# Patient Record
Sex: Male | Born: 1940 | Race: Black or African American | Hispanic: No | Smoking: Former smoker
Health system: Southern US, Community
[De-identification: ages and names within clinical notes are randomized; demographics above are authoritative.]

## PROBLEM LIST (undated history)

## (undated) DIAGNOSIS — N183 Chronic kidney disease, stage 3 unspecified: Secondary | ICD-10-CM

## (undated) DIAGNOSIS — B965 Pseudomonas (aeruginosa) (mallei) (pseudomallei) as the cause of diseases classified elsewhere: Secondary | ICD-10-CM

## (undated) DIAGNOSIS — E119 Type 2 diabetes mellitus without complications: Secondary | ICD-10-CM

## (undated) DIAGNOSIS — J9611 Chronic respiratory failure with hypoxia: Secondary | ICD-10-CM

## (undated) DIAGNOSIS — R609 Edema, unspecified: Secondary | ICD-10-CM

## (undated) DIAGNOSIS — K649 Unspecified hemorrhoids: Secondary | ICD-10-CM

## (undated) DIAGNOSIS — D735 Infarction of spleen: Secondary | ICD-10-CM

## (undated) DIAGNOSIS — D500 Iron deficiency anemia secondary to blood loss (chronic): Secondary | ICD-10-CM

## (undated) DIAGNOSIS — I712 Thoracic aortic aneurysm, without rupture: Secondary | ICD-10-CM

## (undated) DIAGNOSIS — I10 Essential (primary) hypertension: Secondary | ICD-10-CM

## (undated) DIAGNOSIS — G4733 Obstructive sleep apnea (adult) (pediatric): Secondary | ICD-10-CM

## (undated) DIAGNOSIS — N390 Urinary tract infection, site not specified: Secondary | ICD-10-CM

## (undated) DIAGNOSIS — R0981 Nasal congestion: Secondary | ICD-10-CM

## (undated) DIAGNOSIS — Z87448 Personal history of other diseases of urinary system: Secondary | ICD-10-CM

## (undated) DIAGNOSIS — D471 Chronic myeloproliferative disease: Secondary | ICD-10-CM

## (undated) DIAGNOSIS — K909 Intestinal malabsorption, unspecified: Secondary | ICD-10-CM

## (undated) DIAGNOSIS — I5032 Chronic diastolic (congestive) heart failure: Secondary | ICD-10-CM

## (undated) DIAGNOSIS — I2510 Atherosclerotic heart disease of native coronary artery without angina pectoris: Secondary | ICD-10-CM

## (undated) DIAGNOSIS — Z862 Personal history of diseases of the blood and blood-forming organs and certain disorders involving the immune mechanism: Secondary | ICD-10-CM

## (undated) DIAGNOSIS — R0602 Shortness of breath: Secondary | ICD-10-CM

## (undated) DIAGNOSIS — G43909 Migraine, unspecified, not intractable, without status migrainosus: Secondary | ICD-10-CM

## (undated) DIAGNOSIS — D649 Anemia, unspecified: Secondary | ICD-10-CM

## (undated) DIAGNOSIS — I272 Pulmonary hypertension, unspecified: Secondary | ICD-10-CM

## (undated) DIAGNOSIS — N12 Tubulo-interstitial nephritis, not specified as acute or chronic: Secondary | ICD-10-CM

## (undated) HISTORY — DX: Personal history of diseases of the blood and blood-forming organs and certain disorders involving the immune mechanism: Z86.2

## (undated) HISTORY — DX: Anemia, unspecified: D64.9

## (undated) HISTORY — DX: Essential (primary) hypertension: I10

## (undated) HISTORY — DX: Atherosclerotic heart disease of native coronary artery without angina pectoris: I25.10

## (undated) HISTORY — DX: Intestinal malabsorption, unspecified: K90.9

## (undated) HISTORY — DX: Iron deficiency anemia secondary to blood loss (chronic): D50.0

## (undated) HISTORY — DX: Edema, unspecified: R60.9

## (undated) HISTORY — PX: TOE SURGERY: SHX1073

## (undated) HISTORY — DX: Unspecified hemorrhoids: K64.9

## (undated) HISTORY — DX: Obstructive sleep apnea (adult) (pediatric): G47.33

---

## 2004-08-04 ENCOUNTER — Inpatient Hospital Stay (HOSPITAL_COMMUNITY): Admission: EM | Admit: 2004-08-04 | Discharge: 2004-08-08 | Payer: Self-pay | Admitting: Emergency Medicine

## 2004-08-05 ENCOUNTER — Encounter (INDEPENDENT_AMBULATORY_CARE_PROVIDER_SITE_OTHER): Payer: Self-pay | Admitting: *Deleted

## 2004-10-17 ENCOUNTER — Ambulatory Visit: Payer: Self-pay | Admitting: Internal Medicine

## 2004-12-15 ENCOUNTER — Ambulatory Visit: Payer: Self-pay | Admitting: Internal Medicine

## 2005-03-16 ENCOUNTER — Ambulatory Visit: Payer: Self-pay | Admitting: Internal Medicine

## 2005-06-16 ENCOUNTER — Ambulatory Visit: Payer: Self-pay | Admitting: Internal Medicine

## 2005-09-14 ENCOUNTER — Ambulatory Visit: Payer: Self-pay | Admitting: Internal Medicine

## 2005-12-16 ENCOUNTER — Ambulatory Visit: Payer: Self-pay | Admitting: Internal Medicine

## 2006-03-09 ENCOUNTER — Emergency Department (HOSPITAL_COMMUNITY): Admission: EM | Admit: 2006-03-09 | Discharge: 2006-03-09 | Payer: Self-pay | Admitting: Emergency Medicine

## 2006-03-18 ENCOUNTER — Ambulatory Visit: Payer: Self-pay | Admitting: Internal Medicine

## 2006-04-05 ENCOUNTER — Ambulatory Visit: Payer: Self-pay | Admitting: Internal Medicine

## 2006-04-05 LAB — CONVERTED CEMR LAB
Creatinine, Ser: 1.2 mg/dL (ref 0.4–1.5)
Creatinine,U: 125.1 mg/dL
HCT: 33.4 % — ABNORMAL LOW (ref 39.0–52.0)
HDL: 44 mg/dL (ref >39.0)
Hemoglobin: 11 g/dL — ABNORMAL LOW (ref 13.0–17.0)
MCHC: 32.9 g/dL (ref 30.0–36.0)
MCV: 89.1 fL (ref 78.0–100.0)
Microalb Creat Ratio: 2.4 mg/g (ref 0.0–30.0)
Microalb, Ur: 0.3 mg/dL (ref 0.0–1.9)
Potassium: 3.9 meq/L (ref 3.5–5.1)
Pro B Natriuretic peptide (BNP): 132 pg/mL — ABNORMAL HIGH (ref 0.0–100.0)
VLDL: 16 mg/dL (ref 0–40)
WBC: 6.9 10*3/uL (ref 4.5–10.5)

## 2006-04-12 ENCOUNTER — Ambulatory Visit: Payer: Self-pay | Admitting: Cardiovascular Disease

## 2006-04-26 ENCOUNTER — Ambulatory Visit: Payer: Self-pay

## 2006-05-06 ENCOUNTER — Ambulatory Visit: Payer: Self-pay | Admitting: Cardiovascular Disease

## 2006-06-24 ENCOUNTER — Ambulatory Visit: Payer: Self-pay | Admitting: Internal Medicine

## 2006-09-07 DIAGNOSIS — J309 Allergic rhinitis, unspecified: Secondary | ICD-10-CM

## 2006-09-07 DIAGNOSIS — D7581 Myelofibrosis: Secondary | ICD-10-CM

## 2006-09-07 DIAGNOSIS — I2510 Atherosclerotic heart disease of native coronary artery without angina pectoris: Secondary | ICD-10-CM

## 2006-09-07 DIAGNOSIS — G4733 Obstructive sleep apnea (adult) (pediatric): Secondary | ICD-10-CM

## 2006-10-21 ENCOUNTER — Ambulatory Visit: Payer: Self-pay | Admitting: Internal Medicine

## 2006-10-25 LAB — CONVERTED CEMR LAB
Calcium: 9.4 mg/dL (ref 8.4–10.5)
Chloride: 106 meq/L (ref 96–112)
GFR calc Af Amer: 87 mL/min
GFR calc non Af Amer: 72 mL/min
Glucose, Bld: 105 mg/dL — ABNORMAL HIGH (ref 70–99)
HCT: 34.2 % — ABNORMAL LOW (ref 39.0–52.0)
Iron: 62 ug/dL (ref 42–165)
Lymphocytes Relative: 23.7 % (ref 12.0–46.0)
Neutrophils Relative %: 67.4 % (ref 43.0–77.0)
Potassium: 3.8 meq/L (ref 3.5–5.1)
RDW: 17.6 % — ABNORMAL HIGH (ref 11.5–14.6)
Sodium: 141 meq/L (ref 135–145)

## 2006-12-27 ENCOUNTER — Telehealth (INDEPENDENT_AMBULATORY_CARE_PROVIDER_SITE_OTHER): Payer: Self-pay | Admitting: *Deleted

## 2007-01-20 ENCOUNTER — Ambulatory Visit: Payer: Self-pay | Admitting: Internal Medicine

## 2007-02-11 ENCOUNTER — Ambulatory Visit: Payer: Self-pay | Admitting: Internal Medicine

## 2007-02-11 LAB — CONVERTED CEMR LAB
OCCULT 1: NEGATIVE
OCCULT 2: NEGATIVE
OCCULT 3: NEGATIVE

## 2007-02-14 ENCOUNTER — Encounter (INDEPENDENT_AMBULATORY_CARE_PROVIDER_SITE_OTHER): Payer: Self-pay | Admitting: *Deleted

## 2007-02-17 ENCOUNTER — Ambulatory Visit: Payer: Self-pay | Admitting: Internal Medicine

## 2007-03-17 ENCOUNTER — Ambulatory Visit: Payer: Self-pay | Admitting: Internal Medicine

## 2007-04-19 ENCOUNTER — Ambulatory Visit: Payer: Self-pay | Admitting: Internal Medicine

## 2007-05-05 ENCOUNTER — Ambulatory Visit: Payer: Self-pay | Admitting: Internal Medicine

## 2007-05-05 LAB — CONVERTED CEMR LAB
CO2: 28 meq/L (ref 19–32)
Calcium: 9.7 mg/dL (ref 8.4–10.5)
Sodium: 138 meq/L (ref 135–145)

## 2007-06-10 ENCOUNTER — Emergency Department (HOSPITAL_COMMUNITY): Admission: EM | Admit: 2007-06-10 | Discharge: 2007-06-10 | Payer: Self-pay | Admitting: Emergency Medicine

## 2007-07-21 ENCOUNTER — Ambulatory Visit: Payer: Self-pay | Admitting: Internal Medicine

## 2007-10-17 ENCOUNTER — Telehealth (INDEPENDENT_AMBULATORY_CARE_PROVIDER_SITE_OTHER): Payer: Self-pay | Admitting: *Deleted

## 2007-10-27 ENCOUNTER — Ambulatory Visit: Payer: Self-pay | Admitting: Internal Medicine

## 2007-11-28 ENCOUNTER — Telehealth (INDEPENDENT_AMBULATORY_CARE_PROVIDER_SITE_OTHER): Payer: Self-pay | Admitting: *Deleted

## 2008-02-02 ENCOUNTER — Ambulatory Visit: Payer: Self-pay | Admitting: Internal Medicine

## 2008-02-02 LAB — CONVERTED CEMR LAB
BUN: 30 mg/dL — ABNORMAL HIGH (ref 6–23)
CO2: 27 meq/L (ref 19–32)
Chloride: 105 meq/L (ref 96–112)
Creatinine, Ser: 1.3 mg/dL (ref 0.4–1.5)
GFR calc Af Amer: 71 mL/min
GFR calc non Af Amer: 59 mL/min
Glucose, Bld: 99 mg/dL (ref 70–99)

## 2008-02-08 ENCOUNTER — Encounter (INDEPENDENT_AMBULATORY_CARE_PROVIDER_SITE_OTHER): Payer: Self-pay | Admitting: *Deleted

## 2008-03-21 ENCOUNTER — Telehealth (INDEPENDENT_AMBULATORY_CARE_PROVIDER_SITE_OTHER): Payer: Self-pay | Admitting: *Deleted

## 2008-03-28 ENCOUNTER — Ambulatory Visit: Payer: Self-pay | Admitting: Cardiovascular Disease

## 2008-05-31 ENCOUNTER — Ambulatory Visit: Payer: Self-pay | Admitting: Internal Medicine

## 2008-06-08 ENCOUNTER — Encounter (INDEPENDENT_AMBULATORY_CARE_PROVIDER_SITE_OTHER): Payer: Self-pay | Admitting: *Deleted

## 2008-06-08 LAB — CONVERTED CEMR LAB
Creatinine,U: 159.3 mg/dL
Microalb, Ur: 0.5 mg/dL (ref 0.0–1.9)

## 2008-06-21 ENCOUNTER — Telehealth (INDEPENDENT_AMBULATORY_CARE_PROVIDER_SITE_OTHER): Payer: Self-pay | Admitting: *Deleted

## 2008-09-27 ENCOUNTER — Ambulatory Visit: Payer: Self-pay | Admitting: Internal Medicine

## 2008-10-03 LAB — CONVERTED CEMR LAB
BUN: 35 mg/dL — ABNORMAL HIGH
CO2: 27 meq/L
Calcium: 9.6 mg/dL
Chloride: 106 meq/L
Creatinine, Ser: 1.2 mg/dL
GFR calc non Af Amer: 77.7 mL/min
Glucose, Bld: 103 mg/dL — ABNORMAL HIGH
Potassium: 4 meq/L
Sodium: 138 meq/L

## 2009-01-03 ENCOUNTER — Ambulatory Visit: Payer: Self-pay | Admitting: Internal Medicine

## 2009-01-04 ENCOUNTER — Encounter (INDEPENDENT_AMBULATORY_CARE_PROVIDER_SITE_OTHER): Payer: Self-pay | Admitting: *Deleted

## 2009-01-04 LAB — CONVERTED CEMR LAB
Basophils Relative: 0.6 % (ref 0.0–3.0)
Cholesterol: 151 mg/dL (ref 0–200)
Eosinophils Relative: 2 % (ref 0.0–5.0)
Lymphocytes Relative: 31.8 % (ref 12.0–46.0)
Monocytes Relative: 8 % (ref 3.0–12.0)
Neutrophils Relative %: 57.6 % (ref 43.0–77.0)
Platelets: 228 10*3/uL (ref 150.0–400.0)
RBC: 3.99 M/uL — ABNORMAL LOW (ref 4.22–5.81)
Saturation Ratios: 23.2 % (ref 20.0–50.0)
TSH: 0.75 microintl units/mL (ref 0.35–5.50)
Transferrin: 193.7 mg/dL — ABNORMAL LOW (ref 212.0–360.0)

## 2009-01-30 ENCOUNTER — Ambulatory Visit: Payer: Self-pay | Admitting: Internal Medicine

## 2009-02-05 ENCOUNTER — Encounter (INDEPENDENT_AMBULATORY_CARE_PROVIDER_SITE_OTHER): Payer: Self-pay | Admitting: *Deleted

## 2009-04-18 ENCOUNTER — Emergency Department (HOSPITAL_COMMUNITY): Admission: EM | Admit: 2009-04-18 | Discharge: 2009-04-18 | Payer: Self-pay | Admitting: Emergency Medicine

## 2009-04-25 ENCOUNTER — Ambulatory Visit: Payer: Self-pay | Admitting: Internal Medicine

## 2009-04-29 DIAGNOSIS — I517 Cardiomegaly: Secondary | ICD-10-CM

## 2009-04-30 ENCOUNTER — Ambulatory Visit: Payer: Self-pay | Admitting: Cardiovascular Disease

## 2009-04-30 ENCOUNTER — Encounter (INDEPENDENT_AMBULATORY_CARE_PROVIDER_SITE_OTHER): Payer: Self-pay | Admitting: *Deleted

## 2009-04-30 LAB — CONVERTED CEMR LAB
Creatinine, Ser: 1.3 mg/dL (ref 0.4–1.5)
Sodium: 140 meq/L (ref 135–145)

## 2009-06-04 ENCOUNTER — Encounter: Payer: Self-pay | Admitting: Cardiovascular Disease

## 2009-06-04 ENCOUNTER — Ambulatory Visit (HOSPITAL_COMMUNITY): Admission: RE | Admit: 2009-06-04 | Discharge: 2009-06-04 | Payer: Self-pay | Admitting: Cardiovascular Disease

## 2009-06-04 ENCOUNTER — Ambulatory Visit: Payer: Self-pay | Admitting: Cardiology

## 2009-06-04 ENCOUNTER — Ambulatory Visit: Payer: Self-pay

## 2009-08-29 ENCOUNTER — Ambulatory Visit: Payer: Self-pay | Admitting: Internal Medicine

## 2010-01-02 ENCOUNTER — Ambulatory Visit: Payer: Self-pay | Admitting: Internal Medicine

## 2010-01-02 DIAGNOSIS — R519 Headache, unspecified: Secondary | ICD-10-CM | POA: Insufficient documentation

## 2010-01-02 DIAGNOSIS — R51 Headache: Secondary | ICD-10-CM

## 2010-01-07 ENCOUNTER — Encounter (INDEPENDENT_AMBULATORY_CARE_PROVIDER_SITE_OTHER): Payer: Self-pay | Admitting: *Deleted

## 2010-01-08 LAB — CONVERTED CEMR LAB
CO2: 27 meq/L (ref 19–32)
Calcium: 9.4 mg/dL (ref 8.4–10.5)
Chloride: 106 meq/L (ref 96–112)
Cholesterol: 143 mg/dL (ref 0–200)
Creatinine, Ser: 1.1 mg/dL (ref 0.4–1.5)
Eosinophils Relative: 1.7 % (ref 0.0–5.0)
HCT: 35 % — ABNORMAL LOW (ref 39.0–52.0)
Hgb A1c MFr Bld: 5.8 % (ref 4.6–6.5)
LDL Cholesterol: 77 mg/dL (ref 0–99)
Lymphocytes Relative: 24.7 % (ref 12.0–46.0)
Lymphs Abs: 2 10*3/uL (ref 0.7–4.0)
MCV: 87.9 fL (ref 78.0–100.0)
Monocytes Relative: 10.6 % (ref 3.0–12.0)
PSA: 0.87 ng/mL (ref 0.10–4.00)
Platelets: 289 10*3/uL (ref 150.0–400.0)
Potassium: 4.2 meq/L (ref 3.5–5.1)
RBC: 3.98 M/uL — ABNORMAL LOW (ref 4.22–5.81)

## 2010-01-14 ENCOUNTER — Telehealth (INDEPENDENT_AMBULATORY_CARE_PROVIDER_SITE_OTHER): Payer: Self-pay | Admitting: *Deleted

## 2010-01-27 ENCOUNTER — Ambulatory Visit: Payer: Self-pay | Admitting: Internal Medicine

## 2010-05-01 ENCOUNTER — Encounter: Payer: Self-pay | Admitting: Cardiovascular Disease

## 2010-05-01 ENCOUNTER — Ambulatory Visit: Payer: Self-pay | Admitting: Cardiovascular Disease

## 2010-05-08 ENCOUNTER — Ambulatory Visit: Payer: Self-pay | Admitting: Internal Medicine

## 2010-06-15 LAB — CONVERTED CEMR LAB
ALT: 12 units/L (ref 0–53)
AST: 17 units/L (ref 0–37)
Eosinophils Absolute: 0.1 10*3/uL (ref 0.0–0.6)
Eosinophils Relative: 1.8 % (ref 0.0–5.0)
Folate: 7.8 ng/mL
HCT: 35.8 % — ABNORMAL LOW (ref 39.0–52.0)
HDL: 43 mg/dL (ref >39.0)
Hgb A1c MFr Bld: 5.9 % (ref 4.6–6.0)
Iron: 64 ug/dL (ref 42–165)
LDL Cholesterol: 80 mg/dL (ref 0–99)
Neutro Abs: 4.4 10*3/uL (ref 1.4–7.7)
Neutrophils Relative %: 62.4 % (ref 43.0–77.0)
RBC: 4.05 M/uL — ABNORMAL LOW (ref 4.22–5.81)
TSH: 0.85 microintl units/mL (ref 0.35–5.50)
Total CHOL/HDL Ratio: 3.2
Triglycerides: 80 mg/dL (ref 0–149)
WBC: 7 10*3/uL (ref 4.5–10.5)

## 2010-06-17 NOTE — Assessment & Plan Note (Signed)
Summary: yearly--/kdc   Vital Signs:  Patient profile:   70 year old male Weight:      280.25 pounds Pulse rate:   72 / minute Pulse rhythm:   regular BP sitting:   128 / 86  (left arm) Cuff size:   large  Vitals Entered By: Allyn Kenner CMA (January 02, 2010 9:10 AM) CC: 4 month f/u: fasting Comments left side of jaw very sore having difficulty eating    History of Present Illness: routine office visit  left side of jaw very sore having difficulty eating, he points to the angle of the left jaw Has not noticed any masses on self-examination  DIABETES -- stated trying to eat sugar free ice cream, eating more salads   HTN-- good medication compliance , rarely checks  ambulatory BPs, told one time it was ok  ALLERGIC RHINITIS -- nasonex$$$, change med       Current Medications (verified): 1)  Agrylin 0.5 Mg Caps (Anagrelide Hcl) .... Take 2 Tablets Alt With 3 Tablets 2)  Nasonex 50 Mcg/act Susp (Mometasone Furoate) .... Spray 2 Spray As Directed Once A Day 3)  Lasix 40 Mg Tabs (Furosemide) .... 3 Tablet By Mouth Once A Day 4)  Klor-Con 20 Meq Pack (Potassium Chloride) .Marland Kitchen.. 1 By Mouth Once Daily 5)  Tylenol 325 Mg Tabs (Acetaminophen) .Marland Kitchen.. 1-4 By Mouth Once Daily As Needed For Headache 6)  Lisinopril 20 Mg  Tabs (Lisinopril) .Marland Kitchen.. 1 By Mouth Qd  Allergies: 1)  * No Beta Blockers Per Cardiology  Past History:  Past Medical History: h/o CORONARY ARTERY DISEASE ,dx elsewhere in the past,  no record of this.  Non-ischemic myovue 2007 h/o CONGESTIVE HEART FAILURE, dx elesewhere  Normal EF by previous echo.  Trivial AS needs F/U echo by 2012 DIABETES  HTN Hx of SLEEP APNEA, OBSTRUCTIVE -- at some point used CPAP, was d/c years ago ANEMIA-NOS Hx of THROMBOCYTOSIS   ALLERGIC RHINITIS   Past Surgical History: Reviewed history from 05/31/2008 and no changes required. no major surgeries  Family History: Reviewed history from 01/03/2009 and no changes required. colon  ca--no prostate ca--no MI--no DM--no  Social History: moved from [place] 2006 widow 2007 lives w/ son retired diet-- trying to eat healthy  exercise-- very limited   tobacco-- quit [place]-- no  Review of Systems CV:  Denies chest pain or discomfort; mild LE edema at baseline . Resp:  occasionally cough SOB at baseline . GI:  Denies bloody stools, nausea, and vomiting. GU:  Denies dysuria, hematuria, and urinary frequency; occasionally hesitancy.  Physical Exam  General:  alert and well-developed.   Head:  face symmetric No tender to palpation in the jaw, carotids glands symmetric TMJ without click Mouth:  no evidence of a gum abscess by physical exam, percussion of reamining L teeth negative Neck:  no mass or lymphadenopathy is Lungs:  normal respiratory effort, no intercostal retractions, no accessory muscle use, and normal breath sounds.   Heart:  normal rate, regular rhythm, and no murmur.   Abdomen:  soft, non-tender, no distention, no masses, no guarding, and no rigidity.   Rectal:  No external abnormalities noted. Normal sphincter tone. No rectal masses or tenderness. Brown stool Hemoccult negative Prostate:  Prostate gland firm and smooth, no enlargement, nodularity, tenderness, mass, asymmetry or induration. Extremities:  no lower extremity edema   Impression & Recommendations:  Problem # 1:  FACIAL PAIN (ICD-784.0) complaining of pain in the jaw for one month Exam is unrevealing  Recommend to   see his dentist Reassess in 4 months His updated medication list for this problem includes:    Tylenol 325 Mg Tabs (Acetaminophen) .Marland Kitchen... 1-4 by mouth once daily as needed for headache  Problem # 2:  [place] (ICD-V70.0) Pneumonia shot 11-07 Td 08-2009  Cscope (incomplete) in  2005 and f/u by  BE: Dx w/  TICS  (done elsewhere)  iFOB neg 2010 Hemoccult negative today We talked about a repeat the colonoscopy, likes to wait till next year iFOB provided      Problem # 3:  DIABETES MELLITUS, TYPE II (ICD-250.00) on diet only His updated medication list for this problem includes:    Lisinopril 20 Mg Tabs (Lisinopril) .Marland Kitchen... 1 by mouth qd  Labs Reviewed: Creat: 1.3 (04/25/2009)    Reviewed HgBA1c results: 5.7 (01/03/2009)  5.9 (05/31/2008)  Orders: TLB-A1C / Hgb A1C (Glycohemoglobin) (83036-A1C)  Problem # 4:  ANEMIA-NOS (ICD-285.9) chronic, stable, no iron deficiency anemia. Labs      Orders: TLB-CBC Platelet - w/Differential (85025-CBCD) Venipuncture [id number]) Specimen Handling (99000)  Problem # 5:  CORONARY ARTERY DISEASE (ICD-414.00) asymptomatic His updated medication list for this problem includes:    Agrylin 0.5 Mg Caps (Anagrelide hcl) .Marland Kitchen... Take 2 tablets alt with 3 tablets    Lasix 40 Mg Tabs (Furosemide) .Marland KitchenMarland KitchenMarland KitchenMarland Kitchen 3 tablet by mouth once a day    Lisinopril 20 Mg Tabs (Lisinopril) .Marland Kitchen... 1 by mouth qd    Labs Reviewed: Chol: 151 (01/03/2009)   HDL: 50.10 (01/03/2009)   LDL: 87 (01/03/2009)   TG: 70.0 (01/03/2009)  Orders: TLB-Lipid Panel (80061-LIPID) Specimen Handling (99000)  Problem # 6:  HYPERTENSION (ICD-401.9) at goal  His updated medication list for this problem includes:    Lasix 40 Mg Tabs (Furosemide) .Marland KitchenMarland KitchenMarland KitchenMarland Kitchen 3 tablet by mouth once a day    Lisinopril 20 Mg Tabs (Lisinopril) .Marland Kitchen... 1 by mouth qd    BP today: 128/86 Prior BP: 126/70 (08/29/2009)  Labs Reviewed: K+: 4.2 (04/25/2009) Creat: : 1.3 (04/25/2009)   Chol: 151 (01/03/2009)   HDL: 50.10 (01/03/2009)   LDL: 87 (01/03/2009)   TG: 70.0 (01/03/2009)  Orders: TLB-BMP (Basic Metabolic Panel-BMET) (99991111) Specimen Handling (99000)  Problem # 7:  ALLERGIC RHINITIS (ICD-477.9) d/t cost will change meds  His updated medication list for this problem includes:    Fluticasone Propionate 50 Mcg/act Susp (Fluticasone propionate) .Marland Kitchen... 2 sprays on each side of the nose once daily  Complete Medication List: 1)  Agrylin 0.5 Mg Caps (Anagrelide  hcl) .... Take 2 tablets alt with 3 tablets 2)  Fluticasone Propionate 50 Mcg/act Susp (Fluticasone propionate) .... 2 sprays on each side of the nose once daily 3)  Lasix 40 Mg Tabs (Furosemide) .... 3 tablet by mouth once a day 4)  Klor-con 20 Meq Pack (Potassium chloride) .Marland Kitchen.. 1 by mouth once daily 5)  Tylenol 325 Mg Tabs (Acetaminophen) .Marland Kitchen.. 1-4 by mouth once daily as needed for headache 6)  Lisinopril 20 Mg Tabs (Lisinopril) .Marland Kitchen.. 1 by mouth qd  Other Orders: TLB-PSA (Prostate Specific Antigen) (84153-PSA)  Patient Instructions: 1)  please see your dentist in reference to the pain in your jaw 2)  Please schedule a follow-up appointment in 4 months .  Prescriptions: FLUTICASONE PROPIONATE 50 MCG/ACT SUSP (FLUTICASONE PROPIONATE) 2 sprays on each side of the nose once daily  #1 x 6   Entered and Authorized by:   Alda Berthold. Akoni Parton MD   Signed by:   Alda Berthold. Hamdan Toscano MD on 01/02/2010  Method used:   Print then Give to Patient   RxID:   [id number]

## 2010-06-17 NOTE — Progress Notes (Signed)
Summary: ifob  Phone Note From Other Clinic   Summary of Call: Per  Caller: Venetia Constable Summary of Call: Per [name] at [facility] pt needs to redo his iFob kit. I spoke with pt and informed him we are currently out of kits and I would call him once we got some in. Pt understood. Sparkill  January 14, 2010 12:39 PM

## 2010-06-17 NOTE — Assessment & Plan Note (Signed)
Summary: 4 MONTH OV//PH   Vital Signs:  Patient profile:   70 year old male Height:      73 inches Weight:      273 pounds BMI:     36.15 Pulse rate:   74 / minute BP sitting:   126 / 70  Vitals Entered By: Dawson Bills (August 29, 2009 8:20 AM) CC: rov, not fasting   History of Present Illness: ROV no new concerns  "I take it easy"   Current Medications (verified): 1)  Agrylin 0.5 Mg Caps (Anagrelide Hcl) .... Take 2 Tablets Alt With 3 Tablets 2)  Nasonex 50 Mcg/act Susp (Mometasone Furoate) .... Spray 2 Spray As Directed Once A Day 3)  Lasix 40 Mg Tabs (Furosemide) .... 3 Tablet By Mouth Once A Day 4)  Klor-Con 20 Meq Pack (Potassium Chloride) .Marland Kitchen.. 1 By Mouth Once Daily 5)  Tylenol 325 Mg Tabs (Acetaminophen) .Marland Kitchen.. 1-4 By Mouth Once Daily As Needed For Headache 6)  Lisinopril 20 Mg  Tabs (Lisinopril) .Marland Kitchen.. 1 By Mouth Qd  Allergies (verified): 1)  * No Beta Blockers Per Cardiology  Past History:  Past Medical History: h/o CORONARY ARTERY DISEASE ,dx elsewhere in the past,  no record of this.  Non-ischemic myovue 2007 h/o CONGESTIVE HEART FAILURE, dx elesewhere  Normal EF by previous echo.  Trivial AS needs F/U echo by 2012 DIABETES  HTN Hx of SLEEP APNEA, OBSTRUCTIVE  ANEMIA-NOS Hx of THROMBOCYTOSIS   ALLERGIC RHINITIS   Past Surgical History: Reviewed history from 05/31/2008 and no changes required. no major surgeries  Social History: Reviewed history from 01/03/2009 and no changes required. moved from [place] 2006 widow 2007 lives w/ son retired diet-- trying to eat healthy  exercise-- very limited    Review of Systems CV:  Denies chest pain or discomfort; SOB-DOE, at baseline . Resp:  Denies wheezing; occasionally coughs . Endo:  occasionally checks his sugars , usually "ok" .  Physical Exam  General:  alert and well-developed.   Lungs:  normal respiratory effort, no intercostal retractions, no accessory muscle use, and normal breath sounds.   Heart:   normal rate, regular rhythm, and no murmur.   Extremities:  no pretibial edema bilaterally    Impression & Recommendations:  Problem # 1:  HYPERTENSION (ICD-401.9) at goal  His updated medication list for this problem includes:    Lasix 40 Mg Tabs (Furosemide) .Marland KitchenMarland KitchenMarland KitchenMarland Kitchen 3 tablet by mouth once a day    Lisinopril 20 Mg Tabs (Lisinopril) .Marland Kitchen... 1 by mouth qd  BP today: 126/70 Prior BP: 105/64 (04/30/2009)  Labs Reviewed: K+: 4.2 (04/25/2009) Creat: : 1.3 (04/25/2009)   Chol: 151 (01/03/2009)   HDL: 50.10 (01/03/2009)   LDL: 87 (01/03/2009)   TG: 70.0 (01/03/2009)  Problem # 2:  DIABETES MELLITUS, TYPE II (ICD-250.00) well-controlled,   diet only His updated medication list for this problem includes:    Lisinopril 20 Mg Tabs (Lisinopril) .Marland Kitchen... 1 by mouth qd  Labs Reviewed: Creat: 1.3 (04/25/2009)    Reviewed HgBA1c results: 5.7 (01/03/2009)  5.9 (05/31/2008)  Problem # 3:  ANEMIA-NOS (ICD-285.9) hemoglobin stable, no iron def , last Hemoccult negative  Problem # 4:  CONGESTIVE HEART FAILURE (ICD-428.0) last echo 1/11, good. His updated medication list for this problem includes:    Agrylin 0.5 Mg Caps (Anagrelide hcl) .Marland Kitchen... Take 2 tablets alt with 3 tablets    Lasix 40 Mg Tabs (Furosemide) .Marland KitchenMarland KitchenMarland KitchenMarland Kitchen 3 tablet by mouth once a day    Lisinopril 20 Mg  Tabs (Lisinopril) .Marland Kitchen... 1 by mouth qd  Complete Medication List: 1)  Agrylin 0.5 Mg Caps (Anagrelide hcl) .... Take 2 tablets alt with 3 tablets 2)  Nasonex 50 Mcg/act Susp (Mometasone furoate) .... Spray 2 spray as directed once a day 3)  Lasix 40 Mg Tabs (Furosemide) .... 3 tablet by mouth once a day 4)  Klor-con 20 Meq Pack (Potassium chloride) .Marland Kitchen.. 1 by mouth once daily 5)  Tylenol 325 Mg Tabs (Acetaminophen) .Marland Kitchen.. 1-4 by mouth once daily as needed for headache 6)  Lisinopril 20 Mg Tabs (Lisinopril) .Marland Kitchen.. 1 by mouth qd  Other Orders: TD Toxoids IM 7 YR + [id number]) Admin 1st Vaccine [phone]) Admin 1st Vaccine [facility]) [phone])  Patient  Instructions: 1)  Please schedule a follow-up appointment in 4 to 5 months  (fasting) Prescriptions: LISINOPRIL 20 MG  TABS (LISINOPRIL) 1 by mouth QD  #30 Each x 6   Entered by:   Dawson Bills   Authorized by:   Alda Berthold. Paz MD   Signed by:   Dawson Bills on 08/29/2009   Method used:   Print then Give to Patient   RxID:   [id number]    Tetanus/Td Vaccine    Vaccine Type: Td    Site: left deltoid    Mfr: Frederickson    Dose: 0.5 ml    Route: IM    Given by: Dawson Bills    Exp. Date: 08/21/2010    Lot #: [id number]      Preventive Care Screening  Prior Values:    PSA:  0.89 (01/03/2009)    Colonoscopy:  Done (02/16/2004)    Last Tetanus Booster:  declined (07/21/2007)    Last Flu Shot:  Fluvax 3+ (04/25/2009)

## 2010-06-17 NOTE — Letter (Signed)
Summary: [facility]: Immunoassay Fecal Occult Blood (iFOB) Order Form  [facility] [place]   [place], [place] [address]   Phone: [phone]  Fax: [phone]      [facility]: Immunoassay Fecal Occult Blood (iFOB) Order Form   January 07, 2010 MRN: [id number]   Juan [name] [birth year]   Physicican Name:________jose paz, md_________________  Diagnosis Code:_____v76.51_____________________      Allyn Kenner CMA

## 2010-06-19 NOTE — Assessment & Plan Note (Signed)
Summary: 4 MONTH FOLLOWUP/KN   Vital Signs:  Patient profile:   70 year old male Weight:      281 pounds O2 Sat:      95 % on Room air Pulse rate:   82 / minute Pulse rhythm:   regular BP sitting:   122 / 64  (left arm) Cuff size:   large  Vitals Entered By: Allyn Kenner CMA (May 08, 2010 8:13 AM)  O2 Flow:  Room air CC: 4 month f/u- not fasting  Comments still tired and weak    History of Present Illness: ROV no problems reported "I take it easy"  Current Medications (verified): 1)  Agrylin 0.5 Mg Caps (Anagrelide Hcl) .... Take 2 Tablets Alt With 3 Tablets 2)  Fluticasone Propionate 50 Mcg/act Susp (Fluticasone Propionate) .... 2 Sprays On Each Side of The Nose Once Daily 3)  Lasix 40 Mg Tabs (Furosemide) .... 3 Tablet By Mouth Once A Day 4)  Klor-Con 20 Meq Pack (Potassium Chloride) .Marland Kitchen.. 1 By Mouth Once Daily 5)  Tylenol 325 Mg Tabs (Acetaminophen) .Marland Kitchen.. 1-4 By Mouth Once Daily As Needed For Headache 6)  Lisinopril 20 Mg  Tabs (Lisinopril) .Marland Kitchen.. 1 By Mouth Qd  Allergies (verified): 1)  * No Beta Blockers Per Cardiology  Past History:  Past Medical History: Reviewed history from 01/02/2010 and no changes required. h/o CORONARY ARTERY DISEASE ,dx elsewhere in the past,  no record of this.  Non-ischemic myovue 2007 h/o CONGESTIVE HEART FAILURE, dx elesewhere  Normal EF by previous echo.  Trivial AS needs F/U echo by 2012 DIABETES  HTN Hx of SLEEP APNEA, OBSTRUCTIVE -- at some point used CPAP, was d/c years ago ANEMIA-NOS Hx of THROMBOCYTOSIS   ALLERGIC RHINITIS   Past Surgical History: Reviewed history from 05/31/2008 and no changes required. no major surgeries  Social History: Reviewed history from 01/02/2010 and no changes required. moved from [place] 2006 widow 2007 lives w/ son retired diet-- trying to eat healthy  exercise-- very limited   tobacco-- quit [place]-- no  Review of Systems CV:  Denies chest pain or discomfort; shortness of breath  at times, symptoms at baseline Lower extremity edema at baseline. Endo:  no amb  CBGs.  Physical Exam  General:  alert and well-developed.   Lungs:  normal respiratory effort, no intercostal retractions, no accessory muscle use, and normal breath sounds.   Heart:  normal rate, regular rhythm, and no murmur.   Extremities:  no lower extremity edema   Impression & Recommendations:  Problem # 1:  DIABETES MELLITUS, TYPE II (ICD-250.00) at goal  His updated medication list for this problem includes:    Lisinopril 20 Mg Tabs (Lisinopril) .Marland Kitchen... 1 by mouth qd  Labs Reviewed: Creat: 1.1 (01/02/2010)    Reviewed HgBA1c results: 5.8 (01/02/2010)  5.7 (01/03/2009)  Problem # 2:  ANEMIA-NOS (ICD-285.9) stable per last CBC  Problem # 3:  CORONARY ARTERY DISEASE (ICD-414.00) No documentation   Asymptomatic His updated medication list for this problem includes:    Agrylin 0.5 Mg Caps (Anagrelide hcl) .Marland Kitchen... Take 2 tablets alt with 3 tablets    Lasix 40 Mg Tabs (Furosemide) .Marland KitchenMarland KitchenMarland KitchenMarland Kitchen 3 tablet by mouth once a day    Lisinopril 20 Mg Tabs (Lisinopril) .Marland Kitchen... 1 by mouth qd  Problem # 4:  HYPERTENSION (ICD-401.9) at goal  His updated medication list for this problem includes:    Lasix 40 Mg Tabs (Furosemide) .Marland KitchenMarland KitchenMarland KitchenMarland Kitchen 3 tablet by mouth once a day  Lisinopril 20 Mg Tabs (Lisinopril) .Marland Kitchen... 1 by mouth qd  BP today: 122/64 Prior BP: 110/60 (05/01/2010)  Labs Reviewed: K+: 4.2 (01/02/2010) Creat: : 1.1 (01/02/2010)   Chol: 143 (01/02/2010)   HDL: 56.70 (01/02/2010)   LDL: 77 (01/02/2010)   TG: 46.0 (01/02/2010)  Problem # 5:  likes to come back every 4 months instead of 6 months  Complete Medication List: 1)  Agrylin 0.5 Mg Caps (Anagrelide hcl) .... Take 2 tablets alt with 3 tablets 2)  Fluticasone Propionate 50 Mcg/act Susp (Fluticasone propionate) .... 2 sprays on each side of the nose once daily 3)  Lasix 40 Mg Tabs (Furosemide) .... 3 tablet by mouth once a day 4)  Klor-con 20 Meq Pack  (Potassium chloride) .Marland Kitchen.. 1 by mouth once daily 5)  Tylenol 325 Mg Tabs (Acetaminophen) .Marland Kitchen.. 1-4 by mouth once daily as needed for headache 6)  Lisinopril 20 Mg Tabs (Lisinopril) .Marland Kitchen.. 1 by mouth qd  Patient Instructions: 1)  Please schedule a follow-up appointment in 4 months .  2)  please get a flu shot in your pharmacy   Orders Added: 1)  Est. Patient Level III [id number]

## 2010-06-19 NOTE — Assessment & Plan Note (Signed)
Summary: f1y    Visit Type:  1 year follow up Primary Christin Mccreedy:  Alda Berthold. Paz MD  CC:  Chest tightness and Sob.  History of Present Illness: Bennette is seen today for F/U of HTN, and edema.  He carries a Dx of CAD and CHF.  There has never been any documentation of this.  He indicated these problems when he moved here from [place]>  He had a normal myovue and echo in 2007.  He is a poor historian but denies PND, orthopnea and his edema is improved.  He has mild chronic fatigue and exertional dyspnea.  He denies cough or sputum.  He has been compliant with his meds.  His son lives with him and has some psycholigical issues.  This is a little stressful for him  Echo 1/11 showed normal EF 55-60% ECG reviewed today NSR 70 Normal  Current Problems (verified): 1)  Facial Pain  (ICD-784.0) 2)  Diabetes Mellitus, Type II  (ICD-250.00) 3)  Hx of Thrombocytosis  (ICD-289.9) 4)  Anemia-nos  (ICD-285.9) 5)  Coronary Artery Disease  (ICD-414.00) 6)  Congestive Heart Failure  (ICD-428.0) 7)  Hypertension  (ICD-401.9) 8)  Cardiomegaly  (ICD-429.3) 9)  Special Screening Malignant Neoplasm of Prostate  (ICD-V76.44) 10)  Health Screening  (ICD-V70.0) 11)  Hx of Sleep Apnea, Obstructive  (ICD-327.23) 12)  Allergic Rhinitis  (ICD-477.9)  Current Medications (verified): 1)  Agrylin 0.5 Mg Caps (Anagrelide Hcl) .... Take 2 Tablets Alt With 3 Tablets 2)  Fluticasone Propionate 50 Mcg/act Susp (Fluticasone Propionate) .... 2 Sprays On Each Side of The Nose Once Daily 3)  Lasix 40 Mg Tabs (Furosemide) .... 3 Tablet By Mouth Once A Day 4)  Klor-Con 20 Meq Pack (Potassium Chloride) .Marland Kitchen.. 1 By Mouth Once Daily 5)  Tylenol 325 Mg Tabs (Acetaminophen) .Marland Kitchen.. 1-4 By Mouth Once Daily As Needed For Headache 6)  Lisinopril 20 Mg  Tabs (Lisinopril) .Marland Kitchen.. 1 By Mouth Qd  Allergies: 1)  * No Beta Blockers Per Cardiology  Past History:  Past Medical History: Last updated: 01/02/2010 h/o CORONARY ARTERY DISEASE ,dx  elsewhere in the past,  no record of this.  Non-ischemic myovue 2007 h/o CONGESTIVE HEART FAILURE, dx elesewhere  Normal EF by previous echo.  Trivial AS needs F/U echo by 2012 DIABETES  HTN Hx of SLEEP APNEA, OBSTRUCTIVE -- at some point used CPAP, was d/c years ago ANEMIA-NOS Hx of THROMBOCYTOSIS   ALLERGIC RHINITIS   Past Surgical History: Last updated: 05/31/2008 no major surgeries  Family History: Last updated: 01/03/2009 colon ca--no prostate ca--no MI--no DM--no  Social History: Last updated: 01/02/2010 moved from [place] 2006 widow 2007 lives w/ son retired diet-- trying to eat healthy  exercise-- very limited   tobacco-- quit 1968 [place]-- no  Review of Systems       Denies fever, malais, weight loss, blurry vision, decreased visual acuity, cough, sputum, SOB, hemoptysis, pleuritic pain, palpitaitons, heartburn, abdominal pain, melena, lower extremity edema, claudication, or rash.   Vital Signs:  Patient profile:   70 year old male Height:      73 inches Weight:      280 pounds BMI:     37.08 Pulse rate:   60 / minute Pulse rhythm:   regular Resp:     20 per minute BP sitting:   110 / 60  (left arm) Cuff size:   large  Vitals Entered By: Sidney Ace (May 01, 2010 10:39 AM)  Physical Exam  General:  Affect appropriate  Healthy:  appears stated age 63: normal Neck supple with no adenopathy JVP normal no bruits no thyromegaly Lungs clear with no wheezing and good diaphragmatic motion Heart:  S1/S2 no murmur,rub, gallop or click PMI normal Abdomen: benighn, BS positve, no tenderness, no AAA no bruit.  No HSM or HJR Distal pulses intact with no bruits No edema Neuro non-focal Skin warm and dry    Impression & Recommendations:  Problem # 1:  CORONARY ARTERY DISEASE (ICD-414.00) No documentation  No SSCP  Consider myovue in future if symptoms arise.  Normal ECG His updated medication list for this problem includes:    Agrylin 0.5 Mg Caps  (Anagrelide hcl) .Marland Kitchen... Take 2 tablets alt with 3 tablets    Lisinopril 20 Mg Tabs (Lisinopril) .Marland Kitchen... 1 by mouth qd  Problem # 2:  CONGESTIVE HEART FAILURE (ICD-428.0) By history systolic function normal by echo 2011  Continue current Rx His updated medication list for this problem includes:    Agrylin 0.5 Mg Caps (Anagrelide hcl) .Marland Kitchen... Take 2 tablets alt with 3 tablets    Lasix 40 Mg Tabs (Furosemide) .Marland KitchenMarland KitchenMarland KitchenMarland Kitchen 3 tablet by mouth once a day    Lisinopril 20 Mg Tabs (Lisinopril) .Marland Kitchen... 1 by mouth qd  Problem # 3:  HYPERTENSION (ICD-401.9) Well contorlled His updated medication list for this problem includes:    Lasix 40 Mg Tabs (Furosemide) .Marland KitchenMarland KitchenMarland KitchenMarland Kitchen 3 tablet by mouth once a day    Lisinopril 20 Mg Tabs (Lisinopril) .Marland Kitchen... 1 by mouth qd

## 2010-07-24 ENCOUNTER — Encounter: Payer: Self-pay | Admitting: Internal Medicine

## 2010-08-13 ENCOUNTER — Other Ambulatory Visit: Payer: Self-pay | Admitting: Internal Medicine

## 2010-09-11 ENCOUNTER — Ambulatory Visit (INDEPENDENT_AMBULATORY_CARE_PROVIDER_SITE_OTHER): Payer: Medicare Other | Admitting: Internal Medicine

## 2010-09-11 ENCOUNTER — Encounter: Payer: Self-pay | Admitting: Internal Medicine

## 2010-09-11 DIAGNOSIS — D759 Disease of blood and blood-forming organs, unspecified: Secondary | ICD-10-CM

## 2010-09-11 DIAGNOSIS — E119 Type 2 diabetes mellitus without complications: Secondary | ICD-10-CM

## 2010-09-11 DIAGNOSIS — I10 Essential (primary) hypertension: Secondary | ICD-10-CM

## 2010-09-11 LAB — BASIC METABOLIC PANEL
CO2: 28 mEq/L (ref 19–32)
Calcium: 9.3 mg/dL (ref 8.4–10.5)
Chloride: 104 mEq/L (ref 96–112)
Glucose, Bld: 78 mg/dL (ref 70–99)
Potassium: 4.3 mEq/L (ref 3.5–5.1)

## 2010-09-11 NOTE — Progress Notes (Signed)
  Subjective:    Patient ID: Juan [name], male    DOB: 1940-08-21, 70 y.o.   MRN: [id number]  HPI ROV "just taking it easy"   Past Medical History  Diagnosis Date  . CAD (coronary artery disease)     dx elsewheer in past, no record of this. Non-ischemic myovue 2007  . CHF (congestive heart failure)     dx elsewhere. Normal EF by previous echo. Trival AS needs f/u ECHO by 2012  . Diabetes mellitus   . Hypertension   . Sleep apnea, obstructive     at some point used CPAP, was d/c  years ago  . Anemia   . History of thrombocytosis   . Allergic rhinitis    No past surgical history on file.   Social History: moved from [place] 2006 widow 2007 lives w/ son retired diet-- trying to eat healthy  exercise-- very limited   tobacco-- quit [place]-- no  Review of Systems C/o tiredness and SOB... at baseline  Very mild LE edema, at baseline  No CP occ joint stifness    Objective:   Physical Exam  Constitutional: Juan [name] is oriented to person, place, and time. Juan [name] appears well-developed and well-nourished.  Cardiovascular: Normal rate and regular rhythm.   No murmur heard. Pulmonary/Chest: Effort normal and breath sounds normal. No respiratory distress. Juan [name] has no wheezes. Juan [name] has no rales.  Musculoskeletal:       Right calf is slightly larger than the left by half inch. Some pitted edema in the right pretibial area. This is a chronic finding on and off according to the patient. No pain  Neurological: Juan [name] is alert and oriented to person, place, and time.  Psychiatric: Juan [name] has a normal mood and affect. His behavior is normal. Judgment and thought content normal.          Assessment & Plan:

## 2010-09-11 NOTE — Assessment & Plan Note (Signed)
H/o essential thrombocytosis, on agrylin

## 2010-09-11 NOTE — Assessment & Plan Note (Signed)
On diet control

## 2010-09-11 NOTE — Assessment & Plan Note (Addendum)
No change, labs  R leg slt larger than L, on-off issue x years per pt, edema responds well to lasix

## 2010-09-16 ENCOUNTER — Other Ambulatory Visit: Payer: Self-pay | Admitting: Internal Medicine

## 2010-09-21 ENCOUNTER — Emergency Department (HOSPITAL_COMMUNITY)
Admission: EM | Admit: 2010-09-21 | Discharge: 2010-09-21 | Disposition: A | Discharge status: Home / Self Care | Payer: Medicare Other | Attending: Emergency Medicine | Admitting: Emergency Medicine

## 2010-09-21 DIAGNOSIS — I2510 Atherosclerotic heart disease of native coronary artery without angina pectoris: Secondary | ICD-10-CM | POA: Insufficient documentation

## 2010-09-21 DIAGNOSIS — M7989 Other specified soft tissue disorders: Secondary | ICD-10-CM | POA: Insufficient documentation

## 2010-09-21 DIAGNOSIS — R609 Edema, unspecified: Secondary | ICD-10-CM | POA: Insufficient documentation

## 2010-09-21 DIAGNOSIS — M79609 Pain in unspecified limb: Secondary | ICD-10-CM

## 2010-09-21 DIAGNOSIS — I10 Essential (primary) hypertension: Secondary | ICD-10-CM | POA: Insufficient documentation

## 2010-09-21 DIAGNOSIS — I872 Venous insufficiency (chronic) (peripheral): Secondary | ICD-10-CM | POA: Insufficient documentation

## 2010-09-21 LAB — CBC
Hemoglobin: 9.9 g/dL — ABNORMAL LOW (ref 13.0–17.0)
MCH: 27.4 pg (ref 26.0–34.0)
MCV: 88.6 fL (ref 78.0–100.0)
WBC: 5.2 10*3/uL (ref 4.0–10.5)

## 2010-09-21 LAB — COMPREHENSIVE METABOLIC PANEL
ALT: 7 U/L (ref 0–53)
AST: 15 U/L (ref 0–37)
Alkaline Phosphatase: 103 U/L (ref 39–117)
Calcium: 9.8 mg/dL (ref 8.4–10.5)
GFR calc Af Amer: >60 mL/min (ref >60)
GFR calc non Af Amer: >60 mL/min (ref >60)
Potassium: 4.5 mEq/L (ref 3.5–5.1)
Sodium: 136 mEq/L (ref 135–145)

## 2010-09-21 LAB — D-DIMER, QUANTITATIVE: D-Dimer, Quant: 0.38 ug/mL-FEU (ref 0.00–0.48)

## 2010-09-30 NOTE — Assessment & Plan Note (Signed)
[facility] OFFICE NOTE   NAME:BELLColon, Delaossa                           MRN:          [id number]  DATE:03/28/2008                            DOB:          [birth year]    Mr. Sorg is seen today in followup.  He is an overweight black male who  I have seen in the past for shortness of breath.  He has very trivial  aortic stenosis.  He has central obesity.  His primary care doctor is  Dr. Larose Kells.  In talking to Way[place], he continues to complain of fatigue and  dyspnea.  Again this seems functional.  His ejection fraction was normal  by echo.  He does not have any known ischemic disease and had a  nonischemic Myoview on April 26, 2006.   He has been compliant with his meds.   He has a very limited lifestyle.  He lives with his son.  He basically  gets out and about around the house and walks to his mailbox.   He has chronic lower extremity edema, which he takes Lasix for.   He is not having significant chest pain, palpitations, presyncope, and  his dyspnea is stable and chronic.   His current meds include Lasix 40 t.i.d., potassium 20 a day, Nasonex,  lisinopril 20 a day, and Agrylin 0.5 two tablets every other day  alternating with 3 tablets.   His exam is remarkable for an overweight black male.  He speaks  circuitously.  Weight is 291, blood pressure 140/80, pulse 80 and  regular, respiratory rate 14, afebrile.  HEENT unremarkable.  Carotids  are normal without bruit.  No lymphadenopathy, thyromegaly, or JVP  elevation.  Lungs are clear, good diaphragmatic motion.  No wheezing.  S1 and S2 with a mild AS murmur.  Second heart sound is well preserved.  No AI.  PMI normal.  Abdomen is protuberant.  Bowel sounds positive.  No  AAA, no tenderness, no bruit, no hepatosplenomegaly, no hepatojugular  reflux, no tenderness.  Distal pulses are intact.  Lower extremities  have +1 edema bilaterally.  Neuro nonfocal.  Skin warm  and dry.  No  muscular weakness.   IMPRESSION:  1. Dyspnea functional secondary to central obesity.  2. Mild aortic stenosis.  No need for followup echo at this time.      Exam is still benign.  Consider followup echo in 2 years.  3. Lower extremity edema.  Continue Lasix and potassium.  This is      dependent edema.  Continue to watch sodium in diet.  4. Hypertension, currently well controlled.  Continue current dose of      lisinopril.   Overall, I think Brekyn' heart is stable.  He will follow up with Dr.  Larose Kells.  We did give him a flu shot today.     Wallis Bamberg. Johnsie Cancel, MD, [facility]  Electronically Signed    PCN/MedQ  DD: 03/28/2008  DT: 03/28/2008  Job #: [id number]

## 2010-10-03 NOTE — Assessment & Plan Note (Signed)
[facility] OFFICE NOTE   NAME:BELLJarvis, Yochum                           MRN:          [id number]  DATE:04/12/2006                            DOB:          1940-10-17    Mr. Walters is seen today as a new patient.  He is 70 years old.  There is  a very confusing history.  He has been seen in [place] before.  Apparently there is some sort of history of congestive heart failure.  The patient is a poor historian.   As far as I can tell, he has never had a heart catheterization or recent  stress test.  He was hospitalized in March of 2006 here in [place]  for some non-cardiac issues.  At the time he had a 2D echocardiogram.  He had normal LV function with mitral annular calcification and mild  aortic stenosis.   The patient has some chronic exertional dyspnea.  It does not sound  significant.  There is no PND or orthopnea, there are no palpitations.  He also gets occasional chest pain, however a lot of times it is while  sitting.   His past medical history is remarkable for minor surgeries.  He says  that his records are at [facility].   He describes a problem with his heart as far back as 1992 when he had  some lower extremity swelling and stiffness all over.  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   His review of systems is remarkable for fatigue and weakness.  He feels  stiff, particularly in the legs.  He has occasional dizziness and of  course atypical chest pain and exertional dyspnea.   His medications include Cardizem, Agrylin, Atenolol, furosemide,  potassium, Tylenol, and Nasonex.  He does not know the dosages of any of  this.   Apparently there is a history of low platelet counts.   I have no lab work on him.   He reports having no allergies.   He is retired.  He does not do much around the house.  His son [name] who  is with him today lives with him.  Apparently  he has some emotional  problems and is disabled.  He does not smoke or drink.   In talking to the patient he sort of rambles a bit and his history is  very difficult to take.   PHYSICAL EXAMINATION:  Remarkable for blood pressure of 130/60, pulse is  47 and regular.  HEENT:  Normal.  He has a keloid over his chest.  There is no  lymphadenopathy, no carotid bruits.  LUNGS:  are clear.  HEART:  He is an S1, S2 with a mild AS murmur.  ABDOMEN:  Benign.  He is obese.  There is no hepatosplenomegaly, no  abdominal aortic aneurysm.  EXTREMITIES:  Distal pulses are intact.  No edema.  Neuro: non-focal  Skin: warm and dry   His EKG shows sinus brady with nonspecific ST-T-wave changes.   IMPRESSION:  Mr. Decandia symptoms are  very confusing.  I do not think he  has a significant cardiomyopathy.  He will have a followup stress  Myoview to rule out coronary disease in light of his chest pain and  history of cardiomegaly.  His echo a year ago showed normal LV function.  His aortic stenosis is not severe enough to cause any problems.  He does  have significant bradycardia with a heart rate of 47.  I told him to  stop his Atenolol entirely.  He will call us and let us know the dose he  was on but I suspect it was 25 mg.  If his stress test is normal, I will  see him back in about a month to reassess his heart rate.   There is currently no indication for a pacemaker.   Overall, I do not think that there is any critical cardiac problem going  on at this time.     Wallis Bamberg. Johnsie Cancel, MD, [facility]  Electronically Signed    PCN/MedQ  DD: 04/12/2006  DT: 04/12/2006  Job #: [id number]   cc:   [name], MD

## 2010-10-03 NOTE — H&P (Signed)
NAMELEONE, CHECCHI                  ACCOUNT NO.:  [id number]   MEDICAL RECORD NO.:  [id number]          PATIENT TYPE:  EMS   LOCATION:  ED                           FACILITY:  [facility]   PHYSICIAN:  Melissa L. Lovena Le, MD  DATE OF BIRTH:  08-Dec-1940   DATE OF ADMISSION:  08/04/2004  DATE OF DISCHARGE:                                HISTORY & PHYSICAL   CHIEF COMPLAINT:  Left foot tightness and swelling.   PRIMARY CARE PHYSICIAN:  Unassigned.  His physicians have been in [place].  He has not seen them in about three months.   HISTORY OF PRESENT ILLNESS:  This is a 70 year old African-American male who  last Thursday developed a sensation of tightness in his foot.  He could not  get his shoe on comfortably.  He took his shoe off and it appeared reddened  and so he placed some Bacitracin on that and continued to follow the course  of his symptoms.  He states that since that time the leg has continued to  get more red and swollen.  He took some Tylenol with that and then decided  to come in the emergency room today when he developed chills and a headache.   REVIEW OF SYSTEMS:  No fever, but positive dyspnea on exertion, positive  shortness of breath.  No nausea, vomiting, or diarrhea.  He does give the  history of increased platelets which sounds like a myeloproliferative  disorder.  He has no epistaxis.  No weight loss or gain.  All other review  of systems are negative.   PAST MEDICAL HISTORY:  Cardiomegaly, enlarged heart, congestive heart  failure, seasonal allergies, hypertension.  He denies any diabetes.  States  he has an elevated platelet count for which he takes Agrylin.   PAST SURGICAL HISTORY:  Minor surgery on the left foot.   ALLERGIES:  No known drug allergies.   MEDICATIONS:  1.  Potassium 20 mg daily.  2.  Lasix 40 mg three tablets throughout the course of the day.  He said he      alters it according to whether or not he has to go to the bathroom or      can go  to the bathroom.  3.  Atenolol 25 mg daily.  4.  Norvasc 5 mg daily.  5.  Aspirin 81 mg daily.  6.  Agrylin 0.5 mg four tablets q.i.d.   SOCIAL HISTORY:  He does not smoke.  He does not drink.  He does not do any  drugs.  He is retired.   FAMILY HISTORY:  Mom is deceased with unknown medical condition.  Dad is  deceased with unknown medical conditions.  He is married and only living  part time here in [place].   PHYSICAL EXAMINATION:  VITAL SIGNS:  Temperature 97.3, blood pressure  137/84, pulse 83, respirations 22, saturations 96%.  GENERAL:  He is in no acute distress.  He is well-developed, well-nourished,  mildly obese.  HEENT:  Normocephalic, atraumatic.  Pupils equal, round, reactive to light.  Extraocular muscles are  intact.  Mucous  membranes are moist.  NECK:  Supple.  There is trace jugular venous distention.  Carotid bruits.  He has no thyromegaly.  CHEST:  Decreased breath sounds bilaterally, but no rhonchi, rales, or  wheezes.  CARDIOVASCULAR:  Regular rate and rhythm.  Positive S1, S2.  No S3, S4.  No  murmurs, rubs or gallops are noted.  However, the heart sounds are distant.  ABDOMEN:  Obese, nontender, nondistended with positive bowel sounds.  EXTREMITIES:  The right leg is cool to touch.  There are signs of  hyperpigmentation consistent with chronic venous stasis and evidence for  recent edema.  His left leg is edematous, warm, and cellulitic.  There are  no active pussy lesions.  There is an area of pink granulation tissue that  he swears was not open, that he just brushed against something.  There is no  evidence at this time for tinia pedis on either feet.  NEUROLOGIC:  He is awake, alert, oriented x3.  Cranial nerves II-XII are  intact.  Power is 5/5.  Deep tendon reflexes are 2+.  His sensation appears  to be grossly intact.   LABORATORY DATA:  White count 7.6, hemoglobin 10.5, hematocrit 30.8,  platelets 3362, sodium 138, potassium 4.0, Co2 27, BUN  17, creatinine 1.2,  glucose is 111.  His BNP is less than 30 and his D-dimer is within normal  limits.  EKG shows normal sinus rhythm with no acute ST-T wave changes.   ASSESSMENT/PLAN:  This is a 70 year old African-American male presenting  with left lower extremity swelling and redness, but also gives a history of  PND orthopnea even though his physical exam appears compensated.  He states  he had to put himself up in the bed with extra pillows.  1.  Cardiovascular.  Congestive heart failure by history.  He appears      compensated at this time, but does give a history for PND and orthopnea.      I will therefore increase his Lasix slightly, check his daily weights,      check cardiac enzymes, check 2-D echo, and admit him to the telemetry      floor.  2.  Pulmonary.  He has mild dyspnea on exertion, likely cardiac related.      Will try to diuresis him a little bit.  3.  GI.  There are no current issues or complaints.  I will put him on a      heart-healthy diet.  4.  GU.  Will check a UA, C&S.  5.  Endocrine.  He denies any diabetes, but we will check a hemoglobin A1c.  6.  ID.  I will start him on Zosyn, check blood cultures x2 sets, Dopplers      of lower extremity.      MLT/MEDQ  D:  08/04/2004  T:  08/04/2004  Job:  [id number]

## 2010-10-03 NOTE — Op Note (Signed)
NAME:  Juan [name], Juan [name]                      ACCOUNT NO.:  0   MEDICAL RECORD NO.:  [id number]          PATIENT TYPE:  INP   LOCATION:  [id number]                         [facility]ACILITY[facility]   PHYSICIAN:  Darrelyn Hillock, MDDATE OF BIRTH:  1940/11/28   DATE OF PROCEDURE:  08/07/2004  DATE OF DISCHARGE:                                 OPERATIVE REPORT   PREOPERATIVE DIAGNOSIS:  Left thigh mass.   POSTOPERATIVE DIAGNOSIS:  Hematoma.   OPERATION/PROCEDURE:  Incision and drainage of left calf hematoma.   DESCRIPTION OF PROCEDURE:  At the bedside, the skin overlying the mass in  the left lateral calf is anesthetized.  Incision is made and entering a  hematoma cavity, the hematoma is drained and dressing is placed.  The  patient tolerated the procedure well.      KRH/MEDQ  D:  08/07/2004  T:  08/07/2004  Job:  [id number]

## 2010-10-03 NOTE — Assessment & Plan Note (Signed)
[facility] OFFICE NOTE   NAME:BELLDerrelle, Burditt                           MRN:          [id number]  DATE:05/06/2006                            DOB:          [birth year]    Mr. Mahan returns for followup.   I initially saw him on April 12, 2006 with a confusing history of  cardiomegaly and previous heart problems.  However, in talking to him, a  lot of his previous workup was done in [place].  He continues to have  complaints of difficulty sleeping and fatigue as well as exertional  dyspnea.  However, his 2D echocardiogram is entirely normal with normal  LV function and only trivial aortic stenosis.  This can be followed on a  yearly basis.  His Myoview was normal with an EF of 55-65% and no  evidence of ischemia.  I told Mr. Ramamurthy that I thought his heart was  structurally sound.   I think a lot of his exertional dyspnea is due to his significant  central obesity.   We talked a little bit about his sleep habits in terms of not napping  during the day.  I told him he could take a little Benadryl at night to  help him with this.   He does not give a history that is compatible with sleep apnea, but this  may need to be looked at in the future.   The patient appears to have some relative bradycardia.  We had stopped  his Atenolol and his heart rate is now 52.  I told him he really should  not be on any beta blockers in the future.  His primary MD also has him  on a particular dose of Cardizem which is unknown.  We will try to track  this down but this seems to be controlling his blood pressure and for  the time being we will continue it.   EXAMINATION:  He is overweight, blood pressure is 128/70, pulse is 52-54  and regular.  HEENT:  Normal, there is no thyromegaly, no lymphadenopathy.  LUNGS:  Clear.  He has an S1, S2 with a mild systolic ejection murmur.  ABDOMEN:  Benign.  EXTREMITIES:  Intact pulse, no  edema.   IMPRESSION:  Trivial aortic stenosis, followup echo probably in 2 years.  No evidence of ischemic heart disease or decreased left ventricular  function.  Functional exertional dyspnea due to being overweight.   Continue low-dose Cardizem to define dose when patient calls back for  hypertension relative chronotropic incompetence.  Beta blocker stopped  and should not be restarted in the future.   Will see the patient back in a year.     Wallis Bamberg. Johnsie Cancel, MD, [facility]  Electronically Signed    PCN/MedQ  DD: 05/06/2006  DT: 05/06/2006  Job #: [id number]   cc:   [name], [place]

## 2010-10-03 NOTE — Discharge Summary (Signed)
Juan [name], Juan [name]                  ACCOUNT NO.:  [id number]   MEDICAL RECORD NO.:  [id number]          PATIENT TYPE:  INP   LOCATION:  [id number]                         [facility]ACILITY:  [facility]   PHYSICIAN:  Ashby Dawes. Polite, M.D. DATE OF BIRTH:  01-Feb-1941   DATE OF ADMISSION:  08/04/2004  DATE OF DISCHARGE:                                 DISCHARGE SUMMARY   DISCHARGE DIAGNOSES:  1.  Left leg cellulitis, improved.  2.  Left leg hematoma status post incision and drainage by surgery which      revealed Juan clots.  3.  One out two positive blood culture positive for coagulase negative      staphylococcus consistent with contaminant. Please note:  Treated with      vancomycin x3 doses empirically.  4.  Anemia, normocytic. Please note:  The patient has had a gastroenterology      evaluation October 2005 while in [place]. No heme positive stools      during this admission.  5.  Obstructive sleep apnea, noncompliant with CPAP.  6.  Essential thrombocytosis; continue current outpatient medications.  7.  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  8.  Deconditioning.  9.  Obesity.  10. Seasonal allergies or sinus congestion and rhinitis.  11. Chronic leg edema.   DISCHARGE MEDICATIONS:  1.  Keflex 500 mg q.6h. x7 days.  2.  Lasix 80 mg in the a.m., 40 mg in the p.m.  3.  Agrylin home dose.  4.  Ferrous sulfate 325 mg b.i.d.  5.  Claritin 10 mg daily.  6.  Flonase two sprays per nostril daily.  7.  The patient is asked not to take Norvasc as it may be contributing to      his leg swelling.  8.  Atenolol 50 mg daily.  9.  No aspirin x1 week.  10. K-Dur 20 mEq daily.   DISPOSITION:  The patient is being discharged to home in stable condition.  Asked to follow up with surgeon, Dr. Truitt Leep, in approximately 2 week for  follow-up evaluation of the wound. Will arrange home health PT and home  health R.N. for wound care. The patient is asked to check weights daily and  keep a  log. The patient is also asked to get lab work BMET in 1 week.   CONSULTATIONS:  Dr. Truitt Leep, surgery.   DATA:  Blood culture:  One out of two coag negative staph. Urine culture:  Negative. Chest x-ray:  Right greater than left basilar atelectasis. CBC at  discharge:  White count 8.9, hemoglobin 9.3, platelets 295. CBC on  admission:  White count 10.6, hemoglobin 10.5, MCV 85, platelets 362.  Hemoglobin A1c 6.3. Lipid panel:  Total cholesterol 124, LDL 75, HDL 35.  Iron level of 31, percent saturation 12, ferritin 322. UA:  Negative. The  patient had ABIs which were within normal limits on the right, unable to  ascertain on the left secondary to the patient unable to tolerate pressure  from probe; however, waveforms were triphasic. Lower extremity Doppler  ultrasound negative for DVT. Juan records  reviewed from [name]  showed the patient had a colonoscopy on October 2005 that showed moderate  diverticulosis; however, is incomplete secondary to loop. The patient did  have a barium contrast enema which showed no abnormalities. The patient had  an echocardiogram in 2003 with EF of 62%.   HISTORY OF PRESENT ILLNESS:  A 70 year Juan male presented to the hospital  for evaluation of tingling in his left foot and was diagnosed with  cellulitis. Admission was deemed necessary for further evaluation and  treatment. Please note:  Juan [name] shows the  patient has had multiple evaluations for this tingling in his foot and  cellulitis in that leg. The patient has had MRIs without any signs of  impingement. Please see dictated H&P for further details.   PAST MEDICAL HISTORY:  As stated above.   HOSPITAL COURSE:  #1 - The patient was admitted to a medicine floor bed for  further evaluation and treatment of lower extremity cellulitis. The patient  was started on empiric antibiotics - IV Zosyn. The patient had other studies  as indicated - ABI, ultrasound, chest  x-ray - with no acute abnormalities.  The patient remained afebrile throughout this hospitalization without any  significant leukocytosis. On March 21 it was noted that the patient did have  a fluctuant area on the lateral aspect of his left leg. There was no warmth  or erythema. However, the patient did have a positive blood culture. Because  of that, the patient had vancomycin added to his medication regimen and  surgery was consulted. An attempt at CT was ordered; however, it was not  completed as ordered. Therefore, surgery simply did I&D of the fluctuant  area and it was found to have hematoma in that area; no signs of infection  per Dr. Truitt Leep. The patient was continued on antibiotics. As stated, his  blood cultures were 1/2 for coag negative staph which was felt to be  consistent with contaminant. The patient was seen by PT who recommended home  PT. At this time, the patient is medically stable for home. The patient will  continue oral antibiotics x1 week. Asked him to follow-up outpatient with  Dr. Truitt Leep for wound care evaluation.   #2 - POSITIVE BLOOD CULTURE. Coag negative staph 1/2 consistent with  contaminant. The patient was treated empirically with vancomycin x3 doses  until final culture was obtained. I do not feel any additional antibiotics  are indicated.   #3 - LEFT LEG HEMATOMA. As stated in #1, the patient was seen by surgery,  status post I&D of that area consistent with hematoma. The patient most  likely had some unrecognized trauma to that area. The patient will require  wound care and antibiotics as indicated.   #4 - OBSTRUCTIVE SLEEP APNEA. The patient has been noncompliant with CPAP  during this hospitalization secondary to sinus congestion. Recommend the  patient continue CPAP at home.   #5 - CONGESTIVE HEART FAILURE SECONDARY TO DIASTOLIC DYSFUNCTION. The  patient's use of his diuretic may have been somewhat inconsistent. Recommended the patient  continue his Lasix as outlined - 80 in the a.m., 40  in the p.m. The patient will need follow-up BMET on an outpatient basis and  will need to assume care with a primary M.D. in the community.   #6 - ANEMIA. The patient was found to be anemic during this hospitalization.  Hemoglobin 10.5. The patient had iron studies consistent with iron  deficiency. Please note, the patient  has had a GI evaluation in [place]  in October 2005. The patient had colonoscopy which showed significant  diverticulosis and also had a double contrast barium enema that had no  abnormalities. The patient most likely is having some occasional bleeding  from those  diverticular areas. However, stools have been heme negative throughout this  hospitalization. The patient is recommended to continue iron. Again, follow  up with primary M.D. in the community ASAP.   At this time, the patient is medically stable for discharge.      RDP/MEDQ  D:  08/08/2004  T:  08/08/2004  Job:  [id number]

## 2010-12-13 ENCOUNTER — Other Ambulatory Visit: Payer: Self-pay | Admitting: Internal Medicine

## 2011-01-08 ENCOUNTER — Ambulatory Visit (INDEPENDENT_AMBULATORY_CARE_PROVIDER_SITE_OTHER): Payer: Medicare Other | Admitting: Internal Medicine

## 2011-01-08 ENCOUNTER — Encounter: Payer: Self-pay | Admitting: Internal Medicine

## 2011-01-08 DIAGNOSIS — D649 Anemia, unspecified: Secondary | ICD-10-CM

## 2011-01-08 DIAGNOSIS — E119 Type 2 diabetes mellitus without complications: Secondary | ICD-10-CM

## 2011-01-08 DIAGNOSIS — E538 Deficiency of other specified B group vitamins: Secondary | ICD-10-CM

## 2011-01-08 DIAGNOSIS — I10 Essential (primary) hypertension: Secondary | ICD-10-CM

## 2011-01-08 DIAGNOSIS — I509 Heart failure, unspecified: Secondary | ICD-10-CM

## 2011-01-08 LAB — BASIC METABOLIC PANEL
Chloride: 105 mEq/L (ref 96–112)
GFR: 61.41 mL/min (ref >60.00)
Potassium: 4.5 mEq/L (ref 3.5–5.1)

## 2011-01-08 LAB — CBC WITH DIFFERENTIAL/PLATELET
Basophils Relative: 1.8 % (ref 0.0–3.0)
Eosinophils Absolute: 0.1 10*3/uL (ref 0.0–0.7)
MCHC: 32.5 g/dL (ref 30.0–36.0)
MCV: 86.6 fl (ref 78.0–100.0)
Monocytes Absolute: 0.8 10*3/uL (ref 0.1–1.0)
Neutrophils Relative %: 58.9 % (ref 43.0–77.0)
RBC: 4.14 Mil/uL — ABNORMAL LOW (ref 4.22–5.81)
RDW: 18.8 % — ABNORMAL HIGH (ref 11.5–14.6)

## 2011-01-08 LAB — IRON: Iron: 47 ug/dL (ref 42–165)

## 2011-01-08 NOTE — Assessment & Plan Note (Signed)
No evidence of volume overload at this point

## 2011-01-08 NOTE — Progress Notes (Signed)
  Subjective:    Patient ID: Juan [name], male    DOB: [birth year], 70 y.o.   MRN: [id number]  HPI Juan [name] to the ER 09/2010, chief complaint was lower extremity edema "stinging and hurting", on exam the physician at the ER found his right leg to be more swollen. Ultrasound was negative for DVT on either side D-dimer negative, BMP, LFTs normal. CBC show a hemoglobin of 9.9 which is lower than usual. Chart is reviewed  In reference to anemia, see below. They increased lasix 40 mg from 3 a day to 4 a day, he takes 4 tabs at night most nights, sometimes just 3 tabs   Past Medical History  Diagnosis Date  . CAD (coronary artery disease)     dx elsewheer in past, no documentation. Non-ischemic myovue 2007  . CHF (congestive heart failure)     dx elsewhere, no documentation. Normal EF by previous echo. Trival AS needs f/u ECHO by 2012  . Diabetes mellitus   . Hypertension   . Sleep apnea, obstructive     at some point used CPAP, was d/c  years ago  . Anemia   . History of thrombocytosis   . Allergic rhinitis   . Edema     R>L leg, u/s 5-12 neg for DVT   No past surgical history on file.    Review of Systems Edema ~ the same but over all feels better, less " stingy" No N-V-D, no blood in stools or change in color of stools No CP, SOB chronic and at baseline     Objective:   Physical Exam  Constitutional: He appears well-developed and well-nourished. No distress.  Cardiovascular: Normal rate, regular rhythm and normal heart sounds.   No murmur heard. Pulmonary/Chest: Effort normal and breath sounds normal. No respiratory distress. He has no wheezes. He has no rales.  Abdominal: Soft. Bowel sounds are normal. He exhibits no distension. There is no tenderness. There is no rebound.  Musculoskeletal:       Trace pitting edema bilaterally, R leg slt bigger than L  Neurological: He is alert.  Psychiatric: He has a normal mood and affect. His behavior is normal. Judgment and thought content  normal.          Assessment & Plan:

## 2011-01-08 NOTE — Assessment & Plan Note (Addendum)
chart reviewed Cscope (incomplete d/t looping) in 20-24- 2005 and f/u by  BE: Dx w/  TICS  (done elsewhere) Hg stable until 5-12, iron has been normal before, hemocult 01-2010 neg Plan: Labs to r/o iron def and B12 def

## 2011-01-08 NOTE — Assessment & Plan Note (Signed)
Due for labs

## 2011-01-08 NOTE — Assessment & Plan Note (Signed)
Well controlled, on lasix (reports takes lasix 40 mg  3 or 4 tabs a day)

## 2011-01-16 ENCOUNTER — Other Ambulatory Visit: Payer: Self-pay | Admitting: Internal Medicine

## 2011-01-17 ENCOUNTER — Other Ambulatory Visit: Payer: Self-pay | Admitting: Internal Medicine
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
## 2011-01-20 NOTE — Telephone Encounter (Signed)
Rx Done . 

## 2011-01-21 NOTE — Telephone Encounter (Signed)
Patient called to check status of both prescriptions because he will be out tomorrow

## 2011-01-22 ENCOUNTER — Other Ambulatory Visit: Payer: Self-pay | Admitting: Internal Medicine

## 2011-01-22 NOTE — Telephone Encounter (Signed)
Pt's prescription for potassium had not been received so potassium called in to pharmacy 30x 5rf.

## 2011-02-15 ENCOUNTER — Other Ambulatory Visit: Payer: Self-pay | Admitting: Internal Medicine

## 2011-02-16 NOTE — Telephone Encounter (Signed)
Rx Done . 

## 2011-02-16 NOTE — Telephone Encounter (Signed)
Spoke with Apolonio Schneiders at [facility] and gave her the approval documented from 01/22/11.

## 2011-02-23 ENCOUNTER — Emergency Department (HOSPITAL_COMMUNITY)
Admission: EM | Admit: 2011-02-23 | Discharge: 2011-02-23 | Disposition: A | Discharge status: Home / Self Care | Payer: Medicare Other | Attending: Emergency Medicine | Admitting: Emergency Medicine

## 2011-02-23 DIAGNOSIS — R6884 Jaw pain: Secondary | ICD-10-CM | POA: Insufficient documentation

## 2011-02-23 DIAGNOSIS — I2510 Atherosclerotic heart disease of native coronary artery without angina pectoris: Secondary | ICD-10-CM | POA: Insufficient documentation

## 2011-02-23 DIAGNOSIS — T1490XA Injury, unspecified, initial encounter: Secondary | ICD-10-CM | POA: Insufficient documentation

## 2011-02-25 ENCOUNTER — Ambulatory Visit (INDEPENDENT_AMBULATORY_CARE_PROVIDER_SITE_OTHER): Payer: Medicare Other

## 2011-02-25 DIAGNOSIS — Z23 Encounter for immunization: Secondary | ICD-10-CM

## 2011-03-19 ENCOUNTER — Other Ambulatory Visit: Payer: Self-pay | Admitting: *Deleted

## 2011-03-19 MED ORDER — LISINOPRIL 20 MG PO TABS: 20.0000 mg | ORAL_TABLET | Freq: Every day | ORAL | Status: DC

## 2011-05-07 ENCOUNTER — Ambulatory Visit (INDEPENDENT_AMBULATORY_CARE_PROVIDER_SITE_OTHER): Payer: Medicare Other | Admitting: Internal Medicine

## 2011-05-07 ENCOUNTER — Encounter: Payer: Self-pay | Admitting: Internal Medicine

## 2011-05-07 VITALS — BP 138/82 | HR 64 | Temp 97.9°F | Ht 73.0 in | Wt 268.0 lb

## 2011-05-07 DIAGNOSIS — I509 Heart failure, unspecified: Secondary | ICD-10-CM

## 2011-05-07 DIAGNOSIS — E119 Type 2 diabetes mellitus without complications: Secondary | ICD-10-CM

## 2011-05-07 DIAGNOSIS — I10 Essential (primary) hypertension: Secondary | ICD-10-CM

## 2011-05-07 NOTE — Assessment & Plan Note (Addendum)
Well control, will check a cholesterol panel

## 2011-05-07 NOTE — Assessment & Plan Note (Signed)
No change, exam normal
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
## 2011-05-07 NOTE — Assessment & Plan Note (Signed)
Well-controlled per last A1c

## 2011-05-07 NOTE — Patient Instructions (Addendum)
Use hydrocortisone 1% OTC twice a day x 1 week at the itchy area Came back fasting ----> FLP (dx hyperlipidemia) Continue same medicines

## 2011-05-13 ENCOUNTER — Other Ambulatory Visit: Payer: Self-pay | Admitting: Internal Medicine

## 2011-05-13 DIAGNOSIS — E785 Hyperlipidemia, unspecified: Secondary | ICD-10-CM

## 2011-05-14 ENCOUNTER — Other Ambulatory Visit (INDEPENDENT_AMBULATORY_CARE_PROVIDER_SITE_OTHER): Payer: Medicare Other

## 2011-05-14 DIAGNOSIS — E785 Hyperlipidemia, unspecified: Secondary | ICD-10-CM

## 2011-05-14 LAB — LIPID PANEL
Cholesterol: 130 mg/dL (ref 0–200)
LDL Cholesterol: 52 mg/dL (ref 0–99)
Triglycerides: 40 mg/dL (ref 0.0–149.0)
VLDL: 8 mg/dL (ref 0.0–40.0)

## 2011-05-14 NOTE — Progress Notes (Signed)
LABS ONLY  

## 2011-05-18 ENCOUNTER — Encounter: Payer: Self-pay | Admitting: Internal Medicine

## 2011-06-09 ENCOUNTER — Encounter: Payer: Self-pay | Admitting: Cardiovascular Disease

## 2011-06-09 ENCOUNTER — Ambulatory Visit (INDEPENDENT_AMBULATORY_CARE_PROVIDER_SITE_OTHER): Payer: Medicare Other | Admitting: Cardiovascular Disease

## 2011-06-09 DIAGNOSIS — I10 Essential (primary) hypertension: Secondary | ICD-10-CM

## 2011-06-09 DIAGNOSIS — I2510 Atherosclerotic heart disease of native coronary artery without angina pectoris: Secondary | ICD-10-CM | POA: Diagnosis not present

## 2011-06-09 DIAGNOSIS — R609 Edema, unspecified: Secondary | ICD-10-CM | POA: Diagnosis not present

## 2011-06-09 NOTE — Assessment & Plan Note (Signed)
Continue lasix.  Support hose Venous insuficiency.  Low sodium diet

## 2011-06-09 NOTE — Patient Instructions (Signed)
Your physician wants you to follow-up in:  12 months.  You will receive a reminder letter in the mail two months in advance. If you don't receive a letter, please call our office to schedule the follow-up appointment.   

## 2011-06-09 NOTE — Progress Notes (Signed)
Juan [name] is seen today for F/U of HTN, and edema. He carries a Dx of CAD and CHF. There has never been any documentation of this. He indicated these problems when he moved here from [place]> He had a normal myovue and echo in 2007. He is a poor historian but denies PND, orthopnea and his edema is improved. He has mild chronic fatigue and exertional dyspnea. He denies cough or sputum. He has been compliant with his meds. His son lives with him and has some psycholigical issues. This is a little stressful for him  Echo 1/11 showed normal EF 55-60%  ECG reviewed today NSR 70 Normal  ROS: Denies fever, malais, weight loss, blurry vision, decreased visual acuity, cough, sputum, SOB, hemoptysis, pleuritic pain, palpitaitons, heartburn, abdominal pain, melena, lower extremity edema, claudication, or rash.  All other systems reviewed and negative  General: Affect appropriate Healthy:  appears stated age 71: normal Neck supple with no adenopathy JVP normal no bruits no thyromegaly Lungs clear with no wheezing and good diaphragmatic motion Heart:  S1/S2 no murmur,rub, gallop or click PMI normal Abdomen: benighn, BS positve, no tenderness, no AAA no bruit.  No HSM or HJR Distal pulses intact with no bruits No edema Neuro non-focal Skin warm and dry No muscular weakness   Current Outpatient Prescriptions  Medication Sig Dispense Refill  . acetaminophen (TYLENOL) 325 MG tablet 1-4 by mouth once daily as needed for headache.       . anagrelide (AGRYLIN) 0.5 MG capsule Take 0.5 mg by mouth daily.       . fluticasone (FLONASE) 50 MCG/ACT nasal spray 2 sprays by Nasal route daily.        . furosemide (LASIX) 40 MG tablet TAKE 3 TABLETS BY MOUTH ONCE DAILY  90 tablet  0  . lisinopril (PRINIVIL,ZESTRIL) 20 MG tablet Take 1 tablet (20 mg total) by mouth daily.  30 tablet  1  . potassium chloride SA (K-DUR,KLOR-CON) 20 MEQ tablet TAKE 1 TABLET BY MOUTH DAILY  30 tablet  5    Allergies  Review of patient's  allergies indicates no known allergies.  Electrocardiogram:  NSR LVH T wave changes inferolateral leads  Assessment and Plan

## 2011-06-09 NOTE — Assessment & Plan Note (Signed)
Well controlled.  Continue current medications and low sodium Dash type diet.    

## 2011-06-09 NOTE — Assessment & Plan Note (Signed)
Historical no documentation No angina ASA

## 2011-06-22 ENCOUNTER — Other Ambulatory Visit: Payer: Self-pay | Admitting: Internal Medicine

## 2011-06-22 ENCOUNTER — Other Ambulatory Visit: Payer: Self-pay | Admitting: *Deleted

## 2011-06-22 MED ORDER — ANAGRELIDE HCL 0.5 MG PO CAPS: 0.5000 mg | ORAL_CAPSULE | Freq: Every day | ORAL | Status: DC

## 2011-06-22 NOTE — Telephone Encounter (Signed)
Rx sent 

## 2011-07-14 ENCOUNTER — Other Ambulatory Visit: Payer: Self-pay | Admitting: Internal Medicine

## 2011-07-14 NOTE — Telephone Encounter (Signed)
Refill done.  

## 2011-08-12 ENCOUNTER — Other Ambulatory Visit: Payer: Self-pay | Admitting: Internal Medicine

## 2011-08-12 NOTE — Telephone Encounter (Signed)
Refill done.  

## 2011-09-10 ENCOUNTER — Ambulatory Visit (INDEPENDENT_AMBULATORY_CARE_PROVIDER_SITE_OTHER): Payer: Medicare Other | Admitting: Internal Medicine

## 2011-09-10 VITALS — BP 142/80 | HR 70 | Temp 97.5°F | Wt 255.0 lb

## 2011-09-10 DIAGNOSIS — E119 Type 2 diabetes mellitus without complications: Secondary | ICD-10-CM

## 2011-09-10 DIAGNOSIS — G4733 Obstructive sleep apnea (adult) (pediatric): Secondary | ICD-10-CM

## 2011-09-10 DIAGNOSIS — D759 Disease of blood and blood-forming organs, unspecified: Secondary | ICD-10-CM | POA: Diagnosis not present

## 2011-09-10 DIAGNOSIS — I10 Essential (primary) hypertension: Secondary | ICD-10-CM | POA: Diagnosis not present

## 2011-09-10 LAB — BASIC METABOLIC PANEL
CO2: 26 mEq/L (ref 19–32)
Chloride: 106 mEq/L (ref 96–112)
Creatinine, Ser: 1 mg/dL (ref 0.4–1.5)
Potassium: 4.3 mEq/L (ref 3.5–5.1)

## 2011-09-10 MED ORDER — ANAGRELIDE HCL 0.5 MG PO CAPS: 0.5000 mg | ORAL_CAPSULE | Freq: Two times a day (BID) | ORAL | Status: DC

## 2011-09-10 NOTE — Progress Notes (Signed)
  Subjective:    Patient ID: Juan [name], male    DOB: [birth year], 71 y.o.   MRN: [id number]  HPI ROV Reports he is doing okay, "I'm taking it easy" Changing pharmacies from [facility] to [facility], liked me to be aware  Past Medical History  Diagnosis Date  . CAD (coronary artery disease)     dx elsewheer in past, no documentation. Non-ischemic myovue 2007  . CHF (congestive heart failure)     dx elsewhere, no documentation. Normal EF by previous echo. Trival AS needs f/u ECHO by 2012  . Diabetes mellitus   . Hypertension   . Sleep apnea, obstructive     at some point used CPAP, was d/c  years ago  . Anemia   . History of thrombocytosis   . Allergic rhinitis   . Edema     R>L leg, u/s 5-12 neg for DVT    Review of Systems Has a diagnosis of CAD, sawcardiology 05/2011 stable. Reports good medication compliance with all meds except Agrylin, see assessment and plan. Ambulatory blood pressures within normal per patient Denies any chest pain, shortness of breath at baseline. Occasionally has lower extremity edema. Respond well to Lasix.     Objective:   Physical Exam General -- alert, well-developed, and overweight appearing. No apparent distress.  lungs -- normal respiratory effort, no intercostal retractions, no accessory muscle use, and normal breath sounds.   Heart-- normal rate, regular rhythm, no murmur, and no gallop.   Extremities-- no pretibial edema on today's exam Neurologic-- alert & oriented X3 and strength normal in all extremities.     Assessment & Plan:

## 2011-09-10 NOTE — Assessment & Plan Note (Signed)
He has a history of sleep apnea diagnosed elsewhere in the past, he is CPAP intolerance.

## 2011-09-10 NOTE — Assessment & Plan Note (Signed)
Diet control only, labs

## 2011-09-10 NOTE — Assessment & Plan Note (Signed)
BP 142/80, reports normal ambulatory BPs. No change, check a BMP

## 2011-09-10 NOTE — Assessment & Plan Note (Addendum)
Carries  the diagnosis of essential thrombocytosis, on Agrylin. Cost of agrylin is an issue, he was supposed to take 2 tablets daily alternating with 3 tablets daily. Has been taking one tablet qd. Plan: Change prescription to one tablet twice a day.

## 2011-09-11 ENCOUNTER — Encounter: Payer: Self-pay | Admitting: Internal Medicine

## 2011-09-15 ENCOUNTER — Encounter: Payer: Self-pay | Admitting: *Deleted

## 2011-11-16 ENCOUNTER — Other Ambulatory Visit: Payer: Self-pay | Admitting: Internal Medicine

## 2011-11-17 NOTE — Telephone Encounter (Signed)
Refill done.  

## 2011-12-24 LAB — HM DIABETES EYE EXAM: HM Diabetic Eye Exam: NORMAL

## 2012-01-14 ENCOUNTER — Encounter: Payer: Self-pay | Admitting: Internal Medicine

## 2012-01-14 ENCOUNTER — Ambulatory Visit (INDEPENDENT_AMBULATORY_CARE_PROVIDER_SITE_OTHER): Payer: Medicare Other | Admitting: Internal Medicine

## 2012-01-14 VITALS — BP 116/72 | HR 98 | Temp 97.7°F | Ht 72.75 in | Wt 221.0 lb

## 2012-01-14 DIAGNOSIS — D649 Anemia, unspecified: Secondary | ICD-10-CM | POA: Diagnosis not present

## 2012-01-14 DIAGNOSIS — E119 Type 2 diabetes mellitus without complications: Secondary | ICD-10-CM | POA: Diagnosis not present

## 2012-01-14 DIAGNOSIS — R634 Abnormal weight loss: Secondary | ICD-10-CM | POA: Diagnosis not present

## 2012-01-14 DIAGNOSIS — I10 Essential (primary) hypertension: Secondary | ICD-10-CM

## 2012-01-14 DIAGNOSIS — N400 Enlarged prostate without lower urinary tract symptoms: Secondary | ICD-10-CM | POA: Diagnosis not present

## 2012-01-14 DIAGNOSIS — N139 Obstructive and reflux uropathy, unspecified: Secondary | ICD-10-CM | POA: Diagnosis not present

## 2012-01-14 DIAGNOSIS — N401 Enlarged prostate with lower urinary tract symptoms: Secondary | ICD-10-CM

## 2012-01-14 DIAGNOSIS — N138 Other obstructive and reflux uropathy: Secondary | ICD-10-CM

## 2012-01-14 DIAGNOSIS — Z23 Encounter for immunization: Secondary | ICD-10-CM | POA: Diagnosis not present

## 2012-01-14 DIAGNOSIS — Z0000 Encounter for general adult medical examination without abnormal findings: Secondary | ICD-10-CM | POA: Diagnosis not present

## 2012-01-14 LAB — CBC WITH DIFFERENTIAL/PLATELET
Basophils Absolute: 0.3 10*3/uL — ABNORMAL HIGH (ref 0.0–0.1)
Eosinophils Relative: 2.5 % (ref 0.0–5.0)
Lymphocytes Relative: 26.6 % (ref 12.0–46.0)
Monocytes Relative: 8.4 % (ref 3.0–12.0)
Neutrophils Relative %: 59.6 % (ref 43.0–77.0)
Platelets: 162 10*3/uL (ref 150.0–400.0)
RDW: 19.2 % — ABNORMAL HIGH (ref 11.5–14.6)
WBC: 10.8 10*3/uL — ABNORMAL HIGH (ref 4.5–10.5)

## 2012-01-14 LAB — LIPID PANEL
HDL: 48.1 mg/dL (ref >39.00)
LDL Cholesterol: 90 mg/dL (ref 0–99)
Total CHOL/HDL Ratio: 3
Triglycerides: 113 mg/dL (ref 0.0–149.0)
VLDL: 22.6 mg/dL (ref 0.0–40.0)

## 2012-01-14 LAB — BASIC METABOLIC PANEL
CO2: 25 mEq/L (ref 19–32)
Calcium: 10.4 mg/dL (ref 8.4–10.5)
Chloride: 104 mEq/L (ref 96–112)
Glucose, Bld: 82 mg/dL (ref 70–99)
Potassium: 4.4 mEq/L (ref 3.5–5.1)
Sodium: 141 mEq/L (ref 135–145)

## 2012-01-14 MED ORDER — ZOSTER VACCINE LIVE 19400 UNT/0.65ML ~~LOC~~ SOLR: 0.6500 mL | Freq: Once | SUBCUTANEOUS | Status: AC

## 2012-01-14 NOTE — Assessment & Plan Note (Signed)
On no medicines, check a  A1c

## 2012-01-14 NOTE — Assessment & Plan Note (Signed)
Well controlled 

## 2012-01-14 NOTE — Assessment & Plan Note (Signed)
Wt losssnoted, not on porpouse, etiology unclear, has some financial issues and sometimes can not get the food he likes and ends up taking a "TV dinner". Will check a TSH, A1C we are also checking for anemia Recheck in 4 months

## 2012-01-14 NOTE — Assessment & Plan Note (Signed)
Mild BPH symptoms, see review of systems. DRE normal. Plan: PSA and observation

## 2012-01-14 NOTE — Patient Instructions (Addendum)
Please call by the end of October for a flu shot I recommend a shot call Zostavax Next office visit in 4 months to monitor  your weight

## 2012-01-14 NOTE — Progress Notes (Signed)
  Subjective:    Patient ID: Juan [name], male    DOB: Nov 28, 1940, 71 y.o.   MRN: [id number]  HPI Here for Medicare AWV: 1. Risk factors based on Past M, S, F history: reviewed 2. Physical Activities: sedentary 3. Depression/mood: No problemss noted or reported  4. Hearing:  No problemss noted or reported  5. ADL's: independent , states he just "take it easy" most days 6. Fall Risk: no recent events, prevention discussed  7. home Safety: does feel safe at home  8. Height, weight, &visual acuity: see VS, very mild blurred vision, last eye doctor visit ~ 4 years ago 17. Counseling: provided 10. Labs ordered based on risk factors: if needed  11. Referral Coordination: if needed 12.  Care Plan, see assessment and plan  13.   Cognitive Assessment: at baseline  In addition, today we discussed the following: Diabetes, not on  medication, no recent CBGs. Hypertension, takes Lasix, no recent ambulatory BPs. Weight loss noted, denies depression or anxiety. See assessment and plan.  Past Medical History: Diabetes Hypertension History of sleep apnea, at some points use a CPAP. h/o CAD ,dx elsewhere in the past,  no record of this.  Non-ischemic myovue 2007 H/o CHF , dx elesewhere  Normal EF by previous echo, last ECHO1-2011 normal EF, grade 1 diastolic dysfunction H/o  anemia and thrombocytosis Allergic rhinitis Chronic R>L LE edema ultrasound 09-2010 negative for DVT  Past Surgical History: no major surgeries   Social History: moved from [place] 2006, lives w/ son, currently not driving  widow [phone] retired diet-- trying to eat healthy  exercise-- very limited   tobacco-- quit 1968 [place]-- no  Family History: colon ca--no prostate ca--no MI--no DM--no  Review of Systems Denies chest pain or difficulty breathing Last week, for 2 days, had a self limited episode of  nausea, vomiting and diarrhea as well as some abdominal pain. No blood in the stools. Denies dysuria gross hematuria but  occasionally has difficulty urinating and some urinary frequency.    Objective:   Physical Exam General -- alert, well-developed.   Neck --no thyromegaly , normal carotid pulse Lungs -- normal respiratory effort, no intercostal retractions, no accessory muscle use, and normal breath sounds.   Heart-- normal rate, regular rhythm, no murmur, and no gallop.   Abdomen--soft, non-tender, no distention, no masses, no HSM, no guarding, and no rigidity.   Rectal-- No external abnormalities noted. Normal sphincter tone. No rectal masses or tenderness. Brown stool, Hemoccult negative Prostate:  Prostate gland firm and smooth, no enlargement, nodularity, tenderness, mass, asymmetry or induration. Neurologic-- alert & oriented X3 and strength normal in all extremities. Psych-- Cognition and judgment appear intact. Alert and cooperative with normal attention span and concentration.  not anxious appearing and not depressed appearing.      Assessment & Plan:

## 2012-01-14 NOTE — Assessment & Plan Note (Addendum)
See  previous entry Plan-- CBC , we are referring him for a screening colonoscopy as well

## 2012-01-14 NOTE — Assessment & Plan Note (Signed)
Td 2011 Pneumonia shot 11-07 and today zostavax recommended, Rx provided  Flu shot recommended, see instructions Cscope (incomplete) in  2005 and f/u by  BE: Dx w/  TICS  (done elsewhere, was told normal) , no records, we discussed possibly repeat a colonoscopy because the previous is that he is 8 years ago and he was incomplete. He agreed to be referred.  diet exercise discussed

## 2012-01-15 ENCOUNTER — Encounter: Payer: Self-pay | Admitting: Internal Medicine

## 2012-01-21 ENCOUNTER — Encounter: Payer: Self-pay | Admitting: *Deleted

## 2012-02-14 ENCOUNTER — Other Ambulatory Visit: Payer: Self-pay | Admitting: Internal Medicine

## 2012-02-15 NOTE — Telephone Encounter (Signed)
Refill done.  

## 2012-02-24 ENCOUNTER — Encounter: Payer: Self-pay | Admitting: Gastroenterology

## 2012-03-05 ENCOUNTER — Telehealth: Payer: Self-pay | Admitting: Internal Medicine

## 2012-03-05 NOTE — Telephone Encounter (Signed)
Last creatinine was elevated. Please call patient, needs a BMP, DX hypertension

## 2012-03-07 ENCOUNTER — Encounter: Payer: Self-pay | Admitting: Internal Medicine

## 2012-03-07 NOTE — Telephone Encounter (Signed)
Thank you :)

## 2012-03-07 NOTE — Telephone Encounter (Signed)
Unable to reach pt by phone. Letter mailed informing pt he needs to call the office to schedule an appt.

## 2012-03-08 ENCOUNTER — Encounter: Payer: Medicare Other | Admitting: Internal Medicine

## 2012-03-16 ENCOUNTER — Telehealth: Payer: Self-pay

## 2012-03-16 ENCOUNTER — Ambulatory Visit (INDEPENDENT_AMBULATORY_CARE_PROVIDER_SITE_OTHER): Payer: Medicare Other | Admitting: Gastroenterology

## 2012-03-16 ENCOUNTER — Encounter: Payer: Self-pay | Admitting: Gastroenterology

## 2012-03-16 ENCOUNTER — Other Ambulatory Visit: Payer: Medicare Other

## 2012-03-16 ENCOUNTER — Other Ambulatory Visit (INDEPENDENT_AMBULATORY_CARE_PROVIDER_SITE_OTHER): Payer: Medicare Other

## 2012-03-16 VITALS — BP 120/70 | HR 60 | Ht 73.0 in | Wt 227.0 lb

## 2012-03-16 DIAGNOSIS — R634 Abnormal weight loss: Secondary | ICD-10-CM | POA: Diagnosis not present

## 2012-03-16 DIAGNOSIS — I10 Essential (primary) hypertension: Secondary | ICD-10-CM | POA: Diagnosis not present

## 2012-03-16 DIAGNOSIS — D649 Anemia, unspecified: Secondary | ICD-10-CM

## 2012-03-16 LAB — BASIC METABOLIC PANEL
CO2: 26 mEq/L (ref 19–32)
Calcium: 10.3 mg/dL (ref 8.4–10.5)
Chloride: 105 mEq/L (ref 96–112)
Glucose, Bld: 88 mg/dL (ref 70–99)
Potassium: 4.7 mEq/L (ref 3.5–5.1)
Sodium: 139 mEq/L (ref 135–145)

## 2012-03-16 MED ORDER — PEG-KCL-NACL-NASULF-NA ASC-C 100 G PO SOLR: 1.0000 | Freq: Once | ORAL | Status: DC

## 2012-03-16 NOTE — Patient Instructions (Addendum)
You will be set up for a colonoscopy for anemia, weight loss (LEC MAC sedation), we will work with you and your daughter about the timing of this. You are on Agrylin which puts you at increased risk for bleeding during a colonoscopy and we'll communicate with her primary care physician about holding the medicine prior to the test  You have been scheduled for a colonoscopy with propofol. Please follow written instructions given to you at your visit today.  Please pick up your prep kit at the pharmacy within the next 1-3 days. If you use inhalers (even only as needed) or a CPAP machine, please bring them with you on the day of your procedure.

## 2012-03-16 NOTE — Telephone Encounter (Signed)
No voice mail.

## 2012-03-16 NOTE — Progress Notes (Signed)
HPI: This is a     pleasant 71 year old man whom I am meeting for the first time today.  He believes he has been losing weight, 30 pounds in past 4 months. "my neck is getting smaller."  He thinks he had a colonoscopy in [place] long ago.  He thinks he "may have had a colonoscopy at [facility] or [facility] in the past many years"  Does not remember who did it, when exactly it was, why it was done, where it was done or what exactly was found.  Dr. Larose Kells sent him here to discussss routine colon cancer screening  He has mild intermittent constipation, no overt bleeding.    Blood work 2 months ago shows slightly elevated creatinine at 1.6. Hemoglobin slightly low in the 11s. Normocytic, slightly elevated RDW.  He is on Agrelin for thrombocytosis. We will communicate with his primary care physician about holding this for 5 days prior to colonoscopy.   Review of systems: Pertinent positive and negative review of systems were noted in the above HPI section. Complete review of systems was performed and was otherwise normal.    Past Medical History  Diagnosis Date  . CAD (coronary artery disease)     dx elsewheer in past, no documentation. Non-ischemic myovue 2007  . CHF (congestive heart failure)     dx elsewhere, no documentation. Normal EF by previous echo. Trival AS needs f/u ECHO by 2012  . Diabetes mellitus   . Hypertension   . Sleep apnea, obstructive     at some point used CPAP, was d/c  years ago  . Anemia   . History of thrombocytosis   . Allergic rhinitis   . Edema     R>L leg, u/s 5-12 neg for DVT  . Hemorrhoid     Past Surgical History  Procedure Date  . Foot surgery     right toe surgery    Current Outpatient Prescriptions  Medication Sig Dispense Refill  . acetaminophen (TYLENOL) 325 MG tablet 1-4 by mouth once daily as needed for headache.       . anagrelide (AGRYLIN) 0.5 MG capsule Take 0.5 mg by mouth 2 (two) times daily.      . fluticasone (FLONASE) 50 MCG/ACT nasal spray 2  sprays by Nasal route daily.        . furosemide (LASIX) 40 MG tablet TAKE 3 TABLETS BY MOUTH ONCE DAILY  90 tablet  3  . lisinopril (PRINIVIL,ZESTRIL) 20 MG tablet Take 1 tablet (20 mg total) by mouth daily.  30 tablet  1  . potassium chloride SA (K-DUR,KLOR-CON) 20 MEQ tablet TAKE 1 TABLET BY MOUTH DAILY  30 tablet  3    Allergies as of 03/16/2012  . (No Known Allergies)    Family History  Problem Relation Age of Onset  . Colon cancer Neg Hx   . Prostate cancer Neg Hx   . Heart attack Neg Hx   . Diabetes Neg Hx   . Schizophrenia Son     History   Social History  . Marital Status: Widowed    Spouse Name: N/A    Number of Children: 2  . Years of Education: N/A   Occupational History  . retired    .     Social History Main Topics  . Smoking status: Former Smoker -- 10 years    Types: Cigarettes    Quit date: 05/18/1966  . Smokeless tobacco: Never Used  . Alcohol Use: No  . Drug Use: No  .  Sexually Active: Not on file   Other Topics Concern  . Not on file   Social History Narrative   Moved from [place] 2006Widow 2007Lives w/ sonDiet- trying to eat healthyExercise- very limited        Physical Exam: BP 120/70  Pulse 60  Ht 6\' 1"  (1.854 m)  Wt 227 lb (102.967 kg)  BMI 29.95 kg/m2 Constitutional: generally well-appearing Psychiatric: alert and oriented x3 Eyes: extraocular movements intact Mouth: oral pharynx moist, no lesions Neck: supple no lymphadenopathy Cardiovascular: heart regular rate and rhythm Lungs: clear to auscultation bilaterally Abdomen: soft, nontender, nondistended, no obvious ascites, no peritoneal signs, normal bowel sounds Extremities: no lower extremity edema bilaterally Skin: no lesions on visible extremities    Assessment and plan: 71 y.o. male with   recent weight loss    I cannot tell when he has had his most recent colonoscopy but I think it was at least 8 or 9 years ago. The patient has poor history giving skills.  He is focused  on the fact that his neck looks thinner. It does seem that he has been losing weight and I think getting him up-to-date on cancer screening is a very good idea. We'll proceed with colonoscopy at his soonest convenience with MAC sedation.  He is on Agrelin for thrombocytosis. We will communicate with his primary care physician about holding this for 5 days prior to colonoscopy.

## 2012-03-16 NOTE — Telephone Encounter (Signed)
Message copied by Barron Alvine on Wed Mar 16, 2012  2:32 PM ------      Message from: Owens Loffler P      Created: Wed Mar 16, 2012  2:11 PM       Thanks            Chong Sicilian,      Hold aggrylin 5 days prior to procedure.      Thanks                  ----- Message -----         From: Colon Branch, MD         Sent: 03/16/2012  12:21 PM           To: Milus Banister, MD            Linna Hoff, I think is okay to hold Agrylin  temporarily on [name]      [facility]

## 2012-03-17 ENCOUNTER — Emergency Department (HOSPITAL_COMMUNITY): Payer: Medicare Other

## 2012-03-17 ENCOUNTER — Emergency Department (HOSPITAL_COMMUNITY)
Admission: EM | Admit: 2012-03-17 | Discharge: 2012-03-17 | Disposition: A | Discharge status: Home / Self Care | Payer: Medicare Other | Attending: Emergency Medicine | Admitting: Emergency Medicine

## 2012-03-17 ENCOUNTER — Telehealth: Payer: Self-pay | Admitting: Gastroenterology

## 2012-03-17 ENCOUNTER — Encounter (HOSPITAL_COMMUNITY): Payer: Self-pay | Admitting: *Deleted

## 2012-03-17 DIAGNOSIS — I2510 Atherosclerotic heart disease of native coronary artery without angina pectoris: Secondary | ICD-10-CM | POA: Diagnosis not present

## 2012-03-17 DIAGNOSIS — S298XXA Other specified injuries of thorax, initial encounter: Secondary | ICD-10-CM | POA: Diagnosis not present

## 2012-03-17 DIAGNOSIS — S20229A Contusion of unspecified back wall of thorax, initial encounter: Secondary | ICD-10-CM | POA: Insufficient documentation

## 2012-03-17 DIAGNOSIS — S1093XA Contusion of unspecified part of neck, initial encounter: Secondary | ICD-10-CM | POA: Diagnosis not present

## 2012-03-17 DIAGNOSIS — I509 Heart failure, unspecified: Secondary | ICD-10-CM | POA: Diagnosis not present

## 2012-03-17 DIAGNOSIS — T07XXXA Unspecified multiple injuries, initial encounter: Secondary | ICD-10-CM

## 2012-03-17 DIAGNOSIS — I10 Essential (primary) hypertension: Secondary | ICD-10-CM | POA: Insufficient documentation

## 2012-03-17 DIAGNOSIS — S0510XA Contusion of eyeball and orbital tissues, unspecified eye, initial encounter: Secondary | ICD-10-CM | POA: Diagnosis not present

## 2012-03-17 DIAGNOSIS — T1490XA Injury, unspecified, initial encounter: Secondary | ICD-10-CM | POA: Diagnosis not present

## 2012-03-17 DIAGNOSIS — R229 Localized swelling, mass and lump, unspecified: Secondary | ICD-10-CM | POA: Diagnosis not present

## 2012-03-17 DIAGNOSIS — E119 Type 2 diabetes mellitus without complications: Secondary | ICD-10-CM | POA: Diagnosis not present

## 2012-03-17 DIAGNOSIS — S0003XA Contusion of scalp, initial encounter: Secondary | ICD-10-CM | POA: Diagnosis not present

## 2012-03-17 DIAGNOSIS — G4733 Obstructive sleep apnea (adult) (pediatric): Secondary | ICD-10-CM | POA: Diagnosis not present

## 2012-03-17 DIAGNOSIS — Z87891 Personal history of nicotine dependence: Secondary | ICD-10-CM | POA: Insufficient documentation

## 2012-03-17 DIAGNOSIS — S01501A Unspecified open wound of lip, initial encounter: Secondary | ICD-10-CM | POA: Diagnosis not present

## 2012-03-17 DIAGNOSIS — Z79899 Other long term (current) drug therapy: Secondary | ICD-10-CM | POA: Insufficient documentation

## 2012-03-17 DIAGNOSIS — T148XXA Other injury of unspecified body region, initial encounter: Secondary | ICD-10-CM | POA: Diagnosis not present

## 2012-03-17 DIAGNOSIS — R079 Chest pain, unspecified: Secondary | ICD-10-CM | POA: Diagnosis not present

## 2012-03-17 DIAGNOSIS — R609 Edema, unspecified: Secondary | ICD-10-CM | POA: Insufficient documentation

## 2012-03-17 DIAGNOSIS — IMO0002 Reserved for concepts with insufficient information to code with codable children: Secondary | ICD-10-CM | POA: Diagnosis not present

## 2012-03-17 DIAGNOSIS — D649 Anemia, unspecified: Secondary | ICD-10-CM | POA: Insufficient documentation

## 2012-03-17 MED ORDER — HYDROCODONE-ACETAMINOPHEN 5-325 MG PO TABS: 1.0000 | ORAL_TABLET | ORAL | Status: DC | PRN

## 2012-03-17 MED ORDER — HYDROCODONE-ACETAMINOPHEN 5-325 MG PO TABS
2.0000 | ORAL_TABLET | Freq: Once | ORAL | Status: AC
  Administered 2012-03-17: 2 via ORAL
  Filled 2012-03-17: qty 2

## 2012-03-17 NOTE — Telephone Encounter (Signed)
I tried to reach the pt and his phone was turned off, the pt was given a coupon for a free unit of movi prep at his office visit yesterday.

## 2012-03-17 NOTE — ED Notes (Signed)
Social work called per pt request

## 2012-03-17 NOTE — Telephone Encounter (Signed)
I have been unable to reach the pt by phone I have mailed a letter to the pt regarding his anti coag
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
## 2012-03-17 NOTE — ED Notes (Signed)
Social worker at bedside.
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
## 2012-03-17 NOTE — ED Notes (Signed)
Pt. Given a urinal.
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
## 2012-03-18 ENCOUNTER — Other Ambulatory Visit: Payer: Self-pay | Admitting: Internal Medicine

## 2012-03-18 ENCOUNTER — Telehealth: Payer: Self-pay | Admitting: Internal Medicine

## 2012-03-18 NOTE — Telephone Encounter (Signed)
Refill done.  

## 2012-03-18 NOTE — Telephone Encounter (Signed)
Spoke with pharmacy. They are no longer covering the anagrelide 0.5mg . With out the care it will cost the pt $81. Is there another option? Please advise.

## 2012-03-18 NOTE — Telephone Encounter (Signed)
Pt phone is currently turned off no voice mail

## 2012-03-18 NOTE — Telephone Encounter (Signed)
[facility] fax came in stating that they are no longer accepting the [facility] discount card. Pt cannot afford rx, pls advise pharm.

## 2012-03-21 ENCOUNTER — Telehealth: Payer: Self-pay | Admitting: Gastroenterology

## 2012-03-21 ENCOUNTER — Encounter: Payer: Self-pay | Admitting: *Deleted

## 2012-03-21 NOTE — Telephone Encounter (Signed)
Called pt, unable to leave a msg.

## 2012-03-21 NOTE — Telephone Encounter (Signed)
Pt phone has been turned off and no voice mail available

## 2012-03-21 NOTE — Telephone Encounter (Signed)
The patient carries a diagnosis of thrombocytosis, as most of his medical problems, we don't have documentation. Plan: Discontinue Agrylin   Start aspirin 81 mg twice a day Arrange a hematology referral--->  DX essential thrombocytosis.

## 2012-03-22 NOTE — Telephone Encounter (Signed)
Pt procedure has been cx I have still been unable to reach the pt regarding rescheduling the procedure

## 2012-03-22 NOTE — Telephone Encounter (Signed)
Tried calling pt, unable to leave a msg.

## 2012-03-23 ENCOUNTER — Ambulatory Visit: Payer: Medicare Other

## 2012-03-23 ENCOUNTER — Telehealth: Payer: Self-pay | Admitting: *Deleted

## 2012-03-23 MED ORDER — ANAGRELIDE HCL 0.5 MG PO CAPS: 0.5000 mg | ORAL_CAPSULE | Freq: Two times a day (BID) | ORAL | Status: DC

## 2012-03-23 NOTE — Telephone Encounter (Signed)
Pt came into the office & wanted to know why his med has not been refilled. I explained & showed the pt where I have been trying to get in touch with him. He stated that he doesn't always have his cell phone on. I explained to the pt the reason why Dr. Larose Kells has not refilled his med. Pt stated that "we did not understand" & that he needed that med. I spoke with Dr. Larose Kells & he advised me to go ahead & refill the med upon the pt's request.

## 2012-03-23 NOTE — Telephone Encounter (Signed)
Pt phone has been turned off

## 2012-03-23 NOTE — Telephone Encounter (Signed)
Error

## 2012-03-26 ENCOUNTER — Emergency Department (HOSPITAL_COMMUNITY): Payer: Medicare Other

## 2012-03-26 ENCOUNTER — Emergency Department (HOSPITAL_COMMUNITY)
Admission: EM | Admit: 2012-03-26 | Discharge: 2012-03-26 | Disposition: A | Discharge status: Home / Self Care | Payer: Medicare Other | Attending: Emergency Medicine | Admitting: Emergency Medicine

## 2012-03-26 ENCOUNTER — Encounter (HOSPITAL_COMMUNITY): Payer: Self-pay

## 2012-03-26 DIAGNOSIS — R0602 Shortness of breath: Secondary | ICD-10-CM | POA: Diagnosis not present

## 2012-03-26 DIAGNOSIS — I509 Heart failure, unspecified: Secondary | ICD-10-CM | POA: Diagnosis not present

## 2012-03-26 DIAGNOSIS — Z79899 Other long term (current) drug therapy: Secondary | ICD-10-CM | POA: Insufficient documentation

## 2012-03-26 DIAGNOSIS — R5381 Other malaise: Secondary | ICD-10-CM | POA: Insufficient documentation

## 2012-03-26 DIAGNOSIS — Z87891 Personal history of nicotine dependence: Secondary | ICD-10-CM | POA: Diagnosis not present

## 2012-03-26 DIAGNOSIS — I2510 Atherosclerotic heart disease of native coronary artery without angina pectoris: Secondary | ICD-10-CM | POA: Diagnosis not present

## 2012-03-26 DIAGNOSIS — E119 Type 2 diabetes mellitus without complications: Secondary | ICD-10-CM | POA: Diagnosis not present

## 2012-03-26 DIAGNOSIS — Z86718 Personal history of other venous thrombosis and embolism: Secondary | ICD-10-CM | POA: Insufficient documentation

## 2012-03-26 DIAGNOSIS — G4733 Obstructive sleep apnea (adult) (pediatric): Secondary | ICD-10-CM | POA: Diagnosis not present

## 2012-03-26 DIAGNOSIS — R634 Abnormal weight loss: Secondary | ICD-10-CM | POA: Diagnosis not present

## 2012-03-26 DIAGNOSIS — D649 Anemia, unspecified: Secondary | ICD-10-CM | POA: Diagnosis not present

## 2012-03-26 DIAGNOSIS — R5383 Other fatigue: Secondary | ICD-10-CM | POA: Insufficient documentation

## 2012-03-26 DIAGNOSIS — R079 Chest pain, unspecified: Secondary | ICD-10-CM | POA: Diagnosis not present

## 2012-03-26 DIAGNOSIS — K5900 Constipation, unspecified: Secondary | ICD-10-CM | POA: Diagnosis not present

## 2012-03-26 DIAGNOSIS — I10 Essential (primary) hypertension: Secondary | ICD-10-CM | POA: Insufficient documentation

## 2012-03-26 LAB — CBC WITH DIFFERENTIAL/PLATELET
Basophils Absolute: 0.4 10*3/uL — ABNORMAL HIGH (ref 0.0–0.1)
Eosinophils Absolute: 0.1 10*3/uL (ref 0.0–0.7)
Hemoglobin: 9.5 g/dL — ABNORMAL LOW (ref 13.0–17.0)
Lymphocytes Relative: 30 % (ref 12–46)
MCHC: 32.1 g/dL (ref 30.0–36.0)
Neutrophils Relative %: 50 % (ref 43–77)
RDW: 19.8 % — ABNORMAL HIGH (ref 11.5–15.5)

## 2012-03-26 LAB — URINALYSIS, ROUTINE W REFLEX MICROSCOPIC
Glucose, UA: NEGATIVE mg/dL
Hgb urine dipstick: NEGATIVE
Ketones, ur: NEGATIVE mg/dL
Protein, ur: NEGATIVE mg/dL

## 2012-03-26 LAB — COMPREHENSIVE METABOLIC PANEL
AST: 27 U/L (ref 0–37)
Albumin: 3.2 g/dL — ABNORMAL LOW (ref 3.5–5.2)
Alkaline Phosphatase: 109 U/L (ref 39–117)
Chloride: 104 mEq/L (ref 96–112)
Potassium: 4.5 mEq/L (ref 3.5–5.1)
Sodium: 139 mEq/L (ref 135–145)
Total Bilirubin: 0.3 mg/dL (ref 0.3–1.2)

## 2012-03-26 LAB — OCCULT BLOOD, POC DEVICE: Fecal Occult Bld: NEGATIVE

## 2012-03-26 MED ORDER — FERROUS SULFATE 325 (65 FE) MG PO TABS: 325.0000 mg | ORAL_TABLET | Freq: Every day | ORAL | Status: DC

## 2012-03-26 NOTE — ED Notes (Signed)
Physician collected stool sample for Occult stool.

## 2012-03-26 NOTE — ED Notes (Signed)
Feels faint. States bone in chest sticks out. Clothes seem to large due to weight loss. Neck is smaller.
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
## 2012-03-27 ENCOUNTER — Telehealth: Payer: Self-pay | Admitting: Internal Medicine
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
## 2012-03-28 NOTE — Telephone Encounter (Signed)
Called pt, unable to leave a msg.

## 2012-03-29 ENCOUNTER — Encounter: Payer: Self-pay | Admitting: *Deleted
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
## 2012-03-31 ENCOUNTER — Encounter (HOSPITAL_COMMUNITY): Admission: RE | Payer: Self-pay | Source: Ambulatory Visit

## 2012-03-31 ENCOUNTER — Ambulatory Visit (HOSPITAL_COMMUNITY): Admission: RE | Admit: 2012-03-31 | Payer: Medicare Other | Source: Ambulatory Visit | Admitting: Gastroenterology

## 2012-03-31 SURGERY — COLONOSCOPY WITH PROPOFOL
Anesthesia: Monitor Anesthesia Care

## 2012-04-04 NOTE — Telephone Encounter (Signed)
Pt called the office after receiving the letter in the mail. Pt states he is feeling much better & he said that he was told in the ER that he may not need a colonoscopy. Pt states he would like to wait to discuss everything with Dr. Larose Kells at his appt on 12.19.13.

## 2012-05-05 ENCOUNTER — Encounter: Payer: Self-pay | Admitting: Internal Medicine

## 2012-05-05 ENCOUNTER — Ambulatory Visit (INDEPENDENT_AMBULATORY_CARE_PROVIDER_SITE_OTHER): Payer: Medicare Other | Admitting: Internal Medicine

## 2012-05-05 VITALS — BP 122/80 | HR 71 | Temp 98.0°F | Wt 221.0 lb

## 2012-05-05 DIAGNOSIS — E538 Deficiency of other specified B group vitamins: Secondary | ICD-10-CM | POA: Diagnosis not present

## 2012-05-05 DIAGNOSIS — D649 Anemia, unspecified: Secondary | ICD-10-CM | POA: Diagnosis not present

## 2012-05-05 DIAGNOSIS — Z9119 Patient's noncompliance with other medical treatment and regimen: Secondary | ICD-10-CM

## 2012-05-05 DIAGNOSIS — R634 Abnormal weight loss: Secondary | ICD-10-CM

## 2012-05-05 DIAGNOSIS — Z23 Encounter for immunization: Secondary | ICD-10-CM | POA: Diagnosis not present

## 2012-05-05 LAB — VITAMIN B12: Vitamin B-12: 256 pg/mL (ref 211–911)

## 2012-05-05 LAB — FOLATE: Folate: 5.8 ng/mL — ABNORMAL LOW (ref >5.9)

## 2012-05-05 NOTE — Assessment & Plan Note (Addendum)
CBC at the ER sure with worsening mild anemia. hemocult was neg  Review of systems positive for occasional diarrhea and ill-defined lower abdominal pain. Colonoscopy was canceled. Last B12 in the lower side of normal. DDX includes a nutritional issue, microscopic  colitis, colon polyps, colon CA and others  Plan: CBC, iron, ferritin, B12 to r/o B12 def Re-refer for a colonoscopy --> refuse to have a colonoscopy. See "poor compliance"; will refer to GI anyway and ask for a schedule   colonoscopy for 06/2012; he likes the Cscope to be done in the hospital (overnight stay?)

## 2012-05-05 NOTE — Assessment & Plan Note (Addendum)
Gradual, mild, ongoing weight loss. Last TSH and A1c normal. He also has some diarrhea and abdominal pain, see anemia. See previous entry, this may be due to to a financial issue and food availability. Recommend to contact social services, be sure he get food stamps, meals on wheels? States he does not like to go that route at this point.

## 2012-05-05 NOTE — Assessment & Plan Note (Addendum)
The patient strongly refused to have a colonoscopy ASAP, states "I want to do it in February when I am stronger". I had a long conversation about this issue, I told him that he needs a colonoscopy to rule out serious causes of anemia, I explained that in very simple terms, the patient again strongly refused. "The hospital run all sort of tests and they told me to wait until I am stronger to get a colonoscopy" (a missunderstanding of the conversation he had with the ER physician) I will make a referral to GI to scheduled in advance a colonoscopy for 06-2012

## 2012-05-05 NOTE — Patient Instructions (Addendum)
Please come back in 2 months
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
## 2012-05-05 NOTE — Addendum Note (Signed)
Addended by: Douglass Rivers T on: 05/05/2012 04:21 PM   Modules accepted: Orders

## 2012-05-06 ENCOUNTER — Telehealth: Payer: Self-pay

## 2012-05-06 LAB — CBC WITH DIFFERENTIAL/PLATELET
Basophils Absolute: 0.1 10*3/uL (ref 0.0–0.1)
Hemoglobin: 10.5 g/dL — ABNORMAL LOW (ref 13.0–17.0)
Lymphocytes Relative: 27.7 % (ref 12.0–46.0)
Monocytes Relative: 7.1 % (ref 3.0–12.0)
Neutro Abs: 7.1 10*3/uL (ref 1.4–7.7)
Neutrophils Relative %: 59.6 % (ref 43.0–77.0)
RDW: 20.5 % — ABNORMAL HIGH (ref 11.5–14.6)

## 2012-05-06 NOTE — Telephone Encounter (Signed)
Message copied by Barron Alvine on Fri May 06, 2012  8:12 AM ------      Message from: Milus Banister      Created: Fri May 06, 2012  7:02 AM       Nils Pyle work on it.              Jesslyn Viglione,      Can you set up colonoscopy on Feb 6 or Feb 13th.  (those are thursdays, + propofol, at [facility] for anemia, hem pos stool).  Can plan to admit pt for observation following the procedure however he needs to know that he may be billed by his ins company for that observation admission since it is being done for transportation issues.            Thanks                        ----- Message -----         From: Colon Branch, MD         Sent: 05/05/2012   3:27 PM           To: Milus Banister, MD            Dan, the pt cancelled a cscope, now his anemia is slt worse; I am trying to reschedule a colonoscopy, he is quite hesitant. He said he would do a colonoscopy by 06-2012.      He wonders if it could be done as an inpatient, I think he likes to stay overnight  because lack of transportation.      Thank you, please let me know if this needs further explanation.      [employer]

## 2012-05-06 NOTE — Telephone Encounter (Signed)
Message copied by Barron Alvine on Fri May 06, 2012  3:56 PM ------      Message from: [name] E      Created: Fri May 06, 2012  3:45 PM       Please confirm with the patient that the problems is transportation      Thanks,JP      ----- Message -----         From: Milus Banister, MD         Sent: 05/06/2012   7:02 AM           To: Colon Branch, MD, Barron Alvine, [place],      [place] work on it.              Elizabth Palka,      Can you set up colonoscopy on Feb 6 or Feb 13th.  (those are thursdays, + propofol, at [facility] for anemia, hem pos stool).  Can plan to admit pt for observation following the procedure however he needs to know that he may be billed by his ins company for that observation admission since it is being done for transportation issues.            Thanks                        ----- Message -----         From: Colon Branch, MD         Sent: 05/05/2012   3:27 PM           To: Milus Banister, MD            Dan, the pt cancelled a cscope, now his anemia is slt worse; I am trying to reschedule a colonoscopy, he is quite hesitant. He said he would do a colonoscopy by 06-2012.      He wonders if it could be done as an inpatient, I think he likes to stay overnight  because lack of transportation.      Thank you, please let me know if this needs further explanation.      [employer]

## 2012-05-06 NOTE — Telephone Encounter (Signed)
Unable to reach pt by phone and voice message system

## 2012-05-09 ENCOUNTER — Encounter: Payer: Self-pay | Admitting: *Deleted

## 2012-05-09 MED ORDER — FOLIC ACID 1 MG PO TABS: 1.0000 mg | ORAL_TABLET | Freq: Every day | ORAL | Status: DC

## 2012-05-09 NOTE — Telephone Encounter (Signed)
Unable to reach pt letter mailed 

## 2012-05-09 NOTE — Addendum Note (Signed)
Addended by: Douglass Rivers T on: 05/09/2012 08:53 AM   Modules accepted: Orders

## 2012-05-09 NOTE — Telephone Encounter (Signed)
Left message on machine to call back  

## 2012-05-13 ENCOUNTER — Other Ambulatory Visit: Payer: Self-pay | Admitting: Internal Medicine

## 2012-05-13 ENCOUNTER — Ambulatory Visit: Payer: Medicare Other | Admitting: Gastroenterology

## 2012-05-13 NOTE — Telephone Encounter (Signed)
Refill done.  

## 2012-05-16 ENCOUNTER — Telehealth: Payer: Self-pay

## 2012-05-16 NOTE — Telephone Encounter (Signed)
The pt called and states he wants to wait to schedule the appt until the last of Feb he does not want to schedule until he feels better and is more able to think about having this done.  He will call back when he is ready

## 2012-06-13 ENCOUNTER — Ambulatory Visit: Payer: Medicare Other | Admitting: Gastroenterology

## 2012-06-16 ENCOUNTER — Other Ambulatory Visit: Payer: Self-pay | Admitting: Internal Medicine

## 2012-06-16 NOTE — Telephone Encounter (Signed)
Refill done.  

## 2012-07-07 ENCOUNTER — Ambulatory Visit (INDEPENDENT_AMBULATORY_CARE_PROVIDER_SITE_OTHER): Payer: Medicare Other | Admitting: Internal Medicine

## 2012-07-07 ENCOUNTER — Encounter: Payer: Self-pay | Admitting: Internal Medicine

## 2012-07-07 VITALS — BP 116/72 | HR 80 | Temp 98.0°F | Wt 228.0 lb

## 2012-07-07 DIAGNOSIS — D649 Anemia, unspecified: Secondary | ICD-10-CM

## 2012-07-07 DIAGNOSIS — R634 Abnormal weight loss: Secondary | ICD-10-CM | POA: Diagnosis not present

## 2012-07-07 NOTE — Progress Notes (Signed)
  Subjective:    Patient ID: Juan [name], male    DOB: Nov 05, 1940, 72 y.o.   MRN: [id number]  HPI Followup from previous visit Reports that he continue with occasional nausea and vomiting. Still feeling weak. Still has some diarrhea, color change from light to dark.  Past Medical History  Diagnosis Date  . CAD (coronary artery disease)     dx elsewheer in past, no documentation. Non-ischemic myovue 2007  . CHF (congestive heart failure)     dx elsewhere, no documentation. Normal EF by previous echo. Trival AS needs f/u ECHO by 2012  . Diabetes mellitus   . Hypertension   . Sleep apnea, obstructive     at some point used CPAP, was d/c  years ago  . Anemia   . History of thrombocytosis   . Allergic rhinitis   . Edema     R>L leg, u/s 5-12 neg for DVT  . Hemorrhoid    Past Surgical History  Procedure Laterality Date  . Foot surgery      right toe surgery    Review of Systems His appetite remains normal, he states that he does have food available. Denies hematemesis or blood in the stools. Continue with the ill-defined lower abdominal pain.     Objective:   Physical Exam  General -- alert, well-developed, has gained 7 pounds since the last time I saw him 2 months ago Neck --no JVD at 45 Lungs -- normal respiratory effort, no intercostal retractions, no accessory muscle use, and normal breath sounds.   Heart-- normal rate, regular rhythm, no murmur, and no gallop.   Abdomen--soft, nondistended, not actually tender in the lower abdomen. No mass..   Extremities-- trace pretibial edema bilaterally  Psych-- Cognition and judgment appear intact. Alert and cooperative with normal attention span and concentration.  not anxious appearing and not depressed appearing.       Assessment & Plan:

## 2012-07-07 NOTE — Assessment & Plan Note (Signed)
has gained 7 pounds since last time I saw him 2 months ago, other than trace bilateral lower extremity edema there is no evidence of volume overload.

## 2012-07-07 NOTE — Assessment & Plan Note (Signed)
Last hemoglobin was improving, he is taking vitamins and iron OTC. I again recommend him to have a colonoscopy given the ongoing GI symptoms. He states he's not ready. "They tell me not to have it just yet, I am too weak" . I told him in very clear and easy terms that in my opinion he is ready to have it, and that the differential diagnosis includes colon cancer. He again declined, I have to respect his wishes. Followup in 2 months.

## 2012-07-14 ENCOUNTER — Other Ambulatory Visit: Payer: Self-pay | Admitting: Internal Medicine

## 2012-07-14 NOTE — Telephone Encounter (Signed)
Refill done.  

## 2012-07-15 ENCOUNTER — Encounter: Payer: Self-pay | Admitting: Cardiovascular Disease

## 2012-07-15 ENCOUNTER — Ambulatory Visit (INDEPENDENT_AMBULATORY_CARE_PROVIDER_SITE_OTHER): Payer: Medicare Other | Admitting: Cardiovascular Disease

## 2012-07-15 VITALS — BP 124/68 | HR 64 | Ht 75.0 in | Wt 232.4 lb

## 2012-07-15 DIAGNOSIS — R609 Edema, unspecified: Secondary | ICD-10-CM

## 2012-07-15 DIAGNOSIS — I2510 Atherosclerotic heart disease of native coronary artery without angina pectoris: Secondary | ICD-10-CM | POA: Diagnosis not present

## 2012-07-15 DIAGNOSIS — E119 Type 2 diabetes mellitus without complications: Secondary | ICD-10-CM

## 2012-07-15 DIAGNOSIS — I10 Essential (primary) hypertension: Secondary | ICD-10-CM

## 2012-07-15 DIAGNOSIS — D649 Anemia, unspecified: Secondary | ICD-10-CM

## 2012-07-15 NOTE — Assessment & Plan Note (Signed)
Discussed low carb diet.  Target hemoglobin A1c is 6.5 or less.  Continue current medications.  

## 2012-07-15 NOTE — Assessment & Plan Note (Signed)
Stable with no angina and good activity level.  Continue medical Rx  

## 2012-07-15 NOTE — Assessment & Plan Note (Signed)
Continue lasix elevate legs end of day Related to venous insuficiency not CHF

## 2012-07-15 NOTE — Assessment & Plan Note (Signed)
Well controlled.  Continue current medications and low sodium Dash type diet.    

## 2012-07-15 NOTE — Progress Notes (Signed)
Patient ID: Juan [name], male   DOB: 1940/07/03, 72 y.o.   MRN: [id number] Juan [name] is seen today for F/U of HTN, and edema. He carries a Dx of CAD and CHF. There has never been any documentation of this. He indicated these problems when he moved here from [place]> He had a normal myovue and echo in 2007. He is a poor historian but denies PND, orthopnea and his edema is improved. He has mild chronic fatigue and exertional dyspnea. He denies cough or sputum. He has been compliant with his meds. His son lives with him and has some psycholigical issues. This is a little stressful for him  Echo 1/11 showed normal EF 55-60%  ECG reviewed today NSR 70 Normal  Compliant with meds no new heart issues  Has anemia being seen by Dr Juan [name]   ROS: Denies fever, malais, weight loss, blurry vision, decreased visual acuity, cough, sputum, SOB, hemoptysis, pleuritic pain, palpitaitons, heartburn, abdominal pain, melena, lower extremity edema, claudication, or rash.  All other systems reviewed and negative  General: Affect appropriate Overweight black male HEENT: normal Neck supple with no adenopathy JVP normal no bruits no thyromegaly Lungs clear with no wheezing and good diaphragmatic motion Heart:  S1/S2 no murmur, no rub, gallop or click PMI normal Abdomen: benighn, BS positve, no tenderness, no AAA no bruit.  No HSM or HJR Distal pulses intact with no bruits Plus 2 bilateral  edema Neuro non-focal Skin warm and dry No muscular weakness   Current Outpatient Prescriptions  Medication Sig Dispense Refill  . acetaminophen (TYLENOL) 325 MG tablet Take 325-650 mg by mouth every 4 (four) hours as needed. For headache      . anagrelide (AGRYLIN) 0.5 MG capsule Take 1 capsule (0.5 mg total) by mouth 2 (two) times daily.  30 capsule  0  . Ferrous Sulfate Dried 200 (65 FE) MG TABS Take 1 capsule by mouth daily.       . fluticasone (FLONASE) 50 MCG/ACT nasal spray 2 sprays by Nasal route daily.        . folic acid  (FOLVITE) 1 MG tablet Take 1 tablet (1 mg total) by mouth daily.  30 tablet  3  . furosemide (LASIX) 40 MG tablet TAKE 3 TABLETS BY MOUTH ONCE DAILY  90 tablet  6  . lisinopril (PRINIVIL,ZESTRIL) 20 MG tablet Take 1 tablet (20 mg total) by mouth daily.  30 tablet  1  . potassium chloride SA (K-DUR,KLOR-CON) 20 MEQ tablet TAKE 1 TABLET BY MOUTH DAILY  30 tablet  6   No current facility-administered medications for this visit.    Allergies  Review of patient's allergies indicates no known allergies.  Electrocardiogram:  03/27/12  SR rate 61  normal  Assessment and Plan

## 2012-07-15 NOTE — Patient Instructions (Addendum)
Continue taking your current medications as prescribed.  Your physician wants you to follow-up in: 1 year with Dr. Blima Singer will receive a reminder letter in the mail two months in advance. If you don't receive a letter, please call our office to schedule the follow-up appointment.

## 2012-07-15 NOTE — Assessment & Plan Note (Signed)
Started on folic acid Not microcytic F/U Dr Larose Kells

## 2012-07-29 ENCOUNTER — Telehealth: Payer: Self-pay | Admitting: Internal Medicine

## 2012-07-29 NOTE — Telephone Encounter (Signed)
In reference to Gastroenterology referral back from 05/05/12, [facility], and I have been trying to get the patient to schedule an appointment with no success.  Every time I have called and spoke with patient, he states he is just too sick right now and has to put this off for a while.  Also see 12/20 and 05/16/12 phone notes.

## 2012-07-29 NOTE — Telephone Encounter (Signed)
Noted, will have to discuss further on RTC, thank you

## 2012-09-08 ENCOUNTER — Ambulatory Visit (INDEPENDENT_AMBULATORY_CARE_PROVIDER_SITE_OTHER): Payer: Medicare Other | Admitting: Internal Medicine

## 2012-09-08 ENCOUNTER — Encounter: Payer: Self-pay | Admitting: Internal Medicine

## 2012-09-08 VITALS — BP 138/74 | HR 60 | Temp 98.0°F | Wt 247.0 lb

## 2012-09-08 DIAGNOSIS — D649 Anemia, unspecified: Secondary | ICD-10-CM | POA: Diagnosis not present

## 2012-09-08 DIAGNOSIS — R634 Abnormal weight loss: Secondary | ICD-10-CM | POA: Diagnosis not present

## 2012-09-08 DIAGNOSIS — I10 Essential (primary) hypertension: Secondary | ICD-10-CM | POA: Diagnosis not present

## 2012-09-08 LAB — CBC WITH DIFFERENTIAL/PLATELET
Basophils Absolute: 0.2 K/uL — ABNORMAL HIGH (ref 0.0–0.1)
Basophils Relative: 2 % (ref 0.0–3.0)
Eosinophils Absolute: 0.3 K/uL (ref 0.0–0.7)
Eosinophils Relative: 2.6 % (ref 0.0–5.0)
HCT: 31.3 % — ABNORMAL LOW (ref 39.0–52.0)
Hemoglobin: 10.3 g/dL — ABNORMAL LOW (ref 13.0–17.0)
Lymphocytes Relative: 24.9 % (ref 12.0–46.0)
Lymphs Abs: 2.9 K/uL (ref 0.7–4.0)
MCHC: 32.8 g/dL (ref 30.0–36.0)
MCV: 81.1 fl (ref 78.0–100.0)
Monocytes Absolute: 1.4 K/uL — ABNORMAL HIGH (ref 0.1–1.0)
Monocytes Relative: 11.8 % (ref 3.0–12.0)
Neutro Abs: 6.8 K/uL (ref 1.4–7.7)
Neutrophils Relative %: 58.7 % (ref 43.0–77.0)
Platelets: 183 K/uL (ref 150.0–400.0)
RBC: 3.86 Mil/uL — ABNORMAL LOW (ref 4.22–5.81)
RDW: 20.7 % — ABNORMAL HIGH (ref 11.5–14.6)
WBC: 11.5 K/uL — ABNORMAL HIGH (ref 4.5–10.5)

## 2012-09-08 LAB — BASIC METABOLIC PANEL
CO2: 27 mEq/L (ref 19–32)
Chloride: 106 mEq/L (ref 96–112)
Creatinine, Ser: 1.1 mg/dL (ref 0.4–1.5)
Potassium: 4.6 mEq/L (ref 3.5–5.1)
Sodium: 139 mEq/L (ref 135–145)

## 2012-09-08 NOTE — Assessment & Plan Note (Signed)
Gaining weight, not an issue at this point

## 2012-09-08 NOTE — Assessment & Plan Note (Addendum)
See previous entries, still feels is not ready for a cscope. Very adamant about not having a cscope . Check a CBC

## 2012-09-08 NOTE — Assessment & Plan Note (Signed)
Seems well controlled , check a BMP

## 2012-09-08 NOTE — Progress Notes (Signed)
  Subjective:    Patient ID: Juan [name], male    DOB: Aug 01, 1940, 72 y.o.   MRN: [id number]  HPI Followup No new concerns or symptoms, still feeling tired and weak sometimes. Occasional diarrhea and  abdominal pain.  Past Medical History  Diagnosis Date  . CAD (coronary artery disease)     dx elsewheer in past, no documentation. Non-ischemic myovue 2007  . CHF (congestive heart failure)     dx elsewhere, no documentation. Normal EF by previous echo. Trival AS needs f/u ECHO by 2012  . Diabetes mellitus   . Hypertension   . Sleep apnea, obstructive     at some point used CPAP, was d/c  years ago  . Anemia   . History of thrombocytosis   . Allergic rhinitis   . Edema     R>L leg, u/s 5-12 neg for DVT  . Hemorrhoid    Past Surgical History  Procedure Laterality Date  . Foot surgery      right toe surgery     Review of Systems We review his medications, good compliance. Occasionally feels cold but denies fevers. Occasional nausea and even vomiting but no hematemesis. Occasional upper abdominal discomfort, not daily. Stools are nonbloody. Reports no problems w/ food acces     Objective:   Physical Exam  BP 138/74  Pulse 60  Temp(Src) 98 F (36.7 C) (Oral)  Wt 247 lb (112.038 kg)  BMI 30.87 kg/m2  SpO2 98% General -- alert, well-developed, No physical or emotional distress.   Lungs -- normal respiratory effort, no intercostal retractions, no accessory muscle use, and normal breath sounds.   Heart-- normal rate, regular rhythm, no murmur, and no gallop.   Abdomen--soft, non-tender, no distention, no masses, no HSM, no guarding, and no rigidity.   Extremities-- trace pretibial edema bilaterally Psych--  not anxious appearing and not depressed appearing.       Assessment & Plan:

## 2012-09-13 ENCOUNTER — Encounter: Payer: Self-pay | Admitting: *Deleted

## 2012-09-14 ENCOUNTER — Other Ambulatory Visit: Payer: Self-pay | Admitting: Internal Medicine

## 2012-09-14 NOTE — Telephone Encounter (Signed)
Refill done.  

## 2012-11-13 ENCOUNTER — Other Ambulatory Visit: Payer: Self-pay | Admitting: Internal Medicine

## 2012-11-14 NOTE — Telephone Encounter (Signed)
Refill done.  

## 2012-11-30 ENCOUNTER — Emergency Department (HOSPITAL_COMMUNITY)
Admission: EM | Admit: 2012-11-30 | Discharge: 2012-12-01 | Disposition: A | Discharge status: Home / Self Care | Payer: Medicare Other | Attending: Emergency Medicine | Admitting: Emergency Medicine

## 2012-11-30 ENCOUNTER — Encounter (HOSPITAL_COMMUNITY): Payer: Self-pay | Admitting: Emergency Medicine

## 2012-11-30 ENCOUNTER — Emergency Department (HOSPITAL_COMMUNITY): Payer: Medicare Other

## 2012-11-30 DIAGNOSIS — I10 Essential (primary) hypertension: Secondary | ICD-10-CM | POA: Diagnosis not present

## 2012-11-30 DIAGNOSIS — E119 Type 2 diabetes mellitus without complications: Secondary | ICD-10-CM | POA: Diagnosis not present

## 2012-11-30 DIAGNOSIS — I872 Venous insufficiency (chronic) (peripheral): Secondary | ICD-10-CM | POA: Diagnosis not present

## 2012-11-30 DIAGNOSIS — R079 Chest pain, unspecified: Secondary | ICD-10-CM | POA: Diagnosis not present

## 2012-11-30 DIAGNOSIS — I509 Heart failure, unspecified: Secondary | ICD-10-CM | POA: Insufficient documentation

## 2012-11-30 DIAGNOSIS — Z79899 Other long term (current) drug therapy: Secondary | ICD-10-CM | POA: Insufficient documentation

## 2012-11-30 DIAGNOSIS — R404 Transient alteration of awareness: Secondary | ICD-10-CM | POA: Diagnosis not present

## 2012-11-30 DIAGNOSIS — D649 Anemia, unspecified: Secondary | ICD-10-CM | POA: Insufficient documentation

## 2012-11-30 DIAGNOSIS — IMO0002 Reserved for concepts with insufficient information to code with codable children: Secondary | ICD-10-CM | POA: Diagnosis not present

## 2012-11-30 DIAGNOSIS — Z8679 Personal history of other diseases of the circulatory system: Secondary | ICD-10-CM | POA: Insufficient documentation

## 2012-11-30 DIAGNOSIS — Z87891 Personal history of nicotine dependence: Secondary | ICD-10-CM | POA: Diagnosis not present

## 2012-11-30 DIAGNOSIS — Z8669 Personal history of other diseases of the nervous system and sense organs: Secondary | ICD-10-CM | POA: Insufficient documentation

## 2012-11-30 DIAGNOSIS — Z862 Personal history of diseases of the blood and blood-forming organs and certain disorders involving the immune mechanism: Secondary | ICD-10-CM | POA: Diagnosis not present

## 2012-11-30 DIAGNOSIS — R609 Edema, unspecified: Secondary | ICD-10-CM | POA: Diagnosis not present

## 2012-11-30 DIAGNOSIS — I2510 Atherosclerotic heart disease of native coronary artery without angina pectoris: Secondary | ICD-10-CM | POA: Diagnosis not present

## 2012-11-30 DIAGNOSIS — Z8709 Personal history of other diseases of the respiratory system: Secondary | ICD-10-CM | POA: Diagnosis not present

## 2012-11-30 DIAGNOSIS — R42 Dizziness and giddiness: Secondary | ICD-10-CM | POA: Diagnosis not present

## 2012-11-30 DIAGNOSIS — M79609 Pain in unspecified limb: Secondary | ICD-10-CM | POA: Diagnosis not present

## 2012-11-30 MED ORDER — MORPHINE SULFATE 4 MG/ML IJ SOLN
6.0000 mg | Freq: Once | INTRAMUSCULAR | Status: AC
  Administered 2012-11-30: 6 mg via INTRAVENOUS
  Filled 2012-11-30: qty 2

## 2012-11-30 NOTE — ED Notes (Signed)
[id number] Expected date:<BR> Expected time:<BR> Means of arrival:<BR> Comments:<BR> EMS, 71yo M; leg swelling and redness

## 2012-11-30 NOTE — ED Notes (Signed)
Per EMS report: pt from home: pt c/o of bilateral swelling that began 2-3 weeks ago.  Pt told by PCP, if the swelling got this bad, pt should go to the hospital.  Pt thinks the legs is infected and pt reports getting infection similar to this every 4-6 years.  Pt also c/o of slight dizziness but no nausea.  EMS observed pt bearing weight on legs and getting onto stretcher.  At baseline, pt is able to walk. On arrival, swelling noted in pt's legs bilaterally with more swelling on the right leg.  Right leg color looks ashen.  Non pitting edema noted.  EMS VS: 170/100

## 2012-11-30 NOTE — ED Provider Notes (Signed)
History    CSN: [id number] Arrival date & time 11/30/12  2241  First MD Initiated Contact with Patient 11/30/12 2301     Chief Complaint  Patient presents with  . Leg Swelling    HPI Patient reports worsening bilateral lower extremity over the past 2-3 weeks.  He denies chest pain or significant shortness of breath.  He reports mild to moderate pain in his lower legs.  He denies fevers and chills.  He's concerned his bilateral lower extremity is maybe infected.  Review the medical record demonstrates that his had bilateral lower extremity swelling before in the past and does not have a history of congestive heart failure as the patient states.  His diagnosis of congestive heart failure sounds like was an defined in [place] however he said a normal echocardiogram by cardiology here.  Cardiology does not believe that his lower extremity swelling secondary to congestive heart failure and that instead this is likely venous insufficiency.  No history of DVT or pulmonary embolism.  No recent illness.  He continues to urinate.  He states that he does not wear compression stockings because he was told by the physicians in [place] that this could hurt his heart.    Past Medical History  Diagnosis Date  . CAD (coronary artery disease)     dx elsewheer in past, no documentation. Non-ischemic myovue 2007  . CHF (congestive heart failure)     dx elsewhere, no documentation. Normal EF by previous echo. Trival AS needs f/u ECHO by 2012  . Diabetes mellitus   . Hypertension   . Sleep apnea, obstructive     at some point used CPAP, was d/c  years ago  . Anemia   . History of thrombocytosis   . Allergic rhinitis   . Edema     R>L leg, u/s 5-12 neg for DVT  . Hemorrhoid    Past Surgical History  Procedure Laterality Date  . Foot surgery      right toe surgery   Family History  Problem Relation Age of Onset  . Colon cancer Neg Hx   . Prostate cancer Neg Hx   . Heart attack Neg Hx   .  Diabetes Neg Hx   . Schizophrenia Son    History  Substance Use Topics  . Smoking status: Former Smoker -- 10 years    Types: Cigarettes    Quit date: 05/18/1966  . Smokeless tobacco: Never Used  . Alcohol Use: No    Review of Systems  All other systems reviewed and are negative.    Allergies  Review of patient's allergies indicates no known allergies.  Home Medications   Current Outpatient Rx  Name  Route  Sig  Dispense  Refill  . acetaminophen (TYLENOL) 325 MG tablet   Oral   Take 325-650 mg by mouth every 4 (four) hours as needed. For headache         . anagrelide (AGRYLIN) 0.5 MG capsule   Oral   Take 1 capsule (0.5 mg total) by mouth 2 (two) times daily.   30 capsule   0   . anagrelide (AGRYLIN) 0.5 MG capsule      TAKE ONE CAPSULE BY MOUTH TWICE DAILY   60 capsule   1   . Ferrous Sulfate Dried 200 (65 FE) MG TABS   Oral   Take 1 capsule by mouth daily.          . fluticasone (FLONASE) 50 MCG/ACT nasal spray  Nasal   2 sprays by Nasal route daily.           . folic acid (FOLVITE) 1 MG tablet   Oral   Take 1 tablet (1 mg total) by mouth daily.   30 tablet   3   . furosemide (LASIX) 40 MG tablet      TAKE 3 TABLETS BY MOUTH ONCE DAILY   90 tablet   6   . lisinopril (PRINIVIL,ZESTRIL) 20 MG tablet   Oral   Take 1 tablet (20 mg total) by mouth daily.   30 tablet   1   . lisinopril (PRINIVIL,ZESTRIL) 20 MG tablet      TAKE ONE TABLET BY MOUTH EVERY DAY   30 tablet   5   . potassium chloride SA (K-DUR,KLOR-CON) 20 MEQ tablet      TAKE 1 TABLET BY MOUTH DAILY   30 tablet   6    BP 137/56  Pulse 66  Temp(Src) 98.2 F (36.8 C) (Oral)  Resp 18  SpO2 96% Physical Exam  Nursing note and vitals reviewed. Constitutional: He is oriented to person, place, and time. He appears well-developed and well-nourished.  HENT:  Head: Normocephalic and atraumatic.  Eyes: EOM are normal.  Neck: Normal range of motion.  Cardiovascular: Normal  rate, regular rhythm, normal heart sounds and intact distal pulses.   Pulmonary/Chest: Effort normal and breath sounds normal. No respiratory distress.  Abdominal: Soft. He exhibits no distension. There is no tenderness.  Genitourinary: Rectum normal.  Musculoskeletal: Normal range of motion.  2+ pitting edema bilaterally.  Right greater than left swelling.  Normal pulses in his bilateral PT and DP arteries.  No significant erythema or warmth.  Changes of chronic venous insufficiency.  Neurological: He is alert and oriented to person, place, and time.  Skin: Skin is warm and dry.  Psychiatric: He has a normal mood and affect. Judgment normal.    ED Course  Procedures (including critical care time)   Date: 12/01/2012  Rate: 64  Rhythm: normal sinus rhythm  QRS Axis: normal  Intervals: normal  ST/T Wave abnormalities: normal  Conduction Disutrbances: none  Narrative Interpretation:   Old EKG Reviewed: No significant changes noted     Labs Reviewed  CBC WITH DIFFERENTIAL - Abnormal; Notable for the following:    RBC 3.61 (*)    Hemoglobin 9.4 (*)    HCT 29.8 (*)    RDW 19.7 (*)    Monocytes Relative 17 (*)    Basophils Relative 7 (*)    Monocytes Absolute 1.8 (*)    Basophils Absolute 0.7 (*)    All other components within normal limits  BASIC METABOLIC PANEL - Abnormal; Notable for the following:    Glucose, Bld 104 (*)    BUN 25 (*)    GFR calc non Af Amer 72 (*)    GFR calc Af Amer 83 (*)    All other components within normal limits  PRO B NATRIURETIC PEPTIDE - Abnormal; Notable for the following:    Pro B Natriuretic peptide (BNP) 502.6 (*)    All other components within normal limits  TROPONIN I   Dg Chest 2 View  11/30/2012   *RADIOLOGY REPORT*  Clinical Data: Leg swelling and chest pain.  CHEST - 2 VIEW  Comparison: 03/26/2012  Findings: Lungs are hypoinflated with mild prominence of the perihilar markings suggesting vascular congestion.  No focal consolidation  or effusion.  Borderline cardiomegaly.  Continued evidence of subtle increased  diffuse bone density.  IMPRESSION: Hypoinflation with findings suggesting mild vascular congestion.  Borderline cardiomegaly.  No change and mild diffuse increased bone density which may be due to metabolic versus neoplastic process.   Original Report Authenticated By: Marin Olp, M.D.   I personally reviewed the imaging tests through PACS system I reviewed available ER/hospitalization records through the EMR   1. Venous insufficiency     MDM  I suspect this is venous insufficiency.  I recommended a low-salt diet, compression stockings, elevation of his legs above the level of his heart.  I think this will significantly improve his symptoms.  He will present to [facility] number morning for bilateral lower extremity duplexes to evaluate for DVT although my suspicion is lower at this time.  Single dose of Lovenox here in the emergency department.  He understands the importance of close PCP followup and return to the ER for new or worsening symptoms.  Review the records demonstrate a long-standing history of lower extremity edema and no significant prior history of congestive heart failure.  The patient reports a history congestive heart failure as diagnosed up in [place] however with followup with his cardiologist he has had normal echocardiogram with normal systolic function and this is not suspected to be CHF related by cardiology.  Hoy Morn, MD 12/01/12 [phone]

## 2012-12-01 ENCOUNTER — Emergency Department (HOSPITAL_COMMUNITY)
Admission: EM | Admit: 2012-12-01 | Discharge: 2012-12-01 | Disposition: A | Discharge status: Home / Self Care | Payer: Medicare Other | Source: Home / Self Care | Attending: Emergency Medicine | Admitting: Emergency Medicine

## 2012-12-01 ENCOUNTER — Encounter (HOSPITAL_COMMUNITY): Payer: Self-pay | Admitting: Emergency Medicine

## 2012-12-01 ENCOUNTER — Other Ambulatory Visit: Payer: Self-pay

## 2012-12-01 ENCOUNTER — Ambulatory Visit (HOSPITAL_COMMUNITY)
Admission: RE | Admit: 2012-12-01 | Discharge: 2012-12-01 | Disposition: A | Discharge status: Home / Self Care | Payer: Medicare Other | Source: Ambulatory Visit | Attending: Emergency Medicine | Admitting: Emergency Medicine

## 2012-12-01 DIAGNOSIS — R609 Edema, unspecified: Secondary | ICD-10-CM

## 2012-12-01 DIAGNOSIS — Z79899 Other long term (current) drug therapy: Secondary | ICD-10-CM | POA: Insufficient documentation

## 2012-12-01 DIAGNOSIS — M7989 Other specified soft tissue disorders: Secondary | ICD-10-CM

## 2012-12-01 DIAGNOSIS — D649 Anemia, unspecified: Secondary | ICD-10-CM | POA: Insufficient documentation

## 2012-12-01 DIAGNOSIS — Z87891 Personal history of nicotine dependence: Secondary | ICD-10-CM | POA: Insufficient documentation

## 2012-12-01 DIAGNOSIS — M79609 Pain in unspecified limb: Secondary | ICD-10-CM

## 2012-12-01 DIAGNOSIS — Z8679 Personal history of other diseases of the circulatory system: Secondary | ICD-10-CM | POA: Insufficient documentation

## 2012-12-01 DIAGNOSIS — E119 Type 2 diabetes mellitus without complications: Secondary | ICD-10-CM | POA: Insufficient documentation

## 2012-12-01 DIAGNOSIS — IMO0002 Reserved for concepts with insufficient information to code with codable children: Secondary | ICD-10-CM | POA: Insufficient documentation

## 2012-12-01 DIAGNOSIS — I10 Essential (primary) hypertension: Secondary | ICD-10-CM | POA: Insufficient documentation

## 2012-12-01 DIAGNOSIS — R600 Localized edema: Secondary | ICD-10-CM

## 2012-12-01 LAB — CBC WITH DIFFERENTIAL/PLATELET
Basophils Relative: 7 % — ABNORMAL HIGH (ref 0–1)
Eosinophils Absolute: 0.3 10*3/uL (ref 0.0–0.7)
Lymphs Abs: 3.1 10*3/uL (ref 0.7–4.0)
MCH: 26 pg (ref 26.0–34.0)
MCHC: 31.5 g/dL (ref 30.0–36.0)
MCV: 82.5 fL (ref 78.0–100.0)
Monocytes Absolute: 1.8 10*3/uL — ABNORMAL HIGH (ref 0.1–1.0)
Neutrophils Relative %: 43 % (ref 43–77)
Platelets: 215 10*3/uL (ref 150–400)

## 2012-12-01 LAB — BASIC METABOLIC PANEL
CO2: 25 mEq/L (ref 19–32)
Calcium: 9.8 mg/dL (ref 8.4–10.5)
Chloride: 110 mEq/L (ref 96–112)
Glucose, Bld: 104 mg/dL — ABNORMAL HIGH (ref 70–99)
Sodium: 143 mEq/L (ref 135–145)

## 2012-12-01 LAB — PRO B NATRIURETIC PEPTIDE: Pro B Natriuretic peptide (BNP): 502.6 pg/mL — ABNORMAL HIGH (ref 0–125)

## 2012-12-01 LAB — TROPONIN I: Troponin I: <0.3 ng/mL (ref <0.30)

## 2012-12-01 MED ORDER — HYDROCODONE-ACETAMINOPHEN 5-325 MG PO TABS: 1.0000 | ORAL_TABLET | ORAL | Status: DC | PRN

## 2012-12-01 MED ORDER — ENOXAPARIN SODIUM 120 MG/0.8ML ~~LOC~~ SOLN
1.0000 mg/kg | Freq: Once | SUBCUTANEOUS | Status: AC
  Administered 2012-12-01: 120 mg via SUBCUTANEOUS
  Filled 2012-12-01: qty 1.6

## 2012-12-01 MED ORDER — FUROSEMIDE 10 MG/ML IJ SOLN
60.0000 mg | Freq: Once | INTRAMUSCULAR | Status: AC
  Administered 2012-12-01: 60 mg via INTRAVENOUS
  Filled 2012-12-01: qty 6

## 2012-12-01 NOTE — Progress Notes (Signed)
Case manager consulted to speak with Patient Re medication needs.Role of case manager explained.Patient reports he has a pain medication prescription from [facility] ED that he wants to Start taking right now.I explained to patient that if he had a pain need he would need to inform his RN in the ED so his request could be addressed.I spoke with RN Rose Fillers re above.No further Case  Management needs identified.

## 2012-12-01 NOTE — ED Notes (Signed)
Received pt from vascular study with c/o swelling and pain to B/L legs. Pt was seen at [facility] yesterday and set up for doppler study today. Pt continues to have pain and reports the swelling is worse. Pt also reports feeling like his heart is out of beat.

## 2012-12-01 NOTE — Progress Notes (Signed)
CSW went to ED waiting area to give Pt taxi voucher and was told by ED Checkin Desk that Pt was given money by a staff member for the ride home and taxi voucher was no longer needed.   CSW called [employer] and cancelled taxi.    Pete Pelt, [facility].  [phone]

## 2012-12-01 NOTE — Progress Notes (Signed)
VASCULAR LAB PRELIMINARY  PRELIMINARY  PRELIMINARY  PRELIMINARY  Bilateral lower extremity venous duplex  completed.    Preliminary report:  Bilateral:  No evidence of DVT, superficial thrombosis, or Baker's Cyst.  Severe interstitial fluid noted in bilateral calves.    Zeva Leber, RVT 12/01/2012, 9:08 AM

## 2012-12-01 NOTE — Progress Notes (Signed)
CSW was contacted by CM concerning need for transportation home.   CSW met with Pt in the ED waiting area. Pt stated that he was told that by "ED Staff that a cab voucher would be provided when Pt comes to the ED". CSW explained to the Pt that taxi vouchers are a one time service for Pt's and that they were a last resort. Pt stated that he does "not want to be an inconvenience to his daughter" and that "you (ED) stated a ride home would be provided". CSW offered Pt a bus pass and Pt stated that he could not get on the bus due his inability to lift his legs. CSW again offered to contact Pt's daughter and Pt declined stating that she "is sick and goes to bed when she gets home from work."   CSW conferred with CSW supervisor and Pt will be provided a taxi ride home this one time.   CSW explained this to the Pt and will arrange for [facility] to pick Pt up at the ED Entrance.   Juan [name], [facility].  [phone]

## 2012-12-01 NOTE — ED Notes (Signed)
Pt undressed, in gown, on monitor, continuous pulse oximetry and blood pressure cuff 

## 2012-12-01 NOTE — ED Notes (Signed)
Ordered Low sodium lunch tray

## 2012-12-01 NOTE — ED Provider Notes (Signed)
History    CSN: [id number] Arrival date & time 12/01/12  0900  First MD Initiated Contact with Patient 12/01/12 [phone]     Chief Complaint  Patient presents with  . Leg Pain  . Leg Swelling   (Consider location/radiation/quality/duration/timing/severity/associated sxs/prior Treatment) HPI Pt with chronic lower ext swelling R>L thought to be due to venous insufficiency. Seen yesterday at [facility] ED and advised leg elevation, compression stockings and f/u with PMD/cardiology. Pt scheduled for venous doppler bl lower ext this AM. Pt had dopplers which demonstrated no DVT, +interstitial edema. Pt is belligerent about being admitted for IV abx to "dry up fluid.". States compression stocking hurt his heart and that he has been told in the past not to wear. Pt denies SOB, CP, fever chills.  Past Medical History  Diagnosis Date  . CAD (coronary artery disease)     dx elsewheer in past, no documentation. Non-ischemic myovue 2007  . CHF (congestive heart failure)     dx elsewhere, no documentation. Normal EF by previous echo. Trival AS needs f/u ECHO by 2012  . Diabetes mellitus   . Hypertension   . Sleep apnea, obstructive     at some point used CPAP, was d/c  years ago  . Anemia   . History of thrombocytosis   . Allergic rhinitis   . Edema     R>L leg, u/s 5-12 neg for DVT  . Hemorrhoid    Past Surgical History  Procedure Laterality Date  . Foot surgery      right toe surgery   Family History  Problem Relation Age of Onset  . Colon cancer Neg Hx   . Prostate cancer Neg Hx   . Heart attack Neg Hx   . Diabetes Neg Hx   . Schizophrenia Son    History  Substance Use Topics  . Smoking status: Former Smoker -- 10 years    Types: Cigarettes    Quit date: 05/18/1966  . Smokeless tobacco: Never Used  . Alcohol Use: No    Review of Systems  Constitutional: Negative for fever and chills.  Respiratory: Negative for cough and shortness of breath.   Cardiovascular: Positive for leg  swelling. Negative for chest pain.  Gastrointestinal: Negative for nausea, vomiting and abdominal pain.  Genitourinary: Negative for dysuria.  Musculoskeletal: Negative for myalgias and back pain.  Skin: Negative for rash and wound.  Neurological: Negative for dizziness, weakness, light-headedness and numbness.  All other systems reviewed and are negative.    Allergies  Review of patient's allergies indicates no known allergies.  Home Medications   Current Outpatient Rx  Name  Route  Sig  Dispense  Refill  . acetaminophen (TYLENOL) 325 MG tablet   Oral   Take 325-650 mg by mouth every 4 (four) hours as needed. For headache         . anagrelide (AGRYLIN) 0.5 MG capsule   Oral   Take 1 capsule (0.5 mg total) by mouth 2 (two) times daily.   30 capsule   0   . Ferrous Sulfate Dried 200 (65 FE) MG TABS   Oral   Take 1 capsule by mouth daily.          . fluticasone (FLONASE) 50 MCG/ACT nasal spray   Nasal   2 sprays by Nasal route daily.           . folic acid (FOLVITE) 1 MG tablet   Oral   Take 1 tablet (1 mg total) by mouth  daily.   30 tablet   3   . furosemide (LASIX) 40 MG tablet      TAKE 3 TABLETS BY MOUTH ONCE DAILY   90 tablet   6   . HYDROcodone-acetaminophen (NORCO/VICODIN) 5-325 MG per tablet   Oral   Take 1 tablet by mouth every 4 (four) hours as needed for pain.   15 tablet   0   . lisinopril (PRINIVIL,ZESTRIL) 20 MG tablet   Oral   Take 1 tablet (20 mg total) by mouth daily.   30 tablet   1   . lisinopril (PRINIVIL,ZESTRIL) 20 MG tablet      TAKE ONE TABLET BY MOUTH EVERY DAY   30 tablet   5   . potassium chloride SA (K-DUR,KLOR-CON) 20 MEQ tablet      TAKE 1 TABLET BY MOUTH DAILY   30 tablet   6    BP 141/66  Pulse 58  Temp(Src) 98.2 F (36.8 C) (Oral)  Resp 20  Ht 6\' 3"  (1.905 m)  Wt 286 lb (129.729 kg)  BMI 35.75 kg/m2  SpO2 100% Physical Exam  Nursing note and vitals reviewed. Constitutional: He is oriented to  person, place, and time. He appears well-developed and well-nourished. No distress.  HENT:  Head: Normocephalic and atraumatic.  Mouth/Throat: Oropharynx is clear and moist.  Eyes: EOM are normal. Pupils are equal, round, and reactive to light.  Neck: Normal range of motion. Neck supple.  Cardiovascular: Normal rate and regular rhythm.   Pulmonary/Chest: Effort normal and breath sounds normal. No respiratory distress. He has no wheezes. He has no rales.  Abdominal: Soft. Bowel sounds are normal. He exhibits no distension and no mass. There is no tenderness. There is no rebound and no guarding.  Musculoskeletal: Normal range of motion. He exhibits edema (2-3+ pitting edema bl lower ext, R>L. No obvious cellulitis. ) and tenderness (diffuse lower lext tenderness).  Neurological: He is alert and oriented to person, place, and time.  Skin: Skin is warm and dry. No rash noted. No erythema.  Psychiatric: He has a normal mood and affect. His behavior is normal.    ED Course  Procedures (including critical care time) Labs Reviewed - No data to display Dg Chest 2 View  11/30/2012   *RADIOLOGY REPORT*  Clinical Data: Leg swelling and chest pain.  CHEST - 2 VIEW  Comparison: 03/26/2012  Findings: Lungs are hypoinflated with mild prominence of the perihilar markings suggesting vascular congestion.  No focal consolidation or effusion.  Borderline cardiomegaly.  Continued evidence of subtle increased diffuse bone density.  IMPRESSION: Hypoinflation with findings suggesting mild vascular congestion.  Borderline cardiomegaly.  No change and mild diffuse increased bone density which may be due to metabolic versus neoplastic process.   Original Report Authenticated By: Marin Olp, M.D.   No diagnosis found.  MDM  Pt with full set of labs yesterday. Do not feel repeat draw is needed. Will treat with IV lasix. I have encouraged compression hose and elevation. No criteria for admission at this time. Pt needs to  f/u with his PMD for ongoing management of this chronic problem.   Pt diuresed well. Mild improvement of edema.   Julianne Rice, MD 12/01/12 [phone]

## 2012-12-23 ENCOUNTER — Ambulatory Visit (INDEPENDENT_AMBULATORY_CARE_PROVIDER_SITE_OTHER): Payer: Medicare Other | Admitting: Cardiology

## 2012-12-23 ENCOUNTER — Encounter: Payer: Self-pay | Admitting: Cardiology

## 2012-12-23 ENCOUNTER — Emergency Department (HOSPITAL_COMMUNITY)
Admission: EM | Admit: 2012-12-23 | Discharge: 2012-12-23 | Disposition: A | Discharge status: Home / Self Care | Payer: Medicare Other | Attending: Emergency Medicine | Admitting: Emergency Medicine

## 2012-12-23 ENCOUNTER — Encounter (HOSPITAL_COMMUNITY): Payer: Self-pay

## 2012-12-23 VITALS — BP 136/77 | HR 74 | Ht 75.0 in | Wt 258.8 lb

## 2012-12-23 DIAGNOSIS — E119 Type 2 diabetes mellitus without complications: Secondary | ICD-10-CM | POA: Insufficient documentation

## 2012-12-23 DIAGNOSIS — L02619 Cutaneous abscess of unspecified foot: Secondary | ICD-10-CM | POA: Insufficient documentation

## 2012-12-23 DIAGNOSIS — R600 Localized edema: Secondary | ICD-10-CM

## 2012-12-23 DIAGNOSIS — I509 Heart failure, unspecified: Secondary | ICD-10-CM | POA: Diagnosis not present

## 2012-12-23 DIAGNOSIS — IMO0002 Reserved for concepts with insufficient information to code with codable children: Secondary | ICD-10-CM | POA: Insufficient documentation

## 2012-12-23 DIAGNOSIS — Z9889 Other specified postprocedural states: Secondary | ICD-10-CM | POA: Insufficient documentation

## 2012-12-23 DIAGNOSIS — I10 Essential (primary) hypertension: Secondary | ICD-10-CM | POA: Insufficient documentation

## 2012-12-23 DIAGNOSIS — Z9981 Dependence on supplemental oxygen: Secondary | ICD-10-CM | POA: Insufficient documentation

## 2012-12-23 DIAGNOSIS — R011 Cardiac murmur, unspecified: Secondary | ICD-10-CM | POA: Insufficient documentation

## 2012-12-23 DIAGNOSIS — Z79899 Other long term (current) drug therapy: Secondary | ICD-10-CM | POA: Diagnosis not present

## 2012-12-23 DIAGNOSIS — D72829 Elevated white blood cell count, unspecified: Secondary | ICD-10-CM | POA: Insufficient documentation

## 2012-12-23 DIAGNOSIS — M79605 Pain in left leg: Secondary | ICD-10-CM

## 2012-12-23 DIAGNOSIS — R079 Chest pain, unspecified: Secondary | ICD-10-CM | POA: Diagnosis not present

## 2012-12-23 DIAGNOSIS — M7989 Other specified soft tissue disorders: Secondary | ICD-10-CM

## 2012-12-23 DIAGNOSIS — I2510 Atherosclerotic heart disease of native coronary artery without angina pectoris: Secondary | ICD-10-CM | POA: Insufficient documentation

## 2012-12-23 DIAGNOSIS — G4733 Obstructive sleep apnea (adult) (pediatric): Secondary | ICD-10-CM | POA: Diagnosis not present

## 2012-12-23 DIAGNOSIS — D649 Anemia, unspecified: Secondary | ICD-10-CM | POA: Insufficient documentation

## 2012-12-23 DIAGNOSIS — R51 Headache: Secondary | ICD-10-CM | POA: Diagnosis not present

## 2012-12-23 DIAGNOSIS — R609 Edema, unspecified: Secondary | ICD-10-CM | POA: Insufficient documentation

## 2012-12-23 DIAGNOSIS — I517 Cardiomegaly: Secondary | ICD-10-CM | POA: Diagnosis not present

## 2012-12-23 DIAGNOSIS — L03119 Cellulitis of unspecified part of limb: Secondary | ICD-10-CM | POA: Diagnosis not present

## 2012-12-23 DIAGNOSIS — Z8679 Personal history of other diseases of the circulatory system: Secondary | ICD-10-CM | POA: Insufficient documentation

## 2012-12-23 DIAGNOSIS — M79609 Pain in unspecified limb: Secondary | ICD-10-CM | POA: Diagnosis not present

## 2012-12-23 DIAGNOSIS — Z7902 Long term (current) use of antithrombotics/antiplatelets: Secondary | ICD-10-CM | POA: Diagnosis not present

## 2012-12-23 DIAGNOSIS — Z87891 Personal history of nicotine dependence: Secondary | ICD-10-CM | POA: Diagnosis not present

## 2012-12-23 DIAGNOSIS — M79604 Pain in right leg: Secondary | ICD-10-CM

## 2012-12-23 LAB — CBC
HCT: 32.1 % — ABNORMAL LOW (ref 39.0–52.0)
Hemoglobin: 10.3 g/dL — ABNORMAL LOW (ref 13.0–17.0)
MCHC: 32.1 g/dL (ref 30.0–36.0)
RBC: 3.96 MIL/uL — ABNORMAL LOW (ref 4.22–5.81)
WBC: 12.7 10*3/uL — ABNORMAL HIGH (ref 4.0–10.5)

## 2012-12-23 LAB — BASIC METABOLIC PANEL
BUN: 34 mg/dL — ABNORMAL HIGH (ref 6–23)
CO2: 27 mEq/L (ref 19–32)
Chloride: 105 mEq/L (ref 96–112)
Glucose, Bld: 96 mg/dL (ref 70–99)
Potassium: 4.3 mEq/L (ref 3.5–5.1)
Sodium: 141 mEq/L (ref 135–145)

## 2012-12-23 LAB — HM DIABETES EYE EXAM

## 2012-12-23 MED ORDER — SULFAMETHOXAZOLE-TMP DS 800-160 MG PO TABS: 1.0000 | ORAL_TABLET | Freq: Two times a day (BID) | ORAL | Status: DC

## 2012-12-23 NOTE — Patient Instructions (Addendum)
Your physician recommends that you continue on your current medications as directed. Please refer to the Current Medication list given to you today.  PLEASE GO TO EMERGENCY DEPARTMENT OR URGENT CARE CENTER FOR EVALUATION OF REFRACTOR LEG PAIN, RULE OUT CELLULITIS

## 2012-12-23 NOTE — ED Provider Notes (Signed)
I saw and evaluated the patient, reviewed the resident's note and I agree with the findings and plan.  Pt with erythema, swelling and warmth of bilateral lower extremities. He has been seen by cardiologist.  Also had venous duplex performed recently without evidence of DVT.  WBC mildly elevated.  Will start on antibiotics for a cellulitis.  Pt is overall nontoxic and well hydrated in appearance.  Discharged with strict return precautions.  Pt agreeable with plan.  Threasa Beards, MD 12/23/12 [phone]

## 2012-12-23 NOTE — Progress Notes (Signed)
ELECTROPHYSIOLOGY OFFICE NOTE  Patient ID: Adyen Miramon MRN: [id number], DOB/AGE: 09/05/1940   Date of Visit: 12/23/2012  Primary Physician: [name], MD Primary Cardiologist: Johnsie Cancel, MD Reason for Visit: Leg pain and swelling; referred by [facility] ED  History of Present Illness  Juan [name] is a 72 y.o. male     who presents today for hospital follow-up after being seen in ED on 12/01/2012 for leg pain and swelling. Since last being seen in our clinic, he reports he has had increasing leg pain and swelling. He describes constant "stinging" leg pain with increased swelling. He refuses to wear compression stockings stating they make his chest hurt. He has noticed chest "tightness" as well that occurs only with his leg pain. He denies exertional symptoms. He denies shortness of breath. He denies palpitations, dizziness, near syncope or syncope. He denies orthopnea, PND or recent weight gain.   Past Medical History Past Medical History  Diagnosis Date  . CAD (coronary artery disease)     dx elsewheer in past, no documentation. Non-ischemic myovue 2007  . CHF (congestive heart failure)     dx elsewhere, no documentation. Normal EF by previous echo. Trival AS needs f/u ECHO by 2012  . Diabetes mellitus   . Hypertension   . Sleep apnea, obstructive     at some point used CPAP, was d/c  years ago  . Anemia   . History of thrombocytosis   . Allergic rhinitis   . Edema     R>L leg, u/s 5-12 neg for DVT  . Hemorrhoid     Past Surgical History Past Surgical History  Procedure Laterality Date  . Foot surgery      right toe surgery    Allergies/Intolerances No Known Allergies  Current Home Medications Current Outpatient Prescriptions  Medication Sig Dispense Refill  . acetaminophen (TYLENOL) 325 MG tablet Take 325-650 mg by mouth every 4 (four) hours as needed. For headache      . anagrelide (AGRYLIN) 0.5 MG capsule Take 1 capsule (0.5 mg total) by mouth 2 (two) times daily.  30 capsule  0  .  Ferrous Sulfate Dried 200 (65 FE) MG TABS Take 1 capsule by mouth daily.       . fluticasone (FLONASE) 50 MCG/ACT nasal spray 2 sprays by Nasal route daily.        . folic acid (FOLVITE) 1 MG tablet Take 1 tablet (1 mg total) by mouth daily.  30 tablet  3  . furosemide (LASIX) 40 MG tablet TAKE 3 TABLETS BY MOUTH ONCE DAILY  90 tablet  6  . HYDROcodone-acetaminophen (NORCO/VICODIN) 5-325 MG per tablet Take 1 tablet by mouth every 4 (four) hours as needed for pain.  15 tablet  0  . lisinopril (PRINIVIL,ZESTRIL) 20 MG tablet Take 1 tablet (20 mg total) by mouth daily.  30 tablet  1  . lisinopril (PRINIVIL,ZESTRIL) 20 MG tablet TAKE ONE TABLET BY MOUTH EVERY DAY  30 tablet  5  . potassium chloride SA (K-DUR,KLOR-CON) 20 MEQ tablet TAKE 1 TABLET BY MOUTH DAILY  30 tablet  6   No current facility-administered medications for this visit.   Social History History   Social History  . Marital Status: Widowed    Spouse Name: N/A    Number of Children: 2  . Years of Education: N/A   Occupational History  . retired    .     Social History Main Topics  . Smoking status: Former Smoker -- 10 years  Types: Cigarettes    Quit date: 05/18/1966  . Smokeless tobacco: Never Used  . Alcohol Use: No  . Drug Use: No  . Sexually Active: Not on file   Other Topics Concern  . Not on file   Social History Narrative   Moved from [place] 2006   Widow 2007   Son lives with him/   Diet- trying to eat healthy   Exercise- very limited     Review of Systems General: No chills, fever, night sweats or weight changes Cardiovascular: No dyspnea on exertion, orthopnea, palpitations, paroxysmal nocturnal dyspnea Dermatological: No rash, lesions or masses Respiratory: No cough, dyspnea Urologic: No hematuria, dysuria Abdominal: No nausea, vomiting, diarrhea, bright red blood per rectum, melena, or hematemesis Neurologic: No visual changes, weakness, changes in mental status All other systems reviewed and are  otherwise negative except as noted above.  Physical Exam Vitals: Blood pressure 136/77, pulse 74, height 6\' 3"  (1.905 m), weight 258 lb 12.8 oz (117.391 kg).  General: Well developed, well appearing 72 y.o. male in no acute distress. HEENT: Normocephalic, atraumatic. EOMs intact. Sclera nonicteric. Oropharynx clear.  Neck: Supple. No JVD. Lungs: Respirations regular and unlabored, CTA bilaterally. No wheezes, rales or rhonchi. Heart: RRR. S1, S2 present. Soft II/VI systolic murmur.No rub, S3 or S4. Abdomen: Soft, non-distended.   Extremities: No clubbing or cyanosis. 2-3+ pedal edema bilaterally. Mild erythema but no warmth, drainage or tenderness to palpation. PT/Radials 2+ and equal bilaterally. Psych: Normal affect. Neuro: Alert and oriented X 3. Moves all extremities spontaneously.   Diagnostics 12-lead ECG today - NSR at 68 bpm with normal intervals and no ST-T wave abnormalities  Assessment and Plan 1. Leg pain and swelling  - with known severe venous insufficiency  - LE Dopplers negative for DVT on 12/01/2012  - Mr. Wawro refuses to wear compression stockings and states his leg pain has been constant and is unrelieved by hydrocodone  - repeatedly states he wants to go back to the ED stating his PCP will not see him; instructed him to see PCP or, if Dr. Larose Kells will not see him, to return to ED for refractory leg pain and swelling, possible cellulitis  - counseled regarding need for compression stockings and low sodium diet  - I spent 25 minutes with patient including review of medical records, counseling and arranging further care 2. Chest tightness  - atypical for angina; only occurs with leg pain and is non-exertional   - 12-lead ECG today shows NSR without ST-T wave abnormalities  - per Dr. Kyla Balzarine note, no documented history of CAD or HF 3. HTN  - stable; continue current regimen  This plan of care was formulated with Dr. Peter Martinique Signed, Nyemah Watton, PA-C 12/23/2012,  10:17 AM

## 2012-12-23 NOTE — ED Notes (Signed)
Pt. Was seen by his heart doctor today for pain in his legs.  Pt. Reports that his MD told him that his heart was okay, but to come to [place] for bilateral leg pain , r/o cellulitis.  Pt. Reports having leg pain on the 17th of July.   Both legs are swollen

## 2012-12-23 NOTE — ED Provider Notes (Signed)
CSN: [id number]     Arrival date & time 12/23/12  1041 History     First MD Initiated Contact with Patient 12/23/12 1111     Chief Complaint  Patient presents with  . Leg Pain   (Consider location/radiation/quality/duration/timing/severity/associated sxs/prior Treatment) Patient is a 72 y.o. male presenting with leg pain.  Leg Pain Associated symptoms: no back pain, no fatigue and no fever    Juan [name] is a 72 year old male with a PMH of CAD, CHF, Venous insufficiency. Patient was seen in the ED on 7/17 for lower extremity edema, U/S doppler at that time was neg for DVT.  Patient was instructed to follow up with PCP/Cardiologist.  Patient saw cardiologist this morning and reported swelling has gotten worse and has become painful.  He was sent by cardiology to the ED (if he could not get appointment with PCP) to rule out cellulitis.  Patient reports that he has had periodic leg swelling, and that it became painful once about 5-6 years ago and he was treated with IV antibiotics. He currently reports a dull pain in both of his legs that he rates as a 6/10, he reports that it was worse last night and that he has taken tylenol which has decreased the pain.  He reports compliance with his home medications.  Past Medical History  Diagnosis Date  . CAD (coronary artery disease)     dx elsewheer in past, no documentation. Non-ischemic myovue 2007  . CHF (congestive heart failure)     dx elsewhere, no documentation. Normal EF by previous echo. Trival AS needs f/u ECHO by 2012  . Diabetes mellitus   . Hypertension   . Sleep apnea, obstructive     at some point used CPAP, was d/c  years ago  . Anemia   . History of thrombocytosis   . Allergic rhinitis   . Edema     R>L leg, u/s 5-12 neg for DVT  . Hemorrhoid    Past Surgical History  Procedure Laterality Date  . Foot surgery      right toe surgery   Family History  Problem Relation Age of Onset  . Colon cancer Neg Hx   . Prostate cancer  Neg Hx   . Heart attack Neg Hx   . Diabetes Neg Hx   . Schizophrenia Son    History  Substance Use Topics  . Smoking status: Former Smoker -- 10 years    Types: Cigarettes    Quit date: 05/18/1966  . Smokeless tobacco: Never Used  . Alcohol Use: No    Review of Systems  Constitutional: Negative for fever, chills, diaphoresis and fatigue.  HENT: Negative for sore throat and tinnitus.   Eyes: Negative for visual disturbance.  Respiratory: Negative for cough, chest tightness and shortness of breath.   Cardiovascular: Positive for leg swelling. Negative for chest pain and palpitations.  Gastrointestinal: Negative for nausea, vomiting, abdominal pain, diarrhea and constipation.  Genitourinary: Negative for dysuria, frequency and difficulty urinating.  Musculoskeletal: Negative for back pain.  Skin: Negative for color change and rash.  Neurological: Positive for headaches. Negative for dizziness, weakness, light-headedness and numbness.  Psychiatric/Behavioral: Positive for sleep disturbance (trouble sleeping last night secondary to pain.).    Allergies  Review of patient's allergies indicates no known allergies.  Home Medications   Current Outpatient Rx  Name  Route  Sig  Dispense  Refill  . acetaminophen (TYLENOL) 500 MG tablet   Oral   Take 1,000 mg  by mouth every 6 (six) hours as needed for pain.         Marland Kitchen anagrelide (AGRYLIN) 0.5 MG capsule   Oral   Take 1 capsule (0.5 mg total) by mouth 2 (two) times daily.   30 capsule   0   . Ferrous Sulfate Dried 200 (65 FE) MG TABS   Oral   Take 1 capsule by mouth daily.          . fluticasone (FLONASE) 50 MCG/ACT nasal spray   Nasal   Place 2 sprays into the nose daily.          . folic acid (FOLVITE) 1 MG tablet   Oral   Take 1 tablet (1 mg total) by mouth daily.   30 tablet   3   . furosemide (LASIX) 40 MG tablet   Oral   Take 120 mg by mouth daily.         Marland Kitchen HYDROcodone-acetaminophen (NORCO/VICODIN) 5-325  MG per tablet   Oral   Take 1 tablet by mouth every 4 (four) hours as needed for pain.   15 tablet   0   . lisinopril (PRINIVIL,ZESTRIL) 20 MG tablet   Oral   Take 1 tablet (20 mg total) by mouth daily.   30 tablet   1   . potassium chloride SA (K-DUR,KLOR-CON) 20 MEQ tablet   Oral   Take 20 mEq by mouth daily.          BP 131/60  Pulse 58  Temp(Src) 97.8 F (36.6 C) (Oral)  Resp 20  SpO2 96% Physical Exam  Constitutional: He appears well-developed and well-nourished. No distress.  Cardiovascular: Normal rate and regular rhythm.   Murmur (SEM) heard. Pulmonary/Chest: Effort normal and breath sounds normal. No respiratory distress. He has no wheezes. He has no rales.  Abdominal: Soft. Bowel sounds are normal. He exhibits no distension. There is no tenderness.  Musculoskeletal: He exhibits edema (2-3+ edema to mid leg, mild warmth of area.).  Skin: Skin is warm and dry. He is not diaphoretic. No erythema.    ED Course   Procedures (including critical care time)  Labs Reviewed  CBC - Abnormal; Notable for the following:    WBC 12.7 (*)    RBC 3.96 (*)    Hemoglobin 10.3 (*)    HCT 32.1 (*)    RDW 19.5 (*)    All other components within normal limits  BASIC METABOLIC PANEL - Abnormal; Notable for the following:    BUN 34 (*)    GFR calc non Af Amer 60 (*)    GFR calc Af Amer 69 (*)    All other components within normal limits   No results found. 1. Cellulitis of foot   2. Edema leg     MDM  72 year old male with PMH of CAD, CHF, Mild AS, venous insufficiency, who presents today for swelling of his legs.  He has seen his cardiologist earlier today who sent him to the ED to rule out cellulitis. -Obtain CBC, BMet. 2pm: CBC shows mild leukocytosis of 12.7, anemia at baseline (taking iron), Bmet shows CKD no acute abnormalities. Will discharge home with prescription for Bactrim for 10 days.  Has appointment to follow up with PCP on the 28th.  Instructed to return  to ED if symptoms worsen.  Joni Reining, DO 12/23/12 1425

## 2013-01-12 ENCOUNTER — Encounter: Payer: Self-pay | Admitting: Nurse Practitioner

## 2013-01-12 ENCOUNTER — Ambulatory Visit: Payer: Medicare Other | Admitting: Internal Medicine

## 2013-01-12 ENCOUNTER — Telehealth: Payer: Self-pay | Admitting: Nurse Practitioner

## 2013-01-12 ENCOUNTER — Telehealth: Payer: Self-pay | Admitting: General Practice

## 2013-01-12 ENCOUNTER — Ambulatory Visit (INDEPENDENT_AMBULATORY_CARE_PROVIDER_SITE_OTHER): Payer: Medicare Other | Admitting: Nurse Practitioner

## 2013-01-12 VITALS — BP 118/70 | HR 77 | Temp 98.0°F | Wt 257.0 lb

## 2013-01-12 DIAGNOSIS — D649 Anemia, unspecified: Secondary | ICD-10-CM

## 2013-01-12 DIAGNOSIS — I10 Essential (primary) hypertension: Secondary | ICD-10-CM

## 2013-01-12 DIAGNOSIS — I872 Venous insufficiency (chronic) (peripheral): Secondary | ICD-10-CM

## 2013-01-12 DIAGNOSIS — I8310 Varicose veins of unspecified lower extremity with inflammation: Secondary | ICD-10-CM | POA: Diagnosis not present

## 2013-01-12 DIAGNOSIS — Z131 Encounter for screening for diabetes mellitus: Secondary | ICD-10-CM | POA: Diagnosis not present

## 2013-01-12 LAB — IRON AND TIBC
%SAT: 23 % (ref 20–55)
Iron: 60 ug/dL (ref 42–165)
TIBC: 266 ug/dL (ref 215–435)
UIBC: 206 ug/dL (ref 125–400)

## 2013-01-12 LAB — RENAL FUNCTION PANEL
Albumin: 4 g/dL (ref 3.5–5.2)
BUN: 37 mg/dL — ABNORMAL HIGH (ref 6–23)
Chloride: 104 mEq/L (ref 96–112)
GFR: 71.22 mL/min (ref >60.00)
Glucose, Bld: 89 mg/dL (ref 70–99)
Phosphorus: 3.6 mg/dL (ref 2.3–4.6)
Potassium: 4.8 mEq/L (ref 3.5–5.1)

## 2013-01-12 LAB — LIPID PANEL
HDL: 56.2 mg/dL (ref >39.00)
Total CHOL/HDL Ratio: 3
Triglycerides: 94 mg/dL (ref 0.0–149.0)

## 2013-01-12 LAB — HEPATIC FUNCTION PANEL
ALT: 16 U/L (ref 0–53)
Albumin: 4 g/dL (ref 3.5–5.2)
Bilirubin, Direct: 0 mg/dL (ref 0.0–0.3)
Total Protein: 7.4 g/dL (ref 6.0–8.3)

## 2013-01-12 LAB — IRON: Iron: 61 ug/dL (ref 42–165)

## 2013-01-12 MED ORDER — CYANOCOBALAMIN 1000 MCG/ML IJ SOLN: 1000.0000 ug | Freq: Once | INTRAMUSCULAR | Status: DC

## 2013-01-12 MED ORDER — B COMPLEX VITAMINS PO CAPS: 1.0000 | ORAL_CAPSULE | Freq: Every day | ORAL | Status: DC

## 2013-01-12 MED ORDER — HYDROCODONE-ACETAMINOPHEN 5-325 MG PO TABS: 1.0000 | ORAL_TABLET | ORAL | Status: DC | PRN

## 2013-01-12 MED ORDER — SULFAMETHOXAZOLE-TMP DS 800-160 MG PO TABS: 1.0000 | ORAL_TABLET | Freq: Two times a day (BID) | ORAL | Status: DC

## 2013-01-12 MED ORDER — CYANOCOBALAMIN 1000 MCG/ML IJ SOLN
1000.0000 ug | Freq: Once | INTRAMUSCULAR | Status: AC
  Administered 2013-01-12: 1000 ug via INTRAMUSCULAR

## 2013-01-12 MED ORDER — MUPIROCIN 2 % EX OINT: TOPICAL_OINTMENT | CUTANEOUS | Status: DC

## 2013-01-12 NOTE — Telephone Encounter (Signed)
Pt unable to afford mupirocin. Will prescribe 7 more days bactroban. Will prescribe b complex for ins coverage.

## 2013-01-12 NOTE — Patient Instructions (Addendum)
Our office will call you lab results. Start taking liquid vitamin B complex every day-1 dropper full in the morning and 1 at night. Hold in mouth for about 1 minute, then swallow. Start using mupriocin on scabs on legs 3 times daily for at least 7 days, then you may use bacitracin daily. Wash legs with mild soap like dove once daily. Do leg and ankle exercises every hour while you are sitting on couch (10 times, each hour). Take pain pills and tylenol as needed for leg pain. Avoid being outside during hottest part of day.  Take lasix earlier in afternoon, so your sleep is not interrupted with going to the bathroom. Pleasure to meet you.  Venous Stasis and Chronic Venous Insufficiency As people age, the veins located in their legs may weaken and stretch. When veins weaken and lose the ability to pump blood effectively, the condition is called chronic venous insufficiency (CVI) or venous stasis. Almost all veins return blood back to the heart. This happens by:  The force of the heart pumping fresh blood pushes blood back to the heart.  Blood flowing to the heart from the force of gravity. In the deep veins of the legs, blood has to fight gravity and flow upstream back to the heart. Here, the leg muscles contract to pump blood back toward the heart. Vein walls are elastic, and many veins have small valves that only allow blood to flow in one direction. When leg muscles contract, they push inward against the elastic vein walls. This squeezes blood upward, opens the valves, and moves blood toward the heart. When leg muscles relax, the vein wall also relaxes and the valves inside the vein close to prevent blood from flowing backward. This method of pumping blood out of the legs is called the venous pump. CAUSES  The venous pump works best while walking and leg muscles are contracting. But when a person sits or stands, blood pressure in leg veins can build. Deep veins are usually able to withstand short  periods of inactivity, but long periods of inactivity (and increased pressure) can stretch, weaken, and damage vein walls. High blood pressure can also stretch and damage vein walls. The veins may no longer be able to pump blood back to the heart. Venous hypertension (high blood pressure inside veins) that lasts over time is a primary cause of CVI. CVI can also be caused by:   Deep vein thrombosis, a condition where a thrombus (blood clot) blocks blood flow in a vein.  Phlebitis, an inflammation of a superficial vein that causes a blood clot to form. Other risk factors for CVI may include:   Heredity.  Obesity.  Pregnancy.  Sedentary lifestyle.  Smoking.  Jobs requiring long periods of standing or sitting in one place.  Age and gender:  Women in their 54's and 27's and men in their 60's are more prone to developing CVI. SYMPTOMS  Symptoms of CVI may include:   Varicose veins.  Ulceration or skin breakdown.  Lipodermatosclerosis, a condition that affects the skin just above the ankle, usually on the inside surface. Over time the skin becomes brown, smooth, tight and often painful. Those with this condition have a high risk of developing skin ulcers.  Reddened or discolored skin on the leg.  Swelling. DIAGNOSIS  Your caregiver can diagnose CVI after performing a careful medical history and physical examination. To confirm the diagnosis, the following tests may also be ordered:   Duplex ultrasound.  Plethysmography (tests blood flow).  Venograms (x-ray using a special dye). TREATMENT The goals of treatment for CVI are to restore a person to an active life and to minimize pain or disability. Typically, CVI does not pose a serious threat to life or limb, and with proper treatment most people with this condition can continue to lead active lives. In most cases, mild CVI can be treated on an outpatient basis with simple procedures. Treatment methods include:   Elastic  compression socks.  Sclerotherapy, a procedure involving an injection of a material that "dissolves" the damaged veins. Other veins in the network of blood vessels take over the function of the damaged veins.  Vein stripping (an older procedure less commonly used).  Laser Ablation surgery.  Valve repair. HOME CARE INSTRUCTIONS   Elastic compression socks must be worn every day. They can help with symptoms and lower the chances of the problem getting worse, but they do not cure the problem.  Only take over-the-counter or prescription medicines for pain, discomfort, or fever as directed by your caregiver.  Your caregiver will review your other medications with you. SEEK MEDICAL CARE IF:   You are confused about how to take your medications.  There is redness, swelling, or increasing pain in the affected area.  There is a red streak or line that extends up or down from the affected area.  There is a breakdown or loss of skin in the affected area, even if the breakdown is small.  You develop an unexplained oral temperature above 102 F (38.9 C).  There is an injury to the affected area. SEEK IMMEDIATE MEDICAL CARE IF:   There is an injury and open wound to the affected area.  Pain is not adequately relieved with pain medication prescribed or becomes severe.  An oral temperature above 102 F (38.9 C) develops.  The foot/ankle below the affected area becomes suddenly numb or the area feels weak and hard to move. MAKE SURE YOU:   Understand these instructions.  Will watch your condition.  Will get help right away if you are not doing well or get worse. Document Released: 09/07/2006 Document Revised: 07/27/2011 Document Reviewed: 11/15/2006 [facility] Patient Information 2014 [place], [place].

## 2013-01-12 NOTE — Telephone Encounter (Signed)
[facility] called stating that the pt pays for Rx's out of pocket. The mupirocin ointment (BACTROBAN) 2 % was $45 and is to expensive for the pt to fill. Would like to know if there is anything else that he could use that would be cheaper. Also pharmacist states that pt was advised to take vitamins at his appt but forgot what those were. Per office visit I see where this is Liquid vitamin B. Should this be OTC or an RX? Please call back pharmacy at [phone].

## 2013-01-12 NOTE — Progress Notes (Signed)
Subjective:     Juan [name] is a 72 y.o. male here for follow up of chronic conditions: HTN, anemia, & chronic venous insufficiency with chronic stasis. Re HTN: will controlled, ongoing LE edema r/t venous insufficiency. Re anemia: he is taking iron and folic acid supplements. Re venous insufficiency: he continues to have LE edema with dusky pigmentation, orange peel skin, occasional weeping, and pain. He was recently treated in ED for leg pain w/antibiotics & hydrocodone. Pt feels dermatitis has improved. 1T Hydrocodone and 1T tylenol provide complete pain relief. Pt continues to be convinced that compression stockings will make his situation worse.     The following portions of the patient's history were reviewed and updated as appropriate: allergies, current medications, past medical history, past social history and problem list.  Review of Systems Constitutional: negative for fatigue, fevers and night sweats Respiratory: negative for asthma, cough, dyspnea on exertion and sputum Cardiovascular: positive for lower extremity edema, negative for chest pain, chest pressure/discomfort, near-syncope and palpitations Gastrointestinal: negative for abdominal pain, change in bowel habits and nausea Genitourinary:positive for nocturia and takes 120 mg lasix before bedtime Integument/breast: positive for skin color change and skin lesion(s), negative for . Musculoskeletal:positive for walks with cane Endocrine: negative for diabetic symptoms including polydipsia, polyphagia and polyuria    Objective:    BP 118/70  Pulse 77  Temp(Src) 98 F (36.7 C) (Oral)  Wt 257 lb (116.574 kg)  BMI 32.12 kg/m2  SpO2 97% General appearance: alert, cooperative, appears stated age, mildly obese, slowed mentation and repeats instructions, repeats details of recalled conversation with other providers as in asperger's/autism. Exhibits OCD tendencies. Head: Normocephalic, without obvious abnormality,  atraumatic Eyes: conjunctivae/corneas clear. PERRL, EOM's intact. Fundi benign. Lungs: clear to auscultation bilaterally Heart: regular rate and rhythm, S1, S2 normal, no murmur, click, rub or gallop Extremities: edema +2 pitting to few inches below knees, bilaterally-worse on R, Homans sign is negative, no sign of DVT, venous stasis dermatitis noted and orange peel skin on RLE, small weeping area posterior RLE, dusky discoloration bilat, worse on R. Scars noted from previous ulcers RLE. 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 Compression stockings and aluminum acetate dressings recommended- pt declines. LE exercises demonstrated. Start mupirocin at weeping areas, if no improvement, recommend pneumonic compression at night (present as leg massage) or ref to wound center for eval & Tx.  2 well controlled on lisinopril 20 mg, 120 mg lasix 3 repeat b12, folate, and iron studies. B12 inj today. Rec oral B complex. 4 HgA1C  Today 5 Over 45 minutes spent with pt listening & explaining pathophys of his condition & rec. Treatment.

## 2013-01-12 NOTE — Telephone Encounter (Signed)
Rx sent into pharmacy. Patient notified of change.

## 2013-01-18 LAB — METHYLMALONIC ACID, SERUM: Methylmalonic Acid, Quant: 0.49 umol/L — ABNORMAL HIGH (ref <0.40)

## 2013-02-13 ENCOUNTER — Other Ambulatory Visit: Payer: Self-pay | Admitting: Internal Medicine

## 2013-02-13 DIAGNOSIS — I509 Heart failure, unspecified: Secondary | ICD-10-CM

## 2013-02-13 NOTE — Telephone Encounter (Signed)
Lasix and K-dur refills sent to [facility] on [address]

## 2013-03-18 ENCOUNTER — Other Ambulatory Visit: Payer: Self-pay | Admitting: Internal Medicine

## 2013-03-20 NOTE — Telephone Encounter (Signed)
Furosemide and potassium refills sent to pharmacy

## 2013-03-20 NOTE — Telephone Encounter (Signed)
Anagrelide refill sent to pharmacy

## 2013-03-21 ENCOUNTER — Other Ambulatory Visit: Payer: Self-pay | Admitting: *Deleted

## 2013-04-12 ENCOUNTER — Encounter: Payer: Self-pay | Admitting: Internal Medicine

## 2013-04-12 ENCOUNTER — Ambulatory Visit (INDEPENDENT_AMBULATORY_CARE_PROVIDER_SITE_OTHER): Payer: Medicare Other | Admitting: Internal Medicine

## 2013-04-12 VITALS — BP 136/80 | HR 74 | Temp 97.8°F | Wt 257.0 lb

## 2013-04-12 DIAGNOSIS — Z91199 Patient's noncompliance with other medical treatment and regimen due to unspecified reason: Secondary | ICD-10-CM

## 2013-04-12 DIAGNOSIS — I872 Venous insufficiency (chronic) (peripheral): Secondary | ICD-10-CM

## 2013-04-12 DIAGNOSIS — I10 Essential (primary) hypertension: Secondary | ICD-10-CM

## 2013-04-12 DIAGNOSIS — Z9119 Patient's noncompliance with other medical treatment and regimen: Secondary | ICD-10-CM

## 2013-04-12 DIAGNOSIS — E119 Type 2 diabetes mellitus without complications: Secondary | ICD-10-CM

## 2013-04-12 DIAGNOSIS — Z23 Encounter for immunization: Secondary | ICD-10-CM

## 2013-04-12 DIAGNOSIS — R609 Edema, unspecified: Secondary | ICD-10-CM

## 2013-04-12 DIAGNOSIS — D649 Anemia, unspecified: Secondary | ICD-10-CM

## 2013-04-12 MED ORDER — HYDROCODONE-ACETAMINOPHEN 5-325 MG PO TABS: 1.0000 | ORAL_TABLET | ORAL | Status: DC | PRN

## 2013-04-12 NOTE — Patient Instructions (Signed)
Elevate your legs one hour midmorning and one hour at midafternoon, try to avoid salt Use an antibiotic ointment to that area at the  left leg.   Next visit in 2 months for a   follow up (30 minutes) No Fasting Please make an appointment

## 2013-04-12 NOTE — Assessment & Plan Note (Addendum)
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 i agree that the patient may have limited abilities to make decisions consequently -with the patient's permission- I talked with his son, make him aware of anemia, the need for further eval and  risk of cancer.

## 2013-04-12 NOTE — Assessment & Plan Note (Signed)
Seems stable

## 2013-04-12 NOTE — Assessment & Plan Note (Addendum)
I again discussed this issue with the patient,recommended a colonoscopy, he still tells me he's not ready.

## 2013-04-12 NOTE — Assessment & Plan Note (Signed)
Well controlled 

## 2013-04-12 NOTE — Progress Notes (Signed)
Pre visit review using our clinic review tool, if applicable. No additional management support is needed unless otherwise documented below in the visit note. 

## 2013-04-12 NOTE — Assessment & Plan Note (Addendum)
Chronic lower extremity edema felt to be related to venous insufficiency, seen at the ER 8 - 2014 for cellulitis, status post Bactrim. Currently with no evidence of acute infection. Management limited by noncompliance with compression stockings. Plan: Refer to interventional radiology to document venous insufficiency and see if treatment is possible. Leg elevation, low salt diet, Juan [name] is already taking Lasix 120 mg daily. Schedule a visit 2 months from now. Addendum:  --Juan [name] has a 3 mm excoriation in the left pretibial area without redness or discharge,  recommend OTC antibiotic cream --Also requests hydrocodone for as needed use, rx provided

## 2013-04-12 NOTE — Progress Notes (Signed)
  Subjective:    Patient ID: Juan [name], male    DOB: [birth year], 72 y.o.   MRN: [id number]  HPI Routine followup Since the last time he was here, he has been at the ER twice, has seen cardiology and our primary care nurse practitioner, notes and labs reviewed. In general feels well, still have some edema and mild LE pain, request pain medication. Hypertension, good medication compliance, no ambulatory BPs however per chart review BP has been okay. History of diabetes, reports he check his blood sugar from time to time and is "okay" but could not tell me any readings.  Past Medical History  Diagnosis Date  . CAD (coronary artery disease)     dx elsewheer in past, no documentation. Non-ischemic myovue 2007  . CHF (congestive heart failure)     dx elsewhere, no documentation. Normal EF by previous echo. Trival AS needs f/u ECHO by 2012  . Diabetes mellitus   . Hypertension   . Sleep apnea, obstructive     at some point used CPAP, was d/c  years ago  . Anemia   . History of thrombocytosis   . Allergic rhinitis   . Edema     R>L leg, u/s 5-12 neg for DVT  . Hemorrhoid    Past Surgical History  Procedure Laterality Date  . Foot surgery      right toe surgery     Review of Systems No chest pain recently, occasionally sob which is at baseline. No nausea vomiting, occasional diarrhea.     Objective:   Physical Exam BP 136/80  Pulse 74  Temp(Src) 97.8 F (36.6 C)  Wt 257 lb (116.574 kg)  SpO2 97% General -- alert, well-developed, NAD.  Lungs -- normal respiratory effort, no intercostal retractions, no accessory muscle use, and normal breath sounds.  Heart-- normal rate, regular rhythm, no murmur.   Extremities--  Chronic skin changes, hard edema from mid pretibial area down bilaterally, no weeping, redness, warmness.  Neurologic--  alert & oriented X3.   Psych--  Cooperative with normal attention span and concentration. No anxious appearing , no depressed appearing.       Assessment & Plan:  Today , I spent more than 35  min with the patient, >50% of the time counseling, and reviewing the chart and labs ordered by other providers

## 2013-04-14 ENCOUNTER — Other Ambulatory Visit: Payer: Self-pay | Admitting: Internal Medicine

## 2013-04-14 NOTE — Telephone Encounter (Signed)
Furosemide refilled per protocol

## 2013-04-20 ENCOUNTER — Telehealth: Payer: Self-pay | Admitting: *Deleted

## 2013-04-20 MED ORDER — MUPIROCIN CALCIUM 2 % EX CREA: 1.0000 "application " | TOPICAL_CREAM | Freq: Two times a day (BID) | CUTANEOUS | Status: DC

## 2013-04-20 NOTE — Telephone Encounter (Signed)
Patient is calling requesting a prescription for an antibiotic cream from his 11/26 visit. Patient is addiment that he wants a prescribed cream and nothing OTC. Please advise.

## 2013-04-20 NOTE — Telephone Encounter (Signed)
Prescription sent, apply twice a day for 10 days

## 2013-04-27 ENCOUNTER — Ambulatory Visit: Payer: Medicare Other | Admitting: Internal Medicine

## 2013-04-28 ENCOUNTER — Ambulatory Visit (INDEPENDENT_AMBULATORY_CARE_PROVIDER_SITE_OTHER): Payer: Medicare Other | Admitting: Cardiovascular Disease

## 2013-04-28 ENCOUNTER — Encounter: Payer: Self-pay | Admitting: Cardiovascular Disease

## 2013-04-28 VITALS — BP 132/82 | HR 64 | Ht 75.0 in | Wt 263.0 lb

## 2013-04-28 DIAGNOSIS — I517 Cardiomegaly: Secondary | ICD-10-CM

## 2013-04-28 DIAGNOSIS — R609 Edema, unspecified: Secondary | ICD-10-CM

## 2013-04-28 DIAGNOSIS — Z79899 Other long term (current) drug therapy: Secondary | ICD-10-CM

## 2013-04-28 MED ORDER — FUROSEMIDE 40 MG PO TABS: 40.0000 mg | ORAL_TABLET | ORAL | Status: DC

## 2013-04-28 NOTE — Assessment & Plan Note (Signed)
Well controlled.  Continue current medications and low sodium Dash type diet.    

## 2013-04-28 NOTE — Assessment & Plan Note (Signed)
Increase lasix back to 80 bid  Ok to run a bit prerenal to decrease risk of skin break down and infection BMET in 4 weeks

## 2013-04-28 NOTE — Addendum Note (Signed)
Addended by: Peggyann Shoals on: 04/28/2013 09:53 AM   Modules accepted: Orders

## 2013-04-28 NOTE — Progress Notes (Signed)
Patient ID: Juan [name], male   DOB: 06/26/1940, 72 y.o.   MRN: [id number] [name] is seen today for F/U of HTN, and edema. He carries a Dx of CAD and CHF. There has never been any documentation of this. He indicated these problems when he moved here from [place]> He had a normal myovue and echo in 2007. He is a poor historian but denies PND, orthopnea and his edema is improved. He has mild chronic fatigue and exertional dyspnea. He denies cough or sputum. He has been compliant with his meds. His son lives with him and has some psycholigical issues. This is a little stressful for him  Echo 1/11 showed normal EF 55-60%  ECG reviewed today NSR 70 Normal  Compliant with meds no new heart issues Has anemia being seen by Juan [name]  Been in ER with LE edema stomach upset and leg pains       ROS: Denies fever, malais, weight loss, blurry vision, decreased visual acuity, cough, sputum, SOB, hemoptysis, pleuritic pain, palpitaitons, heartburn, abdominal pain, melena, lower extremity edema, claudication, or rash.  All other systems reviewed and negative  General: Affect appropriate Obese black male  HEENT: normal Neck supple with no adenopathy JVP normal no bruits no thyromegaly Lungs clear with no wheezing and good diaphragmatic motion Heart:  S1/S2 no murmur, no rub, gallop or click PMI normal Abdomen: benighn, BS positve, no tenderness, no AAA no bruit.  No HSM or HJR Distal pulses intact with no bruits Plus 2 bilateral  edema Neuro non-focal Skin warm and dry No muscular weakness   Current Outpatient Prescriptions  Medication Sig Dispense Refill  . acetaminophen (TYLENOL) 500 MG tablet Take 1,000 mg by mouth every 6 (six) hours as needed for pain.      Marland Kitchen anagrelide (AGRYLIN) 0.5 MG capsule Take 1 capsule (0.5 mg total) by mouth 2 (two) times daily.  30 capsule  0  . b complex vitamins capsule Take 1 capsule by mouth daily.  30 capsule  3  . Ferrous Sulfate Dried 200 (65 FE) MG TABS Take 1 capsule by  mouth daily.       . fluticasone (FLONASE) 50 MCG/ACT nasal spray Place 2 sprays into the nose daily.       . folic acid (FOLVITE) 1 MG tablet Take 1 tablet (1 mg total) by mouth daily.  30 tablet  3  . furosemide (LASIX) 40 MG tablet TAKE 3 TABLETS BY MOUTH EVERY DAY  90 tablet  1  . HYDROcodone-acetaminophen (NORCO/VICODIN) 5-325 MG per tablet Take 1 tablet by mouth every 4 (four) hours as needed.  20 tablet  0  . lisinopril (PRINIVIL,ZESTRIL) 20 MG tablet Take 1 tablet (20 mg total) by mouth daily.  30 tablet  1  . mupirocin cream (BACTROBAN) 2 % Apply 1 application topically 2 (two) times daily.  15 g  0  . potassium chloride SA (K-DUR,KLOR-CON) 20 MEQ tablet Take 20 mEq by mouth daily.       No current facility-administered medications for this visit.    Allergies  Review of patient's allergies indicates no known allergies.  Electrocardiogram:  12/23/12  SR rate 68 LVH PAC   Assessment and Plan

## 2013-04-28 NOTE — Assessment & Plan Note (Signed)
Discussed low carb diet.  Target hemoglobin A1c is 6.5 or less.  Continue current medications.  

## 2013-04-28 NOTE — Patient Instructions (Signed)
Your physician has requested that you have an echocardiogram. Echocardiography is a painless test that uses sound waves to create images of your heart. It provides your doctor with information about the size and shape of your heart and how well your heart's chambers and valves are working. This procedure takes approximately one hour. There are no restrictions for this procedure. IN  2  WEEKS  Your physician has recommended you make the following change in your medication:  INCREASE  FUROSEMIDE  TO  [place]   Your physician recommends that you return for lab work in: BMET IN [place] wants you to follow-up in:   [place] will receive a reminder letter in the mail two months in advance. If you don't receive a letter, please call our office to schedule the follow-up appointment.

## 2013-04-28 NOTE — Assessment & Plan Note (Signed)
Stable with no angina and good activity level.  Continue medical Rx  

## 2013-04-28 NOTE — Assessment & Plan Note (Signed)
Dyspnea and fatigue seem funcitonal with edema will check echo for RV and LV function

## 2013-05-17 ENCOUNTER — Other Ambulatory Visit: Payer: Medicare Other

## 2013-05-17 ENCOUNTER — Other Ambulatory Visit: Payer: Self-pay | Admitting: Internal Medicine

## 2013-05-19 ENCOUNTER — Encounter: Payer: Self-pay | Admitting: Cardiology

## 2013-05-19 ENCOUNTER — Other Ambulatory Visit: Payer: Self-pay | Admitting: Internal Medicine

## 2013-05-19 ENCOUNTER — Ambulatory Visit (HOSPITAL_COMMUNITY): Payer: Medicare Other | Attending: Cardiology | Admitting: Cardiology

## 2013-05-19 DIAGNOSIS — I509 Heart failure, unspecified: Secondary | ICD-10-CM | POA: Insufficient documentation

## 2013-05-19 DIAGNOSIS — I059 Rheumatic mitral valve disease, unspecified: Secondary | ICD-10-CM | POA: Insufficient documentation

## 2013-05-19 DIAGNOSIS — R0609 Other forms of dyspnea: Secondary | ICD-10-CM | POA: Insufficient documentation

## 2013-05-19 DIAGNOSIS — I517 Cardiomegaly: Secondary | ICD-10-CM | POA: Diagnosis not present

## 2013-05-19 DIAGNOSIS — I079 Rheumatic tricuspid valve disease, unspecified: Secondary | ICD-10-CM | POA: Insufficient documentation

## 2013-05-19 DIAGNOSIS — R0989 Other specified symptoms and signs involving the circulatory and respiratory systems: Secondary | ICD-10-CM | POA: Insufficient documentation

## 2013-05-19 DIAGNOSIS — I2510 Atherosclerotic heart disease of native coronary artery without angina pectoris: Secondary | ICD-10-CM | POA: Insufficient documentation

## 2013-05-19 NOTE — Telephone Encounter (Signed)
rx refiled per protocol DJR

## 2013-05-19 NOTE — Progress Notes (Signed)
Echo performed. 

## 2013-05-19 NOTE — Telephone Encounter (Signed)
Potassium Chloride refilled per protocol. JG//CMA

## 2013-06-01 ENCOUNTER — Ambulatory Visit
Admission: RE | Admit: 2013-06-01 | Discharge: 2013-06-01 | Disposition: A | Discharge status: Home / Self Care | Payer: Medicare Other | Source: Ambulatory Visit | Attending: Internal Medicine | Admitting: Internal Medicine

## 2013-06-01 DIAGNOSIS — R609 Edema, unspecified: Secondary | ICD-10-CM

## 2013-06-01 DIAGNOSIS — M7989 Other specified soft tissue disorders: Secondary | ICD-10-CM | POA: Diagnosis not present

## 2013-06-06 ENCOUNTER — Telehealth: Payer: Self-pay | Admitting: Internal Medicine

## 2013-06-06 NOTE — Telephone Encounter (Signed)
Please advise      KP 

## 2013-06-06 NOTE — Telephone Encounter (Signed)
[facility] pharmacy is calling and states that the patient's anagrelide (AGRYLIN) 0.5 MG capsule is on backorder and the manufacturer does not know when they will have more. Pharmacy wants to know if Dr. Larose Kells wants it to be switched to something else. Please advise.

## 2013-06-06 NOTE — Telephone Encounter (Signed)
The pharmacy and the patient has been made aware. The patient has voiced understanding and agreed to take the 81 mg of ASA.     KP

## 2013-06-06 NOTE — Telephone Encounter (Signed)
On Agrylin d/t  essential thrombocytosis, recommend to take aspirin 81 mg daily for now

## 2013-06-15 ENCOUNTER — Ambulatory Visit (INDEPENDENT_AMBULATORY_CARE_PROVIDER_SITE_OTHER): Payer: Medicare Other | Admitting: Internal Medicine

## 2013-06-15 ENCOUNTER — Encounter: Payer: Self-pay | Admitting: Lab

## 2013-06-15 ENCOUNTER — Encounter: Payer: Self-pay | Admitting: Internal Medicine

## 2013-06-15 VITALS — BP 134/69 | HR 71 | Temp 97.9°F | Wt 269.0 lb

## 2013-06-15 DIAGNOSIS — R7309 Other abnormal glucose: Secondary | ICD-10-CM

## 2013-06-15 DIAGNOSIS — I10 Essential (primary) hypertension: Secondary | ICD-10-CM | POA: Diagnosis not present

## 2013-06-15 DIAGNOSIS — R609 Edema, unspecified: Secondary | ICD-10-CM

## 2013-06-15 DIAGNOSIS — D759 Disease of blood and blood-forming organs, unspecified: Secondary | ICD-10-CM | POA: Diagnosis not present

## 2013-06-15 DIAGNOSIS — R7303 Prediabetes: Secondary | ICD-10-CM

## 2013-06-15 LAB — BASIC METABOLIC PANEL
BUN: 44 mg/dL — ABNORMAL HIGH (ref 6–23)
CO2: 26 mEq/L (ref 19–32)
Calcium: 9.6 mg/dL (ref 8.4–10.5)
Chloride: 105 mEq/L (ref 96–112)
Creatinine, Ser: 1.3 mg/dL (ref 0.4–1.5)
GFR: 70.5 mL/min (ref >60.00)
Glucose, Bld: 86 mg/dL (ref 70–99)
POTASSIUM: 4.4 meq/L (ref 3.5–5.1)
SODIUM: 138 meq/L (ref 135–145)

## 2013-06-15 NOTE — Assessment & Plan Note (Addendum)
Patient was seen by radiology, no venous insufficiency. Last echocardiogram showed normal EF w/ slightly increase in the PA pressure. Cardiology recommended continuing diuretics. Leg elevation recommended. Also request the "800 mg pain pill", I don't know what he is referring to (?Motrin). rec to contact his pharmacy and ask for a RF, will see if a RF is appropriate

## 2013-06-15 NOTE — Patient Instructions (Signed)
Get your blood work before you leave    Next visit is for a physical exam in 4-5 months ,  fasting Please make an appointment

## 2013-06-15 NOTE — Assessment & Plan Note (Signed)
Well-controlled,  check a BMP 

## 2013-06-15 NOTE — Assessment & Plan Note (Signed)
Aggrilyn not available at this time, on aspirin

## 2013-06-15 NOTE — Assessment & Plan Note (Signed)
A1c has been stable over time

## 2013-06-15 NOTE — Progress Notes (Signed)
Pre visit review using our clinic review tool, if applicable. No additional management support is needed unless otherwise documented below in the visit note. 

## 2013-06-15 NOTE — Progress Notes (Signed)
   Subjective:    Patient ID: Juan [name], male    DOB: 1940/10/05, 73 y.o.   MRN: [id number]  HPI Routine office visit Since the last time he was here, saw  radiology and cardiology, echocardiogram showed a normal EF, ultrasound show no  venous insufficiency. He is not taking aggrilyn  which is not available at the pharmacy. On aspirin.   Past Medical History  Diagnosis Date  . CAD (coronary artery disease)     dx elsewheer in past, no documentation. Non-ischemic myovue 2007  . CHF (congestive heart failure)     dx elsewhere, no documentation. Normal EF by previous echo. Trival AS needs f/u ECHO by 2012  . Diabetes mellitus   . Hypertension   . Sleep apnea, obstructive     at some point used CPAP, was d/c  years ago  . Anemia   . History of thrombocytosis   . Allergic rhinitis   . Edema     R>L leg, u/s 5-12 neg for DVT  . Hemorrhoid    Past Surgical History  Procedure Laterality Date  . Foot surgery      right toe surgery   History   Social History  . Marital Status: Widowed    Spouse Name: N/A    Number of Children: 2  . Years of Education: N/A   Occupational History  . retired    .     Social History Main Topics  . Smoking status: Former Smoker -- 10 years    Types: Cigarettes    Quit date: 05/18/1966  . Smokeless tobacco: Never Used  . Alcohol Use: No  . Drug Use: No  . Sexual Activity: Not on file   Other Topics Concern  . Not on file   Social History Narrative   Moved from [place] 2006   Widow 2007   Son lives with him     Review of Systems No new complaints, still feeling tired. Still has leg pain but less than before. Denies any nausea or vomiting. Occasional diarrhea without blood in the stools.     Objective:   Physical Exam  BP 134/69  Pulse 71  Temp(Src) 97.9 F (36.6 C)  Wt 269 lb (122.018 kg)  SpO2 94% General -- alert, well-developed, NAD.  Lungs -- normal respiratory effort, no intercostal retractions, no accessory muscle use, and  normal breath sounds.  Heart-- normal rate, regular rhythm, trace syst  Murmur?Marland Kitchen  Extremities--  Chronic skin changes, hard edema from mid pretibial area down bilaterally, no weeping today, no  Redness - warmness. Neurologic--  alert & oriented X3.   Psych--  No anxious or depressed appearing.      Assessment & Plan:

## 2013-06-16 ENCOUNTER — Other Ambulatory Visit: Payer: Self-pay | Admitting: Internal Medicine

## 2013-06-16 ENCOUNTER — Encounter: Payer: Self-pay | Admitting: *Deleted

## 2013-06-16 DIAGNOSIS — Z79899 Other long term (current) drug therapy: Secondary | ICD-10-CM | POA: Diagnosis not present

## 2013-06-19 NOTE — Telephone Encounter (Signed)
Lisinopril refilled per protocol. JG//CMA 

## 2013-06-19 NOTE — Telephone Encounter (Signed)
Furosemide refilled per protocol. JG//CMA

## 2013-06-20 ENCOUNTER — Other Ambulatory Visit: Payer: Self-pay | Admitting: *Deleted

## 2013-06-27 ENCOUNTER — Telehealth: Payer: Self-pay | Admitting: *Deleted

## 2013-06-27 NOTE — Telephone Encounter (Signed)
He is not on Bactrim to  my knowledge,   that is an  antibiotic. He may be referring to bactroban an antibiotic ointment; he really does not need to use that every day, okay to use a antibiotic ointment OTC

## 2013-06-27 NOTE — Telephone Encounter (Signed)
Pt requesting bactrim, pt states called pharmacy but they are currently on back order until march

## 2013-06-29 NOTE — Telephone Encounter (Signed)
Pt referring to bactrim the antibiotic not the ointment.

## 2013-06-30 ENCOUNTER — Telehealth: Payer: Self-pay

## 2013-06-30 NOTE — Telephone Encounter (Signed)
UDS: 06/16/2013 Negative for Hydrocodone (PRN med) Low risk per Dr Larose Kells

## 2013-07-11 ENCOUNTER — Inpatient Hospital Stay (HOSPITAL_COMMUNITY)
Admission: EM | Admit: 2013-07-11 | Discharge: 2013-07-14 | DRG: 603 | Disposition: A | Discharge status: Home Health | Payer: Medicare Other | Attending: Internal Medicine | Admitting: Internal Medicine

## 2013-07-11 ENCOUNTER — Encounter (HOSPITAL_COMMUNITY): Payer: Self-pay | Admitting: Emergency Medicine

## 2013-07-11 DIAGNOSIS — J309 Allergic rhinitis, unspecified: Secondary | ICD-10-CM

## 2013-07-11 DIAGNOSIS — Z9119 Patient's noncompliance with other medical treatment and regimen: Secondary | ICD-10-CM

## 2013-07-11 DIAGNOSIS — R7303 Prediabetes: Secondary | ICD-10-CM

## 2013-07-11 DIAGNOSIS — E119 Type 2 diabetes mellitus without complications: Secondary | ICD-10-CM | POA: Diagnosis present

## 2013-07-11 DIAGNOSIS — D720 Genetic anomalies of leukocytes: Secondary | ICD-10-CM | POA: Diagnosis not present

## 2013-07-11 DIAGNOSIS — Z87891 Personal history of nicotine dependence: Secondary | ICD-10-CM | POA: Diagnosis present

## 2013-07-11 DIAGNOSIS — I129 Hypertensive chronic kidney disease with stage 1 through stage 4 chronic kidney disease, or unspecified chronic kidney disease: Secondary | ICD-10-CM | POA: Diagnosis present

## 2013-07-11 DIAGNOSIS — N179 Acute kidney failure, unspecified: Secondary | ICD-10-CM | POA: Diagnosis present

## 2013-07-11 DIAGNOSIS — D649 Anemia, unspecified: Secondary | ICD-10-CM | POA: Diagnosis present

## 2013-07-11 DIAGNOSIS — R634 Abnormal weight loss: Secondary | ICD-10-CM

## 2013-07-11 DIAGNOSIS — L0390 Cellulitis, unspecified: Secondary | ICD-10-CM | POA: Diagnosis not present

## 2013-07-11 DIAGNOSIS — D473 Essential (hemorrhagic) thrombocythemia: Secondary | ICD-10-CM | POA: Diagnosis present

## 2013-07-11 DIAGNOSIS — Z7982 Long term (current) use of aspirin: Secondary | ICD-10-CM

## 2013-07-11 DIAGNOSIS — I2510 Atherosclerotic heart disease of native coronary artery without angina pectoris: Secondary | ICD-10-CM | POA: Diagnosis present

## 2013-07-11 DIAGNOSIS — L0291 Cutaneous abscess, unspecified: Secondary | ICD-10-CM | POA: Diagnosis not present

## 2013-07-11 DIAGNOSIS — L02419 Cutaneous abscess of limb, unspecified: Principal | ICD-10-CM | POA: Diagnosis present

## 2013-07-11 DIAGNOSIS — R609 Edema, unspecified: Secondary | ICD-10-CM

## 2013-07-11 DIAGNOSIS — I509 Heart failure, unspecified: Secondary | ICD-10-CM | POA: Diagnosis present

## 2013-07-11 DIAGNOSIS — N400 Enlarged prostate without lower urinary tract symptoms: Secondary | ICD-10-CM

## 2013-07-11 DIAGNOSIS — G4733 Obstructive sleep apnea (adult) (pediatric): Secondary | ICD-10-CM

## 2013-07-11 DIAGNOSIS — L03119 Cellulitis of unspecified part of limb: Secondary | ICD-10-CM | POA: Diagnosis present

## 2013-07-11 DIAGNOSIS — Z91199 Patient's noncompliance with other medical treatment and regimen due to unspecified reason: Secondary | ICD-10-CM

## 2013-07-11 DIAGNOSIS — I10 Essential (primary) hypertension: Secondary | ICD-10-CM

## 2013-07-11 DIAGNOSIS — D759 Disease of blood and blood-forming organs, unspecified: Secondary | ICD-10-CM

## 2013-07-11 DIAGNOSIS — N189 Chronic kidney disease, unspecified: Secondary | ICD-10-CM | POA: Diagnosis present

## 2013-07-11 DIAGNOSIS — I872 Venous insufficiency (chronic) (peripheral): Secondary | ICD-10-CM | POA: Diagnosis present

## 2013-07-11 DIAGNOSIS — I517 Cardiomegaly: Secondary | ICD-10-CM

## 2013-07-11 DIAGNOSIS — I5032 Chronic diastolic (congestive) heart failure: Secondary | ICD-10-CM | POA: Diagnosis present

## 2013-07-11 HISTORY — DX: Migraine, unspecified, not intractable, without status migrainosus: G43.909

## 2013-07-11 HISTORY — DX: Nasal congestion: R09.81

## 2013-07-11 HISTORY — DX: Type 2 diabetes mellitus without complications: E11.9

## 2013-07-11 LAB — BASIC METABOLIC PANEL
BUN: 31 mg/dL — AB (ref 6–23)
CO2: 23 meq/L (ref 19–32)
Calcium: 10.1 mg/dL (ref 8.4–10.5)
Chloride: 100 mEq/L (ref 96–112)
Creatinine, Ser: 1.56 mg/dL — ABNORMAL HIGH (ref 0.50–1.35)
GFR calc Af Amer: 50 mL/min — ABNORMAL LOW (ref >90)
GFR calc non Af Amer: 43 mL/min — ABNORMAL LOW (ref >90)
Glucose, Bld: 99 mg/dL (ref 70–99)
Potassium: 4.5 mEq/L (ref 3.7–5.3)
SODIUM: 139 meq/L (ref 137–147)

## 2013-07-11 LAB — CBC WITH DIFFERENTIAL/PLATELET
BAND NEUTROPHILS: 0 % (ref 0–10)
BASOS ABS: 1.1 10*3/uL — AB (ref 0.0–0.1)
BASOS PCT: 6 % — AB (ref 0–1)
Blasts: 0 %
EOS PCT: 3 % (ref 0–5)
Eosinophils Absolute: 0.5 10*3/uL (ref 0.0–0.7)
HEMATOCRIT: 33.7 % — AB (ref 39.0–52.0)
HEMOGLOBIN: 10.6 g/dL — AB (ref 13.0–17.0)
LYMPHS PCT: 24 % (ref 12–46)
Lymphs Abs: 4.4 10*3/uL — ABNORMAL HIGH (ref 0.7–4.0)
MCH: 25.4 pg — ABNORMAL LOW (ref 26.0–34.0)
MCHC: 31.5 g/dL (ref 30.0–36.0)
MCV: 80.8 fL (ref 78.0–100.0)
MONO ABS: 0.9 10*3/uL (ref 0.1–1.0)
MONOS PCT: 5 % (ref 3–12)
Metamyelocytes Relative: 0 %
Myelocytes: 0 %
NEUTROS ABS: 11.3 10*3/uL — AB (ref 1.7–7.7)
Neutrophils Relative %: 62 % (ref 43–77)
PROMYELOCYTES ABS: 0 %
Platelets: 327 10*3/uL (ref 150–400)
RBC: 4.17 MIL/uL — AB (ref 4.22–5.81)
RDW: 19.7 % — ABNORMAL HIGH (ref 11.5–15.5)
WBC: 18.2 10*3/uL — AB (ref 4.0–10.5)
nRBC: 0 /100 WBC

## 2013-07-11 LAB — GLUCOSE, CAPILLARY
Glucose-Capillary: 124 mg/dL — ABNORMAL HIGH (ref 70–99)
Glucose-Capillary: 111 mg/dL — ABNORMAL HIGH (ref 70–99)

## 2013-07-11 LAB — PRO B NATRIURETIC PEPTIDE: Pro B Natriuretic peptide (BNP): 306.5 pg/mL — ABNORMAL HIGH (ref 0–125)

## 2013-07-11 MED ORDER — FERROUS SULFATE DRIED 200 (65 FE) MG PO TABS: 1.0000 | ORAL_TABLET | Freq: Every day | ORAL | Status: DC

## 2013-07-11 MED ORDER — ACETAMINOPHEN 325 MG PO TABS
650.0000 mg | ORAL_TABLET | Freq: Four times a day (QID) | ORAL | Status: DC | PRN
  Administered 2013-07-11 – 2013-07-13 (×2): 650 mg via ORAL
  Filled 2013-07-11 (×2): qty 2

## 2013-07-11 MED ORDER — FUROSEMIDE 80 MG PO TABS
80.0000 mg | ORAL_TABLET | Freq: Two times a day (BID) | ORAL | Status: DC
  Administered 2013-07-11 – 2013-07-14 (×6): 80 mg via ORAL
  Filled 2013-07-11 (×8): qty 1

## 2013-07-11 MED ORDER — CLINDAMYCIN PHOSPHATE 600 MG/50ML IV SOLN
600.0000 mg | Freq: Once | INTRAVENOUS | Status: DC
  Administered 2013-07-11: 600 mg via INTRAVENOUS

## 2013-07-11 MED ORDER — POTASSIUM CHLORIDE CRYS ER 20 MEQ PO TBCR
20.0000 meq | EXTENDED_RELEASE_TABLET | Freq: Every day | ORAL | Status: DC
  Administered 2013-07-11: 20 meq via ORAL
  Filled 2013-07-11 (×2): qty 1

## 2013-07-11 MED ORDER — ACETAMINOPHEN 650 MG RE SUPP
650.0000 mg | Freq: Four times a day (QID) | RECTAL | Status: DC | PRN
Start: 2013-07-11 — End: 2013-07-14

## 2013-07-11 MED ORDER — SODIUM CHLORIDE 0.9 % IV SOLN: INTRAVENOUS | Status: DC

## 2013-07-11 MED ORDER — CLINDAMYCIN PHOSPHATE 600 MG/50ML IV SOLN
600.0000 mg | Freq: Three times a day (TID) | INTRAVENOUS | Status: DC
  Administered 2013-07-11 – 2013-07-13 (×7): 600 mg via INTRAVENOUS
  Filled 2013-07-11 (×8): qty 50

## 2013-07-11 MED ORDER — ASPIRIN 81 MG PO TABS
81.0000 mg | ORAL_TABLET | Freq: Every day | ORAL | Status: DC
  Filled 2013-07-11: qty 1

## 2013-07-11 MED ORDER — ENOXAPARIN SODIUM 30 MG/0.3ML ~~LOC~~ SOLN
30.0000 mg | SUBCUTANEOUS | Status: DC
  Administered 2013-07-11: 30 mg via SUBCUTANEOUS
  Filled 2013-07-11 (×2): qty 0.3

## 2013-07-11 MED ORDER — LISINOPRIL 20 MG PO TABS
20.0000 mg | ORAL_TABLET | Freq: Every day | ORAL | Status: DC
  Administered 2013-07-12 – 2013-07-14 (×3): 20 mg via ORAL
  Filled 2013-07-11 (×3): qty 1

## 2013-07-11 MED ORDER — FLUTICASONE PROPIONATE 50 MCG/ACT NA SUSP
2.0000 | Freq: Every day | NASAL | Status: DC
  Administered 2013-07-11 – 2013-07-14 (×4): 2 via NASAL
  Filled 2013-07-11 (×2): qty 16

## 2013-07-11 MED ORDER — ACETAMINOPHEN 325 MG PO TABS: 650.0000 mg | ORAL_TABLET | Freq: Four times a day (QID) | ORAL | Status: DC | PRN

## 2013-07-11 MED ORDER — ASPIRIN EC 81 MG PO TBEC
81.0000 mg | DELAYED_RELEASE_TABLET | Freq: Every day | ORAL | Status: DC
  Administered 2013-07-11 – 2013-07-14 (×4): 81 mg via ORAL
  Filled 2013-07-11 (×4): qty 1

## 2013-07-11 MED ORDER — OXYCODONE HCL 5 MG PO TABS: 5.0000 mg | ORAL_TABLET | ORAL | Status: DC | PRN

## 2013-07-11 MED ORDER — FERROUS SULFATE 325 (65 FE) MG PO TABS
325.0000 mg | ORAL_TABLET | Freq: Every day | ORAL | Status: DC
  Administered 2013-07-11 – 2013-07-14 (×4): 325 mg via ORAL
  Filled 2013-07-11 (×8): qty 1

## 2013-07-11 MED ORDER — FENTANYL CITRATE 0.05 MG/ML IJ SOLN
25.0000 ug | Freq: Once | INTRAMUSCULAR | Status: AC
  Administered 2013-07-11: 25 ug via INTRAVENOUS
  Filled 2013-07-11: qty 2

## 2013-07-11 MED ORDER — FOLIC ACID 1 MG PO TABS
1.0000 mg | ORAL_TABLET | Freq: Every day | ORAL | Status: DC
  Administered 2013-07-11 – 2013-07-14 (×4): 1 mg via ORAL
  Filled 2013-07-11 (×4): qty 1

## 2013-07-11 MED ORDER — CLINDAMYCIN PHOSPHATE 900 MG/50ML IV SOLN
900.0000 mg | Freq: Once | INTRAVENOUS | Status: AC
  Administered 2013-07-11: 900 mg via INTRAVENOUS
  Filled 2013-07-11: qty 50

## 2013-07-11 MED ORDER — MORPHINE SULFATE 2 MG/ML IJ SOLN: 1.0000 mg | INTRAMUSCULAR | Status: DC | PRN

## 2013-07-11 NOTE — ED Notes (Signed)
Called Flow Manager on status of bed placement. States holding for Medical / Surgical bed.

## 2013-07-11 NOTE — ED Notes (Signed)
Food tray given to patient 

## 2013-07-11 NOTE — ED Notes (Signed)
Pt states he started abx a couple months to treat an infection in his left leg. Pt was instructed to return to the ED for increase pain and swelling. Pt presents with bilateral lower leg redness, swelling and weeping. Pt reports more pain in left leg than right leg.

## 2013-07-11 NOTE — ED Notes (Signed)
Pt states he has noticed some swelling in his legs for 4 days, getting noticeable worse yesterday

## 2013-07-11 NOTE — ED Provider Notes (Signed)
CSN: [id number]     Arrival date & time 07/11/13  7510 History   First MD Initiated Contact with Patient 07/11/13 [phone]     Chief Complaint  Patient presents with  . Leg Swelling     (Consider location/radiation/quality/duration/timing/severity/associated sxs/prior Treatment) HPI Comments: Patient is a 73 year old male past medical history significant for CAD, CHF, DM, hypertension, anemia presented to the emergency department for bilateral leg swelling (L > R) with associated sharp burning moderate to severe pain and white drainage from leg over the last 4 days. He states the swelling got noticeably worse yesterday. He attempts to take too old antibiotic pills yesterday with no improvement. Patient states this feels like previous cellulitic infections he has had in the past. Patient states he has been taking all of his medications as prescribed. Endorses subjective chills and generalized headaches. Denies any fevers, CP, SOB, abdominal pain, nausea, vomiting.    Past Medical History  Diagnosis Date  . CAD (coronary artery disease)     dx elsewheer in past, no documentation. Non-ischemic myovue 2007  . CHF (congestive heart failure)     dx elsewhere, no documentation. Normal EF by previous echo. Trival AS needs f/u ECHO by 2012  . Diabetes mellitus   . Hypertension   . Sleep apnea, obstructive     at some point used CPAP, was d/c  years ago  . Anemia   . History of thrombocytosis   . Allergic rhinitis   . Edema     R>L leg, u/s 5-12 neg for DVT  . Hemorrhoid    Past Surgical History  Procedure Laterality Date  . Foot surgery      right toe surgery   Family History  Problem Relation Age of Onset  . Colon cancer Neg Hx   . Prostate cancer Neg Hx   . Heart attack Neg Hx   . Diabetes Neg Hx   . Schizophrenia Son    History  Substance Use Topics  . Smoking status: Former Smoker -- 10 years    Types: Cigarettes    Quit date: 05/18/1966  . Smokeless tobacco: Never Used  .  Alcohol Use: No    Review of Systems  Constitutional: Positive for chills (subjective). Negative for fever.  Respiratory: Negative for cough and shortness of breath.   Cardiovascular: Positive for leg swelling. Negative for chest pain.  Gastrointestinal: Negative for nausea, vomiting and abdominal pain.  Musculoskeletal: Positive for myalgias.  Neurological: Positive for headaches. Negative for syncope.  All other systems reviewed and are negative.      Allergies  Review of patient's allergies indicates no known allergies.  Home Medications   Current Outpatient Rx  Name  Route  Sig  Dispense  Refill  . acetaminophen (TYLENOL) 500 MG tablet   Oral   Take 1,000 mg by mouth every 6 (six) hours as needed for pain.         Marland Kitchen aspirin 81 MG tablet   Oral   Take 81 mg by mouth daily.         Marland Kitchen b complex vitamins capsule   Oral   Take 1 capsule by mouth daily.   30 capsule   3   . Ferrous Sulfate Dried 200 (65 FE) MG TABS   Oral   Take 1 tablet by mouth daily.          . fluticasone (FLONASE) 50 MCG/ACT nasal spray   Each Nare   Place 2 sprays into both nostrils daily.          Marland Kitchen  folic acid (FOLVITE) 1 MG tablet   Oral   Take 1 tablet (1 mg total) by mouth daily.   30 tablet   3   . furosemide (LASIX) 40 MG tablet   Oral   Take 80 mg by mouth 2 (two) times daily.          Marland Kitchen HYDROcodone-acetaminophen (NORCO/VICODIN) 5-325 MG per tablet   Oral   Take 1 tablet by mouth every 4 (four) hours as needed.   20 tablet   0   . lisinopril (PRINIVIL,ZESTRIL) 20 MG tablet   Oral   Take 20 mg by mouth daily.         . potassium chloride SA (K-DUR,KLOR-CON) 20 MEQ tablet   Oral   Take 20 mEq by mouth daily.          BP 130/68  Pulse 60  Temp(Src) 97.6 F (36.4 C) (Oral)  Resp 16  SpO2 97% Physical Exam  Nursing note and vitals reviewed. Constitutional: He is oriented to person, place, and time. He appears well-developed and well-nourished. No  distress.  HENT:  Head: Normocephalic and atraumatic.  Right Ear: External ear normal.  Left Ear: External ear normal.  Nose: Nose normal.  Mouth/Throat: Oropharynx is clear and moist. No oropharyngeal exudate.  Eyes: Conjunctivae and EOM are normal.  Neck: Normal range of motion. Neck supple.  Cardiovascular: Normal rate, regular rhythm, normal heart sounds and intact distal pulses.   Pulmonary/Chest: Effort normal and breath sounds normal. No respiratory distress.  Abdominal: Soft. Bowel sounds are normal. There is no tenderness.  Musculoskeletal: Normal range of motion. He exhibits edema (2+ pitting edema bilateral LE) and tenderness (LE tenderness).  Neurological: He is alert and oriented to person, place, and time.  Moves all four extremities  Skin: Skin is warm and dry. No rash noted. He is not diaphoretic. There is erythema (and warmth to BLE).     Stasis skin changes noted to BLE.  Area of weeping noted on graphical documentation.   Psychiatric: He has a normal mood and affect.    ED Course  Procedures (including critical care time) Medications  fentaNYL (SUBLIMAZE) injection 25 mcg (25 mcg Intravenous Given 07/11/13 0640)  clindamycin (CLEOCIN) IVPB 900 mg (900 mg Intravenous New Bag/Given 07/11/13 0734)  fentaNYL (SUBLIMAZE) injection 25 mcg (25 mcg Intravenous Given 07/11/13 0802)    Labs Review Labs Reviewed  CBC WITH DIFFERENTIAL - Abnormal; Notable for the following:    WBC 18.2 (*)    RBC 4.17 (*)    Hemoglobin 10.6 (*)    HCT 33.7 (*)    MCH 25.4 (*)    RDW 19.7 (*)    Basophils Relative 6 (*)    Neutro Abs 11.3 (*)    Lymphs Abs 4.4 (*)    Basophils Absolute 1.1 (*)    All other components within normal limits  BASIC METABOLIC PANEL - Abnormal; Notable for the following:    BUN 31 (*)    Creatinine, Ser 1.56 (*)    GFR calc non Af Amer 43 (*)    GFR calc Af Amer 50 (*)    All other components within normal limits  PRO B NATRIURETIC PEPTIDE - Abnormal;  Notable for the following:    Pro B Natriuretic peptide (BNP) 306.5 (*)    All other components within normal limits  WOUND CULTURE   Imaging Review No results found.  EKG Interpretation   None       MDM  Final diagnoses:  Cellulitis    Filed Vitals:   07/11/13 0800  BP: 130/68  Pulse: 60  Temp:   Resp: 16   Afebrile, NAD, non-toxic appearing, AAOx3. 73 year old male with PMH of CAD, CHF, Mild AS, venous insufficiency, who presents today for increased swelling of his legs with erythema and warmth. BLE 2+ pitting edema, with erythema and warmth. TTP. LLE with some drainage. Wound culture obtained. Leukocytosis at 18.2. BUN and Cr mildly elevated, but no evidence of acute change. IV Clindamycin given. Will admit for observation.       Harlow Mares, PA-C 07/11/13 [id number]

## 2013-07-11 NOTE — H&P (Signed)
[facility] History and Physical  Juan [name] NIO:[id number] DOB: September 06, 1940 DOA: 07/11/2013  Referring physician: Baron Sane, ER PA PCP: [name], MD   Chief Complaint: Leg swelling  HPI: Juan [name] is a 73 y.o. male  With past medical history of diastolic CHF and chronic venous stasis who noted over the past few days to have episodes of sharp burning pain in both legs left greater than right plus some minimal drainage from the back of both as well as increased swelling in the past day. Patient had an old antibiotic pills and try to take those, but this did not help. He has had previous cellulitis infections in the past so he came into the emergency room for further evaluation. Lab work noted a white blood cell count of 18, a normal BNP and patient was given a dose of IV clindamycin. Hospitalists were called for further evaluation.   Review of Systems:  Patient seen down emergency room. He is doing okay. Feeling will do better. Denies any headaches, vision changes, dysphagia, chest pain, palpitations, shortness of breath, wheeze, cough, abdominal pain, hematuria, dysuria, constipation, diarrhea. His overall complaint is of generalized leg swelling and occasional pains in his lower extremities. His review systems otherwise negative.   Past Medical History  Diagnosis Date  . CAD (coronary artery disease)     dx elsewheer in past, no documentation. Non-ischemic myovue 2007  . CHF (congestive heart failure)     dx elsewhere, no documentation. Normal EF by previous echo. Trival AS needs f/u ECHO by 2012  . Diabetes mellitus   . Hypertension   . Sleep apnea, obstructive     at some point used CPAP, was d/c  years ago  . Anemia   . History of thrombocytosis   . Allergic rhinitis   . Edema     R>L leg, u/s 5-12 neg for DVT  . Hemorrhoid    Past Surgical History  Procedure Laterality Date  . Foot surgery      right toe surgery   Social History:  reports that he quit  smoking about 47 years ago. His smoking use included Cigarettes. He smoked 0.00 packs per day for 10 years. He has never used smokeless tobacco. He reports that he does not drink alcohol or use illicit drugs. patient lives at home with his son. He is normally able to ambulate using a cane  No Known Allergies  Family History  Problem Relation Age of Onset  . Colon cancer Neg Hx   . Prostate cancer Neg Hx   . Heart attack Neg Hx   . Diabetes Neg Hx   . Schizophrenia Son      Prior to Admission medications   Medication Sig Start Date End Date Taking? Authorizing Provider  acetaminophen (TYLENOL) 500 MG tablet Take 1,000 mg by mouth every 6 (six) hours as needed for pain.   Yes Historical Provider, MD  aspirin 81 MG tablet Take 81 mg by mouth daily.   Yes Historical Provider, MD  b complex vitamins capsule Take 1 capsule by mouth daily. 01/12/13  Yes Irene Pap, NP  Ferrous Sulfate Dried 200 (65 FE) MG TABS Take 1 tablet by mouth daily.    Yes Historical Provider, MD  fluticasone (FLONASE) 50 MCG/ACT nasal spray Place 2 sprays into both nostrils daily.    Yes Historical Provider, MD  folic acid (FOLVITE) 1 MG tablet Take 1 tablet (1 mg total) by mouth daily. 05/09/12  Yes Colon Branch, MD  furosemide (LASIX) 40 MG tablet Take 80 mg by mouth 2 (two) times daily.  04/28/13  Yes [name], MD  HYDROcodone-acetaminophen (NORCO/VICODIN) 5-325 MG per tablet Take 1 tablet by mouth every 4 (four) hours as needed. 04/12/13  Yes Colon Branch, MD  lisinopril (PRINIVIL,ZESTRIL) 20 MG tablet Take 20 mg by mouth daily.   Yes Historical Provider, MD  potassium chloride SA (K-DUR,KLOR-CON) 20 MEQ tablet Take 20 mEq by mouth daily.   Yes Historical Provider, MD   Physical Exam: Filed Vitals:   07/11/13 1400  BP: 139/60  Pulse: 84  Temp:   Resp:     BP 139/60  Pulse 84  Temp(Src) 98.6 F (37 C) (Oral)  Resp 20  SpO2 98%  General:  Alert and oriented x3, no acute distress HEENT:  Normocephalic, atraumatic, mucous membranes are slightly dry Cardiovascular: Regular rate and rhythm, S1-S2 Lungs: Clear to auscultation bilaterally Abdomen: Soft, nontender, nondistended, positive bowel sounds Extremities: No clubbing. Bilateral venous stasis and 2+ edema from the knees down to the feet. Port toenail maintenance with thickened nails. Evidence of erythema with some minimal drainage in the posterior aspect of both calves. Overall there is mild decreased sensation           Labs on Admission:  Basic Metabolic Panel:  Recent Labs Lab 07/11/13 0602  NA 139  K 4.5  CL 100  CO2 23  GLUCOSE 99  BUN 31*  CREATININE 1.56*  CALCIUM 10.1   Liver Function Tests: No results found for this basename: AST, ALT, ALKPHOS, BILITOT, PROT, ALBUMIN,  in the last 168 hours No results found for this basename: LIPASE, AMYLASE,  in the last 168 hours No results found for this basename: AMMONIA,  in the last 168 hours CBC:  Recent Labs Lab 07/11/13 0602  WBC 18.2*  NEUTROABS 11.3*  HGB 10.6*  HCT 33.7*  MCV 80.8  PLT 327   Cardiac Enzymes: No results found for this basename: CKTOTAL, CKMB, CKMBINDEX, TROPONINI,  in the last 168 hours  BNP (last 3 results)  Recent Labs  11/30/12 2330 07/11/13 0602  PROBNP 502.6* 306.5*   CBG: No results found for this basename: GLUCAP,  in the last 168 hours    Assessment/Plan Active Problems:   HYPERTENSION: Continue home meds.    Chronic diastolic CHF (congestive heart failure): BNP normal. Watch IV fluids and minimal hydration.    Chronic lower extremity edema, stasis dermatitis: Noted.    Cellulitis: Continue IV clindamycin. Recheck white blood cell count tomorrow. Awaiting wound cultures.    Code Status: Full code as per patient.  Family Communication: Left message with son.  Disposition Plan: Home tomorrow if white blood cell count normalized.  Time spent: 30 minutes  [facility] Pager  [phone]

## 2013-07-11 NOTE — Progress Notes (Signed)
ANTIBIOTIC CONSULT NOTE - INITIAL  Pharmacy Consult for clindamycin Indication: cellulitis  No Known Allergies  Patient Measurements:    Body Weight: 122 kg  Vital Signs: Temp: 98.8 F (37.1 C) (02/24 1514) Temp src: Oral (02/24 1514) BP: 131/65 mmHg (02/24 1514) Pulse Rate: 75 (02/24 1514) Intake/Output from previous day:   Intake/Output from this shift: Total I/O In: -  Out: 400 [Urine:400]  Labs:  Recent Labs  07/11/13 0602  WBC 18.2*  HGB 10.6*  PLT 327  CREATININE 1.56*   The CrCl is unknown because both a height and weight (above a minimum accepted value) are required for this calculation. No results found for this basename: VANCOTROUGH, Corlis Leak, VANCORANDOM, GENTTROUGH, GENTPEAK, GENTRANDOM, TOBRATROUGH, TOBRAPEAK, TOBRARND, AMIKACINPEAK, AMIKACINTROU, AMIKACIN,  in the last 72 hours   Microbiology: Recent Results (from the past 720 hour(s))  WOUND CULTURE     Status: None   Collection Time    07/11/13  7:38 AM      Result Value Ref Range Status   Specimen Description WOUND LEG RIGHT   Final   Special Requests Normal   Final   Gram Stain     Final   Value: RARE WBC PRESENT, PREDOMINANTLY PMN     RARE SQUAMOUS EPITHELIAL CELLS PRESENT     MODERATE GRAM NEGATIVE RODS     MODERATE GRAM POSITIVE COCCI     IN PAIRS   Culture PENDING   Incomplete   Report Status PENDING   Incomplete    Medical History: Past Medical History  Diagnosis Date  . CAD (coronary artery disease)     dx elsewheer in past, no documentation. Non-ischemic myovue 2007  . CHF (congestive heart failure)     dx elsewhere, no documentation. Normal EF by previous echo. Trival AS needs f/u ECHO by 2012  . Diabetes mellitus   . Hypertension   . Sleep apnea, obstructive     at some point used CPAP, was d/c  years ago  . Anemia   . History of thrombocytosis   . Allergic rhinitis   . Edema     R>L leg, u/s 5-12 neg for DVT  . Hemorrhoid     Medications:  Prescriptions prior to  admission  Medication Sig Dispense Refill  . acetaminophen (TYLENOL) 500 MG tablet Take 1,000 mg by mouth every 6 (six) hours as needed for pain.      Marland Kitchen aspirin 81 MG tablet Take 81 mg by mouth daily.      Marland Kitchen b complex vitamins capsule Take 1 capsule by mouth daily.  30 capsule  3  . Ferrous Sulfate Dried 200 (65 FE) MG TABS Take 1 tablet by mouth daily.       . fluticasone (FLONASE) 50 MCG/ACT nasal spray Place 2 sprays into both nostrils daily.       . folic acid (FOLVITE) 1 MG tablet Take 1 tablet (1 mg total) by mouth daily.  30 tablet  3  . furosemide (LASIX) 40 MG tablet Take 80 mg by mouth 2 (two) times daily.       Marland Kitchen HYDROcodone-acetaminophen (NORCO/VICODIN) 5-325 MG per tablet Take 1 tablet by mouth every 4 (four) hours as needed.  20 tablet  0  . lisinopril (PRINIVIL,ZESTRIL) 20 MG tablet Take 20 mg by mouth daily.      . potassium chloride SA (K-DUR,KLOR-CON) 20 MEQ tablet Take 20 mEq by mouth daily.       Assessment: 73 yo man to start clindamycin for cellulitis.  He recevied 900 mg at 0730.  Goal of Therapy:  Eradication of infection  Plan:  Clindamycin 600 mg IV q8 hours F/u cultures and clinical course  Britain Saber Poteet 07/11/2013,4:51 PM

## 2013-07-11 NOTE — ED Notes (Signed)
REPORT CALLED TO ANITA ON 6 EAST

## 2013-07-11 NOTE — ED Notes (Signed)
PA-C at bedside 

## 2013-07-11 NOTE — ED Notes (Signed)
Ordered food tray for patient.

## 2013-07-12 DIAGNOSIS — L02419 Cutaneous abscess of limb, unspecified: Secondary | ICD-10-CM | POA: Diagnosis not present

## 2013-07-12 DIAGNOSIS — D649 Anemia, unspecified: Secondary | ICD-10-CM | POA: Diagnosis not present

## 2013-07-12 DIAGNOSIS — R609 Edema, unspecified: Secondary | ICD-10-CM | POA: Diagnosis not present

## 2013-07-12 DIAGNOSIS — L0291 Cutaneous abscess, unspecified: Secondary | ICD-10-CM | POA: Diagnosis not present

## 2013-07-12 DIAGNOSIS — L03119 Cellulitis of unspecified part of limb: Principal | ICD-10-CM

## 2013-07-12 LAB — GLUCOSE, CAPILLARY
GLUCOSE-CAPILLARY: 92 mg/dL (ref 70–99)
Glucose-Capillary: 80 mg/dL (ref 70–99)
Glucose-Capillary: 74 mg/dL (ref 70–99)

## 2013-07-12 LAB — CBC
HEMATOCRIT: 30.7 % — AB (ref 39.0–52.0)
Hemoglobin: 9.7 g/dL — ABNORMAL LOW (ref 13.0–17.0)
MCH: 25.4 pg — ABNORMAL LOW (ref 26.0–34.0)
MCHC: 31.6 g/dL (ref 30.0–36.0)
MCV: 80.4 fL (ref 78.0–100.0)
Platelets: 307 10*3/uL (ref 150–400)
RBC: 3.82 MIL/uL — ABNORMAL LOW (ref 4.22–5.81)
RDW: 19.7 % — AB (ref 11.5–15.5)
WBC: 15.6 10*3/uL — AB (ref 4.0–10.5)

## 2013-07-12 LAB — BASIC METABOLIC PANEL
BUN: 29 mg/dL — ABNORMAL HIGH (ref 6–23)
CHLORIDE: 105 meq/L (ref 96–112)
CO2: 25 mEq/L (ref 19–32)
CREATININE: 1.51 mg/dL — AB (ref 0.50–1.35)
Calcium: 9.7 mg/dL (ref 8.4–10.5)
GFR calc non Af Amer: 45 mL/min — ABNORMAL LOW (ref >90)
GFR, EST AFRICAN AMERICAN: 52 mL/min — AB (ref >90)
Glucose, Bld: 119 mg/dL — ABNORMAL HIGH (ref 70–99)
POTASSIUM: 5.2 meq/L (ref 3.7–5.3)
Sodium: 141 mEq/L (ref 137–147)

## 2013-07-12 LAB — PATHOLOGIST SMEAR REVIEW

## 2013-07-12 MED ORDER — ENOXAPARIN SODIUM 60 MG/0.6ML ~~LOC~~ SOLN
60.0000 mg | SUBCUTANEOUS | Status: DC
Start: 2013-07-12 — End: 2013-07-14
  Administered 2013-07-12 – 2013-07-13 (×2): 60 mg via SUBCUTANEOUS
  Filled 2013-07-12 (×3): qty 0.6

## 2013-07-12 NOTE — Progress Notes (Signed)
UR completed. Patient changed to inpatient- requiring IV antibiotics.

## 2013-07-12 NOTE — Progress Notes (Signed)
[facility]                                                                              Patient Demographics  Juan [name], is a 73 y.o. male, DOB - 1940-10-04, KWI:[id number]  Admit date - 07/11/2013   Admitting Physician Juan Brod, MD  Outpatient Primary MD for the patient is Juan November, MD  LOS - 1   Chief Complaint  Patient presents with  . Leg Swelling        Assessment & Plan   Lower extremity cellulitis -Currently pending wound cultures -Will continue IV clindamycin -Leukocytosis trending downward -Continue care with dressing changes  Hypertension -Currently controlled -Continue Lasix 80 mg twice daily, lisinopril 20 mg daily  Chronic diastolic CHF -Currently stable, patient is not in exacerbation it appears to be euvolemic -BNP 306 -Continue daily weights, monitoring input and output, fluid restriction -Continue Lasix, aspirin, lisinopril  Chronic lower extremity edema with stasis dermatitis -Currently stable  Anemia -Currently stable, likely secondary to chronic disease. -Will continue to monitor  Acute kidney injury chronic kidney disease -Patient's baseline creatinine is approximately 1.3, currently 1.5 -May be due to his Lasix, discontinue at this time due to patient's history of CHF in lower chimneys cellulitis with lower extremity edema  Code Status: Full  Family Communication: None at bedside  Disposition Plan: Admitted, will likely discharge to home tomorrow  Time Spent in minutes   30 minutes  Procedures None  Consults  Wound  DVT Prophylaxis  Lovenox  Lab Results  Component Value Date   PLT 307 07/12/2013    Medications  Scheduled Meds: . aspirin EC  81 mg Oral Daily  . clindamycin (CLEOCIN) IV  600 mg Intravenous 3 times per day  . enoxaparin (LOVENOX) injection  30 mg Subcutaneous Q24H  . ferrous sulfate  325 mg Oral Q breakfast  . fluticasone  2 spray Each Nare Daily  . folic acid  1 mg Oral Daily  . furosemide   80 mg Oral BID  . lisinopril  20 mg Oral Daily   Continuous Infusions: . sodium chloride     PRN Meds:.acetaminophen, acetaminophen, morphine injection, oxyCODONE  Antibiotics    Anti-infectives   Start     Dose/Rate Route Frequency Ordered Stop   07/11/13 1700  clindamycin (CLEOCIN) IVPB 600 mg     600 mg 100 mL/hr over 30 Minutes Intravenous 3 times per day 07/11/13 1655     07/11/13 1530  clindamycin (CLEOCIN) IVPB 600 mg  Status:  Discontinued     600 mg 100 mL/hr over 30 Minutes Intravenous  Once 07/11/13 1527 07/11/13 1655   07/11/13 0730  clindamycin (CLEOCIN) IVPB 900 mg     900 mg 100 mL/hr over 30 Minutes Intravenous  Once 07/11/13 2426 07/11/13 0815        Subjective:   Juan [name] seen and examined today.  Patient continues to complain of pain in his legs, particularly more in the left.     Objective:   Filed Vitals:   07/11/13 1823 07/11/13 2040 07/12/13 0454 07/12/13 0813  BP: 138/72 122/67 133/53 109/66  Pulse: 78 80 75 77  Temp: 98.6 F (37  C) 98.7 F (37.1 C) 98.4 F (36.9 C) 97.6 F (36.4 C)  TempSrc: Oral Oral Oral Oral  Resp: 20 20 20 16   Height:   6\' 3"  (1.905 m)   Weight:   125 kg (275 lb 9.2 oz)   SpO2: 100% 96% 95% 100%    Wt Readings from Last 3 Encounters:  07/12/13 125 kg (275 lb 9.2 oz)  06/15/13 122.018 kg (269 lb)  04/28/13 119.296 kg (263 lb)     Intake/Output Summary (Last 24 hours) at 07/12/13 1001 Last data filed at 07/12/13 0924  Gross per 24 hour  Intake   1200 ml  Output    700 ml  Net    500 ml    Exam  General: Well developed, well nourished, NAD, appears stated age  HEENT: NCAT, PERRLA, EOMI, Anicteic Sclera, mucous membranes moist.   Neck: Supple, no JVD, no masses  Cardiovascular: S1 S2 auscultated, no rubs, murmurs or gallops. Regular rate and rhythm.  Respiratory: Clear to auscultation bilaterally with equal chest rise  Abdomen: Soft, obese, nontender, nondistended, + bowel sounds  Extremities:  warm dry without cyanosis clubbing.  Bilateral venous stasis in LE.  2+ edema in LE B/L. Dressing in place on LLE.  RLE shows healed wounds.   Neuro: AAOx3, cranial nerves grossly intact. Decreased sensation on lower ext bilaterally.  Motor strength intact bilaterally.  Skin: Without rashes exudates or nodules  Psych: Normal affect and demeanor with intact judgement and insight  Data Review   Micro Results Recent Results (from the past 240 hour(s))  WOUND CULTURE     Status: None   Collection Time    07/11/13  7:38 AM      Result Value Ref Range Status   Specimen Description WOUND LEG RIGHT   Final   Special Requests Normal   Final   Gram Stain     Final   Value: RARE WBC PRESENT, PREDOMINANTLY PMN     RARE SQUAMOUS EPITHELIAL CELLS PRESENT     MODERATE GRAM NEGATIVE RODS     MODERATE GRAM POSITIVE COCCI     IN PAIRS   Culture     Final   Value: Culture reincubated for better growth     Performed at [facility]   Report Status PENDING   Incomplete    Radiology Reports No results found.  CBC  Recent Labs Lab 07/11/13 0602 07/12/13 0512  WBC 18.2* 15.6*  HGB 10.6* 9.7*  HCT 33.7* 30.7*  PLT 327 307  MCV 80.8 80.4  MCH 25.4* 25.4*  MCHC 31.5 31.6  RDW 19.7* 19.7*  LYMPHSABS 4.4*  --   MONOABS 0.9  --   EOSABS 0.5  --   BASOSABS 1.1*  --     Chemistries   Recent Labs Lab 07/11/13 0602 07/12/13 0512  NA 139 141  K 4.5 5.2  CL 100 105  CO2 23 25  GLUCOSE 99 119*  BUN 31* 29*  CREATININE 1.56* 1.51*  CALCIUM 10.1 9.7   ------------------------------------------------------------------------------------------------------------------ estimated creatinine clearance is 63.9 ml/min (by C-G formula based on Cr of 1.51). ------------------------------------------------------------------------------------------------------------------ No results found for this basename: HGBA1C,  in the last 72  hours ------------------------------------------------------------------------------------------------------------------ No results found for this basename: CHOL, HDL, LDLCALC, TRIG, CHOLHDL, LDLDIRECT,  in the last 72 hours ------------------------------------------------------------------------------------------------------------------ No results found for this basename: TSH, T4TOTAL, FREET3, T3FREE, THYROIDAB,  in the last 72 hours ------------------------------------------------------------------------------------------------------------------ No results found for this basename: VITAMINB12, FOLATE, FERRITIN, TIBC, IRON, RETICCTPCT,  in the last 72 hours  Coagulation profile No results found for this basename: INR, PROTIME,  in the last 168 hours  No results found for this basename: DDIMER,  in the last 72 hours  Cardiac Enzymes No results found for this basename: CK, CKMB, TROPONINI, MYOGLOBIN,  in the last 168 hours ------------------------------------------------------------------------------------------------------------------ No components found with this basename: POCBNP,     Manali Mcelmurry D.O. on 07/12/2013 at 10:01 AM  Between 7am to 7pm - Pager - [phone]  After 7pm go to [email or web address] - password [email or web address]  And look for the night coverage person covering for me after hours  [facility] Office  [phone]

## 2013-07-12 NOTE — ED Provider Notes (Signed)
Medical screening examination/treatment/procedure(s) were performed by non-physician practitioner and as supervising physician I was immediately available for consultation/collaboration.     Elyn Peers, MD 07/12/13 [phone]

## 2013-07-12 NOTE — Consult Note (Addendum)
WOC wound consult note Reason for Consult: Consult requested for left leg wound.  Pt has history of bilat previous stasis ulcers which have healed in the past and periodic cellulitis.  Previously marked cellulitis is receeding from area marked earlier.  Remains with generalized edema to BLE and skin changes consistent with chronic venous stasis and peau de' orange appearance. Wound type: Full thickness wound to posterior leg and left side of leg.  Current dressing of gauze is stuck to the wound bed and is painful to remove. Measurement: Left leg left side wound is partial thickness; 1X1X.1cm; 100% pink and moist.  Mod amt yellow drainage, no odor. Left posterior leg is 3X3X.2cm full thickness, 90% pink and moist, 10% yellow.  Mod amt yellow drainage, no odor.  Pt C/O itching and burning to both sites.  Right posterior leg does not have open wounds, but skin changes are as mentioned above and pt requests cream be applied to this area also. Dressing procedure/placement/frequency: Pt states zinc-based barrier cream has been effective to reduce itching and burning.  Continue barrier cream Q day and foam dressing to decrease adherence to wound bed.  Pt feels he could benefit from home health assistance for dressing changes; he has used this service before and felt it was very helpful. Please order if desired. Please re-consult if further assistance is needed.  Thank-you,  Julien Girt MSN, [place], [place], [place], [place]

## 2013-07-13 DIAGNOSIS — I5032 Chronic diastolic (congestive) heart failure: Secondary | ICD-10-CM | POA: Diagnosis not present

## 2013-07-13 DIAGNOSIS — L0291 Cutaneous abscess, unspecified: Secondary | ICD-10-CM | POA: Diagnosis not present

## 2013-07-13 DIAGNOSIS — L02419 Cutaneous abscess of limb, unspecified: Secondary | ICD-10-CM | POA: Diagnosis not present

## 2013-07-13 DIAGNOSIS — D649 Anemia, unspecified: Secondary | ICD-10-CM | POA: Diagnosis not present

## 2013-07-13 LAB — CBC
HCT: 30.9 % — ABNORMAL LOW (ref 39.0–52.0)
Hemoglobin: 10 g/dL — ABNORMAL LOW (ref 13.0–17.0)
MCH: 25.8 pg — AB (ref 26.0–34.0)
MCHC: 32.4 g/dL (ref 30.0–36.0)
MCV: 79.6 fL (ref 78.0–100.0)
PLATELETS: 355 10*3/uL (ref 150–400)
RBC: 3.88 MIL/uL — AB (ref 4.22–5.81)
RDW: 19.7 % — ABNORMAL HIGH (ref 11.5–15.5)
WBC: 18.6 10*3/uL — ABNORMAL HIGH (ref 4.0–10.5)

## 2013-07-13 LAB — BASIC METABOLIC PANEL
BUN: 30 mg/dL — ABNORMAL HIGH (ref 6–23)
CALCIUM: 9.6 mg/dL (ref 8.4–10.5)
CO2: 23 meq/L (ref 19–32)
Chloride: 103 mEq/L (ref 96–112)
Creatinine, Ser: 1.37 mg/dL — ABNORMAL HIGH (ref 0.50–1.35)
GFR calc Af Amer: 58 mL/min — ABNORMAL LOW (ref >90)
GFR, EST NON AFRICAN AMERICAN: 50 mL/min — AB (ref >90)
Glucose, Bld: 97 mg/dL (ref 70–99)
Potassium: 4.7 mEq/L (ref 3.7–5.3)
Sodium: 140 mEq/L (ref 137–147)

## 2013-07-13 MED ORDER — VANCOMYCIN HCL IN DEXTROSE 1-5 GM/200ML-% IV SOLN
1000.0000 mg | Freq: Two times a day (BID) | INTRAVENOUS | Status: DC
  Administered 2013-07-13 – 2013-07-14 (×2): 1000 mg via INTRAVENOUS
  Filled 2013-07-13 (×3): qty 200

## 2013-07-13 MED ORDER — VANCOMYCIN HCL 10 G IV SOLR
2000.0000 mg | Freq: Once | INTRAVENOUS | Status: AC
  Administered 2013-07-13: 2000 mg via INTRAVENOUS
  Filled 2013-07-13: qty 2000

## 2013-07-13 NOTE — Progress Notes (Signed)
[facility]                                                                              Patient Demographics  Juan [name], is a 73 y.o. male, DOB - 1940-09-28, VPX:[id number]  Admit date - 07/11/2013   Admitting Physician Annita Brod, MD  Outpatient Primary MD for the patient is [name], MD  LOS - 2   Chief Complaint  Patient presents with  . Leg Swelling        Assessment & Plan   Lower extremity cellulitis -Currently pending wound cultures, gram stain shows moderate GNR and GPC -Will continue antibiotics, however will discontinue clindamycin and start vancomycin. 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 -Will continue to monitor  Acute kidney injury chronic kidney disease -Creatinine 1.37 (from 1.56 at admission), around his baseline -May be due to his Lasix, will not discontinue at this time due to patient's history of CHF with lower extremity edema and cellutitis  Code Status: Full  Family Communication: None at bedside  Disposition Plan: Admitted, will likely discharge to home tomorrow  Time Spent in minutes   25 minutes  Procedures None  Consults  Wound Care  DVT Prophylaxis  Lovenox  Lab Results  Component Value Date   PLT 355 07/13/2013    Medications  Scheduled Meds: . aspirin EC  81 mg Oral Daily  . clindamycin (CLEOCIN) IV  600 mg Intravenous 3 times per day  . enoxaparin (LOVENOX) injection  60 mg Subcutaneous Q24H  . ferrous sulfate  325  mg Oral Q breakfast  . fluticasone  2 spray Each Nare Daily  . folic acid  1 mg Oral Daily  . furosemide  80 mg Oral BID  . lisinopril  20 mg Oral Daily  . vancomycin  2,000 mg Intravenous Once  . vancomycin  1,000 mg Intravenous Q12H   Continuous Infusions: . sodium chloride     PRN Meds:.acetaminophen, acetaminophen, morphine injection, oxyCODONE  Antibiotics    Anti-infectives   Start     Dose/Rate Route Frequency Ordered Stop   07/13/13 1800  vancomycin (VANCOCIN) IVPB 1000 mg/200 mL premix     1,000 mg 200 mL/hr over 60 Minutes Intravenous Every 12 hours 07/13/13 0711     07/13/13 0800  vancomycin (VANCOCIN) 2,000 mg in sodium chloride 0.9 % 500 mL IVPB     2,000 mg 250 mL/hr over 120 Minutes Intravenous  Once 07/13/13 0711     07/11/13 1700  clindamycin (CLEOCIN) IVPB 600 mg     600 mg 100 mL/hr over 30 Minutes Intravenous 3 times per day 07/11/13 1655     07/11/13 1530  clindamycin (CLEOCIN) IVPB 600 mg  Status:  Discontinued     600 mg 100 mL/hr over 30 Minutes Intravenous  Once 07/11/13 1527 07/11/13 1655   07/11/13 0730  clindamycin (CLEOCIN) IVPB 900 mg     900 mg 100 mL/hr over 30 Minutes Intravenous  Once 07/11/13 2751 07/11/13 0815        Subjective:   Juan [name] seen and examined today.  Patient continues to complain of pain in his legs, particularly more in the left.  He states it is some times sharp in character, however, sometimes it is "light pain".   Objective:   Filed Vitals:   07/12/13 1823 07/12/13 1836 07/12/13 2206 07/13/13 0500  BP: 133/67  126/54 124/42  Pulse: 84  62 71  Temp: 98.7 F (37.1 C) 98.9 F (37.2 C) 99.3 F (37.4 C) 99.1 F (37.3 C)  TempSrc: Oral Oral Oral Oral  Resp: 18  17 17   Height:      Weight:      SpO2: 99%  98% 100%    Wt Readings from Last 3 Encounters:  07/12/13 125 kg (275 lb 9.2 oz)  06/15/13 122.018 kg (269 lb)  04/28/13 119.296 kg (263 lb)     Intake/Output Summary (Last 24 hours) at 07/13/13  0747 Last data filed at 07/13/13 0501  Gross per 24 hour  Intake    770 ml  Output   1850 ml  Net  -1080 ml    Exam  General: Well developed, well nourished, NAD, appears stated age  77: NCAT, mucous membranes moist.   Neck: Supple, no JVD, no masses  Cardiovascular: S1 S2 auscultated. Regular rate and rhythm.  Respiratory: Clear to auscultation bilaterally with equal chest rise  Abdomen: Soft, obese, nontender, nondistended, + bowel sounds  Extremities: warm dry without cyanosis clubbing.  Bilateral venous stasis in LE.  2+ edema in LE B/L. Dressing in place on RLE and LLE.    Neuro: AAOx3, Decreased sensation on lower ext bilaterally.    Skin: Without rashes exudates or nodules  Psych: Normal affect and demeanor with intact judgement and insight  Data Review   Micro Results Recent Results (from the past 240 hour(s))  WOUND CULTURE     Status: None   Collection Time    07/11/13  7:38 AM      Result Value Ref Range Status   Specimen Description WOUND LEG RIGHT   Final   Special Requests Normal   Final   Gram Stain     Final   Value: RARE WBC PRESENT, PREDOMINANTLY PMN     RARE SQUAMOUS EPITHELIAL CELLS PRESENT     MODERATE GRAM NEGATIVE RODS     MODERATE GRAM POSITIVE COCCI     IN PAIRS   Culture     Final   Value: Culture reincubated for better growth     Performed at [facility]   Report Status PENDING   Incomplete    Radiology Reports No results found.  CBC  Recent Labs Lab 07/11/13 0602 07/12/13 0512 07/13/13 0415  WBC 18.2* 15.6* 18.6*  HGB 10.6* 9.7* 10.0*  HCT 33.7* 30.7* 30.9*  PLT 327 307 355  MCV 80.8 80.4 79.6  MCH 25.4* 25.4* 25.8*  MCHC 31.5 31.6 32.4  RDW 19.7* 19.7* 19.7*  LYMPHSABS 4.4*  --   --   MONOABS 0.9  --   --   EOSABS 0.5  --   --   BASOSABS 1.1*  --   --     Chemistries   Recent Labs Lab 07/11/13 0602 07/12/13 0512 07/13/13 0415  NA 139 141 140  K 4.5 5.2 4.7  CL 100 105 103  CO2 23 25 23    GLUCOSE 99 119* 97  BUN 31* 29* 30*  CREATININE 1.56* 1.51* 1.37*  CALCIUM 10.1 9.7 9.6   ------------------------------------------------------------------------------------------------------------------ estimated creatinine clearance is 70.4 ml/min (by C-G formula based on Cr of 1.37). ------------------------------------------------------------------------------------------------------------------ No results found for this basename: HGBA1C,  in the last 72 hours ------------------------------------------------------------------------------------------------------------------ No results found for this basename: CHOL, HDL, LDLCALC, TRIG, CHOLHDL, LDLDIRECT,  in the last 72 hours ------------------------------------------------------------------------------------------------------------------ No results found for this basename: TSH, T4TOTAL, FREET3, T3FREE, THYROIDAB,  in the last 72 hours ------------------------------------------------------------------------------------------------------------------ No results found for this basename: VITAMINB12, FOLATE, FERRITIN, TIBC, IRON, RETICCTPCT,  in the last 72 hours  Coagulation profile No results found for this basename: INR, PROTIME,  in the last 168 hours  No results found for this basename: DDIMER,  in the last 72 hours  Cardiac Enzymes No results found for this basename: CK, CKMB, TROPONINI, MYOGLOBIN,  in the last 168 hours ------------------------------------------------------------------------------------------------------------------ No components found with this basename: POCBNP,     Nealy Karapetian D.O. on 07/13/2013 at 7:47 AM  Between 7am to 7pm - Pager - [phone]  After 7pm go to [email or web address] - password [email or web address]  And look for the night coverage person covering for me after hours  [facility] Office  [phone]

## 2013-07-13 NOTE — Progress Notes (Signed)
ANTIBIOTIC CONSULT NOTE - INITIAL  Pharmacy Consult for Vancomycin Indication: cellulitis No Known Allergies  Patient Measurements: Height: 6\' 3"  (190.5 cm) Weight: 275 lb 9.2 oz (125 kg) IBW/kg (Calculated) : 84.5 Adjusted Body Weight: 100 kg   Vital Signs: Temp: 99.1 F (37.3 C) (02/26 0500) Temp src: Oral (02/26 0500) BP: 124/42 mmHg (02/26 0500) Pulse Rate: 71 (02/26 0500) Intake/Output from previous day: 02/25 0701 - 02/26 0700 In: 820 [P.O.:720; IV Piggyback:100] Out: 1850 [Urine:1850] Intake/Output from this shift:    Labs:  Recent Labs  07/11/13 0602 07/12/13 0512 07/13/13 0415  WBC 18.2* 15.6* 18.6*  HGB 10.6* 9.7* 10.0*  PLT 327 307 355  CREATININE 1.56* 1.51* 1.37*   Estimated Creatinine Clearance: 70.4 ml/min (by C-G formula based on Cr of 1.37). No results found for this basename: VANCOTROUGH, Corlis Leak, VANCORANDOM, GENTTROUGH, GENTPEAK, GENTRANDOM, TOBRATROUGH, TOBRAPEAK, TOBRARND, AMIKACINPEAK, AMIKACINTROU, AMIKACIN,  in the last 72 hours   Microbiology: Recent Results (from the past 720 hour(s))  WOUND CULTURE     Status: None   Collection Time    07/11/13  7:38 AM      Result Value Ref Range Status   Specimen Description WOUND LEG RIGHT   Final   Special Requests Normal   Final   Gram Stain     Final   Value: RARE WBC PRESENT, PREDOMINANTLY PMN     RARE SQUAMOUS EPITHELIAL CELLS PRESENT     MODERATE GRAM NEGATIVE RODS     MODERATE GRAM POSITIVE COCCI     IN PAIRS   Culture     Final   Value: Culture reincubated for better growth     Performed at [facility]   Report Status PENDING   Incomplete    Medical History: Past Medical History  Diagnosis Date  . CAD (coronary artery disease)     dx elsewheer in past, no documentation. Non-ischemic myovue 2007  . CHF (congestive heart failure)     dx elsewhere, no documentation. Normal EF by previous echo. Trival AS needs f/u ECHO by 2012  . Hypertension   . Sleep apnea, obstructive      at some point used CPAP, was d/c  years ago  . Anemia   . History of thrombocytosis   . Allergic rhinitis   . Edema     R>L leg, u/s 5-12 neg for DVT  . Hemorrhoid   . Type II diabetes mellitus   . Sinus congestion   . Migraine     "once/wk at least" (07/11/2013)    Medications:  Scheduled:  . aspirin EC  81 mg Oral Daily  . clindamycin (CLEOCIN) IV  600 mg Intravenous 3 times per day  . enoxaparin (LOVENOX) injection  60 mg Subcutaneous Q24H  . ferrous sulfate  325 mg Oral Q breakfast  . fluticasone  2 spray Each Nare Daily  . folic acid  1 mg Oral Daily  . furosemide  80 mg Oral BID  . lisinopril  20 mg Oral Daily   Assessment: 73 yo male with B LE cellulitis for empiric antibiotics  Goal of Therapy:  Vancomycin trough level 10-15 mcg/ml  Plan:  Vancomycin 2 g IV now, then 1 g IV q12h  Caryl Pina 07/13/2013,7:03 AM

## 2013-07-13 NOTE — Progress Notes (Signed)
Chaplain responded to spiritual care consult request. Patient was sitting on edge of bed observing the snow outside the window. He described watching the snow fall, staff/visitors come and go, and the plows work all night long. He said he was "experiencing some health issues" but trying to "relax, rest, and take things one day at a time." He requested prayer for his health. Chaplain provided hospitality, emotional and spiritual support, empathic listening, and prayer. Patient said prayer "really lifted him" and he was grateful for visit. Please page for follow up support.   [facility]: [phone]

## 2013-07-14 ENCOUNTER — Telehealth: Payer: Self-pay | Admitting: Internal Medicine

## 2013-07-14 DIAGNOSIS — L0291 Cutaneous abscess, unspecified: Secondary | ICD-10-CM | POA: Diagnosis not present

## 2013-07-14 DIAGNOSIS — I5032 Chronic diastolic (congestive) heart failure: Secondary | ICD-10-CM | POA: Diagnosis not present

## 2013-07-14 DIAGNOSIS — D759 Disease of blood and blood-forming organs, unspecified: Secondary | ICD-10-CM

## 2013-07-14 DIAGNOSIS — D649 Anemia, unspecified: Secondary | ICD-10-CM | POA: Diagnosis not present

## 2013-07-14 DIAGNOSIS — R609 Edema, unspecified: Secondary | ICD-10-CM | POA: Diagnosis not present

## 2013-07-14 LAB — CBC
HCT: 29.9 % — ABNORMAL LOW (ref 39.0–52.0)
Hemoglobin: 9.5 g/dL — ABNORMAL LOW (ref 13.0–17.0)
MCH: 25.5 pg — ABNORMAL LOW (ref 26.0–34.0)
MCHC: 31.8 g/dL (ref 30.0–36.0)
MCV: 80.2 fL (ref 78.0–100.0)
Platelets: 321 10*3/uL (ref 150–400)
RBC: 3.73 MIL/uL — ABNORMAL LOW (ref 4.22–5.81)
RDW: 19.5 % — AB (ref 11.5–15.5)
WBC: 17.7 10*3/uL — ABNORMAL HIGH (ref 4.0–10.5)

## 2013-07-14 LAB — GLUCOSE, CAPILLARY
GLUCOSE-CAPILLARY: 93 mg/dL (ref 70–99)
GLUCOSE-CAPILLARY: 142 mg/dL — AB (ref 70–99)

## 2013-07-14 MED ORDER — HYDROCODONE-ACETAMINOPHEN 5-325 MG PO TABS: 1.0000 | ORAL_TABLET | ORAL | Status: DC | PRN

## 2013-07-14 MED ORDER — ANAGRELIDE HCL 0.5 MG PO CAPS: 0.5000 mg | ORAL_CAPSULE | Freq: Two times a day (BID) | ORAL | Status: DC

## 2013-07-14 MED ORDER — CIPROFLOXACIN HCL 750 MG PO TABS: 750.0000 mg | ORAL_TABLET | Freq: Two times a day (BID) | ORAL | Status: DC

## 2013-07-14 MED ORDER — CIPROFLOXACIN HCL 750 MG PO TABS
750.0000 mg | ORAL_TABLET | Freq: Two times a day (BID) | ORAL | Status: DC
  Administered 2013-07-14: 750 mg via ORAL
  Filled 2013-07-14 (×2): qty 1

## 2013-07-14 NOTE — Progress Notes (Signed)
   CARE MANAGEMENT NOTE 07/14/2013  Patient:  Juan [name], Juan [name]   Account Number:  [id number]  Date Initiated:  07/14/2013  Documentation initiated by:  Lizabeth Leyden  Subjective/Objective Assessment:   admitted with cellulitis of lower leg     Action/Plan:   Home health RN   Anticipated DC Date:  07/14/2013   Anticipated DC Plan:  [place]  CM consult      [facility] Choice  HOME HEALTH   Choice offered to / List presented to:  C-1 Patient        [place] arranged  HH-1 RN      [place].   Status of service:  Completed, signed off Medicare Important Message given?   (If response is "NO", the following Medicare IM given date fields will be blank) Date Medicare IM given:   Date Additional Medicare IM given:    Discharge Disposition:  [place]  Per UR Regulation:    If discussed at Long Length of Stay Meetings, dates discussed:    Comments:  07/14/2013  [place], [place] CM referral: home health RN  Met with patient to discuss choice of home health agency for RN visits. [facility] and selected them.  [facility] called with referral.

## 2013-07-14 NOTE — Progress Notes (Signed)
Discussed discharge instructions and medications with pt. Pt showed no barriers to discharge. IV removed. Pt discharged to home with family. Assessment unchanged from morning.  

## 2013-07-14 NOTE — Discharge Summary (Signed)
Physician Discharge Summary  Juan [name] WCB:[id number] DOB: 23-Jul-1940 DOA: 07/11/2013  PCP: Juan November, MD  Admit date: 07/11/2013 Discharge date: 07/14/2013  Time spent: 35 minutes  Recommendations for Outpatient Follow-up:  Patient will be discharged home with home health and wound care services. He should continue taking his medications as prescribed. Patient should followup with primary care physician within one week of discharge. He should also have a CBC within one week of discharge.  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 male with past medical history of diastolic CHF and chronic venous stasis who noted over the past few days to have episodes of sharp burning pain in both legs left greater than right plus some minimal drainage from the back of both as well as increased swelling in the past day. Patient had an old antibiotic pills and try to take those, but this did not help. He has had previous cellulitis infections in the past so he came into the emergency room for further evaluation. Lab work noted a white blood cell count of 18, a normal BNP and patient was given a dose of IV clindamycin. Hospitalists were called for further evaluation.  Hospital Course:  Lower extremity cellulitis  -Currently pending wound cultures, gram stain shows moderate GNR and GPC, grew pseudomonas aeruginosa  -Will begin Cipro BID, and patient should continue as an outpatient -Leukocytosis trending downward -Continue care with dressing changes and wound care (home health) -Patient should followup with primary care physician within one week of discharge, to have a CBC as  well as evaluation of his wounds.  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  -Continue ferrous sulfate -Patient should have CBC checked within one week of discharge   Acute kidney injury chronic kidney disease  -Creatinine 1.37 (from 1.56 at admission), around his baseline  -May be due to his Lasix, will not discontinue at this time due to patient's history of CHF with lower extremity edema and cellutitis   History of thrombocythemia -Not relayed to the admitting physician.  Patient normally takes Agrylin 0.5mg  BID, however ran out of the prescription -refilled, patient should follow up with PCP  Procedures: None  Consultations: None  Discharge Exam: Filed Vitals:   07/14/13 0828  BP: 125/69  Pulse: 83  Temp: 98.4 F (36.9 C)  Resp: 16   Exam  General: Well developed, well nourished, NAD, appears stated age  HEENT: NCAT, mucous membranes moist.  Neck: Supple, no JVD, no masses  Cardiovascular: S1 S2 auscultated. Regular rate and rhythm.  Respiratory: Clear to auscultation bilaterally with equal chest rise  Abdomen: Soft, obese, nontender, nondistended, + bowel sounds  Extremities: warm dry without cyanosis clubbing. Bilateral venous stasis in LE. + edema in LE B/L. Dressing in place on RLE and LLE.  Neuro: AAOx3, Decreased  sensation on lower ext bilaterally.  Skin: Without rashes exudates or nodules  Psych: Normal affect and demeanor with intact judgement and insight  Discharge Instructions      Discharge Orders   Future Appointments Provider  Department Dept Phone   10/19/2013 8:30 AM Juan Branch, MD [place] at  [place]   Future Orders Complete By Expires   Diet - low sodium heart healthy  As directed    Discharge instructions  As directed    Comments:     Patient will be discharged home with home health and wound care services. He should continue taking his medications as prescribed. Patient should followup with primary care physician within one week of discharge. He should also have a CBC within one week of discharge.   Face-to-face encounter (required for Medicare/Medicaid patients)  As directed    Comments:     I Juan [name] certify that this patient is under my care and that I, or a nurse practitioner or physician's assistant working with me, had a face-to-face encounter that meets the physician face-to-face encounter requirements with this patient on 07/14/2013. The encounter with the patient was in whole, or in part for the following medical condition(s) which is the primary reason for home health care (List medical condition): Lower extremity cellulitis and wound with pain, which is hindering his ability to move around normally as well as leaving home safely without assistance.. Patient also has a history of diastolic CHF.   Questions:     The encounter with the patient was in whole, or in part, for the following medical condition, which is the primary reason for home health care:  Lower extremity cellulitis with wound   I certify that, based on my findings, the following services are medically necessary home health services:  Nursing   My clinical findings support the need for the above services:  Pain interferes with ambulation/mobility   Further, I certify that my clinical findings support that this patient is homebound due to:  Unable to leave home safely without assistance   Reason for Medically Necessary Home Health Services:  Skilled Nursing- Assessment and Observation of Wound Status    Home Health  As directed    Scheduling Instructions:     Wound Care   Questions:     To provide the following care/treatments:  RN   Increase activity slowly  As directed        Medication List         acetaminophen 500 MG tablet  Commonly known as:  TYLENOL  Take 1,000 mg by mouth every 6 (six) hours as needed for pain.     anagrelide 0.5 MG capsule  Commonly known as:  AGRYLIN  Take 1 capsule (0.5 mg total) by mouth 2 (two) times daily.     aspirin 81 MG tablet  Take 81 mg by mouth daily.     b complex vitamins capsule  Take 1 capsule by mouth daily.     ciprofloxacin 750 MG tablet  Commonly known as:  CIPRO  Take 1 tablet (750 mg total) by mouth every 12 (twelve) hours.     Ferrous Sulfate Dried 200 (65 FE) MG Tabs  Take 1 tablet by mouth daily.     fluticasone 50 MCG/ACT nasal spray  Commonly known as:  FLONASE  Place 2 sprays into both nostrils daily.     folic acid 1 MG tablet  Commonly known as:  FOLVITE  Take 1 tablet (1 mg  total) by mouth daily.     furosemide 40 MG tablet  Commonly known as:  LASIX  Take 80 mg by mouth 2 (two) times daily.     HYDROcodone-acetaminophen 5-325 MG per tablet  Commonly known as:  NORCO/VICODIN  Take 1 tablet by mouth every 4 (four) hours as needed.     lisinopril 20 MG tablet  Commonly known as:  PRINIVIL,ZESTRIL  Take 20 mg by mouth daily.     potassium chloride SA 20 MEQ tablet  Commonly known as:  K-DUR,KLOR-CON  Take 20 mEq by mouth daily.       No Known Allergies Follow-up Information   Follow up with Juan November, MD. Schedule an appointment as soon as possible for a visit in 1 week. [facility] followup)    Specialty:  Internal Medicine   Contact information:   [phone] [address] [facility] [address] [place] [address] [phone]        The results of significant diagnostics from this hospitalization (including imaging, microbiology, ancillary and laboratory) are listed below for reference.     Significant Diagnostic Studies: No results found.  Microbiology: Recent Results (from the past 240 hour(s))  WOUND CULTURE     Status: None   Collection Time    07/11/13  7:38 AM      Result Value Ref Range Status   Specimen Description WOUND LEG RIGHT   Final   Special Requests Normal   Final   Gram Stain     Final   Value: RARE WBC PRESENT, PREDOMINANTLY PMN     RARE SQUAMOUS EPITHELIAL CELLS PRESENT     MODERATE GRAM NEGATIVE RODS     MODERATE GRAM POSITIVE COCCI     IN PAIRS   Culture     Final   Value: ABUNDANT PSEUDOMONAS AERUGINOSA     Performed at [facility]   Report Status PENDING   Incomplete   Organism ID, Bacteria PSEUDOMONAS AERUGINOSA   Final     Labs: Basic Metabolic Panel:  Recent Labs Lab 07/11/13 0602 07/12/13 0512 07/13/13 0415  NA 139 141 140  K 4.5 5.2 4.7  CL 100 105 103  CO2 23 25 23   GLUCOSE 99 119* 97  BUN 31* 29* 30*  CREATININE 1.56* 1.51* 1.37*  CALCIUM 10.1 9.7 9.6   Liver Function Tests: No results found for this basename: AST, ALT, ALKPHOS, BILITOT, PROT, ALBUMIN,  in the last 168 hours No results found for this basename: LIPASE, AMYLASE,  in the last 168 hours No results found for this basename: AMMONIA,  in the last 168 hours CBC:  Recent Labs Lab 07/11/13 0602 07/12/13 0512 07/13/13 0415 07/14/13 0400  WBC 18.2* 15.6* 18.6* 17.7*  NEUTROABS 11.3*  --   --   --   HGB 10.6* 9.7* 10.0* 9.5*  HCT 33.7* 30.7* 30.9* 29.9*  MCV 80.8 80.4 79.6 80.2  PLT 327 307 355 321   Cardiac Enzymes: No results found for this basename: CKTOTAL, CKMB, CKMBINDEX, TROPONINI,  in the last 168 hours BNP: BNP (last 3 results)  Recent Labs  11/30/12 2330 07/11/13 0602  PROBNP 502.6* 306.5*   CBG:  Recent Labs Lab 07/11/13 2043 07/12/13 0734 07/12/13 1158 07/12/13 1650 07/14/13 0728  GLUCAP 111* 80 74 92 93       Signed:  Michaiah Maiden  [facility] 07/14/2013, 9:48 AM

## 2013-07-14 NOTE — Telephone Encounter (Signed)
Unable to connect with pts phone number listed. Spoke with pts daughter to call back to schedule follow- up appointment next week.

## 2013-07-14 NOTE — Discharge Instructions (Signed)
Wound Care Wound care helps prevent pain and infection.  You may need a tetanus shot if:  You cannot remember when you had your last tetanus shot.  You have never had a tetanus shot.  The injury broke your skin. If you need a tetanus shot and you choose not to have one, you may get tetanus. Sickness from tetanus can be serious. HOME CARE   Only take medicine as told by your doctor.  Clean the wound daily with mild soap and water.  Change any bandages (dressings) as told by your doctor.  Put medicated cream and a bandage on the wound as told by your doctor.  Change the bandage if it gets wet, dirty, or starts to smell.  Take showers. Do not take baths, swim, or do anything that puts your wound under water.  Rest and raise (elevate) the wound until the pain and puffiness (swelling) are better.  Keep all doctor visits as told. GET HELP RIGHT AWAY IF:   Yellowish-white fluid (pus) comes from the wound.  Medicine does not lessen your pain.  There is a red streak going away from the wound.  You have a fever. MAKE SURE YOU:   Understand these instructions.  Will watch your condition.  Will get help right away if you are not doing well or get worse. Document Released: 02/11/2008 Document Revised: 07/27/2011 Document Reviewed: 09/07/2010 ExitCare Patient Information 2014 [employer].  

## 2013-07-14 NOTE — Telephone Encounter (Signed)
Patient was recently discharged from the hospital, needs a followup next week. Please arrange

## 2013-07-15 DIAGNOSIS — N400 Enlarged prostate without lower urinary tract symptoms: Secondary | ICD-10-CM | POA: Diagnosis not present

## 2013-07-15 DIAGNOSIS — D649 Anemia, unspecified: Secondary | ICD-10-CM | POA: Diagnosis not present

## 2013-07-15 DIAGNOSIS — Z4800 Encounter for change or removal of nonsurgical wound dressing: Secondary | ICD-10-CM | POA: Diagnosis not present

## 2013-07-15 DIAGNOSIS — I509 Heart failure, unspecified: Secondary | ICD-10-CM | POA: Diagnosis not present

## 2013-07-15 DIAGNOSIS — E119 Type 2 diabetes mellitus without complications: Secondary | ICD-10-CM | POA: Diagnosis not present

## 2013-07-15 DIAGNOSIS — I129 Hypertensive chronic kidney disease with stage 1 through stage 4 chronic kidney disease, or unspecified chronic kidney disease: Secondary | ICD-10-CM | POA: Diagnosis not present

## 2013-07-15 DIAGNOSIS — I2510 Atherosclerotic heart disease of native coronary artery without angina pectoris: Secondary | ICD-10-CM | POA: Diagnosis not present

## 2013-07-15 DIAGNOSIS — I5032 Chronic diastolic (congestive) heart failure: Secondary | ICD-10-CM | POA: Diagnosis not present

## 2013-07-15 DIAGNOSIS — L02419 Cutaneous abscess of limb, unspecified: Secondary | ICD-10-CM | POA: Diagnosis not present

## 2013-07-15 DIAGNOSIS — N189 Chronic kidney disease, unspecified: Secondary | ICD-10-CM | POA: Diagnosis not present

## 2013-07-15 DIAGNOSIS — Z91199 Patient's noncompliance with other medical treatment and regimen due to unspecified reason: Secondary | ICD-10-CM | POA: Diagnosis not present

## 2013-07-15 DIAGNOSIS — Z9119 Patient's noncompliance with other medical treatment and regimen: Secondary | ICD-10-CM | POA: Diagnosis not present

## 2013-07-16 DIAGNOSIS — I129 Hypertensive chronic kidney disease with stage 1 through stage 4 chronic kidney disease, or unspecified chronic kidney disease: Secondary | ICD-10-CM | POA: Diagnosis not present

## 2013-07-16 DIAGNOSIS — L03119 Cellulitis of unspecified part of limb: Secondary | ICD-10-CM | POA: Diagnosis not present

## 2013-07-16 DIAGNOSIS — I5032 Chronic diastolic (congestive) heart failure: Secondary | ICD-10-CM | POA: Diagnosis not present

## 2013-07-16 DIAGNOSIS — N189 Chronic kidney disease, unspecified: Secondary | ICD-10-CM | POA: Diagnosis not present

## 2013-07-16 DIAGNOSIS — E119 Type 2 diabetes mellitus without complications: Secondary | ICD-10-CM | POA: Diagnosis not present

## 2013-07-16 DIAGNOSIS — I509 Heart failure, unspecified: Secondary | ICD-10-CM | POA: Diagnosis not present

## 2013-07-16 DIAGNOSIS — L02419 Cutaneous abscess of limb, unspecified: Secondary | ICD-10-CM | POA: Diagnosis not present

## 2013-07-16 LAB — WOUND CULTURE: SPECIAL REQUESTS: NORMAL

## 2013-07-17 ENCOUNTER — Telehealth: Payer: Self-pay | Admitting: *Deleted

## 2013-07-17 DIAGNOSIS — N189 Chronic kidney disease, unspecified: Secondary | ICD-10-CM | POA: Diagnosis not present

## 2013-07-17 DIAGNOSIS — E119 Type 2 diabetes mellitus without complications: Secondary | ICD-10-CM | POA: Diagnosis not present

## 2013-07-17 DIAGNOSIS — I5032 Chronic diastolic (congestive) heart failure: Secondary | ICD-10-CM | POA: Diagnosis not present

## 2013-07-17 DIAGNOSIS — I509 Heart failure, unspecified: Secondary | ICD-10-CM | POA: Diagnosis not present

## 2013-07-17 DIAGNOSIS — I129 Hypertensive chronic kidney disease with stage 1 through stage 4 chronic kidney disease, or unspecified chronic kidney disease: Secondary | ICD-10-CM | POA: Diagnosis not present

## 2013-07-17 DIAGNOSIS — L02419 Cutaneous abscess of limb, unspecified: Secondary | ICD-10-CM | POA: Diagnosis not present

## 2013-07-17 NOTE — Telephone Encounter (Signed)
Noted, advise patient to take aspirin 81 mg daily

## 2013-07-17 NOTE — Telephone Encounter (Signed)
Megan from [facility] called letting us know that they couldn't refill pts anagrelide (AGRYLIN) 0.5 MG capsule at this time due to stock.

## 2013-07-17 NOTE — Telephone Encounter (Signed)
Pt.notified

## 2013-07-18 DIAGNOSIS — N189 Chronic kidney disease, unspecified: Secondary | ICD-10-CM | POA: Diagnosis not present

## 2013-07-18 DIAGNOSIS — I5032 Chronic diastolic (congestive) heart failure: Secondary | ICD-10-CM | POA: Diagnosis not present

## 2013-07-18 DIAGNOSIS — I509 Heart failure, unspecified: Secondary | ICD-10-CM | POA: Diagnosis not present

## 2013-07-18 DIAGNOSIS — L02419 Cutaneous abscess of limb, unspecified: Secondary | ICD-10-CM | POA: Diagnosis not present

## 2013-07-18 DIAGNOSIS — I129 Hypertensive chronic kidney disease with stage 1 through stage 4 chronic kidney disease, or unspecified chronic kidney disease: Secondary | ICD-10-CM | POA: Diagnosis not present

## 2013-07-18 DIAGNOSIS — E119 Type 2 diabetes mellitus without complications: Secondary | ICD-10-CM | POA: Diagnosis not present

## 2013-07-20 DIAGNOSIS — L02419 Cutaneous abscess of limb, unspecified: Secondary | ICD-10-CM | POA: Diagnosis not present

## 2013-07-20 DIAGNOSIS — I129 Hypertensive chronic kidney disease with stage 1 through stage 4 chronic kidney disease, or unspecified chronic kidney disease: Secondary | ICD-10-CM | POA: Diagnosis not present

## 2013-07-20 DIAGNOSIS — I5032 Chronic diastolic (congestive) heart failure: Secondary | ICD-10-CM | POA: Diagnosis not present

## 2013-07-20 DIAGNOSIS — I509 Heart failure, unspecified: Secondary | ICD-10-CM | POA: Diagnosis not present

## 2013-07-20 DIAGNOSIS — E119 Type 2 diabetes mellitus without complications: Secondary | ICD-10-CM | POA: Diagnosis not present

## 2013-07-20 DIAGNOSIS — N189 Chronic kidney disease, unspecified: Secondary | ICD-10-CM | POA: Diagnosis not present

## 2013-07-23 DIAGNOSIS — I509 Heart failure, unspecified: Secondary | ICD-10-CM | POA: Diagnosis not present

## 2013-07-23 DIAGNOSIS — E119 Type 2 diabetes mellitus without complications: Secondary | ICD-10-CM | POA: Diagnosis not present

## 2013-07-23 DIAGNOSIS — I5032 Chronic diastolic (congestive) heart failure: Secondary | ICD-10-CM | POA: Diagnosis not present

## 2013-07-23 DIAGNOSIS — L02419 Cutaneous abscess of limb, unspecified: Secondary | ICD-10-CM | POA: Diagnosis not present

## 2013-07-23 DIAGNOSIS — I129 Hypertensive chronic kidney disease with stage 1 through stage 4 chronic kidney disease, or unspecified chronic kidney disease: Secondary | ICD-10-CM | POA: Diagnosis not present

## 2013-07-23 DIAGNOSIS — N189 Chronic kidney disease, unspecified: Secondary | ICD-10-CM | POA: Diagnosis not present

## 2013-07-24 ENCOUNTER — Ambulatory Visit (INDEPENDENT_AMBULATORY_CARE_PROVIDER_SITE_OTHER): Payer: Medicare Other | Admitting: Internal Medicine

## 2013-07-24 ENCOUNTER — Encounter: Payer: Self-pay | Admitting: Internal Medicine

## 2013-07-24 VITALS — BP 122/60 | HR 74 | Temp 98.0°F | Wt 276.0 lb

## 2013-07-24 DIAGNOSIS — I2510 Atherosclerotic heart disease of native coronary artery without angina pectoris: Secondary | ICD-10-CM | POA: Diagnosis not present

## 2013-07-24 DIAGNOSIS — L02419 Cutaneous abscess of limb, unspecified: Secondary | ICD-10-CM

## 2013-07-24 DIAGNOSIS — I10 Essential (primary) hypertension: Secondary | ICD-10-CM

## 2013-07-24 DIAGNOSIS — L03119 Cellulitis of unspecified part of limb: Secondary | ICD-10-CM | POA: Diagnosis not present

## 2013-07-24 LAB — BASIC METABOLIC PANEL WITH GFR
BUN: 38 mg/dL — ABNORMAL HIGH (ref 6–23)
CO2: 27 meq/L (ref 19–32)
Calcium: 9.7 mg/dL (ref 8.4–10.5)
Chloride: 107 meq/L (ref 96–112)
Creatinine, Ser: 1.5 mg/dL (ref 0.4–1.5)
GFR: 59.68 mL/min — ABNORMAL LOW
Glucose, Bld: 76 mg/dL (ref 70–99)
Potassium: 4.3 meq/L (ref 3.5–5.1)
Sodium: 140 meq/L (ref 135–145)

## 2013-07-24 LAB — CBC WITH DIFFERENTIAL/PLATELET
BASOS ABS: 0.5 10*3/uL — AB (ref 0.0–0.1)
Basophils Relative: 2.3 % (ref 0.0–3.0)
Eosinophils Absolute: 0.8 10*3/uL — ABNORMAL HIGH (ref 0.0–0.7)
Eosinophils Relative: 3.9 % (ref 0.0–5.0)
HCT: 30.1 % — ABNORMAL LOW (ref 39.0–52.0)
Hemoglobin: 9.4 g/dL — ABNORMAL LOW (ref 13.0–17.0)
LYMPHS PCT: 21.9 % (ref 12.0–46.0)
Lymphs Abs: 4.5 10*3/uL — ABNORMAL HIGH (ref 0.7–4.0)
MCHC: 31.3 g/dL (ref 30.0–36.0)
MCV: 80.2 fl (ref 78.0–100.0)
Monocytes Absolute: 0.7 10*3/uL (ref 0.1–1.0)
Monocytes Relative: 3.5 % (ref 3.0–12.0)
Neutro Abs: 14 10*3/uL — ABNORMAL HIGH (ref 1.4–7.7)
Neutrophils Relative %: 68.4 % (ref 43.0–77.0)
RBC: 3.76 Mil/uL — ABNORMAL LOW (ref 4.22–5.81)
RDW: 20.5 % — AB (ref 11.5–14.6)
WBC: 20.5 10*3/uL (ref 4.5–10.5)

## 2013-07-24 MED ORDER — CIPROFLOXACIN HCL 500 MG PO TABS: 500.0000 mg | ORAL_TABLET | Freq: Two times a day (BID) | ORAL | Status: DC

## 2013-07-24 NOTE — Assessment & Plan Note (Addendum)
Since he left the hospital,cellulitis seems to be improving. Pt requests additional antibiotics. On exam, the wound seems to be very well taken care of by home health. Plan: Continue with wound care per home health team Cipro for 7 days Tylenol as needed for pain Recheck a CBC

## 2013-07-24 NOTE — Assessment & Plan Note (Signed)
BMP at the hospital show a slightly increased creatinine, recheck today

## 2013-07-24 NOTE — Patient Instructions (Signed)
Get your blood work before you leave   Continue Cipro for one week Take Tylenol as needed for pain Continue working with the home health agency to take care of the infection. If you notice the areas of infection  are getting worse--- let me know immediately.  Next visit is for routine check up   in 2 months  No need to come back fasting Please make an appointment

## 2013-07-24 NOTE — Progress Notes (Signed)
Subjective:    Patient ID: Juan [name], male    DOB: [birth year], 73 y.o.   MRN: [id number]  DOS:  07/24/2013 Type of  visit:  Hospital followup Admit date: 07/11/2013  Discharge date: 07/14/2013     History of present illness:  Juan [name] is a 73 y.o. male with past medical history of diastolic CHF and chronic venous stasis who noted over the past few days to have episodes of sharp burning pain in both legs left greater than right plus some minimal drainage from the back of both as well as increased swelling in the past day. Patient had an old antibiotic pills and try to take those, but this did not help. He has had previous cellulitis infections in the past so he came into the emergency room for further evaluation. Lab work noted a white blood cell count of 18, a normal BNP and patient was given a dose of IV clindamycin. Hospitalists were called for further evaluation.  Hospital Course:  Lower extremity cellulitis  -Currently pending wound cultures, gram stain shows moderate GNR and GPC, grew pseudomonas aeruginosa  -Will begin Cipro BID, and patient should continue as an outpatient  -Leukocytosis trending downward  -Continue care with dressing changes and wound care (home health)  -Patient should followup with primary care physician within one week of discharge, to have a CBC as well as evaluation of his wounds.  Anemia  -Currently stable, likely secondary to chronic disease.  -Continue ferrous sulfate  -Patient should have CBC checked within one week of discharge  Acute kidney injury chronic kidney disease  -Creatinine 1.37 (from 1.56 at admission), around his baseline  -May be due to his Lasix, will not discontinue at this time due to patient's history of CHF with lower extremity edema and cellutitis  History of thrombocythemia  -Not relayed to the admitting physician. Patient normally takes Agrylin 0.5mg  BID, however ran out of the prescription  -refilled, patient should follow up with  PCP    ROS Since he left the hospital he states is improving, the home health agency is visiting him every other day to change the wrapping off the infection. No fever, chills? Still has some pain. No discharge  Past Medical History  Diagnosis Date  . CAD (coronary artery disease)     dx elsewheer in past, no documentation. Non-ischemic myovue 2007  . CHF (congestive heart failure)     dx elsewhere, no documentation. Normal EF by previous echo. Trival AS needs f/u ECHO by 2012  . Hypertension   . Sleep apnea, obstructive     at some point used CPAP, was d/c  years ago  . Anemia   . History of thrombocytosis   . Allergic rhinitis   . Edema     R>L leg, u/s 5-12 neg for DVT  . Hemorrhoid   . Type II diabetes mellitus   . Sinus congestion   . Migraine     "once/wk at least" (07/11/2013)    Past Surgical History  Procedure Laterality Date  . Toe surgery Right     "tried to straighten out big toe" (07/11/2013)    History   Social History  . Marital Status: Widowed    Spouse Name: N/A    Number of Children: 2  . Years of Education: N/A   Occupational History  . retired    .     Social History Main Topics  . Smoking status: Former Smoker -- 0.25 packs/day for 12 years  Types: Cigarettes    Quit date: 05/18/1966  . Smokeless tobacco: Never Used  . Alcohol Use: No  . Drug Use: No  . Sexual Activity: Yes   Other Topics Concern  . Not on file   Social History Narrative   Moved from [place] 2006   Widow 2007   Son lives with him         Medication List       This list is accurate as of: 07/24/13  6:25 PM.  Always use your most recent med list.               acetaminophen 500 MG tablet  Commonly known as:  TYLENOL  Take 1,000 mg by mouth every 6 (six) hours as needed for pain.     anagrelide 0.5 MG capsule  Commonly known as:  AGRYLIN  Take 1 capsule (0.5 mg total) by mouth 2 (two) times daily.     aspirin 81 MG tablet  Take 81 mg by mouth daily.       b complex vitamins capsule  Take 1 capsule by mouth daily.     ciprofloxacin 500 MG tablet  Commonly known as:  CIPRO  Take 1 tablet (500 mg total) by mouth 2 (two) times daily.     Ferrous Sulfate Dried 200 (65 FE) MG Tabs  Take 1 tablet by mouth daily.     fluticasone 50 MCG/ACT nasal spray  Commonly known as:  FLONASE  Place 2 sprays into both nostrils daily.     folic acid 1 MG tablet  Commonly known as:  FOLVITE  Take 1 tablet (1 mg total) by mouth daily.     furosemide 40 MG tablet  Commonly known as:  LASIX  Take 80 mg by mouth 2 (two) times daily.     HYDROcodone-acetaminophen 5-325 MG per tablet  Commonly known as:  NORCO/VICODIN  Take 1 tablet by mouth every 4 (four) hours as needed.     lisinopril 20 MG tablet  Commonly known as:  PRINIVIL,ZESTRIL  Take 20 mg by mouth daily.     potassium chloride SA 20 MEQ tablet  Commonly known as:  K-DUR,KLOR-CON  Take 20 mEq by mouth daily.           Objective:   Physical Exam BP 122/60  Pulse 74  Temp(Src) 98 F (36.7 C)  Wt 276 lb (125.193 kg)  SpO2 93% General -- alert, well-developed, NAD.    Lungs -- normal respiratory effort, no intercostal retractions, no accessory muscle use, and normal breath sounds.  Heart-- normal rate, regular rhythm, + II/VI syst  murmur.  Extremities--  Symmetric swelling. Distal  lower extremities are covered w/ a thick cream and wrapped,I was able to palpate and inspect the area, no discharge, odor, abscess or fluctuancy; the areas that the patient points out as the infection seems to be drying up Neurologic--  alert & oriented X3.  sych--   No anxious or depressed appearing.      Assessment & Plan:

## 2013-07-24 NOTE — Progress Notes (Signed)
Pre visit review using our clinic review tool, if applicable. No additional management support is needed unless otherwise documented below in the visit note. 

## 2013-07-25 ENCOUNTER — Telehealth: Payer: Self-pay | Admitting: Internal Medicine

## 2013-07-25 NOTE — Telephone Encounter (Signed)
Relevant patient education mailed to patient.  

## 2013-07-26 ENCOUNTER — Telehealth: Payer: Self-pay | Admitting: *Deleted

## 2013-07-26 DIAGNOSIS — I509 Heart failure, unspecified: Secondary | ICD-10-CM | POA: Diagnosis not present

## 2013-07-26 DIAGNOSIS — E119 Type 2 diabetes mellitus without complications: Secondary | ICD-10-CM | POA: Diagnosis not present

## 2013-07-26 DIAGNOSIS — L02419 Cutaneous abscess of limb, unspecified: Secondary | ICD-10-CM | POA: Diagnosis not present

## 2013-07-26 DIAGNOSIS — I129 Hypertensive chronic kidney disease with stage 1 through stage 4 chronic kidney disease, or unspecified chronic kidney disease: Secondary | ICD-10-CM | POA: Diagnosis not present

## 2013-07-26 DIAGNOSIS — I5032 Chronic diastolic (congestive) heart failure: Secondary | ICD-10-CM | POA: Diagnosis not present

## 2013-07-26 DIAGNOSIS — N189 Chronic kidney disease, unspecified: Secondary | ICD-10-CM | POA: Diagnosis not present

## 2013-07-26 NOTE — Telephone Encounter (Signed)
Allyson Sabal, home health nurse, called concerning patient's wound care orders. She would like a call back at [phone].

## 2013-07-26 NOTE — Telephone Encounter (Signed)
Spoke with home health who states that they are only putting zinc on the wounds and wrapping in gauze. They feel that he is capable of doing this for himself.  Spoke with patient who states that he feels that the antibiotics are not strong enough. That the pain has returned and it is not being relieved with medication. Home health states that the swelling has gone down but the wounds are not closing as of yet. Advised patient to continue taking the antibiotics as prescribed to ensure proper healing. Patient insistent that he needs stronger antibiotics.   Please advise any recommendations.

## 2013-07-27 NOTE — Telephone Encounter (Signed)
Spoke with patient who states that he is going to wait until the home health returns on Wed. If it is not better he will go from there. He has a follow up lab appt on 08/07/2013.He decline the [facility] referral for now. Advised to let us know how he wants to proceed. States that he will.

## 2013-07-27 NOTE — Telephone Encounter (Signed)
Finish current antibiotics. Will need a CBC approximately 08/04/2013--- dxCellulitis Arrange a [facility] referral ---dx cellulitis

## 2013-07-28 ENCOUNTER — Telehealth: Payer: Self-pay

## 2013-07-28 ENCOUNTER — Telehealth: Payer: Self-pay | Admitting: *Deleted

## 2013-07-28 NOTE — Telephone Encounter (Signed)
Received fax from [facility] for home health certification and plan of care for patient on 07/27/2013. Billing sheet attached and forms were reviewed and signed by Dr. Larose Kells. Faxed to [facility] on 07/28/2013. JG//CMA

## 2013-07-28 NOTE — Telephone Encounter (Signed)
Allyson Sabal, home health nurse, called with concerns about patient. She states that he is repeating himself non stop and that he is not making logical decisions. She states that the "cream" that he wants them to put on has no value to help the wounds. He is convinced that it is some miracle in a jar that can fix everything. He will not follow the directions or participate in his care. Questions dementia symptoms? Advised that I spoke with the patient on Thursday 3/12 and that he refused the Wound Care referral stating that he would let us know. The only other person in the home is a  Schizophrenic son. States that there are no other care givers other than home health. Nurse states that she went to the home on Wednesday evening late because he was irate and "screaming" in pain for them to come help him. She feels that there are wounds that need attention but she is very concerned about his mental status as well.  Please advise

## 2013-07-31 NOTE — Telephone Encounter (Signed)
LM on voice mail for Allyson Sabal, RN

## 2013-07-31 NOTE — Telephone Encounter (Signed)
Home health has given him a 48 hours notice of terminating their care. Per Allyson Sabal, RN he refuses to accept that they will not be back out. He has been given appeal paperwork. Plan to follow up with DSS.

## 2013-07-31 NOTE — Telephone Encounter (Signed)
This is a chronic behavior, I don't think he has acute changes. A option would be to call Adult Protective Services, could you call them, see if  they can send a Education officer, museum, he definitely could use some help.

## 2013-07-31 NOTE — Telephone Encounter (Signed)
Juan [name] from [facility] called to update dr Larose Kells on Juan [name]. Please call Juan [name] back at [phone]

## 2013-08-01 DIAGNOSIS — I129 Hypertensive chronic kidney disease with stage 1 through stage 4 chronic kidney disease, or unspecified chronic kidney disease: Secondary | ICD-10-CM | POA: Diagnosis not present

## 2013-08-01 DIAGNOSIS — N189 Chronic kidney disease, unspecified: Secondary | ICD-10-CM | POA: Diagnosis not present

## 2013-08-01 DIAGNOSIS — I5032 Chronic diastolic (congestive) heart failure: Secondary | ICD-10-CM | POA: Diagnosis not present

## 2013-08-01 DIAGNOSIS — I509 Heart failure, unspecified: Secondary | ICD-10-CM | POA: Diagnosis not present

## 2013-08-01 DIAGNOSIS — E119 Type 2 diabetes mellitus without complications: Secondary | ICD-10-CM | POA: Diagnosis not present

## 2013-08-01 DIAGNOSIS — L02419 Cutaneous abscess of limb, unspecified: Secondary | ICD-10-CM | POA: Diagnosis not present

## 2013-08-02 DIAGNOSIS — I509 Heart failure, unspecified: Secondary | ICD-10-CM | POA: Diagnosis not present

## 2013-08-02 DIAGNOSIS — I5032 Chronic diastolic (congestive) heart failure: Secondary | ICD-10-CM | POA: Diagnosis not present

## 2013-08-02 DIAGNOSIS — E119 Type 2 diabetes mellitus without complications: Secondary | ICD-10-CM | POA: Diagnosis not present

## 2013-08-02 DIAGNOSIS — L02419 Cutaneous abscess of limb, unspecified: Secondary | ICD-10-CM | POA: Diagnosis not present

## 2013-08-02 DIAGNOSIS — L03119 Cellulitis of unspecified part of limb: Secondary | ICD-10-CM

## 2013-08-04 NOTE — Telephone Encounter (Signed)
Per Dr Larose Kells, try to get patient help through a social worker (DSS). No information on help at this point.

## 2013-08-07 ENCOUNTER — Telehealth: Payer: Self-pay | Admitting: *Deleted

## 2013-08-07 ENCOUNTER — Other Ambulatory Visit (INDEPENDENT_AMBULATORY_CARE_PROVIDER_SITE_OTHER): Payer: Medicare Other

## 2013-08-07 DIAGNOSIS — D72829 Elevated white blood cell count, unspecified: Secondary | ICD-10-CM

## 2013-08-07 LAB — CBC WITH DIFFERENTIAL/PLATELET
BASOS PCT: 0 % (ref 0.0–3.0)
Basophils Absolute: 0 10*3/uL (ref 0.0–0.1)
EOS ABS: 0.6 10*3/uL (ref 0.0–0.7)
Eosinophils Relative: 3.9 % (ref 0.0–5.0)
HCT: 30.3 % — ABNORMAL LOW (ref 39.0–52.0)
HEMOGLOBIN: 9.9 g/dL — AB (ref 13.0–17.0)
Lymphocytes Relative: 23.4 % (ref 12.0–46.0)
Lymphs Abs: 3.5 10*3/uL (ref 0.7–4.0)
MCHC: 32.5 g/dL (ref 30.0–36.0)
MCV: 78.7 fl (ref 78.0–100.0)
Monocytes Absolute: 2 10*3/uL — ABNORMAL HIGH (ref 0.1–1.0)
Monocytes Relative: 13.3 % — ABNORMAL HIGH (ref 3.0–12.0)
Neutro Abs: 8.8 10*3/uL — ABNORMAL HIGH (ref 1.4–7.7)
Neutrophils Relative %: 59.4 % (ref 43.0–77.0)
Platelets: 370 10*3/uL (ref 150.0–400.0)
RBC: 3.86 Mil/uL — ABNORMAL LOW (ref 4.22–5.81)
RDW: 20.4 % — ABNORMAL HIGH (ref 11.5–14.6)
WBC: 14.9 10*3/uL — ABNORMAL HIGH (ref 4.5–10.5)

## 2013-08-07 MED ORDER — HYDROCODONE-ACETAMINOPHEN 5-325 MG PO TABS: 1.0000 | ORAL_TABLET | ORAL | Status: DC | PRN

## 2013-08-07 NOTE — Telephone Encounter (Signed)
Patient was in the office for lab work and requested "the nurse come and look at my leg." I looked at the dressing that was applied by [facility] nurse. Patient stated "they told me they were not coming back so I need you to look at my leg." I advised that patient that I could look at his leg but that he would need an appt with Dr. Larose Kells if it looked infected. I removed the dressing and foam bandage that had a small area stained with pruluent yellow drainage. The wound was cleansed with normal saline, triple antibiotic ointment applied and area was covered with telfa pads, secured with kerlex and loosely wrapped with an elastic bandage. Patient encouraged to clean the area daily and keep covered with non stick guaze. Patient then requested a refill on abx and pain pills. I advised that note would be sent to Dr. Larose Kells and we could call him and let him know if this is approved.

## 2013-08-07 NOTE — Telephone Encounter (Signed)
Patient is requesting a refill on hydrocodone. Last office visit 07/24/13 Last filled 06/30/13 #20 UDS, low risk on 06/30/2013 Okay to refill?

## 2013-08-07 NOTE — Telephone Encounter (Signed)
Attempts to contact DSS not productive. Patient presents to the office for labs. Is put on schedule to see MD 08/16/2013.

## 2013-08-07 NOTE — Telephone Encounter (Signed)
Done

## 2013-08-08 NOTE — Telephone Encounter (Signed)
Spoke with patient and made aware that Rx for hydrocodone was ready to be picked up.

## 2013-08-16 ENCOUNTER — Encounter: Payer: Self-pay | Admitting: Internal Medicine

## 2013-08-16 ENCOUNTER — Ambulatory Visit (INDEPENDENT_AMBULATORY_CARE_PROVIDER_SITE_OTHER): Payer: Medicare Other | Admitting: Internal Medicine

## 2013-08-16 ENCOUNTER — Other Ambulatory Visit: Payer: Self-pay | Admitting: Internal Medicine

## 2013-08-16 VITALS — BP 126/71 | HR 70 | Temp 98.1°F | Wt 278.0 lb

## 2013-08-16 DIAGNOSIS — I2510 Atherosclerotic heart disease of native coronary artery without angina pectoris: Secondary | ICD-10-CM | POA: Diagnosis not present

## 2013-08-16 DIAGNOSIS — L02419 Cutaneous abscess of limb, unspecified: Secondary | ICD-10-CM

## 2013-08-16 DIAGNOSIS — L03119 Cellulitis of unspecified part of limb: Principal | ICD-10-CM

## 2013-08-16 MED ORDER — CIPROFLOXACIN HCL 500 MG PO TABS: 500.0000 mg | ORAL_TABLET | Freq: Two times a day (BID) | ORAL | Status: DC

## 2013-08-16 NOTE — Progress Notes (Signed)
Subjective:    Patient ID: Juan [name], male    DOB: [birth year], 73 y.o.   MRN: [id number]  DOS:  08/16/2013 Type of  visit: Followup from previous visit   Was seen with cellulitis, status post antibiotics, status post wound care by the home health agency. Overall feels a little better, strongly request of antibiotics.  ROS Subjective fever sometimes? Denies chest pain, difficulty breathing at baseline. Edema slightly better.   Past Medical History  Diagnosis Date  . CAD (coronary artery disease)     dx elsewheer in past, no documentation. Non-ischemic myovue 2007  . CHF (congestive heart failure)     dx elsewhere, no documentation. Normal EF by previous echo. Trival AS needs f/u ECHO by 2012  . Hypertension   . Sleep apnea, obstructive     at some point used CPAP, was d/c  years ago  . Anemia   . History of thrombocytosis   . Allergic rhinitis   . Edema     R>L leg, u/s 5-12 neg for DVT  . Hemorrhoid   . Type II diabetes mellitus   . Sinus congestion   . Migraine     "once/wk at least" (07/11/2013)    Past Surgical History  Procedure Laterality Date  . Toe surgery Right     "tried to straighten out big toe" (07/11/2013)    History   Social History  . Marital Status: Widowed    Spouse Name: N/A    Number of Children: 2  . Years of Education: N/A   Occupational History  . retired    .     Social History Main Topics  . Smoking status: Former Smoker -- 0.25 packs/day for 12 years    Types: Cigarettes    Quit date: 05/18/1966  . Smokeless tobacco: Never Used  . Alcohol Use: No  . Drug Use: No  . Sexual Activity: Yes   Other Topics Concern  . Not on file   Social History Narrative   Moved from [place] 2006   Widow 2007   Son lives with him         Medication List       This list is accurate as of: 08/16/13  6:08 PM.  Always use your most recent med list.               acetaminophen 500 MG tablet  Commonly known as:  TYLENOL  Take 1,000 mg by mouth  every 6 (six) hours as needed for pain.     anagrelide 0.5 MG capsule  Commonly known as:  AGRYLIN  Take 1 capsule (0.5 mg total) by mouth 2 (two) times daily.     aspirin 81 MG tablet  Take 81 mg by mouth daily.     b complex vitamins capsule  Take 1 capsule by mouth daily.     ciprofloxacin 500 MG tablet  Commonly known as:  CIPRO  Take 1 tablet (500 mg total) by mouth 2 (two) times daily.     Ferrous Sulfate Dried 200 (65 FE) MG Tabs  Take 1 tablet by mouth daily.     fluticasone 50 MCG/ACT nasal spray  Commonly known as:  FLONASE  Place 2 sprays into both nostrils daily.     folic acid 1 MG tablet  Commonly known as:  FOLVITE  Take 1 tablet (1 mg total) by mouth daily.     furosemide 40 MG tablet  Commonly known as:  LASIX  TAKE 3  TABLETS BY MOUTH DAILY     HYDROcodone-acetaminophen 5-325 MG per tablet  Commonly known as:  NORCO/VICODIN  Take 1 tablet by mouth every 4 (four) hours as needed.     lisinopril 20 MG tablet  Commonly known as:  PRINIVIL,ZESTRIL  Take 20 mg by mouth daily.     potassium chloride SA 20 MEQ tablet  Commonly known as:  K-DUR,KLOR-CON  TAKE 1 TABLET BY MOUTH DAILY           Objective:   Physical Exam  Skin:      BP 126/71  Pulse 70  Temp(Src) 98.1 F (36.7 C)  Wt 278 lb (126.1 kg)  SpO2 93% General -- alert, well-developed, NAD.    Extremities--  Wrap removed. distal pretibial skin bilaterally dry, crackly. No discharge. Calves are slightly swollen, + pitting edema  , slt warm Neurologic--  alert & oriented X3. Speech normal, gait - strength at baseline          Assessment & Plan:

## 2013-08-16 NOTE — Progress Notes (Signed)
Pre visit review using our clinic review tool, if applicable. No additional management support is needed unless otherwise documented below in the visit note. 

## 2013-08-16 NOTE — Patient Instructions (Signed)
Continue taking the same medications  Cipro twice a day for one additional week.  Keep the legs elevated at least one hour 3 times a day   Okay to take a shower and use soap every day, dry the area carefully  Change dressings daily  Go to the ER if you to have more swelling, fever, chills, discharge from the wound  Next visit here in 3 weeks

## 2013-08-16 NOTE — Assessment & Plan Note (Addendum)
Status post wound care by home health agency, declined before and today a referral to the wound care center. Status post antibiotics. The wound at the left leg seems to be okay, calves w/ Superficial pitting edema, I suspect dependeant. Plan: Continue with Lasix and leg elevation Pt strongly requests another round of antibiotics, there is some warmness in the skin on the calves, Cipro for one week. Followup in 3 weeks. Continue with self wound care ER if worse. See instructions

## 2013-08-21 ENCOUNTER — Encounter: Payer: Self-pay | Admitting: Internal Medicine

## 2013-09-07 ENCOUNTER — Ambulatory Visit: Payer: Medicare Other | Admitting: Internal Medicine

## 2013-09-08 ENCOUNTER — Ambulatory Visit: Payer: Medicare Other

## 2013-09-08 ENCOUNTER — Other Ambulatory Visit (HOSPITAL_BASED_OUTPATIENT_CLINIC_OR_DEPARTMENT_OTHER): Payer: Medicare Other

## 2013-09-08 ENCOUNTER — Other Ambulatory Visit: Payer: Self-pay | Admitting: Internal Medicine

## 2013-09-08 ENCOUNTER — Telehealth: Payer: Self-pay | Admitting: Internal Medicine

## 2013-09-08 ENCOUNTER — Encounter: Payer: Self-pay | Admitting: Internal Medicine

## 2013-09-08 ENCOUNTER — Ambulatory Visit (INDEPENDENT_AMBULATORY_CARE_PROVIDER_SITE_OTHER): Payer: Medicare Other | Admitting: Internal Medicine

## 2013-09-08 VITALS — BP 128/71 | HR 70 | Temp 97.9°F | Wt 270.0 lb

## 2013-09-08 DIAGNOSIS — R609 Edema, unspecified: Secondary | ICD-10-CM

## 2013-09-08 DIAGNOSIS — I10 Essential (primary) hypertension: Secondary | ICD-10-CM | POA: Diagnosis not present

## 2013-09-08 DIAGNOSIS — Z9119 Patient's noncompliance with other medical treatment and regimen: Secondary | ICD-10-CM | POA: Diagnosis not present

## 2013-09-08 DIAGNOSIS — Z91199 Patient's noncompliance with other medical treatment and regimen due to unspecified reason: Secondary | ICD-10-CM

## 2013-09-08 DIAGNOSIS — I2510 Atherosclerotic heart disease of native coronary artery without angina pectoris: Secondary | ICD-10-CM | POA: Diagnosis not present

## 2013-09-08 LAB — CBC WITH DIFFERENTIAL/PLATELET
BASOS ABS: 0.5 10*3/uL — AB (ref 0.0–0.1)
Basophils Relative: 2.7 % (ref 0.0–3.0)
EOS ABS: 0.5 10*3/uL (ref 0.0–0.7)
Eosinophils Relative: 2.8 % (ref 0.0–5.0)
HCT: 30.9 % — ABNORMAL LOW (ref 39.0–52.0)
Hemoglobin: 9.8 g/dL — ABNORMAL LOW (ref 13.0–17.0)
LYMPHS ABS: 4.5 10*3/uL — AB (ref 0.7–4.0)
Lymphocytes Relative: 25.8 % (ref 12.0–46.0)
MCHC: 31.8 g/dL (ref 30.0–36.0)
MCV: 79.7 fl (ref 78.0–100.0)
MONO ABS: 2.1 10*3/uL — AB (ref 0.1–1.0)
Monocytes Relative: 12 % (ref 3.0–12.0)
NEUTROS PCT: 56.7 % (ref 43.0–77.0)
Neutro Abs: 10 10*3/uL — ABNORMAL HIGH (ref 1.4–7.7)
Platelets: 276 10*3/uL (ref 150.0–400.0)
RBC: 3.88 Mil/uL — ABNORMAL LOW (ref 4.22–5.81)
RDW: 20.9 % — AB (ref 11.5–14.6)

## 2013-09-08 LAB — BASIC METABOLIC PANEL
BUN: 36 mg/dL — ABNORMAL HIGH (ref 6–23)
CO2: 28 meq/L (ref 19–32)
CREATININE: 1.3 mg/dL (ref 0.4–1.5)
Calcium: 9.9 mg/dL (ref 8.4–10.5)
Chloride: 106 mEq/L (ref 96–112)
GFR: 71.09 mL/min (ref >60.00)
Glucose, Bld: 86 mg/dL (ref 70–99)
Potassium: 4.4 mEq/L (ref 3.5–5.1)
Sodium: 139 mEq/L (ref 135–145)

## 2013-09-08 NOTE — Assessment & Plan Note (Signed)
Stable, check a BMP

## 2013-09-08 NOTE — Progress Notes (Signed)
Patient refuses to go for [place] today stating that he is too weak to go. Advised that he could be putting his life at risk. Patient states that he does not care that wants to get the test in 2 weeks. Protocols reviewed for PE and advised to go to ED. Will refer for 2 weeks per MD.

## 2013-09-08 NOTE — Progress Notes (Signed)
Pre visit review using our clinic review tool, if applicable. No additional management support is needed unless otherwise documented below in the visit note. 

## 2013-09-08 NOTE — Assessment & Plan Note (Addendum)
Definitely improving. Recommend continue with diuretics, recheck a CBC which was elevated d/t recent cellulitis. Continue with leg elevation. Check a ultrasound to rule out left calf DVT which  is larger than the right. Patient quite adamant about getting antibiotics,  I don't believe there is an active infection consequently they're not indicated at this point. (CBC pending). Addendum-- pt strongly declined to have a u/s despite our repeated advise

## 2013-09-08 NOTE — Progress Notes (Signed)
Subjective:    Patient ID: Juan [name], male    DOB: 03-Jun-1940, 73 y.o.   MRN: [id number]  DOS:  09/08/2013 Type of  visit: followup from previous visit Reports good compliance with medication. Lower extremity pain has decrease.  ROS Occasional chills, no documented fever. No chest pain, breathing at baseline   Past Medical History  Diagnosis Date  . CAD (coronary artery disease)     dx elsewheer in past, no documentation. Non-ischemic myovue 2007  . CHF (congestive heart failure)     dx elsewhere, no documentation. Normal EF by previous echo. Trival AS needs f/u ECHO by 2012  . Hypertension   . Sleep apnea, obstructive     at some point used CPAP, was d/c  years ago  . Anemia   . History of thrombocytosis   . Allergic rhinitis   . Edema     R>L leg, u/s 5-12 neg for DVT  . Hemorrhoid   . Type II diabetes mellitus   . Sinus congestion   . Migraine     "once/wk at least" (07/11/2013)    Past Surgical History  Procedure Laterality Date  . Toe surgery Right     "tried to straighten out big toe" (07/11/2013)    History   Social History  . Marital Status: Widowed    Spouse Name: N/A    Number of Children: 2  . Years of Education: N/A   Occupational History  . retired    .     Social History Main Topics  . Smoking status: Former Smoker -- 0.25 packs/day for 12 years    Types: Cigarettes    Quit date: 05/18/1966  . Smokeless tobacco: Never Used  . Alcohol Use: No  . Drug Use: No  . Sexual Activity: Yes   Other Topics Concern  . Not on file   Social History Narrative   Moved from [place] 2006   Widow 2007   Son lives with him         Medication List       This list is accurate as of: 09/08/13 11:59 PM.  Always use your most recent med list.               acetaminophen 500 MG tablet  Commonly known as:  TYLENOL  Take 1,000 mg by mouth every 6 (six) hours as needed for pain.     anagrelide 0.5 MG capsule  Commonly known as:  AGRYLIN  Take 1  capsule (0.5 mg total) by mouth 2 (two) times daily.     b complex vitamins capsule  Take 1 capsule by mouth daily.     Ferrous Sulfate Dried 200 (65 FE) MG Tabs  Take 1 tablet by mouth daily.     fluticasone 50 MCG/ACT nasal spray  Commonly known as:  FLONASE  Place 2 sprays into both nostrils daily.     folic acid 1 MG tablet  Commonly known as:  FOLVITE  Take 1 tablet (1 mg total) by mouth daily.     furosemide 40 MG tablet  Commonly known as:  LASIX  TAKE 3 TABLETS BY MOUTH DAILY     HYDROcodone-acetaminophen 5-325 MG per tablet  Commonly known as:  NORCO/VICODIN  Take 1 tablet by mouth every 4 (four) hours as needed.     lisinopril 20 MG tablet  Commonly known as:  PRINIVIL,ZESTRIL  Take 20 mg by mouth daily.     potassium chloride SA 20 MEQ tablet  Commonly known as:  K-DUR,KLOR-CON  TAKE 1 TABLET BY MOUTH DAILY           Objective:   Physical Exam BP 128/71  Pulse 70  Temp(Src) 97.9 F (36.6 C)  Wt 270 lb (122.471 kg)  SpO2 98%  General -- alert, well-developed, NAD.   Extremities--  No pitting edema Distal peritibial skin: dry, no red-warm, no d/c. Previously seen ulcers almost completely heal Calves: L larger than R by 1 inch  Neurologic--  alert & oriented X3  Psych--  No anxious or depressed appearing.       Assessment & Plan:    Today , I spent more than 25 min with the patient, >50% of the time counseling (leg elevation, no need of abx)

## 2013-09-08 NOTE — Telephone Encounter (Signed)
As noted in [place] for today, patient refuses to have the recommended [place]. Advised patient of need for test and the risk for not determining the cause of his lower limb pain and swelling. Advised patient of PE protocol and referred to ED. Patient states that he wants to get it in 2 weeks. Advised MD. Referral order entered.

## 2013-09-08 NOTE — Telephone Encounter (Signed)
Per Dr. Larose Kells, patient was to be scheduled for venous US today. I scheduled appt for pt at 11:30am today, however patient refused to go have [place]. Patient states he is too sick to go and that his legs are hurting too badly. Patient also states he does not want to go today because of financial reasons. I explained to the patient that Dr. Larose Kells wanted him to go today for the swelling and pain in his leg. Patient still refused to go today. He is requesting an appointment in 2 weeks on a Monday or Tuesday in the morning. Informed Gaye of the situation and she spoke w/the patient in more detail about the risks of not going for this ultrasound.

## 2013-09-08 NOTE — Patient Instructions (Signed)
Get your blood work before you leave   Get the ultrasound  Keep legs elevated as before   Next visit is for a physical exam in 2 months ,  fasting Please make an appointment

## 2013-09-09 NOTE — Assessment & Plan Note (Signed)
See comments under edema 

## 2013-09-11 ENCOUNTER — Telehealth: Payer: Self-pay | Admitting: *Deleted

## 2013-09-11 DIAGNOSIS — D72829 Elevated white blood cell count, unspecified: Secondary | ICD-10-CM

## 2013-09-11 LAB — PATHOLOGIST SMEAR REVIEW

## 2013-09-11 NOTE — Telephone Encounter (Signed)
Message copied by Harl Bowie on Mon Sep 11, 2013 11:55 AM ------      Message from: [name] E      Created: Sun Sep 10, 2013  5:42 PM       WBCs continued to be elevated despite lack of evidence for an infection.      Advise patient: Needs to be seen by hematology, please arrange a referral DX chronic leukocytosis ------

## 2013-09-11 NOTE — Telephone Encounter (Signed)
Informed the pt of recent lab results and note.  Pt understood and agreed.  New referral ordered and sent.//AB/CMA

## 2013-09-20 ENCOUNTER — Other Ambulatory Visit: Payer: Self-pay | Admitting: Internal Medicine

## 2013-09-22 ENCOUNTER — Telehealth: Payer: Self-pay | Admitting: Internal Medicine

## 2013-09-22 NOTE — Telephone Encounter (Signed)
Patient was instructed to have venous US on his L leg on 09/08/13, but refused to go (see phone note dated 09/08/13). He stated that he would go in 2 weeks. Please advise if this ultrasound is still recommended or if it is irrelevant now.

## 2013-09-22 NOTE — Telephone Encounter (Signed)
Do you still want to have this test performed?  Please advise

## 2013-09-22 NOTE — Telephone Encounter (Signed)
Yes if patient willing to go

## 2013-09-22 NOTE — Telephone Encounter (Signed)
See below

## 2013-09-26 NOTE — Telephone Encounter (Signed)
Attempted to call patient to set up ultrasound. No vm set up at this time. I will try again tomorrow.

## 2013-09-28 ENCOUNTER — Ambulatory Visit: Payer: Medicare Other | Admitting: Hematology and Oncology

## 2013-09-28 ENCOUNTER — Ambulatory Visit: Payer: Medicare Other

## 2013-09-28 ENCOUNTER — Ambulatory Visit: Payer: Medicare Other | Admitting: Internal Medicine

## 2013-10-03 ENCOUNTER — Ambulatory Visit (HOSPITAL_BASED_OUTPATIENT_CLINIC_OR_DEPARTMENT_OTHER): Payer: Medicare Other | Admitting: Hematology and Oncology

## 2013-10-03 ENCOUNTER — Ambulatory Visit: Payer: Medicare Other

## 2013-10-03 ENCOUNTER — Encounter: Payer: Self-pay | Admitting: Hematology and Oncology

## 2013-10-03 ENCOUNTER — Ambulatory Visit (HOSPITAL_COMMUNITY)
Admission: RE | Admit: 2013-10-03 | Discharge: 2013-10-03 | Disposition: A | Discharge status: Home / Self Care | Payer: Medicare Other | Source: Ambulatory Visit | Attending: Hematology and Oncology | Admitting: Hematology and Oncology

## 2013-10-03 ENCOUNTER — Ambulatory Visit (HOSPITAL_BASED_OUTPATIENT_CLINIC_OR_DEPARTMENT_OTHER): Payer: Medicare Other

## 2013-10-03 ENCOUNTER — Telehealth: Payer: Self-pay | Admitting: Hematology and Oncology

## 2013-10-03 VITALS — BP 145/59 | HR 66 | Temp 97.6°F | Resp 20 | Ht 75.0 in | Wt 270.0 lb

## 2013-10-03 DIAGNOSIS — J9819 Other pulmonary collapse: Secondary | ICD-10-CM | POA: Diagnosis not present

## 2013-10-03 DIAGNOSIS — L89899 Pressure ulcer of other site, unspecified stage: Secondary | ICD-10-CM | POA: Diagnosis not present

## 2013-10-03 DIAGNOSIS — L89309 Pressure ulcer of unspecified buttock, unspecified stage: Secondary | ICD-10-CM | POA: Diagnosis not present

## 2013-10-03 DIAGNOSIS — R05 Cough: Secondary | ICD-10-CM | POA: Diagnosis not present

## 2013-10-03 DIAGNOSIS — D759 Disease of blood and blood-forming organs, unspecified: Secondary | ICD-10-CM

## 2013-10-03 DIAGNOSIS — I872 Venous insufficiency (chronic) (peripheral): Secondary | ICD-10-CM

## 2013-10-03 DIAGNOSIS — D72829 Elevated white blood cell count, unspecified: Secondary | ICD-10-CM

## 2013-10-03 DIAGNOSIS — R059 Cough, unspecified: Secondary | ICD-10-CM

## 2013-10-03 DIAGNOSIS — D473 Essential (hemorrhagic) thrombocythemia: Secondary | ICD-10-CM | POA: Diagnosis not present

## 2013-10-03 DIAGNOSIS — D539 Nutritional anemia, unspecified: Secondary | ICD-10-CM

## 2013-10-03 DIAGNOSIS — C9200 Acute myeloblastic leukemia, not having achieved remission: Secondary | ICD-10-CM | POA: Diagnosis not present

## 2013-10-03 LAB — CBC WITH DIFFERENTIAL/PLATELET
HCT: 31.1 % — ABNORMAL LOW (ref 38.4–49.9)
HGB: 9.8 g/dL — ABNORMAL LOW (ref 13.0–17.1)
MCH: 25.2 pg — ABNORMAL LOW (ref 27.2–33.4)
MCHC: 31.5 g/dL — ABNORMAL LOW (ref 32.0–36.0)
MCV: 80 fL (ref 79.3–98.0)
Platelets: 249 10*3/uL (ref 140–400)
RBC: 3.88 10*6/uL — AB (ref 4.20–5.82)
RDW: 20.8 % — ABNORMAL HIGH (ref 11.0–14.6)
WBC: 18.5 10*3/uL — ABNORMAL HIGH (ref 4.0–10.3)

## 2013-10-03 LAB — MANUAL DIFFERENTIAL
ALC: 2.6 10*3/uL (ref 0.9–3.3)
ANC (CHCC MAN DIFF): 10.9 10*3/uL — AB (ref 1.5–6.5)
BAND NEUTROPHILS: 16 % — AB (ref 0–10)
BLASTS: 4 % — AB (ref 0–0)
Basophil: 9 % — ABNORMAL HIGH (ref 0–2)
EOS: 3 % (ref 0–7)
LYMPH: 14 % (ref 14–49)
MONO: 10 % (ref 0–14)
Metamyelocytes: 4 % — ABNORMAL HIGH (ref 0–0)
Myelocytes: 5 % — ABNORMAL HIGH (ref 0–0)
NRBC: 5 % — AB (ref 0–0)
OTHER CELL: 0 % (ref 0–0)
PLT EST: ADEQUATE
PROMYELO: 1 % — ABNORMAL HIGH (ref 0–0)
SEG: 34 % — AB (ref 38–77)
Variant Lymph: 0 % (ref 0–0)

## 2013-10-03 LAB — SEDIMENTATION RATE: Sed Rate: 28 mm/hr — ABNORMAL HIGH (ref 0–16)

## 2013-10-03 LAB — IRON AND TIBC CHCC
%SAT: 19 % — AB (ref 20–55)
Iron: 44 ug/dL (ref 42–163)
TIBC: 230 ug/dL (ref 202–409)
UIBC: 186 ug/dL (ref 117–376)

## 2013-10-03 LAB — CHCC SMEAR

## 2013-10-03 LAB — FERRITIN CHCC: Ferritin: 377 ng/ml — ABNORMAL HIGH (ref 22–316)

## 2013-10-03 NOTE — Progress Notes (Signed)
Checked in new pt with no financial concerns. °

## 2013-10-03 NOTE — Progress Notes (Signed)
[place] NOTE  Patient Care Team: Colon Branch, MD as PCP - General  CHIEF COMPLAINTS/PURPOSE OF CONSULTATION:  Chronic leukocytosis  HISTORY OF PRESENTING ILLNESS:  Juan [name] 73 y.o. male is here because of elevated WBC.  He was found to have abnormal CBC from routine blood tests. I had the opportunity to review his CBC from 2007. He was noted to have recurrent leukocytosis since December of 2013. In July 2014, his white blood cell count went back to normal. Setting around August 2014, it became chronically elevated ranging from 12.4 to as high as 20.5. The patient also have recurrent thrombocytosis. In 2007, his blood count was as high as 669,000. Since then, it has fluctuated between normal range to 472,000. He was also noted to be chronically anemic since 2007. He is currently on oral iron supplements. The patient have pressure sores and wounds in his lower extremities. Recently, he felt a new pressure sore on his buttock. He also chronic productive cough with white phlegm. There is not reported symptoms of sinus congestion urinary frequency/urgency or dysuria, diarrhea or joint swelling/pain.  The patient has no prior diagnosis of autoimmune disease and was not prescribed corticosteroids related products. The patient is not a smoker.  MEDICAL HISTORY:  Past Medical History  Diagnosis Date  . CAD (coronary artery disease)     dx elsewheer in past, no documentation. Non-ischemic myovue 2007  . CHF (congestive heart failure)     dx elsewhere, no documentation. Normal EF by previous echo. Trival AS needs f/u ECHO by 2012  . Hypertension   . Sleep apnea, obstructive     at some point used CPAP, was d/c  years ago  . Anemia   . History of thrombocytosis   . Allergic rhinitis   . Edema     R>L leg, u/s 5-12 neg for DVT  . Hemorrhoid   . Type II diabetes mellitus   . Sinus congestion   . Migraine     "once/wk at least" (07/11/2013)    SURGICAL  HISTORY: Past Surgical History  Procedure Laterality Date  . Toe surgery Right     "tried to straighten out big toe" (07/11/2013)    SOCIAL HISTORY: History   Social History  . Marital Status: Widowed    Spouse Name: N/A    Number of Children: 2  . Years of Education: N/A   Occupational History  . retired    .     Social History Main Topics  . Smoking status: Former Smoker -- 0.25 packs/day for 12 years    Types: Cigarettes    Quit date: 05/18/1966  . Smokeless tobacco: Never Used  . Alcohol Use: No  . Drug Use: No  . Sexual Activity: Yes   Other Topics Concern  . Not on file   Social History Narrative   Moved from [place] 2006   Widow 2007   Son lives with him     FAMILY HISTORY: Family History  Problem Relation Age of Onset  . Colon cancer Neg Hx   . Prostate cancer Neg Hx   . Heart attack Neg Hx   . Diabetes Neg Hx   . Schizophrenia Son     ALLERGIES:  has No Known Allergies.  MEDICATIONS:  Current Outpatient Prescriptions  Medication Sig Dispense Refill  . anagrelide (AGRYLIN) 0.5 MG capsule TAKE ONE CAPSULE BY MOUTH TWICE DAILY  60 capsule  2  . b complex vitamins capsule Take 1 capsule  by mouth daily.  30 capsule  3  . Ferrous Sulfate Dried 200 (65 FE) MG TABS Take 1 tablet by mouth daily.       . fluticasone (FLONASE) 50 MCG/ACT nasal spray Place 2 sprays into both nostrils daily.       . folic acid (FOLVITE) 1 MG tablet Take 1 tablet (1 mg total) by mouth daily.  30 tablet  3  . furosemide (LASIX) 40 MG tablet TAKE 3 TABLETS BY MOUTH DAILY  90 tablet  3  . lisinopril (PRINIVIL,ZESTRIL) 20 MG tablet Take 20 mg by mouth daily.      . potassium chloride SA (K-DUR,KLOR-CON) 20 MEQ tablet TAKE 1 TABLET BY MOUTH DAILY  30 tablet  3  . acetaminophen (TYLENOL) 500 MG tablet Take 1,000 mg by mouth every 6 (six) hours as needed for pain.      Marland Kitchen HYDROcodone-acetaminophen (NORCO/VICODIN) 5-325 MG per tablet Take 1 tablet by mouth every 4 (four) hours as needed.  20  tablet  0   No current facility-administered medications for this visit.    REVIEW OF SYSTEMS:   Constitutional: Denies fevers, chills or abnormal night sweats Eyes: Denies blurriness of vision, double vision or watery eyes Ears, nose, mouth, throat, and face: Denies mucositis or sore throat Cardiovascular: Denies palpitation or chest discomfort. He complained of chronic bilateral lower extremity swelling Gastrointestinal:  Denies nausea, heartburn or change in bowel habits Lymphatics: Denies new lymphadenopathy or easy bruising Neurological:Denies numbness, tingling or new weaknesses Behavioral/Psych: Mood is stable, no new changes  All other systems were reviewed with the patient and are negative.  PHYSICAL EXAMINATION: ECOG PERFORMANCE STATUS: 1 - Symptomatic but completely ambulatory  Filed Vitals:   10/03/13 0848  BP: 145/59  Pulse: 66  Temp: 97.6 F (36.4 C)  Resp: 20   Filed Weights   10/03/13 0848  Weight: 270 lb (122.471 kg)    GENERAL:alert, no distress and comfortable. He is morbidly obese SKIN: Noted a small pressure sore in his right buttock, area, grade I with no ulceration. EYES: normal, conjunctiva are pink and non-injected, sclera clear OROPHARYNX:no exudate, no erythema and lips, buccal mucosa, and tongue normal . Very poor dentition is noted NECK: supple, thyroid normal size, non-tender, without nodularity LYMPH:  no palpable lymphadenopathy in the cervical, axillary or inguinal LUNGS: clear to auscultation and percussion with bilateral crackles in the lung bases.  HEART: regular rate and rhythm with a loud systolic murmur. Moderate to severe bilateral lower extremity edema with significant chronic venous stasis changes. No new ulceration.  ABDOMEN:abdomen soft, non-tender and normal bowel sounds Musculoskeletal:no cyanosis of digits and no clubbing  PSYCH: alert & oriented x 3 with fluent speech NEURO: no focal motor/sensory deficits  LABORATORY DATA:   I have reviewed the data as listed Recent Results (from the past 2160 hour(s))  CBC WITH DIFFERENTIAL     Status: Abnormal   Collection Time    07/11/13  6:02 AM      Result Value Ref Range   WBC 18.2 (*) 4.0 - 10.5 K/uL   RBC 4.17 (*) 4.22 - 5.81 MIL/uL   Hemoglobin 10.6 (*) 13.0 - 17.0 g/dL   HCT 33.7 (*) 39.0 - 52.0 %   MCV 80.8  78.0 - 100.0 fL   MCH 25.4 (*) 26.0 - 34.0 pg   MCHC 31.5  30.0 - 36.0 g/dL   RDW 19.7 (*) 11.5 - 15.5 %   Platelets 327  150 - 400 K/uL  Neutrophils Relative % 62  43 - 77 %   Lymphocytes Relative 24  12 - 46 %   Monocytes Relative 5  3 - 12 %   Eosinophils Relative 3  0 - 5 %   Basophils Relative 6 (*) 0 - 1 %   Band Neutrophils 0  0 - 10 %   Metamyelocytes Relative 0     Myelocytes 0     Promyelocytes Absolute 0     Blasts 0     nRBC 0  0 /100 WBC   Neutro Abs 11.3 (*) 1.7 - 7.7 K/uL   Lymphs Abs 4.4 (*) 0.7 - 4.0 K/uL   Monocytes Absolute 0.9  0.1 - 1.0 K/uL   Eosinophils Absolute 0.5  0.0 - 0.7 K/uL   Basophils Absolute 1.1 (*) 0.0 - 0.1 K/uL   RBC Morphology ELLIPTOCYTES     Comment: POLYCHROMASIA PRESENT     TEARDROP CELLS   WBC Morphology       Value: MARKED LEFT SHIFT (>5% METAS,MYELOS AND PROS, OCC BLAST NOTED)   Smear Review LARGE PLATELETS PRESENT    BASIC METABOLIC PANEL     Status: Abnormal   Collection Time    07/11/13  6:02 AM      Result Value Ref Range   Sodium 139  137 - 147 mEq/L   Potassium 4.5  3.7 - 5.3 mEq/L   Chloride 100  96 - 112 mEq/L   CO2 23  19 - 32 mEq/L   Glucose, Bld 99  70 - 99 mg/dL   BUN 31 (*) 6 - 23 mg/dL   Creatinine, Ser 1.56 (*) 0.50 - 1.35 mg/dL   Calcium 10.1  8.4 - 10.5 mg/dL   GFR calc non Af Amer 43 (*) >90 mL/min   GFR calc Af Amer 50 (*) >90 mL/min   Comment: (NOTE)     The eGFR has been calculated using the CKD EPI equation.     This calculation has not been validated in all clinical situations.     eGFR's persistently <90 mL/min signify possible Chronic Kidney     Disease.  PRO B  NATRIURETIC PEPTIDE     Status: Abnormal   Collection Time    07/11/13  6:02 AM      Result Value Ref Range   Pro B Natriuretic peptide (BNP) 306.5 (*) 0 - 125 pg/mL  PATHOLOGIST SMEAR REVIEW     Status: None   Collection Time    07/11/13  6:02 AM      Result Value Ref Range   Path Review Neutrophilia with left shift and rare blasts.     Comment: Atypical lymphocytes.     Anemia with NRBC's, anisocytosis, and polychromasia.     Reviewed by Chrystie Nose. Saralyn Pilar, M.D.     07/12/13  WOUND CULTURE     Status: None   Collection Time    07/11/13  7:38 AM      Result Value Ref Range   Specimen Description WOUND LEG RIGHT     Special Requests Normal     Gram Stain       Value: RARE WBC PRESENT, PREDOMINANTLY PMN     RARE SQUAMOUS EPITHELIAL CELLS PRESENT     MODERATE GRAM NEGATIVE RODS     MODERATE GRAM POSITIVE COCCI     IN PAIRS   Culture       Value: ABUNDANT PSEUDOMONAS AERUGINOSA     MODERATE STAPHYLOCOCCUS AUREUS     Note:  RIFAMPIN AND GENTAMICIN SHOULD NOT BE USED AS SINGLE DRUGS FOR TREATMENT OF STAPH INFECTIONS. This organism DOES NOT demonstrate inducible Clindamycin resistance in vitro.     Performed at [facility]   Report Status 07/16/2013 FINAL     Organism ID, Bacteria PSEUDOMONAS AERUGINOSA     Organism ID, Bacteria STAPHYLOCOCCUS AUREUS    GLUCOSE, CAPILLARY     Status: Abnormal   Collection Time    07/11/13  5:29 PM      Result Value Ref Range   Glucose-Capillary 124 (*) 70 - 99 mg/dL  GLUCOSE, CAPILLARY     Status: Abnormal   Collection Time    07/11/13  8:43 PM      Result Value Ref Range   Glucose-Capillary 111 (*) 70 - 99 mg/dL  BASIC METABOLIC PANEL     Status: Abnormal   Collection Time    07/12/13  5:12 AM      Result Value Ref Range   Sodium 141  137 - 147 mEq/L   Potassium 5.2  3.7 - 5.3 mEq/L   Chloride 105  96 - 112 mEq/L   CO2 25  19 - 32 mEq/L   Glucose, Bld 119 (*) 70 - 99 mg/dL   BUN 29 (*) 6 - 23 mg/dL   Creatinine, Ser 1.51 (*) 0.50  - 1.35 mg/dL   Calcium 9.7  8.4 - 10.5 mg/dL   GFR calc non Af Amer 45 (*) >90 mL/min   GFR calc Af Amer 52 (*) >90 mL/min   Comment: (NOTE)     The eGFR has been calculated using the CKD EPI equation.     This calculation has not been validated in all clinical situations.     eGFR's persistently <90 mL/min signify possible Chronic Kidney     Disease.  CBC     Status: Abnormal   Collection Time    07/12/13  5:12 AM      Result Value Ref Range   WBC 15.6 (*) 4.0 - 10.5 K/uL   Comment: WHITE COUNT CONFIRMED ON SMEAR   RBC 3.82 (*) 4.22 - 5.81 MIL/uL   Hemoglobin 9.7 (*) 13.0 - 17.0 g/dL   HCT 30.7 (*) 39.0 - 52.0 %   MCV 80.4  78.0 - 100.0 fL   MCH 25.4 (*) 26.0 - 34.0 pg   MCHC 31.6  30.0 - 36.0 g/dL   RDW 19.7 (*) 11.5 - 15.5 %   Platelets 307  150 - 400 K/uL   Comment: PLATELET COUNT CONFIRMED BY SMEAR  GLUCOSE, CAPILLARY     Status: None   Collection Time    07/12/13  7:34 AM      Result Value Ref Range   Glucose-Capillary 80  70 - 99 mg/dL  GLUCOSE, CAPILLARY     Status: None   Collection Time    07/12/13 11:58 AM      Result Value Ref Range   Glucose-Capillary 74  70 - 99 mg/dL  GLUCOSE, CAPILLARY     Status: None   Collection Time    07/12/13  4:50 PM      Result Value Ref Range   Glucose-Capillary 92  70 - 99 mg/dL  CBC     Status: Abnormal   Collection Time    07/13/13  4:15 AM      Result Value Ref Range   WBC 18.6 (*) 4.0 - 10.5 K/uL   Comment: WHITE COUNT CONFIRMED ON SMEAR   RBC 3.88 (*) 4.22 -  5.81 MIL/uL   Hemoglobin 10.0 (*) 13.0 - 17.0 g/dL   HCT 30.9 (*) 39.0 - 52.0 %   MCV 79.6  78.0 - 100.0 fL   MCH 25.8 (*) 26.0 - 34.0 pg   MCHC 32.4  30.0 - 36.0 g/dL   RDW 19.7 (*) 11.5 - 15.5 %   Platelets 355  150 - 400 K/uL   Comment: SPECIMEN CHECKED FOR CLOTS     REPEATED TO VERIFY     PLATELET COUNT CONFIRMED BY SMEAR     LARGE PLATELETS PRESENT  BASIC METABOLIC PANEL     Status: Abnormal   Collection Time    07/13/13  4:15 AM      Result Value Ref  Range   Sodium 140  137 - 147 mEq/L   Potassium 4.7  3.7 - 5.3 mEq/L   Chloride 103  96 - 112 mEq/L   CO2 23  19 - 32 mEq/L   Glucose, Bld 97  70 - 99 mg/dL   BUN 30 (*) 6 - 23 mg/dL   Creatinine, Ser 1.37 (*) 0.50 - 1.35 mg/dL   Calcium 9.6  8.4 - 10.5 mg/dL   GFR calc non Af Amer 50 (*) >90 mL/min   GFR calc Af Amer 58 (*) >90 mL/min   Comment: (NOTE)     The eGFR has been calculated using the CKD EPI equation.     This calculation has not been validated in all clinical situations.     eGFR's persistently <90 mL/min signify possible Chronic Kidney     Disease.  CBC     Status: Abnormal   Collection Time    07/14/13  4:00 AM      Result Value Ref Range   WBC 17.7 (*) 4.0 - 10.5 K/uL   Comment: WHITE COUNT CONFIRMED ON SMEAR     REPEATED TO VERIFY   RBC 3.73 (*) 4.22 - 5.81 MIL/uL   Hemoglobin 9.5 (*) 13.0 - 17.0 g/dL   HCT 29.9 (*) 39.0 - 52.0 %   MCV 80.2  78.0 - 100.0 fL   MCH 25.5 (*) 26.0 - 34.0 pg   MCHC 31.8  30.0 - 36.0 g/dL   RDW 19.5 (*) 11.5 - 15.5 %   Platelets 321  150 - 400 K/uL   Comment: PLATELET COUNT CONFIRMED BY SMEAR     REPEATED TO VERIFY  GLUCOSE, CAPILLARY     Status: None   Collection Time    07/14/13  7:28 AM      Result Value Ref Range   Glucose-Capillary 93  70 - 99 mg/dL  GLUCOSE, CAPILLARY     Status: Abnormal   Collection Time    07/14/13 12:08 PM      Result Value Ref Range   Glucose-Capillary 142 (*) 70 - 99 mg/dL  CBC WITH DIFFERENTIAL     Status: Abnormal   Collection Time    07/24/13 12:13 PM      Result Value Ref Range   WBC 20.5 Repeated and verified X2. (*) 4.5 - 10.5 K/uL   RBC 3.76 (*) 4.22 - 5.81 Mil/uL   Hemoglobin 9.4 (*) 13.0 - 17.0 g/dL   HCT 30.1 (*) 39.0 - 52.0 %   MCV 80.2  78.0 - 100.0 fl   MCHC 31.3  30.0 - 36.0 g/dL   RDW 20.5 (*) 11.5 - 14.6 %   Platelets 472.0 Repeated and verified X2. (*) 150.0 - 400.0 K/uL   Neutrophils Relative % 68.4  43.0 - 77.0 %   Lymphocytes Relative 21.9  12.0 - 46.0 %   Monocytes  Relative 3.5  3.0 - 12.0 %   Eosinophils Relative 3.9  0.0 - 5.0 %   Basophils Relative 2.3  0.0 - 3.0 %   Neutro Abs 14.0 (*) 1.4 - 7.7 K/uL   Lymphs Abs 4.5 (*) 0.7 - 4.0 K/uL   Monocytes Absolute 0.7  0.1 - 1.0 K/uL   Eosinophils Absolute 0.8 (*) 0.0 - 0.7 K/uL   Basophils Absolute 0.5 (*) 0.0 - 0.1 K/uL  BASIC METABOLIC PANEL     Status: Abnormal   Collection Time    07/24/13 12:13 PM      Result Value Ref Range   Sodium 140  135 - 145 mEq/L   Potassium 4.3  3.5 - 5.1 mEq/L   Chloride 107  96 - 112 mEq/L   CO2 27  19 - 32 mEq/L   Glucose, Bld 76  70 - 99 mg/dL   BUN 38 (*) 6 - 23 mg/dL   Creatinine, Ser 1.5  0.4 - 1.5 mg/dL   Calcium 9.7  8.4 - 10.5 mg/dL   GFR 59.68 (*) >60.00 mL/min  CBC WITH DIFFERENTIAL     Status: Abnormal   Collection Time    08/07/13  9:50 AM      Result Value Ref Range   WBC 14.9 (*) 4.5 - 10.5 K/uL   RBC 3.86 (*) 4.22 - 5.81 Mil/uL   Hemoglobin 9.9 (*) 13.0 - 17.0 g/dL   HCT 30.3 (*) 39.0 - 52.0 %   MCV 78.7  78.0 - 100.0 fl   MCHC 32.5  30.0 - 36.0 g/dL   RDW 20.4 (*) 11.5 - 14.6 %   Platelets 370.0  150.0 - 400.0 K/uL   Neutrophils Relative % 59.4  43.0 - 77.0 %   Lymphocytes Relative 23.4  12.0 - 46.0 %   Monocytes Relative 13.3 (*) 3.0 - 12.0 %   Eosinophils Relative 3.9  0.0 - 5.0 %   Basophils Relative 0.0  0.0 - 3.0 %   Neutro Abs 8.8 (*) 1.4 - 7.7 K/uL   Lymphs Abs 3.5  0.7 - 4.0 K/uL   Monocytes Absolute 2.0 (*) 0.1 - 1.0 K/uL   Eosinophils Absolute 0.6  0.0 - 0.7 K/uL   Basophils Absolute 0.0  0.0 - 0.1 K/uL  BASIC METABOLIC PANEL     Status: Abnormal   Collection Time    09/08/13  9:27 AM      Result Value Ref Range   Sodium 139  135 - 145 mEq/L   Potassium 4.4  3.5 - 5.1 mEq/L   Chloride 106  96 - 112 mEq/L   CO2 28  19 - 32 mEq/L   Glucose, Bld 86  70 - 99 mg/dL   BUN 36 (*) 6 - 23 mg/dL   Creatinine, Ser 1.3  0.4 - 1.5 mg/dL   Calcium 9.9  8.4 - 10.5 mg/dL   GFR 71.09  >60.00 mL/min  CBC WITH DIFFERENTIAL     Status:  Abnormal   Collection Time    09/08/13  9:27 AM      Result Value Ref Range   WBC   (*) 4.5 - 10.5 K/uL   Value: 17.6 abnormal cells  noted on smear referred for pathology review.   RBC 3.88 (*) 4.22 - 5.81 Mil/uL   Hemoglobin 9.8 (*) 13.0 - 17.0 g/dL   HCT 30.9 (*) 39.0 - 52.0 %  MCV 79.7  78.0 - 100.0 fl   MCHC 31.8  30.0 - 36.0 g/dL   RDW 20.9 (*) 11.5 - 14.6 %   Platelets 276.0  150.0 - 400.0 K/uL   Neutrophils Relative % 56.7  43.0 - 77.0 %   Lymphocytes Relative 25.8  12.0 - 46.0 %   Monocytes Relative 12.0  3.0 - 12.0 %   Eosinophils Relative 2.8  0.0 - 5.0 %   Basophils Relative 2.7  0.0 - 3.0 %   Neutro Abs 10.0 (*) 1.4 - 7.7 K/uL   Lymphs Abs 4.5 (*) 0.7 - 4.0 K/uL   Monocytes Absolute 2.1 (*) 0.1 - 1.0 K/uL   Eosinophils Absolute 0.5  0.0 - 0.7 K/uL   Basophils Absolute 0.5 (*) 0.0 - 0.1 K/uL  PATHOLOGIST SMEAR REVIEW     Status: None   Collection Time    09/08/13  1:30 PM      Result Value Ref Range   Path Review SEE NOTE     Comment:       Elevated white count with immature granulocytic cells including     blasts, promyelocytes, and myelocytes.  NRBCs and abnormal RBC     morphology also noted.  Recommend full hematologic evaluation,     if clinically indicated.      Reviewed by Francis Gaines Mammarappallil MD (Electronic Signature on File)     09/11/2013   ASSESSMENT & PLAN #1 Leukocytosis This is chronic, likely reactive in nature. I will order additional workup for this. #2 cough I will order a chest x-ray to evaluate #3 pressure sore I recommend conservative management to reduce excessive pressure on the site and close monitoring #4 chronic venous ulceration/insufficiency Examination did not reveal new ulcer. The patient requested a vascular consult. I will touch base with his primary care provider first #5 history of thrombocytosis His most recent iron studies did not suggest iron deficiency. This could be reactive in nature. I will touch base with his  primary care provider whether we can get him off anagrelide if myeloproliferative disorder is ruled out. Anagrelide can cause long-term undesirable side effects such as leg edema and myelofibrosis.

## 2013-10-03 NOTE — Telephone Encounter (Signed)
gv adn printed appt sched and avs for pt for June...sent pt to lab.Juan [name]

## 2013-10-05 LAB — ERYTHROPOIETIN: Erythropoietin: 5.9 m[IU]/mL (ref 2.6–18.5)

## 2013-10-06 ENCOUNTER — Telehealth: Payer: Self-pay | Admitting: *Deleted

## 2013-10-06 ENCOUNTER — Telehealth: Payer: Self-pay | Admitting: Hematology and Oncology

## 2013-10-06 ENCOUNTER — Other Ambulatory Visit: Payer: Self-pay | Admitting: *Deleted

## 2013-10-06 NOTE — Telephone Encounter (Signed)
I spoke with the patient and informed him about the abnormal smear. I recommend a bone marrow aspirate and biopsy as soon as possible. Due to transportation issue and my scheduling, unfortunately I will not be able to perform the bone marrow biopsy until next Wednesday on 10/11/2013. Unfortunately the patient will not be made able to make it that day. We have agreed to do the procedure on June 2 instead.

## 2013-10-06 NOTE — Telephone Encounter (Signed)
sched pt appt...cameo will call pt

## 2013-10-06 NOTE — Telephone Encounter (Signed)
Opened phone note in error 

## 2013-10-06 NOTE — Telephone Encounter (Signed)
Attempted to call the patient regarding abnormal smear. Unfortunately, I cannot get hold him. I spoke with his daughter who will try to reach him today. I will try to call the patient again later today.

## 2013-10-06 NOTE — Telephone Encounter (Signed)
Pt called to say that he wants to discuss the bone marrow biopsy w/ Dr. [name] again on June 2 nd before he has it done.  Informed pt he will see MD in office first and he will have a chance to discuss it further prior to test.  He verbalized understanding.  He will arrive early for his appt.Marland Kitchen

## 2013-10-17 ENCOUNTER — Encounter: Payer: Self-pay | Admitting: Hematology and Oncology

## 2013-10-17 ENCOUNTER — Telehealth: Payer: Self-pay | Admitting: Hematology and Oncology

## 2013-10-17 ENCOUNTER — Ambulatory Visit (HOSPITAL_BASED_OUTPATIENT_CLINIC_OR_DEPARTMENT_OTHER): Payer: Medicare Other | Admitting: Hematology and Oncology

## 2013-10-17 VITALS — BP 137/62 | HR 71 | Temp 98.1°F | Resp 20 | Ht 75.0 in | Wt 267.2 lb

## 2013-10-17 DIAGNOSIS — D649 Anemia, unspecified: Secondary | ICD-10-CM

## 2013-10-17 DIAGNOSIS — D473 Essential (hemorrhagic) thrombocythemia: Secondary | ICD-10-CM

## 2013-10-17 DIAGNOSIS — D72829 Elevated white blood cell count, unspecified: Secondary | ICD-10-CM

## 2013-10-17 DIAGNOSIS — L0390 Cellulitis, unspecified: Secondary | ICD-10-CM

## 2013-10-17 DIAGNOSIS — D759 Disease of blood and blood-forming organs, unspecified: Secondary | ICD-10-CM

## 2013-10-17 NOTE — Progress Notes (Signed)
[name], MD DIAGNOSIS:  Anemia on background history of thrombocytosis  SUMMARY OF HEMATOLOGIC HISTORY: He was found to have abnormal CBC from routine blood tests. I had the opportunity to review his CBC from 2007. He was noted to have recurrent leukocytosis since December of 2013. In July 2014, his white blood cell count went back to normal. Setting around August 2014, it became chronically elevated ranging from 12.4 to as high as 20.5. The patient also have recurrent thrombocytosis. In 2007, his blood count was as high as 669,000. Since then, it has fluctuated between normal range to 472,000. He was also noted to be chronically anemic since 2007. He is currently on oral iron supplements. INTERVAL HISTORY: Juan [name] 73 y.o. male returns for further followup. The patient is a very poor historian. He denies new complaints. He continues a mild productive cough in the morning of phlegm. He denies any recent fever, chills, night sweats or abnormal weight loss  I have reviewed the past medical history, past surgical history, social history and family history with the patient and they are unchanged from previous note.  ALLERGIES:  has No Known Allergies.  MEDICATIONS:  Current Outpatient Prescriptions  Medication Sig Dispense Refill  . acetaminophen (TYLENOL) 500 MG tablet Take 1,000 mg by mouth every 6 (six) hours as needed for pain.      Marland Kitchen anagrelide (AGRYLIN) 0.5 MG capsule TAKE ONE CAPSULE BY MOUTH TWICE DAILY  60 capsule  2  . b complex vitamins capsule Take 1 capsule by mouth daily.  30 capsule  3  . Ferrous Sulfate Dried 200 (65 FE) MG TABS Take 1 tablet by mouth daily.       . fluticasone (FLONASE) 50 MCG/ACT nasal spray Place 2 sprays into both nostrils daily.       . folic acid (FOLVITE) 1 MG tablet Take 1 tablet (1 mg total) by mouth daily.  30 tablet  3  . furosemide (LASIX) 40 MG tablet TAKE 3 TABLETS BY MOUTH DAILY  90 tablet   3  . HYDROcodone-acetaminophen (NORCO/VICODIN) 5-325 MG per tablet Take 1 tablet by mouth every 4 (four) hours as needed.  20 tablet  0  . lisinopril (PRINIVIL,ZESTRIL) 20 MG tablet Take 20 mg by mouth daily.      . potassium chloride SA (K-DUR,KLOR-CON) 20 MEQ tablet TAKE 1 TABLET BY MOUTH DAILY  30 tablet  3   No current facility-administered medications for this visit.     REVIEW OF SYSTEMS:   Constitutional: Denies fevers, chills or night sweats All other systems were reviewed with the patient and are negative.  PHYSICAL EXAMINATION: ECOG PERFORMANCE STATUS: 1 - Symptomatic but completely ambulatory  Filed Vitals:   10/17/13 0911  BP: 137/62  Pulse: 71  Temp: 98.1 F (36.7 C)  Resp: 20   Filed Weights   10/17/13 0911  Weight: 267 lb 3.2 oz (121.201 kg)    GENERAL:alert, no distress and comfortable. He is morbidly obese SKIN: No other chronic venous stasis changes in his legs.  EYES: normal, Conjunctiva are pink and non-injected, sclera clear Musculoskeletal:no cyanosis of digits and no clubbing  NEURO: alert & oriented x 3 with fluent speech, no focal motor/sensory deficits  LABORATORY DATA:  I have reviewed the data as listed No results found for this or any previous visit (from the past 48 hour(s)).  Lab Results  Component Value Date   WBC 18.5* 10/03/2013   HGB 9.8* 10/03/2013  HCT 31.1* 10/03/2013   MCV 80.0 10/03/2013   PLT 249 10/03/2013   ASSESSMENT & PLAN:  ANEMIA-NOS The patient was referred to me because of anemia and thrombocytosis. Nutritional studies were negative for iron deficiency. 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 I discussed with the patient the importance of pursuing additional workup such as bone marrow aspirate and biopsy. The patient is not keen to pursue this today. He once to return to see me in 30 days with repeat blood work  and to proceed with bone marrow biopsy at that time he if needed. He wanted me to call called his daughter to make sure she agrees. I spoke with her today.   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 He was on anagrelide. I am concerned about risk of myelofibrosis or transformation to myelodysplastic syndrome. I told him to discontinue anagrelide for now.  Leukocytosis, unspecified I am concerned that this could be related to underlying bone marrow disorder. Peripheral blood did not reveal significant increased blasts. Bone marrow biopsy was discussed with the patient that he wants to defer to the future.    All questions were answered. The patient knows to call the clinic with any problems, questions or concerns. No barriers to learning was detected.  I spent 25 minutes counseling the patient face to face. The total time spent in the appointment was 30 minutes and more than 50% was on counseling.     [name], MD 10/17/2013 12:49 PM

## 2013-10-17 NOTE — Telephone Encounter (Signed)
gv adn pritned aptp sched and avs for pt for July...emailed Dr. [name] for type of tx...will sched tx

## 2013-10-17 NOTE — Assessment & Plan Note (Signed)
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 He was on anagrelide. I am concerned about risk of myelofibrosis or transformation to myelodysplastic syndrome. I told him to discontinue anagrelide for now.

## 2013-10-17 NOTE — Assessment & Plan Note (Signed)
I am concerned that this could be related to underlying bone marrow disorder. Peripheral blood did not reveal significant increased blasts. Bone marrow biopsy was discussed with the patient that he wants to defer to the future.

## 2013-10-17 NOTE — Assessment & Plan Note (Addendum)
The patient was referred to me because of anemia and thrombocytosis. Nutritional studies were negative for iron deficiency. 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 I discussed with the patient the importance of pursuing additional workup such as bone marrow aspirate and biopsy. The patient is not keen to pursue this today. He once to return to see me in 30 days with repeat blood work and to proceed with bone marrow biopsy at that time he if needed. He wanted me to call called his daughter to make sure she agrees. I spoke with her today.

## 2013-10-18 ENCOUNTER — Encounter: Payer: Self-pay | Admitting: Specialist

## 2013-10-18 ENCOUNTER — Telehealth: Payer: Self-pay | Admitting: *Deleted

## 2013-10-18 NOTE — Telephone Encounter (Signed)
Medication List and allergies: Reviewed and updated  90 Day supply/mail order: None Local prescriptions: [facility] on [address]  Immunizations Due: Zostavax  A/P FH, PSH, or Personal YQ:MGNOIBBC, no changes Flu vaccine:  04/12/13 Tdap: :  (Td 08/2009) Shingles: Due CCS:  Never had per pt PSA:   01/14/2012 0.90  To Discuss with Provider:  Nothing at this time

## 2013-10-18 NOTE — Progress Notes (Signed)
Patient is a man of [place]. His wife died several years ago and as she was dying, he felt supported by chaplains at the hospital. In this current illness, he seeks the same support for himself. He requested prayer today and for himself when he comes for his appointment on July 2nd. Empathetic listening; prayer; grief support; anxiety reduction.  Epifania Gore, PhD, [place]

## 2013-10-19 ENCOUNTER — Encounter: Payer: Self-pay | Admitting: Internal Medicine

## 2013-10-19 ENCOUNTER — Ambulatory Visit (INDEPENDENT_AMBULATORY_CARE_PROVIDER_SITE_OTHER): Payer: Medicare Other | Admitting: Internal Medicine

## 2013-10-19 VITALS — BP 129/68 | HR 60 | Temp 98.0°F | Ht 75.0 in | Wt 271.0 lb

## 2013-10-19 DIAGNOSIS — Z0000 Encounter for general adult medical examination without abnormal findings: Secondary | ICD-10-CM | POA: Diagnosis not present

## 2013-10-19 DIAGNOSIS — Z125 Encounter for screening for malignant neoplasm of prostate: Secondary | ICD-10-CM | POA: Diagnosis not present

## 2013-10-19 DIAGNOSIS — R7303 Prediabetes: Secondary | ICD-10-CM

## 2013-10-19 DIAGNOSIS — R7309 Other abnormal glucose: Secondary | ICD-10-CM | POA: Diagnosis not present

## 2013-10-19 DIAGNOSIS — Z9119 Patient's noncompliance with other medical treatment and regimen: Secondary | ICD-10-CM

## 2013-10-19 DIAGNOSIS — G4733 Obstructive sleep apnea (adult) (pediatric): Secondary | ICD-10-CM

## 2013-10-19 DIAGNOSIS — D649 Anemia, unspecified: Secondary | ICD-10-CM | POA: Diagnosis not present

## 2013-10-19 DIAGNOSIS — D759 Disease of blood and blood-forming organs, unspecified: Secondary | ICD-10-CM | POA: Diagnosis not present

## 2013-10-19 DIAGNOSIS — I10 Essential (primary) hypertension: Secondary | ICD-10-CM

## 2013-10-19 DIAGNOSIS — I2510 Atherosclerotic heart disease of native coronary artery without angina pectoris: Secondary | ICD-10-CM

## 2013-10-19 DIAGNOSIS — Z91199 Patient's noncompliance with other medical treatment and regimen due to unspecified reason: Secondary | ICD-10-CM

## 2013-10-19 LAB — AST: AST: 26 U/L (ref 0–37)

## 2013-10-19 LAB — ALT: ALT: 14 U/L (ref 0–53)

## 2013-10-19 LAB — PSA: PSA: 0.69 ng/mL (ref 0.10–4.00)

## 2013-10-19 LAB — HEMOGLOBIN A1C: HEMOGLOBIN A1C: 6 % (ref 4.6–6.5)

## 2013-10-19 NOTE — Assessment & Plan Note (Signed)
Unfortunately, patient is not compliant with advise, will keep trying every time he comes to the office.

## 2013-10-19 NOTE — Patient Instructions (Signed)
Get your blood work before you leave     Next visit is for routine check up in 6 months  No need to come back fasting Please make an appointment     Fall Prevention and [place] cause injuries and can affect all age groups. It is possible to use preventive measures to significantly decrease the likelihood of falls. There are many simple measures which can make your home safer and prevent falls. OUTDOORS  Repair cracks and edges of walkways and driveways.  Remove high doorway thresholds.  Trim shrubbery on the main path into your home.  Have good outside lighting.  Clear walkways of tools, rocks, debris, and clutter.  Check that handrails are not broken and are securely fastened. Both sides of steps should have handrails.  Have leaves, snow, and ice cleared regularly.  Use sand or salt on walkways during winter months.  In the garage, clean up grease or oil spills. BATHROOM  Install night lights.  Install grab bars by the toilet and in the tub and shower.  Use non-skid mats or decals in the tub or shower.  Place a plastic non-slip stool in the shower to sit on, if needed.  Keep floors dry and clean up all water on the floor immediately.  Remove soap buildup in the tub or shower on a regular basis.  Secure bath mats with non-slip, double-sided rug tape.  Remove throw rugs and tripping hazards from the floors. BEDROOMS  Install night lights.  Make sure a bedside light is easy to reach.  Do not use oversized bedding.  Keep a telephone by your bedside.  Have a firm chair with side arms to use for getting dressed.  Remove throw rugs and tripping hazards from the floor. KITCHEN  Keep handles on pots and pans turned toward the center of the stove. Use back burners when possible.  Clean up spills quickly and allow time for drying.  Avoid walking on wet floors.  Avoid hot utensils and knives.  Position shelves so they are not too high or  low.  Place commonly used objects within easy reach.  If necessary, use a sturdy step stool with a grab bar when reaching.  Keep electrical cables out of the way.  Do not use floor polish or wax that makes floors slippery. If you must use wax, use non-skid floor wax.  Remove throw rugs and tripping hazards from the floor. STAIRWAYS  Never leave objects on stairs.  Place handrails on both sides of stairways and use them. Fix any loose handrails. Make sure handrails on both sides of the stairways are as long as the stairs.  Check carpeting to make sure it is firmly attached along stairs. Make repairs to worn or loose carpet promptly.  Avoid placing throw rugs at the top or bottom of stairways, or properly secure the rug with carpet tape to prevent slippage. Get rid of throw rugs, if possible.  Have an electrician put in a light switch at the top and bottom of the stairs. OTHER FALL PREVENTION TIPS  Wear low-heel or rubber-soled shoes that are supportive and fit well. Wear closed toe shoes.  When using a stepladder, make sure it is fully opened and both spreaders are firmly locked. Do not climb a closed stepladder.  Add color or contrast paint or tape to grab bars and handrails in your home. Place contrasting color strips on first and last steps.  Learn and use mobility aids as needed. Install an Dealer  emergency response system.  Turn on lights to avoid dark areas. Replace light bulbs that burn out immediately. Get light switches that glow.  Arrange furniture to create clear pathways. Keep furniture in the same place.  Firmly attach carpet with non-skid or double-sided tape.  Eliminate uneven floor surfaces.  Select a carpet pattern that does not visually hide the edge of steps.  Be aware of all pets. OTHER HOME SAFETY TIPS  Set the water temperature for 120 F (48.8 C).  Keep emergency numbers on or near the telephone.  Keep smoke detectors on every level of the  home and near sleeping areas. Document Released: 04/24/2002 Document Revised: 11/03/2011 Document Reviewed: 07/24/2011 [facility] Patient Information 2014 [place].

## 2013-10-19 NOTE — Assessment & Plan Note (Addendum)
Still taking agryllin despite hematology advise, "they told me in [place] to take Agrylin" (that was many years ago) Explained need to follow current advise, pt will not accept my recommendations despite multiple attempts  Plan--  Will be unable to RF Agrylin since that   medication is not longer recommended by  hematology rec asa 325

## 2013-10-19 NOTE — Assessment & Plan Note (Signed)
Controlled , recent BMP ok

## 2013-10-19 NOTE — Assessment & Plan Note (Addendum)
Td 2011 Pneumonia shot 11-07 and 2013 zostavax -- hematology suspect blood dyscrasia,  Hold zostavax  Cscope (incomplete) in  2005 and f/u by  BE: Dx w/  TICS  (done elsewhere, was told normal) , no records, we discussed possibly repeat a colonoscopy because the previous is that he is 8 years ago and he was incomplete. Pt declined further cscopes  Diet exercise discussed

## 2013-10-19 NOTE — Progress Notes (Signed)
Subjective:    Patient ID: Juan [name], male    DOB: 11/16/1940, 73 y.o.   MRN: [id number]  DOS:  10/19/2013 Type of  Visit:  Here for Medicare AWV: 1. Risk factors based on Past M, S, F history: reviewed 2. Physical Activities: sedentary 3. Depression/mood: Neg screen  4. Hearing:  No problemss noted or reported   5. ADL's: independent , sedentary 6. Fall Risk: no recent events, prevention discussed   7. home Safety: does feel safe at home   8. Height, weight, &visual acuity: see VS, rec to see the eye doctor  9. Counseling: provided 10. Labs ordered based on risk factors: if needed   11. Referral Coordination: if needed 12.  Care Plan, see assessment and plan   13.   Cognitive Assessment: at baseline  In addition, today we discussed the following: CAD-- fatigue, SOB at baseline HTN-- re[ports good med compliance, no amb readings Anemia-thrombocytosis-- chart reviewed, see a/p    ROS Denies  nausea, vomiting diarrhea, blood in the stools No abdominal pain occ  Cough and sputum production (clear) (-) wheezing,  (-)hemoptysis No dysuria, gross hematuria, difficulty urinating     Past Medical History  Diagnosis Date  . CAD (coronary artery disease)     dx elsewheer in past, no documentation. Non-ischemic myovue 2007  . CHF (congestive heart failure)     dx elsewhere, no documentation. Normal EF by previous echo. Trival AS needs f/u ECHO by 2012  . Hypertension   . Sleep apnea, obstructive     at some point used CPAP, was d/c  years ago  . Anemia   . History of thrombocytosis   . Allergic rhinitis   . Edema     R>L leg, u/s 5-12 neg for DVT  . Hemorrhoid   . Type II diabetes mellitus   . Sinus congestion   . Migraine     "once/wk at least" (07/11/2013)    Past Surgical History  Procedure Laterality Date  . Toe surgery Right     "tried to straighten out big toe" (07/11/2013)    History   Social History  . Marital Status: Widowed    Spouse Name: N/A   Number of Children: 2  . Years of Education: N/A   Occupational History  . retired    .     Social History Main Topics  . Smoking status: Former Smoker -- 0.25 packs/day for 12 years    Types: Cigarettes    Quit date: 05/18/1966  . Smokeless tobacco: Never Used  . Alcohol Use: No  . Drug Use: No  . Sexual Activity: Yes   Other Topics Concern  . Not on file   Social History Narrative   Moved from [place] 2006   Widow 2007   Son lives with him      Family History  Problem Relation Age of Onset  . Colon cancer Neg Hx   . Prostate cancer Neg Hx   . Heart attack Neg Hx   . Diabetes Neg Hx   . Schizophrenia Son        Medication List       This list is accurate as of: 10/19/13  8:48 AM.  Always use your most recent med list.               acetaminophen 500 MG tablet  Commonly known as:  TYLENOL  Take 1,000 mg by mouth every 6 (six) hours as needed for pain.  anagrelide 0.5 MG capsule  Commonly known as:  AGRYLIN  TAKE ONE CAPSULE BY MOUTH TWICE DAILY     b complex vitamins capsule  Take 1 capsule by mouth daily.     Ferrous Sulfate Dried 200 (65 FE) MG Tabs  Take 1 tablet by mouth daily.     fluticasone 50 MCG/ACT nasal spray  Commonly known as:  FLONASE  Place 2 sprays into both nostrils daily.     folic acid 1 MG tablet  Commonly known as:  FOLVITE  Take 1 tablet (1 mg total) by mouth daily.     furosemide 40 MG tablet  Commonly known as:  LASIX  TAKE 3 TABLETS BY MOUTH DAILY     HYDROcodone-acetaminophen 5-325 MG per tablet  Commonly known as:  NORCO/VICODIN  Take 1 tablet by mouth every 4 (four) hours as needed.     lisinopril 20 MG tablet  Commonly known as:  PRINIVIL,ZESTRIL  Take 20 mg by mouth daily.     potassium chloride SA 20 MEQ tablet  Commonly known as:  K-DUR,KLOR-CON  TAKE 1 TABLET BY MOUTH DAILY           Objective:   Physical Exam BP 129/68  Pulse 60  Temp(Src) 98 F (36.7 C) (Oral)  Ht 6\' 3"  (1.905 m)  Wt 271 lb  (122.925 kg)  BMI 33.87 kg/m2  SpO2 94%  General -- alert, well-developed, NAD.  Neck --no thyromegaly  Lungs -- normal respiratory effort, no intercostal retractions, no accessory muscle use, and normal breath sounds.  Heart-- normal rate, regular rhythm, + syst murmur.  Abdomen-- Not distended, good bowel sounds,soft, non-tender.  Rectal-- No external abnormalities noted. Normal sphincter tone. No rectal masses or tenderness. Stool brown, Hemoccult negative  Prostate--Prostate gland firm and smooth, no enlargement, nodularity, tenderness, mass, asymmetry or induration. Extremities-- chronic skin changes, +/+++ edema Neurologic--  alert & oriented X3. Speech normal, gait appropriate for ag  Psych--   No anxious or depressed appearing.        Assessment & Plan:

## 2013-10-19 NOTE — Assessment & Plan Note (Signed)
Due for a a1c

## 2013-10-19 NOTE — Assessment & Plan Note (Signed)
Notes from hematology reviewed, reluctant to have further eval

## 2013-10-19 NOTE — Progress Notes (Signed)
Pre-visit discussion using our clinic review tool. No additional management support is needed unless otherwise documented below in the visit note.  

## 2013-10-20 ENCOUNTER — Encounter: Payer: Self-pay | Admitting: *Deleted

## 2013-10-20 NOTE — Progress Notes (Signed)
Letter sent with lab results.  

## 2013-11-07 ENCOUNTER — Telehealth: Payer: Self-pay | Admitting: *Deleted

## 2013-11-07 NOTE — Telephone Encounter (Signed)
Spoke with Ivin Booty at [facility] # [phone] advised of pts A1c of 6.0 on 10/21/13

## 2013-11-07 NOTE — Telephone Encounter (Signed)
Caller name:  Ivin Booty Relation to pt:  [place] Call back May:  Reason for call:   Ivin Booty with [facility] needs pt's A1c 90 days before or after February 2015.

## 2013-11-15 ENCOUNTER — Telehealth: Payer: Self-pay | Admitting: *Deleted

## 2013-11-15 DIAGNOSIS — D72829 Elevated white blood cell count, unspecified: Secondary | ICD-10-CM

## 2013-11-15 DIAGNOSIS — D649 Anemia, unspecified: Secondary | ICD-10-CM

## 2013-11-16 ENCOUNTER — Encounter: Payer: Self-pay | Admitting: Hematology and Oncology

## 2013-11-16 ENCOUNTER — Other Ambulatory Visit (HOSPITAL_BASED_OUTPATIENT_CLINIC_OR_DEPARTMENT_OTHER): Payer: Medicare Other

## 2013-11-16 ENCOUNTER — Ambulatory Visit (HOSPITAL_BASED_OUTPATIENT_CLINIC_OR_DEPARTMENT_OTHER): Payer: Medicare Other | Admitting: Hematology and Oncology

## 2013-11-16 ENCOUNTER — Telehealth: Payer: Self-pay | Admitting: Hematology and Oncology

## 2013-11-16 ENCOUNTER — Encounter: Payer: Self-pay | Admitting: Specialist

## 2013-11-16 VITALS — BP 156/70 | HR 86 | Temp 97.8°F | Resp 20 | Ht 75.0 in | Wt 270.1 lb

## 2013-11-16 DIAGNOSIS — D649 Anemia, unspecified: Secondary | ICD-10-CM | POA: Diagnosis not present

## 2013-11-16 DIAGNOSIS — D759 Disease of blood and blood-forming organs, unspecified: Secondary | ICD-10-CM | POA: Diagnosis not present

## 2013-11-16 DIAGNOSIS — L0390 Cellulitis, unspecified: Secondary | ICD-10-CM

## 2013-11-16 DIAGNOSIS — D72829 Elevated white blood cell count, unspecified: Secondary | ICD-10-CM

## 2013-11-16 LAB — MANUAL DIFFERENTIAL
ALC: 3.1 10*3/uL (ref 0.9–3.3)
ANC (CHCC MAN DIFF): 10.1 10*3/uL — AB (ref 1.5–6.5)
BAND NEUTROPHILS: 12 % — AB (ref 0–10)
BLASTS: 14 % — AB (ref 0–0)
Basophil: 5 % — ABNORMAL HIGH (ref 0–2)
EOS: 4 % (ref 0–7)
LYMPH: 17 % (ref 14–49)
MONO: 4 % (ref 0–14)
Metamyelocytes: 7 % — ABNORMAL HIGH (ref 0–0)
Myelocytes: 16 % — ABNORMAL HIGH (ref 0–0)
NRBC: 2 % — AB (ref 0–0)
OTHER CELL: 0 % (ref 0–0)
PLT EST: ADEQUATE
PROMYELO: 1 % — AB (ref 0–0)
SEG: 20 % — AB (ref 38–77)
Variant Lymph: 0 % (ref 0–0)

## 2013-11-16 LAB — CBC & DIFF AND RETIC
HCT: 30.7 % — ABNORMAL LOW (ref 38.4–49.9)
HEMOGLOBIN: 9.8 g/dL — AB (ref 13.0–17.1)
IMMATURE RETIC FRACT: 32.5 % — AB (ref 3.00–10.60)
MCH: 25.5 pg — ABNORMAL LOW (ref 27.2–33.4)
MCHC: 31.8 g/dL — ABNORMAL LOW (ref 32.0–36.0)
MCV: 80.3 fL (ref 79.3–98.0)
Platelets: 323 10*3/uL (ref 140–400)
RBC: 3.82 10*6/uL — AB (ref 4.20–5.82)
RDW: 20 % — AB (ref 11.0–14.6)
Retic %: 2.89 % — ABNORMAL HIGH (ref 0.80–1.80)
Retic Ct Abs: 110.4 10*3/uL — ABNORMAL HIGH (ref 34.80–93.90)
WBC: 18.4 10*3/uL — AB (ref 4.0–10.3)

## 2013-11-16 LAB — COMPREHENSIVE METABOLIC PANEL (CC13)
ALT: 11 U/L (ref 0–55)
ANION GAP: 9 meq/L (ref 3–11)
AST: 25 U/L (ref 5–34)
Albumin: 3.4 g/dL — ABNORMAL LOW (ref 3.5–5.0)
Alkaline Phosphatase: 125 U/L (ref 40–150)
BILIRUBIN TOTAL: 0.35 mg/dL (ref 0.20–1.20)
BUN: 20.2 mg/dL (ref 7.0–26.0)
CALCIUM: 9.8 mg/dL (ref 8.4–10.4)
CHLORIDE: 108 meq/L (ref 98–109)
CO2: 22 meq/L (ref 22–29)
CREATININE: 1.2 mg/dL (ref 0.7–1.3)
GLUCOSE: 126 mg/dL (ref 70–140)
Potassium: 4.5 mEq/L (ref 3.5–5.1)
Sodium: 138 mEq/L (ref 136–145)
Total Protein: 6.7 g/dL (ref 6.4–8.3)

## 2013-11-16 LAB — LACTATE DEHYDROGENASE (CC13): LDH: 1841 U/L — ABNORMAL HIGH (ref 125–245)

## 2013-11-16 NOTE — Telephone Encounter (Signed)
Spoke w/ Tiffany at [facility].  Pt has appt to see Dr. [name] on Thursday 7/09 at 3:45 pm.  She requests Path slides and Radiology CDs be mailed to them at;  [facility] [place];  [place], [address] [place], [place] [address]  Fax records to fax [phone].   Phone number is [phone].   Request given to HIM to send over records.

## 2013-11-16 NOTE — Telephone Encounter (Signed)
Informed pt and his son of appt w/ Dr. [name] on 7/09 at 3:45 pm.  Gave them phone number to call/change appt is needed.  Informed son of reason for referral and importance of pt keeping this appt if he wants to be treated for his cancer.  He verbalized understanding.

## 2013-11-16 NOTE — Telephone Encounter (Signed)
Faxed pt medical records to [facility]

## 2013-11-16 NOTE — Assessment & Plan Note (Signed)
I reviewed his peripheral blood smear myself. The blast count has increased from a month ago from 4% to about 14%. 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 The patient has trouble understanding the concept of leukemia. I told him that this is a life-threatening condition and would need bone marrow biopsy to find out the type of leukemia and to start treatment as soon as possible. Unfortunately, we do not treat acute leukemia at [facility]. With his permission, I will refer him to [facility]. I spoke with the physician on call today and he will make him an appointment for next week. I recommend involving family members due to diagnosis of leukemia but the patient declined.

## 2013-11-16 NOTE — Progress Notes (Signed)
[place] OFFICE PROGRESS NOTE  Patient Care Team: Colon Branch, MD as PCP - General  SUMMARY OF ONCOLOGIC HISTORY: He was found to have abnormal CBC from routine blood tests. I had the opportunity to review his CBC from 2007. He was noted to have recurrent leukocytosis since December of 2013. In July 2014, his white blood cell count went back to normal. Starting around August 2014, it became chronically elevated ranging from 12.4 to as high as 20.5. The patient also have recurrent thrombocytosis. According to the patient, he had extensive investigations at [place] and was placed on anagrelide. In 2007, his blood count was as high as 669,000. Since then, it has fluctuated between normal range to 472,000. He was also noted to be chronically anemic since 2007. He is currently on oral iron supplements.  INTERVAL HISTORY: Please see below for problem oriented charting. He denies symptoms. He denies any recent fever, chills, night sweats or abnormal weight loss Denies chest pain, shortness of breath or weakness.   REVIEW OF SYSTEMS:   Constitutional: Denies fevers, chills or abnormal weight loss Eyes: Denies blurriness of vision Ears, nose, mouth, throat, and face: Denies mucositis or sore throat Respiratory: Denies cough, dyspnea or wheezes Cardiovascular: Denies palpitation, chest discomfort or lower extremity swelling Gastrointestinal:  Denies nausea, heartburn or change in bowel habits Skin: Denies abnormal skin rashes Lymphatics: Denies new lymphadenopathy or easy bruising Neurological:Denies numbness, tingling or new weaknesses Behavioral/Psych: Mood is stable, no new changes  All other systems were reviewed with the patient and are negative.  I have reviewed the past medical history, past surgical history, social history and family history with the patient and they are unchanged from previous note.  ALLERGIES:  has No Known Allergies.  MEDICATIONS:  Current  Outpatient Prescriptions  Medication Sig Dispense Refill  . acetaminophen (TYLENOL) 500 MG tablet Take 1,000 mg by mouth every 6 (six) hours as needed for pain.      Marland Kitchen anagrelide (AGRYLIN) 0.5 MG capsule Take 0.5 mg by mouth daily.      Marland Kitchen b complex vitamins capsule Take 1 capsule by mouth daily.  30 capsule  3  . Ferrous Sulfate Dried 200 (65 FE) MG TABS Take 1 tablet by mouth daily.       . fluticasone (FLONASE) 50 MCG/ACT nasal spray Place 2 sprays into both nostrils daily.       . folic acid (FOLVITE) 1 MG tablet Take 1 tablet (1 mg total) by mouth daily.  30 tablet  3  . furosemide (LASIX) 40 MG tablet TAKE 3 TABLETS BY MOUTH DAILY  90 tablet  3  . HYDROcodone-acetaminophen (NORCO/VICODIN) 5-325 MG per tablet Take 1 tablet by mouth every 4 (four) hours as needed.  20 tablet  0  . lisinopril (PRINIVIL,ZESTRIL) 20 MG tablet Take 20 mg by mouth daily.      . potassium chloride SA (K-DUR,KLOR-CON) 20 MEQ tablet TAKE 1 TABLET BY MOUTH DAILY  30 tablet  3   No current facility-administered medications for this visit.    PHYSICAL EXAMINATION: ECOG PERFORMANCE STATUS: 0 - Asymptomatic  Filed Vitals:   11/16/13 0900  BP: 156/70  Pulse: 86  Temp: 97.8 F (36.6 C)  Resp: 20   Filed Weights   11/16/13 0900  Weight: 270 lb 1.6 oz (122.517 kg)    GENERAL:alert, no distress and comfortable. He is morbidly obese SKIN: skin color, texture, turgor are normal, no rashes or significant lesions EYES: normal, Conjunctiva are  pale and non-injected, sclera clear Musculoskeletal:no cyanosis of digits and no clubbing  NEURO: alert & oriented x 3 with fluent speech, no focal motor/sensory deficits  LABORATORY DATA:  I have reviewed the data as listed    Component Value Date/Time   NA 139 09/08/2013 0927   K 4.4 09/08/2013 0927   CL 106 09/08/2013 0927   CO2 28 09/08/2013 0927   GLUCOSE 86 09/08/2013 0927   BUN 36* 09/08/2013 0927   CREATININE 1.3 09/08/2013 0927   CALCIUM 9.9 09/08/2013 0927    PROT 7.4 01/12/2013 1100   ALBUMIN 4.0 01/12/2013 1100   ALBUMIN 4.0 01/12/2013 1100   AST 26 10/19/2013 0917   ALT 14 10/19/2013 0917   ALKPHOS 90 01/12/2013 1100   BILITOT 0.2* 01/12/2013 1100   GFRNONAA 50* 07/13/2013 0415   GFRAA 58* 07/13/2013 0415    No results found for this basename: SPEP,  UPEP,   kappa and lambda light chains    Lab Results  Component Value Date   WBC 18.4* 11/16/2013   NEUTROABS 10.0* 09/08/2013   HGB 9.8* 11/16/2013   HCT 30.7* 11/16/2013   MCV 80.3 11/16/2013   PLT 323 11/16/2013      Chemistry      Component Value Date/Time   NA 139 09/08/2013 0927   K 4.4 09/08/2013 0927   CL 106 09/08/2013 0927   CO2 28 09/08/2013 0927   BUN 36* 09/08/2013 0927   CREATININE 1.3 09/08/2013 0927      Component Value Date/Time   CALCIUM 9.9 09/08/2013 0927   ALKPHOS 90 01/12/2013 1100   AST 26 10/19/2013 0917   ALT 14 10/19/2013 0917   BILITOT 0.2* 01/12/2013 1100     Review of his peripheral blood smear confirmed increased left shift and blasts ASSESSMENT & PLAN:  Leukocytosis, unspecified I reviewed his peripheral blood smear myself. The blast count has increased from a month ago from 4% to about 14%. 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 The patient has trouble understanding the concept of leukemia. I told him that this is a life-threatening condition and would need bone marrow biopsy to find out the type of leukemia and to start treatment as soon as possible. Unfortunately, we do not treat acute leukemia at [facility]. With his permission, I will refer him to [facility]. I spoke with the physician on call today and he will make him an appointment for next week. I recommend involving family members due to diagnosis of leukemia but the patient declined.  ANEMIA-NOS This is likely due to underlying leukemia. He is not symptomatic. Recommend observation for now.    Orders Placed This Encounter  Procedures  . CBC &  Diff and Retic    Standing Status: Future     Number of Occurrences:      Standing Expiration Date: 11/16/2014   All questions were answered. The patient knows to call the clinic with any problems, questions or concerns. No barriers to learning was detected. I spent 40 minutes counseling the patient face to face. The total time spent in the appointment was 55 minutes and more than 50% was on counseling and review of test results     [facility], [place], MD 11/16/2013 9:53 AM

## 2013-11-16 NOTE — Progress Notes (Signed)
Patient had requested chaplain visit when he came for his appointment. Met him and listened to him. He is concerned about possible surgery. He said "I don't want to be cut on." Evidently, a doctor has told him he is "too weak" for surgery and warned him, "If you lie down on that table, you might not get up again." Juan [name]'s wife died several years ago and he has some family issues that are concerning him also. When his wife was dying of a "terminal illness," chaplains were an important part of his support. He would like ongoing chaplain support and prayer. Provided empathetic listening and compassionate presence; grief support. Prayed. Will continue to follow.   Juan Gore, PhD, [place]

## 2013-11-16 NOTE — Assessment & Plan Note (Signed)
This is likely due to underlying leukemia. He is not symptomatic. Recommend observation for now.

## 2013-11-20 ENCOUNTER — Telehealth: Payer: Self-pay | Admitting: *Deleted

## 2013-11-20 NOTE — Telephone Encounter (Signed)
[facility] needs diagnosis code for Jak 2 and BCR/ABL.  Asking if pt has any other diagnosis codes to add?  Medicare not covering test for the codes already provided.  Informed of new diagnosis of AML.

## 2013-11-23 ENCOUNTER — Telehealth: Payer: Self-pay | Admitting: Hematology and Oncology

## 2013-11-23 DIAGNOSIS — D7581 Myelofibrosis: Secondary | ICD-10-CM | POA: Insufficient documentation

## 2013-11-23 DIAGNOSIS — R634 Abnormal weight loss: Secondary | ICD-10-CM | POA: Diagnosis not present

## 2013-11-23 DIAGNOSIS — D473 Essential (hemorrhagic) thrombocythemia: Secondary | ICD-10-CM | POA: Diagnosis not present

## 2013-11-23 DIAGNOSIS — D649 Anemia, unspecified: Secondary | ICD-10-CM | POA: Diagnosis not present

## 2013-11-23 NOTE — Telephone Encounter (Signed)
Per 07/02 POF lft msg for pt advising of apt 12/01 labs/fu, also mailed ltr to pt...Marland KitchenMarland KitchenKJ

## 2013-11-30 DIAGNOSIS — D473 Essential (hemorrhagic) thrombocythemia: Secondary | ICD-10-CM | POA: Diagnosis not present

## 2013-11-30 DIAGNOSIS — D7581 Myelofibrosis: Secondary | ICD-10-CM | POA: Diagnosis not present

## 2013-12-09 ENCOUNTER — Emergency Department (HOSPITAL_COMMUNITY)
Admission: EM | Admit: 2013-12-09 | Discharge: 2013-12-09 | Disposition: A | Discharge status: Home / Self Care | Payer: Medicare Other | Attending: Emergency Medicine | Admitting: Emergency Medicine

## 2013-12-09 ENCOUNTER — Encounter (HOSPITAL_COMMUNITY): Payer: Self-pay | Admitting: Emergency Medicine

## 2013-12-09 ENCOUNTER — Emergency Department (HOSPITAL_COMMUNITY): Payer: Medicare Other

## 2013-12-09 DIAGNOSIS — R6889 Other general symptoms and signs: Secondary | ICD-10-CM | POA: Diagnosis not present

## 2013-12-09 DIAGNOSIS — Z79899 Other long term (current) drug therapy: Secondary | ICD-10-CM | POA: Insufficient documentation

## 2013-12-09 DIAGNOSIS — I2510 Atherosclerotic heart disease of native coronary artery without angina pectoris: Secondary | ICD-10-CM | POA: Insufficient documentation

## 2013-12-09 DIAGNOSIS — I509 Heart failure, unspecified: Secondary | ICD-10-CM | POA: Insufficient documentation

## 2013-12-09 DIAGNOSIS — M7989 Other specified soft tissue disorders: Secondary | ICD-10-CM | POA: Diagnosis not present

## 2013-12-09 DIAGNOSIS — Z9981 Dependence on supplemental oxygen: Secondary | ICD-10-CM | POA: Diagnosis not present

## 2013-12-09 DIAGNOSIS — R918 Other nonspecific abnormal finding of lung field: Secondary | ICD-10-CM | POA: Diagnosis not present

## 2013-12-09 DIAGNOSIS — R5381 Other malaise: Secondary | ICD-10-CM | POA: Diagnosis not present

## 2013-12-09 DIAGNOSIS — I10 Essential (primary) hypertension: Secondary | ICD-10-CM | POA: Insufficient documentation

## 2013-12-09 DIAGNOSIS — Z87891 Personal history of nicotine dependence: Secondary | ICD-10-CM | POA: Diagnosis not present

## 2013-12-09 DIAGNOSIS — Z8719 Personal history of other diseases of the digestive system: Secondary | ICD-10-CM | POA: Diagnosis not present

## 2013-12-09 DIAGNOSIS — Z8709 Personal history of other diseases of the respiratory system: Secondary | ICD-10-CM | POA: Insufficient documentation

## 2013-12-09 DIAGNOSIS — Z0000 Encounter for general adult medical examination without abnormal findings: Secondary | ICD-10-CM | POA: Insufficient documentation

## 2013-12-09 DIAGNOSIS — D473 Essential (hemorrhagic) thrombocythemia: Secondary | ICD-10-CM | POA: Insufficient documentation

## 2013-12-09 DIAGNOSIS — E119 Type 2 diabetes mellitus without complications: Secondary | ICD-10-CM | POA: Diagnosis not present

## 2013-12-09 DIAGNOSIS — R7989 Other specified abnormal findings of blood chemistry: Secondary | ICD-10-CM | POA: Insufficient documentation

## 2013-12-09 DIAGNOSIS — R609 Edema, unspecified: Secondary | ICD-10-CM | POA: Insufficient documentation

## 2013-12-09 DIAGNOSIS — D649 Anemia, unspecified: Secondary | ICD-10-CM | POA: Insufficient documentation

## 2013-12-09 DIAGNOSIS — R5383 Other fatigue: Secondary | ICD-10-CM | POA: Diagnosis not present

## 2013-12-09 LAB — COMPREHENSIVE METABOLIC PANEL
ALK PHOS: 111 U/L (ref 39–117)
ALT: 7 U/L (ref 0–53)
AST: 23 U/L (ref 0–37)
Albumin: 3.5 g/dL (ref 3.5–5.2)
Anion gap: 12 (ref 5–15)
BILIRUBIN TOTAL: 0.3 mg/dL (ref 0.3–1.2)
BUN: 19 mg/dL (ref 6–23)
CHLORIDE: 101 meq/L (ref 96–112)
CO2: 25 meq/L (ref 19–32)
Calcium: 9.4 mg/dL (ref 8.4–10.5)
Creatinine, Ser: 1.27 mg/dL (ref 0.50–1.35)
GFR, EST AFRICAN AMERICAN: 63 mL/min — AB (ref >90)
GFR, EST NON AFRICAN AMERICAN: 55 mL/min — AB (ref >90)
Glucose, Bld: 115 mg/dL — ABNORMAL HIGH (ref 70–99)
Potassium: 4.8 mEq/L (ref 3.7–5.3)
SODIUM: 138 meq/L (ref 137–147)
Total Protein: 6.8 g/dL (ref 6.0–8.3)

## 2013-12-09 LAB — CBC WITH DIFFERENTIAL/PLATELET
Band Neutrophils: 0 % (ref 0–10)
Basophils Absolute: 0.2 10*3/uL — ABNORMAL HIGH (ref 0.0–0.1)
Basophils Relative: 1 % (ref 0–1)
Blasts: 0 %
Eosinophils Absolute: 1.5 10*3/uL — ABNORMAL HIGH (ref 0.0–0.7)
Eosinophils Relative: 9 % — ABNORMAL HIGH (ref 0–5)
HCT: 32.9 % — ABNORMAL LOW (ref 39.0–52.0)
HEMOGLOBIN: 10 g/dL — AB (ref 13.0–17.0)
Lymphocytes Relative: 34 % (ref 12–46)
Lymphs Abs: 5.8 10*3/uL — ABNORMAL HIGH (ref 0.7–4.0)
MCH: 25.6 pg — ABNORMAL LOW (ref 26.0–34.0)
MCHC: 30.4 g/dL (ref 30.0–36.0)
MCV: 84.1 fL (ref 78.0–100.0)
MONO ABS: 1.2 10*3/uL — AB (ref 0.1–1.0)
Metamyelocytes Relative: 0 %
Monocytes Relative: 7 % (ref 3–12)
Myelocytes: 0 %
NEUTROS ABS: 8.5 10*3/uL — AB (ref 1.7–7.7)
NEUTROS PCT: 49 % (ref 43–77)
PROMYELOCYTES ABS: 0 %
Platelets: 577 10*3/uL — ABNORMAL HIGH (ref 150–400)
RBC: 3.91 MIL/uL — ABNORMAL LOW (ref 4.22–5.81)
RDW: 19.9 % — AB (ref 11.5–15.5)
WBC: 17.2 10*3/uL — ABNORMAL HIGH (ref 4.0–10.5)
nRBC: 1 /100 WBC — ABNORMAL HIGH

## 2013-12-09 LAB — URINALYSIS, ROUTINE W REFLEX MICROSCOPIC
Bilirubin Urine: NEGATIVE
Glucose, UA: NEGATIVE mg/dL
Hgb urine dipstick: NEGATIVE
Ketones, ur: NEGATIVE mg/dL
Leukocytes, UA: NEGATIVE
NITRITE: NEGATIVE
Protein, ur: NEGATIVE mg/dL
SPECIFIC GRAVITY, URINE: 1.01 (ref 1.005–1.030)
UROBILINOGEN UA: 0.2 mg/dL (ref 0.0–1.0)
pH: 5 (ref 5.0–8.0)

## 2013-12-09 LAB — PROTIME-INR
INR: 1.22 (ref 0.00–1.49)
Prothrombin Time: 15.4 seconds — ABNORMAL HIGH (ref 11.6–15.2)

## 2013-12-09 LAB — PRO B NATRIURETIC PEPTIDE: Pro B Natriuretic peptide (BNP): 5630 pg/mL — ABNORMAL HIGH (ref 0–125)

## 2013-12-09 NOTE — Progress Notes (Signed)
VASCULAR LAB PRELIMINARY  PRELIMINARY  PRELIMINARY  PRELIMINARY  Bilateral lower extremity venous duplex  completed.    Preliminary report:  Bilateral:  No evidence of DVT, superficial thrombosis, or Baker's Cyst.    Indianna Boran, RVT 12/09/2013, 9:14 AM

## 2013-12-09 NOTE — ED Notes (Signed)
Pt sats 97% while ambulating. MD aware

## 2013-12-09 NOTE — ED Notes (Signed)
Pt. Stated, I want my blood drawn, cause the doctor said there's something wrong.  I also want a blood specialist. they say my blood is low.

## 2013-12-09 NOTE — ED Notes (Signed)
Pt return from xray.

## 2013-12-09 NOTE — ED Provider Notes (Signed)
CSN: [id number]     Arrival date & time 12/09/13  0714 History   None    Chief Complaint  Patient presents with  . Labs Only     (Consider location/radiation/quality/duration/timing/severity/associated sxs/prior Treatment) HPI Comments: Patient arrives requesting "blood tests and blood pressure checked". He requests a "letter stating my blood work is normal". Per chart review patient has a history of abnormal CBCs and has been worked up by the hematology at [facility] for AML. He saw hematology at [facility] on July 16 and was recommended for a bone marrow biopsy which he refused.there is concern that he has acute leukemia or myelofibrosis. Patient denies any chest pain, shortness of breath, nausea or vomiting. No dizziness or lightheadedness. He has chronic lower extremity edema at baseline. This is unchanged. Denies any leg pain.  Patient has poor insight into his disease process and states he cannot have a bone marrow biopsy because someone in [place] 8 years ago told him his heart could not handle it and his numbers improved on their own. Her presents today requesting blood testing showing his numbers are normal.  Patient here with his grandson who does not know any of his medical history.  The history is provided by the patient and a relative.    Past Medical History  Diagnosis Date  . CAD (coronary artery disease)     dx elsewheer in past, no documentation. Non-ischemic myovue 2007  . CHF (congestive heart failure)     dx elsewhere, no documentation. Normal EF by previous echo. Trival AS needs f/u ECHO by 2012  . Hypertension   . Sleep apnea, obstructive     at some point used CPAP, was d/c  years ago  . Anemia   . History of thrombocytosis   . Allergic rhinitis   . Edema     R>L leg, u/s 5-12 neg for DVT  . Hemorrhoid   . Type II diabetes mellitus   . Sinus congestion   . Migraine     "once/wk at least" (07/11/2013)   Past Surgical History  Procedure Laterality Date  .  Toe surgery Right     "tried to straighten out big toe" (07/11/2013)   Family History  Problem Relation Age of Onset  . Colon cancer Neg Hx   . Prostate cancer Neg Hx   . Heart attack Neg Hx   . Diabetes Neg Hx   . Schizophrenia Son    History  Substance Use Topics  . Smoking status: Former Smoker -- 0.25 packs/day for 12 years    Types: Cigarettes    Quit date: 05/18/1966  . Smokeless tobacco: Never Used  . Alcohol Use: No    Review of Systems  Constitutional: Positive for fatigue. Negative for fever, activity change and appetite change.  Respiratory: Negative for cough and chest tightness.   Cardiovascular: Positive for leg swelling. Negative for chest pain.  Gastrointestinal: Negative for nausea, vomiting and abdominal pain.  Genitourinary: Negative for dysuria and hematuria.  Musculoskeletal: Negative for arthralgias, myalgias, neck pain and neck stiffness.  Skin: Negative for rash.  Neurological: Positive for weakness. Negative for dizziness and headaches.  A complete 10 system review of systems was obtained and all systems are negative except as noted in the HPI and PMH.      Allergies  Review of patient's allergies indicates no known allergies.  Home Medications   Prior to Admission medications   Medication Sig Start Date End Date Taking? Authorizing Provider  acetaminophen (  TYLENOL) 500 MG tablet Take 500-1,000 mg by mouth every 6 (six) hours as needed for moderate pain or headache.    Yes Historical Provider, MD  anagrelide (AGRYLIN) 0.5 MG capsule Take 0.5 mg by mouth daily.   Yes Historical Provider, MD  b complex vitamins capsule Take 1 capsule by mouth daily. 01/12/13  Yes Irene Pap, NP  bacitracin 500 UNIT/GM ointment Apply 1 application topically daily as needed for wound care.   Yes Historical Provider, MD  Ferrous Sulfate Dried 200 (65 FE) MG TABS Take 1 tablet by mouth daily.    Yes Historical Provider, MD  fluticasone (FLONASE) 50 MCG/ACT nasal  spray Place 2 sprays into both nostrils daily.    Yes Historical Provider, MD  folic acid (FOLVITE) 1 MG tablet Take 1 mg by mouth daily as needed.   Yes Historical Provider, MD  furosemide (LASIX) 40 MG tablet Take 120 mg by mouth daily at 6 PM.   Yes Historical Provider, MD  HYDROcodone-acetaminophen (NORCO/VICODIN) 5-325 MG per tablet Take 1 tablet by mouth every 6 (six) hours as needed for moderate pain.   Yes Historical Provider, MD  lisinopril (PRINIVIL,ZESTRIL) 20 MG tablet Take 20 mg by mouth daily.   Yes Historical Provider, MD  potassium chloride SA (K-DUR,KLOR-CON) 20 MEQ tablet Take 20 mEq by mouth daily.   Yes Historical Provider, MD   BP 116/78  Pulse 82  Temp(Src) 98 F (36.7 C) (Oral)  Resp 18  Ht 6' 3" (1.905 m)  Wt 276 lb (125.193 kg)  BMI 34.50 kg/m2  SpO2 100% Physical Exam  Nursing note and vitals reviewed. Constitutional: He is oriented to person, place, and time. He appears well-developed and well-nourished. No distress.  HENT:  Head: Normocephalic and atraumatic.  Mouth/Throat: Oropharynx is clear and moist. No oropharyngeal exudate.  Pale conjunctiva  Eyes: Conjunctivae and EOM are normal. Pupils are equal, round, and reactive to light.  Neck: Normal range of motion. Neck supple.  No meningismus.  Cardiovascular: Normal rate, regular rhythm, normal heart sounds and intact distal pulses.   No murmur heard. Pulmonary/Chest: Effort normal and breath sounds normal. No respiratory distress.  Abdominal: Soft. There is no tenderness. There is no rebound and no guarding.  Musculoskeletal: Normal range of motion. He exhibits edema. He exhibits no tenderness.  +3 pitting edema to his bilaterally  Neurological: He is alert and oriented to person, place, and time. No cranial nerve deficit. He exhibits normal muscle tone. Coordination normal.  No ataxia on finger to nose bilaterally. No pronator drift. 5/5 strength throughout. CN 2-12 intact. Negative Romberg. Equal grip  strength. Sensation intact. Gait is normal.   Skin: Skin is warm.  Psychiatric: He has a normal mood and affect. His behavior is normal.    ED Course  Procedures (including critical care time) Labs Review Labs Reviewed  CBC WITH DIFFERENTIAL - Abnormal; Notable for the following:    WBC 17.2 (*)    RBC 3.91 (*)    Hemoglobin 10.0 (*)    HCT 32.9 (*)    MCH 25.6 (*)    RDW 19.9 (*)    Platelets 577 (*)    Eosinophils Relative 9 (*)    nRBC 1 (*)    Neutro Abs 8.5 (*)    Lymphs Abs 5.8 (*)    Monocytes Absolute 1.2 (*)    Eosinophils Absolute 1.5 (*)    Basophils Absolute 0.2 (*)    All other components within normal limits  COMPREHENSIVE METABOLIC  PANEL - Abnormal; Notable for the following:    Glucose, Bld 115 (*)    GFR calc non Af Amer 55 (*)    GFR calc Af Amer 63 (*)    All other components within normal limits  PROTIME-INR - Abnormal; Notable for the following:    Prothrombin Time 15.4 (*)    All other components within normal limits  PRO B NATRIURETIC PEPTIDE - Abnormal; Notable for the following:    Pro B Natriuretic peptide (BNP) 5630.0 (*)    All other components within normal limits  URINALYSIS, ROUTINE W REFLEX MICROSCOPIC  PATHOLOGIST SMEAR REVIEW    Imaging Review Dg Chest 2 View  12/09/2013   CLINICAL DATA:  LABS ONLY  EXAM: CHEST - 2 VIEW  COMPARISON:  10/03/2013  FINDINGS: Mild cardiomegaly. Persistent elevation of the right diaphragmatic leaflet. Increased linear opacities in the left mid and lower lung. No effusion. Regional bones unremarkable.  IMPRESSION: 1. Borderline cardiomegaly. 2. Linear atelectasis or scarring in left lower lung.   Electronically Signed   By: Arne Cleveland M.D.   On: 12/09/2013 10:34     EKG Interpretation None      MDM   Final diagnoses:  Abnormal CBC   Patient with known history of blood dyscrasia requesting blood work. Denies any chest pain, shortness of breath, nausea or vomiting.  Hemoglobin is stable. White  count 17 Dopplers negative for DVT, no desaturation with ambulation.  Results discussed with patient's oncologist Dr, [name]. She agrees that patient has poor insight into his disease process. She recommends starting baby aspirin and followup with his PCP or [facility] if he is willing to have a bone marrow biopsy.  Patient requesting referral to different "blood specialist" as he wishes for additional opinions. He does not believe the results. I've told him he has seen 2 different blood specialists who told him the same thing.  Instructed to start baby ASA and follow up with hematologist.  Return precautions discussed.   [facility] notes 11/30/13, Dr. [name]: "Mr. Petrov returns to my clinic to discuss the results of the workup; however, he told me that he had received a letter from "a local hospital in [employer] [employer]", which requested him to go back to that hospital and have a second opinion obtained. He was not able to produce the letter; however, because of this, he refused to have a bone marrow biopsy and additional tests done during today's visit. He said that he would prefer to obtain that opinion. If he is not satisfied with the results of the workup in [place], he will return to our clinic. 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 He had 3% blasts in the peripheral blood, which was confirmed by flow cytometry. Mr. Ryant potentially could be a candidate for treatment with Jakafi; however, he decided not to discuss these issues and return to [place]. He is welcome to call our clinic and make an appointment in the future if he decides to do so."     Ezequiel Essex, MD 12/09/13 1727

## 2013-12-09 NOTE — Discharge Instructions (Signed)
Anemia, Nonspecific You need to follow up with the hematologist for further testing. Return to the ED if you develop new or worsening symptoms. Anemia is a condition in which the concentration of red blood cells or hemoglobin in the blood is below normal. Hemoglobin is a substance in red blood cells that carries oxygen to the tissues of the body. Anemia results in not enough oxygen reaching these tissues.  CAUSES  Common causes of anemia include:   Excessive bleeding. Bleeding may be internal or external. This includes excessive bleeding from periods (in women) or from the intestine.   Poor nutrition.   Chronic kidney, thyroid, and liver disease.  Bone marrow disorders that decrease red blood cell production.  Cancer and treatments for cancer.  HIV, AIDS, and their treatments.  Spleen problems that increase red blood cell destruction.  Blood disorders.  Excess destruction of red blood cells due to infection, medicines, and autoimmune disorders. SIGNS AND SYMPTOMS   Minor weakness.   Dizziness.   Headache.  Palpitations.   Shortness of breath, especially with exercise.   Paleness.  Cold sensitivity.  Indigestion.  Nausea.  Difficulty sleeping.  Difficulty concentrating. Symptoms may occur suddenly or they may develop slowly.  DIAGNOSIS  Additional blood tests are often needed. These help your health care provider determine the best treatment. Your health care provider will check your stool for blood and look for other causes of blood loss.  TREATMENT  Treatment varies depending on the cause of the anemia. Treatment can include:   Supplements of iron, vitamin B20, or folic acid.   Hormone medicines.   A blood transfusion. This may be needed if blood loss is severe.   Hospitalization. This may be needed if there is significant continual blood loss.   Dietary changes.  Spleen removal. HOME CARE INSTRUCTIONS Keep all follow-up appointments. It  often takes many weeks to correct anemia, and having your health care provider check on your condition and your response to treatment is very important. SEEK IMMEDIATE MEDICAL CARE IF:   You develop extreme weakness, shortness of breath, or chest pain.   You become dizzy or have trouble concentrating.  You develop heavy vaginal bleeding.   You develop a rash.   You have bloody or black, tarry stools.   You faint.   You vomit up blood.   You vomit repeatedly.   You have abdominal pain.  You have a fever or persistent symptoms for more than 2-3 days.   You have a fever and your symptoms suddenly get worse.   You are dehydrated.  MAKE SURE YOU:  Understand these instructions.  Will watch your condition.  Will get help right away if you are not doing well or get worse. Document Released: 06/11/2004 Document Revised: 01/04/2013 Document Reviewed: 10/28/2012 [facility] Patient Information 2015 [place], [place]. This information is not intended to replace advice given to you by your health care provider. Make sure you discuss any questions you have with your health care provider.

## 2013-12-10 ENCOUNTER — Other Ambulatory Visit: Payer: Self-pay | Admitting: Hematology and Oncology

## 2013-12-11 ENCOUNTER — Telehealth: Payer: Self-pay | Admitting: Hematology and Oncology

## 2013-12-11 NOTE — Telephone Encounter (Signed)
MD was notified and is aware...see below documentation

## 2013-12-11 NOTE — Telephone Encounter (Signed)
contacted pt and he said that he did not need the appt due to going to the hospital this weekend and they stated that everything was ok.

## 2013-12-13 LAB — PATHOLOGIST SMEAR REVIEW

## 2013-12-15 ENCOUNTER — Other Ambulatory Visit: Payer: Self-pay | Admitting: Internal Medicine

## 2013-12-16 ENCOUNTER — Other Ambulatory Visit: Payer: Self-pay | Admitting: Hematology and Oncology

## 2013-12-19 ENCOUNTER — Ambulatory Visit: Payer: Medicare Other | Admitting: Hematology and Oncology

## 2013-12-19 ENCOUNTER — Other Ambulatory Visit: Payer: Medicare Other

## 2013-12-28 ENCOUNTER — Ambulatory Visit (INDEPENDENT_AMBULATORY_CARE_PROVIDER_SITE_OTHER): Payer: Medicare Other | Admitting: Cardiovascular Disease

## 2013-12-28 ENCOUNTER — Encounter: Payer: Self-pay | Admitting: Cardiovascular Disease

## 2013-12-28 VITALS — BP 150/70 | HR 80 | Ht 75.0 in | Wt 257.0 lb

## 2013-12-28 DIAGNOSIS — R609 Edema, unspecified: Secondary | ICD-10-CM | POA: Diagnosis not present

## 2013-12-28 DIAGNOSIS — I2510 Atherosclerotic heart disease of native coronary artery without angina pectoris: Secondary | ICD-10-CM | POA: Diagnosis not present

## 2013-12-28 DIAGNOSIS — I10 Essential (primary) hypertension: Secondary | ICD-10-CM

## 2013-12-28 NOTE — Assessment & Plan Note (Signed)
Stable with no angina and good activity level.  Continue medical Rx  

## 2013-12-28 NOTE — Patient Instructions (Signed)
**Note De-identified  Obfuscation** Your physician recommends that you continue on your current medications as directed. Please refer to the Current Medication list given to you today.  Your physician wants you to follow-up in: 6 months. You will receive a reminder letter in the mail two months in advance. If you don't receive a letter, please call our office to schedule the follow-up appointment.  

## 2013-12-28 NOTE — Assessment & Plan Note (Signed)
Recent duplex no thrombosis   Continue current dose of lasix Low sodium diet f/u primary Not related to heart problems

## 2013-12-28 NOTE — Assessment & Plan Note (Signed)
Well controlled.  Continue current medications and low sodium Dash type diet.    

## 2013-12-28 NOTE — Progress Notes (Signed)
Patient ID: Juan [name], male   DOB: [birth year], 73 y.o.   MRN: [id number] Holt is seen today for F/U of HTN, and edema. He carries a Dx of CAD and CHF. There has never been any documentation of this. He indicated these problems when he moved here from [place]> He had a normal myovue and echo in 2007. He is a poor historian but denies PND, orthopnea and his edema is improved. He has mild chronic fatigue and exertional dyspnea. He denies cough or sputum. He has been compliant with his meds. His son lives with him and has some psycholigical issues. This is a little stressful for him    ECG reviewed today NSR 70 Normal  Compliant with meds no new heart issues Has anemia being seen by Dr Larose Kells  Echo 1/15 Study Conclusions  Normal EF   - Left ventricle: The cavity size was normal. There was mild focal basal and mild concentric hypertrophy of the septum. Systolic function was normal. The estimated ejection fraction was in the range of 55% to 60%. Wall motion was normal; there were no regional wall motion abnormalities. Doppler parameters are consistent with abnormal left ventricular relaxation (grade 1 diastolic dysfunction). Doppler parameters are consistent with mildly elevated ventricular filling pressure. - Mitral valve: Calcified annulus. Mildly thickened leaflets . - Left atrium: The atrium was moderately dilated. - Right ventricle: The cavity size was mildly dilated. Wall thickness was normal. Systolic pressure was increased. - Right atrium: The atrium was mildly dilated. - Atrial septum: No defect or patent foramen ovale was identified. - Pulmonary arteries: PA peak pressure: 31mm Hg (S). - Pericardium, extracardiac: A trivial pericardial effusion was identified.   12/09/13  LE venous duplex no thrombosis   ROS: Denies fever, malais, weight loss, blurry vision, decreased visual acuity, cough, sputum, SOB, hemoptysis, pleuritic pain, palpitaitons, heartburn, abdominal pain, melena, lower  extremity edema, claudication, or rash.  All other systems reviewed and negative  General: Affect appropriate Obese black male  HEENT: normal Neck supple with no adenopathy JVP normal no bruits no thyromegaly Lungs clear with no wheezing and good diaphragmatic motion Heart:  S1/S2 no murmur, no rub, gallop or click PMI normal Abdomen: benighn, BS positve, no tenderness, no AAA no bruit.  No HSM or HJR Distal pulses intact with no bruits Plus 2 bilateral  edema Neuro non-focal Skin warm and dry No muscular weakness   Current Outpatient Prescriptions  Medication Sig Dispense Refill  . acetaminophen (TYLENOL) 500 MG tablet Take 500-1,000 mg by mouth every 6 (six) hours as needed for moderate pain or headache.       . anagrelide (AGRYLIN) 0.5 MG capsule Take 0.5 mg by mouth daily.      Marland Kitchen b complex vitamins capsule Take 1 capsule by mouth daily.  30 capsule  3  . bacitracin 500 UNIT/GM ointment Apply 1 application topically daily as needed for wound care.      . Ferrous Sulfate Dried 200 (65 FE) MG TABS Take 1 tablet by mouth daily.       . fluticasone (FLONASE) 50 MCG/ACT nasal spray Place 2 sprays into both nostrils daily.       . folic acid (FOLVITE) 1 MG tablet Take 1 mg by mouth daily as needed.      . furosemide (LASIX) 40 MG tablet TAKE 3 TABLETS BY MOUTH EVERY DAY  90 tablet  1  . HYDROcodone-acetaminophen (NORCO/VICODIN) 5-325 MG per tablet Take 1 tablet by mouth every 6 (six) hours as  needed for moderate pain.      Marland Kitchen lisinopril (PRINIVIL,ZESTRIL) 20 MG tablet TAKE ONE TABLET BY MOUTH ONCE DAILY  30 tablet  6  . potassium chloride SA (K-DUR,KLOR-CON) 20 MEQ tablet TAKE 1 TABLET BY MOUTH EVERY DAY  30 tablet  6   No current facility-administered medications for this visit.    Allergies  Review of patient's allergies indicates no known allergies.  Electrocardiogram:  SR rate 68 LVH PAC  2014  tidat sr rate 80  T wave inversions inf/ and V1-3  Assessment and Plan

## 2014-01-25 ENCOUNTER — Encounter (HOSPITAL_COMMUNITY): Payer: Self-pay | Admitting: Emergency Medicine

## 2014-01-25 ENCOUNTER — Inpatient Hospital Stay (HOSPITAL_COMMUNITY)
Admission: EM | Admit: 2014-01-25 | Discharge: 2014-01-27 | DRG: 603 | Disposition: A | Discharge status: Home / Self Care | Payer: Medicare Other | Attending: Internal Medicine | Admitting: Internal Medicine

## 2014-01-25 DIAGNOSIS — Z6831 Body mass index (BMI) 31.0-31.9, adult: Secondary | ICD-10-CM | POA: Diagnosis not present

## 2014-01-25 DIAGNOSIS — R609 Edema, unspecified: Secondary | ICD-10-CM

## 2014-01-25 DIAGNOSIS — I872 Venous insufficiency (chronic) (peripheral): Secondary | ICD-10-CM | POA: Diagnosis present

## 2014-01-25 DIAGNOSIS — D649 Anemia, unspecified: Secondary | ICD-10-CM

## 2014-01-25 DIAGNOSIS — Z87891 Personal history of nicotine dependence: Secondary | ICD-10-CM

## 2014-01-25 DIAGNOSIS — I5032 Chronic diastolic (congestive) heart failure: Secondary | ICD-10-CM | POA: Diagnosis present

## 2014-01-25 DIAGNOSIS — I129 Hypertensive chronic kidney disease with stage 1 through stage 4 chronic kidney disease, or unspecified chronic kidney disease: Secondary | ICD-10-CM | POA: Diagnosis present

## 2014-01-25 DIAGNOSIS — Z9119 Patient's noncompliance with other medical treatment and regimen: Secondary | ICD-10-CM

## 2014-01-25 DIAGNOSIS — D638 Anemia in other chronic diseases classified elsewhere: Secondary | ICD-10-CM | POA: Diagnosis present

## 2014-01-25 DIAGNOSIS — N183 Chronic kidney disease, stage 3 unspecified: Secondary | ICD-10-CM | POA: Diagnosis present

## 2014-01-25 DIAGNOSIS — D47Z9 Other specified neoplasms of uncertain behavior of lymphoid, hematopoietic and related tissue: Secondary | ICD-10-CM | POA: Diagnosis present

## 2014-01-25 DIAGNOSIS — R21 Rash and other nonspecific skin eruption: Secondary | ICD-10-CM | POA: Diagnosis present

## 2014-01-25 DIAGNOSIS — E669 Obesity, unspecified: Secondary | ICD-10-CM | POA: Diagnosis present

## 2014-01-25 DIAGNOSIS — I2510 Atherosclerotic heart disease of native coronary artery without angina pectoris: Secondary | ICD-10-CM

## 2014-01-25 DIAGNOSIS — I517 Cardiomegaly: Secondary | ICD-10-CM

## 2014-01-25 DIAGNOSIS — J309 Allergic rhinitis, unspecified: Secondary | ICD-10-CM

## 2014-01-25 DIAGNOSIS — L0390 Cellulitis, unspecified: Secondary | ICD-10-CM | POA: Diagnosis present

## 2014-01-25 DIAGNOSIS — G43909 Migraine, unspecified, not intractable, without status migrainosus: Secondary | ICD-10-CM | POA: Diagnosis present

## 2014-01-25 DIAGNOSIS — Z91199 Patient's noncompliance with other medical treatment and regimen due to unspecified reason: Secondary | ICD-10-CM

## 2014-01-25 DIAGNOSIS — D759 Disease of blood and blood-forming organs, unspecified: Secondary | ICD-10-CM

## 2014-01-25 DIAGNOSIS — L02419 Cutaneous abscess of limb, unspecified: Secondary | ICD-10-CM | POA: Diagnosis present

## 2014-01-25 DIAGNOSIS — R7303 Prediabetes: Secondary | ICD-10-CM

## 2014-01-25 DIAGNOSIS — I509 Heart failure, unspecified: Secondary | ICD-10-CM | POA: Diagnosis present

## 2014-01-25 DIAGNOSIS — R634 Abnormal weight loss: Secondary | ICD-10-CM

## 2014-01-25 DIAGNOSIS — Z0000 Encounter for general adult medical examination without abnormal findings: Secondary | ICD-10-CM

## 2014-01-25 DIAGNOSIS — D7581 Myelofibrosis: Secondary | ICD-10-CM | POA: Diagnosis present

## 2014-01-25 DIAGNOSIS — I10 Essential (primary) hypertension: Secondary | ICD-10-CM | POA: Diagnosis not present

## 2014-01-25 DIAGNOSIS — N400 Enlarged prostate without lower urinary tract symptoms: Secondary | ICD-10-CM

## 2014-01-25 DIAGNOSIS — D72829 Elevated white blood cell count, unspecified: Secondary | ICD-10-CM

## 2014-01-25 DIAGNOSIS — G4733 Obstructive sleep apnea (adult) (pediatric): Secondary | ICD-10-CM | POA: Diagnosis present

## 2014-01-25 DIAGNOSIS — E119 Type 2 diabetes mellitus without complications: Secondary | ICD-10-CM | POA: Diagnosis present

## 2014-01-25 DIAGNOSIS — L03119 Cellulitis of unspecified part of limb: Secondary | ICD-10-CM | POA: Diagnosis not present

## 2014-01-25 DIAGNOSIS — Z23 Encounter for immunization: Secondary | ICD-10-CM | POA: Diagnosis not present

## 2014-01-25 DIAGNOSIS — M7989 Other specified soft tissue disorders: Secondary | ICD-10-CM | POA: Diagnosis not present

## 2014-01-25 DIAGNOSIS — J984 Other disorders of lung: Secondary | ICD-10-CM | POA: Diagnosis not present

## 2014-01-25 HISTORY — DX: Shortness of breath: R06.02

## 2014-01-25 LAB — I-STAT CG4 LACTIC ACID, ED: Lactic Acid, Venous: 0.76 mmol/L (ref 0.5–2.2)

## 2014-01-25 LAB — CBC WITH DIFFERENTIAL/PLATELET
BAND NEUTROPHILS: 15 % — AB (ref 0–10)
BLASTS: 10 %
Basophils Absolute: 0.8 10*3/uL — ABNORMAL HIGH (ref 0.0–0.1)
Basophils Relative: 6 % — ABNORMAL HIGH (ref 0–1)
Eosinophils Absolute: 0.5 10*3/uL (ref 0.0–0.7)
Eosinophils Relative: 4 % (ref 0–5)
HCT: 31.6 % — ABNORMAL LOW (ref 39.0–52.0)
Hemoglobin: 10 g/dL — ABNORMAL LOW (ref 13.0–17.0)
LYMPHS PCT: 23 % (ref 12–46)
Lymphs Abs: 3.1 10*3/uL (ref 0.7–4.0)
MCH: 25.6 pg — ABNORMAL LOW (ref 26.0–34.0)
MCHC: 31.6 g/dL (ref 30.0–36.0)
MCV: 81 fL (ref 78.0–100.0)
Metamyelocytes Relative: 2 %
Monocytes Absolute: 1.1 10*3/uL — ABNORMAL HIGH (ref 0.1–1.0)
Monocytes Relative: 8 % (ref 3–12)
Myelocytes: 9 %
NEUTROS ABS: 6.7 10*3/uL (ref 1.7–7.7)
NEUTROS PCT: 23 % — AB (ref 43–77)
PROMYELOCYTES ABS: 0 %
Platelets: 311 10*3/uL (ref 150–400)
RBC: 3.9 MIL/uL — ABNORMAL LOW (ref 4.22–5.81)
RDW: 19 % — AB (ref 11.5–15.5)
WBC: 13.6 10*3/uL — ABNORMAL HIGH (ref 4.0–10.5)
nRBC: 0 /100 WBC

## 2014-01-25 LAB — COMPREHENSIVE METABOLIC PANEL
ALBUMIN: 3.2 g/dL — AB (ref 3.5–5.2)
ALK PHOS: 114 U/L (ref 39–117)
ALT: 9 U/L (ref 0–53)
AST: 27 U/L (ref 0–37)
Anion gap: 12 (ref 5–15)
BUN: 34 mg/dL — ABNORMAL HIGH (ref 6–23)
CO2: 26 mEq/L (ref 19–32)
Calcium: 9.6 mg/dL (ref 8.4–10.5)
Chloride: 104 mEq/L (ref 96–112)
Creatinine, Ser: 1.18 mg/dL (ref 0.50–1.35)
GFR calc Af Amer: 69 mL/min — ABNORMAL LOW (ref >90)
GFR calc non Af Amer: 60 mL/min — ABNORMAL LOW (ref >90)
Glucose, Bld: 101 mg/dL — ABNORMAL HIGH (ref 70–99)
POTASSIUM: 4.5 meq/L (ref 3.7–5.3)
SODIUM: 142 meq/L (ref 137–147)
Total Bilirubin: 0.2 mg/dL — ABNORMAL LOW (ref 0.3–1.2)
Total Protein: 6.4 g/dL (ref 6.0–8.3)

## 2014-01-25 LAB — URINALYSIS, ROUTINE W REFLEX MICROSCOPIC
BILIRUBIN URINE: NEGATIVE
Glucose, UA: NEGATIVE mg/dL
HGB URINE DIPSTICK: NEGATIVE
KETONES UR: NEGATIVE mg/dL
Leukocytes, UA: NEGATIVE
Nitrite: NEGATIVE
PH: 6 (ref 5.0–8.0)
Protein, ur: NEGATIVE mg/dL
SPECIFIC GRAVITY, URINE: 1.013 (ref 1.005–1.030)
Urobilinogen, UA: 0.2 mg/dL (ref 0.0–1.0)

## 2014-01-25 LAB — PRO B NATRIURETIC PEPTIDE: PRO B NATRI PEPTIDE: 828.2 pg/mL — AB (ref 0–125)

## 2014-01-25 MED ORDER — FUROSEMIDE 80 MG PO TABS
160.0000 mg | ORAL_TABLET | Freq: Every day | ORAL | Status: DC
  Administered 2014-01-25 – 2014-01-26 (×2): 160 mg via ORAL
  Filled 2014-01-25 (×3): qty 2

## 2014-01-25 MED ORDER — DIPHENHYDRAMINE HCL 25 MG PO CAPS
25.0000 mg | ORAL_CAPSULE | Freq: Four times a day (QID) | ORAL | Status: DC | PRN
  Administered 2014-01-25 – 2014-01-26 (×2): 25 mg via ORAL
  Filled 2014-01-25 (×2): qty 1

## 2014-01-25 MED ORDER — HYDROCODONE-ACETAMINOPHEN 5-325 MG PO TABS
1.0000 | ORAL_TABLET | ORAL | Status: DC | PRN
  Administered 2014-01-25: 2 via ORAL
  Filled 2014-01-25: qty 2

## 2014-01-25 MED ORDER — DIPHENHYDRAMINE HCL 50 MG/ML IJ SOLN
25.0000 mg | Freq: Once | INTRAMUSCULAR | Status: AC
  Administered 2014-01-25: 25 mg via INTRAVENOUS
  Filled 2014-01-25: qty 1

## 2014-01-25 MED ORDER — ENOXAPARIN SODIUM 60 MG/0.6ML ~~LOC~~ SOLN
60.0000 mg | SUBCUTANEOUS | Status: DC
Start: 2014-01-25 — End: 2014-01-27
  Administered 2014-01-25 – 2014-01-26 (×2): 60 mg via SUBCUTANEOUS
  Filled 2014-01-25 (×3): qty 0.6

## 2014-01-25 MED ORDER — VANCOMYCIN HCL IN DEXTROSE 1-5 GM/200ML-% IV SOLN
1000.0000 mg | Freq: Two times a day (BID) | INTRAVENOUS | Status: DC
  Administered 2014-01-26 – 2014-01-27 (×3): 1000 mg via INTRAVENOUS
  Filled 2014-01-25 (×4): qty 200

## 2014-01-25 MED ORDER — VANCOMYCIN HCL IN DEXTROSE 1-5 GM/200ML-% IV SOLN: 1000.0000 mg | Freq: Once | INTRAVENOUS | Status: DC

## 2014-01-25 MED ORDER — ACETAMINOPHEN 325 MG PO TABS: 650.0000 mg | ORAL_TABLET | Freq: Four times a day (QID) | ORAL | Status: DC | PRN

## 2014-01-25 MED ORDER — SODIUM CHLORIDE 0.9 % IV SOLN
250.0000 mL | INTRAVENOUS | Status: DC | PRN
Start: 2014-01-25 — End: 2014-01-27

## 2014-01-25 MED ORDER — MORPHINE SULFATE 4 MG/ML IJ SOLN
4.0000 mg | Freq: Once | INTRAMUSCULAR | Status: AC
  Administered 2014-01-25: 4 mg via INTRAVENOUS
  Filled 2014-01-25: qty 1

## 2014-01-25 MED ORDER — INFLUENZA VAC SPLIT QUAD 0.5 ML IM SUSY
0.5000 mL | PREFILLED_SYRINGE | INTRAMUSCULAR | Status: AC
  Administered 2014-01-26: 0.5 mL via INTRAMUSCULAR
  Filled 2014-01-25: qty 0.5

## 2014-01-25 MED ORDER — POTASSIUM CHLORIDE CRYS ER 20 MEQ PO TBCR
20.0000 meq | EXTENDED_RELEASE_TABLET | Freq: Every day | ORAL | Status: DC
  Administered 2014-01-25 – 2014-01-27 (×3): 20 meq via ORAL
  Filled 2014-01-25 (×3): qty 1

## 2014-01-25 MED ORDER — ACETAMINOPHEN 650 MG RE SUPP: 650.0000 mg | Freq: Four times a day (QID) | RECTAL | Status: DC | PRN

## 2014-01-25 MED ORDER — ANAGRELIDE HCL 0.5 MG PO CAPS
0.5000 mg | ORAL_CAPSULE | Freq: Every day | ORAL | Status: DC
  Administered 2014-01-25 – 2014-01-27 (×3): 0.5 mg via ORAL
  Filled 2014-01-25 (×4): qty 1

## 2014-01-25 MED ORDER — SODIUM CHLORIDE 0.9 % IJ SOLN
3.0000 mL | Freq: Two times a day (BID) | INTRAMUSCULAR | Status: DC
Start: 2014-01-25 — End: 2014-01-27
  Administered 2014-01-26: 3 mL via INTRAVENOUS

## 2014-01-25 MED ORDER — FERROUS SULFATE 325 (65 FE) MG PO TABS
325.0000 mg | ORAL_TABLET | Freq: Every day | ORAL | Status: DC
  Administered 2014-01-25 – 2014-01-27 (×3): 325 mg via ORAL
  Filled 2014-01-25 (×4): qty 1

## 2014-01-25 MED ORDER — PIPERACILLIN-TAZOBACTAM 3.375 G IVPB 30 MIN
3.3750 g | Freq: Once | INTRAVENOUS | Status: AC
  Administered 2014-01-25: 3.375 g via INTRAVENOUS
  Filled 2014-01-25: qty 50

## 2014-01-25 MED ORDER — FLUTICASONE PROPIONATE 50 MCG/ACT NA SUSP
2.0000 | Freq: Every day | NASAL | Status: DC
  Administered 2014-01-25 – 2014-01-27 (×3): 2 via NASAL
  Filled 2014-01-25: qty 16

## 2014-01-25 MED ORDER — SODIUM CHLORIDE 0.9 % IJ SOLN: 3.0000 mL | INTRAMUSCULAR | Status: DC | PRN

## 2014-01-25 MED ORDER — FERROUS SULFATE DRIED 200 (65 FE) MG PO TABS
1.0000 | ORAL_TABLET | Freq: Every day | ORAL | Status: DC
Start: 2014-01-25 — End: 2014-01-25

## 2014-01-25 MED ORDER — LISINOPRIL 20 MG PO TABS
20.0000 mg | ORAL_TABLET | Freq: Every day | ORAL | Status: DC
  Administered 2014-01-25 – 2014-01-27 (×3): 20 mg via ORAL
  Filled 2014-01-25 (×3): qty 1

## 2014-01-25 MED ORDER — VANCOMYCIN HCL 10 G IV SOLR
2000.0000 mg | Freq: Once | INTRAVENOUS | Status: AC
  Administered 2014-01-25: 2000 mg via INTRAVENOUS
  Filled 2014-01-25: qty 2000

## 2014-01-25 MED ORDER — MORPHINE SULFATE 2 MG/ML IJ SOLN: 2.0000 mg | INTRAMUSCULAR | Status: DC | PRN

## 2014-01-25 MED ORDER — HYDROCODONE-ACETAMINOPHEN 5-325 MG PO TABS: 1.0000 | ORAL_TABLET | Freq: Four times a day (QID) | ORAL | Status: DC | PRN

## 2014-01-25 MED ORDER — PIPERACILLIN-TAZOBACTAM 3.375 G IVPB
3.3750 g | Freq: Three times a day (TID) | INTRAVENOUS | Status: DC
  Administered 2014-01-25 – 2014-01-26 (×2): 3.375 g via INTRAVENOUS
  Filled 2014-01-25 (×4): qty 50

## 2014-01-25 MED ORDER — FOLIC ACID 1 MG PO TABS
1.0000 mg | ORAL_TABLET | Freq: Every day | ORAL | Status: DC
  Administered 2014-01-25 – 2014-01-27 (×3): 1 mg via ORAL
  Filled 2014-01-25 (×3): qty 1

## 2014-01-25 NOTE — Progress Notes (Signed)
Utilization review completed.  

## 2014-01-25 NOTE — ED Notes (Signed)
Juan [name] in main lab states BNP will be added on.

## 2014-01-25 NOTE — Consult Note (Signed)
PHARMACY CONSULT NOTE - INITIAL  Pharmacy Consult for :   Zosyn, Vancomycin Indication:  Bilateral Lower Extremity Cellulitis  Allergies: No Known Allergies  Patient Measurements: Height: 6' 3.2" (191 cm) Weight: 257 lb 0.9 oz (116.6 kg) IBW/kg (Calculated) : 84.95 Dosing Wt:  116 kg  Vital Signs: BP 146/66  Pulse 80  Temp(Src) 98 F (36.7 C) (Oral)  Resp 18  Ht 6' 3.2" (1.91 m)  Wt 257 lb 0.9 oz (116.6 kg)  BMI 31.96 kg/m2  SpO2 98%  Labs:  Recent Labs  01/25/14 1115  WBC 13.6*  HGB 10.0*  PLT 311  CREATININE 1.18   Estimated Creatinine Clearance: 78.1 ml/min (by C-G formula based on Cr of 1.18).   Microbiology: No results found for this or any previous visit (from the past 720 hour(s)). -07/11/2013 patient had wound culture which showed Pseudomonas and Staphylococcus  Medical/Surgical History: Past Medical History  Diagnosis Date  . CAD (coronary artery disease)     dx elsewheer in past, no documentation. Non-ischemic myovue 2007  . CHF (congestive heart failure)     dx elsewhere, no documentation. Normal EF by previous echo. Trival AS needs f/u ECHO by 2012  . Hypertension   . Sleep apnea, obstructive     at some point used CPAP, was d/c  years ago  . Anemia   . History of thrombocytosis   . Allergic rhinitis   . Edema     R>L leg, u/s 5-12 neg for DVT  . Hemorrhoid   . Type II diabetes mellitus   . Sinus congestion   . Migraine     "once/wk at least" (07/11/2013)   Past Surgical History  Procedure Laterality Date  . Toe surgery Right     "tried to straighten out big toe" (07/11/2013)    Current Medication[s] Include: Medication PTA: Prescriptions prior to admission  Medication Sig Dispense Refill  . acetaminophen (TYLENOL) 500 MG tablet Take 500-1,000 mg by mouth every 6 (six) hours as needed for moderate pain or headache.       . anagrelide (AGRYLIN) 0.5 MG capsule Take 0.5 mg by mouth daily.      Marland Kitchen b complex vitamins capsule Take 1  capsule by mouth daily.  30 capsule  3  . bacitracin 500 UNIT/GM ointment Apply 1 application topically daily as needed for wound care.      . Ferrous Sulfate Dried 200 (65 FE) MG TABS Take 1 tablet by mouth daily.       . fluticasone (FLONASE) 50 MCG/ACT nasal spray Place 2 sprays into both nostrils daily.       . folic acid (FOLVITE) 1 MG tablet Take 1 mg by mouth daily.       . furosemide (LASIX) 40 MG tablet Take 160 mg by mouth daily.      Marland Kitchen HYDROcodone-acetaminophen (NORCO/VICODIN) 5-325 MG per tablet Take 1 tablet by mouth every 6 (six) hours as needed for moderate pain.      Marland Kitchen lisinopril (PRINIVIL,ZESTRIL) 20 MG tablet Take 20 mg by mouth daily.      . potassium chloride SA (K-DUR,KLOR-CON) 20 MEQ tablet Take 20 mEq by mouth daily.         Scheduled:  Scheduled:  . anagrelide  0.5 mg Oral Daily  . enoxaparin (LOVENOX) injection  60 mg Subcutaneous Q24H  . ferrous sulfate  325 mg Oral Q breakfast  . fluticasone  2 spray Each Nare Daily  . folic acid  1 mg Oral Daily  .  furosemide  160 mg Oral Daily  . lisinopril  20 mg Oral Daily  . potassium chloride SA  20 mEq Oral Daily  . sodium chloride  3 mL Intravenous Q12H    Infusion[s]: Infusions:    Antibiotic[s]: Anti-infectives   Start     Dose/Rate Route Frequency Ordered Stop   01/25/14 1400  vancomycin (VANCOCIN) IVPB 1000 mg/200 mL premix  Status:  Discontinued     1,000 mg 200 mL/hr over 60 Minutes Intravenous  Once 01/25/14 1351 01/25/14 1554   01/25/14 1345  piperacillin-tazobactam (ZOSYN) IVPB 3.375 g     3.375 g 100 mL/hr over 30 Minutes Intravenous  Once 01/25/14 1337 01/25/14 1438      Assessment:  73 y.o. male w/PMH chronic diastolic heart failure, hypertension, thrombocythemia, chronic venous stasis and lower extremity edema, Dopplers Negative for DVT.  Vancomycin and Zosyn to be started for B-LE Cellulitis.  CrCl ~ 78.  Pt dosing weight 116 kg.  Currently Afebrile.  WBC 13.6.  Goal of Therapy:    Vancomycin trough level 10-15 mcg/ml Zosyn selected for presumed Cellulitis infection/cultures and adjusted for renal function.   Plan:  1. Begin Zosyn 3.375 gm IV q 8 hours, each dose to infuse over 4 hours 2. Vancomycin 2000 mg x 1, then 1 gm IV q 12 hours. 3. Follow up SCr, UOP, cultures, SS Levels, clinical course and adjust as clinically indicated.  Phillipe Clemon, Craig Guess,  Pharm.D,    9/10/20154:02 PM

## 2014-01-25 NOTE — ED Notes (Signed)
Pt c/o bilateral leg pain; pt noted to have blistering wound to left leg with redness; pt sts some itching as well

## 2014-01-25 NOTE — Progress Notes (Signed)
*  Preliminary Results* Bilateral lower extremity venous duplex completed. Visualized veins of bilateral lower extremities are negative for deep vein thrombosis. There is no evidence of Baker's cyst bilaterally.  01/25/2014  Estella Husk, RVT

## 2014-01-25 NOTE — Progress Notes (Signed)
Juan [name] [id number] Code Status: Full  Admission Data: 01/25/2014 4:13 PM Attending Provider:  Ree Kida BWL:SLHT Larose Kells, MD Consults/ Treatment Team:    Juan [name] is a 73 y.o. male patient admitted from ED awake, alert - oriented  X 3 - no acute distress noted.  VSS - Blood pressure 146/66, pulse 80, temperature 98 F (36.7 C), temperature source Oral, resp. rate 18, height 6' 3.2" (1.91 m), weight 116.6 kg (257 lb 0.9 oz), SpO2 98.00%.    IV in place, occlusive dsg intact without redness.  Orientation to room, and floor completed with information packet given to patient/family.  Patient declined safety video at this time.  Admission INP armband ID verified with patient/family, and in place.   SR up x 2, fall assessment complete, with patient and family able to verbalize understanding of risk associated with falls, and verbalized understanding to call nsg before up out of bed.  Call light within reach, patient able to voice, and demonstrate understanding.  Lower extremity cellulitis.       Will cont to eval and treat per MD orders.  [name], RN 01/25/2014 4:13 PM

## 2014-01-25 NOTE — ED Notes (Signed)
Heart healthy tray ordered for pt. Spoke with monica.

## 2014-01-25 NOTE — ED Notes (Signed)
Lab results reported to Dr.Yao. 

## 2014-01-25 NOTE — ED Provider Notes (Signed)
CSN: [id number]     Arrival date & time 01/25/14  1036 History   First MD Initiated Contact with Patient 01/25/14 1107     Chief Complaint  Patient presents with  . Leg Pain     (Consider location/radiation/quality/duration/timing/severity/associated sxs/prior Treatment) The history is provided by the patient.  Antoni Stefan is a 73 y.o. male hx of CAD, CHF, HTN, thrombocytosis, DM, chronic leg swelling, here with worsening leg swelling and redness. Started with worsening leg swelling and redness for the last week. He noticed that rash broke out but no blistering, just drainage from the wound. Also diffuse itchiness in his entire body as well. Denies chest pain or shortness of breath but has history of CHF. Was admitted in January for cellulitis of leg secondary to pseudomonas. Has previous US several months ago that showed no DVT. Denies fevers.    Past Medical History  Diagnosis Date  . CAD (coronary artery disease)     dx elsewheer in past, no documentation. Non-ischemic myovue 2007  . CHF (congestive heart failure)     dx elsewhere, no documentation. Normal EF by previous echo. Trival AS needs f/u ECHO by 2012  . Hypertension   . Sleep apnea, obstructive     at some point used CPAP, was d/c  years ago  . Anemia   . History of thrombocytosis   . Allergic rhinitis   . Edema     R>L leg, u/s 5-12 neg for DVT  . Hemorrhoid   . Type II diabetes mellitus   . Sinus congestion   . Migraine     "once/wk at least" (07/11/2013)   Past Surgical History  Procedure Laterality Date  . Toe surgery Right     "tried to straighten out big toe" (07/11/2013)   Family History  Problem Relation Age of Onset  . Colon cancer Neg Hx   . Prostate cancer Neg Hx   . Heart attack Neg Hx   . Diabetes Neg Hx   . Schizophrenia Son    History  Substance Use Topics  . Smoking status: Former Smoker -- 0.25 packs/day for 12 years    Types: Cigarettes    Quit date: 05/18/1966  . Smokeless tobacco: Never  Used  . Alcohol Use: No    Review of Systems  Cardiovascular: Positive for leg swelling.  Musculoskeletal:       Leg pain, cellulitis   All other systems reviewed and are negative.     Allergies  Review of patient's allergies indicates no known allergies.  Home Medications   Prior to Admission medications   Medication Sig Start Date End Date Taking? Authorizing Provider  acetaminophen (TYLENOL) 500 MG tablet Take 500-1,000 mg by mouth every 6 (six) hours as needed for moderate pain or headache.    Yes Historical Provider, MD  anagrelide (AGRYLIN) 0.5 MG capsule Take 0.5 mg by mouth daily.   Yes Historical Provider, MD  b complex vitamins capsule Take 1 capsule by mouth daily. 01/12/13  Yes Irene Pap, NP  bacitracin 500 UNIT/GM ointment Apply 1 application topically daily as needed for wound care.   Yes Historical Provider, MD  Ferrous Sulfate Dried 200 (65 FE) MG TABS Take 1 tablet by mouth daily.    Yes Historical Provider, MD  fluticasone (FLONASE) 50 MCG/ACT nasal spray Place 2 sprays into both nostrils daily.    Yes Historical Provider, MD  folic acid (FOLVITE) 1 MG tablet Take 1 mg by mouth daily.  Yes Historical Provider, MD  furosemide (LASIX) 40 MG tablet Take 160 mg by mouth daily.   Yes Historical Provider, MD  HYDROcodone-acetaminophen (NORCO/VICODIN) 5-325 MG per tablet Take 1 tablet by mouth every 6 (six) hours as needed for moderate pain.   Yes Historical Provider, MD  lisinopril (PRINIVIL,ZESTRIL) 20 MG tablet Take 20 mg by mouth daily.   Yes Historical Provider, MD  potassium chloride SA (K-DUR,KLOR-CON) 20 MEQ tablet Take 20 mEq by mouth daily.   Yes Historical Provider, MD   BP 130/67  Pulse 75  Temp(Src) 98.1 F (36.7 C) (Oral)  Resp 16  SpO2 97% Physical Exam  Nursing note and vitals reviewed. Constitutional: He is oriented to person, place, and time.  Uncomfortable   HENT:  Head: Normocephalic.  Mouth/Throat: Oropharynx is clear and moist.   Eyes: Conjunctivae and EOM are normal. Pupils are equal, round, and reactive to light.  Neck: Normal range of motion. Neck supple.  Cardiovascular: Normal rate, regular rhythm and normal heart sounds.   Pulmonary/Chest: Effort normal and breath sounds normal. No respiratory distress. He has no wheezes. He has no rales.  Abdominal: Soft. Bowel sounds are normal. He exhibits no distension. There is no tenderness. There is no rebound.  Musculoskeletal:  + bilateral leg swelling, worse on R. Some venous stasis changes, + cellulitis on R leg   Neurological: He is alert and oriented to person, place, and time. No cranial nerve deficit. Coordination normal.  Skin: Skin is warm and dry.  Psychiatric: He has a normal mood and affect. His behavior is normal. Judgment and thought content normal.    ED Course  Procedures (including critical care time) Labs Review Labs Reviewed  CBC WITH DIFFERENTIAL - Abnormal; Notable for the following:    WBC 13.6 (*)    RBC 3.90 (*)    Hemoglobin 10.0 (*)    HCT 31.6 (*)    MCH 25.6 (*)    RDW 19.0 (*)    Neutrophils Relative % 23 (*)    Basophils Relative 6 (*)    Band Neutrophils 15 (*)    Monocytes Absolute 1.1 (*)    Basophils Absolute 0.8 (*)    All other components within normal limits  COMPREHENSIVE METABOLIC PANEL - Abnormal; Notable for the following:    Glucose, Bld 101 (*)    BUN 34 (*)    Albumin 3.2 (*)    Total Bilirubin 0.2 (*)    GFR calc non Af Amer 60 (*)    GFR calc Af Amer 69 (*)    All other components within normal limits  URINALYSIS, ROUTINE W REFLEX MICROSCOPIC  PRO B NATRIURETIC PEPTIDE  I-STAT CG4 LACTIC ACID, ED    Imaging Review No results found.   EKG Interpretation None      MDM   Final diagnoses:  None    Celeste Candelas is a 73 y.o. male here with leg swelling. Has hx of CHF so will get BNP and check kidney function. Has hx of thrombocytosis so at risk for DVT. Will repeat doppler. Also has cellulitis so  will likely give abx. If labs abnormal, will need admission.   1:51 PM WBC 14. Has 10% blasts as well. Given hx of pseudomonal cellulitis. Will give zosyn and admit. Duplex pending. Hospitalist request vanc as well.    Wandra Arthurs, MD 01/25/14 1351

## 2014-01-25 NOTE — H&P (Signed)
[facility] History and Physical  Brownie Nehme CVE:[id number] DOB: January 17, 1941 DOA: 01/25/2014  Referring physician:  PCP: [name], MD  Specialists:   Chief Complaint: Lower extremity swelling and redness  HPI: Juan [name] is a 73 y.o. male  With a history of chronic diastolic heart failure, hypertension, thrombocythemia, chronic venous stasis and lower extremity edema, presented to the emergency department for worsening of his lower extremity swelling. Patient noted that his lower extremities swelling and redness started approximately one week ago.  He also noticed that he had a rash on his back as well as his lower extremity however there was no blistering. The rash is described as sharp and itchy. Patient stated that the pain, redness, swelling in lower extremities began to worsen past weekend, however he did not go see his medical doctor. Patient denied any fever at home, chills, chest pain, shortness of breath. He denied any recent travel or ill contacts.  Review of Systems:  Constitutional: Denies fever, chills, diaphoresis, appetite change and fatigue.  HEENT: Denies photophobia, eye pain, redness, hearing loss, ear pain, congestion, sore throat, rhinorrhea, sneezing, mouth sores, trouble swallowing, neck pain, neck stiffness and tinnitus.   Respiratory: Denies SOB, DOE, cough, chest tightness,  and wheezing.   Cardiovascular: Denies chest pain, palpitations.  Complains of leg swelling.  Gastrointestinal: Denies nausea, vomiting, abdominal pain, diarrhea, constipation, blood in stool and abdominal distention.  Genitourinary: Denies dysuria, urgency, frequency, hematuria, flank pain and difficulty urinating.  Musculoskeletal: Denies myalgias, back pain, joint swelling, arthralgias and gait problem.  Skin: Complains of redness, burning pain with lower extremity. Neurological: Denies dizziness, seizures, syncope, weakness, light-headedness, numbness and headaches.  Hematological: Denies  adenopathy. Easy bruising, personal or family bleeding history  Psychiatric/Behavioral: Denies suicidal ideation, mood changes, confusion, nervousness, sleep disturbance and agitation  Past Medical History  Diagnosis Date  . CAD (coronary artery disease)     dx elsewheer in past, no documentation. Non-ischemic myovue 2007  . CHF (congestive heart failure)     dx elsewhere, no documentation. Normal EF by previous echo. Trival AS needs f/u ECHO by 2012  . Hypertension   . Sleep apnea, obstructive     at some point used CPAP, was d/c  years ago  . Anemia   . History of thrombocytosis   . Allergic rhinitis   . Edema     R>L leg, u/s 5-12 neg for DVT  . Hemorrhoid   . Type II diabetes mellitus   . Sinus congestion   . Migraine     "once/wk at least" (07/11/2013)   Past Surgical History  Procedure Laterality Date  . Toe surgery Right     "tried to straighten out big toe" (07/11/2013)   Social History:  reports that he quit smoking about 47 years ago. His smoking use included Cigarettes. He has a 3 pack-year smoking history. He has never used smokeless tobacco. He reports that he does not drink alcohol or use illicit drugs. The eye with his son. Uses a walker for ambulation.  No Known Allergies  Family History  Problem Relation Age of Onset  . Colon cancer Neg Hx   . Prostate cancer Neg Hx   . Heart attack Neg Hx   . Diabetes Neg Hx   . Schizophrenia Son     Prior to Admission medications   Medication Sig Start Date End Date Taking? Authorizing Provider  acetaminophen (TYLENOL) 500 MG tablet Take 500-1,000 mg by mouth every 6 (six) hours as needed for moderate  pain or headache.    Yes Historical Provider, MD  anagrelide (AGRYLIN) 0.5 MG capsule Take 0.5 mg by mouth daily.   Yes Historical Provider, MD  b complex vitamins capsule Take 1 capsule by mouth daily. 01/12/13  Yes Irene Pap, NP  bacitracin 500 UNIT/GM ointment Apply 1 application topically daily as needed for wound  care.   Yes Historical Provider, MD  Ferrous Sulfate Dried 200 (65 FE) MG TABS Take 1 tablet by mouth daily.    Yes Historical Provider, MD  fluticasone (FLONASE) 50 MCG/ACT nasal spray Place 2 sprays into both nostrils daily.    Yes Historical Provider, MD  folic acid (FOLVITE) 1 MG tablet Take 1 mg by mouth daily.    Yes Historical Provider, MD  furosemide (LASIX) 40 MG tablet Take 160 mg by mouth daily.   Yes Historical Provider, MD  HYDROcodone-acetaminophen (NORCO/VICODIN) 5-325 MG per tablet Take 1 tablet by mouth every 6 (six) hours as needed for moderate pain.   Yes Historical Provider, MD  lisinopril (PRINIVIL,ZESTRIL) 20 MG tablet Take 20 mg by mouth daily.   Yes Historical Provider, MD  potassium chloride SA (K-DUR,KLOR-CON) 20 MEQ tablet Take 20 mEq by mouth daily.   Yes Historical Provider, MD   Physical Exam: Filed Vitals:   01/25/14 1315  BP:   Pulse: 75  Temp:   Resp: 16     General: Well developed, well nourished, NAD, appears stated age  HEENT: NCAT, PERRLA, EOMI, Anicteic Sclera, mucous membranes moist.   Neck: Supple, no JVD, no masses  Cardiovascular: S1 S2 auscultated, no rubs, murmurs or gallops. Regular rate and rhythm.  Respiratory: Clear to auscultation bilaterally with equal chest rise  Abdomen: Soft, obese, nontender, nondistended, + bowel sounds  Extremities: warm dry without cyanosis clubbing.  3+ pitting edema in LE B/L  Neuro: AAOx3, cranial nerves grossly intact. Strength 5/5 in patient's upper and lower extremities bilaterally  Skin: Lower extremity erythema R>L, small wound/abrasion noted on medial aspect of RLE, chronic venous stasis changes  Psych: Normal affect and demeanor with intact judgement and insight  Labs on Admission:  Basic Metabolic Panel:  Recent Labs Lab 01/25/14 1115  NA 142  K 4.5  CL 104  CO2 26  GLUCOSE 101*  BUN 34*  CREATININE 1.18  CALCIUM 9.6   Liver Function Tests:  Recent Labs Lab 01/25/14 1115    AST 27  ALT 9  ALKPHOS 114  BILITOT 0.2*  PROT 6.4  ALBUMIN 3.2*   No results found for this basename: LIPASE, AMYLASE,  in the last 168 hours No results found for this basename: AMMONIA,  in the last 168 hours CBC:  Recent Labs Lab 01/25/14 1115  WBC 13.6*  NEUTROABS 6.7  HGB 10.0*  HCT 31.6*  MCV 81.0  PLT 311   Cardiac Enzymes: No results found for this basename: CKTOTAL, CKMB, CKMBINDEX, TROPONINI,  in the last 168 hours  BNP (last 3 results)  Recent Labs  07/11/13 0602 12/09/13 0751 01/25/14 1115  PROBNP 306.5* 5630.0* 828.2*   CBG: No results found for this basename: GLUCAP,  in the last 168 hours  Radiological Exams on Admission: No results found.  EKG: None  Assessment/Plan  Bilateral Lower Extremity cellulitis -Patient will be admitted to medical floor. -Will place patient on vancomycin and Zosyn -07/11/2013 patient had wound culture which showed Pseudomonas and Staphylococcus -Will also consult wound care -Low extremity Dopplers currently pending -Will provide patient pain control as needed.  Hypertension -Continue  lisinopril, Lasix  Chronic Diastolic heart failure -Echocardiogram in January 2015 showed a grade 1 diastolic dysfunction with an EF of 55-60% -Will place patient on fluid restriction, monitor his daily weights, intake and output -Currently compensated.  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 Admission, patients condition and plan of care  including tests being ordered have been discussed with the patient, who indicates understanding and agrees with the plan and Code Status.  Disposition Plan: Admitted  Time spent: 60 minutes  Gesselle Fitzsimons D.O. [facility] Pager [phone]  If 7PM-7AM, please contact night-coverage [email or web address] Password [email or web address] 01/25/2014, 2:10 PM

## 2014-01-25 NOTE — Progress Notes (Signed)
  Report received from [place], [place] for admission to [phone]

## 2014-01-26 ENCOUNTER — Inpatient Hospital Stay (HOSPITAL_COMMUNITY): Payer: Medicare Other

## 2014-01-26 DIAGNOSIS — I509 Heart failure, unspecified: Secondary | ICD-10-CM

## 2014-01-26 DIAGNOSIS — L03119 Cellulitis of unspecified part of limb: Secondary | ICD-10-CM | POA: Diagnosis not present

## 2014-01-26 DIAGNOSIS — D649 Anemia, unspecified: Secondary | ICD-10-CM

## 2014-01-26 DIAGNOSIS — L02419 Cutaneous abscess of limb, unspecified: Principal | ICD-10-CM

## 2014-01-26 DIAGNOSIS — I5032 Chronic diastolic (congestive) heart failure: Secondary | ICD-10-CM

## 2014-01-26 LAB — BASIC METABOLIC PANEL
Anion gap: 13 (ref 5–15)
BUN: 29 mg/dL — AB (ref 6–23)
CO2: 25 mEq/L (ref 19–32)
Calcium: 9.8 mg/dL (ref 8.4–10.5)
Chloride: 102 mEq/L (ref 96–112)
Creatinine, Ser: 1.43 mg/dL — ABNORMAL HIGH (ref 0.50–1.35)
GFR calc Af Amer: 55 mL/min — ABNORMAL LOW (ref >90)
GFR calc non Af Amer: 47 mL/min — ABNORMAL LOW (ref >90)
Glucose, Bld: 95 mg/dL (ref 70–99)
Potassium: 5 mEq/L (ref 3.7–5.3)
Sodium: 140 mEq/L (ref 137–147)

## 2014-01-26 LAB — CBC
HEMATOCRIT: 35.5 % — AB (ref 39.0–52.0)
Hemoglobin: 11.1 g/dL — ABNORMAL LOW (ref 13.0–17.0)
MCH: 25.3 pg — AB (ref 26.0–34.0)
MCHC: 31.3 g/dL (ref 30.0–36.0)
MCV: 80.9 fL (ref 78.0–100.0)
Platelets: 340 10*3/uL (ref 150–400)
RBC: 4.39 MIL/uL (ref 4.22–5.81)
RDW: 19.2 % — ABNORMAL HIGH (ref 11.5–15.5)
WBC: 14.6 10*3/uL — ABNORMAL HIGH (ref 4.0–10.5)

## 2014-01-26 LAB — PATHOLOGIST SMEAR REVIEW

## 2014-01-26 MED ORDER — HYDROCERIN EX CREA
TOPICAL_CREAM | Freq: Every day | CUTANEOUS | Status: DC
  Administered 2014-01-26 – 2014-01-27 (×2): via TOPICAL
  Filled 2014-01-26 (×2): qty 113

## 2014-01-26 MED ORDER — BACITRACIN ZINC 500 UNIT/GM EX OINT
TOPICAL_OINTMENT | CUTANEOUS | Status: DC | PRN
  Administered 2014-01-26 (×2): via TOPICAL
  Filled 2014-01-26: qty 28.35

## 2014-01-26 NOTE — Consult Note (Addendum)
WOC wound consult note Reason for Consult: Consult requested for bilat legs.  Pt has generalized edema and pain to right leg, but no open wounds or drainage requiring topical treatment at this time. Bilat legs with dry scaly skin. Earlier studies were negative for DVT.   Dressing procedure/placement/frequency: Eucerin cream to assist with removal of nonviable skin Q day after bathing. Please re-consult if further assistance is needed.  Thank-you,  Julien Girt MSN, [place], [place], [place], [place]

## 2014-01-26 NOTE — Progress Notes (Signed)
Occupational Therapy Evaluation Patient Details Name: Juan [name] MRN: [id number] DOB: 10/27/1940 Today's Date: 01/26/2014    History of Present Illness Patient is a 73 y/o male admitted due to worsening LE swelling and rash on his back as well as his lower extremities. PMH of chronic diastolic heart failure, hypertension, thrombocythemia, chronic venous stasis and lower extremity edema. Pt started on vancomycin and Zosyn due to BLE cellulitis.   Clinical Impression   Patient unwilling to get OOB/EOB so bed-level eval performed. Patient perseverating on wound care and getting someone to wash his legs and feet.     Follow Up Recommendations  Home health OT;Supervision/Assistance - 24 hour    Equipment Recommendations  Other (comment) (tbd)    Recommendations for Other Services PT consult     Precautions / Restrictions Precautions Precautions: Fall Restrictions Weight Bearing Restrictions: No      Mobility Bed Mobility Overal bed mobility: Modified Independent             General bed mobility comments: HOB elevated, use of rails.  Transfers Overall transfer level: Needs assistance Equipment used: Straight cane Transfers: Sit to/from Stand Sit to Stand: Min assist         General transfer comment: Min A to rise from EOB with multiple attempts without assist and pt unable to stand with use of body momentum. Use of SPC for support.    Balance Overall balance assessment: Needs assistance   Sitting balance-Leahy Scale: Good     Standing balance support: During functional activity Standing balance-Leahy Scale: Poor Standing balance comment: Requires UE support during static and dynamic standing due to balance deficits. Able to ambulate to bathroom and urinate in standing with [facility] for support.                             ADL Overall ADL's : Needs assistance/impaired Eating/Feeding: Independent   Grooming: Set up   Upper Body Bathing: Set up   Lower  Body Bathing: Minimal assistance   Upper Body Dressing : Set up   Lower Body Dressing: Minimal assistance               Functional mobility during ADLs:  (pt refused EOB/OOB during eval) General ADL Comments: Patient perseverating on LE cellulitis and need for wound care. RN made aware. Bed level eval as pt refused OOB/EOB, stating, "I already did that today." I urinal use.     Vision                     Perception     Praxis      Pertinent Vitals/Pain Pain Assessment: No/denies pain     Hand Dominance Right   Extremity/Trunk Assessment Upper Extremity Assessment Upper Extremity Assessment: Overall WFL for tasks assessed   Lower Extremity Assessment Lower Extremity Assessment: Defer to PT evaluation       Communication Communication Communication: No difficulties   Cognition Arousal/Alertness: Awake/alert Behavior During Therapy: WFL for tasks assessed/performed Overall Cognitive Status: No family/caregiver present to determine baseline cognitive functioning       Memory: Decreased short-term memory             General Comments       Exercises       Shoulder Instructions      Home Living Family/patient expects to be discharged to:: Private residence Living Arrangements: Children Available Help at Discharge: Family;Available 24 hours/day Type of Home: [place]  Access: Level entry     Home Layout: One level     Bathroom Shower/Tub: Other (comment) (sponge bathes)   Bathroom Toilet: Standard     Home Equipment: Cane - single point          Prior Functioning/Environment Level of Independence: Independent with assistive device(s)        Comments: Reports doing bathing and dressing "the best that I can do." Does sponge baths at baseline. Walks to [employer] daily for exercise. Reports 1 fall recently.    OT Diagnosis: Generalized weakness   OT Problem List: Decreased strength;Decreased activity tolerance;Decreased  safety awareness;Increased edema   OT Treatment/Interventions: Self-care/ADL training;Therapeutic exercise;DME and/or AE instruction;Therapeutic activities;Patient/family education    OT Goals(Current goals can be found in the care plan section) Acute Rehab OT Goals Patient Stated Goal: to get someone to come and wash my feet. OT Goal Formulation: With patient Time For Goal Achievement: 02/09/14 Potential to Achieve Goals: Good  OT Frequency: Min 2X/week   Barriers to D/C:            Co-evaluation              End of Session Nurse Communication: Other (comment) (pt has questions regarding wound care)  Activity Tolerance: Patient tolerated treatment well Patient left: in bed;with call [place]/phone within reach   Time: 1455-1515 OT Time Calculation (min): 20 min Charges:  OT General Charges $OT Visit: 1 Procedure OT Evaluation $Initial OT Evaluation Tier I: 1 Procedure OT Treatments $Self Care/Home Management : 8-22 mins G-Codes:    Yarelin Reichardt A 02-17-2014, 3:49 PM

## 2014-01-26 NOTE — Care Management Note (Signed)
    Page 1 of 2   01/27/2014     1:48:52 PM CARE MANAGEMENT NOTE 01/27/2014  Patient:  Juan [name], Juan [name]   Account Number:  [id number]  Date Initiated:  01/26/2014  Documentation initiated by:  Tomi Bamberger  Subjective/Objective Assessment:   dx bil cellulitis  admit- from home     Action/Plan:   pt/ot eval- rec hhpt/ot   Anticipated DC Date:  01/27/2014   Anticipated DC Plan:  [place]  CM consult      [facility] Choice  HOME HEALTH   Choice offered to / List presented to:  C-1 Patient        [place] arranged  [place].   Status of service:  Completed, signed off Medicare Important Message given?   (If response is "NO", the following Medicare IM given date fields will be blank) Date Medicare IM given:   Medicare IM given by:   Date Additional Medicare IM given:   Additional Medicare IM given by:    Discharge Disposition:  [place]  Per UR Regulation:  Reviewed for med. necessity/level of care/duration of stay  If discussed at [place] of Stay Meetings, dates discussed:    Comments:  01/27/14 11:45 CM met with pt and grandson (wouldn't give name) in room to offer choice of home health agency.  Pt confused and grandson said they had no preference and [facility] would be fine.  Pt states he has walker and cane at home. Address and contact information verified with grandson. Referral called to [facility] rep, Kristen.  No other CM needs were communicated. Mariane Masters, BSN, [place] [phone].  01/26/14 [place], BSN [phone] Per physical therapy rec hhpt/ot, hand off given to weekend NCM.

## 2014-01-26 NOTE — Evaluation (Signed)
Physical Therapy Evaluation Patient Details Name: Pieter Fooks MRN: [id number] DOB: February 19, 1941 Today's Date: 01/26/2014   History of Present Illness  Patient is a 73 y/o male admitted due to worsening LE swelling and rash on his back as well as his lower extremities. PMH of chronic diastolic heart failure, hypertension, thrombocythemia, chronic venous stasis and lower extremity edema. Pt started on vancomycin and Zosyn due to BLE cellulitis.   Clinical Impression  Patient presents with balance deficits and impaired safety awareness limiting safe mobility. Not able to accurately assess endurance as pt self limited ambulation distance due to worried about his belongings in his room. No overt LOB with gait training however very unsteady. May need to assess mobility with use of RW for more support. Pt would benefit from acute PT to improve transfers, gait and balance so pt can maximize independence and return to PLOF.     Follow Up Recommendations Home health PT;Supervision for mobility/OOB (but pt refusing HHPT.)    Equipment Recommendations       Recommendations for Other Services OT consult     Precautions / Restrictions Precautions Precautions: Fall Restrictions Weight Bearing Restrictions: No      Mobility  Bed Mobility Overal bed mobility: Modified Independent             General bed mobility comments: HOB elevated, use of rails.  Transfers Overall transfer level: Needs assistance Equipment used: Straight cane Transfers: Sit to/from Stand Sit to Stand: Min assist         General transfer comment: Min A to rise from EOB with multiple attempts without assist and pt unable to stand with use of body momentum. Use of SPC for support.  Ambulation/Gait Ambulation/Gait assistance: Min guard Ambulation Distance (Feet): 50 Feet Assistive device: Straight cane Gait Pattern/deviations: Step-through pattern;Narrow base of support;Decreased stride length;Scissoring;Drifts  right/left Gait velocity: decreased   General Gait Details: Unsteady gait noted with use of SPC. Scrissoring x2 during gait training with distractions, able to regain balance without assist. Reports using SPC in right hand during eval but used it in left hand during gait training. Pt self limited distance, "don't want to go too far i have personal stuff in my room."  Stairs            Wheelchair Mobility    Modified Rankin (Stroke Patients Only)       Balance Overall balance assessment: Needs assistance   Sitting balance-Leahy Scale: Good     Standing balance support: During functional activity Standing balance-Leahy Scale: Poor Standing balance comment: Requires UE support during static and dynamic standing due to balance deficits. Able to ambulate to bathroom and urinate in standing with [facility] for support.                              Pertinent Vitals/Pain Pain Assessment: No/denies pain    Home Living Family/patient expects to be discharged to:: Private residence Living Arrangements: Children Available Help at Discharge: Family;Available 24 hours/day Type of Home: Apartment Home Access: Level entry     Home Layout: One level Home Equipment: Cane - single point      Prior Function Level of Independence: Independent with assistive device(s)         Comments: Reports doing bathing and dressing "the best that I can do." Does sponge baths at baseline. Walks to [employer] daily for exercise. Reports 1 fall recently.     Hand Dominance  Extremity/Trunk Assessment   Upper Extremity Assessment: Overall WFL for tasks assessed           Lower Extremity Assessment: Overall WFL for tasks assessed         Communication   Communication: No difficulties  Cognition Arousal/Alertness: Awake/alert Behavior During Therapy: WFL for tasks assessed/performed Overall Cognitive Status: No family/caregiver present to determine baseline cognitive  functioning (Pt perseverating on having someone come from home health to wash his feet while in the hospital after being told by numerous staff this is for discharge.)       Memory: Decreased short-term memory              General Comments General comments (skin integrity, edema, etc.): Dry, scaly skin along BLEs distal to knee. No open sores or blisters. Discoloration of skin BLEs.     Exercises        Assessment/Plan    PT Assessment Patient needs continued PT services  PT Diagnosis Difficulty walking   PT Problem List Decreased activity tolerance;Decreased knowledge of use of DME;Decreased safety awareness;Decreased balance;Decreased mobility;Decreased knowledge of precautions;Decreased skin integrity  PT Treatment Interventions Balance training;Gait training;DME instruction;Patient/family education;Functional mobility training;Therapeutic activities;Therapeutic exercise   PT Goals (Current goals can be found in the Care Plan section) Acute Rehab PT Goals Patient Stated Goal: to get someone to come and wash my feet. PT Goal Formulation: With patient Time For Goal Achievement: 02/09/14 Potential to Achieve Goals: Good    Frequency Min 3X/week   Barriers to discharge        Co-evaluation               End of Session Equipment Utilized During Treatment: Gait belt Activity Tolerance: Patient limited by fatigue Patient left: in bed;with call [place]/phone within reach;with bed alarm set;with nursing/sitter in room Nurse Communication: Mobility status         Time: 5397-6734 PT Time Calculation (min): 25 min   Charges:   PT Evaluation $Initial PT Evaluation Tier I: 1 Procedure PT Treatments $Gait Training: 8-22 mins   PT G CodesCandy Sledge A 01/26/2014, 12:24 PM Candy Sledge, [place], DPT [phone]

## 2014-01-26 NOTE — Progress Notes (Signed)
PATIENT DETAILS Name: Juan [name] Age: 73 y.o. Sex: male Date of Birth: [birth year] Admit Date: 01/25/2014 Admitting Physician Cristal Ford, DO EXB:MWUX Paz, MD  Subjective: No major issues-complains of mild pain in the right ankle area.  Assessment/Plan: Active Problems: ? Cellulitis of right lower extremity - Not sure this patient actually has cellulitis-his main complaint is pain in his right ankle area. Patient has chronic lower extremity edema, and stasis dermatitis and a lot of chronic skin changes. I do not see any areas of acute cellulitis/erythema today-but did receive IV vancomycin since yesterday. I would discontinue Zosyn, continue vancomycin. Lower extremity Dopplers negative for DVT. 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  History of CAD - Reviewed outpatient cardiology notes-had a normal Myoview in 2007. - Stable without any chest pain or shortness of breath  Chronic lower extremity edema, stasis dermatitis - Chronic issue-as noted above-current exam consistent with chronic stasis dermatitis changes.  Disposition: Remain inpatient- home 9/12  DVT Prophylaxis: Prophylactic Lovenox   Code Status: Full code   Family Communication None at bedside  Procedures:  None  CONSULTS:  None  Time spent 40 minutes-which includes 50% of the time with face-to-face with patient/ family and coordinating care related to the above assessment  and plan.    MEDICATIONS: Scheduled Meds: . anagrelide  0.5 mg Oral Daily  . enoxaparin (LOVENOX) injection  60 mg Subcutaneous Q24H  . ferrous sulfate  325 mg Oral Q breakfast  . fluticasone  2 spray Each Nare Daily  . folic acid  1 mg Oral Daily  . furosemide  160 mg Oral Daily  . hydrocerin   Topical Daily  . lisinopril  20 mg Oral Daily  . piperacillin-tazobactam  3.375 g Intravenous Q8H  . potassium chloride SA  20 mEq Oral Daily  . sodium chloride  3 mL Intravenous Q12H  . vancomycin  1,000 mg Intravenous Q12H   Continuous Infusions:  PRN Meds:.sodium chloride, acetaminophen, acetaminophen, bacitracin, diphenhydrAMINE, HYDROcodone-acetaminophen, morphine injection, sodium chloride  Antibiotics: Anti-infectives   Start     Dose/Rate Route Frequency Ordered Stop   01/26/14 0500  vancomycin (VANCOCIN) IVPB 1000 mg/200 mL premix     1,000 mg 200 mL/hr over 60 Minutes Intravenous Every 12 hours 01/25/14 1612     01/25/14 2200  piperacillin-tazobactam (ZOSYN) IVPB 3.375 g     3.375 g 12.5 mL/hr over 240 Minutes Intravenous Every 8 hours 01/25/14 1612     01/25/14 1615  vancomycin (VANCOCIN) 2,000 mg in sodium chloride 0.9 % 500 mL IVPB     2,000 mg 250 mL/hr over 120 Minutes Intravenous  Once 01/25/14 1611 01/25/14 1900   01/25/14 1400  vancomycin (VANCOCIN) IVPB 1000 mg/200 mL premix  Status:  Discontinued     1,000 mg 200 mL/hr over 60 Minutes Intravenous  Once 01/25/14 1351 01/25/14 1554   01/25/14 1345  piperacillin-tazobactam (ZOSYN) IVPB 3.375 g     3.375 g 100 mL/hr over 30 Minutes Intravenous  Once 01/25/14 1337 01/25/14 1438  PHYSICAL EXAM: Vital signs in last 24 hours: Filed Vitals:   01/25/14 1707 01/25/14 2112 01/26/14 0456 01/26/14 0613  BP: 132/59 116/56  144/65  Pulse: 80 81  79  Temp:  97.5 F (36.4 C)  98.6 F (37 C)  TempSrc:  Oral  Oral  Resp:    20  Height:      Weight:   115.2 kg (253 lb 15.5 oz)   SpO2:  96%  96%    Weight change:   Filed Weights   01/25/14 1553 01/26/14 0456  Weight: 116.6 kg (257 lb 0.9 oz) 115.2 kg (253 lb 15.5 oz)   Body mass index is 31.58 kg/(m^2).   Gen Exam: Awake and alert with clear speech.   Neck: Supple, No JVD.   Chest: B/L Clear.   CVS: S1 S2 Regular, no murmurs.  Abdomen: soft, BS +, non tender, non distended.  Extremities: no edema, lower extremities warm to touch- however chronic venous stasis changes noted in bilateral legs-with no open wounds or drainage. Neurologic: Non Focal.   Skin: No Rash.   Wounds: N/A.    Intake/Output from previous day:  Intake/Output Summary (Last 24 hours) at 01/26/14 1254 Last data filed at 01/26/14 0800  Gross per 24 hour  Intake   1367 ml  Output   2950 ml  Net  -1583 ml     LAB RESULTS: CBC  Recent Labs Lab 01/25/14 1115 01/26/14 0800  WBC 13.6* 14.6*  HGB 10.0* 11.1*  HCT 31.6* 35.5*  PLT 311 340  MCV 81.0 80.9  MCH 25.6* 25.3*  MCHC 31.6 31.3  RDW 19.0* 19.2*  LYMPHSABS 3.1  --   MONOABS 1.1*  --   EOSABS 0.5  --   BASOSABS 0.8*  --     Chemistries   Recent Labs Lab 01/25/14 1115 01/26/14 0800  NA 142 140  K 4.5 5.0  CL 104 102  CO2 26 25  GLUCOSE 101* 95  BUN 34* 29*  CREATININE 1.18 1.43*  CALCIUM 9.6 9.8    CBG: No results found for this basename: GLUCAP,  in the last 168 hours  GFR Estimated Creatinine Clearance: 64.1 ml/min (by C-G formula based on Cr of 1.43).  Coagulation profile No results found for this basename: INR, PROTIME,  in the last 168 hours  Cardiac Enzymes No results found for this basename: CK, CKMB, TROPONINI, MYOGLOBIN,  in the last 168 hours  No components found with this basename: POCBNP,  No results found for this basename: DDIMER,  in the last 72 hours No results found for this basename: HGBA1C,  in the last 72 hours No results found for this basename: CHOL, HDL, LDLCALC, TRIG, CHOLHDL, LDLDIRECT,  in the last 72 hours No results found for this basename: TSH, T4TOTAL,  FREET3, T3FREE, THYROIDAB,  in the last 72 hours No results found for this basename: VITAMINB12, FOLATE, FERRITIN, TIBC, IRON, RETICCTPCT,  in the last 72 hours No results found for this basename: LIPASE, AMYLASE,  in the last 72 hours  Urine Studies No results found for this basename: UACOL, UAPR, USPG, UPH, UTP, UGL, UKET, UBIL, UHGB, UNIT, UROB, ULEU, UEPI, UWBC, URBC, UBAC, CAST, CRYS, UCOM, BILUA,  in the last 72 hours  MICROBIOLOGY: No results found for this or any previous visit (from the past 240 hour(s)).  RADIOLOGY STUDIES/RESULTS: Dg Chest 2 View  01/26/2014   CLINICAL DATA:  Cough  EXAM: CHEST  2 VIEW  COMPARISON:  December 09, 2013  FINDINGS:  There is patchy bibasilar lung scarring. There is no frank edema or consolidation. The heart size and pulmonary vascularity are normal. No adenopathy. No bone lesions.  IMPRESSION: Patchy bibasilar lung scarring.  No edema or consolidation.   Electronically Signed   By: Lowella Grip M.D.   On: 01/26/2014 10:49    Oren Binet, MD  [facility] Pager:[phone]  If 7PM-7AM, please contact night-coverage [email or web address] Password [email or web address] 01/26/2014, 12:54 PM   LOS: 1 day   **Disclaimer: This note may have been dictated with voice recognition software. Similar sounding words can inadvertently be transcribed and this note may contain transcription errors which may not have been corrected upon publication of note.**

## 2014-01-27 DIAGNOSIS — I2510 Atherosclerotic heart disease of native coronary artery without angina pectoris: Secondary | ICD-10-CM

## 2014-01-27 MED ORDER — HYDROCODONE-ACETAMINOPHEN 5-325 MG PO TABS: 1.0000 | ORAL_TABLET | Freq: Four times a day (QID) | ORAL | Status: DC | PRN

## 2014-01-27 MED ORDER — HYDROCERIN EX CREA: TOPICAL_CREAM | CUTANEOUS | Status: DC

## 2014-01-27 MED ORDER — DOXYCYCLINE HYCLATE 50 MG PO CAPS: 100.0000 mg | ORAL_CAPSULE | Freq: Two times a day (BID) | ORAL | Status: DC

## 2014-01-27 MED ORDER — BACITRACIN 500 UNIT/GM EX OINT: 1.0000 "application " | TOPICAL_OINTMENT | Freq: Every day | CUTANEOUS | Status: DC | PRN

## 2014-01-27 NOTE — Discharge Instructions (Signed)
Follow with Primary MD  [name], MD  In 1 week  Please get a complete blood count and chemistry panel checked by your Primary MD at your next visit, and again as instructed by your Primary MD.  Get Medicines reviewed and adjusted. Please take all your medications with you for your next visit with your Primary MD  Please request your Primary MD to go over all hospital tests and procedure/radiological results at the follow up, please ask your Primary MD to get all Hospital records sent to his/her office.  If you experience worsening of your admission symptoms, develop shortness of breath, life threatening emergency, suicidal or homicidal thoughts you must seek medical attention immediately by calling 911 or calling your MD immediately  if symptoms less severe.  You must read complete instructions/literature along with all the possible adverse reactions/side effects for all the Medicines you take and that have been prescribed to you. Take any new Medicines after you have completely understood and accpet all the possible adverse reactions/side effects.   Do not drive when taking Pain medications.   Do not take more than prescribed Pain, Sleep and Anxiety Medications  Special Instructions: If you have smoked or chewed Tobacco  in the last 2 yrs please stop smoking, stop any regular Alcohol  and or any Recreational drug use.  Wear Seat belts while driving.  Please note  You were cared for by a hospitalist during your hospital stay. Once you are discharged, your primary care physician will handle any further medical issues. Please note that NO REFILLS for any discharge medications will be authorized once you are discharged, as it is imperative that you return to your primary care physician (or establish a relationship with a primary care physician if you do not have one) for your aftercare needs so that they can reassess your need for medications and monitor your lab values.

## 2014-01-27 NOTE — Discharge Summary (Addendum)
PATIENT DETAILS Name: Juan [name] Age: 73 y.o. Sex: male Date of Birth: [birth year] MRN: [id number]. Admitting Physician: Cristal Ford, DO WUJ:WJXB Larose Kells, MD  Admit Date: 01/25/2014 Discharge date: 01/27/2014  Recommendations for Outpatient Follow-up:  1. Gen. health maintenance 2. Please reassess symptoms, labs at next visit  PRIMARY DISCHARGE DIAGNOSIS:  Active Problems:   ANEMIA-NOS   THROMBOCYTOSIS   HYPERTENSION   CORONARY ARTERY DISEASE   Chronic diastolic CHF (congestive heart failure)   Chronic lower extremity edema, stasis dermatitis   Cellulitis   Leukocytosis, unspecified      PAST MEDICAL HISTORY: Past Medical History  Diagnosis Date  . CAD (coronary artery disease)     dx elsewheer in past, no documentation. Non-ischemic myovue 2007  . CHF (congestive heart failure)     dx elsewhere, no documentation. Normal EF by previous echo. Trival AS needs f/u ECHO by 2012  . Hypertension   . Sleep apnea, obstructive     at some point used CPAP, was d/c  years ago  . Anemia   . History of thrombocytosis   . Allergic rhinitis   . Edema     R>L leg, u/s 5-12 neg for DVT  . Hemorrhoid   . Type II diabetes mellitus   . Sinus congestion   . Migraine     "once/wk at least" (07/11/2013)  . Shortness of breath     DISCHARGE MEDICATIONS:   Medication List         acetaminophen 500 MG tablet  Commonly known as:  TYLENOL  Take 500-1,000 mg by mouth every 6 (six) hours as needed for moderate pain or headache.     anagrelide 0.5 MG capsule  Commonly known as:  AGRYLIN  Take 0.5 mg by mouth daily.     b complex vitamins capsule  Take 1 capsule by mouth daily.     bacitracin 500 UNIT/GM ointment  Apply 1 application topically daily as needed for wound care.     doxycycline 50 MG capsule  Commonly known as:  VIBRAMYCIN  Take 2 capsules (100 mg total) by mouth 2 (two) times daily.     Ferrous Sulfate Dried 200 (65 FE) MG Tabs  Take 1 tablet by mouth daily.       fluticasone 50 MCG/ACT nasal spray  Commonly known as:  FLONASE  Place 2 sprays into both nostrils daily.     folic acid 1 MG tablet  Commonly known as:  FOLVITE  Take 1 mg by mouth daily.     furosemide 40 MG tablet  Commonly known as:  LASIX  Take 160 mg by mouth daily.     hydrocerin Crea  Apply Eucerin cream to BLE Q day after bathing and roughly towel drying to remove loose skin     HYDROcodone-acetaminophen 5-325 MG per tablet  Commonly known as:  NORCO/VICODIN  Take 1 tablet by mouth every 6 (six) hours as needed for moderate pain.     lisinopril 20 MG tablet  Commonly known as:  PRINIVIL,ZESTRIL  Take 20 mg by mouth daily.     potassium chloride SA 20 MEQ tablet  Commonly known as:  K-DUR,KLOR-CON  Take 20 mEq by mouth daily.        ALLERGIES:  No Known Allergies  BRIEF HPI:  See H&P, Labs, Consult and Test reports for all details in brief, patient was admitted for evaluation of mild cellulitis of his right lower extremity.  CONSULTATIONS:   None  PERTINENT RADIOLOGIC STUDIES: Dg  Chest 2 View  01/26/2014   CLINICAL DATA:  Cough  EXAM: CHEST  2 VIEW  COMPARISON:  December 09, 2013  FINDINGS: There is patchy bibasilar lung scarring. There is no frank edema or consolidation. The heart size and pulmonary vascularity are normal. No adenopathy. No bone lesions.  IMPRESSION: Patchy bibasilar lung scarring.  No edema or consolidation.   Electronically Signed   By: Lowella Grip M.D.   On: 01/26/2014 10:49     PERTINENT LAB RESULTS: CBC:  Recent Labs  01/25/14 1115 01/26/14 0800  WBC 13.6* 14.6*  HGB 10.0* 11.1*  HCT 31.6* 35.5*  PLT 311 340   CMET CMP     Component Value Date/Time   NA 140 01/26/2014 0800   NA 138 11/16/2013 0847   K 5.0 01/26/2014 0800   K 4.5 11/16/2013 0847   CL 102 01/26/2014 0800   CO2 25 01/26/2014 0800   CO2 22 11/16/2013 0847   GLUCOSE 95 01/26/2014 0800   GLUCOSE 126 11/16/2013 0847   BUN 29* 01/26/2014 0800   BUN 20.2 11/16/2013  0847   CREATININE 1.43* 01/26/2014 0800   CREATININE 1.2 11/16/2013 0847   CALCIUM 9.8 01/26/2014 0800   CALCIUM 9.8 11/16/2013 0847   PROT 6.4 01/25/2014 1115   PROT 6.7 11/16/2013 0847   ALBUMIN 3.2* 01/25/2014 1115   ALBUMIN 3.4* 11/16/2013 0847   AST 27 01/25/2014 1115   AST 25 11/16/2013 0847   ALT 9 01/25/2014 1115   ALT 11 11/16/2013 0847   ALKPHOS 114 01/25/2014 1115   ALKPHOS 125 11/16/2013 0847   BILITOT 0.2* 01/25/2014 1115   BILITOT 0.35 11/16/2013 0847   GFRNONAA 47* 01/26/2014 0800   GFRAA 55* 01/26/2014 0800    GFR Estimated Creatinine Clearance: 63 ml/min (by C-G formula based on Cr of 1.43). No results found for this basename: LIPASE, AMYLASE,  in the last 72 hours No results found for this basename: CKTOTAL, CKMB, CKMBINDEX, TROPONINI,  in the last 72 hours No components found with this basename: POCBNP,  No results found for this basename: DDIMER,  in the last 72 hours No results found for this basename: HGBA1C,  in the last 72 hours No results found for this basename: CHOL, HDL, LDLCALC, TRIG, CHOLHDL, LDLDIRECT,  in the last 72 hours No results found for this basename: TSH, T4TOTAL, FREET3, T3FREE, THYROIDAB,  in the last 72 hours No results found for this basename: VITAMINB12, FOLATE, FERRITIN, TIBC, IRON, RETICCTPCT,  in the last 72 hours Coags: No results found for this basename: PT, INR,  in the last 72 hours Microbiology: No results found for this or any previous visit (from the past 240 hour(s)).   BRIEF HOSPITAL COURSE:   Active Problems: ? Cellulitis of right lower extremity  - Not sure this patient actually has cellulitis-his main complaint was pain in his right ankle area. Patient has chronic lower extremity edema, and stasis dermatitis and a lot of chronic skin changes. On evaluation by the admitting physician, patient was felt to have cellulitis of both bilateral lower extremities and started on IV vancomycin and Zosyn. However on my evaluation the very next day, no  obvious areas of cellulitis was seen, however patient was already on vancomycin/Zosyn and may have already improved. By day of discharge, significantly improved, with only evidence of chronic skin changes, in any event, patient will be discharged on oral doxycycline. Please note ILower extremity Dopplers negative for DVT.   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  Anemia  - Secondary to chronic disease  - Hemoglobin stable, continue to monitor periodically   Chronic kidney disease stage III  - Monitor electrolytes periodically, creatinine close to baseline.   History of CAD  - Reviewed outpatient cardiology notes-had a normal Myoview in 2007.  - Stable without any chest pain or shortness of breath   Chronic lower extremity edema, stasis dermatitis  - Chronic issue-as noted above-current exam consistent with chronic stasis dermatitis changes.  - Seen by wound care this admission-current recommendations are Eucerin cream to assist with removal of nonviable skin Q day after bathing  TODAY-DAY OF DISCHARGE:  Subjective:   [name] today has no headache,no chest abdominal pain,no new weakness tingling or numbness, feels much better wants to go home today.   Objective:   Blood pressure 128/78, pulse 87, temperature 98.4 F (36.9 C), temperature source Oral, resp. rate 20, height 6' 3.2" (1.91 m), weight 111.041 kg (244 lb 12.8 oz), SpO2 94.00%.  Intake/Output Summary (Last 24 hours) at 01/27/14 1123 Last data filed at 01/27/14 1001  Gross per 24 hour  Intake   1680 ml  Output   3100 ml  Net  -1420 ml   Filed Weights   01/25/14 1553  01/26/14 0456 01/27/14 0508  Weight: 116.6 kg (257 lb 0.9 oz) 115.2 kg (253 lb 15.5 oz) 111.041 kg (244 lb 12.8 oz)    Exam Awake Alert, Oriented *3, No new F.N deficits, Normal affect [place].AT,PERRAL Supple Neck,No JVD, No cervical lymphadenopathy appriciated.  Symmetrical Chest wall movement, Good air movement bilaterally, CTAB RRR,No Gallops,Rubs or new Murmurs, No Parasternal Heave +ve B.Sounds, Abd Soft, Non tender, No organomegaly appriciated, No rebound -guarding or rigidity. No Cyanosis, Clubbing or edema, No new Rash or bruise- has extensive chronic dermatitis stasis changes in bilateral lower extremities with no erythema or drainage open wounds.1  DISCHARGE CONDITION: Stable  DISPOSITION: Home with home health services  DISCHARGE INSTRUCTIONS:    Activity:  As tolerated with Full fall precautions use walker/cane & assistance as needed  Follow with Primary MD  [name], MD  In 1 week  Please get a complete blood count and chemistry panel checked by your Primary MD at your next visit, and again as instructed by your Primary MD.  Get Medicines reviewed and adjusted. Please take all your medications with you for your next visit with your Primary MD  Please request your Primary MD to go over all hospital tests and procedure/radiological results at the follow up, please ask your Primary MD to get all Hospital records sent to his/her office.  If you experience worsening of your admission symptoms, develop shortness of breath, life threatening emergency, suicidal or homicidal thoughts you must seek medical attention immediately by calling 911 or calling your MD immediately  if symptoms less severe.  You must read complete instructions/literature along with all the possible adverse reactions/side effects for all the Medicines you take and that have been prescribed to you. Take any new Medicines after you have completely understood and accpet all the possible adverse reactions/side  effects.   Do not drive when taking Pain medications.   Do not take more than prescribed Pain, Sleep  and Anxiety Medications  Special Instructions: If you have smoked or chewed Tobacco  in the last 2 yrs please stop smoking, stop any regular Alcohol  and or any Recreational drug use.  Wear Seat belts while driving.  Please note  You were cared for by a hospitalist during your hospital stay. Once you are discharged, your primary care physician will handle any further medical issues. Please note that NO REFILLS for any discharge medications will be authorized once you are discharged, as it is imperative that you return to your primary care physician (or establish a relationship with a primary care physician if you do not have one) for your aftercare needs so that they can reassess your need for medications and monitor your lab values.  Diet recommendation: Heart Healthy diet      Discharge Instructions   Call MD for:  redness, tenderness, or signs of infection (pain, swelling, redness, odor or green/yellow discharge around incision site)    Complete by:  As directed      Call MD for:  severe uncontrolled pain    Complete by:  As directed      Call MD for:  temperature >100.4    Complete by:  As directed      Diet - low sodium heart healthy    Complete by:  As directed      Discharge wound care:    Complete by:  As directed   Eucerin cream to assist with removal of nonviable skin Q day after bathing.     Increase activity slowly    Complete by:  As directed            Follow-up Information   Follow up with [name], MD. Schedule an appointment as soon as possible for a visit in 1 week.   Specialty:  Internal Medicine   Contact information:   [place] [address] [phone]      Total Time spent on discharge equals 45 minutes.  SignedOren Binet 01/27/2014 11:23 AM  **Disclaimer: This note may have been dictated with voice  recognition software. Similar sounding words can inadvertently be transcribed and this note may contain transcription errors which may not have been corrected upon publication of note.**

## 2014-01-27 NOTE — Progress Notes (Signed)
Juan [name] to be D/C'd Home per MD order.  Discussed with the patient and all questions fully answered.    Medication List         acetaminophen 500 MG tablet  Commonly known as:  TYLENOL  Take 500-1,000 mg by mouth every 6 (six) hours as needed for moderate pain or headache.     anagrelide 0.5 MG capsule  Commonly known as:  AGRYLIN  Take 0.5 mg by mouth daily.     b complex vitamins capsule  Take 1 capsule by mouth daily.     bacitracin 500 UNIT/GM ointment  Apply 1 application topically daily as needed for wound care.     doxycycline 50 MG capsule  Commonly known as:  VIBRAMYCIN  Take 2 capsules (100 mg total) by mouth 2 (two) times daily.     Ferrous Sulfate Dried 200 (65 FE) MG Tabs  Take 1 tablet by mouth daily.     fluticasone 50 MCG/ACT nasal spray  Commonly known as:  FLONASE  Place 2 sprays into both nostrils daily.     folic acid 1 MG tablet  Commonly known as:  FOLVITE  Take 1 mg by mouth daily.     furosemide 40 MG tablet  Commonly known as:  LASIX  Take 160 mg by mouth daily.     hydrocerin Crea  Apply Eucerin cream to BLE Q day after bathing and roughly towel drying to remove loose skin     HYDROcodone-acetaminophen 5-325 MG per tablet  Commonly known as:  NORCO/VICODIN  Take 1 tablet by mouth every 6 (six) hours as needed for moderate pain.     lisinopril 20 MG tablet  Commonly known as:  PRINIVIL,ZESTRIL  Take 20 mg by mouth daily.     potassium chloride SA 20 MEQ tablet  Commonly known as:  K-DUR,KLOR-CON  Take 20 mEq by mouth daily.        VVS, IV catheter discontinued intact. Site without signs and symptoms of complications. Dressing and pressure applied.  An After Visit Summary was printed and given to the patient.  D/c education completed with patient/family including follow up instructions, medication list, d/c activities limitations if indicated, with other d/c instructions as indicated by MD - patient able to verbalize  understanding, all questions fully answered.   Patient instructed to return to ED, call 911, or call MD for any changes in condition.   Patient escorted via [place], and D/C home via private auto.  [name] 01/27/2014 11:28 AM

## 2014-01-31 ENCOUNTER — Telehealth: Payer: Self-pay

## 2014-01-31 DIAGNOSIS — L03115 Cellulitis of right lower limb: Secondary | ICD-10-CM | POA: Diagnosis not present

## 2014-01-31 DIAGNOSIS — I129 Hypertensive chronic kidney disease with stage 1 through stage 4 chronic kidney disease, or unspecified chronic kidney disease: Secondary | ICD-10-CM | POA: Diagnosis not present

## 2014-01-31 DIAGNOSIS — R600 Localized edema: Secondary | ICD-10-CM | POA: Diagnosis not present

## 2014-01-31 DIAGNOSIS — L03116 Cellulitis of left lower limb: Secondary | ICD-10-CM | POA: Diagnosis not present

## 2014-01-31 DIAGNOSIS — I2510 Atherosclerotic heart disease of native coronary artery without angina pectoris: Secondary | ICD-10-CM | POA: Diagnosis not present

## 2014-01-31 DIAGNOSIS — D471 Chronic myeloproliferative disease: Secondary | ICD-10-CM | POA: Diagnosis not present

## 2014-01-31 DIAGNOSIS — N183 Chronic kidney disease, stage 3 (moderate): Secondary | ICD-10-CM | POA: Diagnosis not present

## 2014-01-31 DIAGNOSIS — I5032 Chronic diastolic (congestive) heart failure: Secondary | ICD-10-CM | POA: Diagnosis not present

## 2014-01-31 NOTE — Telephone Encounter (Signed)
I agree with both referrals. Thank you

## 2014-01-31 NOTE — Telephone Encounter (Signed)
Tonya from Home health called, Pt was seen at ED on 01/25/2014 and was supposed to follow up with [place] next week, she did a home visit today and Pt could not get off the couch, Pt lives with his son, whom she stated did not help with the Pts care. Kenney Houseman would like to get a verbal order for home health care and a verbal order for a social worker since his son has not been helping take care of him.   Please advise.

## 2014-02-01 DIAGNOSIS — I5032 Chronic diastolic (congestive) heart failure: Secondary | ICD-10-CM | POA: Diagnosis not present

## 2014-02-01 DIAGNOSIS — R600 Localized edema: Secondary | ICD-10-CM | POA: Diagnosis not present

## 2014-02-01 DIAGNOSIS — I129 Hypertensive chronic kidney disease with stage 1 through stage 4 chronic kidney disease, or unspecified chronic kidney disease: Secondary | ICD-10-CM | POA: Diagnosis not present

## 2014-02-01 DIAGNOSIS — L03116 Cellulitis of left lower limb: Secondary | ICD-10-CM | POA: Diagnosis not present

## 2014-02-01 DIAGNOSIS — I2510 Atherosclerotic heart disease of native coronary artery without angina pectoris: Secondary | ICD-10-CM | POA: Diagnosis not present

## 2014-02-01 DIAGNOSIS — L03115 Cellulitis of right lower limb: Secondary | ICD-10-CM | POA: Diagnosis not present

## 2014-02-01 NOTE — Telephone Encounter (Signed)
LMOM for Tonya at [facility] with Dr. Ethel Rana verbal order for [place] and for the social worker in this situation.

## 2014-02-03 DIAGNOSIS — I129 Hypertensive chronic kidney disease with stage 1 through stage 4 chronic kidney disease, or unspecified chronic kidney disease: Secondary | ICD-10-CM | POA: Diagnosis not present

## 2014-02-03 DIAGNOSIS — I5032 Chronic diastolic (congestive) heart failure: Secondary | ICD-10-CM | POA: Diagnosis not present

## 2014-02-03 DIAGNOSIS — L03115 Cellulitis of right lower limb: Secondary | ICD-10-CM | POA: Diagnosis not present

## 2014-02-03 DIAGNOSIS — I2510 Atherosclerotic heart disease of native coronary artery without angina pectoris: Secondary | ICD-10-CM | POA: Diagnosis not present

## 2014-02-03 DIAGNOSIS — L03116 Cellulitis of left lower limb: Secondary | ICD-10-CM | POA: Diagnosis not present

## 2014-02-03 DIAGNOSIS — R600 Localized edema: Secondary | ICD-10-CM | POA: Diagnosis not present

## 2014-02-05 DIAGNOSIS — R600 Localized edema: Secondary | ICD-10-CM | POA: Diagnosis not present

## 2014-02-05 DIAGNOSIS — L03115 Cellulitis of right lower limb: Secondary | ICD-10-CM | POA: Diagnosis not present

## 2014-02-05 DIAGNOSIS — I5032 Chronic diastolic (congestive) heart failure: Secondary | ICD-10-CM | POA: Diagnosis not present

## 2014-02-05 DIAGNOSIS — I129 Hypertensive chronic kidney disease with stage 1 through stage 4 chronic kidney disease, or unspecified chronic kidney disease: Secondary | ICD-10-CM | POA: Diagnosis not present

## 2014-02-05 DIAGNOSIS — I2510 Atherosclerotic heart disease of native coronary artery without angina pectoris: Secondary | ICD-10-CM | POA: Diagnosis not present

## 2014-02-05 DIAGNOSIS — L03116 Cellulitis of left lower limb: Secondary | ICD-10-CM | POA: Diagnosis not present

## 2014-02-06 DIAGNOSIS — L03116 Cellulitis of left lower limb: Secondary | ICD-10-CM | POA: Diagnosis not present

## 2014-02-06 DIAGNOSIS — R600 Localized edema: Secondary | ICD-10-CM | POA: Diagnosis not present

## 2014-02-06 DIAGNOSIS — L03115 Cellulitis of right lower limb: Secondary | ICD-10-CM | POA: Diagnosis not present

## 2014-02-06 DIAGNOSIS — I2510 Atherosclerotic heart disease of native coronary artery without angina pectoris: Secondary | ICD-10-CM | POA: Diagnosis not present

## 2014-02-06 DIAGNOSIS — I5032 Chronic diastolic (congestive) heart failure: Secondary | ICD-10-CM | POA: Diagnosis not present

## 2014-02-06 DIAGNOSIS — I129 Hypertensive chronic kidney disease with stage 1 through stage 4 chronic kidney disease, or unspecified chronic kidney disease: Secondary | ICD-10-CM | POA: Diagnosis not present

## 2014-02-07 DIAGNOSIS — I2510 Atherosclerotic heart disease of native coronary artery without angina pectoris: Secondary | ICD-10-CM | POA: Diagnosis not present

## 2014-02-07 DIAGNOSIS — I129 Hypertensive chronic kidney disease with stage 1 through stage 4 chronic kidney disease, or unspecified chronic kidney disease: Secondary | ICD-10-CM | POA: Diagnosis not present

## 2014-02-07 DIAGNOSIS — L03116 Cellulitis of left lower limb: Secondary | ICD-10-CM | POA: Diagnosis not present

## 2014-02-07 DIAGNOSIS — L03115 Cellulitis of right lower limb: Secondary | ICD-10-CM | POA: Diagnosis not present

## 2014-02-07 DIAGNOSIS — R600 Localized edema: Secondary | ICD-10-CM | POA: Diagnosis not present

## 2014-02-07 DIAGNOSIS — I5032 Chronic diastolic (congestive) heart failure: Secondary | ICD-10-CM | POA: Diagnosis not present

## 2014-02-09 ENCOUNTER — Telehealth: Payer: Self-pay

## 2014-02-09 DIAGNOSIS — L03116 Cellulitis of left lower limb: Secondary | ICD-10-CM | POA: Diagnosis not present

## 2014-02-09 DIAGNOSIS — I5032 Chronic diastolic (congestive) heart failure: Secondary | ICD-10-CM | POA: Diagnosis not present

## 2014-02-09 DIAGNOSIS — I129 Hypertensive chronic kidney disease with stage 1 through stage 4 chronic kidney disease, or unspecified chronic kidney disease: Secondary | ICD-10-CM | POA: Diagnosis not present

## 2014-02-09 DIAGNOSIS — I2510 Atherosclerotic heart disease of native coronary artery without angina pectoris: Secondary | ICD-10-CM | POA: Diagnosis not present

## 2014-02-09 DIAGNOSIS — R600 Localized edema: Secondary | ICD-10-CM | POA: Diagnosis not present

## 2014-02-09 DIAGNOSIS — L03115 Cellulitis of right lower limb: Secondary | ICD-10-CM | POA: Diagnosis not present

## 2014-02-09 NOTE — Telephone Encounter (Signed)
Spoke with Nira Conn and gave her instructions for Pt per Dr. Larose Kells.

## 2014-02-09 NOTE — Telephone Encounter (Signed)
Tried Building control surveyor back at [facility], telephone was picked up by someone and then disconnected, will try contacting Heather again.

## 2014-02-09 NOTE — Telephone Encounter (Signed)
If nausea, vomiting, abdominal pain, fever or chills: Needs to be seen Otherwise we can try  --MiraLax 17 g daily for the next 2 weeks --Okay to use a glycerin suppository once daily x2 or Fleet enema once daily x2

## 2014-02-09 NOTE — Telephone Encounter (Signed)
[place] [phone] [facility] [place], [place]   Nira Conn called to see if Dr Larose Kells could call something in for constipation.

## 2014-02-09 NOTE — Telephone Encounter (Signed)
Please advise 

## 2014-02-12 ENCOUNTER — Other Ambulatory Visit: Payer: Self-pay | Admitting: Internal Medicine

## 2014-02-12 DIAGNOSIS — L03115 Cellulitis of right lower limb: Secondary | ICD-10-CM | POA: Diagnosis not present

## 2014-02-12 DIAGNOSIS — I129 Hypertensive chronic kidney disease with stage 1 through stage 4 chronic kidney disease, or unspecified chronic kidney disease: Secondary | ICD-10-CM | POA: Diagnosis not present

## 2014-02-12 DIAGNOSIS — I5032 Chronic diastolic (congestive) heart failure: Secondary | ICD-10-CM | POA: Diagnosis not present

## 2014-02-12 DIAGNOSIS — L03116 Cellulitis of left lower limb: Secondary | ICD-10-CM | POA: Diagnosis not present

## 2014-02-12 DIAGNOSIS — I2510 Atherosclerotic heart disease of native coronary artery without angina pectoris: Secondary | ICD-10-CM | POA: Diagnosis not present

## 2014-02-12 DIAGNOSIS — R600 Localized edema: Secondary | ICD-10-CM | POA: Diagnosis not present

## 2014-02-13 ENCOUNTER — Other Ambulatory Visit: Payer: Self-pay

## 2014-02-13 ENCOUNTER — Telehealth: Payer: Self-pay

## 2014-02-13 DIAGNOSIS — D759 Disease of blood and blood-forming organs, unspecified: Secondary | ICD-10-CM

## 2014-02-13 DIAGNOSIS — D649 Anemia, unspecified: Secondary | ICD-10-CM

## 2014-02-13 MED ORDER — ANAGRELIDE HCL 0.5 MG PO CAPS: 0.5000 mg | ORAL_CAPSULE | Freq: Every day | ORAL | Status: DC

## 2014-02-13 NOTE — Telephone Encounter (Signed)
Plan of Treatment Paperwork received from [facility] for Pt, paperwork completed by Dr. Larose Kells and faxed back to [facility]. Form placed in folder to be scanned.

## 2014-02-14 DIAGNOSIS — I129 Hypertensive chronic kidney disease with stage 1 through stage 4 chronic kidney disease, or unspecified chronic kidney disease: Secondary | ICD-10-CM | POA: Diagnosis not present

## 2014-02-14 DIAGNOSIS — I2510 Atherosclerotic heart disease of native coronary artery without angina pectoris: Secondary | ICD-10-CM | POA: Diagnosis not present

## 2014-02-14 DIAGNOSIS — R600 Localized edema: Secondary | ICD-10-CM | POA: Diagnosis not present

## 2014-02-14 DIAGNOSIS — L03116 Cellulitis of left lower limb: Secondary | ICD-10-CM | POA: Diagnosis not present

## 2014-02-14 DIAGNOSIS — L03115 Cellulitis of right lower limb: Secondary | ICD-10-CM | POA: Diagnosis not present

## 2014-02-14 DIAGNOSIS — I5032 Chronic diastolic (congestive) heart failure: Secondary | ICD-10-CM | POA: Diagnosis not present

## 2014-02-16 DIAGNOSIS — R600 Localized edema: Secondary | ICD-10-CM | POA: Diagnosis not present

## 2014-02-16 DIAGNOSIS — L03116 Cellulitis of left lower limb: Secondary | ICD-10-CM | POA: Diagnosis not present

## 2014-02-16 DIAGNOSIS — I5032 Chronic diastolic (congestive) heart failure: Secondary | ICD-10-CM | POA: Diagnosis not present

## 2014-02-16 DIAGNOSIS — I129 Hypertensive chronic kidney disease with stage 1 through stage 4 chronic kidney disease, or unspecified chronic kidney disease: Secondary | ICD-10-CM | POA: Diagnosis not present

## 2014-02-16 DIAGNOSIS — L03115 Cellulitis of right lower limb: Secondary | ICD-10-CM | POA: Diagnosis not present

## 2014-02-16 DIAGNOSIS — I2510 Atherosclerotic heart disease of native coronary artery without angina pectoris: Secondary | ICD-10-CM | POA: Diagnosis not present

## 2014-02-20 DIAGNOSIS — I5032 Chronic diastolic (congestive) heart failure: Secondary | ICD-10-CM | POA: Diagnosis not present

## 2014-02-20 DIAGNOSIS — L03116 Cellulitis of left lower limb: Secondary | ICD-10-CM | POA: Diagnosis not present

## 2014-02-20 DIAGNOSIS — L03115 Cellulitis of right lower limb: Secondary | ICD-10-CM | POA: Diagnosis not present

## 2014-02-20 DIAGNOSIS — R600 Localized edema: Secondary | ICD-10-CM | POA: Diagnosis not present

## 2014-02-20 DIAGNOSIS — I129 Hypertensive chronic kidney disease with stage 1 through stage 4 chronic kidney disease, or unspecified chronic kidney disease: Secondary | ICD-10-CM | POA: Diagnosis not present

## 2014-02-20 DIAGNOSIS — I2510 Atherosclerotic heart disease of native coronary artery without angina pectoris: Secondary | ICD-10-CM | POA: Diagnosis not present

## 2014-02-21 ENCOUNTER — Telehealth: Payer: Self-pay | Admitting: Internal Medicine

## 2014-02-21 NOTE — Telephone Encounter (Signed)
FYI

## 2014-02-21 NOTE — Telephone Encounter (Signed)
Caller name:  Diane-[facility] Relation to pt: Call back number: [phone] Pharmacy:  Reason for call:   Diane states that patient has missed a visit this week.

## 2014-02-21 NOTE — Telephone Encounter (Signed)
noted 

## 2014-02-22 ENCOUNTER — Ambulatory Visit (INDEPENDENT_AMBULATORY_CARE_PROVIDER_SITE_OTHER): Payer: Medicare Other | Admitting: Internal Medicine

## 2014-02-22 ENCOUNTER — Encounter: Payer: Self-pay | Admitting: Internal Medicine

## 2014-02-22 VITALS — BP 159/88 | HR 95 | Temp 98.1°F | Wt 260.2 lb

## 2014-02-22 DIAGNOSIS — I2510 Atherosclerotic heart disease of native coronary artery without angina pectoris: Secondary | ICD-10-CM

## 2014-02-22 DIAGNOSIS — R609 Edema, unspecified: Secondary | ICD-10-CM

## 2014-02-22 DIAGNOSIS — D471 Chronic myeloproliferative disease: Secondary | ICD-10-CM

## 2014-02-22 DIAGNOSIS — I5032 Chronic diastolic (congestive) heart failure: Secondary | ICD-10-CM | POA: Diagnosis not present

## 2014-02-22 LAB — BASIC METABOLIC PANEL
BUN: 31 mg/dL — AB (ref 6–23)
CHLORIDE: 104 meq/L (ref 96–112)
CO2: 28 meq/L (ref 19–32)
Calcium: 9.7 mg/dL (ref 8.4–10.5)
Creatinine, Ser: 1.2 mg/dL (ref 0.4–1.5)
GFR: 74.34 mL/min (ref >60.00)
GLUCOSE: 95 mg/dL (ref 70–99)
POTASSIUM: 4.5 meq/L (ref 3.5–5.1)
Sodium: 139 mEq/L (ref 135–145)

## 2014-02-22 NOTE — Progress Notes (Signed)
Pre visit review using our clinic review tool, if applicable. No additional management support is needed unless otherwise documented below in the visit note. 

## 2014-02-22 NOTE — Assessment & Plan Note (Addendum)
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 Subsequently he is saw Dr. 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 He had 3% blasts in the peripheral blood, which was confirmed by flow cytometry. Mr. Kaucher potentially could be a candidate for treatment with Jakafi; however, he decided not to discuss these issues and return to [place]. He is welcome to call our clinic and make an appointment in the future if he decides to do so. There will be no charge for this visit.   The patient has a difficult time grasping the concept of a malignancy, I tried to explain that to him but he would not accept any of my explanations. I asked him if I could talk w/ his family and he said no, states they don't  need to help him. He has an up coming appointment to see hematology.

## 2014-02-22 NOTE — Assessment & Plan Note (Signed)
Seems well control, check a BMP

## 2014-02-22 NOTE — Patient Instructions (Signed)
Get your blood work before you leave    Please come back to the office in 4 months  for a routine check up   Come back fasting

## 2014-02-22 NOTE — Progress Notes (Signed)
Subjective:    Patient ID: Juan [name], male    DOB: [birth year], 73 y.o.   MRN: [id number]  DOS:  02/22/2014 Type of visit - description : rov, hosp f/u Admitted to the hospital last month, chart reviewed. BRIEF HOSPITAL COURSE:  Active Problems:  ? Cellulitis of right lower extremity  - Not sure this patient actually has cellulitis-his main complaint was pain in his right ankle area. Patient has chronic lower extremity edema, and stasis dermatitis and a lot of chronic skin changes. On evaluation by the admitting physician, patient was felt to have cellulitis of both bilateral lower extremities and started on IV vancomycin and Zosyn. However on my evaluation the very next day, no obvious areas of cellulitis was seen, however patient was already on vancomycin/Zosyn and may have already improved. By day of discharge, significantly improved, with only evidence of chronic skin changes, in any event, patient will be discharged on oral doxycycline. Please note ILower extremity Dopplers negative for DVT.  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  Anemia  - Secondary to chronic disease  - Hemoglobin stable, continue to monitor periodically  Chronic kidney disease stage III  - Monitor electrolytes periodically, creatinine close to baseline.  History of CAD  - Reviewed outpatient cardiology notes-had a normal Myoview in 2007.  - Stable without any chest pain or shortness of breath  Chronic lower extremity edema, stasis dermatitis  - Chronic issue-as noted above-current exam consistent with chronic stasis dermatitis changes.  - Seen by wound care this  admission-current recommendations are Eucerin cream to assist with removal of nonviable skin Q day after bathing   ROS Since he left the hospital he is at baseline. Hematology notes are reviewed. BP noted to be elevated, reports that his BP has been checked at home by nurses and is always wnl  Past Medical History  Diagnosis Date  . CAD (coronary artery disease)     dx elsewheer in past, no documentation. Non-ischemic myovue 2007  . CHF (congestive heart failure)     dx elsewhere, no documentation. Normal EF by previous echo. Trival AS needs f/u ECHO by 2012  . Hypertension   . Sleep apnea, obstructive     at some point used CPAP, was d/c  years ago  . Anemia   . History of thrombocytosis   . Allergic rhinitis   . Edema     R>L leg, u/s 5-12 neg for DVT  . Hemorrhoid   . Type II diabetes mellitus   . Sinus congestion   . Migraine     "once/wk at least" (07/11/2013)  . Shortness of breath     Past Surgical History  Procedure Laterality Date  . Toe surgery Right     "tried to straighten out big toe" (07/11/2013)    History   Social History  . Marital Status: Widowed    Spouse Name: N/A    Number of Children: 2  . Years of Education: N/A   Occupational History  . retired    .     Social History Main Topics  . Smoking status: Former Smoker -- 0.25 packs/day for 12 years    Types: Cigarettes  Quit date: 05/18/1966  . Smokeless tobacco: Never Used  . Alcohol Use: No  . Drug Use: No  . Sexual Activity: Yes   Other Topics Concern  . Not on file   Social History Narrative   Moved from [place] 2006   Widow 2007   Son lives with him         Medication List       This list is accurate as of: 02/22/14  5:19 PM.  Always use your most recent med list.               acetaminophen 500 MG tablet  Commonly known as:  TYLENOL  Take 500-1,000 mg by mouth every 6 (six) hours as needed for moderate pain or headache.     anagrelide 0.5 MG capsule  Commonly known  as:  AGRYLIN  Take 1 capsule (0.5 mg total) by mouth daily.     b complex vitamins capsule  Take 1 capsule by mouth daily.     bacitracin 500 UNIT/GM ointment  Apply 1 application topically daily as needed for wound care.     Ferrous Sulfate Dried 200 (65 FE) MG Tabs  Take 1 tablet by mouth daily.     fluticasone 50 MCG/ACT nasal spray  Commonly known as:  FLONASE  Place 2 sprays into both nostrils daily.     folic acid 1 MG tablet  Commonly known as:  FOLVITE  Take 1 mg by mouth daily.     furosemide 40 MG tablet  Commonly known as:  LASIX  TAKE 3 TABLETS BY MOUTH DAILY     hydrocerin Crea  Apply Eucerin cream to BLE Q day after bathing and roughly towel drying to remove loose skin     HYDROcodone-acetaminophen 5-325 MG per tablet  Commonly known as:  NORCO/VICODIN  Take 1 tablet by mouth every 6 (six) hours as needed for moderate pain.     lisinopril 20 MG tablet  Commonly known as:  PRINIVIL,ZESTRIL  Take 20 mg by mouth daily.     potassium chloride SA 20 MEQ tablet  Commonly known as:  K-DUR,KLOR-CON  Take 20 mEq by mouth daily.           Objective:   Physical Exam BP 159/88  Pulse 95  Temp(Src) 98.1 F (36.7 C) (Oral)  Wt 260 lb 4 oz (118.049 kg)  SpO2 98%  General -- alert, well-developed, NAD.   Lungs -- normal respiratory effort, no intercostal retractions, no accessory muscle use, and normal breath sounds.  Heart-- normal rate, regular rhythm, no murmur.  Extremities-- hard edema bilaterally , Chronic skin changes in both legs not that, no warmness er  tenderness Neurologic--  alert & oriented X3.   Psych--   No anxious or depressed appearing.         Assessment & Plan:   Today , I spent more than 31   min with the patient: >50% of the time counseling regards needs for further treatment for blood dyscrasia, Also reviewing the chart and labs ordered by other providers

## 2014-02-22 NOTE — Assessment & Plan Note (Signed)
Recently  admitted with a question of cellulitis, currently at baseline. While at the hospital he saw the Wound Care team and they recommended eucerin

## 2014-02-23 DIAGNOSIS — L03116 Cellulitis of left lower limb: Secondary | ICD-10-CM | POA: Diagnosis not present

## 2014-02-23 DIAGNOSIS — I2510 Atherosclerotic heart disease of native coronary artery without angina pectoris: Secondary | ICD-10-CM | POA: Diagnosis not present

## 2014-02-23 DIAGNOSIS — I129 Hypertensive chronic kidney disease with stage 1 through stage 4 chronic kidney disease, or unspecified chronic kidney disease: Secondary | ICD-10-CM | POA: Diagnosis not present

## 2014-02-23 DIAGNOSIS — R600 Localized edema: Secondary | ICD-10-CM | POA: Diagnosis not present

## 2014-02-23 DIAGNOSIS — L03115 Cellulitis of right lower limb: Secondary | ICD-10-CM | POA: Diagnosis not present

## 2014-02-23 DIAGNOSIS — I5032 Chronic diastolic (congestive) heart failure: Secondary | ICD-10-CM | POA: Diagnosis not present

## 2014-02-26 DIAGNOSIS — R600 Localized edema: Secondary | ICD-10-CM | POA: Diagnosis not present

## 2014-02-26 DIAGNOSIS — L03116 Cellulitis of left lower limb: Secondary | ICD-10-CM | POA: Diagnosis not present

## 2014-02-26 DIAGNOSIS — I5032 Chronic diastolic (congestive) heart failure: Secondary | ICD-10-CM | POA: Diagnosis not present

## 2014-02-26 DIAGNOSIS — L03115 Cellulitis of right lower limb: Secondary | ICD-10-CM | POA: Diagnosis not present

## 2014-02-26 DIAGNOSIS — I2510 Atherosclerotic heart disease of native coronary artery without angina pectoris: Secondary | ICD-10-CM | POA: Diagnosis not present

## 2014-02-26 DIAGNOSIS — I129 Hypertensive chronic kidney disease with stage 1 through stage 4 chronic kidney disease, or unspecified chronic kidney disease: Secondary | ICD-10-CM | POA: Diagnosis not present

## 2014-02-27 DIAGNOSIS — L03115 Cellulitis of right lower limb: Secondary | ICD-10-CM | POA: Diagnosis not present

## 2014-02-27 DIAGNOSIS — R600 Localized edema: Secondary | ICD-10-CM | POA: Diagnosis not present

## 2014-02-27 DIAGNOSIS — I129 Hypertensive chronic kidney disease with stage 1 through stage 4 chronic kidney disease, or unspecified chronic kidney disease: Secondary | ICD-10-CM | POA: Diagnosis not present

## 2014-02-27 DIAGNOSIS — I2510 Atherosclerotic heart disease of native coronary artery without angina pectoris: Secondary | ICD-10-CM | POA: Diagnosis not present

## 2014-02-27 DIAGNOSIS — L03116 Cellulitis of left lower limb: Secondary | ICD-10-CM | POA: Diagnosis not present

## 2014-02-27 DIAGNOSIS — I5032 Chronic diastolic (congestive) heart failure: Secondary | ICD-10-CM | POA: Diagnosis not present

## 2014-02-28 DIAGNOSIS — I129 Hypertensive chronic kidney disease with stage 1 through stage 4 chronic kidney disease, or unspecified chronic kidney disease: Secondary | ICD-10-CM | POA: Diagnosis not present

## 2014-02-28 DIAGNOSIS — I5032 Chronic diastolic (congestive) heart failure: Secondary | ICD-10-CM | POA: Diagnosis not present

## 2014-02-28 DIAGNOSIS — L03116 Cellulitis of left lower limb: Secondary | ICD-10-CM | POA: Diagnosis not present

## 2014-02-28 DIAGNOSIS — R600 Localized edema: Secondary | ICD-10-CM | POA: Diagnosis not present

## 2014-02-28 DIAGNOSIS — I2510 Atherosclerotic heart disease of native coronary artery without angina pectoris: Secondary | ICD-10-CM | POA: Diagnosis not present

## 2014-02-28 DIAGNOSIS — L03115 Cellulitis of right lower limb: Secondary | ICD-10-CM | POA: Diagnosis not present

## 2014-03-01 DIAGNOSIS — I5032 Chronic diastolic (congestive) heart failure: Secondary | ICD-10-CM | POA: Diagnosis not present

## 2014-03-01 DIAGNOSIS — R600 Localized edema: Secondary | ICD-10-CM | POA: Diagnosis not present

## 2014-03-01 DIAGNOSIS — L03116 Cellulitis of left lower limb: Secondary | ICD-10-CM | POA: Diagnosis not present

## 2014-03-01 DIAGNOSIS — I2510 Atherosclerotic heart disease of native coronary artery without angina pectoris: Secondary | ICD-10-CM | POA: Diagnosis not present

## 2014-03-01 DIAGNOSIS — L03115 Cellulitis of right lower limb: Secondary | ICD-10-CM | POA: Diagnosis not present

## 2014-03-01 DIAGNOSIS — I129 Hypertensive chronic kidney disease with stage 1 through stage 4 chronic kidney disease, or unspecified chronic kidney disease: Secondary | ICD-10-CM | POA: Diagnosis not present

## 2014-03-07 ENCOUNTER — Telehealth: Payer: Self-pay | Admitting: Internal Medicine

## 2014-03-07 DIAGNOSIS — L03115 Cellulitis of right lower limb: Secondary | ICD-10-CM | POA: Diagnosis not present

## 2014-03-07 DIAGNOSIS — I2510 Atherosclerotic heart disease of native coronary artery without angina pectoris: Secondary | ICD-10-CM | POA: Diagnosis not present

## 2014-03-07 DIAGNOSIS — I5032 Chronic diastolic (congestive) heart failure: Secondary | ICD-10-CM | POA: Diagnosis not present

## 2014-03-07 DIAGNOSIS — L03116 Cellulitis of left lower limb: Secondary | ICD-10-CM | POA: Diagnosis not present

## 2014-03-07 DIAGNOSIS — I129 Hypertensive chronic kidney disease with stage 1 through stage 4 chronic kidney disease, or unspecified chronic kidney disease: Secondary | ICD-10-CM | POA: Diagnosis not present

## 2014-03-07 DIAGNOSIS — R600 Localized edema: Secondary | ICD-10-CM | POA: Diagnosis not present

## 2014-03-07 NOTE — Telephone Encounter (Signed)
Okay for F/U care?

## 2014-03-07 NOTE — Telephone Encounter (Signed)
I am not sure what she is referring to, he is my patient, Next routine visit is 06-2014, he can call anytime for any appointment if he has an acute problem

## 2014-03-07 NOTE — Telephone Encounter (Signed)
Caller name: heather Relation to pt:RN Call back number:  [phone]   Reason for call:  Has been seeing for wound care; wound has healed.  Pt is going to be dc next week.  Would like to know if Provider will continue follow up care as needed.

## 2014-03-08 NOTE — Telephone Encounter (Signed)
Spoke with Nira Conn, informed her that Dr. Larose Kells is Pts PCP and will need to be seen if he is still having acute problem.

## 2014-03-16 DIAGNOSIS — L03116 Cellulitis of left lower limb: Secondary | ICD-10-CM | POA: Diagnosis not present

## 2014-03-16 DIAGNOSIS — I2510 Atherosclerotic heart disease of native coronary artery without angina pectoris: Secondary | ICD-10-CM | POA: Diagnosis not present

## 2014-03-16 DIAGNOSIS — R600 Localized edema: Secondary | ICD-10-CM | POA: Diagnosis not present

## 2014-03-16 DIAGNOSIS — I5032 Chronic diastolic (congestive) heart failure: Secondary | ICD-10-CM | POA: Diagnosis not present

## 2014-03-16 DIAGNOSIS — I129 Hypertensive chronic kidney disease with stage 1 through stage 4 chronic kidney disease, or unspecified chronic kidney disease: Secondary | ICD-10-CM | POA: Diagnosis not present

## 2014-03-16 DIAGNOSIS — L03115 Cellulitis of right lower limb: Secondary | ICD-10-CM | POA: Diagnosis not present

## 2014-03-18 DIAGNOSIS — I7121 Aneurysm of the ascending aorta, without rupture: Secondary | ICD-10-CM

## 2014-03-18 DIAGNOSIS — I712 Thoracic aortic aneurysm, without rupture: Secondary | ICD-10-CM

## 2014-03-18 HISTORY — DX: Aneurysm of the ascending aorta, without rupture: I71.21

## 2014-03-18 HISTORY — DX: Thoracic aortic aneurysm, without rupture: I71.2

## 2014-03-19 ENCOUNTER — Other Ambulatory Visit: Payer: Self-pay

## 2014-03-19 MED ORDER — FUROSEMIDE 40 MG PO TABS: ORAL_TABLET | ORAL | Status: DC

## 2014-03-29 ENCOUNTER — Emergency Department (HOSPITAL_COMMUNITY): Payer: Medicare Other

## 2014-03-29 ENCOUNTER — Inpatient Hospital Stay (HOSPITAL_COMMUNITY)
Admission: EM | Admit: 2014-03-29 | Discharge: 2014-04-10 | DRG: 871 | Disposition: A | Discharge status: Skilled Nursing Facility | Payer: Medicare Other | Attending: Internal Medicine | Admitting: Internal Medicine

## 2014-03-29 ENCOUNTER — Encounter (HOSPITAL_COMMUNITY): Payer: Self-pay | Admitting: *Deleted

## 2014-03-29 DIAGNOSIS — R627 Adult failure to thrive: Secondary | ICD-10-CM | POA: Diagnosis present

## 2014-03-29 DIAGNOSIS — D638 Anemia in other chronic diseases classified elsewhere: Secondary | ICD-10-CM | POA: Diagnosis present

## 2014-03-29 DIAGNOSIS — L0390 Cellulitis, unspecified: Secondary | ICD-10-CM | POA: Diagnosis present

## 2014-03-29 DIAGNOSIS — R0902 Hypoxemia: Secondary | ICD-10-CM

## 2014-03-29 DIAGNOSIS — R0602 Shortness of breath: Secondary | ICD-10-CM | POA: Diagnosis not present

## 2014-03-29 DIAGNOSIS — R040 Epistaxis: Secondary | ICD-10-CM | POA: Diagnosis not present

## 2014-03-29 DIAGNOSIS — Z515 Encounter for palliative care: Secondary | ICD-10-CM | POA: Diagnosis not present

## 2014-03-29 DIAGNOSIS — G629 Polyneuropathy, unspecified: Secondary | ICD-10-CM | POA: Diagnosis present

## 2014-03-29 DIAGNOSIS — Z6833 Body mass index (BMI) 33.0-33.9, adult: Secondary | ICD-10-CM | POA: Diagnosis not present

## 2014-03-29 DIAGNOSIS — R609 Edema, unspecified: Secondary | ICD-10-CM

## 2014-03-29 DIAGNOSIS — J811 Chronic pulmonary edema: Secondary | ICD-10-CM | POA: Diagnosis not present

## 2014-03-29 DIAGNOSIS — R5081 Fever presenting with conditions classified elsewhere: Secondary | ICD-10-CM | POA: Diagnosis not present

## 2014-03-29 DIAGNOSIS — I129 Hypertensive chronic kidney disease with stage 1 through stage 4 chronic kidney disease, or unspecified chronic kidney disease: Secondary | ICD-10-CM | POA: Diagnosis present

## 2014-03-29 DIAGNOSIS — L0291 Cutaneous abscess, unspecified: Secondary | ICD-10-CM

## 2014-03-29 DIAGNOSIS — L309 Dermatitis, unspecified: Secondary | ICD-10-CM | POA: Diagnosis present

## 2014-03-29 DIAGNOSIS — E440 Moderate protein-calorie malnutrition: Secondary | ICD-10-CM | POA: Diagnosis present

## 2014-03-29 DIAGNOSIS — D473 Essential (hemorrhagic) thrombocythemia: Secondary | ICD-10-CM | POA: Diagnosis not present

## 2014-03-29 DIAGNOSIS — D7289 Other specified disorders of white blood cells: Secondary | ICD-10-CM | POA: Diagnosis not present

## 2014-03-29 DIAGNOSIS — J984 Other disorders of lung: Secondary | ICD-10-CM | POA: Diagnosis not present

## 2014-03-29 DIAGNOSIS — N189 Chronic kidney disease, unspecified: Secondary | ICD-10-CM | POA: Diagnosis not present

## 2014-03-29 DIAGNOSIS — R600 Localized edema: Secondary | ICD-10-CM | POA: Diagnosis not present

## 2014-03-29 DIAGNOSIS — N183 Chronic kidney disease, stage 3 (moderate): Secondary | ICD-10-CM | POA: Diagnosis present

## 2014-03-29 DIAGNOSIS — G4733 Obstructive sleep apnea (adult) (pediatric): Secondary | ICD-10-CM | POA: Diagnosis not present

## 2014-03-29 DIAGNOSIS — I2510 Atherosclerotic heart disease of native coronary artery without angina pectoris: Secondary | ICD-10-CM | POA: Diagnosis present

## 2014-03-29 DIAGNOSIS — Z9119 Patient's noncompliance with other medical treatment and regimen: Secondary | ICD-10-CM | POA: Diagnosis present

## 2014-03-29 DIAGNOSIS — L03115 Cellulitis of right lower limb: Secondary | ICD-10-CM | POA: Diagnosis not present

## 2014-03-29 DIAGNOSIS — R5381 Other malaise: Secondary | ICD-10-CM | POA: Diagnosis not present

## 2014-03-29 DIAGNOSIS — N19 Unspecified kidney failure: Secondary | ICD-10-CM

## 2014-03-29 DIAGNOSIS — I517 Cardiomegaly: Secondary | ICD-10-CM | POA: Diagnosis not present

## 2014-03-29 DIAGNOSIS — D471 Chronic myeloproliferative disease: Secondary | ICD-10-CM | POA: Diagnosis present

## 2014-03-29 DIAGNOSIS — I5032 Chronic diastolic (congestive) heart failure: Secondary | ICD-10-CM | POA: Diagnosis present

## 2014-03-29 DIAGNOSIS — Z23 Encounter for immunization: Secondary | ICD-10-CM

## 2014-03-29 DIAGNOSIS — A419 Sepsis, unspecified organism: Principal | ICD-10-CM | POA: Diagnosis present

## 2014-03-29 DIAGNOSIS — N179 Acute kidney failure, unspecified: Secondary | ICD-10-CM | POA: Diagnosis not present

## 2014-03-29 DIAGNOSIS — L89312 Pressure ulcer of right buttock, stage 2: Secondary | ICD-10-CM | POA: Diagnosis not present

## 2014-03-29 DIAGNOSIS — D631 Anemia in chronic kidney disease: Secondary | ICD-10-CM | POA: Diagnosis not present

## 2014-03-29 DIAGNOSIS — E119 Type 2 diabetes mellitus without complications: Secondary | ICD-10-CM | POA: Diagnosis present

## 2014-03-29 DIAGNOSIS — L03116 Cellulitis of left lower limb: Secondary | ICD-10-CM | POA: Diagnosis present

## 2014-03-29 DIAGNOSIS — Z87891 Personal history of nicotine dependence: Secondary | ICD-10-CM | POA: Diagnosis not present

## 2014-03-29 DIAGNOSIS — I872 Venous insufficiency (chronic) (peripheral): Secondary | ICD-10-CM | POA: Insufficient documentation

## 2014-03-29 DIAGNOSIS — D630 Anemia in neoplastic disease: Secondary | ICD-10-CM | POA: Diagnosis present

## 2014-03-29 DIAGNOSIS — Z856 Personal history of leukemia: Secondary | ICD-10-CM | POA: Diagnosis not present

## 2014-03-29 DIAGNOSIS — N170 Acute kidney failure with tubular necrosis: Secondary | ICD-10-CM | POA: Diagnosis present

## 2014-03-29 DIAGNOSIS — L8992 Pressure ulcer of unspecified site, stage 2: Secondary | ICD-10-CM | POA: Diagnosis present

## 2014-03-29 DIAGNOSIS — M6281 Muscle weakness (generalized): Secondary | ICD-10-CM | POA: Diagnosis not present

## 2014-03-29 DIAGNOSIS — R509 Fever, unspecified: Secondary | ICD-10-CM | POA: Diagnosis not present

## 2014-03-29 DIAGNOSIS — M79672 Pain in left foot: Secondary | ICD-10-CM | POA: Diagnosis not present

## 2014-03-29 DIAGNOSIS — E46 Unspecified protein-calorie malnutrition: Secondary | ICD-10-CM | POA: Diagnosis not present

## 2014-03-29 DIAGNOSIS — M7989 Other specified soft tissue disorders: Secondary | ICD-10-CM | POA: Diagnosis not present

## 2014-03-29 DIAGNOSIS — Z008 Encounter for other general examination: Secondary | ICD-10-CM | POA: Diagnosis not present

## 2014-03-29 DIAGNOSIS — D72828 Other elevated white blood cell count: Secondary | ICD-10-CM | POA: Diagnosis not present

## 2014-03-29 DIAGNOSIS — R531 Weakness: Secondary | ICD-10-CM | POA: Diagnosis not present

## 2014-03-29 DIAGNOSIS — M79606 Pain in leg, unspecified: Secondary | ICD-10-CM | POA: Diagnosis not present

## 2014-03-29 DIAGNOSIS — C9500 Acute leukemia of unspecified cell type not having achieved remission: Secondary | ICD-10-CM | POA: Diagnosis not present

## 2014-03-29 DIAGNOSIS — R630 Anorexia: Secondary | ICD-10-CM | POA: Diagnosis not present

## 2014-03-29 DIAGNOSIS — D72829 Elevated white blood cell count, unspecified: Secondary | ICD-10-CM | POA: Diagnosis present

## 2014-03-29 DIAGNOSIS — R000 Tachycardia, unspecified: Secondary | ICD-10-CM | POA: Diagnosis not present

## 2014-03-29 LAB — CBC WITH DIFFERENTIAL/PLATELET
Band Neutrophils: 4 % (ref 0–10)
Basophils Absolute: 2 10*3/uL — ABNORMAL HIGH (ref 0.0–0.1)
Basophils Relative: 11 % — ABNORMAL HIGH (ref 0–1)
Blasts: 5 %
Eosinophils Absolute: 0.5 10*3/uL (ref 0.0–0.7)
Eosinophils Relative: 3 % (ref 0–5)
HCT: 31.9 % — ABNORMAL LOW (ref 39.0–52.0)
Hemoglobin: 10.3 g/dL — ABNORMAL LOW (ref 13.0–17.0)
Lymphocytes Relative: 16 % (ref 12–46)
Lymphs Abs: 2.8 10*3/uL (ref 0.7–4.0)
MCH: 25.9 pg — ABNORMAL LOW (ref 26.0–34.0)
MCHC: 32.3 g/dL (ref 30.0–36.0)
MCV: 80.2 fL (ref 78.0–100.0)
Metamyelocytes Relative: 3 %
Monocytes Absolute: 1.8 10*3/uL — ABNORMAL HIGH (ref 0.1–1.0)
Monocytes Relative: 10 % (ref 3–12)
Myelocytes: 5 %
Neutro Abs: 9.8 10*3/uL — ABNORMAL HIGH (ref 1.7–7.7)
Neutrophils Relative %: 43 % (ref 43–77)
Platelets: 292 10*3/uL (ref 150–400)
Promyelocytes Absolute: 0 %
RBC: 3.98 MIL/uL — ABNORMAL LOW (ref 4.22–5.81)
RDW: 19.9 % — ABNORMAL HIGH (ref 11.5–15.5)
WBC: 17.8 10*3/uL — ABNORMAL HIGH (ref 4.0–10.5)
nRBC: 0 /100 WBC

## 2014-03-29 LAB — BASIC METABOLIC PANEL
Anion gap: 18 — ABNORMAL HIGH (ref 5–15)
BUN: 34 mg/dL — ABNORMAL HIGH (ref 6–23)
CO2: 20 mEq/L (ref 19–32)
Calcium: 10.1 mg/dL (ref 8.4–10.5)
Chloride: 101 mEq/L (ref 96–112)
Creatinine, Ser: 1.32 mg/dL (ref 0.50–1.35)
GFR calc Af Amer: 61 mL/min — ABNORMAL LOW (ref >90)
GFR calc non Af Amer: 52 mL/min — ABNORMAL LOW (ref >90)
Glucose, Bld: 118 mg/dL — ABNORMAL HIGH (ref 70–99)
Potassium: 3.8 mEq/L (ref 3.7–5.3)
Sodium: 139 mEq/L (ref 137–147)

## 2014-03-29 LAB — URINALYSIS, ROUTINE W REFLEX MICROSCOPIC
Bilirubin Urine: NEGATIVE
Glucose, UA: NEGATIVE mg/dL
Ketones, ur: NEGATIVE mg/dL
Leukocytes, UA: NEGATIVE
Nitrite: NEGATIVE
Protein, ur: >300 mg/dL — AB
Specific Gravity, Urine: 1.019 (ref 1.005–1.030)
Urobilinogen, UA: 0.2 mg/dL (ref 0.0–1.0)
pH: 5 (ref 5.0–8.0)

## 2014-03-29 LAB — URINE MICROSCOPIC-ADD ON

## 2014-03-29 LAB — LACTIC ACID, PLASMA: Lactic Acid, Venous: 2.4 mmol/L — ABNORMAL HIGH (ref 0.5–2.2)

## 2014-03-29 LAB — TROPONIN I: Troponin I: <0.3 ng/mL (ref <0.30)

## 2014-03-29 LAB — PATHOLOGIST SMEAR REVIEW

## 2014-03-29 LAB — MRSA PCR SCREENING: MRSA BY PCR: NEGATIVE

## 2014-03-29 LAB — PRO B NATRIURETIC PEPTIDE: Pro B Natriuretic peptide (BNP): 6628 pg/mL — ABNORMAL HIGH (ref 0–125)

## 2014-03-29 MED ORDER — ACETAMINOPHEN 325 MG PO TABS
650.0000 mg | ORAL_TABLET | Freq: Once | ORAL | Status: AC
  Administered 2014-03-29: 650 mg via ORAL
  Filled 2014-03-29: qty 2

## 2014-03-29 MED ORDER — VANCOMYCIN HCL 10 G IV SOLR
2000.0000 mg | Freq: Once | INTRAVENOUS | Status: AC
  Administered 2014-03-29: 2000 mg via INTRAVENOUS
  Filled 2014-03-29: qty 2000

## 2014-03-29 MED ORDER — SODIUM CHLORIDE 0.9 % IV SOLN: INTRAVENOUS | Status: DC

## 2014-03-29 MED ORDER — SODIUM CHLORIDE 0.9 % IJ SOLN
3.0000 mL | Freq: Two times a day (BID) | INTRAMUSCULAR | Status: DC
  Administered 2014-03-29 – 2014-04-09 (×11): 3 mL via INTRAVENOUS

## 2014-03-29 MED ORDER — MORPHINE SULFATE 2 MG/ML IJ SOLN
2.0000 mg | INTRAMUSCULAR | Status: DC | PRN
  Administered 2014-03-30 – 2014-04-09 (×14): 2 mg via INTRAVENOUS
  Filled 2014-03-29 (×14): qty 1

## 2014-03-29 MED ORDER — ACETAMINOPHEN 325 MG PO TABS
650.0000 mg | ORAL_TABLET | Freq: Four times a day (QID) | ORAL | Status: DC | PRN
  Administered 2014-03-30 – 2014-04-07 (×6): 650 mg via ORAL
  Filled 2014-03-29 (×6): qty 2

## 2014-03-29 MED ORDER — ACETAMINOPHEN 650 MG RE SUPP: 650.0000 mg | Freq: Four times a day (QID) | RECTAL | Status: DC | PRN

## 2014-03-29 MED ORDER — PIPERACILLIN-TAZOBACTAM 3.375 G IVPB
3.3750 g | Freq: Three times a day (TID) | INTRAVENOUS | Status: DC
  Administered 2014-03-30 (×2): 3.375 g via INTRAVENOUS
  Filled 2014-03-29 (×4): qty 50

## 2014-03-29 MED ORDER — ONDANSETRON HCL 4 MG/2ML IJ SOLN
4.0000 mg | Freq: Once | INTRAMUSCULAR | Status: AC
  Administered 2014-03-29: 4 mg via INTRAVENOUS
  Filled 2014-03-29: qty 2

## 2014-03-29 MED ORDER — VANCOMYCIN HCL IN DEXTROSE 1-5 GM/200ML-% IV SOLN
1000.0000 mg | Freq: Two times a day (BID) | INTRAVENOUS | Status: DC
  Administered 2014-03-30 (×2): 1000 mg via INTRAVENOUS
  Filled 2014-03-29 (×3): qty 200

## 2014-03-29 MED ORDER — MORPHINE SULFATE 4 MG/ML IJ SOLN
8.0000 mg | Freq: Once | INTRAMUSCULAR | Status: AC
  Administered 2014-03-29: 8 mg via INTRAVENOUS
  Filled 2014-03-29: qty 2

## 2014-03-29 MED ORDER — VANCOMYCIN HCL IN DEXTROSE 1-5 GM/200ML-% IV SOLN: 1000.0000 mg | Freq: Once | INTRAVENOUS | Status: DC

## 2014-03-29 MED ORDER — PIPERACILLIN-TAZOBACTAM 3.375 G IVPB 30 MIN
3.3750 g | Freq: Once | INTRAVENOUS | Status: AC
  Administered 2014-03-29: 3.375 g via INTRAVENOUS
  Filled 2014-03-29: qty 50

## 2014-03-29 MED ORDER — HEPARIN SODIUM (PORCINE) 5000 UNIT/ML IJ SOLN
5000.0000 [IU] | Freq: Three times a day (TID) | INTRAMUSCULAR | Status: DC
  Administered 2014-03-29 – 2014-04-10 (×35): 5000 [IU] via SUBCUTANEOUS
  Filled 2014-03-29 (×41): qty 1

## 2014-03-29 NOTE — Progress Notes (Signed)
ANTIBIOTIC CONSULT NOTE - INITIAL  Pharmacy Consult for vancomycin/zosyn Indication: cellulitis  No Known Allergies  Patient Measurements:  Vital Signs: Temp: 100.4 F (38 C) (11/12 1619) Temp Source: Tympanic (11/12 1619) BP: 147/76 mmHg (11/12 1630) Pulse Rate: 124 (11/12 1630) Intake/Output from previous day:   Intake/Output from this shift:    Labs:  Recent Labs  03/29/14 1549  WBC 17.8*  HGB 10.3*  PLT 292  CREATININE 1.32   CrCl cannot be calculated (Unknown ideal weight.). No results for input(s): VANCOTROUGH, VANCOPEAK, VANCORANDOM, GENTTROUGH, GENTPEAK, GENTRANDOM, TOBRATROUGH, TOBRAPEAK, TOBRARND, AMIKACINPEAK, AMIKACINTROU, AMIKACIN in the last 72 hours.   Microbiology: No results found for this or any previous visit (from the past 720 hour(s)).  Medical History: Past Medical History  Diagnosis Date  . CAD (coronary artery disease)     dx elsewheer in past, no documentation. Non-ischemic myovue 2007  . CHF (congestive heart failure)     dx elsewhere, no documentation. Normal EF by previous echo. Trival AS needs f/u ECHO by 2012  . Hypertension   . Sleep apnea, obstructive     at some point used CPAP, was d/c  years ago  . Anemia   . History of thrombocytosis   . Allergic rhinitis   . Edema     R>L leg, u/s 5-12 neg for DVT  . Hemorrhoid   . Type II diabetes mellitus   . Sinus congestion   . Migraine     "once/wk at least" (07/11/2013)  . Shortness of breath    Assessment: 73 yo m who presented to the ED on 11/12 with a c/o leg swelling x 3 days.  Pharmacy is consulted to begin vancomycin and zosyn for cellulitis.  Patient has already received a 2 gm IV load of vancomycin and a 3.375 gm IV load of zosyn (30-min infusion).  Wbc 17.8, tmax 100.4, SCr 1.32.   Goal of Therapy:  Vancomycin trough level 10-15 mcg/ml  Plan:  Vancomycin 1,000 mg IV q12hrs Zosyn 3.375 gm IV q8hrs (4-hr infusion) VT at Css Monitor CBC, temperature curve, renal  function, clinical course  Sherial Ebrahim L. Nicole Kindred, PharmD Clinical Pharmacy Resident Pager: [phone] 03/29/2014 6:36 PM

## 2014-03-29 NOTE — ED Notes (Signed)
Pt to ED from home c/o leg swelling, increased x 3 days. Bilateral legs warm to touch, oozing; pt unable to bear weight. Lives with son.

## 2014-03-29 NOTE — ED Notes (Signed)
Pt c/o indigestion then vomited [employer]

## 2014-03-29 NOTE — Progress Notes (Signed)
Spoke with ED RN who btained PIV, IV Team not needed at this time. Lorri Frederick, RN

## 2014-03-29 NOTE — ED Notes (Signed)
Pt refused to give urine and refused rectal temp. Pt stated he wanted to be left alone for a couple hours so he can get some sleep. Urinal at bedside.

## 2014-03-29 NOTE — ED Provider Notes (Signed)
CSN: [id number]     Arrival date & time 03/29/14  1506 History   First MD Initiated Contact with Patient 03/29/14 1506     No chief complaint on file.    (Consider location/radiation/quality/duration/timing/severity/associated sxs/prior Treatment) HPI   73yM with pain in RLE. Chronic LE edema, R>L. Pt reports worsening over past week. Increased pain in R leg/foot to point where unable to bear weight on it now. Denies trauma. Shaking chills. Mild nausea. No vomiting. Mild HA. Reports compliance with medications.   Past Medical History  Diagnosis Date  . CAD (coronary artery disease)     dx elsewheer in past, no documentation. Non-ischemic myovue 2007  . CHF (congestive heart failure)     dx elsewhere, no documentation. Normal EF by previous echo. Trival AS needs f/u ECHO by 2012  . Hypertension   . Sleep apnea, obstructive     at some point used CPAP, was d/c  years ago  . Anemia   . History of thrombocytosis   . Allergic rhinitis   . Edema     R>L leg, u/s 5-12 neg for DVT  . Hemorrhoid   . Type II diabetes mellitus   . Sinus congestion   . Migraine     "once/wk at least" (07/11/2013)  . Shortness of breath    Past Surgical History  Procedure Laterality Date  . Toe surgery Right     "tried to straighten out big toe" (07/11/2013)   Family History  Problem Relation Age of Onset  . Colon cancer Neg Hx   . Prostate cancer Neg Hx   . Heart attack Neg Hx   . Diabetes Neg Hx   . Schizophrenia Son    History  Substance Use Topics  . Smoking status: Former Smoker -- 0.25 packs/day for 12 years    Types: Cigarettes    Quit date: 05/18/1966  . Smokeless tobacco: Never Used  . Alcohol Use: No    Review of Systems  All systems reviewed and negative, other than as noted in HPI.   Allergies  Review of patient's allergies indicates no known allergies.  Home Medications   Prior to Admission medications   Medication Sig Start Date End Date Taking? Authorizing Provider   acetaminophen (TYLENOL) 500 MG tablet Take 500-1,000 mg by mouth every 6 (six) hours as needed for moderate pain or headache.     Historical Provider, MD  anagrelide (AGRYLIN) 0.5 MG capsule Take 1 capsule (0.5 mg total) by mouth daily. 02/13/14   Colon Branch, MD  b complex vitamins capsule Take 1 capsule by mouth daily. 01/12/13   Irene Pap, NP  bacitracin 500 UNIT/GM ointment Apply 1 application topically daily as needed for wound care. 01/27/14   Shanker Kristeen Mans, MD  Ferrous Sulfate Dried 200 (65 FE) MG TABS Take 1 tablet by mouth daily.     Historical Provider, MD  fluticasone (FLONASE) 50 MCG/ACT nasal spray Place 2 sprays into both nostrils daily.     Historical Provider, MD  folic acid (FOLVITE) 1 MG tablet Take 1 mg by mouth daily.     Historical Provider, MD  furosemide (LASIX) 40 MG tablet TAKE 3 TABLETS BY MOUTH DAILY 03/19/14   Colon Branch, MD  hydrocerin (EUCERIN) CREA Apply Eucerin cream to BLE Q day after bathing and roughly towel drying to remove loose skin 01/27/14   Jonetta Osgood, MD  HYDROcodone-acetaminophen (NORCO/VICODIN) 5-325 MG per tablet Take 1 tablet by mouth every 6 (six)  hours as needed for moderate pain. 01/27/14   Shanker Kristeen Mans, MD  lisinopril (PRINIVIL,ZESTRIL) 20 MG tablet Take 20 mg by mouth daily.    Historical Provider, MD  potassium chloride SA (K-DUR,KLOR-CON) 20 MEQ tablet Take 20 mEq by mouth daily.    Historical Provider, MD   There were no vitals taken for this visit. Physical Exam  Constitutional: He appears well-developed and well-nourished. No distress.  HENT:  Head: Normocephalic and atraumatic.  Eyes: Conjunctivae are normal. Right eye exhibits no discharge. Left eye exhibits no discharge.  Neck: Neck supple.  Cardiovascular: Regular rhythm and normal heart sounds.  Exam reveals no gallop and no friction rub.   No murmur heard. tachycardic  Pulmonary/Chest: Effort normal and breath sounds normal. No respiratory distress.  Abdominal:  Soft. He exhibits no distension. There is no tenderness.  Musculoskeletal: He exhibits edema and tenderness.  Chronic appearing skin changes to b/l LE consistent with stasis dermatitis. Severe edema, R>L. RLE with increased redness from foot to almost the knee. Increased warmth. No focal area concerning for abscess. Very TTP. Sensation intact to light touch. Unable to palpate DP pulse but feet appear well perfused. Brisk cap refill in toes.   Neurological: He is alert.  Skin: Skin is warm and dry.  Psychiatric: He has a normal mood and affect. His behavior is normal. Thought content normal.  Nursing note and vitals reviewed.   ED Course  Procedures (including critical care time) Labs Review Labs Reviewed  CBC WITH DIFFERENTIAL - Abnormal; Notable for the following:    RBC 3.98 (*)    Hemoglobin 10.3 (*)    HCT 31.9 (*)    MCH 25.9 (*)    RDW 19.9 (*)    All other components within normal limits  BASIC METABOLIC PANEL - Abnormal; Notable for the following:    Glucose, Bld 118 (*)    BUN 34 (*)    GFR calc non Af Amer 52 (*)    GFR calc Af Amer 61 (*)    Anion gap 18 (*)    All other components within normal limits  LACTIC ACID, PLASMA    Imaging Review No results found.   EKG Interpretation None      MDM   Final diagnoses:  Cellulitis of right lower leg  Venous stasis dermatitis of both lower extremities    73yM with pain/swelling in RLE. Chronic appearing changes consistent with stasis dermatitis but very likely superimposed cellulitis RLE. Per review of records, has R>L swelling at baseline. RLE with increased redness as compared to L and very TTP. Erythema extending to just below knee. DVT consideration, but I feel less likely. Tachycardic. Afebrile. Will check rectal temp. IV access. Basic labs. Will start abx. Anticipate admission.   Pt refusing to be rolled for rectal temp because of degree of pain in leg. Repeat temp though was 100.4 giving more support to  infectios etiology.   Pt now with mild hypoxemia. On further questions does now endorse occasional cough recently. CXR, additional studies ordered.     Virgel Manifold, MD 04/05/14 [phone]

## 2014-03-29 NOTE — ED Notes (Signed)
Blood cultures not to be drawn per Dr.Kohut

## 2014-03-29 NOTE — ED Notes (Signed)
Pt given coca cola by Dr.Kohut

## 2014-03-29 NOTE — ED Notes (Signed)
Pt given graham crackers, ginger ale, and water per request.

## 2014-03-29 NOTE — H&P (Signed)
[facility] History and Physical  Juan [name] GOT:[id number] DOB: [birth year] DOA: 03/29/2014  Referring physician: er PCP: Juan November, MD   Chief Complaint: right leg pain  HPI: Juan [name] is a 73 y.o. male  Who presents from home, he lives with his son. Patient has received 8 mg of IV morphine and is fairly lethargic. He is able to be aroused but unable to provide any meaningful history. History obtained from ER chart. The patient has chronic lower extremity edema right greater than left.for the last 1 months patient's and having increasing swelling and pain in his right foot and leg. Pain is so terrible that patient has been unable to bear weight. Patient does admit to chills but denies fever. In the ER, patient was noticed to be Desatting while sleeping into the 80s.the patient was found to have an increased lactic acid as well as an increased white blood cell count. His right leg was warm swollen and tender appearing as a cellulitis.of note patient had a lower sugar many ultrasound done in September 2015 negative for DVTs. There was noted to be a lot of swelling on the ultrasound. Upon further chart review. As it appears the patient was also admitted in September 2015 for a questionable right lower extremity cellulitis. The cellulitis improved on IV antibiotics. Patient is currently febrile.per ER records appears he vomited 1.  Review of Systems:  Unable to do ROS as patient is sleepy and unable to interact for a meaningful period of time    Past Medical History  Diagnosis Date  . CAD (coronary artery disease)     dx elsewheer in past, no documentation. Non-ischemic myovue 2007  . CHF (congestive heart failure)     dx elsewhere, no documentation. Normal EF by previous echo. Trival AS needs f/u ECHO by 2012  . Hypertension   . Sleep apnea, obstructive     at some point used CPAP, was d/c  years ago  . Anemia   . History of thrombocytosis   . Allergic rhinitis   . Edema     R>L  leg, u/s 5-12 neg for DVT  . Hemorrhoid   . Type II diabetes mellitus   . Sinus congestion   . Migraine     "once/wk at least" (07/11/2013)  . Shortness of breath    Past Surgical History  Procedure Laterality Date  . Toe surgery Right     "tried to straighten out big toe" (07/11/2013)   Social History:  reports that he quit smoking about 47 years ago. His smoking use included Cigarettes. He has a 3 pack-year smoking history. He has never used smokeless tobacco. He reports that he does not drink alcohol or use illicit drugs.  No Known Allergies  Family History  Problem Relation Age of Onset  . Colon cancer Neg Hx   . Prostate cancer Neg Hx   . Heart attack Neg Hx   . Diabetes Neg Hx   . Schizophrenia Son      Prior to Admission medications   Medication Sig Start Date End Date Taking? Authorizing Provider  acetaminophen (TYLENOL) 500 MG tablet Take 500-1,000 mg by mouth every 6 (six) hours as needed for moderate pain or headache.     Historical Provider, MD  anagrelide (AGRYLIN) 0.5 MG capsule Take 1 capsule (0.5 mg total) by mouth daily. 02/13/14   Colon Branch, MD  b complex vitamins capsule Take 1 capsule by mouth daily. 01/12/13   Irene Pap, NP  bacitracin 500 UNIT/GM ointment Apply 1 application topically daily as needed for wound care. 01/27/14   Shanker Kristeen Mans, MD  Ferrous Sulfate Dried 200 (65 FE) MG TABS Take 1 tablet by mouth daily.     Historical Provider, MD  fluticasone (FLONASE) 50 MCG/ACT nasal spray Place 2 sprays into both nostrils daily.     Historical Provider, MD  folic acid (FOLVITE) 1 MG tablet Take 1 mg by mouth daily.     Historical Provider, MD  furosemide (LASIX) 40 MG tablet TAKE 3 TABLETS BY MOUTH DAILY 03/19/14   Colon Branch, MD  hydrocerin (EUCERIN) CREA Apply Eucerin cream to BLE Q day after bathing and roughly towel drying to remove loose skin 01/27/14   Jonetta Osgood, MD  HYDROcodone-acetaminophen (NORCO/VICODIN) 5-325 MG per tablet Take 1 tablet  by mouth every 6 (six) hours as needed for moderate pain. 01/27/14   Shanker Kristeen Mans, MD  lisinopril (PRINIVIL,ZESTRIL) 20 MG tablet Take 20 mg by mouth daily.    Historical Provider, MD  potassium chloride SA (K-DUR,KLOR-CON) 20 MEQ tablet Take 20 mEq by mouth daily.    Historical Provider, MD   Physical Exam: Filed Vitals:   03/29/14 1528 03/29/14 1619 03/29/14 1630 03/29/14 1637  BP: 175/76  147/76   Pulse: 121  124   Temp: 98.6 F (37 C) 100.4 F (38 C)    TempSrc: Oral Tympanic    Resp: 18  20   SpO2: 94%  89% 98%    Wt Readings from Last 3 Encounters:  02/22/14 118.049 kg (260 lb 4 oz)  01/27/14 111.041 kg (244 lb 12.8 oz)  12/28/13 116.574 kg (257 lb)    General:  Appears calm and comfortable Eyes: PERRL, normal lids, irises & conjunctiva ENT: grossly normal hearing, lips & tongue Neck: no LAD, masses or thyromegaly Cardiovascular: RRR, no m/r/g. No LE edema. Telemetry: SR, no arrhythmias  Respiratory: CTA bilaterally, no w/r/r. Normal respiratory effort. Abdomen: soft, ntnd Skin: no rash or induration seen on limited exam Musculoskeletal: grossly normal tone BUE/BLE Psychiatric: grossly normal mood and affect, speech fluent and appropriate Neurologic: grossly non-focal.          Labs on Admission:  Basic Metabolic Panel:  Recent Labs Lab 03/29/14 1549  NA 139  K 3.8  CL 101  CO2 20  GLUCOSE 118*  BUN 34*  CREATININE 1.32  CALCIUM 10.1   Liver Function Tests: No results for input(s): AST, ALT, ALKPHOS, BILITOT, PROT, ALBUMIN in the last 168 hours. No results for input(s): LIPASE, AMYLASE in the last 168 hours. No results for input(s): AMMONIA in the last 168 hours. CBC:  Recent Labs Lab 03/29/14 1549  WBC 17.8*  NEUTROABS 9.8*  HGB 10.3*  HCT 31.9*  MCV 80.2  PLT 292   Cardiac Enzymes:  Recent Labs Lab 03/29/14 1549  TROPONINI <0.30    BNP (last 3 results)  Recent Labs  12/09/13 0751 01/25/14 1115 03/29/14 1549  PROBNP  5630.0* 828.2* 6628.0*   CBG: No results for input(s): GLUCAP in the last 168 hours.  Radiological Exams on Admission: No results found.  EKG: Independently reviewed. Sinus tachy  Assessment/Plan Active Problems:   Obstructive sleep apnea   Cellulitis   Leukocytosis   Sepsis   Morbid obesity due to excess calories   Sepsis due to cellultitis- BC x 2, vancomycin Recent duplex  Tachycardia -? Related to infection -if continues to have hypoxia and de-sat, may need CTA/V/Q scan  Hypoxia -appears to  desaturate while sleeping, PRN Bipap, chest x ray, BNP pending -appears to have a history of OSA but no longer wears bipap  Morbid obesity   Will change to SDU bed as patient is hard to keep awake and continues to desaturate after receiving 8 mg of morphine- not sure if changes due to morphine or worsening of his spesis  Code Status: full DVT Prophylaxis: Family Communication: patient Disposition Plan: SDU  Time spent: 75 min  Eulogio Bear [facility] Pager [phone]

## 2014-03-29 NOTE — ED Notes (Signed)
Pt from home c/o bilateral edema and redness x 1 week; pain worse in R leg. Redness in R leg extending to knee. Pt unable to ambulate due to pain; at baseline, pt reports being able to ambulate.

## 2014-03-29 NOTE — ED Notes (Signed)
Unsuccessful IV attempt by 2 RNs; IV teamed consulted

## 2014-03-30 ENCOUNTER — Inpatient Hospital Stay (HOSPITAL_COMMUNITY): Payer: Medicare Other

## 2014-03-30 LAB — BASIC METABOLIC PANEL
ANION GAP: 14 (ref 5–15)
BUN: 34 mg/dL — AB (ref 6–23)
CHLORIDE: 106 meq/L (ref 96–112)
CO2: 21 meq/L (ref 19–32)
CREATININE: 1.44 mg/dL — AB (ref 0.50–1.35)
Calcium: 9.1 mg/dL (ref 8.4–10.5)
GFR calc Af Amer: 55 mL/min — ABNORMAL LOW (ref >90)
GFR calc non Af Amer: 47 mL/min — ABNORMAL LOW (ref >90)
Glucose, Bld: 109 mg/dL — ABNORMAL HIGH (ref 70–99)
Potassium: 4.6 mEq/L (ref 3.7–5.3)
Sodium: 141 mEq/L (ref 137–147)

## 2014-03-30 LAB — CBC
HCT: 35.7 % — ABNORMAL LOW (ref 39.0–52.0)
HEMOGLOBIN: 11.1 g/dL — AB (ref 13.0–17.0)
MCH: 25.3 pg — ABNORMAL LOW (ref 26.0–34.0)
MCHC: 31.1 g/dL (ref 30.0–36.0)
MCV: 81.5 fL (ref 78.0–100.0)
Platelets: ADEQUATE 10*3/uL (ref 150–400)
RBC: 4.38 MIL/uL (ref 4.22–5.81)
RDW: 20.2 % — ABNORMAL HIGH (ref 11.5–15.5)
WBC: 12.8 10*3/uL — ABNORMAL HIGH (ref 4.0–10.5)

## 2014-03-30 MED ORDER — ANAGRELIDE HCL 0.5 MG PO CAPS
0.5000 mg | ORAL_CAPSULE | Freq: Every day | ORAL | Status: DC
  Administered 2014-03-30 – 2014-03-31 (×2): 0.5 mg via ORAL
  Filled 2014-03-30 (×3): qty 1

## 2014-03-30 MED ORDER — ALUM & MAG HYDROXIDE-SIMETH 200-200-20 MG/5ML PO SUSP
15.0000 mL | Freq: Four times a day (QID) | ORAL | Status: DC | PRN
  Administered 2014-03-30 – 2014-04-06 (×6): 15 mL via ORAL
  Filled 2014-03-30 (×7): qty 30

## 2014-03-30 MED ORDER — PIPERACILLIN-TAZOBACTAM 3.375 G IVPB
3.3750 g | Freq: Three times a day (TID) | INTRAVENOUS | Status: DC
  Administered 2014-03-30 – 2014-04-02 (×9): 3.375 g via INTRAVENOUS
  Filled 2014-03-30 (×11): qty 50

## 2014-03-30 MED ORDER — PRO-STAT SUGAR FREE PO LIQD
30.0000 mL | Freq: Two times a day (BID) | ORAL | Status: DC
  Administered 2014-03-30 – 2014-04-10 (×17): 30 mL via ORAL
  Filled 2014-03-30 (×24): qty 30

## 2014-03-30 MED ORDER — PNEUMOCOCCAL VAC POLYVALENT 25 MCG/0.5ML IJ INJ
0.5000 mL | INJECTION | INTRAMUSCULAR | Status: AC
  Administered 2014-03-31: 0.5 mL via INTRAMUSCULAR
  Filled 2014-03-30: qty 0.5

## 2014-03-30 MED ORDER — HYDROCERIN EX CREA
TOPICAL_CREAM | Freq: Every day | CUTANEOUS | Status: DC
  Administered 2014-03-30 – 2014-04-08 (×11): via TOPICAL
  Administered 2014-04-09: 1 via TOPICAL
  Administered 2014-04-10: 10:00:00 via TOPICAL
  Filled 2014-03-30 (×3): qty 113

## 2014-03-30 MED ORDER — GLUCERNA SHAKE PO LIQD
237.0000 mL | Freq: Two times a day (BID) | ORAL | Status: DC
  Administered 2014-03-30 – 2014-04-10 (×17): 237 mL via ORAL

## 2014-03-30 MED ORDER — SODIUM CHLORIDE 0.9 % IV SOLN: INTRAVENOUS | Status: DC

## 2014-03-30 MED ORDER — METOPROLOL TARTRATE 1 MG/ML IV SOLN
2.5000 mg | Freq: Four times a day (QID) | INTRAVENOUS | Status: DC | PRN
  Administered 2014-03-30 – 2014-04-03 (×7): 2.5 mg via INTRAVENOUS
  Filled 2014-03-30 (×8): qty 5

## 2014-03-30 NOTE — Progress Notes (Addendum)
INITIAL NUTRITION ASSESSMENT  DOCUMENTATION CODES Per approved criteria  -Non-severe (moderate) malnutrition in the context of chronic illness   Pt meets criteria for moderate MALNUTRITION in the context of chronic illness as evidenced by <75% estimated nutritional intake x 1 month, mild fat and muscle depeltion.  INTERVENTION: -Glucerna Shake po BID, each supplement provides 220 kcal and 10 grams of protein -30 ml Prostat BID, each supplement provides 100 kcals and 15 grams protein   NUTRITION DIAGNOSIS: Increased nutrient needs related to wound healing as evidenced by stage II pressure ulcer on sacrum, estimatede nutritional needs.   Goal: Pt will meet >90% of estimated nutritional needs  Monitor:  PO intake, labs, weight changes, I/O's  Reason for Assessment: MST=2  73 y.o. male  Admitting Dx: <principal problem not specified>  Juan [name] is a 73 y.o. male who presents from home, he lives with his son. Patient has received 8 mg of IV morphine and is fairly lethargic. He is able to be aroused but unable to provide any meaningful history. History obtained from ER chart. The patient has chronic lower extremity edema right greater than left.for the last 1 months patient's and having increasing swelling and pain in his right foot and leg. Pain is so terrible that patient has been unable to bear weight.  ASSESSMENT: Pt confirms poor appetite and weight loss for approximately one month.  He reveals that everything "tastes funny". He follows a soft diet at home- diet recalls breakfast of cereal and fruits, lunch and dinner is either baked chicken OR tomato soup with fruit. He reports he ate 100% of his breakfast, but that consisted of rice krispies and a bowl of grapes. He reveals UBW of 275-285#. He confirms weight fluctuations due to use of diuretics. However, this is inconsistent with weight hx (weight has ranged between 260-275# within the past year).  Educated pt on importance of  good PO intake, particularly, protein containing foods, to support wound healing. Pt agreeable to try supplements- would like to use both Glucerna and Prostat to increased variety.  Labs received. BUN/Creat: 34/1.44. Glucose: 144.   Nutrition Focused Physical Exam:  Subcutaneous Fat:  Orbital Region: mild depletion Upper Arm Region: mild depletion Thoracic and Lumbar Region: WDL  Muscle:  Temple Region: WDL Clavicle Bone Region: mild depletion Clavicle and Acromion Bone Region: mild depletion Scapular Bone Region: mild depletion Dorsal Hand: WDL Patellar Region: WDL Anterior Thigh Region: WDL Posterior Calf Region: WDL  Edema: bilateral edema on legs  Height: Ht Readings from Last 1 Encounters:  03/29/14 6\' 2"  (1.88 m)    Weight: Wt Readings from Last 1 Encounters:  03/29/14 261 lb 3.9 oz (118.5 kg)    Ideal Body Weight: 190#  % Ideal Body Weight: 137%  Wt Readings from Last 50 Encounters:  03/29/14 261 lb 3.9 oz (118.5 kg)  02/22/14 260 lb 4 oz (118.049 kg)  01/27/14 244 lb 12.8 oz (111.041 kg)  12/28/13 257 lb (116.574 kg)  12/09/13 276 lb (125.193 kg)  11/16/13 270 lb 1.6 oz (122.517 kg)  10/19/13 271 lb (122.925 kg)  10/17/13 267 lb 3.2 oz (121.201 kg)  10/03/13 270 lb (122.471 kg)  09/08/13 270 lb (122.471 kg)  08/16/13 278 lb (126.1 kg)  07/24/13 276 lb (125.193 kg)  07/12/13 275 lb 9.2 oz (125 kg)  06/15/13 269 lb (122.018 kg)  04/28/13 263 lb (119.296 kg)  04/12/13 257 lb (116.574 kg)  01/12/13 257 lb (116.574 kg)  12/23/12 258 lb 12.8 oz (117.391 kg)  12/01/12 286 lb (129.729 kg)  12/01/12 269 lb 6 oz (122.188 kg)  09/08/12 247 lb (112.038 kg)  07/15/12 232 lb 6.4 oz (105.416 kg)  07/07/12 228 lb (103.42 kg)  05/05/12 221 lb (100.245 kg)  03/16/12 227 lb (102.967 kg)  01/14/12 221 lb (100.245 kg)  09/10/11 255 lb (115.667 kg)  06/09/11 271 lb (122.925 kg)  05/07/11 268 lb (121.564 kg)  01/08/11 259 lb 8 oz (117.708 kg)  09/11/10 268 lb 3.2  oz (121.655 kg)  05/08/10 281 lb (127.461 kg)  05/01/10 280 lb (127.007 kg)  01/02/10 280 lb 4 oz (127.121 kg)  08/29/09 273 lb (123.832 kg)  04/30/09 282 lb (127.914 kg)  04/25/09 282 lb (127.914 kg)  01/03/09 275 lb 3.2 oz (124.83 kg)  09/27/08 277 lb 9.6 oz (125.919 kg)  05/31/08 281 lb 9.6 oz (127.733 kg)  02/02/08 297 lb 3.2 oz (134.809 kg)  10/27/07 301 lb 3.2 oz (136.623 kg)  07/21/07 304 lb 9.6 oz (138.166 kg)  05/05/07 304 lb (137.893 kg)  04/19/07 306 lb 6.4 oz (138.982 kg)  01/20/07 304 lb 6.4 oz (138.075 kg)  10/21/06 298 lb 3.2 oz (135.263 kg)   Usual Body Weight: 270#  % Usual Body Weight: 97%  BMI:  Body mass index is 33.53 kg/(m^2). Obesity, class I  Estimated Nutritional Needs: Kcal: 2300-2500 Protein: 112-124 grams Fluid: 2.3-2.5 L  Skin: Stage II pressure ulcer with associated MASD (moisture associated skin damage), bilateral LE edema  Diet Order: Diet Carb Modified  EDUCATION NEEDS: -Education needs addressed   Intake/Output Summary (Last 24 hours) at 03/30/14 1028 Last data filed at 03/30/14 0842  Gross per 24 hour  Intake    543 ml  Output    125 ml  Net    418 ml    Last BM: 03/29/14  Labs:   Recent Labs Lab 03/29/14 1549 03/30/14 0305  NA 139 141  K 3.8 4.6  CL 101 106  CO2 20 21  BUN 34* 34*  CREATININE 1.32 1.44*  CALCIUM 10.1 9.1  GLUCOSE 118* 109*    CBG (last 3)  No results for input(s): GLUCAP in the last 72 hours.  Scheduled Meds: . heparin  5,000 Units Subcutaneous 3 times per day  . hydrocerin   Topical Daily  . piperacillin-tazobactam (ZOSYN)  IV  3.375 g Intravenous Q8H  . [START ON 03/31/2014] pneumococcal 23 valent vaccine  0.5 mL Intramuscular Tomorrow-1000  . sodium chloride  3 mL Intravenous Q12H  . vancomycin  1,000 mg Intravenous Q12H    Continuous Infusions: . sodium chloride      Past Medical History  Diagnosis Date  . CAD (coronary artery disease)     dx elsewheer in past, no documentation.  Non-ischemic myovue 2007  . CHF (congestive heart failure)     dx elsewhere, no documentation. Normal EF by previous echo. Trival AS needs f/u ECHO by 2012  . Hypertension   . Sleep apnea, obstructive     at some point used CPAP, was d/c  years ago  . Anemia   . History of thrombocytosis   . Allergic rhinitis   . Edema     R>L leg, u/s 5-12 neg for DVT  . Hemorrhoid   . Type II diabetes mellitus   . Sinus congestion   . Migraine     "once/wk at least" (07/11/2013)  . Shortness of breath     Past Surgical History  Procedure Laterality Date  . Toe surgery  Right     "tried to straighten out big toe" (07/11/2013)    Juan [name] A. Jimmye Norman, RD, LDN Pager: [phone] After hours Pager: [phone]

## 2014-03-30 NOTE — Plan of Care (Addendum)
Problem: Consults Goal: Skin Care Protocol Initiated - if Braden Score 18 or less If consults are not indicated, leave blank or document N/A Outcome: Progressing  Comments:  Sacral foam patches placed.

## 2014-03-30 NOTE — Care Management Note (Addendum)
    Page 1 of 1   04/05/2014     9:52:24 AM CARE MANAGEMENT NOTE 04/05/2014  Patient:  Juan [name], Juan [name]   Account Number:  [id number]  Date Initiated:  03/30/2014  Documentation initiated by:  Elissa Hefty  Subjective/Objective Assessment:   adm w sepsis     Action/Plan:   lives w fam, pcp dr Jacqulyn Bath paz   Anticipated DC Date:  04/06/2014   Anticipated DC Plan:  [place]         Choice offered to / List presented to:             Status of service:   Medicare Important Message given?  YES (If response is "NO", the following Medicare IM given date fields will be blank) Date Medicare IM given:  04/02/2014 Medicare IM given by:  Elissa Hefty Date Additional Medicare IM given:  04/05/2014 Additional Medicare IM given by:  [facility] Porshe Fleagle  Discharge Disposition:    Per UR Regulation:  Reviewed for med. necessity/level of care/duration of stay  If discussed at [place] of Stay Meetings, dates discussed:   04/03/2014  04/05/2014    Comments:

## 2014-03-30 NOTE — Progress Notes (Signed)
Pt came down for LE MRI, pt was in scanner for about 10 minutes and pt started yelling wanting to get out of scanner said his back hurt too bad and that he was partially paralyzed and had to get out of scanner.  I contacted Gabe RN on the floor and he was trying to get an order for more meds.  While waiting pt started yelling he wanted out of the scanner immediately, that he would just have to try again some other time and refused any attempt at further imaging.  Pt sent back up to floor.

## 2014-03-30 NOTE — Progress Notes (Addendum)
[facility] PROGRESS NOTE  Juan [name] CXK:[id number] DOB: 1941/04/18 DOA: 03/29/2014 PCP: [name], MD  Assessment/Plan: Active Problems:   Obstructive sleep apnea   Cellulitis   Leukocytosis   Sepsis   Morbid obesity due to excess calories    Bilateral lower extremity cellulitis/sepsis Follow blood culture Continue with vancomycin, Zosyn MRI of the right foot and the right lower extremity to rule out abscess Appreciate wound care consultation. 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   History of CAD  - Reviewed outpatient cardiology notes-had a normal Myoview in 2007.  - Stable without any chest pain or shortness of breath  We'll cycle cardiac enzymes in the setting of sepsis  Chronic lower extremity edema, stasis dermatitis  - Chronic issue-as noted above-current exam consistent with chronic stasis dermatitis changes.  Resume Lasix when able   Code Status: full Family Communication: family updated about patient's clinical progress Disposition Plan:  As above    Brief narrative: Juan [name] is a 73 y.o. male  Who presents from home, he lives with his son. Patient has received 8 mg of IV morphine and is fairly lethargic. He is able to be aroused but unable to provide any meaningful history. History obtained from ER chart. The patient has chronic lower extremity edema  right greater than left.for the last 1 months patient's and having increasing swelling and pain in his right foot and leg. Pain is so terrible that patient has been unable to bear weight. Patient does admit to chills but denies fever. In the ER, patient was noticed to be Desatting while sleeping into the 80s.the patient was found to have an increased lactic acid as well as an increased white blood cell count. His right leg was warm swollen and tender appearing as a cellulitis.of note patient had a lower sugar many ultrasound done in September 2015 negative for DVTs. There was noted to be a lot of swelling on the ultrasound. Upon further chart review. As it appears the patient was also admitted in September 2015 for a questionable right lower extremity cellulitis. The cellulitis improved on IV antibiotics. Patient is currently febrile.per ER records appears he vomited 1.   Consultants:  none  Procedures:  none  Antibiotics:  Vancomycin/Zosyn  HPI/Subjective: Patient tachycardic, complaining of bilateral feet pain and bilateral leg pain  Objective: Filed Vitals:   03/30/14 0300 03/30/14 0400 03/30/14 0725 03/30/14 0900  BP: 134/64 121/66 134/72   Pulse: 116 111 110   Temp: 98.5 F (36.9 C)  102.8 F (39.3 C) 98.3 F (36.8 C)  TempSrc: Oral  Oral Oral  Resp: 21 17 18    Height:      Weight:      SpO2: 100% 100% 100%     Intake/Output Summary (Last 24 hours) at 03/30/14 1029 Last data filed at 03/30/14 0842  Gross per 24 hour  Intake    543 ml  Output    125 ml  Net  418 ml    Exam:  General: alert & oriented x 3 In NAD  Cardiovascular: RRR, nl S1 s2  Respiratory: Decreased breath sounds at the bases, scattered rhonchi, no crackles  Abdomen: soft +BS NT/ND, no masses palpable  Extremities: bilateral lower extremity edema and erythema, right greater than left      Data Reviewed: Basic Metabolic Panel:  Recent Labs Lab 03/29/14 1549 03/30/14 0305  NA 139  141  K 3.8 4.6  CL 101 106  CO2 20 21  GLUCOSE 118* 109*  BUN 34* 34*  CREATININE 1.32 1.44*  CALCIUM 10.1 9.1    Liver Function Tests: No results for input(s): AST, ALT, ALKPHOS, BILITOT, PROT, ALBUMIN in the last 168 hours. No results for input(s): LIPASE, AMYLASE in the last 168 hours. No results for input(s): AMMONIA in the last 168 hours.  CBC:  Recent Labs Lab 03/29/14 1549 03/30/14 0305  WBC 17.8* 12.8*  NEUTROABS 9.8*  --   HGB 10.3* 11.1*  HCT 31.9* 35.7*  MCV 80.2 81.5  PLT 292 PLATELET CLUMPS NOTED ON SMEAR, COUNT APPEARS ADEQUATE    Cardiac Enzymes:  Recent Labs Lab 03/29/14 1549  TROPONINI <0.30   BNP (last 3 results)  Recent Labs  12/09/13 0751 01/25/14 1115 03/29/14 1549  PROBNP 5630.0* 828.2* 6628.0*     CBG: No results for input(s): GLUCAP in the last 168 hours.  Recent Results (from the past 240 hour(s))  MRSA PCR Screening     Status: None   Collection Time: 03/29/14  9:25 PM  Result Value Ref Range Status   MRSA by PCR NEGATIVE NEGATIVE Final    Comment:        The GeneXpert MRSA Assay (FDA approved for NASAL specimens only), is one component of a comprehensive MRSA colonization surveillance program. It is not intended to diagnose MRSA infection nor to guide or monitor treatment for MRSA infections.      Studies: Dg Chest Portable 1 View  03/29/2014   CLINICAL DATA:  Cellulitis. Cough and congestion. Shortness of breath. Chest pain. Nausea, vomiting, fever. History of hypertension and smoking.  EXAM: PORTABLE CHEST - 1 VIEW  COMPARISON:  01/26/2014  FINDINGS: Shallow inflation. Heart is enlarged. Mild pulmonary edema. There are no focal consolidations. Although not well evaluated on this portable view of the chest, the bones have a somewhat mottled lucent appearance raising the question of metabolic or metastatic disorder.  IMPRESSION: 1. Cardiomegaly and pulmonary edema. 2. Question of diffuse osseous changes. Recommend  correlation with history and lab values. Consider further imaging if needed.   Electronically Signed   By: Shon Hale M.D.   On: 03/29/2014 18:01    Scheduled Meds: . heparin  5,000 Units Subcutaneous 3 times per day  . hydrocerin   Topical Daily  . piperacillin-tazobactam (ZOSYN)  IV  3.375 g Intravenous Q8H  . [START ON 03/31/2014] pneumococcal 23 valent vaccine  0.5 mL Intramuscular Tomorrow-1000  . sodium chloride  3 mL Intravenous Q12H  . vancomycin  1,000 mg Intravenous Q12H   Continuous Infusions: . sodium chloride      Active Problems:   Obstructive sleep apnea   Cellulitis   Leukocytosis   Sepsis   Morbid obesity due to excess calories    Time spent: 40 minutes   [facility] Pager [phone]. If 7PM-7AM, please contact night-coverage at [email or web address], password [facility] 03/30/2014, 10:29 AM  LOS: 1 day

## 2014-03-30 NOTE — Consult Note (Signed)
WOC wound consult note Reason for Consult:  Sacrum, however at the time of my assessment I see only one open area on the right buttock that feel may be related to a combination of moisture and pressure.  He has been non ambulatory at home for several days and while he reports using the urinal at home he is currently wet with urine.  He is also noted to have bilateral LE edema with cellulitis, I was not consulted for this reason, but the nurse has asked me to evaluate the legs this am.  The patient reports he has chronic edema, he does have palpable pulses.  It does appear he has chronic venous stasis skin changes with hemosiderin staining and venous dermatitis, however currently he would not be a candidate for compression due to the extent of the cellulitis he has. Wound type: Stage II pressure ulcer with associated MASD (moisture associated skin damage) Pressure Ulcer POA: Yes Measurement: 3cm x 1.0cm x 0.2cm  Wound OAC:ZYSAY, pink, moist Drainage (amount, consistency, odor) unable to assess, wet with urine Periwound: intact but with some mild erythema   Dressing procedure/placement/frequency: Silicone foam to protect and insulate the ulcer, however if this dressing is continuously wet with urine will need to use barrier cream only.  Elevate LE as much as possible to lessen edema.  Eucerin cream to bilateral LE for venous dermatitis.  Discussed POC with patient and bedside nurse.  Re consult if needed, will not follow at this time. Thanks  Shaira Sova [place], [place] [phone])

## 2014-03-31 ENCOUNTER — Inpatient Hospital Stay (HOSPITAL_COMMUNITY): Payer: Medicare Other

## 2014-03-31 DIAGNOSIS — A419 Sepsis, unspecified organism: Secondary | ICD-10-CM | POA: Diagnosis not present

## 2014-03-31 LAB — COMPREHENSIVE METABOLIC PANEL
ALT: 12 U/L (ref 0–53)
ANION GAP: 14 (ref 5–15)
AST: 18 U/L (ref 0–37)
Albumin: 2.2 g/dL — ABNORMAL LOW (ref 3.5–5.2)
Alkaline Phosphatase: 101 U/L (ref 39–117)
BUN: 29 mg/dL — ABNORMAL HIGH (ref 6–23)
CALCIUM: 8.9 mg/dL (ref 8.4–10.5)
CO2: 21 mEq/L (ref 19–32)
Chloride: 103 mEq/L (ref 96–112)
Creatinine, Ser: 1.64 mg/dL — ABNORMAL HIGH (ref 0.50–1.35)
GFR, EST AFRICAN AMERICAN: 47 mL/min — AB (ref >90)
GFR, EST NON AFRICAN AMERICAN: 40 mL/min — AB (ref >90)
GLUCOSE: 112 mg/dL — AB (ref 70–99)
Potassium: 4.2 mEq/L (ref 3.7–5.3)
Sodium: 138 mEq/L (ref 137–147)
Total Bilirubin: 0.5 mg/dL (ref 0.3–1.2)
Total Protein: 5.8 g/dL — ABNORMAL LOW (ref 6.0–8.3)

## 2014-03-31 LAB — CBC
HCT: 27.3 % — ABNORMAL LOW (ref 39.0–52.0)
Hemoglobin: 8.5 g/dL — ABNORMAL LOW (ref 13.0–17.0)
MCH: 25.1 pg — AB (ref 26.0–34.0)
MCHC: 31.1 g/dL (ref 30.0–36.0)
MCV: 80.5 fL (ref 78.0–100.0)
PLATELETS: ADEQUATE 10*3/uL (ref 150–400)
RBC: 3.39 MIL/uL — ABNORMAL LOW (ref 4.22–5.81)
RDW: 20 % — ABNORMAL HIGH (ref 11.5–15.5)
WBC: 20.8 10*3/uL — ABNORMAL HIGH (ref 4.0–10.5)

## 2014-03-31 LAB — VANCOMYCIN, TROUGH: Vancomycin Tr: 21 ug/mL — ABNORMAL HIGH (ref 10.0–20.0)

## 2014-03-31 MED ORDER — VANCOMYCIN HCL 10 G IV SOLR
1250.0000 mg | INTRAVENOUS | Status: DC
  Administered 2014-04-01: 1250 mg via INTRAVENOUS
  Filled 2014-03-31 (×2): qty 1250

## 2014-03-31 MED ORDER — VANCOMYCIN HCL IN DEXTROSE 1-5 GM/200ML-% IV SOLN
1000.0000 mg | Freq: Two times a day (BID) | INTRAVENOUS | Status: DC
  Administered 2014-03-31 (×2): 1000 mg via INTRAVENOUS
  Filled 2014-03-31 (×3): qty 200

## 2014-03-31 MED ORDER — LORAZEPAM 2 MG/ML IJ SOLN
1.0000 mg | Freq: Once | INTRAMUSCULAR | Status: AC
  Administered 2014-04-01: 1 mg via INTRAVENOUS
  Filled 2014-03-31: qty 1

## 2014-03-31 MED ORDER — TECHNETIUM TO 99M ALBUMIN AGGREGATED
6.0000 | Freq: Once | INTRAVENOUS | Status: AC | PRN
  Administered 2014-03-31: 6 via INTRAVENOUS

## 2014-03-31 MED ORDER — SODIUM CHLORIDE 0.9 % IV SOLN
INTRAVENOUS | Status: AC
  Administered 2014-03-31: 12:00:00 via INTRAVENOUS

## 2014-03-31 MED ORDER — METOPROLOL TARTRATE 1 MG/ML IV SOLN
2.5000 mg | Freq: Once | INTRAVENOUS | Status: AC
  Administered 2014-03-31: 2.5 mg via INTRAVENOUS
  Filled 2014-03-31: qty 5

## 2014-03-31 MED ORDER — VANCOMYCIN HCL IN DEXTROSE 1-5 GM/200ML-% IV SOLN: 1000.0000 mg | Freq: Two times a day (BID) | INTRAVENOUS | Status: DC

## 2014-03-31 MED ORDER — TECHNETIUM TC 99M DIETHYLENETRIAME-PENTAACETIC ACID: 40.0000 | Freq: Once | INTRAVENOUS | Status: AC | PRN

## 2014-03-31 NOTE — Progress Notes (Signed)
ANTIBIOTIC CONSULT NOTE - Follow-up  Pharmacy Consult for vancomycin/zosyn Indication: cellulitis  No Known Allergies  Patient Measurements:  Vital Signs: Temp: 100 F (37.8 C) (11/14 1941) Temp Source: Oral (11/14 1941) BP: 120/49 mmHg (11/14 1941) Intake/Output from previous day: 11/13 0701 - 11/14 0700 In: 1131.3 [P.O.:120; I.V.:511.3; IV Piggyback:500] Out: 1325 [Urine:1325] Intake/Output from this shift:    Labs:  Recent Labs  03/29/14 1549 03/30/14 0305 03/31/14 0350  WBC 17.8* 12.8* 20.8*  HGB 10.3* 11.1* 8.5*  PLT 292 PLATELET CLUMPS NOTED ON SMEAR, COUNT APPEARS ADEQUATE PLATELET CLUMPS NOTED ON SMEAR, COUNT APPEARS ADEQUATE  CREATININE 1.32 1.44* 1.64*   Estimated Creatinine Clearance: 55.7 mL/min (by C-G formula based on Cr of 1.64).  Recent Labs  03/31/14 1841  VANCOTROUGH 21.0*     Microbiology: Recent Results (from the past 720 hour(s))  Blood culture (routine x 2)     Status: None (Preliminary result)   Collection Time: 03/29/14  3:48 PM  Result Value Ref Range Status   Specimen Description BLOOD ARM RIGHT  Final   Special Requests BOTTLES DRAWN AEROBIC AND ANAEROBIC 5CC  Final   Culture  Setup Time   Final    03/30/2014 01:30 Performed at [facility]    Culture   Final           BLOOD CULTURE RECEIVED NO GROWTH TO DATE CULTURE WILL BE HELD FOR 5 DAYS BEFORE ISSUING A FINAL NEGATIVE REPORT Performed at [facility]    Report Status PENDING  Incomplete  Blood culture (routine x 2)     Status: None (Preliminary result)   Collection Time: 03/29/14  5:39 PM  Result Value Ref Range Status   Specimen Description BLOOD RIGHT ARM  Final   Special Requests BOTTLES DRAWN AEROBIC AND ANAEROBIC 5CC  Final   Culture  Setup Time   Final    03/30/2014 01:30 Performed at [facility]    Culture   Final           BLOOD CULTURE RECEIVED NO GROWTH TO DATE CULTURE WILL BE HELD FOR 5 DAYS BEFORE ISSUING A FINAL NEGATIVE  REPORT Performed at [facility]    Report Status PENDING  Incomplete  MRSA PCR Screening     Status: None   Collection Time: 03/29/14  9:25 PM  Result Value Ref Range Status   MRSA by PCR NEGATIVE NEGATIVE Final    Comment:        The GeneXpert MRSA Assay (FDA approved for NASAL specimens only), is one component of a comprehensive MRSA colonization surveillance program. It is not intended to diagnose MRSA infection nor to guide or monitor treatment for MRSA infections.     Assessment: 73 yo M on Vancomycin and Zosyn (Day #3) for BL LE cellultis. WBC trend up to 20.8. Continues to be febrile. Tm 102.8. Vancomycin trough 21 mcg/ml (supratherapeutic) on 1gm IV q12h.  SCr continues to trend up.  Goal of Therapy:  Vancomycin trough level 10-15 mcg/ml  Plan:  Change Vancomycin to 1250 mg IV q24hrs Zosyn 3.375 gm IV q8hrs (4-hr infusion) VT at new Css Monitor CBC, temperature curve, renal function, clinical course  Sherlon Handing, PharmD, BCPS Clinical pharmacist, pager [phone] 03/31/2014 8:08 PM

## 2014-03-31 NOTE — Progress Notes (Signed)
Dr Allyson Sabal notified of pt temp 102.3 and tylenol given. No new orders at this time. Susie Cassette RN

## 2014-03-31 NOTE — Progress Notes (Signed)
Notified MD of pressure ulcer and need for wound consult. Notified MD of consistent ST in the 130s unchanged with prn metoprolol. New orders received.

## 2014-03-31 NOTE — Progress Notes (Addendum)
[facility] PROGRESS NOTE  [name] [id number]:[id number] DOB: [birth year] DOA: 03/29/2014 PCP: [name], MD  Assessment/Plan: Active Problems:   Obstructive sleep apnea   Cellulitis   Leukocytosis   Sepsis   Morbid obesity due to excess calories   Malnutrition of moderate degree .   Bilateral lower extremity cellulitis/sepsis Blood culture no growth so far Continue with vancomycin, Zosyn MRI of the right foot and the right lower extremity to rule out abscess pending Appreciate wound care consultation. 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  - Stable without any chest pain or shortness of breath  We'll cycle cardiac enzymes in the setting of sepsis  Chronic lower extremity edema, stasis dermatitis  - Chronic issue-as noted above-current exam consistent with chronic stasis dermatitis changes.  Resume Lasix when able   Code Status: full Family Communication: family updated about patient's clinical progress Disposition Plan:  Transfer to telemetry   Brief  narrative: Juan [name] is a 73 y.o. male  Who presents from home, he lives with his son. Patient has received 8 mg of IV morphine and is fairly lethargic. He is able to be aroused but unable to provide any meaningful history. History obtained from ER chart. The patient has chronic lower extremity edema right greater than left.for the last 1 months patient's and having increasing swelling and pain in his right foot and leg. Pain is so terrible that patient has been unable to bear weight. Patient does admit to chills but denies fever. In the ER, patient was noticed to be Desatting while sleeping into the 80s.the patient was found to have an increased lactic acid as well as an increased white blood cell count. His right leg was warm swollen and tender appearing as a cellulitis.of note patient had a lower sugar many ultrasound done in September 2015 negative for DVTs. There was noted to be a lot of swelling on the ultrasound. Upon further chart review. As it appears the patient was also admitted in September 2015 for a questionable right lower extremity cellulitis. The cellulitis improved on IV antibiotics. Patient is currently febrile.per ER records appears he vomited 1.   Consultants:  none  Procedures:  none  Antibiotics:  Vancomycin/Zosyn  HPI/Subjective: Patient tachycardic, complaining of bilateral feet pain and bilateral leg pain  Objective: Filed Vitals:   03/30/14 2100 03/30/14 2300 03/31/14 0300 03/31/14 0803  BP:  102/65 145/80 145/73  Pulse:  138 134   Temp:  102.1 F (38.9 C) 99.6 F (37.6 C) 98.2 F (36.8 C)  TempSrc:  Oral Oral Oral  Resp: 18 25 18 22   Height:  Weight:      SpO2:  97% 97% 98%    Intake/Output Summary (Last 24 hours) at 03/31/14 1002 Last data filed at 03/31/14 0618  Gross per 24 hour  Intake 961.25 ml  Output   1325 ml  Net -363.75 ml    Exam:  General: alert & oriented x 3 In NAD  Cardiovascular: RRR, nl S1 s2  Respiratory:  Decreased breath sounds at the bases, scattered rhonchi, no crackles  Abdomen: soft +BS NT/ND, no masses palpable  Extremities: bilateral lower extremity edema and erythema, right greater than left      Data Reviewed: Basic Metabolic Panel:  Recent Labs Lab 03/29/14 1549 03/30/14 0305 03/31/14 0350  NA 139 141 138  K 3.8 4.6 4.2  CL 101 106 103  CO2 20 21 21   GLUCOSE 118* 109* 112*  BUN 34* 34* 29*  CREATININE 1.32 1.44* 1.64*  CALCIUM 10.1 9.1 8.9    Liver Function Tests:  Recent Labs Lab 03/31/14 0350  AST 18  ALT 12  ALKPHOS 101  BILITOT 0.5  PROT 5.8*  ALBUMIN 2.2*   No results for input(s): LIPASE, AMYLASE in the last 168 hours. No results for input(s): AMMONIA in the last 168 hours.  CBC:  Recent Labs Lab 03/29/14 1549 03/30/14 0305 03/31/14 0350  WBC 17.8* 12.8* 20.8*  NEUTROABS 9.8*  --   --   HGB 10.3* 11.1* 8.5*  HCT 31.9* 35.7* 27.3*  MCV 80.2 81.5 80.5  PLT 292 PLATELET CLUMPS NOTED ON SMEAR, COUNT APPEARS ADEQUATE PLATELET CLUMPS NOTED ON SMEAR, COUNT APPEARS ADEQUATE    Cardiac Enzymes:  Recent Labs Lab 03/29/14 1549  TROPONINI <0.30   BNP (last 3 results)  Recent Labs  12/09/13 0751 01/25/14 1115 03/29/14 1549  PROBNP 5630.0* 828.2* 6628.0*     CBG: No results for input(s): GLUCAP in the last 168 hours.  Recent Results (from the past 240 hour(s))  MRSA PCR Screening     Status: None   Collection Time: 03/29/14  9:25 PM  Result Value Ref Range Status   MRSA by PCR NEGATIVE NEGATIVE Final    Comment:        The GeneXpert MRSA Assay (FDA approved for NASAL specimens only), is one component of a comprehensive MRSA colonization surveillance program. It is not intended to diagnose MRSA infection nor to guide or monitor treatment for MRSA infections.      Studies: Dg Chest Portable 1 View  03/29/2014   CLINICAL DATA:  Cellulitis. Cough and congestion. Shortness of breath. Chest pain. Nausea, vomiting, fever.  History of hypertension and smoking.  EXAM: PORTABLE CHEST - 1 VIEW  COMPARISON:  01/26/2014  FINDINGS: Shallow inflation. Heart is enlarged. Mild pulmonary edema. There are no focal consolidations. Although not well evaluated on this portable view of the chest, the bones have a somewhat mottled lucent appearance raising the question of metabolic or metastatic disorder.  IMPRESSION: 1. Cardiomegaly and pulmonary edema. 2. Question of diffuse osseous changes. Recommend correlation with history and lab values. Consider further imaging if needed.   Electronically Signed   By: Shon Hale M.D.   On: 03/29/2014 18:01    Scheduled Meds: . anagrelide  0.5 mg Oral Daily  . feeding supplement (GLUCERNA SHAKE)  237 mL Oral BID BM  . feeding supplement (PRO-STAT SUGAR FREE 64)  30 mL Oral BID BM  . heparin  5,000 Units Subcutaneous 3 times per day  . hydrocerin   Topical Daily  . piperacillin-tazobactam (ZOSYN)  IV  3.375 g Intravenous Q8H  . pneumococcal 23 valent vaccine  0.5 mL Intramuscular Tomorrow-1000  . sodium chloride  3 mL Intravenous Q12H  . vancomycin  1,000 mg Intravenous Q12H   Continuous Infusions: . sodium chloride Stopped (03/31/14 0502)    Active Problems:   Obstructive sleep apnea   Cellulitis   Leukocytosis   Sepsis   Morbid obesity due to excess calories   Malnutrition of moderate degree    Time spent: 40 minutes   [facility] Pager [phone]. If 7PM-7AM, please contact night-coverage at [email or web address], password [email or web address] 03/31/2014, 10:02 AM  LOS: 2 days

## 2014-04-01 ENCOUNTER — Inpatient Hospital Stay (HOSPITAL_COMMUNITY): Payer: Medicare Other

## 2014-04-01 DIAGNOSIS — I517 Cardiomegaly: Secondary | ICD-10-CM

## 2014-04-01 DIAGNOSIS — R609 Edema, unspecified: Secondary | ICD-10-CM

## 2014-04-01 LAB — CBC
HEMATOCRIT: 27.8 % — AB (ref 39.0–52.0)
HEMATOCRIT: 28.9 % — AB (ref 39.0–52.0)
HEMOGLOBIN: 9.1 g/dL — AB (ref 13.0–17.0)
Hemoglobin: 8.9 g/dL — ABNORMAL LOW (ref 13.0–17.0)
MCH: 25.9 pg — ABNORMAL LOW (ref 26.0–34.0)
MCH: 25.5 pg — AB (ref 26.0–34.0)
MCHC: 32 g/dL (ref 30.0–36.0)
MCHC: 31.5 g/dL (ref 30.0–36.0)
MCV: 80.8 fL (ref 78.0–100.0)
MCV: 81 fL (ref 78.0–100.0)
PLATELETS: 196 10*3/uL (ref 150–400)
PLATELETS: 179 10*3/uL (ref 150–400)
RBC: 3.44 MIL/uL — ABNORMAL LOW (ref 4.22–5.81)
RBC: 3.57 MIL/uL — ABNORMAL LOW (ref 4.22–5.81)
RDW: 20 % — AB (ref 11.5–15.5)
RDW: 19.9 % — ABNORMAL HIGH (ref 11.5–15.5)
WBC: 24.4 10*3/uL — ABNORMAL HIGH (ref 4.0–10.5)
WBC: 27.2 10*3/uL — ABNORMAL HIGH (ref 4.0–10.5)

## 2014-04-01 LAB — DIFFERENTIAL
BASOS ABS: 0 10*3/uL (ref 0.0–0.1)
BLASTS: 5 %
Band Neutrophils: 16 % — ABNORMAL HIGH (ref 0–10)
Basophils Relative: 0 % (ref 0–1)
EOS PCT: 0 % (ref 0–5)
Eosinophils Absolute: 0 10*3/uL (ref 0.0–0.7)
LYMPHS ABS: 4.9 10*3/uL — AB (ref 0.7–4.0)
Lymphocytes Relative: 18 % (ref 12–46)
MONOS PCT: 8 % (ref 3–12)
Metamyelocytes Relative: 6 %
Monocytes Absolute: 2.2 10*3/uL — ABNORMAL HIGH (ref 0.1–1.0)
Myelocytes: 15 %
NEUTROS PCT: 22 % — AB (ref 43–77)
Neutro Abs: 18.8 10*3/uL — ABNORMAL HIGH (ref 1.7–7.7)
Promyelocytes Absolute: 10 %
WBC Morphology: INCREASED

## 2014-04-01 LAB — TROPONIN I

## 2014-04-01 LAB — URIC ACID: Uric Acid, Serum: 7.4 mg/dL (ref 4.0–7.8)

## 2014-04-01 LAB — COMPREHENSIVE METABOLIC PANEL
ALBUMIN: 2 g/dL — AB (ref 3.5–5.2)
ALT: 12 U/L (ref 0–53)
ANION GAP: 14 (ref 5–15)
AST: 22 U/L (ref 0–37)
Alkaline Phosphatase: 122 U/L — ABNORMAL HIGH (ref 39–117)
BUN: 30 mg/dL — AB (ref 6–23)
CALCIUM: 9.1 mg/dL (ref 8.4–10.5)
CO2: 21 mEq/L (ref 19–32)
CREATININE: 1.89 mg/dL — AB (ref 0.50–1.35)
Chloride: 104 mEq/L (ref 96–112)
GFR calc Af Amer: 39 mL/min — ABNORMAL LOW (ref >90)
GFR calc non Af Amer: 34 mL/min — ABNORMAL LOW (ref >90)
Glucose, Bld: 131 mg/dL — ABNORMAL HIGH (ref 70–99)
Potassium: 4.3 mEq/L (ref 3.7–5.3)
Sodium: 139 mEq/L (ref 137–147)
TOTAL PROTEIN: 6.1 g/dL (ref 6.0–8.3)
Total Bilirubin: 0.5 mg/dL (ref 0.3–1.2)

## 2014-04-01 LAB — LACTATE DEHYDROGENASE: LDH: 1386 U/L — AB (ref 94–250)

## 2014-04-01 MED ORDER — SODIUM CHLORIDE 0.9 % IV SOLN: INTRAVENOUS | Status: AC

## 2014-04-01 MED ORDER — POLYETHYLENE GLYCOL 3350 17 G PO PACK
17.0000 g | PACK | Freq: Once | ORAL | Status: AC
  Administered 2014-04-01: 17 g via ORAL
  Filled 2014-04-01: qty 1

## 2014-04-01 MED ORDER — PANTOPRAZOLE SODIUM 40 MG PO TBEC
40.0000 mg | DELAYED_RELEASE_TABLET | Freq: Every day | ORAL | Status: DC
  Administered 2014-04-01 – 2014-04-10 (×10): 40 mg via ORAL
  Filled 2014-04-01 (×10): qty 1

## 2014-04-01 NOTE — Progress Notes (Addendum)
[facility] PROGRESS NOTE  Juan [name] [id number]:[id number] DOB: 27-Feb-1941 DOA: 03/29/2014 PCP: [name], MD  Assessment/Plan: Active Problems:   Obstructive sleep apnea   Cellulitis   Leukocytosis   Sepsis   Morbid obesity due to excess calories   Malnutrition of moderate degree .Marland Kitchen   Bilateral lower extremity cellulitis/sepsis/persistent fever Blood culture no growth so far Continue with vancomycin, Zosyn MRI of the right foot and the right lower extremity to rule out abscess results pending Appreciate wound care consultation. Patient has a stage II pressure ulcer on his right buttock Patient currently tachycardic and appears to be septic continue on IV hydration   Fever Concern for other hematologic processes contribution to the fever, AML?concern for blast crisis Hematology consulted WBC count increasing   Sinus tachycardia Was likely secondary to sepsis Patient has a history of myeloproliferative disorder VQ scan low probability  bilateral lower extremity Doppler to rule out DVT, pending  Acute on chronic renal failure Creatinine increasing despite IV fluids Will obtain a renal ultrasound Check uric acid level Continue IV hydration   Chronic diastolic heart failure  -Echocardiogram in January 2015 showed a grade 1 diastolic dysfunction with an EF of 55-60%  - Clinically compensated  Holding Lasix and lisinopril   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  - Stable without any chest pain or shortness of breath  We'll cycle  cardiac enzymes in the setting of sepsis  Chronic lower extremity edema, stasis dermatitis  - Chronic issue-as noted above-current exam consistent with chronic stasis dermatitis changes.  Resume Lasix when able   Code Status: full Family Communication: family updated about patient's clinical progress Disposition Plan:  Continue in step down unit   Brief narrative: Juan [name] is a 73 y.o. male  Who presents from home, he lives with his son. Patient has received 8 mg of IV morphine and is fairly lethargic. He is able to be aroused but unable to provide any meaningful history. History obtained from ER chart. The patient has chronic lower extremity edema right greater than left.for the last 1 months patient's and having increasing swelling and pain in his right foot and leg. Pain is so terrible that patient has been unable to bear weight. Patient does admit to chills but denies fever. In the ER, patient was noticed to be Desatting while sleeping into the 80s.the patient was found to have an increased lactic acid as well as an increased white blood cell count. His right leg was warm swollen and tender appearing as a cellulitis.of note patient had a lower sugar many ultrasound done in September 2015 negative for DVTs. There was noted to be a lot of swelling on the ultrasound. Upon further chart review. As it appears the patient was also admitted in September 2015 for a questionable right lower extremity cellulitis. The cellulitis improved on IV antibiotics. Patient is currently febrile.per ER records appears he vomited 1.   Consultants:  none  Procedures:  none  Antibiotics:  Vancomycin/Zosyn  HPI/Subjective: Patient tachycardic, complaining of bilateral feet pain and bilateral leg pain  Objective: Filed Vitals:  04/01/14 0001 04/01/14 0229 04/01/14 0420 04/01/14 0800  BP:    134/71  Pulse:      Temp: 99.7 F (37.6 C) 102.4 F (39.1 C) 98.5 F (36.9 C) 98.3 F (36.8 C)   TempSrc: Oral Oral Oral Oral  Resp:  23    Height:      Weight:      SpO2:  95% 97% 99%    Intake/Output Summary (Last 24 hours) at 04/01/14 1242 Last data filed at 04/01/14 0433  Gross per 24 hour  Intake    540 ml  Output   1100 ml  Net   -560 ml    Exam:  General: alert & oriented x 3 In NAD  Cardiovascular: RRR, nl S1 s2  Respiratory: Decreased breath sounds at the bases, scattered rhonchi, no crackles  Abdomen: soft +BS NT/ND, no masses palpable  Extremities: bilateral lower extremity edema and erythema, right greater than left      Data Reviewed: Basic Metabolic Panel:  Recent Labs Lab 03/29/14 1549 03/30/14 0305 03/31/14 0350 04/01/14 0230  NA 139 141 138 139  K 3.8 4.6 4.2 4.3  CL 101 106 103 104  CO2 $Re'20 21 21 21  'ams$ GLUCOSE 118* 109* 112* 131*  BUN 34* 34* 29* 30*  CREATININE 1.32 1.44* 1.64* 1.89*  CALCIUM 10.1 9.1 8.9 9.1    Liver Function Tests:  Recent Labs Lab 03/31/14 0350 04/01/14 0230  AST 18 22  ALT 12 12  ALKPHOS 101 122*  BILITOT 0.5 0.5  PROT 5.8* 6.1  ALBUMIN 2.2* 2.0*   No results for input(s): LIPASE, AMYLASE in the last 168 hours. No results for input(s): AMMONIA in the last 168 hours.  CBC:  Recent Labs Lab 03/29/14 1549 03/30/14 0305 03/31/14 0350 04/01/14 0230  WBC 17.8* 12.8* 20.8* 24.4*  NEUTROABS 9.8*  --   --   --   HGB 10.3* 11.1* 8.5* 8.9*  HCT 31.9* 35.7* 27.3* 27.8*  MCV 80.2 81.5 80.5 80.8  PLT 292 PLATELET CLUMPS NOTED ON SMEAR, COUNT APPEARS ADEQUATE PLATELET CLUMPS NOTED ON SMEAR, COUNT APPEARS ADEQUATE 196    Cardiac Enzymes:  Recent Labs Lab 03/29/14 1549 04/01/14 0855  TROPONINI <0.30 <0.30   BNP (last 3 results)  Recent Labs  12/09/13 0751 01/25/14 1115 03/29/14 1549  PROBNP 5630.0* 828.2* 6628.0*     CBG: No results for input(s): GLUCAP in the last 168 hours.  Recent Results (from the past 240 hour(s))  Blood culture (routine x 2)     Status: None (Preliminary result)    Collection Time: 03/29/14  3:48 PM  Result Value Ref Range Status   Specimen Description BLOOD ARM RIGHT  Final   Special Requests BOTTLES DRAWN AEROBIC AND ANAEROBIC 5CC  Final   Culture  Setup Time   Final    03/30/2014 01:30 Performed at [facility]    Culture   Final           BLOOD CULTURE RECEIVED NO GROWTH TO DATE CULTURE WILL BE HELD FOR 5 DAYS BEFORE ISSUING A FINAL NEGATIVE REPORT Performed at [facility]    Report Status PENDING  Incomplete  Blood culture (routine x 2)     Status: None (Preliminary result)   Collection Time: 03/29/14  5:39 PM  Result Value Ref Range Status   Specimen Description BLOOD RIGHT ARM  Final   Special Requests BOTTLES DRAWN AEROBIC AND ANAEROBIC 5CC  Final   Culture  Setup Time   Final  03/30/2014 01:30 Performed at [facility]   Final           BLOOD CULTURE RECEIVED NO GROWTH TO DATE CULTURE WILL BE HELD FOR 5 DAYS BEFORE ISSUING A FINAL NEGATIVE REPORT Performed at [facility]    Report Status PENDING  Incomplete  MRSA PCR Screening     Status: None   Collection Time: 03/29/14  9:25 PM  Result Value Ref Range Status   MRSA by PCR NEGATIVE NEGATIVE Final    Comment:        The GeneXpert MRSA Assay (FDA approved for NASAL specimens only), is one component of a comprehensive MRSA colonization surveillance program. It is not intended to diagnose MRSA infection nor to guide or monitor treatment for MRSA infections.      Studies: Nm Pulmonary Perf And Vent  03/31/2014   CLINICAL DATA:  Shortness of breath  EXAM: NUCLEAR MEDICINE VENTILATION - PERFUSION LUNG SCAN  TECHNIQUE: Ventilation images were obtained in multiple projections using inhaled aerosol technetium 99 M DTPA. Perfusion images were obtained in multiple projections after intravenous injection of Tc-44m MAA.  RADIOPHARMACEUTICALS:  40 mCi Tc-47m DTPA aerosol and 6 mCi Tc-41m MAA  COMPARISON:  None.  FINDINGS: Ventilation: Only  limited ventilation images were obtained. They demonstrate no definitive ventilation defect. Patient could not tolerate laying on the table.  Perfusion: Limited per fusion views were obtained and demonstrate no perfusion defect. Again full imaging could not be performed due to patient's intolerance of positioning.  IMPRESSION: Extremely limited exam although no perfusion defect to suggest pulmonary embolism is noted.   Electronically Signed   By: Inez Catalina M.D.   On: 03/31/2014 14:25   Dg Chest Port 1 View  03/31/2014   CLINICAL DATA:  73 year old male with shortness of breath.  EXAM: PORTABLE CHEST - 1 VIEW  COMPARISON:  03/29/2014 and prior chest radiographs dating back to 08/04/2004  FINDINGS: The cardiomediastinal silhouette is unchanged.  Pulmonary vascular congestion and probable interstitial edema again noted.  Bilateral lower lung atelectasis/airspace disease again noted.  Increased bony density is again noted.  IMPRESSION: Little interval change since prior study with probable pulmonary edema and bilateral lower lung atelectasis/ airspace disease.   Electronically Signed   By: Hassan Rowan M.D.   On: 03/31/2014 14:24   Dg Chest Portable 1 View  03/29/2014   CLINICAL DATA:  Cellulitis. Cough and congestion. Shortness of breath. Chest pain. Nausea, vomiting, fever. History of hypertension and smoking.  EXAM: PORTABLE CHEST - 1 VIEW  COMPARISON:  01/26/2014  FINDINGS: Shallow inflation. Heart is enlarged. Mild pulmonary edema. There are no focal consolidations. Although not well evaluated on this portable view of the chest, the bones have a somewhat mottled lucent appearance raising the question of metabolic or metastatic disorder.  IMPRESSION: 1. Cardiomegaly and pulmonary edema. 2. Question of diffuse osseous changes. Recommend correlation with history and lab values. Consider further imaging if needed.   Electronically Signed   By: Shon Hale M.D.   On: 03/29/2014 18:01    Scheduled Meds: .  feeding supplement (GLUCERNA SHAKE)  237 mL Oral BID BM  . feeding supplement (PRO-STAT SUGAR FREE 64)  30 mL Oral BID BM  . heparin  5,000 Units Subcutaneous 3 times per day  . hydrocerin   Topical Daily  . pantoprazole  40 mg Oral Daily  . piperacillin-tazobactam (ZOSYN)  IV  3.375 g Intravenous Q8H  . sodium chloride  3 mL  Intravenous Q12H  . vancomycin  1,250 mg Intravenous Q24H   Continuous Infusions: . sodium chloride 100 mL/hr at 04/01/14 0737    Active Problems:   Obstructive sleep apnea   Cellulitis   Leukocytosis   Sepsis   Morbid obesity due to excess calories   Malnutrition of moderate degree    Time spent: 40 minutes   [facility] Pager [phone]. If 7PM-7AM, please contact night-coverage at [email or web address], password [facility] 04/01/2014, 12:42 PM  LOS: 3 days

## 2014-04-01 NOTE — Progress Notes (Signed)
Bilateral lower extremity venous duplex completed:  No evidence of DVT, superficial thrombosis, or Baker's cyst.   

## 2014-04-01 NOTE — Progress Notes (Signed)
Tylenol 650mg  PO given; will continue to monitor

## 2014-04-01 NOTE — Progress Notes (Signed)
Hematology Consult: Patient not in room (went for MRI) Dr.Gorsuch to see in AM for the consult 1. Leucocytosis: Please get a Differential each time CBC is ordered. Will order it. 2. Send for Flow cytometry. 3. Previous Work-up did not show JAK-2 mutaion or BCR-ABL and patient refused bone marrow biopsy 4. 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 But its on his meds. I agree with dr.Gorsuch and will stop it. Thanks Nicholas Lose

## 2014-04-01 NOTE — Progress Notes (Signed)
PRN Metoprolol 2.5mg  given IV for HR

## 2014-04-01 NOTE — Progress Notes (Signed)
Echo Lab  2D Echocardiogram completed.  [place], RDCS 04/01/2014 8:43 AM

## 2014-04-02 ENCOUNTER — Other Ambulatory Visit: Payer: Self-pay | Admitting: Hematology and Oncology

## 2014-04-02 DIAGNOSIS — M79672 Pain in left foot: Secondary | ICD-10-CM

## 2014-04-02 DIAGNOSIS — R5081 Fever presenting with conditions classified elsewhere: Secondary | ICD-10-CM

## 2014-04-02 DIAGNOSIS — R509 Fever, unspecified: Secondary | ICD-10-CM

## 2014-04-02 DIAGNOSIS — E46 Unspecified protein-calorie malnutrition: Secondary | ICD-10-CM

## 2014-04-02 DIAGNOSIS — R5381 Other malaise: Secondary | ICD-10-CM

## 2014-04-02 DIAGNOSIS — N189 Chronic kidney disease, unspecified: Secondary | ICD-10-CM

## 2014-04-02 DIAGNOSIS — D473 Essential (hemorrhagic) thrombocythemia: Secondary | ICD-10-CM

## 2014-04-02 DIAGNOSIS — D638 Anemia in other chronic diseases classified elsewhere: Secondary | ICD-10-CM

## 2014-04-02 DIAGNOSIS — L03116 Cellulitis of left lower limb: Secondary | ICD-10-CM

## 2014-04-02 DIAGNOSIS — D72828 Other elevated white blood cell count: Secondary | ICD-10-CM

## 2014-04-02 DIAGNOSIS — L0390 Cellulitis, unspecified: Secondary | ICD-10-CM

## 2014-04-02 LAB — URINALYSIS, ROUTINE W REFLEX MICROSCOPIC
Bilirubin Urine: NEGATIVE
GLUCOSE, UA: NEGATIVE mg/dL
HGB URINE DIPSTICK: NEGATIVE
Ketones, ur: NEGATIVE mg/dL
Leukocytes, UA: NEGATIVE
Nitrite: NEGATIVE
PH: 5 (ref 5.0–8.0)
Protein, ur: 100 mg/dL — AB
Specific Gravity, Urine: 1.017 (ref 1.005–1.030)
Urobilinogen, UA: 0.2 mg/dL (ref 0.0–1.0)

## 2014-04-02 LAB — COMPREHENSIVE METABOLIC PANEL
ALBUMIN: 1.9 g/dL — AB (ref 3.5–5.2)
ALT: 12 U/L (ref 0–53)
AST: 18 U/L (ref 0–37)
Alkaline Phosphatase: 136 U/L — ABNORMAL HIGH (ref 39–117)
Anion gap: 14 (ref 5–15)
BILIRUBIN TOTAL: 0.5 mg/dL (ref 0.3–1.2)
BUN: 33 mg/dL — ABNORMAL HIGH (ref 6–23)
CO2: 22 mEq/L (ref 19–32)
CREATININE: 2.22 mg/dL — AB (ref 0.50–1.35)
Calcium: 9.2 mg/dL (ref 8.4–10.5)
Chloride: 105 mEq/L (ref 96–112)
GFR calc Af Amer: 32 mL/min — ABNORMAL LOW (ref >90)
GFR calc non Af Amer: 28 mL/min — ABNORMAL LOW (ref >90)
Glucose, Bld: 94 mg/dL (ref 70–99)
Potassium: 4.7 mEq/L (ref 3.7–5.3)
Sodium: 141 mEq/L (ref 137–147)
Total Protein: 6 g/dL (ref 6.0–8.3)

## 2014-04-02 LAB — CBC WITH DIFFERENTIAL/PLATELET
BASOS ABS: 0.3 10*3/uL — AB (ref 0.0–0.1)
BASOS PCT: 1 % (ref 0–1)
Band Neutrophils: 0 % (ref 0–10)
Blasts: 0 %
Eosinophils Absolute: 1.2 10*3/uL — ABNORMAL HIGH (ref 0.0–0.7)
Eosinophils Relative: 4 % (ref 0–5)
HEMATOCRIT: 29.5 % — AB (ref 39.0–52.0)
HEMOGLOBIN: 9.1 g/dL — AB (ref 13.0–17.0)
Lymphocytes Relative: 20 % (ref 12–46)
Lymphs Abs: 5.8 10*3/uL — ABNORMAL HIGH (ref 0.7–4.0)
MCH: 25.3 pg — AB (ref 26.0–34.0)
MCHC: 30.8 g/dL (ref 30.0–36.0)
MCV: 81.9 fL (ref 78.0–100.0)
MONOS PCT: 6 % (ref 3–12)
MYELOCYTES: 0 %
Metamyelocytes Relative: 0 %
Monocytes Absolute: 1.7 10*3/uL — ABNORMAL HIGH (ref 0.1–1.0)
NEUTROS PCT: 69 % (ref 43–77)
NRBC: 0 /100{WBCs}
Neutro Abs: 19.8 10*3/uL — ABNORMAL HIGH (ref 1.7–7.7)
Platelets: ADEQUATE 10*3/uL (ref 150–400)
Promyelocytes Absolute: 0 %
RBC: 3.6 MIL/uL — ABNORMAL LOW (ref 4.22–5.81)
RDW: 20.1 % — AB (ref 11.5–15.5)
WBC: 28.8 10*3/uL — ABNORMAL HIGH (ref 4.0–10.5)

## 2014-04-02 LAB — NA AND K (SODIUM & POTASSIUM), RAND UR: Potassium Urine: 25 mEq/L

## 2014-04-02 LAB — CREATININE, URINE, RANDOM: Creatinine, Urine: 91.43 mg/dL

## 2014-04-02 LAB — URINE MICROSCOPIC-ADD ON

## 2014-04-02 MED ORDER — ONDANSETRON HCL 4 MG/2ML IJ SOLN
4.0000 mg | Freq: Four times a day (QID) | INTRAMUSCULAR | Status: DC | PRN
  Administered 2014-04-03 – 2014-04-06 (×4): 4 mg via INTRAVENOUS
  Filled 2014-04-02 (×4): qty 2

## 2014-04-02 MED ORDER — GI COCKTAIL ~~LOC~~
30.0000 mL | Freq: Three times a day (TID) | ORAL | Status: DC | PRN
  Filled 2014-04-02: qty 30

## 2014-04-02 MED ORDER — SODIUM CHLORIDE 0.9 % IV SOLN: INTRAVENOUS | Status: DC

## 2014-04-02 MED ORDER — CEFAZOLIN SODIUM 1-5 GM-% IV SOLN
1.0000 g | Freq: Three times a day (TID) | INTRAVENOUS | Status: DC
  Administered 2014-04-02 – 2014-04-08 (×19): 1 g via INTRAVENOUS
  Filled 2014-04-02 (×23): qty 50

## 2014-04-02 MED ORDER — CEFAZOLIN SODIUM-DEXTROSE 2-3 GM-% IV SOLR
2.0000 g | Freq: Three times a day (TID) | INTRAVENOUS | Status: DC
  Filled 2014-04-02 (×3): qty 50

## 2014-04-02 NOTE — Consult Note (Signed)
[place]  [phone]   I have seen the patient, examined him and edited the notes as follows  Juan NOTE  Skye [name]                                MR#: [id number]  DOB: 01/15/1941                       CSN#: [id number]  Referring MD: Dr. Sheliah Plane [facility]   Patient Care Team: Colon Branch, MD as PCP - General Roxana Hires, MD as Consulting Physician (Hematology and Oncology)  Reason for Consult: Leukemia  YQM:Juan [name] is a 73 y.o. male With a history of recurrent leukocytosis, not on current treatment at this time due to loss of follow-up as described below, admitted with bilateral lower extremity cellulitis on 03/31/2014, Requiring IV antibiotics by pharmacy. Of note, he has recurrent cellulitis for the past year. His CBC continues to show abnormalities, with a white count on admission of 17.8 now to 28.8, with the differential showing Absolute lymphocytes of 5.8, with monocytes at 1.7 and ANC of 19.8. He also has a history of anemia and thrombocytosis. His anemia is stable. He had been on Anagrelide. He was febrile on admission, now results, but he continues to have night sweats, but no chills. He has been experiencing malaise, fatigue, over the last 2 months, short of breath on exertion, not at rest. He denies any cardiac symptoms. He denies any abdominal pain, but does have intermittent nausea and heartburn. He denies any vomiting. His appetite is significantly decreased over the last 6 months, worse over the last month when he reports losing 20 pounds. He denies any myalgias or joint pain.He has intermittent headaches but no vision changes. No cough confusion is reported. He denies any bleeding issues such as epistaxis, hematuria, hematemesis or hematochezia.  We have been kindly informed of the patient's admission, with recommendations regarding his elevated white count.  SUMMARY OF ONCOLOGIC HISTORY:   He was found to  have abnormal CBC from routine blood tests, noted to have recurrent leukocytosis since December of 2013.   In July 2014, his white blood cell count went back to normal.  Around August 2014, it became chronically elevated ranging from 12.4 to as high as 20.5.  He was referred to [facility], as acute leukemia was suspected. On 11/30/13 Dr. [name] consulted patient. 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 He had 3% blasts in the peripheral blood, which was confirmed by flow cytometry.  Patient refused bone marrow biopsy  Per chart notes, we was considered a good candidate for Jakafi, but patient declined treatment at the time stating that he would follow in [place], but failed to follow up at the [facility].  The patient also have recurrent thrombocytosis.   According to the patient, he had extensive investigations at [place] and was placed on anagrelide.  In 2007, his blood count was as high as 669,000. Since then, it has fluctuated between normal range to 472,000.  He was also noted to be chronically anemic since 2007. He is currently on oral iron supplements.    PMH:  Past Medical History  Diagnosis Date  . CAD (coronary artery disease)     dx elsewheer in past, no documentation. Non-ischemic myovue 2007  .  CHF (congestive heart failure)     dx elsewhere, no documentation. Normal EF by previous echo. Trival AS needs f/u ECHO by 2012  . Hypertension   . Sleep apnea, obstructive     at some point used CPAP, was d/c  years ago  . Anemia   . History of thrombocytosis   . Allergic rhinitis   . Edema     R>L leg, u/s 5-12 neg for DVT  . Hemorrhoid   . Type II diabetes mellitus   . Sinus congestion   . Migraine     "once/wk at least" (07/11/2013)  . Shortness of breath     Surgeries:  Past Surgical History  Procedure Laterality Date  . Toe surgery Right       "tried to straighten out big toe" (07/11/2013)    Allergies: No Known Allergies  Medications:   Prior to Admission:  Prescriptions prior to admission  Medication Sig Dispense Refill Last Dose  . acetaminophen (TYLENOL) 500 MG tablet Take 500-1,000 mg by mouth every 6 (six) hours as needed for moderate pain or headache.    Past Week at Unknown time  . anagrelide (AGRYLIN) 0.5 MG capsule Take 1 capsule (0.5 mg total) by mouth daily. 30 capsule 1 Past Week at Unknown time  . b complex vitamins capsule Take 1 capsule by mouth daily. 30 capsule 3 Past Week at Unknown time  . bacitracin 500 UNIT/GM ointment Apply 1 application topically daily as needed for wound care. 15 g 0 Past Week at Unknown time  . Ferrous Sulfate Dried 200 (65 FE) MG TABS Take 1 tablet by mouth daily.    Past Week at Unknown time  . fluticasone (FLONASE) 50 MCG/ACT nasal spray Place 2 sprays into both nostrils daily.    Past Week at Unknown time  . folic acid (FOLVITE) 1 MG tablet Take 1 mg by mouth daily.    Past Week at Unknown time  . furosemide (LASIX) 40 MG tablet TAKE 3 TABLETS BY MOUTH DAILY 90 tablet 3 Past Week at Unknown time  . hydrocerin (EUCERIN) CREA Apply Eucerin cream to BLE Q day after bathing and roughly towel drying to remove loose skin 228 g 0 Past Week at Unknown time  . HYDROcodone-acetaminophen (NORCO/VICODIN) 5-325 MG per tablet Take 1 tablet by mouth every 6 (six) hours as needed for moderate pain. 30 tablet 0 Past Week at Unknown time  . lisinopril (PRINIVIL,ZESTRIL) 20 MG tablet Take 20 mg by mouth daily.   Past Week at Unknown time  . potassium chloride SA (K-DUR,KLOR-CON) 20 MEQ tablet Take 20 mEq by mouth daily.   Past Week at Unknown time   Scheduled Meds: . feeding supplement (GLUCERNA SHAKE)  237 mL Oral BID BM  . feeding supplement (PRO-STAT SUGAR FREE 64)  30 mL Oral BID BM  . heparin  5,000 Units Subcutaneous 3 times per day  . hydrocerin   Topical Daily  . pantoprazole  40 mg Oral  Daily  . piperacillin-tazobactam (ZOSYN)  IV  3.375 g Intravenous Q8H  . sodium chloride  3 mL Intravenous Q12H  . vancomycin  1,250 mg Intravenous Q24H   Continuous Infusions:  PRN Meds:.acetaminophen **OR** acetaminophen, alum & mag hydroxide-simeth, metoprolol, morphine injection   ROS: Constitutional: Reports intermittent subjective fevers, but no chills or abnormal night sweats Eyes: Denies blurriness of vision, double vision or watery eyes Ears, nose, mouth, throat, and face: Denies mucositis or sore throat Respiratory: Denies cough,But does have shortness of breath  on exertion, denies wheezing Cardiovascular: Denies palpitation, chest discomfort . He reports lower extremity swelling, In the setting of cellulitis Gastrointestinal:  Reports nausea, heartburn, and has had change in bowel habits, with more constipation than usual last bowel movement 4 days ago Skin: Reports bilateral lower extremity cellulitis and venous stasis changes Lymphatics: Denies new lymphadenopathy or easy bruising Neurological: Denies numbness, tingling or new weaknesses Behavioral/Psych: Mood is stable, no new changes  All other systems were reviewed with the patient and are negative.   Family History:   Mother died with old age, father died with diabetes. He has 2 brothers and 1 sister in good health  Family History  Problem Relation Age of Onset  . Colon cancer Neg Hx   . Prostate cancer Neg Hx   . Heart attack Neg Hx   . Diabetes Neg Hx   . Schizophrenia Son    There is no family history of hematological or oncological disorders  Social History:  reports that he quit smoking about 47 years ago. His smoking use included Cigarettes. He has a 3 pack-year smoking history. He has never used smokeless tobacco. He reports that he does not drink alcohol or use illicit drugs. Married, has 2 children in good health, retired.   Physical Exam    ECOG PERFORMANCE STATUS: 2-3  Filed Vitals:   04/02/14  0748  BP: 109/60  Pulse: 102  Temp: 98.2 F (36.8 C)  Resp: 16   Filed Weights   03/29/14 2130  Weight: 261 lb 3.9 oz (118.5 kg)    GENERAL:alert, no distress and chronically ill appearing. He looked thinner than his last visit. SKIN: skin color, texture, turgor are normal In the upper extremities. On both lower extremities, there are significant cellulitic changes, along with venous stasis changes. There is erythema, more noticeable on the right lower extremity EYES: normal, conjunctiva are pale and non-injected, sclera clear OROPHARYNX:   no erythema and lips, buccal mucosa normal, Tongue with mild white coating NECK: supple, thyroid normal size, non-tender, without nodularity LYMPH:  no palpable lymphadenopathy in the cervical, axillary or inguinal LUNGS: clear to auscultation and percussion with normal breathing effort HEART: regular rate & rhythm and no murmurs and 2+ lower extremity edema ABDOMEN: abdomen soft, protuberant non-tender and normal bowel sounds Musculoskeletal:no cyanosis of digits and no clubbing  PSYCH: alert & oriented x 3 with fluent speech NEURO: no focal motor/sensory deficits   Labs:  CBC   Recent Labs Lab 03/29/14 1549 03/30/14 0305 03/31/14 0350 04/01/14 0230 04/01/14 1420 04/02/14 0226  WBC 17.8* 12.8* 20.8* 24.4* 27.2* 28.8*  HGB 10.3* 11.1* 8.5* 8.9* 9.1* 9.1*  HCT 31.9* 35.7* 27.3* 27.8* 28.9* 29.5*  PLT 292 PLATELET CLUMPS NOTED ON SMEAR, COUNT APPEARS ADEQUATE PLATELET CLUMPS NOTED ON SMEAR, COUNT APPEARS ADEQUATE 196 179 PLATELET CLUMPS NOTED ON SMEAR, COUNT APPEARS ADEQUATE  MCV 80.2 81.5 80.5 80.8 81.0 81.9  MCH 25.9* 25.3* 25.1* 25.9* 25.5* 25.3*  MCHC 32.3 31.1 31.1 32.0 31.5 30.8  RDW 19.9* 20.2* 20.0* 20.0* 19.9* 20.1*  LYMPHSABS 2.8  --   --   --  4.9* 5.8*  MONOABS 1.8*  --   --   --  2.2* 1.7*  EOSABS 0.5  --   --   --  0.0 1.2*  BASOSABS 2.0*  --   --   --  0.0 0.3*     CMP    Recent Labs Lab 03/29/14 1549  03/30/14 0305 03/31/14 0350 04/01/14 0230 04/02/14 7510  NA 139 141 138 139 141  K 3.8 4.6 4.2 4.3 4.7  CL 101 106 103 104 105  CO2 $Re'20 21 21 21 22  'ycW$ GLUCOSE 118* 109* 112* 131* 94  BUN 34* 34* 29* 30* 33*  CREATININE 1.32 1.44* 1.64* 1.89* 2.22*  CALCIUM 10.1 9.1 8.9 9.1 9.2  AST  --   --  $R'18 22 18  'YL$ ALT  --   --  $R'12 12 12  'uC$ ALKPHOS  --   --  101 122* 136*  BILITOT  --   --  0.5 0.5 0.5        Component Value Date/Time   BILITOT 0.5 04/02/2014 0226   BILITOT 0.35 11/16/2013 0847   BILIDIR 0.0 01/12/2013 1100   Imaging Studies:  Mr Tibia Fibula Right Wo Contrast  04/01/2014   CLINICAL DATA:  Leg swelling. Cellulitis. Abscess. Initial encounter.  EXAM: MRI OF THE RIGHT FOREFOOT WITHOUT CONTRAST; MRI OF LOWER RIGHT EXTREMITY WITHOUT CONTRAST  TECHNIQUE: Multiplanar, multisequence MR imaging was performed. No intravenous contrast was administered.  COMPARISON:  No radiographs for comparison.  FINDINGS: Abnormal bone marrow signal is present in the tibia and fibula. Bone marrow signal shows heterogenous marrow. This is a nonspecific finding most commonly associated with obesity, anemia, cigarette smoking or chronic disease.  There is diffuse subcutaneous infiltration circumferentially around the RIGHT leg, most compatible with cellulitis in the appropriate clinical setting but nonspecific. Fatty atrophy of the inferior medial head of the gastrocnemius is present. This is superimposed on mild diffuse atrophy of the leg. There is no deep soft tissue abscess. Edema of the muscular compartments may represent dependent or reactive edema. Infectious myositis is considered unlikely.  Evaluation of the foot shows similar findings with more prominent edema anterior to the ankle and in the dorsum of the foot. Minimal ankle effusion. Subtalar joints appear within normal limits. The Achilles tendon, posterior medial and posterior lateral tendon groups appear intact. No findings to suggest osteomyelitis.  Hindfoot bones appear within normal limits.  IMPRESSION: Diffuse edema of the leg and foot, which is nonspecific but can represent cellulitis, dependent edema or lymphedema. No osteomyelitis or abscess.   Electronically Signed   By: Dereck Ligas M.D.   On: 04/01/2014 12:45   Mr Foot Right Wo Contrast  04/01/2014   CLINICAL DATA:  Leg swelling. Cellulitis. Abscess. Initial encounter.  EXAM: MRI OF THE RIGHT FOREFOOT WITHOUT CONTRAST; MRI OF LOWER RIGHT EXTREMITY WITHOUT CONTRAST  TECHNIQUE: Multiplanar, multisequence MR imaging was performed. No intravenous contrast was administered.  COMPARISON:  No radiographs for comparison.  FINDINGS: Abnormal bone marrow signal is present in the tibia and fibula. Bone marrow signal shows heterogenous marrow. This is a nonspecific finding most commonly associated with obesity, anemia, cigarette smoking or chronic disease.  There is diffuse subcutaneous infiltration circumferentially around the RIGHT leg, most compatible with cellulitis in the appropriate clinical setting but nonspecific. Fatty atrophy of the inferior medial head of the gastrocnemius is present. This is superimposed on mild diffuse atrophy of the leg. There is no deep soft tissue abscess. Edema of the muscular compartments may represent dependent or reactive edema. Infectious myositis is considered unlikely.  Evaluation of the foot shows similar findings with more prominent edema anterior to the ankle and in the dorsum of the foot. Minimal ankle effusion. Subtalar joints appear within normal limits. The Achilles tendon, posterior medial and posterior lateral tendon groups appear intact. No findings to suggest osteomyelitis. Hindfoot bones appear within normal limits.  IMPRESSION:  Diffuse edema of the leg and foot, which is nonspecific but can represent cellulitis, dependent edema or lymphedema. No osteomyelitis or abscess.   Electronically Signed   By: Dereck Ligas M.D.   On: 04/01/2014 12:45   Nm  Pulmonary Perf And Vent  03/31/2014   CLINICAL DATA:  Shortness of breath  EXAM: NUCLEAR MEDICINE VENTILATION - PERFUSION LUNG SCAN  TECHNIQUE: Ventilation images were obtained in multiple projections using inhaled aerosol technetium 99 M DTPA. Perfusion images were obtained in multiple projections after intravenous injection of Tc-52m MAA.  RADIOPHARMACEUTICALS:  40 mCi Tc-34m DTPA aerosol and 6 mCi Tc-70m MAA  COMPARISON:  None.  FINDINGS: Ventilation: Only limited ventilation images were obtained. They demonstrate no definitive ventilation defect. Patient could not tolerate laying on the table.  Perfusion: Limited per fusion views were obtained and demonstrate no perfusion defect. Again full imaging could not be performed due to patient's intolerance of positioning.  IMPRESSION: Extremely limited exam although no perfusion defect to suggest pulmonary embolism is noted.   Electronically Signed   By: Inez Catalina M.D.   On: 03/31/2014 14:25   Dg Chest Port 1 View  03/31/2014   CLINICAL DATA:  73 year old male with shortness of breath.  EXAM: PORTABLE CHEST - 1 VIEW  COMPARISON:  03/29/2014 and prior chest radiographs dating back to 08/04/2004  FINDINGS: The cardiomediastinal silhouette is unchanged.  Pulmonary vascular congestion and probable interstitial edema again noted.  Bilateral lower lung atelectasis/airspace disease again noted.  Increased bony density is again noted.  IMPRESSION: Little interval change since prior study with probable pulmonary edema and bilateral lower lung atelectasis/ airspace disease.   Electronically Signed   By: Hassan Rowan M.D.   On: 03/31/2014 14:24   Dg Chest Portable 1 View  03/29/2014   CLINICAL DATA:  Cellulitis. Cough and congestion. Shortness of breath. Chest pain. Nausea, vomiting, fever. History of hypertension and smoking.  EXAM: PORTABLE CHEST - 1 VIEW  COMPARISON:  01/26/2014  FINDINGS: Shallow inflation. Heart is enlarged. Mild pulmonary edema. There are no focal  consolidations. Although not well evaluated on this portable view of the chest, the bones have a somewhat mottled lucent appearance raising the question of metabolic or metastatic disorder.  IMPRESSION: 1. Cardiomegaly and pulmonary edema. 2. Question of diffuse osseous changes. Recommend correlation with history and lab values. Consider further imaging if needed.   Electronically Signed   By: Shon Hale M.D.   On: 03/29/2014 18:01   I reviewed his peripheral smear. Leukoerythroblastic picture is noted with increased blasts. Noted tear drop cells with 1-2 schistocytes per high power field. Large platelets are noted.   Assessment/Plan: 73 y.o. 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  He refused a bone marrow biopsy. The patient is non-compliant and has very poor understanding of disease process. Juan [name] was recommended as a therapy. However, He failed to follow up at either [place].  The patient is now too weak for systemic treatment and I would not recommend repeat BM biopsy. I recommend further discussion with his son but the patient refused to give me permission to talk to his family.  Thrombocytosis According to the patient, he had extensive investigations at [place] and was placed on anagrelide. Anagrelide discontinued on 11/15 as his platelet counts  were 179 as of 11/15 Will continue to monitor.  Anemia of chronic disease Stable, no bleeding issues noted.  Hb 9.1  Continue to monitor  Bilateral lower extremity cellulitis/sepsis Sepsis Persistent Fever The patient has been placed on IV Zosyn and IV vancomycin by pharmacy after cultures were obtained, with results currently pending Fever is now resolved Continue present therapy  Malnutrition The  patient has been reporting failure to thrive, and subsequent protein calorie malnutrition, and a 20 pound weight loss over the last 4 weeks. Recommend nutritional evaluation while in the hospital  DVT prophylaxis On subcutaneous heparin three times a day per Pharmacy  Full Code  Discharge planning I have tried repeatedly persuading the patient for further investigation and treatment but he has repeatedly refused and declined treatment. He would not let me discuss his case with his family. I will sign off. There is nothing I can do to help in this case. Recommend palliative care consult and hospice referral.  Rondel Jumbo, PA-C 04/02/2014 8:08 AM  Juan Kluever, MD 04/02/2014  Other medical issues, including constipation, headaches and pain control as per admitting team

## 2014-04-02 NOTE — Progress Notes (Addendum)
[facility] PROGRESS NOTE  Juan [name] ZOX:[id number] DOB: November 09, 1940 DOA: 03/29/2014 PCP: [name], MD  Assessment/Plan: Active Problems:   Obstructive sleep apnea   Cellulitis   Leukocytosis   Sepsis   Morbid obesity due to excess calories   Malnutrition of moderate degree .   Bilateral lower extremity cellulitis/sepsis/persistent fever Continues to have low-grade fever, still tachycardic Blood culture no growth so far Continue with vancomycin, Zosyn MRI  Shows diffuse edema of the leg and the foot, no abscess Appreciate wound care consultation. Patient has a stage II pressure ulcer on his right buttock Clinically not improving  Therefore consult infectious disease today   Fever Concern for other hematologic processes contributing  to the fever, AML?concern for blast crisis Hematology consulted  WBC count increasing, with toxic granulocytes    Sinus tachycardia Was likely secondary to sepsis Patient has a history of myeloproliferative disorder VQ scan low probability  bilateral lower extremity Doppler to rule out DVT, pending  Acute on chronic renal failure Creatinine increasing despite IV fluids Will obtain a renal ultrasound Uric acid level within normal limits Continue IV hydration Nephrology consultation to discuss with Dr. 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  - Stable without any chest pain or shortness of breath  We'll cycle cardiac  enzymes in the setting of sepsis  Chronic lower extremity edema, stasis dermatitis  - Chronic issue-as noted above-current exam consistent with chronic stasis dermatitis changes.  Resume Lasix when able   Code Status: full Family Communication: family updated about patient's clinical progress Disposition Plan:  Continue in step down unit   Brief narrative: Juan [name] is a 73 y.o. male  Who presents from home, he lives with his son. Patient has received 8 mg of IV morphine and is fairly lethargic. He is able to be aroused but unable to provide any meaningful history. History obtained from ER chart. The patient has chronic lower extremity edema right greater than left.for the last 1 months patient's and having increasing swelling and pain in his right foot and leg. Pain is so terrible that patient has been unable to bear weight. Patient does admit to chills but denies fever. In the ER, patient was noticed to be Desatting while sleeping into the 80s.the patient was found to have an increased lactic acid as well as an increased white blood cell count. His right leg was warm swollen and tender appearing as a cellulitis.of note patient had a lower sugar many ultrasound done in September 2015 negative for DVTs. There was noted to be a lot of swelling on the ultrasound. Upon further chart review. As it appears the patient was also admitted in September 2015 for a questionable right lower extremity cellulitis. The cellulitis improved on IV antibiotics. Patient is currently febrile.per ER records appears he vomited 1.   Consultants:  none  Procedures:  none  Antibiotics:  Vancomycin/Zosyn  HPI/Subjective: Patient tachycardic, complaining of bilateral feet pain and bilateral leg pain  Objective: Filed Vitals:   04/02/14 0000 04/02/14 0320 04/02/14 0400  04/02/14 0748  BP: 96/69 114/58  109/60  Pulse: 122 100 125 102  Temp: 98.1 F (36.7 C) 98.1 F (36.7 C)  98.2 F (36.8 C)   TempSrc: Oral Oral  Oral  Resp: $Remo'18 20 23 16  'cztYY$ Height:      Weight:      SpO2: 96% 97% 96% 97%    Intake/Output Summary (Last 24 hours) at 04/02/14 1038 Last data filed at 04/02/14 1000  Gross per 24 hour  Intake    813 ml  Output   1150 ml  Net   -337 ml    Exam:  General: alert & oriented x 3 In NAD  Cardiovascular: RRR, nl S1 s2  Respiratory: Decreased breath sounds at the bases, scattered rhonchi, no crackles  Abdomen: soft +BS NT/ND, no masses palpable  Extremities: bilateral lower extremity edema and erythema, right greater than left      Data Reviewed: Basic Metabolic Panel:  Recent Labs Lab 03/29/14 1549 03/30/14 0305 03/31/14 0350 04/01/14 0230 04/02/14 0226  NA 139 141 138 139 141  K 3.8 4.6 4.2 4.3 4.7  CL 101 106 103 104 105  CO2 $Re'20 21 21 21 22  'LBE$ GLUCOSE 118* 109* 112* 131* 94  BUN 34* 34* 29* 30* 33*  CREATININE 1.32 1.44* 1.64* 1.89* 2.22*  CALCIUM 10.1 9.1 8.9 9.1 9.2    Liver Function Tests:  Recent Labs Lab 03/31/14 0350 04/01/14 0230 04/02/14 0226  AST $Re'18 22 18  'LtU$ ALT $R'12 12 12  'tz$ ALKPHOS 101 122* 136*  BILITOT 0.5 0.5 0.5  PROT 5.8* 6.1 6.0  ALBUMIN 2.2* 2.0* 1.9*   No results for input(s): LIPASE, AMYLASE in the last 168 hours. No results for input(s): AMMONIA in the last 168 hours.  CBC:  Recent Labs Lab 03/29/14 1549 03/30/14 0305 03/31/14 0350 04/01/14 0230 04/01/14 1420 04/02/14 0226  WBC 17.8* 12.8* 20.8* 24.4* 27.2* 28.8*  NEUTROABS 9.8*  --   --   --  18.8* 19.8*  HGB 10.3* 11.1* 8.5* 8.9* 9.1* 9.1*  HCT 31.9* 35.7* 27.3* 27.8* 28.9* 29.5*  MCV 80.2 81.5 80.5 80.8 81.0 81.9  PLT 292 PLATELET CLUMPS NOTED ON SMEAR, COUNT APPEARS ADEQUATE PLATELET CLUMPS NOTED ON SMEAR, COUNT APPEARS ADEQUATE 196 179 PLATELET CLUMPS NOTED ON SMEAR, COUNT APPEARS ADEQUATE    Cardiac Enzymes:  Recent Labs Lab 03/29/14 1549 04/01/14 0855 04/01/14 1420 04/01/14 1945  TROPONINI <0.30 <0.30 <0.30 <0.30   BNP (last 3  results)  Recent Labs  12/09/13 0751 01/25/14 1115 03/29/14 1549  PROBNP 5630.0* 828.2* 6628.0*     CBG: No results for input(s): GLUCAP in the last 168 hours.  Recent Results (from the past 240 hour(s))  Blood culture (routine x 2)     Status: None (Preliminary result)   Collection Time: 03/29/14  3:48 PM  Result Value Ref Range Status   Specimen Description BLOOD ARM RIGHT  Final   Special Requests BOTTLES DRAWN AEROBIC AND ANAEROBIC 5CC  Final   Culture  Setup Time   Final    03/30/2014 01:30 Performed at [facility]    Culture   Final           BLOOD CULTURE RECEIVED NO GROWTH TO DATE CULTURE WILL BE HELD FOR 5 DAYS BEFORE ISSUING A FINAL NEGATIVE REPORT Performed at [facility]    Report Status PENDING  Incomplete  Blood culture (routine x 2)     Status: None (Preliminary result)   Collection Time: 03/29/14  5:39 PM  Result Value Ref Range Status   Specimen Description BLOOD RIGHT ARM  Final   Special Requests BOTTLES DRAWN AEROBIC AND ANAEROBIC 5CC  Final   Culture  Setup Time   Final    03/30/2014 01:30 Performed at [facility]    Culture   Final           BLOOD CULTURE RECEIVED NO GROWTH TO DATE CULTURE WILL BE HELD FOR 5 DAYS BEFORE ISSUING A FINAL NEGATIVE REPORT Performed at [facility]    Report Status PENDING  Incomplete  MRSA PCR Screening     Status: None   Collection Time: 03/29/14  9:25 PM  Result Value Ref Range Status   MRSA by PCR NEGATIVE NEGATIVE Final    Comment:        The GeneXpert MRSA Assay (FDA approved for NASAL specimens only), is one component of a comprehensive MRSA colonization surveillance program. It is not intended to diagnose MRSA infection nor to guide or monitor treatment for MRSA infections.   Culture, blood (routine x 2)     Status: None (Preliminary result)   Collection Time: 03/31/14  6:36 PM  Result Value Ref Range Status   Specimen Description BLOOD LEFT HAND  Final   Special  Requests BOTTLES DRAWN AEROBIC AND ANAEROBIC 5CC  Final   Culture  Setup Time   Final    04/01/2014 01:38 Performed at [facility]    Culture   Final           BLOOD CULTURE RECEIVED NO GROWTH TO DATE CULTURE WILL BE HELD FOR 5 DAYS BEFORE ISSUING A FINAL NEGATIVE REPORT Performed at [facility]    Report Status PENDING  Incomplete  Culture, blood (routine x 2)     Status: None (Preliminary result)   Collection Time: 03/31/14  6:41 PM  Result Value Ref Range Status   Specimen Description BLOOD RIGHT ARM  Final   Special Requests BOTTLES DRAWN AEROBIC AND ANAEROBIC 5CC  Final   Culture  Setup Time   Final    04/01/2014 01:38 Performed at [facility]    Culture   Final           BLOOD CULTURE RECEIVED NO GROWTH TO DATE CULTURE WILL BE HELD FOR 5 DAYS BEFORE ISSUING A FINAL NEGATIVE REPORT Performed at [facility]    Report Status PENDING  Incomplete     Studies: Mr Tibia Fibula Right Wo Contrast  04/01/2014   CLINICAL DATA:  Leg swelling. Cellulitis. Abscess. Initial encounter.  EXAM: MRI OF THE RIGHT FOREFOOT WITHOUT CONTRAST; MRI OF LOWER RIGHT EXTREMITY WITHOUT CONTRAST  TECHNIQUE: Multiplanar, multisequence MR imaging was performed. No intravenous contrast was administered.  COMPARISON:  No radiographs for comparison.  FINDINGS: Abnormal bone marrow signal is present in the tibia and fibula. Bone marrow signal shows heterogenous marrow. This is a nonspecific finding most commonly associated with obesity, anemia, cigarette smoking or chronic disease.  There is diffuse subcutaneous infiltration circumferentially around the RIGHT leg, most compatible with cellulitis in the appropriate clinical setting but nonspecific. Fatty atrophy of the inferior medial head of the gastrocnemius is present. This is superimposed on mild diffuse atrophy of the leg. There is no deep soft tissue abscess. Edema of the muscular compartments may represent dependent or  reactive edema. Infectious myositis is considered unlikely.  Evaluation of the foot shows similar findings with more prominent edema anterior to the ankle and in the dorsum of  the foot. Minimal ankle effusion. Subtalar joints appear within normal limits. The Achilles tendon, posterior medial and posterior lateral tendon groups appear intact. No findings to suggest osteomyelitis. Hindfoot bones appear within normal limits.  IMPRESSION: Diffuse edema of the leg and foot, which is nonspecific but can represent cellulitis, dependent edema or lymphedema. No osteomyelitis or abscess.   Electronically Signed   By: Dereck Ligas M.D.   On: 04/01/2014 12:45   Mr Foot Right Wo Contrast  04/01/2014   CLINICAL DATA:  Leg swelling. Cellulitis. Abscess. Initial encounter.  EXAM: MRI OF THE RIGHT FOREFOOT WITHOUT CONTRAST; MRI OF LOWER RIGHT EXTREMITY WITHOUT CONTRAST  TECHNIQUE: Multiplanar, multisequence MR imaging was performed. No intravenous contrast was administered.  COMPARISON:  No radiographs for comparison.  FINDINGS: Abnormal bone marrow signal is present in the tibia and fibula. Bone marrow signal shows heterogenous marrow. This is a nonspecific finding most commonly associated with obesity, anemia, cigarette smoking or chronic disease.  There is diffuse subcutaneous infiltration circumferentially around the RIGHT leg, most compatible with cellulitis in the appropriate clinical setting but nonspecific. Fatty atrophy of the inferior medial head of the gastrocnemius is present. This is superimposed on mild diffuse atrophy of the leg. There is no deep soft tissue abscess. Edema of the muscular compartments may represent dependent or reactive edema. Infectious myositis is considered unlikely.  Evaluation of the foot shows similar findings with more prominent edema anterior to the ankle and in the dorsum of the foot. Minimal ankle effusion. Subtalar joints appear within normal limits. The Achilles tendon, posterior  medial and posterior lateral tendon groups appear intact. No findings to suggest osteomyelitis. Hindfoot bones appear within normal limits.  IMPRESSION: Diffuse edema of the leg and foot, which is nonspecific but can represent cellulitis, dependent edema or lymphedema. No osteomyelitis or abscess.   Electronically Signed   By: Dereck Ligas M.D.   On: 04/01/2014 12:45   Nm Pulmonary Perf And Vent  03/31/2014   CLINICAL DATA:  Shortness of breath  EXAM: NUCLEAR MEDICINE VENTILATION - PERFUSION LUNG SCAN  TECHNIQUE: Ventilation images were obtained in multiple projections using inhaled aerosol technetium 99 M DTPA. Perfusion images were obtained in multiple projections after intravenous injection of Tc-27m MAA.  RADIOPHARMACEUTICALS:  40 mCi Tc-77m DTPA aerosol and 6 mCi Tc-10m MAA  COMPARISON:  None.  FINDINGS: Ventilation: Only limited ventilation images were obtained. They demonstrate no definitive ventilation defect. Patient could not tolerate laying on the table.  Perfusion: Limited per fusion views were obtained and demonstrate no perfusion defect. Again full imaging could not be performed due to patient's intolerance of positioning.  IMPRESSION: Extremely limited exam although no perfusion defect to suggest pulmonary embolism is noted.   Electronically Signed   By: Inez Catalina M.D.   On: 03/31/2014 14:25   Dg Chest Port 1 View  03/31/2014   CLINICAL DATA:  73 year old male with shortness of breath.  EXAM: PORTABLE CHEST - 1 VIEW  COMPARISON:  03/29/2014 and prior chest radiographs dating back to 08/04/2004  FINDINGS: The cardiomediastinal silhouette is unchanged.  Pulmonary vascular congestion and probable interstitial edema again noted.  Bilateral lower lung atelectasis/airspace disease again noted.  Increased bony density is again noted.  IMPRESSION: Little interval change since prior study with probable pulmonary edema and bilateral lower lung atelectasis/ airspace disease.   Electronically Signed    By: Hassan Rowan M.D.   On: 03/31/2014 14:24   Dg Chest Portable 1 View  03/29/2014   CLINICAL DATA:  Cellulitis. Cough and congestion. Shortness of breath. Chest pain. Nausea, vomiting, fever. History of hypertension and smoking.  EXAM: PORTABLE CHEST - 1 VIEW  COMPARISON:  01/26/2014  FINDINGS: Shallow inflation. Heart is enlarged. Mild pulmonary edema. There are no focal consolidations. Although not well evaluated on this portable view of the chest, the bones have a somewhat mottled lucent appearance raising the question of metabolic or metastatic disorder.  IMPRESSION: 1. Cardiomegaly and pulmonary edema. 2. Question of diffuse osseous changes. Recommend correlation with history and lab values. Consider further imaging if needed.   Electronically Signed   By: Shon Hale M.D.   On: 03/29/2014 18:01    Scheduled Meds: . feeding supplement (GLUCERNA SHAKE)  237 mL Oral BID BM  . feeding supplement (PRO-STAT SUGAR FREE 64)  30 mL Oral BID BM  . heparin  5,000 Units Subcutaneous 3 times per day  . hydrocerin   Topical Daily  . pantoprazole  40 mg Oral Daily  . piperacillin-tazobactam (ZOSYN)  IV  3.375 g Intravenous Q8H  . sodium chloride  3 mL Intravenous Q12H  . vancomycin  1,250 mg Intravenous Q24H   Continuous Infusions: . sodium chloride 10 mL/hr at 04/02/14 0700    Active Problems:   Obstructive sleep apnea   Cellulitis   Leukocytosis   Sepsis   Morbid obesity due to excess calories   Malnutrition of moderate degree    Time spent: 40 minutes   [facility] Pager [phone]. If 7PM-7AM, please contact night-coverage at [email or web address], password [facility] 04/02/2014, 10:38 AM  LOS: 4 days

## 2014-04-02 NOTE — Progress Notes (Signed)
   04/02/14 1600  Clinical Encounter Type  Visited With Patient  Visit Type Initial;Spiritual support  Referral From Nurse  Spiritual Encounters  Spiritual Needs Prayer   Chaplain visited briefly with patient. Chaplain was referred to patient via spiritual care consult. Patient asked for prayer. Chaplain prayed with patient. Patient was in good spirits today and seemed to look forward to some rest this afternoon. Chaplain will continue to provide emotional and spiritual support for patient as needed. Gar Ponto, Chaplain  4:08 PM

## 2014-04-02 NOTE — Progress Notes (Signed)
Patient has under his arm in a belongings bag his wallet, cellphone and money per patient.  He would not allow [place] Letta Moynahan Monterrio Gerst,R.N.) or myself to see it.  He was informed that we could have it locked in the safe for safe keeping until discharge or family could take it home.  He refused the offer.  He is aware he is responsible for these belongings.

## 2014-04-02 NOTE — Consult Note (Signed)
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 Noted to have some transient hypotension since admission with fair urine output ranging from 702 108 8770 mL per 24 hours. No intravenous contrast exposure noted and no preceding nephrotoxin use noted. Previously on lisinopril-not restarted at this hospitalization. Urinalysis not done so far.  Concern is also raised with regards to his elevated WBC count and a hematology consultation has been done. Creatinine rise is as follows:   02/22/2014  03/29/2014  03/30/2014  03/31/2014  04/01/2014  04/02/2014   BUN 31 (H) 34 (H) 34 (H) 29 (H) 30 (H) 33 (H)  Creatinine 1.2 1.32 1.44 (H) 1.64 (H) 1.89 (H) 2.22 (H)    Past Medical History  Diagnosis Date  . CAD (coronary artery disease)     dx elsewheer in past, no documentation. Non-ischemic myovue 2007  . CHF (congestive heart failure)     dx elsewhere, no documentation. Normal EF by previous echo. Trival AS needs f/u ECHO by 2012  . Hypertension   . Sleep apnea, obstructive     at some point used CPAP, was d/c  years ago  . Anemia   . History of thrombocytosis   . Allergic rhinitis   . Edema     R>L leg, u/s 5-12 neg for DVT  . Hemorrhoid   . Type II diabetes mellitus   . Sinus congestion   . Migraine     "once/wk at least" (07/11/2013)  . Shortness of breath     Past Surgical History  Procedure Laterality Date  . Toe surgery Right     "tried to straighten out big toe" (07/11/2013)    Family History  Problem Relation Age of Onset  . Colon cancer Neg Hx   . Prostate cancer Neg Hx   . Heart attack Neg Hx   . Diabetes Neg Hx   . Schizophrenia Son     Social History:  reports that he quit  smoking about 47 years ago. His smoking use included Cigarettes. He has a 3 pack-year smoking history. He has never used smokeless tobacco. He reports that he does not drink alcohol or use illicit drugs.  Allergies: No Known Allergies  Medications:  Scheduled: . feeding supplement (GLUCERNA SHAKE)  237 mL Oral BID BM  . feeding supplement (PRO-STAT SUGAR FREE 64)  30 mL Oral BID BM  . heparin  5,000 Units Subcutaneous 3 times per day  . hydrocerin   Topical Daily  . pantoprazole  40 mg Oral Daily  . piperacillin-tazobactam (ZOSYN)  IV  3.375 g Intravenous Q8H  . sodium chloride  3 mL Intravenous Q12H  . vancomycin  1,250 mg Intravenous Q24H    Results for orders placed or performed during the hospital encounter of 03/29/14 (from the past 48 hour(s))  Culture, blood (routine x 2)     Status: None (Preliminary result)   Collection Time: 03/31/14  6:36 PM  Result Value Ref Range   Specimen Description BLOOD LEFT HAND    Special Requests BOTTLES DRAWN AEROBIC AND ANAEROBIC 5CC    Culture  Setup Time      04/01/2014 01:38 Performed at [facility]  BLOOD CULTURE RECEIVED NO GROWTH TO DATE CULTURE WILL BE HELD FOR 5 DAYS BEFORE ISSUING A FINAL NEGATIVE REPORT Performed at [facility]    Report Status PENDING   Vancomycin, trough     Status: Abnormal   Collection Time: 03/31/14  6:41 PM  Result Value Ref Range   Vancomycin Tr 21.0 (H) 10.0 - 20.0 ug/mL  Culture, blood (routine x 2)     Status: None (Preliminary result)   Collection Time: 03/31/14  6:41 PM  Result Value Ref Range   Specimen Description BLOOD RIGHT ARM    Special Requests BOTTLES DRAWN AEROBIC AND ANAEROBIC 5CC    Culture  Setup Time      04/01/2014 01:38 Performed at [facility]    Culture             BLOOD CULTURE RECEIVED NO GROWTH TO DATE CULTURE WILL BE HELD FOR 5 DAYS BEFORE ISSUING A FINAL NEGATIVE REPORT Performed at [facility]    Report Status  PENDING   CBC     Status: Abnormal   Collection Time: 04/01/14  2:30 AM  Result Value Ref Range   WBC 24.4 (H) 4.0 - 10.5 K/uL    Comment: WHITE COUNT CONFIRMED ON SMEAR   RBC 3.44 (L) 4.22 - 5.81 MIL/uL   Hemoglobin 8.9 (L) 13.0 - 17.0 g/dL   HCT 84.7 (L) 58.2 - 62.6 %   MCV 80.8 78.0 - 100.0 fL   MCH 25.9 (L) 26.0 - 34.0 pg   MCHC 32.0 30.0 - 36.0 g/dL   RDW 34.7 (H) 30.7 - 47.6 %   Platelets 196 150 - 400 K/uL    Comment: PLATELET COUNT CONFIRMED BY SMEAR LARGE PLATELETS PRESENT   Comprehensive metabolic panel     Status: Abnormal   Collection Time: 04/01/14  2:30 AM  Result Value Ref Range   Sodium 139 137 - 147 mEq/L   Potassium 4.3 3.7 - 5.3 mEq/L   Chloride 104 96 - 112 mEq/L   CO2 21 19 - 32 mEq/L   Glucose, Bld 131 (H) 70 - 99 mg/dL   BUN 30 (H) 6 - 23 mg/dL   Creatinine, Ser 2.77 (H) 0.50 - 1.35 mg/dL   Calcium 9.1 8.4 - 28.9 mg/dL   Total Protein 6.1 6.0 - 8.3 g/dL   Albumin 2.0 (L) 3.5 - 5.2 g/dL   AST 22 0 - 37 U/L   ALT 12 0 - 53 U/L   Alkaline Phosphatase 122 (H) 39 - 117 U/L   Total Bilirubin 0.5 0.3 - 1.2 mg/dL   GFR calc non Af Amer 34 (L) >90 mL/min   GFR calc Af Amer 39 (L) >90 mL/min    Comment: (NOTE) The eGFR has been calculated using the CKD EPI equation. This calculation has not been validated in all clinical situations. eGFR's persistently <90 mL/min signify possible Chronic Kidney Disease.    Anion gap 14 5 - 15  Troponin I     Status: None   Collection Time: 04/01/14  8:55 AM  Result Value Ref Range   Troponin I <0.30 <0.30 ng/mL    Comment:        Due to the release kinetics of cTnI, a negative result within the first hours of the onset of symptoms does not rule out myocardial infarction with certainty. If myocardial infarction is still suspected, repeat the test at appropriate intervals.   Uric acid     Status: None   Collection Time:  04/01/14  8:55 AM  Result Value Ref Range   Uric Acid, Serum 7.4 4.0 - 7.8 mg/dL  Lactate  dehydrogenase     Status: Abnormal   Collection Time: 04/01/14  8:55 AM  Result Value Ref Range   LDH 1386 (H) 94 - 250 U/L  Troponin I     Status: None   Collection Time: 04/01/14  2:20 PM  Result Value Ref Range   Troponin I <0.30 <0.30 ng/mL    Comment:        Due to the release kinetics of cTnI, a negative result within the first hours of the onset of symptoms does not rule out myocardial infarction with certainty. If myocardial infarction is still suspected, repeat the test at appropriate intervals.   Differential     Status: Abnormal   Collection Time: 04/01/14  2:20 PM  Result Value Ref Range   Neutrophils Relative % 22 (L) 43 - 77 %   Lymphocytes Relative 18 12 - 46 %   Monocytes Relative 8 3 - 12 %   Eosinophils Relative 0 0 - 5 %   Basophils Relative 0 0 - 1 %   Band Neutrophils 16 (H) 0 - 10 %   Metamyelocytes Relative 6 %   Myelocytes 15 %   Promyelocytes Absolute 10 %   Blasts 5 %   Neutro Abs 18.8 (H) 1.7 - 7.7 K/uL   Lymphs Abs 4.9 (H) 0.7 - 4.0 K/uL   Monocytes Absolute 2.2 (H) 0.1 - 1.0 K/uL   Eosinophils Absolute 0.0 0.0 - 0.7 K/uL   Basophils Absolute 0.0 0.0 - 0.1 K/uL   RBC Morphology POLYCHROMASIA PRESENT     Comment: RARE NRBCs   WBC Morphology INCREASED BANDS (>20% BANDS)     Comment: MARKED LEFT SHIFT (>5% METAS,MYELOS AND PROS, OCC BLAST NOTED)  CBC     Status: Abnormal   Collection Time: 04/01/14  2:20 PM  Result Value Ref Range   WBC 27.2 (H) 4.0 - 10.5 K/uL    Comment: WHITE COUNT CONFIRMED ON SMEAR   RBC 3.57 (L) 4.22 - 5.81 MIL/uL   Hemoglobin 9.1 (L) 13.0 - 17.0 g/dL   HCT 28.9 (L) 39.0 - 52.0 %   MCV 81.0 78.0 - 100.0 fL   MCH 25.5 (L) 26.0 - 34.0 pg   MCHC 31.5 30.0 - 36.0 g/dL   RDW 19.9 (H) 11.5 - 15.5 %   Platelets 179 150 - 400 K/uL    Comment: PLATELET COUNT CONFIRMED BY SMEAR LARGE PLATELETS PRESENT   Troponin I     Status: None   Collection Time: 04/01/14  7:45 PM  Result Value Ref Range   Troponin I <0.30 <0.30 ng/mL     Comment:        Due to the release kinetics of cTnI, a negative result within the first hours of the onset of symptoms does not rule out myocardial infarction with certainty. If myocardial infarction is still suspected, repeat the test at appropriate intervals.   CBC with Differential     Status: Abnormal   Collection Time: 04/02/14  2:26 AM  Result Value Ref Range   WBC 28.8 (H) 4.0 - 10.5 K/uL    Comment: REPEATED TO VERIFY WHITE COUNT CONFIRMED ON SMEAR    RBC 3.60 (L) 4.22 - 5.81 MIL/uL   Hemoglobin 9.1 (L) 13.0 - 17.0 g/dL   HCT 29.5 (L) 39.0 - 52.0 %   MCV 81.9 78.0 - 100.0 fL   MCH  25.3 (L) 26.0 - 34.0 pg   MCHC 30.8 30.0 - 36.0 g/dL   RDW 20.1 (H) 11.5 - 15.5 %   Platelets  150 - 400 K/uL    PLATELET CLUMPS NOTED ON SMEAR, COUNT APPEARS ADEQUATE   Neutro Abs 19.8 (H) 1.7 - 7.7 K/uL   Lymphs Abs 5.8 (H) 0.7 - 4.0 K/uL   Monocytes Absolute 1.7 (H) 0.1 - 1.0 K/uL   Eosinophils Absolute 1.2 (H) 0.0 - 0.7 K/uL   Basophils Absolute 0.3 (H) 0.0 - 0.1 K/uL   Neutrophils Relative % 69 43 - 77 %   Lymphocytes Relative 20 12 - 46 %   Monocytes Relative 6 3 - 12 %   Eosinophils Relative 4 0 - 5 %   Basophils Relative 1 0 - 1 %   Band Neutrophils 0 0 - 10 %   Metamyelocytes Relative 0 %   Myelocytes 0 %   Promyelocytes Absolute 0 %   Blasts 0 %   nRBC 0 0 /100 WBC   WBC Morphology TOXIC GRANULATION     Comment: DOHLE BODIES MARKED LEFT SHIFT (>5% METAS,MYELOS AND PROS, OCC BLAST NOTED) BLASTS INCREASED BANDS (>20% BANDS) CORRECTED ON 11/16 AT 0436: PREVIOUSLY REPORTED AS TOXIC GRANULATION DOHLE BODIES MARKED LEFT SHIFT (>5% METAS,MYELOS AND PROS, OCC BLAST NOTED) BLASTS   Comprehensive metabolic panel     Status: Abnormal   Collection Time: 04/02/14  2:26 AM  Result Value Ref Range   Sodium 141 137 - 147 mEq/L   Potassium 4.7 3.7 - 5.3 mEq/L   Chloride 105 96 - 112 mEq/L   CO2 22 19 - 32 mEq/L   Glucose, Bld 94 70 - 99 mg/dL   BUN 33 (H) 6 - 23 mg/dL    Creatinine, Ser 2.22 (H) 0.50 - 1.35 mg/dL   Calcium 9.2 8.4 - 10.5 mg/dL   Total Protein 6.0 6.0 - 8.3 g/dL   Albumin 1.9 (L) 3.5 - 5.2 g/dL   AST 18 0 - 37 U/L   ALT 12 0 - 53 U/L   Alkaline Phosphatase 136 (H) 39 - 117 U/L   Total Bilirubin 0.5 0.3 - 1.2 mg/dL   GFR calc non Af Amer 28 (L) >90 mL/min   GFR calc Af Amer 32 (L) >90 mL/min    Comment: (NOTE) The eGFR has been calculated using the CKD EPI equation. This calculation has not been validated in all clinical situations. eGFR's persistently <90 mL/min signify possible Chronic Kidney Disease.    Anion gap 14 5 - 15    Mr Tibia Fibula Right Wo Contrast  04/01/2014   CLINICAL DATA:  Leg swelling. Cellulitis. Abscess. Initial encounter.  EXAM: MRI OF THE RIGHT FOREFOOT WITHOUT CONTRAST; MRI OF LOWER RIGHT EXTREMITY WITHOUT CONTRAST  TECHNIQUE: Multiplanar, multisequence MR imaging was performed. No intravenous contrast was administered.  COMPARISON:  No radiographs for comparison.  FINDINGS: Abnormal bone marrow signal is present in the tibia and fibula. Bone marrow signal shows heterogenous marrow. This is a nonspecific finding most commonly associated with obesity, anemia, cigarette smoking or chronic disease.  There is diffuse subcutaneous infiltration circumferentially around the RIGHT leg, most compatible with cellulitis in the appropriate clinical setting but nonspecific. Fatty atrophy of the inferior medial head of the gastrocnemius is present. This is superimposed on mild diffuse atrophy of the leg. There is no deep soft tissue abscess. Edema of the muscular compartments may represent dependent or reactive edema. Infectious myositis is considered unlikely.  Evaluation of  the foot shows similar findings with more prominent edema anterior to the ankle and in the dorsum of the foot. Minimal ankle effusion. Subtalar joints appear within normal limits. The Achilles tendon, posterior medial and posterior lateral tendon groups appear  intact. No findings to suggest osteomyelitis. Hindfoot bones appear within normal limits.  IMPRESSION: Diffuse edema of the leg and foot, which is nonspecific but can represent cellulitis, dependent edema or lymphedema. No osteomyelitis or abscess.   Electronically Signed   By: Andreas Newport M.D.   On: 04/01/2014 12:45   Mr Foot Right Wo Contrast  04/01/2014   CLINICAL DATA:  Leg swelling. Cellulitis. Abscess. Initial encounter.  EXAM: MRI OF THE RIGHT FOREFOOT WITHOUT CONTRAST; MRI OF LOWER RIGHT EXTREMITY WITHOUT CONTRAST  TECHNIQUE: Multiplanar, multisequence MR imaging was performed. No intravenous contrast was administered.  COMPARISON:  No radiographs for comparison.  FINDINGS: Abnormal bone marrow signal is present in the tibia and fibula. Bone marrow signal shows heterogenous marrow. This is a nonspecific finding most commonly associated with obesity, anemia, cigarette smoking or chronic disease.  There is diffuse subcutaneous infiltration circumferentially around the RIGHT leg, most compatible with cellulitis in the appropriate clinical setting but nonspecific. Fatty atrophy of the inferior medial head of the gastrocnemius is present. This is superimposed on mild diffuse atrophy of the leg. There is no deep soft tissue abscess. Edema of the muscular compartments may represent dependent or reactive edema. Infectious myositis is considered unlikely.  Evaluation of the foot shows similar findings with more prominent edema anterior to the ankle and in the dorsum of the foot. Minimal ankle effusion. Subtalar joints appear within normal limits. The Achilles tendon, posterior medial and posterior lateral tendon groups appear intact. No findings to suggest osteomyelitis. Hindfoot bones appear within normal limits.  IMPRESSION: Diffuse edema of the leg and foot, which is nonspecific but can represent cellulitis, dependent edema or lymphedema. No osteomyelitis or abscess.   Electronically Signed   By: Andreas Newport M.D.   On: 04/01/2014 12:45   Nm Pulmonary Perf And Vent  03/31/2014   CLINICAL DATA:  Shortness of breath  EXAM: NUCLEAR MEDICINE VENTILATION - PERFUSION LUNG SCAN  TECHNIQUE: Ventilation images were obtained in multiple projections using inhaled aerosol technetium 99 M DTPA. Perfusion images were obtained in multiple projections after intravenous injection of Tc-67m MAA.  RADIOPHARMACEUTICALS:  40 mCi Tc-63m DTPA aerosol and 6 mCi Tc-27m MAA  COMPARISON:  None.  FINDINGS: Ventilation: Only limited ventilation images were obtained. They demonstrate no definitive ventilation defect. Patient could not tolerate laying on the table.  Perfusion: Limited per fusion views were obtained and demonstrate no perfusion defect. Again full imaging could not be performed due to patient's intolerance of positioning.  IMPRESSION: Extremely limited exam although no perfusion defect to suggest pulmonary embolism is noted.   Electronically Signed   By: Alcide Clever M.D.   On: 03/31/2014 14:25    Review of Systems  Constitutional: Positive for chills, weight loss and malaise/fatigue. Negative for fever.  HENT: Negative for congestion, hearing loss and tinnitus.   Eyes: Negative.   Respiratory: Negative.   Cardiovascular: Negative.   Genitourinary: Negative.   Musculoskeletal:       Pain in both legs  Skin: Positive for rash.       Over legs  Neurological: Positive for weakness. Negative for dizziness, tingling, tremors and headaches.   Blood pressure 135/68, pulse 116, temperature 98.8 F (37.1 C), temperature source Oral, resp. rate 17, height 6'  2" (1.88 m), weight 118.5 kg (261 lb 3.9 oz), SpO2 100 %. Physical Exam  Nursing note and vitals reviewed. Constitutional: He appears well-developed and well-nourished.  Somnolent-awakens intermittently to tell me to come back later so that he can rest  HENT:  Head: Normocephalic and atraumatic.  Nose: Nose normal.  Mouth/Throat: No oropharyngeal exudate.   Eyes: Conjunctivae and EOM are normal. Pupils are equal, round, and reactive to light. No scleral icterus.  Neck: Normal range of motion. Neck supple. No JVD present. No tracheal deviation present. No thyromegaly present.  Cardiovascular: Regular rhythm and normal heart sounds.   Regular tachycardia  Respiratory: Effort normal and breath sounds normal. No respiratory distress. He has no wheezes. He has no rales.  GI: Soft. Bowel sounds are normal. He exhibits no distension. There is no tenderness. There is no rebound.  Musculoskeletal: He exhibits edema and tenderness.  3+ bilateral tender edema  Neurological:  Somnolent but oriented to place/person  Skin: Skin is warm. There is erythema.    Assessment/Plan: 1. 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 A urinalysis, urine electrolytes and renal ultrasound are pending. He appears to be hypervolemic on physical exam with impressive bipedal edema but does not have any evidence of pulmonary edema. No acute electrolyte abnormalities noted. No clear nephrotoxin exposure noted. We'll continue to follow closely and address emerging needs. 2. Bilateral lower extremity cellulitis with associated sepsis: On broad-spectrum empiric antibiotic therapy, continue to monitor and support blood pressure. 3. Anemia/leukocytosis:with prior history of myelodysplastic syndrome that he did not complete follow-up for, reevaluation undertaken by hematology. 4. Sinus tachycardia: is appears to be related to infection/sepsis, VQ scan negative for PE  Nannie Starzyk K. 04/02/2014, 12:51 PM

## 2014-04-02 NOTE — Consult Note (Signed)
[facility]  Total days of antibiotics 5        Day 5 vanco        Day 5 piptazo               Reason for Consult: fevers and cellulitis    Referring Physician: abrol  Active Problems:   Obstructive sleep apnea   Cellulitis   Leukocytosis   Sepsis   Morbid obesity due to excess calories   Malnutrition of moderate degree    HPI: Juan [name] is a 73 y.o. 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 He has at baseline R > L leg edema, but signs of chronic venous stasis, and venous dermatitis. He reports worsening right LE swelling and tendnerness. He was started on vancomycin and piptazo. Fever curve slightly improved by day 5. He underwent MRI that showed only diffuse edema to leg and foot and no signs of abscess or osteomyelitis. He had recent US in mid [place] which was negative for DVT. Repeat blood cx on 11/12 and 11/14 did not have growht. He was evaluated by wound care who assessed chronic venous stasis skin changes with hemosiderin staining and venous dermatitis, hand deamed he would not be a candidate for compression due to the extent of the cellulitis he has. He also has Stage II pressure ulcer with associated MASD (moisture associated skin damage) measuring 3cm x 1.0cm x 0.2cm has clean, pink, moist wound bed with surrounding area intact but with some mild erythema. He was started on Silicone foam to protect and insulate the ulcer vs.barrier cream only. Eucerin cream to bilateral LE for venous dermatitis.  He was hospitalized in the spring 2015 with left leg cellulitis and ulcer with superficial swab showing pan sensitive pseudomonas and mssa, which has now resolved  Past Medical History  Diagnosis Date  . CAD (coronary artery disease)     dx elsewheer in past, no documentation. Non-ischemic myovue 2007  . CHF  (congestive heart failure)     dx elsewhere, no documentation. Normal EF by previous echo. Trival AS needs f/u ECHO by 2012  . Hypertension   . Sleep apnea, obstructive     at some point used CPAP, was d/c  years ago  . Anemia   . History of thrombocytosis   . Allergic rhinitis   . Edema     R>L leg, u/s 5-12 neg for DVT  . Hemorrhoid   . Type II diabetes mellitus   . Sinus congestion   . Migraine     "once/wk at least" (07/11/2013)  . Shortness of breath     Allergies: No Known Allergies   MEDICATIONS: . feeding supplement (GLUCERNA SHAKE)  237 mL Oral BID BM  . feeding supplement (PRO-STAT SUGAR FREE 64)  30 mL Oral BID BM  . heparin  5,000 Units Subcutaneous 3 times per day  . hydrocerin   Topical Daily  . pantoprazole  40 mg Oral Daily  . piperacillin-tazobactam (ZOSYN)  IV  3.375 g Intravenous Q8H  . sodium chloride  3 mL Intravenous Q12H  . vancomycin  1,250 mg Intravenous Q24H    History  Substance Use Topics  . Smoking status: Former Smoker -- 0.25 packs/day for 12 years    Types: Cigarettes    Quit date: 05/18/1966  . Smokeless tobacco: Never Used  .  Alcohol Use: No    Family History  Problem Relation Age of Onset  . Colon cancer Neg Hx   . Prostate cancer Neg Hx   . Heart attack Neg Hx   . Diabetes Neg Hx   . Schizophrenia Son     Review of Systems  Constitutional: Negative for fever, chills, diaphoresis, activity change, appetite change, fatigue and unexpected weight change.  HENT: Negative for congestion, sore throat, rhinorrhea, sneezing, trouble swallowing and sinus pressure.  Eyes: Negative for photophobia and visual disturbance.  Respiratory: Negative for cough, chest tightness, shortness of breath, wheezing and stridor.  Cardiovascular: Negative for chest pain, palpitations and leg swelling.  Gastrointestinal: Negative for nausea, vomiting, abdominal pain, diarrhea, constipation, blood in stool, abdominal distention and anal bleeding.    Genitourinary: Negative for dysuria, hematuria, flank pain and difficulty urinating.  Musculoskeletal: Negative for myalgias, back pain, joint swelling, arthralgias and gait problem.  Skin: Negative for color change, pallor, rash and wound.  Neurological: Negative for dizziness, tremors, weakness and light-headedness.  Hematological: Negative for adenopathy. Does not bruise/bleed easily.  Psychiatric/Behavioral: Negative for behavioral problems, confusion, sleep disturbance, dysphoric mood, decreased concentration and agitation.     OBJECTIVE: Temp:  [98 F (36.7 C)-100.2 F (37.9 C)] 98.8 F (37.1 C) (11/16 1144) Pulse Rate:  [100-138] 116 (11/16 1144) Resp:  [13-25] 17 (11/16 1144) BP: (96-135)/(54-100) 135/68 mmHg (11/16 1144) SpO2:  [94 %-100 %] 100 % (11/16 1144) Physical Exam  Constitutional: He is oriented to person, place. He appears chronically ill. No distress.  HENT:  Mouth/Throat: Oropharynx is clear and moist. No oropharyngeal exudate.  Cardiovascular: Normal rate, regular rhythm and normal heart sounds. Exam reveals no gallop and no friction rub.  No murmur heard.  Pulmonary/Chest: Effort normal and breath sounds normal. No respiratory distress. He has no wheezes.  Abdominal: Soft. Bowel sounds are normal. He exhibits no distension. There is no tenderness.  Lymphadenopathy:  He has no cervical adenopathy.  Neurological: He is alert and oriented to person, place, and time. Pain with supination of foot radiating to hip Skin: chronic venous stasis changes to legs bilaterally. Right leg increasing swelling to left. Erythema improved, now localized to ankle. Venous dermatitis. No excessive warmth noticeable Psychiatric: He has a normal mood and affect. His behavior is normal.    LABS: Results for orders placed or performed during the hospital encounter of 03/29/14 (from the past 48 hour(s))  Culture, blood (routine x 2)     Status: None (Preliminary result)   Collection  Time: 03/31/14  6:36 PM  Result Value Ref Range   Specimen Description BLOOD LEFT HAND    Special Requests BOTTLES DRAWN AEROBIC AND ANAEROBIC 5CC    Culture  Setup Time      04/01/2014 01:38 Performed at [facility] NO GROWTH TO DATE CULTURE WILL BE HELD FOR 5 DAYS BEFORE ISSUING A FINAL NEGATIVE REPORT Performed at [facility]    Report Status PENDING   Vancomycin, trough     Status: Abnormal   Collection Time: 03/31/14  6:41 PM  Result Value Ref Range   Vancomycin Tr 21.0 (H) 10.0 - 20.0 ug/mL  Culture, blood (routine x 2)     Status: None (Preliminary result)   Collection Time: 03/31/14  6:41 PM  Result Value Ref Range   Specimen Description BLOOD RIGHT ARM    Special Requests BOTTLES  DRAWN AEROBIC AND ANAEROBIC 5CC    Culture  Setup Time      04/01/2014 01:38 Performed at [facility]    Culture             BLOOD CULTURE RECEIVED NO GROWTH TO DATE CULTURE WILL BE HELD FOR 5 DAYS BEFORE ISSUING A FINAL NEGATIVE REPORT Performed at [facility]    Report Status PENDING   CBC     Status: Abnormal   Collection Time: 04/01/14  2:30 AM  Result Value Ref Range   WBC 24.4 (H) 4.0 - 10.5 K/uL    Comment: WHITE COUNT CONFIRMED ON SMEAR   RBC 3.44 (L) 4.22 - 5.81 MIL/uL   Hemoglobin 8.9 (L) 13.0 - 17.0 g/dL   HCT 27.8 (L) 39.0 - 52.0 %   MCV 80.8 78.0 - 100.0 fL   MCH 25.9 (L) 26.0 - 34.0 pg   MCHC 32.0 30.0 - 36.0 g/dL   RDW 20.0 (H) 11.5 - 15.5 %   Platelets 196 150 - 400 K/uL    Comment: PLATELET COUNT CONFIRMED BY SMEAR LARGE PLATELETS PRESENT   Comprehensive metabolic panel     Status: Abnormal   Collection Time: 04/01/14  2:30 AM  Result Value Ref Range   Sodium 139 137 - 147 mEq/L   Potassium 4.3 3.7 - 5.3 mEq/L   Chloride 104 96 - 112 mEq/L   CO2 21 19 - 32 mEq/L   Glucose, Bld 131 (H) 70 - 99 mg/dL   BUN 30 (H) 6 - 23 mg/dL   Creatinine, Ser 1.89 (H) 0.50 - 1.35 mg/dL   Calcium  9.1 8.4 - 10.5 mg/dL   Total Protein 6.1 6.0 - 8.3 g/dL   Albumin 2.0 (L) 3.5 - 5.2 g/dL   AST 22 0 - 37 U/L   ALT 12 0 - 53 U/L   Alkaline Phosphatase 122 (H) 39 - 117 U/L   Total Bilirubin 0.5 0.3 - 1.2 mg/dL   GFR calc non Af Amer 34 (L) >90 mL/min   GFR calc Af Amer 39 (L) >90 mL/min    Comment: (NOTE) The eGFR has been calculated using the CKD EPI equation. This calculation has not been validated in all clinical situations. eGFR's persistently <90 mL/min signify possible Chronic Kidney Disease.    Anion gap 14 5 - 15  Troponin I     Status: None   Collection Time: 04/01/14  8:55 AM  Result Value Ref Range   Troponin I <0.30 <0.30 ng/mL    Comment:        Due to the release kinetics of cTnI, a negative result within the first hours of the onset of symptoms does not rule out myocardial infarction with certainty. If myocardial infarction is still suspected, repeat the test at appropriate intervals.   Uric acid     Status: None   Collection Time: 04/01/14  8:55 AM  Result Value Ref Range   Uric Acid, Serum 7.4 4.0 - 7.8 mg/dL  Lactate dehydrogenase     Status: Abnormal   Collection Time: 04/01/14  8:55 AM  Result Value Ref Range   LDH 1386 (H) 94 - 250 U/L  Troponin I     Status: None   Collection Time: 04/01/14  2:20 PM  Result Value Ref Range   Troponin I <0.30 <0.30 ng/mL    Comment:        Due to the release kinetics of cTnI, a negative result within the first hours  of the onset of symptoms does not rule out myocardial infarction with certainty. If myocardial infarction is still suspected, repeat the test at appropriate intervals.   Differential     Status: Abnormal   Collection Time: 04/01/14  2:20 PM  Result Value Ref Range   Neutrophils Relative % 22 (L) 43 - 77 %   Lymphocytes Relative 18 12 - 46 %   Monocytes Relative 8 3 - 12 %   Eosinophils Relative 0 0 - 5 %   Basophils Relative 0 0 - 1 %   Band Neutrophils 16 (H) 0 - 10 %   Metamyelocytes  Relative 6 %   Myelocytes 15 %   Promyelocytes Absolute 10 %   Blasts 5 %   Neutro Abs 18.8 (H) 1.7 - 7.7 K/uL   Lymphs Abs 4.9 (H) 0.7 - 4.0 K/uL   Monocytes Absolute 2.2 (H) 0.1 - 1.0 K/uL   Eosinophils Absolute 0.0 0.0 - 0.7 K/uL   Basophils Absolute 0.0 0.0 - 0.1 K/uL   RBC Morphology POLYCHROMASIA PRESENT     Comment: RARE NRBCs   WBC Morphology INCREASED BANDS (>20% BANDS)     Comment: MARKED LEFT SHIFT (>5% METAS,MYELOS AND PROS, OCC BLAST NOTED)  CBC     Status: Abnormal   Collection Time: 04/01/14  2:20 PM  Result Value Ref Range   WBC 27.2 (H) 4.0 - 10.5 K/uL    Comment: WHITE COUNT CONFIRMED ON SMEAR   RBC 3.57 (L) 4.22 - 5.81 MIL/uL   Hemoglobin 9.1 (L) 13.0 - 17.0 g/dL   HCT 28.9 (L) 39.0 - 52.0 %   MCV 81.0 78.0 - 100.0 fL   MCH 25.5 (L) 26.0 - 34.0 pg   MCHC 31.5 30.0 - 36.0 g/dL   RDW 19.9 (H) 11.5 - 15.5 %   Platelets 179 150 - 400 K/uL    Comment: PLATELET COUNT CONFIRMED BY SMEAR LARGE PLATELETS PRESENT   Troponin I     Status: None   Collection Time: 04/01/14  7:45 PM  Result Value Ref Range   Troponin I <0.30 <0.30 ng/mL    Comment:        Due to the release kinetics of cTnI, a negative result within the first hours of the onset of symptoms does not rule out myocardial infarction with certainty. If myocardial infarction is still suspected, repeat the test at appropriate intervals.   CBC with Differential     Status: Abnormal   Collection Time: 04/02/14  2:26 AM  Result Value Ref Range   WBC 28.8 (H) 4.0 - 10.5 K/uL    Comment: REPEATED TO VERIFY WHITE COUNT CONFIRMED ON SMEAR    RBC 3.60 (L) 4.22 - 5.81 MIL/uL   Hemoglobin 9.1 (L) 13.0 - 17.0 g/dL   HCT 29.5 (L) 39.0 - 52.0 %   MCV 81.9 78.0 - 100.0 fL   MCH 25.3 (L) 26.0 - 34.0 pg   MCHC 30.8 30.0 - 36.0 g/dL   RDW 20.1 (H) 11.5 - 15.5 %   Platelets  150 - 400 K/uL    PLATELET CLUMPS NOTED ON SMEAR, COUNT APPEARS ADEQUATE   Neutro Abs 19.8 (H) 1.7 - 7.7 K/uL   Lymphs Abs 5.8 (H) 0.7 -  4.0 K/uL   Monocytes Absolute 1.7 (H) 0.1 - 1.0 K/uL   Eosinophils Absolute 1.2 (H) 0.0 - 0.7 K/uL   Basophils Absolute 0.3 (H) 0.0 - 0.1 K/uL   Neutrophils Relative % 69 43 - 77 %  Lymphocytes Relative 20 12 - 46 %   Monocytes Relative 6 3 - 12 %   Eosinophils Relative 4 0 - 5 %   Basophils Relative 1 0 - 1 %   Band Neutrophils 0 0 - 10 %   Metamyelocytes Relative 0 %   Myelocytes 0 %   Promyelocytes Absolute 0 %   Blasts 0 %   nRBC 0 0 /100 WBC   WBC Morphology TOXIC GRANULATION     Comment: DOHLE BODIES MARKED LEFT SHIFT (>5% METAS,MYELOS AND PROS, OCC BLAST NOTED) BLASTS INCREASED BANDS (>20% BANDS) CORRECTED ON 11/16 AT 0436: PREVIOUSLY REPORTED AS TOXIC GRANULATION DOHLE BODIES MARKED LEFT SHIFT (>5% METAS,MYELOS AND PROS, OCC BLAST NOTED) BLASTS   Comprehensive metabolic panel     Status: Abnormal   Collection Time: 04/02/14  2:26 AM  Result Value Ref Range   Sodium 141 137 - 147 mEq/L   Potassium 4.7 3.7 - 5.3 mEq/L   Chloride 105 96 - 112 mEq/L   CO2 22 19 - 32 mEq/L   Glucose, Bld 94 70 - 99 mg/dL   BUN 33 (H) 6 - 23 mg/dL   Creatinine, Ser 2.22 (H) 0.50 - 1.35 mg/dL   Calcium 9.2 8.4 - 10.5 mg/dL   Total Protein 6.0 6.0 - 8.3 g/dL   Albumin 1.9 (L) 3.5 - 5.2 g/dL   AST 18 0 - 37 U/L   ALT 12 0 - 53 U/L   Alkaline Phosphatase 136 (H) 39 - 117 U/L   Total Bilirubin 0.5 0.3 - 1.2 mg/dL   GFR calc non Af Amer 28 (L) >90 mL/min   GFR calc Af Amer 32 (L) >90 mL/min    Comment: (NOTE) The eGFR has been calculated using the CKD EPI equation. This calculation has not been validated in all clinical situations. eGFR's persistently <90 mL/min signify possible Chronic Kidney Disease.    Anion gap 14 5 - 15    MICRO: 11/12 blood cx ngtd 11/14 blood cx ngtd  IMAGING: Mr Tibia Fibula Right Wo Contrast  04/01/2014   CLINICAL DATA:  Leg swelling. Cellulitis. Abscess. Initial encounter.  EXAM: MRI OF THE RIGHT FOREFOOT WITHOUT CONTRAST; MRI OF LOWER RIGHT EXTREMITY  WITHOUT CONTRAST  TECHNIQUE: Multiplanar, multisequence MR imaging was performed. No intravenous contrast was administered.  COMPARISON:  No radiographs for comparison.  FINDINGS: Abnormal bone marrow signal is present in the tibia and fibula. Bone marrow signal shows heterogenous marrow. This is a nonspecific finding most commonly associated with obesity, anemia, cigarette smoking or chronic disease.  There is diffuse subcutaneous infiltration circumferentially around the RIGHT leg, most compatible with cellulitis in the appropriate clinical setting but nonspecific. Fatty atrophy of the inferior medial head of the gastrocnemius is present. This is superimposed on mild diffuse atrophy of the leg. There is no deep soft tissue abscess. Edema of the muscular compartments may represent dependent or reactive edema. Infectious myositis is considered unlikely.  Evaluation of the foot shows similar findings with more prominent edema anterior to the ankle and in the dorsum of the foot. Minimal ankle effusion. Subtalar joints appear within normal limits. The Achilles tendon, posterior medial and posterior lateral tendon groups appear intact. No findings to suggest osteomyelitis. Hindfoot bones appear within normal limits.  IMPRESSION: Diffuse edema of the leg and foot, which is nonspecific but can represent cellulitis, dependent edema or lymphedema. No osteomyelitis or abscess.   Electronically Signed   By: Dereck Ligas M.D.   On: 04/01/2014 12:45  Mr Foot Right Wo Contrast  04/01/2014   CLINICAL DATA:  Leg swelling. Cellulitis. Abscess. Initial encounter.  EXAM: MRI OF THE RIGHT FOREFOOT WITHOUT CONTRAST; MRI OF LOWER RIGHT EXTREMITY WITHOUT CONTRAST  TECHNIQUE: Multiplanar, multisequence MR imaging was performed. No intravenous contrast was administered.  COMPARISON:  No radiographs for comparison.  FINDINGS: Abnormal bone marrow signal is present in the tibia and fibula. Bone marrow signal shows heterogenous  marrow. This is a nonspecific finding most commonly associated with obesity, anemia, cigarette smoking or chronic disease.  There is diffuse subcutaneous infiltration circumferentially around the RIGHT leg, most compatible with cellulitis in the appropriate clinical setting but nonspecific. Fatty atrophy of the inferior medial head of the gastrocnemius is present. This is superimposed on mild diffuse atrophy of the leg. There is no deep soft tissue abscess. Edema of the muscular compartments may represent dependent or reactive edema. Infectious myositis is considered unlikely.  Evaluation of the foot shows similar findings with more prominent edema anterior to the ankle and in the dorsum of the foot. Minimal ankle effusion. Subtalar joints appear within normal limits. The Achilles tendon, posterior medial and posterior lateral tendon groups appear intact. No findings to suggest osteomyelitis. Hindfoot bones appear within normal limits.  IMPRESSION: Diffuse edema of the leg and foot, which is nonspecific but can represent cellulitis, dependent edema or lymphedema. No osteomyelitis or abscess.   Electronically Signed   By: Dereck Ligas M.D.   On: 04/01/2014 12:45   Nm Pulmonary Perf And Vent  03/31/2014   CLINICAL DATA:  Shortness of breath  EXAM: NUCLEAR MEDICINE VENTILATION - PERFUSION LUNG SCAN  TECHNIQUE: Ventilation images were obtained in multiple projections using inhaled aerosol technetium 99 M DTPA. Perfusion images were obtained in multiple projections after intravenous injection of Tc-34mMAA.  RADIOPHARMACEUTICALS:  40 mCi Tc-980mTPA aerosol and 6 mCi Tc-9942mA  COMPARISON:  None.  FINDINGS: Ventilation: Only limited ventilation images were obtained. They demonstrate no definitive ventilation defect. Patient could not tolerate laying on the table.  Perfusion: Limited per fusion views were obtained and demonstrate no perfusion defect. Again full imaging could not be performed due to patient's  intolerance of positioning.  IMPRESSION: Extremely limited exam although no perfusion defect to suggest pulmonary embolism is noted.   Electronically Signed   By: MarInez CatalinaD.   On: 03/31/2014 14:25   Dg Chest Port 1 View  03/31/2014   CLINICAL DATA:  73 32ar old male with shortness of breath.  EXAM: PORTABLE CHEST - 1 VIEW  COMPARISON:  03/29/2014 and prior chest radiographs dating back to 08/04/2004  FINDINGS: The cardiomediastinal silhouette is unchanged.  Pulmonary vascular congestion and probable interstitial edema again noted.  Bilateral lower lung atelectasis/airspace disease again noted.  Increased bony density is again noted.  IMPRESSION: Little interval change since prior study with probable pulmonary edema and bilateral lower lung atelectasis/ airspace disease.   Electronically Signed   By: JefHassan RowanD.   On: 03/31/2014 14:24    HISTORICAL MICRO/IMAGING  Assessment/Plan:  Cellulitis  Cellulitis, appears improving = would switch to cefazolin -renally dosed. Patient not known to have mrsa colonization. Can d/c vanco and cefepime.  Foot pain = had pain with movement of foot that radiated to hip, left. Consider mri/ct imaging of hip, looking for septic arthritis  Leukocytosis = likely related to myelofibrosis. 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 Appears somewhat intrinsic related since BUN unchanged.  Deconditioned = encourage him to work with ot  Caren Griffins B. [place] [facility] [phone]

## 2014-04-02 NOTE — Consult Note (Signed)
Pt seen by [facility] nurse on Friday by [place] nurse.  I have verified with bedside nursing staff that there are no acute changes in the patients pressure ulcer since my assessment.  I will not re-consult for this reason.   Xochilth Standish Lamar RN,CWOCN [phone]

## 2014-04-03 ENCOUNTER — Inpatient Hospital Stay (HOSPITAL_COMMUNITY): Payer: Medicare Other

## 2014-04-03 DIAGNOSIS — C9500 Acute leukemia of unspecified cell type not having achieved remission: Secondary | ICD-10-CM

## 2014-04-03 DIAGNOSIS — L03115 Cellulitis of right lower limb: Secondary | ICD-10-CM | POA: Insufficient documentation

## 2014-04-03 DIAGNOSIS — I872 Venous insufficiency (chronic) (peripheral): Secondary | ICD-10-CM | POA: Insufficient documentation

## 2014-04-03 LAB — BASIC METABOLIC PANEL
Anion gap: 14 (ref 5–15)
BUN: 39 mg/dL — AB (ref 6–23)
CO2: 21 mEq/L (ref 19–32)
Calcium: 9 mg/dL (ref 8.4–10.5)
Chloride: 102 mEq/L (ref 96–112)
Creatinine, Ser: 2.4 mg/dL — ABNORMAL HIGH (ref 0.50–1.35)
GFR calc Af Amer: 29 mL/min — ABNORMAL LOW (ref >90)
GFR calc non Af Amer: 25 mL/min — ABNORMAL LOW (ref >90)
GLUCOSE: 97 mg/dL (ref 70–99)
POTASSIUM: 4.6 meq/L (ref 3.7–5.3)
Sodium: 137 mEq/L (ref 137–147)

## 2014-04-03 LAB — CBC WITH DIFFERENTIAL/PLATELET
BASOS ABS: 0.7 10*3/uL — AB (ref 0.0–0.1)
BLASTS: 0 %
Band Neutrophils: 0 % (ref 0–10)
Basophils Relative: 2 % — ABNORMAL HIGH (ref 0–1)
Eosinophils Absolute: 0.7 10*3/uL (ref 0.0–0.7)
Eosinophils Relative: 2 % (ref 0–5)
HCT: 23.7 % — ABNORMAL LOW (ref 39.0–52.0)
Hemoglobin: 7.6 g/dL — ABNORMAL LOW (ref 13.0–17.0)
LYMPHS PCT: 16 % (ref 12–46)
Lymphs Abs: 5.2 10*3/uL — ABNORMAL HIGH (ref 0.7–4.0)
MCH: 25.9 pg — AB (ref 26.0–34.0)
MCHC: 32.1 g/dL (ref 30.0–36.0)
MCV: 80.6 fL (ref 78.0–100.0)
Metamyelocytes Relative: 0 %
Monocytes Absolute: 1.6 10*3/uL — ABNORMAL HIGH (ref 0.1–1.0)
Monocytes Relative: 5 % (ref 3–12)
Myelocytes: 0 %
NEUTROS ABS: 24.5 10*3/uL — AB (ref 1.7–7.7)
NEUTROS PCT: 75 % (ref 43–77)
NRBC: 0 /100{WBCs}
PROMYELOCYTES ABS: 0 %
Platelets: ADEQUATE 10*3/uL (ref 150–400)
RBC: 2.94 MIL/uL — ABNORMAL LOW (ref 4.22–5.81)
RDW: 19.9 % — ABNORMAL HIGH (ref 11.5–15.5)
WBC Morphology: INCREASED
WBC: 32.7 10*3/uL — ABNORMAL HIGH (ref 4.0–10.5)

## 2014-04-03 LAB — C4 COMPLEMENT: COMPLEMENT C4, BODY FLUID: 34 mg/dL (ref 10–40)

## 2014-04-03 LAB — TYPE AND SCREEN
ABO/RH(D): O POS
ANTIBODY SCREEN: NEGATIVE

## 2014-04-03 LAB — C3 COMPLEMENT: C3 COMPLEMENT: 123 mg/dL (ref 90–180)

## 2014-04-03 LAB — ABO/RH: ABO/RH(D): O POS

## 2014-04-03 NOTE — Progress Notes (Addendum)
[facility] PROGRESS NOTE  Juan [name] MRN:[id number] DOB: 10/26/1940 DOA: 03/29/2014 PCP: Jose Paz, MD  Assessment/Plan: Active Problems:   Obstructive sleep apnea   Cellulitis   Leukocytosis   Sepsis   Morbid obesity due to excess calories   Malnutrition of moderate degree .  Bilateral lower extremity cellulitis/sepsis/persistent fever Fever improving,, tachycardia improvingBlood culture no growth so far Continue with vancomycin, Zosyn MRI  Shows diffuse edema of the leg and the foot, no abscess Appreciate wound care consultation. Patient has a stage II pressure ulcer on his right buttock switched to cefazolin 11/16 -renally dosed. Patient not known to have mrsa colonization. discontinued vanco and cefepime.   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  He refused a bone marrow biopsy. The patient is non-compliant and has very poor understanding of disease process. Jakafi was recommended as a therapy. However, He failed to follow up at either Cancer [facility].  The patient is now too weak for systemic treatment and oncology does not recommend repeat BM biopsy. Oncology recommends palliative care consultation   Sinus tachycardia Was likely secondary to sepsis Patient has a history of myeloproliferative disorder VQ scan low probability  bilateral lower extremity Doppler to rule out DVT,negative  Acute on chronic renal failure Creatinine increasing despite IV fluids Renal ultrasound pending Uric acid level within normal limits Continue IV hydration Appreciate nephrology input, Dr. 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 Anagrelide discontinued on 11/15 as his platelet counts were 179 as of 11/15 Will continue to monitor.  History of CAD  - Reviewed outpatient cardiology notes-had a normal Myoview in 2007.  - Stable without any chest pain or shortness of breath  We'll cycle cardiac enzymes in the setting of sepsis  Chronic lower extremity edema, stasis dermatitis  - Chronic issue-as noted above-current exam consistent with chronic stasis dermatitis changes.  Resume Lasix when able  Malnutrition The patient has been reporting failure to thrive, and subsequent protein calorie malnutrition, and a 20 pound weight loss over the last 4 weeks. Recommend nutritional evaluation while in the hospital  Code Status: full Family Communication: family updated about patient's clinical progress Disposition Plan:  Continue in step down unit, palliative care consultation requested   Brief narrative: Juan [name] is a 73 y.o. male  Who presents from home, he lives with his son. Patient has received 8 mg of IV morphine and is fairly lethargic. He is able to be aroused but unable to provide any meaningful history. History obtained from ER chart. The patient has chronic lower extremity edema right greater than left.for the last 1 months patient's and having increasing swelling and pain in his right foot and leg. Pain is so terrible that patient has been unable to bear weight. Patient does admit to   chills but denies fever. In the ER, patient was noticed to  be Desatting while sleeping into the 80s.the patient was found to have an increased lactic acid as well as an increased white blood cell count. His right leg was warm swollen and tender appearing as a cellulitis.of note patient had a lower sugar many ultrasound done in September 2015 negative for DVTs. There was noted to be a lot of swelling on the ultrasound. Upon further chart review. As it appears the patient was also admitted in September 2015 for a questionable right lower extremity cellulitis. The cellulitis improved on IV antibiotics. Patient is currently febrile.per ER records appears he vomited 1.   Consultants:  none  Procedures:  none  Antibiotics:  Vancomycin/Zosyn 11/16  Cefazolin 11/16  HPI/Subjective: Similar complaints of a daily basis about heartburn, indigestion, generalized body ache  Objective: Filed Vitals:   04/03/14 0032 04/03/14 0500 04/03/14 0738 04/03/14 1139  BP:   112/69 110/61  Pulse:   98 76  Temp: 97.4 F (36.3 C) 98.7 F (37.1 C) 98 F (36.7 C) 98.2 F (36.8 C)  TempSrc: Oral Oral Oral Oral  Resp:   21 23  Height:      Weight:      SpO2:   98% 98%    Intake/Output Summary (Last 24 hours) at 04/03/14 1157 Last data filed at 04/03/14 1000  Gross per 24 hour  Intake 1054.17 ml  Output   1000 ml  Net  54.17 ml    Exam:  General: alert & oriented x 3 In NAD  Cardiovascular: RRR, nl S1 s2  Respiratory: Decreased breath sounds at the bases, scattered rhonchi, no crackles  Abdomen: soft +BS NT/ND, no masses palpable  Extremities: bilateral lower extremity edema and erythema, right greater than left      Data Reviewed: Basic Metabolic Panel:  Recent Labs Lab 03/30/14 0305 03/31/14 0350 04/01/14 0230 04/02/14 0226 04/03/14 0257  NA 141 138 139 141 137  K 4.6 4.2 4.3 4.7 4.6  CL 106 103 104 105 102  CO2 _0 GLUCOSE 109* 112* 131* 94 97  BUN 34* 29* 30* 33* 39*  CREATININE 1.44* 1.64* 1.89* 2.22* 2.40*   CALCIUM 9.1 8.9 9.1 9.2 9.0    Liver Function Tests:  Recent Labs Lab 03/31/14 0350 04/01/14 0230 04/02/14 0226  AST _1 ALT _2 ALKPHOS 101 122* 136*  BILITOT 0.5 0.5 0.5  PROT 5.8* 6.1 6.0  ALBUMIN 2.2* 2.0* 1.9*   No results for input(s): LIPASE, AMYLASE in the last 168 hours. No results for input(s): AMMONIA in the last 168 hours.  CBC:  Recent Labs Lab 03/29/14 1549  03/31/14 0350 04/01/14 0230 04/01/14 1420 04/02/14 0226 04/03/14 0257  WBC 17.8*  < > 20.8* 24.4* 27.2* 28.8* 32.7*  NEUTROABS 9.8*  --   --   --  18.8* 19.8* 24.5*  HGB 10.3*  < > 8.5* 8.9* 9.1* 9.1* 7.6*  HCT 31.9*  < > 27.3* 27.8* 28.9* 29.5* 23.7*  MCV 80.2  < > 80.5 80.8 81.0 81.9 80.6  PLT 292  < > PLATELET CLUMPS NOTED ON SMEAR, COUNT APPEARS ADEQUATE 196 179 PLATELET CLUMPS NOTED ON SMEAR, COUNT APPEARS ADEQUATE PLATELET CLUMPS NOTED ON SMEAR, COUNT APPEARS ADEQUATE  < > = values in this interval not displayed.  Cardiac Enzymes:  Recent Labs Lab 03/29/14 1549 04/01/14 0855 04/01/14 1420 04/01/14 1945  TROPONINI <0.30 <0.30 <0.30 <0.30  BNP (last 3 results)  Recent Labs  12/09/13 0751 01/25/14 1115 03/29/14 1549  PROBNP 5630.0* 828.2* 6628.0*     CBG: No results for input(s): GLUCAP in the last 168 hours.  Recent Results (from the past 240 hour(s))  Blood culture (routine x 2)     Status: None (Preliminary result)   Collection Time: 03/29/14  3:48 PM  Result Value Ref Range Status   Specimen Description BLOOD ARM RIGHT  Final   Special Requests BOTTLES DRAWN AEROBIC AND ANAEROBIC 5CC  Final   Culture  Setup Time   Final    03/30/2014 01:30 Performed at [facility]    Culture   Final           BLOOD CULTURE RECEIVED NO GROWTH TO DATE CULTURE WILL BE HELD FOR 5 DAYS BEFORE ISSUING A FINAL NEGATIVE REPORT Performed at [facility]    Report Status PENDING  Incomplete  Blood culture (routine x 2)     Status: None (Preliminary result)    Collection Time: 03/29/14  5:39 PM  Result Value Ref Range Status   Specimen Description BLOOD RIGHT ARM  Final   Special Requests BOTTLES DRAWN AEROBIC AND ANAEROBIC 5CC  Final   Culture  Setup Time   Final    03/30/2014 01:30 Performed at [facility]    Culture   Final           BLOOD CULTURE RECEIVED NO GROWTH TO DATE CULTURE WILL BE HELD FOR 5 DAYS BEFORE ISSUING A FINAL NEGATIVE REPORT Performed at [facility]    Report Status PENDING  Incomplete  MRSA PCR Screening     Status: None   Collection Time: 03/29/14  9:25 PM  Result Value Ref Range Status   MRSA by PCR NEGATIVE NEGATIVE Final    Comment:        The GeneXpert MRSA Assay (FDA approved for NASAL specimens only), is one component of a comprehensive MRSA colonization surveillance program. It is not intended to diagnose MRSA infection nor to guide or monitor treatment for MRSA infections.   Culture, blood (routine x 2)     Status: None (Preliminary result)   Collection Time: 03/31/14  6:36 PM  Result Value Ref Range Status   Specimen Description BLOOD LEFT HAND  Final   Special Requests BOTTLES DRAWN AEROBIC AND ANAEROBIC 5CC  Final   Culture  Setup Time   Final    04/01/2014 01:38 Performed at [facility]    Culture   Final           BLOOD CULTURE RECEIVED NO GROWTH TO DATE CULTURE WILL BE HELD FOR 5 DAYS BEFORE ISSUING A FINAL NEGATIVE REPORT Performed at [facility]    Report Status PENDING  Incomplete  Culture, blood (routine x 2)     Status: None (Preliminary result)   Collection Time: 03/31/14  6:41 PM  Result Value Ref Range Status   Specimen Description BLOOD RIGHT ARM  Final   Special Requests BOTTLES DRAWN AEROBIC AND ANAEROBIC 5CC  Final   Culture  Setup Time   Final    04/01/2014 01:38 Performed at [facility]    Culture   Final           BLOOD CULTURE RECEIVED NO GROWTH TO DATE CULTURE WILL BE HELD FOR 5 DAYS BEFORE ISSUING A FINAL NEGATIVE  REPORT Performed at [facility]    Report Status PENDING  Incomplete       Studies: Mr Tibia Fibula Right Wo Contrast  04/01/2014   CLINICAL DATA:  Leg swelling. Cellulitis. Abscess. Initial encounter.  EXAM: MRI OF THE RIGHT FOREFOOT WITHOUT CONTRAST; MRI OF LOWER RIGHT EXTREMITY WITHOUT CONTRAST  TECHNIQUE: Multiplanar, multisequence MR imaging was performed. No intravenous contrast was administered.  COMPARISON:  No radiographs for comparison.  FINDINGS: Abnormal bone marrow signal is present in the tibia and fibula. Bone marrow signal shows heterogenous marrow. This is a nonspecific finding most commonly associated with obesity, anemia, cigarette smoking or chronic disease.  There is diffuse subcutaneous infiltration circumferentially around the RIGHT leg, most compatible with cellulitis in the appropriate clinical setting but nonspecific. Fatty atrophy of the inferior medial head of the gastrocnemius is present. This is superimposed on mild diffuse atrophy of the leg. There is no deep soft tissue abscess. Edema of the muscular compartments may represent dependent or reactive edema. Infectious myositis is considered unlikely.  Evaluation of the foot shows similar findings with more prominent edema anterior to the ankle and in the dorsum of the foot. Minimal ankle effusion. Subtalar joints appear within normal limits. The Achilles tendon, posterior medial and posterior lateral tendon groups appear intact. No findings to suggest osteomyelitis. Hindfoot bones appear within normal limits.  IMPRESSION: Diffuse edema of the leg and foot, which is nonspecific but can represent cellulitis, dependent edema or lymphedema. No osteomyelitis or abscess.   Electronically Signed   By: Geoffrey  Lamke M.D.   On: 04/01/2014 12:45   Mr Foot Right Wo Contrast  04/01/2014   CLINICAL DATA:  Leg swelling. Cellulitis. Abscess. Initial encounter.  EXAM: MRI OF THE RIGHT FOREFOOT WITHOUT CONTRAST; MRI OF LOWER RIGHT  EXTREMITY WITHOUT CONTRAST  TECHNIQUE: Multiplanar, multisequence MR imaging was performed. No intravenous contrast was administered.  COMPARISON:  No radiographs for comparison.  FINDINGS: Abnormal bone marrow signal is present in the tibia and fibula. Bone marrow signal shows heterogenous marrow. This is a nonspecific finding most commonly associated with obesity, anemia, cigarette smoking or chronic disease.  There is diffuse subcutaneous infiltration circumferentially around the RIGHT leg, most compatible with cellulitis in the appropriate clinical setting but nonspecific. Fatty atrophy of the inferior medial head of the gastrocnemius is present. This is superimposed on mild diffuse atrophy of the leg. There is no deep soft tissue abscess. Edema of the muscular compartments may represent dependent or reactive edema. Infectious myositis is considered unlikely.  Evaluation of the foot shows similar findings with more prominent edema anterior to the ankle and in the dorsum of the foot. Minimal ankle effusion. Subtalar joints appear within normal limits. The Achilles tendon, posterior medial and posterior lateral tendon groups appear intact. No findings to suggest osteomyelitis. Hindfoot bones appear within normal limits.  IMPRESSION: Diffuse edema of the leg and foot, which is nonspecific but can represent cellulitis, dependent edema or lymphedema. No osteomyelitis or abscess.   Electronically Signed   By: Geoffrey  Lamke M.D.   On: 04/01/2014 12:45   Nm Pulmonary Perf And Vent  03/31/2014   CLINICAL DATA:  Shortness of breath  EXAM: NUCLEAR MEDICINE VENTILATION - PERFUSION LUNG SCAN  TECHNIQUE: Ventilation images were obtained in multiple projections using inhaled aerosol technetium 99 M DTPA. Perfusion images were obtained in multiple projections after intravenous injection of Tc-99m MAA.  RADIOPHARMACEUTICALS:  40 mCi Tc-99m DTPA aerosol and 6 mCi Tc-99m MAA  COMPARISON:  None.  FINDINGS: Ventilation: Only  limited ventilation images were obtained. They demonstrate no definitive ventilation defect. Patient could not tolerate laying   on the table.  Perfusion: Limited per fusion views were obtained and demonstrate no perfusion defect. Again full imaging could not be performed due to patient's intolerance of positioning.  IMPRESSION: Extremely limited exam although no perfusion defect to suggest pulmonary embolism is noted.   Electronically Signed   By: Mark  Lukens M.D.   On: 03/31/2014 14:25   Dg Chest Port 1 View  03/31/2014   CLINICAL DATA:  73-year-old male with shortness of breath.  EXAM: PORTABLE CHEST - 1 VIEW  COMPARISON:  03/29/2014 and prior chest radiographs dating back to 08/04/2004  FINDINGS: The cardiomediastinal silhouette is unchanged.  Pulmonary vascular congestion and probable interstitial edema again noted.  Bilateral lower lung atelectasis/airspace disease again noted.  Increased bony density is again noted.  IMPRESSION: Little interval change since prior study with probable pulmonary edema and bilateral lower lung atelectasis/ airspace disease.   Electronically Signed   By: Jeff  Hu M.D.   On: 03/31/2014 14:24   Dg Chest Portable 1 View  03/29/2014   CLINICAL DATA:  Cellulitis. Cough and congestion. Shortness of breath. Chest pain. Nausea, vomiting, fever. History of hypertension and smoking.  EXAM: PORTABLE CHEST - 1 VIEW  COMPARISON:  01/26/2014  FINDINGS: Shallow inflation. Heart is enlarged. Mild pulmonary edema. There are no focal consolidations. Although not well evaluated on this portable view of the chest, the bones have a somewhat mottled lucent appearance raising the question of metabolic or metastatic disorder.  IMPRESSION: 1. Cardiomegaly and pulmonary edema. 2. Question of diffuse osseous changes. Recommend correlation with history and lab values. Consider further imaging if needed.   Electronically Signed   By: Beth  Brown M.D.   On: 03/29/2014 18:01    Scheduled Meds: .   ceFAZolin (ANCEF) IV  1 g Intravenous 3 times per day  . feeding supplement (GLUCERNA SHAKE)  237 mL Oral BID BM  . feeding supplement (PRO-STAT SUGAR FREE 64)  30 mL Oral BID BM  . heparin  5,000 Units Subcutaneous 3 times per day  . hydrocerin   Topical Daily  . pantoprazole  40 mg Oral Daily  . sodium chloride  3 mL Intravenous Q12H   Continuous Infusions: . sodium chloride 10 mL/hr at 04/03/14 1000    Active Problems:   Obstructive sleep apnea   Cellulitis   Leukocytosis   Sepsis   Morbid obesity due to excess calories   Malnutrition of moderate degree    Time spent: 40 minutes   ,  [facility] Pager [phone]. If 7PM-7AM, please contact night-coverage at [email or web address], password [email or web address] 04/03/2014, 11:57 AM  LOS: 5 days          

## 2014-04-03 NOTE — Progress Notes (Signed)
I went to visit [name] twice today but he was sleeping and did not want to awaken to speak with me and agreed I could come back after lunch. When I came back this afternoon he was not at all happy and determined to get up to the chair - this was not a good time to engage with him as he appeared agitated. We will try again as soon as we can. Thank you for this consult.   Vinie Sill, NP Palliative Medicine Team Pager # [phone] (M-F 8a-5p) Team Phone # [phone] (Nights/Weekends)

## 2014-04-03 NOTE — Progress Notes (Signed)
[facility] for Infectious Disease    Date of Admission:  03/29/2014   Total days of antibiotics 6        Day 2 cefazolin           ID: Juan [name] is a 73 y.o. 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  ceFAZolin (ANCEF) IV  1 g Intravenous 3 times per day  . feeding supplement (GLUCERNA SHAKE)  237 mL Oral BID BM  . feeding supplement (PRO-STAT SUGAR FREE 64)  30 mL Oral BID BM  . heparin  5,000 Units Subcutaneous 3 times per day  . hydrocerin   Topical Daily  . pantoprazole  40 mg Oral Daily  . sodium chloride  3 mL Intravenous Q12H    Objective: Vital signs in last 24 hours: Temp:  [97.4 F (36.3 C)-98.8 F (37.1 C)] 98 F (36.7 C) (11/17 0738) Pulse Rate:  [98-123] 98 (11/17 0738) Resp:  [17-25] 21 (11/17 0738) BP: (112-135)/(61-104) 112/69 mmHg (11/17 0738) SpO2:  [93 %-100 %] 98 % (11/17 0738) Physical Exam  Constitutional: He is oriented to person, place, and time. He appears well-developed and well-nourished. No distress.  HENT:  Mouth/Throat: Oropharynx is clear and moist. No oropharyngeal exudate.  Cardiovascular: Normal rate, regular rhythm and normal heart sounds. Exam reveals no gallop and no friction rub.  No murmur heard.  Pulmonary/Chest: Effort normal and breath sounds normal. No respiratory distress. He has no wheezes.  Abdominal: Soft. Bowel sounds are normal. He exhibits no distension. There is no tenderness.  Lymphadenopathy:  He has no cervical adenopathy.  Neurological: He is alert and oriented to person, place, and time.  Skin: Skin is warm and dry. No rash noted. No erythema.  Psychiatric: He has a normal mood and affect. His behavior is normal.    Lab  Results  Recent Labs  04/02/14 0226 04/03/14 0257  WBC 28.8* 32.7*  HGB 9.1* 7.6*  HCT 29.5* 23.7*  NA 141 137  K 4.7 4.6  CL 105 102  CO2 22 21  BUN 33* 39*  CREATININE 2.22* 2.40*   Liver Panel  Recent Labs  04/01/14 0230 04/02/14 0226  PROT 6.1 6.0  ALBUMIN 2.0* 1.9*  AST 22 18  ALT 12 12  ALKPHOS 122* 136*  BILITOT 0.5 0.5   Microbiology: 11/14 blood cx and 11/12 blood cx ngtd Studies/Results: Mr Tibia Fibula Right Wo Contrast  04/01/2014   CLINICAL DATA:  Leg swelling. Cellulitis. Abscess. Initial encounter.  EXAM: MRI OF THE RIGHT FOREFOOT WITHOUT CONTRAST; MRI OF LOWER RIGHT EXTREMITY WITHOUT CONTRAST  TECHNIQUE: Multiplanar, multisequence MR imaging was performed. No intravenous contrast was administered.  COMPARISON:  No radiographs for comparison.  FINDINGS: Abnormal bone marrow signal is present in the tibia and fibula. Bone marrow signal shows heterogenous marrow. This is a nonspecific finding most commonly associated with obesity, anemia, cigarette smoking or chronic disease.  There is diffuse subcutaneous infiltration circumferentially around the RIGHT leg, most compatible with cellulitis in the appropriate clinical setting but nonspecific. Fatty atrophy of the inferior medial head of the gastrocnemius is present. This is superimposed on mild diffuse atrophy of the leg. There is no deep soft tissue abscess.  Edema of the muscular compartments may represent dependent or reactive edema. Infectious myositis is considered unlikely.  Evaluation of the foot shows similar findings with more prominent edema anterior to the ankle and in the dorsum of the foot. Minimal ankle effusion. Subtalar joints appear within normal limits. The Achilles tendon, posterior medial and posterior lateral tendon groups appear intact. No findings to suggest osteomyelitis. Hindfoot bones appear within normal limits.  IMPRESSION: Diffuse edema of the leg and foot, which is nonspecific but can  represent cellulitis, dependent edema or lymphedema. No osteomyelitis or abscess.   Electronically Signed   By: Dereck Ligas M.D.   On: 04/01/2014 12:45   Mr Foot Right Wo Contrast  04/01/2014   CLINICAL DATA:  Leg swelling. Cellulitis. Abscess. Initial encounter.  EXAM: MRI OF THE RIGHT FOREFOOT WITHOUT CONTRAST; MRI OF LOWER RIGHT EXTREMITY WITHOUT CONTRAST  TECHNIQUE: Multiplanar, multisequence MR imaging was performed. No intravenous contrast was administered.  COMPARISON:  No radiographs for comparison.  FINDINGS: Abnormal bone marrow signal is present in the tibia and fibula. Bone marrow signal shows heterogenous marrow. This is a nonspecific finding most commonly associated with obesity, anemia, cigarette smoking or chronic disease.  There is diffuse subcutaneous infiltration circumferentially around the RIGHT leg, most compatible with cellulitis in the appropriate clinical setting but nonspecific. Fatty atrophy of the inferior medial head of the gastrocnemius is present. This is superimposed on mild diffuse atrophy of the leg. There is no deep soft tissue abscess. Edema of the muscular compartments may represent dependent or reactive edema. Infectious myositis is considered unlikely.  Evaluation of the foot shows similar findings with more prominent edema anterior to the ankle and in the dorsum of the foot. Minimal ankle effusion. Subtalar joints appear within normal limits. The Achilles tendon, posterior medial and posterior lateral tendon groups appear intact. No findings to suggest osteomyelitis. Hindfoot bones appear within normal limits.  IMPRESSION: Diffuse edema of the leg and foot, which is nonspecific but can represent cellulitis, dependent edema or lymphedema. No osteomyelitis or abscess.   Electronically Signed   By: Dereck Ligas M.D.   On: 04/01/2014 12:45     Assessment/Plan: Cellulitis = if remains afebrile today, would switch him to keflex renally dosed for a total antibiotic  course of 10 days including IV therapy. He is on day 6 of 10. 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

## 2014-04-03 NOTE — Progress Notes (Signed)
Patient ID: Juan [name], male   DOB: 1940-06-19, 73 y.o.   MRN: [id number]  [facility] Progress Note    Assessment/ Plan:   1. 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 There was no clear indication for prerenal azotemia but surprisingly fractional excretion of sodium low. Renal ultrasound is pending at this time. Urine output remains nonoliguric and renal function slightly worse which would indicate probable approach of the plateau phase of ATN. 2. Bilateral lower extremity cellulitis with associated sepsis: On broad-spectrum empiric antibiotic therapy, he remains tachycardic but blood pressure appears to be improving. 3. Anemia/leukocytosis:with prior history of myelodysplastic syndrome that he did not complete follow-up for, reevaluation undertaken by hematology- the patient refused further evaluation and treatment and a palliative care consult/hospice referral has been recommended. Hematology have signed off. 4. Sinus tachycardia: is appears to be related to infection/sepsis, VQ scan negative for PE  Subjective:   Reports to be feeling fair-events from overnight noted   Objective:   BP 112/69 mmHg  Pulse 98  Temp(Src) 98 F (36.7 C) (Oral)  Resp 21  Ht 6\' 2"  (1.88 m)  Wt 118.5 kg (261 lb 3.9 oz)  BMI 33.53 kg/m2  SpO2 98%  Intake/Output Summary (Last 24 hours) at 04/03/14 0844 Last data filed at 04/03/14 0600  Gross per 24 hour  Intake 964.17 ml  Output   1200 ml  Net -235.83 ml   Weight change:   Physical Exam: BTD:VVOHYWVPXTG resting in bed, eating breakfast GYI:RSWNI regular tachycardia, S1 and S2 with ESM Resp:fine rales left base otherwise clear OEV:OJJK, obese, nontender Ext:3+ lower extremity edema/improving cellulitis  Imaging: Mr Tibia Fibula Right Wo Contrast  04/01/2014   CLINICAL DATA:  Leg swelling. Cellulitis. Abscess. Initial encounter.   EXAM: MRI OF THE RIGHT FOREFOOT WITHOUT CONTRAST; MRI OF LOWER RIGHT EXTREMITY WITHOUT CONTRAST  TECHNIQUE: Multiplanar, multisequence MR imaging was performed. No intravenous contrast was administered.  COMPARISON:  No radiographs for comparison.  FINDINGS: Abnormal bone marrow signal is present in the tibia and fibula. Bone marrow signal shows heterogenous marrow. This is a nonspecific finding most commonly associated with obesity, anemia, cigarette smoking or chronic disease.  There is diffuse subcutaneous infiltration circumferentially around the RIGHT leg, most compatible with cellulitis in the appropriate clinical setting but nonspecific. Fatty atrophy of the inferior medial head of the gastrocnemius is present. This is superimposed on mild diffuse atrophy of the leg. There is no deep soft tissue abscess. Edema of the muscular compartments may represent dependent or reactive edema. Infectious myositis is considered unlikely.  Evaluation of the foot shows similar findings with more prominent edema anterior to the ankle and in the dorsum of the foot. Minimal ankle effusion. Subtalar joints appear within normal limits. The Achilles tendon, posterior medial and posterior lateral tendon groups appear intact. No findings to suggest osteomyelitis. Hindfoot bones appear within normal limits.  IMPRESSION: Diffuse edema of the leg and foot, which is nonspecific but can represent cellulitis, dependent edema or lymphedema. No osteomyelitis or abscess.   Electronically Signed   By: Dereck Ligas M.D.   On: 04/01/2014 12:45   Mr Foot Right Wo Contrast  04/01/2014   CLINICAL DATA:  Leg swelling. Cellulitis. Abscess. Initial encounter.  EXAM: MRI OF THE RIGHT FOREFOOT WITHOUT CONTRAST; MRI OF LOWER RIGHT EXTREMITY WITHOUT CONTRAST  TECHNIQUE: Multiplanar, multisequence MR imaging was performed. No intravenous contrast was administered.  COMPARISON:  No radiographs  for comparison.  FINDINGS: Abnormal bone marrow signal  is present in the tibia and fibula. Bone marrow signal shows heterogenous marrow. This is a nonspecific finding most commonly associated with obesity, anemia, cigarette smoking or chronic disease.  There is diffuse subcutaneous infiltration circumferentially around the RIGHT leg, most compatible with cellulitis in the appropriate clinical setting but nonspecific. Fatty atrophy of the inferior medial head of the gastrocnemius is present. This is superimposed on mild diffuse atrophy of the leg. There is no deep soft tissue abscess. Edema of the muscular compartments may represent dependent or reactive edema. Infectious myositis is considered unlikely.  Evaluation of the foot shows similar findings with more prominent edema anterior to the ankle and in the dorsum of the foot. Minimal ankle effusion. Subtalar joints appear within normal limits. The Achilles tendon, posterior medial and posterior lateral tendon groups appear intact. No findings to suggest osteomyelitis. Hindfoot bones appear within normal limits.  IMPRESSION: Diffuse edema of the leg and foot, which is nonspecific but can represent cellulitis, dependent edema or lymphedema. No osteomyelitis or abscess.   Electronically Signed   By: Dereck Ligas M.D.   On: 04/01/2014 12:45    Labs: BMET  Recent Labs Lab 03/29/14 1549 03/30/14 0305 03/31/14 0350 04/01/14 0230 04/02/14 0226 04/03/14 0257  NA 139 141 138 139 141 137  K 3.8 4.6 4.2 4.3 4.7 4.6  CL 101 106 103 104 105 102  CO2 20 21 21 21 22 21   GLUCOSE 118* 109* 112* 131* 94 97  BUN 34* 34* 29* 30* 33* 39*  CREATININE 1.32 1.44* 1.64* 1.89* 2.22* 2.40*  CALCIUM 10.1 9.1 8.9 9.1 9.2 9.0   CBC  Recent Labs Lab 03/29/14 1549  04/01/14 0230 04/01/14 1420 04/02/14 0226 04/03/14 0257  WBC 17.8*  < > 24.4* 27.2* 28.8* 32.7*  NEUTROABS 9.8*  --   --  18.8* 19.8* 24.5*  HGB 10.3*  < > 8.9* 9.1* 9.1* 7.6*  HCT 31.9*  < > 27.8* 28.9* 29.5* 23.7*  MCV 80.2  < > 80.8 81.0 81.9 80.6   PLT 292  < > 196 179 PLATELET CLUMPS NOTED ON SMEAR, COUNT APPEARS ADEQUATE PLATELET CLUMPS NOTED ON SMEAR, COUNT APPEARS ADEQUATE  < > = values in this interval not displayed.  Medications:    .  ceFAZolin (ANCEF) IV  1 g Intravenous 3 times per day  . feeding supplement (GLUCERNA SHAKE)  237 mL Oral BID BM  . feeding supplement (PRO-STAT SUGAR FREE 64)  30 mL Oral BID BM  . heparin  5,000 Units Subcutaneous 3 times per day  . hydrocerin   Topical Daily  . pantoprazole  40 mg Oral Daily  . sodium chloride  3 mL Intravenous Q12H   Elmarie Shiley, MD 04/03/2014, 8:44 AM

## 2014-04-04 LAB — CBC WITH DIFFERENTIAL/PLATELET
BASOS ABS: 0 10*3/uL (ref 0.0–0.1)
Band Neutrophils: 29 % — ABNORMAL HIGH (ref 0–10)
Basophils Relative: 0 % (ref 0–1)
Blasts: 2 %
EOS ABS: 0.3 10*3/uL (ref 0.0–0.7)
EOS PCT: 1 % (ref 0–5)
HCT: 25.5 % — ABNORMAL LOW (ref 39.0–52.0)
Hemoglobin: 8.1 g/dL — ABNORMAL LOW (ref 13.0–17.0)
LYMPHS ABS: 3.1 10*3/uL (ref 0.7–4.0)
Lymphocytes Relative: 9 % — ABNORMAL LOW (ref 12–46)
MCH: 25.5 pg — ABNORMAL LOW (ref 26.0–34.0)
MCHC: 31.8 g/dL (ref 30.0–36.0)
MCV: 80.2 fL (ref 78.0–100.0)
METAMYELOCYTES PCT: 5 %
MONOS PCT: 3 % (ref 3–12)
Monocytes Absolute: 1 10*3/uL (ref 0.1–1.0)
Myelocytes: 6 %
NEUTROS PCT: 44 % (ref 43–77)
Neutro Abs: 28.9 10*3/uL — ABNORMAL HIGH (ref 1.7–7.7)
PLATELETS: 172 10*3/uL (ref 150–400)
Promyelocytes Absolute: 1 %
RBC: 3.18 MIL/uL — ABNORMAL LOW (ref 4.22–5.81)
RDW: 19.7 % — ABNORMAL HIGH (ref 11.5–15.5)
WBC Morphology: INCREASED
WBC: 34 10*3/uL — ABNORMAL HIGH (ref 4.0–10.5)

## 2014-04-04 LAB — BASIC METABOLIC PANEL
Anion gap: 13 (ref 5–15)
BUN: 43 mg/dL — AB (ref 6–23)
CALCIUM: 9.1 mg/dL (ref 8.4–10.5)
CO2: 20 mEq/L (ref 19–32)
Chloride: 100 mEq/L (ref 96–112)
Creatinine, Ser: 2.44 mg/dL — ABNORMAL HIGH (ref 0.50–1.35)
GFR calc Af Amer: 29 mL/min — ABNORMAL LOW (ref >90)
GFR, EST NON AFRICAN AMERICAN: 25 mL/min — AB (ref >90)
GLUCOSE: 98 mg/dL (ref 70–99)
Potassium: 4.8 mEq/L (ref 3.7–5.3)
Sodium: 133 mEq/L — ABNORMAL LOW (ref 137–147)

## 2014-04-04 MED ORDER — FUROSEMIDE 10 MG/ML IJ SOLN
40.0000 mg | Freq: Once | INTRAMUSCULAR | Status: AC
  Administered 2014-04-04: 40 mg via INTRAVENOUS
  Filled 2014-04-04: qty 4

## 2014-04-04 NOTE — Progress Notes (Signed)
Patient ID: Juan [name], male   DOB: 05-26-1940, 73 y.o.   MRN: [id number]  [facility] Progress Note   Assessment/ Plan:   1. 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 Low FeNa. Urine output not as impressive overnight and currently charted in the oliguric range (?accuracy)  but renal function rather unchanged ad indicates probable approach of the plateau phase of ATN. Will give 1 dose of IV lasix today 2. Bilateral lower extremity cellulitis with associated sepsis: On broad-spectrum empiric antibiotic therapy, he remains tachycardic but blood pressure appears to be improving. 3. Anemia/leukocytosis:with prior history of myelodysplastic syndrome that he did not complete follow-up for, reevaluation undertaken by hematology- the patient refused further evaluation and treatment and a palliative care consult/hospice referral has been recommended. Hematology have signed off. 4. Sinus tachycardia: is appears to be related to infection/sepsis, VQ scan negative for PE  Subjective:   Reports to be feeling somewhat better today "I was pooped out yesterday"   Objective:   BP 136/68 mmHg  Pulse 119  Temp(Src) 98.1 F (36.7 C) (Oral)  Resp 15  Ht 6\' 2"  (1.88 m)  Wt 118.5 kg (261 lb 3.9 oz)  BMI 33.53 kg/m2  SpO2 93%  Intake/Output Summary (Last 24 hours) at 04/04/14 0850 Last data filed at 04/04/14 0800  Gross per 24 hour  Intake    630 ml  Output    250 ml  Net    380 ml   Weight change:   Physical Exam: CWC:[id number] in bed, eating breakfast HYW:VPXTG regular tachycardia, normal S1 and s2 Resp:CTA bilaterally, no rales GYI:RSWN, obese, NT Ext:2+ LE edema  Imaging: US Renal Port  04/03/2014   CLINICAL DATA:  Renal failure, personal history of coronary artery disease, hypertension, CHF, type 2 diabetes, acute leukemia  EXAM: RENAL/URINARY TRACT ULTRASOUND COMPLETE   COMPARISON:  None  FINDINGS: Right Kidney:  Length: 12.8 cm. Cortical thinning. Upper cortical echogenicity. Hypoechoic nodule at inferior pole question minimally complicated cyst versus simple cyst with artifacts. No additional mass, hydronephrosis or shadowing calcification.  Left Kidney:  Length: 11.6 cm. Cortical thinning. Increased cortical echogenicity. No mass, hydronephrosis or shadowing calcification.  Bladder:  Large fluid containing structure with loculations in pelvis, suspect representing a large bladder with diverticula and trabeculations though cystic pelvic mass not completely excluded. Patient reportedly has a Foley catheter but this is not visualized.  Incidentally noted splenomegaly, 21.8 cm length with calculated volume 1260 mL.  IMPRESSION: Atrophic kidneys with question cyst mid inferior pole RIGHT kidney, uncertain if complicated versus simple with artifacts.  No evidence of hydronephrosis.  Large anechoic structure with multiple loculations in pelvis suspect representing a distended urinary bladder with multiple diverticula and bladder wall trabeculations though a cystic pelvic mass not entirely excluded since the patient's indwelling Foley catheter is not localized ; noncontrast CT of the pelvis recommended to assess.  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   Electronically Signed   By: Lavonia Dana M.D.   On: 04/03/2014 19:59    Labs: BMET  Recent Labs Lab 03/29/14 1549 03/30/14 0305 03/31/14 0350 04/01/14 0230 04/02/14 0226 04/03/14 0257 04/04/14 0317  NA 139 141 138 139 141 137 133*  K 3.8 4.6 4.2 4.3 4.7 4.6 4.8  CL 101 106 103 104 105 102 100  CO2 20 21 21 21 22 21 20   GLUCOSE 118* 109* 112* 131* 94 97 98  BUN 34*  34* 29* 30* 33* 39* 43*  CREATININE 1.32 1.44* 1.64* 1.89* 2.22* 2.40* 2.44*  CALCIUM 10.1 9.1 8.9 9.1 9.2 9.0 9.1   CBC  Recent Labs Lab 04/01/14 1420 04/02/14 0226 04/03/14 0257 04/04/14 0317  WBC 27.2* 28.8* 32.7* 34.0*   NEUTROABS 18.8* 19.8* 24.5* 28.9*  HGB 9.1* 9.1* 7.6* 8.1*  HCT 28.9* 29.5* 23.7* 25.5*  MCV 81.0 81.9 80.6 80.2  PLT 179 PLATELET CLUMPS NOTED ON SMEAR, COUNT APPEARS ADEQUATE PLATELET CLUMPS NOTED ON SMEAR, COUNT APPEARS ADEQUATE 172    Medications:    .  ceFAZolin (ANCEF) IV  1 g Intravenous 3 times per day  . feeding supplement (GLUCERNA SHAKE)  237 mL Oral BID BM  . feeding supplement (PRO-STAT SUGAR FREE 64)  30 mL Oral BID BM  . heparin  5,000 Units Subcutaneous 3 times per day  . hydrocerin   Topical Daily  . pantoprazole  40 mg Oral Daily  . sodium chloride  3 mL Intravenous Q12H   Elmarie Shiley, MD 04/04/2014, 8:50 AM

## 2014-04-04 NOTE — Progress Notes (Signed)
NUTRITION FOLLOW UP  Pt meets criteria for moderate MALNUTRITION in the context of chronic illness as evidenced by <75% estimated nutritional intake x 1 month, mild fat and muscle depeltion.  Intervention:   -Continue Glucerna Shake po BID, each supplement provides 220 kcal and 10 grams of protein -Continue 30 ml Prostat BID, each supplement provides 100 kcals and 15 grams protein   Nutrition Dx:   Increased nutrient needs related to wound healing as evidenced by stage II pressure ulcer on sacrum, estimated nutritional needs.   Goal:   Pt will meet >90% of estimated nutritional needs  Monitor:   PO intake, labs, weight changes, I/O's  Assessment:   Juan [name] is a 73 y.o. male who presents from home, he lives with his son. Patient has received 8 mg of IV morphine and is fairly lethargic. He is able to be aroused but unable to provide any meaningful history. History obtained from ER chart. The patient has chronic lower extremity edema right greater than left.for the last 1 months patient's and having increasing swelling and pain in his right foot and leg. Pain is so terrible that patient has been unable to bear weight.  Intake remains stable- noted 50-60% of meal completion. Pt was initially refusing supplements, however, has been taking the past few days according to [facility].  Oncology has evaluated due to recurrent leukocytosis- suspect leukemia, however, pt is refusing further work-up. Palliative care consult is pending.  Labs reviewed. Na: 133, BUN/Creat: 43/2.44. Glucose WDL.   Height: Ht Readings from Last 1 Encounters:  03/29/14 6\' 2"  (1.88 m)    Weight Status:   Wt Readings from Last 1 Encounters:  03/29/14 261 lb 3.9 oz (118.5 kg)    Re-estimated needs:  Kcal: 2300-2500 Protein: 112-124 grams Fluid: 2.3-2.5 L  Skin: stage II pressure ulcer on lt buttocks, bilateral leg cellulitis  Diet Order: Diet Carb Modified   Intake/Output Summary (Last 24 hours) at 04/04/14  1312 Last data filed at 04/04/14 0800  Gross per 24 hour  Intake    370 ml  Output    250 ml  Net    120 ml    Last BM: 04/04/14   Labs:   Recent Labs Lab 04/02/14 0226 04/03/14 0257 04/04/14 0317  NA 141 137 133*  K 4.7 4.6 4.8  CL 105 102 100  CO2 22 21 20   BUN 33* 39* 43*  CREATININE 2.22* 2.40* 2.44*  CALCIUM 9.2 9.0 9.1  GLUCOSE 94 97 98    CBG (last 3)  No results for input(s): GLUCAP in the last 72 hours.  Scheduled Meds: .  ceFAZolin (ANCEF) IV  1 g Intravenous 3 times per day  . feeding supplement (GLUCERNA SHAKE)  237 mL Oral BID BM  . feeding supplement (PRO-STAT SUGAR FREE 64)  30 mL Oral BID BM  . heparin  5,000 Units Subcutaneous 3 times per day  . hydrocerin   Topical Daily  . pantoprazole  40 mg Oral Daily  . sodium chloride  3 mL Intravenous Q12H    Continuous Infusions: . sodium chloride 10 mL/hr at 04/03/14 1700    Zailah Zagami A. Jimmye Norman, RD, LDN Pager: [phone] After hours Pager: [phone]

## 2014-04-04 NOTE — Progress Notes (Addendum)
[facility] PROGRESS NOTE  Juan [name] DPO:[id number] DOB: [name] 23, 1942 DOA: 03/29/2014 PCP: Juan November, MD  Assessment/Plan: Active Problems:   Obstructive sleep apnea   Cellulitis   Leukocytosis   Sepsis   Morbid obesity due to excess calories   Malnutrition of moderate degree   Cellulitis of right lower leg   Venous stasis dermatitis of both lower extremities   Bilateral lower extremity cellulitis/sepsis/persistent fever Currently on cefazolin MRI  Shows diffuse edema of Juan leg and Juan foot, no abscess switched to cefazolin 11/16 -renally dosed. [name] not known to have mrsa colonization. discontinued vanco and cefepime.   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  Juan [name] refused a bone marrow biopsy. Juan [name] is non-compliant and has very poor understanding of disease process. Juan [name] was recommended as a therapy. However, Juan [name] failed to follow up at either [place].  Juan [name] is now too weak for systemic treatment and oncology does not recommend repeat BM biopsy. Oncology recommends palliative care consultation   Sinus tachycardia Was likely secondary to sepsis [name] has a history of myeloproliferative disorder VQ scan low probability  bilateral lower extremity Doppler to rule out DVT,negative  Acute on chronic renal failure Creatinine stabilizing Renal ultrasound does not show any hydronephrosis, it shows atrophy kidney, splenomegaly Uric acid level within normal limits Lasix today per renal Appreciate nephrology input, Dr. 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 Anagrelide discontinued on 11/15 as his platelet counts were 179 as of 11/15 Will continue to monitor.  History of CAD  - Reviewed outpatient cardiology notes-had a normal Myoview in 2007.  - Stable without any chest pain or shortness of breath  We'll cycle cardiac enzymes in Juan setting of sepsis  Chronic lower extremity edema, stasis dermatitis  - Chronic issue-as noted above-current exam consistent with chronic stasis dermatitis changes.  Resume Lasix today   Malnutrition Continue Glucerna and pro stat   Code Status: full Family Communication: family updated about [name]'s clinical progress Disposition Plan:  Continue in step down unit, palliative care consultation requested, [name] at high risk of cardiac arrest, CODE BLUE   Brief narrative: [name] is a 73 y.o. male  Who presents from home, Juan [name] lives with his son. [name] has received 8 mg of IV morphine and is fairly lethargic. Juan [name] is able to be aroused but unable to provide any meaningful history. History obtained from ER chart. Juan [name] has chronic lower extremity edema right greater than left.for Juan last 1 months [name]'s and having increasing swelling and pain in his right foot and leg. Pain is so terrible that [name] has been unable to bear weight. [name] does admit to chills but denies fever. In Juan ER, [name] was noticed to be Desatting while sleeping into Juan  80s.Juan [name] was found to have an increased lactic acid as well as an increased  white blood cell count. His right leg was warm swollen and tender appearing as a cellulitis.of note [name] had a lower sugar many ultrasound done in September 2015 negative for DVTs. There was noted to be a lot of swelling on Juan ultrasound. Upon further chart review. As it appears Juan [name] was also admitted in September 2015 for a questionable right lower extremity cellulitis. Juan cellulitis improved on IV antibiotics. [name] is currently febrile.per ER records appears Juan [name] vomited 1.   Consultants:  none  Procedures:  none  Antibiotics:  Vancomycin/Zosyn 11/16  Cefazolin 11/16  HPI/Subjective: Similar complaints of a daily basis about heartburn, indigestion, generalized body ache [name] endorses diarrhea, as per RN Juan [name] had one solid BM  Objective: Filed Vitals:   04/04/14 0331 04/04/14 0757 04/04/14 0800 04/04/14 1206  BP: 118/73 136/68 136/68 143/79  Pulse: 118 122 119 121  Temp: 97.9 F (36.6 C) 98.1 F (36.7 C)  98.2 F (36.8 C)  TempSrc: Oral Oral  Oral  Resp: $Remo'22 19 15 21  'uynYR$ Height:      Weight:      SpO2: 94% 90% 93% 91%    Intake/Output Summary (Last 24 hours) at 04/04/14 1409 Last data filed at 04/04/14 0800  Gross per 24 hour  Intake    320 ml  Output    250 ml  Net     70 ml    Exam:  General: alert & oriented x 3 In NAD  Cardiovascular: RRR, nl S1 s2  Respiratory: Decreased breath sounds at Juan bases, scattered rhonchi, no crackles  Abdomen: soft +BS NT/ND, no masses palpable  Extremities: bilateral lower extremity edema and erythema, right greater than left      Data Reviewed: Basic Metabolic Panel:  Recent Labs Lab 03/31/14 0350 04/01/14 0230 04/02/14 0226 04/03/14 0257 04/04/14 0317  NA 138 139 141 137 133*  K 4.2 4.3 4.7 4.6 4.8  CL 103 104 105 102 100  CO2 $Re'21 21 22 21 20  'tdE$ GLUCOSE 112* 131* 94 97 98  BUN 29* 30* 33* 39* 43*  CREATININE 1.64* 1.89* 2.22* 2.40* 2.44*  CALCIUM 8.9 9.1 9.2 9.0 9.1    Liver Function  Tests:  Recent Labs Lab 03/31/14 0350 04/01/14 0230 04/02/14 0226  AST $Re'18 22 18  'QHv$ ALT $R'12 12 12  'TX$ ALKPHOS 101 122* 136*  BILITOT 0.5 0.5 0.5  PROT 5.8* 6.1 6.0  ALBUMIN 2.2* 2.0* 1.9*   No results for input(s): LIPASE, AMYLASE in Juan last 168 hours. No results for input(s): AMMONIA in Juan last 168 hours.  CBC:  Recent Labs Lab 03/29/14 1549  04/01/14 0230 04/01/14 1420 04/02/14 0226 04/03/14 0257 04/04/14 0317  WBC 17.8*  < > 24.4* 27.2* 28.8* 32.7* 34.0*  NEUTROABS 9.8*  --   --  18.8* 19.8* 24.5* 28.9*  HGB 10.3*  < > 8.9* 9.1* 9.1* 7.6* 8.1*  HCT 31.9*  < > 27.8* 28.9* 29.5* 23.7* 25.5*  MCV 80.2  < > 80.8 81.0 81.9 80.6 80.2  PLT 292  < > 196 179 PLATELET CLUMPS NOTED ON SMEAR, COUNT APPEARS ADEQUATE PLATELET CLUMPS NOTED ON SMEAR, COUNT APPEARS ADEQUATE 172  < > = values in this interval not displayed.  Cardiac Enzymes:  Recent Labs Lab 03/29/14 1549 04/01/14 0855 04/01/14 1420 04/01/14 1945  TROPONINI <0.30 <0.30 <0.30 <0.30   BNP (last 3 results)  Recent Labs  12/09/13 0751  01/25/14 1115 03/29/14 1549  PROBNP 5630.0* 828.2* 6628.0*     CBG: No results for input(s): GLUCAP in Juan last 168 hours.  Recent Results (from Juan past 240 hour(s))  Blood culture (routine x 2)     Status: None (Preliminary result)   Collection Time: 03/29/14  3:48 PM  Result Value Ref Range Status   Specimen Description BLOOD ARM RIGHT  Final   Special Requests BOTTLES DRAWN AEROBIC AND ANAEROBIC 5CC  Final   Culture  Setup Time   Final    03/30/2014 01:30 Performed at [facility]    Culture   Final           BLOOD CULTURE RECEIVED NO GROWTH TO DATE CULTURE WILL BE HELD FOR 5 DAYS BEFORE ISSUING A FINAL NEGATIVE REPORT Performed at [facility]    Report Status PENDING  Incomplete  Blood culture (routine x 2)     Status: None (Preliminary result)   Collection Time: 03/29/14  5:39 PM  Result Value Ref Range Status   Specimen Description BLOOD RIGHT  ARM  Final   Special Requests BOTTLES DRAWN AEROBIC AND ANAEROBIC 5CC  Final   Culture  Setup Time   Final    03/30/2014 01:30 Performed at [facility]    Culture   Final           BLOOD CULTURE RECEIVED NO GROWTH TO DATE CULTURE WILL BE HELD FOR 5 DAYS BEFORE ISSUING A FINAL NEGATIVE REPORT Performed at [facility]    Report Status PENDING  Incomplete  MRSA PCR Screening     Status: None   Collection Time: 03/29/14  9:25 PM  Result Value Ref Range Status   MRSA by PCR NEGATIVE NEGATIVE Final    Comment:        Juan GeneXpert MRSA Assay (FDA approved for NASAL specimens only), is one component of a comprehensive MRSA colonization surveillance program. It is not intended to diagnose MRSA infection nor to guide or monitor treatment for MRSA infections.   Culture, blood (routine x 2)     Status: None (Preliminary result)   Collection Time: 03/31/14  6:36 PM  Result Value Ref Range Status   Specimen Description BLOOD LEFT HAND  Final   Special Requests BOTTLES DRAWN AEROBIC AND ANAEROBIC 5CC  Final   Culture  Setup Time   Final    04/01/2014 01:38 Performed at [facility]    Culture   Final           BLOOD CULTURE RECEIVED NO GROWTH TO DATE CULTURE WILL BE HELD FOR 5 DAYS BEFORE ISSUING A FINAL NEGATIVE REPORT Performed at [facility]    Report Status PENDING  Incomplete  Culture, blood (routine x 2)     Status: None (Preliminary result)   Collection Time: 03/31/14  6:41 PM  Result Value Ref Range Status   Specimen Description BLOOD RIGHT ARM  Final   Special Requests BOTTLES DRAWN AEROBIC AND ANAEROBIC 5CC  Final   Culture  Setup Time   Final    04/01/2014 01:38 Performed at [facility]    Culture   Final           BLOOD CULTURE RECEIVED NO GROWTH TO DATE CULTURE WILL BE HELD FOR 5 DAYS BEFORE ISSUING A FINAL NEGATIVE REPORT Performed at [facility]    Report Status PENDING  Incomplete     Studies: Mr Tibia  Fibula Right Wo Contrast  04/01/2014  CLINICAL DATA:  Leg swelling. Cellulitis. Abscess. Initial encounter.  EXAM: MRI OF Juan RIGHT FOREFOOT WITHOUT CONTRAST; MRI OF LOWER RIGHT EXTREMITY WITHOUT CONTRAST  TECHNIQUE: Multiplanar, multisequence MR imaging was performed. No intravenous contrast was administered.  COMPARISON:  No radiographs for comparison.  FINDINGS: Abnormal bone marrow signal is present in Juan tibia and fibula. Bone marrow signal shows heterogenous marrow. This is a nonspecific finding most commonly associated with obesity, anemia, cigarette smoking or chronic disease.  There is diffuse subcutaneous infiltration circumferentially around Juan RIGHT leg, most compatible with cellulitis in Juan appropriate clinical setting but nonspecific. Fatty atrophy of Juan inferior medial head of Juan gastrocnemius is present. This is superimposed on mild diffuse atrophy of Juan leg. There is no deep soft tissue abscess. Edema of Juan muscular compartments may represent dependent or reactive edema. Infectious myositis is considered unlikely.  Evaluation of Juan foot shows similar findings with more prominent edema anterior to Juan ankle and in Juan dorsum of Juan foot. Minimal ankle effusion. Subtalar joints appear within normal limits. Juan Achilles tendon, posterior medial and posterior lateral tendon groups appear intact. No findings to suggest osteomyelitis. Hindfoot bones appear within normal limits.  IMPRESSION: Diffuse edema of Juan leg and foot, which is nonspecific but can represent cellulitis, dependent edema or lymphedema. No osteomyelitis or abscess.   Electronically Signed   By: Dereck Ligas M.D.   On: 04/01/2014 12:45   Mr Foot Right Wo Contrast  04/01/2014   CLINICAL DATA:  Leg swelling. Cellulitis. Abscess. Initial encounter.  EXAM: MRI OF Juan RIGHT FOREFOOT WITHOUT CONTRAST; MRI OF LOWER RIGHT EXTREMITY WITHOUT CONTRAST  TECHNIQUE: Multiplanar, multisequence MR imaging was performed. No intravenous  contrast was administered.  COMPARISON:  No radiographs for comparison.  FINDINGS: Abnormal bone marrow signal is present in Juan tibia and fibula. Bone marrow signal shows heterogenous marrow. This is a nonspecific finding most commonly associated with obesity, anemia, cigarette smoking or chronic disease.  There is diffuse subcutaneous infiltration circumferentially around Juan RIGHT leg, most compatible with cellulitis in Juan appropriate clinical setting but nonspecific. Fatty atrophy of Juan inferior medial head of Juan gastrocnemius is present. This is superimposed on mild diffuse atrophy of Juan leg. There is no deep soft tissue abscess. Edema of Juan muscular compartments may represent dependent or reactive edema. Infectious myositis is considered unlikely.  Evaluation of Juan foot shows similar findings with more prominent edema anterior to Juan ankle and in Juan dorsum of Juan foot. Minimal ankle effusion. Subtalar joints appear within normal limits. Juan Achilles tendon, posterior medial and posterior lateral tendon groups appear intact. No findings to suggest osteomyelitis. Hindfoot bones appear within normal limits.  IMPRESSION: Diffuse edema of Juan leg and foot, which is nonspecific but can represent cellulitis, dependent edema or lymphedema. No osteomyelitis or abscess.   Electronically Signed   By: Dereck Ligas M.D.   On: 04/01/2014 12:45   Nm Pulmonary Perf And Vent  03/31/2014   CLINICAL DATA:  Shortness of breath  EXAM: NUCLEAR MEDICINE VENTILATION - PERFUSION LUNG SCAN  TECHNIQUE: Ventilation images were obtained in multiple projections using inhaled aerosol technetium 99 M DTPA. Perfusion images were obtained in multiple projections after intravenous injection of Tc-2m MAA.  RADIOPHARMACEUTICALS:  40 mCi Tc-47m DTPA aerosol and 6 mCi Tc-59m MAA  COMPARISON:  None.  FINDINGS: Ventilation: Only limited ventilation images were obtained. They demonstrate no definitive ventilation defect. [name] could  not tolerate laying on Juan table.  Perfusion: Limited per fusion views were obtained  and demonstrate no perfusion defect. Again full imaging could not be performed due to [name]'s intolerance of positioning.  IMPRESSION: Extremely limited exam although no perfusion defect to suggest pulmonary embolism is noted.   Electronically Signed   By: Inez Catalina M.D.   On: 03/31/2014 14:25   US Renal Port  04/03/2014   CLINICAL DATA:  Renal failure, personal history of coronary artery disease, hypertension, CHF, type 2 diabetes, acute leukemia  EXAM: RENAL/URINARY TRACT ULTRASOUND COMPLETE  COMPARISON:  None  FINDINGS: Right Kidney:  Length: 12.8 cm. Cortical thinning. Upper cortical echogenicity. Hypoechoic nodule at inferior pole question minimally complicated cyst versus simple cyst with artifacts. No additional mass, hydronephrosis or shadowing calcification.  Left Kidney:  Length: 11.6 cm. Cortical thinning. Increased cortical echogenicity. No mass, hydronephrosis or shadowing calcification.  Bladder:  Large fluid containing structure with loculations in pelvis, suspect representing a large bladder with diverticula and trabeculations though cystic pelvic mass not completely excluded. [name] reportedly has a Foley catheter but this is not visualized.  Incidentally noted splenomegaly, 21.8 cm length with calculated volume 1260 mL.  IMPRESSION: Atrophic kidneys with question cyst mid inferior pole RIGHT kidney, uncertain if complicated versus simple with artifacts.  No evidence of hydronephrosis.  Large anechoic structure with multiple loculations in pelvis suspect representing a distended urinary bladder with multiple diverticula and bladder wall trabeculations though a cystic pelvic mass not entirely excluded since Juan [name]'s indwelling Foley catheter is not localized ; noncontrast CT of Juan pelvis recommended to assess.  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   Electronically Signed   By: Lavonia Dana M.D.   On: 04/03/2014 19:59   Dg Chest Port 1 View  03/31/2014   CLINICAL DATA:  73 year old male with shortness of breath.  EXAM: PORTABLE CHEST - 1 VIEW  COMPARISON:  03/29/2014 and prior chest radiographs dating back to 08/04/2004  FINDINGS: Juan cardiomediastinal silhouette is unchanged.  Pulmonary vascular congestion and probable interstitial edema again noted.  Bilateral lower lung atelectasis/airspace disease again noted.  Increased bony density is again noted.  IMPRESSION: Little interval change since prior study with probable pulmonary edema and bilateral lower lung atelectasis/ airspace disease.   Electronically Signed   By: Hassan Rowan M.D.   On: 03/31/2014 14:24   Dg Chest Portable 1 View  03/29/2014   CLINICAL DATA:  Cellulitis. Cough and congestion. Shortness of breath. Chest pain. Nausea, vomiting, fever. History of hypertension and smoking.  EXAM: PORTABLE CHEST - 1 VIEW  COMPARISON:  01/26/2014  FINDINGS: Shallow inflation. Heart is enlarged. Mild pulmonary edema. There are no focal consolidations. Although not well evaluated on this portable view of Juan chest, Juan bones have a somewhat mottled lucent appearance raising Juan question of metabolic or metastatic disorder.  IMPRESSION: 1. Cardiomegaly and pulmonary edema. 2. Question of diffuse osseous changes. Recommend correlation with history and lab values. Consider further imaging if needed.   Electronically Signed   By: Shon Hale M.D.   On: 03/29/2014 18:01    Scheduled Meds: .  ceFAZolin (ANCEF) IV  1 g Intravenous 3 times per day  . feeding supplement (GLUCERNA SHAKE)  237 mL Oral BID BM  . feeding supplement (PRO-STAT SUGAR FREE 64)  30 mL Oral BID BM  . heparin  5,000 Units Subcutaneous 3 times per day  . hydrocerin   Topical Daily  . pantoprazole  40 mg Oral Daily  . sodium chloride  3 mL Intravenous Q12H   Continuous Infusions: .  sodium chloride 10 mL/hr at 04/03/14 1700    Active Problems:   Obstructive sleep  apnea   Cellulitis   Leukocytosis   Sepsis   Morbid obesity due to excess calories   Malnutrition of moderate degree   Cellulitis of right lower leg   Venous stasis dermatitis of both lower extremities    Time spent: 40 minutes   [facility] Pager [phone]. If 7PM-7AM, please contact night-coverage at [email or web address], password [facility] 04/04/2014, 2:09 PM  LOS: 6 days

## 2014-04-05 LAB — BASIC METABOLIC PANEL
ANION GAP: 13 (ref 5–15)
BUN: 41 mg/dL — ABNORMAL HIGH (ref 6–23)
CALCIUM: 8.9 mg/dL (ref 8.4–10.5)
CO2: 23 mEq/L (ref 19–32)
CREATININE: 2.27 mg/dL — AB (ref 0.50–1.35)
Chloride: 105 mEq/L (ref 96–112)
GFR calc non Af Amer: 27 mL/min — ABNORMAL LOW (ref >90)
GFR, EST AFRICAN AMERICAN: 31 mL/min — AB (ref >90)
Glucose, Bld: 111 mg/dL — ABNORMAL HIGH (ref 70–99)
Potassium: 4.6 mEq/L (ref 3.7–5.3)
SODIUM: 141 meq/L (ref 137–147)

## 2014-04-05 LAB — CBC WITH DIFFERENTIAL/PLATELET
BLASTS: 1 %
Band Neutrophils: 40 % — ABNORMAL HIGH (ref 0–10)
Basophils Absolute: 0 10*3/uL (ref 0.0–0.1)
Basophils Relative: 0 % (ref 0–1)
EOS ABS: 1.1 10*3/uL — AB (ref 0.0–0.7)
EOS PCT: 3 % (ref 0–5)
HCT: 26.6 % — ABNORMAL LOW (ref 39.0–52.0)
Hemoglobin: 8.3 g/dL — ABNORMAL LOW (ref 13.0–17.0)
Lymphocytes Relative: 9 % — ABNORMAL LOW (ref 12–46)
Lymphs Abs: 3.2 10*3/uL (ref 0.7–4.0)
MCH: 25 pg — AB (ref 26.0–34.0)
MCHC: 31.2 g/dL (ref 30.0–36.0)
MCV: 80.1 fL (ref 78.0–100.0)
METAMYELOCYTES PCT: 8 %
MYELOCYTES: 5 %
Monocytes Absolute: 0.7 10*3/uL (ref 0.1–1.0)
Monocytes Relative: 2 % — ABNORMAL LOW (ref 3–12)
NRBC: 0 /100{WBCs}
Neutro Abs: 29.8 10*3/uL — ABNORMAL HIGH (ref 1.7–7.7)
Neutrophils Relative %: 29 % — ABNORMAL LOW (ref 43–77)
Platelets: 230 10*3/uL (ref 150–400)
Promyelocytes Absolute: 3 %
RBC: 3.32 MIL/uL — ABNORMAL LOW (ref 4.22–5.81)
RDW: 19.6 % — AB (ref 11.5–15.5)
WBC: 35 10*3/uL — ABNORMAL HIGH (ref 4.0–10.5)

## 2014-04-05 LAB — CULTURE, BLOOD (ROUTINE X 2)
Culture: NO GROWTH
Culture: NO GROWTH

## 2014-04-05 MED ORDER — FUROSEMIDE 10 MG/ML IJ SOLN
80.0000 mg | Freq: Once | INTRAMUSCULAR | Status: AC
  Administered 2014-04-05: 80 mg via INTRAVENOUS
  Filled 2014-04-05: qty 8

## 2014-04-05 MED ORDER — FUROSEMIDE 40 MG PO TABS
40.0000 mg | ORAL_TABLET | Freq: Two times a day (BID) | ORAL | Status: DC
  Administered 2014-04-06 – 2014-04-10 (×9): 40 mg via ORAL
  Filled 2014-04-05 (×10): qty 1

## 2014-04-05 NOTE — Progress Notes (Signed)
I waited to speak to Mr. Haddix while he was being cleaned up from a bowel movement. He told me he wanted me to wait and then he had me order him his food and then said he was too tired to talk and asked me to come back tomorrow. I will attempt again tomorrow to have a conversation with Mr. Busby. From our conversation over just a few minutes he did not seem to understand the gravity of his situation at all. I will follow up tomorrow after lunch ~1200-1300 pm per patient request.   Vinie Sill, NP Palliative Medicine Team Pager # [phone] (M-F 8a-5p) Team Phone # [phone] (Nights/Weekends)

## 2014-04-05 NOTE — Progress Notes (Addendum)
[facility] PROGRESS NOTE  Juan [name] EXN:[id number] DOB: 12/27/1940 DOA: 03/29/2014 PCP: Juan November, MD  Assessment/Plan: Active Problems:   Obstructive sleep apnea   Cellulitis   Leukocytosis   Sepsis   Morbid obesity due to excess calories   Malnutrition of moderate degree   Cellulitis of right lower leg   Venous stasis dermatitis of both lower extremities .   Bilateral lower extremity cellulitis/sepsis/persistent fever Currently on cefazolin MRI  Shows diffuse edema of the leg and the foot, no abscess switched to cefazolin 11/16 -renally dosed. Patient not known to have mrsa colonization. discontinued vanco and cefepime. 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  He refused a bone marrow biopsy. The patient is non-compliant and has very poor understanding of disease process. Juan [name] was recommended as a therapy. However, He failed to follow up at either [place].  The patient is now too weak for systemic treatment and oncology does not recommend repeat BM biopsy. Oncology recommends palliative care consultation, however the patient would like to speak to them again RN to call cancer treatment Center   Sinus tachycardia Was likely secondary to sepsis Patient has a history of myeloproliferative disorder VQ scan low probability  bilateral lower extremity Doppler to rule out DVT,negative  Acute on chronic renal failure Creatinine stabilizing Renal ultrasound does not show any hydronephrosis, it shows atrophy kidney, splenomegaly Uric acid level within normal limits Lasix today per renal Appreciate nephrology input, Juan [name], patient remains nonoliguric, renal function seems to have pleateued   Chronic diastolic  heart failure  -Echocardiogram in January 2015 showed a grade 1 diastolic dysfunction with an EF of 55-60%  - Clinically compensated  Holding Lasix and lisinopril   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 Anagrelide discontinued on 11/15 as his platelet counts were 179 as of 11/15 Will continue to monitor.  History of CAD  - Reviewed outpatient cardiology notes-had a normal Myoview in 2007.  - Stable without any chest pain or shortness of breath  We'll cycle cardiac enzymes in the setting of sepsis  Chronic lower extremity edema, stasis dermatitis  - Chronic issue-as noted above-current exam consistent with chronic stasis dermatitis changes.  Resume Lasix today   Malnutrition Continue Glucerna and pro stat   Code Status: full Family Communication: family updated about patient's clinical progress Disposition Plan:  Continue in step down unit, palliative care consultation requested, patient at high risk of cardiac arrest, CODE BLUE, transfer to [place] if patient is agreeable to a DO NOT RESUSCITATE status   Brief narrative: Juan [name] is a 73 y.o. male  Who presents from home, he lives with his son. Patient has received 8 mg of IV morphine and is fairly lethargic. He is able to be aroused but unable to provide any meaningful history. History obtained from ER chart. The patient has chronic lower extremity edema right greater than left.for the last 1 months patient's and having increasing swelling and pain in his right foot and leg.  Pain is so terrible that patient has been unable to bear weight. Patient does  admit to chills but denies fever. In the ER, patient was noticed to be Desatting while sleeping into the 80s.the patient was found to have an increased lactic acid as well as an increased white blood cell count. His right leg was warm swollen and tender appearing as a cellulitis.of note patient had a lower sugar many ultrasound done in September 2015 negative for DVTs. There was noted to be a lot of swelling on the ultrasound. Upon further chart review. As it appears the patient was also admitted in September 2015 for a questionable right lower extremity cellulitis. The cellulitis improved on IV antibiotics. Patient is currently febrile.per ER records appears he vomited 1.   Consultants:  none  Procedures:  none  Antibiotics:  Vancomycin/Zosyn 11/16  Cefazolin 11/16  HPI/Subjective: Patient endorses diarrhea and nausea, however RN states that the patient is currently incontinent of stool and there is no diarrhea  Objective: Filed Vitals:   04/04/14 2000 04/05/14 0000 04/05/14 0300 04/05/14 0841  BP:  133/77 141/72 133/74  Pulse:  118 102 112  Temp: 98 F (36.7 C) 98.2 F (36.8 C) 97.4 F (36.3 C) 97.8 F (36.6 C)  TempSrc: Oral Oral Oral Oral  Resp:  $Remo'23 17 21  'SYnfy$ Height:      Weight:      SpO2:  94% 100% 97%    Intake/Output Summary (Last 24 hours) at 04/05/14 1038 Last data filed at 04/05/14 1000  Gross per 24 hour  Intake   1070 ml  Output   1400 ml  Net   -330 ml    Exam:  General: alert & oriented x 3 In NAD  Cardiovascular: RRR, nl S1 s2  Respiratory: Decreased breath sounds at the bases, scattered rhonchi, no crackles  Abdomen: soft +BS NT/ND, no masses palpable  Extremities: bilateral lower extremity edema and erythema, right greater than left      Data Reviewed: Basic Metabolic Panel:  Recent Labs Lab 04/01/14 0230 04/02/14 0226 04/03/14 0257 04/04/14 0317 04/05/14 0305  NA 139 141 137 133* 141  K 4.3 4.7 4.6 4.8 4.6  CL 104 105 102 100  105  CO2 $Re'21 22 21 20 23  'nnR$ GLUCOSE 131* 94 97 98 111*  BUN 30* 33* 39* 43* 41*  CREATININE 1.89* 2.22* 2.40* 2.44* 2.27*  CALCIUM 9.1 9.2 9.0 9.1 8.9    Liver Function Tests:  Recent Labs Lab 03/31/14 0350 04/01/14 0230 04/02/14 0226  AST $Re'18 22 18  'ocL$ ALT $R'12 12 12  'nd$ ALKPHOS 101 122* 136*  BILITOT 0.5 0.5 0.5  PROT 5.8* 6.1 6.0  ALBUMIN 2.2* 2.0* 1.9*   No results for input(s): LIPASE, AMYLASE in the last 168 hours. No results for input(s): AMMONIA in the last 168 hours.  CBC:  Recent Labs Lab 04/01/14 1420 04/02/14 0226 04/03/14 0257 04/04/14 0317 04/05/14 0305  WBC 27.2* 28.8* 32.7* 34.0* 35.0*  NEUTROABS 18.8* 19.8* 24.5* 28.9* 29.8*  HGB 9.1* 9.1* 7.6* 8.1* 8.3*  HCT 28.9* 29.5* 23.7* 25.5* 26.6*  MCV 81.0 81.9 80.6 80.2 80.1  PLT 179 PLATELET CLUMPS NOTED ON SMEAR, COUNT APPEARS ADEQUATE PLATELET CLUMPS NOTED ON SMEAR, COUNT APPEARS ADEQUATE 172 230    Cardiac Enzymes:  Recent Labs Lab 03/29/14 1549 04/01/14 0855 04/01/14 1420 04/01/14 1945  TROPONINI <0.30 <0.30 <0.30 <0.30   BNP (last 3 results)  Recent Labs  12/09/13 0751 01/25/14 1115 03/29/14 1549  PROBNP 5630.0* 828.2*  6628.0*     CBG: No results for input(s): GLUCAP in the last 168 hours.  Recent Results (from the past 240 hour(s))  Blood culture (routine x 2)     Status: None   Collection Time: 03/29/14  3:48 PM  Result Value Ref Range Status   Specimen Description BLOOD ARM RIGHT  Final   Special Requests BOTTLES DRAWN AEROBIC AND ANAEROBIC 5CC  Final   Culture  Setup Time   Final    03/30/2014 01:30 Performed at [facility]    Culture   Final    NO GROWTH 5 DAYS Performed at [facility]    Report Status 04/05/2014 FINAL  Final  Blood culture (routine x 2)     Status: None   Collection Time: 03/29/14  5:39 PM  Result Value Ref Range Status   Specimen Description BLOOD RIGHT ARM  Final   Special Requests BOTTLES DRAWN AEROBIC AND ANAEROBIC 5CC  Final    Culture  Setup Time   Final    03/30/2014 01:30 Performed at [facility]    Culture   Final    NO GROWTH 5 DAYS Performed at [facility]    Report Status 04/05/2014 FINAL  Final  MRSA PCR Screening     Status: None   Collection Time: 03/29/14  9:25 PM  Result Value Ref Range Status   MRSA by PCR NEGATIVE NEGATIVE Final    Comment:        The GeneXpert MRSA Assay (FDA approved for NASAL specimens only), is one component of a comprehensive MRSA colonization surveillance program. It is not intended to diagnose MRSA infection nor to guide or monitor treatment for MRSA infections.   Culture, blood (routine x 2)     Status: None (Preliminary result)   Collection Time: 03/31/14  6:36 PM  Result Value Ref Range Status   Specimen Description BLOOD LEFT HAND  Final   Special Requests BOTTLES DRAWN AEROBIC AND ANAEROBIC 5CC  Final   Culture  Setup Time   Final    04/01/2014 01:38 Performed at [facility]    Culture   Final           BLOOD CULTURE RECEIVED NO GROWTH TO DATE CULTURE WILL BE HELD FOR 5 DAYS BEFORE ISSUING A FINAL NEGATIVE REPORT Performed at [facility]    Report Status PENDING  Incomplete  Culture, blood (routine x 2)     Status: None (Preliminary result)   Collection Time: 03/31/14  6:41 PM  Result Value Ref Range Status   Specimen Description BLOOD RIGHT ARM  Final   Special Requests BOTTLES DRAWN AEROBIC AND ANAEROBIC 5CC  Final   Culture  Setup Time   Final    04/01/2014 01:38 Performed at [facility]    Culture   Final           BLOOD CULTURE RECEIVED NO GROWTH TO DATE CULTURE WILL BE HELD FOR 5 DAYS BEFORE ISSUING A FINAL NEGATIVE REPORT Performed at [facility]    Report Status PENDING  Incomplete     Studies: Mr Tibia Fibula Right Wo Contrast  04/01/2014   CLINICAL DATA:  Leg swelling. Cellulitis. Abscess. Initial encounter.  EXAM: MRI OF THE RIGHT FOREFOOT WITHOUT CONTRAST; MRI OF LOWER RIGHT  EXTREMITY WITHOUT CONTRAST  TECHNIQUE: Multiplanar, multisequence MR imaging was performed. No intravenous contrast was administered.  COMPARISON:  No radiographs for comparison.  FINDINGS: Abnormal bone marrow signal is present in  the tibia and fibula. Bone marrow signal shows heterogenous marrow. This is a nonspecific finding most commonly associated with obesity, anemia, cigarette smoking or chronic disease.  There is diffuse subcutaneous infiltration circumferentially around the RIGHT leg, most compatible with cellulitis in the appropriate clinical setting but nonspecific. Fatty atrophy of the inferior medial head of the gastrocnemius is present. This is superimposed on mild diffuse atrophy of the leg. There is no deep soft tissue abscess. Edema of the muscular compartments may represent dependent or reactive edema. Infectious myositis is considered unlikely.  Evaluation of the foot shows similar findings with more prominent edema anterior to the ankle and in the dorsum of the foot. Minimal ankle effusion. Subtalar joints appear within normal limits. The Achilles tendon, posterior medial and posterior lateral tendon groups appear intact. No findings to suggest osteomyelitis. Hindfoot bones appear within normal limits.  IMPRESSION: Diffuse edema of the leg and foot, which is nonspecific but can represent cellulitis, dependent edema or lymphedema. No osteomyelitis or abscess.   Electronically Signed   By: Dereck Ligas M.D.   On: 04/01/2014 12:45   Mr Foot Right Wo Contrast  04/01/2014   CLINICAL DATA:  Leg swelling. Cellulitis. Abscess. Initial encounter.  EXAM: MRI OF THE RIGHT FOREFOOT WITHOUT CONTRAST; MRI OF LOWER RIGHT EXTREMITY WITHOUT CONTRAST  TECHNIQUE: Multiplanar, multisequence MR imaging was performed. No intravenous contrast was administered.  COMPARISON:  No radiographs for comparison.  FINDINGS: Abnormal bone marrow signal is present in the tibia and fibula. Bone marrow signal shows  heterogenous marrow. This is a nonspecific finding most commonly associated with obesity, anemia, cigarette smoking or chronic disease.  There is diffuse subcutaneous infiltration circumferentially around the RIGHT leg, most compatible with cellulitis in the appropriate clinical setting but nonspecific. Fatty atrophy of the inferior medial head of the gastrocnemius is present. This is superimposed on mild diffuse atrophy of the leg. There is no deep soft tissue abscess. Edema of the muscular compartments may represent dependent or reactive edema. Infectious myositis is considered unlikely.  Evaluation of the foot shows similar findings with more prominent edema anterior to the ankle and in the dorsum of the foot. Minimal ankle effusion. Subtalar joints appear within normal limits. The Achilles tendon, posterior medial and posterior lateral tendon groups appear intact. No findings to suggest osteomyelitis. Hindfoot bones appear within normal limits.  IMPRESSION: Diffuse edema of the leg and foot, which is nonspecific but can represent cellulitis, dependent edema or lymphedema. No osteomyelitis or abscess.   Electronically Signed   By: Dereck Ligas M.D.   On: 04/01/2014 12:45   Nm Pulmonary Perf And Vent  03/31/2014   CLINICAL DATA:  Shortness of breath  EXAM: NUCLEAR MEDICINE VENTILATION - PERFUSION LUNG SCAN  TECHNIQUE: Ventilation images were obtained in multiple projections using inhaled aerosol technetium 99 M DTPA. Perfusion images were obtained in multiple projections after intravenous injection of Tc-53m MAA.  RADIOPHARMACEUTICALS:  40 mCi Tc-54m DTPA aerosol and 6 mCi Tc-22m MAA  COMPARISON:  None.  FINDINGS: Ventilation: Only limited ventilation images were obtained. They demonstrate no definitive ventilation defect. Patient could not tolerate laying on the table.  Perfusion: Limited per fusion views were obtained and demonstrate no perfusion defect. Again full imaging could not be performed due to  patient's intolerance of positioning.  IMPRESSION: Extremely limited exam although no perfusion defect to suggest pulmonary embolism is noted.   Electronically Signed   By: Inez Catalina M.D.   On: 03/31/2014 14:25   US Renal Port  04/03/2014   CLINICAL DATA:  Renal failure, personal history of coronary artery disease, hypertension, CHF, type 2 diabetes, acute leukemia  EXAM: RENAL/URINARY TRACT ULTRASOUND COMPLETE  COMPARISON:  None  FINDINGS: Right Kidney:  Length: 12.8 cm. Cortical thinning. Upper cortical echogenicity. Hypoechoic nodule at inferior pole question minimally complicated cyst versus simple cyst with artifacts. No additional mass, hydronephrosis or shadowing calcification.  Left Kidney:  Length: 11.6 cm. Cortical thinning. Increased cortical echogenicity. No mass, hydronephrosis or shadowing calcification.  Bladder:  Large fluid containing structure with loculations in pelvis, suspect representing a large bladder with diverticula and trabeculations though cystic pelvic mass not completely excluded. Patient reportedly has a Foley catheter but this is not visualized.  Incidentally noted splenomegaly, 21.8 cm length with calculated volume 1260 mL.  IMPRESSION: Atrophic kidneys with question cyst mid inferior pole RIGHT kidney, uncertain if complicated versus simple with artifacts.  No evidence of hydronephrosis.  Large anechoic structure with multiple loculations in pelvis suspect representing a distended urinary bladder with multiple diverticula and bladder wall trabeculations though a cystic pelvic mass not entirely excluded since the patient's indwelling Foley catheter is not localized ; noncontrast CT of the pelvis recommended to assess.  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   Electronically Signed   By: Lavonia Dana M.D.   On: 04/03/2014 19:59   Dg Chest Port 1 View  03/31/2014   CLINICAL DATA:  73 year old male with shortness of breath.  EXAM: PORTABLE CHEST - 1 VIEW   COMPARISON:  03/29/2014 and prior chest radiographs dating back to 08/04/2004  FINDINGS: The cardiomediastinal silhouette is unchanged.  Pulmonary vascular congestion and probable interstitial edema again noted.  Bilateral lower lung atelectasis/airspace disease again noted.  Increased bony density is again noted.  IMPRESSION: Little interval change since prior study with probable pulmonary edema and bilateral lower lung atelectasis/ airspace disease.   Electronically Signed   By: Hassan Rowan M.D.   On: 03/31/2014 14:24   Dg Chest Portable 1 View  03/29/2014   CLINICAL DATA:  Cellulitis. Cough and congestion. Shortness of breath. Chest pain. Nausea, vomiting, fever. History of hypertension and smoking.  EXAM: PORTABLE CHEST - 1 VIEW  COMPARISON:  01/26/2014  FINDINGS: Shallow inflation. Heart is enlarged. Mild pulmonary edema. There are no focal consolidations. Although not well evaluated on this portable view of the chest, the bones have a somewhat mottled lucent appearance raising the question of metabolic or metastatic disorder.  IMPRESSION: 1. Cardiomegaly and pulmonary edema. 2. Question of diffuse osseous changes. Recommend correlation with history and lab values. Consider further imaging if needed.   Electronically Signed   By: Shon Hale M.D.   On: 03/29/2014 18:01    Scheduled Meds: .  ceFAZolin (ANCEF) IV  1 g Intravenous 3 times per day  . feeding supplement (GLUCERNA SHAKE)  237 mL Oral BID BM  . feeding supplement (PRO-STAT SUGAR FREE 64)  30 mL Oral BID BM  . [START ON 04/06/2014] furosemide  40 mg Oral BID  . heparin  5,000 Units Subcutaneous 3 times per day  . hydrocerin   Topical Daily  . pantoprazole  40 mg Oral Daily  . sodium chloride  3 mL Intravenous Q12H   Continuous Infusions: . sodium chloride 10 mL/hr at 04/05/14 1000    Active Problems:   Obstructive sleep apnea   Cellulitis   Leukocytosis   Sepsis   Morbid obesity due to excess calories   Malnutrition of moderate  degree   Cellulitis  of right lower leg   Venous stasis dermatitis of both lower extremities    Time spent: 40 minutes   [facility] Pager [phone]. If 7PM-7AM, please contact night-coverage at [email or web address], password [facility] 04/05/2014, 10:38 AM  LOS: 7 days

## 2014-04-05 NOTE — Progress Notes (Signed)
Patient ID: Juan [name], male   DOB: 1941-04-22, 73 y.o.   MRN: [id number]  [facility] Progress Note    Assessment/ Plan:   1. 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 Low FeNa. Urine output picked up in response to furosemide yesterday and renal function appears to be improving-creatinine better today. Will repeat intravenous furosemide today and recommend to restart his oral furosemide-40 mg twice a day tomorrow. 2. Bilateral lower extremity cellulitis with associated sepsis: Previously on broad-spectrum empiric antibiotic therapy, he remains tachycardic but overall markers of sepsis appear to be improving. 3. Anemia/leukocytosis:with prior history of myelodysplastic syndrome that he did not complete follow-up for, reevaluation undertaken by hematology- the patient refused further evaluation and treatment and a palliative care consult/hospice referral has been recommended. Hematology have signed off. 4. Sinus tachycardia: Ongoing telemetry monitoring-previous workup negative  We'll sign off at this time-please call if further renal assistance required  Subjective:   Reports to have had an uncomfortable night with nausea and vomiting last night-inquires if we can use a stronger cream on his legs    Objective:   BP 141/72 mmHg  Pulse 102  Temp(Src) 97.4 F (36.3 C) (Oral)  Resp 17  Ht 6\' 2"  (1.88 m)  Wt 118.5 kg (261 lb 3.9 oz)  BMI 33.53 kg/m2  SpO2 100%  Intake/Output Summary (Last 24 hours) at 04/05/14 0819 Last data filed at 04/05/14 0500  Gross per 24 hour  Intake    660 ml  Output   1400 ml  Net   -740 ml   Weight change:   Physical Exam: Gen: Appears to be uncomfortable resting in bed CVS: Pulse regular tachycardia, S1 and S2 with ESM Resp: Decreased breath sounds over bases otherwise clear Abd: Soft, obese, nontender Ext: 2+ to 3+ lower extremity  edema  Imaging: US Renal Port  04/03/2014   CLINICAL DATA:  Renal failure, personal history of coronary artery disease, hypertension, CHF, type 2 diabetes, acute leukemia  EXAM: RENAL/URINARY TRACT ULTRASOUND COMPLETE  COMPARISON:  None  FINDINGS: Right Kidney:  Length: 12.8 cm. Cortical thinning. Upper cortical echogenicity. Hypoechoic nodule at inferior pole question minimally complicated cyst versus simple cyst with artifacts. No additional mass, hydronephrosis or shadowing calcification.  Left Kidney:  Length: 11.6 cm. Cortical thinning. Increased cortical echogenicity. No mass, hydronephrosis or shadowing calcification.  Bladder:  Large fluid containing structure with loculations in pelvis, suspect representing a large bladder with diverticula and trabeculations though cystic pelvic mass not completely excluded. Patient reportedly has a Foley catheter but this is not visualized.  Incidentally noted splenomegaly, 21.8 cm length with calculated volume 1260 mL.  IMPRESSION: Atrophic kidneys with question cyst mid inferior pole RIGHT kidney, uncertain if complicated versus simple with artifacts.  No evidence of hydronephrosis.  Large anechoic structure with multiple loculations in pelvis suspect representing a distended urinary bladder with multiple diverticula and bladder wall trabeculations though a cystic pelvic mass not entirely excluded since the patient's indwelling Foley catheter is not localized ; noncontrast CT of the pelvis recommended to assess.  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   Electronically Signed   By: Lavonia Dana M.D.   On: 04/03/2014 19:59    Labs: BMET  Recent Labs Lab 03/30/14 0305 03/31/14 0350 04/01/14 0230 04/02/14 0226 04/03/14 0257 04/04/14 0317 04/05/14 0305  NA 141 138 139 141 137 133* 141  K 4.6 4.2 4.3 4.7 4.6 4.8  4.6  CL 106 103 104 105 102 100 105  CO2 21 21 21 22 21 20 23   GLUCOSE 109* 112* 131* 94 97 98 111*  BUN 34* 29* 30* 33* 39*  43* 41*  CREATININE 1.44* 1.64* 1.89* 2.22* 2.40* 2.44* 2.27*  CALCIUM 9.1 8.9 9.1 9.2 9.0 9.1 8.9   CBC  Recent Labs Lab 04/02/14 0226 04/03/14 0257 04/04/14 0317 04/05/14 0305  WBC 28.8* 32.7* 34.0* 35.0*  NEUTROABS 19.8* 24.5* 28.9* 29.8*  HGB 9.1* 7.6* 8.1* 8.3*  HCT 29.5* 23.7* 25.5* 26.6*  MCV 81.9 80.6 80.2 80.1  PLT PLATELET CLUMPS NOTED ON SMEAR, COUNT APPEARS ADEQUATE PLATELET CLUMPS NOTED ON SMEAR, COUNT APPEARS ADEQUATE 172 230   Medications:    .  ceFAZolin (ANCEF) IV  1 g Intravenous 3 times per day  . feeding supplement (GLUCERNA SHAKE)  237 mL Oral BID BM  . feeding supplement (PRO-STAT SUGAR FREE 64)  30 mL Oral BID BM  . heparin  5,000 Units Subcutaneous 3 times per day  . hydrocerin   Topical Daily  . pantoprazole  40 mg Oral Daily  . sodium chloride  3 mL Intravenous Q12H   Elmarie Shiley, MD 04/05/2014, 8:19 AM

## 2014-04-06 DIAGNOSIS — R531 Weakness: Secondary | ICD-10-CM

## 2014-04-06 DIAGNOSIS — Z515 Encounter for palliative care: Secondary | ICD-10-CM

## 2014-04-06 DIAGNOSIS — R630 Anorexia: Secondary | ICD-10-CM

## 2014-04-06 LAB — CBC WITH DIFFERENTIAL/PLATELET
BLASTS: 0 %
Band Neutrophils: 26 % — ABNORMAL HIGH (ref 0–10)
Basophils Absolute: 0 10*3/uL (ref 0.0–0.1)
Basophils Relative: 0 % (ref 0–1)
Eosinophils Absolute: 0.6 10*3/uL (ref 0.0–0.7)
Eosinophils Relative: 2 % (ref 0–5)
HCT: 26.6 % — ABNORMAL LOW (ref 39.0–52.0)
Hemoglobin: 8.4 g/dL — ABNORMAL LOW (ref 13.0–17.0)
LYMPHS ABS: 3.2 10*3/uL (ref 0.7–4.0)
LYMPHS PCT: 10 % — AB (ref 12–46)
MCH: 25.5 pg — ABNORMAL LOW (ref 26.0–34.0)
MCHC: 31.6 g/dL (ref 30.0–36.0)
MCV: 80.6 fL (ref 78.0–100.0)
MONOS PCT: 3 % (ref 3–12)
Metamyelocytes Relative: 5 %
Monocytes Absolute: 1 10*3/uL (ref 0.1–1.0)
Myelocytes: 5 %
NEUTROS PCT: 45 % (ref 43–77)
NRBC: 0 /100{WBCs}
Neutro Abs: 26.9 10*3/uL — ABNORMAL HIGH (ref 1.7–7.7)
PLATELETS: 338 10*3/uL (ref 150–400)
PROMYELOCYTES ABS: 4 %
RBC: 3.3 MIL/uL — ABNORMAL LOW (ref 4.22–5.81)
RDW: 20.5 % — ABNORMAL HIGH (ref 11.5–15.5)
WBC Morphology: INCREASED
WBC: 31.7 10*3/uL — AB (ref 4.0–10.5)

## 2014-04-06 LAB — BASIC METABOLIC PANEL
Anion gap: 12 (ref 5–15)
BUN: 37 mg/dL — ABNORMAL HIGH (ref 6–23)
CHLORIDE: 105 meq/L (ref 96–112)
CO2: 23 mEq/L (ref 19–32)
Calcium: 9 mg/dL (ref 8.4–10.5)
Creatinine, Ser: 2.13 mg/dL — ABNORMAL HIGH (ref 0.50–1.35)
GFR calc Af Amer: 34 mL/min — ABNORMAL LOW (ref >90)
GFR, EST NON AFRICAN AMERICAN: 29 mL/min — AB (ref >90)
GLUCOSE: 116 mg/dL — AB (ref 70–99)
POTASSIUM: 4.7 meq/L (ref 3.7–5.3)
Sodium: 140 mEq/L (ref 137–147)

## 2014-04-06 NOTE — Progress Notes (Addendum)
[facility] PROGRESS NOTE  Juan [name] TGY:[id number] DOB: [birth year] DOA: 03/29/2014 PCP: [name], MD  Assessment/Plan: Active Problems:   Obstructive sleep apnea   Cellulitis   Leukocytosis   Sepsis   Morbid obesity due to excess calories   Malnutrition of moderate degree   Cellulitis of right lower leg   Venous stasis dermatitis of both lower extremities .   Bilateral lower extremity cellulitis/sepsis/persistent fever Currently on cefazolin MRI  Shows diffuse edema of the leg and the foot, no abscess switched to cefazolin 11/16 -renally dosed. Patient not known to have mrsa colonization. discontinued vanco and cefepime. 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  He refused a bone marrow biopsy. The patient is non-compliant and has very poor understanding of disease process. [name] was recommended as a therapy. However, He failed to follow up at either [place].  The patient is now too weak for systemic treatment and oncology does not recommend repeat BM biopsy. Oncology recommends palliative care consultation, Discussed with  Dr [name] again today. Patient would need to see his oncologist in [facility] after discharge.   Sinus tachycardia Was likely secondary to sepsis, persistent probably secondary to his underlying hematologic process VQ scan low probability  bilateral lower extremity Doppler to rule out DVT,negative  Acute on chronic renal failure Creatinine stabilizing Renal ultrasound does not show any hydronephrosis, it shows atrophy kidney, splenomegaly Uric acid level within normal limits Lasix today per renal Appreciate nephrology input, Dr. 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 Anagrelide discontinued on 11/15 as his platelet counts were 179 as of 11/15 Will continue to monitor.  History of CAD  - Reviewed outpatient cardiology notes-had a normal Myoview in 2007.  - Stable without any chest pain or shortness of breath  We'll cycle cardiac enzymes in the setting of sepsis  Chronic lower extremity edema, stasis dermatitis  - Chronic issue-as noted above-current exam consistent with chronic stasis dermatitis changes.  Resume Lasix today   Malnutrition Continue Glucerna and pro stat   Code Status: full Family Communication: family updated about patient's clinical progress Disposition Plan:  Transfer to telemetry, palliative care consultation requested, patient at high risk of cardiac arrest, CODE BLUE, transfer to [place] if patient is agreeable to a DO NOT RESUSCITATE status   Brief narrative: Juan [name] is a 73 y.o. male  Who presents from home, he lives with his son. Patient has received 8 mg of IV morphine and is fairly lethargic. He is able to be aroused but unable to provide any meaningful history. History obtained from ER chart. The patient has chronic lower extremity edema right greater than left.for the last 1 months patient's and having increasing swelling and pain in his right  foot and leg. Pain is so terrible that patient  has been unable to bear weight. Patient does admit to chills but denies fever. In the ER, patient was noticed to be Desatting while sleeping into the 80s.the patient was found to have an increased lactic acid as well as an increased white blood cell count. His right leg was warm swollen and tender appearing as a cellulitis.of note patient had a lower sugar many ultrasound done in September 2015 negative for DVTs. There was noted to be a lot of swelling on the ultrasound. Upon further chart review. As it appears the patient was also admitted in September 2015 for a questionable right lower extremity cellulitis. The cellulitis improved on IV antibiotics. Patient is currently febrile.per ER records appears he vomited 1.   Consultants:  none  Procedures:  none  Antibiotics:  Vancomycin/Zosyn 11/16  Cefazolin 11/16  HPI/Subjective: Patient endorses diarrhea and nausea, however RN states that the patient is currently incontinent of stool and there is no diarrhea  Objective: Filed Vitals:   04/05/14 2323 04/06/14 0333 04/06/14 0729 04/06/14 0900  BP: 155/68 144/77 136/78   Pulse: 95 117 91 109  Temp: 98.3 F (36.8 C) 98.2 F (36.8 C) 97.4 F (36.3 C)   TempSrc: Oral Oral Oral   Resp: _0 Height:      Weight:      SpO2: 95% 97% 96% 95%    Intake/Output Summary (Last 24 hours) at 04/06/14 1105 Last data filed at 04/06/14 0900  Gross per 24 hour  Intake   1460 ml  Output   1176 ml  Net    284 ml    Exam:  General: alert & oriented x 3 In NAD  Cardiovascular: RRR, nl S1 s2  Respiratory: Decreased breath sounds at the bases, scattered rhonchi, no crackles  Abdomen: soft +BS NT/ND, no masses palpable  Extremities: bilateral lower extremity edema and erythema, right greater than left      Data Reviewed: Basic Metabolic Panel:  Recent Labs Lab 04/02/14 0226 04/03/14 0257 04/04/14 0317 04/05/14 0305 04/06/14 0249  NA 141 137 133* 141 140  K 4.7 4.6 4.8  4.6 4.7  CL 105 102 100 105 105  CO2 _1 GLUCOSE 94 97 98 111* 116*  BUN 33* 39* 43* 41* 37*  CREATININE 2.22* 2.40* 2.44* 2.27* 2.13*  CALCIUM 9.2 9.0 9.1 8.9 9.0    Liver Function Tests:  Recent Labs Lab 03/31/14 0350 04/01/14 0230 04/02/14 0226  AST _2 ALT _3 ALKPHOS 101 122* 136*  BILITOT 0.5 0.5 0.5  PROT 5.8* 6.1 6.0  ALBUMIN 2.2* 2.0* 1.9*   No results for input(s): LIPASE, AMYLASE in the last 168 hours. No results for input(s): AMMONIA in the last 168 hours.  CBC:  Recent Labs Lab 04/02/14 0226 04/03/14 0257 04/04/14 0317 04/05/14 0305 04/06/14 0249  WBC 28.8* 32.7* 34.0* 35.0* 31.7*  NEUTROABS 19.8* 24.5* 28.9* 29.8* 26.9*  HGB 9.1* 7.6* 8.1* 8.3* 8.4*  HCT 29.5* 23.7* 25.5* 26.6* 26.6*  MCV 81.9 80.6 80.2 80.1 80.6  PLT PLATELET CLUMPS NOTED ON SMEAR, COUNT APPEARS ADEQUATE PLATELET CLUMPS NOTED ON SMEAR, COUNT APPEARS ADEQUATE 172 230 338    Cardiac Enzymes:  Recent Labs Lab 04/01/14 0855 04/01/14 1420 04/01/14 1945  TROPONINI <0.30 <0.30 <0.30   BNP (last 3 results)  Recent Labs  12/09/13 0751 01/25/14 1115 03/29/14 1549  PROBNP 5630.0* 828.2* 6628.0*  CBG: No results for input(s): GLUCAP in the last 168 hours.  Recent Results (from the past 240 hour(s))  Blood culture (routine x 2)     Status: None   Collection Time: 03/29/14  3:48 PM  Result Value Ref Range Status   Specimen Description BLOOD ARM RIGHT  Final   Special Requests BOTTLES DRAWN AEROBIC AND ANAEROBIC 5CC  Final   Culture  Setup Time   Final    03/30/2014 01:30 Performed at [facility]    Culture   Final    NO GROWTH 5 DAYS Performed at [facility]    Report Status 04/05/2014 FINAL  Final  Blood culture (routine x 2)     Status: None   Collection Time: 03/29/14  5:39 PM  Result Value Ref Range Status   Specimen Description BLOOD RIGHT ARM  Final   Special Requests BOTTLES DRAWN AEROBIC AND ANAEROBIC 5CC  Final    Culture  Setup Time   Final    03/30/2014 01:30 Performed at [facility]    Culture   Final    NO GROWTH 5 DAYS Performed at [facility]    Report Status 04/05/2014 FINAL  Final  MRSA PCR Screening     Status: None   Collection Time: 03/29/14  9:25 PM  Result Value Ref Range Status   MRSA by PCR NEGATIVE NEGATIVE Final    Comment:        The GeneXpert MRSA Assay (FDA approved for NASAL specimens only), is one component of a comprehensive MRSA colonization surveillance program. It is not intended to diagnose MRSA infection nor to guide or monitor treatment for MRSA infections.   Culture, blood (routine x 2)     Status: None (Preliminary result)   Collection Time: 03/31/14  6:36 PM  Result Value Ref Range Status   Specimen Description BLOOD LEFT HAND  Final   Special Requests BOTTLES DRAWN AEROBIC AND ANAEROBIC 5CC  Final   Culture  Setup Time   Final    04/01/2014 01:38 Performed at [facility]    Culture   Final           BLOOD CULTURE RECEIVED NO GROWTH TO DATE CULTURE WILL BE HELD FOR 5 DAYS BEFORE ISSUING A FINAL NEGATIVE REPORT Performed at [facility]    Report Status PENDING  Incomplete  Culture, blood (routine x 2)     Status: None (Preliminary result)   Collection Time: 03/31/14  6:41 PM  Result Value Ref Range Status   Specimen Description BLOOD RIGHT ARM  Final   Special Requests BOTTLES DRAWN AEROBIC AND ANAEROBIC 5CC  Final   Culture  Setup Time   Final    04/01/2014 01:38 Performed at [facility]    Culture   Final           BLOOD CULTURE RECEIVED NO GROWTH TO DATE CULTURE WILL BE HELD FOR 5 DAYS BEFORE ISSUING A FINAL NEGATIVE REPORT Performed at [facility]    Report Status PENDING  Incomplete     Studies: Mr Tibia Fibula Right Wo Contrast  04/01/2014   CLINICAL DATA:  Leg swelling. Cellulitis. Abscess. Initial encounter.  EXAM: MRI OF THE RIGHT FOREFOOT WITHOUT CONTRAST; MRI OF LOWER  RIGHT EXTREMITY WITHOUT CONTRAST  TECHNIQUE: Multiplanar, multisequence MR imaging was performed. No intravenous contrast was administered.  COMPARISON:  No radiographs for comparison.  FINDINGS: Abnormal bone marrow signal is present in the tibia and fibula. Bone  marrow signal shows heterogenous marrow. This is a nonspecific finding most commonly associated with obesity, anemia, cigarette smoking or chronic disease.  There is diffuse subcutaneous infiltration circumferentially around the RIGHT leg, most compatible with cellulitis in the appropriate clinical setting but nonspecific. Fatty atrophy of the inferior medial head of the gastrocnemius is present. This is superimposed on mild diffuse atrophy of the leg. There is no deep soft tissue abscess. Edema of the muscular compartments may represent dependent or reactive edema. Infectious myositis is considered unlikely.  Evaluation of the foot shows similar findings with more prominent edema anterior to the ankle and in the dorsum of the foot. Minimal ankle effusion. Subtalar joints appear within normal limits. The Achilles tendon, posterior medial and posterior lateral tendon groups appear intact. No findings to suggest osteomyelitis. Hindfoot bones appear within normal limits.  IMPRESSION: Diffuse edema of the leg and foot, which is nonspecific but can represent cellulitis, dependent edema or lymphedema. No osteomyelitis or abscess.   Electronically Signed   By: Dereck Ligas M.D.   On: 04/01/2014 12:45   Mr Foot Right Wo Contrast  04/01/2014   CLINICAL DATA:  Leg swelling. Cellulitis. Abscess. Initial encounter.  EXAM: MRI OF THE RIGHT FOREFOOT WITHOUT CONTRAST; MRI OF LOWER RIGHT EXTREMITY WITHOUT CONTRAST  TECHNIQUE: Multiplanar, multisequence MR imaging was performed. No intravenous contrast was administered.  COMPARISON:  No radiographs for comparison.  FINDINGS: Abnormal bone marrow signal is present in the tibia and fibula. Bone marrow signal shows  heterogenous marrow. This is a nonspecific finding most commonly associated with obesity, anemia, cigarette smoking or chronic disease.  There is diffuse subcutaneous infiltration circumferentially around the RIGHT leg, most compatible with cellulitis in the appropriate clinical setting but nonspecific. Fatty atrophy of the inferior medial head of the gastrocnemius is present. This is superimposed on mild diffuse atrophy of the leg. There is no deep soft tissue abscess. Edema of the muscular compartments may represent dependent or reactive edema. Infectious myositis is considered unlikely.  Evaluation of the foot shows similar findings with more prominent edema anterior to the ankle and in the dorsum of the foot. Minimal ankle effusion. Subtalar joints appear within normal limits. The Achilles tendon, posterior medial and posterior lateral tendon groups appear intact. No findings to suggest osteomyelitis. Hindfoot bones appear within normal limits.  IMPRESSION: Diffuse edema of the leg and foot, which is nonspecific but can represent cellulitis, dependent edema or lymphedema. No osteomyelitis or abscess.   Electronically Signed   By: Dereck Ligas M.D.   On: 04/01/2014 12:45   Nm Pulmonary Perf And Vent  03/31/2014   CLINICAL DATA:  Shortness of breath  EXAM: NUCLEAR MEDICINE VENTILATION - PERFUSION LUNG SCAN  TECHNIQUE: Ventilation images were obtained in multiple projections using inhaled aerosol technetium 99 M DTPA. Perfusion images were obtained in multiple projections after intravenous injection of Tc-5mMAA.  RADIOPHARMACEUTICALS:  40 mCi Tc-921mTPA aerosol and 6 mCi Tc-9931mA  COMPARISON:  None.  FINDINGS: Ventilation: Only limited ventilation images were obtained. They demonstrate no definitive ventilation defect. Patient could not tolerate laying on the table.  Perfusion: Limited per fusion views were obtained and demonstrate no perfusion defect. Again full imaging could not be performed due to  patient's intolerance of positioning.  IMPRESSION: Extremely limited exam although no perfusion defect to suggest pulmonary embolism is noted.   Electronically Signed   By: MarInez CatalinaD.   On: 03/31/2014 14:25   [place]  04/03/2014   CLINICAL  DATA:  Renal failure, personal history of coronary artery disease, hypertension, CHF, type 2 diabetes, acute leukemia  EXAM: RENAL/URINARY TRACT ULTRASOUND COMPLETE  COMPARISON:  None  FINDINGS: Right Kidney:  Length: 12.8 cm. Cortical thinning. Upper cortical echogenicity. Hypoechoic nodule at inferior pole question minimally complicated cyst versus simple cyst with artifacts. No additional mass, hydronephrosis or shadowing calcification.  Left Kidney:  Length: 11.6 cm. Cortical thinning. Increased cortical echogenicity. No mass, hydronephrosis or shadowing calcification.  Bladder:  Large fluid containing structure with loculations in pelvis, suspect representing a large bladder with diverticula and trabeculations though cystic pelvic mass not completely excluded. Patient reportedly has a Foley catheter but this is not visualized.  Incidentally noted splenomegaly, 21.8 cm length with calculated volume 1260 mL.  IMPRESSION: Atrophic kidneys with question cyst mid inferior pole RIGHT kidney, uncertain if complicated versus simple with artifacts.  No evidence of hydronephrosis.  Large anechoic structure with multiple loculations in pelvis suspect representing a distended urinary bladder with multiple diverticula and bladder wall trabeculations though a cystic pelvic mass not entirely excluded since the patient's indwelling Foley catheter is not localized ; noncontrast CT of the pelvis recommended to assess.  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   Electronically Signed   By: Lavonia Dana M.D.   On: 04/03/2014 19:59   Dg Chest Port 1 View  03/31/2014   CLINICAL DATA:  73 year old male with shortness of breath.  EXAM: PORTABLE CHEST - 1 VIEW   COMPARISON:  03/29/2014 and prior chest radiographs dating back to 08/04/2004  FINDINGS: The cardiomediastinal silhouette is unchanged.  Pulmonary vascular congestion and probable interstitial edema again noted.  Bilateral lower lung atelectasis/airspace disease again noted.  Increased bony density is again noted.  IMPRESSION: Little interval change since prior study with probable pulmonary edema and bilateral lower lung atelectasis/ airspace disease.   Electronically Signed   By: Hassan Rowan M.D.   On: 03/31/2014 14:24   Dg Chest Portable 1 View  03/29/2014   CLINICAL DATA:  Cellulitis. Cough and congestion. Shortness of breath. Chest pain. Nausea, vomiting, fever. History of hypertension and smoking.  EXAM: PORTABLE CHEST - 1 VIEW  COMPARISON:  01/26/2014  FINDINGS: Shallow inflation. Heart is enlarged. Mild pulmonary edema. There are no focal consolidations. Although not well evaluated on this portable view of the chest, the bones have a somewhat mottled lucent appearance raising the question of metabolic or metastatic disorder.  IMPRESSION: 1. Cardiomegaly and pulmonary edema. 2. Question of diffuse osseous changes. Recommend correlation with history and lab values. Consider further imaging if needed.   Electronically Signed   By: Shon Hale M.D.   On: 03/29/2014 18:01    Scheduled Meds: .  ceFAZolin (ANCEF) IV  1 g Intravenous 3 times per day  . feeding supplement (GLUCERNA SHAKE)  237 mL Oral BID BM  . feeding supplement (PRO-STAT SUGAR FREE 64)  30 mL Oral BID BM  . furosemide  40 mg Oral BID  . heparin  5,000 Units Subcutaneous 3 times per day  . hydrocerin   Topical Daily  . pantoprazole  40 mg Oral Daily  . sodium chloride  3 mL Intravenous Q12H   Continuous Infusions: . sodium chloride 10 mL/hr at 04/05/14 1000    Active Problems:   Obstructive sleep apnea   Cellulitis   Leukocytosis   Sepsis   Morbid obesity due to excess calories   Malnutrition of moderate degree   Cellulitis  of right lower leg   Venous  stasis dermatitis of both lower extremities    Time spent: 40 minutes.   [facility]  [facility] Pager [phone]. If 7PM-7AM, please contact night-coverage at [email or web address], password [facility] 04/06/2014, 11:05 AM  LOS: 8 days

## 2014-04-06 NOTE — Progress Notes (Signed)
Full note to follow:  I spoke today with Juan [name] and his son and daughter at bedside. We spoke vaguely about his situation with possible leukemia as he is resistant to share information with his family. However, Juan [name] does not relay information correctly as he speaks with me according to what has been discussed with him. His memory also seems impaired. Family tells me that he has always been very private and they are very concerned about his understanding of his medical situation and decisions. We were unable to make much progress in our conversation. Juan [name] keeps repeatedly refers to the fact that he is moving to a new room. He is now saying that he wants a work-up from oncology and treatment options but he repeatedly tells me that he has different doctors telling him different things and he doesn't know what to believe. He tells me that one of his doctors told him not to worry about it and not to let anyone do any more testing on him. I tried to explain that he has more than one medical problem and that his doctors may be referring to different issues he has. He does not seem to understand or remember the information that has been shared with him. I spoke with Dr. Allyson Sabal and recommend an evaluation to determine if he has capacity for decision making as I doubt this after our conversation. I will follow up Monday. Please call [phone] for acute needs over the weekend.  Vinie Sill, NP Palliative Medicine Team Pager # [phone] (M-F 8a-5p) Team Phone # [phone] (Nights/Weekends)

## 2014-04-06 NOTE — Progress Notes (Signed)
heparin

## 2014-04-07 DIAGNOSIS — D7289 Other specified disorders of white blood cells: Secondary | ICD-10-CM

## 2014-04-07 DIAGNOSIS — Z008 Encounter for other general examination: Secondary | ICD-10-CM

## 2014-04-07 LAB — CBC WITH DIFFERENTIAL/PLATELET
BASOS PCT: 1 % (ref 0–1)
Band Neutrophils: 43 % — ABNORMAL HIGH (ref 0–10)
Basophils Absolute: 0.3 10*3/uL — ABNORMAL HIGH (ref 0.0–0.1)
Blasts: 0 %
EOS ABS: 1.1 10*3/uL — AB (ref 0.0–0.7)
EOS PCT: 4 % (ref 0–5)
HCT: 25.1 % — ABNORMAL LOW (ref 39.0–52.0)
HEMOGLOBIN: 8 g/dL — AB (ref 13.0–17.0)
Lymphocytes Relative: 20 % (ref 12–46)
Lymphs Abs: 5.4 10*3/uL — ABNORMAL HIGH (ref 0.7–4.0)
MCH: 25.8 pg — ABNORMAL LOW (ref 26.0–34.0)
MCHC: 31.9 g/dL (ref 30.0–36.0)
MCV: 81 fL (ref 78.0–100.0)
Metamyelocytes Relative: 8 %
Monocytes Absolute: 0.8 10*3/uL (ref 0.1–1.0)
Monocytes Relative: 3 % (ref 3–12)
Myelocytes: 1 %
NEUTROS ABS: 19.5 10*3/uL — AB (ref 1.7–7.7)
NEUTROS PCT: 20 % — AB (ref 43–77)
NRBC: 0 /100{WBCs}
PLATELETS: 320 10*3/uL (ref 150–400)
Promyelocytes Absolute: 0 %
RBC: 3.1 MIL/uL — ABNORMAL LOW (ref 4.22–5.81)
RDW: 19.5 % — ABNORMAL HIGH (ref 11.5–15.5)
WBC Morphology: INCREASED
WBC: 27.1 10*3/uL — AB (ref 4.0–10.5)

## 2014-04-07 LAB — CULTURE, BLOOD (ROUTINE X 2)
Culture: NO GROWTH
Culture: NO GROWTH

## 2014-04-07 LAB — BASIC METABOLIC PANEL
Anion gap: 12 (ref 5–15)
BUN: 35 mg/dL — ABNORMAL HIGH (ref 6–23)
CALCIUM: 8.8 mg/dL (ref 8.4–10.5)
CO2: 23 meq/L (ref 19–32)
CREATININE: 1.98 mg/dL — AB (ref 0.50–1.35)
Chloride: 105 mEq/L (ref 96–112)
GFR calc Af Amer: 37 mL/min — ABNORMAL LOW (ref >90)
GFR calc non Af Amer: 32 mL/min — ABNORMAL LOW (ref >90)
GLUCOSE: 104 mg/dL — AB (ref 70–99)
Potassium: 4.9 mEq/L (ref 3.7–5.3)
Sodium: 140 mEq/L (ref 137–147)

## 2014-04-07 MED ORDER — SALINE SPRAY 0.65 % NA SOLN
1.0000 | NASAL | Status: DC | PRN
  Administered 2014-04-09: 1 via NASAL
  Filled 2014-04-07: qty 44

## 2014-04-07 MED ORDER — ZOLPIDEM TARTRATE 5 MG PO TABS: 10.0000 mg | ORAL_TABLET | Freq: Every day | ORAL | Status: DC

## 2014-04-07 MED ORDER — ZOLPIDEM TARTRATE 5 MG PO TABS
5.0000 mg | ORAL_TABLET | Freq: Every day | ORAL | Status: DC
  Administered 2014-04-07 – 2014-04-09 (×3): 5 mg via ORAL
  Filled 2014-04-07 (×3): qty 1

## 2014-04-07 MED ORDER — OXYMETAZOLINE HCL 0.05 % NA SOLN
2.0000 | Freq: Two times a day (BID) | NASAL | Status: DC
  Administered 2014-04-07 – 2014-04-10 (×7): 2 via NASAL
  Filled 2014-04-07 (×2): qty 15

## 2014-04-07 NOTE — Consult Note (Signed)
[facility] Face-to-Face Psychiatry Consult   Reason for Consult:  Capacity eval Referring Physician:  Dr. Nunzio Cory is an 73 y.o. male. Total Time spent with patient: 45 minutes  Assessment: AXIS I:  none AXIS II:  Deferred AXIS III:   Past Medical History  Diagnosis Date  . CAD (coronary artery disease)     dx elsewheer in past, no documentation. Non-ischemic myovue 2005/08/17  . CHF (congestive heart failure)     dx elsewhere, no documentation. Normal EF by previous echo. Trival AS needs f/u ECHO by 2010-08-18  . Hypertension   . Sleep apnea, obstructive     at some point used CPAP, was d/c  years ago  . Anemia   . History of thrombocytosis   . Allergic rhinitis   . Edema     R>L leg, u/s 5-12 neg for DVT  . Hemorrhoid   . Type II diabetes mellitus   . Sinus congestion   . Migraine     "once/wk at least" (07/11/2013)  . Shortness of breath    AXIS IV:  medical problems AXIS V:  51-60 moderate symptoms  Plan:  No evidence of imminent risk to self or others at present.   Patient does not meet criteria for psychiatric inpatient admission. Supportive therapy provided about ongoing stressors. Discussed crisis plan, support from social network, calling 911, coming to the Emergency Department, and calling Suicide Hotline. Pt does appear to have capacity to make medical decisions at this time.  Subjective:   Juan [name] is a 73 y.o. male patient admitted with "passing out at home". Pt was interviewed, chart reviewed. Pt reports being unable to walk at home. Pt reports currently receiving antibiotics in his leg for swelling (cellulitis), and then plans to soak them. Mood is "fair". Sleep poor for 1 wk, but pt is unsure why. Appetite poor recently, because he has been nauseated. Pt wants to get well soon (by having a short-term nursing home stay), so he can eventually go home. Pt denies SI/HI/AVH. Pt denies any previous psychiatric history. No history of suicide attempts/psychiatric  hospitalizations/psychotropic medications. No history of depression/mania/anxiety/psychosis. No history of drug or alcohol problems. He was accurately able to state his DOB. He could not recall the month, but knew the date was the 21st, and the year was 2013/08/17. He knew he was at [facility] in [place], [place].  His wife died in August 17, 2005. He has 3 daughters, and many grandchildren. Prior to admission, he was living with his son. However, he understands that he cannot be immediately discharged to his son's house. His son is sick, and lies in bed most of the day. He realizes that his son is not able to take care of him. Pt realizes that he (himself) is sick, and needs at least a short-term nursing home stay to recover. He demonstrated good orientation/concentration/memory/recall/abstraction/insight/judgement during the interview.  HPI:  See above HPI Elements:   N/A  Past Psychiatric History: Past Medical History  Diagnosis Date  . CAD (coronary artery disease)     dx elsewheer in past, no documentation. Non-ischemic myovue 17-Aug-2005  . CHF (congestive heart failure)     dx elsewhere, no documentation. Normal EF by previous echo. Trival AS needs f/u ECHO by 18-Aug-2010  . Hypertension   . Sleep apnea, obstructive     at some point used CPAP, was d/c  years ago  . Anemia   . History of thrombocytosis   . Allergic rhinitis   .  Edema     R>L leg, u/s 5-12 neg for DVT  . Hemorrhoid   . Type II diabetes mellitus   . Sinus congestion   . Migraine     "once/wk at least" (07/11/2013)  . Shortness of breath     reports that he quit smoking about 47 years ago. His smoking use included Cigarettes. He has a 3 pack-year smoking history. He has never used smokeless tobacco. He reports that he does not drink alcohol or use illicit drugs. Family History  Problem Relation Age of Onset  . Colon cancer Neg Hx   . Prostate cancer Neg Hx   . Heart attack Neg Hx   . Diabetes Neg Hx   . Schizophrenia Son       Living Arrangements: Children   Abuse/Neglect [facility]) Physical Abuse: Denies Verbal Abuse: Denies Sexual Abuse: Denies Allergies:  No Known Allergies  ACT Assessment Complete:  Yes:    Educational Status    Risk to Self: Risk to self with the past 6 months Is patient at risk for suicide?: No  Risk to Others:    Abuse: Abuse/Neglect Assessment (Assessment to be complete while patient is alone) Physical Abuse: Denies Verbal Abuse: Denies Sexual Abuse: Denies Exploitation of patient/patient's resources: Denies Self-Neglect: Denies  Prior Inpatient Therapy:    Prior Outpatient Therapy:    Additional Information:                    Objective: Blood pressure 140/79, pulse 91, temperature 97.6 F (36.4 C), temperature source Oral, resp. rate 18, height $RemoveBe'6\' 2"'SdsWeNyUZ$  (1.88 m), weight 118.5 kg (261 lb 3.9 oz), SpO2 100 %.Body mass index is 33.53 kg/(m^2). Results for orders placed or performed during the hospital encounter of 03/29/14 (from the past 72 hour(s))  CBC with Differential     Status: Abnormal   Collection Time: 04/05/14  3:05 AM  Result Value Ref Range   WBC 35.0 (H) 4.0 - 10.5 K/uL    Comment: WHITE COUNT CONFIRMED ON SMEAR   RBC 3.32 (L) 4.22 - 5.81 MIL/uL   Hemoglobin 8.3 (L) 13.0 - 17.0 g/dL   HCT 26.6 (L) 39.0 - 52.0 %   MCV 80.1 78.0 - 100.0 fL   MCH 25.0 (L) 26.0 - 34.0 pg   MCHC 31.2 30.0 - 36.0 g/dL   RDW 19.6 (H) 11.5 - 15.5 %   Platelets 230 150 - 400 K/uL    Comment: PLATELET COUNT CONFIRMED BY SMEAR   Neutrophils Relative % 29 (L) 43 - 77 %   Lymphocytes Relative 9 (L) 12 - 46 %   Monocytes Relative 2 (L) 3 - 12 %   Eosinophils Relative 3 0 - 5 %   Basophils Relative 0 0 - 1 %   Band Neutrophils 40 (H) 0 - 10 %   Metamyelocytes Relative 8 %   Myelocytes 5 %   Promyelocytes Absolute 3 %   Blasts 1 %   nRBC 0 0 /100 WBC   Neutro Abs 29.8 (H) 1.7 - 7.7 K/uL   Lymphs Abs 3.2 0.7 - 4.0 K/uL   Monocytes Absolute 0.7 0.1 - 1.0 K/uL   Eosinophils  Absolute 1.1 (H) 0.0 - 0.7 K/uL   Basophils Absolute 0.0 0.0 - 0.1 K/uL   RBC Morphology TEARDROP CELLS     Comment: ELLIPTOCYTES POLYCHROMASIA PRESENT    WBC Morphology PLASMACYTOID LYMPHS     Comment: MARKED LEFT SHIFT (>5% METAS,MYELOS AND PROS, OCC BLAST NOTED) TOXIC GRANULATION  Smear Review LARGE PLATELETS PRESENT   Basic metabolic panel     Status: Abnormal   Collection Time: 04/05/14  3:05 AM  Result Value Ref Range   Sodium 141 137 - 147 mEq/L    Comment: DELTA CHECK NOTED   Potassium 4.6 3.7 - 5.3 mEq/L   Chloride 105 96 - 112 mEq/L   CO2 23 19 - 32 mEq/L   Glucose, Bld 111 (H) 70 - 99 mg/dL   BUN 41 (H) 6 - 23 mg/dL   Creatinine, Ser 2.27 (H) 0.50 - 1.35 mg/dL   Calcium 8.9 8.4 - 10.5 mg/dL   GFR calc non Af Amer 27 (L) >90 mL/min   GFR calc Af Amer 31 (L) >90 mL/min    Comment: (NOTE) The eGFR has been calculated using the CKD EPI equation. This calculation has not been validated in all clinical situations. eGFR's persistently <90 mL/min signify possible Chronic Kidney Disease.    Anion gap 13 5 - 15  CBC with Differential     Status: Abnormal   Collection Time: 04/06/14  2:49 AM  Result Value Ref Range   WBC 31.7 (H) 4.0 - 10.5 K/uL    Comment: WHITE COUNT CONFIRMED ON SMEAR   RBC 3.30 (L) 4.22 - 5.81 MIL/uL   Hemoglobin 8.4 (L) 13.0 - 17.0 g/dL   HCT 26.6 (L) 39.0 - 52.0 %   MCV 80.6 78.0 - 100.0 fL   MCH 25.5 (L) 26.0 - 34.0 pg   MCHC 31.6 30.0 - 36.0 g/dL   RDW 20.5 (H) 11.5 - 15.5 %   Platelets 338 150 - 400 K/uL    Comment: SPECIMEN CHECKED FOR CLOTS REPEATED TO VERIFY PLATELET COUNT CONFIRMED BY SMEAR    Neutrophils Relative % 45 43 - 77 %   Lymphocytes Relative 10 (L) 12 - 46 %   Monocytes Relative 3 3 - 12 %   Eosinophils Relative 2 0 - 5 %   Basophils Relative 0 0 - 1 %   Band Neutrophils 26 (H) 0 - 10 %   Metamyelocytes Relative 5 %   Myelocytes 5 %   Promyelocytes Absolute 4 %   Blasts 0 %   nRBC 0 0 /100 WBC   Neutro Abs 26.9 (H)  1.7 - 7.7 K/uL   Lymphs Abs 3.2 0.7 - 4.0 K/uL   Monocytes Absolute 1.0 0.1 - 1.0 K/uL   Eosinophils Absolute 0.6 0.0 - 0.7 K/uL   Basophils Absolute 0.0 0.0 - 0.1 K/uL   RBC Morphology POLYCHROMASIA PRESENT     Comment: TEARDROP CELLS ELLIPTOCYTES    WBC Morphology INCREASED BANDS (>20% BANDS)     Comment: MILD LEFT SHIFT (1-5% METAS, OCC MYELO, OCC BANDS) MARKED LEFT SHIFT (>5% METAS,MYELOS AND PROS, OCC BLAST NOTED)    Smear Review LARGE PLATELETS PRESENT   Basic metabolic panel     Status: Abnormal   Collection Time: 04/06/14  2:49 AM  Result Value Ref Range   Sodium 140 137 - 147 mEq/L   Potassium 4.7 3.7 - 5.3 mEq/L   Chloride 105 96 - 112 mEq/L   CO2 23 19 - 32 mEq/L   Glucose, Bld 116 (H) 70 - 99 mg/dL   BUN 37 (H) 6 - 23 mg/dL   Creatinine, Ser 2.13 (H) 0.50 - 1.35 mg/dL   Calcium 9.0 8.4 - 10.5 mg/dL   GFR calc non Af Amer 29 (L) >90 mL/min   GFR calc Af Amer 34 (L) >90 mL/min  Comment: (NOTE) The eGFR has been calculated using the CKD EPI equation. This calculation has not been validated in all clinical situations. eGFR's persistently <90 mL/min signify possible Chronic Kidney Disease.    Anion gap 12 5 - 15  CBC with Differential     Status: Abnormal   Collection Time: 04/07/14  5:45 AM  Result Value Ref Range   WBC 27.1 (H) 4.0 - 10.5 K/uL    Comment: WHITE COUNT CONFIRMED ON SMEAR   RBC 3.10 (L) 4.22 - 5.81 MIL/uL   Hemoglobin 8.0 (L) 13.0 - 17.0 g/dL   HCT 25.1 (L) 39.0 - 52.0 %   MCV 81.0 78.0 - 100.0 fL   MCH 25.8 (L) 26.0 - 34.0 pg   MCHC 31.9 30.0 - 36.0 g/dL   RDW 19.5 (H) 11.5 - 15.5 %   Platelets 320 150 - 400 K/uL   Neutrophils Relative % 20 (L) 43 - 77 %   Lymphocytes Relative 20 12 - 46 %   Monocytes Relative 3 3 - 12 %   Eosinophils Relative 4 0 - 5 %   Basophils Relative 1 0 - 1 %   Band Neutrophils 43 (H) 0 - 10 %   Metamyelocytes Relative 8 %   Myelocytes 1 %   Promyelocytes Absolute 0 %   Blasts 0 %   nRBC 0 0 /100 WBC   Neutro  Abs 19.5 (H) 1.7 - 7.7 K/uL   Lymphs Abs 5.4 (H) 0.7 - 4.0 K/uL   Monocytes Absolute 0.8 0.1 - 1.0 K/uL   Eosinophils Absolute 1.1 (H) 0.0 - 0.7 K/uL   Basophils Absolute 0.3 (H) 0.0 - 0.1 K/uL   RBC Morphology POLYCHROMASIA PRESENT     Comment: ELLIPTOCYTES TEARDROP CELLS    WBC Morphology INCREASED BANDS (>20% BANDS)     Comment: MILD LEFT SHIFT (1-5% METAS, OCC MYELO, OCC BANDS) ATYPICAL LYMPHOCYTES   Basic metabolic panel     Status: Abnormal   Collection Time: 04/07/14  5:45 AM  Result Value Ref Range   Sodium 140 137 - 147 mEq/L   Potassium 4.9 3.7 - 5.3 mEq/L   Chloride 105 96 - 112 mEq/L   CO2 23 19 - 32 mEq/L   Glucose, Bld 104 (H) 70 - 99 mg/dL   BUN 35 (H) 6 - 23 mg/dL   Creatinine, Ser 1.98 (H) 0.50 - 1.35 mg/dL   Calcium 8.8 8.4 - 10.5 mg/dL   GFR calc non Af Amer 32 (L) >90 mL/min   GFR calc Af Amer 37 (L) >90 mL/min    Comment: (NOTE) The eGFR has been calculated using the CKD EPI equation. This calculation has not been validated in all clinical situations. eGFR's persistently <90 mL/min signify possible Chronic Kidney Disease.    Anion gap 12 5 - 15   Labs are reviewed and are pertinent for (see medical notes).  Current Facility-Administered Medications  Medication Dose Route Frequency Provider Last Rate Last Dose  . 0.9 %  sodium chloride infusion   Intravenous Continuous Reyne Dumas, MD 10 mL/hr at 04/05/14 1000    . acetaminophen (TYLENOL) tablet 650 mg  650 mg Oral Q6H PRN Geradine Girt, DO   650 mg at 04/07/14 0003   Or  . acetaminophen (TYLENOL) suppository 650 mg  650 mg Rectal Q6H PRN Geradine Girt, DO      . alum & mag hydroxide-simeth (MAALOX/MYLANTA) 200-200-20 MG/5ML suspension 15 mL  15 mL Oral Q6H PRN Reyne Dumas, MD  15 mL at 04/06/14 1708  . ceFAZolin (ANCEF) IVPB 1 g/50 mL premix  1 g Intravenous 3 times per day Carlyle Basques, MD   1 g at 04/07/14 1500  . feeding supplement (GLUCERNA SHAKE) (GLUCERNA SHAKE) liquid 237 mL  237 mL Oral  BID BM Jenifer A Williams, RD   237 mL at 04/07/14 1500  . feeding supplement (PRO-STAT SUGAR FREE 64) liquid 30 mL  30 mL Oral BID BM Jenifer A Williams, RD   30 mL at 04/07/14 1500  . furosemide (LASIX) tablet 40 mg  40 mg Oral BID Elmarie Shiley, MD   40 mg at 04/07/14 1734  . gi cocktail (Maalox,Lidocaine,Donnatal)  30 mL Oral TID PRN Reyne Dumas, MD      . heparin injection 5,000 Units  5,000 Units Subcutaneous 3 times per day Reyne Dumas, MD   5,000 Units at 04/07/14 1500  . hydrocerin (EUCERIN) cream   Topical Daily Reyne Dumas, MD      . metoprolol (LOPRESSOR) injection 2.5 mg  2.5 mg Intravenous Q6H PRN Reyne Dumas, MD   2.5 mg at 04/03/14 2215  . morphine 2 MG/ML injection 2 mg  2 mg Intravenous Q4H PRN Geradine Girt, DO   2 mg at 04/07/14 0818  . ondansetron (ZOFRAN) injection 4 mg  4 mg Intravenous Q6H PRN Gardiner Barefoot, NP   4 mg at 04/06/14 0202  . oxymetazoline (AFRIN) 0.05 % nasal spray 2 spray  2 spray Each Nare BID Reyne Dumas, MD   2 spray at 04/07/14 1018  . pantoprazole (PROTONIX) EC tablet 40 mg  40 mg Oral Daily Reyne Dumas, MD   40 mg at 04/07/14 1018  . sodium chloride (OCEAN) 0.65 % nasal spray 1 spray  1 spray Each Nare PRN Reyne Dumas, MD      . sodium chloride 0.9 % injection 3 mL  3 mL Intravenous Q12H Jessica U Vann, DO   3 mL at 04/07/14 1000  . zolpidem (AMBIEN) tablet 5 mg  5 mg Oral QHS Reyne Dumas, MD        Psychiatric Specialty Exam: Physical Exam  ROS  Blood pressure 140/79, pulse 91, temperature 97.6 F (36.4 C), temperature source Oral, resp. rate 18, height $RemoveBe'6\' 2"'DiVCcnMnV$  (1.88 m), weight 118.5 kg (261 lb 3.9 oz), SpO2 100 %.Body mass index is 33.53 kg/(m^2).  General Appearance: Fairly Groomed  Engineer, water::  Fair  Speech:  Normal Rate  Volume:  Normal  Mood:  Euthymic  Affect:  Congruent and Full Range  Thought Process:  Goal Directed  Orientation:  Full (Time, Place, and Person)  Thought Content:  Negative  Suicidal Thoughts:  No   Homicidal Thoughts:  No  Memory:  Immediate;   Fair Recent;   Fair Remote;   Fair  Judgement:  Fair  Insight:  Fair  Psychomotor Activity:  Decreased  Concentration:  Fair  Recall:  [employer] of Knowledge:Fair  Language: Fair  Akathisia:  Negative  Handed:  Right  AIMS (if indicated):     Assets:  Desire for Improvement Resilience Social Support  Sleep:      Musculoskeletal: Strength & Muscle Tone: decreased Gait & Station: unsteady Patient leans: N/A  Treatment Plan Summary: Pt does appear to have capacity to make medical decisions at this time. He currently reports being agreeable to the treatment recommended by his primary medical team. He is able to weigh the risks versus benefits of treatment as well. He understands he needs  antibiotics for his cellulitis. He understands that he has multiple medical issues that need treatment. He understands that he cannot return home to his son at this time, because his son is not able to appropriately care for him at home. He is willing to go to a "nice" nursing home after discharge.Please call if any additional questions or concerns.  Dereck Leep 04/07/2014 6:50 PM

## 2014-04-07 NOTE — Evaluation (Signed)
Physical Therapy Evaluation Patient Details Name: Juan [name] MRN: [id number] DOB: Feb 25, 1941 Today's Date: 04/07/2014   History of Present Illness  73 y.o. Male admitted for cellulitis of bilateral lower extremities right greater than left. Difficulty with ambulation. In the ER the patient was found to be fairly lethargic with desaturation into the 80s, found to have elevated lactic acid and white blood cell count. Patient has had a long-standing history of recurrent leukocytosis, not on current treatment at this time due to loss of follow-up with oncology,  Pt with bi LE cellulitis. Multiple co-morbidities. pallitative and psych consult pending.   Clinical Impression  Pt adm due to above. Pt presents with decreased independence with functional mobility secondary to deficits indicated below (see PT problem list). Pt to benefit from skilled acute PT to address deficits and maximize functional mobility. Pt confabulating during PT session today and it is unclear as to support available at D/C. Palliative and psych consults are pending. Will recommend SNF for post acute rehab at this time. Pt refusing OOB activities due to pain currently but required 2 person (A) for bed mobility at to sit EOB.     Follow Up Recommendations SNF;Supervision/Assistance - 24 hour    Equipment Recommendations  Other (comment) (TBD)    Recommendations for Other Services OT consult     Precautions / Restrictions Precautions Precautions: Fall Restrictions Weight Bearing Restrictions: No      Mobility  Bed Mobility Overal bed mobility: Needs Assistance;+2 for physical assistance Bed Mobility: Rolling;Sidelying to Sit;Sit to Sidelying Rolling: Max assist Sidelying to sit: +2 for physical assistance;Mod assist;HOB elevated     Sit to sidelying: +2 for physical assistance;Max assist General bed mobility comments: (A) with pericare; pt with loose BM in bed upon arrival; nurse tech (A) with pericare as well; pt  requiring max encouragement to sit EOB; 2 person (A) and incr time for all movements; pt repeating 'wait a min, i need a breathe" throughout each session; max cues for sequencing; (A) to advance LEs off and onto bed   Transfers                 General transfer comment: pt adamantly refusing to attempt to stand today  Ambulation/Gait                Stairs            Wheelchair Mobility    Modified Rankin (Stroke Patients Only)       Balance Overall balance assessment: Needs assistance;History of Falls Sitting-balance support: Feet supported;No upper extremity supported Sitting balance-Leahy Scale: Fair Sitting balance - Comments: toelrated sitting EOB ~7 min then became very anxious and required returning to supine                                      Pertinent Vitals/Pain Pain Assessment: 0-10 Pain Score: 10-Worst pain ever Pain Location: bil LEs with touch  Pain Descriptors / Indicators: Burning;Tender Pain Intervention(s): Limited activity within patient's tolerance;Monitored during session;Repositioned;Premedicated before session    Home Living Family/patient expects to be discharged to:: Private residence Living Arrangements: Children Available Help at Discharge: Family;Available 24 hours/day Type of Home: Apartment Home Access: Level entry     Home Layout: One level Home Equipment: Cane - single point Additional Comments: Pt adamant that he is D/c with son    Prior Function Level of Independence: Independent with assistive device(s)  Comments: unclear as to accuracy of PLOF; no family present      Hand Dominance   Dominant Hand: Right    Extremity/Trunk Assessment   Upper Extremity Assessment: Defer to OT evaluation           Lower Extremity Assessment: Generalized weakness (pt tender to touch; c/o numbness in bil LEs )      Cervical / Trunk Assessment: Normal  Communication   Communication: No  difficulties  Cognition Arousal/Alertness: Awake/alert Behavior During Therapy: Anxious Overall Cognitive Status: No family/caregiver present to determine baseline cognitive functioning Area of Impairment: Attention;Memory;Safety/judgement;Problem solving;Awareness   Current Attention Level: Sustained Memory: Decreased short-term memory   Safety/Judgement: Decreased awareness of deficits;Decreased awareness of safety Awareness: Intellectual Problem Solving: Slow processing;Decreased initiation;Difficulty sequencing;Requires verbal cues;Requires tactile cues General Comments: pt very anxious and seems to perseverate; no family present to determine accuracy of information given; some information was contradictory; pt confabulating during session    General Comments General comments (skin integrity, edema, etc.): educated on importance of mobility; pt refusing OOB activities at this time     Exercises        Assessment/Plan    PT Assessment Patient needs continued PT services  PT Diagnosis Difficulty walking;Generalized weakness;Acute pain   PT Problem List Decreased strength;Decreased activity tolerance;Decreased balance;Decreased mobility;Decreased range of motion;Decreased knowledge of use of DME;Decreased safety awareness;Decreased cognition;Pain;Obesity;Impaired sensation  PT Treatment Interventions DME instruction;Gait training;Functional mobility training;Therapeutic activities;Therapeutic exercise;Balance training;Neuromuscular re-education;Cognitive remediation;Patient/family education   PT Goals (Current goals can be found in the Care Plan section) Acute Rehab PT Goals Patient Stated Goal: to just have these antibiotics work PT Goal Formulation: With patient Time For Goal Achievement: 04/14/14 Potential to Achieve Goals: Fair    Frequency Min 3X/week   Barriers to discharge   unsure as to caregiver support; no family present     Co-evaluation               End  of Session   Activity Tolerance: Patient limited by pain;Other (comment) (anxious and refusing ) Patient left: in bed;with call Ingrum/phone within reach Nurse Communication: Mobility status;Precautions         Time: 5374-8270 PT Time Calculation (min) (ACUTE ONLY): 17 min   Charges:   PT Evaluation $Initial PT Evaluation Tier I: 1 Procedure PT Treatments $Therapeutic Activity: 8-22 mins   PT G CodesGustavus Bryant, [place]  [phone] 04/07/2014, 3:46 PM

## 2014-04-07 NOTE — Progress Notes (Addendum)
[facility] PROGRESS NOTE  Juan [name] NBZ:[id number] DOB: 1941-04-09 DOA: 03/29/2014 PCP: Willow Ora, MD  Assessment/Plan: Active Problems:   Obstructive sleep apnea   Cellulitis   Leukocytosis   Sepsis   Morbid obesity due to excess calories   Malnutrition of moderate degree   Cellulitis of right lower leg   Venous stasis dermatitis of both lower extremities    Interim summary :Juan [name] is a 73 y.o. Male admitted for cellulitis of bilateral lower extremities right greater than left. Difficulty with ambulation. In the ER the patient was found to be fairly lethargic with desaturation into the 80s, found to have elevated lactic acid and white blood cell count. Patient has had a long-standing history of recurrent leukocytosis, not on current treatment at this time due to loss of follow-up with oncology,  He has had recurrent cellulitis in the past 1 year His CBC continues to show abnormalities, with a white count on admission of 17.8 now to 28.8, with the differential showing Absolute lymphocytes of 5.8, with monocytes at 1.7 and ANC of 19.8. He also has a history of anemia and thrombocytosis. His anemia is stable. He had been on Anagrelide prior to admission for his myeloproliferative disorder. He has been experiencing malaise, fatigue, over the last 2 months, short of breath on exertion, not at rest.  Oncology was consulted regarding his elevated white count.  Around August 2014, it became chronically elevated ranging from 12.4 to as high as 20.5.  He was referred to [facility], as acute leukemia was suspected. On 11/30/13 Dr. Gretel Acre consulted patient. 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 He had 3% blasts in the peripheral blood, which was confirmed by flow cytometry.  Patient refused bone marrow biopsy  Per chart notes, we was considered a good candidate for  Jakafi, but patient declined treatment at the time stating that he would follow in [place], but failed to follow up at the [facility].  Infectious disease was consulted for antibiotic management given no improvement on vancomycin and Zosyn  Assessment and plan   cellulitis/sepsis/persistent fever Currently on cefazolin per infectious disease recommendations MRI  Shows diffuse edema of the leg and the foot, no abscess switched to cefazolin 11/16 -renally dosed. Patient not known to have mrsa colonization. discontinued vanco and cefepime. 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  He refused a bone marrow biopsy. The patient is non-compliant and has very poor understanding of disease process. Earvin Hansen was recommended as a therapy. However, He failed to follow up at either Cancer Center.  The patient is now too weak for systemic treatment and oncology does not recommend repeat BM biopsy. Oncology recommends palliative care consultation, Discussed with  Dr Bertis Ruddy twice. She recommended that she has nothing to offer and that patient would need to see his oncologist in [place] after discharge.   Sinus tachycardia Was likely secondary to sepsis, blast crisis, persistent No improvement despite treatment of infection as well as hydration VQ scan low probability  bilateral lower extremity Doppler to rule out DVT,negative    Acute on chronic renal failure Creatinine has stabilized after the patient developed acute renal failure and creatinine had increased to  1.32- 2.44 Renal ultrasound does not show any hydronephrosis, it shows atrophy kidney, splenomegaly Uric acid level within normal limits, patient restarted on Lasix by  nephrology Nephrology was consulted [place] nephrology input, Dr. 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 Anagrelide discontinued on 11/15 as his platelet counts were 179 as of 11/15 Will continue to monitor.  History of CAD  - Reviewed outpatient cardiology notes-had a normal Myoview in 2007.  - Stable without any chest pain or shortness of breath  Cardiac enzymes negative   Chronic lower extremity edema, stasis dermatitis  - Chronic issue-as noted above-current exam consistent with chronic stasis dermatitis changes.  Resume Lasix  Epistaxis Likely secondary to dysfunctional platelets, although platelet count is normal Started the patient on afrin, ocean nasal spray   Malnutrition Continue Glucerna and pro stat   Code Status: full Family Communication: family updated about patient's clinical progress Disposition Plan:  Transfer to telemetry, palliative care consultation requested, patient at high risk of cardiac arrest, CODE BLUE, transfer to [place] if patient is agreeable to a DO NOT RESUSCITATE status        Consultants:  none  Procedures:  none  Antibiotics:  Vancomycin/Zosyn 11/16  Cefazolin 11/16  HPI/Subjective: Patient complains of epistaxis  Objective: Filed Vitals:   04/06/14 1231 04/06/14 1630 04/06/14 2126 04/07/14 0602  BP: 148/83 148/70 131/75 136/78  Pulse: 107 91 106 90  Temp:  98.1 F (36.7 C) 99.1 F (37.3 C) 98.7 F (37.1 C)  TempSrc:  Oral    Resp: $Remo'15 20 18 18  'JttuR$ Height:      Weight:      SpO2: 96% 96% 98% 96%    Intake/Output Summary (Last 24 hours) at 04/07/14 0959 Last data filed at 04/07/14 0241  Gross per 24 hour  Intake    543 ml  Output   1450 ml  Net   -907 ml    Exam:  General: alert & oriented x 3 In NAD  Cardiovascular: RRR, nl S1 s2  Respiratory: Decreased breath sounds at the bases, scattered rhonchi, no crackles  Abdomen: soft +BS NT/ND, no masses palpable  Extremities: bilateral lower extremity edema and erythema, right greater than left      Data Reviewed: Basic Metabolic Panel:  Recent Labs Lab 04/03/14 0257 04/04/14 0317 04/05/14 0305 04/06/14 0249 04/07/14 0545  NA 137 133* 141 140 140  K 4.6 4.8 4.6 4.7 4.9  CL 102 100 105 105 105  CO2 $Re'21 20 23 23 23  'kDu$ GLUCOSE 97 98 111* 116* 104*  BUN 39* 43* 41* 37* 35*  CREATININE 2.40* 2.44* 2.27* 2.13* 1.98*  CALCIUM 9.0 9.1 8.9 9.0 8.8    Liver Function Tests:  Recent Labs Lab 04/01/14 0230 04/02/14 0226  AST 22 18  ALT 12 12  ALKPHOS 122* 136*  BILITOT 0.5  0.5  PROT 6.1 6.0  ALBUMIN 2.0* 1.9*   No results for input(s): LIPASE, AMYLASE in the last 168 hours. No results for input(s): AMMONIA in the last 168 hours.  CBC:  Recent Labs Lab 04/03/14 0257 04/04/14 0317 04/05/14 0305 04/06/14 0249 04/07/14 0545  WBC 32.7* 34.0* 35.0* 31.7* 27.1*  NEUTROABS 24.5* 28.9* 29.8* 26.9* 19.5*  HGB 7.6* 8.1* 8.3* 8.4* 8.0*  HCT 23.7* 25.5* 26.6* 26.6* 25.1*  MCV 80.6 80.2 80.1 80.6 81.0  PLT PLATELET CLUMPS NOTED ON SMEAR, COUNT APPEARS ADEQUATE 172 230  338 320    Cardiac Enzymes:  Recent Labs Lab 04/01/14 0855 04/01/14 1420 04/01/14 1945  TROPONINI <0.30 <0.30 <0.30   BNP (last 3 results)  Recent Labs  12/09/13 0751 01/25/14 1115 03/29/14 1549  PROBNP 5630.0* 828.2* 6628.0*     CBG: No results for input(s): GLUCAP in the last 168 hours.  Recent Results (from the past 240 hour(s))  Blood culture (routine x 2)     Status: None   Collection Time: 03/29/14  3:48 PM  Result Value Ref Range Status   Specimen Description BLOOD ARM RIGHT  Final   Special Requests BOTTLES DRAWN AEROBIC AND ANAEROBIC 5CC  Final   Culture  Setup Time   Final    03/30/2014 01:30 Performed at [facility]    Culture   Final    NO GROWTH 5 DAYS Performed at [facility]    Report Status 04/05/2014 FINAL  Final  Blood culture (routine x 2)     Status: None   Collection Time: 03/29/14  5:39 PM  Result Value Ref Range Status   Specimen Description BLOOD RIGHT ARM  Final   Special Requests BOTTLES DRAWN AEROBIC AND ANAEROBIC 5CC  Final   Culture  Setup Time   Final    03/30/2014 01:30 Performed at [facility]    Culture   Final    NO GROWTH 5 DAYS Performed at [facility]    Report Status 04/05/2014 FINAL  Final  MRSA PCR Screening     Status: None   Collection Time: 03/29/14  9:25 PM  Result Value Ref Range Status   MRSA by PCR NEGATIVE NEGATIVE Final    Comment:        The GeneXpert MRSA Assay (FDA approved for NASAL specimens only), is one component of a comprehensive MRSA colonization surveillance program. It is not intended to diagnose MRSA infection nor to guide or monitor treatment for MRSA infections.   Culture, blood (routine x 2)     Status: None (Preliminary result)   Collection Time: 03/31/14  6:36 PM  Result Value Ref Range Status   Specimen Description BLOOD LEFT HAND  Final   Special Requests BOTTLES DRAWN AEROBIC AND ANAEROBIC 5CC  Final   Culture  Setup Time   Final     04/01/2014 01:38 Performed at [facility]    Culture   Final           BLOOD CULTURE RECEIVED NO GROWTH TO DATE CULTURE WILL BE HELD FOR 5 DAYS BEFORE ISSUING A FINAL NEGATIVE REPORT Performed at [facility]    Report Status PENDING  Incomplete  Culture, blood (routine x 2)     Status: None (Preliminary result)   Collection Time: 03/31/14  6:41 PM  Result Value Ref Range Status   Specimen Description BLOOD RIGHT ARM  Final   Special Requests BOTTLES DRAWN AEROBIC AND ANAEROBIC 5CC  Final  Culture  Setup Time   Final    04/01/2014 01:38 Performed at [facility]    Culture   Final           BLOOD CULTURE RECEIVED NO GROWTH TO DATE CULTURE WILL BE HELD FOR 5 DAYS BEFORE ISSUING A FINAL NEGATIVE REPORT Performed at [facility]    Report Status PENDING  Incomplete     Studies: Mr Tibia Fibula Right Wo Contrast  04/01/2014   CLINICAL DATA:  Leg swelling. Cellulitis. Abscess. Initial encounter.  EXAM: MRI OF THE RIGHT FOREFOOT WITHOUT CONTRAST; MRI OF LOWER RIGHT EXTREMITY WITHOUT CONTRAST  TECHNIQUE: Multiplanar, multisequence MR imaging was performed. No intravenous contrast was administered.  COMPARISON:  No radiographs for comparison.  FINDINGS: Abnormal bone marrow signal is present in the tibia and fibula. Bone marrow signal shows heterogenous marrow. This is a nonspecific finding most commonly associated with obesity, anemia, cigarette smoking or chronic disease.  There is diffuse subcutaneous infiltration circumferentially around the RIGHT leg, most compatible with cellulitis in the appropriate clinical setting but nonspecific. Fatty atrophy of the inferior medial head of the gastrocnemius is present. This is superimposed on mild diffuse atrophy of the leg. There is no deep soft tissue abscess. Edema of the muscular compartments may represent dependent or reactive edema. Infectious myositis is considered unlikely.  Evaluation of the foot shows similar  findings with more prominent edema anterior to the ankle and in the dorsum of the foot. Minimal ankle effusion. Subtalar joints appear within normal limits. The Achilles tendon, posterior medial and posterior lateral tendon groups appear intact. No findings to suggest osteomyelitis. Hindfoot bones appear within normal limits.  IMPRESSION: Diffuse edema of the leg and foot, which is nonspecific but can represent cellulitis, dependent edema or lymphedema. No osteomyelitis or abscess.   Electronically Signed   By: Dereck Ligas M.D.   On: 04/01/2014 12:45   Mr Foot Right Wo Contrast  04/01/2014   CLINICAL DATA:  Leg swelling. Cellulitis. Abscess. Initial encounter.  EXAM: MRI OF THE RIGHT FOREFOOT WITHOUT CONTRAST; MRI OF LOWER RIGHT EXTREMITY WITHOUT CONTRAST  TECHNIQUE: Multiplanar, multisequence MR imaging was performed. No intravenous contrast was administered.  COMPARISON:  No radiographs for comparison.  FINDINGS: Abnormal bone marrow signal is present in the tibia and fibula. Bone marrow signal shows heterogenous marrow. This is a nonspecific finding most commonly associated with obesity, anemia, cigarette smoking or chronic disease.  There is diffuse subcutaneous infiltration circumferentially around the RIGHT leg, most compatible with cellulitis in the appropriate clinical setting but nonspecific. Fatty atrophy of the inferior medial head of the gastrocnemius is present. This is superimposed on mild diffuse atrophy of the leg. There is no deep soft tissue abscess. Edema of the muscular compartments may represent dependent or reactive edema. Infectious myositis is considered unlikely.  Evaluation of the foot shows similar findings with more prominent edema anterior to the ankle and in the dorsum of the foot. Minimal ankle effusion. Subtalar joints appear within normal limits. The Achilles tendon, posterior medial and posterior lateral tendon groups appear intact. No findings to suggest osteomyelitis.  Hindfoot bones appear within normal limits.  IMPRESSION: Diffuse edema of the leg and foot, which is nonspecific but can represent cellulitis, dependent edema or lymphedema. No osteomyelitis or abscess.   Electronically Signed   By: Dereck Ligas M.D.   On: 04/01/2014 12:45   Nm Pulmonary Perf And Vent  03/31/2014   CLINICAL DATA:  Shortness of breath  EXAM: NUCLEAR MEDICINE  VENTILATION - PERFUSION LUNG SCAN  TECHNIQUE: Ventilation images were obtained in multiple projections using inhaled aerosol technetium 99 M DTPA. Perfusion images were obtained in multiple projections after intravenous injection of Tc-57m MAA.  RADIOPHARMACEUTICALS:  40 mCi Tc-57m DTPA aerosol and 6 mCi Tc-40m MAA  COMPARISON:  None.  FINDINGS: Ventilation: Only limited ventilation images were obtained. They demonstrate no definitive ventilation defect. Patient could not tolerate laying on the table.  Perfusion: Limited per fusion views were obtained and demonstrate no perfusion defect. Again full imaging could not be performed due to patient's intolerance of positioning.  IMPRESSION: Extremely limited exam although no perfusion defect to suggest pulmonary embolism is noted.   Electronically Signed   By: Inez Catalina M.D.   On: 03/31/2014 14:25   US Renal Port  04/03/2014   CLINICAL DATA:  Renal failure, personal history of coronary artery disease, hypertension, CHF, type 2 diabetes, acute leukemia  EXAM: RENAL/URINARY TRACT ULTRASOUND COMPLETE  COMPARISON:  None  FINDINGS: Right Kidney:  Length: 12.8 cm. Cortical thinning. Upper cortical echogenicity. Hypoechoic nodule at inferior pole question minimally complicated cyst versus simple cyst with artifacts. No additional mass, hydronephrosis or shadowing calcification.  Left Kidney:  Length: 11.6 cm. Cortical thinning. Increased cortical echogenicity. No mass, hydronephrosis or shadowing calcification.  Bladder:  Large fluid containing structure with loculations in pelvis, suspect  representing a large bladder with diverticula and trabeculations though cystic pelvic mass not completely excluded. Patient reportedly has a Foley catheter but this is not visualized.  Incidentally noted splenomegaly, 21.8 cm length with calculated volume 1260 mL.  IMPRESSION: Atrophic kidneys with question cyst mid inferior pole RIGHT kidney, uncertain if complicated versus simple with artifacts.  No evidence of hydronephrosis.  Large anechoic structure with multiple loculations in pelvis suspect representing a distended urinary bladder with multiple diverticula and bladder wall trabeculations though a cystic pelvic mass not entirely excluded since the patient's indwelling Foley catheter is not localized ; noncontrast CT of the pelvis recommended to assess.  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   Electronically Signed   By: Lavonia Dana M.D.   On: 04/03/2014 19:59   Dg Chest Port 1 View  03/31/2014   CLINICAL DATA:  73 year old male with shortness of breath.  EXAM: PORTABLE CHEST - 1 VIEW  COMPARISON:  03/29/2014 and prior chest radiographs dating back to 08/04/2004  FINDINGS: The cardiomediastinal silhouette is unchanged.  Pulmonary vascular congestion and probable interstitial edema again noted.  Bilateral lower lung atelectasis/airspace disease again noted.  Increased bony density is again noted.  IMPRESSION: Little interval change since prior study with probable pulmonary edema and bilateral lower lung atelectasis/ airspace disease.   Electronically Signed   By: Hassan Rowan M.D.   On: 03/31/2014 14:24   Dg Chest Portable 1 View  03/29/2014   CLINICAL DATA:  Cellulitis. Cough and congestion. Shortness of breath. Chest pain. Nausea, vomiting, fever. History of hypertension and smoking.  EXAM: PORTABLE CHEST - 1 VIEW  COMPARISON:  01/26/2014  FINDINGS: Shallow inflation. Heart is enlarged. Mild pulmonary edema. There are no focal consolidations. Although not well evaluated on this portable  view of the chest, the bones have a somewhat mottled lucent appearance raising the question of metabolic or metastatic disorder.  IMPRESSION: 1. Cardiomegaly and pulmonary edema. 2. Question of diffuse osseous changes. Recommend correlation with history and lab values. Consider further imaging if needed.   Electronically Signed   By: Shon Hale M.D.   On: 03/29/2014 18:01  Scheduled Meds: .  ceFAZolin (ANCEF) IV  1 g Intravenous 3 times per day  . feeding supplement (GLUCERNA SHAKE)  237 mL Oral BID BM  . feeding supplement (PRO-STAT SUGAR FREE 64)  30 mL Oral BID BM  . furosemide  40 mg Oral BID  . heparin  5,000 Units Subcutaneous 3 times per day  . hydrocerin   Topical Daily  . oxymetazoline  2 spray Each Nare BID  . pantoprazole  40 mg Oral Daily  . sodium chloride  3 mL Intravenous Q12H  . zolpidem  5 mg Oral QHS   Continuous Infusions: . sodium chloride 10 mL/hr at 04/05/14 1000    Active Problems:   Obstructive sleep apnea   Cellulitis   Leukocytosis   Sepsis   Morbid obesity due to excess calories   Malnutrition of moderate degree   Cellulitis of right lower leg   Venous stasis dermatitis of both lower extremities    Time spent: 40 minutes.   [facility]  [facility] Pager [phone]. If 7PM-7AM, please contact night-coverage at [email or web address], password [facility] 04/07/2014, 9:59 AM  LOS: 9 days

## 2014-04-08 DIAGNOSIS — N179 Acute kidney failure, unspecified: Secondary | ICD-10-CM

## 2014-04-08 LAB — CBC WITH DIFFERENTIAL/PLATELET
BAND NEUTROPHILS: 34 % — AB (ref 0–10)
BLASTS: 0 %
Basophils Absolute: 0 10*3/uL (ref 0.0–0.1)
Basophils Relative: 0 % (ref 0–1)
Eosinophils Absolute: 0 10*3/uL (ref 0.0–0.7)
Eosinophils Relative: 0 % (ref 0–5)
HEMATOCRIT: 25.3 % — AB (ref 39.0–52.0)
Hemoglobin: 7.8 g/dL — ABNORMAL LOW (ref 13.0–17.0)
Lymphocytes Relative: 22 % (ref 12–46)
Lymphs Abs: 5.4 10*3/uL — ABNORMAL HIGH (ref 0.7–4.0)
MCH: 25 pg — ABNORMAL LOW (ref 26.0–34.0)
MCHC: 30.8 g/dL (ref 30.0–36.0)
MCV: 81.1 fL (ref 78.0–100.0)
MONO ABS: 2 10*3/uL — AB (ref 0.1–1.0)
Metamyelocytes Relative: 5 %
Monocytes Relative: 8 % (ref 3–12)
Myelocytes: 1 %
Neutro Abs: 17.2 10*3/uL — ABNORMAL HIGH (ref 1.7–7.7)
Neutrophils Relative %: 30 % — ABNORMAL LOW (ref 43–77)
PROMYELOCYTES ABS: 0 %
Platelets: 389 10*3/uL (ref 150–400)
RBC: 3.12 MIL/uL — ABNORMAL LOW (ref 4.22–5.81)
RDW: 20.3 % — AB (ref 11.5–15.5)
WBC Morphology: INCREASED
WBC: 24.6 10*3/uL — ABNORMAL HIGH (ref 4.0–10.5)
nRBC: 0 /100 WBC

## 2014-04-08 MED ORDER — GABAPENTIN 100 MG PO CAPS
100.0000 mg | ORAL_CAPSULE | Freq: Every day | ORAL | Status: DC
  Administered 2014-04-08 – 2014-04-09 (×2): 100 mg via ORAL
  Filled 2014-04-08 (×3): qty 1

## 2014-04-08 MED ORDER — DIPHENHYDRAMINE HCL 50 MG/ML IJ SOLN
12.5000 mg | Freq: Once | INTRAMUSCULAR | Status: AC
  Administered 2014-04-09: 12.5 mg via INTRAVENOUS
  Filled 2014-04-08: qty 1

## 2014-04-08 NOTE — Progress Notes (Signed)
[facility] PROGRESS NOTE  Zakhai Meisinger TKP:[id number] DOB: 20-Aug-1940 DOA: 03/29/2014 PCP: [name], MD  Assessment/Plan: 73 y.o. Male with PMH of HTN, CAD,m CHF, CKD, is admitted for recurrent cellulitis of bilateral lower extremities right greater than left and difficulty with ambulation. In the ER the patient was found to be fairly lethargic with desaturation into the 80s, found to have elevated lactic acid and white blood cell count. -He also has a history of anemia and thrombocytosis. His anemia is stable. He had been on Anagrelide prior to admission for his myeloproliferative disorder. He has been experiencing malaise, fatigue, over the last 2 months, short of breath on exertion, not at rest.  Oncology was consulted regarding his elevated white count.  Around August 2014, it became chronically elevated ranging from 12.4 to as high as 20.5.  He was referred to [facility], as acute leukemia was suspected. On 11/30/13 Dr. [name] consulted patient. 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 He had 3% blasts in the peripheral blood, which was confirmed by flow cytometry.  Patient refused bone marrow biopsy  Per chart notes, we was considered a good candidate for Jakafi, but patient declined treatment at the time stating that he would follow in [place], but failed to follow up at the [facility].  Infectious disease was consulted for antibiotic management given no improvement on vancomycin and Zosyn  1. Cellulitis/sepsis/persistent fever; completed cefazolin per infectious disease recommendations MRI Shows diffuse edema of the leg and the foot, no abscess -switched to cefazolin 11/16 -11/21renally dosed. Patient not known to have mrsa colonization. discontinued vanco and cefepime.Stop date 11/21; probable underlying chronic skin dermatitis   2. 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  -He refused a bone marrow biopsy. The patient is non-compliant and has very poor understanding of disease process. [name] was recommended as a therapy. However, He failed to follow up at either [facility].  -The patient is now too weak for systemic treatment and oncology does not recommend repeat BM biopsy. -Oncology recommends palliative care consultation, -Dr. Allyson Sabal Discussed with Dr [name]. She recommended that she has nothing to offer and that patient would need to see his oncologist in [place] after discharge. 3. Sinus tachycardia Was likely secondary to sepsis, blast crisis, persistent -No improvement despite treatment of infection as well as hydration -VQ scan low probability;bilateral lower extremity Doppler to rule out DVT,negative 4. Acute on chronic renal failure thought due ot ATN/sepsis Creatinine has stabilized after the patient developed acute renal failure and creatinine had increased to 1.32- 2.44 -Renal ultrasound does not show any hydronephrosis, it shows atrophy kidney, splenomegaly -Uric acid level within normal limits, patient restarted on Lasix by nephrology -[place] nephrology input, Dr. Posey Pronto, patient remains nonoliguric, renal function seems to have pleateued Nephrology has signed off 5. Chronic diastolic heart failure  -Echocardiogram in January 2015 showed a grade 1 diastolic dysfunction with an EF of 55-60%  - Clinically compensated ; holding lisinopril 6. Anemia Secondary to chronic disease , hemoglobin trending down; continue to monitor CBC; TF prn  7. Thrombocytosis According to the patient, he had extensive investigations at [place] and was placed on anagrelide. Anagrelide discontinued  on 11/15 as his platelet counts were  179 as of 11/15 8. History of CAD  - Reviewed outpatient cardiology notes-had a normal Myoview in 2007.  - Stable without any chest pain or shortness of breath; Cardiac enzymes negative 9. Epistaxis Likely secondary to dysfunctional platelets, although platelet count is normal Started the patient on afrin, ocean nasal spray 10. Peripheral neuropathy, will try gabapentin    D/c plans SNF; will ask SW; ongoing palliative care discussions; f/u on Monday    Code Status: full Family Communication: d/w patient, no family at the bedside (indicate person spoken with, relationship, and if by phone, the number) Disposition Plan: snf    Consultants:  Oncology, palliative care, psychiatry    Procedures:  none  Antibiotics:  Vancomycin/Zosyn 11/16  Cefazolin 11/16 HPI/Subjective: alert  Objective: Filed Vitals:   04/08/14 1233  BP: 134/73  Pulse: 97  Temp: 98.4 F (36.9 C)  Resp: 18    Intake/Output Summary (Last 24 hours) at 04/08/14 1533 Last data filed at 04/08/14 1233  Gross per 24 hour  Intake    243 ml  Output    750 ml  Net   -507 ml   Filed Weights   03/29/14 2130  Weight: 118.5 kg (261 lb 3.9 oz)    Exam:   General:  alert  Cardiovascular: s1,s2 rrr  Respiratory: CTA BL  Abdomen: soft, nt,nd   Musculoskeletal: BL LE edema, blisters, erythema    Data Reviewed: Basic Metabolic Panel:  Recent Labs Lab 04/03/14 0257 04/04/14 0317 04/05/14 0305 04/06/14 0249 04/07/14 0545  NA 137 133* 141 140 140  K 4.6 4.8 4.6 4.7 4.9  CL 102 100 105 105 105  CO2 $Re'21 20 23 23 23  'eWD$ GLUCOSE 97 98 111* 116* 104*  BUN 39* 43* 41* 37* 35*  CREATININE 2.40* 2.44* 2.27* 2.13* 1.98*  CALCIUM 9.0 9.1 8.9 9.0 8.8   Liver Function Tests:  Recent Labs Lab 04/02/14 0226  AST 18  ALT 12  ALKPHOS 136*  BILITOT 0.5  PROT 6.0  ALBUMIN 1.9*   No results for input(s): LIPASE, AMYLASE in the last 168 hours. No results for  input(s): AMMONIA in the last 168 hours. CBC:  Recent Labs Lab 04/04/14 0317 04/05/14 0305 04/06/14 0249 04/07/14 0545 04/08/14 0431  WBC 34.0* 35.0* 31.7* 27.1* 24.6*  NEUTROABS 28.9* 29.8* 26.9* 19.5* 17.2*  HGB 8.1* 8.3* 8.4* 8.0* 7.8*  HCT 25.5* 26.6* 26.6* 25.1* 25.3*  MCV 80.2 80.1 80.6 81.0 81.1  PLT 172 230 338 320 389   Cardiac Enzymes:  Recent Labs Lab 04/01/14 1945  TROPONINI <0.30   BNP (last 3 results)  Recent Labs  12/09/13 0751 01/25/14 1115 03/29/14 1549  PROBNP 5630.0* 828.2* 6628.0*   CBG: No results for input(s): GLUCAP in the last 168 hours.  Recent Results (from the past 240 hour(s))  Blood culture (routine x 2)     Status: None   Collection Time: 03/29/14  3:48 PM  Result Value Ref Range Status   Specimen Description BLOOD ARM RIGHT  Final   Special Requests BOTTLES DRAWN AEROBIC AND ANAEROBIC 5CC  Final   Culture  Setup Time   Final    03/30/2014 01:30 Performed at [facility]    Culture   Final    NO GROWTH 5 DAYS Performed at [facility]    Report Status 04/05/2014 FINAL  Final  Blood culture (routine x 2)     Status: None   Collection Time: 03/29/14  5:39 PM  Result Value Ref  Range Status   Specimen Description BLOOD RIGHT ARM  Final   Special Requests BOTTLES DRAWN AEROBIC AND ANAEROBIC 5CC  Final   Culture  Setup Time   Final    03/30/2014 01:30 Performed at [facility]    Culture   Final    NO GROWTH 5 DAYS Performed at [facility]    Report Status 04/05/2014 FINAL  Final  MRSA PCR Screening     Status: None   Collection Time: 03/29/14  9:25 PM  Result Value Ref Range Status   MRSA by PCR NEGATIVE NEGATIVE Final    Comment:        The GeneXpert MRSA Assay (FDA approved for NASAL specimens only), is one component of a comprehensive MRSA colonization surveillance program. It is not intended to diagnose MRSA infection nor to guide or monitor treatment for MRSA infections.    Culture, blood (routine x 2)     Status: None   Collection Time: 03/31/14  6:36 PM  Result Value Ref Range Status   Specimen Description BLOOD LEFT HAND  Final   Special Requests BOTTLES DRAWN AEROBIC AND ANAEROBIC 5CC  Final   Culture  Setup Time   Final    04/01/2014 01:38 Performed at [place]   Final    NO GROWTH 5 DAYS Performed at [facility]    Report Status 04/07/2014 FINAL  Final  Culture, blood (routine x 2)     Status: None   Collection Time: 03/31/14  6:41 PM  Result Value Ref Range Status   Specimen Description BLOOD RIGHT ARM  Final   Special Requests BOTTLES DRAWN AEROBIC AND ANAEROBIC 5CC  Final   Culture  Setup Time   Final    04/01/2014 01:38 Performed at [place]   Final    NO GROWTH 5 DAYS Performed at [facility]    Report Status 04/07/2014 FINAL  Final     Studies: No results found.  Scheduled Meds: .  ceFAZolin (ANCEF) IV  1 g Intravenous 3 times per day  . feeding supplement (GLUCERNA SHAKE)  237 mL Oral BID BM  . feeding supplement (PRO-STAT SUGAR FREE 64)  30 mL Oral BID BM  . furosemide  40 mg Oral BID  . heparin  5,000 Units Subcutaneous 3 times per day  . hydrocerin   Topical Daily  . oxymetazoline  2 spray Each Nare BID  . pantoprazole  40 mg Oral Daily  . sodium chloride  3 mL Intravenous Q12H  . zolpidem  5 mg Oral QHS   Continuous Infusions: . sodium chloride 10 mL/hr at 04/05/14 1000    Principal Problem:   Cellulitis Active Problems:   Obstructive sleep apnea   Leukocytosis   Sepsis   Morbid obesity due to excess calories   Malnutrition of moderate degree   Cellulitis of right lower leg   Venous stasis dermatitis of both lower extremities    Time spent: >35 minutes     Kinnie Feil  [facility] Pager [phone]. If 7PM-7AM, please contact night-coverage at [email or web address], password [facility] 04/08/2014, 3:33 PM  LOS: 10 days

## 2014-04-08 NOTE — Progress Notes (Signed)
OT Cancellation Note  Patient Details Name: Juan [name] MRN: [id number] DOB: 10-28-1940   Cancelled Treatment:    Reason Eval/Treat Not Completed: OT screened. Pt is Medicare and current D/C plan is SNF. No apparent immediate acute care OT needs, therefore will defer OT to SNF. If OT eval is needed please call Acute Rehab Dept. at [phone] or text page OT at [phone].   Benito Mccreedy OTR/L [phone] 04/08/2014, 8:47 AM

## 2014-04-09 DIAGNOSIS — A419 Sepsis, unspecified organism: Secondary | ICD-10-CM

## 2014-04-09 DIAGNOSIS — D72829 Elevated white blood cell count, unspecified: Secondary | ICD-10-CM

## 2014-04-09 DIAGNOSIS — L03115 Cellulitis of right lower limb: Secondary | ICD-10-CM

## 2014-04-09 LAB — CBC WITH DIFFERENTIAL/PLATELET
BASOS ABS: 0 10*3/uL (ref 0.0–0.1)
BASOS PCT: 0 % (ref 0–1)
BLASTS: 2 %
Band Neutrophils: 16 % — ABNORMAL HIGH (ref 0–10)
Eosinophils Absolute: 0 10*3/uL (ref 0.0–0.7)
Eosinophils Relative: 0 % (ref 0–5)
HCT: 26.9 % — ABNORMAL LOW (ref 39.0–52.0)
HEMOGLOBIN: 8 g/dL — AB (ref 13.0–17.0)
LYMPHS ABS: 6.3 10*3/uL — AB (ref 0.7–4.0)
LYMPHS PCT: 26 % (ref 12–46)
MCH: 24.2 pg — ABNORMAL LOW (ref 26.0–34.0)
MCHC: 29.7 g/dL — ABNORMAL LOW (ref 30.0–36.0)
MCV: 81.3 fL (ref 78.0–100.0)
Metamyelocytes Relative: 18 %
Monocytes Absolute: 0.7 10*3/uL (ref 0.1–1.0)
Monocytes Relative: 3 % (ref 3–12)
Myelocytes: 5 %
NEUTROS ABS: 16.7 10*3/uL — AB (ref 1.7–7.7)
NEUTROS PCT: 30 % — AB (ref 43–77)
NRBC: 0 /100{WBCs}
PROMYELOCYTES ABS: 0 %
Platelets: 390 10*3/uL (ref 150–400)
RBC: 3.31 MIL/uL — AB (ref 4.22–5.81)
RDW: 19.7 % — ABNORMAL HIGH (ref 11.5–15.5)
WBC: 24.2 10*3/uL — ABNORMAL HIGH (ref 4.0–10.5)

## 2014-04-09 LAB — GLUCOSE, CAPILLARY: Glucose-Capillary: 110 mg/dL — ABNORMAL HIGH (ref 70–99)

## 2014-04-09 NOTE — Plan of Care (Signed)
Problem: Phase I Progression Outcomes Goal: Temperature < 102 Outcome: Completed/Met Date Met:  04/09/14

## 2014-04-09 NOTE — Progress Notes (Signed)
Physical Therapy Treatment Patient Details Name: Juan [name] MRN: [id number] DOB: March 14, 1941 Today's Date: 04/09/2014    History of Present Illness 73 y.o. Male admitted for cellulitis of bilateral lower extremities right greater than left. Difficulty with ambulation. In the ER the patient was found to be fairly lethargic with desaturation into the 80s, found to have elevated lactic acid and white blood cell count. Patient has had a long-standing history of recurrent leukocytosis, not on current treatment at this time due to loss of follow-up with oncology,  Pt with bi LE cellulitis. Multiple co-morbidities. pallitative and psych consult pending.     PT Comments    Pt is progressing well with his bed mobility, requiring less assist overall, however, he reports being too nervous/anxious to attempt standing even with two person assist and RW.  Despite education and encouragement, we had to position pt back in supine in the bed due to refusal to even attempt standing at EOB.  PT will continue to follow acutely to attempt standing and gait as pt is willing/able.    Follow Up Recommendations  SNF     Equipment Recommendations  Rolling walker with 5" wheels    Recommendations for Other Services   NA     Precautions / Restrictions Precautions Precautions: Fall Restrictions Weight Bearing Restrictions: No    Mobility  Bed Mobility Overal bed mobility: Needs Assistance;+2 for physical assistance Bed Mobility: Supine to Sit;Sit to Supine Rolling: +2 for physical assistance;Min assist   Supine to sit: +2 for physical assistance;Min assist Sit to supine: +2 for physical assistance;Mod assist   General bed mobility comments: Two person min assist to roll, pt assisting with bil upper extremity and can move left leg a bit unassisted.  Most help needed at hips and with left leg mobility and comfort during transitions.  Pt was able to progress bil lower extremities assisted to EOB (significant  extra time needed due to anticipation of pain) and pt able to help with [facility] elevated (~30 degress and bil upper extremities on railing) pull his  trunk to upright EOB.  Two person assist needed for sit to supine one to get both legs and one to help control descent of trunk to bed.    Transfers                 General transfer comment: Pt refusing to even attempt stand, stating he needs a pill for his "nerves" and wants to wait until the RN can bring a pill for his anxiety.  Pt refusing even to attempt standing EOB with two person assist and RW even after assured that it would be safe.           Balance Overall balance assessment: Needs assistance Sitting-balance support: Feet supported;No upper extremity supported Sitting balance-Leahy Scale: Good Sitting balance - Comments: Pt able to sit EOB today once feet supported with supervision.                             Cognition Arousal/Alertness: Awake/alert Behavior During Therapy: Anxious Overall Cognitive Status: No family/caregiver present to determine baseline cognitive functioning                             Pertinent Vitals/Pain Pain Assessment: Faces Faces Pain Scale: Hurts even more Pain Location: with mobility of his right leg.  Pain Descriptors / Indicators: Burning Pain Intervention(s): Limited activity within  patient's tolerance;Monitored during session;Repositioned           PT Goals (current goals can now be found in the care plan section) Acute Rehab PT Goals Patient Stated Goal: To wait until he is not nervous to get up. "I need a nerve pill" Progress towards PT goals: Progressing toward goals    Frequency  Min 2X/week    PT Plan Frequency needs to be updated       End of Session Equipment Utilized During Treatment: Gait belt Activity Tolerance: Other (comment) (limited mostly by anxiety/fear) Patient left: in bed;with call Recker/phone within reach     Time: 0839-0920 PT  Time Calculation (min) (ACUTE ONLY): 41 min  Charges:  $Therapeutic Activity: 38-52 mins                      Goldy Calandra B. Vi Biddinger, PT, DPT [phone]   04/09/2014, 12:28 PM

## 2014-04-09 NOTE — Clinical Social Work Note (Addendum)
CSW received consult.  Psychiatry and Palliative Care have both been consulted.  CSW will continue to follow and assist with disposition.   Nonnie Done, [place] [phone]  Psychiatric & Orthopedics (5N 1-16) Clinical Social Worker

## 2014-04-09 NOTE — Progress Notes (Signed)
[facility] PROGRESS NOTE  Juan [name] QMG:[id number] DOB: 10/20/1940 DOA: 03/29/2014 PCP: [name], MD   Interim summary : 73 y.o. Male with PMH of HTN, CAD,m CHF, CKD, is admitted for recurrent cellulitis of bilateral lower extremities right greater than left and difficulty with ambulation. In the ER the patient was found to be fairly lethargic with desaturation into the 80s, found to have elevated lactic acid and white blood cell count. -He also has a history of anemia and thrombocytosis. His anemia is stable. He had been on Anagrelide prior to admission for his myeloproliferative disorder.  Assessment/plan: 1. Cellulitis/sepsis/persistent fever; completed cefazolin per infectious disease recommendations MRI Shows diffuse edema of the leg and the foot, no abscess -switched to cefazolin 11/16 -11/22 renally dosed. Patient not known to have mrsa colonization.  -discontinued vanco and cefepime on 11/21 -chronic changes suggesting ; probable underlying chronic skin dermatitis    2. 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 Rudean [name] at [facility]. 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  -patient refused a bone marrow biopsy. The patient is non-compliant with appointments and has very poor understanding of disease process. Juan [name] was recommended as a therapy. However, He failed to follow up at either [place].  -The patient is now too weak for systemic treatment and oncology does not recommend BM biopsy. -Oncology recommends palliative care consultation and supportive care; with second opinion for treatment if any by primary oncology at [facility] -Dr Juan [name] recommended that she has nothing to offer and that patient would need to see his oncologist in [facility] after discharge.  3. Sinus tachycardia most  likely secondary to fever and blast crisis  -VQ scan low probability for PE;bilateral lower extremity Doppler negative for DVT -HR lower and improved.  4. Acute on chronic renal failure thought due ot ATN/sepsis -Creatinine at baseline around 1.4 -Renal ultrasound does not show any hydronephrosis, it shows atrophic kidney and no obstruction -Uric acid level within normal limits -continue lasix BID; Cr is trending down (1.98 today 11/23)  5. Chronic diastolic heart failure  -Echocardiogram in January 2015 showed a grade 1 diastolic dysfunction with an EF of 55-60%  - Clinically compensated  -will continue lasix, low sodium diet, daily weight and low sodium diet -patient's LE swelling is chronic and most likely related with obesity   6. Anemia Secondary to chronic disease , hemoglobin trending down; continue to monitor CBC; will transfuse as needed for Hgb < 7.0  7. Thrombocytosis According to the patient, he had extensive investigations at [place] and was placed on anagrelide. Anagrelide discontinued on 11/15 as his platelet counts were 179 as of 11/15  8. History of CAD  - Reviewed outpatient cardiology notes-had a normal Myoview in 2007.  - Stable overall, no chest pain or shortness of breath; Cardiac enzymes negative X2  9. Epistaxis Likely secondary to dysfunctional platelets, although platelet count is normal Started the patient on afrin, ocean nasal spray and ask patient not to pick on his nose.  10. Peripheral neuropathy, will continue gabapentin   Code Status: full Family Communication: d/w patient, no family at the bedside Disposition Plan: snf    Consultants:  Oncology, palliative care, psychiatry    Procedures:  none  Antibiotics:  Vancomycin/Zosyn 11/16  Cefazolin 11/16>>11/22   HPI/Subjective: Afebrile, NAD, no CP, no SOB.  Objective: Filed Vitals:  04/09/14 1245  BP: 139/79  Pulse: 106  Temp: 98.5 F (36.9 C)  Resp: 18     Intake/Output Summary (Last 24 hours) at 04/09/14 1516 Last data filed at 04/09/14 1249  Gross per 24 hour  Intake    520 ml  Output    800 ml  Net   -280 ml   Filed Weights   03/29/14 2130  Weight: 118.5 kg (261 lb 3.9 oz)    Exam:   General:  Alert, awake and oriented X3; no acute distress; afebrile  Cardiovascular: s1,s2 rrr  Respiratory: CTA BL  Abdomen: soft, nt,nd   Musculoskeletal: BL LE edema and chronic changes from stasis dermatitis, RLE with blisters; no signs of superimposed infection currently. Patient reports pain with movement     Data Reviewed: Basic Metabolic Panel:  Recent Labs Lab 04/03/14 0257 04/04/14 0317 04/05/14 0305 04/06/14 0249 04/07/14 0545  NA 137 133* 141 140 140  K 4.6 4.8 4.6 4.7 4.9  CL 102 100 105 105 105  CO2 _0 GLUCOSE 97 98 111* 116* 104*  BUN 39* 43* 41* 37* 35*  CREATININE 2.40* 2.44* 2.27* 2.13* 1.98*  CALCIUM 9.0 9.1 8.9 9.0 8.8   CBC:  Recent Labs Lab 04/05/14 0305 04/06/14 0249 04/07/14 0545 04/08/14 0431 04/09/14 0640  WBC 35.0* 31.7* 27.1* 24.6* 24.2*  NEUTROABS 29.8* 26.9* 19.5* 17.2* 16.7*  HGB 8.3* 8.4* 8.0* 7.8* 8.0*  HCT 26.6* 26.6* 25.1* 25.3* 26.9*  MCV 80.1 80.6 81.0 81.1 81.3  PLT 230 338 320 389 390   BNP (last 3 results)  Recent Labs  12/09/13 0751 01/25/14 1115 03/29/14 1549  PROBNP 5630.0* 828.2* 6628.0*   CBG: No results for input(s): GLUCAP in the last 168 hours.  Recent Results (from the past 240 hour(s))  Culture, blood (routine x 2)     Status: None   Collection Time: 03/31/14  6:36 PM  Result Value Ref Range Status   Specimen Description BLOOD LEFT HAND  Final   Special Requests BOTTLES DRAWN AEROBIC AND ANAEROBIC 5CC  Final   Culture  Setup Time   Final    04/01/2014 01:38 Performed at [place]   Final    NO GROWTH 5 DAYS Performed at [facility]    Report Status 04/07/2014 FINAL  Final  Culture, blood (routine x 2)      Status: None   Collection Time: 03/31/14  6:41 PM  Result Value Ref Range Status   Specimen Description BLOOD RIGHT ARM  Final   Special Requests BOTTLES DRAWN AEROBIC AND ANAEROBIC 5CC  Final   Culture  Setup Time   Final    04/01/2014 01:38 Performed at [place]   Final    NO GROWTH 5 DAYS Performed at [facility]    Report Status 04/07/2014 FINAL  Final     Studies: No results found.  Scheduled Meds: . feeding supplement (GLUCERNA SHAKE)  237 mL Oral BID BM  . feeding supplement (PRO-STAT SUGAR FREE 64)  30 mL Oral BID BM  . furosemide  40 mg Oral BID  . gabapentin  100 mg Oral QHS  . heparin  5,000 Units Subcutaneous 3 times per day  . hydrocerin   Topical Daily  . oxymetazoline  2 spray Each Nare BID  . pantoprazole  40 mg Oral Daily  . sodium chloride  3 mL Intravenous Q12H  . zolpidem  5 mg Oral QHS   Continuous Infusions: . sodium chloride 10 mL/hr at 04/05/14 1000    Principal Problem:   Cellulitis Active Problems:   Obstructive sleep apnea   Leukocytosis   Sepsis   Morbid obesity due to excess calories   Malnutrition of moderate degree   Cellulitis of right lower leg   Venous stasis dermatitis of both lower extremities    Time spent: >35 minutes     Barton Dubois  [facility] Pager [phone]. If 7PM-7AM, please contact night-coverage at [email or web address], password [facility] 04/09/2014, 3:16 PM  LOS: 11 days

## 2014-04-09 NOTE — Progress Notes (Signed)
Progress Note from the Palliative Medicine Team at [place] [facility]: I spoke with Mr. Manard today and reminded him of Dr. [name] note as I saw in [place] from [facility] that he had initial bloodwork and then it was recommended for further bloodwork and bone marrow biopsy but he refused and said that he had a letter from "a local hospital in [place]" requesting him to come back and have a second opinion. Mr. Dumond tells me that "a doctor" told him that his blood counts where fine and he had no reason to worry about them. He had no recollection of speaking with Dr. [name] this admission. I feel like I am having the same conversation with Mr. Garner each day with no progress. He is always deferring to "a doctor" that said he has nothing to worry about and that he needs to discuss this information from his primary doctor - he is unable to tell me a name of this "doctor." He does say he is interested in further testing/investigation/treatment options but then says he cannot get to [facility] for this. I will ask [facility] to help with transportation options. I asked Mr. Molner about code status and if this is anything he had thought about before - he said no and refers me to his daughter [name] to discuss with him.   I discussed with [name] about meeting and talking through these things such as further testing/intervention from oncology and code status. She is quite frustrated herself with trying to help her father as he is very evasive with them. She tells me that he often defers and deflects when she tries to help him as well. [name] feels that at this point she is unable to meet together with her father as she says this will only cause more frustration he will only argue with her. She tells me that there is a long history of distrust and dysfunction with her father and the rest of the family. She is open to be updated but at this point feels the plan for SNF rehab and work on transportation to  [facility] for follow up is a good plan. Unfortunately, there is no way to know if he will cooperate when/if he follows up at [facility] but for now he is saying he wants further work up and options.     Objective: No Known Allergies Scheduled Meds: . feeding supplement (GLUCERNA SHAKE)  237 mL Oral BID BM  . feeding supplement (PRO-STAT SUGAR FREE 64)  30 mL Oral BID BM  . furosemide  40 mg Oral BID  . gabapentin  100 mg Oral QHS  . heparin  5,000 Units Subcutaneous 3 times per day  . hydrocerin   Topical Daily  . oxymetazoline  2 spray Each Nare BID  . pantoprazole  40 mg Oral Daily  . sodium chloride  3 mL Intravenous Q12H  . zolpidem  5 mg Oral QHS   Continuous Infusions: . sodium chloride 10 mL/hr at 04/05/14 1000   PRN Meds:.acetaminophen **OR** acetaminophen, alum & mag hydroxide-simeth, gi cocktail, metoprolol, morphine injection, ondansetron, sodium chloride  BP 139/79 mmHg  Pulse 106  Temp(Src) 98.5 F (36.9 C) (Oral)  Resp 18  Ht _0  (1.88 m)  Wt 118.5 kg (261 lb 3.9 oz)  BMI 33.53 kg/m2  SpO2 100%   PPS: 30%   Intake/Output Summary (Last 24 hours) at 04/09/14 1405 Last data filed at 04/09/14 1249  Gross per 24 hour  Intake    760  ml  Output   1050 ml  Net   -290 ml      LBM: 11/22  Physical Exam:  General: NAD, ill-appearing, frail HEENT: [place]/AT, no JVD, moist mucous membranes Chest: No labored breathing, symmetric CVS: RRR, S1 S2 Abdomen: Soft, NT, ND, +BS Ext: BLE edema, erythema improved, tenderness improved  Neuro: Awake, alert, oriented x 3   Labs: CBC    Component Value Date/Time   WBC 24.2* 04/09/2014 0640   WBC 18.4* 11/16/2013 0847   RBC 3.31* 04/09/2014 0640   RBC 3.82* 11/16/2013 0847   HGB 8.0* 04/09/2014 0640   HGB 9.8* 11/16/2013 0847   HCT 26.9* 04/09/2014 0640   HCT 30.7* 11/16/2013 0847   PLT 390 04/09/2014 0640   PLT 323 11/16/2013 0847   MCV 81.3 04/09/2014 0640   MCV 80.3 11/16/2013 0847   MCH 24.2* 04/09/2014 0640    MCH 25.5* 11/16/2013 0847   MCHC 29.7* 04/09/2014 0640   MCHC 31.8* 11/16/2013 0847   RDW 19.7* 04/09/2014 0640   RDW 20.0* 11/16/2013 0847   LYMPHSABS 6.3* 04/09/2014 0640   MONOABS 0.7 04/09/2014 0640   EOSABS 0.0 04/09/2014 0640   BASOSABS 0.0 04/09/2014 0640    BMET    Component Value Date/Time   NA 140 04/07/2014 0545   NA 138 11/16/2013 0847   K 4.9 04/07/2014 0545   K 4.5 11/16/2013 0847   CL 105 04/07/2014 0545   CO2 23 04/07/2014 0545   CO2 22 11/16/2013 0847   GLUCOSE 104* 04/07/2014 0545   GLUCOSE 126 11/16/2013 0847   BUN 35* 04/07/2014 0545   BUN 20.2 11/16/2013 0847   CREATININE 1.98* 04/07/2014 0545   CREATININE 1.2 11/16/2013 0847   CALCIUM 8.8 04/07/2014 0545   CALCIUM 9.8 11/16/2013 0847   GFRNONAA 32* 04/07/2014 0545   GFRAA 37* 04/07/2014 0545    CMP     Component Value Date/Time   NA 140 04/07/2014 0545   NA 138 11/16/2013 0847   K 4.9 04/07/2014 0545   K 4.5 11/16/2013 0847   CL 105 04/07/2014 0545   CO2 23 04/07/2014 0545   CO2 22 11/16/2013 0847   GLUCOSE 104* 04/07/2014 0545   GLUCOSE 126 11/16/2013 0847   BUN 35* 04/07/2014 0545   BUN 20.2 11/16/2013 0847   CREATININE 1.98* 04/07/2014 0545   CREATININE 1.2 11/16/2013 0847   CALCIUM 8.8 04/07/2014 0545   CALCIUM 9.8 11/16/2013 0847   PROT 6.0 04/02/2014 0226   PROT 6.7 11/16/2013 0847   ALBUMIN 1.9* 04/02/2014 0226   ALBUMIN 3.4* 11/16/2013 0847   AST 18 04/02/2014 0226   AST 25 11/16/2013 0847   ALT 12 04/02/2014 0226   ALT 11 11/16/2013 0847   ALKPHOS 136* 04/02/2014 0226   ALKPHOS 125 11/16/2013 0847   BILITOT 0.5 04/02/2014 0226   BILITOT 0.35 11/16/2013 0847   GFRNONAA 32* 04/07/2014 0545   GFRAA 37* 04/07/2014 0545    Assessment and Plan: 1. Code Status: FULL 2. Symptom Control: 1. Weakness: Continue medical management and antibiotics with PT and plan for rehab.  2. Malnutrition: Continue encouraging small frequent meals and supplements.  3. Pain in legs: Improved.   3. Psycho/Social: Emotional support provided to patient and to family via telephone.  4. Disposition: SNF with palliative to follow.     Time In Time Out Total Time Spent with Patient Total Overall Time  1300 1350 64mn 533m    Greater than 50%  of this time was spent  counseling and coordinating care related to the above assessment and plan.  Vinie Sill, NP Palliative Medicine Team Pager # [phone] (M-F 8a-5p) Team Phone # [phone] (Nights/Weekends)

## 2014-04-09 NOTE — Plan of Care (Signed)
Problem: Phase III Progression Outcomes Goal: Temperature < 100 Outcome: Completed/Met Date Met:  04/09/14

## 2014-04-09 NOTE — Plan of Care (Signed)
Problem: Phase I Progression Outcomes Goal: Pain controlled with appropriate interventions Outcome: Completed/Met Date Met:  04/09/14     

## 2014-04-09 NOTE — Plan of Care (Signed)
Problem: Phase I Progression Outcomes Goal: Wound assessment- dressing change as appropriate Outcome: Completed/Met Date Met:  04/09/14

## 2014-04-10 DIAGNOSIS — I5032 Chronic diastolic (congestive) heart failure: Secondary | ICD-10-CM | POA: Diagnosis not present

## 2014-04-10 DIAGNOSIS — E440 Moderate protein-calorie malnutrition: Secondary | ICD-10-CM | POA: Diagnosis not present

## 2014-04-10 DIAGNOSIS — E119 Type 2 diabetes mellitus without complications: Secondary | ICD-10-CM | POA: Diagnosis not present

## 2014-04-10 DIAGNOSIS — I509 Heart failure, unspecified: Secondary | ICD-10-CM | POA: Diagnosis not present

## 2014-04-10 DIAGNOSIS — M79606 Pain in leg, unspecified: Secondary | ICD-10-CM | POA: Diagnosis not present

## 2014-04-10 DIAGNOSIS — Z9981 Dependence on supplemental oxygen: Secondary | ICD-10-CM | POA: Diagnosis not present

## 2014-04-10 DIAGNOSIS — K649 Unspecified hemorrhoids: Secondary | ICD-10-CM | POA: Diagnosis not present

## 2014-04-10 DIAGNOSIS — G4733 Obstructive sleep apnea (adult) (pediatric): Secondary | ICD-10-CM

## 2014-04-10 DIAGNOSIS — C9500 Acute leukemia of unspecified cell type not having achieved remission: Secondary | ICD-10-CM | POA: Diagnosis not present

## 2014-04-10 DIAGNOSIS — L03115 Cellulitis of right lower limb: Secondary | ICD-10-CM | POA: Diagnosis not present

## 2014-04-10 DIAGNOSIS — R079 Chest pain, unspecified: Secondary | ICD-10-CM | POA: Diagnosis not present

## 2014-04-10 DIAGNOSIS — I2510 Atherosclerotic heart disease of native coronary artery without angina pectoris: Secondary | ICD-10-CM | POA: Diagnosis not present

## 2014-04-10 DIAGNOSIS — L89312 Pressure ulcer of right buttock, stage 2: Secondary | ICD-10-CM | POA: Diagnosis not present

## 2014-04-10 DIAGNOSIS — R1011 Right upper quadrant pain: Secondary | ICD-10-CM | POA: Diagnosis not present

## 2014-04-10 DIAGNOSIS — R000 Tachycardia, unspecified: Secondary | ICD-10-CM | POA: Diagnosis not present

## 2014-04-10 DIAGNOSIS — Z79899 Other long term (current) drug therapy: Secondary | ICD-10-CM | POA: Diagnosis not present

## 2014-04-10 DIAGNOSIS — D649 Anemia, unspecified: Secondary | ICD-10-CM | POA: Diagnosis not present

## 2014-04-10 DIAGNOSIS — M791 Myalgia: Secondary | ICD-10-CM | POA: Diagnosis not present

## 2014-04-10 DIAGNOSIS — M6281 Muscle weakness (generalized): Secondary | ICD-10-CM | POA: Diagnosis not present

## 2014-04-10 DIAGNOSIS — L0390 Cellulitis, unspecified: Secondary | ICD-10-CM | POA: Diagnosis not present

## 2014-04-10 DIAGNOSIS — Z872 Personal history of diseases of the skin and subcutaneous tissue: Secondary | ICD-10-CM | POA: Diagnosis not present

## 2014-04-10 DIAGNOSIS — M546 Pain in thoracic spine: Secondary | ICD-10-CM | POA: Diagnosis not present

## 2014-04-10 DIAGNOSIS — N189 Chronic kidney disease, unspecified: Secondary | ICD-10-CM | POA: Diagnosis not present

## 2014-04-10 DIAGNOSIS — N644 Mastodynia: Secondary | ICD-10-CM | POA: Diagnosis present

## 2014-04-10 DIAGNOSIS — G43909 Migraine, unspecified, not intractable, without status migrainosus: Secondary | ICD-10-CM | POA: Diagnosis not present

## 2014-04-10 DIAGNOSIS — I10 Essential (primary) hypertension: Secondary | ICD-10-CM | POA: Diagnosis not present

## 2014-04-10 DIAGNOSIS — Z7951 Long term (current) use of inhaled steroids: Secondary | ICD-10-CM | POA: Diagnosis not present

## 2014-04-10 DIAGNOSIS — D72829 Elevated white blood cell count, unspecified: Secondary | ICD-10-CM | POA: Diagnosis not present

## 2014-04-10 DIAGNOSIS — D638 Anemia in other chronic diseases classified elsewhere: Secondary | ICD-10-CM | POA: Diagnosis not present

## 2014-04-10 DIAGNOSIS — Z87891 Personal history of nicotine dependence: Secondary | ICD-10-CM | POA: Diagnosis not present

## 2014-04-10 DIAGNOSIS — I872 Venous insufficiency (chronic) (peripheral): Secondary | ICD-10-CM | POA: Diagnosis not present

## 2014-04-10 LAB — CBC WITH DIFFERENTIAL/PLATELET
BAND NEUTROPHILS: 7 % (ref 0–10)
BASOS PCT: 1 % (ref 0–1)
Basophils Absolute: 0.2 10*3/uL — ABNORMAL HIGH (ref 0.0–0.1)
Blasts: 4 %
EOS ABS: 0.6 10*3/uL (ref 0.0–0.7)
EOS PCT: 3 % (ref 0–5)
HEMATOCRIT: 24.9 % — AB (ref 39.0–52.0)
Hemoglobin: 7.6 g/dL — ABNORMAL LOW (ref 13.0–17.0)
LYMPHS ABS: 4.8 10*3/uL — AB (ref 0.7–4.0)
Lymphocytes Relative: 23 % (ref 12–46)
MCH: 24.5 pg — ABNORMAL LOW (ref 26.0–34.0)
MCHC: 30.5 g/dL (ref 30.0–36.0)
MCV: 80.3 fL (ref 78.0–100.0)
METAMYELOCYTES PCT: 6 %
MONO ABS: 0.8 10*3/uL (ref 0.1–1.0)
MONOS PCT: 4 % (ref 3–12)
Myelocytes: 11 %
Neutro Abs: 13.5 10*3/uL — ABNORMAL HIGH (ref 1.7–7.7)
Neutrophils Relative %: 40 % — ABNORMAL LOW (ref 43–77)
Platelets: 382 10*3/uL (ref 150–400)
Promyelocytes Absolute: 1 %
RBC: 3.1 MIL/uL — AB (ref 4.22–5.81)
RDW: 19.9 % — ABNORMAL HIGH (ref 11.5–15.5)
WBC: 20.8 10*3/uL — AB (ref 4.0–10.5)
nRBC: 0 /100 WBC

## 2014-04-10 LAB — GLUCOSE, CAPILLARY: Glucose-Capillary: 112 mg/dL — ABNORMAL HIGH (ref 70–99)

## 2014-04-10 MED ORDER — GABAPENTIN 100 MG PO CAPS: 100.0000 mg | ORAL_CAPSULE | Freq: Every day | ORAL | Status: DC

## 2014-04-10 MED ORDER — PANTOPRAZOLE SODIUM 40 MG PO TBEC: 40.0000 mg | DELAYED_RELEASE_TABLET | Freq: Every day | ORAL | Status: DC

## 2014-04-10 MED ORDER — GLUCERNA SHAKE PO LIQD: 237.0000 mL | Freq: Two times a day (BID) | ORAL | Status: DC

## 2014-04-10 MED ORDER — ZOLPIDEM TARTRATE 5 MG PO TABS: 5.0000 mg | ORAL_TABLET | Freq: Every day | ORAL | Status: DC

## 2014-04-10 MED ORDER — HYDROCODONE-ACETAMINOPHEN 5-325 MG PO TABS: 1.0000 | ORAL_TABLET | Freq: Four times a day (QID) | ORAL | Status: DC | PRN

## 2014-04-10 MED ORDER — FUROSEMIDE 40 MG PO TABS
40.0000 mg | ORAL_TABLET | Freq: Two times a day (BID) | ORAL | Status: DC
Start: 2014-04-10 — End: 2014-11-29

## 2014-04-10 MED ORDER — SALINE SPRAY 0.65 % NA SOLN: 1.0000 | NASAL | Status: DC | PRN

## 2014-04-10 NOTE — Discharge Summary (Signed)
Physician Discharge Summary  Juan [name] WUJ:[id number] DOB: August 05, 1940 DOA: 03/29/2014  PCP: Juan November, MD  Admit date: 03/29/2014 Discharge date: 04/10/2014  Time spent: >30 minutes  Recommendations for Outpatient Follow-up:  Palliative care consult BMET in 1 week to follow electrolytes and renal function  Discharge Diagnoses:  Principal Problem:   Cellulitis Active Problems:   Obstructive sleep apnea   Leukocytosis   Sepsis   Morbid obesity due to excess calories   Malnutrition of moderate degree   Cellulitis of right lower leg   Venous stasis dermatitis of both lower extremities   Discharge Condition: stable and improved. Will discharge to SNF. Patient will benefit of follow up from palliative care and formal discussions regarding advance directives and goals of care.  Diet recommendation: heart healthy/low sodium diet (less than 2 grams daily)  Filed Weights   03/29/14 2130  Weight: 118.5 kg (261 lb 3.9 oz)    History of present illness:  72 y.o. male Who presents from home, he lives with his son. Patient has received 8 mg of IV morphine and is fairly lethargic. He is able to be aroused but unable to provide any meaningful history. History obtained from ER chart. The patient has chronic lower extremity edema right greater than left.for the last 1 months patient's and having increasing swelling and pain in his right foot and leg. Pain is so terrible that patient has been unable to bear weight. Patient does admit to chills but denies fever. In the ER, patient was noticed to be Desatting while sleeping into the 80s.the patient was found to have an increased lactic acid as well as an increased white blood cell count. His right leg was warm swollen and tender appearing as a cellulitis.of note patient had a lower sugar many ultrasound done in September 2015 negative for DVTs. There was noted to be a lot of swelling on the ultrasound.  Hospital Course:  1.  Cellulitis/sepsis/persistent fever; completed cefazolin per infectious disease recommendations MRI Shows diffuse edema of the leg and the foot, no abscess -switched to cefazolin 11/16 -11/22 renally dosed. Patient not known to have mrsa colonization.  -discontinued vanco and cefepime on 11/21 -chronic changes suggesting ; probable underlying chronic skin dermatitis  -following ID recommendations no further antibiotics is planned at this moment.  2. 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 [name] at [facility]. 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  -patient refused a bone marrow biopsy. The patient is non-compliant with appointments and has very poor understanding of disease process. [name] was recommended as a therapy. However, He failed to follow up at either [place].  -The patient is now too weak for systemic treatment and oncology does not recommend BM biopsy. -Oncology recommends palliative care consultation and supportive care; with second opinion for treatment if any by primary oncology at [facility] -Dr. [name] recommended that she has nothing to offer and that patient would need to see his oncologist in [facility] after discharge.  3. Sinus tachycardia most likely secondary to fever and blast crisis  -VQ scan low probability for PE;bilateral lower extremity Doppler negative for DVT -HR has now improved and in the mid to high 90;s range at discharge  4. Acute on chronic renal failure thought due ot ATN/sepsis -Creatinine at baseline around 1.4 -Renal ultrasound does not show any hydronephrosis, it shows atrophic  kidney and no obstruction -Uric acid level within normal limits -continue lasix BID; Cr is trending down (1.98 today 11/23) -follow BMET in 5 days as an outpatient   5. Chronic  diastolic heart failure  -Echocardiogram in January 2015 showed a grade 1 diastolic dysfunction with an EF of 55-60%  - Clinically compensated  -will continue lasix, low sodium diet, daily weight and low sodium diet -patient's LE swelling is chronic and most likely related with obesity   6. Anemia Secondary to chronic disease , hemoglobin trending down; continue to monitor CBC. -will need transfusion as needed for Hgb < 7.0  7. Thrombocytosis According to the patient, he had extensive investigations at [place] and was placed on anagrelide. -Anagrelide discontinued on 11/15 as his platelet counts were 179 and Hgb down to 7.8-8.0 range -oncology service in [place] do not have further treatment to offer and has recommended initiation of palliative and comfort care -patient still referring to and looking to follow in 4 months with oncology service at [facility] -visit/transportation to be arranged by family members and/or SNF  8. History of CAD  - Reviewed outpatient cardiology notes-had a normal Myoview in 2007.  - Stable overall, no chest pain or shortness of breath; Cardiac enzymes negative X2 -continue home medication regimen  9. Epistaxis Likely secondary to dysfunctional platelets, although platelet count is normal Started the patient on afrin, ocean nasal spray and ask patient not to pick on his nose. -no further episodes of epistaxis appreciated  10. Peripheral neuropathy, will continue gabapentin   Procedures:  None   Consultations:  Oncology   Psychiatry  Palliative Care  Discharge Exam: Filed Vitals:   04/10/14 1347  BP: 128/72  Pulse: 98  Temp: 98.1 F (36.7 C)  Resp: 18    General: Alert, awake and oriented X 3; no acute distress; has remained afebrile. Patient continue referring to follow in 4 months with oncologist at [facility]; is in agreement to go to SNF and has been found to have capacity to make decision base on psych  evaluation.  Cardiovascular: s1,s2 rrr  Respiratory: CTA BL  Abdomen: soft, nt,nd   Musculoskeletal: BL LE edema and chronic changes from stasis dermatitis, RLE with blisters; no signs of superimposed infection currently. Patient reports pain with movement   Discharge Instructions You were cared for by a hospitalist during your hospital stay. If you have any questions about your discharge medications or the care you received while you were in the hospital after you are discharged, you can call the unit and asked to speak with the hospitalist on call if the hospitalist that took care of you is not available. Once you are discharged, your primary care physician will handle any further medical issues. Please note that NO REFILLS for any discharge medications will be authorized once you are discharged, as it is imperative that you return to your primary care physician (or establish a relationship with a primary care physician if you do not have one) for your aftercare needs so that they can reassess your need for medications and monitor your lab values.   Current Discharge Medication List    START taking these medications   Details  feeding supplement, GLUCERNA SHAKE, (GLUCERNA SHAKE) LIQD Take 237 mLs by mouth 2 (two) times daily between meals. Refills: 0    gabapentin (NEURONTIN) 100 MG capsule Take 1 capsule (100 mg total) by mouth at bedtime.    pantoprazole (PROTONIX) 40 MG tablet Take 1 tablet (40 mg total) by  mouth daily.    sodium chloride (OCEAN) 0.65 % SOLN nasal spray Place 1-2 sprays into both nostrils every 4 (four) hours as needed for congestion. Refills: 0    zolpidem (AMBIEN) 5 MG tablet Take 1 tablet (5 mg total) by mouth at bedtime. Qty: 30 tablet, Refills: 0      CONTINUE these medications which have CHANGED   Details  furosemide (LASIX) 40 MG tablet Take 1 tablet (40 mg total) by mouth 2 (two) times daily.    HYDROcodone-acetaminophen (NORCO/VICODIN) 5-325 MG  per tablet Take 1 tablet by mouth every 6 (six) hours as needed for moderate pain. Qty: 30 tablet, Refills: 0      CONTINUE these medications which have NOT CHANGED   Details  acetaminophen (TYLENOL) 500 MG tablet Take 500-1,000 mg by mouth every 6 (six) hours as needed for moderate pain or headache.     b complex vitamins capsule Take 1 capsule by mouth daily. Qty: 30 capsule, Refills: 3   Associated Diagnoses: Anemia    Ferrous Sulfate Dried 200 (65 FE) MG TABS Take 1 tablet by mouth daily.     fluticasone (FLONASE) 50 MCG/ACT nasal spray Place 2 sprays into both nostrils daily.     folic acid (FOLVITE) 1 MG tablet Take 1 mg by mouth daily.     hydrocerin (EUCERIN) CREA Apply Eucerin cream to BLE Q day after bathing and roughly towel drying to remove loose skin Qty: 228 g, Refills: 0    potassium chloride SA (K-DUR,KLOR-CON) 20 MEQ tablet Take 20 mEq by mouth daily.      STOP taking these medications     anagrelide (AGRYLIN) 0.5 MG capsule      bacitracin 500 UNIT/GM ointment      lisinopril (PRINIVIL,ZESTRIL) 20 MG tablet        No Known Allergies   The results of significant diagnostics from this hospitalization (including imaging, microbiology, ancillary and laboratory) are listed below for reference.    Significant Diagnostic Studies: Mr Tibia Fibula Right Wo Contrast  04/01/2014   CLINICAL DATA:  Leg swelling. Cellulitis. Abscess. Initial encounter.  EXAM: MRI OF THE RIGHT FOREFOOT WITHOUT CONTRAST; MRI OF LOWER RIGHT EXTREMITY WITHOUT CONTRAST  TECHNIQUE: Multiplanar, multisequence MR imaging was performed. No intravenous contrast was administered.  COMPARISON:  No radiographs for comparison.  FINDINGS: Abnormal bone marrow signal is present in the tibia and fibula. Bone marrow signal shows heterogenous marrow. This is a nonspecific finding most commonly associated with obesity, anemia, cigarette smoking or chronic disease.  There is diffuse subcutaneous infiltration  circumferentially around the RIGHT leg, most compatible with cellulitis in the appropriate clinical setting but nonspecific. Fatty atrophy of the inferior medial head of the gastrocnemius is present. This is superimposed on mild diffuse atrophy of the leg. There is no deep soft tissue abscess. Edema of the muscular compartments may represent dependent or reactive edema. Infectious myositis is considered unlikely.  Evaluation of the foot shows similar findings with more prominent edema anterior to the ankle and in the dorsum of the foot. Minimal ankle effusion. Subtalar joints appear within normal limits. The Achilles tendon, posterior medial and posterior lateral tendon groups appear intact. No findings to suggest osteomyelitis. Hindfoot bones appear within normal limits.  IMPRESSION: Diffuse edema of the leg and foot, which is nonspecific but can represent cellulitis, dependent edema or lymphedema. No osteomyelitis or abscess.   Electronically Signed   By: Dereck Ligas M.D.   On: 04/01/2014 12:45   Mr Foot  Right Wo Contrast  04/01/2014   CLINICAL DATA:  Leg swelling. Cellulitis. Abscess. Initial encounter.  EXAM: MRI OF THE RIGHT FOREFOOT WITHOUT CONTRAST; MRI OF LOWER RIGHT EXTREMITY WITHOUT CONTRAST  TECHNIQUE: Multiplanar, multisequence MR imaging was performed. No intravenous contrast was administered.  COMPARISON:  No radiographs for comparison.  FINDINGS: Abnormal bone marrow signal is present in the tibia and fibula. Bone marrow signal shows heterogenous marrow. This is a nonspecific finding most commonly associated with obesity, anemia, cigarette smoking or chronic disease.  There is diffuse subcutaneous infiltration circumferentially around the RIGHT leg, most compatible with cellulitis in the appropriate clinical setting but nonspecific. Fatty atrophy of the inferior medial head of the gastrocnemius is present. This is superimposed on mild diffuse atrophy of the leg. There is no deep soft tissue  abscess. Edema of the muscular compartments may represent dependent or reactive edema. Infectious myositis is considered unlikely.  Evaluation of the foot shows similar findings with more prominent edema anterior to the ankle and in the dorsum of the foot. Minimal ankle effusion. Subtalar joints appear within normal limits. The Achilles tendon, posterior medial and posterior lateral tendon groups appear intact. No findings to suggest osteomyelitis. Hindfoot bones appear within normal limits.  IMPRESSION: Diffuse edema of the leg and foot, which is nonspecific but can represent cellulitis, dependent edema or lymphedema. No osteomyelitis or abscess.   Electronically Signed   By: Dereck Ligas M.D.   On: 04/01/2014 12:45   Nm Pulmonary Perf And Vent  03/31/2014   CLINICAL DATA:  Shortness of breath  EXAM: NUCLEAR MEDICINE VENTILATION - PERFUSION LUNG SCAN  TECHNIQUE: Ventilation images were obtained in multiple projections using inhaled aerosol technetium 99 M DTPA. Perfusion images were obtained in multiple projections after intravenous injection of Tc-16m MAA.  RADIOPHARMACEUTICALS:  40 mCi Tc-11m DTPA aerosol and 6 mCi Tc-4m MAA  COMPARISON:  None.  FINDINGS: Ventilation: Only limited ventilation images were obtained. They demonstrate no definitive ventilation defect. Patient could not tolerate laying on the table.  Perfusion: Limited per fusion views were obtained and demonstrate no perfusion defect. Again full imaging could not be performed due to patient's intolerance of positioning.  IMPRESSION: Extremely limited exam although no perfusion defect to suggest pulmonary embolism is noted.   Electronically Signed   By: Inez Catalina M.D.   On: 03/31/2014 14:25   US Renal Port  04/03/2014   CLINICAL DATA:  Renal failure, personal history of coronary artery disease, hypertension, CHF, type 2 diabetes, acute leukemia  EXAM: RENAL/URINARY TRACT ULTRASOUND COMPLETE  COMPARISON:  None  FINDINGS: Right Kidney:   Length: 12.8 cm. Cortical thinning. Upper cortical echogenicity. Hypoechoic nodule at inferior pole question minimally complicated cyst versus simple cyst with artifacts. No additional mass, hydronephrosis or shadowing calcification.  Left Kidney:  Length: 11.6 cm. Cortical thinning. Increased cortical echogenicity. No mass, hydronephrosis or shadowing calcification.  Bladder:  Large fluid containing structure with loculations in pelvis, suspect representing a large bladder with diverticula and trabeculations though cystic pelvic mass not completely excluded. Patient reportedly has a Foley catheter but this is not visualized.  Incidentally noted splenomegaly, 21.8 cm length with calculated volume 1260 mL.  IMPRESSION: Atrophic kidneys with question cyst mid inferior pole RIGHT kidney, uncertain if complicated versus simple with artifacts.  No evidence of hydronephrosis.  Large anechoic structure with multiple loculations in pelvis suspect representing a distended urinary bladder with multiple diverticula and bladder wall trabeculations though a cystic pelvic mass not entirely excluded since the patient's indwelling  Foley catheter is not localized ; noncontrast CT of the pelvis recommended to assess.  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   Electronically Signed   By: Lavonia Dana M.D.   On: 04/03/2014 19:59   Dg Chest Port 1 View  03/31/2014   CLINICAL DATA:  73 year old male with shortness of breath.  EXAM: PORTABLE CHEST - 1 VIEW  COMPARISON:  03/29/2014 and prior chest radiographs dating back to 08/04/2004  FINDINGS: The cardiomediastinal silhouette is unchanged.  Pulmonary vascular congestion and probable interstitial edema again noted.  Bilateral lower lung atelectasis/airspace disease again noted.  Increased bony density is again noted.  IMPRESSION: Little interval change since prior study with probable pulmonary edema and bilateral lower lung atelectasis/ airspace disease.    Electronically Signed   By: Hassan Rowan M.D.   On: 03/31/2014 14:24   Dg Chest Portable 1 View  03/29/2014   CLINICAL DATA:  Cellulitis. Cough and congestion. Shortness of breath. Chest pain. Nausea, vomiting, fever. History of hypertension and smoking.  EXAM: PORTABLE CHEST - 1 VIEW  COMPARISON:  01/26/2014  FINDINGS: Shallow inflation. Heart is enlarged. Mild pulmonary edema. There are no focal consolidations. Although not well evaluated on this portable view of the chest, the bones have a somewhat mottled lucent appearance raising the question of metabolic or metastatic disorder.  IMPRESSION: 1. Cardiomegaly and pulmonary edema. 2. Question of diffuse osseous changes. Recommend correlation with history and lab values. Consider further imaging if needed.   Electronically Signed   By: Shon Hale M.D.   On: 03/29/2014 18:01    Microbiology: Recent Results (from the past 240 hour(s))  Culture, blood (routine x 2)     Status: None   Collection Time: 03/31/14  6:36 PM  Result Value Ref Range Status   Specimen Description BLOOD LEFT HAND  Final   Special Requests BOTTLES DRAWN AEROBIC AND ANAEROBIC 5CC  Final   Culture  Setup Time   Final    04/01/2014 01:38 Performed at [place]   Final    NO GROWTH 5 DAYS Performed at [facility]    Report Status 04/07/2014 FINAL  Final  Culture, blood (routine x 2)     Status: None   Collection Time: 03/31/14  6:41 PM  Result Value Ref Range Status   Specimen Description BLOOD RIGHT ARM  Final   Special Requests BOTTLES DRAWN AEROBIC AND ANAEROBIC 5CC  Final   Culture  Setup Time   Final    04/01/2014 01:38 Performed at [place]   Final    NO GROWTH 5 DAYS Performed at [facility]    Report Status 04/07/2014 FINAL  Final     Labs: Basic Metabolic Panel:  Recent Labs Lab 04/04/14 0317 04/05/14 0305 04/06/14 0249 04/07/14 0545  NA 133* 141 140 140  K 4.8 4.6 4.7 4.9  CL 100  105 105 105  CO2 $Re'20 23 23 23  'fBU$ GLUCOSE 98 111* 116* 104*  BUN 43* 41* 37* 35*  CREATININE 2.44* 2.27* 2.13* 1.98*  CALCIUM 9.1 8.9 9.0 8.8   CBC:  Recent Labs Lab 04/06/14 0249 04/07/14 0545 04/08/14 0431 04/09/14 0640 04/10/14 0525  WBC 31.7* 27.1* 24.6* 24.2* 20.8*  NEUTROABS 26.9* 19.5* 17.2* 16.7* 13.5*  HGB 8.4* 8.0* 7.8* 8.0* 7.6*  HCT 26.6* 25.1* 25.3* 26.9* 24.9*  MCV 80.6 81.0 81.1 81.3 80.3  PLT 338 320 389 390 382   BNP (  last 3 results)  Recent Labs  12/09/13 0751 01/25/14 1115 03/29/14 1549  PROBNP 5630.0* 828.2* 6628.0*   CBG:  Recent Labs Lab 04/09/14 2148 04/10/14 0628  GLUCAP 110* 112*    Signed:  Barton Dubois  [facility] 04/10/2014, 2:53 PM

## 2014-04-10 NOTE — Progress Notes (Signed)
This is a very frustrating situation in which I fear I have nothing else I can do for Mr. Cauthon. We are having the same conversation everyday and he defers all decisions to someone else. He tells me about the "doctor" whom has no name that told him he has nothing to worry about. He refers to Dr. Dyann Kief as telling him he has nothing to worry about and has no reason to follow up at [facility] - even after I read to him directly from Dr. Anson Fret note. I told him today that it is his choice if he wants to look further into this likely leukemia or he can choose to focus on his comfort knowing this may cause his death. He of course continues to argue the case with no decisions made. I also told him that he may have waited too long and there may not be any treatment he can tolerate at this point anyway. He continues to be in denial. May continue palliative care at [facility].   Vinie Sill, NP Palliative Medicine Team Pager # [phone] (M-F 8a-5p) Team Phone # [phone] (Nights/Weekends)

## 2014-04-10 NOTE — Clinical Social Work Psychosocial (Cosign Needed)
Clinical Social Work Department BRIEF PSYCHOSOCIAL ASSESSMENT 04/10/2014  Patient:  Juan [name], Juan [name]     Account Number:  [id number]     Admit date:  03/29/2014  Clinical Social Worker:  Durward Fortes, CLINICAL SOCIAL WORKER  Date/Time:  04/10/2014 10:46 AM  Referred by:  Physician  Date Referred:  04/10/2014 Referred for  SNF Placement   Other Referral:   none.   Interview type:  Patient Other interview type:   none.    PSYCHOSOCIAL DATA Living Status:  ALONE Admitted from facility:   Level of care:   Primary support name:  Vernell Barrier Primary support relationship to patient:  CHILD, ADULT Degree of support available:   Adequate Support.    CURRENT CONCERNS Current Concerns  Post-Acute Placement   Other Concerns:   none.    SOCIAL WORK ASSESSMENT / PLAN CSW and BSW intern consulted regarding SNF placement for discharge once medically stable.    CSW and BSW intern met with patient at bedside regarding SNF placement. Patient made BSW intern aware that patient wanted to stay within the hospital to further rehab. Pt expressed concerns about being moved to a SNF and not being able to get in contact with family memebers once moved.    Pt mentioned being agreeable to a SNF placement if close to the hospital so that patient could still be close to family. Patient chose to go to [facility] in [place], [place]. [place] and BSW contacted patients daughter [name]) to give an update. Pt's daughter understanding and agreeable to plan of care.    CSW and BSW intern to continue to follow and assist with discharge planning needs.   Assessment/plan status:  Psychosocial Support/Ongoing Assessment of Needs Other assessment/ plan:   none.   Information/referral to community resources:   Pt.to be discharged to [facility] once medically stable.    PATIENT'S/FAMILY'S RESPONSE TO PLAN OF CARE: Pt. understanding and agreeable to CSW plan of care. Pt. expressed no  further questions or concerns at this time.       Virgie Dad Mayumi Summerson, BSW-Intern

## 2014-04-10 NOTE — Progress Notes (Signed)
NUTRITION FOLLOW UP  Pt meets criteria for moderate MALNUTRITION in the context of chronic illness as evidenced by <75% estimated nutritional intake x 1 month, mild fat and muscle depeltion.  Intervention:   -Continue Glucerna Shake po BID, each supplement provides 220 kcal and 10 grams of protein -Continue 30 ml Prostat BID, each supplement provides 100 kcals and 15 grams protein   Nutrition Dx:   Increased nutrient needs related to wound healing as evidenced by stage II pressure ulcer on sacrum, estimated nutritional needs; ongoing  Goal:   Pt will meet >90% of estimated nutritional needs; met  Monitor:   PO intake, labs, weight changes, I/O's  Assessment:   Juan [name] is a 73 y.o. male who presents from home, he lives with his son. Patient has received 8 mg of IV morphine and is fairly lethargic. He is able to be aroused but unable to provide any meaningful history. History obtained from ER chart. The patient has chronic lower extremity edema right greater than left.for the last 1 months patient's and having increasing swelling and pain in his right foot and leg. Pain is so terrible that patient has been unable to bear weight.  Pt reports his appetite has been improving just a little, however it is still decreased. Meal completion is 100%. Pt has been consuming his supplements. Will continue with current orders. Pt was encouraged to continue eating his food at meals and to drink his supplements.   Height: Ht Readings from Last 1 Encounters:  03/29/14 $RemoveB'6\' 2"'bKuqCjWu$  (1.88 m)    Weight Status:   Wt Readings from Last 1 Encounters:  03/29/14 261 lb 3.9 oz (118.5 kg)   Body mass index is 33.53 kg/(m^2). Class I obesity  Re-estimated needs:  Kcal: 2300-2500 Protein: 112-124 grams Fluid: 2.3-2.5 L  Skin: stage II pressure ulcer on lt buttocks, bilateral leg cellulitis, +3 LE edema  Diet Order: Diet Carb Modified   Intake/Output Summary (Last 24 hours) at 04/10/14 0948 Last data filed  at 04/10/14 0900  Gross per 24 hour  Intake    638 ml  Output    200 ml  Net    438 ml    Last BM: 04/08/14   Labs:   Recent Labs Lab 04/05/14 0305 04/06/14 0249 04/07/14 0545  NA 141 140 140  K 4.6 4.7 4.9  CL 105 105 105  CO2 $Re'23 23 23  'VtA$ BUN 41* 37* 35*  CREATININE 2.27* 2.13* 1.98*  CALCIUM 8.9 9.0 8.8  GLUCOSE 111* 116* 104*    CBG (last 3)   Recent Labs  04/09/14 2148 04/10/14 0628  GLUCAP 110* 112*    Scheduled Meds: . feeding supplement (GLUCERNA SHAKE)  237 mL Oral BID BM  . feeding supplement (PRO-STAT SUGAR FREE 64)  30 mL Oral BID BM  . furosemide  40 mg Oral BID  . gabapentin  100 mg Oral QHS  . heparin  5,000 Units Subcutaneous 3 times per day  . hydrocerin   Topical Daily  . oxymetazoline  2 spray Each Nare BID  . pantoprazole  40 mg Oral Daily  . sodium chloride  3 mL Intravenous Q12H  . zolpidem  5 mg Oral QHS    Continuous Infusions: . sodium chloride 10 mL/hr at 04/05/14 1000    Kallie Locks, [place], RD, LDN Pager # [phone] After hours/ weekend pager # [phone]

## 2014-04-10 NOTE — Progress Notes (Signed)
Report called to [facility]

## 2014-04-10 NOTE — Plan of Care (Signed)
Problem: Phase II Progression Outcomes Goal: Temperature < 101 Outcome: Completed/Met Date Met:  04/10/14     

## 2014-04-10 NOTE — Clinical Social Work Placement (Signed)
Clinical Social Work Department CLINICAL SOCIAL WORK PLACEMENT NOTE 04/10/2014  Patient:  Juan [name], Juan [name]  Account Number:  [id number] [place] date:  03/29/2014  Clinical Social Worker:  Wylene Men  Date/time:  04/10/2014 11:06 AM  Clinical Social Work is seeking post-discharge placement for this patient at the following level of care:   [place]   (*CSW will update this form in Epic as items are completed)   04/10/2014  Patient/family provided with [facility] Department of Clinical Social Work's list of facilities offering this level of care within the geographic area requested by the patient (or if unable, by the patient's family).  04/10/2014  Patient/family informed of their freedom to choose among providers that offer the needed level of care, that participate in Medicare, Medicaid or managed care program needed by the patient, have an available bed and are willing to accept the patient.  04/10/2014  Patient/family informed of [facility]' ownership interest in [facility], as well as of the fact that they are under no obligation to receive care at this facility.  PASARR submitted to EDS on 04/10/2014 PASARR number received on 04/10/2014  FL2 transmitted to all facilities in geographic area requested by pt/family on  04/10/2014 FL2 transmitted to all facilities within larger geographic area on   Patient informed that his/her managed care company has contracts with or will negotiate with  certain facilities, including the following:     Patient/family informed of bed offers received:  04/10/2014 Patient chooses bed at [facility] Physician recommends and patient chooses bed at    Patient to be transferred to [facility] on  04/10/2014 Patient to be transferred to facility by [facility] Patient and family notified of transfer on 04/10/2014 Name of family member notified:  daughter [name]  The following physician  request were entered in Epic:   Additional Comments:   Nonnie Done, [place] [phone]  Psychiatric & Orthopedics (5N 1-16) Clinical Social Worker

## 2014-04-14 ENCOUNTER — Observation Stay (HOSPITAL_COMMUNITY)
Admission: EM | Admit: 2014-04-14 | Discharge: 2014-04-16 | Disposition: A | Discharge status: Home / Self Care | Payer: Medicare Other | Attending: Internal Medicine | Admitting: Internal Medicine

## 2014-04-14 ENCOUNTER — Encounter (HOSPITAL_COMMUNITY): Payer: Self-pay

## 2014-04-14 DIAGNOSIS — D72829 Elevated white blood cell count, unspecified: Secondary | ICD-10-CM | POA: Diagnosis present

## 2014-04-14 DIAGNOSIS — I2510 Atherosclerotic heart disease of native coronary artery without angina pectoris: Secondary | ICD-10-CM | POA: Diagnosis present

## 2014-04-14 DIAGNOSIS — Z9981 Dependence on supplemental oxygen: Secondary | ICD-10-CM | POA: Insufficient documentation

## 2014-04-14 DIAGNOSIS — R531 Weakness: Secondary | ICD-10-CM

## 2014-04-14 DIAGNOSIS — M546 Pain in thoracic spine: Secondary | ICD-10-CM | POA: Insufficient documentation

## 2014-04-14 DIAGNOSIS — R079 Chest pain, unspecified: Secondary | ICD-10-CM | POA: Diagnosis not present

## 2014-04-14 DIAGNOSIS — G43909 Migraine, unspecified, not intractable, without status migrainosus: Secondary | ICD-10-CM | POA: Insufficient documentation

## 2014-04-14 DIAGNOSIS — R000 Tachycardia, unspecified: Secondary | ICD-10-CM | POA: Diagnosis not present

## 2014-04-14 DIAGNOSIS — K649 Unspecified hemorrhoids: Secondary | ICD-10-CM | POA: Insufficient documentation

## 2014-04-14 DIAGNOSIS — E119 Type 2 diabetes mellitus without complications: Secondary | ICD-10-CM | POA: Insufficient documentation

## 2014-04-14 DIAGNOSIS — M791 Myalgia: Secondary | ICD-10-CM | POA: Diagnosis not present

## 2014-04-14 DIAGNOSIS — Z87891 Personal history of nicotine dependence: Secondary | ICD-10-CM | POA: Insufficient documentation

## 2014-04-14 DIAGNOSIS — R630 Anorexia: Secondary | ICD-10-CM

## 2014-04-14 DIAGNOSIS — I517 Cardiomegaly: Secondary | ICD-10-CM | POA: Diagnosis not present

## 2014-04-14 DIAGNOSIS — Z872 Personal history of diseases of the skin and subcutaneous tissue: Secondary | ICD-10-CM | POA: Insufficient documentation

## 2014-04-14 DIAGNOSIS — I509 Heart failure, unspecified: Secondary | ICD-10-CM | POA: Insufficient documentation

## 2014-04-14 DIAGNOSIS — I10 Essential (primary) hypertension: Secondary | ICD-10-CM | POA: Insufficient documentation

## 2014-04-14 DIAGNOSIS — Z79899 Other long term (current) drug therapy: Secondary | ICD-10-CM | POA: Insufficient documentation

## 2014-04-14 DIAGNOSIS — G4733 Obstructive sleep apnea (adult) (pediatric): Secondary | ICD-10-CM | POA: Diagnosis present

## 2014-04-14 DIAGNOSIS — Z7951 Long term (current) use of inhaled steroids: Secondary | ICD-10-CM | POA: Insufficient documentation

## 2014-04-14 DIAGNOSIS — D649 Anemia, unspecified: Secondary | ICD-10-CM | POA: Insufficient documentation

## 2014-04-14 DIAGNOSIS — J9811 Atelectasis: Secondary | ICD-10-CM | POA: Diagnosis not present

## 2014-04-14 DIAGNOSIS — D7581 Myelofibrosis: Secondary | ICD-10-CM | POA: Diagnosis present

## 2014-04-14 LAB — I-STAT CHEM 8, ED
BUN: 23 mg/dL (ref 6–23)
CHLORIDE: 105 meq/L (ref 96–112)
CREATININE: 1.4 mg/dL — AB (ref 0.50–1.35)
Calcium, Ion: 1.17 mmol/L (ref 1.13–1.30)
GLUCOSE: 103 mg/dL — AB (ref 70–99)
HCT: 25 % — ABNORMAL LOW (ref 39.0–52.0)
Hemoglobin: 8.5 g/dL — ABNORMAL LOW (ref 13.0–17.0)
Potassium: 5 mEq/L (ref 3.7–5.3)
Sodium: 137 mEq/L (ref 137–147)
TCO2: 22 mmol/L (ref 0–100)

## 2014-04-14 LAB — I-STAT CG4 LACTIC ACID, ED: Lactic Acid, Venous: 0.38 mmol/L — ABNORMAL LOW (ref 0.5–2.2)

## 2014-04-14 LAB — I-STAT TROPONIN, ED: Troponin i, poc: 0.01 ng/mL (ref 0.00–0.08)

## 2014-04-14 LAB — TROPONIN I

## 2014-04-14 MED ORDER — ASPIRIN 81 MG PO CHEW
324.0000 mg | CHEWABLE_TABLET | Freq: Once | ORAL | Status: AC
  Administered 2014-04-14: 324 mg via ORAL
  Filled 2014-04-14: qty 4

## 2014-04-14 NOTE — ED Provider Notes (Signed)
CSN: [id number]     Arrival date & time 04/14/14  2224 History  This chart was scribed for Johnna Acosta, MD by Peyton Bottoms, ED Scribe. This patient was seen in room A11C/A11C and the patient's care was started at 11:01 PM.   Chief Complaint  Patient presents with  . Breast Pain   The history is provided by the patient. No language interpreter was used.    HPI Comments: Leviticus Harton is a 73 y.o. male with a history of hypertension, CHF, CAD, diabetes, edema in legs, who presents to the Emergency Department complaining of pain in right axillae area that began this morning when patient woke up. He states that he took pain medications and tried sleeping it off with no relief. He states that a cream was applied to affected area. He states that cream gave moderate pain relief. He currently complains of constant pain on upper right side of back. He denies associated nausea, emesis, fever. Patient had recent admission for cellulitis of his legs. Lower extremity dopplers were done 2 weeks ago. No DVT noted. Echogram was done 2 weeks ago. His EF was 50-55%. Moderate LVH. He had a VQ scan in the past 2 weeks with low probability of PE. Noted to be chronically anemic. Patient has had significant increase in WBC for the past 6 months.  Sx are constant, not worse with palpation or deep breathing, mild SOB with this - has chronic swelling of his legs - improved sincre prior admission.  Past Medical History  Diagnosis Date  . CAD (coronary artery disease)     dx elsewheer in past, no documentation. Non-ischemic myovue 2007  . CHF (congestive heart failure)     dx elsewhere, no documentation. Normal EF by previous echo. Trival AS needs f/u ECHO by 2012  . Hypertension   . Sleep apnea, obstructive     at some point used CPAP, was d/c  years ago  . Anemia   . History of thrombocytosis   . Allergic rhinitis   . Edema     R>L leg, u/s 5-12 neg for DVT  . Hemorrhoid   . Type II diabetes mellitus   .  Sinus congestion   . Migraine     "once/wk at least" (07/11/2013)  . Shortness of breath    Past Surgical History  Procedure Laterality Date  . Toe surgery Right     "tried to straighten out big toe" (07/11/2013)   Family History  Problem Relation Age of Onset  . Colon cancer Neg Hx   . Prostate cancer Neg Hx   . Heart attack Neg Hx   . Diabetes Neg Hx   . Schizophrenia Son    History  Substance Use Topics  . Smoking status: Former Smoker -- 0.25 packs/day for 12 years    Types: Cigarettes    Quit date: 05/18/1966  . Smokeless tobacco: Never Used  . Alcohol Use: No   Review of Systems  Musculoskeletal: Positive for myalgias.  All other systems reviewed and are negative.  Allergies  Review of patient's allergies indicates no known allergies.  Home Medications   Prior to Admission medications   Medication Sig Start Date End Date Taking? Authorizing Provider  acetaminophen (TYLENOL) 500 MG tablet Take 500-1,000 mg by mouth every 6 (six) hours as needed for moderate pain or headache.    Yes Historical Provider, MD  b complex vitamins capsule Take 1 capsule by mouth daily. 01/12/13  Yes Irene Pap, NP  feeding supplement, GLUCERNA SHAKE, (GLUCERNA SHAKE) LIQD Take 237 mLs by mouth 2 (two) times daily between meals. 04/10/14  Yes Barton Dubois, MD  Ferrous Sulfate Dried 200 (65 FE) MG TABS Take 1 tablet by mouth daily.    Yes Historical Provider, MD  fluticasone (FLONASE) 50 MCG/ACT nasal spray Place 2 sprays into both nostrils daily.    Yes Historical Provider, MD  folic acid (FOLVITE) 1 MG tablet Take 1 mg by mouth daily.    Yes Historical Provider, MD  furosemide (LASIX) 40 MG tablet Take 1 tablet (40 mg total) by mouth 2 (two) times daily. 04/10/14  Yes Barton Dubois, MD  gabapentin (NEURONTIN) 100 MG capsule Take 1 capsule (100 mg total) by mouth at bedtime. 04/10/14  Yes Barton Dubois, MD  hydrocerin (EUCERIN) CREA Apply Eucerin cream to BLE Q day after bathing and  roughly towel drying to remove loose skin 01/27/14  Yes Shanker Kristeen Mans, MD  HYDROcodone-acetaminophen (NORCO/VICODIN) 5-325 MG per tablet Take 1 tablet by mouth every 6 (six) hours as needed for moderate pain. 04/10/14  Yes Barton Dubois, MD  pantoprazole (PROTONIX) 40 MG tablet Take 1 tablet (40 mg total) by mouth daily. 04/10/14  Yes Barton Dubois, MD  potassium chloride SA (K-DUR,KLOR-CON) 20 MEQ tablet Take 20 mEq by mouth daily.   Yes Historical Provider, MD  sodium chloride (OCEAN) 0.65 % SOLN nasal spray Place 1-2 sprays into both nostrils every 4 (four) hours as needed for congestion. 04/10/14  Yes Barton Dubois, MD  zolpidem (AMBIEN) 5 MG tablet Take 1 tablet (5 mg total) by mouth at bedtime. 04/10/14  Yes Barton Dubois, MD   Triage Vitals: BP 118/80 mmHg  Pulse 117  Temp(Src) 98.1 F (36.7 C) (Oral)  Resp 24  SpO2 93%  Physical Exam  Constitutional: He appears well-developed and well-nourished. No distress.     HENT:  Head: Normocephalic and atraumatic.  Mouth/Throat: Oropharynx is clear and moist. No oropharyngeal exudate.  Eyes: Conjunctivae and EOM are normal. Pupils are equal, round, and reactive to light. Right eye exhibits no discharge. Left eye exhibits no discharge. No scleral icterus.  Neck: Normal range of motion. Neck supple. No JVD present. No thyromegaly present.  Cardiovascular: Regular rhythm, normal heart sounds and intact distal pulses.  Tachycardia present.  Exam reveals no gallop and no friction rub.   No murmur heard. Tachycardia, regular, no murmurs.  Pulmonary/Chest: Effort normal. No respiratory distress. He has no wheezes. He has rales. He exhibits no tenderness.  Scattered rales at the bases. No reproducible tenderness over the chest wall.   Abdominal: Soft. Bowel sounds are normal. He exhibits no distension and no mass. There is no tenderness.  Belly soft and nontender.   Musculoskeletal: Normal range of motion. He exhibits edema. He exhibits no  tenderness.  Symmetrical edema of the legs  Lymphadenopathy:    He has no cervical adenopathy.  Neurological: He is alert. Coordination normal.  Skin: Skin is warm and dry. No rash noted. No erythema.  Psychiatric: He has a normal mood and affect. His behavior is normal.  Nursing note and vitals reviewed.  ED Course  Procedures (including critical care time)  DIAGNOSTIC STUDIES: Oxygen Saturation is 93% on RA, low by my interpretation.    COORDINATION OF CARE: 11:35 PM- Discussed plans to order diagnostic EKG and lab work. Will give patient 324mg  chewable aspirin. Pt advised of plan for treatment and pt agrees.  Labs Review Labs Reviewed  CBC - Abnormal; Notable for the  following:    WBC 21.2 (*)    RBC 3.13 (*)    Hemoglobin 7.8 (*)    HCT 25.3 (*)    MCH 24.9 (*)    RDW 19.7 (*)    All other components within normal limits  PRO B NATRIURETIC PEPTIDE - Abnormal; Notable for the following:    Pro B Natriuretic peptide (BNP) 4369.0 (*)    All other components within normal limits  I-STAT CHEM 8, ED - Abnormal; Notable for the following:    Creatinine, Ser 1.40 (*)    Glucose, Bld 103 (*)    Hemoglobin 8.5 (*)    HCT 25.0 (*)    All other components within normal limits  I-STAT CG4 LACTIC ACID, ED - Abnormal; Notable for the following:    Lactic Acid, Venous 0.38 (*)    All other components within normal limits  TROPONIN I  Randolm Idol, ED    Imaging Review Dg Chest Port 1 View  04/15/2014   CLINICAL DATA:  73 year old male with chest pain  EXAM: PORTABLE CHEST - 1 VIEW  COMPARISON:  Prior chest x-ray 03/31/2014  FINDINGS: Stable cardiomegaly with left heart prominence. Atherosclerotic calcification present in the transverse aorta. Low inspiratory volumes with bibasilar opacities which are similar is slightly improved compared to prior. Diffuse central airway thickening and peribronchial cuffing with scattered perihilar atelectasis. No pneumothorax, pleural effusion  or new airspace consolidation. No acute osseous abnormality.  IMPRESSION: Slightly improved aeration compared to 03/31/2014.  Persistent very low inspiratory volumes with bibasilar atelectasis.  Cardiomegaly with pulmonary vascular congestion but no overt edema.   Electronically Signed   By: Jacqulynn Cadet M.D.   On: 04/15/2014 00:30     EKG Interpretation   Date/Time:  Saturday April 14 2014 22:32:17 EST Ventricular Rate:  126 PR Interval:  152 QRS Duration: 81 QT Interval:  308 QTC Calculation: 446 R Axis:   -18 Text Interpretation:  Sinus tachycardia Borderline left axis deviation  Nonspecific T wave abnormality Since last tracing rate faster Abnormal ekg  Confirmed by Kinzi Frediani  MD, Kaedance Magos ([id number]) on 04/14/2014 11:56:58 PM     MDM   Final diagnoses:  Chest pain  Leukocytosis    Pt has ongoing elevated WBC, he has xray without new abnormliaties, his tachycardia is persistent, he has ongoing R sided CP - will d/w hospitalist for admission.  R sided CP continues to be present - not made worse with palpation - labs without answer, xray without answer - d/w Dr. Arnoldo Morale who will admit for r/o.  Meds given in ED:  Medications  aspirin chewable tablet 324 mg (324 mg Oral Given 04/14/14 2327)      I personally performed the services described in this documentation, which was scribed in my presence. The recorded information has been reviewed and is accurate.  Johnna Acosta, MD 04/15/14 [id number]

## 2014-04-14 NOTE — ED Notes (Signed)
Per EMS pt has increased physical therapy and noticed increased pain to right sided breast area; no signs of trauma, denies fall; Pt c/o 6/10 pain on arrival; pt has hx of CHF; Pt from [facility]

## 2014-04-15 ENCOUNTER — Emergency Department (HOSPITAL_COMMUNITY): Payer: Medicare Other

## 2014-04-15 ENCOUNTER — Observation Stay (HOSPITAL_COMMUNITY): Payer: Medicare Other

## 2014-04-15 DIAGNOSIS — E118 Type 2 diabetes mellitus with unspecified complications: Secondary | ICD-10-CM | POA: Diagnosis not present

## 2014-04-15 DIAGNOSIS — D72829 Elevated white blood cell count, unspecified: Secondary | ICD-10-CM

## 2014-04-15 DIAGNOSIS — I10 Essential (primary) hypertension: Secondary | ICD-10-CM | POA: Diagnosis not present

## 2014-04-15 DIAGNOSIS — R079 Chest pain, unspecified: Secondary | ICD-10-CM | POA: Diagnosis not present

## 2014-04-15 DIAGNOSIS — G4733 Obstructive sleep apnea (adult) (pediatric): Secondary | ICD-10-CM | POA: Diagnosis not present

## 2014-04-15 DIAGNOSIS — I517 Cardiomegaly: Secondary | ICD-10-CM | POA: Diagnosis not present

## 2014-04-15 DIAGNOSIS — R918 Other nonspecific abnormal finding of lung field: Secondary | ICD-10-CM | POA: Diagnosis not present

## 2014-04-15 DIAGNOSIS — I25118 Atherosclerotic heart disease of native coronary artery with other forms of angina pectoris: Secondary | ICD-10-CM

## 2014-04-15 DIAGNOSIS — D471 Chronic myeloproliferative disease: Secondary | ICD-10-CM | POA: Diagnosis not present

## 2014-04-15 DIAGNOSIS — J9811 Atelectasis: Secondary | ICD-10-CM | POA: Diagnosis not present

## 2014-04-15 DIAGNOSIS — R000 Tachycardia, unspecified: Secondary | ICD-10-CM | POA: Diagnosis not present

## 2014-04-15 DIAGNOSIS — R072 Precordial pain: Secondary | ICD-10-CM

## 2014-04-15 DIAGNOSIS — I5032 Chronic diastolic (congestive) heart failure: Secondary | ICD-10-CM | POA: Diagnosis not present

## 2014-04-15 LAB — BASIC METABOLIC PANEL
Anion gap: 13 (ref 5–15)
BUN: 22 mg/dL (ref 6–23)
CHLORIDE: 103 meq/L (ref 96–112)
CO2: 23 mEq/L (ref 19–32)
Calcium: 9 mg/dL (ref 8.4–10.5)
Creatinine, Ser: 1.27 mg/dL (ref 0.50–1.35)
GFR, EST AFRICAN AMERICAN: 63 mL/min — AB (ref >90)
GFR, EST NON AFRICAN AMERICAN: 55 mL/min — AB (ref >90)
Glucose, Bld: 93 mg/dL (ref 70–99)
Potassium: 5 mEq/L (ref 3.7–5.3)
SODIUM: 139 meq/L (ref 137–147)

## 2014-04-15 LAB — CBC
HCT: 25.3 % — ABNORMAL LOW (ref 39.0–52.0)
HCT: 25.5 % — ABNORMAL LOW (ref 39.0–52.0)
HEMOGLOBIN: 7.8 g/dL — AB (ref 13.0–17.0)
HEMOGLOBIN: 7.8 g/dL — AB (ref 13.0–17.0)
MCH: 24.9 pg — AB (ref 26.0–34.0)
MCH: 24.8 pg — ABNORMAL LOW (ref 26.0–34.0)
MCHC: 30.8 g/dL (ref 30.0–36.0)
MCHC: 30.6 g/dL (ref 30.0–36.0)
MCV: 80.8 fL (ref 78.0–100.0)
MCV: 81 fL (ref 78.0–100.0)
PLATELETS: 409 10*3/uL — AB (ref 150–400)
Platelets: ADEQUATE 10*3/uL (ref 150–400)
RBC: 3.13 MIL/uL — AB (ref 4.22–5.81)
RBC: 3.15 MIL/uL — AB (ref 4.22–5.81)
RDW: 19.7 % — ABNORMAL HIGH (ref 11.5–15.5)
RDW: 20 % — ABNORMAL HIGH (ref 11.5–15.5)
WBC: 21.2 10*3/uL — ABNORMAL HIGH (ref 4.0–10.5)
WBC: 19.2 10*3/uL — ABNORMAL HIGH (ref 4.0–10.5)

## 2014-04-15 LAB — TROPONIN I
Troponin I: <0.3 ng/mL (ref <0.30)
Troponin I: <0.3 ng/mL (ref <0.30)

## 2014-04-15 LAB — PRO B NATRIURETIC PEPTIDE: PRO B NATRI PEPTIDE: 4369 pg/mL — AB (ref 0–125)

## 2014-04-15 LAB — D-DIMER, QUANTITATIVE: D-Dimer, Quant: 2.11 ug/mL-FEU — ABNORMAL HIGH (ref 0.00–0.48)

## 2014-04-15 MED ORDER — ALUM & MAG HYDROXIDE-SIMETH 200-200-20 MG/5ML PO SUSP: 30.0000 mL | Freq: Four times a day (QID) | ORAL | Status: DC | PRN

## 2014-04-15 MED ORDER — IOHEXOL 350 MG/ML SOLN
80.0000 mL | Freq: Once | INTRAVENOUS | Status: AC | PRN
  Administered 2014-04-15: 80 mL via INTRAVENOUS

## 2014-04-15 MED ORDER — ENOXAPARIN SODIUM 40 MG/0.4ML ~~LOC~~ SOLN
40.0000 mg | Freq: Every day | SUBCUTANEOUS | Status: DC
  Administered 2014-04-15 – 2014-04-16 (×2): 40 mg via SUBCUTANEOUS
  Filled 2014-04-15 (×2): qty 0.4

## 2014-04-15 MED ORDER — GABAPENTIN 100 MG PO CAPS
100.0000 mg | ORAL_CAPSULE | Freq: Every day | ORAL | Status: DC
Start: 2014-04-15 — End: 2014-04-16
  Administered 2014-04-15: 100 mg via ORAL
  Filled 2014-04-15: qty 1

## 2014-04-15 MED ORDER — SODIUM CHLORIDE 0.9 % IJ SOLN: 3.0000 mL | INTRAMUSCULAR | Status: DC | PRN

## 2014-04-15 MED ORDER — FOLIC ACID 1 MG PO TABS
1.0000 mg | ORAL_TABLET | Freq: Every day | ORAL | Status: DC
  Administered 2014-04-15 – 2014-04-16 (×2): 1 mg via ORAL
  Filled 2014-04-15 (×2): qty 1

## 2014-04-15 MED ORDER — FERROUS SULFATE 325 (65 FE) MG PO TABS
325.0000 mg | ORAL_TABLET | Freq: Every day | ORAL | Status: DC
  Administered 2014-04-15 – 2014-04-16 (×2): 325 mg via ORAL
  Filled 2014-04-15 (×2): qty 1

## 2014-04-15 MED ORDER — HYDROCOD POLST-CHLORPHEN POLST 10-8 MG/5ML PO LQCR
5.0000 mL | Freq: Once | ORAL | Status: AC
  Administered 2014-04-15: 5 mL via ORAL
  Filled 2014-04-15: qty 5

## 2014-04-15 MED ORDER — OXYCODONE HCL 5 MG PO TABS
5.0000 mg | ORAL_TABLET | ORAL | Status: DC | PRN
  Administered 2014-04-15: 5 mg via ORAL
  Filled 2014-04-15 (×2): qty 1

## 2014-04-15 MED ORDER — SODIUM CHLORIDE 0.9 % IV SOLN: 250.0000 mL | INTRAVENOUS | Status: DC | PRN

## 2014-04-15 MED ORDER — SODIUM CHLORIDE 0.9 % IJ SOLN
3.0000 mL | Freq: Two times a day (BID) | INTRAMUSCULAR | Status: DC
  Administered 2014-04-15 – 2014-04-16 (×3): 3 mL via INTRAVENOUS

## 2014-04-15 MED ORDER — ONDANSETRON HCL 4 MG PO TABS: 4.0000 mg | ORAL_TABLET | Freq: Four times a day (QID) | ORAL | Status: DC | PRN

## 2014-04-15 MED ORDER — ZOLPIDEM TARTRATE 5 MG PO TABS
5.0000 mg | ORAL_TABLET | Freq: Every day | ORAL | Status: DC
  Administered 2014-04-15: 5 mg via ORAL
  Filled 2014-04-15: qty 1

## 2014-04-15 MED ORDER — ONDANSETRON HCL 4 MG/2ML IJ SOLN: 4.0000 mg | Freq: Four times a day (QID) | INTRAMUSCULAR | Status: DC | PRN

## 2014-04-15 MED ORDER — ONDANSETRON HCL 4 MG/2ML IJ SOLN: 4.0000 mg | Freq: Three times a day (TID) | INTRAMUSCULAR | Status: DC | PRN

## 2014-04-15 MED ORDER — HYDROCERIN EX CREA
TOPICAL_CREAM | Freq: Every day | CUTANEOUS | Status: DC
  Administered 2014-04-15: 10:00:00 via TOPICAL
  Administered 2014-04-16: 1 via TOPICAL
  Filled 2014-04-15: qty 113

## 2014-04-15 MED ORDER — GLUCERNA SHAKE PO LIQD
237.0000 mL | Freq: Two times a day (BID) | ORAL | Status: DC
  Administered 2014-04-15: 237 mL via ORAL

## 2014-04-15 MED ORDER — NITROGLYCERIN 2 % TD OINT
0.5000 [in_us] | TOPICAL_OINTMENT | Freq: Four times a day (QID) | TRANSDERMAL | Status: DC
  Administered 2014-04-15 – 2014-04-16 (×3): 0.5 [in_us] via TOPICAL
  Filled 2014-04-15: qty 30

## 2014-04-15 MED ORDER — PANTOPRAZOLE SODIUM 40 MG PO TBEC
40.0000 mg | DELAYED_RELEASE_TABLET | Freq: Every day | ORAL | Status: DC
  Administered 2014-04-15 – 2014-04-16 (×2): 40 mg via ORAL
  Filled 2014-04-15 (×2): qty 1

## 2014-04-15 MED ORDER — ASPIRIN 325 MG PO TABS
325.0000 mg | ORAL_TABLET | Freq: Every day | ORAL | Status: DC
  Administered 2014-04-16: 325 mg via ORAL
  Filled 2014-04-15 (×2): qty 1

## 2014-04-15 MED ORDER — POTASSIUM CHLORIDE CRYS ER 20 MEQ PO TBCR
20.0000 meq | EXTENDED_RELEASE_TABLET | Freq: Every day | ORAL | Status: DC
  Administered 2014-04-15 – 2014-04-16 (×2): 20 meq via ORAL
  Filled 2014-04-15 (×2): qty 1

## 2014-04-15 MED ORDER — HYDROCODONE-ACETAMINOPHEN 5-325 MG PO TABS: 1.0000 | ORAL_TABLET | Freq: Four times a day (QID) | ORAL | Status: DC | PRN

## 2014-04-15 MED ORDER — SALINE SPRAY 0.65 % NA SOLN: 1.0000 | NASAL | Status: DC | PRN

## 2014-04-15 MED ORDER — B COMPLEX-C PO TABS
1.0000 | ORAL_TABLET | Freq: Every day | ORAL | Status: DC
  Administered 2014-04-15 – 2014-04-16 (×2): 1 via ORAL
  Filled 2014-04-15 (×2): qty 1

## 2014-04-15 MED ORDER — HYDROMORPHONE HCL 1 MG/ML IJ SOLN: 0.5000 mg | INTRAMUSCULAR | Status: DC | PRN

## 2014-04-15 MED ORDER — ACETAMINOPHEN 325 MG PO TABS: 650.0000 mg | ORAL_TABLET | Freq: Four times a day (QID) | ORAL | Status: DC | PRN

## 2014-04-15 MED ORDER — FUROSEMIDE 40 MG PO TABS
40.0000 mg | ORAL_TABLET | Freq: Two times a day (BID) | ORAL | Status: DC
Start: 2014-04-15 — End: 2014-04-16
  Administered 2014-04-15 – 2014-04-16 (×3): 40 mg via ORAL
  Filled 2014-04-15 (×4): qty 1

## 2014-04-15 MED ORDER — FLUTICASONE PROPIONATE 50 MCG/ACT NA SUSP
2.0000 | Freq: Every day | NASAL | Status: DC
  Administered 2014-04-15 – 2014-04-16 (×2): 2 via NASAL
  Filled 2014-04-15: qty 16

## 2014-04-15 MED ORDER — SODIUM CHLORIDE 0.9 % IJ SOLN
3.0000 mL | Freq: Two times a day (BID) | INTRAMUSCULAR | Status: DC
  Administered 2014-04-15 – 2014-04-16 (×4): 3 mL via INTRAVENOUS

## 2014-04-15 MED ORDER — NITROGLYCERIN 2 % TD OINT: 0.5000 [in_us] | TOPICAL_OINTMENT | Freq: Four times a day (QID) | TRANSDERMAL | Status: DC

## 2014-04-15 MED ORDER — ACETAMINOPHEN 650 MG RE SUPP: 650.0000 mg | Freq: Four times a day (QID) | RECTAL | Status: DC | PRN

## 2014-04-15 NOTE — Progress Notes (Signed)
STAT CTA for PE ordered, Samantha RN notified that a new IV access is needed for this exam- Pt currently has 22g in wrist- CTA requires a 20g or larger in the upper forearm or higher-Samantha will call when access obtained

## 2014-04-15 NOTE — Progress Notes (Signed)
Utilization Review Completed.   Valissa Lyvers, RN, BSN Nurse Case Manager  

## 2014-04-15 NOTE — H&P (Addendum)
[facility] Admission History and Physical       Nester Bachus GUY:[id number] DOB: January 14, 1941 DOA: 04/14/2014    Referring physician:  EDP PCP: [name], MD  Specialists:   Chief Complaint:  Right Sided Chest Pain   HPI: Juan [name] is a 73 y.o. male with a history of CAD, Diastolic CHF, HTN, DM2, Myelodysplastic Syndrome who was sent to the ED from the [facility] due to complaints of right sided chest pain radiating across his chest and under his right arm intermittently for the past 2-3 days.   He was evaluated in the ED and was found to have a negative troponin and EKG revealing Sinus Tachycardia.  He was referred for admission.       Review of Systems:  Constitutional: No Weight Loss, No Weight Gain, Night Sweats, Fevers, Chills, Dizziness, Fatigue, or Generalized Weakness HEENT: No Headaches, Difficulty Swallowing,Tooth/Dental Problems,Sore Throat,  No Sneezing, Rhinitis, Ear Ache, Nasal Congestion, or Post Nasal Drip,  Cardio-vascular:  No Chest pain, Orthopnea, PND, Edema in Lower Extremities, Anasarca, Dizziness, Palpitations  Resp: No Dyspnea, No DOE, No Productive Cough, No Non-Productive Cough, No Hemoptysis, No Wheezing.    GI: No Heartburn, Indigestion, Abdominal Pain, Nausea, Vomiting, Diarrhea, Hematemesis, Hematochezia, Melena, Change in Bowel Habits,  Loss of Appetite  GU: No Dysuria, Change in Color of Urine, No Urgency or Frequency, No Flank pain.  Musculoskeletal: No Joint Pain or Swelling, No Decreased Range of Motion, No Back Pain.  Neurologic: No Syncope, No Seizures, Muscle Weakness, Paresthesia, Vision Disturbance or Loss, No Diplopia, No Vertigo, No Difficulty Walking,  Skin: No Rash or Lesions. Psych: No Change in Mood or Affect, No Depression or Anxiety, No Memory loss, No Confusion, or Hallucinations   Past Medical History  Diagnosis Date  . CAD (coronary artery disease)     dx elsewheer in past, no documentation. Non-ischemic myovue  2007  . CHF (congestive heart failure)     dx elsewhere, no documentation. Normal EF by previous echo. Trival AS needs f/u ECHO by 2012  . Hypertension   . Sleep apnea, obstructive     at some point used CPAP, was d/c  years ago  . Anemia   . History of thrombocytosis   . Allergic rhinitis   . Edema     R>L leg, u/s 5-12 neg for DVT  . Hemorrhoid   . Type II diabetes mellitus   . Sinus congestion   . Migraine     "once/wk at least" (07/11/2013)  . Shortness of breath       Past Surgical History  Procedure Laterality Date  . Toe surgery Right     "tried to straighten out big toe" (07/11/2013)       Prior to Admission medications   Medication Sig Start Date End Date Taking? Authorizing Provider  acetaminophen (TYLENOL) 500 MG tablet Take 500-1,000 mg by mouth every 6 (six) hours as needed for moderate pain or headache.    Yes Historical Provider, MD  b complex vitamins capsule Take 1 capsule by mouth daily. 01/12/13  Yes Irene Pap, NP  feeding supplement, GLUCERNA SHAKE, (GLUCERNA SHAKE) LIQD Take 237 mLs by mouth 2 (two) times daily between meals. 04/10/14  Yes Barton Dubois, MD  Ferrous Sulfate Dried 200 (65 FE) MG TABS Take 1 tablet by mouth daily.    Yes Historical Provider, MD  fluticasone (FLONASE) 50 MCG/ACT nasal spray Place 2 sprays into both nostrils daily.    Yes Historical Provider, MD  folic acid (FOLVITE) 1 MG tablet Take 1 mg by mouth daily.    Yes Historical Provider, MD  furosemide (LASIX) 40 MG tablet Take 1 tablet (40 mg total) by mouth 2 (two) times daily. 04/10/14  Yes Barton Dubois, MD  gabapentin (NEURONTIN) 100 MG capsule Take 1 capsule (100 mg total) by mouth at bedtime. 04/10/14  Yes Barton Dubois, MD  hydrocerin (EUCERIN) CREA Apply Eucerin cream to BLE Q day after bathing and roughly towel drying to remove loose skin 01/27/14  Yes Shanker Kristeen Mans, MD  HYDROcodone-acetaminophen (NORCO/VICODIN) 5-325 MG per tablet Take 1 tablet by mouth every 6 (six)  hours as needed for moderate pain. 04/10/14  Yes Barton Dubois, MD  pantoprazole (PROTONIX) 40 MG tablet Take 1 tablet (40 mg total) by mouth daily. 04/10/14  Yes Barton Dubois, MD  potassium chloride SA (K-DUR,KLOR-CON) 20 MEQ tablet Take 20 mEq by mouth daily.   Yes Historical Provider, MD  sodium chloride (OCEAN) 0.65 % SOLN nasal spray Place 1-2 sprays into both nostrils every 4 (four) hours as needed for congestion. 04/10/14  Yes Barton Dubois, MD  zolpidem (AMBIEN) 5 MG tablet Take 1 tablet (5 mg total) by mouth at bedtime. 04/10/14  Yes Barton Dubois, MD      No Known Allergies   Social History:  reports that he quit smoking about 47 years ago. His smoking use included Cigarettes. He has a 3 pack-year smoking history. He has never used smokeless tobacco. He reports that he does not drink alcohol or use illicit drugs.     Family History  Problem Relation Age of Onset  . Colon cancer Neg Hx   . Prostate cancer Neg Hx   . Heart attack Neg Hx   . Diabetes Neg Hx   . Schizophrenia Son        Physical Exam:  GEN:  Pleasant Obese Elderly  73 y.o. African American male examined  and in no acute distress; cooperative with exam Filed Vitals:   04/15/14 0130 04/15/14 0200 04/15/14 0230 04/15/14 0335  BP: 117/80 123/76 133/79 129/76  Pulse: 116 119 117 113  Temp:    98.3 F (36.8 C)  TempSrc:    Oral  Resp: 19 20 19 19   Height:    6\' 5"  (1.956 m)  Weight:    108.863 kg (240 lb)  SpO2: 93% 91% 93% 94%   Blood pressure 129/76, pulse 113, temperature 98.3 F (36.8 C), temperature source Oral, resp. rate 19, height 6\' 5"  (1.956 m), weight 108.863 kg (240 lb), SpO2 94 %. PSYCH: He is alert and oriented x4; does not appear anxious does not appear depressed; affect is normal HEENT: Normocephalic and Atraumatic, Mucous membranes pink; PERRLA; EOM intact; Fundi:  Benign;  No scleral icterus, Nares: Patent, Oropharynx: Clear, Fair Dentition,    Neck:  FROM, No Cervical Lymphadenopathy  nor Thyromegaly or Carotid Bruit; No JVD; Breasts:: Not examined CHEST WALL: No tenderness CHEST: Normal respiration, clear to auscultation bilaterally HEART: Regular rate and rhythm; no murmurs rubs or gallops BACK: No kyphosis or scoliosis; No CVA tenderness ABDOMEN: Positive Bowel Sounds,  Soft Non-Tender; No Masses, No Organomegaly. Rectal Exam: Not done EXTREMITIES: No  Cyanosis, Clubbing, Chronic Venous Stasis Changes of BLEs. Genitalia: not examined PULSES: 2+ and symmetric SKIN: Normal hydration no rash or ulceration CNS:  Alert and Oriented x 4, No Focal Deficits Generalized Weakness  Vascular: pulses palpable throughout    Labs on Admission:  Basic Metabolic Panel:  Recent Labs Lab  04/14/14 2340  NA 137  K 5.0  CL 105  GLUCOSE 103*  BUN 23  CREATININE 1.40*   Liver Function Tests: No results for input(s): AST, ALT, ALKPHOS, BILITOT, PROT, ALBUMIN in the last 168 hours. No results for input(s): LIPASE, AMYLASE in the last 168 hours. No results for input(s): AMMONIA in the last 168 hours. CBC:  Recent Labs Lab 04/08/14 0431 04/09/14 0640 04/10/14 0525 04/14/14 2327 04/14/14 2340  WBC 24.6* 24.2* 20.8* 21.2*  --   NEUTROABS 17.2* 16.7* 13.5*  --   --   HGB 7.8* 8.0* 7.6* 7.8* 8.5*  HCT 25.3* 26.9* 24.9* 25.3* 25.0*  MCV 81.1 81.3 80.3 80.8  --   PLT 389 390 382 PLATELET CLUMPS NOTED ON SMEAR, COUNT APPEARS ADEQUATE  --    Cardiac Enzymes:  Recent Labs Lab 04/14/14 2327  TROPONINI <0.30    BNP (last 3 results)  Recent Labs  01/25/14 1115 03/29/14 1549 04/14/14 2335  PROBNP 828.2* 6628.0* 4369.0*   CBG:  Recent Labs Lab 04/09/14 2148 04/10/14 0628  GLUCAP 110* 112*    Radiological Exams on Admission: Dg Chest Port 1 View  04/15/2014   CLINICAL DATA:  73 year old male with chest pain  EXAM: PORTABLE CHEST - 1 VIEW  COMPARISON:  Prior chest x-ray 03/31/2014  FINDINGS: Stable cardiomegaly with left heart prominence. Atherosclerotic  calcification present in the transverse aorta. Low inspiratory volumes with bibasilar opacities which are similar is slightly improved compared to prior. Diffuse central airway thickening and peribronchial cuffing with scattered perihilar atelectasis. No pneumothorax, pleural effusion or new airspace consolidation. No acute osseous abnormality.  IMPRESSION: Slightly improved aeration compared to 03/31/2014.  Persistent very low inspiratory volumes with bibasilar atelectasis.  Cardiomegaly with pulmonary vascular congestion but no overt edema.   Electronically Signed   By: Jacqulynn Cadet M.D.   On: 04/15/2014 00:30     EKG: Independently reviewed. Sinus Tachycardia rate 126   Assessment/Plan:   73 y.o. male with  Principal Problem:   1.    Chest pain   Telemetry Monitoring   Cycle Troponins   Nitropaste, O2, , ASA   Check D-Dimer  Active Problems:   2.   Coronary atherosclerosis   Cardiac Workup Initiated          3.   Chronic diastolic CHF (congestive heart failure)   On Lasix and KCl     4.   Type II diabetes mellitus   SSI Coverage PRN   Check HbA1c in AM     5.   Essential hypertension   Monitor BPs     6.   Anemia- due to Myelodysplastic Syndrome   Send Anemia Panel       7.   Myeloproliferative disease   Chronic      8.  Obstructive sleep apnea   Monitor O2 sats        9.  Chronic lower extremity edema, stasis dermatitis   Continue Lasix Rx   10.  Leukocytosis   Monitor Trend   11.  DVT Prophylaxis   Lovenox    ADDENDUM:    12.  Elevated D-dimer-  at   2.21   Send for CTA chest to Rule out PE   And Venous Duplex US of BLEs to rule out DVT(s)    Code Status:   FULL CODE    Family Communication:    No Family Present Disposition Plan:     Observation Telemetry    Time spent: 60 Minutes  Theressa Millard [facility] Pager [phone]   If [place] Please Contact the Day Rounding Team MD for [facility]  If 7PM-7AM,  Please Contact Night-Floor Coverage  [email or web address] Password [facility] 04/15/2014, 4:28 AM

## 2014-04-15 NOTE — Progress Notes (Signed)
[facility] PROGRESS NOTE  Adolf Ormiston GYI:[id number] DOB: 06-Oct-1940 DOA: 04/14/2014 PCP: [name], MD    brief narrative 73 y.o. male with a history of CAD, Diastolic CHF, HTN, DM2, Myelodysplastic Syndrome who was sent to the ED from the [facility] due to complaints of right sided chest pain radiating across his chest and under his right arm intermittently for the past 2-3 days. He was recently hospitalized with cellulitis of right leg with sepsis and discharged to SNF. patient was tachycardic in ED and D dimer was elevated. He underwent CT angiogram of the chest which was negative for PE.    Assessment/Plan: Chest pain  symptoms appears atypical, possibly musculoskeletal and localized to right rib area. No further symptom at present. Serial CE to r/o ACS. Monitor on tele  CT angiogram negative for PE doppler LE to r/o DVT was checked 2 weeks back and was negative. Reports having pain in right ankle and increased RLE swelling now. Prn vicodin for pain control.  CKD stage 3 Stable. ATN during recent hospitalization Continue lasix bid  Chr diastolic CHF  euvolemic. continue lasix. Has chronic LE swelling.   Hx of CAD  stable. Recent 2d echo unremarkable. continuer SA  Ascending aortic aneurysm  incidental finding of 4.3 cm ascending aortic aneurysm. Spoke with [facility] Dr Roxan Hockey. His office will call to  follow up with pt in 6 months with follow up imaging.  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 Stable currently  myelodisplasia vs acute leukemia Pt has persistent leucocytosis. As per oncology consult note from 2 weeks back ( Dr [name]), pt was evaluated in July 2015 by her and Dr [name] at [facility]. Negative for JAK-2 mutation in may 2015. At the time, acute leukemia or high grade MDS was suspected, based on peripheral blood smear and increased blasts.  He refused a bone marrow biopsy on repeated request. Oncology signed off and  recommended no further w/up at this time    Protein calorie malnutrition continue supplements   Goals of care Seen by palliative care during last hospitalization. patient wanted full scope of treatment.     Diet: heart healthy  DVT prophylaxis: sq lovenox      Code Status: full code Family Communication: none at bedside Disposition Plan: PT eval. Repeat doppler LE pending. Return to [facility] tomorrow if stable   Consultants:  none  Procedures:  CT angiogram chest  Antibiotics:  none  HPI/Subjective: Pt seen and examined. Denies further chest pain symptoms. No further rt ankle pain  Objective: Filed Vitals:   04/15/14 0954  BP: 117/78  Pulse: 125  Temp: 98 F (36.7 C)  Resp: 18   No intake or output data in the 24 hours ending 04/15/14 1032 Filed Weights   04/15/14 0335  Weight: 108.863 kg (240 lb)    Exam:   General:  elderly male in NAD  HEENT: moist mucosa  Chest: clear b/l, no added sounds   CVS: N S1&S2, no murmurs  Abd: soft, NT, ND, BS+  Ext: pitting edema b/l LE ( R>L) with chronic skin changes  CNS: alert and oriented  Data Reviewed: Basic Metabolic Panel:  Recent Labs Lab 04/14/14 2340 04/15/14 0406  NA 137 139  K 5.0 5.0  CL 105 103  CO2  --  23  GLUCOSE 103* 93  BUN 23 22  CREATININE 1.40* 1.27  CALCIUM  --  9.0   Liver Function Tests: No results for input(s): AST, ALT,  ALKPHOS, BILITOT, PROT, ALBUMIN in the last 168 hours. No results for input(s): LIPASE, AMYLASE in the last 168 hours. No results for input(s): AMMONIA in the last 168 hours. CBC:  Recent Labs Lab 04/09/14 0640 04/10/14 0525 04/14/14 2327 04/14/14 2340 04/15/14 0406  WBC 24.2* 20.8* 21.2*  --  19.2*  NEUTROABS 16.7* 13.5*  --   --   --   HGB 8.0* 7.6* 7.8* 8.5* 7.8*  HCT 26.9* 24.9* 25.3* 25.0* 25.5*  MCV 81.3 80.3 80.8  --  81.0  PLT 390 382 PLATELET CLUMPS NOTED ON SMEAR, COUNT APPEARS ADEQUATE  --  409*   Cardiac  Enzymes:  Recent Labs Lab 04/14/14 2327 04/15/14 0406 04/15/14 0910  TROPONINI <0.30 <0.30 <0.30   BNP (last 3 results)  Recent Labs  01/25/14 1115 03/29/14 1549 04/14/14 2335  PROBNP 828.2* 6628.0* 4369.0*   CBG:  Recent Labs Lab 04/09/14 2148 04/10/14 0628  GLUCAP 110* 112*    No results found for this or any previous visit (from the past 240 hour(s)).   Studies: Ct Angio Chest Pe W/cm &/or Wo Cm  04/15/2014   CLINICAL DATA:  73 year old with right chest pain and tachycardia. Cellulitis.  EXAM: CT ANGIOGRAPHY CHEST WITH CONTRAST  TECHNIQUE: Multidetector CT imaging of the chest was performed using the standard protocol during bolus administration of intravenous contrast. Multiplanar CT image reconstructions and MIPs were obtained to evaluate the vascular anatomy.  CONTRAST:  78mL OMNIPAQUE IOHEXOL 350 MG/ML SOLN  COMPARISON:  Chest radiograph 04/15/2014  FINDINGS: No evidence for a pulmonary embolism. The main pulmonary artery is enlarged, measuring 4.9 cm. Ascending thoracic aorta is enlarged measuring 4.3 cm. Descending thoracic aorta measures up to 3.6 cm. Right hemidiaphragm is elevated.  Small amount of fluid in pericardial recess. There are small lymph nodes in the mediastinum. Overall, no significant chest lymphadenopathy. Spleen appears to be enlarged measuring 15.9 cm in the AP dimension. Difficult to exclude perisplenic fluid.  The trachea and mainstem bronchi are patent. There is volume loss in the right lower lobe with a large linear density presumed to represent subsegmental atelectasis. Scattered areas of ground-glass opacities throughout both lungs could represent edema versus air trapping. No large areas of consolidation or airspace disease. Diffuse sclerosis of the bones with small scattered areas of bone lucency.  Review of the MIP images confirms the above findings.  IMPRESSION: Negative for pulmonary embolism.  Enlarged pulmonary arteries suggestive for pulmonary  hypertension.  Splenomegaly.  Scattered areas of ground-glass throughout both lungs. Differential would include patchy edema versus areas of air trapping.  Elevated right hemidiaphragm.  Diffuse sclerosis throughout the bones. Reportedly, patient has a history of myoproliferative disease and this could account for this finding. Prostate cancer could also have this appearance and recommend clinical correlation.  Ascending thoracic aorta measures up to 4.3 cm consistent with an ascending thoracic aortic aneurysm. Recommend surveillance imaging followup by CTA or MRA and referral to cardiothoracic surgery if not already obtained. This recommendation follows 2010 ACCF/AHA/AATS/ACR/ASA/SCA/SCAI/SIR/STS/SVM Guidelines for the Diagnosis and Management of Patients With Thoracic Aortic Disease.  Circulation. 2010; 121: Z610-R604   Electronically Signed   By: Markus Daft M.D.   On: 04/15/2014 09:31   Dg Chest Port 1 View  04/15/2014   CLINICAL DATA:  73 year old male with chest pain  EXAM: PORTABLE CHEST - 1 VIEW  COMPARISON:  Prior chest x-ray 03/31/2014  FINDINGS: Stable cardiomegaly with left heart prominence. Atherosclerotic calcification present in the transverse aorta. Low inspiratory volumes with  bibasilar opacities which are similar is slightly improved compared to prior. Diffuse central airway thickening and peribronchial cuffing with scattered perihilar atelectasis. No pneumothorax, pleural effusion or new airspace consolidation. No acute osseous abnormality.  IMPRESSION: Slightly improved aeration compared to 03/31/2014.  Persistent very low inspiratory volumes with bibasilar atelectasis.  Cardiomegaly with pulmonary vascular congestion but no overt edema.   Electronically Signed   By: Jacqulynn Cadet M.D.   On: 04/15/2014 00:30    Scheduled Meds: . aspirin  325 mg Oral Daily  . B-complex with vitamin C  1 tablet Oral Daily  . enoxaparin (LOVENOX) injection  40 mg Subcutaneous Daily  . feeding  supplement (GLUCERNA SHAKE)  237 mL Oral BID BM  . ferrous sulfate  325 mg Oral Q breakfast  . fluticasone  2 spray Each Nare Daily  . folic acid  1 mg Oral Daily  . furosemide  40 mg Oral BID  . gabapentin  100 mg Oral QHS  . hydrocerin   Topical Daily  . nitroGLYCERIN  0.5 inch Topical 4 times per day  . pantoprazole  40 mg Oral Daily  . potassium chloride SA  20 mEq Oral Daily  . sodium chloride  3 mL Intravenous Q12H  . sodium chloride  3 mL Intravenous Q12H  . zolpidem  5 mg Oral QHS   Continuous Infusions:    Time spent:25 minutes    Allona Gondek  [facility] Pager [phone] If 7PM-7AM, please contact night-coverage at [email or web address], password [facility] 04/15/2014, 10:32 AM  LOS: 1 day

## 2014-04-16 DIAGNOSIS — G4733 Obstructive sleep apnea (adult) (pediatric): Secondary | ICD-10-CM

## 2014-04-16 DIAGNOSIS — I509 Heart failure, unspecified: Secondary | ICD-10-CM | POA: Diagnosis not present

## 2014-04-16 DIAGNOSIS — I5032 Chronic diastolic (congestive) heart failure: Secondary | ICD-10-CM

## 2014-04-16 DIAGNOSIS — G629 Polyneuropathy, unspecified: Secondary | ICD-10-CM | POA: Diagnosis not present

## 2014-04-16 DIAGNOSIS — I5020 Unspecified systolic (congestive) heart failure: Secondary | ICD-10-CM | POA: Diagnosis not present

## 2014-04-16 DIAGNOSIS — I25118 Atherosclerotic heart disease of native coronary artery with other forms of angina pectoris: Secondary | ICD-10-CM | POA: Diagnosis not present

## 2014-04-16 DIAGNOSIS — R000 Tachycardia, unspecified: Secondary | ICD-10-CM | POA: Diagnosis not present

## 2014-04-16 DIAGNOSIS — M791 Myalgia: Secondary | ICD-10-CM | POA: Diagnosis not present

## 2014-04-16 DIAGNOSIS — D471 Chronic myeloproliferative disease: Secondary | ICD-10-CM | POA: Diagnosis not present

## 2014-04-16 DIAGNOSIS — L89312 Pressure ulcer of right buttock, stage 2: Secondary | ICD-10-CM | POA: Diagnosis not present

## 2014-04-16 DIAGNOSIS — E119 Type 2 diabetes mellitus without complications: Secondary | ICD-10-CM | POA: Diagnosis not present

## 2014-04-16 DIAGNOSIS — M6281 Muscle weakness (generalized): Secondary | ICD-10-CM | POA: Diagnosis not present

## 2014-04-16 DIAGNOSIS — D649 Anemia, unspecified: Secondary | ICD-10-CM | POA: Diagnosis not present

## 2014-04-16 DIAGNOSIS — N183 Chronic kidney disease, stage 3 (moderate): Secondary | ICD-10-CM | POA: Diagnosis not present

## 2014-04-16 DIAGNOSIS — Z9119 Patient's noncompliance with other medical treatment and regimen: Secondary | ICD-10-CM | POA: Diagnosis not present

## 2014-04-16 DIAGNOSIS — D72829 Elevated white blood cell count, unspecified: Secondary | ICD-10-CM | POA: Diagnosis not present

## 2014-04-16 DIAGNOSIS — G4700 Insomnia, unspecified: Secondary | ICD-10-CM | POA: Diagnosis not present

## 2014-04-16 DIAGNOSIS — I10 Essential (primary) hypertension: Secondary | ICD-10-CM

## 2014-04-16 DIAGNOSIS — R609 Edema, unspecified: Secondary | ICD-10-CM

## 2014-04-16 DIAGNOSIS — J309 Allergic rhinitis, unspecified: Secondary | ICD-10-CM | POA: Diagnosis not present

## 2014-04-16 DIAGNOSIS — I872 Venous insufficiency (chronic) (peripheral): Secondary | ICD-10-CM | POA: Diagnosis not present

## 2014-04-16 DIAGNOSIS — I712 Thoracic aortic aneurysm, without rupture: Secondary | ICD-10-CM | POA: Diagnosis not present

## 2014-04-16 DIAGNOSIS — D469 Myelodysplastic syndrome, unspecified: Secondary | ICD-10-CM | POA: Diagnosis not present

## 2014-04-16 DIAGNOSIS — E46 Unspecified protein-calorie malnutrition: Secondary | ICD-10-CM | POA: Diagnosis not present

## 2014-04-16 DIAGNOSIS — D638 Anemia in other chronic diseases classified elsewhere: Secondary | ICD-10-CM | POA: Diagnosis not present

## 2014-04-16 DIAGNOSIS — L89213 Pressure ulcer of right hip, stage 3: Secondary | ICD-10-CM | POA: Diagnosis not present

## 2014-04-16 DIAGNOSIS — I2510 Atherosclerotic heart disease of native coronary artery without angina pectoris: Secondary | ICD-10-CM | POA: Diagnosis not present

## 2014-04-16 DIAGNOSIS — G43909 Migraine, unspecified, not intractable, without status migrainosus: Secondary | ICD-10-CM | POA: Diagnosis not present

## 2014-04-16 DIAGNOSIS — E875 Hyperkalemia: Secondary | ICD-10-CM | POA: Diagnosis not present

## 2014-04-16 DIAGNOSIS — R079 Chest pain, unspecified: Secondary | ICD-10-CM | POA: Diagnosis not present

## 2014-04-16 DIAGNOSIS — R072 Precordial pain: Secondary | ICD-10-CM | POA: Diagnosis not present

## 2014-04-16 DIAGNOSIS — I129 Hypertensive chronic kidney disease with stage 1 through stage 4 chronic kidney disease, or unspecified chronic kidney disease: Secondary | ICD-10-CM | POA: Diagnosis not present

## 2014-04-16 DIAGNOSIS — D7289 Other specified disorders of white blood cells: Secondary | ICD-10-CM | POA: Diagnosis not present

## 2014-04-16 DIAGNOSIS — I714 Abdominal aortic aneurysm, without rupture: Secondary | ICD-10-CM | POA: Diagnosis not present

## 2014-04-16 LAB — BASIC METABOLIC PANEL
Anion gap: 12 (ref 5–15)
BUN: 19 mg/dL (ref 6–23)
CO2: 23 mEq/L (ref 19–32)
Calcium: 9 mg/dL (ref 8.4–10.5)
Chloride: 105 mEq/L (ref 96–112)
Creatinine, Ser: 1.21 mg/dL (ref 0.50–1.35)
GFR calc Af Amer: 67 mL/min — ABNORMAL LOW (ref >90)
GFR, EST NON AFRICAN AMERICAN: 58 mL/min — AB (ref >90)
Glucose, Bld: 107 mg/dL — ABNORMAL HIGH (ref 70–99)
Potassium: 5 mEq/L (ref 3.7–5.3)
SODIUM: 140 meq/L (ref 137–147)

## 2014-04-16 LAB — CBC
HCT: 26.1 % — ABNORMAL LOW (ref 39.0–52.0)
Hemoglobin: 7.8 g/dL — ABNORMAL LOW (ref 13.0–17.0)
MCH: 23.8 pg — ABNORMAL LOW (ref 26.0–34.0)
MCHC: 29.9 g/dL — AB (ref 30.0–36.0)
MCV: 79.6 fL (ref 78.0–100.0)
PLATELETS: 464 10*3/uL — AB (ref 150–400)
RBC: 3.28 MIL/uL — ABNORMAL LOW (ref 4.22–5.81)
RDW: 20.2 % — ABNORMAL HIGH (ref 11.5–15.5)
WBC: 20.8 10*3/uL — ABNORMAL HIGH (ref 4.0–10.5)

## 2014-04-16 MED ORDER — ASPIRIN 325 MG PO TABS: 325.0000 mg | ORAL_TABLET | Freq: Every day | ORAL | Status: DC

## 2014-04-16 MED ORDER — HYDROCODONE-ACETAMINOPHEN 5-325 MG PO TABS: 1.0000 | ORAL_TABLET | Freq: Four times a day (QID) | ORAL | Status: DC | PRN

## 2014-04-16 MED ORDER — METOPROLOL TARTRATE 25 MG PO TABS: 25.0000 mg | ORAL_TABLET | Freq: Two times a day (BID) | ORAL | Status: DC

## 2014-04-16 MED ORDER — METOPROLOL TARTRATE 25 MG PO TABS
25.0000 mg | ORAL_TABLET | Freq: Two times a day (BID) | ORAL | Status: DC
  Administered 2014-04-16: 25 mg via ORAL
  Filled 2014-04-16: qty 1

## 2014-04-16 NOTE — Plan of Care (Signed)
Problem: Consults Goal: Chest Pain Patient Education (See Patient Education module for education specifics.) Outcome: Completed/Met Date Met:  04/16/14 Goal: Skin Care Protocol Initiated - if Braden Score 18 or less If consults are not indicated, leave blank or document N/A Outcome: Completed/Met Date Met:  04/16/14 Goal: Tobacco Cessation referral if indicated Outcome: Not Applicable Date Met:  99/77/41 Goal: Nutrition Consult-if indicated Outcome: Not Applicable Date Met:  42/39/53 Goal: Diabetes Guidelines if Diabetic/Glucose > 140 If diabetic or lab glucose is > 140 mg/dl - Initiate Diabetes/Hyperglycemia Guidelines & Document Interventions  Outcome: Not Applicable Date Met:  20/23/34  Problem: Phase I Progression Outcomes Goal: Hemodynamically stable Outcome: Completed/Met Date Met:  04/16/14 Goal: Anginal pain relieved Outcome: Completed/Met Date Met:  04/16/14 Goal: Aspirin unless contraindicated Outcome: Completed/Met Date Met:  04/16/14 Goal: MD aware of Cardiac Marker results Outcome: Completed/Met Date Met:  04/16/14 Goal: Voiding-avoid urinary catheter unless indicated Outcome: Completed/Met Date Met:  04/16/14 Goal: Other Phase I Outcomes/Goals Outcome: Completed/Met Date Met:  04/16/14

## 2014-04-16 NOTE — Progress Notes (Signed)
*  PRELIMINARY RESULTS* Vascular Ultrasound Lower extremity venous duplex has been completed.  Preliminary findings: no evidence of DVT or baker's cyst.  Landry Mellow, RDMS, RVT  04/16/2014, 11:35 AM

## 2014-04-16 NOTE — Consult Note (Signed)
WOC wound consult note Reason for Consult: pt known to this [place], seen last 03/30/14 for similar issues.  Has Stage II Pressure ulcer on the right buttock that has evolved from areas of moisture associated skin damage to partial thickness skin loss on the buttock.  Wound type: Stage II Pressure ulcer  Pressure Ulcer POA: Yes Measurement: 5cm x 6cm x 0.2cm  Wound bed: 75% yellow with 25% pink epithelial buds throughout the wound base.  Drainage (amount, consistency, odor) moderate, serous drainage with no odor Periwound: intact but moist Dressing procedure/placement/frequency: Add calcium alginate as the wound is a bit to exudative for the foam.  Promote moist wound healing while making sure that this wound does not extend due to maceration.   Discussed POC with patient and bedside nurse.  Re consult if needed, will not follow at this time. Thanks  Timo Hartwig Kellogg, [place] [phone])

## 2014-04-16 NOTE — Evaluation (Signed)
Physical Therapy Evaluation Patient Details Name: Kiree Dejarnette MRN: [id number] DOB: [birth year] Today's Date: 04/16/2014   History of Present Illness    73 y.o. male with a history of CAD, Diastolic CHF, HTN, DM2, Myelodysplastic Syndrome who was sent to the ED from the [facility]  due to complaints of right sided chest pain radiating across his chest and under his right arm intermittently for the past 2-3 days. He was recently hospitalized with cellulitis of right leg with sepsis and discharged to SNF.   Clinical Impression  Pt currently with functional limitations due to pain, decreased mobility, decreased endurance and decreased strength. Pt will benefit from skilled PT to increase independence and safety with mobility to allow discharge to SNF. Pt was unwilling to exercise today as he stated he needed to rest and the exercise would not be good for him since he was so tired and weak. Pt able to move all 4 extremities WFL. Pt educated on importance of mobility and how it will decrease the stiffness in his LE and back. Pt stated that he would work with PT tomorrow if he has not discharged to SNF. Pt able to roll onto side with heavy use of railings but would not demonstrate any further mobility at this time.     Follow Up Recommendations SNF    Equipment Recommendations  Rolling walker with 5" wheels    Recommendations for Other Services       Precautions / Restrictions Precautions Precautions: Fall Restrictions Weight Bearing Restrictions: No      Mobility  Bed Mobility Overal bed mobility: Needs Assistance Bed Mobility: Rolling Rolling: Min assist         General bed mobility comments: Pt able to roll to side with heavy use of railing. Pt unable to bring legs all the way over to stack on top of each other in sitting. Pt unwilling to do any other bed mobility besides rolling to Right side.   Transfers  Unable to determine due to pt unwilling to move out of bed  today.                  Ambulation/Gait                Stairs            Wheelchair Mobility    Modified Rankin (Stroke Patients Only)       Balance                                             Pertinent Vitals/Pain Pain Assessment: 0-10 Pain Score: 6  Pain Location: Bilateral LE Pain Intervention(s): Limited activity within patient's tolerance;Monitored during session;Repositioned    Home Living Family/patient expects to be discharged to:: Skilled nursing facility Living Arrangements: Other (Comment) Available Help at Discharge: Family;Available 24 hours/day Type of Home: Apartment Home Access: Level entry     Home Layout: One level Home Equipment: Cane - single point Additional Comments: Pt stated that he lives with his grown son who will be available once discharged from SNF    Prior Function Level of Independence: Independent with assistive device(s)               Hand Dominance   Dominant Hand: Right    Extremity/Trunk Assessment               Lower Extremity  Assessment: Overall WFL for tasks assessed         Communication   Communication: No difficulties  Cognition Arousal/Alertness: Awake/alert Behavior During Therapy: WFL for tasks assessed/performed Overall Cognitive Status: No family/caregiver present to determine baseline cognitive functioning                      General Comments General comments (skin integrity, edema, etc.): Pt educated on the importance of mobility, especially to decrease the stiffness in his LEs and back that he was complaining of. Pt stated he wanted to rest today and a doctor had told him that exercising would be bad for his health. Pt stated he would work with PT tomorrow morning.     Exercises        Assessment/Plan    PT Assessment Patient needs continued PT services  PT Diagnosis Generalized weakness   PT Problem List Decreased strength;Decreased range  of motion;Decreased activity tolerance;Decreased balance;Decreased mobility;Pain  PT Treatment Interventions DME instruction;Gait training;Functional mobility training;Therapeutic activities;Therapeutic exercise;Balance training;Patient/family education   PT Goals (Current goals can be found in the Care Plan section) Acute Rehab PT Goals Patient Stated Goal: Get back to exercising PT Goal Formulation: With patient Time For Goal Achievement: 04/30/14 Potential to Achieve Goals: Fair    Frequency Min 3X/week   Barriers to discharge        Co-evaluation               End of Session   Activity Tolerance: Other (comment) (Pt unwilling to exercise because he wanted to rest) Patient left: in bed;with call Van[place]ieren/phone within reach      Functional Assessment Tool Used: clinical judgment Functional Limitation: Changing and maintaining body position Changing and Maintaining Body Position Current Status (K0881): At least 20 percent but less than 40 percent impaired, limited or restricted Changing and Maintaining Body Position Goal Status (J0315): At least 1 percent but less than 20 percent impaired, limited or restricted    Time: 0958-1013 PT Time Calculation (min) (ACUTE ONLY): 15 min   Charges:   PT Evaluation $Initial PT Evaluation Tier I: 1 Procedure     PT G Codes:   Functional Assessment Tool Used: clinical judgment Functional Limitation: Changing and maintaining body position    Jearld Shines  SPT  04/16/2014, 11:01 AM  Jearld Shines, SPT  Acute Rehabilitation [phone]

## 2014-04-16 NOTE — Progress Notes (Signed)
CSW (Clinical Education officer, museum) prepared pt dc packet and placed with shadow chart. CSW arranged non-emergent ambulance transport. Pt, pt nurse, and facility informed. Pt declined for CSW to call family. CSW signing off.   Devine, Grimes

## 2014-04-16 NOTE — Clinical Social Work Psychosocial (Signed)
     Clinical Social Work Department BRIEF PSYCHOSOCIAL ASSESSMENT 04/16/2014  Patient:  PATRICK, SALEMI     Account Number:  [id number]     Admit date:  04/14/2014  Clinical Social Worker:  Adair Laundry  Date/Time:  04/16/2014 10:00 AM  Referred by:  Physician  Date Referred:  04/16/2014 Referred for  SNF Placement   Other Referral:   Interview type:  Patient Other interview type:    PSYCHOSOCIAL DATA Living Status:  FACILITY Admitted from facility:  [facility] Level of care:  [place] Primary support name:  Katrina Brosh Primary support relationship to patient:  CHILD, ADULT Degree of support available:   Pt has good family support    CURRENT CONCERNS Current Concerns  Post-Acute Placement   Other Concerns:    SOCIAL WORK ASSESSMENT / PLAN CSW notified pt was admitted from facility. CSW visited pt room and spoke with pt. Pt seated in bed and awaiting procedure. Pt reports feeling better today. Pt informed CSW he was admitted from [facility]. Pt reports he has only been at facility for a few days and is there for ST rehab. Pt confirmed that plan is for him to return and his family is aware. Pt informed CSW that his family is aware of dc today and CSW does not need to contact them. Pt is concerned about swelling in legs going down more slowly than he anticipated and limiting therapy participation. Pt did express to CSW that he spoke with MD about concerns. CSW left voicemail for facility notifying that pt is ready to return.   Assessment/plan status:  Psychosocial Support/Ongoing Assessment of Needs Other assessment/ plan:   Information/referral to community resources:   None needed    PATIENTS/FAMILYS RESPONSE TO PLAN OF CARE: Pt agreeable to return to SNF.    Refugio, Roby

## 2014-04-16 NOTE — Discharge Summary (Addendum)
Physician Discharge Summary  Patient ID: Juan [name] MRN: [id number] DOB/AGE: 1940-10-07 73 y.o.  Admit date: 04/14/2014 Discharge date: 04/16/2014  Primary Care Physician:  [name], MD  Discharge Diagnoses:    . Chest pain-atypical   . Coronary atherosclerosis . Chronic diastolic CHF (congestive heart failure) . Essential hypertension . Chronic lower extremity edema, stasis dermatitis . Anemia . Obstructive sleep apnea . Myeloproliferative disease . Leukocytosis Stage II Pressure ulcer on the right buttocks   Consults:None  Recommendations for Outpatient Follow-up:  Please check CBC, BMET at the follow-up appointment  Wound care Cut to fit a piece of calcium alginate and place on the right buttock wound, cover with silicone foam. Change every 3 days and PRN soilage.   Allergies:  No Known Allergies   Discharge Medications:   Medication List    TAKE these medications        acetaminophen 500 MG tablet  Commonly known as:  TYLENOL  Take 500-1,000 mg by mouth every 6 (six) hours as needed for moderate pain or headache.     aspirin 325 MG tablet  Take 1 tablet (325 mg total) by mouth daily.     b complex vitamins capsule  Take 1 capsule by mouth daily.     feeding supplement (GLUCERNA SHAKE) Liqd  Take 237 mLs by mouth 2 (two) times daily between meals.     Ferrous Sulfate Dried 200 (65 FE) MG Tabs  Take 1 tablet by mouth daily.     fluticasone 50 MCG/ACT nasal spray  Commonly known as:  FLONASE  Place 2 sprays into both nostrils daily.     folic acid 1 MG tablet  Commonly known as:  FOLVITE  Take 1 mg by mouth daily.     furosemide 40 MG tablet  Commonly known as:  LASIX  Take 1 tablet (40 mg total) by mouth 2 (two) times daily.     gabapentin 100 MG capsule  Commonly known as:  NEURONTIN  Take 1 capsule (100 mg total) by mouth at bedtime.     hydrocerin Crea  Apply Eucerin cream to BLE Q day after bathing and roughly towel drying to remove  loose skin     HYDROcodone-acetaminophen 5-325 MG per tablet  Commonly known as:  NORCO/VICODIN  Take 1 tablet by mouth every 6 (six) hours as needed for moderate pain.     metoprolol tartrate 25 MG tablet  Commonly known as:  LOPRESSOR  Take 1 tablet (25 mg total) by mouth 2 (two) times daily.     pantoprazole 40 MG tablet  Commonly known as:  PROTONIX  Take 1 tablet (40 mg total) by mouth daily.     potassium chloride SA 20 MEQ tablet  Commonly known as:  K-DUR,KLOR-CON  Take 20 mEq by mouth daily.     sodium chloride 0.65 % Soln nasal spray  Commonly known as:  OCEAN  Place 1-2 sprays into both nostrils every 4 (four) hours as needed for congestion.     zolpidem 5 MG tablet  Commonly known as:  AMBIEN  Take 1 tablet (5 mg total) by mouth at bedtime.         Brief H and P: For complete details please refer to admission H and P, but in brief the patient is a 73 year old male with CAD, diastolic CHF, hypertension, diabetes type 2, myelodysplastic syndrome who was sent to the ED from the [facility] skilled [facility] facility due to complaints of right-sided chest pain, radiating across his  chest and under his right arm intermittently for the past 2-3 days prior to admission. He was recently hospitalized with cellulitis of the right leg with sepsis and discharged to SNF. Patient underwent CT angiogram of the chest which was negative for PE   Hospital Course:  Atypical chest pain possibly more musculoskeletal and localized to the right rib area. Patient had no further chest pain during hospitalization. Serial cardiac enzymes were done and was negative for acute ACS. Patient was monitored on telemetry and was negative for any arrhythmias. D-dimer was elevated and CT angiogram was pursued which was negative for pulmonary embolism. Doppler ultrasound of the lower extremities were negative for DVT. Patient was given pain medications which did improve the chest pain.  CKD stage  III Stable, patient had ATN during recent hospitalization, continue Lasix at home dose  History of CAD, chronic diastolic CHF Stable, currently euvolemic, continue Lasix at outpatient dose, has chronic lower extending the swelling. Recent 2-D echo was unremarkable, continue aspirin  Ascending aortic aneurysm Incidental finding of 4.3 cm ascending aortic aneurysm, Dr. Clementeen Graham spoke with Dr. 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 As per oncology consult note from 2 weeks back (Dr [name]), patient was evaluated in July 2015 by her and Dr. [name] at [facility]. Negative for Addison mutation in May 2015. At that time, acute leukemia or high-grade MDS was a suspected, based on peripheral blood smear and increased blasts. Patient refused bone marrow biopsy on repeated requests, oncology signed off and recommended no further workup at this time.   Protein calorie malnutrition Continue nutritional supplements  Stage II right buttock ulcer Wound care consult was placed and recommended adding calcium alginate as the wound is exudative for the foam. Patient will need wound care and dressing change every 3 days and PRN.   Day of Discharge BP 127/80 mmHg  Pulse 113  Temp(Src) 98.1 F (36.7 C) (Oral)  Resp 21  Ht _0  (1.956 m)  Wt 103.874 kg (229 lb)  BMI 27.15 kg/m2  SpO2 98%  Physical Exam: General: Alert and awake oriented x3 not in any acute distress. CVS: S1-S2 clear no murmur rubs or gallops Chest: clear to auscultation bilaterally, no wheezing rales or rhonchi Abdomen: soft nontender, nondistended, normal bowel sounds Extremities: no cyanosis, clubbing or + edema right worse than left with chronic stasis dermatitis  Neuro: Cranial nerves II-XII intact, no focal neurological deficits   The  results of significant diagnostics from this hospitalization (including imaging, microbiology, ancillary and laboratory) are listed below for reference.    LAB RESULTS: Basic Metabolic Panel:  Recent Labs Lab 04/15/14 0406 04/16/14 0804  NA 139 140  K 5.0 5.0  CL 103 105  CO2 23 23  GLUCOSE 93 107*  BUN 22 19  CREATININE 1.27 1.21  CALCIUM 9.0 9.0   Liver Function Tests: No results for input(s): AST, ALT, ALKPHOS, BILITOT, PROT, ALBUMIN in the last 168 hours. No results for input(s): LIPASE, AMYLASE in the last 168 hours. No results for input(s): AMMONIA in the last 168 hours. CBC:  Recent Labs Lab 04/10/14 0525  04/15/14 0406 04/16/14 0804  WBC 20.8*  < > 19.2* 20.8*  NEUTROABS 13.5*  --   --   --   HGB 7.6*  < > 7.8* 7.8*  HCT 24.9*  < > 25.5* 26.1*  MCV 80.3  < > 81.0 79.6  PLT 382  < > 409* 464*  < > = values in this interval not displayed. Cardiac Enzymes:  Recent Labs Lab 04/15/14 0910 04/15/14 1540  TROPONINI <0.30 <0.30   BNP: Invalid input(s): POCBNP CBG:  Recent Labs Lab 04/09/14 2148 04/10/14 0628  GLUCAP 110* 112*    Significant Diagnostic Studies:  Ct Angio Chest Pe W/cm &/or Wo Cm  04/15/2014   CLINICAL DATA:  73 year old with right chest pain and tachycardia. Cellulitis.  EXAM: CT ANGIOGRAPHY CHEST WITH CONTRAST  TECHNIQUE: Multidetector CT imaging of the chest was performed using the standard protocol during bolus administration of intravenous contrast. Multiplanar CT image reconstructions and MIPs were obtained to evaluate the vascular anatomy.  CONTRAST:  15m OMNIPAQUE IOHEXOL 350 MG/ML SOLN  COMPARISON:  Chest radiograph 04/15/2014  FINDINGS: No evidence for a pulmonary embolism. The main pulmonary artery is enlarged, measuring 4.9 cm. Ascending thoracic aorta is enlarged measuring 4.3 cm. Descending thoracic aorta measures up to 3.6 cm. Right hemidiaphragm is elevated.  Small amount of fluid in pericardial recess. There are small lymph  nodes in the mediastinum. Overall, no significant chest lymphadenopathy. Spleen appears to be enlarged measuring 15.9 cm in the AP dimension. Difficult to exclude perisplenic fluid.  The trachea and mainstem bronchi are patent. There is volume loss in the right lower lobe with a large linear density presumed to represent subsegmental atelectasis. Scattered areas of ground-glass opacities throughout both lungs could represent edema versus air trapping. No large areas of consolidation or airspace disease. Diffuse sclerosis of the bones with small scattered areas of bone lucency.  Review of the MIP images confirms the above findings.  IMPRESSION: Negative for pulmonary embolism.  Enlarged pulmonary arteries suggestive for pulmonary hypertension.  Splenomegaly.  Scattered areas of ground-glass throughout both lungs. Differential would include patchy edema versus areas of air trapping.  Elevated right hemidiaphragm.  Diffuse sclerosis throughout the bones. Reportedly, patient has a history of myoproliferative disease and this could account for this finding. Prostate cancer could also have this appearance and recommend clinical correlation.  Ascending thoracic aorta measures up to 4.3 cm consistent with an ascending thoracic aortic aneurysm. Recommend surveillance imaging followup by CTA or MRA and referral to cardiothoracic surgery if not already obtained. This recommendation follows 2010 ACCF/AHA/AATS/ACR/ASA/SCA/SCAI/SIR/STS/SVM Guidelines for the Diagnosis and Management of Patients With Thoracic Aortic Disease.  Circulation. 2010; 121:: [id number]  Electronically Signed   By: AMarkus DaftM.D.   On: 04/15/2014 09:31   Dg Chest Port 1 View  04/15/2014   CLINICAL DATA:  73year old male with chest pain  EXAM: PORTABLE CHEST - 1 VIEW  COMPARISON:  Prior chest x-ray 03/31/2014  FINDINGS: Stable cardiomegaly with left heart prominence. Atherosclerotic calcification present in the transverse aorta. Low inspiratory volumes  with bibasilar opacities which are similar is slightly improved compared to prior. Diffuse central airway thickening and peribronchial cuffing with scattered perihilar atelectasis. No pneumothorax, pleural effusion or new airspace consolidation. No acute osseous abnormality.  IMPRESSION: Slightly improved aeration compared to 03/31/2014.  Persistent very low inspiratory volumes with bibasilar atelectasis.  Cardiomegaly with pulmonary vascular congestion but no overt edema.   Electronically Signed   By: HJacqulynn CadetM.D.   On: 04/15/2014 00:30       Disposition and Follow-up:    DISPOSITION: Skilled nursing facility  DIET: Heart healthy diet    DISCHARGE FOLLOW-UP Follow-up Information    Follow up with JKathlene November MD. Schedule an  appointment as soon as possible for a visit in 2 weeks.   Specialty:  Internal Medicine   Why:  for hospital follow-up   Contact information:   [address] [place] [address] [phone]       Follow up with HENDRICKSON,STEVEN C, MD. Schedule an appointment as soon as possible for a visit in 6 months.   Specialty:  Cardiothoracic Surgery   Why:  for hospital follow-up   Contact information:   [address] [place] [address] [phone]       Time spent on Discharge: 40 mins  Signed:   Aprill Banko M.D. [facility] 04/16/2014, 11:59 AM Pager: [phone]

## 2014-04-17 ENCOUNTER — Other Ambulatory Visit: Payer: Medicare Other

## 2014-04-17 ENCOUNTER — Ambulatory Visit: Payer: Medicare Other | Admitting: Hematology and Oncology

## 2014-04-19 ENCOUNTER — Non-Acute Institutional Stay (SKILLED_NURSING_FACILITY): Payer: Medicare Other | Admitting: Internal Medicine

## 2014-04-19 DIAGNOSIS — R079 Chest pain, unspecified: Secondary | ICD-10-CM | POA: Diagnosis not present

## 2014-04-19 DIAGNOSIS — I714 Abdominal aortic aneurysm, without rupture, unspecified: Secondary | ICD-10-CM

## 2014-04-19 DIAGNOSIS — N183 Chronic kidney disease, stage 3 unspecified: Secondary | ICD-10-CM

## 2014-04-19 DIAGNOSIS — I25118 Atherosclerotic heart disease of native coronary artery with other forms of angina pectoris: Secondary | ICD-10-CM

## 2014-04-19 DIAGNOSIS — I5032 Chronic diastolic (congestive) heart failure: Secondary | ICD-10-CM

## 2014-04-19 DIAGNOSIS — G629 Polyneuropathy, unspecified: Secondary | ICD-10-CM | POA: Diagnosis not present

## 2014-04-19 NOTE — Progress Notes (Signed)
MRN: [id number] Name: Juan [name]  Sex: male Age: 73 y.o. DOB: Oct 27, 1940  [place] #: [facility] Facility/Room:311 Level Of Care: SNF Provider: Inocencio Homes D Emergency Contacts: Extended Emergency Contact Information Primary Emergency Contact: Vanderberg,Carolyn Address: [place], [place] Mobile Phone: [phone] Relation: Daughter    Allergies: Review of patient's allergies indicates no known allergies.  Chief Complaint  Patient presents with  . New Admit To SNF    HPI: Patient is 73 y.o. male who is admitted to SNF after hospitalization  For atypical CP with neg w/u.  Past Medical History  Diagnosis Date  . CAD (coronary artery disease)     dx elsewheer in past, no documentation. Non-ischemic myovue 2007  . CHF (congestive heart failure)     dx elsewhere, no documentation. Normal EF by previous echo. Trival AS needs f/u ECHO by 2012  . Hypertension   . Sleep apnea, obstructive     at some point used CPAP, was d/c  years ago  . Anemia   . History of thrombocytosis   . Allergic rhinitis   . Edema     R>L leg, u/s 5-12 neg for DVT  . Hemorrhoid   . Type II diabetes mellitus   . Sinus congestion   . Migraine     "once/wk at least" (07/11/2013)  . Shortness of breath     Past Surgical History  Procedure Laterality Date  . Toe surgery Right     "tried to straighten out big toe" (07/11/2013)      Medication List       This list is accurate as of: 04/19/14 11:59 PM.  Always use your most recent med list.               acetaminophen 500 MG tablet  Commonly known as:  TYLENOL  Take 500-1,000 mg by mouth every 6 (six) hours as needed for moderate pain or headache.     aspirin 325 MG tablet  Take 1 tablet (325 mg total) by mouth daily.     b complex vitamins capsule  Take 1 capsule by mouth daily.     feeding supplement (GLUCERNA SHAKE) Liqd  Take 237 mLs by mouth 2 (two) times daily between meals.     Ferrous  Sulfate Dried 200 (65 FE) MG Tabs  Take 1 tablet by mouth daily.     fluticasone 50 MCG/ACT nasal spray  Commonly known as:  FLONASE  Place 2 sprays into both nostrils daily.     folic acid 1 MG tablet  Commonly known as:  FOLVITE  Take 1 mg by mouth daily.     furosemide 40 MG tablet  Commonly known as:  LASIX  Take 1 tablet (40 mg total) by mouth 2 (two) times daily.     gabapentin 100 MG capsule  Commonly known as:  NEURONTIN  Take 1 capsule (100 mg total) by mouth at bedtime.     hydrocerin Crea  Apply Eucerin cream to BLE Q day after bathing and roughly towel drying to remove loose skin     HYDROcodone-acetaminophen 5-325 MG per tablet  Commonly known as:  NORCO/VICODIN  Take 1 tablet by mouth every 6 (six) hours as needed for moderate pain.     metoprolol tartrate 25 MG tablet  Commonly known as:  LOPRESSOR  Take 1 tablet (25 mg total) by mouth 2 (two) times daily.     pantoprazole  40 MG tablet  Commonly known as:  PROTONIX  Take 1 tablet (40 mg total) by mouth daily.     potassium chloride SA 20 MEQ tablet  Commonly known as:  K-DUR,KLOR-CON  Take 20 mEq by mouth daily.     sodium chloride 0.65 % Soln nasal spray  Commonly known as:  OCEAN  Place 1-2 sprays into both nostrils every 4 (four) hours as needed for congestion.     zolpidem 5 MG tablet  Commonly known as:  AMBIEN  Take 1 tablet (5 mg total) by mouth at bedtime.        No orders of the defined types were placed in this encounter.    Immunization History  Administered Date(s) Administered  . Influenza Split 02/25/2011  . Influenza Whole 03/17/2007, 04/25/2009  . Influenza, High Dose Seasonal PF 04/12/2013  . Influenza, Seasonal, Injecte, Preservative Fre 05/05/2012  . Influenza,inj,Quad PF,36+ Mos 01/26/2014  . Pneumococcal Polysaccharide-23 01/14/2012, 03/31/2014  . Td 08/29/2009    History  Substance Use Topics  . Smoking status: Former Smoker -- 0.25 packs/day for 12 years    Types:  Cigarettes    Quit date: 05/18/1966  . Smokeless tobacco: Never Used  . Alcohol Use: No    Family history is noncontributory    Review of Systems  DATA OBTAINED: from patient GENERAL:  no fevers, fatigue, appetite changes SKIN: No itching, rash or wounds EYES: No eye pain, redness, discharge EARS: No earache, tinnitus, change in hearing NOSE: No congestion, drainage or bleeding  MOUTH/THROAT: No mouth or tooth pain, No sore throat RESPIRATORY: No cough, wheezing, SOB CARDIAC: No chest pain, palpitations, lower extremity edema  GI: No abdominal pain, No N/V/D or constipation, No heartburn or reflux  GU: No dysuria, frequency or urgency, or incontinence  MUSCULOSKELETAL: No unrelieved bone/joint pain; pain/soreness/numbness bottom of feet NEUROLOGIC: No headache, dizziness or focal weakness PSYCHIATRIC: No overt anxiety or sadness, No behavior issue.   Filed Vitals:   04/19/14 2050  BP: 113/75  Pulse: 117  Temp: 98 F (36.7 C)  Resp: 20    Physical Exam  GENERAL APPEARANCE: Alert, conversant,  No acute distress;BM with multiple bottles of Mt. Dew in his room.  SKIN: No diaphoresis rash HEAD: Normocephalic, atraumatic  EYES: Conjunctiva/lids clear. Pupils round, reactive. EOMs intact.  EARS: External exam WNL, canals clear. Hearing grossly normal.  NOSE: No deformity or discharge.  MOUTH/THROAT: Lips w/o lesions  RESPIRATORY: Breathing is even, unlabored. Lung sounds are clear   CARDIOVASCULAR: Heart RRR no murmurs, rubs or gallops. No peripheral edema.   GASTROINTESTINAL: Abdomen is soft, non-tender, not distended w/ normal bowel sounds. GENITOURINARY: Bladder non tender, not distended  MUSCULOSKELETAL: No abnormal joints or musculature NEUROLOGIC:  Cranial nerves 2-12 grossly intact. Moves all extremities  PSYCHIATRIC: Mood and affect appropriate to situation, no behavioral issues  Patient Active Problem List   Diagnosis Date Noted  . Abdominal aortic aneurysm  04/26/2014  . CKD (chronic kidney disease) stage 3, GFR 30-59 ml/min 04/26/2014  . Neuropathy 04/26/2014  . Chest pain 04/15/2014  . Type II diabetes mellitus 04/15/2014  . Cellulitis of right lower leg   . Venous stasis dermatitis of both lower extremities   . Malnutrition of moderate degree 03/31/2014  . Sepsis 03/29/2014  . Morbid obesity due to excess calories 03/29/2014  . Leukocytosis 10/17/2013  . Cellulitis 07/11/2013  . Cellulitis and abscess of leg 07/11/2013  . Poor compliance with advise  05/05/2012  . Annual physical exam 01/14/2012  .  Weight loss 01/14/2012  . BPH (benign prostatic hyperplasia) 01/14/2012  . Chronic lower extremity edema, stasis dermatitis 06/09/2011  . CARDIOMEGALY 04/29/2009  . Prediabetes 09/07/2006  . Anemia 09/07/2006  . Myeloproliferative disease 09/07/2006  . Obstructive sleep apnea 09/07/2006  . Essential hypertension 09/07/2006  . Coronary atherosclerosis 09/07/2006  . Chronic diastolic CHF (congestive heart failure) 09/07/2006  . ALLERGIC RHINITIS 09/07/2006    CBC    Component Value Date/Time   WBC 20.8* 04/16/2014 0804   WBC 18.4* 11/16/2013 0847   RBC 3.28* 04/16/2014 0804   RBC 3.82* 11/16/2013 0847   HGB 7.8* 04/16/2014 0804   HGB 9.8* 11/16/2013 0847   HCT 26.1* 04/16/2014 0804   HCT 30.7* 11/16/2013 0847   PLT 464* 04/16/2014 0804   PLT 323 11/16/2013 0847   MCV 79.6 04/16/2014 0804   MCV 80.3 11/16/2013 0847   LYMPHSABS 4.8* 04/10/2014 0525   MONOABS 0.8 04/10/2014 0525   EOSABS 0.6 04/10/2014 0525   BASOSABS 0.2* 04/10/2014 0525    CMP     Component Value Date/Time   NA 140 04/16/2014 0804   NA 138 11/16/2013 0847   K 5.0 04/16/2014 0804   K 4.5 11/16/2013 0847   CL 105 04/16/2014 0804   CO2 23 04/16/2014 0804   CO2 22 11/16/2013 0847   GLUCOSE 107* 04/16/2014 0804   GLUCOSE 126 11/16/2013 0847   BUN 19 04/16/2014 0804   BUN 20.2 11/16/2013 0847   CREATININE 1.21 04/16/2014 0804   CREATININE 1.2  11/16/2013 0847   CALCIUM 9.0 04/16/2014 0804   CALCIUM 9.8 11/16/2013 0847   PROT 6.0 04/02/2014 0226   PROT 6.7 11/16/2013 0847   ALBUMIN 1.9* 04/02/2014 0226   ALBUMIN 3.4* 11/16/2013 0847   AST 18 04/02/2014 0226   AST 25 11/16/2013 0847   ALT 12 04/02/2014 0226   ALT 11 11/16/2013 0847   ALKPHOS 136* 04/02/2014 0226   ALKPHOS 125 11/16/2013 0847   BILITOT 0.5 04/02/2014 0226   BILITOT 0.35 11/16/2013 0847   GFRNONAA 58* 04/16/2014 0804   GFRAA 67* 04/16/2014 0804    Assessment and Plan  Chest pain possibly more musculoskeletal and localized to the right rib area. Patient had no further chest pain during hospitalization. Serial cardiac enzymes were done and was negative for acute ACS. Patient was monitored on telemetry and was negative for any arrhythmias. D-dimer was elevated and CT angiogram was pursued which was negative for pulmonary embolism. Doppler ultrasound of the lower extremities were negative for DVT. Patient was given pain medications which did improve the chest pain  Chronic diastolic CHF (congestive heart failure) Stable, currently euvolemic, continue Lasix at outpatient dose, has chronic lower extending the swelling. Recent 2-D echo was unremarkable, continue aspirin   Coronary atherosclerosis Stable, currently euvolemic, continue Lasix at outpatient dose, has chronic lower extending the swelling. Recent 2-D echo was unremarkable, continue aspirin   Abdominal aortic aneurysm Incidental finding of 4.3 cm ascending aortic aneurysm, Dr. Clementeen Graham spoke with Dr. Roxan Hockey (CT surgery) and who recommended follow-up in 6 months with repeat imaging  CKD (chronic kidney disease) stage 3, GFR 30-59 ml/min Stable, patient had ATN during recent hospitalization, continue Lasix at home dose  Neuropathy Cr[facility] won't allow anything but topical voltarin gel    Hennie Duos, MD

## 2014-04-24 ENCOUNTER — Non-Acute Institutional Stay (SKILLED_NURSING_FACILITY): Payer: Medicare Other | Admitting: Nurse Practitioner

## 2014-04-24 DIAGNOSIS — I5032 Chronic diastolic (congestive) heart failure: Secondary | ICD-10-CM | POA: Diagnosis not present

## 2014-04-24 DIAGNOSIS — R609 Edema, unspecified: Secondary | ICD-10-CM | POA: Diagnosis not present

## 2014-04-24 DIAGNOSIS — E875 Hyperkalemia: Secondary | ICD-10-CM

## 2014-04-24 DIAGNOSIS — D471 Chronic myeloproliferative disease: Secondary | ICD-10-CM

## 2014-04-24 NOTE — Progress Notes (Signed)
Patient ID: Juan [name], male   DOB: 1940/10/05, 73 y.o.   MRN: [id number]    Nursing Home Location:  [place] of Service: SNF (31)  PCP: [name], MD  No Known Allergies  Chief Complaint  Patient presents with  . Acute Visit    HPI:  Patient is a 73 y.o. male seen today at [facility] at the request of nursing. Pt with a pmh of  CAD, diastolic CHF, hypertension, diabetes type 2, myelodysplastic syndrome. Pt here at [facility] after hospitalization due to chest pain for ongoing rehab. Pt reported he was told not to walk due to his swelling in his leg and therefore he is not participating in therapy with anything that involves walking. Reports the floors are also too hard. Reports swelling has gotten better but is not gone. Currently on lasix 40 mg BID with 20 meq of potassium.  Reports he can not wear TEDs or compression hose.  Pt currently in his room in bed eating potato chips.  Denies shortness of breath or chest pains  Review of Systems:  Review of Systems  Constitutional: Negative for activity change, appetite change, fatigue and unexpected weight change.  HENT: Negative for congestion and hearing loss.   Eyes: Negative.   Respiratory: Negative for cough and shortness of breath.   Cardiovascular: Positive for leg swelling. Negative for chest pain and palpitations.  Gastrointestinal: Negative for abdominal pain, diarrhea and constipation.  Musculoskeletal: Negative for myalgias and arthralgias.  Skin: Negative for color change and wound.  Neurological: Positive for weakness. Negative for dizziness.  Psychiatric/Behavioral: Positive for confusion. Negative for behavioral problems and agitation.    Past Medical History  Diagnosis Date  . CAD (coronary artery disease)     dx elsewheer in past, no documentation. Non-ischemic myovue 2007  . CHF (congestive heart failure)     dx elsewhere, no documentation. Normal EF by previous echo. Trival  AS needs f/u ECHO by 2012  . Hypertension   . Sleep apnea, obstructive     at some point used CPAP, was d/c  years ago  . Anemia   . History of thrombocytosis   . Allergic rhinitis   . Edema     R>L leg, u/s 5-12 neg for DVT  . Hemorrhoid   . Type II diabetes mellitus   . Sinus congestion   . Migraine     "once/wk at least" (07/11/2013)  . Shortness of breath    Past Surgical History  Procedure Laterality Date  . Toe surgery Right     "tried to straighten out big toe" (07/11/2013)   Social History:   reports that he quit smoking about 47 years ago. His smoking use included Cigarettes. He has a 3 pack-year smoking history. He has never used smokeless tobacco. He reports that he does not drink alcohol or use illicit drugs.  Family History  Problem Relation Age of Onset  . Colon cancer Neg Hx   . Prostate cancer Neg Hx   . Heart attack Neg Hx   . Diabetes Neg Hx   . Schizophrenia Son     Medications: Patient's Medications  New Prescriptions   No medications on file  Previous Medications   ACETAMINOPHEN (TYLENOL) 500 MG TABLET    Take 500-1,000 mg by mouth every 6 (six) hours as needed for moderate pain or headache.    ASPIRIN 325 MG TABLET    Take 1 tablet (325 mg total) by mouth  daily.   B COMPLEX VITAMINS CAPSULE    Take 1 capsule by mouth daily.   FEEDING SUPPLEMENT, GLUCERNA SHAKE, (GLUCERNA SHAKE) LIQD    Take 237 mLs by mouth 2 (two) times daily between meals.   FERROUS SULFATE DRIED 200 (65 FE) MG TABS    Take 1 tablet by mouth daily.    FLUTICASONE (FLONASE) 50 MCG/ACT NASAL SPRAY    Place 2 sprays into both nostrils daily.    FOLIC ACID (FOLVITE) 1 MG TABLET    Take 1 mg by mouth daily.    FUROSEMIDE (LASIX) 40 MG TABLET    Take 1 tablet (40 mg total) by mouth 2 (two) times daily.   GABAPENTIN (NEURONTIN) 100 MG CAPSULE    Take 1 capsule (100 mg total) by mouth at bedtime.   HYDROCERIN (EUCERIN) CREA    Apply Eucerin cream to BLE Q day after bathing and roughly  towel drying to remove loose skin   HYDROCODONE-ACETAMINOPHEN (NORCO/VICODIN) 5-325 MG PER TABLET    Take 1 tablet by mouth every 6 (six) hours as needed for moderate pain.   METOPROLOL TARTRATE (LOPRESSOR) 25 MG TABLET    Take 1 tablet (25 mg total) by mouth 2 (two) times daily.   PANTOPRAZOLE (PROTONIX) 40 MG TABLET    Take 1 tablet (40 mg total) by mouth daily.   POTASSIUM CHLORIDE SA (K-DUR,KLOR-CON) 20 MEQ TABLET    Take 20 mEq by mouth daily.   SODIUM CHLORIDE (OCEAN) 0.65 % SOLN NASAL SPRAY    Place 1-2 sprays into both nostrils every 4 (four) hours as needed for congestion.   ZOLPIDEM (AMBIEN) 5 MG TABLET    Take 1 tablet (5 mg total) by mouth at bedtime.  Modified Medications   No medications on file  Discontinued Medications   No medications on file     Physical Exam: There were no vitals filed for this visit.  Physical Exam  Constitutional: He is oriented to person, place, and time. He appears well-developed and well-nourished. No distress.  HENT:  Head: Normocephalic and atraumatic.  Mouth/Throat: Oropharynx is clear and moist. No oropharyngeal exudate.  Eyes: Conjunctivae and EOM are normal. Pupils are equal, round, and reactive to light.  Neck: Normal range of motion. Neck supple.  Cardiovascular: Normal rate, regular rhythm and normal heart sounds.   Pulmonary/Chest: Effort normal and breath sounds normal.  Abdominal: Soft. Bowel sounds are normal.  Musculoskeletal: He exhibits edema (trace edema bilaterally). He exhibits no tenderness.  Neurological: He is alert and oriented to person, place, and time.  Skin: Skin is warm and dry. He is not diaphoretic.  Psychiatric: His mood appears anxious. He expresses inappropriate judgment.    Labs reviewed: Basic Metabolic Panel:  Recent Labs  04/07/14 0545 04/14/14 2340 04/15/14 0406 04/16/14 0804  NA 140 137 139 140  K 4.9 5.0 5.0 5.0  CL 105 105 103 105  CO2 23  --  23 23  GLUCOSE 104* 103* 93 107*  BUN 35* 23  22 19   CREATININE 1.98* 1.40* 1.27 1.21  CALCIUM 8.8  --  9.0 9.0   Liver Function Tests:  Recent Labs  03/31/14 0350 04/01/14 0230 04/02/14 0226  AST 18 22 18   ALT 12 12 12   ALKPHOS 101 122* 136*  BILITOT 0.5 0.5 0.5  PROT 5.8* 6.1 6.0  ALBUMIN 2.2* 2.0* 1.9*   No results for input(s): LIPASE, AMYLASE in the last 8760 hours. No results for input(s): AMMONIA in the last 8760 hours.  CBC:  Recent Labs  04/08/14 0431 04/09/14 0640 04/10/14 0525 04/14/14 2327 04/14/14 2340 04/15/14 0406 04/16/14 0804  WBC 24.6* 24.2* 20.8* 21.2*  --  19.2* 20.8*  NEUTROABS 17.2* 16.7* 13.5*  --   --   --   --   HGB 7.8* 8.0* 7.6* 7.8* 8.5* 7.8* 7.8*  HCT 25.3* 26.9* 24.9* 25.3* 25.0* 25.5* 26.1*  MCV 81.1 81.3 80.3 80.8  --  81.0 79.6  PLT 389 390 382 PLATELET CLUMPS NOTED ON SMEAR, COUNT APPEARS ADEQUATE  --  409* 464*   TSH: No results for input(s): TSH in the last 8760 hours. A1C: Lab Results  Component Value Date   HGBA1C 6.0 10/19/2013   Lipid Panel: No results for input(s): CHOL, HDL, LDLCALC, TRIG, CHOLHDL, LDLDIRECT in the last 8760 hours.  CBC NO Diff (Complete Blood Count)    Result: 04/23/2014 3:42 PM   ( Status: F )       WBC 17.5   H 4.0-10.5 K/uL SLN   RBC 3.28   L 4.22-5.81 MIL/uL SLN   Hemoglobin 7.9   L 13.0-17.0 g/dL SLN   Hematocrit 25.1   L 39.0-52.0 % SLN   MCV 76.5   L 78.0-100.0 fL SLN   MCH 24.1   L 26.0-34.0 pg SLN   MCHC 31.5     30.0-36.0 g/dL SLN   RDW 20.1   H 11.5-15.5 % SLN   Platelet Count 621   H 150-400 K/uL SLN   MPV 9.8     9.4-12.4 fL SLN   Basic Metabolic Panel    Result: 04/23/2014 3:37 PM   ( Status: F )       Sodium 135     135-145 mEq/L SLN   Potassium 5.4   H 3.5-5.3 mEq/L SLN C Chloride 103     96-112 mEq/L SLN   CO2 25     19-32 mEq/L SLN   Glucose 102   H 70-99 mg/dL SLN   BUN 25   H 6-23 mg/dL SLN   Creatinine 1.37   H 0.50-1.35 mg/dL SLN   Calcium 9.3     8.4-10.5 mg/dL SLN   Prealbumin    Result: 04/23/2014 5:17 PM   (  Status: F )       Prealbumin 15.8   L 17.0-34.0 mg/dL SLN   -- END OF REPORT --  Assessment/Plan 1. Chronic diastolic CHF (congestive heart failure) -remains stable, will cont lasix   2. Edema Has improved significantly, on lasix for CHF. Education provided regarding edema and how he is safe to walk at this time. Discussed low sodium diet Discuss need to elevate legs when not in therapy but okay to walk and use LE during therapy.  Pt reports he is going to choose not to at this time but may restart next week. Provided education that he is at [facility] for rehab and strength training  3. Hyperkalemia Labs reviewed Potassium of 5.4, will have staff hold potassium for 3 days and recheck lab  4. Myeloproliferative disease Labs are stable at this time

## 2014-04-25 DIAGNOSIS — L89213 Pressure ulcer of right hip, stage 3: Secondary | ICD-10-CM | POA: Diagnosis not present

## 2014-04-26 ENCOUNTER — Encounter: Payer: Self-pay | Admitting: Internal Medicine

## 2014-04-26 DIAGNOSIS — N183 Chronic kidney disease, stage 3 unspecified: Secondary | ICD-10-CM | POA: Insufficient documentation

## 2014-04-26 DIAGNOSIS — I712 Thoracic aortic aneurysm, without rupture, unspecified: Secondary | ICD-10-CM | POA: Insufficient documentation

## 2014-04-26 NOTE — Assessment & Plan Note (Signed)
Incidental finding of 4.3 cm ascending aortic aneurysm, Dr. Clementeen Graham spoke with Dr. Roxan Hockey (CT surgery) and who recommended follow-up in 6 months with repeat imaging

## 2014-04-26 NOTE — Assessment & Plan Note (Signed)
possibly more musculoskeletal and localized to the right rib area. Patient had no further chest pain during hospitalization. Serial cardiac enzymes were done and was negative for acute ACS. Patient was monitored on telemetry and was negative for any arrhythmias. D-dimer was elevated and CT angiogram was pursued which was negative for pulmonary embolism. Doppler ultrasound of the lower extremities were negative for DVT. Patient was given pain medications which did improve the chest pain

## 2014-04-26 NOTE — Assessment & Plan Note (Signed)
Stable, currently euvolemic, continue Lasix at outpatient dose, has chronic lower extending the swelling. Recent 2-D echo was unremarkable, continue aspirin

## 2014-04-26 NOTE — Assessment & Plan Note (Signed)
Stable, patient had ATN during recent hospitalization, continue Lasix at home dose

## 2014-04-26 NOTE — Assessment & Plan Note (Signed)
Cr[facility] won't allow anything but topical voltarin gel

## 2014-05-02 DIAGNOSIS — L89213 Pressure ulcer of right hip, stage 3: Secondary | ICD-10-CM | POA: Diagnosis not present

## 2014-05-09 DIAGNOSIS — L89213 Pressure ulcer of right hip, stage 3: Secondary | ICD-10-CM | POA: Diagnosis not present

## 2014-05-14 ENCOUNTER — Other Ambulatory Visit: Payer: Self-pay | Admitting: *Deleted

## 2014-05-14 MED ORDER — ZOLPIDEM TARTRATE 5 MG PO TABS: 5.0000 mg | ORAL_TABLET | Freq: Every day | ORAL | Status: DC

## 2014-05-16 DIAGNOSIS — L89213 Pressure ulcer of right hip, stage 3: Secondary | ICD-10-CM | POA: Diagnosis not present

## 2014-05-23 DIAGNOSIS — L89213 Pressure ulcer of right hip, stage 3: Secondary | ICD-10-CM | POA: Diagnosis not present

## 2014-05-24 ENCOUNTER — Encounter: Payer: Self-pay | Admitting: Internal Medicine

## 2014-05-24 NOTE — Progress Notes (Signed)
This encounter was created in error - please disregard.

## 2014-05-30 DIAGNOSIS — L89213 Pressure ulcer of right hip, stage 3: Secondary | ICD-10-CM | POA: Diagnosis not present

## 2014-06-06 DIAGNOSIS — L89213 Pressure ulcer of right hip, stage 3: Secondary | ICD-10-CM | POA: Diagnosis not present

## 2014-06-08 NOTE — Consult Note (Signed)
Patient EA:OKCNV Ambler      DOB: 05-29-1940      PBZ:[id number]     Consult Note from the Palliative Medicine Team at [facility]    Consult Requested by: Dr. Susie Cassette     PCP: Willow Ora, MD Reason for Consultation: GOC     Phone [phone]  Assessment of patients Current state: I spoke today with Mr. Burkhead and his son and daughter at bedside. We spoke vaguely about his situation with possible leukemia as he is resistant to share information with his family. However, Mr. Lerette does not relay information correctly as he speaks with me according to what has been discussed with him. His memory also seems impaired. Family tells me that he has always been very private and they are very concerned about his understanding of his medical situation and decisions. We were unable to make much progress in our conversation. Mr. Kopera keeps repeatedly refers to the fact that he is moving to a new room. He is now saying that he wants a work-up from oncology and treatment options but he repeatedly tells me that he has different doctors telling him different things and he doesn't know what to believe. He tells me that one of his doctors told him not to worry about it and not to let anyone do any more testing on him. I tried to explain that he has more than one medical problem and that his doctors may be referring to different issues he has. He does not seem to understand or remember the information that has been shared with him. I spoke with Dr. Susie Cassette and recommend an evaluation to determine if he has capacity for decision making as I doubt this after our conversation. I will follow up Monday. Please call [phone] for acute needs over the weekend.    Goals of Care: 1.  Code Status: FULL   2. Disposition: SNF rehab.    3. Symptom Management:   1. Weakness: Continue medical management and antibiotics with PT and plan for rehab.  2. Malnutrition: Continue encouraging small frequent meals and feeding  supplements.  3. Pain in legs: Treating cellulitis.   4. Psychosocial: Emotional support provided to patient and to family at bedside.    Brief HPI: 74 yo male admitted with right leg pain r/t cellulitis sepsis. 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 Bertis Ruddy and Dr. Gretel Acre at [facility] but he refused bone marrow biopsy and has not followed up with denial of his illness. PMH reviewed as well as notes from [facility].    ROS: + weakness, + decreased appetite, + pain in legs    PMH:  Past Medical History  Diagnosis Date  . CAD (coronary artery disease)     dx elsewheer in past, no documentation. Non-ischemic myovue 2007  . CHF (congestive heart failure)     dx elsewhere, no documentation. Normal EF by previous echo. Trival AS needs f/u ECHO by 2012  . Hypertension   . Sleep apnea, obstructive     at some point used CPAP, was d/c  years ago  . Anemia   . History of thrombocytosis   . Allergic rhinitis   . Edema     R>L leg, u/s 5-12 neg for DVT  . Hemorrhoid   . Type II diabetes mellitus   . Sinus congestion   . Migraine     "once/wk at least" (07/11/2013)  . Shortness of breath      PSH:  Past Surgical History  Procedure Laterality Date  . Toe surgery Right     "tried to straighten out big toe" (07/11/2013)   I have reviewed the FH and SH and  If appropriate update it with new information. No Known Allergies Scheduled Meds: Continuous Infusions: PRN Meds:.    BP 127/80 mmHg  Pulse 113  Temp(Src) 98.1 F (36.7 C) (Oral)  Resp 21  Ht 6\' 5"  (1.956 m)  Wt 103.874 kg (229 lb)  BMI 27.15 kg/m2  SpO2 98%   PPS: 30%   Physical Exam:  General: NAD, ill-appearing, frail HEENT: [place]/AT, no JVD, moist mucous membranes Chest: No labored breathing, symmetric CVS: RRR, S1 S2 Abdomen: Soft, NT, ND, +BS Ext: BLE edema, erythema improved, tenderness improved  Neuro: Awake, alert, oriented x 3    Labs: CBC    Component Value  Date/Time   WBC 20.8* 04/16/2014 0804   WBC 18.4* 11/16/2013 0847   RBC 3.28* 04/16/2014 0804   RBC 3.82* 11/16/2013 0847   HGB 7.8* 04/16/2014 0804   HGB 9.8* 11/16/2013 0847   HCT 26.1* 04/16/2014 0804   HCT 30.7* 11/16/2013 0847   PLT 464* 04/16/2014 0804   PLT 323 11/16/2013 0847   MCV 79.6 04/16/2014 0804   MCV 80.3 11/16/2013 0847   MCH 23.8* 04/16/2014 0804   MCH 25.5* 11/16/2013 0847   MCHC 29.9* 04/16/2014 0804   MCHC 31.8* 11/16/2013 0847   RDW 20.2* 04/16/2014 0804   RDW 20.0* 11/16/2013 0847   LYMPHSABS 4.8* 04/10/2014 0525   MONOABS 0.8 04/10/2014 0525   EOSABS 0.6 04/10/2014 0525   BASOSABS 0.2* 04/10/2014 0525    BMET    Component Value Date/Time   NA 140 04/16/2014 0804   NA 138 11/16/2013 0847   K 5.0 04/16/2014 0804   K 4.5 11/16/2013 0847   CL 105 04/16/2014 0804   CO2 23 04/16/2014 0804   CO2 22 11/16/2013 0847   GLUCOSE 107* 04/16/2014 0804   GLUCOSE 126 11/16/2013 0847   BUN 19 04/16/2014 0804   BUN 20.2 11/16/2013 0847   CREATININE 1.21 04/16/2014 0804   CREATININE 1.2 11/16/2013 0847   CALCIUM 9.0 04/16/2014 0804   CALCIUM 9.8 11/16/2013 0847   GFRNONAA 58* 04/16/2014 0804   GFRAA 67* 04/16/2014 0804    CMP     Component Value Date/Time   NA 140 04/16/2014 0804   NA 138 11/16/2013 0847   K 5.0 04/16/2014 0804   K 4.5 11/16/2013 0847   CL 105 04/16/2014 0804   CO2 23 04/16/2014 0804   CO2 22 11/16/2013 0847   GLUCOSE 107* 04/16/2014 0804   GLUCOSE 126 11/16/2013 0847   BUN 19 04/16/2014 0804   BUN 20.2 11/16/2013 0847   CREATININE 1.21 04/16/2014 0804   CREATININE 1.2 11/16/2013 0847   CALCIUM 9.0 04/16/2014 0804   CALCIUM 9.8 11/16/2013 0847   PROT 6.0 04/02/2014 0226   PROT 6.7 11/16/2013 0847   ALBUMIN 1.9* 04/02/2014 0226   ALBUMIN 3.4* 11/16/2013 0847   AST 18 04/02/2014 0226   AST 25 11/16/2013 0847   ALT 12 04/02/2014 0226   ALT 11 11/16/2013 0847   ALKPHOS 136* 04/02/2014 0226   ALKPHOS 125 11/16/2013 0847    BILITOT 0.5 04/02/2014 0226   BILITOT 0.35 11/16/2013 0847   GFRNONAA 58* 04/16/2014 0804   GFRAA 67* 04/16/2014 0804    90 min  Greater than 50%  of this time was spent counseling and coordinating care related to the  above assessment and plan.  Vinie Sill, NP Palliative Medicine Team Pager # [phone] (M-F 8a-5p) Team Phone # [phone] (Nights/Weekends)

## 2014-06-12 ENCOUNTER — Non-Acute Institutional Stay (SKILLED_NURSING_FACILITY): Payer: Medicare Other | Admitting: Nurse Practitioner

## 2014-06-12 DIAGNOSIS — Z91199 Patient's noncompliance with other medical treatment and regimen due to unspecified reason: Secondary | ICD-10-CM

## 2014-06-12 DIAGNOSIS — N183 Chronic kidney disease, stage 3 unspecified: Secondary | ICD-10-CM

## 2014-06-12 DIAGNOSIS — G629 Polyneuropathy, unspecified: Secondary | ICD-10-CM | POA: Diagnosis not present

## 2014-06-12 DIAGNOSIS — I5032 Chronic diastolic (congestive) heart failure: Secondary | ICD-10-CM

## 2014-06-12 DIAGNOSIS — Z9119 Patient's noncompliance with other medical treatment and regimen: Secondary | ICD-10-CM

## 2014-06-12 DIAGNOSIS — R609 Edema, unspecified: Secondary | ICD-10-CM

## 2014-06-12 DIAGNOSIS — I10 Essential (primary) hypertension: Secondary | ICD-10-CM | POA: Diagnosis not present

## 2014-06-12 NOTE — Progress Notes (Signed)
Patient ID: Juan [name], male   DOB: [birth year], 73 y.o.   MRN: [id number]    Nursing Home Location:  [place] of Service: SNF (31)  PCP: [name], MD  No Known Allergies  Chief Complaint  Patient presents with  . Discharge Note    HPI:  Patient is a 74 y.o. male seen today at [facility] for discharge home Pt with a pmh of  CAD, diastolic CHF, hypertension, diabetes type 2, myelodysplastic syndrome. Pt here at [facility] after hospitalization due to chest pain for ongoing rehab. Pt is not participating in rehab therefore being discharged from services and will be discharged home. Pt will be going home with his son who will be helping pt. Pt has not been ambulatory at [facility].  Review of Systems:  Review of Systems  Constitutional: Negative for activity change, appetite change, fatigue and unexpected weight change.  HENT: Negative for congestion and hearing loss.   Eyes: Negative.   Respiratory: Negative for cough and shortness of breath.   Cardiovascular: Positive for leg swelling. Negative for chest pain and palpitations.  Gastrointestinal: Negative for abdominal pain, diarrhea and constipation.  Musculoskeletal: Negative for myalgias and arthralgias.  Skin: Negative for color change and wound.  Neurological: Positive for weakness. Negative for dizziness.  Psychiatric/Behavioral: Positive for confusion. Negative for behavioral problems and agitation.    Past Medical History  Diagnosis Date  . CAD (coronary artery disease)     dx elsewheer in past, no documentation. Non-ischemic myovue 2007  . CHF (congestive heart failure)     dx elsewhere, no documentation. Normal EF by previous echo. Trival AS needs f/u ECHO by 2012  . Hypertension   . Sleep apnea, obstructive     at some point used CPAP, was d/c  years ago  . Anemia   . History of thrombocytosis   . Allergic rhinitis   . Edema     R>L leg, u/s 5-12 neg for DVT  . Hemorrhoid     . Type II diabetes mellitus   . Sinus congestion   . Migraine     "once/wk at least" (07/11/2013)  . Shortness of breath    Past Surgical History  Procedure Laterality Date  . Toe surgery Right     "tried to straighten out big toe" (07/11/2013)   Social History:   reports that he quit smoking about 48 years ago. His smoking use included Cigarettes. He has a 3 pack-year smoking history. He has never used smokeless tobacco. He reports that he does not drink alcohol or use illicit drugs.  Family History  Problem Relation Age of Onset  . Colon cancer Neg Hx   . Prostate cancer Neg Hx   . Heart attack Neg Hx   . Diabetes Neg Hx   . Schizophrenia Son     Medications: Patient's Medications  New Prescriptions   No medications on file  Previous Medications   ACETAMINOPHEN (TYLENOL) 500 MG TABLET    Take 500-1,000 mg by mouth every 6 (six) hours as needed for moderate pain or headache.    ASPIRIN 325 MG TABLET    Take 1 tablet (325 mg total) by mouth daily.   B COMPLEX VITAMINS CAPSULE    Take 1 capsule by mouth daily.   FEEDING SUPPLEMENT, GLUCERNA SHAKE, (GLUCERNA SHAKE) LIQD    Take 237 mLs by mouth 2 (two) times daily between meals.   FERROUS SULFATE DRIED 200 (65 FE) MG TABS  Take 1 tablet by mouth daily.    FLUTICASONE (FLONASE) 50 MCG/ACT NASAL SPRAY    Place 2 sprays into both nostrils daily.    FOLIC ACID (FOLVITE) 1 MG TABLET    Take 1 mg by mouth daily.    FUROSEMIDE (LASIX) 40 MG TABLET    Take 1 tablet (40 mg total) by mouth 2 (two) times daily.   GABAPENTIN (NEURONTIN) 100 MG CAPSULE    Take 1 capsule (100 mg total) by mouth at bedtime.   HYDROCERIN (EUCERIN) CREA    Apply Eucerin cream to BLE Q day after bathing and roughly towel drying to remove loose skin   HYDROCODONE-ACETAMINOPHEN (NORCO/VICODIN) 5-325 MG PER TABLET    Take 1 tablet by mouth every 6 (six) hours as needed for moderate pain.   METOPROLOL TARTRATE (LOPRESSOR) 25 MG TABLET    Take 1 tablet (25 mg total)  by mouth 2 (two) times daily.   PANTOPRAZOLE (PROTONIX) 40 MG TABLET    Take 1 tablet (40 mg total) by mouth daily.   POTASSIUM CHLORIDE SA (K-DUR,KLOR-CON) 20 MEQ TABLET    Take 20 mEq by mouth daily.   SODIUM CHLORIDE (OCEAN) 0.65 % SOLN NASAL SPRAY    Place 1-2 sprays into both nostrils every 4 (four) hours as needed for congestion.   ZOLPIDEM (AMBIEN) 5 MG TABLET    Take 1 tablet (5 mg total) by mouth at bedtime.  Modified Medications   No medications on file  Discontinued Medications   No medications on file     Physical Exam: Filed Vitals:   06/12/14 1835  BP: 110/66  Pulse: 64  Temp: 97.2 F (36.2 C)  Resp: 20    Physical Exam  Constitutional: He is oriented to person, place, and time. He appears well-developed and well-nourished. No distress.  HENT:  Head: Normocephalic and atraumatic.  Mouth/Throat: Oropharynx is clear and moist. No oropharyngeal exudate.  Eyes: Conjunctivae and EOM are normal. Pupils are equal, round, and reactive to light.  Neck: Normal range of motion. Neck supple.  Cardiovascular: Normal rate, regular rhythm and normal heart sounds.   Pulmonary/Chest: Effort normal and breath sounds normal.  Abdominal: Soft. Bowel sounds are normal.  Musculoskeletal: He exhibits edema (trace edema bilaterally). He exhibits no tenderness.  Neurological: He is alert and oriented to person, place, and time.  Skin: Skin is warm and dry. He is not diaphoretic.  Small stage 2 pressure ulcer to right buttocks.   Psychiatric: His mood appears anxious. He expresses inappropriate judgment.    Labs reviewed: Basic Metabolic Panel:  Recent Labs  04/07/14 0545 04/14/14 2340 04/15/14 0406 04/16/14 0804  NA 140 137 139 140  K 4.9 5.0 5.0 5.0  CL 105 105 103 105  CO2 23  --  23 23  GLUCOSE 104* 103* 93 107*  BUN 35* 23 22 19   CREATININE 1.98* 1.40* 1.27 1.21  CALCIUM 8.8  --  9.0 9.0   Liver Function Tests:  Recent Labs  03/31/14 0350 04/01/14 0230  04/02/14 0226  AST 18 22 18   ALT 12 12 12   ALKPHOS 101 122* 136*  BILITOT 0.5 0.5 0.5  PROT 5.8* 6.1 6.0  ALBUMIN 2.2* 2.0* 1.9*   No results for input(s): LIPASE, AMYLASE in the last 8760 hours. No results for input(s): AMMONIA in the last 8760 hours. CBC:  Recent Labs  04/08/14 0431 04/09/14 0640 04/10/14 0525 04/14/14 2327 04/14/14 2340 04/15/14 0406 04/16/14 0804  WBC 24.6* 24.2* 20.8* 21.2*  --  19.2* 20.8*  NEUTROABS 17.2* 16.7* 13.5*  --   --   --   --   HGB 7.8* 8.0* 7.6* 7.8* 8.5* 7.8* 7.8*  HCT 25.3* 26.9* 24.9* 25.3* 25.0* 25.5* 26.1*  MCV 81.1 81.3 80.3 80.8  --  81.0 79.6  PLT 389 390 382 PLATELET CLUMPS NOTED ON SMEAR, COUNT APPEARS ADEQUATE  --  409* 464*   TSH: No results for input(s): TSH in the last 8760 hours. A1C: Lab Results  Component Value Date   HGBA1C 6.0 10/19/2013   Lipid Panel: No results for input(s): CHOL, HDL, LDLCALC, TRIG, CHOLHDL, LDLDIRECT in the last 8760 hours.  CBC NO Diff (Complete Blood Count)    Result: 04/23/2014 3:42 PM   ( Status: F )       WBC 17.5   H 4.0-10.5 K/uL SLN   RBC 3.28   L 4.22-5.81 MIL/uL SLN   Hemoglobin 7.9   L 13.0-17.0 g/dL SLN   Hematocrit 25.1   L 39.0-52.0 % SLN   MCV 76.5   L 78.0-100.0 fL SLN   MCH 24.1   L 26.0-34.0 pg SLN   MCHC 31.5     30.0-36.0 g/dL SLN   RDW 20.1   H 11.5-15.5 % SLN   Platelet Count 621   H 150-400 K/uL SLN   MPV 9.8     9.4-12.4 fL SLN   Basic Metabolic Panel    Result: 04/23/2014 3:37 PM   ( Status: F )       Sodium 135     135-145 mEq/L SLN   Potassium 5.4   H 3.5-5.3 mEq/L SLN C Chloride 103     96-112 mEq/L SLN   CO2 25     19-32 mEq/L SLN   Glucose 102   H 70-99 mg/dL SLN   BUN 25   H 6-23 mg/dL SLN   Creatinine 1.37   H 0.50-1.35 mg/dL SLN   Calcium 9.3     8.4-10.5 mg/dL SLN   Prealbumin    Result: 04/23/2014 5:17 PM   ( Status: F )       Prealbumin 15.8   L 17.0-34.0 mg/dL SLN   -- END OF REPORT --  Assessment/Plan  1. Chronic diastolic CHF (congestive  heart failure) Stable at this time. conts lasix, betablocker, ASA  2. Edema Unchanged.   3. Neuropathy conts gabapentin   4. CKD (chronic kidney disease) stage 3, GFR 30-59 ml/min Stable  5. Essential hypertension Patient is stable; continue current regimen.   6. Poor compliance with advise  Since pt is not willing to participate in rehab pt to be discharged home with son. Pt is stable for discharge. Pt will need PT/OT/Nursing/SW per home health. DME ordered, hoyer lift, BSC, 3:1. Rx written.  will need to follow up with PCP next week to follow up lab work.

## 2014-06-13 ENCOUNTER — Ambulatory Visit: Payer: Medicare Other | Admitting: Medical

## 2014-06-13 ENCOUNTER — Telehealth: Payer: Self-pay | Admitting: *Deleted

## 2014-06-13 ENCOUNTER — Telehealth: Payer: Self-pay | Admitting: Internal Medicine

## 2014-06-13 DIAGNOSIS — L89213 Pressure ulcer of right hip, stage 3: Secondary | ICD-10-CM | POA: Diagnosis not present

## 2014-06-13 NOTE — Telephone Encounter (Signed)
Pt did not have ride. Will not charge no show fee.

## 2014-06-13 NOTE — Telephone Encounter (Signed)
error:[id number] ° °

## 2014-06-13 NOTE — Telephone Encounter (Signed)
Pt called and stated he would reschedule when he could get a ride.  Charge no show fee?

## 2014-06-14 NOTE — Telephone Encounter (Signed)
See note below

## 2014-06-15 ENCOUNTER — Telehealth: Payer: Self-pay | Admitting: Internal Medicine

## 2014-06-15 DIAGNOSIS — I5032 Chronic diastolic (congestive) heart failure: Secondary | ICD-10-CM | POA: Diagnosis not present

## 2014-06-15 DIAGNOSIS — I2510 Atherosclerotic heart disease of native coronary artery without angina pectoris: Secondary | ICD-10-CM | POA: Diagnosis not present

## 2014-06-15 DIAGNOSIS — E119 Type 2 diabetes mellitus without complications: Secondary | ICD-10-CM | POA: Diagnosis not present

## 2014-06-15 DIAGNOSIS — I872 Venous insufficiency (chronic) (peripheral): Secondary | ICD-10-CM | POA: Diagnosis not present

## 2014-06-15 DIAGNOSIS — I129 Hypertensive chronic kidney disease with stage 1 through stage 4 chronic kidney disease, or unspecified chronic kidney disease: Secondary | ICD-10-CM | POA: Diagnosis not present

## 2014-06-15 DIAGNOSIS — N183 Chronic kidney disease, stage 3 (moderate): Secondary | ICD-10-CM | POA: Diagnosis not present

## 2014-06-15 NOTE — Telephone Encounter (Signed)
Spoke with Nira Conn, informed her of Dr. Larose Kells recommendations. Heather informed me that Pt has "exhausted what Medicare will pay" at this time, Pts vital signs are currently stable, and informs that Pt is not willing to go anywhere at this time. Verbals given for Education officer, museum to evaluate.

## 2014-06-15 NOTE — Telephone Encounter (Signed)
Noted, were told will keep posted

## 2014-06-15 NOTE — Telephone Encounter (Signed)
Absolutely, if he is not in a safe environment he needs to go back to the nursing home (or ER)

## 2014-06-15 NOTE — Telephone Encounter (Signed)
Please advise 

## 2014-06-15 NOTE — Telephone Encounter (Signed)
Caller name: heather hogan from [facility] Relation to pt: Call back number: [phone] Pharmacy:  Reason for call:   Nira Conn states that she went to patient home and says that patient was sitting in two days worth of feces. Nira Conn wants to know if she can have an order for social worker?

## 2014-06-16 ENCOUNTER — Encounter (HOSPITAL_COMMUNITY): Payer: Self-pay | Admitting: *Deleted

## 2014-06-16 ENCOUNTER — Emergency Department (HOSPITAL_COMMUNITY): Payer: Medicare Other

## 2014-06-16 ENCOUNTER — Inpatient Hospital Stay (HOSPITAL_COMMUNITY)
Admission: EM | Admit: 2014-06-16 | Discharge: 2014-06-25 | DRG: 194 | Disposition: A | Discharge status: Skilled Nursing Facility | Payer: Medicare Other | Attending: Internal Medicine | Admitting: Internal Medicine

## 2014-06-16 DIAGNOSIS — R0602 Shortness of breath: Secondary | ICD-10-CM | POA: Diagnosis not present

## 2014-06-16 DIAGNOSIS — J189 Pneumonia, unspecified organism: Secondary | ICD-10-CM | POA: Diagnosis not present

## 2014-06-16 DIAGNOSIS — Z7982 Long term (current) use of aspirin: Secondary | ICD-10-CM

## 2014-06-16 DIAGNOSIS — M79606 Pain in leg, unspecified: Secondary | ICD-10-CM

## 2014-06-16 DIAGNOSIS — R531 Weakness: Secondary | ICD-10-CM | POA: Diagnosis not present

## 2014-06-16 DIAGNOSIS — Z79899 Other long term (current) drug therapy: Secondary | ICD-10-CM

## 2014-06-16 DIAGNOSIS — C946 Myelodysplastic disease, not classified: Secondary | ICD-10-CM | POA: Diagnosis present

## 2014-06-16 DIAGNOSIS — R262 Difficulty in walking, not elsewhere classified: Secondary | ICD-10-CM

## 2014-06-16 DIAGNOSIS — E1140 Type 2 diabetes mellitus with diabetic neuropathy, unspecified: Secondary | ICD-10-CM | POA: Diagnosis present

## 2014-06-16 DIAGNOSIS — R600 Localized edema: Secondary | ICD-10-CM | POA: Insufficient documentation

## 2014-06-16 DIAGNOSIS — Z7951 Long term (current) use of inhaled steroids: Secondary | ICD-10-CM

## 2014-06-16 DIAGNOSIS — L03116 Cellulitis of left lower limb: Secondary | ICD-10-CM | POA: Diagnosis present

## 2014-06-16 DIAGNOSIS — G4733 Obstructive sleep apnea (adult) (pediatric): Secondary | ICD-10-CM | POA: Diagnosis present

## 2014-06-16 DIAGNOSIS — N183 Chronic kidney disease, stage 3 unspecified: Secondary | ICD-10-CM | POA: Diagnosis present

## 2014-06-16 DIAGNOSIS — I5032 Chronic diastolic (congestive) heart failure: Secondary | ICD-10-CM | POA: Diagnosis not present

## 2014-06-16 DIAGNOSIS — R609 Edema, unspecified: Secondary | ICD-10-CM

## 2014-06-16 DIAGNOSIS — L03115 Cellulitis of right lower limb: Secondary | ICD-10-CM | POA: Diagnosis present

## 2014-06-16 DIAGNOSIS — C9200 Acute myeloblastic leukemia, not having achieved remission: Secondary | ICD-10-CM | POA: Diagnosis present

## 2014-06-16 DIAGNOSIS — R634 Abnormal weight loss: Secondary | ICD-10-CM | POA: Diagnosis present

## 2014-06-16 DIAGNOSIS — I2510 Atherosclerotic heart disease of native coronary artery without angina pectoris: Secondary | ICD-10-CM | POA: Diagnosis present

## 2014-06-16 DIAGNOSIS — D649 Anemia, unspecified: Secondary | ICD-10-CM

## 2014-06-16 DIAGNOSIS — I872 Venous insufficiency (chronic) (peripheral): Secondary | ICD-10-CM | POA: Diagnosis present

## 2014-06-16 DIAGNOSIS — I10 Essential (primary) hypertension: Secondary | ICD-10-CM | POA: Diagnosis not present

## 2014-06-16 DIAGNOSIS — I129 Hypertensive chronic kidney disease with stage 1 through stage 4 chronic kidney disease, or unspecified chronic kidney disease: Secondary | ICD-10-CM | POA: Diagnosis present

## 2014-06-16 DIAGNOSIS — M79669 Pain in unspecified lower leg: Secondary | ICD-10-CM | POA: Diagnosis not present

## 2014-06-16 DIAGNOSIS — R32 Unspecified urinary incontinence: Secondary | ICD-10-CM

## 2014-06-16 DIAGNOSIS — L89629 Pressure ulcer of left heel, unspecified stage: Secondary | ICD-10-CM | POA: Diagnosis present

## 2014-06-16 DIAGNOSIS — R627 Adult failure to thrive: Secondary | ICD-10-CM | POA: Diagnosis present

## 2014-06-16 DIAGNOSIS — D638 Anemia in other chronic diseases classified elsewhere: Secondary | ICD-10-CM | POA: Diagnosis present

## 2014-06-16 DIAGNOSIS — R1110 Vomiting, unspecified: Secondary | ICD-10-CM

## 2014-06-16 DIAGNOSIS — D7581 Myelofibrosis: Secondary | ICD-10-CM | POA: Diagnosis present

## 2014-06-16 DIAGNOSIS — Y95 Nosocomial condition: Secondary | ICD-10-CM | POA: Diagnosis present

## 2014-06-16 DIAGNOSIS — I712 Thoracic aortic aneurysm, without rupture: Secondary | ICD-10-CM | POA: Diagnosis present

## 2014-06-16 DIAGNOSIS — R918 Other nonspecific abnormal finding of lung field: Secondary | ICD-10-CM | POA: Diagnosis not present

## 2014-06-16 DIAGNOSIS — R5381 Other malaise: Secondary | ICD-10-CM | POA: Diagnosis present

## 2014-06-16 DIAGNOSIS — L89619 Pressure ulcer of right heel, unspecified stage: Secondary | ICD-10-CM | POA: Diagnosis present

## 2014-06-16 DIAGNOSIS — Z87891 Personal history of nicotine dependence: Secondary | ICD-10-CM

## 2014-06-16 HISTORY — DX: Thoracic aortic aneurysm, without rupture: I71.2

## 2014-06-16 HISTORY — DX: Chronic myeloproliferative disease: D47.1

## 2014-06-16 LAB — URINALYSIS, ROUTINE W REFLEX MICROSCOPIC
Bilirubin Urine: NEGATIVE
Glucose, UA: NEGATIVE mg/dL
Hgb urine dipstick: NEGATIVE
Ketones, ur: NEGATIVE mg/dL
Leukocytes, UA: NEGATIVE
NITRITE: NEGATIVE
Protein, ur: NEGATIVE mg/dL
Specific Gravity, Urine: 1.013 (ref 1.005–1.030)
UROBILINOGEN UA: 0.2 mg/dL (ref 0.0–1.0)
pH: 5 (ref 5.0–8.0)

## 2014-06-16 LAB — COMPREHENSIVE METABOLIC PANEL
ALBUMIN: 3.3 g/dL — AB (ref 3.5–5.2)
ALT: 11 U/L (ref 0–53)
AST: 22 U/L (ref 0–37)
Alkaline Phosphatase: 97 U/L (ref 39–117)
Anion gap: 11 (ref 5–15)
BILIRUBIN TOTAL: 0.4 mg/dL (ref 0.3–1.2)
BUN: 35 mg/dL — AB (ref 6–23)
CALCIUM: 10 mg/dL (ref 8.4–10.5)
CHLORIDE: 111 mmol/L (ref 96–112)
CO2: 20 mmol/L (ref 19–32)
CREATININE: 1.37 mg/dL — AB (ref 0.50–1.35)
GFR calc Af Amer: 58 mL/min — ABNORMAL LOW (ref >90)
GFR, EST NON AFRICAN AMERICAN: 50 mL/min — AB (ref >90)
Glucose, Bld: 105 mg/dL — ABNORMAL HIGH (ref 70–99)
Potassium: 5 mmol/L (ref 3.5–5.1)
Sodium: 142 mmol/L (ref 135–145)
Total Protein: 6.6 g/dL (ref 6.0–8.3)

## 2014-06-16 LAB — CBC WITH DIFFERENTIAL/PLATELET
Band Neutrophils: 22 % — ABNORMAL HIGH (ref 0–10)
Basophils Absolute: 0.8 10*3/uL — ABNORMAL HIGH (ref 0.0–0.1)
Basophils Relative: 4 % — ABNORMAL HIGH (ref 0–1)
Blasts: 2 %
EOS ABS: 1.3 10*3/uL — AB (ref 0.0–0.7)
EOS PCT: 6 % — AB (ref 0–5)
HCT: 28.6 % — ABNORMAL LOW (ref 39.0–52.0)
Hemoglobin: 8.8 g/dL — ABNORMAL LOW (ref 13.0–17.0)
Lymphocytes Relative: 13 % (ref 12–46)
Lymphs Abs: 2.7 10*3/uL (ref 0.7–4.0)
MCH: 25.4 pg — AB (ref 26.0–34.0)
MCHC: 30.8 g/dL (ref 30.0–36.0)
MCV: 82.4 fL (ref 78.0–100.0)
MONO ABS: 1.1 10*3/uL — AB (ref 0.1–1.0)
Metamyelocytes Relative: 8 %
Monocytes Relative: 5 % (ref 3–12)
Myelocytes: 3 %
NEUTROS PCT: 37 % — AB (ref 43–77)
NRBC: 2 /100{WBCs} — AB
Neutro Abs: 14.7 10*3/uL — ABNORMAL HIGH (ref 1.7–7.7)
PLATELETS: 323 10*3/uL (ref 150–400)
Promyelocytes Absolute: 0 %
RBC: 3.47 MIL/uL — ABNORMAL LOW (ref 4.22–5.81)
RDW: 20.1 % — AB (ref 11.5–15.5)
WBC: 21 10*3/uL — ABNORMAL HIGH (ref 4.0–10.5)

## 2014-06-16 LAB — TROPONIN I: Troponin I: <0.03 ng/mL (ref <0.031)

## 2014-06-16 LAB — BRAIN NATRIURETIC PEPTIDE: B Natriuretic Peptide: 18.8 pg/mL (ref 0.0–100.0)

## 2014-06-16 NOTE — ED Notes (Signed)
Patient states he is unable to stand even to do orthostatic vital signs . Patient had extreme difficulty moving his legs to place himself in a sitting position.

## 2014-06-16 NOTE — Progress Notes (Signed)
VASCULAR LAB PRELIMINARY  PRELIMINARY  PRELIMINARY  PRELIMINARY  Bilateral lower extremity venous Dopplers completed.    Preliminary report:  There is no DVT or SVT noted in the bilateral lower extremities.  Rodgerick Gilliand, RVT 06/16/2014, 7:18 PM

## 2014-06-16 NOTE — ED Notes (Signed)
Per EMS- pt called out for leg swelling and pain that increased during the night. Pt was recently at [facility] for rehab for same. Pt was recently diagnosed with heart failure. Pt VSS with EMS. Not noted to have swelling and weeping bilaterally to legs.

## 2014-06-16 NOTE — ED Provider Notes (Signed)
CSN: [id number]     Arrival date & time 06/16/14  1757 History   First MD Initiated Contact with Patient 06/16/14 1834     Chief Complaint  Patient presents with  . Leg Swelling     (Consider location/radiation/quality/duration/timing/severity/associated sxs/prior Treatment) The history is provided by the patient and medical records. No language interpreter was used.     Juan [name] is a 74 y.o. male  with a hx of coronary artery disease, congestive heart failure, hypertension, sleep apnea, anemia, or monocytosis, peripheral edema, type 2 diabetes, AML (currently untreated)presents to the Emergency Department complaining of gradual, persistent, progressively worsening swelling and pain in his legs onset last night.  Patient reports that he was discharged from rehabilitation on 06/13/2014.  He reports that he has been at home alone since this time. He reports that he has been unable to walk at home since his discharge. He reports that after his legs began to swell yesterday his pain in his legs increase. He denies chest pain or shortness of breath.  He denies fevers or chills, palpitations, neck pain, shortness of breath, abdominal pain, nausea, vomiting, diarrhea, weakness, dizziness, syncope, dysuria. Nothing seems to make his symptoms better or worse.    Past Medical History  Diagnosis Date  . CAD (coronary artery disease)     dx elsewheer in past, no documentation. Non-ischemic myovue 2007  . CHF (congestive heart failure)     dx elsewhere, no documentation. Normal EF by previous echo. Trival AS needs f/u ECHO by 2012  . Hypertension   . Sleep apnea, obstructive     at some point used CPAP, was d/c  years ago  . Anemia   . History of thrombocytosis   . Allergic rhinitis   . Edema     R>L leg, u/s 5-12 neg for DVT  . Hemorrhoid   . Type II diabetes mellitus   . Sinus congestion   . Migraine     "once/wk at least" (07/11/2013)  . Shortness of breath   . Myeloproliferative disease    . Ascending aortic aneurysm 03/2014    4.3cm on CT scan   Past Surgical History  Procedure Laterality Date  . Toe surgery Right     "tried to straighten out big toe" (07/11/2013)   Family History  Problem Relation Age of Onset  . Colon cancer Neg Hx   . Prostate cancer Neg Hx   . Heart attack Neg Hx   . Diabetes Neg Hx   . Schizophrenia Son    History  Substance Use Topics  . Smoking status: Former Smoker -- 0.25 packs/day for 12 years    Types: Cigarettes    Quit date: 05/18/1966  . Smokeless tobacco: Never Used  . Alcohol Use: No    Review of Systems  Constitutional: Negative for fever, diaphoresis, appetite change, fatigue and unexpected weight change.  HENT: Negative for mouth sores.   Eyes: Negative for visual disturbance.  Respiratory: Negative for cough, chest tightness, shortness of breath and wheezing.   Cardiovascular: Positive for leg swelling (bilateral). Negative for chest pain.  Gastrointestinal: Negative for nausea, vomiting, abdominal pain, diarrhea and constipation.  Endocrine: Negative for polydipsia, polyphagia and polyuria.  Genitourinary: Negative for dysuria, urgency, frequency and hematuria.  Musculoskeletal: Negative for back pain and neck stiffness.  Skin: Negative for rash.  Allergic/Immunologic: Negative for immunocompromised state.  Neurological: Negative for syncope, light-headedness and headaches.  Hematological: Does not bruise/bleed easily.  Psychiatric/Behavioral: Negative for sleep disturbance. The patient  is not nervous/anxious.       Allergies  Review of patient's allergies indicates no known allergies.  Home Medications   Prior to Admission medications   Medication Sig Start Date End Date Taking? Authorizing Provider  acetaminophen (TYLENOL) 500 MG tablet Take 500-1,000 mg by mouth every 6 (six) hours as needed for moderate pain or headache.    Yes Historical Provider, MD  aspirin 325 MG tablet Take 1 tablet (325 mg total) by  mouth daily. 04/16/14  Yes Ripudeep K Rai, MD  b complex vitamins capsule Take 1 capsule by mouth daily. 01/12/13  Yes Irene Pap, NP  feeding supplement, GLUCERNA SHAKE, (GLUCERNA SHAKE) LIQD Take 237 mLs by mouth 2 (two) times daily between meals. 04/10/14  Yes Barton Dubois, MD  Ferrous Sulfate Dried 200 (65 FE) MG TABS Take 1 tablet by mouth daily.    Yes Historical Provider, MD  fluticasone (FLONASE) 50 MCG/ACT nasal spray Place 2 sprays into both nostrils daily.    Yes Historical Provider, MD  folic acid (FOLVITE) 1 MG tablet Take 1 mg by mouth daily.    Yes Historical Provider, MD  furosemide (LASIX) 40 MG tablet Take 1 tablet (40 mg total) by mouth 2 (two) times daily. 04/10/14  Yes Barton Dubois, MD  gabapentin (NEURONTIN) 100 MG capsule Take 1 capsule (100 mg total) by mouth at bedtime. 04/10/14  Yes Barton Dubois, MD  hydrocerin (EUCERIN) CREA Apply Eucerin cream to BLE Q day after bathing and roughly towel drying to remove loose skin 01/27/14  Yes Shanker Kristeen Mans, MD  HYDROcodone-acetaminophen (NORCO/VICODIN) 5-325 MG per tablet Take 1 tablet by mouth every 6 (six) hours as needed for moderate pain. 04/16/14  Yes Ripudeep Krystal Eaton, MD  metoprolol tartrate (LOPRESSOR) 25 MG tablet Take 1 tablet (25 mg total) by mouth 2 (two) times daily. 04/16/14  Yes Ripudeep Krystal Eaton, MD  pantoprazole (PROTONIX) 40 MG tablet Take 1 tablet (40 mg total) by mouth daily. 04/10/14  Yes Barton Dubois, MD  potassium chloride SA (K-DUR,KLOR-CON) 20 MEQ tablet Take 20 mEq by mouth daily.   Yes Historical Provider, MD  sodium chloride (OCEAN) 0.65 % SOLN nasal spray Place 1-2 sprays into both nostrils every 4 (four) hours as needed for congestion. 04/10/14  Yes Barton Dubois, MD  zolpidem (AMBIEN) 5 MG tablet Take 1 tablet (5 mg total) by mouth at bedtime. 05/14/14  Yes Lauree Chandler, NP   BP 116/67 mmHg  Pulse 83  Temp(Src) 98.9 F (37.2 C) (Rectal)  Resp 18  SpO2 100% Physical Exam  Constitutional: He  appears well-developed and well-nourished. No distress.  Awake, alert, nontoxic appearance  HENT:  Head: Normocephalic and atraumatic.  Mouth/Throat: Oropharynx is clear and moist. No oropharyngeal exudate.  Eyes: Conjunctivae are normal. No scleral icterus.  Neck: Normal range of motion. Neck supple.  Cardiovascular: Normal rate, regular rhythm, normal heart sounds and intact distal pulses.   Pulmonary/Chest: Effort normal. No respiratory distress. He has decreased breath sounds. He has no wheezes. He exhibits no bony tenderness.  Equal chest expansion Decreased breath sounds throughout  Abdominal: Soft. Bowel sounds are normal. He exhibits no mass. There is no tenderness. There is no rebound and no guarding.  Musculoskeletal: Normal range of motion. He exhibits edema and tenderness.  3+ pitting edema bilaterally with increased warmth, no induration or weeping.  Neurological: He is alert. He exhibits normal muscle tone. Coordination normal.  Speech is clear and goal oriented Moves extremities without ataxia  Skin: Skin is warm and dry. No rash noted. He is not diaphoretic. There is erythema.  Erythema of the bilateral lower legs without induration  Psychiatric: He has a normal mood and affect.  Nursing note and vitals reviewed.   ED Course  Procedures (including critical care time) Labs Review Labs Reviewed  CBC WITH DIFFERENTIAL/PLATELET - Abnormal; Notable for the following:    WBC 21.0 (*)    RBC 3.47 (*)    Hemoglobin 8.8 (*)    HCT 28.6 (*)    MCH 25.4 (*)    RDW 20.1 (*)    Neutrophils Relative % 37 (*)    Eosinophils Relative 6 (*)    Basophils Relative 4 (*)    Band Neutrophils 22 (*)    nRBC 2 (*)    Neutro Abs 14.7 (*)    Monocytes Absolute 1.1 (*)    Eosinophils Absolute 1.3 (*)    Basophils Absolute 0.8 (*)    All other components within normal limits  COMPREHENSIVE METABOLIC PANEL - Abnormal; Notable for the following:    Glucose, Bld 105 (*)    BUN 35 (*)     Creatinine, Ser 1.37 (*)    Albumin 3.3 (*)    GFR calc non Af Amer 50 (*)    GFR calc Af Amer 58 (*)    All other components within normal limits  URINALYSIS, ROUTINE W REFLEX MICROSCOPIC - Abnormal; Notable for the following:    APPearance CLOUDY (*)    All other components within normal limits  TROPONIN I  BRAIN NATRIURETIC PEPTIDE    Imaging Review Dg Chest 2 View  06/16/2014   CLINICAL DATA:  Lower extremity pain and swelling.  EXAM: CHEST  2 VIEW  COMPARISON:  Prior radiographs dated 04/15/2014, 03/31/2014, 11 04/2014, 01/26/2014 and 12/09/2013  FINDINGS: There is unchanged right hemidiaphragm elevation. There is unchanged cardiomegaly and aortic tortuosity. Scattered airspace opacities are again evident in the central and basilar regions of both lungs, without significant interval change. Diffuse bony sclerosis is again evident and may relate to the described history of a myeloproliferative disease.  IMPRESSION: Multifocal patchy airspace opacities in the central and basilar regions bilaterally, present on numerous prior studies although it does appear to have been slowly worsening compared to older examinations. Chronic infectious, inflammatory or neoplastic processes may be considered. No convincing acute findings are evident.   Electronically Signed   By: Andreas Newport M.D.   On: 06/16/2014 22:51     EKG Interpretation   Date/Time:  Saturday June 16 2014 18:29:10 EST Ventricular Rate:  90 PR Interval:  214 QRS Duration: 94 QT Interval:  365 QTC Calculation: 447 R Axis:   41 Text Interpretation:  Sinus rhythm with 1st degree A-V block Baseline  wander Artifact When compared with ECG of 04/14/2014 First degree A-V  block is now Present and  Rate slower Confirmed by [facility]  MD, Nunzio Cory  [phone]) on 06/16/2014 7:00:20 PM      MDM   Final diagnoses:  Peripheral edema  HCAP (healthcare-associated pneumonia)  Pain of lower extremity, unspecified laterality   Unable to ambulate  [name] presents with complaints of pain and swelling in his bilateral legs onset last night. Patient with history of CHF but denies chest pain or shortness of breath. Will begin cardiac workup. EKG nonischemic.  10:30PM Troponin negative, white blood cell count elevated however patient has history of AML and it is baseline. Hemoglobin 8.8. Creatinine 1.37 and close to baseline as  well.  Pertinent negative and BNP is not elevated. Chest x-ray pending.  11:28 PM Pt is unable to walk due to the swelling in his legs. Patient without evidence of congestive heart failure at this time. No evidence of ACS. His CXR shows a worsening of the multifocal patchy airspace opacities in the central and bibasilar regions.  Will treat for HCAP and admit.  12:36 AM Discussed with Dr. Arnoldo Morale who will admit to [facility].    The patient was discussed with and seen by Dr. Thurnell Garbe who agrees with the treatment plan.  BP 116/67 mmHg  Pulse 83  Temp(Src) 98.9 F (37.2 C) (Rectal)  Resp 18  SpO2 100%    Abigail Butts, PA-C 06/17/14 [place], DO 06/18/14 1349

## 2014-06-16 NOTE — ED Notes (Signed)
This nurse spoke with the PA and MD about Ambulating the Pt. This nurse advised MD and PA that pt was just turned and sacrum patch placed on sacrum and pt unable to move legs on his own. MD and PA requested to attempt to ambulate pt, by starting with Orthostatic vitals.

## 2014-06-16 NOTE — ED Notes (Signed)
This nurse placed a sacrum pad on pt pressure ulcer on sacrum.

## 2014-06-17 DIAGNOSIS — I714 Abdominal aortic aneurysm, without rupture: Secondary | ICD-10-CM | POA: Diagnosis not present

## 2014-06-17 DIAGNOSIS — D696 Thrombocytopenia, unspecified: Secondary | ICD-10-CM | POA: Diagnosis not present

## 2014-06-17 DIAGNOSIS — Z7982 Long term (current) use of aspirin: Secondary | ICD-10-CM | POA: Diagnosis not present

## 2014-06-17 DIAGNOSIS — I719 Aortic aneurysm of unspecified site, without rupture: Secondary | ICD-10-CM | POA: Diagnosis not present

## 2014-06-17 DIAGNOSIS — C7951 Secondary malignant neoplasm of bone: Secondary | ICD-10-CM | POA: Diagnosis not present

## 2014-06-17 DIAGNOSIS — J159 Unspecified bacterial pneumonia: Secondary | ICD-10-CM | POA: Diagnosis not present

## 2014-06-17 DIAGNOSIS — G4733 Obstructive sleep apnea (adult) (pediatric): Secondary | ICD-10-CM | POA: Diagnosis present

## 2014-06-17 DIAGNOSIS — E118 Type 2 diabetes mellitus with unspecified complications: Secondary | ICD-10-CM

## 2014-06-17 DIAGNOSIS — D649 Anemia, unspecified: Secondary | ICD-10-CM | POA: Diagnosis not present

## 2014-06-17 DIAGNOSIS — C946 Myelodysplastic disease, not classified: Secondary | ICD-10-CM | POA: Diagnosis present

## 2014-06-17 DIAGNOSIS — G8929 Other chronic pain: Secondary | ICD-10-CM | POA: Diagnosis not present

## 2014-06-17 DIAGNOSIS — D471 Chronic myeloproliferative disease: Secondary | ICD-10-CM | POA: Diagnosis not present

## 2014-06-17 DIAGNOSIS — M79606 Pain in leg, unspecified: Secondary | ICD-10-CM | POA: Diagnosis not present

## 2014-06-17 DIAGNOSIS — R5381 Other malaise: Secondary | ICD-10-CM | POA: Diagnosis present

## 2014-06-17 DIAGNOSIS — N183 Chronic kidney disease, stage 3 (moderate): Secondary | ICD-10-CM | POA: Diagnosis present

## 2014-06-17 DIAGNOSIS — D638 Anemia in other chronic diseases classified elsewhere: Secondary | ICD-10-CM | POA: Diagnosis present

## 2014-06-17 DIAGNOSIS — I509 Heart failure, unspecified: Secondary | ICD-10-CM | POA: Diagnosis not present

## 2014-06-17 DIAGNOSIS — I712 Thoracic aortic aneurysm, without rupture: Secondary | ICD-10-CM | POA: Diagnosis present

## 2014-06-17 DIAGNOSIS — C9200 Acute myeloblastic leukemia, not having achieved remission: Secondary | ICD-10-CM | POA: Diagnosis present

## 2014-06-17 DIAGNOSIS — R1110 Vomiting, unspecified: Secondary | ICD-10-CM | POA: Diagnosis not present

## 2014-06-17 DIAGNOSIS — M5127 Other intervertebral disc displacement, lumbosacral region: Secondary | ICD-10-CM | POA: Diagnosis not present

## 2014-06-17 DIAGNOSIS — E569 Vitamin deficiency, unspecified: Secondary | ICD-10-CM | POA: Diagnosis not present

## 2014-06-17 DIAGNOSIS — L89619 Pressure ulcer of right heel, unspecified stage: Secondary | ICD-10-CM | POA: Diagnosis present

## 2014-06-17 DIAGNOSIS — R609 Edema, unspecified: Secondary | ICD-10-CM | POA: Diagnosis not present

## 2014-06-17 DIAGNOSIS — L03115 Cellulitis of right lower limb: Secondary | ICD-10-CM | POA: Diagnosis present

## 2014-06-17 DIAGNOSIS — Z7951 Long term (current) use of inhaled steroids: Secondary | ICD-10-CM | POA: Diagnosis not present

## 2014-06-17 DIAGNOSIS — I129 Hypertensive chronic kidney disease with stage 1 through stage 4 chronic kidney disease, or unspecified chronic kidney disease: Secondary | ICD-10-CM | POA: Diagnosis present

## 2014-06-17 DIAGNOSIS — J189 Pneumonia, unspecified organism: Secondary | ICD-10-CM | POA: Diagnosis present

## 2014-06-17 DIAGNOSIS — R634 Abnormal weight loss: Secondary | ICD-10-CM | POA: Diagnosis present

## 2014-06-17 DIAGNOSIS — I10 Essential (primary) hypertension: Secondary | ICD-10-CM

## 2014-06-17 DIAGNOSIS — K5900 Constipation, unspecified: Secondary | ICD-10-CM | POA: Diagnosis not present

## 2014-06-17 DIAGNOSIS — Z87891 Personal history of nicotine dependence: Secondary | ICD-10-CM | POA: Diagnosis not present

## 2014-06-17 DIAGNOSIS — I5020 Unspecified systolic (congestive) heart failure: Secondary | ICD-10-CM | POA: Diagnosis not present

## 2014-06-17 DIAGNOSIS — Y95 Nosocomial condition: Secondary | ICD-10-CM | POA: Diagnosis present

## 2014-06-17 DIAGNOSIS — M6281 Muscle weakness (generalized): Secondary | ICD-10-CM | POA: Diagnosis not present

## 2014-06-17 DIAGNOSIS — R531 Weakness: Secondary | ICD-10-CM | POA: Diagnosis not present

## 2014-06-17 DIAGNOSIS — K219 Gastro-esophageal reflux disease without esophagitis: Secondary | ICD-10-CM | POA: Diagnosis not present

## 2014-06-17 DIAGNOSIS — I872 Venous insufficiency (chronic) (peripheral): Secondary | ICD-10-CM | POA: Diagnosis present

## 2014-06-17 DIAGNOSIS — I2510 Atherosclerotic heart disease of native coronary artery without angina pectoris: Secondary | ICD-10-CM | POA: Diagnosis present

## 2014-06-17 DIAGNOSIS — I5032 Chronic diastolic (congestive) heart failure: Secondary | ICD-10-CM | POA: Diagnosis present

## 2014-06-17 DIAGNOSIS — L89629 Pressure ulcer of left heel, unspecified stage: Secondary | ICD-10-CM | POA: Diagnosis present

## 2014-06-17 DIAGNOSIS — M5136 Other intervertebral disc degeneration, lumbar region: Secondary | ICD-10-CM | POA: Diagnosis not present

## 2014-06-17 DIAGNOSIS — M47896 Other spondylosis, lumbar region: Secondary | ICD-10-CM | POA: Diagnosis not present

## 2014-06-17 DIAGNOSIS — R627 Adult failure to thrive: Secondary | ICD-10-CM | POA: Diagnosis present

## 2014-06-17 DIAGNOSIS — Z9119 Patient's noncompliance with other medical treatment and regimen: Secondary | ICD-10-CM | POA: Diagnosis not present

## 2014-06-17 DIAGNOSIS — J309 Allergic rhinitis, unspecified: Secondary | ICD-10-CM | POA: Diagnosis not present

## 2014-06-17 DIAGNOSIS — G4700 Insomnia, unspecified: Secondary | ICD-10-CM | POA: Diagnosis not present

## 2014-06-17 DIAGNOSIS — R262 Difficulty in walking, not elsewhere classified: Secondary | ICD-10-CM | POA: Diagnosis not present

## 2014-06-17 DIAGNOSIS — R279 Unspecified lack of coordination: Secondary | ICD-10-CM | POA: Diagnosis not present

## 2014-06-17 DIAGNOSIS — L03116 Cellulitis of left lower limb: Secondary | ICD-10-CM | POA: Diagnosis present

## 2014-06-17 DIAGNOSIS — G4730 Sleep apnea, unspecified: Secondary | ICD-10-CM | POA: Diagnosis not present

## 2014-06-17 DIAGNOSIS — Z79899 Other long term (current) drug therapy: Secondary | ICD-10-CM | POA: Diagnosis not present

## 2014-06-17 DIAGNOSIS — R600 Localized edema: Secondary | ICD-10-CM | POA: Insufficient documentation

## 2014-06-17 DIAGNOSIS — M5126 Other intervertebral disc displacement, lumbar region: Secondary | ICD-10-CM | POA: Diagnosis not present

## 2014-06-17 DIAGNOSIS — C801 Malignant (primary) neoplasm, unspecified: Secondary | ICD-10-CM | POA: Diagnosis not present

## 2014-06-17 DIAGNOSIS — E119 Type 2 diabetes mellitus without complications: Secondary | ICD-10-CM | POA: Diagnosis not present

## 2014-06-17 DIAGNOSIS — D72829 Elevated white blood cell count, unspecified: Secondary | ICD-10-CM | POA: Diagnosis not present

## 2014-06-17 DIAGNOSIS — E1140 Type 2 diabetes mellitus with diabetic neuropathy, unspecified: Secondary | ICD-10-CM | POA: Diagnosis present

## 2014-06-17 DIAGNOSIS — R161 Splenomegaly, not elsewhere classified: Secondary | ICD-10-CM | POA: Diagnosis not present

## 2014-06-17 LAB — BASIC METABOLIC PANEL
ANION GAP: 5 (ref 5–15)
BUN: 30 mg/dL — AB (ref 6–23)
CALCIUM: 9.5 mg/dL (ref 8.4–10.5)
CO2: 24 mmol/L (ref 19–32)
CREATININE: 1.22 mg/dL (ref 0.50–1.35)
Chloride: 109 mmol/L (ref 96–112)
GFR calc non Af Amer: 57 mL/min — ABNORMAL LOW (ref >90)
GFR, EST AFRICAN AMERICAN: 67 mL/min — AB (ref >90)
Glucose, Bld: 118 mg/dL — ABNORMAL HIGH (ref 70–99)
POTASSIUM: 4.4 mmol/L (ref 3.5–5.1)
Sodium: 138 mmol/L (ref 135–145)

## 2014-06-17 LAB — CBC
HCT: 25.7 % — ABNORMAL LOW (ref 39.0–52.0)
Hemoglobin: 8.1 g/dL — ABNORMAL LOW (ref 13.0–17.0)
MCH: 26 pg (ref 26.0–34.0)
MCHC: 31.5 g/dL (ref 30.0–36.0)
MCV: 82.4 fL (ref 78.0–100.0)
PLATELETS: 319 10*3/uL (ref 150–400)
RBC: 3.12 MIL/uL — AB (ref 4.22–5.81)
RDW: 20.3 % — ABNORMAL HIGH (ref 11.5–15.5)
WBC: 20.1 10*3/uL — AB (ref 4.0–10.5)

## 2014-06-17 LAB — GLUCOSE, CAPILLARY
GLUCOSE-CAPILLARY: 94 mg/dL (ref 70–99)
GLUCOSE-CAPILLARY: 95 mg/dL (ref 70–99)
GLUCOSE-CAPILLARY: 80 mg/dL (ref 70–99)
Glucose-Capillary: 83 mg/dL (ref 70–99)

## 2014-06-17 MED ORDER — METOPROLOL TARTRATE 25 MG PO TABS
25.0000 mg | ORAL_TABLET | Freq: Two times a day (BID) | ORAL | Status: DC
  Administered 2014-06-17 – 2014-06-25 (×16): 25 mg via ORAL
  Filled 2014-06-17 (×21): qty 1

## 2014-06-17 MED ORDER — ZOLPIDEM TARTRATE 5 MG PO TABS
5.0000 mg | ORAL_TABLET | Freq: Every day | ORAL | Status: DC
  Administered 2014-06-17 – 2014-06-24 (×8): 5 mg via ORAL
  Filled 2014-06-17 (×8): qty 1

## 2014-06-17 MED ORDER — SODIUM CHLORIDE 0.9 % IJ SOLN: 3.0000 mL | INTRAMUSCULAR | Status: DC | PRN

## 2014-06-17 MED ORDER — POTASSIUM CHLORIDE CRYS ER 20 MEQ PO TBCR
20.0000 meq | EXTENDED_RELEASE_TABLET | Freq: Every day | ORAL | Status: DC
  Administered 2014-06-17 – 2014-06-25 (×9): 20 meq via ORAL
  Filled 2014-06-17 (×11): qty 1

## 2014-06-17 MED ORDER — INSULIN ASPART 100 UNIT/ML ~~LOC~~ SOLN: 0.0000 [IU] | Freq: Every day | SUBCUTANEOUS | Status: DC

## 2014-06-17 MED ORDER — ONDANSETRON HCL 4 MG PO TABS: 4.0000 mg | ORAL_TABLET | Freq: Four times a day (QID) | ORAL | Status: DC | PRN

## 2014-06-17 MED ORDER — INSULIN ASPART 100 UNIT/ML ~~LOC~~ SOLN: 0.0000 [IU] | Freq: Three times a day (TID) | SUBCUTANEOUS | Status: DC

## 2014-06-17 MED ORDER — SODIUM CHLORIDE 0.9 % IV SOLN: 250.0000 mL | INTRAVENOUS | Status: DC | PRN

## 2014-06-17 MED ORDER — GLUCERNA SHAKE PO LIQD
237.0000 mL | Freq: Two times a day (BID) | ORAL | Status: DC
  Administered 2014-06-17 – 2014-06-24 (×14): 237 mL via ORAL

## 2014-06-17 MED ORDER — FOLIC ACID 1 MG PO TABS
1.0000 mg | ORAL_TABLET | Freq: Every day | ORAL | Status: DC
  Administered 2014-06-17 – 2014-06-25 (×9): 1 mg via ORAL
  Filled 2014-06-17 (×10): qty 1

## 2014-06-17 MED ORDER — ACETAMINOPHEN 650 MG RE SUPP: 650.0000 mg | Freq: Four times a day (QID) | RECTAL | Status: DC | PRN

## 2014-06-17 MED ORDER — FERROUS SULFATE DRIED 200 (65 FE) MG PO TABS: 1.0000 | ORAL_TABLET | Freq: Every day | ORAL | Status: DC

## 2014-06-17 MED ORDER — OXYCODONE HCL 5 MG PO TABS
5.0000 mg | ORAL_TABLET | ORAL | Status: DC | PRN
  Administered 2014-06-18: 5 mg via ORAL
  Filled 2014-06-17: qty 1

## 2014-06-17 MED ORDER — ALUM & MAG HYDROXIDE-SIMETH 200-200-20 MG/5ML PO SUSP
30.0000 mL | Freq: Four times a day (QID) | ORAL | Status: DC | PRN
  Administered 2014-06-21: 30 mL via ORAL
  Filled 2014-06-17: qty 30

## 2014-06-17 MED ORDER — FERROUS SULFATE 325 (65 FE) MG PO TABS
325.0000 mg | ORAL_TABLET | Freq: Every day | ORAL | Status: DC
  Administered 2014-06-17 – 2014-06-21 (×5): 325 mg via ORAL
  Filled 2014-06-17 (×6): qty 1

## 2014-06-17 MED ORDER — SALINE SPRAY 0.65 % NA SOLN
1.0000 | NASAL | Status: DC | PRN
  Administered 2014-06-25: 2 via NASAL
  Filled 2014-06-17: qty 44

## 2014-06-17 MED ORDER — SODIUM CHLORIDE 0.9 % IJ SOLN
3.0000 mL | Freq: Two times a day (BID) | INTRAMUSCULAR | Status: DC
  Administered 2014-06-17 – 2014-06-24 (×7): 3 mL via INTRAVENOUS

## 2014-06-17 MED ORDER — B COMPLEX VITAMINS PO CAPS
1.0000 | ORAL_CAPSULE | Freq: Every day | ORAL | Status: DC
  Administered 2014-06-17 – 2014-06-25 (×9): 1 via ORAL
  Filled 2014-06-17 (×9): qty 1

## 2014-06-17 MED ORDER — ASPIRIN 325 MG PO TABS
325.0000 mg | ORAL_TABLET | Freq: Every day | ORAL | Status: DC
  Administered 2014-06-17 – 2014-06-25 (×9): 325 mg via ORAL
  Filled 2014-06-17 (×10): qty 1

## 2014-06-17 MED ORDER — PANTOPRAZOLE SODIUM 40 MG PO TBEC
40.0000 mg | DELAYED_RELEASE_TABLET | Freq: Every day | ORAL | Status: DC
  Administered 2014-06-17 – 2014-06-25 (×9): 40 mg via ORAL
  Filled 2014-06-17 (×8): qty 1

## 2014-06-17 MED ORDER — ENOXAPARIN SODIUM 40 MG/0.4ML ~~LOC~~ SOLN
40.0000 mg | SUBCUTANEOUS | Status: DC
  Administered 2014-06-17 – 2014-06-25 (×9): 40 mg via SUBCUTANEOUS
  Filled 2014-06-17 (×9): qty 0.4

## 2014-06-17 MED ORDER — ENOXAPARIN SODIUM 30 MG/0.3ML ~~LOC~~ SOLN
30.0000 mg | SUBCUTANEOUS | Status: DC
  Filled 2014-06-17: qty 0.3

## 2014-06-17 MED ORDER — ACETAMINOPHEN 325 MG PO TABS
650.0000 mg | ORAL_TABLET | Freq: Four times a day (QID) | ORAL | Status: DC | PRN
  Administered 2014-06-24: 650 mg via ORAL
  Filled 2014-06-17: qty 2

## 2014-06-17 MED ORDER — GABAPENTIN 100 MG PO CAPS
100.0000 mg | ORAL_CAPSULE | Freq: Every day | ORAL | Status: DC
  Administered 2014-06-17: 100 mg via ORAL
  Filled 2014-06-17 (×2): qty 1

## 2014-06-17 MED ORDER — VANCOMYCIN HCL IN DEXTROSE 750-5 MG/150ML-% IV SOLN
750.0000 mg | Freq: Two times a day (BID) | INTRAVENOUS | Status: DC
  Administered 2014-06-17 – 2014-06-20 (×7): 750 mg via INTRAVENOUS
  Filled 2014-06-17 (×12): qty 150

## 2014-06-17 MED ORDER — FUROSEMIDE 40 MG PO TABS
40.0000 mg | ORAL_TABLET | Freq: Two times a day (BID) | ORAL | Status: DC
  Administered 2014-06-17 – 2014-06-25 (×17): 40 mg via ORAL
  Filled 2014-06-17 (×21): qty 1

## 2014-06-17 MED ORDER — VANCOMYCIN HCL 10 G IV SOLR
2000.0000 mg | Freq: Once | INTRAVENOUS | Status: AC
  Administered 2014-06-17: 2000 mg via INTRAVENOUS
  Filled 2014-06-17: qty 2000

## 2014-06-17 MED ORDER — ONDANSETRON HCL 4 MG/2ML IJ SOLN
4.0000 mg | Freq: Four times a day (QID) | INTRAMUSCULAR | Status: DC | PRN
  Administered 2014-06-21 – 2014-06-24 (×2): 4 mg via INTRAVENOUS
  Filled 2014-06-17 (×2): qty 2

## 2014-06-17 MED ORDER — FLUTICASONE PROPIONATE 50 MCG/ACT NA SUSP
2.0000 | Freq: Every day | NASAL | Status: DC
  Administered 2014-06-17 – 2014-06-25 (×8): 2 via NASAL
  Filled 2014-06-17: qty 16

## 2014-06-17 MED ORDER — DEXTROSE 5 % IV SOLN
1.0000 g | Freq: Two times a day (BID) | INTRAVENOUS | Status: DC
  Administered 2014-06-17 – 2014-06-21 (×9): 1 g via INTRAVENOUS
  Filled 2014-06-17 (×10): qty 1

## 2014-06-17 MED ORDER — DEXTROSE 5 % IV SOLN
1.0000 g | Freq: Once | INTRAVENOUS | Status: AC
  Administered 2014-06-17: 1 g via INTRAVENOUS
  Filled 2014-06-17: qty 1

## 2014-06-17 MED ORDER — HYDROMORPHONE HCL 1 MG/ML IJ SOLN
0.5000 mg | INTRAMUSCULAR | Status: DC | PRN
  Administered 2014-06-18: 1 mg via INTRAVENOUS
  Filled 2014-06-17: qty 1

## 2014-06-17 NOTE — Evaluation (Signed)
Physical Therapy Evaluation Patient Details Name: Juan [name] MRN: [id number] DOB: Jul 27, 1940 Today's Date: 06/17/2014   History of Present Illness  74 y.o. male with a history of CAD, Diastolic CHF, AML,  HTN,  DM2 who presents to the ED with complaints of worsening BLE edema and difficulty walking as well as weakness and malaise and SOB.    Clinical Impression  Pt adm due to above. Pt recently D/C from SNF but reports he has been bed bound for ~3 months due to weakness and bil LE edema. Pt able to stand with 2 person (A) this session and ambulate to [facility]. Pt unsafe to D/C home and will benefit from SNF for post acute rehab. Pt denied any dizziness; Orthostatics as follows; supine 120/76; 123/60 and standing 154/74. Pt to benefit from skilled acute PT to address deficits and maximize functional mobility.     Follow Up Recommendations SNF;Supervision/Assistance - 24 hour    Equipment Recommendations  Other (comment) (TBD)    Recommendations for Other Services OT consult     Precautions / Restrictions Precautions Precautions: Fall Restrictions Weight Bearing Restrictions: No      Mobility  Bed Mobility Overal bed mobility: Needs Assistance Bed Mobility: Supine to Sit;Sit to Supine;Rolling Rolling: Min assist   Supine to sit: Mod assist;HOB elevated Sit to supine: Mod assist   General bed mobility comments: cues for hand placement; pt relying heavily on handrails; mod (A) to bring LEs to/off EOB; pt limited due to pain and generalized LE weakness  Transfers Overall transfer level: Needs assistance Equipment used: Rolling walker (2 wheeled) Transfers: Sit to/from Stand Sit to Stand: +2 physical assistance;Mod assist;From elevated surface         General transfer comment: (A) to power up and maintain balance; pt with heavy lean posteriorly; max multimodal cues for safety   Ambulation/Gait Ambulation/Gait assistance: +2 physical assistance;Mod assist Ambulation Distance  (Feet): 5 Feet (lateral steps to [facility]) Assistive device: Rolling walker (2 wheeled)       General Gait Details: lateral steps to [facility]; 2 person (A) for mobility   Stairs            Wheelchair Mobility    Modified Rankin (Stroke Patients Only)       Balance Overall balance assessment: Needs assistance Sitting-balance support: Feet supported;No upper extremity supported Sitting balance-Leahy Scale: Fair Sitting balance - Comments: denied any dizziness Postural control: Posterior lean Standing balance support: During functional activity;Bilateral upper extremity supported Standing balance-Leahy Scale: Zero Standing balance comment: 2 person (A) and RW; heavy lean posteriorly                              Pertinent Vitals/Pain Pain Assessment: 0-10 Pain Score: 5  Pain Location: bil LEs  Pain Descriptors / Indicators: Aching Pain Intervention(s): Monitored during session;Premedicated before session;Repositioned    Home Living Family/patient expects to be discharged to:: Skilled nursing facility Living Arrangements: Children Available Help at Discharge: Family;Available 24 hours/day Type of Home: Apartment Home Access: Level entry     Home Layout: One level Home Equipment: Cane - single point Additional Comments: lives with son     Prior Function Level of Independence: Needs assistance   Gait / Transfers Assistance Needed: pt reports he has not been ambulatory for ~ 3 months  ADL's / Homemaking Assistance Needed: total (A) since D/C from hospital         Hand Dominance   Dominant Hand: Right  Extremity/Trunk Assessment   Upper Extremity Assessment: Defer to OT evaluation           Lower Extremity Assessment: RLE deficits/detail;LLE deficits/detail RLE Deficits / Details: grossly 3-/5 LLE Deficits / Details: grossly 3-/5  Cervical / Trunk Assessment: Normal  Communication   Communication: No difficulties  Cognition Arousal/Alertness:  Awake/alert Behavior During Therapy: WFL for tasks assessed/performed Overall Cognitive Status: No family/caregiver present to determine baseline cognitive functioning       Memory: Decreased short-term memory              General Comments General comments (skin integrity, edema, etc.): heels floated; at risk of skin breakdown; reports incontinence     Exercises General Exercises - Lower Extremity Ankle Circles/Pumps: AROM;Both;10 reps;Supine Long Arc Quad: AROM;Both;5 reps Heel Slides: AAROM;Both;5 reps;Supine      Assessment/Plan    PT Assessment Patient needs continued PT services  PT Diagnosis Difficulty walking;Generalized weakness;Acute pain   PT Problem List Decreased strength;Decreased activity tolerance;Decreased balance;Decreased mobility;Decreased cognition;Decreased knowledge of use of DME;Decreased safety awareness;Pain  PT Treatment Interventions DME instruction;Gait training;Functional mobility training;Therapeutic activities;Therapeutic exercise;Balance training;Neuromuscular re-education;Patient/family education   PT Goals (Current goals can be found in the Care Plan section) Acute Rehab PT Goals Patient Stated Goal: to go home PT Goal Formulation: With patient Time For Goal Achievement: 06/23/14 Potential to Achieve Goals: Good    Frequency Min 3X/week   Barriers to discharge Decreased caregiver support      Co-evaluation               End of Session Equipment Utilized During Treatment: Gait belt Activity Tolerance: Patient limited by pain;Patient limited by fatigue Patient left: in bed;with call [place]/phone within reach;with bed alarm set Nurse Communication: Mobility status;Precautions    Functional Assessment Tool Used: clinical judgement Functional Limitation: Mobility: Walking and moving around Mobility: Walking and Moving Around Current Status (F7494): At least 60 percent but less than 80 percent impaired, limited or  restricted Mobility: Walking and Moving Around Goal Status [phone]): At least 1 percent but less than 20 percent impaired, limited or restricted    Time: 9163-8466 PT Time Calculation (min) (ACUTE ONLY): 24 min   Charges:   PT Evaluation $Initial PT Evaluation Tier I: 1 Procedure PT Treatments $Gait Training: 8-22 mins   PT G Codes:   PT G-Codes **NOT FOR INPATIENT CLASS** Functional Assessment Tool Used: clinical judgement Functional Limitation: Mobility: Walking and moving around Mobility: Walking and Moving Around Current Status (Z9935): At least 60 percent but less than 80 percent impaired, limited or restricted Mobility: Walking and Moving Around Goal Status [phone]): At least 1 percent but less than 20 percent impaired, limited or restricted    Gustavus Bryant, [place] 06/17/2014, 12:26 PM

## 2014-06-17 NOTE — Progress Notes (Signed)
Utilization Review Completed.   Lesean Woolverton, RN, BSN Nurse Case Manager  

## 2014-06-17 NOTE — Progress Notes (Signed)
ANTIBIOTIC CONSULT NOTE - INITIAL  Pharmacy Consult for vancomycin Indication: rule out pneumonia  No Known Allergies  Patient Measurements: Height: 6\' 5"  (195.6 cm) Weight: 229 lb 0.9 oz (103.9 kg) IBW/kg (Calculated) : 89.1  Vital Signs: Temp: 98.9 F (37.2 C) (01/30 1956) Temp Source: Rectal (01/30 1956) BP: 119/62 mmHg (01/31 0100) Pulse Rate: 81 (01/31 0100)  Labs:  Recent Labs  06/16/14 1940  WBC 21.0*  HGB 8.8*  PLT 323  CREATININE 1.37*   Estimated Creatinine Clearance: 61.4 mL/min (by C-G formula based on Cr of 1.37).   Medical History: Past Medical History  Diagnosis Date  . CAD (coronary artery disease)     dx elsewheer in past, no documentation. Non-ischemic myovue 2007  . CHF (congestive heart failure)     dx elsewhere, no documentation. Normal EF by previous echo. Trival AS needs f/u ECHO by 2012  . Hypertension   . Sleep apnea, obstructive     at some point used CPAP, was d/c  years ago  . Anemia   . History of thrombocytosis   . Allergic rhinitis   . Edema     R>L leg, u/s 5-12 neg for DVT  . Hemorrhoid   . Type II diabetes mellitus   . Sinus congestion   . Migraine     "once/wk at least" (07/11/2013)  . Shortness of breath   . Myeloproliferative disease   . Ascending aortic aneurysm 03/2014    4.3cm on CT scan     Assessment: 74yo male presents w/ LE pain/swelling, recently dx'd w/ HF, CXR shows patchy opacities, to start IV ABX for possible PNA.  Goal of Therapy:  Vancomycin trough level 15-20 mcg/ml  Plan:  Will give vancomycin 2000mg  IV x1 followed by 750mg  IV Q12H and monitor CBC, Cx, levels prn.  Wynona Neat, PharmD, BCPS  06/17/2014,1:25 AM

## 2014-06-17 NOTE — H&P (Addendum)
[facility] Admission History and Physical       Juan [name]:[id number] DOB: [birth year] DOA: 06/16/2014  Referring physician: EDP PCP: Juan November, MD  Specialists:   Chief Complaint: Weakness  HPI: Juan [name] is a 74 y.o. male with a history of CAD, Diastolic CHF, AML,  HTN,  DM2 who presents to the ED with complaints of worsening BLE edema and difficulty walking as well as weakness and malaise and SOB.    He denies any fever or chills, or cough.   He reports having a decreased appetite.   He was recently discharged from an Area SNF.   He was evaluated in the ED and was found to have a patchy central and bibasilar Airspace disease on chest X-ray , and he was placed on antibiotic coverage for HCAP and referred for admission.      Review of Systems:  Constitutional: No Weight Loss, No Weight Gain, Night Sweats, Fevers, Chills, Dizziness, Fatigue, +Generalized Weakness HEENT: No Headaches, Difficulty Swallowing,Tooth/Dental Problems,Sore Throat,  No Sneezing, Rhinitis, Ear Ache, Nasal Congestion, or Post Nasal Drip,  Cardio-vascular:  No Chest pain, Orthopnea, PND, Edema in Lower Extremities, Anasarca, Dizziness, Palpitations  Resp: +Dyspnea, No DOE, No Productive Cough, No Non-Productive Cough, No Hemoptysis, No Wheezing.    GI: No Heartburn, Indigestion, Abdominal Pain, Nausea, Vomiting, Diarrhea, Hematemesis, Hematochezia, Melena, Change in Bowel Habits,  +Loss of Appetite  GU: No Dysuria, Change in Color of Urine, No Urgency or Frequency, No Flank pain.  Musculoskeletal: No Joint Pain or Swelling, No Decreased Range of Motion, No Back Pain.  Neurologic: No Syncope, No Seizures, Muscle Weakness, Paresthesia, Vision Disturbance or Loss, No Diplopia, No Vertigo, + Difficulty Walking,  Skin: No Rash or Lesions. Psych: No Change in Mood or Affect, No Depression or Anxiety, No Memory loss, No Confusion, or Hallucinations   Past Medical History  Diagnosis Date  . CAD (coronary  artery disease)     dx elsewheer in past, no documentation. Non-ischemic myovue 2007  . CHF (congestive heart failure)     dx elsewhere, no documentation. Normal EF by previous echo. Trival AS needs f/u ECHO by 2012  . Hypertension   . Sleep apnea, obstructive     at some point used CPAP, was d/c  years ago  . Anemia   . History of thrombocytosis   . Allergic rhinitis   . Edema     R>L leg, u/s 5-12 neg for DVT  . Hemorrhoid   . Type II diabetes mellitus   . Sinus congestion   . Migraine     "once/wk at least" (07/11/2013)  . Shortness of breath   . Myeloproliferative disease   . Ascending aortic aneurysm 03/2014    4.3cm on CT scan      Past Surgical History  Procedure Laterality Date  . Toe surgery Right     "tried to straighten out big toe" (07/11/2013)       Prior to Admission medications   Medication Sig Start Date End Date Taking? Authorizing Provider  acetaminophen (TYLENOL) 500 MG tablet Take 500-1,000 mg by mouth every 6 (six) hours as needed for moderate pain or headache.    Yes Historical Provider, MD  aspirin 325 MG tablet Take 1 tablet (325 mg total) by mouth daily. 04/16/14  Yes Ripudeep K Rai, MD  b complex vitamins capsule Take 1 capsule by mouth daily. 01/12/13  Yes Irene Pap, NP  feeding supplement, GLUCERNA SHAKE, (GLUCERNA SHAKE) LIQD Take 237 mLs by  mouth 2 (two) times daily between meals. 04/10/14  Yes Barton Dubois, MD  Ferrous Sulfate Dried 200 (65 FE) MG TABS Take 1 tablet by mouth daily.    Yes Historical Provider, MD  fluticasone (FLONASE) 50 MCG/ACT nasal spray Place 2 sprays into both nostrils daily.    Yes Historical Provider, MD  folic acid (FOLVITE) 1 MG tablet Take 1 mg by mouth daily.    Yes Historical Provider, MD  furosemide (LASIX) 40 MG tablet Take 1 tablet (40 mg total) by mouth 2 (two) times daily. 04/10/14  Yes Barton Dubois, MD  gabapentin (NEURONTIN) 100 MG capsule Take 1 capsule (100 mg total) by mouth at bedtime. 04/10/14  Yes  Barton Dubois, MD  hydrocerin (EUCERIN) CREA Apply Eucerin cream to BLE Q day after bathing and roughly towel drying to remove loose skin 01/27/14  Yes Shanker Kristeen Mans, MD  HYDROcodone-acetaminophen (NORCO/VICODIN) 5-325 MG per tablet Take 1 tablet by mouth every 6 (six) hours as needed for moderate pain. 04/16/14  Yes Ripudeep Krystal Eaton, MD  metoprolol tartrate (LOPRESSOR) 25 MG tablet Take 1 tablet (25 mg total) by mouth 2 (two) times daily. 04/16/14  Yes Ripudeep Krystal Eaton, MD  pantoprazole (PROTONIX) 40 MG tablet Take 1 tablet (40 mg total) by mouth daily. 04/10/14  Yes Barton Dubois, MD  potassium chloride SA (K-DUR,KLOR-CON) 20 MEQ tablet Take 20 mEq by mouth daily.   Yes Historical Provider, MD  sodium chloride (OCEAN) 0.65 % SOLN nasal spray Place 1-2 sprays into both nostrils every 4 (four) hours as needed for congestion. 04/10/14  Yes Barton Dubois, MD  zolpidem (AMBIEN) 5 MG tablet Take 1 tablet (5 mg total) by mouth at bedtime. 05/14/14  Yes Lauree Chandler, NP      No Known Allergies   Social History:  reports that he quit smoking about 48 years ago. His smoking use included Cigarettes. He has a 3 pack-year smoking history. He has never used smokeless tobacco. He reports that he does not drink alcohol or use illicit drugs.     Family History  Problem Relation Age of Onset  . Colon cancer Neg Hx   . Prostate cancer Neg Hx   . Heart attack Neg Hx   . Diabetes Neg Hx   . Schizophrenia Son        Physical Exam:  GEN:  Pleasant Elderly  74 y.o. African American  male examined and in no acute distress; cooperative with exam Filed Vitals:   06/16/14 2115 06/16/14 2130 06/16/14 2200 06/17/14 0100  BP: 116/55 116/67 122/58 119/62  Pulse: 80 83  81  Temp:      TempSrc:      Resp: 18 18 18 17   SpO2: 100% 100%  96%   Blood pressure 119/62, pulse 81, temperature 98.9 F (37.2 C), temperature source Rectal, resp. rate 17, SpO2 96 %. PSYCH: He is alert and oriented x4; does not  appear anxious does not appear depressed; affect is normal HEENT: Normocephalic and Atraumatic, Mucous membranes pink; PERRLA; EOM intact; Fundi:  Benign;  No scleral icterus, Nares: Patent, Oropharynx: Clear,     Neck:  FROM, No Cervical Lymphadenopathy nor Thyromegaly or Carotid Bruit; No JVD; Breasts:: Not examined CHEST WALL: No tenderness CHEST: Normal respiration, clear to auscultation bilaterally HEART: Regular rate and rhythm; no murmurs rubs or gallops BACK: No kyphosis or scoliosis; No CVA tenderness ABDOMEN: Positive Bowel Sounds, Soft Non-Tender; No Masses, No Organomegaly, No Pannus; No Intertriginous candida. Rectal Exam: Not done  EXTREMITIES: No Cyanosis, Clubbing, 3+ BLE Edema,  Hypopigmented and Hyperpigmented areas of lowerw BLEs,  +Venous Stasis Changes of BLEs    Genitalia: not examined PULSES: 2+ and symmetric SKIN: Normal hydration no rash or ulceration CNS:  Alert and Oriented x 4, No Focal Deficits , Generalized Weakness  Vascular: pulses palpable throughout    Labs on Admission:  Basic Metabolic Panel:  Recent Labs Lab 06/16/14 1940  NA 142  K 5.0  CL 111  CO2 20  GLUCOSE 105*  BUN 35*  CREATININE 1.37*  CALCIUM 10.0   Liver Function Tests:  Recent Labs Lab 06/16/14 1940  AST 22  ALT 11  ALKPHOS 97  BILITOT 0.4  PROT 6.6  ALBUMIN 3.3*   No results for input(s): LIPASE, AMYLASE in the last 168 hours. No results for input(s): AMMONIA in the last 168 hours. CBC:  Recent Labs Lab 06/16/14 1940  WBC 21.0*  NEUTROABS 14.7*  HGB 8.8*  HCT 28.6*  MCV 82.4  PLT 323   Cardiac Enzymes:  Recent Labs Lab 06/16/14 1940  TROPONINI <0.03    BNP (last 3 results)  Recent Labs  01/25/14 1115 03/29/14 1549 04/14/14 2335  PROBNP 828.2* 6628.0* 4369.0*   CBG: No results for input(s): GLUCAP in the last 168 hours.  Radiological Exams on Admission: Dg Chest 2 View  06/16/2014   CLINICAL DATA:  Lower extremity pain and swelling.  EXAM:  CHEST  2 VIEW  COMPARISON:  Prior radiographs dated 04/15/2014, 03/31/2014, 11 04/2014, 01/26/2014 and 12/09/2013  FINDINGS: There is unchanged right hemidiaphragm elevation. There is unchanged cardiomegaly and aortic tortuosity. Scattered airspace opacities are again evident in the central and basilar regions of both lungs, without significant interval change. Diffuse bony sclerosis is again evident and may relate to the described history of a myeloproliferative disease.  IMPRESSION: Multifocal patchy airspace opacities in the central and basilar regions bilaterally, present on numerous prior studies although it does appear to have been slowly worsening compared to older examinations. Chronic infectious, inflammatory or neoplastic processes may be considered. No convincing acute findings are evident.   Electronically Signed   By: Andreas Newport M.D.   On: 06/16/2014 22:51     EKG: Independently reviewed.    Assessment/Plan:   74 y.o. male with  Principal Problem:   1.   HCAP (healthcare-associated pneumonia)   IV Vancomycin and Cefepime   Albuterol Nebs   O2 PRN   Monitor O2 Sats   Active Problems:   2.   Chronic diastolic CHF (congestive heart failure)- BNP = 18.8, but has BLE Edema   Monitor for Signs of Fluid Overload   Check Albumin       3.   Type II diabetes mellitus   Diet Controlled   SSI Coverage PRN   Check HbA1c.        4.   Essential hypertension   Continue Lasix, Metoprolol Rx   Monitor BPs      5.   Myeloproliferative disease- Hx of AML   Chronic, see Oncology Dr [name]     6.   Chronic lower extremity edema, stasis dermatitis   Chronic on Lasix BID     7.   CKD (chronic kidney disease) stage 3, GFR 30-59 ml/min   Monitor BUN/Cr     8.   Weakness generalized- due to #1, and Deconditioning   PT consult       9.   DVT Prophylaxis    Lovenox  Code Status:    FULL CODE Family Communication:    No Family Present Disposition Plan:    Inpatient     Time spent:  [place] [facility] Pager [phone]   If [place] Please Contact the Day Rounding Team MD for [facility]  If 7PM-7AM, Please Contact Night-Floor Coverage  [email or web address] Password [email or web address] 06/17/2014, 1:14 AM

## 2014-06-17 NOTE — Progress Notes (Signed)
I have seen and examined Juan [name] at bedside and reviewed his chart.  Juan [name] is a pleasant 74 year old male admitted earlier this morning by Dr. Arnoldo Morale for HCAP/cellulitis of the right leg. Please refer to Dr. Arnoldo Morale' comprehensive assessment and care plan for details. Will continue current management.

## 2014-06-18 LAB — CBC
HEMATOCRIT: 26.3 % — AB (ref 39.0–52.0)
Hemoglobin: 8.1 g/dL — ABNORMAL LOW (ref 13.0–17.0)
MCH: 25.8 pg — AB (ref 26.0–34.0)
MCHC: 30.8 g/dL (ref 30.0–36.0)
MCV: 83.8 fL (ref 78.0–100.0)
Platelets: 284 10*3/uL (ref 150–400)
RBC: 3.14 MIL/uL — AB (ref 4.22–5.81)
RDW: 19.8 % — ABNORMAL HIGH (ref 11.5–15.5)
WBC: 17.1 10*3/uL — ABNORMAL HIGH (ref 4.0–10.5)

## 2014-06-18 LAB — GLUCOSE, CAPILLARY
Glucose-Capillary: 85 mg/dL (ref 70–99)
Glucose-Capillary: 104 mg/dL — ABNORMAL HIGH (ref 70–99)
Glucose-Capillary: 99 mg/dL (ref 70–99)
Glucose-Capillary: 101 mg/dL — ABNORMAL HIGH (ref 70–99)

## 2014-06-18 LAB — COMPREHENSIVE METABOLIC PANEL
ALK PHOS: 78 U/L (ref 39–117)
ALT: 8 U/L (ref 0–53)
AST: 21 U/L (ref 0–37)
Albumin: 2.7 g/dL — ABNORMAL LOW (ref 3.5–5.2)
Anion gap: 5 (ref 5–15)
BILIRUBIN TOTAL: 0.5 mg/dL (ref 0.3–1.2)
BUN: 24 mg/dL — AB (ref 6–23)
CHLORIDE: 109 mmol/L (ref 96–112)
CO2: 26 mmol/L (ref 19–32)
Calcium: 9.4 mg/dL (ref 8.4–10.5)
Creatinine, Ser: 1.18 mg/dL (ref 0.50–1.35)
GFR calc non Af Amer: 60 mL/min — ABNORMAL LOW (ref >90)
GFR, EST AFRICAN AMERICAN: 69 mL/min — AB (ref >90)
Glucose, Bld: 95 mg/dL (ref 70–99)
Potassium: 4.2 mmol/L (ref 3.5–5.1)
Sodium: 140 mmol/L (ref 135–145)
TOTAL PROTEIN: 5.8 g/dL — AB (ref 6.0–8.3)

## 2014-06-18 LAB — TSH: TSH: 3.063 u[IU]/mL (ref 0.350–4.500)

## 2014-06-18 MED ORDER — GUAIFENESIN ER 600 MG PO TB12
1200.0000 mg | ORAL_TABLET | Freq: Two times a day (BID) | ORAL | Status: DC
  Administered 2014-06-18 – 2014-06-24 (×14): 1200 mg via ORAL
  Filled 2014-06-18 (×19): qty 2

## 2014-06-18 MED ORDER — SENNOSIDES-DOCUSATE SODIUM 8.6-50 MG PO TABS
1.0000 | ORAL_TABLET | Freq: Every day | ORAL | Status: DC
  Administered 2014-06-19 – 2014-06-24 (×6): 1 via ORAL
  Filled 2014-06-18 (×6): qty 1

## 2014-06-18 MED ORDER — OXYCODONE-ACETAMINOPHEN 5-325 MG PO TABS
2.0000 | ORAL_TABLET | ORAL | Status: DC | PRN
  Administered 2014-06-19 – 2014-06-24 (×5): 2 via ORAL
  Filled 2014-06-18 (×5): qty 2

## 2014-06-18 MED ORDER — GABAPENTIN 100 MG PO CAPS
100.0000 mg | ORAL_CAPSULE | Freq: Three times a day (TID) | ORAL | Status: DC
  Administered 2014-06-18 – 2014-06-25 (×20): 100 mg via ORAL
  Filled 2014-06-18 (×25): qty 1

## 2014-06-18 NOTE — Clinical Social Work Psychosocial (Signed)
Clinical Social Work Department BRIEF PSYCHOSOCIAL ASSESSMENT 06/18/2014  Patient:  Juan [name], Juan [name]     Account Number:  [id number]     Admit date:  06/16/2014  Clinical Social Worker:  Wylene Men  Date/Time:  06/18/2014 11:43 AM  Referred by:  Physician  Date Referred:  06/18/2014 Referred for  SNF Placement  Psychosocial assessment   Other Referral:   none   Interview type:  Patient Other interview type:   none    PSYCHOSOCIAL DATA Living Status:  FAMILY Admitted from facility:   Level of care:   Primary support name:  marion Primary support relationship to patient:  CHILD, ADULT Degree of support available:   adequate    CURRENT CONCERNS Current Concerns  Post-Acute Placement   Other Concerns:   none    SOCIAL WORK ASSESSMENT / PLAN CSW assessed patient at bedside. Patient was alert and oriented at the time of the assessment.  Patient reports being from home with youngest son.  Patient states his eldest son also "pops in and out" on him as needed. Patient reports being admitted to the hospital for leukocytosis back in November 2015.  Patient reports being discharged from the hospital to [facility] where he received STR and was discharged home.  Patient reports being home for only a short while when his legs began to swell.  Patient was admitted to [facility] on 06/16/2014 with complaints of perpheral edema (bilateral legs).  Patient states he does not wish to return to "one of those facilities".  CSW asked permission to send clinical information to SNFs in order to obtain a safe discharge for patient (even as a back-up to being discharged home). Patient states "there is no reason to search for a facility".  CSW made referral to [facility] for possible home health.  Patient reports having home health set up after being discharged from [facility].  Patient does not recollect which agency he was set up with.  Patient is hopeful of regaining strength while in the hospital so he can  return home and be independent.  Patient reports his youngest son will be able to provide 24/7 supervision at time of discharge with his eldest son proving intermittent supervision.  RNCM aware.   Assessment/plan status:  No Further Intervention Required Other assessment/ plan:   none needed   Information/referral to community resources:   [facility]    PATIENT'S/FAMILY'S RESPONSE TO PLAN OF CARE: Patient was not agreeable to SNF search.  Patient requests to go home with home health services and reports having 24/7 supervision with youngest son.  RNCM aware.       Nonnie Done, [place] [phone]  Psychiatric & Orthopedics (5N 1-16) Clinical Social Worker

## 2014-06-18 NOTE — Progress Notes (Signed)
[facility] PROGRESS NOTE  Juan [name] ZLD:[id number] DOB: [birth year] DOA: 06/16/2014 PCP: [name], MD  Summary Juan [name] is a 74 y.o. male with a history of CAD, Diastolic CHF, AML, HTN, DM2, ?peripheral neuropathy who presented to the ED with complaints of worsening BLE edema/pain and difficulty walking as well as weakness and malaise and SOB after recent discharge from an Area SNF.He was found to have HCAP and cellulitis of the lower extremities more on the right side. Chest x-ray showed "Multifocal patchy airspace opacities in the central and basilar regions bilaterally, present on numerous prior studies although it does appear to have been slowly worsening compared to older examinations. Chronic infectious, inflammatory or neoplastic processes may be considered. No convincing acute findings are evident". Bilateral venous Doppler of the lower extremities was negative for DVT. It would appear that the lower extremity pains were related to cellulitis perhaps superimposed on peripheral neuropathy related to diabetes. Will therefore increase the dose of Neurontin and on just Percocet for better pain control. In terms of their healthcare associated pneumonia, white count has improved to 17,000, probably closer to the baseline as patient has myeloproliferative disorder with chronic leukocytosis. Will continue current antibiotics including vancomycin/cefepime per pharmacy pending septic workup. Patient is immunocompromised therefore will add on the side of treating infection aggressively. He should continue working with physical therapy. Plan HCAP (healthcare-associated pneumonia)/Cellulitis of leg, right/Weakness generalized/Pain in limb/Peripheral edema/Chronic lower extremity edema, stasis dermatitis/Unable to ambulate/Peripheral neuropathy  Follow septic workup. Check vitamin B12 level/RPR/TSH.  Continue antibiotics per pharmacy  Change Neurontin frequency to 3 times a day  Percocet as  needed for pain. Add Mucinex for cough.  Physical therapy Myeloproliferative disease  No acute changes Essential hypertension/Chronic diastolic CHF (congestive heart failure)/Type II diabetes mellitus/CKD (chronic kidney disease) stage 3, GFR 30-59 ml/min  No acute changes  DVT prophylaxis  Code Status: Full code Family Communication: Patient at bedside Disposition Plan: To be determined   Consultants:  None  Procedures:  None  Antibiotics:  Vancomycin 06/17/2014>  Cefepime 06/17/2014>  HPI/Subjective: Complains of leg pains. Says cough is better.  Objective: Filed Vitals:   06/18/14 1129  BP: 123/58  Pulse: 68  Temp:   Resp: 18    Intake/Output Summary (Last 24 hours) at 06/18/14 1551 Last data filed at 06/18/14 1145  Gross per 24 hour  Intake   1540 ml  Output   1300 ml  Net    240 ml   Filed Weights   06/17/14 0100 06/17/14 0145  Weight: 103.9 kg (229 lb 0.9 oz) 102.6 kg (226 lb 3.1 oz)    Exam:   General:  Lying comfortably in bed not in distress.  Cardiovascular: S1-S2 normal. No murmurs.  Respiratory: Good air entry bilaterally. No rhonchi or rails.  Abdomen: Soft and nontender.  Musculoskeletal: Chronic dermatitis changes both lower extremities, right leg warm to touch.   Data Reviewed: Basic Metabolic Panel:  Recent Labs Lab 06/16/14 1940 06/17/14 0435 06/18/14 0633  NA 142 138 140  K 5.0 4.4 4.2  CL 111 109 109  CO2 20 24 26   GLUCOSE 105* 118* 95  BUN 35* 30* 24*  CREATININE 1.37* 1.22 1.18  CALCIUM 10.0 9.5 9.4   Liver Function Tests:  Recent Labs Lab 06/16/14 1940 06/18/14 0633  AST 22 21  ALT 11 8  ALKPHOS 97 78  BILITOT 0.4 0.5  PROT 6.6 5.8*  ALBUMIN 3.3* 2.7*   No results for input(s): LIPASE, AMYLASE in the last 168  hours. No results for input(s): AMMONIA in the last 168 hours. CBC:  Recent Labs Lab 06/16/14 1940 06/17/14 0435 06/18/14 0633  WBC 21.0* 20.1* 17.1*  NEUTROABS 14.7*  --   --   HGB  8.8* 8.1* 8.1*  HCT 28.6* 25.7* 26.3*  MCV 82.4 82.4 83.8  PLT 323 319 284   Cardiac Enzymes:  Recent Labs Lab 06/16/14 1940  TROPONINI <0.03   BNP (last 3 results)  Recent Labs  06/16/14 1940  BNP 18.8    ProBNP (last 3 results)  Recent Labs  01/25/14 1115 03/29/14 1549 04/14/14 2335  PROBNP 828.2* 6628.0* 4369.0*    CBG:  Recent Labs Lab 06/17/14 0629 06/17/14 1226 06/17/14 2138 06/18/14 0606 06/18/14 1139  GLUCAP 95 83 80 85 104*    No results found for this or any previous visit (from the past 240 hour(s)).   Studies: Dg Chest 2 View  06/16/2014   CLINICAL DATA:  Lower extremity pain and swelling.  EXAM: CHEST  2 VIEW  COMPARISON:  Prior radiographs dated 04/15/2014, 03/31/2014, 11 04/2014, 01/26/2014 and 12/09/2013  FINDINGS: There is unchanged right hemidiaphragm elevation. There is unchanged cardiomegaly and aortic tortuosity. Scattered airspace opacities are again evident in the central and basilar regions of both lungs, without significant interval change. Diffuse bony sclerosis is again evident and may relate to the described history of a myeloproliferative disease.  IMPRESSION: Multifocal patchy airspace opacities in the central and basilar regions bilaterally, present on numerous prior studies although it does appear to have been slowly worsening compared to older examinations. Chronic infectious, inflammatory or neoplastic processes may be considered. No convincing acute findings are evident.   Electronically Signed   By: Andreas Newport M.D.   On: 06/16/2014 22:51    Scheduled Meds: . aspirin  325 mg Oral Daily  . b complex vitamins  1 capsule Oral Daily  . ceFEPime (MAXIPIME) IV  1 g Intravenous Q12H  . enoxaparin (LOVENOX) injection  40 mg Subcutaneous Q24H  . feeding supplement (GLUCERNA SHAKE)  237 mL Oral BID BM  . ferrous sulfate  325 mg Oral Q breakfast  . fluticasone  2 spray Each Nare Daily  . folic acid  1 mg Oral Daily  . furosemide   40 mg Oral BID  . gabapentin  100 mg Oral TID  . guaiFENesin  1,200 mg Oral BID  . insulin aspart  0-5 Units Subcutaneous QHS  . insulin aspart  0-9 Units Subcutaneous TID WC  . metoprolol tartrate  25 mg Oral BID  . pantoprazole  40 mg Oral Daily  . potassium chloride SA  20 mEq Oral Daily  . senna-docusate  1 tablet Oral QHS  . sodium chloride  3 mL Intravenous Q12H  . vancomycin  750 mg Intravenous Q12H  . zolpidem  5 mg Oral QHS   Continuous Infusions:    Time spent: 25 minutes.    Iain Sawchuk  [facility] Pager [phone]. If 7PM-7AM, please contact night-coverage at [email or web address], password [email or web address] 06/18/2014, 3:51 PM  LOS: 2 days

## 2014-06-19 LAB — GLUCOSE, CAPILLARY
GLUCOSE-CAPILLARY: 107 mg/dL — AB (ref 70–99)
GLUCOSE-CAPILLARY: 95 mg/dL (ref 70–99)
Glucose-Capillary: 98 mg/dL (ref 70–99)
Glucose-Capillary: 122 mg/dL — ABNORMAL HIGH (ref 70–99)

## 2014-06-19 LAB — VITAMIN B12: Vitamin B-12: 689 pg/mL (ref 211–911)

## 2014-06-19 LAB — RPR: RPR Ser Ql: NONREACTIVE

## 2014-06-19 MED ORDER — DICLOFENAC SODIUM 1 % TD GEL
2.0000 g | Freq: Four times a day (QID) | TRANSDERMAL | Status: DC | PRN
  Administered 2014-06-19 – 2014-06-25 (×12): 2 g via TOPICAL
  Filled 2014-06-19 (×2): qty 100

## 2014-06-19 NOTE — Progress Notes (Signed)
ANTIBIOTIC CONSULT NOTE   Pharmacy Consult for vancomycin Indication: rule out pneumonia  No Known Allergies  Patient Measurements: Height: 6\' 5"  (195.6 cm) Weight: 226 lb 3.1 oz (102.6 kg) IBW/kg (Calculated) : 89.1  Vital Signs: Temp: 98.7 F (37.1 C) (02/02 0600) BP: 114/56 mmHg (02/02 0600) Pulse Rate: 79 (02/02 0600)  Labs:  Recent Labs  06/16/14 1940 06/17/14 0435 06/18/14 0633  WBC 21.0* 20.1* 17.1*  HGB 8.8* 8.1* 8.1*  PLT 323 319 284  CREATININE 1.37* 1.22 1.18   Estimated Creatinine Clearance: 71.3 mL/min (by C-G formula based on Cr of 1.18).   Medical History: Past Medical History  Diagnosis Date  . CAD (coronary artery disease)     dx elsewheer in past, no documentation. Non-ischemic myovue 2007  . CHF (congestive heart failure)     dx elsewhere, no documentation. Normal EF by previous echo. Trival AS needs f/u ECHO by 2012  . Hypertension   . Sleep apnea, obstructive     at some point used CPAP, was d/c  years ago  . Anemia   . History of thrombocytosis   . Allergic rhinitis   . Edema     R>L leg, u/s 5-12 neg for DVT  . Hemorrhoid   . Type II diabetes mellitus   . Sinus congestion   . Migraine     "once/wk at least" (07/11/2013)  . Shortness of breath   . Myeloproliferative disease   . Ascending aortic aneurysm 03/2014    4.3cm on CT scan     Assessment: 74yo male presents w/ LE pain/swelling, recently dx'd w/ HF, CXR shows patchy opacities, to start IV ABX for possible PNA. His renal function remains stable  Goal of Therapy:  Vancomycin trough level 15-20 mcg/ml  Plan:  Cont vanc 750mg  IV Q12H Monitor renal function, cultures and clinical course  Thanks for allowing pharmacy to be a part of this patient's care.  Excell Seltzer, PharmD Clinical Pharmacist, [phone] 06/19/2014,1:00 PM

## 2014-06-19 NOTE — Progress Notes (Signed)
Physical Therapy Treatment Patient Details Name: Juan [name] MRN: [id number] DOB: [birth year] Today's Date: 06/19/2014    History of Present Illness 74 y.o. male with a history of CAD, Diastolic CHF, AML,  HTN,  DM2 who presents to the ED with complaints of worsening BLE edema and difficulty walking as well as weakness and malaise and SOB.  Pt dx with HCAP and cellulitis of right leg.    PT Comments    Pt is progressing well with his mobility.  When asked if his preference was to go home or back to rehab he did not give a clear answer.  It may be best to talk to pt's son as I am not so sure of his cognitive status.  He was able to walk into the hallway today with RW and second person for chair to follow.  He continues to be appropriate for SNF level rehab at discharge.  PT will follow acutely.   Follow Up Recommendations  SNF;Supervision/Assistance - 24 hour     Equipment Recommendations  Rolling walker with 5" wheels    Recommendations for Other Services OT consult     Precautions / Restrictions Precautions Precautions: Fall    Mobility  Bed Mobility Overal bed mobility: Needs Assistance Bed Mobility: Supine to Sit     Supine to sit: Supervision;HOB elevated     General bed mobility comments: Pt was able to get himself to EOB with HOB mildly elevated and use of bil arms on bed rail.    Transfers Overall transfer level: Needs assistance Equipment used: Rolling walker (2 wheeled) Transfers: Sit to/from Stand Sit to Stand: +2 physical assistance;Mod assist;From elevated surface         General transfer comment: Two person mod assist from elelvated bed.  Verbal cues for safe hand placement.  Verbal cues once standing for upright posture.   Ambulation/Gait Ambulation/Gait assistance: Min assist;+2 safety/equipment Ambulation Distance (Feet): 80 Feet Assistive device: Rolling walker (2 wheeled) Gait Pattern/deviations: Step-through pattern;Trunk flexed;Shuffle Gait  velocity: decreased Gait velocity interpretation: Below normal speed for age/gender General Gait Details: Pt, once up, was one person min assist to support trunk over weak legs and assist in steering RW.  Second person for safety and chair to follow to encourage increased gait distance.           Balance Overall balance assessment: Needs assistance Sitting-balance support: Feet supported;No upper extremity supported Sitting balance-Leahy Scale: Good     Standing balance support: Bilateral upper extremity supported Standing balance-Leahy Scale: Poor                      Cognition Arousal/Alertness: Awake/alert Behavior During Therapy: WFL for tasks assessed/performed Overall Cognitive Status: No family/caregiver present to determine baseline cognitive functioning                      Exercises Total Joint Exercises Towel Squeeze: AROM;Both;10 reps;Supine Short Arc Quad: AROM;Both;10 reps;Supine Hip ABduction/ADduction: AROM;Both;10 reps;Supine Straight Leg Raises: AROM;Both;10 reps;Supine General Exercises - Lower Extremity Ankle Circles/Pumps: AROM;Both;10 reps;Supine        Pertinent Vitals/Pain Pain Assessment: Faces Faces Pain Scale: Hurts a little bit Pain Location: bil feet Pain Descriptors / Indicators: Aching;Burning Pain Intervention(s): Limited activity within patient's tolerance;Monitored during session;Repositioned;Other (comment) (offered to call RN for pain meds, pt declined offer)           PT Goals (current goals can now be found in the care plan section) Acute Rehab PT Goals  Patient Stated Goal: to go home Time For Goal Achievement: 07/01/14 Progress towards PT goals: Progressing toward goals    Frequency  Min 3X/week    PT Plan Current plan remains appropriate       End of Session Equipment Utilized During Treatment: Gait belt Activity Tolerance: Patient limited by fatigue;Patient limited by pain Patient left: in  chair;with call [place]/phone within reach;with chair alarm set     Time: 1421-1447 PT Time Calculation (min) (ACUTE ONLY): 26 min  Charges:  $Gait Training: 8-22 mins $Therapeutic Exercise: 8-22 mins                      Anaija Wissink B. Gracin Mcpartland, PT, DPT [phone]   06/19/2014, 3:43 PM

## 2014-06-19 NOTE — Progress Notes (Signed)
[facility] PROGRESS NOTE  Juan [name] XLK:[id number] DOB: [birth year] DOA: 06/16/2014 PCP: [name], MD  Summary Juan [name] is a 74 y.o. male with a history of CAD, Diastolic CHF, AML, HTN, DM2, ?peripheral neuropathy who presented to the ED with complaints of worsening BLE edema/pain and difficulty walking as well as weakness and malaise and SOB after recent discharge from an Area SNF(less than a week after discharge from [place] where he resided for approximately 3 months per his report).He was found to have HCAP and cellulitis of the lower extremities more on the right side. Chest x-ray showed "Multifocal patchy airspace opacities in the central and basilar regions bilaterally, present on numerous prior studies although it does appear to have been slowly worsening compared to older examinations. Chronic infectious, inflammatory or neoplastic processes may be considered. No convincing acute findings are evident". Bilateral venous Doppler of the lower extremities was negative for DVT. It would appear that the lower extremity pains were related to cellulitis perhaps superimposed on peripheral neuropathy related to diabetes. His dose of Neurontin was therefore increased for better pain control, and has room for adjustment upwards as patient tolerates. In terms of the healthcare associated pneumonia, white count has improved to 17,000, probably closer to the baseline as patient has myeloproliferative disorder with chronic leukocytosis. Will continue current antibiotics including Vancomycin/Cefepime per pharmacy to complete 7-10 days of antibiotics. Patient is immunocompromised therefore will err on the side of treating infection aggressively. He reports that the leg pains and swelling are better but not where he was before this illness. He should continue working with physical therapy will have recommended short-term rehabilitation. He is reluctant to go back to the same skilled nursing facility because  he believes the infection in the legs was ignored at the facility. Plan HCAP (healthcare-associated pneumonia)/Cellulitis of leg, right/Weakness generalized/Pain in limb/Peripheral edema/Chronic lower extremity edema, stasis dermatitis/Unable to ambulate/Peripheral neuropathy  Follow septic workup. Vitamin B12 level/RPR/TSH normal.  Continue antibiotics per pharmacy  Continue Neurontin 100 mg 3 times a day  Percocet as needed for pain. Mucinex for cough.  Physical therapy Myeloproliferative disease  No acute changes Essential hypertension/Chronic diastolic CHF (congestive heart failure)/Type II diabetes mellitus/CKD (chronic kidney disease) stage 3, GFR 30-59 ml/min  No acute changes  Check BNP DVT prophylaxis  Code Status: Full code Family Communication: Patient at bedside Disposition Plan: To be determined-recommended for snf placement.   Consultants:  None  Procedures:  None  Antibiotics:  Vancomycin 06/17/2014>  Cefepime 06/17/2014>  HPI/Subjective: Still has leg pains but cough is better.  Objective: Filed Vitals:   06/19/14 1339  BP: 105/62  Pulse: 62  Temp: 98.1 F (36.7 C)  Resp: 18    Intake/Output Summary (Last 24 hours) at 06/19/14 1812 Last data filed at 06/19/14 1700  Gross per 24 hour  Intake    720 ml  Output   1300 ml  Net   -580 ml   Filed Weights   06/17/14 0100 06/17/14 0145  Weight: 103.9 kg (229 lb 0.9 oz) 102.6 kg (226 lb 3.1 oz)    Exam:   General:  Not in distress  Cardiovascular: S1-S2 normal. No murmurs. RRR.  Respiratory: Good air entry bilaterally with no rhonchi or rails.  Abdomen: Soft and nontender. Normal bowel sounds.  Musculoskeletal: Less erythema and inflammation both legs.   Data Reviewed: Basic Metabolic Panel:  Recent Labs Lab 06/16/14 1940 06/17/14 0435 06/18/14 0633  NA 142 138 140  K 5.0 4.4 4.2  CL 111  109 109  CO2 20 24 26   GLUCOSE 105* 118* 95  BUN 35* 30* 24*  CREATININE 1.37*  1.22 1.18  CALCIUM 10.0 9.5 9.4   Liver Function Tests:  Recent Labs Lab 06/16/14 1940 06/18/14 0633  AST 22 21  ALT 11 8  ALKPHOS 97 78  BILITOT 0.4 0.5  PROT 6.6 5.8*  ALBUMIN 3.3* 2.7*   No results for input(s): LIPASE, AMYLASE in the last 168 hours. No results for input(s): AMMONIA in the last 168 hours. CBC:  Recent Labs Lab 06/16/14 1940 06/17/14 0435 06/18/14 0633  WBC 21.0* 20.1* 17.1*  NEUTROABS 14.7*  --   --   HGB 8.8* 8.1* 8.1*  HCT 28.6* 25.7* 26.3*  MCV 82.4 82.4 83.8  PLT 323 319 284   Cardiac Enzymes:  Recent Labs Lab 06/16/14 1940  TROPONINI <0.03   BNP (last 3 results)  Recent Labs  06/16/14 1940  BNP 18.8    ProBNP (last 3 results)  Recent Labs  01/25/14 1115 03/29/14 1549 04/14/14 2335  PROBNP 828.2* 6628.0* 4369.0*    CBG:  Recent Labs Lab 06/18/14 1609 06/18/14 2132 06/19/14 0641 06/19/14 1134 06/19/14 1541  GLUCAP 99 101* 98 107* 122*    No results found for this or any previous visit (from the past 240 hour(s)).   Studies: No results found.  Scheduled Meds: . aspirin  325 mg Oral Daily  . b complex vitamins  1 capsule Oral Daily  . ceFEPime (MAXIPIME) IV  1 g Intravenous Q12H  . enoxaparin (LOVENOX) injection  40 mg Subcutaneous Q24H  . feeding supplement (GLUCERNA SHAKE)  237 mL Oral BID BM  . ferrous sulfate  325 mg Oral Q breakfast  . fluticasone  2 spray Each Nare Daily  . folic acid  1 mg Oral Daily  . furosemide  40 mg Oral BID  . gabapentin  100 mg Oral TID  . guaiFENesin  1,200 mg Oral BID  . insulin aspart  0-5 Units Subcutaneous QHS  . insulin aspart  0-9 Units Subcutaneous TID WC  . metoprolol tartrate  25 mg Oral BID  . pantoprazole  40 mg Oral Daily  . potassium chloride SA  20 mEq Oral Daily  . senna-docusate  1 tablet Oral QHS  . sodium chloride  3 mL Intravenous Q12H  . vancomycin  750 mg Intravenous Q12H  . zolpidem  5 mg Oral QHS   Continuous Infusions:   Principal Problem:    HCAP (healthcare-associated pneumonia) Active Problems:   Myeloproliferative disease   Essential hypertension   Chronic diastolic CHF (congestive heart failure)   Chronic lower extremity edema, stasis dermatitis   Type II diabetes mellitus   CKD (chronic kidney disease) stage 3, GFR 30-59 ml/min   Weakness generalized   Pain in limb   Peripheral edema   Unable to ambulate   Cellulitis of leg, right   Peripheral neuropathy    Time spent: 25 minutes.    Alsace Dowd  [facility] Pager [phone]. If 7PM-7AM, please contact night-coverage at [email or web address], password [facility] 06/19/2014, 6:12 PM  LOS: 3 days

## 2014-06-20 LAB — COMPREHENSIVE METABOLIC PANEL
ALK PHOS: 87 U/L (ref 39–117)
ALT: 9 U/L (ref 0–53)
ANION GAP: 6 (ref 5–15)
AST: 27 U/L (ref 0–37)
Albumin: 2.9 g/dL — ABNORMAL LOW (ref 3.5–5.2)
BILIRUBIN TOTAL: 0.5 mg/dL (ref 0.3–1.2)
BUN: 24 mg/dL — ABNORMAL HIGH (ref 6–23)
CALCIUM: 9.3 mg/dL (ref 8.4–10.5)
CO2: 27 mmol/L (ref 19–32)
CREATININE: 1.2 mg/dL (ref 0.50–1.35)
Chloride: 105 mmol/L (ref 96–112)
GFR, EST AFRICAN AMERICAN: 68 mL/min — AB (ref >90)
GFR, EST NON AFRICAN AMERICAN: 59 mL/min — AB (ref >90)
Glucose, Bld: 98 mg/dL (ref 70–99)
Potassium: 4.6 mmol/L (ref 3.5–5.1)
Sodium: 138 mmol/L (ref 135–145)
Total Protein: 6.2 g/dL (ref 6.0–8.3)

## 2014-06-20 LAB — CBC
HEMATOCRIT: 27.6 % — AB (ref 39.0–52.0)
HEMOGLOBIN: 8.7 g/dL — AB (ref 13.0–17.0)
MCH: 25.8 pg — ABNORMAL LOW (ref 26.0–34.0)
MCHC: 31.5 g/dL (ref 30.0–36.0)
MCV: 81.9 fL (ref 78.0–100.0)
PLATELETS: 297 10*3/uL (ref 150–400)
RBC: 3.37 MIL/uL — AB (ref 4.22–5.81)
RDW: 19.7 % — ABNORMAL HIGH (ref 11.5–15.5)
WBC: 19 10*3/uL — AB (ref 4.0–10.5)

## 2014-06-20 LAB — GLUCOSE, CAPILLARY
GLUCOSE-CAPILLARY: 93 mg/dL (ref 70–99)
GLUCOSE-CAPILLARY: 119 mg/dL — AB (ref 70–99)
Glucose-Capillary: 123 mg/dL — ABNORMAL HIGH (ref 70–99)
Glucose-Capillary: 115 mg/dL — ABNORMAL HIGH (ref 70–99)

## 2014-06-20 LAB — BRAIN NATRIURETIC PEPTIDE: B Natriuretic Peptide: 227.8 pg/mL — ABNORMAL HIGH (ref 0.0–100.0)

## 2014-06-20 LAB — HEMOGLOBIN A1C
Hgb A1c MFr Bld: 5.7 % — ABNORMAL HIGH (ref 4.8–5.6)
MEAN PLASMA GLUCOSE: 117 mg/dL

## 2014-06-20 MED ORDER — GABAPENTIN 100 MG PO CAPS: 100.0000 mg | ORAL_CAPSULE | Freq: Three times a day (TID) | ORAL | Status: DC

## 2014-06-20 MED ORDER — SENNOSIDES-DOCUSATE SODIUM 8.6-50 MG PO TABS: 1.0000 | ORAL_TABLET | Freq: Every day | ORAL | Status: DC

## 2014-06-20 NOTE — Progress Notes (Signed)
Dear Doctor:  This patient has been identified as a candidate for PICC for the following reason (s): drug extravasation potential with tissue necrosis (KCL, Dilantin, Dopamine, CaCl, MgSO4, chemo vesicant) and poor veins/poor circulatory system (CHF, COPD, emphysema, diabetes, steroid use, IV drug abuse, etc.) If you agree, please write an order for the indicated device. For any questions contact the Vascular Access Team at [phone] if no answer, please leave a message.  Thank you for supporting the early vascular access assessment program.

## 2014-06-20 NOTE — Progress Notes (Signed)
[facility] PROGRESS NOTE  Juan [name] VEL:[id number] DOB: [birth year] DOA: 06/16/2014 PCP: [name], MD  Summary Juan [name] is a 74 y.o. male with a history of CAD, Diastolic CHF, AML, HTN, DM2, ?peripheral neuropathy who presented to the ED with complaints of worsening BLE edema/pain in feet and difficulty walking as well as weakness,malaise and SOB. It had been less than a week after discharge from [place] where he resided for approximately 3 months per his report.  He was found to have HCAP with multifocal patchy opacities on CXR. Bilateral venous Doppler of the lower extremities was negative for DVT. It would appear that the lower extremity pains were related to cellulitis perhaps superimposed on peripheral neuropathy related to diabetes. His dose of Neurontin was therefore increased for better pain control.   Plan HCAP (healthcare-associated pneumonia) and ? Cellulitis of leg right  Continue Vanc and Cefepime-would not give more than 5 days of antibiotics- tomorrow is the last day  has a chronically elevated WBC count  No signs of cellulitis on exam today- H and P states he had stasis dermatitis   Physical therapy  Peripheral neuropathy Continue Neurontin 100 mg 3 times a day - tender only in right heel- no c/o pain in legs today  Myeloproliferative disease  chronically elevated WBC count  Essential hypertension Cont Metoprolol  Chronic diastolic CHF (congestive heart failure) - cont current dose of Lasix and follow volume status  Type II diabetes mellitus - sugars stable  CKD (chronic kidney disease) stage 3, GFR 30-59 ml/min  No acute changes   DVT prophylaxis  Code Status: Full code Family Communication: Patient  Disposition Plan: refusing a SNF bed- can go home tomorrow- I have discussed this with patient and with social work Research officer, political party   Consultants:  None  Procedures:  B/l venous duplex- negative on 1/30  Antibiotics:  Vancomycin  06/17/2014>  Cefepime 06/17/2014>  HPI/Subjective: States he cannot leave the hospital because he is unable to walk- has no c/o pain in feet- cough is improving.   Objective: Filed Vitals:   06/20/14 1216  BP: 111/53  Pulse: 72  Temp: 97.5 F (36.4 C)  Resp: 16    Intake/Output Summary (Last 24 hours) at 06/20/14 1222 Last data filed at 06/20/14 1100  Gross per 24 hour  Intake    930 ml  Output   2050 ml  Net  -1120 ml   Filed Weights   06/17/14 0100 06/17/14 0145  Weight: 103.9 kg (229 lb 0.9 oz) 102.6 kg (226 lb 3.1 oz)    Exam:   General:  Awake and alert, no distress  Cardiovascular: S1-S2 normal. No murmurs. RRR.  Respiratory: Good air entry bilaterally with no rhonchi or rails.  Abdomen: Soft and nontender. Normal bowel sounds.  Musculoskeletal: no edema, no ulcers on feet or leg, no cellulitis- tender on right heel  Data Reviewed: Basic Metabolic Panel:  Recent Labs Lab 06/16/14 1940 06/17/14 0435 06/18/14 0633 06/20/14 0538  NA 142 138 140 138  K 5.0 4.4 4.2 4.6  CL 111 109 109 105  CO2 20 24 26 27   GLUCOSE 105* 118* 95 98  BUN 35* 30* 24* 24*  CREATININE 1.37* 1.22 1.18 1.20  CALCIUM 10.0 9.5 9.4 9.3   Liver Function Tests:  Recent Labs Lab 06/16/14 1940 06/18/14 0633 06/20/14 0538  AST 22 21 27   ALT 11 8 9   ALKPHOS 97 78 87  BILITOT 0.4 0.5 0.5  PROT 6.6 5.8* 6.2  ALBUMIN 3.3*  2.7* 2.9*   No results for input(s): LIPASE, AMYLASE in the last 168 hours. No results for input(s): AMMONIA in the last 168 hours. CBC:  Recent Labs Lab 06/16/14 1940 06/17/14 0435 06/18/14 0633 06/20/14 0538  WBC 21.0* 20.1* 17.1* 19.0*  NEUTROABS 14.7*  --   --   --   HGB 8.8* 8.1* 8.1* 8.7*  HCT 28.6* 25.7* 26.3* 27.6*  MCV 82.4 82.4 83.8 81.9  PLT 323 319 284 297   Cardiac Enzymes:  Recent Labs Lab 06/16/14 1940  TROPONINI <0.03   BNP (last 3 results)  Recent Labs  06/16/14 1940 06/20/14 0538  BNP 18.8 227.8*    ProBNP  (last 3 results)  Recent Labs  01/25/14 1115 03/29/14 1549 04/14/14 2335  PROBNP 828.2* 6628.0* 4369.0*    CBG:  Recent Labs Lab 06/19/14 0641 06/19/14 1134 06/19/14 1541 06/19/14 2131 06/20/14 0644  GLUCAP 98 107* 122* 95 93    No results found for this or any previous visit (from the past 240 hour(s)).   Studies: No results found.  Scheduled Meds: . aspirin  325 mg Oral Daily  . b complex vitamins  1 capsule Oral Daily  . ceFEPime (MAXIPIME) IV  1 g Intravenous Q12H  . enoxaparin (LOVENOX) injection  40 mg Subcutaneous Q24H  . feeding supplement (GLUCERNA SHAKE)  237 mL Oral BID BM  . ferrous sulfate  325 mg Oral Q breakfast  . fluticasone  2 spray Each Nare Daily  . folic acid  1 mg Oral Daily  . furosemide  40 mg Oral BID  . gabapentin  100 mg Oral TID  . guaiFENesin  1,200 mg Oral BID  . insulin aspart  0-5 Units Subcutaneous QHS  . insulin aspart  0-9 Units Subcutaneous TID WC  . metoprolol tartrate  25 mg Oral BID  . pantoprazole  40 mg Oral Daily  . potassium chloride SA  20 mEq Oral Daily  . senna-docusate  1 tablet Oral QHS  . sodium chloride  3 mL Intravenous Q12H  . vancomycin  750 mg Intravenous Q12H  . zolpidem  5 mg Oral QHS   Continuous Infusions:      Time spent: 35 minutes.    [facility]  [facility] Pager: [email or web address], password [facility] 06/20/2014, 12:22 PM  LOS: 4 days

## 2014-06-20 NOTE — Discharge Summary (Deleted)
Physician Discharge Summary  Juan [name] QAS:[id number] DOB: 1940-06-29 DOA: 06/16/2014  PCP: Juan November, MD  Admit date: 06/16/2014 Discharge date: 06/20/2014  Time spent: 55 minutes  Recommendations for Outpatient Follow-up:  1. Daily weights 2. Bmet in 1 wk to check renal function and K+ level  Discharge Condition: stable Diet recommendation: heart healthy, low sodium, diabetic diet  Discharge Diagnoses:  Principal Problem:   HCAP (healthcare-associated pneumonia) Active Problems:   Myeloproliferative disease   Essential hypertension   Chronic diastolic CHF (congestive heart failure)   Chronic lower extremity edema, stasis dermatitis   Type II diabetes mellitus   CKD (chronic kidney disease) stage 3, GFR 30-59 ml/min   Weakness generalized   Pain in limb   Peripheral edema   Unable to ambulate   Cellulitis of leg, right?   Peripheral neuropathy   History of present illness:  Juan [name] is a 74 y.o. male with a history of CAD, Diastolic CHF, AML, HTN, DM2, ?peripheral neuropathy who presented to the ED with complaints of worsening BLE edema/pain in feet and difficulty walking as well as weakness,malaise and SOB. It had been less than a week after discharge from [place] where he resided for approximately 3 months per his report.  He was found to have HCAP with multifocal patchy opacities on CXR. Bilateral venous Doppler of the lower extremities was negative for DVT. It would appear that the lower extremity pains were related to cellulitis perhaps superimposed on peripheral neuropathy related to diabetes. His dose of Neurontin was therefore increased for better pain control.   Hospital Course:  HCAP (healthcare-associated pneumonia) and ? Cellulitis of leg right Has completed 5 days of Vanc and Cefepime-would not give more- no cough or fevers Has a chronically elevated WBC count No signs of cellulitis on exam today- H and P states he had stasis dermatitis therefore I am not  certain if he had cellulitis Physical therapy  Peripheral neuropathy Continue Neurontin 100 mg 3 times a day Tender only in right heel- will get PT at home  Myeloproliferative disease chronically elevated WBC count  Essential hypertension Cont Metoprolol  Chronic diastolic CHF (congestive heart failure) - cont current dose of Lasix and follow volume status  Type II diabetes mellitus - sugars stable- A1c 5.7  CKD (chronic kidney disease) stage 3, GFR 30-59 ml/min No acute changes    Procedures:  B/l venous duplex- negative on 1/30   Discharge Exam: Filed Weights   06/17/14 0100 06/17/14 0145  Weight: 103.9 kg (229 lb 0.9 oz) 102.6 kg (226 lb 3.1 oz)   Filed Vitals:   06/20/14 1216  BP: 111/53  Pulse: 72  Temp: 97.5 F (36.4 C)  Resp: 16    General: AAO x 3, no distress Cardiovascular: RRR, no murmurs  Respiratory: clear to auscultation bilaterally GI: soft, non-tender, non-distended, bowel sound positive MSK: no edema or ulcers on legs  Discharge Instructions You were cared for by a hospitalist during your hospital stay. If you have any questions about your discharge medications or the care you received while you were in the hospital after you are discharged, you can call the unit and asked to speak with the hospitalist on call if the hospitalist that took care of you is not available. Once you are discharged, your primary care physician will handle any further medical issues. Please note that NO REFILLS for any discharge medications will be authorized once you are discharged, as it is imperative that you return to your primary care physician (  or establish a relationship with a primary care physician if you do not have one) for your aftercare needs so that they can reassess your need for medications and monitor your lab values.     Medication List    STOP taking these medications        pantoprazole 40 MG tablet  Commonly known as:  PROTONIX      TAKE  these medications        acetaminophen 500 MG tablet  Commonly known as:  TYLENOL  Take 500-1,000 mg by mouth every 6 (six) hours as needed for moderate pain or headache.     aspirin 325 MG tablet  Take 1 tablet (325 mg total) by mouth daily.     b complex vitamins capsule  Take 1 capsule by mouth daily.     feeding supplement (GLUCERNA SHAKE) Liqd  Take 237 mLs by mouth 2 (two) times daily between meals.     Ferrous Sulfate Dried 200 (65 FE) MG Tabs  Take 1 tablet by mouth daily.     fluticasone 50 MCG/ACT nasal spray  Commonly known as:  FLONASE  Place 2 sprays into both nostrils daily.     folic acid 1 MG tablet  Commonly known as:  FOLVITE  Take 1 mg by mouth daily.     furosemide 40 MG tablet  Commonly known as:  LASIX  Take 1 tablet (40 mg total) by mouth 2 (two) times daily.     gabapentin 100 MG capsule  Commonly known as:  NEURONTIN  Take 1 capsule (100 mg total) by mouth 3 (three) times daily.     hydrocerin Crea  Apply Eucerin cream to BLE Q day after bathing and roughly towel drying to remove loose skin     HYDROcodone-acetaminophen 5-325 MG per tablet  Commonly known as:  NORCO/VICODIN  Take 1 tablet by mouth every 6 (six) hours as needed for moderate pain.     metoprolol tartrate 25 MG tablet  Commonly known as:  LOPRESSOR  Take 1 tablet (25 mg total) by mouth 2 (two) times daily.     potassium chloride SA 20 MEQ tablet  Commonly known as:  K-DUR,KLOR-CON  Take 20 mEq by mouth daily.     senna-docusate 8.6-50 MG per tablet  Commonly known as:  Senokot-S  Take 1 tablet by mouth at bedtime.     sodium chloride 0.65 % Soln nasal spray  Commonly known as:  OCEAN  Place 1-2 sprays into both nostrils every 4 (four) hours as needed for congestion.     zolpidem 5 MG tablet  Commonly known as:  AMBIEN  Take 1 tablet (5 mg total) by mouth at bedtime.       No Known Allergies Follow-up Information    Follow up with Juan November, MD.   Specialty:   Internal Medicine   Contact information:   [address] [phone]        The results of significant diagnostics from this hospitalization (including imaging, microbiology, ancillary and laboratory) are listed below for reference.    Significant Diagnostic Studies: Dg Chest 2 View  06/16/2014   CLINICAL DATA:  Lower extremity pain and swelling.  EXAM: CHEST  2 VIEW  COMPARISON:  Prior radiographs dated 04/15/2014, 03/31/2014, 11 04/2014, 01/26/2014 and 12/09/2013  FINDINGS: There is unchanged right hemidiaphragm elevation. There is unchanged cardiomegaly and aortic tortuosity. Scattered airspace opacities are again evident in the central and basilar  regions of both lungs, without significant interval change. Diffuse bony sclerosis is again evident and may relate to the described history of a myeloproliferative disease.  IMPRESSION: Multifocal patchy airspace opacities in the central and basilar regions bilaterally, present on numerous prior studies although it does appear to have been slowly worsening compared to older examinations. Chronic infectious, inflammatory or neoplastic processes may be considered. No convincing acute findings are evident.   Electronically Signed   By: Andreas Newport M.D.   On: 06/16/2014 22:51    Microbiology: No results found for this or any previous visit (from the past 240 hour(s)).   Labs: Basic Metabolic Panel:  Recent Labs Lab 06/16/14 1940 06/17/14 0435 06/18/14 0633 06/20/14 0538  NA 142 138 140 138  K 5.0 4.4 4.2 4.6  CL 111 109 109 105  CO2 20 24 26 27   GLUCOSE 105* 118* 95 98  BUN 35* 30* 24* 24*  CREATININE 1.37* 1.22 1.18 1.20  CALCIUM 10.0 9.5 9.4 9.3   Liver Function Tests:  Recent Labs Lab 06/16/14 1940 06/18/14 0633 06/20/14 0538  AST 22 21 27   ALT 11 8 9   ALKPHOS 97 78 87  BILITOT 0.4 0.5 0.5  PROT 6.6 5.8* 6.2  ALBUMIN 3.3* 2.7* 2.9*   No results for input(s): LIPASE, AMYLASE  in the last 168 hours. No results for input(s): AMMONIA in the last 168 hours. CBC:  Recent Labs Lab 06/16/14 1940 06/17/14 0435 06/18/14 0633 06/20/14 0538  WBC 21.0* 20.1* 17.1* 19.0*  NEUTROABS 14.7*  --   --   --   HGB 8.8* 8.1* 8.1* 8.7*  HCT 28.6* 25.7* 26.3* 27.6*  MCV 82.4 82.4 83.8 81.9  PLT 323 319 284 297   Cardiac Enzymes:  Recent Labs Lab 06/16/14 1940  TROPONINI <0.03   BNP: BNP (last 3 results)  Recent Labs  06/16/14 1940 06/20/14 0538  BNP 18.8 227.8*    ProBNP (last 3 results)  Recent Labs  01/25/14 1115 03/29/14 1549 04/14/14 2335  PROBNP 828.2* 6628.0* 4369.0*    CBG:  Recent Labs Lab 06/19/14 1134 06/19/14 1541 06/19/14 2131 06/20/14 0644 06/20/14 1256  GLUCAP 107* 122* 95 93 123*       SignedDebbe Odea, MD [facility] 06/20/2014, 4:51 PM

## 2014-06-20 NOTE — Discharge Instructions (Signed)
Weigh patient daily- if gaining > 2 lb in 3-4 days, call family doctor

## 2014-06-20 NOTE — Progress Notes (Signed)
06/20/14 Spoke with patient about d /c plans. Patient states that he lives with his son and is agreeable to resuming [place] with [facility]. Patient is not agreeable to going to a SNF. [place] [phone], spoke with Luellen Pucker, informed her that plan is for patient to discharge 06/21/14. [facility] was seeing patient for [facility] and HHPT. I will continue to follow for d/c needs.

## 2014-06-21 ENCOUNTER — Encounter (HOSPITAL_COMMUNITY): Payer: Self-pay | Admitting: *Deleted

## 2014-06-21 ENCOUNTER — Inpatient Hospital Stay (HOSPITAL_COMMUNITY): Payer: Medicare Other

## 2014-06-21 DIAGNOSIS — R627 Adult failure to thrive: Secondary | ICD-10-CM

## 2014-06-21 LAB — GLUCOSE, CAPILLARY
GLUCOSE-CAPILLARY: 104 mg/dL — AB (ref 70–99)
GLUCOSE-CAPILLARY: 109 mg/dL — AB (ref 70–99)
Glucose-Capillary: 131 mg/dL — ABNORMAL HIGH (ref 70–99)
Glucose-Capillary: 114 mg/dL — ABNORMAL HIGH (ref 70–99)

## 2014-06-21 LAB — VANCOMYCIN, TROUGH: Vancomycin Tr: 34.7 ug/mL (ref 10.0–20.0)

## 2014-06-21 MED ORDER — FERROUS SULFATE 325 (65 FE) MG PO TABS
325.0000 mg | ORAL_TABLET | Freq: Every day | ORAL | Status: DC
  Administered 2014-06-22 – 2014-06-24 (×3): 325 mg via ORAL
  Filled 2014-06-21 (×4): qty 1

## 2014-06-21 MED ORDER — VANCOMYCIN HCL IN DEXTROSE 750-5 MG/150ML-% IV SOLN
750.0000 mg | INTRAVENOUS | Status: DC
  Administered 2014-06-21: 750 mg via INTRAVENOUS
  Filled 2014-06-21: qty 150

## 2014-06-21 MED ORDER — MINERAL OIL RE ENEM
1.0000 | ENEMA | Freq: Once | RECTAL | Status: AC
  Administered 2014-06-21: 1 via RECTAL
  Filled 2014-06-21: qty 1

## 2014-06-21 MED ORDER — DOXYCYCLINE HYCLATE 100 MG PO TABS
100.0000 mg | ORAL_TABLET | Freq: Two times a day (BID) | ORAL | Status: DC
  Administered 2014-06-21 – 2014-06-25 (×8): 100 mg via ORAL
  Filled 2014-06-21 (×10): qty 1

## 2014-06-21 NOTE — Progress Notes (Signed)
PROGRESS NOTE  Juan [name] IPJ:[id number] DOB: 1941-04-22 DOA: 06/16/2014 PCP: [name], MD  HPI/Recap of past 24 hours: Poor historian ,reported feeling sick, vomiting, weakness. Not able to be specific. Talked to patient's son [name], he reported patient has been having progressive weakness for the last several months, patient also has significant weight loss recently. Son reported possible about 70lbs weight loss.  Assessment/Plan: Principal Problem:   HCAP (healthcare-associated pneumonia) Active Problems:   Myeloproliferative disease   Essential hypertension   Chronic diastolic CHF (congestive heart failure)   Chronic lower extremity edema, stasis dermatitis   Type II diabetes mellitus   CKD (chronic kidney disease) stage 3, GFR 30-59 ml/min   Weakness generalized   Pain in limb   Peripheral edema   Unable to ambulate   Cellulitis of leg, right   Peripheral neuropathy  N/v, kub no obstruction, Ct chest showed hard stool in colon, bowel regimen. Prn reglan, antiemetics.  Bilateral patchy infiltrate in lung, edema? Atypical infection? Sputum culture pending, will switch to doxycycline orally to minimize volume.   Bone metastasis? Myeloproliferative disease? Cbc with diff does have few blast cell reported. Patient is very poor historian? Although there is documented visit with oncology in the past, patient denies it, in denial? Son does not know the detail either, reported patient is not very forthcoming with medical problem in the past. Will consult oncology in am.  Chronic diastolic chf. Continue lasix. Monitor volume status. No obvious lower extremity today on exam  FTT, bilateral heal pressure ulcer, dressing, offloading measures. PT/OT/SNF, talked to son, son agree with snf. Patient currently state he think snf if a reasonable choice. Social worker consulted.   Code Status: full  Family Communication: patient and son  Disposition Plan: snf   Consultants:  Oncology  to be called on 2/5  Procedures:  none  Antibiotics:  Vanc/cefepime from admission to 2/4  Oral Doxycycline from 2/4 to   Objective: BP 120/59 mmHg  Pulse 77  Temp(Src) 98 F (36.7 C) (Oral)  Resp 18  Ht 6\' 5"  (1.956 m)  Wt 102.6 kg (226 lb 3.1 oz)  BMI 26.82 kg/m2  SpO2 98%  Intake/Output Summary (Last 24 hours) at 06/21/14 1832 Last data filed at 06/21/14 7341  Gross per 24 hour  Intake    170 ml  Output   1100 ml  Net   -930 ml   Filed Weights   06/17/14 0100 06/17/14 0145  Weight: 103.9 kg (229 lb 0.9 oz) 102.6 kg (226 lb 3.1 oz)    Exam:   General:  frail  Cardiovascular: rrr  Respiratory: diminised at bases, moderate air entry mid and upper lung fields, no wheezing/rhonchi/rales  Abdomen: soft, nt/nd, positive bs  Musculoskeletal: no edema, bilateral heal ulcer with dressing    Data Reviewed: Basic Metabolic Panel:  Recent Labs Lab 06/16/14 1940 06/17/14 0435 06/18/14 0633 06/20/14 0538  NA 142 138 140 138  K 5.0 4.4 4.2 4.6  CL 111 109 109 105  CO2 20 24 26 27   GLUCOSE 105* 118* 95 98  BUN 35* 30* 24* 24*  CREATININE 1.37* 1.22 1.18 1.20  CALCIUM 10.0 9.5 9.4 9.3   Liver Function Tests:  Recent Labs Lab 06/16/14 1940 06/18/14 0633 06/20/14 0538  AST 22 21 27   ALT 11 8 9   ALKPHOS 97 78 87  BILITOT 0.4 0.5 0.5  PROT 6.6 5.8* 6.2  ALBUMIN 3.3* 2.7* 2.9*   No results for input(s): LIPASE, AMYLASE in the last  168 hours. No results for input(s): AMMONIA in the last 168 hours. CBC:  Recent Labs Lab 06/16/14 1940 06/17/14 0435 06/18/14 0633 06/20/14 0538  WBC 21.0* 20.1* 17.1* 19.0*  NEUTROABS 14.7*  --   --   --   HGB 8.8* 8.1* 8.1* 8.7*  HCT 28.6* 25.7* 26.3* 27.6*  MCV 82.4 82.4 83.8 81.9  PLT 323 319 284 297   Cardiac Enzymes:    Recent Labs Lab 06/16/14 1940  TROPONINI <0.03   BNP (last 3 results)  Recent Labs  06/16/14 1940 06/20/14 0538  BNP 18.8 227.8*    ProBNP (last 3 results)  Recent Labs   01/25/14 1115 03/29/14 1549 04/14/14 2335  PROBNP 828.2* 6628.0* 4369.0*    CBG:  Recent Labs Lab 06/20/14 1704 06/20/14 2229 06/21/14 0634 06/21/14 1141 06/21/14 1638  GLUCAP 115* 119* 104* 109* 131*    No results found for this or any previous visit (from the past 240 hour(s)).   Studies: No results found.  Scheduled Meds: . aspirin  325 mg Oral Daily  . b complex vitamins  1 capsule Oral Daily  . ceFEPime (MAXIPIME) IV  1 g Intravenous Q12H  . enoxaparin (LOVENOX) injection  40 mg Subcutaneous Q24H  . feeding supplement (GLUCERNA SHAKE)  237 mL Oral BID BM  . ferrous sulfate  325 mg Oral Q breakfast  . fluticasone  2 spray Each Nare Daily  . folic acid  1 mg Oral Daily  . furosemide  40 mg Oral BID  . gabapentin  100 mg Oral TID  . guaiFENesin  1,200 mg Oral BID  . insulin aspart  0-5 Units Subcutaneous QHS  . insulin aspart  0-9 Units Subcutaneous TID WC  . metoprolol tartrate  25 mg Oral BID  . pantoprazole  40 mg Oral Daily  . potassium chloride SA  20 mEq Oral Daily  . senna-docusate  1 tablet Oral QHS  . sodium chloride  3 mL Intravenous Q12H  . vancomycin  750 mg Intravenous Q24H  . zolpidem  5 mg Oral QHS    Continuous Infusions: none    Adamaris King  [facility] Pager [phone]. If 7PM-7AM, please contact night-coverage at [email or web address], password [facility] 06/21/2014, 6:32 PM  LOS: 5 days

## 2014-06-21 NOTE — Progress Notes (Signed)
CRITICAL VALUE ALERT  Critical value received:  Vanc trough 34.7  Date of notification:  06/21/14  Time of notification:  0208  Critical value read back:Yes.    Nurse who received alert:  Ethelda Chick, RN  MD notified (1st page):  Tylene Fantasia, NP (coverage for [facility])  Time of first page:  0221  Responding MD:  n/a  Time MD responded:  NP did not respond, pharmacy called and instructed to hold 2 am dose of Vanc.

## 2014-06-21 NOTE — Progress Notes (Signed)
Physical Therapy Treatment Patient Details Name: Juan [name] MRN: [id number] DOB: 25-Aug-1940 Today's Date: 06/21/2014    History of Present Illness 74 y.o. male with a history of CAD, Diastolic CHF, AML,  HTN,  DM2 who presents to the ED with complaints of worsening BLE edema and difficulty walking as well as weakness and malaise and SOB.  Pt dx with HCAP and cellulitis of right leg.    PT Comments    PT. Was motivated to walk and improved his distance today and was not limited by fatigue, not needing to sit. Pt. Reported no pain but weakness in R LE. Pt. States that he does not have 24 h care at home, so until condition improves SNF would be appropriate.  Follow Up Recommendations  SNF;Supervision/Assistance - 24 hour     Equipment Recommendations  Rolling walker with 5" wheels    Recommendations for Other Services OT consult     Precautions / Restrictions Precautions Precautions: Fall Restrictions Weight Bearing Restrictions: No    Mobility  Bed Mobility Overal bed mobility: Needs Assistance Bed Mobility: Supine to Sit Rolling: Supervision   Supine to sit: Modified independent (Device/Increase time);HOB elevated     General bed mobility comments: Pt was able to get himself to EOB with HOB mildly elevated and use of bil arms on bed rail.    Transfers Overall transfer level: Needs assistance Equipment used: Rolling walker (2 wheeled) Transfers: Sit to/from [facility] Sit to Stand: Mod assist;From elevated surface Stand pivot transfers: Min assist       General transfer comment: One person mod A from bed. cues for hand placement. Verbal cues once standing for upright posture. serial sit > partial stand x 3 bouts; sit . full stand x 2.  Ambulation/Gait Ambulation/Gait assistance: Min assist;+2 safety/equipment Ambulation Distance (Feet): 90 Feet Assistive device: Rolling walker (2 wheeled) Gait Pattern/deviations: Decreased stride length;Trunk  flexed;Step-to pattern;Step-through pattern Gait velocity: decreased Gait velocity interpretation: Below normal speed for age/gender General Gait Details: vc's tio increasde from step to to step through and for upright posture. Second person for safety and chair follow. standing rest break x 1   Stairs            Wheelchair Mobility    Modified Rankin (Stroke Patients Only)       Balance Overall balance assessment: Needs assistance Sitting-balance support: Feet supported;No upper extremity supported Sitting balance-Leahy Scale: Good       Standing balance-Leahy Scale: Poor Standing balance comment:  one person assist and RW for balance                    Cognition Arousal/Alertness: Awake/alert Behavior During Therapy: WFL for tasks assessed/performed Overall Cognitive Status: No family/caregiver present to determine baseline cognitive functioning                      Exercises      General Comments General comments (skin integrity, edema, etc.): cellulitis and edema improved as per nurse' s observation      Pertinent Vitals/Pain Pain Assessment: No/denies pain    Home Living                      Prior Function            PT Goals (current goals can now be found in the care plan section) Progress towards PT goals: Progressing toward goals    Frequency       PT  Plan Current plan remains appropriate    Co-evaluation             End of Session Equipment Utilized During Treatment: Gait belt Activity Tolerance: Patient tolerated treatment well Patient left: in chair;with call [place]/phone within reach;with chair alarm set     Time: 2575-0518 PT Time Calculation (min) (ACUTE ONLY): 30 min  Charges:                       G Codes:      Jodi Geralds, SPTA 06/21/2014, 9:34 AM

## 2014-06-21 NOTE — Progress Notes (Signed)
ANTIBIOTIC CONSULT NOTE - FOLLOW UP  Pharmacy Consult for vancomycin Indication: pneumonia and possible cellulitis  Labs:  Recent Labs  06/18/14 0633 06/20/14 0538  WBC 17.1* 19.0*  HGB 8.1* 8.7*  PLT 284 297  CREATININE 1.18 1.20   Estimated Creatinine Clearance: 70.1 mL/min (by C-G formula based on Cr of 1.2).  Recent Labs  06/21/14 0113  VANCOTROUGH 34.7*       Assessment: 74yo male supratherapeutic on vancomycin with initial dosing for HCAP and possible cellulitis, no change in renal function.  Goal of Therapy:  Vancomycin trough level 15-20 mcg/ml  Plan:  Will change vancomycin to 750mg  IV Q24H for calculated trough closer to 15 and continue to monitor.  Wynona Neat, PharmD, BCPS  06/21/2014,2:11 AM

## 2014-06-22 ENCOUNTER — Ambulatory Visit: Payer: Self-pay | Admitting: Internal Medicine

## 2014-06-22 ENCOUNTER — Telehealth: Payer: Self-pay | Admitting: Cardiovascular Disease

## 2014-06-22 ENCOUNTER — Inpatient Hospital Stay (HOSPITAL_COMMUNITY): Payer: Medicare Other

## 2014-06-22 DIAGNOSIS — D72829 Elevated white blood cell count, unspecified: Secondary | ICD-10-CM

## 2014-06-22 DIAGNOSIS — D649 Anemia, unspecified: Secondary | ICD-10-CM

## 2014-06-22 DIAGNOSIS — D696 Thrombocytopenia, unspecified: Secondary | ICD-10-CM

## 2014-06-22 LAB — COMPREHENSIVE METABOLIC PANEL
ALBUMIN: 3 g/dL — AB (ref 3.5–5.2)
ALK PHOS: 84 U/L (ref 39–117)
ALT: 11 U/L (ref 0–53)
AST: 31 U/L (ref 0–37)
Anion gap: 6 (ref 5–15)
BUN: 23 mg/dL (ref 6–23)
CO2: 24 mmol/L (ref 19–32)
Calcium: 9.9 mg/dL (ref 8.4–10.5)
Chloride: 109 mmol/L (ref 96–112)
Creatinine, Ser: 1.05 mg/dL (ref 0.50–1.35)
GFR calc non Af Amer: 69 mL/min — ABNORMAL LOW (ref >90)
GFR, EST AFRICAN AMERICAN: 80 mL/min — AB (ref >90)
Glucose, Bld: 115 mg/dL — ABNORMAL HIGH (ref 70–99)
Potassium: 4.7 mmol/L (ref 3.5–5.1)
Sodium: 139 mmol/L (ref 135–145)
Total Bilirubin: 0.6 mg/dL (ref 0.3–1.2)
Total Protein: 6.2 g/dL (ref 6.0–8.3)

## 2014-06-22 LAB — EXPECTORATED SPUTUM ASSESSMENT W GRAM STAIN, RFLX TO RESP C

## 2014-06-22 LAB — CBC WITH DIFFERENTIAL/PLATELET
Band Neutrophils: 0 % (ref 0–10)
Basophils Absolute: 2.5 10*3/uL — ABNORMAL HIGH (ref 0.0–0.1)
Basophils Relative: 11 % — ABNORMAL HIGH (ref 0–1)
Blasts: 2 %
Eosinophils Absolute: 1.1 10*3/uL — ABNORMAL HIGH (ref 0.0–0.7)
Eosinophils Relative: 5 % (ref 0–5)
HCT: 28.6 % — ABNORMAL LOW (ref 39.0–52.0)
Hemoglobin: 8.9 g/dL — ABNORMAL LOW (ref 13.0–17.0)
LYMPHS ABS: 3.6 10*3/uL (ref 0.7–4.0)
Lymphocytes Relative: 16 % (ref 12–46)
MCH: 26 pg (ref 26.0–34.0)
MCHC: 31.1 g/dL (ref 30.0–36.0)
MCV: 83.6 fL (ref 78.0–100.0)
METAMYELOCYTES PCT: 0 %
MYELOCYTES: 0 %
Monocytes Absolute: 2 10*3/uL — ABNORMAL HIGH (ref 0.1–1.0)
Monocytes Relative: 9 % (ref 3–12)
Neutro Abs: 12.7 10*3/uL — ABNORMAL HIGH (ref 1.7–7.7)
Neutrophils Relative %: 57 % (ref 43–77)
PROMYELOCYTES ABS: 0 %
Platelets: 331 10*3/uL (ref 150–400)
RBC: 3.42 MIL/uL — ABNORMAL LOW (ref 4.22–5.81)
RDW: 19.9 % — ABNORMAL HIGH (ref 11.5–15.5)
WBC: 22.3 10*3/uL — ABNORMAL HIGH (ref 4.0–10.5)
nRBC: 0 /100 WBC

## 2014-06-22 LAB — LACTATE DEHYDROGENASE: LDH: 1308 U/L — AB (ref 94–250)

## 2014-06-22 LAB — GLUCOSE, CAPILLARY
GLUCOSE-CAPILLARY: 104 mg/dL — AB (ref 70–99)
Glucose-Capillary: 110 mg/dL — ABNORMAL HIGH (ref 70–99)
Glucose-Capillary: 112 mg/dL — ABNORMAL HIGH (ref 70–99)
Glucose-Capillary: 261 mg/dL — ABNORMAL HIGH (ref 70–99)

## 2014-06-22 LAB — EXPECTORATED SPUTUM ASSESSMENT W REFEX TO RESP CULTURE

## 2014-06-22 LAB — SAVE SMEAR

## 2014-06-22 LAB — LIPASE, BLOOD: LIPASE: 21 U/L (ref 11–59)

## 2014-06-22 NOTE — Consult Note (Signed)
[place]  Telephone:(336) [place] NOTE  Juan [name]                                MR#: [id number]  DOB: Nov 01, 1940                       CSN#: [id number]  Referring MD: Dr. Sheliah Plane [facility]  IM Teaching Service    MD  Patient Care Team: Colon Branch, MD as PCP - General Roxana Hires, MD as Consulting Physician (Hematology and Oncology) [name], MD as Consulting Physician (Cardiology) [name], MD as Consulting Physician (Hematology and Oncology)  Reason for Consult: Leukemia   Juan [name]:[id number] [place] is a 74 y.o. male [facility] as he was found to have abnormal CBC in blood tests. (see below for details). 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 To date, it is unclear the level of comprehension regarding his oncologic status, the patient avoids questions regarding leukemia, or any treatments recommended for this diagnosis. He was admitted on 06/17/2014 following discharge from the skilled nursing facility recently, with worsening bilateral lower extremity edema, generalized weakness and malaise, as well as shortness of breath. The patient denies any fever, chills or productive cough. He reported decreased appetite. He denies any nausea or vomiting. He denies any bleeding issues such as epistaxis, hematemesis, hematochezia or hematuria. He denies any joint pain or myalgia.However, the patient is still having difficulties to walk  due to neuropathy. On presentation, a chest x-ray demonstrated patchy central and bibasilar airspace disease suspicious for healthcare associated pneumonia. Cultures were obtained, and broad-spectrum antibiotics were given. He received vancomycin and cefepime during this hospitalization, Then switched to oral doxycycline since 06/21/2014. Bilateral lower extremity Dopplers were  negative for DVT. His white count on 06/16/2014 were 21, with ANC of 14.7. His hemoglobin was 8.8, in his platelets were 323. His MCV was normal. Comparing to prior CBCs dating back to November of last year, his white count has remained within the same range. His platelet count is actually normal and his hemoglobin was stable. His peripheral blood smear is currently pending. We were requested to see the patient in consultation prior to his discharge to a skilled nursing facility. Currently, his white count Is 22.3, hemoglobin 8.9, MCV 83.6, and monos 2.0, normal platelets at 331,000.   SUMMARY OF ONCOLOGIC HISTORY:  He was found to have abnormal CBC from routine blood tests. He was already noted to have recurrent leukocytosis since December of 2013.  In July 2014, his white blood cell count went back to normal. Starting around August 2014, it became chronically elevated ranging from 12.4 to as high as 20.5. 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 The patient has trouble understanding the concept of leukemia. At the time he was informed that this is a life-threatening condition and would need bone marrow biopsy to find out the type of leukemia and to start treatment as soon as possible.  Unfortunately, we do not treat acute leukemia at [facility]. He was referred to [facility]. He was seen by Dr. [name]. Bone marrow biopsy was recommended but patient refused. Dr. 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 He had 3% blasts in the peripheral blood, which was confirmed by flow cytometry.  [name] potentially could be a candidate for treatment with Jakafi; however, he decided not to discuss these issues and return to [place].  He has failed to follow up since then.   The patient also have recurrent thrombocytosis. According to the  patient, he had extensive investigations at [place] and was placed on anagrelide. In 2007, his blood count was as high as 669,000. Since then, it has fluctuated between normal range to 472,000.  He was also noted to be chronically anemic since 2007. He is currently on oral iron supplements.        PMH:  Past Medical History  Diagnosis Date  . CAD (coronary artery disease)     dx elsewheer in past, no documentation. Non-ischemic myovue 2007  . CHF (congestive heart failure)     dx elsewhere, no documentation. Normal EF by previous echo. Trival AS needs f/u ECHO by 2012  . Hypertension   . Sleep apnea, obstructive     at some point used CPAP, was d/c  years ago  . Anemia   . History of thrombocytosis   . Allergic rhinitis   . Edema     R>L leg, u/s 5-12 neg for DVT  . Hemorrhoid   . Type II diabetes mellitus   . Sinus congestion   . Migraine     "once/wk at least" (07/11/2013)  . Shortness of breath   . Myeloproliferative disease   . Ascending aortic aneurysm 03/2014    4.3cm on CT scan    Surgeries:  Past Surgical History  Procedure Laterality Date  . Toe surgery Right     "tried to straighten out big toe" (07/11/2013)    Allergies: No Known Allergies  Medications:   Prior to Admission:  Prescriptions prior to admission  Medication Sig Dispense Refill Last Dose  . acetaminophen (TYLENOL) 500 MG tablet Take 500-1,000 mg by mouth every 6 (six) hours as needed for moderate pain or headache.    Past Week at Unknown time  . aspirin 325 MG tablet Take 1 tablet (325 mg total) by mouth daily.   06/16/2014 at Unknown time  . b complex vitamins capsule Take 1 capsule by mouth daily. 30 capsule 3 06/16/2014 at Unknown time  . feeding supplement, GLUCERNA SHAKE, (GLUCERNA SHAKE) LIQD Take 237 mLs by mouth 2 (two) times daily between meals.  0 06/16/2014 at Unknown time  . Ferrous Sulfate Dried 200 (65 FE) MG TABS Take 1 tablet by mouth daily.    06/16/2014 at Unknown time  .  fluticasone (FLONASE) 50 MCG/ACT nasal spray Place 2 sprays into both nostrils daily.    06/16/2014 at Unknown time  . folic acid (FOLVITE) 1 MG tablet Take 1 mg by mouth daily.    06/16/2014 at Unknown time  . furosemide (LASIX) 40 MG tablet Take 1 tablet (40 mg total) by mouth 2 (two) times daily.   06/16/2014 at Unknown time  . hydrocerin (EUCERIN) CREA Apply Eucerin cream to BLE Q day after bathing and roughly towel drying to remove loose skin 228 g 0 06/16/2014 at Unknown time  . HYDROcodone-acetaminophen (NORCO/VICODIN) 5-325 MG per tablet Take 1 tablet by mouth every 6 (six) hours as needed for moderate pain. 30 tablet 0 06/15/2014 at Unknown time  . metoprolol tartrate (LOPRESSOR) 25 MG tablet Take 1 tablet (25 mg total) by mouth 2 (two) times daily.  Past Week at Unknown time  . pantoprazole (PROTONIX) 40 MG tablet Take 1 tablet (40 mg total) by mouth daily.   Past Month at Unknown time  . potassium chloride SA (K-DUR,KLOR-CON) 20 MEQ tablet Take 20 mEq by mouth daily.   06/16/2014 at Unknown time  . sodium chloride (OCEAN) 0.65 % SOLN nasal spray Place 1-2 sprays into both nostrils every 4 (four) hours as needed for congestion.  0 06/16/2014 at Unknown time  . zolpidem (AMBIEN) 5 MG tablet Take 1 tablet (5 mg total) by mouth at bedtime. 30 tablet 0 Past Month at Unknown time  . [DISCONTINUED] gabapentin (NEURONTIN) 100 MG capsule Take 1 capsule (100 mg total) by mouth at bedtime.   06/15/2014 at Unknown time   Scheduled Meds: . aspirin  325 mg Oral Daily  . b complex vitamins  1 capsule Oral Daily  . doxycycline  100 mg Oral Q12H  . enoxaparin (LOVENOX) injection  40 mg Subcutaneous Q24H  . feeding supplement (GLUCERNA SHAKE)  237 mL Oral BID BM  . ferrous sulfate  325 mg Oral Q lunch  . fluticasone  2 spray Each Nare Daily  . folic acid  1 mg Oral Daily  . furosemide  40 mg Oral BID  . gabapentin  100 mg Oral TID  . guaiFENesin  1,200 mg Oral BID  . insulin aspart  0-5 Units Subcutaneous  QHS  . insulin aspart  0-9 Units Subcutaneous TID WC  . metoprolol tartrate  25 mg Oral BID  . pantoprazole  40 mg Oral Daily  . potassium chloride SA  20 mEq Oral Daily  . senna-docusate  1 tablet Oral QHS  . sodium chloride  3 mL Intravenous Q12H  . zolpidem  5 mg Oral QHS   Continuous Infusions:  PRN Meds:.sodium chloride, acetaminophen **OR** acetaminophen, alum & mag hydroxide-simeth, diclofenac sodium, HYDROmorphone (DILAUDID) injection, ondansetron **OR** ondansetron (ZOFRAN) IV, oxyCODONE-acetaminophen, sodium chloride, sodium chloride   ROS: Constitutional: Denies fevers, chills or abnormal night sweats Eyes: Denies blurriness of vision, double vision or watery eyes Ears, nose, mouth, throat, and face: Denies mucositis or sore throat Respiratory: Denies cough, dyspnea or wheezes Cardiovascular: Denies palpitation, chest discomfort, bilateral lower extremity swelling In the setting of venous stasis Gastrointestinal:  The patient has intermittent nauseaaAnd vomiting. Denies heartburn or change in bowel habits. The patient has lost about 70 pounds during the last year. Skin: He has bilateral heel ulcers treated by wound care.He has chronic venous stasis disease. Lymphatics: Denies new lymphadenopathy or easy bruising Neurological:Denies numbness, tingling or new weaknesses Behavioral/Psych: Mood is stable, no new changes. The patient avoids questions concerning oncological issues. He is also tangential on responses regarding his health status. Unsure of the patient's level of comprehension regarding leukemia. All other systems were reviewed with the patient and are negative.   Family History:    2 children with mental illness  Family History  Problem Relation Age of Onset  . Colon cancer Neg Hx   . Prostate cancer Neg Hx   . Heart attack Neg Hx   . Diabetes Neg Hx   . Schizophrenia Son     No family history of hematological  disorders.  Social History:  reports that he  quit smoking about 48 years ago. His smoking use included Cigarettes. He has a 3 pack-year smoking history. He has never used smokeless tobacco. He reports that he does not drink alcohol or use illicit drugs.   Physical Exam    ECOG PERFORMANCE  STATUS: 2  Filed Vitals:   06/22/14 0538  BP: 116/63  Pulse: 97  Temp: 98.2 F (36.8 C)  Resp: 17   Filed Weights   06/17/14 0100 06/17/14 0145  Weight: 229 lb 0.9 oz (103.9 kg) 226 lb 3.1 oz (102.6 kg)    GENERAL:alert, no distress and comfortable, Chronically ill appearing SKIN: skin color, texture, turgor are normal, no rashes. He has bilateral heel ulcers, treated by a wound care EYES: normal, conjunctiva are pink and non-injected, sclera clear OROPHARYNX:no exudate, no erythema and lips, buccal mucosa, and tongue normal  NECK: supple, thyroid normal size, non-tender, without nodularity LYMPH:  no palpable lymphadenopathy in the cervical, axillary or inguinal LUNGS: Decreased breath sounds at the bases, without rhonchi or rales, Normal breathing effort. ,HEART: regular rate & rhythm and no murmurs and no lower extremity edema ABDOMEN:abdomen soft, non-tender and normal bowel sounds Musculoskeletal:no cyanosis of digits and no clubbing  PSYCH: The patient is awake, follows , and level of  comprehension is unknown NEURO: no focal motor/sensory deficits other than known neuropathy   Labs:  CBC   Recent Labs Lab 06/16/14 1940 06/17/14 0435 06/18/14 0633 06/20/14 0538 06/22/14 0529  WBC 21.0* 20.1* 17.1* 19.0* 22.3*  HGB 8.8* 8.1* 8.1* 8.7* 8.9*  HCT 28.6* 25.7* 26.3* 27.6* 28.6*  PLT 323 319 284 297 331  MCV 82.4 82.4 83.8 81.9 83.6  MCH 25.4* 26.0 25.8* 25.8* 26.0  MCHC 30.8 31.5 30.8 31.5 31.1  RDW 20.1* 20.3* 19.8* 19.7* 19.9*  LYMPHSABS 2.7  --   --   --  3.6  MONOABS 1.1*  --   --   --  2.0*  EOSABS 1.3*  --   --   --  1.1*  BASOSABS 0.8*  --   --   --  2.5*     CMP    Recent Labs Lab 06/16/14 1940  06/17/14 0435 06/18/14 0633 06/20/14 0538 06/22/14 0529  NA 142 138 140 138 139  K 5.0 4.4 4.2 4.6 4.7  CL 111 109 109 105 109  CO2 $Re'20 24 26 27 24  'Qus$ GLUCOSE 105* 118* 95 98 115*  BUN 35* 30* 24* 24* 23  CREATININE 1.37* 1.22 1.18 1.20 1.05  CALCIUM 10.0 9.5 9.4 9.3 9.9  AST 22  --  $R'21 27 31  'mk$ ALT 11  --  $R'8 9 11  'HD$ ALKPHOS 97  --  78 87 84  BILITOT 0.4  --  0.5 0.5 0.6        Component Value Date/Time   BILITOT 0.6 06/22/2014 0529   BILITOT 0.35 11/16/2013 0847   BILIDIR 0.0 01/12/2013 1100      Anemia panel:  No results for input(s): VITAMINB12, FOLATE, FERRITIN, TIBC, IRON, RETICCTPCT in the last 72 hours.   Imaging Studies:  Dg Chest 2 View  06/16/2014   CLINICAL DATA:  Lower extremity pain and swelling.  EXAM: CHEST  2 VIEW  COMPARISON:  Prior radiographs dated 04/15/2014, 03/31/2014, 11 04/2014, 01/26/2014 and 12/09/2013  FINDINGS: There is unchanged right hemidiaphragm elevation. There is unchanged cardiomegaly and aortic tortuosity. Scattered airspace opacities are again evident in the central and basilar regions of both lungs, without significant interval change. Diffuse bony sclerosis is again evident and may relate to the described history of a myeloproliferative disease.  IMPRESSION: Multifocal patchy airspace opacities in the central and basilar regions bilaterally, present on numerous prior studies although it does appear to have been slowly worsening compared to older examinations. Chronic infectious,  inflammatory or neoplastic processes may be considered. No convincing acute findings are evident.   Electronically Signed   By: Ellery Plunk M.D.   On: 06/16/2014 22:51   Ct Chest Wo Contrast  06/21/2014   CLINICAL DATA:  Patient denies chest pain or shortness of breath. Cough.  EXAM: CT CHEST WITHOUT CONTRAST  TECHNIQUE: Multidetector CT imaging of the chest was performed following the standard protocol without IV contrast.  COMPARISON:  04/15/2014  FINDINGS:  Mediastinum: The heart size appears normal. There is no pericardial effusion identified. The trachea appears patent and is midline. Normal appearance of the esophagus. No pathologically enlarged mediastinal or hilar lymph nodes identified.  Lungs/Pleura: There is no pleural effusion identified. Mosaic attenuation pattern is identified in both lungs. Subsegmental scar versus platelike atelectasis noted with right middle lobe. No suspicious pulmonary nodule or mass.  Upper Abdomen: There is no suspicious liver abnormality. Stones and/or sludge noted within the dependent portion of the gallbladder. The spleen appears enlarged.  Musculoskeletal: Widespread sclerotic bone metastases are again identified.  IMPRESSION: 1. There is a mosaic attenuation pattern within both lungs. In the absence of any clinical suspicion for pulmonary embolus this is likely due to small airways disease. 2. Widespread sclerotic bone metastasis. 3. Splenomegaly.   Electronically Signed   By: Signa Kell M.D.   On: 06/21/2014 13:30   Dg Abd Portable 1v  06/21/2014   CLINICAL DATA:  Vomiting  EXAM: PORTABLE ABDOMEN - 1 VIEW  COMPARISON:  None.  FINDINGS: Diffuse bone sclerosis. As noted previously this could represent widespread metastatic disease or a myeloproliferative process in this patient with splenomegaly. No findings to suggest renal osteodystrophy. The bowel gas pattern is nonobstructed. No concerning intra-abdominal mass effect calcification.  IMPRESSION: 1. No evidence of bowel obstruction. 2. Diffuse skeletal sclerosis as seen with widespread metastatic disease or myeloproliferative disorder.   Electronically Signed   By: Tiburcio Pea M.D.   On: 06/21/2014 18:10       Assessment/Plan: 74 y.o. male admitted with   Leukocytosis, unspecified Peripheral blood smear from 11/16/13 showed the blast count increased from 4% to about 14% in a one-month period. 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 The patient has  trouble understanding the concept of leukemia. At the time he was informed that this is a life-threatening condition and would need bone marrow biopsy to find out the type of leukemia and to start treatment as soon as possible.  Unfortunately, we do not treat acute leukemia at [facility]. He was referred to [facility]. He was seen by Dr. Gretel Acre. Bone marrow biopsy was recommended but patient refused. Dr. 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 He had 3% blasts in the peripheral blood, which was confirmed by flow cytometry. Mr. Diana potentially could be a candidate for treatment with Jakafi; however, he decided not to discuss these issues and return to [place]. He has failed to follow up since then.  New peripheral blood smear has been ordered for review.  Hopefully patient will agree to proceed with therapeutic recommendations.  Anemia This is likely anemia of chronic disease Patient denies any recent history of bleeding such as epistaxis, hematuria or hematochezia asymptomatic for anemia Will observe for now  History of thrombocytosis Currently his platelet count is normal, but needs to be monitored in the setting of his history of leukemia, Infection, dilution, and polypharmacy No bleeding issues are noted  No transfusion is indicated at this time  DVT prophylaxis On Lovenox  Full code  Rondel Jumbo, PA-C 06/22/2014 8:25 AM  ADDENDUM:  This is a very tough case.  He does not have any insight into his problem.  He has been dealing with this issue for at least 10-12 months.  He has refused in the past to have the appropriate tests done.  Now he says that he will have a bone marrow bx done.  His blood smear is suggestive of a myeloid leukemia.  If he truly had Calreticulin (+) ET, he may have transformed into AML.  I told him  that if he has leukemia, we really have very little to offer him. He seems to be oblivious to this issue.  At least with a bone marrow bx, we can get cytogenetics which can help with prognosis.  I would think that palliative care would be a very reasonable approach for him.  However, I don't think he would agree to this just in our conversation today.  We will follow along and try to help.  This will be a challenge!!  Laurey Arrow

## 2014-06-22 NOTE — Evaluation (Signed)
Occupational Therapy Evaluation Patient Details Name: Juan [name] MRN: [id number] DOB: [birth year] Today's Date: 06/22/2014    History of Present Illness 74 y.o. male with a history of CAD, Diastolic CHF, AML,  HTN,  DM2 who presents to the ED with complaints of worsening BLE edema and difficulty walking as well as weakness and malaise and SOB.  Pt dx with HCAP and cellulitis of right leg. Pt with new diagnosis of CLL (Chronic Lymphocytic Leukemia)   Clinical Impression   Patient required assistance and was not walking (according to PT evaluation) PTA. Patient currently requires total assist for LB ADLs and min assist for functional stand-pivot transfers using RW. Patient will benefit from acute OT to increase overall independence in the areas of ADLs, functional mobility, dynamic standing balance/tolerance/endurance in order to safely discharge to venue listed below. Patient stated he was okay with transferring > SNF prior to home, he made comment "I can't go home right now". Patient also commented that his son was "sick".     Follow Up Recommendations  SNF;Supervision/Assistance - 24 hour    Equipment Recommendations  None recommended by OT    Recommendations for Other Services  None at this time.      Precautions / Restrictions Precautions Precautions: Fall Restrictions Weight Bearing Restrictions: No      Mobility Bed Mobility Overal bed mobility: Needs Assistance Bed Mobility: Rolling;Sidelying to Sit Rolling: Supervision Sidelying to sit: Supervision       General bed mobility comments: Using bed rails  Transfers   Equipment used: Rolling walker (2 wheeled) Transfers: Sit to/from [facility] Sit to Stand: Min assist Stand pivot transfers: Min assist       General transfer comment: No cues needed for hand placement, therapist raised bed for easier sit>stand transition     Balance Overall balance assessment: Needs assistance Sitting-balance support:  Feet supported;No upper extremity supported Sitting balance-Leahy Scale: Good     Standing balance support: Bilateral upper extremity supported Standing balance-Leahy Scale: Fair     ADL Overall ADL's : Needs assistance/impaired Eating/Feeding: Sitting;Set up   Grooming: Set up;Sitting   Upper Body Bathing: Set up;Sitting   Lower Body Bathing: Total assistance;Sit to/from stand;Cueing for safety   Upper Body Dressing : Set up;Sitting   Lower Body Dressing: Total assistance;Sit to/from stand;Cueing for safety   Toilet Transfer: RW;Stand-pivot;Minimal assistance;BSC   Toileting- Clothing Manipulation and Hygiene: Minimal assistance;Sit to/from stand;Cueing for safety       Functional mobility during ADLs: Minimal assistance;Rolling walker;Cueing for safety General ADL Comments: Patient with complaints from being sick last night. Patient with bag of personal items under gown and would not move those for transfer. Patient talking about possible new diagnosis of "blood cancer" with therapist. Patient with statement "I can't go home right now". Unable to cross BLEs for LB ADLs      Vision  No change from baseline   Perception Perception Perception Tested?: No   Praxis Praxis Praxis tested?: Within functional limits    Pertinent Vitals/Pain Pain Assessment: 0-10 Pain Score: 3  Pain Location: stomach Pain Descriptors / Indicators: Aching Pain Intervention(s): Monitored during session     Hand Dominance Right   Extremity/Trunk Assessment Upper Extremity Assessment Upper Extremity Assessment: Generalized weakness   Lower Extremity Assessment Lower Extremity Assessment: Defer to PT evaluation   Cervical / Trunk Assessment Cervical / Trunk Assessment: Normal   Communication Communication Communication: No difficulties   Cognition Arousal/Alertness: Awake/alert Behavior During Therapy: WFL for tasks assessed/performed Overall Cognitive  Status: No family/caregiver  present to determine baseline cognitive functioning       Memory: Decreased short-term memory              Home Living Family/patient expects to be discharged to:: Skilled nursing facility Living Arrangements: Children   Additional Comments: lives with son       Prior Functioning/Environment Level of Independence: Needs assistance  Gait / Transfers Assistance Needed: pt reports he has not been ambulatory for ~ 3 months ADL's / Homemaking Assistance Needed: total (A) since D/C from hospital         OT Diagnosis: Generalized weakness;Acute pain   OT Problem List: Decreased strength;Decreased activity tolerance;Impaired balance (sitting and/or standing);Decreased safety awareness;Decreased cognition;Decreased knowledge of use of DME or AE;Decreased knowledge of precautions;Pain   OT Treatment/Interventions: Self-care/ADL training;Therapeutic exercise;Energy conservation;DME and/or AE instruction;Therapeutic activities;Patient/family education;Balance training    OT Goals(Current goals can be found in the care plan section) Acute Rehab OT Goals Patient Stated Goal: "when will I have the tests" OT Goal Formulation: With patient Time For Goal Achievement: 07/06/14 Potential to Achieve Goals: Good ADL Goals Pt Will Perform Lower Body Bathing: with adaptive equipment;sit to/from stand;with min assist Pt Will Perform Lower Body Dressing: with adaptive equipment;sit to/from stand;with min assist Pt Will Transfer to Toilet: with supervision;bedside commode;ambulating Pt Will Perform Toileting - Clothing Manipulation and hygiene: with supervision;sit to/from stand Pt Will Perform Tub/Shower Transfer: with supervision;rolling walker;ambulating (using DME prn) Pt/caregiver will Perform Home Exercise Program: Both right and left upper extremity;With written HEP provided;With Supervision;Increased strength  OT Frequency: Min 2X/week   Barriers to D/C: Decreased caregiver  support  Patient reports his son assisted at home, but son is "sick".           End of Session Equipment Utilized During Treatment: Gait belt;Rolling walker  Activity Tolerance: Patient tolerated treatment well Patient left: in chair;with call Joerger/phone within reach;with chair alarm set   Time: [phone] OT Time Calculation (min): 23 min Charges:  OT General Charges $OT Visit: 1 Procedure OT Evaluation $Initial OT Evaluation Tier I: 1 Procedure OT Treatments $Therapeutic Activity: 8-22 mins   Emileo Semel , MS, OTR/L, CLT Pager: [phone]  06/22/2014, 9:03 AM

## 2014-06-22 NOTE — Telephone Encounter (Signed)
Called pt unable to leave message.

## 2014-06-22 NOTE — Clinical Social Work Note (Signed)
Patient has been refusing SNF/STR.  Patient is now stating he does not think he can go home and is agreeable for CSW to conduct SNF search.  Patient does not wish to return to [facility]. It is very likely that patient will have additional bed offers.  CSW will continue to follow for SNF/STR disposition assistance.  Bed offers will be presented once received.  Nonnie Done, [place] [phone]  Psychiatric & Orthopedics (5N 1-16) Clinical Social Worker

## 2014-06-22 NOTE — Progress Notes (Signed)
Physical Therapy Treatment Patient Details Name: Juan [name] MRN: [id number] DOB: Mar 09, 1941 Today's Date: 06/22/2014    History of Present Illness 74 y.o. male with a history of CAD, Diastolic CHF, AML,  HTN,  DM2 who presents to the ED with complaints of worsening BLE edema and difficulty walking as well as weakness and malaise and SOB.  Pt dx with HCAP and cellulitis of right leg. Pt with new diagnosis of CLL (Chronic Lymphocytic Leukemia)    PT Comments    Pt, despite afternoon nausea, was able to get up with therapy and walk with RW into the hallway.  He continues to present as a high fall risk and would benefit from SNF level rehab at discharge.  PT will continue to follow acutely.    Follow Up Recommendations  SNF     Equipment Recommendations  Rolling walker with 5" wheels    Recommendations for Other Services   NA     Precautions / Restrictions Precautions Precautions: Fall Precaution Comments: due to generalized weakness    Mobility  Bed Mobility Overal bed mobility: Needs Assistance Bed Mobility: Supine to Sit;Sit to Supine     Supine to sit: Supervision Sit to supine: Supervision   General bed mobility comments: Supervision for safety due to slow speed of transitions and heavy reliance on upper extremity support to pull up to sit.   Transfers Overall transfer level: Needs assistance Equipment used: Rolling walker (2 wheeled) Transfers: Sit to/from Stand Sit to Stand: +2 physical assistance;Mod assist;Min assist Stand pivot transfers: Min assist       General transfer comment: Two person min assist to stand from elevated bed, two person mod assist to stand from much lower recliner chair.  Pt needs verbal cues for safe hand placement and assist to support trunk and stabilize RW during transitions to stand over weak legs.   Ambulation/Gait Ambulation/Gait assistance: Min assist;+2 safety/equipment (chair to follow to encourage increased gait  distance/safety) Ambulation Distance (Feet): 90 Feet Assistive device: Rolling walker (2 wheeled) Gait Pattern/deviations: Step-through pattern;Shuffle;Trunk flexed Gait velocity: decreased Gait velocity interpretation: Below normal speed for age/gender General Gait Details: bil knees also more flexed during gait with one to two buckling moments.  He does not feel as well today as he did last therapy session, but was still able to make it the same distance with chair to follow as last session.           Balance Overall balance assessment: Needs assistance Sitting-balance support: Feet supported;No upper extremity supported Sitting balance-Leahy Scale: Good     Standing balance support: Bilateral upper extremity supported;Single extremity supported;No upper extremity supported Standing balance-Leahy Scale: Fair                      Cognition Arousal/Alertness: Awake/alert Behavior During Therapy: WFL for tasks assessed/performed Overall Cognitive Status: No family/caregiver present to determine baseline cognitive functioning                             Pertinent Vitals/Pain Pain Assessment: Faces Faces Pain Scale: No hurt           PT Goals (current goals can now be found in the care plan section) Acute Rehab PT Goals Patient Stated Goal: to get stronger, walk more over the weekend Progress towards PT goals: Progressing toward goals    Frequency  Min 3X/week    PT Plan Current plan remains appropriate  End of Session   Activity Tolerance: Patient limited by fatigue Patient left: in bed;with call Flaugher/phone within reach     Time: [phone] (only one unit charged due to pt talking) PT Time Calculation (min) (ACUTE ONLY): 28 min  Charges:  $Gait Training: 8-22 mins                      Tarin Navarez B. [place], [place], DPT [phone]   06/22/2014, 7:01 PM

## 2014-06-22 NOTE — Telephone Encounter (Signed)
New Message  Pt called he's at the Hospital, Legs are swollen unalble to walk.. They are giving him antibiotics.. He says that they are planning to move him to [place]. This is the number that he will be at if they discharge him [phone] [facility]) pt says that he wants Dr. Johnsie Cancel to come and see him. Informed pt that the Cardiologist at the hospital would have to make rounds for him. Pt wanted a message sent to Dr. Johnsie Cancel. Please assist. If possible

## 2014-06-22 NOTE — Clinical Documentation Improvement (Signed)
MD's, NP's, and PA's   Per nursing noted several days into admission, if appropriate for this admit please document if pressure ulcers were present on admit or not.  Thank you    Medicare rules require specification as to whether an inpatient diagnosis was present at the time of admission.    Please clarify if the following diagnosis  Stage 2 pressure ulcers to bilateral heels was:     Marland Kitchen Present at the time of admission . NOT present at the time of inpatient admission and it developed during the inpatient stay . Unable to clinically determine whether the condition was present on admission. . Documentation insufficient to determine if condition was present at the time of inpatient admission  Thank You, Ree Kida ,RN Clinical Documentation Specialist:  [facility]

## 2014-06-22 NOTE — Progress Notes (Signed)
PROGRESS NOTE  Juan [name] RAQ:[id number] DOB: 1940/07/24 DOA: 06/16/2014 PCP: [name], MD  HPI/Recap of past 24 hours: Poor historian,reported feeling better than yesterday, no active vomiting. Rn reported that patient seems to have bowel incontinence. patient's son [name], he reported patient has been having progressive weakness for the last several months, patient also has significant weight loss recently. Son reported possible about 70lbs weight loss.  Assessment/Plan: Principal Problem:   HCAP (healthcare-associated pneumonia) Active Problems:   Myeloproliferative disease   Essential hypertension   Chronic diastolic CHF (congestive heart failure)   Chronic lower extremity edema, stasis dermatitis   Type II diabetes mellitus   CKD (chronic kidney disease) stage 3, GFR 30-59 ml/min   Weakness generalized   Pain in limb   Peripheral edema   Unable to ambulate   Cellulitis of leg, right   Peripheral neuropathy  N/v, kub no obstruction, Ct chest showed hard stool in colon, bowel regimen. Prn reglan, antiemetics. No vomiting today. RN reported possible bowel incontinence, with weakness and ? Cancer, will get mri l spine to r/o cord compression.  Bilateral patchy infiltrate in lung, edema? Atypical infection? Sputum culture pending, will switch to doxycycline orally to minimize volume.   Bone metastasis? Myeloproliferative disease? Cbc with diff does have few blast cell reported. Patient is very poor historian? Although there is documented visit with oncology in the past, patient denies it, in denial? Son does not know the detail either, reported patient is not very forthcoming with medical problem in the past. oncology consulted, ct guided bone biopsy ordered by oncology.  Chronic diastolic chf. Continue lasix. Monitor volume status. No obvious lower extremity today on exam  FTT, bilateral heal pressure ulcer, dressing, offloading measures. PT/OT/SNF, talked to son, son agree with  snf. Patient currently state he think snf if a reasonable choice but would like to have biopsy done before being discharged to snf. Social worker consulted.  Generalized weakness, patient does not participate care actively. Passive range of motion, bed to chair tid ordered. Continue pt/ot/skin care.   Code Status: full  Family Communication: patient and son  Disposition Plan: snf   Consultants:  Oncology consulted on 2/5  Procedures:  CT guided bone biopsy early next week  Antibiotics:  Vanc/cefepime from admission to 2/4  Oral Doxycycline from 2/4 to   Objective: BP 113/66 mmHg  Pulse 67  Temp(Src) 98 F (36.7 C) (Oral)  Resp 18  Ht 6\' 5"  (1.956 m)  Wt 102.6 kg (226 lb 3.1 oz)  BMI 26.82 kg/m2  SpO2 99%  Intake/Output Summary (Last 24 hours) at 06/22/14 1923 Last data filed at 06/22/14 1730  Gross per 24 hour  Intake    960 ml  Output   1350 ml  Net   -390 ml   Filed Weights   06/17/14 0100 06/17/14 0145  Weight: 103.9 kg (229 lb 0.9 oz) 102.6 kg (226 lb 3.1 oz)    Exam:   General:  frail  Cardiovascular: rrr  Respiratory: diminised at bases, moderate air entry mid and upper lung fields, no wheezing/rhonchi/rales  Abdomen: soft, nt/nd, positive bs  Musculoskeletal: no edema, bilateral heal ulcer with dressing. able to lift leg up to 30degree against gravity.  Data Reviewed: Basic Metabolic Panel:  Recent Labs Lab 06/16/14 1940 06/17/14 0435 06/18/14 0633 06/20/14 0538 06/22/14 0529  NA 142 138 140 138 139  K 5.0 4.4 4.2 4.6 4.7  CL 111 109 109 105 109  CO2 20 24 26 27  24  GLUCOSE 105* 118* 95 98 115*  BUN 35* 30* 24* 24* 23  CREATININE 1.37* 1.22 1.18 1.20 1.05  CALCIUM 10.0 9.5 9.4 9.3 9.9   Liver Function Tests:  Recent Labs Lab 06/16/14 1940 06/18/14 0633 06/20/14 0538 06/22/14 0529  AST 22 21 27 31   ALT 11 8 9 11   ALKPHOS 97 78 87 84  BILITOT 0.4 0.5 0.5 0.6  PROT 6.6 5.8* 6.2 6.2  ALBUMIN 3.3* 2.7* 2.9* 3.0*     Recent Labs Lab 06/22/14 0529  LIPASE 21   No results for input(s): AMMONIA in the last 168 hours. CBC:  Recent Labs Lab 06/16/14 1940 06/17/14 0435 06/18/14 0633 06/20/14 0538 06/22/14 0529  WBC 21.0* 20.1* 17.1* 19.0* 22.3*  NEUTROABS 14.7*  --   --   --  12.7*  HGB 8.8* 8.1* 8.1* 8.7* 8.9*  HCT 28.6* 25.7* 26.3* 27.6* 28.6*  MCV 82.4 82.4 83.8 81.9 83.6  PLT 323 319 284 297 331   Cardiac Enzymes:    Recent Labs Lab 06/16/14 1940  TROPONINI <0.03   BNP (last 3 results)  Recent Labs  06/16/14 1940 06/20/14 0538  BNP 18.8 227.8*    ProBNP (last 3 results)  Recent Labs  01/25/14 1115 03/29/14 1549 04/14/14 2335  PROBNP 828.2* 6628.0* 4369.0*    CBG:  Recent Labs Lab 06/21/14 1638 06/21/14 2210 06/22/14 0651 06/22/14 1136 06/22/14 1639  GLUCAP 131* 114* 110* 104* 112*    Recent Results (from the past 240 hour(s))  Culture, expectorated sputum-assessment     Status: None   Collection Time: 06/22/14 11:40 AM  Result Value Ref Range Status   Specimen Description SPU  Final   Special Requests Immunocompromised  Final   Sputum evaluation   Final    THIS SPECIMEN IS ACCEPTABLE. RESPIRATORY CULTURE REPORT TO FOLLOW.   Report Status 06/22/2014 FINAL  Final     Studies: Ct Chest Wo Contrast  06/21/2014   CLINICAL DATA:  Patient denies chest pain or shortness of breath. Cough.  EXAM: CT CHEST WITHOUT CONTRAST  TECHNIQUE: Multidetector CT imaging of the chest was performed following the standard protocol without IV contrast.  COMPARISON:  04/15/2014  FINDINGS: Mediastinum: The heart size appears normal. There is no pericardial effusion identified. The trachea appears patent and is midline. Normal appearance of the esophagus. No pathologically enlarged mediastinal or hilar lymph nodes identified.  Lungs/Pleura: There is no pleural effusion identified. Mosaic attenuation pattern is identified in both lungs. Subsegmental scar versus platelike atelectasis  noted with right middle lobe. No suspicious pulmonary nodule or mass.  Upper Abdomen: There is no suspicious liver abnormality. Stones and/or sludge noted within the dependent portion of the gallbladder. The spleen appears enlarged.  Musculoskeletal: Widespread sclerotic bone metastases are again identified.  IMPRESSION: 1. There is a mosaic attenuation pattern within both lungs. In the absence of any clinical suspicion for pulmonary embolus this is likely due to small airways disease. 2. Widespread sclerotic bone metastasis. 3. Splenomegaly.   Electronically Signed   By: Kerby Moors M.D.   On: 06/21/2014 13:30   Dg Abd Portable 1v  06/21/2014   CLINICAL DATA:  Vomiting  EXAM: PORTABLE ABDOMEN - 1 VIEW  COMPARISON:  None.  FINDINGS: Diffuse bone sclerosis. As noted previously this could represent widespread metastatic disease or a myeloproliferative process in this patient with splenomegaly. No findings to suggest renal osteodystrophy. The bowel gas pattern is nonobstructed. No concerning intra-abdominal mass effect calcification.  IMPRESSION: 1. No evidence of bowel  obstruction. 2. Diffuse skeletal sclerosis as seen with widespread metastatic disease or myeloproliferative disorder.   Electronically Signed   By: Jorje Guild M.D.   On: 06/21/2014 18:10    Scheduled Meds: . aspirin  325 mg Oral Daily  . b complex vitamins  1 capsule Oral Daily  . doxycycline  100 mg Oral Q12H  . enoxaparin (LOVENOX) injection  40 mg Subcutaneous Q24H  . feeding supplement (GLUCERNA SHAKE)  237 mL Oral BID BM  . ferrous sulfate  325 mg Oral Q lunch  . fluticasone  2 spray Each Nare Daily  . folic acid  1 mg Oral Daily  . furosemide  40 mg Oral BID  . gabapentin  100 mg Oral TID  . guaiFENesin  1,200 mg Oral BID  . insulin aspart  0-5 Units Subcutaneous QHS  . insulin aspart  0-9 Units Subcutaneous TID WC  . metoprolol tartrate  25 mg Oral BID  . pantoprazole  40 mg Oral Daily  . potassium chloride SA  20 mEq  Oral Daily  . senna-docusate  1 tablet Oral QHS  . sodium chloride  3 mL Intravenous Q12H  . zolpidem  5 mg Oral QHS    Continuous Infusions: none    Mitzy Naron  [facility] Pager [phone]. If 7PM-7AM, please contact night-coverage at [email or web address], password [facility] 06/22/2014, 7:23 PM  LOS: 6 days

## 2014-06-23 LAB — GLUCOSE, CAPILLARY
GLUCOSE-CAPILLARY: 88 mg/dL (ref 70–99)
Glucose-Capillary: 85 mg/dL (ref 70–99)
Glucose-Capillary: 98 mg/dL (ref 70–99)

## 2014-06-23 NOTE — Progress Notes (Signed)
PROGRESS NOTE  Juan [name] TOI:[id number] DOB: 12-01-1940 DOA: 06/16/2014 PCP: [name], MD  HPI/Recap of past 24 hours: Patient is more active today, reported sitting in chair and walked today. Seems eager to engage with care today. patient's son Juan [name], he reported patient has been having progressive weakness for the last several months, patient also has significant weight loss recently. Son reported possible about 70lbs weight loss.  Assessment/Plan: Principal Problem:   HCAP (healthcare-associated pneumonia) Active Problems:   Myeloproliferative disease   Essential hypertension   Chronic diastolic CHF (congestive heart failure)   Chronic lower extremity edema, stasis dermatitis   Type II diabetes mellitus   CKD (chronic kidney disease) stage 3, GFR 30-59 ml/min   Weakness generalized   Pain in limb   Peripheral edema   Unable to ambulate   Cellulitis of leg, right   Peripheral neuropathy  N/v, resolved.   kub no obstruction, Ct chest showed hard stool in colon, bowel regimen. Prn reglan, antiemetics.   RN reported possible bowel incontinence, with weakness and ? Cancer, mri l spine no cord compression.  Bilateral patchy infiltrate in lung, edema? Atypical infection? Sputum culture pending,  switched to doxycycline orally on 2/4 to minimize volume.   Bone metastasis? Myeloproliferative disease? Cbc with diff does have few blast cell reported. Patient is very poor historian? Although there is documented visit with oncology in the past, patient denies it, in denial? Son does not know the detail either, reported patient is not very forthcoming with medical problem in the past. oncology consulted, ct guided bone biopsy ordered by oncology.  Chronic diastolic chf. Continue lasix. Monitor volume status. No obvious lower extremity today on exam  FTT, bilateral heal pressure ulcer, dressing, offloading measures. PT/OT/SNF, talked to son, son agree with snf. Patient currently state he  think snf if a reasonable choice but would like to have biopsy done before being discharged to snf. Social worker consulted.  Generalized weakness, patient does not participate care actively. Passive range of motion, ambulation, bed to chair tid ordered. Continue pt/ot/skin care.   Code Status: full  Family Communication: patient, no family member today  Disposition Plan: snf   Consultants:  Oncology consulted on 2/5  Procedures:  CT guided bone biopsy early next week  Antibiotics:  Vanc/cefepime from admission to 2/4  Oral Doxycycline from 2/4 to   Objective: BP 105/51 mmHg  Pulse 56  Temp(Src) 97.7 F (36.5 C) (Oral)  Resp 18  Ht 6\' 5"  (1.956 m)  Wt 102.6 kg (226 lb 3.1 oz)  BMI 26.82 kg/m2  SpO2 99%  Intake/Output Summary (Last 24 hours) at 06/23/14 1643 Last data filed at 06/23/14 1144  Gross per 24 hour  Intake    760 ml  Output   1350 ml  Net   -590 ml   Filed Weights   06/17/14 0100 06/17/14 0145  Weight: 103.9 kg (229 lb 0.9 oz) 102.6 kg (226 lb 3.1 oz)    Exam:   General:  frail  Cardiovascular: rrr  Respiratory: diminised at bases, moderate air entry mid and upper lung fields, no wheezing/rhonchi/rales  Abdomen: soft, nt/nd, positive bs  Musculoskeletal: no edema, bilateral heal ulcer with dressing. able to lift leg up to 30degree against gravity.  Data Reviewed: Basic Metabolic Panel:  Recent Labs Lab 06/16/14 1940 06/17/14 0435 06/18/14 0633 06/20/14 0538 06/22/14 0529  NA 142 138 140 138 139  K 5.0 4.4 4.2 4.6 4.7  CL 111 109 109 105 109  CO2  20 24 26 27 24   GLUCOSE 105* 118* 95 98 115*  BUN 35* 30* 24* 24* 23  CREATININE 1.37* 1.22 1.18 1.20 1.05  CALCIUM 10.0 9.5 9.4 9.3 9.9   Liver Function Tests:  Recent Labs Lab 06/16/14 1940 06/18/14 0633 06/20/14 0538 06/22/14 0529  AST 22 21 27 31   ALT 11 8 9 11   ALKPHOS 97 78 87 84  BILITOT 0.4 0.5 0.5 0.6  PROT 6.6 5.8* 6.2 6.2  ALBUMIN 3.3* 2.7* 2.9* 3.0*     Recent Labs Lab 06/22/14 0529  LIPASE 21   No results for input(s): AMMONIA in the last 168 hours. CBC:  Recent Labs Lab 06/16/14 1940 06/17/14 0435 06/18/14 0633 06/20/14 0538 06/22/14 0529  WBC 21.0* 20.1* 17.1* 19.0* 22.3*  NEUTROABS 14.7*  --   --   --  12.7*  HGB 8.8* 8.1* 8.1* 8.7* 8.9*  HCT 28.6* 25.7* 26.3* 27.6* 28.6*  MCV 82.4 82.4 83.8 81.9 83.6  PLT 323 319 284 297 331   Cardiac Enzymes:    Recent Labs Lab 06/16/14 1940  TROPONINI <0.03   BNP (last 3 results)  Recent Labs  06/16/14 1940 06/20/14 0538  BNP 18.8 227.8*    ProBNP (last 3 results)  Recent Labs  01/25/14 1115 03/29/14 1549 04/14/14 2335  PROBNP 828.2* 6628.0* 4369.0*    CBG:  Recent Labs Lab 06/22/14 0651 06/22/14 1136 06/22/14 1639 06/22/14 2216 06/23/14 0645  GLUCAP 110* 104* 112* 261* 88    Recent Results (from the past 240 hour(s))  Culture, expectorated sputum-assessment     Status: None   Collection Time: 06/22/14 11:40 AM  Result Value Ref Range Status   Specimen Description SPU  Final   Special Requests Immunocompromised  Final   Sputum evaluation   Final    THIS SPECIMEN IS ACCEPTABLE. RESPIRATORY CULTURE REPORT TO FOLLOW.   Report Status 06/22/2014 FINAL  Final  Culture, respiratory (NON-Expectorated)     Status: None (Preliminary result)   Collection Time: 06/22/14 11:40 AM  Result Value Ref Range Status   Specimen Description SPUTUM  Final   Special Requests NONE  Final   Gram Stain PENDING  Incomplete   Culture   Final    Culture reincubated for better growth Performed at [facility]    Report Status PENDING  Incomplete     Studies: Mr Lumbar Spine Wo Contrast  06/23/2014   CLINICAL DATA:  Initial evaluation for low back pain, incontinence.  EXAM: MRI LUMBAR SPINE WITHOUT CONTRAST  TECHNIQUE: Multiplanar, multisequence MR imaging of the lumbar spine was performed. No intravenous contrast was administered.  COMPARISON:  None.   FINDINGS: For the purposes of this dictation, the lowest well-formed intervertebral disc spaces presumed to be the L5-S1 level, and there presumed to be 5 lumbar type vertebral bodies.  Vertebral bodies are normally aligned with preservation of the normal lumbar lordosis. Vertebral body heights are preserved. No acute fracture listhesis. Mild dextroscoliosis noted centered at L2-3.  Signal intensity within the visualized bone marrow is somewhat patchy and heterogeneous without focal osseous lesion. This may be related to underlying osteoporosis.  Conus medullaris terminates normally at the L2 level. Signal intensity within the visualized cord is normal. Nerve roots of the cauda equina are unremarkable.  Paraspinous soft tissues within normal limits. Fatty atrophy noted within the paraspinous musculature. T2 hyperintense exophytic cyst noted at the inferior pole the right kidney. No retroperitoneal adenopathy.  At T11-12, T12-L1, and L1-2, there is no disc bulge  or disc protrusion. Intervertebral discs well hydrated. No significant facet arthropathy. No canal or foraminal stenosis.  L2-3: Mild diffuse disc bulge with disc desiccation. No focal disc herniation. Mild bilateral facet arthrosis. No significant canal or foraminal stenosis.  L3-4: Minimal disc bulge with disc desiccation and mild intervertebral disc space narrowing. Left greater than right facet arthrosis. No significant canal or foraminal stenosis.  L4-5: Disc bulge with disc desiccation and intervertebral disc space narrowing. Degenerative endplate changes present. There is a superimposed right foraminal/far lateral shallow disc protrusion (series 700, image 33). No associated nerve root impingement. Moderate bilateral facet arthrosis present, worse on the right. There is mild canal and bilateral lateral recess stenosis, largely due to facet disease. Mild bilateral foraminal narrowing present, slightly worse on the right.  L5-S1: No disc bulge or disc  protrusion. Right greater than left facet arthrosis. No canal or foraminal stenosis.  IMPRESSION: 1. No canal significant canal stenosis identified within the lumbar spine. 2. Mild disc bulge with superimposed right foraminal disc protrusion at L4-5 without neural impingement. These changes superimposed on facet arthropathy at this level result in mild canal and bilateral foraminal narrowing, slightly worse on the right. 3. Mild degenerative disc bulge with facet arthropathy at L2-3 and L3-4 without significant stenosis.   Electronically Signed   By: Jeannine Boga M.D.   On: 06/23/2014 06:58    Scheduled Meds: . aspirin  325 mg Oral Daily  . b complex vitamins  1 capsule Oral Daily  . doxycycline  100 mg Oral Q12H  . enoxaparin (LOVENOX) injection  40 mg Subcutaneous Q24H  . feeding supplement (GLUCERNA SHAKE)  237 mL Oral BID BM  . ferrous sulfate  325 mg Oral Q lunch  . fluticasone  2 spray Each Nare Daily  . folic acid  1 mg Oral Daily  . furosemide  40 mg Oral BID  . gabapentin  100 mg Oral TID  . guaiFENesin  1,200 mg Oral BID  . insulin aspart  0-5 Units Subcutaneous QHS  . insulin aspart  0-9 Units Subcutaneous TID WC  . metoprolol tartrate  25 mg Oral BID  . pantoprazole  40 mg Oral Daily  . potassium chloride SA  20 mEq Oral Daily  . senna-docusate  1 tablet Oral QHS  . sodium chloride  3 mL Intravenous Q12H  . zolpidem  5 mg Oral QHS    Continuous Infusions: none    Hakop Humbarger  [facility] Pager [phone]. If 7PM-7AM, please contact night-coverage at [email or web address], password [facility] 06/23/2014, 4:43 PM  LOS: 7 days

## 2014-06-23 NOTE — Clinical Social Work Note (Signed)
CSW met with patient and discussed d/c plan. Patient states he plans to return to [place]. Patient had several questions about his condition and wanted to make sure he received all results from tests and meet with MD prior to his d/c. Patient reports he wants to know what is going on with him before he goes back to [place]. CSW acknowledged patient's statements and made patient aware staff will provide him with updates as results of tests come in. CSW to follow tomorrow.  [place], [place] Weekend Clinical Social Worker [phone]

## 2014-06-24 ENCOUNTER — Encounter (HOSPITAL_COMMUNITY): Payer: Self-pay | Admitting: Radiology

## 2014-06-24 LAB — GLUCOSE, CAPILLARY
GLUCOSE-CAPILLARY: 123 mg/dL — AB (ref 70–99)
Glucose-Capillary: 90 mg/dL (ref 70–99)
Glucose-Capillary: 91 mg/dL (ref 70–99)
Glucose-Capillary: 99 mg/dL (ref 70–99)

## 2014-06-24 LAB — CULTURE, RESPIRATORY W GRAM STAIN

## 2014-06-24 LAB — CULTURE, RESPIRATORY: CULTURE: NORMAL

## 2014-06-24 NOTE — H&P (Signed)
Reason for Consult: Anemia Chief Complaint: Chief Complaint  Patient presents with  . Leg Swelling   Referring Physician(s): Oncology  History of Present Illness: Juan [name] is a 74 y.o. 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 IR received request for bone marrow biopsy. He denies any chest pain, shortness of breath or palpitations. He denies any active signs of bleeding or excessive bruising. He denies any recent fever or chills. The patient denies any history of sleep apnea or chronic oxygen use. He has no known complications to sedation.   Past Medical History  Diagnosis Date  . CAD (coronary artery disease)     dx elsewheer in past, no documentation. Non-ischemic myovue 2007  . CHF (congestive heart failure)     dx elsewhere, no documentation. Normal EF by previous echo. Trival AS needs f/u ECHO by 2012  . Hypertension   . Sleep apnea, obstructive     at some point used CPAP, was d/c  years ago  . Anemia   . History of thrombocytosis   . Allergic rhinitis   . Edema     R>L leg, u/s 5-12 neg for DVT  . Hemorrhoid   . Type II diabetes mellitus   . Sinus congestion   . Migraine     "once/wk at least" (07/11/2013)  . Shortness of breath   . Myeloproliferative disease   . Ascending aortic aneurysm 03/2014    4.3cm on CT scan    Past Surgical History  Procedure Laterality Date  . Toe surgery Right     "tried to straighten out big toe" (07/11/2013)    Allergies: Review of patient's allergies indicates no known allergies.  Medications: Prior to Admission medications   Medication Sig Start Date End Date Taking? Authorizing Provider  acetaminophen (TYLENOL) 500 MG tablet Take 500-1,000 mg by mouth every 6 (six) hours as needed for moderate pain or headache.    Yes Historical Provider, MD  aspirin 325 MG tablet Take 1 tablet (325 mg total) by mouth daily. 04/16/14   Yes Ripudeep K Rai, MD  b complex vitamins capsule Take 1 capsule by mouth daily. 01/12/13  Yes Irene Pap, NP  feeding supplement, GLUCERNA SHAKE, (GLUCERNA SHAKE) LIQD Take 237 mLs by mouth 2 (two) times daily between meals. 04/10/14  Yes Barton Dubois, MD  Ferrous Sulfate Dried 200 (65 FE) MG TABS Take 1 tablet by mouth daily.    Yes Historical Provider, MD  fluticasone (FLONASE) 50 MCG/ACT nasal spray Place 2 sprays into both nostrils daily.    Yes Historical Provider, MD  folic acid (FOLVITE) 1 MG tablet Take 1 mg by mouth daily.    Yes Historical Provider, MD  furosemide (LASIX) 40 MG tablet Take 1 tablet (40 mg total) by mouth 2 (two) times daily. 04/10/14  Yes Barton Dubois, MD  hydrocerin (EUCERIN) CREA Apply Eucerin cream to BLE Q day after bathing and roughly towel drying to remove loose skin 01/27/14  Yes Shanker Kristeen Mans, MD  HYDROcodone-acetaminophen (NORCO/VICODIN) 5-325 MG per tablet Take 1 tablet by mouth every 6 (six) hours as needed for moderate pain. 04/16/14  Yes Ripudeep Krystal Eaton, MD  metoprolol tartrate (LOPRESSOR) 25 MG tablet Take 1 tablet (25 mg total) by mouth 2 (two) times daily. 04/16/14  Yes Ripudeep Krystal Eaton, MD  pantoprazole (PROTONIX) 40 MG tablet Take 1 tablet (40 mg total) by mouth daily. 04/10/14  Yes Barton Dubois, MD  potassium chloride SA (K-DUR,KLOR-CON) 20 MEQ tablet Take 20 mEq by mouth daily.   Yes Historical Provider, MD  sodium chloride (OCEAN) 0.65 % SOLN nasal spray Place 1-2 sprays into both nostrils every 4 (four) hours as needed for congestion. 04/10/14  Yes Barton Dubois, MD  zolpidem (AMBIEN) 5 MG tablet Take 1 tablet (5 mg total) by mouth at bedtime. 05/14/14  Yes Lauree Chandler, NP  gabapentin (NEURONTIN) 100 MG capsule Take 1 capsule (100 mg total) by mouth 3 (three) times daily. 06/20/14   Debbe Odea, MD  senna-docusate (SENOKOT-S) 8.6-50 MG per tablet Take 1 tablet by mouth at bedtime. 06/20/14   Debbe Odea, MD    Family History  Problem  Relation Age of Onset  . Colon cancer Neg Hx   . Prostate cancer Neg Hx   . Heart attack Neg Hx   . Diabetes Neg Hx   . Schizophrenia Son     History   Social History  . Marital Status: Widowed    Spouse Name: N/A    Number of Children: 2  . Years of Education: N/A   Occupational History  . retired    .     Social History Main Topics  . Smoking status: Former Smoker -- 0.25 packs/day for 12 years    Types: Cigarettes    Quit date: 05/18/1966  . Smokeless tobacco: Never Used  . Alcohol Use: No  . Drug Use: No  . Sexual Activity: Yes   Other Topics Concern  . None   Social History Narrative   Moved from [place] 2006   Widow 2007   Son lives with him    Review of Systems: A 12 point ROS discussed and pertinent positives are indicated in the HPI above.  All other systems are negative.  Review of Systems  Vital Signs: BP 128/61 mmHg  Pulse 55  Temp(Src) 97.5 F (36.4 C) (Oral)  Resp 17  Ht _0  (1.956 m)  Wt 226 lb 3.1 oz (102.6 kg)  BMI 26.82 kg/m2  SpO2 96%  Physical Exam  Constitutional: He is oriented to person, place, and time. No distress.  Cardiovascular: Normal rate and regular rhythm.  Exam reveals no gallop and no friction rub.   No murmur heard. Pulmonary/Chest: Effort normal and breath sounds normal. No respiratory distress. He has no wheezes. He has no rales.  Neurological: He is alert and oriented to person, place, and time.  Skin: He is not diaphoretic.  No hip/pelvic site skin breakdown    Imaging: Dg Chest 2 View  06/16/2014   CLINICAL DATA:  Lower extremity pain and swelling.  EXAM: CHEST  2 VIEW  COMPARISON:  Prior radiographs dated 04/15/2014, 03/31/2014, 11 04/2014, 01/26/2014 and 12/09/2013  FINDINGS: There is unchanged right hemidiaphragm elevation. There is unchanged cardiomegaly and aortic tortuosity. Scattered airspace opacities are again evident in the central and basilar regions of both lungs, without significant interval change.  Diffuse bony sclerosis is again evident and may relate to the described history of a myeloproliferative disease.  IMPRESSION: Multifocal patchy airspace opacities in the central and basilar regions bilaterally, present on numerous prior studies although it does appear to have been slowly worsening compared to older examinations. Chronic infectious, inflammatory or neoplastic processes may be considered. No convincing acute findings are evident.   Electronically Signed   By: Andreas Newport M.D.   On: 06/16/2014 22:51   Ct Chest Wo Contrast  06/21/2014   CLINICAL  DATA:  Patient denies chest pain or shortness of breath. Cough.  EXAM: CT CHEST WITHOUT CONTRAST  TECHNIQUE: Multidetector CT imaging of the chest was performed following the standard protocol without IV contrast.  COMPARISON:  04/15/2014  FINDINGS: Mediastinum: The heart size appears normal. There is no pericardial effusion identified. The trachea appears patent and is midline. Normal appearance of the esophagus. No pathologically enlarged mediastinal or hilar lymph nodes identified.  Lungs/Pleura: There is no pleural effusion identified. Mosaic attenuation pattern is identified in both lungs. Subsegmental scar versus platelike atelectasis noted with right middle lobe. No suspicious pulmonary nodule or mass.  Upper Abdomen: There is no suspicious liver abnormality. Stones and/or sludge noted within the dependent portion of the gallbladder. The spleen appears enlarged.  Musculoskeletal: Widespread sclerotic bone metastases are again identified.  IMPRESSION: 1. There is a mosaic attenuation pattern within both lungs. In the absence of any clinical suspicion for pulmonary embolus this is likely due to small airways disease. 2. Widespread sclerotic bone metastasis. 3. Splenomegaly.   Electronically Signed   By: Kerby Moors M.D.   On: 06/21/2014 13:30   Mr Lumbar Spine Wo Contrast  06/23/2014   CLINICAL DATA:  Initial evaluation for low back pain,  incontinence.  EXAM: MRI LUMBAR SPINE WITHOUT CONTRAST  TECHNIQUE: Multiplanar, multisequence MR imaging of the lumbar spine was performed. No intravenous contrast was administered.  COMPARISON:  None.  FINDINGS: For the purposes of this dictation, the lowest well-formed intervertebral disc spaces presumed to be the L5-S1 level, and there presumed to be 5 lumbar type vertebral bodies.  Vertebral bodies are normally aligned with preservation of the normal lumbar lordosis. Vertebral body heights are preserved. No acute fracture listhesis. Mild dextroscoliosis noted centered at L2-3.  Signal intensity within the visualized bone marrow is somewhat patchy and heterogeneous without focal osseous lesion. This may be related to underlying osteoporosis.  Conus medullaris terminates normally at the L2 level. Signal intensity within the visualized cord is normal. Nerve roots of the cauda equina are unremarkable.  Paraspinous soft tissues within normal limits. Fatty atrophy noted within the paraspinous musculature. T2 hyperintense exophytic cyst noted at the inferior pole the right kidney. No retroperitoneal adenopathy.  At T11-12, T12-L1, and L1-2, there is no disc bulge or disc protrusion. Intervertebral discs well hydrated. No significant facet arthropathy. No canal or foraminal stenosis.  L2-3: Mild diffuse disc bulge with disc desiccation. No focal disc herniation. Mild bilateral facet arthrosis. No significant canal or foraminal stenosis.  L3-4: Minimal disc bulge with disc desiccation and mild intervertebral disc space narrowing. Left greater than right facet arthrosis. No significant canal or foraminal stenosis.  L4-5: Disc bulge with disc desiccation and intervertebral disc space narrowing. Degenerative endplate changes present. There is a superimposed right foraminal/far lateral shallow disc protrusion (series 700, image 33). No associated nerve root impingement. Moderate bilateral facet arthrosis present, worse on  the right. There is mild canal and bilateral lateral recess stenosis, largely due to facet disease. Mild bilateral foraminal narrowing present, slightly worse on the right.  L5-S1: No disc bulge or disc protrusion. Right greater than left facet arthrosis. No canal or foraminal stenosis.  IMPRESSION: 1. No canal significant canal stenosis identified within the lumbar spine. 2. Mild disc bulge with superimposed right foraminal disc protrusion at L4-5 without neural impingement. These changes superimposed on facet arthropathy at this level result in mild canal and bilateral foraminal narrowing, slightly worse on the right. 3. Mild degenerative disc bulge with facet arthropathy at  L2-3 and L3-4 without significant stenosis.   Electronically Signed   By: Jeannine Boga M.D.   On: 06/23/2014 06:58   Dg Abd Portable 1v  06/21/2014   CLINICAL DATA:  Vomiting  EXAM: PORTABLE ABDOMEN - 1 VIEW  COMPARISON:  None.  FINDINGS: Diffuse bone sclerosis. As noted previously this could represent widespread metastatic disease or a myeloproliferative process in this patient with splenomegaly. No findings to suggest renal osteodystrophy. The bowel gas pattern is nonobstructed. No concerning intra-abdominal mass effect calcification.  IMPRESSION: 1. No evidence of bowel obstruction. 2. Diffuse skeletal sclerosis as seen with widespread metastatic disease or myeloproliferative disorder.   Electronically Signed   By: Jorje Guild M.D.   On: 06/21/2014 18:10    Labs:  CBC:  Recent Labs  06/17/14 0435 06/18/14 0633 06/20/14 0538 06/22/14 0529  WBC 20.1* 17.1* 19.0* 22.3*  HGB 8.1* 8.1* 8.7* 8.9*  HCT 25.7* 26.3* 27.6* 28.6*  PLT 319 284 297 331    COAGS:  Recent Labs  12/09/13 0751  INR 1.22    BMP:  Recent Labs  06/17/14 0435 06/18/14 0633 06/20/14 0538 06/22/14 0529  NA 138 140 138 139  K 4.4 4.2 4.6 4.7  CL 109 109 105 109  CO2 _0 GLUCOSE 118* 95 98 115*  BUN 30* 24* 24* 23    CALCIUM 9.5 9.4 9.3 9.9  CREATININE 1.22 1.18 1.20 1.05  GFRNONAA 57* 60* 59* 69*  GFRAA 67* 69* 68* 80*    LIVER FUNCTION TESTS:  Recent Labs  06/16/14 1940 06/18/14 0633 06/20/14 0538 06/22/14 0529  BILITOT 0.4 0.5 0.5 0.6  AST _1 ALT _2 ALKPHOS 97 78 87 84  PROT 6.6 5.8* 6.2 6.2  ALBUMIN 3.3* 2.7* 2.9* 3.0*    Assessment and Plan: Progressive weakness Leukocytosis Anemia Request for image guided bone marrow biopsy with moderate sedation Patient to be NPO after midnight, CBC ordered Risks and Benefits discussed with the patient. All of the patient's questions were answered, patient is undecided if he would like to have the procedure and will let his RN know first thing 2/8 and sign consent on floor.   Thank you for this interesting consult.  I greatly enjoyed meeting Juan [name] and look forward to participating in their care.  SignedHedy Jacob 06/24/2014, 10:12 AM   I spent a total of 20 minutes face to face in clinical consultation, greater than 50% of which was counseling/coordinating care

## 2014-06-24 NOTE — Progress Notes (Signed)
PROGRESS NOTE  Juan [name] ZDG:[id number] DOB: 03-08-1941 DOA: 06/16/2014 PCP: [name], MD  HPI/Recap of past 24 hours: Radiology PA at bedside discuss biopsy procedure with patient. Patient still in denial,  Speech repetitive and make conflicting statement.  patient's son Juan [name], he reported patient has been having progressive weakness for the last several months, patient also has significant weight loss recently. Son reported possible about 70lbs weight loss.  Assessment/Plan: Principal Problem:   HCAP (healthcare-associated pneumonia) Active Problems:   Myeloproliferative disease   Essential hypertension   Chronic diastolic CHF (congestive heart failure)   Chronic lower extremity edema, stasis dermatitis   Type II diabetes mellitus   CKD (chronic kidney disease) stage 3, GFR 30-59 ml/min   Weakness generalized   Pain in limb   Peripheral edema   Unable to ambulate   Cellulitis of leg, right   Peripheral neuropathy  Bone metastasis? Myeloproliferative disease? Cbc with diff does have few blast cell reported. Patient is very poor historian? in denial? Son does not know the detail either, reported patient is not very forthcoming with medical problem in the past. oncology consulted, ct guided bone biopsy ordered by oncology.  Bilateral patchy infiltrate in lung, edema? Atypical infection? Sputum culture pending,  switched to doxycycline orally on 2/4 to minimize volume.   Chronic diastolic chf. Continue lasix. Monitor volume status. No obvious lower extremity today on exam N/v, resolved.    kub no obstruction, Ct chest showed hard stool in colon, bowel regimen. Prn reglan, antiemetics.   RN reported possible bowel incontinence, with weakness and ? Cancer, mri l spine no cord compression.  Generalized weakness, patient does not participate care actively. Passive range of motion, ambulation, bed to chair tid ordered. Continue pt/ot/skin care.  FTT, bilateral heal pressure  ulcer, dressing, offloading measures. PT/OT/SNF, talked to son, son agree with snf. Patient currently state he think snf if a reasonable choice but would like to have biopsy done before being discharged to snf. Social worker consulted.    Code Status: full  Family Communication: patient, no family member today  Disposition Plan: snf   Consultants:  Oncology consulted on 2/5  Procedures:  CT guided bone biopsy early next week  Antibiotics:  Vanc/cefepime from admission to 2/4  Oral Doxycycline from 2/4 to   Objective: BP 128/61 mmHg  Pulse 55  Temp(Src) 97.5 F (36.4 C) (Oral)  Resp 17  Ht 6\' 5"  (1.956 m)  Wt 102.6 kg (226 lb 3.1 oz)  BMI 26.82 kg/m2  SpO2 96%  Intake/Output Summary (Last 24 hours) at 06/24/14 0942 Last data filed at 06/24/14 0740  Gross per 24 hour  Intake    650 ml  Output    800 ml  Net   -150 ml   Filed Weights   06/17/14 0100 06/17/14 0145  Weight: 103.9 kg (229 lb 0.9 oz) 102.6 kg (226 lb 3.1 oz)    Exam:   General:  frail  Cardiovascular: rrr  Respiratory: diminised at bases, moderate air entry mid and upper lung fields, no wheezing/rhonchi/rales  Abdomen: soft, nt/nd, positive bs  Musculoskeletal: no edema, bilateral heal ulcer with dressing. able to lift leg up to 30degree against gravity.  Data Reviewed: Basic Metabolic Panel:  Recent Labs Lab 06/18/14 0633 06/20/14 0538 06/22/14 0529  NA 140 138 139  K 4.2 4.6 4.7  CL 109 105 109  CO2 26 27 24   GLUCOSE 95 98 115*  BUN 24* 24* 23  CREATININE 1.18 1.20 1.05  CALCIUM 9.4 9.3 9.9   Liver Function Tests:  Recent Labs Lab 06/18/14 0867 06/20/14 0538 06/22/14 0529  AST 21 27 31   ALT 8 9 11   ALKPHOS 78 87 84  BILITOT 0.5 0.5 0.6  PROT 5.8* 6.2 6.2  ALBUMIN 2.7* 2.9* 3.0*    Recent Labs Lab 06/22/14 0529  LIPASE 21   No results for input(s): AMMONIA in the last 168 hours. CBC:  Recent Labs Lab 06/18/14 0633 06/20/14 0538 06/22/14 0529  WBC  17.1* 19.0* 22.3*  NEUTROABS  --   --  12.7*  HGB 8.1* 8.7* 8.9*  HCT 26.3* 27.6* 28.6*  MCV 83.8 81.9 83.6  PLT 284 297 331   Cardiac Enzymes:   No results for input(s): CKTOTAL, CKMB, CKMBINDEX, TROPONINI in the last 168 hours. BNP (last 3 results)  Recent Labs  06/16/14 1940 06/20/14 0538  BNP 18.8 227.8*    ProBNP (last 3 results)  Recent Labs  01/25/14 1115 03/29/14 1549 04/14/14 2335  PROBNP 828.2* 6628.0* 4369.0*    CBG:  Recent Labs Lab 06/22/14 2216 06/23/14 0645 06/23/14 1644 06/23/14 2145 06/24/14 0645  GLUCAP 261* 88 85 98 90    Recent Results (from the past 240 hour(s))  Culture, expectorated sputum-assessment     Status: None   Collection Time: 06/22/14 11:40 AM  Result Value Ref Range Status   Specimen Description SPU  Final   Special Requests Immunocompromised  Final   Sputum evaluation   Final    THIS SPECIMEN IS ACCEPTABLE. RESPIRATORY CULTURE REPORT TO FOLLOW.   Report Status 06/22/2014 FINAL  Final  Culture, respiratory (NON-Expectorated)     Status: None (Preliminary result)   Collection Time: 06/22/14 11:40 AM  Result Value Ref Range Status   Specimen Description SPUTUM  Final   Special Requests NONE  Final   Gram Stain PENDING  Incomplete   Culture   Final    Culture reincubated for better growth Performed at [facility]    Report Status PENDING  Incomplete     Studies: No results found.  Scheduled Meds: . aspirin  325 mg Oral Daily  . b complex vitamins  1 capsule Oral Daily  . doxycycline  100 mg Oral Q12H  . enoxaparin (LOVENOX) injection  40 mg Subcutaneous Q24H  . feeding supplement (GLUCERNA SHAKE)  237 mL Oral BID BM  . ferrous sulfate  325 mg Oral Q lunch  . fluticasone  2 spray Each Nare Daily  . folic acid  1 mg Oral Daily  . furosemide  40 mg Oral BID  . gabapentin  100 mg Oral TID  . guaiFENesin  1,200 mg Oral BID  . insulin aspart  0-9 Units Subcutaneous TID WC  . metoprolol tartrate  25 mg Oral  BID  . pantoprazole  40 mg Oral Daily  . potassium chloride SA  20 mEq Oral Daily  . senna-docusate  1 tablet Oral QHS  . sodium chloride  3 mL Intravenous Q12H  . zolpidem  5 mg Oral QHS    Continuous Infusions: none    Kissy Cielo  [facility] Pager [phone]. If 7PM-7AM, please contact night-coverage at [email or web address], password [facility] 06/24/2014, 9:42 AM  LOS: 8 days

## 2014-06-25 DIAGNOSIS — R1310 Dysphagia, unspecified: Secondary | ICD-10-CM | POA: Diagnosis not present

## 2014-06-25 DIAGNOSIS — N390 Urinary tract infection, site not specified: Secondary | ICD-10-CM | POA: Diagnosis not present

## 2014-06-25 DIAGNOSIS — I5020 Unspecified systolic (congestive) heart failure: Secondary | ICD-10-CM | POA: Diagnosis not present

## 2014-06-25 DIAGNOSIS — D649 Anemia, unspecified: Secondary | ICD-10-CM | POA: Diagnosis not present

## 2014-06-25 DIAGNOSIS — R279 Unspecified lack of coordination: Secondary | ICD-10-CM | POA: Diagnosis not present

## 2014-06-25 DIAGNOSIS — M6281 Muscle weakness (generalized): Secondary | ICD-10-CM | POA: Diagnosis not present

## 2014-06-25 DIAGNOSIS — E1140 Type 2 diabetes mellitus with diabetic neuropathy, unspecified: Secondary | ICD-10-CM | POA: Diagnosis not present

## 2014-06-25 DIAGNOSIS — E569 Vitamin deficiency, unspecified: Secondary | ICD-10-CM | POA: Diagnosis not present

## 2014-06-25 DIAGNOSIS — I10 Essential (primary) hypertension: Secondary | ICD-10-CM | POA: Diagnosis not present

## 2014-06-25 DIAGNOSIS — I8311 Varicose veins of right lower extremity with inflammation: Secondary | ICD-10-CM | POA: Diagnosis not present

## 2014-06-25 DIAGNOSIS — I8312 Varicose veins of left lower extremity with inflammation: Secondary | ICD-10-CM | POA: Diagnosis not present

## 2014-06-25 DIAGNOSIS — G8929 Other chronic pain: Secondary | ICD-10-CM | POA: Diagnosis not present

## 2014-06-25 DIAGNOSIS — I719 Aortic aneurysm of unspecified site, without rupture: Secondary | ICD-10-CM | POA: Diagnosis not present

## 2014-06-25 DIAGNOSIS — Z9119 Patient's noncompliance with other medical treatment and regimen: Secondary | ICD-10-CM

## 2014-06-25 DIAGNOSIS — G4700 Insomnia, unspecified: Secondary | ICD-10-CM | POA: Diagnosis not present

## 2014-06-25 DIAGNOSIS — J189 Pneumonia, unspecified organism: Secondary | ICD-10-CM | POA: Diagnosis not present

## 2014-06-25 DIAGNOSIS — C9440 Acute panmyelosis with myelofibrosis not having achieved remission: Secondary | ICD-10-CM | POA: Diagnosis not present

## 2014-06-25 DIAGNOSIS — R531 Weakness: Secondary | ICD-10-CM | POA: Diagnosis not present

## 2014-06-25 DIAGNOSIS — E119 Type 2 diabetes mellitus without complications: Secondary | ICD-10-CM | POA: Diagnosis not present

## 2014-06-25 DIAGNOSIS — J159 Unspecified bacterial pneumonia: Secondary | ICD-10-CM | POA: Diagnosis not present

## 2014-06-25 DIAGNOSIS — R609 Edema, unspecified: Secondary | ICD-10-CM | POA: Diagnosis not present

## 2014-06-25 DIAGNOSIS — D471 Chronic myeloproliferative disease: Secondary | ICD-10-CM | POA: Diagnosis not present

## 2014-06-25 DIAGNOSIS — J309 Allergic rhinitis, unspecified: Secondary | ICD-10-CM | POA: Diagnosis not present

## 2014-06-25 DIAGNOSIS — D47Z9 Other specified neoplasms of uncertain behavior of lymphoid, hematopoietic and related tissue: Secondary | ICD-10-CM | POA: Diagnosis not present

## 2014-06-25 DIAGNOSIS — I509 Heart failure, unspecified: Secondary | ICD-10-CM | POA: Diagnosis not present

## 2014-06-25 DIAGNOSIS — N183 Chronic kidney disease, stage 3 (moderate): Secondary | ICD-10-CM | POA: Diagnosis not present

## 2014-06-25 DIAGNOSIS — R627 Adult failure to thrive: Secondary | ICD-10-CM | POA: Diagnosis not present

## 2014-06-25 DIAGNOSIS — I5032 Chronic diastolic (congestive) heart failure: Secondary | ICD-10-CM | POA: Diagnosis not present

## 2014-06-25 DIAGNOSIS — F411 Generalized anxiety disorder: Secondary | ICD-10-CM | POA: Diagnosis not present

## 2014-06-25 DIAGNOSIS — G4730 Sleep apnea, unspecified: Secondary | ICD-10-CM | POA: Diagnosis not present

## 2014-06-25 DIAGNOSIS — K5900 Constipation, unspecified: Secondary | ICD-10-CM | POA: Diagnosis not present

## 2014-06-25 DIAGNOSIS — I2510 Atherosclerotic heart disease of native coronary artery without angina pectoris: Secondary | ICD-10-CM | POA: Diagnosis not present

## 2014-06-25 DIAGNOSIS — K219 Gastro-esophageal reflux disease without esophagitis: Secondary | ICD-10-CM | POA: Diagnosis not present

## 2014-06-25 DIAGNOSIS — I714 Abdominal aortic aneurysm, without rupture: Secondary | ICD-10-CM | POA: Diagnosis not present

## 2014-06-25 LAB — CBC
HEMATOCRIT: 27.5 % — AB (ref 39.0–52.0)
Hemoglobin: 8.6 g/dL — ABNORMAL LOW (ref 13.0–17.0)
MCH: 25.7 pg — ABNORMAL LOW (ref 26.0–34.0)
MCHC: 31.3 g/dL (ref 30.0–36.0)
MCV: 82.3 fL (ref 78.0–100.0)
Platelets: 337 10*3/uL (ref 150–400)
RBC: 3.34 MIL/uL — AB (ref 4.22–5.81)
RDW: 20 % — ABNORMAL HIGH (ref 11.5–15.5)
WBC: 22.4 10*3/uL — ABNORMAL HIGH (ref 4.0–10.5)

## 2014-06-25 LAB — PROTIME-INR
INR: 1.27 (ref 0.00–1.49)
Prothrombin Time: 16 seconds — ABNORMAL HIGH (ref 11.6–15.2)

## 2014-06-25 LAB — GLUCOSE, CAPILLARY: GLUCOSE-CAPILLARY: 81 mg/dL (ref 70–99)

## 2014-06-25 MED ORDER — DOXYCYCLINE HYCLATE 100 MG PO TABS: 100.0000 mg | ORAL_TABLET | Freq: Two times a day (BID) | ORAL | Status: DC

## 2014-06-25 NOTE — Clinical Social Work Placement (Signed)
Clinical Social Work Department CLINICAL SOCIAL WORK PLACEMENT NOTE 06/25/2014  Patient:  Juan [name], Juan [name]  Account Number:  [id number] Admit date:  06/16/2014  Clinical Social Worker:  Wylene Men  Date/time:  06/25/2014 11:05 AM  Clinical Social Work is seeking post-discharge placement for this patient at the following level of care:   SKILLED NURSING   (*CSW will update this form in Epic as items are completed)   06/22/2014  Patient/family provided with [place] Department of Clinical Social Work's list of facilities offering this level of care within the geographic area requested by the patient (or if unable, by the patient's family).  06/22/2014  Patient/family informed of their freedom to choose among providers that offer the needed level of care, that participate in Medicare, Medicaid or managed care program needed by the patient, have an available bed and are willing to accept the patient.  06/22/2014  Patient/family informed of [facility]' ownership interest in [facility], as well as of the fact that they are under no obligation to receive care at this facility.  PASARR submitted to EDS on  PASARR number received on   FL2 transmitted to all facilities in geographic area requested by pt/family on  06/22/2014 FL2 transmitted to all facilities within larger geographic area on   Patient informed that his/her managed care company has contracts with or will negotiate with  certain facilities, including the following:     Patient/family informed of bed offers received:  06/22/2014 Patient chooses bed at [place] Physician recommends and patient chooses bed at    Patient to be transferred to [facility] on  06/25/2014 Patient to be transferred to facility by [facility] Patient and family notified of transfer on 06/25/2014 Name of family member notified:  [name], son  The following physician request were entered in  Epic:   Additional Comments:  Nonnie Done, [place] [phone]  Psychiatric & Orthopedics (5N 1-16) Clinical Social Worker

## 2014-06-25 NOTE — Consult Note (Signed)
WOC wound consult note Reason for Consult: evaluate pressure ulcers.  Pt well known to this [place] from his previous admissions. The areas on the bilateral buttock are now fully reepithelialized.  Not open at this time, scar tissue Wound type: no open wound Silicone foam in place to protect the newly healed pressure ulcers.  No other care recommend  at this time. May benefit from chair pressure redistribution pad for when he is sitting up since these areas are high risk for breakdown since he had previous ulcerations.  Will order for patient to use at DC  Discussed POC with patient and bedside nurse.  Re consult if needed, will not follow at this time. Thanks  Dellis Voght [place], [place] [phone])

## 2014-06-25 NOTE — Discharge Planning (Signed)
Patient will discharge today per MD order Patient will discharge to [facility] RN to call report prior to transportation to [phone] Transportation: [facility] (scheduled for 2pm per SNF request)  CSW sent discharge summary to SNF for review.  Packet is complete.  RN, patient and son aware of discharge plans.  Nonnie Done, [place] [phone]  Psychiatric & Orthopedics (5N 1-16) Clinical Social Worker

## 2014-06-25 NOTE — Progress Notes (Addendum)
Occupational Therapy Treatment Patient Details Name: Juan [name] MRN: [id number] DOB: 09-29-1940 Today's Date: 06/25/2014    History of present illness 74 y.o. male with a history of CAD, Diastolic CHF, AML,  HTN,  DM2 who presents to the ED with complaints of worsening BLE edema and difficulty walking as well as weakness and malaise and SOB.  Pt dx with HCAP and cellulitis of right leg. Pt with new diagnosis of CLL (Chronic Lymphocytic Leukemia)   OT comments  Pt.  Presents with increased agitation and verbal perseveration of his needs and wants this a.m. Making progress quite difficult.  Pt. Yelling out and verbally inappropriate to multiple staff members during session.  Also note paronoia, (pt. Currently has pt. Belongings bag tied around his neck under his gown and will not remove. )  Able to complete bed mobility with S.  Requires max A for LB ADLS.  Mod a for sit/stand.  Agree with likely d/c today to snf for continued therapies.    Follow Up Recommendations  SNF;Supervision/Assistance - 24 hour    Equipment Recommendations  None recommended by OT    Recommendations for Other Services      Precautions / Restrictions Precautions Precautions: Fall Restrictions Weight Bearing Restrictions: No       Mobility Bed Mobility Overal bed mobility: Needs Assistance Bed Mobility: Supine to Sit;Sidelying to Sit;Rolling Rolling: Supervision Sidelying to sit: Supervision Supine to sit: Supervision     General bed mobility comments: Supervision for safety due to slow speed of transitions and heavy reliance on upper extremity support to pull up to sit.   Transfers Overall transfer level: Needs assistance Equipment used: Rolling walker (2 wheeled) Transfers: Sit to/from [facility] Sit to Stand: Mod assist Stand pivot transfers: Min assist       General transfer comment: mod a sit/stand from elevated bed, min a for stand pivot transfer    Balance     Sitting  balance-Leahy Scale: Good       Standing balance-Leahy Scale: Fair                     ADL Overall ADL's : Needs assistance/impaired                     Lower Body Dressing: Maximal assistance;Sitting/lateral leans Lower Body Dressing Details (indicate cue type and reason): max a to don B socks while seated eob Toilet Transfer: Minimal assistance;Stand-pivot Toilet Transfer Details (indicate cue type and reason): simulated eob to recliner, required height of bed to be raised in order to transition into standing with decreased assist required           General ADL Comments: pt. requires max a for LB ADLS and will benefit from A/E at next venue of care.  agitation and other appeared cognitive issues greatly affect his progression with skilled therapies.  continuous interuption and repetition of questions      Vision                     Perception     Praxis      Cognition   Behavior During Therapy: Agitated;Anxious;Impulsive Overall Cognitive Status: No family/caregiver present to determine baseline cognitive functioning                       Extremity/Trunk Assessment               Exercises     Shoulder  Instructions       General Comments  perseveration and agitation with all verbal needs and wants.  escalates and becomes verbally inappropriate as anxiety and agitation increase.  Difficult to re-direct       Pertinent Vitals/ Pain       Pain Assessment: No/denies pain  Home Living                                          Prior Functioning/Environment              Frequency Min 2X/week     Progress Toward Goals  OT Goals(current goals can now be found in the care plan section)  Progress towards OT goals: Progressing toward goals     Plan Discharge plan remains appropriate    Co-evaluation                 End of Session Equipment Utilized During Treatment: Gait belt;Rolling walker    Activity Tolerance Treatment limited secondary to agitation   Patient Left in chair;with chair alarm set;with call [place]/phone within reach   Nurse Communication          Time: [phone] OT Time Calculation (min): 30 min  Charges: OT General Charges $OT Visit: 1 Procedure OT Treatments $Self Care/Home Management : 23-37 mins  Janice Coffin, COTA/L 06/25/2014, 9:12 AM

## 2014-06-25 NOTE — Progress Notes (Signed)
Physical Therapy Treatment Patient Details Name: Darrian Grzelak MRN: [id number] DOB: [birth year] Today's Date: 06/25/2014    History of Present Illness 74 y.o. male with a history of CAD, Diastolic CHF, AML,  HTN,  DM2 who presents to the ED with complaints of worsening BLE edema and difficulty walking as well as weakness and malaise and SOB.  Pt dx with HCAP and cellulitis of right leg. Pt with new diagnosis of CLL (Chronic Lymphocytic Leukemia)    PT Comments    Pt is progressing well with increased gait distance today.  He reports feeling better than last week, but continues to need min to mod assist for all mobility. He is due to d/c to SNF for rehab today.  PT will continue to follow acutely until d/c.  Frequency updated to reflect department guidelines based on confirmed SNF d/c plan.    Follow Up Recommendations  SNF     Equipment Recommendations  Rolling walker with 5" wheels    Recommendations for Other Services   NA     Precautions / Restrictions Precautions Precautions: Fall Precaution Comments: due to generalized weakness    Mobility  Bed Mobility Overal bed mobility: Needs Assistance Bed Mobility: Supine to Sit   Sidelying to sit: Supervision       General bed mobility comments: Supervision for safety due to heavy reliance on hands on railing for leverage to get to sitting EOB.   Transfers Overall transfer level: Needs assistance Equipment used: Rolling walker (2 wheeled) Transfers: Sit to/from Stand Sit to Stand: Mod assist         General transfer comment: Mod assist to support trunk during transition to stand over weak leg.  Bed elevated to help due to weakness and tall stature.   Ambulation/Gait Ambulation/Gait assistance: Min assist;+2 safety/equipment (second person for chair to follow. ) Ambulation Distance (Feet): 110 Feet Assistive device: Rolling walker (2 wheeled) Gait Pattern/deviations: Step-through pattern;Shuffle;Trunk flexed Gait velocity:  decreased Gait velocity interpretation: Below normal speed for age/gender General Gait Details: Pt with flexed posture, verbal cues to stay closer to RW and stand tall.  Min assist for balance and to help steer RW around tighter obstacles in room.  Pt had chair to follow for safety and to encourage increased gait distance.        Balance Overall balance assessment: Needs assistance Sitting-balance support: Feet supported;No upper extremity supported Sitting balance-Leahy Scale: Good     Standing balance support: Bilateral upper extremity supported;No upper extremity supported;Single extremity supported Standing balance-Leahy Scale: Fair                      Cognition Arousal/Alertness: Awake/alert Behavior During Therapy: Restless Overall Cognitive Status: No family/caregiver present to determine baseline cognitive functioning       Memory: Decreased short-term memory                     Pertinent Vitals/Pain Pain Assessment: No/denies pain           PT Goals (current goals can now be found in the care plan section) Acute Rehab PT Goals Patient Stated Goal: to get stronger, walk more over the weekend Progress towards PT goals: Progressing toward goals    Frequency  Min 2X/week    PT Plan Frequency needs to be updated       End of Session   Activity Tolerance: Patient limited by fatigue Patient left: in chair;with call [place]/phone within reach;with chair alarm set  Time: 1145-1200 PT Time Calculation (min) (ACUTE ONLY): 15 min  Charges:  $Gait Training: 8-22 mins                      Korrine Sicard B. [place], [place], DPT [phone]   06/25/2014, 5:25 PM

## 2014-06-25 NOTE — Progress Notes (Signed)
CARE MANAGEMENT NOTE 06/25/2014  Patient:  Juan [name], Juan [name]   Account Number:  [id number]  Date Initiated:  06/20/2014  Documentation initiated by:  [facility]  Subjective/Objective Assessment:   right leg cellulitis  active with [facility] for [facility] and HHPT     Action/Plan:   PT/OT evals- recommended SNF   Anticipated DC Date:  06/25/2014   Anticipated DC Plan:  SKILLED NURSING FACILITY  In-house referral  Clinical Social Worker      DC Planning Services  CM consult               Status of service:  Completed, signed off Medicare Important Message given?  YES Date Medicare IM given:  06/20/2014 Medicare IM given by:  [facility] Date Additional Medicare IM given:  06/25/2014 Additional Medicare IM given by:  [place] Plan  Discharge Disposition:  [place]  Per UR Regulation:  Reviewed for med. necessity/level of care/duration of stay   Comments:  06/25/14 Patient decided to go to SNF. Discharged to [facility] SNF. Contacted Luellen Pucker with [facility] and updated her to patient's discharge.  06/20/14 Spoke with patient about d /c plans. Patient states that he lives with his son and is agreeable to resuming [place] with [facility]. Patient is not agreeable to going to a SNF. [place] [phone], spoke with Luellen Pucker, informed her that plan is for patient to discharge 06/21/14. [facility] was seeing patient for [facility] and HHPT. I will continue to follow for d/c needs.

## 2014-06-25 NOTE — Progress Notes (Signed)
Patient ID: Juan [name], male   DOB: 05/30/1940, 74 y.o.   MRN: [id number]    Pt refusing Bone Marrow bx this am per RN. Per RN

## 2014-06-25 NOTE — Discharge Summary (Signed)
Physician Discharge Summary  Haskell Rihn DGU:[id number] DOB: 1941/01/07 DOA: 06/16/2014  PCP: [name], MD  Admit date: 06/16/2014 Discharge date: 06/25/2014  Time spent: 55 minutes  Recommendations for Outpatient Follow-up:  1. Daily weights 2. Bmet in 1 wk to check renal function and K+ level 3. Skin care, patient to ambulate bid, bed to chair tid, continue physical therapy 4. F/u with oncology to further discuss biopsy, patient refused biopsy during this hospitalization after multiple providers explained to him the importance of getting biopsy for diagnosis and further treatment option. 5. F/u with pmd, consider psych eval, patient does not actively participate medical care.  Discharge Condition: stable Diet recommendation: heart healthy, low sodium, diabetic diet  Discharge Diagnoses:  Principal Problem:   HCAP (healthcare-associated pneumonia) Active Problems:   Myeloproliferative disease   Essential hypertension   Chronic diastolic CHF (congestive heart failure)   Chronic lower extremity edema, stasis dermatitis   Type II diabetes mellitus   CKD (chronic kidney disease) stage 3, GFR 30-59 ml/min   Weakness generalized   Pain in limb   Peripheral edema   Unable to ambulate   Cellulitis of leg, right?   Peripheral neuropathy   History of present illness:  Juan [name] is a 74 y.o. male with a history of CAD, Diastolic CHF, AML, HTN, DM2, ?peripheral neuropathy who presented to the ED with complaints of worsening BLE edema/pain in feet and difficulty walking as well as weakness,malaise and SOB. It had been less than a week after discharge from [facility] where he resided for approximately 3 months per his report.  He was found to have HCAP with multifocal patchy opacities on CXR. Bilateral venous Doppler of the lower extremities was negative for DVT. It would appear that the lower extremity pains were related to cellulitis perhaps superimposed on peripheral neuropathy related to  diabetes. His dose of Neurontin was therefore increased for better pain control.   Hospital Course:   Bone metastasis? Myeloproliferative disease? Cbc with diff does have few blast cell reported. Patient is very poor historian? in denial? Son does not know the detail either, reported patient is not very forthcoming with medical problem in the past. oncology consulted, ct guided bone biopsy ordered by oncology. Patient refused to have biopsy done. Outpatient follow up with oncology.  Bilateral patchy infiltrate in lung, edema? Atypical infection? Sputum culture no growth, switched to doxycycline orally on 2/4 to minimize volume.   Chronic diastolic chf. Continue lasix. Monitor volume status. No obvious lower extremity today on exam N/v, resolved.   kub no obstruction, Ct chest showed hard stool in colon, bowel regimen. Prn reglan, antiemetics.   RN reported possible bowel incontinence, with weakness and ? Cancer, mri l spine no cord compression.  Generalized weakness, patient does not participate care actively. Passive range of motion, ambulation, bed to chair tid ordered. Continue pt/ot/skin care.  FTT, bilateral heal pressure ulcer, dressing, offloading measures. PT/OT/SNF, talked to son, son agree with snf. Patient currently state he think snf if a reasonable choice. Patient refused biopsy which was scheduled on 2/8. He is to be discharged to SNF, patient want more time to think about it. Outpatient follow up with oncology.  bilateral heal pressure ulcer, patient has not been able to walk and does not seem to be motivated to get up either in the last several months. He reported his heal has been raw prior to this admission. Will need off loading measures/skin care/pt/ot.  Consider psychiatric eval, patient does not actively participate in  care or daily activity even with maximum attempt from medical staff to encourage him to do so.  Patient family aware that patient not forthcoming with  medical information, the concerning for cancer diagnosis. Family agree with snf placement.  Code Status: full    Procedures:  B/l venous duplex- negative on 1/30   Discharge Exam: Filed Weights   06/17/14 0100 06/17/14 0145  Weight: 103.9 kg (229 lb 0.9 oz) 102.6 kg (226 lb 3.1 oz)   Filed Vitals:   06/25/14 0459  BP: 102/57  Pulse: 77  Temp: 98.3 F (36.8 C)  Resp:     General: AAO x 3, no distress,  Cardiovascular: RRR, no murmurs  Respiratory: clear to auscultation bilaterally GI: soft, non-tender, non-distended, bowel sound positive MSK: no edema or ulcers on legs  Discharge Instructions You were cared for by a hospitalist during your hospital stay. If you have any questions about your discharge medications or the care you received while you were in the hospital after you are discharged, you can call the unit and asked to speak with the hospitalist on call if the hospitalist that took care of you is not available. Once you are discharged, your primary care physician will handle any further medical issues. Please note that NO REFILLS for any discharge medications will be authorized once you are discharged, as it is imperative that you return to your primary care physician (or establish a relationship with a primary care physician if you do not have one) for your aftercare needs so that they can reassess your need for medications and monitor your lab values.      Discharge Instructions    Diet - low sodium heart healthy    Complete by:  As directed      Increase activity slowly    Complete by:  As directed             Medication List    STOP taking these medications        pantoprazole 40 MG tablet  Commonly known as:  PROTONIX      TAKE these medications        acetaminophen 500 MG tablet  Commonly known as:  TYLENOL  Take 500-1,000 mg by mouth every 6 (six) hours as needed for moderate pain or headache.     aspirin 325 MG tablet  Take 1 tablet (325 mg  total) by mouth daily.     b complex vitamins capsule  Take 1 capsule by mouth daily.     doxycycline 100 MG tablet  Commonly known as:  VIBRA-TABS  Take 1 tablet (100 mg total) by mouth every 12 (twelve) hours.     feeding supplement (GLUCERNA SHAKE) Liqd  Take 237 mLs by mouth 2 (two) times daily between meals.     Ferrous Sulfate Dried 200 (65 FE) MG Tabs  Take 1 tablet by mouth daily.     fluticasone 50 MCG/ACT nasal spray  Commonly known as:  FLONASE  Place 2 sprays into both nostrils daily.     folic acid 1 MG tablet  Commonly known as:  FOLVITE  Take 1 mg by mouth daily.     furosemide 40 MG tablet  Commonly known as:  LASIX  Take 1 tablet (40 mg total) by mouth 2 (two) times daily.     gabapentin 100 MG capsule  Commonly known as:  NEURONTIN  Take 1 capsule (100 mg total) by mouth 3 (three) times daily.     hydrocerin  Crea  Apply Eucerin cream to BLE Q day after bathing and roughly towel drying to remove loose skin     HYDROcodone-acetaminophen 5-325 MG per tablet  Commonly known as:  NORCO/VICODIN  Take 1 tablet by mouth every 6 (six) hours as needed for moderate pain.     metoprolol tartrate 25 MG tablet  Commonly known as:  LOPRESSOR  Take 1 tablet (25 mg total) by mouth 2 (two) times daily.     potassium chloride SA 20 MEQ tablet  Commonly known as:  K-DUR,KLOR-CON  Take 20 mEq by mouth daily.     senna-docusate 8.6-50 MG per tablet  Commonly known as:  Senokot-S  Take 1 tablet by mouth at bedtime.     sodium chloride 0.65 % Soln nasal spray  Commonly known as:  OCEAN  Place 1-2 sprays into both nostrils every 4 (four) hours as needed for congestion.     zolpidem 5 MG tablet  Commonly known as:  AMBIEN  Take 1 tablet (5 mg total) by mouth at bedtime.       No Known Allergies Follow-up Information    Follow up with [name], MD.   Specialty:  Internal Medicine   Contact information:   [address] [place]  [address] [phone]       Follow up with Volanda Napoleon, MD In 2 weeks.   Specialty:  Oncology   Contact information:   [place] [place] [address] [phone]        The results of significant diagnostics from this hospitalization (including imaging, microbiology, ancillary and laboratory) are listed below for reference.    Significant Diagnostic Studies: Dg Chest 2 View  06/16/2014   CLINICAL DATA:  Lower extremity pain and swelling.  EXAM: CHEST  2 VIEW  COMPARISON:  Prior radiographs dated 04/15/2014, 03/31/2014, 11 04/2014, 01/26/2014 and 12/09/2013  FINDINGS: There is unchanged right hemidiaphragm elevation. There is unchanged cardiomegaly and aortic tortuosity. Scattered airspace opacities are again evident in the central and basilar regions of both lungs, without significant interval change. Diffuse bony sclerosis is again evident and may relate to the described history of a myeloproliferative disease.  IMPRESSION: Multifocal patchy airspace opacities in the central and basilar regions bilaterally, present on numerous prior studies although it does appear to have been slowly worsening compared to older examinations. Chronic infectious, inflammatory or neoplastic processes may be considered. No convincing acute findings are evident.   Electronically Signed   By: Andreas Newport M.D.   On: 06/16/2014 22:51   Ct Chest Wo Contrast  06/21/2014   CLINICAL DATA:  Patient denies chest pain or shortness of breath. Cough.  EXAM: CT CHEST WITHOUT CONTRAST  TECHNIQUE: Multidetector CT imaging of the chest was performed following the standard protocol without IV contrast.  COMPARISON:  04/15/2014  FINDINGS: Mediastinum: The heart size appears normal. There is no pericardial effusion identified. The trachea appears patent and is midline. Normal appearance of the esophagus. No pathologically enlarged mediastinal or hilar lymph nodes identified.  Lungs/Pleura: There is no  pleural effusion identified. Mosaic attenuation pattern is identified in both lungs. Subsegmental scar versus platelike atelectasis noted with right middle lobe. No suspicious pulmonary nodule or mass.  Upper Abdomen: There is no suspicious liver abnormality. Stones and/or sludge noted within the dependent portion of the gallbladder. The spleen appears enlarged.  Musculoskeletal: Widespread sclerotic bone metastases are again identified.  IMPRESSION: 1. There is a mosaic attenuation pattern within  both lungs. In the absence of any clinical suspicion for pulmonary embolus this is likely due to small airways disease. 2. Widespread sclerotic bone metastasis. 3. Splenomegaly.   Electronically Signed   By: Kerby Moors M.D.   On: 06/21/2014 13:30   Mr Lumbar Spine Wo Contrast  06/23/2014   CLINICAL DATA:  Initial evaluation for low back pain, incontinence.  EXAM: MRI LUMBAR SPINE WITHOUT CONTRAST  TECHNIQUE: Multiplanar, multisequence MR imaging of the lumbar spine was performed. No intravenous contrast was administered.  COMPARISON:  None.  FINDINGS: For the purposes of this dictation, the lowest well-formed intervertebral disc spaces presumed to be the L5-S1 level, and there presumed to be 5 lumbar type vertebral bodies.  Vertebral bodies are normally aligned with preservation of the normal lumbar lordosis. Vertebral body heights are preserved. No acute fracture listhesis. Mild dextroscoliosis noted centered at L2-3.  Signal intensity within the visualized bone marrow is somewhat patchy and heterogeneous without focal osseous lesion. This may be related to underlying osteoporosis.  Conus medullaris terminates normally at the L2 level. Signal intensity within the visualized cord is normal. Nerve roots of the cauda equina are unremarkable.  Paraspinous soft tissues within normal limits. Fatty atrophy noted within the paraspinous musculature. T2 hyperintense exophytic cyst noted at the inferior pole the right kidney.  No retroperitoneal adenopathy.  At T11-12, T12-L1, and L1-2, there is no disc bulge or disc protrusion. Intervertebral discs well hydrated. No significant facet arthropathy. No canal or foraminal stenosis.  L2-3: Mild diffuse disc bulge with disc desiccation. No focal disc herniation. Mild bilateral facet arthrosis. No significant canal or foraminal stenosis.  L3-4: Minimal disc bulge with disc desiccation and mild intervertebral disc space narrowing. Left greater than right facet arthrosis. No significant canal or foraminal stenosis.  L4-5: Disc bulge with disc desiccation and intervertebral disc space narrowing. Degenerative endplate changes present. There is a superimposed right foraminal/far lateral shallow disc protrusion (series 700, image 33). No associated nerve root impingement. Moderate bilateral facet arthrosis present, worse on the right. There is mild canal and bilateral lateral recess stenosis, largely due to facet disease. Mild bilateral foraminal narrowing present, slightly worse on the right.  L5-S1: No disc bulge or disc protrusion. Right greater than left facet arthrosis. No canal or foraminal stenosis.  IMPRESSION: 1. No canal significant canal stenosis identified within the lumbar spine. 2. Mild disc bulge with superimposed right foraminal disc protrusion at L4-5 without neural impingement. These changes superimposed on facet arthropathy at this level result in mild canal and bilateral foraminal narrowing, slightly worse on the right. 3. Mild degenerative disc bulge with facet arthropathy at L2-3 and L3-4 without significant stenosis.   Electronically Signed   By: Jeannine Boga M.D.   On: 06/23/2014 06:58   Dg Abd Portable 1v  06/21/2014   CLINICAL DATA:  Vomiting  EXAM: PORTABLE ABDOMEN - 1 VIEW  COMPARISON:  None.  FINDINGS: Diffuse bone sclerosis. As noted previously this could represent widespread metastatic disease or a myeloproliferative process in this patient with splenomegaly.  No findings to suggest renal osteodystrophy. The bowel gas pattern is nonobstructed. No concerning intra-abdominal mass effect calcification.  IMPRESSION: 1. No evidence of bowel obstruction. 2. Diffuse skeletal sclerosis as seen with widespread metastatic disease or myeloproliferative disorder.   Electronically Signed   By: Jorje Guild M.D.   On: 06/21/2014 18:10    Microbiology: Recent Results (from the past 240 hour(s))  Culture, expectorated sputum-assessment     Status: None  Collection Time: 06/22/14 11:40 AM  Result Value Ref Range Status   Specimen Description SPU  Final   Special Requests Immunocompromised  Final   Sputum evaluation   Final    THIS SPECIMEN IS ACCEPTABLE. RESPIRATORY CULTURE REPORT TO FOLLOW.   Report Status 06/22/2014 FINAL  Final  Culture, respiratory (NON-Expectorated)     Status: None   Collection Time: 06/22/14 11:40 AM  Result Value Ref Range Status   Specimen Description SPUTUM  Final   Special Requests NONE  Final   Gram Stain   Final    RARE WBC PRESENT, PREDOMINANTLY PMN RARE SQUAMOUS EPITHELIAL CELLS PRESENT NO ORGANISMS SEEN Performed at [facility]    Culture   Final    NORMAL OROPHARYNGEAL FLORA Performed at [facility]    Report Status 06/24/2014 FINAL  Final     Labs: Basic Metabolic Panel:  Recent Labs Lab 06/20/14 0538 06/22/14 0529  NA 138 139  K 4.6 4.7  CL 105 109  CO2 27 24  GLUCOSE 98 115*  BUN 24* 23  CREATININE 1.20 1.05  CALCIUM 9.3 9.9   Liver Function Tests:  Recent Labs Lab 06/20/14 0538 06/22/14 0529  AST 27 31  ALT 9 11  ALKPHOS 87 84  BILITOT 0.5 0.6  PROT 6.2 6.2  ALBUMIN 2.9* 3.0*    Recent Labs Lab 06/22/14 0529  LIPASE 21   No results for input(s): AMMONIA in the last 168 hours. CBC:  Recent Labs Lab 06/20/14 0538 06/22/14 0529 06/25/14 0620  WBC 19.0* 22.3* 22.4*  NEUTROABS  --  12.7*  --   HGB 8.7* 8.9* 8.6*  HCT 27.6* 28.6* 27.5*  MCV 81.9 83.6 82.3   PLT 297 331 337   Cardiac Enzymes: No results for input(s): CKTOTAL, CKMB, CKMBINDEX, TROPONINI in the last 168 hours. BNP: BNP (last 3 results)  Recent Labs  06/16/14 1940 06/20/14 0538  BNP 18.8 227.8*    ProBNP (last 3 results)  Recent Labs  01/25/14 1115 03/29/14 1549 04/14/14 2335  PROBNP 828.2* 6628.0* 4369.0*    CBG:  Recent Labs Lab 06/24/14 0645 06/24/14 1240 06/24/14 1723 06/24/14 2153 06/25/14 0643  GLUCAP 90 123* 91 99 81       Signed:  Mylin Gignac, MD [facility] 06/25/2014, 8:43 AM

## 2014-06-25 NOTE — Progress Notes (Signed)
Left message at [facility] for RN to return my call so that I can give report on patient who is being discharged there today.

## 2014-06-26 ENCOUNTER — Telehealth: Payer: Self-pay | Admitting: Hematology & Oncology

## 2014-06-26 ENCOUNTER — Telehealth: Payer: Self-pay

## 2014-06-26 LAB — GLUCOSE, CAPILLARY: GLUCOSE-CAPILLARY: 93 mg/dL (ref 70–99)

## 2014-06-26 NOTE — Telephone Encounter (Signed)
Admit date: 06/16/2014 Discharge date: 06/25/2014   Reason for admission:  Peripheral Edema and HCAP  No answer. Unable to leave voice message.  Mailbox not set up.  Will try again later.

## 2014-06-26 NOTE — Telephone Encounter (Signed)
UNABLE TO LEAVE MESSAGE FOR  PT   ON 202  NUMBER  WILL TRY  NUMBER  LISTED   AS [facility]  NUMBER ./CY LM TO  CALL BACK   ON [facility]  NUMBER .Adonis Housekeeper

## 2014-06-26 NOTE — Telephone Encounter (Signed)
Nursing home called schedule 2-15 appointment per MD it's hospital follow up

## 2014-06-28 ENCOUNTER — Telehealth: Payer: Self-pay | Admitting: Internal Medicine

## 2014-06-28 ENCOUNTER — Ambulatory Visit: Payer: Medicare Other | Admitting: Internal Medicine

## 2014-06-28 NOTE — Telephone Encounter (Signed)
Caller name:  Levada Dy from [facility] Relation to pt: Call back number: [phone] Pharmacy:  Reason for call:   Wants to know patient last hemoglobin A1c.

## 2014-06-28 NOTE — Telephone Encounter (Signed)
Spoke with Levada Dy at [facility], informed her last A1c was 06/19/2014 at the hospital and was 5.7. Levada Dy verbalized understanding.

## 2014-06-29 ENCOUNTER — Other Ambulatory Visit: Payer: Self-pay | Admitting: *Deleted

## 2014-06-29 DIAGNOSIS — D471 Chronic myeloproliferative disease: Secondary | ICD-10-CM

## 2014-06-29 DIAGNOSIS — I5032 Chronic diastolic (congestive) heart failure: Secondary | ICD-10-CM

## 2014-06-29 NOTE — Telephone Encounter (Signed)
SPOKE WITH PT   IS  CURRENTLY LIVING  AT  [place]  DX  WITH INFECTION  IN BOTH   LEGS  PT  WILL CALL   TO MAKE  AN APPT   AT   LATER  DATE./CY

## 2014-07-02 ENCOUNTER — Encounter: Payer: Self-pay | Admitting: Hematology & Oncology

## 2014-07-02 ENCOUNTER — Other Ambulatory Visit (HOSPITAL_BASED_OUTPATIENT_CLINIC_OR_DEPARTMENT_OTHER): Payer: Medicare Other | Admitting: Lab

## 2014-07-02 ENCOUNTER — Ambulatory Visit (HOSPITAL_BASED_OUTPATIENT_CLINIC_OR_DEPARTMENT_OTHER): Payer: Medicare Other | Admitting: Hematology & Oncology

## 2014-07-02 VITALS — BP 121/56 | HR 73 | Temp 97.4°F | Resp 18 | Ht 73.0 in | Wt 219.0 lb

## 2014-07-02 DIAGNOSIS — D471 Chronic myeloproliferative disease: Secondary | ICD-10-CM

## 2014-07-02 LAB — CBC WITH DIFFERENTIAL (CANCER CENTER ONLY)
HCT: 30.4 % — ABNORMAL LOW (ref 38.7–49.9)
HEMOGLOBIN: 9.3 g/dL — AB (ref 13.0–17.1)
MCH: 26.3 pg — AB (ref 28.0–33.4)
MCHC: 30.6 g/dL — ABNORMAL LOW (ref 32.0–35.9)
MCV: 86 fL (ref 82–98)
Platelets: 322 10*3/uL (ref 145–400)
RBC: 3.53 10*6/uL — ABNORMAL LOW (ref 4.20–5.70)
RDW: 20.1 % — ABNORMAL HIGH (ref 11.1–15.7)
WBC: 27.1 10*3/uL — ABNORMAL HIGH (ref 4.0–10.0)

## 2014-07-02 LAB — MANUAL DIFFERENTIAL (CHCC SATELLITE)
ALC: 4.9 10*3/uL — AB (ref 0.9–3.3)
ANC (CHCC MAN DIFF): 16.8 10*3/uL — AB (ref 1.5–6.5)
BASO: 6 % — ABNORMAL HIGH (ref 0–2)
Band Neutrophils: 10 % (ref 0–10)
Blasts: 4 % — ABNORMAL HIGH (ref 0–0)
EOS: 4 % (ref 0–7)
LYMPH: 18 % (ref 14–48)
MONO: 5 % (ref 0–13)
Metamyelocytes: 7 % — ABNORMAL HIGH (ref 0–0)
Myelocytes: 11 % — ABNORMAL HIGH (ref 0–0)
PLT EST ~~LOC~~: ADEQUATE
PROMYELO: 1 % — ABNORMAL HIGH (ref 0–0)
SEG: 34 % — AB (ref 40–75)
nRBC: 7 % — ABNORMAL HIGH (ref 0–0)

## 2014-07-02 NOTE — Progress Notes (Signed)
Hematology and Oncology Follow Up Visit  Juan [name] [id number] November 04, 1940 74 y.o. 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 We saw him in the hospital a couple weeks ago. He was admitted because of infection area and he had a lot of weight loss. He is not eating. He had a possible urinary tract infection.  He has a known history of a myeloproliferative disorder. He has seen several doctors. He has refused to have any workup for this. It has been recommended that he have a bone marrow biopsy. He is refused.  He is in a nursing home now. We try to get a bone marrow biopsy when he was in the hospital but he refused.  He did have a MRI of the lumbar spine. This was pretty much unremarkable. He had L4-5 disc bulge. There is some facet arthropathy. No spinal cord impingement.  He comes in a wheelchair. I much or how much he is eating. Overall, his performance status is ECOG 3. Medications:  Current outpatient prescriptions:  .  acetaminophen (TYLENOL) 500 MG tablet, Take 500-1,000 mg by mouth every 6 (six) hours as needed for moderate pain or headache. , Disp: , Rfl:  .  aspirin 325 MG tablet, Take 1 tablet (325 mg total) by mouth daily., Disp: , Rfl:  .  b complex vitamins capsule, Take 1 capsule by mouth daily., Disp: 30 capsule, Rfl: 3 .  doxycycline (VIBRA-TABS) 100 MG tablet, Take 1 tablet (100 mg total) by mouth every 12 (twelve) hours., Disp: 10 tablet, Rfl: 0 .  feeding supplement, GLUCERNA SHAKE, (GLUCERNA SHAKE) LIQD, Take 237 mLs by mouth 2 (two) times daily between meals., Disp: , Rfl: 0 .  Ferrous Sulfate Dried 200 (65 FE) MG TABS, Take 1 tablet by mouth daily. , Disp: , Rfl:  .  fluticasone (FLONASE) 50 MCG/ACT nasal spray, Place 2 sprays into both nostrils daily. , Disp: , Rfl:  .  folic acid (FOLVITE) 1 MG  tablet, Take 1 mg by mouth daily. , Disp: , Rfl:  .  furosemide (LASIX) 40 MG tablet, Take 1 tablet (40 mg total) by mouth 2 (two) times daily., Disp: , Rfl:  .  gabapentin (NEURONTIN) 100 MG capsule, Take 1 capsule (100 mg total) by mouth 3 (three) times daily., Disp: , Rfl:  .  hydrocerin (EUCERIN) CREA, Apply Eucerin cream to BLE Q day after bathing and roughly towel drying to remove loose skin, Disp: 228 g, Rfl: 0 .  metoprolol tartrate (LOPRESSOR) 25 MG tablet, Take 1 tablet (25 mg total) by mouth 2 (two) times daily., Disp: , Rfl:  .  potassium chloride SA (K-DUR,KLOR-CON) 20 MEQ tablet, Take 20 mEq by mouth daily., Disp: , Rfl:  .  senna-docusate (SENOKOT-S) 8.6-50 MG per tablet, Take 1 tablet by mouth at bedtime., Disp: , Rfl:  .  sodium chloride (OCEAN) 0.65 % SOLN nasal spray, Place 1-2 sprays into both nostrils every 4 (four) hours as needed for congestion., Disp: , Rfl: 0 .  zolpidem (AMBIEN) 5 MG tablet, Take 1 tablet (5 mg total) by mouth at bedtime., Disp: 30 tablet, Rfl: 0 .  HYDROcodone-acetaminophen (NORCO/VICODIN) 5-325 MG per tablet, Take 1 tablet by mouth every 6 (six) hours as needed for moderate pain. (Patient not taking: Reported on 07/02/2014), Disp: 30 tablet, Rfl: 0  Allergies: No  Known Allergies  Past Medical History, Surgical history, Social history, and Family History were reviewed and updated.  Review of Systems: As above  Physical Exam:  height is 6' 1" (1.854 m) and weight is 219 lb (99.338 kg). His oral temperature is 97.4 F (36.3 C). His blood pressure is 121/56 and his pulse is 73. His respiration is 18.   Chronically ill-appearing African-American gentleman. Head and neck exam shows no ocular or oral lesions. He has no palpable cervical or supraclavicular lymph nodes. Lungs are clear bilaterally. Cardiac exam regular rate and rhythm with no murmurs, rubs or bruits. Abdomen is soft. His spleen tip might be just below the left costal margin. There is no  hepatomegaly. He has no fluid wave. Extremities shows some chronic 1+ edema in his lower extremities. Skin exam is somewhat dry. Neurological exam is nonfocal.  Lab Results  Component Value Date   WBC 27.1 Corrected for nRBC* 07/02/2014   HGB 9.3* 07/02/2014   HCT 30.4* 07/02/2014   MCV 86 07/02/2014   PLT 322 07/02/2014     Chemistry      Component Value Date/Time   NA 139 06/22/2014 0529   NA 138 11/16/2013 0847   K 4.7 06/22/2014 0529   K 4.5 11/16/2013 0847   CL 109 06/22/2014 0529   CO2 24 06/22/2014 0529   CO2 22 11/16/2013 0847   BUN 23 06/22/2014 0529   BUN 20.2 11/16/2013 0847   CREATININE 1.05 06/22/2014 0529   CREATININE 1.2 11/16/2013 0847      Component Value Date/Time   CALCIUM 9.9 06/22/2014 0529   CALCIUM 9.8 11/16/2013 0847   ALKPHOS 84 06/22/2014 0529   ALKPHOS 125 11/16/2013 0847   AST 31 06/22/2014 0529   AST 25 11/16/2013 0847   ALT 11 06/22/2014 0529   ALT 11 11/16/2013 0847   BILITOT 0.6 06/22/2014 0529   BILITOT 0.35 11/16/2013 0847     Blood smear shows increased white blood cells. He has a few myeloblasts. He has immature myeloid cells. He has nucleated red cells. Platelets are well granulated. He has no schistocytes. He has some teardrop red cells. There are no hypersegmented polys.    Impression and Plan: Juan [name] is 74 year old African-American gentleman. He has a known myeloproliferative disorder. He's been tested for BCR/ABL and JAK2 in the past. He's been negative.  Again, he has refused any type of workup.  By his peripheral blood smear, he might be transforming over into an acute myeloid process. Again, we will not know as he will not allow [place] to do any other studies.  His performance status is not that great so I can somehow understand not being aggressive here and being able to intervene.  I think that "time will tell" as well happen. His blood counts actually are doing fairly well. His white cell count is higher but he is not  more anemic and not thrombocytopenic.  I think we can see him back in about 4-6 weeks. He says that he may let us do something in the future but I sincerely doubt that this will ever happen.  I spent about 40 minutes with him.  He just has very little insight as to what is going on with his blood. At least he is in a nursing home that can give him some care.   Volanda Napoleon, MD 2/15/20165:08 PM

## 2014-07-03 ENCOUNTER — Encounter: Payer: Self-pay | Admitting: Internal Medicine

## 2014-07-03 ENCOUNTER — Ambulatory Visit (INDEPENDENT_AMBULATORY_CARE_PROVIDER_SITE_OTHER): Payer: Medicare Other | Admitting: Internal Medicine

## 2014-07-03 ENCOUNTER — Telehealth: Payer: Self-pay | Admitting: Internal Medicine

## 2014-07-03 VITALS — BP 120/81 | HR 78 | Temp 98.2°F

## 2014-07-03 DIAGNOSIS — Z9119 Patient's noncompliance with other medical treatment and regimen: Secondary | ICD-10-CM

## 2014-07-03 DIAGNOSIS — I8312 Varicose veins of left lower extremity with inflammation: Secondary | ICD-10-CM

## 2014-07-03 DIAGNOSIS — I509 Heart failure, unspecified: Secondary | ICD-10-CM

## 2014-07-03 DIAGNOSIS — I5032 Chronic diastolic (congestive) heart failure: Secondary | ICD-10-CM | POA: Diagnosis not present

## 2014-07-03 DIAGNOSIS — J189 Pneumonia, unspecified organism: Secondary | ICD-10-CM

## 2014-07-03 DIAGNOSIS — I872 Venous insufficiency (chronic) (peripheral): Secondary | ICD-10-CM

## 2014-07-03 DIAGNOSIS — I8311 Varicose veins of right lower extremity with inflammation: Secondary | ICD-10-CM | POA: Diagnosis not present

## 2014-07-03 DIAGNOSIS — Z91199 Patient's noncompliance with other medical treatment and regimen due to unspecified reason: Secondary | ICD-10-CM

## 2014-07-03 LAB — HEPATIC FUNCTION PANEL
ALK PHOS: 89 U/L (ref 25–125)
ALT: 11 U/L (ref 10–40)
AST: 23 U/L (ref 14–40)
Bilirubin, Total: 0.3 mg/dL

## 2014-07-03 LAB — BASIC METABOLIC PANEL
BUN: 28 mg/dL — ABNORMAL HIGH (ref 6–23)
CALCIUM: 10.1 mg/dL (ref 8.4–10.5)
CO2: 31 meq/L (ref 19–32)
CREATININE: 1.29 mg/dL (ref 0.40–1.50)
Chloride: 104 mEq/L (ref 96–112)
GFR: 70.29 mL/min (ref >60.00)
GLUCOSE: 87 mg/dL (ref 70–99)
Potassium: 6.1 mEq/L (ref 3.5–5.1)
Sodium: 138 mEq/L (ref 135–145)

## 2014-07-03 NOTE — Assessment & Plan Note (Signed)
No evidence of fluid overload, check a BMP, continue with present care

## 2014-07-03 NOTE — Telephone Encounter (Signed)
Spoke with Suan Halter, RN Supervisor at [facility], informed her of Potassium results and instructed her to hold potassium supplements completely and recheck BMP in one week (okay to fax results), Sam requested that I fax the orders to her at [phone]. Orders with above instructions faxed to her.

## 2014-07-03 NOTE — Assessment & Plan Note (Signed)
Questionable infiltrate on chest x-ray during last admission, now asymptomatic, afebrile, discontinue antibiotics

## 2014-07-03 NOTE — Telephone Encounter (Signed)
Call [facility]: Potassium 6.1, hold potassium supplements completely, check a BMP in one week DX hypertension.  He can either have it checked there and fax results to me or come to the office.

## 2014-07-03 NOTE — Progress Notes (Signed)
Pre visit review using our clinic review tool, if applicable. No additional management support is needed unless otherwise documented below in the visit note. 

## 2014-07-03 NOTE — Progress Notes (Signed)
Subjective:    Patient ID: Juan [name], male    DOB: 06/09/1940, 74 y.o.   MRN: [id number]  DOS:  07/03/2014 Type of visit - description : hospital follow-up Hospital d/c summary reviewed, excerpts:  Admit date: 06/16/2014 Discharge date: 06/25/2014   Recommendations for Outpatient Follow-up:  1. Daily weights 2. Bmet in 1 wk to check renal function and K+ level 3. Skin care, patient to ambulate bid, bed to chair tid, continue physical therapy 4. F/u with oncology to further discuss biopsy, patient refused biopsy during this hospitalization after multiple providers explained to him the importance of getting biopsy for diagnosis and further treatment option. 5. F/u with pmd, consider psych eval, patient does not actively participate medical care.    Hospital Course:   Bone metastasis? Myeloproliferative disease? Cbc with diff does have few blast cell reported. Patient is very poor historian? in denial? Son does not know the detail either, reported patient is not very forthcoming with medical problem in the past. oncology consulted, ct guided bone biopsy ordered by oncology. Patient refused to have biopsy done. Outpatient follow up with oncology.  Bilateral patchy infiltrate in lung, edema? Atypical infection? Sputum culture no growth, switched to doxycycline orally on 2/4 to minimize volume.   Chronic diastolic chf. Continue lasix. Monitor volume status. No obvious lower extremity today on exam N/v, resolved.   kub no obstruction, Ct chest showed hard stool in colon, bowel regimen. Prn reglan, antiemetics.   RN reported possible bowel incontinence, with weakness and ? Cancer, mri l spine no cord compression.  Generalized weakness, patient does not participate care actively. Passive range of motion, ambulation, bed to chair tid ordered. Continue pt/ot/skin care.  FTT, bilateral heal pressure ulcer, dressing, offloading measures. PT/OT/SNF, talked to son, son agree with snf. Patient  currently state he think snf if a reasonable choice. Patient refused biopsy which was scheduled on 2/8. He is to be discharged to SNF, patient want more time to think about it. Outpatient follow up with oncology.  bilateral heal pressure ulcer, patient has not been able to walk and does not seem to be motivated to get up either in the last several months. He reported his heal has been raw prior to this admission. Will need off loading measures/skin care/pt/ot.  Consider psychiatric eval, patient does not actively participate in care or daily activity even with maximum attempt from medical staff to encourage him to do so.  Patient family aware that patient not forthcoming with medical information, the concerning for cancer diagnosis. Family agree with snf placement.    Review of Systems The patient was discharge to [facility], he reports that he is doing well, "they take good care of me". Denies fevers, appetite is fair Denies chest pain, lower extremity edema is decreased. "I still have shortness of breath sometimes". Occasional cough with whitish sputum production, no hemoptysis.  Past Medical History  Diagnosis Date  . CAD (coronary artery disease)     dx elsewheer in past, no documentation. Non-ischemic myovue 2007  . CHF (congestive heart failure)     dx elsewhere, no documentation. Normal EF by previous echo. Trival AS needs f/u ECHO by 2012  . Hypertension   . Sleep apnea, obstructive     at some point used CPAP, was d/c  years ago  . Anemia   . History of thrombocytosis   . Allergic rhinitis   . Edema     R>L leg, u/s 5-12 neg for DVT  . Hemorrhoid   .  Type II diabetes mellitus   . Sinus congestion   . Migraine     "once/wk at least" (07/11/2013)  . Shortness of breath   . Myeloproliferative disease   . Ascending aortic aneurysm 03/2014    4.3cm on CT scan    Past Surgical History  Procedure Laterality Date  . Toe surgery Right     "tried to straighten out big toe"  (07/11/2013)    History   Social History  . Marital Status: Widowed    Spouse Name: N/A  . Number of Children: 2  . Years of Education: N/A   Occupational History  . retired    .     Social History Main Topics  . Smoking status: Former Smoker -- 0.25 packs/day for 12 years    Types: Cigarettes    Quit date: 05/18/1966  . Smokeless tobacco: Never Used     Comment: quit smoking 45 years ago  . Alcohol Use: No  . Drug Use: No  . Sexual Activity: Yes   Other Topics Concern  . Not on file   Social History Narrative   Moved from [place] 2006   Widow 2007   Son lives with him         Medication List       This list is accurate as of: 07/03/14  7:17 PM.  Always use your most recent med list.               acetaminophen 500 MG tablet  Commonly known as:  TYLENOL  Take 500-1,000 mg by mouth every 6 (six) hours as needed for moderate pain or headache.     aspirin 325 MG tablet  Take 1 tablet (325 mg total) by mouth daily.     b complex vitamins capsule  Take 1 capsule by mouth daily.     feeding supplement (GLUCERNA SHAKE) Liqd  Take 237 mLs by mouth 2 (two) times daily between meals.     Ferrous Sulfate Dried 200 (65 FE) MG Tabs  Take 1 tablet by mouth daily.     fluticasone 50 MCG/ACT nasal spray  Commonly known as:  FLONASE  Place 2 sprays into both nostrils daily.     folic acid 1 MG tablet  Commonly known as:  FOLVITE  Take 1 mg by mouth daily.     furosemide 40 MG tablet  Commonly known as:  LASIX  Take 1 tablet (40 mg total) by mouth 2 (two) times daily.     gabapentin 100 MG capsule  Commonly known as:  NEURONTIN  Take 1 capsule (100 mg total) by mouth 3 (three) times daily.     hydrocerin Crea  Apply Eucerin cream to BLE Q day after bathing and roughly towel drying to remove loose skin     HYDROcodone-acetaminophen 5-325 MG per tablet  Commonly known as:  NORCO/VICODIN  Take 1 tablet by mouth every 6 (six) hours as needed for moderate pain.       LORazepam 0.5 MG tablet  Commonly known as:  ATIVAN  Take 0.5 mg by mouth every 6 (six) hours as needed for anxiety.     metoprolol tartrate 25 MG tablet  Commonly known as:  LOPRESSOR  Take 1 tablet (25 mg total) by mouth 2 (two) times daily.     potassium chloride SA 20 MEQ tablet  Commonly known as:  K-DUR,KLOR-CON  Take 20 mEq by mouth daily.     senna-docusate 8.6-50 MG per tablet  Commonly  known as:  Senokot-S  Take 1 tablet by mouth at bedtime.     sodium chloride 0.65 % Soln nasal spray  Commonly known as:  OCEAN  Place 1-2 sprays into both nostrils every 4 (four) hours as needed for congestion.     zolpidem 5 MG tablet  Commonly known as:  AMBIEN  Take 1 tablet (5 mg total) by mouth at bedtime.           Objective:   Physical Exam BP 120/81 mmHg  Pulse 78  Temp(Src) 98.2 F (36.8 C) (Oral)  Wt   SpO2 97%  General:   Well developed, well nourished . NAD.  HEENT:  Normocephalic . Face symmetric, atraumatic Lungs:  CTA B Normal respiratory effort, no intercostal retractions, no accessory muscle use. Heart: RRR,  no murmur.  Muscle skeletal: no pretibial edema bilaterally ; skin is   slightly hyperpigmented but no TTP, red or warm.   Neurologic:  alert .Speech fluent, sitting in a wheelchair. Psych--  Seems at baseline, his insight is poor, the only concern he has today is for me to sign his paperwork, he repeated that several times.     Assessment & Plan:

## 2014-07-03 NOTE — Patient Instructions (Signed)
To [facility] staff:  Continue with the same medications except doxycycline, we can stop it.  Plan the care off care is the same.  If he has a doctor who can see  him at [facility] he does not need to come back to this office otherwise follow-up in 3 months

## 2014-07-03 NOTE — Assessment & Plan Note (Signed)
Seems to be doing great, no evidence of cellulitis

## 2014-07-03 NOTE — Assessment & Plan Note (Signed)
Continue w/ poor compliance with advise, he was seen by psychiatry 04-07-14 at the hospital and he was deemed to be competent to make his own decisions He is refusing further oncology investigations. At this point,  evidently is getting excellent care at [facility], I  have never seen him as well taking care off as today. For now I think he is a stable, continue with present care and conservative treatment of his multiple medical problems.

## 2014-07-04 NOTE — Telephone Encounter (Signed)
Tried faxing orders twice to fax number given, called [facility] to clarify fax number, correct fax number [phone]. Orders refaxed to [facility].

## 2014-07-04 NOTE — Telephone Encounter (Signed)
Once again unable to get fax through to number given. Per their online website fax number is [phone]. Received fax confirmation on 07/04/2014 at 13:58.

## 2014-07-05 ENCOUNTER — Encounter: Payer: Self-pay | Admitting: Hematology & Oncology

## 2014-07-10 LAB — HEMOGLOBIN A1C: Hgb A1c MFr Bld: 5.6 % (ref 4.0–6.0)

## 2014-07-12 ENCOUNTER — Telehealth: Payer: Self-pay | Admitting: Internal Medicine

## 2014-07-12 NOTE — Telephone Encounter (Signed)
Please call the facility he is living at, we needed to stop potassium and recheck a BMP. Results?

## 2014-07-13 NOTE — Telephone Encounter (Signed)
Fax sent to [facility], ATTN: Suan Halter regarding Pts BMP results that were supposed to be checked after holding Pts potassium for one week. Informed her to fax [place] those results, ASAP.

## 2014-07-13 NOTE — Telephone Encounter (Signed)
[facility] called asking to speak with [facility] regarding this patient

## 2014-07-13 NOTE — Telephone Encounter (Signed)
Number to call Suan Halter back?

## 2014-07-14 LAB — BASIC METABOLIC PANEL
BUN: 33 mg/dL — AB (ref 4–21)
Creatinine: 1.4 mg/dL — AB (ref <=1.3)
GLUCOSE: 97 mg/dL
POTASSIUM: 5.3 mmol/L (ref 3.4–5.3)
Sodium: 139 mmol/L (ref 137–147)

## 2014-07-16 NOTE — Telephone Encounter (Signed)
BMP 07/14/2014 show a creatinine of 1.3. Potassium 5.3 which is normal. Patient currently is not taking potassium but takes Lasix, unclear why potassium is in the high side of normal despite taking Lasix. Plan: Call the facility, discontinue potassium permanently,  continue with all the other medications. Check a BMP in one month

## 2014-07-16 NOTE — Telephone Encounter (Signed)
Instructions faxed to [facility] regarding d/c Potassium permanently, and continue all other medications. Informed to recheck BMP in one month and fax results to [place].

## 2014-07-17 NOTE — Telephone Encounter (Signed)
Pt was seen by Dr. Larose Kells on 07/03/14.

## 2014-07-24 DIAGNOSIS — R1310 Dysphagia, unspecified: Secondary | ICD-10-CM | POA: Diagnosis not present

## 2014-07-24 DIAGNOSIS — I509 Heart failure, unspecified: Secondary | ICD-10-CM | POA: Diagnosis not present

## 2014-07-24 DIAGNOSIS — F411 Generalized anxiety disorder: Secondary | ICD-10-CM | POA: Diagnosis not present

## 2014-07-25 LAB — CBC AND DIFFERENTIAL
HEMATOCRIT: 31 % — AB (ref 41–53)
Hemoglobin: 9.6 g/dL — AB (ref 13.5–17.5)
Platelets: 367 10*3/uL (ref 150–399)
WBC: 23.1 10^3/mL

## 2014-07-30 ENCOUNTER — Ambulatory Visit (HOSPITAL_BASED_OUTPATIENT_CLINIC_OR_DEPARTMENT_OTHER): Payer: Medicare Other | Admitting: Hematology & Oncology

## 2014-07-30 ENCOUNTER — Other Ambulatory Visit (HOSPITAL_BASED_OUTPATIENT_CLINIC_OR_DEPARTMENT_OTHER): Payer: Medicare Other | Admitting: Lab

## 2014-07-30 ENCOUNTER — Encounter: Payer: Self-pay | Admitting: Hematology & Oncology

## 2014-07-30 VITALS — BP 124/60 | HR 62 | Temp 97.5°F | Resp 18 | Ht 72.0 in

## 2014-07-30 DIAGNOSIS — D47Z9 Other specified neoplasms of uncertain behavior of lymphoid, hematopoietic and related tissue: Secondary | ICD-10-CM

## 2014-07-30 DIAGNOSIS — D471 Chronic myeloproliferative disease: Secondary | ICD-10-CM

## 2014-07-30 LAB — MANUAL DIFFERENTIAL (CHCC SATELLITE)
ALC: 6.3 10*3/uL — AB (ref 0.9–3.3)
ANC (CHCC HP manual diff): 15.7 10*3/uL — ABNORMAL HIGH (ref 1.5–6.5)
BASO: 3 % — ABNORMAL HIGH (ref 0–2)
Band Neutrophils: 10 % (ref 0–10)
Blasts: 3 % — ABNORMAL HIGH (ref 0–0)
Eos: 4 % (ref 0–7)
LYMPH: 24 % (ref 14–48)
MONO: 5 % (ref 0–13)
MYELOCYTES: 15 % — AB (ref 0–0)
Metamyelocytes: 6 % — ABNORMAL HIGH (ref 0–0)
PLT EST ~~LOC~~: ADEQUATE
PROMYELO: 1 % — ABNORMAL HIGH (ref 0–0)
SEG: 29 % — ABNORMAL LOW (ref 40–75)
nRBC: 8 % — ABNORMAL HIGH (ref 0–0)

## 2014-07-30 LAB — CMP (CANCER CENTER ONLY)
ALK PHOS: 88 U/L — AB (ref 26–84)
ALT(SGPT): 10 U/L (ref 10–47)
AST: 30 U/L (ref 11–38)
Albumin: 3.2 g/dL — ABNORMAL LOW (ref 3.3–5.5)
BUN, Bld: 21 mg/dL (ref 7–22)
CALCIUM: 9.6 mg/dL (ref 8.0–10.3)
CO2: 28 mEq/L (ref 18–33)
Chloride: 105 mEq/L (ref 98–108)
Creat: 1.3 mg/dl — ABNORMAL HIGH (ref 0.6–1.2)
GLUCOSE: 109 mg/dL (ref 73–118)
Potassium: 5.1 mEq/L — ABNORMAL HIGH (ref 3.3–4.7)
SODIUM: 143 meq/L (ref 128–145)
Total Bilirubin: 0.5 mg/dl (ref 0.20–1.60)
Total Protein: 7 g/dL (ref 6.4–8.1)

## 2014-07-30 LAB — CBC WITH DIFFERENTIAL (CANCER CENTER ONLY)
HCT: 29.3 % — ABNORMAL LOW (ref 38.7–49.9)
HEMOGLOBIN: 9.1 g/dL — AB (ref 13.0–17.1)
MCH: 26.9 pg — AB (ref 28.0–33.4)
MCHC: 31.1 g/dL — ABNORMAL LOW (ref 32.0–35.9)
MCV: 87 fL (ref 82–98)
Platelets: 368 10*3/uL (ref 145–400)
RBC: 3.38 10*6/uL — AB (ref 4.20–5.70)
RDW: 18.7 % — ABNORMAL HIGH (ref 11.1–15.7)
WBC: 26.1 10*3/uL — ABNORMAL HIGH (ref 4.0–10.0)

## 2014-07-30 LAB — CHCC SATELLITE - SMEAR

## 2014-07-30 LAB — LACTATE DEHYDROGENASE: LDH: 1664 U/L — AB (ref 94–250)

## 2014-07-30 NOTE — Progress Notes (Signed)
Hematology and Oncology Follow Up Visit  Juan [name] [id number] July 10, 1940 74 y.o. 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  Heritage is back for his second office visit. He actually looks quite good. He is in a wheelchair. He does not look as swollen as before.  He is not hurting.  He is eating fairly well. He does have high blood sugars.  He's had no bleeding. He's had no fever. He's had no nausea or vomiting.  He's not hurting much.  He says that he may be going home soon. Hopefully, he will be over to do okay at home and has family can help him. Overall, his performance status is ECOG 3. Medications:  Current outpatient prescriptions:  .  acetaminophen (TYLENOL) 500 MG tablet, Take 500-1,000 mg by mouth every 6 (six) hours as needed for moderate pain or headache. , Disp: , Rfl:  .  aspirin 325 MG tablet, Take 1 tablet (325 mg total) by mouth daily., Disp: , Rfl:  .  b complex vitamins capsule, Take 1 capsule by mouth daily., Disp: 30 capsule, Rfl: 3 .  feeding supplement, GLUCERNA SHAKE, (GLUCERNA SHAKE) LIQD, Take 237 mLs by mouth 2 (two) times daily between meals., Disp: , Rfl: 0 .  Ferrous Sulfate Dried 200 (65 FE) MG TABS, Take 1 tablet by mouth daily. , Disp: , Rfl:  .  fluticasone (FLONASE) 50 MCG/ACT nasal spray, Place 2 sprays into both nostrils daily. , Disp: , Rfl:  .  folic acid (FOLVITE) 1 MG tablet, Take 1 mg by mouth daily. , Disp: , Rfl:  .  furosemide (LASIX) 40 MG tablet, Take 1 tablet (40 mg total) by mouth 2 (two) times daily., Disp: , Rfl:  .  gabapentin (NEURONTIN) 100 MG capsule, Take 1 capsule (100 mg total) by mouth 3 (three) times daily., Disp: , Rfl:  .  hydrocerin (EUCERIN) CREA, Apply Eucerin cream to BLE Q day after bathing and roughly towel drying to remove loose skin, Disp: 228 g, Rfl: 0 .  metoprolol tartrate (LOPRESSOR) 25  MG tablet, Take 1 tablet (25 mg total) by mouth 2 (two) times daily., Disp: , Rfl:  .  senna-docusate (SENOKOT-S) 8.6-50 MG per tablet, Take 1 tablet by mouth at bedtime., Disp: , Rfl:  .  sertraline (ZOLOFT) 50 MG tablet, Take 50 mg by mouth daily., Disp: , Rfl:  .  sodium chloride (OCEAN) 0.65 % SOLN nasal spray, Place 1-2 sprays into both nostrils every 4 (four) hours as needed for congestion., Disp: , Rfl: 0 .  HYDROcodone-acetaminophen (NORCO/VICODIN) 5-325 MG per tablet, Take 1 tablet by mouth every 6 (six) hours as needed for moderate pain. (Patient not taking: Reported on 07/30/2014), Disp: 30 tablet, Rfl: 0 .  LORazepam (ATIVAN) 0.5 MG tablet, Take 0.5 mg by mouth every 6 (six) hours as needed for anxiety., Disp: , Rfl:   Allergies: No Known Allergies  Past Medical History, Surgical history, Social history, and Family History were reviewed and updated.  Review of Systems: As above  Physical Exam:  height is 6' (1.829 m). His oral temperature is 97.5 F (36.4 C). His blood pressure is 124/60 and his pulse is 62. His respiration is 18.   Chronically ill-appearing African-American gentleman. Head and neck exam shows no ocular or oral lesions. He has no palpable cervical or supraclavicular lymph nodes. Lungs are clear bilaterally. Cardiac exam  regular rate and rhythm with no murmurs, rubs or bruits. Abdomen is soft. His spleen tip might be just below the left costal margin. There is no hepatomegaly. He has no fluid wave. Extremities shows some chronic 1+ edema in his lower extremities. Skin exam is somewhat dry. Neurological exam is nonfocal.  Lab Results  Component Value Date   WBC 26.1 Corrected for nRBC* 07/30/2014   HGB 9.1* 07/30/2014   HCT 29.3* 07/30/2014   MCV 87 07/30/2014   PLT 368 07/30/2014     Chemistry      Component Value Date/Time   NA 143 07/30/2014 1202   NA 139 07/14/2014   NA 138 07/03/2014 1214   NA 138 11/16/2013 0847   K 5.1* 07/30/2014 1202   K 5.3  07/14/2014   K 4.5 11/16/2013 0847   CL 105 07/30/2014 1202   CL 104 07/03/2014 1214   CO2 28 07/30/2014 1202   CO2 31 07/03/2014 1214   CO2 22 11/16/2013 0847   BUN 21 07/30/2014 1202   BUN 33* 07/14/2014   BUN 28* 07/03/2014 1214   BUN 20.2 11/16/2013 0847   CREATININE 1.3* 07/30/2014 1202   CREATININE 1.4* 07/14/2014   CREATININE 1.29 07/03/2014 1214   CREATININE 1.2 11/16/2013 0847   GLU 97 07/14/2014      Component Value Date/Time   CALCIUM 9.6 07/30/2014 1202   CALCIUM 10.1 07/03/2014 1214   CALCIUM 9.8 11/16/2013 0847   ALKPHOS 88* 07/30/2014 1202   ALKPHOS 84 06/22/2014 0529   ALKPHOS 125 11/16/2013 0847   AST 30 07/30/2014 1202   AST 31 06/22/2014 0529   AST 25 11/16/2013 0847   ALT 10 07/30/2014 1202   ALT 11 06/22/2014 0529   ALT 11 11/16/2013 0847   BILITOT 0.50 07/30/2014 1202   BILITOT 0.6 06/22/2014 0529   BILITOT 0.35 11/16/2013 0847     Blood smear shows increased white blood cells. He has a few myeloblasts. He has immature myeloid cells. He has nucleated red cells. Platelets are well granulated. He has no schistocytes. He has some teardrop red cells. There are no hypersegmented polys.    Impression and Plan: Mr. Goettl is 74 year old African-American gentleman. He has a known myeloproliferative disorder. He's been tested for BCR/ABL and JAK2 in the past. He's been negative.  Again, he has refused any type of workup.  By his peripheral blood smear, he is probably transforming over into an acute myeloid process. He does have some myeloblasts on the smear. Again, we will not know if he truly is leukemic as he will not allow [place] to do any other studies.  His performance status is not that great so I can somehow understand not being aggressive here and being able to intervene.  I am glad that his blood counts are holding pretty steady. This hopefully is a good sign.  Again, there is nothing that we have to do right now. He sees we don't pretty well at the  nursing home. He says that he may be going home soon.  He is on iron pills. I'll see that has to be on these. They are not helping him.  I want to see him back in about 2 months now. Is still Lavenia Atlas me if he and some in the hospital with some complication.   Volanda Napoleon, MD 3/14/20161:10 PM

## 2014-07-31 LAB — BASIC METABOLIC PANEL
BUN: 23 mg/dL — AB (ref 4–21)
CREATININE: 1.2 mg/dL (ref 0.6–1.3)
Glucose: 110 mg/dL
Potassium: 5.1 mmol/L (ref 3.4–5.3)
Sodium: 138 mmol/L (ref 137–147)

## 2014-08-01 DIAGNOSIS — N390 Urinary tract infection, site not specified: Secondary | ICD-10-CM | POA: Diagnosis not present

## 2014-08-06 ENCOUNTER — Telehealth: Payer: Self-pay | Admitting: *Deleted

## 2014-08-06 NOTE — Telephone Encounter (Signed)
Received home health certification and plan of care via mail from [facility] and [place]. Forwarded to Dr. Larose Kells. JG//CMA

## 2014-08-07 DIAGNOSIS — I2510 Atherosclerotic heart disease of native coronary artery without angina pectoris: Secondary | ICD-10-CM | POA: Diagnosis not present

## 2014-08-07 DIAGNOSIS — N183 Chronic kidney disease, stage 3 (moderate): Secondary | ICD-10-CM | POA: Diagnosis not present

## 2014-08-07 DIAGNOSIS — E119 Type 2 diabetes mellitus without complications: Secondary | ICD-10-CM | POA: Diagnosis not present

## 2014-08-07 DIAGNOSIS — I5032 Chronic diastolic (congestive) heart failure: Secondary | ICD-10-CM | POA: Diagnosis not present

## 2014-08-07 DIAGNOSIS — I129 Hypertensive chronic kidney disease with stage 1 through stage 4 chronic kidney disease, or unspecified chronic kidney disease: Secondary | ICD-10-CM | POA: Diagnosis not present

## 2014-08-07 DIAGNOSIS — I872 Venous insufficiency (chronic) (peripheral): Secondary | ICD-10-CM | POA: Diagnosis not present

## 2014-08-07 NOTE — Telephone Encounter (Signed)
Signed documents mailed back to [place]. Copy sent for scanning. JG//CMA

## 2014-08-08 DIAGNOSIS — E119 Type 2 diabetes mellitus without complications: Secondary | ICD-10-CM | POA: Diagnosis not present

## 2014-08-08 DIAGNOSIS — I2510 Atherosclerotic heart disease of native coronary artery without angina pectoris: Secondary | ICD-10-CM | POA: Diagnosis not present

## 2014-08-08 DIAGNOSIS — I872 Venous insufficiency (chronic) (peripheral): Secondary | ICD-10-CM | POA: Diagnosis not present

## 2014-08-08 DIAGNOSIS — N183 Chronic kidney disease, stage 3 (moderate): Secondary | ICD-10-CM | POA: Diagnosis not present

## 2014-08-08 DIAGNOSIS — I5032 Chronic diastolic (congestive) heart failure: Secondary | ICD-10-CM | POA: Diagnosis not present

## 2014-08-08 DIAGNOSIS — I129 Hypertensive chronic kidney disease with stage 1 through stage 4 chronic kidney disease, or unspecified chronic kidney disease: Secondary | ICD-10-CM | POA: Diagnosis not present

## 2014-08-10 DIAGNOSIS — I129 Hypertensive chronic kidney disease with stage 1 through stage 4 chronic kidney disease, or unspecified chronic kidney disease: Secondary | ICD-10-CM | POA: Diagnosis not present

## 2014-08-10 DIAGNOSIS — I2510 Atherosclerotic heart disease of native coronary artery without angina pectoris: Secondary | ICD-10-CM | POA: Diagnosis not present

## 2014-08-10 DIAGNOSIS — E119 Type 2 diabetes mellitus without complications: Secondary | ICD-10-CM | POA: Diagnosis not present

## 2014-08-10 DIAGNOSIS — I5032 Chronic diastolic (congestive) heart failure: Secondary | ICD-10-CM | POA: Diagnosis not present

## 2014-08-10 DIAGNOSIS — N183 Chronic kidney disease, stage 3 (moderate): Secondary | ICD-10-CM | POA: Diagnosis not present

## 2014-08-10 DIAGNOSIS — I872 Venous insufficiency (chronic) (peripheral): Secondary | ICD-10-CM | POA: Diagnosis not present

## 2014-08-13 ENCOUNTER — Telehealth: Payer: Self-pay | Admitting: Internal Medicine

## 2014-08-13 DIAGNOSIS — I5032 Chronic diastolic (congestive) heart failure: Secondary | ICD-10-CM | POA: Diagnosis not present

## 2014-08-13 DIAGNOSIS — I2510 Atherosclerotic heart disease of native coronary artery without angina pectoris: Secondary | ICD-10-CM | POA: Diagnosis not present

## 2014-08-13 DIAGNOSIS — I129 Hypertensive chronic kidney disease with stage 1 through stage 4 chronic kidney disease, or unspecified chronic kidney disease: Secondary | ICD-10-CM | POA: Diagnosis not present

## 2014-08-13 DIAGNOSIS — E119 Type 2 diabetes mellitus without complications: Secondary | ICD-10-CM | POA: Diagnosis not present

## 2014-08-13 DIAGNOSIS — I872 Venous insufficiency (chronic) (peripheral): Secondary | ICD-10-CM | POA: Diagnosis not present

## 2014-08-13 DIAGNOSIS — N183 Chronic kidney disease, stage 3 (moderate): Secondary | ICD-10-CM | POA: Diagnosis not present

## 2014-08-13 NOTE — Telephone Encounter (Signed)
Pt has been discharged from [facility] on 08/01/2014. Please advise.

## 2014-08-13 NOTE — Telephone Encounter (Signed)
Caller name: [place], [place] Relation to pt: self  Call back number: [phone] Pharmacy: [facility] [phone]  Reason for call:  Pt was seen by Dr. Christiane Ha T. Felipa Eth, MD from [facility] phone # [phone]. Pt said he was given an injection which caused him to have a bladder infection. Pt stated the nurse from the internist office advised pt to call PCP requesting antibiotics, pt stated Dr. Larose Kells office is to far and does not have the funds to come in. Advised pt Dr. Larose Kells would have to access him before prescribing him. Pt said Dr. Larose Kells can call [facility] and they will confirm he's DX and explain the medication needed. The patient was adamant about not coming in please advise.

## 2014-08-13 NOTE — Telephone Encounter (Signed)
Please advise 

## 2014-08-13 NOTE — Telephone Encounter (Signed)
Unfortunately, I can't help him with that information, please schedule a office visit (or at least he needs to come and give [place] a urine sample and more information, Fever? Chills?). If symptoms severe, needs to go to a urgent care or ER

## 2014-08-13 NOTE — Telephone Encounter (Signed)
FYI. Unfortunately, per Dr. Larose Kells we will not be able to prescribe any antibiotics without being seen, however, if Pt would like to go to [place] office, see if they have an appt with a provider there. Thanks.

## 2014-08-13 NOTE — Telephone Encounter (Signed)
As far as I know, he is living at [facility], Dr. Felipa Eth is the physician for the facility. Please confirm this information, if that is the case the nursing home needs to contact Dr. Felipa Eth

## 2014-08-14 DIAGNOSIS — I5032 Chronic diastolic (congestive) heart failure: Secondary | ICD-10-CM | POA: Diagnosis not present

## 2014-08-14 DIAGNOSIS — L89313 Pressure ulcer of right buttock, stage 3: Secondary | ICD-10-CM | POA: Diagnosis not present

## 2014-08-14 DIAGNOSIS — D471 Chronic myeloproliferative disease: Secondary | ICD-10-CM | POA: Diagnosis not present

## 2014-08-14 DIAGNOSIS — I129 Hypertensive chronic kidney disease with stage 1 through stage 4 chronic kidney disease, or unspecified chronic kidney disease: Secondary | ICD-10-CM | POA: Diagnosis not present

## 2014-08-14 DIAGNOSIS — E1140 Type 2 diabetes mellitus with diabetic neuropathy, unspecified: Secondary | ICD-10-CM | POA: Diagnosis not present

## 2014-08-14 DIAGNOSIS — I712 Thoracic aortic aneurysm, without rupture: Secondary | ICD-10-CM | POA: Diagnosis not present

## 2014-08-14 NOTE — Telephone Encounter (Signed)
Spoke with pt and voice understanding

## 2014-08-16 DIAGNOSIS — I712 Thoracic aortic aneurysm, without rupture: Secondary | ICD-10-CM | POA: Diagnosis not present

## 2014-08-16 DIAGNOSIS — L89313 Pressure ulcer of right buttock, stage 3: Secondary | ICD-10-CM | POA: Diagnosis not present

## 2014-08-16 DIAGNOSIS — D471 Chronic myeloproliferative disease: Secondary | ICD-10-CM | POA: Diagnosis not present

## 2014-08-16 DIAGNOSIS — I5032 Chronic diastolic (congestive) heart failure: Secondary | ICD-10-CM | POA: Diagnosis not present

## 2014-08-16 DIAGNOSIS — I129 Hypertensive chronic kidney disease with stage 1 through stage 4 chronic kidney disease, or unspecified chronic kidney disease: Secondary | ICD-10-CM | POA: Diagnosis not present

## 2014-08-16 DIAGNOSIS — E1140 Type 2 diabetes mellitus with diabetic neuropathy, unspecified: Secondary | ICD-10-CM | POA: Diagnosis not present

## 2014-08-17 DIAGNOSIS — I712 Thoracic aortic aneurysm, without rupture: Secondary | ICD-10-CM | POA: Diagnosis not present

## 2014-08-17 DIAGNOSIS — L89313 Pressure ulcer of right buttock, stage 3: Secondary | ICD-10-CM | POA: Diagnosis not present

## 2014-08-17 DIAGNOSIS — E1140 Type 2 diabetes mellitus with diabetic neuropathy, unspecified: Secondary | ICD-10-CM | POA: Diagnosis not present

## 2014-08-17 DIAGNOSIS — D471 Chronic myeloproliferative disease: Secondary | ICD-10-CM | POA: Diagnosis not present

## 2014-08-17 DIAGNOSIS — I5032 Chronic diastolic (congestive) heart failure: Secondary | ICD-10-CM | POA: Diagnosis not present

## 2014-08-17 DIAGNOSIS — I129 Hypertensive chronic kidney disease with stage 1 through stage 4 chronic kidney disease, or unspecified chronic kidney disease: Secondary | ICD-10-CM | POA: Diagnosis not present

## 2014-08-20 ENCOUNTER — Telehealth: Payer: Self-pay | Admitting: Internal Medicine

## 2014-08-20 DIAGNOSIS — L89313 Pressure ulcer of right buttock, stage 3: Secondary | ICD-10-CM | POA: Diagnosis not present

## 2014-08-20 DIAGNOSIS — I712 Thoracic aortic aneurysm, without rupture: Secondary | ICD-10-CM | POA: Diagnosis not present

## 2014-08-20 DIAGNOSIS — I129 Hypertensive chronic kidney disease with stage 1 through stage 4 chronic kidney disease, or unspecified chronic kidney disease: Secondary | ICD-10-CM | POA: Diagnosis not present

## 2014-08-20 DIAGNOSIS — E1140 Type 2 diabetes mellitus with diabetic neuropathy, unspecified: Secondary | ICD-10-CM | POA: Diagnosis not present

## 2014-08-20 DIAGNOSIS — D471 Chronic myeloproliferative disease: Secondary | ICD-10-CM | POA: Diagnosis not present

## 2014-08-20 DIAGNOSIS — I5032 Chronic diastolic (congestive) heart failure: Secondary | ICD-10-CM | POA: Diagnosis not present

## 2014-08-20 NOTE — Telephone Encounter (Signed)
Okay with me, they must bring the paperwork and instructions on how to fill it. I will be out of town this week

## 2014-08-20 NOTE — Telephone Encounter (Signed)
Also, I got records from the time he was at the [facility] community: Labs 07/25/2014: Creatinine 1.4, potassium 5.2, WBC 23.1K Diagnosed with UTI 07/24/2014 treated with Cipro for 10 days Labs 07/21/2014: Potassium 5.1 normal, creatinine 1.2. BMP 171 slightly elevated

## 2014-08-20 NOTE — Telephone Encounter (Signed)
Caller name:[facility] Relation to pt: Call back number:[phone] Pharmacy:  Reason for call: [facility] is needing [facility] order for a hospital bed for the pt, faxed to  [phone]

## 2014-08-20 NOTE — Telephone Encounter (Signed)
Okay to print order for Hospital bed? Please advise.

## 2014-08-21 DIAGNOSIS — E1140 Type 2 diabetes mellitus with diabetic neuropathy, unspecified: Secondary | ICD-10-CM | POA: Diagnosis not present

## 2014-08-21 DIAGNOSIS — D471 Chronic myeloproliferative disease: Secondary | ICD-10-CM | POA: Diagnosis not present

## 2014-08-21 DIAGNOSIS — I5032 Chronic diastolic (congestive) heart failure: Secondary | ICD-10-CM | POA: Diagnosis not present

## 2014-08-21 DIAGNOSIS — I712 Thoracic aortic aneurysm, without rupture: Secondary | ICD-10-CM | POA: Diagnosis not present

## 2014-08-21 DIAGNOSIS — I129 Hypertensive chronic kidney disease with stage 1 through stage 4 chronic kidney disease, or unspecified chronic kidney disease: Secondary | ICD-10-CM | POA: Diagnosis not present

## 2014-08-21 DIAGNOSIS — L89313 Pressure ulcer of right buttock, stage 3: Secondary | ICD-10-CM | POA: Diagnosis not present

## 2014-08-21 NOTE — Telephone Encounter (Signed)
Med list reconciled.

## 2014-08-21 NOTE — Addendum Note (Signed)
Addended by: Wilfrid Lund on: 08/21/2014 09:21 AM   Modules accepted: Medications

## 2014-08-21 NOTE — Telephone Encounter (Signed)
Spoke with Vickii Chafe, informed her that Dr. Larose Kells is out of the country for the remainder of the week, informed her that we needed forms faxed to our number. Informed that once she returned to office she would have [place] fax those to Dr. Larose Kells for him to complete.

## 2014-08-23 DIAGNOSIS — D471 Chronic myeloproliferative disease: Secondary | ICD-10-CM | POA: Diagnosis not present

## 2014-08-23 DIAGNOSIS — I5032 Chronic diastolic (congestive) heart failure: Secondary | ICD-10-CM | POA: Diagnosis not present

## 2014-08-23 DIAGNOSIS — I712 Thoracic aortic aneurysm, without rupture: Secondary | ICD-10-CM | POA: Diagnosis not present

## 2014-08-23 DIAGNOSIS — I129 Hypertensive chronic kidney disease with stage 1 through stage 4 chronic kidney disease, or unspecified chronic kidney disease: Secondary | ICD-10-CM | POA: Diagnosis not present

## 2014-08-23 DIAGNOSIS — E1140 Type 2 diabetes mellitus with diabetic neuropathy, unspecified: Secondary | ICD-10-CM | POA: Diagnosis not present

## 2014-08-23 DIAGNOSIS — L89313 Pressure ulcer of right buttock, stage 3: Secondary | ICD-10-CM | POA: Diagnosis not present

## 2014-08-24 DIAGNOSIS — I129 Hypertensive chronic kidney disease with stage 1 through stage 4 chronic kidney disease, or unspecified chronic kidney disease: Secondary | ICD-10-CM | POA: Diagnosis not present

## 2014-08-24 DIAGNOSIS — E1140 Type 2 diabetes mellitus with diabetic neuropathy, unspecified: Secondary | ICD-10-CM | POA: Diagnosis not present

## 2014-08-24 DIAGNOSIS — I5032 Chronic diastolic (congestive) heart failure: Secondary | ICD-10-CM | POA: Diagnosis not present

## 2014-08-24 DIAGNOSIS — L89313 Pressure ulcer of right buttock, stage 3: Secondary | ICD-10-CM | POA: Diagnosis not present

## 2014-08-24 DIAGNOSIS — D471 Chronic myeloproliferative disease: Secondary | ICD-10-CM | POA: Diagnosis not present

## 2014-08-24 DIAGNOSIS — I712 Thoracic aortic aneurysm, without rupture: Secondary | ICD-10-CM | POA: Diagnosis not present

## 2014-08-27 DIAGNOSIS — I129 Hypertensive chronic kidney disease with stage 1 through stage 4 chronic kidney disease, or unspecified chronic kidney disease: Secondary | ICD-10-CM | POA: Diagnosis not present

## 2014-08-27 DIAGNOSIS — D471 Chronic myeloproliferative disease: Secondary | ICD-10-CM | POA: Diagnosis not present

## 2014-08-27 DIAGNOSIS — I712 Thoracic aortic aneurysm, without rupture: Secondary | ICD-10-CM | POA: Diagnosis not present

## 2014-08-27 DIAGNOSIS — E1140 Type 2 diabetes mellitus with diabetic neuropathy, unspecified: Secondary | ICD-10-CM | POA: Diagnosis not present

## 2014-08-27 DIAGNOSIS — I5032 Chronic diastolic (congestive) heart failure: Secondary | ICD-10-CM | POA: Diagnosis not present

## 2014-08-27 DIAGNOSIS — L89313 Pressure ulcer of right buttock, stage 3: Secondary | ICD-10-CM | POA: Diagnosis not present

## 2014-08-28 DIAGNOSIS — D471 Chronic myeloproliferative disease: Secondary | ICD-10-CM | POA: Diagnosis not present

## 2014-08-28 DIAGNOSIS — I5032 Chronic diastolic (congestive) heart failure: Secondary | ICD-10-CM | POA: Diagnosis not present

## 2014-08-28 DIAGNOSIS — I129 Hypertensive chronic kidney disease with stage 1 through stage 4 chronic kidney disease, or unspecified chronic kidney disease: Secondary | ICD-10-CM | POA: Diagnosis not present

## 2014-08-28 DIAGNOSIS — E1140 Type 2 diabetes mellitus with diabetic neuropathy, unspecified: Secondary | ICD-10-CM | POA: Diagnosis not present

## 2014-08-28 DIAGNOSIS — L89313 Pressure ulcer of right buttock, stage 3: Secondary | ICD-10-CM | POA: Diagnosis not present

## 2014-08-28 DIAGNOSIS — I712 Thoracic aortic aneurysm, without rupture: Secondary | ICD-10-CM | POA: Diagnosis not present

## 2014-08-28 NOTE — Telephone Encounter (Signed)
Order for hospital bed received via fax. Form filled out as much as possible and forwarded to Dr. Larose Kells for review/signature. JG//CMA

## 2014-08-28 NOTE — Telephone Encounter (Signed)
Received phone call from [facility]. She stated she would check to see what she needs to do to have order for hospital bed faxed to our office. JG//CMA

## 2014-08-30 ENCOUNTER — Telehealth: Payer: Self-pay | Admitting: *Deleted

## 2014-08-30 DIAGNOSIS — L89313 Pressure ulcer of right buttock, stage 3: Secondary | ICD-10-CM | POA: Diagnosis not present

## 2014-08-30 DIAGNOSIS — D471 Chronic myeloproliferative disease: Secondary | ICD-10-CM | POA: Diagnosis not present

## 2014-08-30 DIAGNOSIS — I712 Thoracic aortic aneurysm, without rupture: Secondary | ICD-10-CM | POA: Diagnosis not present

## 2014-08-30 DIAGNOSIS — I129 Hypertensive chronic kidney disease with stage 1 through stage 4 chronic kidney disease, or unspecified chronic kidney disease: Secondary | ICD-10-CM | POA: Diagnosis not present

## 2014-08-30 DIAGNOSIS — I5032 Chronic diastolic (congestive) heart failure: Secondary | ICD-10-CM | POA: Diagnosis not present

## 2014-08-30 DIAGNOSIS — E1140 Type 2 diabetes mellitus with diabetic neuropathy, unspecified: Secondary | ICD-10-CM | POA: Diagnosis not present

## 2014-08-30 NOTE — Telephone Encounter (Signed)
Received home health certification and plan of care via mail from [facility]. Forwarded to Dr. Larose Kells. JG//CMA

## 2014-08-31 DIAGNOSIS — D471 Chronic myeloproliferative disease: Secondary | ICD-10-CM | POA: Diagnosis not present

## 2014-08-31 DIAGNOSIS — I129 Hypertensive chronic kidney disease with stage 1 through stage 4 chronic kidney disease, or unspecified chronic kidney disease: Secondary | ICD-10-CM | POA: Diagnosis not present

## 2014-08-31 DIAGNOSIS — I5032 Chronic diastolic (congestive) heart failure: Secondary | ICD-10-CM | POA: Diagnosis not present

## 2014-08-31 DIAGNOSIS — L89313 Pressure ulcer of right buttock, stage 3: Secondary | ICD-10-CM | POA: Diagnosis not present

## 2014-08-31 DIAGNOSIS — E1140 Type 2 diabetes mellitus with diabetic neuropathy, unspecified: Secondary | ICD-10-CM | POA: Diagnosis not present

## 2014-08-31 DIAGNOSIS — I712 Thoracic aortic aneurysm, without rupture: Secondary | ICD-10-CM | POA: Diagnosis not present

## 2014-09-04 DIAGNOSIS — E1140 Type 2 diabetes mellitus with diabetic neuropathy, unspecified: Secondary | ICD-10-CM | POA: Diagnosis not present

## 2014-09-04 DIAGNOSIS — I5032 Chronic diastolic (congestive) heart failure: Secondary | ICD-10-CM | POA: Diagnosis not present

## 2014-09-04 DIAGNOSIS — I712 Thoracic aortic aneurysm, without rupture: Secondary | ICD-10-CM | POA: Diagnosis not present

## 2014-09-04 DIAGNOSIS — I129 Hypertensive chronic kidney disease with stage 1 through stage 4 chronic kidney disease, or unspecified chronic kidney disease: Secondary | ICD-10-CM | POA: Diagnosis not present

## 2014-09-04 DIAGNOSIS — L89313 Pressure ulcer of right buttock, stage 3: Secondary | ICD-10-CM | POA: Diagnosis not present

## 2014-09-04 DIAGNOSIS — D471 Chronic myeloproliferative disease: Secondary | ICD-10-CM | POA: Diagnosis not present

## 2014-09-05 NOTE — Telephone Encounter (Signed)
[facility] home [facility] calling back regarding bed request from Dr. Larose Kells- needing chart notes as well as direct letter stating that PT needs hospital bed from [facility].   Contact # [phone]- [phone]

## 2014-09-06 ENCOUNTER — Telehealth: Payer: Self-pay | Admitting: Internal Medicine

## 2014-09-06 NOTE — Telephone Encounter (Addendum)
Please print a letter To whom it may concern,  [name] is a patient of mine with multiple medical problems including diabetes, sleep apnea, hypertension, CAD, CHF. He has a  very limited mobility and will benefit greatly from a hospital bed.

## 2014-09-06 NOTE — Telephone Encounter (Signed)
Signed forms mailed to [place] in self addressed envelope. Copy sent for scanning. JG//CMA

## 2014-09-06 NOTE — Telephone Encounter (Signed)
Is it ok to write letter? Please advise.

## 2014-09-06 NOTE — Telephone Encounter (Signed)
Caller name: [place] Relation to pt: self Call back number: [phone] Pharmacy: [facility] [place], [place].  Reason for call: Pt called requesting rx for HYDROcodone-acetaminophen (NORCO/VICODIN) 5-325 MG and another rx diagram. Pt did not know the correct name of one rx . Pt states to call [facility] to get rx correct name. Pt states daughter is picking up rx tomorrow morning. Please advise

## 2014-09-06 NOTE — Telephone Encounter (Signed)
Letter and OV notes faxed to [facility] at [phone] successfully. JG//CMA

## 2014-09-07 DIAGNOSIS — I5032 Chronic diastolic (congestive) heart failure: Secondary | ICD-10-CM | POA: Diagnosis not present

## 2014-09-07 DIAGNOSIS — L89313 Pressure ulcer of right buttock, stage 3: Secondary | ICD-10-CM | POA: Diagnosis not present

## 2014-09-07 DIAGNOSIS — D471 Chronic myeloproliferative disease: Secondary | ICD-10-CM | POA: Diagnosis not present

## 2014-09-07 DIAGNOSIS — I712 Thoracic aortic aneurysm, without rupture: Secondary | ICD-10-CM | POA: Diagnosis not present

## 2014-09-07 DIAGNOSIS — I129 Hypertensive chronic kidney disease with stage 1 through stage 4 chronic kidney disease, or unspecified chronic kidney disease: Secondary | ICD-10-CM | POA: Diagnosis not present

## 2014-09-07 DIAGNOSIS — E1140 Type 2 diabetes mellitus with diabetic neuropathy, unspecified: Secondary | ICD-10-CM | POA: Diagnosis not present

## 2014-09-07 MED ORDER — HYDROCODONE-ACETAMINOPHEN 5-325 MG PO TABS: 1.0000 | ORAL_TABLET | Freq: Four times a day (QID) | ORAL | Status: DC | PRN

## 2014-09-07 NOTE — Telephone Encounter (Signed)
Pt is requesting refill on Hydrocodone.   Last OV: 07/03/2014 Last Fill: 04/16/2014 # [phone] [id number] UDS: 06/30/2013 Low risk  Please advise.

## 2014-09-07 NOTE — Telephone Encounter (Signed)
I'm unsure as to what a diagram is or to what their referring? Can you possibly give any insight?

## 2014-09-07 NOTE — Telephone Encounter (Signed)
Okay #30 no refills

## 2014-09-07 NOTE — Telephone Encounter (Signed)
We can discuss on RTC if so desire

## 2014-09-07 NOTE — Telephone Encounter (Signed)
I don't know, clarify with the patient's daughter if needed

## 2014-09-07 NOTE — Telephone Encounter (Signed)
Noted  

## 2014-09-07 NOTE — Telephone Encounter (Signed)
Patient states that diagram needs to be called in as well.(please see earlier note) [facility] on [address].   Daughter Joel Cowin will pickup hydrocodone rx

## 2014-09-07 NOTE — Telephone Encounter (Signed)
Rx printed, and placed at front desk for pick up. Please let the Pt and/or his daughter know. Thank you.

## 2014-09-07 NOTE — Telephone Encounter (Signed)
Rx printed, awaiting MD signature.  

## 2014-09-07 NOTE — Telephone Encounter (Signed)
Spoke with Pt and Pt's daughter, I believe the Pt is talking about a diaphragm. 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 Informed him I didn't see where Dr. Larose Kells has ever prescribed him Viagra or Cialis. Informed him that I would speak with Dr. Larose Kells regarding this.

## 2014-09-10 ENCOUNTER — Other Ambulatory Visit: Payer: Self-pay | Admitting: *Deleted

## 2014-09-10 ENCOUNTER — Emergency Department (HOSPITAL_COMMUNITY)
Admission: EM | Admit: 2014-09-10 | Discharge: 2014-09-10 | Disposition: A | Discharge status: Home / Self Care | Payer: Medicare Other | Attending: Emergency Medicine | Admitting: Emergency Medicine

## 2014-09-10 ENCOUNTER — Encounter (HOSPITAL_COMMUNITY): Payer: Self-pay | Admitting: *Deleted

## 2014-09-10 DIAGNOSIS — Z7951 Long term (current) use of inhaled steroids: Secondary | ICD-10-CM | POA: Diagnosis not present

## 2014-09-10 DIAGNOSIS — I509 Heart failure, unspecified: Secondary | ICD-10-CM | POA: Diagnosis not present

## 2014-09-10 DIAGNOSIS — L03116 Cellulitis of left lower limb: Secondary | ICD-10-CM | POA: Diagnosis not present

## 2014-09-10 DIAGNOSIS — G43909 Migraine, unspecified, not intractable, without status migrainosus: Secondary | ICD-10-CM | POA: Insufficient documentation

## 2014-09-10 DIAGNOSIS — L03115 Cellulitis of right lower limb: Secondary | ICD-10-CM | POA: Diagnosis not present

## 2014-09-10 DIAGNOSIS — R600 Localized edema: Secondary | ICD-10-CM | POA: Diagnosis not present

## 2014-09-10 DIAGNOSIS — R52 Pain, unspecified: Secondary | ICD-10-CM | POA: Diagnosis not present

## 2014-09-10 DIAGNOSIS — Z87891 Personal history of nicotine dependence: Secondary | ICD-10-CM | POA: Diagnosis not present

## 2014-09-10 DIAGNOSIS — Z8719 Personal history of other diseases of the digestive system: Secondary | ICD-10-CM | POA: Insufficient documentation

## 2014-09-10 DIAGNOSIS — R609 Edema, unspecified: Secondary | ICD-10-CM

## 2014-09-10 DIAGNOSIS — D649 Anemia, unspecified: Secondary | ICD-10-CM | POA: Diagnosis not present

## 2014-09-10 DIAGNOSIS — Z862 Personal history of diseases of the blood and blood-forming organs and certain disorders involving the immune mechanism: Secondary | ICD-10-CM | POA: Diagnosis not present

## 2014-09-10 DIAGNOSIS — E119 Type 2 diabetes mellitus without complications: Secondary | ICD-10-CM | POA: Insufficient documentation

## 2014-09-10 DIAGNOSIS — M79604 Pain in right leg: Secondary | ICD-10-CM | POA: Diagnosis not present

## 2014-09-10 DIAGNOSIS — I2510 Atherosclerotic heart disease of native coronary artery without angina pectoris: Secondary | ICD-10-CM | POA: Diagnosis not present

## 2014-09-10 DIAGNOSIS — Z9981 Dependence on supplemental oxygen: Secondary | ICD-10-CM | POA: Insufficient documentation

## 2014-09-10 DIAGNOSIS — G4733 Obstructive sleep apnea (adult) (pediatric): Secondary | ICD-10-CM | POA: Insufficient documentation

## 2014-09-10 DIAGNOSIS — M7989 Other specified soft tissue disorders: Secondary | ICD-10-CM | POA: Diagnosis present

## 2014-09-10 DIAGNOSIS — I712 Thoracic aortic aneurysm, without rupture: Secondary | ICD-10-CM

## 2014-09-10 DIAGNOSIS — I7121 Aneurysm of the ascending aorta, without rupture: Secondary | ICD-10-CM

## 2014-09-10 DIAGNOSIS — Z7982 Long term (current) use of aspirin: Secondary | ICD-10-CM | POA: Insufficient documentation

## 2014-09-10 DIAGNOSIS — I10 Essential (primary) hypertension: Secondary | ICD-10-CM | POA: Insufficient documentation

## 2014-09-10 DIAGNOSIS — D72829 Elevated white blood cell count, unspecified: Secondary | ICD-10-CM | POA: Diagnosis not present

## 2014-09-10 LAB — I-STAT CHEM 8, ED
BUN: 42 mg/dL — ABNORMAL HIGH (ref 6–23)
CALCIUM ION: 1.31 mmol/L — AB (ref 1.13–1.30)
Chloride: 106 mmol/L (ref 96–112)
Creatinine, Ser: 1.5 mg/dL — ABNORMAL HIGH (ref 0.50–1.35)
GLUCOSE: 103 mg/dL — AB (ref 70–99)
HEMATOCRIT: 29 % — AB (ref 39.0–52.0)
Hemoglobin: 9.9 g/dL — ABNORMAL LOW (ref 13.0–17.0)
Potassium: 4.7 mmol/L (ref 3.5–5.1)
Sodium: 141 mmol/L (ref 135–145)
TCO2: 22 mmol/L (ref 0–100)

## 2014-09-10 LAB — BASIC METABOLIC PANEL
ANION GAP: 9 (ref 5–15)
BUN: 42 mg/dL — AB (ref 6–23)
CO2: 24 mmol/L (ref 19–32)
Calcium: 9.7 mg/dL (ref 8.4–10.5)
Chloride: 107 mmol/L (ref 96–112)
Creatinine, Ser: 1.43 mg/dL — ABNORMAL HIGH (ref 0.50–1.35)
GFR calc Af Amer: 55 mL/min — ABNORMAL LOW (ref >90)
GFR, EST NON AFRICAN AMERICAN: 47 mL/min — AB (ref >90)
GLUCOSE: 106 mg/dL — AB (ref 70–99)
POTASSIUM: 4.7 mmol/L (ref 3.5–5.1)
Sodium: 140 mmol/L (ref 135–145)

## 2014-09-10 LAB — CBC WITH DIFFERENTIAL/PLATELET
BASOS PCT: 12 % — AB (ref 0–1)
Basophils Absolute: 2.4 10*3/uL — ABNORMAL HIGH (ref 0.0–0.1)
EOS ABS: 0.8 10*3/uL — AB (ref 0.0–0.7)
Eosinophils Relative: 4 % (ref 0–5)
HCT: 29.5 % — ABNORMAL LOW (ref 39.0–52.0)
Hemoglobin: 9.2 g/dL — ABNORMAL LOW (ref 13.0–17.0)
LYMPHS ABS: 4.9 10*3/uL — AB (ref 0.7–4.0)
Lymphocytes Relative: 24 % (ref 12–46)
MCH: 25.5 pg — ABNORMAL LOW (ref 26.0–34.0)
MCHC: 31.2 g/dL (ref 30.0–36.0)
MCV: 81.7 fL (ref 78.0–100.0)
Monocytes Absolute: 1.8 10*3/uL — ABNORMAL HIGH (ref 0.1–1.0)
Monocytes Relative: 9 % (ref 3–12)
Neutro Abs: 10.4 10*3/uL — ABNORMAL HIGH (ref 1.7–7.7)
Neutrophils Relative %: 51 % (ref 43–77)
Platelets: 344 10*3/uL (ref 150–400)
RBC: 3.61 MIL/uL — ABNORMAL LOW (ref 4.22–5.81)
RDW: 19.8 % — ABNORMAL HIGH (ref 11.5–15.5)
WBC: 20.3 10*3/uL — ABNORMAL HIGH (ref 4.0–10.5)

## 2014-09-10 LAB — I-STAT CG4 LACTIC ACID, ED: Lactic Acid, Venous: 0.68 mmol/L (ref 0.5–2.0)

## 2014-09-10 LAB — URINALYSIS, ROUTINE W REFLEX MICROSCOPIC
BILIRUBIN URINE: NEGATIVE
Glucose, UA: NEGATIVE mg/dL
Hgb urine dipstick: NEGATIVE
Ketones, ur: NEGATIVE mg/dL
Leukocytes, UA: NEGATIVE
NITRITE: NEGATIVE
PH: 6 (ref 5.0–8.0)
Protein, ur: NEGATIVE mg/dL
SPECIFIC GRAVITY, URINE: 1.011 (ref 1.005–1.030)
Urobilinogen, UA: 0.2 mg/dL (ref 0.0–1.0)

## 2014-09-10 MED ORDER — OXYCODONE-ACETAMINOPHEN 5-325 MG PO TABS
1.0000 | ORAL_TABLET | Freq: Once | ORAL | Status: AC
  Administered 2014-09-10: 1 via ORAL
  Filled 2014-09-10: qty 1

## 2014-09-10 MED ORDER — DOXYCYCLINE HYCLATE 100 MG PO CAPS: 100.0000 mg | ORAL_CAPSULE | Freq: Two times a day (BID) | ORAL | Status: DC

## 2014-09-10 MED ORDER — DOXYCYCLINE HYCLATE 100 MG PO TABS
100.0000 mg | ORAL_TABLET | Freq: Once | ORAL | Status: AC
  Administered 2014-09-10: 100 mg via ORAL
  Filled 2014-09-10: qty 1

## 2014-09-10 MED ORDER — SODIUM CHLORIDE 0.9 % IV SOLN: INTRAVENOUS | Status: DC

## 2014-09-10 NOTE — ED Provider Notes (Signed)
CSN: [id number]     Arrival date & time 09/10/14  1836 History   First MD Initiated Contact with Patient 09/10/14 1840     Chief Complaint  Patient presents with  . Leg Swelling     (Consider location/radiation/quality/duration/timing/severity/associated sxs/prior Treatment) HPI   Juan [name] is a 74 y.o. male who presents for evaluation of leg pain, generalized myalgias, chills and fever for several days. He reports his legs had more redness and drainage, then usual. He has not seen his PCP, recently. He states that he is taking his usual medications without improvement. He has had mild cough recently. He denies nausea or vomiting. He has a chronic gait disorder, and uses a wheelchair. There are no other known modifying factors.  Past Medical History  Diagnosis Date  . CAD (coronary artery disease)     dx elsewheer in past, no documentation. Non-ischemic myovue 2007  . CHF (congestive heart failure)     dx elsewhere, no documentation. Normal EF by previous echo. Trival AS needs f/u ECHO by 2012  . Hypertension   . Sleep apnea, obstructive     at some point used CPAP, was d/c  years ago  . Anemia   . History of thrombocytosis   . Allergic rhinitis   . Edema     R>L leg, u/s 5-12 neg for DVT  . Hemorrhoid   . Type II diabetes mellitus   . Sinus congestion   . Migraine     "once/wk at least" (07/11/2013)  . Shortness of breath   . Myeloproliferative disease   . Ascending aortic aneurysm 03/2014    4.3cm on CT scan   Past Surgical History  Procedure Laterality Date  . Toe surgery Right     "tried to straighten out big toe" (07/11/2013)   Family History  Problem Relation Age of Onset  . Colon cancer Neg Hx   . Prostate cancer Neg Hx   . Heart attack Neg Hx   . Diabetes Neg Hx   . Schizophrenia Son    History  Substance Use Topics  . Smoking status: Former Smoker -- 0.25 packs/day for 12 years    Types: Cigarettes    Quit date: 05/18/1966  . Smokeless tobacco: Never  Used     Comment: quit smoking 45 years ago  . Alcohol Use: No    Review of Systems  All other systems reviewed and are negative.     Allergies  Review of patient's allergies indicates no known allergies.  Home Medications   Prior to Admission medications   Medication Sig Start Date End Date Taking? Authorizing Provider  acetaminophen (TYLENOL) 500 MG tablet Take 500-1,000 mg by mouth every 6 (six) hours as needed for moderate pain or headache.     Historical Provider, MD  aspirin 325 MG tablet Take 1 tablet (325 mg total) by mouth daily. 04/16/14   Ripudeep Krystal Eaton, MD  b complex vitamins capsule Take 1 capsule by mouth daily. 01/12/13   Irene Pap, NP  feeding supplement, GLUCERNA SHAKE, (GLUCERNA SHAKE) LIQD Take 237 mLs by mouth 2 (two) times daily between meals. 04/10/14   Barton Dubois, MD  fluticasone (FLONASE) 50 MCG/ACT nasal spray Place 1 spray into both nostrils daily.     Historical Provider, MD  folic acid (FOLVITE) 1 MG tablet Take 1 mg by mouth daily.     Historical Provider, MD  furosemide (LASIX) 40 MG tablet Take 1 tablet (40 mg total) by mouth 2 (two)  times daily. Patient taking differently: Take 40 mg by mouth daily.  04/10/14   Barton Dubois, MD  gabapentin (NEURONTIN) 100 MG capsule Take 1 capsule (100 mg total) by mouth 3 (three) times daily. 06/20/14   Debbe Odea, MD  hydrocerin (EUCERIN) CREA Apply Eucerin cream to BLE Q day after bathing and roughly towel drying to remove loose skin 01/27/14   Jonetta Osgood, MD  HYDROcodone-acetaminophen (NORCO/VICODIN) 5-325 MG per tablet Take 1 tablet by mouth every 6 (six) hours as needed for moderate pain. 09/07/14   Colon Branch, MD  LORazepam (ATIVAN) 0.5 MG tablet Take 0.5 mg by mouth every 6 (six) hours as needed for anxiety.    Historical Provider, MD  metoprolol tartrate (LOPRESSOR) 25 MG tablet Take 1 tablet (25 mg total) by mouth 2 (two) times daily. 04/16/14   Ripudeep Krystal Eaton, MD  senna-docusate (SENOKOT-S) 8.6-50  MG per tablet Take 1 tablet by mouth at bedtime. 06/20/14   Debbe Odea, MD  sertraline (ZOLOFT) 50 MG tablet Take 50 mg by mouth daily.    Historical Provider, MD  sodium chloride (OCEAN) 0.65 % SOLN nasal spray Place 1-2 sprays into both nostrils every 4 (four) hours as needed for congestion. Patient not taking: Reported on 08/21/2014 04/10/14   Barton Dubois, MD   BP 139/57 mmHg  Pulse 90  Temp(Src) 99.7 F (37.6 C) (Rectal)  Resp 21  SpO2 98% Physical Exam  Constitutional: He is oriented to person, place, and time. He appears well-developed. He appears distressed (He is uncomfortable).  Elderly, frail  HENT:  Head: Normocephalic and atraumatic.  Right Ear: External ear normal.  Left Ear: External ear normal.  Eyes: Conjunctivae and EOM are normal. Pupils are equal, round, and reactive to light.  Neck: Normal range of motion and phonation normal. Neck supple.  Cardiovascular: Normal rate, regular rhythm and normal heart sounds.   Pulmonary/Chest: Effort normal and breath sounds normal. He exhibits no bony tenderness.  Abdominal: Soft. There is no tenderness.  Musculoskeletal: Normal range of motion.  Massive bilateral lower extremity edema with blistering and drainage. No focal areas of fluctuance or palpable abscess.  Neurological: He is alert and oriented to person, place, and time. No cranial nerve deficit or sensory deficit. He exhibits normal muscle tone. Coordination normal.  Skin: Skin is warm, dry and intact.  Moderate erythema on both lower legs.  Psychiatric: His behavior is normal.  He is anxious  Nursing note and vitals reviewed.   ED Course  Procedures (including critical care time)  Medications  oxyCODONE-acetaminophen (PERCOCET/ROXICET) 5-325 MG per tablet 1 tablet (1 tablet Oral Given 09/10/14 2114)    Patient Vitals for the past 24 hrs:  BP Temp Temp src Pulse Resp SpO2  09/10/14 2130 139/57 mmHg - - 90 21 98 %  09/10/14 2115 154/67 mmHg - - 99 18 99 %   09/10/14 2045 158/70 mmHg - - 92 23 99 %  09/10/14 2030 150/59 mmHg - - 93 23 98 %  09/10/14 1957 - 99.7 F (37.6 C) Rectal - - -  09/10/14 1841 163/65 mmHg 98.8 F (37.1 C) Oral 82 24 95 %    10:01 PM Reevaluation with update and discussion. After initial assessment and treatment, an updated evaluation reveals all extremities have decreased erythema, after lying supine with feet elevated. He has no further complaints. [place] Review Labs Reviewed  CBC WITH DIFFERENTIAL/PLATELET - Abnormal; Notable for the following:    WBC  20.3 (*)    RBC 3.61 (*)    Hemoglobin 9.2 (*)    HCT 29.5 (*)    MCH 25.5 (*)    RDW 19.8 (*)    Basophils Relative 12 (*)    Neutro Abs 10.4 (*)    Lymphs Abs 4.9 (*)    Monocytes Absolute 1.8 (*)    Eosinophils Absolute 0.8 (*)    Basophils Absolute 2.4 (*)    All other components within normal limits  BASIC METABOLIC PANEL - Abnormal; Notable for the following:    Glucose, Bld 106 (*)    BUN 42 (*)    Creatinine, Ser 1.43 (*)    GFR calc non Af Amer 47 (*)    GFR calc Af Amer 55 (*)    All other components within normal limits  I-STAT CHEM 8, ED - Abnormal; Notable for the following:    BUN 42 (*)    Creatinine, Ser 1.50 (*)    Glucose, Bld 103 (*)    Calcium, Ion 1.31 (*)    Hemoglobin 9.9 (*)    HCT 29.0 (*)    All other components within normal limits  URINE CULTURE  URINALYSIS, ROUTINE W REFLEX MICROSCOPIC  I-STAT CG4 LACTIC ACID, ED    Imaging Review No results found.   EKG Interpretation None      MDM   Final diagnoses:  Peripheral edema  Bilateral lower leg cellulitis    Nonspecific symptoms, without clear evidence for serious bacterial infection. Metabolic instability or suggestion for impending vascular collapse. He may have mild early cellulitis of his lower legs.    Nursing Notes Reviewed/ Care Coordinated Applicable Imaging Reviewed Interpretation of Laboratory Data incorporated into ED  treatment  The patient appears reasonably screened and/or stabilized for discharge and I doubt any other medical condition or other [facility] requiring further screening, evaluation, or treatment in the ED at this time prior to discharge.  Plan: Home Medications- Doxycycline; Home Treatments- Elevation, moist heat; return here if the recommended treatment, does not improve the symptoms; Recommended follow up- PCP 3 day check up   Daleen Bo, MD 09/10/14 2212

## 2014-09-10 NOTE — ED Notes (Signed)
Pt presents from [facility] via [facility] c/o BL leg pain and swelling x 6 hours.  Pt states he usually have some swelling but has become worse over the next 2-3 weeks.  Pt hx of cellulitis.  Pt legs edematous, weeping, and red.  BP-150/78 P-76 Reg, R-18, O2-97% RA, CBG-137, A x 4.  Pt c/o 8/10 BL leg pain.

## 2014-09-10 NOTE — ED Notes (Addendum)
Spoke with patient's daughter [name]- Emergency Contact for pt).  She states she is unable to come get pt because she drives a SUV and pt is unable to get in it.  She states pt should have $ for a cab.  Pt states he doesn't have $ for a cab because he had to give his son extra $ this week to help him out.  Pt states he has received cab vouchers from the ED "a couple times" in the past.  States that it has to be a cab driver that will walk with him from the taxi to the front door.  Pt states he is unable to walk up steps to get on bus and that his dr told him not to be walking up bus steps due to legs.  States he is also unable to walk from last bus stop at [employer] on [address] to [address] to his apartment.  Pt states he normally could call someone to come get him but because it is so late he is unable to get anyone to come.  Pt states he doesn't have anyone to call and really needs to be home.  Also reports son doesn't have a car to drive and that he is unable to get in daughter's SUV.  Cab voucher given to pt.

## 2014-09-10 NOTE — Discharge Instructions (Signed)
Elevate your feet above your heart, as much as possible. Use moist compresses on your lower legs to 3 times a day to improve circulation and improve healing. See your doctor, for a checkup, later this week.   Cellulitis Cellulitis is an infection of the skin and the tissue beneath it. The infected area is usually red and tender. Cellulitis occurs most often in the arms and lower legs.  CAUSES  Cellulitis is caused by bacteria that enter the skin through cracks or cuts in the skin. The most common types of bacteria that cause cellulitis are staphylococci and streptococci. SIGNS AND SYMPTOMS   Redness and warmth.  Swelling.  Tenderness or pain.  Fever. DIAGNOSIS  Your health care provider can usually determine what is wrong based on a physical exam. Blood tests may also be done. TREATMENT  Treatment usually involves taking an antibiotic medicine. HOME CARE INSTRUCTIONS   Take your antibiotic medicine as directed by your health care provider. Finish the antibiotic even if you start to feel better.  Keep the infected arm or leg elevated to reduce swelling.  Apply a warm cloth to the affected area up to 4 times per day to relieve pain.  Take medicines only as directed by your health care provider.  Keep all follow-up visits as directed by your health care provider. SEEK MEDICAL CARE IF:   You notice red streaks coming from the infected area.  Your red area gets larger or turns dark in color.  Your bone or joint underneath the infected area becomes painful after the skin has healed.  Your infection returns in the same area or another area.  You notice a swollen bump in the infected area.  You develop new symptoms.  You have a fever. SEEK IMMEDIATE MEDICAL CARE IF:   You feel very sleepy.  You develop vomiting or diarrhea.  You have a general ill feeling (malaise) with muscle aches and pains. MAKE SURE YOU:   Understand these instructions.  Will watch your  condition.  Will get help right away if you are not doing well or get worse. Document Released: 02/11/2005 Document Revised: 09/18/2013 Document Reviewed: 07/20/2011 [facility] Patient Information 2015 [place], [place]. This information is not intended to replace advice given to you by your health care provider. Make sure you discuss any questions you have with your health care provider.

## 2014-09-11 ENCOUNTER — Telehealth: Payer: Self-pay | Admitting: Internal Medicine

## 2014-09-11 DIAGNOSIS — I129 Hypertensive chronic kidney disease with stage 1 through stage 4 chronic kidney disease, or unspecified chronic kidney disease: Secondary | ICD-10-CM | POA: Diagnosis not present

## 2014-09-11 DIAGNOSIS — L89313 Pressure ulcer of right buttock, stage 3: Secondary | ICD-10-CM | POA: Diagnosis not present

## 2014-09-11 DIAGNOSIS — E1140 Type 2 diabetes mellitus with diabetic neuropathy, unspecified: Secondary | ICD-10-CM | POA: Diagnosis not present

## 2014-09-11 DIAGNOSIS — I5032 Chronic diastolic (congestive) heart failure: Secondary | ICD-10-CM | POA: Diagnosis not present

## 2014-09-11 DIAGNOSIS — I712 Thoracic aortic aneurysm, without rupture: Secondary | ICD-10-CM | POA: Diagnosis not present

## 2014-09-11 DIAGNOSIS — D471 Chronic myeloproliferative disease: Secondary | ICD-10-CM | POA: Diagnosis not present

## 2014-09-11 LAB — URINE CULTURE
Colony Count: 6000
SPECIAL REQUESTS: NORMAL

## 2014-09-11 LAB — PATHOLOGIST SMEAR REVIEW

## 2014-09-11 NOTE — Telephone Encounter (Signed)
LMOM informing Ronalee Belts to return my call. Jury letter printed and mailed to Pt.

## 2014-09-11 NOTE — Telephone Encounter (Signed)
1. Okay to extend the physical therapy 2. Send a letter To whom it may concern, [name] is a patient of mine, he is a 74 year old gentleman with multiple medical problems, essentially homebound from his medical problems and with no transportation. 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 Please don't hesitate to call the office for any questions regards this gentleman.

## 2014-09-11 NOTE — Telephone Encounter (Signed)
FYI. Please advise.

## 2014-09-11 NOTE — Telephone Encounter (Signed)
WENT TO THE ED LAST NIGHT LEGS ARE BLISTERED THEY PUT HIM ON DOXYCYCLINE 100 2 TIMES PER DAY  HIS THERAPY ORDER IS RUNNING OUT AND HE WOULD LIKE ANOTHER COUPLE OF WEEKS SINCE HE IS STRUGGLING HE GOT A [employer] FOR [employer] AND NEEDS A LETTER TO GET HIM EXCUSED

## 2014-09-13 ENCOUNTER — Other Ambulatory Visit: Payer: Self-pay | Admitting: *Deleted

## 2014-09-13 ENCOUNTER — Other Ambulatory Visit: Payer: Self-pay

## 2014-09-13 DIAGNOSIS — I714 Abdominal aortic aneurysm, without rupture, unspecified: Secondary | ICD-10-CM

## 2014-09-13 DIAGNOSIS — I712 Thoracic aortic aneurysm, without rupture: Secondary | ICD-10-CM | POA: Diagnosis not present

## 2014-09-13 DIAGNOSIS — D471 Chronic myeloproliferative disease: Secondary | ICD-10-CM | POA: Diagnosis not present

## 2014-09-13 DIAGNOSIS — I5032 Chronic diastolic (congestive) heart failure: Secondary | ICD-10-CM | POA: Diagnosis not present

## 2014-09-13 DIAGNOSIS — E1140 Type 2 diabetes mellitus with diabetic neuropathy, unspecified: Secondary | ICD-10-CM | POA: Diagnosis not present

## 2014-09-13 DIAGNOSIS — I129 Hypertensive chronic kidney disease with stage 1 through stage 4 chronic kidney disease, or unspecified chronic kidney disease: Secondary | ICD-10-CM | POA: Diagnosis not present

## 2014-09-13 DIAGNOSIS — L89313 Pressure ulcer of right buttock, stage 3: Secondary | ICD-10-CM | POA: Diagnosis not present

## 2014-09-13 NOTE — Telephone Encounter (Signed)
Ronalee Belts returned your call. Please call him back when you are available. Thank you.

## 2014-09-13 NOTE — Telephone Encounter (Signed)
Ronalee Belts returned your call. Please contact him again when you are available. Thank you. Phone # [phone]

## 2014-09-14 DIAGNOSIS — I129 Hypertensive chronic kidney disease with stage 1 through stage 4 chronic kidney disease, or unspecified chronic kidney disease: Secondary | ICD-10-CM | POA: Diagnosis not present

## 2014-09-14 DIAGNOSIS — L89313 Pressure ulcer of right buttock, stage 3: Secondary | ICD-10-CM | POA: Diagnosis not present

## 2014-09-14 DIAGNOSIS — I5032 Chronic diastolic (congestive) heart failure: Secondary | ICD-10-CM | POA: Diagnosis not present

## 2014-09-14 DIAGNOSIS — E1140 Type 2 diabetes mellitus with diabetic neuropathy, unspecified: Secondary | ICD-10-CM | POA: Diagnosis not present

## 2014-09-14 DIAGNOSIS — I712 Thoracic aortic aneurysm, without rupture: Secondary | ICD-10-CM | POA: Diagnosis not present

## 2014-09-14 DIAGNOSIS — D471 Chronic myeloproliferative disease: Secondary | ICD-10-CM | POA: Diagnosis not present

## 2014-09-14 NOTE — Telephone Encounter (Signed)
LMOM informing Ronalee Belts to return call.

## 2014-09-17 DIAGNOSIS — E1140 Type 2 diabetes mellitus with diabetic neuropathy, unspecified: Secondary | ICD-10-CM | POA: Diagnosis not present

## 2014-09-17 DIAGNOSIS — I712 Thoracic aortic aneurysm, without rupture: Secondary | ICD-10-CM | POA: Diagnosis not present

## 2014-09-17 DIAGNOSIS — I5032 Chronic diastolic (congestive) heart failure: Secondary | ICD-10-CM | POA: Diagnosis not present

## 2014-09-17 DIAGNOSIS — D471 Chronic myeloproliferative disease: Secondary | ICD-10-CM | POA: Diagnosis not present

## 2014-09-17 DIAGNOSIS — L89313 Pressure ulcer of right buttock, stage 3: Secondary | ICD-10-CM | POA: Diagnosis not present

## 2014-09-17 DIAGNOSIS — I129 Hypertensive chronic kidney disease with stage 1 through stage 4 chronic kidney disease, or unspecified chronic kidney disease: Secondary | ICD-10-CM | POA: Diagnosis not present

## 2014-09-18 ENCOUNTER — Telehealth: Payer: Self-pay | Admitting: *Deleted

## 2014-09-18 ENCOUNTER — Telehealth: Payer: Self-pay

## 2014-09-18 NOTE — Telephone Encounter (Signed)
Received home health certification and plan of care via mail from [facility]. Forwarded to Dr. Larose Kells for review/signature. JG//CMA

## 2014-09-18 NOTE — Telephone Encounter (Signed)
Agree, no longer taking

## 2014-09-18 NOTE — Telephone Encounter (Signed)
Received mail from [facility] regarding refill for AGRYLIN 0.5 CAPS (ANAGRELIDE). Medication no longer on medication list. Last filled on 02/13/2014 # 30 and 1 refill. Was d/c at discharge? Please advise.

## 2014-09-18 NOTE — Telephone Encounter (Signed)
Noted  

## 2014-09-20 DIAGNOSIS — E1140 Type 2 diabetes mellitus with diabetic neuropathy, unspecified: Secondary | ICD-10-CM | POA: Diagnosis not present

## 2014-09-20 DIAGNOSIS — I5032 Chronic diastolic (congestive) heart failure: Secondary | ICD-10-CM | POA: Diagnosis not present

## 2014-09-20 DIAGNOSIS — I712 Thoracic aortic aneurysm, without rupture: Secondary | ICD-10-CM

## 2014-09-20 DIAGNOSIS — D471 Chronic myeloproliferative disease: Secondary | ICD-10-CM | POA: Diagnosis not present

## 2014-09-20 DIAGNOSIS — L89313 Pressure ulcer of right buttock, stage 3: Secondary | ICD-10-CM | POA: Diagnosis not present

## 2014-09-21 ENCOUNTER — Telehealth: Payer: Self-pay | Admitting: Internal Medicine

## 2014-09-21 DIAGNOSIS — I712 Thoracic aortic aneurysm, without rupture: Secondary | ICD-10-CM | POA: Diagnosis not present

## 2014-09-21 DIAGNOSIS — I5032 Chronic diastolic (congestive) heart failure: Secondary | ICD-10-CM | POA: Diagnosis not present

## 2014-09-21 DIAGNOSIS — E1140 Type 2 diabetes mellitus with diabetic neuropathy, unspecified: Secondary | ICD-10-CM | POA: Diagnosis not present

## 2014-09-21 DIAGNOSIS — I129 Hypertensive chronic kidney disease with stage 1 through stage 4 chronic kidney disease, or unspecified chronic kidney disease: Secondary | ICD-10-CM | POA: Diagnosis not present

## 2014-09-21 DIAGNOSIS — L89313 Pressure ulcer of right buttock, stage 3: Secondary | ICD-10-CM | POA: Diagnosis not present

## 2014-09-21 DIAGNOSIS — D471 Chronic myeloproliferative disease: Secondary | ICD-10-CM | POA: Diagnosis not present

## 2014-09-21 NOTE — Telephone Encounter (Signed)
If he is afebrile and the legs look better, the main thing we need to do is to  treat the swelling with leg elevation and diuretics. If he has fever, chills, new blisters, discharge: Needs to be seen.

## 2014-09-21 NOTE — Telephone Encounter (Signed)
Caller name:Mike from [place] Relation to pt: Call back number: [phone] Pharmacy:  Reason for call:   States that patient legs are improving but still has blisters.

## 2014-09-21 NOTE — Telephone Encounter (Signed)
Signed forms mailed to [facility]. Copy sent for scanning. JG//CMA

## 2014-09-21 NOTE — Telephone Encounter (Signed)
[place] of Dr. Larose Kells recommendations. Informed him to call if he has any further questions.

## 2014-09-21 NOTE — Telephone Encounter (Signed)
Please advise 

## 2014-09-25 DIAGNOSIS — L89313 Pressure ulcer of right buttock, stage 3: Secondary | ICD-10-CM | POA: Diagnosis not present

## 2014-09-25 DIAGNOSIS — E1140 Type 2 diabetes mellitus with diabetic neuropathy, unspecified: Secondary | ICD-10-CM | POA: Diagnosis not present

## 2014-09-25 DIAGNOSIS — I712 Thoracic aortic aneurysm, without rupture: Secondary | ICD-10-CM | POA: Diagnosis not present

## 2014-09-25 DIAGNOSIS — I5032 Chronic diastolic (congestive) heart failure: Secondary | ICD-10-CM | POA: Diagnosis not present

## 2014-09-25 DIAGNOSIS — D471 Chronic myeloproliferative disease: Secondary | ICD-10-CM | POA: Diagnosis not present

## 2014-09-25 DIAGNOSIS — I129 Hypertensive chronic kidney disease with stage 1 through stage 4 chronic kidney disease, or unspecified chronic kidney disease: Secondary | ICD-10-CM | POA: Diagnosis not present

## 2014-09-26 ENCOUNTER — Other Ambulatory Visit: Payer: Self-pay

## 2014-09-28 DIAGNOSIS — I5032 Chronic diastolic (congestive) heart failure: Secondary | ICD-10-CM | POA: Diagnosis not present

## 2014-09-28 DIAGNOSIS — E1140 Type 2 diabetes mellitus with diabetic neuropathy, unspecified: Secondary | ICD-10-CM | POA: Diagnosis not present

## 2014-09-28 DIAGNOSIS — L89313 Pressure ulcer of right buttock, stage 3: Secondary | ICD-10-CM | POA: Diagnosis not present

## 2014-09-28 DIAGNOSIS — I129 Hypertensive chronic kidney disease with stage 1 through stage 4 chronic kidney disease, or unspecified chronic kidney disease: Secondary | ICD-10-CM | POA: Diagnosis not present

## 2014-09-28 DIAGNOSIS — D471 Chronic myeloproliferative disease: Secondary | ICD-10-CM | POA: Diagnosis not present

## 2014-09-28 DIAGNOSIS — I712 Thoracic aortic aneurysm, without rupture: Secondary | ICD-10-CM | POA: Diagnosis not present

## 2014-10-01 ENCOUNTER — Other Ambulatory Visit: Payer: Medicare Other

## 2014-10-01 ENCOUNTER — Encounter: Payer: Self-pay | Admitting: Hematology & Oncology

## 2014-10-02 ENCOUNTER — Inpatient Hospital Stay: Admission: RE | Admit: 2014-10-02 | Payer: Self-pay | Source: Ambulatory Visit

## 2014-10-02 ENCOUNTER — Ambulatory Visit: Payer: Medicare Other | Admitting: Thoracic Surgery (Cardiothoracic Vascular Surgery)

## 2014-10-03 ENCOUNTER — Telehealth: Payer: Self-pay | Admitting: *Deleted

## 2014-10-09 ENCOUNTER — Ambulatory Visit (INDEPENDENT_AMBULATORY_CARE_PROVIDER_SITE_OTHER): Payer: Medicare Other | Admitting: Internal Medicine

## 2014-10-09 ENCOUNTER — Encounter: Payer: Self-pay | Admitting: Internal Medicine

## 2014-10-09 VITALS — BP 130/76 | HR 80 | Temp 98.0°F | Ht 72.0 in | Wt 220.1 lb

## 2014-10-09 DIAGNOSIS — R609 Edema, unspecified: Secondary | ICD-10-CM | POA: Diagnosis not present

## 2014-10-09 DIAGNOSIS — I712 Thoracic aortic aneurysm, without rupture, unspecified: Secondary | ICD-10-CM

## 2014-10-09 DIAGNOSIS — D471 Chronic myeloproliferative disease: Secondary | ICD-10-CM

## 2014-10-09 DIAGNOSIS — G629 Polyneuropathy, unspecified: Secondary | ICD-10-CM

## 2014-10-09 DIAGNOSIS — Z23 Encounter for immunization: Secondary | ICD-10-CM | POA: Diagnosis not present

## 2014-10-09 DIAGNOSIS — I509 Heart failure, unspecified: Secondary | ICD-10-CM | POA: Diagnosis not present

## 2014-10-09 DIAGNOSIS — R7303 Prediabetes: Secondary | ICD-10-CM

## 2014-10-09 DIAGNOSIS — I5032 Chronic diastolic (congestive) heart failure: Secondary | ICD-10-CM

## 2014-10-09 LAB — BASIC METABOLIC PANEL
BUN: 42 mg/dL — ABNORMAL HIGH (ref 6–23)
CALCIUM: 10.3 mg/dL (ref 8.4–10.5)
CO2: 28 mEq/L (ref 19–32)
Chloride: 102 mEq/L (ref 96–112)
Creatinine, Ser: 1.54 mg/dL — ABNORMAL HIGH (ref 0.40–1.50)
GFR: 57.25 mL/min — ABNORMAL LOW (ref >60.00)
GLUCOSE: 100 mg/dL — AB (ref 70–99)
POTASSIUM: 4.1 meq/L (ref 3.5–5.1)
SODIUM: 136 meq/L (ref 135–145)

## 2014-10-09 MED ORDER — METOPROLOL TARTRATE 25 MG PO TABS: 25.0000 mg | ORAL_TABLET | Freq: Two times a day (BID) | ORAL | Status: DC

## 2014-10-09 MED ORDER — SERTRALINE HCL 50 MG PO TABS: 50.0000 mg | ORAL_TABLET | Freq: Every day | ORAL | Status: DC

## 2014-10-09 NOTE — Assessment & Plan Note (Signed)
Diagnosed with neuropathy elsewhere , currently on gabapentin, occasionally requires hydrocodone for pain

## 2014-10-09 NOTE — Progress Notes (Signed)
Subjective:    Patient ID: Juan [name], male    DOB: 1941/04/22, 74 y.o.   MRN: [id number]  DOS:  10/09/2014 Type of visit - description : rov Interval history: [name] to the emergency room 09/10/2014, he was diagnosed with early cellulitis, prescribed doxycycline. He has stasis dermatitis, still has pain on and off at the pretibial area. Had diarrhea last week, symptoms resolved. History of aortic aneurysm, chart reviewed. Medications reviewed, reports good compliance. Labs reviewed, last creatinine slightly elevated, recheck   Review of Systems Denies fever chills No chest pain, shortness of breath at baseline, he continue with feeling tired/fatigued. At baseline No nausea vomiting Denies depression or anxiety per se.   Past Medical History  Diagnosis Date  . CAD (coronary artery disease)     dx elsewheer in past, no documentation. Non-ischemic myovue 2007  . CHF (congestive heart failure)     dx elsewhere, no documentation. Normal EF by previous echo. Trival AS needs f/u ECHO by 2012  . Hypertension   . Sleep apnea, obstructive     at some point used CPAP, was d/c  years ago  . Anemia   . History of thrombocytosis   . Allergic rhinitis   . Edema     R>L leg, u/s 5-12 neg for DVT  . Hemorrhoid   . Type II diabetes mellitus   . Sinus congestion   . Migraine     "once/wk at least" (07/11/2013)  . Shortness of breath   . Myeloproliferative disease   . Ascending aortic aneurysm 03/2014    4.3cm on CT scan    Past Surgical History  Procedure Laterality Date  . Toe surgery Right     "tried to straighten out big toe" (07/11/2013)    History   Social History  . Marital Status: Widowed    Spouse Name: N/A  . Number of Children: 2  . Years of Education: N/A   Occupational History  . retired    .     Social History Main Topics  . Smoking status: Former Smoker -- 0.25 packs/day for 12 years    Types: Cigarettes    Quit date: 05/18/1966  . Smokeless tobacco: Never  Used     Comment: quit smoking 45 years ago  . Alcohol Use: No  . Drug Use: No  . Sexual Activity: Yes   Other Topics Concern  . Not on file   Social History Narrative   Moved from [place] 2006   Widow 2007   Son lives with him         Medication List       This list is accurate as of: 10/09/14 11:59 PM.  Always use your most recent med list.               acetaminophen 500 MG tablet  Commonly known as:  TYLENOL  Take 500-1,000 mg by mouth every 6 (six) hours as needed for moderate pain or headache.     aspirin 325 MG tablet  Take 1 tablet (325 mg total) by mouth daily.     b complex vitamins capsule  Take 1 capsule by mouth daily.     doxycycline 100 MG capsule  Commonly known as:  VIBRAMYCIN  Take 1 capsule (100 mg total) by mouth 2 (two) times daily.     feeding supplement (GLUCERNA SHAKE) Liqd  Take 237 mLs by mouth 2 (two) times daily between meals.     fluticasone 50 MCG/ACT nasal spray  Commonly known as:  FLONASE  Place 1 spray into both nostrils daily.     folic acid 1 MG tablet  Commonly known as:  FOLVITE  Take 1 mg by mouth daily.     furosemide 40 MG tablet  Commonly known as:  LASIX  Take 1 tablet (40 mg total) by mouth 2 (two) times daily.     gabapentin 100 MG capsule  Commonly known as:  NEURONTIN  Take 1 capsule (100 mg total) by mouth 3 (three) times daily.     hydrocerin Crea  Apply Eucerin cream to BLE Q day after bathing and roughly towel drying to remove loose skin     HYDROcodone-acetaminophen 5-325 MG per tablet  Commonly known as:  NORCO/VICODIN  Take 1 tablet by mouth every 6 (six) hours as needed for moderate pain.     LORazepam 0.5 MG tablet  Commonly known as:  ATIVAN  Take 0.5 mg by mouth every 6 (six) hours as needed for anxiety.     metoprolol tartrate 25 MG tablet  Commonly known as:  LOPRESSOR  Take 1 tablet (25 mg total) by mouth 2 (two) times daily.     senna-docusate 8.6-50 MG per tablet  Commonly known as:   Senokot-S  Take 1 tablet by mouth at bedtime.     sertraline 50 MG tablet  Commonly known as:  ZOLOFT  Take 1 tablet (50 mg total) by mouth daily.     sodium chloride 0.65 % Soln nasal spray  Commonly known as:  OCEAN  Place 1-2 sprays into both nostrils every 4 (four) hours as needed for congestion.           Objective:   Physical Exam BP 130/76 mmHg  Pulse 80  Temp(Src) 98 F (36.7 C) (Oral)  Ht 6' (1.829 m)  Wt 220 lb 2 oz (99.848 kg)  BMI 29.85 kg/m2  SpO2 99% General:   Well developed, well nourished . NAD.  HEENT:  Normocephalic . Face symmetric, atraumatic Lungs:  CTA B Normal respiratory effort, no intercostal retractions, no accessory muscle use. Heart: RRR, ? soft syst  murmur.  No pretibial edema bilaterally  Skin: Skin from the knees down bilaterally is hyperpigmented, thick, slightly hard. Not warm, not really erythematous. Slightly TTP Psych--  Behavior at baseline . No anxious or depressed appearing.        Assessment & Plan:

## 2014-10-09 NOTE — Assessment & Plan Note (Addendum)
Seems to be doing very well, no evidence of active cellulitis at this point; I think this is the best his legs will ever be. No change

## 2014-10-09 NOTE — Patient Instructions (Signed)
Get your blood work before you leave   Come back to the office in 4 months   for a routine check up

## 2014-10-09 NOTE — Assessment & Plan Note (Addendum)
CT 03-2015 show a ascending thoracic aneurysm. See previous entry, Dr. [name] discuss with surgery and they recommended a follow-up in 6 months. Plan: MRA to assess the aortic aneurysm

## 2014-10-09 NOTE — Progress Notes (Signed)
Pre visit review using our clinic review tool, if applicable. No additional management support is needed unless otherwise documented below in the visit note. 

## 2014-10-09 NOTE — Assessment & Plan Note (Signed)
A1c is stable over time, on no medications.

## 2014-10-09 NOTE — Assessment & Plan Note (Signed)
History of CHF, weight stable at 220, no evidence of vol overload. Check a BMP

## 2014-10-12 DIAGNOSIS — D471 Chronic myeloproliferative disease: Secondary | ICD-10-CM | POA: Diagnosis not present

## 2014-10-12 DIAGNOSIS — I5032 Chronic diastolic (congestive) heart failure: Secondary | ICD-10-CM | POA: Diagnosis not present

## 2014-10-12 DIAGNOSIS — I712 Thoracic aortic aneurysm, without rupture: Secondary | ICD-10-CM | POA: Diagnosis not present

## 2014-10-12 DIAGNOSIS — I129 Hypertensive chronic kidney disease with stage 1 through stage 4 chronic kidney disease, or unspecified chronic kidney disease: Secondary | ICD-10-CM | POA: Diagnosis not present

## 2014-10-12 DIAGNOSIS — E1140 Type 2 diabetes mellitus with diabetic neuropathy, unspecified: Secondary | ICD-10-CM | POA: Diagnosis not present

## 2014-10-12 DIAGNOSIS — L89313 Pressure ulcer of right buttock, stage 3: Secondary | ICD-10-CM | POA: Diagnosis not present

## 2014-10-17 ENCOUNTER — Other Ambulatory Visit: Payer: Self-pay | Admitting: Internal Medicine

## 2014-10-18 ENCOUNTER — Ambulatory Visit: Payer: Self-pay | Admitting: Physician Assistant

## 2014-10-18 ENCOUNTER — Telehealth: Payer: Self-pay | Admitting: Internal Medicine

## 2014-10-18 MED ORDER — HYDROCODONE-ACETAMINOPHEN 5-325 MG PO TABS: 1.0000 | ORAL_TABLET | Freq: Four times a day (QID) | ORAL | Status: DC | PRN

## 2014-10-18 NOTE — Telephone Encounter (Signed)
Pt is requesting refill on Hydrocodone.  Last OV: 10/09/2014 Last Fill: 09/07/2014 #30 0RF  Please advise.

## 2014-10-18 NOTE — Telephone Encounter (Signed)
Pt med list reports two different instructions on how to take his Lasix. Please advise if Pt should be taking 1 tablet daily or 1 tablet bid.

## 2014-10-18 NOTE — Telephone Encounter (Signed)
Per my last note Lasix 40 mg one tablet twice a day

## 2014-10-18 NOTE — Telephone Encounter (Addendum)
Pt does not have voicemail could not advise

## 2014-10-18 NOTE — Telephone Encounter (Signed)
Rx printed, awaiting MD signature.  

## 2014-10-18 NOTE — Telephone Encounter (Signed)
Okay #30 

## 2014-10-18 NOTE — Telephone Encounter (Signed)
Relation to pt: self Call back number: [phone]   Reason for call:  Pt requesting a refill of "pain medication" pt does not know the name. Pt would like to pick up RX Tuesday at [place].

## 2014-10-18 NOTE — Telephone Encounter (Signed)
Please inform Pt that Rx for Hydrocodone has been placed at front desk for pick-up at his convenience. Thank you.

## 2014-10-18 NOTE — Telephone Encounter (Signed)
Noted, Rx sent to pharmacy. 

## 2014-10-21 ENCOUNTER — Other Ambulatory Visit: Payer: Self-pay | Admitting: Internal Medicine

## 2014-10-22 ENCOUNTER — Encounter: Payer: Self-pay | Admitting: Hematology & Oncology

## 2014-10-22 ENCOUNTER — Telehealth: Payer: Self-pay | Admitting: Internal Medicine

## 2014-10-22 ENCOUNTER — Other Ambulatory Visit (HOSPITAL_BASED_OUTPATIENT_CLINIC_OR_DEPARTMENT_OTHER): Payer: Medicare Other

## 2014-10-22 ENCOUNTER — Ambulatory Visit (HOSPITAL_BASED_OUTPATIENT_CLINIC_OR_DEPARTMENT_OTHER): Payer: Medicare Other | Admitting: Hematology & Oncology

## 2014-10-22 VITALS — BP 139/62 | HR 64 | Temp 97.3°F | Resp 21 | Ht 72.0 in | Wt 226.0 lb

## 2014-10-22 DIAGNOSIS — D47Z9 Other specified neoplasms of uncertain behavior of lymphoid, hematopoietic and related tissue: Secondary | ICD-10-CM

## 2014-10-22 DIAGNOSIS — D471 Chronic myeloproliferative disease: Secondary | ICD-10-CM

## 2014-10-22 DIAGNOSIS — C9440 Acute panmyelosis with myelofibrosis not having achieved remission: Secondary | ICD-10-CM | POA: Diagnosis not present

## 2014-10-22 DIAGNOSIS — I10 Essential (primary) hypertension: Secondary | ICD-10-CM

## 2014-10-22 LAB — MANUAL DIFFERENTIAL (CHCC SATELLITE)
ALC: 5.5 10*3/uL — AB (ref 0.9–3.3)
ANC (CHCC MAN DIFF): 10.7 10*3/uL — AB (ref 1.5–6.5)
BAND NEUTROPHILS: 11 % — AB (ref 0–10)
BASO: 4 % — AB (ref 0–2)
Blasts: 4 % — ABNORMAL HIGH (ref 0–0)
Eos: 10 % — ABNORMAL HIGH (ref 0–7)
LYMPH: 26 % (ref 14–48)
MONO: 5 % (ref 0–13)
MYELOCYTES: 7 % — AB (ref 0–0)
Metamyelocytes: 1 % — ABNORMAL HIGH (ref 0–0)
PLT EST ~~LOC~~: ADEQUATE
SEG: 32 % — ABNORMAL LOW (ref 40–75)

## 2014-10-22 LAB — COMPREHENSIVE METABOLIC PANEL
ALK PHOS: 97 U/L (ref 39–117)
ALT: 13 U/L (ref 0–53)
AST: 30 U/L (ref 0–37)
Albumin: 3.9 g/dL (ref 3.5–5.2)
BUN: 45 mg/dL — AB (ref 6–23)
CO2: 26 mEq/L (ref 19–32)
Calcium: 9.9 mg/dL (ref 8.4–10.5)
Chloride: 105 mEq/L (ref 96–112)
Creatinine, Ser: 1.4 mg/dL — ABNORMAL HIGH (ref 0.50–1.35)
Glucose, Bld: 105 mg/dL — ABNORMAL HIGH (ref 70–99)
POTASSIUM: 4.6 meq/L (ref 3.5–5.3)
Sodium: 137 mEq/L (ref 135–145)
Total Bilirubin: 0.3 mg/dL (ref 0.2–1.2)
Total Protein: 6.6 g/dL (ref 6.0–8.3)

## 2014-10-22 LAB — CHCC SATELLITE - SMEAR

## 2014-10-22 LAB — CBC WITH DIFFERENTIAL (CANCER CENTER ONLY)
HCT: 29.7 % — ABNORMAL LOW (ref 38.7–49.9)
HGB: 9.4 g/dL — ABNORMAL LOW (ref 13.0–17.1)
MCH: 26.3 pg — ABNORMAL LOW (ref 28.0–33.4)
MCHC: 31.6 g/dL — AB (ref 32.0–35.9)
MCV: 83 fL (ref 82–98)
Platelets: 290 10*3/uL (ref 145–400)
RBC: 3.57 10*6/uL — AB (ref 4.20–5.70)
RDW: 20 % — AB (ref 11.1–15.7)
WBC: 21 10*3/uL — ABNORMAL HIGH (ref 4.0–10.0)

## 2014-10-22 NOTE — Telephone Encounter (Signed)
Caller name: Myles Tavella Relation to pt: Self Call back number: [phone] Pharmacy:  Reason for call: Pt came in office to pick up his rx for hydrocodone and asked also for his rx for potassium CL 20MEQ ER Tablets. Pt is wanting to have it printed out so he can take it with him today. Please advise.

## 2014-10-22 NOTE — Telephone Encounter (Signed)
Pt should not be taking Potassium, medication was d/c in 06/2014 when he was in [facility] due to critical potassium levels.

## 2014-10-22 NOTE — Progress Notes (Signed)
Hematology and Oncology Follow Up Visit  Juan [name] [id number] 01-17-1941 74 y.o. 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 is back for his follow-up. He actually looks better. He has a cane. He is walking.  He's not complaining of any pain. He's had no bleeding. His appetite is doing okay. He's had no problems with bowels or bladder. He's had no fever. He's had no cough. There's been no shortness of breath.  I think that he is home now. He does not come with anybody. He has nothing from any nursing home.  Again, he says that he does not want any workup done. Does not want any treatment to be given to him. He just wants to enjoy his life. I totally understand this.  Overall, his performance status is ECOG 3.  dications:  Current outpatient prescriptions:  .  acetaminophen (TYLENOL) 500 MG tablet, Take 500-1,000 mg by mouth every 6 (six) hours as needed for moderate pain or headache. , Disp: , Rfl:  .  aspirin 325 MG tablet, Take 1 tablet (325 mg total) by mouth daily., Disp: , Rfl:  .  b complex vitamins capsule, Take 1 capsule by mouth daily., Disp: 30 capsule, Rfl: 3 .  doxycycline (VIBRAMYCIN) 100 MG capsule, Take 1 capsule (100 mg total) by mouth 2 (two) times daily. (Patient not taking: Reported on 10/09/2014), Disp: 20 capsule, Rfl: 0 .  feeding supplement, GLUCERNA SHAKE, (GLUCERNA SHAKE) LIQD, Take 237 mLs by mouth 2 (two) times daily between meals., Disp: , Rfl: 0 .  fluticasone (FLONASE) 50 MCG/ACT nasal spray, Place 1 spray into both nostrils daily. , Disp: , Rfl:  .  folic acid (FOLVITE) 1 MG tablet, Take 1 mg by mouth daily. , Disp: , Rfl:  .  furosemide (LASIX) 40 MG tablet, Take 1 tablet (40 mg total) by mouth 2 (two) times daily., Disp: , Rfl:  .  furosemide (LASIX) 40 MG tablet, Take 1 tablet (40 mg total) by mouth 2 (two) times daily.,  Disp: 180 tablet, Rfl: 1 .  gabapentin (NEURONTIN) 100 MG capsule, Take 1 capsule (100 mg total) by mouth 3 (three) times daily., Disp: , Rfl:  .  hydrocerin (EUCERIN) CREA, Apply Eucerin cream to BLE Q day after bathing and roughly towel drying to remove loose skin, Disp: 228 g, Rfl: 0 .  HYDROcodone-acetaminophen (NORCO/VICODIN) 5-325 MG per tablet, Take 1 tablet by mouth every 6 (six) hours as needed for moderate pain., Disp: 30 tablet, Rfl: 0 .  LORazepam (ATIVAN) 0.5 MG tablet, Take 0.5 mg by mouth every 6 (six) hours as needed for anxiety., Disp: , Rfl:  .  metoprolol tartrate (LOPRESSOR) 25 MG tablet, Take 1 tablet (25 mg total) by mouth 2 (two) times daily., Disp: 60 tablet, Rfl: 6 .  senna-docusate (SENOKOT-S) 8.6-50 MG per tablet, Take 1 tablet by mouth at bedtime., Disp: , Rfl:  .  sertraline (ZOLOFT) 50 MG tablet, Take 1 tablet (50 mg total) by mouth daily., Disp: 30 tablet, Rfl: 6 .  sodium chloride (OCEAN) 0.65 % SOLN nasal spray, Place 1-2 sprays into both nostrils every 4 (four) hours as needed for congestion. (Patient not taking: Reported on 08/21/2014), Disp: , Rfl: 0  Allergies: No Known Allergies  Past Medical History, Surgical history, Social history, and Family History were reviewed and updated.  Review of Systems: As above  Physical  Exam:  height is 6' (1.829 m) and weight is 226 lb (102.513 kg). His oral temperature is 97.3 F (36.3 C). His blood pressure is 139/62 and his pulse is 64. His respiration is 21.   Chronically ill-appearing African-American gentleman. Head and neck exam shows no ocular or oral lesions. He has no palpable cervical or supraclavicular lymph nodes. Lungs are clear bilaterally. Cardiac exam regular rate and rhythm with no murmurs, rubs or bruits. Abdomen is soft. His spleen tip might be just below the left costal margin. There is no hepatomegaly. He has no fluid wave. Extremities shows some chronic 1+ edema in his lower extremities. Skin exam is  somewhat dry. Neurological exam is nonfocal.  Lab Results  Component Value Date   WBC 21.0* 10/22/2014   HGB 9.4* 10/22/2014   HCT 29.7* 10/22/2014   MCV 83 10/22/2014   PLT 290 10/22/2014     Chemistry      Component Value Date/Time   NA 137 10/22/2014 1008   NA 138 07/31/2014   NA 143 07/30/2014 1202   NA 138 11/16/2013 0847   K 4.6 10/22/2014 1008   K 5.1* 07/30/2014 1202   K 4.5 11/16/2013 0847   CL 105 10/22/2014 1008   CL 105 07/30/2014 1202   CO2 26 10/22/2014 1008   CO2 28 07/30/2014 1202   CO2 22 11/16/2013 0847   BUN 45* 10/22/2014 1008   BUN 23* 07/31/2014   BUN 21 07/30/2014 1202   BUN 20.2 11/16/2013 0847   CREATININE 1.40* 10/22/2014 1008   CREATININE 1.2 07/31/2014   CREATININE 1.3* 07/30/2014 1202   CREATININE 1.2 11/16/2013 0847   GLU 110 07/31/2014      Component Value Date/Time   CALCIUM 9.9 10/22/2014 1008   CALCIUM 9.6 07/30/2014 1202   CALCIUM 9.8 11/16/2013 0847   ALKPHOS 97 10/22/2014 1008   ALKPHOS 88* 07/30/2014 1202   ALKPHOS 125 11/16/2013 0847   AST 30 10/22/2014 1008   AST 30 07/30/2014 1202   AST 25 11/16/2013 0847   ALT 13 10/22/2014 1008   ALT 10 07/30/2014 1202   ALT 11 11/16/2013 0847   BILITOT 0.3 10/22/2014 1008   BILITOT 0.50 07/30/2014 1202   BILITOT 0.35 11/16/2013 0847     Blood smear shows increased white blood cells. He has a few myeloblasts. He has immature myeloid cells. He has nucleated red cells. Platelets are well granulated. He has no schistocytes. He has some teardrop red cells. There are no hypersegmented polys.    Impression and Plan: Juan [name] is 74 year old African-American gentleman. He has a known myeloproliferative disorder. He's been tested for BCR/ABL and JAK2 in the past. He's been negative.  Again, he has refused any type of workup.  By his peripheral blood smear, he is probably transforming over into an acute myeloid process. He does have some myeloblasts on the smear. Again, we will not know if  he truly is leukemic as he will not allow [place] to do any other studies.  His performance status is not that great so I can somehow understand not being aggressive here and being able to intervene.  I am glad that his blood counts are holding pretty steady. This hopefully is a good sign.  Again, there is nothing that we have to do right now. He seems to be doing pretty well at the nursing home. He says that he may be going home soon.  I want to see him back in about 2 months now.  Over, he will continue do do pretty well. Volanda Napoleon, MD 6/6/20166:57 PM

## 2014-10-22 NOTE — Telephone Encounter (Signed)
Does not need to take potassium ( in fact his potassium today is normal despite not taking supplements)

## 2014-10-22 NOTE — Telephone Encounter (Signed)
Pt came into office today, demanding a refill on Potassium CL 20 mEq ER tabs. Pt was informed that he was not supposed to be taking Potassium as it was d/c in 06/2014 for critical potassium levels. Pt stated he didn't understand he was here last week and nothing was mentioned about him stopping the Potassium and that the hospital instructed him to continue taking the potassium. However, when medication reconcilation was done by me and Dr. Larose Kells at visit, Pt never mentioned he was still taking Potassium. Informed Pt that I could not refill, that he would need appt to speak with Dr. Larose Kells about it. Pt informed that he did not have the money for another visit. Informed Pt that I would send a message to Dr. Larose Kells regarding this but would not be able to refill until I speak with him.   Dr. Larose Kells please advise if Pt should continue taking potassium or if he is supposed to d/c. Thank you.

## 2014-10-22 NOTE — Telephone Encounter (Signed)
Spoke with Pt, he informed me that he has indeed been taking his Potassium. He is currently out. Informed him Dr. Larose Kells recommends him to come back in 2 weeks just to recheck his potassium off the medication. Pt informed that he would not be able to come in because he is low income and does not have the money for a taxi. I then informed him I would let Dr. Larose Kells know and see what else will need to be done. Pt then informed me that the hospital in [place] (???) told him that he needed to take Potassium, I informed him I understood but can only do as Dr. Larose Kells recommends or states. Pt also questioned about his Agrelin medication that he received at the hospital that we wouldn't refill as well. I informed him that he was supposed to d/c Agrelin at discharge from the hospital. Pt informed he was never told that. Pt then mentioned that he didn't think he had to have an appt to come to the lab, I informed him at the [place] office he does not but at our [facility]. Pt informed that he would speak with his grandson and see when he can bring him in for labs. Pt will call back to schedule appt.

## 2014-10-23 NOTE — Addendum Note (Signed)
Addended by: Wilfrid Lund on: 10/23/2014 08:16 AM   Modules accepted: Orders

## 2014-10-23 NOTE — Telephone Encounter (Signed)
BMP future ordered.

## 2014-11-05 ENCOUNTER — Other Ambulatory Visit (INDEPENDENT_AMBULATORY_CARE_PROVIDER_SITE_OTHER): Payer: Medicare Other

## 2014-11-05 DIAGNOSIS — I10 Essential (primary) hypertension: Secondary | ICD-10-CM | POA: Diagnosis not present

## 2014-11-05 LAB — BASIC METABOLIC PANEL
BUN: 27 mg/dL — ABNORMAL HIGH (ref 6–23)
CHLORIDE: 105 meq/L (ref 96–112)
CO2: 29 mEq/L (ref 19–32)
Calcium: 9.7 mg/dL (ref 8.4–10.5)
Creatinine, Ser: 1.25 mg/dL (ref 0.40–1.50)
GFR: 72.82 mL/min (ref >60.00)
Glucose, Bld: 94 mg/dL (ref 70–99)
POTASSIUM: 3.5 meq/L (ref 3.5–5.1)
Sodium: 139 mEq/L (ref 135–145)

## 2014-11-06 ENCOUNTER — Encounter: Payer: Self-pay | Admitting: Cardiovascular Disease

## 2014-11-06 ENCOUNTER — Telehealth: Payer: Self-pay | Admitting: Cardiovascular Disease

## 2014-11-06 NOTE — Telephone Encounter (Signed)
ERROR

## 2014-11-29 ENCOUNTER — Other Ambulatory Visit: Payer: Self-pay

## 2015-01-03 ENCOUNTER — Ambulatory Visit: Payer: Self-pay | Admitting: Cardiovascular Disease

## 2015-01-09 ENCOUNTER — Other Ambulatory Visit: Payer: Self-pay

## 2015-01-10 ENCOUNTER — Ambulatory Visit: Payer: Medicare Other | Admitting: Internal Medicine

## 2015-01-14 NOTE — Progress Notes (Signed)
Patient ID: Juan [name], male   DOB: 1940/11/09, 74 y.o.   MRN: [id number] Juan [name] is seen today for F/U of HTN, and edema. He carries a Dx of CAD and CHF. There has never been any documentation of this. He indicated these problems when he moved here from [place]> He had a normal myovue and echo in 2007. He is a poor historian but denies PND, orthopnea and his edema is improved. He has mild chronic fatigue and exertional dyspnea. He denies cough or sputum. He has been compliant with his meds. His son lives with him and has some psycholigical issues. This is a little stressful for him    ECG reviewed today NSR 70 Normal  Compliant with meds no new heart issues Has anemia being seen by Dr Larose Kells  Echo 1/15 Study Conclusions  Normal EF   - Left ventricle: The cavity size was normal. There was mild focal basal and mild concentric hypertrophy of the septum. Systolic function was normal. The estimated ejection fraction was in the range of 55% to 60%. Wall motion was normal; there were no regional wall motion abnormalities. Doppler parameters are consistent with abnormal left ventricular relaxation (grade 1 diastolic dysfunction). Doppler parameters are consistent with mildly elevated ventricular filling pressure. - Mitral valve: Calcified annulus. Mildly thickened leaflets . - Left atrium: The atrium was moderately dilated. - Right ventricle: The cavity size was mildly dilated. Wall thickness was normal. Systolic pressure was increased. - Right atrium: The atrium was mildly dilated. - Atrial septum: No defect or patent foramen ovale was identified. - Pulmonary arteries: PA peak pressure: 49m Hg (S). - Pericardium, extracardiac: A trivial pericardial effusion was identified.   12/09/13  LE venous duplex no thrombosis   ROS: Denies fever, malais, weight loss, blurry vision, decreased visual acuity, cough, sputum, SOB, hemoptysis, pleuritic pain, palpitaitons, heartburn, abdominal pain, melena, lower  extremity edema, claudication, or rash.  All other systems reviewed and negative  General: Affect appropriate Obese black male  HEENT: normal Neck supple with no adenopathy JVP normal no bruits no thyromegaly Lungs clear with no wheezing and good diaphragmatic motion Heart:  S1/S2 no murmur, no rub, gallop or click PMI normal Abdomen: benighn, BS positve, no tenderness, no AAA no bruit.  No HSM or HJR Distal pulses intact with no bruits Plus 2 bilateral  edema Neuro non-focal Skin warm and dry No muscular weakness   Current Outpatient Prescriptions  Medication Sig Dispense Refill  . acetaminophen (TYLENOL) 500 MG tablet Take 500-1,000 mg by mouth every 6 (six) hours as needed for moderate pain or headache.     .Marland Kitchenaspirin 325 MG tablet Take 1 tablet (325 mg total) by mouth daily.    .Marland Kitchenb complex vitamins capsule Take 1 capsule by mouth daily. 30 capsule 3  . feeding supplement, GLUCERNA SHAKE, (GLUCERNA SHAKE) LIQD Take 237 mLs by mouth 2 (two) times daily between meals.  0  . fluticasone (FLONASE) 50 MCG/ACT nasal spray Place 1 spray into both nostrils daily.     . folic acid (FOLVITE) 1 MG tablet Take 1 mg by mouth daily.     . furosemide (LASIX) 40 MG tablet Take 1 tablet (40 mg total) by mouth 2 (two) times daily. 180 tablet 1  . gabapentin (NEURONTIN) 100 MG capsule Take 1 capsule (100 mg total) by mouth 3 (three) times daily.    . hydrocerin (EUCERIN) CREA Apply Eucerin cream to BLE Q day after bathing and roughly towel drying to remove loose skin  228 g 0  . HYDROcodone-acetaminophen (NORCO/VICODIN) 5-325 MG per tablet Take 1 tablet by mouth every 6 (six) hours as needed for moderate pain. 30 tablet 0  . LORazepam (ATIVAN) 0.5 MG tablet Take 0.5 mg by mouth every 6 (six) hours as needed for anxiety.    . metoprolol tartrate (LOPRESSOR) 25 MG tablet Take 1 tablet (25 mg total) by mouth 2 (two) times daily. 60 tablet 6  . senna-docusate (SENOKOT-S) 8.6-50 MG per tablet Take 1  tablet by mouth at bedtime.    . sertraline (ZOLOFT) 50 MG tablet Take 1 tablet (50 mg total) by mouth daily. 30 tablet 6  . sodium chloride (OCEAN) 0.65 % SOLN nasal spray Place 1-2 sprays into both nostrils every 4 (four) hours as needed for congestion. (Patient not taking: Reported on 08/21/2014)  0   No current facility-administered medications for this visit.    Allergies  Review of patient's allergies indicates no known allergies.  Electrocardiogram:  SR rate 68 LVH PAC  12/28/13  rate 80  T wave inversions inf/ and V1-3  Assessment and Plan Edema: Abnormal ECG HTN:

## 2015-01-15 ENCOUNTER — Encounter: Payer: Self-pay | Admitting: Cardiovascular Disease

## 2015-01-21 ENCOUNTER — Inpatient Hospital Stay (HOSPITAL_COMMUNITY)
Admission: EM | Admit: 2015-01-21 | Discharge: 2015-01-29 | DRG: 291 | Disposition: A | Discharge status: Home Health | Payer: Medicare Other | Attending: Internal Medicine | Admitting: Internal Medicine

## 2015-01-21 ENCOUNTER — Inpatient Hospital Stay (HOSPITAL_COMMUNITY): Payer: Medicare Other

## 2015-01-21 ENCOUNTER — Encounter (HOSPITAL_COMMUNITY): Payer: Self-pay | Admitting: Emergency Medicine

## 2015-01-21 ENCOUNTER — Emergency Department (HOSPITAL_COMMUNITY): Payer: Medicare Other

## 2015-01-21 DIAGNOSIS — I8312 Varicose veins of left lower extremity with inflammation: Secondary | ICD-10-CM

## 2015-01-21 DIAGNOSIS — I5043 Acute on chronic combined systolic (congestive) and diastolic (congestive) heart failure: Principal | ICD-10-CM

## 2015-01-21 DIAGNOSIS — E875 Hyperkalemia: Secondary | ICD-10-CM | POA: Diagnosis not present

## 2015-01-21 DIAGNOSIS — Z7951 Long term (current) use of inhaled steroids: Secondary | ICD-10-CM | POA: Diagnosis not present

## 2015-01-21 DIAGNOSIS — I5031 Acute diastolic (congestive) heart failure: Secondary | ICD-10-CM | POA: Diagnosis present

## 2015-01-21 DIAGNOSIS — I129 Hypertensive chronic kidney disease with stage 1 through stage 4 chronic kidney disease, or unspecified chronic kidney disease: Secondary | ICD-10-CM | POA: Diagnosis present

## 2015-01-21 DIAGNOSIS — R0602 Shortness of breath: Secondary | ICD-10-CM | POA: Diagnosis not present

## 2015-01-21 DIAGNOSIS — Z6834 Body mass index (BMI) 34.0-34.9, adult: Secondary | ICD-10-CM | POA: Diagnosis not present

## 2015-01-21 DIAGNOSIS — D649 Anemia, unspecified: Secondary | ICD-10-CM

## 2015-01-21 DIAGNOSIS — D72829 Elevated white blood cell count, unspecified: Secondary | ICD-10-CM | POA: Diagnosis present

## 2015-01-21 DIAGNOSIS — Z7982 Long term (current) use of aspirin: Secondary | ICD-10-CM

## 2015-01-21 DIAGNOSIS — D473 Essential (hemorrhagic) thrombocythemia: Secondary | ICD-10-CM | POA: Diagnosis present

## 2015-01-21 DIAGNOSIS — Z9111 Patient's noncompliance with dietary regimen: Secondary | ICD-10-CM | POA: Diagnosis present

## 2015-01-21 DIAGNOSIS — R0600 Dyspnea, unspecified: Secondary | ICD-10-CM

## 2015-01-21 DIAGNOSIS — I472 Ventricular tachycardia: Secondary | ICD-10-CM | POA: Diagnosis not present

## 2015-01-21 DIAGNOSIS — R079 Chest pain, unspecified: Secondary | ICD-10-CM | POA: Diagnosis not present

## 2015-01-21 DIAGNOSIS — N179 Acute kidney failure, unspecified: Secondary | ICD-10-CM | POA: Diagnosis not present

## 2015-01-21 DIAGNOSIS — N183 Chronic kidney disease, stage 3 unspecified: Secondary | ICD-10-CM | POA: Diagnosis present

## 2015-01-21 DIAGNOSIS — Z79899 Other long term (current) drug therapy: Secondary | ICD-10-CM

## 2015-01-21 DIAGNOSIS — D471 Chronic myeloproliferative disease: Secondary | ICD-10-CM

## 2015-01-21 DIAGNOSIS — I712 Thoracic aortic aneurysm, without rupture, unspecified: Secondary | ICD-10-CM | POA: Diagnosis present

## 2015-01-21 DIAGNOSIS — R609 Edema, unspecified: Secondary | ICD-10-CM | POA: Diagnosis not present

## 2015-01-21 DIAGNOSIS — I2510 Atherosclerotic heart disease of native coronary artery without angina pectoris: Secondary | ICD-10-CM | POA: Diagnosis present

## 2015-01-21 DIAGNOSIS — E118 Type 2 diabetes mellitus with unspecified complications: Secondary | ICD-10-CM | POA: Diagnosis present

## 2015-01-21 DIAGNOSIS — G4733 Obstructive sleep apnea (adult) (pediatric): Secondary | ICD-10-CM | POA: Diagnosis present

## 2015-01-21 DIAGNOSIS — R05 Cough: Secondary | ICD-10-CM | POA: Diagnosis not present

## 2015-01-21 DIAGNOSIS — J9601 Acute respiratory failure with hypoxia: Secondary | ICD-10-CM | POA: Diagnosis present

## 2015-01-21 DIAGNOSIS — I872 Venous insufficiency (chronic) (peripheral): Secondary | ICD-10-CM | POA: Diagnosis present

## 2015-01-21 DIAGNOSIS — I8311 Varicose veins of right lower extremity with inflammation: Secondary | ICD-10-CM | POA: Diagnosis not present

## 2015-01-21 DIAGNOSIS — I272 Other secondary pulmonary hypertension: Secondary | ICD-10-CM | POA: Diagnosis present

## 2015-01-21 DIAGNOSIS — C946 Myelodysplastic disease, not classified: Secondary | ICD-10-CM | POA: Diagnosis present

## 2015-01-21 DIAGNOSIS — D7581 Myelofibrosis: Secondary | ICD-10-CM | POA: Diagnosis present

## 2015-01-21 DIAGNOSIS — Z87891 Personal history of nicotine dependence: Secondary | ICD-10-CM

## 2015-01-21 DIAGNOSIS — I509 Heart failure, unspecified: Secondary | ICD-10-CM

## 2015-01-21 LAB — CBC
HCT: 24.2 % — ABNORMAL LOW (ref 39.0–52.0)
HEMOGLOBIN: 7.2 g/dL — AB (ref 13.0–17.0)
MCH: 24.3 pg — AB (ref 26.0–34.0)
MCHC: 29.8 g/dL — AB (ref 30.0–36.0)
MCV: 81.8 fL (ref 78.0–100.0)
PLATELETS: 576 10*3/uL — AB (ref 150–400)
RBC: 2.96 MIL/uL — AB (ref 4.22–5.81)
RDW: 21.8 % — ABNORMAL HIGH (ref 11.5–15.5)
WBC: 15.8 10*3/uL — ABNORMAL HIGH (ref 4.0–10.5)

## 2015-01-21 LAB — GLUCOSE, CAPILLARY
GLUCOSE-CAPILLARY: 109 mg/dL — AB (ref 65–99)
Glucose-Capillary: 97 mg/dL (ref 65–99)
Glucose-Capillary: 118 mg/dL — ABNORMAL HIGH (ref 65–99)

## 2015-01-21 LAB — POC OCCULT BLOOD, ED: FECAL OCCULT BLD: NEGATIVE

## 2015-01-21 LAB — COMPREHENSIVE METABOLIC PANEL
ALT: 22 U/L (ref 17–63)
AST: 48 U/L — ABNORMAL HIGH (ref 15–41)
Albumin: 3.2 g/dL — ABNORMAL LOW (ref 3.5–5.0)
Alkaline Phosphatase: 99 U/L (ref 38–126)
Anion gap: 7 (ref 5–15)
BUN: 23 mg/dL — ABNORMAL HIGH (ref 6–20)
CHLORIDE: 109 mmol/L (ref 101–111)
CO2: 23 mmol/L (ref 22–32)
Calcium: 9.5 mg/dL (ref 8.9–10.3)
Creatinine, Ser: 1.44 mg/dL — ABNORMAL HIGH (ref 0.61–1.24)
GFR, EST AFRICAN AMERICAN: 54 mL/min — AB (ref >60)
GFR, EST NON AFRICAN AMERICAN: 47 mL/min — AB (ref >60)
Glucose, Bld: 102 mg/dL — ABNORMAL HIGH (ref 65–99)
POTASSIUM: 3.8 mmol/L (ref 3.5–5.1)
SODIUM: 139 mmol/L (ref 135–145)
Total Bilirubin: 0.5 mg/dL (ref 0.3–1.2)
Total Protein: 6.3 g/dL — ABNORMAL LOW (ref 6.5–8.1)

## 2015-01-21 LAB — TROPONIN I

## 2015-01-21 LAB — I-STAT TROPONIN, ED: TROPONIN I, POC: 0.01 ng/mL (ref 0.00–0.08)

## 2015-01-21 LAB — PREPARE RBC (CROSSMATCH)

## 2015-01-21 LAB — BRAIN NATRIURETIC PEPTIDE: B NATRIURETIC PEPTIDE 5: 518.8 pg/mL — AB (ref 0.0–100.0)

## 2015-01-21 MED ORDER — FUROSEMIDE 10 MG/ML IJ SOLN
40.0000 mg | Freq: Once | INTRAMUSCULAR | Status: AC
  Administered 2015-01-21: 40 mg via INTRAVENOUS
  Filled 2015-01-21: qty 4

## 2015-01-21 MED ORDER — ACETAMINOPHEN 500 MG PO TABS: 500.0000 mg | ORAL_TABLET | Freq: Four times a day (QID) | ORAL | Status: DC | PRN

## 2015-01-21 MED ORDER — HYDROCODONE-ACETAMINOPHEN 5-325 MG PO TABS: 1.0000 | ORAL_TABLET | Freq: Four times a day (QID) | ORAL | Status: DC | PRN

## 2015-01-21 MED ORDER — FLUTICASONE PROPIONATE 50 MCG/ACT NA SUSP
2.0000 | Freq: Every day | NASAL | Status: DC
  Administered 2015-01-22 – 2015-01-29 (×8): 2 via NASAL
  Filled 2015-01-21: qty 16

## 2015-01-21 MED ORDER — SENNOSIDES-DOCUSATE SODIUM 8.6-50 MG PO TABS: 1.0000 | ORAL_TABLET | Freq: Every evening | ORAL | Status: DC | PRN

## 2015-01-21 MED ORDER — CETYLPYRIDINIUM CHLORIDE 0.05 % MT LIQD
7.0000 mL | Freq: Two times a day (BID) | OROMUCOSAL | Status: DC
  Administered 2015-01-22 – 2015-01-29 (×13): 7 mL via OROMUCOSAL

## 2015-01-21 MED ORDER — SODIUM CHLORIDE 0.9 % IV SOLN: Freq: Once | INTRAVENOUS | Status: DC

## 2015-01-21 MED ORDER — METOPROLOL TARTRATE 12.5 MG HALF TABLET
12.5000 mg | ORAL_TABLET | Freq: Two times a day (BID) | ORAL | Status: DC
Start: 2015-01-21 — End: 2015-01-25
  Administered 2015-01-21 – 2015-01-25 (×8): 12.5 mg via ORAL
  Filled 2015-01-21 (×8): qty 1

## 2015-01-21 MED ORDER — ASPIRIN 325 MG PO TABS
325.0000 mg | ORAL_TABLET | Freq: Every day | ORAL | Status: DC
  Administered 2015-01-21 – 2015-01-29 (×9): 325 mg via ORAL
  Filled 2015-01-21 (×9): qty 1

## 2015-01-21 MED ORDER — INSULIN ASPART 100 UNIT/ML ~~LOC~~ SOLN: 0.0000 [IU] | Freq: Every day | SUBCUTANEOUS | Status: DC

## 2015-01-21 MED ORDER — FUROSEMIDE 10 MG/ML IJ SOLN
60.0000 mg | Freq: Two times a day (BID) | INTRAMUSCULAR | Status: DC
  Administered 2015-01-21 – 2015-01-22 (×2): 60 mg via INTRAVENOUS
  Filled 2015-01-21 (×2): qty 6

## 2015-01-21 MED ORDER — INSULIN ASPART 100 UNIT/ML ~~LOC~~ SOLN: 0.0000 [IU] | Freq: Three times a day (TID) | SUBCUTANEOUS | Status: DC

## 2015-01-21 MED ORDER — HEPARIN SODIUM (PORCINE) 5000 UNIT/ML IJ SOLN
5000.0000 [IU] | Freq: Three times a day (TID) | INTRAMUSCULAR | Status: DC
  Administered 2015-01-21 – 2015-01-29 (×23): 5000 [IU] via SUBCUTANEOUS
  Filled 2015-01-21 (×20): qty 1

## 2015-01-21 MED ORDER — GUAIFENESIN-DM 100-10 MG/5ML PO SYRP
15.0000 mL | ORAL_SOLUTION | ORAL | Status: DC | PRN
  Administered 2015-01-21 – 2015-01-29 (×3): 15 mL via ORAL
  Filled 2015-01-21 (×4): qty 15

## 2015-01-21 MED ORDER — SODIUM CHLORIDE 0.9 % IV SOLN: 250.0000 mL | INTRAVENOUS | Status: DC | PRN

## 2015-01-21 MED ORDER — SODIUM CHLORIDE 0.9 % IJ SOLN: 3.0000 mL | INTRAMUSCULAR | Status: DC | PRN

## 2015-01-21 MED ORDER — GABAPENTIN 100 MG PO CAPS: 100.0000 mg | ORAL_CAPSULE | Freq: Three times a day (TID) | ORAL | Status: DC

## 2015-01-21 MED ORDER — ONDANSETRON HCL 4 MG/2ML IJ SOLN: 4.0000 mg | Freq: Four times a day (QID) | INTRAMUSCULAR | Status: DC | PRN

## 2015-01-21 MED ORDER — SODIUM CHLORIDE 0.9 % IJ SOLN
3.0000 mL | Freq: Two times a day (BID) | INTRAMUSCULAR | Status: DC
  Administered 2015-01-22 – 2015-01-27 (×12): 3 mL via INTRAVENOUS

## 2015-01-21 MED ORDER — INFLUENZA VAC SPLIT QUAD 0.5 ML IM SUSY
0.5000 mL | PREFILLED_SYRINGE | INTRAMUSCULAR | Status: AC
  Administered 2015-01-22: 0.5 mL via INTRAMUSCULAR
  Filled 2015-01-21: qty 0.5

## 2015-01-21 MED ORDER — POTASSIUM CHLORIDE CRYS ER 20 MEQ PO TBCR
40.0000 meq | EXTENDED_RELEASE_TABLET | Freq: Every day | ORAL | Status: DC
  Administered 2015-01-21 – 2015-01-25 (×5): 40 meq via ORAL
  Filled 2015-01-21 (×5): qty 2

## 2015-01-21 NOTE — ED Provider Notes (Signed)
CSN: [id number]     Arrival date & time 01/21/15  0745 History   First MD Initiated Contact with Patient 01/21/15 0757     Chief Complaint  Patient presents with  . Leg Pain  . Leg Swelling  . Shortness of Breath     (Consider location/radiation/quality/duration/timing/severity/associated sxs/prior Treatment) Patient is a 74 y.o. male presenting with leg pain.  Leg Pain Location:  Leg Time since incident:  3 weeks Injury: no   Leg location:  L leg and R leg Pain details:    Quality:  Aching   Radiates to:  Does not radiate   Severity:  Moderate   Onset quality:  Gradual   Duration:  3 days   Timing:  Constant   Progression:  Worsening Chronicity:  Recurrent Prior injury to area:  No Relieved by:  None tried Worsened by:  Nothing tried Ineffective treatments:  None tried Associated symptoms: swelling   Associated symptoms: no fever   Associated symptoms comment:  SOB Risk factors comment:  Chf, recurrent cellulitis   Past Medical History  Diagnosis Date  . CAD (coronary artery disease)     dx elsewheer in past, no documentation. Non-ischemic myovue 2007  . CHF (congestive heart failure)     dx elsewhere, no documentation. Normal EF by previous echo. Trival AS needs f/u ECHO by 2012  . Hypertension   . Sleep apnea, obstructive     at some point used CPAP, was d/c  years ago  . Anemia   . History of thrombocytosis   . Allergic rhinitis   . Edema     R>L leg, u/s 5-12 neg for DVT  . Hemorrhoid   . Type II diabetes mellitus   . Sinus congestion   . Migraine     "once/wk at least" (07/11/2013)  . Shortness of breath   . Myeloproliferative disease   . Ascending aortic aneurysm 03/2014    4.3cm on CT scan   Past Surgical History  Procedure Laterality Date  . Toe surgery Right     "tried to straighten out big toe" (07/11/2013)   Family History  Problem Relation Age of Onset  . Colon cancer Neg Hx   . Prostate cancer Neg Hx   . Heart attack Neg Hx   .  Diabetes Neg Hx   . Schizophrenia Son    Social History  Substance Use Topics  . Smoking status: Former Smoker -- 0.25 packs/day for 12 years    Types: Cigarettes    Quit date: 05/18/1966  . Smokeless tobacco: Never Used     Comment: quit smoking 45 years ago  . Alcohol Use: No    Review of Systems  Constitutional: Negative for fever and chills.  HENT: Negative for congestion and sore throat.   Eyes: Negative for visual disturbance.  Respiratory: Positive for shortness of breath. Negative for cough and wheezing.   Cardiovascular: Positive for leg swelling. Negative for chest pain.  Gastrointestinal: Negative for nausea, vomiting, abdominal pain, diarrhea and constipation.  Genitourinary: Negative for dysuria and difficulty urinating.  Musculoskeletal: Positive for myalgias and joint swelling. Negative for arthralgias.  Skin: Negative for wound.  Neurological: Negative for syncope and headaches.  Psychiatric/Behavioral: Negative for behavioral problems.  All other systems reviewed and are negative.     Allergies  Review of patient's allergies indicates no known allergies.  Home Medications   Prior to Admission medications   Medication Sig Start Date End Date Taking? Authorizing Provider  acetaminophen (TYLENOL) 500  MG tablet Take 500 mg by mouth every 6 (six) hours as needed for moderate pain or headache.    Yes Historical Provider, MD  Ascorbic Acid (VITAMIN C PO) Take 1 tablet by mouth daily.   Yes Historical Provider, MD  aspirin 325 MG tablet Take 1 tablet (325 mg total) by mouth daily. 04/16/14  Yes Ripudeep K Rai, MD  b complex vitamins capsule Take 1 capsule by mouth daily. 01/12/13  Yes Irene Pap, NP  feeding supplement, GLUCERNA SHAKE, (GLUCERNA SHAKE) LIQD Take 237 mLs by mouth 2 (two) times daily between meals. Patient taking differently: Take 237 mLs by mouth as needed (for protein).  04/10/14  Yes Barton Dubois, MD  Ferrous Gluconate (IRON) 240 (27 FE) MG  TABS Take 1 tablet by mouth daily.   Yes Historical Provider, MD  fluticasone (FLONASE) 50 MCG/ACT nasal spray Place 2 sprays into both nostrils daily.    Yes Historical Provider, MD  furosemide (LASIX) 40 MG tablet Take 1 tablet (40 mg total) by mouth 2 (two) times daily. Patient taking differently: Take 40 mg by mouth daily.  10/18/14  Yes Colon Branch, MD  gabapentin (NEURONTIN) 100 MG capsule Take 1 capsule (100 mg total) by mouth 3 (three) times daily. Patient taking differently: Take 100 mg by mouth 3 (three) times daily as needed (for pain).  06/20/14  Yes Debbe Odea, MD  hydrocerin (EUCERIN) CREA Apply Eucerin cream to BLE Q day after bathing and roughly towel drying to remove loose skin 01/27/14  Yes Shanker Kristeen Mans, MD  HYDROcodone-acetaminophen (NORCO/VICODIN) 5-325 MG per tablet Take 1 tablet by mouth every 6 (six) hours as needed for moderate pain. 10/18/14  Yes Colon Branch, MD  senna-docusate (SENOKOT-S) 8.6-50 MG per tablet Take 1 tablet by mouth at bedtime. Patient taking differently: Take 1 tablet by mouth at bedtime as needed for moderate constipation.  06/20/14  Yes Debbe Odea, MD  metoprolol tartrate (LOPRESSOR) 25 MG tablet Take 1 tablet (25 mg total) by mouth 2 (two) times daily. Patient not taking: Reported on 01/21/2015 10/09/14   Colon Branch, MD  sertraline (ZOLOFT) 50 MG tablet Take 1 tablet (50 mg total) by mouth daily. Patient not taking: Reported on 01/21/2015 10/09/14   Colon Branch, MD  sodium chloride (OCEAN) 0.65 % SOLN nasal spray Place 1-2 sprays into both nostrils every 4 (four) hours as needed for congestion. Patient not taking: Reported on 08/21/2014 04/10/14   Barton Dubois, MD   BP 142/79 mmHg  Pulse 89  Temp(Src) 97.7 F (36.5 C) (Oral)  Resp 18  Ht '6\' 3"'$  (1.905 m)  Wt 245 lb (111.131 kg)  BMI 30.62 kg/m2  SpO2 87% Physical Exam  Constitutional: He is oriented to person, place, and time. He appears well-developed and well-nourished.  HENT:  Head: Normocephalic and  atraumatic.  Eyes: EOM are normal.  Neck: Normal range of motion.  Cardiovascular: Normal rate, regular rhythm, normal heart sounds and intact distal pulses.   No murmur heard. Pulmonary/Chest: Effort normal. No respiratory distress. He has no wheezes. He has no rales.  Bibasilar crackles  Abdominal: Soft. There is no tenderness.  Musculoskeletal: He exhibits edema.  Neurological: He is alert and oriented to person, place, and time.  Skin: He is not diaphoretic.  B/l 4+ pitting edema w/ chronic skin changes     ED Course  Procedures (including critical care time) Labs Review Labs Reviewed  CBC - Abnormal; Notable for the following:  WBC 15.8 (*)    RBC 2.96 (*)    Hemoglobin 7.2 (*)    HCT 24.2 (*)    MCH 24.3 (*)    MCHC 29.8 (*)    RDW 21.8 (*)    Platelets 576 (*)    All other components within normal limits  BRAIN NATRIURETIC PEPTIDE - Abnormal; Notable for the following:    B Natriuretic Peptide 518.8 (*)    All other components within normal limits  COMPREHENSIVE METABOLIC PANEL - Abnormal; Notable for the following:    Glucose, Bld 102 (*)    BUN 23 (*)    Creatinine, Ser 1.44 (*)    Total Protein 6.3 (*)    Albumin 3.2 (*)    AST 48 (*)    GFR calc non Af Amer 47 (*)    GFR calc Af Amer 54 (*)    All other components within normal limits  I-STAT TROPOININ, ED  POC OCCULT BLOOD, ED  TYPE AND SCREEN    Imaging Review Dg Chest 2 View  01/21/2015   CLINICAL DATA:  Chest pain, shortness of breath and cough for 3 weeks. History of CHF, hypertension and diabetes  EXAM: CHEST  2 VIEW  COMPARISON:  06/16/2014; chest CT- 06/21/2014  FINDINGS: Grossly unchanged enlarged cardiac silhouette and mediastinal contours given persistently reduced volumes. Pulmonary vasculature appears indistinct with cephalization of flow. Grossly unchanged bilateral infrahilar heterogeneous opacities, right greater than left. There is persistent moderate elevation of the right hemidiaphragm.  No pleural effusion or pneumothorax. No evidence of edema. No acute osseus abnormalities. Re- demonstrated diffuse increased sclerosis of the osseous structures compatible with provided history of myeloproliferative disease.  IMPRESSION: 1. Suspected mild pulmonary edema on this hypoventilated examination. No definite pleural effusions. 2. Persistent moderate elevation of the right hemidiaphragm with associated bilateral infrahilar opacities, likely atelectasis. 3. Unchanged diffuse increased sclerosis of the imaged osseous structures compatible with provided history of myeloproliferative disease.   Electronically Signed   By: Sandi Mariscal M.D.   On: 01/21/2015 08:52   I have personally reviewed and evaluated these images and lab results as part of my medical decision-making.   EKG Interpretation None      MDM   Final diagnoses:  Anemia, unspecified anemia type  CHF exacerbation    Juan [name] is a 74 y.o. male with a history of CAD, Diastolic CHF, AML, HTN, DM2, ?peripheral neuropathy who presented to the ED with complaints of worsening BLE edema/pain in feet and difficulty walking as well as weakness,malaise and SOB for 3 weeks.  That's similar presentation in the past at that time he had a pneumonia and cellulitis. On arrival to the ED today the patient's afebrile hypoxic to 87% on room air otherwise vitals are stable. On physical exam patient has normal heart sounds and faint bibasilar crackles, JVD, and 4+ pitting edema. Patient has no redness or warmth to his lower extremities however there are chronic changes on his physical exam. We will do an evaluation for the patient's new onset hypoxia to look for cardiac versus infectious etiology.  Patient's workup significant for anemia. Patient's hemoglobin today is 7.2 and baseline is 9.5. Patient's troponin is normal. Patient's EKG is with nonspecific T-wave changes compared to prior study on January 2016 and patient states he has very mild chest  discomfort however this is been persistent in nature and not worsening. Patient states she has had some melanotic stool 3 weeks ago however today patient's Hemoccult was negative. Patient states  he had a colonoscopy but it was a long time ago and he does not remember the results. Patient also has an elevated BNP with pulmonary edema on chest x-ray.. We will give him IV Lasix and admit him to the hospitalist for further evaluation. Type and screen has been sent.  Renne Musca, MD 01/21/15 [place], MD 01/24/15 [id number]

## 2015-01-21 NOTE — Consult Note (Signed)
WOC wound consult note Reason for Consult: LE Wound type: no open wounds on the LE, he has profound venous stasis brawny skin changes, but no open ulcerations. Clinical CEAP level 5. 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 It continues to be reepithelialized Today he also complains of rash on his genitalia, however upon my assessment it is more on his inner thighs.   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 Snelling the need for compression for his LE. He refuses compression stockings or compression wraps.  He continues to state that "his doctor in [place] told me not to wear them wraps cause of his heart".  I think it would be beneficial to obtain ABI and if they are normal to compress this gentleman which would lessen the swelling and prevent any weeping and associated cellulitis.  However patient does not agree with this and continues over and over to state "antibiotics helps my swelling".   As far at the rash goes on his inner thigh, probable that some of the discomfort is related to the swelling he has (3+).  He does have some redness.  I have questioned him about incontinence and he reports he has a BSC near his bed at home, but he does wear incontinence briefs and changes then "once or twice a day".  He reports when he changes them he "cleans himself real good and dries himself real good" . The area of concern does appear to possibly to be related to moisture, does not appear to be fungal.  I have advised him that not wearing the briefs may help the skin here and use a zinc based barrier cream to the intact skin to protect from exposure to urine.   Pt is appreciative of my visit. Should the admitted MD decide to obtain ABIs and convince the patient to use compression please re consult our team.  Para March  RN,CWOCN [phone]

## 2015-01-21 NOTE — ED Notes (Signed)
Pt c/o pain and swelling to B/L legs and feet. Pt also reports shortness of breath and pain in his face. Pt seen by PMD and given antibiotics but not getting any better. Legs warm, lower calf hard to touch

## 2015-01-21 NOTE — ED Notes (Signed)
MD at bedside. 

## 2015-01-21 NOTE — Progress Notes (Signed)
*  PRELIMINARY RESULTS* Echocardiogram 2D Echocardiogram has been performed.  Leavy Cella 01/21/2015, 4:16 PM

## 2015-01-21 NOTE — H&P (Signed)
[facility] History and Physical                                                                                    Juan [name], is a 74 y.o. male  MRN: [id number]   DOB - January 14, 1941  Admit Date - 01/21/2015  Outpatient Primary MD for the patient is [name], MD  Referring Physician:  Dr. Robina Ade  Chief Complaint:   Chief Complaint  Patient presents with  . Leg Pain  . Leg Swelling  . Shortness of Breath     HPI  Juan [name]  is a 74 y.o. male, with DHF, OSA, HTN, DM2, and myeloproliferative disorder presents with SOB that has been worsening over the last 3 weeks.  Mr. Shankel reports that he lives at home with his son.  3 weeks ago his legs started swelling and he developed a cough with clear sputum.  He is unable to lie flat at home and sleeps in his hospital bed with the [facility] elevated.  Still he becomes SOB at times and has to sit up on the side of the bed.  He does not wear oxygen at home and becomes acutely SOB trying to walk on his walker to the bathroom.  3 weeks ago he was mobile thru out the interior of his home with no dyspnea.  He has chronic leg swelling and stasis dermatitis but over the last 3 weeks his LE edema has increased significantly and now he has swelling up to his thighs.  He states he has stinging in his legs that feel like wasps stinging him.  Finally he complains of chest pain in the center of his chest and his shoulders bilaterally.  It sometimes feels sharp and sometimes dull.  It is worse when he is SOB, it improves when sitting up and with tylenol.  It lasts for approximately 10 minutes when he has it.  In the ER, BNP is elevated, CXR shows mild pulm edema, he has JVD and 4+ edema in his legs bilaterally.  Hgb is 7.2 down from 9.4.  He is guiac negative on EDP exam.  His O2 sats were 87% on ER admission.  Review of Systems   In addition to the HPI above,  No Fever-chills, No Headache, No changes with Vision or hearing, No problems swallowing food or Liquids, No  Abdominal pain, No Nausea or Vomiting, Bowel movements are regular, No Blood in stool or Urine, No dysuria, No new skin rashes or bruises, No new joints pains-aches,  No new weakness, tingling, numbness in any extremity,  A full 10 point Review of Systems was done, except as stated above, all other Review of Systems were negative.  Past Medical History  Past Medical History  Diagnosis Date  . CAD (coronary artery disease)     dx elsewheer in past, no documentation. Non-ischemic myovue 2007  . CHF (congestive heart failure)     dx elsewhere, no documentation. Normal EF by previous echo. Trival AS needs f/u ECHO by 2012  . Hypertension   . Sleep apnea, obstructive     at some point used CPAP, was d/c  years ago  . Anemia   .  History of thrombocytosis   . Allergic rhinitis   . Edema     R>L leg, u/s 5-12 neg for DVT  . Hemorrhoid   . Type II diabetes mellitus   . Sinus congestion   . Migraine     "once/wk at least" (07/11/2013)  . Shortness of breath   . Myeloproliferative disease   . Ascending aortic aneurysm 03/2014    4.3cm on CT scan    Past Surgical History  Procedure Laterality Date  . Toe surgery Right     "tried to straighten out big toe" (07/11/2013)      Social History Social History  Substance Use Topics  . Smoking status: Former Smoker -- 0.25 packs/day for 12 years    Types: Cigarettes    Quit date: 05/18/1966  . Smokeless tobacco: Never Used     Comment: quit smoking 45 years ago  . Alcohol Use: No  Lives at home with son.  Needs some assistance with ADLs.  Family History Family History  Problem Relation Age of Onset  . Colon cancer Neg Hx   . Prostate cancer Neg Hx   . Heart attack Neg Hx   . Diabetes Neg Hx   . Schizophrenia Son     Prior to Admission medications   Medication Sig Start Date End Date Taking? Authorizing Provider  acetaminophen (TYLENOL) 500 MG tablet Take 500 mg by mouth every 6 (six) hours as needed for moderate pain or  headache.    Yes Historical Provider, MD  Ascorbic Acid (VITAMIN C PO) Take 1 tablet by mouth daily.   Yes Historical Provider, MD  aspirin 325 MG tablet Take 1 tablet (325 mg total) by mouth daily. 04/16/14  Yes Ripudeep K Rai, MD  b complex vitamins capsule Take 1 capsule by mouth daily. 01/12/13  Yes Irene Pap, NP  feeding supplement, GLUCERNA SHAKE, (GLUCERNA SHAKE) LIQD Take 237 mLs by mouth 2 (two) times daily between meals. Patient taking differently: Take 237 mLs by mouth as needed (for protein).  04/10/14  Yes Barton Dubois, MD  Ferrous Gluconate (IRON) 240 (27 FE) MG TABS Take 1 tablet by mouth daily.   Yes Historical Provider, MD  fluticasone (FLONASE) 50 MCG/ACT nasal spray Place 2 sprays into both nostrils daily.    Yes Historical Provider, MD  furosemide (LASIX) 40 MG tablet Take 1 tablet (40 mg total) by mouth 2 (two) times daily. Patient taking differently: Take 40 mg by mouth daily.  10/18/14  Yes Colon Branch, MD  gabapentin (NEURONTIN) 100 MG capsule Take 1 capsule (100 mg total) by mouth 3 (three) times daily. Patient taking differently: Take 100 mg by mouth 3 (three) times daily as needed (for pain).  06/20/14  Yes Debbe Odea, MD  hydrocerin (EUCERIN) CREA Apply Eucerin cream to BLE Q day after bathing and roughly towel drying to remove loose skin 01/27/14  Yes Shanker Kristeen Mans, MD  HYDROcodone-acetaminophen (NORCO/VICODIN) 5-325 MG per tablet Take 1 tablet by mouth every 6 (six) hours as needed for moderate pain. 10/18/14  Yes Colon Branch, MD  senna-docusate (SENOKOT-S) 8.6-50 MG per tablet Take 1 tablet by mouth at bedtime. Patient taking differently: Take 1 tablet by mouth at bedtime as needed for moderate constipation.  06/20/14  Yes Debbe Odea, MD  metoprolol tartrate (LOPRESSOR) 25 MG tablet Take 1 tablet (25 mg total) by mouth 2 (two) times daily. Patient not taking: Reported on 01/21/2015 10/09/14   Colon Branch, MD  sertraline (ZOLOFT)  50 MG tablet Take 1 tablet (50 mg total)  by mouth daily. Patient not taking: Reported on 01/21/2015 10/09/14   Colon Branch, MD    No Known Allergies  Physical Exam  Vitals  Blood pressure 138/76, pulse 82, temperature 97.7 F (36.5 C), temperature source Oral, resp. rate 18, height '6\' 3"'$  (1.905 m), weight 111.131 kg (245 lb), SpO2 100 %.   General:  lying in bed in NAD, very pleasant.  Oxygen via nasal canula in place.  Psych:  Normal affect and insight, Not Suicidal or Homicidal, Awake Alert, Oriented X 3.  Neuro:   No F.N deficits, ALL C.Nerves Intact, Strength 5/5 all 4 extremities, Sensation intact all 4 extremities.  ENT:  Ears and Eyes appear Normal, Conjunctivae clear, PER. Moist oral mucosa without erythema or exudates.  Neck:  Supple, No lymphadenopathy appreciated  Respiratory:  Symmetrical chest wall movement, Good air movement bilaterally, CTAB.  Cardiac:  RRR, No Murmurs, no LE edema noted, no JVD.    Abdomen:  Positive bowel sounds, Soft, Non tender, Non distended,  No masses appreciated  Skin:  No Cyanosis, Normal Skin Turgor, No Skin Rash or Bruise.  Raised dimpled area of stasis change in stocking pattern to both lower extremities.  Extremities:  Able to move all 4. 5/5 strength in each,  no effusions.  Data Review  CBC  Recent Labs Lab 01/21/15 0825  WBC 15.8*  HGB 7.2*  HCT 24.2*  PLT 576*  MCV 81.8  MCH 24.3*  MCHC 29.8*  RDW 21.8*    Chemistries   Recent Labs Lab 01/21/15 0825  NA 139  K 3.8  CL 109  CO2 23  GLUCOSE 102*  BUN 23*  CREATININE 1.44*  CALCIUM 9.5  AST 48*  ALT 22  ALKPHOS 99  BILITOT 0.5     Urinalysis    Component Value Date/Time   COLORURINE YELLOW 09/10/2014 1950   APPEARANCEUR CLEAR 09/10/2014 1950   LABSPEC 1.011 09/10/2014 1950   PHURINE 6.0 09/10/2014 1950   GLUCOSEU NEGATIVE 09/10/2014 1950   HGBUR NEGATIVE 09/10/2014 1950   BILIRUBINUR NEGATIVE 09/10/2014 1950   KETONESUR NEGATIVE 09/10/2014 1950   PROTEINUR NEGATIVE 09/10/2014 1950    UROBILINOGEN 0.2 09/10/2014 1950   NITRITE NEGATIVE 09/10/2014 1950   LEUKOCYTESUR NEGATIVE 09/10/2014 1950    Imaging results:   Dg Chest 2 View  01/21/2015   CLINICAL DATA:  Chest pain, shortness of breath and cough for 3 weeks. History of CHF, hypertension and diabetes  EXAM: CHEST  2 VIEW  COMPARISON:  06/16/2014; chest CT- 06/21/2014  FINDINGS: Grossly unchanged enlarged cardiac silhouette and mediastinal contours given persistently reduced volumes. Pulmonary vasculature appears indistinct with cephalization of flow. Grossly unchanged bilateral infrahilar heterogeneous opacities, right greater than left. There is persistent moderate elevation of the right hemidiaphragm. No pleural effusion or pneumothorax. No evidence of edema. No acute osseus abnormalities. Re- demonstrated diffuse increased sclerosis of the osseous structures compatible with provided history of myeloproliferative disease.  IMPRESSION: 1. Suspected mild pulmonary edema on this hypoventilated examination. No definite pleural effusions. 2. Persistent moderate elevation of the right hemidiaphragm with associated bilateral infrahilar opacities, likely atelectasis. 3. Unchanged diffuse increased sclerosis of the imaged osseous structures compatible with provided history of myeloproliferative disease.   Electronically Signed   By: Sandi Mariscal M.D.   On: 01/21/2015 08:52    My personal review of EKG: prolonged PR interval.  No acute ST changes.   Assessment & Plan  Principal Problem:  Acute diastolic heart failure Active Problems:   Myeloproliferative disease   Venous stasis dermatitis of both lower extremities   Chest pain   Thoracic aortic aneurysm   CKD (chronic kidney disease) stage 3, GFR 30-59 ml/min   Acute Hypoxic respiratory failure secondary to Acute diastolic heart failure  Patient reports compliance with doctors visits, medications and diet. Will cycle troponin, check 2D echo.  Diuresis with IV lasix.   Restart  Coreg (stopped due to patient preference?). Cont ASA. Daily weights, Strict I&O, Low salt diet.  Nutrition for low salt diet education.  Symptomatic Anemia Hgb dropped from 9.5 -> 7.2 in 3 months.  Guiac negative. May be dilutional due to CHF - but will Transfuse 1 unit and monitor closely Continue ASA.  Myeloproliferative Disorder. Currently with elevated WBC and Thrombocytosis (576) Transfusing 1 unit.  Discussed with Oncology on call will use irradiated blood products due to Dr. Vernona Rieger recent note regarding possible acute leukemic transformation (10/2014) - will Monitor.    Bilateral Lower extremity edema Most likely due to CHF but will check dopplers to rule out DVT.   Patient complaining of pain (stinging) and difficulty walking. Elevate legs.  Diurese. Physical Therapy eval requested.  Chest Pain Likely secondary to anemia and DHF. Will cycle troponin.  No Acute ST changes on EKG. Consult cards if Troponins rise. On 325 mg aspirin.  CKD 2-3 Creatinine at baseline.  Ascending thoracic Aorta Last checked in 03/2014. 4.3 cm in diameter.  Likely needs follow up scan outpatient for monitoring.   Consultants Called:  None  Family Communication:   Patient is alert and orientated.  Code Status:  full  Condition:  Stable but guarded.  Potential Disposition: rehab vs home w home health in 3-4 days.  Time spent in minutes : [address]  PA-C on 01/21/2015 at 11:01 AM Between 7am to 7pm - Pager - [phone] After 7pm go to [email or web address] - password [email or web address] And look for the night coverage person covering me after hours  [facility]   Addendum  I personally evaluated patient on 01/21/2015 and agree with the above findings. Mr. Dunshee is a pleasant 74 year old gentleman with a history of chronic diastolic congestive heart failure, morbid obesity, myeloproliferative disorder presenting to the emergency department with complaints of shortness of breath  associate with weight gain and extremity edema. Lab work showing a hemoglobin of 7.2, down from 9.4 on 10/22/2014. On physical exam he has a presence of crackles along with significant bilateral extremity pitting edema. Will admit him to 3 E. start him on IV diuresis. With regard to acute on chronic anemia this could be reflective of myeloproliferative disorder and would benefit from transfusion with 1 unit of packed red blood cells.

## 2015-01-21 NOTE — Progress Notes (Signed)
PT Cancellation Note  Patient Details Name: Juan [name] MRN: [id number] DOB: [birth year]   Cancelled Treatment:    Reason Eval/Treat Not Completed: Other (comment) (pt with bil LE edema awaiting dopplers prior to eval)   [name] 01/21/2015, 1:26 PM Elwyn Reach, [place]

## 2015-01-21 NOTE — Progress Notes (Signed)
Patient has refused CPAP. He claims that when he previously used one, "they gave him colds" and that his Dr. In [place] told him not to use one. Patient is not showing any signs of respiratory distress and RT will continue to monitor.

## 2015-01-22 ENCOUNTER — Inpatient Hospital Stay (HOSPITAL_COMMUNITY): Payer: Medicare Other

## 2015-01-22 DIAGNOSIS — R609 Edema, unspecified: Secondary | ICD-10-CM

## 2015-01-22 DIAGNOSIS — I5043 Acute on chronic combined systolic (congestive) and diastolic (congestive) heart failure: Secondary | ICD-10-CM

## 2015-01-22 LAB — TYPE AND SCREEN
ABO/RH(D): O POS
ANTIBODY SCREEN: NEGATIVE
Unit division: 0

## 2015-01-22 LAB — CBC
HCT: 26.7 % — ABNORMAL LOW (ref 39.0–52.0)
HCT: 26 % — ABNORMAL LOW (ref 39.0–52.0)
HEMOGLOBIN: 8.1 g/dL — AB (ref 13.0–17.0)
Hemoglobin: 8 g/dL — ABNORMAL LOW (ref 13.0–17.0)
MCH: 24.7 pg — ABNORMAL LOW (ref 26.0–34.0)
MCH: 25 pg — ABNORMAL LOW (ref 26.0–34.0)
MCHC: 30.3 g/dL (ref 30.0–36.0)
MCHC: 30.8 g/dL (ref 30.0–36.0)
MCV: 81.4 fL (ref 78.0–100.0)
MCV: 81.3 fL (ref 78.0–100.0)
PLATELETS: 578 10*3/uL — AB (ref 150–400)
Platelets: 550 10*3/uL — ABNORMAL HIGH (ref 150–400)
RBC: 3.28 MIL/uL — ABNORMAL LOW (ref 4.22–5.81)
RBC: 3.2 MIL/uL — ABNORMAL LOW (ref 4.22–5.81)
RDW: 20.8 % — AB (ref 11.5–15.5)
RDW: 20.9 % — AB (ref 11.5–15.5)
WBC: 16.8 10*3/uL — AB (ref 4.0–10.5)
WBC: 16.9 10*3/uL — AB (ref 4.0–10.5)

## 2015-01-22 LAB — BASIC METABOLIC PANEL
Anion gap: 7 (ref 5–15)
BUN: 22 mg/dL — AB (ref 6–20)
CALCIUM: 9.1 mg/dL (ref 8.9–10.3)
CHLORIDE: 107 mmol/L (ref 101–111)
CO2: 26 mmol/L (ref 22–32)
CREATININE: 1.42 mg/dL — AB (ref 0.61–1.24)
GFR calc non Af Amer: 47 mL/min — ABNORMAL LOW (ref >60)
GFR, EST AFRICAN AMERICAN: 55 mL/min — AB (ref >60)
Glucose, Bld: 104 mg/dL — ABNORMAL HIGH (ref 65–99)
Potassium: 4.4 mmol/L (ref 3.5–5.1)
SODIUM: 140 mmol/L (ref 135–145)

## 2015-01-22 LAB — TROPONIN I

## 2015-01-22 LAB — GLUCOSE, CAPILLARY
GLUCOSE-CAPILLARY: 100 mg/dL — AB (ref 65–99)
GLUCOSE-CAPILLARY: 99 mg/dL (ref 65–99)
Glucose-Capillary: 102 mg/dL — ABNORMAL HIGH (ref 65–99)

## 2015-01-22 MED ORDER — FUROSEMIDE 10 MG/ML IJ SOLN
80.0000 mg | Freq: Two times a day (BID) | INTRAMUSCULAR | Status: DC
  Administered 2015-01-22 – 2015-01-27 (×10): 80 mg via INTRAVENOUS
  Filled 2015-01-22 (×9): qty 8

## 2015-01-22 MED ORDER — METOLAZONE 2.5 MG PO TABS
2.5000 mg | ORAL_TABLET | Freq: Every day | ORAL | Status: DC
  Administered 2015-01-22 – 2015-01-26 (×5): 2.5 mg via ORAL
  Filled 2015-01-22 (×5): qty 1

## 2015-01-22 MED ORDER — ENSURE ENLIVE PO LIQD
237.0000 mL | Freq: Two times a day (BID) | ORAL | Status: DC
  Administered 2015-01-23 – 2015-01-26 (×6): 237 mL via ORAL

## 2015-01-22 NOTE — Progress Notes (Signed)
PT Cancellation Note  Patient Details Name: Juan [name] MRN: [id number] DOB: 06-11-1940   Cancelled Treatment:    Reason Eval/Treat Not Completed: Medical issues which prohibited therapy (continue to await bil LE dopplers prior to eval)   [name] 01/22/2015, 7:29 AM Elwyn Reach, [place]

## 2015-01-22 NOTE — Evaluation (Signed)
Physical Therapy Evaluation Patient Details Name: Juan [name] MRN: [id number] DOB: February 11, 1941 Today's Date: 01/22/2015   History of Present Illness  Juan [name] is a 74 y.o. male, with DHF, OSA, HTN, DM2, and myeloproliferative disorder presents with SOB that has been worsening over the last 3 weeks. Juan [name] reports that he lives at home with his son. 3 weeks ago his legs started swelling and he developed a cough with clear sputum. He is unable to lie flat at home and sleeps in his hospital bed with the [facility] elevated. Pt with CHF and pulmonary edema  Clinical Impression  Pt pleasant but reporting fatigue and difficulty mobilizing today. Pt benefits from encouragement and reassurance to maximize function. Pt with difficulty getting OOB, required high surface to stand, states he does not have significant assist from son at home and has poor activity tolerance. Pt would benefit from SNF for D/C which I am recommending due to all stated. Pt states he will refuse SNF and if he does would recommend HHPT and [facility] for home but pt at very high fall risk for home and not able to care for himself at this time. Pt will benefit from acute therapy to maximize mobility, gait and function to increase independence and decrease fall risk.    Follow Up Recommendations SNF;Supervision for mobility/OOB    Equipment Recommendations       Recommendations for Other Services OT consult     Precautions / Restrictions Precautions Precautions: Fall      Mobility  Bed Mobility Overal bed mobility: Needs Assistance Bed Mobility: Supine to Sit     Supine to sit: Min assist;HOB elevated     General bed mobility comments: supine to sit with rail, increased time and HOB elevated. Cues for initiation and continuation of movement to fully pivot to EOB  Transfers Overall transfer level: Needs assistance   Transfers: Sit to/from Stand;Stand Pivot Transfers Sit to Stand: Min assist;+2 safety/equipment;From  elevated surface Stand pivot transfers: Min guard       General transfer comment: pt would not attempt to stand from bed in lowest position and required bed elevated to grossly 32inches before attempting to stand and required min assist with cues for anterior translation and elevation. Once in standing with RW pt able to maintain standing grossly 4 min for pericare secondary to incontinent BM and minguard with cues to pivot to chair  Ambulation/Gait Ambulation/Gait assistance:  (pt unable due to fatigue)              Stairs            Wheelchair Mobility    Modified Rankin (Stroke Patients Only)       Balance Overall balance assessment: Needs assistance Sitting-balance support: Feet supported Sitting balance-Leahy Scale: Fair       Standing balance-Leahy Scale: Poor                               Pertinent Vitals/Pain Pain Assessment: No/denies pain    Home Living Family/patient expects to be discharged to:: Private residence Living Arrangements: Children Available Help at Discharge: Family;Available 24 hours/day Type of Home: Apartment Home Access: Level entry     Home Layout: One level Home Equipment: Cane - single point;Walker - 2 wheels;Hospital bed;Bedside commode Additional Comments: pt lives with son but pt states son is relatively disabled himself    Prior Function Level of Independence: Needs assistance   Gait / Transfers  Assistance Needed: pt reports he walks in the apartment and can perform his own pericare at [facility] and bathing . Son does the housework. Pt states he has to elevate his bed height to be able to stand     Comments: unclear as to accuracy of PLOF; no family present      Hand Dominance        Extremity/Trunk Assessment   Upper Extremity Assessment: Generalized weakness           Lower Extremity Assessment: Generalized weakness         Communication   Communication: No difficulties  Cognition  Arousal/Alertness: Awake/alert Behavior During Therapy: WFL for tasks assessed/performed Overall Cognitive Status: Impaired/Different from baseline Area of Impairment: Safety/judgement         Safety/Judgement: Decreased awareness of safety;Decreased awareness of deficits     General Comments: Pt very slow to process instructions and perform mobility. Pt continues to state he can care for himself despite need for assist for all mobility and education    General Comments      Exercises        Assessment/Plan    PT Assessment Patient needs continued PT services  PT Diagnosis Difficulty walking;Generalized weakness   PT Problem List Decreased strength;Decreased activity tolerance;Decreased range of motion;Decreased balance;Decreased mobility;Decreased knowledge of use of DME;Decreased safety awareness;Decreased skin integrity  PT Treatment Interventions Gait training;DME instruction;Functional mobility training;Therapeutic activities;Therapeutic exercise;Patient/family education   PT Goals (Current goals can be found in the Care Plan section) Acute Rehab PT Goals Patient Stated Goal: return home PT Goal Formulation: With patient Time For Goal Achievement: 02/05/15 Potential to Achieve Goals: Fair    Frequency Min 3X/week   Barriers to discharge Decreased caregiver support      Co-evaluation               End of Session Equipment Utilized During Treatment: Oxygen Activity Tolerance: Patient limited by fatigue Patient left: in chair;with call [place]/phone within reach;with chair alarm set Nurse Communication: Mobility status         Time: 0037-0488 PT Time Calculation (min) (ACUTE ONLY): 27 min   Charges:   PT Evaluation $Initial PT Evaluation Tier I: 1 Procedure PT Treatments $Therapeutic Activity: 8-22 mins   PT G CodesMelford Aase 01/22/2015, 2:07 PM  Elwyn Reach, [place]

## 2015-01-22 NOTE — Progress Notes (Signed)
*  PRELIMINARY RESULTS* Vascular Ultrasound Lower extremity venous duplex has been completed.  Preliminary findings: No DVT noted in visualized veins.    Landry Mellow, RDMS, RVT  01/22/2015, 12:19 PM

## 2015-01-22 NOTE — Progress Notes (Signed)
Utilization review completed. Delaynee Alred, RN, BSN. 

## 2015-01-22 NOTE — Progress Notes (Signed)
Heart Failure Navigator Consult Note  Presentation: Juan [name] is a 74 y.o. male, with dCHF, OSA, HTN, DM2, and myeloproliferative disorder presents with SOB that has been worsening over the last 3 weeks. Juan [name] reports that he lives at home with his son. 3 weeks ago his legs started swelling and he developed a cough with clear sputum. He is unable to lie flat at home and sleeps in his hospital bed with the [facility] elevated. Echocardiogram shows 20% drop in the patient's EF over the past 10 months.    Past Medical History  Diagnosis Date  . CAD (coronary artery disease)     dx elsewheer in past, no documentation. Non-ischemic myovue 2007  . CHF (congestive heart failure)     dx elsewhere, no documentation. Normal EF by previous echo. Trival AS needs f/u ECHO by 2012  . Hypertension   . Sleep apnea, obstructive     at some point used CPAP, was d/c  years ago  . Anemia   . History of thrombocytosis   . Allergic rhinitis   . Edema     R>L leg, u/s 5-12 neg for DVT  . Hemorrhoid   . Type II diabetes mellitus   . Sinus congestion   . Migraine     "once/wk at least" (07/11/2013)  . Shortness of breath   . Myeloproliferative disease   . Ascending aortic aneurysm 03/2014    4.3cm on CT scan    Social History   Social History  . Marital Status: Widowed    Spouse Name: N/A  . Number of Children: 2  . Years of Education: N/A   Occupational History  . retired    .     Social History Main Topics  . Smoking status: Former Smoker -- 0.25 packs/day for 12 years    Types: Cigarettes    Quit date: 05/18/1966  . Smokeless tobacco: Never Used     Comment: quit smoking 45 years ago  . Alcohol Use: No  . Drug Use: No  . Sexual Activity: Yes   Other Topics Concern  . None   Social History Narrative   Moved from [place] 2006   Widow 2007   Son lives with him     ECHO:Study Conclusions--01/21/15  - Left ventricle: Diffuse hypokinesis worse in the inferior wall. Wall thickness was  increased in a pattern of moderate LVH. The estimated ejection fraction was 30%. - Aortic valve: There was trivial regurgitation. - Mitral valve: There was mild regurgitation. - Left atrium: The atrium was mildly dilated. - Right atrium: The atrium was moderately dilated. - Atrial septum: Redundant and mobile atrial septum bows to left indicated high RA pressure No obvious PFO/ASD. - Pulmonary arteries: PA peak pressure: 59 mm Hg (S). - Pericardium, extracardiac: A trivial pericardial effusion was identified posterior to the heart.  Transthoracic echocardiography. M-mode, complete 2D, spectral Doppler, and color Doppler. Birthdate: Patient birthdate: Mar 20, 1941. Age: Patient is 74 yr old. Sex: Gender: male. BMI: 34.5 kg/m^2. Blood pressure:   160/79 Patient status: Inpatient. Study date: Study date: 01/21/2015. Study time: 03:24 PM. Location: Bedside.  BNP    Component Value Date/Time   BNP 518.8* 01/21/2015 0825    ProBNP    Component Value Date/Time   PROBNP 4369.0* 04/14/2014 2335     Education Assessment and Provision:  Detailed education and instructions provided on heart failure disease management including the following:  Signs and symptoms of Heart Failure When to call the physician Importance of daily  weights Low sodium diet Fluid restriction Medication management Anticipated future follow-up appointments  Patient education given on each of the above topics.  Patient acknowledges understanding and acceptance of all instructions.  I spoke briefly with Juan [name] regarding his HF.  He tells me that he has been told in the past that his "heart is good".  He blames his swelling and trouble breathing on stopping two "medications"--Anagredlide and Potassium--and is quite adamant about this.  He does not have a scale and has not been weighing daily.  He tells me that he has never been told to do so.  We spent some time discussing a low sodium diet  and high sodium foods to avoid.  He insists that he "washes sausage and boils it to remove the salt" and asks for all popcorn and foods prepared to be done so "without salt".   I spent some time reiterating that most foods contain sodium and that he should limit daily sodium intake as well as added salt.  I will discuss discharge plan with care manager.  He has very poor insight into his health and was difficult to redirect during conversation.   Education Materials:  "Living Better With Heart Failure" Booklet, Daily Weight Tracker Tool    High Risk Criteria for Readmission and/or Poor Patient Outcomes:   EF <30%- Yes 30%  2 or more admissions in 6 months- No  Difficult social situation- Yes--? Ability to take care of himself  Demonstrates medication noncompliance- was taking medications differently than prescribed on admission   Barriers of Care:  Very poor health literacy, knowledge and compliance  Discharge Planning:   Plans to discharge to home alone with son and grandson visiting regularly.  He will need HHRN for ongoing education, symptom recognition and compliance reinforcement.

## 2015-01-22 NOTE — Progress Notes (Signed)
PROGRESS NOTE  Juan [name] SWF:[id number] DOB: 1940-05-23 DOA: 01/21/2015 PCP: [name], MD  Brief History 74 y.o. male, with dCHF, OSA, HTN, DM2, and myeloproliferative disorder presents with SOB that has been worsening over the last 3 weeks. Juan [name] reports that he lives at home with his son. 3 weeks ago his legs started swelling and he developed a cough with clear sputum. He is unable to lie flat at home and sleeps in his hospital bed with the [facility] elevated. Echocardiogram shows 20% drop in the patient's EF over the past 10 months. Heart failure team was consulted to assist with management. Assessment/Plan: Acute Hypoxic respiratory failure  -secondary to Acute diastolic heart failure  -Patient reports compliance with doctors visits, medications and diet. -Troponins negative 3  -Continue IV lasix. Restart Coreg (stopped due to patient preference?).  -Cont ASA. -Presently stable on 3 L  Acute on chronic systolic and diastolic CHF -22/06/5425 weight 226 pounds -01/22/2015 weight 273 pounds -Continue intravenous furosemide -Daily weights -04/01/2014 echocardiogram EF 50-55% -01/21/2015 echocardiogram EF 30%, PASP-59 -Suspect a degree of obstructive sleep apnea, cor pulmonale Symptomatic Anemia -Hgb dropped from 9.5 -> 7.2 in 3 months.  -Guiac negative. -This is likely due to progression of the patient's myelodysplastic syndrome -01/21/2015 transfusing 1 unit PRBC  Myelodysplastic syndrome -pt has chronic leukocytosis -01/21/2015--1 unit PRBC  -Discussed with Oncology on call will use irradiated blood products  -Discussed with the patient about possible leukemic conversion--pt does not want any diagnostic studies or treatment at this time Bilateral Lower extremity edema -due to CHF  -dopplers-- neg DVT.  -Diurese.  Atypical Chest Pain Troponins negative 3 -EKG negative for concerning ischemic changes  CKD 2-3 -Baseline creatinine 1.1-1.4 -Continue monitor  with diuresis  Ascending thoracic Aorta aneurysm  Last checked in 03/2014. 4.3 cm in diameter.  -needs follow up scan outpatient for monitoring.    Family Communication:   Pt at beside Disposition Plan:   Patient refuses skilled nursing facility, poor insight into medical situation;  Several more days in hospital       Procedures/Studies: Dg Chest 2 View  01/21/2015   CLINICAL DATA:  Chest pain, shortness of breath and cough for 3 weeks. History of CHF, hypertension and diabetes  EXAM: CHEST  2 VIEW  COMPARISON:  06/16/2014; chest CT- 06/21/2014  FINDINGS: Grossly unchanged enlarged cardiac silhouette and mediastinal contours given persistently reduced volumes. Pulmonary vasculature appears indistinct with cephalization of flow. Grossly unchanged bilateral infrahilar heterogeneous opacities, right greater than left. There is persistent moderate elevation of the right hemidiaphragm. No pleural effusion or pneumothorax. No evidence of edema. No acute osseus abnormalities. Re- demonstrated diffuse increased sclerosis of the osseous structures compatible with provided history of myeloproliferative disease.  IMPRESSION: 1. Suspected mild pulmonary edema on this hypoventilated examination. No definite pleural effusions. 2. Persistent moderate elevation of the right hemidiaphragm with associated bilateral infrahilar opacities, likely atelectasis. 3. Unchanged diffuse increased sclerosis of the imaged osseous structures compatible with provided history of myeloproliferative disease.   Electronically Signed   By: Sandi Mariscal M.D.   On: 01/21/2015 08:52         Subjective: Patient states that he is breathing better but still having significant dyspnea with minimal exertion. Denies any chest discomfort, nausea, vomiting, diarrhea, vomiting. No dysuria. No dizziness. Denies any headaches.   Objective: Filed Vitals:   01/22/15 0250 01/22/15 0507 01/22/15 0759 01/22/15 1030  BP: 155/79 139/74  132/80 148/81  Pulse: 90 89 87 92  Temp:  97.9 F (36.6 C) 98.5 F (36.9 C)   TempSrc:  Oral Oral   Resp:  16 20   Height:      Weight:  124.2 kg (273 lb 13 oz)    SpO2: 98% 100% 97%     Intake/Output Summary (Last 24 hours) at 01/22/15 1422 Last data filed at 01/22/15 1024  Gross per 24 hour  Intake    510 ml  Output   3275 ml  Net  -2765 ml   Weight change:  Exam:   General:  Pt is alert, follows commands appropriately, not in acute distress  HEENT: No icterus, No thrush, No neck mass, [place]/AT  Cardiovascular: RRR, S1/S2, no rubs, no gallops  Respiratory: Fine bibasilar crackles. No wheeze.   Abdomen: Soft/+BS, non tender, non distended, no guarding; no hepatosplenomegaly.  Extremities: 4+ LE edema, No lymphangitis, No petechiae, No rashes, no synovitis; no cyanosis or clubbing  Data Reviewed: Basic Metabolic Panel:  Recent Labs Lab 01/21/15 0825 01/22/15 0404  NA 139 140  K 3.8 4.4  CL 109 107  CO2 23 26  GLUCOSE 102* 104*  BUN 23* 22*  CREATININE 1.44* 1.42*  CALCIUM 9.5 9.1   Liver Function Tests:  Recent Labs Lab 01/21/15 0825  AST 48*  ALT 22  ALKPHOS 99  BILITOT 0.5  PROT 6.3*  ALBUMIN 3.2*   No results for input(s): LIPASE, AMYLASE in the last 168 hours. No results for input(s): AMMONIA in the last 168 hours. CBC:  Recent Labs Lab 01/21/15 0825 01/22/15 0114 01/22/15 0404  WBC 15.8* 16.8* 16.9*  HGB 7.2* 8.1* 8.0*  HCT 24.2* 26.7* 26.0*  MCV 81.8 81.4 81.3  PLT 576* 550* 578*   Cardiac Enzymes:  Recent Labs Lab 01/21/15 1434 01/21/15 1807 01/22/15 0114  TROPONINI <0.03 <0.03 <0.03   BNP: Invalid input(s): POCBNP CBG:  Recent Labs Lab 01/21/15 1313 01/21/15 1709 01/21/15 2150 01/22/15 0545 01/22/15 1159  GLUCAP 97 118* 109* 100* 99    No results found for this or any previous visit (from the past 240 hour(s)).   Scheduled Meds: . sodium chloride   Intravenous Once  . antiseptic oral rinse  7 mL Mouth Rinse  BID  . aspirin  325 mg Oral Daily  . feeding supplement (ENSURE ENLIVE)  237 mL Oral BID BM  . fluticasone  2 spray Each Nare Daily  . furosemide  60 mg Intravenous BID  . heparin  5,000 Units Subcutaneous 3 times per day  . insulin aspart  0-15 Units Subcutaneous TID WC  . insulin aspart  0-5 Units Subcutaneous QHS  . metoprolol tartrate  12.5 mg Oral BID  . potassium chloride  40 mEq Oral Daily  . sodium chloride  3 mL Intravenous Q12H   Continuous Infusions:    Teneil Shiller, DO  [facility] Pager [phone]  If 7PM-7AM, please contact night-coverage [email or web address] Password [email or web address] 01/22/2015, 2:22 PM   LOS: 1 day

## 2015-01-22 NOTE — Consult Note (Signed)
Advanced Heart Failure Team Consult Note  Referring Physician: Dr Tat Primary Physician: Dr [name] Primary Cardiologist:  Dr Johnsie Cancel  Reason for Consultation: Diastolic HF/ Worsening EF by echo.  HPI:    Juan [name] is a 74 y.o. male with history of Diastolic HF, OSA, HTN, DM2, and myeloproliferative disorder followed by Oncology.    He last saw Dr. Johnsie Cancel in 12/2013 and was reported to have mild chronic fatigue and exertional dyspnea.  He weighed 257 lb at that time. He was on 120 mg of lasix daily and thought to be stable.  He has not followed up with him since.  He was seen by onc 10/22/14 for his myeloproliferative disorder.  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 His weight at that visit was 226 lbs.  He presented to [facility] on 01/21/15 with worsening bilaterally LE edema, pain in his feet, weakness, and SOB.  He was afebrile. Pulse ox was 87% on RA. He was noted to have JVD and 4+ pitting edema. Weight on admit 277. Pertinent labs in ED include K 3.8, Cr 1.44, BNP 518.8, troponins negative , and WBC 16.9. CXR shows suspected mild pulmonary edema on hypoventilated examination, persistent moderate elevation of the right hemidiaphragm with associated bilateral infrahilar opacities, likely atelectasis and unchanged diffuse increase sclerosis with hx of myeloproliferative disease.  So far he has diuresed 2.3L and down 3 lbs on IV lasix 60 mg BID.   Juan. [name] is a very difficult historian and hard to keep focused on one topic. He is unsure what caused his HF or how long ago he began to have problems. He states there was a change to his potassium and anagrelide in May and that is when his problems began.  He currently lives at home and gets help from his son most days. In May he could walk around a "parking lot" without difficulty, but now he can barely make it to the end of a room before he  gets SOB. + Bendopnea. He has chronic orthopnea and sleeps in a hospital bed.  He does not wear 02 at home. He has occasional atypical chest pain with no clear aggravating/relieving factors. He has never had a heart attack.  He states he has been taking all of his medication as directed.  Lasix makes him pee a lot.  He has been taking 80 mg daily. He says he does not eat any salt but drink 3-4 bottles of water daily + tea and diet mountain dew.  He does not want to drink any less because his throat gets dry.  He does not use CPAP.  Echo 04/01/14 EF 50-55%, Moderate LVH, Normal RV Echo 01/21/15 EF 30%, moderate LVH, mild MR, PA peak pressure 59 mm Hg, Normal RV    Review of Systems: [y] = yes, '[ ]'$  = no   General: Weight gain [y]; Weight loss '[ ]'$ ; Anorexia '[ ]'$ ; Fatigue '[ ]'$ ; Fever '[ ]'$ ; Chills '[ ]'$ ; Weakness [y]  Cardiac: Chest pain/pressure [y]; Resting SOB '[ ]'$ ; Exertional SOB [y]; Orthopnea '[ ]'$ ; Pedal Edema [y]; Palpitations '[ ]'$ ; Syncope '[ ]'$ ; Presyncope '[ ]'$ ; Paroxysmal nocturnal dyspnea'[ ]'$   Pulmonary: Cough '[ ]'$ ; Wheezing'[ ]'$ ; Hemoptysis'[ ]'$ ; Sputum '[ ]'$ ; Snoring '[ ]'$   GI: Vomiting'[ ]'$ ; Dysphagia'[ ]'$ ; Melena'[ ]'$ ; Hematochezia '[ ]'$ ; Heartburn'[ ]'$ ; Abdominal pain '[ ]'$ ; Constipation '[ ]'$ ; Diarrhea '[ ]'$ ; BRBPR '[ ]'$   GU: Hematuria'[ ]'$ ; Dysuria '[ ]'$ ; Nocturia'[ ]'$   Vascular: Pain in legs with walking '[ ]'$ ; Pain in feet with lying flat '[ ]'$ ; Non-healing sores '[ ]'$ ; Stroke '[ ]'$ ; TIA '[ ]'$ ; Slurred speech '[ ]'$ ;  Neuro: Headaches'[ ]'$ ; Vertigo'[ ]'$ ; Seizures'[ ]'$ ; Paresthesias'[ ]'$ ;Blurred vision '[ ]'$ ; Diplopia '[ ]'$ ; Vision changes '[ ]'$   Ortho/Skin: Arthritis [y]; Joint pain [y]; Muscle pain '[ ]'$ ; Joint swelling '[ ]'$ ; Back Pain '[ ]'$ ; Rash '[ ]'$   Psych: Depression'[ ]'$ ; Anxiety'[ ]'$   Heme: Bleeding problems '[ ]'$ ; Clotting disorders '[ ]'$ ; Anemia '[ ]'$   Endocrine: Diabetes '[ ]'$ ; Thyroid dysfunction'[ ]'$   Home Medications Prior to Admission medications   Medication Sig Start Date End Date Taking? Authorizing Provider  acetaminophen (TYLENOL) 500 MG tablet Take  500 mg by mouth every 6 (six) hours as needed for moderate pain or headache.    Yes Historical Provider, MD  Ascorbic Acid (VITAMIN C PO) Take 1 tablet by mouth daily.   Yes Historical Provider, MD  aspirin 325 MG tablet Take 1 tablet (325 mg total) by mouth daily. 04/16/14  Yes Ripudeep K Rai, MD  b complex vitamins capsule Take 1 capsule by mouth daily. 01/12/13  Yes Irene Pap, NP  feeding supplement, GLUCERNA SHAKE, (GLUCERNA SHAKE) LIQD Take 237 mLs by mouth 2 (two) times daily between meals. Patient taking differently: Take 237 mLs by mouth as needed (for protein).  04/10/14  Yes Barton Dubois, MD  Ferrous Gluconate (IRON) 240 (27 FE) MG TABS Take 1 tablet by mouth daily.   Yes Historical Provider, MD  fluticasone (FLONASE) 50 MCG/ACT nasal spray Place 2 sprays into both nostrils daily.    Yes Historical Provider, MD  furosemide (LASIX) 40 MG tablet Take 1 tablet (40 mg total) by mouth 2 (two) times daily. Patient taking differently: Take 40 mg by mouth daily.  10/18/14  Yes Colon Branch, MD  gabapentin (NEURONTIN) 100 MG capsule Take 1 capsule (100 mg total) by mouth 3 (three) times daily. Patient taking differently: Take 100 mg by mouth 3 (three) times daily as needed (for pain).  06/20/14  Yes Debbe Odea, MD  hydrocerin (EUCERIN) CREA Apply Eucerin cream to BLE Q day after bathing and roughly towel drying to remove loose skin 01/27/14  Yes Shanker Kristeen Mans, MD  HYDROcodone-acetaminophen (NORCO/VICODIN) 5-325 MG per tablet Take 1 tablet by mouth every 6 (six) hours as needed for moderate pain. 10/18/14  Yes Colon Branch, MD  senna-docusate (SENOKOT-S) 8.6-50 MG per tablet Take 1 tablet by mouth at bedtime. Patient taking differently: Take 1 tablet by mouth at bedtime as needed for moderate constipation.  06/20/14  Yes Debbe Odea, MD  metoprolol tartrate (LOPRESSOR) 25 MG tablet Take 1 tablet (25 mg total) by mouth 2 (two) times daily. Patient not taking: Reported on 01/21/2015 10/09/14   Colon Branch,  MD  sertraline (ZOLOFT) 50 MG tablet Take 1 tablet (50 mg total) by mouth daily. Patient not taking: Reported on 01/21/2015 10/09/14   Colon Branch, MD    Past Medical History: Past Medical History  Diagnosis Date  . CAD (coronary artery disease)     dx elsewheer in past, no documentation. Non-ischemic myovue 2007  . CHF (congestive heart failure)     dx elsewhere, no documentation. Normal EF by previous echo. Trival AS needs f/u ECHO by 2012  . Hypertension   . Sleep apnea, obstructive     at some point used CPAP, was d/c  years ago  . Anemia   . History of thrombocytosis   . Allergic rhinitis   . Edema     R>L leg, u/s 5-12 neg for DVT  . Hemorrhoid   . Type II diabetes mellitus   . Sinus congestion   . Migraine     "once/wk at least" (07/11/2013)  . Shortness of breath   . Myeloproliferative disease   . Ascending aortic aneurysm 03/2014    4.3cm on CT scan    Past Surgical History: Past Surgical History  Procedure Laterality Date  . Toe surgery Right     "tried to straighten out big toe" (07/11/2013)    Family History: Family History  Problem Relation Age of Onset  . Colon cancer Neg Hx   . Prostate cancer Neg Hx   . Heart attack Neg Hx   . Diabetes Neg Hx   . Schizophrenia Son     Social History: Social History   Social History  . Marital Status: Widowed    Spouse Name: N/A  . Number of Children: 2  . Years of Education: N/A   Occupational History  . retired    .     Social History Main Topics  . Smoking status: Former Smoker -- 0.25 packs/day for 12 years    Types: Cigarettes    Quit date: 05/18/1966  . Smokeless tobacco: Never Used     Comment: quit smoking 45 years ago  . Alcohol Use: No  . Drug Use: No  . Sexual Activity: Yes   Other Topics Concern  . None   Social History Narrative   Moved from [place] 2006   Widow 2007   Son lives with him     Allergies:  No Known Allergies  Objective:    Vital Signs:   Temp:  [97.5 F (36.4 C)-99.1  F (37.3 C)] 99.1 F (37.3 C) (09/06 1439) Pulse Rate:  [87-99] 87 (09/06 1439) Resp:  [16-24] 24 (09/06 1439) BP: (132-155)/(74-98) 140/77 mmHg (09/06 1439) SpO2:  [97 %-100 %] 98 % (09/06 1439) FiO2 (%):  [2.5 %] 2.5 % (09/06 1439) Weight:  [273 lb 13 oz (124.2 kg)] 273 lb 13 oz (124.2 kg) (09/06 0507) Last BM Date: 01/21/15  Weight change: Filed Weights   01/21/15 1128 01/21/15 1204 01/22/15 0507  Weight: 277 lb 8 oz (125.873 kg) 276 lb 10.8 oz (125.5 kg) 273 lb 13 oz (124.2 kg)    Intake/Output:   Intake/Output Summary (Last 24 hours) at 01/22/15 1506 Last data filed at 01/22/15 1446  Gross per 24 hour  Intake    960 ml  Output   2775 ml  Net  -1815 ml     Physical Exam: General:  Chronically ill and elderly appearing.  HEENT: normal, 02 via [place] Neck: supple. JVP to jaw. Carotids 2+ bilat; no bruits. No lymphadenopathy or thryomegaly noted. Cor: PMI nondisplaced. Regular rate & rhythm. No rubs, gallops or murmurs appreciated Lungs: Diminished bibasilarly. No crackles appreciated Abdomen: Obese, soft, nontender, nondistended. No hepatosplenomegaly. No bruits or masses. Good bowel sounds. Extremities: no cyanosis, clubbing, rash. Gross 3-4+ edema into thighs. Chronic venous stasis. Neuro: alert & orientedx3, cranial nerves grossly intact. moves all 4 extremities w/o difficulty. Affect pleasant  Telemetry: NSR 80s  Labs: Basic Metabolic Panel:  Recent Labs Lab 01/21/15 0825 01/22/15 0404  NA 139 140  K 3.8 4.4  CL 109 107  CO2 23 26  GLUCOSE 102* 104*  BUN 23* 22*  CREATININE 1.44* 1.42*  CALCIUM 9.5 9.1    Liver Function Tests:  Recent Labs Lab 01/21/15 0825  AST 48*  ALT 22  ALKPHOS 99  BILITOT 0.5  PROT 6.3*  ALBUMIN 3.2*   No results for input(s): LIPASE, AMYLASE in the last 168 hours. No results for input(s): AMMONIA in the last 168 hours.  CBC:  Recent Labs Lab 01/21/15 0825 01/22/15 0114 01/22/15 0404  WBC 15.8* 16.8* 16.9*  HGB  7.2* 8.1* 8.0*  HCT 24.2* 26.7* 26.0*  MCV 81.8 81.4 81.3  PLT 576* 550* 578*    Cardiac Enzymes:  Recent Labs Lab 01/21/15 1434 01/21/15 1807 01/22/15 0114  TROPONINI <0.03 <0.03 <0.03    BNP: BNP (last 3 results)  Recent Labs  06/16/14 1940 06/20/14 0538 01/21/15 0825  BNP 18.8 227.8* 518.8*    ProBNP (last 3 results)  Recent Labs  01/25/14 1115 03/29/14 1549 04/14/14 2335  PROBNP 828.2* 6628.0* 4369.0*     CBG:  Recent Labs Lab 01/21/15 1313 01/21/15 1709 01/21/15 2150 01/22/15 0545 01/22/15 1159  GLUCAP 97 118* 109* 100* 99    Coagulation Studies: No results for input(s): LABPROT, INR in the last 72 hours.  Other results: EKG: NSR 90s, prolonged PR interval  Imaging: Dg Chest 2 View  01/21/2015   CLINICAL DATA:  Chest pain, shortness of breath and cough for 3 weeks. History of CHF, hypertension and diabetes  EXAM: CHEST  2 VIEW  COMPARISON:  06/16/2014; chest CT- 06/21/2014  FINDINGS: Grossly unchanged enlarged cardiac silhouette and mediastinal contours given persistently reduced volumes. Pulmonary vasculature appears indistinct with cephalization of flow. Grossly unchanged bilateral infrahilar heterogeneous opacities, right greater than left. There is persistent moderate elevation of the right hemidiaphragm. No pleural effusion or pneumothorax. No evidence of edema. No acute osseus abnormalities. Re- demonstrated diffuse increased sclerosis of the osseous structures compatible with provided history of myeloproliferative disease.  IMPRESSION: 1. Suspected mild pulmonary edema on this hypoventilated examination. No definite pleural effusions. 2. Persistent moderate elevation of the right hemidiaphragm with associated bilateral infrahilar opacities, likely atelectasis. 3. Unchanged diffuse increased sclerosis of the imaged osseous structures compatible with provided history of myeloproliferative disease.   Electronically Signed   By: Sandi Mariscal M.D.   On:  01/21/2015 08:52      Medications:     Current Medications: . sodium chloride   Intravenous Once  . antiseptic oral rinse  7 mL Mouth Rinse BID  . aspirin  325 mg Oral Daily  . feeding supplement (ENSURE ENLIVE)  237 mL Oral BID BM  . fluticasone  2 spray Each Nare Daily  . furosemide  60 mg Intravenous BID  . heparin  5,000 Units Subcutaneous 3 times per day  . insulin aspart  0-15 Units Subcutaneous TID WC  . insulin aspart  0-5 Units Subcutaneous QHS  . metoprolol tartrate  12.5 mg Oral BID  . potassium chloride  40 mEq Oral Daily  . sodium chloride  3 mL Intravenous Q12H     Infusions:      Assessment/Plan   1. Acute on Chronic diastolic CHF, EF 74% echo 01/21/15, NYHA class III - Normal myoview 2007 2. OSA 3. CKD stage III - Baseline 1.4 4. HTN 5. DM2 6. 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  He has intermittently taken K despite  orders as he claims he was once told in [place] to "never stop it."  He is grossly volume overloaded on exam with JVP to his jaw and 3-4+ edema up into his thighs.   With comorbidities will treat conservatively for now using 80 mg IV lasix BID with 2.5 mg metolazone daily.  Education will be a major issue with pt as he does not seem to understand the majority of his medications or medical problems. Will focus on education here, but am unsure home by himself with occasional help from family is best situation for him.  Will consult SW to see if he would consider SNF placement.  PT recommends SNF.  He has been seen by palliative care in the past concerning his myeloproliferative disorder.  May benefit from further conversation in light of recent oncology notes ref  #6 , but doubt that pt would be receptive to this.    Length of Stay:  1  Shirley Friar PA-C 01/22/2015, 3:06 PM  Advanced Heart Failure Team Pager [phone] (M-F; 7a - 4p)  Please contact [facility] for night-coverage after hours (4p -7a ) and weekends on [email or web address]  Patient seen and examined with Oda Kilts, PA-C. We discussed all aspects of the encounter. I agree with the assessment and plan as stated above.   He has massive volume overload with R>L symptoms in setting with noncompliance with dietary restrictions. EF also noted to be newly reduced. Agree with IV diuresis and add metolazone. Ideally would proceed with R/L cath once diuresed but given concern for transforming myeloproliferative disorder we will treat medically for now. Will start spiro. Can titrate on b-blocker and ACE as tolerated. He has very poor insight into his HF.   Layton Tappan,MD 5:51 PM

## 2015-01-22 NOTE — Procedures (Signed)
Pt does not wish to wear cpap, will inform RT if anything changes. RT will continue to monitor.

## 2015-01-22 NOTE — Progress Notes (Signed)
Initial Nutrition Assessment  DOCUMENTATION CODES:   Obesity unspecified  INTERVENTION:   Provide Ensure Enlive po BID, each supplement provides 350 kcal and 20 grams of protein.  Encourage adequate PO intake.   NUTRITION DIAGNOSIS:   Inadequate oral intake related to poor appetite as evidenced by  (varied meal completion 50-75%).  GOAL:   Patient will meet greater than or equal to 90% of their needs  MONITOR:   PO intake, Supplement acceptance, Weight trends, Labs, I & O's  REASON FOR ASSESSMENT:   Consult Diet education  ASSESSMENT:   74 y.o. male, with DHF, OSA, HTN, DM2, and myeloproliferative disorder presents with SOB that has been worsening over the last 3 weeks.  Pt reports having a varied appetite, he reports he will eat well on days and on other days have poor po. Current meal completion has been 50-75%. Pt reports usual body weight of ~240 lbs. Of note, pt with +3 LE edema. Pt reports he consumes Glucerna at home on occasion. RD to order nutritional supplements to aid in caloric and protein needs.   Nutrition-Focused physical exam completed. Findings are no fat depletion, moderate muscle depletion, and severe edema.   Labs and medications reviewed.   Diet Order:  Diet Heart Room service appropriate?: Yes; Fluid consistency:: Thin; Fluid restriction:: 1800 mL Fluid  Skin:   (+3 LE edema)  Last BM:  9/5  Height:   Ht Readings from Last 1 Encounters:  01/21/15 '6\' 3"'$  (1.905 m)    Weight:   Wt Readings from Last 1 Encounters:  01/22/15 273 lb 13 oz (124.2 kg)    Ideal Body Weight:  89 kg  BMI:  Body mass index is 34.22 kg/(m^2).  Estimated Nutritional Needs:   Kcal:  6387-5643  Protein:  125-140 grams  Fluid:  1.8 L/day  EDUCATION NEEDS:   No education needs identified at this time  Corrin Parker, MS, RD, LDN Pager # [phone] After hours/ weekend pager # [phone]

## 2015-01-22 NOTE — Plan of Care (Signed)
Problem: Food- and Nutrition-Related Knowledge Deficit (NB-1.1) Goal: Nutrition education Formal process to instruct or train a patient/client in a skill or to impart knowledge to help patients/clients voluntarily manage or modify food choices and eating behavior to maintain or improve health. Outcome: Completed/Met Date Met:  01/22/15 Nutrition Education Note  RD consulted for nutrition education regarding a low sodium diet.  RD provided "Low Sodium Nutrition Therapy" handout from the Academy of [employer]etics. Reviewed patient's dietary recall. Provided examples on ways to decrease sodium intake in diet. Discouraged intake of processed foods and use of salt shaker. Encouraged fresh fruits and vegetables as well as whole grain sources of carbohydrates to maximize fiber intake.   RD discussed why it is important for patient to adhere to diet recommendations, and emphasized the role of fluids, and  foods to avoid. Teach back method used.  Expect good compliance.  Corrin Parker, MS, RD, LDN Pager # [phone] After hours/ weekend pager # [phone]

## 2015-01-23 LAB — GLUCOSE, CAPILLARY
GLUCOSE-CAPILLARY: 107 mg/dL — AB (ref 65–99)
GLUCOSE-CAPILLARY: 115 mg/dL — AB (ref 65–99)
GLUCOSE-CAPILLARY: 107 mg/dL — AB (ref 65–99)
Glucose-Capillary: 117 mg/dL — ABNORMAL HIGH (ref 65–99)

## 2015-01-23 LAB — BASIC METABOLIC PANEL
Anion gap: 8 (ref 5–15)
BUN: 20 mg/dL (ref 6–20)
CHLORIDE: 106 mmol/L (ref 101–111)
CO2: 26 mmol/L (ref 22–32)
CREATININE: 1.44 mg/dL — AB (ref 0.61–1.24)
Calcium: 9.1 mg/dL (ref 8.9–10.3)
GFR calc Af Amer: 54 mL/min — ABNORMAL LOW (ref >60)
GFR calc non Af Amer: 47 mL/min — ABNORMAL LOW (ref >60)
GLUCOSE: 100 mg/dL — AB (ref 65–99)
POTASSIUM: 4.4 mmol/L (ref 3.5–5.1)
Sodium: 140 mmol/L (ref 135–145)

## 2015-01-23 LAB — MAGNESIUM: Magnesium: 2.1 mg/dL (ref 1.7–2.4)

## 2015-01-23 MED ORDER — LISINOPRIL 2.5 MG PO TABS
2.5000 mg | ORAL_TABLET | Freq: Two times a day (BID) | ORAL | Status: DC
  Administered 2015-01-23 (×2): 2.5 mg via ORAL
  Filled 2015-01-23 (×2): qty 1

## 2015-01-23 NOTE — Progress Notes (Signed)
Advanced Heart Failure Rounding Note  Primary Physician: Dr [name] Primary Cardiologist: Dr Johnsie Cancel HF: Bensimhon  Subjective:    Says his breathing has improved a lot, but still has some room to go.  Swelling is coming down as well.  Says his son is at home with him most of the time and he gets around [place] when his fluid is down.  He talked a lot about the importance of weighing himself daily once he gets home and known when to come back to the hospital.  Out 3.7 L and down 9 lbs with 80 mg IV lasix BID and 2.5 mg metolazone. Cr stable.  Objective:   Weight Range: 265 lb 14.4 oz (120.611 kg) Body mass index is 33.24 kg/(m^2).   Vital Signs:   Temp:  [98.6 F (37 C)-99.5 F (37.5 C)] 99.3 F (37.4 C) (09/07 0717) Pulse Rate:  [82-92] 82 (09/07 0717) Resp:  [18-24] 18 (09/07 0717) BP: (124-148)/(65-86) 124/65 mmHg (09/07 0717) SpO2:  [96 %-100 %] 96 % (09/07 0717) FiO2 (%):  [2.5 %] 2.5 % (09/06 1439) Weight:  [265 lb 14.4 oz (120.611 kg)] 265 lb 14.4 oz (120.611 kg) (09/07 0541) Last BM Date: 01/23/15  Weight change: Filed Weights   01/21/15 1204 01/22/15 0507 01/23/15 0541  Weight: 276 lb 10.8 oz (125.5 kg) 273 lb 13 oz (124.2 kg) 265 lb 14.4 oz (120.611 kg)    Intake/Output:   Intake/Output Summary (Last 24 hours) at 01/23/15 0858 Last data filed at 01/23/15 0523  Gross per 24 hour  Intake   1440 ml  Output   5225 ml  Net  -3785 ml     Physical Exam: General: Chronically ill and elderly appearing. Sitting in chair HEENT: normal, 02 via [place] Neck: supple. JVP 14 cm+. Carotids 2+ bilat; no bruits. No lymphadenopathy or thryomegaly . Cor: PMI nondisplaced. Regular rate & rhythm. No rubs, gallops or murmurs noted. Lungs: Diminished bilateral bases Abdomen: Obese, soft, NT, ND. No hepatosplenomegaly. No bruits or masses. +BS Extremities: no cyanosis, clubbing, rash. Gross 3+ edema to thighs. Chronic venous stasis. Neuro: alert & orientedx3, cranial nerves grossly  intact. moves all 4 extremities w/o difficulty. Affect pleasant  Telemetry: NSR 80s, PVCs  Labs: CBC  Recent Labs  01/22/15 0114 01/22/15 0404  WBC 16.8* 16.9*  HGB 8.1* 8.0*  HCT 26.7* 26.0*  MCV 81.4 81.3  PLT 550* 601*   Basic Metabolic Panel  Recent Labs  01/22/15 0404 01/23/15 0421  NA 140 140  K 4.4 4.4  CL 107 106  CO2 26 26  GLUCOSE 104* 100*  BUN 22* 20  CALCIUM 9.1 9.1   Liver Function Tests  Recent Labs  01/21/15 0825  AST 48*  ALT 22  ALKPHOS 99  BILITOT 0.5  PROT 6.3*  ALBUMIN 3.2*   No results for input(s): LIPASE, AMYLASE in the last 72 hours. Cardiac Enzymes  Recent Labs  01/21/15 1434 01/21/15 1807 01/22/15 0114  TROPONINI <0.03 <0.03 <0.03    BNP: BNP (last 3 results)  Recent Labs  06/16/14 1940 06/20/14 0538 01/21/15 0825  BNP 18.8 227.8* 518.8*    ProBNP (last 3 results)  Recent Labs  01/25/14 1115 03/29/14 1549 04/14/14 2335  PROBNP 828.2* 6628.0* 4369.0*     D-Dimer No results for input(s): DDIMER in the last 72 hours. Hemoglobin A1C No results for input(s): HGBA1C in the last 72 hours. Fasting Lipid Panel No results for input(s): CHOL, HDL, LDLCALC, TRIG, CHOLHDL, LDLDIRECT in the last  72 hours. Thyroid Function Tests No results for input(s): TSH, T4TOTAL, T3FREE, THYROIDAB in the last 72 hours.  Invalid input(s): FREET3  Other results:     Imaging/Studies:   No results found.  Latest Echo  Latest Cath   Medications:     Scheduled Medications: . sodium chloride   Intravenous Once  . antiseptic oral rinse  7 mL Mouth Rinse BID  . aspirin  325 mg Oral Daily  . feeding supplement (ENSURE ENLIVE)  237 mL Oral BID BM  . fluticasone  2 spray Each Nare Daily  . furosemide  80 mg Intravenous BID  . heparin  5,000 Units Subcutaneous 3 times per day  . insulin aspart  0-15 Units Subcutaneous TID WC  . insulin aspart  0-5 Units Subcutaneous QHS  . metolazone  2.5 mg Oral Daily  . metoprolol  tartrate  12.5 mg Oral BID  . potassium chloride  40 mEq Oral Daily  . sodium chloride  3 mL Intravenous Q12H     Infusions:     PRN Medications:  sodium chloride, acetaminophen, guaiFENesin-dextromethorphan, HYDROcodone-acetaminophen, ondansetron (ZOFRAN) IV, senna-docusate, sodium chloride   Assessment/Plan   1. Acute on Chronic diastolic CHF, EF 09% echo 01/21/15, NYHA class III - Normal myoview 2007 2. OSA 3. CKD stage III - Baseline 1.4 4. HTN 5. DM2 6. 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 Continue 80 mg IV lasix BID with metolazone. Continue to monitor cr closely.  Lisinopril was held during an admission in 11/15 ? AKI.  Will consider adding back once closer to dry weight.  Consider switch to toprol xl from lopressor.  Education and improving his insight into his health problems will likely continue to be an issue.  Will need close follow up in HF clinic. May be a good candidate for paramedicine program.    Dispo: Continue diuresing. With large amount of volume overload, would likely be early next week for euvolemia.   PT recommends SNF, but pt has said he will refuse.  Pt needs 24 hr supervision, as is high fall risk.    Length of Stay: 2  Shirley Friar PA-C 01/23/2015, 8:58 AM  Advanced Heart Failure Team Pager [phone] (M-F; 7a - 4p)  Please contact [facility] for night-coverage after hours (4p -7a ) and weekends on [email or web address]  Patient seen and examined with Oda Kilts, PA-C. We discussed all aspects of the encounter. I agree with the assessment and plan as stated above.   Diuresing well. Continue current regimen. With EF down have suggested R/L cath once diuresed but he refuses and given co-morbidities and lack of angina, I agree with conservative approach. He has very poor insight into his HF and is at very high risk for readmission.   Bensimhon,  Daniel,MD 11:17 PM

## 2015-01-23 NOTE — Progress Notes (Signed)
PROGRESS NOTE  Juan [name] ION:[id number] DOB: [birth year] DOA: 01/21/2015 PCP: [name], MD  Brief History  74 y.o. male, with dCHF, OSA, HTN, DM2, and myeloproliferative disorder presents with SOB that has been worsening over the last 3 weeks. Juan [name] reports that he lives at home with his son. 3 weeks ago his legs started swelling and he developed a cough with clear sputum. He is unable to lie flat at home and sleeps in his hospital bed with the [facility] elevated. Echocardiogram shows 20% drop in the patient's EF over the past 10 months. Heart failure team was consulted to assist with management.   Subjective: Patient in chair, no headache or fevers. No chest pain. No shortness of breath. No abdominal pain or discomfort. No focal weakness. His swelling is coming down.   Assessment/Plan:  Acute Hypoxic respiratory failure, acute on chronic systolic and diastolic heart failure last EF 30% on echogram this admission.  -Continue being diuresed with IV Lasix, cardiology on board, still requiring oxygen, his dry weight is 226 pounds and he is still close to 40 pounds above his dry weight. Monitor intake output, daily weight, continue diuresis. Continue beta blocker and ACE inhibitor. Cardiology on board.   Pulmonary hypertension. Could be due to left heart failure. Pulmonary artery pressure 59. Also could be element of sleep apnea. -Plan as above. Outpatient follow-up with pulmonary and cardiology post discharge.  Symptomatic Anemia -Hgb dropped from 9.5 -> 7.2 in 3 months.Guiac negative.This is likely due to progression of the patient's myelodysplastic syndrome, see 1 unit of packed RBC on 01/21/2015. H&H stable.   Myelodysplastic syndrome -pt has chronic leukocytosis -01/21/2015--1 unit PRBC  -Discussed with Oncology on call will use irradiated blood products  -Discussed with the patient about possible leukemic conversion--pt does not want any diagnostic studies or treatment at this  time. -Out Patient oncology follow-up.  Bilateral Lower extremity edema -due to CHF  -dopplers-- neg DVT.  -Diurese.  Atypical Chest Pain Troponins negative 3 -EKG negative for concerning ischemic changes  CKD 2-3 -Baseline creatinine 1.1-1.4 -Continue monitor with diuresis  Ascending thoracic Aorta aneurysm  Last checked in 03/2014. 4.3 cm in diameter.  -needs follow up scan outpatient for monitoring.    Family Communication:   Pt at beside Disposition Plan: Patient refuses skilled nursing facility, poor insight into medical situation;  Several more days in hospital DVT prophylaxis. Heparin  Procedures/Studies: Dg Chest 2 View  01/21/2015   CLINICAL DATA:  Chest pain, shortness of breath and cough for 3 weeks. History of CHF, hypertension and diabetes  EXAM: CHEST  2 VIEW  COMPARISON:  06/16/2014; chest CT- 06/21/2014  FINDINGS: Grossly unchanged enlarged cardiac silhouette and mediastinal contours given persistently reduced volumes. Pulmonary vasculature appears indistinct with cephalization of flow. Grossly unchanged bilateral infrahilar heterogeneous opacities, right greater than left. There is persistent moderate elevation of the right hemidiaphragm. No pleural effusion or pneumothorax. No evidence of edema. No acute osseus abnormalities. Re- demonstrated diffuse increased sclerosis of the osseous structures compatible with provided history of myeloproliferative disease.  IMPRESSION: 1. Suspected mild pulmonary edema on this hypoventilated examination. No definite pleural effusions. 2. Persistent moderate elevation of the right hemidiaphragm with associated bilateral infrahilar opacities, likely atelectasis. 3. Unchanged diffuse increased sclerosis of the imaged osseous structures compatible with provided history of myeloproliferative disease.   Electronically Signed   By: Sandi Mariscal M.D.   On: 01/21/2015 08:52  Objective: Filed Vitals:   01/23/15 0516 01/23/15 0541  01/23/15 0717 01/23/15 1019  BP: 127/69  124/65 129/73  Pulse: 87  82 70  Temp: 99.5 F (37.5 C)  99.3 F (37.4 C) 99.5 F (37.5 C)  TempSrc: Oral  Oral Oral  Resp: '20  18 20  '$ Height:      Weight:  120.611 kg (265 lb 14.4 oz)    SpO2: 96%  96% 95%    Intake/Output Summary (Last 24 hours) at 01/23/15 1149 Last data filed at 01/23/15 1103  Gross per 24 hour  Intake   1440 ml  Output   4425 ml  Net  -2985 ml   Weight change: 9.48 kg (20 lb 14.4 oz) Exam:   General:  Pt is alert, follows commands appropriately, not in acute distress  HEENT: No icterus, No thrush, No neck mass, [place]/AT  Cardiovascular: RRR, S1/S2, no rubs, no gallops  Respiratory: Fine bibasilar crackles. No wheeze.   Abdomen: Soft/+BS, non tender, non distended, no guarding; no hepatosplenomegaly.  Extremities:  3+ LE edema, No lymphangitis, No petechiae, No rashes, no synovitis; no cyanosis or clubbing  Data Reviewed: Basic Metabolic Panel:  Recent Labs Lab 01/21/15 0825 01/22/15 0404 01/23/15 0421  NA 139 140 140  K 3.8 4.4 4.4  CL 109 107 106  CO2 '23 26 26  '$ GLUCOSE 102* 104* 100*  BUN 23* 22* 20  CREATININE 1.44* 1.42* 1.44*  CALCIUM 9.5 9.1 9.1  MG  --   --  2.1   Liver Function Tests:  Recent Labs Lab 01/21/15 0825  AST 48*  ALT 22  ALKPHOS 99  BILITOT 0.5  PROT 6.3*  ALBUMIN 3.2*   No results for input(s): LIPASE, AMYLASE in the last 168 hours. No results for input(s): AMMONIA in the last 168 hours. CBC:  Recent Labs Lab 01/21/15 0825 01/22/15 0114 01/22/15 0404  WBC 15.8* 16.8* 16.9*  HGB 7.2* 8.1* 8.0*  HCT 24.2* 26.7* 26.0*  MCV 81.8 81.4 81.3  PLT 576* 550* 578*   Cardiac Enzymes:  Recent Labs Lab 01/21/15 1434 01/21/15 1807 01/22/15 0114  TROPONINI <0.03 <0.03 <0.03   BNP: Invalid input(s): POCBNP CBG:  Recent Labs Lab 01/21/15 2150 01/22/15 0545 01/22/15 1159 01/22/15 2114 01/23/15 0539  GLUCAP 109* 100* 99 102* 107*    No results found for  this or any previous visit (from the past 240 hour(s)).   Scheduled Meds: . sodium chloride   Intravenous Once  . antiseptic oral rinse  7 mL Mouth Rinse BID  . aspirin  325 mg Oral Daily  . feeding supplement (ENSURE ENLIVE)  237 mL Oral BID BM  . fluticasone  2 spray Each Nare Daily  . furosemide  80 mg Intravenous BID  . heparin  5,000 Units Subcutaneous 3 times per day  . insulin aspart  0-15 Units Subcutaneous TID WC  . insulin aspart  0-5 Units Subcutaneous QHS  . lisinopril  2.5 mg Oral BID  . metolazone  2.5 mg Oral Daily  . metoprolol tartrate  12.5 mg Oral BID  . potassium chloride  40 mEq Oral Daily  . sodium chloride  3 mL Intravenous Q12H   Continuous Infusions:    Thurnell Lose M.D on 01/23/2015 at 11:49 AM  Between 7am to 7pm - Pager - [phone], After 7pm go to [email or web address] - password [email or web address]  [phone]   LOS: 2 days

## 2015-01-23 NOTE — Progress Notes (Signed)
OT Cancellation Note  Patient Details Name: Juan [name] MRN: [id number] DOB: January 13, 1941   Cancelled Treatment:    Reason Eval/Treat Not Completed: Fatigue/lethargy limiting ability to participate ASked for therapist to return tomorrow.  [place], OTR/L  [phone] 01/23/2015 01/23/2015, 4:59 PM

## 2015-01-24 DIAGNOSIS — I5031 Acute diastolic (congestive) heart failure: Secondary | ICD-10-CM

## 2015-01-24 LAB — GLUCOSE, CAPILLARY
GLUCOSE-CAPILLARY: 102 mg/dL — AB (ref 65–99)
GLUCOSE-CAPILLARY: 118 mg/dL — AB (ref 65–99)
Glucose-Capillary: 107 mg/dL — ABNORMAL HIGH (ref 65–99)
Glucose-Capillary: 113 mg/dL — ABNORMAL HIGH (ref 65–99)

## 2015-01-24 LAB — BASIC METABOLIC PANEL
ANION GAP: 8 (ref 5–15)
BUN: 22 mg/dL — ABNORMAL HIGH (ref 6–20)
CALCIUM: 9 mg/dL (ref 8.9–10.3)
CO2: 28 mmol/L (ref 22–32)
CREATININE: 1.51 mg/dL — AB (ref 0.61–1.24)
Chloride: 102 mmol/L (ref 101–111)
GFR calc Af Amer: 51 mL/min — ABNORMAL LOW (ref >60)
GFR, EST NON AFRICAN AMERICAN: 44 mL/min — AB (ref >60)
GLUCOSE: 108 mg/dL — AB (ref 65–99)
Potassium: 4.9 mmol/L (ref 3.5–5.1)
Sodium: 138 mmol/L (ref 135–145)

## 2015-01-24 LAB — MAGNESIUM: Magnesium: 2.1 mg/dL (ref 1.7–2.4)

## 2015-01-24 MED ORDER — GERHARDT'S BUTT CREAM
TOPICAL_CREAM | Freq: Three times a day (TID) | CUTANEOUS | Status: DC
  Administered 2015-01-24 (×3): via TOPICAL
  Administered 2015-01-25: 1 via TOPICAL
  Administered 2015-01-25 – 2015-01-29 (×8): via TOPICAL
  Filled 2015-01-24 (×2): qty 1

## 2015-01-24 MED ORDER — LISINOPRIL 2.5 MG PO TABS
2.5000 mg | ORAL_TABLET | Freq: Every day | ORAL | Status: DC
  Administered 2015-01-25: 2.5 mg via ORAL
  Filled 2015-01-24 (×2): qty 1

## 2015-01-24 MED ORDER — LISINOPRIL 2.5 MG PO TABS: 2.5000 mg | ORAL_TABLET | Freq: Every day | ORAL | Status: DC

## 2015-01-24 NOTE — Care Management Important Message (Signed)
Important Message  Patient Details  Name: Juan [name] MRN: [id number] Date of Birth: 15-Dec-1940   Medicare Important Message Given:  Yes-second notification given    Delorse Lek 01/24/2015, 1:43 PM

## 2015-01-24 NOTE — Progress Notes (Signed)
Physical Therapy Treatment Patient Details Name: Juan [name] MRN: [id number] DOB: 05/29/1940 Today's Date: 01/24/2015    History of Present Illness Juan [name] is a 74 y.o. male, with DHF, OSA, HTN, DM2, and myeloproliferative disorder presents with SOB that has been worsening over the last 3 weeks. Mr. Meddaugh reports that he lives at home with his son. 3 weeks ago his legs started swelling and he developed a cough with clear sputum. He is unable to lie flat at home and sleeps in his hospital bed with the [facility] elevated. Pt with CHF and pulmonary edema    PT Comments    Pt with excellent progression from evaluation. Pt able to perform transfers on his own today and did not require elevated bed. Pt with improved affect as well as mobility and educated for safety with RW and HEP. Pt stating son came to visit him, is in better health and will be able to assist him at home. Will continue to follow.   Follow Up Recommendations  Home health PT;Supervision for mobility/OOB     Equipment Recommendations       Recommendations for Other Services       Precautions / Restrictions Precautions Precautions: Fall Precaution Comments: watch 02 sats Restrictions Weight Bearing Restrictions: No    Mobility  Bed Mobility Overal bed mobility: Needs Assistance Bed Mobility: Supine to Sit     Supine to sit: Supervision;HOB elevated     General bed mobility comments: no physical assist, use of rail, increased time  Transfers Overall transfer level: Needs assistance Equipment used: Rolling walker (2 wheeled) Transfers: Sit to/from Stand Sit to Stand: Min guard         General transfer comment: stood from bed x 1, chair x 3  Ambulation/Gait Ambulation/Gait assistance: Min guard Ambulation Distance (Feet): 200 Feet Assistive device: Rolling walker (2 wheeled) Gait Pattern/deviations: Step-through pattern;Decreased stride length;Trunk flexed   Gait velocity interpretation: at or above normal  speed for age/gender General Gait Details: cues for posture and position in RW   Stairs            Wheelchair Mobility    Modified Rankin (Stroke Patients Only)       Balance Overall balance assessment: Needs assistance   Sitting balance-Leahy Scale: Good       Standing balance-Leahy Scale: Fair Standing balance comment: able to release walker with both hands to perform pericare                    Cognition Arousal/Alertness: Awake/alert Behavior During Therapy: WFL for tasks assessed/performed Overall Cognitive Status: Impaired/Different from baseline Area of Impairment: Problem solving         Safety/Judgement: Decreased awareness of deficits (Pt self limiting, participates with minimal encouragement.)   Problem Solving: Slow processing;Requires verbal cues      Exercises General Exercises - Lower Extremity Long Arc Quad: AROM;Seated;Both;20 reps Hip Flexion/Marching: AROM;Seated;Both;20 reps    General Comments        Pertinent Vitals/Pain Pain Assessment: No/denies pain  sats 87% on RA and 90-92% on 2L    Home Living Family/patient expects to be discharged to:: Private residence Living Arrangements: Children Available Help at Discharge: Family;Available 24 hours/day Type of Home: Apartment Home Access: Level entry   Home Layout: One level Home Equipment: Cane - single point;Walker - 2 wheels;Hospital bed;Bedside commode;Shower seat Additional Comments: pt lives with son but pt states son is relatively disabled himself    Prior Function Level of Independence: Needs assistance  Gait / Transfers Assistance Needed: ambulates with walker, requires elevated height of bed to stand ADL's / Homemaking Assistance Needed: Pt sponge bathes typically, can dress himself. Son does housekeeping and meal prep.     PT Goals (current goals can now be found in the care plan section) Acute Rehab PT Goals Patient Stated Goal: return home Progress towards  PT goals: Progressing toward goals    Frequency       PT Plan Discharge plan needs to be updated    Co-evaluation             End of Session Equipment Utilized During Treatment: Oxygen Activity Tolerance: Patient limited by fatigue Patient left: in chair;with call [place]/phone within reach;with chair alarm set     Time: 3736-6815 PT Time Calculation (min) (ACUTE ONLY): 36 min  Charges:  $Gait Training: 8-22 mins                    G Codes:      Melford Aase 2015-02-21, 1:09 PM Elwyn Reach, [place]

## 2015-01-24 NOTE — Progress Notes (Signed)
PROGRESS NOTE  Juan [name] SAY:[id number] DOB: Aug 11, 1940 DOA: 01/21/2015 PCP: [name], MD  Brief History  74 y.o. male, with dCHF, OSA, HTN, DM2, and myeloproliferative disorder presents with SOB that has been worsening over the last 3 weeks. Juan [name] reports that he lives at home with his son. 3 weeks ago his legs started swelling and he developed a cough with clear sputum. He is unable to lie flat at home and sleeps in his hospital bed with the [facility] elevated. Echocardiogram shows 20% drop in the patient's EF over the past 10 months. Heart failure team was consulted to assist with management.   Subjective:  Patient in chair, no headache or fevers. No chest pain. No shortness of breath. No abdominal pain or discomfort. No focal weakness. His swelling is coming down.   Assessment/Plan:  Acute Hypoxic respiratory failure, acute on chronic systolic and diastolic heart failure last EF 30% on echogram this admission.  -Continue being diuresed with IV Lasix, cardiology on board, still requiring oxygen, his dry weight is 226 pounds and he is still close to 40 pounds above his dry weight. Monitor intake output, daily weight, continue diuresis. Continue beta blocker and ACE inhibitor. Cardiology on board.   Pulmonary hypertension. Could be due to left heart failure. Pulmonary artery pressure 59. Also could be element of sleep apnea. -Plan as above. Outpatient follow-up with pulmonary and cardiology post discharge.  Symptomatic Anemia -Hgb dropped from 9.5 -> 7.2 in 3 months.Guiac negative.This is likely due to progression of the patient's myelodysplastic syndrome, see 1 unit of packed RBC on 01/21/2015. H&H stable.   Myelodysplastic syndrome -pt has chronic leukocytosis -01/21/2015--1 unit PRBC  -Discussed with Oncology on call will use irradiated blood products  -Discussed with the patient about possible leukemic conversion--pt does not want any diagnostic studies or treatment at  this time. -Out Patient oncology follow-up.  Bilateral Lower extremity edema -due to CHF  -dopplers-- neg DVT.  -Diurese per Cards.  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 4.3 cm in diameter, needs follow up scan outpatient for monitoring.    Family Communication:   Pt at beside Disposition Plan: Patient refuses skilled nursing facility, poor insight into medical situation;  Several more days in hospital DVT prophylaxis. Heparin  Procedures/Studies: Dg Chest 2 View  01/21/2015   CLINICAL DATA:  Chest pain, shortness of breath and cough for 3 weeks. History of CHF, hypertension and diabetes  EXAM: CHEST  2 VIEW  COMPARISON:  06/16/2014; chest CT- 06/21/2014  FINDINGS: Grossly unchanged enlarged cardiac silhouette and mediastinal contours given persistently reduced volumes. Pulmonary vasculature appears indistinct with cephalization of flow. Grossly unchanged bilateral infrahilar heterogeneous opacities, right greater than left. There is persistent moderate elevation of the right hemidiaphragm. No pleural effusion or pneumothorax. No evidence of edema. No acute osseus abnormalities. Re- demonstrated diffuse increased sclerosis of the osseous structures compatible with provided history of myeloproliferative disease.  IMPRESSION: 1. Suspected mild pulmonary edema on this hypoventilated examination. No definite pleural effusions. 2. Persistent moderate elevation of the right hemidiaphragm with associated bilateral infrahilar opacities, likely atelectasis. 3. Unchanged diffuse increased sclerosis of the imaged osseous structures compatible with provided history of myeloproliferative disease.   Electronically Signed   By: Sandi Mariscal M.D.   On: 01/21/2015 08:52  Objective: Filed Vitals:   01/23/15 2059  01/23/15 2123 01/24/15 0543 01/24/15 0900  BP: 136/75  126/38 128/67  Pulse: 88 92 86 89  Temp: 98.4 F (36.9 C)  98.2 F (36.8 C) 97.6 F (36.4 C)  TempSrc: Oral  Oral Oral  Resp: '18  20 18  '$ Height:      Weight:   117.436 kg (258 lb 14.4 oz)   SpO2: 98%  94% 94%    Intake/Output Summary (Last 24 hours) at 01/24/15 1044 Last data filed at 01/24/15 1000  Gross per 24 hour  Intake   1162 ml  Output   4575 ml  Net  -3413 ml   Weight change: -3.175 kg (-7 lb) Exam:   General:  Pt is alert, follows commands appropriately, not in acute distress  HEENT: No icterus, No thrush, No neck mass, [place]/AT  Cardiovascular: RRR, S1/S2, no rubs, no gallops  Respiratory: Fine bibasilar crackles. No wheeze.   Abdomen: Soft/+BS, non tender, non distended, no guarding; no hepatosplenomegaly.  Extremities:  3+ LE edema, No lymphangitis, No petechiae, No rashes, no synovitis; no cyanosis or clubbing  Data Reviewed: Basic Metabolic Panel:  Recent Labs Lab 01/21/15 0825 01/22/15 0404 01/23/15 0421 01/24/15 0419  NA 139 140 140 138  K 3.8 4.4 4.4 4.9  CL 109 107 106 102  CO2 '23 26 26 28  '$ GLUCOSE 102* 104* 100* 108*  BUN 23* 22* 20 22*  CREATININE 1.44* 1.42* 1.44* 1.51*  CALCIUM 9.5 9.1 9.1 9.0  MG  --   --  2.1 2.1   Liver Function Tests:  Recent Labs Lab 01/21/15 0825  AST 48*  ALT 22  ALKPHOS 99  BILITOT 0.5  PROT 6.3*  ALBUMIN 3.2*   No results for input(s): LIPASE, AMYLASE in the last 168 hours. No results for input(s): AMMONIA in the last 168 hours. CBC:  Recent Labs Lab 01/21/15 0825 01/22/15 0114 01/22/15 0404  WBC 15.8* 16.8* 16.9*  HGB 7.2* 8.1* 8.0*  HCT 24.2* 26.7* 26.0*  MCV 81.8 81.4 81.3  PLT 576* 550* 578*   Cardiac Enzymes:  Recent Labs Lab 01/21/15 1434 01/21/15 1807 01/22/15 0114  TROPONINI <0.03 <0.03 <0.03   BNP: Invalid input(s): POCBNP CBG:  Recent Labs Lab 01/23/15 0539 01/23/15 1201 01/23/15 1620 01/23/15 2108  01/24/15 0549  GLUCAP 107* 117* 115* 107* 107*    No results found for this or any previous visit (from the past 240 hour(s)).   Scheduled Meds: . sodium chloride   Intravenous Once  . antiseptic oral rinse  7 mL Mouth Rinse BID  . aspirin  325 mg Oral Daily  . feeding supplement (ENSURE ENLIVE)  237 mL Oral BID BM  . fluticasone  2 spray Each Nare Daily  . furosemide  80 mg Intravenous BID  . Gerhardt's butt cream   Topical TID  . heparin  5,000 Units Subcutaneous 3 times per day  . insulin aspart  0-15 Units Subcutaneous TID WC  . insulin aspart  0-5 Units Subcutaneous QHS  . [START ON 01/25/2015] lisinopril  2.5 mg Oral Daily  . metolazone  2.5 mg Oral Daily  . metoprolol tartrate  12.5 mg Oral BID  . potassium chloride  40 mEq Oral Daily  . sodium chloride  3 mL Intravenous Q12H   Continuous Infusions:    Thurnell Lose M.D on 01/24/2015 at 10:44 AM  Between 7am to 7pm - Pager - [phone], After 7pm go to [email or web address] - password [email or web address]  [facility]  - Office  [phone]   LOS: 3 days

## 2015-01-24 NOTE — Progress Notes (Signed)
Pt stated he does not wear cpap anymore. RT informed pt to call for RT if he changes his mind during the night

## 2015-01-24 NOTE — Evaluation (Signed)
Occupational Therapy Evaluation Patient Details Name: Juan [name] MRN: [id number] DOB: 1940/08/14 Today's Date: 01/24/2015    History of Present Illness Juan [name] is a 74 y.o. male, with DHF, OSA, HTN, DM2, and myeloproliferative disorder presents with SOB that has been worsening over the last 3 weeks. Juan [name] reports that he lives at home with his son. 3 weeks ago his legs started swelling and he developed a cough with clear sputum. He is unable to lie flat at home and sleeps in his hospital bed with the [facility] elevated. Pt with CHF and pulmonary edema   Clinical Impression   Pt was ambulating with a RW and able to sponge bathe, dress and toilet without assistance.  Pt presents with generalized weakness, impaired balance, decreased activity tolerance and slow cognitive processing.  Pt somewhat self limiting, but with minimal encouragement will participate maximally.  Will follow acutely.  Recommending home with his son and HHOT.      Follow Up Recommendations  Home health OT    Equipment Recommendations  None recommended by OT    Recommendations for Other Services       Precautions / Restrictions Precautions Precautions: Fall Precaution Comments: watch 02 sats Restrictions Weight Bearing Restrictions: No      Mobility Bed Mobility   Bed Mobility: Supine to Sit     Supine to sit: Supervision;HOB elevated     General bed mobility comments: no physical assist, use of rail, increased time  Transfers Overall transfer level: Needs assistance Equipment used: Rolling walker (2 wheeled) Transfers: Sit to/from Stand Sit to Stand: Min guard         General transfer comment: stood from bed x 1, chair x 3    Balance Overall balance assessment: Needs assistance   Sitting balance-Leahy Scale: Fair       Standing balance-Leahy Scale: Fair Standing balance comment: able to release walker with both hands to perform pericare                            ADL  Overall ADL's : Needs assistance/impaired Eating/Feeding: Independent;Sitting   Grooming: Wash/dry hands;Standing;Set up   Upper Body Bathing: Minimal assitance;Sitting   Lower Body Bathing: Moderate assistance;Sit to/from stand   Upper Body Dressing : Minimal assistance;Sitting   Lower Body Dressing: Moderate assistance;Sit to/from stand Lower Body Dressing Details (indicate cue type and reason): able to bend forward and doff socks, assist to [facility] Transfer: Min guard;Ambulation;RW   Toileting- Water quality scientist and Hygiene: Min guard;Sit to/from stand         General ADL Comments: Pt with 02 sats at 88% on RA, placed on 2L.     Vision     Perception     Praxis      Pertinent Vitals/Pain Pain Assessment: No/denies pain     Hand Dominance Right   Extremity/Trunk Assessment Upper Extremity Assessment Upper Extremity Assessment: Overall WFL for tasks assessed   Lower Extremity Assessment Lower Extremity Assessment: Defer to PT evaluation       Communication Communication Communication: No difficulties   Cognition Arousal/Alertness: Awake/alert Behavior During Therapy: WFL for tasks assessed/performed Overall Cognitive Status: Impaired/Different from baseline Area of Impairment: Problem solving         Safety/Judgement: Decreased awareness of deficits (Pt self limiting, participates with minimal encouragement.)   Problem Solving: Slow processing;Requires verbal cues     General Comments       Exercises  Shoulder Instructions      Home Living Family/patient expects to be discharged to:: Private residence Living Arrangements: Children Available Help at Discharge: Family;Available 24 hours/day Type of Home: Apartment Home Access: Level entry     Home Layout: One level     Bathroom Shower/Tub: Teacher, early years/pre: Standard     Home Equipment: Cane - single point;Walker - 2 wheels;Hospital bed;Bedside  commode;Shower seat   Additional Comments: pt lives with son but pt states son is relatively disabled himself      Prior Functioning/Environment Level of Independence: Needs assistance  Gait / Transfers Assistance Needed: ambulates with walker, requires elevated height of bed to stand ADL's / Homemaking Assistance Needed: Pt sponge bathes typically, can dress himself. Son does housekeeping and meal prep.        OT Diagnosis: Generalized weakness;Cognitive deficits   OT Problem List: Decreased strength;Impaired balance (sitting and/or standing);Decreased activity tolerance;Decreased knowledge of use of DME or AE;Cardiopulmonary status limiting activity   OT Treatment/Interventions: Self-care/ADL training;Energy conservation;DME and/or AE instruction;Patient/family education;Balance training    OT Goals(Current goals can be found in the care plan section) Acute Rehab OT Goals Patient Stated Goal: return home OT Goal Formulation: With patient Time For Goal Achievement: 02/07/15 Potential to Achieve Goals: Good ADL Goals Pt Will Perform Grooming: with supervision;standing (3 activities) Pt Will Perform Upper Body Bathing: with set-up;sitting Pt Will Perform Lower Body Bathing: with set-up;with supervision;sit to/from stand Pt Will Perform Upper Body Dressing: with set-up;sitting Pt Will Perform Lower Body Dressing: with supervision;sit to/from stand Pt Will Transfer to Toilet: with supervision;ambulating;bedside commode (over toilet) Pt Will Perform Toileting - Clothing Manipulation and hygiene: with supervision;sit to/from stand  OT Frequency: Min 2X/week   Barriers to D/C:            Co-evaluation              End of Session Equipment Utilized During Treatment: Rolling walker;Oxygen (2L)  Activity Tolerance: Patient tolerated treatment well Patient left: in chair;with call [place]/phone within reach;with chair alarm set   Time: 1056-1130 OT Time Calculation (min): 34  min Charges:  OT General Charges $OT Visit: 1 Procedure OT Evaluation $Initial OT Evaluation Tier I: 1 Procedure G-Codes:    Malka So 01/24/2015, 11:44 AM  [phone]

## 2015-01-24 NOTE — Progress Notes (Signed)
Advanced Heart Failure Rounding Note  Primary Physician: Dr [name] Primary Cardiologist: Dr Johnsie Cancel HF: Juan [name]  Subjective:    Feels like his breathing is better.  Is going to walk today with PT.  Complaining of itching and burning his thighs.  Uses a menthol creme at home with some relief.  Out 3.2 L and down 7 lbs with 80 mg IV lasix BID and 2.5 mg metolazone. Cr up slightly.  Objective:   Weight Range: 258 lb 14.4 oz (117.436 kg) Body mass index is 32.36 kg/(m^2).   Vital Signs:   Temp:  [98.2 F (36.8 C)-99.5 F (37.5 C)] 98.2 F (36.8 C) (09/08 0543) Pulse Rate:  [70-92] 86 (09/08 0543) Resp:  [18-20] 20 (09/08 0543) BP: (126-136)/(38-75) 126/38 mmHg (09/08 0543) SpO2:  [94 %-98 %] 94 % (09/08 0543) Weight:  [258 lb 14.4 oz (117.436 kg)] 258 lb 14.4 oz (117.436 kg) (09/08 0543) Last BM Date: 01/23/15  Weight change: Filed Weights   01/22/15 0507 01/23/15 0541 01/24/15 0543  Weight: 273 lb 13 oz (124.2 kg) 265 lb 14.4 oz (120.611 kg) 258 lb 14.4 oz (117.436 kg)    Intake/Output:   Intake/Output Summary (Last 24 hours) at 01/24/15 0907 Last data filed at 01/24/15 0547  Gross per 24 hour  Intake    942 ml  Output   4215 ml  Net  -3273 ml     Physical Exam:  General: Chronically ill and elderly appearing. Lying in bed HEENT: normal, 02 via [place] Neck: supple. JVP 14 cm+. Carotids 2+ bilat; no bruits. No lymphadenopathy or thryomegaly noted. Cor: PMI nondisplaced. Regular rate & rhythm. No rubs, gallops or murmurs appreciated. Lungs: Diminished bilateral bases Abdomen: Obese, soft, NT, ND. No hepatosplenomegaly. No bruits or masses. +BS Extremities: no cyanosis, clubbing, rash. 2-3+ edema to thighs. Chronic venous stasis. Upper thighs with mild erythema. Neuro: alert & orientedx3, cranial nerves grossly intact. moves all 4 extremities w/o difficulty. Affect pleasant  Telemetry: NSR 70s  Labs: CBC  Recent Labs  01/22/15 0114 01/22/15 0404  WBC 16.8*  16.9*  HGB 8.1* 8.0*  HCT 26.7* 26.0*  MCV 81.4 81.3  PLT 550* 606*   Basic Metabolic Panel  Recent Labs  01/23/15 0421 01/24/15 0419  NA 140 138  K 4.4 4.9  CL 106 102  CO2 26 28  GLUCOSE 100* 108*  BUN 20 22*  CALCIUM 9.1 9.0  MG 2.1 2.1   Liver Function Tests No results for input(s): AST, ALT, ALKPHOS, BILITOT, PROT, ALBUMIN in the last 72 hours. No results for input(s): LIPASE, AMYLASE in the last 72 hours. Cardiac Enzymes  Recent Labs  01/21/15 1434 01/21/15 1807 01/22/15 0114  TROPONINI <0.03 <0.03 <0.03    BNP: BNP (last 3 results)  Recent Labs  06/16/14 1940 06/20/14 0538 01/21/15 0825  BNP 18.8 227.8* 518.8*    ProBNP (last 3 results)  Recent Labs  01/25/14 1115 03/29/14 1549 04/14/14 2335  PROBNP 828.2* 6628.0* 4369.0*     D-Dimer No results for input(s): DDIMER in the last 72 hours. Hemoglobin A1C No results for input(s): HGBA1C in the last 72 hours. Fasting Lipid Panel No results for input(s): CHOL, HDL, LDLCALC, TRIG, CHOLHDL, LDLDIRECT in the last 72 hours. Thyroid Function Tests No results for input(s): TSH, T4TOTAL, T3FREE, THYROIDAB in the last 72 hours.  Invalid input(s): FREET3  Other results:     Imaging/Studies:  No results found.  Latest Echo  Latest Cath   Medications:  Scheduled Medications: . sodium chloride   Intravenous Once  . antiseptic oral rinse  7 mL Mouth Rinse BID  . aspirin  325 mg Oral Daily  . feeding supplement (ENSURE ENLIVE)  237 mL Oral BID BM  . fluticasone  2 spray Each Nare Daily  . furosemide  80 mg Intravenous BID  . heparin  5,000 Units Subcutaneous 3 times per day  . insulin aspart  0-15 Units Subcutaneous TID WC  . insulin aspart  0-5 Units Subcutaneous QHS  . [START ON 01/25/2015] lisinopril  2.5 mg Oral Daily  . metolazone  2.5 mg Oral Daily  . metoprolol tartrate  12.5 mg Oral BID  . potassium chloride  40 mEq Oral Daily  . sodium chloride  3 mL Intravenous Q12H     Infusions:    PRN Medications: sodium chloride, acetaminophen, guaiFENesin-dextromethorphan, HYDROcodone-acetaminophen, ondansetron (ZOFRAN) IV, senna-docusate, sodium chloride   Assessment/Plan   1. Acute on Chronic diastolic CHF, EF 35% echo 01/21/15, NYHA class III - Normal myoview 2007 2. OSA 3. CKD stage III - Baseline 1.4 4. HTN 5. DM2 6. 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 Continue 80 mg IV lasix BID with metolazone.   Lisinopril was held during an admission in 11/15 ? AKI.  Has orders in to restart tomorrow.  Will monitor cr closely with ongoing diuresis.  Education and improving his insight into his health problems will likely continue to be an issue.  Will need close follow up in HF clinic.   Dispo: Still with significant volume overload. Continue IV lasix with metolazone.  PT recommends SNF, but pt has said he will refuse.  Pt needs 24 hr supervision, as is high fall risk.    Length of Stay: 3  Shirley Friar PA-C 01/24/2015, 9:07 AM  Advanced Heart Failure Team Pager [phone] (M-F; 7a - 4p)  Please contact [facility] for night-coverage after hours (4p -7a ) and weekends on [email or web address]   Patient seen and examined with Juan Kilts, PA-C. We discussed all aspects of the encounter. I agree with the assessment and plan as stated above.   Diuresing well. Will continue current regimen. With EF down have suggested R/L cath once diuresed but he refuses and given co-morbidities and lack of angina, I agree with conservative approach. He has very poor insight into his HF and is at very high risk for readmission.   Juan Maland,MD 4:45 PM

## 2015-01-25 DIAGNOSIS — N183 Chronic kidney disease, stage 3 (moderate): Secondary | ICD-10-CM

## 2015-01-25 LAB — BASIC METABOLIC PANEL
ANION GAP: 9 (ref 5–15)
BUN: 22 mg/dL — ABNORMAL HIGH (ref 6–20)
CALCIUM: 9.2 mg/dL (ref 8.9–10.3)
CO2: 28 mmol/L (ref 22–32)
Chloride: 102 mmol/L (ref 101–111)
Creatinine, Ser: 1.59 mg/dL — ABNORMAL HIGH (ref 0.61–1.24)
GFR, EST AFRICAN AMERICAN: 48 mL/min — AB (ref >60)
GFR, EST NON AFRICAN AMERICAN: 41 mL/min — AB (ref >60)
Glucose, Bld: 109 mg/dL — ABNORMAL HIGH (ref 65–99)
POTASSIUM: 4.7 mmol/L (ref 3.5–5.1)
SODIUM: 139 mmol/L (ref 135–145)

## 2015-01-25 LAB — GLUCOSE, CAPILLARY
GLUCOSE-CAPILLARY: 105 mg/dL — AB (ref 65–99)
GLUCOSE-CAPILLARY: 128 mg/dL — AB (ref 65–99)
Glucose-Capillary: 166 mg/dL — ABNORMAL HIGH (ref 65–99)
Glucose-Capillary: 119 mg/dL — ABNORMAL HIGH (ref 65–99)

## 2015-01-25 MED ORDER — CARVEDILOL 3.125 MG PO TABS
3.1250 mg | ORAL_TABLET | Freq: Two times a day (BID) | ORAL | Status: DC
  Administered 2015-01-25 – 2015-01-26 (×3): 3.125 mg via ORAL
  Filled 2015-01-25 (×2): qty 1

## 2015-01-25 MED ORDER — POTASSIUM CHLORIDE CRYS ER 20 MEQ PO TBCR
20.0000 meq | EXTENDED_RELEASE_TABLET | Freq: Every day | ORAL | Status: DC
  Administered 2015-01-26: 20 meq via ORAL
  Filled 2015-01-25 (×2): qty 1

## 2015-01-25 NOTE — Care Management Note (Signed)
Case Management Note  Patient Details  Name: Juan [name] MRN: [id number] Date of Birth: 05/20/1940  Subjective/Objective:       Admitted with CHF             Action/Plan: Patient lives at home with son. His son does all of the cooking and cleaning. Patient stated that his son cooks healthy for him - lots of baked foods with very little salt. He has Medicare for medical insurance/ prescription drug coverage; Pharmacy [facility]. He has scales to weigh himself daily. Does not exercise and does very little walking. He has a walker and a cane at home. Patient would benefit from the Disease Management Program for CHF. HHC choice offered, patient chose [facility]. Mary with [facility] called for arrangements. Attending MD at discharge please order HHRN/ PT/ OT/ nurses aide/ SW; bariatric wheelchair. Expected Discharge Date:   possibly 01/28/2015               Expected Discharge Plan:  [place]  Discharge planning Services  CM Consult  Choice offered to:  Patient  DME Arranged:  Media planner DME Agency:  [place]:  RN, Disease Management, PT, OT, Nurse's Aide [place] Agency:  Other - See comment  Status of Service:  In process, will continue to follow  Medicare Important Message Given:  [facility] notification given   Sherrilyn Rist [phone] 01/25/2015, 1:37 PM

## 2015-01-25 NOTE — Progress Notes (Signed)
PROGRESS NOTE  Juan [name] FUX:[id number] DOB: 1941-04-26 DOA: 01/21/2015 PCP: [name], MD  Brief History  74 y.o. male, with dCHF, OSA, HTN, DM2, and myeloproliferative disorder presents with SOB that has been worsening over the last 3 weeks. Juan [name] reports that he lives at home with his son. 3 weeks ago his legs started swelling and he developed a cough with clear sputum. He is unable to lie flat at home and sleeps in his hospital bed with the [facility] elevated. Echocardiogram shows 20% drop in the patient's EF over the past 10 months. Heart failure team was consulted to assist with management.   Subjective:  Patient in chair, no headache or fevers. No chest pain. No shortness of breath. No abdominal pain or discomfort. No focal weakness. His swelling is coming down.   Assessment/Plan:  Acute Hypoxic respiratory failure, acute on chronic systolic and diastolic heart failure last EF 30% on echogram this admission.  -Continue being diuresed with IV Lasix, cardiology on board, trying to taper off oxygen, his dry weight is 226 pounds over diuresed 12 L by 02/04/2015. Monitor intake output, daily weight, continue diuresis. Continue beta blocker and ACE inhibitor. Cardiology on board. Will try to take off oxygen and check room air ambulatory pulse ox.  Pulmonary hypertension. Could be due to left heart failure. Pulmonary artery pressure 59. Also could be element of sleep apnea. -Plan as above. Outpatient follow-up with pulmonary and cardiology post discharge.  Symptomatic Anemia -Hgb dropped from 9.5 -> 7.2 in 3 months.Guiac negative.This is likely due to progression of the patient's myelodysplastic syndrome, see 1 unit of packed RBC on 01/21/2015. H&H stable.   Myelodysplastic syndrome -pt has chronic leukocytosis, 01/21/2015--1 unit PRBC , use irradiated blood products  -Previous physician had Discussed with the patient about possible leukemic conversion--pt does not want any  diagnostic studies or treatment at this time. Out Patient oncology follow-up.  Bilateral Lower extremity edema -due to CHF, dopplers  neg DVT.Diurese per Cards.  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 4.3 cm in diameter, needs follow up scan outpatient for monitoring.    Family Communication:   Pt at beside Disposition Plan: Patient refuses skilled nursing facility, poor insight into medical situation;  Several more days in hospital DVT prophylaxis. Heparin  Procedures/Studies: Dg Chest 2 View  01/21/2015   CLINICAL DATA:  Chest pain, shortness of breath and cough for 3 weeks. History of CHF, hypertension and diabetes  EXAM: CHEST  2 VIEW  COMPARISON:  06/16/2014; chest CT- 06/21/2014  FINDINGS: Grossly unchanged enlarged cardiac silhouette and mediastinal contours given persistently reduced volumes. Pulmonary vasculature appears indistinct with cephalization of flow. Grossly unchanged bilateral infrahilar heterogeneous opacities, right greater than left. There is persistent moderate elevation of the right hemidiaphragm. No pleural effusion or pneumothorax. No evidence of edema. No acute osseus abnormalities. Re- demonstrated diffuse increased sclerosis of the osseous structures compatible with provided history of myeloproliferative disease.  IMPRESSION: 1. Suspected mild pulmonary edema on this hypoventilated examination. No definite pleural effusions. 2. Persistent moderate elevation of the right hemidiaphragm with associated bilateral infrahilar opacities, likely atelectasis. 3. Unchanged diffuse increased sclerosis of the imaged osseous structures compatible with provided history of myeloproliferative disease.   Electronically Signed   By: Sandi Mariscal M.D.   On: 01/21/2015 08:52  Objective: Filed  Vitals:   01/24/15 1404 01/24/15 1819 01/24/15 2109 01/25/15 0603  BP: 160/90 121/68 124/66 126/63  Pulse: 84 81 87 75  Temp: 98.2 F (36.8 C)  98 F (36.7 C) 98.9 F (37.2 C)  TempSrc: Oral  Oral Oral  Resp: 18  18   Height:      Weight:    116.257 kg (256 lb 4.8 oz)  SpO2: 98%  97% 95%    Intake/Output Summary (Last 24 hours) at 01/25/15 1003 Last data filed at 01/25/15 0919  Gross per 24 hour  Intake    463 ml  Output   2175 ml  Net  -1712 ml   Weight change: -1.179 kg (-2 lb 9.6 oz) Exam:   General:  Pt is alert, follows commands appropriately, not in acute distress  HEENT: No icterus, No thrush, No neck mass, [place]/AT  Cardiovascular: RRR, S1/S2, no rubs, no gallops  Respiratory: Fine bibasilar crackles. No wheeze.   Abdomen: Soft/+BS, non tender, non distended, no guarding; no hepatosplenomegaly.  Extremities:  3+ LE edema, No lymphangitis, No petechiae, No rashes, no synovitis; no cyanosis or clubbing  Data Reviewed: Basic Metabolic Panel:  Recent Labs Lab 01/21/15 0825 01/22/15 0404 01/23/15 0421 01/24/15 0419 01/25/15 0824  NA 139 140 140 138 139  K 3.8 4.4 4.4 4.9 4.7  CL 109 107 106 102 102  CO2 '23 26 26 28 28  '$ GLUCOSE 102* 104* 100* 108* 109*  BUN 23* 22* 20 22* 22*  CREATININE 1.44* 1.42* 1.44* 1.51* 1.59*  CALCIUM 9.5 9.1 9.1 9.0 9.2  MG  --   --  2.1 2.1  --    Liver Function Tests:  Recent Labs Lab 01/21/15 0825  AST 48*  ALT 22  ALKPHOS 99  BILITOT 0.5  PROT 6.3*  ALBUMIN 3.2*   No results for input(s): LIPASE, AMYLASE in the last 168 hours. No results for input(s): AMMONIA in the last 168 hours. CBC:  Recent Labs Lab 01/21/15 0825 01/22/15 0114 01/22/15 0404  WBC 15.8* 16.8* 16.9*  HGB 7.2* 8.1* 8.0*  HCT 24.2* 26.7* 26.0*  MCV 81.8 81.4 81.3  PLT 576* 550* 578*   Cardiac Enzymes:  Recent Labs Lab 01/21/15 1434 01/21/15 1807 01/22/15 0114  TROPONINI <0.03 <0.03 <0.03   BNP: Invalid input(s):  POCBNP CBG:  Recent Labs Lab 01/24/15 0549 01/24/15 1129 01/24/15 1557 01/24/15 2108 01/25/15 0615  GLUCAP 107* 113* 102* 118* 105*    No results found for this or any previous visit (from the past 240 hour(s)).   Scheduled Meds: . sodium chloride   Intravenous Once  . antiseptic oral rinse  7 mL Mouth Rinse BID  . aspirin  325 mg Oral Daily  . feeding supplement (ENSURE ENLIVE)  237 mL Oral BID BM  . fluticasone  2 spray Each Nare Daily  . furosemide  80 mg Intravenous BID  . Gerhardt's butt cream   Topical TID  . heparin  5,000 Units Subcutaneous 3 times per day  . insulin aspart  0-15 Units Subcutaneous TID WC  . insulin aspart  0-5 Units Subcutaneous QHS  . lisinopril  2.5 mg Oral Daily  . metolazone  2.5 mg Oral Daily  . metoprolol tartrate  12.5 mg Oral BID  . potassium chloride  40 mEq Oral Daily  . sodium chloride  3 mL Intravenous Q12H   Continuous Infusions:    Thurnell Lose M.D on 01/25/2015 at 10:03 AM  Between 7am to 7pm -  Pager - [phone], After 7pm go to [email or web address] - password [email or web address]  [phone]   LOS: 4 days

## 2015-01-25 NOTE — Progress Notes (Signed)
Patient had five beats of V-Tach. Patient was aysmptomatic and complained of no chest pain or discomfort. Notified Hosptialist via text page. Will continue to monitor patient to end of shift.

## 2015-01-25 NOTE — Progress Notes (Signed)
Advanced Heart Failure Rounding Note  Primary Physician: Dr [name] Primary Cardiologist: Dr Johnsie Cancel HF: Bensimhon  Subjective:    Feels good today.  Says swelling is coming down but slowly.  Understanding about being here over the weekend.  Had a good day with PT yesterday, improved a lot from their initial encounter.  Out 2.1 L and down 2 lbs with 80 mg IV lasix BID and 2.5 mg metolazone. Cr relatively stable.  Objective:   Weight Range: 256 lb 4.8 oz (116.257 kg) Body mass index is 32.04 kg/(m^2).   Vital Signs:   Temp:  [97.6 F (36.4 C)-98.9 F (37.2 C)] 98.9 F (37.2 C) (09/09 0603) Pulse Rate:  [75-89] 75 (09/09 0603) Resp:  [18] 18 (09/08 2109) BP: (121-160)/(63-90) 126/63 mmHg (09/09 0603) SpO2:  [94 %-98 %] 95 % (09/09 0603) Weight:  [256 lb 4.8 oz (116.257 kg)] 256 lb 4.8 oz (116.257 kg) (09/09 0603) Last BM Date: 01/23/15  Weight change: Filed Weights   01/23/15 0541 01/24/15 0543 01/25/15 0603  Weight: 265 lb 14.4 oz (120.611 kg) 258 lb 14.4 oz (117.436 kg) 256 lb 4.8 oz (116.257 kg)    Intake/Output:   Intake/Output Summary (Last 24 hours) at 01/25/15 0709 Last data filed at 01/25/15 0604  Gross per 24 hour  Intake    683 ml  Output   2825 ml  Net  -2142 ml     Physical Exam:  General: Chronically ill and elderly appearing. Reclined in chair with feet up. HEENT: normal, 02 via [place] Neck: supple. JVP 12-14 cm+. Carotids 2+ bilat; no bruits. No lymphadenopathy or thryomegaly. Cor: PMI nondisplaced. Regular rate & rhythm. No rubs, gallops or murmurs. Lungs: Diminished bibasilar Abdomen: Obese, soft, NT, ND. No hepatosplenomegaly. No bruits or masses. +BS Extremities: no cyanosis, clubbing, rash. 2+ edema to thighs. Chronic venous stasis.  Neuro: alert & orientedx3, cranial nerves grossly intact. moves all 4 extremities w/o difficulty. Affect pleasant  Telemetry: NSR 70s  Labs: CBC No results for input(s): WBC, NEUTROABS, HGB, HCT, MCV, PLT in the  last 72 hours. Basic Metabolic Panel  Recent Labs  01/23/15 0421 01/24/15 0419  NA 140 138  K 4.4 4.9  CL 106 102  CO2 26 28  GLUCOSE 100* 108*  BUN 20 22*  CALCIUM 9.1 9.0  MG 2.1 2.1   Liver Function Tests No results for input(s): AST, ALT, ALKPHOS, BILITOT, PROT, ALBUMIN in the last 72 hours. No results for input(s): LIPASE, AMYLASE in the last 72 hours. Cardiac Enzymes No results for input(s): CKTOTAL, CKMB, CKMBINDEX, TROPONINI in the last 72 hours.  BNP: BNP (last 3 results)  Recent Labs  06/16/14 1940 06/20/14 0538 01/21/15 0825  BNP 18.8 227.8* 518.8*    ProBNP (last 3 results)  Recent Labs  01/25/14 1115 03/29/14 1549 04/14/14 2335  PROBNP 828.2* 6628.0* 4369.0*     D-Dimer No results for input(s): DDIMER in the last 72 hours. Hemoglobin A1C No results for input(s): HGBA1C in the last 72 hours. Fasting Lipid Panel No results for input(s): CHOL, HDL, LDLCALC, TRIG, CHOLHDL, LDLDIRECT in the last 72 hours. Thyroid Function Tests No results for input(s): TSH, T4TOTAL, T3FREE, THYROIDAB in the last 72 hours.  Invalid input(s): FREET3  Other results:     Imaging/Studies:  No results found.  Latest Echo  Latest Cath   Medications:     Scheduled Medications: . sodium chloride   Intravenous Once  . antiseptic oral rinse  7 mL Mouth Rinse BID  .  aspirin  325 mg Oral Daily  . feeding supplement (ENSURE ENLIVE)  237 mL Oral BID BM  . fluticasone  2 spray Each Nare Daily  . furosemide  80 mg Intravenous BID  . Gerhardt's butt cream   Topical TID  . heparin  5,000 Units Subcutaneous 3 times per day  . insulin aspart  0-15 Units Subcutaneous TID WC  . insulin aspart  0-5 Units Subcutaneous QHS  . lisinopril  2.5 mg Oral Daily  . metolazone  2.5 mg Oral Daily  . metoprolol tartrate  12.5 mg Oral BID  . potassium chloride  40 mEq Oral Daily  . sodium chloride  3 mL Intravenous Q12H    Infusions:    PRN Medications: sodium  chloride, acetaminophen, guaiFENesin-dextromethorphan, HYDROcodone-acetaminophen, ondansetron (ZOFRAN) IV, senna-docusate, sodium chloride   Assessment/Plan   1. Acute on Chronic diastolic CHF, EF 60% echo 01/21/15, NYHA class III - Normal myoview 2007 2. OSA 3. CKD stage III - Baseline 1.4 4. HTN 5. DM2 6. 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  Does not want further investigation or treatment.   He remains quite overloaded with 20+ lbs from baseline. Continue 80 mg IV lasix BID with metolazone pending BMET. May need to slow down vs hold a day if Cr continues to rise.   Lisinopril was held during an admission in 11/15 ? AKI. Restarting today per Dr Candiss Norse.  Will monitor cr closely with ongoing diuresis.  Education and improving his insight into his health problems will likely continue to be an issue.  Will need close follow up in HF clinic. He is at a very high risk for readmission.  Dispo: Still 20+ lbs from baseline. Continue IV lasix with metolazone. Likely home early next week. PT originally recommended SNF but pt refused.  Has had some improvement and now recommend [facility] PT with supervision for mobility/OOB.  Length of Stay: 4  Shirley Friar PA-C 01/25/2015, 7:09 AM  Advanced Heart Failure Team Pager [phone] (M-F; 7a - 4p)  Please contact [facility] for night-coverage after hours (4p -7a ) and weekends on [email or web address]  Patient seen with PA, agree with the above note.    Echo this admission with EF 30%, moderate LVH, diffuse hypokinesis.  Patient remains volume overloaded.  He is diuresing reasonably well with current regimen but still has a way to go.  - Continue current IV Lasix with metolazone.  - Ok to start low dose lisinopril 2.5 daily but need to follow creatinine closely.  - Would use Coreg 3.125 mg bid rather than metoprolol tartrate.   - Follow creatinine closely.  - Says he cannot tolerate compression wraps because they given  him chest pain.   EF newly decreased on echo this admission.  Cardiac cath has been discussed, he does not want cath.  He is not having chest pain.   Loralie Champagne 01/25/2015 12:31 PM

## 2015-01-25 NOTE — Plan of Care (Signed)
Problem: Phase I Progression Outcomes Goal: EF % per last Echo/documented,Core Reminder form on chart Outcome: Completed/Met Date Met:  01/25/15 EF performed on 01/21/2015 EF % result is 30%

## 2015-01-25 NOTE — Progress Notes (Signed)
This encounter was created in error - please disregard.

## 2015-01-25 NOTE — Progress Notes (Signed)
Pt a/o, no c/o pain, pt up in chair with legs elevated, VSS, pt stable

## 2015-01-25 NOTE — Progress Notes (Signed)
Patient refused CPAP for tonight 

## 2015-01-25 NOTE — Progress Notes (Addendum)
Physical Therapy Treatment Patient Details Name: Juan [name] MRN: [id number] DOB: 21-Apr-1941 Today's Date: 01/25/2015    History of Present Illness Juan [name] is a 74 y.o. male, with DHF, OSA, HTN, DM2, and myeloproliferative disorder presents with SOB that has been worsening over the last 3 weeks. Juan [name] reports that he lives at home with his son. 3 weeks ago his legs started swelling and he developed a cough with clear sputum. He is unable to lie flat at home and sleeps in his hospital bed with the [facility] elevated. Pt with CHF and pulmonary edema    PT Comments    Pt up in chair on arrival, very pleasant and continues to improve with function and gait. Encouraged continued HEP and ambulation over the weekend. Pt continues to require supplemental oxygen to maintain sats >87%. Will follow.   Follow Up Recommendations  Home health PT;Supervision for mobility/OOB     Equipment Recommendations       Recommendations for Other Services       Precautions / Restrictions Precautions Precautions: Fall    Mobility  Bed Mobility               General bed mobility comments: in chair on arrival  Transfers Overall transfer level: Needs assistance   Transfers: Sit to/from Stand Sit to Stand: Supervision         General transfer comment: stood from chair x 2 with supervision for safety  Ambulation/Gait Ambulation/Gait assistance: Min guard Ambulation Distance (Feet): 175 Feet Assistive device: Rolling walker (2 wheeled) Gait Pattern/deviations: Step-through pattern;Decreased stride length;Trunk flexed   Gait velocity interpretation: Below normal speed for age/gender General Gait Details: cues for posture and position in RW   Stairs            Wheelchair Mobility    Modified Rankin (Stroke Patients Only)       Balance Overall balance assessment: Needs assistance   Sitting balance-Leahy Scale: Good       Standing balance-Leahy Scale: Fair                       Cognition Arousal/Alertness: Awake/alert Behavior During Therapy: WFL for tasks assessed/performed   Area of Impairment: Problem solving             Problem Solving: Slow processing;Requires verbal cues      Exercises General Exercises - Lower Extremity Long Arc Quad: AROM;Seated;Both;20 reps Hip Flexion/Marching: AROM;Seated;Both;20 reps Toe Raises: AROM;Seated;Both;20 reps    General Comments        Pertinent Vitals/Pain Pain Assessment: No/denies pain  sats 90% on 2L with gait, 87% RA, 87% on 1L with gait     Home Living                      Prior Function            PT Goals (current goals can now be found in the care plan section) Progress towards PT goals: Progressing toward goals    Frequency       PT Plan Current plan remains appropriate    Co-evaluation             End of Session Equipment Utilized During Treatment: Oxygen Activity Tolerance: Patient tolerated treatment well Patient left: in chair;with call Juan [name]/phone within reach;with chair alarm set     Time: 0750-0822 PT Time Calculation (min) (ACUTE ONLY): 32 min  Charges:  $Gait Training: 8-22 mins $Therapeutic Exercise: 8-22 mins  G CodesMelford Aase 2015/01/28, 10:45 AM Elwyn Reach, [place]

## 2015-01-26 LAB — BASIC METABOLIC PANEL
Anion gap: 8 (ref 5–15)
BUN: 22 mg/dL — ABNORMAL HIGH (ref 6–20)
CALCIUM: 9.3 mg/dL (ref 8.9–10.3)
CHLORIDE: 100 mmol/L — AB (ref 101–111)
CO2: 30 mmol/L (ref 22–32)
CREATININE: 1.68 mg/dL — AB (ref 0.61–1.24)
GFR calc non Af Amer: 39 mL/min — ABNORMAL LOW (ref >60)
GFR, EST AFRICAN AMERICAN: 45 mL/min — AB (ref >60)
GLUCOSE: 112 mg/dL — AB (ref 65–99)
Potassium: 4.7 mmol/L (ref 3.5–5.1)
Sodium: 138 mmol/L (ref 135–145)

## 2015-01-26 LAB — GLUCOSE, CAPILLARY
GLUCOSE-CAPILLARY: 100 mg/dL — AB (ref 65–99)
GLUCOSE-CAPILLARY: 135 mg/dL — AB (ref 65–99)
Glucose-Capillary: 105 mg/dL — ABNORMAL HIGH (ref 65–99)
Glucose-Capillary: 94 mg/dL (ref 65–99)

## 2015-01-26 LAB — MAGNESIUM: MAGNESIUM: 2.3 mg/dL (ref 1.7–2.4)

## 2015-01-26 NOTE — Progress Notes (Signed)
PROGRESS NOTE  Juan [name] XBJ:[id number] DOB: 03/29/1941 DOA: 01/21/2015 PCP: Juan November, MD  Brief History  74 y.o. male, with dCHF, OSA, HTN, DM2, and myeloproliferative disorder presents with SOB that has been worsening over the last 3 weeks. Juan [name] reports that he lives at home with his son. 3 weeks ago his legs started swelling and he developed a cough with clear sputum. He is unable to lie flat at home and sleeps in his [name] bed with the Juan [name] elevated. Echocardiogram shows 20% drop in the patient's EF over the past 10 months. Heart failure team was consulted to assist with management.   Subjective:  Patient in chair, no headache or fevers. No chest pain. No shortness of breath. No abdominal pain or discomfort. No focal weakness. His swelling is coming down.   Assessment/Plan:  Acute Hypoxic respiratory failure, acute on chronic systolic and diastolic heart failure last EF 30% on echogram this admission.  -Continue being diuresed with IV Lasix, cardiology on board, has come off of oxygen on 01/25/2015, his dry weight is 226 pounds over diuresed 14 L by 01/26/2015. Monitor intake output, daily weight, continue diuresis. Continue beta blocker , since renal function has declined Will hold ACE inhibitor on 01/26/2015. Cardiology on board. Question if we can discharge him on oral Lasix since his lungs are relatively clear without oxygen need and continue to diurese his lower extremity edema.   Few beats of asymptomatic nonsustained V. tach on 01/25/2015 11 PM.  Electrolytes stable, EF is under 30%, on beta blocker, and's renal function worsening holding ACE inhibitor. Cardiology on board will monitor.   Pulmonary hypertension. Could be due to left heart failure. Pulmonary artery pressure 59. Also could be element of sleep apnea. -Plan as above. Outpatient follow-up with pulmonary and cardiology post discharge.   Symptomatic Anemia -Hgb dropped from 9.5 -> 7.2 in 3  months.Guiac negative.This is likely due to progression of the patient's myelodysplastic syndrome, see 1 unit of packed RBC on 01/21/2015. H&H stable.   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 Out Patient oncology follow-up.  Bilateral Lower extremity edema -due to CHF, dopplers  neg DVT.Diurese per Cards.  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 4.3 cm in diameter, needs follow up scan outpatient for monitoring.    Family Communication:   Pt at beside Disposition Plan: Patient refuses skilled nursing facility, poor insight into medical situation;  Several more days in [name] DVT prophylaxis. Heparin  Procedures/Studies: Dg Chest 2 View  01/21/2015   CLINICAL DATA:  Chest pain, shortness of breath and cough for 3 weeks. History of CHF, hypertension and diabetes  EXAM: CHEST  2 VIEW  COMPARISON:  06/16/2014; chest CT- 06/21/2014  FINDINGS: Grossly unchanged enlarged cardiac silhouette and mediastinal contours given persistently reduced volumes. Pulmonary vasculature appears indistinct with cephalization of flow. Grossly unchanged bilateral infrahilar heterogeneous opacities, right greater than left. There is persistent moderate elevation of the right hemidiaphragm. No pleural effusion or pneumothorax. No evidence of edema. No acute osseus abnormalities. Re- demonstrated diffuse increased sclerosis of the osseous structures compatible with provided history of myeloproliferative disease.  IMPRESSION: 1.  Suspected mild pulmonary edema on this hypoventilated examination. No definite pleural  effusions. 2. Persistent moderate elevation of the right hemidiaphragm with associated bilateral infrahilar opacities, likely atelectasis. 3. Unchanged diffuse increased sclerosis of the imaged osseous structures compatible with provided history of myeloproliferative disease.   Electronically Signed   By: Sandi Mariscal M.D.   On: 01/21/2015 08:52       Objective: Filed Vitals:   01/25/15 1700 01/25/15 1759 01/25/15 2002 01/26/15 0536  BP:   140/73 112/74  Pulse:   83 78  Temp:   98.3 F (36.8 C) 98.3 F (36.8 C)  TempSrc:   Oral Oral  Resp:   18 18  Height:      Weight:    114.9 kg (253 lb 4.9 oz)  SpO2: 86% 97% 98% 98%    Intake/Output Summary (Last 24 hours) at 01/26/15 1116 Last data filed at 01/26/15 1114  Gross per 24 hour  Intake   1200 ml  Output   2825 ml  Net  -1625 ml   Weight change: -1.357 kg (-2 lb 15.9 oz) Exam:   General:  Pt is alert, follows commands appropriately, not in acute distress  HEENT: No icterus, No thrush, No neck mass, [place]/AT  Cardiovascular: RRR, S1/S2, no rubs, no gallops  Respiratory: Fine bibasilar crackles. No wheeze.   Abdomen: Soft/+BS, non tender, non distended, no guarding; no hepatosplenomegaly.  Extremities:  3+ LE edema, No lymphangitis, No petechiae, No rashes, no synovitis; no cyanosis or clubbing  Data Reviewed: Basic Metabolic Panel:  Recent Labs Lab 01/22/15 0404 01/23/15 0421 01/24/15 0419 01/25/15 0824 01/26/15 0354 01/26/15 0920  NA 140 140 138 139 138  --   K 4.4 4.4 4.9 4.7 4.7  --   CL 107 106 102 102 100*  --   CO2 '26 26 28 28 30  '$ --   GLUCOSE 104* 100* 108* 109* 112*  --   BUN 22* 20 22* 22* 22*  --   CREATININE 1.42* 1.44* 1.51* 1.59* 1.68*  --   CALCIUM 9.1 9.1 9.0 9.2 9.3  --   MG  --  2.1 2.1  --   --  2.3   Liver Function Tests:  Recent Labs Lab 01/21/15 0825  AST 48*  ALT 22  ALKPHOS 99  BILITOT 0.5  PROT 6.3*  ALBUMIN 3.2*   No results for input(s): LIPASE, AMYLASE in the last 168  hours. No results for input(s): AMMONIA in the last 168 hours. CBC:  Recent Labs Lab 01/21/15 0825 01/22/15 0114 01/22/15 0404  WBC 15.8* 16.8* 16.9*  HGB 7.2* 8.1* 8.0*  HCT 24.2* 26.7* 26.0*  MCV 81.8 81.4 81.3  PLT 576* 550* 578*   Cardiac Enzymes:  Recent Labs Lab 01/21/15 1434 01/21/15 1807 01/22/15 0114  TROPONINI <0.03 <0.03 <0.03   BNP: Invalid input(s): POCBNP CBG:  Recent Labs Lab 01/25/15 0615 01/25/15 1122 01/25/15 1651 01/25/15 2120 01/26/15 0552  GLUCAP 105* 166* 128* 119* 105*    No results found for this or any previous visit (from the past 240 hour(s)).   Scheduled Meds: . sodium chloride   Intravenous Once  . antiseptic oral rinse  7 mL Mouth Rinse BID  . aspirin  325 mg Oral Daily  . carvedilol  3.125 mg Oral BID WC  . feeding supplement (ENSURE ENLIVE)  237 mL Oral BID BM  . fluticasone  2 spray Each Nare Daily  . furosemide  80 mg Intravenous BID  . Gerhardt's butt cream  Topical TID  . heparin  5,000 Units Subcutaneous 3 times per day  . insulin aspart  0-15 Units Subcutaneous TID WC  . insulin aspart  0-5 Units Subcutaneous QHS  . metolazone  2.5 mg Oral Daily  . potassium chloride  20 mEq Oral Daily  . sodium chloride  3 mL Intravenous Q12H   Continuous Infusions:    Thurnell Lose M.D on 01/26/2015 at 11:16 AM  Between 7am to 7pm - Pager - [phone], After 7pm go to [email or web address] - password [email or web address]  [phone]   LOS: 5 days

## 2015-01-26 NOTE — Progress Notes (Signed)
Advanced Heart Failure Rounding Note  Primary Physician: Dr [name] Primary Cardiologist: Dr Johnsie Cancel HF: Bensimhon  Subjective:    Feels good today.  Says swelling is coming down but slowly.  Continues to have SOB with ambulation.     Out 1.8 L and down 3 lbs with 80 mg IV lasix BID and 2.5 mg metolazone. Cr slowly increasing.  Lisinopril was not started.  Objective:   Weight Range: 114.9 kg (253 lb 4.9 oz) Body mass index is 31.66 kg/(m^2).   Vital Signs:   Temp:  [98.3 F (36.8 C)] 98.3 F (36.8 C) (09/10 1434) Pulse Rate:  [78-83] 78 (09/10 1434) Resp:  [18-20] 20 (09/10 1434) BP: (112-140)/(70-74) 121/70 mmHg (09/10 1434) SpO2:  [86 %-98 %] 92 % (09/10 1434) Weight:  [114.9 kg (253 lb 4.9 oz)] 114.9 kg (253 lb 4.9 oz) (09/10 0536) Last BM Date: 01/23/15  Weight change: Filed Weights   01/24/15 0543 01/25/15 0603 01/26/15 0536  Weight: 117.436 kg (258 lb 14.4 oz) 116.257 kg (256 lb 4.8 oz) 114.9 kg (253 lb 4.9 oz)    Intake/Output:   Intake/Output Summary (Last 24 hours) at 01/26/15 1553 Last data filed at 01/26/15 1435  Gross per 24 hour  Intake   1200 ml  Output   2950 ml  Net  -1750 ml     Physical Exam:  General: Chronically ill and elderly appearing. Reclined in chair with feet up. HEENT: normal, 02 via [place] Neck: supple. JVP 12-14 cm+. Carotids 2+ bilat; no bruits. No lymphadenopathy or thryomegaly. Cor: PMI nondisplaced. Regular rate & rhythm. No rubs, gallops or murmurs. Lungs: Diminished bibasilar Abdomen: Obese, soft, NT, ND. No hepatosplenomegaly. No bruits or masses. +BS Extremities: no cyanosis, clubbing, rash. 2+ edema to thighs. Chronic venous stasis.  Neuro: alert & orientedx3, cranial nerves grossly intact. moves all 4 extremities w/o difficulty. Affect pleasant  Telemetry: NSR 70s  Labs: CBC No results for input(s): WBC, NEUTROABS, HGB, HCT, MCV, PLT in the last 72 hours. Basic Metabolic Panel  Recent Labs  01/24/15 0419  01/25/15 0824 01/26/15 0354 01/26/15 0920  NA 138 139 138  --   K 4.9 4.7 4.7  --   CL 102 102 100*  --   CO2 '28 28 30  '$ --   GLUCOSE 108* 109* 112*  --   BUN 22* 22* 22*  --   CALCIUM 9.0 9.2 9.3  --   MG 2.1  --   --  2.3   Liver Function Tests No results for input(s): AST, ALT, ALKPHOS, BILITOT, PROT, ALBUMIN in the last 72 hours. No results for input(s): LIPASE, AMYLASE in the last 72 hours. Cardiac Enzymes No results for input(s): CKTOTAL, CKMB, CKMBINDEX, TROPONINI in the last 72 hours.  BNP: BNP (last 3 results)  Recent Labs  06/16/14 1940 06/20/14 0538 01/21/15 0825  BNP 18.8 227.8* 518.8*    ProBNP (last 3 results)  Recent Labs  03/29/14 1549 04/14/14 2335  PROBNP 6628.0* 4369.0*     D-Dimer No results for input(s): DDIMER in the last 72 hours. Hemoglobin A1C No results for input(s): HGBA1C in the last 72 hours. Fasting Lipid Panel No results for input(s): CHOL, HDL, LDLCALC, TRIG, CHOLHDL, LDLDIRECT in the last 72 hours. Thyroid Function Tests No results for input(s): TSH, T4TOTAL, T3FREE, THYROIDAB in the last 72 hours.  Invalid input(s): FREET3  Other results:   Imaging/Studies:  No results found.  Latest Echo  Latest Cath   Medications:  Scheduled Medications: . sodium chloride   Intravenous Once  . antiseptic oral rinse  7 mL Mouth Rinse BID  . aspirin  325 mg Oral Daily  . carvedilol  3.125 mg Oral BID WC  . feeding supplement (ENSURE ENLIVE)  237 mL Oral BID BM  . fluticasone  2 spray Each Nare Daily  . furosemide  80 mg Intravenous BID  . Gerhardt's butt cream   Topical TID  . heparin  5,000 Units Subcutaneous 3 times per day  . insulin aspart  0-15 Units Subcutaneous TID WC  . insulin aspart  0-5 Units Subcutaneous QHS  . metolazone  2.5 mg Oral Daily  . potassium chloride  20 mEq Oral Daily  . sodium chloride  3 mL Intravenous Q12H    Infusions:    PRN Medications: sodium chloride, acetaminophen,  guaiFENesin-dextromethorphan, HYDROcodone-acetaminophen, ondansetron (ZOFRAN) IV, senna-docusate, sodium chloride   Assessment/Plan   1. Acute on Chronic diastolic CHF, EF 62% echo 01/21/15, NYHA class III - Normal myoview 2007 2. OSA 3. CKD stage III - Baseline 1.4 4. HTN 5. DM2 6. 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  Does not want further investigation or treatment.   He remains quite overloaded with 20+ lbs from baseline. Continue 80 mg IV lasix BID with metolazone.  Creatinine and BUN slowly rising. May need to slow down vs hold a day if Cr continues to rise.  Will continue to hold lisinopril as creatinine is rising with diuresis and patient remains very volume overloaded.  Agree with starting carvedilol instead of metoprolol.  EF newly decreased on echo this admission.  Cardiac cath has been discussed, he does not want cath.  He is not having chest pain.   Education and improving his insight into his health problems will likely continue to be an issue.  Will need close follow up in HF clinic. He is at a very high risk for readmission.  Dispo: Still 20+ lbs from baseline. Continue IV lasix with metolazone. Likely home early next week. PT originally recommended SNF but pt refused.  Has had some improvement and now recommend [facility] PT with supervision for mobility/OOB.  Length of Stay: [place] 01/26/2015 3:53 PM

## 2015-01-27 LAB — BASIC METABOLIC PANEL
Anion gap: 12 (ref 5–15)
BUN: 27 mg/dL — AB (ref 6–20)
CALCIUM: 9.5 mg/dL (ref 8.9–10.3)
CO2: 25 mmol/L (ref 22–32)
Chloride: 101 mmol/L (ref 101–111)
Creatinine, Ser: 1.77 mg/dL — ABNORMAL HIGH (ref 0.61–1.24)
GFR calc Af Amer: 42 mL/min — ABNORMAL LOW (ref >60)
GFR, EST NON AFRICAN AMERICAN: 36 mL/min — AB (ref >60)
GLUCOSE: 85 mg/dL (ref 65–99)
Potassium: 5.9 mmol/L — ABNORMAL HIGH (ref 3.5–5.1)
SODIUM: 138 mmol/L (ref 135–145)

## 2015-01-27 LAB — GLUCOSE, CAPILLARY
Glucose-Capillary: 94 mg/dL (ref 65–99)
Glucose-Capillary: 110 mg/dL — ABNORMAL HIGH (ref 65–99)
Glucose-Capillary: 115 mg/dL — ABNORMAL HIGH (ref 65–99)
Glucose-Capillary: 102 mg/dL — ABNORMAL HIGH (ref 65–99)

## 2015-01-27 LAB — MAGNESIUM: Magnesium: 2.2 mg/dL (ref 1.7–2.4)

## 2015-01-27 MED ORDER — HYDRALAZINE HCL 10 MG PO TABS
10.0000 mg | ORAL_TABLET | Freq: Three times a day (TID) | ORAL | Status: DC
  Administered 2015-01-27 (×3): 10 mg via ORAL
  Filled 2015-01-27 (×3): qty 1

## 2015-01-27 MED ORDER — ISOSORBIDE DINITRATE 10 MG PO TABS
5.0000 mg | ORAL_TABLET | Freq: Three times a day (TID) | ORAL | Status: DC
  Administered 2015-01-27 (×3): 5 mg via ORAL
  Filled 2015-01-27 (×3): qty 1

## 2015-01-27 MED ORDER — SODIUM POLYSTYRENE SULFONATE 15 GM/60ML PO SUSP
30.0000 g | Freq: Once | ORAL | Status: AC
  Administered 2015-01-27: 30 g via ORAL
  Filled 2015-01-27: qty 120

## 2015-01-27 MED ORDER — CARVEDILOL 3.125 MG PO TABS
3.1250 mg | ORAL_TABLET | Freq: Two times a day (BID) | ORAL | Status: DC
  Administered 2015-01-27 – 2015-01-29 (×5): 3.125 mg via ORAL
  Filled 2015-01-27 (×5): qty 1

## 2015-01-27 MED ORDER — FUROSEMIDE 40 MG PO TABS
40.0000 mg | ORAL_TABLET | Freq: Three times a day (TID) | ORAL | Status: DC
  Administered 2015-01-28 – 2015-01-29 (×4): 40 mg via ORAL
  Filled 2015-01-27 (×4): qty 1

## 2015-01-27 MED ORDER — FUROSEMIDE 40 MG PO TABS
40.0000 mg | ORAL_TABLET | Freq: Three times a day (TID) | ORAL | Status: DC
Start: 2015-01-27 — End: 2015-01-27

## 2015-01-27 NOTE — Progress Notes (Signed)
PROGRESS NOTE  Juan [name] FTD:[id number] DOB: 06-19-1940 DOA: 01/21/2015 PCP: [name], MD  Brief History  74 y.o. male, with dCHF, OSA, HTN, DM2, and myeloproliferative disorder presents with SOB that has been worsening over the last 3 weeks. [name] reports that he lives at home with his son. 3 weeks ago his legs started swelling and he developed a cough with clear sputum. He is unable to lie flat at home and sleeps in his hospital bed with the [facility] elevated. Echocardiogram shows 20% drop in the patient's EF over the past 10 months. Heart failure team was consulted to assist with management.   Subjective:  Patient in chair, no headache or fevers. No chest pain. No shortness of breath. No abdominal pain or discomfort. No focal weakness. His swelling is coming down. He feels better.   Assessment/Plan:  Acute Hypoxic respiratory failure, acute on chronic systolic and diastolic heart failure last EF 30% on echogram this admission.  -Cardiology on board, has diuresed 15 L by 01/27/2015, switched to oral Lasix as creatinine is down rising on 01/27/2015. Hold Zaroxolyn for now. Holding ACE/ARB since renal function has declined. Continue fluid and salt restriction. Dry weight appears to be 226 pounds.  Filed Weights   01/25/15 0603 01/26/15 0536 01/27/15 0449  Weight: 116.257 kg (256 lb 4.8 oz) 114.9 kg (253 lb 4.9 oz) 113.4 kg (250 lb)    Few beats of asymptomatic nonsustained V. tach on 01/25/2015 11 PM.  Electrolytes stable, EF is under 30%, on beta blocker, and's renal function worsening holding ACE inhibitor. Cardiology on board will monitor.  Pulmonary hypertension. Could be due to left heart failure. Pulmonary artery pressure 59. Also could be element of sleep apnea. -Plan as above. Outpatient follow-up with pulmonary and cardiology post discharge.  Symptomatic Anemia -Hgb dropped from 9.5 -> 7.2 in 3 months.Guiac negative.This is likely due to progression of the patient's  myelodysplastic syndrome, see 1 unit of packed RBC on 01/21/2015. H&H stable.  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 Out Patient oncology follow-up.  Bilateral Lower extremity edema -due to CHF, dopplers  neg DVT.Diurese per Cards.  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  Ascending thoracic Aorta aneurysm  Last checked in 03/2014. 4.3 cm in diameter, needs follow up scan outpatient for monitoring.  Hyperkalemia. hold oral potassium, Kayexalate, repeat BMP in the morning.    Family Communication:   Pt at beside Disposition Plan: Patient refuses skilled nursing facility, poor insight into medical situation;  Several more days in hospital DVT prophylaxis. Heparin  Procedures/Studies: Dg Chest 2 View  01/21/2015   CLINICAL DATA:  Chest pain, shortness of breath and cough for 3 weeks. History of CHF, hypertension and diabetes  EXAM: CHEST  2 VIEW  COMPARISON:  06/16/2014; chest CT- 06/21/2014  FINDINGS: Grossly unchanged enlarged cardiac silhouette and mediastinal contours given persistently reduced volumes. Pulmonary vasculature appears indistinct with cephalization of flow. Grossly unchanged bilateral infrahilar heterogeneous opacities, right greater than left. There is persistent moderate elevation of the right hemidiaphragm. No pleural effusion or pneumothorax. No evidence of edema. No acute osseus abnormalities. Re- demonstrated diffuse increased sclerosis of the osseous structures  compatible with provided history of myeloproliferative disease.  IMPRESSION: 1. Suspected mild pulmonary edema on this hypoventilated  examination. No definite pleural effusions. 2. Persistent moderate elevation of the right hemidiaphragm with associated bilateral infrahilar opacities, likely atelectasis. 3. Unchanged diffuse increased sclerosis of the imaged osseous structures compatible with provided history of myeloproliferative disease.   Electronically Signed   By: Sandi Mariscal M.D.   On: 01/21/2015 08:52       Objective: Filed Vitals:   01/26/15 1434 01/26/15 2021 01/27/15 0449 01/27/15 0956  BP: 121/70 141/71 134/71 140/71  Pulse: 78 82 86 86  Temp: 98.3 F (36.8 C) 97.8 F (36.6 C) 98 F (36.7 C) 97.8 F (36.6 C)  TempSrc: Oral Oral Oral Oral  Resp: '20 18 18 18  '$ Height:      Weight:   113.4 kg (250 lb)   SpO2: 92% 94% 95% 88%    Intake/Output Summary (Last 24 hours) at 01/27/15 1043 Last data filed at 01/27/15 1019  Gross per 24 hour  Intake    960 ml  Output   3075 ml  Net  -2115 ml   Weight change: -1.5 kg (-3 lb 4.9 oz) Exam:   General:  Pt is alert, follows commands appropriately, not in acute distress  HEENT: No icterus, No thrush, No neck mass, [place]/AT  Cardiovascular: RRR, S1/S2, no rubs, no gallops  Respiratory: Fine bibasilar crackles. No wheeze.   Abdomen: Soft/+BS, non tender, non distended, no guarding; no hepatosplenomegaly.  Extremities:  2+ LE edema, No lymphangitis, No petechiae, No rashes, no synovitis; no cyanosis or clubbing  Data Reviewed: Basic Metabolic Panel:  Recent Labs Lab 01/23/15 0421 01/24/15 0419 01/25/15 0824 01/26/15 0354 01/26/15 0920 01/27/15 0513  NA 140 138 139 138  --  138  K 4.4 4.9 4.7 4.7  --  5.9*  CL 106 102 102 100*  --  101  CO2 '26 28 28 30  '$ --  25  GLUCOSE 100* 108* 109* 112*  --  85  BUN 20 22* 22* 22*  --  27*  CREATININE 1.44* 1.51* 1.59* 1.68*  --  1.77*  CALCIUM 9.1 9.0 9.2 9.3  --  9.5  MG 2.1 2.1  --   --  2.3 2.2   Liver Function Tests:  Recent Labs Lab 01/21/15 0825  AST 48*  ALT 22  ALKPHOS 99  BILITOT 0.5  PROT  6.3*  ALBUMIN 3.2*   No results for input(s): LIPASE, AMYLASE in the last 168 hours. No results for input(s): AMMONIA in the last 168 hours. CBC:  Recent Labs Lab 01/21/15 0825 01/22/15 0114 01/22/15 0404  WBC 15.8* 16.8* 16.9*  HGB 7.2* 8.1* 8.0*  HCT 24.2* 26.7* 26.0*  MCV 81.8 81.4 81.3  PLT 576* 550* 578*   Cardiac Enzymes:  Recent Labs Lab 01/21/15 1434 01/21/15 1807 01/22/15 0114  TROPONINI <0.03 <0.03 <0.03   BNP: Invalid input(s): POCBNP CBG:  Recent Labs Lab 01/26/15 0552 01/26/15 1113 01/26/15 1615 01/26/15 2108 01/27/15 0631  GLUCAP 105* 94 100* 135* 94    No results found for this or any previous visit (from the past 240 hour(s)).   Scheduled Meds: . sodium chloride   Intravenous Once  . antiseptic oral rinse  7 mL Mouth Rinse BID  . aspirin  325 mg Oral Daily  . carvedilol  3.125 mg Oral BID WC  . feeding supplement (ENSURE ENLIVE)  237 mL Oral BID BM  . fluticasone  2 spray Each Nare Daily  .  furosemide  40 mg Oral TID  . Gerhardt's butt cream   Topical TID  . heparin  5,000 Units Subcutaneous 3 times per day  . hydrALAZINE  10 mg Oral TID  . insulin aspart  0-15 Units Subcutaneous TID WC  . insulin aspart  0-5 Units Subcutaneous QHS  . isosorbide dinitrate  5 mg Oral TID  . sodium polystyrene  30 g Oral Once   Continuous Infusions:    Thurnell Lose M.D on 01/27/2015 at 10:43 AM  Between 7am to 7pm - Pager - [phone], After 7pm go to [email or web address] - password [email or web address]  [phone]   LOS: 6 days

## 2015-01-27 NOTE — Progress Notes (Signed)
Advanced Heart Failure Rounding Note  Primary Physician: Dr [name] Primary Cardiologist: Dr Johnsie Cancel HF: Bensimhon  Subjective:    Feels good today.  Says swelling is coming down but slowly.  Continues to have SOB with ambulation.    Out 1.5 L and down 3 lbs with 80 mg IV lasix BID and 2.5 mg metolazone. Cr slowly increasing.  Lisinopril was not started.  Objective:   Weight Range: 113.4 kg (250 lb) Body mass index is 31.25 kg/(m^2).   Vital Signs:   Temp:  [97.8 F (36.6 C)-98.3 F (36.8 C)] 98 F (36.7 C) (09/11 0449) Pulse Rate:  [78-86] 86 (09/11 0449) Resp:  [18-20] 18 (09/11 0449) BP: (121-141)/(70-71) 134/71 mmHg (09/11 0449) SpO2:  [92 %-95 %] 95 % (09/11 0449) Weight:  [113.4 kg (250 lb)] 113.4 kg (250 lb) (09/11 0449) Last BM Date: 01/23/15  Weight change: Filed Weights   01/25/15 0603 01/26/15 0536 01/27/15 0449  Weight: 116.257 kg (256 lb 4.8 oz) 114.9 kg (253 lb 4.9 oz) 113.4 kg (250 lb)    Intake/Output:   Intake/Output Summary (Last 24 hours) at 01/27/15 0814 Last data filed at 01/27/15 0600  Gross per 24 hour  Intake    960 ml  Output   2550 ml  Net  -1590 ml     Physical Exam:  General: Chronically ill and elderly appearing. Reclined in chair with feet down HEENT: normal, 02 via [place] Neck: supple. JVP 12-14 cm. Carotids 2+ bilat; no bruits. No lymphadenopathy or thryomegaly. Cor: PMI nondisplaced. Regular rate & rhythm. No rubs, gallops or murmurs. Lungs: Diminished bibasilar Abdomen: Obese, soft, NT, ND. No hepatosplenomegaly. No bruits or masses. +BS Extremities: no cyanosis, clubbing, rash. 2+ edema to knees. Chronic venous stasis.  Neuro: alert & orientedx3, cranial nerves grossly intact. moves all 4 extremities w/o difficulty. Affect pleasant  Telemetry: NSR 70s  Labs: CBC No results for input(s): WBC, NEUTROABS, HGB, HCT, MCV, PLT in the last 72 hours. Basic Metabolic Panel  Recent Labs  01/26/15 0354 01/26/15 0920  01/27/15 0513  NA 138  --  138  K 4.7  --  5.9*  CL 100*  --  101  CO2 30  --  25  GLUCOSE 112*  --  85  BUN 22*  --  27*  CALCIUM 9.3  --  9.5  MG  --  2.3 2.2   Liver Function Tests No results for input(s): AST, ALT, ALKPHOS, BILITOT, PROT, ALBUMIN in the last 72 hours. No results for input(s): LIPASE, AMYLASE in the last 72 hours. Cardiac Enzymes No results for input(s): CKTOTAL, CKMB, CKMBINDEX, TROPONINI in the last 72 hours.  BNP: BNP (last 3 results)  Recent Labs  06/16/14 1940 06/20/14 0538 01/21/15 0825  BNP 18.8 227.8* 518.8*    ProBNP (last 3 results)  Recent Labs  03/29/14 1549 04/14/14 2335  PROBNP 6628.0* 4369.0*     D-Dimer No results for input(s): DDIMER in the last 72 hours. Hemoglobin A1C No results for input(s): HGBA1C in the last 72 hours. Fasting Lipid Panel No results for input(s): CHOL, HDL, LDLCALC, TRIG, CHOLHDL, LDLDIRECT in the last 72 hours. Thyroid Function Tests No results for input(s): TSH, T4TOTAL, T3FREE, THYROIDAB in the last 72 hours.  Invalid input(s): FREET3  Other results:   Imaging/Studies:  No results found.  Latest Echo: EF 30% with diffuse hypokinesis, worse in inferior wall.  Moderate LVH.  Trivial AR, mild MR.  PASP 59 mmHg.  Latest Cath   Medications:  Scheduled Medications: . sodium chloride   Intravenous Once  . antiseptic oral rinse  7 mL Mouth Rinse BID  . aspirin  325 mg Oral Daily  . carvedilol  3.125 mg Oral BID WC  . feeding supplement (ENSURE ENLIVE)  237 mL Oral BID BM  . fluticasone  2 spray Each Nare Daily  . furosemide  80 mg Intravenous BID  . Gerhardt's butt cream   Topical TID  . heparin  5,000 Units Subcutaneous 3 times per day  . insulin aspart  0-15 Units Subcutaneous TID WC  . insulin aspart  0-5 Units Subcutaneous QHS  . metolazone  2.5 mg Oral Daily  . potassium chloride  20 mEq Oral Daily  . sodium chloride  3 mL Intravenous Q12H    Infusions:    PRN  Medications: sodium chloride, acetaminophen, guaiFENesin-dextromethorphan, HYDROcodone-acetaminophen, ondansetron (ZOFRAN) IV, senna-docusate, sodium chloride   Assessment/Plan   1. Acute on Chronic diastolic CHF, EF 33% echo 01/21/15, NYHA class III - Normal myoview 2007 2. OSA 3. CKD stage III - Baseline 1.4 4. HTN 5. DM2 6. 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  Does not want further investigation or treatment.   He remains quite overloaded with 20+ lbs from baseline. Continue 80 mg IV lasix BID with metolazone.  Creatinine and BUN slowly rising.  Will hold PM lasix and metolazone and likely restart tomorrow.  Creatinine was 1.2 in June.  Will start low dose hydralazine and nitrates to improve afterload while he is off ACE-I.  Agree with starting carvedilol instead of metoprolol.  EF newly decreased on echo this admission.  Cardiac cath has been discussed, he does not want cath.  He is not having chest pain.   Education and improving his insight into his health problems will likely continue to be an issue.  Will need close follow up in HF clinic. He is at a very high risk for readmission.  Mr. Mineer was advised not to keep his feet down while sitting in the recliner.  Dispo: Still 20+ lbs from baseline.  Hold lasix and metolazone this afternoon. Likely home next week. PT originally recommended SNF but pt refused.  Has had some improvement and now recommend [facility] PT with supervision for mobility/OOB.  Length of Stay: 6    Sharol Harness 01/27/2015 8:14 AM

## 2015-01-27 NOTE — Progress Notes (Signed)
Came to ask pt if he wants to wear CPAP, pt refuses CPAP. Pt is very irritated in the fact that nightly RT's come to ask him if he wants to wear NIV. Pt stated that his Doctor took him off the NIV because it gives him frequent colds/sickness. Pt stated that he wishes they will take him off the CPAP list, pt is irritable in this regard. No complications noted. Pt is stable at  This time.

## 2015-01-28 ENCOUNTER — Ambulatory Visit: Payer: Self-pay | Admitting: Hematology & Oncology

## 2015-01-28 ENCOUNTER — Other Ambulatory Visit: Payer: Medicare Other

## 2015-01-28 ENCOUNTER — Ambulatory Visit: Payer: Self-pay | Admitting: Internal Medicine

## 2015-01-28 ENCOUNTER — Telehealth: Payer: Self-pay | Admitting: Hematology & Oncology

## 2015-01-28 LAB — GLUCOSE, CAPILLARY
GLUCOSE-CAPILLARY: 107 mg/dL — AB (ref 65–99)
GLUCOSE-CAPILLARY: 122 mg/dL — AB (ref 65–99)
GLUCOSE-CAPILLARY: 107 mg/dL — AB (ref 65–99)
Glucose-Capillary: 105 mg/dL — ABNORMAL HIGH (ref 65–99)

## 2015-01-28 LAB — BASIC METABOLIC PANEL
ANION GAP: 8 (ref 5–15)
BUN: 25 mg/dL — ABNORMAL HIGH (ref 6–20)
CALCIUM: 9.2 mg/dL (ref 8.9–10.3)
CHLORIDE: 103 mmol/L (ref 101–111)
CO2: 30 mmol/L (ref 22–32)
Creatinine, Ser: 1.67 mg/dL — ABNORMAL HIGH (ref 0.61–1.24)
GFR calc non Af Amer: 39 mL/min — ABNORMAL LOW (ref >60)
GFR, EST AFRICAN AMERICAN: 45 mL/min — AB (ref >60)
Glucose, Bld: 108 mg/dL — ABNORMAL HIGH (ref 65–99)
Potassium: 4.1 mmol/L (ref 3.5–5.1)
SODIUM: 141 mmol/L (ref 135–145)

## 2015-01-28 LAB — MAGNESIUM: Magnesium: 2.2 mg/dL (ref 1.7–2.4)

## 2015-01-28 MED ORDER — ISOSORB DINITRATE-HYDRALAZINE 20-37.5 MG PO TABS
1.0000 | ORAL_TABLET | Freq: Three times a day (TID) | ORAL | Status: DC
  Administered 2015-01-28 – 2015-01-29 (×4): 1 via ORAL
  Filled 2015-01-28 (×4): qty 1

## 2015-01-28 MED ORDER — HYDROCORTISONE 1 % EX CREA
TOPICAL_CREAM | Freq: Three times a day (TID) | CUTANEOUS | Status: DC
  Administered 2015-01-28 – 2015-01-29 (×2): via TOPICAL
  Filled 2015-01-28: qty 28

## 2015-01-28 NOTE — Progress Notes (Signed)
BIDIL 30 day voucher given to patient also prescription savings card ( for possible cost up to $25 per month). Mindi Slicker [facility] [phone]

## 2015-01-28 NOTE — Progress Notes (Signed)
Occupational Therapy Treatment Patient Details Name: Juan [name] MRN: [id number] DOB: March 14, 1941 Today's Date: 01/28/2015    History of present illness Juan [name] is a 74 y.o. male, with DHF, OSA, HTN, DM2, and myeloproliferative disorder presents with SOB that has been worsening over the last 3 weeks. [name] reports that he lives at home with his son. 3 weeks ago his legs started swelling and he developed a cough with clear sputum. He is unable to lie flat at home and sleeps in his hospital bed with the [facility] elevated. Pt with CHF and pulmonary edema   OT comments  Pt progressing. Education provided in session.   Follow Up Recommendations  Home health OT    Equipment Recommendations  None recommended by OT    Recommendations for Other Services      Precautions / Restrictions Precautions Precautions: Fall Precaution Comments: watch 02 sats Restrictions Weight Bearing Restrictions: No       Mobility Bed Mobility Overal bed mobility: Needs Assistance Bed Mobility: Supine to Sit;Sit to Supine     Supine to sit: Supervision Sit to supine: Modified independent (Device/Increase time)   General bed mobility comments: cues for scooting HOB  Transfers Overall transfer level: Needs assistance     Sit to Stand: Supervision         General transfer comment: used RW for support upon standing    Balance   Used RW for ambulation in session-Min guard.                                 ADL Overall ADL's : Needs assistance/impaired     Grooming: Sitting;Standing;Set up;Supervision/safety;Wash/dry face;Oral care       Lower Body Bathing: Supervison/ safety;Set up;Sit to/from stand Lower Body Bathing Details (indicate cue type and reason): washed bottom     Lower Body Dressing: Set up;Sitting/lateral leans Lower Body Dressing Details (indicate cue type and reason): donned/doffed socks Toilet Transfer: Supervision/safety;Min guard;Ambulation;RW;BSC (Min  guard-ambulation; supervision for sit to stand)           Functional mobility during ADLs: Min guard;Rolling walker General ADL Comments: Educated on energy conservation techniques.       Vision                     Perception     Praxis      Cognition  Lethargic Behavior During Therapy: WFL for tasks assessed/performed Overall Cognitive Status: No family/caregiver present to determine baseline cognitive functioning                       Extremity/Trunk Assessment               Exercises     Shoulder Instructions       General Comments      Pertinent Vitals/ Pain       Pain Assessment: No/denies pain; Pt on O2 in session and O2 in 90s towards beginning of session.  Home Living                                          Prior Functioning/Environment              Frequency Min 2X/week     Progress Toward Goals  OT Goals(current goals can now be found in the  care plan section)  Progress towards OT goals: Progressing toward goals-updated goal due to progress  Acute Rehab OT Goals Patient Stated Goal: not stated OT Goal Formulation: With patient Time For Goal Achievement: 02/07/15 Potential to Achieve Goals: Good ADL Goals Pt Will Perform Grooming: with supervision;standing (3 activities) Pt Will Perform Upper Body Bathing: with set-up;sitting Pt Will Perform Lower Body Bathing: with supervision;sit to/from stand (including gathering items) Pt Will Perform Upper Body Dressing: with set-up;sitting Pt Will Perform Lower Body Dressing: with supervision;sit to/from stand Pt Will Transfer to Toilet: with supervision;ambulating;bedside commode Pt Will Perform Toileting - Clothing Manipulation and hygiene: with supervision;sit to/from stand  Plan Discharge plan remains appropriate    Co-evaluation                 End of Session Equipment Utilized During Treatment: Gait belt;Rolling walker;Oxygen   Activity  Tolerance Patient limited by lethargy;Patient limited by fatigue   Patient Left in bed;with call [place]/phone within reach;with bed alarm set;Other (comment) (tech in room)   Nurse Communication          Time: [phone] OT Time Calculation (min): 16 min  Charges: OT General Charges $OT Visit: 1 Procedure OT Treatments $Self Care/Home Management : 8-22 mins  Benito Mccreedy OTR/L [phone] 01/28/2015, 4:23 PM

## 2015-01-28 NOTE — Care Management Important Message (Signed)
Important Message  Patient Details  Name: Juan [name] MRN: [id number] Date of Birth: 04/10/1941   Medicare Important Message Given:  Yes-second notification given    Loann Quill 01/28/2015, 12:49 PM

## 2015-01-28 NOTE — Progress Notes (Signed)
Advanced Heart Failure Rounding Note  Primary Physician: Dr [name] Primary Cardiologist: Dr Johnsie Cancel HF: Juan [name]  Subjective:    Greatly improved from admit. Breathing feels a lot better. Down 30 pounds.  Wants to stay in hospital as long as he can. Wants to get as much fluid off as possible so he doesn't "leave through the front door, and come right back in the back door."  Out 1.1 L and down 5 lbs with one dose of 80 mg IV lasix yesterday. Cr coming trending back down. 1.77 -> 1.67  Objective:   Weight Range: 245 lb (111.131 kg) Body mass index is 30.62 kg/(m^2).   Vital Signs:   Temp:  [97.4 F (36.3 C)-98.3 F (36.8 C)] 97.8 F (36.6 C) (09/12 0610) Pulse Rate:  [76-86] 77 (09/12 0610) Resp:  [18-20] 20 (09/12 0610) BP: (121-145)/(71-73) 145/73 mmHg (09/12 0610) SpO2:  [88 %-98 %] 96 % (09/12 0610) Weight:  [245 lb (111.131 kg)] 245 lb (111.131 kg) (09/12 0610) Last BM Date: 01/23/15  Weight change: Filed Weights   01/26/15 0536 01/27/15 0449 01/28/15 0610  Weight: 253 lb 4.9 oz (114.9 kg) 250 lb (113.4 kg) 245 lb (111.131 kg)    Intake/Output:   Intake/Output Summary (Last 24 hours) at 01/28/15 0709 Last data filed at 01/28/15 0646  Gross per 24 hour  Intake   1200 ml  Output   2375 ml  Net  -1175 ml     Physical Exam:  General: lying flat in bed. NADn. HEENT: normal, 02 via [place] Neck: supple. JVP 7 cm  Carotids 2+ bilat; no bruits. No lymphadenopathy or thyromegaly noted. Cor: PMI nondisplaced. Regular rate & rhythm. No rubs, gallops or murmurs appreciated. Lungs: Diminished at bases, but improved. Abdomen: Obese, soft, NT, ND. No hepatosplenomegaly. No bruits or masses. +BS Extremities: no cyanosis, clubbing, rash. Trace edema. Severe chronic venous stasis changes.  Neuro: alert & orientedx3, cranial nerves grossly intact. moves all 4 extremities w/o difficulty. Affect pleasant  Telemetry: NSR 70s  Labs: CBC No results for input(s): WBC, NEUTROABS,  HGB, HCT, MCV, PLT in the last 72 hours. Basic Metabolic Panel  Recent Labs  01/27/15 0513 01/28/15 0314  NA 138 141  K 5.9* 4.1  CL 101 103  CO2 25 30  GLUCOSE 85 108*  BUN 27* 25*  CALCIUM 9.5 9.2  MG 2.2 2.2   Liver Function Tests No results for input(s): AST, ALT, ALKPHOS, BILITOT, PROT, ALBUMIN in the last 72 hours. No results for input(s): LIPASE, AMYLASE in the last 72 hours. Cardiac Enzymes No results for input(s): CKTOTAL, CKMB, CKMBINDEX, TROPONINI in the last 72 hours.  BNP: BNP (last 3 results)  Recent Labs  06/16/14 1940 06/20/14 0538 01/21/15 0825  BNP 18.8 227.8* 518.8*    ProBNP (last 3 results)  Recent Labs  03/29/14 1549 04/14/14 2335  PROBNP 6628.0* 4369.0*     D-Dimer No results for input(s): DDIMER in the last 72 hours. Hemoglobin A1C No results for input(s): HGBA1C in the last 72 hours. Fasting Lipid Panel No results for input(s): CHOL, HDL, LDLCALC, TRIG, CHOLHDL, LDLDIRECT in the last 72 hours. Thyroid Function Tests No results for input(s): TSH, T4TOTAL, T3FREE, THYROIDAB in the last 72 hours.  Invalid input(s): FREET3  Other results:     Imaging/Studies:  No results found.  Latest Echo  Latest Cath   Medications:     Scheduled Medications: . sodium chloride   Intravenous Once  . antiseptic oral rinse  7 mL  Mouth Rinse BID  . aspirin  325 mg Oral Daily  . carvedilol  3.125 mg Oral BID WC  . feeding supplement (ENSURE ENLIVE)  237 mL Oral BID BM  . fluticasone  2 spray Each Nare Daily  . furosemide  40 mg Oral TID  . Gerhardt's butt cream   Topical TID  . heparin  5,000 Units Subcutaneous 3 times per day  . hydrALAZINE  10 mg Oral TID  . insulin aspart  0-15 Units Subcutaneous TID WC  . insulin aspart  0-5 Units Subcutaneous QHS  . isosorbide dinitrate  5 mg Oral TID    Infusions:    PRN Medications: acetaminophen, guaiFENesin-dextromethorphan, HYDROcodone-acetaminophen, ondansetron (ZOFRAN) IV,  senna-docusate   Assessment/Plan   1. Acute on Chronic diastolic CHF, EF 09% echo 01/21/15, NYHA class III - Normal myoview 2007 2. OSA 3. CKD stage III - Baseline 1.4 4. HTN 5. DM2 6. 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  Does not want further investigation or treatment.   Transitioning to po lasix today at 40 mg TID.  Was previously on 40 mg lasix BID at home.  Will likely need at least 60 mg BID at home +/- metolazone as needed.  ACEI held with increasing Cr over weekend.  He is down 30 lbs overall this admission.  Transitioning to po today with continued elevation of Cr on IV lasix.  May be able to go home in next day or two with close follow up in HF clinic, especially if continues to diurese on po lasix. Pt is high risk for readmission with limited insight into his HF.  Length of Stay: 7  Juan Friar PA-C 01/28/2015, 7:09 AM  Advanced Heart Failure Team Pager [phone] (M-F; 7a - 4p)  Please contact [facility] for night-coverage after hours (4p -7a ) and weekends on [email or web address]  Patient seen and examined with Juan Kilts, PA-C. We discussed all aspects of the encounter. I agree with the assessment and plan as stated above.   Volume status much improved today. Renal function stable. I feel that he can go home but he wants to stay another day. Would switch lasix to '120mg'$  daily on discharge (doesn't take in am because he urinates on himself if he goes out). Has not tolerated ACE. Now on hydralazine/nitrates. Refuses cath.   Needs assessment for home O2.   D/c meds:  Carvedilol 3.125 bid Lasix '120mg'$  qafternoon  Bidil 1 tab tid (will need Case Manager assistance)  Will f/u in HF clinic. Will get appt on chart.   Juan Tejera,MD 11:19 AM

## 2015-01-28 NOTE — Progress Notes (Signed)
CM CONSULT - for Bidil 1 tablet TID  Patient has Medicare A &B, he does not have part D for prescription Drug coverage. CM talked to patient and he knows that he needs part D but stated that he cannot afford part D. Medication assistance form given to the patient to complete at home to see if he will qualify for their program. CM discussed with the patient the importance of having a Drug plan especially if he is on multiple meds not covered under his Medicare Plan.  [place] called ( patient's pharmacy of choice) Bidil cost $114.00 for a 30 day supply. MD made aware. Mindi Slicker [facility] [phone]

## 2015-01-28 NOTE — Consult Note (Signed)
   [facility] CM Inpatient Consult   01/28/2015  Juan [name] 01/25/1941 [id number] Referral received to assess for care management services.  Met with the patient and son at the bedside regarding the benefits of [facility] Care Management services.  Explained [facility] Care Management services for post hospital follow up support and benefits with his primary care provider.  Patient states, "I don't want to sign up today but, I will take your card and call if the services are needed."  Patient was encouraged to call this writer, if he changes his mind.  Patient consistently talking about his new scale to weigh on and keeping up with his fluid and weight.  Explained how [facility] could assist his in meeting his health care goals.  Patient states he will likely have home health and a new wheelchair.  He also states he has some transportation issues.  Explained how [facility] can help him with his need assessment and care planning but he continues to just want the information.  Brochure and contact information given.  PATIENT DID NOT CONSENT TO [facility] CARE MANAGEMENT. Explained to the patient that [facility] does not interfere with or replace any services set up by the inpatient care management department/team.  For questions, please contact: [name], RN BSN [facility] Liaison  [phone] business mobile phone

## 2015-01-28 NOTE — Progress Notes (Signed)
PROGRESS NOTE  Juan [name]:[id number] DOB: Apr 14, 1941 DOA: 01/21/2015 PCP: [name], MD  Brief History  74 y.o. male, with dCHF, OSA, HTN, DM2, and myeloproliferative disorder presents with SOB that has been worsening over the last 3 weeks. Juan [name] reports that he lives at home with his son. 3 weeks ago his legs started swelling and he developed a cough with clear sputum. He is unable to lie flat at home and sleeps in his hospital bed with the [facility] elevated. Echocardiogram shows 20% drop in the patient's EF over the past 10 months. Heart failure team was consulted to assist with management.   Subjective:  Patient in chair, no headache or fevers. No chest pain. No shortness of breath. No abdominal pain or discomfort. No focal weakness. His swelling is coming down. He feels better.   Assessment/Plan:  Acute Hypoxic respiratory failure, acute on chronic systolic and diastolic heart failure last EF 30% on echogram this admission.  -Cardiology on board, has diuresed 15 L by 01/27/2015, now switched to low-dose oral Lasix as renal function had worsened, renal function improving on present dose Lasix. Holding ACE/ARB since renal function had declined. Continue fluid and salt restriction. Dry weight appears to be 226 pounds.  Filed Weights   01/26/15 0536 01/27/15 0449 01/28/15 0610  Weight: 114.9 kg (253 lb 4.9 oz) 113.4 kg (250 lb) 111.131 kg (245 lb)    Few beats of asymptomatic nonsustained V. tach on 01/25/2015 11 PM.  Electrolytes stable, EF is under 30%, on beta blocker, electrolytes stable. Cardiology on board will monitor.  Pulmonary hypertension. Could be due to left heart failure. Pulmonary artery pressure 59. Also could be element of sleep apnea. -Plan as above. Outpatient follow-up with pulmonary and cardiology post discharge.  Symptomatic Anemia -Hgb dropped from 9.5 -> 7.2 in 3 months.Guiac negative.This is likely due to progression of the patient's  myelodysplastic syndrome, see 1 unit of packed RBC on 01/21/2015. H&H stable.  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 Out Patient oncology follow-up.  Bilateral Lower extremity edema -due to CHF, dopplers  neg DVT.Continue Lasix.  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  Ascending thoracic Aorta aneurysm  Last checked in 03/2014. 4.3 cm in diameter, needs follow up scan outpatient for monitoring.  Hyperkalemia. hold oral potassium, resolved after a dose of Kayexalate, and continue diuresis with Lasix.    Family Communication:   Pt at beside Disposition Plan: Patient refuses skilled nursing facility, poor insight into medical situation;  Several more days in hospital DVT prophylaxis. Heparin  Procedures/Studies: Dg Chest 2 View  01/21/2015   CLINICAL DATA:  Chest pain, shortness of breath and cough for 3 weeks. History of CHF, hypertension and diabetes  EXAM: CHEST  2 VIEW  COMPARISON:  06/16/2014; chest CT- 06/21/2014  FINDINGS: Grossly unchanged enlarged cardiac silhouette and mediastinal contours given persistently reduced volumes. Pulmonary vasculature appears indistinct with cephalization of flow. Grossly unchanged bilateral infrahilar heterogeneous opacities, right greater than left. There is persistent moderate elevation of the right hemidiaphragm. No pleural effusion or pneumothorax. No evidence of edema. No acute osseus abnormalities. Re- demonstrated diffuse increased sclerosis of the  osseous structures compatible with provided history of myeloproliferative disease.  IMPRESSION: 1. Suspected mild  pulmonary edema on this hypoventilated examination. No definite pleural effusions. 2. Persistent moderate elevation of the right hemidiaphragm with associated bilateral infrahilar opacities, likely atelectasis. 3. Unchanged diffuse increased sclerosis of the imaged osseous structures compatible with provided history of myeloproliferative disease.   Electronically Signed   By: Sandi Mariscal M.D.   On: 01/21/2015 08:52       Objective: Filed Vitals:   01/27/15 1408 01/27/15 2149 01/28/15 0610 01/28/15 0822  BP: 121/71 140/73 145/73 143/70  Pulse: 76 82 77 78  Temp: 98.3 F (36.8 C) 97.4 F (36.3 C) 97.8 F (36.6 C) 98.3 F (36.8 C)  TempSrc: Oral Oral Oral Oral  Resp: '20 18 20 18  '$ Height:      Weight:   111.131 kg (245 lb)   SpO2: 98% 97% 96% 97%    Intake/Output Summary (Last 24 hours) at 01/28/15 0949 Last data filed at 01/28/15 0941  Gross per 24 hour  Intake   1080 ml  Output   2450 ml  Net  -1370 ml   Weight change: -2.269 kg (-5 lb) Exam:   General:  Pt is alert, follows commands appropriately, not in acute distress  HEENT: No icterus, No thrush, No neck mass, [place]/AT  Cardiovascular: RRR, S1/S2, no rubs, no gallops  Respiratory: Fine bibasilar crackles. No wheeze.   Abdomen: Soft/+BS, non tender, non distended, no guarding; no hepatosplenomegaly.  Extremities:  2+ LE edema, No lymphangitis, No petechiae, No rashes, no synovitis; no cyanosis or clubbing  Data Reviewed: Basic Metabolic Panel:  Recent Labs Lab 01/23/15 0421 01/24/15 0419 01/25/15 0824 01/26/15 0354 01/26/15 0920 01/27/15 0513 01/28/15 0314  NA 140 138 139 138  --  138 141  K 4.4 4.9 4.7 4.7  --  5.9* 4.1  CL 106 102 102 100*  --  101 103  CO2 '26 28 28 30  '$ --  25 30  GLUCOSE 100* 108* 109* 112*  --  85 108*  BUN 20 22* 22* 22*  --  27* 25*  CREATININE 1.44* 1.51* 1.59* 1.68*  --  1.77* 1.67*  CALCIUM 9.1 9.0 9.2 9.3  --  9.5 9.2  MG 2.1 2.1  --   --  2.3 2.2 2.2   Liver Function  Tests: No results for input(s): AST, ALT, ALKPHOS, BILITOT, PROT, ALBUMIN in the last 168 hours. No results for input(s): LIPASE, AMYLASE in the last 168 hours. No results for input(s): AMMONIA in the last 168 hours. CBC:  Recent Labs Lab 01/22/15 0114 01/22/15 0404  WBC 16.8* 16.9*  HGB 8.1* 8.0*  HCT 26.7* 26.0*  MCV 81.4 81.3  PLT 550* 578*   Cardiac Enzymes:  Recent Labs Lab 01/21/15 1434 01/21/15 1807 01/22/15 0114  TROPONINI <0.03 <0.03 <0.03   BNP: Invalid input(s): POCBNP CBG:  Recent Labs Lab 01/27/15 0631 01/27/15 1131 01/27/15 1613 01/27/15 2146 01/28/15 0615  GLUCAP 94 110* 115* 102* 107*    No results found for this or any previous visit (from the past 240 hour(s)).   Scheduled Meds: . sodium chloride   Intravenous Once  . antiseptic oral rinse  7 mL Mouth Rinse BID  . aspirin  325 mg Oral Daily  . carvedilol  3.125 mg Oral BID WC  . feeding supplement (ENSURE ENLIVE)  237 mL Oral BID BM  . fluticasone  2 spray Each Nare Daily  . furosemide  40 mg Oral TID  .  Gerhardt's butt cream   Topical TID  . heparin  5,000 Units Subcutaneous 3 times per day  . hydrALAZINE  10 mg Oral TID  . insulin aspart  0-15 Units Subcutaneous TID WC  . insulin aspart  0-5 Units Subcutaneous QHS  . isosorbide dinitrate  5 mg Oral TID   Continuous Infusions:    Thurnell Lose M.D on 01/28/2015 at 9:49 AM  Between 7am to 7pm - Pager - [phone], After 7pm go to [email or web address] - password [email or web address]  [phone]   LOS: 7 days

## 2015-01-28 NOTE — Telephone Encounter (Signed)
Patient called and cx 01/28/15 apt due to being in the hospital.  He stated he would call back to resch

## 2015-01-29 ENCOUNTER — Telehealth: Payer: Self-pay | Admitting: Internal Medicine

## 2015-01-29 LAB — GLUCOSE, CAPILLARY
GLUCOSE-CAPILLARY: 117 mg/dL — AB (ref 65–99)
Glucose-Capillary: 95 mg/dL (ref 65–99)

## 2015-01-29 LAB — BASIC METABOLIC PANEL
ANION GAP: 10 (ref 5–15)
BUN: 26 mg/dL — ABNORMAL HIGH (ref 6–20)
CO2: 28 mmol/L (ref 22–32)
Calcium: 8.9 mg/dL (ref 8.9–10.3)
Chloride: 101 mmol/L (ref 101–111)
Creatinine, Ser: 1.59 mg/dL — ABNORMAL HIGH (ref 0.61–1.24)
GFR, EST AFRICAN AMERICAN: 48 mL/min — AB (ref >60)
GFR, EST NON AFRICAN AMERICAN: 41 mL/min — AB (ref >60)
GLUCOSE: 99 mg/dL (ref 65–99)
POTASSIUM: 3.9 mmol/L (ref 3.5–5.1)
SODIUM: 139 mmol/L (ref 135–145)

## 2015-01-29 MED ORDER — CARVEDILOL 3.125 MG PO TABS: 3.1250 mg | ORAL_TABLET | Freq: Two times a day (BID) | ORAL | Status: DC

## 2015-01-29 MED ORDER — FUROSEMIDE 40 MG PO TABS: 40.0000 mg | ORAL_TABLET | Freq: Three times a day (TID) | ORAL | Status: DC

## 2015-01-29 MED ORDER — SPIRONOLACTONE 25 MG PO TABS: 25.0000 mg | ORAL_TABLET | Freq: Every day | ORAL | Status: DC

## 2015-01-29 MED ORDER — ISOSORB DINITRATE-HYDRALAZINE 20-37.5 MG PO TABS: 1.0000 | ORAL_TABLET | Freq: Three times a day (TID) | ORAL | Status: DC

## 2015-01-29 NOTE — Telephone Encounter (Signed)
call from [place], [place], hosp f/u w/in 1 week, CHF

## 2015-01-29 NOTE — Progress Notes (Signed)
Discharge instructions completed.  Medication list reviewed with patient.  Pt verbalizes understanding.

## 2015-01-29 NOTE — Progress Notes (Signed)
Mary with [facility] called and is aware of discharge home today with [facility] services, wheelchair ordered as requested. Mindi Slicker [facility] [phone]

## 2015-01-29 NOTE — Discharge Summary (Signed)
Juan [name], is a 74 y.o. male  DOB 1940/07/27  MRN [id number].  Admission date:  01/21/2015  Admitting Physician  Kelvin Cellar, MD  Discharge Date:  01/29/2015   Primary MD  [name], MD  Recommendations for primary care physician for things to follow:   Monitor weight, BMP and diuretic dose closely, outpatient follow-up with cardiology, history of myelodysplastic syndrome and needs outpatient oncology along with vascular surgery follow-up for AAA.   Admission Diagnosis  CHF exacerbation [I50.9] Anemia, unspecified anemia type [D64.9]   Discharge Diagnosis  CHF exacerbation [I50.9] Anemia, unspecified anemia type [D64.9]     Principal Problem:   Acute diastolic heart failure Active Problems:   Myeloproliferative disease   Morbid obesity due to excess calories   Venous stasis dermatitis of both lower extremities   Chest pain   Thoracic aortic aneurysm   CKD (chronic kidney disease) stage 3, GFR 30-59 ml/min   Acute on chronic combined systolic and diastolic CHF (congestive heart failure)   CKD (chronic kidney disease), stage III      Past Medical History  Diagnosis Date  . CAD (coronary artery disease)     dx elsewheer in past, no documentation. Non-ischemic myovue 2007  . CHF (congestive heart failure)     dx elsewhere, no documentation. Normal EF by previous echo. Trival AS needs f/u ECHO by 2012  . Hypertension   . Sleep apnea, obstructive     at some point used CPAP, was d/c  years ago  . Anemia   . History of thrombocytosis   . Allergic rhinitis   . Edema     R>L leg, u/s 5-12 neg for DVT  . Hemorrhoid   . Type II diabetes mellitus   . Sinus congestion   . Migraine     "once/wk at least" (07/11/2013)  . Shortness of breath   . Myeloproliferative disease   . Ascending aortic aneurysm  03/2014    4.3cm on CT scan    Past Surgical History  Procedure Laterality Date  . Toe surgery Right     "tried to straighten out big toe" (07/11/2013)       HPI  from the history and physical done on the day of admission:    74 y.o. male, with dCHF, OSA, HTN, DM2, and myeloproliferative disorder presents with SOB that has been worsening over the last 3 weeks. Mr. Cothran reports that he lives at home with his son. 3 weeks ago his legs started swelling and he developed a cough with clear sputum. He is unable to lie flat at home and sleeps in his hospital bed with the [facility] elevated. Echocardiogram shows 20% drop in the patient's EF over the past 10 months. Heart failure team was consulted to assist with management.      Hospital Course:     Acute Hypoxic respiratory failure, acute on chronic systolic and diastolic heart failure last EF 30% on echogram this admission.  -Cardiology on board, has diuresed 15 L by 01/27/2015, now switched to  low-dose oral Lasix, now symptom-free and cleared by cardiology for discharge. No ACE/ARB since renal function had declined. Continue fluid and salt restriction. Dry weight appears to be 226 pounds. He will need more diuresis in the outpatient setting, home dose Lasix has been increased, request PCP to monitor weight and BMP in diuretic dose closely. Must follow with cardiology outpatient as well.  Few beats of asymptomatic nonsustained V. tach on 01/25/2015 11 PM.  Electrolytes stable, EF is under 30%, on beta blocker, electrolytes stable. Cardiology on board no further workup per cardiology.  Pulmonary hypertension. Could be due to left heart failure. Pulmonary artery pressure 59. Also could be element of sleep apnea. -Plan as above. Outpatient follow-up with pulmonary post discharge. He does not wish any inpatient workup.  Symptomatic Anemia -Hgb dropped from 9.5 -> 7.2 in 3 months.Guiac negative.This is likely due to progression of the patient's  myelodysplastic syndrome, see 1 unit of packed RBC on 01/21/2015. H&H stable. Outpatient oncology follow-up, request PCP to arrange.  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 Out Patient oncology follow-up.  Bilateral Lower extremity edema -due to CHF, dopplers neg DVT.Continue Lasix.  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  Ascending thoracic Aorta aneurysm  Last checked in 03/2014. 4.3 cm in diameter, needs follow up scan outpatient for monitoring.    Discharge Condition: Fair  Follow UP  Follow-up Information    Follow up with [facility].   Specialty:  [place]   Why:  They will do your home health care at your home   Contact information:   [address] [place] [address] [phone]       Follow up with [facility] On 02/07/2015.   Specialty:  Cardiology   Why:  at 1100 for post hospital follow up. Please bring all of your medications with you to your appointment.  The code for pt parking is 0090.   Contact information:   [address] [id number] [place] [phone]      Follow up with [name], MD. Schedule an appointment as soon as possible for a visit in 3 days.   Specialty:  Internal Medicine   Contact information:   [address] [place] [address] [phone]        Consults obtained - Cards  Diet and Activity recommendation: See Discharge Instructions below  Discharge Instructions       Discharge Instructions    Discharge instructions    Complete  by:  As directed   Follow with Primary MD [name], MD in 3-4 days   Get CBC, CMP, 2 view Chest X ray checked  by Primary MD next visit.    Activity: As tolerated with Full fall precautions use walker/cane & assistance as needed   Disposition Home     Diet: Heart Healthy , Check your Weight same time everyday, if you gain over 2 pounds, or you develop in leg swelling, experience more shortness of breath or chest pain, call your Primary MD immediately. Follow Cardiac Low Salt Diet and 1.5 lit/day fluid restriction.   On your next visit with  your primary care physician please Get Medicines reviewed and adjusted.   Please request your Prim.MD to go over all Hospital Tests and Procedure/Radiological results at the follow up, please get all Hospital records sent to your Prim MD by signing hospital release before you go home.   If you experience worsening of your admission symptoms, develop shortness of breath, life threatening emergency, suicidal or homicidal thoughts you must seek medical attention immediately by calling 911 or calling your MD immediately  if symptoms less severe.  You Must read complete instructions/literature along with all the possible adverse reactions/side effects for all the Medicines you take and that have been prescribed to you. Take any new Medicines after you have completely understood and accpet all the possible adverse reactions/side effects.   Do not drive, operating heavy machinery, perform activities at heights, swimming or participation in water activities or provide baby sitting services if your were admitted for syncope or siezures until you have seen by Primary MD or a Neurologist and advised to do so again.  Do not drive when taking Pain medications.    Do not take more than prescribed Pain, Sleep and Anxiety Medications  Special Instructions: If you have smoked or chewed Tobacco  in the last 2 yrs please stop smoking, stop any regular Alcohol  and or  any Recreational drug use.  Wear Seat belts while driving.   Please note  You were cared for by a hospitalist during your hospital stay. If you have any questions about your discharge medications or the care you received while you were in the hospital after you are discharged, you can call the unit and asked to speak with the hospitalist on call if the hospitalist that took care of you is not available. Once you are discharged, your primary care physician will handle any further medical issues. Please note that NO REFILLS for any discharge medications will be authorized once you are discharged, as it is imperative that you return to your primary care physician (or establish a relationship with a primary care physician if you do not have one) for your aftercare needs so that they can reassess your need for medications and monitor your lab values.     Increase activity slowly    Complete by:  As directed              Discharge Medications       Medication List    STOP taking these medications        metoprolol tartrate 25 MG tablet  Commonly known as:  LOPRESSOR      TAKE these medications        acetaminophen 500 MG tablet  Commonly known as:  TYLENOL  Take 500 mg by mouth every 6 (six) hours as needed for moderate pain or headache.     aspirin 325 MG tablet  Take 1 tablet (325 mg total) by mouth daily.     b complex vitamins capsule  Take 1 capsule by mouth daily.     carvedilol 3.125 MG tablet  Commonly known as:  COREG  Take 1 tablet (3.125 mg total) by mouth 2 (two) times daily with a meal.     feeding supplement (GLUCERNA SHAKE) Liqd  Take 237 mLs by mouth 2 (two) times daily between meals.     fluticasone 50 MCG/ACT nasal spray  Commonly known as:  FLONASE  Place 2 sprays into both nostrils daily.     furosemide 40 MG tablet  Commonly known  as:  LASIX  Take 1 tablet (40 mg total) by mouth 3 (three) times daily.     gabapentin 100 MG capsule  Commonly known  as:  NEURONTIN  Take 1 capsule (100 mg total) by mouth 3 (three) times daily.     hydrocerin Crea  Apply Eucerin cream to BLE Q day after bathing and roughly towel drying to remove loose skin     HYDROcodone-acetaminophen 5-325 MG per tablet  Commonly known as:  NORCO/VICODIN  Take 1 tablet by mouth every 6 (six) hours as needed for moderate pain.     Iron 240 (27 FE) MG Tabs  Take 1 tablet by mouth daily.     isosorbide-hydrALAZINE 20-37.5 MG per tablet  Commonly known as:  BIDIL  Take 1 tablet by mouth 3 (three) times daily.     senna-docusate 8.6-50 MG per tablet  Commonly known as:  Senokot-S  Take 1 tablet by mouth at bedtime.     sertraline 50 MG tablet  Commonly known as:  ZOLOFT  Take 1 tablet (50 mg total) by mouth daily.     spironolactone 25 MG tablet  Commonly known as:  ALDACTONE  Take 1 tablet (25 mg total) by mouth daily.     VITAMIN C PO  Take 1 tablet by mouth daily.        Major procedures and Radiology Reports - PLEASE review detailed and final reports for all details, in brief -       Dg Chest 2 View  01/21/2015   CLINICAL DATA:  Chest pain, shortness of breath and cough for 3 weeks. History of CHF, hypertension and diabetes  EXAM: CHEST  2 VIEW  COMPARISON:  06/16/2014; chest CT- 06/21/2014  FINDINGS: Grossly unchanged enlarged cardiac silhouette and mediastinal contours given persistently reduced volumes. Pulmonary vasculature appears indistinct with cephalization of flow. Grossly unchanged bilateral infrahilar heterogeneous opacities, right greater than left. There is persistent moderate elevation of the right hemidiaphragm. No pleural effusion or pneumothorax. No evidence of edema. No acute osseus abnormalities. Re- demonstrated diffuse increased sclerosis of the osseous structures compatible with provided history of myeloproliferative disease.  IMPRESSION: 1. Suspected mild pulmonary edema on this hypoventilated examination. No definite pleural  effusions. 2. Persistent moderate elevation of the right hemidiaphragm with associated bilateral infrahilar opacities, likely atelectasis. 3. Unchanged diffuse increased sclerosis of the imaged osseous structures compatible with provided history of myeloproliferative disease.   Electronically Signed   By: Sandi Mariscal M.D.   On: 01/21/2015 08:52    Micro Results      No results found for this or any previous visit (from the past 240 hour(s)).     Today   Subjective    [name] today has no headache,no chest abdominal pain,no new weakness tingling or numbness, feels much better wants to go home today.    Objective   Blood pressure 138/71, pulse 74, temperature 98 F (36.7 C), temperature source Oral, resp. rate 18, height '6\' 3"'$  (1.905 m), weight 111.2 kg (245 lb 2.4 oz), SpO2 95 %.   Intake/Output Summary (Last 24 hours) at 01/29/15 0950 Last data filed at 01/29/15 0902  Gross per 24 hour  Intake    840 ml  Output   1450 ml  Net   -610 ml    Exam Awake Alert, Oriented x 3, No new F.N deficits, Normal affect [place].AT,PERRAL Supple Neck,No JVD, No cervical lymphadenopathy appriciated.  Symmetrical Chest wall movement, Good air movement bilaterally, CTAB RRR,No Gallops,Rubs or  new Murmurs, No Parasternal Heave +ve B.Sounds, Abd Soft, Non tender, No organomegaly appriciated, No rebound -guarding or rigidity. No Cyanosis, Clubbing , 1+ Chr edema with chronic Lichinefication, No new Rash or bruise   Data Review   CBC w Diff: Lab Results  Component Value Date   WBC 16.9* 01/22/2015   WBC 21.0* 10/22/2014   WBC 18.4* 11/16/2013   HGB 8.0* 01/22/2015   HGB 9.4* 10/22/2014   HGB 9.8* 11/16/2013   HCT 26.0* 01/22/2015   HCT 29.7* 10/22/2014   HCT 30.7* 11/16/2013   PLT 578* 01/22/2015   PLT 290 10/22/2014   PLT 323 11/16/2013   LYMPHOPCT 24 09/10/2014   BANDSPCT 0 06/22/2014   MONOPCT 9 09/10/2014   EOSPCT 4 09/10/2014   EOSPCT 4 11/16/2013   BASOPCT 12* 09/10/2014     CMP: Lab Results  Component Value Date   NA 139 01/29/2015   NA 138 07/31/2014   NA 143 07/30/2014   NA 138 11/16/2013   K 3.9 01/29/2015   K 5.1* 07/30/2014   K 4.5 11/16/2013   CL 101 01/29/2015   CL 105 07/30/2014   CO2 28 01/29/2015   CO2 28 07/30/2014   CO2 22 11/16/2013   BUN 26* 01/29/2015   BUN 23* 07/31/2014   BUN 21 07/30/2014   BUN 20.2 11/16/2013   CREATININE 1.59* 01/29/2015   CREATININE 1.2 07/31/2014   CREATININE 1.3* 07/30/2014   CREATININE 1.2 11/16/2013   GLU 110 07/31/2014   PROT 6.3* 01/21/2015   PROT 7.0 07/30/2014   PROT 6.7 11/16/2013   ALBUMIN 3.2* 01/21/2015   ALBUMIN 3.4* 11/16/2013   BILITOT 0.5 01/21/2015   BILITOT 0.50 07/30/2014   BILITOT 0.35 11/16/2013   ALKPHOS 99 01/21/2015   ALKPHOS 88* 07/30/2014   ALKPHOS 125 11/16/2013   AST 48* 01/21/2015   AST 30 07/30/2014   AST 25 11/16/2013   ALT 22 01/21/2015   ALT 10 07/30/2014   ALT 11 11/16/2013  .   Total Time in preparing paper work, data evaluation and todays exam - 35 minutes  Thurnell Lose M.D on 01/29/2015 at 9:50 AM  [facility]   Office  [phone]

## 2015-01-29 NOTE — Progress Notes (Signed)
Physical Therapy Treatment Patient Details Name: Juan [name] MRN: [id number] DOB: 1940/07/13 Today's Date: 01/29/2015    History of Present Illness Juan [name] is a 74 y.o. male, with DHF, OSA, HTN, DM2, and myeloproliferative disorder presents with SOB that has been worsening over the last 3 weeks. Juan [name] reports that he lives at home with his son. 3 weeks ago his legs started swelling and he developed a cough with clear sputum. He is unable to lie flat at home and sleeps in his hospital bed with the [facility] elevated. Pt with CHF and pulmonary edema    PT Comments    Pt with continued progression with gait and HEP. Pt able to maintain sats >90% throughout gait on RA with only one brief drop to 89% with immediate rise with cues for breathing. Pt encouraged to increase mobility and continue HEP.   Follow Up Recommendations  Home health PT;Supervision for mobility/OOB     Equipment Recommendations       Recommendations for Other Services       Precautions / Restrictions Precautions Precautions: Fall Restrictions Weight Bearing Restrictions: No    Mobility  Bed Mobility               General bed mobility comments: in chair on arrival  Transfers       Sit to Stand: Supervision         General transfer comment: cues for hand placement  Ambulation/Gait Ambulation/Gait assistance: Supervision Ambulation Distance (Feet): 175 Feet Assistive device: Rolling walker (2 wheeled) Gait Pattern/deviations: Step-through pattern;Trunk flexed;Decreased stride length   Gait velocity interpretation: Below normal speed for age/gender General Gait Details: cues for posture and position in RW pt denied attempting increased distance. Sats 89-94% on RA throughout with cues for breathing maintaining sats >90%   Stairs            Wheelchair Mobility    Modified Rankin (Stroke Patients Only)       Balance     Sitting balance-Leahy Scale: Good       Standing  balance-Leahy Scale: Fair                      Cognition Arousal/Alertness: Awake/alert Behavior During Therapy: WFL for tasks assessed/performed Overall Cognitive Status: Within Functional Limits for tasks assessed                      Exercises General Exercises - Lower Extremity Long Arc Quad: AROM;Seated;Both;20 reps Hip ABduction/ADduction: AROM;Seated;Both;20 reps Hip Flexion/Marching: AROM;Seated;Both;20 reps Toe Raises: AROM;Seated;Both;20 reps Heel Raises: AROM;Seated;Both;20 reps    General Comments        Pertinent Vitals/Pain Pain Assessment: No/denies pain  HR 80-97    Home Living                      Prior Function            PT Goals (current goals can now be found in the care plan section) Progress towards PT goals: Progressing toward goals    Frequency  Min 3X/week    PT Plan Current plan remains appropriate    Co-evaluation             End of Session   Activity Tolerance: Patient tolerated treatment well Patient left: in chair;with call [place]/phone within reach;with chair alarm set     Time: 0737-1062 PT Time Calculation (min) (ACUTE ONLY): 17 min  Charges:  $Gait Training: 8-22 mins  G CodesMelford Aase 02/10/2015, 9:56 AM Elwyn Reach, [place]

## 2015-01-29 NOTE — Discharge Instructions (Signed)
Follow with Primary MD [name], MD in 3-4 days   Get CBC, CMP, 2 view Chest X ray checked  by Primary MD next visit.    Activity: As tolerated with Full fall precautions use walker/cane & assistance as needed   Disposition Home     Diet: Heart Healthy , Check your Weight same time everyday, if you gain over 2 pounds, or you develop in leg swelling, experience more shortness of breath or chest pain, call your Primary MD immediately. Follow Cardiac Low Salt Diet and 1.5 lit/day fluid restriction.   On your next visit with your primary care physician please Get Medicines reviewed and adjusted.   Please request your Prim.MD to go over all Hospital Tests and Procedure/Radiological results at the follow up, please get all Hospital records sent to your Prim MD by signing hospital release before you go home.   If you experience worsening of your admission symptoms, develop shortness of breath, life threatening emergency, suicidal or homicidal thoughts you must seek medical attention immediately by calling 911 or calling your MD immediately  if symptoms less severe.  You Must read complete instructions/literature along with all the possible adverse reactions/side effects for all the Medicines you take and that have been prescribed to you. Take any new Medicines after you have completely understood and accpet all the possible adverse reactions/side effects.   Do not drive, operating heavy machinery, perform activities at heights, swimming or participation in water activities or provide baby sitting services if your were admitted for syncope or siezures until you have seen by Primary MD or a Neurologist and advised to do so again.  Do not drive when taking Pain medications.    Do not take more than prescribed Pain, Sleep and Anxiety Medications  Special Instructions: If you have smoked or chewed Tobacco  in the last 2 yrs please stop smoking, stop any regular Alcohol  and or any Recreational  drug use.  Wear Seat belts while driving.   Please note  You were cared for by a hospitalist during your hospital stay. If you have any questions about your discharge medications or the care you received while you were in the hospital after you are discharged, you can call the unit and asked to speak with the hospitalist on call if the hospitalist that took care of you is not available. Once you are discharged, your primary care physician will handle any further medical issues. Please note that NO REFILLS for any discharge medications will be authorized once you are discharged, as it is imperative that you return to your primary care physician (or establish a relationship with a primary care physician if you do not have one) for your aftercare needs so that they can reassess your need for medications and monitor your lab values.

## 2015-01-30 NOTE — Telephone Encounter (Signed)
noted 

## 2015-01-30 NOTE — Telephone Encounter (Signed)
Unable to reach patient at time of TCM Call.  Unable to leave voicemail due to voicemail box not being set up.

## 2015-01-30 NOTE — Telephone Encounter (Signed)
FYI- Patient cancelled appointment- he states that the hospital scheduled this appointment without his knowledge.  He has other follow-up appointment with cardiology and he states that it is too much for him to come to and he will go to his cardiology appointments and schedule here if he has any more concerns.

## 2015-01-30 NOTE — Telephone Encounter (Signed)
Pt returned your call. Please call back at : [phone].    Thanks.

## 2015-02-01 ENCOUNTER — Ambulatory Visit: Payer: Medicare Other | Admitting: Internal Medicine

## 2015-02-04 DIAGNOSIS — G43909 Migraine, unspecified, not intractable, without status migrainosus: Secondary | ICD-10-CM | POA: Diagnosis not present

## 2015-02-04 DIAGNOSIS — I5042 Chronic combined systolic (congestive) and diastolic (congestive) heart failure: Secondary | ICD-10-CM | POA: Diagnosis not present

## 2015-02-04 DIAGNOSIS — Z7951 Long term (current) use of inhaled steroids: Secondary | ICD-10-CM | POA: Diagnosis not present

## 2015-02-04 DIAGNOSIS — L97911 Non-pressure chronic ulcer of unspecified part of right lower leg limited to breakdown of skin: Secondary | ICD-10-CM | POA: Diagnosis not present

## 2015-02-04 DIAGNOSIS — E1122 Type 2 diabetes mellitus with diabetic chronic kidney disease: Secondary | ICD-10-CM | POA: Diagnosis not present

## 2015-02-04 DIAGNOSIS — I272 Other secondary pulmonary hypertension: Secondary | ICD-10-CM | POA: Diagnosis not present

## 2015-02-04 DIAGNOSIS — Z7982 Long term (current) use of aspirin: Secondary | ICD-10-CM | POA: Diagnosis not present

## 2015-02-04 DIAGNOSIS — D469 Myelodysplastic syndrome, unspecified: Secondary | ICD-10-CM | POA: Diagnosis not present

## 2015-02-04 DIAGNOSIS — G4733 Obstructive sleep apnea (adult) (pediatric): Secondary | ICD-10-CM | POA: Diagnosis not present

## 2015-02-04 DIAGNOSIS — I872 Venous insufficiency (chronic) (peripheral): Secondary | ICD-10-CM | POA: Diagnosis not present

## 2015-02-04 DIAGNOSIS — L97921 Non-pressure chronic ulcer of unspecified part of left lower leg limited to breakdown of skin: Secondary | ICD-10-CM | POA: Diagnosis not present

## 2015-02-04 DIAGNOSIS — Z87891 Personal history of nicotine dependence: Secondary | ICD-10-CM | POA: Diagnosis not present

## 2015-02-04 DIAGNOSIS — N183 Chronic kidney disease, stage 3 (moderate): Secondary | ICD-10-CM | POA: Diagnosis not present

## 2015-02-04 DIAGNOSIS — I2510 Atherosclerotic heart disease of native coronary artery without angina pectoris: Secondary | ICD-10-CM | POA: Diagnosis not present

## 2015-02-04 DIAGNOSIS — M6281 Muscle weakness (generalized): Secondary | ICD-10-CM | POA: Diagnosis not present

## 2015-02-04 DIAGNOSIS — I129 Hypertensive chronic kidney disease with stage 1 through stage 4 chronic kidney disease, or unspecified chronic kidney disease: Secondary | ICD-10-CM | POA: Diagnosis not present

## 2015-02-04 DIAGNOSIS — D649 Anemia, unspecified: Secondary | ICD-10-CM | POA: Diagnosis not present

## 2015-02-04 DIAGNOSIS — D471 Chronic myeloproliferative disease: Secondary | ICD-10-CM | POA: Diagnosis not present

## 2015-02-04 DIAGNOSIS — Z9181 History of falling: Secondary | ICD-10-CM | POA: Diagnosis not present

## 2015-02-05 ENCOUNTER — Telehealth: Payer: Self-pay | Admitting: Internal Medicine

## 2015-02-05 ENCOUNTER — Ambulatory Visit: Payer: Self-pay | Admitting: Internal Medicine

## 2015-02-05 DIAGNOSIS — L97911 Non-pressure chronic ulcer of unspecified part of right lower leg limited to breakdown of skin: Secondary | ICD-10-CM | POA: Diagnosis not present

## 2015-02-05 DIAGNOSIS — I872 Venous insufficiency (chronic) (peripheral): Secondary | ICD-10-CM | POA: Diagnosis not present

## 2015-02-05 DIAGNOSIS — E1122 Type 2 diabetes mellitus with diabetic chronic kidney disease: Secondary | ICD-10-CM | POA: Diagnosis not present

## 2015-02-05 DIAGNOSIS — L97921 Non-pressure chronic ulcer of unspecified part of left lower leg limited to breakdown of skin: Secondary | ICD-10-CM | POA: Diagnosis not present

## 2015-02-05 DIAGNOSIS — I5042 Chronic combined systolic (congestive) and diastolic (congestive) heart failure: Secondary | ICD-10-CM | POA: Diagnosis not present

## 2015-02-05 DIAGNOSIS — M6281 Muscle weakness (generalized): Secondary | ICD-10-CM | POA: Diagnosis not present

## 2015-02-05 NOTE — Telephone Encounter (Signed)
Caller name: Butler Denmark   Relation to pt: OT from [place]  Call back number: [phone]    Reason for call:  Requesting verbal orders for 2x a week for 5 weeks.

## 2015-02-05 NOTE — Telephone Encounter (Signed)
Spoke with Estill Bamberg, verbal orders given.

## 2015-02-06 ENCOUNTER — Telehealth: Payer: Self-pay | Admitting: Cardiovascular Disease

## 2015-02-06 NOTE — Telephone Encounter (Signed)
New Message   Pt home health nurse is calling to confirm  The order frequency  for Lab and office visits

## 2015-02-06 NOTE — Telephone Encounter (Signed)
Called home health nurse, unable to leave message, "mailbox not set up" 02-06-15

## 2015-02-07 ENCOUNTER — Ambulatory Visit (HOSPITAL_COMMUNITY)
Admit: 2015-02-07 | Discharge: 2015-02-07 | Disposition: A | Discharge status: Home / Self Care | Payer: Medicare Other | Source: Ambulatory Visit | Attending: Internal Medicine | Admitting: Internal Medicine

## 2015-02-07 VITALS — BP 128/80 | HR 68 | Wt 247.0 lb

## 2015-02-07 DIAGNOSIS — N183 Chronic kidney disease, stage 3 unspecified: Secondary | ICD-10-CM

## 2015-02-07 DIAGNOSIS — I10 Essential (primary) hypertension: Secondary | ICD-10-CM | POA: Diagnosis not present

## 2015-02-07 DIAGNOSIS — I5021 Acute systolic (congestive) heart failure: Secondary | ICD-10-CM

## 2015-02-07 DIAGNOSIS — I129 Hypertensive chronic kidney disease with stage 1 through stage 4 chronic kidney disease, or unspecified chronic kidney disease: Secondary | ICD-10-CM | POA: Insufficient documentation

## 2015-02-07 DIAGNOSIS — D471 Chronic myeloproliferative disease: Secondary | ICD-10-CM | POA: Diagnosis not present

## 2015-02-07 DIAGNOSIS — I5043 Acute on chronic combined systolic (congestive) and diastolic (congestive) heart failure: Secondary | ICD-10-CM | POA: Diagnosis not present

## 2015-02-07 DIAGNOSIS — Z79899 Other long term (current) drug therapy: Secondary | ICD-10-CM | POA: Insufficient documentation

## 2015-02-07 MED ORDER — METOLAZONE 5 MG PO TABS: 5.0000 mg | ORAL_TABLET | ORAL | Status: DC

## 2015-02-07 MED ORDER — TORSEMIDE 20 MG PO TABS: 80.0000 mg | ORAL_TABLET | Freq: Every day | ORAL | Status: DC

## 2015-02-07 NOTE — Telephone Encounter (Signed)
Routing to Dr. Johnsie Cancel regarding request for verbal order by [facility] home health for med mangement and heart failure - 2-3x times a week to decrease as pt progresses.

## 2015-02-07 NOTE — Progress Notes (Signed)
Patient ID: Juan [name], male   DOB: 1940/08/12, 74 y.o.   MRN: [id number]  PCP: Dr Carles Collet Primary HF Cardiologist: Dr Haroldine Laws  Primary Cardiologist: Dr Johnsie Cancel   HPI: Mr Juan [name] is a 74 y.o. male with history of Diastolic HF, OSA, HTN, DM2, and myeloproliferative disorder followed by Oncology.   He was seen by onc 10/22/14 for his myeloproliferative disorder. 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   Admitted to [facility] with increased dyspnea and volume overload. Diuresed with lasix and transitioned to lasix 40 mg three times a day.   He returns for post hospital follow up. Dyspneic with exertion. + Orthopnea. Increased leg edema. Denies PND. Limited ambulation. Essentially chair bound. Not able to able to pay for medicaitons but does not want for medications. Weight at home trending up. Not weighing at home. Not following a particular diet.    Echo 01/21/15 EF 30%, moderate LVH, mild MR, PA peak pressure 59 mm Hg, Normal RV  Labs 01/29/2015: K 3.9 Creatinine 1.59   ROS: All systems negative except as listed in HPI, PMH and Problem List.  SH:  Social History   Social History  . Marital Status: Widowed    Spouse Name: Juan [name]  . Number of Children: 2  . Years of Education: Juan [name]   Occupational History  . retired    .     Social History Main Topics  . Smoking status: Former Smoker -- 0.25 packs/day for 12 years    Types: Cigarettes    Quit date: 05/18/1966  . Smokeless tobacco: Never Used     Comment: quit smoking 45 years ago  . Alcohol Use: No  . Drug Use: No  . Sexual Activity: Yes   Other Topics Concern  . Not on file   Social History Narrative   Moved from [place] 2006   Widow 2007   Son lives with him     FH:  Family History  Problem Relation Age of Onset  . Colon cancer Neg Hx   . Prostate cancer Neg Hx   . Heart attack Neg Hx   . Diabetes Neg Hx   . Schizophrenia Son      Past Medical History  Diagnosis Date  . CAD (coronary artery disease)     dx elsewheer in past, no documentation. Non-ischemic myovue 2007  . CHF (congestive heart failure)     dx elsewhere, no documentation. Normal EF by previous echo. Trival AS needs f/u ECHO by 2012  . Hypertension   . Sleep apnea, obstructive     at some point used CPAP, was d/c  years ago  . Anemia   . History of thrombocytosis   . Allergic rhinitis   . Edema     R>L leg, u/s 5-12 neg for DVT  . Hemorrhoid   . Type II diabetes mellitus   . Sinus congestion   . Migraine     "once/wk at least" (07/11/2013)  . Shortness of breath   . Myeloproliferative disease   . Ascending aortic aneurysm 03/2014    4.3cm on CT scan    Current Outpatient Prescriptions  Medication Sig Dispense Refill  . acetaminophen (TYLENOL) 500 MG tablet Take 500 mg by mouth every 6 (six) hours as needed for moderate pain or headache.     . Ascorbic Acid (VITAMIN C PO) Take 1 tablet by mouth daily.    Marland Kitchen  aspirin 325 MG tablet Take 1 tablet (325 mg total) by mouth daily.    Marland Kitchen b complex vitamins capsule Take 1 capsule by mouth daily. 30 capsule 3  . carvedilol (COREG) 3.125 MG tablet Take 1 tablet (3.125 mg total) by mouth 2 (two) times daily with a meal. 60 tablet 0  . feeding supplement, GLUCERNA SHAKE, (GLUCERNA SHAKE) LIQD Take 237 mLs by mouth as needed (protein).    . Ferrous Gluconate (IRON) 240 (27 FE) MG TABS Take 1 tablet by mouth daily.    . fluticasone (FLONASE) 50 MCG/ACT nasal spray Place 2 sprays into both nostrils daily.     . furosemide (LASIX) 40 MG tablet Take 120 mg by mouth daily.    Marland Kitchen gabapentin (NEURONTIN) 100 MG capsule Take 100 mg by mouth 3 (three) times daily as needed (pain).    . hydrocerin (EUCERIN) CREA Apply Eucerin cream to BLE Q day after bathing and roughly towel drying to remove loose skin 228 g 0  . HYDROcodone-acetaminophen (NORCO/VICODIN) 5-325 MG per tablet Take 1 tablet by mouth every 6 (six)  hours as needed for moderate pain. 30 tablet 0  . isosorbide-hydrALAZINE (BIDIL) 20-37.5 MG per tablet Take 1 tablet by mouth 3 (three) times daily. 90 tablet 0  . spironolactone (ALDACTONE) 25 MG tablet Take 1 tablet (25 mg total) by mouth daily. 30 tablet 0   No current facility-administered medications for this encounter.    Filed Vitals:   02/07/15 1133  BP: 128/80  Pulse: 68  Weight: 247 lb (112.038 kg)  SpO2: 97%    PHYSICAL EXAM:  General:  Well appearing. No resp difficulty. Arrived in wheel chair. Son present  HEENT: normal Neck: supple. JVP jaw. Carotids 2+ bilaterally; no bruits. No lymphadenopathy or thryomegaly appreciated. Cor: PMI normal. Regular rate & rhythm. No rubs, gallops or murmurs. Lungs: clear Abdomen: soft, nontender, nondistended. No hepatosplenomegaly. No bruits or masses. Good bowel sounds. Extremities: no cyanosis, clubbing, rash, R and LLE 3+ edema Neuro: alert & orientedx3, cranial nerves grossly intact. Moves all 4 extremities w/o difficulty. Affect pleasant   ASSESSMENT & PLAN:  1. Acute/Chronic Systolic CHF, EF 42% echo 01/21/15, Normal myoview 2007.  NYHA III. Marked volume overload. Stop lasix and start torsemide 80 mg daily and he will take 5 mg metolazone for 2 days.  Difficult situation with poor insight related to medications. Offered medication assistance however he refused [employer].  Reinforced daily weights, low salt food choices, and limiting fluid intake < 2 liters per day.  2. CKD stage III - Baseline 1.4 4. HTN 5. 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 Does not want further investigation or treatment.   Follow up in 1 week to reassess volume status. Plan to check BMET that time.    Draysen Weygandt CLEGG, NP-C  Patient seen and examined with Darrick Grinder, NP. We discussed all aspects of the encounter. I agree with the assessment and plan as stated above.   He was just discharged from hospital after marked  diuresis. Echo showed newly decreased EF. He refused cath. Given possible myeloproliferative transformation and lack of ischemic sx, we did not push him hard on this. Today he is again markedly volume overloaded and has limited insight into his HF and meds. Agree with switch to torsemide and adding metolazone for 2 days. Discussed with his son at bedside. Will see back next week to reassess. Extensive education provided.   Total time spent 35 minutes. Over half that time spent discussing  above.   Bensimhon, Daniel,MD 5:31 PM

## 2015-02-07 NOTE — Telephone Encounter (Signed)
[facility] home health calling back to get verbal order for med mangement and heart failure-2-3 times a week to decrease as pt progresses [phone]

## 2015-02-07 NOTE — Telephone Encounter (Signed)
He has normal EF edema LE;s from venous disease Primary should order home health

## 2015-02-07 NOTE — Patient Instructions (Addendum)
Stop Lasix  Start Torsemide '80mg'$  once daily  Metolazone 5 mg one tablet for 2 days only.  See Amy NP in clinic next week.   Will contact [facility] to come out   Start weighing every morning and write down on calendar

## 2015-02-07 NOTE — Progress Notes (Addendum)
Advanced Heart Failure Medication Review by a Pharmacist  Does the patient  feel that his/her medications are working for him/her?  yes  Has the patient been experiencing any side effects to the medications prescribed?  no  Does the patient measure his/her own blood pressure or blood glucose at home?  no   Does the patient have any problems obtaining medications due to transportation or finances?   no  Understanding of regimen: fair Understanding of indications: fair Potential of compliance: fair    Pharmacist comments:  Juan [name] is a 74 yo M presenting without a current medication list but is able to verbalize some dosages of his medications. He states that his anagrelide and potassium supplementation were discontinued recently and he is not sure why. He did cancel his appointment with Dr. Larose Kells a few days ago so I have advised him to reschedule his appointment. He is somewhat confused about some of his medications and whether or not he has been taking them so I will call and verify last fill dates with his pharmacy. He also stated that he has had trouble affording his medications. I offered to initiate a [employer] application for him but he refused.   [name]. Velva Harman, PharmD, BCPS, CPP Clinical Pharmacist Pager: [phone] Phone: [phone] 02/07/2015 11:37 AM

## 2015-02-08 ENCOUNTER — Telehealth (HOSPITAL_COMMUNITY): Payer: Self-pay | Admitting: *Deleted

## 2015-02-08 DIAGNOSIS — L97921 Non-pressure chronic ulcer of unspecified part of left lower leg limited to breakdown of skin: Secondary | ICD-10-CM | POA: Diagnosis not present

## 2015-02-08 DIAGNOSIS — L97911 Non-pressure chronic ulcer of unspecified part of right lower leg limited to breakdown of skin: Secondary | ICD-10-CM | POA: Diagnosis not present

## 2015-02-08 DIAGNOSIS — M6281 Muscle weakness (generalized): Secondary | ICD-10-CM | POA: Diagnosis not present

## 2015-02-08 DIAGNOSIS — E1122 Type 2 diabetes mellitus with diabetic chronic kidney disease: Secondary | ICD-10-CM | POA: Diagnosis not present

## 2015-02-08 DIAGNOSIS — I872 Venous insufficiency (chronic) (peripheral): Secondary | ICD-10-CM | POA: Diagnosis not present

## 2015-02-08 DIAGNOSIS — I5042 Chronic combined systolic (congestive) and diastolic (congestive) heart failure: Secondary | ICD-10-CM | POA: Diagnosis not present

## 2015-02-08 NOTE — Telephone Encounter (Signed)
Juan [name] left message requesting a verbal for skilled nursing.  I left a voice message for Juan [name] to call me back also i informed her we close at 5pm today and are closed on the weekends.

## 2015-02-11 ENCOUNTER — Telehealth: Payer: Self-pay | Admitting: Internal Medicine

## 2015-02-11 NOTE — Telephone Encounter (Signed)
New Message   J[facility] [name] homehealth calling to get verbal orders for home visit for pt. Please call back and discuss,.

## 2015-02-11 NOTE — Telephone Encounter (Signed)
noted 

## 2015-02-11 NOTE — Telephone Encounter (Signed)
Caller name: Opal Sidles  Relation to pt: Clinical Manager from [place]  Call back number: [phone]    Reason for call:  Requesting orders for skilled nursing for twice a week.

## 2015-02-11 NOTE — Telephone Encounter (Signed)
[place]  NOTIFIED .Adonis Housekeeper

## 2015-02-11 NOTE — Telephone Encounter (Signed)
Please advise. Pt has cancelled last 2 OV: 02/01/2015 and 02/05/2015. No future appt scheduled at this time.

## 2015-02-12 DIAGNOSIS — I5021 Acute systolic (congestive) heart failure: Secondary | ICD-10-CM | POA: Insufficient documentation

## 2015-02-12 NOTE — Telephone Encounter (Signed)
Juan [name] with [facility] called for verbal order on skilled nursing. Pt is needing a visit today but they need verbal before they can see the pt. Please call asap at [phone]

## 2015-02-12 NOTE — Telephone Encounter (Signed)
Tried Market researcher at [phone] and Jane at [phone] both numbers have fast busy tone. Will try calling again once return to office in the morning.

## 2015-02-12 NOTE — Telephone Encounter (Signed)
Caller name: Bridgette  Relation to pt: LPN from [facility]  Call back number: [phone]    Reason for call:  Checking on the status of message below. Please follow up directly.

## 2015-02-12 NOTE — Telephone Encounter (Signed)
Notified Romie Minus [facility], that Dr. Johnsie Cancel feels that the PCP md should overlook the home health piece of the patients care. Romie Minus, [facility], agreed that they had reached out to the PCP and was awaiting further orders.

## 2015-02-13 NOTE — Telephone Encounter (Signed)
Tried calling Bridgette again this morning at [phone], number rings fast busy. Michiel Cowboy at [phone], verbals given for home care, informed her that Pt has missed last 2 scheduled appts and needs to be seen within the next month or 2. Opal Sidles verbalized understanding and would have Pt call to schedule appt at his convenience.

## 2015-02-14 ENCOUNTER — Encounter (HOSPITAL_COMMUNITY): Payer: Self-pay

## 2015-02-14 ENCOUNTER — Ambulatory Visit (HOSPITAL_COMMUNITY)
Admission: RE | Admit: 2015-02-14 | Discharge: 2015-02-14 | Disposition: A | Discharge status: Home / Self Care | Payer: Medicare Other | Source: Ambulatory Visit | Attending: Cardiology | Admitting: Cardiology

## 2015-02-14 ENCOUNTER — Encounter: Payer: Self-pay | Admitting: Licensed Clinical Social Worker

## 2015-02-14 VITALS — BP 142/86 | HR 82 | Wt 228.0 lb

## 2015-02-14 DIAGNOSIS — R197 Diarrhea, unspecified: Secondary | ICD-10-CM | POA: Insufficient documentation

## 2015-02-14 DIAGNOSIS — Z7982 Long term (current) use of aspirin: Secondary | ICD-10-CM | POA: Diagnosis not present

## 2015-02-14 DIAGNOSIS — I129 Hypertensive chronic kidney disease with stage 1 through stage 4 chronic kidney disease, or unspecified chronic kidney disease: Secondary | ICD-10-CM | POA: Insufficient documentation

## 2015-02-14 DIAGNOSIS — Z79899 Other long term (current) drug therapy: Secondary | ICD-10-CM | POA: Insufficient documentation

## 2015-02-14 DIAGNOSIS — Z87891 Personal history of nicotine dependence: Secondary | ICD-10-CM | POA: Diagnosis not present

## 2015-02-14 DIAGNOSIS — N183 Chronic kidney disease, stage 3 (moderate): Secondary | ICD-10-CM | POA: Insufficient documentation

## 2015-02-14 DIAGNOSIS — I5032 Chronic diastolic (congestive) heart failure: Secondary | ICD-10-CM | POA: Diagnosis not present

## 2015-02-14 DIAGNOSIS — I5022 Chronic systolic (congestive) heart failure: Secondary | ICD-10-CM | POA: Diagnosis not present

## 2015-02-14 DIAGNOSIS — C946 Myelodysplastic disease, not classified: Secondary | ICD-10-CM | POA: Insufficient documentation

## 2015-02-14 DIAGNOSIS — I5023 Acute on chronic systolic (congestive) heart failure: Secondary | ICD-10-CM | POA: Insufficient documentation

## 2015-02-14 DIAGNOSIS — E1122 Type 2 diabetes mellitus with diabetic chronic kidney disease: Secondary | ICD-10-CM | POA: Insufficient documentation

## 2015-02-14 LAB — BASIC METABOLIC PANEL
ANION GAP: 9 (ref 5–15)
BUN: 42 mg/dL — ABNORMAL HIGH (ref 6–20)
CHLORIDE: 105 mmol/L (ref 101–111)
CO2: 28 mmol/L (ref 22–32)
Calcium: 10.1 mg/dL (ref 8.9–10.3)
Creatinine, Ser: 1.72 mg/dL — ABNORMAL HIGH (ref 0.61–1.24)
GFR, EST AFRICAN AMERICAN: 44 mL/min — AB (ref >60)
GFR, EST NON AFRICAN AMERICAN: 38 mL/min — AB (ref >60)
Glucose, Bld: 102 mg/dL — ABNORMAL HIGH (ref 65–99)
POTASSIUM: 3.9 mmol/L (ref 3.5–5.1)
SODIUM: 142 mmol/L (ref 135–145)

## 2015-02-14 MED ORDER — TORSEMIDE 20 MG PO TABS: 80.0000 mg | ORAL_TABLET | Freq: Every day | ORAL | Status: DC

## 2015-02-14 NOTE — Patient Instructions (Addendum)
FOLLOW UP:4-6 weeks n/p clinic  STOP Metolazone

## 2015-02-14 NOTE — Progress Notes (Signed)
Patient ID: Juan [name], male   DOB: 04-20-1941, 74 y.o.   MRN: [id number]  PCP: Dr Carles Collet Primary HF Cardiologist: Dr Haroldine Laws  Primary Cardiologist: Dr Johnsie Cancel   HPI: Juan [name] is a 74 y.o. male with history of Diastolic HF, OSA, HTN, DM2, and myeloproliferative disorder followed by Oncology.   He was seen by onc 10/22/14 for his myeloproliferative disorder. 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   Admitted to [facility] with increased dyspnea and volume overload. Diuresed with lasix and transitioned to lasix 40 mg three times a day.   He returns for follow up. Last visit he was instructed to take torsemide and metolazone for a few days. Weight at home 242 down to 226 pounds. Denies SOB/PND. Sleeps in [facility]. He is not taking meds as ordered because he says he cant afford. Attempts have been made by our pharmacist to receive medication assistance.  Echo 01/21/15 EF 30%, moderate LVH, mild MR, PA peak pressure 59 mm Hg, Normal RV  Labs 01/29/2015: K 3.9 Creatinine 1.59  Labs 02/14/15: K 3.9 Creatinine 1.72   ROS: All systems negative except as listed in HPI, PMH and Problem List.  SH:  Social History   Social History  . Marital Status: Widowed    Spouse Name: N/A  . Number of Children: 2  . Years of Education: N/A   Occupational History  . retired    .     Social History Main Topics  . Smoking status: Former Smoker -- 0.25 packs/day for 12 years    Types: Cigarettes    Quit date: 05/18/1966  . Smokeless tobacco: Never Used     Comment: quit smoking 45 years ago  . Alcohol Use: No  . Drug Use: No  . Sexual Activity: Yes   Other Topics Concern  . Not on file   Social History Narrative   Moved from [place] 2006   Widow 2007   Son lives with him     FH:  Family History  Problem Relation Age of Onset  . Colon cancer Neg Hx   . Prostate cancer Neg Hx   . Heart attack Neg Hx   .  Diabetes Neg Hx   . Schizophrenia Son     Past Medical History  Diagnosis Date  . CAD (coronary artery disease)     dx elsewheer in past, no documentation. Non-ischemic myovue 2007  . CHF (congestive heart failure)     dx elsewhere, no documentation. Normal EF by previous echo. Trival AS needs f/u ECHO by 2012  . Hypertension   . Sleep apnea, obstructive     at some point used CPAP, was d/c  years ago  . Anemia   . History of thrombocytosis   . Allergic rhinitis   . Edema     R>L leg, u/s 5-12 neg for DVT  . Hemorrhoid   . Type II diabetes mellitus   . Sinus congestion   . Migraine     "once/wk at least" (07/11/2013)  . Shortness of breath   . Myeloproliferative disease   . Ascending aortic aneurysm 03/2014    4.3cm on CT scan    Current Outpatient Prescriptions  Medication Sig Dispense Refill  . acetaminophen (TYLENOL) 500 MG tablet Take 500 mg by mouth every 6 (six) hours as needed for moderate pain or headache.     . Ascorbic Acid (VITAMIN C  PO) Take 1 tablet by mouth daily.    Marland Kitchen aspirin 325 MG tablet Take 1 tablet (325 mg total) by mouth daily.    Marland Kitchen b complex vitamins capsule Take 1 capsule by mouth daily. 30 capsule 3  . carvedilol (COREG) 3.125 MG tablet Take 1 tablet (3.125 mg total) by mouth 2 (two) times daily with a meal. 60 tablet 0  . feeding supplement, GLUCERNA SHAKE, (GLUCERNA SHAKE) LIQD Take 237 mLs by mouth as needed (protein).    . Ferrous Gluconate (IRON) 240 (27 FE) MG TABS Take 1 tablet by mouth daily.    . fluticasone (FLONASE) 50 MCG/ACT nasal spray Place 2 sprays into both nostrils daily.     Marland Kitchen gabapentin (NEURONTIN) 100 MG capsule Take 100 mg by mouth 3 (three) times daily as needed (pain).    . hydrocerin (EUCERIN) CREA Apply Eucerin cream to BLE Q day after bathing and roughly towel drying to remove loose skin 228 g 0  . metolazone (ZAROXOLYN) 5 MG tablet Take 1 tablet (5 mg total) by mouth as directed. Take 1 tablet for only two day starting  02/07/15 15 tablet 0  . HYDROcodone-acetaminophen (NORCO/VICODIN) 5-325 MG per tablet Take 1 tablet by mouth every 6 (six) hours as needed for moderate pain. (Patient not taking: Reported on 02/14/2015) 30 tablet 0  . isosorbide-hydrALAZINE (BIDIL) 20-37.5 MG per tablet Take 1 tablet by mouth 3 (three) times daily. (Patient not taking: Reported on 02/14/2015) 90 tablet 0  . spironolactone (ALDACTONE) 25 MG tablet Take 1 tablet (25 mg total) by mouth daily. (Patient not taking: Reported on 02/14/2015) 30 tablet 0  . torsemide (DEMADEX) 20 MG tablet Take 4 tablets (80 mg total) by mouth daily. (Patient not taking: Reported on 02/14/2015) 120 tablet 3   No current facility-administered medications for this encounter.    Filed Vitals:   02/14/15 1017  BP: 142/86  Pulse: 82  Weight: 228 lb (103.42 kg)  SpO2: 97%    PHYSICAL EXAM:  General:  Well appearing. No resp difficulty. Arrived in wheel chair. HEENT: normal Neck: supple. JVP 8-9. Carotids 2+ bilaterally; no bruits. No lymphadenopathy or thryomegaly appreciated. Cor: PMI normal. Regular rate & rhythm. No rubs, gallops or murmurs. Lungs: clear Abdomen: soft, nontender, nondistended. No hepatosplenomegaly. No bruits or masses. Good bowel sounds. Extremities: no cyanosis, clubbing, rash, R and LLE 2-3+ edema Neuro: alert & orientedx3, cranial nerves grossly intact. Moves all 4 extremities w/o difficulty. Affect pleasant   ASSESSMENT & PLAN: 1. Chronic Systolic CHF, EF 09% echo 01/21/15, Normal myoview 2007.   NYHA III. Volume status improved. BMET today is ok. Continue torsemide 80 mg daily.   He is very difficult to managed given  poor insight related to medications. Offered medication assistance however he refused [employer].  Reinforced daily weights, low salt food choices, and limiting fluid intake < 2 liters per day.  2. CKD stage III - Check BMET now  4. HTN 5. 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 Does not want further investigation or treatment.  6. Diarrhea- I have asked him to follow up with PCP  Follow up in 4 weeks.   AMY CLEGG, NP-C  Patient seen and examined with Darrick Grinder, NP. We discussed all aspects of the encounter. I agree with the assessment and plan as stated above.   Volume status much improved with torsemide but unfortunately it is difficult for him to afford. I had long talk with him and our pharmacists about obtaining  the medicine for him through the PAN foundation but he refuses as he does not want to provide the last 4 digits of his SSN or how the amount of his SS monthly benefits. Thus we cannot apply for a PAN grant for him. He said he will try to get the medicine on his own. Will need to follow closely. Will repeat echo in next few months to reassess EF. He has refused cath to further evaluate etiology of his recent onset systolic dysfunction.   Total time spent 40 minutes. Over half that time spent discussing above.    Bensimhon, Daniel,MD 7:48 PM

## 2015-02-14 NOTE — Progress Notes (Signed)
CSW referred to assist patient with medication assistance. Patient reports he does not have Medicare D and does not want to apply "because I don't want to give any more of my information". Patient reluctant and mistrusting of the system to follow through with any financial/prescription assistance programs. CSW explained Medicare D and the option of assistance through [facility] program. Patient agreed to meet with CSW on next clinic appointment 10/27 and would call SHIP with CSW present. CSW will follow up with patient again on 03/14/15. Raquel Sarna, [place]

## 2015-02-14 NOTE — Progress Notes (Addendum)
Advanced Heart Failure Medication Review by a Pharmacist  Does the patient  feel that his/her medications are working for him/her?  yes  Has the patient been experiencing any side effects to the medications prescribed?  yes  Does the patient measure his/her own blood pressure or blood glucose at home?  no   Does the patient have any problems obtaining medications due to transportation or finances?   yes  Understanding of regimen: poor Understanding of indications: poor Potential of compliance: poor    Pharmacist comments:  Mr. Drako is a 74 yo M presenting without his medication list. He states that he took torsemide for the last week but that they are expensive and he doesn't know if he can afford them anymore. I have offered to initiate a PAN foundation application for him but he refuses to give me any of his SS information so I am not able to do this for him. He says he may be able to afford them on Saturday. I called [facility] and verified that the cost of a 30 day supply of torsemide will be $58 and this is with a [employer] discount card. Mr. Gnau states that he has Medicare A/B but does not have knowledge of a prescription part D card which would likely help to cover some of the cost of the medication. I have asked Kennyth Lose to come and see him to help with applying for Part D coverage. I have also called our outpatient pharmacy who states that a 30 day supply of the torsemide will be $36.65 which I will pick up for him using our petty cash for this one month.   [name]. Velva Harman, PharmD, BCPS, CPP Clinical Pharmacist Pager: [phone] Phone: [phone] 02/14/2015 11:46 AM

## 2015-02-18 DIAGNOSIS — I5042 Chronic combined systolic (congestive) and diastolic (congestive) heart failure: Secondary | ICD-10-CM | POA: Diagnosis not present

## 2015-02-18 DIAGNOSIS — M6281 Muscle weakness (generalized): Secondary | ICD-10-CM | POA: Diagnosis not present

## 2015-02-18 DIAGNOSIS — L97921 Non-pressure chronic ulcer of unspecified part of left lower leg limited to breakdown of skin: Secondary | ICD-10-CM | POA: Diagnosis not present

## 2015-02-18 DIAGNOSIS — E1122 Type 2 diabetes mellitus with diabetic chronic kidney disease: Secondary | ICD-10-CM | POA: Diagnosis not present

## 2015-02-18 DIAGNOSIS — L97911 Non-pressure chronic ulcer of unspecified part of right lower leg limited to breakdown of skin: Secondary | ICD-10-CM | POA: Diagnosis not present

## 2015-02-18 DIAGNOSIS — I872 Venous insufficiency (chronic) (peripheral): Secondary | ICD-10-CM | POA: Diagnosis not present

## 2015-02-20 DIAGNOSIS — L97911 Non-pressure chronic ulcer of unspecified part of right lower leg limited to breakdown of skin: Secondary | ICD-10-CM | POA: Diagnosis not present

## 2015-02-20 DIAGNOSIS — E1122 Type 2 diabetes mellitus with diabetic chronic kidney disease: Secondary | ICD-10-CM | POA: Diagnosis not present

## 2015-02-20 DIAGNOSIS — I5042 Chronic combined systolic (congestive) and diastolic (congestive) heart failure: Secondary | ICD-10-CM | POA: Diagnosis not present

## 2015-02-20 DIAGNOSIS — L97921 Non-pressure chronic ulcer of unspecified part of left lower leg limited to breakdown of skin: Secondary | ICD-10-CM | POA: Diagnosis not present

## 2015-02-20 DIAGNOSIS — I872 Venous insufficiency (chronic) (peripheral): Secondary | ICD-10-CM | POA: Diagnosis not present

## 2015-02-20 DIAGNOSIS — M6281 Muscle weakness (generalized): Secondary | ICD-10-CM | POA: Diagnosis not present

## 2015-02-21 ENCOUNTER — Telehealth (HOSPITAL_COMMUNITY): Payer: Self-pay | Admitting: *Deleted

## 2015-02-21 DIAGNOSIS — E1122 Type 2 diabetes mellitus with diabetic chronic kidney disease: Secondary | ICD-10-CM | POA: Diagnosis not present

## 2015-02-21 DIAGNOSIS — M6281 Muscle weakness (generalized): Secondary | ICD-10-CM | POA: Diagnosis not present

## 2015-02-21 DIAGNOSIS — L97911 Non-pressure chronic ulcer of unspecified part of right lower leg limited to breakdown of skin: Secondary | ICD-10-CM | POA: Diagnosis not present

## 2015-02-21 DIAGNOSIS — I5042 Chronic combined systolic (congestive) and diastolic (congestive) heart failure: Secondary | ICD-10-CM | POA: Diagnosis not present

## 2015-02-21 DIAGNOSIS — I872 Venous insufficiency (chronic) (peripheral): Secondary | ICD-10-CM | POA: Diagnosis not present

## 2015-02-21 DIAGNOSIS — L97921 Non-pressure chronic ulcer of unspecified part of left lower leg limited to breakdown of skin: Secondary | ICD-10-CM | POA: Diagnosis not present

## 2015-02-21 NOTE — Telephone Encounter (Signed)
Refusing tele monitoring.  RN said that his weight was 226.6 lbs and that he is just laying in bed. Spitting up some clear white stuff Lungs sound clear. Wont get up, just lying in bed.  Pt said that MD said he only has to weigh himself once in a while.  HH RN is going back out tomorrow to see him.

## 2015-02-21 NOTE — Telephone Encounter (Signed)
Amy NP is aware of message.

## 2015-02-22 ENCOUNTER — Encounter (HOSPITAL_COMMUNITY): Payer: Self-pay | Admitting: Emergency Medicine

## 2015-02-22 ENCOUNTER — Emergency Department (HOSPITAL_COMMUNITY)
Admission: EM | Admit: 2015-02-22 | Discharge: 2015-02-22 | Disposition: A | Discharge status: Home / Self Care | Payer: Medicare Other | Attending: Emergency Medicine | Admitting: Emergency Medicine

## 2015-02-22 DIAGNOSIS — Z8719 Personal history of other diseases of the digestive system: Secondary | ICD-10-CM | POA: Insufficient documentation

## 2015-02-22 DIAGNOSIS — I872 Venous insufficiency (chronic) (peripheral): Secondary | ICD-10-CM | POA: Diagnosis not present

## 2015-02-22 DIAGNOSIS — G4733 Obstructive sleep apnea (adult) (pediatric): Secondary | ICD-10-CM | POA: Insufficient documentation

## 2015-02-22 DIAGNOSIS — I5042 Chronic combined systolic (congestive) and diastolic (congestive) heart failure: Secondary | ICD-10-CM | POA: Diagnosis not present

## 2015-02-22 DIAGNOSIS — I509 Heart failure, unspecified: Secondary | ICD-10-CM | POA: Insufficient documentation

## 2015-02-22 DIAGNOSIS — Z9981 Dependence on supplemental oxygen: Secondary | ICD-10-CM | POA: Diagnosis not present

## 2015-02-22 DIAGNOSIS — Z87891 Personal history of nicotine dependence: Secondary | ICD-10-CM | POA: Insufficient documentation

## 2015-02-22 DIAGNOSIS — D649 Anemia, unspecified: Secondary | ICD-10-CM | POA: Insufficient documentation

## 2015-02-22 DIAGNOSIS — Z79899 Other long term (current) drug therapy: Secondary | ICD-10-CM | POA: Diagnosis not present

## 2015-02-22 DIAGNOSIS — Z8709 Personal history of other diseases of the respiratory system: Secondary | ICD-10-CM | POA: Insufficient documentation

## 2015-02-22 DIAGNOSIS — M6281 Muscle weakness (generalized): Secondary | ICD-10-CM | POA: Diagnosis not present

## 2015-02-22 DIAGNOSIS — R319 Hematuria, unspecified: Secondary | ICD-10-CM | POA: Diagnosis not present

## 2015-02-22 DIAGNOSIS — L97911 Non-pressure chronic ulcer of unspecified part of right lower leg limited to breakdown of skin: Secondary | ICD-10-CM | POA: Diagnosis not present

## 2015-02-22 DIAGNOSIS — E119 Type 2 diabetes mellitus without complications: Secondary | ICD-10-CM | POA: Insufficient documentation

## 2015-02-22 DIAGNOSIS — I10 Essential (primary) hypertension: Secondary | ICD-10-CM | POA: Diagnosis not present

## 2015-02-22 DIAGNOSIS — G43909 Migraine, unspecified, not intractable, without status migrainosus: Secondary | ICD-10-CM | POA: Insufficient documentation

## 2015-02-22 DIAGNOSIS — R404 Transient alteration of awareness: Secondary | ICD-10-CM | POA: Diagnosis not present

## 2015-02-22 DIAGNOSIS — M549 Dorsalgia, unspecified: Secondary | ICD-10-CM | POA: Diagnosis not present

## 2015-02-22 DIAGNOSIS — I2510 Atherosclerotic heart disease of native coronary artery without angina pectoris: Secondary | ICD-10-CM | POA: Insufficient documentation

## 2015-02-22 DIAGNOSIS — L97921 Non-pressure chronic ulcer of unspecified part of left lower leg limited to breakdown of skin: Secondary | ICD-10-CM | POA: Diagnosis not present

## 2015-02-22 DIAGNOSIS — Z7982 Long term (current) use of aspirin: Secondary | ICD-10-CM | POA: Diagnosis not present

## 2015-02-22 DIAGNOSIS — N390 Urinary tract infection, site not specified: Secondary | ICD-10-CM | POA: Insufficient documentation

## 2015-02-22 DIAGNOSIS — R300 Dysuria: Secondary | ICD-10-CM | POA: Diagnosis present

## 2015-02-22 DIAGNOSIS — R310 Gross hematuria: Secondary | ICD-10-CM

## 2015-02-22 DIAGNOSIS — E1122 Type 2 diabetes mellitus with diabetic chronic kidney disease: Secondary | ICD-10-CM | POA: Diagnosis not present

## 2015-02-22 DIAGNOSIS — R531 Weakness: Secondary | ICD-10-CM | POA: Diagnosis not present

## 2015-02-22 LAB — CBC
HCT: 24.4 % — ABNORMAL LOW (ref 39.0–52.0)
Hemoglobin: 7.7 g/dL — ABNORMAL LOW (ref 13.0–17.0)
MCH: 24.5 pg — AB (ref 26.0–34.0)
MCHC: 31.6 g/dL (ref 30.0–36.0)
MCV: 77.7 fL — AB (ref 78.0–100.0)
PLATELETS: 360 10*3/uL (ref 150–400)
RBC: 3.14 MIL/uL — ABNORMAL LOW (ref 4.22–5.81)
RDW: 20.4 % — AB (ref 11.5–15.5)
WBC: 19.4 10*3/uL — ABNORMAL HIGH (ref 4.0–10.5)

## 2015-02-22 LAB — URINALYSIS, ROUTINE W REFLEX MICROSCOPIC
Glucose, UA: NEGATIVE mg/dL
Ketones, ur: 15 mg/dL — AB
Nitrite: POSITIVE — AB
PH: 5.5 (ref 5.0–8.0)
Protein, ur: 100 mg/dL — AB
SPECIFIC GRAVITY, URINE: 1.011 (ref 1.005–1.030)
UROBILINOGEN UA: 1 mg/dL (ref 0.0–1.0)

## 2015-02-22 LAB — APTT: aPTT: 41 seconds — ABNORMAL HIGH (ref 24–37)

## 2015-02-22 LAB — BASIC METABOLIC PANEL
Anion gap: 9 (ref 5–15)
BUN: 65 mg/dL — AB (ref 6–20)
CHLORIDE: 102 mmol/L (ref 101–111)
CO2: 26 mmol/L (ref 22–32)
CREATININE: 1.82 mg/dL — AB (ref 0.61–1.24)
Calcium: 9.5 mg/dL (ref 8.9–10.3)
GFR calc Af Amer: 41 mL/min — ABNORMAL LOW (ref >60)
GFR calc non Af Amer: 35 mL/min — ABNORMAL LOW (ref >60)
GLUCOSE: 105 mg/dL — AB (ref 65–99)
Potassium: 4.1 mmol/L (ref 3.5–5.1)
SODIUM: 137 mmol/L (ref 135–145)

## 2015-02-22 LAB — PROTIME-INR
INR: 1.45 (ref 0.00–1.49)
Prothrombin Time: 17.8 seconds — ABNORMAL HIGH (ref 11.6–15.2)

## 2015-02-22 LAB — URINE MICROSCOPIC-ADD ON

## 2015-02-22 MED ORDER — CEPHALEXIN 500 MG PO CAPS: 500.0000 mg | ORAL_CAPSULE | Freq: Four times a day (QID) | ORAL | Status: DC

## 2015-02-22 MED ORDER — DEXTROSE 5 % IV SOLN
1.0000 g | Freq: Once | INTRAVENOUS | Status: AC
  Administered 2015-02-22: 1 g via INTRAVENOUS
  Filled 2015-02-22: qty 10

## 2015-02-22 NOTE — ED Notes (Signed)
Attempting to get patient out.  Patient requires cab voucher and help with medications.  SW and CM notified.  Patient requesting food.  Food provided.

## 2015-02-22 NOTE — Care Management (Signed)
CM spoke to [employer] concerning medication assistance. Patient has insurance, therefore he is ineligible for medication assistance. CM reviewed patient's record and noted several CSW encounter encouraging patient to apply for Medicare part D SHIP plan which would cover his prescription and patient was reluctant to apply , stated he did not want to give any more of his information. CM will contact the [facility] [place] CSW Raquel Sarna to follow up with patient.  No further ED CM needs identified.

## 2015-02-22 NOTE — Discharge Instructions (Signed)
Return to the ED with any concerns including vomiting and not able to keep down liquids or medications, difficulty urinating, fainting, difficulty breathing, decreased level of alertness/lethargy, or any other alarming symptoms

## 2015-02-22 NOTE — ED Notes (Signed)
CM informed this nurse that patient has already sought assistance with medications and has been provided with a resource in a prior visit and he needs to follow up with that person.

## 2015-02-22 NOTE — ED Notes (Signed)
Per EMS Pt began to have cold chills last night. He also had blood in his urine, painful to urinate and bilateral leg and back pain.  6-10 back pain.  Per EMS: BP: 140 palpated HR: 100 Resp: 18

## 2015-02-22 NOTE — ED Provider Notes (Signed)
CSN: [id number]     Arrival date & time 02/22/15  1105 History   First MD Initiated Contact with Patient 02/22/15 1111     Chief Complaint  Patient presents with  . Dysuria  . Chills     (Consider location/radiation/quality/duration/timing/severity/associated sxs/prior Treatment) HPI  Pt presenting with c/o feeling cold chills last night, also has seen blood in urine.  He states he began to have symptoms approx 4pm yesterday.  Has not had difficulty urinating, has seen gross blood in urine.  Has not had any easy bruising or bleeding.  No vomiting or change in stools.  Did not measure for fever.  He denies having shortness of breath or cough.  He does not take any blood thinning medications. There are no other associated systemic symptoms, there are no other alleviating or modifying factors.   Past Medical History  Diagnosis Date  . CAD (coronary artery disease)     dx elsewheer in past, no documentation. Non-ischemic myovue 2007  . CHF (congestive heart failure) (HCC)     dx elsewhere, no documentation. Normal EF by previous echo. Trival AS needs f/u ECHO by 2012  . Hypertension   . Sleep apnea, obstructive     at some point used CPAP, was d/c  years ago  . Anemia   . History of thrombocytosis   . Allergic rhinitis   . Edema     R>L leg, u/s 5-12 neg for DVT  . Hemorrhoid   . Type II diabetes mellitus ([place])   . Sinus congestion   . Migraine     "once/wk at least" (07/11/2013)  . Shortness of breath   . Myeloproliferative disease ([place])   . Ascending aortic aneurysm ([place]) 03/2014    4.3cm on CT scan   Past Surgical History  Procedure Laterality Date  . Toe surgery Right     "tried to straighten out big toe" (07/11/2013)   Family History  Problem Relation Age of Onset  . Colon cancer Neg Hx   . Prostate cancer Neg Hx   . Heart attack Neg Hx   . Diabetes Neg Hx   . Schizophrenia Son    Social History  Substance Use Topics  . Smoking status: Former Smoker -- 0.25  packs/day for 12 years    Types: Cigarettes    Quit date: 05/18/1966  . Smokeless tobacco: Never Used     Comment: quit smoking 45 years ago  . Alcohol Use: No    Review of Systems  ROS reviewed and all otherwise negative except for mentioned in HPI    Allergies  Review of patient's allergies indicates no known allergies.  Home Medications   Prior to Admission medications   Medication Sig Start Date End Date Taking? Authorizing Provider  acetaminophen (TYLENOL) 500 MG tablet Take 500 mg by mouth every 6 (six) hours as needed for moderate pain or headache.    Yes Historical Provider, MD  Ascorbic Acid (VITAMIN C PO) Take 1 tablet by mouth daily.   Yes Historical Provider, MD  aspirin 325 MG tablet Take 1 tablet (325 mg total) by mouth daily. 04/16/14  Yes Ripudeep K Rai, MD  b complex vitamins capsule Take 1 capsule by mouth daily. 01/12/13  Yes Irene Pap, NP  carvedilol (COREG) 3.125 MG tablet Take 1 tablet (3.125 mg total) by mouth 2 (two) times daily with a meal. 01/29/15  Yes Thurnell Lose, MD  feeding supplement, GLUCERNA SHAKE, (GLUCERNA SHAKE) LIQD Take 237 mLs by  mouth as needed (protein).   Yes Historical Provider, MD  Ferrous Gluconate (IRON) 240 (27 FE) MG TABS Take 1 tablet by mouth daily.   Yes Historical Provider, MD  fluticasone (FLONASE) 50 MCG/ACT nasal spray Place 2 sprays into both nostrils daily.    Yes Historical Provider, MD  gabapentin (NEURONTIN) 100 MG capsule Take 100 mg by mouth 3 (three) times daily as needed (pain).   Yes Historical Provider, MD  hydrocerin (EUCERIN) CREA Apply Eucerin cream to BLE Q day after bathing and roughly towel drying to remove loose skin 01/27/14  Yes Shanker Kristeen Mans, MD  HYDROcodone-acetaminophen (NORCO/VICODIN) 5-325 MG per tablet Take 1 tablet by mouth every 6 (six) hours as needed for moderate pain. 10/18/14  Yes Colon Branch, MD  isosorbide-hydrALAZINE (BIDIL) 20-37.5 MG per tablet Take 1 tablet by mouth 3 (three) times  daily. 01/29/15  Yes Thurnell Lose, MD  spironolactone (ALDACTONE) 25 MG tablet Take 1 tablet (25 mg total) by mouth daily. 01/29/15  Yes Thurnell Lose, MD  torsemide (DEMADEX) 20 MG tablet Take 4 tablets (80 mg total) by mouth daily. 02/14/15  Yes Larey Dresser, MD  vitamin B-12 (CYANOCOBALAMIN) 100 MCG tablet Take 100 mcg by mouth daily.   Yes Historical Provider, MD  cephALEXin (KEFLEX) 500 MG capsule Take 1 capsule (500 mg total) by mouth 4 (four) times daily. 02/22/15   Alfonzo Beers, MD   BP 167/82 mmHg  Pulse 95  Temp(Src) 98.4 F (36.9 C) (Oral)  Resp 14  Ht '6\' 3"'$  (1.905 m)  Wt 226 lb (102.513 kg)  BMI 28.25 kg/m2  SpO2 97%  Vitals reviewed Physical Exam  Physical Examination: General appearance - alert, well appearing, and in no distress Mental status - alert, oriented to person, place, and time Eyes - no conjunctival injection, no scleral icterus Mouth - mucous membranes moist, pharynx normal without lesions Neck - supple, no significant adenopathy Chest - clear to auscultation, no wheezes, rales or rhonchi, symmetric air entry Heart - normal rate, regular rhythm, normal S1, S2, no murmurs, rubs, clicks or gallops Abdomen - soft, nontender, nondistended, no masses or organomegaly Back exam - no midline tenderness to palpation, no CVA tenderness Neurological - alert, oriented, normal speech Extremities - peripheral pulses normal, bilateral chronic venous stasis changes Skin - normal coloration and turgor  ED Course  Procedures (including critical care time) Labs Review Labs Reviewed  URINALYSIS, ROUTINE W REFLEX MICROSCOPIC (NOT AT [facility]) - Abnormal; Notable for the following:    Color, Urine BROWN (*)    APPearance CLOUDY (*)    Hgb urine dipstick LARGE (*)    Bilirubin Urine MODERATE (*)    Ketones, ur 15 (*)    Protein, ur 100 (*)    Nitrite POSITIVE (*)    Leukocytes, UA SMALL (*)    All other components within normal limits  BASIC METABOLIC PANEL -  Abnormal; Notable for the following:    Glucose, Bld 105 (*)    BUN 65 (*)    Creatinine, Ser 1.82 (*)    GFR calc non Af Amer 35 (*)    GFR calc Af Amer 41 (*)    All other components within normal limits  CBC - Abnormal; Notable for the following:    WBC 19.4 (*)    RBC 3.14 (*)    Hemoglobin 7.7 (*)    HCT 24.4 (*)    MCV 77.7 (*)    MCH 24.5 (*)    RDW  20.4 (*)    All other components within normal limits  PROTIME-INR - Abnormal; Notable for the following:    Prothrombin Time 17.8 (*)    All other components within normal limits  APTT - Abnormal; Notable for the following:    aPTT 41 (*)    All other components within normal limits  URINE MICROSCOPIC-ADD ON - Abnormal; Notable for the following:    Squamous Epithelial / LPF FEW (*)    Bacteria, UA FEW (*)    All other components within normal limits    Imaging Review No results found. I have personally reviewed and evaluated these images and lab results as part of my medical decision-making.   EKG Interpretation None      MDM   Final diagnoses:  UTI (lower urinary tract infection)  Gross hematuria    Pt presenting with gross hematuria.  He describes having subjective fever last night with chills.  No fever, no SIRS criteria.  He does have gross hematuria- nitrite positive.  He has chronic myeloproliferative disorder with chronic leukocytosis that is not significantly changed.  Pt given IV rocephin for UTI- sent urine culture.  Pt advsed that he will need urology followup.  Given strict return precautions.  He is not having any difficulty passing urine at this time.  His chronic renal insufficiency is at baseline.  Discharged with strict return precautions.  Pt agreeable with plan.  Prior records reviewed and considered during this visit   Alfonzo Beers, MD 02/22/15 1558

## 2015-02-25 ENCOUNTER — Telehealth: Payer: Self-pay | Admitting: Licensed Clinical Social Worker

## 2015-02-25 NOTE — Telephone Encounter (Signed)
Patient was seen in the ED over the weekend. CSW received referral to follow up with patient regarding medication assistance. CSW attempted to contact patient with no answer. Patient scheduled for follow up clinic appointment on 10/27 and CSW will follow up then if unable to reach prior to appointment. Raquel Sarna, [place]

## 2015-02-27 ENCOUNTER — Telehealth: Payer: Self-pay | Admitting: Internal Medicine

## 2015-02-27 DIAGNOSIS — L97911 Non-pressure chronic ulcer of unspecified part of right lower leg limited to breakdown of skin: Secondary | ICD-10-CM | POA: Diagnosis not present

## 2015-02-27 DIAGNOSIS — I5042 Chronic combined systolic (congestive) and diastolic (congestive) heart failure: Secondary | ICD-10-CM | POA: Diagnosis not present

## 2015-02-27 DIAGNOSIS — M6281 Muscle weakness (generalized): Secondary | ICD-10-CM | POA: Diagnosis not present

## 2015-02-27 DIAGNOSIS — I872 Venous insufficiency (chronic) (peripheral): Secondary | ICD-10-CM | POA: Diagnosis not present

## 2015-02-27 DIAGNOSIS — L97921 Non-pressure chronic ulcer of unspecified part of left lower leg limited to breakdown of skin: Secondary | ICD-10-CM | POA: Diagnosis not present

## 2015-02-27 DIAGNOSIS — E1122 Type 2 diabetes mellitus with diabetic chronic kidney disease: Secondary | ICD-10-CM | POA: Diagnosis not present

## 2015-02-27 MED ORDER — CIPROFLOXACIN HCL 500 MG PO TABS: 500.0000 mg | ORAL_TABLET | Freq: Two times a day (BID) | ORAL | Status: DC

## 2015-02-27 NOTE — Telephone Encounter (Signed)
Went to the ER 02/22/2015 with gross hematuria, UA support. UTI, urine culture not sent. He is on Keflex, now with fever and unable to come into the office. Plan: Discontinue Keflex Start Cipro 500 mg twice a day #14. ER if unable to control fever, severe chills or not improving in the next 24 hours

## 2015-02-27 NOTE — Telephone Encounter (Signed)
Caller name: [name] with [facility] Relationship to patient: Can be reached: [phone] Pharmacy:  Reason for call: Pt has temp of 103 this morning. Down to 71 now. Pt is still not feeling well after recent ER visit. Pt states that he cannot come to doctor. She is requesting call for advice/next steps.

## 2015-02-27 NOTE — Telephone Encounter (Signed)
Pt seen at ED on 02/22/2015 with UTI. Please advise.

## 2015-02-27 NOTE — Telephone Encounter (Signed)
Spoke with [name], informed her to have Pt d/c Keflex and start Cipro 500 mg 1 tablet twice daily for 7 days, #14 and 0 refills sent to [facility] on [address]. Informed to treat fever with Tylenol and ED if Pt has uncontrollable fever, chills, or not improving in next 24 hours. [name] verbalized understanding and informed me that she "doesn't know what to do with Pt." She informed me that Pt refuses her visits most days and states he is not going to doctors appointments because he does not have the money to pay for a ride. Pt's daughter, whom is his emergency contact, has told [name] and [place] to stop calling because she doesn't want to be bothered. [name] states Pt just seems miserable.

## 2015-03-01 ENCOUNTER — Telehealth: Payer: Self-pay | Admitting: Internal Medicine

## 2015-03-01 DIAGNOSIS — L97921 Non-pressure chronic ulcer of unspecified part of left lower leg limited to breakdown of skin: Secondary | ICD-10-CM | POA: Diagnosis not present

## 2015-03-01 DIAGNOSIS — E1122 Type 2 diabetes mellitus with diabetic chronic kidney disease: Secondary | ICD-10-CM | POA: Diagnosis not present

## 2015-03-01 DIAGNOSIS — M6281 Muscle weakness (generalized): Secondary | ICD-10-CM | POA: Diagnosis not present

## 2015-03-01 DIAGNOSIS — I872 Venous insufficiency (chronic) (peripheral): Secondary | ICD-10-CM | POA: Diagnosis not present

## 2015-03-01 DIAGNOSIS — L97911 Non-pressure chronic ulcer of unspecified part of right lower leg limited to breakdown of skin: Secondary | ICD-10-CM | POA: Diagnosis not present

## 2015-03-01 DIAGNOSIS — I5042 Chronic combined systolic (congestive) and diastolic (congestive) heart failure: Secondary | ICD-10-CM | POA: Diagnosis not present

## 2015-03-01 NOTE — Telephone Encounter (Signed)
FYI. Please advise.

## 2015-03-01 NOTE — Telephone Encounter (Signed)
Caller name: [name]  Relation to pt: LPN [facility]  Call back number: [phone]    Reason for call:  Requesting verbal orders for social worker and wanted to inform PCP patient  feet are swollen and refuses to go to hospital.

## 2015-03-01 NOTE — Telephone Encounter (Signed)
Okay to get a Education officer, museum.

## 2015-03-01 NOTE — Telephone Encounter (Signed)
Spoke with [name], verbal orders given for Education officer, museum. Informed to continue monitoring feet, if worse go to ED. [name] verbalized understanding.

## 2015-03-04 DIAGNOSIS — L97921 Non-pressure chronic ulcer of unspecified part of left lower leg limited to breakdown of skin: Secondary | ICD-10-CM | POA: Diagnosis not present

## 2015-03-04 DIAGNOSIS — I872 Venous insufficiency (chronic) (peripheral): Secondary | ICD-10-CM | POA: Diagnosis not present

## 2015-03-04 DIAGNOSIS — E1122 Type 2 diabetes mellitus with diabetic chronic kidney disease: Secondary | ICD-10-CM | POA: Diagnosis not present

## 2015-03-04 DIAGNOSIS — L97911 Non-pressure chronic ulcer of unspecified part of right lower leg limited to breakdown of skin: Secondary | ICD-10-CM | POA: Diagnosis not present

## 2015-03-04 DIAGNOSIS — I5042 Chronic combined systolic (congestive) and diastolic (congestive) heart failure: Secondary | ICD-10-CM | POA: Diagnosis not present

## 2015-03-04 DIAGNOSIS — M6281 Muscle weakness (generalized): Secondary | ICD-10-CM | POA: Diagnosis not present

## 2015-03-05 ENCOUNTER — Telehealth (HOSPITAL_COMMUNITY): Payer: Self-pay

## 2015-03-05 DIAGNOSIS — L97921 Non-pressure chronic ulcer of unspecified part of left lower leg limited to breakdown of skin: Secondary | ICD-10-CM | POA: Diagnosis not present

## 2015-03-05 DIAGNOSIS — M6281 Muscle weakness (generalized): Secondary | ICD-10-CM | POA: Diagnosis not present

## 2015-03-05 DIAGNOSIS — I872 Venous insufficiency (chronic) (peripheral): Secondary | ICD-10-CM | POA: Diagnosis not present

## 2015-03-05 DIAGNOSIS — L97911 Non-pressure chronic ulcer of unspecified part of right lower leg limited to breakdown of skin: Secondary | ICD-10-CM | POA: Diagnosis not present

## 2015-03-05 DIAGNOSIS — I5042 Chronic combined systolic (congestive) and diastolic (congestive) heart failure: Secondary | ICD-10-CM | POA: Diagnosis not present

## 2015-03-05 DIAGNOSIS — E1122 Type 2 diabetes mellitus with diabetic chronic kidney disease: Secondary | ICD-10-CM | POA: Diagnosis not present

## 2015-03-05 NOTE — Telephone Encounter (Signed)
Juan [name] PT assistant from [employer] [facility] called to let us know that the patient has been refusing therapy for several weeks and when he does participate he c/o feeling too tired.  Juan [name] is looking for advisement on what can be done to encourage his participation

## 2015-03-06 DIAGNOSIS — L97921 Non-pressure chronic ulcer of unspecified part of left lower leg limited to breakdown of skin: Secondary | ICD-10-CM | POA: Diagnosis not present

## 2015-03-06 DIAGNOSIS — I872 Venous insufficiency (chronic) (peripheral): Secondary | ICD-10-CM | POA: Diagnosis not present

## 2015-03-06 DIAGNOSIS — M6281 Muscle weakness (generalized): Secondary | ICD-10-CM | POA: Diagnosis not present

## 2015-03-06 DIAGNOSIS — E1122 Type 2 diabetes mellitus with diabetic chronic kidney disease: Secondary | ICD-10-CM | POA: Diagnosis not present

## 2015-03-06 DIAGNOSIS — L97911 Non-pressure chronic ulcer of unspecified part of right lower leg limited to breakdown of skin: Secondary | ICD-10-CM | POA: Diagnosis not present

## 2015-03-06 DIAGNOSIS — I5042 Chronic combined systolic (congestive) and diastolic (congestive) heart failure: Secondary | ICD-10-CM | POA: Diagnosis not present

## 2015-03-07 DIAGNOSIS — I5042 Chronic combined systolic (congestive) and diastolic (congestive) heart failure: Secondary | ICD-10-CM | POA: Diagnosis not present

## 2015-03-07 DIAGNOSIS — L97911 Non-pressure chronic ulcer of unspecified part of right lower leg limited to breakdown of skin: Secondary | ICD-10-CM | POA: Diagnosis not present

## 2015-03-07 DIAGNOSIS — M6281 Muscle weakness (generalized): Secondary | ICD-10-CM | POA: Diagnosis not present

## 2015-03-07 DIAGNOSIS — L97921 Non-pressure chronic ulcer of unspecified part of left lower leg limited to breakdown of skin: Secondary | ICD-10-CM | POA: Diagnosis not present

## 2015-03-07 DIAGNOSIS — I872 Venous insufficiency (chronic) (peripheral): Secondary | ICD-10-CM | POA: Diagnosis not present

## 2015-03-07 DIAGNOSIS — E1122 Type 2 diabetes mellitus with diabetic chronic kidney disease: Secondary | ICD-10-CM | POA: Diagnosis not present

## 2015-03-14 ENCOUNTER — Ambulatory Visit (HOSPITAL_COMMUNITY)
Admission: RE | Admit: 2015-03-14 | Discharge: 2015-03-14 | Disposition: A | Discharge status: Home / Self Care | Payer: Medicare Other | Source: Ambulatory Visit | Attending: Internal Medicine | Admitting: Internal Medicine

## 2015-03-14 VITALS — BP 142/70 | HR 70 | Wt 228.8 lb

## 2015-03-14 DIAGNOSIS — G4733 Obstructive sleep apnea (adult) (pediatric): Secondary | ICD-10-CM | POA: Diagnosis not present

## 2015-03-14 DIAGNOSIS — Z79899 Other long term (current) drug therapy: Secondary | ICD-10-CM | POA: Diagnosis not present

## 2015-03-14 DIAGNOSIS — Z87891 Personal history of nicotine dependence: Secondary | ICD-10-CM | POA: Insufficient documentation

## 2015-03-14 DIAGNOSIS — N183 Chronic kidney disease, stage 3 (moderate): Secondary | ICD-10-CM | POA: Insufficient documentation

## 2015-03-14 DIAGNOSIS — I130 Hypertensive heart and chronic kidney disease with heart failure and stage 1 through stage 4 chronic kidney disease, or unspecified chronic kidney disease: Secondary | ICD-10-CM | POA: Diagnosis not present

## 2015-03-14 DIAGNOSIS — Z7982 Long term (current) use of aspirin: Secondary | ICD-10-CM | POA: Insufficient documentation

## 2015-03-14 DIAGNOSIS — C946 Myelodysplastic disease, not classified: Secondary | ICD-10-CM | POA: Insufficient documentation

## 2015-03-14 DIAGNOSIS — E1122 Type 2 diabetes mellitus with diabetic chronic kidney disease: Secondary | ICD-10-CM | POA: Insufficient documentation

## 2015-03-14 DIAGNOSIS — I5022 Chronic systolic (congestive) heart failure: Secondary | ICD-10-CM | POA: Insufficient documentation

## 2015-03-14 DIAGNOSIS — I5032 Chronic diastolic (congestive) heart failure: Secondary | ICD-10-CM | POA: Diagnosis not present

## 2015-03-14 MED ORDER — ISOSORBIDE MONONITRATE ER 30 MG PO TB24: 30.0000 mg | ORAL_TABLET | Freq: Every day | ORAL | Status: DC

## 2015-03-14 MED ORDER — TORSEMIDE 20 MG PO TABS: 80.0000 mg | ORAL_TABLET | Freq: Every day | ORAL | Status: DC

## 2015-03-14 MED ORDER — HYDRALAZINE HCL 25 MG PO TABS: 25.0000 mg | ORAL_TABLET | Freq: Three times a day (TID) | ORAL | Status: DC

## 2015-03-14 NOTE — Patient Instructions (Addendum)
Follow up in 4 weeks  Stop Spironolactone  Stop Bidil  Start Hydralazine 25 mg three times daily  Start Imdur 30 mg one tablet daily

## 2015-03-14 NOTE — Progress Notes (Signed)
Advanced Heart Failure Medication Review by a Pharmacist  Does the patient  feel that his/her medications are working for him/her?  yes  Has the patient been experiencing any side effects to the medications prescribed?  no  Does the patient measure his/her own blood pressure or blood glucose at home?  no   Does the patient have any problems obtaining medications due to transportation or finances?   yes  Understanding of regimen: fair Understanding of indications: fair Potential of compliance: fair Patient understands to avoid NSAIDs. Patient understands to avoid decongestants.  Issues to address at subsequent visits: Compliance and Medicare Part D application status   Pharmacist comments:  Mr. Chong is a 74 yo M presenting only with his Bidil bottle. He states that he is taking all of his medications on his list and reports good compliance. We gave him his torsemide at his last visit using petty cash we had. He states that he sent in an application for Part D Rx coverage about 3 days ago and he has not yet heard back from them. We will verify with the pharmacies that he uses about last fill dates for each of his medications and may be able to use our HF fund until his Part D coverage is approved.   [name]. Velva Harman, PharmD, BCPS, CPP Clinical Pharmacist Pager: [phone] Phone: [phone] 03/14/2015 11:26 AM      Time with patient: 10 minutes Preparation and documentation time: 30 minutes Total time: 40 minutes

## 2015-03-14 NOTE — Progress Notes (Signed)
Patient ID: Juan [name], male   DOB: 1941/01/31, 74 y.o.   MRN: [id number]  PCP: Juan [name]: Juan [name]  Primary [name]: Juan [name]   HPI: Juan [name] is a 74 y.o. male with history of Diastolic HF, OSA, HTN, DM2, and myeloproliferative disorder followed by Oncology.   He was seen by onc 10/22/14 for his myeloproliferative disorder. 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   Admitted to [name] with increased dyspnea and volume overload. Diuresed with lasix and transitioned to lasix 40 mg three times a day.   He returns for follow up. Since the last visit he was evaluated [name]. Treated for UTI and completed antibiotic course. Overall feels ok. Limited activity at home due to weakness.  Ongoing dyspnea with exertion. Weight at home 222 pounds. Sleeps in [name]. Appetite ok. He is not taking meds as ordered because he says he cant afford. Attempts have been made by our pharmacist to receive medication assistance from [name] but he wont provide SS #.  Echo 01/21/15 EF 30%, moderate LVH, mild MR, PA peak pressure 59 mm Hg, Normal RV  Labs 01/29/2015: K 3.9 Creatinine 1.59  Labs 02/14/15: K 3.9 Creatinine 1.72  Labs 02/22/2015: K 4.1 Creatinine 1.82   ROS: All systems negative except as listed in HPI, PMH and Problem List.  SH:  Social History   Social History  . Marital Status: Widowed    Spouse Name: N/A  . Number of Children: 2  . Years of Education: N/A   Occupational History  . retired    .     Social History Main Topics  . Smoking status: Former Smoker -- 0.25 packs/day for 12 years    Types: Cigarettes    Quit date: 05/18/1966  . Smokeless tobacco: Never Used     Comment: quit smoking 45 years ago  . Alcohol Use: No  . Drug Use: No  . Sexual Activity: Yes   Other Topics Concern  . Not on file   Social History Narrative   Moved from [name] 2006    Widow 2007   Son lives with him     FH:  Family History  Problem Relation Age of Onset  . Colon cancer Neg Hx   . Prostate cancer Neg Hx   . Heart attack Neg Hx   . Diabetes Neg Hx   . Schizophrenia Son     Past Medical History  Diagnosis Date  . CAD (coronary artery disease)     dx elsewheer in past, no documentation. Non-ischemic myovue 2007  . CHF (congestive heart failure) (HCC)     dx elsewhere, no documentation. Normal EF by previous echo. Trival AS needs f/u ECHO by 2012  . Hypertension   . Sleep apnea, obstructive     at some point used CPAP, was d/c  years ago  . Anemia   . History of thrombocytosis   . Allergic rhinitis   . Edema     R>L leg, u/s 5-12 neg for DVT  . Hemorrhoid   . Type II diabetes mellitus ([place])   . Sinus congestion   . Migraine     "once/wk at least" (07/11/2013)  . Shortness of breath   . Myeloproliferative disease ([name])   . Ascending aortic aneurysm ([place]) 03/2014    4.3cm on CT scan    Current Outpatient Prescriptions  Medication Sig  Dispense Refill  . acetaminophen (TYLENOL) 500 MG tablet Take 500 mg by mouth every 6 (six) hours as needed for moderate pain or headache.     . Ascorbic Acid (VITAMIN C PO) Take 1 tablet by mouth daily.    Marland Kitchen aspirin 325 MG tablet Take 1 tablet (325 mg total) by mouth daily.    Marland Kitchen b complex vitamins capsule Take 1 capsule by mouth daily. 30 capsule 3  . carvedilol (COREG) 3.125 MG tablet Take 1 tablet (3.125 mg total) by mouth 2 (two) times daily with a meal. 60 tablet 0  . feeding supplement, GLUCERNA SHAKE, (GLUCERNA SHAKE) LIQD Take 237 mLs by mouth as needed (protein).    . Ferrous Gluconate (IRON) 240 (27 FE) MG TABS Take 1 tablet by mouth daily.    . fluticasone (FLONASE) 50 MCG/ACT nasal spray Place 2 sprays into both nostrils daily.     Marland Kitchen gabapentin (NEURONTIN) 100 MG capsule Take 100 mg by mouth 3 (three) times daily as needed (pain).    . hydrocerin (EUCERIN) CREA Apply Eucerin cream to BLE Q day  after bathing and roughly towel drying to remove loose skin 228 g 0  . HYDROcodone-acetaminophen (NORCO/VICODIN) 5-325 MG per tablet Take 1 tablet by mouth every 6 (six) hours as needed for moderate pain. 30 tablet 0  . isosorbide-hydrALAZINE (BIDIL) 20-37.5 MG per tablet Take 1 tablet by mouth 3 (three) times daily. 90 tablet 0  . spironolactone (ALDACTONE) 25 MG tablet Take 1 tablet (25 mg total) by mouth daily. 30 tablet 0  . torsemide (DEMADEX) 20 MG tablet Take 4 tablets (80 mg total) by mouth daily. 120 tablet 3  . vitamin B-12 (CYANOCOBALAMIN) 100 MCG tablet Take 100 mcg by mouth daily.     No current facility-administered medications for this encounter.    Filed Vitals:   03/14/15 1107  BP: 142/70  Pulse: 70  Weight: 228 lb 12.8 oz (103.783 kg)  SpO2: 97%    PHYSICAL EXAM: General:  Well appearing. No resp difficulty. Arrived in wheel chair. HEENT: normal Neck: supple. JVP 8-9. Carotids 2+ bilaterally; no bruits. No lymphadenopathy or thryomegaly appreciated. Cor: PMI normal. Regular rate & rhythm. No rubs, gallops or murmurs. Lungs: clear Abdomen: soft, nontender, nondistended. No hepatosplenomegaly. No bruits or masses. Good bowel sounds. Extremities: no cyanosis, clubbing, rash, R and LLE 2-3+ edema Neuro: alert & orientedx3, cranial nerves grossly intact. Moves all 4 extremities w/o difficulty. Affect pleasant   ASSESSMENT & PLAN: 1. Chronic Systolic CHF, EF 01% echo 01/21/15, Normal myoview 2007.  Refuses cath  NYHA III. Volume status improved. Continue torsemide 80 mg daily.  Stop spiro as he is not taking.  Taking coreg 3.125 mg twice a day. Stop bidil. Start hydralazine 25 mg tid and imdur 30 mg daily.  Resistant to [facility] intervention.  He is very difficult to managed given  poor insight related to medications. Offered medication assistance however he refused [employer].  Reinforced daily weights, low salt food choices, and limiting fluid intake < 2 liters per day.   2. CKD stage III - Creatinine baseline 1.7-1.9  4. HTN 5. 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 Does not want further investigation or treatment.    Follow up in 6 weeks. Offered paramedicine however he refuses.   AMY CLEGG, NP-C

## 2015-03-26 DIAGNOSIS — R0989 Other specified symptoms and signs involving the circulatory and respiratory systems: Secondary | ICD-10-CM

## 2015-03-26 NOTE — Progress Notes (Signed)
Patient ID: Juan [name], male   DOB: March 30, 1941, 74 y.o.   MRN: [id number]  PCP: Dr Carles Collet Primary HF Cardiologist: Dr Haroldine Laws  Primary Cardiologist: Dr Johnsie Cancel   HPI: Juan [name] is a 74 y.o.  male with history of Diastolic HF, OSA, HTN, DM2, and myeloproliferative disorder followed by Oncology.   He was seen by onc 10/22/14 for his myeloproliferative disorder. 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   Admitted to [facility] with increased dyspnea and volume overload. Diuresed with lasix and transitioned to lasix 40 mg three times a day.   He returns for follow up. Since the last visit he was evaluated [facility] ED. Treated for UTI and completed antibiotic course. Overall feels ok. Limited activity at home due to weakness.  Ongoing dyspnea with exertion. Weight at home 222 pounds. Sleeps in [facility]. Appetite ok. He is not taking meds as ordered because he says he cant afford. Attempts have been made by our pharmacist to receive medication assistance from [facility] but he wont provide SS #.  Echo 01/21/15 EF 30%, moderate LVH, mild MR, PA peak pressure 59 mm Hg, Normal RV  Labs 01/29/2015: K 3.9 Creatinine 1.59  Labs 02/14/15: K 3.9 Creatinine 1.72  Labs 02/22/2015: K 4.1 Creatinine 1.82   Last visit bidil and aldactone stopped due to compliance issues started on hydralazine and nitrates   ROS: All systems negative except as listed in HPI, PMH and Problem List.  SH:  Social History   Social History  . Marital Status: Widowed    Spouse Name: Juan [name]  . Number of Children: 2  . Years of Education: [name]   Occupational History  . retired    .     Social History Main Topics  . Smoking status: Former Smoker -- 0.25 packs/day for 12 years    Types: Cigarettes    Quit date: 05/18/1966  . Smokeless tobacco: Never Used     Comment: quit smoking 45 years ago  . Alcohol Use: No  . Drug Use: No  . Sexual  Activity: Yes   Other Topics Concern  . Not on file   Social History Narrative   Moved from [place] 2006   Widow 2007   Son lives with him     FH:  Family History  Problem Relation Age of Onset  . Colon cancer Neg Hx   . Prostate cancer Neg Hx   . Heart attack Neg Hx   . Diabetes Neg Hx   . Schizophrenia Son     Past Medical History  Diagnosis Date  . CAD (coronary artery disease)     dx elsewheer in past, no documentation. Non-ischemic myovue 2007  . CHF (congestive heart failure) (HCC)     dx elsewhere, no documentation. Normal EF by previous echo. Trival AS needs f/u ECHO by 2012  . Hypertension   . Sleep apnea, obstructive     at some point used CPAP, was d/c  years ago  . Anemia   . History of thrombocytosis   . Allergic rhinitis   . Edema     R>L leg, u/s 5-12 neg for DVT  . Hemorrhoid   . Type II diabetes mellitus ([place])   . Sinus congestion   . Migraine     "once/wk at least" (07/11/2013)  . Shortness of breath   . Myeloproliferative disease ([place])   . Ascending aortic aneurysm (  [place]) 03/2014    4.3cm on CT scan    Current Outpatient Prescriptions  Medication Sig Dispense Refill  . acetaminophen (TYLENOL) 500 MG tablet Take 500 mg by mouth every 6 (six) hours as needed for moderate pain or headache.     . Ascorbic Acid (VITAMIN C PO) Take 1 tablet by mouth daily.    Marland Kitchen aspirin 325 MG tablet Take 1 tablet (325 mg total) by mouth daily.    Marland Kitchen b complex vitamins capsule Take 1 capsule by mouth daily. 30 capsule 3  . carvedilol (COREG) 3.125 MG tablet Take 1 tablet (3.125 mg total) by mouth 2 (two) times daily with a meal. 60 tablet 0  . feeding supplement, GLUCERNA SHAKE, (GLUCERNA SHAKE) LIQD Take 237 mLs by mouth as needed (protein).    . Ferrous Gluconate (IRON) 240 (27 FE) MG TABS Take 1 tablet by mouth daily.    . fluticasone (FLONASE) 50 MCG/ACT nasal spray Place 2 sprays into both nostrils daily.     Marland Kitchen gabapentin (NEURONTIN) 100 MG capsule Take 100 mg by mouth  3 (three) times daily as needed (pain).    . hydrALAZINE (APRESOLINE) 25 MG tablet Take 1 tablet (25 mg total) by mouth 3 (three) times daily. 90 tablet 3  . hydrocerin (EUCERIN) CREA Apply Eucerin cream to BLE Q day after bathing and roughly towel drying to remove loose skin 228 g 0  . HYDROcodone-acetaminophen (NORCO/VICODIN) 5-325 MG per tablet Take 1 tablet by mouth every 6 (six) hours as needed for moderate pain. 30 tablet 0  . isosorbide mononitrate (IMDUR) 30 MG 24 hr tablet Take 1 tablet (30 mg total) by mouth daily. 30 tablet 3  . torsemide (DEMADEX) 20 MG tablet Take 4 tablets (80 mg total) by mouth daily. 120 tablet 3  . vitamin B-12 (CYANOCOBALAMIN) 100 MCG tablet Take 100 mcg by mouth daily.     No current facility-administered medications for this visit.    There were no vitals filed for this visit.  PHYSICAL EXAM: General:  Well appearing. No resp difficulty. Arrived in wheel chair. HEENT: normal Neck: supple. JVP 8-9. Carotids 2+ bilaterally; no bruits. No lymphadenopathy or thryomegaly appreciated. Cor: PMI normal. Regular rate & rhythm. No rubs, gallops or murmurs. Lungs: clear Abdomen: soft, nontender, nondistended. No hepatosplenomegaly. No bruits or masses. Good bowel sounds. Extremities: no cyanosis, clubbing, rash, R and LLE 2-3+ edema Neuro: alert & orientedx3, cranial nerves grossly intact. Moves all 4 extremities w/o difficulty. Affect pleasant   ASSESSMENT & PLAN: 1. Chronic Systolic CHF, EF 09% echo 01/21/15, Normal myoview 2007.  Refuses cath  NYHA III. Volume status improved. Continue torsemide 80 mg daily.  Taking coreg 3.125 mg twice a day. Continue  hydralazine 25 mg tid and imdur 30 mg daily.  Resistant to [facility] intervention.  He is very difficult to managed given  poor insight related to medications.  Reinforced daily weights, low salt food choices, and limiting fluid intake < 2 liters per day.  2. CKD stage III - Creatinine baseline 1.7-1.9  4. HTN 5.  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 Does not want further investigation or treatment.    F/U CHF clinic 3 months me in 6 months    Jenkins Rouge

## 2015-03-27 ENCOUNTER — Encounter: Payer: Medicare Other | Admitting: Cardiovascular Disease

## 2015-03-28 ENCOUNTER — Encounter: Payer: Self-pay | Admitting: Cardiovascular Disease

## 2015-04-16 ENCOUNTER — Ambulatory Visit (HOSPITAL_COMMUNITY)
Admission: RE | Admit: 2015-04-16 | Discharge: 2015-04-16 | Disposition: A | Discharge status: Home / Self Care | Payer: Medicare Other | Source: Ambulatory Visit | Attending: Cardiology | Admitting: Cardiology

## 2015-04-16 VITALS — BP 142/72 | HR 66 | Wt 216.6 lb

## 2015-04-16 DIAGNOSIS — C946 Myelodysplastic disease, not classified: Secondary | ICD-10-CM | POA: Insufficient documentation

## 2015-04-16 DIAGNOSIS — N183 Chronic kidney disease, stage 3 unspecified: Secondary | ICD-10-CM

## 2015-04-16 DIAGNOSIS — I5032 Chronic diastolic (congestive) heart failure: Secondary | ICD-10-CM | POA: Diagnosis not present

## 2015-04-16 DIAGNOSIS — I2510 Atherosclerotic heart disease of native coronary artery without angina pectoris: Secondary | ICD-10-CM | POA: Diagnosis not present

## 2015-04-16 DIAGNOSIS — Z7982 Long term (current) use of aspirin: Secondary | ICD-10-CM | POA: Insufficient documentation

## 2015-04-16 DIAGNOSIS — Z8249 Family history of ischemic heart disease and other diseases of the circulatory system: Secondary | ICD-10-CM | POA: Insufficient documentation

## 2015-04-16 DIAGNOSIS — I129 Hypertensive chronic kidney disease with stage 1 through stage 4 chronic kidney disease, or unspecified chronic kidney disease: Secondary | ICD-10-CM | POA: Insufficient documentation

## 2015-04-16 DIAGNOSIS — E119 Type 2 diabetes mellitus without complications: Secondary | ICD-10-CM | POA: Diagnosis not present

## 2015-04-16 DIAGNOSIS — Z87891 Personal history of nicotine dependence: Secondary | ICD-10-CM | POA: Insufficient documentation

## 2015-04-16 DIAGNOSIS — Z79899 Other long term (current) drug therapy: Secondary | ICD-10-CM | POA: Insufficient documentation

## 2015-04-16 DIAGNOSIS — Z9119 Patient's noncompliance with other medical treatment and regimen: Secondary | ICD-10-CM | POA: Insufficient documentation

## 2015-04-16 DIAGNOSIS — G4733 Obstructive sleep apnea (adult) (pediatric): Secondary | ICD-10-CM | POA: Diagnosis not present

## 2015-04-16 DIAGNOSIS — Z8719 Personal history of other diseases of the digestive system: Secondary | ICD-10-CM | POA: Insufficient documentation

## 2015-04-16 DIAGNOSIS — I10 Essential (primary) hypertension: Secondary | ICD-10-CM

## 2015-04-16 DIAGNOSIS — D473 Essential (hemorrhagic) thrombocythemia: Secondary | ICD-10-CM | POA: Diagnosis not present

## 2015-04-16 DIAGNOSIS — I5022 Chronic systolic (congestive) heart failure: Secondary | ICD-10-CM | POA: Insufficient documentation

## 2015-04-16 MED ORDER — CARVEDILOL 3.125 MG PO TABS: 3.1250 mg | ORAL_TABLET | Freq: Two times a day (BID) | ORAL | Status: DC

## 2015-04-16 NOTE — Progress Notes (Signed)
Advanced Heart Failure Medication Review by a Pharmacist  Does the patient  feel that his/her medications are working for him/her?  yes  Has the patient been experiencing any side effects to the medications prescribed?  no  Does the patient measure his/her own blood pressure or blood glucose at home?  no   Does the patient have any problems obtaining medications due to transportation or finances?   no  Understanding of regimen: good Understanding of indications: good Potential of compliance: excellent Patient understands to avoid NSAIDs. Patient understands to avoid decongestants.  Issues to address at subsequent visits: Medicare application status   Pharmacist comments:  J[place] [name] is a pleasant 74 yo M presenting with some of his medication bottles. He reports good compliance with his medications except that he has been taking 3 of his torsemide tablets for the last 3 days so that he didn't run out completely before his appointment. He also states that he has not been taking carvedilol because he thought that one of his doctors told him to just take it as needed. He did not have any other medication-related questions or concerns for me at this time.   [name]. Velva Harman, PharmD, BCPS, CPP Clinical Pharmacist Pager: [phone] Phone: [phone] 04/16/2015 10:21 AM      Time with patient: 10 minutes Preparation and documentation time: 20 minutes Total time: 30 minutes

## 2015-04-16 NOTE — Progress Notes (Signed)
Patient ID: Juan [name], male   DOB: 1940/09/27, 74 y.o.   MRN: [id number]  PCP: Dr Carles Collet Primary HF Cardiologist: Dr Haroldine Laws  Primary Cardiologist: Dr Johnsie Cancel   HPI: Mr Juan [name] is a 74 y.o. male with history of Diastolic HF, OSA, HTN, DM2, and myeloproliferative disorder followed by Oncology.   He was seen by onc 10/22/14 for his myeloproliferative disorder. 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   Admitted to [facility] with increased dyspnea and volume overload. Diuresed with lasix and transitioned to lasix 40 mg three times a day.   He returns for follow up. Last visit bidil and spiro stopped. He was started on hydralazine and imdur. After review of medications he is not taking carvedilol because he says that he doesn't need it. Overall feeling pretty good. Says he is able to walk a little bit better. Mild dyspnea with exertion. Denies PND/Orthopnea. Running low on torsemide but going to pick up today. Weight at home 212-213 pounds. Attempts have been made by our pharmacist to receive medication assistance from [place] but he wont provide SS #.  Echo 01/21/15 EF 30%, moderate LVH, mild MR, PA peak pressure 59 mm Hg, Normal RV  Labs 01/29/2015: K 3.9 Creatinine 1.59  Labs 02/14/15: K 3.9 Creatinine 1.72  Labs 02/22/2015: K 4.1 Creatinine 1.82   ROS: All systems negative except as listed in HPI, PMH and Problem List.  SH:  Social History   Social History  . Marital Status: Widowed    Spouse Name: N/A  . Number of Children: 2  . Years of Education: N/A   Occupational History  . retired    .     Social History Main Topics  . Smoking status: Former Smoker -- 0.25 packs/day for 12 years    Types: Cigarettes    Quit date: 05/18/1966  . Smokeless tobacco: Never Used     Comment: quit smoking 45 years ago  . Alcohol Use: No  . Drug Use: No  . Sexual Activity: Yes   Other Topics  Concern  . Not on file   Social History Narrative   Moved from [place] 2006   Widow 2007   Son lives with him     FH:  Family History  Problem Relation Age of Onset  . Colon cancer Neg Hx   . Prostate cancer Neg Hx   . Heart attack Neg Hx   . Diabetes Neg Hx   . Schizophrenia Son     Past Medical History  Diagnosis Date  . CAD (coronary artery disease)     dx elsewheer in past, no documentation. Non-ischemic myovue 2007  . CHF (congestive heart failure) (HCC)     dx elsewhere, no documentation. Normal EF by previous echo. Trival AS needs f/u ECHO by 2012  . Hypertension   . Sleep apnea, obstructive     at some point used CPAP, was d/c  years ago  . Anemia   . History of thrombocytosis   . Allergic rhinitis   . Edema     R>L leg, u/s 5-12 neg for DVT  . Hemorrhoid   . Type II diabetes mellitus ([place])   . Sinus congestion   . Migraine     "once/wk at least" (07/11/2013)  . Shortness of breath   . Myeloproliferative disease ([place])   . Ascending aortic aneurysm ([place]) 03/2014    4.3cm on  CT scan    Current Outpatient Prescriptions  Medication Sig Dispense Refill  . acetaminophen (TYLENOL) 500 MG tablet Take 500 mg by mouth every 6 (six) hours as needed for moderate pain or headache.     . Ascorbic Acid (VITAMIN C PO) Take 1 tablet by mouth daily.    Marland Kitchen aspirin 325 MG tablet Take 1 tablet (325 mg total) by mouth daily.    . feeding supplement, GLUCERNA SHAKE, (GLUCERNA SHAKE) LIQD Take 237 mLs by mouth as needed (protein).    . Ferrous Gluconate (IRON) 240 (27 FE) MG TABS Take 1 tablet by mouth daily.    . fluticasone (FLONASE) 50 MCG/ACT nasal spray Place 2 sprays into both nostrils daily.     Marland Kitchen gabapentin (NEURONTIN) 100 MG capsule Take 100 mg by mouth 3 (three) times daily as needed (pain).    . hydrALAZINE (APRESOLINE) 25 MG tablet Take 1 tablet (25 mg total) by mouth 3 (three) times daily. 90 tablet 3  . hydrocerin (EUCERIN) CREA Apply Eucerin cream to BLE Q day after  bathing and roughly towel drying to remove loose skin 228 g 0  . HYDROcodone-acetaminophen (NORCO/VICODIN) 5-325 MG per tablet Take 1 tablet by mouth every 6 (six) hours as needed for moderate pain. 30 tablet 0  . isosorbide mononitrate (IMDUR) 30 MG 24 hr tablet Take 1 tablet (30 mg total) by mouth daily. 30 tablet 3  . torsemide (DEMADEX) 20 MG tablet Take 4 tablets (80 mg total) by mouth daily. 120 tablet 3  . vitamin B-12 (CYANOCOBALAMIN) 100 MCG tablet Take 100 mcg by mouth daily.    . carvedilol (COREG) 3.125 MG tablet Take 1 tablet (3.125 mg total) by mouth 2 (two) times daily with a meal. (Patient not taking: Reported on 04/16/2015) 60 tablet 0   No current facility-administered medications for this encounter.    Filed Vitals:   04/16/15 1005  BP: 142/72  Pulse: 66  Weight: 216 lb 9.6 oz (98.249 kg)  SpO2: 95%    PHYSICAL EXAM: General:  Well appearing. No resp difficulty. Arrived in wheel chair.  HEENT: normal Neck: supple. JVP 6-7. Carotids 2+ bilaterally; no bruits. No lymphadenopathy or thryomegaly appreciated. Cor: PMI normal. Regular rate & rhythm. No rubs, gallops or murmurs. Lungs: clear Abdomen: soft, nontender, nondistended. No hepatosplenomegaly. No bruits or masses. Good bowel sounds. Extremities: no cyanosis, clubbing, rash, R and LLE 1+  edema Neuro: alert & orientedx3, cranial nerves grossly intact. Moves all 4 extremities w/o difficulty. Affect pleasant   ASSESSMENT & PLAN: 1. Chronic Systolic CHF, EF 94% echo 01/21/15, Normal myoview 2007.  Refuses cath  NYHA III. Appears euvolemic. Continue torsemide 80 mg daily. Off sprio due to noncompliance.   Not taking coreg 3.125 mg twice a day. I have asked him to restart 3.125 mg twice a day.  Continue hydralazine 25 mg tid and imdur 30 mg daily.  Resistant to [facility] intervention. Does not want [facility].  He is very difficult to managed given  poor insight related to medications. Offered medication assistance however he  refused [employer].  Reinforced daily weights, low salt food choices, and limiting fluid intake < 2 liters per day.  2. CKD stage III - Creatinine baseline 1.7-1.9  3. HTN- a little high but starting coreg again as above.  4. 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 Does not want further investigation or treatment.    Follow up 4 weeks and check BMET  AMY CLEGG, NP-C

## 2015-04-16 NOTE — Patient Instructions (Signed)
Follow up in 4 weeks with Amy  Start Coreg 3.125 mg twice a day

## 2015-04-17 ENCOUNTER — Telehealth: Payer: Self-pay | Admitting: Internal Medicine

## 2015-04-17 NOTE — Telephone Encounter (Signed)
.  Patient called and wanted to schedule an appointment, patient is a 30 minute appointment slot and wanted to come in Thu 05/02/2015 at 9:30. Please advise if that slot is ok ?

## 2015-04-17 NOTE — Telephone Encounter (Signed)
error:[id number] ° °

## 2015-04-17 NOTE — Telephone Encounter (Signed)
Okay to schedule

## 2015-04-18 NOTE — Telephone Encounter (Signed)
Appointment scheduled.

## 2015-04-30 ENCOUNTER — Telehealth: Payer: Self-pay | Admitting: Internal Medicine

## 2015-04-30 NOTE — Telephone Encounter (Signed)
Caller name: [name] with [facility] Can be reached: [phone] Fax: [phone]  Reason for call: She said she faxed it yesterday to [phone]. She said it is f/u from September and they need it returned for billing purposes.

## 2015-04-30 NOTE — Telephone Encounter (Signed)
faxed

## 2015-05-01 NOTE — Telephone Encounter (Signed)
Faxes continue to fail, forms mailed to [facility] at [address] [place] [address]. JG//CMA

## 2015-05-02 ENCOUNTER — Encounter: Payer: Self-pay | Admitting: Internal Medicine

## 2015-05-02 ENCOUNTER — Other Ambulatory Visit: Payer: Medicare Other

## 2015-05-02 ENCOUNTER — Ambulatory Visit (INDEPENDENT_AMBULATORY_CARE_PROVIDER_SITE_OTHER): Payer: Medicare Other | Admitting: Internal Medicine

## 2015-05-02 VITALS — BP 128/82 | HR 67 | Temp 97.7°F | Ht 75.0 in | Wt 211.0 lb

## 2015-05-02 DIAGNOSIS — R319 Hematuria, unspecified: Secondary | ICD-10-CM

## 2015-05-02 DIAGNOSIS — D471 Chronic myeloproliferative disease: Secondary | ICD-10-CM

## 2015-05-02 DIAGNOSIS — Z09 Encounter for follow-up examination after completed treatment for conditions other than malignant neoplasm: Secondary | ICD-10-CM

## 2015-05-02 DIAGNOSIS — I712 Thoracic aortic aneurysm, without rupture, unspecified: Secondary | ICD-10-CM

## 2015-05-02 DIAGNOSIS — R7989 Other specified abnormal findings of blood chemistry: Secondary | ICD-10-CM

## 2015-05-02 LAB — URINALYSIS, ROUTINE W REFLEX MICROSCOPIC
Bilirubin Urine: NEGATIVE
HGB URINE DIPSTICK: NEGATIVE
Ketones, ur: NEGATIVE
Leukocytes, UA: NEGATIVE
NITRITE: NEGATIVE
Specific Gravity, Urine: 1.015 (ref 1.000–1.030)
Total Protein, Urine: 100 — AB
Urine Glucose: NEGATIVE
Urobilinogen, UA: 0.2 (ref 0.0–1.0)
pH: 5.5 (ref 5.0–8.0)

## 2015-05-02 NOTE — Patient Instructions (Signed)
Get your blood work before you leave   Next visit  for a routine checkup in 4-5 months    (30 minutes) Please schedule an appointment at the front desk

## 2015-05-02 NOTE — Progress Notes (Signed)
Subjective:    Patient ID: Juan [name], male    DOB: 31-Mar-1941, 75 y.o.   MRN: [id number]  DOS:  05/02/2015 Type of visit - description : Routine checkup Interval history: CHF: Follow-up on the cardiology clinic. Went to the ER in October with gross hematuria, urine showed RBCs and white cells but culture not done Denies any further episodes. Labs reviewed: Hemoglobin trending down, creatinine is stable   Review of Systems  Living at home with his son. Denies any fever chills, no dysuria or gross hematuria since the ER visit. No abdominal pain. Still feeling "weak" most days   Past Medical History  Diagnosis Date  . CAD (coronary artery disease)     dx elsewheer in past, no documentation. Non-ischemic myovue 2007  . CHF (congestive heart failure) (HCC)     dx elsewhere, no documentation. Normal EF by previous echo. Trival AS needs f/u ECHO by 2012  . Hypertension   . Sleep apnea, obstructive     at some point used CPAP, was d/c  years ago  . Anemia   . History of thrombocytosis   . Allergic rhinitis   . Edema     R>L leg, u/s 5-12 neg for DVT  . Hemorrhoid   . Type II diabetes mellitus ([facility])   . Sinus congestion   . Migraine     "once/wk at least" (07/11/2013)  . Shortness of breath   . Myeloproliferative disease ([place])   . Ascending aortic aneurysm ([place]) 03/2014    4.3cm on CT scan    Past Surgical History  Procedure Laterality Date  . Toe surgery Right     "tried to straighten out big toe" (07/11/2013)    Social History   Social History  . Marital Status: Widowed    Spouse Name: N/A  . Number of Children: 2  . Years of Education: N/A   Occupational History  . retired    .     Social History Main Topics  . Smoking status: Former Smoker -- 0.25 packs/day for 12 years    Types: Cigarettes    Quit date: 05/18/1966  . Smokeless tobacco: Never Used     Comment: quit smoking 45 years ago  . Alcohol Use: No  . Drug Use: No  . Sexual Activity: Yes    Other Topics Concern  . Not on file   Social History Narrative   Moved from [place] 2006   Widow 2007   Son lives with him         Medication List       This list is accurate as of: 05/02/15  2:48 PM.  Always use your most recent med list.               acetaminophen 500 MG tablet  Commonly known as:  TYLENOL  Take 500 mg by mouth every 6 (six) hours as needed for moderate pain or headache. Reported on 05/02/2015     aspirin 325 MG tablet  Take 1 tablet (325 mg total) by mouth daily.     carvedilol 3.125 MG tablet  Commonly known as:  COREG  Take 1 tablet (3.125 mg total) by mouth 2 (two) times daily.     feeding supplement (GLUCERNA SHAKE) Liqd  Take 237 mLs by mouth as needed (protein). Reported on 05/02/2015     fluticasone 50 MCG/ACT nasal spray  Commonly known as:  FLONASE  Place 2 sprays into both nostrils daily. Reported on 05/02/2015  gabapentin 100 MG capsule  Commonly known as:  NEURONTIN  Take 100 mg by mouth 3 (three) times daily as needed (pain).     hydrALAZINE 25 MG tablet  Commonly known as:  APRESOLINE  Take 1 tablet (25 mg total) by mouth 3 (three) times daily.     hydrocerin Crea  Apply Eucerin cream to BLE Q day after bathing and roughly towel drying to remove loose skin     HYDROcodone-acetaminophen 5-325 MG tablet  Commonly known as:  NORCO/VICODIN  Take 1 tablet by mouth every 6 (six) hours as needed for moderate pain.     Iron 240 (27 FE) MG Tabs  Take 1 tablet by mouth daily. Reported on 05/02/2015     isosorbide mononitrate 30 MG 24 hr tablet  Commonly known as:  IMDUR  Take 1 tablet (30 mg total) by mouth daily.     torsemide 20 MG tablet  Commonly known as:  DEMADEX  Take 4 tablets (80 mg total) by mouth daily.     vitamin B-12 100 MCG tablet  Commonly known as:  CYANOCOBALAMIN  Take 100 mcg by mouth daily.     VITAMIN C PO  Take 1 tablet by mouth daily. Reported on 05/02/2015           Objective:   Physical  Exam BP 128/82 mmHg  Pulse 67  Temp(Src) 97.7 F (36.5 C) (Oral)  Ht '6\' 3"'$  (1.905 m)  Wt 211 lb (95.709 kg)  BMI 26.37 kg/m2  SpO2 95% General:   Well developed, well nourished-sits in a wheelchair in no distress.  HEENT:  Normocephalic . Face symmetric, atraumatic Lungs:  CTA B Normal respiratory effort, no intercostal retractions, no accessory muscle use. Heart: RRR,  no murmur.  no pretibial edema bilaterally  Abdomen:  Not distended, soft, non-tender. No rebound or rigidity.  Skin: Not pale. Not jaundice Neurologic:  alert & oriented X3.  Speech normal, gait appropriate for age and unassisted Psych--   Cooperative with normal attention span and concentration.  No anxious or depressed appearing.    Assessment & Plan:   Assessment DM Neuropathy, on Neurontin prn and hydrocodone prn HTN CKD, creatinine ~ 1.7-1.9 Chronic edema and stasis dermatitis Morbid obesity Poor compliance with advice: Saw psychiatry 04/07/2014 at the hospital and deemed to be competent to make his own decisions. CV: --CHF, EF 30% 01-2015, normal Myoview 2007, refuse it cardiac catheterization --Ascending aortic aneurysm per CT angio 03-2014, 4.3 cm --CT chest without done 06-2014 --  Refused MRA of the Ao 2016 Hem-onc: ---Myeloproliferative disease , refuses treatment --- anemia --- thrombocytosiscytosis  BPH OSA, uses CPAP at some point  PLAN: Gross hematuria October 2016: Urinalysis showed RBCs , few bacteria, urine culture not done. Now asymptomatic, apparently got a shot of Rocephin at the ER. Plan: Check a UA and urine culture, treat if appropriate CAD: Stable HTN: Well-controlled Ascending aortic aneurysm: We have been unable to get an MRA of the aorta due to patient refusal. RTC 4-5 months

## 2015-05-02 NOTE — Assessment & Plan Note (Signed)
Gross hematuria October 2016: Urinalysis showed RBCs , few bacteria, urine culture not done. Now asymptomatic, apparently got a shot of Rocephin at the ER. Plan: Check a UA and urine culture, treat if appropriate CAD: Stable HTN: Well-controlled Ascending aortic aneurysm: We have been unable to get an MRA of the aorta due to patient refusal.

## 2015-05-02 NOTE — Progress Notes (Signed)
Pre visit review using our clinic review tool, if applicable. No additional management support is needed unless otherwise documented below in the visit note. 

## 2015-05-03 LAB — PATHOLOGIST SMEAR REVIEW

## 2015-05-03 LAB — URINE CULTURE

## 2015-05-23 ENCOUNTER — Ambulatory Visit (HOSPITAL_COMMUNITY)
Admission: RE | Admit: 2015-05-23 | Discharge: 2015-05-23 | Disposition: A | Discharge status: Home / Self Care | Payer: Medicare Other | Source: Ambulatory Visit | Attending: Adult Health | Admitting: Adult Health

## 2015-05-23 VITALS — BP 154/86 | HR 73 | Wt 213.6 lb

## 2015-05-23 DIAGNOSIS — Z9119 Patient's noncompliance with other medical treatment and regimen: Secondary | ICD-10-CM | POA: Diagnosis not present

## 2015-05-23 DIAGNOSIS — I10 Essential (primary) hypertension: Secondary | ICD-10-CM

## 2015-05-23 DIAGNOSIS — I129 Hypertensive chronic kidney disease with stage 1 through stage 4 chronic kidney disease, or unspecified chronic kidney disease: Secondary | ICD-10-CM | POA: Diagnosis not present

## 2015-05-23 DIAGNOSIS — G4733 Obstructive sleep apnea (adult) (pediatric): Secondary | ICD-10-CM | POA: Diagnosis not present

## 2015-05-23 DIAGNOSIS — Z7982 Long term (current) use of aspirin: Secondary | ICD-10-CM | POA: Insufficient documentation

## 2015-05-23 DIAGNOSIS — C946 Myelodysplastic disease, not classified: Secondary | ICD-10-CM | POA: Diagnosis not present

## 2015-05-23 DIAGNOSIS — I5022 Chronic systolic (congestive) heart failure: Secondary | ICD-10-CM | POA: Diagnosis not present

## 2015-05-23 DIAGNOSIS — E119 Type 2 diabetes mellitus without complications: Secondary | ICD-10-CM | POA: Insufficient documentation

## 2015-05-23 DIAGNOSIS — Z79899 Other long term (current) drug therapy: Secondary | ICD-10-CM | POA: Diagnosis not present

## 2015-05-23 DIAGNOSIS — Z87891 Personal history of nicotine dependence: Secondary | ICD-10-CM | POA: Diagnosis not present

## 2015-05-23 DIAGNOSIS — N183 Chronic kidney disease, stage 3 unspecified: Secondary | ICD-10-CM

## 2015-05-23 LAB — BASIC METABOLIC PANEL
Anion gap: 9 (ref 5–15)
BUN: 39 mg/dL — ABNORMAL HIGH (ref 6–20)
CHLORIDE: 108 mmol/L (ref 101–111)
CO2: 24 mmol/L (ref 22–32)
CREATININE: 1.39 mg/dL — AB (ref 0.61–1.24)
Calcium: 9.6 mg/dL (ref 8.9–10.3)
GFR, EST AFRICAN AMERICAN: 56 mL/min — AB (ref >60)
GFR, EST NON AFRICAN AMERICAN: 49 mL/min — AB (ref >60)
Glucose, Bld: 95 mg/dL (ref 65–99)
POTASSIUM: 5.2 mmol/L — AB (ref 3.5–5.1)
SODIUM: 141 mmol/L (ref 135–145)

## 2015-05-23 LAB — BRAIN NATRIURETIC PEPTIDE: B NATRIURETIC PEPTIDE 5: 355.7 pg/mL — AB (ref 0.0–100.0)

## 2015-05-23 MED ORDER — CARVEDILOL 6.25 MG PO TABS: 6.2500 mg | ORAL_TABLET | Freq: Two times a day (BID) | ORAL | Status: DC

## 2015-05-23 NOTE — Progress Notes (Signed)
Advanced Heart Failure Medication Review by a Pharmacist  Does the patient  feel that his/her medications are working for him/her?  yes  Has the patient been experiencing any side effects to the medications prescribed?  no  Does the patient measure his/her own blood pressure or blood glucose at home?  no   Does the patient have any problems obtaining medications due to transportation or finances?   no  Understanding of regimen: good Understanding of indications: good Potential of compliance: good Patient understands to avoid NSAIDs. Patient understands to avoid decongestants.  Issues to address at subsequent visits: Medicare status   Pharmacist comments:  Juan [name] is a pleasant 75 yo M presenting with his medication bottles. All are in date and it seems as if he has been taking them all as prescribed. He states that he has still not heard back from Medicare. Will follow up with the pharmacy to verify whether or not he has active coverage.   [name]. Velva Harman, PharmD, BCPS, CPP Clinical Pharmacist Pager: [phone] Phone: [phone] 05/23/2015 11:05 AM      Time with patient: 8 minutes Preparation and documentation time: 2 minutes Total time: 10 minutes

## 2015-05-23 NOTE — Progress Notes (Signed)
Patient ID: Juan [name], male   DOB: 06-30-1940, 75 y.o.   MRN: [id number]  PCP: Dr Carles Collet Primary HF Cardiologist: Dr Haroldine Laws  Primary Cardiologist: Dr Johnsie Cancel   HPI: Juan [name] is a 75 y.o. male with history of Diastolic HF, OSA, HTN, DM2, and myeloproliferative disorder followed by Oncology.   He was seen by onc 10/22/14 for his myeloproliferative disorder. 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   Admitted to [facility] with increased dyspnea and volume overload. Diuresed with lasix and transitioned to lasix 40 mg three times a day.   He returns for follow up. Last visit carvedilol was restarted at 3.125 mg twice a day. Overall feeling better. Mild dyspnea with exertion. Denies PND/Orthopnea. Weight at home 213 pounds. Tries to follow low salt diet.  Attempts have been made by our pharmacist to receive medication assistance from [facility] but he wont provide SS #. Has difficulty getting rides to clinic.   Echo 01/21/15 EF 30%, moderate LVH, mild MR, PA peak pressure 59 mm Hg, Normal RV  Labs 01/29/2015: K 3.9 Creatinine 1.59  Labs 02/14/15: K 3.9 Creatinine 1.72  Labs 02/22/2015: K 4.1 Creatinine 1.82   ROS: All systems negative except as listed in HPI, PMH and Problem List.  SH:  Social History   Social History  . Marital Status: Widowed    Spouse Name: N/A  . Number of Children: 2  . Years of Education: N/A   Occupational History  . retired    .     Social History Main Topics  . Smoking status: Former Smoker -- 0.25 packs/day for 12 years    Types: Cigarettes    Quit date: 05/18/1966  . Smokeless tobacco: Never Used     Comment: quit smoking 45 years ago  . Alcohol Use: No  . Drug Use: No  . Sexual Activity: Yes   Other Topics Concern  . Not on file   Social History Narrative   Moved from [place] 2006   Widow 2007   Son lives with him     FH:  Family History   Problem Relation Age of Onset  . Colon cancer Neg Hx   . Prostate cancer Neg Hx   . Heart attack Neg Hx   . Diabetes Neg Hx   . Schizophrenia Son     Past Medical History  Diagnosis Date  . CAD (coronary artery disease)     dx elsewheer in past, no documentation. Non-ischemic myovue 2007  . CHF (congestive heart failure) (HCC)     dx elsewhere, no documentation. Normal EF by previous echo. Trival AS needs f/u ECHO by 2012  . Hypertension   . Sleep apnea, obstructive     at some point used CPAP, was d/c  years ago  . Anemia   . History of thrombocytosis   . Allergic rhinitis   . Edema     R>L leg, u/s 5-12 neg for DVT  . Hemorrhoid   . Type II diabetes mellitus ([place])   . Sinus congestion   . Migraine     "once/wk at least" (07/11/2013)  . Shortness of breath   . Myeloproliferative disease ([place])   . Ascending aortic aneurysm ([place]) 03/2014    4.3cm on CT scan    Current Outpatient Prescriptions  Medication Sig Dispense Refill  . acetaminophen (TYLENOL) 500 MG tablet Take 500 mg by mouth every  6 (six) hours as needed for moderate pain or headache. Reported on 05/02/2015    . aspirin 325 MG tablet Take 1 tablet (325 mg total) by mouth daily.    . carvedilol (COREG) 3.125 MG tablet Take 1 tablet (3.125 mg total) by mouth 2 (two) times daily. 60 tablet 5  . feeding supplement, GLUCERNA SHAKE, (GLUCERNA SHAKE) LIQD Take 237 mLs by mouth as needed (protein). Reported on 05/02/2015    . Ferrous Gluconate (IRON) 240 (27 FE) MG TABS Take 1 tablet by mouth daily. Reported on 05/02/2015    . fluticasone (FLONASE) 50 MCG/ACT nasal spray Place 2 sprays into both nostrils daily. Reported on 05/02/2015    . gabapentin (NEURONTIN) 100 MG capsule Take 100 mg by mouth 3 (three) times daily as needed (pain). Reported on 05/23/2015    . hydrALAZINE (APRESOLINE) 25 MG tablet Take 1 tablet (25 mg total) by mouth 3 (three) times daily. 90 tablet 3  . hydrocerin (EUCERIN) CREA Apply Eucerin cream to  BLE Q day after bathing and roughly towel drying to remove loose skin 228 g 0  . HYDROcodone-acetaminophen (NORCO/VICODIN) 5-325 MG per tablet Take 1 tablet by mouth every 6 (six) hours as needed for moderate pain. 30 tablet 0  . isosorbide mononitrate (IMDUR) 30 MG 24 hr tablet Take 1 tablet (30 mg total) by mouth daily. 30 tablet 3  . torsemide (DEMADEX) 20 MG tablet Take 4 tablets (80 mg total) by mouth daily. 120 tablet 3  . vitamin B-12 (CYANOCOBALAMIN) 100 MCG tablet Take 100 mcg by mouth daily.     No current facility-administered medications for this encounter.    Filed Vitals:   05/23/15 1017  BP: 154/86  Pulse: 73  Weight: 213 lb 9.6 oz (96.888 kg)  SpO2: 95%    PHYSICAL EXAM: General:  Well appearing. No resp difficulty. Arrived in wheel chair.  HEENT: normal Neck: supple. JVP 6-7. Carotids 2+ bilaterally; no bruits. No lymphadenopathy or thryomegaly appreciated. Cor: PMI normal. Regular rate & rhythm. No rubs, gallops or murmurs. Lungs: clear Abdomen: soft, nontender, nondistended. No hepatosplenomegaly. No bruits or masses. Good bowel sounds. Extremities: no cyanosis, clubbing, rash, R and LLE  Trace-1+  edema Neuro: alert & orientedx3, cranial nerves grossly intact. Moves all 4 extremities w/o difficulty. Affect pleasant   ASSESSMENT & PLAN: 1. Chronic Systolic CHF, EF 81% echo 01/21/15, Normal myoview 2007.  Refuses cath  NYHA III. Appears euvolemic. Continue torsemide 80 mg daily. Off sprio due to noncompliance.   Increase coreg to 6.25 mg twice a day.  Continue hydralazine 25 mg tid and imdur 30 mg daily.  Check BMET today.  Resistant to [facility] intervention. Does not want [facility]. Does not want paramedicine.  He is very difficult to managed given  poor insight related to medications. Offered medication assistance however he refused [employer].  Reinforced daily weights, low salt food choices, and limiting fluid intake < 2 liters per day.  2. CKD stage III - Creatinine  baseline 1.7-1.9. BMET today.   3. HTN- Continue current dose of hydralazine and imdur. Increase coreg as above.   4. 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 He refuses further work up. Does not want further investigation or treatment.  5. Immunizations: has had flu and pneumococcal vaccine 2016.    Follow up in 4 weeks.  AMY CLEGG, NP-C

## 2015-05-23 NOTE — Patient Instructions (Signed)
INCREASE Coreg to 6.'25mg'$ , one tab twice a day  Labs today  Your physician recommends that you schedule a follow-up appointment in: 4 weeks  Do the following things EVERYDAY: 1) Weigh yourself in the morning before breakfast. Write it down and keep it in a log. 2) Take your medicines as prescribed 3) Eat low salt foods-Limit salt (sodium) to 2000 mg per day.  4) Stay as active as you can everyday 5) Limit all fluids for the day to less than 2 liters 6)

## 2015-06-11 MED FILL — TORSEMIDE 20 MG TABLET: 20 | 30 days supply | Qty: 120 | Fill #3

## 2015-06-11 MED FILL — hydrALAZINE HCL 25 MG TABS: 25 | 30 days supply | Qty: 90 | Fill #3

## 2015-06-11 MED FILL — ISOSORBIDE MN ER 30 MG TAB: 30 | 30 days supply | Qty: 30 | Fill #3

## 2015-06-11 MED FILL — CARVEDILOL 6.25 MG TABLET: 6.25 | 30 days supply | Qty: 60 | Fill #0

## 2015-06-20 ENCOUNTER — Ambulatory Visit (HOSPITAL_COMMUNITY)
Admission: RE | Admit: 2015-06-20 | Discharge: 2015-06-20 | Disposition: A | Discharge status: Home / Self Care | Payer: Medicare Other | Source: Ambulatory Visit | Attending: Internal Medicine | Admitting: Internal Medicine

## 2015-06-20 VITALS — BP 180/100 | HR 77 | Wt 217.6 lb

## 2015-06-20 DIAGNOSIS — I5022 Chronic systolic (congestive) heart failure: Secondary | ICD-10-CM | POA: Diagnosis not present

## 2015-06-20 DIAGNOSIS — E1122 Type 2 diabetes mellitus with diabetic chronic kidney disease: Secondary | ICD-10-CM | POA: Diagnosis not present

## 2015-06-20 DIAGNOSIS — G4733 Obstructive sleep apnea (adult) (pediatric): Secondary | ICD-10-CM | POA: Diagnosis not present

## 2015-06-20 DIAGNOSIS — N183 Chronic kidney disease, stage 3 unspecified: Secondary | ICD-10-CM

## 2015-06-20 DIAGNOSIS — I10 Essential (primary) hypertension: Secondary | ICD-10-CM | POA: Diagnosis not present

## 2015-06-20 DIAGNOSIS — Z9119 Patient's noncompliance with other medical treatment and regimen: Secondary | ICD-10-CM | POA: Insufficient documentation

## 2015-06-20 DIAGNOSIS — C946 Myelodysplastic disease, not classified: Secondary | ICD-10-CM | POA: Diagnosis not present

## 2015-06-20 DIAGNOSIS — Z87891 Personal history of nicotine dependence: Secondary | ICD-10-CM | POA: Diagnosis not present

## 2015-06-20 DIAGNOSIS — I2510 Atherosclerotic heart disease of native coronary artery without angina pectoris: Secondary | ICD-10-CM | POA: Diagnosis not present

## 2015-06-20 DIAGNOSIS — Z7982 Long term (current) use of aspirin: Secondary | ICD-10-CM | POA: Insufficient documentation

## 2015-06-20 DIAGNOSIS — Z79899 Other long term (current) drug therapy: Secondary | ICD-10-CM | POA: Diagnosis not present

## 2015-06-20 DIAGNOSIS — I712 Thoracic aortic aneurysm, without rupture: Secondary | ICD-10-CM | POA: Insufficient documentation

## 2015-06-20 DIAGNOSIS — I130 Hypertensive heart and chronic kidney disease with heart failure and stage 1 through stage 4 chronic kidney disease, or unspecified chronic kidney disease: Secondary | ICD-10-CM | POA: Insufficient documentation

## 2015-06-20 MED ORDER — ISOSORBIDE MONONITRATE ER 30 MG PO TB24: 60.0000 mg | ORAL_TABLET | Freq: Every day | ORAL | Status: DC

## 2015-06-20 MED ORDER — HYDRALAZINE HCL 25 MG PO TABS: 50.0000 mg | ORAL_TABLET | Freq: Three times a day (TID) | ORAL | Status: DC

## 2015-06-20 NOTE — Patient Instructions (Signed)
CHANGE Hydalazine to 50 mg (2 tabs) three times per day CHANGE Imdur to 60 mg (2 tabs) daily  You have been referred to [facility] They will be in touch with you to arrange a home visit   Your physician recommends that you schedule a follow-up appointment in: 4 weeks In the [place] the following things EVERYDAY: 1) Weigh yourself in the morning before breakfast. Write it down and keep it in a log. 2) Take your medicines as prescribed 3) Eat low salt foods-Limit salt (sodium) to 2000 mg per day.  4) Stay as active as you can everyday 5) Limit all fluids for the day to less than 2 liters 6)

## 2015-06-20 NOTE — Progress Notes (Signed)
Patient ID: Juan [name], male   DOB: Sep 13, 1940, 75 y.o.   MRN: [id number]  PCP: Dr Juan [name] Primary HF Cardiologist: Dr Juan [name]  Primary Cardiologist: Dr Juan [name]   HPI: Mr Juan [name] is a 75 y.o. male with history of Diastolic HF, OSA, HTN, DM2, and myeloproliferative disorder followed by Oncology.   He was seen by onc 10/22/14 for his myeloproliferative disorder. 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   Admitted to [facility] with increased dyspnea and volume overload. Diuresed with lasix and transitioned to lasix 40 mg three times a day.   He returns for follow up. Last visit carvedilol was increased to 6.25 mg twice a day. Overall feeling pretty good.  Mild dyspnea with exertion. Denies PND/Orthopnea. Weight at home 213 -216 pounds. Tries to follow low salt diet.  Eating about 3 bananas a week.  Requires assistance with ADLs. He sets up his own medications. Did not have medications this morning.   Echo 01/21/15 EF 30%, moderate LVH, mild MR, PA peak pressure 59 mm Hg, Normal RV  Labs 01/29/2015: K 3.9 Creatinine 1.59  Labs 02/14/15: K 3.9 Creatinine 1.72  Labs 02/22/2015: K 4.1 Creatinine 1.82  Labs 05/23/2015: K 5.2 Creatinine 1.39   ROS: All systems negative except as listed in HPI, PMH and Problem List.  SH:  Social History   Social History  . Marital Status: Widowed    Spouse Name: N/A  . Number of Children: 2  . Years of Education: N/A   Occupational History  . retired    .     Social History Main Topics  . Smoking status: Former Smoker -- 0.25 packs/day for 12 years    Types: Cigarettes    Quit date: 05/18/1966  . Smokeless tobacco: Never Used     Comment: quit smoking 45 years ago  . Alcohol Use: No  . Drug Use: No  . Sexual Activity: Yes   Other Topics Concern  . Not on file   Social History Narrative   Moved from [place] 2006   Widow 2007   Son lives with him     FH:   Family History  Problem Relation Age of Onset  . Colon cancer Neg Hx   . Prostate cancer Neg Hx   . Heart attack Neg Hx   . Diabetes Neg Hx   . Schizophrenia Son     Past Medical History  Diagnosis Date  . CAD (coronary artery disease)     dx elsewheer in past, no documentation. Non-ischemic myovue 2007  . CHF (congestive heart failure) (HCC)     dx elsewhere, no documentation. Normal EF by previous echo. Trival AS needs f/u ECHO by 2012  . Hypertension   . Sleep apnea, obstructive     at some point used CPAP, was d/c  years ago  . Anemia   . History of thrombocytosis   . Allergic rhinitis   . Edema     R>L leg, u/s 5-12 neg for DVT  . Hemorrhoid   . Type II diabetes mellitus ([name])   . Sinus congestion   . Migraine     "once/wk at least" (07/11/2013)  . Shortness of breath   . Myeloproliferative disease ([place])   . Ascending aortic aneurysm ([place]) 03/2014    4.3cm on CT scan    Current Outpatient Prescriptions  Medication Sig Dispense Refill  . acetaminophen (TYLENOL) 500 MG  tablet Take 500 mg by mouth every 6 (six) hours as needed for moderate pain or headache. Reported on 05/02/2015    . aspirin 325 MG tablet Take 1 tablet (325 mg total) by mouth daily.    . carvedilol (COREG) 6.25 MG tablet Take 1 tablet (6.25 mg total) by mouth 2 (two) times daily. 60 tablet 2  . feeding supplement, GLUCERNA SHAKE, (GLUCERNA SHAKE) LIQD Take 237 mLs by mouth as needed (protein). Reported on 05/02/2015    . Ferrous Gluconate (IRON) 240 (27 FE) MG TABS Take 1 tablet by mouth daily. Reported on 05/02/2015    . fluticasone (FLONASE) 50 MCG/ACT nasal spray Place 2 sprays into both nostrils daily. Reported on 05/02/2015    . gabapentin (NEURONTIN) 100 MG capsule Take 100 mg by mouth 3 (three) times daily as needed (pain). Reported on 05/23/2015    . hydrALAZINE (APRESOLINE) 25 MG tablet Take 1 tablet (25 mg total) by mouth 3 (three) times daily. 90 tablet 3  . hydrocerin (EUCERIN) CREA Apply  Eucerin cream to BLE Q day after bathing and roughly towel drying to remove loose skin 228 g 0  . HYDROcodone-acetaminophen (NORCO/VICODIN) 5-325 MG per tablet Take 1 tablet by mouth every 6 (six) hours as needed for moderate pain. 30 tablet 0  . isosorbide mononitrate (IMDUR) 30 MG 24 hr tablet Take 1 tablet (30 mg total) by mouth daily. 30 tablet 3  . torsemide (DEMADEX) 20 MG tablet Take 4 tablets (80 mg total) by mouth daily. 120 tablet 3  . vitamin B-12 (CYANOCOBALAMIN) 100 MCG tablet Take 100 mcg by mouth daily.     No current facility-administered medications for this encounter.    Filed Vitals:   06/20/15 1048  BP: 180/100  Pulse: 77  Weight: 217 lb 9.6 oz (98.703 kg)  SpO2: 95%    PHYSICAL EXAM: General:  Well appearing. No resp difficulty. Arrived in wheel chair.  HEENT: normal Neck: supple. JVP 6-7. Carotids 2+ bilaterally; no bruits. No lymphadenopathy or thryomegaly appreciated. Cor: PMI normal. Regular rate & rhythm. No rubs, gallops or murmurs. Lungs: clear Abdomen: soft, nontender, nondistended. No hepatosplenomegaly. No bruits or masses. Good bowel sounds. Extremities: no cyanosis, clubbing, rash, R and LLE  Trace edema Neuro: alert & orientedx3, cranial nerves grossly intact. Moves all 4 extremities w/o difficulty. Affect pleasant   ASSESSMENT & PLAN: 1. Chronic Systolic CHF, EF 37% echo 01/21/15, Normal myoview 2007.  Refuses cath  NYHA III. Appears euvolemic. Continue torsemide 80 mg daily. Off sprio due to noncompliance.   -Continue coreg to 6.25 mg twice a day.  -Increase hydralazine 50 mg tid and increase imdur 60  mg daily. Medications relabeled in the clinic and he was able to read back instructions.  Today he is agreeable to [facility].  I called [facility] today.  He is very difficult to managed given  poor insight related to medications. Offered medication assistance however he refused [employer].  Reinforced daily  weights, low salt food choices, and limiting fluid intake < 2 liters per day.  2. CKD stage III - Creatinine baseline 1.7-1.9.  3. HTN- Increase hydralazine and imdur as above.    4. 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 He refuses further work up. Does not want further investigation or treatment.  5. Immunizations: has had flu and pneumococcal vaccine 2016.    Follow up in 4 weeks.   Seleny Allbright, NP-C

## 2015-06-26 ENCOUNTER — Telehealth: Payer: Self-pay | Admitting: Internal Medicine

## 2015-06-26 NOTE — Telephone Encounter (Signed)
LMOM informing Manus Gunning that Pt was recently seen on 05/02/2015 and if Pt and/or his family is interested in becoming enrolled that Dr. Larose Kells requests all forms completed and faxed to [place] that way he can just sign them. Office fax number given: [phone] and instructed her to return call if any questions or concerns.

## 2015-06-26 NOTE — Telephone Encounter (Signed)
Caller name: Manus Gunning with [facility] Can be reached: [phone]  Reason for call: Wanting to get pt enrolled in Palliative care program. Please call her back.

## 2015-07-11 MED FILL — CARVEDILOL 6.25 MG TABLET: 6.25 | 30 days supply | Qty: 60 | Fill #1

## 2015-07-11 MED FILL — TORSEMIDE 20 MG TABLET: 20 | 30 days supply | Qty: 120 | Fill #1

## 2015-07-11 MED FILL — ISOSORBIDE MN ER 30 MG TAB: 30 | 30 days supply | Qty: 60 | Fill #0

## 2015-07-18 ENCOUNTER — Encounter (HOSPITAL_COMMUNITY): Payer: Self-pay

## 2015-07-22 NOTE — Progress Notes (Signed)
Patient ID: Juan [name], male   DOB: August 11, 1940, 75 y.o.   MRN: [id number]    Advanced Heart Failure Clinic Note   PCP: Dr Juan [name] Primary HF Cardiologist: Dr Juan [name]  Primary Cardiologist: Dr Juan [name]   HPI: Juan [name] is a 75 y.o. male with history of Diastolic HF, OSA, HTN, DM2, and myeloproliferative disorder followed by Oncology.   He was seen by onc 10/22/14 for his myeloproliferative disorder. 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   Admitted to [facility] with increased dyspnea and volume overload. Diuresed with lasix and transitioned to lasix 40 mg three times a day.   He returns for follow up. Last visit hydralazine and imdur were both increased. Referral was also made to [facility] for in home palliative services. He did not increase his hydralazine to 50 mg BID, despite re-labelling of his meds. Overall feeling fine. Has stable DOE with exertion. Is having some orthopnea, some days worse than others. Weight at home 207-208 lbs.  Tries to watch his salt., Boils meat to get salt off. Eating 1 banana a week.  Can bathe and get dressed himself.  Juan [name] helps with other ADLs.   Echo 01/21/15 EF 30%, moderate LVH, mild MR, PA peak pressure 59 mm Hg, Normal RV  Labs 01/29/2015: K 3.9 Creatinine 1.59  Labs 02/14/15: K 3.9 Creatinine 1.72  Labs 02/22/2015: K 4.1 Creatinine 1.82  Labs 05/23/2015: K 5.2 Creatinine 1.39   ROS: All systems negative except as listed in HPI, PMH and Problem List.  SH:  Social History   Social History  . Marital Status: Widowed    Spouse Name: N/A  . Number of Children: 2  . Years of Education: N/A   Occupational History  . retired    .     Social History Main Topics  . Smoking status: Former Smoker -- 0.25 packs/day for 12 years    Types: Cigarettes    Quit date: 05/18/1966  . Smokeless tobacco: Never Used     Comment: quit smoking 45 years ago  .  Alcohol Use: No  . Drug Use: No  . Sexual Activity: Yes   Other Topics Concern  . Not on file   Social History Narrative   Moved from [place] 2006   Widow 2007   Juan [name] lives with him     FH:  Family History  Problem Relation Age of Onset  . Colon cancer Neg Hx   . Prostate cancer Neg Hx   . Heart attack Neg Hx   . Diabetes Neg Hx   . Juan [name]     Past Medical History  Diagnosis Date  . CAD (coronary artery disease)     dx elsewheer in past, no documentation. Non-ischemic myovue 2007  . CHF (congestive heart failure) (HCC)     dx elsewhere, no documentation. Normal EF by previous echo. Trival AS needs f/u ECHO by 2012  . Hypertension   . Sleep apnea, obstructive     at some point used CPAP, was d/c  years ago  . Anemia   . History of thrombocytosis   . Allergic rhinitis   . Edema     R>L leg, u/s 5-12 neg for DVT  . Hemorrhoid   . Type II diabetes mellitus ([place])   . Sinus congestion   . Migraine     "once/wk at least" (07/11/2013)  . Shortness of breath   .  Myeloproliferative disease ([place])   . Ascending aortic aneurysm ([place]) 03/2014    4.3cm on CT scan    Current Outpatient Prescriptions  Medication Sig Dispense Refill  . acetaminophen (TYLENOL) 500 MG tablet Take 500 mg by mouth every 6 (six) hours as needed for moderate pain or headache. Reported on 05/02/2015    . aspirin 325 MG tablet Take 1 tablet (325 mg total) by mouth daily.    . carvedilol (COREG) 6.25 MG tablet Take 1 tablet (6.25 mg total) by mouth 2 (two) times daily. 60 tablet 2  . feeding supplement, GLUCERNA SHAKE, (GLUCERNA SHAKE) LIQD Take 237 mLs by mouth as needed (protein). Reported on 05/02/2015    . Ferrous Gluconate (IRON) 240 (27 FE) MG TABS Take 1 tablet by mouth daily. Reported on 05/02/2015    . fluticasone (FLONASE) 50 MCG/ACT nasal spray Place 2 sprays into both nostrils daily. Reported on 05/02/2015    . gabapentin (NEURONTIN) 100 MG capsule Take 100 mg by mouth 3 (three) times  daily as needed (pain). Reported on 05/23/2015    . hydrALAZINE (APRESOLINE) 25 MG tablet Take 25 mg by mouth 3 (three) times daily.    . hydrocerin (EUCERIN) CREA Apply Eucerin cream to BLE Q day after bathing and roughly towel drying to remove loose skin 228 g 0  . HYDROcodone-acetaminophen (NORCO/VICODIN) 5-325 MG per tablet Take 1 tablet by mouth every 6 (six) hours as needed for moderate pain. 30 tablet 0  . isosorbide mononitrate (IMDUR) 30 MG 24 hr tablet Take 30 mg by mouth 2 (two) times daily.    Marland Kitchen torsemide (DEMADEX) 20 MG tablet Take 4 tablets (80 mg total) by mouth daily. 120 tablet 3  . vitamin B-12 (CYANOCOBALAMIN) 100 MCG tablet Take 100 mcg by mouth daily.     No current facility-administered medications for this encounter.    Filed Vitals:   07/23/15 1037  BP: 130/72  Pulse: 57  Weight: 219 lb 9.6 oz (99.61 kg)  SpO2: 97%   Wt Readings from Last 3 Encounters:  07/23/15 219 lb 9.6 oz (99.61 kg)  06/20/15 217 lb 9.6 oz (98.703 kg)  05/23/15 213 lb 9.6 oz (96.888 kg)     PHYSICAL EXAM: General:  Well appearing. No resp difficulty. Arrived in wheel chair.  HEENT: normal Neck: supple. JVP 6-7. Carotids 2+ bilaterally; no bruits. No thyromegaly or nodule noted. Cor: PMI normal. RRR. No M/G/R Lungs: CTAB, normal effort Abdomen: soft, NT, ND, no HSM. No bruits or masses. +BS  Extremities: no cyanosis, clubbing, rash, R and LLE  Trace edema Neuro: alert & orientedx3, cranial nerves grossly intact. Moves all 4 extremities w/o difficulty. Affect pleasant   ASSESSMENT & PLAN: 1. Chronic Systolic CHF, EF 72% echo 01/21/15, Normal myoview 2007.  Refuses cath  NYHA III. Appears euvolemic. Continue torsemide 80 mg daily. Off sprio due to noncompliance.   -Continue coreg 6.25 mg twice a day.  -Increase hydralazine to 50 mg tid (he did not do last visit) and imdur 60 mg daily. Medications relabeled in the clinic at last visit and he was able to read back instructions.  -Agreed to  [facility].    He is very difficult to managed given poor insight related to medications. Offered medication assistance however he has refused [employer].  Reinforced daily weights, low salt food choices, and limiting fluid intake < 2 liters per day.  - BMET/BNP today 2. CKD stage III - Creatinine baseline 1.7-1.9. - BMET today.  3. HTN-  Meds as above.     4. 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 - He has, several times, refused further work up. He does not want further investigation or treatment.  5. Immunizations:  - He had flu and pneumococcal vaccine 2016.    Follow up in a month. BMET/BNP today.   Increasing hydralazine as above.   Satira Mccallum Hannan Hutmacher PA-C 07/23/2015

## 2015-07-23 ENCOUNTER — Encounter: Payer: Self-pay | Admitting: Licensed Clinical Social Worker

## 2015-07-23 ENCOUNTER — Ambulatory Visit (HOSPITAL_COMMUNITY)
Admission: RE | Admit: 2015-07-23 | Discharge: 2015-07-23 | Disposition: A | Discharge status: Home / Self Care | Payer: Medicare Other | Source: Ambulatory Visit | Attending: Cardiology | Admitting: Cardiology

## 2015-07-23 VITALS — BP 130/72 | HR 57 | Wt 219.6 lb

## 2015-07-23 DIAGNOSIS — I712 Thoracic aortic aneurysm, without rupture: Secondary | ICD-10-CM | POA: Diagnosis not present

## 2015-07-23 DIAGNOSIS — I130 Hypertensive heart and chronic kidney disease with heart failure and stage 1 through stage 4 chronic kidney disease, or unspecified chronic kidney disease: Secondary | ICD-10-CM | POA: Insufficient documentation

## 2015-07-23 DIAGNOSIS — C946 Myelodysplastic disease, not classified: Secondary | ICD-10-CM | POA: Insufficient documentation

## 2015-07-23 DIAGNOSIS — E1122 Type 2 diabetes mellitus with diabetic chronic kidney disease: Secondary | ICD-10-CM | POA: Diagnosis not present

## 2015-07-23 DIAGNOSIS — N183 Chronic kidney disease, stage 3 unspecified: Secondary | ICD-10-CM

## 2015-07-23 DIAGNOSIS — Z79899 Other long term (current) drug therapy: Secondary | ICD-10-CM | POA: Diagnosis not present

## 2015-07-23 DIAGNOSIS — Z87891 Personal history of nicotine dependence: Secondary | ICD-10-CM | POA: Diagnosis not present

## 2015-07-23 DIAGNOSIS — I10 Essential (primary) hypertension: Secondary | ICD-10-CM

## 2015-07-23 DIAGNOSIS — I5022 Chronic systolic (congestive) heart failure: Secondary | ICD-10-CM | POA: Diagnosis not present

## 2015-07-23 DIAGNOSIS — Z7982 Long term (current) use of aspirin: Secondary | ICD-10-CM | POA: Diagnosis not present

## 2015-07-23 DIAGNOSIS — D471 Chronic myeloproliferative disease: Secondary | ICD-10-CM

## 2015-07-23 DIAGNOSIS — G4733 Obstructive sleep apnea (adult) (pediatric): Secondary | ICD-10-CM | POA: Insufficient documentation

## 2015-07-23 LAB — BASIC METABOLIC PANEL
ANION GAP: 11 (ref 5–15)
BUN: 50 mg/dL — ABNORMAL HIGH (ref 6–20)
CALCIUM: 9.8 mg/dL (ref 8.9–10.3)
CO2: 22 mmol/L (ref 22–32)
CREATININE: 1.5 mg/dL — AB (ref 0.61–1.24)
Chloride: 107 mmol/L (ref 101–111)
GFR, EST AFRICAN AMERICAN: 52 mL/min — AB (ref >60)
GFR, EST NON AFRICAN AMERICAN: 44 mL/min — AB (ref >60)
GLUCOSE: 84 mg/dL (ref 65–99)
Potassium: 5.6 mmol/L — ABNORMAL HIGH (ref 3.5–5.1)
Sodium: 140 mmol/L (ref 135–145)

## 2015-07-23 LAB — BRAIN NATRIURETIC PEPTIDE: B NATRIURETIC PEPTIDE 5: 250 pg/mL — AB (ref 0.0–100.0)

## 2015-07-23 MED ORDER — CARVEDILOL 6.25 MG PO TABS: 6.2500 mg | ORAL_TABLET | Freq: Two times a day (BID) | ORAL | Status: DC

## 2015-07-23 MED ORDER — HYDRALAZINE HCL 50 MG PO TABS: 50.0000 mg | ORAL_TABLET | Freq: Three times a day (TID) | ORAL | Status: DC

## 2015-07-23 NOTE — Progress Notes (Signed)
CSW referred by paramedicine to assist patient with options for transportation and possible PCA services. CSW met with patient in the clinic to discuss resources. Patient reports he needs assistance with bathing particularly with his feet. CSW discussed personal care services and increased services provided through [employer]. CSW also discussed [facility] transport for transport options. Patient reports repeatedly that he does not want to reveal any personal information. He spoke at length about family members who have applied for assistance and had "too much preying into personal business and they needed to get lawyers to fix it". Patient spoke of financial challenges although not interested in applications for [facility] (transportation), [place] or pursuing possible medicaid application. Patient also states refusal for food bank lists as "they make you sign off when you go there to get food and I don't want to sign anything". Patient was in good spirits and spoke at length about his fishing days and life up [place]. CSW unable to make any referrals for assistance per patient's request. CSW will continue to be avilable in the hopes that patient may change his mind and agree for referrals. Raquel Sarna, [place]

## 2015-07-23 NOTE — Patient Instructions (Addendum)
INCREASE Hydralazine to 50 mg, one tab three times per day  Labs today  Your physician recommends that you schedule a follow-up appointment in: 1 month In the [place] following things EVERYDAY: 1) Weigh yourself in the morning before breakfast. Write it down and keep it in a log. 2) Take your medicines as prescribed 3) Eat low salt foods-Limit salt (sodium) to 2000 mg per day.  4) Stay as active as you can everyday 5) Limit all fluids for the day to less than 2 liters 6)

## 2015-07-23 NOTE — Addendum Note (Signed)
Encounter addended by: Orland Penman, CMA on: 07/23/2015 12:36 PM<BR>     Documentation filed: Orders

## 2015-07-23 NOTE — Progress Notes (Signed)
Advanced Heart Failure Medication Review by a Pharmacist  Does the patient  feel that his/her medications are working for him/her?  yes  Has the patient been experiencing any side effects to the medications prescribed?  no  Does the patient measure his/her own blood pressure or blood glucose at home?  no   Does the patient have any problems obtaining medications due to transportation or finances?   no  Understanding of regimen: fair Understanding of indications: fair Potential of compliance: fair Patient understands to avoid NSAIDs. Patient understands to avoid decongestants.  Issues to address at subsequent visits: None   Pharmacist comments: 75 YO pleasant male reporting for HF followup. Pt reports that he takes hydralazine 25 mg TID instead of as the prescribed 50 mg TID. Pt states that he takes Imdur 30 mg BID.  Pt reports no SE to current regimen and reports improve adherence to his medication regimen. Pt reports that he is able to pick up his meds from the outpatient pharmacy when transportation is provided for him.     Time with patient: 10 min Preparation and documentation time: 5 min  Total time: 15 min

## 2015-08-05 MED FILL — hydrALAZINE HCL 25 MG TABS: 25 | 30 days supply | Qty: 180 | Fill #0

## 2015-08-05 MED FILL — TORSEMIDE 20 MG TABLET: 20 | 30 days supply | Qty: 120 | Fill #2

## 2015-08-05 MED FILL — ISOSORBIDE MN ER 30 MG TAB: 30 | 30 days supply | Qty: 60 | Fill #1

## 2015-08-05 MED FILL — CARVEDILOL 6.25 MG TABLET: 6.25 | 30 days supply | Qty: 60 | Fill #2

## 2015-08-15 ENCOUNTER — Telehealth: Payer: Self-pay | Admitting: Internal Medicine

## 2015-08-15 NOTE — Telephone Encounter (Signed)
Caller name: Manus Gunning  Relation to pt: Referral Specialist from [facility]  Call back number: [phone]    Reason for call:  Requesting orders for patient to enroll in Pallative care.

## 2015-08-15 NOTE — Telephone Encounter (Signed)
[place], that Dr. Larose Kells would sign orders, requesting to have forms faxed to [place] at [phone] or to call if she has any further questions or concerns.

## 2015-08-21 ENCOUNTER — Ambulatory Visit (HOSPITAL_COMMUNITY)
Admission: RE | Admit: 2015-08-21 | Discharge: 2015-08-21 | Disposition: A | Discharge status: Home / Self Care | Payer: Medicare Other | Source: Ambulatory Visit | Attending: Cardiology | Admitting: Cardiology

## 2015-08-21 VITALS — BP 130/68 | HR 70 | Wt 211.0 lb

## 2015-08-21 DIAGNOSIS — I2510 Atherosclerotic heart disease of native coronary artery without angina pectoris: Secondary | ICD-10-CM | POA: Diagnosis not present

## 2015-08-21 DIAGNOSIS — G4733 Obstructive sleep apnea (adult) (pediatric): Secondary | ICD-10-CM | POA: Insufficient documentation

## 2015-08-21 DIAGNOSIS — I5022 Chronic systolic (congestive) heart failure: Secondary | ICD-10-CM

## 2015-08-21 DIAGNOSIS — Z79899 Other long term (current) drug therapy: Secondary | ICD-10-CM | POA: Diagnosis not present

## 2015-08-21 DIAGNOSIS — R609 Edema, unspecified: Secondary | ICD-10-CM | POA: Diagnosis not present

## 2015-08-21 DIAGNOSIS — C946 Myelodysplastic disease, not classified: Secondary | ICD-10-CM | POA: Insufficient documentation

## 2015-08-21 DIAGNOSIS — I712 Thoracic aortic aneurysm, without rupture: Secondary | ICD-10-CM | POA: Insufficient documentation

## 2015-08-21 DIAGNOSIS — I10 Essential (primary) hypertension: Secondary | ICD-10-CM

## 2015-08-21 DIAGNOSIS — I130 Hypertensive heart and chronic kidney disease with heart failure and stage 1 through stage 4 chronic kidney disease, or unspecified chronic kidney disease: Secondary | ICD-10-CM | POA: Diagnosis not present

## 2015-08-21 DIAGNOSIS — N183 Chronic kidney disease, stage 3 unspecified: Secondary | ICD-10-CM

## 2015-08-21 DIAGNOSIS — R531 Weakness: Secondary | ICD-10-CM

## 2015-08-21 DIAGNOSIS — Z87891 Personal history of nicotine dependence: Secondary | ICD-10-CM | POA: Diagnosis not present

## 2015-08-21 DIAGNOSIS — R600 Localized edema: Secondary | ICD-10-CM

## 2015-08-21 DIAGNOSIS — Z7982 Long term (current) use of aspirin: Secondary | ICD-10-CM | POA: Diagnosis not present

## 2015-08-21 DIAGNOSIS — E1122 Type 2 diabetes mellitus with diabetic chronic kidney disease: Secondary | ICD-10-CM | POA: Insufficient documentation

## 2015-08-21 LAB — BASIC METABOLIC PANEL
Anion gap: 11 (ref 5–15)
BUN: 56 mg/dL — AB (ref 6–20)
CALCIUM: 9.8 mg/dL (ref 8.9–10.3)
CO2: 25 mmol/L (ref 22–32)
Chloride: 102 mmol/L (ref 101–111)
Creatinine, Ser: 1.92 mg/dL — ABNORMAL HIGH (ref 0.61–1.24)
GFR calc Af Amer: 38 mL/min — ABNORMAL LOW (ref >60)
GFR, EST NON AFRICAN AMERICAN: 33 mL/min — AB (ref >60)
GLUCOSE: 100 mg/dL — AB (ref 65–99)
Potassium: 4.7 mmol/L (ref 3.5–5.1)
Sodium: 138 mmol/L (ref 135–145)

## 2015-08-21 NOTE — Progress Notes (Signed)
Advanced Heart Failure Medication Review by a Pharmacist  Does the patient  feel that his/her medications are working for him/her?  yes  Has the patient been experiencing any side effects to the medications prescribed?  no  Does the patient measure his/her own blood pressure or blood glucose at home?  no   Does the patient have any problems obtaining medications due to transportation or finances?   no  Understanding of regimen: good Understanding of indications: good Potential of compliance: good Patient understands to avoid NSAIDs. Patient understands to avoid decongestants.  Issues to address at subsequent visits: None   Pharmacist comments:  Juan [name] is a pleasant 75 yo M presenting with his medication bottles. He reports excellent compliance with his regimen and did not have any specific medication-related questions or concerns for me at this time.   [name]. Velva Harman, PharmD, BCPS, CPP Clinical Pharmacist Pager: [phone] Phone: [phone] 08/21/2015 10:48 AM      Time with patient: 8 minutes Preparation and documentation time: 2 minutes Total time: 10 minutes

## 2015-08-21 NOTE — Patient Instructions (Signed)
Labs today  Your physician recommends that you schedule a follow-up appointment in: 3 months In the [place] following things EVERYDAY: 1) Weigh yourself in the morning before breakfast. Write it down and keep it in a log. 2) Take your medicines as prescribed 3) Eat low salt foods-Limit salt (sodium) to 2000 mg per day.  4) Stay as active as you can everyday 5) Limit all fluids for the day to less than 2 liters 6)

## 2015-08-21 NOTE — Progress Notes (Signed)
Patient ID: Juan [name], male   DOB: 06/02/1940, 75 y.o.   MRN: [id number]    Advanced Heart Failure Clinic Note   PCP: Dr Juan [name] Primary HF Cardiologist: Dr Juan [name]  Primary Cardiologist: Dr Juan [name]   HPI: Mr Juan [name] is a 75 y.o. male with history of Diastolic HF, OSA, HTN, DM2, and myeloproliferative disorder followed by Oncology.   He was seen by onc 10/22/14 for his myeloproliferative disorder. 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   Admitted to [facility] with increased dyspnea and volume overload. Diuresed with lasix and transitioned to lasix 40 mg three times a day.   He returns today for follow up. Last visit hydralazine increased. BP stable. Weight down 8 lbs from last visit.  Has been working on his diet. Less sugar and salt.  He has had N/V chills for the past few days.  Otherwise has been feeling good.  Taking all medications as directed. Feels like his DOE has improved.  Sometimes gets it with bending over or changing clothes. Limits fluids. Son helps with ADLs  As needed. Says his HHRN runs out this week.   Echo 01/21/15 EF 30%, moderate LVH, mild MR, PA peak pressure 59 mm Hg, Normal RV  Labs 01/29/2015: K 3.9 Creatinine 1.59  Labs 02/14/15: K 3.9 Creatinine 1.72  Labs 02/22/2015: K 4.1 Creatinine 1.82  Labs 05/23/2015: K 5.2 Creatinine 1.39   ROS: All systems negative except as listed in HPI, PMH and Problem List.  SH:  Social History   Social History  . Marital Status: Widowed    Spouse Name: N/A  . Number of Children: 2  . Years of Education: N/A   Occupational History  . retired    .     Social History Main Topics  . Smoking status: Former Smoker -- 0.25 packs/day for 12 years    Types: Cigarettes    Quit date: 05/18/1966  . Smokeless tobacco: Never Used     Comment: quit smoking 45 years ago  . Alcohol Use: No  . Drug Use: No  . Sexual Activity: Yes   Other  Topics Concern  . Not on file   Social History Narrative   Moved from [place] 2006   Widow 2007   Son lives with him     FH:  Family History  Problem Relation Age of Onset  . Colon cancer Neg Hx   . Prostate cancer Neg Hx   . Heart attack Neg Hx   . Diabetes Neg Hx   . Schizophrenia Son     Past Medical History  Diagnosis Date  . CAD (coronary artery disease)     dx elsewheer in past, no documentation. Non-ischemic myovue 2007  . CHF (congestive heart failure) (HCC)     dx elsewhere, no documentation. Normal EF by previous echo. Trival AS needs f/u ECHO by 2012  . Hypertension   . Sleep apnea, obstructive     at some point used CPAP, was d/c  years ago  . Anemia   . History of thrombocytosis   . Allergic rhinitis   . Edema     R>L leg, u/s 5-12 neg for DVT  . Hemorrhoid   . Type II diabetes mellitus ([place])   . Sinus congestion   . Migraine     "once/wk at least" (07/11/2013)  . Shortness of breath   . Myeloproliferative disease ([place])   .  Ascending aortic aneurysm ([place]) 03/2014    4.3cm on CT scan    Current Outpatient Prescriptions  Medication Sig Dispense Refill  . acetaminophen (TYLENOL) 500 MG tablet Take 500 mg by mouth every 6 (six) hours as needed for moderate pain or headache. Reported on 05/02/2015    . aspirin 325 MG tablet Take 1 tablet (325 mg total) by mouth daily.    . carvedilol (COREG) 6.25 MG tablet Take 1 tablet (6.25 mg total) by mouth 2 (two) times daily. 60 tablet 2  . Ferrous Gluconate (IRON) 240 (27 FE) MG TABS Take 1 tablet by mouth daily. Reported on 05/02/2015    . fluticasone (FLONASE) 50 MCG/ACT nasal spray Place 2 sprays into both nostrils daily. Reported on 05/02/2015    . hydrocerin (EUCERIN) CREA Apply Eucerin cream to BLE Q day after bathing and roughly towel drying to remove loose skin 228 g 0  . HYDROcodone-acetaminophen (NORCO/VICODIN) 5-325 MG per tablet Take 1 tablet by mouth every 6 (six) hours as needed for moderate pain. 30 tablet 0   . isosorbide mononitrate (IMDUR) 30 MG 24 hr tablet Take 30 mg by mouth 2 (two) times daily.    . simethicone (MYLICON) 80 MG chewable tablet Chew 80 mg by mouth every 6 (six) hours as needed for flatulence.    . torsemide (DEMADEX) 20 MG tablet Take 4 tablets (80 mg total) by mouth daily. 120 tablet 3  . vitamin B-12 (CYANOCOBALAMIN) 100 MCG tablet Take 100 mcg by mouth daily.    . feeding supplement, GLUCERNA SHAKE, (GLUCERNA SHAKE) LIQD Take 237 mLs by mouth as needed (protein). Reported on 08/21/2015    . gabapentin (NEURONTIN) 100 MG capsule Take 100 mg by mouth 3 (three) times daily as needed (pain). Reported on 08/21/2015    . hydrALAZINE (APRESOLINE) 25 MG tablet Take 50 mg by mouth 3 (three) times daily.  3   No current facility-administered medications for this encounter.    Filed Vitals:   08/21/15 1029  BP: 130/68  Pulse: 70  Weight: 211 lb (95.709 kg)  SpO2: 93%   Wt Readings from Last 3 Encounters:  08/21/15 211 lb (95.709 kg)  07/23/15 219 lb 9.6 oz (99.61 kg)  06/20/15 217 lb 9.6 oz (98.703 kg)     PHYSICAL EXAM: General:  Well appearing. NAD. In WC HEENT: normal Neck: supple. JVP 6-7 cm. Carotids 2+ bilaterally; no bruits. No thyromegaly or lymphadenopathy noted. Cor: PMI normal. RRR. No M/G/R Lungs: Clear, no difficulty Abdomen: soft, non-tender, non-distended, no HSM. No bruits or masses. +BS  Extremities: no cyanosis, clubbing, rash, R and LLE  Chronic venous stasis changes. Trace-1+ edema Neuro: alert & orientedx3, cranial nerves grossly intact. Moves all 4 extremities w/o difficulty. Affect pleasant   ASSESSMENT & PLAN: 1. Chronic Systolic CHF, EF 74% echo 01/21/15, Normal myoview 2007.  Refuses cath  NYHA III. Appears euvolemic. Continue torsemide 80 mg daily. Off sprio due to noncompliance.   - Continue coreg 6.25 mg twice a day.  - Continue hydralazine 50 mg tid and imdur 60 mg daily.  - Will follow up with [facility] to ensure they are  going to see him. - He is very difficult to managed given poor insight related to medications. Have previously offered medication assistance however he has refused [employer].  Reinforced daily weights, low salt food choices, and limiting fluid intake < 2 liters per day.  - BMET today 2. CKD stage III - Creatinine baseline 1.7-1.9. - BMET  today.  3. HTN-  - Meds as above. 4. 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 - He has, several times, refused further work up. He does not want further investigation or treatment.  5. Immunizations:  - He had flu and pneumococcal vaccine 2016.   Follow up in 3 months. BMET today. Will follow up with [facility] to make sure he has someone at home to help him occasionally.    Satira Mccallum Jillienne Egner PA-C 08/21/2015

## 2015-08-22 ENCOUNTER — Encounter: Payer: Self-pay | Admitting: Internal Medicine

## 2015-08-22 ENCOUNTER — Other Ambulatory Visit: Payer: Medicare Other

## 2015-08-22 ENCOUNTER — Ambulatory Visit (INDEPENDENT_AMBULATORY_CARE_PROVIDER_SITE_OTHER): Payer: Medicare Other | Admitting: Internal Medicine

## 2015-08-22 ENCOUNTER — Telehealth: Payer: Self-pay | Admitting: Behavioral Health

## 2015-08-22 VITALS — BP 142/78 | HR 71 | Temp 98.0°F | Ht 75.0 in | Wt 214.1 lb

## 2015-08-22 DIAGNOSIS — I10 Essential (primary) hypertension: Secondary | ICD-10-CM | POA: Diagnosis not present

## 2015-08-22 DIAGNOSIS — M84675P Pathological fracture in other disease, left foot, subsequent encounter for fracture with malunion: Secondary | ICD-10-CM

## 2015-08-22 DIAGNOSIS — D471 Chronic myeloproliferative disease: Secondary | ICD-10-CM

## 2015-08-22 DIAGNOSIS — R739 Hyperglycemia, unspecified: Secondary | ICD-10-CM | POA: Diagnosis not present

## 2015-08-22 DIAGNOSIS — R946 Abnormal results of thyroid function studies: Secondary | ICD-10-CM | POA: Diagnosis not present

## 2015-08-22 DIAGNOSIS — R7989 Other specified abnormal findings of blood chemistry: Secondary | ICD-10-CM

## 2015-08-22 LAB — CBC WITH DIFFERENTIAL/PLATELET
BASOS ABS: 0.1 10*3/uL (ref 0.0–0.1)
BASOS PCT: 0.7 % (ref 0.0–3.0)
EOS ABS: 0.2 10*3/uL (ref 0.0–0.7)
Eosinophils Relative: 0.9 % (ref 0.0–5.0)
LYMPHS PCT: 13 % (ref 12.0–46.0)
Lymphs Abs: 2.5 10*3/uL (ref 0.7–4.0)
MCHC: 31.9 g/dL (ref 30.0–36.0)
MCV: 78.4 fl (ref 78.0–100.0)
MONO ABS: 2.1 10*3/uL — AB (ref 0.1–1.0)
Monocytes Relative: 10.8 % (ref 3.0–12.0)
Neutro Abs: 14.3 10*3/uL — ABNORMAL HIGH (ref 1.4–7.7)
Neutrophils Relative %: 74.6 % (ref 43.0–77.0)
Platelets: 379 10*3/uL (ref 150.0–400.0)
RBC: 3.22 Mil/uL — AB (ref 4.22–5.81)
RDW: 21.8 % — ABNORMAL HIGH (ref 11.5–15.5)

## 2015-08-22 LAB — TSH: TSH: 2.49 u[IU]/mL (ref 0.35–4.50)

## 2015-08-22 LAB — HEMOGLOBIN A1C: HEMOGLOBIN A1C: 6.2 % (ref 4.6–6.5)

## 2015-08-22 NOTE — Telephone Encounter (Signed)
Stable compared to previous labs, waiting for full report

## 2015-08-22 NOTE — Assessment & Plan Note (Signed)
Prediabetes: Check A1c HTN, CKD: Seems stable, BMP yesterday satisfactory Myeloproliferative disease: Check a CBC TSH is slightly high here compared to previous ones---> recheck. Hematuria last year, no further symptoms. Trying to get hospice involved in patient's care RTC 4 months

## 2015-08-22 NOTE — Progress Notes (Signed)
Pre visit review using our clinic review tool, if applicable. No additional management support is needed unless otherwise documented below in the visit note. 

## 2015-08-22 NOTE — Patient Instructions (Signed)
GO TO THE LAB :      Get the blood work     GO TO THE FRONT DESK Schedule your next appointment for a  Follow up When?   4 months Fasting?  No

## 2015-08-22 NOTE — Progress Notes (Signed)
Subjective:    Patient ID: Juan [name], male    DOB: 17-Aug-1940, 75 y.o.   MRN: [id number]  DOS:  08/22/2015 Type of visit - description :  Interval history: Saw cardiology few days ago,felt to be stable. Med list reviewed, reports he is taking them appropriately.   Review of Systems No fevers, occasional chills? No chest pain, shortness of breath at baseline Edema at baseline or even better. No dysuria, gross hematuria difficulty urinating.  Past Medical History  Diagnosis Date  . CAD (coronary artery disease)     dx elsewheer in past, no documentation. Non-ischemic myovue 2007  . CHF (congestive heart failure) (HCC)     dx elsewhere, no documentation. Normal EF by previous echo. Trival AS needs f/u ECHO by 2012  . Hypertension   . Sleep apnea, obstructive     at some point used CPAP, was d/c  years ago  . Anemia   . History of thrombocytosis   . Allergic rhinitis   . Edema     R>L leg, u/s 5-12 neg for DVT  . Hemorrhoid   . Type II diabetes mellitus ([place])   . Sinus congestion   . Migraine     "once/wk at least" (07/11/2013)  . Shortness of breath   . Myeloproliferative disease ([place])   . Ascending aortic aneurysm ([place]) 03/2014    4.3cm on CT scan    Past Surgical History  Procedure Laterality Date  . Toe surgery Right     "tried to straighten out big toe" (07/11/2013)    Social History   Social History  . Marital Status: Widowed    Spouse Name: N/A  . Number of Children: 2  . Years of Education: N/A   Occupational History  . retired    .     Social History Main Topics  . Smoking status: Former Smoker -- 0.25 packs/day for 12 years    Types: Cigarettes    Quit date: 05/18/1966  . Smokeless tobacco: Never Used     Comment: quit smoking 45 years ago  . Alcohol Use: No  . Drug Use: No  . Sexual Activity: Yes   Other Topics Concern  . Not on file   Social History Narrative   Moved from [place] 2006   Widow 2007   Son lives with him           Medication List       This list is accurate as of: 08/22/15  3:20 PM.  Always use your most recent med list.               acetaminophen 500 MG tablet  Commonly known as:  TYLENOL  Take 500 mg by mouth every 6 (six) hours as needed for moderate pain or headache. Reported on 05/02/2015     aspirin 325 MG tablet  Take 1 tablet (325 mg total) by mouth daily.     carvedilol 6.25 MG tablet  Commonly known as:  COREG  Take 1 tablet (6.25 mg total) by mouth 2 (two) times daily.     feeding supplement (GLUCERNA SHAKE) Liqd  Take 237 mLs by mouth as needed (protein). Reported on 08/21/2015     fluticasone 50 MCG/ACT nasal spray  Commonly known as:  FLONASE  Place 2 sprays into both nostrils daily. Reported on 05/02/2015     gabapentin 100 MG capsule  Commonly known as:  NEURONTIN  Take 100 mg by mouth 3 (three) times daily as needed (pain).  Reported on 08/22/2015     hydrALAZINE 25 MG tablet  Commonly known as:  APRESOLINE  Take 50 mg by mouth 3 (three) times daily.     hydrocerin Crea  Apply Eucerin cream to BLE Q day after bathing and roughly towel drying to remove loose skin     HYDROcodone-acetaminophen 5-325 MG tablet  Commonly known as:  NORCO/VICODIN  Take 1 tablet by mouth every 6 (six) hours as needed for moderate pain.     Iron 240 (27 Fe) MG Tabs  Take 1 tablet by mouth daily. Reported on 05/02/2015     isosorbide mononitrate 30 MG 24 hr tablet  Commonly known as:  IMDUR  Take 30 mg by mouth 2 (two) times daily.     simethicone 80 MG chewable tablet  Commonly known as:  MYLICON  Chew 80 mg by mouth every 6 (six) hours as needed for flatulence.     torsemide 20 MG tablet  Commonly known as:  DEMADEX  Take 4 tablets (80 mg total) by mouth daily.     vitamin B-12 100 MCG tablet  Commonly known as:  CYANOCOBALAMIN  Take 100 mcg by mouth daily.           Objective:   Physical Exam BP 142/78 mmHg  Pulse 71  Temp(Src) 98 F (36.7 C) (Oral)  Ht '6\' 3"'$   (1.905 m)  Wt 214 lb 2 oz (97.126 kg)  BMI 26.76 kg/m2  SpO2 93% General:   Well developed, well nourished . NAD.  HEENT:  Normocephalic . Face symmetric, atraumatic Lungs:  CTA B Normal respiratory effort, no intercostal retractions, no accessory muscle use. Heart: RRR,  no murmur.  Chronic skin changes at the lower extremities, no pitting edema, skin is actually hard. No wounds, discharge or oozing Neurologic:  alert & oriented X3.  Speech normal, sitting in a wheelchair without distress. Psych--  No anxious or depressed appearing.      Assessment & Plan:    Assessment Prediabetes  Neuropathy, on Neurontin prn and hydrocodone prn HTN CKD, creatinine ~ 1.7-1.9 Chronic edema and stasis dermatitis Morbid obesity Poor compliance with advice: Saw psychiatry 04/07/2014 at the hospital and deemed to be competent to make his own decisions. CV: --CHF, EF 30% 01-2015, normal Myoview 2007, refuse it cardiac catheterization --Ascending aortic aneurysm per CT angio 03-2014, 4.3 cm --CT chest without done 06-2014 --  Refused MRA of the Ao 2016 Hem-onc: ---Myeloproliferative disease , refuses treatment --- anemia --- thrombocytosiscytosis  BPH OSA, used a CPAP at some point  PLAN: Prediabetes: Check A1c HTN, CKD: Seems stable, BMP yesterday satisfactory Myeloproliferative disease: Check a CBC TSH is slightly high here compared to previous ones---> recheck. Hematuria last year, no further symptoms. Trying to get hospice involved in patient's care RTC 4 months

## 2015-08-22 NOTE — Telephone Encounter (Signed)
Caller: Maryan Rued at [facility]  Reason for call: Critical lab value  She reported that the patient's WBC 19.2. Also, she stated that the hemoglobin was very low at 8.1.

## 2015-08-23 ENCOUNTER — Telehealth: Payer: Self-pay

## 2015-08-23 LAB — PATHOLOGIST SMEAR REVIEW

## 2015-08-23 NOTE — Telephone Encounter (Signed)
Pathologist smear review  Status: Finalresult Visible to patient:  Not Released Nextappt: 12/26/2015 at 10:30 AM in [facility], [place]) Dx:  Path fracture in oth disease, l foot,...    [facility] called to verify that Dr. Larose Kells received the results below.  That was informed that results had been sent to [facility]'s Result Note in-basket.  Message routed to Dr. Larose Kells for [facility].        1d ago    Path Review SEE NOTE   Comments:   Elevated white count with immature granulocytic cells including  blasts, promyelocytes, and myelocytes. NRBCs and abnormal RBC  morphology also noted. Recommend full hematologic evaluation,  if clinically indicated.  Reviewed by Francis Gaines Mammarappallil MD (Electronic Signature on File)  08/22/2016

## 2015-08-23 NOTE — Telephone Encounter (Signed)
Well  known history of myeloproliferative disorder, has refused treatment before. No further evaluation at this time

## 2015-08-26 ENCOUNTER — Encounter (HOSPITAL_COMMUNITY): Payer: Self-pay | Admitting: Cardiology

## 2015-09-03 ENCOUNTER — Telehealth (HOSPITAL_COMMUNITY): Payer: Self-pay | Admitting: *Deleted

## 2015-09-03 MED FILL — TORSEMIDE 20 MG TABLET: 20 | 30 days supply | Qty: 120 | Fill #3

## 2015-09-03 MED FILL — ISOSORBIDE MN ER 30 MG TAB: 30 | 30 days supply | Qty: 60 | Fill #2

## 2015-09-03 MED FILL — CARVEDILOL 6.25 MG TABLET: 6.25 | 30 days supply | Qty: 60 | Fill #0

## 2015-09-03 MED FILL — hydrALAZINE HCL 25 MG TABS: 25 | 30 days supply | Qty: 180 | Fill #1

## 2015-09-03 NOTE — Telephone Encounter (Signed)
Tammy called to let us know that over the weekend pt's wt went up to 209 lb on Sun, their on call MD advised pt to take extra Torsemide and wt is back down to 202 today, which is where he usually runs.  She states pt told her he just received a letter from our office telling him to hold his Torsemide for 2 days and he is unsure of what to do.  Upon review of his chart, pt was in our office 4/5 w/labs which showed elevated bun/cr and we wanted pt to hold meds then but was never able to reach him so letter was mailed.  Advised pt will need repeat labs before decision is made about holding, especially since he just had to take extra.  She states she will get bmet this week, hopefully tomorrow as long as she can reach pt.

## 2015-09-12 ENCOUNTER — Other Ambulatory Visit (HOSPITAL_COMMUNITY): Payer: Self-pay | Admitting: Adult Health

## 2015-09-17 ENCOUNTER — Telehealth: Payer: Self-pay | Admitting: *Deleted

## 2015-09-17 NOTE — Telephone Encounter (Signed)
Forwarded to Dr. Paz. JG//CMA 

## 2015-09-20 ENCOUNTER — Other Ambulatory Visit (HOSPITAL_COMMUNITY): Payer: Self-pay | Admitting: Adult Health

## 2015-10-03 ENCOUNTER — Other Ambulatory Visit (HOSPITAL_COMMUNITY): Payer: Self-pay | Admitting: Cardiology

## 2015-10-03 MED FILL — TORSEMIDE 20 MG TABLET: 20 | 30 days supply | Qty: 120 | Fill #0

## 2015-10-03 MED FILL — ISOSORBIDE MN ER 30 MG TAB: 30 | 30 days supply | Qty: 60 | Fill #3

## 2015-10-03 MED FILL — hydrALAZINE HCL 25 MG TABS: 25 | 30 days supply | Qty: 180 | Fill #2

## 2015-10-03 MED FILL — CARVEDILOL 6.25 MG TABLET: 6.25 | 30 days supply | Qty: 60 | Fill #1

## 2015-10-21 ENCOUNTER — Telehealth: Payer: Self-pay | Admitting: Internal Medicine

## 2015-10-21 NOTE — Telephone Encounter (Signed)
LMOM informing Mechele Claude that okay to continue care for Pt. Instructed her to call if questions or concerns.

## 2015-10-21 NOTE — Telephone Encounter (Signed)
Caller name: Quenton Fetter Relationship to patient: [facility] Can be reached: [phone]   Reason for call: [facility] needs a verbal order to continue the care for this patient

## 2015-10-21 NOTE — Telephone Encounter (Signed)
Please advise 

## 2015-10-21 NOTE — Telephone Encounter (Signed)
Ok verbal order, they provide palliative care

## 2015-10-24 ENCOUNTER — Ambulatory Visit (HOSPITAL_COMMUNITY)
Admission: RE | Admit: 2015-10-24 | Discharge: 2015-10-24 | Disposition: A | Discharge status: Home / Self Care | Payer: Medicare Other | Source: Ambulatory Visit | Attending: Internal Medicine | Admitting: Internal Medicine

## 2015-10-24 VITALS — BP 132/66 | HR 53 | Wt 210.0 lb

## 2015-10-24 DIAGNOSIS — N183 Chronic kidney disease, stage 3 unspecified: Secondary | ICD-10-CM

## 2015-10-24 DIAGNOSIS — I10 Essential (primary) hypertension: Secondary | ICD-10-CM

## 2015-10-24 DIAGNOSIS — Z7982 Long term (current) use of aspirin: Secondary | ICD-10-CM | POA: Insufficient documentation

## 2015-10-24 DIAGNOSIS — C946 Myelodysplastic disease, not classified: Secondary | ICD-10-CM | POA: Diagnosis not present

## 2015-10-24 DIAGNOSIS — Z87891 Personal history of nicotine dependence: Secondary | ICD-10-CM | POA: Diagnosis not present

## 2015-10-24 DIAGNOSIS — I130 Hypertensive heart and chronic kidney disease with heart failure and stage 1 through stage 4 chronic kidney disease, or unspecified chronic kidney disease: Secondary | ICD-10-CM | POA: Diagnosis not present

## 2015-10-24 DIAGNOSIS — I5022 Chronic systolic (congestive) heart failure: Secondary | ICD-10-CM

## 2015-10-24 DIAGNOSIS — Z79899 Other long term (current) drug therapy: Secondary | ICD-10-CM | POA: Insufficient documentation

## 2015-10-24 DIAGNOSIS — I712 Thoracic aortic aneurysm, without rupture: Secondary | ICD-10-CM | POA: Insufficient documentation

## 2015-10-24 DIAGNOSIS — E1122 Type 2 diabetes mellitus with diabetic chronic kidney disease: Secondary | ICD-10-CM | POA: Diagnosis not present

## 2015-10-24 LAB — BASIC METABOLIC PANEL
Anion gap: 7 (ref 5–15)
BUN: 51 mg/dL — AB (ref 6–20)
CALCIUM: 9.8 mg/dL (ref 8.9–10.3)
CO2: 25 mmol/L (ref 22–32)
CREATININE: 1.69 mg/dL — AB (ref 0.61–1.24)
Chloride: 106 mmol/L (ref 101–111)
GFR calc Af Amer: 45 mL/min — ABNORMAL LOW (ref >60)
GFR calc non Af Amer: 38 mL/min — ABNORMAL LOW (ref >60)
GLUCOSE: 101 mg/dL — AB (ref 65–99)
Potassium: 4.6 mmol/L (ref 3.5–5.1)
Sodium: 138 mmol/L (ref 135–145)

## 2015-10-24 NOTE — Progress Notes (Signed)
Patient ID: Juan [name], male   DOB: [birth year], 75 y.o.   MRN: [id number]    Advanced Heart Failure Clinic Note   PCP: Dr Carles Collet Primary HF Cardiologist: Dr Haroldine Laws  Primary Cardiologist: Dr Johnsie Cancel   HPI: Mr Juan [name] is a 75 y.o. male with history of Diastolic HF, OSA, HTN, DM2, and myeloproliferative disorder followed by Oncology.   He was seen by onc 10/22/14 for his myeloproliferative disorder. 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   Admitted to [facility] 01/2015 with increased dyspnea and volume overload. Diuresed with lasix and transitioned to lasix 40 mg three times a day.   He returns today for follow up. Overall feeling good. Mild dyspnea with exertion. Denies PND/Orthopnea. Taking all medications. His son helps with ADLs  as needed. Paramedicine following weekly.   Echo 01/21/15 EF 30%, moderate LVH, mild MR, PA peak pressure 59 mm Hg, Normal RV  Labs 01/29/2015: K 3.9 Creatinine 1.59  Labs 02/14/15: K 3.9 Creatinine 1.72  Labs 02/22/2015: K 4.1 Creatinine 1.82  Labs 05/23/2015: K 5.2 Creatinine 1.39   ROS: All systems negative except as listed in HPI, PMH and Problem List.  SH:  Social History   Social History  . Marital Status: Widowed    Spouse Name: N/A  . Number of Children: 2  . Years of Education: N/A   Occupational History  . retired    .     Social History Main Topics  . Smoking status: Former Smoker -- 0.25 packs/day for 12 years    Types: Cigarettes    Quit date: 05/18/1966  . Smokeless tobacco: Never Used     Comment: quit smoking 45 years ago  . Alcohol Use: No  . Drug Use: No  . Sexual Activity: Yes   Other Topics Concern  . Not on file   Social History Narrative   Moved from [place] 2006   Widow 2007   Son lives with him     FH:  Family History  Problem Relation Age of Onset  . Colon cancer Neg Hx   . Prostate cancer Neg Hx   . Heart attack Neg  Hx   . Diabetes Neg Hx   . Schizophrenia Son     Past Medical History  Diagnosis Date  . CAD (coronary artery disease)     dx elsewheer in past, no documentation. Non-ischemic myovue 2007  . CHF (congestive heart failure) (HCC)     dx elsewhere, no documentation. Normal EF by previous echo. Trival AS needs f/u ECHO by 2012  . Hypertension   . Sleep apnea, obstructive     at some point used CPAP, was d/c  years ago  . Anemia   . History of thrombocytosis   . Allergic rhinitis   . Edema     R>L leg, u/s 5-12 neg for DVT  . Hemorrhoid   . Type II diabetes mellitus ([place])   . Sinus congestion   . Migraine     "once/wk at least" (07/11/2013)  . Shortness of breath   . Myeloproliferative disease ([place])   . Ascending aortic aneurysm ([place]) 03/2014    4.3cm on CT scan    Current Outpatient Prescriptions  Medication Sig Dispense Refill  . acetaminophen (TYLENOL) 500 MG tablet Take 500 mg by mouth every 6 (six) hours as needed for moderate pain or headache. Reported on 05/02/2015    .  aspirin 325 MG tablet Take 1 tablet (325 mg total) by mouth daily.    . carvedilol (COREG) 6.25 MG tablet Take 1 tablet (6.25 mg total) by mouth 2 (two) times daily. 60 tablet 2  . feeding supplement, GLUCERNA SHAKE, (GLUCERNA SHAKE) LIQD Take 237 mLs by mouth as needed (protein). Reported on 08/21/2015    . Ferrous Gluconate (IRON) 240 (27 FE) MG TABS Take 1 tablet by mouth daily. Reported on 05/02/2015    . fluticasone (FLONASE) 50 MCG/ACT nasal spray Place 2 sprays into both nostrils daily. Reported on 05/02/2015    . gabapentin (NEURONTIN) 100 MG capsule Take 100 mg by mouth 3 (three) times daily as needed (pain). Reported on 08/22/2015    . hydrALAZINE (APRESOLINE) 25 MG tablet Take 50 mg by mouth 3 (three) times daily.  3  . hydrocerin (EUCERIN) CREA Apply Eucerin cream to BLE Q day after bathing and roughly towel drying to remove loose skin 228 g 0  . HYDROcodone-acetaminophen (NORCO/VICODIN) 5-325 MG per  tablet Take 1 tablet by mouth every 6 (six) hours as needed for moderate pain. 30 tablet 0  . isosorbide mononitrate (IMDUR) 30 MG 24 hr tablet Take 30 mg by mouth 2 (two) times daily.    . simethicone (MYLICON) 80 MG chewable tablet Chew 80 mg by mouth every 6 (six) hours as needed for flatulence.    . torsemide (DEMADEX) 20 MG tablet Take 4 tablets (80 mg total) by mouth daily. 120 tablet 3  . vitamin B-12 (CYANOCOBALAMIN) 100 MCG tablet Take 100 mcg by mouth daily.     No current facility-administered medications for this encounter.    Filed Vitals:   10/24/15 1129  BP: 132/66  Pulse: 53  Weight: 210 lb (95.255 kg)  SpO2: 96%   Wt Readings from Last 3 Encounters:  10/24/15 210 lb (95.255 kg)  08/22/15 214 lb 2 oz (97.126 kg)  08/21/15 211 lb (95.709 kg)     PHYSICAL EXAM: General:  Well appearing. NAD. In a wheel chair  HEENT: normal Neck: supple. JVP 6-7 cm. Carotids 2+ bilaterally; no bruits. No thyromegaly or lymphadenopathy noted. Cor: PMI normal. RRR. No M/G/R Lungs: Clear, no difficulty Abdomen: soft, non-tender, non-distended, no HSM. No bruits or masses. +BS  Extremities: no cyanosis, clubbing, rash, R and LLE  Chronic venous stasis changes. Trace-1+ edema Neuro: alert & orientedx3, cranial nerves grossly intact. Moves all 4 extremities w/o difficulty. Affect pleasant   ASSESSMENT & PLAN: 1. Chronic Systolic CHF, EF 77% echo 01/21/15, Normal myoview 2007.  Refuses cath  NYHA III. Appears euvolemic. Continue torsemide 80 mg daily. Off sprio due to noncompliance.   - Continue coreg 6.25 mg twice a day. Pulse 53 so will not increase.  - Continue hydralazine 50 mg tid and imdur 60 mg daily.  - Continue [facility].  - He is very difficult to managed given poor insight related to medications. Have previously offered medication assistance however he has refused [employer].  Reinforced daily weights, low salt food choices, and limiting fluid intake < 2 liters per  day.  - BMET today 2. CKD stage III - Creatinine baseline 1.7-1.9.. Most recent creatinine 1.9  - BMET today.  3. HTN-  Stable. Meds as above. 4. 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 - He has, several times, refused further work up. He does not want further investigation or treatment.  5. Immunizations:  - He had flu and pneumococcal vaccine 2016.   Followed by Paramedicine.  Follow up in 2 months with an ECHO and Dr Haroldine Laws .  Amy Clegg  NP-C   10/24/2015

## 2015-10-24 NOTE — Patient Instructions (Signed)
Labs today  Your physician recommends that you schedule a follow-up appointment in: 2 months with echo  Your physician has requested that you have an echocardiogram. Echocardiography is a painless test that uses sound waves to create images of your heart. It provides your doctor with information about the size and shape of your heart and how well your heart's chambers and valves are working. This procedure takes approximately one hour. There are no restrictions for this procedure.  Do the following things EVERYDAY: 1) Weigh yourself in the morning before breakfast. Write it down and keep it in a log. 2) Take your medicines as prescribed 3) Eat low salt foods-Limit salt (sodium) to 2000 mg per day.  4) Stay as active as you can everyday 5) Limit all fluids for the day to less than 2 liters 6)

## 2015-10-24 NOTE — Progress Notes (Signed)
Advanced Heart Failure Medication Review by a Pharmacist  Does the patient  feel that his/her medications are working for him/her?  yes  Has the patient been experiencing any side effects to the medications prescribed?  no  Does the patient measure his/her own blood pressure or blood glucose at home?  no   Does the patient have any problems obtaining medications due to transportation or finances?   no  Understanding of regimen: fair Understanding of indications: fair Potential of compliance: fair Patient understands to avoid NSAIDs. Patient understands to avoid decongestants.  Pharmacist comments: Mr. Spratlin is a pleasant 68 yom presenting to the clinic for a follow-up. He brought his medications with him today and no discrepancies were found. He did, however state that he takes 2 extra torsemide tablets when his weight increases. He did not complain of any medication-related side effects and did not have any medication-related questions during this visit.    Time with patient: 15 min  Preparation and documentation time: 3 min  Total time: 18 min   Madine Sarr C. Lennox Grumbles, PharmD Pharmacy Resident  Pager: [phone] 10/24/2015 11:22 AM

## 2015-10-31 ENCOUNTER — Other Ambulatory Visit (HOSPITAL_COMMUNITY): Payer: Self-pay | Admitting: *Deleted

## 2015-10-31 MED ORDER — ISOSORBIDE MONONITRATE ER 30 MG PO TB24: 30.0000 mg | ORAL_TABLET | Freq: Two times a day (BID) | ORAL | Status: DC

## 2015-10-31 MED FILL — TORSEMIDE 20 MG TABLET: 20 | 30 days supply | Qty: 120 | Fill #1

## 2015-10-31 MED FILL — hydrALAZINE HCL 25 MG TABS: 25 | 30 days supply | Qty: 180 | Fill #3

## 2015-10-31 MED FILL — CARVEDILOL 6.25 MG TABLET: 6.25 | 30 days supply | Qty: 60 | Fill #2

## 2015-10-31 MED FILL — ISOSORBIDE MN ER 30 MG TAB: 30 | 30 days supply | Qty: 60 | Fill #0

## 2015-11-22 ENCOUNTER — Telehealth: Payer: Self-pay | Admitting: *Deleted

## 2015-11-22 NOTE — Telephone Encounter (Signed)
Forwarded to Dr. Paz. JG//CMA 

## 2015-11-26 NOTE — Telephone Encounter (Signed)
Received from Dr. Larose Kells, copies made and sent for scanning. Original forms mailed back to [facility] [address] [place], [place] [address] w/ envelope provided.

## 2015-12-03 ENCOUNTER — Other Ambulatory Visit (HOSPITAL_COMMUNITY): Payer: Self-pay | Admitting: Adult Health

## 2015-12-03 ENCOUNTER — Other Ambulatory Visit (HOSPITAL_COMMUNITY): Payer: Self-pay | Admitting: *Deleted

## 2015-12-03 DIAGNOSIS — I5022 Chronic systolic (congestive) heart failure: Secondary | ICD-10-CM

## 2015-12-03 MED ORDER — CARVEDILOL 6.25 MG PO TABS: 6.2500 mg | ORAL_TABLET | Freq: Two times a day (BID) | ORAL | Status: DC

## 2015-12-03 MED FILL — ISOSORBIDE MN ER 30 MG TAB: 30 | 30 days supply | Qty: 60 | Fill #1

## 2015-12-03 MED FILL — TORSEMIDE 20 MG TABLET: 20 | 30 days supply | Qty: 120 | Fill #2

## 2015-12-03 MED FILL — CARVEDILOL 6.25 MG TABLET: 6.25 | 30 days supply | Qty: 60 | Fill #0

## 2015-12-03 MED FILL — hydrALAZINE HCL 25 MG TABS: 25 | 30 days supply | Qty: 180 | Fill #0

## 2015-12-19 ENCOUNTER — Telehealth: Payer: Self-pay | Admitting: Internal Medicine

## 2015-12-19 NOTE — Telephone Encounter (Signed)
Caller name: Morton Amy with [facility] Can be reached: [phone] for Jeanne Ivan   Reason for call: Pt is due for recertification every 60 days and they are needing order to continue care. They currently go to pts home 1x week to help manage sx of CHF. Please call Tammy back. OK to leave VO on confidential VM. Thank you.

## 2015-12-19 NOTE — Telephone Encounter (Signed)
Okay to continue?

## 2015-12-20 NOTE — Telephone Encounter (Signed)
Yes

## 2015-12-20 NOTE — Telephone Encounter (Signed)
LMOM for Tammy w/ verbal orders to continue Palliative care. Instructed her to call if questions or concerns.

## 2015-12-24 ENCOUNTER — Encounter (HOSPITAL_COMMUNITY): Payer: Self-pay | Admitting: Internal Medicine

## 2015-12-24 ENCOUNTER — Ambulatory Visit (HOSPITAL_BASED_OUTPATIENT_CLINIC_OR_DEPARTMENT_OTHER)
Admission: RE | Admit: 2015-12-24 | Discharge: 2015-12-24 | Disposition: A | Discharge status: Home / Self Care | Payer: Medicare Other | Source: Ambulatory Visit | Attending: Internal Medicine | Admitting: Internal Medicine

## 2015-12-24 ENCOUNTER — Ambulatory Visit (HOSPITAL_COMMUNITY)
Admission: RE | Admit: 2015-12-24 | Discharge: 2015-12-24 | Disposition: A | Discharge status: Home / Self Care | Payer: Medicare Other | Source: Ambulatory Visit | Attending: Internal Medicine | Admitting: Internal Medicine

## 2015-12-24 VITALS — BP 130/60 | HR 62 | Wt 200.8 lb

## 2015-12-24 DIAGNOSIS — I2510 Atherosclerotic heart disease of native coronary artery without angina pectoris: Secondary | ICD-10-CM | POA: Diagnosis not present

## 2015-12-24 DIAGNOSIS — I253 Aneurysm of heart: Secondary | ICD-10-CM | POA: Insufficient documentation

## 2015-12-24 DIAGNOSIS — N183 Chronic kidney disease, stage 3 unspecified: Secondary | ICD-10-CM

## 2015-12-24 DIAGNOSIS — Z79899 Other long term (current) drug therapy: Secondary | ICD-10-CM | POA: Insufficient documentation

## 2015-12-24 DIAGNOSIS — E1122 Type 2 diabetes mellitus with diabetic chronic kidney disease: Secondary | ICD-10-CM | POA: Diagnosis not present

## 2015-12-24 DIAGNOSIS — C946 Myelodysplastic disease, not classified: Secondary | ICD-10-CM | POA: Diagnosis not present

## 2015-12-24 DIAGNOSIS — Z87891 Personal history of nicotine dependence: Secondary | ICD-10-CM | POA: Insufficient documentation

## 2015-12-24 DIAGNOSIS — I059 Rheumatic mitral valve disease, unspecified: Secondary | ICD-10-CM | POA: Insufficient documentation

## 2015-12-24 DIAGNOSIS — I509 Heart failure, unspecified: Secondary | ICD-10-CM | POA: Diagnosis present

## 2015-12-24 DIAGNOSIS — I10 Essential (primary) hypertension: Secondary | ICD-10-CM | POA: Diagnosis not present

## 2015-12-24 DIAGNOSIS — I5022 Chronic systolic (congestive) heart failure: Secondary | ICD-10-CM | POA: Diagnosis not present

## 2015-12-24 DIAGNOSIS — Z7982 Long term (current) use of aspirin: Secondary | ICD-10-CM | POA: Insufficient documentation

## 2015-12-24 DIAGNOSIS — G4733 Obstructive sleep apnea (adult) (pediatric): Secondary | ICD-10-CM | POA: Insufficient documentation

## 2015-12-24 DIAGNOSIS — I130 Hypertensive heart and chronic kidney disease with heart failure and stage 1 through stage 4 chronic kidney disease, or unspecified chronic kidney disease: Secondary | ICD-10-CM | POA: Diagnosis not present

## 2015-12-24 LAB — ECHOCARDIOGRAM COMPLETE
AVLVOTPG: 5 mmHg
CHL CUP MV DEC (S): 257
CHL CUP RV SYS PRESS: 33 mmHg
CHL CUP TV REG PEAK VELOCITY: 248 cm/s
EERAT: 7.87
EWDT: 257 ms
FS: 20 % — AB (ref 28–44)
IV/PV OW: 1.09
LA ID, A-P, ES: 45 mm
LA vol index: 40.3 mL/m2
LA vol: 90.2 mL
LADIAMINDEX: 2.01 cm/m2
LAVOLA4C: 88.8 mL
LEFT ATRIUM END SYS DIAM: 45 mm
LV PW d: 10.1 mm — AB (ref 0.6–1.1)
LV SIMPSON'S DISK: 43
LV TDI E'LATERAL: 10.1
LV dias vol index: 68 mL/m2
LV dias vol: 153 mL — AB (ref 62–150)
LV e' LATERAL: 10.1 cm/s
LV sys vol index: 39 mL/m2
LVEEAVG: 7.87
LVEEMED: 7.87
LVOT SV: 92 mL
LVOT VTI: 22.1 cm
LVOT area: 4.15 cm2
LVOT diameter: 23 mm
LVOT peak vel: 115 cm/s
LVSYSVOL: 87 mL — AB (ref 21–61)
MVPG: 3 mmHg
MVPKAVEL: 96 m/s
MVPKEVEL: 79.5 m/s
RV LATERAL S' VELOCITY: 10.4 cm/s
Stroke v: 66 ml
TAPSE: 26.2 mm
TDI e' medial: 5.98
TRMAXVEL: 248 cm/s

## 2015-12-24 NOTE — Patient Instructions (Signed)
We will contact you in 3 months to schedule your next appointment.  

## 2015-12-24 NOTE — Progress Notes (Signed)
Patient ID: Juan [name], male   DOB: 03/05/1941, 75 y.o.   MRN: [id number] Patient ID: Juan [name], male   DOB: 1940/08/11, 75 y.o.   MRN: [id number]    Advanced Heart Failure Clinic Note   PCP: Dr Carles Collet Primary HF Cardiologist: Dr Haroldine Laws  Primary Cardiologist: Dr Johnsie Cancel   HPI: Juan [name] is a 75 y.o. male with history of Diastolic HF, OSA, HTN, DM2, and myeloproliferative disorder followed by Oncology.   He was seen by onc 10/22/14 for his myeloproliferative disorder. 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   Admitted to [facility] 01/2015 with increased dyspnea and volume overload. Diuresed with lasix and transitioned to lasix 40 mg three times a day.   He returns today for follow up. Overall feeling good. Taking all medications as prescribed. Watching salt intake closely.  Still dyspneic with mild exertion. Denies PND/Orthopnea. Weighs himself once a day. Weight stable around 200-204 pounds.When weight goes up to 207 he call [facility] and takes extra torsemide. His son helps with ADLs  as needed. Paramedicine following weekly.   Echo 12/24/15: 55-60% reviewed personally   Echo 01/21/15 EF 30%, moderate LVH, mild MR, PA peak pressure 59 mm Hg, Normal RV  Labs 01/29/2015: K 3.9 Creatinine 1.59  Labs 02/14/15: K 3.9 Creatinine 1.72  Labs 02/22/2015: K 4.1 Creatinine 1.82  Labs 05/23/2015: K 5.2 Creatinine 1.39   ROS: All systems negative except as listed in HPI, PMH and Problem List.  SH:  Social History   Social History  . Marital status: Widowed    Spouse name: N/A  . Number of children: 2  . Years of education: N/A   Occupational History  . retired    .  Retired   Social History Main Topics  . Smoking status: Former Smoker    Packs/day: 0.25    Years: 12.00    Types: Cigarettes    Quit date: 05/18/1966  . Smokeless tobacco: Never Used     Comment: quit smoking 45 years ago  . Alcohol use  No  . Drug use: No  . Sexual activity: Yes   Other Topics Concern  . Not on file   Social History Narrative   Moved from [place] 2006   Widow 2007   Son lives with him     FH:  Family History  Problem Relation Age of Onset  . Colon cancer Neg Hx   . Prostate cancer Neg Hx   . Heart attack Neg Hx   . Diabetes Neg Hx   . Schizophrenia Son     Past Medical History:  Diagnosis Date  . Allergic rhinitis   . Anemia   . Ascending aortic aneurysm ([place]) 03/2014   4.3cm on CT scan  . CAD (coronary artery disease)    dx elsewheer in past, no documentation. Non-ischemic myovue 2007  . CHF (congestive heart failure) (HCC)    dx elsewhere, no documentation. Normal EF by previous echo. Trival AS needs f/u ECHO by 2012  . Edema    R>L leg, u/s 5-12 neg for DVT  . Hemorrhoid   . History of thrombocytosis   . Hypertension   . Migraine    "once/wk at least" (07/11/2013)  . Myeloproliferative disease ([place])   . Shortness of breath   . Sinus congestion   . Sleep apnea, obstructive    at some point used CPAP, was d/c  years  ago  . Type II diabetes mellitus ([place])     Current Outpatient Prescriptions  Medication Sig Dispense Refill  . acetaminophen (TYLENOL) 500 MG tablet Take 500 mg by mouth every 6 (six) hours as needed for moderate pain or headache. Reported on 05/02/2015    . aspirin 325 MG tablet Take 1 tablet (325 mg total) by mouth daily.    . carvedilol (COREG) 6.25 MG tablet Take 1 tablet (6.25 mg total) by mouth 2 (two) times daily. 60 tablet 2  . feeding supplement, GLUCERNA SHAKE, (GLUCERNA SHAKE) LIQD Take 237 mLs by mouth as needed (protein). Reported on 08/21/2015    . Ferrous Gluconate (IRON) 240 (27 FE) MG TABS Take 1 tablet by mouth daily. Reported on 05/02/2015    . fluticasone (FLONASE) 50 MCG/ACT nasal spray Place 2 sprays into both nostrils daily. Reported on 05/02/2015    . gabapentin (NEURONTIN) 100 MG capsule Take 100 mg by mouth 3 (three) times daily as needed (pain).  Reported on 08/22/2015    . hydrALAZINE (APRESOLINE) 25 MG tablet TAKE 2 TABLETS BY MOUTH 3 TIMES DAILY. 180 tablet 3  . hydrocerin (EUCERIN) CREA Apply Eucerin cream to BLE Q day after bathing and roughly towel drying to remove loose skin 228 g 0  . HYDROcodone-acetaminophen (NORCO/VICODIN) 5-325 MG per tablet Take 1 tablet by mouth every 6 (six) hours as needed for moderate pain. 30 tablet 0  . isosorbide mononitrate (IMDUR) 30 MG 24 hr tablet Take 1 tablet (30 mg total) by mouth 2 (two) times daily. 60 tablet 3  . simethicone (MYLICON) 80 MG chewable tablet Chew 80 mg by mouth every 6 (six) hours as needed for flatulence.    . torsemide (DEMADEX) 20 MG tablet Take 4 tablets (80 mg total) by mouth daily. 120 tablet 3  . vitamin B-12 (CYANOCOBALAMIN) 100 MCG tablet Take 100 mcg by mouth daily.     No current facility-administered medications for this encounter.     Vitals:   12/24/15 1108  BP: 130/60  Pulse: 62  SpO2: 98%  Weight: 200 lb 12 oz (91.1 kg)   Wt Readings from Last 3 Encounters:  12/24/15 200 lb 12 oz (91.1 kg)  10/24/15 210 lb (95.3 kg)  08/22/15 214 lb 2 oz (97.1 kg)     PHYSICAL EXAM: General:  Well appearing. NAD. In a wheel chair  HEENT: normal Neck: supple. JVP 6-7 cm. Carotids 2+ bilaterally; no bruits. No thyromegaly or lymphadenopathy noted. Cor: PMI normal. RRR. No M/G/R Lungs: Clear, no difficulty Abdomen: soft, non-tender, non-distended, no HSM. No bruits or masses. +BS  Extremities: no cyanosis, clubbing, rash, R and LLE  Chronic venous stasis changes. Trace-1+ edema Neuro: alert & orientedx3, cranial nerves grossly intact. Moves all 4 extremities w/o difficulty. Affect pleasant   ASSESSMENT & PLAN: 1. Chronic Systolic CHF, EF 45% echo 01/21/15, echo today with normal EF. 55-60%  Normal myoview 2007.  Refuses cath  NYHA III. Appears euvolemic. Continue torsemide 80 mg daily. Off sprio due to noncompliance.   - Continue coreg 6.25 mg twice a day. Pulse  50-60 so will not increase.  - Continue hydralazine 50 mg tid and imdur 60 mg daily.  - Has not been on ACE/ARB/ARNI due to CKD. - Continue [facility].  - He is very difficult to managed given poor insight related to medications. Have previously offered medication assistance however he has refused [employer].  Reinforced daily weights, low salt food choices, and limiting fluid intake < 2  liters per day.  2. CKD stage III - Creatinine baseline 1.7-1.9.. Most recent creatinine 1.7 - BMET today.  3. HTN-  Stable. Meds as above. 4. 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 - He has, several times, refused further work up. He does not want further investigation or treatment.  5. Immunizations:  - He had flu and pneumococcal vaccine 2016.   Followed by Paramedicine.    Glori Bickers  MD   12/24/15

## 2015-12-24 NOTE — Addendum Note (Signed)
Encounter addended by: Scarlette Calico, RN on: 12/24/2015 11:36 AM<BR>    Actions taken: Sign clinical note

## 2015-12-26 ENCOUNTER — Telehealth: Payer: Self-pay

## 2015-12-26 ENCOUNTER — Encounter: Payer: Self-pay | Admitting: Internal Medicine

## 2015-12-26 ENCOUNTER — Ambulatory Visit (INDEPENDENT_AMBULATORY_CARE_PROVIDER_SITE_OTHER): Payer: Medicare Other | Admitting: Internal Medicine

## 2015-12-26 VITALS — BP 112/74 | HR 56 | Temp 97.6°F | Resp 14 | Ht 75.0 in | Wt 203.5 lb

## 2015-12-26 DIAGNOSIS — E118 Type 2 diabetes mellitus with unspecified complications: Secondary | ICD-10-CM | POA: Diagnosis not present

## 2015-12-26 DIAGNOSIS — D471 Chronic myeloproliferative disease: Secondary | ICD-10-CM

## 2015-12-26 DIAGNOSIS — I5032 Chronic diastolic (congestive) heart failure: Secondary | ICD-10-CM | POA: Diagnosis not present

## 2015-12-26 DIAGNOSIS — N183 Chronic kidney disease, stage 3 unspecified: Secondary | ICD-10-CM

## 2015-12-26 LAB — LIPID PANEL
CHOL/HDL RATIO: 3
CHOLESTEROL: 129 mg/dL (ref 0–200)
HDL: 38.1 mg/dL — ABNORMAL LOW (ref >39.00)
LDL Cholesterol: 73 mg/dL (ref 0–99)
NonHDL: 90.66
TRIGLYCERIDES: 86 mg/dL (ref 0.0–149.0)
VLDL: 17.2 mg/dL (ref 0.0–40.0)

## 2015-12-26 LAB — CBC WITH DIFFERENTIAL/PLATELET
BASOS ABS: 0.3 10*3/uL — AB (ref 0.0–0.1)
BASOS PCT: 1.7 % (ref 0.0–3.0)
Eosinophils Absolute: 0.4 10*3/uL (ref 0.0–0.7)
Eosinophils Relative: 2.4 % (ref 0.0–5.0)
HEMATOCRIT: 25.6 % — AB (ref 39.0–52.0)
HEMOGLOBIN: 8.4 g/dL — AB (ref 13.0–17.0)
LYMPHS PCT: 13.8 % (ref 12.0–46.0)
Lymphs Abs: 2.5 10*3/uL (ref 0.7–4.0)
MCHC: 32.8 g/dL (ref 30.0–36.0)
MCV: 78.7 fl (ref 78.0–100.0)
MONOS PCT: 7.6 % (ref 3.0–12.0)
Monocytes Absolute: 1.4 10*3/uL — ABNORMAL HIGH (ref 0.1–1.0)
NEUTROS ABS: 13.5 10*3/uL — AB (ref 1.4–7.7)
Neutrophils Relative %: 74.5 % (ref 43.0–77.0)
PLATELETS: 614 10*3/uL — AB (ref 150.0–400.0)
RBC: 3.25 Mil/uL — ABNORMAL LOW (ref 4.22–5.81)
RDW: 20.2 % — ABNORMAL HIGH (ref 11.5–15.5)
WBC: 18.1 10*3/uL (ref 4.0–10.5)

## 2015-12-26 LAB — AST: AST: 23 U/L (ref 0–37)

## 2015-12-26 LAB — HEMOGLOBIN A1C: Hgb A1c MFr Bld: 6.1 % (ref 4.6–6.5)

## 2015-12-26 LAB — ALT: ALT: 10 U/L (ref 0–53)

## 2015-12-26 NOTE — Telephone Encounter (Signed)
Spoke w/ [name] at [place] lab, Critical WBC of 18.1.

## 2015-12-26 NOTE — Progress Notes (Addendum)
Subjective:    Patient ID: Juan [name], male    DOB: 1941-01-24, 75 y.o.   MRN: [id number]  DOS:  12/26/2015 Type of visit - description : Routine office visit Interval history: Feels well, no major concerns CHF: Note from cardiology reviewed. HTN: Reports good compliance of medication, BP today is very good, last BMP satisfactory Prediabetes: Due for foot exam and A1c Poor compliance with advice: f/u by  palliative care, pt reports they are doing a great job   Review of Systems  No chest pain, shortness of breath at baseline. Edema at baseline. No nausea, vomiting, diarrhea Past Medical History:  Diagnosis Date  . Allergic rhinitis   . Anemia   . Ascending aortic aneurysm ([place]) 03/2014   4.3cm on CT scan  . CAD (coronary artery disease)    dx elsewheer in past, no documentation. Non-ischemic myovue 2007  . CHF (congestive heart failure) (HCC)    dx elsewhere, no documentation. Normal EF by previous echo. Trival AS needs f/u ECHO by 2012  . Edema    R>L leg, u/s 5-12 neg for DVT  . Hemorrhoid   . History of thrombocytosis   . Hypertension   . Migraine    "once/wk at least" (07/11/2013)  . Myeloproliferative disease ([place])   . Shortness of breath   . Sinus congestion   . Sleep apnea, obstructive    at some point used CPAP, was d/c  years ago  . Type II diabetes mellitus ([place])     Past Surgical History:  Procedure Laterality Date  . TOE SURGERY Right    "tried to straighten out big toe" (07/11/2013)    Social History   Social History  . Marital status: Widowed    Spouse name: N/A  . Number of children: 2  . Years of education: N/A   Occupational History  . retired    .  Retired   Social History Main Topics  . Smoking status: Former Smoker    Packs/day: 0.25    Years: 12.00    Types: Cigarettes    Quit date: 05/18/1966  . Smokeless tobacco: Never Used     Comment: quit smoking 45 years ago  . Alcohol use No  . Drug use: No  . Sexual activity: Yes   Other  Topics Concern  . Not on file   Social History Narrative   Moved from [place] 2006   Widow 2007   Son lives with him         Medication List       Accurate as of 12/26/15 10:19 AM. Always use your most recent med list.          acetaminophen 500 MG tablet Commonly known as:  TYLENOL Take 500 mg by mouth every 6 (six) hours as needed for moderate pain or headache. Reported on 05/02/2015   aspirin 325 MG tablet Take 1 tablet (325 mg total) by mouth daily.   carvedilol 6.25 MG tablet Commonly known as:  COREG Take 1 tablet (6.25 mg total) by mouth 2 (two) times daily.   feeding supplement (GLUCERNA SHAKE) Liqd Take 237 mLs by mouth as needed (protein). Reported on 08/21/2015   fluticasone 50 MCG/ACT nasal spray Commonly known as:  FLONASE Place 2 sprays into both nostrils daily. Reported on 05/02/2015   gabapentin 100 MG capsule Commonly known as:  NEURONTIN Take 100 mg by mouth 3 (three) times daily as needed (pain). Reported on 08/22/2015   hydrALAZINE 25 MG tablet Commonly  known as:  APRESOLINE TAKE 2 TABLETS BY MOUTH 3 TIMES DAILY.   hydrocerin Crea Apply Eucerin cream to BLE Q day after bathing and roughly towel drying to remove loose skin   HYDROcodone-acetaminophen 5-325 MG tablet Commonly known as:  NORCO/VICODIN Take 1 tablet by mouth every 6 (six) hours as needed for moderate pain.   Iron 240 (27 Fe) MG Tabs Take 1 tablet by mouth daily. Reported on 05/02/2015   isosorbide mononitrate 30 MG 24 hr tablet Commonly known as:  IMDUR Take 1 tablet (30 mg total) by mouth 2 (two) times daily.   simethicone 80 MG chewable tablet Commonly known as:  MYLICON Chew 80 mg by mouth every 6 (six) hours as needed for flatulence.   torsemide 20 MG tablet Commonly known as:  DEMADEX Take 4 tablets (80 mg total) by mouth daily.   vitamin B-12 100 MCG tablet Commonly known as:  CYANOCOBALAMIN Take 100 mcg by mouth daily.          Objective:   Physical Exam BP  112/74 (BP Location: Right Arm, Patient Position: Sitting, Cuff Size: Normal)   Pulse (!) 56   Temp 97.6 F (36.4 C) (Oral)   Resp 14   Ht '6\' 3"'$  (1.905 m)   Wt 203 lb 8 oz (92.3 kg)   SpO2 96%   BMI 25.44 kg/m  General:   Well developed, obese patient, sitting in wheelchair in no distress.  HEENT:  Normocephalic . Face symmetric, atraumatic Lungs:  CTA B Normal respiratory effort, no intercostal retractions, no accessory muscle use. Heart: RRR,  no murmur.  Chronic skin changes with no redness or TTP. Mild edema noted. Neurologic:  alert & oriented X3.  Speech normal, gait tested, on a wheelchair. Psych-- No anxious or depressed appearing.      Assessment & Plan:    Assessment Prediabetes  Neuropathy, on Neurontin prn and hydrocodone prn HTN CKD, creatinine ~ 1.7-1.9 Chronic edema and stasis dermatitis Morbid obesity Poor compliance with advice: Saw psychiatry 04/07/2014 at the hospital and deemed to be competent to make his own decisions. CV: --CHF, EF 30% 01-2015, normal Myoview 2007, refuse it cardiac catheterization --Ascending aortic aneurysm per CT angio 03-2014, 4.3 cm --CT chest without done 06-2014 --  Refused MRA of the Ao 2016 Hem-onc: ---Myeloproliferative disease , refuses treatment --- anemia --- thrombocytosiscytosis  BPH OSA, used a CPAP at some point  PLAN: Prediabetes: Feet exam negative, he has good sensitivity but has painful neuropathy on gabapentin and hydrocodone as needed. HTN: Seems controlled, last BMP satisfactory CKD: Last creatinine stable Poor compliance with advice: Now follow-up by hospice, they checked on him once a week,  Now they check every 2 weeks, he is supposed to communicate with them if his weight goes up and take a  extra Lasix if needed CHF:  last note from cardiology reviewed. Myeloproliferative disease: Check a CBC, has not seen hematology in a while, he seems to be doing okay. RTC 4-6 months

## 2015-12-26 NOTE — Patient Instructions (Signed)
GO TO THE LAB : Get the blood work     GO TO THE FRONT DESK Schedule your next appointment for a   routine checkup in 4 months 

## 2015-12-26 NOTE — Progress Notes (Signed)
Pre visit review using our clinic review tool, if applicable. No additional management support is needed unless otherwise documented below in the visit note. 

## 2015-12-27 NOTE — Telephone Encounter (Signed)
At baseline 

## 2015-12-27 NOTE — Assessment & Plan Note (Signed)
Prediabetes: Feet exam negative, he has good sensitivity but has painful neuropathy on gabapentin and hydrocodone as needed. HTN: Seems controlled, last BMP satisfactory CKD: Last creatinine stable Poor compliance with advice: Now follow-up by hospice, they checked on him once a week,  Now they check every 2 weeks, he is supposed to communicate with them if his weight goes up and take a  extra Lasix if needed CHF:  last note from cardiology reviewed. Myeloproliferative disease: Check a CBC, has not seen hematology in a while, he seems to be doing okay. RTC 4-6 months

## 2015-12-29 ENCOUNTER — Emergency Department (HOSPITAL_COMMUNITY): Payer: Medicare Other

## 2015-12-29 ENCOUNTER — Encounter (HOSPITAL_COMMUNITY): Payer: Self-pay | Admitting: Emergency Medicine

## 2015-12-29 ENCOUNTER — Inpatient Hospital Stay (HOSPITAL_COMMUNITY)
Admission: EM | Admit: 2015-12-29 | Discharge: 2016-01-04 | DRG: 291 | Disposition: A | Discharge status: Home Health | Payer: Medicare Other | Attending: Internal Medicine | Admitting: Internal Medicine

## 2015-12-29 DIAGNOSIS — N390 Urinary tract infection, site not specified: Secondary | ICD-10-CM | POA: Diagnosis not present

## 2015-12-29 DIAGNOSIS — R9431 Abnormal electrocardiogram [ECG] [EKG]: Secondary | ICD-10-CM | POA: Diagnosis not present

## 2015-12-29 DIAGNOSIS — I130 Hypertensive heart and chronic kidney disease with heart failure and stage 1 through stage 4 chronic kidney disease, or unspecified chronic kidney disease: Secondary | ICD-10-CM | POA: Diagnosis not present

## 2015-12-29 DIAGNOSIS — R600 Localized edema: Secondary | ICD-10-CM | POA: Diagnosis present

## 2015-12-29 DIAGNOSIS — I5023 Acute on chronic systolic (congestive) heart failure: Secondary | ICD-10-CM | POA: Diagnosis present

## 2015-12-29 DIAGNOSIS — E1122 Type 2 diabetes mellitus with diabetic chronic kidney disease: Secondary | ICD-10-CM | POA: Diagnosis present

## 2015-12-29 DIAGNOSIS — I2510 Atherosclerotic heart disease of native coronary artery without angina pectoris: Secondary | ICD-10-CM | POA: Diagnosis present

## 2015-12-29 DIAGNOSIS — D7581 Myelofibrosis: Secondary | ICD-10-CM | POA: Diagnosis present

## 2015-12-29 DIAGNOSIS — R531 Weakness: Secondary | ICD-10-CM | POA: Diagnosis not present

## 2015-12-29 DIAGNOSIS — I10 Essential (primary) hypertension: Secondary | ICD-10-CM | POA: Diagnosis not present

## 2015-12-29 DIAGNOSIS — N183 Chronic kidney disease, stage 3 (moderate): Secondary | ICD-10-CM | POA: Diagnosis present

## 2015-12-29 DIAGNOSIS — N179 Acute kidney failure, unspecified: Secondary | ICD-10-CM | POA: Diagnosis present

## 2015-12-29 DIAGNOSIS — I5043 Acute on chronic combined systolic (congestive) and diastolic (congestive) heart failure: Secondary | ICD-10-CM | POA: Diagnosis present

## 2015-12-29 DIAGNOSIS — R609 Edema, unspecified: Secondary | ICD-10-CM | POA: Diagnosis present

## 2015-12-29 DIAGNOSIS — E875 Hyperkalemia: Secondary | ICD-10-CM | POA: Diagnosis present

## 2015-12-29 DIAGNOSIS — G4733 Obstructive sleep apnea (adult) (pediatric): Secondary | ICD-10-CM | POA: Diagnosis present

## 2015-12-29 DIAGNOSIS — L8992 Pressure ulcer of unspecified site, stage 2: Secondary | ICD-10-CM | POA: Diagnosis present

## 2015-12-29 DIAGNOSIS — D72829 Elevated white blood cell count, unspecified: Secondary | ICD-10-CM | POA: Diagnosis present

## 2015-12-29 DIAGNOSIS — C946 Myelodysplastic disease, not classified: Secondary | ICD-10-CM | POA: Diagnosis present

## 2015-12-29 DIAGNOSIS — R030 Elevated blood-pressure reading, without diagnosis of hypertension: Secondary | ICD-10-CM | POA: Diagnosis not present

## 2015-12-29 DIAGNOSIS — I878 Other specified disorders of veins: Secondary | ICD-10-CM | POA: Diagnosis present

## 2015-12-29 DIAGNOSIS — I872 Venous insufficiency (chronic) (peripheral): Secondary | ICD-10-CM | POA: Diagnosis present

## 2015-12-29 DIAGNOSIS — R05 Cough: Secondary | ICD-10-CM | POA: Diagnosis not present

## 2015-12-29 DIAGNOSIS — Z87891 Personal history of nicotine dependence: Secondary | ICD-10-CM

## 2015-12-29 DIAGNOSIS — M109 Gout, unspecified: Secondary | ICD-10-CM | POA: Diagnosis present

## 2015-12-29 DIAGNOSIS — Z79899 Other long term (current) drug therapy: Secondary | ICD-10-CM

## 2015-12-29 DIAGNOSIS — Z9114 Patient's other noncompliance with medication regimen: Secondary | ICD-10-CM

## 2015-12-29 DIAGNOSIS — Z7982 Long term (current) use of aspirin: Secondary | ICD-10-CM

## 2015-12-29 LAB — BRAIN NATRIURETIC PEPTIDE: B NATRIURETIC PEPTIDE 5: 228.8 pg/mL — AB (ref 0.0–100.0)

## 2015-12-29 LAB — URINALYSIS, ROUTINE W REFLEX MICROSCOPIC
BILIRUBIN URINE: NEGATIVE
GLUCOSE, UA: NEGATIVE mg/dL
Hgb urine dipstick: NEGATIVE
KETONES UR: NEGATIVE mg/dL
Nitrite: NEGATIVE
PH: 5.5 (ref 5.0–8.0)
Protein, ur: 100 mg/dL — AB
Specific Gravity, Urine: 1.011 (ref 1.005–1.030)

## 2015-12-29 LAB — COMPREHENSIVE METABOLIC PANEL
ALBUMIN: 3.3 g/dL — AB (ref 3.5–5.0)
ALT: 13 U/L — AB (ref 17–63)
AST: 28 U/L (ref 15–41)
Alkaline Phosphatase: 70 U/L (ref 38–126)
Anion gap: 8 (ref 5–15)
BUN: 71 mg/dL — ABNORMAL HIGH (ref 6–20)
CHLORIDE: 103 mmol/L (ref 101–111)
CO2: 23 mmol/L (ref 22–32)
CREATININE: 1.82 mg/dL — AB (ref 0.61–1.24)
Calcium: 9.7 mg/dL (ref 8.9–10.3)
GFR calc non Af Amer: 35 mL/min — ABNORMAL LOW (ref >60)
GFR, EST AFRICAN AMERICAN: 40 mL/min — AB (ref >60)
GLUCOSE: 118 mg/dL — AB (ref 65–99)
Potassium: 4.8 mmol/L (ref 3.5–5.1)
SODIUM: 134 mmol/L — AB (ref 135–145)
Total Bilirubin: 0.5 mg/dL (ref 0.3–1.2)
Total Protein: 6.8 g/dL (ref 6.5–8.1)

## 2015-12-29 LAB — CBC WITH DIFFERENTIAL/PLATELET
BASOS PCT: 9 %
Band Neutrophils: 0 %
Basophils Absolute: 1.6 10*3/uL — ABNORMAL HIGH (ref 0.0–0.1)
Blasts: 0 %
EOS PCT: 2 %
Eosinophils Absolute: 0.4 10*3/uL (ref 0.0–0.7)
HEMATOCRIT: 27.4 % — AB (ref 39.0–52.0)
Hemoglobin: 8.5 g/dL — ABNORMAL LOW (ref 13.0–17.0)
LYMPHS ABS: 4.4 10*3/uL — AB (ref 0.7–4.0)
LYMPHS PCT: 25 %
MCH: 25.1 pg — ABNORMAL LOW (ref 26.0–34.0)
MCHC: 31 g/dL (ref 30.0–36.0)
MCV: 81.1 fL (ref 78.0–100.0)
MONO ABS: 1.1 10*3/uL — AB (ref 0.1–1.0)
MYELOCYTES: 0 %
Metamyelocytes Relative: 0 %
Monocytes Relative: 6 %
NEUTROS PCT: 58 %
NRBC: 0 /100{WBCs}
Neutro Abs: 10 10*3/uL — ABNORMAL HIGH (ref 1.7–7.7)
OTHER: 0 %
Platelets: 466 10*3/uL — ABNORMAL HIGH (ref 150–400)
Promyelocytes Absolute: 0 %
RBC: 3.38 MIL/uL — ABNORMAL LOW (ref 4.22–5.81)
RDW: 19.3 % — AB (ref 11.5–15.5)
WBC: 17.5 10*3/uL — ABNORMAL HIGH (ref 4.0–10.5)

## 2015-12-29 LAB — URINE MICROSCOPIC-ADD ON
RBC / HPF: NONE SEEN RBC/hpf (ref 0–5)
Squamous Epithelial / LPF: NONE SEEN

## 2015-12-29 LAB — I-STAT TROPONIN, ED: Troponin i, poc: 0.01 ng/mL (ref 0.00–0.08)

## 2015-12-29 LAB — CBC
HCT: 28.2 % — ABNORMAL LOW (ref 39.0–52.0)
Hemoglobin: 8.6 g/dL — ABNORMAL LOW (ref 13.0–17.0)
MCH: 24.7 pg — AB (ref 26.0–34.0)
MCHC: 30.5 g/dL (ref 30.0–36.0)
MCV: 81 fL (ref 78.0–100.0)
PLATELETS: 409 10*3/uL — AB (ref 150–400)
RBC: 3.48 MIL/uL — ABNORMAL LOW (ref 4.22–5.81)
RDW: 19.1 % — AB (ref 11.5–15.5)
WBC: 14.7 10*3/uL — ABNORMAL HIGH (ref 4.0–10.5)

## 2015-12-29 LAB — TROPONIN I
Troponin I: <0.03 ng/mL (ref <0.03)
Troponin I: <0.03 ng/mL (ref <0.03)

## 2015-12-29 LAB — I-STAT CG4 LACTIC ACID, ED
LACTIC ACID, VENOUS: 0.52 mmol/L (ref 0.5–1.9)
LACTIC ACID, VENOUS: 0.35 mmol/L — AB (ref 0.5–1.9)

## 2015-12-29 MED ORDER — ENOXAPARIN SODIUM 40 MG/0.4ML ~~LOC~~ SOLN
40.0000 mg | SUBCUTANEOUS | Status: DC
  Administered 2015-12-30 – 2016-01-02 (×4): 40 mg via SUBCUTANEOUS
  Filled 2015-12-29 (×5): qty 0.4

## 2015-12-29 MED ORDER — TRAZODONE HCL 50 MG PO TABS
25.0000 mg | ORAL_TABLET | Freq: Every evening | ORAL | Status: DC | PRN
  Administered 2015-12-29: 25 mg via ORAL
  Filled 2015-12-29: qty 1

## 2015-12-29 MED ORDER — GLUCERNA SHAKE PO LIQD: 237.0000 mL | ORAL | Status: DC | PRN

## 2015-12-29 MED ORDER — SODIUM CHLORIDE 0.9 % IV BOLUS (SEPSIS): 1000.0000 mL | Freq: Once | INTRAVENOUS | Status: DC

## 2015-12-29 MED ORDER — BISACODYL 5 MG PO TBEC
5.0000 mg | DELAYED_RELEASE_TABLET | Freq: Every day | ORAL | Status: DC | PRN
  Administered 2016-01-03 – 2016-01-04 (×2): 5 mg via ORAL
  Filled 2015-12-29 (×3): qty 1

## 2015-12-29 MED ORDER — GABAPENTIN 100 MG PO CAPS
100.0000 mg | ORAL_CAPSULE | Freq: Three times a day (TID) | ORAL | Status: DC | PRN
  Administered 2015-12-29: 100 mg via ORAL
  Filled 2015-12-29: qty 1

## 2015-12-29 MED ORDER — ACETAMINOPHEN 650 MG RE SUPP: 650.0000 mg | Freq: Four times a day (QID) | RECTAL | Status: DC | PRN

## 2015-12-29 MED ORDER — GUAIFENESIN ER 600 MG PO TB12
600.0000 mg | ORAL_TABLET | Freq: Two times a day (BID) | ORAL | Status: DC
  Administered 2015-12-29: 600 mg via ORAL
  Filled 2015-12-29: qty 1

## 2015-12-29 MED ORDER — SODIUM CHLORIDE 0.9 % IV BOLUS (SEPSIS)
1000.0000 mL | Freq: Once | INTRAVENOUS | Status: AC
  Administered 2015-12-29: 1000 mL via INTRAVENOUS

## 2015-12-29 MED ORDER — SODIUM CHLORIDE 0.9% FLUSH
3.0000 mL | Freq: Two times a day (BID) | INTRAVENOUS | Status: DC
  Administered 2015-12-30 – 2016-01-02 (×3): 3 mL via INTRAVENOUS

## 2015-12-29 MED ORDER — POLYETHYLENE GLYCOL 3350 17 G PO PACK: 17.0000 g | PACK | Freq: Every day | ORAL | Status: DC | PRN

## 2015-12-29 MED ORDER — ONDANSETRON HCL 4 MG PO TABS: 4.0000 mg | ORAL_TABLET | Freq: Four times a day (QID) | ORAL | Status: DC | PRN

## 2015-12-29 MED ORDER — DEXTROSE 5 % IV SOLN
1.0000 g | Freq: Once | INTRAVENOUS | Status: AC
  Administered 2015-12-29: 1 g via INTRAVENOUS
  Filled 2015-12-29: qty 10

## 2015-12-29 MED ORDER — TORSEMIDE 20 MG PO TABS
80.0000 mg | ORAL_TABLET | Freq: Every day | ORAL | Status: DC
  Administered 2015-12-30 – 2015-12-31 (×2): 80 mg via ORAL
  Filled 2015-12-29 (×3): qty 4

## 2015-12-29 MED ORDER — HYDRALAZINE HCL 25 MG PO TABS
25.0000 mg | ORAL_TABLET | Freq: Three times a day (TID) | ORAL | Status: DC
  Administered 2015-12-29 – 2016-01-03 (×15): 25 mg via ORAL
  Filled 2015-12-29 (×12): qty 1

## 2015-12-29 MED ORDER — ASPIRIN 325 MG PO TABS
325.0000 mg | ORAL_TABLET | Freq: Every day | ORAL | Status: DC
  Administered 2015-12-29 – 2016-01-03 (×6): 325 mg via ORAL
  Filled 2015-12-29 (×6): qty 1

## 2015-12-29 MED ORDER — DEXTROSE 5 % IV SOLN
500.0000 mg | Freq: Once | INTRAVENOUS | Status: AC
  Administered 2015-12-29: 500 mg via INTRAVENOUS
  Filled 2015-12-29: qty 500

## 2015-12-29 MED ORDER — ONDANSETRON HCL 4 MG/2ML IJ SOLN
4.0000 mg | Freq: Four times a day (QID) | INTRAMUSCULAR | Status: DC | PRN
  Administered 2015-12-30: 4 mg via INTRAVENOUS
  Filled 2015-12-29: qty 2

## 2015-12-29 MED ORDER — CARVEDILOL 6.25 MG PO TABS
6.2500 mg | ORAL_TABLET | Freq: Two times a day (BID) | ORAL | Status: DC
  Administered 2015-12-29 – 2016-01-03 (×12): 6.25 mg via ORAL
  Filled 2015-12-29 (×12): qty 1

## 2015-12-29 MED ORDER — ACETAMINOPHEN 325 MG PO TABS
650.0000 mg | ORAL_TABLET | Freq: Four times a day (QID) | ORAL | Status: DC | PRN
  Administered 2015-12-30: 650 mg via ORAL
  Filled 2015-12-29: qty 2

## 2015-12-29 MED ORDER — DEXTROSE 5 % IV SOLN
1.0000 g | INTRAVENOUS | Status: DC
  Administered 2015-12-30 – 2016-01-03 (×5): 1 g via INTRAVENOUS
  Filled 2015-12-29 (×6): qty 10

## 2015-12-29 MED ORDER — HYDROCODONE-ACETAMINOPHEN 5-325 MG PO TABS
1.0000 | ORAL_TABLET | Freq: Four times a day (QID) | ORAL | Status: DC | PRN
  Administered 2015-12-29 – 2015-12-31 (×4): 1 via ORAL
  Filled 2015-12-29 (×4): qty 1

## 2015-12-29 MED ORDER — TORSEMIDE 20 MG PO TABS
80.0000 mg | ORAL_TABLET | Freq: Once | ORAL | Status: AC
  Administered 2015-12-29: 80 mg via ORAL

## 2015-12-29 MED ORDER — ISOSORBIDE MONONITRATE ER 30 MG PO TB24
30.0000 mg | ORAL_TABLET | Freq: Two times a day (BID) | ORAL | Status: DC
  Administered 2015-12-29 – 2016-01-03 (×11): 30 mg via ORAL
  Filled 2015-12-29 (×11): qty 1

## 2015-12-29 MED ORDER — FLUTICASONE PROPIONATE 50 MCG/ACT NA SUSP
2.0000 | Freq: Every day | NASAL | Status: DC
  Administered 2015-12-30 – 2016-01-02 (×3): 2 via NASAL
  Filled 2015-12-29 (×2): qty 16

## 2015-12-29 MED ORDER — DEXTROSE 5 % IV SOLN
500.0000 mg | INTRAVENOUS | Status: DC
  Administered 2015-12-30: 500 mg via INTRAVENOUS
  Filled 2015-12-29: qty 500

## 2015-12-29 NOTE — ED Notes (Signed)
Attempted report 

## 2015-12-29 NOTE — ED Notes (Signed)
Pt placed on 2L [place] in response to sats of 87%. Sats improved to 97%.

## 2015-12-29 NOTE — ED Notes (Signed)
[facility] contacted to call Code Sepsis

## 2015-12-29 NOTE — ED Triage Notes (Signed)
Pt here with increased leg swelling and generalized weakness since having a "heart test" on the 8th. Pt reports being unable to "move around" as much as usual at home. Pt also reports decreased appetite and no food intake in 2 days. Pt a/o x 4.

## 2015-12-29 NOTE — Progress Notes (Signed)
Pharmacy Antibiotic Note  Gedeon Brandow is a 75 y.o. male admitted on 12/29/2015 with pneumonia.  Pharmacy has been consulted for Ceftriaxone and Azithromycin dosing.  One time doses ordered in the ED.  Code Sepsis called at 11:30.   Plan: Continue Ceftriaxone 1 g IV every 24 hours.  Continue Azithromycin 500 mg IV every 24 hours.   Height: '6\' 3"'$  (190.5 cm) Weight: 200 lb (90.7 kg) IBW/kg (Calculated) : 84.5  Temp (24hrs), Avg:100 F (37.8 C), Min:100 F (37.8 C), Max:100 F (37.8 C)   Recent Labs Lab 12/26/15 1036  WBC 18.1 Repeated and verified X2.*    CrCl cannot be calculated (Patient's most recent lab result is older than the maximum 21 days allowed.).    No Known Allergies  Antimicrobials this admission: Ceftriaxone 8/13 >> Azithromycin 8/13 >>  Microbiology results: 8/13 BCx:  8/13 UCx:   Thank you for allowing pharmacy to be a part of this patient's care.  Sloan Leiter, PharmD, BCPS Clinical Pharmacist [phone] 12/29/2015 11:34 AM

## 2015-12-29 NOTE — H&P (Signed)
History and Physical    Juan [name] EQA:[id number] DOB: 08-Nov-1940 DOA: 12/29/2015  PCP: [name], MD  Patient coming from:   Home   Chief Complaint: weakness  HPI: Juan [name] is a 75 y.o. male with a medical history significant for, but not  limited to, diastolic heart failure, OSA, hypertension and chronic kidney disease. Patient had an echocardiogram on the eighth of this month and since that time has gotten progressively weaker. Patient was able to walk without his cane or walker up until a few days ago now he is unable to walk with either . He complains of "cold sweats"  He has a poor appetite and complains of progressive lower extremity swelling. Patient avoids salt in his diet. He is compliant with home medications. He complains of mild dysuria.  ED Course:  Temp 100 , vital signs otherwise unremarkable  POC troponin 0.1, normal lactic acid. CXR - ? Mild edema EKG- new T wave inversions  Review of Systems: As per HPI, otherwise 10 point review of systems negative.    Past Medical History:  Diagnosis Date  . Allergic rhinitis   . Anemia   . Ascending aortic aneurysm ([place]) 03/2014   4.3cm on CT scan  . CAD (coronary artery disease)    dx elsewheer in past, no documentation. Non-ischemic myovue 2007  . CHF (congestive heart failure) (HCC)    dx elsewhere, no documentation. Normal EF by previous echo. Trival AS needs f/u ECHO by 2012  . Edema    R>L leg, u/s 5-12 neg for DVT  . Hemorrhoid   . History of thrombocytosis   . Hypertension   . Migraine    "once/wk at least" (07/11/2013)  . Myeloproliferative disease ([place])   . Sinus congestion   . Sleep apnea, obstructive    at some point used CPAP, was d/c  years ago  . Type II diabetes mellitus ([place])     Past Surgical History:  Procedure Laterality Date  . TOE SURGERY Right    "tried to straighten out big toe" (07/11/2013)    Social History   Social History  . Marital status: Widowed    Spouse name: N/A  . Number of  children: 2  . Years of education: N/A   Occupational History  . retired    .  Retired   Social History Main Topics  . Smoking status: Former Smoker    Packs/day: 0.25    Years: 12.00    Types: Cigarettes    Quit date: 05/18/1966  . Smokeless tobacco: Never Used     Comment: quit smoking 45 years ago  . Alcohol use No  . Drug use: No  . Sexual activity: Yes   Other Topics Concern  . Not on file   Social History Narrative   Moved from [place] 2006   Widow 2007   Son lives with him     No Known Allergies  Family History  Problem Relation Age of Onset  . Schizophrenia Son   . Colon cancer Neg Hx   . Prostate cancer Neg Hx   . Heart attack Neg Hx   . Diabetes Neg Hx     Prior to Admission medications   Medication Sig Start Date End Date Taking? Authorizing Provider  acetaminophen (TYLENOL) 500 MG tablet Take 500 mg by mouth every 6 (six) hours as needed for moderate pain or headache. Reported on 05/02/2015   Yes Historical Provider, MD  aspirin 325 MG tablet Take 1 tablet (325  mg total) by mouth daily. 04/16/14  Yes Ripudeep Krystal Eaton, MD  carvedilol (COREG) 6.25 MG tablet Take 1 tablet (6.25 mg total) by mouth 2 (two) times daily. 12/03/15  Yes Shirley Friar, PA-C  feeding supplement, GLUCERNA SHAKE, (GLUCERNA SHAKE) LIQD Take 237 mLs by mouth as needed (protein). Reported on 08/21/2015   Yes Historical Provider, MD  ferrous gluconate (FERGON) 225 (27 Fe) MG tablet Take 240 mg by mouth daily at 10 pm.   Yes Historical Provider, MD  fluticasone (FLONASE) 50 MCG/ACT nasal spray Place 2 sprays into both nostrils daily. Reported on 05/02/2015   Yes Historical Provider, MD  gabapentin (NEURONTIN) 100 MG capsule Take 100 mg by mouth 3 (three) times daily as needed (pain). Reported on 08/22/2015   Yes Historical Provider, MD  hydrALAZINE (APRESOLINE) 25 MG tablet TAKE 2 TABLETS BY MOUTH 3 TIMES DAILY. 12/03/15  Yes Amy D Clegg, NP  hydrocerin (EUCERIN) CREA Apply Eucerin cream to BLE Q  day after bathing and roughly towel drying to remove loose skin 01/27/14  Yes Shanker Kristeen Mans, MD  HYDROcodone-acetaminophen (NORCO/VICODIN) 5-325 MG per tablet Take 1 tablet by mouth every 6 (six) hours as needed for moderate pain. 10/18/14  Yes Colon Branch, MD  isosorbide mononitrate (IMDUR) 30 MG 24 hr tablet Take 1 tablet (30 mg total) by mouth 2 (two) times daily. 10/31/15  Yes Amy D Clegg, NP  simethicone (MYLICON) 80 MG chewable tablet Chew 80 mg by mouth every 6 (six) hours as needed for flatulence.   Yes Historical Provider, MD  torsemide (DEMADEX) 20 MG tablet Take 4 tablets (80 mg total) by mouth daily. 03/14/15  Yes Jolaine Artist, MD  vitamin B-12 (CYANOCOBALAMIN) 100 MCG tablet Take 100 mcg by mouth daily.   Yes Historical Provider, MD    Physical Exam: Vitals:   12/29/15 1330 12/29/15 1340 12/29/15 1345 12/29/15 1400  BP: 136/89  123/73 136/63  Pulse: 67  87 82  Resp: '17  17 18  '$ Temp:  99.1 F (37.3 C)    TempSrc:  Rectal    SpO2: 97%  98% 95%  Weight:      Height:        Constitutional:  Pleasant, thin, black male in NAD, calm, comfortable Vitals:   12/29/15 1330 12/29/15 1340 12/29/15 1345 12/29/15 1400  BP: 136/89  123/73 136/63  Pulse: 67  87 82  Resp: '17  17 18  '$ Temp:  99.1 F (37.3 C)    TempSrc:  Rectal    SpO2: 97%  98% 95%  Weight:      Height:       Eyes: PER, lids and conjunctivae normal ENMT: Mucous membranes are moist. Posterior pharynx clear of any exudate or lesions..  Neck: normal, supple, no masses Respiratory: Bibasilar crackles , no wheezing. Normal respiratory effort. No accessory muscle use.  Cardiovascular: Regular rate and rhythm, no murmurs / rubs / gallops. 3-4+ BLE edema. 2+ dorsal pedis pulses.   Abdomen: no tenderness, no masses palpated. No hepatomegaly. Bowel sounds positive.  Musculoskeletal: no clubbing / cyanosis. No joint deformity upper and lower extremities. Good ROM, no contractures. Normal muscle tone.  Skin: no rashes,  lesions, ulcers.  Neurologic: CN 2-12 grossly intact. Sensation intact, Strength 3//5 in all 4.  Psychiatric: Normal judgment and insight. Alert and oriented x 3. Normal mood.   Labs on Admission: I have personally reviewed following labs and imaging studies   Urine analysis:    Component Value Date/Time  COLORURINE YELLOW 12/29/2015 1135   APPEARANCEUR TURBID (A) 12/29/2015 1135   LABSPEC 1.011 12/29/2015 1135   PHURINE 5.5 12/29/2015 1135   GLUCOSEU NEGATIVE 12/29/2015 1135   GLUCOSEU NEGATIVE 05/02/2015 1008   HGBUR NEGATIVE 12/29/2015 1135   BILIRUBINUR NEGATIVE 12/29/2015 1135   KETONESUR NEGATIVE 12/29/2015 1135   PROTEINUR 100 (A) 12/29/2015 1135   UROBILINOGEN 0.2 05/02/2015 1008   NITRITE NEGATIVE 12/29/2015 1135   LEUKOCYTESUR LARGE (A) 12/29/2015 1135    Radiological Exams on Admission: Dg Chest Port 1 View  Result Date: 12/29/2015 CLINICAL DATA:  Cough and fever EXAM: PORTABLE CHEST 1 VIEW COMPARISON:  01/21/2015 chest radiograph. FINDINGS: Low lung volumes. Stable cardiomediastinal silhouette with mild cardiomegaly. No pneumothorax. No pleural effusion. Hazy opacities are present throughout the parahilar and basilar lungs bilaterally, not definitely changed. Stable diffuse osteosclerosis. IMPRESSION: Low lung volumes. No appreciable change in hazy parahilar and basilar lung opacities bilaterally, favor mild pulmonary edema given the mild cardiomegaly. Stable diffuse osteosclerosis consistent with history of myeloproliferative disease. Electronically Signed   By: Ilona Sorrel M.D.   On: 12/29/2015 12:12    EKG: Independently reviewed.   EKG Interpretation  Date/Time:  Sunday December 29 2015 11:01:18 EDT Ventricular Rate:  63 PR Interval:    QRS Duration: 108 QT Interval:  395 QTC Calculation: 405 R Axis:   -11 Text Interpretation:  Sinus rhythm Anteroseptal infarct, age indeterminate Baseline wander in lead(s) I III aVL T-wave inversions new from prior Confirmed  by Zenia Resides  MD, ANTHONY ([id number]) on 12/29/2015 1:44:00 PM      Assessment/Plan   Active Problems:   Myeloproliferative disease ([place])   Essential hypertension   Venous stasis dermatitis of both lower extremities   Weakness generalized   Peripheral edema   Chronic systolic heart failure (HCC)   UTI (lower urinary tract infection)   T wave inversion in EKG   UTI with weakness / diminished appetite -place in Obs-medical -send urine for culture -Rocephin -am CBC  Chronic systolic heart failure. Echocardiogram 4 days ago revealed EF 73%, grade 1 diastolic dysfunction.. Followed by the Heart Failure clinic. Poss edema on CXR today. .Outpatient Cardiology notes suggests medication compliance problems..  -continue coreg, imdur and hydralzine -BNP -Daily weights -Monitor intake and output -repeat trop x 1 -fluid restriction less than 1818m per day  -took home dose of '80mg'$  Demadex, will give an additional '80mg'$  now and resume home dose in am with close monitoring of renal function  New Twave inversions noted on EKG, reviewed with JJoyice Faster MD. In absence of symptoms not overly concerned about any ischemic events.  -monitor on tele -check trop now.  Peripheral edema, ?chronic.Likely combination of CHF AND venous stasis.  AKI superimposed on CKD, stage 3. Creatinine 1.82, up from baseline of 1.69.  -Needs diuresis but otherwise avoid other nephrotoxic medications -am bmet  DM2, diet controlled. Hemoglobin A1c 2 days ago was 6.1 -CBGs before meals  Chronic anemia, known myeloproliferative disorder followed by Oncology. Hemoglobin stable at 8.5. Patient is on chronic iron  OSA Couldn't tolerate cpap. .Marland Kitchen                              DVT prophylaxis:   Lovenox  Code Status:     Full code  Family Communication:    none Disposition Plan:   Discharge  Home in 24-48 hours  Consults called:  None   Admission status:   Observation - tlelemetry   Tye Savoy NP [facility] Pager [phone]  If 7PM-7AM, please contact night-coverage [email or web address] Password [email or web address]  12/29/2015, 2:25 PM

## 2015-12-29 NOTE — ED Provider Notes (Signed)
Medical screening examination/treatment/procedure(s) were conducted as a shared visit with non-physician practitioner(s) and myself.  I personally evaluated the patient during the encounter.   EKG Interpretation  Date/Time:  'Sunday December 29 2015 11:01:18 EDT Ventricular Rate:  63 PR Interval:    QRS Duration: 108 QT Interval:  395 QTC Calculation: 405 R Axis:   -11 Text Interpretation:  Sinus rhythm Anteroseptal infarct, age indeterminate Baseline wander in lead(s) I III aVL T-wave inversions new from prior Confirmed by Flavio Lindroth  MD, Christen Bedoya ([id number]) on 12/29/2015 1:44:00 PM     75'$  year old male presents with increased weakness after having an echocardiogram several days ago. Today's urinalysis shows gross infection. He was also febrile. Started on IV antibiotics and will be admitted to the medicine service   Lacretia Leigh, MD 12/29/15 1347

## 2015-12-29 NOTE — ED Provider Notes (Signed)
[facility] DEPT Provider Note   CSN: [id number] Arrival date & time: 12/29/15  1053  First Provider Contact:  First MD Initiated Contact with Patient 12/29/15 1113        History   Chief Complaint Chief Complaint  Patient presents with  . Weakness  . Leg Swelling    HPI Jaylynn Siefert is a 75 y.o. male.  Zoe Nordin is a 75 y.o. male who presents to the emergency department complaining of generalized body aches, generalized fatigue and weakness, and coughing for the past 5 days. The patient had an echocardiogram done 5 days ago he reports since he's been not feeling himself. The echo showed normal ejection fraction. He also reports decreased appetite and some pain with urination.  He also reports an episode of diarrhea yesterday. He complains of generalized pain that is worse around his back. He reports he's been having a productive cough. No hemoptysis. No known fevers. No treatments prior to arrival today. No recent admissions. Patient denies headache, changes to his vision, numbness, tingling, focal weakness, abdominal pain, nausea, vomiting, or rashes.   The history is provided by the patient and medical records. No language interpreter was used.    Past Medical History:  Diagnosis Date  . Allergic rhinitis   . Anemia   . Ascending aortic aneurysm ([place]) 03/2014   4.3cm on CT scan  . CAD (coronary artery disease)    dx elsewheer in past, no documentation. Non-ischemic myovue 2007  . CHF (congestive heart failure) (HCC)    dx elsewhere, no documentation. Normal EF by previous echo. Trival AS needs f/u ECHO by 2012  . Edema    R>L leg, u/s 5-12 neg for DVT  . Hemorrhoid   . History of thrombocytosis   . Hypertension   . Migraine    "once/wk at least" (07/11/2013)  . Myeloproliferative disease ([place])   . Shortness of breath   . Sinus congestion   . Sleep apnea, obstructive    at some point used CPAP, was d/c  years ago  . Type II diabetes mellitus [facility])     Patient  Active Problem List   Diagnosis Date Noted  . Sepsis ([place]) 12/29/2015  . UTI (lower urinary tract infection) 12/29/2015  . PCP NOTES >>>>>> 05/02/2015  . Chronic systolic heart failure ([place]) 02/14/2015  . CKD (chronic kidney disease), stage III 01/22/2015  . Pain in limb   . Peripheral edema   . Unable to ambulate   . Thoracic aortic aneurysm ([place]) 04/26/2014  . CKD (chronic kidney disease) stage 3, GFR 30-59 ml/min 04/26/2014  . 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 Chest pain 04/15/2014  . Palliative care encounter 04/06/2014  . Weakness generalized 04/06/2014  . Venous stasis dermatitis of both lower extremities   . Malnutrition of moderate degree ([place]) 03/31/2014  . Morbid obesity due to excess calories ([place]) 03/29/2014  . 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 Leukocytosis 10/17/2013  . Poor compliance with advise  05/05/2012  . Annual physical exam 01/14/2012  . Weight loss 01/14/2012  . BPH (benign prostatic hyperplasia) 01/14/2012  . *Chronic lower extremity edema, stasis dermatitis 06/09/2011  . CARDIOMEGALY 04/29/2009  . *Prediabetes 09/07/2006  . Symptomatic anemia 09/07/2006  . Myeloproliferative disease ([place]) 09/07/2006  . Obstructive sleep apnea 09/07/2006  . Essential hypertension 09/07/2006  . Coronary atherosclerosis 09/07/2006  . *CHF-- PCP comments 09/07/2006  . ALLERGIC RHINITIS 09/07/2006    Past Surgical History:  Procedure Laterality Date  . TOE SURGERY  Right    "tried to straighten out big toe" (07/11/2013)       Home Medications    Prior to Admission medications   Medication Sig Start Date End Date Taking? Authorizing Provider  acetaminophen (TYLENOL) 500 MG tablet Take 500 mg by mouth every 6 (six) hours as needed for moderate pain or headache. Reported on 05/02/2015   Yes Historical Provider, MD  aspirin 325 MG tablet Take 1 tablet (325 mg total) by mouth daily. 04/16/14  Yes Ripudeep Krystal Eaton, MD  carvedilol (COREG)  6.25 MG tablet Take 1 tablet (6.25 mg total) by mouth 2 (two) times daily. 12/03/15  Yes Shirley Friar, PA-C  feeding supplement, GLUCERNA SHAKE, (GLUCERNA SHAKE) LIQD Take 237 mLs by mouth as needed (protein). Reported on 08/21/2015   Yes Historical Provider, MD  ferrous gluconate (FERGON) 225 (27 Fe) MG tablet Take 240 mg by mouth daily at 10 pm.   Yes Historical Provider, MD  fluticasone (FLONASE) 50 MCG/ACT nasal spray Place 2 sprays into both nostrils daily. Reported on 05/02/2015   Yes Historical Provider, MD  gabapentin (NEURONTIN) 100 MG capsule Take 100 mg by mouth 3 (three) times daily as needed (pain). Reported on 08/22/2015   Yes Historical Provider, MD  hydrALAZINE (APRESOLINE) 25 MG tablet TAKE 2 TABLETS BY MOUTH 3 TIMES DAILY. 12/03/15  Yes Amy D Clegg, NP  hydrocerin (EUCERIN) CREA Apply Eucerin cream to BLE Q day after bathing and roughly towel drying to remove loose skin 01/27/14  Yes Shanker Kristeen Mans, MD  HYDROcodone-acetaminophen (NORCO/VICODIN) 5-325 MG per tablet Take 1 tablet by mouth every 6 (six) hours as needed for moderate pain. 10/18/14  Yes Colon Branch, MD  isosorbide mononitrate (IMDUR) 30 MG 24 hr tablet Take 1 tablet (30 mg total) by mouth 2 (two) times daily. 10/31/15  Yes Amy D Clegg, NP  simethicone (MYLICON) 80 MG chewable tablet Chew 80 mg by mouth every 6 (six) hours as needed for flatulence.   Yes Historical Provider, MD  torsemide (DEMADEX) 20 MG tablet Take 4 tablets (80 mg total) by mouth daily. 03/14/15  Yes Jolaine Artist, MD  vitamin B-12 (CYANOCOBALAMIN) 100 MCG tablet Take 100 mcg by mouth daily.   Yes Historical Provider, MD    Family History Family History  Problem Relation Age of Onset  . Schizophrenia Son   . Colon cancer Neg Hx   . Prostate cancer Neg Hx   . Heart attack Neg Hx   . Diabetes Neg Hx     Social History Social History  Substance Use Topics  . Smoking status: Former Smoker    Packs/day: 0.25    Years: 12.00    Types:  Cigarettes    Quit date: 05/18/1966  . Smokeless tobacco: Never Used     Comment: quit smoking 45 years ago  . Alcohol use No     Allergies   Review of patient's allergies indicates no known allergies.   Review of Systems Review of Systems  Constitutional: Positive for appetite change, chills and fatigue. Negative for fever.  HENT: Negative for congestion and sore throat.   Eyes: Negative for visual disturbance.  Respiratory: Positive for cough. Negative for shortness of breath and wheezing.   Cardiovascular: Positive for leg swelling (chronic, unchanged ). Negative for chest pain and palpitations.  Gastrointestinal: Positive for diarrhea. Negative for abdominal pain, blood in stool, nausea and vomiting.  Genitourinary: Positive for dysuria. Negative for decreased urine volume, difficulty urinating, flank pain, frequency, hematuria,  penile pain, testicular pain and urgency.  Musculoskeletal: Positive for myalgias. Negative for back pain and neck pain.  Skin: Negative for rash.  Neurological: Negative for headaches.     Physical Exam Updated Vital Signs BP 136/63   Pulse 82   Temp 99.1 F (37.3 C) (Rectal)   Resp 18   Ht '6\' 3"'$  (1.905 m)   Wt 90.7 kg   SpO2 95%   BMI 25.00 kg/m   Physical Exam  Constitutional: He appears well-developed and well-nourished. No distress.  Nontoxic appearing.  HENT:  Head: Normocephalic and atraumatic.  Mouth/Throat: Oropharynx is clear and moist. No oropharyngeal exudate.  Eyes: Conjunctivae are normal. Pupils are equal, round, and reactive to light. Right eye exhibits no discharge. Left eye exhibits no discharge.  Neck: Neck supple. No JVD present. No tracheal deviation present.  Cardiovascular: Normal rate, regular rhythm, normal heart sounds and intact distal pulses.  Exam reveals no gallop and no friction rub.   No murmur heard. Heart rate is 88. Good capillary refill.  Pulmonary/Chest: Effort normal. No stridor. No respiratory  distress. He has no wheezes.  No respiratory distress. Patient has slightly diminished lung sounds to his bilateral bases. No increased work of breathing.  Abdominal: Soft. He exhibits no distension. There is no tenderness. There is no guarding.  Abdomen is soft and nontender to palpation.  Genitourinary:  Genitourinary Comments: No GU rashes noted.  Musculoskeletal: He exhibits no edema.  Lymphadenopathy:    He has no cervical adenopathy.  Neurological: He is alert. Coordination normal.  Skin: Skin is warm and dry. Capillary refill takes less than 2 seconds. No rash noted. He is not diaphoretic. No erythema. No pallor.  Psychiatric: He has a normal mood and affect. His behavior is normal.  Nursing note and vitals reviewed.    ED Treatments / Results  Labs (all labs ordered are listed, but only abnormal results are displayed) Labs Reviewed  COMPREHENSIVE METABOLIC PANEL - Abnormal; Notable for the following:       Result Value   Sodium 134 (*)    Glucose, Bld 118 (*)    BUN 71 (*)    Creatinine, Ser 1.82 (*)    Albumin 3.3 (*)    ALT 13 (*)    GFR calc non Af Amer 35 (*)    GFR calc Af Amer 40 (*)    All other components within normal limits  CBC WITH DIFFERENTIAL/PLATELET - Abnormal; Notable for the following:    WBC 17.5 (*)    RBC 3.38 (*)    Hemoglobin 8.5 (*)    HCT 27.4 (*)    MCH 25.1 (*)    RDW 19.3 (*)    Platelets 466 (*)    Neutro Abs 10.0 (*)    Lymphs Abs 4.4 (*)    Monocytes Absolute 1.1 (*)    Basophils Absolute 1.6 (*)    All other components within normal limits  URINALYSIS, ROUTINE W REFLEX MICROSCOPIC (NOT AT [facility]) - Abnormal; Notable for the following:    APPearance TURBID (*)    Protein, ur 100 (*)    Leukocytes, UA LARGE (*)    All other components within normal limits  URINE MICROSCOPIC-ADD ON - Abnormal; Notable for the following:    Bacteria, UA MANY (*)    All other components within normal limits  CULTURE, BLOOD (ROUTINE X 2)  CULTURE,  BLOOD (ROUTINE X 2)  URINE CULTURE  I-STAT CG4 LACTIC ACID, ED  Randolm Idol, ED  I-STAT CG4 LACTIC ACID, ED    EKG  EKG Interpretation  Date/Time:  Sunday December 29 2015 11:01:18 EDT Ventricular Rate:  63 PR Interval:    QRS Duration: 108 QT Interval:  395 QTC Calculation: 405 R Axis:   -11 Text Interpretation:  Sinus rhythm Anteroseptal infarct, age indeterminate Baseline wander in lead(s) I III aVL T-wave inversions new from prior Confirmed by Zenia Resides  MD, ANTHONY ([id number]) on 12/29/2015 1:44:00 PM       Radiology Dg Chest Port 1 View  Result Date: 12/29/2015 CLINICAL DATA:  Cough and fever EXAM: PORTABLE CHEST 1 VIEW COMPARISON:  01/21/2015 chest radiograph. FINDINGS: Low lung volumes. Stable cardiomediastinal silhouette with mild cardiomegaly. No pneumothorax. No pleural effusion. Hazy opacities are present throughout the parahilar and basilar lungs bilaterally, not definitely changed. Stable diffuse osteosclerosis. IMPRESSION: Low lung volumes. No appreciable change in hazy parahilar and basilar lung opacities bilaterally, favor mild pulmonary edema given the mild cardiomegaly. Stable diffuse osteosclerosis consistent with history of myeloproliferative disease. Electronically Signed   By: Ilona Sorrel M.D.   On: 12/29/2015 12:12    Procedures Procedures (including critical care time)  Medications Ordered in ED Medications  cefTRIAXone (ROCEPHIN) 1 g in dextrose 5 % 50 mL IVPB (not administered)  azithromycin (ZITHROMAX) 500 mg in dextrose 5 % 250 mL IVPB (not administered)  cefTRIAXone (ROCEPHIN) 1 g in dextrose 5 % 50 mL IVPB (1 g Intravenous New Bag/Given 12/29/15 1406)  carvedilol (COREG) tablet 6.25 mg (not administered)  hydrALAZINE (APRESOLINE) tablet 25 mg (not administered)  isosorbide mononitrate (IMDUR) 24 hr tablet 30 mg (not administered)  feeding supplement (GLUCERNA SHAKE) (GLUCERNA SHAKE) liquid 237 mL (not administered)  gabapentin (NEURONTIN) capsule 100  mg (not administered)  aspirin tablet 325 mg (not administered)  HYDROcodone-acetaminophen (NORCO/VICODIN) 5-325 MG per tablet 1 tablet (not administered)  fluticasone (FLONASE) 50 MCG/ACT nasal spray 2 spray (not administered)  sodium chloride 0.9 % bolus 1,000 mL (0 mLs Intravenous Stopped 12/29/15 1238)  cefTRIAXone (ROCEPHIN) 1 g in dextrose 5 % 50 mL IVPB (0 g Intravenous Stopped 12/29/15 1213)  azithromycin (ZITHROMAX) 500 mg in dextrose 5 % 250 mL IVPB (0 mg Intravenous Stopped 12/29/15 1237)     Initial Impression / Assessment and Plan / ED Course  I have reviewed the triage vital signs and the nursing notes.  Pertinent labs & imaging results that were available during my care of the patient were reviewed by me and considered in my medical decision making (see chart for details).  Clinical Course  Comment By Time  Repeat sepsis assessment completed.  At reevaluation patient reports he is starting to feel better, but not great. He has good capillary refill and normal blood pressure. I discussed his test results and that we are treating him for UTI. Will bring into hospital. Patient agrees with plan.  Waynetta Pean, PA-C 08/13 1350   This is a 75 y.o. male who presents to the emergency department complaining of generalized body aches, generalized fatigue and weakness, and coughing for the past 5 days. The patient had an echocardiogram done 5 days ago he reports since he's been not feeling himself. The echo showed normal ejection fraction. He also reports decreased appetite and some pain with urination.  He also reports an episode of diarrhea yesterday. He complains of generalized pain that is worse around his back. He reports he's been having a productive cough. No hemoptysis. On arrival to the emergency department the patient has oral temperature of 100.0. He  is nontoxic appearing. Lung sounds are slightly diminished in his bilateral bases. No increased work of breathing. Abdomen is soft  and nontender. No CVA or flank tenderness. I activated a code sepsis on my assessment.  EKG shows some new T-wave inversions. Patient denies any chest pain. Troponin is not elevated. Lactic acid is normal. Urinalysis shows large leukocytes with too numerous to count white blood cells. Urine sent for culture. CMP reveals a creatinine slightly elevated from his baseline of 1.82. CBC shows leukocytosis with a white count of 17,000. Hemoglobin is 8.5 which is a slight improvement from his baseline of around 8.0. Chest x-ray shows low lung volumes and stable cardiac silhouette with mild cardiomegaly. No appreciable change in hazy parahilar and basilar lung opacities bilaterally. Possible mild pulmonary edema. No infiltrate noted. Patient with urinary tract infection. An additional gram of Rocephin was added to his antibiotics for a total of 2 g of Rocephin for UTI. Will admit for urinary tract infection. No history of UTIs. Initially I mistakenly placed this patient and to inpatient for sepsis. The patient did not meet sepsis criteria. Will place in observation for UTI.  This patient was discussed with and evaluated by Dr. Zenia Resides who agrees with assessment and plan.   Final Clinical Impressions(s) / ED Diagnoses   Final diagnoses:  UTI (lower urinary tract infection)    New Prescriptions New Prescriptions   No medications on file     Waynetta Pean, PA-C 12/29/15 1433

## 2015-12-30 ENCOUNTER — Telehealth: Payer: Self-pay | Admitting: *Deleted

## 2015-12-30 DIAGNOSIS — M109 Gout, unspecified: Secondary | ICD-10-CM | POA: Diagnosis present

## 2015-12-30 DIAGNOSIS — R609 Edema, unspecified: Secondary | ICD-10-CM | POA: Diagnosis not present

## 2015-12-30 DIAGNOSIS — I872 Venous insufficiency (chronic) (peripheral): Secondary | ICD-10-CM | POA: Diagnosis present

## 2015-12-30 DIAGNOSIS — D471 Chronic myeloproliferative disease: Secondary | ICD-10-CM | POA: Diagnosis not present

## 2015-12-30 DIAGNOSIS — E1122 Type 2 diabetes mellitus with diabetic chronic kidney disease: Secondary | ICD-10-CM | POA: Diagnosis not present

## 2015-12-30 DIAGNOSIS — I5043 Acute on chronic combined systolic (congestive) and diastolic (congestive) heart failure: Secondary | ICD-10-CM | POA: Diagnosis not present

## 2015-12-30 DIAGNOSIS — D72829 Elevated white blood cell count, unspecified: Secondary | ICD-10-CM | POA: Diagnosis not present

## 2015-12-30 DIAGNOSIS — Z9114 Patient's other noncompliance with medication regimen: Secondary | ICD-10-CM | POA: Diagnosis not present

## 2015-12-30 DIAGNOSIS — C946 Myelodysplastic disease, not classified: Secondary | ICD-10-CM | POA: Diagnosis present

## 2015-12-30 DIAGNOSIS — E875 Hyperkalemia: Secondary | ICD-10-CM | POA: Diagnosis present

## 2015-12-30 DIAGNOSIS — G4733 Obstructive sleep apnea (adult) (pediatric): Secondary | ICD-10-CM | POA: Diagnosis present

## 2015-12-30 DIAGNOSIS — L8992 Pressure ulcer of unspecified site, stage 2: Secondary | ICD-10-CM | POA: Diagnosis not present

## 2015-12-30 DIAGNOSIS — Z79899 Other long term (current) drug therapy: Secondary | ICD-10-CM | POA: Diagnosis not present

## 2015-12-30 DIAGNOSIS — N390 Urinary tract infection, site not specified: Secondary | ICD-10-CM | POA: Diagnosis not present

## 2015-12-30 DIAGNOSIS — N183 Chronic kidney disease, stage 3 (moderate): Secondary | ICD-10-CM | POA: Diagnosis not present

## 2015-12-30 DIAGNOSIS — L8990 Pressure ulcer of unspecified site, unspecified stage: Secondary | ICD-10-CM | POA: Diagnosis not present

## 2015-12-30 DIAGNOSIS — I2510 Atherosclerotic heart disease of native coronary artery without angina pectoris: Secondary | ICD-10-CM | POA: Diagnosis present

## 2015-12-30 DIAGNOSIS — I878 Other specified disorders of veins: Secondary | ICD-10-CM | POA: Diagnosis present

## 2015-12-30 DIAGNOSIS — I5022 Chronic systolic (congestive) heart failure: Secondary | ICD-10-CM | POA: Diagnosis not present

## 2015-12-30 DIAGNOSIS — Z7982 Long term (current) use of aspirin: Secondary | ICD-10-CM | POA: Diagnosis not present

## 2015-12-30 DIAGNOSIS — I10 Essential (primary) hypertension: Secondary | ICD-10-CM | POA: Diagnosis not present

## 2015-12-30 DIAGNOSIS — N179 Acute kidney failure, unspecified: Secondary | ICD-10-CM | POA: Diagnosis not present

## 2015-12-30 DIAGNOSIS — I130 Hypertensive heart and chronic kidney disease with heart failure and stage 1 through stage 4 chronic kidney disease, or unspecified chronic kidney disease: Secondary | ICD-10-CM | POA: Diagnosis present

## 2015-12-30 DIAGNOSIS — Z87891 Personal history of nicotine dependence: Secondary | ICD-10-CM | POA: Diagnosis not present

## 2015-12-30 DIAGNOSIS — R9431 Abnormal electrocardiogram [ECG] [EKG]: Secondary | ICD-10-CM | POA: Diagnosis not present

## 2015-12-30 LAB — BASIC METABOLIC PANEL
Anion gap: 8 (ref 5–15)
BUN: 64 mg/dL — AB (ref 6–20)
CALCIUM: 9.3 mg/dL (ref 8.9–10.3)
CO2: 23 mmol/L (ref 22–32)
CREATININE: 1.63 mg/dL — AB (ref 0.61–1.24)
Chloride: 107 mmol/L (ref 101–111)
GFR calc non Af Amer: 40 mL/min — ABNORMAL LOW (ref >60)
GFR, EST AFRICAN AMERICAN: 46 mL/min — AB (ref >60)
GLUCOSE: 109 mg/dL — AB (ref 65–99)
Potassium: 4.3 mmol/L (ref 3.5–5.1)
Sodium: 138 mmol/L (ref 135–145)

## 2015-12-30 LAB — GLUCOSE, CAPILLARY
GLUCOSE-CAPILLARY: 135 mg/dL — AB (ref 65–99)
Glucose-Capillary: 139 mg/dL — ABNORMAL HIGH (ref 65–99)
Glucose-Capillary: 94 mg/dL (ref 65–99)
Glucose-Capillary: 139 mg/dL — ABNORMAL HIGH (ref 65–99)

## 2015-12-30 LAB — CBC
HCT: 24.4 % — ABNORMAL LOW (ref 39.0–52.0)
Hemoglobin: 7.7 g/dL — ABNORMAL LOW (ref 13.0–17.0)
MCH: 25.2 pg — AB (ref 26.0–34.0)
MCHC: 31.6 g/dL (ref 30.0–36.0)
MCV: 79.7 fL (ref 78.0–100.0)
PLATELETS: 402 10*3/uL — AB (ref 150–400)
RBC: 3.06 MIL/uL — AB (ref 4.22–5.81)
RDW: 19.4 % — AB (ref 11.5–15.5)
WBC: 15.1 10*3/uL — ABNORMAL HIGH (ref 4.0–10.5)

## 2015-12-30 LAB — TROPONIN I: Troponin I: <0.03 ng/mL (ref <0.03)

## 2015-12-30 LAB — PATHOLOGIST SMEAR REVIEW

## 2015-12-30 MED ORDER — GUAIFENESIN-DM 100-10 MG/5ML PO SYRP
5.0000 mL | ORAL_SOLUTION | ORAL | Status: DC | PRN
  Administered 2015-12-30: 5 mL via ORAL
  Filled 2015-12-30: qty 5

## 2015-12-30 NOTE — Care Management CC44 (Signed)
Condition Code 44 Documentation Completed  Patient Details  Name: Juan [name] MRN: [id number] Date of Birth: 1940-12-27   Condition Code 44 given:  Yes Patient signature on Condition Code 44 notice:  Yes Documentation of 2 MD's agreement:  Yes Code 44 added to claim:  Yes    Ninfa Meeker, RN 12/30/2015, 12:20 PM

## 2015-12-30 NOTE — Telephone Encounter (Signed)
Received Physicians Orders/Plan of Care paperwork via mail from [place].  Billing sheet attached and placed in folder for Dr. Larose Kells to review and sign.//AB/CMA

## 2015-12-30 NOTE — Progress Notes (Signed)
PROGRESS NOTE  Juan [name] IRJ:[id number] DOB: [birth year] DOA: 12/29/2015 PCP: [name], MD  Brief Narrative: 75 year old man with diastolic heart failure presented with generalized weakness, increasing lower extremity edema, shortness of breath, dysuria. Admitted for UTI.  Assessment/Plan: 1. UTI, generalized weakness, improving. 2. Chronic diastolic heart failure. Mild lower extremity edema. 3. New T-wave inversions on EKG anteroseptal. No changes today on EKG. Troponins negative. Asymptomatic. No further evaluation suggested. 4. Acute kidney injury superimposed on chronic kidney disease stage III , improving. 5. Obstructive sleep apnea 6. Diabetes mellitus type 2, appears stable 7. Coronary artery disease 8. Myeloproliferative disorder, Stable hemoglobin and platelet count 9. Pressure Ulcer 12/29/15 Stage II    Continue empiric antibiotics. Follow-up culture data.  Continue oral diuretic  Follow clinically  DVT prophylaxis: Lovenox Code Status: full Family Communication: none Disposition Plan: home  Murray Hodgkins, MD  [facility] Direct contact: [phone] --[place] [place]  --[email or web address]; password [email or web address]  7PM-7AM contact night coverage as above 12/30/2015, 2:37 PM  LOS: 1 day   Consultants:    Procedures:    Antimicrobials:  Ceftriaxone 8/13 >>  HPI/Subjective: Feeling better.  Objective: Vitals:   12/29/15 1530 12/29/15 2134 12/30/15 0528 12/30/15 1306  BP: 129/68 116/64 (!) 101/55 (!) 97/56  Pulse: 85 83 97 79  Resp: 12   16  Temp:  98.7 F (37.1 C) 98.8 F (37.1 C) 97.8 F (36.6 C)  TempSrc:  Oral Oral Oral  SpO2: 92% 93% 90% 97%  Weight:   92.4 kg (203 lb 9.6 oz)   Height:        Intake/Output Summary (Last 24 hours) at 12/30/15 1437 Last data filed at 12/30/15 1114  Gross per 24 hour  Intake              100 ml  Output              250 ml  Net             -150 ml     Filed Weights   12/29/15 1102 12/30/15 0528  Weight:  90.7 kg (200 lb) 92.4 kg (203 lb 9.6 oz)    Exam:    Constitutional:  . Appears calm and comfortable Respiratory:  . CTA bilaterally, no w/r/r.  . Respiratory effort normal. No retractions or accessory muscle use Cardiovascular:  . RRR, no m/r/g . 2+ bilateral LE extremity edema   . Telemetry SR Psychiatric:  . judgement and insight appear normal . Mental status o Mood, affect appropriate I have personally reviewed following labs and imaging studies:  Blood sugars stable  BMP, BUN and creatinine improved.  Troponins negative.  Hemoglobin slightly lower, 7.7, appears to be a baseline  Scheduled Meds: . aspirin  325 mg Oral Daily  . azithromycin  500 mg Intravenous Q24H  . carvedilol  6.25 mg Oral BID  . cefTRIAXone (ROCEPHIN)  IV  1 g Intravenous Q24H  . enoxaparin (LOVENOX) injection  40 mg Subcutaneous Q24H  . fluticasone  2 spray Each Nare Daily  . hydrALAZINE  25 mg Oral Q8H  . isosorbide mononitrate  30 mg Oral BID  . sodium chloride flush  3 mL Intravenous Q12H  . torsemide  80 mg Oral Daily   Continuous Infusions:   Active Problems:   Myeloproliferative disease ([place])   Essential hypertension   Venous stasis dermatitis of both lower extremities   Weakness generalized   Peripheral edema   Chronic systolic heart failure (HCC)   UTI (  lower urinary tract infection)   T wave inversion in EKG   LOS: 1 day   Time spent 20 minutes

## 2015-12-30 NOTE — Care Management Obs Status (Signed)
[place] NOTIFICATION   Patient Details  Name: Juan [name] MRN: [id number] Date of Birth: 1940/06/14   Medicare Observation Status Notification Given:  Yes    Ninfa Meeker, RN 12/30/2015, 12:20 PM

## 2015-12-30 NOTE — Progress Notes (Signed)
   12/30/15 1000  Clinical Encounter Type  Visited With Patient;Family;Patient and family together  Visit Type Initial;Spiritual support  Referral From Patient;Nurse  Spiritual Encounters  Spiritual Needs Prayer;Emotional  [place] responded to a patient request for prayer; pt was sitting in chair and indicated that he was gaining some strength; Ch prayed with patient and with son [name] who arrived during visit.  [employer] will try to come back later in the week. Gwynn Burly 10:33 AM

## 2015-12-31 DIAGNOSIS — N183 Chronic kidney disease, stage 3 (moderate): Secondary | ICD-10-CM

## 2015-12-31 DIAGNOSIS — M10069 Idiopathic gout, unspecified knee: Secondary | ICD-10-CM

## 2015-12-31 DIAGNOSIS — N179 Acute kidney failure, unspecified: Secondary | ICD-10-CM

## 2015-12-31 DIAGNOSIS — I5033 Acute on chronic diastolic (congestive) heart failure: Secondary | ICD-10-CM

## 2015-12-31 DIAGNOSIS — R531 Weakness: Secondary | ICD-10-CM

## 2015-12-31 LAB — GLUCOSE, CAPILLARY
GLUCOSE-CAPILLARY: 161 mg/dL — AB (ref 65–99)
GLUCOSE-CAPILLARY: 121 mg/dL — AB (ref 65–99)
Glucose-Capillary: 122 mg/dL — ABNORMAL HIGH (ref 65–99)
Glucose-Capillary: 119 mg/dL — ABNORMAL HIGH (ref 65–99)

## 2015-12-31 LAB — URINE CULTURE

## 2015-12-31 MED ORDER — MUSCLE RUB 10-15 % EX CREA
1.0000 "application " | TOPICAL_CREAM | CUTANEOUS | Status: DC | PRN
  Administered 2015-12-31: 1 via TOPICAL
  Filled 2015-12-31: qty 85

## 2015-12-31 MED ORDER — SALINE SPRAY 0.65 % NA SOLN
1.0000 | NASAL | Status: DC | PRN
  Filled 2015-12-31: qty 44

## 2015-12-31 MED ORDER — PHENOL 1.4 % MT LIQD: 1.0000 | OROMUCOSAL | Status: DC | PRN

## 2015-12-31 MED ORDER — PREDNISONE 20 MG PO TABS
20.0000 mg | ORAL_TABLET | Freq: Every day | ORAL | Status: DC
  Administered 2016-01-01 – 2016-01-03 (×3): 20 mg via ORAL
  Filled 2015-12-31 (×3): qty 1

## 2015-12-31 MED ORDER — ALUM & MAG HYDROXIDE-SIMETH 200-200-20 MG/5ML PO SUSP
30.0000 mL | ORAL | Status: DC | PRN
  Administered 2015-12-31: 30 mL via ORAL
  Filled 2015-12-31 (×2): qty 30

## 2015-12-31 MED ORDER — LIP MEDEX EX OINT: 1.0000 "application " | TOPICAL_OINTMENT | CUTANEOUS | Status: DC | PRN

## 2015-12-31 MED ORDER — POLYVINYL ALCOHOL 1.4 % OP SOLN
2.0000 [drp] | OPHTHALMIC | Status: DC | PRN
  Filled 2015-12-31: qty 15

## 2015-12-31 MED ORDER — GUAIFENESIN-DM 100-10 MG/5ML PO SYRP: 5.0000 mL | ORAL_SOLUTION | ORAL | Status: DC | PRN

## 2015-12-31 MED ORDER — FUROSEMIDE 10 MG/ML IJ SOLN
40.0000 mg | Freq: Two times a day (BID) | INTRAMUSCULAR | Status: DC
  Administered 2015-12-31 – 2016-01-03 (×6): 40 mg via INTRAVENOUS
  Filled 2015-12-31 (×6): qty 4

## 2015-12-31 MED ORDER — BLISTEX MEDICATED EX OINT
TOPICAL_OINTMENT | CUTANEOUS | Status: DC | PRN
  Filled 2015-12-31: qty 6.3

## 2015-12-31 NOTE — Progress Notes (Signed)
PROGRESS NOTE  Juan [name] YOV:[id number] DOB: [birth year] DOA: 12/29/2015 PCP: [name], MD  Brief Narrative: 75 year old man with diastolic heart failure presented with generalized weakness, increasing lower extremity edema, shortness of breath, dysuria. Admitted for UTI.  Assessment/Plan: 1. UTI, generalized weakness: slowly improving. Will continue current antibiotics and will follow urine cultures. No fever and denies dysuria currently. 2. Acute Chronic diastolic heart failure. Will continue IV lasix, daily weights, low sodium diet and strict intake/output. Probably triggered by diet indiscretion, poor compliance with medications and UTI. 3. New T-wave inversions on EKG anteroseptal. No changes today on EKG. Troponins negative. Asymptomatic. No further evaluation suggested at this moment. 4. Acute kidney injury superimposed on chronic kidney disease stage III , improving. Will monitor renal function trend. Most likely associated with CHF exacerbation 5. Obstructive sleep apnea. 6. Diabetes mellitus type 2, appears stable. Will monitor CBG's as he will received steroids for gout flare. 7. Coronary artery disease: no CP. Will continue current medication regimen 8. Myeloproliferative disorder, Stable hemoglobin and platelet count. Will monitor trend. 9. Pressure Ulcer 12/29/15 Stage II; continue preventive measures and constant repositioning  10. Gout flare: probably from diuresis. Will start prednisone '20mg'$  X 7 days. PT consulted for assessment on mobility.  DVT prophylaxis: Lovenox Code Status: full Family Communication: none at bedside  Disposition Plan: to be determined; will ask PT to evaluate and provide rec's  Consultants:  None   Procedures:  See below for x-ray reports   Antimicrobials:  Ceftriaxone 8/13 >>  HPI/Subjective: Feeling tired; complaining of pain on his knees and still SOB.  Objective: Vitals:   12/30/15 2115 12/30/15 2242 12/31/15 0633 12/31/15 1433  BP:  125/63  113/61 (!) 136/59  Pulse: (!) 109  (!) 102 83  Resp: '17  18 19  '$ Temp: (!) 101.4 F (38.6 C) 99.8 F (37.7 C) 98.8 F (37.1 C) 99 F (37.2 C)  TempSrc: Oral Oral Oral Oral  SpO2: 92%  (!) 77% 96%  Weight:   92.7 kg (204 lb 4.8 oz)   Height:        Intake/Output Summary (Last 24 hours) at 12/31/15 1823 Last data filed at 12/31/15 1746  Gross per 24 hour  Intake              720 ml  Output             1125 ml  Net             -405 ml     Filed Weights   12/29/15 1102 12/30/15 0528 12/31/15 0633  Weight: 90.7 kg (200 lb) 92.4 kg (203 lb 9.6 oz) 92.7 kg (204 lb 4.8 oz)    Exam:    Constitutional:  . Appears in no major distress; now requiring oxygen due to SOB overnight. Denies CP. Complaining of being weak and with increase pain on his knees bilaterally. Respiratory:  . Decrease BS at bases; scattered rhonchi; no wheezing.  Marland Kitchen Respiratory effort normal. No retractions or accessory muscle use Cardiovascular:  . RRR, no m/r/g; positive JVD . 2+ bilateral LE extremity edema   . Telemetry SR Extremities :  With 2+ edema bilaterally  Knees swollen and tender to movement Psychiatric:  . judgement and insight appear normal . Mental status appropriate; stable mood; no SI or hallucinations  I have personally reviewed following labs and imaging studies:  Blood sugars stable  BMP, BUN and creatinine improved.  Troponins negative.  Hemoglobin slightly lower, 7.7, appears to be a baseline  Scheduled Meds: . aspirin  325 mg Oral Daily  . carvedilol  6.25 mg Oral BID  . cefTRIAXone (ROCEPHIN)  IV  1 g Intravenous Q24H  . enoxaparin (LOVENOX) injection  40 mg Subcutaneous Q24H  . fluticasone  2 spray Each Nare Daily  . furosemide  40 mg Intravenous Q12H  . hydrALAZINE  25 mg Oral Q8H  . isosorbide mononitrate  30 mg Oral BID  . predniSONE  20 mg Oral Q breakfast  . sodium chloride flush  3 mL Intravenous Q12H   Continuous Infusions:   Active Problems:    Myeloproliferative disease ([place])   Essential hypertension   Venous stasis dermatitis of both lower extremities   Weakness generalized   Peripheral edema   Chronic systolic heart failure (HCC)   UTI (lower urinary tract infection)   T wave inversion in EKG   UTI (urinary tract infection)   Pressure ulcer   [place]   LOS: 2 days   Time spent 20 minutes

## 2016-01-01 DIAGNOSIS — L8990 Pressure ulcer of unspecified site, unspecified stage: Secondary | ICD-10-CM

## 2016-01-01 DIAGNOSIS — I10 Essential (primary) hypertension: Secondary | ICD-10-CM

## 2016-01-01 DIAGNOSIS — D471 Chronic myeloproliferative disease: Secondary | ICD-10-CM

## 2016-01-01 DIAGNOSIS — R609 Edema, unspecified: Secondary | ICD-10-CM

## 2016-01-01 DIAGNOSIS — I5022 Chronic systolic (congestive) heart failure: Secondary | ICD-10-CM

## 2016-01-01 LAB — BASIC METABOLIC PANEL
ANION GAP: 8 (ref 5–15)
BUN: 63 mg/dL — ABNORMAL HIGH (ref 6–20)
CALCIUM: 9.3 mg/dL (ref 8.9–10.3)
CHLORIDE: 105 mmol/L (ref 101–111)
CO2: 23 mmol/L (ref 22–32)
Creatinine, Ser: 1.75 mg/dL — ABNORMAL HIGH (ref 0.61–1.24)
GFR calc non Af Amer: 37 mL/min — ABNORMAL LOW (ref >60)
GFR, EST AFRICAN AMERICAN: 42 mL/min — AB (ref >60)
Glucose, Bld: 117 mg/dL — ABNORMAL HIGH (ref 65–99)
Potassium: 5.2 mmol/L — ABNORMAL HIGH (ref 3.5–5.1)
SODIUM: 136 mmol/L (ref 135–145)

## 2016-01-01 LAB — CBC
HCT: 27.1 % — ABNORMAL LOW (ref 39.0–52.0)
HEMOGLOBIN: 8.3 g/dL — AB (ref 13.0–17.0)
MCH: 24.9 pg — AB (ref 26.0–34.0)
MCHC: 30.6 g/dL (ref 30.0–36.0)
MCV: 81.4 fL (ref 78.0–100.0)
PLATELETS: 537 10*3/uL — AB (ref 150–400)
RBC: 3.33 MIL/uL — ABNORMAL LOW (ref 4.22–5.81)
RDW: 19.3 % — AB (ref 11.5–15.5)
WBC: 18.9 10*3/uL — ABNORMAL HIGH (ref 4.0–10.5)

## 2016-01-01 LAB — GLUCOSE, CAPILLARY
GLUCOSE-CAPILLARY: 114 mg/dL — AB (ref 65–99)
GLUCOSE-CAPILLARY: 133 mg/dL — AB (ref 65–99)
GLUCOSE-CAPILLARY: 158 mg/dL — AB (ref 65–99)
GLUCOSE-CAPILLARY: 147 mg/dL — AB (ref 65–99)

## 2016-01-01 NOTE — Progress Notes (Signed)
PROGRESS NOTE  Konner Saiz ZTI:[id number] DOB: 1940/06/09 DOA: 12/29/2015 PCP: [name], MD  Brief Narrative: 75 year old man with diastolic heart failure presented with generalized weakness, increasing lower extremity edema, shortness of breath, dysuria. Admitted for UTI.   HPI/Subjective: Feeling tired; complaining of pain on his knees and still SOB.  Assessment/Plan:  Active Problems:   Myeloproliferative disease ([place])   Essential hypertension   Venous stasis dermatitis of both lower extremities   Weakness generalized   Peripheral edema   Chronic systolic heart failure (HCC)   UTI (lower urinary tract infection)   T wave inversion in EKG   UTI (urinary tract infection)   Pressure ulcer   UTI, generalized weakness:  -Slowly improving. Will continue current antibiotics and will follow urine cultures.  -Urine culture showed multiple morphotypes, the urinalysis strongly positive will treat for 5-7 days.  Acute Chronic diastolic heart failure -Will continue IV lasix, daily weights, low sodium diet and strict intake/output.  -Probably triggered by diet indiscretion, poor compliance with medications and UTI.  New T-wave inversions on EKG anteroseptal -No changes today on EKG. Troponins negative. Asymptomatic. No further evaluation suggested at this moment.  Chronic kidney disease stage III  Baseline creatinine is 1.69 from June 2017, creatinine currently around baseline. It is 1.75.  Obstructive sleep apnea.  Diabetes mellitus type 2, appears stable. Will monitor CBG's as he will received steroids for gout flare.  Coronary artery disease: no CP. Will continue current medication regimen  Myeloproliferative disorder, Stable hemoglobin and platelet count. Will monitor trend.  Pressure Ulcer 12/29/15 Stage II; continue preventive measures and constant repositioning   Gout flare: probably from diuresis. Will start prednisone '20mg'$  X 7 days. PT consulted for assessment on  mobility.  Hyperkalemia -Potassium is 5.2, monitor closely.    DVT prophylaxis: Lovenox Code Status: full Family Communication: none at bedside  Disposition Plan: to be determined; will ask PT to evaluate and provide rec's  Consultants:  None   Procedures:  See below for x-ray reports   Antimicrobials:  Ceftriaxone 8/13 >>   Objective: Vitals:   12/31/15 1433 12/31/15 2007 12/31/15 2011 01/01/16 0405  BP: (!) 136/59 116/64  134/79  Pulse: 83 94 96 (!) 101  Resp: '19 18 18 18  '$ Temp: 99 F (37.2 C) 99.3 F (37.4 C)  100 F (37.8 C)  TempSrc: Oral Oral  Oral  SpO2: 96% 97% 94% 94%  Weight:    88.2 kg (194 lb 7.1 oz)  Height:        Intake/Output Summary (Last 24 hours) at 01/01/16 1438 Last data filed at 01/01/16 0824  Gross per 24 hour  Intake              240 ml  Output             1995 ml  Net            -1755 ml     Filed Weights   12/30/15 0528 12/31/15 0633 01/01/16 0405  Weight: 92.4 kg (203 lb 9.6 oz) 92.7 kg (204 lb 4.8 oz) 88.2 kg (194 lb 7.1 oz)    Exam:    Constitutional:  . Appears in no major distress; now requiring oxygen due to SOB overnight. Denies CP. Complaining of being weak and with increase pain on his knees bilaterally. Respiratory:  . Decrease BS at bases; scattered rhonchi; no wheezing.  Marland Kitchen Respiratory effort normal. No retractions or accessory muscle use Cardiovascular:  . RRR, no m/r/g; positive JVD .  2+ bilateral LE extremity edema   . Telemetry SR Extremities :  With 2+ edema bilaterally  Knees swollen and tender to movement Psychiatric:  . judgement and insight appear normal . Mental status appropriate; stable mood; no SI or hallucinations  I have personally reviewed following labs and imaging studies:  Blood sugars stable  BMP, BUN and creatinine improved.  Troponins negative.  Hemoglobin slightly lower, 7.7, appears to be a baseline  Scheduled Meds: . aspirin  325 mg Oral Daily  . carvedilol  6.25 mg  Oral BID  . cefTRIAXone (ROCEPHIN)  IV  1 g Intravenous Q24H  . enoxaparin (LOVENOX) injection  40 mg Subcutaneous Q24H  . fluticasone  2 spray Each Nare Daily  . furosemide  40 mg Intravenous Q12H  . hydrALAZINE  25 mg Oral Q8H  . isosorbide mononitrate  30 mg Oral BID  . predniSONE  20 mg Oral Q breakfast  . sodium chloride flush  3 mL Intravenous Q12H   Continuous Infusions:   Active Problems:   Myeloproliferative disease ([place])   Essential hypertension   Venous stasis dermatitis of both lower extremities   Weakness generalized   Peripheral edema   Chronic systolic heart failure (HCC)   UTI (lower urinary tract infection)   T wave inversion in EKG   UTI (urinary tract infection)   Pressure ulcer   Kekoa Fyock A [phone]   LOS: 3 days   Time spent 20 minutes

## 2016-01-01 NOTE — Telephone Encounter (Signed)
Form signed by PCP, and mailed back to [place] at [address] [place], [place] [address]. Copy sent for scanning.

## 2016-01-01 NOTE — Progress Notes (Signed)
PT Cancellation Note  Patient Details Name: Juan [name] MRN: [id number] DOB: 1941/01/06   Cancelled Treatment:    Reason Eval/Treat Not Completed: Patient declined, no reason specified. Pt reported that he was nauseated and refusing PT evaluation today. Pt stated that he would participate in an evaluation tomorrow and requested for that to be done in the AM. Pt was sitting OOB in recliner and stated that the nurses used lift equipment to move him from the bed to the recliner. PT will continue to f/u with pt as appropriate.   [place] 01/01/2016, 1:49 PM Sherie Don, [place], DPT [phone]

## 2016-01-01 NOTE — Care Management Important Message (Signed)
Important Message  Patient Details  Name: Juan [name] MRN: [id number] Date of Birth: [birth year]   Medicare Important Message Given:  Yes    Shaquoya Cosper [name] 01/01/2016, 10:17 AM

## 2016-01-02 DIAGNOSIS — I8312 Varicose veins of left lower extremity with inflammation: Secondary | ICD-10-CM

## 2016-01-02 DIAGNOSIS — I8311 Varicose veins of right lower extremity with inflammation: Secondary | ICD-10-CM

## 2016-01-02 DIAGNOSIS — N390 Urinary tract infection, site not specified: Secondary | ICD-10-CM

## 2016-01-02 LAB — BASIC METABOLIC PANEL
ANION GAP: 9 (ref 5–15)
BUN: 64 mg/dL — ABNORMAL HIGH (ref 6–20)
CHLORIDE: 103 mmol/L (ref 101–111)
CO2: 23 mmol/L (ref 22–32)
Calcium: 9.5 mg/dL (ref 8.9–10.3)
Creatinine, Ser: 1.8 mg/dL — ABNORMAL HIGH (ref 0.61–1.24)
GFR calc Af Amer: 41 mL/min — ABNORMAL LOW (ref >60)
GFR, EST NON AFRICAN AMERICAN: 35 mL/min — AB (ref >60)
Glucose, Bld: 148 mg/dL — ABNORMAL HIGH (ref 65–99)
POTASSIUM: 4.7 mmol/L (ref 3.5–5.1)
SODIUM: 135 mmol/L (ref 135–145)

## 2016-01-02 LAB — GLUCOSE, CAPILLARY
GLUCOSE-CAPILLARY: 123 mg/dL — AB (ref 65–99)
GLUCOSE-CAPILLARY: 141 mg/dL — AB (ref 65–99)
Glucose-Capillary: 138 mg/dL — ABNORMAL HIGH (ref 65–99)
Glucose-Capillary: 127 mg/dL — ABNORMAL HIGH (ref 65–99)

## 2016-01-02 NOTE — Care Management Note (Addendum)
Case Management Note  Patient Details  Name: Juan [name] MRN: [id number] Date of Birth: 10/23/1940  Subjective/Objective:  75 y.o. M admitted with DHF. Lives with son. PT evaluation recommending HHPT. Pt reports he has all DME; RW, [employer], and BSC. Reports he has used [facility] in past and would like to use them again. Confirmed address and phone number on facesheet as correct. Spoke with Tiffany, [facility] representative to confirm referral for this pt.                   Action/Plan: Anticipate discharge home Friday 01/03/2016. Marland Kitchen No further CM needs but will be available should additional discharge needs arise.   Expected Discharge Date:                  Expected Discharge Plan:  Home/Self Care  In-House Referral:     Discharge planning Services  CM Consult  Post Acute Care Choice:  NA, Home Health Choice offered to:     DME Arranged:    DME Agency:     HH Arranged:  PT HH Agency:     Status of Service:  In process, will continue to follow  If discussed at Long Length of Stay Meetings, dates discussed:    Additional Comments:  Delrae Sawyers, RN 01/02/2016, 2:40 PM

## 2016-01-02 NOTE — Progress Notes (Signed)
PROGRESS NOTE  Juan [name] ZOX:[id number] DOB: Dec 12, 1940 DOA: 12/29/2015 PCP: [name], MD  Brief Narrative: 75 year old man with diastolic heart failure presented with generalized weakness, increasing lower extremity edema, shortness of breath, dysuria. Admitted for UTI.   HPI/Subjective: Still complaining about being tired, denies he declined PT visit yesterday. PT to evaluate, CSW to see likely to D/C in AM  Assessment/Plan:  Active Problems:   Myeloproliferative disease ([place])   Essential hypertension   Venous stasis dermatitis of both lower extremities   Weakness generalized   Peripheral edema   Chronic systolic heart failure (HCC)   UTI (lower urinary tract infection)   T wave inversion in EKG   UTI (urinary tract infection)   Pressure ulcer   UTI, generalized weakness:  -Slowly improving. Will continue current antibiotics and will follow urine cultures.  -Urine culture showed multiple morphotypes, the urinalysis strongly positive will treat for 5-7 days.  Acute Chronic diastolic heart failure -Will continue IV lasix, daily weights, low sodium diet and strict intake/output.  -Probably triggered by diet indiscretion, poor compliance with medications and UTI.  New T-wave inversions on EKG anteroseptal -No changes today on EKG. Troponins negative. Asymptomatic. No further evaluation suggested at this moment.  Chronic kidney disease stage III  Baseline creatinine is 1.69 from June 2017, creatinine currently around baseline. It is 1.75. Check BMP in AM  Obstructive sleep apnea.  Diabetes mellitus type 2, appears stable. Will monitor CBG's as he will received steroids for gout flare.  Coronary artery disease: no CP. Will continue current medication regimen  Myeloproliferative disorder, Stable hemoglobin and platelet count. Will monitor trend.  Pressure Ulcer 12/29/15 Stage II; continue preventive measures and constant repositioning   Gout flare: probably from  diuresis. Will start prednisone '20mg'$  X 7 days. PT consulted for assessment on mobility.  Hyperkalemia -Potassium is 5.2, monitor closely. Check BMP    DVT prophylaxis: Lovenox Code Status: full Family Communication: none at bedside  Disposition Plan: TBD, likely to SNF in AM  Consultants:  None   Procedures:  See below for x-ray reports   Antimicrobials:  Ceftriaxone 8/13 >>   Objective: Vitals:   01/01/16 1130 01/01/16 1409 01/01/16 2043 01/02/16 0607  BP: 116/74 105/69 117/65 105/69  Pulse: (!) 102 90 96 95  Resp: '20 20 20 20  '$ Temp: 99.1 F (37.3 C) 98.1 F (36.7 C) 98.1 F (36.7 C) 98.4 F (36.9 C)  TempSrc: Oral Oral Oral Oral  SpO2: 95% 98% 96% 97%  Weight:      Height:        Intake/Output Summary (Last 24 hours) at 01/02/16 0949 Last data filed at 01/02/16 0852  Gross per 24 hour  Intake             1203 ml  Output             1100 ml  Net              103 ml     Filed Weights   12/30/15 0528 12/31/15 0633 01/01/16 0405  Weight: 92.4 kg (203 lb 9.6 oz) 92.7 kg (204 lb 4.8 oz) 88.2 kg (194 lb 7.1 oz)    Exam:    Constitutional:  . Appears in no major distress; now requiring oxygen due to SOB overnight. Denies CP. Complaining of being weak and with increase pain on his knees bilaterally. Respiratory:  . Decrease BS at bases; scattered rhonchi; no wheezing.  Marland Kitchen Respiratory effort normal. No retractions or accessory muscle  use Cardiovascular:  . RRR, no m/r/g; positive JVD . 2+ bilateral LE extremity edema   . Telemetry SR Extremities :  With 2+ edema bilaterally  Knees swollen and tender to movement Psychiatric:  . judgement and insight appear normal . Mental status appropriate; stable mood; no SI or hallucinations  I have personally reviewed following labs and imaging studies:  Blood sugars stable  BMP, BUN and creatinine improved.  Troponins negative.  Hemoglobin slightly lower, 7.7, appears to be a baseline  Scheduled  Meds: . aspirin  325 mg Oral Daily  . carvedilol  6.25 mg Oral BID  . cefTRIAXone (ROCEPHIN)  IV  1 g Intravenous Q24H  . enoxaparin (LOVENOX) injection  40 mg Subcutaneous Q24H  . fluticasone  2 spray Each Nare Daily  . furosemide  40 mg Intravenous Q12H  . hydrALAZINE  25 mg Oral Q8H  . isosorbide mononitrate  30 mg Oral BID  . predniSONE  20 mg Oral Q breakfast  . sodium chloride flush  3 mL Intravenous Q12H   Continuous Infusions:   Active Problems:   Myeloproliferative disease ([place])   Essential hypertension   Venous stasis dermatitis of both lower extremities   Weakness generalized   Peripheral edema   Chronic systolic heart failure (HCC)   UTI (lower urinary tract infection)   T wave inversion in EKG   UTI (urinary tract infection)   Pressure ulcer   Juan [name] A [phone]   LOS: 4 days   Time spent 20 minutes

## 2016-01-02 NOTE — Evaluation (Signed)
Physical Therapy Evaluation Patient Details Name: Juan [name] MRN: [id number] DOB: [birth year] Today's Date: 01/02/2016   History of Present Illness  Pt is a 75 y/o male admitted for a UTI and weakness. PMH including but not limited to diastolic heart failure, HTN and CKD.  Clinical Impression  Pt presented sitting OOB in recliner when PT entered room. Pt was awake and willing to participate in therapy session. Prior to admission, pt was ambulating within his home with a RW and would use his w/c for longer distance mobility within his community. Pt lives with his son her performs all IADLs. Pt was able to perform STS transfer x2 from recliner with min guard and use of RW. Pt also able to ambulate approximately 100 ft with RW and min guard for safety, no physical assistance required. Pt would continue to benefit from skilled physical therapy services at this time while admitted and after d/c to address his limitations in order to improve his overall safety and independence with functional mobility. PT recommending pt d/c home with [facility] PT services.     Follow Up Recommendations Home health PT;Supervision for mobility/OOB    Equipment Recommendations  None recommended by PT    Recommendations for Other Services       Precautions / Restrictions Precautions Precautions: Fall Restrictions Weight Bearing Restrictions: No      Mobility  Bed Mobility               General bed mobility comments: pt was OOB in recliner when PT entered room  Transfers Overall transfer level: Needs assistance Equipment used: Rolling walker (2 wheeled) Transfers: Sit to/from Stand Sit to Stand: Min guard         General transfer comment: pt required increased time and VC'ing for bilateral hand positioning  Ambulation/Gait Ambulation/Gait assistance: Min guard Ambulation Distance (Feet): 100 Feet Assistive device: Rolling walker (2 wheeled) Gait Pattern/deviations: Step-through pattern;Decreased  stride length Gait velocity: decreased Gait velocity interpretation: Below normal speed for age/gender    Stairs            Wheelchair Mobility    Modified Rankin (Stroke Patients Only)       Balance Overall balance assessment: Needs assistance Sitting-balance support: Feet supported;No upper extremity supported Sitting balance-Leahy Scale: Fair     Standing balance support: During functional activity;Bilateral upper extremity supported Standing balance-Leahy Scale: Poor Standing balance comment: pt relied on RW for support                             Pertinent Vitals/Pain Pain Assessment: No/denies pain    Home Living Family/patient expects to be discharged to:: Private residence Living Arrangements: Children Available Help at Discharge: Family;Available 24 hours/day Type of Home: Apartment Home Access: Level entry     Home Layout: One level Home Equipment: Walker - 2 wheels;Cane - single point;Shower seat      Prior Function Level of Independence: Needs assistance   Gait / Transfers Assistance Needed: pt ambulated with RW  ADL's / Homemaking Assistance Needed: Pt's son performs housekeeping and cooking.        Hand Dominance        Extremity/Trunk Assessment   Upper Extremity Assessment: Overall WFL for tasks assessed           Lower Extremity Assessment: Generalized weakness      Cervical / Trunk Assessment: Normal  Communication   Communication: No difficulties  Cognition Arousal/Alertness: Awake/alert Behavior During  Therapy: WFL for tasks assessed/performed Overall Cognitive Status: Within Functional Limits for tasks assessed                      General Comments      Exercises        Assessment/Plan    PT Assessment Patient needs continued PT services  PT Diagnosis Difficulty walking   PT Problem List Decreased strength;Decreased range of motion;Decreased activity tolerance;Decreased  balance;Decreased mobility;Decreased coordination  PT Treatment Interventions DME instruction;Gait training;Functional mobility training;Therapeutic activities;Therapeutic exercise;Neuromuscular re-education;Balance training;Patient/family education   PT Goals (Current goals can be found in the Care Plan section) Acute Rehab PT Goals Patient Stated Goal: return home PT Goal Formulation: With patient Time For Goal Achievement: 01/09/16 Potential to Achieve Goals: Good    Frequency Min 3X/week   Barriers to discharge        Co-evaluation               End of Session Equipment Utilized During Treatment: Gait belt Activity Tolerance: Patient limited by fatigue Patient left: in chair;with call [place]/phone within reach Nurse Communication: Mobility status         Time: 2174-7159 PT Time Calculation (min) (ACUTE ONLY): 31 min   Charges:   PT Evaluation $PT Eval Moderate Complexity: 1 Procedure PT Treatments $Gait Training: 8-22 mins   PT G CodesClearnce Sorrel Yash Cacciola 01/02/2016, 1:30 PM Sherie Don, [place], DPT [phone]

## 2016-01-03 DIAGNOSIS — N342 Other urethritis: Secondary | ICD-10-CM

## 2016-01-03 DIAGNOSIS — L8992 Pressure ulcer of unspecified site, stage 2: Secondary | ICD-10-CM

## 2016-01-03 DIAGNOSIS — D72829 Elevated white blood cell count, unspecified: Secondary | ICD-10-CM

## 2016-01-03 DIAGNOSIS — R9431 Abnormal electrocardiogram [ECG] [EKG]: Secondary | ICD-10-CM

## 2016-01-03 LAB — CBC
HEMATOCRIT: 28.8 % — AB (ref 39.0–52.0)
HEMOGLOBIN: 8.7 g/dL — AB (ref 13.0–17.0)
MCH: 24.5 pg — AB (ref 26.0–34.0)
MCHC: 30.2 g/dL (ref 30.0–36.0)
MCV: 81.1 fL (ref 78.0–100.0)
Platelets: 745 10*3/uL — ABNORMAL HIGH (ref 150–400)
RBC: 3.55 MIL/uL — AB (ref 4.22–5.81)
RDW: 18.9 % — ABNORMAL HIGH (ref 11.5–15.5)
WBC: 24.5 10*3/uL — ABNORMAL HIGH (ref 4.0–10.5)

## 2016-01-03 LAB — BASIC METABOLIC PANEL
ANION GAP: 12 (ref 5–15)
BUN: 72 mg/dL — ABNORMAL HIGH (ref 6–20)
CHLORIDE: 101 mmol/L (ref 101–111)
CO2: 23 mmol/L (ref 22–32)
CREATININE: 1.97 mg/dL — AB (ref 0.61–1.24)
Calcium: 9.6 mg/dL (ref 8.9–10.3)
GFR calc non Af Amer: 32 mL/min — ABNORMAL LOW (ref >60)
GFR, EST AFRICAN AMERICAN: 37 mL/min — AB (ref >60)
Glucose, Bld: 110 mg/dL — ABNORMAL HIGH (ref 65–99)
POTASSIUM: 4.5 mmol/L (ref 3.5–5.1)
Sodium: 136 mmol/L (ref 135–145)

## 2016-01-03 LAB — CULTURE, BLOOD (ROUTINE X 2)
CULTURE: NO GROWTH
Culture: NO GROWTH

## 2016-01-03 MED ORDER — CIPROFLOXACIN HCL 500 MG PO TABS: 500.0000 mg | ORAL_TABLET | Freq: Two times a day (BID) | ORAL | 0 refills | Status: DC

## 2016-01-03 MED ORDER — CEFUROXIME AXETIL 500 MG PO TABS: 500.0000 mg | ORAL_TABLET | Freq: Two times a day (BID) | ORAL | 0 refills | Status: DC

## 2016-01-03 NOTE — Discharge Summary (Addendum)
Physician Discharge Summary  Juan [name] XBM:[id number] DOB: Jan 15, 1941 DOA: 12/29/2015  PCP: [name], MD  Admit date: 12/29/2015 Discharge date: 01/03/2016  Admitted From: Home Disposition: Home  Recommendations for Outpatient Follow-up:  1. Follow up with PCP in 1-2 weeks 2. Please obtain BMP/CBC in one week 3. Continue Ceftin for 5 more days.  Home Health: Yes Equipment/Devices: None recommended by PT, patient reported that he has a wheelchair at home.  Discharge Condition: Stable CODE STATUS: Full code Diet recommendation: Heart Healthy  Brief/Interim Summary: Juan [name] is a 75 y.o. male with a medical history significant for, but not  limited to, diastolic heart failure, OSA, hypertension and chronic kidney disease. Patient had an echocardiogram on the eighth of this month and since that time has gotten progressively weaker. Patient was able to walk without his cane or walker up until a few days ago now he is unable to walk with either . He complains of "cold sweats"  He has a poor appetite and complains of progressive lower extremity swelling. Patient avoids salt in his diet. He is compliant with home medications. He complains of mild dysuria.  Discharge Diagnoses:  Active Problems:   Myeloproliferative disease ([place])   Essential hypertension   Leukocytosis   Venous stasis dermatitis of both lower extremities   Weakness generalized   Peripheral edema   Chronic systolic heart failure (HCC)   UTI (lower urinary tract infection)   T wave inversion in EKG   UTI (urinary tract infection)   Pressure ulcer stage II   UTI, generalized weakness:  -Slowly improving. Will continue current antibiotics and will follow urine cultures.  -Urine culture showed multiple morphotypes, the urinalysis strongly positive with TNTC. -Discharged on initially 5 more days of Ceftin, pt reported he can not afford the $29.99 and medication switched to Cipro. -His daughter was called by RN and reported  she is out of town and can not come and pick him up. His son does not have cell phone and dose not drive. -Ambulance called for transportation and the case manager Mrs Viona Gilmore. Stann Mainland paid out of her pocket for his Abx.  Acute on chronic diastolic heart failure -Has significant lower extremity edema, treated with IV Lasix, daily weight and low sodium diet treated -Probably triggered by diet indiscretion, poor compliance with medications and UTI. -Weight down from 203 down to 194 pounds and 7 ounces on discharge. -Restarted back on his home medications, pressed the importance of dietary discretion.  New T-wave inversions on EKG anteroseptal -No changes today on EKG. Troponins negative. Asymptomatic. No further evaluation suggested at this moment.  Chronic kidney disease stage III  -Baseline creatinine is 1.69 from June 2017, creatinine currently around baseline. It is 1.75. -Creatinine is 1.9 on discharge, this is still considered to be around his baseline. -Patient is on Lasix, his home dose restarted at the time of discharge.  Obstructive sleep apnea.  Diabetes mellitus type 2, appears stable. Will monitor CBG's as he will received steroids for gout flare.  Coronary artery disease: no CP. Will continue current medication regimen  Myeloproliferative disorder, Stable hemoglobin and platelet count. Will monitor trend.  Pressure Ulcer Stage II, present on discharge, documented by nursing staff on admission day.; continue preventive measures and constant repositioning   Gout flare: probably from diuresis. Will start prednisone '20mg'$  X 7 days. PT consulted for assessment on mobility.  Hyperkalemia -Potassium is 5.2, monitor closely. This is resolved.  Leukocytosis -Has significant leukocytosis, this is likely secondary to his  myeloproliferative disorder.  Discharge Instructions  Discharge Instructions    Diet - low sodium heart healthy    Complete by:  As directed   Increase  activity slowly    Complete by:  As directed       Medication List    TAKE these medications   acetaminophen 500 MG tablet Commonly known as:  TYLENOL Take 500 mg by mouth every 6 (six) hours as needed for moderate pain or headache. Reported on 05/02/2015   aspirin 325 MG tablet Take 1 tablet (325 mg total) by mouth daily.   carvedilol 6.25 MG tablet Commonly known as:  COREG Take 1 tablet (6.25 mg total) by mouth 2 (two) times daily.   ciprofloxacin 500 MG tablet Commonly known as:  CIPRO Take 1 tablet (500 mg total) by mouth 2 (two) times daily.   feeding supplement (GLUCERNA SHAKE) Liqd Take 237 mLs by mouth as needed (protein). Reported on 08/21/2015   ferrous gluconate 225 (27 Fe) MG tablet Commonly known as:  FERGON Take 240 mg by mouth daily at 10 pm.   fluticasone 50 MCG/ACT nasal spray Commonly known as:  FLONASE Place 2 sprays into both nostrils daily. Reported on 05/02/2015   gabapentin 100 MG capsule Commonly known as:  NEURONTIN Take 100 mg by mouth 3 (three) times daily as needed (pain). Reported on 08/22/2015   hydrALAZINE 25 MG tablet Commonly known as:  APRESOLINE TAKE 2 TABLETS BY MOUTH 3 TIMES DAILY.   hydrocerin Crea Apply Eucerin cream to BLE Q day after bathing and roughly towel drying to remove loose skin   HYDROcodone-acetaminophen 5-325 MG tablet Commonly known as:  NORCO/VICODIN Take 1 tablet by mouth every 6 (six) hours as needed for moderate pain.   isosorbide mononitrate 30 MG 24 hr tablet Commonly known as:  IMDUR Take 1 tablet (30 mg total) by mouth 2 (two) times daily.   simethicone 80 MG chewable tablet Commonly known as:  MYLICON Chew 80 mg by mouth every 6 (six) hours as needed for flatulence.   torsemide 20 MG tablet Commonly known as:  DEMADEX Take 4 tablets (80 mg total) by mouth daily.   vitamin B-12 100 MCG tablet Commonly known as:  CYANOCOBALAMIN Take 100 mcg by mouth daily.       No Known  Allergies  Consultations:  None   Procedures/Studies: Dg Chest Port 1 View  Result Date: 12/29/2015 CLINICAL DATA:  Cough and fever EXAM: PORTABLE CHEST 1 VIEW COMPARISON:  01/21/2015 chest radiograph. FINDINGS: Low lung volumes. Stable cardiomediastinal silhouette with mild cardiomegaly. No pneumothorax. No pleural effusion. Hazy opacities are present throughout the parahilar and basilar lungs bilaterally, not definitely changed. Stable diffuse osteosclerosis. IMPRESSION: Low lung volumes. No appreciable change in hazy parahilar and basilar lung opacities bilaterally, favor mild pulmonary edema given the mild cardiomegaly. Stable diffuse osteosclerosis consistent with history of myeloproliferative disease. Electronically Signed   By: Ilona Sorrel M.D.   On: 12/29/2015 12:12   (Echo, Carotid, EGD, Colonoscopy, ERCP)    Subjective:   Discharge Exam: Vitals:   01/03/16 0446 01/03/16 1413  BP: 119/67 115/68  Pulse: 80 76  Resp: 17 18  Temp: 98.5 F (36.9 C) 98.1 F (36.7 C)   Vitals:   01/02/16 1429 01/02/16 2040 01/03/16 0446 01/03/16 1413  BP: 112/67 114/69 119/67 115/68  Pulse: 85 74 80 76  Resp: '18 17 17 18  '$ Temp: 98.2 F (36.8 C) 98.7 F (37.1 C) 98.5 F (36.9 C) 98.1 F (36.7 C)  TempSrc: Oral Oral Oral Oral  SpO2: 96% 93% 98% 98%  Weight:      Height:        General: Pt is alert, awake, not in acute distress Cardiovascular: RRR, S1/S2 +, no rubs, no gallops Respiratory: CTA bilaterally, no wheezing, no rhonchi Abdominal: Soft, NT, ND, bowel sounds + Extremities: no edema, no cyanosis    The results of significant diagnostics from this hospitalization (including imaging, microbiology, ancillary and laboratory) are listed below for reference.     Microbiology: Recent Results (from the past 240 hour(s))  Blood Culture (routine x 2)     Status: None   Collection Time: 12/29/15 11:13 AM  Result Value Ref Range Status   Specimen Description BLOOD LEFT  ANTECUBITAL  Final   Special Requests BOTTLES DRAWN AEROBIC AND ANAEROBIC 5CC  Final   Culture NO GROWTH 5 DAYS  Final   Report Status 01/03/2016 FINAL  Final  Blood Culture (routine x 2)     Status: None   Collection Time: 12/29/15 11:14 AM  Result Value Ref Range Status   Specimen Description BLOOD RIGHT ANTECUBITAL  Final   Special Requests IN PEDIATRIC BOTTLE 2CC  Final   Culture NO GROWTH 5 DAYS  Final   Report Status 01/03/2016 FINAL  Final  Urine culture     Status: Abnormal   Collection Time: 12/29/15 11:35 AM  Result Value Ref Range Status   Specimen Description URINE, RANDOM  Final   Special Requests NONE  Final   Culture MULTIPLE SPECIES PRESENT, SUGGEST RECOLLECTION (A)  Final   Report Status 12/31/2015 FINAL  Final     Labs: BNP (last 3 results)  Recent Labs  05/23/15 1150 07/23/15 1141 12/29/15 1607  BNP 355.7* 250.0* 696.2*   Basic Metabolic Panel:  Recent Labs Lab 12/29/15 1113 12/30/15 0323 01/01/16 0511 01/02/16 0931 01/03/16 0447  NA 134* 138 136 135 136  K 4.8 4.3 5.2* 4.7 4.5  CL 103 107 105 103 101  CO2 '23 23 23 23 23  '$ GLUCOSE 118* 109* 117* 148* 110*  BUN 71* 64* 63* 64* 72*  CREATININE 1.82* 1.63* 1.75* 1.80* 1.97*  CALCIUM 9.7 9.3 9.3 9.5 9.6   Liver Function Tests:  Recent Labs Lab 12/29/15 1113  AST 28  ALT 13*  ALKPHOS 70  BILITOT 0.5  PROT 6.8  ALBUMIN 3.3*   No results for input(s): LIPASE, AMYLASE in the last 168 hours. No results for input(s): AMMONIA in the last 168 hours. CBC:  Recent Labs Lab 12/29/15 1113 12/29/15 1607 12/30/15 0323 01/01/16 0511 01/03/16 0447  WBC 17.5* 14.7* 15.1* 18.9* 24.5*  NEUTROABS 10.0*  --   --   --   --   HGB 8.5* 8.6* 7.7* 8.3* 8.7*  HCT 27.4* 28.2* 24.4* 27.1* 28.8*  MCV 81.1 81.0 79.7 81.4 81.1  PLT 466* 409* 402* 537* 745*   Cardiac Enzymes:  Recent Labs Lab 12/29/15 1607 12/29/15 2115 12/30/15 0323  TROPONINI <0.03 <0.03 <0.03   BNP: Invalid input(s):  POCBNP CBG:  Recent Labs Lab 01/01/16 2158 01/02/16 0748 01/02/16 1145 01/02/16 1716 01/02/16 2136  GLUCAP 147* 123* 141* 138* 127*   D-Dimer No results for input(s): DDIMER in the last 72 hours. Hgb A1c No results for input(s): HGBA1C in the last 72 hours. Lipid Profile No results for input(s): CHOL, HDL, LDLCALC, TRIG, CHOLHDL, LDLDIRECT in the last 72 hours. Thyroid function studies No results for input(s): TSH, T4TOTAL, T3FREE, THYROIDAB in the last 72 hours.  Invalid input(s): FREET3 Anemia work up No results for input(s): VITAMINB12, FOLATE, FERRITIN, TIBC, IRON, RETICCTPCT in the last 72 hours. Urinalysis    Component Value Date/Time   COLORURINE YELLOW 12/29/2015 1135   APPEARANCEUR TURBID (A) 12/29/2015 1135   LABSPEC 1.011 12/29/2015 1135   PHURINE 5.5 12/29/2015 1135   GLUCOSEU NEGATIVE 12/29/2015 1135   GLUCOSEU NEGATIVE 05/02/2015 1008   HGBUR NEGATIVE 12/29/2015 1135   BILIRUBINUR NEGATIVE 12/29/2015 1135   KETONESUR NEGATIVE 12/29/2015 1135   PROTEINUR 100 (A) 12/29/2015 1135   UROBILINOGEN 0.2 05/02/2015 1008   NITRITE NEGATIVE 12/29/2015 1135   LEUKOCYTESUR LARGE (A) 12/29/2015 1135   Sepsis Labs Invalid input(s): PROCALCITONIN,  WBC,  LACTICIDVEN Microbiology Recent Results (from the past 240 hour(s))  Blood Culture (routine x 2)     Status: None   Collection Time: 12/29/15 11:13 AM  Result Value Ref Range Status   Specimen Description BLOOD LEFT ANTECUBITAL  Final   Special Requests BOTTLES DRAWN AEROBIC AND ANAEROBIC 5CC  Final   Culture NO GROWTH 5 DAYS  Final   Report Status 01/03/2016 FINAL  Final  Blood Culture (routine x 2)     Status: None   Collection Time: 12/29/15 11:14 AM  Result Value Ref Range Status   Specimen Description BLOOD RIGHT ANTECUBITAL  Final   Special Requests IN PEDIATRIC BOTTLE 2CC  Final   Culture NO GROWTH 5 DAYS  Final   Report Status 01/03/2016 FINAL  Final  Urine culture     Status: Abnormal   Collection  Time: 12/29/15 11:35 AM  Result Value Ref Range Status   Specimen Description URINE, RANDOM  Final   Special Requests NONE  Final   Culture MULTIPLE SPECIES PRESENT, SUGGEST RECOLLECTION (A)  Final   Report Status 12/31/2015 FINAL  Final     Time coordinating discharge: Over 30 minutes  SIGNED:   Birdie Hopes, MD  [facility] 01/03/2016, 6:39 PM Pager   If 7PM-7AM, please contact night-coverage [email or web address] Password [email or web address]

## 2016-01-03 NOTE — Care Management (Addendum)
ED CM contacted concerning patient not being able to afford discharge  Antibiotic.  CM contacted [facility] OP pharmacy, cost $29.00. Patient states, he cannot afford the cost. Patient has insurance does not meet criteria for medication assistance. CM suggested that discharging nurse Blanch Media contact MD to change to a more affordable antibiotic possibly keflex (cephalosporin).

## 2016-01-03 NOTE — Progress Notes (Addendum)
Physical Therapy Treatment Patient Details Name: Juan [name] MRN: [id number] DOB: 1941/02/20 Today's Date: 01/03/2016    History of Present Illness Pt is a 75 y/o male admitted for a UTI and weakness. PMH including but not limited to diastolic heart failure, HTN and CKD.    PT Comments    Pt presented sitting OOB in recliner when PT entered room. Pt making good progress towards achieving his goals and was able to ambulate a greater distance as compared to previous session. Pt would continue to benefit from skilled physical therapy services at this time while admitted and after d/c to address his limitations in order to improve his overall safety and independence with functional mobility.    Follow Up Recommendations  Home health PT;Supervision for mobility/OOB     Equipment Recommendations  None recommended by PT    Recommendations for Other Services       Precautions / Restrictions Precautions Precautions: Fall Restrictions Weight Bearing Restrictions: No    Mobility  Bed Mobility               General bed mobility comments: pt was OOB in recliner when PT entered room  Transfers Overall transfer level: Needs assistance Equipment used: Rolling walker (2 wheeled) Transfers: Sit to/from Stand Sit to Stand: Min guard         General transfer comment: pt required increased time  Ambulation/Gait Ambulation/Gait assistance: Min guard Ambulation Distance (Feet): 150 Feet Assistive device: Rolling walker (2 wheeled) Gait Pattern/deviations: Step-through pattern;Decreased stride length Gait velocity: decreased Gait velocity interpretation: Below normal speed for age/gender     Stairs            Wheelchair Mobility    Modified Rankin (Stroke Patients Only)       Balance Overall balance assessment: Needs assistance Sitting-balance support: Feet supported;No upper extremity supported Sitting balance-Leahy Scale: Fair     Standing balance support:  Bilateral upper extremity supported;During functional activity Standing balance-Leahy Scale: Poor Standing balance comment: pt reliant on RW for support and stability                    Cognition Arousal/Alertness: Awake/alert Behavior During Therapy: WFL for tasks assessed/performed Overall Cognitive Status: Within Functional Limits for tasks assessed                      Exercises      General Comments        Pertinent Vitals/Pain Pain Assessment: No/denies pain    Home Living                      Prior Function            PT Goals (current goals can now be found in the care plan section) Acute Rehab PT Goals Patient Stated Goal: return home PT Goal Formulation: With patient Time For Goal Achievement: 01/09/16 Potential to Achieve Goals: Good Progress towards PT goals: Progressing toward goals    Frequency  Min 3X/week    PT Plan Current plan remains appropriate    Co-evaluation             End of Session Equipment Utilized During Treatment: Gait belt Activity Tolerance: Patient limited by fatigue Patient left: in chair;with call Deak/phone within reach     Time: 1017-1036 PT Time Calculation (min) (ACUTE ONLY): 19 min  Charges:  $Gait Training: 8-22 mins  G CodesClearnce Sorrel Donyea Beverlin 01-26-2016, 11:17 AM Sherie Don, PT, DPT [phone]

## 2016-01-04 NOTE — Progress Notes (Signed)
Pt. Resting in bed.  [facility] here to transport pt. Home.  Pt. Belongings with him at time of discharge.  Pt. Reports that his son is aware that he is coming home today.  Patient reports that he also has house keys.  [facility] to to transport patient home.

## 2016-01-06 ENCOUNTER — Telehealth: Payer: Self-pay | Admitting: Behavioral Health

## 2016-01-06 NOTE — Telephone Encounter (Signed)
Attempted to reach patient for TCM/Hospital Follow-up call. Unable to leave a message at this time; per recording, the patient's voice mailbox has not been set up.

## 2016-01-09 MED FILL — hydrALAZINE HCL 25 MG TABS: 25 | 30 days supply | Qty: 180 | Fill #1

## 2016-01-09 MED FILL — TORSEMIDE 20 MG TABLET: 20 | 30 days supply | Qty: 120 | Fill #3

## 2016-01-09 MED FILL — CARVEDILOL 6.25 MG TABLET: 6.25 | 30 days supply | Qty: 60 | Fill #1

## 2016-01-09 MED FILL — ISOSORBIDE MN ER 30 MG TAB: 30 | 30 days supply | Qty: 60 | Fill #2

## 2016-02-06 ENCOUNTER — Other Ambulatory Visit (HOSPITAL_COMMUNITY): Payer: Self-pay | Admitting: Cardiology

## 2016-02-06 MED FILL — TORSEMIDE 20 MG TABLET: 20 | 30 days supply | Qty: 120 | Fill #0

## 2016-02-06 MED FILL — hydrALAZINE HCL 25 MG TABS: 25 | 30 days supply | Qty: 180 | Fill #2

## 2016-02-06 MED FILL — CARVEDILOL 6.25 MG TABLET: 6.25 | 30 days supply | Qty: 60 | Fill #2

## 2016-02-06 MED FILL — ISOSORBIDE MN ER 30 MG TAB: 30 | 30 days supply | Qty: 60 | Fill #3

## 2016-02-07 ENCOUNTER — Telehealth (HOSPITAL_COMMUNITY): Payer: Self-pay | Admitting: *Deleted

## 2016-02-07 NOTE — Telephone Encounter (Signed)
I called [employer] to pick up patient at 9am to take him to the outpatient pharmacy and then a return trip home. [employer] confirmed they would be there to pick patient up by 9am

## 2016-02-18 ENCOUNTER — Telehealth: Payer: Self-pay

## 2016-02-18 NOTE — Telephone Encounter (Signed)
Received from [facility].  The form is for patient's recertification in the Home based Palliative Care program.  Form forwarded to PCP for review and signature.

## 2016-02-19 NOTE — Telephone Encounter (Signed)
Forms signed by PCP and mailed back to Hospice in envelope provided. Copy of form sent for scanning.

## 2016-03-05 ENCOUNTER — Telehealth: Payer: Self-pay | Admitting: Internal Medicine

## 2016-03-05 NOTE — Telephone Encounter (Signed)
Spoke w/ Pt, he is requesting information for [facility] be typed and mailed to him.   [facility] [address] [place] [address] [phone]

## 2016-03-05 NOTE — Telephone Encounter (Signed)
Chart reviewed, was bed order supposed to be sent or have we previously sent order?

## 2016-03-05 NOTE — Telephone Encounter (Signed)
Relation to DT:OIZT Call back number:[phone]   Reason for call:  Patient would like a letter mailed to home with the address and telephone number to where Dr. Larose Kells ordered he's bed from, patient would like to speak with nurse. Please advise

## 2016-03-05 NOTE — Telephone Encounter (Signed)
Tried calling Pt, no answer. Voicemail box not set up to leave message.

## 2016-03-05 NOTE — Telephone Encounter (Signed)
I'm not opposed for him to get a hospital bed if necessary. Please discuss with hospice

## 2016-03-09 ENCOUNTER — Other Ambulatory Visit (HOSPITAL_COMMUNITY): Payer: Self-pay | Admitting: Student

## 2016-03-09 ENCOUNTER — Other Ambulatory Visit (HOSPITAL_COMMUNITY): Payer: Self-pay | Admitting: Adult Health

## 2016-03-09 DIAGNOSIS — I5022 Chronic systolic (congestive) heart failure: Secondary | ICD-10-CM

## 2016-03-09 MED FILL — hydrALAZINE HCL 25 MG TABS: 25 | 30 days supply | Qty: 180 | Fill #3

## 2016-03-09 MED FILL — TORSEMIDE 20 MG TABLET: 20 | 30 days supply | Qty: 120 | Fill #1

## 2016-03-10 MED FILL — CARVEDILOL 6.25 MG TABLET: 6.25 | 30 days supply | Qty: 60 | Fill #0

## 2016-03-10 MED FILL — ISOSORBIDE MN ER 30 MG TAB: 30 | 30 days supply | Qty: 60 | Fill #0

## 2016-03-26 ENCOUNTER — Encounter (HOSPITAL_COMMUNITY): Payer: Self-pay

## 2016-03-26 ENCOUNTER — Ambulatory Visit (HOSPITAL_COMMUNITY)
Admission: RE | Admit: 2016-03-26 | Discharge: 2016-03-26 | Disposition: A | Discharge status: Home / Self Care | Payer: Medicare Other | Source: Ambulatory Visit | Attending: Cardiology | Admitting: Cardiology

## 2016-03-26 VITALS — BP 138/78 | HR 58 | Wt 211.4 lb

## 2016-03-26 DIAGNOSIS — Z8249 Family history of ischemic heart disease and other diseases of the circulatory system: Secondary | ICD-10-CM | POA: Diagnosis not present

## 2016-03-26 DIAGNOSIS — I5042 Chronic combined systolic (congestive) and diastolic (congestive) heart failure: Secondary | ICD-10-CM | POA: Diagnosis not present

## 2016-03-26 DIAGNOSIS — E1122 Type 2 diabetes mellitus with diabetic chronic kidney disease: Secondary | ICD-10-CM | POA: Insufficient documentation

## 2016-03-26 DIAGNOSIS — Z7982 Long term (current) use of aspirin: Secondary | ICD-10-CM | POA: Diagnosis not present

## 2016-03-26 DIAGNOSIS — I712 Thoracic aortic aneurysm, without rupture: Secondary | ICD-10-CM | POA: Insufficient documentation

## 2016-03-26 DIAGNOSIS — Z87891 Personal history of nicotine dependence: Secondary | ICD-10-CM | POA: Insufficient documentation

## 2016-03-26 DIAGNOSIS — N183 Chronic kidney disease, stage 3 unspecified: Secondary | ICD-10-CM

## 2016-03-26 DIAGNOSIS — R21 Rash and other nonspecific skin eruption: Secondary | ICD-10-CM | POA: Diagnosis not present

## 2016-03-26 DIAGNOSIS — I5022 Chronic systolic (congestive) heart failure: Secondary | ICD-10-CM | POA: Diagnosis not present

## 2016-03-26 DIAGNOSIS — Z9119 Patient's noncompliance with other medical treatment and regimen: Secondary | ICD-10-CM | POA: Diagnosis not present

## 2016-03-26 DIAGNOSIS — Z833 Family history of diabetes mellitus: Secondary | ICD-10-CM | POA: Diagnosis not present

## 2016-03-26 DIAGNOSIS — I130 Hypertensive heart and chronic kidney disease with heart failure and stage 1 through stage 4 chronic kidney disease, or unspecified chronic kidney disease: Secondary | ICD-10-CM | POA: Diagnosis not present

## 2016-03-26 DIAGNOSIS — D471 Chronic myeloproliferative disease: Secondary | ICD-10-CM | POA: Diagnosis not present

## 2016-03-26 DIAGNOSIS — Z800 Family history of malignant neoplasm of digestive organs: Secondary | ICD-10-CM | POA: Insufficient documentation

## 2016-03-26 DIAGNOSIS — I2510 Atherosclerotic heart disease of native coronary artery without angina pectoris: Secondary | ICD-10-CM | POA: Insufficient documentation

## 2016-03-26 DIAGNOSIS — G43909 Migraine, unspecified, not intractable, without status migrainosus: Secondary | ICD-10-CM | POA: Diagnosis not present

## 2016-03-26 DIAGNOSIS — C946 Myelodysplastic disease, not classified: Secondary | ICD-10-CM | POA: Insufficient documentation

## 2016-03-26 DIAGNOSIS — Z8042 Family history of malignant neoplasm of prostate: Secondary | ICD-10-CM | POA: Insufficient documentation

## 2016-03-26 DIAGNOSIS — G4733 Obstructive sleep apnea (adult) (pediatric): Secondary | ICD-10-CM | POA: Insufficient documentation

## 2016-03-26 NOTE — Patient Instructions (Signed)
Follow up 4 months with Amy Clegg NP-C.  Do the following things EVERYDAY: 1) Weigh yourself in the morning before breakfast. Write it down and keep it in a log. 2) Take your medicines as prescribed 3) Eat low salt foods-Limit salt (sodium) to 2000 mg per day.  4) Stay as active as you can everyday 5) Limit all fluids for the day to less than 2 liters

## 2016-03-26 NOTE — Progress Notes (Addendum)
Patient ID: Juan [name], male   DOB: Aug 13, 1940, 75 y.o.   MRN: [id number]    Advanced Heart Failure Clinic Note   PCP: Dr Carles Collet Primary HF Cardiologist: Dr Haroldine Laws  Primary Cardiologist: Dr Johnsie Cancel   HPI: Juan [name] is a 75 y.o. male with history of Diastolic HF, OSA, HTN, DM2, and myeloproliferative disorder followed by Oncology.   He was seen by onc 10/22/14 for his myeloproliferative disorder. 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   Admitted to [facility] 01/2015 with increased dyspnea and volume overload. Diuresed with lasix and transitioned to lasix 40 mg three times a day.   He returns today for follow up. Complaining of a rash on his back and groin. Denies SOB/PND/Orthopnea. Walks with care. Taking all medications. His son helps with ADLs  as needed. Paramedicine following weekly. Requires assistance with transportation.   ECHO 12/24/2015: Ef 55-60%. Echo 01/21/15 EF 30%, moderate LVH, mild MR, PA peak pressure 59 mm Hg, Normal RV  Labs 01/29/2015: K 3.9 Creatinine 1.59  Labs 02/14/15: K 3.9 Creatinine 1.72  Labs 02/22/2015: K 4.1 Creatinine 1.82  Labs 05/23/2015: K 5.2 Creatinine 1.39   ROS: All systems negative except as listed in HPI, PMH and Problem List.  SH:  Social History   Social History  . Marital status: Widowed    Spouse name: N/A  . Number of children: 2  . Years of education: N/A   Occupational History  . retired    .  Retired   Social History Main Topics  . Smoking status: Former Smoker    Packs/day: 0.25    Years: 12.00    Types: Cigarettes    Quit date: 05/18/1966  . Smokeless tobacco: Never Used     Comment: quit smoking 45 years ago  . Alcohol use No  . Drug use: No  . Sexual activity: Yes   Other Topics Concern  . Not on file   Social History Narrative   Moved from [place] 2006   Widow 2007   Son lives with him     FH:  Family History  Problem  Relation Age of Onset  . Schizophrenia Son   . Colon cancer Neg Hx   . Prostate cancer Neg Hx   . Heart attack Neg Hx   . Diabetes Neg Hx     Past Medical History:  Diagnosis Date  . Allergic rhinitis   . Anemia   . Ascending aortic aneurysm ([place]) 03/2014   4.3cm on CT scan  . CAD (coronary artery disease)    dx elsewheer in past, no documentation. Non-ischemic myovue 2007  . CHF (congestive heart failure) (HCC)    dx elsewhere, no documentation. Normal EF by previous echo. Trival AS needs f/u ECHO by 2012  . Edema    R>L leg, u/s 5-12 neg for DVT  . Hemorrhoid   . History of thrombocytosis   . Hypertension   . Migraine    "once/wk at least" (07/11/2013)  . Myeloproliferative disease ([place])   . Shortness of breath   . Sinus congestion   . Sleep apnea, obstructive    at some point used CPAP, was d/c  years ago  . Type II diabetes mellitus ([place])     Current Outpatient Prescriptions  Medication Sig Dispense Refill  . acetaminophen (TYLENOL) 500 MG tablet Take 500 mg by mouth every 6 (six) hours as needed for  moderate pain or headache. Reported on 05/02/2015    . aspirin 325 MG tablet Take 1 tablet (325 mg total) by mouth daily.    . carvedilol (COREG) 6.25 MG tablet TAKE 1 TABLET BY MOUTH 2 TIMES DAILY. 60 tablet 2  . feeding supplement, GLUCERNA SHAKE, (GLUCERNA SHAKE) LIQD Take 237 mLs by mouth as needed (protein). Reported on 08/21/2015    . ferrous gluconate (FERGON) 225 (27 Fe) MG tablet Take 240 mg by mouth daily at 10 pm.    . fluticasone (FLONASE) 50 MCG/ACT nasal spray Place 2 sprays into both nostrils daily. Reported on 05/02/2015    . gabapentin (NEURONTIN) 100 MG capsule Take 100 mg by mouth 3 (three) times daily as needed (pain). Reported on 08/22/2015    . hydrALAZINE (APRESOLINE) 25 MG tablet TAKE 2 TABLETS BY MOUTH 3 TIMES DAILY. 180 tablet 3  . hydrocerin (EUCERIN) CREA Apply Eucerin cream to BLE Q day after bathing and roughly towel drying to remove loose skin 228  g 0  . HYDROcodone-acetaminophen (NORCO/VICODIN) 5-325 MG per tablet Take 1 tablet by mouth every 6 (six) hours as needed for moderate pain. 30 tablet 0  . isosorbide mononitrate (IMDUR) 30 MG 24 hr tablet TAKE 1 TABLET (30 MG TOTAL) BY MOUTH 2 (TWO) TIMES DAILY. 60 tablet 3  . simethicone (MYLICON) 80 MG chewable tablet Chew 80 mg by mouth every 6 (six) hours as needed for flatulence.    . torsemide (DEMADEX) 20 MG tablet Take 4 tablets (80 mg total) by mouth daily. 120 tablet 3  . vitamin B-12 (CYANOCOBALAMIN) 100 MCG tablet Take 100 mcg by mouth daily.     No current facility-administered medications for this encounter.     Vitals:   03/26/16 0940  BP: 138/78  Pulse: (!) 58  SpO2: 94%  Weight: 211 lb 6.4 oz (95.9 kg)   Wt Readings from Last 3 Encounters:  03/26/16 211 lb 6.4 oz (95.9 kg)  01/01/16 194 lb 7.1 oz (88.2 kg)  12/26/15 203 lb 8 oz (92.3 kg)     PHYSICAL EXAM: General:  Well appearing. NAD. In a wheel chair  HEENT: normal Neck: supple. JVP flat. Carotids 2+ bilaterally; no bruits. No thyromegaly or lymphadenopathy noted. Cor: PMI normal. RRR. No M/G/R Lungs: Clear, no difficulty Abdomen: soft, non-tender, non-distended, no HSM. No bruits or masses. +BS  Extremities: no cyanosis, clubbing,R and LLE  Chronic venous stasis changes.  No edema.  Neuro: alert & orientedx3, cranial nerves grossly intact. Moves all 4 extremities w/o difficulty. Affect pleasant   ASSESSMENT & PLAN: 1. Chronic Systolic CHF, EF 54% echo 01/21/15, Normal myoview 2007.  EF recovered 12/2015. ECHO EF 55-60%. NYHA II-III. Appears euvolemic. Continue torsemide 80 mg daily. Off sprio due to noncompliance.   - Continue coreg 6.25 mg twice a day.   - Continue hydralazine 50 mg tid and imdur 60 mg daily.  2. CKD stage III - Creatinine baseline 1.7-1.9.. Most recent creatinine 1.9  3. HTN-  Stable. Meds as above. 4. 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 - He has,  several times, refused further work up. He does not want further investigation or treatment 5. Rash- dry scales . I have asked him to follow up with PCP .  Followed by Paramedicine.   Follow up in 4  months  Nitasha Jewel  NP-C   03/26/16

## 2016-04-01 ENCOUNTER — Inpatient Hospital Stay (HOSPITAL_COMMUNITY): Payer: Medicare Other

## 2016-04-01 ENCOUNTER — Encounter (HOSPITAL_COMMUNITY): Payer: Self-pay | Admitting: Emergency Medicine

## 2016-04-01 ENCOUNTER — Inpatient Hospital Stay (HOSPITAL_COMMUNITY)
Admission: EM | Admit: 2016-04-01 | Discharge: 2016-04-04 | DRG: 194 | Disposition: A | Discharge status: Home Health | Payer: Medicare Other | Attending: Internal Medicine | Admitting: Internal Medicine

## 2016-04-01 ENCOUNTER — Emergency Department (HOSPITAL_COMMUNITY): Payer: Medicare Other

## 2016-04-01 DIAGNOSIS — I5023 Acute on chronic systolic (congestive) heart failure: Secondary | ICD-10-CM | POA: Diagnosis present

## 2016-04-01 DIAGNOSIS — Z79899 Other long term (current) drug therapy: Secondary | ICD-10-CM | POA: Diagnosis not present

## 2016-04-01 DIAGNOSIS — E1122 Type 2 diabetes mellitus with diabetic chronic kidney disease: Secondary | ICD-10-CM | POA: Diagnosis present

## 2016-04-01 DIAGNOSIS — G4733 Obstructive sleep apnea (adult) (pediatric): Secondary | ICD-10-CM | POA: Diagnosis present

## 2016-04-01 DIAGNOSIS — Z87891 Personal history of nicotine dependence: Secondary | ICD-10-CM | POA: Diagnosis not present

## 2016-04-01 DIAGNOSIS — C946 Myelodysplastic disease, not classified: Secondary | ICD-10-CM | POA: Diagnosis present

## 2016-04-01 DIAGNOSIS — I712 Thoracic aortic aneurysm, without rupture: Secondary | ICD-10-CM | POA: Diagnosis present

## 2016-04-01 DIAGNOSIS — M6281 Muscle weakness (generalized): Secondary | ICD-10-CM

## 2016-04-01 DIAGNOSIS — Z23 Encounter for immunization: Secondary | ICD-10-CM

## 2016-04-01 DIAGNOSIS — N183 Chronic kidney disease, stage 3 unspecified: Secondary | ICD-10-CM | POA: Diagnosis present

## 2016-04-01 DIAGNOSIS — R52 Pain, unspecified: Secondary | ICD-10-CM | POA: Diagnosis not present

## 2016-04-01 DIAGNOSIS — R05 Cough: Secondary | ICD-10-CM | POA: Diagnosis not present

## 2016-04-01 DIAGNOSIS — I130 Hypertensive heart and chronic kidney disease with heart failure and stage 1 through stage 4 chronic kidney disease, or unspecified chronic kidney disease: Secondary | ICD-10-CM | POA: Diagnosis present

## 2016-04-01 DIAGNOSIS — I5022 Chronic systolic (congestive) heart failure: Secondary | ICD-10-CM | POA: Diagnosis not present

## 2016-04-01 DIAGNOSIS — Z7982 Long term (current) use of aspirin: Secondary | ICD-10-CM

## 2016-04-01 DIAGNOSIS — J189 Pneumonia, unspecified organism: Principal | ICD-10-CM

## 2016-04-01 DIAGNOSIS — I5042 Chronic combined systolic (congestive) and diastolic (congestive) heart failure: Secondary | ICD-10-CM | POA: Diagnosis present

## 2016-04-01 DIAGNOSIS — I25118 Atherosclerotic heart disease of native coronary artery with other forms of angina pectoris: Secondary | ICD-10-CM | POA: Diagnosis not present

## 2016-04-01 DIAGNOSIS — D649 Anemia, unspecified: Secondary | ICD-10-CM | POA: Diagnosis present

## 2016-04-01 DIAGNOSIS — D7581 Myelofibrosis: Secondary | ICD-10-CM | POA: Diagnosis present

## 2016-04-01 DIAGNOSIS — J80 Acute respiratory distress syndrome: Secondary | ICD-10-CM | POA: Diagnosis not present

## 2016-04-01 DIAGNOSIS — I2510 Atherosclerotic heart disease of native coronary artery without angina pectoris: Secondary | ICD-10-CM | POA: Diagnosis present

## 2016-04-01 DIAGNOSIS — R030 Elevated blood-pressure reading, without diagnosis of hypertension: Secondary | ICD-10-CM | POA: Diagnosis not present

## 2016-04-01 DIAGNOSIS — R079 Chest pain, unspecified: Secondary | ICD-10-CM

## 2016-04-01 DIAGNOSIS — I10 Essential (primary) hypertension: Secondary | ICD-10-CM | POA: Diagnosis not present

## 2016-04-01 LAB — COMPREHENSIVE METABOLIC PANEL
ALBUMIN: 3 g/dL — AB (ref 3.5–5.0)
ALK PHOS: 78 U/L (ref 38–126)
ALT: 14 U/L — AB (ref 17–63)
ALT: 14 U/L — ABNORMAL LOW (ref 17–63)
ANION GAP: 9 (ref 5–15)
AST: 27 U/L (ref 15–41)
AST: 30 U/L (ref 15–41)
Albumin: 3.3 g/dL — ABNORMAL LOW (ref 3.5–5.0)
Alkaline Phosphatase: 81 U/L (ref 38–126)
Anion gap: 9 (ref 5–15)
BILIRUBIN TOTAL: 0.4 mg/dL (ref 0.3–1.2)
BUN: 59 mg/dL — ABNORMAL HIGH (ref 6–20)
BUN: 51 mg/dL — AB (ref 6–20)
CALCIUM: 9.5 mg/dL (ref 8.9–10.3)
CHLORIDE: 104 mmol/L (ref 101–111)
CO2: 23 mmol/L (ref 22–32)
CO2: 24 mmol/L (ref 22–32)
CREATININE: 1.66 mg/dL — AB (ref 0.61–1.24)
Calcium: 9.6 mg/dL (ref 8.9–10.3)
Chloride: 105 mmol/L (ref 101–111)
Creatinine, Ser: 1.6 mg/dL — ABNORMAL HIGH (ref 0.61–1.24)
GFR calc Af Amer: 47 mL/min — ABNORMAL LOW (ref >60)
GFR calc non Af Amer: 39 mL/min — ABNORMAL LOW (ref >60)
GFR calc non Af Amer: 41 mL/min — ABNORMAL LOW (ref >60)
GFR, EST AFRICAN AMERICAN: 45 mL/min — AB (ref >60)
GLUCOSE: 110 mg/dL — AB (ref 65–99)
Glucose, Bld: 104 mg/dL — ABNORMAL HIGH (ref 65–99)
POTASSIUM: 4.4 mmol/L (ref 3.5–5.1)
Potassium: 4 mmol/L (ref 3.5–5.1)
SODIUM: 136 mmol/L (ref 135–145)
SODIUM: 138 mmol/L (ref 135–145)
TOTAL PROTEIN: 5.9 g/dL — AB (ref 6.5–8.1)
Total Bilirubin: 0.5 mg/dL (ref 0.3–1.2)
Total Protein: 6.3 g/dL — ABNORMAL LOW (ref 6.5–8.1)

## 2016-04-01 LAB — EXPECTORATED SPUTUM ASSESSMENT W GRAM STAIN, RFLX TO RESP C

## 2016-04-01 LAB — CBC AND DIFFERENTIAL
HEMATOCRIT: 28 % — AB (ref 41–53)
Hemoglobin: 8.6 g/dL — AB (ref 13.5–17.5)
PLATELETS: 359 10*3/uL (ref 150–399)
WBC: 10.7 10*3/mL

## 2016-04-01 LAB — CBC WITH DIFFERENTIAL/PLATELET
BAND NEUTROPHILS: 0 %
BASOS ABS: 0.5 10*3/uL — AB (ref 0.0–0.1)
BASOS PCT: 4 %
BASOS PCT: 5 %
Basophils Absolute: 0.4 10*3/uL — ABNORMAL HIGH (ref 0.0–0.1)
Blasts: 0 %
EOS ABS: 0.3 10*3/uL (ref 0.0–0.7)
Eosinophils Absolute: 0.3 10*3/uL (ref 0.0–0.7)
Eosinophils Relative: 3 %
Eosinophils Relative: 3 %
HCT: 27.7 % — ABNORMAL LOW (ref 39.0–52.0)
HEMATOCRIT: 27.9 % — AB (ref 39.0–52.0)
Hemoglobin: 8.6 g/dL — ABNORMAL LOW (ref 13.0–17.0)
Hemoglobin: 8.7 g/dL — ABNORMAL LOW (ref 13.0–17.0)
LYMPHS ABS: 2.6 10*3/uL (ref 0.7–4.0)
LYMPHS PCT: 22 %
LYMPHS PCT: 27 %
Lymphs Abs: 2.4 10*3/uL (ref 0.7–4.0)
MCH: 24.9 pg — AB (ref 26.0–34.0)
MCH: 25.1 pg — AB (ref 26.0–34.0)
MCHC: 31 g/dL (ref 30.0–36.0)
MCHC: 31.2 g/dL (ref 30.0–36.0)
MCV: 80.3 fL (ref 78.0–100.0)
MCV: 80.6 fL (ref 78.0–100.0)
MONO ABS: 1.1 10*3/uL — AB (ref 0.1–1.0)
MONOS PCT: 14 %
MYELOCYTES: 0 %
Metamyelocytes Relative: 0 %
Monocytes Absolute: 1.3 10*3/uL — ABNORMAL HIGH (ref 0.1–1.0)
Monocytes Relative: 10 %
NEUTROS ABS: 4.9 10*3/uL (ref 1.7–7.7)
Neutro Abs: 6.5 10*3/uL (ref 1.7–7.7)
Neutrophils Relative %: 61 %
Neutrophils Relative %: 51 %
OTHER: 0 %
PLATELETS: 359 10*3/uL (ref 150–400)
PLATELETS: 344 10*3/uL (ref 150–400)
PROMYELOCYTES ABS: 0 %
RBC: 3.45 MIL/uL — ABNORMAL LOW (ref 4.22–5.81)
RBC: 3.46 MIL/uL — ABNORMAL LOW (ref 4.22–5.81)
RDW: 19.7 % — AB (ref 11.5–15.5)
RDW: 19.9 % — ABNORMAL HIGH (ref 11.5–15.5)
WBC MORPHOLOGY: INCREASED
WBC: 10.7 10*3/uL — ABNORMAL HIGH (ref 4.0–10.5)
WBC: 9.6 10*3/uL (ref 4.0–10.5)
nRBC: 3 /100 WBC — ABNORMAL HIGH

## 2016-04-01 LAB — HEPATIC FUNCTION PANEL
ALK PHOS: 78 U/L (ref 25–125)
ALT: 14 U/L (ref 10–40)
ALT: 14 U/L (ref 10–40)
AST: 30 U/L (ref 14–40)
AST: 27 U/L (ref 14–40)
Alkaline Phosphatase: 81 U/L (ref 25–125)
Bilirubin, Total: 0.5 mg/dL
Bilirubin, Total: 0.4 mg/dL

## 2016-04-01 LAB — URINE MICROSCOPIC-ADD ON

## 2016-04-01 LAB — EXPECTORATED SPUTUM ASSESSMENT W REFEX TO RESP CULTURE

## 2016-04-01 LAB — URINALYSIS, ROUTINE W REFLEX MICROSCOPIC
Bilirubin Urine: NEGATIVE
Glucose, UA: NEGATIVE mg/dL
Hgb urine dipstick: NEGATIVE
Ketones, ur: NEGATIVE mg/dL
LEUKOCYTES UA: NEGATIVE
NITRITE: NEGATIVE
PH: 6 (ref 5.0–8.0)
Protein, ur: 100 mg/dL — AB
SPECIFIC GRAVITY, URINE: 1.011 (ref 1.005–1.030)

## 2016-04-01 LAB — LACTIC ACID, PLASMA: Lactic Acid, Venous: 0.6 mmol/L (ref 0.5–1.9)

## 2016-04-01 LAB — I-STAT CG4 LACTIC ACID, ED
Lactic Acid, Venous: 0.35 mmol/L — ABNORMAL LOW (ref 0.5–1.9)
Lactic Acid, Venous: <0.3 mmol/L — ABNORMAL LOW (ref 0.5–1.9)

## 2016-04-01 LAB — GLUCOSE, CAPILLARY
GLUCOSE-CAPILLARY: 108 mg/dL — AB (ref 65–99)
GLUCOSE-CAPILLARY: 93 mg/dL (ref 65–99)
Glucose-Capillary: 112 mg/dL — ABNORMAL HIGH (ref 65–99)
Glucose-Capillary: 105 mg/dL — ABNORMAL HIGH (ref 65–99)

## 2016-04-01 LAB — TROPONIN I: Troponin I: <0.03 ng/mL (ref <0.03)

## 2016-04-01 LAB — INFLUENZA PANEL BY PCR (TYPE A & B)
INFLAPCR: NEGATIVE
INFLBPCR: NEGATIVE

## 2016-04-01 LAB — BASIC METABOLIC PANEL
BUN: 51 mg/dL — AB (ref 4–21)
CREATININE: 1.6 mg/dL — AB (ref 0.6–1.3)
Glucose: 110 mg/dL
Potassium: 4 mmol/L (ref 3.4–5.3)
SODIUM: 138 mmol/L (ref 137–147)

## 2016-04-01 LAB — STREP PNEUMONIAE URINARY ANTIGEN: STREP PNEUMO URINARY ANTIGEN: NEGATIVE

## 2016-04-01 LAB — BRAIN NATRIURETIC PEPTIDE: B NATRIURETIC PEPTIDE 5: 325.1 pg/mL — AB (ref 0.0–100.0)

## 2016-04-01 LAB — PATHOLOGIST SMEAR REVIEW

## 2016-04-01 MED ORDER — HYDRALAZINE HCL 25 MG PO TABS
25.0000 mg | ORAL_TABLET | Freq: Three times a day (TID) | ORAL | Status: DC
  Administered 2016-04-01 – 2016-04-03 (×9): 25 mg via ORAL
  Filled 2016-04-01 (×9): qty 1

## 2016-04-01 MED ORDER — ONDANSETRON HCL 4 MG/2ML IJ SOLN: 4.0000 mg | Freq: Four times a day (QID) | INTRAMUSCULAR | Status: DC | PRN

## 2016-04-01 MED ORDER — ACETAMINOPHEN 325 MG PO TABS
650.0000 mg | ORAL_TABLET | Freq: Once | ORAL | Status: AC
  Administered 2016-04-01: 650 mg via ORAL
  Filled 2016-04-01: qty 2

## 2016-04-01 MED ORDER — DEXTROSE 5 % IV SOLN
1.0000 g | INTRAVENOUS | Status: DC
  Administered 2016-04-02: 1 g via INTRAVENOUS
  Filled 2016-04-01: qty 10

## 2016-04-01 MED ORDER — NYSTATIN 100000 UNIT/GM EX CREA
TOPICAL_CREAM | Freq: Two times a day (BID) | CUTANEOUS | Status: DC
  Administered 2016-04-01 – 2016-04-02 (×3): via TOPICAL
  Administered 2016-04-03: 1 via TOPICAL
  Administered 2016-04-03: 10:00:00 via TOPICAL
  Filled 2016-04-01: qty 15

## 2016-04-01 MED ORDER — ACETAMINOPHEN 325 MG PO TABS
650.0000 mg | ORAL_TABLET | Freq: Four times a day (QID) | ORAL | Status: DC | PRN
  Administered 2016-04-01 – 2016-04-04 (×4): 650 mg via ORAL
  Filled 2016-04-01 (×4): qty 2

## 2016-04-01 MED ORDER — INSULIN ASPART 100 UNIT/ML ~~LOC~~ SOLN: 0.0000 [IU] | Freq: Three times a day (TID) | SUBCUTANEOUS | Status: DC

## 2016-04-01 MED ORDER — DEXTROSE 5 % IV SOLN
500.0000 mg | INTRAVENOUS | Status: DC
  Administered 2016-04-02: 500 mg via INTRAVENOUS
  Filled 2016-04-01: qty 500

## 2016-04-01 MED ORDER — VITAMIN B-12 100 MCG PO TABS
100.0000 ug | ORAL_TABLET | Freq: Every day | ORAL | Status: DC
  Administered 2016-04-01 – 2016-04-04 (×4): 100 ug via ORAL
  Filled 2016-04-01 (×5): qty 1

## 2016-04-01 MED ORDER — INFLUENZA VAC SPLIT QUAD 0.5 ML IM SUSY
0.5000 mL | PREFILLED_SYRINGE | INTRAMUSCULAR | Status: AC
  Administered 2016-04-04: 0.5 mL via INTRAMUSCULAR
  Filled 2016-04-01: qty 0.5

## 2016-04-01 MED ORDER — FLUTICASONE PROPIONATE 50 MCG/ACT NA SUSP
2.0000 | Freq: Every day | NASAL | Status: DC
  Administered 2016-04-01 – 2016-04-03 (×3): 2 via NASAL
  Filled 2016-04-01: qty 16

## 2016-04-01 MED ORDER — DEXTROSE 5 % IV SOLN
1.0000 g | Freq: Once | INTRAVENOUS | Status: AC
  Administered 2016-04-01: 1 g via INTRAVENOUS
  Filled 2016-04-01: qty 10

## 2016-04-01 MED ORDER — SODIUM CHLORIDE 0.9 % IV BOLUS (SEPSIS)
1000.0000 mL | Freq: Once | INTRAVENOUS | Status: AC
  Administered 2016-04-01: 1000 mL via INTRAVENOUS

## 2016-04-01 MED ORDER — ENOXAPARIN SODIUM 40 MG/0.4ML ~~LOC~~ SOLN
40.0000 mg | Freq: Every day | SUBCUTANEOUS | Status: DC
  Administered 2016-04-01 – 2016-04-04 (×4): 40 mg via SUBCUTANEOUS
  Filled 2016-04-01 (×4): qty 0.4

## 2016-04-01 MED ORDER — GABAPENTIN 100 MG PO CAPS
100.0000 mg | ORAL_CAPSULE | Freq: Three times a day (TID) | ORAL | Status: DC | PRN
  Administered 2016-04-03 (×2): 100 mg via ORAL
  Filled 2016-04-01 (×3): qty 1

## 2016-04-01 MED ORDER — ISOSORBIDE MONONITRATE ER 30 MG PO TB24
30.0000 mg | ORAL_TABLET | Freq: Two times a day (BID) | ORAL | Status: DC
  Administered 2016-04-01 – 2016-04-04 (×7): 30 mg via ORAL
  Filled 2016-04-01 (×7): qty 1

## 2016-04-01 MED ORDER — DEXTROSE 5 % IV SOLN
500.0000 mg | Freq: Once | INTRAVENOUS | Status: AC
  Administered 2016-04-01: 500 mg via INTRAVENOUS
  Filled 2016-04-01: qty 500

## 2016-04-01 MED ORDER — ACETAMINOPHEN 650 MG RE SUPP: 650.0000 mg | Freq: Four times a day (QID) | RECTAL | Status: DC | PRN

## 2016-04-01 MED ORDER — CARVEDILOL 6.25 MG PO TABS
6.2500 mg | ORAL_TABLET | Freq: Two times a day (BID) | ORAL | Status: DC
  Administered 2016-04-01 – 2016-04-04 (×7): 6.25 mg via ORAL
  Filled 2016-04-01 (×7): qty 1

## 2016-04-01 MED ORDER — FERROUS SULFATE 325 (65 FE) MG PO TABS
325.0000 mg | ORAL_TABLET | Freq: Every day | ORAL | Status: DC
  Administered 2016-04-01 – 2016-04-03 (×3): 325 mg via ORAL
  Filled 2016-04-01 (×3): qty 1

## 2016-04-01 MED ORDER — GLUCERNA SHAKE PO LIQD
237.0000 mL | Freq: Three times a day (TID) | ORAL | Status: DC
  Administered 2016-04-01 – 2016-04-02 (×3): 237 mL via ORAL

## 2016-04-01 MED ORDER — ASPIRIN 325 MG PO TABS
325.0000 mg | ORAL_TABLET | Freq: Every day | ORAL | Status: DC
  Administered 2016-04-01 – 2016-04-04 (×4): 325 mg via ORAL
  Filled 2016-04-01 (×5): qty 1

## 2016-04-01 MED ORDER — ONDANSETRON HCL 4 MG PO TABS: 4.0000 mg | ORAL_TABLET | Freq: Four times a day (QID) | ORAL | Status: DC | PRN

## 2016-04-01 MED ORDER — HYDROCODONE-ACETAMINOPHEN 5-325 MG PO TABS
1.0000 | ORAL_TABLET | Freq: Four times a day (QID) | ORAL | Status: DC | PRN
  Administered 2016-04-02 – 2016-04-04 (×6): 1 via ORAL
  Filled 2016-04-01 (×6): qty 1

## 2016-04-01 NOTE — ED Provider Notes (Signed)
[place] DEPT Provider Note   CSN: [id number] Arrival date & time: 04/01/16  0151   By signing my name below, I, Juan [name], attest that this documentation has been prepared under the direction and in the presence of Juan Hacker, MD  Electronically Signed: Delton [name], ED Scribe. 04/01/16. 2:41 AM.   History   Chief Complaint Chief Complaint  Patient presents with  . Fever   The history is provided by the patient. No language interpreter was used.   HPI Comments:  Juan [name] is a 75 y.o. male, with a hx of CAD, who presents to the Emergency Department with multiple complaints which began 2 days ago. Pt is complaining of aching back pain, chills, diaphoresis, weakness, myalgias, SOB and dysuria. He also notes a productive cough with green phlegm. He has taken tylenol with no relief. No exacerbating factors noted. Pt denies oxygen use at home, a hx of COPD and any other complaints or symptoms at this time. Denies fevers at home.  Past Medical History:  Diagnosis Date  . Allergic rhinitis   . Anemia   . Ascending aortic aneurysm ([place]) 03/2014   4.3cm on CT scan  . CAD (coronary artery disease)    dx elsewheer in past, no documentation. Non-ischemic myovue 2007  . CHF (congestive heart failure) (HCC)    dx elsewhere, no documentation. Normal EF by previous echo. Trival AS needs f/u ECHO by 2012  . Edema    R>L leg, u/s 5-12 neg for DVT  . Hemorrhoid   . History of thrombocytosis   . Hypertension   . Migraine    "once/wk at least" (07/11/2013)  . Myeloproliferative disease ([place])   . Shortness of breath   . Sinus congestion   . Sleep apnea, obstructive    at some point used CPAP, was d/c  years ago  . Type II diabetes mellitus [facility])     Patient Active Problem List   Diagnosis Date Noted  . Pneumonia 04/01/2016  . Pressure ulcer stage II 12/31/2015  . UTI (urinary tract infection) 12/30/2015  . Sepsis ([place]) 12/29/2015  . UTI (lower urinary tract infection)  12/29/2015  . T wave inversion in EKG 12/29/2015  . PCP NOTES >>>>>> 05/02/2015  . Chronic systolic heart failure ([place]) 02/14/2015  . CKD (chronic kidney disease), stage III 01/22/2015  . Pain in limb   . Peripheral edema   . Unable to ambulate   . Thoracic aortic aneurysm ([place]) 04/26/2014  . CKD (chronic kidney disease) stage 3, GFR 30-59 ml/min 04/26/2014  . 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 Chest pain 04/15/2014  . Palliative care encounter 04/06/2014  . Weakness generalized 04/06/2014  . Venous stasis dermatitis of both lower extremities   . Malnutrition of moderate degree ([place]) 03/31/2014  . Morbid obesity due to excess calories ([place]) 03/29/2014  . 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 Leukocytosis 10/17/2013  . Poor compliance with advise  05/05/2012  . Annual physical exam 01/14/2012  . Weight loss 01/14/2012  . BPH (benign prostatic hyperplasia) 01/14/2012  . *Chronic lower extremity edema, stasis dermatitis 06/09/2011  . CARDIOMEGALY 04/29/2009  . *Prediabetes 09/07/2006  . Symptomatic anemia 09/07/2006  . Myeloproliferative disease ([place]) 09/07/2006  . Obstructive sleep apnea 09/07/2006  . Essential hypertension 09/07/2006  . Coronary atherosclerosis 09/07/2006  . *CHF-- PCP comments 09/07/2006  . ALLERGIC RHINITIS 09/07/2006    Past Surgical History:  Procedure Laterality Date  . TOE SURGERY Right    "tried to  straighten out big toe" (07/11/2013)       Home Medications    Prior to Admission medications   Medication Sig Start Date End Date Taking? Authorizing Provider  acetaminophen (TYLENOL) 500 MG tablet Take 500 mg by mouth every 6 (six) hours as needed for moderate pain or headache. Reported on 05/02/2015    Historical Provider, MD  aspirin 325 MG tablet Take 1 tablet (325 mg total) by mouth daily. 04/16/14   Ripudeep Krystal Eaton, MD  carvedilol (COREG) 6.25 MG tablet TAKE 1 TABLET BY MOUTH 2 TIMES DAILY. 03/10/16   Shirley Friar, PA-C  feeding supplement, GLUCERNA SHAKE, (GLUCERNA SHAKE) LIQD Take 237 mLs by mouth as needed (protein). Reported on 08/21/2015    Historical Provider, MD  ferrous gluconate (FERGON) 225 (27 Fe) MG tablet Take 240 mg by mouth daily at 10 pm.    Historical Provider, MD  fluticasone (FLONASE) 50 MCG/ACT nasal spray Place 2 sprays into both nostrils daily. Reported on 05/02/2015    Historical Provider, MD  gabapentin (NEURONTIN) 100 MG capsule Take 100 mg by mouth 3 (three) times daily as needed (pain). Reported on 08/22/2015    Historical Provider, MD  hydrALAZINE (APRESOLINE) 25 MG tablet TAKE 2 TABLETS BY MOUTH 3 TIMES DAILY. 12/03/15   Amy D Ninfa Meeker, NP  hydrocerin (EUCERIN) CREA Apply Eucerin cream to BLE Q day after bathing and roughly towel drying to remove loose skin 01/27/14   Jonetta Osgood, MD  HYDROcodone-acetaminophen (NORCO/VICODIN) 5-325 MG per tablet Take 1 tablet by mouth every 6 (six) hours as needed for moderate pain. 10/18/14   Colon Branch, MD  isosorbide mononitrate (IMDUR) 30 MG 24 hr tablet TAKE 1 TABLET (30 MG TOTAL) BY MOUTH 2 (TWO) TIMES DAILY. 03/10/16   Amy D Ninfa Meeker, NP  simethicone (MYLICON) 80 MG chewable tablet Chew 80 mg by mouth every 6 (six) hours as needed for flatulence.    Historical Provider, MD  torsemide (DEMADEX) 20 MG tablet Take 4 tablets (80 mg total) by mouth daily. 03/14/15   Jolaine Artist, MD  vitamin B-12 (CYANOCOBALAMIN) 100 MCG tablet Take 100 mcg by mouth daily.    Historical Provider, MD    Family History Family History  Problem Relation Age of Onset  . Schizophrenia Son   . Colon cancer Neg Hx   . Prostate cancer Neg Hx   . Heart attack Neg Hx   . Diabetes Neg Hx     Social History Social History  Substance Use Topics  . Smoking status: Former Smoker    Packs/day: 0.25    Years: 12.00    Types: Cigarettes    Quit date: 05/18/1966  . Smokeless tobacco: Never Used     Comment: quit smoking 45 years ago  . Alcohol use No      Allergies   Patient has no known allergies.   Review of Systems Review of Systems  Constitutional: Positive for chills and diaphoresis.  Respiratory: Positive for cough and shortness of breath.   Cardiovascular: Negative for chest pain.  Gastrointestinal: Negative for abdominal pain, nausea and vomiting.  Genitourinary: Positive for dysuria.  Musculoskeletal: Positive for back pain and myalgias.  Neurological: Positive for weakness.  All other systems reviewed and are negative.    Physical Exam Updated Vital Signs BP 140/73   Pulse 81   Temp 100.2 F (37.9 C) (Rectal)   Resp 20   Ht '6\' 3"'$  (1.905 m)   Wt 205 lb (93 kg)  SpO2 93%   BMI 25.62 kg/m   Physical Exam  Constitutional: He is oriented to person, place, and time. He appears well-developed and well-nourished.  HENT:  Head: Normocephalic and atraumatic.  Cardiovascular: Regular rhythm and normal heart sounds.   No murmur heard. Tachycardia  Pulmonary/Chest: Effort normal and breath sounds normal. No respiratory distress. He has no wheezes.  Nasal cannula in place  Abdominal: Soft. Bowel sounds are normal. There is no tenderness. There is no rebound.  Musculoskeletal: He exhibits edema.  Neurological: He is alert and oriented to person, place, and time.  Skin: Skin is warm and dry.  Psychiatric: He has a normal mood and affect.  Nursing note and vitals reviewed.    ED Treatments / Results  DIAGNOSTIC STUDIES:  Oxygen Saturation is 88% on RA, low by my interpretation.    COORDINATION OF CARE:  2:21 AM Discussed treatment plan with pt at bedside and pt agreed to plan.  Labs (all labs ordered are listed, but only abnormal results are displayed) Labs Reviewed  CBC WITH DIFFERENTIAL/PLATELET - Abnormal; Notable for the following:       Result Value   WBC 10.7 (*)    RBC 3.45 (*)    Hemoglobin 8.6 (*)    HCT 27.7 (*)    MCH 24.9 (*)    RDW 19.7 (*)    nRBC 3 (*)    Monocytes Absolute 1.1 (*)     Basophils Absolute 0.4 (*)    All other components within normal limits  COMPREHENSIVE METABOLIC PANEL - Abnormal; Notable for the following:    Glucose, Bld 104 (*)    BUN 59 (*)    Creatinine, Ser 1.66 (*)    Total Protein 6.3 (*)    Albumin 3.3 (*)    ALT 14 (*)    GFR calc non Af Amer 39 (*)    GFR calc Af Amer 45 (*)    All other components within normal limits  URINALYSIS, ROUTINE W REFLEX MICROSCOPIC (NOT AT [facility]) - Abnormal; Notable for the following:    Protein, ur 100 (*)    All other components within normal limits  URINE MICROSCOPIC-ADD ON - Abnormal; Notable for the following:    Squamous Epithelial / LPF 0-5 (*)    Bacteria, UA RARE (*)    Casts HYALINE CASTS (*)    All other components within normal limits  I-STAT CG4 LACTIC ACID, ED - Abnormal; Notable for the following:    Lactic Acid, Venous 0.35 (*)    All other components within normal limits  URINE CULTURE  CULTURE, BLOOD (ROUTINE X 2)  CULTURE, BLOOD (ROUTINE X 2)  BRAIN NATRIURETIC PEPTIDE    EKG  EKG Interpretation None       Radiology Dg Chest 2 View  Result Date: 04/01/2016 CLINICAL DATA:  Acute onset of fever, cough and body aches. Initial encounter. EXAM: CHEST  2 VIEW COMPARISON:  Chest radiograph performed 12/29/2015 FINDINGS: The lungs are well-aerated. Vascular congestion is noted. Bilateral central airspace opacities may reflect pneumonia or pulmonary edema. No pleural effusion or pneumothorax is seen. The heart is mildly enlarged. No acute osseous abnormalities are seen. IMPRESSION: Vascular congestion and mild cardiomegaly. Bilateral central airspace opacities may reflect pneumonia or pulmonary edema. Electronically Signed   By: Garald Balding M.D.   On: 04/01/2016 03:08    Procedures Procedures (including critical care time)  Medications Ordered in ED Medications  cefTRIAXone (ROCEPHIN) 1 g in dextrose 5 % 50 mL IVPB (  not administered)  azithromycin (ZITHROMAX) 500 mg in dextrose  5 % 250 mL IVPB (not administered)  sodium chloride 0.9 % bolus 1,000 mL (not administered)  acetaminophen (TYLENOL) tablet 650 mg (not administered)     Initial Impression / Assessment and Plan / ED Course  I have reviewed the triage vital signs and the nursing notes.  Pertinent labs & imaging results that were available during my care of the patient were reviewed by me and considered in my medical decision making (see chart for details).  Clinical Course     Patient presents with myalgias, cough, new O2 requirement. No acute distress. Nontoxic. Temperature 100.2. Lactate normal. Blood pressure reassuring. He does have a leukocytosis. Chest x-ray concerning for edema versus developing pneumonia. Clinically given fever and myalgias, suspect pneumonia. Patient was given Rocephin and azithromycin. Given oxygen requirement, will admit to the hospital.  Final Clinical Impressions(s) / ED Diagnoses   Final diagnoses:  Community acquired pneumonia, unspecified laterality    New Prescriptions New Prescriptions   No medications on file   I personally performed the services described in this documentation, which was scribed in my presence. The recorded information has been reviewed and is accurate.     Juan Hacker, MD 04/01/16 [phone]

## 2016-04-01 NOTE — H&P (Signed)
History and Physical    Arturo Sofranko OFB:[id number] DOB: [birth year] DOA: 04/01/2016  PCP: [name], MD  Patient coming from: Home.  Chief Complaint: Weakness and cough.  HPI: Mays Paino is a 75 y.o. male with CAD, diabetes mellitus, CHF presents to the ER because of weakness. Patient also has been having pain involving the upper and lower back with productive cough over the last 2 days. Since yesterday patient found very weak to ambulate and called his son and was brought to the ER. In the ER patient was found to be febrile with chest x-ray showing congestion and possible pneumonia. Patient also is mildly tachycardic which patient was given fluid bolus following which patient's heart rate improved. Patient is being admitted for community-acquired pneumonia. Patient's upper back pain increases on deep inspiration and coughing. On exam patient is not in distress.  ED Course: Was started on empiric antibiotics for community-acquired pneumonia.  Review of Systems: As per HPI, rest all negative.   Past Medical History:  Diagnosis Date  . Allergic rhinitis   . Anemia   . Ascending aortic aneurysm ([place]) 03/2014   4.3cm on CT scan  . CAD (coronary artery disease)    dx elsewheer in past, no documentation. Non-ischemic myovue 2007  . CHF (congestive heart failure) (HCC)    dx elsewhere, no documentation. Normal EF by previous echo. Trival AS needs f/u ECHO by 2012  . Edema    R>L leg, u/s 5-12 neg for DVT  . Hemorrhoid   . History of thrombocytosis   . Hypertension   . Migraine    "once/wk at least" (07/11/2013)  . Myeloproliferative disease ([place])   . Shortness of breath   . Sinus congestion   . Sleep apnea, obstructive    at some point used CPAP, was d/c  years ago  . Type II diabetes mellitus ([place])     Past Surgical History:  Procedure Laterality Date  . TOE SURGERY Right    "tried to straighten out big toe" (07/11/2013)     reports that he quit smoking about 49 years ago. His  smoking use included Cigarettes. He has a 3.00 pack-year smoking history. He has never used smokeless tobacco. He reports that he does not drink alcohol or use drugs.  No Known Allergies  Family History  Problem Relation Age of Onset  . Schizophrenia Son   . Colon cancer Neg Hx   . Prostate cancer Neg Hx   . Heart attack Neg Hx   . Diabetes Neg Hx     Prior to Admission medications   Medication Sig Start Date End Date Taking? Authorizing Provider  aspirin 325 MG tablet Take 1 tablet (325 mg total) by mouth daily. 04/16/14  Yes Ripudeep K Rai, MD  carvedilol (COREG) 6.25 MG tablet TAKE 1 TABLET BY MOUTH 2 TIMES DAILY. 03/10/16  Yes Shirley Friar, PA-C  hydrALAZINE (APRESOLINE) 25 MG tablet TAKE 2 TABLETS BY MOUTH 3 TIMES DAILY. 12/03/15  Yes Amy D Clegg, NP  isosorbide mononitrate (IMDUR) 30 MG 24 hr tablet TAKE 1 TABLET (30 MG TOTAL) BY MOUTH 2 (TWO) TIMES DAILY. 03/10/16  Yes Amy D Clegg, NP  acetaminophen (TYLENOL) 500 MG tablet Take 500 mg by mouth every 6 (six) hours as needed for moderate pain or headache. Reported on 05/02/2015    Historical Provider, MD  feeding supplement, GLUCERNA SHAKE, (GLUCERNA SHAKE) LIQD Take 237 mLs by mouth as needed (protein). Reported on 08/21/2015    Historical Provider, MD  ferrous gluconate (FERGON) 225 (27 Fe) MG tablet Take 240 mg by mouth daily at 10 pm.    Historical Provider, MD  fluticasone (FLONASE) 50 MCG/ACT nasal spray Place 2 sprays into both nostrils daily. Reported on 05/02/2015    Historical Provider, MD  gabapentin (NEURONTIN) 100 MG capsule Take 100 mg by mouth 3 (three) times daily as needed (pain). Reported on 08/22/2015    Historical Provider, MD  hydrocerin (EUCERIN) CREA Apply Eucerin cream to BLE Q day after bathing and roughly towel drying to remove loose skin 01/27/14   Jonetta Osgood, MD  HYDROcodone-acetaminophen (NORCO/VICODIN) 5-325 MG per tablet Take 1 tablet by mouth every 6 (six) hours as needed for moderate pain.  10/18/14   Colon Branch, MD  simethicone (MYLICON) 80 MG chewable tablet Chew 80 mg by mouth every 6 (six) hours as needed for flatulence.    Historical Provider, MD  torsemide (DEMADEX) 20 MG tablet Take 4 tablets (80 mg total) by mouth daily. 03/14/15   Jolaine Artist, MD  vitamin B-12 (CYANOCOBALAMIN) 100 MCG tablet Take 100 mcg by mouth daily.    Historical Provider, MD    Physical Exam: Vitals:   04/01/16 0245 04/01/16 0314 04/01/16 0345 04/01/16 0522  BP: 140/73   129/80  Pulse: 82  81 79  Resp: '23  20 19  '$ Temp:  100.2 F (37.9 C)    TempSrc:  Rectal    SpO2:   93% 93%  Weight:      Height:          Constitutional: Moderately built and nourished. Vitals:   04/01/16 0245 04/01/16 0314 04/01/16 0345 04/01/16 0522  BP: 140/73   129/80  Pulse: 82  81 79  Resp: '23  20 19  '$ Temp:  100.2 F (37.9 C)    TempSrc:  Rectal    SpO2:   93% 93%  Weight:      Height:       Eyes: Anicteric no pallor. ENMT: No discharge from the ears eyes nose or mouth. Neck: No mass felt. No neck rigidity. Respiratory: No rhonchi or crepitations. Cardiovascular: S1 and S2 heard. No murmurs appreciated. Abdomen: Soft nontender bowel sounds present. No guarding or rigidity. Musculoskeletal: No edema. No joint effusion. Skin: No rash. Has some itching skin around the groin area. Neurologic: Alert awake oriented to time place and person. Moves all extremities. Psychiatric: Appears normal. Normal affect.   Labs on Admission: I have personally reviewed following labs and imaging studies  CBC:  Recent Labs Lab 04/01/16 0247  WBC 10.7*  NEUTROABS 6.5  HGB 8.6*  HCT 27.7*  MCV 80.3  PLT 270   Basic Metabolic Panel:  Recent Labs Lab 04/01/16 0247  NA 136  K 4.4  CL 104  CO2 23  GLUCOSE 104*  BUN 59*  CREATININE 1.66*  CALCIUM 9.6   GFR: Estimated Creatinine Clearance: 46.7 mL/min (by C-G formula based on SCr of 1.66 mg/dL (H)). Liver Function Tests:  Recent Labs Lab  04/01/16 0247  AST 27  ALT 14*  ALKPHOS 81  BILITOT 0.5  PROT 6.3*  ALBUMIN 3.3*   No results for input(s): LIPASE, AMYLASE in the last 168 hours. No results for input(s): AMMONIA in the last 168 hours. Coagulation Profile: No results for input(s): INR, PROTIME in the last 168 hours. Cardiac Enzymes: No results for input(s): CKTOTAL, CKMB, CKMBINDEX, TROPONINI in the last 168 hours. BNP (last 3 results) No results for input(s): PROBNP in the last  8760 hours. HbA1C: No results for input(s): HGBA1C in the last 72 hours. CBG: No results for input(s): GLUCAP in the last 168 hours. Lipid Profile: No results for input(s): CHOL, HDL, LDLCALC, TRIG, CHOLHDL, LDLDIRECT in the last 72 hours. Thyroid Function Tests: No results for input(s): TSH, T4TOTAL, FREET4, T3FREE, THYROIDAB in the last 72 hours. Anemia Panel: No results for input(s): VITAMINB12, FOLATE, FERRITIN, TIBC, IRON, RETICCTPCT in the last 72 hours. Urine analysis:    Component Value Date/Time   COLORURINE YELLOW 04/01/2016 0240   APPEARANCEUR CLEAR 04/01/2016 0240   LABSPEC 1.011 04/01/2016 0240   PHURINE 6.0 04/01/2016 0240   GLUCOSEU NEGATIVE 04/01/2016 0240   GLUCOSEU NEGATIVE 05/02/2015 1008   HGBUR NEGATIVE 04/01/2016 0240   BILIRUBINUR NEGATIVE 04/01/2016 0240   KETONESUR NEGATIVE 04/01/2016 0240   PROTEINUR 100 (A) 04/01/2016 0240   UROBILINOGEN 0.2 05/02/2015 1008   NITRITE NEGATIVE 04/01/2016 0240   LEUKOCYTESUR NEGATIVE 04/01/2016 0240   Sepsis Labs: '@LABRCNTIP'$ (procalcitonin:4,lacticidven:4) )No results found for this or any previous visit (from the past 240 hour(s)).   Radiological Exams on Admission: Dg Chest 2 View  Result Date: 04/01/2016 CLINICAL DATA:  Acute onset of fever, cough and body aches. Initial encounter. EXAM: CHEST  2 VIEW COMPARISON:  Chest radiograph performed 12/29/2015 FINDINGS: The lungs are well-aerated. Vascular congestion is noted. Bilateral central airspace opacities may  reflect pneumonia or pulmonary edema. No pleural effusion or pneumothorax is seen. The heart is mildly enlarged. No acute osseous abnormalities are seen. IMPRESSION: Vascular congestion and mild cardiomegaly. Bilateral central airspace opacities may reflect pneumonia or pulmonary edema. Electronically Signed   By: Garald Balding M.D.   On: 04/01/2016 03:08     Assessment/Plan Active Problems:   Pneumonia    1. Community acquired pneumonia - given the pleuritic type of upper back pain and productive cough we will treat this as commended for pneumonia with ceftriaxone and Zithromax. Check urine for Legionella and strep antigen and influenza PCR. 2. Diastolic CHF last EF measured in August 2017 was 55-60% with grade 1 diastolic dysfunction - since patient was tachycardic in the ER patient was given fluid bolus. At this time we are holding off patient's torsemide. Restart from tomorrow if stable. 3. CAD - cycle cardiac markers. Continue Coreg and aspirin. 4. Hypertension on Coreg and hydralazine and Imdur. 5. Chronic anemia - follow CBC. 6. Diabetes mellitus type 2 on diet - I have placed patient on sliding scale coverage. 7. Myeloproliferative disorder - patient had declined further treatment. 8. OSA - on CPAP. 9. Chronic kidney disease stage III creatinine around 1.4 closely follow metabolic panel.  I have ordered CT chest without contrast to check for any abnormalities in the aortic region as history mentioned ascending aortic aneurysm. Cannot give contrast due to renal failure.   DVT prophylaxis: Lovenox. Code Status: Full code.  Family Communication: Discussed with patient.  Disposition Plan: Home.  Consults called: None.  Admission status: Inpatient.    Rise Patience MD [facility] Pager [phone].  If 7PM-7AM, please contact night-coverage [email or web address] Password [facility]  04/01/2016, 6:36 AM

## 2016-04-01 NOTE — Progress Notes (Signed)
Pt reports pain in the left leg medial  knee area. During physician rounding the patient voiced this pain to the physician.    Homans test negative.  Skin feels warm and dry. No swelling upon assessment of site. Pain addressed with medication see MAR.  Will continue to monitor   Paulla Fore, RN

## 2016-04-01 NOTE — Progress Notes (Signed)
[facility] PROGRESS NOTE  Juan [name] PZW:[id number] DOB: 08-Apr-1941 DOA: 04/01/2016  PCP: [name], MD  Brief History/Interval Summary: 75 year old African-American male with a past medical history of coronary artery disease, diabetes, diastolic and systolic CHF , presented with complaints of weakness, cough, and upper and lower back pain ongoing for the last few days. Patient was found to be febrile with x-ray suggesting possible pneumonia. Patient was hospitalized for further management of community-acquired pneumonia.  Reason for Visit: Community-acquired pneumonia  Consultants: None  Procedures: None yet  Antibiotics: Ceftriaxone and azithromycin  Subjective/Interval History: Patient feels well overall. Continues to have cough. Continues to have generalized body aches.  ROS: He denies any nausea or vomiting  Objective:  Vital Signs  Vitals:   04/01/16 0522 04/01/16 0649 04/01/16 0651 04/01/16 0811  BP: 129/80  (!) 155/77 135/68  Pulse: 79  78   Resp: 19  (!) 21   Temp:   98.8 F (37.1 C) 99.2 F (37.3 C)  TempSrc:    Oral  SpO2: 93%  93%   Weight:      Height:  '6\' 3"'$  (1.905 m)      Intake/Output Summary (Last 24 hours) at 04/01/16 1155 Last data filed at 04/01/16 0901  Gross per 24 hour  Intake              240 ml  Output              400 ml  Net             -160 ml   Filed Weights   04/01/16 0207  Weight: 93 kg (205 lb)    General appearance: alert, cooperative, appears stated age and no distress Resp: Crackles heard bilateral bases. Few scattered wheezes. No rhonchi. Cardio: regular rate and rhythm, S1, S2 normal, no murmur, click, rub or gallop GI: soft, non-tender; bowel sounds normal; no masses,  no organomegaly Pulses: 2+ and symmetric Neurologic: Awake and alert. Oriented 3. No focal neurological deficits.  Lab Results:  Data Reviewed: I have personally reviewed following labs and imaging studies  CBC:  Recent Labs Lab  04/01/16 0247 04/01/16 0826  WBC 10.7* 9.6  NEUTROABS 6.5 4.9  HGB 8.6* 8.7*  HCT 27.7* 27.9*  MCV 80.3 80.6  PLT 359 423    Basic Metabolic Panel:  Recent Labs Lab 04/01/16 0247 04/01/16 0826  NA 136 138  K 4.4 4.0  CL 104 105  CO2 23 24  GLUCOSE 104* 110*  BUN 59* 51*  CREATININE 1.66* 1.60*  CALCIUM 9.6 9.5    GFR: Estimated Creatinine Clearance: 48.4 mL/min (by C-G formula based on SCr of 1.6 mg/dL (H)).  Liver Function Tests:  Recent Labs Lab 04/01/16 0247 04/01/16 0826  AST 27 30  ALT 14* 14*  ALKPHOS 81 78  BILITOT 0.5 0.4  PROT 6.3* 5.9*  ALBUMIN 3.3* 3.0*    Cardiac Enzymes:  Recent Labs Lab 04/01/16 0826  TROPONINI <0.03    CBG:  Recent Labs Lab 04/01/16 0806  GLUCAP 108*     Radiology Studies: Dg Chest 2 View  Result Date: 04/01/2016 CLINICAL DATA:  Acute onset of fever, cough and body aches. Initial encounter. EXAM: CHEST  2 VIEW COMPARISON:  Chest radiograph performed 12/29/2015 FINDINGS: The lungs are well-aerated. Vascular congestion is noted. Bilateral central airspace opacities may reflect pneumonia or pulmonary edema. No pleural effusion or pneumothorax is seen. The heart is mildly enlarged. No acute osseous abnormalities are seen. IMPRESSION: Vascular  congestion and mild cardiomegaly. Bilateral central airspace opacities may reflect pneumonia or pulmonary edema. Electronically Signed   By: Garald Balding M.D.   On: 04/01/2016 03:08   Ct Chest Wo Contrast  Result Date: 04/01/2016 CLINICAL DATA:  Chest pain. Evaluate for aortic aneurysm. History of myeloproliferative disorder. EXAM: CT CHEST WITHOUT CONTRAST TECHNIQUE: Multidetector CT imaging of the chest was performed following the standard protocol without IV contrast. COMPARISON:  06/21/2014 FINDINGS: Cardiovascular: Ascending thoracic aorta measures up to 4.4 cm and stable. Atherosclerotic calcifications involving the posterior aortic arch. Proximal descending thoracic aorta  measures 3.8 cm and stable. Mid descending thoracic aorta measures 3.4 cm and stable. Main pulmonary artery is markedly enlarged measuring 5 cm but stable. Mediastinum/Nodes: Small mediastinal lymph nodes have not significantly changed. There is a 1 cm hypodense nodule or cyst in the right thyroid lobe. There is subcutaneous edema in the chest. No significant pericardial fluid. Lungs/Pleura: Trachea and mainstem bronchi are patent. Again noted is a mosaic attenuation pattern throughout the lungs. This pattern probably represents multiple areas of air trapping. These are prominent pulmonary vessels in the lung bases. Volume loss in the right lower lobe related to elevation of the right hemidiaphragm. There is some septal thickening in the upper lobes. No significant pleural effusions. Upper Abdomen: Again noted is an enlarged spleen which is only partially visualized. No acute abnormality in the upper abdomen. There is a high-density structure within the stomach lumen. Musculoskeletal: The visualized bones are diffusely sclerotic which is similar to the previous examination. There are small scattered lucent areas throughout the bones. Some of these lucent areas/lesions have enlarged from the prior examination. Lucent lesion along the posterior left sixth rib now measures 1.1 cm and previously measured 0.7 cm. No evidence for pathologic fracture. 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 Recommend annual imaging followup by CTA or MRA. This recommendation follows 2010 ACCF/AHA/AATS/ACR/ASA/SCA/SCAI/SIR/STS/SVM Guidelines for the Diagnosis and Management of Patients with Thoracic Aortic Disease. Circulation. 2010; 121: L892-J194 Stable enlargement of pulmonary arteries compatible with pulmonary hypertension. Mosaic attenuation pattern throughout both lungs. Similar finding was present on the previous examination. This is probably related to a combination of air trapping and  mild edema. Bones are diffusely sclerotic with enlarged lucent lesions. 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 Splenomegaly. This is probably related to the history of myeloproliferative disorder. Electronically Signed   By: Markus Daft M.D.   On: 04/01/2016 11:14     Medications:  Scheduled: . aspirin  325 mg Oral Daily  . [START ON 04/02/2016] azithromycin  500 mg Intravenous Q24H  . carvedilol  6.25 mg Oral BID WC  . [START ON 04/02/2016] cefTRIAXone (ROCEPHIN)  IV  1 g Intravenous Q24H  . enoxaparin (LOVENOX) injection  40 mg Subcutaneous Daily  . ferrous sulfate  325 mg Oral Q2200  . fluticasone  2 spray Each Nare Daily  . hydrALAZINE  25 mg Oral Q8H  . [START ON 04/02/2016] Influenza vac split quadrivalent PF  0.5 mL Intramuscular Tomorrow-1000  . insulin aspart  0-9 Units Subcutaneous TID WC  . isosorbide mononitrate  30 mg Oral BID WC  . nystatin cream   Topical BID  . vitamin B-12  100 mcg Oral Daily   Continuous:  RDE:YCXKGYJEHUDJS **OR** acetaminophen, gabapentin, HYDROcodone-acetaminophen, ondansetron **OR** ondansetron (ZOFRAN) IV  Assessment/Plan:  Principal Problem:   Community acquired pneumonia Active Problems:   Myeloproliferative disease ([place])   Obstructive sleep apnea   Essential  hypertension   Coronary atherosclerosis   CKD (chronic kidney disease) stage 3, GFR 30-59 ml/min   Chronic systolic heart failure (H[facility])   Pneumonia   Pressure injury of skin     Community acquired pneumonia Continue with ceftriaxone and azithromycin. Wait for clinical improvement. Influenza PCR was negative. Follow-up on blood cultures. CT scan of the chest was ordered by admitting physician. Report has been reviewed. Stable aortic aneurysm was noted. Continue to monitor patient for now.   History of Diastolic CHF  Last EF measured in August 2017 was 55-60% with grade 1 diastolic dysfunction. Since patient was tachycardic in the ER patient was given fluid  bolus. At this time we are holding off patient's torsemide. Restart from tomorrow if stable.  CAD Continue Coreg and aspirin. Currently stable.  Essential Hypertension Continue Coreg and hydralazine and Imdur.  Chronic anemia Hemoglobin appears to be stable. Continue to monitor.  Diabetes mellitus type 2 Apparently diet controlled. Continue sliding scale insulin coverage. Monitor CBGs.   Myeloproliferative disorder Patient had declined further treatment. Bony sclerotic lesions also noted on CT scan, which has been seen previously as well. Outpatient follow-up with PCP.  OSA on CPAP.  Chronic kidney disease stage III Continue to monitor renal function closely. Appears to be close to baseline.   DVT Prophylaxis: Lovenox    Code Status: Full code  Family Communication: Discussed with the patient  Disposition Plan: Management as outlined above. PT and OT evaluation starting tomorrow.    LOS: 0 days   [facility] Pager [phone] 04/01/2016, 11:55 AM  If 7PM-7AM, please contact night-coverage at [email or web address], password [facility]

## 2016-04-01 NOTE — Progress Notes (Signed)
PT Cancellation Note  Patient Details Name: Grayland Daisey MRN: [id number] DOB: [birth year]   Cancelled Treatment:    Reason Eval/Treat Not Completed: Patient declined, no reason specified.  I'm too sore to do anything today, I might be able to tomorrow11/15/2017  Ken Montray Kliebert, PT [phone]  (pager).    Tessie Fass Cande Mastropietro 04/01/2016, 12:27 PM

## 2016-04-01 NOTE — ED Triage Notes (Signed)
Per EMS, pt from home with c/o fever, cough, body aches x 2 days. Pt states he took an "old" antibiotic without relief. EMS temp-98.1, BP-140/82, HR-88, RR-18

## 2016-04-01 NOTE — Progress Notes (Signed)
Initial Nutrition Assessment  DOCUMENTATION CODES:   Not applicable  INTERVENTION:  Provide Glucerna Shake po TID, each supplement provides 220 kcal and 10 grams of protein.  Encourage adequate PO intake.   NUTRITION DIAGNOSIS:   Increased nutrient needs related to chronic illness as evidenced by estimated needs.  GOAL:   Patient will meet greater than or equal to 90% of their needs  MONITOR:   PO intake, Supplement acceptance, Labs, Weight trends, Skin, I & O's  REASON FOR ASSESSMENT:   Malnutrition Screening Tool    ASSESSMENT:   75 year old African-American male with a past medical history of coronary artery disease, diabetes, diastolic and systolic CHF , presented with complaints of weakness, cough, and upper and lower back pain ongoing for the last few days. Patient was found to be febrile with x-ray suggesting possible pneumonia. Patient was hospitalized for further management of community-acquired pneumonia.  Meal completion has been 75%. Pt reports having a good appetite currently and PTA with usual consumption of at least 3 meals a day with an Glucerna shake on occasion. Pt reports no changes in weight. Per weight records, weight has been fluctuating likely due to fluid status. RD to order Glucerna Shake to aid in caloric and protein needs as well as in wound healing.  Nutrition-Focused physical exam completed. Findings are no fat depletion, moderate muscle depletion, and moderate edema.   Labs and medications reviewed.   Diet Order:  Diet heart healthy/carb modified Room service appropriate? Yes; Fluid consistency: Thin  Skin:  Wound (see comment) (Stage 2 on buttocks)  Last BM:  Unknown  Height:   Ht Readings from Last 1 Encounters:  04/01/16 '6\' 3"'$  (1.905 m)    Weight:   Wt Readings from Last 1 Encounters:  04/01/16 205 lb (93 kg)    Ideal Body Weight:  89.09 kg  BMI:  Body mass index is 25.62 kg/m.  Estimated Nutritional Needs:   Kcal:   1601-0932  Protein:  115-130 grams  Fluid:  Per MD  EDUCATION NEEDS:   No education needs identified at this time  Corrin Parker, MS, RD, LDN Pager # [phone] After hours/ weekend pager # [phone]

## 2016-04-02 DIAGNOSIS — N183 Chronic kidney disease, stage 3 (moderate): Secondary | ICD-10-CM

## 2016-04-02 DIAGNOSIS — I10 Essential (primary) hypertension: Secondary | ICD-10-CM

## 2016-04-02 DIAGNOSIS — I25118 Atherosclerotic heart disease of native coronary artery with other forms of angina pectoris: Secondary | ICD-10-CM

## 2016-04-02 LAB — CBC
HCT: 28 % — ABNORMAL LOW (ref 39.0–52.0)
HEMOGLOBIN: 8.7 g/dL — AB (ref 13.0–17.0)
MCH: 24.9 pg — AB (ref 26.0–34.0)
MCHC: 31.1 g/dL (ref 30.0–36.0)
MCV: 80.2 fL (ref 78.0–100.0)
PLATELETS: 373 10*3/uL (ref 150–400)
RBC: 3.49 MIL/uL — AB (ref 4.22–5.81)
RDW: 19.8 % — ABNORMAL HIGH (ref 11.5–15.5)
WBC: 10.7 10*3/uL — ABNORMAL HIGH (ref 4.0–10.5)

## 2016-04-02 LAB — URINE CULTURE: Culture: NO GROWTH

## 2016-04-02 LAB — CBC AND DIFFERENTIAL
HCT: 28 % — AB (ref 41–53)
Hemoglobin: 8.7 g/dL — AB (ref 13.5–17.5)
Platelets: 373 10*3/uL (ref 150–399)
WBC: 10.7 10*3/mL

## 2016-04-02 LAB — BASIC METABOLIC PANEL
Anion gap: 7 (ref 5–15)
BUN: 46 mg/dL — AB (ref 4–21)
BUN: 46 mg/dL — ABNORMAL HIGH (ref 6–20)
CALCIUM: 9.9 mg/dL (ref 8.9–10.3)
CHLORIDE: 107 mmol/L (ref 101–111)
CO2: 23 mmol/L (ref 22–32)
CREATININE: 1.5 mg/dL — AB (ref 0.6–1.3)
CREATININE: 1.48 mg/dL — AB (ref 0.61–1.24)
GFR calc non Af Amer: 45 mL/min — ABNORMAL LOW (ref >60)
GFR, EST AFRICAN AMERICAN: 52 mL/min — AB (ref >60)
GLUCOSE: 102 mg/dL
GLUCOSE: 102 mg/dL — AB (ref 65–99)
POTASSIUM: 4.2 mmol/L (ref 3.4–5.3)
Potassium: 4.2 mmol/L (ref 3.5–5.1)
SODIUM: 137 mmol/L (ref 137–147)
Sodium: 137 mmol/L (ref 135–145)

## 2016-04-02 LAB — GLUCOSE, CAPILLARY
Glucose-Capillary: 97 mg/dL (ref 65–99)
Glucose-Capillary: 109 mg/dL — ABNORMAL HIGH (ref 65–99)
Glucose-Capillary: 99 mg/dL (ref 65–99)
Glucose-Capillary: 103 mg/dL — ABNORMAL HIGH (ref 65–99)

## 2016-04-02 LAB — LEGIONELLA PNEUMOPHILA SEROGP 1 UR AG: L. PNEUMOPHILA SEROGP 1 UR AG: NEGATIVE

## 2016-04-02 MED ORDER — LEVOFLOXACIN 750 MG PO TABS
750.0000 mg | ORAL_TABLET | Freq: Every day | ORAL | Status: DC
  Administered 2016-04-03 – 2016-04-04 (×2): 750 mg via ORAL
  Filled 2016-04-02 (×3): qty 1

## 2016-04-02 NOTE — Progress Notes (Signed)
OT Cancellation Note  Patient Details Name: Juan [name] MRN: [id number] DOB: 1940-09-12   Cancelled Treatment:    Reason Eval/Treat Not Completed: Fatigue/lethargy limiting ability to participate. Pt reports he "just finished working with therapy" and is too tired. Will reattempt as able. Pt stated, "ok, but I'm not sure I'll be up to doing much later either."  Hortencia Pilar 04/02/2016, 11:09 AM

## 2016-04-02 NOTE — Progress Notes (Signed)
Patient refuses CPAP for the night. RT will continue to monitor.  

## 2016-04-02 NOTE — Consult Note (Signed)
   [facility] CM Inpatient Consult   04/02/2016  Hridhaan Locatelli 01/14/1941 [id number]   Referral received for services.  Met with inpatient RNCM that the patient states he has someone coming out for community follow up.  Chart review reveals patient is possibly being followed by [facility] with [facility].  Met with patient to explain services and to inquire about his community follow up needs but he states, "I am in too much back pain right now to talk about that."  Did leave a brochure with contact information.  Chart reviewed further and found the patient has been active with [facility].  Call and spoke with Fran at [phone] and confirmed that the patient is still active in their program. This writer will sign off for now as the patient has the community follow up needed at this time.  Questions, please contact:   , RN BSN CCM [facility] Liaison  [phone] business mobile phone Toll free office [phone]   

## 2016-04-02 NOTE — Progress Notes (Signed)
unfortunately respiratory equipment not available per CPAP order. All NIV units are in use.

## 2016-04-02 NOTE — Care Management Note (Signed)
Case Management Note  Patient Details  Name: Juan [name] MRN: [id number] Date of Birth: June 04, 1940  Subjective/Objective:    CM following for progression and d/c planning.                 Action/Plan: 04/02/2016 Met with pt re [place] needs, pt states he has had HH and has a nurse now who visits q2weeks. Pt unsure of names of agencies. This CM noted on last admission this pt was setup with [facility] for [facility] services and appears to be active with [facility].  [facility] notified of plan for [facility] and HHPT. They will review record to determine if pt is currently active and if not will start as a new episode.  Wait [facility] response.  Pt has DME, walker, wheelchair, tub seat and BSC.  Pt reluctant to participate in PT or planning. This CM explained to pt that he will not qualify to stay in the hospital for "soreness". Encouraged participation with PT and HHPT. Will arrange this ,however, pt may decline HHPT as he currently states that he does not do PT at home.   Expected Discharge Date:    04/03/2016              Expected Discharge Plan:  [place]  In-House Referral:  NA  Discharge planning Services  CM Consult  Post Acute Care Choice:  Home Health Choice offered to:  Patient  DME Arranged:  N/A DME Agency:  NA  HH Arranged:  RN, PT [place] Agency:  [place]  Status of Service:  In process, will continue to follow  If discussed at Long Length of Stay Meetings, dates discussed:    Additional Comments:  Adron Bene, RN 04/02/2016, 10:16 AM

## 2016-04-02 NOTE — Progress Notes (Signed)
[facility] PROGRESS NOTE  Arshdeep Bolger RSW:[id number] DOB: 24-Oct-1940 DOA: 04/01/2016  PCP: [name], MD  Brief History/Interval Summary: 75 year old African-American male with a past medical history of coronary artery disease, diabetes, diastolic and systolic CHF , presented with complaints of weakness, cough, and upper and lower back pain ongoing for the last few days. Patient was found to be febrile with x-ray suggesting possible pneumonia. Patient was hospitalized for further management of community-acquired pneumonia.  Reason for Visit: Community-acquired pneumonia  Consultants: None  Procedures: None yet  Antibiotics: Ceftriaxone and azithromycin will be changed to Levaquin today  Subjective/Interval History: Patient feels better. Continues to have pain all over his body, mainly in his upper back, lower back and his legs. Cough has improved. Denies any chest pain. Denies any difficulty breathing.   ROS: He denies any nausea or vomiting  Objective:  Vital Signs  Vitals:   04/01/16 1813 04/01/16 2130 04/02/16 0641 04/02/16 0800  BP: 132/61 128/66 134/68 135/68  Pulse: 75 75 84 84  Resp: '17 16 16 16  '$ Temp: 98.3 F (36.8 C) 99.3 F (37.4 C) 98.5 F (36.9 C) 99.3 F (37.4 C)  TempSrc: Oral Oral Oral Oral  SpO2: 99% 95% 93% 94%  Weight:  93 kg (205 lb 0.4 oz)    Height:        Intake/Output Summary (Last 24 hours) at 04/02/16 1200 Last data filed at 04/02/16 1000  Gross per 24 hour  Intake             1260 ml  Output             1175 ml  Net               85 ml   Filed Weights   04/01/16 0207 04/01/16 2130  Weight: 93 kg (205 lb) 93 kg (205 lb 0.4 oz)    General appearance: alert, cooperative, appears stated age and no distress Resp: Improved air entry bilaterally. No wheezing today. No rhonchi. Few crackles at the bases.  Cardio: regular rate and rhythm, S1, S2 normal, no murmur, click, rub or gallop GI: soft, non-tender; bowel sounds normal; no  masses,  no organomegaly Pulses: 2+ and symmetric No deformity noted over either of his lower legs. He has full range of motion of both his knee joints. No swelling. No calf tenderness. No deformity noted over the back. Neurologic: Awake and alert. Oriented 3. No focal neurological deficits.  Lab Results:  Data Reviewed: I have personally reviewed following labs and imaging studies  CBC:  Recent Labs Lab 04/01/16 0247 04/01/16 0826 04/02/16 0717  WBC 10.7* 9.6 10.7*  NEUTROABS 6.5 4.9  --   HGB 8.6* 8.7* 8.7*  HCT 27.7* 27.9* 28.0*  MCV 80.3 80.6 80.2  PLT 359 344 093    Basic Metabolic Panel:  Recent Labs Lab 04/01/16 0247 04/01/16 0826 04/02/16 0717  NA 136 138 137  K 4.4 4.0 4.2  CL 104 105 107  CO2 '23 24 23  '$ GLUCOSE 104* 110* 102*  BUN 59* 51* 46*  CREATININE 1.66* 1.60* 1.48*  CALCIUM 9.6 9.5 9.9    GFR: Estimated Creatinine Clearance: 52.3 mL/min (by C-G formula based on SCr of 1.48 mg/dL (H)).  Liver Function Tests:  Recent Labs Lab 04/01/16 0247 04/01/16 0826  AST 27 30  ALT 14* 14*  ALKPHOS 81 78  BILITOT 0.5 0.4  PROT 6.3* 5.9*  ALBUMIN 3.3* 3.0*    Cardiac Enzymes:  Recent Labs  Lab 04/01/16 0826 04/01/16 1359 04/01/16 2048  TROPONINI <0.03 <0.03 <0.03    CBG:  Recent Labs Lab 04/01/16 0806 04/01/16 1143 04/01/16 1620 04/01/16 2129 04/02/16 0750  GLUCAP 108* 112* 93 105* 97     Radiology Studies: Dg Chest 2 View  Result Date: 04/01/2016 CLINICAL DATA:  Acute onset of fever, cough and body aches. Initial encounter. EXAM: CHEST  2 VIEW COMPARISON:  Chest radiograph performed 12/29/2015 FINDINGS: The lungs are well-aerated. Vascular congestion is noted. Bilateral central airspace opacities may reflect pneumonia or pulmonary edema. No pleural effusion or pneumothorax is seen. The heart is mildly enlarged. No acute osseous abnormalities are seen. IMPRESSION: Vascular congestion and mild cardiomegaly. Bilateral central airspace  opacities may reflect pneumonia or pulmonary edema. Electronically Signed   By: Garald Balding M.D.   On: 04/01/2016 03:08   Ct Chest Wo Contrast  Result Date: 04/01/2016 CLINICAL DATA:  Chest pain. Evaluate for aortic aneurysm. History of myeloproliferative disorder. EXAM: CT CHEST WITHOUT CONTRAST TECHNIQUE: Multidetector CT imaging of the chest was performed following the standard protocol without IV contrast. COMPARISON:  06/21/2014 FINDINGS: Cardiovascular: Ascending thoracic aorta measures up to 4.4 cm and stable. Atherosclerotic calcifications involving the posterior aortic arch. Proximal descending thoracic aorta measures 3.8 cm and stable. Mid descending thoracic aorta measures 3.4 cm and stable. Main pulmonary artery is markedly enlarged measuring 5 cm but stable. Mediastinum/Nodes: Small mediastinal lymph nodes have not significantly changed. There is a 1 cm hypodense nodule or cyst in the right thyroid lobe. There is subcutaneous edema in the chest. No significant pericardial fluid. Lungs/Pleura: Trachea and mainstem bronchi are patent. Again noted is a mosaic attenuation pattern throughout the lungs. This pattern probably represents multiple areas of air trapping. These are prominent pulmonary vessels in the lung bases. Volume loss in the right lower lobe related to elevation of the right hemidiaphragm. There is some septal thickening in the upper lobes. No significant pleural effusions. Upper Abdomen: Again noted is an enlarged spleen which is only partially visualized. No acute abnormality in the upper abdomen. There is a high-density structure within the stomach lumen. Musculoskeletal: The visualized bones are diffusely sclerotic which is similar to the previous examination. There are small scattered lucent areas throughout the bones. Some of these lucent areas/lesions have enlarged from the prior examination. Lucent lesion along the posterior left sixth rib now measures 1.1 cm and previously  measured 0.7 cm. No evidence for pathologic fracture. 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 Recommend annual imaging followup by CTA or MRA. This recommendation follows 2010 ACCF/AHA/AATS/ACR/ASA/SCA/SCAI/SIR/STS/SVM Guidelines for the Diagnosis and Management of Patients with Thoracic Aortic Disease. Circulation. 2010; 121: N629-B284 Stable enlargement of pulmonary arteries compatible with pulmonary hypertension. Mosaic attenuation pattern throughout both lungs. Similar finding was present on the previous examination. This is probably related to a combination of air trapping and mild edema. Bones are diffusely sclerotic with enlarged lucent lesions. 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 Splenomegaly. This is probably related to the history of myeloproliferative disorder. Electronically Signed   By: Markus Daft M.D.   On: 04/01/2016 11:14     Medications:  Scheduled: . aspirin  325 mg Oral Daily  . azithromycin  500 mg Intravenous Q24H  . carvedilol  6.25 mg Oral BID WC  . cefTRIAXone (ROCEPHIN)  IV  1 g Intravenous Q24H  . enoxaparin (LOVENOX) injection  40 mg Subcutaneous Daily  . feeding supplement (GLUCERNA SHAKE)  237 mL  Oral TID BM  . ferrous sulfate  325 mg Oral Q2200  . fluticasone  2 spray Each Nare Daily  . hydrALAZINE  25 mg Oral Q8H  . Influenza vac split quadrivalent PF  0.5 mL Intramuscular Tomorrow-1000  . insulin aspart  0-9 Units Subcutaneous TID WC  . isosorbide mononitrate  30 mg Oral BID WC  . nystatin cream   Topical BID  . vitamin B-12  100 mcg Oral Daily   Continuous:  VKF:[id number] **OR** acetaminophen, gabapentin, HYDROcodone-acetaminophen, ondansetron **OR** ondansetron (ZOFRAN) IV  Assessment/Plan:  Principal Problem:   Community acquired pneumonia Active Problems:   Myeloproliferative disease ([place])   Obstructive sleep apnea   Essential hypertension   Coronary atherosclerosis   CKD  (chronic kidney disease) stage 3, GFR 30-59 ml/min   Chronic systolic heart failure (HCC)   Pneumonia   Pressure injury of skin     Community acquired pneumonia Patient appears to be clinically improving. He appears to be more comfortable though remains anxious. Okay at this time to change him to oral antibiotics. We will initiate Levaquin. Influenza PCR was negative. Strep pneumonia antigen was negative. Blood cultures are negative so far. Sputum culture is still pending. CT scan of the chest was ordered by admitting physician. Report has been reviewed. Stable aortic aneurysm was noted. Continue to monitor patient for now.   Body aches Most likely secondary to his acute illness. No history of falls or other trauma. Nothing specific seen on examination.  History of Diastolic CHF  Last EF measured in August 2017 was 55-60% with grade 1 diastolic dysfunction. Since patient was tachycardic in the ER patient was given fluid bolus. Patient's torsemide was held at the time of admission. Consider resuming at time of discharge, which could be as early as tomorrow.  CAD Continue Coreg and aspirin. Currently stable. Troponins were negative.  Essential Hypertension Continue Coreg and hydralazine and Imdur.  Chronic anemia Hemoglobin appears to be stable. Continue to monitor.  Diabetes mellitus type 2 Apparently diet controlled. Continue sliding scale insulin coverage. Monitor CBGs.   Myeloproliferative disorder Patient had declined further treatment. Bony sclerotic lesions also noted on CT scan, which has been seen previously as well. Outpatient follow-up with PCP.  OSA on CPAP.  Chronic kidney disease stage III Continue to monitor renal function closely. Appears to be close to baseline.   DVT Prophylaxis: Lovenox    Code Status: Full code  Family Communication: Discussed with the patient  Disposition Plan: Management as outlined above. PT and OT evaluation. Change to oral  antibiotics today.    LOS: 1 day   [facility] Pager [phone] 04/02/2016, 12:00 PM  If 7PM-7AM, please contact night-coverage at [email or web address], password [facility]

## 2016-04-03 DIAGNOSIS — G4733 Obstructive sleep apnea (adult) (pediatric): Secondary | ICD-10-CM

## 2016-04-03 DIAGNOSIS — D471 Chronic myeloproliferative disease: Secondary | ICD-10-CM

## 2016-04-03 LAB — CULTURE, RESPIRATORY W GRAM STAIN: Gram Stain: NONE SEEN

## 2016-04-03 LAB — CBC
HEMATOCRIT: 26 % — AB (ref 39.0–52.0)
Hemoglobin: 8.1 g/dL — ABNORMAL LOW (ref 13.0–17.0)
MCH: 25.2 pg — ABNORMAL LOW (ref 26.0–34.0)
MCHC: 31.2 g/dL (ref 30.0–36.0)
MCV: 80.7 fL (ref 78.0–100.0)
PLATELETS: 338 10*3/uL (ref 150–400)
RBC: 3.22 MIL/uL — AB (ref 4.22–5.81)
RDW: 20 % — ABNORMAL HIGH (ref 11.5–15.5)
WBC: 9.5 10*3/uL (ref 4.0–10.5)

## 2016-04-03 LAB — CBC AND DIFFERENTIAL
HCT: 26 % — AB (ref 41–53)
Hemoglobin: 8.1 g/dL — AB (ref 13.5–17.5)
Platelets: 338 10*3/uL (ref 150–399)
WBC: 9.5 10^3/mL

## 2016-04-03 LAB — BASIC METABOLIC PANEL
ANION GAP: 8 (ref 5–15)
BUN: 45 mg/dL — ABNORMAL HIGH (ref 6–20)
BUN: 45 mg/dL — AB (ref 4–21)
CO2: 23 mmol/L (ref 22–32)
CREATININE: 1.4 mg/dL — AB (ref 0.6–1.3)
Calcium: 9.5 mg/dL (ref 8.9–10.3)
Chloride: 105 mmol/L (ref 101–111)
Creatinine, Ser: 1.35 mg/dL — ABNORMAL HIGH (ref 0.61–1.24)
GFR, EST AFRICAN AMERICAN: 58 mL/min — AB (ref >60)
GFR, EST NON AFRICAN AMERICAN: 50 mL/min — AB (ref >60)
GLUCOSE: 125 mg/dL — AB (ref 65–99)
Glucose: 125 mg/dL
POTASSIUM: 4 mmol/L (ref 3.4–5.3)
POTASSIUM: 4 mmol/L (ref 3.5–5.1)
SODIUM: 136 mmol/L — AB (ref 137–147)
Sodium: 136 mmol/L (ref 135–145)

## 2016-04-03 LAB — CULTURE, RESPIRATORY: CULTURE: NORMAL

## 2016-04-03 LAB — GLUCOSE, CAPILLARY
GLUCOSE-CAPILLARY: 123 mg/dL — AB (ref 65–99)
GLUCOSE-CAPILLARY: 126 mg/dL — AB (ref 65–99)
GLUCOSE-CAPILLARY: 116 mg/dL — AB (ref 65–99)
Glucose-Capillary: 109 mg/dL — ABNORMAL HIGH (ref 65–99)

## 2016-04-03 MED ORDER — FUROSEMIDE 10 MG/ML IJ SOLN
40.0000 mg | Freq: Once | INTRAMUSCULAR | Status: AC
  Administered 2016-04-03: 40 mg via INTRAVENOUS
  Filled 2016-04-03: qty 4

## 2016-04-03 NOTE — Progress Notes (Signed)
Please see previous NCM notes. Waiting to see if pt will need oxygen for home. Lives at home with son, Kaden Daughdrill. Pt also has hospital bed. Pt states his dtr or grandson will take him to his appt. Will need qualifying oxygen sats.   PCP  PAZ, Alda Berthold MD

## 2016-04-03 NOTE — Progress Notes (Signed)
Patient refused CPAP tonight.Explained to Patient that if they changed their mind, to just have the RN call Respiratory and we would come set them up.

## 2016-04-03 NOTE — Care Management Important Message (Signed)
Important Message  Patient Details  Name: Juan [name] MRN: [id number] Date of Birth: March 01, 1941   Medicare Important Message Given:  Yes    Erenest Rasher, RN 04/03/2016, 11:27 AM

## 2016-04-03 NOTE — Evaluation (Signed)
Occupational Therapy Evaluation Patient Details Name: Juan [name] MRN: [id number] DOB: 1940/11/24 Today's Date: 04/03/2016    History of Present Illness Juan [name] is a 75 y.o. male with CAD, diabetes mellitus, CHF presents to the ER because of weakness.  Treatment for PNA   Clinical Impression   Pt with decline in function and safety with ADLs and ADL mobility with decreased strength, balance and activity tolerance. Pt limited by reports on back pain and required encouragement to participate. Pt would benefit from acute OT services to address impairments to increase level of function and safety O2 SATs on RA at rest 96% O2 SATs on RA with activity 91%    Follow Up Recommendations  Home health OT    Equipment Recommendations  None recommended by OT;Tub/shower bench    Recommendations for Other Services       Precautions / Restrictions Precautions Precautions: Fall Restrictions Weight Bearing Restrictions: No      Mobility Bed Mobility               General bed mobility comments: pt up in recliner  Transfers Overall transfer level: Needs assistance Equipment used: Rolling walker (2 wheeled) Transfers: Sit to/from Stand Sit to Stand: Min assist         General transfer comment: cues for hand placement, lift and stability assist    Balance Overall balance assessment: Needs assistance   Sitting balance-Leahy Scale: Fair       Standing balance-Leahy Scale: Poor                              ADL Overall ADL's : Needs assistance/impaired     Grooming: Wash/dry hands;Wash/dry face;Standing;Min guard   Upper Body Bathing: Set up   Lower Body Bathing: Moderate assistance       Lower Body Dressing: Set up;Moderate assistance   Toilet Transfer: Minimal assistance;BSC;RW;Ambulation   Toileting- Clothing Manipulation and Hygiene: Minimal assistance       Functional mobility during ADLs: Rolling walker;Minimal assistance General ADL  Comments: educated on DME for shower safety, however pt recluctant and stated that he will "wash up real good at sink at home"     Vision Vision Assessment?: No apparent visual deficits              Pertinent Vitals/Pain Pain Assessment: 0-10 Pain Score: 6  Pain Location: back Pain Descriptors / Indicators: Sore Pain Intervention(s): Limited activity within patient's tolerance;Monitored during session;Repositioned     Hand Dominance Right   Extremity/Trunk Assessment Upper Extremity Assessment Upper Extremity Assessment: Generalized weakness           Communication Communication Communication: No difficulties   Cognition Arousal/Alertness: Awake/alert Behavior During Therapy: WFL for tasks assessed/performed Overall Cognitive Status: Within Functional Limits for tasks assessed                     General Comments   pt pleasant, requires encouragement                 Home Living Family/patient expects to be discharged to:: Private residence Living Arrangements: Children Available Help at Discharge: Family;Available 24 hours/day Type of Home: Apartment Home Access: Level entry     Home Layout: One level     Bathroom Shower/Tub: Teacher, early years/pre: Standard     Home Equipment: Environmental consultant - 2 wheels;Cane - single point;Shower seat;Bedside commode;Wheelchair - manual   Additional Comments: pt lives  with his son      Prior Functioning/Environment Level of Independence: Needs assistance  Gait / Transfers Assistance Needed: pt ambulated with RW ADL's / Homemaking Assistance Needed: Pt's son performs housekeeping and cooking. Pt states he is able to bathe at sink, dress and toilet himself   Comments: unclear if pt's report of PLOF is accurate        OT Problem List: Impaired balance (sitting and/or standing);Pain;Decreased activity tolerance;Decreased knowledge of use of DME or AE   OT Treatment/Interventions: Self-care/ADL  training;DME and/or AE instruction;Therapeutic activities;Patient/family education    OT Goals(Current goals can be found in the care plan section) Acute Rehab OT Goals Patient Stated Goal: get better and go home OT Goal Formulation: With patient Time For Goal Achievement: 04/10/16 Potential to Achieve Goals: Good ADL Goals Pt Will Perform Grooming: with supervision;with set-up;standing Pt Will Perform Lower Body Bathing: with min assist Pt Will Perform Lower Body Dressing: with min assist Pt Will Transfer to Toilet: with min guard assist;regular height toilet;ambulating;grab bars Pt Will Perform Toileting - Clothing Manipulation and hygiene: with min guard assist;sit to/from stand  OT Frequency: Min 2X/week   Barriers to D/C:    no barriers                     End of Session Equipment Utilized During Treatment: Gait belt;Rolling walker;Other (comment) (BSC)  Activity Tolerance: Patient limited by pain Patient left: in chair;with call Juan [name]/phone within reach   Time: 1121-1159 OT Time Calculation (min): 38 min Charges:  OT General Charges $OT Visit: 1 Procedure OT Evaluation $OT Eval Moderate Complexity: 1 Procedure OT Treatments $Self Care/Home Management : 8-22 mins $Therapeutic Activity: 8-22 mins G-Codes:    Juan [name] 04/03/2016, 1:07 PM

## 2016-04-03 NOTE — Progress Notes (Signed)
[facility] PROGRESS NOTE  Juan [name] NKN:[id number] DOB: 07-Feb-1941 DOA: 04/01/2016  PCP: [name], MD  Subjective/Interval History: Still complaining about shortness of breath and left-sided chest pain. I feel very weak,  Brief History/Interval Summary: 75 year old African-American male with a past medical history of coronary artery disease, diabetes, diastolic and systolic CHF , presented with complaints of weakness, cough, and upper and lower back pain ongoing for the last few days. Patient was found to be febrile with x-ray suggesting possible pneumonia. Patient was hospitalized for further management of community-acquired pneumonia.  Reason for Visit: Community-acquired pneumonia  Consultants: None  Procedures: None yet  Antibiotics: Ceftriaxone and azithromycin will be changed to Levaquin today  ROS: He denies any nausea or vomiting  Objective:  Vital Signs  Vitals:   04/02/16 1700 04/02/16 2145 04/03/16 0637 04/03/16 1000  BP: 136/61 125/65 127/64 136/69  Pulse: 71 79 86 89  Resp: '16 16 16 18  '$ Temp: 99.8 F (37.7 C) 98.3 F (36.8 C) 97.5 F (36.4 C) 98.1 F (36.7 C)  TempSrc: Oral Oral Oral Oral  SpO2: 96% 98% 93% 96%  Weight:      Height:        Intake/Output Summary (Last 24 hours) at 04/03/16 1214 Last data filed at 04/03/16 0900  Gross per 24 hour  Intake             1200 ml  Output              570 ml  Net              630 ml   Filed Weights   04/01/16 0207 04/01/16 2130  Weight: 93 kg (205 lb) 93 kg (205 lb 0.4 oz)    General appearance: alert, cooperative, appears stated age and no distress Resp: Improved air entry bilaterally. No wheezing today. No rhonchi. Few crackles at the bases.  Cardio: regular rate and rhythm, S1, S2 normal, no murmur, click, rub or gallop GI: soft, non-tender; bowel sounds normal; no masses,  no organomegaly Pulses: 2+ and symmetric No deformity noted over either of his lower legs. He has full range of  motion of both his knee joints. No swelling. No calf tenderness. No deformity noted over the back. Neurologic: Awake and alert. Oriented 3. No focal neurological deficits.  Lab Results:  Data Reviewed: I have personally reviewed following labs and imaging studies  CBC:  Recent Labs Lab 04/01/16 0247 04/01/16 0826 04/02/16 0717 04/03/16 0504  WBC 10.7* 9.6 10.7* 9.5  NEUTROABS 6.5 4.9  --   --   HGB 8.6* 8.7* 8.7* 8.1*  HCT 27.7* 27.9* 28.0* 26.0*  MCV 80.3 80.6 80.2 80.7  PLT 359 344 373 379    Basic Metabolic Panel:  Recent Labs Lab 04/01/16 0247 04/01/16 0826 04/02/16 0717 04/03/16 0504  NA 136 138 137 136  K 4.4 4.0 4.2 4.0  CL 104 105 107 105  CO2 '23 24 23 23  '$ GLUCOSE 104* 110* 102* 125*  BUN 59* 51* 46* 45*  CREATININE 1.66* 1.60* 1.48* 1.35*  CALCIUM 9.6 9.5 9.9 9.5    GFR: Estimated Creatinine Clearance: 57.4 mL/min (by C-G formula based on SCr of 1.35 mg/dL (H)).  Liver Function Tests:  Recent Labs Lab 04/01/16 0247 04/01/16 0826  AST 27 30  ALT 14* 14*  ALKPHOS 81 78  BILITOT 0.5 0.4  PROT 6.3* 5.9*  ALBUMIN 3.3* 3.0*    Cardiac Enzymes:  Recent Labs Lab 04/01/16 0826 04/01/16  1359 04/01/16 2048  TROPONINI <0.03 <0.03 <0.03    CBG:  Recent Labs Lab 04/02/16 1215 04/02/16 1706 04/02/16 2144 04/03/16 0738 04/03/16 1151  GLUCAP 109* 99 103* 109* 123*     Radiology Studies: No results found.   Medications:  Scheduled: . aspirin  325 mg Oral Daily  . carvedilol  6.25 mg Oral BID WC  . enoxaparin (LOVENOX) injection  40 mg Subcutaneous Daily  . feeding supplement (GLUCERNA SHAKE)  237 mL Oral TID BM  . ferrous sulfate  325 mg Oral Q2200  . fluticasone  2 spray Each Nare Daily  . hydrALAZINE  25 mg Oral Q8H  . Influenza vac split quadrivalent PF  0.5 mL Intramuscular Tomorrow-1000  . insulin aspart  0-9 Units Subcutaneous TID WC  . isosorbide mononitrate  30 mg Oral BID WC  . levofloxacin  750 mg Oral Daily  .  nystatin cream   Topical BID  . vitamin B-12  100 mcg Oral Daily   Continuous:  OYD:XAJOINOMVEHMC **OR** acetaminophen, gabapentin, HYDROcodone-acetaminophen, ondansetron **OR** ondansetron (ZOFRAN) IV  Assessment/Plan:  Principal Problem:   Community acquired pneumonia Active Problems:   Myeloproliferative disease ([place])   Obstructive sleep apnea   Essential hypertension   Coronary atherosclerosis   CKD (chronic kidney disease) stage 3, GFR 30-59 ml/min   Chronic systolic heart failure (H[facility])   Pneumonia   Pressure injury of skin   Community acquired pneumonia -Cough, shortness of breath for anorexia finding of pneumonia. -Treated initially with IV antibiotics switched to oral Levaquin. -Influenza PCR was negative. Strep pneumonia antigen was negative.  -Blood cultures are negative so far. Ambulate nor.  Body aches -Generalized body aches, influenza PCR is negative, rest of respiratory viruses was not checked. -Cannot rule out also has coinfection with respiratory viruses.   History of Diastolic CHF  Last EF measured in August 2017 was 55-60% with grade 1 diastolic dysfunction. Since patient was tachycardic in the ER patient was given fluid bolus. Patient's torsemide was held at the time of admission.  Has chronic looks of any edema, torsemide was held on admission we'll restart and give extra dose to make up for yesterday.  CAD Continue Coreg and aspirin. Currently stable. Troponins were negative.  Essential Hypertension Continue Coreg and hydralazine and Imdur.  Chronic anemia Hemoglobin appears to be stable. Continue to monitor.  Diabetes mellitus type 2 Apparently diet controlled. Continue sliding scale insulin coverage. Monitor CBGs.   Myeloproliferative disorder Patient had declined further treatment. Bony sclerotic lesions also noted on CT scan, which has been seen previously as well. Outpatient follow-up with PCP.  OSA On CPAP.  Chronic kidney disease  stage III Continue to monitor renal function closely. Appears to be close to baseline.   DVT Prophylaxis: Lovenox    Code Status: Full code  Family Communication: Discussed with the patient  Disposition Plan: Management as outlined above. PT and OT evaluation. Change to oral antibiotics today.    LOS: 2 days   [facility] A  [facility] Pager [phone] 04/03/2016, 12:14 PM  If 7PM-7AM, please contact night-coverage at [email or web address], password [facility]

## 2016-04-04 LAB — CBC
HCT: 30.7 % — ABNORMAL LOW (ref 39.0–52.0)
Hemoglobin: 9.4 g/dL — ABNORMAL LOW (ref 13.0–17.0)
MCH: 24.9 pg — AB (ref 26.0–34.0)
MCHC: 30.6 g/dL (ref 30.0–36.0)
MCV: 81.2 fL (ref 78.0–100.0)
PLATELETS: 474 10*3/uL — AB (ref 150–400)
RBC: 3.78 MIL/uL — ABNORMAL LOW (ref 4.22–5.81)
RDW: 19.4 % — AB (ref 11.5–15.5)
WBC: 11.4 10*3/uL — AB (ref 4.0–10.5)

## 2016-04-04 LAB — BASIC METABOLIC PANEL
Anion gap: 10 (ref 5–15)
BUN: 48 mg/dL — AB (ref 4–21)
BUN: 48 mg/dL — AB (ref 6–20)
CALCIUM: 10.1 mg/dL (ref 8.9–10.3)
CHLORIDE: 104 mmol/L (ref 101–111)
CO2: 23 mmol/L (ref 22–32)
CREATININE: 1.77 mg/dL — AB (ref 0.61–1.24)
Creatinine: 1.8 mg/dL — AB (ref 0.6–1.3)
GFR calc non Af Amer: 36 mL/min — ABNORMAL LOW (ref >60)
GFR, EST AFRICAN AMERICAN: 42 mL/min — AB (ref >60)
Glucose, Bld: 107 mg/dL — ABNORMAL HIGH (ref 65–99)
Glucose: 107 mg/dL
POTASSIUM: 4.5 mmol/L (ref 3.4–5.3)
Potassium: 4.5 mmol/L (ref 3.5–5.1)
SODIUM: 137 mmol/L (ref 137–147)
SODIUM: 137 mmol/L (ref 135–145)

## 2016-04-04 LAB — CBC AND DIFFERENTIAL
HEMATOCRIT: 31 % — AB (ref 41–53)
HEMOGLOBIN: 9.4 g/dL — AB (ref 13.5–17.5)
Platelets: 474 10*3/uL — AB (ref 150–399)
WBC: 11.4 10^3/mL

## 2016-04-04 LAB — GLUCOSE, CAPILLARY
GLUCOSE-CAPILLARY: 99 mg/dL (ref 65–99)
GLUCOSE-CAPILLARY: 94 mg/dL (ref 65–99)

## 2016-04-04 MED ORDER — LEVOFLOXACIN 750 MG PO TABS: 750.0000 mg | ORAL_TABLET | Freq: Every day | ORAL | 0 refills | Status: DC

## 2016-04-04 MED ORDER — HYDROCODONE-ACETAMINOPHEN 5-325 MG PO TABS: 1.0000 | ORAL_TABLET | Freq: Four times a day (QID) | ORAL | 0 refills | Status: DC | PRN

## 2016-04-04 MED ORDER — GUAIFENESIN ER 600 MG PO TB12: 1200.0000 mg | ORAL_TABLET | Freq: Two times a day (BID) | ORAL | 0 refills | Status: DC

## 2016-04-04 MED ORDER — FUROSEMIDE 10 MG/ML IJ SOLN: 40.0000 mg | Freq: Once | INTRAMUSCULAR | Status: DC

## 2016-04-04 NOTE — Discharge Summary (Signed)
Physician Discharge Summary  Juan [name] YTK:[id number] DOB: 1941/05/04 DOA: 04/01/2016  PCP: [name], MD  Admit date: 04/01/2016 Discharge date: 04/04/2016  Admitted From: Home Disposition: Home  Recommendations for Outpatient Follow-up:  1. Follow up with PCP in 1-2 weeks 2. Please obtain BMP/CBC in one week 3. Discharge on levofloxacin for 5 more days.  Home Health: Home health PT/OT Equipment/Devices:NA  Discharge Condition: Stable CODE STATUS: Full Code Diet recommendation: Diet heart healthy/carb modified Room service appropriate? Yes; Fluid consistency: Thin Diet - low sodium heart healthy  Brief/Interim Summary: 75 year old African-American male with a past medical history of coronary artery disease, diabetes, diastolic and systolic CHF , presented with complaints of weakness, cough, and upper and lower back pain ongoing for the last few days. Patient was found to be febrile with x-ray suggesting possible pneumonia. Patient was hospitalized for further management of community-acquired pneumonia.  Discharge Diagnoses:  Principal Problem:   Community acquired pneumonia Active Problems:   Myeloproliferative disease ([place])   Obstructive sleep apnea   Essential hypertension   Coronary atherosclerosis   CKD (chronic kidney disease) stage 3, GFR 30-59 ml/min   Chronic systolic heart failure (HCC)   Pneumonia   Pressure injury of skin   Community acquired pneumonia -Cough, SOB and CXR finding of pneumonia. -Treated initially with IV antibiotics switched to oral Levaquin. -Influenza PCR was negative. Strep pneumonia antigen was negative.  -Blood cultures are negative so far. Ambulated without need of oxygen. -Levofloxacin for 5 more days and Mucinex.  Body aches -Generalized body aches, influenza PCR is negative, rest of respiratory viruses was not checked. -Cannot rule out also has coinfection with respiratory viruses.   History of Diastolic CHF -Last EF measured  in August 2017 was 55-60% with grade 1 diastolic dysfunction.  -Since patient was tachycardic in the ER patient was given fluid bolus.  -His torsemide was held on admission and given one bolus of IV fluids. -Has chronic bilateral LE edema, given 2 doses of Lasix while in the hospital, Demadex restarted on discharge. -I have asked him to keep his leg elevated at home.  CAD Continue Coreg and aspirin. Currently stable. Troponins were negative.  Essential Hypertension Continue Coreg and hydralazine and Imdur.  Chronic anemia Hemoglobin appears to be stable. Continue to monitor.  Diabetes mellitus type 2 Apparently diet controlled. Continue sliding scale insulin coverage. Monitor CBGs.   Myeloproliferative disorder Patient had declined further treatment. Bony sclerotic lesions also noted on CT scan, which has been seen previously as well. Outpatient follow-up with PCP.  OSA On CPAP.  Chronic kidney disease stage III Continue to monitor renal function closely. Appears to be close to baseline.  Disposition -Patient was very anxious about leaving the hospital and complaining about pain. -When I mentioned we can provide pain medication to start to bargain and asked he doesn't have the money for pain medicine. -I told him we can help him with medication than he reported that he can go home because no transportation. -I think overall patient is anxious but he has his son is support system at home, home health PT/OT as well.  Discharge Instructions  Discharge Instructions    Diet - low sodium heart healthy    Complete by:  As directed    Increase activity slowly    Complete by:  As directed        Medication List    TAKE these medications   acetaminophen 500 MG tablet Commonly known as:  TYLENOL Take 500-1,000 mg by  mouth every 6 (six) hours as needed for moderate pain or headache. Reported on 05/02/2015   aspirin 325 MG tablet Take 1 tablet (325 mg total) by mouth  daily.   carvedilol 6.25 MG tablet Commonly known as:  COREG TAKE 1 TABLET BY MOUTH 2 TIMES DAILY. What changed:  See the new instructions.   feeding supplement (GLUCERNA SHAKE) Liqd Take 237 mLs by mouth as needed (protein). Reported on 08/21/2015   ferrous gluconate 225 (27 Fe) MG tablet Commonly known as:  FERGON Take 240 mg by mouth daily at 10 pm.   fluticasone 50 MCG/ACT nasal spray Commonly known as:  FLONASE Place 2 sprays into both nostrils daily. Reported on 05/02/2015   gabapentin 100 MG capsule Commonly known as:  NEURONTIN Take 100 mg by mouth 3 (three) times daily as needed (pain). Reported on 08/22/2015   guaiFENesin 600 MG 12 hr tablet Commonly known as:  MUCINEX Take 2 tablets (1,200 mg total) by mouth 2 (two) times daily.   hydrALAZINE 25 MG tablet Commonly known as:  APRESOLINE TAKE 2 TABLETS BY MOUTH 3 TIMES DAILY. What changed:  See the new instructions.   hydrocerin Crea Apply Eucerin cream to BLE Q day after bathing and roughly towel drying to remove loose skin   HYDROcodone-acetaminophen 5-325 MG tablet Commonly known as:  NORCO/VICODIN Take 1 tablet by mouth every 6 (six) hours as needed for moderate pain.   isosorbide mononitrate 30 MG 24 hr tablet Commonly known as:  IMDUR TAKE 1 TABLET (30 MG TOTAL) BY MOUTH 2 (TWO) TIMES DAILY. What changed:  when to take this  reasons to take this   levofloxacin 750 MG tablet Commonly known as:  LEVAQUIN Take 1 tablet (750 mg total) by mouth daily.   simethicone 80 MG chewable tablet Commonly known as:  MYLICON Chew 80 mg by mouth every 6 (six) hours as needed for flatulence.   torsemide 20 MG tablet Commonly known as:  DEMADEX Take 4 tablets (80 mg total) by mouth daily.   vitamin B-12 100 MCG tablet Commonly known as:  CYANOCOBALAMIN Take 100 mcg by mouth daily.      Follow-up Information    [facility] Follow up.   Why:  Home Health RN Contact information: [place] [phone]          No Known Allergies  Consultations:  None   Procedures (Echo, Carotid, EGD, Colonoscopy, ERCP)   Radiological studies: Dg Chest 2 View  Result Date: 04/01/2016 CLINICAL DATA:  Acute onset of fever, cough and body aches. Initial encounter. EXAM: CHEST  2 VIEW COMPARISON:  Chest radiograph performed 12/29/2015 FINDINGS: The lungs are well-aerated. Vascular congestion is noted. Bilateral central airspace opacities may reflect pneumonia or pulmonary edema. No pleural effusion or pneumothorax is seen. The heart is mildly enlarged. No acute osseous abnormalities are seen. IMPRESSION: Vascular congestion and mild cardiomegaly. Bilateral central airspace opacities may reflect pneumonia or pulmonary edema. Electronically Signed   By: Garald Balding M.D.   On: 04/01/2016 03:08   Ct Chest Wo Contrast  Result Date: 04/01/2016 CLINICAL DATA:  Chest pain. Evaluate for aortic aneurysm. History of myeloproliferative disorder. EXAM: CT CHEST WITHOUT CONTRAST TECHNIQUE: Multidetector CT imaging of the chest was performed following the standard protocol without IV contrast. COMPARISON:  06/21/2014 FINDINGS: Cardiovascular: Ascending thoracic aorta measures up to 4.4 cm and stable. Atherosclerotic calcifications involving the posterior aortic arch. Proximal descending thoracic aorta measures 3.8 cm and stable.  Mid descending thoracic aorta measures 3.4 cm and stable. Main pulmonary artery is markedly enlarged measuring 5 cm but stable. Mediastinum/Nodes: Small mediastinal lymph nodes have not significantly changed. There is a 1 cm hypodense nodule or cyst in the right thyroid lobe. There is subcutaneous edema in the chest. No significant pericardial fluid. Lungs/Pleura: Trachea and mainstem bronchi are patent. Again noted is a mosaic attenuation pattern throughout the lungs. This pattern probably represents multiple areas of air trapping. These are  prominent pulmonary vessels in the lung bases. Volume loss in the right lower lobe related to elevation of the right hemidiaphragm. There is some septal thickening in the upper lobes. No significant pleural effusions. Upper Abdomen: Again noted is an enlarged spleen which is only partially visualized. No acute abnormality in the upper abdomen. There is a high-density structure within the stomach lumen. Musculoskeletal: The visualized bones are diffusely sclerotic which is similar to the previous examination. There are small scattered lucent areas throughout the bones. Some of these lucent areas/lesions have enlarged from the prior examination. Lucent lesion along the posterior left sixth rib now measures 1.1 cm and previously measured 0.7 cm. No evidence for pathologic fracture. 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 Recommend annual imaging followup by CTA or MRA. This recommendation follows 2010 ACCF/AHA/AATS/ACR/ASA/SCA/SCAI/SIR/STS/SVM Guidelines for the Diagnosis and Management of Patients with Thoracic Aortic Disease. Circulation. 2010; 121: H885-O277 Stable enlargement of pulmonary arteries compatible with pulmonary hypertension. Mosaic attenuation pattern throughout both lungs. Similar finding was present on the previous examination. This is probably related to a combination of air trapping and mild edema. Bones are diffusely sclerotic with enlarged lucent lesions. 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 Splenomegaly. This is probably related to the history of myeloproliferative disorder. Electronically Signed   By: Markus Daft M.D.   On: 04/01/2016 11:14     Subjective:  Discharge Exam: Vitals:   04/03/16 1000 04/03/16 1652 04/03/16 2002 04/04/16 0428  BP: 136/69 118/77 129/75 125/70  Pulse: 89 80 96 87  Resp: '18 18 18 18  '$ Temp: 98.1 F (36.7 C) 97.5 F (36.4 C) 98.6 F (37 C) 98.6 F (37 C)  TempSrc: Oral Oral    SpO2: 96%  96% 93% 90%  Weight:    92.1 kg (203 lb)  Height:       General: Pt is alert, awake, not in acute distress Cardiovascular: RRR, S1/S2 +, no rubs, no gallops Respiratory: CTA bilaterally, no wheezing, no rhonchi Abdominal: Soft, NT, ND, bowel sounds + Extremities: no edema, no cyanosis   The results of significant diagnostics from this hospitalization (including imaging, microbiology, ancillary and laboratory) are listed below for reference.    Microbiology: Recent Results (from the past 240 hour(s))  Urine culture     Status: None   Collection Time: 04/01/16  2:40 AM  Result Value Ref Range Status   Specimen Description URINE, RANDOM  Final   Special Requests NONE  Final   Culture NO GROWTH  Final   Report Status 04/02/2016 FINAL  Final  Blood culture (routine x 2)     Status: None (Preliminary result)   Collection Time: 04/01/16  2:42 AM  Result Value Ref Range Status   Specimen Description BLOOD RIGHT ARM  Final   Special Requests BOTTLES DRAWN AEROBIC AND ANAEROBIC 5ML  Final   Culture NO GROWTH 3 DAYS  Final   Report Status PENDING  Incomplete  Blood culture (routine x 2)  Status: None (Preliminary result)   Collection Time: 04/01/16  4:26 AM  Result Value Ref Range Status   Specimen Description BLOOD LEFT HAND  Final   Special Requests IN PEDIATRIC BOTTLE 4ML  Final   Culture NO GROWTH 3 DAYS  Final   Report Status PENDING  Incomplete  Culture, sputum-assessment     Status: None   Collection Time: 04/01/16  8:42 AM  Result Value Ref Range Status   Specimen Description EXPECTORATED SPUTUM  Final   Special Requests NONE  Final   Sputum evaluation   Final    THIS SPECIMEN IS ACCEPTABLE. RESPIRATORY CULTURE REPORT TO FOLLOW.   Report Status 04/01/2016 FINAL  Final  Culture, respiratory (NON-Expectorated)     Status: None   Collection Time: 04/01/16  8:42 AM  Result Value Ref Range Status   Specimen Description EXPECTORATED SPUTUM  Final   Special Requests NONE   Final   Gram Stain   Final    NO WBC SEEN MODERATE GRAM POSITIVE COCCI IN PAIRS IN SINGLES MODERATE GRAM NEGATIVE RODS MODERATE GRAM VARIABLE ROD MODERATE GRAM POSITIVE RODS MODERATE YEAST FEW SQUAMOUS EPITHELIAL CELLS PRESENT    Culture Consistent with normal respiratory flora.  Final   Report Status 04/03/2016 FINAL  Final     Labs: BNP (last 3 results)  Recent Labs  07/23/15 1141 12/29/15 1607 04/01/16 0426  BNP 250.0* 228.8* 973.5*   Basic Metabolic Panel:  Recent Labs Lab 04/01/16 0247 04/01/16 0826 04/02/16 0717 04/03/16 0504 04/04/16 0525  NA 136 138 137 136 137  K 4.4 4.0 4.2 4.0 4.5  CL 104 105 107 105 104  CO2 '23 24 23 23 23  '$ GLUCOSE 104* 110* 102* 125* 107*  BUN 59* 51* 46* 45* 48*  CREATININE 1.66* 1.60* 1.48* 1.35* 1.77*  CALCIUM 9.6 9.5 9.9 9.5 10.1   Liver Function Tests:  Recent Labs Lab 04/01/16 0247 04/01/16 0826  AST 27 30  ALT 14* 14*  ALKPHOS 81 78  BILITOT 0.5 0.4  PROT 6.3* 5.9*  ALBUMIN 3.3* 3.0*   No results for input(s): LIPASE, AMYLASE in the last 168 hours. No results for input(s): AMMONIA in the last 168 hours. CBC:  Recent Labs Lab 04/01/16 0247 04/01/16 0826 04/02/16 0717 04/03/16 0504 04/04/16 0525  WBC 10.7* 9.6 10.7* 9.5 11.4*  NEUTROABS 6.5 4.9  --   --   --   HGB 8.6* 8.7* 8.7* 8.1* 9.4*  HCT 27.7* 27.9* 28.0* 26.0* 30.7*  MCV 80.3 80.6 80.2 80.7 81.2  PLT 359 344 373 338 474*   Cardiac Enzymes:  Recent Labs Lab 04/01/16 0826 04/01/16 1359 04/01/16 2048  TROPONINI <0.03 <0.03 <0.03   BNP: Invalid input(s): POCBNP CBG:  Recent Labs Lab 04/03/16 0738 04/03/16 1151 04/03/16 1616 04/03/16 2022 04/04/16 0811  GLUCAP 109* 123* 126* 116* 99   D-Dimer No results for input(s): DDIMER in the last 72 hours. Hgb A1c No results for input(s): HGBA1C in the last 72 hours. Lipid Profile No results for input(s): CHOL, HDL, LDLCALC, TRIG, CHOLHDL, LDLDIRECT in the last 72 hours. Thyroid function  studies No results for input(s): TSH, T4TOTAL, T3FREE, THYROIDAB in the last 72 hours.  Invalid input(s): FREET3 Anemia work up No results for input(s): VITAMINB12, FOLATE, FERRITIN, TIBC, IRON, RETICCTPCT in the last 72 hours. Urinalysis    Component Value Date/Time   COLORURINE YELLOW 04/01/2016 [place] 04/01/2016 0240   LABSPEC 1.011 04/01/2016 0240   PHURINE 6.0 04/01/2016 0240  GLUCOSEU NEGATIVE 04/01/2016 0240   GLUCOSEU NEGATIVE 05/02/2015 1008   HGBUR NEGATIVE 04/01/2016 0240   BILIRUBINUR NEGATIVE 04/01/2016 0240   KETONESUR NEGATIVE 04/01/2016 0240   PROTEINUR 100 (A) 04/01/2016 0240   UROBILINOGEN 0.2 05/02/2015 1008   NITRITE NEGATIVE 04/01/2016 0240   LEUKOCYTESUR NEGATIVE 04/01/2016 0240   Sepsis Labs Invalid input(s): PROCALCITONIN,  WBC,  LACTICIDVEN Microbiology Recent Results (from the past 240 hour(s))  Urine culture     Status: None   Collection Time: 04/01/16  2:40 AM  Result Value Ref Range Status   Specimen Description URINE, RANDOM  Final   Special Requests NONE  Final   Culture NO GROWTH  Final   Report Status 04/02/2016 FINAL  Final  Blood culture (routine x 2)     Status: None (Preliminary result)   Collection Time: 04/01/16  2:42 AM  Result Value Ref Range Status   Specimen Description BLOOD RIGHT ARM  Final   Special Requests BOTTLES DRAWN AEROBIC AND ANAEROBIC 5ML  Final   Culture NO GROWTH 3 DAYS  Final   Report Status PENDING  Incomplete  Blood culture (routine x 2)     Status: None (Preliminary result)   Collection Time: 04/01/16  4:26 AM  Result Value Ref Range Status   Specimen Description BLOOD LEFT HAND  Final   Special Requests IN PEDIATRIC BOTTLE 4ML  Final   Culture NO GROWTH 3 DAYS  Final   Report Status PENDING  Incomplete  Culture, sputum-assessment     Status: None   Collection Time: 04/01/16  8:42 AM  Result Value Ref Range Status   Specimen Description EXPECTORATED SPUTUM  Final   Special Requests  NONE  Final   Sputum evaluation   Final    THIS SPECIMEN IS ACCEPTABLE. RESPIRATORY CULTURE REPORT TO FOLLOW.   Report Status 04/01/2016 FINAL  Final  Culture, respiratory (NON-Expectorated)     Status: None   Collection Time: 04/01/16  8:42 AM  Result Value Ref Range Status   Specimen Description EXPECTORATED SPUTUM  Final   Special Requests NONE  Final   Gram Stain   Final    NO WBC SEEN MODERATE GRAM POSITIVE COCCI IN PAIRS IN SINGLES MODERATE GRAM NEGATIVE RODS MODERATE GRAM VARIABLE ROD MODERATE GRAM POSITIVE RODS MODERATE YEAST FEW SQUAMOUS EPITHELIAL CELLS PRESENT    Culture Consistent with normal respiratory flora.  Final   Report Status 04/03/2016 FINAL  Final     Time coordinating discharge: Over 30 minutes  SIGNED:   Birdie Hopes, MD  [facility] 04/04/2016, 9:54 AM Pager   If 7PM-7AM, please contact night-coverage [email or web address] Password [email or web address]

## 2016-04-04 NOTE — Progress Notes (Signed)
[name] to be D/C'd Home per MD order.  Discussed prescriptions and follow up appointments with the patient. Prescriptions given to patient, medication list explained in detail. Pt verbalized understanding.    Medication List    TAKE these medications   acetaminophen 500 MG tablet Commonly known as:  TYLENOL Take 500-1,000 mg by mouth every 6 (six) hours as needed for moderate pain or headache. Reported on 05/02/2015   aspirin 325 MG tablet Take 1 tablet (325 mg total) by mouth daily.   carvedilol 6.25 MG tablet Commonly known as:  COREG TAKE 1 TABLET BY MOUTH 2 TIMES DAILY. What changed:  See the new instructions.   feeding supplement (GLUCERNA SHAKE) Liqd Take 237 mLs by mouth as needed (protein). Reported on 08/21/2015   ferrous gluconate 225 (27 Fe) MG tablet Commonly known as:  FERGON Take 240 mg by mouth daily at 10 pm.   fluticasone 50 MCG/ACT nasal spray Commonly known as:  FLONASE Place 2 sprays into both nostrils daily. Reported on 05/02/2015   gabapentin 100 MG capsule Commonly known as:  NEURONTIN Take 100 mg by mouth 3 (three) times daily as needed (pain). Reported on 08/22/2015   guaiFENesin 600 MG 12 hr tablet Commonly known as:  MUCINEX Take 2 tablets (1,200 mg total) by mouth 2 (two) times daily.   hydrALAZINE 25 MG tablet Commonly known as:  APRESOLINE TAKE 2 TABLETS BY MOUTH 3 TIMES DAILY. What changed:  See the new instructions.   hydrocerin Crea Apply Eucerin cream to BLE Q day after bathing and roughly towel drying to remove loose skin   HYDROcodone-acetaminophen 5-325 MG tablet Commonly known as:  NORCO/VICODIN Take 1 tablet by mouth every 6 (six) hours as needed for moderate pain.   isosorbide mononitrate 30 MG 24 hr tablet Commonly known as:  IMDUR TAKE 1 TABLET (30 MG TOTAL) BY MOUTH 2 (TWO) TIMES DAILY. What changed:  when to take this  reasons to take this   levofloxacin 750 MG tablet Commonly known as:  LEVAQUIN Take 1 tablet  (750 mg total) by mouth daily.   simethicone 80 MG chewable tablet Commonly known as:  MYLICON Chew 80 mg by mouth every 6 (six) hours as needed for flatulence.   torsemide 20 MG tablet Commonly known as:  DEMADEX Take 4 tablets (80 mg total) by mouth daily.   vitamin B-12 100 MCG tablet Commonly known as:  CYANOCOBALAMIN Take 100 mcg by mouth daily.       Vitals:   04/03/16 2002 04/04/16 0428  BP: 129/75 125/70  Pulse: 96 87  Resp: 18 18  Temp: 98.6 F (37 C) 98.6 F (37 C)    Skin clean, dry and intact without evidence of skin break down, no evidence of skin tears noted. IV catheter discontinued intact. Site without signs and symptoms of complications. Dressing and pressure applied. Pt denies pain at this time. No complaints noted.  An After Visit Summary was printed and given to the patient. Patient escorted via stretcher, and D/C home via [place], RN [facility] Phone [phone]

## 2016-04-04 NOTE — Care Management (Signed)
Patient being discharge home in stable condition. Patient being transported home via [place]. No further CM needs identified.

## 2016-04-04 NOTE — Progress Notes (Signed)
   04/04/16 0700  Clinical Encounter Type  Visited With Patient  Visit Type Initial  Referral From Nurse  Consult/Referral To Chaplain  Spiritual Encounters  Spiritual Needs Prayer;Emotional  Stress Factors  Patient Stress Factors Health changes  CHP received request for prayer the previous evening and was unable to honor request due to ED activity.  CHP visited patient in the morning, providing presence, listening and prayer. Roe Coombs 04/04/16

## 2016-04-06 ENCOUNTER — Emergency Department (HOSPITAL_COMMUNITY): Payer: Medicare Other

## 2016-04-06 ENCOUNTER — Encounter (HOSPITAL_COMMUNITY): Payer: Self-pay | Admitting: Emergency Medicine

## 2016-04-06 ENCOUNTER — Inpatient Hospital Stay (HOSPITAL_COMMUNITY)
Admission: EM | Admit: 2016-04-06 | Discharge: 2016-04-18 | DRG: 291 | Disposition: A | Discharge status: Skilled Nursing Facility | Payer: Medicare Other | Attending: Internal Medicine | Admitting: Internal Medicine

## 2016-04-06 ENCOUNTER — Telehealth: Payer: Self-pay | Admitting: *Deleted

## 2016-04-06 DIAGNOSIS — K5900 Constipation, unspecified: Secondary | ICD-10-CM | POA: Diagnosis not present

## 2016-04-06 DIAGNOSIS — N132 Hydronephrosis with renal and ureteral calculous obstruction: Secondary | ICD-10-CM | POA: Diagnosis present

## 2016-04-06 DIAGNOSIS — I5023 Acute on chronic systolic (congestive) heart failure: Secondary | ICD-10-CM

## 2016-04-06 DIAGNOSIS — N323 Diverticulum of bladder: Secondary | ICD-10-CM | POA: Diagnosis present

## 2016-04-06 DIAGNOSIS — E875 Hyperkalemia: Secondary | ICD-10-CM | POA: Diagnosis not present

## 2016-04-06 DIAGNOSIS — C946 Myelodysplastic disease, not classified: Secondary | ICD-10-CM | POA: Diagnosis present

## 2016-04-06 DIAGNOSIS — J189 Pneumonia, unspecified organism: Secondary | ICD-10-CM | POA: Diagnosis present

## 2016-04-06 DIAGNOSIS — G4733 Obstructive sleep apnea (adult) (pediatric): Secondary | ICD-10-CM | POA: Diagnosis present

## 2016-04-06 DIAGNOSIS — J9601 Acute respiratory failure with hypoxia: Secondary | ICD-10-CM | POA: Diagnosis not present

## 2016-04-06 DIAGNOSIS — N179 Acute kidney failure, unspecified: Secondary | ICD-10-CM | POA: Diagnosis present

## 2016-04-06 DIAGNOSIS — J9600 Acute respiratory failure, unspecified whether with hypoxia or hypercapnia: Secondary | ICD-10-CM | POA: Diagnosis present

## 2016-04-06 DIAGNOSIS — I712 Thoracic aortic aneurysm, without rupture, unspecified: Secondary | ICD-10-CM | POA: Diagnosis present

## 2016-04-06 DIAGNOSIS — E46 Unspecified protein-calorie malnutrition: Secondary | ICD-10-CM | POA: Diagnosis present

## 2016-04-06 DIAGNOSIS — N320 Bladder-neck obstruction: Secondary | ICD-10-CM | POA: Diagnosis present

## 2016-04-06 DIAGNOSIS — G8929 Other chronic pain: Secondary | ICD-10-CM | POA: Diagnosis present

## 2016-04-06 DIAGNOSIS — I471 Supraventricular tachycardia: Secondary | ICD-10-CM | POA: Diagnosis not present

## 2016-04-06 DIAGNOSIS — E1140 Type 2 diabetes mellitus with diabetic neuropathy, unspecified: Secondary | ICD-10-CM | POA: Diagnosis present

## 2016-04-06 DIAGNOSIS — D72829 Elevated white blood cell count, unspecified: Secondary | ICD-10-CM | POA: Diagnosis present

## 2016-04-06 DIAGNOSIS — Z87891 Personal history of nicotine dependence: Secondary | ICD-10-CM

## 2016-04-06 DIAGNOSIS — R0602 Shortness of breath: Secondary | ICD-10-CM | POA: Diagnosis not present

## 2016-04-06 DIAGNOSIS — D7581 Myelofibrosis: Secondary | ICD-10-CM | POA: Diagnosis present

## 2016-04-06 DIAGNOSIS — E1122 Type 2 diabetes mellitus with diabetic chronic kidney disease: Secondary | ICD-10-CM | POA: Diagnosis present

## 2016-04-06 DIAGNOSIS — R0902 Hypoxemia: Secondary | ICD-10-CM

## 2016-04-06 DIAGNOSIS — D473 Essential (hemorrhagic) thrombocythemia: Secondary | ICD-10-CM | POA: Diagnosis present

## 2016-04-06 DIAGNOSIS — I509 Heart failure, unspecified: Secondary | ICD-10-CM

## 2016-04-06 DIAGNOSIS — E869 Volume depletion, unspecified: Secondary | ICD-10-CM | POA: Diagnosis not present

## 2016-04-06 DIAGNOSIS — Z818 Family history of other mental and behavioral disorders: Secondary | ICD-10-CM

## 2016-04-06 DIAGNOSIS — N184 Chronic kidney disease, stage 4 (severe): Secondary | ICD-10-CM | POA: Diagnosis present

## 2016-04-06 DIAGNOSIS — I5043 Acute on chronic combined systolic (congestive) and diastolic (congestive) heart failure: Secondary | ICD-10-CM | POA: Diagnosis present

## 2016-04-06 DIAGNOSIS — G43909 Migraine, unspecified, not intractable, without status migrainosus: Secondary | ICD-10-CM | POA: Diagnosis present

## 2016-04-06 DIAGNOSIS — M25531 Pain in right wrist: Secondary | ICD-10-CM

## 2016-04-06 DIAGNOSIS — I2510 Atherosclerotic heart disease of native coronary artery without angina pectoris: Secondary | ICD-10-CM | POA: Diagnosis present

## 2016-04-06 DIAGNOSIS — M109 Gout, unspecified: Secondary | ICD-10-CM | POA: Diagnosis not present

## 2016-04-06 DIAGNOSIS — R627 Adult failure to thrive: Secondary | ICD-10-CM | POA: Diagnosis present

## 2016-04-06 DIAGNOSIS — Z7982 Long term (current) use of aspirin: Secondary | ICD-10-CM

## 2016-04-06 DIAGNOSIS — N189 Chronic kidney disease, unspecified: Secondary | ICD-10-CM

## 2016-04-06 DIAGNOSIS — N1330 Unspecified hydronephrosis: Secondary | ICD-10-CM

## 2016-04-06 DIAGNOSIS — M898X9 Other specified disorders of bone, unspecified site: Secondary | ICD-10-CM

## 2016-04-06 DIAGNOSIS — I130 Hypertensive heart and chronic kidney disease with heart failure and stage 1 through stage 4 chronic kidney disease, or unspecified chronic kidney disease: Principal | ICD-10-CM | POA: Diagnosis present

## 2016-04-06 DIAGNOSIS — D75839 Thrombocytosis, unspecified: Secondary | ICD-10-CM | POA: Diagnosis present

## 2016-04-06 DIAGNOSIS — N183 Chronic kidney disease, stage 3 (moderate): Secondary | ICD-10-CM | POA: Diagnosis not present

## 2016-04-06 DIAGNOSIS — Z6823 Body mass index (BMI) 23.0-23.9, adult: Secondary | ICD-10-CM

## 2016-04-06 DIAGNOSIS — I5022 Chronic systolic (congestive) heart failure: Secondary | ICD-10-CM

## 2016-04-06 DIAGNOSIS — Z7951 Long term (current) use of inhaled steroids: Secondary | ICD-10-CM

## 2016-04-06 HISTORY — DX: Chronic diastolic (congestive) heart failure: I50.32

## 2016-04-06 LAB — CULTURE, BLOOD (ROUTINE X 2)
CULTURE: NO GROWTH
Culture: NO GROWTH

## 2016-04-06 MED ORDER — LEVOFLOXACIN 500 MG PO TABS
500.0000 mg | ORAL_TABLET | Freq: Once | ORAL | Status: AC
  Administered 2016-04-06: 500 mg via ORAL
  Filled 2016-04-06: qty 1

## 2016-04-06 NOTE — ED Triage Notes (Signed)
Per [facility]: Pt to ED from home c/o back "soreness" that radiates around to bilateral flanks and SOB. Pt was discharged from [facility] on Saturday (pneumonia) and was sent home with prescriptions for pain medication and antibiotics. He states he could not get the abx filled d/t financial reasons. Patient is A&O x 4, resp e/u at this time. EMS VS - 90% Ra, placed on 2L O2 [place] - increased to 96%. HR 90s regular, RR 18. Pt also c/o having fevers and chills at home since being discharged.

## 2016-04-06 NOTE — ED Notes (Signed)
MD at bedside. 

## 2016-04-06 NOTE — ED Provider Notes (Signed)
[facility] DEPT Provider Note   CSN: [id number] Arrival date & time: 04/06/16  2234     History   Chief Complaint Chief Complaint  Patient presents with  . Shortness of Breath    HPI Juan [name] is a 75 y.o. male.  He was discharged from the hospital 2 days ago after being admitted for community-acquired pneumonia. He states he was unable to get the prescription for his antibiotic filled because it was too expensive, so he has not had any antibiotics since being discharged. Yesterday, he started having problems with increasing dyspnea, pain in his back and neck and sides. There is a cough productive of some clear mucus. He complains of nausea but no vomiting. He thinks he has been running fevers at home but has not checked his temperature. He also complains of chills and sweats. He rates pain at 9/10. Pain is slightly worse when he takes a deep breath, but he has not noticed anything else that makes it better or worse.   The history is provided by the patient.    Past Medical History:  Diagnosis Date  . Allergic rhinitis   . Anemia   . Ascending aortic aneurysm ([place]) 03/2014   4.3cm on CT scan  . CAD (coronary artery disease)    dx elsewheer in past, no documentation. Non-ischemic myovue 2007  . CHF (congestive heart failure) (HCC)    dx elsewhere, no documentation. Normal EF by previous echo. Trival AS needs f/u ECHO by 2012  . Edema    R>L leg, u/s 5-12 neg for DVT  . Hemorrhoid   . History of thrombocytosis   . Hypertension   . Migraine    "once/wk at least" (07/11/2013)  . Myeloproliferative disease ([place])   . Shortness of breath   . Sinus congestion   . Sleep apnea, obstructive    at some point used CPAP, was d/c  years ago  . Type II diabetes mellitus [facility])     Patient Active Problem List   Diagnosis Date Noted  . Community acquired pneumonia 04/01/2016  . Pneumonia 04/01/2016  . Pressure injury of skin 04/01/2016  . Pressure ulcer stage II 12/31/2015  .  UTI (urinary tract infection) 12/30/2015  . Sepsis ([place]) 12/29/2015  . UTI (lower urinary tract infection) 12/29/2015  . T wave inversion in EKG 12/29/2015  . PCP NOTES >>>>>> 05/02/2015  . Chronic systolic heart failure ([place]) 02/14/2015  . CKD (chronic kidney disease), stage III 01/22/2015  . Pain in limb   . Peripheral edema   . Unable to ambulate   . Thoracic aortic aneurysm ([place]) 04/26/2014  . CKD (chronic kidney disease) stage 3, GFR 30-59 ml/min 04/26/2014  . 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 Chest pain 04/15/2014  . Palliative care encounter 04/06/2014  . Weakness generalized 04/06/2014  . Venous stasis dermatitis of both lower extremities   . Malnutrition of moderate degree ([place]) 03/31/2014  . Morbid obesity due to excess calories ([place]) 03/29/2014  . 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 Leukocytosis 10/17/2013  . Poor compliance with advise  05/05/2012  . Annual physical exam 01/14/2012  . Weight loss 01/14/2012  . BPH (benign prostatic hyperplasia) 01/14/2012  . *Chronic lower extremity edema, stasis dermatitis 06/09/2011  . CARDIOMEGALY 04/29/2009  . *Prediabetes 09/07/2006  . Symptomatic anemia 09/07/2006  . Myeloproliferative disease ([place]) 09/07/2006  . Obstructive sleep apnea 09/07/2006  . Essential hypertension 09/07/2006  . Coronary atherosclerosis 09/07/2006  . *CHF-- PCP comments 09/07/2006  .  ALLERGIC RHINITIS 09/07/2006    Past Surgical History:  Procedure Laterality Date  . TOE SURGERY Right    "tried to straighten out big toe" (07/11/2013)       Home Medications    Prior to Admission medications   Medication Sig Start Date End Date Taking? Authorizing Provider  acetaminophen (TYLENOL) 500 MG tablet Take 500-1,000 mg by mouth every 6 (six) hours as needed for moderate pain or headache. Reported on 05/02/2015    Historical Provider, MD  aspirin 325 MG tablet Take 1 tablet (325 mg total) by mouth daily. 04/16/14    Ripudeep Krystal Eaton, MD  carvedilol (COREG) 6.25 MG tablet TAKE 1 TABLET BY MOUTH 2 TIMES DAILY. Patient taking differently: TAKE 12.5 MG BY MOUTH 2 TIMES DAILY. 03/10/16   Shirley Friar, PA-C  feeding supplement, GLUCERNA SHAKE, (GLUCERNA SHAKE) LIQD Take 237 mLs by mouth as needed (protein). Reported on 08/21/2015    Historical Provider, MD  ferrous gluconate (FERGON) 225 (27 Fe) MG tablet Take 240 mg by mouth daily at 10 pm.    Historical Provider, MD  fluticasone (FLONASE) 50 MCG/ACT nasal spray Place 2 sprays into both nostrils daily. Reported on 05/02/2015    Historical Provider, MD  gabapentin (NEURONTIN) 100 MG capsule Take 100 mg by mouth 3 (three) times daily as needed (pain). Reported on 08/22/2015    Historical Provider, MD  guaiFENesin (MUCINEX) 600 MG 12 hr tablet Take 2 tablets (1,200 mg total) by mouth 2 (two) times daily. 04/04/16   Verlee Monte, MD  hydrALAZINE (APRESOLINE) 25 MG tablet TAKE 2 TABLETS BY MOUTH 3 TIMES DAILY. Patient taking differently: TAKE 50 MG BY MOUTH 3 TIMES DAILY. 12/03/15   Amy D Ninfa Meeker, NP  hydrocerin (EUCERIN) CREA Apply Eucerin cream to BLE Q day after bathing and roughly towel drying to remove loose skin 01/27/14   Jonetta Osgood, MD  HYDROcodone-acetaminophen (NORCO/VICODIN) 5-325 MG tablet Take 1 tablet by mouth every 6 (six) hours as needed for moderate pain. 04/04/16   Verlee Monte, MD  isosorbide mononitrate (IMDUR) 30 MG 24 hr tablet TAKE 1 TABLET (30 MG TOTAL) BY MOUTH 2 (TWO) TIMES DAILY. Patient taking differently: Take 30 mg by mouth 2 (two) times daily as needed (for blood pressure).  03/10/16   Amy D Clegg, NP  levofloxacin (LEVAQUIN) 750 MG tablet Take 1 tablet (750 mg total) by mouth daily. 04/04/16 04/09/16  Verlee Monte, MD  simethicone (MYLICON) 80 MG chewable tablet Chew 80 mg by mouth every 6 (six) hours as needed for flatulence.    Historical Provider, MD  torsemide (DEMADEX) 20 MG tablet Take 4 tablets (80 mg total) by mouth daily.  03/14/15   Jolaine Artist, MD  vitamin B-12 (CYANOCOBALAMIN) 100 MCG tablet Take 100 mcg by mouth daily.    Historical Provider, MD    Family History Family History  Problem Relation Age of Onset  . Schizophrenia Son   . Colon cancer Neg Hx   . Prostate cancer Neg Hx   . Heart attack Neg Hx   . Diabetes Neg Hx     Social History Social History  Substance Use Topics  . Smoking status: Former Smoker    Packs/day: 0.25    Years: 12.00    Types: Cigarettes    Quit date: 05/18/1966  . Smokeless tobacco: Never Used     Comment: quit smoking 45 years ago  . Alcohol use No     Allergies   Patient has no known  allergies.   Review of Systems Review of Systems  All other systems reviewed and are negative.    Physical Exam Updated Vital Signs BP 126/65 (BP Location: Right Arm)   Pulse 98   Temp 98.4 F (36.9 C) (Oral)   Resp 18   Ht '6\' 3"'$  (1.905 m)   Wt 205 lb (93 kg)   SpO2 (!) 89%   BMI 25.62 kg/m   Physical Exam  Nursing note and vitals reviewed.  75 year old male, resting comfortably and in no acute distress. Vital signs are normal. Oxygen saturation is 89%, which is hypoxic. Head is normocephalic and atraumatic. PERRLA, EOMI. Oropharynx is clear. Neck is nontender and supple without adenopathy or JVD. Back is nontender and there is no CVA tenderness. Lungs have decreased breath sounds at the right base. There are no rales, wheezes, or rhonchi. Chest is nontender. Heart has regular rate and rhythm without murmur. Abdomen is soft, flat, nontender without masses or hepatosplenomegaly and peristalsis is normoactive. Extremities have 2+ pedal and pretibial edema, full range of motion is present. Skin is warm and dry without rash. Neurologic: Mental status is normal, cranial nerves are intact, there are no motor or sensory deficits.  ED Treatments / Results  Labs (all labs ordered are listed, but only abnormal results are displayed) Labs Reviewed    COMPREHENSIVE METABOLIC PANEL  BRAIN NATRIURETIC PEPTIDE  TROPONIN I  CBC WITH DIFFERENTIAL/PLATELET  I-STAT CG4 LACTIC ACID, ED    EKG  EKG Interpretation  Date/Time:  Monday April 06 2016 23:18:40 EST Ventricular Rate:  85 PR Interval:    QRS Duration: 106 QT Interval:  357 QTC Calculation: 425 R Axis:   -28 Text Interpretation:  Sinus rhythm Borderline prolonged PR interval Borderline left axis deviation Baseline wander in lead(s) V 2 When compared with ECG of 12/30/2015, No significant change was found Confirmed by [facility]  MD, Supreme Rybarczyk ([id number]) on 04/06/2016 11:26:39 PM       Radiology Dg Chest 2 View  Result Date: 04/06/2016 CLINICAL DATA:  Shortness of breath. Sore back radiating to both flanks. Discharge from [facility] on Saturday with diagnosis of pneumonia. Continued fever and chills. EXAM: CHEST  2 VIEW COMPARISON:  04/01/2016 FINDINGS: Shallow inspiration with linear atelectasis in the lung bases. Cardiac enlargement. Diffuse airspace opacities in the lungs consistent with edema or multifocal pneumonia. Similar appearance to previous study. No blunting of costophrenic angles. No pneumothorax. Calcified aorta. Thoracic scoliosis convex towards the right. IMPRESSION: Cardiac enlargement. Diffuse airspace disease suggesting edema or pneumonia. Electronically Signed   By: Lucienne Capers M.D.   On: 04/06/2016 23:55    Procedures Procedures (including critical care time)  Medications Ordered in ED Medications  levofloxacin (LEVAQUIN) tablet 500 mg (not administered)     Initial Impression / Assessment and Plan / ED Course  I have reviewed the triage vital signs and the nursing notes.  Pertinent labs & imaging results that were available during my care of the patient were reviewed by me and considered in my medical decision making (see chart for details).  Clinical Course    Dyspnea in patient with recent community-acquired pneumonia who has failed to  complete his antibiotic regimen. We'll check chest x-ray. Exam is concerning for possible pleural effusion. Old records reviewed confirming recent hospitalization for community-acquired pneumonia and discharged with prescription for levofloxacin.  Chest x-ray does not show pleural effusion does appear to show increasing opacity. Radiologist read this as possible CHF. He is given a  dose of levofloxacin. Laboratory workup is pending at this time. He will need to be readmitted. Case is signed out to Dr. Leonides Schanz to review his lab results and arrange admission.  Final Clinical Impressions(s) / ED Diagnoses   Final diagnoses:  Hypoxia  Community acquired pneumonia, unspecified laterality    New Prescriptions New Prescriptions   No medications on file     Delora Fuel, MD 49/44/96 [id number]

## 2016-04-06 NOTE — ED Notes (Signed)
Attempted to draw labs. Pt transported to x-ray at this time.

## 2016-04-06 NOTE — Telephone Encounter (Signed)
Unable to reach patient at time of TCM Call.  Left message for patient to return call when available.    Pt needs 30 min hospital follow-up appt scheduled on or before 04/17/16.

## 2016-04-07 DIAGNOSIS — D649 Anemia, unspecified: Secondary | ICD-10-CM | POA: Diagnosis not present

## 2016-04-07 DIAGNOSIS — I5033 Acute on chronic diastolic (congestive) heart failure: Secondary | ICD-10-CM | POA: Diagnosis not present

## 2016-04-07 DIAGNOSIS — I509 Heart failure, unspecified: Secondary | ICD-10-CM

## 2016-04-07 DIAGNOSIS — D473 Essential (hemorrhagic) thrombocythemia: Secondary | ICD-10-CM | POA: Diagnosis present

## 2016-04-07 DIAGNOSIS — D75839 Thrombocytosis, unspecified: Secondary | ICD-10-CM | POA: Diagnosis present

## 2016-04-07 DIAGNOSIS — J189 Pneumonia, unspecified organism: Secondary | ICD-10-CM | POA: Diagnosis not present

## 2016-04-07 DIAGNOSIS — N179 Acute kidney failure, unspecified: Secondary | ICD-10-CM | POA: Diagnosis present

## 2016-04-07 DIAGNOSIS — I712 Thoracic aortic aneurysm, without rupture: Secondary | ICD-10-CM

## 2016-04-07 DIAGNOSIS — N189 Chronic kidney disease, unspecified: Secondary | ICD-10-CM

## 2016-04-07 LAB — I-STAT CG4 LACTIC ACID, ED: LACTIC ACID, VENOUS: 1.32 mmol/L (ref 0.5–1.9)

## 2016-04-07 LAB — BASIC METABOLIC PANEL
ANION GAP: 9 (ref 5–15)
BUN: 60 mg/dL — AB (ref 6–20)
BUN: 62 mg/dL — AB (ref 4–21)
CALCIUM: 9.6 mg/dL (ref 8.9–10.3)
CO2: 21 mmol/L — ABNORMAL LOW (ref 22–32)
CREATININE: 2.2 mg/dL — AB (ref 0.6–1.3)
Chloride: 104 mmol/L (ref 101–111)
Creatinine, Ser: 2.06 mg/dL — ABNORMAL HIGH (ref 0.61–1.24)
GFR calc Af Amer: 35 mL/min — ABNORMAL LOW (ref >60)
GFR, EST NON AFRICAN AMERICAN: 30 mL/min — AB (ref >60)
Glucose, Bld: 122 mg/dL — ABNORMAL HIGH (ref 65–99)
Glucose: 120 mg/dL
POTASSIUM: 4.1 mmol/L (ref 3.5–5.1)
Potassium: 4.1 mmol/L (ref 3.4–5.3)
SODIUM: 134 mmol/L — AB (ref 135–145)
Sodium: 134 mmol/L — AB (ref 137–147)

## 2016-04-07 LAB — CBC WITH DIFFERENTIAL/PLATELET
BASOS PCT: 2 %
BLASTS: 0 %
Band Neutrophils: 6 %
Basophils Absolute: 0 10*3/uL (ref 0.0–0.1)
Basophils Absolute: 0.3 10*3/uL — ABNORMAL HIGH (ref 0.0–0.1)
Basophils Relative: 0 %
EOS PCT: 4 %
EOS PCT: 5 %
Eosinophils Absolute: 0.4 10*3/uL (ref 0.0–0.7)
Eosinophils Absolute: 0.7 10*3/uL (ref 0.0–0.7)
HEMATOCRIT: 26.8 % — AB (ref 39.0–52.0)
HEMATOCRIT: 28.2 % — AB (ref 39.0–52.0)
HEMOGLOBIN: 8.5 g/dL — AB (ref 13.0–17.0)
HEMOGLOBIN: 9.1 g/dL — AB (ref 13.0–17.0)
LYMPHS PCT: 27 %
Lymphocytes Relative: 22 %
Lymphs Abs: 2.3 10*3/uL (ref 0.7–4.0)
Lymphs Abs: 3.6 10*3/uL (ref 0.7–4.0)
MCH: 24.6 pg — ABNORMAL LOW (ref 26.0–34.0)
MCH: 25.3 pg — ABNORMAL LOW (ref 26.0–34.0)
MCHC: 31.7 g/dL (ref 30.0–36.0)
MCHC: 32.3 g/dL (ref 30.0–36.0)
MCV: 77.5 fL — AB (ref 78.0–100.0)
MCV: 78.3 fL (ref 78.0–100.0)
MONO ABS: 1.7 10*3/uL — AB (ref 0.1–1.0)
MYELOCYTES: 2 %
Metamyelocytes Relative: 2 %
Monocytes Absolute: 1.1 10*3/uL — ABNORMAL HIGH (ref 0.1–1.0)
Monocytes Relative: 10 %
Monocytes Relative: 13 %
NEUTROS PCT: 54 %
NEUTROS PCT: 53 %
NRBC: 0 /100{WBCs}
Neutro Abs: 6.8 10*3/uL (ref 1.7–7.7)
Neutro Abs: 6.9 10*3/uL (ref 1.7–7.7)
Other: 0 %
PROMYELOCYTES ABS: 0 %
Platelets: 527 10*3/uL — ABNORMAL HIGH (ref 150–400)
Platelets: 578 10*3/uL — ABNORMAL HIGH (ref 150–400)
RBC: 3.46 MIL/uL — AB (ref 4.22–5.81)
RBC: 3.6 MIL/uL — AB (ref 4.22–5.81)
RDW: 18.9 % — ABNORMAL HIGH (ref 11.5–15.5)
RDW: 19.5 % — AB (ref 11.5–15.5)
WBC MORPHOLOGY: INCREASED
WBC: 10.6 10*3/uL — AB (ref 4.0–10.5)
WBC: 13.2 10*3/uL — AB (ref 4.0–10.5)

## 2016-04-07 LAB — CBC AND DIFFERENTIAL
HEMATOCRIT: 28 % — AB (ref 41–53)
HEMOGLOBIN: 9.1 g/dL — AB (ref 13.5–17.5)
PLATELETS: 578 10*3/uL — AB (ref 150–399)
WBC: 13.2 10*3/mL

## 2016-04-07 LAB — URINALYSIS, ROUTINE W REFLEX MICROSCOPIC
Bilirubin Urine: NEGATIVE
Glucose, UA: NEGATIVE mg/dL
Hgb urine dipstick: NEGATIVE
KETONES UR: NEGATIVE mg/dL
LEUKOCYTES UA: NEGATIVE
Nitrite: NEGATIVE
PROTEIN: 100 mg/dL — AB
Specific Gravity, Urine: 1.012 (ref 1.005–1.030)
pH: 5 (ref 5.0–8.0)

## 2016-04-07 LAB — COMPREHENSIVE METABOLIC PANEL
ALBUMIN: 3 g/dL — AB (ref 3.5–5.0)
ALT: 12 U/L — ABNORMAL LOW (ref 17–63)
ANION GAP: 10 (ref 5–15)
AST: 31 U/L (ref 15–41)
Alkaline Phosphatase: 76 U/L (ref 38–126)
BUN: 62 mg/dL — AB (ref 6–20)
CHLORIDE: 103 mmol/L (ref 101–111)
CO2: 21 mmol/L — AB (ref 22–32)
Calcium: 9.7 mg/dL (ref 8.9–10.3)
Creatinine, Ser: 2.22 mg/dL — ABNORMAL HIGH (ref 0.61–1.24)
GFR calc Af Amer: 32 mL/min — ABNORMAL LOW (ref >60)
GFR calc non Af Amer: 27 mL/min — ABNORMAL LOW (ref >60)
GLUCOSE: 120 mg/dL — AB (ref 65–99)
POTASSIUM: 4.1 mmol/L (ref 3.5–5.1)
SODIUM: 134 mmol/L — AB (ref 135–145)
Total Bilirubin: 0.3 mg/dL (ref 0.3–1.2)
Total Protein: 6.3 g/dL — ABNORMAL LOW (ref 6.5–8.1)

## 2016-04-07 LAB — URINE MICROSCOPIC-ADD ON: SQUAMOUS EPITHELIAL / LPF: NONE SEEN

## 2016-04-07 LAB — TROPONIN I
Troponin I: 0.03 ng/mL (ref <0.03)
Troponin I: <0.03 ng/mL (ref <0.03)
Troponin I: 0.1 ng/mL (ref <0.03)

## 2016-04-07 LAB — HEPATIC FUNCTION PANEL
ALK PHOS: 76 U/L (ref 25–125)
ALT: 12 U/L (ref 10–40)
AST: 31 U/L (ref 14–40)
Bilirubin, Total: 0.3 mg/dL

## 2016-04-07 LAB — BRAIN NATRIURETIC PEPTIDE: B Natriuretic Peptide: 137.1 pg/mL — ABNORMAL HIGH (ref 0.0–100.0)

## 2016-04-07 MED ORDER — GLUCERNA SHAKE PO LIQD
237.0000 mL | ORAL | Status: DC | PRN
  Filled 2016-04-07: qty 237

## 2016-04-07 MED ORDER — ASPIRIN 325 MG PO TABS
325.0000 mg | ORAL_TABLET | Freq: Every day | ORAL | Status: DC
  Administered 2016-04-07 – 2016-04-18 (×12): 325 mg via ORAL
  Filled 2016-04-07 (×12): qty 1

## 2016-04-07 MED ORDER — HEPARIN SODIUM (PORCINE) 5000 UNIT/ML IJ SOLN
5000.0000 [IU] | Freq: Three times a day (TID) | INTRAMUSCULAR | Status: DC
  Administered 2016-04-07 – 2016-04-18 (×34): 5000 [IU] via SUBCUTANEOUS
  Filled 2016-04-07 (×34): qty 1

## 2016-04-07 MED ORDER — SODIUM CHLORIDE 0.9 % IV SOLN: 250.0000 mL | INTRAVENOUS | Status: DC | PRN

## 2016-04-07 MED ORDER — FERROUS GLUCONATE 324 (38 FE) MG PO TABS
324.0000 mg | ORAL_TABLET | Freq: Every day | ORAL | Status: DC
  Administered 2016-04-07 – 2016-04-15 (×9): 324 mg via ORAL
  Filled 2016-04-07 (×9): qty 1

## 2016-04-07 MED ORDER — ISOSORBIDE MONONITRATE ER 30 MG PO TB24
30.0000 mg | ORAL_TABLET | Freq: Two times a day (BID) | ORAL | Status: DC
  Administered 2016-04-07 – 2016-04-09 (×5): 30 mg via ORAL
  Filled 2016-04-07 (×5): qty 1

## 2016-04-07 MED ORDER — IPRATROPIUM-ALBUTEROL 0.5-2.5 (3) MG/3ML IN SOLN: 3.0000 mL | RESPIRATORY_TRACT | Status: DC | PRN

## 2016-04-07 MED ORDER — FUROSEMIDE 10 MG/ML IJ SOLN
40.0000 mg | Freq: Once | INTRAMUSCULAR | Status: AC
  Administered 2016-04-07: 40 mg via INTRAVENOUS
  Filled 2016-04-07: qty 4

## 2016-04-07 MED ORDER — FLUTICASONE PROPIONATE 50 MCG/ACT NA SUSP
2.0000 | Freq: Every day | NASAL | Status: DC
  Administered 2016-04-07 – 2016-04-18 (×12): 2 via NASAL
  Filled 2016-04-07: qty 16

## 2016-04-07 MED ORDER — ADULT MULTIVITAMIN W/MINERALS CH
1.0000 | ORAL_TABLET | Freq: Every day | ORAL | Status: DC
  Administered 2016-04-08 – 2016-04-18 (×10): 1 via ORAL
  Filled 2016-04-07 (×10): qty 1

## 2016-04-07 MED ORDER — LEVOFLOXACIN 750 MG PO TABS
750.0000 mg | ORAL_TABLET | ORAL | Status: AC
  Administered 2016-04-07 – 2016-04-11 (×3): 750 mg via ORAL
  Filled 2016-04-07 (×4): qty 1

## 2016-04-07 MED ORDER — SIMETHICONE 80 MG PO CHEW
80.0000 mg | CHEWABLE_TABLET | Freq: Four times a day (QID) | ORAL | Status: DC | PRN
  Administered 2016-04-08 – 2016-04-10 (×3): 80 mg via ORAL
  Filled 2016-04-07 (×4): qty 1

## 2016-04-07 MED ORDER — GABAPENTIN 100 MG PO CAPS
100.0000 mg | ORAL_CAPSULE | Freq: Three times a day (TID) | ORAL | Status: DC | PRN
  Administered 2016-04-09 – 2016-04-15 (×5): 100 mg via ORAL
  Filled 2016-04-07 (×5): qty 1

## 2016-04-07 MED ORDER — HYDRALAZINE HCL 50 MG PO TABS
50.0000 mg | ORAL_TABLET | Freq: Three times a day (TID) | ORAL | Status: DC
  Administered 2016-04-07 – 2016-04-09 (×8): 50 mg via ORAL
  Filled 2016-04-07 (×8): qty 1

## 2016-04-07 MED ORDER — ACETAMINOPHEN 325 MG PO TABS
650.0000 mg | ORAL_TABLET | ORAL | Status: DC | PRN
  Administered 2016-04-08 – 2016-04-10 (×3): 650 mg via ORAL
  Filled 2016-04-07 (×3): qty 2

## 2016-04-07 MED ORDER — SODIUM CHLORIDE 0.9% FLUSH
3.0000 mL | Freq: Two times a day (BID) | INTRAVENOUS | Status: DC
  Administered 2016-04-07 – 2016-04-18 (×24): 3 mL via INTRAVENOUS

## 2016-04-07 MED ORDER — HYDROCODONE-ACETAMINOPHEN 5-325 MG PO TABS
1.0000 | ORAL_TABLET | Freq: Four times a day (QID) | ORAL | Status: DC | PRN
Start: 2016-04-07 — End: 2016-04-18
  Administered 2016-04-07 – 2016-04-18 (×21): 1 via ORAL
  Filled 2016-04-07 (×23): qty 1

## 2016-04-07 MED ORDER — SODIUM CHLORIDE 0.9% FLUSH: 3.0000 mL | INTRAVENOUS | Status: DC | PRN

## 2016-04-07 MED ORDER — ENSURE ENLIVE PO LIQD
237.0000 mL | Freq: Two times a day (BID) | ORAL | Status: DC
  Administered 2016-04-07 – 2016-04-18 (×17): 237 mL via ORAL

## 2016-04-07 MED ORDER — OXYCODONE-ACETAMINOPHEN 5-325 MG PO TABS
1.0000 | ORAL_TABLET | Freq: Once | ORAL | Status: AC
  Administered 2016-04-07: 1 via ORAL
  Filled 2016-04-07: qty 1

## 2016-04-07 MED ORDER — CARVEDILOL 6.25 MG PO TABS
6.2500 mg | ORAL_TABLET | Freq: Two times a day (BID) | ORAL | Status: DC
  Administered 2016-04-07 – 2016-04-13 (×13): 6.25 mg via ORAL
  Filled 2016-04-07 (×13): qty 1

## 2016-04-07 MED ORDER — GUAIFENESIN ER 600 MG PO TB12
1200.0000 mg | ORAL_TABLET | Freq: Two times a day (BID) | ORAL | Status: DC
  Administered 2016-04-07 – 2016-04-18 (×23): 1200 mg via ORAL
  Filled 2016-04-07 (×23): qty 2

## 2016-04-07 MED ORDER — ONDANSETRON HCL 4 MG/2ML IJ SOLN
4.0000 mg | Freq: Four times a day (QID) | INTRAMUSCULAR | Status: DC | PRN
  Administered 2016-04-09 – 2016-04-14 (×3): 4 mg via INTRAVENOUS
  Filled 2016-04-07 (×3): qty 2

## 2016-04-07 NOTE — Telephone Encounter (Signed)
Pt readmitted to [facility] today. Will reach out to pt for TCM once discharged.

## 2016-04-07 NOTE — Progress Notes (Addendum)
   04/07/16 1300  Clinical Encounter Type  Visited With Patient  Visit Type Initial  Referral From Other (Comment) (Spiritual Care Consult )  Recommendations (Social work)  Spiritual Encounters  Spiritual Needs Prayer  Stress Factors  Patient Stress Factors Health changes;Financial concerns   - Met w/ patient. - Introduced Art gallery manager - Pt. Communicated financial concerns/desire not to be placed in nursing home (may benefit from social work consult). - Prayed for health of son and pt.   Will follow, as needed.   - Rev. Waldo MDiv ThM

## 2016-04-07 NOTE — ED Notes (Signed)
Admitting at bedside 

## 2016-04-07 NOTE — Progress Notes (Signed)
PT Cancellation Note  Patient Details Name: Jaylan Hinojosa MRN: [id number] DOB: [birth year]   Cancelled Treatment:    Reason Eval/Treat Not Completed: Fatigue/lethargy limiting ability to participate;Pain limiting ability to participate (pt reports he's too sore and tired to do therapy today, he requested PT attempt tomorrow. He declined pain medication. Will follow. )   Philomena Doheny 04/07/2016, 10:09 AM [phone]

## 2016-04-07 NOTE — H&P (Signed)
History and Physical    Juan [name] RJJ:[id number] DOB: 12-30-1940 DOA: 04/06/2016  Referring MD/NP/PA: Dr. Leonides Schanz PCP: [name], MD  Patient coming from: Home  Chief Complaint: I'm sick HPI: Juan [name] is a 75 y.o. male with medical history significant of HTN, diastolic CHF, CAD, AAA, DM type II; who presents for complaints of worsening shortness of breath and pain all over. Patient was just hospitalized from 11/15 - 11/18 for community-acquired pneumonia and treated with Levaquin. Patient was discharged home on Levaquin, but notes that he is on a fixed income and couldn't afford to fill the prescription. The patient complains of productive cough with some clearish sputum. He reports that he's stiff and sore all over his body. Rates his pain a 9/10 on the pain scale. Symptoms worsened with any movement. Associated symptoms include subjective fevers, chills, diaphoresis, lower leg swelling, weight gain estimated 5-10 pounds, gait disturbance. At baseline patient does not require oxygen.  ED Course: Upon admission to the emergency department patient was seen to be afebrile, pulse 85 -107, respirations 17 - 22, O2 saturations as low as 89%, and blood pressure maintained. Patient was placed on 2 L nasal Oxygen with improvement of O2 saturation 96%. Lab work revealed WBC 13.2 with left shift, platelets 578, sodium 134, CO2 21, BUN 62, creatinine 2.22, and troponin 0.03. Chest x-ray showed cardiac enlargement with diffuse airspace disease suggesting edema. Patient was given 1 dose of Levaquin and swallow as 40 mg of Lasix IV.  Review of Systems: As per HPI otherwise 10 point review of systems negative.   Past Medical History:  Diagnosis Date  . Allergic rhinitis   . Anemia   . Ascending aortic aneurysm ([place]) 03/2014   4.3cm on CT scan  . CAD (coronary artery disease)    dx elsewheer in past, no documentation. Non-ischemic myovue 2007  . CHF (congestive heart failure) (HCC)    dx elsewhere, no  documentation. Normal EF by previous echo. Trival AS needs f/u ECHO by 2012  . Edema    R>L leg, u/s 5-12 neg for DVT  . Hemorrhoid   . History of thrombocytosis   . Hypertension   . Migraine    "once/wk at least" (07/11/2013)  . Myeloproliferative disease ([place])   . Shortness of breath   . Sinus congestion   . Sleep apnea, obstructive    at some point used CPAP, was d/c  years ago  . Type II diabetes mellitus ([place])     Past Surgical History:  Procedure Laterality Date  . TOE SURGERY Right    "tried to straighten out big toe" (07/11/2013)     reports that he quit smoking about 49 years ago. His smoking use included Cigarettes. He has a 3.00 pack-year smoking history. He has never used smokeless tobacco. He reports that he does not drink alcohol or use drugs.  No Known Allergies  Family History  Problem Relation Age of Onset  . Schizophrenia Son   . Colon cancer Neg Hx   . Prostate cancer Neg Hx   . Heart attack Neg Hx   . Diabetes Neg Hx     Prior to Admission medications   Medication Sig Start Date End Date Taking? Authorizing Provider  acetaminophen (TYLENOL) 500 MG tablet Take 500-1,000 mg by mouth every 6 (six) hours as needed for moderate pain or headache. Reported on 05/02/2015   Yes Historical Provider, MD  aspirin 325 MG tablet Take 1 tablet (325 mg total) by mouth daily. 04/16/14  Yes Ripudeep K Rai, MD  carvedilol (COREG) 6.25 MG tablet TAKE 1 TABLET BY MOUTH 2 TIMES DAILY. Patient taking differently: TAKE 12.5 MG BY MOUTH 2 TIMES DAILY. 03/10/16  Yes Shirley Friar, PA-C  feeding supplement, GLUCERNA SHAKE, (GLUCERNA SHAKE) LIQD Take 237 mLs by mouth as needed (protein). Reported on 08/21/2015   Yes Historical Provider, MD  ferrous gluconate (FERGON) 225 (27 Fe) MG tablet Take 240 mg by mouth daily at 10 pm.   Yes Historical Provider, MD  fluticasone (FLONASE) 50 MCG/ACT nasal spray Place 2 sprays into both nostrils daily. Reported on 05/02/2015   Yes  Historical Provider, MD  gabapentin (NEURONTIN) 100 MG capsule Take 100 mg by mouth 3 (three) times daily as needed (pain). Reported on 08/22/2015   Yes Historical Provider, MD  guaiFENesin (MUCINEX) 600 MG 12 hr tablet Take 2 tablets (1,200 mg total) by mouth 2 (two) times daily. 04/04/16  Yes Verlee Monte, MD  hydrALAZINE (APRESOLINE) 25 MG tablet TAKE 2 TABLETS BY MOUTH 3 TIMES DAILY. Patient taking differently: TAKE 50 MG BY MOUTH 3 TIMES DAILY. 12/03/15  Yes Amy D Clegg, NP  hydrocerin (EUCERIN) CREA Apply Eucerin cream to BLE Q day after bathing and roughly towel drying to remove loose skin 01/27/14  Yes Shanker Kristeen Mans, MD  HYDROcodone-acetaminophen (NORCO/VICODIN) 5-325 MG tablet Take 1 tablet by mouth every 6 (six) hours as needed for moderate pain. 04/04/16  Yes Verlee Monte, MD  isosorbide mononitrate (IMDUR) 30 MG 24 hr tablet TAKE 1 TABLET (30 MG TOTAL) BY MOUTH 2 (TWO) TIMES DAILY. Patient taking differently: Take 30 mg by mouth 2 (two) times daily as needed (for blood pressure).  03/10/16  Yes Amy D Clegg, NP  simethicone (MYLICON) 80 MG chewable tablet Chew 80 mg by mouth every 6 (six) hours as needed for flatulence.   Yes Historical Provider, MD  torsemide (DEMADEX) 20 MG tablet Take 4 tablets (80 mg total) by mouth daily. 03/14/15  Yes Jolaine Artist, MD  vitamin B-12 (CYANOCOBALAMIN) 100 MCG tablet Take 100 mcg by mouth daily.   Yes Historical Provider, MD  levofloxacin (LEVAQUIN) 750 MG tablet Take 1 tablet (750 mg total) by mouth daily. 04/04/16 04/09/16  Verlee Monte, MD    Physical Exam:  Constitutional: Elderly male who appears ill, but nontoxic Vitals:   04/07/16 0100 04/07/16 0115 04/07/16 0130 04/07/16 0145  BP: 126/77 130/73 126/69 112/60  Pulse: 100 96 107 94  Resp: '22 16 18 16  '$ Temp:      TempSrc:      SpO2: 96% 99% 98% 96%  Weight:      Height:       Eyes: PERRL, lids and conjunctivae normal ENMT: Mucous membranes are moist. Posterior pharynx clear of  any exudate or lesions.Normal dentition.  Neck: normal, supple, no masses, no thyromegaly Respiratory: Decreased overall air movement noted with crackles present. Normal respiratory effort. No accessory muscle use.  Cardiovascular: Regular rate and rhythm, no murmurs / rubs / gallops. 2+ pitting edema extremity edema. 2+ pedal pulses. No carotid bruits.  Abdomen: no tenderness, no masses palpated. No hepatosplenomegaly. Bowel sounds positive.  Musculoskeletal: no clubbing / cyanosis. No joint deformity upper and lower extremities. Good ROM, no contractures. Normal muscle tone.  Skin: no rashes, lesions, ulcers. No induration Neurologic: CN 2-12 grossly intact. Sensation intact, DTR normal. Strength 5/5 in all 4.  Psychiatric: Normal judgment and insight. Alert and oriented x 3. Normal mood.     Labs on Admission:  I have personally reviewed following labs and imaging studies  CBC:  Recent Labs Lab 04/01/16 0247 04/01/16 0826 04/02/16 0717 04/03/16 0504 04/04/16 0525 04/07/16 0010  WBC 10.7* 9.6 10.7* 9.5 11.4* 13.2*  NEUTROABS 6.5 4.9  --   --   --  6.9  HGB 8.6* 8.7* 8.7* 8.1* 9.4* 9.1*  HCT 27.7* 27.9* 28.0* 26.0* 30.7* 28.2*  MCV 80.3 80.6 80.2 80.7 81.2 78.3  PLT 359 344 373 338 474* 867*   Basic Metabolic Panel:  Recent Labs Lab 04/01/16 0826 04/02/16 0717 04/03/16 0504 04/04/16 0525 04/07/16 0010  NA 138 137 136 137 134*  K 4.0 4.2 4.0 4.5 4.1  CL 105 107 105 104 103  CO2 '24 23 23 23 '$ 21*  GLUCOSE 110* 102* 125* 107* 120*  BUN 51* 46* 45* 48* 62*  CREATININE 1.60* 1.48* 1.35* 1.77* 2.22*  CALCIUM 9.5 9.9 9.5 10.1 9.7   GFR: Estimated Creatinine Clearance: 34.9 mL/min (by C-G formula based on SCr of 2.22 mg/dL (H)). Liver Function Tests:  Recent Labs Lab 04/01/16 0247 04/01/16 0826 04/07/16 0010  AST '27 30 31  '$ ALT 14* 14* 12*  ALKPHOS 81 78 76  BILITOT 0.5 0.4 0.3  PROT 6.3* 5.9* 6.3*  ALBUMIN 3.3* 3.0* 3.0*   No results for input(s): LIPASE,  AMYLASE in the last 168 hours. No results for input(s): AMMONIA in the last 168 hours. Coagulation Profile: No results for input(s): INR, PROTIME in the last 168 hours. Cardiac Enzymes:  Recent Labs Lab 04/01/16 0826 04/01/16 1359 04/01/16 2048 04/07/16 0010  TROPONINI <0.03 <0.03 <0.03 0.03*   BNP (last 3 results) No results for input(s): PROBNP in the last 8760 hours. HbA1C: No results for input(s): HGBA1C in the last 72 hours. CBG:  Recent Labs Lab 04/03/16 1151 04/03/16 1616 04/03/16 2022 04/04/16 0811 04/04/16 1151  GLUCAP 123* 126* 116* 99 94   Lipid Profile: No results for input(s): CHOL, HDL, LDLCALC, TRIG, CHOLHDL, LDLDIRECT in the last 72 hours. Thyroid Function Tests: No results for input(s): TSH, T4TOTAL, FREET4, T3FREE, THYROIDAB in the last 72 hours. Anemia Panel: No results for input(s): VITAMINB12, FOLATE, FERRITIN, TIBC, IRON, RETICCTPCT in the last 72 hours. Urine analysis:    Component Value Date/Time   COLORURINE YELLOW 04/01/2016 0240   APPEARANCEUR CLEAR 04/01/2016 0240   LABSPEC 1.011 04/01/2016 0240   PHURINE 6.0 04/01/2016 0240   GLUCOSEU NEGATIVE 04/01/2016 0240   GLUCOSEU NEGATIVE 05/02/2015 1008   HGBUR NEGATIVE 04/01/2016 0240   BILIRUBINUR NEGATIVE 04/01/2016 0240   KETONESUR NEGATIVE 04/01/2016 0240   PROTEINUR 100 (A) 04/01/2016 0240   UROBILINOGEN 0.2 05/02/2015 1008   NITRITE NEGATIVE 04/01/2016 0240   LEUKOCYTESUR NEGATIVE 04/01/2016 0240   Sepsis Labs: Recent Results (from the past 240 hour(s))  Urine culture     Status: None   Collection Time: 04/01/16  2:40 AM  Result Value Ref Range Status   Specimen Description URINE, RANDOM  Final   Special Requests NONE  Final   Culture NO GROWTH  Final   Report Status 04/02/2016 FINAL  Final  Blood culture (routine x 2)     Status: None   Collection Time: 04/01/16  2:42 AM  Result Value Ref Range Status   Specimen Description BLOOD RIGHT ARM  Final   Special Requests BOTTLES  DRAWN AEROBIC AND ANAEROBIC 5ML  Final   Culture NO GROWTH 5 DAYS  Final   Report Status 04/06/2016 FINAL  Final  Blood culture (routine x 2)  Status: None   Collection Time: 04/01/16  4:26 AM  Result Value Ref Range Status   Specimen Description BLOOD LEFT HAND  Final   Special Requests IN PEDIATRIC BOTTLE 4ML  Final   Culture NO GROWTH 5 DAYS  Final   Report Status 04/06/2016 FINAL  Final  Culture, sputum-assessment     Status: None   Collection Time: 04/01/16  8:42 AM  Result Value Ref Range Status   Specimen Description EXPECTORATED SPUTUM  Final   Special Requests NONE  Final   Sputum evaluation   Final    THIS SPECIMEN IS ACCEPTABLE. RESPIRATORY CULTURE REPORT TO FOLLOW.   Report Status 04/01/2016 FINAL  Final  Culture, respiratory (NON-Expectorated)     Status: None   Collection Time: 04/01/16  8:42 AM  Result Value Ref Range Status   Specimen Description EXPECTORATED SPUTUM  Final   Special Requests NONE  Final   Gram Stain   Final    NO WBC SEEN MODERATE GRAM POSITIVE COCCI IN PAIRS IN SINGLES MODERATE GRAM NEGATIVE RODS MODERATE GRAM VARIABLE ROD MODERATE GRAM POSITIVE RODS MODERATE YEAST FEW SQUAMOUS EPITHELIAL CELLS PRESENT    Culture Consistent with normal respiratory flora.  Final   Report Status 04/03/2016 FINAL  Final     Radiological Exams on Admission: Dg Chest 2 View  Result Date: 04/06/2016 CLINICAL DATA:  Shortness of breath. Sore back radiating to both flanks. Discharge from [facility] on Saturday with diagnosis of pneumonia. Continued fever and chills. EXAM: CHEST  2 VIEW COMPARISON:  04/01/2016 FINDINGS: Shallow inspiration with linear atelectasis in the lung bases. Cardiac enlargement. Diffuse airspace opacities in the lungs consistent with edema or multifocal pneumonia. Similar appearance to previous study. No blunting of costophrenic angles. No pneumothorax. Calcified aorta. Thoracic scoliosis convex towards the right. IMPRESSION: Cardiac  enlargement. Diffuse airspace disease suggesting edema or pneumonia. Electronically Signed   By: Lucienne Capers M.D.   On: 04/06/2016 23:55    EKG: Independently reviewed. Sinus rhythm with prolonged PR interval similar to previous tracings.  Assessment/Plan Suspected CHF exacerbation: Acute. Patient presents with shortness of breath and reported weight gain. Chest x-ray showing cardiac enlargement with diffuse airspace disease suggestive of edema. BNP mildly elevated at 173.1. Patient given 40 mg of Lasix IV in the ED. Previous echocardiogram in 12/2015 reveals EF of 55-60% with grade 1 diastolic dysfunction. Patient at higher BMPs past that this is not a clear diagnosis of CHF. - Admit to a telemetry bed  - Strict ins and outs and daily weights - Reassess in a.m. for continued need of IV diuresis - Continue Coreg, isosorbide mononitrate, hydralazine - May warrant consultation to cardiology in a.m.   Acute renal failure on chronic kidney disease: Baseline creatinine between 1.35- 1.89. On admission creatinine elevated to 2.22 with BUN of 62. - Recheck BMP in a.m.  Community-acquired pneumonia: Patient was diagnosed with community-acquired pneumonia treated with Levaquin, but did not fill the prescription at discharge due to cost. Patient has missed 2 days worth of doses. - Continue Levaquin  Elevated troponin: Acute. Initial troponin 0.03 - Trend cardiac troponins overnight   Leukocytosis: Acute. Prior to discharge WBC 11.4 and on admission tonight WBC elevated to 13.2. - Recheck CBC in a.m - Check urinalysis  Thrombocytosis platelet count elevated at 578 on admission. Suspect this is reactive in nature. - Recheck platelet count in a.m.  Thoracic aortic aneurysm: Previously noted to be stable last CT scan  Anemia:Patient near baseline hemoglobin 8-9 -  Continue to monitor  Chronic pain  - Continue Hydrocodone prn  DVT prophylaxis:  heparin   Code Status: Full  Family  Communication: No family present at bedside  Disposition Plan: TBD Consults called: None Admission status: Observation  Norval Morton MD [facility] Pager [phone]  If 7PM-7AM, please contact night-coverage [email or web address] Password [email or web address]  04/07/2016, 2:18 AM

## 2016-04-07 NOTE — ED Provider Notes (Signed)
12:20 AM  Assumed care from Dr. Roxanne Mins.  Pt is a 75 y.o. M who was admitted to the hospital on 04/01/16 for CAP.  Dc'ed on 11/18 on Levaquin which she could not afford. Presents today with shortness of breath and new oxygen requirement.  Patient received oral Levaquin here. Labs pending. Chest x-ray shows diffuse pneumonia versus CHF. Patient will need admission.    EKG Interpretation  Date/Time:  Monday April 06 2016 23:18:40 EST Ventricular Rate:  85 PR Interval:    QRS Duration: 106 QT Interval:  357 QTC Calculation: 425 R Axis:   -28 Text Interpretation:  Sinus rhythm Borderline prolonged PR interval Borderline left axis deviation Baseline wander in lead(s) V 2 When compared with ECG of 12/30/2015, No significant change was found Confirmed by [facility]  MD, DAVID ([id number]) on 04/06/2016 11:26:39 PM      2:00 AM  Pt's Labs show slightly worsening leukocytosis with left shift. Acute on chronic renal failure. Normal lactate. BNP mildly elevated at 137. Troponin negative. He has no chest pain. Reports increased lower extremity swelling or the past several days. Reports weight gain.He has JVD and pitting eczema on exam. I suspect that he is volume overloaded and we'll give IV Lasix. I am aware that he does have slightly acute on chronic renal failure but I still feel that diuresis is indicated. He had a CT scan of his chest on November 15 which showed pulmonary edema. We'll discuss with medicine for readmission given his new oxygen requirement. Updated patient with this plan.   2:15 AM  Discussed patient's case with Hospitalist, Dr. Tamala Julian.  Recommend admission to telemetry, observation bed.  I will place holding orders per their request. Patient and family (if present) updated with plan. Care transferred to hospitalist service.  I reviewed all nursing notes, vitals, pertinent old records, EKGs, labs, imaging (as available).    [place], DO 04/07/16 [phone]

## 2016-04-07 NOTE — Progress Notes (Signed)
New pt admission from ED. Pt brought to the unit in stable condition. Vitals taken. Initial Assessment done. All immediate pertinent needs to patient addressed. Patient Guide given to patient. Important safety instructions relating to hospitalization reviewed with patient. Patient verbalized understanding. This time pt has a complain of Generalized weakness and refused to get out of the bed for weight either, has BLLE edema 2+, oxygen continue via [place] '@2l'$ , Sat is maintained at 98%, Will continue to monitor pt.

## 2016-04-07 NOTE — Evaluation (Signed)
Physical Therapy Evaluation Patient Details Name: Juan [name] MRN: [id number] DOB: April 28, 1941 Today's Date: 04/07/2016   History of Present Illness  75 y.o. male with recent hospitalization 11/15-11/18/17 for PNA re-admitted with CHF exacerbation. PMH of CAD, DM, CHF, chronic pain, HTN.   Clinical Impression  Pt admitted with above diagnosis. Pt currently with functional limitations due to the deficits listed below (see PT Problem List). Much encouragement required for pt to participate in PT. Max assist for sit to stand, pt stood ~25 seconds with RW, tolerance limited by fatigue. ST-SNF recommended.  Pt will benefit from skilled PT to increase their independence and safety with mobility to allow discharge to the venue listed below.       Follow Up Recommendations SNF    Equipment Recommendations  None recommended by PT    Recommendations for Other Services       Precautions / Restrictions Precautions Precautions: Fall Restrictions Weight Bearing Restrictions: No      Mobility  Bed Mobility               General bed mobility comments: pt up in recliner  Transfers Overall transfer level: Needs assistance Equipment used: Rolling walker (2 wheeled) Transfers: Sit to/from Stand Sit to Stand: Max assist         General transfer comment: pt 50%, assist to rise, VCs for hand placement, increased time and max encouragement to participate. Pt stood for 25 sec with RW, tolerance limited by fatigue. HR 80s with activity.   Ambulation/Gait             General Gait Details: NT- pt fatigued with sit to stand  Stairs            Wheelchair Mobility    Modified Rankin (Stroke Patients Only)       Balance     Sitting balance-Leahy Scale: Fair       Standing balance-Leahy Scale: Poor Standing balance comment: relies on BUE support from RW                             Pertinent Vitals/Pain      Home Living Family/patient expects to be  discharged to:: Private residence Living Arrangements: Children Available Help at Discharge: Family;Available 24 hours/day Type of Home: Apartment Home Access: Level entry     Home Layout: One level Home Equipment: Walker - 2 wheels;Cane - single point;Shower seat;Bedside commode;Wheelchair - manual Additional Comments: pt lives with his son    Prior Function Level of Independence: Needs assistance   Gait / Transfers Assistance Needed: pt ambulated with RW, used WC for long distances  ADL's / Homemaking Assistance Needed: Pt's son performs housekeeping and cooking. Pt states he is able to bathe, dress and toilet himself at baseline.         Hand Dominance   Dominant Hand: Right    Extremity/Trunk Assessment   Upper Extremity Assessment: Defer to OT evaluation           Lower Extremity Assessment: RLE deficits/detail;LLE deficits/detail RLE Deficits / Details: knee ext -15* AROM, +3/5 knee ext, ankle AROM WNL, hip flexion 2/5 LLE Deficits / Details: knee ext -30* AROM, +2/5 strength, ankle AROM WNL, hip flexion 2/5  Cervical / Trunk Assessment: Normal  Communication   Communication: No difficulties  Cognition Arousal/Alertness: Awake/alert Behavior During Therapy: WFL for tasks assessed/performed Overall Cognitive Status: Within Functional Limits for tasks assessed  General Comments      Exercises     Assessment/Plan    PT Assessment Patient needs continued PT services  PT Problem List Decreased strength;Decreased activity tolerance;Decreased balance;Decreased mobility;Decreased knowledge of use of DME          PT Treatment Interventions DME instruction;Gait training;Functional mobility training;Balance training;Therapeutic exercise;Therapeutic activities;Patient/family education    PT Goals (Current goals can be found in the Care Plan section)  Acute Rehab PT Goals Patient Stated Goal: to get stronger PT Goal Formulation:  With patient Time For Goal Achievement: 04/21/16 Potential to Achieve Goals: Good    Frequency Min 3X/week   Barriers to discharge        Co-evaluation               End of Session Equipment Utilized During Treatment: Gait belt Activity Tolerance: Patient limited by fatigue Patient left: in chair;with call [place]/phone within reach Nurse Communication: Mobility status    Functional Assessment Tool Used: clinical judgement Functional Limitation: Mobility: Walking and moving around Mobility: Walking and Moving Around Current Status [phone]): At least 60 percent but less than 80 percent impaired, limited or restricted Mobility: Walking and Moving Around Goal Status [phone]): At least 20 percent but less than 40 percent impaired, limited or restricted    Time: 1338-1401 PT Time Calculation (min) (ACUTE ONLY): 23 min   Charges:   PT Evaluation $PT Eval Moderate Complexity: 1 Procedure PT Treatments $Therapeutic Activity: 8-22 mins   PT G Codes:   PT G-Codes **NOT FOR INPATIENT CLASS** Functional Assessment Tool Used: clinical judgement Functional Limitation: Mobility: Walking and moving around Mobility: Walking and Moving Around Current Status ([id number]): At least 60 percent but less than 80 percent impaired, limited or restricted Mobility: Walking and Moving Around Goal Status [phone]): At least 20 percent but less than 40 percent impaired, limited or restricted    Philomena Doheny 04/07/2016, 2:10 PM [phone]

## 2016-04-07 NOTE — Progress Notes (Signed)
CRITICAL VALUE ALERT  Critical value received:  Troponin 0.1  Date of notification:  04/07/16  Time of notification:  1303  Critical value read back:Yes.    Nurse who received alert:  Waynetta Sandy, RN  MD notified (1st page):  Dr. Ree Kida  Time of first page:  1304  MD notified (2nd page):  Time of second page:  Responding MD:  Dr. Ree Kida  Time MD responded:  [phone]

## 2016-04-07 NOTE — Care Management Note (Addendum)
Case Management Note  Patient Details  Name: Juan [name] MRN: [id number] Date of Birth: 1940/07/26  Subjective/Objective:     Admitted to Observation for CHF              Action/Plan: Patient recently discharged with pneumonia 2 days ago but did not fill his prescription for antibiotic. Pharmacy of choice is [facility]; Patient stated "It cost over $100, and I do not have it;" CM asked the patient if he notified his doctor? Patient stated "No." CM talked to patient about getting a Drug plan to pay for his medication, he has Medicare A&B only. Patient stated " I don't plan to because I don't want them all in my business." He lives at home with his son; his daughter takes him to his apts. He does not plan to apply to [employer] ( for transportation needs); is active with [facility] for HHRN,PT and aide; DME - walker, cane;  Very Difficult situation - patient needs a prescription drug plan to cover the cost of his medication but he is refusing to apply due to privacy reasons. He wants to lie in the hospital bed and not move because he stated the he is sore from pneumonia. CM talked to patient at length about working with physical therapy to obtain a clear discharge plan. Patient does not want to go to a nursing facility; he was at [facility] and had to play a lot of money after his stay. He does not wish to go to SNF and wants to return home with [facility] services. Patient promised CM that he would work with physical therapy after lunch today. CM paged PT, awaiting a call back. Patient was also reminded that he is in the hospital under 24 hr Observation and might be discharged back home tomorrow. Medicare will not pay for "soreness," we have to be actively treating him/ intensity of service; Lots of emotional support given. CM will continue to follow for DCP.  04/07/2016- Patient agreed to go to SNF after working with physical therapy - pt can barely walk or stand without assistance. [place] Sewer Worker made  aware.  Expected Discharge Date:     Possibly 04/08/2016             Expected Discharge Plan:  [place]  In-House Referral:   [place]  Discharge planning Services  CM Consult Choice offered to:  Patient  HH Arranged:  RN, PT, Nurse's Aide [place] Agency:  [place]  Status of Service:  In process, will continue to follow  Royston Bake McC[place]ill ,MHA,BSN [phone] 04/07/2016, 10:48 AM

## 2016-04-07 NOTE — Care Management Obs Status (Signed)
[place] NOTIFICATION   Patient Details  Name: Juan [name] MRN: [id number] Date of Birth: [birth year]   Medicare Observation Status Notification Given:  Yes    Royston Bake, RN 04/07/2016, 10:48 AM

## 2016-04-07 NOTE — Telephone Encounter (Signed)
thx

## 2016-04-07 NOTE — Progress Notes (Addendum)
Patient admitted earlier today by Dr. Fuller Plan. Please see full H&P for details.  This is a 75 year old male with a history of hypertension, heart failure, CAD, diabetes who presented with complaints of worsening shortness of breath and increased pain all over. Patient was recently admitted for community-acquired pneumonia from 04/01/2016 to 04/04/2016. He was discharged with a prescription for Levaquin however did not obtain this prescription as he could not afford it. I asked patient why he did not call the hospital for another prescription, he said he was "too sore."   Patient readmitted for community-acquired pneumonia and restarted on Levaquin. Also noted to have mild acute kidney injury however GFR still in stage III. Patient thought to have CHF exacerbation on admission however his BNP although elevated at 173 is lower than previous BNPs.  Not entirely sure the patient's complaints are truly valid. It seems that last hospital admission, patient had an excuse for not wanting to leave the hospital and was trying to bargain with the physician at that time. He was also discharged with home health PT and OT and does not wish to go to a nursing facility. Patient was told that he could be helped with transportation needs however declined that as well.  Currently on my examination, patient is not short of breath, vital signs are stable. Lung sounds are diminished anteriorly as patient would not sit up to allow me to listen to his lungs posteriorly. Heart regular rate and rhythm.  Case management consulted and appreciated. Please see case management notes. Physical therapy consulted and appreciated.   Also noted to have troponin at 0.1, with no complaints of chest pain. Will continue to cycle.   Time spent: 20 minutes  Juan [name] D.O. [facility] Pager [phone]  If 7PM-7AM, please contact night-coverage [email or web address] Password [facility] 04/07/2016, 12:17 PM

## 2016-04-07 NOTE — Progress Notes (Signed)
Initial Nutrition Assessment   INTERVENTION:  Provide Ensure Enlive po BID, each supplement provides 350 kcal and 20 grams of protein   NUTRITION DIAGNOSIS:   Increased nutrient needs related to chronic illness, wound healing as evidenced by estimated needs, moderate depletions of muscle mass.   GOAL:   Patient will meet greater than or equal to 90% of their needs   MONITOR:   PO intake, Supplement acceptance, Labs, Weight trends, Skin, I & O's  REASON FOR ASSESSMENT:   Malnutrition Screening Tool    ASSESSMENT:   75 year old male with a history of hypertension, heart failure, CAD, diabetes who presented with complaints of worsening shortness of breath and increased pain all over. Patient was recently admitted for community-acquired pneumonia from 04/01/2016 to 04/04/2016. He was discharged with a prescription for Levaquin however did not obtain this prescription as he could not afford it.   Pt familiar to nutrition team from previous admissions. Pt states that he continues to have a poor appetite and is only eating 50% of meals. RD discussed the importance of nutrition for wound healing. Pt agreeable to receiving Ensure supplements. Pt's weight is stable from weigh in August per weight history; however, pt does have moderate muscle wasting per nutrition-focused physical exam.   Labs: high BUN, elevated creatinine, low GFR, low hemoglobin  Diet Order:  Diet Heart Room service appropriate? Yes; Fluid consistency: Thin  Skin:  Wound (see comment) (Stage II pressure ulcer on buttocks)  Last BM:  11/19  Height:   Ht Readings from Last 1 Encounters:  04/07/16 '6\' 3"'$  (1.905 m)    Weight:   Wt Readings from Last 1 Encounters:  04/07/16 197 lb 14.4 oz (89.8 kg)    Ideal Body Weight:  89.09 kg  BMI:  Body mass index is 24.74 kg/m.  Estimated Nutritional Needs:   Kcal:  4128-7867  Protein:  110-120 grams  Fluid:  per MD  EDUCATION NEEDS:   No education needs  identified at this time  [place], CSP, LDN Inpatient Clinical Dietitian Pager: [phone] After Hours Pager: [phone]

## 2016-04-08 DIAGNOSIS — I130 Hypertensive heart and chronic kidney disease with heart failure and stage 1 through stage 4 chronic kidney disease, or unspecified chronic kidney disease: Secondary | ICD-10-CM | POA: Diagnosis present

## 2016-04-08 DIAGNOSIS — N183 Chronic kidney disease, stage 3 (moderate): Secondary | ICD-10-CM | POA: Diagnosis not present

## 2016-04-08 DIAGNOSIS — J9600 Acute respiratory failure, unspecified whether with hypoxia or hypercapnia: Secondary | ICD-10-CM | POA: Diagnosis present

## 2016-04-08 DIAGNOSIS — D473 Essential (hemorrhagic) thrombocythemia: Secondary | ICD-10-CM | POA: Diagnosis not present

## 2016-04-08 DIAGNOSIS — C946 Myelodysplastic disease, not classified: Secondary | ICD-10-CM | POA: Diagnosis present

## 2016-04-08 DIAGNOSIS — M6281 Muscle weakness (generalized): Secondary | ICD-10-CM | POA: Diagnosis not present

## 2016-04-08 DIAGNOSIS — J80 Acute respiratory distress syndrome: Secondary | ICD-10-CM | POA: Diagnosis not present

## 2016-04-08 DIAGNOSIS — Z87891 Personal history of nicotine dependence: Secondary | ICD-10-CM | POA: Diagnosis not present

## 2016-04-08 DIAGNOSIS — Z515 Encounter for palliative care: Secondary | ICD-10-CM | POA: Diagnosis not present

## 2016-04-08 DIAGNOSIS — I129 Hypertensive chronic kidney disease with stage 1 through stage 4 chronic kidney disease, or unspecified chronic kidney disease: Secondary | ICD-10-CM | POA: Diagnosis not present

## 2016-04-08 DIAGNOSIS — N400 Enlarged prostate without lower urinary tract symptoms: Secondary | ICD-10-CM | POA: Diagnosis not present

## 2016-04-08 DIAGNOSIS — D509 Iron deficiency anemia, unspecified: Secondary | ICD-10-CM | POA: Diagnosis not present

## 2016-04-08 DIAGNOSIS — G43909 Migraine, unspecified, not intractable, without status migrainosus: Secondary | ICD-10-CM | POA: Diagnosis present

## 2016-04-08 DIAGNOSIS — I2510 Atherosclerotic heart disease of native coronary artery without angina pectoris: Secondary | ICD-10-CM | POA: Diagnosis present

## 2016-04-08 DIAGNOSIS — N1330 Unspecified hydronephrosis: Secondary | ICD-10-CM | POA: Diagnosis not present

## 2016-04-08 DIAGNOSIS — M25531 Pain in right wrist: Secondary | ICD-10-CM | POA: Diagnosis not present

## 2016-04-08 DIAGNOSIS — I5023 Acute on chronic systolic (congestive) heart failure: Secondary | ICD-10-CM | POA: Diagnosis not present

## 2016-04-08 DIAGNOSIS — E1140 Type 2 diabetes mellitus with diabetic neuropathy, unspecified: Secondary | ICD-10-CM | POA: Diagnosis present

## 2016-04-08 DIAGNOSIS — N170 Acute kidney failure with tubular necrosis: Secondary | ICD-10-CM | POA: Diagnosis not present

## 2016-04-08 DIAGNOSIS — N1339 Other hydronephrosis: Secondary | ICD-10-CM | POA: Diagnosis not present

## 2016-04-08 DIAGNOSIS — M109 Gout, unspecified: Secondary | ICD-10-CM | POA: Diagnosis not present

## 2016-04-08 DIAGNOSIS — Z818 Family history of other mental and behavioral disorders: Secondary | ICD-10-CM | POA: Diagnosis not present

## 2016-04-08 DIAGNOSIS — J9601 Acute respiratory failure with hypoxia: Secondary | ICD-10-CM | POA: Diagnosis not present

## 2016-04-08 DIAGNOSIS — D72829 Elevated white blood cell count, unspecified: Secondary | ICD-10-CM | POA: Diagnosis not present

## 2016-04-08 DIAGNOSIS — E1129 Type 2 diabetes mellitus with other diabetic kidney complication: Secondary | ICD-10-CM | POA: Diagnosis not present

## 2016-04-08 DIAGNOSIS — J989 Respiratory disorder, unspecified: Secondary | ICD-10-CM | POA: Diagnosis not present

## 2016-04-08 DIAGNOSIS — R0902 Hypoxemia: Secondary | ICD-10-CM | POA: Diagnosis not present

## 2016-04-08 DIAGNOSIS — Z7189 Other specified counseling: Secondary | ICD-10-CM | POA: Diagnosis not present

## 2016-04-08 DIAGNOSIS — N320 Bladder-neck obstruction: Secondary | ICD-10-CM | POA: Diagnosis not present

## 2016-04-08 DIAGNOSIS — E119 Type 2 diabetes mellitus without complications: Secondary | ICD-10-CM | POA: Diagnosis not present

## 2016-04-08 DIAGNOSIS — R488 Other symbolic dysfunctions: Secondary | ICD-10-CM | POA: Diagnosis not present

## 2016-04-08 DIAGNOSIS — N179 Acute kidney failure, unspecified: Secondary | ICD-10-CM | POA: Diagnosis not present

## 2016-04-08 DIAGNOSIS — I5033 Acute on chronic diastolic (congestive) heart failure: Secondary | ICD-10-CM | POA: Diagnosis not present

## 2016-04-08 DIAGNOSIS — G8929 Other chronic pain: Secondary | ICD-10-CM | POA: Diagnosis present

## 2016-04-08 DIAGNOSIS — R278 Other lack of coordination: Secondary | ICD-10-CM | POA: Diagnosis not present

## 2016-04-08 DIAGNOSIS — I471 Supraventricular tachycardia: Secondary | ICD-10-CM | POA: Diagnosis not present

## 2016-04-08 DIAGNOSIS — Z7951 Long term (current) use of inhaled steroids: Secondary | ICD-10-CM | POA: Diagnosis not present

## 2016-04-08 DIAGNOSIS — I5043 Acute on chronic combined systolic (congestive) and diastolic (congestive) heart failure: Secondary | ICD-10-CM | POA: Diagnosis present

## 2016-04-08 DIAGNOSIS — E875 Hyperkalemia: Secondary | ICD-10-CM | POA: Diagnosis not present

## 2016-04-08 DIAGNOSIS — N184 Chronic kidney disease, stage 4 (severe): Secondary | ICD-10-CM | POA: Diagnosis present

## 2016-04-08 DIAGNOSIS — N2581 Secondary hyperparathyroidism of renal origin: Secondary | ICD-10-CM | POA: Diagnosis not present

## 2016-04-08 DIAGNOSIS — D471 Chronic myeloproliferative disease: Secondary | ICD-10-CM | POA: Diagnosis not present

## 2016-04-08 DIAGNOSIS — D649 Anemia, unspecified: Secondary | ICD-10-CM | POA: Diagnosis not present

## 2016-04-08 DIAGNOSIS — E1122 Type 2 diabetes mellitus with diabetic chronic kidney disease: Secondary | ICD-10-CM | POA: Diagnosis present

## 2016-04-08 DIAGNOSIS — I712 Thoracic aortic aneurysm, without rupture: Secondary | ICD-10-CM | POA: Diagnosis not present

## 2016-04-08 DIAGNOSIS — I509 Heart failure, unspecified: Secondary | ICD-10-CM | POA: Diagnosis not present

## 2016-04-08 DIAGNOSIS — J189 Pneumonia, unspecified organism: Secondary | ICD-10-CM | POA: Diagnosis not present

## 2016-04-08 DIAGNOSIS — R3914 Feeling of incomplete bladder emptying: Secondary | ICD-10-CM | POA: Diagnosis not present

## 2016-04-08 DIAGNOSIS — C7951 Secondary malignant neoplasm of bone: Secondary | ICD-10-CM | POA: Diagnosis not present

## 2016-04-08 DIAGNOSIS — N132 Hydronephrosis with renal and ureteral calculous obstruction: Secondary | ICD-10-CM | POA: Diagnosis present

## 2016-04-08 DIAGNOSIS — D7581 Myelofibrosis: Secondary | ICD-10-CM | POA: Diagnosis present

## 2016-04-08 DIAGNOSIS — R293 Abnormal posture: Secondary | ICD-10-CM | POA: Diagnosis not present

## 2016-04-08 DIAGNOSIS — N189 Chronic kidney disease, unspecified: Secondary | ICD-10-CM | POA: Diagnosis not present

## 2016-04-08 DIAGNOSIS — N323 Diverticulum of bladder: Secondary | ICD-10-CM | POA: Diagnosis not present

## 2016-04-08 DIAGNOSIS — E46 Unspecified protein-calorie malnutrition: Secondary | ICD-10-CM | POA: Diagnosis present

## 2016-04-08 DIAGNOSIS — G4733 Obstructive sleep apnea (adult) (pediatric): Secondary | ICD-10-CM | POA: Diagnosis present

## 2016-04-08 DIAGNOSIS — M8580 Other specified disorders of bone density and structure, unspecified site: Secondary | ICD-10-CM | POA: Diagnosis not present

## 2016-04-08 DIAGNOSIS — R262 Difficulty in walking, not elsewhere classified: Secondary | ICD-10-CM | POA: Diagnosis not present

## 2016-04-08 DIAGNOSIS — Z7982 Long term (current) use of aspirin: Secondary | ICD-10-CM | POA: Diagnosis not present

## 2016-04-08 LAB — CBC
HEMATOCRIT: 25.5 % — AB (ref 39.0–52.0)
HEMOGLOBIN: 8.1 g/dL — AB (ref 13.0–17.0)
MCH: 24.8 pg — AB (ref 26.0–34.0)
MCHC: 31.8 g/dL (ref 30.0–36.0)
MCV: 78 fL (ref 78.0–100.0)
Platelets: 484 10*3/uL — ABNORMAL HIGH (ref 150–400)
RBC: 3.27 MIL/uL — AB (ref 4.22–5.81)
RDW: 18.8 % — ABNORMAL HIGH (ref 11.5–15.5)
WBC: 10.5 10*3/uL (ref 4.0–10.5)

## 2016-04-08 LAB — BASIC METABOLIC PANEL
ANION GAP: 10 (ref 5–15)
BUN: 59 mg/dL — ABNORMAL HIGH (ref 6–20)
CALCIUM: 9.3 mg/dL (ref 8.9–10.3)
CHLORIDE: 103 mmol/L (ref 101–111)
CO2: 22 mmol/L (ref 22–32)
Creatinine, Ser: 2.02 mg/dL — ABNORMAL HIGH (ref 0.61–1.24)
GFR calc Af Amer: 36 mL/min — ABNORMAL LOW (ref >60)
GFR calc non Af Amer: 31 mL/min — ABNORMAL LOW (ref >60)
GLUCOSE: 105 mg/dL — AB (ref 65–99)
Potassium: 4 mmol/L (ref 3.5–5.1)
Sodium: 135 mmol/L (ref 135–145)

## 2016-04-08 LAB — PROCALCITONIN: PROCALCITONIN: 0.43 ng/mL

## 2016-04-08 MED ORDER — MUSCLE RUB 10-15 % EX CREA
TOPICAL_CREAM | CUTANEOUS | Status: DC | PRN
  Administered 2016-04-08 – 2016-04-17 (×2): via TOPICAL
  Filled 2016-04-08: qty 85

## 2016-04-08 MED ORDER — FUROSEMIDE 10 MG/ML IJ SOLN
40.0000 mg | Freq: Two times a day (BID) | INTRAMUSCULAR | Status: DC
  Administered 2016-04-08 – 2016-04-09 (×3): 40 mg via INTRAVENOUS
  Filled 2016-04-08 (×3): qty 4

## 2016-04-08 NOTE — Clinical Social Work Note (Signed)
Clinical Social Work Assessment  Patient Details  Name: Juan [name] MRN: [id number] Date of Birth: 20-Apr-1941  Date of referral:  04/08/16               Reason for consult:  Facility Placement, Discharge Planning                Permission sought to share information with:  Facility Sport and exercise psychologist, Family Supports Permission granted to share information::  Yes, Verbal Permission Granted  Name::        Agency::  SNF's  Relationship::     Contact Information:     Housing/Transportation Living arrangements for the past 2 months:  Apartment Source of Information:  Patient, Medical Team Patient Interpreter Needed:  None Criminal Activity/Legal Involvement Pertinent to Current Situation/Hospitalization:  No - Comment as needed Significant Relationships:  Adult Children Lives with:  Adult Children Do you feel safe going back to the place where you live?  Yes Need for family participation in patient care:  Yes (Comment)  Care giving concerns:  PT recommending SNF once medically stable.   Social Worker assessment / plan:  CSW met with patient. No supports at bedside. CSW introduced role and explained that PT recommendations would be discussed. Patient agreeable to SNF but wants to be at one with a bus stop close by so that his son can visit every few days. This is noted in the FL2. Per MD, patient will discharge in 1-2 days. No further concerns. CSW encouraged patient to contact CSW as needed. CSW will continue to follow patient for support and facilitate discharge to SNF once medically stable.  Employment status:  Retired Forensic scientist:  Medicare PT Recommendations:  [place] / Referral to community resources:  [place]  Patient/Family's Response to care:  Patient agreeable to SNF placement. Patient's children supportive and involved in patient's care. Patient appreciated social work intervention.  Patient/Family's Understanding of  and Emotional Response to Diagnosis, Current Treatment, and Prognosis:  Patient understands and is agreeable to the discharge plan. Patient appears happy with hospital care.  Emotional Assessment Appearance:  Appears stated age Attitude/Demeanor/Rapport:  Lethargic Affect (typically observed):  Appropriate, Calm Orientation:  Oriented to Self, Oriented to Place, Oriented to  Time, Oriented to Situation Alcohol / Substance use:  Never Used Psych involvement (Current and /or in the community):  No (Comment)  Discharge Needs  Concerns to be addressed:  Care Coordination Readmission within the last 30 days:  Yes Current discharge risk:  Dependent with Mobility Barriers to Discharge:  Continued Medical Work up   Candie Chroman, LCSW 04/08/2016, 11:27 AM

## 2016-04-08 NOTE — Clinical Social Work Placement (Signed)
   CLINICAL SOCIAL WORK PLACEMENT  NOTE  Date:  04/08/2016  Patient Details  Name: Juan [name] MRN: [id number] Date of Birth: Apr 09, 1941  Clinical Social Work is seeking post-discharge placement for this patient at the [place] level of care (*CSW will initial, date and re-position this form in  chart as items are completed):  Yes   Patient/family provided with [place] Work Department's list of facilities offering this level of care within the geographic area requested by the patient (or if unable, by the patient's family).  Yes   Patient/family informed of their freedom to choose among providers that offer the needed level of care, that participate in Medicare, Medicaid or managed care program needed by the patient, have an available bed and are willing to accept the patient.  Yes   Patient/family informed of 's ownership interest in [facility] and [facility], as well as of the fact that they are under no obligation to receive care at these facilities.  PASRR submitted to EDS on 04/08/16     PASRR number received on       Existing PASRR number confirmed on 04/08/16     FL2 transmitted to all facilities in geographic area requested by pt/family on 04/08/16     FL2 transmitted to all facilities within larger geographic area on       Patient informed that his/her managed care company has contracts with or will negotiate with certain facilities, including the following:            Patient/family informed of bed offers received.  Patient chooses bed at       Physician recommends and patient chooses bed at      Patient to be transferred to   on  .  Patient to be transferred to facility by       Patient family notified on   of transfer.  Name of family member notified:        PHYSICIAN Please sign FL2     Additional Comment:    _______________________________________________ Candie Chroman, LCSW 04/08/2016, 11:29  AM

## 2016-04-08 NOTE — Consult Note (Addendum)
   [facility] Inpatient Consult   04/08/2016  Derreck Wiltsey Dec 26, 1940 [id number]   Patient screened for potential [place] Management services. Patient is eligible for [facility] Care Management services under patient's Medicare  plan. Previously, patient has been active with [facility] Palliative program.   For questions contact:   [name], RN BSN [facility] Liaison  [phone] business mobile phone Toll free office [phone]

## 2016-04-08 NOTE — NC FL2 (Signed)
[place] LEVEL OF CARE SCREENING TOOL     IDENTIFICATION  Patient Name: Juan [name] Birthdate: 07-28-1940 Sex: male Admission Date (Current Location): 04/06/2016  [facility] and [place] Number:  Herbalist and Address:  [facility] [place], [place] [address] [place], [place] [address]      Provider Number: [id number]  Attending Physician Name and Address:  Orson Eva, MD  Relative Name and Phone Number:       Current Level of Care: Hospital Recommended Level of Care: [place] Prior Approval Number:    Date Approved/Denied:   PASRR Number: [id number]  Discharge Plan: SNF    Current Diagnoses: Patient Active Problem List   Diagnosis Date Noted  . Systolic and diastolic CHF, acute on chronic ([place]) 04/08/2016  . Acute respiratory failure with hypoxia ([place]) 04/08/2016  . CHF exacerbation ([place]) 04/07/2016  . CHF (congestive heart failure) ([place]) 04/07/2016  . Acute renal failure superimposed on chronic kidney disease ([place]) 04/07/2016  . Thrombocytosis ([place]) 04/07/2016  . Anemia 04/07/2016  . Community acquired pneumonia 04/01/2016  . Pneumonia 04/01/2016  . Pressure injury of skin 04/01/2016  . Pressure ulcer stage II 12/31/2015  . UTI (urinary tract infection) 12/30/2015  . Sepsis ([place]) 12/29/2015  . UTI (lower urinary tract infection) 12/29/2015  . T wave inversion in EKG 12/29/2015  . PCP NOTES >>>>>> 05/02/2015  . Chronic systolic heart failure ([place]) 02/14/2015  . CKD (chronic kidney disease), stage III 01/22/2015  . Pain in limb   . Peripheral edema   . Unable to ambulate   . Thoracic aortic aneurysm ([place]) 04/26/2014  . CKD (chronic kidney disease) stage 3, GFR 30-59 ml/min 04/26/2014  . 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 Chest pain 04/15/2014  . Palliative care encounter 04/06/2014  . Weakness generalized 04/06/2014  . Venous stasis dermatitis of both lower extremities   . Malnutrition  of moderate degree ([place]) 03/31/2014  . Morbid obesity due to excess calories ([place]) 03/29/2014  . 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 Leukocytosis 10/17/2013  . Poor compliance with advise  05/05/2012  . Annual physical exam 01/14/2012  . Weight loss 01/14/2012  . BPH (benign prostatic hyperplasia) 01/14/2012  . *Chronic lower extremity edema, stasis dermatitis 06/09/2011  . CARDIOMEGALY 04/29/2009  . *Prediabetes 09/07/2006  . Symptomatic anemia 09/07/2006  . Myeloproliferative disease ([place]) 09/07/2006  . Obstructive sleep apnea 09/07/2006  . Essential hypertension 09/07/2006  . Coronary atherosclerosis 09/07/2006  . *CHF-- PCP comments 09/07/2006  . ALLERGIC RHINITIS 09/07/2006    Orientation RESPIRATION BLADDER Height & Weight     Self, Time, Situation, Place  O2 (Nasal canula 2 L) Continent Weight: 195 lb 6.4 oz (88.6 kg) Height:  '6\' 3"'$  (190.5 cm)  BEHAVIORAL SYMPTOMS/MOOD NEUROLOGICAL BOWEL NUTRITION STATUS   (None)  (None) Continent Diet (Heart healthy)  AMBULATORY STATUS COMMUNICATION OF NEEDS Skin   Extensive Assist Verbally PU Stage and Appropriate Care, Other (Comment) (Rash) PU Stage 1 Dressing:  (Medial sacrum. Foam.) PU Stage 2 Dressing:  (Left buttocks. Foam every 5 days.)                   Personal Care Assistance Level of Assistance              Functional Limitations Info  Sight, Hearing, Speech Sight Info: Adequate Hearing Info: Adequate Speech Info: Adequate    SPECIAL CARE FACTORS FREQUENCY  PT (By licensed PT), Blood pressure  PT Frequency: 5 x week              Contractures Contractures Info: Not present    Additional Factors Info  Code Status, Allergies Code Status Info: Full Allergies Info: NKDA           Current Medications (04/08/2016):  This is the current hospital active medication list Current Facility-Administered Medications  Medication Dose Route Frequency Provider Last Rate Last Dose  . 0.9 %   sodium chloride infusion  250 mL Intravenous PRN Norval Morton, MD      . acetaminophen (TYLENOL) tablet 650 mg  650 mg Oral Q4H PRN Norval Morton, MD      . aspirin tablet 325 mg  325 mg Oral Daily Norval Morton, MD   325 mg at 04/08/16 0841  . carvedilol (COREG) tablet 6.25 mg  6.25 mg Oral BID WC Norval Morton, MD   6.25 mg at 04/08/16 0841  . feeding supplement (ENSURE ENLIVE) (ENSURE ENLIVE) liquid 237 mL  237 mL Oral BID PC Maryann Mikhail, DO   237 mL at 04/08/16 0841  . ferrous gluconate (FERGON) tablet 324 mg  324 mg Oral Q2200 Norval Morton, MD   324 mg at 04/07/16 2255  . fluticasone (FLONASE) 50 MCG/ACT nasal spray 2 spray  2 spray Each Nare Daily Norval Morton, MD   2 spray at 04/08/16 0841  . furosemide (LASIX) injection 40 mg  40 mg Intravenous BID Orson Eva, MD   40 mg at 04/08/16 1123  . gabapentin (NEURONTIN) capsule 100 mg  100 mg Oral TID PRN Norval Morton, MD      . guaiFENesin (MUCINEX) 12 hr tablet 1,200 mg  1,200 mg Oral BID Norval Morton, MD   1,200 mg at 04/08/16 0841  . heparin injection 5,000 Units  5,000 Units Subcutaneous Q8H Norval Morton, MD   5,000 Units at 04/08/16 0655  . hydrALAZINE (APRESOLINE) tablet 50 mg  50 mg Oral Q8H Rondell Charmayne Sheer, MD   50 mg at 04/08/16 0655  . HYDROcodone-acetaminophen (NORCO/VICODIN) 5-325 MG per tablet 1 tablet  1 tablet Oral Q6H PRN Norval Morton, MD   1 tablet at 04/08/16 0655  . ipratropium-albuterol (DUONEB) 0.5-2.5 (3) MG/3ML nebulizer solution 3 mL  3 mL Nebulization Q2H PRN Norval Morton, MD      . isosorbide mononitrate (IMDUR) 24 hr tablet 30 mg  30 mg Oral BID Norval Morton, MD   30 mg at 04/08/16 0842  . levofloxacin (LEVAQUIN) tablet 750 mg  750 mg Oral Q48H Rondell A Tamala Julian, MD   750 mg at 04/07/16 2246  . multivitamin with minerals tablet 1 tablet  1 tablet Oral Daily Maryann Mikhail, DO   1 tablet at 04/08/16 0849  . ondansetron (ZOFRAN) injection 4 mg  4 mg Intravenous Q6H PRN Norval Morton, MD       . simethicone (MYLICON) chewable tablet 80 mg  80 mg Oral Q6H PRN Norval Morton, MD   80 mg at 04/08/16 1123  . sodium chloride flush (NS) 0.9 % injection 3 mL  3 mL Intravenous Q12H Norval Morton, MD   3 mL at 04/08/16 0849  . sodium chloride flush (NS) 0.9 % injection 3 mL  3 mL Intravenous PRN Norval Morton, MD         Discharge Medications: Please see discharge summary for a list of discharge medications.  Relevant Imaging Results:  Relevant Lab  Results:   Additional Information SS#: [id number]. Patient wants to go to a SNF that is near a bus stop so his son can visit every few days.  Candie Chroman, LCSW

## 2016-04-08 NOTE — Progress Notes (Signed)
PROGRESS NOTE  Juan [name] NOI:[id number] DOB: 09-Oct-1940 DOA: 04/06/2016 PCP: [name], MD  Brief History:  75 year old African-American male with a past medical history of coronary artery disease, diabetes, diastolic and systolic CHF, CAD,  presented with complaints of SOB, weakness, cough and pain all over. Patient was just hospitalized from 11/15 - 11/18 for community-acquired pneumonia and treated with Levaquin. Patient was discharged home on Levaquin, but notes that he is on a fixed income and couldn't afford to fill the prescription. Chest x-ray at the time of admission showed basilar atelectasis with diffuse airspace opacities concerning for pulmonary edema. The patient was started on intravenous furosemide with good clinical results.  Assessment/Plan: Acute on chronic systolic and diastolic CHF -69/45/0388 echo EF 55-60%, grade 1 DD, PASP 33, mild RV dilatation -restart lasix 40 mg IV BID -Admission chest x-ray shows pulmonary vascular congestion and pulmonary edema -daily weights -I/Os -Patient remains clinically fluid overloaded  Community acquired pneumonia -The patient was discharged on 04/04/2016 with instructionsto finish 5 more days levoflox -continue levoflox -procalcitonin  Acute on chronic renal failure--CKD 3 -Baseline creatinine 1.4-1.8 -Monitor with diuresis  Leukocytosis -There is likely a component of stress demargination, but the patient has chronic leukocytosis likely due to his underlying myeloproliferative disorder -He is afebrile and hemodynamically stable -Finish levofloxacin  Thrombocytosis -There is a component of stress reaction, but the patient has chronic elevated platelets likely due to his underlying myeloproliferative disorder  Diabetes mellitus type 2, controlled -12/26/2015 and hemoglobin A1c 6.1 -Not on any oral agents outpt   Disposition Plan:   SNF in 1-2 days  Family Communication:  No Family at bedside--Total time spent 35  minutes.  Greater than 50% spent face to face counseling and coordinating care.   Consultants:  none  Code Status:  FULL   DVT Prophylaxis:  [place] Heparin    Procedures: As Listed in Progress Note Above  Antibiotics: None    Subjective: Patient feels that his shortness of breath is only slightly better. He is still having dyspnea on exertion. Complains of pain all over and cough with clear sputum.  No n/v/d, abd pain, hematochezia, melena  Objective: Vitals:   04/07/16 1605 04/07/16 2149 04/08/16 0106 04/08/16 0648  BP: 115/65 124/64 132/66 125/68  Pulse: 79 89 99 93  Resp: '17 18 18 18  '$ Temp: 98.7 F (37.1 C) 98.4 F (36.9 C) 98.4 F (36.9 C) 98.4 F (36.9 C)  TempSrc: Oral Oral Oral Oral  SpO2: 99% 97% 95% 97%  Weight:    88.6 kg (195 lb 6.4 oz)  Height:        Intake/Output Summary (Last 24 hours) at 04/08/16 0945 Last data filed at 04/08/16 0800  Gross per 24 hour  Intake              903 ml  Output              600 ml  Net              303 ml   Weight change: -4.355 kg (-9 lb 9.6 oz) Exam:   General:  Pt is alert, follows commands appropriately, not in acute distress  HEENT: No icterus, No thrush, No neck mass, [place]/AT  Cardiovascular: RRR, S1/S2, no rubs, no gallops  Respiratory: Bilateral crackles. No wheeze. Good air movement  Abdomen: Soft/+BS, non tender, non distended, no guarding  Extremities: 2 + LE edema, No lymphangitis, No petechiae, No rashes,  no synovitis   Data Reviewed: I have personally reviewed following labs and imaging studies Basic Metabolic Panel:  Recent Labs Lab 04/03/16 0504 04/04/16 0525 04/07/16 0010 04/07/16 0730 04/08/16 0420  NA 136 137 134* 134* 135  K 4.0 4.5 4.1 4.1 4.0  CL 105 104 103 104 103  CO2 23 23 21* 21* 22  GLUCOSE 125* 107* 120* 122* 105*  BUN 45* 48* 62* 60* 59*  CREATININE 1.35* 1.77* 2.22* 2.06* 2.02*  CALCIUM 9.5 10.1 9.7 9.6 9.3   Liver Function Tests:  Recent Labs Lab 04/07/16 0010    AST 31  ALT 12*  ALKPHOS 76  BILITOT 0.3  PROT 6.3*  ALBUMIN 3.0*   No results for input(s): LIPASE, AMYLASE in the last 168 hours. No results for input(s): AMMONIA in the last 168 hours. Coagulation Profile: No results for input(s): INR, PROTIME in the last 168 hours. CBC:  Recent Labs Lab 04/03/16 0504 04/04/16 0525 04/07/16 0010 04/07/16 0532 04/08/16 0420  WBC 9.5 11.4* 13.2* 10.6* 10.5  NEUTROABS  --   --  6.9 6.8  --   HGB 8.1* 9.4* 9.1* 8.5* 8.1*  HCT 26.0* 30.7* 28.2* 26.8* 25.5*  MCV 80.7 81.2 78.3 77.5* 78.0  PLT 338 474* 578* 527* 484*   Cardiac Enzymes:  Recent Labs Lab 04/01/16 2048 04/07/16 0010 04/07/16 0532 04/07/16 1148 04/07/16 1821  TROPONINI <0.03 0.03* <0.03 0.10* <0.03   BNP: Invalid input(s): POCBNP CBG:  Recent Labs Lab 04/03/16 1151 04/03/16 1616 04/03/16 2022 04/04/16 0811 04/04/16 1151  GLUCAP 123* 126* 116* 99 94   HbA1C: No results for input(s): HGBA1C in the last 72 hours. Urine analysis:    Component Value Date/Time   COLORURINE YELLOW 04/07/2016 [place] 04/07/2016 0634   LABSPEC 1.012 04/07/2016 0634   PHURINE 5.0 04/07/2016 0634   GLUCOSEU NEGATIVE 04/07/2016 0634   GLUCOSEU NEGATIVE 05/02/2015 1008   HGBUR NEGATIVE 04/07/2016 0634   BILIRUBINUR NEGATIVE 04/07/2016 [place] 04/07/2016 0634   PROTEINUR 100 (A) 04/07/2016 0634   UROBILINOGEN 0.2 05/02/2015 1008   NITRITE NEGATIVE 04/07/2016 0634   LEUKOCYTESUR NEGATIVE 04/07/2016 0634   Sepsis Labs: '@LABRCNTIP'$ (procalcitonin:4,lacticidven:4) ) Recent Results (from the past 240 hour(s))  Urine culture     Status: None   Collection Time: 04/01/16  2:40 AM  Result Value Ref Range Status   Specimen Description URINE, RANDOM  Final   Special Requests NONE  Final   Culture NO GROWTH  Final   Report Status 04/02/2016 FINAL  Final  Blood culture (routine x 2)     Status: None   Collection Time: 04/01/16  2:42 AM  Result Value  Ref Range Status   Specimen Description BLOOD RIGHT ARM  Final   Special Requests BOTTLES DRAWN AEROBIC AND ANAEROBIC 5ML  Final   Culture NO GROWTH 5 DAYS  Final   Report Status 04/06/2016 FINAL  Final  Blood culture (routine x 2)     Status: None   Collection Time: 04/01/16  4:26 AM  Result Value Ref Range Status   Specimen Description BLOOD LEFT HAND  Final   Special Requests IN PEDIATRIC BOTTLE 4ML  Final   Culture NO GROWTH 5 DAYS  Final   Report Status 04/06/2016 FINAL  Final  Culture, sputum-assessment     Status: None   Collection Time: 04/01/16  8:42 AM  Result Value Ref Range Status   Specimen Description EXPECTORATED SPUTUM  Final   Special Requests NONE  Final   Sputum evaluation   Final    THIS SPECIMEN IS ACCEPTABLE. RESPIRATORY CULTURE REPORT TO FOLLOW.   Report Status 04/01/2016 FINAL  Final  Culture, respiratory (NON-Expectorated)     Status: None   Collection Time: 04/01/16  8:42 AM  Result Value Ref Range Status   Specimen Description EXPECTORATED SPUTUM  Final   Special Requests NONE  Final   Gram Stain   Final    NO WBC SEEN MODERATE GRAM POSITIVE COCCI IN PAIRS IN SINGLES MODERATE GRAM NEGATIVE RODS MODERATE GRAM VARIABLE ROD MODERATE GRAM POSITIVE RODS MODERATE YEAST FEW SQUAMOUS EPITHELIAL CELLS PRESENT    Culture Consistent with normal respiratory flora.  Final   Report Status 04/03/2016 FINAL  Final     Scheduled Meds: . aspirin  325 mg Oral Daily  . carvedilol  6.25 mg Oral BID WC  . feeding supplement (ENSURE ENLIVE)  237 mL Oral BID PC  . ferrous gluconate  324 mg Oral Q2200  . fluticasone  2 spray Each Nare Daily  . furosemide  40 mg Intravenous BID  . guaiFENesin  1,200 mg Oral BID  . heparin  5,000 Units Subcutaneous Q8H  . hydrALAZINE  50 mg Oral Q8H  . isosorbide mononitrate  30 mg Oral BID  . levofloxacin  750 mg Oral Q48H  . multivitamin with minerals  1 tablet Oral Daily  . sodium chloride flush  3 mL Intravenous Q12H    Continuous Infusions:  Procedures/Studies: Dg Chest 2 View  Result Date: 04/06/2016 CLINICAL DATA:  Shortness of breath. Sore back radiating to both flanks. Discharge from [facility] on Saturday with diagnosis of pneumonia. Continued fever and chills. EXAM: CHEST  2 VIEW COMPARISON:  04/01/2016 FINDINGS: Shallow inspiration with linear atelectasis in the lung bases. Cardiac enlargement. Diffuse airspace opacities in the lungs consistent with edema or multifocal pneumonia. Similar appearance to previous study. No blunting of costophrenic angles. No pneumothorax. Calcified aorta. Thoracic scoliosis convex towards the right. IMPRESSION: Cardiac enlargement. Diffuse airspace disease suggesting edema or pneumonia. Electronically Signed   By: Lucienne Capers M.D.   On: 04/06/2016 23:55   Dg Chest 2 View  Result Date: 04/01/2016 CLINICAL DATA:  Acute onset of fever, cough and body aches. Initial encounter. EXAM: CHEST  2 VIEW COMPARISON:  Chest radiograph performed 12/29/2015 FINDINGS: The lungs are well-aerated. Vascular congestion is noted. Bilateral central airspace opacities may reflect pneumonia or pulmonary edema. No pleural effusion or pneumothorax is seen. The heart is mildly enlarged. No acute osseous abnormalities are seen. IMPRESSION: Vascular congestion and mild cardiomegaly. Bilateral central airspace opacities may reflect pneumonia or pulmonary edema. Electronically Signed   By: Garald Balding M.D.   On: 04/01/2016 03:08   Ct Chest Wo Contrast  Result Date: 04/01/2016 CLINICAL DATA:  Chest pain. Evaluate for aortic aneurysm. History of myeloproliferative disorder. EXAM: CT CHEST WITHOUT CONTRAST TECHNIQUE: Multidetector CT imaging of the chest was performed following the standard protocol without IV contrast. COMPARISON:  06/21/2014 FINDINGS: Cardiovascular: Ascending thoracic aorta measures up to 4.4 cm and stable. Atherosclerotic calcifications involving the posterior aortic  arch. Proximal descending thoracic aorta measures 3.8 cm and stable. Mid descending thoracic aorta measures 3.4 cm and stable. Main pulmonary artery is markedly enlarged measuring 5 cm but stable. Mediastinum/Nodes: Small mediastinal lymph nodes have not significantly changed. There is a 1 cm hypodense nodule or cyst in the right thyroid lobe. There is subcutaneous edema in the chest. No significant pericardial fluid. Lungs/Pleura: Trachea and mainstem  bronchi are patent. Again noted is a mosaic attenuation pattern throughout the lungs. This pattern probably represents multiple areas of air trapping. These are prominent pulmonary vessels in the lung bases. Volume loss in the right lower lobe related to elevation of the right hemidiaphragm. There is some septal thickening in the upper lobes. No significant pleural effusions. Upper Abdomen: Again noted is an enlarged spleen which is only partially visualized. No acute abnormality in the upper abdomen. There is a high-density structure within the stomach lumen. Musculoskeletal: The visualized bones are diffusely sclerotic which is similar to the previous examination. There are small scattered lucent areas throughout the bones. Some of these lucent areas/lesions have enlarged from the prior examination. Lucent lesion along the posterior left sixth rib now measures 1.1 cm and previously measured 0.7 cm. No evidence for pathologic fracture. 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 Recommend annual imaging followup by CTA or MRA. This recommendation follows 2010 ACCF/AHA/AATS/ACR/ASA/SCA/SCAI/SIR/STS/SVM Guidelines for the Diagnosis and Management of Patients with Thoracic Aortic Disease. Circulation. 2010; 121: [id number] Stable enlargement of pulmonary arteries compatible with pulmonary hypertension. Mosaic attenuation pattern throughout both lungs. Similar finding was present on the previous examination. This is probably related  to a combination of air trapping and mild edema. Bones are diffusely sclerotic with enlarged lucent lesions. 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 Splenomegaly. This is probably related to the history of myeloproliferative disorder. Electronically Signed   By: Markus Daft M.D.   On: 04/01/2016 11:14    Erian Lariviere, DO  [facility] Pager [phone]  If 7PM-7AM, please contact night-coverage [email or web address] Password [email or web address] 04/08/2016, 9:45 AM   LOS: 0 days

## 2016-04-08 NOTE — Progress Notes (Signed)
Pt. Is alert and oriented with concerns about SNF placement close to home, Plan to discharge 04/10/16

## 2016-04-09 DIAGNOSIS — D471 Chronic myeloproliferative disease: Secondary | ICD-10-CM

## 2016-04-09 LAB — BASIC METABOLIC PANEL
Anion gap: 8 (ref 5–15)
BUN: 58 mg/dL — AB (ref 6–20)
CHLORIDE: 106 mmol/L (ref 101–111)
CO2: 22 mmol/L (ref 22–32)
CREATININE: 1.94 mg/dL — AB (ref 0.61–1.24)
Calcium: 9.4 mg/dL (ref 8.9–10.3)
GFR calc Af Amer: 37 mL/min — ABNORMAL LOW (ref >60)
GFR, EST NON AFRICAN AMERICAN: 32 mL/min — AB (ref >60)
GLUCOSE: 109 mg/dL — AB (ref 65–99)
POTASSIUM: 4.2 mmol/L (ref 3.5–5.1)
Sodium: 136 mmol/L (ref 135–145)

## 2016-04-09 LAB — IRON AND TIBC
IRON: 6 ug/dL — AB (ref 45–182)
SATURATION RATIOS: 4 % — AB (ref 17.9–39.5)
TIBC: 167 ug/dL — AB (ref 250–450)
UIBC: 161 ug/dL

## 2016-04-09 LAB — SAVE SMEAR

## 2016-04-09 LAB — RETICULOCYTES
RBC.: 3.74 MIL/uL — ABNORMAL LOW (ref 4.22–5.81)
RETIC COUNT ABSOLUTE: 67.3 10*3/uL (ref 19.0–186.0)
Retic Ct Pct: 1.8 % (ref 0.4–3.1)

## 2016-04-09 LAB — FOLATE: FOLATE: 56.7 ng/mL (ref >5.9)

## 2016-04-09 LAB — FERRITIN: Ferritin: 417 ng/mL — ABNORMAL HIGH (ref 24–336)

## 2016-04-09 LAB — VITAMIN B12: Vitamin B-12: 1372 pg/mL — ABNORMAL HIGH (ref 180–914)

## 2016-04-09 MED ORDER — FUROSEMIDE 10 MG/ML IJ SOLN
80.0000 mg | Freq: Two times a day (BID) | INTRAMUSCULAR | Status: DC
  Administered 2016-04-09 – 2016-04-10 (×2): 80 mg via INTRAVENOUS
  Filled 2016-04-09 (×2): qty 8

## 2016-04-09 MED ORDER — FUROSEMIDE 10 MG/ML IJ SOLN: 60.0000 mg | Freq: Two times a day (BID) | INTRAMUSCULAR | Status: DC

## 2016-04-09 NOTE — Progress Notes (Signed)
Pt is are and oriented up in the chair, hard to get comfortable, refuses to go to bed, 2 person total care. Unable to use stedy to help today.

## 2016-04-09 NOTE — Progress Notes (Signed)
Pt awake during the night, c/o being hot and cold, having chills, Tylenol given along with a sponge bathe, tol well, Provolone boots ordered, pt refused, c/o pain with minimum touch to LE and back, will continue to monitor.

## 2016-04-09 NOTE — Progress Notes (Signed)
PROGRESS NOTE  Juan [name] HQI:[id number] DOB: 1941/03/02 DOA: 04/06/2016 PCP: [name], MD  Brief History:  75 year old African-American male with a past medical history of coronary artery disease, diabetes, diastolic and systolic CHF, CAD,  presented with complaints of SOB, weakness, cough and pain all over. Patient was just hospitalized from 11/15 - 11/18 for community-acquired pneumonia and treated with Levaquin. Patient was discharged home on Levaquin, but notes that he is on a fixed income and couldn't afford to fillthe prescription. Chest x-ray at the time of admission showed basilar atelectasis with diffuse airspace opacities concerning for pulmonary edema. The patient was started on intravenous furosemide with good clinical results.  Assessment/Plan: Acute on chronic systolic and diastolic CHF -13/24/4010 echo EF 55-60%, grade 1 DD, PASP 33, mild RV dilatation -restart lasix 40 mg IV BID initially -Admission chest x-ray shows pulmonary vascular congestion and pulmonary edema -daily weights--dry weight ~194 -04/09/16 weight 201 -I/Os--only 650cc out in past 24 hours -Patient remains clinically fluid overloaded -increase furosemide to 80 mg IV q 12 hours -d/c hydralazine and imdur due to soft BPs  Pulmonary infiltrates -question if his clinical syndrome is consistent with pneumonia -suspect this is more pulmonary edema -The patient was discharged on 04/04/2016 with instructionsto finish 5 more days levoflox -d/c levoflox after today's dose -procalcitonin  Acute on chronic renal failure--CKD 3 -Baseline creatinine 1.4-1.8 -Monitor with diuresis -serum creatinine peak 2.22  Myelodysplastic syndrome/Myelofibrosis -saw Hem[facility] at [facility] July 2017--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 He had 3% blasts in the peripheral blood, which was confirmed by flow cytometry -recommended Jakafi at that time, but pt refused BM bx and was lost to follow up at [place] [facility] -04/07/16 diff with increase WBC precursors -consulted hematology--spoke with Dr. Clyda Hurdle see 11/24 -check smear, iron studies, B12, folate, retic -He is afebrile and hemodynamically stable  Thrombocytosis -There is a component of myelofibrosis  Diabetes mellitus type 2, controlled -12/26/2015 and hemoglobin A1c 6.1 -Not on any oral agents outpt   Disposition Plan:   SNF in 2-3 days  Family Communication:  No Family at bedside--Total time spent 35 minutes.  Greater than 50% spent face to face counseling and coordinating care. Patient refuses to allow me to speak with family   Consultants:  none  Code Status:  FULL   DVT Prophylaxis:  [place] Heparin    Procedures: As Listed in Progress Note Above  Antibiotics: None    Subjective: Patient still complains of shortness of breath but it is a little bit better than yesterday. Denies any fevers, chills, chest pain, nausea, vomiting, diarrhea, abdominal pain. Denies any headache or neck pain. Denies any abdominal pain, dysuria, hematuria. No hematochezia or melena  Objective: Vitals:   04/08/16 2105 04/09/16 0704 04/09/16 1234 04/09/16 1300  BP: 124/66 120/82 (!) 94/52 104/61  Pulse: (!) 102 (!) 105 92   Resp:  18 18   Temp: 99.3 F (37.4 C) 99 F (37.2 C) 98.2 F (36.8 C)   TempSrc: Oral Oral Oral   SpO2: 93% 95% 98% 100%  Weight:  91.4 kg (201 lb 8 oz)    Height:        Intake/Output Summary (Last 24 hours) at 04/09/16 1426 Last data filed at 04/09/16 1300  Gross per 24 hour  Intake              620 ml  Output  1650 ml  Net            -1030 ml   Weight change:  Exam:   General:  Pt is alert, follows commands appropriately, not in acute distress  HEENT: No icterus, No thrush, No neck mass, [place]/AT  Cardiovascular: RRR, S1/S2, no rubs, no gallops  Respiratory: Bilateral crackles. No wheezing.  Abdomen: Soft/+BS, non tender, non distended, no guarding  Extremities: 1  + LE edema, No lymphangitis, No petechiae, No rashes, no synovitis   Data Reviewed: I have personally reviewed following labs and imaging studies Basic Metabolic Panel:  Recent Labs Lab 04/04/16 0525 04/07/16 0010 04/07/16 0730 04/08/16 0420 04/09/16 0458  NA 137 134* 134* 135 136  K 4.5 4.1 4.1 4.0 4.2  CL 104 103 104 103 106  CO2 23 21* 21* 22 22  GLUCOSE 107* 120* 122* 105* 109*  BUN 48* 62* 60* 59* 58*  CREATININE 1.77* 2.22* 2.06* 2.02* 1.94*  CALCIUM 10.1 9.7 9.6 9.3 9.4   Liver Function Tests:  Recent Labs Lab 04/07/16 0010  AST 31  ALT 12*  ALKPHOS 76  BILITOT 0.3  PROT 6.3*  ALBUMIN 3.0*   No results for input(s): LIPASE, AMYLASE in the last 168 hours. No results for input(s): AMMONIA in the last 168 hours. Coagulation Profile: No results for input(s): INR, PROTIME in the last 168 hours. CBC:  Recent Labs Lab 04/03/16 0504 04/04/16 0525 04/07/16 0010 04/07/16 0532 04/08/16 0420  WBC 9.5 11.4* 13.2* 10.6* 10.5  NEUTROABS  --   --  6.9 6.8  --   HGB 8.1* 9.4* 9.1* 8.5* 8.1*  HCT 26.0* 30.7* 28.2* 26.8* 25.5*  MCV 80.7 81.2 78.3 77.5* 78.0  PLT 338 474* 578* 527* 484*   Cardiac Enzymes:  Recent Labs Lab 04/07/16 0010 04/07/16 0532 04/07/16 1148 04/07/16 1821  TROPONINI 0.03* <0.03 0.10* <0.03   BNP: Invalid input(s): POCBNP CBG:  Recent Labs Lab 04/03/16 1151 04/03/16 1616 04/03/16 2022 04/04/16 0811 04/04/16 1151  GLUCAP 123* 126* 116* 99 94   HbA1C: No results for input(s): HGBA1C in the last 72 hours. Urine analysis:    Component Value Date/Time   COLORURINE YELLOW 04/07/2016 [place] 04/07/2016 0634   LABSPEC 1.012 04/07/2016 0634   PHURINE 5.0 04/07/2016 0634   GLUCOSEU NEGATIVE 04/07/2016 0634   GLUCOSEU NEGATIVE 05/02/2015 1008   HGBUR NEGATIVE 04/07/2016 0634   BILIRUBINUR NEGATIVE 04/07/2016 [place] 04/07/2016 0634   PROTEINUR 100 (A) 04/07/2016 0634   UROBILINOGEN 0.2  05/02/2015 1008   NITRITE NEGATIVE 04/07/2016 0634   LEUKOCYTESUR NEGATIVE 04/07/2016 0634   Sepsis Labs: '@LABRCNTIP'$ (procalcitonin:4,lacticidven:4) ) Recent Results (from the past 240 hour(s))  Urine culture     Status: None   Collection Time: 04/01/16  2:40 AM  Result Value Ref Range Status   Specimen Description URINE, RANDOM  Final   Special Requests NONE  Final   Culture NO GROWTH  Final   Report Status 04/02/2016 FINAL  Final  Blood culture (routine x 2)     Status: None   Collection Time: 04/01/16  2:42 AM  Result Value Ref Range Status   Specimen Description BLOOD RIGHT ARM  Final   Special Requests BOTTLES DRAWN AEROBIC AND ANAEROBIC 5ML  Final   Culture NO GROWTH 5 DAYS  Final   Report Status 04/06/2016 FINAL  Final  Blood culture (routine x 2)     Status: None   Collection Time: 04/01/16  4:26 AM  Result Value Ref Range Status   Specimen Description BLOOD LEFT HAND  Final   Special Requests IN PEDIATRIC BOTTLE 4ML  Final   Culture NO GROWTH 5 DAYS  Final   Report Status 04/06/2016 FINAL  Final  Culture, sputum-assessment     Status: None   Collection Time: 04/01/16  8:42 AM  Result Value Ref Range Status   Specimen Description EXPECTORATED SPUTUM  Final   Special Requests NONE  Final   Sputum evaluation   Final    THIS SPECIMEN IS ACCEPTABLE. RESPIRATORY CULTURE REPORT TO FOLLOW.   Report Status 04/01/2016 FINAL  Final  Culture, respiratory (NON-Expectorated)     Status: None   Collection Time: 04/01/16  8:42 AM  Result Value Ref Range Status   Specimen Description EXPECTORATED SPUTUM  Final   Special Requests NONE  Final   Gram Stain   Final    NO WBC SEEN MODERATE GRAM POSITIVE COCCI IN PAIRS IN SINGLES MODERATE GRAM NEGATIVE RODS MODERATE GRAM VARIABLE ROD MODERATE GRAM POSITIVE RODS MODERATE YEAST FEW SQUAMOUS EPITHELIAL CELLS PRESENT    Culture Consistent with normal respiratory flora.  Final   Report Status 04/03/2016 FINAL  Final     Scheduled  Meds: . aspirin  325 mg Oral Daily  . carvedilol  6.25 mg Oral BID WC  . feeding supplement (ENSURE ENLIVE)  237 mL Oral BID PC  . ferrous gluconate  324 mg Oral Q2200  . fluticasone  2 spray Each Nare Daily  . furosemide  60 mg Intravenous BID  . guaiFENesin  1,200 mg Oral BID  . heparin  5,000 Units Subcutaneous Q8H  . levofloxacin  750 mg Oral Q48H  . multivitamin with minerals  1 tablet Oral Daily  . sodium chloride flush  3 mL Intravenous Q12H   Continuous Infusions:  Procedures/Studies: Dg Chest 2 View  Result Date: 04/06/2016 CLINICAL DATA:  Shortness of breath. Sore back radiating to both flanks. Discharge from [facility] on Saturday with diagnosis of pneumonia. Continued fever and chills. EXAM: CHEST  2 VIEW COMPARISON:  04/01/2016 FINDINGS: Shallow inspiration with linear atelectasis in the lung bases. Cardiac enlargement. Diffuse airspace opacities in the lungs consistent with edema or multifocal pneumonia. Similar appearance to previous study. No blunting of costophrenic angles. No pneumothorax. Calcified aorta. Thoracic scoliosis convex towards the right. IMPRESSION: Cardiac enlargement. Diffuse airspace disease suggesting edema or pneumonia. Electronically Signed   By: Lucienne Capers M.D.   On: 04/06/2016 23:55   Dg Chest 2 View  Result Date: 04/01/2016 CLINICAL DATA:  Acute onset of fever, cough and body aches. Initial encounter. EXAM: CHEST  2 VIEW COMPARISON:  Chest radiograph performed 12/29/2015 FINDINGS: The lungs are well-aerated. Vascular congestion is noted. Bilateral central airspace opacities may reflect pneumonia or pulmonary edema. No pleural effusion or pneumothorax is seen. The heart is mildly enlarged. No acute osseous abnormalities are seen. IMPRESSION: Vascular congestion and mild cardiomegaly. Bilateral central airspace opacities may reflect pneumonia or pulmonary edema. Electronically Signed   By: Garald Balding M.D.   On: 04/01/2016 03:08   Ct  Chest Wo Contrast  Result Date: 04/01/2016 CLINICAL DATA:  Chest pain. Evaluate for aortic aneurysm. History of myeloproliferative disorder. EXAM: CT CHEST WITHOUT CONTRAST TECHNIQUE: Multidetector CT imaging of the chest was performed following the standard protocol without IV contrast. COMPARISON:  06/21/2014 FINDINGS: Cardiovascular: Ascending thoracic aorta measures up to 4.4 cm and stable. Atherosclerotic calcifications involving the posterior aortic arch. Proximal descending thoracic aorta measures 3.8  cm and stable. Mid descending thoracic aorta measures 3.4 cm and stable. Main pulmonary artery is markedly enlarged measuring 5 cm but stable. Mediastinum/Nodes: Small mediastinal lymph nodes have not significantly changed. There is a 1 cm hypodense nodule or cyst in the right thyroid lobe. There is subcutaneous edema in the chest. No significant pericardial fluid. Lungs/Pleura: Trachea and mainstem bronchi are patent. Again noted is a mosaic attenuation pattern throughout the lungs. This pattern probably represents multiple areas of air trapping. These are prominent pulmonary vessels in the lung bases. Volume loss in the right lower lobe related to elevation of the right hemidiaphragm. There is some septal thickening in the upper lobes. No significant pleural effusions. Upper Abdomen: Again noted is an enlarged spleen which is only partially visualized. No acute abnormality in the upper abdomen. There is a high-density structure within the stomach lumen. Musculoskeletal: The visualized bones are diffusely sclerotic which is similar to the previous examination. There are small scattered lucent areas throughout the bones. Some of these lucent areas/lesions have enlarged from the prior examination. Lucent lesion along the posterior left sixth rib now measures 1.1 cm and previously measured 0.7 cm. No evidence for pathologic fracture. 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 Recommend annual imaging followup by CTA or MRA. This recommendation follows 2010 ACCF/AHA/AATS/ACR/ASA/SCA/SCAI/SIR/STS/SVM Guidelines for the Diagnosis and Management of Patients with Thoracic Aortic Disease. Circulation. 2010; 121: L381-O175 Stable enlargement of pulmonary arteries compatible with pulmonary hypertension. Mosaic attenuation pattern throughout both lungs. Similar finding was present on the previous examination. This is probably related to a combination of air trapping and mild edema. Bones are diffusely sclerotic with enlarged lucent lesions. 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 Splenomegaly. This is probably related to the history of myeloproliferative disorder. Electronically Signed   By: Markus Daft M.D.   On: 04/01/2016 11:14    Lamiyah Schlotter, DO  [facility] Pager [phone]  If 7PM-7AM, please contact night-coverage [email or web address] Password [email or web address] 04/09/2016, 2:26 PM   LOS: 1 day

## 2016-04-09 NOTE — Progress Notes (Signed)
Patient is alert and oriented very demanding, poor appetite, state that he would like some to increase it, MD aware. Up in the chair 2 assist. Charlaine Dalton stand was unsuccessful. Paln to sit up for a few hours and product a bowel movement.

## 2016-04-10 ENCOUNTER — Other Ambulatory Visit: Payer: Self-pay | Admitting: Oncology

## 2016-04-10 ENCOUNTER — Encounter (HOSPITAL_COMMUNITY): Payer: Self-pay | Admitting: Physician Assistant

## 2016-04-10 ENCOUNTER — Inpatient Hospital Stay (HOSPITAL_COMMUNITY): Payer: Medicare Other

## 2016-04-10 DIAGNOSIS — I5033 Acute on chronic diastolic (congestive) heart failure: Secondary | ICD-10-CM

## 2016-04-10 LAB — CBC WITH DIFFERENTIAL/PLATELET
BASOS PCT: 4 %
Band Neutrophils: 0 %
Basophils Absolute: 0.6 10*3/uL — ABNORMAL HIGH (ref 0.0–0.1)
Blasts: 0 %
EOS ABS: 0 10*3/uL (ref 0.0–0.7)
Eosinophils Relative: 0 %
HCT: 26.5 % — ABNORMAL LOW (ref 39.0–52.0)
Hemoglobin: 8.7 g/dL — ABNORMAL LOW (ref 13.0–17.0)
Lymphocytes Relative: 15 %
Lymphs Abs: 2.1 10*3/uL (ref 0.7–4.0)
MCH: 25.5 pg — ABNORMAL LOW (ref 26.0–34.0)
MCHC: 32.8 g/dL (ref 30.0–36.0)
MCV: 77.7 fL — AB (ref 78.0–100.0)
MYELOCYTES: 0 %
Metamyelocytes Relative: 0 %
Monocytes Absolute: 2.4 10*3/uL — ABNORMAL HIGH (ref 0.1–1.0)
Monocytes Relative: 17 %
NEUTROS PCT: 64 %
NRBC: 0 /100{WBCs}
Neutro Abs: 9.2 10*3/uL — ABNORMAL HIGH (ref 1.7–7.7)
PLATELETS: 575 10*3/uL — AB (ref 150–400)
PROMYELOCYTES ABS: 0 %
RBC: 3.41 MIL/uL — ABNORMAL LOW (ref 4.22–5.81)
RDW: 19 % — ABNORMAL HIGH (ref 11.5–15.5)
WBC: 14.3 10*3/uL — AB (ref 4.0–10.5)

## 2016-04-10 LAB — BASIC METABOLIC PANEL
ANION GAP: 11 (ref 5–15)
BUN: 69 mg/dL — ABNORMAL HIGH (ref 6–20)
CALCIUM: 9.3 mg/dL (ref 8.9–10.3)
CO2: 21 mmol/L — ABNORMAL LOW (ref 22–32)
Chloride: 102 mmol/L (ref 101–111)
Creatinine, Ser: 2.34 mg/dL — ABNORMAL HIGH (ref 0.61–1.24)
GFR, EST AFRICAN AMERICAN: 30 mL/min — AB (ref >60)
GFR, EST NON AFRICAN AMERICAN: 26 mL/min — AB (ref >60)
GLUCOSE: 123 mg/dL — AB (ref 65–99)
Potassium: 4.8 mmol/L (ref 3.5–5.1)
Sodium: 134 mmol/L — ABNORMAL LOW (ref 135–145)

## 2016-04-10 LAB — PROCALCITONIN: Procalcitonin: 3.16 ng/mL

## 2016-04-10 NOTE — Progress Notes (Signed)
Physical Therapy Treatment Patient Details Name: Juan [name] MRN: [id number] DOB: 21-Jul-1940 Today's Date: 04/10/2016    History of Present Illness 75 y.o. male with recent hospitalization 11/15-11/18/17 for PNA re-admitted with CHF exacerbation. PMH of CAD, DM, CHF, chronic pain, HTN.     PT Comments    Pt continues to have decline in all aspects of mobility. Pt requires max encouragement to participate and demonstrates very low energy during therapy.   Follow Up Recommendations  SNF     Equipment Recommendations  None recommended by PT    Recommendations for Other Services       Precautions / Restrictions Precautions Precautions: Fall Restrictions Weight Bearing Restrictions: No    Mobility  Bed Mobility Overal bed mobility: Needs Assistance Bed Mobility: Supine to Sit;Sit to Supine     Supine to sit: +2 for physical assistance;Max assist Sit to supine: +2 for physical assistance;Max assist   General bed mobility comments: Assist with all aspects  Transfers Overall transfer level: Needs assistance Equipment used: Rolling walker (2 wheeled)             General transfer comment: Attempted x 2 to stand from EOB but was unable with 2 person assist.  Ambulation/Gait                 Stairs            Wheelchair Mobility    Modified Rankin (Stroke Patients Only)       Balance Overall balance assessment: Needs assistance Sitting-balance support: Bilateral upper extremity supported;Feet supported Sitting balance-Leahy Scale: Poor Sitting balance - Comments: UE support                            Cognition Arousal/Alertness: Awake/alert Behavior During Therapy: Flat affect Overall Cognitive Status: Within Functional Limits for tasks assessed                      Exercises General Exercises - Upper Extremity Shoulder Flexion: AAROM;Both;10 reps;Supine General Exercises - Lower Extremity Heel Slides: AAROM;10  reps;Supine Hip ABduction/ADduction: AAROM;10 reps;Supine    General Comments        Pertinent Vitals/Pain Pain Assessment: 0-10 Pain Score: 10-Worst pain ever Faces Pain Scale: Hurts little more Pain Location: bilateral soles of feet Pain Descriptors / Indicators: Burning Pain Intervention(s): Limited activity within patient's tolerance;Repositioned    Home Living                      Prior Function            PT Goals (current goals can now be found in the care plan section) Acute Rehab PT Goals Patient Stated Goal: to get stronger Progress towards PT goals: Not progressing toward goals - comment    Frequency    Min 2X/week      PT Plan Current plan remains appropriate;Frequency needs to be updated    Co-evaluation             End of Session Equipment Utilized During Treatment: Gait belt Activity Tolerance: Patient limited by fatigue Patient left: with call [place]/phone within reach;in bed;with bed alarm set     Time: 4098-1191 PT Time Calculation (min) (ACUTE ONLY): 22 min  Charges:  $Therapeutic Activity: 8-22 mins                    G Codes:      Shary Decamp [facility]  04/10/2016, 3:44 PM [facility] PT [phone]

## 2016-04-10 NOTE — Progress Notes (Signed)
Patient very weak, slept majority of 7 a to 7 p shift.  Yells out in pain whenever touched anywhere on body, c/o especially of leg pain.  Tylenol given twice during shift.  Patient ate minimally, did drink half of an Ensure, ate no lunch or supper, states he has no appetite.  Did request bed pan numerous times but no BM today.

## 2016-04-10 NOTE — Consult Note (Signed)
CARDIOLOGY CONSULT NOTE   Patient ID: Juan [name] MRN: [id number] DOB/AGE: [birth year] 75 y.o.  Admit date: 04/06/2016  Primary Physician   Juan November, MD Primary HF Cardiologist   Juan [name] (Juan [name] saw 11/09) Reason for Consultation   CHF Requesting MD: Juan [name]  [name] with a history of of Diastolic HF, OSA, HTN, DM2, 4.3 cm Asc Ao Aneurysm, ?CAD w/ non-isch MV 2007and myeloproliferative disorder followed by Oncology (has refused further workup).   Admitted to [facility] 12/2015 with UTI,  increased dyspnea and volume overload. Diuresed with IV Lasix, transitioned to torsemide 80 mg/ day.   He returned 11/09 for follow up. Complaining of a rash on his back and groin. Denies SOB/PND/Orthopnea. Walks with care. Taking all medications. His son helps with ADLs  as needed. Paramedicine following weekly. Requires assistance with transportation. Wt 211  Admit 11/15-11/18 for CAP, pt got IVF & IV Lasix, torsemide at d/c, Levaquin at d/c. Wt at d/c 203  Admitted 11/21 w/ SOB, pain. Had not filled Levaquin. Cr 2.22, O2 sats 89%, wt 197>>195>>201>>198 IV Lasix 40 mg 11/21, 40 mg bid 11/22, 40 mg am w/ '80mg'$  pm 11/23, now on 80 mg bid. Hydralazine and Imdur d/c'd 11/23 2nd low BP. I/o net -932.  Pt sleepy but answers appropriately. Says breathing is a little better than yesterday, not much better. Is very weak at baseline, 2-person assist this admission. He says he gets out and walks around a little at home when feeling better, but cannot say the last time he did that. +orthopnea, currently denies PND. LE edema has improved.   No sig cough, no fevers.  ECHO 12/24/2015: Ef 55-60%. PAS 33 Echo 01/21/15 EF 30%, moderate LVH, mild MR, PA peak pressure 59 mm Hg, Normal RV  Labs 01/29/2015: K 3.9 Creatinine 1.59  Labs 02/14/15: K 3.9 Creatinine 1.72  Labs 02/22/2015: K 4.1 Creatinine 1.82  Labs 05/23/2015: K 5.2 Creatinine 1.39  Labs 08/21/2015: K+ 4.7, Cr 1.92 Labs  10/24/2015:  K+ 4.6, Cr 1.69 Labs 01/03/2016: K+ 4.5, Cr 1.97 Labs 04/04/2016: K+ 4.5, Cr 1.77 Labs 04/07/2016: K+ 4.1, Cr 2.22 Labs 04/08/2016: K+ 4.2, Cr 2.02 Labs 04/09/2016: K+ 4.3, Cr 1.94 Labs 04/10/2016: K+ 4.8, Cr 2.34   Past Medical History:  Diagnosis Date  . Allergic rhinitis   . Anemia   . Ascending aortic aneurysm ([facility]) 03/2014   4.3cm on CT scan  . CAD (coronary artery disease)    dx elsewheer in past, no documentation. Non-ischemic myoview 2007  . Chronic diastolic CHF (congestive heart failure), NYHA class 2 (HCC)    Normal EF w/ grade 1 dd by echo 12/2015  . Edema    R>L leg, u/s 5-12 neg for DVT  . Hemorrhoid   . History of thrombocytosis   . Hypertension   . Migraine    "once/wk at least" (07/11/2013)  . Myeloproliferative disease ([place])   . Shortness of breath   . Sinus congestion   . Sleep apnea, obstructive    at some point used CPAP, was d/c  years ago  . Type II diabetes mellitus ([place])      Past Surgical History:  Procedure Laterality Date  . TOE SURGERY Right    "tried to straighten out big toe" (07/11/2013)   No Known Allergies  I have reviewed the patient's current medications . aspirin  325 mg Oral Daily  . carvedilol  6.25 mg Oral BID  WC  . feeding supplement (ENSURE ENLIVE)  237 mL Oral BID PC  . ferrous gluconate  324 mg Oral Q2200  . fluticasone  2 spray Each Nare Daily  . furosemide  80 mg Intravenous BID  . guaiFENesin  1,200 mg Oral BID  . heparin  5,000 Units Subcutaneous Q8H  . levofloxacin  750 mg Oral Q48H  . multivitamin with minerals  1 tablet Oral Daily  . sodium chloride flush  3 mL Intravenous Q12H    sodium chloride, acetaminophen, gabapentin, HYDROcodone-acetaminophen, ipratropium-albuterol, MUSCLE RUB, ondansetron (ZOFRAN) IV, simethicone, sodium chloride flush  Medication Sig  acetaminophen (TYLENOL) 500 MG tablet Take 500-1,000 mg by mouth every 6 (six) hours as needed for moderate pain or headache. Reported on  05/02/2015  aspirin 325 MG tablet Take 1 tablet (325 mg total) by mouth daily.  carvedilol (COREG) 6.25 MG tablet TAKE 1 TABLET BY MOUTH 2 TIMES DAILY. Patient taking differently: TAKE 12.5 MG BY MOUTH 2 TIMES DAILY.  feeding supplement, GLUCERNA SHAKE, (GLUCERNA SHAKE) LIQD Take 237 mLs by mouth as needed (protein). Reported on 08/21/2015  ferrous gluconate (FERGON) 225 (27 Fe) MG tablet Take 240 mg by mouth daily at 10 pm.  fluticasone (FLONASE) 50 MCG/ACT nasal spray Place 2 sprays into both nostrils daily. Reported on 05/02/2015  gabapentin (NEURONTIN) 100 MG capsule Take 100 mg by mouth 3 (three) times daily as needed (pain). Reported on 08/22/2015  guaiFENesin (MUCINEX) 600 MG 12 hr tablet Take 2 tablets (1,200 mg total) by mouth 2 (two) times daily.  hydrALAZINE (APRESOLINE) 25 MG tablet TAKE 2 TABLETS BY MOUTH 3 TIMES DAILY. Patient taking differently: TAKE 50 MG BY MOUTH 3 TIMES DAILY.  hydrocerin (EUCERIN) CREA Apply Eucerin cream to BLE Q day after bathing and roughly towel drying to remove loose skin  HYDROcodone-acetaminophen (NORCO/VICODIN) 5-325 MG tablet Take 1 tablet by mouth every 6 (six) hours as needed for moderate pain.  isosorbide mononitrate (IMDUR) 30 MG 24 hr tablet TAKE 1 TABLET (30 MG TOTAL) BY MOUTH 2 (TWO) TIMES DAILY. Patient taking differently: Take 30 mg by mouth 2 (two) times daily as needed (for blood pressure).   simethicone (MYLICON) 80 MG chewable tablet Chew 80 mg by mouth every 6 (six) hours as needed for flatulence.  torsemide (DEMADEX) 20 MG tablet Take 4 tablets (80 mg total) by mouth daily.  vitamin B-12 (CYANOCOBALAMIN) 100 MCG tablet Take 100 mcg by mouth daily.     Social History   Social History  . Marital status: Widowed    Spouse name: N/A  . Number of children: 2  . Years of education: N/A   Occupational History  . retired    .  Retired   Social History Main Topics  . Smoking status: Former Smoker    Packs/day: 0.25    Years: 12.00     Types: Cigarettes    Quit date: 05/18/1966  . Smokeless tobacco: Never Used     Comment: quit smoking 45 years ago  . Alcohol use No  . Drug use: No  . Sexual activity: Yes   Other Topics Concern  . Not on file   Social History Narrative   Moved from [place] 2006   Widow 2007   Son lives with him     Family Status  Relation Status  . Maternal Grandmother Deceased  . Maternal Grandfather Deceased  . Paternal Grandmother Deceased  . Paternal Grandfather Deceased  . Son   . Neg Hx    Family  History  Problem Relation Age of Onset  . Schizophrenia Son   . Colon cancer Neg Hx   . Prostate cancer Neg Hx   . Heart attack Neg Hx   . Diabetes Neg Hx      ROS:  Full 14 point review of systems complete and found to be negative unless listed above.  Physical Exam: Blood pressure 122/72, pulse 99, temperature 97.6 F (36.4 C), temperature source Oral, resp. rate 18, height '6\' 3"'$  (1.905 m), weight 198 lb 6.4 oz (90 kg), SpO2 96 %.  General: Lethargic minimally arousable. Moaning at times Head: Eyes PERRLA, No xanthomas. Normocephalic and atraumatic, oropharynx without edema or exudate. Dentition: poor Lungs: rales bases R>L Heart: HRRR S1 S2, no rub/gallop, no murmur. pulses are 1-2+ all 4 extrem.   Neck: No carotid bruits. No lymphadenopathy.  JVP 6 Abdomen: Bowel sounds present, abdomen soft and non-tender without masses or hernias noted. Msk:  No spine or cva tenderness. No weakness, no joint deformities or effusions. Extremities: No clubbing or cyanosis. No edema.  Neuro: Alert and oriented X 3. No focal deficits noted. Psych:  Good affect, responds appropriately Skin: No rashes or lesions noted.  Labs:   Lab Results  Component Value Date   WBC 14.3 (H) 04/10/2016   HGB 8.7 (L) 04/10/2016   HCT 26.5 (L) 04/10/2016   MCV 77.7 (L) 04/10/2016   PLT 575 (H) 04/10/2016    Recent Labs Lab 04/07/16 0010  04/10/16 0528  NA 134*  < > 134*  K 4.1  < > 4.8  CL 103  < > 102  CO2  21*  < > 21*  BUN 62*  < > 69*  CREATININE 2.22*  < > 2.34*  CALCIUM 9.7  < > 9.3  PROT 6.3*  --   --   BILITOT 0.3  --   --   ALKPHOS 76  --   --   ALT 12*  --   --   AST 31  --   --   GLUCOSE 120*  < > 123*  ALBUMIN 3.0*  --   --   < > = values in this interval not displayed. Magnesium  Date Value Ref Range Status  01/28/2015 2.2 1.7 - 2.4 mg/dL Final    Recent Labs  04/07/16 1821  TROPONINI <0.03   B Natriuretic Peptide  Date/Time Value Ref Range Status  04/07/2016 12:10 AM 137.1 (H) 0.0 - 100.0 pg/mL Final  04/01/2016 04:26 AM 325.1 (H) 0.0 - 100.0 pg/mL Final   Lab Results  Component Value Date   CHOL 129 12/26/2015   HDL 38.10 (L) 12/26/2015   LDLCALC 73 12/26/2015   TRIG 86.0 12/26/2015   Vitamin B-12  Date/Time Value Ref Range Status  04/09/2016 06:00 PM 1,372 (H) 180 - 914 pg/mL Final   Folate  Date/Time Value Ref Range Status  04/09/2016 06:00 PM 56.7 >5.9 ng/mL Final   Ferritin  Date/Time Value Ref Range Status  04/09/2016 06:00 PM 417 (H) 24 - 336 ng/mL Final  10/03/2013 09:57 AM 377 (H) 22 - 316 ng/ml Final   TIBC  Date/Time Value Ref Range Status  04/09/2016 06:00 PM 167 (L) 250 - 450 ug/dL Final  10/03/2013 09:57 AM 230 202 - 409 ug/dL Final   Iron  Date/Time Value Ref Range Status  04/09/2016 06:00 PM 6 (L) 45 - 182 ug/dL Final  10/03/2013 09:57 AM 44 42 - 163 ug/dL Final   Retic %  Date/Time Value Ref  Range Status  11/16/2013 08:47 AM 2.89 (H) 0.80 - 1.80 % Final   Retic Ct Pct  Date/Time Value Ref Range Status  04/09/2016 06:00 PM 1.8 0.4 - 3.1 % Final   Echo: 12/2015 - Left ventricle: The cavity size was normal. Wall thickness was   normal. Systolic function was normal. The estimated ejection   fraction was in the range of 55% to 60%. Wall motion was normal;   there were no regional wall motion abnormalities. Doppler   parameters are consistent with abnormal left ventricular   relaxation (grade 1 diastolic dysfunction). -  Mitral valve: Calcified annulus. - Left atrium: The atrium was moderately dilated. - Right ventricle: The cavity size was mildly dilated. - Right atrium: The atrium was moderately dilated. - Atrial septum: There was an atrial septal aneurysm. - Pulmonary arteries: PA peak pressure: 33 mm Hg (S). Impressions: - Normal LV systolic function; grade 1 diastolic dysfunction;   moderate biatrial enlargement; mild RVE; mildly dilated aortic root.  ECG:  11/21 SR, 1st deg AV block, no acute ischemic changes  Radiology:  Dg Chest 2 View Result Date: 04/06/2016 CLINICAL DATA:  Shortness of breath. Sore back radiating to both flanks. Discharge from [facility] on Saturday with diagnosis of pneumonia. Continued fever and chills. EXAM: CHEST  2 VIEW COMPARISON:  04/01/2016 FINDINGS: Shallow inspiration with linear atelectasis in the lung bases. Cardiac enlargement. Diffuse airspace opacities in the lungs consistent with edema or multifocal pneumonia. Similar appearance to previous study. No blunting of costophrenic angles. No pneumothorax. Calcified aorta. Thoracic scoliosis convex towards the right. IMPRESSION: Cardiac enlargement. Diffuse airspace disease suggesting edema or pneumonia. Electronically Signed   By: Lucienne Capers M.D.   On: 04/06/2016 23:55   Ct Chest Wo Contrast Result Date: 04/01/2016 CLINICAL DATA:  Chest pain. Evaluate for aortic aneurysm. History of myeloproliferative disorder. EXAM: CT CHEST WITHOUT CONTRAST TECHNIQUE: Multidetector CT imaging of the chest was performed following the standard protocol without IV contrast. COMPARISON:  06/21/2014 FINDINGS: Cardiovascular: Ascending thoracic aorta measures up to 4.4 cm and stable. Atherosclerotic calcifications involving the posterior aortic arch. Proximal descending thoracic aorta measures 3.8 cm and stable. Mid descending thoracic aorta measures 3.4 cm and stable. Main pulmonary artery is markedly enlarged measuring 5 cm but  stable. Mediastinum/Nodes: Small mediastinal lymph nodes have not significantly changed. There is a 1 cm hypodense nodule or cyst in the right thyroid lobe. There is subcutaneous edema in the chest. No significant pericardial fluid. Lungs/Pleura: Trachea and mainstem bronchi are patent. Again noted is a mosaic attenuation pattern throughout the lungs. This pattern probably represents multiple areas of air trapping. These are prominent pulmonary vessels in the lung bases. Volume loss in the right lower lobe related to elevation of the right hemidiaphragm. There is some septal thickening in the upper lobes. No significant pleural effusions. Upper Abdomen: Again noted is an enlarged spleen which is only partially visualized. No acute abnormality in the upper abdomen. There is a high-density structure within the stomach lumen. Musculoskeletal: The visualized bones are diffusely sclerotic which is similar to the previous examination. There are small scattered lucent areas throughout the bones. Some of these lucent areas/lesions have enlarged from the prior examination. Lucent lesion along the posterior left sixth rib now measures 1.1 cm and previously measured 0.7 cm. No evidence for pathologic fracture. 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 Recommend annual imaging followup by CTA or MRA. Stable enlargement of pulmonary arteries compatible  with pulmonary hypertension. Mosaic attenuation pattern throughout both lungs. Similar finding was present on the previous examination. This is probably related to a combination of air trapping and mild edema. Bones are diffusely sclerotic with enlarged lucent lesions. 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 Splenomegaly. This is probably related to the history of myeloproliferative disorder. Electronically Signed   By: Markus Daft M.D.   On: 04/01/2016 11:14    ASSESSMENT AND PLAN:   The patient was seen today by Juan  Haroldine [name], the patient evaluated and the data reviewed.   Principal Problem: 1.  Acute on chronic CHF exacerbation (HCC) - pt has been difficult to diurese, po intake only 372 cc total yesterday, w/ net -1298 cc - wt is trending down.  - Lasix now 80 mg bid but volume has stabilized - anemia may make him SOB at baseline.  - continue to follow - general medical condition has significantly deteriorated   Active Problems: 2.  Myeloproliferative disease ([place]) - see CT results above - Oncology has been consulted  3.  Leukocytosis w/ recent PNA - on Levaquin - per IM  4.  Thoracic aortic aneurysm ([facility]) - see above CT results - per IM  5.  Community acquired pneumonia - on abx per IM  6.  Acute renal failure superimposed on chronic kidney disease ([place]) - Cr has not been this high in the past  7.  Thrombocytosis (HCC) - chronic, a little higher than average for him  8.  Anemia  - at his baseline - ++iron deficiency  Signed: Lenoard Aden 04/10/2016 1:14 PM Beeper [phone]  Co-Sign MD   Patient seen and examined with [name], PA-C. We discussed all aspects of the encounter. I agree with the assessment and plan as stated above.   I know Mr. Pompei well from the [facility] Clinic. He has had a rapid deterioration in the past few weeks which I suspect is related primarily to his malignancy. He now appears to be very close to the end of his life with muti-system organ failure. From HF perspective he is euvolemic and EF is normal. Would stop diuresis and use diuretics only prn. Strongly encourage palliative care involvement.   Unfortunately not many other recs from our perspective. We will follow from a distance.   Teona Vargus,MD 3:52 PM

## 2016-04-10 NOTE — Progress Notes (Signed)
Received consult on this patient w a working diagnosis of myelofibrosis. He was seen at [facility] two years ago,asfollows:  Mr. Silvio returns to my clinic to discuss the results of the workup; however, he told me that he had received a letter from "a local hospital in [place]", which requested him to go back to that hospital and have a second opinion obtained. He was not able to produce the letter; however, because of this, he refused to have a bone marrow biopsy and additional tests done during today's visit. He said that he would prefer to obtain that opinion. If he is not satisfied with the results of the workup in [place], he will return to our clinic.  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 He had 3% blasts in the peripheral blood, which was confirmed by flow cytometry. Mr. Tarnow potentially could be a candidate for treatment with Jakafi; however, he decided not to discuss these issues and return to [place]. He is welcome to call our clinic and make an appointment in the future if he decides to do so. There will be no charge for this visit.  Roxana Hires, MD Assistant Professor of Medicine Section on Hematology and Oncology 11/30/2013  According to Dr Larose Kells' note from 12/27/2015 this patient is followed by Hospice.  Not sure patient will be agreeable to bone marrow biopsy at this point. Plan to meet with him 04/11/2016 to discuss. Fullnote to follow

## 2016-04-10 NOTE — Progress Notes (Signed)
MD made aware about today's Iron, B12,  and Ferrtin lab results.  Will keep monitor.  Lashawn Orrego, RN

## 2016-04-10 NOTE — Progress Notes (Signed)
PROGRESS NOTE  Juan [name] ONG:[id number] DOB: 10-11-1940 DOA: 04/06/2016 PCP: [name], MD Brief History: 75 year old African-American male with a past medical history of coronary artery disease, diabetes, diastolic and systolic CHF, CAD, presented with complaints of SOB, weakness, cough and pain all over. Patient was just hospitalized from 11/15 - 11/18 for community-acquired pneumonia and treated with Levaquin. Patient was discharged home on Levaquin, but notes that he is on a fixed income and couldn't afford to fillthe prescription. Chest x-ray at the time of admission showed basilar atelectasis with diffuse airspace opacities concerning for pulmonary edema. The patient was started on intravenous furosemide with good clinical results.  Assessment/Plan: Acute on chronic systolic and diastolic CHF -44/05/270 echo EF 55-60%, grade 1 DD, PASP 33, mild RV dilatation -restart lasix 40 mg IV BID initially -Admission chest x-ray shows pulmonary vascular congestion and pulmonary edema -daily weights--dry weight ~194 -04/10/16 weight 198 -I/Os--1650cc out in past 24 hours -Patient remains clinically fluid overloaded -appreciate cardiology consult-->furosemide, hydralazine, imdur stopped -d/c hydralazine and imdur due to soft BPs  Pulmonary infiltrates -question if his clinical syndrome is consistent with pneumonia -suspect this is possible leukemic infiltrates -The patient was discharged on 04/04/2016 with instructionsto finish 5 more days levoflox -d/c levoflox after 11/23 dose -procalcitonin -repeat CXR in am  Acute on chronic renal failure--CKD 3 -Baseline creatinine 1.4-1.8 -Monitor with diuresis -serum creatinine peak 2.34  Myelodysplastic syndrome/Myelofibrosis -saw Heme at [facility] July 2017--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 He had 3% blasts in the peripheral blood, which was confirmed by flow cytometry -recommended Jakafi at that time, but  pt refused BM bx and was lost to follow up at [facility] -04/07/16 diff with increase WBC precursors -consulted hematology--spoke with Dr. Clyda Hurdle see 11/24 -iron sat 4 %, ferritin 417, B12--1372, folate 56.7 -He is afebrile and hemodynamically stable  Thrombocytosis -There is a component of myelofibrosis  Diabetes mellitus type 2, controlled -12/26/2015 and hemoglobin A1c 6.1 -Not on any oral agents outpt  GOC -remains full code -04/10/16--consulted palliative medicine   Disposition Plan: SNFvs residential hospice  Family Communication: NoFamily at bedside--Patient refuses to allow me to speak with family   Consultants: none  Code Status: FULL   DVT Prophylaxis: [place] Heparin    Procedures: As Listed in Progress Note Above  Antibiotics: Levofloxacin 11/20>>>11/23     Subjective: Patient denies fevers, chills, headache,nausea, vomiting, diarrhea, abdominal pain, dysuria, hematuria, hematochezia, and melena.  He has dyspnea with exertion   Objective: Vitals:   04/09/16 2146 04/10/16 0459 04/10/16 1048 04/10/16 1235  BP: (!) 104/59 114/63 110/63 122/72  Pulse: (!) 110 (!) 103 88 99  Resp:  18  18  Temp:  97.8 F (36.6 C)  97.6 F (36.4 C)  TempSrc:  Oral  Oral  SpO2: 98% 96% 95% 96%  Weight:  90 kg (198 lb 6.4 oz)    Height:        Intake/Output Summary (Last 24 hours) at 04/10/16 1833 Last data filed at 04/10/16 1709  Gross per 24 hour  Intake              492 ml  Output              940 ml  Net             -448 ml   Weight change:  Exam:   General:  Pt is alert, follows commands appropriately, not in acute distress  HEENT: No icterus, No thrush, No neck mass, [place]/AT  Cardiovascular: RRR, S1/S2, no rubs, no gallops  Respiratory: bibasilar crackles, no wheeze  Abdomen: Soft/+BS, non tender, non distended, no guarding  Extremities: 1 + LE edema, No lymphangitis, No petechiae, No rashes, no synovitis   Data  Reviewed: I have personally reviewed following labs and imaging studies Basic Metabolic Panel:  Recent Labs Lab 04/07/16 0010 04/07/16 0730 04/08/16 0420 04/09/16 0458 04/10/16 0528  NA 134* 134* 135 136 134*  K 4.1 4.1 4.0 4.2 4.8  CL 103 104 103 106 102  CO2 21* 21* 22 22 21*  GLUCOSE 120* 122* 105* 109* 123*  BUN 62* 60* 59* 58* 69*  CREATININE 2.22* 2.06* 2.02* 1.94* 2.34*  CALCIUM 9.7 9.6 9.3 9.4 9.3   Liver Function Tests:  Recent Labs Lab 04/07/16 0010  AST 31  ALT 12*  ALKPHOS 76  BILITOT 0.3  PROT 6.3*  ALBUMIN 3.0*   No results for input(s): LIPASE, AMYLASE in the last 168 hours. No results for input(s): AMMONIA in the last 168 hours. Coagulation Profile: No results for input(s): INR, PROTIME in the last 168 hours. CBC:  Recent Labs Lab 04/04/16 0525 04/07/16 0010 04/07/16 0532 04/08/16 0420 04/10/16 0528  WBC 11.4* 13.2* 10.6* 10.5 14.3*  NEUTROABS  --  6.9 6.8  --  9.2*  HGB 9.4* 9.1* 8.5* 8.1* 8.7*  HCT 30.7* 28.2* 26.8* 25.5* 26.5*  MCV 81.2 78.3 77.5* 78.0 77.7*  PLT 474* 578* 527* 484* 575*   Cardiac Enzymes:  Recent Labs Lab 04/07/16 0010 04/07/16 0532 04/07/16 1148 04/07/16 1821  TROPONINI 0.03* <0.03 0.10* <0.03   BNP: Invalid input(s): POCBNP CBG:  Recent Labs Lab 04/03/16 2022 04/04/16 0811 04/04/16 1151  GLUCAP 116* 99 94   HbA1C: No results for input(s): HGBA1C in the last 72 hours. Urine analysis:    Component Value Date/Time   COLORURINE YELLOW 04/07/2016 [place] 04/07/2016 0634   LABSPEC 1.012 04/07/2016 0634   PHURINE 5.0 04/07/2016 0634   GLUCOSEU NEGATIVE 04/07/2016 0634   GLUCOSEU NEGATIVE 05/02/2015 1008   HGBUR NEGATIVE 04/07/2016 0634   BILIRUBINUR NEGATIVE 04/07/2016 [place] 04/07/2016 0634   PROTEINUR 100 (A) 04/07/2016 0634   UROBILINOGEN 0.2 05/02/2015 1008   NITRITE NEGATIVE 04/07/2016 0634   LEUKOCYTESUR NEGATIVE 04/07/2016 0634   Sepsis  Labs: '@[id number]'$ (procalcitonin:4,lacticidven:4) ) Recent Results (from the past 240 hour(s))  Urine culture     Status: None   Collection Time: 04/01/16  2:40 AM  Result Value Ref Range Status   Specimen Description URINE, RANDOM  Final   Special Requests NONE  Final   Culture NO GROWTH  Final   Report Status 04/02/2016 FINAL  Final  Blood culture (routine x 2)     Status: None   Collection Time: 04/01/16  2:42 AM  Result Value Ref Range Status   Specimen Description BLOOD RIGHT ARM  Final   Special Requests BOTTLES DRAWN AEROBIC AND ANAEROBIC 5ML  Final   Culture NO GROWTH 5 DAYS  Final   Report Status 04/06/2016 FINAL  Final  Blood culture (routine x 2)     Status: None   Collection Time: 04/01/16  4:26 AM  Result Value Ref Range Status   Specimen Description BLOOD LEFT HAND  Final   Special Requests IN PEDIATRIC BOTTLE 4ML  Final   Culture NO GROWTH 5 DAYS  Final   Report Status 04/06/2016 FINAL  Final  Culture, sputum-assessment  Status: None   Collection Time: 04/01/16  8:42 AM  Result Value Ref Range Status   Specimen Description EXPECTORATED SPUTUM  Final   Special Requests NONE  Final   Sputum evaluation   Final    THIS SPECIMEN IS ACCEPTABLE. RESPIRATORY CULTURE REPORT TO FOLLOW.   Report Status 04/01/2016 FINAL  Final  Culture, respiratory (NON-Expectorated)     Status: None   Collection Time: 04/01/16  8:42 AM  Result Value Ref Range Status   Specimen Description EXPECTORATED SPUTUM  Final   Special Requests NONE  Final   Gram Stain   Final    NO WBC SEEN MODERATE GRAM POSITIVE COCCI IN PAIRS IN SINGLES MODERATE GRAM NEGATIVE RODS MODERATE GRAM VARIABLE ROD MODERATE GRAM POSITIVE RODS MODERATE YEAST FEW SQUAMOUS EPITHELIAL CELLS PRESENT    Culture Consistent with normal respiratory flora.  Final   Report Status 04/03/2016 FINAL  Final     Scheduled Meds: . aspirin  325 mg Oral Daily  . carvedilol  6.25 mg Oral BID WC  . feeding supplement (ENSURE  ENLIVE)  237 mL Oral BID PC  . ferrous gluconate  324 mg Oral Q2200  . fluticasone  2 spray Each Nare Daily  . guaiFENesin  1,200 mg Oral BID  . heparin  5,000 Units Subcutaneous Q8H  . levofloxacin  750 mg Oral Q48H  . multivitamin with minerals  1 tablet Oral Daily  . sodium chloride flush  3 mL Intravenous Q12H   Continuous Infusions:  Procedures/Studies: Dg Chest 2 View  Result Date: 04/06/2016 CLINICAL DATA:  Shortness of breath. Sore back radiating to both flanks. Discharge from [facility] on Saturday with diagnosis of pneumonia. Continued fever and chills. EXAM: CHEST  2 VIEW COMPARISON:  04/01/2016 FINDINGS: Shallow inspiration with linear atelectasis in the lung bases. Cardiac enlargement. Diffuse airspace opacities in the lungs consistent with edema or multifocal pneumonia. Similar appearance to previous study. No blunting of costophrenic angles. No pneumothorax. Calcified aorta. Thoracic scoliosis convex towards the right. IMPRESSION: Cardiac enlargement. Diffuse airspace disease suggesting edema or pneumonia. Electronically Signed   By: Lucienne Capers M.D.   On: 04/06/2016 23:55   Dg Chest 2 View  Result Date: 04/01/2016 CLINICAL DATA:  Acute onset of fever, cough and body aches. Initial encounter. EXAM: CHEST  2 VIEW COMPARISON:  Chest radiograph performed 12/29/2015 FINDINGS: The lungs are well-aerated. Vascular congestion is noted. Bilateral central airspace opacities may reflect pneumonia or pulmonary edema. No pleural effusion or pneumothorax is seen. The heart is mildly enlarged. No acute osseous abnormalities are seen. IMPRESSION: Vascular congestion and mild cardiomegaly. Bilateral central airspace opacities may reflect pneumonia or pulmonary edema. Electronically Signed   By: Garald Balding M.D.   On: 04/01/2016 03:08   Ct Chest Wo Contrast  Result Date: 04/01/2016 CLINICAL DATA:  Chest pain. Evaluate for aortic aneurysm. History of myeloproliferative  disorder. EXAM: CT CHEST WITHOUT CONTRAST TECHNIQUE: Multidetector CT imaging of the chest was performed following the standard protocol without IV contrast. COMPARISON:  06/21/2014 FINDINGS: Cardiovascular: Ascending thoracic aorta measures up to 4.4 cm and stable. Atherosclerotic calcifications involving the posterior aortic arch. Proximal descending thoracic aorta measures 3.8 cm and stable. Mid descending thoracic aorta measures 3.4 cm and stable. Main pulmonary artery is markedly enlarged measuring 5 cm but stable. Mediastinum/Nodes: Small mediastinal lymph nodes have not significantly changed. There is a 1 cm hypodense nodule or cyst in the right thyroid lobe. There is subcutaneous edema in the chest. No significant pericardial  fluid. Lungs/Pleura: Trachea and mainstem bronchi are patent. Again noted is a mosaic attenuation pattern throughout the lungs. This pattern probably represents multiple areas of air trapping. These are prominent pulmonary vessels in the lung bases. Volume loss in the right lower lobe related to elevation of the right hemidiaphragm. There is some septal thickening in the upper lobes. No significant pleural effusions. Upper Abdomen: Again noted is an enlarged spleen which is only partially visualized. No acute abnormality in the upper abdomen. There is a high-density structure within the stomach lumen. Musculoskeletal: The visualized bones are diffusely sclerotic which is similar to the previous examination. There are small scattered lucent areas throughout the bones. Some of these lucent areas/lesions have enlarged from the prior examination. Lucent lesion along the posterior left sixth rib now measures 1.1 cm and previously measured 0.7 cm. No evidence for pathologic fracture. 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 Recommend annual imaging followup by CTA or MRA. This recommendation follows 2010 ACCF/AHA/AATS/ACR/ASA/SCA/SCAI/SIR/STS/SVM  Guidelines for the Diagnosis and Management of Patients with Thoracic Aortic Disease. Circulation. 2010; 121: E454-U981 Stable enlargement of pulmonary arteries compatible with pulmonary hypertension. Mosaic attenuation pattern throughout both lungs. Similar finding was present on the previous examination. This is probably related to a combination of air trapping and mild edema. Bones are diffusely sclerotic with enlarged lucent lesions. 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 Splenomegaly. This is probably related to the history of myeloproliferative disorder. Electronically Signed   By: Markus Daft M.D.   On: 04/01/2016 11:14    Machelle Raybon, DO  [facility] Pager [phone]  If 7PM-7AM, please contact night-coverage [email or web address] Password [facility] 04/10/2016, 6:33 PM   LOS: 2 days

## 2016-04-11 DIAGNOSIS — Z7189 Other specified counseling: Secondary | ICD-10-CM

## 2016-04-11 DIAGNOSIS — D473 Essential (hemorrhagic) thrombocythemia: Secondary | ICD-10-CM

## 2016-04-11 DIAGNOSIS — D72829 Elevated white blood cell count, unspecified: Secondary | ICD-10-CM

## 2016-04-11 DIAGNOSIS — D649 Anemia, unspecified: Secondary | ICD-10-CM

## 2016-04-11 DIAGNOSIS — Z515 Encounter for palliative care: Secondary | ICD-10-CM

## 2016-04-11 LAB — BASIC METABOLIC PANEL
ANION GAP: 12 (ref 5–15)
BUN: 80 mg/dL — ABNORMAL HIGH (ref 6–20)
CALCIUM: 9.4 mg/dL (ref 8.9–10.3)
CHLORIDE: 103 mmol/L (ref 101–111)
CO2: 19 mmol/L — AB (ref 22–32)
Creatinine, Ser: 2.34 mg/dL — ABNORMAL HIGH (ref 0.61–1.24)
GFR calc non Af Amer: 26 mL/min — ABNORMAL LOW (ref >60)
GFR, EST AFRICAN AMERICAN: 30 mL/min — AB (ref >60)
Glucose, Bld: 115 mg/dL — ABNORMAL HIGH (ref 65–99)
Potassium: 5.4 mmol/L — ABNORMAL HIGH (ref 3.5–5.1)
Sodium: 134 mmol/L — ABNORMAL LOW (ref 135–145)

## 2016-04-11 LAB — CBC WITH DIFFERENTIAL/PLATELET
BASOS PCT: 2 %
Basophils Absolute: 0.3 10*3/uL — ABNORMAL HIGH (ref 0.0–0.1)
EOS ABS: 0.3 10*3/uL (ref 0.0–0.7)
EOS PCT: 2 %
HCT: 27.2 % — ABNORMAL LOW (ref 39.0–52.0)
Hemoglobin: 8.9 g/dL — ABNORMAL LOW (ref 13.0–17.0)
LYMPHS PCT: 19 %
Lymphs Abs: 2.9 10*3/uL (ref 0.7–4.0)
MCH: 25.1 pg — ABNORMAL LOW (ref 26.0–34.0)
MCHC: 32.7 g/dL (ref 30.0–36.0)
MCV: 76.8 fL — AB (ref 78.0–100.0)
Monocytes Absolute: 2 10*3/uL — ABNORMAL HIGH (ref 0.1–1.0)
Monocytes Relative: 13 %
NEUTROS PCT: 64 %
Neutro Abs: 9.5 10*3/uL — ABNORMAL HIGH (ref 1.7–7.7)
PLATELETS: 498 10*3/uL — AB (ref 150–400)
RBC: 3.54 MIL/uL — ABNORMAL LOW (ref 4.22–5.81)
RDW: 19.2 % — ABNORMAL HIGH (ref 11.5–15.5)
WBC: 15 10*3/uL — ABNORMAL HIGH (ref 4.0–10.5)

## 2016-04-11 MED ORDER — SODIUM POLYSTYRENE SULFONATE 15 GM/60ML PO SUSP
30.0000 g | Freq: Once | ORAL | Status: AC
  Administered 2016-04-11: 30 g via ORAL
  Filled 2016-04-11: qty 120

## 2016-04-11 MED ORDER — CALCIUM CARBONATE ANTACID 500 MG PO CHEW
2.0000 | CHEWABLE_TABLET | Freq: Two times a day (BID) | ORAL | Status: DC | PRN
  Administered 2016-04-11: 400 mg via ORAL
  Filled 2016-04-11: qty 2

## 2016-04-11 MED ORDER — ALUM & MAG HYDROXIDE-SIMETH 200-200-20 MG/5ML PO SUSP
15.0000 mL | ORAL | Status: DC | PRN
  Administered 2016-04-17: 15 mL via ORAL
  Filled 2016-04-11 (×2): qty 30

## 2016-04-11 MED ORDER — DICLOFENAC SODIUM 1 % TD GEL
2.0000 g | Freq: Every day | TRANSDERMAL | Status: DC | PRN
  Administered 2016-04-11 – 2016-04-17 (×2): 2 g via TOPICAL
  Filled 2016-04-11: qty 100

## 2016-04-11 NOTE — Consult Note (Signed)
[place]  [phone]) [phone] Fax:(336) [phone]     ID: Matas Burrows DOB: 19-Oct-1940  MR#: [id number]  JYN#:[id number]  Patient Care Team: Colon Branch, MD as PCP - General Roxana Hires, MD as Consulting Physician (Hematology and Oncology) [name], MD as Consulting Physician (Cardiology) [name], MD as Consulting Physician (Hematology and Oncology) PCP: [name], MD Chauncey Cruel, MD GYN: SU:  OTHER MD:  CHIEF COMPLAINT: leukocytosis, thrombocytosis, anemia  CURRENT TREATMENT: observation  RESEARCH PROTOCOL: no  HPI:  He has had multiple workups for anemia with unremarkable ferritin, B-12 and folate levels, On 12/23/2012 his WBC was 12.7, Hb 10.3, MCV 81.1 and platelets 210K. Smear review at that time showed rare blasts, NRBCs and teardrops.  On 09/08/2013 WBC was 17.6, Hb 9.8 with MCV 79.7 and platelets 276K. Smear review showed a left shift in the WBC including blasts, promyelocytes and myelocytes, and NRBCs. At that time a heme consult was placed with Dr [name] who offered the patient bone marrow biopsy, which he refused. Her workinhg Dx was myeloproliferative syndrome, with patient at that time being on anagrelide (controlling the thrombocytosis).  Dr [name] adds "I had the opportunity to review his CBC from 2007. He was noted to have recurrent leukocytosis since December of 2013. In July 2014, his white blood cell count went back to normal. Starting around August 2014, it became chronically elevated ranging from 12.4 to as high as 20.5. The patient also have recurrent thrombocytosis. According to the patient, he had extensive investigations at [place] and was placed on anagrelide. In 2007, his blood count was as high as 669,000. Since then, it has fluctuated between normal range to 472,000. 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 He had 3% blasts in the peripheral blood, which was confirmed by flow cytometry. Mr. Kimmons potentially could be a candidate for treatment with Jakafi; however, he decided not to discuss these issues and return to [facility]."  As far as I can tell Mr Whiteley was last seen by hematology (this time Dr Marin Olp) 07/30/2014. He comments "Mr. Maberry is 75 year old African-American gentleman. He has a known myeloproliferative disorder. He's been tested for BCR/ABL and JAK2 in the past. He's been negative. Again, he has refused any type of workup.  By his peripheral blood smear, he is probably transforming over into an acute myeloid process. He does have some myeloblasts on the smear. Again, we will not know if he truly is leukemic as he will not allow [place] to do any other studies. His performance status is not that great so I can somehow understand not being aggressive here and being able to intervene."  His subsequent history is as detailed below  INTERVAL HISTORY: Mr Deeb was admitted with PNA/ CHF 04/07/2016 after disharge 04/04/2016 with similar complaints.  REVIEW OF SYSTEMS: He c/o pain in his back and legs. Denies h/a, N/V. Thinks his breathing is better. Is able to give me a coherent story regarding his home situation. No family in room  No Known Allergies  Current Facility-Administered Medications  Medication Dose Route Frequency Provider Last Rate Last Dose  . 0.9 %  sodium chloride infusion  250 mL Intravenous PRN Norval Morton, MD      .  acetaminophen (TYLENOL) tablet 650 mg  650 mg Oral Q4H PRN Norval Morton, MD   650 mg at 04/10/16 1701  . aspirin tablet 325 mg  325 mg Oral Daily Rondell Charmayne Sheer, MD   325 mg at 04/10/16 1050  . carvedilol (COREG) tablet 6.25 mg  6.25 mg Oral BID WC Rondell Charmayne Sheer, MD   6.25 mg at 04/11/16 0725  .  feeding supplement (ENSURE ENLIVE) (ENSURE ENLIVE) liquid 237 mL  237 mL Oral BID PC Maryann Mikhail, DO   237 mL at 04/10/16 1705  . ferrous gluconate (FERGON) tablet 324 mg  324 mg Oral Q2200 Norval Morton, MD   324 mg at 04/10/16 2141  . fluticasone (FLONASE) 50 MCG/ACT nasal spray 2 spray  2 spray Each Nare Daily Norval Morton, MD   2 spray at 04/10/16 1042  . gabapentin (NEURONTIN) capsule 100 mg  100 mg Oral TID PRN Norval Morton, MD   100 mg at 04/09/16 1332  . guaiFENesin (MUCINEX) 12 hr tablet 1,200 mg  1,200 mg Oral BID Norval Morton, MD   1,200 mg at 04/10/16 2141  . heparin injection 5,000 Units  5,000 Units Subcutaneous Q8H Norval Morton, MD   5,000 Units at 04/11/16 0650  . HYDROcodone-acetaminophen (NORCO/VICODIN) 5-325 MG per tablet 1 tablet  1 tablet Oral Q6H PRN Norval Morton, MD   1 tablet at 04/11/16 0732  . ipratropium-albuterol (DUONEB) 0.5-2.5 (3) MG/3ML nebulizer solution 3 mL  3 mL Nebulization Q2H PRN Norval Morton, MD      . levofloxacin (LEVAQUIN) tablet 750 mg  750 mg Oral Q48H Rondell Charmayne Sheer, MD   750 mg at 04/09/16 2148  . multivitamin with minerals tablet 1 tablet  1 tablet Oral Daily Maryann Mikhail, DO   1 tablet at 04/10/16 1050  . MUSCLE RUB CREA   Topical PRN Orson Eva, MD      . ondansetron [facility]) injection 4 mg  4 mg Intravenous Q6H PRN Norval Morton, MD   4 mg at 04/10/16 2336  . simethicone (MYLICON) chewable tablet 80 mg  80 mg Oral Q6H PRN Norval Morton, MD   80 mg at 04/10/16 2314  . sodium chloride flush (NS) 0.9 % injection 3 mL  3 mL Intravenous Q12H Norval Morton, MD   3 mL at 04/10/16 2143  . sodium chloride flush (NS) 0.9 % injection 3 mL  3 mL Intravenous PRN Norval Morton, MD        PAST MEDICAL HISTORY: Past Medical History:  Diagnosis Date  . Allergic rhinitis   . Anemia   . Ascending aortic aneurysm ([place]) 03/2014   4.3cm on CT scan  . CAD (coronary artery disease)    dx elsewheer in past, no documentation.  Non-ischemic myoview 2007  . Chronic diastolic CHF (congestive heart failure), NYHA class 2 (HCC)    Normal EF w/ grade 1 dd by echo 12/2015  . Edema    R>L leg, u/s 5-12 neg for DVT  . Hemorrhoid   . History of thrombocytosis   . Hypertension   . Migraine    "once/wk at least" (07/11/2013)  . Myeloproliferative disease ([place])   . Shortness of breath   . Sinus congestion   . Sleep apnea, obstructive    at some point used CPAP, was d/c  years ago  . Type II diabetes mellitus ([place])     PAST SURGICAL HISTORY: Past Surgical History:  Procedure  Laterality Date  . TOE SURGERY Right    "tried to straighten out big toe" (07/11/2013)    FAMILY HISTORY Family History  Problem Relation Age of Onset  . Schizophrenia Son   . Colon cancer Neg Hx   . Prostate cancer Neg Hx   . Heart attack Neg Hx   . Diabetes Neg Hx    SOCIAL HISTORY:  The patient lives with his disabled son, who "helps me out." There is another son, a daughter and a grandson all of whom "come by and do anything I need." -- Note Dr Doristine Devoid copmment that patient does not want his family notifid of his current situation    ADVANCED DIRECTIVES: I asked the patient if he had a HCPOA and he tells me he does not have property so he does not need a Chief Executive Officer. Very preliminary discussion re advanced directives today  HEALTH MAINTENANCE: Social History  Substance Use Topics  . Smoking status: Former Smoker    Packs/day: 0.25    Years: 12.00    Types: Cigarettes    Quit date: 05/18/1966  . Smokeless tobacco: Never Used     Comment: quit smoking 45 years ago  . Alcohol use No     OBJECTIVE: older African American man examined in bed Vitals:   04/10/16 2100 04/11/16 0751  BP: (!) 115/56 116/60  Pulse: (!) 104 100  Resp: (!) 22 20  Temp: 97.9 F (36.6 C) 97.8 F (36.6 C)     Body mass index is 26.15 kg/m.     Lungs: no rhonchi or wheezes, auscultated anterolaterally Heart: RRR Abd: soft, +BS, Foley in place Neuro:  nonfocal; clear speech, very fatigued  LAB RESULTS:  CMP     Component Value Date/Time   NA 134 (L) 04/11/2016 0336   NA 138 07/31/2014   NA 143 07/30/2014 1202   NA 138 11/16/2013 0847   K 5.4 (H) 04/11/2016 0336   K 5.1 (H) 07/30/2014 1202   K 4.5 11/16/2013 0847   CL 103 04/11/2016 0336   CL 105 07/30/2014 1202   CO2 19 (L) 04/11/2016 0336   CO2 28 07/30/2014 1202   CO2 22 11/16/2013 0847   GLUCOSE 115 (H) 04/11/2016 0336   GLUCOSE 109 07/30/2014 1202   BUN 80 (H) 04/11/2016 0336   BUN 23 (A) 07/31/2014   BUN 21 07/30/2014 1202   BUN 20.2 11/16/2013 0847   CREATININE 2.34 (H) 04/11/2016 0336   CREATININE 1.3 (H) 07/30/2014 1202   CREATININE 1.2 11/16/2013 0847   CALCIUM 9.4 04/11/2016 0336   CALCIUM 9.6 07/30/2014 1202   CALCIUM 9.8 11/16/2013 0847   PROT 6.3 (L) 04/07/2016 0010   PROT 7.0 07/30/2014 1202   PROT 6.7 11/16/2013 0847   ALBUMIN 3.0 (L) 04/07/2016 0010   ALBUMIN 3.2 (L) 07/30/2014 1202   ALBUMIN 3.4 (L) 11/16/2013 0847   AST 31 04/07/2016 0010   AST 30 07/30/2014 1202   AST 25 11/16/2013 0847   ALT 12 (L) 04/07/2016 0010   ALT 10 07/30/2014 1202   ALT 11 11/16/2013 0847   ALKPHOS 76 04/07/2016 0010   ALKPHOS 88 (H) 07/30/2014 1202   ALKPHOS 125 11/16/2013 0847   BILITOT 0.3 04/07/2016 0010   BILITOT 0.50 07/30/2014 1202   BILITOT 0.35 11/16/2013 0847   GFRNONAA 26 (L) 04/11/2016 0336   GFRAA 30 (L) 04/11/2016 0336    No results found for: KPAFRELGTCHN, LAMBDASER, KAPLAMBRATIO  No results found for: TOTALPROTELP, ALBUMINELP, A1GS, A2GS, BETS, BETA2SER,  GAMS, MSPIKE, SPEI  Lab Results  Component Value Date   WBC 15.0 (H) 04/11/2016   NEUTROABS 9.5 (H) 04/11/2016   HGB 8.9 (L) 04/11/2016   HCT 27.2 (L) 04/11/2016   MCV 76.8 (L) 04/11/2016   PLT 498 (H) 04/11/2016    '@LASTCHEMISTRY'$ @  No results found for: LABCA2  No components found for: [id number]  No results for input(s): INR in the last 168 hours.  Urinalysis    Component Value  Date/Time   COLORURINE YELLOW 04/07/2016 [place] 04/07/2016 0634   LABSPEC 1.012 04/07/2016 0634   PHURINE 5.0 04/07/2016 0634   GLUCOSEU NEGATIVE 04/07/2016 0634   GLUCOSEU NEGATIVE 05/02/2015 1008   HGBUR NEGATIVE 04/07/2016 [place] 04/07/2016 [place] 04/07/2016 0634   PROTEINUR 100 (A) 04/07/2016 0634   UROBILINOGEN 0.2 05/02/2015 1008   NITRITE NEGATIVE 04/07/2016 0634   LEUKOCYTESUR NEGATIVE 04/07/2016 0634    RADIOLOGY AND OTHER STUDIES: Dg Chest 2 View  Result Date: 04/06/2016 CLINICAL DATA:  Shortness of breath. Sore back radiating to both flanks. Discharge from [facility] on Saturday with diagnosis of pneumonia. Continued fever and chills. EXAM: CHEST  2 VIEW COMPARISON:  04/01/2016 FINDINGS: Shallow inspiration with linear atelectasis in the lung bases. Cardiac enlargement. Diffuse airspace opacities in the lungs consistent with edema or multifocal pneumonia. Similar appearance to previous study. No blunting of costophrenic angles. No pneumothorax. Calcified aorta. Thoracic scoliosis convex towards the right. IMPRESSION: Cardiac enlargement. Diffuse airspace disease suggesting edema or pneumonia. Electronically Signed   By: Lucienne Capers M.D.   On: 04/06/2016 23:55   Dg Chest 2 View  Result Date: 04/01/2016 CLINICAL DATA:  Acute onset of fever, cough and body aches. Initial encounter. EXAM: CHEST  2 VIEW COMPARISON:  Chest radiograph performed 12/29/2015 FINDINGS: The lungs are well-aerated. Vascular congestion is noted. Bilateral central airspace opacities may reflect pneumonia or pulmonary edema. No pleural effusion or pneumothorax is seen. The heart is mildly enlarged. No acute osseous abnormalities are seen. IMPRESSION: Vascular congestion and mild cardiomegaly. Bilateral central airspace opacities may reflect pneumonia or pulmonary edema. Electronically Signed   By: Garald Balding M.D.   On: 04/01/2016 03:08     Ct Chest Wo Contrast  Result Date: 04/01/2016 CLINICAL DATA:  Chest pain. Evaluate for aortic aneurysm. History of myeloproliferative disorder. EXAM: CT CHEST WITHOUT CONTRAST TECHNIQUE: Multidetector CT imaging of the chest was performed following the standard protocol without IV contrast. COMPARISON:  06/21/2014 FINDINGS: Cardiovascular: Ascending thoracic aorta measures up to 4.4 cm and stable. Atherosclerotic calcifications involving the posterior aortic arch. Proximal descending thoracic aorta measures 3.8 cm and stable. Mid descending thoracic aorta measures 3.4 cm and stable. Main pulmonary artery is markedly enlarged measuring 5 cm but stable. Mediastinum/Nodes: Small mediastinal lymph nodes have not significantly changed. There is a 1 cm hypodense nodule or cyst in the right thyroid lobe. There is subcutaneous edema in the chest. No significant pericardial fluid. Lungs/Pleura: Trachea and mainstem bronchi are patent. Again noted is a mosaic attenuation pattern throughout the lungs. This pattern probably represents multiple areas of air trapping. These are prominent pulmonary vessels in the lung bases. Volume loss in the right lower lobe related to elevation of the right hemidiaphragm. There is some septal thickening in the upper lobes. No significant pleural effusions. Upper Abdomen: Again noted is an enlarged spleen which is only partially visualized. No acute abnormality in the upper abdomen. There is a high-density structure within the  stomach lumen. Musculoskeletal: The visualized bones are diffusely sclerotic which is similar to the previous examination. There are small scattered lucent areas throughout the bones. Some of these lucent areas/lesions have enlarged from the prior examination. Lucent lesion along the posterior left sixth rib now measures 1.1 cm and previously measured 0.7 cm. No evidence for pathologic fracture. IMPRESSION: Stable aneurysm of the ascending thoracic aorta measuring  up to 4.4 cm. Recommend annual imaging followup by CTA or MRA. This recommendation follows 2010 ACCF/AHA/AATS/ACR/ASA/SCA/SCAI/SIR/STS/SVM Guidelines for the Diagnosis and Management of Patients with Thoracic Aortic Disease. Circulation. 2010; 121: Q734-L937 Stable enlargement of pulmonary arteries compatible with pulmonary hypertension. Mosaic attenuation pattern throughout both lungs. Similar finding was present on the previous examination. This is probably related to a combination of air trapping and mild edema. Bones are diffusely sclerotic with enlarged lucent lesions. 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 Splenomegaly. This is probably related to the history of myeloproliferative disorder. Electronically Signed   By: Markus Daft M.D.   On: 04/01/2016 11:14   Dg Chest Port 1 View  Result Date: 04/10/2016 CLINICAL DATA:  Acute respiratory failure with hypoxia. Pulmonary edema versus other infectious or leukemic infiltrates. EXAM: PORTABLE CHEST 1 VIEW COMPARISON:  Chest x-rays dated 04/06/2016, 04/01/2016, 12/29/2015 and 01/21/2015. FINDINGS: No significant change compared to the most recent chest x-ray of 04/06/2016, with persistent bilateral perihilar and bibasilar opacities compatible with either pulmonary edema or multifocal pneumonia. No new lung findings. No pleural effusions seen. Heart size and mediastinal contours are stable. Atherosclerotic changes again noted at the aortic arch. Osseous structures appear mottled, particularly about each shoulder, compatible with the mixed sclerotic and lucent lesions described on earlier chest CT of 04/01/2016. IMPRESSION: 1. Persistent perihilar and bibasilar airspace opacities, unchanged compared to the most recent chest x-ray of 04/06/2016, compatible with either pulmonary edema or multifocal pneumonia, favor pulmonary edema. No new lung findings. 2. Mottled appearance of the osseous structures, particularly prominent about each shoulder,  compatible with the mixed sclerotic and lucent lesions described on chest CT of 04/01/2016, highly suspicious for metastatic disease. 3. Aortic atherosclerosis. Electronically Signed   By: Franki Cabot M.D.   On: 04/10/2016 19:06      ASSESSMENT: 75 y.o. 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 A bone marrow biopsy would be useful but he refuses. I will set him up for flow cytometry on 11/27 AM, and will review results with him 04/15/2016. I do NOT expect to document transformation to acute leukemia.  He has sclerotic bone lesions which may be contributing to his pain. Last PSA I can find dates from 2015; will repeat.  I think this patient will benefit from Palliative Team consultation. He does not have a good understanding of advanced directive options. There is also a question how much his family understands of his situation.   Will follow with you.  Chauncey Cruel, MD   04/11/2016 8:15 AM Medical Oncology and Hematology [facility] [address] [place], [place] [address] Tel. [phone]    Fax. [phone]

## 2016-04-11 NOTE — Progress Notes (Signed)
PROGRESS NOTE  Juan [name] JZP:[id number] DOB: 1940/05/21 DOA: 04/06/2016 PCP: [name], MD  Brief History: 75 year old African-American male with a past medical history of coronary artery disease, diabetes, diastolic and systolic CHF, CAD, presented with complaints of SOB, weakness, cough and pain all over. Patient was just hospitalized from 11/15 - 11/18 for community-acquired pneumonia and treated with Levaquin. Patient was discharged home on Levaquin, but notes that he is on a fixed income and couldn't afford to fillthe prescription. Chest x-ray at the time of admission showed basilar atelectasis with diffuse airspace opacities concerning for pulmonary edema. The patient was started on intravenous furosemide with good clinical results.  Assessment/Plan: Acute on chronic systolic and diastolic CHF -48/05/6551 echo EF 55-60%, grade 1 DD, PASP 33, mild RV dilatation -restart lasix 40 mg IV BID initially -Admission chest x-ray shows pulmonary vascular congestion and pulmonary edema -daily weights--dry weight ~194 -04/10/16 weight 198 -appreciate cardiology consult-->furosemide, hydralazine, imdur stopped 11/24 -d/c hydralazine and imdur due to soft BPs  Pulmonary infiltrates -question if his clinical syndrome is consistent with pneumonia -suspect this is possible leukemic infiltrates -The patient was discharged on 04/04/2016 with instructionsto finish 5 more days levoflox -d/c levoflox after 11/25 dose -procalcitonin 0.43-->3.16 -04/10/16 repeat CXR--stable infiltrates  Acute on chronic renal failure--CKD 3 -Baseline creatinine 1.4-1.8 -Monitor with diuresis -serum creatinine peak 2.34  Myelodysplastic syndrome/Myelofibrosis -saw Heme at [facility] July 2017--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 He had 3% blasts in the peripheral blood, which was confirmed by flow cytometry -recommended Jakafi at that time, but pt refused BM bx and was lost to follow  up at [facility] -04/07/16 diff with increase WBC precursors -consulted hematology-- Dr. Melissa Montane flow cytometry on 11/27 -iron sat 4 %, ferritin 417, B12--1372, folate 56.7 -He is afebrile and hemodynamically stable  Thrombocytosis -There is a component of myelofibrosis  Diabetes mellitus type 2, controlled -12/26/2015 and hemoglobin A1c 6.1 -Not on any oral agents outpt  Hyperkalemia -kayexalate x 1  GOC -remains full code--discussed with patient -04/11/16--family in room; pt refuses to allow me to speak with them -04/10/16--consulted palliative medicine -very poor insight into severity of his medical conditions   Disposition Plan: SNFvs residential hospice on 04/13/16 Family Communication: Patient refuses to allow me to speak with family at bedside--left room prior to my exam   Consultants: none  Code Status: FULL   DVT Prophylaxis: [place] Heparin    Procedures: As Listed in Progress Note Above  Antibiotics: Levofloxacin 11/20>>>11/25   Subjective: Patient complains of pain all over but it is controlled with oral opioids. Breathing overall is improved. Denies any fevers, chills, nausea, vomiting, diarrhea, abdominal pain, dysuria, hematuria. No rashes.  Objective: Vitals:   04/10/16 1235 04/10/16 2100 04/11/16 0751 04/11/16 1200  BP: 122/72 (!) 115/56 116/60 (!) 112/59  Pulse: 99 (!) 104 100 91  Resp: 18 (!) '22 20 18  '$ Temp: 97.6 F (36.4 C) 97.9 F (36.6 C) 97.8 F (36.6 C) 97.7 F (36.5 C)  TempSrc: Oral Oral Oral Oral  SpO2: 96% 94% 97% 95%  Weight:   94.9 kg (209 lb 3.2 oz)   Height:        Intake/Output Summary (Last 24 hours) at 04/11/16 1640 Last data filed at 04/11/16 1509  Gross per 24 hour  Intake              403 ml  Output  1050 ml  Net             -647 ml   Weight change:  Exam:   General:  Pt is alert, follows commands appropriately, not in acute distress  HEENT: No icterus, No thrush, No  neck mass, Neche/AT  Cardiovascular: RRR, S1/S2, no rubs, no gallops  Respiratory: Scattered bilateral rales with good air movement. No wheezing.  Abdomen: Soft/+BS, non tender, non distended, no guarding  Extremities: 1 + LE edema, No lymphangitis, No petechiae, No rashes, no synovitis   Data Reviewed: I have personally reviewed following labs and imaging studies Basic Metabolic Panel:  Recent Labs Lab 04/07/16 0730 04/08/16 0420 04/09/16 0458 04/10/16 0528 04/11/16 0336  NA 134* 135 136 134* 134*  K 4.1 4.0 4.2 4.8 5.4*  CL 104 103 106 102 103  CO2 21* 22 22 21* 19*  GLUCOSE 122* 105* 109* 123* 115*  BUN 60* 59* 58* 69* 80*  CREATININE 2.06* 2.02* 1.94* 2.34* 2.34*  CALCIUM 9.6 9.3 9.4 9.3 9.4   Liver Function Tests:  Recent Labs Lab 04/07/16 0010  AST 31  ALT 12*  ALKPHOS 76  BILITOT 0.3  PROT 6.3*  ALBUMIN 3.0*   No results for input(s): LIPASE, AMYLASE in the last 168 hours. No results for input(s): AMMONIA in the last 168 hours. Coagulation Profile: No results for input(s): INR, PROTIME in the last 168 hours. CBC:  Recent Labs Lab 04/07/16 0010 04/07/16 0532 04/08/16 0420 04/10/16 0528 04/11/16 0336  WBC 13.2* 10.6* 10.5 14.3* 15.0*  NEUTROABS 6.9 6.8  --  9.2* 9.5*  HGB 9.1* 8.5* 8.1* 8.7* 8.9*  HCT 28.2* 26.8* 25.5* 26.5* 27.2*  MCV 78.3 77.5* 78.0 77.7* 76.8*  PLT 578* 527* 484* 575* 498*   Cardiac Enzymes:  Recent Labs Lab 04/07/16 0010 04/07/16 0532 04/07/16 1148 04/07/16 1821  TROPONINI 0.03* <0.03 0.10* <0.03   BNP: Invalid input(s): POCBNP CBG: No results for input(s): GLUCAP in the last 168 hours. HbA1C: No results for input(s): HGBA1C in the last 72 hours. Urine analysis:    Component Value Date/Time   COLORURINE YELLOW 04/07/2016 [place] 04/07/2016 0634   LABSPEC 1.012 04/07/2016 0634   PHURINE 5.0 04/07/2016 0634   GLUCOSEU NEGATIVE 04/07/2016 [place] 05/02/2015 1008   HGBUR  NEGATIVE 04/07/2016 [place] 04/07/2016 0634   KETONESUR NEGATIVE 04/07/2016 0634   PROTEINUR 100 (A) 04/07/2016 0634   UROBILINOGEN 0.2 05/02/2015 1008   NITRITE NEGATIVE 04/07/2016 0634   LEUKOCYTESUR NEGATIVE 04/07/2016 0634   Sepsis Labs: '@LABRCNTIP'$ (procalcitonin:4,lacticidven:4) )No results found for this or any previous visit (from the past 240 hour(s)).   Scheduled Meds: . aspirin  325 mg Oral Daily  . carvedilol  6.25 mg Oral BID WC  . feeding supplement (ENSURE ENLIVE)  237 mL Oral BID PC  . ferrous gluconate  324 mg Oral Q2200  . fluticasone  2 spray Each Nare Daily  . guaiFENesin  1,200 mg Oral BID  . heparin  5,000 Units Subcutaneous Q8H  . levofloxacin  750 mg Oral Q48H  . multivitamin with minerals  1 tablet Oral Daily  . sodium chloride flush  3 mL Intravenous Q12H   Continuous Infusions:  Procedures/Studies: Dg Chest 2 View  Result Date: 04/06/2016 CLINICAL DATA:  Shortness of breath. Sore back radiating to both flanks. Discharge from [facility] on Saturday with diagnosis of pneumonia. Continued fever and chills. EXAM: CHEST  2 VIEW COMPARISON:  04/01/2016 FINDINGS: Shallow inspiration with linear atelectasis in the lung bases. Cardiac enlargement. Diffuse airspace opacities in the lungs consistent with edema or multifocal pneumonia. Similar appearance to previous study. No blunting of costophrenic angles. No pneumothorax. Calcified aorta. Thoracic scoliosis convex towards the right. IMPRESSION: Cardiac enlargement. Diffuse airspace disease suggesting edema or pneumonia. Electronically Signed   By: Lucienne Capers M.D.   On: 04/06/2016 23:55   Dg Chest 2 View  Result Date: 04/01/2016 CLINICAL DATA:  Acute onset of fever, cough and body aches. Initial encounter. EXAM: CHEST  2 VIEW COMPARISON:  Chest radiograph performed 12/29/2015 FINDINGS: The lungs are well-aerated. Vascular congestion is noted. Bilateral central airspace opacities may  reflect pneumonia or pulmonary edema. No pleural effusion or pneumothorax is seen. The heart is mildly enlarged. No acute osseous abnormalities are seen. IMPRESSION: Vascular congestion and mild cardiomegaly. Bilateral central airspace opacities may reflect pneumonia or pulmonary edema. Electronically Signed   By: Garald Balding M.D.   On: 04/01/2016 03:08   Ct Chest Wo Contrast  Result Date: 04/01/2016 CLINICAL DATA:  Chest pain. Evaluate for aortic aneurysm. History of myeloproliferative disorder. EXAM: CT CHEST WITHOUT CONTRAST TECHNIQUE: Multidetector CT imaging of the chest was performed following the standard protocol without IV contrast. COMPARISON:  06/21/2014 FINDINGS: Cardiovascular: Ascending thoracic aorta measures up to 4.4 cm and stable. Atherosclerotic calcifications involving the posterior aortic arch. Proximal descending thoracic aorta measures 3.8 cm and stable. Mid descending thoracic aorta measures 3.4 cm and stable. Main pulmonary artery is markedly enlarged measuring 5 cm but stable. Mediastinum/Nodes: Small mediastinal lymph nodes have not significantly changed. There is a 1 cm hypodense nodule or cyst in the right thyroid lobe. There is subcutaneous edema in the chest. No significant pericardial fluid. Lungs/Pleura: Trachea and mainstem bronchi are patent. Again noted is a mosaic attenuation pattern throughout the lungs. This pattern probably represents multiple areas of air trapping. These are prominent pulmonary vessels in the lung bases. Volume loss in the right lower lobe related to elevation of the right hemidiaphragm. There is some septal thickening in the upper lobes. No significant pleural effusions. Upper Abdomen: Again noted is an enlarged spleen which is only partially visualized. No acute abnormality in the upper abdomen. There is a high-density structure within the stomach lumen. Musculoskeletal: The visualized bones are diffusely sclerotic which is similar to the previous  examination. There are small scattered lucent areas throughout the bones. Some of these lucent areas/lesions have enlarged from the prior examination. Lucent lesion along the posterior left sixth rib now measures 1.1 cm and previously measured 0.7 cm. No evidence for pathologic fracture. 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 Recommend annual imaging followup by CTA or MRA. This recommendation follows 2010 ACCF/AHA/AATS/ACR/ASA/SCA/SCAI/SIR/STS/SVM Guidelines for the Diagnosis and Management of Patients with Thoracic Aortic Disease. Circulation. 2010; 121: X833-A250 Stable enlargement of pulmonary arteries compatible with pulmonary hypertension. Mosaic attenuation pattern throughout both lungs. Similar finding was present on the previous examination. This is probably related to a combination of air trapping and mild edema. Bones are diffusely sclerotic with enlarged lucent lesions. 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 Splenomegaly. This is probably related to the history of myeloproliferative disorder. Electronically Signed   By: Markus Daft M.D.   On: 04/01/2016 11:14   Dg Chest Port 1 View  Result Date: 04/10/2016 CLINICAL DATA:  Acute respiratory failure with hypoxia. Pulmonary edema versus other infectious or leukemic infiltrates. EXAM: PORTABLE CHEST 1 VIEW COMPARISON:  Chest x-rays dated 04/06/2016, 04/01/2016, 12/29/2015 and 01/21/2015. FINDINGS: No significant change compared to the most recent chest x-ray of 04/06/2016, with persistent bilateral perihilar and bibasilar opacities compatible with either pulmonary edema or multifocal pneumonia. No new lung findings. No pleural effusions seen. Heart size and mediastinal contours are stable. Atherosclerotic changes again noted at the aortic arch. Osseous structures appear mottled, particularly about each shoulder, compatible with the mixed sclerotic and lucent lesions described on earlier  chest CT of 04/01/2016. IMPRESSION: 1. Persistent perihilar and bibasilar airspace opacities, unchanged compared to the most recent chest x-ray of 04/06/2016, compatible with either pulmonary edema or multifocal pneumonia, favor pulmonary edema. No new lung findings. 2. Mottled appearance of the osseous structures, particularly prominent about each shoulder, compatible with the mixed sclerotic and lucent lesions described on chest CT of 04/01/2016, highly suspicious for metastatic disease. 3. Aortic atherosclerosis. Electronically Signed   By: Franki Cabot M.D.   On: 04/10/2016 19:06    Cataleyah Colborn, DO  [facility] Pager [phone]  If 7PM-7AM, please contact night-coverage [email or web address] Password [email or web address] 04/11/2016, 4:40 PM   LOS: 3 days

## 2016-04-12 DIAGNOSIS — I5023 Acute on chronic systolic (congestive) heart failure: Secondary | ICD-10-CM

## 2016-04-12 LAB — BASIC METABOLIC PANEL
Anion gap: 15 (ref 5–15)
BUN: 89 mg/dL — AB (ref 6–20)
CALCIUM: 9.6 mg/dL (ref 8.9–10.3)
CO2: 20 mmol/L — ABNORMAL LOW (ref 22–32)
CREATININE: 2.55 mg/dL — AB (ref 0.61–1.24)
Chloride: 100 mmol/L — ABNORMAL LOW (ref 101–111)
GFR calc Af Amer: 27 mL/min — ABNORMAL LOW (ref >60)
GFR, EST NON AFRICAN AMERICAN: 23 mL/min — AB (ref >60)
Glucose, Bld: 108 mg/dL — ABNORMAL HIGH (ref 65–99)
POTASSIUM: 5 mmol/L (ref 3.5–5.1)
SODIUM: 135 mmol/L (ref 135–145)

## 2016-04-12 LAB — PROCALCITONIN: PROCALCITONIN: 2.68 ng/mL

## 2016-04-12 LAB — PSA: PSA: 0.75 ng/mL (ref 0.00–4.00)

## 2016-04-12 MED ORDER — CARVEDILOL 6.25 MG PO TABS: 6.2500 mg | ORAL_TABLET | Freq: Two times a day (BID) | ORAL | 0 refills | Status: DC

## 2016-04-12 MED ORDER — HYDROCODONE-ACETAMINOPHEN 5-325 MG PO TABS: 1.0000 | ORAL_TABLET | Freq: Four times a day (QID) | ORAL | 0 refills | Status: DC | PRN

## 2016-04-12 MED ORDER — SODIUM POLYSTYRENE SULFONATE 15 GM/60ML PO SUSP
30.0000 g | Freq: Once | ORAL | Status: AC
  Administered 2016-04-12: 30 g via ORAL
  Filled 2016-04-12: qty 120

## 2016-04-12 NOTE — Progress Notes (Signed)
Patient sitting up in chair during entire shift (grandsons picked him up and placed him in chair around midnight last night per patient).  Does not want to get back in bed, states the bed was hurting him.  Rn did offer to get an air mattress but patient refused this. Continues to complain of discomfort constantly, again from head to bottoms of feet.  Having nurse rub Voltaren gel on legs and feet.  Patient continues to be very demanding, having staff do all movement of his body for him, feels that staff should be able to pick him up and move him without any assistance from him.  Discussed with patient that this is not possible or safe for staff or him.  Eating no food, refuses to let kitchen even bring in a tray to his room.  Drinking ice water and very little Ensure only.

## 2016-04-12 NOTE — Progress Notes (Signed)
Patient very uncomfortable during entire shift, complains of soreness of body from head to bottoms of feet.  Constantly having staff come in and reposition him or bed.  Refusing all meals now, will agree to ice water and some small amounts of Ensure.  Patient very demanding, does not even attempt to assist staff with any movement of extremities or body.

## 2016-04-12 NOTE — Progress Notes (Signed)
Patients feet and legs are noticeably more edematous since he has sat in chair all day.  Staff repeatedly asked patient to let us put his legs up because they were swelling but he refused.

## 2016-04-12 NOTE — Consult Note (Addendum)
Consultation Note Date: 04/12/2016   Patient Name: Juan [name]  DOB: Oct 07, 1940  MRN: [id number]  Age / Sex: 75 y.o., male  PCP: Colon Branch, MD Referring Physician: Orson Eva, MD  Reason for Consultation: Establishing goals of care  HPI/Patient Profile: 75 y.o. male  with past medical history of HTN, diastolic CHF, CAD, AAA, DM type II, myeloproliferative disorder, and hospitalization from 11/15 to 11/18 for community-acquired pneumonia (discharged with plan for Levaquin which he did not get due to not being able to afford medication) admitted on 04/06/2016 with heart failure, pulmonary infiltrates, ARF, thrombocytosis, and myeloproliferative disorder.  He was reevaluated by hematology 11/25. He has been declining bone marrow biopsy since initial discovery of disease. Plan for flow cytometry on 11/27. Palliative consulted for goals of care.  Clinical Assessment and Goals of Care: I met today with Juan [name].  Reports the most important things to him are his family and his faith.   He reports his doctors have been doing a good job explaining things to him, however, when I asked him to explain to me what his understanding of everything going on, he simply said "Whatever is God's will is gonna happen." He would not really discuss with me further.  Attempted to discuss with him that the goal of any medical care is to allow him to live as well as possible for as long as possible.  Also attempted to discuss with him concern that he continues to have decline in his nutrition, functional status, and cognition. He was not really willing to engage in this conversation either and stated again "God is in control of whatever happens."  We did discuss CODE STATUS and his wishes in the event that he continues to decompensate and dies. He is clear that his desire is to remain full code with full aggressive care.  I asked about having  family come for meeting and he states he does not really want his family involved in decisions. When I be the decision maker on his behalf if he reached a point where he could not make decisions on his own, he reported it would be his daughter, Juan [name].  He then stated that if I can arrange for Juan [name] to calm without any other family he would be willing to sit down and talk about his clinical course and pathways moving forward with her.  I called to discuss with his daughter and she reports that in the past he has never been willing to share information regarding his care. She states that she is more than willing to come in for meeting tomorrow, however, she would like for me to check with her father again tomorrow morning to see if he is still willing to have this conversation with her present as she thinks he is going to change his mind.   SUMMARY OF RECOMMENDATIONS   - I think Juan [name] has limited insight into the severity of his condition. He is not willing to engage in discussion regarding goals of care with me  at this time. He is adamant that God is in control and whatever is His will is what is going to happen. - Did agree to sit down with me tomorrow if his daughter can come for meeting. I called and spoke with his daughter who states that she would like me to confirm this with her father again in the morning as he has had similar things in the past but then changed his mind. She is available to come for meeting tomorrow if he is willing for [place] to meet to talk. - He remains full code  Code Status/Advance Care Planning:  Full code  Palliative Prophylaxis:   Aspiration, Delirium Protocol and Frequent Pain Assessment  Psycho-social/Spiritual:   Desire for further Chaplaincy support:yes; on-call notified, although it appears it will be Monday before they can respond to request  Additional Recommendations: Caregiving  Support/Resources  Prognosis:   Unable to determine.  He appears  critically ill and continues to decline. He is at high risk for continued decompensation and death at any point.  Discharge Planning: To Be Determined      Primary Diagnoses: Present on Admission: . Community acquired pneumonia . Thoracic aortic aneurysm (HCC) . Leukocytosis . Acute renal failure superimposed on chronic kidney disease (HCC) . Thrombocytosis (HCC) . Anemia . Systolic and diastolic CHF, acute on chronic (HCC) . Acute respiratory failure with hypoxia (HCC) . Myeloproliferative disease (HCC)   I have reviewed the medical record, interviewed the patient and family, and examined the patient. The following aspects are pertinent.  Past Medical History:  Diagnosis Date  . Allergic rhinitis   . Anemia   . Ascending aortic aneurysm (HCC) 03/2014   4.3cm on CT scan  . CAD (coronary artery disease)    dx elsewheer in past, no documentation. Non-ischemic myoview 2007  . Chronic diastolic CHF (congestive heart failure), NYHA class 2 (HCC)    Normal EF w/ grade 1 dd by echo 12/2015  . Edema    R>L leg, u/s 5-12 neg for DVT  . Hemorrhoid   . History of thrombocytosis   . Hypertension   . Migraine    "once/wk at least" (07/11/2013)  . Myeloproliferative disease (HCC)   . Shortness of breath   . Sinus congestion   . Sleep apnea, obstructive    at some point used CPAP, was d/c  years ago  . Type II diabetes mellitus (HCC)    Social History   Social History  . Marital status: Widowed    Spouse name: N/A  . Number of children: 2  . Years of education: N/A   Occupational History  . retired    .  Retired   Social History Main Topics  . Smoking status: Former Smoker    Packs/day: 0.25    Years: 12.00    Types: Cigarettes    Quit date: 05/18/1966  . Smokeless tobacco: Never Used     Comment: quit smoking 45 years ago  . Alcohol use No  . Drug use: No  . Sexual activity: Yes   Other Topics Concern  . None   Social History Narrative   Moved from [place] 2006    Widow 2007   Son lives with him    Family History  Problem Relation Age of Onset  . Schizophrenia Son   . Colon cancer Neg Hx   . Prostate cancer Neg Hx   . Heart attack Neg Hx   . Diabetes Neg Hx    Scheduled Meds: .  aspirin  325 mg Oral Daily  . carvedilol  6.25 mg Oral BID WC  . feeding supplement (ENSURE ENLIVE)  237 mL Oral BID PC  . ferrous gluconate  324 mg Oral Q2200  . fluticasone  2 spray Each Nare Daily  . guaiFENesin  1,200 mg Oral BID  . heparin  5,000 Units Subcutaneous Q8H  . multivitamin with minerals  1 tablet Oral Daily  . sodium chloride flush  3 mL Intravenous Q12H   Continuous Infusions: PRN Meds:.sodium chloride, acetaminophen, alum & mag hydroxide-simeth, calcium carbonate, diclofenac sodium, gabapentin, HYDROcodone-acetaminophen, ipratropium-albuterol, MUSCLE RUB, ondansetron (ZOFRAN) IV, simethicone, sodium chloride flush Medications Prior to Admission:  Prior to Admission medications   Medication Sig Start Date End Date Taking? Authorizing Provider  acetaminophen (TYLENOL) 500 MG tablet Take 500-1,000 mg by mouth every 6 (six) hours as needed for moderate pain or headache. Reported on 05/02/2015   Yes Historical Provider, MD  aspirin 325 MG tablet Take 1 tablet (325 mg total) by mouth daily. 04/16/14  Yes Ripudeep K Rai, MD  carvedilol (COREG) 6.25 MG tablet TAKE 1 TABLET BY MOUTH 2 TIMES DAILY. Patient taking differently: TAKE 12.5 MG BY MOUTH 2 TIMES DAILY. 03/10/16  Yes Shirley Friar, PA-C  feeding supplement, GLUCERNA SHAKE, (GLUCERNA SHAKE) LIQD Take 237 mLs by mouth as needed (protein). Reported on 08/21/2015   Yes Historical Provider, MD  ferrous gluconate (FERGON) 225 (27 Fe) MG tablet Take 240 mg by mouth daily at 10 pm.   Yes Historical Provider, MD  fluticasone (FLONASE) 50 MCG/ACT nasal spray Place 2 sprays into both nostrils daily. Reported on 05/02/2015   Yes Historical Provider, MD  gabapentin (NEURONTIN) 100 MG capsule Take 100 mg by  mouth 3 (three) times daily as needed (pain). Reported on 08/22/2015   Yes Historical Provider, MD  guaiFENesin (MUCINEX) 600 MG 12 hr tablet Take 2 tablets (1,200 mg total) by mouth 2 (two) times daily. 04/04/16  Yes Verlee Monte, MD  hydrALAZINE (APRESOLINE) 25 MG tablet TAKE 2 TABLETS BY MOUTH 3 TIMES DAILY. Patient taking differently: TAKE 50 MG BY MOUTH 3 TIMES DAILY. 12/03/15  Yes Amy D Clegg, NP  hydrocerin (EUCERIN) CREA Apply Eucerin cream to BLE Q day after bathing and roughly towel drying to remove loose skin 01/27/14  Yes Shanker Kristeen Mans, MD  HYDROcodone-acetaminophen (NORCO/VICODIN) 5-325 MG tablet Take 1 tablet by mouth every 6 (six) hours as needed for moderate pain. 04/04/16  Yes Verlee Monte, MD  isosorbide mononitrate (IMDUR) 30 MG 24 hr tablet TAKE 1 TABLET (30 MG TOTAL) BY MOUTH 2 (TWO) TIMES DAILY. Patient taking differently: Take 30 mg by mouth 2 (two) times daily as needed (for blood pressure).  03/10/16  Yes Amy D Clegg, NP  simethicone (MYLICON) 80 MG chewable tablet Chew 80 mg by mouth every 6 (six) hours as needed for flatulence.   Yes Historical Provider, MD  torsemide (DEMADEX) 20 MG tablet Take 4 tablets (80 mg total) by mouth daily. 03/14/15  Yes Jolaine Artist, MD  vitamin B-12 (CYANOCOBALAMIN) 100 MCG tablet Take 100 mcg by mouth daily.   Yes Historical Provider, MD   No Known Allergies Review of Systems  Constitutional: Positive for activity change, appetite change and fatigue.  Cardiovascular: Positive for chest pain.  Gastrointestinal: Positive for abdominal pain.  Musculoskeletal: Positive for back pain, myalgias, neck pain and neck stiffness.  Neurological: Positive for weakness.  Psychiatric/Behavioral: Positive for sleep disturbance.   Physical Exam  General: Alert, awake, in  no acute distress.  HEENT: No bruits, no goiter, no JVD Heart: Regular rate and rhythm. No murmur appreciated. Lungs: Good air movement, faint rales in bases Abdomen: Soft,  nontender, nondistended, positive bowel sounds.  Ext: 1+ edema Skin: Warm and dry Neuro: Grossly intact, nonfocal.   Vital Signs: BP 119/67 (BP Location: Right Arm)   Pulse (!) 109   Temp 97.7 F (36.5 C) (Oral)   Resp 18   Ht '6\' 3"'$  (1.905 m)   Wt 94.9 kg (209 lb 3.2 oz)   SpO2 96%   BMI 26.15 kg/m  Pain Assessment: 0-10   Pain Score: 7    SpO2: SpO2: 96 % O2 Device:SpO2: 96 % O2 Flow Rate: .O2 Flow Rate (L/min): 2 L/min  IO: Intake/output summary:  Intake/Output Summary (Last 24 hours) at 04/12/16 0911 Last data filed at 04/12/16 0600  Gross per 24 hour  Intake              720 ml  Output              700 ml  Net               20 ml    LBM: Last BM Date: 04/08/16 Baseline Weight: Weight: 93 kg (205 lb) Most recent weight: Weight: 94.9 kg (209 lb 3.2 oz)     Palliative Assessment/Data:     Time In: 1710 Time Out: 1755 Time Total: 45 Greater than 50%  of this time was spent counseling and coordinating care related to the above assessment and plan.  Signed by: Micheline Rough, MD   Please contact Palliative Medicine Team phone at [phone] for questions and concerns.  For individual provider: See Shea Evans

## 2016-04-12 NOTE — Progress Notes (Signed)
PROGRESS NOTE  Ojas Coone LTJ:[id number] DOB: 1940-09-25 DOA: 04/06/2016 PCP: [name], MD  Brief History: 75 year old African-American male with a past medical history of coronary artery disease, diabetes, diastolic and systolic CHF, CAD, presented with complaints of SOB, weakness, cough and pain all over. Patient was just hospitalized from 11/15 - 11/18 for community-acquired pneumonia and treated with Levaquin. Patient was discharged home on Levaquin, but notes that he is on a fixed income and couldn't afford to fillthe prescription. Ch366est x-ray at the time of admission showed basilar atelectasis with diffuse airspace opacities concerning for pulmonary edema. The patient was started on intravenous furosemide with good clinical results. Cardiology was consulted and ultimately felt patient was euvolemic.  Given the patient's overall medical condition, they recommended stopping diuretics altogether and using lasix prn only.  Palliative medicine was consulted but, patient not willing to engage in conversation regarding long-term goals  nor is he willing to have his family as part of any discussions regarding his care.   Assessment/Plan: Acute on chronic systolic and diastolic CHF -07/62/2633 echo EF 55-60%, grade 1 DD, PASP 33, mild RV dilatation -restart lasix 40 mg IV BID initially -Admission chest x-ray shows pulmonary vascular congestion and pulmonary edema -daily weights--dry weight ~194 -04/10/16 weight 198 -appreciate cardiology consult-->furosemide, hydralazine, imdur stopped 11/24 -d/c hydralazine and imdur due to soft BPs  Pulmonary infiltrates -question if his clinical syndrome is consistent with pneumonia -suspect this is possible leukemic infiltrates -The patient was discharged on 04/04/2016 with instructionsto finish 5 more days levoflox -d/c levoflox after 11/25dose -procalcitonin 0.43-->3.16-->2.68 -04/10/16 repeat CXR--stable infiltrates  SVT -pt having  1-2 seconds of SVT, asymptomatic--2 episodes on 11/26 -may increase BB if BP is able to tolerate if episodes more frequent or longer in duration  Acute on chronic renal failure--CKD 3 -Baseline creatinine 1.4-1.8 -Monitor with diuresis -serum creatinine peak 2.55  Myelodysplastic syndrome/Myelofibrosis -saw Hem[facility] at [facility] July 2017--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 He had 3% blasts in the peripheral blood, which was confirmed by flow cytometry -recommended Jakafi at that time, but pt refused BM bx and was lost to follow up at [facility] -04/07/16 diff with increase WBC precursors -consulted hematology-- Dr. Melissa Montane flow cytometry on 11/27 -iron sat 4 %, ferritin 417, B12--1372, folate 56.7 -He is afebrile and hemodynamically stable  Thrombocytosis -There is a component of myelofibrosis  Diabetes mellitus type 2, controlled -12/26/2015 and hemoglobin A1c 6.1 -Not on any oral agents outpt  Hyperkalemia -kayexalate x 1  GOC -remains full code--discussed with patient -04/12/16--he allowed me to speak with his daughter--she was updated and will try to speak with patient-04/10/16--consulted palliative medicine--pt not willing to engage in conversation regarding long-term goals  nor is he willing to have his family as part of any discussions regarding his care. -very poor insight into severity of his medical conditions   Disposition Plan: SNF11/27/17  Family Communication: Patient allowed me to speak with daughter on 11/26--updated--Total time spent 35 minutes.  Greater than 50% spent face to face counseling and coordinating care.   Consultants: none  Code Status: FULL   DVT Prophylaxis: [place] Heparin    Procedures: As Listed in Progress Note Above  Antibiotics: Levofloxacin 11/20>>>11/25   Subjective:  Patient denies fevers, chills, headache, chest pain,  nausea, vomiting, diarrhea, abdominal pain, dysuria,  hematuria, hematochezia, and melena.  "pain all over"  Dyspneic with exertion   Objective: Vitals:   04/11/16 0751 04/11/16 1200 04/11/16 2014  04/12/16 0454  BP: 116/60 (!) 112/59 110/60 119/67  Pulse: 100 91 91 (!) 109  Resp: '20 18 20 18  '$ Temp: 97.8 F (36.6 C) 97.7 F (36.5 C) 98 F (36.7 C) 97.7 F (36.5 C)  TempSrc: Oral Oral Oral Oral  SpO2: 97% 95% 94% 96%  Weight: 94.9 kg (209 lb 3.2 oz)     Height:        Intake/Output Summary (Last 24 hours) at 04/12/16 1658 Last data filed at 04/12/16 1300  Gross per 24 hour  Intake              720 ml  Output              100 ml  Net              620 ml   Weight change:  Exam:   General:  Pt is alert, follows commands appropriately, not in acute distress  HEENT: No icterus, No thrush, No neck mass, [place]/AT  Cardiovascular: RRR, S1/S2, no rubs, no gallops  Respiratory: CTA bilaterally, no wheezing, no crackles, no rhonchi  Abdomen: Soft/+BS, non tender, non distended, no guarding  Extremities: 2 + LE edema, No lymphangitis, No petechiae, No rashes, no synovitis   Data Reviewed: I have personally reviewed following labs and imaging studies Basic Metabolic Panel:  Recent Labs Lab 04/08/16 0420 04/09/16 0458 04/10/16 0528 04/11/16 0336 04/12/16 0541  NA 135 136 134* 134* 135  K 4.0 4.2 4.8 5.4* 5.0  CL 103 106 102 103 100*  CO2 22 22 21* 19* 20*  GLUCOSE 105* 109* 123* 115* 108*  BUN 59* 58* 69* 80* 89*  CREATININE 2.02* 1.94* 2.34* 2.34* 2.55*  CALCIUM 9.3 9.4 9.3 9.4 9.6   Liver Function Tests:  Recent Labs Lab 04/07/16 0010  AST 31  ALT 12*  ALKPHOS 76  BILITOT 0.3  PROT 6.3*  ALBUMIN 3.0*   No results for input(s): LIPASE, AMYLASE in the last 168 hours. No results for input(s): AMMONIA in the last 168 hours. Coagulation Profile: No results for input(s): INR, PROTIME in the last 168 hours. CBC:  Recent Labs Lab 04/07/16 0010 04/07/16 0532 04/08/16 0420 04/10/16 0528 04/11/16 0336  WBC  13.2* 10.6* 10.5 14.3* 15.0*  NEUTROABS 6.9 6.8  --  9.2* 9.5*  HGB 9.1* 8.5* 8.1* 8.7* 8.9*  HCT 28.2* 26.8* 25.5* 26.5* 27.2*  MCV 78.3 77.5* 78.0 77.7* 76.8*  PLT 578* 527* 484* 575* 498*   Cardiac Enzymes:  Recent Labs Lab 04/07/16 0010 04/07/16 0532 04/07/16 1148 04/07/16 1821  TROPONINI 0.03* <0.03 0.10* <0.03   BNP: Invalid input(s): POCBNP CBG: No results for input(s): GLUCAP in the last 168 hours. HbA1C: No results for input(s): HGBA1C in the last 72 hours. Urine analysis:    Component Value Date/Time   COLORURINE YELLOW 04/07/2016 [place] 04/07/2016 0634   LABSPEC 1.012 04/07/2016 0634   PHURINE 5.0 04/07/2016 0634   GLUCOSEU NEGATIVE 04/07/2016 [place] 05/02/2015 1008   HGBUR NEGATIVE 04/07/2016 [place] 04/07/2016 0634   KETONESUR NEGATIVE 04/07/2016 0634   PROTEINUR 100 (A) 04/07/2016 0634   UROBILINOGEN 0.2 05/02/2015 1008   NITRITE NEGATIVE 04/07/2016 0634   LEUKOCYTESUR NEGATIVE 04/07/2016 0634   Sepsis Labs: '@LABRCNTIP'$ (procalcitonin:4,lacticidven:4) )No results found for this or any previous visit (from the past 240 hour(s)).   Scheduled Meds: . aspirin  325 mg Oral Daily  . carvedilol  6.25 mg Oral BID WC  .  feeding supplement (ENSURE ENLIVE)  237 mL Oral BID PC  . ferrous gluconate  324 mg Oral Q2200  . fluticasone  2 spray Each Nare Daily  . guaiFENesin  1,200 mg Oral BID  . heparin  5,000 Units Subcutaneous Q8H  . multivitamin with minerals  1 tablet Oral Daily  . sodium chloride flush  3 mL Intravenous Q12H   Continuous Infusions:  Procedures/Studies: Dg Chest 2 View  Result Date: 04/06/2016 CLINICAL DATA:  Shortness of breath. Sore back radiating to both flanks. Discharge from [facility] on Saturday with diagnosis of pneumonia. Continued fever and chills. EXAM: CHEST  2 VIEW COMPARISON:  04/01/2016 FINDINGS: Shallow inspiration with linear atelectasis in the lung bases.  Cardiac enlargement. Diffuse airspace opacities in the lungs consistent with edema or multifocal pneumonia. Similar appearance to previous study. No blunting of costophrenic angles. No pneumothorax. Calcified aorta. Thoracic scoliosis convex towards the right. IMPRESSION: Cardiac enlargement. Diffuse airspace disease suggesting edema or pneumonia. Electronically Signed   By: Lucienne Capers M.D.   On: 04/06/2016 23:55   Dg Chest 2 View  Result Date: 04/01/2016 CLINICAL DATA:  Acute onset of fever, cough and body aches. Initial encounter. EXAM: CHEST  2 VIEW COMPARISON:  Chest radiograph performed 12/29/2015 FINDINGS: The lungs are well-aerated. Vascular congestion is noted. Bilateral central airspace opacities may reflect pneumonia or pulmonary edema. No pleural effusion or pneumothorax is seen. The heart is mildly enlarged. No acute osseous abnormalities are seen. IMPRESSION: Vascular congestion and mild cardiomegaly. Bilateral central airspace opacities may reflect pneumonia or pulmonary edema. Electronically Signed   By: Garald Balding M.D.   On: 04/01/2016 03:08   Ct Chest Wo Contrast  Result Date: 04/01/2016 CLINICAL DATA:  Chest pain. Evaluate for aortic aneurysm. History of myeloproliferative disorder. EXAM: CT CHEST WITHOUT CONTRAST TECHNIQUE: Multidetector CT imaging of the chest was performed following the standard protocol without IV contrast. COMPARISON:  06/21/2014 FINDINGS: Cardiovascular: Ascending thoracic aorta measures up to 4.4 cm and stable. Atherosclerotic calcifications involving the posterior aortic arch. Proximal descending thoracic aorta measures 3.8 cm and stable. Mid descending thoracic aorta measures 3.4 cm and stable. Main pulmonary artery is markedly enlarged measuring 5 cm but stable. Mediastinum/Nodes: Small mediastinal lymph nodes have not significantly changed. There is a 1 cm hypodense nodule or cyst in the right thyroid lobe. There is subcutaneous edema in the chest. No  significant pericardial fluid. Lungs/Pleura: Trachea and mainstem bronchi are patent. Again noted is a mosaic attenuation pattern throughout the lungs. This pattern probably represents multiple areas of air trapping. These are prominent pulmonary vessels in the lung bases. Volume loss in the right lower lobe related to elevation of the right hemidiaphragm. There is some septal thickening in the upper lobes. No significant pleural effusions. Upper Abdomen: Again noted is an enlarged spleen which is only partially visualized. No acute abnormality in the upper abdomen. There is a high-density structure within the stomach lumen. Musculoskeletal: The visualized bones are diffusely sclerotic which is similar to the previous examination. There are small scattered lucent areas throughout the bones. Some of these lucent areas/lesions have enlarged from the prior examination. Lucent lesion along the posterior left sixth rib now measures 1.1 cm and previously measured 0.7 cm. No evidence for pathologic fracture. 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 Recommend annual imaging followup by CTA or MRA. This recommendation follows 2010 ACCF/AHA/AATS/ACR/ASA/SCA/SCAI/SIR/STS/SVM Guidelines for the Diagnosis and Management of Patients with Thoracic Aortic Disease. Circulation. 2010;  121: [id number] Stable enlargement of pulmonary arteries compatible with pulmonary hypertension. Mosaic attenuation pattern throughout both lungs. Similar finding was present on the previous examination. This is probably related to a combination of air trapping and mild edema. Bones are diffusely sclerotic with enlarged lucent lesions. 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 Splenomegaly. This is probably related to the history of myeloproliferative disorder. Electronically Signed   By: Markus Daft M.D.   On: 04/01/2016 11:14   Dg Chest Port 1 View  Result Date: 04/10/2016 CLINICAL DATA:   Acute respiratory failure with hypoxia. Pulmonary edema versus other infectious or leukemic infiltrates. EXAM: PORTABLE CHEST 1 VIEW COMPARISON:  Chest x-rays dated 04/06/2016, 04/01/2016, 12/29/2015 and 01/21/2015. FINDINGS: No significant change compared to the most recent chest x-ray of 04/06/2016, with persistent bilateral perihilar and bibasilar opacities compatible with either pulmonary edema or multifocal pneumonia. No new lung findings. No pleural effusions seen. Heart size and mediastinal contours are stable. Atherosclerotic changes again noted at the aortic arch. Osseous structures appear mottled, particularly about each shoulder, compatible with the mixed sclerotic and lucent lesions described on earlier chest CT of 04/01/2016. IMPRESSION: 1. Persistent perihilar and bibasilar airspace opacities, unchanged compared to the most recent chest x-ray of 04/06/2016, compatible with either pulmonary edema or multifocal pneumonia, favor pulmonary edema. No new lung findings. 2. Mottled appearance of the osseous structures, particularly prominent about each shoulder, compatible with the mixed sclerotic and lucent lesions described on chest CT of 04/01/2016, highly suspicious for metastatic disease. 3. Aortic atherosclerosis. Electronically Signed   By: Franki Cabot M.D.   On: 04/10/2016 19:06    Callen Zuba, DO  [facility] Pager [phone]  If 7PM-7AM, please contact night-coverage [email or web address] Password [email or web address] 04/12/2016, 4:58 PM   LOS: 4 days

## 2016-04-12 NOTE — Progress Notes (Signed)
Daily Progress Note   Patient Name: Juan [name]       Date: 04/12/2016 DOB: 04-15-1941  Age: 75 y.o. MRN#: [id number] Attending Physician: Orson Eva, MD Primary Care Physician: [name], MD Admit Date: 04/06/2016  Reason for Consultation/Follow-up: Establishing goals of care  Subjective: Met this morning with Juan [name]. We discussed plan from yesterday to have his daughter come in for a family meeting today. He reports that he is no longer willing to consider this and does not want his family notified about his current medical condition. He did report again today than in the event he cannot make his own decisions, his daughter Juan [name] is the most appropriate person to act as surrogate on his behalf.    We discussed again the goal of medical treatment to be to add time and quality to his life.  We discussed plan for flow cytometry as well as prior decision not to have bone marrow biopsy performed.  I attempted to explore with him decision not to have bone marrow biopsy but to have other aggressive care.  I also expressed to him my concern that if he continues to decompensate, aggressive measures would not likely result him ever being well enough to spend meaningful time with his family nor would he likely ever recover enough to leave the hospital.  He expressed understanding of this and stated again that he feels that whenever is God's will is what is going to happen to him. Desires to remain full code.  Length of Stay: 4  Current Medications: Scheduled Meds:  . aspirin  325 mg Oral Daily  . carvedilol  6.25 mg Oral BID WC  . feeding supplement (ENSURE ENLIVE)  237 mL Oral BID PC  . ferrous gluconate  324 mg Oral Q2200  . fluticasone  2 spray Each Nare Daily  . guaiFENesin  1,200 mg Oral BID  .  heparin  5,000 Units Subcutaneous Q8H  . multivitamin with minerals  1 tablet Oral Daily  . sodium chloride flush  3 mL Intravenous Q12H    Continuous Infusions:   PRN Meds: sodium chloride, acetaminophen, alum & mag hydroxide-simeth, calcium carbonate, diclofenac sodium, gabapentin, HYDROcodone-acetaminophen, ipratropium-albuterol, MUSCLE RUB, ondansetron (ZOFRAN) IV, simethicone, sodium chloride flush  Physical Exam   General: Alert, awake.  Sitting in chair, chronically ill-appearing.  HEENT:  No bruits, no goiter, no JVD Heart: Regular rate and rhythm. No murmur appreciated. Lungs: Good air movement, faint rales in bases Abdomen: Soft, nontender, nondistended, positive bowel sounds.  Ext: 1+ edema Skin: Warm and dry Neuro: Grossly intact, nonfocal.        Vital Signs: BP 119/67 (BP Location: Right Arm)   Pulse (!) 109   Temp 97.7 F (36.5 C) (Oral)   Resp 18   Ht 6\' 3"  (1.905 m)   Wt 94.9 kg (209 lb 3.2 oz)   SpO2 96%   BMI 26.15 kg/m  SpO2: SpO2: 96 % O2 Device: O2 Device: Nasal Cannula O2 Flow Rate: O2 Flow Rate (L/min): 2 L/min  Intake/output summary:  Intake/Output Summary (Last 24 hours) at 04/12/16 1158 Last data filed at 04/12/16 0900  Gross per 24 hour  Intake              720 ml  Output              800 ml  Net              -80 ml   LBM: Last BM Date: 04/08/16 Baseline Weight: Weight: 93 kg (205 lb) Most recent weight: Weight: 94.9 kg (209 lb 3.2 oz)       Palliative Assessment/Data:      Patient Active Problem List   Diagnosis Date Noted  . Systolic and diastolic CHF, acute on chronic (HCC) 04/08/2016  . Acute respiratory failure with hypoxia (HCC) 04/08/2016  . CHF exacerbation (HCC) 04/07/2016  . Acute renal failure superimposed on chronic kidney disease (HCC) 04/07/2016  . Thrombocytosis (HCC) 04/07/2016  . Anemia 04/07/2016  . Community acquired pneumonia 04/01/2016  . Pneumonia 04/01/2016  . Pressure injury of skin 04/01/2016  .  Pressure ulcer stage II 12/31/2015  . UTI (urinary tract infection) 12/30/2015  . Sepsis (HCC) 12/29/2015  . UTI (lower urinary tract infection) 12/29/2015  . T wave inversion in EKG 12/29/2015  . Goals of care, counseling/discussion 05/02/2015  . End-stage systolic heart failure, acute on chronic (HCC) 02/14/2015  . CKD (chronic kidney disease), stage III 01/22/2015  . Pain in limb   . Peripheral edema   . Unable to ambulate   . Thoracic aortic aneurysm (HCC) 04/26/2014  . CKD (chronic kidney disease) stage 3, GFR 30-59 ml/min 04/26/2014  . 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 Chest pain 04/15/2014  . Palliative care encounter 04/06/2014  . Weakness generalized 04/06/2014  . Venous stasis dermatitis of both lower extremities   . Malnutrition of moderate degree (HCC) 03/31/2014  . Morbid obesity due to excess calories (HCC) 03/29/2014  . 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 Leukocytosis 10/17/2013  . Poor compliance with advise  05/05/2012  . Annual physical exam 01/14/2012  . Weight loss 01/14/2012  . BPH (benign prostatic hyperplasia) 01/14/2012  . *Chronic lower extremity edema, stasis dermatitis 06/09/2011  . CARDIOMEGALY 04/29/2009  . *Prediabetes 09/07/2006  . Symptomatic anemia 09/07/2006  . Myeloproliferative disease (HCC) 09/07/2006  . Obstructive sleep apnea 09/07/2006  . Essential hypertension 09/07/2006  . Coronary atherosclerosis 09/07/2006  . *CHF-- PCP comments 09/07/2006  . ALLERGIC RHINITIS 09/07/2006    Palliative Care Assessment & Plan   Patient Profile: 75 y.o. male  with past medical history of HTN, diastolic CHF, CAD, AAA, DM type II, myeloproliferative disorder, and hospitalization from 11/15 to 11/18 for community-acquired pneumonia (discharged with plan for Levaquin which he did not get due to not being able  to afford medication) admitted on 04/06/2016 with heart failure, pulmonary infiltrates, ARF, thrombocytosis, and  myeloproliferative disorder.  He was reevaluated by hematology 11/25. He has been declining bone marrow biopsy since initial discovery of disease. Plan for flow cytometry on 11/27. Palliative consulted for goals of care.  Recommendations/Plan:  Continue current care  He is not willing to engage in conversation regarding long-term goals (other than to state that whenever is God's will is going to happen), nor is he willing to have his family as part of any discussions regarding his care.  He would likely benefit from chaplain follow-up.  I will call again tomorrow in order to see about visiting with chaplain and possibility of meeting with him in conjunction with chaplain.  He wants to talk again with oncology tomorrow. He may benefit from a joint meeting, and I would be happy to participate in any conversations.  I'm really not sure how to best help Mr. Yom as he does not fully engage in conversation with me and refuses to have his family as part of conversation.  I will continue to follow up daily to progress conversation as he is emotionally willing and able. Please let me know if there are other areas in which I can be of assistance in his care at this time.  Goals of Care and Additional Recommendations:  Limitations on Scope of Treatment: No bone marrow biopsy. Desires other aggressive measures in the event of decompensation.  Code Status:    Code Status Orders        Start     Ordered   04/07/16 0307  Full code  Continuous     04/07/16 0308    Code Status History    Date Active Date Inactive Code Status Order ID Comments User Context   04/01/2016  7:47 AM 04/04/2016  4:03 PM Full Code [id number]  Rise Patience, MD Inpatient   12/29/2015  3:35 PM 01/04/2016  5:34 AM Full Code [id number]  Willia Craze, NP ED   01/21/2015 12:02 PM 01/29/2015  4:18 PM Full Code [id number]  Melton Alar, PA-C Inpatient   06/17/2014  2:59 AM 06/25/2014  5:25 PM Full Code [id number]  Theressa Millard, MD Inpatient   04/15/2014  3:34 AM 04/16/2014  5:09 PM Full Code [id number]  Theressa Millard, MD Inpatient   03/29/2014  9:36 PM 04/10/2014  8:27 PM Full Code [id number]  Geradine Girt, DO Inpatient   01/25/2014  3:45 PM 01/27/2014  2:59 PM Full Code [id number]  Cristal Ford, DO Inpatient   07/11/2013  5:58 PM 07/14/2013  5:59 PM Full Code [id number]  Annita Brod, MD Inpatient       Prognosis:   Unable to determine  He appears critically ill and continues to decline. He is at high risk for continued decompensation and death at any point.  Discharge Planning:  To Be Determined  Care plan was discussed with patient  Thank you for allowing the Palliative Medicine Team to assist in the care of this patient.   Time In: 1045 Time Out: 1120 Total Time 35 Prolonged Time Billed No      Greater than 50%  of this time was spent counseling and coordinating care related to the above assessment and plan.  Micheline Rough, MD  Please contact Palliative Medicine Team phone at [phone] for questions and concerns.

## 2016-04-13 ENCOUNTER — Inpatient Hospital Stay (HOSPITAL_COMMUNITY): Payer: Medicare Other

## 2016-04-13 LAB — BASIC METABOLIC PANEL
Anion gap: 15 (ref 5–15)
BUN: 104 mg/dL — AB (ref 4–21)
BUN: 104 mg/dL — AB (ref 6–20)
CO2: 21 mmol/L — ABNORMAL LOW (ref 22–32)
CREATININE: 3.1 mg/dL — AB (ref 0.61–1.24)
Calcium: 9.2 mg/dL (ref 8.9–10.3)
Chloride: 98 mmol/L — ABNORMAL LOW (ref 101–111)
Creatinine: 3.1 mg/dL — AB (ref 0.6–1.3)
GFR calc Af Amer: 21 mL/min — ABNORMAL LOW (ref >60)
GFR calc non Af Amer: 18 mL/min — ABNORMAL LOW (ref >60)
GLUCOSE: 108 mg/dL
GLUCOSE: 108 mg/dL — AB (ref 65–99)
POTASSIUM: 4.1 mmol/L (ref 3.5–5.1)
Potassium: 4.1 mmol/L (ref 3.4–5.3)
SODIUM: 134 mmol/L — AB (ref 137–147)
Sodium: 134 mmol/L — ABNORMAL LOW (ref 135–145)

## 2016-04-13 LAB — CBC
HCT: 26.2 % — ABNORMAL LOW (ref 39.0–52.0)
HEMOGLOBIN: 8.4 g/dL — AB (ref 13.0–17.0)
MCH: 24.7 pg — ABNORMAL LOW (ref 26.0–34.0)
MCHC: 32.1 g/dL (ref 30.0–36.0)
MCV: 77.1 fL — AB (ref 78.0–100.0)
PLATELETS: 571 10*3/uL — AB (ref 150–400)
RBC: 3.4 MIL/uL — AB (ref 4.22–5.81)
RDW: 19.3 % — ABNORMAL HIGH (ref 11.5–15.5)
WBC: 18.7 10*3/uL — AB (ref 4.0–10.5)

## 2016-04-13 LAB — URINE MICROSCOPIC-ADD ON
RBC / HPF: NONE SEEN RBC/hpf (ref 0–5)
Squamous Epithelial / LPF: NONE SEEN

## 2016-04-13 LAB — URINALYSIS, ROUTINE W REFLEX MICROSCOPIC
Bilirubin Urine: NEGATIVE
GLUCOSE, UA: NEGATIVE mg/dL
HGB URINE DIPSTICK: NEGATIVE
Ketones, ur: NEGATIVE mg/dL
Nitrite: NEGATIVE
PH: 5 (ref 5.0–8.0)
PROTEIN: 30 mg/dL — AB
SPECIFIC GRAVITY, URINE: 1.016 (ref 1.005–1.030)

## 2016-04-13 LAB — CBC AND DIFFERENTIAL
HEMATOCRIT: 26 % — AB (ref 41–53)
Hemoglobin: 8.4 g/dL — AB (ref 13.5–17.5)
PLATELETS: 571 10*3/uL — AB (ref 150–399)
WBC: 18.7 10*3/mL

## 2016-04-13 LAB — CREATININE, URINE, RANDOM: Creatinine, Urine: 108.12 mg/dL

## 2016-04-13 LAB — URIC ACID: Uric Acid, Serum: 18.9 mg/dL — ABNORMAL HIGH (ref 4.4–7.6)

## 2016-04-13 LAB — SODIUM, URINE, RANDOM: Sodium, Ur: 16 mmol/L

## 2016-04-13 MED ORDER — SODIUM BICARBONATE 650 MG PO TABS
1300.0000 mg | ORAL_TABLET | Freq: Two times a day (BID) | ORAL | Status: DC
  Administered 2016-04-13 – 2016-04-15 (×4): 1300 mg via ORAL
  Filled 2016-04-13 (×4): qty 2

## 2016-04-13 MED ORDER — CARVEDILOL 3.125 MG PO TABS
3.1250 mg | ORAL_TABLET | Freq: Two times a day (BID) | ORAL | Status: DC
  Administered 2016-04-13 – 2016-04-18 (×10): 3.125 mg via ORAL
  Filled 2016-04-13 (×10): qty 1

## 2016-04-13 MED ORDER — CLOTRIMAZOLE 10 MG MT TROC
10.0000 mg | Freq: Every day | OROMUCOSAL | Status: DC
  Administered 2016-04-13 – 2016-04-18 (×25): 10 mg via ORAL
  Filled 2016-04-13 (×27): qty 1

## 2016-04-13 NOTE — Progress Notes (Signed)
   04/13/16 1600  Clinical Encounter Type  Visited With Patient  Visit Type Follow-up  Spiritual Encounters  Spiritual Needs Prayer;Emotional   - Per request, prayed for healing.   - Pt. Scared/uncertain regarding medical condition.  Will follow.  - Rev. [place]. ThM

## 2016-04-13 NOTE — Progress Notes (Signed)
Daily Progress Note   Patient Name: Juan [name]       Date: 04/13/2016 DOB: 1940/05/29  Age: 75 y.o. MRN#: [id number] Attending Physician: Orson Eva, MD Primary Care Physician: [name], MD Admit Date: 04/06/2016  Reason for Consultation/Follow-up: Establishing goals of care  Subjective: Met this morning with Juan [name].   Reviewed plan for flow cytometry as well as prior decision not to have bone marrow biopsy performed.     He reports he needs to know results from flow cytometry prior to making further decisions regarding his care.  I attempted again to explore his reasoning for having declined treatments in the past and now desiring some testing (flow cytometry) and declining others (BM biopsy).  He responded that he is "Juan [name] with how things are" in regard to plan moving forward.  He did not desire to discuss with me further.       Length of Stay: 5  Current Medications: Scheduled Meds:  . aspirin  325 mg Oral Daily  . carvedilol  6.25 mg Oral BID WC  . feeding supplement (ENSURE ENLIVE)  237 mL Oral BID PC  . ferrous gluconate  324 mg Oral Q2200  . fluticasone  2 spray Each Nare Daily  . guaiFENesin  1,200 mg Oral BID  . heparin  5,000 Units Subcutaneous Q8H  . multivitamin with minerals  1 tablet Oral Daily  . sodium chloride flush  3 mL Intravenous Q12H    Continuous Infusions:   PRN Meds: sodium chloride, acetaminophen, alum & mag hydroxide-simeth, calcium carbonate, diclofenac sodium, gabapentin, HYDROcodone-acetaminophen, ipratropium-albuterol, MUSCLE RUB, ondansetron (ZOFRAN) IV, simethicone, sodium chloride flush  Physical Exam   General: Alert, awake.  Sitting in bed, chronically ill-appearing.  HEENT: No bruits, no goiter, no JVD Heart: Regular rate and rhythm. No  murmur appreciated. Lungs: Good air movement, faint rales in bases Abdomen: Soft, nontender, nondistended, positive bowel sounds.  Ext: 1+ edema Skin: Warm and dry Neuro: Grossly intact, nonfocal.        Vital Signs: BP (!) 118/58 (BP Location: Left Arm)   Pulse 89   Temp 97.5 F (36.4 C) (Oral)   Resp 18   Ht '6\' 3"'$  (1.905 m)   Wt 90.2 kg (198 lb 12.8 oz)   SpO2 91%   BMI 24.85 kg/m  SpO2: SpO2: 91 %  O2 Device: O2 Device: Nasal Cannula O2 Flow Rate: O2 Flow Rate (L/min): 2 L/min  Intake/output summary:   Intake/Output Summary (Last 24 hours) at 04/13/16 1015 Last data filed at 04/13/16 0940  Gross per 24 hour  Intake              603 ml  Output              300 ml  Net              303 ml   LBM: Last BM Date: 04/12/16 Baseline Weight: Weight: 93 kg (205 lb) Most recent weight: Weight: 90.2 kg (198 lb 12.8 oz)       Palliative Assessment/Data:      Patient Active Problem List   Diagnosis Date Noted  . Systolic and diastolic CHF, acute on chronic ([place]) 04/08/2016  . Acute respiratory failure with hypoxia ([place]) 04/08/2016  . CHF exacerbation ([place]) 04/07/2016  . Acute renal failure superimposed on chronic kidney disease ([place]) 04/07/2016  . Thrombocytosis ([place]) 04/07/2016  . Anemia 04/07/2016  . Community acquired pneumonia 04/01/2016  . Pneumonia 04/01/2016  . Pressure injury of skin 04/01/2016  . Pressure ulcer stage II 12/31/2015  . UTI (urinary tract infection) 12/30/2015  . Sepsis ([place]) 12/29/2015  . UTI (lower urinary tract infection) 12/29/2015  . T wave inversion in EKG 12/29/2015  . Goals of care, counseling/discussion 05/02/2015  . End-stage systolic heart failure, acute on chronic ([place]) 02/14/2015  . CKD (chronic kidney disease), stage III 01/22/2015  . Pain in limb   . Peripheral edema   . Unable to ambulate   . Thoracic aortic aneurysm ([place]) 04/26/2014  . CKD (chronic kidney disease) stage 3, GFR 30-59 ml/min 04/26/2014  . *Neuropathy, on  neurontin, occ hydrocodone 04/26/2014  . Chest pain 04/15/2014  . Palliative care encounter 04/06/2014  . Weakness generalized 04/06/2014  . Venous stasis dermatitis of both lower extremities   . Malnutrition of moderate degree ([place]) 03/31/2014  . Morbid obesity due to excess calories ([place]) 03/29/2014  . Agnogenic myeloid metaplasia ([place]) 11/23/2013  . Leukocytosis 10/17/2013  . Poor compliance with advise  05/05/2012  . Annual physical exam 01/14/2012  . Weight loss 01/14/2012  . BPH (benign prostatic hyperplasia) 01/14/2012  . *Chronic lower extremity edema, stasis dermatitis 06/09/2011  . CARDIOMEGALY 04/29/2009  . *Prediabetes 09/07/2006  . Symptomatic anemia 09/07/2006  . Myeloproliferative disease ([place]) 09/07/2006  . Obstructive sleep apnea 09/07/2006  . Essential hypertension 09/07/2006  . Coronary atherosclerosis 09/07/2006  . *CHF-- PCP comments 09/07/2006  . ALLERGIC RHINITIS 09/07/2006    Palliative Care Assessment & Plan   Patient Profile: 75 y.o. male  with past medical history of HTN, diastolic CHF, CAD, AAA, DM type II, myeloproliferative disorder, and hospitalization from 11/15 to 11/18 for community-acquired pneumonia (discharged with plan for Levaquin which he did not get due to not being able to afford medication) admitted on 04/06/2016 with heart failure, pulmonary infiltrates, ARF, thrombocytosis, and myeloproliferative disorder.  He was reevaluated by hematology 11/25. He has been declining bone marrow biopsy since initial discovery of disease. Plan for flow cytometry on 11/27. Palliative consulted for goals of care.  Recommendations/Plan:  Continue current care  Remains resistant to engage in conversation regarding long-term goals (other than to state that whenever is God's will is going to happen). Did allow Dr. Carles Collet to speak with family yesterday.   He would benefit from chaplain follow-up.  I placed a referral over the weekend and  on-call said they will  notify floor chaplain today.  He wants to talk again with oncology following results of flow cytometry.  He states today he is not going to make any further decisions until results are back and he meets with oncology.  Will plan to follow up after this if he is still inpatient.  If he discharges, he would be well served by palliative follow-up as OP.  Our team would also be happy to meet in conjunction with oncology if this would be helpful.  I'm really not sure how to best help Mr. [place] as he does not fully engage in conversation.  If he remains admitted, will ask member of PMT to follow up later this week once results of flow cytometry are available.     Please let out team know if there are other areas in which we can be of assistance in his care at this time.  Goals of Care and Additional Recommendations:  Limitations on Scope of Treatment: No bone marrow biopsy. Desires other aggressive measures in the event of decompensation.  Code Status:    Code Status Orders        Start     Ordered   04/07/16 0307  Full code  Continuous     04/07/16 0308    Code Status History    Date Active Date Inactive Code Status Order ID Comments User Context   04/01/2016  7:47 AM 04/04/2016  4:03 PM Full Code [id number]  Rise Patience, MD Inpatient   12/29/2015  3:35 PM 01/04/2016  5:34 AM Full Code [id number]  Willia Craze, NP ED   01/21/2015 12:02 PM 01/29/2015  4:18 PM Full Code [id number]  Melton Alar, PA-C Inpatient   06/17/2014  2:59 AM 06/25/2014  5:25 PM Full Code [id number]  Theressa Millard, MD Inpatient   04/15/2014  3:34 AM 04/16/2014  5:09 PM Full Code [id number]  Theressa Millard, MD Inpatient   03/29/2014  9:36 PM 04/10/2014  8:27 PM Full Code [id number]  Geradine Girt, DO Inpatient   01/25/2014  3:45 PM 01/27/2014  2:59 PM Full Code [id number]  Cristal Ford, DO Inpatient   07/11/2013  5:58 PM 07/14/2013  5:59 PM Full Code [id number]  Annita Brod, MD Inpatient        Prognosis:   Unable to determine  Overall, his functional status remains poor and has decreased over the past several weeks to months.  He is at hight risk for continued decompensation and death.  I think that his prognosis, particularly if he does not have disease modifying therapy (has been declining bone marrow biopsy), is likely < 6 months if his disease follows its natural course.  We did not discuss hospice, but he ought to qualify for these services if so desired.  He wants to wait for results of flow cytometry prior to having further discussion.Marland Kitchen  Discharge Planning:  To Be Determined  Care plan was discussed with patient  Thank you for allowing the Palliative Medicine Team to assist in the care of this patient.   Time In: 0930 Time Out: 1000 Total Time 30 Prolonged Time Billed No      Greater than 50%  of this time was spent counseling and coordinating care related to the above assessment and plan.  Micheline Rough, MD  Please contact Palliative Medicine Team phone at [phone] for questions and concerns.

## 2016-04-13 NOTE — Progress Notes (Signed)
Pt c/o not being able to have a bowel movement along with pain, I asked patient to allow me, Juan [name] to reposition him and he did not want to be repositioned at this time, will continue to monitor patient and encourage him to allow staff to reposition him.

## 2016-04-13 NOTE — Progress Notes (Addendum)
PROGRESS NOTE  Juan [name] HWE:[id number] DOB: 1940-12-29 DOA: 04/06/2016 PCP: [name], MD  Brief History: 75 year old African-American male with a past medical history of coronary artery disease, diabetes, diastolic and systolic CHF, CAD, presented with complaints of SOB, weakness, cough and pain all over. Patient was just hospitalized from 11/15 - 11/18 for community-acquired pneumonia and treated with Levaquin. Patient was discharged home on Levaquin, but notes that he is on a fixed income and couldn't afford to fillthe prescription. Ch366est x-ray at the time of admission showed basilar atelectasis with diffuse airspace opacities concerning for pulmonary edema. The patient was started on intravenous furosemide with good clinical results. Cardiology was consulted and ultimately felt patient was euvolemic.  Given the patient's overall medical condition, they recommended stopping diuretics altogether and using lasix prn only.  Palliative medicine was consulted but, patient not willing to engage in conversation regarding long-term goals  nor is he willing to have his family as part of any discussions regarding his care.  He continues to have very poor insight into his medical condition and continues to refuse to engage in any type of conversation regarding his medical care   Assessment/Plan: Acute on chronic systolic and diastolic CHF -89/38/1017 echo EF 55-60%, grade 1 DD, PASP 33, mild RV dilatation -restart lasix 40 mg IV BID initially -Admission chest x-ray shows pulmonary vascular congestion and pulmonary edema -daily weights--dry weight ~194 -04/10/16 weight 198--patient has refused to be weighed since that period of time -appreciate cardiology consult-->furosemide, hydralazine, imdur stopped 11/24 -d/c hydralazine and imdur due to soft BPs  Pulmonary infiltrates -question if his clinical syndrome is consistent with pneumonia -suspect this is possible leukemic  infiltrates -The patient was discharged on 04/04/2016 with instructionsto finish 5 more days levoflox -d/c levoflox after 11/25dose--completed 5 days during this inpatient stay -procalcitonin 0.43-->3.16-->2.68 -04/10/16 repeat CXR--stable infiltrates  SVT -pt having 1-2 seconds of SVT, asymptomatic--2 episodes on 11/26 -may increase BB if BP is able to tolerate if episodes more frequent or longer in duration  Acute on chronic renal failure--CKD 3 -Baseline creatinine 1.4-1.8 -Monitor with diuresis -serum creatinine peak 3.10 -Likely secondary to hemodynamic changes due to his soft blood pressures and a degree of volume depletion and poor oral intake -consult nephrology -UA -renal US -not a HD candidate  Myelodysplastic syndrome/Myelofibrosis -saw Heme at [facility] July 2017--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 He had 3% blasts in the peripheral blood, which was confirmed by flow cytometry -recommended Jakafi at that time, but pt refused BM bx and was lost to follow up at [facility] -04/07/16 diff with increase WBC precursors -consulted hematology-- Dr. Melissa Montane flow cytometry on 11/27 -iron sat 4 %, ferritin 417, B12--1372, folate 56.7 -He is afebrile and hemodynamically stable  Thrombocytosis -There is a component of myelofibrosis  Diabetes mellitus type 2, controlled -12/26/2015 and hemoglobin A1c 6.1 -Not on any oral agents outpt  Hyperkalemia -kayexalate given on 11/25 and 11/26  GOC -remains full code--discussed with patient -04/12/16--he allowed me to speak with his daughter--she was updated and will try to speak with patient- -04/10/16--consulted palliative medicine--ptnot willing to engage in conversation regarding long-term goals  nor is he willing to have his family as part of any discussions regarding his care. -He continues to have very poor insight into his medical condition and continues to refuse to engage in any type  of conversation regarding his medical care. -11/27--case discussed with Dr. Domingo Cocking   Disposition Plan: SNF  when stable Family Communication: Patient allowed me to speak with daughter on 11/26--updated--Total time spent 35 minutes.  Greater than 50% spent face to face counseling and coordinating care.   Consultants: none  Code Status: FULL   DVT Prophylaxis: [place] Heparin    Procedures: As Listed in Progress Note Above  Antibiotics: Levofloxacin 11/20>>>11/25   Subjective: Patient continues to complain of "pain all over". He states that his breathing is comfortable when he is not moving. Otherwise he has some dyspnea on exertion. Denies any nausea, vomiting, diarrhea, common pain. Denies headache or neck pain.  Objective: Vitals:   04/12/16 0454 04/12/16 2122 04/13/16 0439 04/13/16 1159  BP: 119/67 100/64 (!) 118/58 (!) 103/55  Pulse: (!) 109 95 89 90  Resp: '18 18 18 18  '$ Temp: 97.7 F (36.5 C) 98.2 F (36.8 C) 97.5 F (36.4 C)   TempSrc: Oral Oral Oral   SpO2: 96% 91% 91% 97%  Weight:   90.2 kg (198 lb 12.8 oz)   Height:        Intake/Output Summary (Last 24 hours) at 04/13/16 1257 Last data filed at 04/13/16 0940  Gross per 24 hour  Intake              603 ml  Output              300 ml  Net              303 ml   Weight change: -4.717 kg (-10 lb 6.4 oz) Exam:   General:  Pt is alert, follows commands appropriately, not in acute distress  HEENT: No icterus, No thrush, No neck mass, [place]/AT  Cardiovascular: RRR, S1/S2, no rubs, no gallops  Respiratory: bibasilar crackles, no wheeze  Abdomen: Soft/+BS, non tender, non distended, no guarding  Extremities: 2 + LE edema, No lymphangitis, No petechiae, No rashes, no synovitis   Data Reviewed: I have personally reviewed following labs and imaging studies Basic Metabolic Panel:  Recent Labs Lab 04/09/16 0458 04/10/16 0528 04/11/16 0336 04/12/16 0541 04/13/16 0521  NA 136 134* 134* 135 134*   K 4.2 4.8 5.4* 5.0 4.1  CL 106 102 103 100* 98*  CO2 22 21* 19* 20* 21*  GLUCOSE 109* 123* 115* 108* 108*  BUN 58* 69* 80* 89* 104*  CREATININE 1.94* 2.34* 2.34* 2.55* 3.10*  CALCIUM 9.4 9.3 9.4 9.6 9.2   Liver Function Tests:  Recent Labs Lab 04/07/16 0010  AST 31  ALT 12*  ALKPHOS 76  BILITOT 0.3  PROT 6.3*  ALBUMIN 3.0*   No results for input(s): LIPASE, AMYLASE in the last 168 hours. No results for input(s): AMMONIA in the last 168 hours. Coagulation Profile: No results for input(s): INR, PROTIME in the last 168 hours. CBC:  Recent Labs Lab 04/07/16 0010 04/07/16 0532 04/08/16 0420 04/10/16 0528 04/11/16 0336 04/13/16 0521  WBC 13.2* 10.6* 10.5 14.3* 15.0* 18.7*  NEUTROABS 6.9 6.8  --  9.2* 9.5*  --   HGB 9.1* 8.5* 8.1* 8.7* 8.9* 8.4*  HCT 28.2* 26.8* 25.5* 26.5* 27.2* 26.2*  MCV 78.3 77.5* 78.0 77.7* 76.8* 77.1*  PLT 578* 527* 484* 575* 498* 571*   Cardiac Enzymes:  Recent Labs Lab 04/07/16 0010 04/07/16 0532 04/07/16 1148 04/07/16 1821  TROPONINI 0.03* <0.03 0.10* <0.03   BNP: Invalid input(s): POCBNP CBG: No results for input(s): GLUCAP in the last 168 hours. HbA1C: No results for input(s): HGBA1C in the last 72 hours. Urine analysis:    Component Value  Date/Time   COLORURINE YELLOW 04/07/2016 [place] 04/07/2016 0634   LABSPEC 1.012 04/07/2016 0634   PHURINE 5.0 04/07/2016 [place] 04/07/2016 [place] 05/02/2015 1008   HGBUR NEGATIVE 04/07/2016 [place] 04/07/2016 [place] 04/07/2016 0634   PROTEINUR 100 (A) 04/07/2016 0634   UROBILINOGEN 0.2 05/02/2015 1008   NITRITE NEGATIVE 04/07/2016 0634   LEUKOCYTESUR NEGATIVE 04/07/2016 0634   Sepsis Labs: '@LABRCNTIP'$ (procalcitonin:4,lacticidven:4) )No results found for this or any previous visit (from the past 240 hour(s)).   Scheduled Meds: . aspirin  325 mg Oral Daily  . carvedilol  6.25 mg Oral BID WC   . feeding supplement (ENSURE ENLIVE)  237 mL Oral BID PC  . ferrous gluconate  324 mg Oral Q2200  . fluticasone  2 spray Each Nare Daily  . guaiFENesin  1,200 mg Oral BID  . heparin  5,000 Units Subcutaneous Q8H  . multivitamin with minerals  1 tablet Oral Daily  . sodium chloride flush  3 mL Intravenous Q12H   Continuous Infusions:  Procedures/Studies: Dg Chest 2 View  Result Date: 04/06/2016 CLINICAL DATA:  Shortness of breath. Sore back radiating to both flanks. Discharge from [facility] on Saturday with diagnosis of pneumonia. Continued fever and chills. EXAM: CHEST  2 VIEW COMPARISON:  04/01/2016 FINDINGS: Shallow inspiration with linear atelectasis in the lung bases. Cardiac enlargement. Diffuse airspace opacities in the lungs consistent with edema or multifocal pneumonia. Similar appearance to previous study. No blunting of costophrenic angles. No pneumothorax. Calcified aorta. Thoracic scoliosis convex towards the right. IMPRESSION: Cardiac enlargement. Diffuse airspace disease suggesting edema or pneumonia. Electronically Signed   By: Lucienne Capers M.D.   On: 04/06/2016 23:55   Dg Chest 2 View  Result Date: 04/01/2016 CLINICAL DATA:  Acute onset of fever, cough and body aches. Initial encounter. EXAM: CHEST  2 VIEW COMPARISON:  Chest radiograph performed 12/29/2015 FINDINGS: The lungs are well-aerated. Vascular congestion is noted. Bilateral central airspace opacities may reflect pneumonia or pulmonary edema. No pleural effusion or pneumothorax is seen. The heart is mildly enlarged. No acute osseous abnormalities are seen. IMPRESSION: Vascular congestion and mild cardiomegaly. Bilateral central airspace opacities may reflect pneumonia or pulmonary edema. Electronically Signed   By: Garald Balding M.D.   On: 04/01/2016 03:08   Ct Chest Wo Contrast  Result Date: 04/01/2016 CLINICAL DATA:  Chest pain. Evaluate for aortic aneurysm. History of myeloproliferative disorder.  EXAM: CT CHEST WITHOUT CONTRAST TECHNIQUE: Multidetector CT imaging of the chest was performed following the standard protocol without IV contrast. COMPARISON:  06/21/2014 FINDINGS: Cardiovascular: Ascending thoracic aorta measures up to 4.4 cm and stable. Atherosclerotic calcifications involving the posterior aortic arch. Proximal descending thoracic aorta measures 3.8 cm and stable. Mid descending thoracic aorta measures 3.4 cm and stable. Main pulmonary artery is markedly enlarged measuring 5 cm but stable. Mediastinum/Nodes: Small mediastinal lymph nodes have not significantly changed. There is a 1 cm hypodense nodule or cyst in the right thyroid lobe. There is subcutaneous edema in the chest. No significant pericardial fluid. Lungs/Pleura: Trachea and mainstem bronchi are patent. Again noted is a mosaic attenuation pattern throughout the lungs. This pattern probably represents multiple areas of air trapping. These are prominent pulmonary vessels in the lung bases. Volume loss in the right lower lobe related to elevation of the right hemidiaphragm. There is some septal thickening in the upper lobes. No significant pleural effusions. Upper Abdomen: Again noted  is an enlarged spleen which is only partially visualized. No acute abnormality in the upper abdomen. There is a high-density structure within the stomach lumen. Musculoskeletal: The visualized bones are diffusely sclerotic which is similar to the previous examination. There are small scattered lucent areas throughout the bones. Some of these lucent areas/lesions have enlarged from the prior examination. Lucent lesion along the posterior left sixth rib now measures 1.1 cm and previously measured 0.7 cm. No evidence for pathologic fracture. 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 Recommend annual imaging followup by CTA or MRA. This recommendation follows 2010 ACCF/AHA/AATS/ACR/ASA/SCA/SCAI/SIR/STS/SVM Guidelines  for the Diagnosis and Management of Patients with Thoracic Aortic Disease. Circulation. 2010; 121: S811-S315 Stable enlargement of pulmonary arteries compatible with pulmonary hypertension. Mosaic attenuation pattern throughout both lungs. Similar finding was present on the previous examination. This is probably related to a combination of air trapping and mild edema. Bones are diffusely sclerotic with enlarged lucent lesions. 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 Splenomegaly. This is probably related to the history of myeloproliferative disorder. Electronically Signed   By: Markus Daft M.D.   On: 04/01/2016 11:14   Dg Chest Port 1 View  Result Date: 04/10/2016 CLINICAL DATA:  Acute respiratory failure with hypoxia. Pulmonary edema versus other infectious or leukemic infiltrates. EXAM: PORTABLE CHEST 1 VIEW COMPARISON:  Chest x-rays dated 04/06/2016, 04/01/2016, 12/29/2015 and 01/21/2015. FINDINGS: No significant change compared to the most recent chest x-ray of 04/06/2016, with persistent bilateral perihilar and bibasilar opacities compatible with either pulmonary edema or multifocal pneumonia. No new lung findings. No pleural effusions seen. Heart size and mediastinal contours are stable. Atherosclerotic changes again noted at the aortic arch. Osseous structures appear mottled, particularly about each shoulder, compatible with the mixed sclerotic and lucent lesions described on earlier chest CT of 04/01/2016. IMPRESSION: 1. Persistent perihilar and bibasilar airspace opacities, unchanged compared to the most recent chest x-ray of 04/06/2016, compatible with either pulmonary edema or multifocal pneumonia, favor pulmonary edema. No new lung findings. 2. Mottled appearance of the osseous structures, particularly prominent about each shoulder, compatible with the mixed sclerotic and lucent lesions described on chest CT of 04/01/2016, highly suspicious for metastatic disease. 3. Aortic  atherosclerosis. Electronically Signed   By: Franki Cabot M.D.   On: 04/10/2016 19:06    Brock Larmon, DO  [facility] Pager [phone]  If 7PM-7AM, please contact night-coverage [email or web address] Password [email or web address] 04/13/2016, 12:57 PM   LOS: 5 days

## 2016-04-13 NOTE — Consult Note (Signed)
Reason for Consult: CKD4/AKI Referring Physician: Dr. Jannette Juan [name] is an 75 y.o. male.  HPI: 75 yr male with Hx CHF, CAD, myelodysplasia with thrombocytosis, OSA, Anemia, Ascending AA, Type 2 DM.  Admitted 11/15-11/18 with CAP. Did better but at home weak,more SOB, readmitted 11/21.  Has been diuresed, with minimal wgt change but lower bps.  Has refused PC, has refused Bone Marrow.  Cr at baseline appears 1.7-1.8 but was 2.02 on admit.  Has varied but now has risen to 3.1.  He has been given Kayexalate x 2 for ^ K.  Says he is just week.  No hx to stones , UTIs, or hematuria or FH renal dz. No ACEI/ARB but on topical NSAID.  Admits straining to void and constip. Notes still edema but less SOB. No rash , itching.or joint pain. Constitutional: weak Eyes: negative Ears, nose, mouth, throat, and face: ST Respiratory: no cough, but chills and fever Cardiovascular: negative, edema, sob with exertion Gastrointestinal: constip Genitourinary:as above Integument/breast: negative Hematologic/lymphatic: anemia Musculoskeletal:below Endocrine: mild aches and pains, says not bad Allergic/Immunologic: negative   Past Medical History:  Diagnosis Date  . Allergic rhinitis   . Anemia   . Ascending aortic aneurysm (HCC) 03/2014   4.3cm on CT scan  . CAD (coronary artery disease)    dx elsewheer in past, no documentation. Non-ischemic myoview 2007  . Chronic diastolic CHF (congestive heart failure), NYHA class 2 (HCC)    Normal EF w/ grade 1 dd by echo 12/2015  . Edema    R>Juan [name] leg, u/s 5-12 neg for DVT  . Hemorrhoid   . History of thrombocytosis   . Hypertension   . Migraine    "once/wk at least" (07/11/2013)  . Myeloproliferative disease (HCC)   . Shortness of breath   . Sinus congestion   . Sleep apnea, obstructive    at some point used CPAP, was d/c  years ago  . Type II diabetes mellitus (HCC)     Past Surgical History:  Procedure Laterality Date  . TOE SURGERY Right    "tried to  straighten out big toe" (07/11/2013)    Family History  Problem Relation Age of Onset  . Schizophrenia Son   . Colon cancer Neg Hx   . Prostate cancer Neg Hx   . Heart attack Neg Hx   . Diabetes Neg Hx     Social History:  reports that he quit smoking about 49 years ago. His smoking use included Cigarettes. He has a 3.00 pack-year smoking history. He has never used smokeless tobacco. He reports that he does not drink alcohol or use drugs.  Allergies: No Known Allergies  Medications:  I have reviewed the patient's current medications. Prior to Admission:  Prescriptions Prior to Admission  Medication Sig Dispense Refill Last Dose  . acetaminophen (TYLENOL) 500 MG tablet Take 500-1,000 mg by mouth every 6 (six) hours as needed for moderate pain or headache. Reported on 05/02/2015   04/06/2016 at Unknown time  . aspirin 325 MG tablet Take 1 tablet (325 mg total) by mouth daily.   04/06/2016 at Unknown time  . feeding supplement, GLUCERNA SHAKE, (GLUCERNA SHAKE) LIQD Take 237 mLs by mouth as needed (protein). Reported on 08/21/2015   04/06/2016 at Unknown time  . ferrous gluconate (FERGON) 225 (27 Fe) MG tablet Take 240 mg by mouth daily at 10 pm.   04/06/2016 at Unknown time  . fluticasone (FLONASE) 50 MCG/ACT nasal spray Place 2 sprays into both nostrils daily.  Reported on 05/02/2015   04/06/2016 at Unknown time  . gabapentin (NEURONTIN) 100 MG capsule Take 100 mg by mouth 3 (three) times daily as needed (pain). Reported on 08/22/2015   04/06/2016 at Unknown time  . guaiFENesin (MUCINEX) 600 MG 12 hr tablet Take 2 tablets (1,200 mg total) by mouth 2 (two) times daily. 30 tablet 0 04/06/2016 at Unknown time  . hydrALAZINE (APRESOLINE) 25 MG tablet TAKE 2 TABLETS BY MOUTH 3 TIMES DAILY. (Patient taking differently: TAKE 50 MG BY MOUTH 3 TIMES DAILY.) 180 tablet 3 04/06/2016 at Unknown time  . hydrocerin (EUCERIN) CREA Apply Eucerin cream to BLE Q day after bathing and roughly towel drying to  remove loose skin 228 g 0 04/06/2016 at Unknown time  . isosorbide mononitrate (IMDUR) 30 MG 24 hr tablet TAKE 1 TABLET (30 MG TOTAL) BY MOUTH 2 (TWO) TIMES DAILY. (Patient taking differently: Take 30 mg by mouth 2 (two) times daily as needed (for blood pressure). ) 60 tablet 3 Past Week at Unknown time  . simethicone (MYLICON) 80 MG chewable tablet Chew 80 mg by mouth every 6 (six) hours as needed for flatulence.   04/06/2016 at Unknown time  . torsemide (DEMADEX) 20 MG tablet Take 4 tablets (80 mg total) by mouth daily. 120 tablet 3 04/06/2016 at Unknown time  . vitamin B-12 (CYANOCOBALAMIN) 100 MCG tablet Take 100 mcg by mouth daily.   04/06/2016 at Unknown time  . [DISCONTINUED] carvedilol (COREG) 6.25 MG tablet TAKE 1 TABLET BY MOUTH 2 TIMES DAILY. (Patient taking differently: TAKE 12.5 MG BY MOUTH 2 TIMES DAILY.) 60 tablet 2 04/06/2016 at 1800  . [DISCONTINUED] HYDROcodone-acetaminophen (NORCO/VICODIN) 5-325 MG tablet Take 1 tablet by mouth every 6 (six) hours as needed for moderate pain. 20 tablet 0 04/06/2016 at Unknown time  . [EXPIRED] levofloxacin (LEVAQUIN) 750 MG tablet Take 1 tablet (750 mg total) by mouth daily. 5 tablet 0     Results for orders placed or performed during the hospital encounter of 04/06/16 (from the past 48 hour(s))  Basic metabolic panel     Status: Abnormal   Collection Time: 04/12/16  5:41 AM  Result Value Ref Range   Sodium 135 135 - 145 mmol/Juan [name]   Potassium 5.0 3.5 - 5.1 mmol/Juan [name]   Chloride 100 (Juan [name]) 101 - 111 mmol/Juan [name]   CO2 20 (Juan [name]) 22 - 32 mmol/Juan [name]   Glucose, Bld 108 (H) 65 - 99 mg/dL   BUN 89 (H) 6 - 20 mg/dL   Creatinine, Ser 2.55 (H) 0.61 - 1.24 mg/dL   Calcium 9.6 8.9 - 10.3 mg/dL   GFR calc non Af Amer 23 (Juan [name]) >60 mL/min   GFR calc Af Amer 27 (Juan [name]) >60 mL/min    Comment: (NOTE) The eGFR has been calculated using the CKD EPI equation. This calculation has not been validated in all clinical situations. eGFR's persistently <60 mL/min signify possible Chronic  Kidney Disease.    Anion gap 15 5 - 15  Procalcitonin     Status: None   Collection Time: 04/12/16  5:41 AM  Result Value Ref Range   Procalcitonin 2.68 ng/mL    Comment:        Interpretation: PCT > 2 ng/mL: Systemic infection (sepsis) is likely, unless other causes are known. (NOTE)         ICU PCT Algorithm               Non ICU PCT Algorithm    ----------------------------     ------------------------------  PCT < 0.25 ng/mL                 PCT < 0.1 ng/mL     Stopping of antibiotics            Stopping of antibiotics       strongly encouraged.               strongly encouraged.    ----------------------------     ------------------------------       PCT level decrease by               PCT < 0.25 ng/mL       >= 80% from peak PCT       OR PCT 0.25 - 0.5 ng/mL          Stopping of antibiotics                                             encouraged.     Stopping of antibiotics           encouraged.    ----------------------------     ------------------------------       PCT level decrease by              PCT >= 0.25 ng/mL       < 80% from peak PCT        AND PCT >= 0.5 ng/mL            Continuing antibiotics                                               encouraged.       Continuing antibiotics            encouraged.    ----------------------------     ------------------------------     PCT level increase compared          PCT > 0.5 ng/mL         with peak PCT AND          PCT >= 0.5 ng/mL             Escalation of antibiotics                                          strongly encouraged.      Escalation of antibiotics        strongly encouraged.   PSA     Status: None   Collection Time: 04/12/16  5:41 AM  Result Value Ref Range   PSA 0.75 0.00 - 4.00 ng/mL    Comment: (NOTE) While PSA levels of <=4.0 ng/ml are reported as reference range, some men with levels below 4.0 ng/ml can have prostate cancer and many men with PSA above 4.0 ng/ml do not have prostate  cancer.  Other tests such as free PSA, age specific reference ranges, PSA velocity and PSA doubling time may be helpful especially in men less than 56 years old.   Basic metabolic panel     Status: Abnormal   Collection Time: 04/13/16  5:21 AM  Result Value Ref Range   Sodium 134 (  Juan [name]) 135 - 145 mmol/Juan [name]   Potassium 4.1 3.5 - 5.1 mmol/Juan [name]    Comment: DELTA CHECK NOTED   Chloride 98 (Juan [name]) 101 - 111 mmol/Juan [name]   CO2 21 (Juan [name]) 22 - 32 mmol/Juan [name]   Glucose, Bld 108 (H) 65 - 99 mg/dL   BUN 104 (H) 6 - 20 mg/dL   Creatinine, Ser 3.10 (H) 0.61 - 1.24 mg/dL   Calcium 9.2 8.9 - 10.3 mg/dL   GFR calc non Af Amer 18 (Juan [name]) >60 mL/min   GFR calc Af Amer 21 (Juan [name]) >60 mL/min    Comment: (NOTE) The eGFR has been calculated using the CKD EPI equation. This calculation has not been validated in all clinical situations. eGFR's persistently <60 mL/min signify possible Chronic Kidney Disease.    Anion gap 15 5 - 15  CBC     Status: Abnormal   Collection Time: 04/13/16  5:21 AM  Result Value Ref Range   WBC 18.7 (H) 4.0 - 10.5 K/uL   RBC 3.40 (Juan [name]) 4.22 - 5.81 MIL/uL   Hemoglobin 8.4 (Juan [name]) 13.0 - 17.0 g/dL   HCT 26.2 (Juan [name]) 39.0 - 52.0 %   MCV 77.1 (Juan [name]) 78.0 - 100.0 fL   MCH 24.7 (Juan [name]) 26.0 - 34.0 pg   MCHC 32.1 30.0 - 36.0 g/dL   RDW 19.3 (H) 11.5 - 15.5 %   Platelets 571 (H) 150 - 400 K/uL    US Renal  Result Date: 04/13/2016 CLINICAL DATA:  Acute on chronic renal failure EXAM: RENAL / URINARY TRACT ULTRASOUND COMPLETE COMPARISON:  04/03/2014 FINDINGS: Right Kidney: Length: 11.1 cm. Increased echotexture. 2.8 cm lower pole cyst. Mild right hydronephrosis. Left Kidney: Length: 12.7 cm. Increased echotexture. There is severe left hydronephrosis. 1.4 cm echogenic, shadowing area in the lower pole, likely nonobstructing left renal stone. Bladder: Multiple large cystic areas within the pelvis which appear to be contiguous with the bladder compatible with diverticula and trabeculations, stable. IMPRESSION: Mild right hydronephrosis  and severe left hydronephrosis. 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 Probable 14 mm nonobstructing left lower pole renal stone. Increased echotexture in the kidneys compatible with chronic medical renal disease. Electronically Signed   By: Rolm Baptise M.D.   On: 04/13/2016 13:01    ROS Blood pressure (!) 103/55, pulse 90, temperature 97.5 F (36.4 C), temperature source Oral, resp. rate 18, height '6\' 3"'$  (1.905 m), weight 90.2 kg (198 lb 12.8 oz), SpO2 97 %. Physical Exam Physical Examination: General appearance - slow mentation, moves very little Mental status - alert, oriented to person, place, and time, drowsy Eyes - pupils equal and reactive, extraocular eye movements intact, funduscopic exam normal, discs flat and sharp Mouth - thrush noted Neck - adenopathy noted PCL Lymphatics - posterior cervical nodes Chest - decreased air entry noted bilat  Heart - reg, prematures, Gr 2/6 m,   Abdomen - lower abdm distension with palp bladder,  Feels has to void with pressure Musculoskeletal - no joint tenderness, deformity or swelling Extremities - pedal edema 2-3+ + Skin - sry , no rash  Assessment/Plan: 1 CKD 3 with AKI .  Suspect obstruct vs drug toxicity vs hemodynamic.  Needs Foley with exam findings.  Will check Urine chem, UA also 2 Recent pneu  ? relationship 3 Hypertension: low bps, lower coreg 4. Anemia Fe defic, multifactorial 5. Metabolic Bone Disease: check PTH 6 CHF  Has vol xs but hold off diuresis 7 Myelodysplasia 8 DM P lower coreg, Foley, U/S, Urine chem, UA,  PTH  Juan Juan [name],Juan Juan [name] 04/13/2016, 5:16 PM

## 2016-04-13 NOTE — Care Management Important Message (Signed)
Important Message  Patient Details  Name: Juan [name] MRN: [id number] Date of Birth: 11-14-1940   Medicare Important Message Given:  Yes    [name] May 04/13/2016, 11:36 AM

## 2016-04-13 NOTE — Clinical Social Work Note (Signed)
CSW continues to follow for discharge needs.  Ambriana Selway, CSW [phone]  

## 2016-04-14 ENCOUNTER — Inpatient Hospital Stay (HOSPITAL_COMMUNITY): Payer: Medicare Other

## 2016-04-14 DIAGNOSIS — K5900 Constipation, unspecified: Secondary | ICD-10-CM

## 2016-04-14 DIAGNOSIS — N189 Chronic kidney disease, unspecified: Secondary | ICD-10-CM

## 2016-04-14 DIAGNOSIS — N183 Chronic kidney disease, stage 3 (moderate): Secondary | ICD-10-CM

## 2016-04-14 DIAGNOSIS — N170 Acute kidney failure with tubular necrosis: Secondary | ICD-10-CM

## 2016-04-14 DIAGNOSIS — N1330 Unspecified hydronephrosis: Secondary | ICD-10-CM

## 2016-04-14 DIAGNOSIS — N1339 Other hydronephrosis: Secondary | ICD-10-CM

## 2016-04-14 DIAGNOSIS — N179 Acute kidney failure, unspecified: Secondary | ICD-10-CM

## 2016-04-14 LAB — COMPREHENSIVE METABOLIC PANEL
ALT: 12 U/L — AB (ref 17–63)
AST: 25 U/L (ref 15–41)
Albumin: 2.1 g/dL — ABNORMAL LOW (ref 3.5–5.0)
Alkaline Phosphatase: 82 U/L (ref 38–126)
Anion gap: 11 (ref 5–15)
BILIRUBIN TOTAL: 0.7 mg/dL (ref 0.3–1.2)
BUN: 106 mg/dL — AB (ref 6–20)
CHLORIDE: 101 mmol/L (ref 101–111)
CO2: 23 mmol/L (ref 22–32)
CREATININE: 2.76 mg/dL — AB (ref 0.61–1.24)
Calcium: 8.9 mg/dL (ref 8.9–10.3)
GFR calc Af Amer: 24 mL/min — ABNORMAL LOW (ref >60)
GFR, EST NON AFRICAN AMERICAN: 21 mL/min — AB (ref >60)
Glucose, Bld: 104 mg/dL — ABNORMAL HIGH (ref 65–99)
Potassium: 3.4 mmol/L — ABNORMAL LOW (ref 3.5–5.1)
Sodium: 135 mmol/L (ref 135–145)
TOTAL PROTEIN: 5.1 g/dL — AB (ref 6.5–8.1)

## 2016-04-14 LAB — BASIC METABOLIC PANEL
BUN: 106 mg/dL — AB (ref 4–21)
Creatinine: 2.8 mg/dL — AB (ref 0.6–1.3)
Glucose: 104 mg/dL
Potassium: 3.4 mmol/L (ref 3.4–5.3)
Sodium: 135 mmol/L — AB (ref 137–147)

## 2016-04-14 LAB — CBC AND DIFFERENTIAL
HEMATOCRIT: 23 % — AB (ref 41–53)
Hemoglobin: 7.4 g/dL — AB (ref 13.5–17.5)
PLATELETS: 491 10*3/uL — AB (ref 150–399)
WBC: 18.4 10*3/mL

## 2016-04-14 LAB — CBC
HEMATOCRIT: 22.9 % — AB (ref 39.0–52.0)
Hemoglobin: 7.4 g/dL — ABNORMAL LOW (ref 13.0–17.0)
MCH: 24.7 pg — ABNORMAL LOW (ref 26.0–34.0)
MCHC: 32.3 g/dL (ref 30.0–36.0)
MCV: 76.6 fL — ABNORMAL LOW (ref 78.0–100.0)
Platelets: 491 10*3/uL — ABNORMAL HIGH (ref 150–400)
RBC: 2.99 MIL/uL — ABNORMAL LOW (ref 4.22–5.81)
RDW: 19.7 % — AB (ref 11.5–15.5)
WBC: 18.4 10*3/uL — ABNORMAL HIGH (ref 4.0–10.5)

## 2016-04-14 LAB — HEPATIC FUNCTION PANEL: Bilirubin, Total: 0.7 mg/dL

## 2016-04-14 LAB — PHOSPHORUS: PHOSPHORUS: 4.5 mg/dL (ref 2.5–4.6)

## 2016-04-14 MED ORDER — POLYETHYLENE GLYCOL 3350 17 G PO PACK
17.0000 g | PACK | Freq: Every day | ORAL | Status: DC
  Administered 2016-04-14 – 2016-04-18 (×5): 17 g via ORAL
  Filled 2016-04-14 (×5): qty 1

## 2016-04-14 MED ORDER — SODIUM CHLORIDE 0.9 % IV SOLN
510.0000 mg | INTRAVENOUS | Status: DC
  Administered 2016-04-14: 510 mg via INTRAVENOUS
  Filled 2016-04-14: qty 17

## 2016-04-14 MED ORDER — BISACODYL 10 MG RE SUPP: 10.0000 mg | Freq: Every day | RECTAL | Status: DC | PRN

## 2016-04-14 MED ORDER — SORBITOL 70 % SOLN
60.0000 mL | Status: DC | PRN
  Administered 2016-04-14: 60 mL via ORAL
  Filled 2016-04-14: qty 60

## 2016-04-14 NOTE — Progress Notes (Signed)
Physical Therapy Treatment Patient Details Name: Juan [name] MRN: [id number] DOB: 25-Jul-1940 Today's Date: 04/14/2016    History of Present Illness 75 y.o. male with recent hospitalization 11/15-11/18/17 for PNA re-admitted with CHF exacerbation. PMH of CAD, DM, CHF, chronic pain, HTN.     PT Comments    Pt agreeable to get OOB. Requested to go to [facility] before getting into chair. Used Denna Haggard and heavy 2 person assist to perform. Pt incontinent of stool with every transfer before and after sitting on BSC. Cleaned multiple times. Pt becoming very impatient when trying to get him cleaned. Explained we were trying to get him cleaned up. Pt refused to get back bed to continue to be cleaned up. Eventually got to recliner and was cleaned up as best as possible. Pt refused to have legs elevated in recliner. Maximove lift pad in place in chair due to pt being very difficult transfer especially from low surfaces.   Follow Up Recommendations  SNF     Equipment Recommendations  None recommended by PT    Recommendations for Other Services       Precautions / Restrictions Precautions Precautions: Fall Restrictions Weight Bearing Restrictions: No    Mobility  Bed Mobility Overal bed mobility: Needs Assistance Bed Mobility: Supine to Sit     Supine to sit: +2 for physical assistance;Max assist     General bed mobility comments: Assist to bring legs off bed, elevate trunk into sitting, and to bring hips to EOB  Transfers Overall transfer level: Needs assistance Equipment used: Ambulation equipment used Transfers: Sit to/from [facility] Sit to Stand: +2 physical assistance;Mod assist;Max assist Stand pivot transfers: +2 physical assistance;Mod assist;Max assist (with Denna Haggard)       General transfer comment: Assist to bring hips and trunk up. Pivot from bed to bsc to recliner with Denna Haggard. Pt requiring more assist from lower surfaces. Pt rises with trunk flexed  forward at hips and then straightens up trunk after knees extended. Stood 4-5 times with pt incontinent of stool each time. Stood for 1-2 minutes to be cleaned each time.  Ambulation/Gait                 Stairs            Wheelchair Mobility    Modified Rankin (Stroke Patients Only)       Balance Overall balance assessment: Needs assistance Sitting-balance support: Bilateral upper extremity supported Sitting balance-Leahy Scale: Poor Sitting balance - Comments: BUE support and min A Postural control: Posterior lean Standing balance support: Bilateral upper extremity supported Standing balance-Leahy Scale: Poor Standing balance comment: Stedy and min to +2 mod Assist for static standing                    Cognition Arousal/Alertness: Awake/alert Behavior During Therapy: Flat affect Overall Cognitive Status: Within Functional Limits for tasks assessed                      Exercises      General Comments        Pertinent Vitals/Pain Pain Assessment: 0-10 Pain Score: 8  Pain Location: back Pain Descriptors / Indicators: Aching Pain Intervention(s): Limited activity within patient's tolerance;Monitored during session;Premedicated before session;Repositioned    Home Living                      Prior Function  PT Goals (current goals can now be found in the care plan section) Progress towards PT goals: Progressing toward goals    Frequency    Min 2X/week      PT Plan Current plan remains appropriate;Frequency needs to be updated    Co-evaluation             End of Session Equipment Utilized During Treatment: Gait belt Activity Tolerance: Patient limited by fatigue Patient left: with call [place]/phone within reach;in chair;with family/visitor present     Time: 1439-1520 PT Time Calculation (min) (ACUTE ONLY): 41 min  Charges:  $Therapeutic Activity: 38-52 mins                    G CodesShary Decamp [facility] 04-27-2016, 3:46 PM [employer] PT [phone]

## 2016-04-14 NOTE — Consult Note (Signed)
I have been asked to see the patient by Dr. [name] Tat, for evaluation and management of bilateral hydronephrosis.  History of present illness:  75 year old African-American male with multiple comorbidities who we have been asked to see secondary to his bilateral hydronephrosis and acute renal failure. The patient has baseline chronic renal insufficiency but over the past several days his creatinine has risen. On physical exam, the patient was noted to have distended suprapubic region consistent with a full bladder. Ultrasound obtained of the kidneys demonstrated bilateral hydronephrosis as well as an overly distended bladder with numerous diverticulum. Foley catheter was placed at approximately 1700 mL of urine immediately returned.  A follow-up CT scan, stone protocol, demonstrated no evidence of obstruction with resolution of his hydronephrosis with the Foley placement. His prostate did not appear to be overly enlarged on the CT scan. Further, there is no retroperitoneal or periprostatic lymphadenopathy.  The patient does not have a extensive urologic history in the sense that he has not seen a urologist that he endorses. However, he does state that he has had for quite some time obstructive voiding symptoms including nocturia, straining to void or difficulty initiating his stream, intermittency, and a weak stream. He also states that he feels as if he did not empty his bladder well. The patient was not taking any medications for this.   The history was somewhat limited due to the patient's condition, he did not open his eyes throughout the entire interview.  Review of systems: A 12 point comprehensive review of systems was obtained and is negative unless otherwise stated in the history of present illness.  Patient Active Problem List   Diagnosis Date Noted  . Constipation   . Acute on chronic renal failure ([place])   . Hydronephrosis   . 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 Acute respiratory failure with hypoxia ([place]) 04/08/2016  . CHF exacerbation ([place]) 04/07/2016  . Acute renal failure superimposed on chronic kidney disease ([place]) 04/07/2016  . Thrombocytosis ([place]) 04/07/2016  . Anemia 04/07/2016  . Community acquired pneumonia 04/01/2016  . Pneumonia 04/01/2016  . Pressure injury of skin 04/01/2016  . Pressure ulcer stage II 12/31/2015  . UTI (urinary tract infection) 12/30/2015  . Sepsis ([place]) 12/29/2015  . UTI (lower urinary tract infection) 12/29/2015  . T wave inversion in EKG 12/29/2015  . Goals of care, counseling/discussion 05/02/2015  . Acute on chronic systolic congestive heart failure ([place]) 02/14/2015  . CKD (chronic kidney disease), stage III 01/22/2015  . Pain in limb   . Peripheral edema   . Unable to ambulate   . Thoracic aortic aneurysm ([place]) 04/26/2014  . CKD (chronic kidney disease) stage 3, GFR 30-59 ml/min 04/26/2014  . 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 Chest pain 04/15/2014  . Encounter for palliative care 04/06/2014  . Weakness generalized 04/06/2014  . Venous stasis dermatitis of both lower extremities   . Malnutrition of moderate degree ([place]) 03/31/2014  . Morbid obesity due to excess calories ([place]) 03/29/2014  . 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 Leukocytosis 10/17/2013  . Poor compliance with advise  05/05/2012  . Annual physical exam 01/14/2012  . Weight loss 01/14/2012  . BPH (benign prostatic hyperplasia) 01/14/2012  . *Chronic lower extremity edema, stasis dermatitis 06/09/2011  . CARDIOMEGALY 04/29/2009  . *Prediabetes 09/07/2006  . Symptomatic anemia 09/07/2006  . Myeloproliferative disease ([place]) 09/07/2006  . Obstructive sleep apnea 09/07/2006  . Essential hypertension 09/07/2006  . Coronary atherosclerosis  09/07/2006  . *CHF-- PCP comments 09/07/2006  . ALLERGIC RHINITIS 09/07/2006    No current facility-administered medications on file prior to encounter.     Current Outpatient Prescriptions on File Prior to Encounter  Medication Sig Dispense Refill  . acetaminophen (TYLENOL) 500 MG tablet Take 500-1,000 mg by mouth every 6 (six) hours as needed for moderate pain or headache. Reported on 05/02/2015    . aspirin 325 MG tablet Take 1 tablet (325 mg total) by mouth daily.    . feeding supplement, GLUCERNA SHAKE, (GLUCERNA SHAKE) LIQD Take 237 mLs by mouth as needed (protein). Reported on 08/21/2015    . ferrous gluconate (FERGON) 225 (27 Fe) MG tablet Take 240 mg by mouth daily at 10 pm.    . fluticasone (FLONASE) 50 MCG/ACT nasal spray Place 2 sprays into both nostrils daily. Reported on 05/02/2015    . gabapentin (NEURONTIN) 100 MG capsule Take 100 mg by mouth 3 (three) times daily as needed (pain). Reported on 08/22/2015    . guaiFENesin (MUCINEX) 600 MG 12 hr tablet Take 2 tablets (1,200 mg total) by mouth 2 (two) times daily. 30 tablet 0  . hydrALAZINE (APRESOLINE) 25 MG tablet TAKE 2 TABLETS BY MOUTH 3 TIMES DAILY. (Patient taking differently: TAKE 50 MG BY MOUTH 3 TIMES DAILY.) 180 tablet 3  . hydrocerin (EUCERIN) CREA Apply Eucerin cream to BLE Q day after bathing and roughly towel drying to remove loose skin 228 g 0  . isosorbide mononitrate (IMDUR) 30 MG 24 hr tablet TAKE 1 TABLET (30 MG TOTAL) BY MOUTH 2 (TWO) TIMES DAILY. (Patient taking differently: Take 30 mg by mouth 2 (two) times daily as needed (for blood pressure). ) 60 tablet 3  . simethicone (MYLICON) 80 MG chewable tablet Chew 80 mg by mouth every 6 (six) hours as needed for flatulence.    . torsemide (DEMADEX) 20 MG tablet Take 4 tablets (80 mg total) by mouth daily. 120 tablet 3  . vitamin B-12 (CYANOCOBALAMIN) 100 MCG tablet Take 100 mcg by mouth daily.      Past Medical History:  Diagnosis Date  . Allergic rhinitis   . Anemia   . Ascending aortic aneurysm ([place]) 03/2014   4.3cm on CT scan  . CAD (coronary artery disease)    dx elsewheer in past, no documentation. Non-ischemic  myoview 2007  . Chronic diastolic CHF (congestive heart failure), NYHA class 2 (H[facility])    Normal EF w/ grade 1 dd by echo 12/2015  . Edema    R>L leg, u/s 5-12 neg for DVT  . Hemorrhoid   . History of thrombocytosis   . Hypertension   . Migraine    "once/wk at least" (07/11/2013)  . Myeloproliferative disease ([place])   . Shortness of breath   . Sinus congestion   . Sleep apnea, obstructive    at some point used CPAP, was d/c  years ago  . Type II diabetes mellitus ([place])     Past Surgical History:  Procedure Laterality Date  . TOE SURGERY Right    "tried to straighten out big toe" (07/11/2013)    Social History  Substance Use Topics  . Smoking status: Former Smoker    Packs/day: 0.25    Years: 12.00    Types: Cigarettes    Quit date: 05/18/1966  . Smokeless tobacco: Never Used     Comment: quit smoking 45 years ago  . Alcohol use No    Family History  Problem Relation Age of Onset  .  Schizophrenia Son   . Colon cancer Neg Hx   . Prostate cancer Neg Hx   . Heart attack Neg Hx   . Diabetes Neg Hx     PE: Vitals:   04/13/16 2003 04/14/16 0641 04/14/16 1148 04/14/16 2130  BP: 108/62 (!) 108/52 (!) 102/54 (!) 112/52  Pulse: 91 80 84 85  Resp: '18 16 18 18  '$ Temp: 98.6 F (37 C) 98.6 F (37 C) 98.4 F (36.9 C) 99 F (37.2 C)  TempSrc: Oral Oral Oral Oral  SpO2: 98% 99% 97% 94%  Weight:  85.5 kg (188 lb 9.6 oz)    Height:       Patient appears to be in no acute distress  patient is alert and oriented x3 Atraumatic normocephalic head No cervical or supraclavicular lymphadenopathy appreciated No increased work of breathing, no audible wheezes/rhonchi Regular sinus rhythm/rate Abdomen is soft, nontender, nondistended, no CVA or suprapubic tenderness Rectal exam: I was unable to obtain a rectal exam due to the patient's unwillingness to cooperate Lower extremities are symmetric Pitting edema Grossly neurologically intact No identifiable skin lesions   Recent Labs   04/13/16 0521 04/14/16 0350  WBC 18.7* 18.4*  HGB 8.4* 7.4*  HCT 26.2* 22.9*    Recent Labs  04/12/16 0541 04/13/16 0521 04/14/16 0350  NA 135 134* 135  K 5.0 4.1 3.4*  CL 100* 98* 101  CO2 20* 21* 23  GLUCOSE 108* 108* 104*  BUN 89* 104* 106*  CREATININE 2.55* 3.10* 2.76*  CALCIUM 9.6 9.2 8.9   No results for input(s): LABPT, INR in the last 72 hours. No results for input(s): LABURIN in the last 72 hours. Results for orders placed or performed during the hospital encounter of 04/01/16  Urine culture     Status: None   Collection Time: 04/01/16  2:40 AM  Result Value Ref Range Status   Specimen Description URINE, RANDOM  Final   Special Requests NONE  Final   Culture NO GROWTH  Final   Report Status 04/02/2016 FINAL  Final  Blood culture (routine x 2)     Status: None   Collection Time: 04/01/16  2:42 AM  Result Value Ref Range Status   Specimen Description BLOOD RIGHT ARM  Final   Special Requests BOTTLES DRAWN AEROBIC AND ANAEROBIC 5ML  Final   Culture NO GROWTH 5 DAYS  Final   Report Status 04/06/2016 FINAL  Final  Blood culture (routine x 2)     Status: None   Collection Time: 04/01/16  4:26 AM  Result Value Ref Range Status   Specimen Description BLOOD LEFT HAND  Final   Special Requests IN PEDIATRIC BOTTLE 4ML  Final   Culture NO GROWTH 5 DAYS  Final   Report Status 04/06/2016 FINAL  Final  Culture, sputum-assessment     Status: None   Collection Time: 04/01/16  8:42 AM  Result Value Ref Range Status   Specimen Description EXPECTORATED SPUTUM  Final   Special Requests NONE  Final   Sputum evaluation   Final    THIS SPECIMEN IS ACCEPTABLE. RESPIRATORY CULTURE REPORT TO FOLLOW.   Report Status 04/01/2016 FINAL  Final  Culture, respiratory (NON-Expectorated)     Status: None   Collection Time: 04/01/16  8:42 AM  Result Value Ref Range Status   Specimen Description EXPECTORATED SPUTUM  Final   Special Requests NONE  Final   Gram Stain   Final    NO WBC  SEEN MODERATE GRAM POSITIVE COCCI  IN PAIRS IN SINGLES MODERATE GRAM NEGATIVE RODS MODERATE GRAM VARIABLE ROD MODERATE GRAM POSITIVE RODS MODERATE YEAST FEW SQUAMOUS EPITHELIAL CELLS PRESENT    Culture Consistent with normal respiratory flora.  Final   Report Status 04/03/2016 FINAL  Final    Imaging: CT scan: Abdomen/pelvis, noncontrast- no evidence of structure and of either kidney, there is been resolution of his hydronephrosis when comparing it to the ultrasound, the bladder is decompressed but there are areas of diverticulum that are undrained. There is no lymphadenopathy or any other significant or unusual findings from a urinary tract perspective. The official read is pending.  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 The today, the patient does not have a significant history of UTI that he endorses. He does endorse baseline severe lower urinary tract symptoms. The etiology of his hydronephrosis is almost certainly obstructive in nature as the hydro-improved after the Foley catheter.  Recommendations: The patient's likely to need the Foley catheter for an indefinite amount of time. I would leave the catheter in and have the patient follow-up as an outpatient to discuss catheter removal. Given the severity of the bladder diverticulum noted on his ultrasound, I suspect that he has not indeed his bladder for a very long time. He likely has very little detrusor function.  If the patient is still in the hospital or a rehabilitation facility, his catheter should be changed every 4 weeks.  Thank you for involving me in this patient's care, I will follow-up with the patient for the next day or 2. Louis Meckel W

## 2016-04-14 NOTE — Progress Notes (Signed)
Subjective: Interval History: has complaints constip.  Objective: Vital signs in last 24 hours: Temp:  [98.4 F (36.9 C)-98.6 F (37 C)] 98.4 F (36.9 C) (11/28 1148) Pulse Rate:  [80-91] 84 (11/28 1148) Resp:  [16-18] 18 (11/28 1148) BP: (102-108)/(52-62) 102/54 (11/28 1148) SpO2:  [97 %-99 %] 97 % (11/28 1148) FiO2 (%):  [2 %] 2 % (11/28 1148) Weight:  [85.5 kg (188 lb 9.6 oz)] 85.5 kg (188 lb 9.6 oz) (11/28 0641) Weight change: -4.627 kg (-10 lb 3.2 oz)  Intake/Output from previous day: 11/27 0701 - 11/28 0700 In: 761 [P.O.:870; I.V.:3] Out: 2000 [Urine:2000] Intake/Output this shift: Total I/O In: 123 [P.O.:120; I.V.:3] Out: 425 [Urine:425]  General appearance: alert, cooperative, mild distress and pale Resp: diminished breath sounds bilaterally Cardio: S1, S2 normal and systolic murmur: holosystolic 2/6, blowing at apex GI: less distended, pos bs, soft Extremities: edema 2+  Lab Results:  Recent Labs  04/13/16 0521 04/14/16 0350  WBC 18.7* 18.4*  HGB 8.4* 7.4*  HCT 26.2* 22.9*  PLT 571* 491*   BMET:  Recent Labs  04/13/16 0521 04/14/16 0350  NA 134* 135  K 4.1 3.4*  CL 98* 101  CO2 21* 23  GLUCOSE 108* 104*  BUN 104* 106*  CREATININE 3.10* 2.76*  CALCIUM 9.2 8.9   No results for input(s): PTH in the last 72 hours. Iron Studies: No results for input(s): IRON, TIBC, TRANSFERRIN, FERRITIN in the last 72 hours.  Studies/Results: US Renal  Result Date: 04/13/2016 CLINICAL DATA:  Acute on chronic renal failure EXAM: RENAL / URINARY TRACT ULTRASOUND COMPLETE COMPARISON:  04/03/2014 FINDINGS: Right Kidney: Length: 11.1 cm. Increased echotexture. 2.8 cm lower pole cyst. Mild right hydronephrosis. Left Kidney: Length: 12.7 cm. Increased echotexture. There is severe left hydronephrosis. 1.4 cm echogenic, shadowing area in the lower pole, likely nonobstructing left renal stone. Bladder: Multiple large cystic areas within the pelvis which appear to be  contiguous with the bladder compatible with diverticula and trabeculations, stable. IMPRESSION: Mild right hydronephrosis and severe left hydronephrosis. 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 Probable 14 mm nonobstructing left lower pole renal stone. Increased echotexture in the kidneys compatible with chronic medical renal disease. Electronically Signed   By: Rolm Baptise M.D.   On: 04/13/2016 13:01    I have reviewed the patient's current medications.  Assessment/Plan: 1 AKI/CKD obstruction worsened underlying renal function.  Will need Urology eval. Low FENA with CHF  But now diuresing post obstructive. Will culture 2 Pyuria culture 3 Constip 4 myelodysplasia 5 CHF 6 Debill P laxative, foley culture urine, iv Fe   LOS: 6 days   Juan [name],Juan [name] 04/14/2016,12:27 PM

## 2016-04-14 NOTE — Progress Notes (Signed)
PROGRESS NOTE  Juan [name] FBP:[id number] DOB: 07-28-1940 DOA: 04/06/2016 PCP: [name], MD  Brief History: 75 year old African-American male with a past medical history of coronary artery disease, diabetes, diastolic and systolic CHF, CAD, presented with complaints of SOB, weakness, cough and pain all over. Patient was just hospitalized from 11/15 - 11/18 for community-acquired pneumonia and treated with Levaquin. Patient was discharged home on Levaquin, but notes that he is on a fixed income and couldn't afford to fillthe prescription. Chest x-ray at the time of admission showed basilar atelectasis with diffuse airspace opacities concerning for pulmonary edema. The patient was started on intravenous furosemide with good clinical results. Cardiology was consulted and ultimately felt patient was euvolemic. Therefore his diuretics were discontinued, and cardiology signed off.Given the patient's overall medical condition, they recommended stopping diuretics altogether and using lasix prn only. Palliative medicine was consulted but, patient not willing to engage in conversation regarding long-term goals nor is he willing to have his family as part of any discussions regarding his care.  He continues to have very poor insight into his medical condition and continues to refuse to engage in any type of conversation regarding his medical care. During his hospital stay, the patient has developed acute on chronic renal failure for which nephrology was consulted. Renal ultrasound showed obstructive uropathy. Urology has been consulted to assist with management.   Assessment/Plan: Acute on chronic systolic and diastolic CHF -82/42/3536 echo EF 55-60%, grade 1 DD, PASP 33, mild RV dilatation -restart lasix 40 mg IV BID initially -Admission chest x-ray shows pulmonary vascular congestion and pulmonary edema -daily weights--dry weight ~194 -04/14/16 weight 188--patient has refused to be weighed  since that period of time -appreciate cardiology consult-->furosemide, hydralazine, imdur stopped 11/24--cardiology signed off -d/c hydralazine and imdur due to soft BPs  Pulmonary infiltrates -doubt if his clinical syndrome was consistent with pneumonia -suspect this is possible leukemic infiltrates -The patient was discharged on 04/04/2016 with instructionsto finish 5 more days levoflox -d/c levoflox after 11/25dose--completed 5 days during this inpatient stay -procalcitonin 0.43-->3.16-->2.68 -04/10/16 repeat CXR--stable infiltrates -stable on 2 L [place]  SVT -pt having 1-2 seconds of SVT, asymptomatic--2 episodes on 11/26 -may increase BB if BP is able to tolerate if episodes more frequent or longer in duration -no SVT since 11/26  Acute on chronic renal failure--CKD 3 -Baseline creatinine 1.4-1.8 -Monitor with diuresis -serum creatinine peak 3.10 -Likely secondary to hemodynamic changes due to his soft blood pressures and a degree of volume depletion and poor oral intake and obstructive uropathy -appreciate nephrology -renal US--mild R, severe L-hydronephrosis -not a HD candidate  Obstructive Uropathy -11/27--renal US--mild R, severe L-hydronephrosis -11/28 consulted urology  Myelodysplastic syndrome/Myelofibrosis -saw Heme at [facility] July 2017--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 He had 3% blasts in the peripheral blood, which was confirmed by flow cytometry -recommended Jakafi at that time, but pt refused BM bx and was lost to follow up at [place] [facility] -04/07/16 diff with increase WBC precursors -consulted hematology-- Dr. Melissa Montane flow cytometry-->3% myeloblastic cells -Dr. Jana Hakim to f/u with patient on 11/29 -iron sat 4 %, ferritin 417, B12--1372, folate 56.7 -He is afebrile and hemodynamically stable  Thrombocytosis/Leukocytosis -There is a component of myelofibrosis  Diabetes mellitus type 2, controlled -12/26/2015 and  hemoglobin A1c 6.1 -Not on any oral agents outpt  Hyperkalemia -kayexalate given on 11/25 and 11/26  GOC -remains full code--discussed with patient -04/12/16--he allowed me to speak with his daughter--she was updated  and will try to speak with patient- -04/10/16--consulted palliative medicine--ptnot willing to engage in conversation regarding long-term goals nor is he willing to have his family as part of any discussions regarding his care. -He continues to have very poor insight into his medical condition and continues to refuse to engage in any type of conversation regarding his medical care. -11/27--case discussed with Dr. Domingo Cocking   Disposition Plan: SNF when cleared by consultants Family Communication: Patient allowed me to speak with daughter on 11/26--updated   Consultants: none  Code Status: FULL   DVT Prophylaxis: Goff Heparin    Procedures: As Listed in Progress Note Above  Antibiotics: Levofloxacin 11/20>>>11/25     Subjective: Patient states that he hurts "all over". He states that he is breathing better. He is having bowel movements. Denies any nausea, vomiting, diarrhea, abdominal pain. No dysuria or hematuria.  Objective: Vitals:   04/13/16 1159 04/13/16 2003 04/14/16 0641 04/14/16 1148  BP: (!) 103/55 108/62 (!) 108/52 (!) 102/54  Pulse: 90 91 80 84  Resp: '18 18 16 18  '$ Temp:  98.6 F (37 C) 98.6 F (37 C) 98.4 F (36.9 C)  TempSrc:  Oral Oral Oral  SpO2: 97% 98% 99% 97%  Weight:   85.5 kg (188 lb 9.6 oz)   Height:        Intake/Output Summary (Last 24 hours) at 04/14/16 1836 Last data filed at 04/14/16 1454  Gross per 24 hour  Intake              513 ml  Output             2051 ml  Net            -1538 ml   Weight change: -4.627 kg (-10 lb 3.2 oz) Exam:   General:  Pt is alert, follows commands appropriately, not in acute distress  HEENT: No icterus, No thrush, No neck mass, Theresa/AT  Cardiovascular: RRR, S1/S2, no rubs,  no gallops  Respiratory:Bibasilar crackles but no wheezing. Good air movement.  Abdomen: Soft/+BS, non tender, non distended, no guarding  Extremities: 1 + LE edema, No lymphangitis, No petechiae, No rashes, no synovitis   Data Reviewed: I have personally reviewed following labs and imaging studies Basic Metabolic Panel:  Recent Labs Lab 04/10/16 0528 04/11/16 0336 04/12/16 0541 04/13/16 0521 04/14/16 0350  NA 134* 134* 135 134* 135  K 4.8 5.4* 5.0 4.1 3.4*  CL 102 103 100* 98* 101  CO2 21* 19* 20* 21* 23  GLUCOSE 123* 115* 108* 108* 104*  BUN 69* 80* 89* 104* 106*  CREATININE 2.34* 2.34* 2.55* 3.10* 2.76*  CALCIUM 9.3 9.4 9.6 9.2 8.9  PHOS  --   --   --   --  4.5   Liver Function Tests:  Recent Labs Lab 04/14/16 0350  AST 25  ALT 12*  ALKPHOS 82  BILITOT 0.7  PROT 5.1*  ALBUMIN 2.1*   No results for input(s): LIPASE, AMYLASE in the last 168 hours. No results for input(s): AMMONIA in the last 168 hours. Coagulation Profile: No results for input(s): INR, PROTIME in the last 168 hours. CBC:  Recent Labs Lab 04/08/16 0420 04/10/16 0528 04/11/16 0336 04/13/16 0521 04/14/16 0350  WBC 10.5 14.3* 15.0* 18.7* 18.4*  NEUTROABS  --  9.2* 9.5*  --   --   HGB 8.1* 8.7* 8.9* 8.4* 7.4*  HCT 25.5* 26.5* 27.2* 26.2* 22.9*  MCV 78.0 77.7* 76.8* 77.1* 76.6*  PLT 484* 575* 498* 571*  491*   Cardiac Enzymes: No results for input(s): CKTOTAL, CKMB, CKMBINDEX, TROPONINI in the last 168 hours. BNP: Invalid input(s): POCBNP CBG: No results for input(s): GLUCAP in the last 168 hours. HbA1C: No results for input(s): HGBA1C in the last 72 hours. Urine analysis:    Component Value Date/Time   COLORURINE YELLOW 04/13/2016 1714   APPEARANCEUR CLOUDY (A) 04/13/2016 1714   LABSPEC 1.016 04/13/2016 1714   PHURINE 5.0 04/13/2016 1714   GLUCOSEU NEGATIVE 04/13/2016 1714   GLUCOSEU NEGATIVE 05/02/2015 1008   HGBUR NEGATIVE 04/13/2016 1714   BILIRUBINUR NEGATIVE 04/13/2016  1714   KETONESUR NEGATIVE 04/13/2016 1714   PROTEINUR 30 (A) 04/13/2016 1714   UROBILINOGEN 0.2 05/02/2015 1008   NITRITE NEGATIVE 04/13/2016 1714   LEUKOCYTESUR TRACE (A) 04/13/2016 1714   Sepsis Labs: '@LABRCNTIP'$ (procalcitonin:4,lacticidven:4) )No results found for this or any previous visit (from the past 240 hour(s)).   Scheduled Meds: . aspirin  325 mg Oral Daily  . carvedilol  3.125 mg Oral BID WC  . clotrimazole  10 mg Oral 5 X Daily  . feeding supplement (ENSURE ENLIVE)  237 mL Oral BID PC  . ferrous gluconate  324 mg Oral Q2200  . ferumoxytol  510 mg Intravenous Weekly  . fluticasone  2 spray Each Nare Daily  . guaiFENesin  1,200 mg Oral BID  . heparin  5,000 Units Subcutaneous Q8H  . multivitamin with minerals  1 tablet Oral Daily  . polyethylene glycol  17 g Oral Daily  . sodium bicarbonate  1,300 mg Oral BID  . sodium chloride flush  3 mL Intravenous Q12H   Continuous Infusions:  Procedures/Studies: Dg Chest 2 View  Result Date: 04/06/2016 CLINICAL DATA:  Shortness of breath. Sore back radiating to both flanks. Discharge from [facility] on Saturday with diagnosis of pneumonia. Continued fever and chills. EXAM: CHEST  2 VIEW COMPARISON:  04/01/2016 FINDINGS: Shallow inspiration with linear atelectasis in the lung bases. Cardiac enlargement. Diffuse airspace opacities in the lungs consistent with edema or multifocal pneumonia. Similar appearance to previous study. No blunting of costophrenic angles. No pneumothorax. Calcified aorta. Thoracic scoliosis convex towards the right. IMPRESSION: Cardiac enlargement. Diffuse airspace disease suggesting edema or pneumonia. Electronically Signed   By: Lucienne Capers M.D.   On: 04/06/2016 23:55   Dg Chest 2 View  Result Date: 04/01/2016 CLINICAL DATA:  Acute onset of fever, cough and body aches. Initial encounter. EXAM: CHEST  2 VIEW COMPARISON:  Chest radiograph performed 12/29/2015 FINDINGS: The lungs are well-aerated.  Vascular congestion is noted. Bilateral central airspace opacities may reflect pneumonia or pulmonary edema. No pleural effusion or pneumothorax is seen. The heart is mildly enlarged. No acute osseous abnormalities are seen. IMPRESSION: Vascular congestion and mild cardiomegaly. Bilateral central airspace opacities may reflect pneumonia or pulmonary edema. Electronically Signed   By: Garald Balding M.D.   On: 04/01/2016 03:08   Ct Chest Wo Contrast  Result Date: 04/01/2016 CLINICAL DATA:  Chest pain. Evaluate for aortic aneurysm. History of myeloproliferative disorder. EXAM: CT CHEST WITHOUT CONTRAST TECHNIQUE: Multidetector CT imaging of the chest was performed following the standard protocol without IV contrast. COMPARISON:  06/21/2014 FINDINGS: Cardiovascular: Ascending thoracic aorta measures up to 4.4 cm and stable. Atherosclerotic calcifications involving the posterior aortic arch. Proximal descending thoracic aorta measures 3.8 cm and stable. Mid descending thoracic aorta measures 3.4 cm and stable. Main pulmonary artery is markedly enlarged measuring 5 cm but stable. Mediastinum/Nodes: Small mediastinal lymph nodes have not significantly changed. There is  a 1 cm hypodense nodule or cyst in the right thyroid lobe. There is subcutaneous edema in the chest. No significant pericardial fluid. Lungs/Pleura: Trachea and mainstem bronchi are patent. Again noted is a mosaic attenuation pattern throughout the lungs. This pattern probably represents multiple areas of air trapping. These are prominent pulmonary vessels in the lung bases. Volume loss in the right lower lobe related to elevation of the right hemidiaphragm. There is some septal thickening in the upper lobes. No significant pleural effusions. Upper Abdomen: Again noted is an enlarged spleen which is only partially visualized. No acute abnormality in the upper abdomen. There is a high-density structure within the stomach lumen. Musculoskeletal: The  visualized bones are diffusely sclerotic which is similar to the previous examination. There are small scattered lucent areas throughout the bones. Some of these lucent areas/lesions have enlarged from the prior examination. Lucent lesion along the posterior left sixth rib now measures 1.1 cm and previously measured 0.7 cm. No evidence for pathologic fracture. 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 Recommend annual imaging followup by CTA or MRA. This recommendation follows 2010 ACCF/AHA/AATS/ACR/ASA/SCA/SCAI/SIR/STS/SVM Guidelines for the Diagnosis and Management of Patients with Thoracic Aortic Disease. Circulation. 2010; 121: G644-I347 Stable enlargement of pulmonary arteries compatible with pulmonary hypertension. Mosaic attenuation pattern throughout both lungs. Similar finding was present on the previous examination. This is probably related to a combination of air trapping and mild edema. Bones are diffusely sclerotic with enlarged lucent lesions. 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 Splenomegaly. This is probably related to the history of myeloproliferative disorder. Electronically Signed   By: Markus Daft M.D.   On: 04/01/2016 11:14   US Renal  Result Date: 04/13/2016 CLINICAL DATA:  Acute on chronic renal failure EXAM: RENAL / URINARY TRACT ULTRASOUND COMPLETE COMPARISON:  04/03/2014 FINDINGS: Right Kidney: Length: 11.1 cm. Increased echotexture. 2.8 cm lower pole cyst. Mild right hydronephrosis. Left Kidney: Length: 12.7 cm. Increased echotexture. There is severe left hydronephrosis. 1.4 cm echogenic, shadowing area in the lower pole, likely nonobstructing left renal stone. Bladder: Multiple large cystic areas within the pelvis which appear to be contiguous with the bladder compatible with diverticula and trabeculations, stable. IMPRESSION: Mild right hydronephrosis and severe left hydronephrosis. Diverticula noted within the bladder  with trabeculations, similar prior study. Probable 14 mm nonobstructing left lower pole renal stone. Increased echotexture in the kidneys compatible with chronic medical renal disease. Electronically Signed   By: Rolm Baptise M.D.   On: 04/13/2016 13:01   Dg Chest Port 1 View  Result Date: 04/10/2016 CLINICAL DATA:  Acute respiratory failure with hypoxia. Pulmonary edema versus other infectious or leukemic infiltrates. EXAM: PORTABLE CHEST 1 VIEW COMPARISON:  Chest x-rays dated 04/06/2016, 04/01/2016, 12/29/2015 and 01/21/2015. FINDINGS: No significant change compared to the most recent chest x-ray of 04/06/2016, with persistent bilateral perihilar and bibasilar opacities compatible with either pulmonary edema or multifocal pneumonia. No new lung findings. No pleural effusions seen. Heart size and mediastinal contours are stable. Atherosclerotic changes again noted at the aortic arch. Osseous structures appear mottled, particularly about each shoulder, compatible with the mixed sclerotic and lucent lesions described on earlier chest CT of 04/01/2016. IMPRESSION: 1. Persistent perihilar and bibasilar airspace opacities, unchanged compared to the most recent chest x-ray of 04/06/2016, compatible with either pulmonary edema or multifocal pneumonia, favor pulmonary edema. No new lung findings. 2. Mottled appearance of the osseous structures, particularly prominent about each shoulder, compatible with the mixed sclerotic and  lucent lesions described on chest CT of 04/01/2016, highly suspicious for metastatic disease. 3. Aortic atherosclerosis. Electronically Signed   By: Franki Cabot M.D.   On: 04/10/2016 19:06    Rachael Ferrie, DO  [facility] Pager [phone]  If 7PM-7AM, please contact night-coverage [email or web address] Password [facility] 04/14/2016, 6:36 PM   LOS: 6 days

## 2016-04-14 NOTE — Clinical Social Work Note (Signed)
CSW notified [name] admissions coordinator that patient has accepted their bed offer. Patient is still agreeable. Per MD this morning in progression, waiting on nephrology to clear him. SNF aware.  Juan [name], [place]

## 2016-04-14 NOTE — Progress Notes (Signed)
Nutrition Follow-up   INTERVENTION:  Continue Ensure Enlive po BID, each supplement provides 350 kcal and 20 grams of protein Provide Magic Cup ice cream with meal trays, each supplement provides 290 kcal and 9 grams of protein   NUTRITION DIAGNOSIS:   Increased nutrient needs related to chronic illness, wound healing as evidenced by estimated needs, moderate depletions of muscle mass.  ongoing  GOAL:   Patient will meet greater than or equal to 90% of their needs  unmet  MONITOR:   PO intake, Supplement acceptance, Labs, Weight trends, Skin, I & O's  REASON FOR ASSESSMENT:   Malnutrition Screening Tool    ASSESSMENT:   75 year old male with a history of hypertension, heart failure, CAD, diabetes who presented with complaints of worsening shortness of breath and increased pain all over. Patient was recently admitted for community-acquired pneumonia from 04/01/2016 to 04/04/2016. He was discharged with a prescription for Levaquin however did not obtain this prescription as he could not afford it.   Per nursing notes, pt is eating 0-25% of meals. His weight is down 9 lbs from one week ago. Pt lethargic at time of visit. He had 3 bottles of Ensure Enlive in his room. States that he is drinking some of the Ensure supplements and asked RD to hold cup for him so e could drink some at time of visit. He drank ~ 3 ounces and immediately started to complain of abdominal pain due to constipation. RD discussed tips for constipation and pt requested prune juice; he drank a few ounces and seemed comfortable. RD encouraged pt to continue drinking supplements and try each food item on meal trays.  Labs: low hemoglobin, high BUN, elevated creatinine  Diet Order:  Diet Heart Room service appropriate? Yes; Fluid consistency: Thin  Skin:  Wound (see comment) (Stage II PU on L buttocks; Stage I PI on sacrum)  Last BM:  11/26  Height:   Ht Readings from Last 1 Encounters:  04/07/16 '6\' 3"'$   (1.905 m)    Weight:   Wt Readings from Last 1 Encounters:  04/14/16 188 lb 9.6 oz (85.5 kg)    Ideal Body Weight:  89.09 kg  BMI:  Body mass index is 23.57 kg/m.  Estimated Nutritional Needs:   Kcal:  3748-2707  Protein:  110-120 grams  Fluid:  per MD  EDUCATION NEEDS:   No education needs identified at this time  [place], CSP, LDN Inpatient Clinical Dietitian Pager: [phone] After Hours Pager: [phone]

## 2016-04-14 NOTE — Progress Notes (Signed)
Daily Progress Note   Patient Name: Juan [name]       Date: 04/14/2016 DOB: 1940-07-17  Age: 75 y.o. MRN#: [id number] Attending Physician: Orson Eva, MD Primary Care Physician: [name], MD Admit Date: 04/06/2016  Reason for Consultation/Follow-up: Establishing goals of care  Subjective: Resting in bed, states that he is trying to have a bowel movement, he does not think he has had a bowel movement in a few days now. Discussed with patient about starting a bowel regimen, he is agreeable.  Patient also asking for water, discussed with bedside RN.       No family at bedside.  Nephrology notes from 11-27 reviewed.   Length of Stay: 6  Current Medications: Scheduled Meds:  . aspirin  325 mg Oral Daily  . carvedilol  3.125 mg Oral BID WC  . clotrimazole  10 mg Oral 5 X Daily  . feeding supplement (ENSURE ENLIVE)  237 mL Oral BID PC  . ferrous gluconate  324 mg Oral Q2200  . fluticasone  2 spray Each Nare Daily  . guaiFENesin  1,200 mg Oral BID  . heparin  5,000 Units Subcutaneous Q8H  . multivitamin with minerals  1 tablet Oral Daily  . polyethylene glycol  17 g Oral Daily  . sodium bicarbonate  1,300 mg Oral BID  . sodium chloride flush  3 mL Intravenous Q12H    Continuous Infusions:   PRN Meds: sodium chloride, acetaminophen, alum & mag hydroxide-simeth, bisacodyl, calcium carbonate, diclofenac sodium, gabapentin, HYDROcodone-acetaminophen, ipratropium-albuterol, MUSCLE RUB, ondansetron (ZOFRAN) IV, simethicone, sodium chloride flush  Physical Exam   General: resting in bed, chronically ill-appearing.  HEENT: No bruits, no goiter, no JVD Heart: Regular rate and rhythm. No murmur appreciated. Lungs: Good air movement, faint rales in bases Abdomen: Soft, nontender,  nondistended, positive bowel sounds.  Ext: 1+ edema Skin: Warm and dry Neuro: Grossly intact, nonfocal.        Vital Signs: BP (!) 108/52 (BP Location: Left Arm)   Pulse 80   Temp 98.6 F (37 C) (Oral)   Resp 16   Ht 6' 3" (1.905 m)   Wt 85.5 kg (188 lb 9.6 oz) Comment: bedscale   SpO2 99%   BMI 23.57 kg/m  SpO2: SpO2: 99 % O2 Device: O2 Device: Nasal Cannula O2 Flow Rate: O2 Flow  Rate (L/min): 2 L/min  Intake/output summary:   Intake/Output Summary (Last 24 hours) at 04/14/16 0959 Last data filed at 04/14/16 6794  Gross per 24 hour  Intake              630 ml  Output             2000 ml  Net            -1370 ml   LBM: Last BM Date: 04/12/16 Baseline Weight: Weight: 93 kg (205 lb) Most recent weight: Weight: 85.5 kg (188 lb 9.6 oz) (bedscale )       Palliative Assessment/Data:    Flowsheet Rows   Flowsheet Row Most Recent Value  Intake Tab  Referral Department  Hospitalist  Unit at Time of Referral  Cardiac/Telemetry Unit  Palliative Care Primary Diagnosis  Cardiac  Date Notified  04/10/16  Palliative Care Type  Return patient Palliative Care  Reason for referral  Clarify Goals of Care  Date of Admission  04/06/16  Date first seen by Palliative Care  04/11/16  # of days Palliative referral response time  1 Day(s)  # of days IP prior to Palliative referral  4  Clinical Assessment  Psychosocial & Spiritual Assessment  Palliative Care Outcomes      Patient Active Problem List   Diagnosis Date Noted  . Systolic and diastolic CHF, acute on chronic (HCC) 04/08/2016  . Acute respiratory failure with hypoxia (HCC) 04/08/2016  . CHF exacerbation (HCC) 04/07/2016  . Acute renal failure superimposed on chronic kidney disease (HCC) 04/07/2016  . Thrombocytosis (HCC) 04/07/2016  . Anemia 04/07/2016  . Community acquired pneumonia 04/01/2016  . Pneumonia 04/01/2016  . Pressure injury of skin 04/01/2016  . Pressure ulcer stage II 12/31/2015  . UTI (urinary tract  infection) 12/30/2015  . Sepsis (HCC) 12/29/2015  . UTI (lower urinary tract infection) 12/29/2015  . T wave inversion in EKG 12/29/2015  . Goals of care, counseling/discussion 05/02/2015  . End-stage systolic heart failure, acute on chronic (HCC) 02/14/2015  . CKD (chronic kidney disease), stage III 01/22/2015  . Pain in limb   . Peripheral edema   . Unable to ambulate   . Thoracic aortic aneurysm (HCC) 04/26/2014  . CKD (chronic kidney disease) stage 3, GFR 30-59 ml/min 04/26/2014  . 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 Chest pain 04/15/2014  . Palliative care encounter 04/06/2014  . Weakness generalized 04/06/2014  . Venous stasis dermatitis of both lower extremities   . Malnutrition of moderate degree (HCC) 03/31/2014  . Morbid obesity due to excess calories (HCC) 03/29/2014  . 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 Leukocytosis 10/17/2013  . Poor compliance with advise  05/05/2012  . Annual physical exam 01/14/2012  . Weight loss 01/14/2012  . BPH (benign prostatic hyperplasia) 01/14/2012  . *Chronic lower extremity edema, stasis dermatitis 06/09/2011  . CARDIOMEGALY 04/29/2009  . *Prediabetes 09/07/2006  . Symptomatic anemia 09/07/2006  . Myeloproliferative disease (HCC) 09/07/2006  . Obstructive sleep apnea 09/07/2006  . Essential hypertension 09/07/2006  . Coronary atherosclerosis 09/07/2006  . *CHF-- PCP comments 09/07/2006  . ALLERGIC RHINITIS 09/07/2006    Palliative Care Assessment & Plan   Patient Profile: 75 y.o. male  with past medical history of HTN, diastolic CHF, CAD, AAA, DM type II, myeloproliferative disorder, and hospitalization from 11/15 to 11/18 for community-acquired pneumonia (discharged with plan for Levaquin which he did not get due to not being able to afford medication) admitted on 04/06/2016  with heart failure, pulmonary infiltrates, ARF, thrombocytosis, and myeloproliferative disorder.  He was reevaluated by  hematology 11/25. He has been declining bone marrow biopsy since initial discovery of disease. Plan for flow cytometry on 11/27. Palliative consulted for goals of care.  Recommendations/Plan:  Add bowel regimen and monitor: Miralax scheduled PO, also started on PRN rectal Dulcolax.     He would benefit from chaplain follow-up.     He wants to talk again with oncology following results of flow cytometry.  He states today he is not going to make any further decisions until results are back and he meets with oncology.  Will plan to follow up after this if he is still inpatient.  If he discharges, he would be well served by palliative follow-up as OP.  Our team would also be happy to meet in conjunction with oncology if this would be helpful.   Goals of Care and Additional Recommendations:  Limitations on Scope of Treatment: No bone marrow biopsy. Desires other aggressive measures in the event of decompensation.  Code Status:    Code Status Orders        Start     Ordered   04/07/16 0307  Full code  Continuous     04/07/16 0308    Code Status History    Date Active Date Inactive Code Status Order ID Comments User Context   04/01/2016  7:47 AM 04/04/2016  4:03 PM Full Code [id number]  Rise Patience, MD Inpatient   12/29/2015  3:35 PM 01/04/2016  5:34 AM Full Code [id number]  Willia Craze, NP ED   01/21/2015 12:02 PM 01/29/2015  4:18 PM Full Code [id number]  Melton Alar, PA-C Inpatient   06/17/2014  2:59 AM 06/25/2014  5:25 PM Full Code [id number]  Theressa Millard, MD Inpatient   04/15/2014  3:34 AM 04/16/2014  5:09 PM Full Code [id number]  Theressa Millard, MD Inpatient   03/29/2014  9:36 PM 04/10/2014  8:27 PM Full Code [id number]  Geradine Girt, DO Inpatient   01/25/2014  3:45 PM 01/27/2014  2:59 PM Full Code [id number]  Cristal Ford, DO Inpatient   07/11/2013  5:58 PM 07/14/2013  5:59 PM Full Code [id number]  Annita Brod, MD Inpatient       Prognosis:   Unable to  determine  Overall, his functional status remains poor and has decreased over the past several weeks to months.  He is at hight risk for continued decompensation and death.  I think that his prognosis, particularly if he does not have disease modifying therapy (has been declining bone marrow biopsy), is likely < 6 months if his disease follows its natural course.  We did not discuss hospice, but he ought to qualify for these services if so desired.  He wants to wait for results of flow cytometry prior to having further discussion.Marland Kitchen  Discharge Planning:  To Be Determined  Care plan was discussed with patient and bedside RN.   Thank you for allowing the Palliative Medicine Team to assist in the care of this patient.   Time In: 0930 Time Out: 1000 Total Time 30 Prolonged Time Billed No      Greater than 50%  of this time was spent counseling and coordinating care related to the above assessment and plan.  Loistine Chance, MD [phone] Please contact Palliative Medicine Team phone at [phone] for questions and concerns.

## 2016-04-14 NOTE — Progress Notes (Signed)
Pt c/o feeling hot and pain, asked for covers to be removed, covers removed per request and medication given, asked patient if I could reposition him and he did not respond with an answer, will continue to monitor.

## 2016-04-15 ENCOUNTER — Inpatient Hospital Stay (HOSPITAL_COMMUNITY): Payer: Medicare Other

## 2016-04-15 DIAGNOSIS — N1330 Unspecified hydronephrosis: Secondary | ICD-10-CM

## 2016-04-15 LAB — URIC ACID: URIC ACID, SERUM: 19.4 mg/dL — AB (ref 4.4–7.6)

## 2016-04-15 LAB — BASIC METABOLIC PANEL
BUN: 102 mg/dL — AB (ref 4–21)
Creatinine: 2.2 mg/dL — AB (ref 0.6–1.3)
GLUCOSE: 111 mg/dL
POTASSIUM: 3.7 mmol/L (ref 3.4–5.3)
SODIUM: 140 mmol/L (ref 137–147)

## 2016-04-15 LAB — CBC AND DIFFERENTIAL
HEMATOCRIT: 25 % — AB (ref 41–53)
Hemoglobin: 7.9 g/dL — AB (ref 13.5–17.5)
Platelets: 565 10*3/uL — AB (ref 150–399)
WBC: 22.5 10^3/mL

## 2016-04-15 LAB — CBC WITH DIFFERENTIAL/PLATELET
BAND NEUTROPHILS: 0 %
BASOS ABS: 0.5 10*3/uL — AB (ref 0.0–0.1)
BLASTS: 0 %
Basophils Relative: 2 %
Eosinophils Absolute: 0.7 10*3/uL (ref 0.0–0.7)
Eosinophils Relative: 3 %
HEMATOCRIT: 24.7 % — AB (ref 39.0–52.0)
HEMOGLOBIN: 7.9 g/dL — AB (ref 13.0–17.0)
LYMPHS PCT: 22 %
Lymphs Abs: 5 10*3/uL — ABNORMAL HIGH (ref 0.7–4.0)
MCH: 25.1 pg — ABNORMAL LOW (ref 26.0–34.0)
MCHC: 32 g/dL (ref 30.0–36.0)
MCV: 78.4 fL (ref 78.0–100.0)
Metamyelocytes Relative: 0 %
Monocytes Absolute: 1.4 10*3/uL — ABNORMAL HIGH (ref 0.1–1.0)
Monocytes Relative: 6 %
Myelocytes: 0 %
NEUTROS ABS: 14.9 10*3/uL — AB (ref 1.7–7.7)
NEUTROS PCT: 67 %
NRBC: 0 /100{WBCs}
PROMYELOCYTES ABS: 0 %
Platelets: 565 10*3/uL — ABNORMAL HIGH (ref 150–400)
RBC: 3.15 MIL/uL — ABNORMAL LOW (ref 4.22–5.81)
RDW: 19.4 % — ABNORMAL HIGH (ref 11.5–15.5)
WBC: 22.5 10*3/uL — ABNORMAL HIGH (ref 4.0–10.5)

## 2016-04-15 LAB — RENAL FUNCTION PANEL
ALBUMIN: 2.1 g/dL — AB (ref 3.5–5.0)
ANION GAP: 11 (ref 5–15)
BUN: 102 mg/dL — ABNORMAL HIGH (ref 6–20)
CALCIUM: 9.4 mg/dL (ref 8.9–10.3)
CO2: 25 mmol/L (ref 22–32)
Chloride: 104 mmol/L (ref 101–111)
Creatinine, Ser: 2.2 mg/dL — ABNORMAL HIGH (ref 0.61–1.24)
GFR, EST AFRICAN AMERICAN: 32 mL/min — AB (ref >60)
GFR, EST NON AFRICAN AMERICAN: 28 mL/min — AB (ref >60)
Glucose, Bld: 111 mg/dL — ABNORMAL HIGH (ref 65–99)
PHOSPHORUS: 4.3 mg/dL (ref 2.5–4.6)
Potassium: 3.7 mmol/L (ref 3.5–5.1)
SODIUM: 140 mmol/L (ref 135–145)

## 2016-04-15 LAB — HCV COMMENT:

## 2016-04-15 LAB — PARATHYROID HORMONE, INTACT (NO CA): PTH: 121 pg/mL — ABNORMAL HIGH (ref 15–65)

## 2016-04-15 LAB — URINE CULTURE: Culture: NO GROWTH

## 2016-04-15 LAB — HEPATITIS C ANTIBODY (REFLEX): HCV Ab: 0.2 s/co ratio (ref 0.0–0.9)

## 2016-04-15 MED ORDER — SODIUM BICARBONATE 650 MG PO TABS
650.0000 mg | ORAL_TABLET | Freq: Two times a day (BID) | ORAL | Status: DC
  Administered 2016-04-15 – 2016-04-18 (×6): 650 mg via ORAL
  Filled 2016-04-15 (×6): qty 1

## 2016-04-15 MED ORDER — DARBEPOETIN ALFA 100 MCG/0.5ML IJ SOSY
100.0000 ug | PREFILLED_SYRINGE | INTRAMUSCULAR | Status: DC
  Filled 2016-04-15: qty 0.5

## 2016-04-15 NOTE — Progress Notes (Signed)
PROGRESS NOTE  Juan [name] ZJQ:[id number] DOB: Mar 10, 1941 DOA: 04/06/2016 PCP: [name], MD  Brief History: 75 year old African-American male with a past medical history of coronary artery disease, diabetes, diastolic and systolic CHF, CAD, presented with complaints of SOB, weakness, cough and pain all over. Patient was just hospitalized from 11/15 - 11/18 for community-acquired pneumonia and treated with Levaquin. Patient was discharged home on Levaquin, but notes that he is on a fixed income and couldn't afford to fillthe prescription. Chest x-ray at the time of admission showed basilar atelectasis with diffuse airspace opacities concerning for pulmonary edema. The patient was started on intravenous furosemide with good clinical results. Cardiology was consulted and ultimately felt patient was euvolemic. Therefore his diuretics were discontinued, and cardiology signed off.Given the patient's overall medical condition, they recommended stopping diuretics altogether and using lasix prn only. Palliative medicine was consulted but, patient not willing to engage in conversation regarding long-term goals nor is he willing to have his family as part of any discussions regarding his care.  He continues to have very poor insight into his medical condition and continues to refuse to engage in any type of conversation regarding his medical care. During his hospital stay, the patient has developed acute on chronic renal failure for which nephrology was consulted. Renal ultrasound showed obstructive uropathy. Urology has been consulted to assist with management.   Assessment/Plan: Acute on chronic systolic and diastolic CHF -24/01/7352 echo EF 55-60%, grade 1 DD, PASP 33, mild RV dilatation -restarted lasix 40 mg IV BID initially -Admission chest x-ray shows pulmonary vascular congestion and pulmonary edema -daily weights--dry weight ~194 -04/14/16 weight 188--patient has refused to be weighed  since that period of time -appreciate cardiology consult-->furosemide, hydralazine, imdur stopped 11/24--cardiology signed off -d/c hydralazine and imdur due to soft BPs - Clinically appears euvolemic. When necessary Lasix.  Pulmonary infiltrates -doubt if his clinical syndrome was consistent with pneumonia -The patient was discharged on 04/04/2016 with instructionsto finish 5 more days levoflox -d/c levoflox after 11/25dose--completed 5 days during this inpatient stay -procalcitonin 0.43-->3.16-->2.68 -04/10/16 repeat CXR--stable infiltrates -stable on 2 L [place]  SVT -pt having 1-2 seconds of SVT, asymptomatic--2 episodes on 11/26 -may increase BB if BP is able to tolerate if episodes more frequent or longer in duration - Having transient intermittent SVT, asymptomatic  Acute on chronic renal failure--CKD 3 -Baseline creatinine 1.4-1.8 -Monitor with diuresis -serum creatinine peak 3.10 -Likely secondary to hemodynamic changes due to his soft blood pressures and a degree of volume depletion and poor oral intake and obstructive uropathy -appreciate nephrology -renal US--mild R, severe L-hydronephrosis -not a HD candidate - Creatinine has improved to 2.2. Appears to be from obstruction. This is improved after Foley catheter to alleviate bladder outlet obstruction. Follow BMP.  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 He had 3% blasts in the peripheral blood, which was  confirmed by flow cytometry -recommended Jakafi at that time, but pt refused BM bx and was lost to follow up at [facility] -04/07/16 diff with increase WBC precursors -consulted hematology-- Dr. Melissa Montane flow cytometry-->3% myeloblastic cells -iron sat 4 %, ferritin 417, B12--1372, folate  56.7 -He is afebrile and hemodynamically stable - As per hematology consultation 11/29: Patient's clinical picture is consistent with essential thrombocytosis/myeloproliferative syndrome. There is no evidence of transformation to acute leukemia. He has been on anagrelide in the past with good platelet control and this should be resumed at discharge. Follow-up with Dr.Ennever will be arranged in February.  Anemia - Status post IV iron and nephrology will provide ESA  Diabetes mellitus type 2, controlled -12/26/2015 and hemoglobin A1c 6.1 -Not on any oral agents outpt  Hyperkalemia -kayexalate given on 11/25 and 11/26. Resolved.  GOC -remains full code--discussed with patient -04/12/16--he allowed me to speak with his daughter--she was updated and will try to speak with patient- -04/10/16--consulted palliative medicine--ptnot willing to engage in conversation regarding long-term goals nor is he willing to have his family as part of any discussions regarding his care. -He continues to have very poor insight into his medical condition and continues to refuse to engage in any type of conversation regarding his medical care. -11/27--case discussed with Dr. Domingo Cocking  Right wrist pain - Unclear etiology. Denies history of gout. No trauma reported. Except painful range of movements, no acute findings noted. Check x-ray of right wrist to rule out fracture (low index of suspicion). Check uric acid but no findings suggestive of acute gout. Symptomatic treatment. - X-rays have shown mottled appearance of osseus structures compatible with mixed sclerotic and lucent lesions of unclear etiology?  Metastatic disease.? Bone scan   Disposition Plan: SNF when cleared by consultants Family Communication: None at bedside   Consultants: Nephrology, Urology, Hematology/Onc,   Code Status: FULL   DVT Prophylaxis: Posey Heparin    Procedures: As Listed in Progress Note Above  Antibiotics: Levofloxacin 11/20>>>11/25     Subjective: States that after blood tests, developed complete right upper extremity pain. Although after investigating his history further, it appears that his pain is mostly from right wrist only. No trauma reported. Denies history of gout. Denies any other complaints.  Objective: Vitals:   04/14/16 1148 04/14/16 2130 04/15/16 0549 04/15/16 1200  BP: (!) 102/54 (!) 112/52 (!) 105/55 (!) 103/48  Pulse: 84 85 83 72  Resp: '18 18 18 18  '$ Temp: 98.4 F (36.9 C) 99 F (37.2 C) 98.2 F (36.8 C) 98.4 F (36.9 C)  TempSrc: Oral Oral Oral Oral  SpO2: 97% 94% 94% 95%  Weight:   86 kg (189 lb 8 oz)   Height:        Intake/Output Summary (Last 24 hours) at 04/15/16 1732 Last data filed at 04/15/16 1556  Gross per 24 hour  Intake              483 ml  Output             1250 ml  Net             -767 ml   Weight change: 0.408 kg (14.4 oz) Exam:   General:  Pt is alert, follows commands appropriately, not in acute distress. Pleasant elderly male, chronically ill-looking and lying comfortably propped up in bed.  HEENT: No icterus, No thrush, No neck mass, [place]/AT  Cardiovascular: RRR, S1/S2, no rubs, no gallops. Telemetry: Sinus rhythm. Occasional NSSVT.  Respiratory:Bibasilar crackles but no wheezing. Good air movement. Rest of lung fields clear to auscultation. No increased work of breathing.  Abdomen: Soft/+BS, non tender, non distended, no guarding  Extremities: 1 + LE edema, No lymphangitis, No petechiae, No rashes, no synovitis   Data Reviewed: I have  personally reviewed following labs and imaging studies Basic Metabolic Panel:  Recent  Labs Lab 04/11/16 0336 04/12/16 0541 04/13/16 0521 04/14/16 0350 04/15/16 0356  NA 134* 135 134* 135 140  K 5.4* 5.0 4.1 3.4* 3.7  CL 103 100* 98* 101 104  CO2 19* 20* 21* 23 25  GLUCOSE 115* 108* 108* 104* 111*  BUN 80* 89* 104* 106* 102*  CREATININE 2.34* 2.55* 3.10* 2.76* 2.20*  CALCIUM 9.4 9.6 9.2 8.9 9.4  PHOS  --   --   --  4.5 4.3   Liver Function Tests:  Recent Labs Lab 04/14/16 0350 04/15/16 0356  AST 25  --   ALT 12*  --   ALKPHOS 82  --   BILITOT 0.7  --   PROT 5.1*  --   ALBUMIN 2.1* 2.1*   No results for input(s): LIPASE, AMYLASE in the last 168 hours. No results for input(s): AMMONIA in the last 168 hours. Coagulation Profile: No results for input(s): INR, PROTIME in the last 168 hours. CBC:  Recent Labs Lab 04/10/16 0528 04/11/16 0336 04/13/16 0521 04/14/16 0350 04/15/16 0356  WBC 14.3* 15.0* 18.7* 18.4* 22.5*  NEUTROABS 9.2* 9.5*  --   --  14.9*  HGB 8.7* 8.9* 8.4* 7.4* 7.9*  HCT 26.5* 27.2* 26.2* 22.9* 24.7*  MCV 77.7* 76.8* 77.1* 76.6* 78.4  PLT 575* 498* 571* 491* 565*   Cardiac Enzymes: No results for input(s): CKTOTAL, CKMB, CKMBINDEX, TROPONINI in the last 168 hours. BNP: Invalid input(s): POCBNP CBG: No results for input(s): GLUCAP in the last 168 hours. HbA1C: No results for input(s): HGBA1C in the last 72 hours. Urine analysis:    Component Value Date/Time   COLORURINE YELLOW 04/13/2016 1714   APPEARANCEUR CLOUDY (A) 04/13/2016 1714   LABSPEC 1.016 04/13/2016 1714   PHURINE 5.0 04/13/2016 1714   GLUCOSEU NEGATIVE 04/13/2016 1714   GLUCOSEU NEGATIVE 05/02/2015 1008   HGBUR NEGATIVE 04/13/2016 1714   BILIRUBINUR NEGATIVE 04/13/2016 1714   KETONESUR NEGATIVE 04/13/2016 1714   PROTEINUR 30 (A) 04/13/2016 1714   UROBILINOGEN 0.2 05/02/2015 1008   NITRITE NEGATIVE 04/13/2016 1714   LEUKOCYTESUR TRACE (A) 04/13/2016 1714    Recent Results (from the past 240 hour(s))  Urine culture     Status: None   Collection Time:  04/14/16  2:39 PM  Result Value Ref Range Status   Specimen Description URINE, CATHETERIZED  Final   Special Requests NONE  Final   Culture NO GROWTH  Final   Report Status 04/15/2016 FINAL  Final     Scheduled Meds: . aspirin  325 mg Oral Daily  . carvedilol  3.125 mg Oral BID WC  . clotrimazole  10 mg Oral 5 X Daily  . darbepoetin ([name]) injection - NON-DIALYSIS  100 mcg Subcutaneous Q Wed-1800  . feeding supplement (ENSURE ENLIVE)  237 mL Oral BID PC  . ferrous gluconate  324 mg Oral Q2200  . ferumoxytol  510 mg Intravenous Weekly  . fluticasone  2 spray Each Nare Daily  . guaiFENesin  1,200 mg Oral BID  . heparin  5,000 Units Subcutaneous Q8H  . multivitamin with minerals  1 tablet Oral Daily  . polyethylene glycol  17 g Oral Daily  . sodium bicarbonate  650 mg Oral BID  . sodium chloride flush  3 mL Intravenous Q12H   Continuous Infusions:  Procedures/Studies: Ct Abdomen Pelvis Wo Contrast  Result Date: 04/15/2016 CLINICAL DATA:  Evaluate hydronephrosis. EXAM: CT ABDOMEN AND PELVIS WITHOUT CONTRAST TECHNIQUE: Multidetector  CT imaging of the abdomen and pelvis was performed following the standard protocol without IV contrast. COMPARISON:  None. FINDINGS: Lower chest: There is subsegmental atelectasis within the lung bases. There is also a mosaic attenuation pattern within the visualized portions of the lungs. No pleural effusion noted. Hepatobiliary: No focal liver abnormality is seen. No gallstones, gallbladder wall thickening, or biliary dilatation.There is subsegmental atelectasis noted in the lung bases. Pancreas: Unremarkable. No pancreatic ductal dilatation or surrounding inflammatory changes. Spleen: The spleen is enlarged measuring 15.4 cm in length. Adrenals/Urinary Tract: Normal appearance of the adrenal glands. Cyst within the inferior pole the right kidney is incompletely characterized without IV contrast measuring 3 cm. No renal calculi identified. Very mild  bilateral pelvocaliectasis identified. No evidence for high-grade obstructive uropathy. The urinary bladder is collapsed around a Foley catheter. Stomach/Bowel: The stomach is normal. The small bowel loops have a normal course and caliber. Numerous colonic diverticula identified. No evidence for acute diverticulitis. Moderate stool burden is noted within the rectum. Vascular/Lymphatic: Aortic atherosclerosis. No adenopathy identified within the abdomen or the pelvis. Reproductive: Prostate is unremarkable. Other: No free fluid or fluid collections. Musculoskeletal: The bones are diffusely sclerotic compatible with widespread osseous metastatic disease. IMPRESSION: 1. Interval decompression of bilateral renal collecting systems with mild residual pelvocaliectasis status post catheter placement. At this time there is no evidence for high-grade obstructive uropathy of either kidney. 2. Splenomegaly. 3. Diffuse sclerotic bone metastases. 4. Aortic atherosclerosis. Electronically Signed   By: Kerby Moors M.D.   On: 04/15/2016 10:25   Dg Chest 2 View  Result Date: 04/06/2016 CLINICAL DATA:  Shortness of breath. Sore back radiating to both flanks. Discharge from [facility] on Saturday with diagnosis of pneumonia. Continued fever and chills. EXAM: CHEST  2 VIEW COMPARISON:  04/01/2016 FINDINGS: Shallow inspiration with linear atelectasis in the lung bases. Cardiac enlargement. Diffuse airspace opacities in the lungs consistent with edema or multifocal pneumonia. Similar appearance to previous study. No blunting of costophrenic angles. No pneumothorax. Calcified aorta. Thoracic scoliosis convex towards the right. IMPRESSION: Cardiac enlargement. Diffuse airspace disease suggesting edema or pneumonia. Electronically Signed   By: Lucienne Capers M.D.   On: 04/06/2016 23:55   Dg Chest 2 View  Result Date: 04/01/2016 CLINICAL DATA:  Acute onset of fever, cough and body aches. Initial encounter. EXAM: CHEST   2 VIEW COMPARISON:  Chest radiograph performed 12/29/2015 FINDINGS: The lungs are well-aerated. Vascular congestion is noted. Bilateral central airspace opacities may reflect pneumonia or pulmonary edema. No pleural effusion or pneumothorax is seen. The heart is mildly enlarged. No acute osseous abnormalities are seen. IMPRESSION: Vascular congestion and mild cardiomegaly. Bilateral central airspace opacities may reflect pneumonia or pulmonary edema. Electronically Signed   By: Garald Balding M.D.   On: 04/01/2016 03:08   Ct Chest Wo Contrast  Result Date: 04/01/2016 CLINICAL DATA:  Chest pain. Evaluate for aortic aneurysm. History of myeloproliferative disorder. EXAM: CT CHEST WITHOUT CONTRAST TECHNIQUE: Multidetector CT imaging of the chest was performed following the standard protocol without IV contrast. COMPARISON:  06/21/2014 FINDINGS: Cardiovascular: Ascending thoracic aorta measures up to 4.4 cm and stable. Atherosclerotic calcifications involving the posterior aortic arch. Proximal descending thoracic aorta measures 3.8 cm and stable. Mid descending thoracic aorta measures 3.4 cm and stable. Main pulmonary artery is markedly enlarged measuring 5 cm but stable. Mediastinum/Nodes: Small mediastinal lymph nodes have not significantly changed. There is a 1 cm hypodense nodule or cyst in the right thyroid lobe. There is subcutaneous edema  in the chest. No significant pericardial fluid. Lungs/Pleura: Trachea and mainstem bronchi are patent. Again noted is a mosaic attenuation pattern throughout the lungs. This pattern probably represents multiple areas of air trapping. These are prominent pulmonary vessels in the lung bases. Volume loss in the right lower lobe related to elevation of the right hemidiaphragm. There is some septal thickening in the upper lobes. No significant pleural effusions. Upper Abdomen: Again noted is an enlarged spleen which is only partially visualized. No acute abnormality in the  upper abdomen. There is a high-density structure within the stomach lumen. Musculoskeletal: The visualized bones are diffusely sclerotic which is similar to the previous examination. There are small scattered lucent areas throughout the bones. Some of these lucent areas/lesions have enlarged from the prior examination. Lucent lesion along the posterior left sixth rib now measures 1.1 cm and previously measured 0.7 cm. No evidence for pathologic fracture. 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 Recommend annual imaging followup by CTA or MRA. This recommendation follows 2010 ACCF/AHA/AATS/ACR/ASA/SCA/SCAI/SIR/STS/SVM Guidelines for the Diagnosis and Management of Patients with Thoracic Aortic Disease. Circulation. 2010; 121: Z610-R604 Stable enlargement of pulmonary arteries compatible with pulmonary hypertension. Mosaic attenuation pattern throughout both lungs. Similar finding was present on the previous examination. This is probably related to a combination of air trapping and mild edema. Bones are diffusely sclerotic with enlarged lucent lesions. 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 Splenomegaly. This is probably related to the history of myeloproliferative disorder. Electronically Signed   By: Markus Daft M.D.   On: 04/01/2016 11:14   US Renal  Result Date: 04/13/2016 CLINICAL DATA:  Acute on chronic renal failure EXAM: RENAL / URINARY TRACT ULTRASOUND COMPLETE COMPARISON:  04/03/2014 FINDINGS: Right Kidney: Length: 11.1 cm. Increased echotexture. 2.8 cm lower pole cyst. Mild right hydronephrosis. Left Kidney: Length: 12.7 cm. Increased echotexture. There is severe left hydronephrosis. 1.4 cm echogenic, shadowing area in the lower pole, likely nonobstructing left renal stone. Bladder: Multiple large cystic areas within the pelvis which appear to be contiguous with the bladder compatible with diverticula and trabeculations, stable. IMPRESSION:  Mild right hydronephrosis and severe left hydronephrosis. 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 Probable 14 mm nonobstructing left lower pole renal stone. Increased echotexture in the kidneys compatible with chronic medical renal disease. Electronically Signed   By: Rolm Baptise M.D.   On: 04/13/2016 13:01   Dg Chest Port 1 View  Result Date: 04/10/2016 CLINICAL DATA:  Acute respiratory failure with hypoxia. Pulmonary edema versus other infectious or leukemic infiltrates. EXAM: PORTABLE CHEST 1 VIEW COMPARISON:  Chest x-rays dated 04/06/2016, 04/01/2016, 12/29/2015 and 01/21/2015. FINDINGS: No significant change compared to the most recent chest x-ray of 04/06/2016, with persistent bilateral perihilar and bibasilar opacities compatible with either pulmonary edema or multifocal pneumonia. No new lung findings. No pleural effusions seen. Heart size and mediastinal contours are stable. Atherosclerotic changes again noted at the aortic arch. Osseous structures appear mottled, particularly about each shoulder, compatible with the mixed sclerotic and lucent lesions described on earlier chest CT of 04/01/2016. IMPRESSION: 1. Persistent perihilar and bibasilar airspace opacities, unchanged compared to the most recent chest x-ray of 04/06/2016, compatible with either pulmonary edema or multifocal pneumonia, favor pulmonary edema. No new lung findings. 2. Mottled appearance of the osseous structures, particularly prominent about each shoulder, compatible with the mixed sclerotic and lucent lesions described on chest CT of 04/01/2016, highly suspicious for metastatic disease. 3. Aortic atherosclerosis.  Electronically Signed   By: Franki Cabot M.D.   On: 04/10/2016 19:06    Rip Hawes, MD, FACP, FHM. [facility] Pager [phone]  If 7PM-7AM, please contact night-coverage [email or web address] Password [email or web address] 04/15/2016, 5:46 PM   LOS: 7 days

## 2016-04-15 NOTE — Care Management Note (Signed)
Case Management Note  Patient Details  Name: Juan [name] MRN: [id number] Date of Birth: August 18, 1940  Subjective/Objective: 75 y.o. M readmitted 04/07/2016 for CHF exacerbation after 3D hospitalization 11/15-11/18 for CAP. Pt reports he was unable to afford his Levaquin at discharge. Had been discharged with HHPT, RN and [place]. Currently awaiting discharge to SNF at [facility]. Disposition is deferred to CSW at this time.                    Action/Plan:CM will sign off for now but will be available should discharge/disposition needs arise.    Expected Discharge Date:                  Expected Discharge Plan:  Skilled Nursing Facility In-House Referral:  Clinical Social Work  Discharge planning Services  CM Consult  Post Acute Care Choice:  NA Choice offered to:  Patient  DME Arranged:    DME Agency:     HH Arranged:  NA HH Agency:  [place], [place]  Status of Service:  In process, will continue to follow  If discussed at Long Length of Stay Meetings, dates discussed:    Additional Comments:  Delrae Sawyers, RN 04/15/2016, 2:08 PM

## 2016-04-15 NOTE — Care Management Important Message (Signed)
Important Message  Patient Details  Name: Juan [name] MRN: [id number] Date of Birth: 12-30-1940   Medicare Important Message Given:  Yes    [name] May 04/15/2016, 10:39 AM

## 2016-04-15 NOTE — Progress Notes (Signed)
Juan [name]   DOB:1941-01-15   VX#:[id number]   QZR#:[id number]  Subjective: woke up with pain in the Right arm; "looking for good news." Has a poor understanding of his overall situation--for instance could not explain to me why he has a tube draining his bladder. This was reviewed. No family in room   Objective: older African American [name] examined in bed Vitals:   04/14/16 2130 04/15/16 0549  BP: (!) 112/52 (!) 105/55  Pulse: 85 83  Resp: 18 18  Temp: 99 F (37.2 C) 98.2 F (36.8 C)    Body mass index is 23.69 kg/m.  Intake/Output Summary (Last 24 hours) at 04/15/16 0742 Last data filed at 04/15/16 0550  Gross per 24 hour  Intake              126 ml  Output             1276 ml  Net            -1150 ml      CBG (last 3)  No results for input(s): GLUCAP in the last 72 hours.   Labs:  Lab Results  Component Value Date   WBC 22.5 (H) 04/15/2016   HGB 7.9 (L) 04/15/2016   HCT 24.7 (L) 04/15/2016   MCV 78.4 04/15/2016   PLT 565 (H) 04/15/2016   NEUTROABS 14.9 (H) 04/15/2016    '@LASTCHEMISTRY'$ @  Urine Studies No results for input(s): UHGB, CRYS in the last 72 hours.  Invalid input(s): UACOL, UAPR, USPG, UPH, UTP, UGL, UKET, UBIL, UNIT, UROB, [place], UEPI, UWBC, [place]wayne Heck [place], [place]  Basic Metabolic Panel:  Recent Labs Lab 04/11/16 0336 04/12/16 0541 04/13/16 0521 04/14/16 0350 04/15/16 0356  NA 134* 135 134* 135 140  K 5.4* 5.0 4.1 3.4* 3.7  CL 103 100* 98* 101 104  CO2 19* 20* 21* 23 25  GLUCOSE 115* 108* 108* 104* 111*  BUN 80* 89* 104* 106* 102*  CREATININE 2.34* 2.55* 3.10* 2.76* 2.20*  CALCIUM 9.4 9.6 9.2 8.9 9.4  PHOS  --   --   --  4.5 4.3   GFR Estimated Creatinine Clearance: 35.2 mL/min (by C-G formula based on SCr of 2.2 mg/dL (H)). Liver Function Tests:  Recent Labs Lab 04/14/16 0350 04/15/16 0356  AST 25  --   ALT 12*  --   ALKPHOS 82  --   BILITOT 0.7  --   PROT 5.1*  --   ALBUMIN 2.1* 2.1*   No results for input(s): LIPASE,  AMYLASE in the last 168 hours. No results for input(s): AMMONIA in the last 168 hours. Coagulation profile No results for input(s): INR, PROTIME in the last 168 hours.  CBC:  Recent Labs Lab 04/10/16 0528 04/11/16 0336 04/13/16 0521 04/14/16 0350 04/15/16 0356  WBC 14.3* 15.0* 18.7* 18.4* 22.5*  NEUTROABS 9.2* 9.5*  --   --  14.9*  HGB 8.7* 8.9* 8.4* 7.4* 7.9*  HCT 26.5* 27.2* 26.2* 22.9* 24.7*  MCV 77.7* 76.8* 77.1* 76.6* 78.4  PLT 575* 498* 571* 491* 565*   Cardiac Enzymes: No results for input(s): CKTOTAL, CKMB, CKMBINDEX, TROPONINI in the last 168 hours. BNP: Invalid input(s): POCBNP CBG: No results for input(s): GLUCAP in the last 168 hours. D-Dimer No results for input(s): DDIMER in the last 72 hours. Hgb A1c No results for input(s): HGBA1C in the last 72 hours. Lipid Profile No results for input(s): CHOL, HDL, LDLCALC, TRIG, CHOLHDL, LDLDIRECT in the last 72 hours. Thyroid function studies No  results for input(s): TSH, T4TOTAL, T3FREE, THYROIDAB in the last 72 hours.  Invalid input(s): FREET3 Anemia work up No results for input(s): VITAMINB12, FOLATE, FERRITIN, TIBC, IRON, RETICCTPCT in the last 72 hours. Microbiology No results found for this or any previous visit (from the past 240 hour(s)).    Studies:  US Renal  Result Date: 27-Apr-2016 CLINICAL DATA:  Acute on chronic renal failure EXAM: RENAL / URINARY TRACT ULTRASOUND COMPLETE COMPARISON:  04/03/2014 FINDINGS: Right Kidney: Length: 11.1 cm. Increased echotexture. 2.8 cm lower pole cyst. Mild right hydronephrosis. Left Kidney: Length: 12.7 cm. Increased echotexture. There is severe left hydronephrosis. 1.4 cm echogenic, shadowing area in the lower pole, likely nonobstructing left renal stone. Bladder: Multiple large cystic areas within the pelvis which appear to be contiguous with the bladder compatible with diverticula and trabeculations, stable. IMPRESSION: Mild right hydronephrosis and severe left  hydronephrosis. 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 Probable 14 mm nonobstructing left lower pole renal stone. Increased echotexture in the kidneys compatible with chronic medical renal disease. Electronically Signed   By: Rolm Baptise M.D.   On: 2016-04-27 13:01    Patient: Juan [name], Juan [name] Collected: 04-27-2016 Client: [place] Accession: [id number] Received: Apr 27, 2016 Juan Del, MD DOB: [birth year] Age: 75 Gender: M Reported: 04/14/2016 Juan [name] Patient Ph: [phone] MRN #: [id number] [place], [place] [address] Visit #: [id number] Chart #: Phone: [phone] Fax: [phone] CC: CYTOMETRY REPORT IERPRETATION Interpretation Peripheral Blood Flow Cytometry - 3% MYELOBLASTIC CELLS IDENTIFIED. Juan Greenhouse MD Pathologist, Electronic Signature (Case signed 04/14/2016)    Assessment: 75 y.o. 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, with multiple hematologic visits since documenting anemia, leukocytosis, and 1-3% blasts on smear, with repeatedly unremarkable B-12, folate and ferritin determinations, low reticulocyte count; refusing bone marrow biopsy  (1) Review of peripheral blood film today shows anisocytosis but I don't see significant tailed poikylocytes or NRNCs; rare blast, with left shift in the WBC series as previously noted; thrombocytosis  (2) flow cytometry shows 3% blasts-- this is unchanged c/w same test at [facility] July 2015  (a) 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 There is no evidence of transformation to an acute leukemia. He has been on anagrelide in the past with good platelet control and this should be resumed at discharge  He identifies Juan [name] as his hematologist. We will arrange for a follow-up visit with Juan [name] in February.  The  patient's anemia may be primarily due to renal insufficiency. He may be a candidate for epo at renal's discretion.  I am still unsure as to the cause for the patient's diffuselt sclerotic bones-- is this sufficiently explained by his PTH, renal status? Note repeat PSA is low.  Will sign off at this point. Please reconsult as needed     Chauncey Cruel, MD 04/15/2016  7:42 AM Medical Oncology and Hematology [facility] [address] [place], [place] [address] Tel. [phone]    Fax. [phone]

## 2016-04-15 NOTE — Progress Notes (Signed)
Patient ID: Juan [name], male   DOB: 12-Dec-1940, 75 y.o.   MRN: [id number]  [facility] Progress Note   Assessment/ Plan:   1. Acute kidney injury on chronic kidney disease stage III: Appears to from obstruction/CHF exacerbation. Improving well status post Foley catheter to alleviate bladder outlet obstruction. No acute electrolyte abnormality/uremic symptoms or significant volume overload to prompt intervention with dialysis. Unclear if the patient quite understands what is going on with him. 2. 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 Follow-up with them as an outpatient. 3. Anemia: Possibly anemia of chronic illness/chronic kidney disease. With significant iron deficiency, status post intravenous iron and will dose ESA for today. 4. Metabolic acidosis: Appears to be secondary to chronic kidney disease and distal RTA from obstruction. Decrease sodium bicarbonate dose with rising bicarbonate levels  Subjective:   Denies any active complaints and inquires about going home.    Objective:   BP (!) 105/55 (BP Location: Left Arm)   Pulse 83   Temp 98.2 F (36.8 C) (Oral)   Resp 18   Ht '6\' 3"'$  (1.905 m)   Wt 86 kg (189 lb 8 oz)   SpO2 94%   BMI 23.69 kg/m   Intake/Output Summary (Last 24 hours) at 04/15/16 1055 Last data filed at 04/15/16 0900  Gross per 24 hour  Intake              243 ml  Output             1276 ml  Net            -1033 ml   Weight change: 0.408 kg (14.4 oz)  Physical Exam: RWE:[id number] resting in bed, awakens to calling out his name PJK:DTOIZTI rhythm, normal rate, normal S1 and S2 Resp:Clear to auscultation, no distinct rales or rhonchi WPY:KDXI, slightly distended/obese, nontender Ext:2+ lower extremity edema bilaterally  Imaging: Ct Abdomen Pelvis Wo Contrast  Result Date: 04/15/2016 CLINICAL DATA:  Evaluate hydronephrosis. EXAM: CT ABDOMEN AND PELVIS WITHOUT CONTRAST TECHNIQUE:  Multidetector CT imaging of the abdomen and pelvis was performed following the standard protocol without IV contrast. COMPARISON:  None. FINDINGS: Lower chest: There is subsegmental atelectasis within the lung bases. There is also a mosaic attenuation pattern within the visualized portions of the lungs. No pleural effusion noted. Hepatobiliary: No focal liver abnormality is seen. No gallstones, gallbladder wall thickening, or biliary dilatation.There is subsegmental atelectasis noted in the lung bases. Pancreas: Unremarkable. No pancreatic ductal dilatation or surrounding inflammatory changes. Spleen: The spleen is enlarged measuring 15.4 cm in length. Adrenals/Urinary Tract: Normal appearance of the adrenal glands. Cyst within the inferior pole the right kidney is incompletely characterized without IV contrast measuring 3 cm. No renal calculi identified. Very mild bilateral pelvocaliectasis identified. No evidence for high-grade obstructive uropathy. The urinary bladder is collapsed around a Foley catheter. Stomach/Bowel: The stomach is normal. The small bowel loops have a normal course and caliber. Numerous colonic diverticula identified. No evidence for acute diverticulitis. Moderate stool burden is noted within the rectum. Vascular/Lymphatic: Aortic atherosclerosis. No adenopathy identified within the abdomen or the pelvis. Reproductive: Prostate is unremarkable. Other: No free fluid or fluid collections. Musculoskeletal: The bones are diffusely sclerotic compatible with widespread osseous metastatic disease. IMPRESSION: 1. Interval decompression of bilateral renal collecting systems with mild residual pelvocaliectasis status post catheter placement. At this time there is no evidence for high-grade obstructive uropathy of either kidney. 2. Splenomegaly. 3. Diffuse sclerotic bone metastases. 4. Aortic atherosclerosis.  Electronically Signed   By: Kerby Moors M.D.   On: 04/15/2016 10:25   US Renal  Result  Date: 04/13/2016 CLINICAL DATA:  Acute on chronic renal failure EXAM: RENAL / URINARY TRACT ULTRASOUND COMPLETE COMPARISON:  04/03/2014 FINDINGS: Right Kidney: Length: 11.1 cm. Increased echotexture. 2.8 cm lower pole cyst. Mild right hydronephrosis. Left Kidney: Length: 12.7 cm. Increased echotexture. There is severe left hydronephrosis. 1.4 cm echogenic, shadowing area in the lower pole, likely nonobstructing left renal stone. Bladder: Multiple large cystic areas within the pelvis which appear to be contiguous with the bladder compatible with diverticula and trabeculations, stable. IMPRESSION: Mild right hydronephrosis and severe left hydronephrosis. 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 Probable 14 mm nonobstructing left lower pole renal stone. Increased echotexture in the kidneys compatible with chronic medical renal disease. Electronically Signed   By: Rolm Baptise M.D.   On: 04/13/2016 13:01    Labs: BMET  Recent Labs Lab 04/09/16 0458 04/10/16 0528 04/11/16 0336 04/12/16 0541 04/13/16 0521 04/14/16 0350 04/15/16 0356  NA 136 134* 134* 135 134* 135 140  K 4.2 4.8 5.4* 5.0 4.1 3.4* 3.7  CL 106 102 103 100* 98* 101 104  CO2 22 21* 19* 20* 21* 23 25  GLUCOSE 109* 123* 115* 108* 108* 104* 111*  BUN 58* 69* 80* 89* 104* 106* 102*  CREATININE 1.94* 2.34* 2.34* 2.55* 3.10* 2.76* 2.20*  CALCIUM 9.4 9.3 9.4 9.6 9.2 8.9 9.4  PHOS  --   --   --   --   --  4.5 4.3   CBC  Recent Labs Lab 04/10/16 0528 04/11/16 0336 04/13/16 0521 04/14/16 0350 04/15/16 0356  WBC 14.3* 15.0* 18.7* 18.4* 22.5*  NEUTROABS 9.2* 9.5*  --   --  14.9*  HGB 8.7* 8.9* 8.4* 7.4* 7.9*  HCT 26.5* 27.2* 26.2* 22.9* 24.7*  MCV 77.7* 76.8* 77.1* 76.6* 78.4  PLT 575* 498* 571* 491* 565*    Medications:    . aspirin  325 mg Oral Daily  . carvedilol  3.125 mg Oral BID WC  . clotrimazole  10 mg Oral 5 X Daily  . feeding supplement (ENSURE ENLIVE)  237 mL Oral BID PC  .  ferrous gluconate  324 mg Oral Q2200  . ferumoxytol  510 mg Intravenous Weekly  . fluticasone  2 spray Each Nare Daily  . guaiFENesin  1,200 mg Oral BID  . heparin  5,000 Units Subcutaneous Q8H  . multivitamin with minerals  1 tablet Oral Daily  . polyethylene glycol  17 g Oral Daily  . sodium bicarbonate  1,300 mg Oral BID  . sodium chloride flush  3 mL Intravenous Q12H   Elmarie Shiley, MD 04/15/2016, 10:55 AM

## 2016-04-16 DIAGNOSIS — M109 Gout, unspecified: Secondary | ICD-10-CM

## 2016-04-16 LAB — RENAL FUNCTION PANEL
ALBUMIN: 2.2 g/dL — AB (ref 3.5–5.0)
Anion gap: 11 (ref 5–15)
BUN: 94 mg/dL — AB (ref 6–20)
CHLORIDE: 103 mmol/L (ref 101–111)
CO2: 26 mmol/L (ref 22–32)
Calcium: 9.2 mg/dL (ref 8.9–10.3)
Creatinine, Ser: 1.72 mg/dL — ABNORMAL HIGH (ref 0.61–1.24)
GFR calc Af Amer: 43 mL/min — ABNORMAL LOW (ref >60)
GFR calc non Af Amer: 37 mL/min — ABNORMAL LOW (ref >60)
GLUCOSE: 117 mg/dL — AB (ref 65–99)
POTASSIUM: 4 mmol/L (ref 3.5–5.1)
Phosphorus: 3.5 mg/dL (ref 2.5–4.6)
Sodium: 140 mmol/L (ref 135–145)

## 2016-04-16 MED ORDER — ALLOPURINOL 100 MG PO TABS
100.0000 mg | ORAL_TABLET | Freq: Every day | ORAL | Status: DC
  Administered 2016-04-16 – 2016-04-18 (×3): 100 mg via ORAL
  Filled 2016-04-16 (×3): qty 1

## 2016-04-16 MED ORDER — PREDNISONE 20 MG PO TABS
40.0000 mg | ORAL_TABLET | Freq: Every day | ORAL | Status: DC
  Administered 2016-04-16 – 2016-04-18 (×3): 40 mg via ORAL
  Filled 2016-04-16 (×4): qty 2

## 2016-04-16 NOTE — Progress Notes (Signed)
Subjective: Interval History: has complaints wrist pain, weak.  Objective: Vital signs in last 24 hours: Temp:  [97.5 F (36.4 C)-98.4 F (36.9 C)] 97.5 F (36.4 C) (11/30 0536) Pulse Rate:  [62-78] 62 (11/30 0800) Resp:  [18] 18 (11/30 0536) BP: (101-114)/(48-55) 114/55 (11/30 0800) SpO2:  [94 %-98 %] 98 % (11/30 0536) Weight:  [85.4 kg (188 lb 3.2 oz)] 85.4 kg (188 lb 3.2 oz) (11/30 0536) Weight change: -0.59 kg (-1 lb 4.8 oz)  Intake/Output from previous day: 11/29 0701 - 11/30 0700 In: 480 [P.O.:480] Out: 1500 [Urine:1500] Intake/Output this shift: Total I/O In: 220 [P.O.:220] Out: -   General appearance: cooperative, pale and slowed mentation Resp: diminished breath sounds bilaterally Cardio: S1, S2 normal and systolic murmur: holosystolic 2/6, blowing at apex GI: pos bs, liver down 5 cm Extremities: edema 1+, R wrist warm  Lab Results:  Recent Labs  04/14/16 0350 04/15/16 0356  WBC 18.4* 22.5*  HGB 7.4* 7.9*  HCT 22.9* 24.7*  PLT 491* 565*   BMET:  Recent Labs  04/14/16 0350 04/15/16 0356  NA 135 140  K 3.4* 3.7  CL 101 104  CO2 23 25  GLUCOSE 104* 111*  BUN 106* 102*  CREATININE 2.76* 2.20*  CALCIUM 8.9 9.4    Recent Labs  04/13/16 1855  PTH 121*   Iron Studies: No results for input(s): IRON, TIBC, TRANSFERRIN, FERRITIN in the last 72 hours.  Studies/Results: Ct Abdomen Pelvis Wo Contrast  Result Date: 04/15/2016 CLINICAL DATA:  Evaluate hydronephrosis. EXAM: CT ABDOMEN AND PELVIS WITHOUT CONTRAST TECHNIQUE: Multidetector CT imaging of the abdomen and pelvis was performed following the standard protocol without IV contrast. COMPARISON:  None. FINDINGS: Lower chest: There is subsegmental atelectasis within the lung bases. There is also a mosaic attenuation pattern within the visualized portions of the lungs. No pleural effusion noted. Hepatobiliary: No focal liver abnormality is seen. No gallstones, gallbladder wall thickening, or biliary  dilatation.There is subsegmental atelectasis noted in the lung bases. Pancreas: Unremarkable. No pancreatic ductal dilatation or surrounding inflammatory changes. Spleen: The spleen is enlarged measuring 15.4 cm in length. Adrenals/Urinary Tract: Normal appearance of the adrenal glands. Cyst within the inferior pole the right kidney is incompletely characterized without IV contrast measuring 3 cm. No renal calculi identified. Very mild bilateral pelvocaliectasis identified. No evidence for high-grade obstructive uropathy. The urinary bladder is collapsed around a Foley catheter. Stomach/Bowel: The stomach is normal. The small bowel loops have a normal course and caliber. Numerous colonic diverticula identified. No evidence for acute diverticulitis. Moderate stool burden is noted within the rectum. Vascular/Lymphatic: Aortic atherosclerosis. No adenopathy identified within the abdomen or the pelvis. Reproductive: Prostate is unremarkable. Other: No free fluid or fluid collections. Musculoskeletal: The bones are diffusely sclerotic compatible with widespread osseous metastatic disease. IMPRESSION: 1. Interval decompression of bilateral renal collecting systems with mild residual pelvocaliectasis status post catheter placement. At this time there is no evidence for high-grade obstructive uropathy of either kidney. 2. Splenomegaly. 3. Diffuse sclerotic bone metastases. 4. Aortic atherosclerosis. Electronically Signed   By: Kerby Moors M.D.   On: 04/15/2016 10:25   Dg Wrist Complete Right  Result Date: 04/15/2016 CLINICAL DATA:  Acute right wrist pain at the radial aspect of the wrist as well as the proximal second and third metacarpals for 1 day. Right arm weakness. Initial encounter. EXAM: RIGHT WRIST - COMPLETE 3+ VIEW COMPARISON:  None. FINDINGS: There is no evidence of acute fracture or dislocation. No focal osseous lesion or osseous  erosion is seen. Joint space widths are preserved. No focal soft tissue  abnormality is identified. IMPRESSION: No evidence of acute osseous abnormality. Electronically Signed   By: Logan Bores M.D.   On: 04/15/2016 19:25    I have reviewed the patient's current medications.  Assessment/Plan: 1 AKI/CKD 3 improving , chem not done, will do  .  Suspect BOO primary issue 2 Anemia multifact given Fe 3 myelodysplasia 4 Debill 5 Probable gout, discussed with primary 6 Bone lesions toget bone scan 7 BOO per Urology P chem, foley, fe hold po with obstip, bone scan, steroids, allopurinol    LOS: 8 days   Kaymarie Wynn,Wilma L 04/16/2016,10:03 AM

## 2016-04-16 NOTE — Progress Notes (Signed)
PROGRESS NOTE  Juan [name] ZTI:[id number] DOB: Apr 26, 1941 DOA: 04/06/2016 PCP: [name], MD  Brief History: 75 year old African-American male with a past medical history of coronary artery disease, diabetes, diastolic and systolic CHF, CAD, presented with complaints of SOB, weakness, cough and pain all over. Patient was just hospitalized from 11/15 - 11/18 for community-acquired pneumonia and treated with Levaquin. Patient was discharged home on Levaquin, but notes that he is on a fixed income and couldn't afford to fillthe prescription. Chest x-ray at the time of admission showed basilar atelectasis with diffuse airspace opacities concerning for pulmonary edema. The patient was started on intravenous furosemide with good clinical results. Cardiology was consulted and ultimately felt patient was euvolemic. Therefore his diuretics were discontinued, and cardiology signed off.Given the patient's overall medical condition, they recommended stopping diuretics altogether and using lasix prn only. Palliative medicine was consulted but, patient not willing to engage in conversation regarding long-term goals nor is he willing to have his family as part of any discussions regarding his care.  He continues to have very poor insight into his medical condition and continues to refuse to engage in any type of conversation regarding his medical care. During his hospital stay, the patient has developed acute on chronic renal failure for which nephrology was consulted. Renal ultrasound showed obstructive uropathy. Urology has been consulted to assist with management.   Assessment/Plan: Acute on chronic systolic and diastolic CHF -82/50/5397 echo EF 55-60%, grade 1 DD, PASP 33, mild RV dilatation -restarted lasix 40 mg IV BID initially -Admission chest x-ray shows pulmonary vascular congestion and pulmonary edema -daily weights--dry weight ~194. Weight on 11/30:188 pounds. -appreciate cardiology  consult-->furosemide, hydralazine, imdur stopped 11/24--cardiology signed off -d/c hydralazine and imdur due to soft BPs - Clinically appears euvolemic. When necessary Lasix.  Pulmonary infiltrates -doubt if his clinical syndrome was consistent with pneumonia -The patient was discharged on 04/04/2016 with instructionsto finish 5 more days levoflox -d/c levoflox after 11/25dose--completed 5 days during this inpatient stay -procalcitonin 0.43-->3.16-->2.68 -04/10/16 repeat CXR--stable infiltrates -stable on 2 L   SVT -pt having 1-2 seconds of SVT, asymptomatic--2 episodes on 11/26 -may increase BB if BP is able to tolerate if episodes more frequent or longer in duration - Having transient intermittent SVT, asymptomatic  Acute on chronic renal failure--CKD 3 -Baseline creatinine 1.4-1.8 -Monitor with diuresis -serum creatinine peak 3.10 -Likely secondary to hemodynamic changes due to his soft blood pressures and a degree of volume depletion and poor oral intake and most importantly from obstructive uropathy -appreciate nephrology follow-up. -renal US--mild R, severe L-hydronephrosis -not a HD candidate - Creatinine has improved to 1.7. Appears to be from obstruction. This is improved after Foley catheter to alleviate bladder outlet obstruction. Follow BMP. Creatinine approaching baseline.  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 He had 3% blasts in the peripheral blood,  which was confirmed by flow cytometry -recommended Jakafi at that time, but pt refused BM bx and was lost to follow up at [facility] -04/07/16 diff with increase WBC precursors -consulted hematology-- Dr. Melissa Montane flow cytometry-->3% myeloblastic cells -iron sat 4 %, ferritin 417, B12--1372, folate 56.7 -He is  afebrile and hemodynamically stable - As per hematology consultation 11/29: Patient's clinical picture is consistent with essential thrombocytosis/myeloproliferative syndrome. There is no evidence of transformation to acute leukemia. He has been on anagrelide in the past with good platelet control and this should be resumed at discharge. Follow-up with Dr.Ennever will be arranged in February.  Anemia - Status post IV iron and nephrology will provide ESA  Diabetes mellitus type 2, controlled -12/26/2015 and hemoglobin A1c 6.1 -Not on any oral agents outpt  Hyperkalemia -kayexalate given on 11/25 and 11/26. Resolved.  GOC -remains full code--discussed with patient -04/12/16--he allowed me to speak with his daughter--she was updated and will try to speak with patient- -04/10/16--consulted palliative medicine--ptnot willing to engage in conversation regarding long-term goals nor is he willing to have his family as part of any discussions regarding his care. -He continues to have very poor insight into his medical condition and continues to refuse to engage in any type of conversation regarding his medical care. -11/27--case discussed with Dr. Domingo Cocking  Right wrist pain - Unclear etiology. Denies history of gout. No trauma reported. Except painful range of movements, no acute findings noted. Check x-ray of right wrist to rule out fracture (low index of suspicion) >no fractures. Check uric acid: 19.4. Symptomatic treatment. - X-rays have shown mottled appearance of osseus structures compatible with mixed sclerotic and lucent lesions of unclear etiology? Metastatic  disease.? Bone scan - Right wrist mildly swollen and warm on 11/30. Could be acute gout. Started oral prednisone 40 mg daily and allopurinol 100 MG daily. - As discussed with Dr. Jimmy Footman, his bone changes on imaging not consistent with hyperparathyroidism and hence reasonable to obtain bone scan-ordered.   Disposition Plan: SNF when cleared by consultants Family Communication: None at bedside   Consultants: Nephrology, Urology, Hematology/Onc,   Code Status: FULL   DVT Prophylaxis: Brown Heparin    Procedures: As Listed in Progress Note Above  Antibiotics: Levofloxacin 11/20>>>11/25     Subjective: Ongoing right wrist pain. No chest pain or dyspnea reported.  Objective: Vitals:   04/15/16 2011 04/16/16 0536 04/16/16 0800 04/16/16 1125  BP: (!) 101/54 (!) 110/52 (!) 114/55 (!) 115/55  Pulse: 78 74 62 75  Resp: '18 18  18  '$ Temp: 97.8 F (36.6 C) 97.5 F (36.4 C)  97 F (36.1 C)  TempSrc: Oral Oral  Oral  SpO2: 94% 98%  95%  Weight:  85.4 kg (188 lb 3.2 oz)    Height:        Intake/Output Summary (Last 24 hours) at 04/16/16 1415 Last data filed at 04/16/16 1300  Gross per 24 hour  Intake              580 ml  Output             1600 ml  Net            -1020 ml   Weight change: -0.59 kg (-1 lb 4.8 oz) Exam:   General:  Pt is alert, follows commands appropriately, not in acute distress. Pleasant elderly male, chronically ill-looking and lying comfortably propped up in bed.  HEENT: No icterus, No thrush, No neck mass, [place]/AT  Cardiovascular: RRR, S1/S2, no rubs, no gallops. Telemetry: Sinus rhythm. No further NSSVT.  Respiratory:Bibasilar crackles but no wheezing. Good air movement. Rest of lung fields clear to auscultation. No increased work of breathing.  Abdomen: Soft/+BS, non tender, non distended, no guarding  Extremities: 1 + LE edema, No lymphangitis, No petechiae, No  rashes, no synovitis. Right wrist mildly swollen and warm with painful  range of movements. No erythema.   Data Reviewed: I have personally reviewed following labs and imaging studies Basic Metabolic Panel:  Recent Labs Lab 04/12/16 0541 04/13/16 0521 04/14/16 0350 04/15/16 0356 04/16/16 1024  NA 135 134* 135 140 140  K 5.0 4.1 3.4* 3.7 4.0  CL 100* 98* 101 104 103  CO2 20* 21* '23 25 26  '$ GLUCOSE 108* 108* 104* 111* 117*  BUN 89* 104* 106* 102* 94*  CREATININE 2.55* 3.10* 2.76* 2.20* 1.72*  CALCIUM 9.6 9.2 8.9 9.4 9.2  PHOS  --   --  4.5 4.3 3.5   Liver Function Tests:  Recent Labs Lab 04/14/16 0350 04/15/16 0356 04/16/16 1024  AST 25  --   --   ALT 12*  --   --   ALKPHOS 82  --   --   BILITOT 0.7  --   --   PROT 5.1*  --   --   ALBUMIN 2.1* 2.1* 2.2*   No results for input(s): LIPASE, AMYLASE in the last 168 hours. No results for input(s): AMMONIA in the last 168 hours. Coagulation Profile: No results for input(s): INR, PROTIME in the last 168 hours. CBC:  Recent Labs Lab 04/10/16 0528 04/11/16 0336 04/13/16 0521 04/14/16 0350 04/15/16 0356  WBC 14.3* 15.0* 18.7* 18.4* 22.5*  NEUTROABS 9.2* 9.5*  --   --  14.9*  HGB 8.7* 8.9* 8.4* 7.4* 7.9*  HCT 26.5* 27.2* 26.2* 22.9* 24.7*  MCV 77.7* 76.8* 77.1* 76.6* 78.4  PLT 575* 498* 571* 491* 565*   Cardiac Enzymes: No results for input(s): CKTOTAL, CKMB, CKMBINDEX, TROPONINI in the last 168 hours. BNP: Invalid input(s): POCBNP CBG: No results for input(s): GLUCAP in the last 168 hours. HbA1C: No results for input(s): HGBA1C in the last 72 hours. Urine analysis:    Component Value Date/Time   COLORURINE YELLOW 04/13/2016 1714   APPEARANCEUR CLOUDY (A) 04/13/2016 1714   LABSPEC 1.016 04/13/2016 1714   PHURINE 5.0 04/13/2016 1714   GLUCOSEU NEGATIVE 04/13/2016 1714   GLUCOSEU NEGATIVE 05/02/2015 1008   HGBUR NEGATIVE 04/13/2016 1714   BILIRUBINUR NEGATIVE 04/13/2016 1714   KETONESUR NEGATIVE 04/13/2016 1714   PROTEINUR 30 (A) 04/13/2016 1714   UROBILINOGEN 0.2 05/02/2015  1008   NITRITE NEGATIVE 04/13/2016 1714   LEUKOCYTESUR TRACE (A) 04/13/2016 1714    Recent Results (from the past 240 hour(s))  Urine culture     Status: None   Collection Time: 04/14/16  2:39 PM  Result Value Ref Range Status   Specimen Description URINE, CATHETERIZED  Final   Special Requests NONE  Final   Culture NO GROWTH  Final   Report Status 04/15/2016 FINAL  Final     Scheduled Meds: . allopurinol  100 mg Oral Daily  . aspirin  325 mg Oral Daily  . carvedilol  3.125 mg Oral BID WC  . clotrimazole  10 mg Oral 5 X Daily  . darbepoetin ([name]) injection - NON-DIALYSIS  100 mcg Subcutaneous Q Wed-1800  . feeding supplement (ENSURE ENLIVE)  237 mL Oral BID PC  . ferumoxytol  510 mg Intravenous Weekly  . fluticasone  2 spray Each Nare Daily  . guaiFENesin  1,200 mg Oral BID  . heparin  5,000 Units Subcutaneous Q8H  . multivitamin with minerals  1 tablet Oral Daily  . polyethylene glycol  17 g Oral Daily  . predniSONE  40 mg Oral Q breakfast  .  sodium bicarbonate  650 mg Oral BID  . sodium chloride flush  3 mL Intravenous Q12H   Continuous Infusions:  Procedures/Studies: Ct Abdomen Pelvis Wo Contrast  Result Date: 04/15/2016 CLINICAL DATA:  Evaluate hydronephrosis. EXAM: CT ABDOMEN AND PELVIS WITHOUT CONTRAST TECHNIQUE: Multidetector CT imaging of the abdomen and pelvis was performed following the standard protocol without IV contrast. COMPARISON:  None. FINDINGS: Lower chest: There is subsegmental atelectasis within the lung bases. There is also a mosaic attenuation pattern within the visualized portions of the lungs. No pleural effusion noted. Hepatobiliary: No focal liver abnormality is seen. No gallstones, gallbladder wall thickening, or biliary dilatation.There is subsegmental atelectasis noted in the lung bases. Pancreas: Unremarkable. No pancreatic ductal dilatation or surrounding inflammatory changes. Spleen: The spleen is enlarged measuring 15.4 cm in length.  Adrenals/Urinary Tract: Normal appearance of the adrenal glands. Cyst within the inferior pole the right kidney is incompletely characterized without IV contrast measuring 3 cm. No renal calculi identified. Very mild bilateral pelvocaliectasis identified. No evidence for high-grade obstructive uropathy. The urinary bladder is collapsed around a Foley catheter. Stomach/Bowel: The stomach is normal. The small bowel loops have a normal course and caliber. Numerous colonic diverticula identified. No evidence for acute diverticulitis. Moderate stool burden is noted within the rectum. Vascular/Lymphatic: Aortic atherosclerosis. No adenopathy identified within the abdomen or the pelvis. Reproductive: Prostate is unremarkable. Other: No free fluid or fluid collections. Musculoskeletal: The bones are diffusely sclerotic compatible with widespread osseous metastatic disease. IMPRESSION: 1. Interval decompression of bilateral renal collecting systems with mild residual pelvocaliectasis status post catheter placement. At this time there is no evidence for high-grade obstructive uropathy of either kidney. 2. Splenomegaly. 3. Diffuse sclerotic bone metastases. 4. Aortic atherosclerosis. Electronically Signed   By: Kerby Moors M.D.   On: 04/15/2016 10:25   Dg Chest 2 View  Result Date: 04/06/2016 CLINICAL DATA:  Shortness of breath. Sore back radiating to both flanks. Discharge from [facility] on Saturday with diagnosis of pneumonia. Continued fever and chills. EXAM: CHEST  2 VIEW COMPARISON:  04/01/2016 FINDINGS: Shallow inspiration with linear atelectasis in the lung bases. Cardiac enlargement. Diffuse airspace opacities in the lungs consistent with edema or multifocal pneumonia. Similar appearance to previous study. No blunting of costophrenic angles. No pneumothorax. Calcified aorta. Thoracic scoliosis convex towards the right. IMPRESSION: Cardiac enlargement. Diffuse airspace disease suggesting edema or  pneumonia. Electronically Signed   By: Lucienne Capers M.D.   On: 04/06/2016 23:55   Dg Chest 2 View  Result Date: 04/01/2016 CLINICAL DATA:  Acute onset of fever, cough and body aches. Initial encounter. EXAM: CHEST  2 VIEW COMPARISON:  Chest radiograph performed 12/29/2015 FINDINGS: The lungs are well-aerated. Vascular congestion is noted. Bilateral central airspace opacities may reflect pneumonia or pulmonary edema. No pleural effusion or pneumothorax is seen. The heart is mildly enlarged. No acute osseous abnormalities are seen. IMPRESSION: Vascular congestion and mild cardiomegaly. Bilateral central airspace opacities may reflect pneumonia or pulmonary edema. Electronically Signed   By: Garald Balding M.D.   On: 04/01/2016 03:08   Dg Wrist Complete Right  Result Date: 04/15/2016 CLINICAL DATA:  Acute right wrist pain at the radial aspect of the wrist as well as the proximal second and third metacarpals for 1 day. Right arm weakness. Initial encounter. EXAM: RIGHT WRIST - COMPLETE 3+ VIEW COMPARISON:  None. FINDINGS: There is no evidence of acute fracture or dislocation. No focal osseous lesion or osseous erosion is seen. Joint space widths are preserved. No  focal soft tissue abnormality is identified. IMPRESSION: No evidence of acute osseous abnormality. Electronically Signed   By: Logan Bores M.D.   On: 04/15/2016 19:25   Ct Chest Wo Contrast  Result Date: 04/01/2016 CLINICAL DATA:  Chest pain. Evaluate for aortic aneurysm. History of myeloproliferative disorder. EXAM: CT CHEST WITHOUT CONTRAST TECHNIQUE: Multidetector CT imaging of the chest was performed following the standard protocol without IV contrast. COMPARISON:  06/21/2014 FINDINGS: Cardiovascular: Ascending thoracic aorta measures up to 4.4 cm and stable. Atherosclerotic calcifications involving the posterior aortic arch. Proximal descending thoracic aorta measures 3.8 cm and stable. Mid descending thoracic aorta measures 3.4 cm and  stable. Main pulmonary artery is markedly enlarged measuring 5 cm but stable. Mediastinum/Nodes: Small mediastinal lymph nodes have not significantly changed. There is a 1 cm hypodense nodule or cyst in the right thyroid lobe. There is subcutaneous edema in the chest. No significant pericardial fluid. Lungs/Pleura: Trachea and mainstem bronchi are patent. Again noted is a mosaic attenuation pattern throughout the lungs. This pattern probably represents multiple areas of air trapping. These are prominent pulmonary vessels in the lung bases. Volume loss in the right lower lobe related to elevation of the right hemidiaphragm. There is some septal thickening in the upper lobes. No significant pleural effusions. Upper Abdomen: Again noted is an enlarged spleen which is only partially visualized. No acute abnormality in the upper abdomen. There is a high-density structure within the stomach lumen. Musculoskeletal: The visualized bones are diffusely sclerotic which is similar to the previous examination. There are small scattered lucent areas throughout the bones. Some of these lucent areas/lesions have enlarged from the prior examination. Lucent lesion along the posterior left sixth rib now measures 1.1 cm and previously measured 0.7 cm. No evidence for pathologic fracture. 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 Recommend annual imaging followup by CTA or MRA. This recommendation follows 2010 ACCF/AHA/AATS/ACR/ASA/SCA/SCAI/SIR/STS/SVM Guidelines for the Diagnosis and Management of Patients with Thoracic Aortic Disease. Circulation. 2010; 121: O242-P536 Stable enlargement of pulmonary arteries compatible with pulmonary hypertension. Mosaic attenuation pattern throughout both lungs. Similar finding was present on the previous examination. This is probably related to a combination of air trapping and mild edema. Bones are diffusely sclerotic with enlarged lucent lesions. Findings  are highly concerning for diffuse bone metastasis. Splenomegaly. This is probably related to the history of myeloproliferative disorder. Electronically Signed   By: Markus Daft M.D.   On: 04/01/2016 11:14   US Renal  Result Date: 04/13/2016 CLINICAL DATA:  Acute on chronic renal failure EXAM: RENAL / URINARY TRACT ULTRASOUND COMPLETE COMPARISON:  04/03/2014 FINDINGS: Right Kidney: Length: 11.1 cm. Increased echotexture. 2.8 cm lower pole cyst. Mild right hydronephrosis. Left Kidney: Length: 12.7 cm. Increased echotexture. There is severe left hydronephrosis. 1.4 cm echogenic, shadowing area in the lower pole, likely nonobstructing left renal stone. Bladder: Multiple large cystic areas within the pelvis which appear to be contiguous with the bladder compatible with diverticula and trabeculations, stable. IMPRESSION: Mild right hydronephrosis and severe left hydronephrosis. 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 Probable 14 mm nonobstructing left lower pole renal stone. Increased echotexture in the kidneys compatible with chronic medical renal disease. Electronically Signed   By: Rolm Baptise M.D.   On: 04/13/2016 13:01   Dg Chest Port 1 View  Result Date: 04/10/2016 CLINICAL DATA:  Acute respiratory failure with hypoxia. Pulmonary edema versus other infectious or leukemic infiltrates. EXAM: PORTABLE CHEST 1 VIEW COMPARISON:  Chest x-rays dated 04/06/2016, 04/01/2016, 12/29/2015 and 01/21/2015. FINDINGS: No significant change compared to the most recent chest x-ray of 04/06/2016, with persistent bilateral perihilar and bibasilar opacities compatible with either pulmonary edema or multifocal pneumonia. No new lung findings. No pleural effusions seen. Heart size and mediastinal contours are stable. Atherosclerotic changes again noted at the aortic arch. Osseous structures appear mottled, particularly about each shoulder, compatible with the mixed sclerotic and lucent  lesions described on earlier chest CT of 04/01/2016. IMPRESSION: 1. Persistent perihilar and bibasilar airspace opacities, unchanged compared to the most recent chest x-ray of 04/06/2016, compatible with either pulmonary edema or multifocal pneumonia, favor pulmonary edema. No new lung findings. 2. Mottled appearance of the osseous structures, particularly prominent about each shoulder, compatible with the mixed sclerotic and lucent lesions described on chest CT of 04/01/2016, highly suspicious for metastatic disease. 3. Aortic atherosclerosis. Electronically Signed   By: Franki Cabot M.D.   On: 04/10/2016 19:06    Jontrell Bushong, MD, FACP, FHM. [facility] Pager [phone]  If 7PM-7AM, please contact night-coverage [email or web address] Password [email or web address] 04/16/2016, 2:15 PM   LOS: 8 days

## 2016-04-17 ENCOUNTER — Inpatient Hospital Stay (HOSPITAL_COMMUNITY): Payer: Medicare Other

## 2016-04-17 ENCOUNTER — Other Ambulatory Visit (HOSPITAL_COMMUNITY): Payer: Self-pay

## 2016-04-17 LAB — RENAL FUNCTION PANEL
ALBUMIN: 1.9 g/dL — AB (ref 3.5–5.0)
ANION GAP: 10 (ref 5–15)
BUN: 92 mg/dL — ABNORMAL HIGH (ref 6–20)
CO2: 27 mmol/L (ref 22–32)
Calcium: 9.2 mg/dL (ref 8.9–10.3)
Chloride: 101 mmol/L (ref 101–111)
Creatinine, Ser: 1.53 mg/dL — ABNORMAL HIGH (ref 0.61–1.24)
GFR calc Af Amer: 50 mL/min — ABNORMAL LOW (ref >60)
GFR, EST NON AFRICAN AMERICAN: 43 mL/min — AB (ref >60)
Glucose, Bld: 138 mg/dL — ABNORMAL HIGH (ref 65–99)
PHOSPHORUS: 3.6 mg/dL (ref 2.5–4.6)
POTASSIUM: 4.5 mmol/L (ref 3.5–5.1)
Sodium: 138 mmol/L (ref 135–145)

## 2016-04-17 LAB — BASIC METABOLIC PANEL
BUN: 5 mg/dL (ref 4–21)
Creatinine: 1.5 mg/dL — AB (ref 0.6–1.3)
GLUCOSE: 138 mg/dL
SODIUM: 138 mmol/L (ref 137–147)

## 2016-04-17 LAB — CBC
HEMATOCRIT: 25.8 % — AB (ref 39.0–52.0)
HEMOGLOBIN: 8 g/dL — AB (ref 13.0–17.0)
MCH: 24.5 pg — ABNORMAL LOW (ref 26.0–34.0)
MCHC: 31 g/dL (ref 30.0–36.0)
MCV: 78.9 fL (ref 78.0–100.0)
Platelets: 714 10*3/uL — ABNORMAL HIGH (ref 150–400)
RBC: 3.27 MIL/uL — ABNORMAL LOW (ref 4.22–5.81)
RDW: 20 % — ABNORMAL HIGH (ref 11.5–15.5)
WBC: 30.9 10*3/uL — AB (ref 4.0–10.5)

## 2016-04-17 LAB — CBC AND DIFFERENTIAL: WBC: 30.9 10^3/mL

## 2016-04-17 MED ORDER — TECHNETIUM TC 99M MEDRONATE IV KIT
25.0000 | PACK | Freq: Once | INTRAVENOUS | Status: AC | PRN
  Administered 2016-04-17: 25 via INTRAVENOUS

## 2016-04-17 NOTE — Clinical Social Work Note (Signed)
Per MD, patient may be a weekend discharge. CSW contacted admissions coordinator at [facility] to confirm this can be done and she stated that either has to have his children do his paperwork or the facility will call him for verbal information in regards to consent to treatment. Patient stated that he did not know where his children were and did not think that they would be visiting today but CSW could call his son. Patient stated that he did not want to talk on the phone to give consent for treatment but would rather have someone from the facility come do paperwork with him at the hospital so he could "know who he was dealing with."  Juan [name], [place]

## 2016-04-17 NOTE — Progress Notes (Signed)
Subjective: Interval History: has complaints not any appetite.  Objective: Vital signs in last 24 hours: Temp:  [97.7 F (36.5 C)-98 F (36.7 C)] 97.7 F (36.5 C) (12/01 0536) Pulse Rate:  [84] 84 (12/01 0536) Resp:  [18] 18 (12/01 0536) BP: (109-111)/(56-65) 109/65 (12/01 0536) SpO2:  [93 %-96 %] 96 % (12/01 0536) Weight:  [86.3 kg (190 lb 4.8 oz)] 86.3 kg (190 lb 4.8 oz) (12/01 0536) Weight change: 0.953 kg (2 lb 1.6 oz)  Intake/Output from previous day: 11/30 0701 - 12/01 0700 In: 677 [P.O.:560; IV Piggyback:117] Out: 1150 [Urine:1150] Intake/Output this shift: Total I/O In: 363 [P.O.:360; I.V.:3] Out: -   General appearance: cooperative and slowed mentation Resp: diminished breath sounds bilaterally and rhonchi bibasilar Cardio: S1, S2 normal and systolic murmur: holosystolic 2/6, blowing at apex GI: soft, non-tender; bowel sounds normal; no masses,  no organomegaly Extremities: edema 1-2+  Lab Results:  Recent Labs  04/15/16 0356 04/17/16 0334  WBC 22.5* 30.9*  HGB 7.9* 8.0*  HCT 24.7* 25.8*  PLT 565* 714*   BMET:  Recent Labs  04/16/16 1024 04/17/16 0333  NA 140 138  K 4.0 4.5  CL 103 101  CO2 26 27  GLUCOSE 117* 138*  BUN 94* 92*  CREATININE 1.72* 1.53*  CALCIUM 9.2 9.2   No results for input(s): PTH in the last 72 hours. Iron Studies: No results for input(s): IRON, TIBC, TRANSFERRIN, FERRITIN in the last 72 hours.  Studies/Results: Dg Wrist Complete Right  Result Date: 04/15/2016 CLINICAL DATA:  Acute right wrist pain at the radial aspect of the wrist as well as the proximal second and third metacarpals for 1 day. Right arm weakness. Initial encounter. EXAM: RIGHT WRIST - COMPLETE 3+ VIEW COMPARISON:  None. FINDINGS: There is no evidence of acute fracture or dislocation. No focal osseous lesion or osseous erosion is seen. Joint space widths are preserved. No focal soft tissue abnormality is identified. IMPRESSION: No evidence of acute osseous  abnormality. Electronically Signed   By: Logan Bores M.D.   On: 04/15/2016 19:25    I have reviewed the patient's current medications.  Assessment/Plan: 1 AKI /CKD AKI resolving related to obstruction and meds 2 Malnutrition 3 Bone lesions scan pending 4 myelodysplasia P conservative tx, will s/o at this time    LOS: 9 days   Juan [name],Juan [name] 04/17/2016,2:59 PM

## 2016-04-17 NOTE — Progress Notes (Signed)
PT Cancellation Note  Patient Details Name: Arpan Eskelson MRN: [id number] DOB: 27-Oct-1940   Cancelled Treatment:    Reason Eval/Treat Not Completed: Patient at procedure or test/unavailable;Patient declined, no reason specified. Pt's nurse reporting that pt refusing to allow staff to touch him or assist him with anything today. She also stated that he refused an X-ray earlier today and was very agitated. Transport also present to take pt to [facility]. PT will continue to f/u with pt as appropriate.   Clearnce Sorrel Gaelle Adriance 04/17/2016, 1:42 PM Sherie Don, [place], DPT [phone]

## 2016-04-17 NOTE — Final Consult Note (Signed)
Consultant Final Sign-Off Note    Assessment/Final recommendations  Juan [name] is a 75 y.o. male followed by me for Urinary retention. The base has baseline symptoms of obstructive lower urinary tract symptoms. 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 He was also noted to be in acute renal failure. Noncontrast CT scan done showed no obstructing kidney stones. Foley catheter was placed in the patient's any function has improved. 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  I spoke to the patient today about chronic indwelling Foley at least for the near future. We will plan to follow the patient up in clinic within the next 4 weeks to discuss ongoing management. Likely, given the patient's current mental status, he'll be unwilling to catheterize himself and may be managed with a indwelling Foley long-term that needs to be changed every 4 weeks.      Wound care (if applicable):    Diet at discharge: per primary team   Activity at discharge: per primary team   Follow-up appointment:   1 month w/ Dr. Louis Meckel  Pending results:  Unresulted Labs    None       Medication recommendations: none  Other recommendations: Foley change every 4 weeks   Thank you for allowing [place] to participate in the care of your patient!  Please consult [place] again if you have further needs for your patient.  Ardis Hughs 04/17/2016 7:35 AM    Subjective   Complaining of right arm pain, able to range it minimally with pain.  Objective  Vital signs in last 24 hours: Temp:  [97 F (36.1 C)-98 F (36.7 C)] 97.7 F (36.5 C) (12/01 0536) Pulse Rate:  [62-84] 84 (12/01 0536) Resp:  [18] 18 (12/01 0536) BP: (109-115)/(55-65) 109/65 (12/01 0536) SpO2:  [93 %-96 %] 96 % (12/01 0536) Weight:  [86.3 kg (190 lb 4.8 oz)] 86.3 kg (190 lb 4.8 oz) (12/01 0536)  General: NAD Weakness in right arm Foley  draining clear yellow urine.   Pertinent labs and Studies:  Recent Labs  04/15/16 0356 04/17/16 0334  WBC 22.5* 30.9*  HGB 7.9* 8.0*  HCT 24.7* 25.8*   BMET  Recent Labs  04/16/16 1024 04/17/16 0333  NA 140 138  K 4.0 4.5  CL 103 101  CO2 26 27  GLUCOSE 117* 138*  BUN 94* 92*  CREATININE 1.72* 1.53*  CALCIUM 9.2 9.2   No results for input(s): LABURIN in the last 72 hours. Results for orders placed or performed during the hospital encounter of 04/06/16  Urine culture     Status: None   Collection Time: 04/14/16  2:39 PM  Result Value Ref Range Status   Specimen Description URINE, CATHETERIZED  Final   Special Requests NONE  Final   Culture NO GROWTH  Final   Report Status 04/15/2016 FINAL  Final    Imaging: No results found.

## 2016-04-17 NOTE — Progress Notes (Signed)
PROGRESS NOTE  Juan [name] ZTI:[id number] DOB: Oct 14, 1940 DOA: 04/06/2016 PCP: [name], MD  Brief History: 75 year old African-American male with a past medical history of coronary artery disease, diabetes, diastolic and systolic CHF, CAD, presented with complaints of SOB, weakness, cough and pain all over. Patient was just hospitalized from 11/15 - 11/18 for community-acquired pneumonia and treated with Levaquin. Patient was discharged home on Levaquin, but notes that he is on a fixed income and couldn't afford to fillthe prescription. Chest x-ray at the time of admission showed basilar atelectasis with diffuse airspace opacities concerning for pulmonary edema. The patient was started on intravenous furosemide with good clinical results. Cardiology was consulted and ultimately felt patient was euvolemic. Therefore his diuretics were discontinued, and cardiology signed off.Given the patient's overall medical condition, they recommended stopping diuretics altogether and using lasix prn only. Palliative medicine was consulted but, patient not willing to engage in conversation regarding long-term goals nor is he willing to have his family as part of any discussions regarding his care.  He continues to have very poor insight into his medical condition and continues to refuse to engage in any type of conversation regarding his medical care. During his hospital stay, the patient has developed acute on chronic renal failure for which nephrology was consulted. Renal ultrasound showed obstructive uropathy. Urology has been consulted to assist with management.   Assessment/Plan: Acute on chronic systolic and diastolic CHF -82/50/5397 echo EF 55-60%, grade 1 DD, PASP 33, mild RV dilatation -restarted lasix 40 mg IV BID initially -Admission chest x-ray shows pulmonary vascular congestion and pulmonary edema -daily weights--dry weight ~194. Weight on 11/30:188 pounds. -appreciate cardiology  consult-->furosemide, hydralazine, imdur stopped 11/24--cardiology signed off -d/c hydralazine and imdur due to soft BPs - Clinically appears euvolemic. When necessary Lasix.  Pulmonary infiltrates -doubt if his clinical syndrome was consistent with pneumonia -The patient was discharged on 04/04/2016 with instructionsto finish 5 more days levoflox -d/c levoflox after 11/25dose--completed 5 days during this inpatient stay -procalcitonin 0.43-->3.16-->2.68 -04/10/16 repeat CXR--stable infiltrates -stable on 2 L [place]  SVT -pt having 1-2 seconds of SVT, asymptomatic--2 episodes on 11/26 -may increase BB if BP is able to tolerate if episodes more frequent or longer in duration - Having transient intermittent SVT, asymptomatic  Acute on chronic renal failure--CKD 3 -Baseline creatinine 1.4-1.8 -Monitor with diuresis -serum creatinine peak 3.10 -Likely secondary to hemodynamic changes due to his soft blood pressures and a degree of volume depletion and poor oral intake and most importantly from obstructive uropathy -appreciate nephrology follow-up. -renal US--mild R, severe L-hydronephrosis -not a HD candidate - Creatinine has improved to 1.5. Appears to be from obstruction. This is improved after Foley catheter to alleviate bladder outlet obstruction. Follow BMP. Creatinine approaching baseline. Nephrology signed off.  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 He had 3%  blasts in the peripheral blood, which was confirmed by flow cytometry -recommended Jakafi at that time, but pt refused BM bx and was lost to follow up at [facility] -04/07/16 diff with increase WBC precursors -consulted hematology-- Dr. Melissa Montane flow cytometry-->3% myeloblastic cells -iron sat 4 %, ferritin  417, B12--1372, folate 56.7 -He is afebrile and hemodynamically stable - As per hematology consultation 11/29: Patient's clinical picture is consistent with essential thrombocytosis/myeloproliferative syndrome. There is no evidence of transformation to acute leukemia. He has been on anagrelide in the past with good platelet control and this should be resumed at discharge. Follow-up with Dr.Ennever will be arranged in February.  Anemia - Status post IV iron and nephrology will provide ESA  Diabetes mellitus type 2, controlled -12/26/2015 and hemoglobin A1c 6.1 -Not on any oral agents outpt  Hyperkalemia -kayexalate given on 11/25 and 11/26. Resolved.  GOC -remains full code--discussed with patient -04/12/16--he allowed me to speak with his daughter--she was updated and will try to speak with patient- -04/10/16--consulted palliative medicine--ptnot willing to engage in conversation regarding long-term goals nor is he willing to have his family as part of any discussions regarding his care. -He continues to have very poor insight into his medical condition and continues to refuse to engage in any type of conversation regarding his medical care. -11/27--case discussed with Dr. Domingo Cocking  Right wrist pain - Unclear etiology. Denies history of gout. No trauma reported. Except painful range of movements, no acute findings noted. Check x-ray of right wrist to rule out fracture (low index of suspicion) >no fractures. Check uric acid: 19.4. Symptomatic treatment. - X-rays have shown mottled appearance of osseus structures compatible with mixed sclerotic and lucent lesions of  unclear etiology? Metastatic disease.? Bone scan - Right wrist mildly swollen and warm on 11/30. Could be acute gout. Started oral prednisone 40 mg daily and allopurinol 100 MG daily. - As discussed with Dr. Jimmy Footman, his bone changes on imaging not consistent with hyperparathyroidism and hence reasonable to obtain bone scan >diffusely abnormal uptake throughout the skeleton compatible with myelofibrosis but diffuse osseus metastatic disease from a non-hematological primary malignancy and metabolic bone disease can have this appearance. - Wrist pain may be slightly better. Continue prednisone.  Leukocytosis - Now likely worsened by steroids. No diarrhea.   Disposition Plan: SNF possibly in the next 1-2 days. Family Communication: Discussed with patient's daughter. Updated care and answered questions.   Consultants: Nephrology, Urology, Hematology/Onc,   Code Status: FULL   DVT Prophylaxis: [place] Heparin    Procedures: As Listed in Progress Note Above  Antibiotics: Levofloxacin 11/20>>>11/25     Subjective: Right wrist pain has slightly improved. No new complaints reported.  Objective: Vitals:   04/16/16 0800 04/16/16 1125 04/16/16 2100 04/17/16 0536  BP: (!) 114/55 (!) 115/55 (!) 111/56 109/65  Pulse: 62 75 84 84  Resp:  '18 18 18  '$ Temp:  97 F (36.1 C) 98 F (36.7 C) 97.7 F (36.5 C)  TempSrc:  Oral Oral Oral  SpO2:  95% 93% 96%  Weight:    86.3 kg (190 lb 4.8 oz)  Height:        Intake/Output Summary (Last 24 hours) at 04/17/16 1643 Last data filed at 04/17/16 1320  Gross per 24 hour  Intake              703 ml  Output             1150 ml  Net             -447 ml   Weight change: 0.953 kg (2 lb 1.6 oz) Exam:   General:  Pt is alert, follows commands appropriately, not in acute distress. Pleasant elderly male, chronically ill-looking and lying comfortably propped up in bed.  HEENT: No icterus, No  thrush, No neck mass, [place]/AT  Cardiovascular:  RRR, S1/S2, no rubs, no gallops. Telemetry: Sinus rhythm. No further NSSVT.  Respiratory:Bibasilar crackles but no wheezing. Good air movement. Rest of lung fields clear to auscultation. No increased work of breathing.  Abdomen: Soft/+BS, non tender, non distended, no guarding  Extremities: 1 + LE edema, No lymphangitis, No petechiae, No rashes, no synovitis. Right wrist mildly swollen and warm with painful range of movements. No erythema.   Data Reviewed: I have personally reviewed following labs and imaging studies Basic Metabolic Panel:  Recent Labs Lab 04/13/16 0521 04/14/16 0350 04/15/16 0356 04/16/16 1024 04/17/16 0333  NA 134* 135 140 140 138  K 4.1 3.4* 3.7 4.0 4.5  CL 98* 101 104 103 101  CO2 21* '23 25 26 27  '$ GLUCOSE 108* 104* 111* 117* 138*  BUN 104* 106* 102* 94* 92*  CREATININE 3.10* 2.76* 2.20* 1.72* 1.53*  CALCIUM 9.2 8.9 9.4 9.2 9.2  PHOS  --  4.5 4.3 3.5 3.6   Liver Function Tests:  Recent Labs Lab 04/14/16 0350 04/15/16 0356 04/16/16 1024 04/17/16 0333  AST 25  --   --   --   ALT 12*  --   --   --   ALKPHOS 82  --   --   --   BILITOT 0.7  --   --   --   PROT 5.1*  --   --   --   ALBUMIN 2.1* 2.1* 2.2* 1.9*   No results for input(s): LIPASE, AMYLASE in the last 168 hours. No results for input(s): AMMONIA in the last 168 hours. Coagulation Profile: No results for input(s): INR, PROTIME in the last 168 hours. CBC:  Recent Labs Lab 04/11/16 0336 04/13/16 0521 04/14/16 0350 04/15/16 0356 04/17/16 0334  WBC 15.0* 18.7* 18.4* 22.5* 30.9*  NEUTROABS 9.5*  --   --  14.9*  --   HGB 8.9* 8.4* 7.4* 7.9* 8.0*  HCT 27.2* 26.2* 22.9* 24.7* 25.8*  MCV 76.8* 77.1* 76.6* 78.4 78.9  PLT 498* 571* 491* 565* 714*   Cardiac Enzymes: No results for input(s): CKTOTAL, CKMB, CKMBINDEX, TROPONINI in the last 168 hours. BNP: Invalid input(s): POCBNP CBG: No results for input(s): GLUCAP in the last 168 hours. HbA1C: No results for input(s): HGBA1C in the  last 72 hours. Urine analysis:    Component Value Date/Time   COLORURINE YELLOW 04/13/2016 1714   APPEARANCEUR CLOUDY (A) 04/13/2016 1714   LABSPEC 1.016 04/13/2016 1714   PHURINE 5.0 04/13/2016 1714   GLUCOSEU NEGATIVE 04/13/2016 1714   GLUCOSEU NEGATIVE 05/02/2015 1008   HGBUR NEGATIVE 04/13/2016 1714   BILIRUBINUR NEGATIVE 04/13/2016 1714   KETONESUR NEGATIVE 04/13/2016 1714   PROTEINUR 30 (A) 04/13/2016 1714   UROBILINOGEN 0.2 05/02/2015 1008   NITRITE NEGATIVE 04/13/2016 1714   LEUKOCYTESUR TRACE (A) 04/13/2016 1714    Recent Results (from the past 240 hour(s))  Urine culture     Status: None   Collection Time: 04/14/16  2:39 PM  Result Value Ref Range Status   Specimen Description URINE, CATHETERIZED  Final   Special Requests NONE  Final   Culture NO GROWTH  Final   Report Status 04/15/2016 FINAL  Final     Scheduled Meds: . allopurinol  100 mg Oral Daily  . aspirin  325 mg Oral Daily  . carvedilol  3.125 mg Oral BID WC  . clotrimazole  10 mg Oral 5 X Daily  . darbepoetin ([name]) injection - NON-DIALYSIS  100 mcg  Subcutaneous Q Wed-1800  . feeding supplement (ENSURE ENLIVE)  237 mL Oral BID PC  . ferumoxytol  510 mg Intravenous Weekly  . fluticasone  2 spray Each Nare Daily  . guaiFENesin  1,200 mg Oral BID  . heparin  5,000 Units Subcutaneous Q8H  . multivitamin with minerals  1 tablet Oral Daily  . polyethylene glycol  17 g Oral Daily  . predniSONE  40 mg Oral Q breakfast  . sodium bicarbonate  650 mg Oral BID  . sodium chloride flush  3 mL Intravenous Q12H   Continuous Infusions:  Procedures/Studies: Ct Abdomen Pelvis Wo Contrast  Result Date: 04/15/2016 CLINICAL DATA:  Evaluate hydronephrosis. EXAM: CT ABDOMEN AND PELVIS WITHOUT CONTRAST TECHNIQUE: Multidetector CT imaging of the abdomen and pelvis was performed following the standard protocol without IV contrast. COMPARISON:  None. FINDINGS: Lower chest: There is subsegmental atelectasis within the  lung bases. There is also a mosaic attenuation pattern within the visualized portions of the lungs. No pleural effusion noted. Hepatobiliary: No focal liver abnormality is seen. No gallstones, gallbladder wall thickening, or biliary dilatation.There is subsegmental atelectasis noted in the lung bases. Pancreas: Unremarkable. No pancreatic ductal dilatation or surrounding inflammatory changes. Spleen: The spleen is enlarged measuring 15.4 cm in length. Adrenals/Urinary Tract: Normal appearance of the adrenal glands. Cyst within the inferior pole the right kidney is incompletely characterized without IV contrast measuring 3 cm. No renal calculi identified. Very mild bilateral pelvocaliectasis identified. No evidence for high-grade obstructive uropathy. The urinary bladder is collapsed around a Foley catheter. Stomach/Bowel: The stomach is normal. The small bowel loops have a normal course and caliber. Numerous colonic diverticula identified. No evidence for acute diverticulitis. Moderate stool burden is noted within the rectum. Vascular/Lymphatic: Aortic atherosclerosis. No adenopathy identified within the abdomen or the pelvis. Reproductive: Prostate is unremarkable. Other: No free fluid or fluid collections. Musculoskeletal: The bones are diffusely sclerotic compatible with widespread osseous metastatic disease. IMPRESSION: 1. Interval decompression of bilateral renal collecting systems with mild residual pelvocaliectasis status post catheter placement. At this time there is no evidence for high-grade obstructive uropathy of either kidney. 2. Splenomegaly. 3. Diffuse sclerotic bone metastases. 4. Aortic atherosclerosis. Electronically Signed   By: Kerby Moors M.D.   On: 04/15/2016 10:25   Dg Chest 2 View  Result Date: 04/06/2016 CLINICAL DATA:  Shortness of breath. Sore back radiating to both flanks. Discharge from [facility] on Saturday with diagnosis of pneumonia. Continued fever and chills. EXAM:  CHEST  2 VIEW COMPARISON:  04/01/2016 FINDINGS: Shallow inspiration with linear atelectasis in the lung bases. Cardiac enlargement. Diffuse airspace opacities in the lungs consistent with edema or multifocal pneumonia. Similar appearance to previous study. No blunting of costophrenic angles. No pneumothorax. Calcified aorta. Thoracic scoliosis convex towards the right. IMPRESSION: Cardiac enlargement. Diffuse airspace disease suggesting edema or pneumonia. Electronically Signed   By: Lucienne Capers M.D.   On: 04/06/2016 23:55   Dg Chest 2 View  Result Date: 04/01/2016 CLINICAL DATA:  Acute onset of fever, cough and body aches. Initial encounter. EXAM: CHEST  2 VIEW COMPARISON:  Chest radiograph performed 12/29/2015 FINDINGS: The lungs are well-aerated. Vascular congestion is noted. Bilateral central airspace opacities may reflect pneumonia or pulmonary edema. No pleural effusion or pneumothorax is seen. The heart is mildly enlarged. No acute osseous abnormalities are seen. IMPRESSION: Vascular congestion and mild cardiomegaly. Bilateral central airspace opacities may reflect pneumonia or pulmonary edema. Electronically Signed   By: Garald Balding M.D.   On:  04/01/2016 03:08   Dg Wrist Complete Right  Result Date: 04/15/2016 CLINICAL DATA:  Acute right wrist pain at the radial aspect of the wrist as well as the proximal second and third metacarpals for 1 day. Right arm weakness. Initial encounter. EXAM: RIGHT WRIST - COMPLETE 3+ VIEW COMPARISON:  None. FINDINGS: There is no evidence of acute fracture or dislocation. No focal osseous lesion or osseous erosion is seen. Joint space widths are preserved. No focal soft tissue abnormality is identified. IMPRESSION: No evidence of acute osseous abnormality. Electronically Signed   By: Logan Bores M.D.   On: 04/15/2016 19:25   Ct Chest Wo Contrast  Result Date: 04/01/2016 CLINICAL DATA:  Chest pain. Evaluate for aortic aneurysm. History of  myeloproliferative disorder. EXAM: CT CHEST WITHOUT CONTRAST TECHNIQUE: Multidetector CT imaging of the chest was performed following the standard protocol without IV contrast. COMPARISON:  06/21/2014 FINDINGS: Cardiovascular: Ascending thoracic aorta measures up to 4.4 cm and stable. Atherosclerotic calcifications involving the posterior aortic arch. Proximal descending thoracic aorta measures 3.8 cm and stable. Mid descending thoracic aorta measures 3.4 cm and stable. Main pulmonary artery is markedly enlarged measuring 5 cm but stable. Mediastinum/Nodes: Small mediastinal lymph nodes have not significantly changed. There is a 1 cm hypodense nodule or cyst in the right thyroid lobe. There is subcutaneous edema in the chest. No significant pericardial fluid. Lungs/Pleura: Trachea and mainstem bronchi are patent. Again noted is a mosaic attenuation pattern throughout the lungs. This pattern probably represents multiple areas of air trapping. These are prominent pulmonary vessels in the lung bases. Volume loss in the right lower lobe related to elevation of the right hemidiaphragm. There is some septal thickening in the upper lobes. No significant pleural effusions. Upper Abdomen: Again noted is an enlarged spleen which is only partially visualized. No acute abnormality in the upper abdomen. There is a high-density structure within the stomach lumen. Musculoskeletal: The visualized bones are diffusely sclerotic which is similar to the previous examination. There are small scattered lucent areas throughout the bones. Some of these lucent areas/lesions have enlarged from the prior examination. Lucent lesion along the posterior left sixth rib now measures 1.1 cm and previously measured 0.7 cm. No evidence for pathologic fracture. 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 Recommend annual imaging followup by CTA or MRA. This recommendation follows 2010  ACCF/AHA/AATS/ACR/ASA/SCA/SCAI/SIR/STS/SVM Guidelines for the Diagnosis and Management of Patients with Thoracic Aortic Disease. Circulation. 2010; 121: X211-H417 Stable enlargement of pulmonary arteries compatible with pulmonary hypertension. Mosaic attenuation pattern throughout both lungs. Similar finding was present on the previous examination. This is probably related to a combination of air trapping and mild edema. Bones are diffusely sclerotic with enlarged lucent lesions. 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 Splenomegaly. This is probably related to the history of myeloproliferative disorder. Electronically Signed   By: Markus Daft M.D.   On: 04/01/2016 11:14   Nm Bone Scan Whole Body  Result Date: 04/17/2016 CLINICAL DATA:  Diffuse bone pain.  History of myelofibrosis. EXAM: NUCLEAR MEDICINE WHOLE BODY BONE SCAN TECHNIQUE: Whole body anterior and posterior images were obtained approximately 3 hours after intravenous injection of radiopharmaceutical. RADIOPHARMACEUTICALS:  25.2 mCi Technetium-84mMDP IV COMPARISON:  Chest CT 04/01/2016.  CT abdomen and pelvis 04/14/2016. FINDINGS: There is diffusely increased radiotracer uptake throughout nearly the entirety of the axial and visualized appendicular skeleton with relative sparing of the skull. Symmetrically increased uptake is present about the hips, knees, ankles, and  shoulders. The kidneys are only faintly visualized without evidence of significant radiotracer excretion into the collecting system or bladder, and this may reflect a combination of renal failure and the diffuse osseous tracer uptake. IMPRESSION: Diffusely abnormal uptake throughout the skeleton which is compatible with the patient's history of myelofibrosis. Diffuse osseous metastatic disease from a non-hematologic primary malignancy and metabolic bone diseases can also give this appearance. Electronically Signed   By: Logan Bores M.D.   On: 04/17/2016 15:12   US  Renal  Result Date: 04/13/2016 CLINICAL DATA:  Acute on chronic renal failure EXAM: RENAL / URINARY TRACT ULTRASOUND COMPLETE COMPARISON:  04/03/2014 FINDINGS: Right Kidney: Length: 11.1 cm. Increased echotexture. 2.8 cm lower pole cyst. Mild right hydronephrosis. Left Kidney: Length: 12.7 cm. Increased echotexture. There is severe left hydronephrosis. 1.4 cm echogenic, shadowing area in the lower pole, likely nonobstructing left renal stone. Bladder: Multiple large cystic areas within the pelvis which appear to be contiguous with the bladder compatible with diverticula and trabeculations, stable. IMPRESSION: Mild right hydronephrosis and severe left hydronephrosis. 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 Probable 14 mm nonobstructing left lower pole renal stone. Increased echotexture in the kidneys compatible with chronic medical renal disease. Electronically Signed   By: Rolm Baptise M.D.   On: 04/13/2016 13:01   Dg Chest Port 1 View  Result Date: 04/10/2016 CLINICAL DATA:  Acute respiratory failure with hypoxia. Pulmonary edema versus other infectious or leukemic infiltrates. EXAM: PORTABLE CHEST 1 VIEW COMPARISON:  Chest x-rays dated 04/06/2016, 04/01/2016, 12/29/2015 and 01/21/2015. FINDINGS: No significant change compared to the most recent chest x-ray of 04/06/2016, with persistent bilateral perihilar and bibasilar opacities compatible with either pulmonary edema or multifocal pneumonia. No new lung findings. No pleural effusions seen. Heart size and mediastinal contours are stable. Atherosclerotic changes again noted at the aortic arch. Osseous structures appear mottled, particularly about each shoulder, compatible with the mixed sclerotic and lucent lesions described on earlier chest CT of 04/01/2016. IMPRESSION: 1. Persistent perihilar and bibasilar airspace opacities, unchanged compared to the most recent chest x-ray of 04/06/2016, compatible with either  pulmonary edema or multifocal pneumonia, favor pulmonary edema. No new lung findings. 2. Mottled appearance of the osseous structures, particularly prominent about each shoulder, compatible with the mixed sclerotic and lucent lesions described on chest CT of 04/01/2016, highly suspicious for metastatic disease. 3. Aortic atherosclerosis. Electronically Signed   By: Franki Cabot M.D.   On: 04/10/2016 19:06    Illana Nolting, MD, FACP, FHM. [facility] Pager [phone]  If 7PM-7AM, please contact night-coverage [email or web address] Password [email or web address] 04/17/2016, 4:43 PM   LOS: 9 days

## 2016-04-18 DIAGNOSIS — E790 Hyperuricemia without signs of inflammatory arthritis and tophaceous disease: Secondary | ICD-10-CM | POA: Diagnosis not present

## 2016-04-18 DIAGNOSIS — N189 Chronic kidney disease, unspecified: Secondary | ICD-10-CM | POA: Diagnosis present

## 2016-04-18 DIAGNOSIS — M10031 Idiopathic gout, right wrist: Secondary | ICD-10-CM | POA: Diagnosis not present

## 2016-04-18 DIAGNOSIS — Z719 Counseling, unspecified: Secondary | ICD-10-CM | POA: Diagnosis not present

## 2016-04-18 DIAGNOSIS — N1330 Unspecified hydronephrosis: Secondary | ICD-10-CM | POA: Diagnosis not present

## 2016-04-18 DIAGNOSIS — N320 Bladder-neck obstruction: Secondary | ICD-10-CM | POA: Diagnosis present

## 2016-04-18 DIAGNOSIS — R531 Weakness: Secondary | ICD-10-CM | POA: Diagnosis not present

## 2016-04-18 DIAGNOSIS — D471 Chronic myeloproliferative disease: Secondary | ICD-10-CM | POA: Diagnosis not present

## 2016-04-18 DIAGNOSIS — N183 Chronic kidney disease, stage 3 (moderate): Secondary | ICD-10-CM | POA: Diagnosis not present

## 2016-04-18 DIAGNOSIS — M25552 Pain in left hip: Secondary | ICD-10-CM | POA: Diagnosis not present

## 2016-04-18 DIAGNOSIS — E875 Hyperkalemia: Secondary | ICD-10-CM | POA: Diagnosis not present

## 2016-04-18 DIAGNOSIS — D508 Other iron deficiency anemias: Secondary | ICD-10-CM | POA: Diagnosis not present

## 2016-04-18 DIAGNOSIS — Z818 Family history of other mental and behavioral disorders: Secondary | ICD-10-CM | POA: Diagnosis not present

## 2016-04-18 DIAGNOSIS — C946 Myelodysplastic disease, not classified: Secondary | ICD-10-CM | POA: Diagnosis present

## 2016-04-18 DIAGNOSIS — M109 Gout, unspecified: Secondary | ICD-10-CM | POA: Diagnosis present

## 2016-04-18 DIAGNOSIS — N179 Acute kidney failure, unspecified: Secondary | ICD-10-CM | POA: Diagnosis not present

## 2016-04-18 DIAGNOSIS — I5043 Acute on chronic combined systolic (congestive) and diastolic (congestive) heart failure: Secondary | ICD-10-CM | POA: Diagnosis not present

## 2016-04-18 DIAGNOSIS — R293 Abnormal posture: Secondary | ICD-10-CM | POA: Diagnosis not present

## 2016-04-18 DIAGNOSIS — M25531 Pain in right wrist: Secondary | ICD-10-CM | POA: Diagnosis present

## 2016-04-18 DIAGNOSIS — I2510 Atherosclerotic heart disease of native coronary artery without angina pectoris: Secondary | ICD-10-CM | POA: Diagnosis present

## 2016-04-18 DIAGNOSIS — E1140 Type 2 diabetes mellitus with diabetic neuropathy, unspecified: Secondary | ICD-10-CM | POA: Diagnosis present

## 2016-04-18 DIAGNOSIS — N400 Enlarged prostate without lower urinary tract symptoms: Secondary | ICD-10-CM | POA: Diagnosis not present

## 2016-04-18 DIAGNOSIS — R404 Transient alteration of awareness: Secondary | ICD-10-CM | POA: Diagnosis not present

## 2016-04-18 DIAGNOSIS — E538 Deficiency of other specified B group vitamins: Secondary | ICD-10-CM | POA: Diagnosis not present

## 2016-04-18 DIAGNOSIS — Z791 Long term (current) use of non-steroidal anti-inflammatories (NSAID): Secondary | ICD-10-CM | POA: Diagnosis not present

## 2016-04-18 DIAGNOSIS — J189 Pneumonia, unspecified organism: Secondary | ICD-10-CM | POA: Diagnosis present

## 2016-04-18 DIAGNOSIS — J309 Allergic rhinitis, unspecified: Secondary | ICD-10-CM | POA: Diagnosis present

## 2016-04-18 DIAGNOSIS — I509 Heart failure, unspecified: Secondary | ICD-10-CM | POA: Diagnosis not present

## 2016-04-18 DIAGNOSIS — I712 Thoracic aortic aneurysm, without rupture: Secondary | ICD-10-CM | POA: Diagnosis present

## 2016-04-18 DIAGNOSIS — R161 Splenomegaly, not elsewhere classified: Secondary | ICD-10-CM | POA: Diagnosis present

## 2016-04-18 DIAGNOSIS — D473 Essential (hemorrhagic) thrombocythemia: Secondary | ICD-10-CM | POA: Diagnosis not present

## 2016-04-18 DIAGNOSIS — I5033 Acute on chronic diastolic (congestive) heart failure: Secondary | ICD-10-CM | POA: Diagnosis not present

## 2016-04-18 DIAGNOSIS — R262 Difficulty in walking, not elsewhere classified: Secondary | ICD-10-CM | POA: Diagnosis not present

## 2016-04-18 DIAGNOSIS — M25532 Pain in left wrist: Secondary | ICD-10-CM | POA: Diagnosis present

## 2016-04-18 DIAGNOSIS — J80 Acute respiratory distress syndrome: Secondary | ICD-10-CM | POA: Diagnosis not present

## 2016-04-18 DIAGNOSIS — J9601 Acute respiratory failure with hypoxia: Secondary | ICD-10-CM | POA: Diagnosis not present

## 2016-04-18 DIAGNOSIS — M25551 Pain in right hip: Secondary | ICD-10-CM | POA: Diagnosis not present

## 2016-04-18 DIAGNOSIS — E1122 Type 2 diabetes mellitus with diabetic chronic kidney disease: Secondary | ICD-10-CM | POA: Diagnosis present

## 2016-04-18 DIAGNOSIS — R278 Other lack of coordination: Secondary | ICD-10-CM | POA: Diagnosis not present

## 2016-04-18 DIAGNOSIS — D72829 Elevated white blood cell count, unspecified: Secondary | ICD-10-CM | POA: Diagnosis not present

## 2016-04-18 DIAGNOSIS — I10 Essential (primary) hypertension: Secondary | ICD-10-CM | POA: Diagnosis not present

## 2016-04-18 DIAGNOSIS — E119 Type 2 diabetes mellitus without complications: Secondary | ICD-10-CM | POA: Diagnosis not present

## 2016-04-18 DIAGNOSIS — I5042 Chronic combined systolic (congestive) and diastolic (congestive) heart failure: Secondary | ICD-10-CM | POA: Diagnosis not present

## 2016-04-18 DIAGNOSIS — D649 Anemia, unspecified: Secondary | ICD-10-CM | POA: Diagnosis present

## 2016-04-18 DIAGNOSIS — Z794 Long term (current) use of insulin: Secondary | ICD-10-CM | POA: Diagnosis not present

## 2016-04-18 DIAGNOSIS — R918 Other nonspecific abnormal finding of lung field: Secondary | ICD-10-CM | POA: Diagnosis not present

## 2016-04-18 DIAGNOSIS — I130 Hypertensive heart and chronic kidney disease with heart failure and stage 1 through stage 4 chronic kidney disease, or unspecified chronic kidney disease: Secondary | ICD-10-CM | POA: Diagnosis present

## 2016-04-18 DIAGNOSIS — I714 Abdominal aortic aneurysm, without rupture: Secondary | ICD-10-CM | POA: Diagnosis present

## 2016-04-18 DIAGNOSIS — R488 Other symbolic dysfunctions: Secondary | ICD-10-CM | POA: Diagnosis not present

## 2016-04-18 DIAGNOSIS — M6281 Muscle weakness (generalized): Secondary | ICD-10-CM | POA: Diagnosis not present

## 2016-04-18 DIAGNOSIS — N401 Enlarged prostate with lower urinary tract symptoms: Secondary | ICD-10-CM | POA: Diagnosis not present

## 2016-04-18 DIAGNOSIS — I129 Hypertensive chronic kidney disease with stage 1 through stage 4 chronic kidney disease, or unspecified chronic kidney disease: Secondary | ICD-10-CM | POA: Diagnosis not present

## 2016-04-18 DIAGNOSIS — R635 Abnormal weight gain: Secondary | ICD-10-CM | POA: Diagnosis not present

## 2016-04-18 DIAGNOSIS — I471 Supraventricular tachycardia: Secondary | ICD-10-CM | POA: Diagnosis not present

## 2016-04-18 DIAGNOSIS — E1142 Type 2 diabetes mellitus with diabetic polyneuropathy: Secondary | ICD-10-CM | POA: Diagnosis not present

## 2016-04-18 DIAGNOSIS — E1340 Other specified diabetes mellitus with diabetic neuropathy, unspecified: Secondary | ICD-10-CM | POA: Diagnosis not present

## 2016-04-18 DIAGNOSIS — R52 Pain, unspecified: Secondary | ICD-10-CM | POA: Diagnosis not present

## 2016-04-18 DIAGNOSIS — Z87891 Personal history of nicotine dependence: Secondary | ICD-10-CM | POA: Diagnosis not present

## 2016-04-18 DIAGNOSIS — G4733 Obstructive sleep apnea (adult) (pediatric): Secondary | ICD-10-CM | POA: Diagnosis present

## 2016-04-18 DIAGNOSIS — J989 Respiratory disorder, unspecified: Secondary | ICD-10-CM | POA: Diagnosis not present

## 2016-04-18 DIAGNOSIS — R339 Retention of urine, unspecified: Secondary | ICD-10-CM | POA: Diagnosis present

## 2016-04-18 DIAGNOSIS — K649 Unspecified hemorrhoids: Secondary | ICD-10-CM | POA: Diagnosis present

## 2016-04-18 MED ORDER — ALLOPURINOL 100 MG PO TABS: 100.0000 mg | ORAL_TABLET | Freq: Every day | ORAL | Status: DC

## 2016-04-18 MED ORDER — HYDROCODONE-ACETAMINOPHEN 5-325 MG PO TABS: 1.0000 | ORAL_TABLET | Freq: Three times a day (TID) | ORAL | 0 refills | Status: DC | PRN

## 2016-04-18 MED ORDER — POLYETHYLENE GLYCOL 3350 17 G PO PACK: 17.0000 g | PACK | Freq: Every day | ORAL | Status: DC

## 2016-04-18 MED ORDER — CARVEDILOL 3.125 MG PO TABS: 3.1250 mg | ORAL_TABLET | Freq: Two times a day (BID) | ORAL | Status: DC

## 2016-04-18 MED ORDER — ACETAMINOPHEN 500 MG PO TABS: 500.0000 mg | ORAL_TABLET | Freq: Four times a day (QID) | ORAL | Status: DC | PRN

## 2016-04-18 MED ORDER — BISACODYL 10 MG RE SUPP: 10.0000 mg | Freq: Every day | RECTAL | Status: DC | PRN

## 2016-04-18 MED ORDER — PREDNISONE 10 MG PO TABS: ORAL_TABLET | ORAL | Status: DC

## 2016-04-18 NOTE — Discharge Summary (Signed)
Physician Discharge Summary  Juan [name] ZDG:[id number] DOB: 29-May-1940  PCP: [name], MD  Admit date: 04/06/2016 Discharge date: 04/18/2016  Recommendations for Outpatient Follow-up:  1. M.D. at SNF in 3 days with repeat labs (CBC & BMP). 2. Dr. [name], PCP upon discharge from SNF. 3. Dr. Louis Meckel, Urology in 4 weeks. SNF to coordinate. 4. Dr. Burney Gauze, Oncology: MDs office will assist with follow-up appointment in February 2018. SNF to coordinate. 5. Patient will be discharged to SNF with indwelling Foley catheter which has to be changed every 4 weeks. Follow-up with outpatient urology regarding further management of Foley catheter.  6. Recommend palliative care consultation and follow-up at SNF.  7. Recommend following chest x-ray in 3-4 weeks to ensure resolution of pneumonia findings.  Home Health: None Equipment/Devices: Indwelling Foley catheter    Discharge Condition: Improved and stable  CODE STATUS: Full  Diet recommendation: Heart healthy diet  Discharge Diagnoses:  Principal Problem:   CHF exacerbation ([place]) Active Problems:   Myeloproliferative disease ([place])   Leukocytosis   Thoracic aortic aneurysm ([place])   Community acquired pneumonia   Acute renal failure superimposed on chronic kidney disease (HCC)   Thrombocytosis (HCC)   Anemia   Systolic and diastolic CHF, acute on chronic (HCC)   Acute respiratory failure with hypoxia (HCC)   Constipation   Acute on chronic renal failure (HCC)   Hydronephrosis   Brief/Interim Summary: 75 year old male with PMH of HTN, DM 2, CAD, nonischemic Myoview 9518, chronic diastolic CHF, AAA, OSA not on C Pap, anemia, thrombocytosis, myeloproliferative disease, recent hospitalization 04/01/16-04/04/16 for community-acquired pneumonia when he was discharged home on levofloxacin but couldn't afford to fill the prescription, was admitted to [facility] on 04/07/16 with complaints of cough productive of clear sputum, dyspnea,  generalized pain, subjective fevers and chills, diaphoresis, leg swelling, weight gain and gait disturbance. Evaluation in ED revealed BUN 62, creatinine 2.22 and chest x-ray showed diffuse airspace disease suggesting edema. He was admitted for further evaluation and management.  Assessment and plan:  1. Acute on chronic combined systolic and diastolic CHF: 2-D echo 12/20/14: LVEF 55-60 percent, grade 1 diastolic dysfunction. Admission chest x-ray and clinical picture were suspicious for decompensated CHF. He was initially treated with IV Lasix and clinically improved. Euvolemic. Advanced heart failure team subsequently saw him and advised to discontinue diuretics and only use as needed. Hydralazine and nitrates were also discontinued due to hypotension issues. 2. Acute on stage III chronic kidney disease/obstructive uropathy from bladder outlet obstruction: Baseline creatinine 1.4-1.8. Serum creatinine peaked at 3.1. May have been multifactorial secondary to hemodynamic changes from hypotension, volume depletion from poor oral intake but most importantly was from obstructive uropathy. Renal ultrasound showed mild right and severe left hydronephrosis. After insertion of Foley catheter, creatinine has steadily improved to 1.4. Nephrology assisted with management and signed off. Patient is not a candidate for hemodialysis. 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 They recommend continuing Foley catheter, change every 4 weeks and outpatient follow-up in 4 weeks. 3. Anemia: Status post IV iron and erythropoietin. Hemoglobin stable. Follow CBCs as outpatient. 4. Leukocytosis/Thrombocytosis: May be a component of his myeloproliferative disorder. Leukocytosis also complicated by steroids. Outpatient follow-up with hematology/oncology. 5. Type II DM: A1c 6.1 on  12/26/15. Not on medications as outpatient. 6. Nonsustained SVT: Patient noted to have occasional episodes of nonsustained SVT. Continue low-dose carvedilol. 7. Hyperkalemia:  Resolved after a dose of Kayexalate. 8. Pulmonary infiltrates: Low index of suspicion for pneumonia clinically. Patient was discharged on 04/04/16 with instructions to finish 5 additional days of levofloxacin which he did not fill. He completed 5 days of Levaquin in the hospital. 9. Essential thrombocytosis/myeloproliferative syndrome: Patient was seen by hematology/oncology. They indicated that he has history consistent with essential thrombocytosis dating back to 2007 at least with multiple hematology visits since documenting anemia, leukocytosis and 1-3 percent blasts on smear with repeatedly unremarkable B12, folate and ferritin. Bone marrow was discussed but patient refused. He has sclerotic bone lesions which may be contributing to his generalized pains. Overall he was noted to have a poor insight into his general medical condition. There is no evidence of transformation to acute leukemia. Although recommendations were made to resume Anagrelide that he apparently had been in the past, upon review of outpatient hematology records up to June 2016, did not see this on his medication list. Discussed with Dr. Marin Olp primary oncologist who does not recollect patient being on this and hence agreed that he will not be discharged on this medication. Outpatient follow-up with oncology. As per hematologist, repeat PSA is low. 10. Constipation: Initiated bowel regimen. 11. Adult failure to thrive: Advanced heart failure team consulted. He is well-known to this clinic. They noted deterioration noted in the past few weeks, euvolemic, EF normal and recommended stopping diuretics and using diuretics only when necessary and recommended palliative care consultation. Hydralazine and nitrates were also discontinued due to hypotension. Palliative care  was consulted and they agreed that he has limited insight into the severity of his condition, he did not engage in discussion regarding goals of care, he was unwilling to have his family part of any discussions regarding his care and wanted to remain full code with continued aggressive care. 12. Right wrist pain/acute gouty arthritis/hyperuricemia: X-ray of right wrist negative for acute findings. Uric acid 19.4. Due to prior imaging study showing mottled appearance of osseus structure suggestive of mixed sclerotic and lucent lesions suspicious for metastatic disease, obtained a bone scan which showed diffuse abnormal uptake throughout the skeleton compatible with myelofibrosis but diffuse osseous metastatic disease from a non-hematological primary malignancy and metabolic bone disease can have this appearance. Treated with oral prednisone with good clinical improvement. Continue gradual taper over the next week and discontinue. Started allopurinol. Recheck serum uric acid in a couple of weeks. 13. Ascending aortic aneurysm, 4.4 cm noted on CT chest 04/01/16: Will not be a candidate for surgical intervention. Consider annual imaging as deemed necessary.   Discharge Instructions  Discharge Instructions    (HEART FAILURE PATIENTS) Call MD:  Anytime you have any of the following symptoms: 1) 3 pound weight gain in 24 hours or 5 pounds in 1 week 2) shortness of breath, with or without a dry hacking cough 3) swelling in the hands, feet or stomach 4) if you have to sleep on extra pillows at night in order to breathe.    Complete by:  As directed    Call MD for:  difficulty breathing, headache or visual disturbances    Complete by:  As directed    Call MD for:  extreme fatigue    Complete by:  As directed    Call MD for:  persistant dizziness or light-headedness    Complete by:  As directed    Call MD for:  persistant nausea and vomiting    Complete by:  As directed    Call MD for:  redness, tenderness,  or signs of infection (pain, swelling, redness, odor or green/yellow discharge around incision site)    Complete by:  As directed    Call MD for:  severe uncontrolled pain    Complete by:  As directed    Call MD for:  temperature >100.4    Complete by:  As directed    Diet - low sodium heart healthy    Complete by:  As directed    Discharge instructions    Complete by:  As directed    Patient will be discharged to SNF with indwelling Foley catheter which has to be changed every 4 weeks. Outpatient follow-up with urology for further management.   Increase activity slowly    Complete by:  As directed        Medication List    STOP taking these medications   hydrALAZINE 25 MG tablet Commonly known as:  APRESOLINE   isosorbide mononitrate 30 MG 24 hr tablet Commonly known as:  IMDUR   levofloxacin 750 MG tablet Commonly known as:  LEVAQUIN   torsemide 20 MG tablet Commonly known as:  DEMADEX     TAKE these medications   acetaminophen 500 MG tablet Commonly known as:  TYLENOL Take 1 tablet (500 mg total) by mouth every 6 (six) hours as needed for mild pain, moderate pain, fever or headache. Total acetaminophen dose from all sources not to exceed 4 g per day. What changed:  how much to take  reasons to take this  additional instructions   allopurinol 100 MG tablet Commonly known as:  ZYLOPRIM Take 1 tablet (100 mg total) by mouth daily. Start taking on:  04/19/2016   aspirin 325 MG tablet Take 1 tablet (325 mg total) by mouth daily.   bisacodyl 10 MG suppository Commonly known as:  DULCOLAX Place 1 suppository (10 mg total) rectally daily as needed for moderate constipation.   carvedilol 3.125 MG tablet Commonly known as:  COREG Take 1 tablet (3.125 mg total) by mouth 2 (two) times daily. What changed:  See the new instructions.   feeding supplement (GLUCERNA SHAKE) Liqd Take 237 mLs by mouth as needed (protein). Reported on 08/21/2015   ferrous gluconate 225  (27 Fe) MG tablet Commonly known as:  FERGON Take 240 mg by mouth daily at 10 pm.   fluticasone 50 MCG/ACT nasal spray Commonly known as:  FLONASE Place 2 sprays into both nostrils daily. Reported on 05/02/2015   gabapentin 100 MG capsule Commonly known as:  NEURONTIN Take 100 mg by mouth 3 (three) times daily as needed (pain). Reported on 08/22/2015   guaiFENesin 600 MG 12 hr tablet Commonly known as:  MUCINEX Take 2 tablets (1,200 mg total) by mouth 2 (two) times daily.   hydrocerin Crea Apply Eucerin cream to BLE Q day after bathing and roughly towel drying to remove loose skin   HYDROcodone-acetaminophen 5-325 MG tablet Commonly known as:  NORCO/VICODIN Take 1 tablet by mouth every 8 (eight) hours as needed for severe pain. What changed:  when to take this  reasons to take this   polyethylene glycol packet Commonly known as:  MIRALAX / GLYCOLAX Take 17 g by mouth daily. Start taking on:  04/19/2016   predniSONE 10 MG tablet Commonly known as:  DELTASONE Take 4 tabs daily 2 days, then 3 tabs daily 2 days, then 2 tabs daily 2 days, then 1 tab daily 2 days, then stop. Start taking on:  04/19/2016   simethicone 80 MG chewable  tablet Commonly known as:  MYLICON Chew 80 mg by mouth every 6 (six) hours as needed for flatulence.   vitamin B-12 100 MCG tablet Commonly known as:  CYANOCOBALAMIN Take 100 mcg by mouth daily.       Contact information for follow-up providers    [name], MD. Schedule an appointment as soon as possible for a visit.   Specialty:  Internal Medicine Why:  Upon discharge from SNF. Contact information: [address] [place] [address] [phone]        M.D. at SNF. Schedule an appointment as soon as possible for a visit in 3 day(s).   Why:  To be seen with repeat labs (CBC & BMP).       Ardis Hughs, MD. Schedule an appointment as soon as possible for a visit in 4 week(s).   Specialty:  Urology Contact  information: [place] [address] [phone]        Volanda Napoleon, MD. Schedule an appointment as soon as possible for a visit.   Specialty:  Oncology Why:  MDs office will arrange follow-up in February 2018. SNF to coordinate. Contact information: [place] [address] [phone]            Contact information for after-discharge care    Destination    [name] Follow up.   Specialty:  [place] information: [address] [place] [phone]                 No Known Allergies  Consultations:  Cardiology  Palliative care team  Hematology/oncology  Nephrology  Urology   Procedures/Studies: Ct Abdomen Pelvis Wo Contrast  Result Date: 04/15/2016 CLINICAL DATA:  Evaluate hydronephrosis. EXAM: CT ABDOMEN AND PELVIS WITHOUT CONTRAST TECHNIQUE: Multidetector CT imaging of the abdomen and pelvis was performed following the standard protocol without IV contrast. COMPARISON:  None. FINDINGS: Lower chest: There is subsegmental atelectasis within the lung bases. There is also a mosaic attenuation pattern within the visualized portions of the lungs. No pleural effusion noted. Hepatobiliary: No focal liver abnormality is seen. No gallstones, gallbladder wall thickening, or biliary dilatation.There is subsegmental atelectasis noted in the lung bases. Pancreas: Unremarkable. No pancreatic ductal dilatation or surrounding inflammatory changes. Spleen: The spleen is enlarged measuring 15.4 cm in length. Adrenals/Urinary Tract: Normal appearance of the adrenal glands. Cyst within the inferior pole the right kidney is incompletely characterized without IV contrast measuring 3 cm. No renal calculi identified. Very mild bilateral pelvocaliectasis identified. No evidence for high-grade obstructive uropathy. The urinary bladder is collapsed around a Foley  catheter. Stomach/Bowel: The stomach is normal. The small bowel loops have a normal course and caliber. Numerous colonic diverticula identified. No evidence for acute diverticulitis. Moderate stool burden is noted within the rectum. Vascular/Lymphatic: Aortic atherosclerosis. No adenopathy identified within the abdomen or the pelvis. Reproductive: Prostate is unremarkable. Other: No free fluid or fluid collections. Musculoskeletal: The bones are diffusely sclerotic compatible with widespread osseous metastatic disease. IMPRESSION: 1. Interval decompression of bilateral renal collecting systems with mild residual pelvocaliectasis status post catheter placement. At this time there is no evidence for high-grade obstructive uropathy of either kidney. 2. Splenomegaly. 3. Diffuse sclerotic bone metastases. 4. Aortic atherosclerosis. Electronically Signed   By: Kerby Moors M.D.   On: 04/15/2016 10:25   Dg Chest 2 View  Result Date: 04/06/2016 CLINICAL DATA:  Shortness of breath.  Sore back radiating to both flanks. Discharge from [facility] on Saturday with diagnosis of pneumonia. Continued fever and chills. EXAM: CHEST  2 VIEW COMPARISON:  04/01/2016 FINDINGS: Shallow inspiration with linear atelectasis in the lung bases. Cardiac enlargement. Diffuse airspace opacities in the lungs consistent with edema or multifocal pneumonia. Similar appearance to previous study. No blunting of costophrenic angles. No pneumothorax. Calcified aorta. Thoracic scoliosis convex towards the right. IMPRESSION: Cardiac enlargement. Diffuse airspace disease suggesting edema or pneumonia. Electronically Signed   By: Lucienne Capers M.D.   On: 04/06/2016 23:55   Dg Chest 2 View  Result Date: 04/01/2016 CLINICAL DATA:  Acute onset of fever, cough and body aches. Initial encounter. EXAM: CHEST  2 VIEW COMPARISON:  Chest radiograph performed 12/29/2015 FINDINGS: The lungs are well-aerated. Vascular congestion is noted.  Bilateral central airspace opacities may reflect pneumonia or pulmonary edema. No pleural effusion or pneumothorax is seen. The heart is mildly enlarged. No acute osseous abnormalities are seen. IMPRESSION: Vascular congestion and mild cardiomegaly. Bilateral central airspace opacities may reflect pneumonia or pulmonary edema. Electronically Signed   By: Garald Balding M.D.   On: 04/01/2016 03:08   Dg Wrist Complete Right  Result Date: 04/15/2016 CLINICAL DATA:  Acute right wrist pain at the radial aspect of the wrist as well as the proximal second and third metacarpals for 1 day. Right arm weakness. Initial encounter. EXAM: RIGHT WRIST - COMPLETE 3+ VIEW COMPARISON:  None. FINDINGS: There is no evidence of acute fracture or dislocation. No focal osseous lesion or osseous erosion is seen. Joint space widths are preserved. No focal soft tissue abnormality is identified. IMPRESSION: No evidence of acute osseous abnormality. Electronically Signed   By: Logan Bores M.D.   On: 04/15/2016 19:25   Ct Chest Wo Contrast  Result Date: 04/01/2016 CLINICAL DATA:  Chest pain. Evaluate for aortic aneurysm. History of myeloproliferative disorder. EXAM: CT CHEST WITHOUT CONTRAST TECHNIQUE: Multidetector CT imaging of the chest was performed following the standard protocol without IV contrast. COMPARISON:  06/21/2014 FINDINGS: Cardiovascular: Ascending thoracic aorta measures up to 4.4 cm and stable. Atherosclerotic calcifications involving the posterior aortic arch. Proximal descending thoracic aorta measures 3.8 cm and stable. Mid descending thoracic aorta measures 3.4 cm and stable. Main pulmonary artery is markedly enlarged measuring 5 cm but stable. Mediastinum/Nodes: Small mediastinal lymph nodes have not significantly changed. There is a 1 cm hypodense nodule or cyst in the right thyroid lobe. There is subcutaneous edema in the chest. No significant pericardial fluid. Lungs/Pleura: Trachea and mainstem bronchi are  patent. Again noted is a mosaic attenuation pattern throughout the lungs. This pattern probably represents multiple areas of air trapping. These are prominent pulmonary vessels in the lung bases. Volume loss in the right lower lobe related to elevation of the right hemidiaphragm. There is some septal thickening in the upper lobes. No significant pleural effusions. Upper Abdomen: Again noted is an enlarged spleen which is only partially visualized. No acute abnormality in the upper abdomen. There is a high-density structure within the stomach lumen. Musculoskeletal: The visualized bones are diffusely sclerotic which is similar to the previous examination. There are small scattered lucent areas throughout the bones. Some of these lucent areas/lesions have enlarged from the prior examination. Lucent lesion along the posterior left sixth rib now measures 1.1 cm and previously measured 0.7 cm. No evidence for pathologic fracture. 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 Recommend annual imaging followup by CTA or MRA.  This recommendation follows 2010 ACCF/AHA/AATS/ACR/ASA/SCA/SCAI/SIR/STS/SVM Guidelines for the Diagnosis and Management of Patients with Thoracic Aortic Disease. Circulation. 2010; 121: P824-M353 Stable enlargement of pulmonary arteries compatible with pulmonary hypertension. Mosaic attenuation pattern throughout both lungs. Similar finding was present on the previous examination. This is probably related to a combination of air trapping and mild edema. Bones are diffusely sclerotic with enlarged lucent lesions. 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 Splenomegaly. This is probably related to the history of myeloproliferative disorder. Electronically Signed   By: Markus Daft M.D.   On: 04/01/2016 11:14   Nm Bone Scan Whole Body  Result Date: 04/17/2016 CLINICAL DATA:  Diffuse bone pain.  History of myelofibrosis. EXAM: NUCLEAR MEDICINE WHOLE  BODY BONE SCAN TECHNIQUE: Whole body anterior and posterior images were obtained approximately 3 hours after intravenous injection of radiopharmaceutical. RADIOPHARMACEUTICALS:  25.2 mCi Technetium-63mMDP IV COMPARISON:  Chest CT 04/01/2016.  CT abdomen and pelvis 04/14/2016. FINDINGS: There is diffusely increased radiotracer uptake throughout nearly the entirety of the axial and visualized appendicular skeleton with relative sparing of the skull. Symmetrically increased uptake is present about the hips, knees, ankles, and shoulders. The kidneys are only faintly visualized without evidence of significant radiotracer excretion into the collecting system or bladder, and this may reflect a combination of renal failure and the diffuse osseous tracer uptake. IMPRESSION: Diffusely abnormal uptake throughout the skeleton which is compatible with the patient's history of myelofibrosis. Diffuse osseous metastatic disease from a non-hematologic primary malignancy and metabolic bone diseases can also give this appearance. Electronically Signed   By: ALogan BoresM.D.   On: 04/17/2016 15:12   [place]al  Result Date: 04/13/2016 CLINICAL DATA:  Acute on chronic renal failure EXAM: RENAL / URINARY TRACT ULTRASOUND COMPLETE COMPARISON:  04/03/2014 FINDINGS: Right Kidney: Length: 11.1 cm. Increased echotexture. 2.8 cm lower pole cyst. Mild right hydronephrosis. Left Kidney: Length: 12.7 cm. Increased echotexture. There is severe left hydronephrosis. 1.4 cm echogenic, shadowing area in the lower pole, likely nonobstructing left renal stone. Bladder: Multiple large cystic areas within the pelvis which appear to be contiguous with the bladder compatible with diverticula and trabeculations, stable. IMPRESSION: Mild right hydronephrosis and severe left hydronephrosis. 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 Probable 14 mm nonobstructing left lower pole renal stone. Increased echotexture in the  kidneys compatible with chronic medical renal disease. Electronically Signed   By: KRolm BaptiseM.D.   On: 04/13/2016 13:01   Dg Chest Port 1 View  Result Date: 04/10/2016 CLINICAL DATA:  Acute respiratory failure with hypoxia. Pulmonary edema versus other infectious or leukemic infiltrates. EXAM: PORTABLE CHEST 1 VIEW COMPARISON:  Chest x-rays dated 04/06/2016, 04/01/2016, 12/29/2015 and 01/21/2015. FINDINGS: No significant change compared to the most recent chest x-ray of 04/06/2016, with persistent bilateral perihilar and bibasilar opacities compatible with either pulmonary edema or multifocal pneumonia. No new lung findings. No pleural effusions seen. Heart size and mediastinal contours are stable. Atherosclerotic changes again noted at the aortic arch. Osseous structures appear mottled, particularly about each shoulder, compatible with the mixed sclerotic and lucent lesions described on earlier chest CT of 04/01/2016. IMPRESSION: 1. Persistent perihilar and bibasilar airspace opacities, unchanged compared to the most recent chest x-ray of 04/06/2016, compatible with either pulmonary edema or multifocal pneumonia, favor pulmonary edema. No new lung findings. 2. Mottled appearance of the osseous structures, particularly prominent about each shoulder, compatible with the mixed sclerotic and lucent lesions described on chest CT of 04/01/2016, highly suspicious for  metastatic disease. 3. Aortic atherosclerosis. Electronically Signed   By: Franki Cabot M.D.   On: 04/10/2016 19:06      Subjective: Right wrist pain significantly improved. Able to lift up his upper extremity from the bed where he was not able to even move his right wrist a couple days ago secondary to pain. Denies any other complaints. No dyspnea, chest pain.  Discharge Exam:  Vitals:   04/17/16 1816 04/17/16 2027 04/18/16 0435 04/18/16 1237  BP: 118/64 126/61 130/74 122/64  Pulse: 71 70 78 83  Resp: '18 18 18 18  '$ Temp:  97.5 F  (36.4 C) 98.1 F (36.7 C) 98.5 F (36.9 C)  TempSrc:  Oral Oral Oral  SpO2: 95% 98% 96% 97%  Weight:   87.3 kg (192 lb 6.4 oz)   Height:        General: Pleasant elderly male, chronically ill-looking, lying comfortably propped up in bed without distress. Cardiovascular: S1 & S2 heard, RRR, S1/S2 +. No murmurs, rubs, gallops or clicks. No JVD or pedal edema. Respiratory: Diminished breath sounds in the bases but otherwise clear to auscultation without wheezing, rhonchi or crackles. No increased work of breathing. Abdominal:  Non distended, non tender & soft. No organomegaly or masses appreciated. Normal bowel sounds heard. Indwelling Foley catheter. No growth GERD CNS: Alert and oriented to person and place. No focal deficits. Extremities: Right wrist has significantly improved with no further increased warmth or swelling. Mildly tender and significantly improved and less painful range of movements. Lower extremity with chronic thickening/lichenification of skin.    The results of significant diagnostics from this hospitalization (including imaging, microbiology, ancillary and laboratory) are listed below for reference.     Microbiology: Recent Results (from the past 240 hour(s))  Urine culture     Status: None   Collection Time: 04/14/16  2:39 PM  Result Value Ref Range Status   Specimen Description URINE, CATHETERIZED  Final   Special Requests NONE  Final   Culture NO GROWTH  Final   Report Status 04/15/2016 FINAL  Final     Labs: BNP (last 3 results)  Recent Labs  12/29/15 1607 04/01/16 0426 04/07/16 0010  BNP 228.8* 325.1* 169.6*   Basic Metabolic Panel:  Recent Labs Lab 04/13/16 0521 04/14/16 0350 04/15/16 0356 04/16/16 1024 04/17/16 0333  NA 134* 135 140 140 138  K 4.1 3.4* 3.7 4.0 4.5  CL 98* 101 104 103 101  CO2 21* '23 25 26 27  '$ GLUCOSE 108* 104* 111* 117* 138*  BUN 104* 106* 102* 94* 92*  CREATININE 3.10* 2.76* 2.20* 1.72* 1.53*  CALCIUM 9.2 8.9 9.4  9.2 9.2  PHOS  --  4.5 4.3 3.5 3.6   Liver Function Tests:  Recent Labs Lab 04/14/16 0350 04/15/16 0356 04/16/16 1024 04/17/16 0333  AST 25  --   --   --   ALT 12*  --   --   --   ALKPHOS 82  --   --   --   BILITOT 0.7  --   --   --   PROT 5.1*  --   --   --   ALBUMIN 2.1* 2.1* 2.2* 1.9*   No results for input(s): LIPASE, AMYLASE in the last 168 hours. No results for input(s): AMMONIA in the last 168 hours. CBC:  Recent Labs Lab 04/13/16 0521 04/14/16 0350 04/15/16 0356 04/17/16 0334  WBC 18.7* 18.4* 22.5* 30.9*  NEUTROABS  --   --  14.9*  --  HGB 8.4* 7.4* 7.9* 8.0*  HCT 26.2* 22.9* 24.7* 25.8*  MCV 77.1* 76.6* 78.4 78.9  PLT 571* 491* 565* 714*   Urinalysis    Component Value Date/Time   COLORURINE YELLOW 04/13/2016 1714   APPEARANCEUR CLOUDY (A) 04/13/2016 1714   LABSPEC 1.016 04/13/2016 1714   PHURINE 5.0 04/13/2016 1714   GLUCOSEU NEGATIVE 04/13/2016 1714   GLUCOSEU NEGATIVE 05/02/2015 1008   HGBUR NEGATIVE 04/13/2016 1714   BILIRUBINUR NEGATIVE 04/13/2016 1714   KETONESUR NEGATIVE 04/13/2016 1714   PROTEINUR 30 (A) 04/13/2016 1714   UROBILINOGEN 0.2 05/02/2015 1008   NITRITE NEGATIVE 04/13/2016 1714   LEUKOCYTESUR TRACE (A) 04/13/2016 1714     Time coordinating discharge: Over 30 minutes  SIGNED:  Vernell Leep, MD, FACP, FHM. [facility] Pager [phone]  If 7PM-7AM, please contact night-coverage [email or web address] Password [facility] 04/18/2016, 2:41 PM

## 2016-04-18 NOTE — Progress Notes (Signed)
R/t discharge to [facility], IV has been removed without complication.  Pt was already non-telemetry.  Pt has foley that will remain in place r/t kidney obstruction, as per order.  Pt is dressed, and ready to be discharged. Attempted report at [facility] at [phone], and spoke to [facility], who was unable to take report at this time.  Lauren states that she will call me back when she is able to take report.

## 2016-04-18 NOTE — Progress Notes (Signed)
CSW received call that pt is ready for DC. CSW contacted facility [facility], pt can come to facility today anytime. CSW updated nurse, [facility] transport set up for 230PM. Pt informed, family contacted(dtr Vernell Barrier). DC Summary/Trnsfr Summary faxed to facility. Pt has no other needs at this time.  CSW is signing off.  Querida Beretta B. Joline Maxcy Clinical Social Work Dept Weekend Social Worker [phone] 12:43 PM

## 2016-04-18 NOTE — Clinical Social Work Placement (Signed)
   CLINICAL SOCIAL WORK PLACEMENT  NOTE  Date:  04/18/2016  Patient Details  Name: Juan [name] MRN: [id number] Date of Birth: October 22, 1940  Clinical Social Work is seeking post-discharge placement for this patient at the [place] level of care (*CSW will initial, date and re-position this form in  chart as items are completed):  Yes   Patient/family provided with [place] Work Department's list of facilities offering this level of care within the geographic area requested by the patient (or if unable, by the patient's family).  Yes   Patient/family informed of their freedom to choose among providers that offer the needed level of care, that participate in Medicare, Medicaid or managed care program needed by the patient, have an available bed and are willing to accept the patient.  Yes   Patient/family informed of [facility]'s ownership interest in [facility] and [facility], as well as of the fact that they are under no obligation to receive care at these facilities.  PASRR submitted to EDS on 04/08/16     PASRR number received on       Existing PASRR number confirmed on 04/08/16     FL2 transmitted to all facilities in geographic area requested by pt/family on 04/08/16     FL2 transmitted to all facilities within larger geographic area on       Patient informed that his/her managed care company has contracts with or will negotiate with certain facilities, including the following:        Yes   Patient/family informed of bed offers received.  Patient chooses bed at  [facility])     Physician recommends and patient chooses bed at      Patient to be transferred to  ([facility]) on 04/18/16.  Patient to be transferred to facility by  [facility])     Patient family notified on 04/18/16 of transfer.  Name of family member notified:  Vernell Barrier     PHYSICIAN Please sign FL2     Additional Comment:     _______________________________________________ Serafina Mitchell, LCSWA 04/18/2016, 12:33 PM

## 2016-04-18 NOTE — Discharge Instructions (Signed)
Acute Kidney Injury, Adult Acute kidney injury (AKI) occurs when there is sudden (acute) damage to the kidneys.A small amount of kidney damage may not cause problems, but a large amount of damage may make it difficult or impossible for the kidneys to work the way they should. AKI may develop into long-lasting (chronic) kidney disease. Early detection and treatment of AKI may prevent kidney damage from becoming permanent or getting worse. What are the causes? Common causes of this condition include:  A problem with blood flow to the kidneys. This may be caused by:  Blood loss.  Heart and blood vessel (cardiovascular) disease.  Severe burns.  Liver disease.  Direct damage to the kidneys. This may be caused by:  Some medicines.  A kidney infection.  Poisoning.  Being around or in contact with poisonous (toxic) substances.  A surgical wound.  A hard, direct force to the kidney area.  A sudden blockage of urine flow. This may be caused by:  Cancer.  Kidney stones.  Enlarged prostate in males. What are the signs or symptoms? Symptoms develop slowly and may not be obvious until the kidney damage becomes severe. It is possible to have AKI for years without showing any symptoms. Symptoms of this condition can include:  Swelling (edema) of the face, legs, ankles, or feet.  Numbness, tingling, or loss of feeling (sensation) in the hands or feet.  Tiredness (lethargy).  Nausea or vomiting.  Confusion or trouble concentrating.  Problems with urination, such as:  Painful or burning feeling during urination.  Decreased urine production.  Frequent urination, especially at night.  Bloody urine.  Muscle twitches and cramps, especially in the legs.  Shortness of breath.  Weakness.  Constant itchiness.  Loss of appetite.  Metallic taste in the mouth.  Trouble sleeping.  Pale lining of the eyelids and surface of the eye (conjunctiva). How is this diagnosed? This  condition may be diagnosed with various tests. Tests may include:  Blood tests.  Urine tests.  Imaging tests.  A test in which a sample of tissue is removed from the kidneys to be looked at under a microscope (kidney biopsy). How is this treated? Treatment of AKIvaries depending on the cause and severity of the kidney damage. In mild cases, treatment may not be needed. The kidneys may heal on their own. If AKI is more severe, your health care provider will treat the cause of the kidney damage, help the kidneys heal, and prevent problems from occurring. Severe cases may require a procedure to remove toxic wastes from the body (dialysis) or surgery to repair kidney damage. Surgery may involve:  Repair of a torn kidney.  Removal of a urine flow obstruction. Follow these instructions at home:  Follow your prescribed diet.  Take over-the-counter and prescription medicines only as told by your health care provider.  Do not take any new medicines unless approved by your health care provider. Many medicines can worsen your kidney damage.  Do not take any vitamin and mineral supplements unless approved by your health care provider. Many nutritional supplements can worsen your kidney damage.  The dose of some medicines that you take may need to be adjusted.  Do not use any tobacco products, such as cigarettes, chewing tobacco, and e-cigarettes. If you need help quitting, ask your health care provider.  Keep all follow-up visits as told by your health care provider. This is important.  Keep track of your blood pressure. Report changes in your blood pressure as told by your  health care provider.  Achieve and maintain a healthy weight. If you need help with this, ask your health care provider.  Start or continue an exercise plan. Try to exercise at least 30 minutes a day, 5 days a week.  Stay current with immunizations as told by your health care provider. Where to find more  information:  American Association of Kidney Patients: BombTimer.gl  National Kidney Foundation: www.kidney.[place]: https://mathis.com/  [employer] [facility]: www.lifeoptions.org and www.kidneyschool.org Contact a health care provider if:  Your symptoms get worse.  You develop new symptoms. Get help right away if:  You develop symptoms of end-stage kidney disease, which include:  Headaches.  Abnormally dark or light skin.  Numbness in the hands or feet.  Easy bruising.  Frequent hiccups.  Chest pain.  Shortness of breath.  End of menstruation in women.  You have a fever.  You have decreased urine production.  You have pain or bleeding when you urinate. This information is not intended to replace advice given to you by your health care provider. Make sure you discuss any questions you have with your health care provider. Document Released: 11/17/2010 Document Revised: 12/12/2015 Document Reviewed: 01/01/2012 Elsevier Interactive Patient Education  2017 Elsevier Inc.   Hydronephrosis Introduction Hydronephrosis is the enlargement of a kidney due to a blockage that stops urine from flowing out of the body. What are the causes? Common causes of this condition include:  A birth (congenital) defect of the kidney.  A congenital defect of the tube through which urine travels (ureter).  Kidney stones.  An enlarged prostate gland.  A tumor.  Cancer of the prostate, bladder, uterus, ovary, or colon.  A blood clot. What are the signs or symptoms? Symptoms of this condition include:  Pain or discomfort in your side (flank).  Swelling of the abdomen.  Pain in the abdomen.  Nausea and vomiting.  Fever.  Pain while passing urine.  Feeling of urgency to urinate.  Frequent urination.  Infection of the urinary tract. In some cases, there are no symptoms. How is this diagnosed? This condition may be diagnosed with:  A  medical history.  A physical exam.  Blood and urine tests to check kidney function.  Imaging tests, such as an X-ray, ultrasound, CT scan, or MRI.  A test in which a rigid or flexible telescope (cystoscope) is used to view the site of the blockage. How is this treated? Treatment for this condition depends on where the blockage is located, how long it has been there, and what caused it. The goal of treatment is to remove the blockage. Treatment options include:  A procedure to put in a soft tube to help drain urine.  Antibiotic medicines to treat or prevent infection.  Shock-wave therapy (lithotripsy) to help eliminate kidney stones. Follow these instructions at home:  Get lots of rest.  Drink enough fluid to keep your urine clear or pale yellow.  If you have a drain in, follow your health care provider's instructions about how to care for it.  Take medicines only as directed by your health care provider.  If you were prescribed an antibiotic medicine, finish all of it even if you start to feel better.  Keep all follow-up visits as directed by your health care provider. This is important. Contact a health care provider if:  You continue to have symptoms after treatment.  You develop new symptoms.  You have a problem with a drainage device.  Your urine  becomes cloudy or bloody.  You have a fever. Get help right away if:  You have severe flank or abdominal pain.  You develop vomiting and are unable to keep fluids down. This information is not intended to replace advice given to you by your health care provider. Make sure you discuss any questions you have with your health care provider. Document Released: 03/01/2007 Document Revised: 10/10/2015 Document Reviewed: 04/30/2014  2017 Elsevier

## 2016-04-20 ENCOUNTER — Encounter: Payer: Self-pay | Admitting: Internal Medicine

## 2016-04-20 ENCOUNTER — Non-Acute Institutional Stay (SKILLED_NURSING_FACILITY): Payer: Medicare Other | Admitting: Internal Medicine

## 2016-04-20 DIAGNOSIS — E790 Hyperuricemia without signs of inflammatory arthritis and tophaceous disease: Secondary | ICD-10-CM | POA: Diagnosis not present

## 2016-04-20 DIAGNOSIS — D508 Other iron deficiency anemias: Secondary | ICD-10-CM

## 2016-04-20 DIAGNOSIS — N179 Acute kidney failure, unspecified: Secondary | ICD-10-CM

## 2016-04-20 DIAGNOSIS — E538 Deficiency of other specified B group vitamins: Secondary | ICD-10-CM

## 2016-04-20 DIAGNOSIS — I471 Supraventricular tachycardia, unspecified: Secondary | ICD-10-CM

## 2016-04-20 DIAGNOSIS — D471 Chronic myeloproliferative disease: Secondary | ICD-10-CM

## 2016-04-20 DIAGNOSIS — Z794 Long term (current) use of insulin: Secondary | ICD-10-CM

## 2016-04-20 DIAGNOSIS — N189 Chronic kidney disease, unspecified: Secondary | ICD-10-CM

## 2016-04-20 DIAGNOSIS — E1142 Type 2 diabetes mellitus with diabetic polyneuropathy: Secondary | ICD-10-CM

## 2016-04-20 DIAGNOSIS — D473 Essential (hemorrhagic) thrombocythemia: Secondary | ICD-10-CM

## 2016-04-20 DIAGNOSIS — N1330 Unspecified hydronephrosis: Secondary | ICD-10-CM

## 2016-04-20 DIAGNOSIS — I5043 Acute on chronic combined systolic (congestive) and diastolic (congestive) heart failure: Secondary | ICD-10-CM

## 2016-04-20 DIAGNOSIS — E1340 Other specified diabetes mellitus with diabetic neuropathy, unspecified: Secondary | ICD-10-CM

## 2016-04-20 DIAGNOSIS — M109 Gout, unspecified: Secondary | ICD-10-CM | POA: Insufficient documentation

## 2016-04-20 DIAGNOSIS — D72829 Elevated white blood cell count, unspecified: Secondary | ICD-10-CM

## 2016-04-20 DIAGNOSIS — D75839 Thrombocytosis, unspecified: Secondary | ICD-10-CM

## 2016-04-20 DIAGNOSIS — J3089 Other allergic rhinitis: Secondary | ICD-10-CM

## 2016-04-20 DIAGNOSIS — M10031 Idiopathic gout, right wrist: Secondary | ICD-10-CM

## 2016-04-20 DIAGNOSIS — E119 Type 2 diabetes mellitus without complications: Secondary | ICD-10-CM | POA: Insufficient documentation

## 2016-04-20 NOTE — Progress Notes (Signed)
: Provider:  Noah Delaine. [name], MD Location:  [facility] Room Number: [phone] Place of Service:  SNF [phone])  PCP: [name], MD Patient Care Team: Colon Branch, MD as PCP - General Roxana Hires, MD as Consulting Physician (Hematology and Oncology) [name], MD as Consulting Physician (Cardiology) [name], MD as Consulting Physician (Hematology and Oncology)  Extended Emergency Contact Information Primary Emergency Contact: Cheyney,Carolyn Address: [place], [place] Mobile Phone: [phone] Relation: Daughter     Allergies: Patient has no known allergies.  Chief Complaint  Patient presents with  . New Admit To SNF    Admit to Facility    HPI: Patient is 75 y.o. 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 Pt was treated with IV then po lasix. Hospital course was complicated by acute on CKD3, 2/2 to bladder outlet obstruction and treated with a foley and anemia, tx with IV iron and erythropoietin. Pt with leukocytosis and thrombocytosis c/w myeloproliferative dx. Pt is admitted to SNF with generalized weakness for OT/PT. While at SNF pt will be followed for AR, tx with flonase, polyneuropathy, tx with neurontin and Vit B12 def, tx with replacement.  Past Medical History:  Diagnosis Date  . Allergic rhinitis   . Anemia   . 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 CAD (coronary artery disease)    dx elsewheer in past, no documentation. Non-ischemic myoview 2007  . Chronic diastolic CHF (congestive heart failure), NYHA class 2 (HCC)    Normal EF w/ grade 1 dd by echo 12/2015  . Edema    R>L leg, u/s 5-12 neg for DVT  . Hemorrhoid   . History of thrombocytosis   . Hypertension   . Migraine    "once/wk at least" (07/11/2013)  . Myeloproliferative disease ([place])   . Shortness of breath   . Sinus congestion   . Sleep apnea, obstructive    at some point used CPAP, was d/c  years ago  . Type II diabetes mellitus ([place])     Past Surgical History:  Procedure Laterality Date  . TOE SURGERY Right    "tried to straighten out big toe" (07/11/2013)      Medication List       Accurate as of 04/20/16  9:45 AM. Always use your most recent med list.          acetaminophen 500 MG tablet Commonly known as:  TYLENOL Take 1 tablet (500 mg total) by mouth every 6 (six) hours as needed for mild pain, moderate pain, fever or headache. Total acetaminophen dose from all sources not to exceed 4 g per day.   allopurinol 100 MG tablet Commonly known as:  ZYLOPRIM Take 1 tablet (100 mg total) by mouth daily.   aspirin 325 MG tablet Take 1 tablet (325 mg total) by mouth daily.   bisacodyl 10 MG suppository Commonly known as:  DULCOLAX Place 1 suppository (10 mg total) rectally daily as needed for moderate constipation.   carvedilol 3.125 MG tablet Commonly known as:  COREG Take 1 tablet (3.125 mg total) by mouth 2 (two) times daily.   feeding supplement (GLUCERNA SHAKE) Liqd Take 237 mLs by mouth daily. Reported on 08/21/2015   ferrous gluconate 225 (27 Fe) MG tablet Commonly known as:  FERGON Take 240 mg by mouth daily at 10 pm.   fluticasone 50 MCG/ACT nasal spray Commonly known as:  FLONASE Place 2 sprays into both nostrils daily. Reported on 05/02/2015   gabapentin 100 MG capsule Commonly known as:   NEURONTIN Take 100 mg by mouth 3 (three) times daily as needed (pain). Reported on 08/22/2015   guaiFENesin 600 MG 12 hr tablet Commonly known as:  MUCINEX Take 2 tablets (1,200 mg total) by mouth 2 (two) times daily.   hydrocerin Crea Apply Eucerin cream to BLE Q day after bathing and roughly towel drying to remove loose skin   HYDROcodone-acetaminophen 5-325 MG tablet Commonly known as:  NORCO/VICODIN Take 1 tablet by mouth every 8 (eight) hours as needed for severe pain.   polyethylene glycol packet Commonly known as:  MIRALAX / GLYCOLAX Take 17 g by mouth daily.   predniSONE 10 MG tablet Commonly known as:  DELTASONE Take 4 tabs daily 2 days, then 3 tabs daily 2 days, then 2 tabs daily 2 days, then 1 tab daily 2 days, then stop.   simethicone 80 MG chewable tablet Commonly known as:  MYLICON Chew 80 mg by mouth every 6 (six) hours as needed for flatulence.   vitamin B-12 100 MCG tablet Commonly known as:  CYANOCOBALAMIN Take 100 mcg by mouth daily.       No orders of the defined types were placed in this encounter.   Immunization History  Administered Date(s) Administered  . Influenza Split 02/25/2011  . Influenza Whole 03/17/2007, 04/25/2009  . Influenza, High Dose Seasonal PF 04/12/2013  . Influenza, Seasonal, Injecte, Preservative Fre 05/05/2012  . Influenza,inj,Quad PF,36+ Mos 01/26/2014, 01/22/2015, 04/04/2016  . Pneumococcal Conjugate-13 10/09/2014  . Pneumococcal Polysaccharide-23 01/14/2012, 03/31/2014  . Td 08/29/2009    Social History  Substance Use Topics  . Smoking status: Former Smoker    Packs/day: 0.25    Years: 12.00    Types: Cigarettes    Quit date: 05/18/1966  . Smokeless tobacco: Never Used     Comment: quit smoking 45 years ago  . Alcohol use No    Family history is   Family History  Problem Relation Age of Onset  . Schizophrenia Son   . Colon cancer Neg Hx   . Prostate cancer Neg Hx   . Heart attack Neg Hx   . Diabetes Neg  Hx       Review of Systems  DATA OBTAINED: from patient GENERAL:  no fevers, fatigue, appetite changes SKIN: No itching, or rash EYES: No eye pain, redness, discharge EARS: No earache, tinnitus, change in hearing NOSE: No congestion, drainage or bleeding  MOUTH/THROAT: No mouth or tooth pain, No sore throat RESPIRATORY: No cough, wheezing, SOB CARDIAC: No chest pain, palpitations, lower extremity edema  GI: No abdominal pain, No N/V/D or constipation, No heartburn or reflux  GU: No dysuria, frequency or urgency, or incontinence  MUSCULOSKELETAL: No unrelieved bone/joint pain NEUROLOGIC: No headache, dizziness or focal weakness PSYCHIATRIC: No c/o anxiety or sadness   Vitals:   04/20/16 0921  BP: 107/60  Pulse: 70  Resp: (!) 22  Temp: 98.1 F (36.7 C)    SpO2 Readings from Last 1 Encounters:  04/20/16 90%   Body mass index is 24.06 kg/m.     Physical Exam  GENERAL APPEARANCE: Alert, conversant,  No acute distress.  SKIN: No diaphoresis rash HEAD: Normocephalic, atraumatic  EYES: Conjunctiva/lids clear. Pupils round, reactive. EOMs intact.  EARS: External exam WNL, canals clear. Hearing grossly normal.  NOSE: No deformity or discharge.  MOUTH/THROAT: Lips w/o lesions  RESPIRATORY: Breathing is even, unlabored. Lung sounds are clear   CARDIOVASCULAR: Heart RRR no murmurs, rubs or gallops. trace peripheral edema.   GASTROINTESTINAL: Abdomen is soft, non-tender, not distended w/ normal bowel sounds. GENITOURINARY: Bladder non tender, not distended  MUSCULOSKELETAL: No abnormal joints or musculature NEUROLOGIC:  Cranial nerves 2-12 grossly intact. Moves all extremities  PSYCHIATRIC: Mood and affect appropriate to situation, no behavioral issues  Patient Active Problem List   Diagnosis Date Noted  . Constipation   . Acute on chronic renal failure ([place])   . Hydronephrosis   . Systolic and diastolic CHF, acute on chronic ([place]) 04/08/2016  . Acute respiratory  failure with hypoxia ([place]) 04/08/2016  . CHF exacerbation ([place]) 04/07/2016  . Acute renal failure superimposed on chronic kidney disease ([place]) 04/07/2016  . Thrombocytosis ([place]) 04/07/2016  . Anemia 04/07/2016  . Community acquired pneumonia 04/01/2016  . Pneumonia 04/01/2016  . Pressure injury of skin 04/01/2016  . Pressure ulcer stage II 12/31/2015  . UTI (urinary tract infection) 12/30/2015  . Sepsis ([place]) 12/29/2015  . UTI (lower urinary tract infection) 12/29/2015  . T wave inversion in EKG 12/29/2015  . Goals of care, counseling/discussion 05/02/2015  . Acute on chronic systolic congestive heart failure ([place]) 02/14/2015  . CKD (chronic kidney disease), stage III 01/22/2015  . Pain in limb   . Peripheral edema   . Unable to ambulate   . Thoracic aortic aneurysm ([place]) 04/26/2014  . CKD (chronic kidney disease) stage 3, GFR 30-59 ml/min 04/26/2014  . 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 Chest pain 04/15/2014  . Encounter for palliative care 04/06/2014  . Weakness generalized 04/06/2014  . Venous stasis dermatitis of both lower extremities   . Malnutrition of moderate degree ([place]) 03/31/2014  . Morbid obesity due to excess calories ([place]) 03/29/2014  . 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 Leukocytosis 10/17/2013  . Poor compliance with advise  05/05/2012  . Annual physical exam 01/14/2012  . Weight loss 01/14/2012  . BPH (benign prostatic hyperplasia) 01/14/2012  . *Chronic lower extremity edema, stasis dermatitis 06/09/2011  . CARDIOMEGALY 04/29/2009  . *Prediabetes 09/07/2006  . Symptomatic anemia 09/07/2006  . Myeloproliferative disease ([place]) 09/07/2006  . Obstructive sleep apnea 09/07/2006  . Essential hypertension 09/07/2006  . Coronary atherosclerosis 09/07/2006  . *CHF-- PCP comments 09/07/2006  . ALLERGIC RHINITIS 09/07/2006      Labs reviewed: Basic Metabolic Panel:    Component Value Date/Time   NA 138 04/17/2016 0334    NA 143 07/30/2014 1202   NA 138 11/16/2013 0847   K 4.5 04/17/2016 0333   K 5.1 (H) 07/30/2014 1202   K 4.5 11/16/2013 0847   CL 101 04/17/2016 0333   CL 105 07/30/2014 1202   CO2 27 04/17/2016 0333   CO2 28 07/30/2014 1202   CO2 22 11/16/2013 0847   GLUCOSE 138 (H) 04/17/2016 0333   GLUCOSE 109 07/30/2014 1202   BUN 5 04/17/2016 0334   BUN 21 07/30/2014 1202   BUN 20.2 11/16/2013 0847  CREATININE 1.5 (A) 04/17/2016 0334   CREATININE 1.53 (H) 04/17/2016 0333   CREATININE 1.3 (H) 07/30/2014 1202   CREATININE 1.2 11/16/2013 0847   CALCIUM 9.2 04/17/2016 0333   CALCIUM 9.6 07/30/2014 1202   CALCIUM 9.8 11/16/2013 0847   PROT 5.1 (L) 04/14/2016 0350   PROT 7.0 07/30/2014 1202   PROT 6.7 11/16/2013 0847   ALBUMIN 1.9 (L) 04/17/2016 0333   ALBUMIN 3.2 (L) 07/30/2014 1202   ALBUMIN 3.4 (L) 11/16/2013 0847   AST 25 04/14/2016 0350   AST 30 07/30/2014 1202   AST 25 11/16/2013 0847   ALT 12 (L) 04/14/2016 0350   ALT 10 07/30/2014 1202   ALT 11 11/16/2013 0847   ALKPHOS 82 04/14/2016 0350   ALKPHOS 88 (H) 07/30/2014 1202   ALKPHOS 125 11/16/2013 0847   BILITOT 0.7 04/14/2016 0350   BILITOT 0.50 07/30/2014 1202   BILITOT 0.35 11/16/2013 0847   GFRNONAA 43 (L) 04/17/2016 0333   GFRAA 50 (L) 04/17/2016 0333     Recent Labs  04/15/16 0356 04/16/16 1024 04/17/16 0333 04/17/16 0334  NA 140  140 140 138 138  K 3.7  3.7 4.0 4.5  --   CL 104 103 101  --   CO2 '25 26 27  '$ --   GLUCOSE 111* 117* 138*  --   BUN 102*  102* 94* 92* 5  CREATININE 2.20*  2.2* 1.72* 1.53* 1.5*  CALCIUM 9.4 9.2 9.2  --   PHOS 4.3 3.5 3.6  --    Liver Function Tests:  Recent Labs  04/01/16 0826 04/07/16 0010 04/14/16 0350 04/15/16 0356 04/16/16 1024 04/17/16 0333  AST '30  30 31  31 25  '$ --   --   --   ALT 14*  14 12*  12 12*  --   --   --   ALKPHOS 78  78 76  76 82  --   --   --   BILITOT 0.4 0.3 0.7  --   --   --   PROT 5.9* 6.3* 5.1*  --   --   --   ALBUMIN 3.0* 3.0* 2.1* 2.1*  2.2* 1.9*   No results for input(s): LIPASE, AMYLASE in the last 8760 hours. No results for input(s): AMMONIA in the last 8760 hours. CBC:  Recent Labs  04/10/16 0528 04/11/16 0336  04/14/16 0350 04/15/16 0356 04/17/16 0334  WBC 14.3* 15.0*  < > 18.4  18.4* 22.5  22.5* 30.9  30.9*  NEUTROABS 9.2* 9.5*  --   --  14.9*  --   HGB 8.7* 8.9*  < > 7.4*  7.4* 7.9*  7.9* 8.0*  HCT 26.5* 27.2*  < > 22.9*  23* 24.7*  25* 25.8*  MCV 77.7* 76.8*  < > 76.6* 78.4 78.9  PLT 575* 498*  < > 491*  491* 565*  565* 714*  < > = values in this interval not displayed. Lipid  Recent Labs  12/26/15 1036  CHOL 129  HDL 38.10*  LDLCALC 73  TRIG 86.0    Cardiac Enzymes:  Recent Labs  04/07/16 0532 04/07/16 1148 04/07/16 1821  TROPONINI <0.03 0.10* <0.03   BNP:  Recent Labs  12/29/15 1607 04/01/16 0426 04/07/16 0010  BNP 228.8* 325.1* 137.1*   Lab Results  Component Value Date   MICROALBUR 0.5 05/31/2008   Lab Results  Component Value Date   HGBA1C 6.1 12/26/2015   Lab Results  Component Value Date   TSH 2.49 08/22/2015  Lab Results  Component Value Date   EGBTDVVO16 0,737 (H) 04/09/2016   Lab Results  Component Value Date   FOLATE 56.7 04/09/2016   Lab Results  Component Value Date   IRON 6 (L) 04/09/2016   TIBC 167 (L) 04/09/2016   FERRITIN 417 (H) 04/09/2016    Imaging and Procedures obtained prior to SNF admission: Dg Chest 2 View  Result Date: 04/06/2016 CLINICAL DATA:  Shortness of breath. Sore back radiating to both flanks. Discharge from [facility] on Saturday with diagnosis of pneumonia. Continued fever and chills. EXAM: CHEST  2 VIEW COMPARISON:  04/01/2016 FINDINGS: Shallow inspiration with linear atelectasis in the lung bases. Cardiac enlargement. Diffuse airspace opacities in the lungs consistent with edema or multifocal pneumonia. Similar appearance to previous study. No blunting of costophrenic angles. No pneumothorax. Calcified  aorta. Thoracic scoliosis convex towards the right. IMPRESSION: Cardiac enlargement. Diffuse airspace disease suggesting edema or pneumonia. Electronically Signed   By: Lucienne Capers M.D.   On: 04/06/2016 23:55     Not all labs, radiology exams or other studies done during hospitalization come through on my EPIC note; however they are reviewed by me.    Assessment and Plan  ACUTE ON CHRONIC COMBINED CHF -  2-D echo 12/24/15: LVEF 55-60 percent, grade 1 diastolic dysfunction. Admission chest x-ray and clinical picture were suspicious for decompensated CHF. He was initially treated with IV Lasix and clinically improved. Euvolemic. Advanced heart failure team subsequently saw him and advised to discontinue diuretics and only use as needed. Hydralazine and nitrates were also discontinued due to hypotension issues. 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 Nephrology assisted with management and signed off. Patient is not a candidate for hemodialysis. 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 They recommend continuing Foley catheter, change every 4 weeks and outpatient follow-up in 4 weeks.  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 Treated with oral prednisone with good clinical improvement; allopurinol started SNF - cont prednisone taper and cont allopurinol  AR SNF - cont flonase  POLYNEUROPATHY SNF - cont neurontin 100 mg TID, change from prn to scheduled  VIT B 12 SNF - cont rplacement 100 mcg daily     Time spent Z> 45 min;> 50% of time with patient was spent reviewing records, labs, tests and studies, counseling and developing plan of care  Webb Silversmith D. [name], MD

## 2016-04-21 LAB — BASIC METABOLIC PANEL
BUN: 75 mg/dL — AB (ref 4–21)
CREATININE: 1.3 mg/dL (ref 0.6–1.3)
Glucose: 109 mg/dL
POTASSIUM: 5.7 mmol/L — AB (ref 3.4–5.3)
SODIUM: 139 mmol/L (ref 137–147)

## 2016-04-21 LAB — CBC AND DIFFERENTIAL
HCT: 27 % — AB (ref 41–53)
HEMOGLOBIN: 8.7 g/dL — AB (ref 13.5–17.5)
Platelets: 653 10*3/uL — AB (ref 150–399)
WBC: 29.3 10*3/mL

## 2016-04-23 ENCOUNTER — Encounter: Payer: Self-pay | Admitting: Internal Medicine

## 2016-04-23 ENCOUNTER — Non-Acute Institutional Stay (SKILLED_NURSING_FACILITY): Payer: Medicare Other | Admitting: Internal Medicine

## 2016-04-23 DIAGNOSIS — Z719 Counseling, unspecified: Secondary | ICD-10-CM

## 2016-04-23 DIAGNOSIS — R52 Pain, unspecified: Secondary | ICD-10-CM | POA: Diagnosis not present

## 2016-04-23 DIAGNOSIS — D471 Chronic myeloproliferative disease: Secondary | ICD-10-CM

## 2016-04-23 NOTE — Progress Notes (Signed)
Location:  [facility] Room Number: [id number] Place of Service:  SNF [phone])  Juan [name]. Juan Coil, MD  Patient Care Team: Colon Branch, MD as PCP - General Roxana Hires, MD as Consulting Physician (Hematology and Oncology) [name], MD as Consulting Physician (Cardiology) [name], MD as Consulting Physician (Hematology and Oncology)  Extended Emergency Contact Information Primary Emergency Contact: Marksberry,Carolyn Address: [place], [place] Mobile Phone: [phone] Relation: Daughter    Allergies: Patient has no known allergies.  Chief Complaint  Patient presents with  . Acute Visit    Acute    HPI: Patient is 75 y.o. male who is being seen today for discussion of participation in PT. Rehab are with me in pt's room to discuss his status with him.  Past Medical History:  Diagnosis Date  . Allergic rhinitis   . Anemia   . Ascending aortic aneurysm ([place]) 03/2014   4.3cm on CT scan  . CAD (coronary artery disease)    dx elsewheer in past, no documentation. Non-ischemic myoview 2007  . Chronic diastolic CHF (congestive heart failure), NYHA class 2 (HCC)    Normal EF w/ grade 1 dd by echo 12/2015  . Edema    R>L leg, u/s 5-12 neg for DVT  . Hemorrhoid   . History of thrombocytosis   . Hypertension   . Migraine    "once/wk at least" (07/11/2013)  . Myeloproliferative disease ([place])   . Shortness of breath   . Sinus congestion   . Sleep apnea, obstructive    at some point used CPAP, was d/c  years ago  . Type II diabetes mellitus ([place])     Past Surgical History:  Procedure Laterality Date  . TOE SURGERY Right    "tried to straighten out big toe" (07/11/2013)      Medication List       Accurate as of 04/23/16  2:19 PM. Always use your most recent med list.          acetaminophen 500 MG tablet Commonly known as:  TYLENOL Take 1 tablet (500 mg total) by mouth every 6 (six)  hours as needed for mild pain, moderate pain, fever or headache. Total acetaminophen dose from all sources not to exceed 4 g per day.   allopurinol 100 MG tablet Commonly known as:  ZYLOPRIM Take 1 tablet (100 mg total) by mouth daily.   aspirin 325 MG tablet Take 1 tablet (325 mg total) by mouth daily.   bisacodyl 10 MG suppository Commonly known as:  DULCOLAX Place 1 suppository (10 mg total) rectally daily as needed for moderate constipation.   carvedilol 3.125 MG tablet Commonly known as:  COREG Take 1 tablet (3.125 mg total) by mouth 2 (two) times daily.   feeding supplement (GLUCERNA SHAKE) Liqd Take 237 mLs by mouth daily. Reported on 08/21/2015   feeding supplement (PRO-STAT SUGAR FREE 64) Liqd Take 30 mLs by mouth 2 (two) times daily.   ferrous gluconate 225 (27 Fe) MG tablet Commonly known as:  FERGON Take 240 mg by mouth daily at 10 pm.   fluticasone 50 MCG/ACT nasal spray Commonly known as:  FLONASE Place 2 sprays into both nostrils daily. Reported on 05/02/2015   gabapentin 100 MG capsule Commonly known as:  NEURONTIN Take 100 mg by mouth 3 (three) times daily as needed (pain). Reported on 08/22/2015   guaiFENesin 600 MG  12 hr tablet Commonly known as:  MUCINEX Take 2 tablets (1,200 mg total) by mouth 2 (two) times daily.   hydrocerin Crea Apply Eucerin cream to BLE Q day after bathing and roughly towel drying to remove loose skin   HYDROcodone-acetaminophen 5-325 MG tablet Commonly known as:  NORCO/VICODIN Take 1 tablet by mouth every 8 (eight) hours as needed for severe pain.   predniSONE 10 MG tablet Commonly known as:  DELTASONE Take 4 tabs daily 2 days, then 3 tabs daily 2 days, then 2 tabs daily 2 days, then 1 tab daily 2 days, then stop.   simethicone 80 MG chewable tablet Commonly known as:  MYLICON Chew 80 mg by mouth every 6 (six) hours as needed for flatulence.   sodium polystyrene 15 GM/60ML suspension Commonly known as:   KAYEXALATE Take 15 g by mouth daily.   vitamin B-12 100 MCG tablet Commonly known as:  CYANOCOBALAMIN Take 100 mcg by mouth daily.       Meds ordered this encounter  Medications  . Amino Acids-Protein Hydrolys (FEEDING SUPPLEMENT, PRO-STAT SUGAR FREE 64,) LIQD    Sig: Take 30 mLs by mouth 2 (two) times daily.  . sodium polystyrene (KAYEXALATE) 15 GM/60ML suspension    Sig: Take 15 g by mouth daily.     Immunization History  Administered Date(s) Administered  . Influenza Split 02/25/2011  . Influenza Whole 03/17/2007, 04/25/2009  . Influenza, High Dose Seasonal PF 04/12/2013  . Influenza, Seasonal, Injecte, Preservative Fre 05/05/2012  . Influenza,inj,Quad PF,36+ Mos 01/26/2014, 01/22/2015, 04/04/2016  . PPD Test 04/19/2016  . Pneumococcal Conjugate-13 10/09/2014  . Pneumococcal Polysaccharide-23 01/14/2012, 03/31/2014  . Td 08/29/2009    Social History  Substance Use Topics  . Smoking status: Former Smoker    Packs/day: 0.25    Years: 12.00    Types: Cigarettes    Quit date: 05/18/1966  . Smokeless tobacco: Never Used     Comment: quit smoking 45 years ago  . Alcohol use No    Review of Systems  DATA OBTAINED: from patient,PT GENERAL:  no fevers, fatigue, appetite changes; pt c/o" hurts all over" SKIN: No itching, rash HEENT: No complaint RESPIRATORY: No cough, wheezing, SOB CARDIAC: No chest pain, palpitations, lower extremity edema  GI: No abdominal pain, No N/V/D or constipation, No heartburn or reflux  GU: No dysuria, frequency or urgency, or incontinence  MUSCULOSKELETAL: No unrelieved bone/joint pain NEUROLOGIC: No headache, dizziness  PSYCHIATRIC: No overt anxiety or sadness  Vitals:   04/23/16 1403  BP: 130/66  Pulse: 72  Resp: 20  Temp: 98.7 F (37.1 C)   Body mass index is 24 kg/m. Physical Exam  GENERAL APPEARANCE: Alert, conversant, No acute distress  SKIN: No diaphoresis rash HEENT: Unremarkable RESPIRATORY: Breathing is even,  unlabored. Lung sounds are clear   CARDIOVASCULAR: Heart RRR no murmurs, rubs or gallops. No peripheral edema  GASTROINTESTINAL: Abdomen is soft, non-tender, not distended w/ normal bowel sounds.  GENITOURINARY: Bladder non tender, not distended  MUSCULOSKELETAL: No abnormal joints or musculature NEUROLOGIC: Cranial nerves 2-12 grossly intact. Moves all extremities PSYCHIATRIC: Mood and affect appropriate to situation, no behavioral issues; pt has very poor insight  Patient Active Problem List   Diagnosis Date Noted  . Paroxysmal SVT (supraventricular tachycardia) ([place]) 04/20/2016  . Type II diabetes mellitus ([place]) 04/20/2016  . Gout 04/20/2016  . Hyperuricemia 04/20/2016  . B12 deficiency 04/20/2016  . 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 Constipation   . Acute on chronic  renal failure ([place])   . Hydronephrosis   . Systolic and diastolic CHF, acute on chronic ([place]) 04/08/2016  . Acute respiratory failure with hypoxia ([place]) 04/08/2016  . CHF exacerbation ([place]) 04/07/2016  . Acute renal failure superimposed on chronic kidney disease ([place]) 04/07/2016  . Thrombocytosis ([place]) 04/07/2016  . Anemia 04/07/2016  . Community acquired pneumonia 04/01/2016  . Pneumonia 04/01/2016  . Pressure injury of skin 04/01/2016  . Pressure ulcer stage II 12/31/2015  . UTI (urinary tract infection) 12/30/2015  . Sepsis ([place]) 12/29/2015  . UTI (lower urinary tract infection) 12/29/2015  . T wave inversion in EKG 12/29/2015  . Goals of care, counseling/discussion 05/02/2015  . Acute on chronic systolic congestive heart failure ([place]) 02/14/2015  . CKD (chronic kidney disease), stage III 01/22/2015  . Pain in limb   . Peripheral edema   . Unable to ambulate   . Thoracic aortic aneurysm ([place]) 04/26/2014  . CKD (chronic kidney disease) stage 3, GFR 30-59 ml/min 04/26/2014  . 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 Chest pain 04/15/2014  . Encounter for palliative  care 04/06/2014  . Weakness generalized 04/06/2014  . Venous stasis dermatitis of both lower extremities   . Malnutrition of moderate degree ([place]) 03/31/2014  . Morbid obesity due to excess calories ([place]) 03/29/2014  . 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 Leukocytosis 10/17/2013  . Poor compliance with advise  05/05/2012  . Annual physical exam 01/14/2012  . Weight loss 01/14/2012  . BPH (benign prostatic hyperplasia) 01/14/2012  . *Chronic lower extremity edema, stasis dermatitis 06/09/2011  . CARDIOMEGALY 04/29/2009  . *Prediabetes 09/07/2006  . Symptomatic anemia 09/07/2006  . Myeloproliferative disease ([place]) 09/07/2006  . Obstructive sleep apnea 09/07/2006  . Essential hypertension 09/07/2006  . Coronary atherosclerosis 09/07/2006  . *CHF-- PCP comments 09/07/2006  . Allergic rhinitis 09/07/2006    CMP     Component Value Date/Time   NA 139 04/21/2016   NA 143 07/30/2014 1202   NA 138 11/16/2013 0847   K 5.7 (A) 04/21/2016   K 5.1 (H) 07/30/2014 1202   K 4.5 11/16/2013 0847   CL 101 04/17/2016 0333   CL 105 07/30/2014 1202   CO2 27 04/17/2016 0333   CO2 28 07/30/2014 1202   CO2 22 11/16/2013 0847   GLUCOSE 138 (H) 04/17/2016 0333   GLUCOSE 109 07/30/2014 1202   BUN 75 (A) 04/21/2016   BUN 21 07/30/2014 1202   BUN 20.2 11/16/2013 0847   CREATININE 1.3 04/21/2016   CREATININE 1.53 (H) 04/17/2016 0333   CREATININE 1.3 (H) 07/30/2014 1202   CREATININE 1.2 11/16/2013 0847   CALCIUM 9.2 04/17/2016 0333   CALCIUM 9.6 07/30/2014 1202   CALCIUM 9.8 11/16/2013 0847   PROT 5.1 (L) 04/14/2016 0350   PROT 7.0 07/30/2014 1202   PROT 6.7 11/16/2013 0847   ALBUMIN 1.9 (L) 04/17/2016 0333   ALBUMIN 3.2 (L) 07/30/2014 1202   ALBUMIN 3.4 (L) 11/16/2013 0847   AST 25 04/14/2016 0350   AST 30 07/30/2014 1202   AST 25 11/16/2013 0847   ALT 12 (L) 04/14/2016 0350   ALT 10 07/30/2014 1202   ALT 11 11/16/2013 0847   ALKPHOS 82 04/14/2016 0350   ALKPHOS 88 (H)  07/30/2014 1202   ALKPHOS 125 11/16/2013 0847   BILITOT 0.7 04/14/2016 0350   BILITOT 0.50 07/30/2014 1202   BILITOT 0.35 11/16/2013 0847   GFRNONAA 43 (L) 04/17/2016 0333   GFRAA 50 (L) 04/17/2016 0333    Recent  Labs  04/15/16 0356 04/16/16 1024 04/17/16 0333 04/17/16 0334 04/21/16  NA 140  140 140 138 138 139  K 3.7  3.7 4.0 4.5  --  5.7*  CL 104 103 101  --   --   CO2 '25 26 27  '$ --   --   GLUCOSE 111* 117* 138*  --   --   BUN 102*  102* 94* 92* 5 75*  CREATININE 2.20*  2.2* 1.72* 1.53* 1.5* 1.3  CALCIUM 9.4 9.2 9.2  --   --   PHOS 4.3 3.5 3.6  --   --     Recent Labs  04/01/16 0826 04/07/16 0010 04/14/16 0350 04/15/16 0356 04/16/16 1024 04/17/16 0333  AST '30  30 31  31 25  '$ --   --   --   ALT 14*  14 12*  12 12*  --   --   --   ALKPHOS 78  78 76  76 82  --   --   --   BILITOT 0.4 0.3 0.7  --   --   --   PROT 5.9* 6.3* 5.1*  --   --   --   ALBUMIN 3.0* 3.0* 2.1* 2.1* 2.2* 1.9*    Recent Labs  04/10/16 0528 04/11/16 0336  04/14/16 0350 04/15/16 0356 04/17/16 0334 04/21/16  WBC 14.3* 15.0*  < > 18.4  18.4* 22.5  22.5* 30.9  30.9* 29.3  NEUTROABS 9.2* 9.5*  --   --  14.9*  --   --   HGB 8.7* 8.9*  < > 7.4*  7.4* 7.9*  7.9* 8.0* 8.7*  HCT 26.5* 27.2*  < > 22.9*  23* 24.7*  25* 25.8* 27*  MCV 77.7* 76.8*  < > 76.6* 78.4 78.9  --   PLT 575* 498*  < > 491*  491* 565*  565* 714* 653*  < > = values in this interval not displayed.  Recent Labs  12/26/15 1036  CHOL 129  LDLCALC 73  TRIG 86.0   Lab Results  Component Value Date   MICROALBUR 0.5 05/31/2008   Lab Results  Component Value Date   TSH 2.49 08/22/2015   Lab Results  Component Value Date   HGBA1C 6.1 12/26/2015   Lab Results  Component Value Date   CHOL 129 12/26/2015   HDL 38.10 (L) 12/26/2015   LDLCALC 73 12/26/2015   TRIG 86.0 12/26/2015   CHOLHDL 3 12/26/2015    Significant Diagnostic Results in last 30 days:  Ct Abdomen Pelvis Wo Contrast  Result Date:  04/15/2016 CLINICAL DATA:  Evaluate hydronephrosis. EXAM: CT ABDOMEN AND PELVIS WITHOUT CONTRAST TECHNIQUE: Multidetector CT imaging of the abdomen and pelvis was performed following the standard protocol without IV contrast. COMPARISON:  None. FINDINGS: Lower chest: There is subsegmental atelectasis within the lung bases. There is also a mosaic attenuation pattern within the visualized portions of the lungs. No pleural effusion noted. Hepatobiliary: No focal liver abnormality is seen. No gallstones, gallbladder wall thickening, or biliary dilatation.There is subsegmental atelectasis noted in the lung bases. Pancreas: Unremarkable. No pancreatic ductal dilatation or surrounding inflammatory changes. Spleen: The spleen is enlarged measuring 15.4 cm in length. Adrenals/Urinary Tract: Normal appearance of the adrenal glands. Cyst within the inferior pole the right kidney is incompletely characterized without IV contrast measuring 3 cm. No renal calculi identified. Very mild bilateral pelvocaliectasis identified. No evidence for high-grade obstructive uropathy. The urinary bladder is collapsed around a Foley catheter. Stomach/Bowel: The stomach is normal.  The small bowel loops have a normal course and caliber. Numerous colonic diverticula identified. No evidence for acute diverticulitis. Moderate stool burden is noted within the rectum. Vascular/Lymphatic: Aortic atherosclerosis. No adenopathy identified within the abdomen or the pelvis. Reproductive: Prostate is unremarkable. Other: No free fluid or fluid collections. Musculoskeletal: The bones are diffusely sclerotic compatible with widespread osseous metastatic disease. IMPRESSION: 1. Interval decompression of bilateral renal collecting systems with mild residual pelvocaliectasis status post catheter placement. At this time there is no evidence for high-grade obstructive uropathy of either kidney. 2. Splenomegaly. 3. Diffuse sclerotic bone metastases. 4. Aortic  atherosclerosis. Electronically Signed   By: Kerby Moors M.D.   On: 04/15/2016 10:25   Dg Chest 2 View  Result Date: 04/06/2016 CLINICAL DATA:  Shortness of breath. Sore back radiating to both flanks. Discharge from [facility] on Saturday with diagnosis of pneumonia. Continued fever and chills. EXAM: CHEST  2 VIEW COMPARISON:  04/01/2016 FINDINGS: Shallow inspiration with linear atelectasis in the lung bases. Cardiac enlargement. Diffuse airspace opacities in the lungs consistent with edema or multifocal pneumonia. Similar appearance to previous study. No blunting of costophrenic angles. No pneumothorax. Calcified aorta. Thoracic scoliosis convex towards the right. IMPRESSION: Cardiac enlargement. Diffuse airspace disease suggesting edema or pneumonia. Electronically Signed   By: Lucienne Capers M.D.   On: 04/06/2016 23:55   Dg Chest 2 View  Result Date: 04/01/2016 CLINICAL DATA:  Acute onset of fever, cough and body aches. Initial encounter. EXAM: CHEST  2 VIEW COMPARISON:  Chest radiograph performed 12/29/2015 FINDINGS: The lungs are well-aerated. Vascular congestion is noted. Bilateral central airspace opacities may reflect pneumonia or pulmonary edema. No pleural effusion or pneumothorax is seen. The heart is mildly enlarged. No acute osseous abnormalities are seen. IMPRESSION: Vascular congestion and mild cardiomegaly. Bilateral central airspace opacities may reflect pneumonia or pulmonary edema. Electronically Signed   By: Garald Balding M.D.   On: 04/01/2016 03:08   Dg Wrist Complete Right  Result Date: 04/15/2016 CLINICAL DATA:  Acute right wrist pain at the radial aspect of the wrist as well as the proximal second and third metacarpals for 1 day. Right arm weakness. Initial encounter. EXAM: RIGHT WRIST - COMPLETE 3+ VIEW COMPARISON:  None. FINDINGS: There is no evidence of acute fracture or dislocation. No focal osseous lesion or osseous erosion is seen. Joint space widths are  preserved. No focal soft tissue abnormality is identified. IMPRESSION: No evidence of acute osseous abnormality. Electronically Signed   By: Logan Bores M.D.   On: 04/15/2016 19:25   Ct Chest Wo Contrast  Result Date: 04/01/2016 CLINICAL DATA:  Chest pain. Evaluate for aortic aneurysm. History of myeloproliferative disorder. EXAM: CT CHEST WITHOUT CONTRAST TECHNIQUE: Multidetector CT imaging of the chest was performed following the standard protocol without IV contrast. COMPARISON:  06/21/2014 FINDINGS: Cardiovascular: Ascending thoracic aorta measures up to 4.4 cm and stable. Atherosclerotic calcifications involving the posterior aortic arch. Proximal descending thoracic aorta measures 3.8 cm and stable. Mid descending thoracic aorta measures 3.4 cm and stable. Main pulmonary artery is markedly enlarged measuring 5 cm but stable. Mediastinum/Nodes: Small mediastinal lymph nodes have not significantly changed. There is a 1 cm hypodense nodule or cyst in the right thyroid lobe. There is subcutaneous edema in the chest. No significant pericardial fluid. Lungs/Pleura: Trachea and mainstem bronchi are patent. Again noted is a mosaic attenuation pattern throughout the lungs. This pattern probably represents multiple areas of air trapping. These are prominent pulmonary vessels in the lung bases.  Volume loss in the right lower lobe related to elevation of the right hemidiaphragm. There is some septal thickening in the upper lobes. No significant pleural effusions. Upper Abdomen: Again noted is an enlarged spleen which is only partially visualized. No acute abnormality in the upper abdomen. There is a high-density structure within the stomach lumen. Musculoskeletal: The visualized bones are diffusely sclerotic which is similar to the previous examination. There are small scattered lucent areas throughout the bones. Some of these lucent areas/lesions have enlarged from the prior examination. Lucent lesion along the  posterior left sixth rib now measures 1.1 cm and previously measured 0.7 cm. No evidence for pathologic fracture. 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 Recommend annual imaging followup by CTA or MRA. This recommendation follows 2010 ACCF/AHA/AATS/ACR/ASA/SCA/SCAI/SIR/STS/SVM Guidelines for the Diagnosis and Management of Patients with Thoracic Aortic Disease. Circulation. 2010; 121: Q683-M196 Stable enlargement of pulmonary arteries compatible with pulmonary hypertension. Mosaic attenuation pattern throughout both lungs. Similar finding was present on the previous examination. This is probably related to a combination of air trapping and mild edema. Bones are diffusely sclerotic with enlarged lucent lesions. 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 Splenomegaly. This is probably related to the history of myeloproliferative disorder. Electronically Signed   By: Markus Daft M.D.   On: 04/01/2016 11:14   Nm Bone Scan Whole Body  Result Date: 04/17/2016 CLINICAL DATA:  Diffuse bone pain.  History of myelofibrosis. EXAM: NUCLEAR MEDICINE WHOLE BODY BONE SCAN TECHNIQUE: Whole body anterior and posterior images were obtained approximately 3 hours after intravenous injection of radiopharmaceutical. RADIOPHARMACEUTICALS:  25.2 mCi Technetium-74mMDP IV COMPARISON:  Chest CT 04/01/2016.  CT abdomen and pelvis 04/14/2016. FINDINGS: There is diffusely increased radiotracer uptake throughout nearly the entirety of the axial and visualized appendicular skeleton with relative sparing of the skull. Symmetrically increased uptake is present about the hips, knees, ankles, and shoulders. The kidneys are only faintly visualized without evidence of significant radiotracer excretion into the collecting system or bladder, and this may reflect a combination of renal failure and the diffuse osseous tracer uptake. IMPRESSION: Diffusely abnormal uptake throughout the  skeleton which is compatible with the patient's history of myelofibrosis. Diffuse osseous metastatic disease from a non-hematologic primary malignancy and metabolic bone diseases can also give this appearance. Electronically Signed   By: ALogan BoresM.D.   On: 04/17/2016 15:12   [place]Renal  Result Date: 04/13/2016 CLINICAL DATA:  Acute on chronic renal failure EXAM: RENAL / URINARY TRACT ULTRASOUND COMPLETE COMPARISON:  04/03/2014 FINDINGS: Right Kidney: Length: 11.1 cm. Increased echotexture. 2.8 cm lower pole cyst. Mild right hydronephrosis. Left Kidney: Length: 12.7 cm. Increased echotexture. There is severe left hydronephrosis. 1.4 cm echogenic, shadowing area in the lower pole, likely nonobstructing left renal stone. Bladder: Multiple large cystic areas within the pelvis which appear to be contiguous with the bladder compatible with diverticula and trabeculations, stable. IMPRESSION: Mild right hydronephrosis and severe left hydronephrosis. 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 Probable 14 mm nonobstructing left lower pole renal stone. Increased echotexture in the kidneys compatible with chronic medical renal disease. Electronically Signed   By: KRolm BaptiseM.D.   On: 04/13/2016 13:01   Dg Chest Port 1 View  Result Date: 04/10/2016 CLINICAL DATA:  Acute respiratory failure with hypoxia. Pulmonary edema versus other infectious or leukemic infiltrates. EXAM: PORTABLE CHEST 1 VIEW COMPARISON:  Chest x-rays dated 04/06/2016, 04/01/2016, 12/29/2015 and 01/21/2015. FINDINGS: No significant  change compared to the most recent chest x-ray of 04/06/2016, with persistent bilateral perihilar and bibasilar opacities compatible with either pulmonary edema or multifocal pneumonia. No new lung findings. No pleural effusions seen. Heart size and mediastinal contours are stable. Atherosclerotic changes again noted at the aortic arch. Osseous structures appear mottled,  particularly about each shoulder, compatible with the mixed sclerotic and lucent lesions described on earlier chest CT of 04/01/2016. IMPRESSION: 1. Persistent perihilar and bibasilar airspace opacities, unchanged compared to the most recent chest x-ray of 04/06/2016, compatible with either pulmonary edema or multifocal pneumonia, favor pulmonary edema. No new lung findings. 2. Mottled appearance of the osseous structures, particularly prominent about each shoulder, compatible with the mixed sclerotic and lucent lesions described on chest CT of 04/01/2016, highly suspicious for metastatic disease. 3. Aortic atherosclerosis. Electronically Signed   By: Franki Cabot M.D.   On: 04/10/2016 19:06    Assessment and Plan  ENCOUNTER FOR EDUCATION/ pain control of sclerotic lesions-  Prior to our meting I reviewed pt's entire EPIC chart; he is known to have sclerotic lesions from his myeloproliferative disease that could be causing pain, although pt never looks like he is in pain; having said that PT says he is just not trying, he says he is in pain, does not lok in pain , then just quits. The bulk of my conversation with him was that pain or not here and now if pt ever wants to be able to walk again he is going to have to work on it NOW. Pain and all. I will increase pt's pain medications .     Time spent > 35 min Hazyl Marseille D. Juan Coil, MD

## 2016-04-24 LAB — BASIC METABOLIC PANEL
BUN: 64 mg/dL — AB (ref 4–21)
CREATININE: 1.2 mg/dL (ref 0.6–1.3)
GLUCOSE: 92 mg/dL
POTASSIUM: 5.3 mmol/L (ref 3.4–5.3)
SODIUM: 141 mmol/L (ref 137–147)

## 2016-04-24 LAB — CBC AND DIFFERENTIAL
HEMATOCRIT: 31 % — AB (ref 41–53)
Hemoglobin: 9.5 g/dL — AB (ref 13.5–17.5)
Platelets: 558 10*3/uL — AB (ref 150–399)
WBC: 20.3 10^3/mL

## 2016-04-29 ENCOUNTER — Telehealth: Payer: Self-pay | Admitting: *Deleted

## 2016-04-29 NOTE — Telephone Encounter (Signed)
No voicemail option

## 2016-04-30 ENCOUNTER — Other Ambulatory Visit: Payer: Self-pay | Admitting: *Deleted

## 2016-04-30 ENCOUNTER — Ambulatory Visit: Payer: Self-pay | Admitting: Internal Medicine

## 2016-04-30 NOTE — Patient Outreach (Signed)
[facility] ([facility]) Care Management  04/30/2016  Treyvin Elders 05/19/1940 [id number]   Met with patient and his son, Jason at bedside. Patient states he is not feeling well today, he reports a lot of pain in spite of pain medications being administered by facility. Briefly discussed RNCM role and gave [facility] care management brochure. Patient states he is not sure of when he will discharge. He asks RNCM to visit again next week. Appointment set to visit with patient and family 12/18 11am.  Met with LCSW, Kirstin Dowd, she states that patient has been "cut" by therapy, set discharge for 12/19, due to not participating, they do not want patient to discharge as they feel he is not ready, but they cannot keep him on service if he is not participating. She also states that patient states he is refusing to accept home care services. She states he has been very rude with her and staff, he refused to sign [facility] letter. She has asked administrator to attempt to get patient to sign [facility] letter.   SW, RNCM and administrator spoke with son regarding patient situation, son states he is disabled and cannot provide as much care as anticipated but states they cannot afford for patient to remain at facility, patient does not have Medicaid at this time.   Plan to meet with patient as scheduled to attempt to discuss [facility] Care Management program services and assist with discharge plan.   E. , RN., BSN, CCM [facility] Post-Acute care Coordinator [phone]  

## 2016-05-01 ENCOUNTER — Encounter: Payer: Self-pay | Admitting: Internal Medicine

## 2016-05-01 ENCOUNTER — Non-Acute Institutional Stay (SKILLED_NURSING_FACILITY): Payer: Medicare Other | Admitting: Internal Medicine

## 2016-05-01 DIAGNOSIS — R635 Abnormal weight gain: Secondary | ICD-10-CM

## 2016-05-01 DIAGNOSIS — D471 Chronic myeloproliferative disease: Secondary | ICD-10-CM

## 2016-05-01 DIAGNOSIS — E875 Hyperkalemia: Secondary | ICD-10-CM | POA: Diagnosis not present

## 2016-05-01 DIAGNOSIS — M25551 Pain in right hip: Secondary | ICD-10-CM | POA: Diagnosis not present

## 2016-05-01 DIAGNOSIS — M25552 Pain in left hip: Secondary | ICD-10-CM | POA: Diagnosis not present

## 2016-05-01 DIAGNOSIS — I5042 Chronic combined systolic (congestive) and diastolic (congestive) heart failure: Secondary | ICD-10-CM

## 2016-05-01 NOTE — Progress Notes (Signed)
Location:  [place] Room Number: [phone] Place of Service:  SNF [phone])  Juan [name]. Juan Coil, MD  Patient Care Team: Colon Branch, MD as PCP - General Roxana Hires, MD as Consulting Physician (Hematology and Oncology) [name], MD as Consulting Physician (Cardiology) [name], MD as Consulting Physician (Hematology and Oncology)  Extended Emergency Contact Information Primary Emergency Contact: Getter,Carolyn Address: [place], [place] Mobile Phone: [phone] Relation: Daughter    Allergies: Patient has no known allergies.  Chief Complaint  Patient presents with  . Acute Visit    Acute    HPI: Patient is 75 y.o. male who is being seen acutely because dietary has made me aware that pt has gain 10 pounds. Pt was admitted to SNF after admission for acute CHF, but advanced heart failure team advised diuretics on as needed basis only. Pt denies SOB or swelling. Pt does c/o pain and wants more pain medications.  Past Medical History:  Diagnosis Date  . Allergic rhinitis   . Anemia   . Ascending aortic aneurysm ([place]) 03/2014   4.3cm on CT scan  . CAD (coronary artery disease)    dx elsewheer in past, no documentation. Non-ischemic myoview 2007  . Chronic diastolic CHF (congestive heart failure), NYHA class 2 (HCC)    Normal EF w/ grade 1 dd by echo 12/2015  . Edema    R>L leg, u/s 5-12 neg for DVT  . Hemorrhoid   . History of thrombocytosis   . Hypertension   . Migraine    "once/wk at least" (07/11/2013)  . Myeloproliferative disease ([place])   . Shortness of breath   . Sinus congestion   . Sleep apnea, obstructive    at some point used CPAP, was d/c  years ago  . Type II diabetes mellitus ([place])     Past Surgical History:  Procedure Laterality Date  . TOE SURGERY Right    "tried to straighten out big toe" (07/11/2013)    Allergies as of 05/01/2016   No Known Allergies       Medication List       Accurate as of 05/01/16  1:54 PM. Always use your most recent med list.          acetaminophen 325 MG tablet Commonly known as:  TYLENOL Take 325 mg by mouth every 6 (six) hours as needed for fever. For fever > or = 101   acetaminophen 500 MG tablet Commonly known as:  TYLENOL Take 1 tablet (500 mg total) by mouth every 6 (six) hours as needed for mild pain, moderate pain, fever or headache. Total acetaminophen dose from all sources not to exceed 4 g per day.   allopurinol 100 MG tablet Commonly known as:  ZYLOPRIM Take 1 tablet (100 mg total) by mouth daily.   aspirin 325 MG tablet Take 325 mg by mouth daily.   bisacodyl 10 MG suppository Commonly known as:  DULCOLAX Place 1 suppository (10 mg total) rectally daily as needed for moderate constipation.   carvedilol 3.125 MG tablet Commonly known as:  COREG Take 1 tablet (3.125 mg total) by mouth 2 (two) times daily.   diclofenac sodium 1 % Gel Commonly known as:  VOLTAREN Apply 4 g topically 2 (two) times daily. Apply to bilateral  knees   feeding supplement (GLUCERNA SHAKE) Liqd Take 237 mLs by mouth daily. Reported on 08/21/2015  feeding supplement (PRO-STAT SUGAR FREE 64) Liqd Take 30 mLs by mouth 2 (two) times daily.   FERROUS GLUCONATE PO Take 240 mg by mouth daily at 10 pm. 1 tablet   fluticasone 50 MCG/ACT nasal spray Commonly known as:  FLONASE Place 2 sprays into both nostrils daily. Reported on 05/02/2015   gabapentin 100 MG capsule Commonly known as:  NEURONTIN Take 100 mg by mouth 3 (three) times daily as needed (pain). Reported on 08/22/2015   guaiFENesin 600 MG 12 hr tablet Commonly known as:  MUCINEX Take 2 tablets (1,200 mg total) by mouth 2 (two) times daily.   hydrocerin Crea Apply Eucerin cream to BLE Q day after bathing and roughly towel drying to remove loose skin   HYDROcodone-acetaminophen 5-325 MG tablet Commonly known as:  NORCO/VICODIN Take 1 tablet by mouth  every 8 (eight) hours. routinely   simethicone 80 MG chewable tablet Commonly known as:  MYLICON Chew 80 mg by mouth every 6 (six) hours as needed for flatulence.   sodium polystyrene 15 GM/60ML suspension Commonly known as:  KAYEXALATE Take 15 g by mouth daily.   vitamin B-12 50 MCG tablet Commonly known as:  CYANOCOBALAMIN Take 100 mcg by mouth daily. 2 tablets       Meds ordered this encounter  Medications  . diclofenac sodium (VOLTAREN) 1 % GEL    Sig: Apply 4 g topically 2 (two) times daily. Apply to bilateral  knees  . acetaminophen (TYLENOL) 325 MG tablet    Sig: Take 325 mg by mouth every 6 (six) hours as needed for fever. For fever > or = 101  . aspirin 325 MG tablet    Sig: Take 325 mg by mouth daily.  . vitamin B-12 (CYANOCOBALAMIN) 50 MCG tablet    Sig: Take 100 mcg by mouth daily. 2 tablets  . FERROUS GLUCONATE PO    Sig: Take 240 mg by mouth daily at 10 pm. 1 tablet  . HYDROcodone-acetaminophen (NORCO/VICODIN) 5-325 MG tablet    Sig: Take 1 tablet by mouth every 8 (eight) hours. routinely    Immunization History  Administered Date(s) Administered  . Influenza Split 02/25/2011  . Influenza Whole 03/17/2007, 04/25/2009  . Influenza, High Dose Seasonal PF 04/12/2013  . Influenza, Seasonal, Injecte, Preservative Fre 05/05/2012  . Influenza,inj,Quad PF,36+ Mos 01/26/2014, 01/22/2015, 04/04/2016  . PPD Test 04/19/2016  . Pneumococcal Conjugate-13 10/09/2014  . Pneumococcal Polysaccharide-23 01/14/2012, 03/31/2014  . Td 08/29/2009    Social History  Substance Use Topics  . Smoking status: Former Smoker    Packs/day: 0.25    Years: 12.00    Types: Cigarettes    Quit date: 05/18/1966  . Smokeless tobacco: Never Used     Comment: quit smoking 45 years ago  . Alcohol use No    Review of Systems  DATA OBTAINED: from patient- as per HPI GENERAL:  no fevers, fatigue, appetite changes SKIN: No itching, rash HEENT: No complaint RESPIRATORY: No cough,  wheezing, SOB CARDIAC: No chest pain, palpitations, lower extremity edema  GI: No abdominal pain, No N/V/D or constipation, No heartburn or reflux  GU: No dysuria, frequency or urgency, or incontinence  MUSCULOSKELETAL: No unrelieved bone/joint pain NEUROLOGIC: No headache, dizziness  PSYCHIATRIC: No overt anxiety or sadness  Vitals:   05/01/16 1348  BP: (!) 142/79  Pulse: 97  Resp: 20  Temp: 99.8 F (37.7 C)   Body mass index is 24 kg/m. Physical Exam  GENERAL APPEARANCE: Alert, conversant, No acute distress  SKIN: No  diaphoresis rash HEENT: Unremarkable RESPIRATORY: Breathing is even, unlabored. Lung sounds are clear   CARDIOVASCULAR: Heart RRR no murmurs, rubs or gallops. trace peripheral edema  GASTROINTESTINAL: Abdomen is soft, non-tender, not distended w/ normal bowel sounds.  GENITOURINARY: Bladder non tender, not distended  MUSCULOSKELETAL: No abnormal joints or musculature NEUROLOGIC: Cranial nerves 2-12 grossly intact. Moves all extremities PSYCHIATRIC: Mood and affect appropriate to situation, no behavioral issues  Patient Active Problem List   Diagnosis Date Noted  . Paroxysmal SVT (supraventricular tachycardia) ([place]) 04/20/2016  . Type II diabetes mellitus ([place]) 04/20/2016  . Gout 04/20/2016  . Hyperuricemia 04/20/2016  . B12 deficiency 04/20/2016  . 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 Constipation   . Acute on chronic renal failure ([place])   . Hydronephrosis   . Systolic and diastolic CHF, acute on chronic ([place]) 04/08/2016  . Acute respiratory failure with hypoxia ([place]) 04/08/2016  . CHF exacerbation ([place]) 04/07/2016  . Acute renal failure superimposed on chronic kidney disease ([place]) 04/07/2016  . Thrombocytosis ([place]) 04/07/2016  . Anemia 04/07/2016  . Community acquired pneumonia 04/01/2016  . Pneumonia 04/01/2016  . Pressure injury of skin 04/01/2016  . Pressure ulcer stage II 12/31/2015  . UTI (urinary tract infection) 12/30/2015   . Sepsis ([place]) 12/29/2015  . UTI (lower urinary tract infection) 12/29/2015  . T wave inversion in EKG 12/29/2015  . Goals of care, counseling/discussion 05/02/2015  . Acute on chronic systolic congestive heart failure ([place]) 02/14/2015  . CKD (chronic kidney disease), stage III 01/22/2015  . Pain in limb   . Peripheral edema   . Unable to ambulate   . Thoracic aortic aneurysm ([place]) 04/26/2014  . CKD (chronic kidney disease) stage 3, GFR 30-59 ml/min 04/26/2014  . 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 Chest pain 04/15/2014  . Encounter for palliative care 04/06/2014  . Weakness generalized 04/06/2014  . Venous stasis dermatitis of both lower extremities   . Malnutrition of moderate degree ([place]) 03/31/2014  . Morbid obesity due to excess calories ([place]) 03/29/2014  . 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 Leukocytosis 10/17/2013  . Poor compliance with advise  05/05/2012  . Annual physical exam 01/14/2012  . Weight loss 01/14/2012  . BPH (benign prostatic hyperplasia) 01/14/2012  . *Chronic lower extremity edema, stasis dermatitis 06/09/2011  . CARDIOMEGALY 04/29/2009  . *Prediabetes 09/07/2006  . Symptomatic anemia 09/07/2006  . Myeloproliferative disease ([place]) 09/07/2006  . Obstructive sleep apnea 09/07/2006  . Essential hypertension 09/07/2006  . Coronary atherosclerosis 09/07/2006  . *CHF-- PCP comments 09/07/2006  . Allergic rhinitis 09/07/2006    CMP     Component Value Date/Time   NA 141 04/24/2016   NA 143 07/30/2014 1202   NA 138 11/16/2013 0847   K 5.3 04/24/2016   K 5.1 (H) 07/30/2014 1202   K 4.5 11/16/2013 0847   CL 101 04/17/2016 0333   CL 105 07/30/2014 1202   CO2 27 04/17/2016 0333   CO2 28 07/30/2014 1202   CO2 22 11/16/2013 0847   GLUCOSE 138 (H) 04/17/2016 0333   GLUCOSE 109 07/30/2014 1202   BUN 64 (A) 04/24/2016   BUN 21 07/30/2014 1202   BUN 20.2 11/16/2013 0847   CREATININE 1.2 04/24/2016   CREATININE 1.53 (H)  04/17/2016 0333   CREATININE 1.3 (H) 07/30/2014 1202   CREATININE 1.2 11/16/2013 0847   CALCIUM 9.2 04/17/2016 0333   CALCIUM 9.6 07/30/2014 1202   CALCIUM 9.8 11/16/2013 0847   PROT 5.1 (L)  04/14/2016 0350   PROT 7.0 07/30/2014 1202   PROT 6.7 11/16/2013 0847   ALBUMIN 1.9 (L) 04/17/2016 0333   ALBUMIN 3.2 (L) 07/30/2014 1202   ALBUMIN 3.4 (L) 11/16/2013 0847   AST 25 04/14/2016 0350   AST 30 07/30/2014 1202   AST 25 11/16/2013 0847   ALT 12 (L) 04/14/2016 0350   ALT 10 07/30/2014 1202   ALT 11 11/16/2013 0847   ALKPHOS 82 04/14/2016 0350   ALKPHOS 88 (H) 07/30/2014 1202   ALKPHOS 125 11/16/2013 0847   BILITOT 0.7 04/14/2016 0350   BILITOT 0.50 07/30/2014 1202   BILITOT 0.35 11/16/2013 0847   GFRNONAA 43 (L) 04/17/2016 0333   GFRAA 50 (L) 04/17/2016 0333    Recent Labs  04/15/16 0356 04/16/16 1024 04/17/16 0333 04/17/16 0334 04/21/16 04/24/16  NA 140  140 140 138 138 139 141  K 3.7  3.7 4.0 4.5  --  5.7* 5.3  CL 104 103 101  --   --   --   CO2 '25 26 27  '$ --   --   --   GLUCOSE 111* 117* 138*  --   --   --   BUN 102*  102* 94* 92* 5 75* 64*  CREATININE 2.20*  2.2* 1.72* 1.53* 1.5* 1.3 1.2  CALCIUM 9.4 9.2 9.2  --   --   --   PHOS 4.3 3.5 3.6  --   --   --     Recent Labs  04/01/16 0826 04/07/16 0010 04/14/16 0350 04/15/16 0356 04/16/16 1024 04/17/16 0333  AST '30  30 31  31 25  '$ --   --   --   ALT 14*  14 12*  12 12*  --   --   --   ALKPHOS 78  78 76  76 82  --   --   --   BILITOT 0.4 0.3 0.7  --   --   --   PROT 5.9* 6.3* 5.1*  --   --   --   ALBUMIN 3.0* 3.0* 2.1* 2.1* 2.2* 1.9*    Recent Labs  04/10/16 0528 04/11/16 0336  04/14/16 0350 04/15/16 0356 04/17/16 0334 04/21/16 04/24/16  WBC 14.3* 15.0*  < > 18.4  18.4* 22.5  22.5* 30.9  30.9* 29.3 20.3  NEUTROABS 9.2* 9.5*  --   --  14.9*  --   --   --   HGB 8.7* 8.9*  < > 7.4*  7.4* 7.9*  7.9* 8.0* 8.7* 9.5*  HCT 26.5* 27.2*  < > 22.9*  23* 24.7*  25* 25.8* 27* 31*  MCV 77.7* 76.8*   < > 76.6* 78.4 78.9  --   --   PLT 575* 498*  < > 491*  491* 565*  565* 714* 653* 558*  < > = values in this interval not displayed.  Recent Labs  12/26/15 1036  CHOL 129  LDLCALC 73  TRIG 86.0   Lab Results  Component Value Date   MICROALBUR 0.5 05/31/2008   Lab Results  Component Value Date   TSH 2.49 08/22/2015   Lab Results  Component Value Date   HGBA1C 6.1 12/26/2015   Lab Results  Component Value Date   CHOL 129 12/26/2015   HDL 38.10 (L) 12/26/2015   LDLCALC 73 12/26/2015   TRIG 86.0 12/26/2015   CHOLHDL 3 12/26/2015    Significant Diagnostic Results in last 30 days:  Ct Abdomen Pelvis Wo Contrast  Result Date: 04/15/2016 CLINICAL  DATA:  Evaluate hydronephrosis. EXAM: CT ABDOMEN AND PELVIS WITHOUT CONTRAST TECHNIQUE: Multidetector CT imaging of the abdomen and pelvis was performed following the standard protocol without IV contrast. COMPARISON:  None. FINDINGS: Lower chest: There is subsegmental atelectasis within the lung bases. There is also a mosaic attenuation pattern within the visualized portions of the lungs. No pleural effusion noted. Hepatobiliary: No focal liver abnormality is seen. No gallstones, gallbladder wall thickening, or biliary dilatation.There is subsegmental atelectasis noted in the lung bases. Pancreas: Unremarkable. No pancreatic ductal dilatation or surrounding inflammatory changes. Spleen: The spleen is enlarged measuring 15.4 cm in length. Adrenals/Urinary Tract: Normal appearance of the adrenal glands. Cyst within the inferior pole the right kidney is incompletely characterized without IV contrast measuring 3 cm. No renal calculi identified. Very mild bilateral pelvocaliectasis identified. No evidence for high-grade obstructive uropathy. The urinary bladder is collapsed around a Foley catheter. Stomach/Bowel: The stomach is normal. The small bowel loops have a normal course and caliber. Numerous colonic diverticula identified. No evidence for  acute diverticulitis. Moderate stool burden is noted within the rectum. Vascular/Lymphatic: Aortic atherosclerosis. No adenopathy identified within the abdomen or the pelvis. Reproductive: Prostate is unremarkable. Other: No free fluid or fluid collections. Musculoskeletal: The bones are diffusely sclerotic compatible with widespread osseous metastatic disease. IMPRESSION: 1. Interval decompression of bilateral renal collecting systems with mild residual pelvocaliectasis status post catheter placement. At this time there is no evidence for high-grade obstructive uropathy of either kidney. 2. Splenomegaly. 3. Diffuse sclerotic bone metastases. 4. Aortic atherosclerosis. Electronically Signed   By: Kerby Moors M.D.   On: 04/15/2016 10:25   Dg Chest 2 View  Result Date: 04/06/2016 CLINICAL DATA:  Shortness of breath. Sore back radiating to both flanks. Discharge from [facility] on Saturday with diagnosis of pneumonia. Continued fever and chills. EXAM: CHEST  2 VIEW COMPARISON:  04/01/2016 FINDINGS: Shallow inspiration with linear atelectasis in the lung bases. Cardiac enlargement. Diffuse airspace opacities in the lungs consistent with edema or multifocal pneumonia. Similar appearance to previous study. No blunting of costophrenic angles. No pneumothorax. Calcified aorta. Thoracic scoliosis convex towards the right. IMPRESSION: Cardiac enlargement. Diffuse airspace disease suggesting edema or pneumonia. Electronically Signed   By: Lucienne Capers M.D.   On: 04/06/2016 23:55   Dg Wrist Complete Right  Result Date: 04/15/2016 CLINICAL DATA:  Acute right wrist pain at the radial aspect of the wrist as well as the proximal second and third metacarpals for 1 day. Right arm weakness. Initial encounter. EXAM: RIGHT WRIST - COMPLETE 3+ VIEW COMPARISON:  None. FINDINGS: There is no evidence of acute fracture or dislocation. No focal osseous lesion or osseous erosion is seen. Joint space widths are  preserved. No focal soft tissue abnormality is identified. IMPRESSION: No evidence of acute osseous abnormality. Electronically Signed   By: Logan Bores M.D.   On: 04/15/2016 19:25   Nm Bone Scan Whole Body  Result Date: 04/17/2016 CLINICAL DATA:  Diffuse bone pain.  History of myelofibrosis. EXAM: NUCLEAR MEDICINE WHOLE BODY BONE SCAN TECHNIQUE: Whole body anterior and posterior images were obtained approximately 3 hours after intravenous injection of radiopharmaceutical. RADIOPHARMACEUTICALS:  25.2 mCi Technetium-70mMDP IV COMPARISON:  Chest CT 04/01/2016.  CT abdomen and pelvis 04/14/2016. FINDINGS: There is diffusely increased radiotracer uptake throughout nearly the entirety of the axial and visualized appendicular skeleton with relative sparing of the skull. Symmetrically increased uptake is present about the hips, knees, ankles, and shoulders. The kidneys are only faintly visualized without evidence  of significant radiotracer excretion into the collecting system or bladder, and this may reflect a combination of renal failure and the diffuse osseous tracer uptake. IMPRESSION: Diffusely abnormal uptake throughout the skeleton which is compatible with the patient's history of myelofibrosis. Diffuse osseous metastatic disease from a non-hematologic primary malignancy and metabolic bone diseases can also give this appearance. Electronically Signed   By: Logan Bores M.D.   On: 04/17/2016 15:12   US Renal  Result Date: 04/13/2016 CLINICAL DATA:  Acute on chronic renal failure EXAM: RENAL / URINARY TRACT ULTRASOUND COMPLETE COMPARISON:  04/03/2014 FINDINGS: Right Kidney: Length: 11.1 cm. Increased echotexture. 2.8 cm lower pole cyst. Mild right hydronephrosis. Left Kidney: Length: 12.7 cm. Increased echotexture. There is severe left hydronephrosis. 1.4 cm echogenic, shadowing area in the lower pole, likely nonobstructing left renal stone. Bladder: Multiple large cystic areas within the pelvis which appear  to be contiguous with the bladder compatible with diverticula and trabeculations, stable. IMPRESSION: Mild right hydronephrosis and severe left hydronephrosis. 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 Probable 14 mm nonobstructing left lower pole renal stone. Increased echotexture in the kidneys compatible with chronic medical renal disease. Electronically Signed   By: Rolm Baptise M.D.   On: 04/13/2016 13:01   Dg Chest Port 1 View  Result Date: 04/10/2016 CLINICAL DATA:  Acute respiratory failure with hypoxia. Pulmonary edema versus other infectious or leukemic infiltrates. EXAM: PORTABLE CHEST 1 VIEW COMPARISON:  Chest x-rays dated 04/06/2016, 04/01/2016, 12/29/2015 and 01/21/2015. FINDINGS: No significant change compared to the most recent chest x-ray of 04/06/2016, with persistent bilateral perihilar and bibasilar opacities compatible with either pulmonary edema or multifocal pneumonia. No new lung findings. No pleural effusions seen. Heart size and mediastinal contours are stable. Atherosclerotic changes again noted at the aortic arch. Osseous structures appear mottled, particularly about each shoulder, compatible with the mixed sclerotic and lucent lesions described on earlier chest CT of 04/01/2016. IMPRESSION: 1. Persistent perihilar and bibasilar airspace opacities, unchanged compared to the most recent chest x-ray of 04/06/2016, compatible with either pulmonary edema or multifocal pneumonia, favor pulmonary edema. No new lung findings. 2. Mottled appearance of the osseous structures, particularly prominent about each shoulder, compatible with the mixed sclerotic and lucent lesions described on chest CT of 04/01/2016, highly suspicious for metastatic disease. 3. Aortic atherosclerosis. Electronically Signed   By: Franki Cabot M.D.   On: 04/10/2016 19:06    Assessment and   CHF/WEIGHT GAIN/ HYPERKALEMIA- pt's Cr is 1.16, will start lasix 20 mfg daily; pt is on  kayaxalate for hyperkalemia, will cont that  HIP PAIN/myeloproliferative disease- on "pain all over"; will schedule Norco 5 mg q8    Labs/tests ordered: BMP  Time spent > 25 min Anne D. Juan Coil, MD

## 2016-05-04 ENCOUNTER — Encounter: Payer: Self-pay | Admitting: Internal Medicine

## 2016-05-04 ENCOUNTER — Non-Acute Institutional Stay (SKILLED_NURSING_FACILITY): Payer: Medicare Other | Admitting: Internal Medicine

## 2016-05-04 ENCOUNTER — Other Ambulatory Visit: Payer: Self-pay | Admitting: *Deleted

## 2016-05-04 DIAGNOSIS — D471 Chronic myeloproliferative disease: Secondary | ICD-10-CM | POA: Diagnosis not present

## 2016-05-04 DIAGNOSIS — D473 Essential (hemorrhagic) thrombocythemia: Secondary | ICD-10-CM

## 2016-05-04 DIAGNOSIS — E1340 Other specified diabetes mellitus with diabetic neuropathy, unspecified: Secondary | ICD-10-CM

## 2016-05-04 DIAGNOSIS — D72829 Elevated white blood cell count, unspecified: Secondary | ICD-10-CM | POA: Diagnosis not present

## 2016-05-04 DIAGNOSIS — E1142 Type 2 diabetes mellitus with diabetic polyneuropathy: Secondary | ICD-10-CM | POA: Diagnosis not present

## 2016-05-04 DIAGNOSIS — J9601 Acute respiratory failure with hypoxia: Secondary | ICD-10-CM

## 2016-05-04 DIAGNOSIS — Z794 Long term (current) use of insulin: Secondary | ICD-10-CM | POA: Diagnosis not present

## 2016-05-04 DIAGNOSIS — N1330 Unspecified hydronephrosis: Secondary | ICD-10-CM | POA: Diagnosis not present

## 2016-05-04 DIAGNOSIS — N183 Chronic kidney disease, stage 3 (moderate): Secondary | ICD-10-CM | POA: Diagnosis not present

## 2016-05-04 DIAGNOSIS — I471 Supraventricular tachycardia: Secondary | ICD-10-CM

## 2016-05-04 DIAGNOSIS — D75839 Thrombocytosis, unspecified: Secondary | ICD-10-CM

## 2016-05-04 DIAGNOSIS — I5043 Acute on chronic combined systolic (congestive) and diastolic (congestive) heart failure: Secondary | ICD-10-CM | POA: Diagnosis not present

## 2016-05-04 DIAGNOSIS — N179 Acute kidney failure, unspecified: Secondary | ICD-10-CM

## 2016-05-04 NOTE — Progress Notes (Addendum)
Location:  [place] Room Number: [id number] Place of Service:  SNF [phone])  Juan [name]. Juan Coil, MD  PCP: [name], MD Patient Care Team: Colon Branch, MD as PCP - General Roxana Hires, MD as Consulting Physician (Hematology and Oncology) [name], MD as Consulting Physician (Cardiology) [name], MD as Consulting Physician (Hematology and Oncology)  Extended Emergency Contact Information Primary Emergency Contact: Schleicher,Carolyn Address: [place], [place] Mobile Phone: [phone] Relation: Daughter  No Known Allergies  Chief Complaint  Patient presents with  . Discharge Note    Discharged from SNF    HPI:  75 y.o. male with " HTN, DM 2, CAD, nonischemic Myoview 3244, chronic diastolic CHF, AAA, OSA not on C Pap, anemia, thrombocytosis, myeloproliferative disease, recent hospitalization 04/01/16-04/04/16 for community-acquired pneumonia when he was discharged home on levofloxacin but couldn't afford to fill the prescription, was admitted to [facility] on 11/21-12/2  with complaints of cough productive of clear sputum, dyspnea, generalized pain, subjective fevers and chills, diaphoresis, leg swelling, weight gain and gait disturbance." Evaluation in ED revealed BUN 62, creatinine 2.22and chest x-ray  suggesting edema. Pt was treated with IV then po lasix. Hospital course was complicated by acute on CKD3, 2/2 to bladder outlet obstruction and treated with a foley and anemia, tx with IV iron and erythropoietin. Pt with leukocytosis and thrombocytosis c/w myeloproliferative dx. Pt was admitted to SNF with generalized weakness for OT/PT and is now ready to be d/c to home.    Past Medical History:  Diagnosis Date  . Allergic rhinitis   . Anemia   . Ascending aortic aneurysm ([place]) 03/2014   4.3cm on CT scan  . CAD (coronary artery disease)    dx elsewheer in past, no documentation. Non-ischemic myoview 2007   . Chronic diastolic CHF (congestive heart failure), NYHA class 2 (HCC)    Normal EF w/ grade 1 dd by echo 12/2015  . Edema    R>L leg, u/s 5-12 neg for DVT  . Hemorrhoid   . History of thrombocytosis   . Hypertension   . Migraine    "once/wk at least" (07/11/2013)  . Myeloproliferative disease ([place])   . Shortness of breath   . Sinus congestion   . Sleep apnea, obstructive    at some point used CPAP, was d/c  years ago  . Type II diabetes mellitus ([place])     Past Surgical History:  Procedure Laterality Date  . TOE SURGERY Right    "tried to straighten out big toe" (07/11/2013)     reports that he quit smoking about 49 years ago. His smoking use included Cigarettes. He has a 3.00 pack-year smoking history. He has never used smokeless tobacco. He reports that he does not drink alcohol or use drugs. Social History   Social History  . Marital status: Widowed    Spouse name: N/A  . Number of children: 2  . Years of education: N/A   Occupational History  . retired    .  Retired   Social History Main Topics  . Smoking status: Former Smoker    Packs/day: 0.25    Years: 12.00    Types: Cigarettes    Quit date: 05/18/1966  . Smokeless tobacco: Never Used     Comment: quit smoking 45 years ago  . Alcohol use No  . Drug use: No  . Sexual activity: Yes  Other Topics Concern  . Not on file   Social History Narrative   Moved from [place] 2006   Widow 2007   Son lives with him     Pertinent  Health Maintenance Due  Topic Date Due  . URINE MICROALBUMIN  05/31/2009  . OPHTHALMOLOGY EXAM  12/23/2013  . HEMOGLOBIN A1C  06/27/2016  . FOOT EXAM  12/25/2016  . INFLUENZA VACCINE  Completed  . PNA vac Low Risk Adult  Completed    Medications: Allergies as of 05/04/2016   No Known Allergies     Medication List       Accurate as of 05/04/16  4:38 PM. Always use your most recent med list.          acetaminophen 325 MG tablet Commonly known as:  TYLENOL Take 325 mg by  mouth every 6 (six) hours as needed for fever. For fever > or = 101   acetaminophen 500 MG tablet Commonly known as:  TYLENOL Take 1 tablet (500 mg total) by mouth every 6 (six) hours as needed for mild pain, moderate pain, fever or headache. Total acetaminophen dose from all sources not to exceed 4 g per day.   allopurinol 100 MG tablet Commonly known as:  ZYLOPRIM Take 1 tablet (100 mg total) by mouth daily.   aspirin 325 MG tablet Take 325 mg by mouth daily.   bisacodyl 10 MG suppository Commonly known as:  DULCOLAX Place 1 suppository (10 mg total) rectally daily as needed for moderate constipation.   carvedilol 3.125 MG tablet Commonly known as:  COREG Take 1 tablet (3.125 mg total) by mouth 2 (two) times daily.   diclofenac sodium 1 % Gel Commonly known as:  VOLTAREN Apply 4 g topically 2 (two) times daily. Apply to bilateral  knees   feeding supplement (GLUCERNA SHAKE) Liqd Take 237 mLs by mouth daily. Reported on 08/21/2015   feeding supplement (PRO-STAT SUGAR FREE 64) Liqd Take 30 mLs by mouth 2 (two) times daily.   FERROUS GLUCONATE PO Take 240 mg by mouth daily at 10 pm. 1 tablet   fluticasone 50 MCG/ACT nasal spray Commonly known as:  FLONASE Place 2 sprays into both nostrils daily. Reported on 05/02/2015   furosemide 20 MG tablet Commonly known as:  LASIX Take 20 mg by mouth daily.   gabapentin 100 MG capsule Commonly known as:  NEURONTIN Take 100 mg by mouth 3 (three) times daily as needed (pain). Reported on 08/22/2015   guaiFENesin 600 MG 12 hr tablet Commonly known as:  MUCINEX Take 2 tablets (1,200 mg total) by mouth 2 (two) times daily.   hydrocerin Crea Apply Eucerin cream to BLE Q day after bathing and roughly towel drying to remove loose skin   HYDROcodone-acetaminophen 5-325 MG tablet Commonly known as:  NORCO/VICODIN Take 1 tablet by mouth every 8 (eight) hours. routinely   simethicone 80 MG chewable tablet Commonly known as:   MYLICON Chew 80 mg by mouth every 6 (six) hours as needed for flatulence.   sodium polystyrene 15 GM/60ML suspension Commonly known as:  KAYEXALATE Take 15 g by mouth daily.   vitamin B-12 50 MCG tablet Commonly known as:  CYANOCOBALAMIN Take 100 mcg by mouth daily. 2 tablets        Vitals:   05/04/16 1004  BP: 136/80  Pulse: 62  Resp: 17  Temp: 97.8 F (36.6 C)  SpO2: 98%  Weight: 192 lb (87.1 kg)  Height: '6\' 3"'$  (1.905 m)   Body mass  index is 24 kg/m.  Physical Exam  GENERAL APPEARANCE: Alert, conversant. No acute distress.  HEENT: Unremarkable. RESPIRATORY: Breathing is even, unlabored. Lung sounds are clear   CARDIOVASCULAR: Heart RRR no murmurs, rubs or gallops. trace peripheral edema.  GASTROINTESTINAL: Abdomen is soft, non-tender, not distended w/ normal bowel sounds.  NEUROLOGIC: Cranial nerves 2-12 grossly intact. Moves all extremities   Labs reviewed: Basic Metabolic Panel:  Recent Labs  04/15/16 0356 04/16/16 1024 04/17/16 0333 04/17/16 0334 04/21/16 04/24/16  NA 140  140 140 138 138 139 141  K 3.7  3.7 4.0 4.5  --  5.7* 5.3  CL 104 103 101  --   --   --   CO2 '25 26 27  '$ --   --   --   GLUCOSE 111* 117* 138*  --   --   --   BUN 102*  102* 94* 92* 5 75* 64*  CREATININE 2.20*  2.2* 1.72* 1.53* 1.5* 1.3 1.2  CALCIUM 9.4 9.2 9.2  --   --   --   PHOS 4.3 3.5 3.6  --   --   --    Lab Results  Component Value Date   MICROALBUR 0.5 05/31/2008   Liver Function Tests:  Recent Labs  04/01/16 0826 04/07/16 0010 04/14/16 0350 04/15/16 0356 04/16/16 1024 04/17/16 0333  AST '30  30 31  31 25  '$ --   --   --   ALT 14*  14 12*  12 12*  --   --   --   ALKPHOS 78  78 76  76 82  --   --   --   BILITOT 0.4 0.3 0.7  --   --   --   PROT 5.9* 6.3* 5.1*  --   --   --   ALBUMIN 3.0* 3.0* 2.1* 2.1* 2.2* 1.9*   No results for input(s): LIPASE, AMYLASE in the last 8760 hours. No results for input(s): AMMONIA in the last 8760 hours. CBC:  Recent  Labs  04/10/16 0528 04/11/16 0336  04/14/16 0350 04/15/16 0356 04/17/16 0334 04/21/16 04/24/16  WBC 14.3* 15.0*  < > 18.4  18.4* 22.5  22.5* 30.9  30.9* 29.3 20.3  NEUTROABS 9.2* 9.5*  --   --  14.9*  --   --   --   HGB 8.7* 8.9*  < > 7.4*  7.4* 7.9*  7.9* 8.0* 8.7* 9.5*  HCT 26.5* 27.2*  < > 22.9*  23* 24.7*  25* 25.8* 27* 31*  MCV 77.7* 76.8*  < > 76.6* 78.4 78.9  --   --   PLT 575* 498*  < > 491*  491* 565*  565* 714* 653* 558*  < > = values in this interval not displayed. Lipid  Recent Labs  12/26/15 1036  CHOL 129  HDL 38.10*  LDLCALC 73  TRIG 86.0   Cardiac Enzymes:  Recent Labs  04/07/16 0532 04/07/16 1148 04/07/16 1821  TROPONINI <0.03 0.10* <0.03   BNP:  Recent Labs  12/29/15 1607 04/01/16 0426 04/07/16 0010  BNP 228.8* 325.1* 137.1*   CBG:  Recent Labs  04/03/16 2022 04/04/16 0811 04/04/16 1151  GLUCAP 116* 99 94    Procedures and Imaging Studies During Stay: Ct Abdomen Pelvis Wo Contrast  Result Date: 04/15/2016 CLINICAL DATA:  Evaluate hydronephrosis. EXAM: CT ABDOMEN AND PELVIS WITHOUT CONTRAST TECHNIQUE: Multidetector CT imaging of the abdomen and pelvis was performed following the standard protocol without IV contrast. COMPARISON:  None. FINDINGS: Lower  chest: There is subsegmental atelectasis within the lung bases. There is also a mosaic attenuation pattern within the visualized portions of the lungs. No pleural effusion noted. Hepatobiliary: No focal liver abnormality is seen. No gallstones, gallbladder wall thickening, or biliary dilatation.There is subsegmental atelectasis noted in the lung bases. Pancreas: Unremarkable. No pancreatic ductal dilatation or surrounding inflammatory changes. Spleen: The spleen is enlarged measuring 15.4 cm in length. Adrenals/Urinary Tract: Normal appearance of the adrenal glands. Cyst within the inferior pole the right kidney is incompletely characterized without IV contrast measuring 3 cm. No renal  calculi identified. Very mild bilateral pelvocaliectasis identified. No evidence for high-grade obstructive uropathy. The urinary bladder is collapsed around a Foley catheter. Stomach/Bowel: The stomach is normal. The small bowel loops have a normal course and caliber. Numerous colonic diverticula identified. No evidence for acute diverticulitis. Moderate stool burden is noted within the rectum. Vascular/Lymphatic: Aortic atherosclerosis. No adenopathy identified within the abdomen or the pelvis. Reproductive: Prostate is unremarkable. Other: No free fluid or fluid collections. Musculoskeletal: The bones are diffusely sclerotic compatible with widespread osseous metastatic disease. IMPRESSION: 1. Interval decompression of bilateral renal collecting systems with mild residual pelvocaliectasis status post catheter placement. At this time there is no evidence for high-grade obstructive uropathy of either kidney. 2. Splenomegaly. 3. Diffuse sclerotic bone metastases. 4. Aortic atherosclerosis. Electronically Signed   By: Kerby Moors M.D.   On: 04/15/2016 10:25   Dg Chest 2 View  Result Date: 04/06/2016 CLINICAL DATA:  Shortness of breath. Sore back radiating to both flanks. Discharge from [facility] on Saturday with diagnosis of pneumonia. Continued fever and chills. EXAM: CHEST  2 VIEW COMPARISON:  04/01/2016 FINDINGS: Shallow inspiration with linear atelectasis in the lung bases. Cardiac enlargement. Diffuse airspace opacities in the lungs consistent with edema or multifocal pneumonia. Similar appearance to previous study. No blunting of costophrenic angles. No pneumothorax. Calcified aorta. Thoracic scoliosis convex towards the right. IMPRESSION: Cardiac enlargement. Diffuse airspace disease suggesting edema or pneumonia. Electronically Signed   By: Lucienne Capers M.D.   On: 04/06/2016 23:55   Dg Wrist Complete Right  Result Date: 04/15/2016 CLINICAL DATA:  Acute right wrist pain at the radial  aspect of the wrist as well as the proximal second and third metacarpals for 1 day. Right arm weakness. Initial encounter. EXAM: RIGHT WRIST - COMPLETE 3+ VIEW COMPARISON:  None. FINDINGS: There is no evidence of acute fracture or dislocation. No focal osseous lesion or osseous erosion is seen. Joint space widths are preserved. No focal soft tissue abnormality is identified. IMPRESSION: No evidence of acute osseous abnormality. Electronically Signed   By: Logan Bores M.D.   On: 04/15/2016 19:25   Nm Bone Scan Whole Body  Result Date: 04/17/2016 CLINICAL DATA:  Diffuse bone pain.  History of myelofibrosis. EXAM: NUCLEAR MEDICINE WHOLE BODY BONE SCAN TECHNIQUE: Whole body anterior and posterior images were obtained approximately 3 hours after intravenous injection of radiopharmaceutical. RADIOPHARMACEUTICALS:  25.2 mCi Technetium-53mMDP IV COMPARISON:  Chest CT 04/01/2016.  CT abdomen and pelvis 04/14/2016. FINDINGS: There is diffusely increased radiotracer uptake throughout nearly the entirety of the axial and visualized appendicular skeleton with relative sparing of the skull. Symmetrically increased uptake is present about the hips, knees, ankles, and shoulders. The kidneys are only faintly visualized without evidence of significant radiotracer excretion into the collecting system or bladder, and this may reflect a combination of renal failure and the diffuse osseous tracer uptake. IMPRESSION: Diffusely abnormal uptake throughout the skeleton which is  compatible with the patient's history of myelofibrosis. Diffuse osseous metastatic disease from a non-hematologic primary malignancy and metabolic bone diseases can also give this appearance. Electronically Signed   By: Logan Bores M.D.   On: 04/17/2016 15:12   US Renal  Result Date: 04/13/2016 CLINICAL DATA:  Acute on chronic renal failure EXAM: RENAL / URINARY TRACT ULTRASOUND COMPLETE COMPARISON:  04/03/2014 FINDINGS: Right Kidney: Length: 11.1 cm.  Increased echotexture. 2.8 cm lower pole cyst. Mild right hydronephrosis. Left Kidney: Length: 12.7 cm. Increased echotexture. There is severe left hydronephrosis. 1.4 cm echogenic, shadowing area in the lower pole, likely nonobstructing left renal stone. Bladder: Multiple large cystic areas within the pelvis which appear to be contiguous with the bladder compatible with diverticula and trabeculations, stable. IMPRESSION: Mild right hydronephrosis and severe left hydronephrosis. 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 Probable 14 mm nonobstructing left lower pole renal stone. Increased echotexture in the kidneys compatible with chronic medical renal disease. Electronically Signed   By: Rolm Baptise M.D.   On: 04/13/2016 13:01   Dg Chest Port 1 View  Result Date: 04/10/2016 CLINICAL DATA:  Acute respiratory failure with hypoxia. Pulmonary edema versus other infectious or leukemic infiltrates. EXAM: PORTABLE CHEST 1 VIEW COMPARISON:  Chest x-rays dated 04/06/2016, 04/01/2016, 12/29/2015 and 01/21/2015. FINDINGS: No significant change compared to the most recent chest x-ray of 04/06/2016, with persistent bilateral perihilar and bibasilar opacities compatible with either pulmonary edema or multifocal pneumonia. No new lung findings. No pleural effusions seen. Heart size and mediastinal contours are stable. Atherosclerotic changes again noted at the aortic arch. Osseous structures appear mottled, particularly about each shoulder, compatible with the mixed sclerotic and lucent lesions described on earlier chest CT of 04/01/2016. IMPRESSION: 1. Persistent perihilar and bibasilar airspace opacities, unchanged compared to the most recent chest x-ray of 04/06/2016, compatible with either pulmonary edema or multifocal pneumonia, favor pulmonary edema. No new lung findings. 2. Mottled appearance of the osseous structures, particularly prominent about each shoulder, compatible with the  mixed sclerotic and lucent lesions described on chest CT of 04/01/2016, highly suspicious for metastatic disease. 3. Aortic atherosclerosis. Electronically Signed   By: Franki Cabot M.D.   On: 04/10/2016 19:06    Assessment/Plan:   Acute respiratory failure with hypoxia (HCC)  Paroxysmal SVT (supraventricular tachycardia) (HCC)  Systolic and diastolic CHF, acute on chronic (HCC)  Type 2 diabetes mellitus with diabetic polyneuropathy, with long-term current use of insulin (HCC)  Neuropathy due to secondary diabetes (HCC)  Leukocytosis, unspecified type  Myeloproliferative disease (HCC)  Thrombocytosis (HCC)  Hydronephrosis, unspecified hydronephrosis type  Acute renal failure superimposed on stage 3 chronic kidney disease, unspecified acute renal failure type [facility])   Patient is being discharged with the following home health services:  HH/OT/PT/Nursing  Patient is being discharged with the following durable medical equipment:  Mechanical lift  Patient has been advised to f/u with their PCP in 1-2 weeks to bring them up to date on their rehab stay.  Social services at facility was responsible for arranging this appointment.  Pt was provided with a 30 day supply of prescriptions for medications and refills must be obtained from their PCP.  For controlled substances, a more limited supply may be provided adequate until PCP appointment only.    Time spent > 30 min;> 50% of time with patient was spent reviewing records, labs, tests and studies, counseling and developing plan of care  Juan [name]. Juan Coil, MD

## 2016-05-04 NOTE — Patient Outreach (Signed)
[facility]) Care Management  05/04/2016  Diaz Crago 1940-12-21 [id number]   Met with patient and son at facility. Patient did not want to sign up for [facility] services at this time, he has our brochure. Son given a brochure and magnet for future reference. Reviewed program services.  Patient focused on getting to see his urologist and getting catheter out.  RNCM discussed discharge, Patient at first states he is not sure when he is going home. Spoke with SW, who confirmed that patient to discharge tomorrow.   SW and RNCM spoke with patient and son again regarding discharge plans tomorrow. Patient voiced he did not understand he was discharging because he still has the catheter. SW will arrange for home care, DME and transporation services.   Plan to sign off case as patient does not want to sign up at this time, stating he will call us for future needs. Patient has [facility] program contact information.  Royetta Crochet. Laymond Purser, RN, BSN, [place] Acute Care Coordinator [phone]

## 2016-05-05 ENCOUNTER — Encounter (HOSPITAL_COMMUNITY): Payer: Self-pay | Admitting: Emergency Medicine

## 2016-05-05 ENCOUNTER — Emergency Department (HOSPITAL_COMMUNITY): Payer: Medicare Other

## 2016-05-05 ENCOUNTER — Inpatient Hospital Stay (HOSPITAL_COMMUNITY)
Admission: EM | Admit: 2016-05-05 | Discharge: 2016-05-12 | DRG: 291 | Disposition: A | Discharge status: Skilled Nursing Facility | Payer: Medicare Other | Attending: Internal Medicine | Admitting: Internal Medicine

## 2016-05-05 DIAGNOSIS — E119 Type 2 diabetes mellitus without complications: Secondary | ICD-10-CM

## 2016-05-05 DIAGNOSIS — R161 Splenomegaly, not elsewhere classified: Secondary | ICD-10-CM | POA: Diagnosis present

## 2016-05-05 DIAGNOSIS — E1122 Type 2 diabetes mellitus with diabetic chronic kidney disease: Secondary | ICD-10-CM | POA: Diagnosis present

## 2016-05-05 DIAGNOSIS — I714 Abdominal aortic aneurysm, without rupture: Secondary | ICD-10-CM | POA: Diagnosis present

## 2016-05-05 DIAGNOSIS — D473 Essential (hemorrhagic) thrombocythemia: Secondary | ICD-10-CM | POA: Diagnosis present

## 2016-05-05 DIAGNOSIS — M25531 Pain in right wrist: Secondary | ICD-10-CM | POA: Diagnosis present

## 2016-05-05 DIAGNOSIS — C946 Myelodysplastic disease, not classified: Secondary | ICD-10-CM | POA: Diagnosis not present

## 2016-05-05 DIAGNOSIS — Z818 Family history of other mental and behavioral disorders: Secondary | ICD-10-CM

## 2016-05-05 DIAGNOSIS — Z87891 Personal history of nicotine dependence: Secondary | ICD-10-CM | POA: Diagnosis not present

## 2016-05-05 DIAGNOSIS — N189 Chronic kidney disease, unspecified: Secondary | ICD-10-CM | POA: Diagnosis present

## 2016-05-05 DIAGNOSIS — J309 Allergic rhinitis, unspecified: Secondary | ICD-10-CM | POA: Diagnosis present

## 2016-05-05 DIAGNOSIS — N401 Enlarged prostate with lower urinary tract symptoms: Secondary | ICD-10-CM | POA: Diagnosis present

## 2016-05-05 DIAGNOSIS — I712 Thoracic aortic aneurysm, without rupture: Secondary | ICD-10-CM | POA: Diagnosis present

## 2016-05-05 DIAGNOSIS — J189 Pneumonia, unspecified organism: Secondary | ICD-10-CM | POA: Diagnosis present

## 2016-05-05 DIAGNOSIS — I5033 Acute on chronic diastolic (congestive) heart failure: Secondary | ICD-10-CM | POA: Diagnosis present

## 2016-05-05 DIAGNOSIS — I509 Heart failure, unspecified: Secondary | ICD-10-CM | POA: Diagnosis not present

## 2016-05-05 DIAGNOSIS — M25532 Pain in left wrist: Secondary | ICD-10-CM | POA: Diagnosis present

## 2016-05-05 DIAGNOSIS — R918 Other nonspecific abnormal finding of lung field: Secondary | ICD-10-CM | POA: Diagnosis not present

## 2016-05-05 DIAGNOSIS — Z7982 Long term (current) use of aspirin: Secondary | ICD-10-CM

## 2016-05-05 DIAGNOSIS — D75839 Thrombocytosis, unspecified: Secondary | ICD-10-CM | POA: Diagnosis present

## 2016-05-05 DIAGNOSIS — R339 Retention of urine, unspecified: Secondary | ICD-10-CM | POA: Diagnosis present

## 2016-05-05 DIAGNOSIS — N183 Chronic kidney disease, stage 3 (moderate): Secondary | ICD-10-CM | POA: Diagnosis not present

## 2016-05-05 DIAGNOSIS — G4733 Obstructive sleep apnea (adult) (pediatric): Secondary | ICD-10-CM | POA: Diagnosis present

## 2016-05-05 DIAGNOSIS — I471 Supraventricular tachycardia: Secondary | ICD-10-CM | POA: Diagnosis not present

## 2016-05-05 DIAGNOSIS — Z791 Long term (current) use of non-steroidal anti-inflammatories (NSAID): Secondary | ICD-10-CM

## 2016-05-05 DIAGNOSIS — G43909 Migraine, unspecified, not intractable, without status migrainosus: Secondary | ICD-10-CM | POA: Diagnosis present

## 2016-05-05 DIAGNOSIS — R531 Weakness: Secondary | ICD-10-CM

## 2016-05-05 DIAGNOSIS — I2510 Atherosclerotic heart disease of native coronary artery without angina pectoris: Secondary | ICD-10-CM | POA: Diagnosis present

## 2016-05-05 DIAGNOSIS — D649 Anemia, unspecified: Secondary | ICD-10-CM | POA: Diagnosis present

## 2016-05-05 DIAGNOSIS — N320 Bladder-neck obstruction: Secondary | ICD-10-CM | POA: Diagnosis present

## 2016-05-05 DIAGNOSIS — K649 Unspecified hemorrhoids: Secondary | ICD-10-CM | POA: Diagnosis present

## 2016-05-05 DIAGNOSIS — E1140 Type 2 diabetes mellitus with diabetic neuropathy, unspecified: Secondary | ICD-10-CM | POA: Diagnosis present

## 2016-05-05 DIAGNOSIS — N323 Diverticulum of bladder: Secondary | ICD-10-CM | POA: Diagnosis present

## 2016-05-05 DIAGNOSIS — R3914 Feeling of incomplete bladder emptying: Secondary | ICD-10-CM | POA: Diagnosis present

## 2016-05-05 DIAGNOSIS — M109 Gout, unspecified: Secondary | ICD-10-CM | POA: Diagnosis present

## 2016-05-05 DIAGNOSIS — N400 Enlarged prostate without lower urinary tract symptoms: Secondary | ICD-10-CM | POA: Diagnosis present

## 2016-05-05 DIAGNOSIS — Z79891 Long term (current) use of opiate analgesic: Secondary | ICD-10-CM

## 2016-05-05 DIAGNOSIS — I130 Hypertensive heart and chronic kidney disease with heart failure and stage 1 through stage 4 chronic kidney disease, or unspecified chronic kidney disease: Secondary | ICD-10-CM | POA: Diagnosis not present

## 2016-05-05 DIAGNOSIS — Z79899 Other long term (current) drug therapy: Secondary | ICD-10-CM

## 2016-05-05 LAB — COMPREHENSIVE METABOLIC PANEL
ALK PHOS: 160 U/L — AB (ref 38–126)
ALT: 16 U/L — ABNORMAL LOW (ref 17–63)
ANION GAP: 10 (ref 5–15)
AST: 24 U/L (ref 15–41)
Albumin: 2.5 g/dL — ABNORMAL LOW (ref 3.5–5.0)
BUN: 26 mg/dL — ABNORMAL HIGH (ref 6–20)
CALCIUM: 9.1 mg/dL (ref 8.9–10.3)
CHLORIDE: 109 mmol/L (ref 101–111)
CO2: 21 mmol/L — AB (ref 22–32)
Creatinine, Ser: 0.9 mg/dL (ref 0.61–1.24)
GFR calc non Af Amer: >60 mL/min (ref >60)
Glucose, Bld: 115 mg/dL — ABNORMAL HIGH (ref 65–99)
Potassium: 3.7 mmol/L (ref 3.5–5.1)
Sodium: 140 mmol/L (ref 135–145)
Total Bilirubin: 0.2 mg/dL — ABNORMAL LOW (ref 0.3–1.2)
Total Protein: 5.7 g/dL — ABNORMAL LOW (ref 6.5–8.1)

## 2016-05-05 LAB — CBC
HCT: 25.7 % — ABNORMAL LOW (ref 39.0–52.0)
Hemoglobin: 8 g/dL — ABNORMAL LOW (ref 13.0–17.0)
MCH: 25.4 pg — ABNORMAL LOW (ref 26.0–34.0)
MCHC: 31.1 g/dL (ref 30.0–36.0)
MCV: 81.6 fL (ref 78.0–100.0)
PLATELETS: 562 10*3/uL — AB (ref 150–400)
RBC: 3.15 MIL/uL — AB (ref 4.22–5.81)
RDW: 20.8 % — ABNORMAL HIGH (ref 11.5–15.5)
WBC: 13.4 10*3/uL — AB (ref 4.0–10.5)

## 2016-05-05 LAB — I-STAT TROPONIN, ED: TROPONIN I, POC: 0.02 ng/mL (ref 0.00–0.08)

## 2016-05-05 LAB — BRAIN NATRIURETIC PEPTIDE: B NATRIURETIC PEPTIDE 5: 182.5 pg/mL — AB (ref 0.0–100.0)

## 2016-05-05 MED ORDER — HYDROCODONE-ACETAMINOPHEN 5-325 MG PO TABS
1.0000 | ORAL_TABLET | Freq: Once | ORAL | Status: AC
  Administered 2016-05-05: 1 via ORAL
  Filled 2016-05-05: qty 1

## 2016-05-05 MED ORDER — FUROSEMIDE 10 MG/ML IJ SOLN: 40.0000 mg | INTRAMUSCULAR | Status: DC

## 2016-05-05 MED ORDER — FUROSEMIDE 10 MG/ML IJ SOLN
40.0000 mg | Freq: Once | INTRAMUSCULAR | Status: AC
  Administered 2016-05-05: 40 mg via INTRAVENOUS
  Filled 2016-05-05: qty 4

## 2016-05-05 NOTE — ED Provider Notes (Signed)
The patient is a 75 year old male, known congestive heart failure, known chronic kidney disease, known myeloproliferative disorder and myelofibrosis who was recently admitted to the hospital as of 3 weeks ago with community-acquired pneumonia and congestive heart failure with urinary retention causing acute on chronic renal insufficiency and requiring a Foley catheter to decompress the bladder. He presents to the hospital today complaining that his legs are more swollen and he hurts all over. Upon examining the patient does indeed have bilateral lower extremity edema left greater than right, Foley catheter is in place draining the bladder appropriately, multi-joint arthropathy including the right wrist, this is not new in view of the medical record shows that there has been a myeloproliferative disorder that is likely causing some bony distraction of the wrist versus gout and he was hyperuricemic in the past. The patient will need a chest x-ray to make sure the pneumonia has resolved, labs to evaluate renal function and some pain control with possible diuresis. He does not appear acutely ill  In the end the pt refused to go home stating that he couldn't walk.  He has only been out of SNF for 24 hours and feels like he is unsafe - he has pulm edema on xray - willa dmit for observation   EKG Interpretation  Date/Time:  Tuesday May 05 2016 19:34:17 EST Ventricular Rate:  98 PR Interval:    QRS Duration: 99 QT Interval:  343 QTC Calculation: 438 R Axis:   -16 Text Interpretation:  Sinus rhythm Atrial premature complex Borderline left axis deviation Nonspecific T abnormalities, anterior leads since last tracing no significant change Confirmed by Urijah Raynor  MD, Renny Gunnarson ([id number]) on 05/05/2016 8:21:49 PM        Medical screening examination/treatment/procedure(s) were conducted as a shared visit with non-physician practitioner(s) and myself.  I personally evaluated the patient during the  encounter.  Clinical Impression:   Final diagnoses:  Acute congestive heart failure, unspecified congestive heart failure type ([place])         Noemi Chapel, MD 05/06/16 [phone]

## 2016-05-05 NOTE — ED Notes (Signed)
Iv attempts x2 unsuccessful.

## 2016-05-05 NOTE — ED Notes (Signed)
Pt reports that he is having edema in all extremities. Denies pain to those areas however he is having pain to his lower back not associated with the swelling.

## 2016-05-05 NOTE — ED Provider Notes (Signed)
[facility] DEPT Provider Note   CSN: [id number] Arrival date & time: 05/05/16  8882     History   Chief Complaint Chief Complaint  Patient presents with  . Edema    HPI Juan [name] is a 75 y.o. male.  Pt is a 75 y/o M with PMH of HTN, type 2 DM, CAD, CHF, AAA, OSA, anemia, thrombocytosis, myeloproliferative disease who presents to ED for swelling to bilateral Ue, Le, and generalized pain, states onset yesterday, has been med compliant with Lasix, no recent dosage changes. No recent trauma or known injury. Recently admitted to hospital approx 3 weeks ago for CAP and CHF with urinary retention causing acute on chronic renal insufficiency and requiring a Foley catheter for bladder decompression. Denies fevers, chills, dizziness, vision changes, Cp, SOB, cough, hemoptysis, abd pain, n/v/d, dysuria, extremity numbness/tingling, or any additional concerns.    The history is provided by the patient. No language interpreter was used.    Past Medical History:  Diagnosis Date  . Allergic rhinitis   . Anemia   . Ascending aortic aneurysm ([place]) 03/2014   4.3cm on CT scan  . CAD (coronary artery disease)    dx elsewheer in past, no documentation. Non-ischemic myoview 2007  . Chronic diastolic CHF (congestive heart failure), NYHA class 2 (HCC)    Normal EF w/ grade 1 dd by echo 12/2015  . Edema    R>L leg, u/s 5-12 neg for DVT  . Hemorrhoid   . History of thrombocytosis   . Hypertension   . Migraine    "once/wk at least" (07/11/2013)  . Myeloproliferative disease ([place])   . Shortness of breath   . Sinus congestion   . Sleep apnea, obstructive    at some point used CPAP, was d/c  years ago  . Type II diabetes mellitus [facility])     Patient Active Problem List   Diagnosis Date Noted  . Paroxysmal SVT (supraventricular tachycardia) ([place]) 04/20/2016  . Type II diabetes mellitus ([place]) 04/20/2016  . Gout 04/20/2016  . Hyperuricemia 04/20/2016  . B12 deficiency 04/20/2016  .  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 Constipation   . Acute on chronic renal failure ([place])   . Hydronephrosis   . Systolic and diastolic CHF, acute on chronic ([place]) 04/08/2016  . Acute respiratory failure with hypoxia ([place]) 04/08/2016  . CHF exacerbation ([place]) 04/07/2016  . Acute renal failure superimposed on chronic kidney disease ([place]) 04/07/2016  . Thrombocytosis ([place]) 04/07/2016  . Anemia 04/07/2016  . Community acquired pneumonia 04/01/2016  . Pneumonia 04/01/2016  . Pressure injury of skin 04/01/2016  . Pressure ulcer stage II 12/31/2015  . UTI (urinary tract infection) 12/30/2015  . Sepsis ([place]) 12/29/2015  . UTI (lower urinary tract infection) 12/29/2015  . T wave inversion in EKG 12/29/2015  . Goals of care, counseling/discussion 05/02/2015  . Acute on chronic systolic congestive heart failure ([place]) 02/14/2015  . CKD (chronic kidney disease), stage III 01/22/2015  . Pain in limb   . Peripheral edema   . Unable to ambulate   . Thoracic aortic aneurysm ([place]) 04/26/2014  . CKD (chronic kidney disease) stage 3, GFR 30-59 ml/min 04/26/2014  . 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 Chest pain 04/15/2014  . Encounter for palliative care 04/06/2014  . Weakness generalized 04/06/2014  . Venous stasis dermatitis of both lower extremities   . Malnutrition of moderate degree ([place]) 03/31/2014  . Morbid obesity due to excess calories ([place]) 03/29/2014  .  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 Leukocytosis 10/17/2013  . Poor compliance with advise  05/05/2012  . Annual physical exam 01/14/2012  . Weight loss 01/14/2012  . BPH (benign prostatic hyperplasia) 01/14/2012  . *Chronic lower extremity edema, stasis dermatitis 06/09/2011  . CARDIOMEGALY 04/29/2009  . *Prediabetes 09/07/2006  . Symptomatic anemia 09/07/2006  . Myeloproliferative disease ([place]) 09/07/2006  . Obstructive sleep apnea 09/07/2006  . Essential hypertension  09/07/2006  . Coronary atherosclerosis 09/07/2006  . *CHF-- PCP comments 09/07/2006  . Allergic rhinitis 09/07/2006    Past Surgical History:  Procedure Laterality Date  . TOE SURGERY Right    "tried to straighten out big toe" (07/11/2013)       Home Medications    Prior to Admission medications   Medication Sig Start Date End Date Taking? Authorizing Provider  acetaminophen (TYLENOL) 325 MG tablet Take 325 mg by mouth every 6 (six) hours as needed for fever. For fever > or = 101    Historical Provider, MD  acetaminophen (TYLENOL) 500 MG tablet Take 1 tablet (500 mg total) by mouth every 6 (six) hours as needed for mild pain, moderate pain, fever or headache. Total acetaminophen dose from all sources not to exceed 4 g per day. 04/18/16   Modena Jansky, MD  allopurinol (ZYLOPRIM) 100 MG tablet Take 1 tablet (100 mg total) by mouth daily. 04/19/16   Modena Jansky, MD  Amino Acids-Protein Hydrolys (FEEDING SUPPLEMENT, PRO-STAT SUGAR FREE 64,) LIQD Take 30 mLs by mouth 2 (two) times daily.    Historical Provider, MD  aspirin 325 MG tablet Take 325 mg by mouth daily.    Historical Provider, MD  bisacodyl (DULCOLAX) 10 MG suppository Place 1 suppository (10 mg total) rectally daily as needed for moderate constipation. 04/18/16   Modena Jansky, MD  carvedilol (COREG) 3.125 MG tablet Take 1 tablet (3.125 mg total) by mouth 2 (two) times daily. 04/18/16   Modena Jansky, MD  diclofenac sodium (VOLTAREN) 1 % GEL Apply 4 g topically 2 (two) times daily. Apply to bilateral  knees    Historical Provider, MD  feeding supplement, GLUCERNA SHAKE, (GLUCERNA SHAKE) LIQD Take 237 mLs by mouth daily. Reported on 08/21/2015    Historical Provider, MD  FERROUS GLUCONATE PO Take 240 mg by mouth daily at 10 pm. 1 tablet    Historical Provider, MD  fluticasone (FLONASE) 50 MCG/ACT nasal spray Place 2 sprays into both nostrils daily. Reported on 05/02/2015    Historical Provider, MD  furosemide (LASIX) 20 MG  tablet Take 20 mg by mouth daily.    Historical Provider, MD  gabapentin (NEURONTIN) 100 MG capsule Take 100 mg by mouth 3 (three) times daily as needed (pain). Reported on 08/22/2015    Historical Provider, MD  guaiFENesin (MUCINEX) 600 MG 12 hr tablet Take 2 tablets (1,200 mg total) by mouth 2 (two) times daily. 04/04/16   Verlee Monte, MD  hydrocerin (EUCERIN) CREA Apply Eucerin cream to BLE Q day after bathing and roughly towel drying to remove loose skin 01/27/14   Jonetta Osgood, MD  HYDROcodone-acetaminophen (NORCO/VICODIN) 5-325 MG tablet Take 1 tablet by mouth every 8 (eight) hours. routinely    Historical Provider, MD  simethicone (MYLICON) 80 MG chewable tablet Chew 80 mg by mouth every 6 (six) hours as needed for flatulence.    Historical Provider, MD  sodium polystyrene (KAYEXALATE) 15 GM/60ML suspension Take 15 g by mouth daily.     Historical Provider, MD  vitamin  B-12 (CYANOCOBALAMIN) 50 MCG tablet Take 100 mcg by mouth daily. 2 tablets    Historical Provider, MD    Family History Family History  Problem Relation Age of Onset  . Schizophrenia Son   . Colon cancer Neg Hx   . Prostate cancer Neg Hx   . Heart attack Neg Hx   . Diabetes Neg Hx     Social History Social History  Substance Use Topics  . Smoking status: Former Smoker    Packs/day: 0.25    Years: 12.00    Types: Cigarettes    Quit date: 05/18/1966  . Smokeless tobacco: Never Used     Comment: quit smoking 45 years ago  . Alcohol use No     Allergies   Patient has no known allergies.   Review of Systems Review of Systems  Constitutional: Negative for chills, fever and unexpected weight change.  HENT: Negative for congestion, ear pain and sore throat.   Respiratory: Negative for cough and shortness of breath.   Cardiovascular: Positive for leg swelling. Negative for chest pain and palpitations.  Gastrointestinal: Negative for abdominal pain, diarrhea, nausea and vomiting.  Genitourinary: Negative for  dysuria.  Musculoskeletal: Positive for arthralgias. Negative for neck pain and neck stiffness.  Skin: Negative for rash.  Neurological: Negative for dizziness, numbness and headaches.     Physical Exam Updated Vital Signs BP 159/85 (BP Location: Right Arm)   Pulse 98   Temp 98.7 F (37.1 C) (Oral)   Resp 20   Ht '6\' 3"'$  (1.905 m)   Wt 86.2 kg   SpO2 96%   BMI 23.75 kg/m   Physical Exam  Constitutional: He is oriented to person, place, and time. He appears well-developed.  HENT:  Head: Normocephalic and atraumatic.  Eyes: Conjunctivae and EOM are normal. Pupils are equal, round, and reactive to light.  Neck: Normal range of motion. Neck supple.  Cardiovascular: Normal rate, regular rhythm, normal heart sounds and intact distal pulses.   Pulmonary/Chest: Effort normal. He has rales.  Abdominal: Soft. Bowel sounds are normal.  Musculoskeletal: He exhibits edema (3+ pitting edema to bilateral LE).  Neurological: He is alert and oriented to person, place, and time.  Skin: Skin is warm and dry.  Nursing note and vitals reviewed.    ED Treatments / Results  Labs (all labs ordered are listed, but only abnormal results are displayed) Labs Reviewed  [place], ED    EKG  EKG Interpretation  Date/Time:  Tuesday May 05 2016 19:34:17 EST Ventricular Rate:  98 PR Interval:    QRS Duration: 99 QT Interval:  343 QTC Calculation: 438 R Axis:   -16 Text Interpretation:  Sinus rhythm Atrial premature complex Borderline left axis deviation Nonspecific T abnormalities, anterior leads since last tracing no significant change Confirmed by MILLER  MD, BRIAN ([id number]) on 05/05/2016 8:21:49 PM       Radiology No results found.  Procedures Procedures (including critical care time)  Medications Ordered in ED Medications - No data to display   Initial Impression / Assessment and Plan / ED Course  I have  reviewed the triage vital signs and the nursing notes.  Pertinent labs & imaging results that were available during my care of the patient were reviewed by me and considered in my medical decision making (see chart for details).  Clinical Course    Pt is a 75 y/o M who presents to ED for swelling  to bilateral UE and LE with generalized pain. Will plan to get labs for renal function, CXR for pna/pulmonary edema. Plan for pain control and re-eval.   11:44 PM Pt updated on results, IV lasix given, hospitalist paged for admission.   Final Clinical Impressions(s) / ED Diagnoses   Final diagnoses:  None    New Prescriptions New Prescriptions   No medications on file     Ulice Bold, NP 05/05/16 2345    Noemi Chapel, MD 05/06/16 [phone]

## 2016-05-05 NOTE — ED Triage Notes (Signed)
EMS reports the patient is coming from home for edema and pain all over. EMS reports the patient had pneumonia and was admitted 3 weeks ago. EMS reports the patient has had increasing swelling since discharge. EMS states the patient has CHF, catheter in place, and been taking his lasix as prescribed. Vital signs 164/102, P-95, R-20, 96% on RA.

## 2016-05-05 NOTE — Care Management (Signed)
CM met with patient at bedside. Patient reports being discharged from a SNF today, and not being able to ambulate. Patient states he was there for 20 days and did not receive enough therapy CM explained Medicare covers Rehab at 100% for up to 20 days.  If he felt he needed additional days it would be self pay. He states, he is not able to afford to self pay. CM explained that we could arrange [facility] services for transitional care. Patient continues to state he is unable to walk. Nursing unable to get patient up at this time. PT eval recommended. Updated Andee Poles NP.

## 2016-05-06 DIAGNOSIS — I2510 Atherosclerotic heart disease of native coronary artery without angina pectoris: Secondary | ICD-10-CM | POA: Diagnosis present

## 2016-05-06 DIAGNOSIS — D649 Anemia, unspecified: Secondary | ICD-10-CM | POA: Diagnosis present

## 2016-05-06 DIAGNOSIS — R41841 Cognitive communication deficit: Secondary | ICD-10-CM | POA: Diagnosis not present

## 2016-05-06 DIAGNOSIS — I5033 Acute on chronic diastolic (congestive) heart failure: Secondary | ICD-10-CM

## 2016-05-06 DIAGNOSIS — D473 Essential (hemorrhagic) thrombocythemia: Secondary | ICD-10-CM | POA: Diagnosis not present

## 2016-05-06 DIAGNOSIS — N390 Urinary tract infection, site not specified: Secondary | ICD-10-CM | POA: Diagnosis not present

## 2016-05-06 DIAGNOSIS — R339 Retention of urine, unspecified: Secondary | ICD-10-CM | POA: Diagnosis present

## 2016-05-06 DIAGNOSIS — I711 Thoracic aortic aneurysm, ruptured: Secondary | ICD-10-CM | POA: Diagnosis not present

## 2016-05-06 DIAGNOSIS — E1142 Type 2 diabetes mellitus with diabetic polyneuropathy: Secondary | ICD-10-CM | POA: Diagnosis not present

## 2016-05-06 DIAGNOSIS — Z794 Long term (current) use of insulin: Secondary | ICD-10-CM | POA: Diagnosis not present

## 2016-05-06 DIAGNOSIS — C946 Myelodysplastic disease, not classified: Secondary | ICD-10-CM | POA: Diagnosis present

## 2016-05-06 DIAGNOSIS — Z791 Long term (current) use of non-steroidal anti-inflammatories (NSAID): Secondary | ICD-10-CM | POA: Diagnosis not present

## 2016-05-06 DIAGNOSIS — R278 Other lack of coordination: Secondary | ICD-10-CM | POA: Diagnosis not present

## 2016-05-06 DIAGNOSIS — I10 Essential (primary) hypertension: Secondary | ICD-10-CM | POA: Diagnosis not present

## 2016-05-06 DIAGNOSIS — R2689 Other abnormalities of gait and mobility: Secondary | ICD-10-CM | POA: Diagnosis not present

## 2016-05-06 DIAGNOSIS — E1140 Type 2 diabetes mellitus with diabetic neuropathy, unspecified: Secondary | ICD-10-CM | POA: Diagnosis present

## 2016-05-06 DIAGNOSIS — M109 Gout, unspecified: Secondary | ICD-10-CM | POA: Diagnosis present

## 2016-05-06 DIAGNOSIS — M25532 Pain in left wrist: Secondary | ICD-10-CM | POA: Diagnosis present

## 2016-05-06 DIAGNOSIS — E1122 Type 2 diabetes mellitus with diabetic chronic kidney disease: Secondary | ICD-10-CM | POA: Diagnosis present

## 2016-05-06 DIAGNOSIS — N189 Chronic kidney disease, unspecified: Secondary | ICD-10-CM | POA: Diagnosis present

## 2016-05-06 DIAGNOSIS — I712 Thoracic aortic aneurysm, without rupture: Secondary | ICD-10-CM | POA: Diagnosis present

## 2016-05-06 DIAGNOSIS — R531 Weakness: Secondary | ICD-10-CM | POA: Diagnosis not present

## 2016-05-06 DIAGNOSIS — D471 Chronic myeloproliferative disease: Secondary | ICD-10-CM | POA: Diagnosis not present

## 2016-05-06 DIAGNOSIS — N179 Acute kidney failure, unspecified: Secondary | ICD-10-CM | POA: Diagnosis not present

## 2016-05-06 DIAGNOSIS — I130 Hypertensive heart and chronic kidney disease with heart failure and stage 1 through stage 4 chronic kidney disease, or unspecified chronic kidney disease: Secondary | ICD-10-CM | POA: Diagnosis present

## 2016-05-06 DIAGNOSIS — I509 Heart failure, unspecified: Secondary | ICD-10-CM | POA: Diagnosis not present

## 2016-05-06 DIAGNOSIS — I471 Supraventricular tachycardia: Secondary | ICD-10-CM | POA: Diagnosis present

## 2016-05-06 DIAGNOSIS — N320 Bladder-neck obstruction: Secondary | ICD-10-CM | POA: Diagnosis present

## 2016-05-06 DIAGNOSIS — J189 Pneumonia, unspecified organism: Secondary | ICD-10-CM | POA: Diagnosis present

## 2016-05-06 DIAGNOSIS — M6281 Muscle weakness (generalized): Secondary | ICD-10-CM | POA: Diagnosis not present

## 2016-05-06 DIAGNOSIS — Z818 Family history of other mental and behavioral disorders: Secondary | ICD-10-CM | POA: Diagnosis not present

## 2016-05-06 DIAGNOSIS — G4733 Obstructive sleep apnea (adult) (pediatric): Secondary | ICD-10-CM | POA: Diagnosis present

## 2016-05-06 DIAGNOSIS — N183 Chronic kidney disease, stage 3 (moderate): Secondary | ICD-10-CM | POA: Diagnosis not present

## 2016-05-06 DIAGNOSIS — N401 Enlarged prostate with lower urinary tract symptoms: Secondary | ICD-10-CM | POA: Diagnosis not present

## 2016-05-06 DIAGNOSIS — M25531 Pain in right wrist: Secondary | ICD-10-CM | POA: Diagnosis present

## 2016-05-06 DIAGNOSIS — D508 Other iron deficiency anemias: Secondary | ICD-10-CM | POA: Diagnosis not present

## 2016-05-06 DIAGNOSIS — I714 Abdominal aortic aneurysm, without rupture: Secondary | ICD-10-CM | POA: Diagnosis present

## 2016-05-06 DIAGNOSIS — R262 Difficulty in walking, not elsewhere classified: Secondary | ICD-10-CM | POA: Diagnosis not present

## 2016-05-06 DIAGNOSIS — K649 Unspecified hemorrhoids: Secondary | ICD-10-CM | POA: Diagnosis present

## 2016-05-06 DIAGNOSIS — J309 Allergic rhinitis, unspecified: Secondary | ICD-10-CM | POA: Diagnosis present

## 2016-05-06 DIAGNOSIS — R161 Splenomegaly, not elsewhere classified: Secondary | ICD-10-CM | POA: Diagnosis present

## 2016-05-06 LAB — GLUCOSE, CAPILLARY
GLUCOSE-CAPILLARY: 102 mg/dL — AB (ref 65–99)
GLUCOSE-CAPILLARY: 121 mg/dL — AB (ref 65–99)
Glucose-Capillary: 119 mg/dL — ABNORMAL HIGH (ref 65–99)
Glucose-Capillary: 143 mg/dL — ABNORMAL HIGH (ref 65–99)

## 2016-05-06 LAB — MRSA PCR SCREENING: MRSA BY PCR: NEGATIVE

## 2016-05-06 MED ORDER — GLUCERNA SHAKE PO LIQD
237.0000 mL | Freq: Every day | ORAL | Status: DC
  Administered 2016-05-06 – 2016-05-07 (×2): 237 mL via ORAL

## 2016-05-06 MED ORDER — VITAMIN B-12 100 MCG PO TABS
100.0000 ug | ORAL_TABLET | Freq: Every day | ORAL | Status: DC
  Administered 2016-05-06 – 2016-05-12 (×7): 100 ug via ORAL
  Filled 2016-05-06 (×7): qty 1

## 2016-05-06 MED ORDER — GUAIFENESIN 100 MG/5ML PO SOLN
5.0000 mL | ORAL | Status: DC | PRN
  Administered 2016-05-06 – 2016-05-08 (×7): 100 mg via ORAL
  Filled 2016-05-06 (×7): qty 5

## 2016-05-06 MED ORDER — GABAPENTIN 100 MG PO CAPS
100.0000 mg | ORAL_CAPSULE | Freq: Three times a day (TID) | ORAL | Status: DC | PRN
  Administered 2016-05-06 (×2): 100 mg via ORAL
  Filled 2016-05-06 (×2): qty 1

## 2016-05-06 MED ORDER — SIMETHICONE 80 MG PO CHEW: 80.0000 mg | CHEWABLE_TABLET | Freq: Four times a day (QID) | ORAL | Status: DC | PRN

## 2016-05-06 MED ORDER — METHYLPREDNISOLONE SODIUM SUCC 125 MG IJ SOLR
80.0000 mg | Freq: Once | INTRAMUSCULAR | Status: AC
  Administered 2016-05-06: 80 mg via INTRAVENOUS
  Filled 2016-05-06: qty 2

## 2016-05-06 MED ORDER — FERROUS GLUCONATE 324 (38 FE) MG PO TABS
324.0000 mg | ORAL_TABLET | Freq: Every day | ORAL | Status: DC
  Administered 2016-05-06: 324 mg via ORAL
  Filled 2016-05-06: qty 1

## 2016-05-06 MED ORDER — CARVEDILOL 3.125 MG PO TABS
3.1250 mg | ORAL_TABLET | Freq: Two times a day (BID) | ORAL | Status: DC
  Administered 2016-05-06 – 2016-05-12 (×13): 3.125 mg via ORAL
  Filled 2016-05-06 (×14): qty 1

## 2016-05-06 MED ORDER — HYDROCODONE-ACETAMINOPHEN 5-325 MG PO TABS
1.0000 | ORAL_TABLET | Freq: Three times a day (TID) | ORAL | Status: DC
  Administered 2016-05-06 – 2016-05-12 (×19): 1 via ORAL
  Filled 2016-05-06 (×19): qty 1

## 2016-05-06 MED ORDER — GUAIFENESIN ER 600 MG PO TB12
1200.0000 mg | ORAL_TABLET | Freq: Two times a day (BID) | ORAL | Status: DC
  Administered 2016-05-06 – 2016-05-12 (×13): 1200 mg via ORAL
  Filled 2016-05-06 (×13): qty 2

## 2016-05-06 MED ORDER — FUROSEMIDE 10 MG/ML IJ SOLN
40.0000 mg | Freq: Two times a day (BID) | INTRAMUSCULAR | Status: DC
  Administered 2016-05-06 (×2): 40 mg via INTRAVENOUS
  Filled 2016-05-06 (×2): qty 4

## 2016-05-06 MED ORDER — ACETAMINOPHEN 500 MG PO TABS
500.0000 mg | ORAL_TABLET | Freq: Four times a day (QID) | ORAL | Status: DC | PRN
  Administered 2016-05-06: 500 mg via ORAL
  Filled 2016-05-06: qty 1

## 2016-05-06 MED ORDER — LISINOPRIL 2.5 MG PO TABS
2.5000 mg | ORAL_TABLET | Freq: Every day | ORAL | Status: DC
  Administered 2016-05-06 – 2016-05-12 (×7): 2.5 mg via ORAL
  Filled 2016-05-06 (×7): qty 1

## 2016-05-06 MED ORDER — BISACODYL 10 MG RE SUPP
10.0000 mg | Freq: Every day | RECTAL | Status: DC | PRN
Start: 2016-05-06 — End: 2016-05-12

## 2016-05-06 MED ORDER — ENOXAPARIN SODIUM 40 MG/0.4ML ~~LOC~~ SOLN
40.0000 mg | SUBCUTANEOUS | Status: DC
  Administered 2016-05-06 – 2016-05-12 (×6): 40 mg via SUBCUTANEOUS
  Filled 2016-05-06 (×7): qty 0.4

## 2016-05-06 MED ORDER — ALLOPURINOL 100 MG PO TABS
100.0000 mg | ORAL_TABLET | Freq: Every day | ORAL | Status: DC
  Administered 2016-05-06 – 2016-05-12 (×7): 100 mg via ORAL
  Filled 2016-05-06 (×7): qty 1

## 2016-05-06 MED ORDER — ASPIRIN 325 MG PO TABS
325.0000 mg | ORAL_TABLET | Freq: Every day | ORAL | Status: DC
  Administered 2016-05-06 – 2016-05-12 (×7): 325 mg via ORAL
  Filled 2016-05-06 (×7): qty 1

## 2016-05-06 MED ORDER — FLUTICASONE PROPIONATE 50 MCG/ACT NA SUSP
2.0000 | Freq: Every day | NASAL | Status: DC
  Administered 2016-05-06 – 2016-05-12 (×7): 2 via NASAL
  Filled 2016-05-06: qty 16

## 2016-05-06 MED ORDER — INSULIN ASPART 100 UNIT/ML ~~LOC~~ SOLN
0.0000 [IU] | Freq: Three times a day (TID) | SUBCUTANEOUS | Status: DC
  Administered 2016-05-06 – 2016-05-12 (×3): 2 [IU] via SUBCUTANEOUS

## 2016-05-06 NOTE — Progress Notes (Signed)
PROGRESS NOTE                                                                                                                                                                                                             Patient Demographics:    Juan [name], is a 75 y.o. male, DOB - 04-Nov-1940, XBD:[id number]  Admit date - 05/05/2016   Admitting Physician Reubin Milan, MD  Outpatient Primary MD for the patient is [name], MD  LOS - 0    Chief Complaint  Patient presents with  . Edema       Brief Narrative   75 y.o. male with medical history significant of allergic rhinitis, anemia, ascending aortic aneurysm, CAD, chronic diastolic CHF, edema, hemorrhoid, history of thrombocytosis, hypertension, migraine headaches, myeloproliferative disease, type 2 diabetes, obstructive sleep apnea not on CPAP who was recently discharged to a skilled nursing facility after being admitted for community-acquired pneumonia, congestive heart failure, urinary retention with acute on chronic renal insufficiency with chronic indwelling Foley catheter who is coming to the emergency department due to weakness and shortness of breat, only 2 hours after being discharged home from SNF.   Subjective:    Juan [name] today has, No headache, No chest pain, No abdominal pain - No Nausea, No generalized weakness, and bilateral wrist pain.  Assessment  & Plan :    Principal Problem:   Acute on chronic diastolic heart failure (HCC) Active Problems:   Essential hypertension   BPH (benign prostatic hyperplasia)   Weakness generalized   Thrombocytosis (HCC)   Anemia   Type II diabetes mellitus (HCC)   Acute on chronic Diastolic heart failure ([place]) - Most recent echo 12/24/2015, EF 55-60%, with a grade 1 diastolic dysfunction, BNP elevated at 185, chest x-ray with pulmonary edema. 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 They recommend continuing Foley catheter, change every 4 weeks and outpatient follow-up in 4 weeks.  Essential thrombocytosis/myeloproliferative syndrome - Followed  by Dr. Marin Olp as an outpatient  Type II diabetes mellitus ([place]) - Last hemoglobin A1c was 6.1% in 12/26/2015. - CBG monitoring with regular insulin sliding scale.   Hyperlipoidemia/acute gouty arthritis - Patient complains of pain in bilateral wrists, continue with allopurinol, will give one dose of Solu-Medrol.    Code Status : Full  Family Communication  : none at bedside  Disposition Plan  : pending PT  Consults  :  none  Procedures  :none  DVT Prophylaxis  :  Lovenox   Lab Results  Component Value Date   PLT 562 (H) 05/05/2016    Antibiotics  :    Anti-infectives    None        Objective:   Vitals:   05/06/16 0112 05/06/16 0546 05/06/16 0856 05/06/16 1157  BP: 139/75 137/75 140/71 128/74  Pulse: 99 95 98 89  Resp: '18 16  20  '$ Temp: 98.8 F (37.1 C) 98.6 F (37 C)  98.9 F (37.2 C)  TempSrc: Oral Oral  Oral  SpO2: 96% 97%  100%  Weight: 91.7 kg (202 lb 3.2 oz)     Height: '6\' 3"'$  (1.905 m)       Wt Readings from Last 3 Encounters:  05/06/16 91.7 kg (202 lb 3.2 oz)  05/04/16 87.1 kg (192 lb)  05/01/16 87.1 kg (192 lb)     Intake/Output Summary (Last 24 hours) at 05/06/16 1351 Last data filed at 05/06/16 1054  Gross per 24 hour  Intake              120 ml  Output              300 ml  Net             -180 ml     Physical Exam  Awake Alert, Oriented X 3,  Supple Neck,No JVD,  Symmetrical Chest  wall movement, Good air movement bilaterally, CTAB RRR,No Gallops,Rubs or new Murmurs, No Parasternal Heave +ve B.Sounds, Abd Soft, No tenderness, , No rebound - guarding or rigidity. No Cyanosis, Clubbing or edema, No new Rash or bruise , +1 edema.    Data Review:    CBC  Recent Labs Lab 05/05/16 2138  WBC 13.4*  HGB 8.0*  HCT 25.7*  PLT 562*  MCV 81.6  MCH 25.4*  MCHC 31.1  RDW 20.8*    Chemistries   Recent Labs Lab 05/05/16 2027  NA 140  K 3.7  CL 109  CO2 21*  GLUCOSE 115*  BUN 26*  CREATININE 0.90  CALCIUM 9.1  AST 24  ALT 16*  ALKPHOS 160*  BILITOT 0.2*   ------------------------------------------------------------------------------------------------------------------ No results for input(s): CHOL, HDL, LDLCALC, TRIG, CHOLHDL, LDLDIRECT in the last 72 hours.  Lab Results  Component Value Date   HGBA1C 6.1 12/26/2015   ------------------------------------------------------------------------------------------------------------------ No results for input(s): TSH, T4TOTAL, T3FREE, THYROIDAB in the last 72 hours.  Invalid input(s): FREET3 ------------------------------------------------------------------------------------------------------------------ No results for input(s): VITAMINB12, FOLATE, FERRITIN, TIBC, IRON, RETICCTPCT in the last 72 hours.  Coagulation profile No results for input(s): INR, PROTIME in the last 168 hours.  No results for input(s): DDIMER in the last 72 hours.  Cardiac Enzymes No results for input(s): CKMB, TROPONINI, MYOGLOBIN in the last 168 hours.  Invalid input(s): CK ------------------------------------------------------------------------------------------------------------------    Component Value Date/Time   BNP 182.5 (H) 05/05/2016 2027    Inpatient Medications  Scheduled Meds: . allopurinol  100 mg Oral Daily  . aspirin  325 mg Oral Daily  .  carvedilol  3.125 mg Oral BID WC  . enoxaparin (LOVENOX) injection   40 mg Subcutaneous Q24H  . feeding supplement (GLUCERNA SHAKE)  237 mL Oral Daily  . ferrous gluconate  324 mg Oral Q2200  . fluticasone  2 spray Each Nare Daily  . furosemide  40 mg Intravenous BID  . guaiFENesin  1,200 mg Oral BID  . HYDROcodone-acetaminophen  1 tablet Oral Q8H  . insulin aspart  0-15 Units Subcutaneous TID WC  . lisinopril  2.5 mg Oral Daily  . vitamin B-12  100 mcg Oral Daily   Continuous Infusions: PRN Meds:.acetaminophen, bisacodyl, gabapentin, guaiFENesin, simethicone  Micro Results Recent Results (from the past 240 hour(s))  MRSA PCR Screening     Status: None   Collection Time: 05/06/16  6:56 AM  Result Value Ref Range Status   MRSA by PCR NEGATIVE NEGATIVE Final    Comment:        The GeneXpert MRSA Assay (FDA approved for NASAL specimens only), is one component of a comprehensive MRSA colonization surveillance program. It is not intended to diagnose MRSA infection nor to guide or monitor treatment for MRSA infections.     Radiology Reports Ct Abdomen Pelvis Wo Contrast  Result Date: 04/15/2016 CLINICAL DATA:  Evaluate hydronephrosis. EXAM: CT ABDOMEN AND PELVIS WITHOUT CONTRAST TECHNIQUE: Multidetector CT imaging of the abdomen and pelvis was performed following the standard protocol without IV contrast. COMPARISON:  None. FINDINGS: Lower chest: There is subsegmental atelectasis within the lung bases. There is also a mosaic attenuation pattern within the visualized portions of the lungs. No pleural effusion noted. Hepatobiliary: No focal liver abnormality is seen. No gallstones, gallbladder wall thickening, or biliary dilatation.There is subsegmental atelectasis noted in the lung bases. Pancreas: Unremarkable. No pancreatic ductal dilatation or surrounding inflammatory changes. Spleen: The spleen is enlarged measuring 15.4 cm in length. Adrenals/Urinary Tract: Normal appearance of the adrenal glands. Cyst within the inferior pole the right kidney is  incompletely characterized without IV contrast measuring 3 cm. No renal calculi identified. Very mild bilateral pelvocaliectasis identified. No evidence for high-grade obstructive uropathy. The urinary bladder is collapsed around a Foley catheter. Stomach/Bowel: The stomach is normal. The small bowel loops have a normal course and caliber. Numerous colonic diverticula identified. No evidence for acute diverticulitis. Moderate stool burden is noted within the rectum. Vascular/Lymphatic: Aortic atherosclerosis. No adenopathy identified within the abdomen or the pelvis. Reproductive: Prostate is unremarkable. Other: No free fluid or fluid collections. Musculoskeletal: The bones are diffusely sclerotic compatible with widespread osseous metastatic disease. IMPRESSION: 1. Interval decompression of bilateral renal collecting systems with mild residual pelvocaliectasis status post catheter placement. At this time there is no evidence for high-grade obstructive uropathy of either kidney. 2. Splenomegaly. 3. Diffuse sclerotic bone metastases. 4. Aortic atherosclerosis. Electronically Signed   By: Kerby Moors M.D.   On: 04/15/2016 10:25   Dg Chest 2 View  Result Date: 05/05/2016 CLINICAL DATA:  Initial evaluation for acute weakness, tenderness, fevers, chills. EXAM: CHEST  2 VIEW COMPARISON:  Prior radiograph from 04/10/2016. FINDINGS: Cardiomegaly is stable from prior. Mediastinal silhouette within normal limits. Aortic atherosclerosis noted. Lungs are hypoinflated. Prominent perihilar vascular congestion. There are scattered multifocal airspace opacities involving the perihilar and bibasilar regions, left slightly greater than right. Findings may reflect edema or possibly multifocal infection. No pleural effusion. No pneumothorax. Linear lucency overlying the upper left chest felt to reflect a skin fold. 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 No acute osseous  abnormality.  IMPRESSION: 1. Scattered multifocal perihilar and bibasilar airspace opacities, which may reflect pulmonary edema or possibly multifocal pneumonia (edema is favored). 2. Stable cardiomegaly with aortic atherosclerosis. 3. 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 Electronically Signed   By: Jeannine Boga M.D.   On: 05/05/2016 21:25   Dg Chest 2 View  Result Date: 04/06/2016 CLINICAL DATA:  Shortness of breath. Sore back radiating to both flanks. Discharge [facility] on Saturday with diagnosis of pneumonia. Continued fever and chills. EXAM: CHEST  2 VIEW COMPARISON:  04/01/2016 FINDINGS: Shallow inspiration with linear atelectasis in the lung bases. Cardiac enlargement. Diffuse airspace opacities in the lungs consistent with edema or multifocal pneumonia. Similar appearance to previous study. No blunting of costophrenic angles. No pneumothorax. Calcified aorta. Thoracic scoliosis convex towards the right. IMPRESSION: Cardiac enlargement. Diffuse airspace disease suggesting edema or pneumonia. Electronically Signed   By: Lucienne Capers M.D.   On: 04/06/2016 23:55   Dg Wrist Complete Right  Result Date: 04/15/2016 CLINICAL DATA:  Acute right wrist pain at the radial aspect of the wrist as well as the proximal second and third metacarpals for 1 day. Right arm weakness. Initial encounter. EXAM: RIGHT WRIST - COMPLETE 3+ VIEW COMPARISON:  None. FINDINGS: There is no evidence of acute fracture or dislocation. No focal osseous lesion or osseous erosion is seen. Joint space widths are preserved. No focal soft tissue abnormality is identified. IMPRESSION: No evidence of acute osseous abnormality. Electronically Signed   By: Logan Bores M.D.   On: 04/15/2016 19:25   Nm Bone Scan Whole Body  Result Date: 04/17/2016 CLINICAL DATA:  Diffuse bone pain.  History of myelofibrosis. EXAM: NUCLEAR MEDICINE  WHOLE BODY BONE SCAN TECHNIQUE: Whole body anterior and posterior images were obtained approximately 3 hours after intravenous injection of radiopharmaceutical. RADIOPHARMACEUTICALS:  25.2 mCi Technetium-30mMDP IV COMPARISON:  Chest CT 04/01/2016.  CT abdomen and pelvis 04/14/2016. FINDINGS: There is diffusely increased radiotracer uptake throughout nearly the entirety of the axial and visualized appendicular skeleton with relative sparing of the skull. Symmetrically increased uptake is present about the hips, knees, ankles, and shoulders. The kidneys are only faintly visualized without evidence of significant radiotracer excretion into the collecting system or bladder, and this may reflect a combination of renal failure and the diffuse osseous tracer uptake. IMPRESSION: Diffusely abnormal uptake throughout the skeleton which is compatible with the patient's history of myelofibrosis. Diffuse osseous metastatic disease from a non-hematologic primary malignancy and metabolic bone diseases can also give this appearance. Electronically Signed   By: ALogan BoresM.D.   On: 04/17/2016 15:12   [place]al  Result Date: 04/13/2016 CLINICAL DATA:  Acute on chronic renal failure EXAM: RENAL / URINARY TRACT ULTRASOUND COMPLETE COMPARISON:  04/03/2014 FINDINGS: Right Kidney: Length: 11.1 cm. Increased echotexture. 2.8 cm lower pole cyst. Mild right hydronephrosis. Left Kidney: Length: 12.7 cm. Increased echotexture. There is severe left hydronephrosis. 1.4 cm echogenic, shadowing area in the lower pole, likely nonobstructing left renal stone. Bladder: Multiple large cystic areas within the pelvis which appear to be contiguous with the bladder compatible with diverticula and trabeculations, stable. IMPRESSION: Mild right hydronephrosis and severe left hydronephrosis. 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 Probable 14 mm nonobstructing left lower pole renal stone. Increased echotexture  in the kidneys compatible with chronic medical renal disease. Electronically Signed   By: KRolm BaptiseM.D.   On:  04/13/2016 13:01   Dg Chest Port 1 View  Result Date: 04/10/2016 CLINICAL DATA:  Acute respiratory failure with hypoxia. Pulmonary edema versus other infectious or leukemic infiltrates. EXAM: PORTABLE CHEST 1 VIEW COMPARISON:  Chest x-rays dated 04/06/2016, 04/01/2016, 12/29/2015 and 01/21/2015. FINDINGS: No significant change compared to the most recent chest x-ray of 04/06/2016, with persistent bilateral perihilar and bibasilar opacities compatible with either pulmonary edema or multifocal pneumonia. No new lung findings. No pleural effusions seen. Heart size and mediastinal contours are stable. Atherosclerotic changes again noted at the aortic arch. Osseous structures appear mottled, particularly about each shoulder, compatible with the mixed sclerotic and lucent lesions described on earlier chest CT of 04/01/2016. IMPRESSION: 1. Persistent perihilar and bibasilar airspace opacities, unchanged compared to the most recent chest x-ray of 04/06/2016, compatible with either pulmonary edema or multifocal pneumonia, favor pulmonary edema. No new lung findings. 2. Mottled appearance of the osseous structures, particularly prominent about each shoulder, compatible with the mixed sclerotic and lucent lesions described on chest CT of 04/01/2016, highly suspicious for metastatic disease. 3. Aortic atherosclerosis. Electronically Signed   By: Franki Cabot M.D.   On: 04/10/2016 19:06       Jamonta Goerner M.D on 05/06/2016 at 1:51 PM  Between 7am to 7pm - Pager - [phone]  After 7pm go to [email or web address] - password [facility]  [facility] -  Office  [phone]

## 2016-05-06 NOTE — Progress Notes (Signed)
Patient arrived to the unit with a leaking foley catheter. His linen and gown was changed. Checked the placement and bulb. Seemed to have the adequate amount of NS and to have proper placement.   Later during the shift the patient had soiled his bed linen and gown once again due to a leaking foley. Will continue to monitor as well as inform the oncoming RN so MD could consult urology because that's who placed the catheter according to the patient.

## 2016-05-06 NOTE — H&P (Signed)
History and Physical    Juan [name] WPY:[id number] DOB: June 28, 1940 DOA: 05/05/2016  PCP: [name], MD   Patient coming from: Home.  Chief Complaint: Weakness and shortness of breath.  HPI: Juan [name] is a 75 y.o. male with medical history significant of allergic rhinitis, anemia, ascending aortic aneurysm, CAD, chronic diastolic CHF, edema, hemorrhoid, history of thrombocytosis, hypertension, migraine headaches, myeloproliferative disease, type 2 diabetes, obstructive sleep apnea not on CPAP who was recently discharged to a skilled nursing facility after being admitted for community-acquired pneumonia, congestive heart failure, urinary retention with acute on chronic renal insufficiency with chronic indwelling Foley catheter who is coming to the emergency department due to weakness and shortness of breath.  Per patient, after he was discharged from the hospital to the rehabilitation facility yesterday he has continued to be weak, dsypneic with worsening edema in lower extremities and hands. He denies fever, chills, chest pain, palpitations, dizziness, diaphoresis, abdominal pain, diarrhea, melena or hematochezia.  ED Course: The patient received furosemide 40 mg IVP in the emergency department. He is BNP level was 782.5 pg/mL, troponin level 0.02 ng/mL, WBC of 13.4, hemoglobin level of 8.0 g/dL, platelets 562. BUN was 26, creatinine 0.90 and glucose 115 mg/dL. His chest radiograph favored a pattern of vascular congestion over recurring pneumonia.  Review of Systems: As per HPI otherwise 10 point review of systems negative.    Past Medical History:  Diagnosis Date  . Allergic rhinitis   . Anemia   . Ascending aortic aneurysm ([facility]) 03/2014   4.3cm on CT scan  . CAD (coronary artery disease)    dx elsewheer in past, no documentation. Non-ischemic myoview 2007  . Chronic diastolic CHF (congestive heart failure), NYHA class 2 (HCC)    Normal EF w/ grade 1 dd by echo 12/2015  . Edema    R>L  leg, u/s 5-12 neg for DVT  . Hemorrhoid   . History of thrombocytosis   . Hypertension   . Migraine    "once/wk at least" (07/11/2013)  . Myeloproliferative disease ([place])   . Shortness of breath   . Sinus congestion   . Sleep apnea, obstructive    at some point used CPAP, was d/c  years ago  . Type II diabetes mellitus ([place])     Past Surgical History:  Procedure Laterality Date  . TOE SURGERY Right    "tried to straighten out big toe" (07/11/2013)     reports that he quit smoking about 50 years ago. His smoking use included Cigarettes. He has a 3.00 pack-year smoking history. He has never used smokeless tobacco. He reports that he does not drink alcohol or use drugs.  No Known Allergies  Family History  Problem Relation Age of Onset  . Schizophrenia Son   . Colon cancer Neg Hx   . Prostate cancer Neg Hx   . Heart attack Neg Hx   . Diabetes Neg Hx     Prior to Admission medications   Medication Sig Start Date End Date Taking? Authorizing Provider  acetaminophen (TYLENOL) 325 MG tablet Take 325 mg by mouth every 6 (six) hours as needed for fever. For fever > or = 101    Historical Provider, MD  acetaminophen (TYLENOL) 500 MG tablet Take 1 tablet (500 mg total) by mouth every 6 (six) hours as needed for mild pain, moderate pain, fever or headache. Total acetaminophen dose from all sources not to exceed 4 g per day. 04/18/16   Modena Jansky, MD  allopurinol (  ZYLOPRIM) 100 MG tablet Take 1 tablet (100 mg total) by mouth daily. 04/19/16   Modena Jansky, MD  Amino Acids-Protein Hydrolys (FEEDING SUPPLEMENT, PRO-STAT SUGAR FREE 64,) LIQD Take 30 mLs by mouth 2 (two) times daily.    Historical Provider, MD  aspirin 325 MG tablet Take 325 mg by mouth daily.    Historical Provider, MD  bisacodyl (DULCOLAX) 10 MG suppository Place 1 suppository (10 mg total) rectally daily as needed for moderate constipation. 04/18/16   Modena Jansky, MD  carvedilol (COREG) 3.125 MG tablet Take 1  tablet (3.125 mg total) by mouth 2 (two) times daily. 04/18/16   Modena Jansky, MD  diclofenac sodium (VOLTAREN) 1 % GEL Apply 4 g topically 2 (two) times daily. Apply to bilateral  knees    Historical Provider, MD  feeding supplement, GLUCERNA SHAKE, (GLUCERNA SHAKE) LIQD Take 237 mLs by mouth daily. Reported on 08/21/2015    Historical Provider, MD  FERROUS GLUCONATE PO Take 240 mg by mouth daily at 10 pm. 1 tablet    Historical Provider, MD  fluticasone (FLONASE) 50 MCG/ACT nasal spray Place 2 sprays into both nostrils daily. Reported on 05/02/2015    Historical Provider, MD  furosemide (LASIX) 20 MG tablet Take 20 mg by mouth daily.    Historical Provider, MD  gabapentin (NEURONTIN) 100 MG capsule Take 100 mg by mouth 3 (three) times daily as needed (pain). Reported on 08/22/2015    Historical Provider, MD  guaiFENesin (MUCINEX) 600 MG 12 hr tablet Take 2 tablets (1,200 mg total) by mouth 2 (two) times daily. 04/04/16   Verlee Monte, MD  hydrocerin (EUCERIN) CREA Apply Eucerin cream to BLE Q day after bathing and roughly towel drying to remove loose skin 01/27/14   Jonetta Osgood, MD  HYDROcodone-acetaminophen (NORCO/VICODIN) 5-325 MG tablet Take 1 tablet by mouth every 8 (eight) hours. routinely    Historical Provider, MD  simethicone (MYLICON) 80 MG chewable tablet Chew 80 mg by mouth every 6 (six) hours as needed for flatulence.    Historical Provider, MD  sodium polystyrene (KAYEXALATE) 15 GM/60ML suspension Take 15 g by mouth daily.     Historical Provider, MD  vitamin B-12 (CYANOCOBALAMIN) 50 MCG tablet Take 100 mcg by mouth daily. 2 tablets    Historical Provider, MD    Physical Exam:  Constitutional: NAD, calm, comfortable Vitals:   05/05/16 2315 05/05/16 2330 05/05/16 2345 05/06/16 0000  BP: 135/81 143/84 130/82 132/96  Pulse: 104 106 103 107  Resp: '19 18 19 18  '$ Temp:      TempSrc:      SpO2: (!) 88% 91% 96% 96%  Weight:      Height:       Eyes: PERRL, lids and conjunctivae  normal ENMT: Mucous membranes are moist. Posterior pharynx clear of any exudate or lesions. Neck: normal, supple, no masses, no thyromegaly Respiratory: Bibasilar rales, no wheezing, no crackles. Normal respiratory effort. No accessory muscle use.  Cardiovascular: Regular rate and rhythm, no murmurs / rubs / gallops. 3+ lower extremity and bilateral hand edema. 2+ pedal pulses. No carotid bruits.  Abdomen: Soft, no tenderness, no masses palpated. No hepatosplenomegaly. Bowel sounds positive.  Musculoskeletal: No clubbing / cyanosis. Good ROM, no contractures. Normal muscle tone.  Skin: no significant rashes, lesions, ulcers on limited anterior exam. The patient declined to turn on his side so I could inspect his lower back for pressure ulcers. Neurologic: CN 2-12 grossly intact. Sensation intact, DTR normal. Generalized weakness.  Psychiatric: Normal judgment and insight. Alert and oriented x 3. Normal mood.    Labs on Admission: I have personally reviewed following labs and imaging studies  CBC:  Recent Labs Lab 05/05/16 2138  WBC 13.4*  HGB 8.0*  HCT 25.7*  MCV 81.6  PLT 660*   Basic Metabolic Panel:  Recent Labs Lab 05/05/16 2027  NA 140  K 3.7  CL 109  CO2 21*  GLUCOSE 115*  BUN 26*  CREATININE 0.90  CALCIUM 9.1   GFR: Estimated Creatinine Clearance: 86.1 mL/min (by C-G formula based on SCr of 0.9 mg/dL). Liver Function Tests:  Recent Labs Lab 05/05/16 2027  AST 24  ALT 16*  ALKPHOS 160*  BILITOT 0.2*  PROT 5.7*  ALBUMIN 2.5*   No results for input(s): LIPASE, AMYLASE in the last 168 hours. No results for input(s): AMMONIA in the last 168 hours. Coagulation Profile: No results for input(s): INR, PROTIME in the last 168 hours. Cardiac Enzymes: No results for input(s): CKTOTAL, CKMB, CKMBINDEX, TROPONINI in the last 168 hours. BNP (last 3 results) No results for input(s): PROBNP in the last 8760 hours. HbA1C: No results for input(s): HGBA1C in the last  72 hours. CBG: No results for input(s): GLUCAP in the last 168 hours. Lipid Profile: No results for input(s): CHOL, HDL, LDLCALC, TRIG, CHOLHDL, LDLDIRECT in the last 72 hours. Thyroid Function Tests: No results for input(s): TSH, T4TOTAL, FREET4, T3FREE, THYROIDAB in the last 72 hours. Anemia Panel: No results for input(s): VITAMINB12, FOLATE, FERRITIN, TIBC, IRON, RETICCTPCT in the last 72 hours. Urine analysis:    Component Value Date/Time   COLORURINE YELLOW 04/13/2016 1714   APPEARANCEUR CLOUDY (A) 04/13/2016 1714   LABSPEC 1.016 04/13/2016 1714   PHURINE 5.0 04/13/2016 1714   GLUCOSEU NEGATIVE 04/13/2016 1714   GLUCOSEU NEGATIVE 05/02/2015 1008   HGBUR NEGATIVE 04/13/2016 1714   BILIRUBINUR NEGATIVE 04/13/2016 1714   KETONESUR NEGATIVE 04/13/2016 1714   PROTEINUR 30 (A) 04/13/2016 1714   UROBILINOGEN 0.2 05/02/2015 1008   NITRITE NEGATIVE 04/13/2016 1714   LEUKOCYTESUR TRACE (A) 04/13/2016 1714    Radiological Exams on Admission: Dg Chest 2 View  Result Date: 05/05/2016 CLINICAL DATA:  Initial evaluation for acute weakness, tenderness, fevers, chills. EXAM: CHEST  2 VIEW COMPARISON:  Prior radiograph from 04/10/2016. FINDINGS: Cardiomegaly is stable from prior. Mediastinal silhouette within normal limits. Aortic atherosclerosis noted. Lungs are hypoinflated. Prominent perihilar vascular congestion. There are scattered multifocal airspace opacities involving the perihilar and bibasilar regions, left slightly greater than right. Findings may reflect edema or possibly multifocal infection. No pleural effusion. No pneumothorax. Linear lucency overlying the upper left chest felt to reflect a skin fold. 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 No acute osseous abnormality. IMPRESSION: 1. Scattered multifocal perihilar and bibasilar airspace opacities, which may reflect pulmonary edema or possibly multifocal pneumonia (edema is favored). 2.  Stable cardiomegaly with aortic atherosclerosis. 3. 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 Electronically Signed   By: Jeannine Boga M.D.   On: 05/05/2016 21:25   Echocardiogram 12/24/2015 ------------------------------------------------------------------- LV EF: 55% -   60%  ------------------------------------------------------------------- Indications:      CHF - 428.0.  ------------------------------------------------------------------- History:   PMH:  T2DM. Sleep apnea, obstructive. Sinus congestion. SOB. Myeloproliferative disease. Migraine. Hypertension. H/O thrombocytosis. Hemorrhoid. Edema. CAD. Ascending aortic aneurysm. Anemia. Allergic rhinitis.  ------------------------------------------------------------------- Study Conclusions  - Left ventricle: The cavity size was normal. Wall  thickness was   normal. Systolic function was normal. The estimated ejection   fraction was in the range of 55% to 60%. Wall motion was normal;   there were no regional wall motion abnormalities. Doppler   parameters are consistent with abnormal left ventricular   relaxation (grade 1 diastolic dysfunction). - Mitral valve: Calcified annulus. - Left atrium: The atrium was moderately dilated. - Right ventricle: The cavity size was mildly dilated. - Right atrium: The atrium was moderately dilated. - Atrial septum: There was an atrial septal aneurysm. - Pulmonary arteries: PA peak pressure: 33 mm Hg (S).  Impressions:  - Normal LV systolic function; grade 1 diastolic dysfunction;   moderate biatrial enlargement; mild RVE; mildly dilated aortic   root.   EKG: Independently reviewed. Vent. rate 98 BPM PR interval * ms QRS duration 99 ms QT/QTc 343/438 ms P-R-T axes 30 -16 52 Sinus rhythm Atrial premature complex Borderline left axis deviation Nonspecific T abnormalities, anterior  leads No significant change from previous EKG.  Assessment/Plan Principal Problem:   Acute on chronic diastolic heart failure (HCC) Admit to telemetry/observation. Continue supplemental oxygen. Continue furosemide 40 mg IVP twice a day. Supplemental potassium intake. Start low-dose ACE inhibitor. Monitor BUN, creatinine and electrolytes. Beta blocker on hold. Resume carvedilol once clinically better.  Active Problems:   Weakness generalized Physical therapy evaluation in a.m. Case manager evaluation in the morning.    Essential hypertension  on furosemide 40 mg IVP twice a day.. On lisinopril 2.5 mg by mouth daily. Carvedilol has been held due to acute decompensation    BPH (benign prostatic hyperplasia)  continue Foley catheter care.      Thrombocytosis (HCC) Monitor platelets.    Anemia Follow-up hematocrit and hemoglobin. Transfuse as needed.    Type II diabetes mellitus (HCC) Last hemoglobin A1c was 6.1% in 12/26/2015. Carbohydrate modified diet. CBG monitoring with regular insulin sliding scale.     DVT prophylaxis: Lovenox SQ. Code Status: Full code. Family Communication:  Disposition Plan: Admit for CHF exacerbation treatment and physical therapy evaluation. Consults called:  Admission status: Observation/telemetry.   Reubin Milan MD [facility] Pager [phone].  If 7PM-7AM, please contact night-coverage [email or web address] Password [email or web address]  05/06/2016, 12:22 AM

## 2016-05-06 NOTE — ED Notes (Signed)
Dr Ortiz at bedside 

## 2016-05-07 ENCOUNTER — Telehealth: Payer: Self-pay | Admitting: Internal Medicine

## 2016-05-07 ENCOUNTER — Encounter (HOSPITAL_COMMUNITY): Payer: Self-pay | Admitting: General Practice

## 2016-05-07 DIAGNOSIS — D471 Chronic myeloproliferative disease: Secondary | ICD-10-CM

## 2016-05-07 DIAGNOSIS — D473 Essential (hemorrhagic) thrombocythemia: Secondary | ICD-10-CM

## 2016-05-07 DIAGNOSIS — R531 Weakness: Secondary | ICD-10-CM

## 2016-05-07 DIAGNOSIS — Z794 Long term (current) use of insulin: Secondary | ICD-10-CM

## 2016-05-07 DIAGNOSIS — I509 Heart failure, unspecified: Secondary | ICD-10-CM

## 2016-05-07 DIAGNOSIS — E1142 Type 2 diabetes mellitus with diabetic polyneuropathy: Secondary | ICD-10-CM

## 2016-05-07 LAB — CBC
HEMATOCRIT: 28.9 % — AB (ref 39.0–52.0)
HEMOGLOBIN: 8.9 g/dL — AB (ref 13.0–17.0)
MCH: 25.3 pg — AB (ref 26.0–34.0)
MCHC: 30.8 g/dL (ref 30.0–36.0)
MCV: 82.1 fL (ref 78.0–100.0)
Platelets: 516 10*3/uL — ABNORMAL HIGH (ref 150–400)
RBC: 3.52 MIL/uL — ABNORMAL LOW (ref 4.22–5.81)
RDW: 21.1 % — ABNORMAL HIGH (ref 11.5–15.5)
WBC: 13.4 10*3/uL — ABNORMAL HIGH (ref 4.0–10.5)

## 2016-05-07 LAB — GLUCOSE, CAPILLARY
GLUCOSE-CAPILLARY: 129 mg/dL — AB (ref 65–99)
GLUCOSE-CAPILLARY: 125 mg/dL — AB (ref 65–99)
Glucose-Capillary: 140 mg/dL — ABNORMAL HIGH (ref 65–99)
Glucose-Capillary: 115 mg/dL — ABNORMAL HIGH (ref 65–99)

## 2016-05-07 LAB — BASIC METABOLIC PANEL
Anion gap: 7 (ref 5–15)
BUN: 28 mg/dL — ABNORMAL HIGH (ref 6–20)
CO2: 24 mmol/L (ref 22–32)
Calcium: 8.8 mg/dL — ABNORMAL LOW (ref 8.9–10.3)
Chloride: 107 mmol/L (ref 101–111)
Creatinine, Ser: 1 mg/dL (ref 0.61–1.24)
GFR calc Af Amer: >60 mL/min (ref >60)
Glucose, Bld: 137 mg/dL — ABNORMAL HIGH (ref 65–99)
POTASSIUM: 4.3 mmol/L (ref 3.5–5.1)
SODIUM: 138 mmol/L (ref 135–145)

## 2016-05-07 LAB — RETICULOCYTES
RBC.: 3.52 MIL/uL — AB (ref 4.22–5.81)
RETIC COUNT ABSOLUTE: 116.2 10*3/uL (ref 19.0–186.0)
Retic Ct Pct: 3.3 % — ABNORMAL HIGH (ref 0.4–3.1)

## 2016-05-07 LAB — FERRITIN: FERRITIN: 539 ng/mL — AB (ref 24–336)

## 2016-05-07 LAB — IRON AND TIBC
Iron: 20 ug/dL — ABNORMAL LOW (ref 45–182)
Saturation Ratios: 13 % — ABNORMAL LOW (ref 17.9–39.5)
TIBC: 158 ug/dL — ABNORMAL LOW (ref 250–450)
UIBC: 138 ug/dL

## 2016-05-07 LAB — TYPE AND SCREEN
ABO/RH(D): O POS
ANTIBODY SCREEN: NEGATIVE

## 2016-05-07 MED ORDER — FUROSEMIDE 10 MG/ML IJ SOLN
60.0000 mg | Freq: Two times a day (BID) | INTRAMUSCULAR | Status: DC
  Administered 2016-05-07 – 2016-05-10 (×7): 60 mg via INTRAVENOUS
  Filled 2016-05-07 (×7): qty 6

## 2016-05-07 MED ORDER — ADULT MULTIVITAMIN W/MINERALS CH
1.0000 | ORAL_TABLET | Freq: Every day | ORAL | Status: DC
  Administered 2016-05-08 – 2016-05-12 (×5): 1 via ORAL
  Filled 2016-05-07 (×5): qty 1

## 2016-05-07 MED ORDER — PRO-STAT SUGAR FREE PO LIQD
30.0000 mL | Freq: Two times a day (BID) | ORAL | Status: DC
  Administered 2016-05-07 – 2016-05-12 (×10): 30 mL via ORAL
  Filled 2016-05-07 (×10): qty 30

## 2016-05-07 MED ORDER — GLUCERNA SHAKE PO LIQD
237.0000 mL | Freq: Three times a day (TID) | ORAL | Status: DC
  Administered 2016-05-07 – 2016-05-12 (×14): 237 mL via ORAL

## 2016-05-07 NOTE — Progress Notes (Signed)
   05/07/16 1400  Clinical Encounter Type  Visited With Patient  Visit Type Follow-up  Referral From Other (Comment) (spiritual care consult for prayer)  Recommendations Consult Social Work  Spiritual Encounters  Spiritual Needs Prayer;Other (Comment) (blessing)  Stress Factors  Patient Stress Factors Other (Comment) (Pt's desires nursing home placement)   Pt. concerned he will die if sent home.  - Pt. wants placement into skilled nursing home.  - No family resources.   Will follow, as needed.   - Rev. [place] MDiv ThM

## 2016-05-07 NOTE — Evaluation (Signed)
Physical Therapy Evaluation Patient Details Name: Juan [name] MRN: [id number] DOB: May 16, 1941 Today's Date: 05/07/2016   History of Present Illness  Pt is a 75 y/o male with recent hospitalization on 11/20 secondary to CAP (four total admissions in the last six months). Pt d/c'd to SNF after last hospitalization and was home for one day but then returned to the hospital secondary to weakness and SOB. PMH including but not limited to CAD, CHF, HTN, DM and chronic indwelling catheter.   Clinical Impression  Pt presented supine in bed with HOB elevated, awake and willing to participate in therapy session. Prior to admission, pt reported that he has not ambulated in over three weeks and required assistance with ADLs. Pt currently requires mod to max A x2 for bed mobility. Pt refusing to attempt sit-to-stand transfer with therapist and tech during evaluation. Pt would continue to benefit from skilled physical therapy services at this time while admitted and after d/c to address his below listed limitations in order to improve his overall safety and independence with functional mobility.     Follow Up Recommendations SNF    Equipment Recommendations  None recommended by PT    Recommendations for Other Services       Precautions / Restrictions Precautions Precautions: Fall Restrictions Weight Bearing Restrictions: No      Mobility  Bed Mobility Overal bed mobility: Needs Assistance Bed Mobility: Rolling;Sidelying to Sit;Sit to Supine Rolling: Mod assist;+2 for physical assistance Sidelying to sit: Mod assist   Sit to supine: +2 for physical assistance;Max assist   General bed mobility comments: pt required mod A at trunk and bilateral LEs to achieve sitting EOB, and max A x2 to return to supine  Transfers                 General transfer comment: pt unwilling to attempt sit-to-stand transfer with therapist and tech during evaluation  Ambulation/Gait                 Stairs            Wheelchair Mobility    Modified Rankin (Stroke Patients Only)       Balance Overall balance assessment: Needs assistance Sitting-balance support: Feet supported;Bilateral upper extremity supported Sitting balance-Leahy Scale: Poor Sitting balance - Comments: pt required bilateral UE support and min guard for safety                                     Pertinent Vitals/Pain Pain Assessment: Faces Faces Pain Scale: Hurts little more Pain Location: back Pain Descriptors / Indicators: Sore Pain Intervention(s): Monitored during session;Repositioned    Home Living Family/patient expects to be discharged to:: Skilled nursing facility                      Prior Function Level of Independence: Needs assistance   Gait / Transfers Assistance Needed: pt reported that he has not ambulated in over three weeks  ADL's / Homemaking Assistance Needed: Pt's son performs housekeeping and cooking. Pt states he is able to bathe, dress and toilet himself at baseline.         Hand Dominance        Extremity/Trunk Assessment   Upper Extremity Assessment Upper Extremity Assessment: Generalized weakness    Lower Extremity Assessment Lower Extremity Assessment: Generalized weakness       Communication   Communication: No difficulties  Cognition Arousal/Alertness: Awake/alert Behavior During Therapy: Flat affect Overall Cognitive Status: Within Functional Limits for tasks assessed                      General Comments      Exercises General Exercises - Lower Extremity Ankle Circles/Pumps: AROM;Both;5 reps;Seated Long Arc Quad: AROM;Both;5 reps;Seated   Assessment/Plan    PT Assessment Patient needs continued PT services  PT Problem List Decreased strength;Decreased range of motion;Decreased activity tolerance;Decreased balance;Decreased mobility;Decreased coordination;Decreased knowledge of use of DME;Decreased safety  awareness;Pain          PT Treatment Interventions DME instruction;Gait training;Functional mobility training;Therapeutic activities;Therapeutic exercise;Balance training;Neuromuscular re-education;Patient/family education    PT Goals (Current goals can be found in the Care Plan section)  Acute Rehab PT Goals Patient Stated Goal: to get stronger and walk again PT Goal Formulation: With patient Time For Goal Achievement: 05/21/16 Potential to Achieve Goals: Fair    Frequency Min 2X/week   Barriers to discharge        Co-evaluation               End of Session Equipment Utilized During Treatment: Oxygen (2L of O2) Activity Tolerance: Patient limited by fatigue Patient left: in bed;with call [name] within reach;with bed alarm set;with family/visitor present Nurse Communication: Mobility status;Need for lift equipment         Time: 1133-1205 PT Time Calculation (min) (ACUTE ONLY): 32 min   Charges:   PT Evaluation $PT Eval Moderate Complexity: 1 Procedure PT Treatments $Therapeutic Activity: 8-22 mins   PT G CodesClearnce Sorrel Dorrine Montone 05/07/2016, 1:29 PM Sherie Don, PT, DPT [phone]

## 2016-05-07 NOTE — Progress Notes (Signed)
Initial Nutrition Assessment  INTERVENTION:  Provide Glucerna Shake po TID, each supplement provides 220 kcal and 10 grams of protein Provide 30 ml Pro-Stat BID, each dose provides 100 kcal and 15 grams of protein   NUTRITION DIAGNOSIS:   Increased nutrient needs related to wound healing as evidenced by estimated needs.   GOAL:   Patient will meet greater than or equal to 90% of their needs   MONITOR:   PO intake, Supplement acceptance, Weight trends, Labs, Skin, I & O's  REASON FOR ASSESSMENT:   Malnutrition Screening Tool    ASSESSMENT:   75 y.o. male with medical history significant of allergic rhinitis, anemia, ascending aortic aneurysm, CAD, chronic diastolic CHF, edema, hemorrhoid, history of thrombocytosis, hypertension, migraine headaches, myeloproliferative disease, type 2 diabetes, obstructive sleep apnea not on CPAP who was recently discharged to a skilled nursing facility after being admitted for community-acquired pneumonia, congestive heart failure, urinary retention with acute on chronic renal insufficiency with chronic indwelling Foley catheter who is coming to the emergency department due to weakness and shortness of breat, only 2 hours after being discharged home from SNF.  Pt states that he has had a poor appetite for the past 6 weeks due to being sick and he reports eating 50% of most meals. Pt currently with edema in arms and legs which may be masking weight loss. He has moderate muscle wasting of temples and hands. Pt is concerns about his blood sugars and wondering why he received sweet tea. He would like a Carb Modified diet. He likes Glucerna Shakes and drank one this afternoon.   Labs: low iron, low TIBC, elevated ferritin, low hemoglobin  Diet Order:  Diet Heart Room service appropriate? Yes; Fluid consistency: Thin  Skin:  Wound (see comment) (stage II pressure ulcer on sacrum)  Last BM:  12/20  Height:   Ht Readings from Last 1 Encounters:   05/06/16 '6\' 3"'$  (1.905 m)    Weight:   Wt Readings from Last 1 Encounters:  05/07/16 203 lb (92.1 kg)    Ideal Body Weight:  89.09 kg  BMI:  Body mass index is 25.37 kg/m.  Estimated Nutritional Needs:   Kcal:  2200-2400  Protein:  110-120 grams  Fluid:  Per MD  EDUCATION NEEDS:   No education needs identified at this time  Scarlette Ar RD, CSP, LDN Inpatient Clinical Dietitian Pager: [phone] After Hours Pager: [phone]

## 2016-05-07 NOTE — Telephone Encounter (Signed)
FYI: Home Health called to inform provider that they have tried to contact the patient several times and can not get in touch with him to schedule a visit.

## 2016-05-07 NOTE — Progress Notes (Signed)
Informed Dr.  [facility] that urine is leaking around patients foley. MD states to bladder scan patient and try to flush with 20cc Ns.

## 2016-05-07 NOTE — Progress Notes (Signed)
PROGRESS NOTE                                                                                                                                                                                                             Patient Demographics:    Juan [name], is a 75 y.o. male, DOB - 1941-04-05, WGY:[id number]  Admit date - 05/05/2016   Admitting Physician Reubin Milan, MD  Outpatient Primary MD for the patient is [name], MD  LOS - 1    Chief Complaint  Patient presents with  . Edema       Brief Narrative   75 y.o. male with medical history significant of allergic rhinitis, anemia, ascending aortic aneurysm, CAD, chronic diastolic CHF, edema, hemorrhoid, history of thrombocytosis, hypertension, migraine headaches, myeloproliferative disease, type 2 diabetes, obstructive sleep apnea not on CPAP who was recently discharged to a skilled nursing facility after being admitted for community-acquired pneumonia, congestive heart failure, urinary retention with acute on chronic renal insufficiency with chronic indwelling Foley catheter who is coming to the emergency department due to weakness and shortness of breat, only 2 hours after being discharged home from SNF.   Subjective:    Juan [name] today has, No headache, No chest pain, No abdominal pain - No Nausea, No generalized weakness, and bilateral wrist pain.  Assessment  & Plan :    Principal Problem:   Acute on chronic diastolic heart failure (HCC) Active Problems:   Essential hypertension   BPH (benign prostatic hyperplasia)   Weakness generalized   Thrombocytosis (HCC)   Anemia   Type II diabetes mellitus (HCC)   Acute on chronic Diastolic heart failure ([place]) - Most recent echo 12/24/2015, EF 55-60%, with a grade 1 diastolic dysfunction, BNP elevated at 185, chest x-ray with pulmonary edema. 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 They recommend continuing Foley catheter, change every 4 weeks and outpatient  follow-up in 4 weeks.  Essential thrombocytosis/myeloproliferative syndrome - Dr. Marin Olp follow-up greatly appreciated, many transfusions admission  Type II diabetes mellitus ([place]) - Last hemoglobin A1c was 6.1% in 12/26/2015. - CBG monitoring with regular insulin sliding scale.   acute gouty arthritis - Patient complains of pain in bilateral wrists, continue with allopurinol, currently improved after receiving 1 dose of IV Solu-Medrol  Code Status : Full  Family Communication  : none at bedside  Disposition Plan  : pending PT, likely would need SNF placement  Consults  :  none  Procedures  :none  DVT Prophylaxis  :  Lovenox   Lab Results  Component Value Date   PLT 516 (H) 05/07/2016    Antibiotics  :    Anti-infectives    None        Objective:   Vitals:   05/06/16 2006 05/07/16 0006 05/07/16 0526 05/07/16 0741  BP: 126/74 129/76 126/68 121/79  Pulse: 79 83 61 73  Resp: '18 16 18 18  '$ Temp: 98.1 F (36.7 C) 98.5 F (36.9 C) 98.4 F (36.9 C) 98.2 F (36.8 C)  TempSrc: Oral Oral Oral Oral  SpO2: 99% 100% 98% 98%  Weight:   92.1 kg (203 lb)   Height:        Wt Readings from Last 3 Encounters:  05/07/16 92.1 kg (203 lb)  05/04/16 87.1 kg (192 lb)  05/01/16 87.1 kg (192 lb)     Intake/Output Summary (Last 24 hours) at 05/07/16 1324 Last data filed at 05/07/16 1148  Gross per 24 hour  Intake              720 ml  Output              700 ml  Net               20  ml     Physical Exam  Awake Alert, Oriented X 3,  Supple Neck,No JVD,  Symmetrical Chest wall movement, Good air movement bilaterally, CTAB RRR,No Gallops,Rubs or new Murmurs, No Parasternal Heave +ve B.Sounds, Abd Soft, No tenderness, , No rebound - guarding or rigidity. No Cyanosis, Clubbing or edema, No new Rash or bruise , +1 edema.    Data Review:    CBC  Recent Labs Lab 05/05/16 2138 05/07/16 1010  WBC 13.4* 13.4*  HGB 8.0* 8.9*  HCT 25.7* 28.9*  PLT 562* 516*  MCV 81.6 82.1  MCH 25.4* 25.3*  MCHC 31.1 30.8  RDW 20.8* 21.1*    Chemistries   Recent Labs Lab 05/05/16 2027 05/07/16 0345  NA 140 138  K 3.7 4.3  CL 109 107  CO2 21* 24  GLUCOSE 115* 137*  BUN 26* 28*  CREATININE 0.90 1.00  CALCIUM 9.1 8.8*  AST 24  --   ALT 16*  --   ALKPHOS 160*  --   BILITOT 0.2*  --    ------------------------------------------------------------------------------------------------------------------ No results for input(s): CHOL, HDL, LDLCALC, TRIG, CHOLHDL, LDLDIRECT in the last 72 hours.  Lab Results  Component Value Date   HGBA1C 6.1 12/26/2015   ------------------------------------------------------------------------------------------------------------------ No results for input(s): TSH, T4TOTAL, T3FREE, THYROIDAB in the last 72 hours.  Invalid input(s): FREET3 ------------------------------------------------------------------------------------------------------------------  Recent Labs  05/07/16 1010  FERRITIN 539*  TIBC 158*  IRON 20*  RETICCTPCT 3.3*    Coagulation profile No results for input(s): INR, PROTIME in the last 168 hours.  No results for input(s): DDIMER in the last 72 hours.  Cardiac Enzymes No results  for input(s): CKMB, TROPONINI, MYOGLOBIN in the last 168 hours.  Invalid input(s): CK ------------------------------------------------------------------------------------------------------------------    Component Value Date/Time     BNP 182.5 (H) 05/05/2016 2027    Inpatient Medications  Scheduled Meds: . allopurinol  100 mg Oral Daily  . aspirin  325 mg Oral Daily  . carvedilol  3.125 mg Oral BID WC  . enoxaparin (LOVENOX) injection  40 mg Subcutaneous Q24H  . feeding supplement (GLUCERNA SHAKE)  237 mL Oral Daily  . fluticasone  2 spray Each Nare Daily  . furosemide  60 mg Intravenous BID  . guaiFENesin  1,200 mg Oral BID  . HYDROcodone-acetaminophen  1 tablet Oral Q8H  . insulin aspart  0-15 Units Subcutaneous TID WC  . lisinopril  2.5 mg Oral Daily  . vitamin B-12  100 mcg Oral Daily   Continuous Infusions: PRN Meds:.acetaminophen, bisacodyl, gabapentin, guaiFENesin, simethicone  Micro Results Recent Results (from the past 240 hour(s))  MRSA PCR Screening     Status: None   Collection Time: 05/06/16  6:56 AM  Result Value Ref Range Status   MRSA by PCR NEGATIVE NEGATIVE Final    Comment:        The GeneXpert MRSA Assay (FDA approved for NASAL specimens only), is one component of a comprehensive MRSA colonization surveillance program. It is not intended to diagnose MRSA infection nor to guide or monitor treatment for MRSA infections.     Radiology Reports Ct Abdomen Pelvis Wo Contrast  Result Date: 04/15/2016 CLINICAL DATA:  Evaluate hydronephrosis. EXAM: CT ABDOMEN AND PELVIS WITHOUT CONTRAST TECHNIQUE: Multidetector CT imaging of the abdomen and pelvis was performed following the standard protocol without IV contrast. COMPARISON:  None. FINDINGS: Lower chest: There is subsegmental atelectasis within the lung bases. There is also a mosaic attenuation pattern within the visualized portions of the lungs. No pleural effusion noted. Hepatobiliary: No focal liver abnormality is seen. No gallstones, gallbladder wall thickening, or biliary dilatation.There is subsegmental atelectasis noted in the lung bases. Pancreas: Unremarkable. No pancreatic ductal dilatation or surrounding inflammatory changes.  Spleen: The spleen is enlarged measuring 15.4 cm in length. Adrenals/Urinary Tract: Normal appearance of the adrenal glands. Cyst within the inferior pole the right kidney is incompletely characterized without IV contrast measuring 3 cm. No renal calculi identified. Very mild bilateral pelvocaliectasis identified. No evidence for high-grade obstructive uropathy. The urinary bladder is collapsed around a Foley catheter. Stomach/Bowel: The stomach is normal. The small bowel loops have a normal course and caliber. Numerous colonic diverticula identified. No evidence for acute diverticulitis. Moderate stool burden is noted within the rectum. Vascular/Lymphatic: Aortic atherosclerosis. No adenopathy identified within the abdomen or the pelvis. Reproductive: Prostate is unremarkable. Other: No free fluid or fluid collections. Musculoskeletal: The bones are diffusely sclerotic compatible with widespread osseous metastatic disease. IMPRESSION: 1. Interval decompression of bilateral renal collecting systems with mild residual pelvocaliectasis status post catheter placement. At this time there is no evidence for high-grade obstructive uropathy of either kidney. 2. Splenomegaly. 3. Diffuse sclerotic bone metastases. 4. Aortic atherosclerosis. Electronically Signed   By: Kerby Moors M.D.   On: 04/15/2016 10:25   Dg Chest 2 View  Result Date: 05/05/2016 CLINICAL DATA:  Initial evaluation for acute weakness, tenderness, fevers, chills. EXAM: CHEST  2 VIEW COMPARISON:  Prior radiograph from 04/10/2016. FINDINGS: Cardiomegaly is stable from prior. Mediastinal silhouette within normal limits. Aortic atherosclerosis noted. Lungs are hypoinflated. Prominent perihilar vascular congestion. There are scattered multifocal airspace opacities involving the perihilar and bibasilar regions,  left slightly greater than right. Findings may reflect edema or possibly multifocal infection. No pleural effusion. No pneumothorax. Linear  lucency overlying the upper left chest felt to reflect a skin fold. 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 No acute osseous abnormality. IMPRESSION: 1. Scattered multifocal perihilar and bibasilar airspace opacities, which may reflect pulmonary edema or possibly multifocal pneumonia (edema is favored). 2. Stable cardiomegaly with aortic atherosclerosis. 3. 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 Electronically Signed   By: Jeannine Boga M.D.   On: 05/05/2016 21:25   Dg Wrist Complete Right  Result Date: 04/15/2016 CLINICAL DATA:  Acute right wrist pain at the radial aspect of the wrist as well as the proximal second and third metacarpals for 1 day. Right arm weakness. Initial encounter. EXAM: RIGHT WRIST - COMPLETE 3+ VIEW COMPARISON:  None. FINDINGS: There is no evidence of acute fracture or dislocation. No focal osseous lesion or osseous erosion is seen. Joint space widths are preserved. No focal soft tissue abnormality is identified. IMPRESSION: No evidence of acute osseous abnormality. Electronically Signed   By: Logan Bores M.D.   On: 04/15/2016 19:25   Nm Bone Scan Whole Body  Result Date: 04/17/2016 CLINICAL DATA:  Diffuse bone pain.  History of myelofibrosis. EXAM: NUCLEAR MEDICINE WHOLE BODY BONE SCAN TECHNIQUE: Whole body anterior and posterior images were obtained approximately 3 hours after intravenous injection of radiopharmaceutical. RADIOPHARMACEUTICALS:  25.2 mCi Technetium-61mMDP IV COMPARISON:  Chest CT 04/01/2016.  CT abdomen and pelvis 04/14/2016. FINDINGS: There is diffusely increased radiotracer uptake throughout nearly the entirety of the axial and visualized appendicular skeleton with relative sparing of the skull. Symmetrically increased uptake is present about the hips, knees, ankles, and shoulders. The kidneys are only faintly visualized  without evidence of significant radiotracer excretion into the collecting system or bladder, and this may reflect a combination of renal failure and the diffuse osseous tracer uptake. IMPRESSION: Diffusely abnormal uptake throughout the skeleton which is compatible with the patient's history of myelofibrosis. Diffuse osseous metastatic disease from a non-hematologic primary malignancy and metabolic bone diseases can also give this appearance. Electronically Signed   By: ALogan BoresM.D.   On: 04/17/2016 15:12   UKoreaRenal  Result Date: 04/13/2016 CLINICAL DATA:  Acute on chronic renal failure EXAM: RENAL / URINARY TRACT ULTRASOUND COMPLETE COMPARISON:  04/03/2014 FINDINGS: Right Kidney: Length: 11.1 cm. Increased echotexture. 2.8 cm lower pole cyst. Mild right hydronephrosis. Left Kidney: Length: 12.7 cm. Increased echotexture. There is severe left hydronephrosis. 1.4 cm echogenic, shadowing area in the lower pole, likely nonobstructing left renal stone. Bladder: Multiple large cystic areas within the pelvis which appear to be contiguous with the bladder compatible with diverticula and trabeculations, stable. IMPRESSION: Mild right hydronephrosis and severe left hydronephrosis. 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 Probable 14 mm nonobstructing left lower pole renal stone. Increased echotexture in the kidneys compatible with chronic medical renal disease. Electronically Signed   By: KRolm BaptiseM.D.   On: 04/13/2016 13:01   Dg Chest Port 1 View  Result Date: 04/10/2016 CLINICAL DATA:  Acute respiratory failure with hypoxia. Pulmonary edema versus other infectious or leukemic infiltrates. EXAM: PORTABLE CHEST 1 VIEW COMPARISON:  Chest x-rays dated 04/06/2016, 04/01/2016, 12/29/2015 and 01/21/2015. FINDINGS: No significant change compared to the most recent chest x-ray of 04/06/2016, with persistent bilateral perihilar and bibasilar opacities compatible with either  pulmonary  edema or multifocal pneumonia. No new lung findings. No pleural effusions seen. Heart size and mediastinal contours are stable. Atherosclerotic changes again noted at the aortic arch. Osseous structures appear mottled, particularly about each shoulder, compatible with the mixed sclerotic and lucent lesions described on earlier chest CT of 04/01/2016. IMPRESSION: 1. Persistent perihilar and bibasilar airspace opacities, unchanged compared to the most recent chest x-ray of 04/06/2016, compatible with either pulmonary edema or multifocal pneumonia, favor pulmonary edema. No new lung findings. 2. Mottled appearance of the osseous structures, particularly prominent about each shoulder, compatible with the mixed sclerotic and lucent lesions described on chest CT of 04/01/2016, highly suspicious for metastatic disease. 3. Aortic atherosclerosis. Electronically Signed   By: Franki Cabot M.D.   On: 04/10/2016 19:06       Abdulmalik Darco M.D on 05/07/2016 at 1:24 PM  Between 7am to 7pm - Pager - [phone]  After 7pm go to [email or web address] - password [facility]  [facility] -  Office  [phone]

## 2016-05-07 NOTE — Telephone Encounter (Signed)
[facility] visited patient at home and no answer wanted to make PCP aware

## 2016-05-07 NOTE — Clinical Social Work Note (Signed)
Per PT eval, patient has not ambulated in over 3 weeks. CSW contacted admissions coordinator at [facility] who stated that patient was refusing PT at [facility]. Patient was refusing some aspects of PT evaluation today as well. Patient has used up his 20 days at [facility] that insurance would pay 100% for. He is now in his copay days.   Juan [name], [place]

## 2016-05-07 NOTE — Telephone Encounter (Signed)
Noted  

## 2016-05-07 NOTE — Consult Note (Signed)
Referral MD  Reason for Referral: Myeloproliferative disorder   Chief Complaint  Patient presents with  . Edema  : I'm back in the hospital and my legs are swollen.  HPI: Juan [name] is well-known to me. I do not see him for a urinary half. He has a history of a myeloproliferative disorder. Last one saw him was back in June 2016. He has been seen by several doctors. He just has not wanted any kind of workup.  He is now admitted over at [facility] he was admitted with heart failure. He has been seen by cardiology. It's hard to say how reliable he is on taking his medications.  He was seen by cardiology. His last echocardiogram was done 4 months ago. He had an ejection fraction of 55-60%. He had a CT scan of the abdomen on November 28. There was splenomegaly. He has sclerotic bone lesions reflective of his blood disorder. He did have a chest x-ray done. This showed some cardiomegaly. He has some scattered perihilar and bibasilar opacities.  His lab work 2 days ago showed a white count of 13.4. Hemoglobin 8. Platelet count 562,000. His electrolytes showed a creatinine of 0.9. Ibuprofen was 2.5. Calcium 9.1.  Again, he does does not have much insight into his blood problem. I talked him about this. He obviously has a strong faith.  Mosher when he was last transfused. The last type and cross that I see on him was back in September 2016.  His appetite is okay. He's had no nausea or vomiting.  He does look a little bit on the pale side.  He's had no obvious bleeding.  He says he came in because his legs got very swollen.  I would say his overall performance status is ECOG 2-3.    Past Medical History:  Diagnosis Date  . Allergic rhinitis   . Anemia   . Ascending aortic aneurysm ([place]) 03/2014   4.3cm on CT scan  . CAD (coronary artery disease)    dx elsewheer in past, no documentation. Non-ischemic myoview 2007  . Chronic diastolic CHF (congestive heart failure), NYHA class 2 (HCC)     Normal EF w/ grade 1 dd by echo 12/2015  . Edema    R>L leg, u/s 5-12 neg for DVT  . Hemorrhoid   . History of thrombocytosis   . Hypertension   . Migraine    "once/wk at least" (07/11/2013)  . Myeloproliferative disease ([place])   . Shortness of breath   . Sinus congestion   . Sleep apnea, obstructive    at some point used CPAP, was d/c  years ago  . Type II diabetes mellitus ([place])   :  Past Surgical History:  Procedure Laterality Date  . TOE SURGERY Right    "tried to straighten out big toe" (07/11/2013)  :   Current Facility-Administered Medications:  .  acetaminophen (TYLENOL) tablet 500 mg, 500 mg, Oral, Q6H PRN, Reubin Milan, MD, 500 mg at 05/06/16 7564 .  allopurinol (ZYLOPRIM) tablet 100 mg, 100 mg, Oral, Daily, Reubin Milan, MD, 100 mg at 05/06/16 3329 .  aspirin tablet 325 mg, 325 mg, Oral, Daily, Reubin Milan, MD, 325 mg at 05/06/16 5188 .  bisacodyl (DULCOLAX) suppository 10 mg, 10 mg, Rectal, Daily PRN, Reubin Milan, MD .  carvedilol (COREG) tablet 3.125 mg, 3.125 mg, Oral, BID WC, Reubin Milan, MD, 3.125 mg at 05/07/16 4166 .  enoxaparin (LOVENOX) injection 40 mg, 40 mg, Subcutaneous, Q24H,  Reubin Milan, MD, 40 mg at 05/06/16 1512 .  feeding supplement (GLUCERNA SHAKE) (GLUCERNA SHAKE) liquid 237 mL, 237 mL, Oral, Daily, Reubin Milan, MD, 237 mL at 05/06/16 1000 .  ferrous gluconate (FERGON) tablet 324 mg, 324 mg, Oral, Q2200, Reubin Milan, MD, 324 mg at 05/06/16 2105 .  fluticasone (FLONASE) 50 MCG/ACT nasal spray 2 spray, 2 spray, Each Nare, Daily, Reubin Milan, MD, 2 spray at 05/06/16 0906 .  furosemide (LASIX) injection 40 mg, 40 mg, Intravenous, BID, Reubin Milan, MD, 40 mg at 05/06/16 1809 .  gabapentin (NEURONTIN) capsule 100 mg, 100 mg, Oral, TID PRN, Reubin Milan, MD, 100 mg at 05/06/16 2105 .  guaiFENesin (MUCINEX) 12 hr tablet 1,200 mg, 1,200 mg, Oral, BID, Reubin Milan, MD, 1,200 mg at  05/06/16 2105 .  guaiFENesin (ROBITUSSIN) 100 MG/5ML solution 100 mg, 5 mL, Oral, Q4H PRN, Reubin Milan, MD, 100 mg at 05/07/16 0250 .  HYDROcodone-acetaminophen (NORCO/VICODIN) 5-325 MG per tablet 1 tablet, 1 tablet, Oral, Q8H, Reubin Milan, MD, 1 tablet at 05/07/16 [id number] .  insulin aspart (novoLOG) injection 0-15 Units, 0-15 Units, Subcutaneous, TID WC, Reubin Milan, MD, 2 Units at 05/06/16 1514 .  lisinopril (PRINIVIL,ZESTRIL) tablet 2.5 mg, 2.5 mg, Oral, Daily, Reubin Milan, MD, 2.5 mg at 05/06/16 7169 .  simethicone (MYLICON) chewable tablet 80 mg, 80 mg, Oral, Q6H PRN, Reubin Milan, MD .  vitamin B-12 (CYANOCOBALAMIN) tablet 100 mcg, 100 mcg, Oral, Daily, Reubin Milan, MD, 100 mcg at 05/06/16 6789:  . allopurinol  100 mg Oral Daily  . aspirin  325 mg Oral Daily  . carvedilol  3.125 mg Oral BID WC  . enoxaparin (LOVENOX) injection  40 mg Subcutaneous Q24H  . feeding supplement (GLUCERNA SHAKE)  237 mL Oral Daily  . ferrous gluconate  324 mg Oral Q2200  . fluticasone  2 spray Each Nare Daily  . furosemide  40 mg Intravenous BID  . guaiFENesin  1,200 mg Oral BID  . HYDROcodone-acetaminophen  1 tablet Oral Q8H  . insulin aspart  0-15 Units Subcutaneous TID WC  . lisinopril  2.5 mg Oral Daily  . vitamin B-12  100 mcg Oral Daily  :  No Known Allergies:  Family History  Problem Relation Age of Onset  . Schizophrenia Son   . Colon cancer Neg Hx   . Prostate cancer Neg Hx   . Heart attack Neg Hx   . Diabetes Neg Hx   :  Social History   Social History  . Marital status: Widowed    Spouse name: N/A  . Number of children: 2  . Years of education: N/A   Occupational History  . retired    .  Retired   Social History Main Topics  . Smoking status: Former Smoker    Packs/day: 0.25    Years: 12.00    Types: Cigarettes    Quit date: 05/18/1966  . Smokeless tobacco: Never Used     Comment: quit smoking 45 years ago  . Alcohol use No  . Drug  use: No  . Sexual activity: Yes   Other Topics Concern  . Not on file   Social History Narrative   Moved from [place] 2006   Widow 2007   Son lives with him   :  Pertinent items are noted in HPI.  Exam: Patient Vitals for the past 24 hrs:  BP Temp Temp src Pulse Resp SpO2 Weight  05/07/16 0526 126/68 98.4 F (36.9 C) Oral 61 18 98 % 203 lb (92.1 kg)  05/07/16 0006 129/76 98.5 F (36.9 C) Oral 83 16 100 % -  05/06/16 2006 126/74 98.1 F (36.7 C) Oral 79 18 99 % -  05/06/16 1157 128/74 98.9 F (37.2 C) Oral 89 20 100 % -  05/06/16 0856 140/71 - - 98 - - -    As above    Recent Labs  05/05/16 2138  WBC 13.4*  HGB 8.0*  HCT 25.7*  PLT 562*    Recent Labs  05/05/16 2027 05/07/16 0345  NA 140 138  K 3.7 4.3  CL 109 107  CO2 21* 24  GLUCOSE 115* 137*  BUN 26* 28*  CREATININE 0.90 1.00  CALCIUM 9.1 8.8*    Blood smear review:  None made  Pathology: None     Assessment and Plan:  Juan [name] is a 76 year old African-American male. He has a long-standing myeloproliferative disorder. He does not want any therapy for this. We have tried to get him to do a bone marrow test. He is refused.  Our only option is supportive care for him. He came in with a very low hemoglobin. I think that he may need to be transfused.  I will see what his iron studies look like. He is on oral iron at home. I doubt this is being absorbed. I don't know if he would even be taking this.  He is on full dose aspirin at home. Again, I don't know what he is taking medicine wise.  He is very pleasant. He just does not wish to talk about his blood problem. Again he will not allow any invasive test to be done for his blood issue.  We'll see what his iron studies look like. He may need to be transfused. I would have to talk to him about this.  We will follow him along. It is a was fine to talk with him. I just never know what he is going to say!!!  Lattie Haw, MD  Juan [name] 9:6

## 2016-05-07 NOTE — Progress Notes (Signed)
Informed Dr. Waldron Labs that patients bladder scan reviled 407 Ml in bladder but that I was unable to flush foley. Per MD exchange foley.

## 2016-05-08 DIAGNOSIS — R338 Other retention of urine: Secondary | ICD-10-CM

## 2016-05-08 DIAGNOSIS — D508 Other iron deficiency anemias: Secondary | ICD-10-CM

## 2016-05-08 DIAGNOSIS — N401 Enlarged prostate with lower urinary tract symptoms: Secondary | ICD-10-CM

## 2016-05-08 LAB — BASIC METABOLIC PANEL
ANION GAP: 7 (ref 5–15)
BUN: 33 mg/dL — ABNORMAL HIGH (ref 6–20)
CO2: 26 mmol/L (ref 22–32)
Calcium: 8.7 mg/dL — ABNORMAL LOW (ref 8.9–10.3)
Chloride: 104 mmol/L (ref 101–111)
Creatinine, Ser: 1.01 mg/dL (ref 0.61–1.24)
GFR calc Af Amer: >60 mL/min (ref >60)
GLUCOSE: 98 mg/dL (ref 65–99)
POTASSIUM: 4.1 mmol/L (ref 3.5–5.1)
Sodium: 137 mmol/L (ref 135–145)

## 2016-05-08 LAB — GLUCOSE, CAPILLARY
GLUCOSE-CAPILLARY: 111 mg/dL — AB (ref 65–99)
GLUCOSE-CAPILLARY: 107 mg/dL — AB (ref 65–99)
Glucose-Capillary: 94 mg/dL (ref 65–99)
Glucose-Capillary: 121 mg/dL — ABNORMAL HIGH (ref 65–99)

## 2016-05-08 LAB — ERYTHROPOIETIN: ERYTHROPOIETIN: 12.2 m[IU]/mL (ref 2.6–18.5)

## 2016-05-08 MED ORDER — DEXTROMETHORPHAN POLISTIREX ER 30 MG/5ML PO SUER
30.0000 mg | Freq: Two times a day (BID) | ORAL | Status: DC
  Administered 2016-05-08 – 2016-05-12 (×9): 30 mg via ORAL
  Filled 2016-05-08 (×10): qty 5

## 2016-05-08 MED ORDER — COLCHICINE 0.6 MG PO TABS
0.6000 mg | ORAL_TABLET | Freq: Two times a day (BID) | ORAL | Status: AC
  Administered 2016-05-08 (×2): 0.6 mg via ORAL
  Filled 2016-05-08 (×2): qty 1

## 2016-05-08 NOTE — Plan of Care (Signed)
Problem: Activity: Goal: Capacity to carry out activities will improve Outcome: Not Progressing Pt is completely dependent upon care.

## 2016-05-08 NOTE — Progress Notes (Signed)
Pt refuses to be repositioned throughout the night. He stated he is comfortable and he doesn't want to be repositioned. He stated that PT will get him up to the chair via hoyer lift in the morning.

## 2016-05-08 NOTE — Progress Notes (Signed)
Orthopedic Tech Progress Note Patient Details:  Juan [name] 1940/08/20 [id number]  Ortho Devices Type of Ortho Device: Arm sling Ortho Device/Splint Location: Applied arm sling to right arm as ordered.  Pt tolerated well. informed pt as to care of arm sling.  (Right Arm) Ortho Device/Splint Interventions: Application   Kristopher Oppenheim 05/08/2016, 2:55 PM

## 2016-05-08 NOTE — Care Management Note (Signed)
Case Management Note  Patient Details  Name: Juan [name] MRN: [id number] Date of Birth: 05-23-1940  Subjective/Objective:          Admitted with CHF          Action/Plan: Patient lives at home, his son is looking for a new place to live. Patient is refusing SNF , he is unable to take care of himself or walk. CM talked to patient with son at the bedside and his daughter Juan [name] [phone]). Patient doesn't want to pay the co pay for SNF and wants to use the money for his apt. CM talked to pt at length about the care that he needs at home. His family is unable to provide the care and they have told him. Patient is still requesting to go home at discharge; [facility] choice offered, pt chose [place]; Butch Penny with [facility] called for arrangements.  Expected Discharge Date:     Possibly 05/09/2016             Expected Discharge Plan:  [place] but patient is refusing  Discharge planning Services  CM Consult  Choice offered to:  Patient, Adult Children  HH Arranged:  RN, PT, OT, Nurse's Aide, Social Work [employer] Agency:  [place]  Status of Service:  In process, will continue to follow  Sherrilyn Rist [phone] 05/08/2016, 1:44 PM

## 2016-05-08 NOTE — Clinical Social Work Note (Signed)
CSW met with patient. Son at bedside. Discuss barriers to SNF placement. PT is recommending SNF but patient cannot afford copay. It would be around $150-$160 per day depending on the facility. CSW advised patient and his son to apply for Medicaid if they wanted to look into long-term placement. CSW discussed with Mudlogger of social work. Patient is fully oriented. His only two options are SNF with the copay or HHPT. CSW notified patient and he has chosen HHPT. CSW notified MD and RNCM.  Dayton Scrape, [place]

## 2016-05-08 NOTE — Progress Notes (Addendum)
PROGRESS NOTE                                                                                                                                                                                                             Patient Demographics:    Juan [name], is a 75 y.o. male, DOB - [birth year], BHA:[id number]  Admit date - 05/05/2016   Admitting Physician Reubin Milan, MD  Outpatient Primary MD for the patient is [name], MD  LOS - 2    Chief Complaint  Patient presents with  . Edema       Brief Narrative   75 y.o. male with medical history significant of allergic rhinitis, anemia, ascending aortic aneurysm, CAD, chronic diastolic CHF, edema, hemorrhoid, history of thrombocytosis, hypertension, migraine headaches, myeloproliferative disease, type 2 diabetes, obstructive sleep apnea not on CPAP who was recently discharged to a skilled nursing facility after being admitted for community-acquired pneumonia, congestive heart failure, urinary retention with acute on chronic renal insufficiency with chronic indwelling Foley catheter who is coming to the emergency department due to weakness and shortness of breat, only 2 hours after being discharged home from SNF.   Subjective:    Juan [name] today has, No headache, No chest pain, No abdominal pain - No Nausea, reports  generalized weakness, As well reports right wrist pain.  Assessment  & Plan :    Principal Problem:   Acute on chronic diastolic heart failure (HCC) Active Problems:   Essential hypertension   BPH (benign prostatic hyperplasia)   Weakness generalized   Thrombocytosis (HCC)   Anemia   Type II diabetes mellitus (HCC)   Acute on chronic Diastolic heart failure ([place]) - Most recent echo 12/24/2015, EF 55-60%, with a grade 1 diastolic dysfunction, BNP elevated at 185, chest x-ray with pulmonary edemaMuch better diuresis after increasing Lasix to 60 mg twice a day, -2.6 L since admission     Generalized generalized - Physical therapy consulted  Essential hypertension - Continue with Coreg and lisinopril   bladder outlet obstruction /BPH (benign prostatic hyperplasia) - Seen by urology during previous visit, and indicated that 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 They recommend continuing Foley catheter, change every 4 weeks and outpatient follow-up in 4 weeks. - Foley catheter  with no output on 12/21, tried to flush was unable, leaking urine around Foley, bladder scan showing around 500 mL, Foley catheter was changed, with 700 mL urine output, currently functioning properly.  Essential thrombocytosis/myeloproliferative syndrome - Dr. Marin Olp follow-up greatly appreciated, many transfusions admission  Type II diabetes mellitus ([place]) - Last hemoglobin A1c was 6.1% in 12/26/2015. - CBG monitoring with regular insulin sliding scale.   acute gouty arthritis - Patient complains of pain in bilateral wrists, continue with allopurintemporary relief after receiving 1 dose of IV Solu-Medrol, will give colchicine 2 today .  Code Status : Full  Family Communication  : none at bedside  Disposition Plan  : pending PT, likely would need SNF placement  Consults  :  none  Procedures  :none  DVT Prophylaxis  :  Lovenox   Lab Results  Component Value Date   PLT 516 (H) 05/07/2016    Antibiotics  :    Anti-infectives    None        Objective:   Vitals:   05/07/16 1706 05/07/16 2124 05/08/16 0452 05/08/16 1200  BP: 121/60 118/65 135/63 130/69  Pulse: 69 66 76 89  Resp: '18 20 20 20  '$ Temp: 97.6 F (36.4 C) 98.3 F (36.8 C) 98.1 F (36.7 C) 98.3 F (36.8 C)  TempSrc: Oral Oral Oral Oral  SpO2: 100% 95% 93% 98%  Weight:   90.7 kg (200 lb)   Height:        Wt Readings from Last 3 Encounters:  05/08/16 90.7 kg (200 lb)  05/04/16 87.1 kg  (192 lb)  05/01/16 87.1 kg (192 lb)     Intake/Output Summary (Last 24 hours) at 05/08/16 1443 Last data filed at 05/08/16 0830  Gross per 24 hour  Intake              370 ml  Output             2850 ml  Net            -2480 ml     Physical Exam  Awake Alert, Oriented X 3,  Supple Neck,No JVD,  Symmetrical Chest wall movement, Good air movement bilaterally, CTAB RRR,No Gallops,Rubs or new Murmurs, No Parasternal Heave +ve B.Sounds, Abd Soft, No tenderness, , No rebound - guarding or rigidity. No Cyanosis, Clubbing or edema, No new Rash or bruise , +1 edema.    Data Review:    CBC  Recent Labs Lab 05/05/16 2138 05/07/16 1010  WBC 13.4* 13.4*  HGB 8.0* 8.9*  HCT 25.7* 28.9*  PLT 562* 516*  MCV 81.6 82.1  MCH 25.4* 25.3*  MCHC 31.1 30.8  RDW 20.8* 21.1*    Chemistries   Recent Labs Lab 05/05/16 2027 05/07/16 0345 05/08/16 0410  NA 140 138 137  K 3.7 4.3 4.1  CL 109 107 104  CO2 21* 24 26  GLUCOSE 115* 137* 98  BUN 26* 28* 33*  CREATININE 0.90 1.00 1.01  CALCIUM 9.1 8.8* 8.7*  AST 24  --   --   ALT 16*  --   --   ALKPHOS 160*  --   --   BILITOT 0.2*  --   --    ------------------------------------------------------------------------------------------------------------------ No results for input(s): CHOL, HDL, LDLCALC, TRIG, CHOLHDL, LDLDIRECT in the last 72 hours.  Lab Results  Component Value Date   HGBA1C 6.1 12/26/2015   ------------------------------------------------------------------------------------------------------------------ No results for input(s): TSH, T4TOTAL, T3FREE, THYROIDAB in the last 72 hours.  Invalid input(s): FREET3 ------------------------------------------------------------------------------------------------------------------  Recent Labs  05/07/16 1010  FERRITIN 539*  TIBC 158*  IRON 20*  RETICCTPCT 3.3*    Coagulation profile No results for input(s): INR, PROTIME in the last 168 hours.  No results for  input(s): DDIMER in the last 72 hours.  Cardiac Enzymes No results for input(s): CKMB, TROPONINI, MYOGLOBIN in the last 168 hours.  Invalid input(s): CK ------------------------------------------------------------------------------------------------------------------    Component Value Date/Time   BNP 182.5 (H) 05/05/2016 2027    Inpatient Medications  Scheduled Meds: . allopurinol  100 mg Oral Daily  . aspirin  325 mg Oral Daily  . carvedilol  3.125 mg Oral BID WC  . dextromethorphan  30 mg Oral BID  . enoxaparin (LOVENOX) injection  40 mg Subcutaneous Q24H  . feeding supplement (GLUCERNA SHAKE)  237 mL Oral TID BM  . feeding supplement (PRO-STAT SUGAR FREE 64)  30 mL Oral BID  . fluticasone  2 spray Each Nare Daily  . furosemide  60 mg Intravenous BID  . guaiFENesin  1,200 mg Oral BID  . HYDROcodone-acetaminophen  1 tablet Oral Q8H  . insulin aspart  0-15 Units Subcutaneous TID WC  . lisinopril  2.5 mg Oral Daily  . multivitamin with minerals  1 tablet Oral Daily  . vitamin B-12  100 mcg Oral Daily   Continuous Infusions: PRN Meds:.acetaminophen, bisacodyl, gabapentin, simethicone  Micro Results Recent Results (from the past 240 hour(s))  MRSA PCR Screening     Status: None   Collection Time: 05/06/16  6:56 AM  Result Value Ref Range Status   MRSA by PCR NEGATIVE NEGATIVE Final    Comment:        The GeneXpert MRSA Assay (FDA approved for NASAL specimens only), is one component of a comprehensive MRSA colonization surveillance program. It is not intended to diagnose MRSA infection nor to guide or monitor treatment for MRSA infections.     Radiology Reports Ct Abdomen Pelvis Wo Contrast  Result Date: 04/15/2016 CLINICAL DATA:  Evaluate hydronephrosis. EXAM: CT ABDOMEN AND PELVIS WITHOUT CONTRAST TECHNIQUE: Multidetector CT imaging of the abdomen and pelvis was performed following the standard protocol without IV contrast. COMPARISON:  None. FINDINGS: Lower  chest: There is subsegmental atelectasis within the lung bases. There is also a mosaic attenuation pattern within the visualized portions of the lungs. No pleural effusion noted. Hepatobiliary: No focal liver abnormality is seen. No gallstones, gallbladder wall thickening, or biliary dilatation.There is subsegmental atelectasis noted in the lung bases. Pancreas: Unremarkable. No pancreatic ductal dilatation or surrounding inflammatory changes. Spleen: The spleen is enlarged measuring 15.4 cm in length. Adrenals/Urinary Tract: Normal appearance of the adrenal glands. Cyst within the inferior pole the right kidney is incompletely characterized without IV contrast measuring 3 cm. No renal calculi identified. Very mild bilateral pelvocaliectasis identified. No evidence for high-grade obstructive uropathy. The urinary bladder is collapsed around a Foley catheter. Stomach/Bowel: The stomach is normal. The small bowel loops have a normal course and caliber. Numerous colonic diverticula identified. No evidence for acute diverticulitis. Moderate stool burden is noted within the rectum. Vascular/Lymphatic: Aortic atherosclerosis. No adenopathy identified within the abdomen or the pelvis. Reproductive: Prostate is unremarkable. Other: No free fluid or fluid collections. Musculoskeletal: The bones are diffusely sclerotic compatible with widespread osseous metastatic disease. IMPRESSION: 1. Interval decompression of bilateral renal collecting systems with mild residual pelvocaliectasis status post catheter placement. At this time there is no evidence for high-grade obstructive uropathy of either kidney. 2. Splenomegaly. 3. Diffuse sclerotic bone metastases. 4. Aortic atherosclerosis.  Electronically Signed   By: Kerby Moors M.D.   On: 04/15/2016 10:25   Dg Chest 2 View  Result Date: 05/05/2016 CLINICAL DATA:  Initial evaluation for acute weakness, tenderness, fevers, chills. EXAM: CHEST  2 VIEW COMPARISON:  Prior  radiograph from 04/10/2016. FINDINGS: Cardiomegaly is stable from prior. Mediastinal silhouette within normal limits. Aortic atherosclerosis noted. Lungs are hypoinflated. Prominent perihilar vascular congestion. There are scattered multifocal airspace opacities involving the perihilar and bibasilar regions, left slightly greater than right. Findings may reflect edema or possibly multifocal infection. No pleural effusion. No pneumothorax. Linear lucency overlying the upper left chest felt to reflect a skin fold. 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 No acute osseous abnormality. IMPRESSION: 1. Scattered multifocal perihilar and bibasilar airspace opacities, which may reflect pulmonary edema or possibly multifocal pneumonia (edema is favored). 2. Stable cardiomegaly with aortic atherosclerosis. 3. 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 Electronically Signed   By: Jeannine Boga M.D.   On: 05/05/2016 21:25   Dg Wrist Complete Right  Result Date: 04/15/2016 CLINICAL DATA:  Acute right wrist pain at the radial aspect of the wrist as well as the proximal second and third metacarpals for 1 day. Right arm weakness. Initial encounter. EXAM: RIGHT WRIST - COMPLETE 3+ VIEW COMPARISON:  None. FINDINGS: There is no evidence of acute fracture or dislocation. No focal osseous lesion or osseous erosion is seen. Joint space widths are preserved. No focal soft tissue abnormality is identified. IMPRESSION: No evidence of acute osseous abnormality. Electronically Signed   By: Logan Bores M.D.   On: 04/15/2016 19:25   Nm Bone Scan Whole Body  Result Date: 04/17/2016 CLINICAL DATA:  Diffuse bone pain.  History of myelofibrosis. EXAM: NUCLEAR MEDICINE WHOLE BODY BONE SCAN TECHNIQUE: Whole body anterior and posterior images were obtained approximately 3 hours after intravenous injection  of radiopharmaceutical. RADIOPHARMACEUTICALS:  25.2 mCi Technetium-23mMDP IV COMPARISON:  Chest CT 04/01/2016.  CT abdomen and pelvis 04/14/2016. FINDINGS: There is diffusely increased radiotracer uptake throughout nearly the entirety of the axial and visualized appendicular skeleton with relative sparing of the skull. Symmetrically increased uptake is present about the hips, knees, ankles, and shoulders. The kidneys are only faintly visualized without evidence of significant radiotracer excretion into the collecting system or bladder, and this may reflect a combination of renal failure and the diffuse osseous tracer uptake. IMPRESSION: Diffusely abnormal uptake throughout the skeleton which is compatible with the patient's history of myelofibrosis. Diffuse osseous metastatic disease from a non-hematologic primary malignancy and metabolic bone diseases can also give this appearance. Electronically Signed   By: ALogan BoresM.D.   On: 04/17/2016 15:12   [place]Renal  Result Date: 04/13/2016 CLINICAL DATA:  Acute on chronic renal failure EXAM: RENAL / URINARY TRACT ULTRASOUND COMPLETE COMPARISON:  04/03/2014 FINDINGS: Right Kidney: Length: 11.1 cm. Increased echotexture. 2.8 cm lower pole cyst. Mild right hydronephrosis. Left Kidney: Length: 12.7 cm. Increased echotexture. There is severe left hydronephrosis. 1.4 cm echogenic, shadowing area in the lower pole, likely nonobstructing left renal stone. Bladder: Multiple large cystic areas within the pelvis which appear to be contiguous with the bladder compatible with diverticula and trabeculations, stable. IMPRESSION: Mild right hydronephrosis and severe left hydronephrosis. 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 Probable 14 mm nonobstructing left lower pole renal stone. Increased echotexture in the kidneys compatible with chronic medical renal disease. Electronically Signed  By: Rolm Baptise M.D.   On: 04/13/2016 13:01   Dg  Chest Port 1 View  Result Date: 04/10/2016 CLINICAL DATA:  Acute respiratory failure with hypoxia. Pulmonary edema versus other infectious or leukemic infiltrates. EXAM: PORTABLE CHEST 1 VIEW COMPARISON:  Chest x-rays dated 04/06/2016, 04/01/2016, 12/29/2015 and 01/21/2015. FINDINGS: No significant change compared to the most recent chest x-ray of 04/06/2016, with persistent bilateral perihilar and bibasilar opacities compatible with either pulmonary edema or multifocal pneumonia. No new lung findings. No pleural effusions seen. Heart size and mediastinal contours are stable. Atherosclerotic changes again noted at the aortic arch. Osseous structures appear mottled, particularly about each shoulder, compatible with the mixed sclerotic and lucent lesions described on earlier chest CT of 04/01/2016. IMPRESSION: 1. Persistent perihilar and bibasilar airspace opacities, unchanged compared to the most recent chest x-ray of 04/06/2016, compatible with either pulmonary edema or multifocal pneumonia, favor pulmonary edema. No new lung findings. 2. Mottled appearance of the osseous structures, particularly prominent about each shoulder, compatible with the mixed sclerotic and lucent lesions described on chest CT of 04/01/2016, highly suspicious for metastatic disease. 3. Aortic atherosclerosis. Electronically Signed   By: Franki Cabot M.D.   On: 04/10/2016 19:06       Cherrell Maybee M.D on 05/08/2016 at 2:43 PM  Between 7am to 7pm - Pager - [phone]  After 7pm go to [email or web address] - password [facility]  [facility] -  Office  [phone]

## 2016-05-08 NOTE — Progress Notes (Signed)
   Per request, chaplain prayed and blessed pt. w/ oil.  Will follow, as needed.   - Rev. [place] MDiv ThM

## 2016-05-08 NOTE — Plan of Care (Signed)
Problem: Skin Integrity: Goal: Risk for impaired skin integrity will decrease Outcome: Progressing Pt has a wound between buttocks, foam dressing applied, refused larger foam dressing, barrier cream applied to buttocks and above the wound, c/o burning at wound site, pt repositioned from side to side while cleaning, but refused to be positioned laterally to relieve pressure.

## 2016-05-08 NOTE — Consult Note (Signed)
   [facility] Inpatient Consult   05/08/2016  [name] [birth year] [id number]   Patient was evaluated for [facility] Management services.  Patient had discharged from a skilled facility and admitted to the hospital due to increased shortness of breath Juan [name] is a 75 y.o. male with medical history significant of allergic rhinitis, anemia, ascending aortic aneurysm, CAD, chronic diastolic CHF, edema, hemorrhoid, history of thrombocytosis, hypertension, migraine headaches, myeloproliferative disease, type 2 diabetes, obstructive sleep apnea.  Admitted with weakness and HF exacerbation.  Patient has had a HX with [facility] program prior to skilled facility. Spoke with inpatient RNCM regarding disposition issues and needs.  Patient's current plan is undetermined.  He recently refused [facility] Care Management services for follow up.  Will continue to follow as appropriate.  For questions, please contact:  [name], RN BSN [facility] Liaison  [phone] business mobile phone Toll free office [phone]

## 2016-05-08 NOTE — Progress Notes (Signed)
Per Md, pt ok to receive air mattress and d/c robitussin replace with delsym q12. md also gave verbal order for right arm sling.

## 2016-05-09 LAB — BASIC METABOLIC PANEL
Anion gap: 8 (ref 5–15)
BUN: 33 mg/dL — AB (ref 6–20)
CALCIUM: 8.9 mg/dL (ref 8.9–10.3)
CHLORIDE: 106 mmol/L (ref 101–111)
CO2: 26 mmol/L (ref 22–32)
CREATININE: 0.83 mg/dL (ref 0.61–1.24)
GFR calc Af Amer: >60 mL/min (ref >60)
GFR calc non Af Amer: >60 mL/min (ref >60)
GLUCOSE: 105 mg/dL — AB (ref 65–99)
Potassium: 4 mmol/L (ref 3.5–5.1)
Sodium: 140 mmol/L (ref 135–145)

## 2016-05-09 LAB — GLUCOSE, CAPILLARY
Glucose-Capillary: 103 mg/dL — ABNORMAL HIGH (ref 65–99)
Glucose-Capillary: 108 mg/dL — ABNORMAL HIGH (ref 65–99)
Glucose-Capillary: 112 mg/dL — ABNORMAL HIGH (ref 65–99)
Glucose-Capillary: 118 mg/dL — ABNORMAL HIGH (ref 65–99)

## 2016-05-09 NOTE — Progress Notes (Signed)
PROGRESS NOTE                                                                                                                                                                                                             Patient Demographics:    Juan [name], is a 75 y.o. male, DOB - [birth year], [id number]  Admit date - 05/05/2016   Admitting Physician Reubin Milan, MD  Outpatient Primary MD for the patient is [name], MD  LOS - 3    Chief Complaint  Patient presents with  . Edema       Brief Narrative   75 y.o. male with medical history significant of allergic rhinitis, anemia, ascending aortic aneurysm, CAD, chronic diastolic CHF, edema, hemorrhoid, history of thrombocytosis, hypertension, migraine headaches, myeloproliferative disease, type 2 diabetes, obstructive sleep apnea not on CPAP who was recently discharged to a skilled nursing facility after being admitted for community-acquired pneumonia, congestive heart failure, urinary retention with acute on chronic renal insufficiency with chronic indwelling Foley catheter who is coming to the emergency department due to weakness and shortness of breat, only 2 hours after being discharged home from SNF.   Subjective:    Juan [name] today has, No headache, No chest pain, No abdominal pain - No Nausea, reports  generalized weakness, As well reports right wrist pain.  Assessment  & Plan :    Principal Problem:   Acute on chronic diastolic heart failure (HCC) Active Problems:   Essential hypertension   BPH (benign prostatic hyperplasia)   Weakness generalized   Thrombocytosis (HCC)   Anemia   Type II diabetes mellitus (HCC)   Acute on chronic Diastolic heart failure ([place]) - Most recent echo 12/24/2015, EF 55-60%, with a grade 1 diastolic dysfunction, BNP elevated at 185, chest x-ray with pulmonary edema. - Improving with IV diuresis, continue 60 mg IV twice a day, so far -6.4 L since  admission .  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 They recommend continuing Foley catheter, change every 4 weeks and outpatient follow-up in 4 weeks. -  Foley catheter with no output on 12/21, tried to flush was unable, leaking urine around Foley, bladder scan showing around 500 mL, Foley catheter was changed, with 700 mL urine output, currently functioning properly.  Essential thrombocytosis/myeloproliferative syndrome - Dr. Marin Olp follow-up greatly appreciated, many transfusions admission  Type II diabetes mellitus ([place]) - Last hemoglobin A1c was 6.1% in 12/26/2015. - CBG monitoring with  insulin sliding scale.   acute gouty arthritis - Patient complains of pain in bilateral wrists, continue with allopurinl.   Code Status : Full  Family Communication  : none at bedside  Disposition Plan  : Home with home PT in 2-3 days after appropriate diuresis days  Consults  :  none  Procedures  :none  DVT Prophylaxis  :  Lovenox   Lab Results  Component Value Date   PLT 516 (H) 05/07/2016    Antibiotics  :    Anti-infectives    None        Objective:   Vitals:   05/08/16 1200 05/08/16 2018 05/09/16 0531 05/09/16 1217  BP: 130/69 127/63 138/67 (!) 142/66  Pulse: 89 92 76 81  Resp: '20 20 20 18  '$ Temp: 98.3 F (36.8 C) 97.9 F (36.6 C) 97.8 F (36.6 C) 97.8 F (36.6 C)  TempSrc: Oral  Oral Oral  SpO2: 98% 92% 99% 90%  Weight:   86.6 kg (191 lb)   Height:        Wt Readings from Last 3 Encounters:  05/09/16 86.6 kg (191 lb)  05/04/16 87.1 kg (192 lb)  05/01/16 87.1 kg (192 lb)     Intake/Output Summary  (Last 24 hours) at 05/09/16 1328 Last data filed at 05/09/16 1000  Gross per 24 hour  Intake              994 ml  Output             4602 ml  Net            -3608 ml     Physical Exam  Awake Alert, Oriented X 3,  Supple Neck,No JVD,  Symmetrical Chest wall movement, Good air movement bilaterally, CTAB RRR,No Gallops,Rubs or new Murmurs, No Parasternal Heave +ve B.Sounds, Abd Soft, No tenderness, , No rebound - guarding or rigidity. No Cyanosis, Clubbing or edema, No new Rash or bruise , mild edema.    Data Review:    CBC  Recent Labs Lab 05/05/16 2138 05/07/16 1010  WBC 13.4* 13.4*  HGB 8.0* 8.9*  HCT 25.7* 28.9*  PLT 562* 516*  MCV 81.6 82.1  MCH 25.4* 25.3*  MCHC 31.1 30.8  RDW 20.8* 21.1*    Chemistries   Recent Labs Lab 05/05/16 2027 05/07/16 0345 05/08/16 0410 05/09/16 0228  NA 140 138 137 140  K 3.7 4.3 4.1 4.0  CL 109 107 104 106  CO2 21* '24 26 26  '$ GLUCOSE 115* 137* 98 105*  BUN 26* 28* 33* 33*  CREATININE 0.90 1.00 1.01 0.83  CALCIUM 9.1 8.8* 8.7* 8.9  AST 24  --   --   --   ALT 16*  --   --   --   ALKPHOS 160*  --   --   --   BILITOT 0.2*  --   --   --    ------------------------------------------------------------------------------------------------------------------ No results for input(s): CHOL, HDL, LDLCALC, TRIG, CHOLHDL, LDLDIRECT in the last 72 hours.  Lab Results  Component Value Date   HGBA1C 6.1 12/26/2015   ------------------------------------------------------------------------------------------------------------------ No  results for input(s): TSH, T4TOTAL, T3FREE, THYROIDAB in the last 72 hours.  Invalid input(s): FREET3 ------------------------------------------------------------------------------------------------------------------  Recent Labs  05/07/16 1010  FERRITIN 539*  TIBC 158*  IRON 20*  RETICCTPCT 3.3*    Coagulation profile No results for input(s): INR, PROTIME in the last 168 hours.  No results for  input(s): DDIMER in the last 72 hours.  Cardiac Enzymes No results for input(s): CKMB, TROPONINI, MYOGLOBIN in the last 168 hours.  Invalid input(s): CK ------------------------------------------------------------------------------------------------------------------    Component Value Date/Time   BNP 182.5 (H) 05/05/2016 2027    Inpatient Medications  Scheduled Meds: . allopurinol  100 mg Oral Daily  . aspirin  325 mg Oral Daily  . carvedilol  3.125 mg Oral BID WC  . dextromethorphan  30 mg Oral BID  . enoxaparin (LOVENOX) injection  40 mg Subcutaneous Q24H  . feeding supplement (GLUCERNA SHAKE)  237 mL Oral TID BM  . feeding supplement (PRO-STAT SUGAR FREE 64)  30 mL Oral BID  . fluticasone  2 spray Each Nare Daily  . furosemide  60 mg Intravenous BID  . guaiFENesin  1,200 mg Oral BID  . HYDROcodone-acetaminophen  1 tablet Oral Q8H  . insulin aspart  0-15 Units Subcutaneous TID WC  . lisinopril  2.5 mg Oral Daily  . multivitamin with minerals  1 tablet Oral Daily  . vitamin B-12  100 mcg Oral Daily   Continuous Infusions: PRN Meds:.acetaminophen, bisacodyl, gabapentin, simethicone  Micro Results Recent Results (from the past 240 hour(s))  MRSA PCR Screening     Status: None   Collection Time: 05/06/16  6:56 AM  Result Value Ref Range Status   MRSA by PCR NEGATIVE NEGATIVE Final    Comment:        The GeneXpert MRSA Assay (FDA approved for NASAL specimens only), is one component of a comprehensive MRSA colonization surveillance program. It is not intended to diagnose MRSA infection nor to guide or monitor treatment for MRSA infections.     Radiology Reports Ct Abdomen Pelvis Wo Contrast  Result Date: 04/15/2016 CLINICAL DATA:  Evaluate hydronephrosis. EXAM: CT ABDOMEN AND PELVIS WITHOUT CONTRAST TECHNIQUE: Multidetector CT imaging of the abdomen and pelvis was performed following the standard protocol without IV contrast. COMPARISON:  None. FINDINGS: Lower  chest: There is subsegmental atelectasis within the lung bases. There is also a mosaic attenuation pattern within the visualized portions of the lungs. No pleural effusion noted. Hepatobiliary: No focal liver abnormality is seen. No gallstones, gallbladder wall thickening, or biliary dilatation.There is subsegmental atelectasis noted in the lung bases. Pancreas: Unremarkable. No pancreatic ductal dilatation or surrounding inflammatory changes. Spleen: The spleen is enlarged measuring 15.4 cm in length. Adrenals/Urinary Tract: Normal appearance of the adrenal glands. Cyst within the inferior pole the right kidney is incompletely characterized without IV contrast measuring 3 cm. No renal calculi identified. Very mild bilateral pelvocaliectasis identified. No evidence for high-grade obstructive uropathy. The urinary bladder is collapsed around a Foley catheter. Stomach/Bowel: The stomach is normal. The small bowel loops have a normal course and caliber. Numerous colonic diverticula identified. No evidence for acute diverticulitis. Moderate stool burden is noted within the rectum. Vascular/Lymphatic: Aortic atherosclerosis. No adenopathy identified within the abdomen or the pelvis. Reproductive: Prostate is unremarkable. Other: No free fluid or fluid collections. Musculoskeletal: The bones are diffusely sclerotic compatible with widespread osseous metastatic disease. IMPRESSION: 1. Interval decompression of bilateral renal collecting systems with mild residual pelvocaliectasis status post catheter placement. At this time there is no  evidence for high-grade obstructive uropathy of either kidney. 2. Splenomegaly. 3. Diffuse sclerotic bone metastases. 4. Aortic atherosclerosis. Electronically Signed   By: Kerby Moors M.D.   On: 04/15/2016 10:25   Dg Chest 2 View  Result Date: 05/05/2016 CLINICAL DATA:  Initial evaluation for acute weakness, tenderness, fevers, chills. EXAM: CHEST  2 VIEW COMPARISON:  Prior  radiograph from 04/10/2016. FINDINGS: Cardiomegaly is stable from prior. Mediastinal silhouette within normal limits. Aortic atherosclerosis noted. Lungs are hypoinflated. Prominent perihilar vascular congestion. There are scattered multifocal airspace opacities involving the perihilar and bibasilar regions, left slightly greater than right. Findings may reflect edema or possibly multifocal infection. No pleural effusion. No pneumothorax. Linear lucency overlying the upper left chest felt to reflect a skin fold. 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 No acute osseous abnormality. IMPRESSION: 1. Scattered multifocal perihilar and bibasilar airspace opacities, which may reflect pulmonary edema or possibly multifocal pneumonia (edema is favored). 2. Stable cardiomegaly with aortic atherosclerosis. 3. 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 Electronically Signed   By: Jeannine Boga M.D.   On: 05/05/2016 21:25   Dg Wrist Complete Right  Result Date: 04/15/2016 CLINICAL DATA:  Acute right wrist pain at the radial aspect of the wrist as well as the proximal second and third metacarpals for 1 day. Right arm weakness. Initial encounter. EXAM: RIGHT WRIST - COMPLETE 3+ VIEW COMPARISON:  None. FINDINGS: There is no evidence of acute fracture or dislocation. No focal osseous lesion or osseous erosion is seen. Joint space widths are preserved. No focal soft tissue abnormality is identified. IMPRESSION: No evidence of acute osseous abnormality. Electronically Signed   By: Logan Bores M.D.   On: 04/15/2016 19:25   Nm Bone Scan Whole Body  Result Date: 04/17/2016 CLINICAL DATA:  Diffuse bone pain.  History of myelofibrosis. EXAM: NUCLEAR MEDICINE WHOLE BODY BONE SCAN TECHNIQUE: Whole body anterior and posterior images were obtained approximately 3 hours after intravenous injection  of radiopharmaceutical. RADIOPHARMACEUTICALS:  25.2 mCi Technetium-65mMDP IV COMPARISON:  Chest CT 04/01/2016.  CT abdomen and pelvis 04/14/2016. FINDINGS: There is diffusely increased radiotracer uptake throughout nearly the entirety of the axial and visualized appendicular skeleton with relative sparing of the skull. Symmetrically increased uptake is present about the hips, knees, ankles, and shoulders. The kidneys are only faintly visualized without evidence of significant radiotracer excretion into the collecting system or bladder, and this may reflect a combination of renal failure and the diffuse osseous tracer uptake. IMPRESSION: Diffusely abnormal uptake throughout the skeleton which is compatible with the patient's history of myelofibrosis. Diffuse osseous metastatic disease from a non-hematologic primary malignancy and metabolic bone diseases can also give this appearance. Electronically Signed   By: ALogan BoresM.D.   On: 04/17/2016 15:12   [place]  Result Date: 04/13/2016 CLINICAL DATA:  Acute on chronic renal failure EXAM: RENAL / URINARY TRACT ULTRASOUND COMPLETE COMPARISON:  04/03/2014 FINDINGS: Right Kidney: Length: 11.1 cm. Increased echotexture. 2.8 cm lower pole cyst. Mild right hydronephrosis. Left Kidney: Length: 12.7 cm. Increased echotexture. There is severe left hydronephrosis. 1.4 cm echogenic, shadowing area in the lower pole, likely nonobstructing left renal stone. Bladder: Multiple large cystic areas within the pelvis which appear to be contiguous with the bladder compatible with diverticula and trabeculations, stable. IMPRESSION: Mild right hydronephrosis and severe left hydronephrosis. 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 Probable 14 mm nonobstructing  left lower pole renal stone. Increased echotexture in the kidneys compatible with chronic medical renal disease. Electronically Signed   By: Rolm Baptise M.D.   On: 04/13/2016 13:01   Dg  Chest Port 1 View  Result Date: 04/10/2016 CLINICAL DATA:  Acute respiratory failure with hypoxia. Pulmonary edema versus other infectious or leukemic infiltrates. EXAM: PORTABLE CHEST 1 VIEW COMPARISON:  Chest x-rays dated 04/06/2016, 04/01/2016, 12/29/2015 and 01/21/2015. FINDINGS: No significant change compared to the most recent chest x-ray of 04/06/2016, with persistent bilateral perihilar and bibasilar opacities compatible with either pulmonary edema or multifocal pneumonia. No new lung findings. No pleural effusions seen. Heart size and mediastinal contours are stable. Atherosclerotic changes again noted at the aortic arch. Osseous structures appear mottled, particularly about each shoulder, compatible with the mixed sclerotic and lucent lesions described on earlier chest CT of 04/01/2016. IMPRESSION: 1. Persistent perihilar and bibasilar airspace opacities, unchanged compared to the most recent chest x-ray of 04/06/2016, compatible with either pulmonary edema or multifocal pneumonia, favor pulmonary edema. No new lung findings. 2. Mottled appearance of the osseous structures, particularly prominent about each shoulder, compatible with the mixed sclerotic and lucent lesions described on chest CT of 04/01/2016, highly suspicious for metastatic disease. 3. Aortic atherosclerosis. Electronically Signed   By: Franki Cabot M.D.   On: 04/10/2016 19:06       ELGERGAWY, DAWOOD M.D on 05/09/2016 at 1:28 PM  Between 7am to 7pm - Pager - [phone]  After 7pm go to [email or web address] - password [facility]  [facility] -  Office  [phone]

## 2016-05-10 LAB — GLUCOSE, CAPILLARY
GLUCOSE-CAPILLARY: 97 mg/dL (ref 65–99)
Glucose-Capillary: 123 mg/dL — ABNORMAL HIGH (ref 65–99)
Glucose-Capillary: 95 mg/dL (ref 65–99)
Glucose-Capillary: 111 mg/dL — ABNORMAL HIGH (ref 65–99)

## 2016-05-10 LAB — BASIC METABOLIC PANEL
Anion gap: 8 (ref 5–15)
BUN: 30 mg/dL — ABNORMAL HIGH (ref 6–20)
CALCIUM: 9.1 mg/dL (ref 8.9–10.3)
CO2: 26 mmol/L (ref 22–32)
CREATININE: 0.75 mg/dL (ref 0.61–1.24)
Chloride: 105 mmol/L (ref 101–111)
GFR calc Af Amer: >60 mL/min (ref >60)
Glucose, Bld: 103 mg/dL — ABNORMAL HIGH (ref 65–99)
Potassium: 4.3 mmol/L (ref 3.5–5.1)
SODIUM: 139 mmol/L (ref 135–145)

## 2016-05-10 MED ORDER — FUROSEMIDE 10 MG/ML IJ SOLN
40.0000 mg | Freq: Two times a day (BID) | INTRAMUSCULAR | Status: DC
  Administered 2016-05-10: 40 mg via INTRAVENOUS
  Filled 2016-05-10: qty 4

## 2016-05-10 MED ORDER — MENTHOL 3 MG MT LOZG
1.0000 | LOZENGE | OROMUCOSAL | Status: DC | PRN
  Administered 2016-05-10 – 2016-05-11 (×4): 3 mg via ORAL
  Filled 2016-05-10 (×2): qty 9

## 2016-05-10 NOTE — Progress Notes (Signed)
PROGRESS NOTE                                                                                                                                                                                                             Patient Demographics:    Juan [name], is a 75 y.o. male, DOB - [birth year], NLZ:[id number]  Admit date - 05/05/2016   Admitting Physician Reubin Milan, MD  Outpatient Primary MD for the patient is [name], MD  LOS - 4    Chief Complaint  Patient presents with  . Edema       Brief Narrative   75 y.o. male with medical history significant of allergic rhinitis, anemia, ascending aortic aneurysm, CAD, chronic diastolic CHF, edema, hemorrhoid, history of thrombocytosis, hypertension, migraine headaches, myeloproliferative disease, type 2 diabetes, obstructive sleep apnea not on CPAP who was recently discharged to a skilled nursing facility after being admitted for community-acquired pneumonia, congestive heart failure, urinary retention with acute on chronic renal insufficiency with chronic indwelling Foley catheter who is coming to the emergency department due to weakness and shortness of breat, only 2 hours after being discharged home from SNF.   Subjective:    [name] today has, No headache, No chest pain, No abdominal pain - No Nausea, reports  generalized weakness.  Assessment  & Plan :    Principal Problem:   Acute on chronic diastolic heart failure (HCC) Active Problems:   Essential hypertension   BPH (benign prostatic hyperplasia)   Weakness generalized   Thrombocytosis (HCC)   Anemia   Type II diabetes mellitus (HCC)   Acute on chronic Diastolic heart failure (Juan [name]) - Most recent echo 12/24/2015, EF 55-60%, with a grade 1 diastolic dysfunction, BNP elevated at 185, chest x-ray with pulmonary edema. - Improving with IV diuresis, I/O - 3.9 over last 24 hours, so will decrease his diuresis from 60-40 IV twice a day  .  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 They recommend continuing Foley catheter, change every 4 weeks and outpatient follow-up in 4 weeks. - Foley  catheter with no output on 12/21, tried to flush was unable, leaking urine around Foley, bladder scan showing around 500 mL, Foley catheter was changed, with 700 mL urine output, currently functioning properly.  Essential thrombocytosis/myeloproliferative syndrome - Dr. Marin Olp follow-up greatly appreciated,  Type II diabetes mellitus ([place]) - Last hemoglobin A1c was 6.1% in 12/26/2015. - CBG monitoring with  insulin sliding scale.   acute gouty arthritis - Patient complains of pain in bilateral wrists, continue with allopurinl.   Code Status : Full  Family Communication  : none at bedside  Disposition Plan  : pending further work up.  Consults  :  none  Procedures  :none  DVT Prophylaxis  :  Lovenox   Lab Results  Component Value Date   PLT 516 (H) 05/07/2016    Antibiotics  :    Anti-infectives    None        Objective:   Vitals:   05/09/16 1217 05/09/16 2230 05/10/16 0459 05/10/16 1136  BP: (!) 142/66 133/82 (!) 142/76 139/67  Pulse: 81 90 82 64  Resp: '18 18 18 18  '$ Temp: 97.8 F (36.6 C) 98.4 F (36.9 C) 98.6 F (37 C) 98.2 F (36.8 C)  TempSrc: Oral Oral Oral Oral  SpO2: 90% 90% 93% 99%  Weight:   88 kg (194 lb)   Height:        Wt Readings from Last 3 Encounters:  05/10/16 88 kg (194 lb)  05/04/16 87.1 kg (192 lb)  05/01/16 87.1 kg (192 lb)     Intake/Output Summary (Last 24 hours) at 05/10/16 1156 Last data filed at 05/10/16 0600   Gross per 24 hour  Intake              760 ml  Output             3650 ml  Net            -2890 ml     Physical Exam  Awake Alert, Oriented X 3,  Supple Neck,No JVD,  Symmetrical Chest wall movement, Good air movement bilaterally, CTAB RRR,No Gallops,Rubs or new Murmurs, No Parasternal Heave +ve B.Sounds, Abd Soft, No tenderness, , No rebound - guarding or rigidity. No Cyanosis, Clubbing or edema, No new Rash or bruise , mild edema.    Data Review:    CBC  Recent Labs Lab 05/05/16 2138 05/07/16 1010  WBC 13.4* 13.4*  HGB 8.0* 8.9*  HCT 25.7* 28.9*  PLT 562* 516*  MCV 81.6 82.1  MCH 25.4* 25.3*  MCHC 31.1 30.8  RDW 20.8* 21.1*    Chemistries   Recent Labs Lab 05/05/16 2027 05/07/16 0345 05/08/16 0410 05/09/16 0228 05/10/16 0355  NA 140 138 137 140 139  K 3.7 4.3 4.1 4.0 4.3  CL 109 107 104 106 105  CO2 21* '24 26 26 26  '$ GLUCOSE 115* 137* 98 105* 103*  BUN 26* 28* 33* 33* 30*  CREATININE 0.90 1.00 1.01 0.83 0.75  CALCIUM 9.1 8.8* 8.7* 8.9 9.1  AST 24  --   --   --   --   ALT 16*  --   --   --   --   ALKPHOS 160*  --   --   --   --   BILITOT 0.2*  --   --   --   --    ------------------------------------------------------------------------------------------------------------------ No results for input(s): CHOL, HDL, LDLCALC, TRIG, CHOLHDL, LDLDIRECT in the last 72 hours.  Lab Results  Component Value Date   HGBA1C 6.1 12/26/2015   ------------------------------------------------------------------------------------------------------------------ No results for input(s): TSH, T4TOTAL, T3FREE, THYROIDAB in the last 72 hours.  Invalid input(s): FREET3 ------------------------------------------------------------------------------------------------------------------ No results for input(s): VITAMINB12, FOLATE, FERRITIN, TIBC, IRON, RETICCTPCT in the last 72 hours.  Coagulation profile No results for input(s): INR, PROTIME in the last 168 hours.  No  results for input(s): DDIMER in the last 72 hours.  Cardiac Enzymes No results for input(s): CKMB, TROPONINI, MYOGLOBIN in the last 168 hours.  Invalid input(s): CK ------------------------------------------------------------------------------------------------------------------    Component Value Date/Time   BNP 182.5 (H) 05/05/2016 2027    Inpatient Medications  Scheduled Meds: . allopurinol  100 mg Oral Daily  . aspirin  325 mg Oral Daily  . carvedilol  3.125 mg Oral BID WC  . dextromethorphan  30 mg Oral BID  . enoxaparin (LOVENOX) injection  40 mg Subcutaneous Q24H  . feeding supplement (GLUCERNA SHAKE)  237 mL Oral TID BM  . feeding supplement (PRO-STAT SUGAR FREE 64)  30 mL Oral BID  . fluticasone  2 spray Each Nare Daily  . furosemide  40 mg Intravenous BID  . guaiFENesin  1,200 mg Oral BID  . HYDROcodone-acetaminophen  1 tablet Oral Q8H  . insulin aspart  0-15 Units Subcutaneous TID WC  . lisinopril  2.5 mg Oral Daily  . multivitamin with minerals  1 tablet Oral Daily  . vitamin B-12  100 mcg Oral Daily   Continuous Infusions: PRN Meds:.acetaminophen, bisacodyl, gabapentin, menthol-cetylpyridinium, simethicone  Micro Results Recent Results (from the past 240 hour(s))  MRSA PCR Screening     Status: None   Collection Time: 05/06/16  6:56 AM  Result Value Ref Range Status   MRSA by PCR NEGATIVE NEGATIVE Final    Comment:        The GeneXpert MRSA Assay (FDA approved for NASAL specimens only), is one component of a comprehensive MRSA colonization surveillance program. It is not intended to diagnose MRSA infection nor to guide or monitor treatment for MRSA infections.     Radiology Reports Ct Abdomen Pelvis Wo Contrast  Result Date: 04/15/2016 CLINICAL DATA:  Evaluate hydronephrosis. EXAM: CT ABDOMEN AND PELVIS WITHOUT CONTRAST TECHNIQUE: Multidetector CT imaging of the abdomen and pelvis was performed following the standard protocol without IV contrast.  COMPARISON:  None. FINDINGS: Lower chest: There is subsegmental atelectasis within the lung bases. There is also a mosaic attenuation pattern within the visualized portions of the lungs. No pleural effusion noted. Hepatobiliary: No focal liver abnormality is seen. No gallstones, gallbladder wall thickening, or biliary dilatation.There is subsegmental atelectasis noted in the lung bases. Pancreas: Unremarkable. No pancreatic ductal dilatation or surrounding inflammatory changes. Spleen: The spleen is enlarged measuring 15.4 cm in length. Adrenals/Urinary Tract: Normal appearance of the adrenal glands. Cyst within the inferior pole the right kidney is incompletely characterized without IV contrast measuring 3 cm. No renal calculi identified. Very mild bilateral pelvocaliectasis identified. No evidence for high-grade obstructive uropathy. The urinary bladder is collapsed around a Foley catheter. Stomach/Bowel: The stomach is normal. The small bowel loops have a normal course and caliber. Numerous colonic diverticula identified. No evidence for acute diverticulitis. Moderate stool burden is noted within the rectum. Vascular/Lymphatic: Aortic atherosclerosis. No adenopathy identified within the abdomen or the pelvis. Reproductive: Prostate is unremarkable. Other: No free fluid or fluid collections. Musculoskeletal: The bones are diffusely sclerotic compatible with widespread osseous metastatic disease. IMPRESSION: 1. Interval decompression of bilateral renal collecting systems with mild residual pelvocaliectasis status post  catheter placement. At this time there is no evidence for high-grade obstructive uropathy of either kidney. 2. Splenomegaly. 3. Diffuse sclerotic bone metastases. 4. Aortic atherosclerosis. Electronically Signed   By: Kerby Moors M.D.   On: 04/15/2016 10:25   Dg Chest 2 View  Result Date: 05/05/2016 CLINICAL DATA:  Initial evaluation for acute weakness, tenderness, fevers, chills. EXAM: CHEST   2 VIEW COMPARISON:  Prior radiograph from 04/10/2016. FINDINGS: Cardiomegaly is stable from prior. Mediastinal silhouette within normal limits. Aortic atherosclerosis noted. Lungs are hypoinflated. Prominent perihilar vascular congestion. There are scattered multifocal airspace opacities involving the perihilar and bibasilar regions, left slightly greater than right. Findings may reflect edema or possibly multifocal infection. No pleural effusion. No pneumothorax. Linear lucency overlying the upper left chest felt to reflect a skin fold. 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 No acute osseous abnormality. IMPRESSION: 1. Scattered multifocal perihilar and bibasilar airspace opacities, which may reflect pulmonary edema or possibly multifocal pneumonia (edema is favored). 2. Stable cardiomegaly with aortic atherosclerosis. 3. 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 Electronically Signed   By: Jeannine Boga M.D.   On: 05/05/2016 21:25   Dg Wrist Complete Right  Result Date: 04/15/2016 CLINICAL DATA:  Acute right wrist pain at the radial aspect of the wrist as well as the proximal second and third metacarpals for 1 day. Right arm weakness. Initial encounter. EXAM: RIGHT WRIST - COMPLETE 3+ VIEW COMPARISON:  None. FINDINGS: There is no evidence of acute fracture or dislocation. No focal osseous lesion or osseous erosion is seen. Joint space widths are preserved. No focal soft tissue abnormality is identified. IMPRESSION: No evidence of acute osseous abnormality. Electronically Signed   By: Logan Bores M.D.   On: 04/15/2016 19:25   Nm Bone Scan Whole Body  Result Date: 04/17/2016 CLINICAL DATA:  Diffuse bone pain.  History of myelofibrosis. EXAM: NUCLEAR MEDICINE WHOLE BODY BONE SCAN TECHNIQUE: Whole body anterior and posterior images were obtained approximately 3 hours  after intravenous injection of radiopharmaceutical. RADIOPHARMACEUTICALS:  25.2 mCi Technetium-23mMDP IV COMPARISON:  Chest CT 04/01/2016.  CT abdomen and pelvis 04/14/2016. FINDINGS: There is diffusely increased radiotracer uptake throughout nearly the entirety of the axial and visualized appendicular skeleton with relative sparing of the skull. Symmetrically increased uptake is present about the hips, knees, ankles, and shoulders. The kidneys are only faintly visualized without evidence of significant radiotracer excretion into the collecting system or bladder, and this may reflect a combination of renal failure and the diffuse osseous tracer uptake. IMPRESSION: Diffusely abnormal uptake throughout the skeleton which is compatible with the patient's history of myelofibrosis. Diffuse osseous metastatic disease from a non-hematologic primary malignancy and metabolic bone diseases can also give this appearance. Electronically Signed   By: ALogan BoresM.D.   On: 04/17/2016 15:12   [place]al  Result Date: 04/13/2016 CLINICAL DATA:  Acute on chronic renal failure EXAM: RENAL / URINARY TRACT ULTRASOUND COMPLETE COMPARISON:  04/03/2014 FINDINGS: Right Kidney: Length: 11.1 cm. Increased echotexture. 2.8 cm lower pole cyst. Mild right hydronephrosis. Left Kidney: Length: 12.7 cm. Increased echotexture. There is severe left hydronephrosis. 1.4 cm echogenic, shadowing area in the lower pole, likely nonobstructing left renal stone. Bladder: Multiple large cystic areas within the pelvis which appear to be contiguous with the bladder compatible with diverticula and trabeculations, stable. IMPRESSION: Mild right hydronephrosis and severe left hydronephrosis. Diverticula noted within the bladder with  trabeculations, similar prior study. Probable 14 mm nonobstructing left lower pole renal stone. Increased echotexture in the kidneys compatible with chronic medical renal disease. Electronically Signed   By: Rolm Baptise M.D.    On: 04/13/2016 13:01   Dg Chest Port 1 View  Result Date: 04/10/2016 CLINICAL DATA:  Acute respiratory failure with hypoxia. Pulmonary edema versus other infectious or leukemic infiltrates. EXAM: PORTABLE CHEST 1 VIEW COMPARISON:  Chest x-rays dated 04/06/2016, 04/01/2016, 12/29/2015 and 01/21/2015. FINDINGS: No significant change compared to the most recent chest x-ray of 04/06/2016, with persistent bilateral perihilar and bibasilar opacities compatible with either pulmonary edema or multifocal pneumonia. No new lung findings. No pleural effusions seen. Heart size and mediastinal contours are stable. Atherosclerotic changes again noted at the aortic arch. Osseous structures appear mottled, particularly about each shoulder, compatible with the mixed sclerotic and lucent lesions described on earlier chest CT of 04/01/2016. IMPRESSION: 1. Persistent perihilar and bibasilar airspace opacities, unchanged compared to the most recent chest x-ray of 04/06/2016, compatible with either pulmonary edema or multifocal pneumonia, favor pulmonary edema. No new lung findings. 2. Mottled appearance of the osseous structures, particularly prominent about each shoulder, compatible with the mixed sclerotic and lucent lesions described on chest CT of 04/01/2016, highly suspicious for metastatic disease. 3. Aortic atherosclerosis. Electronically Signed   By: Franki Cabot M.D.   On: 04/10/2016 19:06       ELGERGAWY, DAWOOD M.D on 05/10/2016 at 11:56 AM  Between 7am to 7pm - Pager - [phone]  After 7pm go to [email or web address] - password [facility]  [facility] -  Office  [phone]

## 2016-05-10 NOTE — Care Management Note (Signed)
Case Management Note  Patient Details  Name: Juan [name] MRN: [id number] Date of Birth: March 19, 1941  Subjective/Objective:                   CHF Action/Plan:  Discharge planning Expected Discharge Date:                 Expected Discharge Plan:  Skilled Nursing Facility  In-House Referral:     Discharge planning Services  CM Consult  Post Acute Care Choice:    Choice offered to:  Patient, Adult Children  DME Arranged:    DME Agency:     HH Arranged:    [place] Agency:     Status of Service:  Completed, signed off  If discussed at [employer] of Stay Meetings, dates discussed:    Additional Comments: CM received call from MD who states pt would like to discuss the option of SNF; MD states he has already touched base with CSW and pt's days may have been exhausted.  Pt states he does not feel his son can take care of him at home any longer and would like to be made hospice at a SNF.  CM relayed message to MD and has placed a call to CSW to please pursue hospice at [facility] for pt.  Pt states his only concern in the past for hospice was he was afraid if he was hospice, "they would just let him die;" CM explained to pt he is still medically treated for infections, accidents, etc. But the goal of hospice and palliative care is one of comfort; pt states "this really puts my mind at ease."  The only other request pt has is he does NOT want to go back to [facility] and would like CSW to look into [facility] or "any other SNF other than [facility]." Dellie Catholic, RN 05/10/2016, 1:13 PM

## 2016-05-11 LAB — GLUCOSE, CAPILLARY
GLUCOSE-CAPILLARY: 113 mg/dL — AB (ref 65–99)
GLUCOSE-CAPILLARY: 121 mg/dL — AB (ref 65–99)
Glucose-Capillary: 106 mg/dL — ABNORMAL HIGH (ref 65–99)
Glucose-Capillary: 114 mg/dL — ABNORMAL HIGH (ref 65–99)

## 2016-05-11 LAB — BASIC METABOLIC PANEL
ANION GAP: 6 (ref 5–15)
BUN: 30 mg/dL — ABNORMAL HIGH (ref 6–20)
CALCIUM: 9 mg/dL (ref 8.9–10.3)
CO2: 30 mmol/L (ref 22–32)
Chloride: 103 mmol/L (ref 101–111)
Creatinine, Ser: 0.91 mg/dL (ref 0.61–1.24)
GLUCOSE: 98 mg/dL (ref 65–99)
POTASSIUM: 4.1 mmol/L (ref 3.5–5.1)
SODIUM: 139 mmol/L (ref 135–145)

## 2016-05-11 MED ORDER — FUROSEMIDE 40 MG PO TABS
40.0000 mg | ORAL_TABLET | Freq: Every day | ORAL | Status: DC
  Administered 2016-05-11: 40 mg via ORAL
  Filled 2016-05-11: qty 1

## 2016-05-11 NOTE — Progress Notes (Signed)
PROGRESS NOTE                                                                                                                                                                                                             Patient Demographics:    Juan [name], is a 75 y.o. male, DOB - March 20, 1941, ZOX:[id number]  Admit date - 05/05/2016   Admitting Physician Reubin Milan, MD  Outpatient Primary MD for the patient is [name], MD  LOS - 5    Chief Complaint  Patient presents with  . Edema       Brief Narrative   75 y.o. male with medical history significant of allergic rhinitis, anemia, ascending aortic aneurysm, CAD, chronic diastolic CHF, edema, hemorrhoid, history of thrombocytosis, hypertension, migraine headaches, myeloproliferative disease, type 2 diabetes, obstructive sleep apnea not on CPAP who was recently discharged to a skilled nursing facility after being admitted for community-acquired pneumonia, congestive heart failure, urinary retention with acute on chronic renal insufficiency with chronic indwelling Foley catheter who is coming to the emergency department due to weakness and shortness of breat, only 2 hours after being discharged home from SNF.   Subjective:    Juan [name] today has, No headache, No chest pain, No abdominal pain - No Nausea, reports  generalized weakness.  Assessment  & Plan :    Principal Problem:   Acute on chronic diastolic heart failure (HCC) Active Problems:   Essential hypertension   BPH (benign prostatic hyperplasia)   Weakness generalized   Thrombocytosis (HCC)   Anemia   Type II diabetes mellitus (HCC)   Acute on chronic Diastolic heart failure ([place]) - Most recent echo 12/24/2015, EF 55-60%, with a grade 1 diastolic dysfunction, BNP elevated at 185, chest x-ray with pulmonary edema. - Improving with IV diuresis, -13 L since admission, currently euvolemic, will transition to oral Lasix 40 mg  daily.  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 They recommend continuing Foley catheter, change every 4 weeks and outpatient follow-up in 4 weeks. - Foley catheter with no output on  12/21, tried to flush was unable, leaking urine around Foley, bladder scan showing around 500 mL, Foley catheter was changed, with 700 mL urine output, currently functioning properly.  Essential thrombocytosis/myeloproliferative syndrome - Dr. Marin Olp follow-up greatly appreciated,  Type II diabetes mellitus ([place]) - Last hemoglobin A1c was 6.1% in 12/26/2015. - CBG monitoring with  insulin sliding scale.   acute gouty arthritis - Patient complains of pain in bilateral wrists, continue with allopurinl.   Code Status : Full  Family Communication  : none at bedside  Disposition Plan  : Plan for SNF hospice in a.m.  Consults  :  none  Procedures  :none  DVT Prophylaxis  :  Lovenox   Lab Results  Component Value Date   PLT 516 (H) 05/07/2016    Antibiotics  :    Anti-infectives    None        Objective:   Vitals:   05/10/16 0459 05/10/16 1136 05/10/16 2026 05/11/16 0435  BP: (!) 142/76 139/67 (!) 140/94 133/82  Pulse: 82 64 92 85  Resp: '18 18 18 18  '$ Temp: 98.6 F (37 C) 98.2 F (36.8 C) 98.5 F (36.9 C) (!) 96 F (35.6 C)  TempSrc: Oral Oral Oral Oral  SpO2: 93% 99% 94% 92%  Weight: 88 kg (194 lb)   88.5 kg (195 lb)  Height:        Wt Readings from Last 3 Encounters:  05/11/16 88.5 kg (195 lb)  05/04/16 87.1 kg (192 lb)  05/01/16 87.1 kg (192 lb)     Intake/Output Summary (Last 24 hours) at 05/11/16 1314 Last data  filed at 05/11/16 1243  Gross per 24 hour  Intake             1080 ml  Output             2450 ml  Net            -1370 ml     Physical Exam  Awake Alert, Oriented X 3,  Supple Neck,No JVD,  Symmetrical Chest wall movement, Good air movement bilaterally, CTAB RRR,No Gallops,Rubs or new Murmurs, No Parasternal Heave +ve B.Sounds, Abd Soft, No tenderness, , No rebound - guarding or rigidity. No Cyanosis, Clubbing or edema, No new Rash or bruise , no  edema.    Data Review:    CBC  Recent Labs Lab 05/05/16 2138 05/07/16 1010  WBC 13.4* 13.4*  HGB 8.0* 8.9*  HCT 25.7* 28.9*  PLT 562* 516*  MCV 81.6 82.1  MCH 25.4* 25.3*  MCHC 31.1 30.8  RDW 20.8* 21.1*    Chemistries   Recent Labs Lab 05/05/16 2027 05/07/16 0345 05/08/16 0410 05/09/16 0228 05/10/16 0355 05/11/16 0414  NA 140 138 137 140 139 139  K 3.7 4.3 4.1 4.0 4.3 4.1  CL 109 107 104 106 105 103  CO2 21* '24 26 26 26 30  '$ GLUCOSE 115* 137* 98 105* 103* 98  BUN 26* 28* 33* 33* 30* 30*  CREATININE 0.90 1.00 1.01 0.83 0.75 0.91  CALCIUM 9.1 8.8* 8.7* 8.9 9.1 9.0  AST 24  --   --   --   --   --   ALT 16*  --   --   --   --   --   ALKPHOS 160*  --   --   --   --   --   BILITOT 0.2*  --   --   --   --   --    ------------------------------------------------------------------------------------------------------------------  No results for input(s): CHOL, HDL, LDLCALC, TRIG, CHOLHDL, LDLDIRECT in the last 72 hours.  Lab Results  Component Value Date   HGBA1C 6.1 12/26/2015   ------------------------------------------------------------------------------------------------------------------ No results for input(s): TSH, T4TOTAL, T3FREE, THYROIDAB in the last 72 hours.  Invalid input(s): FREET3 ------------------------------------------------------------------------------------------------------------------ No results for input(s): VITAMINB12, FOLATE, FERRITIN, TIBC, IRON, RETICCTPCT in the last 72  hours.  Coagulation profile No results for input(s): INR, PROTIME in the last 168 hours.  No results for input(s): DDIMER in the last 72 hours.  Cardiac Enzymes No results for input(s): CKMB, TROPONINI, MYOGLOBIN in the last 168 hours.  Invalid input(s): CK ------------------------------------------------------------------------------------------------------------------    Component Value Date/Time   BNP 182.5 (H) 05/05/2016 2027    Inpatient Medications  Scheduled Meds: . allopurinol  100 mg Oral Daily  . aspirin  325 mg Oral Daily  . carvedilol  3.125 mg Oral BID WC  . dextromethorphan  30 mg Oral BID  . enoxaparin (LOVENOX) injection  40 mg Subcutaneous Q24H  . feeding supplement (GLUCERNA SHAKE)  237 mL Oral TID BM  . feeding supplement (PRO-STAT SUGAR FREE 64)  30 mL Oral BID  . fluticasone  2 spray Each Nare Daily  . furosemide  40 mg Oral Daily  . guaiFENesin  1,200 mg Oral BID  . HYDROcodone-acetaminophen  1 tablet Oral Q8H  . insulin aspart  0-15 Units Subcutaneous TID WC  . lisinopril  2.5 mg Oral Daily  . multivitamin with minerals  1 tablet Oral Daily  . vitamin B-12  100 mcg Oral Daily   Continuous Infusions: PRN Meds:.acetaminophen, bisacodyl, gabapentin, menthol-cetylpyridinium, simethicone  Micro Results Recent Results (from the past 240 hour(s))  MRSA PCR Screening     Status: None   Collection Time: 05/06/16  6:56 AM  Result Value Ref Range Status   MRSA by PCR NEGATIVE NEGATIVE Final    Comment:        The GeneXpert MRSA Assay (FDA approved for NASAL specimens only), is one component of a comprehensive MRSA colonization surveillance program. It is not intended to diagnose MRSA infection nor to guide or monitor treatment for MRSA infections.     Radiology Reports Ct Abdomen Pelvis Wo Contrast  Result Date: 04/15/2016 CLINICAL DATA:  Evaluate hydronephrosis. EXAM: CT ABDOMEN AND PELVIS WITHOUT CONTRAST TECHNIQUE: Multidetector CT imaging  of the abdomen and pelvis was performed following the standard protocol without IV contrast. COMPARISON:  None. FINDINGS: Lower chest: There is subsegmental atelectasis within the lung bases. There is also a mosaic attenuation pattern within the visualized portions of the lungs. No pleural effusion noted. Hepatobiliary: No focal liver abnormality is seen. No gallstones, gallbladder wall thickening, or biliary dilatation.There is subsegmental atelectasis noted in the lung bases. Pancreas: Unremarkable. No pancreatic ductal dilatation or surrounding inflammatory changes. Spleen: The spleen is enlarged measuring 15.4 cm in length. Adrenals/Urinary Tract: Normal appearance of the adrenal glands. Cyst within the inferior pole the right kidney is incompletely characterized without IV contrast measuring 3 cm. No renal calculi identified. Very mild bilateral pelvocaliectasis identified. No evidence for high-grade obstructive uropathy. The urinary bladder is collapsed around a Foley catheter. Stomach/Bowel: The stomach is normal. The small bowel loops have a normal course and caliber. Numerous colonic diverticula identified. No evidence for acute diverticulitis. Moderate stool burden is noted within the rectum. Vascular/Lymphatic: Aortic atherosclerosis. No adenopathy identified within the abdomen or the pelvis. Reproductive: Prostate is unremarkable. Other: No free fluid or fluid collections. Musculoskeletal: The bones are diffusely sclerotic compatible with  widespread osseous metastatic disease. IMPRESSION: 1. Interval decompression of bilateral renal collecting systems with mild residual pelvocaliectasis status post catheter placement. At this time there is no evidence for high-grade obstructive uropathy of either kidney. 2. Splenomegaly. 3. Diffuse sclerotic bone metastases. 4. Aortic atherosclerosis. Electronically Signed   By: Kerby Moors M.D.   On: 04/15/2016 10:25   Dg Chest 2 View  Result Date:  05/05/2016 CLINICAL DATA:  Initial evaluation for acute weakness, tenderness, fevers, chills. EXAM: CHEST  2 VIEW COMPARISON:  Prior radiograph from 04/10/2016. FINDINGS: Cardiomegaly is stable from prior. Mediastinal silhouette within normal limits. Aortic atherosclerosis noted. Lungs are hypoinflated. Prominent perihilar vascular congestion. There are scattered multifocal airspace opacities involving the perihilar and bibasilar regions, left slightly greater than right. Findings may reflect edema or possibly multifocal infection. No pleural effusion. No pneumothorax. Linear lucency overlying the upper left chest felt to reflect a skin fold. 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 No acute osseous abnormality. IMPRESSION: 1. Scattered multifocal perihilar and bibasilar airspace opacities, which may reflect pulmonary edema or possibly multifocal pneumonia (edema is favored). 2. Stable cardiomegaly with aortic atherosclerosis. 3. 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 Electronically Signed   By: Jeannine Boga M.D.   On: 05/05/2016 21:25   Dg Wrist Complete Right  Result Date: 04/15/2016 CLINICAL DATA:  Acute right wrist pain at the radial aspect of the wrist as well as the proximal second and third metacarpals for 1 day. Right arm weakness. Initial encounter. EXAM: RIGHT WRIST - COMPLETE 3+ VIEW COMPARISON:  None. FINDINGS: There is no evidence of acute fracture or dislocation. No focal osseous lesion or osseous erosion is seen. Joint space widths are preserved. No focal soft tissue abnormality is identified. IMPRESSION: No evidence of acute osseous abnormality. Electronically Signed   By: Logan Bores M.D.   On: 04/15/2016 19:25   Nm Bone Scan Whole Body  Result Date: 04/17/2016 CLINICAL DATA:  Diffuse bone pain.  History of myelofibrosis. EXAM: NUCLEAR MEDICINE  WHOLE BODY BONE SCAN TECHNIQUE: Whole body anterior and posterior images were obtained approximately 3 hours after intravenous injection of radiopharmaceutical. RADIOPHARMACEUTICALS:  25.2 mCi Technetium-18mMDP IV COMPARISON:  Chest CT 04/01/2016.  CT abdomen and pelvis 04/14/2016. FINDINGS: There is diffusely increased radiotracer uptake throughout nearly the entirety of the axial and visualized appendicular skeleton with relative sparing of the skull. Symmetrically increased uptake is present about the hips, knees, ankles, and shoulders. The kidneys are only faintly visualized without evidence of significant radiotracer excretion into the collecting system or bladder, and this may reflect a combination of renal failure and the diffuse osseous tracer uptake. IMPRESSION: Diffusely abnormal uptake throughout the skeleton which is compatible with the patient's history of myelofibrosis. Diffuse osseous metastatic disease from a non-hematologic primary malignancy and metabolic bone diseases can also give this appearance. Electronically Signed   By: ALogan BoresM.D.   On: 04/17/2016 15:12   [place]  Result Date: 04/13/2016 CLINICAL DATA:  Acute on chronic renal failure EXAM: RENAL / URINARY TRACT ULTRASOUND COMPLETE COMPARISON:  04/03/2014 FINDINGS: Right Kidney: Length: 11.1 cm. Increased echotexture. 2.8 cm lower pole cyst. Mild right hydronephrosis. Left Kidney: Length: 12.7 cm. Increased echotexture. There is severe left hydronephrosis. 1.4 cm echogenic, shadowing area in the lower pole, likely nonobstructing left renal stone. Bladder: Multiple large cystic areas within the pelvis which appear to be contiguous with the bladder compatible  with diverticula and trabeculations, stable. IMPRESSION: Mild right hydronephrosis and severe left hydronephrosis. 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 Probable 14 mm nonobstructing left lower pole renal stone. Increased echotexture  in the kidneys compatible with chronic medical renal disease. Electronically Signed   By: Rolm Baptise M.D.   On: 04/13/2016 13:01       Cristol Engdahl M.D on 05/11/2016 at 1:14 PM  Between 7am to 7pm - Pager - [phone]  After 7pm go to [email or web address] - password [facility]  [facility] -  Office  [phone]

## 2016-05-12 DIAGNOSIS — E119 Type 2 diabetes mellitus without complications: Secondary | ICD-10-CM | POA: Diagnosis not present

## 2016-05-12 DIAGNOSIS — R531 Weakness: Secondary | ICD-10-CM | POA: Diagnosis not present

## 2016-05-12 DIAGNOSIS — I5032 Chronic diastolic (congestive) heart failure: Secondary | ICD-10-CM | POA: Diagnosis not present

## 2016-05-12 DIAGNOSIS — I10 Essential (primary) hypertension: Secondary | ICD-10-CM | POA: Diagnosis not present

## 2016-05-12 DIAGNOSIS — R339 Retention of urine, unspecified: Secondary | ICD-10-CM | POA: Diagnosis not present

## 2016-05-12 DIAGNOSIS — I2510 Atherosclerotic heart disease of native coronary artery without angina pectoris: Secondary | ICD-10-CM | POA: Diagnosis not present

## 2016-05-12 DIAGNOSIS — D473 Essential (hemorrhagic) thrombocythemia: Secondary | ICD-10-CM | POA: Diagnosis not present

## 2016-05-12 DIAGNOSIS — R2689 Other abnormalities of gait and mobility: Secondary | ICD-10-CM | POA: Diagnosis not present

## 2016-05-12 DIAGNOSIS — N183 Chronic kidney disease, stage 3 (moderate): Secondary | ICD-10-CM | POA: Diagnosis not present

## 2016-05-12 DIAGNOSIS — R262 Difficulty in walking, not elsewhere classified: Secondary | ICD-10-CM | POA: Diagnosis not present

## 2016-05-12 DIAGNOSIS — N390 Urinary tract infection, site not specified: Secondary | ICD-10-CM | POA: Diagnosis not present

## 2016-05-12 DIAGNOSIS — H04123 Dry eye syndrome of bilateral lacrimal glands: Secondary | ICD-10-CM | POA: Diagnosis not present

## 2016-05-12 DIAGNOSIS — R41841 Cognitive communication deficit: Secondary | ICD-10-CM | POA: Diagnosis not present

## 2016-05-12 DIAGNOSIS — D649 Anemia, unspecified: Secondary | ICD-10-CM | POA: Diagnosis not present

## 2016-05-12 DIAGNOSIS — R278 Other lack of coordination: Secondary | ICD-10-CM | POA: Diagnosis not present

## 2016-05-12 DIAGNOSIS — N401 Enlarged prostate with lower urinary tract symptoms: Secondary | ICD-10-CM | POA: Diagnosis not present

## 2016-05-12 DIAGNOSIS — I509 Heart failure, unspecified: Secondary | ICD-10-CM | POA: Diagnosis not present

## 2016-05-12 DIAGNOSIS — D471 Chronic myeloproliferative disease: Secondary | ICD-10-CM | POA: Diagnosis not present

## 2016-05-12 DIAGNOSIS — J189 Pneumonia, unspecified organism: Secondary | ICD-10-CM | POA: Diagnosis not present

## 2016-05-12 DIAGNOSIS — G4733 Obstructive sleep apnea (adult) (pediatric): Secondary | ICD-10-CM | POA: Diagnosis not present

## 2016-05-12 DIAGNOSIS — I5033 Acute on chronic diastolic (congestive) heart failure: Secondary | ICD-10-CM | POA: Diagnosis not present

## 2016-05-12 DIAGNOSIS — E1140 Type 2 diabetes mellitus with diabetic neuropathy, unspecified: Secondary | ICD-10-CM | POA: Diagnosis not present

## 2016-05-12 DIAGNOSIS — M6281 Muscle weakness (generalized): Secondary | ICD-10-CM | POA: Diagnosis not present

## 2016-05-12 DIAGNOSIS — R5381 Other malaise: Secondary | ICD-10-CM | POA: Diagnosis not present

## 2016-05-12 DIAGNOSIS — N179 Acute kidney failure, unspecified: Secondary | ICD-10-CM | POA: Diagnosis not present

## 2016-05-12 DIAGNOSIS — I711 Thoracic aortic aneurysm, ruptured: Secondary | ICD-10-CM | POA: Diagnosis not present

## 2016-05-12 LAB — GLUCOSE, CAPILLARY
GLUCOSE-CAPILLARY: 105 mg/dL — AB (ref 65–99)
GLUCOSE-CAPILLARY: 119 mg/dL — AB (ref 65–99)
Glucose-Capillary: 135 mg/dL — ABNORMAL HIGH (ref 65–99)

## 2016-05-12 MED ORDER — LISINOPRIL 2.5 MG PO TABS: 2.5000 mg | ORAL_TABLET | Freq: Every day | ORAL | 0 refills | Status: DC

## 2016-05-12 MED ORDER — FUROSEMIDE 10 MG/ML IJ SOLN
40.0000 mg | Freq: Once | INTRAMUSCULAR | Status: AC
  Administered 2016-05-12: 40 mg via INTRAVENOUS
  Filled 2016-05-12: qty 4

## 2016-05-12 MED ORDER — ACETAMINOPHEN 325 MG PO TABS: 650.0000 mg | ORAL_TABLET | Freq: Four times a day (QID) | ORAL | Status: DC | PRN

## 2016-05-12 MED ORDER — FUROSEMIDE 40 MG PO TABS: 40.0000 mg | ORAL_TABLET | Freq: Two times a day (BID) | ORAL | 0 refills | Status: DC

## 2016-05-12 MED ORDER — HYDROCODONE-ACETAMINOPHEN 5-325 MG PO TABS: 1.0000 | ORAL_TABLET | Freq: Three times a day (TID) | ORAL | 0 refills | Status: DC

## 2016-05-12 MED ORDER — DEXTROMETHORPHAN POLISTIREX ER 30 MG/5ML PO SUER: 30.0000 mg | Freq: Every evening | ORAL | 0 refills | Status: DC | PRN

## 2016-05-12 MED ORDER — FUROSEMIDE 40 MG PO TABS: 40.0000 mg | ORAL_TABLET | Freq: Two times a day (BID) | ORAL | Status: DC

## 2016-05-12 NOTE — Clinical Social Work Note (Signed)
Clinical Social Worker facilitated patient discharge including contacting patient family and facility to confirm patient discharge plans.  Clinical information faxed to facility and family agreeable with plan.  CSW arranged ambulance transport via [facility] to [facility] .  RN to call [phone] for report prior to discharge. Pt will be going to room 215 A.  Clinical Social Worker will sign off for now as social work intervention is no longer needed. Please consult [place] again if new need arises.  [address], [place]

## 2016-05-12 NOTE — Discharge Instructions (Signed)
Follow with Primary MD [name], MD / or palliative  Care physician at SNF  Get CBC, CMP, checked  by Primary MD next visit.    Activity: As tolerated with Full fall precautions use walker/cane & assistance as needed   Disposition SNF palliative care   Diet: Heart Healthy , with 1500 cc fluid restriction, with feeding assistance and aspiration precautions.  For Heart failure patients - Check your Weight same time everyday, if you gain over 2 pounds, or you develop in leg swelling, experience more shortness of breath or chest pain, call your Primary MD immediately. Follow Cardiac Low Salt Diet and 1.5 lit/day fluid restriction.   On your next visit with your primary care physician please Get Medicines reviewed and adjusted.   Please request your Prim.MD to go over all Hospital Tests and Procedure/Radiological results at the follow up, please get all Hospital records sent to your Prim MD by signing hospital release before you go home.   If you experience worsening of your admission symptoms, develop shortness of breath, life threatening emergency, suicidal or homicidal thoughts you must seek medical attention immediately by calling 911 or calling your MD immediately  if symptoms less severe.  You Must read complete instructions/literature along with all the possible adverse reactions/side effects for all the Medicines you take and that have been prescribed to you. Take any new Medicines after you have completely understood and accpet all the possible adverse reactions/side effects.   Do not drive, operating heavy machinery, perform activities at heights, swimming or participation in water activities or provide baby sitting services if your were admitted for syncope or siezures until you have seen by Primary MD or a Neurologist and advised to do so again.  Do not drive when taking Pain medications.    Do not take more than prescribed Pain, Sleep and Anxiety Medications  Special  Instructions: If you have smoked or chewed Tobacco  in the last 2 yrs please stop smoking, stop any regular Alcohol  and or any Recreational drug use.  Wear Seat belts while driving.   Please note  You were cared for by a hospitalist during your hospital stay. If you have any questions about your discharge medications or the care you received while you were in the hospital after you are discharged, you can call the unit and asked to speak with the hospitalist on call if the hospitalist that took care of you is not available. Once you are discharged, your primary care physician will handle any further medical issues. Please note that NO REFILLS for any discharge medications will be authorized once you are discharged, as it is imperative that you return to your primary care physician (or establish a relationship with a primary care physician if you do not have one) for your aftercare needs so that they can reassess your need for medications and monitor your lab values.

## 2016-05-12 NOTE — Clinical Social Work Placement (Signed)
   CLINICAL SOCIAL WORK PLACEMENT  NOTE  Date:  05/12/2016  Patient Details  Name: Juan [name] MRN: [id number] Date of Birth: 1941/04/27  Clinical Social Work is seeking post-discharge placement for this patient at the [place] level of care (*CSW will initial, date and re-position this form in  chart as items are completed):      Patient/family provided with [place] Work Department's list of facilities offering this level of care within the geographic area requested by the patient (or if unable, by the patient's family).  Yes   Patient/family informed of their freedom to choose among providers that offer the needed level of care, that participate in Medicare, Medicaid or managed care program needed by the patient, have an available bed and are willing to accept the patient.      Patient/family informed of [facility]'s ownership interest in [facility] and [facility], as well as of the fact that they are under no obligation to receive care at these facilities.  PASRR submitted to EDS on       PASRR number received on 05/08/16     Existing PASRR number confirmed on       FL2 transmitted to all facilities in geographic area requested by pt/family on 05/08/16     FL2 transmitted to all facilities within larger geographic area on       Patient informed that his/her managed care company has contracts with or will negotiate with certain facilities, including the following:        Yes   Patient/family informed of bed offers received.  Patient chooses bed at [facility]     Physician recommends and patient chooses bed at      Patient to be transferred to [facility] on 05/12/16.  Patient to be transferred to facility by [facility]     Patient family notified on 05/12/16 of transfer.  Name of family member notified:  Pt and pt's daughter     PHYSICIAN       Additional Comment:    _______________________________________________ Alla German, LCSW 05/12/2016, 4:35 PM

## 2016-05-12 NOTE — Progress Notes (Signed)
Physical Therapy Treatment Patient Details Name: Juan [name] MRN: [id number] DOB: 1940/07/02 Today's Date: 05/12/2016    History of Present Illness Pt is a 75 y/o male with recent hospitalization on 11/20 secondary to CAP (four total admissions in the last six months). Pt d/c'd to SNF after last hospitalization and was home for one day but then returned to the hospital secondary to weakness and SOB. PMH including but not limited to CAD, CHF, HTN, DM and chronic indwelling catheter.     PT Comments    Patient seen for activity progression. Tolerated increased time at EOB for activity and exercise. Attempted transfers x3. Patient unable to reach upright despite multiple attempts and increased physical assist. VSS stable during session with saturations on room air >92% throughout activity. Educated patient on positioning and ROM activities. Patient assisted back to bed. At this time, continue to recommend ST SNF upon acute discharge. Will continue to follow.  Follow Up Recommendations  SNF     Equipment Recommendations  None recommended by PT    Recommendations for Other Services       Precautions / Restrictions Precautions Precautions: Fall Restrictions Weight Bearing Restrictions: No    Mobility  Bed Mobility Overal bed mobility: Needs Assistance Bed Mobility: Rolling;Sidelying to Sit;Sit to Supine Rolling: Min assist Sidelying to sit: Mod assist   Sit to supine: Mod assist   General bed mobility comments: moderate assist to elevate trunk, patient able to utilize side rail to assist with UE. Assist for LE elevation upon return to supine  Transfers Overall transfer level: Needs assistance Equipment used: Rolling walker (2 wheeled) Transfers: Sit to/from Stand Sit to Stand: +2 physical assistance;Mod assist;Max assist         General transfer comment: Patient attempted x3, only able to clear buttocks breifly with increased physical assist  Ambulation/Gait              General Gait Details: unable to perform   Stairs            Wheelchair Mobility    Modified Rankin (Stroke Patients Only)       Balance Overall balance assessment: Needs assistance Sitting-balance support: Feet supported;Bilateral upper extremity supported Sitting balance-Leahy Scale: Poor Sitting balance - Comments: able to sit intermittently at EOB without assist   Standing balance support: Bilateral upper extremity supported Standing balance-Leahy Scale: Zero Standing balance comment: use of RW for UE support, unable to reach upright position                    Cognition Arousal/Alertness: Awake/alert Behavior During Therapy: Flat affect Overall Cognitive Status: No family/caregiver present to determine baseline cognitive functioning                 General Comments: very perseverative    Exercises General Exercises - Lower Extremity Ankle Circles/Pumps: AROM;Both;5 reps;Seated Long Arc Quad: AROM;Both;5 reps;Seated    General Comments        Pertinent Vitals/Pain Pain Assessment: Faces Faces Pain Scale: Hurts little more Pain Location: back and right shoulder Pain Descriptors / Indicators: Sore Pain Intervention(s): Monitored during session    Home Living                      Prior Function            PT Goals (current goals can now be found in the care plan section) Acute Rehab PT Goals Patient Stated Goal: to get stronger and walk again PT  Goal Formulation: With patient Time For Goal Achievement: 05/21/16 Potential to Achieve Goals: Fair Progress towards PT goals: Progressing toward goals    Frequency    Min 2X/week      PT Plan Current plan remains appropriate;Frequency needs to be updated    Co-evaluation             End of Session   Activity Tolerance: Patient limited by fatigue Patient left: in bed;with call [place]/phone within reach;with bed alarm set;with family/visitor present     Time:  0355-9741 PT Time Calculation (min) (ACUTE ONLY): 27 min  Charges:  $Therapeutic Exercise: 8-22 mins $Therapeutic Activity: 8-22 mins                    G Codes:      Duncan Dull Jun 06, 2016, [place], PT DPT  [phone]

## 2016-05-12 NOTE — Discharge Summary (Addendum)
Juan [name], is a 75 y.o. male  DOB 1940-06-17  MRN [id number].  Admission date:  05/05/2016  Admitting Physician  Reubin Milan, MD  Discharge Date:  05/12/2016   Primary MD  [name], MD  Recommendations for primary care physician for things to follow:  - To be followed and  seen  palliative care at the facility, obtain labs including CBC, BMP in 3 days, adjust diuresis as needed.  Admission Diagnosis  Acute congestive heart failure, unspecified congestive heart failure type ([place]) [I50.9]   Discharge Diagnosis  Acute congestive heart failure, unspecified congestive heart failure type ([place]) [I50.9]    Principal Problem:   Acute on chronic diastolic heart failure (HCC) Active Problems:   Essential hypertension   BPH (benign prostatic hyperplasia)   Weakness generalized   Thrombocytosis (HCC)   Anemia   Type II diabetes mellitus ([place])      Past Medical History:  Diagnosis Date  . Allergic rhinitis   . Anemia   . Ascending aortic aneurysm ([place]) 03/2014   4.3cm on CT scan  . CAD (coronary artery disease)    dx elsewheer in past, no documentation. Non-ischemic myoview 2007  . Chronic diastolic CHF (congestive heart failure), NYHA class 2 (HCC)    Normal EF w/ grade 1 dd by echo 12/2015  . Edema    R>L leg, u/s 5-12 neg for DVT  . Hemorrhoid   . History of thrombocytosis   . Hypertension   . Migraine    "once/wk at least" (07/11/2013)  . Myeloproliferative disease ([place])   . Shortness of breath   . Sinus congestion   . Sleep apnea, obstructive    at some point used CPAP, was d/c  years ago  . Type II diabetes mellitus ([place])     Past Surgical History:  Procedure Laterality Date  . TOE SURGERY Right    "tried to straighten out big toe" (07/11/2013)       History of present illness and  Hospital Course:     Kindly see H&P for history of present illness and admission details,  please review complete Labs, Consult reports and Test reports for all details in brief  HPI  from the history and physical done on the day of admission 05/12/2016 HPI: Random Juan [name] is a 75 y.o. male with medical history significant of allergic rhinitis, anemia, ascending aortic aneurysm, CAD, chronic diastolic CHF, edema, hemorrhoid, history of thrombocytosis, hypertension, migraine headaches, myeloproliferative disease, type 2 diabetes, obstructive sleep apnea not on CPAP who was recently discharged to a skilled nursing facility after being admitted for community-acquired pneumonia, congestive heart failure, urinary retention with acute on chronic renal insufficiency with chronic indwelling Foley catheter who is coming to the emergency department due to weakness and shortness of breath.  Per patient, after he was discharged from the hospital to the rehabilitation facility yesterday he has continued to be weak, dsypneic with worsening edema in lower extremities and hands. He denies fever, chills, chest pain, palpitations, dizziness, diaphoresis, abdominal pain, diarrhea,  melena or hematochezia.  ED Course: The patient received furosemide 40 mg IVP in the emergency department. He is BNP level was 782.5 pg/mL, troponin level 0.02 ng/mL, WBC of 13.4, hemoglobin level of 8.0 g/dL, platelets 562. BUN was 26, creatinine 0.90 and glucose 115 mg/dL. His chest radiograph favored a pattern of vascular congestion over recurring pneumonia.   Hospital Course  Brief Narrative   75 y.o.malewith medical history significant of allergic rhinitis, anemia, ascending aortic aneurysm, CAD, chronic diastolic CHF, edema, hemorrhoid, history of thrombocytosis, hypertension, migraine headaches, myeloproliferative disease, type 2 diabetes, obstructive sleep apnea not on CPAP who was recently discharged to a skilled nursing facility after being admitted for community-acquired pneumonia, congestive heart failure, urinary retention  with acute on chronic renal insufficiency with chronic indwelling Foley catheter who is coming to the emergency department due to weakness and shortness of breat, only 2 hours after being discharged home from SNF.   Acute on chronic Diastolic heart failure ([place]) - Most recent echo 12/24/2015, EF 55-60%, with a grade 1 diastolic dysfunction, BNP elevated at 185, chest x-ray with pulmonary edema. - Treated with IV diuresis during hospital stay , diuresed -11.1 L during hospital stay , currently euvolemic, will be able to transition to oral Lasix 40 mg oral twice a day . Started to continue with low-salt diet and fluid restriction.  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 They recommend continuing Foley catheter, change every 4 weeks and outpatient follow-up in 4 weeks. - Foley catheter with no output on 12/21, tried to flush was unable, leaking urine around Foley, bladder scan showing around 500 mL, Foley catheter was changed, with 700 mL urine output, currently functioning properly.  Essential thrombocytosis/myeloproliferative syndrome - Dr. Marin Olp follow-up greatly appreciated,  Type II diabetes mellitus ([place]) - Last hemoglobin A1c was 6.1 in 12/26/2015. - Controlled with diet  acute gouty arthritis - Patient complains of pain in bilateral wrists pain on admission, received steroids with resolution of symptoms, continue with allopurinl.   Discharge Condition:  fair   Follow UP  Contact information for after-discharge care    Destination    [facility] .   Specialty:  [place] information: [address] [place] [phone]                Discharge Instructions  and  Discharge Medications    Discharge Instructions    Discharge instructions    Complete by:  As directed    Follow with Primary MD [name], MD / or palliative care  physician at SNF  Get CBC, CMP, checked  by Primary MD next visit.    Activity: As tolerated with Full fall precautions use walker/cane & assistance as needed   Disposition SNF palliative care   Diet: Heart Healthy , with 1500 cc fluid restriction, with feeding assistance and aspiration precautions.  For Heart failure patients - Check your Weight same time everyday, if you gain over 2 pounds, or you develop in leg swelling, experience more shortness of breath or chest pain, call your Primary MD immediately. Follow Cardiac Low Salt Diet and 1.5 lit/day fluid restriction.   On your next visit with your primary care  physician please Get Medicines reviewed and adjusted.   Please request your Prim.MD to go over all Hospital Tests and Procedure/Radiological results at the follow up, please get all Hospital records sent to your Prim MD by signing hospital release before you go home.   If you experience worsening of your admission symptoms, develop shortness of breath, life threatening emergency, suicidal or homicidal thoughts you must seek medical attention immediately by calling 911 or calling your MD immediately  if symptoms less severe.  You Must read complete instructions/literature along with all the possible adverse reactions/side effects for all the Medicines you take and that have been prescribed to you. Take any new Medicines after you have completely understood and accpet all the possible adverse reactions/side effects.   Do not drive, operating heavy machinery, perform activities at heights, swimming or participation in water activities or provide baby sitting services  if your were admitted for syncope or siezures until you have seen by Primary MD or a Neurologist and advised to do so again.  Do not drive when taking Pain medications.    Do not take more than prescribed Pain, Sleep and Anxiety Medications  Special Instructions: If you have smoked or chewed Tobacco  in the last 2 yrs please stop smoking, stop any regular Alcohol  and or any Recreational drug use.  Wear Seat belts while driving.   Please note  You were cared for by a hospitalist during your hospital stay. If you have any questions about your discharge medications or the care you received while you were in the hospital after you are discharged, you can call the unit and asked to speak with the hospitalist on call if the hospitalist that took care of you is not available. Once you are discharged, your primary care physician will handle any further medical issues. Please note that NO REFILLS for any discharge medications will be authorized once you are discharged, as it is imperative that you return to your primary care physician (or establish a relationship with a primary care physician if you do not have one) for your aftercare needs so that they can reassess your need for medications and monitor your lab values.   Face-to-face encounter (required for Medicare/Medicaid patients)    Complete by:  As directed    I Johnna Acosta certify that this patient is under my care and that I, or a nurse practitioner or physician's assistant working with me, had a face-to-face encounter that meets the physician face-to-face encounter requirements with this patient on 05/05/2016. The encounter with the patient was in whole, or in part for the following medical condition(s) which is the primary reason for home health care (List medical condition): generalized weakness, edema, bony myopathy   The encounter with the patient was in whole, or in part, for the following medical condition, which is the primary reason  for home health care:  ambulation fall risk   I certify that, based on my findings, the following services are medically necessary home health services:   Nursing Physical therapy     Reason for Medically Necessary Home Health Services:  Skilled Nursing- Change/Decline in Patient Status   My clinical findings support the need for the above services:  Unsafe ambulation due to balance issues   Further, I certify that my clinical findings support that this patient is homebound due to:  Unable to leave home safely without assistance   Home Health    Complete by:  As directed    To  provide the following care/treatments:   PT [place] work     Increase activity slowly    Complete by:  As directed      Allergies as of 05/12/2016   No Known Allergies     Medication List    STOP taking these medications   sodium polystyrene 15 GM/60ML suspension Commonly known as:  KAYEXALATE     TAKE these medications   acetaminophen 325 MG tablet Commonly known as:  TYLENOL Take 2 tablets (650 mg total) by mouth every 6 (six) hours as needed for fever. For fever > or = 101 What changed:  how much to take  Another medication with the same name was removed. Continue taking this medication, and follow the directions you see here.   allopurinol 100 MG tablet Commonly known as:  ZYLOPRIM Take 1 tablet (100 mg total) by mouth daily.   aspirin 325 MG tablet Take 325 mg by mouth daily.   bisacodyl 10 MG suppository Commonly known as:  DULCOLAX Place 1 suppository (10 mg total) rectally daily as needed for moderate constipation.   carvedilol 3.125 MG tablet Commonly known as:  COREG Take 1 tablet (3.125 mg total) by mouth 2 (two) times daily.   dextromethorphan 30 MG/5ML liquid Commonly known as:  DELSYM Take 5 mLs (30 mg total) by mouth at bedtime as needed for cough.   diclofenac sodium 1 % Gel Commonly known as:  VOLTAREN Apply 4 g topically 2 (two) times daily.  Apply to bilateral  knees   feeding supplement (GLUCERNA SHAKE) Liqd Take 237 mLs by mouth daily. Reported on 08/21/2015   feeding supplement (PRO-STAT SUGAR FREE 64) Liqd Take 30 mLs by mouth 2 (two) times daily.   FERROUS GLUCONATE PO Take 240 mg by mouth daily at 10 pm. 1 tablet   fluticasone 50 MCG/ACT nasal spray Commonly known as:  FLONASE Place 2 sprays into both nostrils daily. Reported on 05/02/2015   furosemide 40 MG tablet Commonly known as:  LASIX Take 1 tablet (40 mg total) by mouth 2 (two) times daily. Start taking on:  05/13/2016 What changed:  medication strength  how much to take  when to take this   gabapentin 100 MG capsule Commonly known as:  NEURONTIN Take 100 mg by mouth 3 (three) times daily as needed (pain). Reported on 08/22/2015   guaiFENesin 600 MG 12 hr tablet Commonly known as:  MUCINEX Take 2 tablets (1,200 mg total) by mouth 2 (two) times daily.   hydrocerin Crea Apply Eucerin cream to BLE Q day after bathing and roughly towel drying to remove loose skin   HYDROcodone-acetaminophen 5-325 MG tablet Commonly known as:  NORCO/VICODIN Take 1 tablet by mouth every 8 (eight) hours. routinely   lisinopril 2.5 MG tablet Commonly known as:  PRINIVIL,ZESTRIL Take 1 tablet (2.5 mg total) by mouth daily. Start taking on:  05/13/2016   simethicone 80 MG chewable tablet Commonly known as:  MYLICON Chew 80 mg by mouth every 6 (six) hours as needed for flatulence.   vitamin B-12 50 MCG tablet Commonly known as:  CYANOCOBALAMIN Take 100 mcg by mouth daily. 2 tablets         Diet and Activity recommendation: See Discharge Instructions above   Consults obtained -  None   Major procedures and Radiology Reports - PLEASE review detailed and final reports for all details, in brief -    Ct Abdomen Pelvis Wo Contrast  Result Date: 04/15/2016 CLINICAL DATA:  Evaluate hydronephrosis. EXAM: CT ABDOMEN AND PELVIS WITHOUT CONTRAST TECHNIQUE:  Multidetector CT imaging of the abdomen and pelvis was performed following the standard protocol without IV contrast. COMPARISON:  None. FINDINGS: Lower chest: There is subsegmental atelectasis within the lung bases. There is also a mosaic attenuation pattern within the visualized portions of the lungs. No pleural effusion noted. Hepatobiliary: No focal liver abnormality is seen. No gallstones, gallbladder wall thickening, or biliary dilatation.There is subsegmental atelectasis noted in the lung bases. Pancreas: Unremarkable. No pancreatic ductal dilatation or surrounding inflammatory changes. Spleen: The spleen is enlarged measuring 15.4 cm in length. Adrenals/Urinary Tract: Normal appearance of the adrenal glands. Cyst within the inferior pole the right kidney is incompletely characterized without IV contrast measuring 3 cm. No renal calculi identified. Very mild bilateral pelvocaliectasis identified. No evidence for high-grade obstructive uropathy. The urinary bladder is collapsed around a Foley catheter. Stomach/Bowel: The stomach is normal. The small bowel loops have a normal course and caliber. Numerous colonic diverticula identified. No evidence for acute diverticulitis. Moderate stool burden is noted within the rectum. Vascular/Lymphatic: Aortic atherosclerosis. No adenopathy identified within the abdomen or the pelvis. Reproductive: Prostate is unremarkable. Other: No free fluid or fluid collections. Musculoskeletal: The bones are diffusely sclerotic compatible with widespread osseous metastatic disease. IMPRESSION: 1. Interval decompression of bilateral renal collecting systems with mild residual pelvocaliectasis status post catheter placement. At this time there is no evidence for high-grade obstructive uropathy of either kidney. 2. Splenomegaly. 3. Diffuse sclerotic bone metastases. 4. Aortic atherosclerosis. Electronically Signed   By: Kerby Moors M.D.   On: 04/15/2016 10:25   Dg Chest 2  View  Result Date: 05/05/2016 CLINICAL DATA:  Initial evaluation for acute weakness, tenderness, fevers, chills. EXAM: CHEST  2 VIEW COMPARISON:  Prior radiograph from 04/10/2016. FINDINGS: Cardiomegaly is stable from prior. Mediastinal silhouette within normal limits. Aortic atherosclerosis noted. Lungs are hypoinflated. Prominent perihilar vascular congestion. There are scattered multifocal airspace opacities involving the perihilar and bibasilar regions, left slightly greater than right. Findings may reflect edema or possibly multifocal infection. No pleural effusion. No pneumothorax. Linear lucency overlying the upper left chest felt to reflect a skin fold. 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 No acute osseous abnormality. IMPRESSION: 1. Scattered multifocal perihilar and bibasilar airspace opacities, which may reflect pulmonary edema or possibly multifocal pneumonia (edema is favored). 2. Stable cardiomegaly with aortic atherosclerosis. 3. 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 Electronically Signed   By: Jeannine Boga M.D.   On: 05/05/2016 21:25   Dg Wrist Complete Right  Result Date: 04/15/2016 CLINICAL DATA:  Acute right wrist pain at the radial aspect of the wrist as well as the proximal second and third metacarpals for 1 day. Right arm weakness. Initial encounter. EXAM: RIGHT WRIST - COMPLETE 3+ VIEW COMPARISON:  None. FINDINGS: There is no evidence of acute fracture or dislocation. No focal osseous lesion or osseous erosion is seen. Joint space widths are preserved. No focal soft tissue abnormality is identified. IMPRESSION: No evidence of acute osseous abnormality. Electronically Signed   By: Logan Bores M.D.   On: 04/15/2016 19:25   Nm Bone Scan Whole Body  Result Date: 04/17/2016 CLINICAL DATA:  Diffuse bone pain.  History of myelofibrosis. EXAM:  NUCLEAR MEDICINE WHOLE BODY BONE SCAN TECHNIQUE: Whole body anterior and posterior images were obtained approximately 3 hours after intravenous injection of radiopharmaceutical. RADIOPHARMACEUTICALS:  25.2 mCi Technetium-7m  MDP IV COMPARISON:  Chest CT 04/01/2016.  CT abdomen and pelvis 04/14/2016. FINDINGS: There is diffusely increased radiotracer uptake throughout nearly the entirety of the axial and visualized appendicular skeleton with relative sparing of the skull. Symmetrically increased uptake is present about the hips, knees, ankles, and shoulders. The kidneys are only faintly visualized without evidence of significant radiotracer excretion into the collecting system or bladder, and this may reflect a combination of renal failure and the diffuse osseous tracer uptake. IMPRESSION: Diffusely abnormal uptake throughout the skeleton which is compatible with the patient's history of myelofibrosis. Diffuse osseous metastatic disease from a non-hematologic primary malignancy and metabolic bone diseases can also give this appearance. Electronically Signed   By: Logan Bores M.D.   On: 04/17/2016 15:12   US Renal  Result Date: 04/13/2016 CLINICAL DATA:  Acute on chronic renal failure EXAM: RENAL / URINARY TRACT ULTRASOUND COMPLETE COMPARISON:  04/03/2014 FINDINGS: Right Kidney: Length: 11.1 cm. Increased echotexture. 2.8 cm lower pole cyst. Mild right hydronephrosis. Left Kidney: Length: 12.7 cm. Increased echotexture. There is severe left hydronephrosis. 1.4 cm echogenic, shadowing area in the lower pole, likely nonobstructing left renal stone. Bladder: Multiple large cystic areas within the pelvis which appear to be contiguous with the bladder compatible with diverticula and trabeculations, stable. IMPRESSION: Mild right hydronephrosis and severe left hydronephrosis. 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 Probable 14 mm nonobstructing left lower pole renal stone.  Increased echotexture in the kidneys compatible with chronic medical renal disease. Electronically Signed   By: Rolm Baptise M.D.   On: 04/13/2016 13:01    Micro Results    Recent Results (from the past 240 hour(s))  MRSA PCR Screening     Status: None   Collection Time: 05/06/16  6:56 AM  Result Value Ref Range Status   MRSA by PCR NEGATIVE NEGATIVE Final    Comment:        The GeneXpert MRSA Assay (FDA approved for NASAL specimens only), is one component of a comprehensive MRSA colonization surveillance program. It is not intended to diagnose MRSA infection nor to guide or monitor treatment for MRSA infections.        Today   Subjective:   Jacon Whetzel today has no headache,no chest or abdominal pain, poor generalized weakness, feels much better  today.   Objective:   Blood pressure (!) 142/68, pulse 92, temperature 98.4 F (36.9 C), temperature source Oral, resp. rate 20, height '6\' 3"'$  (1.905 m), weight 89.8 kg (198 lb), SpO2 91 %.   Intake/Output Summary (Last 24 hours) at 05/12/16 1626 Last data filed at 05/12/16 1230  Gross per 24 hour  Intake             1717 ml  Output             2050 ml  Net             -333 ml    Exam  Awake Alert, Oriented X 3,  Supple Neck,No JVD,  Symmetrical Chest wall movement, Good air movement bilaterally, CTAB RRR,No Gallops,Rubs or new Murmurs, No Parasternal Heave +ve B.Sounds, Abd Soft, No tenderness, , No rebound - guarding or rigidity. No Cyanosis, Clubbing or edema, No new Rash or bruise , no  edema.  Data Review   CBC w Diff:  Lab Results  Component Value Date   WBC 13.4 (H) 05/07/2016   HGB 8.9 (L) 05/07/2016   HGB 9.4 (L) 10/22/2014   HGB 9.8 (L)  11/16/2013   HCT 28.9 (L) 05/07/2016   HCT 29.7 (L) 10/22/2014   HCT 30.7 (L) 11/16/2013   PLT 516 (H) 05/07/2016   PLT 290 10/22/2014   PLT 323 11/16/2013   LYMPHOPCT 22 04/15/2016   BANDSPCT 0 04/15/2016   MONOPCT 6 04/15/2016   EOSPCT 3 04/15/2016    EOSPCT 4 11/16/2013   BASOPCT 2 04/15/2016    CMP:  Lab Results  Component Value Date   NA 139 05/11/2016   NA 141 04/24/2016   NA 143 07/30/2014   NA 138 11/16/2013   K 4.1 05/11/2016   K 5.1 (H) 07/30/2014   K 4.5 11/16/2013   CL 103 05/11/2016   CL 105 07/30/2014   CO2 30 05/11/2016   CO2 28 07/30/2014   CO2 22 11/16/2013   BUN 30 (H) 05/11/2016   BUN 64 (A) 04/24/2016   BUN 21 07/30/2014   BUN 20.2 11/16/2013   CREATININE 0.91 05/11/2016   CREATININE 1.3 (H) 07/30/2014   CREATININE 1.2 11/16/2013   GLU 92 04/24/2016   PROT 5.7 (L) 05/05/2016   PROT 7.0 07/30/2014   PROT 6.7 11/16/2013   ALBUMIN 2.5 (L) 05/05/2016   ALBUMIN 3.2 (L) 07/30/2014   ALBUMIN 3.4 (L) 11/16/2013   BILITOT 0.2 (L) 05/05/2016   BILITOT 0.50 07/30/2014   BILITOT 0.35 11/16/2013   ALKPHOS 160 (H) 05/05/2016   ALKPHOS 88 (H) 07/30/2014   ALKPHOS 125 11/16/2013   AST 24 05/05/2016   AST 30 07/30/2014   AST 25 11/16/2013   ALT 16 (L) 05/05/2016   ALT 10 07/30/2014   ALT 11 11/16/2013  .   Total Time in preparing paper work, data evaluation and todays exam - 35 minutes  Adryan Druckenmiller M.D on 05/12/2016 at 4:26 PM  [facility]   Office  [phone]

## 2016-05-12 NOTE — Clinical Social Work Note (Signed)
At this time Pt has chosen to go to [facility]. CSW has attempted several times to reach out to facility with no answer. CSW will continue to try to reach someone at [facility].   [address], [place]

## 2016-05-12 NOTE — Care Management Important Message (Signed)
Important Message  Patient Details  Name: Juan [name] MRN: [id number] Date of Birth: February 20, 1941   Medicare Important Message Given:  Yes    Zorina Mallin [name] 05/12/2016, 12:47 PM

## 2016-05-13 DIAGNOSIS — D471 Chronic myeloproliferative disease: Secondary | ICD-10-CM | POA: Diagnosis not present

## 2016-05-13 DIAGNOSIS — I5032 Chronic diastolic (congestive) heart failure: Secondary | ICD-10-CM | POA: Diagnosis not present

## 2016-05-13 DIAGNOSIS — I2510 Atherosclerotic heart disease of native coronary artery without angina pectoris: Secondary | ICD-10-CM | POA: Diagnosis not present

## 2016-05-13 DIAGNOSIS — E119 Type 2 diabetes mellitus without complications: Secondary | ICD-10-CM | POA: Diagnosis not present

## 2016-05-17 ENCOUNTER — Encounter: Payer: Self-pay | Admitting: Internal Medicine

## 2016-05-19 ENCOUNTER — Inpatient Hospital Stay: Payer: Self-pay | Admitting: Internal Medicine

## 2016-05-19 DIAGNOSIS — R339 Retention of urine, unspecified: Secondary | ICD-10-CM | POA: Diagnosis not present

## 2016-05-20 DIAGNOSIS — H04123 Dry eye syndrome of bilateral lacrimal glands: Secondary | ICD-10-CM | POA: Diagnosis not present

## 2016-05-20 DIAGNOSIS — I5032 Chronic diastolic (congestive) heart failure: Secondary | ICD-10-CM | POA: Diagnosis not present

## 2016-05-20 DIAGNOSIS — E119 Type 2 diabetes mellitus without complications: Secondary | ICD-10-CM | POA: Diagnosis not present

## 2016-05-20 DIAGNOSIS — I2510 Atherosclerotic heart disease of native coronary artery without angina pectoris: Secondary | ICD-10-CM | POA: Diagnosis not present

## 2016-05-29 DIAGNOSIS — E119 Type 2 diabetes mellitus without complications: Secondary | ICD-10-CM | POA: Diagnosis not present

## 2016-05-29 DIAGNOSIS — I5032 Chronic diastolic (congestive) heart failure: Secondary | ICD-10-CM | POA: Diagnosis not present

## 2016-05-29 DIAGNOSIS — R5381 Other malaise: Secondary | ICD-10-CM | POA: Diagnosis not present

## 2016-05-29 DIAGNOSIS — I2510 Atherosclerotic heart disease of native coronary artery without angina pectoris: Secondary | ICD-10-CM | POA: Diagnosis not present

## 2016-06-04 ENCOUNTER — Other Ambulatory Visit (HOSPITAL_COMMUNITY): Payer: Self-pay | Admitting: Adult Health

## 2016-06-04 ENCOUNTER — Telehealth: Payer: Self-pay | Admitting: Physician Assistant

## 2016-06-04 MED FILL — ISOSORBIDE MN ER 30 MG TAB: 30 | 30 days supply | Qty: 60 | Fill #1

## 2016-06-04 MED FILL — CARVEDILOL 6.25 MG TABLET: 6.25 | 30 days supply | Qty: 60 | Fill #1

## 2016-06-04 MED FILL — TORSEMIDE 20 MG TABLET: 20 | 30 days supply | Qty: 120 | Fill #2

## 2016-06-04 NOTE — Telephone Encounter (Signed)
Patient called answering service. States that his children recently threw away his medication that he was prescribed from the hospital. He is not sure what the names of them were. He states they are the 4 that were prescribed recently. Per chart review, the most recent prescriptions sent in are Delsym, lisinopril, Lasix, and Norco. I informed him that I would be able to send in the first 3 but he should be in contact with his primary care provider if he needs a refill of the opiate medication. He then asked me to call a taxi to come pick up his son. I advised him that I am not familiar with any local taxi companies and offered to look several numbers up online but he declined. He then asked me what the number was to the [facility] OP pharmacy was - I provided him with this number. I asked him where I should send in the medications he requested at which time he insisted there were 4 that I needed to send in. I reminded him that I would be able to send in the 3, but not the narcotic prescription. He then told me that he would instead call the office when it re-opens. I again offered to refill the medications he requested but he declined.  Dayna Dunn PA-C

## 2016-06-05 DIAGNOSIS — M6281 Muscle weakness (generalized): Secondary | ICD-10-CM | POA: Diagnosis not present

## 2016-06-05 DIAGNOSIS — N183 Chronic kidney disease, stage 3 (moderate): Secondary | ICD-10-CM | POA: Diagnosis not present

## 2016-06-05 DIAGNOSIS — I130 Hypertensive heart and chronic kidney disease with heart failure and stage 1 through stage 4 chronic kidney disease, or unspecified chronic kidney disease: Secondary | ICD-10-CM | POA: Diagnosis not present

## 2016-06-05 DIAGNOSIS — R339 Retention of urine, unspecified: Secondary | ICD-10-CM | POA: Diagnosis not present

## 2016-06-05 DIAGNOSIS — E1122 Type 2 diabetes mellitus with diabetic chronic kidney disease: Secondary | ICD-10-CM | POA: Diagnosis not present

## 2016-06-05 DIAGNOSIS — R531 Weakness: Secondary | ICD-10-CM | POA: Diagnosis not present

## 2016-06-05 DIAGNOSIS — T83091A Other mechanical complication of indwelling urethral catheter, initial encounter: Secondary | ICD-10-CM | POA: Diagnosis not present

## 2016-06-05 DIAGNOSIS — I5032 Chronic diastolic (congestive) heart failure: Secondary | ICD-10-CM | POA: Diagnosis not present

## 2016-06-05 DIAGNOSIS — Z87440 Personal history of urinary (tract) infections: Secondary | ICD-10-CM | POA: Diagnosis not present

## 2016-06-08 ENCOUNTER — Telehealth: Payer: Self-pay | Admitting: Internal Medicine

## 2016-06-08 DIAGNOSIS — E1122 Type 2 diabetes mellitus with diabetic chronic kidney disease: Secondary | ICD-10-CM | POA: Diagnosis not present

## 2016-06-08 DIAGNOSIS — I5032 Chronic diastolic (congestive) heart failure: Secondary | ICD-10-CM | POA: Diagnosis not present

## 2016-06-08 DIAGNOSIS — M6281 Muscle weakness (generalized): Secondary | ICD-10-CM | POA: Diagnosis not present

## 2016-06-08 DIAGNOSIS — N183 Chronic kidney disease, stage 3 (moderate): Secondary | ICD-10-CM | POA: Diagnosis not present

## 2016-06-08 DIAGNOSIS — I130 Hypertensive heart and chronic kidney disease with heart failure and stage 1 through stage 4 chronic kidney disease, or unspecified chronic kidney disease: Secondary | ICD-10-CM | POA: Diagnosis not present

## 2016-06-08 DIAGNOSIS — Z87440 Personal history of urinary (tract) infections: Secondary | ICD-10-CM | POA: Diagnosis not present

## 2016-06-08 NOTE — Telephone Encounter (Signed)
Okay to give verbal? 

## 2016-06-08 NOTE — Telephone Encounter (Signed)
Caller name:Fran Relation to FS:ELTRVUY of [place] Call back number:[phone] Pharmacy:  Reason for call: Pt is now requesting to re enroll in pallative care, requesting verbal order or fax order to [phone]

## 2016-06-08 NOTE — Telephone Encounter (Signed)
Yes , is okay. Please ask where is the pt located.  At home? SNF?

## 2016-06-09 NOTE — Telephone Encounter (Signed)
Thank you :)

## 2016-06-09 NOTE — Telephone Encounter (Signed)
Spoke w/ Manus Gunning, verbal order given to restart palliative care. Pt is currently at home.

## 2016-06-10 DIAGNOSIS — I5032 Chronic diastolic (congestive) heart failure: Secondary | ICD-10-CM | POA: Diagnosis not present

## 2016-06-10 DIAGNOSIS — Z87440 Personal history of urinary (tract) infections: Secondary | ICD-10-CM | POA: Diagnosis not present

## 2016-06-10 DIAGNOSIS — N183 Chronic kidney disease, stage 3 (moderate): Secondary | ICD-10-CM | POA: Diagnosis not present

## 2016-06-10 DIAGNOSIS — E1122 Type 2 diabetes mellitus with diabetic chronic kidney disease: Secondary | ICD-10-CM | POA: Diagnosis not present

## 2016-06-10 DIAGNOSIS — M6281 Muscle weakness (generalized): Secondary | ICD-10-CM | POA: Diagnosis not present

## 2016-06-10 DIAGNOSIS — I130 Hypertensive heart and chronic kidney disease with heart failure and stage 1 through stage 4 chronic kidney disease, or unspecified chronic kidney disease: Secondary | ICD-10-CM | POA: Diagnosis not present

## 2016-06-12 DIAGNOSIS — N183 Chronic kidney disease, stage 3 (moderate): Secondary | ICD-10-CM | POA: Diagnosis not present

## 2016-06-12 DIAGNOSIS — E1122 Type 2 diabetes mellitus with diabetic chronic kidney disease: Secondary | ICD-10-CM | POA: Diagnosis not present

## 2016-06-12 DIAGNOSIS — Z87440 Personal history of urinary (tract) infections: Secondary | ICD-10-CM | POA: Diagnosis not present

## 2016-06-12 DIAGNOSIS — M6281 Muscle weakness (generalized): Secondary | ICD-10-CM | POA: Diagnosis not present

## 2016-06-12 DIAGNOSIS — I5032 Chronic diastolic (congestive) heart failure: Secondary | ICD-10-CM | POA: Diagnosis not present

## 2016-06-12 DIAGNOSIS — I130 Hypertensive heart and chronic kidney disease with heart failure and stage 1 through stage 4 chronic kidney disease, or unspecified chronic kidney disease: Secondary | ICD-10-CM | POA: Diagnosis not present

## 2016-06-15 DIAGNOSIS — M6281 Muscle weakness (generalized): Secondary | ICD-10-CM | POA: Diagnosis not present

## 2016-06-15 DIAGNOSIS — N183 Chronic kidney disease, stage 3 (moderate): Secondary | ICD-10-CM | POA: Diagnosis not present

## 2016-06-15 DIAGNOSIS — Z87440 Personal history of urinary (tract) infections: Secondary | ICD-10-CM | POA: Diagnosis not present

## 2016-06-15 DIAGNOSIS — I5032 Chronic diastolic (congestive) heart failure: Secondary | ICD-10-CM | POA: Diagnosis not present

## 2016-06-15 DIAGNOSIS — I130 Hypertensive heart and chronic kidney disease with heart failure and stage 1 through stage 4 chronic kidney disease, or unspecified chronic kidney disease: Secondary | ICD-10-CM | POA: Diagnosis not present

## 2016-06-15 DIAGNOSIS — E1122 Type 2 diabetes mellitus with diabetic chronic kidney disease: Secondary | ICD-10-CM | POA: Diagnosis not present

## 2016-06-17 DIAGNOSIS — N183 Chronic kidney disease, stage 3 (moderate): Secondary | ICD-10-CM | POA: Diagnosis not present

## 2016-06-17 DIAGNOSIS — I5032 Chronic diastolic (congestive) heart failure: Secondary | ICD-10-CM | POA: Diagnosis not present

## 2016-06-17 DIAGNOSIS — E1122 Type 2 diabetes mellitus with diabetic chronic kidney disease: Secondary | ICD-10-CM | POA: Diagnosis not present

## 2016-06-17 DIAGNOSIS — I130 Hypertensive heart and chronic kidney disease with heart failure and stage 1 through stage 4 chronic kidney disease, or unspecified chronic kidney disease: Secondary | ICD-10-CM | POA: Diagnosis not present

## 2016-06-17 DIAGNOSIS — Z87440 Personal history of urinary (tract) infections: Secondary | ICD-10-CM | POA: Diagnosis not present

## 2016-06-17 DIAGNOSIS — M6281 Muscle weakness (generalized): Secondary | ICD-10-CM | POA: Diagnosis not present

## 2016-06-18 DIAGNOSIS — I130 Hypertensive heart and chronic kidney disease with heart failure and stage 1 through stage 4 chronic kidney disease, or unspecified chronic kidney disease: Secondary | ICD-10-CM | POA: Diagnosis not present

## 2016-06-18 DIAGNOSIS — Z87440 Personal history of urinary (tract) infections: Secondary | ICD-10-CM | POA: Diagnosis not present

## 2016-06-18 DIAGNOSIS — E1122 Type 2 diabetes mellitus with diabetic chronic kidney disease: Secondary | ICD-10-CM | POA: Diagnosis not present

## 2016-06-18 DIAGNOSIS — I5032 Chronic diastolic (congestive) heart failure: Secondary | ICD-10-CM | POA: Diagnosis not present

## 2016-06-18 DIAGNOSIS — N183 Chronic kidney disease, stage 3 (moderate): Secondary | ICD-10-CM | POA: Diagnosis not present

## 2016-06-18 DIAGNOSIS — M6281 Muscle weakness (generalized): Secondary | ICD-10-CM | POA: Diagnosis not present

## 2016-06-22 ENCOUNTER — Other Ambulatory Visit: Payer: Self-pay | Admitting: *Deleted

## 2016-06-22 ENCOUNTER — Telehealth: Payer: Self-pay | Admitting: Internal Medicine

## 2016-06-22 DIAGNOSIS — M6281 Muscle weakness (generalized): Secondary | ICD-10-CM | POA: Diagnosis not present

## 2016-06-22 DIAGNOSIS — N183 Chronic kidney disease, stage 3 (moderate): Secondary | ICD-10-CM | POA: Diagnosis not present

## 2016-06-22 DIAGNOSIS — E1122 Type 2 diabetes mellitus with diabetic chronic kidney disease: Secondary | ICD-10-CM | POA: Diagnosis not present

## 2016-06-22 DIAGNOSIS — I5032 Chronic diastolic (congestive) heart failure: Secondary | ICD-10-CM | POA: Diagnosis not present

## 2016-06-22 DIAGNOSIS — D649 Anemia, unspecified: Secondary | ICD-10-CM

## 2016-06-22 DIAGNOSIS — I130 Hypertensive heart and chronic kidney disease with heart failure and stage 1 through stage 4 chronic kidney disease, or unspecified chronic kidney disease: Secondary | ICD-10-CM | POA: Diagnosis not present

## 2016-06-22 DIAGNOSIS — Z87440 Personal history of urinary (tract) infections: Secondary | ICD-10-CM | POA: Diagnosis not present

## 2016-06-22 NOTE — Telephone Encounter (Signed)
Attempted to reach patient and schedule medicare wellness appointment VM was not setup, noticed patient hospital follow was no show will reach out to patient again regarding f/u and AWV.

## 2016-06-23 ENCOUNTER — Ambulatory Visit: Payer: Medicare Other | Admitting: Hematology & Oncology

## 2016-06-23 ENCOUNTER — Other Ambulatory Visit: Payer: Medicare Other

## 2016-06-23 DIAGNOSIS — E1122 Type 2 diabetes mellitus with diabetic chronic kidney disease: Secondary | ICD-10-CM | POA: Diagnosis not present

## 2016-06-23 DIAGNOSIS — I130 Hypertensive heart and chronic kidney disease with heart failure and stage 1 through stage 4 chronic kidney disease, or unspecified chronic kidney disease: Secondary | ICD-10-CM | POA: Diagnosis not present

## 2016-06-23 DIAGNOSIS — Z87440 Personal history of urinary (tract) infections: Secondary | ICD-10-CM | POA: Diagnosis not present

## 2016-06-23 DIAGNOSIS — M6281 Muscle weakness (generalized): Secondary | ICD-10-CM | POA: Diagnosis not present

## 2016-06-23 DIAGNOSIS — I5032 Chronic diastolic (congestive) heart failure: Secondary | ICD-10-CM | POA: Diagnosis not present

## 2016-06-23 DIAGNOSIS — N183 Chronic kidney disease, stage 3 (moderate): Secondary | ICD-10-CM | POA: Diagnosis not present

## 2016-06-25 DIAGNOSIS — I130 Hypertensive heart and chronic kidney disease with heart failure and stage 1 through stage 4 chronic kidney disease, or unspecified chronic kidney disease: Secondary | ICD-10-CM | POA: Diagnosis not present

## 2016-06-25 DIAGNOSIS — N183 Chronic kidney disease, stage 3 (moderate): Secondary | ICD-10-CM | POA: Diagnosis not present

## 2016-06-25 DIAGNOSIS — M6281 Muscle weakness (generalized): Secondary | ICD-10-CM | POA: Diagnosis not present

## 2016-06-25 DIAGNOSIS — I5032 Chronic diastolic (congestive) heart failure: Secondary | ICD-10-CM | POA: Diagnosis not present

## 2016-06-25 DIAGNOSIS — E1122 Type 2 diabetes mellitus with diabetic chronic kidney disease: Secondary | ICD-10-CM | POA: Diagnosis not present

## 2016-06-25 DIAGNOSIS — Z87440 Personal history of urinary (tract) infections: Secondary | ICD-10-CM | POA: Diagnosis not present

## 2016-06-29 DIAGNOSIS — I5032 Chronic diastolic (congestive) heart failure: Secondary | ICD-10-CM | POA: Diagnosis not present

## 2016-06-29 DIAGNOSIS — Z87440 Personal history of urinary (tract) infections: Secondary | ICD-10-CM | POA: Diagnosis not present

## 2016-06-29 DIAGNOSIS — E1122 Type 2 diabetes mellitus with diabetic chronic kidney disease: Secondary | ICD-10-CM | POA: Diagnosis not present

## 2016-06-29 DIAGNOSIS — I130 Hypertensive heart and chronic kidney disease with heart failure and stage 1 through stage 4 chronic kidney disease, or unspecified chronic kidney disease: Secondary | ICD-10-CM | POA: Diagnosis not present

## 2016-06-29 DIAGNOSIS — M6281 Muscle weakness (generalized): Secondary | ICD-10-CM | POA: Diagnosis not present

## 2016-06-29 DIAGNOSIS — N183 Chronic kidney disease, stage 3 (moderate): Secondary | ICD-10-CM | POA: Diagnosis not present

## 2016-06-30 DIAGNOSIS — I130 Hypertensive heart and chronic kidney disease with heart failure and stage 1 through stage 4 chronic kidney disease, or unspecified chronic kidney disease: Secondary | ICD-10-CM | POA: Diagnosis not present

## 2016-06-30 DIAGNOSIS — N183 Chronic kidney disease, stage 3 (moderate): Secondary | ICD-10-CM | POA: Diagnosis not present

## 2016-06-30 DIAGNOSIS — E1122 Type 2 diabetes mellitus with diabetic chronic kidney disease: Secondary | ICD-10-CM | POA: Diagnosis not present

## 2016-06-30 DIAGNOSIS — I5032 Chronic diastolic (congestive) heart failure: Secondary | ICD-10-CM | POA: Diagnosis not present

## 2016-06-30 DIAGNOSIS — M6281 Muscle weakness (generalized): Secondary | ICD-10-CM | POA: Diagnosis not present

## 2016-06-30 DIAGNOSIS — Z87440 Personal history of urinary (tract) infections: Secondary | ICD-10-CM | POA: Diagnosis not present

## 2016-07-01 DIAGNOSIS — Z87440 Personal history of urinary (tract) infections: Secondary | ICD-10-CM | POA: Diagnosis not present

## 2016-07-01 DIAGNOSIS — N183 Chronic kidney disease, stage 3 (moderate): Secondary | ICD-10-CM | POA: Diagnosis not present

## 2016-07-01 DIAGNOSIS — E1122 Type 2 diabetes mellitus with diabetic chronic kidney disease: Secondary | ICD-10-CM | POA: Diagnosis not present

## 2016-07-01 DIAGNOSIS — I5032 Chronic diastolic (congestive) heart failure: Secondary | ICD-10-CM | POA: Diagnosis not present

## 2016-07-01 DIAGNOSIS — M6281 Muscle weakness (generalized): Secondary | ICD-10-CM | POA: Diagnosis not present

## 2016-07-01 DIAGNOSIS — I130 Hypertensive heart and chronic kidney disease with heart failure and stage 1 through stage 4 chronic kidney disease, or unspecified chronic kidney disease: Secondary | ICD-10-CM | POA: Diagnosis not present

## 2016-07-02 DIAGNOSIS — N183 Chronic kidney disease, stage 3 (moderate): Secondary | ICD-10-CM | POA: Diagnosis not present

## 2016-07-02 DIAGNOSIS — Z87440 Personal history of urinary (tract) infections: Secondary | ICD-10-CM | POA: Diagnosis not present

## 2016-07-02 DIAGNOSIS — E1122 Type 2 diabetes mellitus with diabetic chronic kidney disease: Secondary | ICD-10-CM | POA: Diagnosis not present

## 2016-07-02 DIAGNOSIS — I130 Hypertensive heart and chronic kidney disease with heart failure and stage 1 through stage 4 chronic kidney disease, or unspecified chronic kidney disease: Secondary | ICD-10-CM | POA: Diagnosis not present

## 2016-07-02 DIAGNOSIS — I5032 Chronic diastolic (congestive) heart failure: Secondary | ICD-10-CM | POA: Diagnosis not present

## 2016-07-02 DIAGNOSIS — M6281 Muscle weakness (generalized): Secondary | ICD-10-CM | POA: Diagnosis not present

## 2016-07-03 DIAGNOSIS — Z87440 Personal history of urinary (tract) infections: Secondary | ICD-10-CM | POA: Diagnosis not present

## 2016-07-03 DIAGNOSIS — I130 Hypertensive heart and chronic kidney disease with heart failure and stage 1 through stage 4 chronic kidney disease, or unspecified chronic kidney disease: Secondary | ICD-10-CM | POA: Diagnosis not present

## 2016-07-03 DIAGNOSIS — M6281 Muscle weakness (generalized): Secondary | ICD-10-CM | POA: Diagnosis not present

## 2016-07-03 DIAGNOSIS — N183 Chronic kidney disease, stage 3 (moderate): Secondary | ICD-10-CM | POA: Diagnosis not present

## 2016-07-03 DIAGNOSIS — I5032 Chronic diastolic (congestive) heart failure: Secondary | ICD-10-CM | POA: Diagnosis not present

## 2016-07-03 DIAGNOSIS — E1122 Type 2 diabetes mellitus with diabetic chronic kidney disease: Secondary | ICD-10-CM | POA: Diagnosis not present

## 2016-07-07 DIAGNOSIS — I130 Hypertensive heart and chronic kidney disease with heart failure and stage 1 through stage 4 chronic kidney disease, or unspecified chronic kidney disease: Secondary | ICD-10-CM | POA: Diagnosis not present

## 2016-07-07 DIAGNOSIS — Z87440 Personal history of urinary (tract) infections: Secondary | ICD-10-CM | POA: Diagnosis not present

## 2016-07-07 DIAGNOSIS — N183 Chronic kidney disease, stage 3 (moderate): Secondary | ICD-10-CM | POA: Diagnosis not present

## 2016-07-07 DIAGNOSIS — I5032 Chronic diastolic (congestive) heart failure: Secondary | ICD-10-CM | POA: Diagnosis not present

## 2016-07-07 DIAGNOSIS — E1122 Type 2 diabetes mellitus with diabetic chronic kidney disease: Secondary | ICD-10-CM | POA: Diagnosis not present

## 2016-07-07 DIAGNOSIS — M6281 Muscle weakness (generalized): Secondary | ICD-10-CM | POA: Diagnosis not present

## 2016-07-08 DIAGNOSIS — I5032 Chronic diastolic (congestive) heart failure: Secondary | ICD-10-CM | POA: Diagnosis not present

## 2016-07-08 DIAGNOSIS — Z87440 Personal history of urinary (tract) infections: Secondary | ICD-10-CM | POA: Diagnosis not present

## 2016-07-08 DIAGNOSIS — N183 Chronic kidney disease, stage 3 (moderate): Secondary | ICD-10-CM | POA: Diagnosis not present

## 2016-07-08 DIAGNOSIS — I130 Hypertensive heart and chronic kidney disease with heart failure and stage 1 through stage 4 chronic kidney disease, or unspecified chronic kidney disease: Secondary | ICD-10-CM | POA: Diagnosis not present

## 2016-07-08 DIAGNOSIS — M6281 Muscle weakness (generalized): Secondary | ICD-10-CM | POA: Diagnosis not present

## 2016-07-08 DIAGNOSIS — E1122 Type 2 diabetes mellitus with diabetic chronic kidney disease: Secondary | ICD-10-CM | POA: Diagnosis not present

## 2016-07-14 MED FILL — TORSEMIDE 20 MG TABLET: 20 | 30 days supply | Qty: 120 | Fill #3

## 2016-07-14 MED FILL — ISOSORBIDE MN ER 30 MG TAB: 30 | 30 days supply | Qty: 60 | Fill #2

## 2016-07-14 MED FILL — CARVEDILOL 6.25 MG TABLET: 6.25 | 30 days supply | Qty: 60 | Fill #2

## 2016-07-28 ENCOUNTER — Other Ambulatory Visit: Payer: Medicare Other

## 2016-07-28 ENCOUNTER — Ambulatory Visit (INDEPENDENT_AMBULATORY_CARE_PROVIDER_SITE_OTHER): Payer: Medicare Other | Admitting: Internal Medicine

## 2016-07-28 ENCOUNTER — Encounter: Payer: Self-pay | Admitting: Internal Medicine

## 2016-07-28 VITALS — BP 124/68 | HR 88 | Temp 98.1°F | Resp 14 | Ht 75.0 in | Wt 185.1 lb

## 2016-07-28 DIAGNOSIS — I5023 Acute on chronic systolic (congestive) heart failure: Secondary | ICD-10-CM | POA: Diagnosis not present

## 2016-07-28 DIAGNOSIS — E1142 Type 2 diabetes mellitus with diabetic polyneuropathy: Secondary | ICD-10-CM

## 2016-07-28 DIAGNOSIS — R7309 Other abnormal glucose: Secondary | ICD-10-CM | POA: Diagnosis not present

## 2016-07-28 DIAGNOSIS — N400 Enlarged prostate without lower urinary tract symptoms: Secondary | ICD-10-CM | POA: Diagnosis not present

## 2016-07-28 DIAGNOSIS — Z794 Long term (current) use of insulin: Secondary | ICD-10-CM | POA: Diagnosis not present

## 2016-07-28 LAB — BASIC METABOLIC PANEL
BUN: 69 mg/dL — AB (ref 6–23)
CALCIUM: 10.4 mg/dL (ref 8.4–10.5)
CHLORIDE: 105 meq/L (ref 96–112)
CO2: 29 mEq/L (ref 19–32)
CREATININE: 1.29 mg/dL (ref 0.40–1.50)
GFR: 69.89 mL/min (ref >60.00)
Glucose, Bld: 93 mg/dL (ref 70–99)
Potassium: 4.6 mEq/L (ref 3.5–5.1)
Sodium: 142 mEq/L (ref 135–145)

## 2016-07-28 LAB — HEMOGLOBIN A1C: HEMOGLOBIN A1C: 5.8 % (ref 4.6–6.5)

## 2016-07-28 NOTE — Progress Notes (Signed)
Subjective:    Patient ID: Juan [name], male    DOB: May 27, 1940, 76 y.o.   MRN: [id number]  DOS:  07/28/2016 Type of visit - description : Hospital follow-up Interval history: After an admission for pneumonia, CHF, urinary retention acute on chronic, indwelling catheter he was readmitted December 19 for one week due to feeling weak, dyspnea, worsening edema. -He was treated for acute on chronic diastolic heart failure, s/p  IV diuresis, transitioned to oral Lasix. -For generalized weakness: PT consulted, he was discharged to a SNF -Had BPH and bladder outlet obstruction -He was discharged to a SNF and sent back home 06/02/2016.  Review of Systems  At this point states is taking all his medications. No fevers, appetite is "really good". Edema has not resurface, shortness of breath is at baseline. Denies nausea or vomiting No cough Has no bladder catheter and flow seems okay.   Past Medical History:  Diagnosis Date  . Allergic rhinitis   . Anemia   . Ascending aortic aneurysm ([place]) 03/2014   4.3cm on CT scan  . CAD (coronary artery disease)    dx elsewheer in past, no documentation. Non-ischemic myoview 2007  . Chronic diastolic CHF (congestive heart failure), NYHA class 2 (HCC)    Normal EF w/ grade 1 dd by echo 12/2015  . Edema    R>L leg, u/s 5-12 neg for DVT  . Hemorrhoid   . History of thrombocytosis   . Hypertension   . Migraine    "once/wk at least" (07/11/2013)  . Myeloproliferative disease ([place])   . Shortness of breath   . Sinus congestion   . Sleep apnea, obstructive    at some point used CPAP, was d/c  years ago  . Type II diabetes mellitus ([place])     Past Surgical History:  Procedure Laterality Date  . TOE SURGERY Right    "tried to straighten out big toe" (07/11/2013)    Social History   Social History  . Marital status: Widowed    Spouse name: N/A  . Number of children: 2  . Years of education: N/A   Occupational History  . retired    .  Retired    Social History Main Topics  . Smoking status: Former Smoker    Packs/day: 0.25    Years: 12.00    Types: Cigarettes    Quit date: 05/18/1966  . Smokeless tobacco: Never Used     Comment: quit smoking 45 years ago  . Alcohol use No  . Drug use: No  . Sexual activity: Yes   Other Topics Concern  . Not on file   Social History Narrative   Moved from [place] 2006   Widow 2007   Son lives with him       Allergies as of 07/28/2016   No Known Allergies     Medication List       Accurate as of 07/28/16  1:20 PM. Always use your most recent med list.          acetaminophen 325 MG tablet Commonly known as:  TYLENOL Take 2 tablets (650 mg total) by mouth every 6 (six) hours as needed for fever. For fever > or = 101   allopurinol 100 MG tablet Commonly known as:  ZYLOPRIM Take 1 tablet (100 mg total) by mouth daily.   aspirin 325 MG tablet Take 325 mg by mouth daily.   bisacodyl 10 MG suppository Commonly known as:  DULCOLAX Place 1 suppository (10 mg  total) rectally daily as needed for moderate constipation.   carvedilol 3.125 MG tablet Commonly known as:  COREG Take 1 tablet (3.125 mg total) by mouth 2 (two) times daily.   dextromethorphan 30 MG/5ML liquid Commonly known as:  DELSYM Take 5 mLs (30 mg total) by mouth at bedtime as needed for cough.   diclofenac sodium 1 % Gel Commonly known as:  VOLTAREN Apply 4 g topically 2 (two) times daily. Apply to bilateral  knees   feeding supplement (GLUCERNA SHAKE) Liqd Take 237 mLs by mouth daily. Reported on 08/21/2015   feeding supplement (PRO-STAT SUGAR FREE 64) Liqd Take 30 mLs by mouth 2 (two) times daily.   FERROUS GLUCONATE PO Take 240 mg by mouth daily at 10 pm. 1 tablet   fluticasone 50 MCG/ACT nasal spray Commonly known as:  FLONASE Place 2 sprays into both nostrils daily. Reported on 05/02/2015   furosemide 40 MG tablet Commonly known as:  LASIX Take 1 tablet (40 mg total) by mouth 2 (two) times daily.     gabapentin 100 MG capsule Commonly known as:  NEURONTIN Take 100 mg by mouth 3 (three) times daily as needed (pain). Reported on 08/22/2015   guaiFENesin 600 MG 12 hr tablet Commonly known as:  MUCINEX Take 2 tablets (1,200 mg total) by mouth 2 (two) times daily.   hydrocerin Crea Apply Eucerin cream to BLE Q day after bathing and roughly towel drying to remove loose skin   HYDROcodone-acetaminophen 5-325 MG tablet Commonly known as:  NORCO/VICODIN Take 1 tablet by mouth every 8 (eight) hours. routinely   lisinopril 2.5 MG tablet Commonly known as:  PRINIVIL,ZESTRIL Take 1 tablet (2.5 mg total) by mouth daily.   simethicone 80 MG chewable tablet Commonly known as:  MYLICON Chew 80 mg by mouth every 6 (six) hours as needed for flatulence.   vitamin B-12 50 MCG tablet Commonly known as:  CYANOCOBALAMIN Take 100 mcg by mouth daily. 2 tablets          Objective:   Physical Exam BP 124/68 (BP Location: Left Arm, Patient Position: Sitting, Cuff Size: Normal)   Pulse 88   Temp 98.1 F (36.7 C) (Oral)   Resp 14   Ht '6\' 3"'$  (1.905 m)   Wt 185 lb 2 oz (84 kg)   SpO2 92%   BMI 23.14 kg/m  General:   Well developed, well nourished . NAD.  HEENT:  Normocephalic . Face symmetric, atraumatic Lungs:  CTA B Normal respiratory effort, no intercostal retractions, no accessory muscle use. Heart: RRR,  no murmur.  No pretibial edema bilaterally  Skin: Not pale. Not jaundice Neurologic:  alert & oriented X3.  Speech normal, gait is slow, he uses a wheelchair for support but able to walk by himself. Psych--  No anxious or depressed appearing.      Assessment & Plan:    Assessment Prediabetes  Neuropathy, on Neurontin prn and hydrocodone prn HTN CKD, creatinine ~ 1.7-1.9 Chronic edema and stasis dermatitis Morbid obesity Poor compliance with advice: Saw psychiatry 04/07/2014 at the hospital and deemed to be competent to make his own decisions. 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 They recommend continuing Foley catheter, change every 4 weeks " OSA, used a CPAP at some point  PLAN: S/p  admission for CHF, transferred to a SNF 05/12/2016, d/c home 06/02/2016. Was f/u  by a [place] agency up until 2 weeks ago. Med compliance: Patient reports that he is taking "all medications", stated  his son [name] helps w/ medication intake. I don't know how reliable this  Info is, for now will say he takes his medications; when we call w/ lab results, my nurse will talk w/Jason and clarify the issue. CHF: Seems to be doing very well. Continue lisinopril, Lasix, carvedilol. Check a BMP and CBC.Has an appointment to see cardiology this week. Prediabetes: Check A1c BPH, bladder outlet obstruction: Currently without a Foley, reports good flow. Needs close f/u--- RTC 2 months.

## 2016-07-28 NOTE — Assessment & Plan Note (Signed)
S/p  admission for CHF, transferred to a SNF 05/12/2016, d/c home 06/02/2016. Was f/u  by a [facility] agency up until 2 weeks ago. Med compliance: Patient reports that he is taking "all medications", stated  his son [name] helps w/ medication intake. I don't know how reliable this  Info is, for now will say he takes his medications; when we call w/ lab results, my nurse will talk w/Jason and clarify the issue. CHF: Seems to be doing very well. Continue lisinopril, Lasix, carvedilol. Check a BMP and CBC.Has an appointment to see cardiology this week. Prediabetes: Check A1c BPH, bladder outlet obstruction: Currently without a Foley, reports good flow. Needs close f/u--- RTC 2 months.

## 2016-07-28 NOTE — Progress Notes (Signed)
Pre visit review using our clinic review tool, if applicable. No additional management support is needed unless otherwise documented below in the visit note. 

## 2016-07-28 NOTE — Patient Instructions (Signed)
GO TO THE LAB : Get the blood work     GO TO THE FRONT DESK Schedule your next appointment for a  Check up in 2 months   Call anytime if you feel your legs are swelling up  Please tell your son Juan [name] to see the medication list provided to you today, be sure you are taking the medications as prescribed.

## 2016-07-29 LAB — CBC WITH DIFFERENTIAL/PLATELET
BASOS ABS: 426 {cells}/uL — AB (ref 0–200)
Basophils Relative: 3 %
Eosinophils Absolute: 142 cells/uL (ref 15–500)
Eosinophils Relative: 1 %
HEMATOCRIT: 33.9 % — AB (ref 38.5–50.0)
HEMOGLOBIN: 10.6 g/dL — AB (ref 13.2–17.1)
LYMPHS ABS: 4402 {cells}/uL — AB (ref 850–3900)
LYMPHS PCT: 31 %
MCH: 26.1 pg — AB (ref 27.0–33.0)
MCHC: 31.3 g/dL — ABNORMAL LOW (ref 32.0–36.0)
MCV: 83.5 fL (ref 80.0–100.0)
MONO ABS: 994 {cells}/uL — AB (ref 200–950)
MPV: 9.2 fL (ref 7.5–12.5)
Monocytes Relative: 7 %
NEUTROS PCT: 58 %
Neutro Abs: 8236 cells/uL — ABNORMAL HIGH (ref 1500–7800)
PLATELETS: 411 10*3/uL — AB (ref 140–400)
RBC: 4.06 MIL/uL — ABNORMAL LOW (ref 4.20–5.80)
RDW: 19.5 % — ABNORMAL HIGH (ref 11.0–15.0)
WBC: 14.2 10*3/uL — AB (ref 3.8–10.8)

## 2016-07-30 ENCOUNTER — Ambulatory Visit (HOSPITAL_COMMUNITY)
Admission: RE | Admit: 2016-07-30 | Discharge: 2016-07-30 | Disposition: A | Discharge status: Home / Self Care | Payer: Medicare Other | Source: Ambulatory Visit | Attending: Cardiology | Admitting: Cardiology

## 2016-07-30 VITALS — BP 148/70 | HR 63 | Wt 184.6 lb

## 2016-07-30 DIAGNOSIS — I130 Hypertensive heart and chronic kidney disease with heart failure and stage 1 through stage 4 chronic kidney disease, or unspecified chronic kidney disease: Secondary | ICD-10-CM | POA: Insufficient documentation

## 2016-07-30 DIAGNOSIS — Z87891 Personal history of nicotine dependence: Secondary | ICD-10-CM | POA: Insufficient documentation

## 2016-07-30 DIAGNOSIS — G4733 Obstructive sleep apnea (adult) (pediatric): Secondary | ICD-10-CM | POA: Diagnosis not present

## 2016-07-30 DIAGNOSIS — G43909 Migraine, unspecified, not intractable, without status migrainosus: Secondary | ICD-10-CM | POA: Insufficient documentation

## 2016-07-30 DIAGNOSIS — N183 Chronic kidney disease, stage 3 unspecified: Secondary | ICD-10-CM

## 2016-07-30 DIAGNOSIS — Z9119 Patient's noncompliance with other medical treatment and regimen: Secondary | ICD-10-CM | POA: Diagnosis not present

## 2016-07-30 DIAGNOSIS — Z7982 Long term (current) use of aspirin: Secondary | ICD-10-CM | POA: Insufficient documentation

## 2016-07-30 DIAGNOSIS — I10 Essential (primary) hypertension: Secondary | ICD-10-CM | POA: Diagnosis not present

## 2016-07-30 DIAGNOSIS — E1122 Type 2 diabetes mellitus with diabetic chronic kidney disease: Secondary | ICD-10-CM | POA: Diagnosis not present

## 2016-07-30 DIAGNOSIS — Z818 Family history of other mental and behavioral disorders: Secondary | ICD-10-CM | POA: Diagnosis not present

## 2016-07-30 DIAGNOSIS — C946 Myelodysplastic disease, not classified: Secondary | ICD-10-CM | POA: Insufficient documentation

## 2016-07-30 DIAGNOSIS — I5042 Chronic combined systolic (congestive) and diastolic (congestive) heart failure: Secondary | ICD-10-CM | POA: Diagnosis not present

## 2016-07-30 DIAGNOSIS — I5022 Chronic systolic (congestive) heart failure: Secondary | ICD-10-CM | POA: Diagnosis not present

## 2016-07-30 DIAGNOSIS — I2510 Atherosclerotic heart disease of native coronary artery without angina pectoris: Secondary | ICD-10-CM | POA: Insufficient documentation

## 2016-07-30 DIAGNOSIS — Z9114 Patient's other noncompliance with medication regimen: Secondary | ICD-10-CM | POA: Insufficient documentation

## 2016-07-30 DIAGNOSIS — D631 Anemia in chronic kidney disease: Secondary | ICD-10-CM | POA: Diagnosis not present

## 2016-07-30 DIAGNOSIS — I712 Thoracic aortic aneurysm, without rupture: Secondary | ICD-10-CM | POA: Diagnosis not present

## 2016-07-30 NOTE — Patient Instructions (Signed)
No changes to medication or labs today.  Follow up 6 weeks with Amy Clegg NP-C.  Do the following things EVERYDAY: 1) Weigh yourself in the morning before breakfast. Write it down and keep it in a log. 2) Take your medicines as prescribed 3) Eat low salt foods-Limit salt (sodium) to 2000 mg per day.  4) Stay as active as you can everyday 5) Limit all fluids for the day to less than 2 liters

## 2016-07-30 NOTE — Progress Notes (Signed)
Patient ID: Juan [name], male   DOB: Apr 19, 1941, 76 y.o.   MRN: [id number]    Advanced Heart Failure Clinic Note   PCP: Dr Larose Kells  Primary HF Cardiologist: Dr Haroldine Laws  Primary Cardiologist: Dr Johnsie Cancel   HPI: Juan [name] is a 76 y.o. male with history of Diastolic HF, OSA, HTN, DM2, and myeloproliferative disorder followed by Oncology.   He was seen by onc 10/22/14 for his myeloproliferative disorder. 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   Admitted to [facility] 01/2015 with increased dyspnea and volume overload. Diuresed with lasix and transitioned to lasix 40 mg three times a day.   Admitted twice in  December 2017 with volume overload.  Diuresed with IV lasix the transitioned to lasix 40 mg twice a day. Discharge weight 198 pounds. Discharged to SNF.    Today he returns for HF follow up. He is back at home with his son. Overall feeling ok. Denies SOB/PND/Orthopnea. Completed HH. Walks with a cane. Appetite improving. Requires assistance with transportation. Taking all medications.    ECHO 12/24/2015: Ef 55-60%. Echo 01/21/15 EF 30%, moderate LVH, mild MR, PA peak pressure 59 mm Hg, Normal RV ECHO 12/2015: Ef 55-60%. Grade IDD  Labs 01/29/2015: K 3.9 Creatinine 1.59  Labs 02/14/15: K 3.9 Creatinine 1.72  Labs 02/22/2015: K 4.1 Creatinine 1.82  Labs 05/23/2015: K 5.2 Creatinine 1.39  Labs 07/28/2016: K 4.6 Creatinine 1.29   ROS: All systems negative except as listed in HPI, PMH and Problem List.  SH:  Social History   Social History  . Marital status: Widowed    Spouse name: N/A  . Number of children: 2  . Years of education: N/A   Occupational History  . retired    .  Retired   Social History Main Topics  . Smoking status: Former Smoker    Packs/day: 0.25    Years: 12.00    Types: Cigarettes    Quit date: 05/18/1966  . Smokeless tobacco: Never Used     Comment: quit smoking 45 years  ago  . Alcohol use No  . Drug use: No  . Sexual activity: Yes   Other Topics Concern  . Not on file   Social History Narrative   Moved from [place] 2006   Widow 2007   Son lives with him     FH:  Family History  Problem Relation Age of Onset  . Schizophrenia Son   . Colon cancer Neg Hx   . Prostate cancer Neg Hx   . Heart attack Neg Hx   . Diabetes Neg Hx     Past Medical History:  Diagnosis Date  . Allergic rhinitis   . Anemia   . Ascending aortic aneurysm ([place]) 03/2014   4.3cm on CT scan  . CAD (coronary artery disease)    dx elsewheer in past, no documentation. Non-ischemic myoview 2007  . Chronic diastolic CHF (congestive heart failure), NYHA class 2 (HCC)    Normal EF w/ grade 1 dd by echo 12/2015  . Edema    R>L leg, u/s 5-12 neg for DVT  . Hemorrhoid   . History of thrombocytosis   . Hypertension   . Migraine    "once/wk at least" (07/11/2013)  . Myeloproliferative disease ([place])   . Shortness of breath   . Sinus congestion   . Sleep apnea, obstructive    at some point used CPAP,  was d/c  years ago  . Type II diabetes mellitus ([place])     Current Outpatient Prescriptions  Medication Sig Dispense Refill  . acetaminophen (TYLENOL) 500 MG tablet Take 500-1,000 mg by mouth every 6 (six) hours as needed.    . Amino Acids-Protein Hydrolys (FEEDING SUPPLEMENT, PRO-STAT SUGAR FREE 64,) LIQD Take 30 mLs by mouth 2 (two) times daily.    Marland Kitchen aspirin 325 MG tablet Take 325 mg by mouth daily.    . bisacodyl (DULCOLAX) 10 MG suppository Place 1 suppository (10 mg total) rectally daily as needed for moderate constipation.    . carvedilol (COREG) 3.125 MG tablet Take 1 tablet (3.125 mg total) by mouth 2 (two) times daily.    Marland Kitchen dextromethorphan (DELSYM) 30 MG/5ML liquid Take 5 mLs (30 mg total) by mouth at bedtime as needed for cough. 89 mL 0  . diclofenac sodium (VOLTAREN) 1 % GEL Apply 4 g topically 2 (two) times daily. Apply to bilateral  knees    . feeding supplement,  GLUCERNA SHAKE, (GLUCERNA SHAKE) LIQD Take 237 mLs by mouth daily. Reported on 08/21/2015    . FERROUS GLUCONATE PO Take 240 mg by mouth daily at 10 pm. 1 tablet    . fluticasone (FLONASE) 50 MCG/ACT nasal spray Place 2 sprays into both nostrils daily. Reported on 05/02/2015    . furosemide (LASIX) 20 MG tablet Take 80 mg by mouth daily.    Marland Kitchen gabapentin (NEURONTIN) 100 MG capsule Take 100 mg by mouth 3 (three) times daily as needed (pain). Reported on 08/22/2015    . guaiFENesin (MUCINEX) 600 MG 12 hr tablet Take 2 tablets (1,200 mg total) by mouth 2 (two) times daily. 30 tablet 0  . hydrocerin (EUCERIN) CREA Apply Eucerin cream to BLE Q day after bathing and roughly towel drying to remove loose skin 228 g 0  . HYDROcodone-acetaminophen (NORCO/VICODIN) 5-325 MG tablet Take 1 tablet by mouth every 8 (eight) hours. routinely 20 tablet 0  . lisinopril (PRINIVIL,ZESTRIL) 2.5 MG tablet Take 1 tablet (2.5 mg total) by mouth daily. 30 tablet 0  . simethicone (MYLICON) 80 MG chewable tablet Chew 80 mg by mouth every 6 (six) hours as needed for flatulence.    . vitamin B-12 (CYANOCOBALAMIN) 50 MCG tablet Take 100 mcg by mouth daily. 2 tablets    . allopurinol (ZYLOPRIM) 100 MG tablet Take 1 tablet (100 mg total) by mouth daily. (Patient not taking: Reported on 07/30/2016)     No current facility-administered medications for this encounter.     Vitals:   07/30/16 1143  BP: (!) 148/70  Pulse: 63  SpO2: 96%  Weight: 184 lb 9.6 oz (83.7 kg)   Wt Readings from Last 3 Encounters:  07/30/16 184 lb 9.6 oz (83.7 kg)  07/28/16 185 lb 2 oz (84 kg)  05/12/16 198 lb (89.8 kg)     PHYSICAL EXAM: General:  NAD. Elderly. Arrived in a wheel chair   HEENT: normal Neck: supple. JVP 5-6. Carotids 2+ bilaterally; no bruits. No thyromegaly or lymphadenopathy noted. Cor: PMI normal. RRR. No M/G/R Lungs: Clear, no difficulty Abdomen: soft, non-tender, non-distended, no HSM. No bruits or masses. +BS  Extremities: no  cyanosis, clubbing, R and LLE no edema. Chronic venous stasis changes.   Neuro: alert & orientedx3, cranial nerves grossly intact. Moves all 4 extremities w/o difficulty. Affect pleasant   ASSESSMENT & PLAN: 1. Chronic Systolic CHF, EF 55% echo 01/21/15, Normal myoview 2007.  EF recovered 12/2015. ECHO EF 55-60%. NYHA II-III.  Volume status stable.  Appears euvolemic. Continue lasix 80 mg daily.  Continue lisinopril 2.5 mg daily.  Off sprio due to noncompliance.   2. CKD stage III - Creatinine baseline 1.7-1.9.. Most recent creatinine 1.29  3. HTN- Ok. Continue current regimen.  4. 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 - He has, several times, refused further work up. He does not want further investigation or treatment  . Follow up in 6 weeks. Refer back to paramedicine.  Aadhira Heffernan  NP-C   07/30/16

## 2016-07-30 NOTE — Progress Notes (Addendum)
Advanced Heart Failure Medication Review by a Pharmacist  Does the patient  feel that his/her medications are working for him/her?  yes  Has the patient been experiencing any side effects to the medications prescribed?  yes  Does the patient measure his/her own blood pressure or blood glucose at home?  yes   Does the patient have any problems obtaining medications due to transportation or finances?   no  Understanding of regimen: good Understanding of indications: good Potential of compliance: good Patient understands to avoid NSAIDs. Patient understands to avoid decongestants.  Issues to address at subsequent visits: None   Pharmacist comments: Juan [name] is a pleasant 76 yo AAM presenting to advanced HF clinic today. He did not bring in his medications, stating he will do it next visit. He endorses compliance with his medications. No questions or concerns.   Of note, Juan [name] is using Dristan (nasal decongestant) for the past 6 days. I advised him to not use this medication for more than 3 days due to rebound congestion.   Belia Heman, PharmD PGY1 Pharmacy Resident [phone] (Pager) 07/30/2016 11:56 AM  Time with patient: 10 min Preparation and documentation time: 2 min  Total time: 12 min

## 2016-08-13 ENCOUNTER — Other Ambulatory Visit (HOSPITAL_COMMUNITY): Payer: Self-pay | Admitting: Student

## 2016-08-13 ENCOUNTER — Other Ambulatory Visit (HOSPITAL_COMMUNITY): Payer: Self-pay | Admitting: Cardiology

## 2016-08-13 DIAGNOSIS — I5022 Chronic systolic (congestive) heart failure: Secondary | ICD-10-CM

## 2016-08-13 MED FILL — ISOSORBIDE MN ER 30 MG TAB: 30 | 30 days supply | Qty: 60 | Fill #3

## 2016-08-13 MED FILL — CARVEDILOL 3.125 MG TABLET: 3.125 | 30 days supply | Qty: 60 | Fill #0

## 2016-08-14 ENCOUNTER — Other Ambulatory Visit (HOSPITAL_COMMUNITY): Payer: Self-pay | Admitting: *Deleted

## 2016-08-14 MED ORDER — FUROSEMIDE 40 MG PO TABS: 80.0000 mg | ORAL_TABLET | Freq: Every day | ORAL | 3 refills | Status: DC

## 2016-08-18 ENCOUNTER — Telehealth (HOSPITAL_COMMUNITY): Payer: Self-pay

## 2016-08-18 ENCOUNTER — Other Ambulatory Visit (HOSPITAL_COMMUNITY): Payer: Self-pay | Admitting: *Deleted

## 2016-08-18 MED ORDER — FUROSEMIDE 40 MG PO TABS: 80.0000 mg | ORAL_TABLET | Freq: Every day | ORAL | 3 refills | Status: DC

## 2016-08-18 MED FILL — FUROSEMIDE 40 MG TABLET: 40 | 30 days supply | Qty: 60 | Fill #0

## 2016-08-18 NOTE — Telephone Encounter (Signed)
Spoke with Juan [name] to confirm patient was no longer enrolled in HF FUND. Advised we have now been having patient's meet with our CHF SW Raquel Sarna to determine eligibility and all fund enrollers now have refills sent in on paper forms via fax. Do not see any recent PN from [facility] in chart, pharm also has not received any forms for patient. Advised to run cash pay for now ($9.25 for his lasix) and if patient is unable to afford this he will need to call CHF clinic to speak with Raquel Sarna to determine financial assistance/barriers to afford meds.  Renee Pain, RN

## 2016-09-10 ENCOUNTER — Ambulatory Visit (HOSPITAL_COMMUNITY)
Admission: RE | Admit: 2016-09-10 | Discharge: 2016-09-10 | Disposition: A | Discharge status: Home / Self Care | Payer: Medicare Other | Source: Ambulatory Visit | Attending: Cardiology | Admitting: Cardiology

## 2016-09-10 ENCOUNTER — Encounter (HOSPITAL_COMMUNITY): Payer: Self-pay

## 2016-09-10 VITALS — BP 186/84 | HR 70 | Wt 197.6 lb

## 2016-09-10 DIAGNOSIS — Z9111 Patient's noncompliance with dietary regimen: Secondary | ICD-10-CM | POA: Insufficient documentation

## 2016-09-10 DIAGNOSIS — Z7982 Long term (current) use of aspirin: Secondary | ICD-10-CM | POA: Diagnosis not present

## 2016-09-10 DIAGNOSIS — I5043 Acute on chronic combined systolic (congestive) and diastolic (congestive) heart failure: Secondary | ICD-10-CM | POA: Diagnosis not present

## 2016-09-10 DIAGNOSIS — N183 Chronic kidney disease, stage 3 unspecified: Secondary | ICD-10-CM

## 2016-09-10 DIAGNOSIS — I10 Essential (primary) hypertension: Secondary | ICD-10-CM

## 2016-09-10 DIAGNOSIS — Z818 Family history of other mental and behavioral disorders: Secondary | ICD-10-CM | POA: Diagnosis not present

## 2016-09-10 DIAGNOSIS — Z9119 Patient's noncompliance with other medical treatment and regimen: Secondary | ICD-10-CM | POA: Diagnosis not present

## 2016-09-10 DIAGNOSIS — I5042 Chronic combined systolic (congestive) and diastolic (congestive) heart failure: Secondary | ICD-10-CM | POA: Diagnosis not present

## 2016-09-10 DIAGNOSIS — I130 Hypertensive heart and chronic kidney disease with heart failure and stage 1 through stage 4 chronic kidney disease, or unspecified chronic kidney disease: Secondary | ICD-10-CM | POA: Diagnosis not present

## 2016-09-10 DIAGNOSIS — Z87891 Personal history of nicotine dependence: Secondary | ICD-10-CM | POA: Insufficient documentation

## 2016-09-10 DIAGNOSIS — I5022 Chronic systolic (congestive) heart failure: Secondary | ICD-10-CM

## 2016-09-10 DIAGNOSIS — R609 Edema, unspecified: Secondary | ICD-10-CM | POA: Diagnosis not present

## 2016-09-10 DIAGNOSIS — I712 Thoracic aortic aneurysm, without rupture: Secondary | ICD-10-CM | POA: Diagnosis not present

## 2016-09-10 DIAGNOSIS — D631 Anemia in chronic kidney disease: Secondary | ICD-10-CM | POA: Insufficient documentation

## 2016-09-10 DIAGNOSIS — I2510 Atherosclerotic heart disease of native coronary artery without angina pectoris: Secondary | ICD-10-CM | POA: Diagnosis not present

## 2016-09-10 DIAGNOSIS — G4733 Obstructive sleep apnea (adult) (pediatric): Secondary | ICD-10-CM | POA: Insufficient documentation

## 2016-09-10 DIAGNOSIS — E1122 Type 2 diabetes mellitus with diabetic chronic kidney disease: Secondary | ICD-10-CM | POA: Diagnosis not present

## 2016-09-10 DIAGNOSIS — C946 Myelodysplastic disease, not classified: Secondary | ICD-10-CM | POA: Insufficient documentation

## 2016-09-10 MED ORDER — HYDRALAZINE HCL 25 MG PO TABS: 12.5000 mg | ORAL_TABLET | Freq: Three times a day (TID) | ORAL | 3 refills | Status: DC

## 2016-09-10 MED ORDER — FUROSEMIDE 40 MG PO TABS: ORAL_TABLET | ORAL | 3 refills | Status: DC

## 2016-09-10 MED FILL — hydrALAZINE HCL 25 MG TABS: 25 | 34 days supply | Qty: 51 | Fill #0

## 2016-09-10 MED FILL — CARVEDILOL 3.125 MG TABLET: 3.125 | 30 days supply | Qty: 60 | Fill #1

## 2016-09-10 MED FILL — FUROSEMIDE 40 MG TABLET: 40 | 33 days supply | Qty: 100 | Fill #0

## 2016-09-10 MED FILL — ISOSORBIDE MN ER 30 MG TAB: 30 | 30 days supply | Qty: 60 | Fill #0

## 2016-09-10 NOTE — Patient Instructions (Signed)
INCREASE Lasix to 80 mg (2 Tablets) in the AM and 40 mg (1 Tablet) in the PM.  START taking Hydralazine 12.5 mg (0.5 Tablet) Three Times Daily.  Follow up in 2 weeks, we will provide taxi to bring you to your appointment.

## 2016-09-10 NOTE — Progress Notes (Signed)
Patient ID: Juan [name], male   DOB: [birth year], 76 y.o.   MRN: [id number]    Advanced Heart Failure Clinic Note   PCP: Dr Larose Kells  Primary HF Cardiologist: Dr Haroldine Laws  Primary Cardiologist: Dr Johnsie Cancel   HPI: [name] is a 76 y.o. male with history of Diastolic HF, OSA, HTN, DM2, and myeloproliferative disorder followed by Oncology.   He was seen by onc 10/22/14 for his myeloproliferative disorder. 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   Admitted to [facility] 01/2015 with increased dyspnea and volume overload. Diuresed with lasix and transitioned to lasix 40 mg three times a day.   Admitted twice in  December 2017 with volume overload.  Diuresed with IV lasix the transitioned to lasix 40 mg twice a day. Discharge weight 198 pounds. Discharged to SNF.    Presents today for HF follow up. Weight up 13 pounds in the last month. Eating boiled sausage and grits, drinking more than 2L a day. Had fried chicken and macaroni last night. Weights at home 195-205 pounds. Feels SOB with dressing and bathing sometimes. Denies orthopnea and PND. No chest pain. Not exercising, uses a walker to walk around the house. Taking all medications.    ECHO 12/24/2015: EF 55-60%. Echo 01/21/15 EF 30%, moderate LVH, mild MR, PA peak pressure 59 mm Hg, Normal RV ECHO 12/2015: Ef 55-60%. Grade IDD  Labs 01/29/2015: K 3.9 Creatinine 1.59  Labs 02/14/15: K 3.9 Creatinine 1.72  Labs 02/22/2015: K 4.1 Creatinine 1.82  Labs 05/23/2015: K 5.2 Creatinine 1.39  Labs 07/28/2016: K 4.6 Creatinine 1.29   ROS: All systems negative except as listed in HPI, PMH and Problem List.  SH:  Social History   Social History  . Marital status: Widowed    Spouse name: N/A  . Number of children: 2  . Years of education: N/A   Occupational History  . retired    .  Retired   Social History Main Topics  . Smoking status: Former Smoker   Packs/day: 0.25    Years: 12.00    Types: Cigarettes    Quit date: 05/18/1966  . Smokeless tobacco: Never Used     Comment: quit smoking 45 years ago  . Alcohol use No  . Drug use: No  . Sexual activity: Yes   Other Topics Concern  . Not on file   Social History Narrative   Moved from [place] 2006   Widow 2007   Son lives with him     FH:  Family History  Problem Relation Age of Onset  . Schizophrenia Son   . Colon cancer Neg Hx   . Prostate cancer Neg Hx   . Heart attack Neg Hx   . Diabetes Neg Hx     Past Medical History:  Diagnosis Date  . Allergic rhinitis   . Anemia   . Ascending aortic aneurysm ([place]) 03/2014   4.3cm on CT scan  . CAD (coronary artery disease)    dx elsewheer in past, no documentation. Non-ischemic myoview 2007  . Chronic diastolic CHF (congestive heart failure), NYHA class 2 (HCC)    Normal EF w/ grade 1 dd by echo 12/2015  . Edema    R>L leg, u/s 5-12 neg for DVT  . Hemorrhoid   . History of thrombocytosis   . Hypertension   . Migraine    "once/wk at least" (07/11/2013)  . Myeloproliferative  disease ([place])   . Shortness of breath   . Sinus congestion   . Sleep apnea, obstructive    at some point used CPAP, was d/c  years ago  . Type II diabetes mellitus ([place])     Current Outpatient Prescriptions  Medication Sig Dispense Refill  . aspirin 325 MG tablet Take 325 mg by mouth daily.    . bisacodyl (DULCOLAX) 10 MG suppository Place 1 suppository (10 mg total) rectally daily as needed for moderate constipation.    . carvedilol (COREG) 3.125 MG tablet Take 3.125 mg by mouth 2 (two) times daily with a meal.    . dextromethorphan (DELSYM) 30 MG/5ML liquid Take 5 mLs (30 mg total) by mouth at bedtime as needed for cough. 89 mL 0  . diclofenac sodium (VOLTAREN) 1 % GEL Apply 4 g topically 2 (two) times daily. Apply to bilateral  knees    . feeding supplement, GLUCERNA SHAKE, (GLUCERNA SHAKE) LIQD Take 237 mLs by mouth daily. Reported on 08/21/2015    .  FERROUS GLUCONATE PO Take 240 mg by mouth daily at 10 pm. 1 tablet    . fluticasone (FLONASE) 50 MCG/ACT nasal spray Place 2 sprays into both nostrils daily. Reported on 05/02/2015    . furosemide (LASIX) 40 MG tablet Take 2 tablets (80 mg total) by mouth daily. 60 tablet 3  . gabapentin (NEURONTIN) 100 MG capsule Take 100 mg by mouth 3 (three) times daily as needed (pain). Reported on 08/22/2015    . guaiFENesin (MUCINEX) 600 MG 12 hr tablet Take 2 tablets (1,200 mg total) by mouth 2 (two) times daily. 30 tablet 0  . hydrocerin (EUCERIN) CREA Apply Eucerin cream to BLE Q day after bathing and roughly towel drying to remove loose skin 228 g 0  . HYDROcodone-acetaminophen (NORCO/VICODIN) 5-325 MG tablet Take 1 tablet by mouth every 8 (eight) hours. routinely 20 tablet 0  . isosorbide dinitrate (ISORDIL) 30 MG tablet Take 30 mg by mouth 2 (two) times daily.    . simethicone (MYLICON) 80 MG chewable tablet Chew 80 mg by mouth every 6 (six) hours as needed for flatulence.    . vitamin B-12 (CYANOCOBALAMIN) 50 MCG tablet Take 100 mcg by mouth daily. 2 tablets    . acetaminophen (TYLENOL) 500 MG tablet Take 500-1,000 mg by mouth every 6 (six) hours as needed.    Marland Kitchen allopurinol (ZYLOPRIM) 100 MG tablet Take 1 tablet (100 mg total) by mouth daily. (Patient not taking: Reported on 07/30/2016)    . Amino Acids-Protein Hydrolys (FEEDING SUPPLEMENT, PRO-STAT SUGAR FREE 64,) LIQD Take 30 mLs by mouth 2 (two) times daily.     No current facility-administered medications for this encounter.     Vitals:   09/10/16 1055 09/10/16 1056  BP: (!) 156/72 (!) 186/84  Pulse: 70   SpO2: 93%   Weight: 197 lb 9.6 oz (89.6 kg)    Wt Readings from Last 3 Encounters:  09/10/16 197 lb 9.6 oz (89.6 kg)  07/30/16 184 lb 9.6 oz (83.7 kg)  07/28/16 185 lb 2 oz (84 kg)     PHYSICAL EXAM: General:  Elderly male, NAD. Arrived in wheelchair.   HEENT: normal Neck: supple. JVP elevated to 10 cm. Carotids 2+ bilaterally; no  bruits. No thyromegaly or lymphadenopathy noted. Cor: PMI normal. Regular rate and rhythm. No murmurs, rubs or gallops.  Lungs: Bilateral crackles in lower lobes.   Abdomen: Soft, non-tender,  non-distended, no HSM. No bruits or masses. + bowel sounds Extremities:  no cyanosis, clubbing. Chronic venous stasis changes. 2+ pedal edema.  Neuro: alert & orientedx3, cranial nerves grossly intact. Moves all 4 extremities w/o difficulty. Affect pleasant   ASSESSMENT & PLAN: 1. Chronic Systolic CHF, EF 20% echo 01/21/15, Normal myoview 2007.  EF recovered 12/2015. ECHO EF 55-60%.  - NYHA III. - Weight up 13 pounds in 2 weeks. Feels more SOB.  - Volume status elevated in the setting of dietary noncompliance.  - Increase lasix to '80mg'$  in the am, '40mg'$  in the afternoon. Advised him if he starts to feel dizzy then to stop taking '40mg'$  in the afternoon. We will see him again in 2 weeks.  - No spiro due to noncompliance.  - Continue Coreg 3.'125mg'$  BID - Continue isosorbide '30mg'$  BID.  - Add hydralazine 12.'5mg'$  TID.  2. CKD stage III  - creatinine stable last month.  3. HTN - hypertensive today.  - Will add hydralazine 12.'5mg'$  TID as above, (he has been on this in the past and it was discontinued while he was in a SNF for hypotension).  4. 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 - He has refused treatment and further work up multiple times in the past.   Increase lasix as above. Follow up in 2 weeks for BMET and BP check .   Arbutus Leas  NP-C   09/10/16

## 2016-09-24 ENCOUNTER — Encounter (HOSPITAL_COMMUNITY): Payer: Self-pay

## 2016-09-24 ENCOUNTER — Ambulatory Visit (HOSPITAL_COMMUNITY)
Admission: RE | Admit: 2016-09-24 | Discharge: 2016-09-24 | Disposition: A | Discharge status: Home / Self Care | Payer: Medicare Other | Source: Ambulatory Visit | Attending: Internal Medicine | Admitting: Internal Medicine

## 2016-09-24 ENCOUNTER — Telehealth: Payer: Self-pay | Admitting: Licensed Clinical Social Worker

## 2016-09-24 VITALS — BP 128/72 | HR 75 | Wt 208.2 lb

## 2016-09-24 DIAGNOSIS — I5042 Chronic combined systolic (congestive) and diastolic (congestive) heart failure: Secondary | ICD-10-CM | POA: Diagnosis not present

## 2016-09-24 DIAGNOSIS — Z818 Family history of other mental and behavioral disorders: Secondary | ICD-10-CM | POA: Diagnosis not present

## 2016-09-24 DIAGNOSIS — Z7982 Long term (current) use of aspirin: Secondary | ICD-10-CM | POA: Diagnosis not present

## 2016-09-24 DIAGNOSIS — Z800 Family history of malignant neoplasm of digestive organs: Secondary | ICD-10-CM | POA: Insufficient documentation

## 2016-09-24 DIAGNOSIS — I130 Hypertensive heart and chronic kidney disease with heart failure and stage 1 through stage 4 chronic kidney disease, or unspecified chronic kidney disease: Secondary | ICD-10-CM | POA: Insufficient documentation

## 2016-09-24 DIAGNOSIS — C946 Myelodysplastic disease, not classified: Secondary | ICD-10-CM | POA: Insufficient documentation

## 2016-09-24 DIAGNOSIS — I10 Essential (primary) hypertension: Secondary | ICD-10-CM

## 2016-09-24 DIAGNOSIS — I509 Heart failure, unspecified: Secondary | ICD-10-CM

## 2016-09-24 DIAGNOSIS — Z8249 Family history of ischemic heart disease and other diseases of the circulatory system: Secondary | ICD-10-CM | POA: Insufficient documentation

## 2016-09-24 DIAGNOSIS — I2510 Atherosclerotic heart disease of native coronary artery without angina pectoris: Secondary | ICD-10-CM | POA: Insufficient documentation

## 2016-09-24 DIAGNOSIS — E1122 Type 2 diabetes mellitus with diabetic chronic kidney disease: Secondary | ICD-10-CM | POA: Insufficient documentation

## 2016-09-24 DIAGNOSIS — Z833 Family history of diabetes mellitus: Secondary | ICD-10-CM | POA: Insufficient documentation

## 2016-09-24 DIAGNOSIS — N183 Chronic kidney disease, stage 3 unspecified: Secondary | ICD-10-CM

## 2016-09-24 DIAGNOSIS — Z87891 Personal history of nicotine dependence: Secondary | ICD-10-CM | POA: Diagnosis not present

## 2016-09-24 DIAGNOSIS — I712 Thoracic aortic aneurysm, without rupture: Secondary | ICD-10-CM | POA: Diagnosis not present

## 2016-09-24 DIAGNOSIS — D631 Anemia in chronic kidney disease: Secondary | ICD-10-CM | POA: Insufficient documentation

## 2016-09-24 DIAGNOSIS — I5043 Acute on chronic combined systolic (congestive) and diastolic (congestive) heart failure: Secondary | ICD-10-CM | POA: Diagnosis not present

## 2016-09-24 DIAGNOSIS — G43909 Migraine, unspecified, not intractable, without status migrainosus: Secondary | ICD-10-CM | POA: Diagnosis not present

## 2016-09-24 DIAGNOSIS — Z8042 Family history of malignant neoplasm of prostate: Secondary | ICD-10-CM | POA: Diagnosis not present

## 2016-09-24 DIAGNOSIS — G4733 Obstructive sleep apnea (adult) (pediatric): Secondary | ICD-10-CM | POA: Diagnosis not present

## 2016-09-24 DIAGNOSIS — D471 Chronic myeloproliferative disease: Secondary | ICD-10-CM

## 2016-09-24 LAB — BASIC METABOLIC PANEL
Anion gap: 10 (ref 5–15)
BUN: 57 mg/dL — AB (ref 6–20)
CHLORIDE: 105 mmol/L (ref 101–111)
CO2: 23 mmol/L (ref 22–32)
CREATININE: 1.34 mg/dL — AB (ref 0.61–1.24)
Calcium: 9.8 mg/dL (ref 8.9–10.3)
GFR calc Af Amer: 59 mL/min — ABNORMAL LOW (ref >60)
GFR, EST NON AFRICAN AMERICAN: 51 mL/min — AB (ref >60)
Glucose, Bld: 95 mg/dL (ref 65–99)
Potassium: 4.7 mmol/L (ref 3.5–5.1)
SODIUM: 138 mmol/L (ref 135–145)

## 2016-09-24 MED ORDER — HYDRALAZINE HCL 25 MG PO TABS: 25.0000 mg | ORAL_TABLET | Freq: Three times a day (TID) | ORAL | 3 refills | Status: DC

## 2016-09-24 MED ORDER — POTASSIUM CHLORIDE CRYS ER 20 MEQ PO TBCR: 20.0000 meq | EXTENDED_RELEASE_TABLET | ORAL | 0 refills | Status: DC

## 2016-09-24 MED ORDER — METOLAZONE 2.5 MG PO TABS: 2.5000 mg | ORAL_TABLET | ORAL | 0 refills | Status: DC

## 2016-09-24 MED ORDER — FUROSEMIDE 40 MG PO TABS: 40.0000 mg | ORAL_TABLET | ORAL | 3 refills | Status: DC

## 2016-09-24 MED FILL — POTASSIUM CL ER 20 MEQ TABL: 20 | 5 days supply | Qty: 5 | Fill #0

## 2016-09-24 MED FILL — FUROSEMIDE 40 MG TABLET: 40 | 30 days supply | Qty: 90 | Fill #0

## 2016-09-24 MED FILL — metOLazone 2.5 MG TABS: 2.5 | 5 days supply | Qty: 5 | Fill #0

## 2016-09-24 NOTE — Progress Notes (Signed)
Patient ID: Juan [name], male   DOB: 1940/06/17, 75 y.o.   MRN: [id number]    Advanced Heart Failure Clinic Note   PCP: Dr Larose Kells  Primary HF Cardiologist: Dr Haroldine Laws  Primary Cardiologist: Dr Johnsie Cancel   HPI: Juan [name] is a 76 y.o. male with history of Diastolic HF, OSA, HTN, DM2, and myeloproliferative disorder followed by Oncology.   He was seen by onc 10/22/14 for his myeloproliferative disorder. 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   Admitted to [facility] 01/2015 with increased dyspnea and volume overload. Diuresed with lasix and transitioned to lasix 40 mg three times a day.   Admitted twice in  December 2017 with volume overload.  Diuresed with IV lasix the transitioned to lasix 40 mg twice a day. Discharge weight 198 pounds. Discharged to SNF.    He returns today for HF follow up. Weights at home 201-202. Weight up 11 pounds from last visit 2 weeks ago. Having more SOB. He has not been taking his lasix twice a day as it was prescribed last time. He is still eating sausage for breakfast. Drinking more than 2L a day. He is mostly wheelchair bound, but still SOB at rest at times. Denies orthopnea and PND. Denies chest pain and palpitations.     ECHO 12/24/2015: Ef 55-60%. Echo 01/21/15 EF 30%, moderate LVH, mild MR, PA peak pressure 59 mm Hg, Normal RV ECHO 12/2015: Ef 55-60%. Grade IDD  Labs 01/29/2015: K 3.9 Creatinine 1.59  Labs 02/14/15: K 3.9 Creatinine 1.72  Labs 02/22/2015: K 4.1 Creatinine 1.82  Labs 05/23/2015: K 5.2 Creatinine 1.39  Labs 07/28/2016: K 4.6 Creatinine 1.29   ROS: All systems negative except as listed in HPI, PMH and Problem List.  SH:  Social History   Social History  . Marital status: Widowed    Spouse name: N/A  . Number of children: 2  . Years of education: N/A   Occupational History  . retired    .  Retired   Social History Main Topics  . Smoking  status: Former Smoker    Packs/day: 0.25    Years: 12.00    Types: Cigarettes    Quit date: 05/18/1966  . Smokeless tobacco: Never Used     Comment: quit smoking 45 years ago  . Alcohol use No  . Drug use: No  . Sexual activity: Yes   Other Topics Concern  . Not on file   Social History Narrative   Moved from [place] 2006   Widow 2007   Son lives with him     FH:  Family History  Problem Relation Age of Onset  . Schizophrenia Son   . Colon cancer Neg Hx   . Prostate cancer Neg Hx   . Heart attack Neg Hx   . Diabetes Neg Hx     Past Medical History:  Diagnosis Date  . Allergic rhinitis   . Anemia   . Ascending aortic aneurysm ([place]) 03/2014   4.3cm on CT scan  . CAD (coronary artery disease)    dx elsewheer in past, no documentation. Non-ischemic myoview 2007  . Chronic diastolic CHF (congestive heart failure), NYHA class 2 (HCC)    Normal EF w/ grade 1 dd by echo 12/2015  . Edema    R>L leg, u/s 5-12 neg for DVT  . Hemorrhoid   . History of thrombocytosis   . Hypertension   .  Migraine    "once/wk at least" (07/11/2013)  . Myeloproliferative disease ([place])   . Shortness of breath   . Sinus congestion   . Sleep apnea, obstructive    at some point used CPAP, was d/c  years ago  . Type II diabetes mellitus ([place])     Current Outpatient Prescriptions  Medication Sig Dispense Refill  . acetaminophen (TYLENOL) 500 MG tablet Take 500-1,000 mg by mouth every 6 (six) hours as needed.    . Amino Acids-Protein Hydrolys (FEEDING SUPPLEMENT, PRO-STAT SUGAR FREE 64,) LIQD Take 30 mLs by mouth 2 (two) times daily.    Marland Kitchen aspirin 325 MG tablet Take 325 mg by mouth daily.    . bisacodyl (DULCOLAX) 10 MG suppository Place 1 suppository (10 mg total) rectally daily as needed for moderate constipation.    . carvedilol (COREG) 3.125 MG tablet Take 3.125 mg by mouth 2 (two) times daily with a meal.    . diclofenac sodium (VOLTAREN) 1 % GEL Apply 4 g topically 2 (two) times daily. Apply to  bilateral  knees    . feeding supplement, GLUCERNA SHAKE, (GLUCERNA SHAKE) LIQD Take 237 mLs by mouth daily. Reported on 08/21/2015    . FERROUS GLUCONATE PO Take 240 mg by mouth daily at 10 pm. 1 tablet    . fluticasone (FLONASE) 50 MCG/ACT nasal spray Place 2 sprays into both nostrils daily. Reported on 05/02/2015    . furosemide (LASIX) 40 MG tablet Take 80 mg by mouth daily.    Marland Kitchen gabapentin (NEURONTIN) 100 MG capsule Take 100 mg by mouth 3 (three) times daily as needed (pain). Reported on 08/22/2015    . guaiFENesin (MUCINEX) 600 MG 12 hr tablet Take 2 tablets (1,200 mg total) by mouth 2 (two) times daily. 30 tablet 0  . hydrALAZINE (APRESOLINE) 25 MG tablet Take 0.5 tablets (12.5 mg total) by mouth 3 (three) times daily. 45 tablet 3  . hydrocerin (EUCERIN) CREA Apply Eucerin cream to BLE Q day after bathing and roughly towel drying to remove loose skin 228 g 0  . HYDROcodone-acetaminophen (NORCO/VICODIN) 5-325 MG tablet Take 1 tablet by mouth every 8 (eight) hours. routinely 20 tablet 0  . isosorbide dinitrate (ISORDIL) 30 MG tablet Take 30 mg by mouth 2 (two) times daily.    . simethicone (MYLICON) 80 MG chewable tablet Chew 80 mg by mouth every 6 (six) hours as needed for flatulence.    . vitamin B-12 (CYANOCOBALAMIN) 50 MCG tablet Take 100 mcg by mouth daily. 2 tablets    . allopurinol (ZYLOPRIM) 100 MG tablet Take 1 tablet (100 mg total) by mouth daily. (Patient not taking: Reported on 07/30/2016)    . dextromethorphan (DELSYM) 30 MG/5ML liquid Take 5 mLs (30 mg total) by mouth at bedtime as needed for cough. (Patient not taking: Reported on 09/24/2016) 89 mL 0  . metolazone (ZAROXOLYN) 2.5 MG tablet Take 1 tablet (2.5 mg total) by mouth as directed. 5 tablet 0  . potassium chloride SA (K-DUR,KLOR-CON) 20 MEQ tablet Take 1 tablet (20 mEq total) by mouth as directed. WITH METOLAZONE DOSE ONLY 5 tablet 0   No current facility-administered medications for this encounter.     Vitals:   09/24/16  1111  BP: 128/72  Pulse: 75  SpO2: 96%  Weight: 208 lb 4 oz (94.5 kg)   Wt Readings from Last 3 Encounters:  09/24/16 208 lb 4 oz (94.5 kg)  09/10/16 197 lb 9.6 oz (89.6 kg)  07/30/16 184 lb 9.6 oz (  83.7 kg)     PHYSICAL EXAM: General:  Elderly male, NAD, arrived in wheelchair.  HEENT: normal, atraumatic.  Neck: supple. JVP to jaw.  Carotids 2+ bilaterally; no bruits. No thyromegaly or lymphadenopathy noted. Cor: PMI normal. Regular rate and rhythm. No murmurs, rubs or gallops.  Lungs: Clear to auscultation bilaterally. No wheeze, normal effort.  Abdomen: soft, non-tender, non-distended, no HSM. No bruits or masses. + bowel sounds.  Extremities: no cyanosis, clubbing,2+ edema to knees.Chronic venous stasis changes.  Neuro: alert & orientedx3, cranial nerves grossly intact. Moves all 4 extremities w/o difficulty. Affect pleasant   ASSESSMENT & PLAN: 1. Chronic combined systolic and diastolic CHF, EF 82% echo 01/21/15, Normal myoview 2007.  EF recovered 12/2015. ECHO EF 55-60%.  - NYHA III  - Volume overloaded on exam.  - Give metolazone 2.'5mg'$  tomorrow.  - He was only taking lasix once a day. Increase to '80mg'$  in the am, '40mg'$  in the pm.  - Last K was 4.6, will get BMET today, Will supplement if needed with metolazone tomorrow.   - Continue lisinopril 2.'5mg'$  daily.  - Continue isosorbide '30mg'$   - No spiro with history of noncompliance.  2. CKD stage III - Creatinine baseline 1.7-1.9. - Stable.  - BMET today.  3. HTN - Well controlled on current regimen.  - increase hydralazine to '25mg'$  TID for afterload reduction.  4. 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 - He has, several times, refused further work up. He does not want further investigation or treatment  Referral to paramedicine sent. Return in one week for BMET and to assess volume. Weight up 11 pounds in 2 weeks, increase meds as above. We talked about limiting salt in his diet. He eats  breakfast sausage daily, sometimes 2 times a daty.   Arbutus Leas  NP-C   09/24/16

## 2016-09-24 NOTE — Telephone Encounter (Signed)
CSW referred to assist patient with options for prescription assistance as he has been using the HF fund for payment. CSW attempted to contact patient but unable to leave a message. Patient appears to have Medicare A&B although no D for prescription coverage. CSW will try to follow up on next clinic visit or reach by phone. Raquel Sarna, [place], [place]

## 2016-09-24 NOTE — Patient Instructions (Signed)
INCREASE Hydralazine to 25 mg, one tab three times daily INCREASE Lasix to 80 mg(2 tabs) in the AM and 40 mg(1 tab) in the PM  START Metolazone 2.5 mg, one tab TOMORROW ONLY START Potassium 20 mEQ, one tab TOMORROW ONLY  Labs today We will only contact you if something comes back abnormal or we need to make some changes. Otherwise no news is good news!   Your physician recommends that you schedule a follow-up appointment in: 1 week with Rebecca Eaton (bmet)

## 2016-09-29 ENCOUNTER — Telehealth: Payer: Self-pay | Admitting: *Deleted

## 2016-09-29 NOTE — Telephone Encounter (Signed)
No voicemail option. No answer.

## 2016-10-01 ENCOUNTER — Ambulatory Visit: Payer: Self-pay | Admitting: Internal Medicine

## 2016-10-01 ENCOUNTER — Telehealth: Payer: Self-pay | Admitting: Internal Medicine

## 2016-10-01 NOTE — Telephone Encounter (Signed)
No charge. 

## 2016-10-01 NOTE — Telephone Encounter (Signed)
Patient cancelled 10:30am appointment today due to diarrhea, patient [facility] to 10/26/16, charge or no charge

## 2016-10-02 ENCOUNTER — Ambulatory Visit (HOSPITAL_COMMUNITY)
Admission: RE | Admit: 2016-10-02 | Discharge: 2016-10-02 | Disposition: A | Discharge status: Home / Self Care | Payer: Medicare Other | Source: Ambulatory Visit | Attending: Cardiology | Admitting: Cardiology

## 2016-10-02 ENCOUNTER — Encounter (HOSPITAL_COMMUNITY): Payer: Self-pay

## 2016-10-02 ENCOUNTER — Other Ambulatory Visit (HOSPITAL_COMMUNITY): Payer: Self-pay

## 2016-10-02 VITALS — BP 168/72 | HR 71 | Wt 200.2 lb

## 2016-10-02 DIAGNOSIS — I5022 Chronic systolic (congestive) heart failure: Secondary | ICD-10-CM

## 2016-10-02 DIAGNOSIS — N183 Chronic kidney disease, stage 3 unspecified: Secondary | ICD-10-CM

## 2016-10-02 DIAGNOSIS — R609 Edema, unspecified: Secondary | ICD-10-CM

## 2016-10-02 DIAGNOSIS — Z87891 Personal history of nicotine dependence: Secondary | ICD-10-CM | POA: Insufficient documentation

## 2016-10-02 DIAGNOSIS — I712 Thoracic aortic aneurysm, without rupture: Secondary | ICD-10-CM | POA: Insufficient documentation

## 2016-10-02 DIAGNOSIS — E1122 Type 2 diabetes mellitus with diabetic chronic kidney disease: Secondary | ICD-10-CM | POA: Insufficient documentation

## 2016-10-02 DIAGNOSIS — Z79899 Other long term (current) drug therapy: Secondary | ICD-10-CM | POA: Diagnosis not present

## 2016-10-02 DIAGNOSIS — C946 Myelodysplastic disease, not classified: Secondary | ICD-10-CM | POA: Insufficient documentation

## 2016-10-02 DIAGNOSIS — I2510 Atherosclerotic heart disease of native coronary artery without angina pectoris: Secondary | ICD-10-CM | POA: Insufficient documentation

## 2016-10-02 DIAGNOSIS — Z7982 Long term (current) use of aspirin: Secondary | ICD-10-CM | POA: Insufficient documentation

## 2016-10-02 DIAGNOSIS — D471 Chronic myeloproliferative disease: Secondary | ICD-10-CM

## 2016-10-02 DIAGNOSIS — I10 Essential (primary) hypertension: Secondary | ICD-10-CM | POA: Diagnosis not present

## 2016-10-02 DIAGNOSIS — I130 Hypertensive heart and chronic kidney disease with heart failure and stage 1 through stage 4 chronic kidney disease, or unspecified chronic kidney disease: Secondary | ICD-10-CM | POA: Diagnosis not present

## 2016-10-02 LAB — BASIC METABOLIC PANEL
Anion gap: 7 (ref 5–15)
BUN: 58 mg/dL — AB (ref 6–20)
CALCIUM: 10.2 mg/dL (ref 8.9–10.3)
CO2: 27 mmol/L (ref 22–32)
Chloride: 107 mmol/L (ref 101–111)
Creatinine, Ser: 1.35 mg/dL — ABNORMAL HIGH (ref 0.61–1.24)
GFR calc Af Amer: 58 mL/min — ABNORMAL LOW (ref >60)
GFR, EST NON AFRICAN AMERICAN: 50 mL/min — AB (ref >60)
Glucose, Bld: 106 mg/dL — ABNORMAL HIGH (ref 65–99)
Potassium: 4.4 mmol/L (ref 3.5–5.1)
SODIUM: 141 mmol/L (ref 135–145)

## 2016-10-02 MED ORDER — HYDRALAZINE HCL 50 MG PO TABS: 50.0000 mg | ORAL_TABLET | Freq: Three times a day (TID) | ORAL | 1 refills | Status: DC

## 2016-10-02 MED ORDER — ISOSORBIDE DINITRATE 30 MG PO TABS: 30.0000 mg | ORAL_TABLET | Freq: Two times a day (BID) | ORAL | 1 refills | Status: DC

## 2016-10-02 MED ORDER — CARVEDILOL 3.125 MG PO TABS: 3.1250 mg | ORAL_TABLET | Freq: Two times a day (BID) | ORAL | 1 refills | Status: DC

## 2016-10-02 MED FILL — hydrALAZINE HCL 50 MG TABS: 50 | 34 days supply | Qty: 102 | Fill #0

## 2016-10-02 NOTE — Patient Instructions (Addendum)
Routine lab work today. Will notify you of abnormal results, otherwise no news is good news!  Refills of Hydralazine, Imdur, and Coreg sent to [facility] under [place].  INCREASE Hydralazine to 50 mg three times daily.  Follow up Wednesday June 6th at 10:00 am.  Do the following things EVERYDAY: 1) Weigh yourself in the morning before breakfast. Write it down and keep it in a log. 2) Take your medicines as prescribed 3) Eat low salt foods-Limit salt (sodium) to 2000 mg per day.  4) Stay as active as you can everyday 5) Limit all fluids for the day to less than 2 liters

## 2016-10-02 NOTE — Progress Notes (Signed)
Patient ID: Juan [name], male   DOB: Mar 27, 1941, 76 y.o.   MRN: [id number]    Advanced Heart Failure Clinic Note   PCP: Dr Larose Kells  Primary HF Cardiologist: Dr Haroldine Laws  Primary Cardiologist: Dr Johnsie Cancel   HPI: Juan [name] is a 76 y.o. male with history of Diastolic HF, OSA, HTN, DM2, and myeloproliferative disorder followed by Oncology.   He was seen by onc 10/22/14 for his myeloproliferative disorder. 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   Admitted to [facility] 01/2015 with increased dyspnea and volume overload. Diuresed with lasix and transitioned to lasix 40 mg three times a day.   Admitted twice in  December 2017 with volume overload.  Diuresed with IV lasix the transitioned to lasix 40 mg twice a day. Discharge weight 198 pounds. Discharged to SNF.    He returns today for follow up. At last visin given dose of metolazone and lasix increased. Down 8 lbs in one week. Feeling much better. Continues to eat poorly.  Has had diarrhea x 2 days but now resolved.  Carries a pickle jar to urinate in because of his lasix. Denies orthopnea or PND. Legs remains swollen chronically. States he cannot tolerate compression hose as they "make my heart hurt". Denies chest pain or palpitations. Drinking more than 2 L daily.    ECHO 12/24/2015: Ef 55-60%. Echo 01/21/15 EF 30%, moderate LVH, mild MR, PA peak pressure 59 mm Hg, Normal RV ECHO 12/2015: Ef 55-60%. Grade IDD  Labs 01/29/2015: K 3.9 Creatinine 1.59  Labs 02/14/15: K 3.9 Creatinine 1.72  Labs 02/22/2015: K 4.1 Creatinine 1.82  Labs 05/23/2015: K 5.2 Creatinine 1.39  Labs 07/28/2016: K 4.6 Creatinine 1.29   Review of systems complete and found to be negative unless listed in HPI.    SH:  Social History   Social History  . Marital status: Widowed    Spouse name: N/A  . Number of children: 2  . Years of education: N/A   Occupational History  . retired     .  Retired   Social History Main Topics  . Smoking status: Former Smoker    Packs/day: 0.25    Years: 12.00    Types: Cigarettes    Quit date: 05/18/1966  . Smokeless tobacco: Never Used     Comment: quit smoking 45 years ago  . Alcohol use No  . Drug use: No  . Sexual activity: Yes   Other Topics Concern  . Not on file   Social History Narrative   Moved from [place] 2006   Widow 2007   Son lives with him     FH:  Family History  Problem Relation Age of Onset  . Schizophrenia Son   . Colon cancer Neg Hx   . Prostate cancer Neg Hx   . Heart attack Neg Hx   . Diabetes Neg Hx     Past Medical History:  Diagnosis Date  . Allergic rhinitis   . Anemia   . Ascending aortic aneurysm ([place]) 03/2014   4.3cm on CT scan  . CAD (coronary artery disease)    dx elsewheer in past, no documentation. Non-ischemic myoview 2007  . Chronic diastolic CHF (congestive heart failure), NYHA class 2 (HCC)    Normal EF w/ grade 1 dd by echo 12/2015  . Edema    R>L leg, u/s 5-12 neg for DVT  . Hemorrhoid   .  History of thrombocytosis   . Hypertension   . Migraine    "once/wk at least" (07/11/2013)  . Myeloproliferative disease ([place])   . Shortness of breath   . Sinus congestion   . Sleep apnea, obstructive    at some point used CPAP, was d/c  years ago  . Type II diabetes mellitus ([place])     Current Outpatient Prescriptions  Medication Sig Dispense Refill  . acetaminophen (TYLENOL) 500 MG tablet Take 500-1,000 mg by mouth every 6 (six) hours as needed.    . Amino Acids-Protein Hydrolys (FEEDING SUPPLEMENT, PRO-STAT SUGAR FREE 64,) LIQD Take 30 mLs by mouth 2 (two) times daily.    Marland Kitchen aspirin 325 MG tablet Take 325 mg by mouth daily.    . B Complex Vitamins (VITAMIN B COMPLEX PO) Take 100 mg by mouth daily.    . bisacodyl (DULCOLAX) 10 MG suppository Place 1 suppository (10 mg total) rectally daily as needed for moderate constipation.    . carvedilol (COREG) 3.125 MG tablet Take 3.125 mg by  mouth 2 (two) times daily with a meal.    . dextromethorphan (DELSYM) 30 MG/5ML liquid Take 5 mLs (30 mg total) by mouth at bedtime as needed for cough. 89 mL 0  . diclofenac sodium (VOLTAREN) 1 % GEL Apply 4 g topically 2 (two) times daily. Apply to bilateral  knees    . feeding supplement, GLUCERNA SHAKE, (GLUCERNA SHAKE) LIQD Take 237 mLs by mouth daily. Reported on 08/21/2015    . FERROUS GLUCONATE PO Take 240 mg by mouth daily at 10 pm. 1 tablet    . fluticasone (FLONASE) 50 MCG/ACT nasal spray Place 2 sprays into both nostrils daily. Reported on 05/02/2015    . furosemide (LASIX) 40 MG tablet Take 1-2 tablets (40-80 mg total) by mouth as directed. 80 MG (2 TABS) IN THE AM AND 40 MG (1 TAB) IN THE PM 90 tablet 3  . gabapentin (NEURONTIN) 100 MG capsule Take 100 mg by mouth 3 (three) times daily as needed (pain). Reported on 08/22/2015    . guaiFENesin (MUCINEX) 600 MG 12 hr tablet Take 2 tablets (1,200 mg total) by mouth 2 (two) times daily. 30 tablet 0  . hydrALAZINE (APRESOLINE) 25 MG tablet Take 12.5 mg by mouth 3 (three) times daily.    . hydrocerin (EUCERIN) CREA Apply Eucerin cream to BLE Q day after bathing and roughly towel drying to remove loose skin 228 g 0  . HYDROcodone-acetaminophen (NORCO/VICODIN) 5-325 MG tablet Take 1 tablet by mouth every 8 (eight) hours. routinely 20 tablet 0  . isosorbide dinitrate (ISORDIL) 30 MG tablet Take 30 mg by mouth 2 (two) times daily.    . metolazone (ZAROXOLYN) 2.5 MG tablet Take 1 tablet (2.5 mg total) by mouth as directed. 5 tablet 0  . potassium chloride SA (K-DUR,KLOR-CON) 20 MEQ tablet Take 1 tablet (20 mEq total) by mouth as directed. WITH METOLAZONE DOSE ONLY 5 tablet 0  . simethicone (MYLICON) 80 MG chewable tablet Chew 80 mg by mouth every 6 (six) hours as needed for flatulence.     No current facility-administered medications for this encounter.     Vitals:   10/02/16 1126  BP: (!) 168/72  Pulse: 71  SpO2: 93%  Weight: 200 lb 4 oz  (90.8 kg)   Wt Readings from Last 3 Encounters:  10/02/16 200 lb 4 oz (90.8 kg)  09/24/16 208 lb 4 oz (94.5 kg)  09/10/16 197 lb 9.6 oz (89.6 kg)  PHYSICAL EXAM: General: Elderly male, NAD, arrived in wheelchair.  HEENT: normal Neck: supple. JVD 7-8 Carotids 2+ bilat; no bruits. No thyromegaly or nodule noted. Cor: PMI nondisplaced. RRR, No M/G/R noted Lungs: CTAB, normal effort. Abdomen: soft, non-tender, distended, no HSM. No bruits or masses. +BS  Extremities: no cyanosis, clubbing, or rash. 1+ ankle edema with chronic venous stasis changes.  Neuro: alert & orientedx3, cranial nerves grossly intact. moves all 4 extremities w/o difficulty. Affect  Flat but appropriate.   ASSESSMENT & PLAN: 1. Chronic combined systolic and diastolic CHF, EF 65% echo 01/21/15, Normal myoview 2007.  EF recovered 12/2015. ECHO EF 55-60%.  - NYHA III at baseline - Volume much improved.  - Continue lasix 80 mg q am and 40 mg q pm.  - Only take metolazone as needed. - Paramedicine to see if he agrees. They are here today to establish.  - BMET today.  - Continue lisinopril 2.'5mg'$  daily.  - Continue isosorbide '30mg'$   - No spiro with history of noncompliance.  - Reinforced fluid restriction to < 2 L daily, sodium restriction to less than 2000 mg daily, and the importance of daily weights.   2. CKD stage III - Creatinine baseline 1.7-1.9. - BMET today.  3. HTN - Elevated today.  - Increase hydralazine to 50 mg TID.  4. 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 - He has, several times, refused further work up. He does not want further investigation or treatment. No change.   Start paramedicine. Fluid much improved. Encouraged low salt and fluid restriction. See back in 3-4 weeks.   Juan Friar, PA-C  10/02/16   Greater than 50% of the 256 minute visit was spent in counseling/coordination of care regarding disease state education, paramedicine, salt/fluid  restriction, and medication compliance.

## 2016-10-02 NOTE — Progress Notes (Signed)
Paramedicine Encounter   Patient ID: Juan [name] , male,   DOB: [birth year],75 y.o.,  MRN: [id number]   Met patient in clinic today with provider Jonni Sanger.  pt CAO x4 sitting in the wheelchair c/o right arm pain due to xray from December last year.  He stated that the xray tech "messed up his hand, the machine must have messed it up".   Jinny Blossom stated that pt had several bottles of the same medications, with different dosages and instructions than what he is supposed to be taking.  She sorted them out during this visit. Pt denies any sob, dizziness, headache or chest pain. Pt lost 8lbs of fluid after taking a metolazone . Jonni Sanger advised that pt's fluid level is Ok "use compression hose" to reduce the swelling in his legs. Pt refused due to advise that he had gotten from a doctor in [place] (pt has been in [place] since 2007 from [place]).  **increase 50 mg hydralazine (2 pills 3 times a day); come back in about 3 weeks. talk to [place] about medication.  return june 6 @ 10am; next cab volcher.  today weight 204 Time spent with patient 30 mins  Kristel Durkee, EMT-Paramedic 10/02/2016   ACTION: Next visit planned for next thursday

## 2016-10-08 ENCOUNTER — Inpatient Hospital Stay (HOSPITAL_COMMUNITY)
Admission: EM | Admit: 2016-10-08 | Discharge: 2016-10-18 | DRG: 291 | Disposition: A | Discharge status: Home / Self Care | Payer: Medicare Other | Attending: Internal Medicine | Admitting: Internal Medicine

## 2016-10-08 ENCOUNTER — Encounter (HOSPITAL_COMMUNITY): Payer: Self-pay | Admitting: Emergency Medicine

## 2016-10-08 ENCOUNTER — Emergency Department (HOSPITAL_COMMUNITY): Payer: Medicare Other

## 2016-10-08 DIAGNOSIS — D649 Anemia, unspecified: Secondary | ICD-10-CM | POA: Diagnosis not present

## 2016-10-08 DIAGNOSIS — Z818 Family history of other mental and behavioral disorders: Secondary | ICD-10-CM

## 2016-10-08 DIAGNOSIS — I5033 Acute on chronic diastolic (congestive) heart failure: Secondary | ICD-10-CM | POA: Diagnosis not present

## 2016-10-08 DIAGNOSIS — M5030 Other cervical disc degeneration, unspecified cervical region: Secondary | ICD-10-CM | POA: Diagnosis present

## 2016-10-08 DIAGNOSIS — M79602 Pain in left arm: Secondary | ICD-10-CM

## 2016-10-08 DIAGNOSIS — N183 Chronic kidney disease, stage 3 unspecified: Secondary | ICD-10-CM | POA: Diagnosis present

## 2016-10-08 DIAGNOSIS — C946 Myelodysplastic disease, not classified: Secondary | ICD-10-CM | POA: Diagnosis not present

## 2016-10-08 DIAGNOSIS — S43422A Sprain of left rotator cuff capsule, initial encounter: Secondary | ICD-10-CM | POA: Diagnosis present

## 2016-10-08 DIAGNOSIS — N179 Acute kidney failure, unspecified: Secondary | ICD-10-CM | POA: Diagnosis present

## 2016-10-08 DIAGNOSIS — Z87891 Personal history of nicotine dependence: Secondary | ICD-10-CM

## 2016-10-08 DIAGNOSIS — G4733 Obstructive sleep apnea (adult) (pediatric): Secondary | ICD-10-CM | POA: Diagnosis present

## 2016-10-08 DIAGNOSIS — Z794 Long term (current) use of insulin: Secondary | ICD-10-CM

## 2016-10-08 DIAGNOSIS — N390 Urinary tract infection, site not specified: Secondary | ICD-10-CM | POA: Diagnosis not present

## 2016-10-08 DIAGNOSIS — J069 Acute upper respiratory infection, unspecified: Secondary | ICD-10-CM

## 2016-10-08 DIAGNOSIS — M79603 Pain in arm, unspecified: Secondary | ICD-10-CM

## 2016-10-08 DIAGNOSIS — E875 Hyperkalemia: Secondary | ICD-10-CM | POA: Diagnosis not present

## 2016-10-08 DIAGNOSIS — Z22322 Carrier or suspected carrier of Methicillin resistant Staphylococcus aureus: Secondary | ICD-10-CM

## 2016-10-08 DIAGNOSIS — R0902 Hypoxemia: Secondary | ICD-10-CM | POA: Diagnosis not present

## 2016-10-08 DIAGNOSIS — J189 Pneumonia, unspecified organism: Secondary | ICD-10-CM | POA: Diagnosis not present

## 2016-10-08 DIAGNOSIS — Z7951 Long term (current) use of inhaled steroids: Secondary | ICD-10-CM

## 2016-10-08 DIAGNOSIS — R0602 Shortness of breath: Secondary | ICD-10-CM | POA: Diagnosis not present

## 2016-10-08 DIAGNOSIS — R339 Retention of urine, unspecified: Secondary | ICD-10-CM | POA: Diagnosis present

## 2016-10-08 DIAGNOSIS — I712 Thoracic aortic aneurysm, without rupture: Secondary | ICD-10-CM | POA: Diagnosis present

## 2016-10-08 DIAGNOSIS — I2721 Secondary pulmonary arterial hypertension: Secondary | ICD-10-CM

## 2016-10-08 DIAGNOSIS — R942 Abnormal results of pulmonary function studies: Secondary | ICD-10-CM

## 2016-10-08 DIAGNOSIS — D7581 Myelofibrosis: Secondary | ICD-10-CM | POA: Diagnosis not present

## 2016-10-08 DIAGNOSIS — I272 Pulmonary hypertension, unspecified: Secondary | ICD-10-CM | POA: Diagnosis present

## 2016-10-08 DIAGNOSIS — R52 Pain, unspecified: Secondary | ICD-10-CM

## 2016-10-08 DIAGNOSIS — Z7982 Long term (current) use of aspirin: Secondary | ICD-10-CM

## 2016-10-08 DIAGNOSIS — R509 Fever, unspecified: Secondary | ICD-10-CM

## 2016-10-08 DIAGNOSIS — Z993 Dependence on wheelchair: Secondary | ICD-10-CM

## 2016-10-08 DIAGNOSIS — E1122 Type 2 diabetes mellitus with diabetic chronic kidney disease: Secondary | ICD-10-CM | POA: Diagnosis present

## 2016-10-08 DIAGNOSIS — Z9111 Patient's noncompliance with dietary regimen: Secondary | ICD-10-CM

## 2016-10-08 DIAGNOSIS — K5900 Constipation, unspecified: Secondary | ICD-10-CM | POA: Diagnosis not present

## 2016-10-08 DIAGNOSIS — N400 Enlarged prostate without lower urinary tract symptoms: Secondary | ICD-10-CM | POA: Diagnosis present

## 2016-10-08 DIAGNOSIS — I130 Hypertensive heart and chronic kidney disease with heart failure and stage 1 through stage 4 chronic kidney disease, or unspecified chronic kidney disease: Principal | ICD-10-CM | POA: Diagnosis present

## 2016-10-08 DIAGNOSIS — R05 Cough: Secondary | ICD-10-CM | POA: Diagnosis not present

## 2016-10-08 DIAGNOSIS — D731 Hypersplenism: Secondary | ICD-10-CM | POA: Diagnosis present

## 2016-10-08 DIAGNOSIS — E1142 Type 2 diabetes mellitus with diabetic polyneuropathy: Secondary | ICD-10-CM

## 2016-10-08 DIAGNOSIS — E119 Type 2 diabetes mellitus without complications: Secondary | ICD-10-CM

## 2016-10-08 DIAGNOSIS — I2510 Atherosclerotic heart disease of native coronary artery without angina pectoris: Secondary | ICD-10-CM | POA: Diagnosis present

## 2016-10-08 DIAGNOSIS — D473 Essential (hemorrhagic) thrombocythemia: Secondary | ICD-10-CM | POA: Diagnosis not present

## 2016-10-08 LAB — CBC WITH DIFFERENTIAL/PLATELET
BASOS ABS: 0.1 10*3/uL (ref 0.0–0.1)
Band Neutrophils: 0 %
Basophils Relative: 1 %
Blasts: 0 %
Eosinophils Absolute: 0.4 10*3/uL (ref 0.0–0.7)
Eosinophils Relative: 4 %
HCT: 33.7 % — ABNORMAL LOW (ref 39.0–52.0)
Hemoglobin: 10.6 g/dL — ABNORMAL LOW (ref 13.0–17.0)
LYMPHS ABS: 2.1 10*3/uL (ref 0.7–4.0)
Lymphocytes Relative: 21 %
MCH: 25.5 pg — ABNORMAL LOW (ref 26.0–34.0)
MCHC: 31.5 g/dL (ref 30.0–36.0)
MCV: 81.2 fL (ref 78.0–100.0)
MONO ABS: 1.3 10*3/uL — AB (ref 0.1–1.0)
MYELOCYTES: 0 %
Metamyelocytes Relative: 0 %
Monocytes Relative: 13 %
NEUTROS PCT: 61 %
Neutro Abs: 6.2 10*3/uL (ref 1.7–7.7)
PROMYELOCYTES ABS: 0 %
Platelets: 618 10*3/uL — ABNORMAL HIGH (ref 150–400)
RBC: 4.15 MIL/uL — AB (ref 4.22–5.81)
RDW: 18.7 % — ABNORMAL HIGH (ref 11.5–15.5)
WBC Morphology: INCREASED
WBC: 10.1 10*3/uL (ref 4.0–10.5)
nRBC: 0 /100 WBC

## 2016-10-08 LAB — URINALYSIS, ROUTINE W REFLEX MICROSCOPIC
Bilirubin Urine: NEGATIVE
GLUCOSE, UA: NEGATIVE mg/dL
Hgb urine dipstick: NEGATIVE
Ketones, ur: NEGATIVE mg/dL
NITRITE: NEGATIVE
Specific Gravity, Urine: 1.011 (ref 1.005–1.030)
pH: 6 (ref 5.0–8.0)

## 2016-10-08 LAB — COMPREHENSIVE METABOLIC PANEL
ALK PHOS: 104 U/L (ref 38–126)
ALT: 15 U/L — AB (ref 17–63)
AST: 32 U/L (ref 15–41)
Albumin: 3.2 g/dL — ABNORMAL LOW (ref 3.5–5.0)
Anion gap: 10 (ref 5–15)
BILIRUBIN TOTAL: 0.6 mg/dL (ref 0.3–1.2)
BUN: 53 mg/dL — ABNORMAL HIGH (ref 6–20)
CO2: 22 mmol/L (ref 22–32)
CREATININE: 1.41 mg/dL — AB (ref 0.61–1.24)
Calcium: 9.8 mg/dL (ref 8.9–10.3)
Chloride: 106 mmol/L (ref 101–111)
GFR, EST AFRICAN AMERICAN: 55 mL/min — AB (ref >60)
GFR, EST NON AFRICAN AMERICAN: 48 mL/min — AB (ref >60)
Glucose, Bld: 109 mg/dL — ABNORMAL HIGH (ref 65–99)
Potassium: 4.4 mmol/L (ref 3.5–5.1)
Sodium: 138 mmol/L (ref 135–145)
TOTAL PROTEIN: 6 g/dL — AB (ref 6.5–8.1)

## 2016-10-08 LAB — I-STAT TROPONIN, ED: Troponin i, poc: 0.02 ng/mL (ref 0.00–0.08)

## 2016-10-08 LAB — BRAIN NATRIURETIC PEPTIDE: B Natriuretic Peptide: 328.5 pg/mL — ABNORMAL HIGH (ref 0.0–100.0)

## 2016-10-08 LAB — I-STAT CG4 LACTIC ACID, ED: Lactic Acid, Venous: 0.69 mmol/L (ref 0.5–1.9)

## 2016-10-08 LAB — PATHOLOGIST SMEAR REVIEW

## 2016-10-08 LAB — PROCALCITONIN: Procalcitonin: 0.14 ng/mL

## 2016-10-08 MED ORDER — ACETAMINOPHEN 325 MG PO TABS
650.0000 mg | ORAL_TABLET | Freq: Once | ORAL | Status: AC
  Administered 2016-10-08: 650 mg via ORAL
  Filled 2016-10-08: qty 2

## 2016-10-08 MED ORDER — DEXTROSE 5 % IV SOLN
1.0000 g | INTRAVENOUS | Status: DC
  Administered 2016-10-09 – 2016-10-15 (×7): 1 g via INTRAVENOUS
  Filled 2016-10-08 (×7): qty 10

## 2016-10-08 MED ORDER — ENOXAPARIN SODIUM 40 MG/0.4ML ~~LOC~~ SOLN
40.0000 mg | SUBCUTANEOUS | Status: DC
  Administered 2016-10-08 – 2016-10-09 (×2): 40 mg via SUBCUTANEOUS
  Filled 2016-10-08 (×2): qty 0.4

## 2016-10-08 MED ORDER — DEXTROSE 5 % IV SOLN
1.0000 g | Freq: Once | INTRAVENOUS | Status: AC
  Administered 2016-10-08: 1 g via INTRAVENOUS
  Filled 2016-10-08: qty 10

## 2016-10-08 MED ORDER — ASPIRIN 325 MG PO TABS
325.0000 mg | ORAL_TABLET | Freq: Every day | ORAL | Status: DC
  Administered 2016-10-08 – 2016-10-15 (×8): 325 mg via ORAL
  Filled 2016-10-08 (×8): qty 1

## 2016-10-08 MED ORDER — FUROSEMIDE 10 MG/ML IJ SOLN
80.0000 mg | Freq: Two times a day (BID) | INTRAMUSCULAR | Status: DC
  Administered 2016-10-08 – 2016-10-10 (×5): 80 mg via INTRAVENOUS
  Filled 2016-10-08 (×7): qty 8

## 2016-10-08 MED ORDER — DEXTROSE 5 % IV SOLN
500.0000 mg | Freq: Once | INTRAVENOUS | Status: AC
  Administered 2016-10-08: 500 mg via INTRAVENOUS
  Filled 2016-10-08: qty 500

## 2016-10-08 MED ORDER — ACETAMINOPHEN 325 MG PO TABS
650.0000 mg | ORAL_TABLET | Freq: Four times a day (QID) | ORAL | Status: DC | PRN
  Administered 2016-10-08: 650 mg via ORAL
  Filled 2016-10-08 (×2): qty 2

## 2016-10-08 MED ORDER — ACETAMINOPHEN 650 MG RE SUPP: 650.0000 mg | Freq: Four times a day (QID) | RECTAL | Status: DC | PRN

## 2016-10-08 MED ORDER — ISOSORBIDE DINITRATE 10 MG PO TABS
30.0000 mg | ORAL_TABLET | Freq: Two times a day (BID) | ORAL | Status: DC
  Administered 2016-10-08 – 2016-10-18 (×20): 30 mg via ORAL
  Filled 2016-10-08 (×21): qty 3

## 2016-10-08 MED ORDER — POTASSIUM CHLORIDE CRYS ER 20 MEQ PO TBCR
40.0000 meq | EXTENDED_RELEASE_TABLET | Freq: Two times a day (BID) | ORAL | Status: DC
  Administered 2016-10-08 – 2016-10-09 (×4): 40 meq via ORAL
  Filled 2016-10-08 (×4): qty 2

## 2016-10-08 MED ORDER — CARVEDILOL 3.125 MG PO TABS
3.1250 mg | ORAL_TABLET | Freq: Two times a day (BID) | ORAL | Status: DC
  Administered 2016-10-08 – 2016-10-18 (×21): 3.125 mg via ORAL
  Filled 2016-10-08 (×21): qty 1

## 2016-10-08 MED ORDER — HYDROCODONE-ACETAMINOPHEN 5-325 MG PO TABS
1.0000 | ORAL_TABLET | Freq: Three times a day (TID) | ORAL | Status: DC
  Administered 2016-10-08 – 2016-10-10 (×6): 1 via ORAL
  Filled 2016-10-08 (×6): qty 1

## 2016-10-08 MED ORDER — HYDRALAZINE HCL 50 MG PO TABS
50.0000 mg | ORAL_TABLET | Freq: Three times a day (TID) | ORAL | Status: DC
  Administered 2016-10-08 – 2016-10-12 (×11): 50 mg via ORAL
  Filled 2016-10-08 (×13): qty 1

## 2016-10-08 MED ORDER — ENOXAPARIN SODIUM 30 MG/0.3ML ~~LOC~~ SOLN: 30.0000 mg | SUBCUTANEOUS | Status: DC

## 2016-10-08 MED ORDER — ORAL CARE MOUTH RINSE
15.0000 mL | Freq: Two times a day (BID) | OROMUCOSAL | Status: DC
  Administered 2016-10-09 – 2016-10-18 (×16): 15 mL via OROMUCOSAL

## 2016-10-08 MED ORDER — GABAPENTIN 100 MG PO CAPS
100.0000 mg | ORAL_CAPSULE | Freq: Three times a day (TID) | ORAL | Status: DC | PRN
  Administered 2016-10-08 – 2016-10-11 (×4): 100 mg via ORAL
  Filled 2016-10-08 (×4): qty 1

## 2016-10-08 NOTE — Care Management Obs Status (Signed)
[facility] NOTIFICATION   Patient Details  Name: Juan [name] MRN: [id number] Date of Birth: [birth year]   Medicare Observation Status Notification Given:  Yes    Herron Fero, Rory Percy, RN 10/08/2016, 4:36 PM

## 2016-10-08 NOTE — Progress Notes (Signed)
Visited with patient per [facility] Consult to provide prayer.  Patient was specific in his prayer request.. Prayer was done and provided empathetic listening and presence. Patient request continue prayer.  Chaplain available as needed.

## 2016-10-08 NOTE — Progress Notes (Signed)
PROGRESS NOTE    Juan [name]  CBJ:[id number] DOB: March 16, 1941 DOA: 10/08/2016 PCP: Colon Branch, MD   Brief Narrative: 76 y.o. male with a past medical history significant for CHF EF 55%, CKD III baseline Cr 1.4, OSA not on CPAP, and myeloproliferative D/o who presents with left arm pain, found incidentally to have CHF flare, fever.  In the ER patient was found to have temperature of 100.4, oxygen saturation of 89% on room air, UA with UTI.  Assessment & Plan:  #Acute on chronic diastolic congestive heart failure: Patient with leg swelling, elevated BNP, orthopnea. Chest x-ray with acute pulmonary edema. -Continue Lasix IV, monitor electrolytes, daily weight. - On aspirin, Coreg, hydralazine, Isordil -Had an echo on 12/24/2015 showed normal left ventricular systolic function with a grade 1 diastolic dysfunction.  #Fever likely due to UTI: Patient reported dysuria which is chronic. Follow up culture results. Continue ceftriaxone. Pro-calcitonin not elevated  #Chronic kidney disease stage III: Monitor serum creatinine level. Avoid nephrotoxins.  #Left upper extremity pain and numbness: Patient reported neck pain as well. Denied injury. Doppler ultrasound pending. I ordered MRI of the cervical spine to further evaluate the patient.  #History of myeloproliferative disorder: Chin by oncologist in the past. Recommended outpatient follow-up.  Principal Problem:   Acute on chronic diastolic CHF (congestive heart failure) (HCC) Active Problems:   CKD (chronic kidney disease) stage 3, GFR 30-59 ml/min   Agnogenic myeloid metaplasia (HCC)   Type II diabetes mellitus (HCC)   Fever   Left arm pain  DVT prophylaxis: Lovenox subcutaneous Code Status: Full code Family Communication: No family at bedside Disposition Plan: Likely discharge home in 1-2 days. Order PT OT    Consultants:   None  Procedures: None Antimicrobials: Ceftriaxone since 5/24  Subjective: Seen and examined at bedside.  Reported left upper extremity pain and numbness not improving. Also has neck pain. Denied injury or fall. No chest pain. Shortness of breath better. Has chronic dysuria.  Objective: Vitals:   10/08/16 0630 10/08/16 0645 10/08/16 0700 10/08/16 0832  BP: (!) 156/80 (!) 141/82 (!) 143/76 (!) 155/74  Pulse: 87 85 84 78  Resp: (!) 21 18 18 18   Temp:    98.5 F (36.9 C)  TempSrc:    Oral  SpO2: 95% 96% 96% 96%  Weight:    89.3 kg (196 lb 12.8 oz)  Height:    6\' 3"  (1.905 m)    Intake/Output Summary (Last 24 hours) at 10/08/16 1439 Last data filed at 10/08/16 1100  Gross per 24 hour  Intake              560 ml  Output              200 ml  Net              360 ml   Filed Weights   10/08/16 0419 10/08/16 0832  Weight: 92.5 kg (204 lb) 89.3 kg (196 lb 12.8 oz)    Examination:  General exam: Appears calm and comfortable  Respiratory system: Bibasal fine crackles. Respiratory effort normal.  Cardiovascular system: S1 & S2 heard, RRR.   Gastrointestinal system: Abdomen is nondistended, soft and nontender. Normal bowel sounds heard. Central nervous system: Alert and oriented. No focal neurological deficits. Extremities:  Lower extremities edema. Left upper extremity reduced motion because of pain Skin: No rashes, lesions or ulcers Psychiatry: Judgement and insight appear normal. Mood & affect appropriate.     Data Reviewed: I have personally reviewed  following labs and imaging studies  CBC:  Recent Labs Lab 10/08/16 0435  WBC 10.1  NEUTROABS 6.2  HGB 10.6*  HCT 33.7*  MCV 81.2  PLT 836*   Basic Metabolic Panel:  Recent Labs Lab 10/02/16 1218 10/08/16 0435  NA 141 138  K 4.4 4.4  CL 107 106  CO2 27 22  GLUCOSE 106* 109*  BUN 58* 53*  CREATININE 1.35* 1.41*  CALCIUM 10.2 9.8   GFR: Estimated Creatinine Clearance: 54.9 mL/min (A) (by C-G formula based on SCr of 1.41 mg/dL (H)). Liver Function Tests:  Recent Labs Lab 10/08/16 0435  AST 32  ALT 15*  ALKPHOS  104  BILITOT 0.6  PROT 6.0*  ALBUMIN 3.2*   No results for input(s): LIPASE, AMYLASE in the last 168 hours. No results for input(s): AMMONIA in the last 168 hours. Coagulation Profile: No results for input(s): INR, PROTIME in the last 168 hours. Cardiac Enzymes: No results for input(s): CKTOTAL, CKMB, CKMBINDEX, TROPONINI in the last 168 hours. BNP (last 3 results) No results for input(s): PROBNP in the last 8760 hours. HbA1C: No results for input(s): HGBA1C in the last 72 hours. CBG: No results for input(s): GLUCAP in the last 168 hours. Lipid Profile: No results for input(s): CHOL, HDL, LDLCALC, TRIG, CHOLHDL, LDLDIRECT in the last 72 hours. Thyroid Function Tests: No results for input(s): TSH, T4TOTAL, FREET4, T3FREE, THYROIDAB in the last 72 hours. Anemia Panel: No results for input(s): VITAMINB12, FOLATE, FERRITIN, TIBC, IRON, RETICCTPCT in the last 72 hours. Sepsis Labs:  Recent Labs Lab 10/08/16 0435 10/08/16 0438  PROCALCITON 0.14  --   LATICACIDVEN  --  0.69    No results found for this or any previous visit (from the past 240 hour(s)).       Radiology Studies: Dg Chest 2 View  Result Date: 10/08/2016 CLINICAL DATA:  Fever, cough, shortness of breath, numbness down the left arm. EXAM: CHEST  2 VIEW COMPARISON:  05/05/2016 FINDINGS: Shallow inspiration with atelectasis in the lung bases. Cardiac enlargement with mild pulmonary vascular congestion. Hazy perihilar opacities may represent mild pulmonary edema. No pleural effusions. No pneumothorax. Mild thoracic scoliosis convex towards the right. IMPRESSION: Cardiac enlargement with mild pulmonary vascular congestion and probable mild perihilar edema. Shallow inspiration with atelectasis in the lung bases. Electronically Signed   By: Lucienne Capers M.D.   On: 10/08/2016 04:56        Scheduled Meds: . aspirin  325 mg Oral Daily  . carvedilol  3.125 mg Oral BID WC  . enoxaparin (LOVENOX) injection  30 mg  Subcutaneous Q24H  . furosemide  80 mg Intravenous BID  . hydrALAZINE  50 mg Oral TID  . HYDROcodone-acetaminophen  1 tablet Oral Q8H  . isosorbide dinitrate  30 mg Oral BID  . potassium chloride  40 mEq Oral BID   Continuous Infusions: . [START ON 10/09/2016] cefTRIAXone (ROCEPHIN)  IV       LOS: 0 days    Alvenia Treese Tanna Furry, MD [facility] Pager [phone]  If 7PM-7AM, please contact night-coverage [email or web address] Password [email or web address] 10/08/2016, 2:39 PM

## 2016-10-08 NOTE — H&P (Signed)
History and Physical  Patient Name: Juan [name]     ZTI:[id number]    DOB: 1940/10/30    DOA: 10/08/2016 PCP: Colon Branch, MD   Patient coming from: Home  Chief Complaint: Left arm pain  HPI: Juan [name] is a 76 y.o. male with a past medical history significant for CHF EF 55% noncompliant, CKD III baseline Cr 1.4, OSA not on CPAP, and myeloproliferative D/o who presents with left arm pain, found incidentally to have CHF flare, fever.  The patient first noticed swelling in his left hand a few days ago and some left hand discomfort although this wasn't bad enough to seek care until tonight at 3 AM, when the patient was awakened with severe left hand, arm and left shoulder pain, aching in character and severe, associated with mild swelling so he had his son called 9-1-1.  ED course: -Temp 100.35F, heart rate 89, respirations normal, blood pressure 186/88, pulse ox 89% on room air -Na 138, K 4.4, Cr 1.41 (baseline 1.4), WBC 10.1K, Hgb 10.6 and normocytic -Urinalysis, no nitrites, WBC TNTC -BNP 328 -Lactate 0.69 -Troponin negative -Blood and urine cultures were obtained -Chest x-ray showed bilateral perihilar opacities, edema favored over pneumonia -EDP wasn't sure if he had pneumonia or UTI and so gave ceftriaxone and azithromycin and asked TRH to evaluate for left arm pain    With regard to left arm pain, he is mostly sedentary, wheelchair-bound or using a walker, has not done any extra work with his arm recently. There is mild swelling. There is no focal tenderness on my exam. It hurts with movement.  With regard to fever, the patient tells me that he has intermittent fevers for over a year. He also does say he has dysuria, meaning he has chronic dysuria since having a foley in January.  He says he has cough and purulent sputum, meaning he has a chronic sputum production for >1 year.  Also, he does have some chronic myeloproliferative d/o NOS (BCR/ABL, JAK2 negative) for which he used to follow  with Dr. Marin Olp but no longer does because his quality of life was declining and he did not want further testing (although all of Dr. 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    With regard to hypoxia and edema on CXR, he notes h is weight is up to 205 lbs this week from his baseline 195-198 lbs and he has had worse orthopnea and PND and leg swelling in the last week.     ROS: Review of Systems  Constitutional: Positive for chills and fever.  Respiratory: Positive for cough.   Cardiovascular: Positive for orthopnea, leg swelling and PND.  Musculoskeletal: Positive for joint pain (left shoulder, hand) and neck pain.  All other systems reviewed and are negative.         Past Medical History:  Diagnosis Date  . Allergic rhinitis   . Anemia   . Ascending aortic aneurysm ([place]) 03/2014   4.3cm on CT scan  . CAD (coronary artery disease)    dx elsewheer in past, no documentation. Non-ischemic myoview 2007  . Chronic diastolic CHF (congestive heart failure), NYHA class 2 (HCC)    Normal EF w/ grade 1 dd by echo 12/2015  . Edema    R>L leg, u/s 5-12 neg for DVT  . Hemorrhoid   . History of thrombocytosis   . Hypertension   . Migraine    "once/wk at least" (  07/11/2013)  . Myeloproliferative disease ([place])   . Shortness of breath   . Sinus congestion   . Sleep apnea, obstructive    at some point used CPAP, was d/c  years ago  . Type II diabetes mellitus ([place])     Past Surgical History:  Procedure Laterality Date  . TOE SURGERY Right    "tried to straighten out big toe" (07/11/2013)    Social History: Patient lives with his son.  The patient walks with a cane, walker, or wheelchair.  Nonsmoker.  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    No Known Allergies  Family history: family history includes Schizophrenia in his son.  Prior to Admission medications   Medication Sig Start  Date End Date Taking? Authorizing Provider  acetaminophen (TYLENOL) 500 MG tablet Take 500-1,000 mg by mouth every 6 (six) hours as needed.   Yes [provider]  aspirin 325 MG tablet Take 325 mg by mouth daily.   Yes [provider]  carvedilol (COREG) 3.125 MG tablet Take 1 tablet (3.125 mg total) by mouth 2 (two) times daily with a meal. 10/02/16  Yes Tillery, Satira Mccallum, PA-C  dextromethorphan (DELSYM) 30 MG/5ML liquid Take 5 mLs (30 mg total) by mouth at bedtime as needed for cough. 05/12/16  Yes Elgergawy, Silver Huguenin, MD  furosemide (LASIX) 40 MG tablet Take 1-2 tablets (40-80 mg total) by mouth as directed. 80 MG (2 TABS) IN THE AM AND 40 MG (1 TAB) IN THE PM 09/24/16  Yes Jettie Booze E, NP  hydrALAZINE (APRESOLINE) 50 MG tablet Take 1 tablet (50 mg total) by mouth 3 (three) times daily. 10/02/16  Yes Shirley Friar, PA-C  isosorbide dinitrate (ISORDIL) 30 MG tablet Take 1 tablet (30 mg total) by mouth 2 (two) times daily. 10/02/16  Yes Shirley Friar, PA-C  metolazone (ZAROXOLYN) 2.5 MG tablet Take 1 tablet (2.5 mg total) by mouth as directed. Patient taking differently: Take 2.5 mg by mouth See admin instructions. When instructed by MD to take 09/24/16 09/24/17 Yes Arbutus Leas, NP  potassium chloride SA (K-DUR,KLOR-CON) 20 MEQ tablet Take 1 tablet (20 mEq total) by mouth as directed. WITH METOLAZONE DOSE ONLY 09/24/16  Yes Arbutus Leas, NP  Amino Acids-Protein Hydrolys (FEEDING SUPPLEMENT, PRO-STAT SUGAR FREE 64,) LIQD Take 30 mLs by mouth 2 (two) times daily.    [provider]  B Complex Vitamins (VITAMIN B COMPLEX PO) Take 100 mg by mouth daily.    [provider]  bisacodyl (DULCOLAX) 10 MG suppository Place 1 suppository (10 mg total) rectally daily as needed for moderate constipation. 04/18/16   Hongalgi, Lenis Dickinson, MD  diclofenac sodium (VOLTAREN) 1 % GEL Apply 4 g topically 2 (two) times daily. Apply to bilateral  knees    [provider]  feeding supplement, GLUCERNA SHAKE, (GLUCERNA SHAKE) LIQD Take 237 mLs by mouth daily. Reported on 08/21/2015    [provider]  FERROUS GLUCONATE PO Take 240 mg by mouth daily at 10 pm. 1 tablet    [provider]  fluticasone (FLONASE) 50 MCG/ACT nasal spray Place 2 sprays into both nostrils daily. Reported on 05/02/2015    [provider]  gabapentin (NEURONTIN) 100 MG capsule Take 100 mg by mouth 3 (three) times daily as needed (pain). Reported on 08/22/2015    [provider]  guaiFENesin (MUCINEX) 600 MG 12 hr tablet Take 2 tablets (1,200 mg total) by mouth 2 (  two) times daily. 04/04/16   Verlee Monte, MD  hydrocerin (EUCERIN) CREA Apply Eucerin cream to BLE Q day after bathing and roughly towel drying to remove loose skin 01/27/14   Ghimire, Henreitta Leber, MD  HYDROcodone-acetaminophen (NORCO/VICODIN) 5-325 MG tablet Take 1 tablet by mouth every 8 (eight) hours. routinely 05/12/16   Elgergawy, Silver Huguenin, MD  simethicone (MYLICON) 80 MG chewable tablet Chew 80 mg by mouth every 6 (six) hours as needed for flatulence.    [provider]       Physical Exam: BP (!) 163/79   Pulse 84   Temp (!) 100.4 F (38 C) (Rectal)   Resp 20   Ht 6\' 3"  (1.905 m)   Wt 92.5 kg (204 lb)   SpO2 96%   BMI 25.50 kg/m  General appearance: Well-developed, thin elderly adult male, alert and in no acute distress.   Eyes: Anicteric, conjunctiva pink, lids and lashes normal. PERRL.    ENT: No nasal deformity, discharge, epistaxis.  Hearing normal. OP moist without lesions.   Neck: No neck masses.  Trachea midline.  No thyromegaly/tenderness. Lymph: No cervical or supraclavicular lymphadenopathy. Skin: Warm and dry.  No jaundice.  No suspicious rashes or lesions. Cardiac: RRR, nl W4-X3, soft systolic mrumu.  Capillary refill is brisk.  JVP not visible.  Bilateral 2+ pitting LE edema.  Radial pulses 2+ and symmetric. Respiratory: Normal respiratory rate  and rhythm.  No wheezes, I do not appreciate rales. Abdomen: Abdomen soft.  No TTP. No ascites, distension, hepatosplenomegaly.   MSK: No deformities or effusions.  No cyanosis or clubbing.  There is mild swelling of the left hand relative to the right.  I do not note redness/skin infection, and I cannot find any point tenderness. He is globally weak, seems to be in pain with movement of the left arm. Neuro: Cranial nerves 3-12 intact.  Sensation intact to light touch. Speech is fluent.  Muscle strength 4/5 bilaterally, I do not note a difference in arm strength, he is just diffusely debilitated and weak.    Psych: Sensorium intact and responding to questions, attention normal.  Behavior appropriate.  Affect blunted.  Judgment and insight appear poor.     Labs on Admission:  I have personally reviewed following labs and imaging studies: CBC:  Recent Labs Lab 10/08/16 0435  WBC 10.1  NEUTROABS 6.2  HGB 10.6*  HCT 33.7*  MCV 81.2  PLT 244*   Basic Metabolic Panel:  Recent Labs Lab 10/02/16 1218 10/08/16 0435  NA 141 138  K 4.4 4.4  CL 107 106  CO2 27 22  GLUCOSE 106* 109*  BUN 58* 53*  CREATININE 1.35* 1.41*  CALCIUM 10.2 9.8   GFR: Estimated Creatinine Clearance: 54.9 mL/min (A) (by C-G formula based on SCr of 1.41 mg/dL (H)).  Liver Function Tests:  Recent Labs Lab 10/08/16 0435  AST 32  ALT 15*  ALKPHOS 104  BILITOT 0.6  PROT 6.0*  ALBUMIN 3.2*   No results for input(s): LIPASE, AMYLASE in the last 168 hours. No results for input(s): AMMONIA in the last 168 hours. Coagulation Profile: No results for input(s): INR, PROTIME in the last 168 hours. Cardiac Enzymes: No results for input(s): CKTOTAL, CKMB, CKMBINDEX, TROPONINI in the last 168 hours. BNP (last 3 results) No results for input(s): PROBNP in the last 8760 hours. HbA1C: No results for input(s): HGBA1C in the last 72 hours. CBG: No results for input(s): GLUCAP in the last 168 hours. Lipid  Profile: No  results for input(s): CHOL, HDL, LDLCALC, TRIG, CHOLHDL, LDLDIRECT in the last 72 hours. Thyroid Function Tests: No results for input(s): TSH, T4TOTAL, FREET4, T3FREE, THYROIDAB in the last 72 hours. Anemia Panel: No results for input(s): VITAMINB12, FOLATE, FERRITIN, TIBC, IRON, RETICCTPCT in the last 72 hours. Sepsis Labs: Lactic acid 0.69 Invalid input(s): PROCALCITONIN, LACTICIDVEN No results found for this or any previous visit (from the past 240 hour(s)).       Radiological Exams on Admission: Personally reviewed CXR shows perihilar opacities, favor edema: Dg Chest 2 View  Result Date: 10/08/2016 CLINICAL DATA:  Fever, cough, shortness of breath, numbness down the left arm. EXAM: CHEST  2 VIEW COMPARISON:  05/05/2016 FINDINGS: Shallow inspiration with atelectasis in the lung bases. Cardiac enlargement with mild pulmonary vascular congestion. Hazy perihilar opacities may represent mild pulmonary edema. No pleural effusions. No pneumothorax. Mild thoracic scoliosis convex towards the right. IMPRESSION: Cardiac enlargement with mild pulmonary vascular congestion and probable mild perihilar edema. Shallow inspiration with atelectasis in the lung bases. Electronically Signed   By: Lucienne Capers M.D.   On: 10/08/2016 04:56    EKG: Independently reviewed. Rate 84, QTc 447, normal sinus rhythm.  Echocardiogram 12/2015: EF 55-60% Grade I DD No significant valvular disease Report reviewed           Assessment/Plan  1. Acute on chronic diastolic CHF:  Elevated BNP, leg swelling, worse orthopnea and nocturnal dyspnea lately with edema on CXR.   -Furosemide 80 mg BID -K suppl -Strict I/Os -Daily weights -Continue BB, Isordil, hydralazine -Continue aspirin 325   2. Fever:  Possibly from UTI, although he is asymptomatic.  He remains at risk for malignant transformation of his myeloproliferative disorder.  -Check procalcitonin -Follow urine and blood  cultures  3. Left arm pain:  Musculoskeletal vs DVT (given risk for thrombosis with myeloid dysp + swelling).  Less likely cervical disc disease or abscess (given fever) -RUE US doppler ordered -If negative proceed to MR  4. Other medications:  -Continue gabapentin -Continue Norco  5. CKD 3:  Stable at baseline, creatinine 1.4  6. Myeloproliferative disorder:  Undifferentiated, has not seen Dr. Marin Olp in 2 years.  Anemia and thrombocytosis are stable at his baseline.       DVT prophylaxis: Lovenox  Code Status: FULL  Family Communication: None present  Disposition Plan: Anticipate IV diuresis and RUE [place].   Consults called: None Admission status: OBS At the point of initial evaluation, it is my clinical opinion that admission for OBSERVATION is reasonable and necessary because the patient's presenting complaints in the context of their chronic conditions represent sufficient risk of deterioration or significant morbidity to constitute reasonable grounds for close observation in the hospital setting, but that the patient may be medically stable for discharge from the hospital within 24 to 48 hours.    Medical decision making: Patient seen at 6:00 AM on 10/08/2016.  The patient was discussed with Dr. Betsey Holiday.  What exists of the patient's chart was reviewed in depth and summarized above.  Clinical condition: stable.        Edwin Dada [facility] Pager [phone]

## 2016-10-08 NOTE — Progress Notes (Signed)
Received report from ED RN. Room ready for patient. Laquiesha Piacente Joselita, RN 

## 2016-10-08 NOTE — ED Notes (Signed)
Patient transported to X-ray 

## 2016-10-08 NOTE — ED Triage Notes (Signed)
Pt arrived EMS from home, where he lives with his son, with reports of left arm pain, chills, and a "sometimes productive cough"

## 2016-10-08 NOTE — ED Provider Notes (Signed)
[facility] DEPT Provider Note   CSN: [id number] Arrival date & time: 10/08/16  0407     History   Chief Complaint Chief Complaint  Patient presents with  . Arm Pain  . Chills    HPI Juan [name] is a 77 y.o. male.  Patient presents with complaints of multiple problems. He reports that he has been having pain on the left side of his neck, left shoulder, down his left arm. This started 3 days ago and has progressively worsened. He denies any injury to the area. He feels like his arm and hand are swollen.  Patient reports that he started having chills tonight. This has been intermittent. He has had a cough with some chest congestion and bringing up some sputum.      Past Medical History:  Diagnosis Date  . Allergic rhinitis   . Anemia   . Ascending aortic aneurysm ([place]) 03/2014   4.3cm on CT scan  . CAD (coronary artery disease)    dx elsewheer in past, no documentation. Non-ischemic myoview 2007  . Chronic diastolic CHF (congestive heart failure), NYHA class 2 (HCC)    Normal EF w/ grade 1 dd by echo 12/2015  . Edema    R>L leg, u/s 5-12 neg for DVT  . Hemorrhoid   . History of thrombocytosis   . Hypertension   . Migraine    "once/wk at least" (07/11/2013)  . Myeloproliferative disease ([place])   . Shortness of breath   . Sinus congestion   . Sleep apnea, obstructive    at some point used CPAP, was d/c  years ago  . Type II diabetes mellitus [facility])     Patient Active Problem List   Diagnosis Date Noted  . CHF (congestive heart failure) ([place]) 05/06/2016  . Paroxysmal SVT (supraventricular tachycardia) ([place]) 04/20/2016  . Type II diabetes mellitus ([place]) 04/20/2016  . Gout 04/20/2016  . Hyperuricemia 04/20/2016  . B12 deficiency 04/20/2016  . 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 Hydronephrosis   . Thrombocytosis ([place]) 04/07/2016  . Anemia 04/07/2016  . Pneumonia 04/01/2016  . Pressure injury of skin 04/01/2016  . Pressure ulcer stage II  12/31/2015  . UTI (urinary tract infection) 12/30/2015  . Sepsis ([place]) 12/29/2015  . UTI (lower urinary tract infection) 12/29/2015  . T wave inversion in EKG 12/29/2015  . PCP NOTES >>>>>>>>>>>>>>>>> 05/02/2015  . Pain in limb   . Peripheral edema   . Unable to ambulate   . Thoracic aortic aneurysm ([place]) 04/26/2014  . CKD (chronic kidney disease) stage 3, GFR 30-59 ml/min 04/26/2014  . 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 Chest pain 04/15/2014  . Encounter for palliative care 04/06/2014  . Weakness generalized 04/06/2014  . Venous stasis dermatitis of both lower extremities   . Malnutrition of moderate degree ([place]) 03/31/2014  . Morbid obesity due to excess calories ([place]) 03/29/2014  . 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 Poor compliance with advise  05/05/2012  . Annual physical exam 01/14/2012  . Weight loss 01/14/2012  . BPH (benign prostatic hyperplasia) 01/14/2012  . *Chronic lower extremity edema, stasis dermatitis 06/09/2011  . CARDIOMEGALY 04/29/2009  . *Prediabetes 09/07/2006  . Symptomatic anemia 09/07/2006  . Myeloproliferative disease ([place]) 09/07/2006  . Obstructive sleep apnea 09/07/2006  . Essential hypertension 09/07/2006  . Coronary atherosclerosis 09/07/2006  . *CHF-- PCP comments 09/07/2006  . Allergic rhinitis 09/07/2006    Past Surgical History:  Procedure Laterality Date  . TOE SURGERY Right    "  tried to straighten out big toe" (07/11/2013)       Home Medications    Prior to Admission medications   Medication Sig Start Date End Date Taking? Authorizing Provider  acetaminophen (TYLENOL) 500 MG tablet Take 500-1,000 mg by mouth every 6 (six) hours as needed.    [provider]  Amino Acids-Protein Hydrolys (FEEDING SUPPLEMENT, PRO-STAT SUGAR FREE 64,) LIQD Take 30 mLs by mouth 2 (two) times daily.    [provider]  aspirin 325 MG tablet Take 325 mg by mouth daily.    [provider]  B  Complex Vitamins (VITAMIN B COMPLEX PO) Take 100 mg by mouth daily.    [provider]  bisacodyl (DULCOLAX) 10 MG suppository Place 1 suppository (10 mg total) rectally daily as needed for moderate constipation. 04/18/16   Modena Jansky, MD  carvedilol (COREG) 3.125 MG tablet Take 1 tablet (3.125 mg total) by mouth 2 (two) times daily with a meal. 10/02/16   Tillery, Satira Mccallum, PA-C  dextromethorphan (DELSYM) 30 MG/5ML liquid Take 5 mLs (30 mg total) by mouth at bedtime as needed for cough. 05/12/16   Elgergawy, Silver Huguenin, MD  diclofenac sodium (VOLTAREN) 1 % GEL Apply 4 g topically 2 (two) times daily. Apply to bilateral  knees    [provider]  feeding supplement, GLUCERNA SHAKE, (GLUCERNA SHAKE) LIQD Take 237 mLs by mouth daily. Reported on 08/21/2015    [provider]  FERROUS GLUCONATE PO Take 240 mg by mouth daily at 10 pm. 1 tablet    [provider]  fluticasone (FLONASE) 50 MCG/ACT nasal spray Place 2 sprays into both nostrils daily. Reported on 05/02/2015    [provider]  furosemide (LASIX) 40 MG tablet Take 1-2 tablets (40-80 mg total) by mouth as directed. 80 MG (2 TABS) IN THE AM AND 40 MG (1 TAB) IN THE PM 09/24/16   Arbutus Leas, NP  gabapentin (NEURONTIN) 100 MG capsule Take 100 mg by mouth 3 (three) times daily as needed (pain). Reported on 08/22/2015    [provider]  guaiFENesin (MUCINEX) 600 MG 12 hr tablet Take 2 tablets (1,200 mg total) by mouth 2 (two) times daily. 04/04/16   Verlee Monte, MD  hydrALAZINE (APRESOLINE) 50 MG tablet Take 1 tablet (50 mg total) by mouth 3 (three) times daily. 10/02/16   Shirley Friar, PA-C  hydrocerin (EUCERIN) CREA Apply Eucerin cream to BLE Q day after bathing and roughly towel drying to remove loose skin 01/27/14   Ghimire, Henreitta Leber, MD  HYDROcodone-acetaminophen (NORCO/VICODIN) 5-325 MG tablet Take 1 tablet by mouth every 8 (eight) hours. routinely 05/12/16   Elgergawy,  Silver Huguenin, MD  isosorbide dinitrate (ISORDIL) 30 MG tablet Take 1 tablet (30 mg total) by mouth 2 (two) times daily. 10/02/16   Shirley Friar, PA-C  metolazone (ZAROXOLYN) 2.5 MG tablet Take 1 tablet (2.5 mg total) by mouth as directed. 09/24/16 09/24/17  Arbutus Leas, NP  potassium chloride SA (K-DUR,KLOR-CON) 20 MEQ tablet Take 1 tablet (20 mEq total) by mouth as directed. WITH METOLAZONE DOSE ONLY 09/24/16   Arbutus Leas, NP  simethicone (MYLICON) 80 MG chewable tablet Chew 80 mg by mouth every 6 (six) hours as needed for flatulence.    [provider]    Family History Family History  Problem Relation Age of Onset  . Schizophrenia Son   . Colon cancer Neg Hx   . Prostate cancer Neg Hx   .  Heart attack Neg Hx   . Diabetes Neg Hx     Social History Social History  Substance Use Topics  . Smoking status: Former Smoker    Packs/day: 0.25    Years: 12.00    Types: Cigarettes    Quit date: 05/18/1966  . Smokeless tobacco: Never Used     Comment: quit smoking 45 years ago  . Alcohol use No     Allergies   Patient has no known allergies.   Review of Systems Review of Systems  Constitutional: Positive for chills.  Respiratory: Positive for cough and shortness of breath.   Musculoskeletal: Positive for arthralgias.  All other systems reviewed and are negative.    Physical Exam Updated Vital Signs BP (!) 164/82   Pulse 86   Temp (!) 100.4 F (38 C) (Rectal)   Resp 18   Ht 6\' 3"  (1.905 m)   Wt 92.5 kg (204 lb)   SpO2 96%   BMI 25.50 kg/m   Physical Exam  Constitutional: He is oriented to person, place, and time. He appears well-developed and well-nourished. No distress.  HENT:  Head: Normocephalic and atraumatic.  Right Ear: Hearing normal.  Left Ear: Hearing normal.  Nose: Nose normal.  Mouth/Throat: Oropharynx is clear and moist and mucous membranes are normal.  Eyes: Conjunctivae and EOM are normal. Pupils are equal, round, and reactive to  light.  Neck: Normal range of motion. Neck supple.  Cardiovascular: Regular rhythm, S1 normal and S2 normal.  Exam reveals no gallop and no friction rub.   No murmur heard. Pulmonary/Chest: Accessory muscle usage present. No respiratory distress. He has decreased breath sounds. He has rales. He exhibits no tenderness.  Abdominal: Soft. Normal appearance and bowel sounds are normal. There is no hepatosplenomegaly. There is no tenderness. There is no rebound, no guarding, no tenderness at McBurney's point and negative Murphy's sign. No hernia.  Musculoskeletal:       Left shoulder: He exhibits decreased range of motion and tenderness.  Neurological: He is alert and oriented to person, place, and time. He has normal strength. No cranial nerve deficit or sensory deficit. Coordination normal. GCS eye subscore is 4. GCS verbal subscore is 5. GCS motor subscore is 6.  Skin: Skin is warm, dry and intact. No rash noted. No cyanosis.  Psychiatric: He has a normal mood and affect. His speech is normal and behavior is normal. Thought content normal.  Nursing note and vitals reviewed.    ED Treatments / Results  Labs (all labs ordered are listed, but only abnormal results are displayed) Labs Reviewed  COMPREHENSIVE METABOLIC PANEL - Abnormal; Notable for the following:       Result Value   Glucose, Bld 109 (*)    BUN 53 (*)    Creatinine, Ser 1.41 (*)    Total Protein 6.0 (*)    Albumin 3.2 (*)    ALT 15 (*)    GFR calc non Af Amer 48 (*)    GFR calc Af Amer 55 (*)    All other components within normal limits  CBC WITH DIFFERENTIAL/PLATELET - Abnormal; Notable for the following:    RBC 4.15 (*)    Hemoglobin 10.6 (*)    HCT 33.7 (*)    MCH 25.5 (*)    RDW 18.7 (*)    Platelets 618 (*)    All other components within normal limits  URINALYSIS, ROUTINE W REFLEX MICROSCOPIC - Abnormal; Notable for the following:    Protein, ur >=  300 (*)    Leukocytes, UA LARGE (*)    Bacteria, UA RARE (*)     Squamous Epithelial / LPF 0-5 (*)    All other components within normal limits  BRAIN NATRIURETIC PEPTIDE - Abnormal; Notable for the following:    B Natriuretic Peptide 328.5 (*)    All other components within normal limits  CULTURE, BLOOD (ROUTINE X 2)  CULTURE, BLOOD (ROUTINE X 2)  URINE CULTURE  I-STAT CG4 LACTIC ACID, ED  I-STAT TROPOININ, ED    EKG  EKG Interpretation  Date/Time:  Thursday Oct 08 2016 04:13:50 EDT Ventricular Rate:  84 PR Interval:    QRS Duration: 109 QT Interval:  378 QTC Calculation: 447 R Axis:   -31 Text Interpretation:  Sinus rhythm Prolonged PR interval Left ventricular hypertrophy Nonspecific T abnormalities, anterior leads Confirmed by Orpah Greek [phone]) on 10/08/2016 4:20:57 AM       Radiology Dg Chest 2 View  Result Date: 10/08/2016 CLINICAL DATA:  Fever, cough, shortness of breath, numbness down the left arm. EXAM: CHEST  2 VIEW COMPARISON:  05/05/2016 FINDINGS: Shallow inspiration with atelectasis in the lung bases. Cardiac enlargement with mild pulmonary vascular congestion. Hazy perihilar opacities may represent mild pulmonary edema. No pleural effusions. No pneumothorax. Mild thoracic scoliosis convex towards the right. IMPRESSION: Cardiac enlargement with mild pulmonary vascular congestion and probable mild perihilar edema. Shallow inspiration with atelectasis in the lung bases. Electronically Signed   By: Lucienne Capers M.D.   On: 10/08/2016 04:56    Procedures Procedures (including critical care time)  Medications Ordered in ED Medications  cefTRIAXone (ROCEPHIN) 1 g in dextrose 5 % 50 mL IVPB (not administered)  acetaminophen (TYLENOL) tablet 650 mg (not administered)  azithromycin (ZITHROMAX) 500 mg in dextrose 5 % 250 mL IVPB (not administered)     Initial Impression / Assessment and Plan / ED Course  I have reviewed the triage vital signs and the nursing notes.  Pertinent labs & imaging results that were  available during my care of the patient were reviewed by me and considered in my medical decision making (see chart for details).     Patient presented with multiple complaints. He is here primarily for evaluation of pain in the area of his left shoulder that radiates down his arm. He does have tenderness in this area without deformity. Pain is reproducible with palpation as well as movement of the arm and shoulder, seems musculoskeletal in nature. Cardiac evaluation as far as EKG and troponin are negative thus far. Patient noted to be short of breath arrival with hypoxia. He does not use oxygen at home. He does have a history of congestive heart failure. BNP is slightly elevated at 328. Chest x-ray shows congestion without obvious edema. Patient reports that he has had cough and chest congestion.  Patient noted to have fever at arrival. His lactate is normal, no significant white blood cell count elevation. No evidence of severe sepsis at this time. Source of fever appears to be urine, as he has evidence of urinary tract infection. He was administered IV Rocephin for this. As he has had cough, chest congestion and shortness of breath with hypoxia, Zithromax added empirically for community-acquired pneumonia coverage.  Final Clinical Impressions(s) / ED Diagnoses   Final diagnoses:  Urinary tract infection without hematuria, site unspecified  Upper respiratory tract infection, unspecified type  Hypoxia    New Prescriptions New Prescriptions   No medications on file     Joseph Berkshire  J, MD 10/08/16 [phone]

## 2016-10-09 ENCOUNTER — Inpatient Hospital Stay (HOSPITAL_COMMUNITY): Payer: Medicare Other

## 2016-10-09 DIAGNOSIS — M50322 Other cervical disc degeneration at C5-C6 level: Secondary | ICD-10-CM | POA: Diagnosis not present

## 2016-10-09 DIAGNOSIS — E1122 Type 2 diabetes mellitus with diabetic chronic kidney disease: Secondary | ICD-10-CM | POA: Diagnosis present

## 2016-10-09 DIAGNOSIS — M7989 Other specified soft tissue disorders: Secondary | ICD-10-CM | POA: Diagnosis not present

## 2016-10-09 DIAGNOSIS — M4803 Spinal stenosis, cervicothoracic region: Secondary | ICD-10-CM | POA: Diagnosis not present

## 2016-10-09 DIAGNOSIS — I130 Hypertensive heart and chronic kidney disease with heart failure and stage 1 through stage 4 chronic kidney disease, or unspecified chronic kidney disease: Secondary | ICD-10-CM | POA: Diagnosis present

## 2016-10-09 DIAGNOSIS — J069 Acute upper respiratory infection, unspecified: Secondary | ICD-10-CM | POA: Diagnosis not present

## 2016-10-09 DIAGNOSIS — M50323 Other cervical disc degeneration at C6-C7 level: Secondary | ICD-10-CM | POA: Diagnosis not present

## 2016-10-09 DIAGNOSIS — N189 Chronic kidney disease, unspecified: Secondary | ICD-10-CM | POA: Diagnosis not present

## 2016-10-09 DIAGNOSIS — N400 Enlarged prostate without lower urinary tract symptoms: Secondary | ICD-10-CM | POA: Diagnosis present

## 2016-10-09 DIAGNOSIS — Z993 Dependence on wheelchair: Secondary | ICD-10-CM | POA: Diagnosis not present

## 2016-10-09 DIAGNOSIS — Z22322 Carrier or suspected carrier of Methicillin resistant Staphylococcus aureus: Secondary | ICD-10-CM | POA: Diagnosis not present

## 2016-10-09 DIAGNOSIS — M79605 Pain in left leg: Secondary | ICD-10-CM | POA: Diagnosis not present

## 2016-10-09 DIAGNOSIS — K5900 Constipation, unspecified: Secondary | ICD-10-CM | POA: Diagnosis not present

## 2016-10-09 DIAGNOSIS — I5033 Acute on chronic diastolic (congestive) heart failure: Secondary | ICD-10-CM | POA: Diagnosis not present

## 2016-10-09 DIAGNOSIS — I712 Thoracic aortic aneurysm, without rupture: Secondary | ICD-10-CM | POA: Diagnosis present

## 2016-10-09 DIAGNOSIS — M75102 Unspecified rotator cuff tear or rupture of left shoulder, not specified as traumatic: Secondary | ICD-10-CM | POA: Diagnosis not present

## 2016-10-09 DIAGNOSIS — G4733 Obstructive sleep apnea (adult) (pediatric): Secondary | ICD-10-CM | POA: Diagnosis present

## 2016-10-09 DIAGNOSIS — J189 Pneumonia, unspecified organism: Secondary | ICD-10-CM | POA: Diagnosis present

## 2016-10-09 DIAGNOSIS — M50321 Other cervical disc degeneration at C4-C5 level: Secondary | ICD-10-CM | POA: Diagnosis not present

## 2016-10-09 DIAGNOSIS — D7581 Myelofibrosis: Secondary | ICD-10-CM | POA: Diagnosis not present

## 2016-10-09 DIAGNOSIS — M79609 Pain in unspecified limb: Secondary | ICD-10-CM | POA: Diagnosis not present

## 2016-10-09 DIAGNOSIS — I2721 Secondary pulmonary arterial hypertension: Secondary | ICD-10-CM | POA: Diagnosis not present

## 2016-10-09 DIAGNOSIS — I272 Pulmonary hypertension, unspecified: Secondary | ICD-10-CM | POA: Diagnosis not present

## 2016-10-09 DIAGNOSIS — N390 Urinary tract infection, site not specified: Secondary | ICD-10-CM | POA: Diagnosis not present

## 2016-10-09 DIAGNOSIS — R0902 Hypoxemia: Secondary | ICD-10-CM | POA: Diagnosis not present

## 2016-10-09 DIAGNOSIS — M25512 Pain in left shoulder: Secondary | ICD-10-CM | POA: Diagnosis not present

## 2016-10-09 DIAGNOSIS — S43422D Sprain of left rotator cuff capsule, subsequent encounter: Secondary | ICD-10-CM | POA: Diagnosis not present

## 2016-10-09 DIAGNOSIS — R079 Chest pain, unspecified: Secondary | ICD-10-CM | POA: Diagnosis not present

## 2016-10-09 DIAGNOSIS — Z818 Family history of other mental and behavioral disorders: Secondary | ICD-10-CM | POA: Diagnosis not present

## 2016-10-09 DIAGNOSIS — D731 Hypersplenism: Secondary | ICD-10-CM | POA: Diagnosis present

## 2016-10-09 DIAGNOSIS — N183 Chronic kidney disease, stage 3 (moderate): Secondary | ICD-10-CM | POA: Diagnosis not present

## 2016-10-09 DIAGNOSIS — Z7982 Long term (current) use of aspirin: Secondary | ICD-10-CM | POA: Diagnosis not present

## 2016-10-09 DIAGNOSIS — I509 Heart failure, unspecified: Secondary | ICD-10-CM | POA: Diagnosis not present

## 2016-10-09 DIAGNOSIS — I2510 Atherosclerotic heart disease of native coronary artery without angina pectoris: Secondary | ICD-10-CM | POA: Diagnosis present

## 2016-10-09 DIAGNOSIS — M25812 Other specified joint disorders, left shoulder: Secondary | ICD-10-CM | POA: Diagnosis not present

## 2016-10-09 DIAGNOSIS — I5040 Unspecified combined systolic (congestive) and diastolic (congestive) heart failure: Secondary | ICD-10-CM | POA: Diagnosis not present

## 2016-10-09 DIAGNOSIS — C946 Myelodysplastic disease, not classified: Secondary | ICD-10-CM | POA: Diagnosis present

## 2016-10-09 DIAGNOSIS — Z87891 Personal history of nicotine dependence: Secondary | ICD-10-CM | POA: Diagnosis not present

## 2016-10-09 DIAGNOSIS — J80 Acute respiratory distress syndrome: Secondary | ICD-10-CM | POA: Diagnosis not present

## 2016-10-09 DIAGNOSIS — Z7951 Long term (current) use of inhaled steroids: Secondary | ICD-10-CM | POA: Diagnosis not present

## 2016-10-09 DIAGNOSIS — E1142 Type 2 diabetes mellitus with diabetic polyneuropathy: Secondary | ICD-10-CM | POA: Diagnosis not present

## 2016-10-09 DIAGNOSIS — D649 Anemia, unspecified: Secondary | ICD-10-CM | POA: Diagnosis present

## 2016-10-09 DIAGNOSIS — M79602 Pain in left arm: Secondary | ICD-10-CM | POA: Diagnosis not present

## 2016-10-09 DIAGNOSIS — R509 Fever, unspecified: Secondary | ICD-10-CM | POA: Diagnosis not present

## 2016-10-09 DIAGNOSIS — E875 Hyperkalemia: Secondary | ICD-10-CM | POA: Diagnosis not present

## 2016-10-09 DIAGNOSIS — S43422A Sprain of left rotator cuff capsule, initial encounter: Secondary | ICD-10-CM | POA: Diagnosis not present

## 2016-10-09 DIAGNOSIS — N179 Acute kidney failure, unspecified: Secondary | ICD-10-CM | POA: Diagnosis not present

## 2016-10-09 LAB — BASIC METABOLIC PANEL
ANION GAP: 5 (ref 5–15)
BUN: 50 mg/dL — ABNORMAL HIGH (ref 6–20)
CALCIUM: 9.7 mg/dL (ref 8.9–10.3)
CO2: 26 mmol/L (ref 22–32)
CREATININE: 1.17 mg/dL (ref 0.61–1.24)
Chloride: 107 mmol/L (ref 101–111)
GFR, EST NON AFRICAN AMERICAN: 60 mL/min — AB (ref >60)
GLUCOSE: 132 mg/dL — AB (ref 65–99)
Potassium: 4.6 mmol/L (ref 3.5–5.1)
Sodium: 138 mmol/L (ref 135–145)

## 2016-10-09 LAB — CBC
HCT: 35.2 % — ABNORMAL LOW (ref 39.0–52.0)
HEMOGLOBIN: 10.8 g/dL — AB (ref 13.0–17.0)
MCH: 25.1 pg — ABNORMAL LOW (ref 26.0–34.0)
MCHC: 30.7 g/dL (ref 30.0–36.0)
MCV: 81.9 fL (ref 78.0–100.0)
PLATELETS: 627 10*3/uL — AB (ref 150–400)
RBC: 4.3 MIL/uL (ref 4.22–5.81)
RDW: 18.8 % — ABNORMAL HIGH (ref 11.5–15.5)
WBC: 11.4 10*3/uL — ABNORMAL HIGH (ref 4.0–10.5)

## 2016-10-09 LAB — URINE CULTURE

## 2016-10-09 MED ORDER — METHOCARBAMOL 500 MG PO TABS
500.0000 mg | ORAL_TABLET | Freq: Three times a day (TID) | ORAL | Status: DC
  Administered 2016-10-09 (×2): 500 mg via ORAL
  Filled 2016-10-09 (×2): qty 1

## 2016-10-09 MED ORDER — DICLOFENAC SODIUM 1 % TD GEL
2.0000 g | Freq: Two times a day (BID) | TRANSDERMAL | Status: DC
  Administered 2016-10-09 – 2016-10-11 (×5): 2 g via TOPICAL
  Filled 2016-10-09: qty 100

## 2016-10-09 NOTE — Progress Notes (Signed)
PT Cancellation Note  Patient Details Name: Juan [name] MRN: [id number] DOB: 11/03/1940   Cancelled Treatment:    Reason Eval/Treat Not Completed: Pain limiting ability to participate. Pt reports that he is still in a lot of pain in his neck. States that he is comfortable right now and doesn't want to move at this time. Asks for PT to check back tomorrow after he has been prescribed some pain medication by MD. Will check back tomorrow AM per pt request.    Scheryl Marten PT, DPT  [phone]  10/09/2016, 11:23 AM

## 2016-10-09 NOTE — Progress Notes (Signed)
PROGRESS NOTE    Juan [name]  FXT:[id number] DOB: [birth year] DOA: 10/08/2016 PCP: Colon Branch, MD   Brief Narrative: 76 y.o. male with a past medical history significant for CHF EF 55%, CKD III baseline Cr 1.4, OSA not on CPAP, and myeloproliferative D/o who presents with left arm pain, found incidentally to have CHF flare, fever.  In the ER patient was found to have temperature of 100.4, oxygen saturation of 89% on room air, UA with UTI.  Assessment & Plan:  #Acute on chronic diastolic congestive heart failure: Patient with leg swelling, elevated BNP, orthopnea. Chest x-ray with acute pulmonary edema. -Shortness of breath is better, creatinine improving. Continue IV Lasix today. Continue to monitor electrolytes. - On aspirin, Coreg, hydralazine, Isordil -Had an echo on 12/24/2015 showed normal left ventricular systolic function with a grade 1 diastolic dysfunction.  #Fever likely due to UTI: Patient reported dysuria which is chronic. Urine culture likely contaminated. Continue ceftriaxone. Pro-calcitonin not elevated  #Possible acute kidney injury on Chronic kidney disease stage III: Serum creatinine level trending down. Avoid nephrotoxins. Monitor BMP  #Left upper extremity pain and numbness: Patient reported neck pain as well. Denied injury.  -Doppler ultrasound and MRI of the cervical spine is still pending. Order diclofen gel topical. Getting x-ray of left shoulder joint. Continue supportive care.   #History of myeloproliferative disorder: seen by oncologist in the past. Recommended outpatient follow-up.  Principal Problem:   Acute on chronic diastolic CHF (congestive heart failure) (HCC) Active Problems:   CKD (chronic kidney disease) stage 3, GFR 30-59 ml/min   Agnogenic myeloid metaplasia (HCC)   Type II diabetes mellitus (HCC)   Fever   Left arm pain  DVT prophylaxis: Lovenox subcutaneous Code Status: Full code Family Communication: No family at bedside Disposition Plan:  Likely discharge home in 1-2 days. Order PT OT    Consultants:   None  Procedures: None Antimicrobials: Ceftriaxone since 5/24  Subjective: Seen and examined at bedside. Slept well last night. Still has left shoulder pain. Shortness of breath is better. Denied chest pain, nausea or vomiting. Objective: Vitals:   10/08/16 1844 10/08/16 2114 10/09/16 0530 10/09/16 0900  BP: 136/65 (!) 165/74 131/70 139/72  Pulse: 95 78 67 82  Resp: 18 16 14 12   Temp: 98.3 F (36.8 C) 98.6 F (37 C) 98.3 F (36.8 C) 99.1 F (37.3 C)  TempSrc: Oral   Oral  SpO2: 91% 97% 100% 97%  Weight:      Height:        Intake/Output Summary (Last 24 hours) at 10/09/16 1045 Last data filed at 10/09/16 1038  Gross per 24 hour  Intake              480 ml  Output             3200 ml  Net            -2720 ml   Filed Weights   10/08/16 0419 10/08/16 0832  Weight: 92.5 kg (204 lb) 89.3 kg (196 lb 12.8 oz)    Examination:  General exam: Not in distress  Respiratory system: Clear bilateral. Respiratory effort normal.  Cardiovascular system: Regular rate rhythm, S1-S2 normal   Gastrointestinal system: Abdomen is nondistended, soft and nontender. Normal bowel sounds heard. Central nervous system: Alert and oriented. No focal neurological deficits. Extremities:  Lower extremities edema. Limited motion of the left upper extremity mainly at shoulder joint due to pain. No swelling or erythema. Lower extremity chronic edema. Skin:  No rashes, lesions or ulcers Psychiatry: Judgement and insight appear normal. Mood & affect appropriate.     Data Reviewed: I have personally reviewed following labs and imaging studies  CBC:  Recent Labs Lab 10/08/16 0435 10/09/16 0437  WBC 10.1 11.4*  NEUTROABS 6.2  --   HGB 10.6* 10.8*  HCT 33.7* 35.2*  MCV 81.2 81.9  PLT 618* 161*   Basic Metabolic Panel:  Recent Labs Lab 10/02/16 1218 10/08/16 0435 10/09/16 0437  NA 141 138 138  K 4.4 4.4 4.6  CL 107 106  107  CO2 27 22 26   GLUCOSE 106* 109* 132*  BUN 58* 53* 50*  CREATININE 1.35* 1.41* 1.17  CALCIUM 10.2 9.8 9.7   GFR: Estimated Creatinine Clearance: 66.2 mL/min (by C-G formula based on SCr of 1.17 mg/dL). Liver Function Tests:  Recent Labs Lab 10/08/16 0435  AST 32  ALT 15*  ALKPHOS 104  BILITOT 0.6  PROT 6.0*  ALBUMIN 3.2*   No results for input(s): LIPASE, AMYLASE in the last 168 hours. No results for input(s): AMMONIA in the last 168 hours. Coagulation Profile: No results for input(s): INR, PROTIME in the last 168 hours. Cardiac Enzymes: No results for input(s): CKTOTAL, CKMB, CKMBINDEX, TROPONINI in the last 168 hours. BNP (last 3 results) No results for input(s): PROBNP in the last 8760 hours. HbA1C: No results for input(s): HGBA1C in the last 72 hours. CBG: No results for input(s): GLUCAP in the last 168 hours. Lipid Profile: No results for input(s): CHOL, HDL, LDLCALC, TRIG, CHOLHDL, LDLDIRECT in the last 72 hours. Thyroid Function Tests: No results for input(s): TSH, T4TOTAL, FREET4, T3FREE, THYROIDAB in the last 72 hours. Anemia Panel: No results for input(s): VITAMINB12, FOLATE, FERRITIN, TIBC, IRON, RETICCTPCT in the last 72 hours. Sepsis Labs:  Recent Labs Lab 10/08/16 0435 10/08/16 0438  PROCALCITON 0.14  --   LATICACIDVEN  --  0.69    Recent Results (from the past 240 hour(s))  Urine culture     Status: Abnormal   Collection Time: 10/08/16  4:19 AM  Result Value Ref Range Status   Specimen Description URINE, RANDOM  Final   Special Requests NONE  Final   Culture MULTIPLE SPECIES PRESENT, SUGGEST RECOLLECTION (A)  Final   Report Status 10/09/2016 FINAL  Final         Radiology Studies: Dg Chest 2 View  Result Date: 10/08/2016 CLINICAL DATA:  Fever, cough, shortness of breath, numbness down the left arm. EXAM: CHEST  2 VIEW COMPARISON:  05/05/2016 FINDINGS: Shallow inspiration with atelectasis in the lung bases. Cardiac enlargement with  mild pulmonary vascular congestion. Hazy perihilar opacities may represent mild pulmonary edema. No pleural effusions. No pneumothorax. Mild thoracic scoliosis convex towards the right. IMPRESSION: Cardiac enlargement with mild pulmonary vascular congestion and probable mild perihilar edema. Shallow inspiration with atelectasis in the lung bases. Electronically Signed   By: Lucienne Capers M.D.   On: 10/08/2016 04:56        Scheduled Meds: . aspirin  325 mg Oral Daily  . carvedilol  3.125 mg Oral BID WC  . diclofenac sodium  2 g Topical BID  . enoxaparin (LOVENOX) injection  40 mg Subcutaneous Q24H  . furosemide  80 mg Intravenous BID  . hydrALAZINE  50 mg Oral TID  . HYDROcodone-acetaminophen  1 tablet Oral Q8H  . isosorbide dinitrate  30 mg Oral BID  . mouth rinse  15 mL Mouth Rinse BID  . potassium chloride  40 mEq Oral BID  Continuous Infusions: . cefTRIAXone (ROCEPHIN)  IV Stopped (10/09/16 0936)     LOS: 0 days    Maximilian Tallo Tanna Furry, MD [facility] Pager [phone]  If 7PM-7AM, please contact night-coverage [email or web address] Password [email or web address] 10/09/2016, 10:45 AM

## 2016-10-09 NOTE — Progress Notes (Signed)
*  Preliminary Results* Left upper extremity venous duplex completed. Left upper extremity is negative for deep and superficial vein thrombosis.  10/09/2016 12:18 PM  Maudry Mayhew, BS, RVT, RDCS, RDMS

## 2016-10-10 LAB — BASIC METABOLIC PANEL
Anion gap: 6 (ref 5–15)
BUN: 47 mg/dL — AB (ref 6–20)
CALCIUM: 9.7 mg/dL (ref 8.9–10.3)
CO2: 25 mmol/L (ref 22–32)
CREATININE: 1.59 mg/dL — AB (ref 0.61–1.24)
Chloride: 106 mmol/L (ref 101–111)
GFR calc Af Amer: 48 mL/min — ABNORMAL LOW (ref >60)
GFR, EST NON AFRICAN AMERICAN: 41 mL/min — AB (ref >60)
GLUCOSE: 122 mg/dL — AB (ref 65–99)
Potassium: 5.3 mmol/L — ABNORMAL HIGH (ref 3.5–5.1)
SODIUM: 137 mmol/L (ref 135–145)

## 2016-10-10 LAB — PROCALCITONIN: PROCALCITONIN: 0.2 ng/mL

## 2016-10-10 MED ORDER — ONDANSETRON HCL 4 MG/2ML IJ SOLN: 4.0000 mg | Freq: Four times a day (QID) | INTRAMUSCULAR | Status: DC

## 2016-10-10 MED ORDER — FUROSEMIDE 40 MG PO TABS: 40.0000 mg | ORAL_TABLET | Freq: Two times a day (BID) | ORAL | Status: DC

## 2016-10-10 MED ORDER — FUROSEMIDE 40 MG PO TABS
40.0000 mg | ORAL_TABLET | Freq: Two times a day (BID) | ORAL | Status: DC
  Administered 2016-10-10 (×2): 40 mg via ORAL
  Filled 2016-10-10 (×2): qty 1

## 2016-10-10 MED ORDER — METHOCARBAMOL 500 MG PO TABS
750.0000 mg | ORAL_TABLET | Freq: Three times a day (TID) | ORAL | Status: DC
  Administered 2016-10-10 – 2016-10-18 (×24): 750 mg via ORAL
  Filled 2016-10-10 (×25): qty 2

## 2016-10-10 MED ORDER — HYDROCODONE-ACETAMINOPHEN 5-325 MG PO TABS
1.0000 | ORAL_TABLET | ORAL | Status: DC | PRN
  Administered 2016-10-10: 1 via ORAL
  Filled 2016-10-10: qty 1

## 2016-10-10 MED ORDER — HYDROCODONE-ACETAMINOPHEN 7.5-325 MG PO TABS
1.0000 | ORAL_TABLET | ORAL | Status: DC | PRN
  Administered 2016-10-10 – 2016-10-18 (×15): 1 via ORAL
  Filled 2016-10-10 (×15): qty 1

## 2016-10-10 MED ORDER — ONDANSETRON HCL 4 MG/2ML IJ SOLN
4.0000 mg | Freq: Four times a day (QID) | INTRAMUSCULAR | Status: DC | PRN
  Administered 2016-10-10 – 2016-10-17 (×3): 4 mg via INTRAVENOUS
  Filled 2016-10-10 (×3): qty 2

## 2016-10-10 NOTE — Progress Notes (Signed)
Patient vomited after eating lunch. Also noted some scant bleeding from his penis.MD made aware.

## 2016-10-10 NOTE — Evaluation (Signed)
Physical Therapy Evaluation Patient Details Name: Juan [name] MRN: [id number] DOB: [birth year] Today's Date: 10/10/2016   History of Present Illness  Pt is a 76 yo male admitted through ED on 10/08/16 with acute L arm pain which has persisted since being hospitalized which was found to be related to his myeloproliferative disorder. PMH significant for CHF EF 55%, CKD III, UTI.   Clinical Impression  Pt presents with the above diagnosis and below deficits for therapy evaluation. Prior to admission, pt lived with his son in a single level home and received occasional assistance for bathing and dressing. Pt is able to perform bed mobs with Mod A to bring trunk upright at EOB and Min A for standing and transferring. Pt continues to benefit from acute follow-up in order to address the below deficits prior to DC home.     Follow Up Recommendations Home health PT;Supervision for mobility/OOB    Equipment Recommendations  None recommended by PT    Recommendations for Other Services       Precautions / Restrictions Precautions Precautions: Fall Restrictions Weight Bearing Restrictions: No      Mobility  Bed Mobility Overal bed mobility: Needs Assistance Bed Mobility: Supine to Sit     Supine to sit: Mod assist     General bed mobility comments: Mod A to sit up at EOB  Transfers Overall transfer level: Needs assistance Equipment used: Rolling walker (2 wheeled) Transfers: Sit to/from [facility] Sit to Stand: Min assist Stand pivot transfers: Min assist       General transfer comment: Min A for safety from EOB to recliner  Ambulation/Gait                Stairs            Wheelchair Mobility    Modified Rankin (Stroke Patients Only)       Balance Overall balance assessment: Needs assistance Sitting-balance support: No upper extremity supported;Feet supported Sitting balance-Leahy Scale: Fair     Standing balance support: Bilateral upper  extremity supported;During functional activity Standing balance-Leahy Scale: Poor Standing balance comment: reliant on RW for stability in standing                             Pertinent Vitals/Pain Pain Assessment: Faces Faces Pain Scale: Hurts little more Pain Location: left shoulder Pain Descriptors / Indicators: Aching;Grimacing;Guarding Pain Intervention(s): Monitored during session;Premedicated before session    Home Living Family/patient expects to be discharged to:: Private residence Living Arrangements: Children Available Help at Discharge: Family;Available 24 hours/day Type of Home: Apartment Home Access: Level entry     Home Layout: One level Home Equipment: Walker - 2 wheels;Cane - single point;Shower seat;Bedside commode;Wheelchair - manual Additional Comments: pt lives with his son    Prior Function Level of Independence: Needs assistance   Gait / Transfers Assistance Needed: pt reported that he has not ambulated in over three weeks  ADL's / Homemaking Assistance Needed: Pt's son performs housekeeping and cooking. Pt states he is able to bathe, dress and toilet himself at baseline.         Hand Dominance   Dominant Hand: Right    Extremity/Trunk Assessment   Upper Extremity Assessment Upper Extremity Assessment: Generalized weakness    Lower Extremity Assessment Lower Extremity Assessment: Generalized weakness    Cervical / Trunk Assessment Cervical / Trunk Assessment: Kyphotic  Communication   Communication: No difficulties  Cognition Arousal/Alertness: Awake/alert Behavior During  Therapy: WFL for tasks assessed/performed Overall Cognitive Status: Within Functional Limits for tasks assessed                                        General Comments      Exercises     Assessment/Plan    PT Assessment Patient needs continued PT services  PT Problem List Decreased strength;Decreased activity tolerance;Decreased  balance;Decreased mobility;Decreased knowledge of use of DME;Pain       PT Treatment Interventions DME instruction;Gait training;Stair training;Functional mobility training;Therapeutic activities;Therapeutic exercise;Balance training;Patient/family education    PT Goals (Current goals can be found in the Care Plan section)  Acute Rehab PT Goals Patient Stated Goal: to feel better PT Goal Formulation: With patient Time For Goal Achievement: 10/24/16 Potential to Achieve Goals: Fair    Frequency Min 3X/week   Barriers to discharge        Co-evaluation               AM-PAC PT "6 Clicks" Daily Activity  Outcome Measure Difficulty turning over in bed (including adjusting bedclothes, sheets and blankets)?: Total Difficulty moving from lying on back to sitting on the side of the bed? : Total Difficulty sitting down on and standing up from a chair with arms (e.g., wheelchair, bedside commode, etc,.)?: A Little Help needed moving to and from a bed to chair (including a wheelchair)?: A Little Help needed walking in hospital room?: A Lot Help needed climbing 3-5 steps with a railing? : A Lot 6 Click Score: 12    End of Session Equipment Utilized During Treatment: Gait belt Activity Tolerance: Patient tolerated treatment well Patient left: in chair;with call [place]/phone within reach;with chair alarm set Nurse Communication: Mobility status PT Visit Diagnosis: Unsteadiness on feet (R26.81);Muscle weakness (generalized) (M62.81);Pain Pain - Right/Left: Left Pain - part of body: Shoulder    Time: 3403-7096 PT Time Calculation (min) (ACUTE ONLY): 21 min   Charges:   PT Evaluation $PT Eval Moderate Complexity: 1 Procedure     PT G Codes:        Scheryl Marten PT, DPT  [phone]   Shanon Rosser 10/10/2016, 4:29 PM

## 2016-10-10 NOTE — Consult Note (Signed)
ORTHOPAEDIC CONSULTATION  REQUESTING PHYSICIAN: Rosita Fire, MD  PCP:  Colon Branch, MD  Chief Complaint: Left arm weakness and pain.  HPI: Juan [name] is a right-hand dominant 76 y.o. male who complains of generalized weakness over the last month. He does state that his left upper extremity has been more profound in that. He has had pain that emanates from his neck and radiates down to the thumb of the left hand. Describes the pain as throbbing and aching pain. He has associated weakness with elevating the arm in the plane of scapula. He states that to this point he has tried Tylenol and heat as well as topical anti-inflammatories which do provide some pain relief. He currently is admitted to the hospital for evaluation of this weakness which also involves the right arm but the left is much worse than the right. To this point he has had a x-rays of the shoulder which are benign as well as cervical spine x-rays which show some degenerative disc disease.  He also had a cervical spine MRI which did again demonstrated degenerative disc disease of the cervical spine but without any focal neurologic compression. It also noted some findings consistent with his a myelogram and aplasia in the bone marrow elements.   His medical problems include chronic kidney disease, congestive heart failure, type 2 diabetes, as well as agnogenic myeloid metaplasia.  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  Past Medical History:  Diagnosis Date  . Allergic rhinitis   . Anemia   . Ascending aortic aneurysm ([place]) 03/2014   4.3cm on CT scan  . CAD (coronary artery disease)    dx elsewheer in past, no documentation. Non-ischemic myoview 2007  . Chronic diastolic CHF (congestive heart failure), NYHA class 2 (HCC)    Normal EF w/ grade 1 dd by echo 12/2015  . Edema    R>L leg, u/s 5-12 neg for DVT  . Hemorrhoid   . History of thrombocytosis   .  Hypertension   . Migraine    "once/wk at least" (07/11/2013)  . Myeloproliferative disease ([place])   . Shortness of breath   . Sinus congestion   . Sleep apnea, obstructive    at some point used CPAP, was d/c  years ago  . Type II diabetes mellitus ([place])    Past Surgical History:  Procedure Laterality Date  . TOE SURGERY Right    "tried to straighten out big toe" (07/11/2013)   Social History   Social History  . Marital status: Widowed    Spouse name: N/A  . Number of children: 2  . Years of education: N/A   Occupational History  . retired    .  Retired   Social History Main Topics  . Smoking status: Former Smoker    Packs/day: 0.25    Years: 12.00    Types: Cigarettes    Quit date: 05/18/1966  . Smokeless tobacco: Never Used     Comment: quit smoking 45 years ago  . Alcohol use No  . Drug use: No  . Sexual activity: Yes   Other Topics Concern  . None   Social History Narrative   Moved from [place] 2006   Widow 2007   Son lives with him    Family History  Problem Relation Age of Onset  . Schizophrenia Son   . Colon cancer Neg Hx   . Prostate cancer Neg Hx   .  Heart attack Neg Hx   . Diabetes Neg Hx    No Known Allergies Prior to Admission medications   Medication Sig Start Date End Date Taking? Authorizing Provider  acetaminophen (TYLENOL) 500 MG tablet Take 500-1,000 mg by mouth every 6 (six) hours as needed.   Yes [provider]  aspirin 325 MG tablet Take 325 mg by mouth daily.   Yes [provider]  B Complex Vitamins (VITAMIN B COMPLEX PO) Take 100 mg by mouth daily.   Yes [provider]  bisacodyl (DULCOLAX) 10 MG suppository Place 1 suppository (10 mg total) rectally daily as needed for moderate constipation. 04/18/16  Yes Hongalgi, Lenis Dickinson, MD  carvedilol (COREG) 3.125 MG tablet Take 1 tablet (3.125 mg total) by mouth 2 (two) times daily with a meal. 10/02/16  Yes Tillery, Satira Mccallum, PA-C  dextromethorphan (DELSYM) 30 MG/5ML  liquid Take 5 mLs (30 mg total) by mouth at bedtime as needed for cough. 05/12/16  Yes Elgergawy, Silver Huguenin, MD  diclofenac sodium (VOLTAREN) 1 % GEL Apply 4 g topically 2 (two) times daily as needed (pain). Apply to bilateral  knees    Yes [provider]  feeding supplement, GLUCERNA SHAKE, (GLUCERNA SHAKE) LIQD Take 118.5 mLs by mouth daily. Reported on 08/21/2015   Yes [provider]  FERROUS GLUCONATE PO Take 240 mg by mouth daily at 10 pm. 1 tablet   Yes [provider]  fluticasone (FLONASE) 50 MCG/ACT nasal spray Place 2 sprays into both nostrils daily. Reported on 05/02/2015   Yes [provider]  furosemide (LASIX) 40 MG tablet Take 1-2 tablets (40-80 mg total) by mouth as directed. 80 MG (2 TABS) IN THE AM AND 40 MG (1 TAB) IN THE PM 09/24/16  Yes Jettie Booze E, NP  gabapentin (NEURONTIN) 100 MG capsule Take 100 mg by mouth 3 (three) times daily as needed (pain). Reported on 08/22/2015   Yes [provider]  guaiFENesin (MUCINEX) 600 MG 12 hr tablet Take 2 tablets (1,200 mg total) by mouth 2 (two) times daily. Patient taking differently: Take 1,200 mg by mouth 2 (two) times daily as needed for cough or to loosen phlegm.  04/04/16  Yes Verlee Monte, MD  hydrALAZINE (APRESOLINE) 50 MG tablet Take 1 tablet (50 mg total) by mouth 3 (three) times daily. 10/02/16  Yes Shirley Friar, PA-C  hydrocerin (EUCERIN) CREA Apply Eucerin cream to BLE Q day after bathing and roughly towel drying to remove loose skin Patient taking differently: Apply 1 application topically daily as needed (dryness).  01/27/14  Yes Ghimire, Henreitta Leber, MD  HYDROcodone-acetaminophen (NORCO/VICODIN) 5-325 MG tablet Take 1 tablet by mouth every 8 (eight) hours. routinely Patient taking differently: Take 1 tablet by mouth every 6 (six) hours as needed for moderate pain. routinely  05/12/16  Yes Elgergawy, Silver Huguenin, MD  isosorbide dinitrate (ISORDIL) 30 MG tablet Take 1 tablet (30 mg  total) by mouth 2 (two) times daily. 10/02/16  Yes Shirley Friar, PA-C  metolazone (ZAROXOLYN) 2.5 MG tablet Take 1 tablet (2.5 mg total) by mouth as directed. Patient taking differently: Take 2.5 mg by mouth See admin instructions. When instructed by MD to take 09/24/16 09/24/17 Yes Arbutus Leas, NP  potassium chloride SA (K-DUR,KLOR-CON) 20 MEQ tablet Take 1 tablet (20 mEq total) by mouth as directed. WITH METOLAZONE DOSE ONLY 09/24/16  Yes Arbutus Leas, NP  simethicone (MYLICON) 80 MG chewable tablet Chew 80 mg by mouth every 6 (six)  hours as needed for flatulence.   Yes [provider]   Mr Cervical Spine Wo Contrast  Result Date: 10/09/2016 CLINICAL DATA:  Myeloproliferative disorder with left arm pain. EXAM: MRI CERVICAL SPINE WITHOUT CONTRAST TECHNIQUE: Multiplanar, multisequence MR imaging of the cervical spine was performed. No intravenous contrast was administered. COMPARISON:  1. Bone scan 04/17/2016 2. Chest CT 04/01/2016 FINDINGS: Alignment: Normal Vertebrae: There is low T1 weighted signal throughout the bone marrow with heterogeneous areas of high T2 weighted signal. No discrete focal marrow lesion. Cord: Normal Posterior Fossa, vertebral arteries, paraspinal tissues: Visualized posterior fossa is normal. Vertebral artery flow voids are preserved. Normal visualized paraspinal soft tissues. Disc levels: C1-C2: Normal. C2-C3: Disc desiccation without significant bulge.  No stenosis. C3-C4: Disc space narrowing with mild uncovertebral hypertrophy. No stenosis. C4-C5: Mild bilateral uncovertebral and facet hypertrophy without associated stenosis. C5-C6: Disc desiccation with small bulge narrowing the ventral thecal sac. Mild bilateral uncovertebral hypertrophy. No stenosis. C6-C7: Disc desiccation with narrowing of the disc space and type 2 Modic degenerative endplate changes. C7-T1: Normal disc space and facets. No spinal canal or neuroforaminal stenosis. Within the medial aspect  of the left T2 transverse process, there is a hyperintense T2 weighted signal lesion measuring 8 mm, seen on coronal image 5. IMPRESSION: 1. 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 No specific finding to explain the left arm pain. 2. Diffuse heterogeneous bone marrow signal which is compatible with the patient's known myeloproliferative disorder. 3. Focal T2 hyperintense lesion in the left transverse process of T2, seen only on the coronal sequence and thus incompletely evaluated, but unchanged compared to the chest CT of 04/01/2016. Electronically Signed   By: Ulyses Jarred M.D.   On: 10/09/2016 14:00   Dg Shoulder Left  Result Date: 10/09/2016 CLINICAL DATA:  Left neck pain.  Arm pain.  Left hand swelling . EXAM: LEFT SHOULDER - 2+ VIEW COMPARISON:  Chest x-ray 10/08/2016 FINDINGS: No acute bony or joint abnormality identified. No evidence of fracture or dislocation. IMPRESSION: No acute or focal abnormality. Electronically Signed   By: Marcello Moores  Register   On: 10/09/2016 13:42    Positive ROS: All other systems have been reviewed and were otherwise negative with the exception of those mentioned in the HPI and as above.  Physical Exam: General: Alert, no acute distress Cardiovascular: No pedal edema Respiratory: No cyanosis, no use of accessory musculature GI: No organomegaly, abdomen is soft and non-tender Skin: No lesions in the area of chief complaint Neurologic: Sensation intact distally Psychiatric: Patient is competent for consent with normal mood and affect Lymphatic: No axillary or cervical lymphadenopathy  MUSCULOSKELETAL:  Left shoulder examination.  No overlying skin changes no open wounds. He has tenderness along the lateral aspect of the cervical spine paraspinal musculature down the upper trapezius into the lateral deltoid region. Negative Spurling on the left and right. Nontender at the before meals joint  or proximal biceps. Active forward elevation of the arm demonstrates positive painful arc sign 140. Passively I can get him up to 160. External rotation- 45, internal rotation- hip.  He has 4-5 strength in his [place] and with external rotation. Negative lag signs. Negative bear hug.  No focal neurologic deficits noted on satisfactory comparison the right or left upper extremity. Negative Hoffmann reflexes bilateral upper extremities  Assessment: 1. Left arm pain and weakness 2. Degenerative disc disease of cervical spine  Plan: -I have reviewed the pertinent images of the  left shoulder and cervical spine. There are no obvious antibodies noted on the shoulder films and a MRI of the left shoulder will be helpful to assess her rotator cuff pathology that may explain his weakness. Certainly with the degenerative disc disease but no neurologic compression he would not expect weakness or numbness or radicular pain. We will see what the MRI shows. -In the interim as needed anti-inflammatories if able given his kidney disease and/or acetaminophen with ice and/or heat would be beneficial to the left shoulder. I will follow up on the MRI.    Nicholes Stairs, MD Cell [phone]    10/10/2016 2:18 PM

## 2016-10-10 NOTE — Progress Notes (Addendum)
PROGRESS NOTE    Juan [name]  WUJ:[id number] DOB: 02-04-1941 DOA: 10/08/2016 PCP: Colon Branch, MD   Brief Narrative: 76 y.o. male with a past medical history significant for CHF EF 55%, CKD III baseline Cr 1.4, OSA not on CPAP, and myeloproliferative D/o who presents with left arm pain, found incidentally to have CHF flare, fever.  In the ER patient was found to have temperature of 100.4, oxygen saturation of 89% on room air, UA with UTI.  Assessment & Plan:  #Acute on chronic diastolic congestive heart failure: Patient with leg swelling, elevated BNP, orthopnea. Chest x-ray with acute pulmonary edema. -Shortness of breath is better. Clinically improving Serum creatinine level elevated therefore switched to oral Lasix. Continue to monitor electrolytes. - On aspirin, Coreg, hydralazine, Isordil -Had an echo on 12/24/2015 showed normal left ventricular systolic function with a grade 1 diastolic dysfunction.  #Fever likely due to UTI: Patient reported dysuria which is chronic. Urine culture likely contaminated. Continue ceftriaxone. Pro-calcitonin not elevated  #Acute kidney injury on Chronic kidney disease stage III: Serum creatinine level elevated today likely hemodynamically mediated in the setting of diuretics. Hold tonight's dose of Lasix. Repeat lab in the morning. Avoid nephrotoxins. Monitor BMP -DC potassium chloride supplement has patient has elevated serum potassium level.  #Left upper extremity pain and numbness: Patient has left upper extremity pain and difficulty to elevated. X-ray of the shoulder with no acute finding. Doppler ultrasound of left upper extremity with no thrombosis or DVT. MRI of cervical spine with chronic degenerative changes with no spinal cord stenosis. I increased pain medications and added Robaxin. Continue supportive care. Patient's symptoms will continue to be due to radiculopathy versus rotor cuff tear. Unknown this time. I consulted orthopedics and discussed  with Dr. Stann Mainland. He recommended to order MRI of left shoulder without contrast. Test ordered. Continue PT OT evaluation.  #History of myeloproliferative disorder: seen by oncologist in the past. Recommended outpatient follow-up.  Principal Problem:   Acute on chronic diastolic CHF (congestive heart failure) (HCC) Active Problems:   CKD (chronic kidney disease) stage 3, GFR 30-59 ml/min   Agnogenic myeloid metaplasia (HCC)   Type II diabetes mellitus (HCC)   CHF (congestive heart failure) (HCC)   Fever   Left arm pain   Acute kidney injury superimposed on CKD (HCC)  DVT prophylaxis: Lovenox subcutaneous Code Status: Full code Family Communication: No family at bedside Disposition Plan: Likely discharge home in 1-2 days. Order PT OT    Consultants:   Orthopedics  Procedures: None Antimicrobials: Ceftriaxone since 5/24  Subjective: Seen and examined at bedside. Continue to complain of left upper extremity pain and difficulty moving. Denied headache, dizziness, nausea, vomiting, chest pain or shortness of breath. Objective: Vitals:   10/09/16 1652 10/09/16 2021 10/10/16 0557 10/10/16 0949  BP: 132/66 (!) 163/87 (!) 148/79 137/62  Pulse: 82 90 86 92  Resp: 16 16 16 17   Temp: 99.2 F (37.3 C) 98.8 F (37.1 C) 97.7 F (36.5 C) 98 F (36.7 C)  TempSrc: Oral Oral Oral Oral  SpO2: 97% 95% 90% 97%  Weight:  89.1 kg (196 lb 8 oz)    Height:        Intake/Output Summary (Last 24 hours) at 10/10/16 1231 Last data filed at 10/10/16 1100  Gross per 24 hour  Intake              720 ml  Output             1100 ml  Net             -380 ml   Filed Weights   10/08/16 0419 10/08/16 0832 10/09/16 2021  Weight: 92.5 kg (204 lb) 89.3 kg (196 lb 12.8 oz) 89.1 kg (196 lb 8 oz)    Examination:  General exam: Not in distress Respiratory system: Clear bilateral, respiratory effort normal, no wheezing  Cardiovascular system: Regular rate rhythm, S1-S2 normal   Gastrointestinal  system: Abdomen is nondistended, soft and nontender. Normal bowel sounds heard. Central nervous system: Alert awake and following commands  Extremities:  No lower extremity edema. Limited range of motion of left upper extremity mostly in left shoulder joint because of pain. Muscular strength okay.  Skin: No rashes, lesions or ulcers Psychiatry: Judgement and insight appear normal. Mood & affect appropriate.     Data Reviewed: I have personally reviewed following labs and imaging studies  CBC:  Recent Labs Lab 10/08/16 0435 10/09/16 0437  WBC 10.1 11.4*  NEUTROABS 6.2  --   HGB 10.6* 10.8*  HCT 33.7* 35.2*  MCV 81.2 81.9  PLT 618* 779*   Basic Metabolic Panel:  Recent Labs Lab 10/08/16 0435 10/09/16 0437 10/10/16 0453  NA 138 138 137  K 4.4 4.6 5.3*  CL 106 107 106  CO2 22 26 25   GLUCOSE 109* 132* 122*  BUN 53* 50* 47*  CREATININE 1.41* 1.17 1.59*  CALCIUM 9.8 9.7 9.7   GFR: Estimated Creatinine Clearance: 48.7 mL/min (A) (by C-G formula based on SCr of 1.59 mg/dL (H)). Liver Function Tests:  Recent Labs Lab 10/08/16 0435  AST 32  ALT 15*  ALKPHOS 104  BILITOT 0.6  PROT 6.0*  ALBUMIN 3.2*   No results for input(s): LIPASE, AMYLASE in the last 168 hours. No results for input(s): AMMONIA in the last 168 hours. Coagulation Profile: No results for input(s): INR, PROTIME in the last 168 hours. Cardiac Enzymes: No results for input(s): CKTOTAL, CKMB, CKMBINDEX, TROPONINI in the last 168 hours. BNP (last 3 results) No results for input(s): PROBNP in the last 8760 hours. HbA1C: No results for input(s): HGBA1C in the last 72 hours. CBG: No results for input(s): GLUCAP in the last 168 hours. Lipid Profile: No results for input(s): CHOL, HDL, LDLCALC, TRIG, CHOLHDL, LDLDIRECT in the last 72 hours. Thyroid Function Tests: No results for input(s): TSH, T4TOTAL, FREET4, T3FREE, THYROIDAB in the last 72 hours. Anemia Panel: No results for input(s): VITAMINB12,  FOLATE, FERRITIN, TIBC, IRON, RETICCTPCT in the last 72 hours. Sepsis Labs:  Recent Labs Lab 10/08/16 0435 10/08/16 0438 10/10/16 0453  PROCALCITON 0.14  --  0.20  LATICACIDVEN  --  0.69  --     Recent Results (from the past 240 hour(s))  Urine culture     Status: Abnormal   Collection Time: 10/08/16  4:19 AM  Result Value Ref Range Status   Specimen Description URINE, RANDOM  Final   Special Requests NONE  Final   Culture MULTIPLE SPECIES PRESENT, SUGGEST RECOLLECTION (A)  Final   Report Status 10/09/2016 FINAL  Final  Blood Culture (routine x 2)     Status: None (Preliminary result)   Collection Time: 10/08/16  4:35 AM  Result Value Ref Range Status   Specimen Description BLOOD RIGHT ARM  Final   Special Requests   Final    BOTTLES DRAWN AEROBIC AND ANAEROBIC Blood Culture adequate volume   Culture NO GROWTH 2 DAYS  Final   Report Status PENDING  Incomplete  Blood Culture (routine  x 2)     Status: None (Preliminary result)   Collection Time: 10/08/16  4:36 AM  Result Value Ref Range Status   Specimen Description BLOOD RIGHT HAND  Final   Special Requests   Final    IN PEDIATRIC BOTTLE Blood Culture results may not be optimal due to an excessive volume of blood received in culture bottles   Culture NO GROWTH 2 DAYS  Final   Report Status PENDING  Incomplete         Radiology Studies: Mr Cervical Spine Wo Contrast  Result Date: 10/09/2016 CLINICAL DATA:  Myeloproliferative disorder with left arm pain. EXAM: MRI CERVICAL SPINE WITHOUT CONTRAST TECHNIQUE: Multiplanar, multisequence MR imaging of the cervical spine was performed. No intravenous contrast was administered. COMPARISON:  1. Bone scan 04/17/2016 2. Chest CT 04/01/2016 FINDINGS: Alignment: Normal Vertebrae: There is low T1 weighted signal throughout the bone marrow with heterogeneous areas of high T2 weighted signal. No discrete focal marrow lesion. Cord: Normal Posterior Fossa, vertebral arteries, paraspinal  tissues: Visualized posterior fossa is normal. Vertebral artery flow voids are preserved. Normal visualized paraspinal soft tissues. Disc levels: C1-C2: Normal. C2-C3: Disc desiccation without significant bulge.  No stenosis. C3-C4: Disc space narrowing with mild uncovertebral hypertrophy. No stenosis. C4-C5: Mild bilateral uncovertebral and facet hypertrophy without associated stenosis. C5-C6: Disc desiccation with small bulge narrowing the ventral thecal sac. Mild bilateral uncovertebral hypertrophy. No stenosis. C6-C7: Disc desiccation with narrowing of the disc space and type 2 Modic degenerative endplate changes. C7-T1: Normal disc space and facets. No spinal canal or neuroforaminal stenosis. Within the medial aspect of the left T2 transverse process, there is a hyperintense T2 weighted signal lesion measuring 8 mm, seen on coronal image 5. IMPRESSION: 1. 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 No specific finding to explain the left arm pain. 2. Diffuse heterogeneous bone marrow signal which is compatible with the patient's known myeloproliferative disorder. 3. Focal T2 hyperintense lesion in the left transverse process of T2, seen only on the coronal sequence and thus incompletely evaluated, but unchanged compared to the chest CT of 04/01/2016. Electronically Signed   By: Ulyses Jarred M.D.   On: 10/09/2016 14:00   Dg Shoulder Left  Result Date: 10/09/2016 CLINICAL DATA:  Left neck pain.  Arm pain.  Left hand swelling . EXAM: LEFT SHOULDER - 2+ VIEW COMPARISON:  Chest x-ray 10/08/2016 FINDINGS: No acute bony or joint abnormality identified. No evidence of fracture or dislocation. IMPRESSION: No acute or focal abnormality. Electronically Signed   By: [place]   On: 10/09/2016 13:42        Scheduled Meds: . aspirin  325 mg Oral Daily  . carvedilol  3.125 mg Oral BID WC  . diclofenac sodium  2 g Topical BID  .  enoxaparin (LOVENOX) injection  40 mg Subcutaneous Q24H  . furosemide  40 mg Oral BID  . hydrALAZINE  50 mg Oral TID  . isosorbide dinitrate  30 mg Oral BID  . mouth rinse  15 mL Mouth Rinse BID  . methocarbamol  750 mg Oral TID   Continuous Infusions: . cefTRIAXone (ROCEPHIN)  IV Stopped (10/10/16 0707)     LOS: 1 day    Dron Tanna Furry, MD [facility] Pager [phone]  If 7PM-7AM, please contact night-coverage [email or web address] Password [facility] 10/10/2016, 12:31 PM

## 2016-10-11 ENCOUNTER — Other Ambulatory Visit (HOSPITAL_COMMUNITY): Payer: Self-pay

## 2016-10-11 LAB — BASIC METABOLIC PANEL
Anion gap: 9 (ref 5–15)
BUN: 50 mg/dL — AB (ref 6–20)
CO2: 24 mmol/L (ref 22–32)
CREATININE: 1.76 mg/dL — AB (ref 0.61–1.24)
Calcium: 9.5 mg/dL (ref 8.9–10.3)
Chloride: 103 mmol/L (ref 101–111)
GFR calc Af Amer: 42 mL/min — ABNORMAL LOW (ref >60)
GFR, EST NON AFRICAN AMERICAN: 36 mL/min — AB (ref >60)
Glucose, Bld: 110 mg/dL — ABNORMAL HIGH (ref 65–99)
POTASSIUM: 5.6 mmol/L — AB (ref 3.5–5.1)
SODIUM: 136 mmol/L (ref 135–145)

## 2016-10-11 MED ORDER — SODIUM CHLORIDE 0.9 % IV SOLN
INTRAVENOUS | Status: DC
  Administered 2016-10-11: 11:00:00 via INTRAVENOUS

## 2016-10-11 MED ORDER — CALCIUM CARBONATE ANTACID 500 MG PO CHEW
1.0000 | CHEWABLE_TABLET | Freq: Once | ORAL | Status: AC
  Administered 2016-10-11: 200 mg via ORAL
  Filled 2016-10-11: qty 1

## 2016-10-11 NOTE — Progress Notes (Signed)
PROGRESS NOTE    Juan [name]  SWH:[id number] DOB: 1940/05/31 DOA: 10/08/2016 PCP: Colon Branch, MD   Brief Narrative: 76 y.o. male with a past medical history significant for CHF EF 55%, CKD III baseline Cr 1.4, OSA not on CPAP, and myeloproliferative D/o who presents with left arm pain, found incidentally to have CHF flare, fever.  In the ER patient was found to have temperature of 100.4, oxygen saturation of 89% on room air, UA with UTI.  Assessment & Plan:  #Acute on chronic diastolic congestive heart failure:  -Clinically improved. No shortness of breath or chest pain. Holding Lasix because of worsening creatinine. - On aspirin, Coreg, hydralazine, Isordil -Had an echo on 12/24/2015 showed normal left ventricular systolic function with a grade 1 diastolic dysfunction.  #Fever likely due to UTI: Patient reported dysuria which is chronic. Urine culture likely contaminated. Continue ceftriaxone, last dose tomorrow.   #Acute kidney injury on Chronic kidney disease stage III: Worsening in serum creatinine level likely hemodynamically mediated in the setting of Lasix. Holding diuretics and is starting normal saline for short period of time. Repeat lab in the morning. Checking bladder scan. Patient reported a spot of blood yesterday from penile urethra. Discontinue Lovenox.  # hyperkalemia:Patient has mild hyperkalemia likely in the setting of potassium supplement and worsening renal failure. Off oral potassium chloride supplement. On gentle IV hydration. Repeat lab in the morning.  #Left upper extremity pain and numbness: Patient has left upper extremity pain and difficulty to elevate. X-ray of the shoulder with no acute finding. Doppler ultrasound of left upper extremity with no thrombosis or DVT. MRI of cervical spine with chronic degenerative changes with no spinal cord stenosis. I increased pain medications and added Robaxin.  -Also consult appreciated. Pending MRI of left shoulder. Patient has  increased mobility of his left upper extremity today. Continue to monitor for now. Continue supportive care. He needs physical therapy and OT on discharge.  #History of myeloproliferative disorder: seen by oncologist in the past. Recommended outpatient follow-up.  Principal Problem:   Acute on chronic diastolic CHF (congestive heart failure) (HCC) Active Problems:   CKD (chronic kidney disease) stage 3, GFR 30-59 ml/min   Agnogenic myeloid metaplasia (HCC)   Type II diabetes mellitus (HCC)   CHF (congestive heart failure) (HCC)   Fever   Left arm pain   Acute kidney injury superimposed on CKD ([place])  DVT prophylaxis: SCD.  Code Status: Full code Family Communication: No family at bedside Disposition Plan: Likely discharge home in 1-2 days. Order PT OT    Consultants:   Orthopedics  Procedures: None Antimicrobials: Ceftriaxone since 5/24  Subjective: Seen and examined at bedside. Continue to complain of left shoulder joint pain. Denied headache, dizziness, nausea, vomiting, chest pain shortness of breath. Has generalized weakness. Objective: Vitals:   10/10/16 1828 10/10/16 2132 10/11/16 0541 10/11/16 0955  BP:  (!) 108/54 116/60 128/64  Pulse:  90 96 93  Resp:  15 15 16   Temp:  97.7 F (36.5 C) 99.3 F (37.4 C) 98.4 F (36.9 C)  TempSrc:  Axillary Oral Oral  SpO2: 95% 94% 92% 94%  Weight:      Height:        Intake/Output Summary (Last 24 hours) at 10/11/16 1203 Last data filed at 10/11/16 0900  Gross per 24 hour  Intake              832 ml  Output  150 ml  Net              682 ml   Filed Weights   10/08/16 0419 10/08/16 0832 10/09/16 2021  Weight: 92.5 kg (204 lb) 89.3 kg (196 lb 12.8 oz) 89.1 kg (196 lb 8 oz)    Examination:  General exam: Sitting on chair comfortably, not in distress Respiratory system: Clear bilaterally, respiratory effort normal, no wheezing  Cardiovascular system: Regular rate rhythm, S1-S2 normal   Gastrointestinal  system: Abdomen is nondistended, soft and nontender. Normal bowel sounds heard. Central nervous system: Alert awake and following commands  Extremities: No lower extremity edema. Left shoulder joint and left upper extremity looks normal on exam. No swelling or erythema. Movement of the left arm is better today.  Skin: No rashes, lesions or ulcers Psychiatry: Judgement and insight appear normal. Mood & affect appropriate.     Data Reviewed: I have personally reviewed following labs and imaging studies  CBC:  Recent Labs Lab 10/08/16 0435 10/09/16 0437  WBC 10.1 11.4*  NEUTROABS 6.2  --   HGB 10.6* 10.8*  HCT 33.7* 35.2*  MCV 81.2 81.9  PLT 618* 841*   Basic Metabolic Panel:  Recent Labs Lab 10/08/16 0435 10/09/16 0437 10/10/16 0453 10/11/16 0607  NA 138 138 137 136  K 4.4 4.6 5.3* 5.6*  CL 106 107 106 103  CO2 22 26 25 24   GLUCOSE 109* 132* 122* 110*  BUN 53* 50* 47* 50*  CREATININE 1.41* 1.17 1.59* 1.76*  CALCIUM 9.8 9.7 9.7 9.5   GFR: Estimated Creatinine Clearance: 44 mL/min (A) (by C-G formula based on SCr of 1.76 mg/dL (H)). Liver Function Tests:  Recent Labs Lab 10/08/16 0435  AST 32  ALT 15*  ALKPHOS 104  BILITOT 0.6  PROT 6.0*  ALBUMIN 3.2*   No results for input(s): LIPASE, AMYLASE in the last 168 hours. No results for input(s): AMMONIA in the last 168 hours. Coagulation Profile: No results for input(s): INR, PROTIME in the last 168 hours. Cardiac Enzymes: No results for input(s): CKTOTAL, CKMB, CKMBINDEX, TROPONINI in the last 168 hours. BNP (last 3 results) No results for input(s): PROBNP in the last 8760 hours. HbA1C: No results for input(s): HGBA1C in the last 72 hours. CBG: No results for input(s): GLUCAP in the last 168 hours. Lipid Profile: No results for input(s): CHOL, HDL, LDLCALC, TRIG, CHOLHDL, LDLDIRECT in the last 72 hours. Thyroid Function Tests: No results for input(s): TSH, T4TOTAL, FREET4, T3FREE, THYROIDAB in the last 72  hours. Anemia Panel: No results for input(s): VITAMINB12, FOLATE, FERRITIN, TIBC, IRON, RETICCTPCT in the last 72 hours. Sepsis Labs:  Recent Labs Lab 10/08/16 0435 10/08/16 0438 10/10/16 0453  PROCALCITON 0.14  --  0.20  LATICACIDVEN  --  0.69  --     Recent Results (from the past 240 hour(s))  Urine culture     Status: Abnormal   Collection Time: 10/08/16  4:19 AM  Result Value Ref Range Status   Specimen Description URINE, RANDOM  Final   Special Requests NONE  Final   Culture MULTIPLE SPECIES PRESENT, SUGGEST RECOLLECTION (A)  Final   Report Status 10/09/2016 FINAL  Final  Blood Culture (routine x 2)     Status: None (Preliminary result)   Collection Time: 10/08/16  4:35 AM  Result Value Ref Range Status   Specimen Description BLOOD RIGHT ARM  Final   Special Requests   Final    BOTTLES DRAWN AEROBIC AND ANAEROBIC Blood Culture adequate  volume   Culture NO GROWTH 2 DAYS  Final   Report Status PENDING  Incomplete  Blood Culture (routine x 2)     Status: None (Preliminary result)   Collection Time: 10/08/16  4:36 AM  Result Value Ref Range Status   Specimen Description BLOOD RIGHT HAND  Final   Special Requests   Final    IN PEDIATRIC BOTTLE Blood Culture results may not be optimal due to an excessive volume of blood received in culture bottles   Culture NO GROWTH 2 DAYS  Final   Report Status PENDING  Incomplete         Radiology Studies: Mr Cervical Spine Wo Contrast  Result Date: 10/09/2016 CLINICAL DATA:  Myeloproliferative disorder with left arm pain. EXAM: MRI CERVICAL SPINE WITHOUT CONTRAST TECHNIQUE: Multiplanar, multisequence MR imaging of the cervical spine was performed. No intravenous contrast was administered. COMPARISON:  1. Bone scan 04/17/2016 2. Chest CT 04/01/2016 FINDINGS: Alignment: Normal Vertebrae: There is low T1 weighted signal throughout the bone marrow with heterogeneous areas of high T2 weighted signal. No discrete focal marrow lesion. Cord:  Normal Posterior Fossa, vertebral arteries, paraspinal tissues: Visualized posterior fossa is normal. Vertebral artery flow voids are preserved. Normal visualized paraspinal soft tissues. Disc levels: C1-C2: Normal. C2-C3: Disc desiccation without significant bulge.  No stenosis. C3-C4: Disc space narrowing with mild uncovertebral hypertrophy. No stenosis. C4-C5: Mild bilateral uncovertebral and facet hypertrophy without associated stenosis. C5-C6: Disc desiccation with small bulge narrowing the ventral thecal sac. Mild bilateral uncovertebral hypertrophy. No stenosis. C6-C7: Disc desiccation with narrowing of the disc space and type 2 Modic degenerative endplate changes. C7-T1: Normal disc space and facets. No spinal canal or neuroforaminal stenosis. Within the medial aspect of the left T2 transverse process, there is a hyperintense T2 weighted signal lesion measuring 8 mm, seen on coronal image 5. IMPRESSION: 1. 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 No specific finding to explain the left arm pain. 2. Diffuse heterogeneous bone marrow signal which is compatible with the patient's known myeloproliferative disorder. 3. Focal T2 hyperintense lesion in the left transverse process of T2, seen only on the coronal sequence and thus incompletely evaluated, but unchanged compared to the chest CT of 04/01/2016. Electronically Signed   By: Ulyses Jarred M.D.   On: 10/09/2016 14:00   Dg Shoulder Left  Result Date: 10/09/2016 CLINICAL DATA:  Left neck pain.  Arm pain.  Left hand swelling . EXAM: LEFT SHOULDER - 2+ VIEW COMPARISON:  Chest x-ray 10/08/2016 FINDINGS: No acute bony or joint abnormality identified. No evidence of fracture or dislocation. IMPRESSION: No acute or focal abnormality. Electronically Signed   By: [place]   On: 10/09/2016 13:42        Scheduled Meds: . aspirin  325 mg Oral Daily  . carvedilol  3.125 mg  Oral BID WC  . diclofenac sodium  2 g Topical BID  . hydrALAZINE  50 mg Oral TID  . isosorbide dinitrate  30 mg Oral BID  . mouth rinse  15 mL Mouth Rinse BID  . methocarbamol  750 mg Oral TID   Continuous Infusions: . sodium chloride 75 mL/hr at 10/11/16 1030  . cefTRIAXone (ROCEPHIN)  IV Stopped (10/11/16 0411)     LOS: 2 days    Dron Tanna Furry, MD [facility] Pager [phone]  If 7PM-7AM, please contact night-coverage [email or web address] Password [facility] 10/11/2016, 12:03 PM

## 2016-10-12 ENCOUNTER — Inpatient Hospital Stay (HOSPITAL_COMMUNITY): Payer: Medicare Other

## 2016-10-12 DIAGNOSIS — E875 Hyperkalemia: Secondary | ICD-10-CM

## 2016-10-12 LAB — BASIC METABOLIC PANEL
ANION GAP: 11 (ref 5–15)
Anion gap: 8 (ref 5–15)
BUN: 57 mg/dL — AB (ref 6–20)
BUN: 61 mg/dL — ABNORMAL HIGH (ref 6–20)
CHLORIDE: 104 mmol/L (ref 101–111)
CHLORIDE: 105 mmol/L (ref 101–111)
CO2: 23 mmol/L (ref 22–32)
CO2: 20 mmol/L — ABNORMAL LOW (ref 22–32)
CREATININE: 2.07 mg/dL — AB (ref 0.61–1.24)
Calcium: 9.6 mg/dL (ref 8.9–10.3)
Calcium: 9.4 mg/dL (ref 8.9–10.3)
Creatinine, Ser: 2.15 mg/dL — ABNORMAL HIGH (ref 0.61–1.24)
GFR calc Af Amer: 35 mL/min — ABNORMAL LOW (ref >60)
GFR calc Af Amer: 33 mL/min — ABNORMAL LOW (ref >60)
GFR calc non Af Amer: 30 mL/min — ABNORMAL LOW (ref >60)
GFR, EST NON AFRICAN AMERICAN: 29 mL/min — AB (ref >60)
GLUCOSE: 113 mg/dL — AB (ref 65–99)
Glucose, Bld: 125 mg/dL — ABNORMAL HIGH (ref 65–99)
POTASSIUM: 6 mmol/L — AB (ref 3.5–5.1)
POTASSIUM: 5.3 mmol/L — AB (ref 3.5–5.1)
SODIUM: 136 mmol/L (ref 135–145)
Sodium: 135 mmol/L (ref 135–145)

## 2016-10-12 LAB — BRAIN NATRIURETIC PEPTIDE: B Natriuretic Peptide: 989.3 pg/mL — ABNORMAL HIGH (ref 0.0–100.0)

## 2016-10-12 MED ORDER — DOCUSATE SODIUM 100 MG PO CAPS
100.0000 mg | ORAL_CAPSULE | Freq: Two times a day (BID) | ORAL | Status: DC | PRN
  Filled 2016-10-12: qty 1

## 2016-10-12 MED ORDER — SODIUM POLYSTYRENE SULFONATE 15 GM/60ML PO SUSP
30.0000 g | Freq: Once | ORAL | Status: AC
  Administered 2016-10-12: 30 g via ORAL
  Filled 2016-10-12: qty 120

## 2016-10-12 MED ORDER — POLYETHYLENE GLYCOL 3350 17 G PO PACK
17.0000 g | PACK | Freq: Every day | ORAL | Status: DC
  Administered 2016-10-12 – 2016-10-16 (×5): 17 g via ORAL
  Filled 2016-10-12 (×5): qty 1

## 2016-10-12 MED ORDER — IPRATROPIUM-ALBUTEROL 0.5-2.5 (3) MG/3ML IN SOLN
3.0000 mL | Freq: Four times a day (QID) | RESPIRATORY_TRACT | Status: DC
  Administered 2016-10-12: 3 mL via RESPIRATORY_TRACT
  Filled 2016-10-12: qty 3

## 2016-10-12 MED ORDER — GABAPENTIN 100 MG PO CAPS
200.0000 mg | ORAL_CAPSULE | Freq: Three times a day (TID) | ORAL | Status: DC | PRN
  Administered 2016-10-13 – 2016-10-17 (×5): 200 mg via ORAL
  Filled 2016-10-12 (×5): qty 2

## 2016-10-12 MED ORDER — HYDROMORPHONE HCL 1 MG/ML IJ SOLN
0.5000 mg | INTRAMUSCULAR | Status: DC | PRN
  Administered 2016-10-12 – 2016-10-15 (×7): 0.5 mg via INTRAVENOUS
  Filled 2016-10-12 (×7): qty 0.5

## 2016-10-12 MED ORDER — LACTULOSE 10 GM/15ML PO SOLN
30.0000 g | Freq: Once | ORAL | Status: AC
  Administered 2016-10-12: 30 g via ORAL
  Filled 2016-10-12: qty 45

## 2016-10-12 MED ORDER — FUROSEMIDE 10 MG/ML IJ SOLN
40.0000 mg | Freq: Two times a day (BID) | INTRAMUSCULAR | Status: DC
  Administered 2016-10-12 – 2016-10-15 (×6): 40 mg via INTRAVENOUS
  Filled 2016-10-12 (×6): qty 4

## 2016-10-12 MED ORDER — IPRATROPIUM-ALBUTEROL 0.5-2.5 (3) MG/3ML IN SOLN
3.0000 mL | Freq: Three times a day (TID) | RESPIRATORY_TRACT | Status: DC
Start: 2016-10-13 — End: 2016-10-14
  Administered 2016-10-13 – 2016-10-14 (×3): 3 mL via RESPIRATORY_TRACT
  Filled 2016-10-12 (×4): qty 3

## 2016-10-12 MED ORDER — AZITHROMYCIN 500 MG PO TABS
500.0000 mg | ORAL_TABLET | Freq: Every day | ORAL | Status: DC
  Administered 2016-10-12 – 2016-10-16 (×5): 500 mg via ORAL
  Filled 2016-10-12 (×5): qty 1

## 2016-10-12 NOTE — Progress Notes (Addendum)
PROGRESS NOTE    Juan [name]  TLX:[id number] DOB: [birth year] DOA: 10/08/2016 PCP: Colon Branch, MD   Brief Narrative: 76 y.o. male with a past medical history significant for CHF EF 55%, CKD III baseline Cr 1.4, OSA not on CPAP, and myeloproliferative D/o who presents with left arm pain, found incidentally to have CHF flare, fever.  In the ER patient was found to have temperature of 100.4, oxygen saturation of 89% on room air, UA with UTI.  Assessment & Plan:  #Acute on chronic diastolic congestive heart failure:  -Patient has a cough and elevated BNP.  -I will start IV Lasix 40 mg twice a day. Strict in's and out, check echocardiogram. - On aspirin, Coreg,  Isordil. Hold hydralazine because of borderline low BP -Had an echo on 12/24/2015 showed normal left ventricular systolic function with a grade 1 diastolic dysfunction.  #Possible multilobar bronchopneumonia: Patient had a fever this morning. Patient was already on ceftriaxone for possible UTI. I will add DuoNeb and azithromycin. Continue supportive care. Urine culture likely contaminated.  #Acute kidney injury on Chronic kidney disease stage III with hyperkalemia:  -Bladder scan consistent with more than 750 mL of urine. Place Foley catheter. Strict in's and out. Getting echocardiogram. Treat hyperkalemia with kayexalate. Avoid nephrotoxins. -Monitor BMP.  #Left upper extremity pain and numbness: Patient has left upper extremity pain and difficulty to elevate. X-ray of the shoulder with no acute finding. Doppler ultrasound of left upper extremity with no thrombosis or DVT. MRI of cervical spine with chronic degenerative changes with no spinal cord stenosis. Continue oral pain medications including Robaxin, oxycodone prn. Increase the dose of gabapentin. Added IV Dilaudid for breakthrough pain control. Patient refused MRI of left shoulder yesterday. Now agreed for the test. -Continue pain management. Evaluated by orthopedics who recommended  MRI. Consult appreciated. Continue PT OT evaluation and supportive care  #Constipation: Bowel regimen including lactulose, MiraLAX and Colace.  #History of myeloproliferative disorder: seen by oncologist in the past. Recommended outpatient follow-up.  Principal Problem:   Acute on chronic diastolic CHF (congestive heart failure) (HCC) Active Problems:   CKD (chronic kidney disease) stage 3, GFR 30-59 ml/min   Agnogenic myeloid metaplasia (HCC)   Type II diabetes mellitus (HCC)   CHF (congestive heart failure) (HCC)   Fever   Left arm pain   Acute kidney injury superimposed on CKD ([place])  DVT prophylaxis: SCD. Holding Lovenox because of episodes of blood from penis 2 days ago improved now. Code Status: Full code Family Communication: No family at bedside Disposition Plan: Likely discharge home in 1-2 days. Order PT OT    Consultants:   Orthopedics  Procedures: None Antimicrobials: Ceftriaxone since 5/24  Subjective: Seen and examined at bedside. Having cough associated with mild shortness of breath. The shoulder pain is unchanged. Denied headache, nausea, vomiting, chest pain. He denies urinary symptoms. Objective: Vitals:   10/11/16 0955 10/11/16 1719 10/11/16 2140 10/12/16 0604  BP: 128/64 (!) 92/51 (!) 92/49 140/69  Pulse: 93 91 93 98  Resp: 16 16 16 17   Temp: 98.4 F (36.9 C) 98.8 F (37.1 C) (!) 100.7 F (38.2 C) 97.8 F (36.6 C)  TempSrc: Oral Oral Oral Oral  SpO2: 94% 94% 93% 97%  Weight:   90.2 kg (198 lb 12.8 oz)   Height:        Intake/Output Summary (Last 24 hours) at 10/12/16 1348 Last data filed at 10/12/16 1300  Gross per 24 hour  Intake  120 ml  Output             1125 ml  Net            -1005 ml   Filed Weights   10/08/16 0832 10/09/16 2021 10/11/16 2140  Weight: 89.3 kg (196 lb 12.8 oz) 89.1 kg (196 lb 8 oz) 90.2 kg (198 lb 12.8 oz)    Examination:  General exam:Sitting on bed comfortable, not in distress Respiratory system:  Bibasal crackles, respiratory effort normal. No wheezing Cardiovascular system: Regular rate rhythm, S1-S2 normal   Gastrointestinal system: Abdomen is nondistended, soft and nontender. Normal bowel sounds heard. Central nervous system: Alert awake and following commands  Extremities: No lower extremity edema. Left shoulder joint and left upper extremity looks normal on exam. No swelling or erythema. Exam unchanged today.  Skin: No rashes, lesions or ulcers Psychiatry: Judgement and insight appear normal. Mood & affect appropriate.     Data Reviewed: I have personally reviewed following labs and imaging studies  CBC:  Recent Labs Lab 10/08/16 0435 10/09/16 0437  WBC 10.1 11.4*  NEUTROABS 6.2  --   HGB 10.6* 10.8*  HCT 33.7* 35.2*  MCV 81.2 81.9  PLT 618* 035*   Basic Metabolic Panel:  Recent Labs Lab 10/08/16 0435 10/09/16 0437 10/10/16 0453 10/11/16 0607 10/12/16 0415  NA 138 138 137 136 135  K 4.4 4.6 5.3* 5.6* 6.0*  CL 106 107 106 103 104  CO2 22 26 25 24 23   GLUCOSE 109* 132* 122* 110* 113*  BUN 53* 50* 47* 50* 57*  CREATININE 1.41* 1.17 1.59* 1.76* 2.07*  CALCIUM 9.8 9.7 9.7 9.5 9.6   GFR: Estimated Creatinine Clearance: 37.4 mL/min (A) (by C-G formula based on SCr of 2.07 mg/dL (H)). Liver Function Tests:  Recent Labs Lab 10/08/16 0435  AST 32  ALT 15*  ALKPHOS 104  BILITOT 0.6  PROT 6.0*  ALBUMIN 3.2*   No results for input(s): LIPASE, AMYLASE in the last 168 hours. No results for input(s): AMMONIA in the last 168 hours. Coagulation Profile: No results for input(s): INR, PROTIME in the last 168 hours. Cardiac Enzymes: No results for input(s): CKTOTAL, CKMB, CKMBINDEX, TROPONINI in the last 168 hours. BNP (last 3 results) No results for input(s): PROBNP in the last 8760 hours. HbA1C: No results for input(s): HGBA1C in the last 72 hours. CBG: No results for input(s): GLUCAP in the last 168 hours. Lipid Profile: No results for input(s): CHOL,  HDL, LDLCALC, TRIG, CHOLHDL, LDLDIRECT in the last 72 hours. Thyroid Function Tests: No results for input(s): TSH, T4TOTAL, FREET4, T3FREE, THYROIDAB in the last 72 hours. Anemia Panel: No results for input(s): VITAMINB12, FOLATE, FERRITIN, TIBC, IRON, RETICCTPCT in the last 72 hours. Sepsis Labs:  Recent Labs Lab 10/08/16 0435 10/08/16 0438 10/10/16 0453  PROCALCITON 0.14  --  0.20  LATICACIDVEN  --  0.69  --     Recent Results (from the past 240 hour(s))  Urine culture     Status: Abnormal   Collection Time: 10/08/16  4:19 AM  Result Value Ref Range Status   Specimen Description URINE, RANDOM  Final   Special Requests NONE  Final   Culture MULTIPLE SPECIES PRESENT, SUGGEST RECOLLECTION (A)  Final   Report Status 10/09/2016 FINAL  Final  Blood Culture (routine x 2)     Status: None (Preliminary result)   Collection Time: 10/08/16  4:35 AM  Result Value Ref Range Status   Specimen Description BLOOD RIGHT ARM  Final   Special Requests   Final    BOTTLES DRAWN AEROBIC AND ANAEROBIC Blood Culture adequate volume   Culture NO GROWTH 4 DAYS  Final   Report Status PENDING  Incomplete  Blood Culture (routine x 2)     Status: None (Preliminary result)   Collection Time: 10/08/16  4:36 AM  Result Value Ref Range Status   Specimen Description BLOOD RIGHT HAND  Final   Special Requests   Final    IN PEDIATRIC BOTTLE Blood Culture results may not be optimal due to an excessive volume of blood received in culture bottles   Culture NO GROWTH 4 DAYS  Final   Report Status PENDING  Incomplete         Radiology Studies: Dg Chest Port 1 View  Result Date: 10/12/2016 CLINICAL DATA:  76 year old male with history of fever. EXAM: PORTABLE CHEST 1 VIEW COMPARISON:  Chest x-ray a 10/08/2016. FINDINGS: Lung volumes are low. Opacity multifocal interstitial and airspace disease, most confluent in the left lower lobe. No definite pleural effusions. Heart size is borderline enlarged, likely  accentuated by low lung volumes. Pulmonary vasculature does not appear engorged. Upper mediastinal contours are distorted by patient positioning. IMPRESSION: 1. Patchy asymmetrically distributed multifocal interstitial and airspace disease most confluent in the left lower lobe, concerning for multilobar bronchopneumonia. Electronically Signed   By: Vinnie Langton M.D.   On: 10/12/2016 10:45        Scheduled Meds: . aspirin  325 mg Oral Daily  . azithromycin  500 mg Oral Daily  . carvedilol  3.125 mg Oral BID WC  . furosemide  40 mg Intravenous Q12H  . hydrALAZINE  50 mg Oral TID  . isosorbide dinitrate  30 mg Oral BID  . mouth rinse  15 mL Mouth Rinse BID  . methocarbamol  750 mg Oral TID  . polyethylene glycol  17 g Oral Daily   Continuous Infusions: . cefTRIAXone (ROCEPHIN)  IV Stopped (10/12/16 0509)     LOS: 3 days    Dron Tanna Furry, MD [facility] Pager [phone]  If 7PM-7AM, please contact night-coverage [email or web address] Password [email or web address] 10/12/2016, 1:48 PM

## 2016-10-12 NOTE — Progress Notes (Signed)
Patient pulled off his oxygen tubing. O2 saturation 77% on RA. Replaced O2 @ 2L via [place]. O2 saturation back up to 98%. Educated patient. Verbalizes understanding. Will continue to monitor.

## 2016-10-13 ENCOUNTER — Inpatient Hospital Stay (HOSPITAL_COMMUNITY): Payer: Medicare Other

## 2016-10-13 LAB — BASIC METABOLIC PANEL
ANION GAP: 10 (ref 5–15)
BUN: 59 mg/dL — AB (ref 6–20)
CHLORIDE: 105 mmol/L (ref 101–111)
CO2: 21 mmol/L — ABNORMAL LOW (ref 22–32)
Calcium: 9.1 mg/dL (ref 8.9–10.3)
Creatinine, Ser: 1.93 mg/dL — ABNORMAL HIGH (ref 0.61–1.24)
GFR calc Af Amer: 38 mL/min — ABNORMAL LOW (ref >60)
GFR calc non Af Amer: 33 mL/min — ABNORMAL LOW (ref >60)
GLUCOSE: 102 mg/dL — AB (ref 65–99)
Potassium: 4.9 mmol/L (ref 3.5–5.1)
SODIUM: 136 mmol/L (ref 135–145)

## 2016-10-13 LAB — CULTURE, BLOOD (ROUTINE X 2)
Culture: NO GROWTH
Culture: NO GROWTH
SPECIAL REQUESTS: ADEQUATE

## 2016-10-13 LAB — CBC
HCT: 31.6 % — ABNORMAL LOW (ref 39.0–52.0)
HEMOGLOBIN: 9.6 g/dL — AB (ref 13.0–17.0)
MCH: 25 pg — ABNORMAL LOW (ref 26.0–34.0)
MCHC: 30.4 g/dL (ref 30.0–36.0)
MCV: 82.3 fL (ref 78.0–100.0)
Platelets: 638 10*3/uL — ABNORMAL HIGH (ref 150–400)
RBC: 3.84 MIL/uL — ABNORMAL LOW (ref 4.22–5.81)
RDW: 18.3 % — AB (ref 11.5–15.5)
WBC: 10.1 10*3/uL (ref 4.0–10.5)

## 2016-10-13 LAB — MRSA PCR SCREENING: MRSA by PCR: POSITIVE — AB

## 2016-10-13 MED ORDER — ENOXAPARIN SODIUM 40 MG/0.4ML ~~LOC~~ SOLN
40.0000 mg | SUBCUTANEOUS | Status: DC
  Administered 2016-10-13 – 2016-10-17 (×5): 40 mg via SUBCUTANEOUS
  Filled 2016-10-13 (×5): qty 0.4

## 2016-10-13 MED ORDER — LIDOCAINE 5 % EX OINT
TOPICAL_OINTMENT | Freq: Three times a day (TID) | CUTANEOUS | Status: DC
  Administered 2016-10-13 – 2016-10-14 (×6): via TOPICAL
  Administered 2016-10-15: 1 via TOPICAL
  Administered 2016-10-15 – 2016-10-16 (×3): via TOPICAL
  Administered 2016-10-17: 1 via TOPICAL
  Administered 2016-10-17 – 2016-10-18 (×3): via TOPICAL
  Filled 2016-10-13 (×2): qty 35.44

## 2016-10-13 MED ORDER — GADOBENATE DIMEGLUMINE 529 MG/ML IV SOLN
10.0000 mL | Freq: Once | INTRAVENOUS | Status: AC
  Administered 2016-10-13: 8 mL via INTRAVENOUS

## 2016-10-13 MED ORDER — MUPIROCIN 2 % EX OINT
1.0000 "application " | TOPICAL_OINTMENT | Freq: Two times a day (BID) | CUTANEOUS | Status: DC
  Administered 2016-10-14 – 2016-10-18 (×9): 1 via NASAL
  Filled 2016-10-13 (×4): qty 22

## 2016-10-13 MED ORDER — CHLORHEXIDINE GLUCONATE CLOTH 2 % EX PADS
6.0000 | MEDICATED_PAD | Freq: Every day | CUTANEOUS | Status: DC
  Administered 2016-10-14 – 2016-10-18 (×4): 6 via TOPICAL

## 2016-10-13 NOTE — Consult Note (Signed)
   [facility] Inpatient Consult   10/13/2016  Juan [name] 04-27-1941 [id number]  Patient was evaluated for [facility] [place] [facility] services for HF exacerbation and community case management.  Patient was admitted with UTI, HX of HF with exacerbation.  Met with the patient at the bedside. Explained [place] [facility]'s role with Medicare.  Patient endorses Dr. Evelena Leyden  For his primary care provider but mostly with the HF clinic.  He states he and his son [name] lives together and neither on of them can drive.  He states that [name] is his most help and his daughter, [name] and her son works all of the time and cannot assist. Patient states he gets to his appointments from a call for a cab voucher that the "ladies who come out in the police-like uniforms [[facility] HF clinic].  Inquired about [facility] form based Palliative program.  He states, "I don't know what happened but I have the uniform people now."  This writer called [facility] and spoke with Tammy who states the patient would never answer or return calls and has been closed for a while.   Spoke with patient about community needs he states, "the only problem we [Jason and himself] was for food. Explained to the patient how [place] [facility] can assist with resources.  He states, "I don't won't that meals on wheels or those type of programs cause I heard they don't cook the food right." Patient decline [facility] services stating he has "the uniform people and a monthly visit would not be enough." Patient did accept a brochure with contact information.  Updated inpatient RNCM of findings and patient declining [facility] services.   For questions, please contact:  [name], RN BSN [facility] Liaison  [phone] business mobile phone Toll free office [phone]

## 2016-10-13 NOTE — Progress Notes (Signed)
PT Cancellation Note  Patient Details Name: Juan [name] MRN: [id number] DOB: 12-04-1940   Cancelled Treatment:    Reason Eval/Treat Not Completed: Other (comment)   Attempted to work with [name], refuses and is fixated on going to radiology and talking with his MD;  Notified RN;   Roney Marion, [place] Pager [phone] Office [place] 10/13/2016, 2:51 PM

## 2016-10-13 NOTE — Progress Notes (Signed)
PROGRESS NOTE    Juan [name]  EUM:[id number] DOB: 1940/12/11 DOA: 10/08/2016 PCP: Colon Branch, MD   Brief Narrative: 76 y.o. male with a past medical history significant for CHF EF 55%, CKD III baseline Cr 1.4, OSA not on CPAP, and myeloproliferative D/o who presents with left arm pain, found incidentally to have CHF flare, fever.  In the ER patient was found to have temperature of 100.4, oxygen saturation of 89% on room air, UA with UTI.  Assessment & Plan:  #Acute on chronic diastolic congestive heart failure:  -Patient has a cough and elevated BNP.  -Clinically improving therefore continue IV Lasix, pending echo, strict ins and outs. Renal function improving. - On aspirin, Coreg,  Isordil. Hold hydralazine because of borderline low BP -Had an echo on 12/24/2015 showed normal left ventricular systolic function with a grade 1 diastolic dysfunction.  #Possible multilobar bronchopneumonia: Continue azithromycin, ceftriaxone, bronchodilators. Continue supportive care.  -Using 2 L of oxygen.  #Acute kidney injury on Chronic kidney disease stage III with hyperkalemia:  -Bladder scan consistent with more than 750 mL of urine on 5/28. Placed Foley catheter. Strict in's and out.  -Creatinine level trending down. Continue IV Lasix. Monitor BMP. Avoid nephrotoxins. Hypokalemia improved.  #Left upper extremity pain and numbness: Patient has left upper extremity pain and difficulty to elevate. X-ray of the shoulder with no acute finding. Doppler ultrasound of left upper extremity with no thrombosis or DVT. MRI of cervical spine with chronic degenerative changes with no spinal cord stenosis. Continue oral pain medications including Robaxin, oxycodone prn. Increase the dose of gabapentin.  -Also on IV Dilaudid for breakthrough pain control.  -MRI of the left shoulder is still pending. Patient has been intermittently refusing the test. -Continue pain management. Evaluated by orthopedics who recommended  MRI. Consult appreciated. Continue PT OT evaluation and supportive care. Ordered lidocaine gel to apply topically. Discussed with the patient and nurse.  #Constipation: Bowel regimen including lactulose, MiraLAX and Colace. Monitor  #History of myeloproliferative disorder: seen by oncologist in the past. Recommended outpatient follow-up.  Principal Problem:   Acute on chronic diastolic CHF (congestive heart failure) (HCC) Active Problems:   CKD (chronic kidney disease) stage 3, GFR 30-59 ml/min   Agnogenic myeloid metaplasia (HCC)   Type II diabetes mellitus (HCC)   CHF (congestive heart failure) (HCC)   Fever   Left arm pain   Acute kidney injury superimposed on CKD (HCC)   Hyperkalemia  DVT prophylaxis: Order low-dose Lovenox. No sign of bleeding. Code Status: Full code Family Communication: No family at bedside Disposition Plan: Likely discharge home in 1-2 days. Order PT OT    Consultants:   Orthopedics  Procedures: None Antimicrobials: Ceftriaxone since 5/24 Azithromycin Subjective: Seen and examined at bedside. Reported no new improvement in shoulder and neck area. Shortness of breath better. Cough is better. Denied chest pain, abdominal pain. Has Foley catheter.  Objective: Vitals:   10/12/16 1753 10/12/16 2334 10/13/16 0510 10/13/16 1000  BP:  103/62 134/73 (!) 146/83  Pulse:  89 82 87  Resp:  16 16 18   Temp:  97.6 F (36.4 C) 97.4 F (36.3 C) 97.8 F (36.6 C)  TempSrc:  Oral Oral Oral  SpO2: 94% 92% 94% 98%  Weight:  87.5 kg (193 lb)    Height:        Intake/Output Summary (Last 24 hours) at 10/13/16 1415 Last data filed at 10/13/16 1400  Gross per 24 hour  Intake  1292 ml  Output             1150 ml  Net              142 ml   Filed Weights   10/09/16 2021 10/11/16 2140 10/12/16 2334  Weight: 89.1 kg (196 lb 8 oz) 90.2 kg (198 lb 12.8 oz) 87.5 kg (193 lb)    Examination:  General exam:Anxious male sitting on bed  comfortable Respiratory system: Bibasal crackles improving, no wheezing, respiratory effort normal. Cardiovascular system: Regular rate rhythm, S1 and S2 normal  Gastrointestinal system: Abdomen is nondistended, soft and nontender. Normal bowel sounds heard. Central nervous system: Alert awake and following commands  Extremities: No lower extremity edema. Improving upper extremities movement.  Skin: No rashes, lesions or ulcers Psychiatry: Judgement and insight appear normal. Mood & affect appropriate.     Data Reviewed: I have personally reviewed following labs and imaging studies  CBC:  Recent Labs Lab 10/08/16 0435 10/09/16 0437 10/13/16 0753  WBC 10.1 11.4* 10.1  NEUTROABS 6.2  --   --   HGB 10.6* 10.8* 9.6*  HCT 33.7* 35.2* 31.6*  MCV 81.2 81.9 82.3  PLT 618* 627* 485*   Basic Metabolic Panel:  Recent Labs Lab 10/10/16 0453 10/11/16 0607 10/12/16 0415 10/12/16 1540 10/13/16 0753  NA 137 136 135 136 136  K 5.3* 5.6* 6.0* 5.3* 4.9  CL 106 103 104 105 105  CO2 25 24 23  20* 21*  GLUCOSE 122* 110* 113* 125* 102*  BUN 47* 50* 57* 61* 59*  CREATININE 1.59* 1.76* 2.07* 2.15* 1.93*  CALCIUM 9.7 9.5 9.6 9.4 9.1   GFR: Estimated Creatinine Clearance: 40.1 mL/min (A) (by C-G formula based on SCr of 1.93 mg/dL (H)). Liver Function Tests:  Recent Labs Lab 10/08/16 0435  AST 32  ALT 15*  ALKPHOS 104  BILITOT 0.6  PROT 6.0*  ALBUMIN 3.2*   No results for input(s): LIPASE, AMYLASE in the last 168 hours. No results for input(s): AMMONIA in the last 168 hours. Coagulation Profile: No results for input(s): INR, PROTIME in the last 168 hours. Cardiac Enzymes: No results for input(s): CKTOTAL, CKMB, CKMBINDEX, TROPONINI in the last 168 hours. BNP (last 3 results) No results for input(s): PROBNP in the last 8760 hours. HbA1C: No results for input(s): HGBA1C in the last 72 hours. CBG: No results for input(s): GLUCAP in the last 168 hours. Lipid Profile: No results  for input(s): CHOL, HDL, LDLCALC, TRIG, CHOLHDL, LDLDIRECT in the last 72 hours. Thyroid Function Tests: No results for input(s): TSH, T4TOTAL, FREET4, T3FREE, THYROIDAB in the last 72 hours. Anemia Panel: No results for input(s): VITAMINB12, FOLATE, FERRITIN, TIBC, IRON, RETICCTPCT in the last 72 hours. Sepsis Labs:  Recent Labs Lab 10/08/16 0435 10/08/16 0438 10/10/16 0453  PROCALCITON 0.14  --  0.20  LATICACIDVEN  --  0.69  --     Recent Results (from the past 240 hour(s))  Urine culture     Status: Abnormal   Collection Time: 10/08/16  4:19 AM  Result Value Ref Range Status   Specimen Description URINE, RANDOM  Final   Special Requests NONE  Final   Culture MULTIPLE SPECIES PRESENT, SUGGEST RECOLLECTION (A)  Final   Report Status 10/09/2016 FINAL  Final  Blood Culture (routine x 2)     Status: None (Preliminary result)   Collection Time: 10/08/16  4:35 AM  Result Value Ref Range Status   Specimen Description BLOOD RIGHT ARM  Final  Special Requests   Final    BOTTLES DRAWN AEROBIC AND ANAEROBIC Blood Culture adequate volume   Culture NO GROWTH 4 DAYS  Final   Report Status PENDING  Incomplete  Blood Culture (routine x 2)     Status: None (Preliminary result)   Collection Time: 10/08/16  4:36 AM  Result Value Ref Range Status   Specimen Description BLOOD RIGHT HAND  Final   Special Requests   Final    IN PEDIATRIC BOTTLE Blood Culture results may not be optimal due to an excessive volume of blood received in culture bottles   Culture NO GROWTH 4 DAYS  Final   Report Status PENDING  Incomplete         Radiology Studies: Dg Chest Port 1 View  Result Date: 10/12/2016 CLINICAL DATA:  76 year old male with history of fever. EXAM: PORTABLE CHEST 1 VIEW COMPARISON:  Chest x-ray a 10/08/2016. FINDINGS: Lung volumes are low. Opacity multifocal interstitial and airspace disease, most confluent in the left lower lobe. No definite pleural effusions. Heart size is borderline  enlarged, likely accentuated by low lung volumes. Pulmonary vasculature does not appear engorged. Upper mediastinal contours are distorted by patient positioning. IMPRESSION: 1. Patchy asymmetrically distributed multifocal interstitial and airspace disease most confluent in the left lower lobe, concerning for multilobar bronchopneumonia. Electronically Signed   By: Vinnie Langton M.D.   On: 10/12/2016 10:45        Scheduled Meds: . aspirin  325 mg Oral Daily  . azithromycin  500 mg Oral Daily  . carvedilol  3.125 mg Oral BID WC  . furosemide  40 mg Intravenous Q12H  . ipratropium-albuterol  3 mL Nebulization TID  . isosorbide dinitrate  30 mg Oral BID  . lidocaine   Topical TID  . mouth rinse  15 mL Mouth Rinse BID  . methocarbamol  750 mg Oral TID  . polyethylene glycol  17 g Oral Daily   Continuous Infusions: . cefTRIAXone (ROCEPHIN)  IV Stopped (10/13/16 0655)     LOS: 4 days    Delvonte Berenson Tanna Furry, MD [facility] Pager [phone]  If 7PM-7AM, please contact night-coverage [email or web address] Password [email or web address] 10/13/2016, 2:15 PM

## 2016-10-13 NOTE — Progress Notes (Signed)
CRITICAL VALUE ALERT  Critical Value:  Positive MRSA PCR  Date & Time Notied:  10/13/16  Provider Notified: Dr. Carolin Sicks  Orders Received/Actions taken:  Protocol initiated

## 2016-10-14 ENCOUNTER — Inpatient Hospital Stay (HOSPITAL_COMMUNITY): Payer: Medicare Other

## 2016-10-14 DIAGNOSIS — S43422A Sprain of left rotator cuff capsule, initial encounter: Secondary | ICD-10-CM

## 2016-10-14 DIAGNOSIS — I509 Heart failure, unspecified: Secondary | ICD-10-CM

## 2016-10-14 LAB — CBC
HEMATOCRIT: 31.7 % — AB (ref 39.0–52.0)
Hemoglobin: 9.7 g/dL — ABNORMAL LOW (ref 13.0–17.0)
MCH: 24.9 pg — ABNORMAL LOW (ref 26.0–34.0)
MCHC: 30.6 g/dL (ref 30.0–36.0)
MCV: 81.3 fL (ref 78.0–100.0)
PLATELETS: 770 10*3/uL — AB (ref 150–400)
RBC: 3.9 MIL/uL — AB (ref 4.22–5.81)
RDW: 18.1 % — ABNORMAL HIGH (ref 11.5–15.5)
WBC: 12.7 10*3/uL — AB (ref 4.0–10.5)

## 2016-10-14 LAB — ECHOCARDIOGRAM COMPLETE
Ao-asc: 36 cm
CHL CUP MV DEC (S): 136
CHL CUP RV SYS PRESS: 85 mmHg
CHL CUP TV REG PEAK VELOCITY: 418 cm/s
E decel time: 136 msec
EERAT: 7.71
FS: 43 % (ref 28–44)
Height: 75 in
IV/PV OW: 1.38
LA ID, A-P, ES: 46 mm
LA diam end sys: 46 mm
LA vol A4C: 49.9 ml
LA vol index: 23 mL/m2
LADIAMINDEX: 2.14 cm/m2
LAVOL: 49.5 mL
LV PW d: 13.1 mm — AB (ref 0.6–1.1)
LV TDI E'LATERAL: 8.49
LV TDI E'MEDIAL: 5.33
LVEEAVG: 7.71
LVEEMED: 7.71
LVELAT: 8.49 cm/s
LVOT VTI: 21.5 cm
LVOT area: 3.46 cm2
LVOT peak grad rest: 5 mmHg
LVOTD: 21 mm
LVOTPV: 116 cm/s
LVOTSV: 74 mL
MV pk A vel: 106 m/s
MVAP: 5.5 cm2
MVPKEVEL: 65.5 m/s
P 1/2 time: 40 ms
RV LATERAL S' VELOCITY: 11.7 cm/s
RV TAPSE: 18.5 mm
TR max vel: 418 cm/s
Weight: 3086.44 oz

## 2016-10-14 LAB — BASIC METABOLIC PANEL
ANION GAP: 8 (ref 5–15)
BUN: 54 mg/dL — ABNORMAL HIGH (ref 6–20)
CALCIUM: 9.1 mg/dL (ref 8.9–10.3)
CO2: 24 mmol/L (ref 22–32)
Chloride: 104 mmol/L (ref 101–111)
Creatinine, Ser: 1.65 mg/dL — ABNORMAL HIGH (ref 0.61–1.24)
GFR, EST AFRICAN AMERICAN: 46 mL/min — AB (ref >60)
GFR, EST NON AFRICAN AMERICAN: 39 mL/min — AB (ref >60)
Glucose, Bld: 111 mg/dL — ABNORMAL HIGH (ref 65–99)
POTASSIUM: 4.7 mmol/L (ref 3.5–5.1)
Sodium: 136 mmol/L (ref 135–145)

## 2016-10-14 MED ORDER — LIDOCAINE 1% INJECTION FOR CIRCUMCISION
1.0000 mL | INJECTION | Freq: Once | INTRAVENOUS | Status: DC
  Filled 2016-10-14: qty 1

## 2016-10-14 MED ORDER — BUPIVACAINE HCL 0.25 % IJ SOLN
5.0000 mL | Freq: Once | INTRAMUSCULAR | Status: DC
  Filled 2016-10-14: qty 5

## 2016-10-14 MED ORDER — HYDRALAZINE HCL 25 MG PO TABS
25.0000 mg | ORAL_TABLET | Freq: Three times a day (TID) | ORAL | Status: DC
  Administered 2016-10-14 – 2016-10-18 (×12): 25 mg via ORAL
  Filled 2016-10-14 (×11): qty 1

## 2016-10-14 MED ORDER — GUAIFENESIN-DM 100-10 MG/5ML PO SYRP
5.0000 mL | ORAL_SOLUTION | ORAL | Status: DC | PRN
  Administered 2016-10-14: 5 mL via ORAL

## 2016-10-14 MED ORDER — IPRATROPIUM-ALBUTEROL 0.5-2.5 (3) MG/3ML IN SOLN: 3.0000 mL | Freq: Four times a day (QID) | RESPIRATORY_TRACT | Status: DC | PRN

## 2016-10-14 MED ORDER — ALUM & MAG HYDROXIDE-SIMETH 200-200-20 MG/5ML PO SUSP
30.0000 mL | Freq: Four times a day (QID) | ORAL | Status: DC | PRN
  Administered 2016-10-14: 30 mL via ORAL

## 2016-10-14 MED ORDER — METHYLPREDNISOLONE ACETATE 40 MG/ML IJ SUSP
2.0000 mg | Freq: Once | INTRAMUSCULAR | Status: DC
  Filled 2016-10-14: qty 1

## 2016-10-14 NOTE — Progress Notes (Signed)
Pleasant 76 year old male.  Myself and Dr. Rolena Infante were contacted while "on call" about his shoulder pain.  His MRI was positive for a supraspinous tear.  Dr. Rolena Infante consulted with Dr. Stann Mainland.  I performed a I/A left shoulder injection.  I cleaned below the [facility] joint posterior.  Injected 1 cc lidcocaine, then injected 3 cc Marcaine and 2 cc Depo.  Placed band aid.  Pt tolerated the procedure well.  Pt is to f/u with Dr. Stann Mainland in clinic after discharge from [place]

## 2016-10-14 NOTE — Progress Notes (Signed)
Ortho Note  I reviewed the MRI scan of the patient's left shoulder.  He has a small full thickness supraspinous rotator cuff tear without retraction or fatty atrophy of the muscle belly itself. This could certainly explain some of his left shoulder pain and dysfunction. Recommendation for this time will be for left shoulder corticosteroid injection performed by one of our physician's assistants in house. And then he should follow up with me in 2-4 weeks for outpatient workup and follow-up.   Nicholes Stairs

## 2016-10-14 NOTE — Progress Notes (Signed)
Physical Therapy Treatment Patient Details Name: Juan [name] MRN: [id number] DOB: 01-Feb-1941 Today's Date: 10/14/2016    History of Present Illness Pt is a 76 yo male admitted through ED on 10/08/16 with acute L arm pain which has persisted since being hospitalized which was found to be related to his myeloproliferative disorder. 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     PT Comments    Continuing work on functional mobility and activity tolerance;  Slow progress with noting variable participation in PT; Not exactly sure of available assist at home as well   Follow Up Recommendations  Home health PT;Supervision for mobility/OOB;Other (comment) (It may be time to consider SNF for Post-Acute Rehab if we can't confirm 24 hour assist at home)     Equipment Recommendations  Rolling walker with 5" wheels    Recommendations for Other Services OT consult     Precautions / Restrictions Precautions Precautions: Fall    Mobility  Bed Mobility Overal bed mobility: Needs Assistance Bed Mobility: Supine to Sit     Supine to sit: Mod assist     General bed mobility comments: light mod handheld assist RUE to pull to sit  Transfers Overall transfer level: Needs assistance Equipment used: Rolling walker (2 wheeled) Transfers: Sit to/from Stand Sit to Stand: Mod assist         General transfer comment: Light mod assist to power up from low bed  Ambulation/Gait Ambulation/Gait assistance: Min guard Ambulation Distance (Feet): 5 Feet Assistive device: Rolling walker (2 wheeled) Gait Pattern/deviations: Step-through pattern;Trunk flexed     General Gait Details: Pt choosing to limit amb distance   Stairs            Wheelchair Mobility    Modified Rankin (Stroke Patients Only)       Balance     Sitting balance-Leahy Scale: Fair       Standing balance-Leahy  Scale: Poor                              Cognition Arousal/Alertness: Awake/alert Behavior During Therapy: WFL for tasks assessed/performed Overall Cognitive Status: Within Functional Limits for tasks assessed                                        Exercises      General Comments        Pertinent Vitals/Pain Pain Assessment: Faces Faces Pain Scale: Hurts little more Pain Location: left shoulder Pain Descriptors / Indicators: Aching;Grimacing;Guarding Pain Intervention(s): Monitored during session;Repositioned;Heat applied;Patient requesting pain meds-RN notified    Home Living                      Prior Function            PT Goals (current goals can now be found in the care plan section) Acute Rehab PT Goals Patient Stated Goal: to feel better PT Goal Formulation: With patient Time For Goal Achievement: 10/24/16 Potential to Achieve Goals: Fair Progress towards PT goals: Progressing toward goals    Frequency    Min 3X/week      PT Plan Current plan remains appropriate    Co-evaluation              AM-PAC PT "  6 Clicks" Daily Activity  Outcome Measure  Difficulty turning over in bed (including adjusting bedclothes, sheets and blankets)?: Total Difficulty moving from lying on back to sitting on the side of the bed? : Total Difficulty sitting down on and standing up from a chair with arms (e.g., wheelchair, bedside commode, etc,.)?: A Lot Help needed moving to and from a bed to chair (including a wheelchair)?: A Little Help needed walking in hospital room?: A Little Help needed climbing 3-5 steps with a railing? : A Lot 6 Click Score: 12    End of Session Equipment Utilized During Treatment: Gait belt;Oxygen (was on supplemental O2 upon entry) Activity Tolerance: Patient tolerated treatment well Patient left: in chair;with call Divirgilio/phone within reach;with chair alarm set Nurse Communication: Mobility status PT  Visit Diagnosis: Unsteadiness on feet (R26.81);Muscle weakness (generalized) (M62.81);Pain Pain - Right/Left: Left Pain - part of body: Shoulder     Time: 0254-2706 PT Time Calculation (min) (ACUTE ONLY): 24 min  Charges:  $Gait Training: 8-22 mins $Therapeutic Activity: 8-22 mins                    G Codes:       Roney Marion, PT  Acute Rehabilitation Services Pager [phone] Office [place] 10/14/2016, 12:45 PM

## 2016-10-14 NOTE — Progress Notes (Signed)
  Echocardiogram 2D Echocardiogram has been performed.  Juan [name] 10/14/2016, 11:18 AM

## 2016-10-14 NOTE — Progress Notes (Signed)
PROGRESS NOTE    Juan [name]  MRN:[id number] DOB: 02/22/1941 DOA: 10/08/2016 PCP: Paz, Jose E, MD   Brief Narrative: 76 y.o. male with a past medical history significant for CHF EF 55%, CKD III baseline Cr 1.4, OSA not on CPAP, and myeloproliferative D/o who presents with left arm pain, found incidentally to have CHF flare, fever.  In the ER patient was found to have temperature of 100.4, oxygen saturation of 89% on room air, UA with UTI.  Assessment & Plan:  #Acute on chronic diastolic congestive heart failure:  -Patient has a cough and elevated BNP on admission.  -Shortness of breath and cough is better today, continue IV Lasix today and plan to switch to oral Lasix tomorrow. Follow-up echocardiogram. Monitor renal function. Avoid nephrotoxins. - On aspirin, Coreg,  Isordil. Resume hydralazine since the blood pressure is improved -Had an echo on 12/24/2015 showed normal left ventricular systolic function with a grade 1 diastolic dysfunction.  #Possible multilobar bronchopneumonia: Continue azithromycin, ceftriaxone, bronchodilators. Continue supportive care.  -Using 2 L of oxygen. Trying to wean down oxygen. Continue supportive care.  #Acute kidney injury on Chronic kidney disease stage III with hyperkalemia:  -Bladder scan consistent with more than 750 mL of urine on 5/28. Placed Foley catheter. Strict in's and out.  -Creatinine level trending down to 1.6. Continue IV Lasix. Monitor BMP. Avoid nephrotoxins. Hyperkalemia improved.  #Left upper extremity pain and numbness: Patient has left upper extremity pain and difficulty to elevate. X-ray of the shoulder with no acute finding. Doppler ultrasound of left upper extremity with no thrombosis or DVT. MRI of cervical spine with chronic degenerative changes with no spinal cord stenosis.  -Orthopedics consulted with recommended MRI of the left shoulder. Finally patient agreed for MRI. That is on his back consistent with rotator cuff tear. I  discussed with Dr. Brooks from orthopedics. For now continue current pain medications. He is on Robaxin, oxycodone and, Neurontin and Dilaudid as needed. Also ordered Xylocaine local. He is still having pain however better than yesterday.   #Constipation: Bowel regimen including lactulose, MiraLAX and Colace. Monitor  #History of myeloproliferative disorder: seen by oncologist in the past. Recommended outpatient follow-up.  Principal Problem:   Acute on chronic diastolic CHF (congestive heart failure) (HCC) Active Problems:   CKD (chronic kidney disease) stage 3, GFR 30-59 ml/min   Agnogenic myeloid metaplasia (HCC)   Type II diabetes mellitus (HCC)   CHF (congestive heart failure) (HCC)   Fever   Left arm pain   Acute kidney injury superimposed on CKD (HCC)   Hyperkalemia  DVT prophylaxis: Order low-dose Lovenox. No sign of bleeding. Code Status: Full code Family Communication: No family at bedside Disposition Plan: Likely discharge home in 1-2 days. Order PT OT    Consultants:   Orthopedics  Procedures: None Antimicrobials: Ceftriaxone since 5/24 Azithromycin Subjective: Seen and examined at bedside. Still complaining of left shoulder and neck pain. He is able to move his left upper extremities. Denied chest pain, shortness of breath, nausea or vomiting. Objective: Vitals:   10/13/16 1000 10/13/16 2245 10/14/16 0630 10/14/16 0922  BP: (!) 146/83 (!) 159/76 138/74 (!) 174/93  Pulse: 87 87 83 83  Resp: 18 18 18 17  Temp: 97.8 F (36.6 C) 97.5 F (36.4 C) 98 F (36.7 C) 98 F (36.7 C)  TempSrc: Oral Oral Oral Oral  SpO2: 98% 97% 97% 95%  Weight:  87.5 kg (192 lb 14.4 oz)    Height:          Intake/Output Summary (Last 24 hours) at 10/14/16 1140 Last data filed at 10/14/16 0900  Gross per 24 hour  Intake              720 ml  Output             3150 ml  Net            -2430 ml   Filed Weights   10/11/16 2140 10/12/16 2334 10/13/16 2245  Weight: 90.2 kg (198 lb  12.8 oz) 87.5 kg (193 lb) 87.5 kg (192 lb 14.4 oz)    Examination:  General exam:Male lying on bed comfortable  Respiratory system: Bibasal crackles improving, no wheezing.  Cardiovascular system: Regular rate rhythm, S1 and S2 normal  Gastrointestinal system: Abdomen is nondistended, soft and nontender. Normal bowel sounds heard. Central nervous system: Alert awake and following commands  Extremities: No lower extremity edema. Improving left upper extremity movement and able to feed himself.  Skin: No rashes, lesions or ulcers Psychiatry: Judgement and insight appear normal. Mood & affect appropriate.     Data Reviewed: I have personally reviewed following labs and imaging studies  CBC:  Recent Labs Lab 10/08/16 0435 10/09/16 0437 10/13/16 0753 10/14/16 0351  WBC 10.1 11.4* 10.1 12.7*  NEUTROABS 6.2  --   --   --   HGB 10.6* 10.8* 9.6* 9.7*  HCT 33.7* 35.2* 31.6* 31.7*  MCV 81.2 81.9 82.3 81.3  PLT 618* 627* 638* 770*   Basic Metabolic Panel:  Recent Labs Lab 10/11/16 0607 10/12/16 0415 10/12/16 1540 10/13/16 0753 10/14/16 0351  NA 136 135 136 136 136  K 5.6* 6.0* 5.3* 4.9 4.7  CL 103 104 105 105 104  CO2 24 23 20* 21* 24  GLUCOSE 110* 113* 125* 102* 111*  BUN 50* 57* 61* 59* 54*  CREATININE 1.76* 2.07* 2.15* 1.93* 1.65*  CALCIUM 9.5 9.6 9.4 9.1 9.1   GFR: Estimated Creatinine Clearance: 46.9 mL/min (A) (by C-G formula based on SCr of 1.65 mg/dL (H)). Liver Function Tests:  Recent Labs Lab 10/08/16 0435  AST 32  ALT 15*  ALKPHOS 104  BILITOT 0.6  PROT 6.0*  ALBUMIN 3.2*   No results for input(s): LIPASE, AMYLASE in the last 168 hours. No results for input(s): AMMONIA in the last 168 hours. Coagulation Profile: No results for input(s): INR, PROTIME in the last 168 hours. Cardiac Enzymes: No results for input(s): CKTOTAL, CKMB, CKMBINDEX, TROPONINI in the last 168 hours. BNP (last 3 results) No results for input(s): PROBNP in the last 8760  hours. HbA1C: No results for input(s): HGBA1C in the last 72 hours. CBG: No results for input(s): GLUCAP in the last 168 hours. Lipid Profile: No results for input(s): CHOL, HDL, LDLCALC, TRIG, CHOLHDL, LDLDIRECT in the last 72 hours. Thyroid Function Tests: No results for input(s): TSH, T4TOTAL, FREET4, T3FREE, THYROIDAB in the last 72 hours. Anemia Panel: No results for input(s): VITAMINB12, FOLATE, FERRITIN, TIBC, IRON, RETICCTPCT in the last 72 hours. Sepsis Labs:  Recent Labs Lab 10/08/16 0435 10/08/16 0438 10/10/16 0453  PROCALCITON 0.14  --  0.20  LATICACIDVEN  --  0.69  --     Recent Results (from the past 240 hour(s))  Urine culture     Status: Abnormal   Collection Time: 10/08/16  4:19 AM  Result Value Ref Range Status   Specimen Description URINE, RANDOM  Final   Special Requests NONE  Final   Culture MULTIPLE SPECIES PRESENT, SUGGEST RECOLLECTION (A)  Final     Report Status 10/09/2016 FINAL  Final  Blood Culture (routine x 2)     Status: None   Collection Time: 10/08/16  4:35 AM  Result Value Ref Range Status   Specimen Description BLOOD RIGHT ARM  Final   Special Requests   Final    BOTTLES DRAWN AEROBIC AND ANAEROBIC Blood Culture adequate volume   Culture NO GROWTH 5 DAYS  Final   Report Status 10/13/2016 FINAL  Final  Blood Culture (routine x 2)     Status: None   Collection Time: 10/08/16  4:36 AM  Result Value Ref Range Status   Specimen Description BLOOD RIGHT HAND  Final   Special Requests   Final    IN PEDIATRIC BOTTLE Blood Culture results may not be optimal due to an excessive volume of blood received in culture bottles   Culture NO GROWTH 5 DAYS  Final   Report Status 10/13/2016 FINAL  Final  MRSA PCR Screening     Status: Abnormal   Collection Time: 10/13/16  2:44 PM  Result Value Ref Range Status   MRSA by PCR POSITIVE (A) NEGATIVE Final    Comment:        The GeneXpert MRSA Assay (FDA approved for NASAL specimens only), is one component  of a comprehensive MRSA colonization surveillance program. It is not intended to diagnose MRSA infection nor to guide or monitor treatment for MRSA infections. RESULT CALLED TO, READ BACK BY AND VERIFIED WITH: R. Greenawalt RN 17:30 10/13/16 ([email or web address])          Radiology Studies: Mr Shoulder Left W Wo Contrast  Result Date: 10/14/2016 CLINICAL DATA:  Left all arm and shoulder pain scratch the left arm and shoulder pain with weakness. History of myeloproliferative disorder. Fever. EXAM: MRI OF THE LEFT SHOULDER WITHOUT AND WITH CONTRAST TECHNIQUE: Multiplanar, multisequence MR imaging of the left shoulder was performed before and after the administration of intravenous contrast. CONTRAST:  8 ml MULTIHANCE GADOBENATE DIMEGLUMINE 529 MG/ML IV SOLN COMPARISON:  Plain films left shoulder 10/09/2016. FINDINGS: Patient motion degrades the exam. Rotator cuff: The patient has supraspinatus and infraspinatus tendinopathy. There is a partial width, full-thickness tear of the posterior fibers of the supraspinatus measuring approximately 0.7 cm from front to back with retraction of 1-2 cm. The rotator cuff is otherwise intact. Muscles:  No focal atrophy or lesion. Biceps long head:  Intact. Acromioclavicular Joint: Mild degenerative change is seen. Type 1 acromion. No subacromial/subdeltoid bursal fluid. Glenohumeral Joint: Negative. Labrum:  Grossly intact. Bones: Abnormal marrow signal is present in all imaged bones. No fracture is identified. No evidence of osteomyelitis. Other: None. IMPRESSION: Abnormal marrow signal in all imaged bones consistent with the patient's history of myeloproliferative disorder, multiple myeloma or less likely metastatic disease. Negative for evidence of infectious process. Rotator cuff tendinopathy with a 0.7 cm full-thickness tear of the posterior supraspinatus with mild retraction and no atrophy. Electronically Signed   By: Inge Rise M.D.   On: 10/14/2016 09:28         Scheduled Meds: . aspirin  325 mg Oral Daily  . azithromycin  500 mg Oral Daily  . carvedilol  3.125 mg Oral BID WC  . Chlorhexidine Gluconate Cloth  6 each Topical Q0600  . enoxaparin (LOVENOX) injection  40 mg Subcutaneous Q24H  . furosemide  40 mg Intravenous Q12H  . isosorbide dinitrate  30 mg Oral BID  . lidocaine   Topical TID  . mouth rinse  15 mL Mouth Rinse BID  .  methocarbamol  750 mg Oral TID  . mupirocin ointment  1 application Nasal BID  . polyethylene glycol  17 g Oral Daily   Continuous Infusions: . cefTRIAXone (ROCEPHIN)  IV Stopped (10/14/16 0630)     LOS: 5 days    Dron Tanna Furry, MD [facility] Pager [phone]  If 7PM-7AM, please contact night-coverage [email or web address] Password [email or web address] 10/14/2016, 11:40 AM

## 2016-10-15 ENCOUNTER — Inpatient Hospital Stay (HOSPITAL_COMMUNITY): Payer: Medicare Other

## 2016-10-15 ENCOUNTER — Encounter (HOSPITAL_COMMUNITY): Payer: Self-pay

## 2016-10-15 DIAGNOSIS — I272 Pulmonary hypertension, unspecified: Secondary | ICD-10-CM

## 2016-10-15 LAB — BASIC METABOLIC PANEL
Anion gap: 9 (ref 5–15)
BUN: 51 mg/dL — ABNORMAL HIGH (ref 6–20)
CO2: 25 mmol/L (ref 22–32)
Calcium: 9.3 mg/dL (ref 8.9–10.3)
Chloride: 103 mmol/L (ref 101–111)
Creatinine, Ser: 1.51 mg/dL — ABNORMAL HIGH (ref 0.61–1.24)
GFR, EST AFRICAN AMERICAN: 51 mL/min — AB (ref >60)
GFR, EST NON AFRICAN AMERICAN: 44 mL/min — AB (ref >60)
Glucose, Bld: 91 mg/dL (ref 65–99)
POTASSIUM: 5.3 mmol/L — AB (ref 3.5–5.1)
SODIUM: 137 mmol/L (ref 135–145)

## 2016-10-15 LAB — D-DIMER, QUANTITATIVE: D-Dimer, Quant: 0.76 ug/mL-FEU — ABNORMAL HIGH (ref 0.00–0.50)

## 2016-10-15 MED ORDER — TECHNETIUM TC 99M DIETHYLENETRIAME-PENTAACETIC ACID
30.0000 | Freq: Once | INTRAVENOUS | Status: AC
  Administered 2016-10-15: 30 via RESPIRATORY_TRACT

## 2016-10-15 MED ORDER — TECHNETIUM TO 99M ALBUMIN AGGREGATED
4.0000 | Freq: Once | INTRAVENOUS | Status: AC
  Administered 2016-10-15: 4 via INTRAVENOUS

## 2016-10-15 MED ORDER — FUROSEMIDE 40 MG PO TABS
40.0000 mg | ORAL_TABLET | Freq: Every evening | ORAL | Status: DC
  Administered 2016-10-15: 40 mg via ORAL
  Filled 2016-10-15: qty 1

## 2016-10-15 MED ORDER — FUROSEMIDE 80 MG PO TABS: 80.0000 mg | ORAL_TABLET | Freq: Every day | ORAL | Status: DC

## 2016-10-15 NOTE — Consult Note (Signed)
Advanced Heart Failure Team Consult Note   Primary Physician: Colon Branch, MD  Primary Cardiologist:  Dr. Aundra Dubin   Reason for Consultation: Acute on chronic diastolic CHF  HPI:    Juan [name] is seen today for evaluation of acute on chronic diastolic CHF at the request of Dr. Carolin Sicks.   Mr Legler is a 76 year old male with a past medical history of chronic diastolic CHF, CKD stage III, OSA, and myeloproliferative disorder.   Mr. Ladnier has been admitted 3 times in the past 6 months for acute on chronic diastolic CHF. Last admitted in December 2017 with volume overload, diuresed with IV lasix, discharge weight was 198 pounds.   He has been following up in the CHF clinic and recently referred for paramedicine. He continues to struggle with diet noncompliance, eats high salt foods daily. Also drinks more than 2L a day. Last weight in clinic on 10/02/16 was 200 pounds.   He presented to the ED on 10/08/16 with left arm and shoulder pain, fever and volume overload. Pertinent admission labs - creatinine 1.41, BNP 328.5. Chest X ray with pulmonary edema. Admission weight was 204 pounds. He was started on IV antibiotics for CAP, started on IV lasix for diuresis.   He says that he feels much better today, weight is down to 191 pounds. He denies chest pain, orthopnea and PND. He appears euvolemic on exam. He did receive a left shoulder injection for a left supraspinous tear.   Echo today LVEF 60% RV markedly dilated and HK. RVSP ~52mmHG   Review of Systems: [y] = yes, [ ]  = no   General: Weight gain [ y]; Weight loss [ ] ; Anorexia [ ] ; Fatigue [ y]; Fever [ ] ; Chills [ ] ; Weakness [ ]   Cardiac: Chest pain/pressure [ ] ; Resting SOB [ ] ; Exertional SOB [ ] ; Orthopnea [ ] ; Pedal Edema [ ] ; Palpitations [ ] ; Syncope [ ] ; Presyncope [ ] ; Paroxysmal nocturnal dyspnea[ ]   Pulmonary: Cough [ ] ; Wheezing[ ] ; Hemoptysis[ ] ; Sputum [ ] ; Snoring [ ]   GI: Vomiting[ ] ; Dysphagia[ ] ; Melena[ ] ; Hematochezia [ ] ;  Heartburn[ ] ; Abdominal pain [ ] ; Constipation [ ] ; Diarrhea [ ] ; BRBPR [ ]   GU: Hematuria[ ] ; Dysuria [ ] ; Nocturia[ ]   Vascular: Pain in legs with walking [ ] ; Pain in feet with lying flat [ ] ; Non-healing sores [ ] ; Stroke [ ] ; TIA [ ] ; Slurred speech [ ] ;  Neuro: Headaches[ ] ; Vertigo[ ] ; Seizures[ ] ; Paresthesias[ ] ;Blurred vision [ ] ; Diplopia [ ] ; Vision changes [ ]   Ortho/Skin: Arthritis Blue.Reese ]; Joint pain [ ] ; Muscle pain [ ] ; Joint swelling [ ] ; Back Pain [ ] ; Rash [ ]   Psych: Depression[ ] ; Anxiety[ ]   Heme: Bleeding problems [ ] ; Clotting disorders [ ] ; Anemia [ ]   Endocrine: Diabetes [ ] ; Thyroid dysfunction[ ]   Home Medications Prior to Admission medications   Medication Sig Start Date End Date Taking? Authorizing Provider  acetaminophen (TYLENOL) 500 MG tablet Take 500-1,000 mg by mouth every 6 (six) hours as needed.   Yes [provider]  aspirin 325 MG tablet Take 325 mg by mouth daily.   Yes [provider]  B Complex Vitamins (VITAMIN B COMPLEX PO) Take 100 mg by mouth daily.   Yes [provider]  bisacodyl (DULCOLAX) 10 MG suppository Place 1 suppository (10 mg total) rectally daily as needed for moderate constipation. 04/18/16  Yes Hongalgi, Lenis Dickinson, MD  carvedilol (COREG)  3.125 MG tablet Take 1 tablet (3.125 mg total) by mouth 2 (two) times daily with a meal. 10/02/16  Yes Tillery, Satira Mccallum, PA-C  dextromethorphan (DELSYM) 30 MG/5ML liquid Take 5 mLs (30 mg total) by mouth at bedtime as needed for cough. 05/12/16  Yes Elgergawy, Silver Huguenin, MD  diclofenac sodium (VOLTAREN) 1 % GEL Apply 4 g topically 2 (two) times daily as needed (pain). Apply to bilateral  knees    Yes [provider]  feeding supplement, GLUCERNA SHAKE, (GLUCERNA SHAKE) LIQD Take 118.5 mLs by mouth daily. Reported on 08/21/2015   Yes [provider]  FERROUS GLUCONATE PO Take 240 mg by mouth daily at 10 pm. 1 tablet   Yes [provider]  fluticasone  (FLONASE) 50 MCG/ACT nasal spray Place 2 sprays into both nostrils daily. Reported on 05/02/2015   Yes [provider]  furosemide (LASIX) 40 MG tablet Take 1-2 tablets (40-80 mg total) by mouth as directed. 80 MG (2 TABS) IN THE AM AND 40 MG (1 TAB) IN THE PM 09/24/16  Yes Jettie Booze E, NP  gabapentin (NEURONTIN) 100 MG capsule Take 100 mg by mouth 3 (three) times daily as needed (pain). Reported on 08/22/2015   Yes [provider]  guaiFENesin (MUCINEX) 600 MG 12 hr tablet Take 2 tablets (1,200 mg total) by mouth 2 (two) times daily. Patient taking differently: Take 1,200 mg by mouth 2 (two) times daily as needed for cough or to loosen phlegm.  04/04/16  Yes Verlee Monte, MD  hydrALAZINE (APRESOLINE) 50 MG tablet Take 1 tablet (50 mg total) by mouth 3 (three) times daily. 10/02/16  Yes Shirley Friar, PA-C  hydrocerin (EUCERIN) CREA Apply Eucerin cream to BLE Q day after bathing and roughly towel drying to remove loose skin Patient taking differently: Apply 1 application topically daily as needed (dryness).  01/27/14  Yes Ghimire, Henreitta Leber, MD  HYDROcodone-acetaminophen (NORCO/VICODIN) 5-325 MG tablet Take 1 tablet by mouth every 8 (eight) hours. routinely Patient taking differently: Take 1 tablet by mouth every 6 (six) hours as needed for moderate pain. routinely  05/12/16  Yes Elgergawy, Silver Huguenin, MD  isosorbide dinitrate (ISORDIL) 30 MG tablet Take 1 tablet (30 mg total) by mouth 2 (two) times daily. 10/02/16  Yes Shirley Friar, PA-C  metolazone (ZAROXOLYN) 2.5 MG tablet Take 1 tablet (2.5 mg total) by mouth as directed. Patient taking differently: Take 2.5 mg by mouth See admin instructions. When instructed by MD to take 09/24/16 09/24/17 Yes Arbutus Leas, NP  potassium chloride SA (K-DUR,KLOR-CON) 20 MEQ tablet Take 1 tablet (20 mEq total) by mouth as directed. WITH METOLAZONE DOSE ONLY 09/24/16  Yes Arbutus Leas, NP  simethicone (MYLICON) 80 MG chewable tablet  Chew 80 mg by mouth every 6 (six) hours as needed for flatulence.   Yes [provider]    Past Medical History: Past Medical History:  Diagnosis Date  . Allergic rhinitis   . Anemia   . Ascending aortic aneurysm ([facility]) 03/2014   4.3cm on CT scan  . CAD (coronary artery disease)    dx elsewheer in past, no documentation. Non-ischemic myoview 2007  . Chronic diastolic CHF (congestive heart failure), NYHA class 2 (HCC)    Normal EF w/ grade 1 dd by echo 12/2015  . Edema    R>L leg, u/s 5-12 neg for DVT  . Hemorrhoid   . History of thrombocytosis   . Hypertension   . Migraine    "  once/wk at least" (07/11/2013)  . Myeloproliferative disease ([facility])   . Shortness of breath   . Sinus congestion   . Sleep apnea, obstructive    at some point used CPAP, was d/c  years ago  . Type II diabetes mellitus ([facility])     Past Surgical History: Past Surgical History:  Procedure Laterality Date  . TOE SURGERY Right    "tried to straighten out big toe" (07/11/2013)    Family History: Family History  Problem Relation Age of Onset  . Schizophrenia Son   . Colon cancer Neg Hx   . Prostate cancer Neg Hx   . Heart attack Neg Hx   . Diabetes Neg Hx     Social History: Social History   Social History  . Marital status: Widowed    Spouse name: N/A  . Number of children: 2  . Years of education: N/A   Occupational History  . retired    .  Retired   Social History Main Topics  . Smoking status: Former Smoker    Packs/day: 0.25    Years: 12.00    Types: Cigarettes    Quit date: 05/18/1966  . Smokeless tobacco: Never Used     Comment: quit smoking 45 years ago  . Alcohol use No  . Drug use: No  . Sexual activity: Yes   Other Topics Concern  . None   Social History Narrative   Moved from [place] 2006   Widow 2007   Son lives with him     Allergies:  No Known Allergies  Objective:    Vital Signs:   Temp:  [97.8 F (36.6 C)-98.4 F (36.9 C)] 98.4 F (36.9 C) (05/31  0937) Pulse Rate:  [73-86] 73 (05/31 0937) Resp:  [18-20] 18 (05/31 0937) BP: (107-156)/(63-83) 107/63 (05/31 0937) SpO2:  [92 %-96 %] 96 % (05/31 0937) Weight:  [191 lb 14.4 oz (87 kg)] 191 lb 14.4 oz (87 kg) (05/31 0440) Last BM Date: 10/12/16  Weight change: Filed Weights   10/12/16 2334 10/13/16 2245 10/15/16 0440  Weight: 193 lb (87.5 kg) 192 lb 14.4 oz (87.5 kg) 191 lb 14.4 oz (87 kg)    Intake/Output:   Intake/Output Summary (Last 24 hours) at 10/15/16 1200 Last data filed at 10/15/16 5726  Gross per 24 hour  Intake              240 ml  Output             2325 ml  Net            -2085 ml     Physical Exam: General:  Well appearing, sitting up in chair.  HEENT: normal Neck: supple. JVP 5-6 cm. Carotids 2+ bilat; no bruits. No lymphadenopathy or thyromegaly appreciated. Cor: PMI nondisplaced. Regular rate & rhythm. +S4, no S3. No rubs, gallops or murmurs. Lungs: Clear in bilateral upper lobes, crackles in RLL Abdomen: soft, nontender, nondistended. No hepatosplenomegaly. No bruits or masses. Good bowel sounds. Extremities: no cyanosis, clubbing, rash, edema. Warm.  Neuro: alert & orientedx3, cranial nerves grossly intact. moves all 4 extremities w/o difficulty. Affect pleasant  Telemetry: NSR   Labs: Basic Metabolic Panel:  Recent Labs Lab 10/12/16 0415 10/12/16 1540 10/13/16 0753 10/14/16 0351 10/15/16 0553  NA 135 136 136 136 137  K 6.0* 5.3* 4.9 4.7 5.3*  CL 104 105 105 104 103  CO2 23 20* 21* 24 25  GLUCOSE 113* 125* 102* 111* 91  BUN 57* 61*  59* 54* 51*  CREATININE 2.07* 2.15* 1.93* 1.65* 1.51*  CALCIUM 9.6 9.4 9.1 9.1 9.3    Liver Function Tests: No results for input(s): AST, ALT, ALKPHOS, BILITOT, PROT, ALBUMIN in the last 168 hours. No results for input(s): LIPASE, AMYLASE in the last 168 hours. No results for input(s): AMMONIA in the last 168 hours.  CBC:  Recent Labs Lab 10/09/16 0437 10/13/16 0753 10/14/16 0351  WBC 11.4* 10.1 12.7*    HGB 10.8* 9.6* 9.7*  HCT 35.2* 31.6* 31.7*  MCV 81.9 82.3 81.3  PLT 627* 638* 770*    Cardiac Enzymes: No results for input(s): CKTOTAL, CKMB, CKMBINDEX, TROPONINI in the last 168 hours.  BNP: BNP (last 3 results)  Recent Labs  05/05/16 2027 10/08/16 0436 10/12/16 1030  BNP 182.5* 328.5* 989.3*    ProBNP (last 3 results) No results for input(s): PROBNP in the last 8760 hours.   CBG: No results for input(s): GLUCAP in the last 168 hours.  Coagulation Studies: No results for input(s): LABPROT, INR in the last 72 hours.  Other results: EKG: NSR. LVH. Non specific ST abnormalities. Personally reviewed    Medications:     Current Medications: . aspirin  325 mg Oral Daily  . azithromycin  500 mg Oral Daily  . bupivacaine  5 mL Infiltration Once  . carvedilol  3.125 mg Oral BID WC  . Chlorhexidine Gluconate Cloth  6 each Topical Q0600  . enoxaparin (LOVENOX) injection  40 mg Subcutaneous Q24H  . hydrALAZINE  25 mg Oral Q8H  . isosorbide dinitrate  30 mg Oral BID  . lidocaine   Topical TID  . lidocaine  1 mL Subcutaneous Once  . mouth rinse  15 mL Mouth Rinse BID  . methocarbamol  750 mg Oral TID  . methylPREDNISolone acetate  2 mg Intra-articular Once  . mupirocin ointment  1 application Nasal BID  . polyethylene glycol  17 g Oral Daily     Infusions: . cefTRIAXone (ROCEPHIN)  IV Stopped (10/15/16 0716)      Assessment/Plan   1. Acute on chronic diastolic CHF:  - NYHA II-III. He is mostly sedentary at baseline, wheelchair bound.  - Volume status stable to dry.  - Stop IV lasix today. Resume outpatient regimen of po Lasix 80mg  in the am, 40mg  in the pm.  - Continue Coreg 3.125 mg BID.  - No Spiro with history of noncompliance.  - We will see him in the clinic next week.  2. HTN: - Well controlled on current regimen 3. CAP:  - On azithromycin, ceftriaxone per primary team 4. Left upper shoulder pain - S/p joint injection 5. CKD stage III -  Stable. Will repeat BMET at clinic visit.  6. Pulmonary HTN with RV strain on echo.  - Evaluate for PE and other cuases PAH  Length of Stay: Dows, NP  10/15/2016, 12:00 PM  Advanced Heart Failure Team Pager [phone] (M-F; [place])  Please contact [facility] for night-coverage after hours (4p -7a ) and weekends on [email or web address]  Patient seen and examined with Jettie Booze, NP. We discussed all aspects of the encounter. I agree with the assessment and plan as stated above.   On exam he looks dry. Can stop IV lasix for now and switch back to po diuretics. CXR c/w multilobar PNA.   Echo reviewed personally and LVEF normal. RV markedly dilated and hypokinetic with D-shaped septum. PA systolic pressure about 31VQMG, This is new finding. Unclear etiology.  May be related to PNA but seems well out of proportion. Needs w/u for PE and other causes of PH. With recent AKI will start with D-dimer, VQ and LE u/s. Doubt auto-immune but will check RF, ANA, ANCA profiles. Will need outpatient sleep study and PFTs. Check overnight oximetry tonight.  Glori Bickers, MD  2:10 PM

## 2016-10-15 NOTE — Care Management Important Message (Signed)
Important Message  Patient Details  Name: Juan [name] MRN: [id number] Date of Birth: 1940-10-24   Medicare Important Message Given:  Yes    Bani Gianfrancesco, Rory Percy, RN 10/15/2016, 3:14 PM

## 2016-10-15 NOTE — Progress Notes (Addendum)
Physical Therapy Treatment Patient Details Name: Juan [name] MRN: [id number] DOB: 05-Jul-1940 Today's Date: 10/15/2016    History of Present Illness Pt is a 76 yo male admitted through ED on 10/08/16 with acute L arm pain which has persisted since being hospitalized which was found to be related to his myeloproliferative disorder. 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     PT Comments    Pt is refusing any transfer or gait training this session due to left shoulder pain. Per MD notes, pt does have a supraspinatus tear and received a cortisone injection yesterday. Pt continues to requires minimal encouragement to participate in exercises but refuses any transfers or gait in fear of harming his shoulder.    Follow Up Recommendations  Home health PT;Supervision/Assistance - 24 hour     Equipment Recommendations  Rolling walker with 5" wheels    Recommendations for Other Services OT consult     Precautions / Restrictions Precautions Precautions: Fall Restrictions Weight Bearing Restrictions: No    Mobility  Bed Mobility               General bed mobility comments: sitting in recliner when PT arrives  Transfers                 General transfer comment: Pt refused any transfer or gait training  Ambulation/Gait             General Gait Details: pt choosing not to ambulate due to pain in left shoudler   Stairs            Wheelchair Mobility    Modified Rankin (Stroke Patients Only)       Balance                                            Cognition Arousal/Alertness: Awake/alert Behavior During Therapy: WFL for tasks assessed/performed Overall Cognitive Status: Within Functional Limits for tasks assessed                                        Exercises General Exercises - Upper Extremity Shoulder  Flexion: AAROM;Left;10 reps;Seated Shoulder ABduction: AAROM;Left;10 reps;Seated Shoulder Horizontal ABduction: AAROM;Left;10 reps;Seated Elbow Flexion: AAROM;Left;10 reps;Seated Elbow Extension: AAROM;Left;10 reps;Seated General Exercises - Lower Extremity Long Arc Quad: AROM;Left;10 reps;Seated Hip Flexion/Marching: AROM;Left;10 reps;Seated    General Comments        Pertinent Vitals/Pain Pain Assessment: Faces Faces Pain Scale: Hurts even more Pain Location: left shoulder Pain Descriptors / Indicators: Aching;Grimacing;Guarding Pain Intervention(s): Monitored during session;Relaxation    Home Living                      Prior Function            PT Goals (current goals can now be found in the care plan section) Acute Rehab PT Goals Patient Stated Goal: to feel better Progress towards PT goals: Progressing toward goals    Frequency    Min 3X/week      PT Plan Current plan remains appropriate    Co-evaluation              AM-PAC PT "6 Clicks" Daily Activity  Outcome Measure  Difficulty turning over in bed (including adjusting bedclothes, sheets and blankets)?: Total Difficulty moving from lying on back to sitting on the side of the bed? : Total Difficulty sitting down on and standing up from a chair with arms (e.g., wheelchair, bedside commode, etc,.)?: A Lot Help needed moving to and from a bed to chair (including a wheelchair)?: A Little Help needed walking in hospital room?: A Little Help needed climbing 3-5 steps with a railing? : A Lot 6 Click Score: 12    End of Session Equipment Utilized During Treatment: Gait belt;Oxygen Activity Tolerance: Patient tolerated treatment well Patient left: in chair;with call [name]/phone within reach;with chair alarm set Nurse Communication: Mobility status PT Visit Diagnosis: Unsteadiness on feet (R26.81);Muscle weakness (generalized) (M62.81);Pain Pain - Right/Left: Left Pain - part of body: Shoulder      Time: 7510-2585 PT Time Calculation (min) (ACUTE ONLY): 11 min  Charges:  $Therapeutic Exercise: 8-22 mins                    G Codes:       Scheryl Marten PT, DPT  [phone]    Shanon Rosser 10/15/2016, 1:11 PM

## 2016-10-15 NOTE — Progress Notes (Addendum)
PROGRESS NOTE    Juan [name]  FBP:[id number] DOB: April 03, 1941 DOA: 10/08/2016 PCP: Colon Branch, MD   Brief Narrative: 76 y.o. male with a past medical history significant for CHF EF 55%, CKD III baseline Cr 1.4, OSA not on CPAP, and myeloproliferative D/o who presents with left arm pain, found incidentally to have CHF flare, fever.  In the ER patient was found to have temperature of 100.4, oxygen saturation of 89% on room air, UA with UTI.  Assessment & Plan:  #Acute on chronic diastolic congestive heart failure:  -Patient has a cough and elevated BNP on admission. Treated with IV Lasix with clinical improvement. Switched to oral Lasix home dose today. Shortness of breath and cough is improved however he still requiring oxygen. Try to wean off oxygen today. Echocardiogram showed severe pulmonary hypertension with peak pulmonary arteries pressure of 85 which is new to the patient. I consulted cardiologist today to evaluate severe pulmonary hypertension. Discussed with the cardiology team regarding the consult reason. - On aspirin, Coreg,  Isordil, hydralazine   #Possible multilobar bronchopneumonia: Continue azithromycin till tomorrow. Completed a week course of ceftriaxone -Continue bronchodilators and breathing treatment. Currently on 2 L of oxygen. Try to wean down to room air. Also ordered ambulatory oxygen saturation assessment before discharge planning.  #Acute kidney injury on Chronic kidney disease stage III with hyperkalemia:  -Bladder scan consistent with more than 750 mL of urine on 5/28. Placed Foley catheter. Serum creatinine level improved. Switching to oral Lasix today. Plan to discontinue Foley catheter today and bladder scan to make sure he does not develop urinary retention. Discussed with the patient's.  #Left upper extremity pain and numbness likely due to rotator cuff tear.: Patient has left upper extremity pain and difficulty to elevate. X-ray of the shoulder with no acute  finding. Doppler ultrasound of left upper extremity with no thrombosis or DVT. MRI of cervical spine with chronic degenerative changes with no spinal cord stenosis.  -Orthopedics consulted with recommended MRI of the left shoulder. Finally patient agreed for MRI. This consistent with rotator cuff tear. Reevaluate by orthopedics is status post injection of steroid. Continue PTOT evaluation and outpatient follow-up with orthopedics. Continue oral Robaxin Neurontin and oxycodone as needed. Discontinue IV pain medication. Continue local Xylocaine.   #Constipation: Bowel regimen including lactulose, MiraLAX and Colace. Monitor  #History of myeloproliferative disorder: seen by oncologist in the past. Recommended outpatient follow-up.  Principal Problem:   Acute on chronic diastolic CHF (congestive heart failure) (HCC) Active Problems:   CKD (chronic kidney disease) stage 3, GFR 30-59 ml/min   Agnogenic myeloid metaplasia (HCC)   Type II diabetes mellitus (HCC)   CHF (congestive heart failure) (HCC)   Fever   Left arm pain   Acute kidney injury superimposed on CKD (HCC)   Hyperkalemia   Sprain of left rotator cuff capsule  DVT prophylaxis: Order low-dose Lovenox. No sign of bleeding. Code Status: Full code Family Communication: No family at bedside Disposition Plan: Likely discharge home in 1-2 days. Pending cardiology consult, DC Foley, wean oxygen to room air and check ambulatory oxygen assessment today.    Consultants:   Orthopedics  Cardiology  Procedures: None Antimicrobials: Ceftriaxone since 5/24.5/31 Azithromycin till tomorrow Subjective: Seen and examined at bedside. Still complaining of neck pain and discomfort. Denied chest pain, shortness of breath, nausea or vomiting. Objective: Vitals:   10/14/16 1753 10/14/16 2110 10/15/16 0440 10/15/16 0937  BP: (!) 156/83 136/76 116/66 107/63  Pulse: 86 84 85 73  Resp: '18 20 18 18  '$ Temp: 97.8 F (36.6 C) 98.2 F (36.8 C) 98 F  (36.7 C) 98.4 F (36.9 C)  TempSrc: Oral Oral Oral Oral  SpO2: 96% 95% 92% 96%  Weight:   87 kg (191 lb 14.4 oz)   Height:        Intake/Output Summary (Last 24 hours) at 10/15/16 1359 Last data filed at 10/15/16 5056  Gross per 24 hour  Intake              240 ml  Output             2325 ml  Net            -2085 ml   Filed Weights   10/12/16 2334 10/13/16 2245 10/15/16 0440  Weight: 87.5 kg (193 lb) 87.5 kg (192 lb 14.4 oz) 87 kg (191 lb 14.4 oz)    Examination:  General exam:Lying on bed comfortable Respiratory system: Clear bilateral, no wheezing  Cardiovascular system: Regular rate rhythm, S1 and S2 normal. Gastrointestinal system: Abdomen soft, nontender, nondistended. Bowel sound positive.. Central nervous system: Alert awake and following commands  Extremities: No lower extremity edema. Improving left upper extremities movement  Skin: No rashes, lesions or ulcers Psychiatry: Judgement and insight appear normal. Mood & affect appropriate.     Data Reviewed: I have personally reviewed following labs and imaging studies  CBC:  Recent Labs Lab 10/09/16 0437 10/13/16 0753 10/14/16 0351  WBC 11.4* 10.1 12.7*  HGB 10.8* 9.6* 9.7*  HCT 35.2* 31.6* 31.7*  MCV 81.9 82.3 81.3  PLT 627* 638* 979*   Basic Metabolic Panel:  Recent Labs Lab 10/12/16 0415 10/12/16 1540 10/13/16 0753 10/14/16 0351 10/15/16 0553  NA 135 136 136 136 137  K 6.0* 5.3* 4.9 4.7 5.3*  CL 104 105 105 104 103  CO2 23 20* 21* 24 25  GLUCOSE 113* 125* 102* 111* 91  BUN 57* 61* 59* 54* 51*  CREATININE 2.07* 2.15* 1.93* 1.65* 1.51*  CALCIUM 9.6 9.4 9.1 9.1 9.3   GFR: Estimated Creatinine Clearance: 51.3 mL/min (A) (by C-G formula based on SCr of 1.51 mg/dL (H)). Liver Function Tests: No results for input(s): AST, ALT, ALKPHOS, BILITOT, PROT, ALBUMIN in the last 168 hours. No results for input(s): LIPASE, AMYLASE in the last 168 hours. No results for input(s): AMMONIA in the last 168  hours. Coagulation Profile: No results for input(s): INR, PROTIME in the last 168 hours. Cardiac Enzymes: No results for input(s): CKTOTAL, CKMB, CKMBINDEX, TROPONINI in the last 168 hours. BNP (last 3 results) No results for input(s): PROBNP in the last 8760 hours. HbA1C: No results for input(s): HGBA1C in the last 72 hours. CBG: No results for input(s): GLUCAP in the last 168 hours. Lipid Profile: No results for input(s): CHOL, HDL, LDLCALC, TRIG, CHOLHDL, LDLDIRECT in the last 72 hours. Thyroid Function Tests: No results for input(s): TSH, T4TOTAL, FREET4, T3FREE, THYROIDAB in the last 72 hours. Anemia Panel: No results for input(s): VITAMINB12, FOLATE, FERRITIN, TIBC, IRON, RETICCTPCT in the last 72 hours. Sepsis Labs:  Recent Labs Lab 10/10/16 0453  PROCALCITON 0.20    Recent Results (from the past 240 hour(s))  Urine culture     Status: Abnormal   Collection Time: 10/08/16  4:19 AM  Result Value Ref Range Status   Specimen Description URINE, RANDOM  Final   Special Requests NONE  Final   Culture MULTIPLE SPECIES PRESENT, SUGGEST RECOLLECTION (A)  Final   Report Status 10/09/2016  FINAL  Final  Blood Culture (routine x 2)     Status: None   Collection Time: 10/08/16  4:35 AM  Result Value Ref Range Status   Specimen Description BLOOD RIGHT ARM  Final   Special Requests   Final    BOTTLES DRAWN AEROBIC AND ANAEROBIC Blood Culture adequate volume   Culture NO GROWTH 5 DAYS  Final   Report Status 10/13/2016 FINAL  Final  Blood Culture (routine x 2)     Status: None   Collection Time: 10/08/16  4:36 AM  Result Value Ref Range Status   Specimen Description BLOOD RIGHT HAND  Final   Special Requests   Final    IN PEDIATRIC BOTTLE Blood Culture results may not be optimal due to an excessive volume of blood received in culture bottles   Culture NO GROWTH 5 DAYS  Final   Report Status 10/13/2016 FINAL  Final  MRSA PCR Screening     Status: Abnormal   Collection Time:  10/13/16  2:44 PM  Result Value Ref Range Status   MRSA by PCR POSITIVE (A) NEGATIVE Final    Comment:        The GeneXpert MRSA Assay (FDA approved for NASAL specimens only), is one component of a comprehensive MRSA colonization surveillance program. It is not intended to diagnose MRSA infection nor to guide or monitor treatment for MRSA infections. RESULT CALLED TO, READ BACK BY AND VERIFIED WITH: R. Greenawalt RN 17:30 10/13/16 ([email or web address])          Radiology Studies: Mr Shoulder Left W Wo Contrast  Result Date: 10/14/2016 CLINICAL DATA:  Left all arm and shoulder pain scratch the left arm and shoulder pain with weakness. History of myeloproliferative disorder. Fever. EXAM: MRI OF THE LEFT SHOULDER WITHOUT AND WITH CONTRAST TECHNIQUE: Multiplanar, multisequence MR imaging of the left shoulder was performed before and after the administration of intravenous contrast. CONTRAST:  8 ml MULTIHANCE GADOBENATE DIMEGLUMINE 529 MG/ML IV SOLN COMPARISON:  Plain films left shoulder 10/09/2016. FINDINGS: Patient motion degrades the exam. Rotator cuff: The patient has supraspinatus and infraspinatus tendinopathy. There is a partial width, full-thickness tear of the posterior fibers of the supraspinatus measuring approximately 0.7 cm from front to back with retraction of 1-2 cm. The rotator cuff is otherwise intact. Muscles:  No focal atrophy or lesion. Biceps long head:  Intact. Acromioclavicular Joint: Mild degenerative change is seen. Type 1 acromion. No subacromial/subdeltoid bursal fluid. Glenohumeral Joint: Negative. Labrum:  Grossly intact. Bones: Abnormal marrow signal is present in all imaged bones. No fracture is identified. No evidence of osteomyelitis. Other: None. IMPRESSION: Abnormal marrow signal in all imaged bones consistent with the patient's history of myeloproliferative disorder, multiple myeloma or less likely metastatic disease. Negative for evidence of infectious process. Rotator  cuff tendinopathy with a 0.7 cm full-thickness tear of the posterior supraspinatus with mild retraction and no atrophy. Electronically Signed   By: Inge Rise M.D.   On: 10/14/2016 09:28        Scheduled Meds: . aspirin  325 mg Oral Daily  . azithromycin  500 mg Oral Daily  . bupivacaine  5 mL Infiltration Once  . carvedilol  3.125 mg Oral BID WC  . Chlorhexidine Gluconate Cloth  6 each Topical Q0600  . enoxaparin (LOVENOX) injection  40 mg Subcutaneous Q24H  . furosemide  40 mg Oral QPM  . [START ON 10/16/2016] furosemide  80 mg Oral Daily  . hydrALAZINE  25 mg Oral Q8H  . isosorbide  dinitrate  30 mg Oral BID  . lidocaine   Topical TID  . lidocaine  1 mL Subcutaneous Once  . mouth rinse  15 mL Mouth Rinse BID  . methocarbamol  750 mg Oral TID  . methylPREDNISolone acetate  2 mg Intra-articular Once  . mupirocin ointment  1 application Nasal BID  . polyethylene glycol  17 g Oral Daily   Continuous Infusions:    LOS: 6 days    Dron Tanna Furry, MD [facility] Pager [phone]  If 7PM-7AM, please contact night-coverage [email or web address] Password [email or web address] 10/15/2016, 1:59 PM

## 2016-10-16 ENCOUNTER — Inpatient Hospital Stay (HOSPITAL_COMMUNITY): Payer: Medicare Other

## 2016-10-16 DIAGNOSIS — N179 Acute kidney failure, unspecified: Secondary | ICD-10-CM

## 2016-10-16 DIAGNOSIS — Z22322 Carrier or suspected carrier of Methicillin resistant Staphylococcus aureus: Secondary | ICD-10-CM

## 2016-10-16 DIAGNOSIS — J069 Acute upper respiratory infection, unspecified: Secondary | ICD-10-CM

## 2016-10-16 DIAGNOSIS — N189 Chronic kidney disease, unspecified: Secondary | ICD-10-CM

## 2016-10-16 DIAGNOSIS — E875 Hyperkalemia: Secondary | ICD-10-CM

## 2016-10-16 DIAGNOSIS — S43422D Sprain of left rotator cuff capsule, subsequent encounter: Secondary | ICD-10-CM

## 2016-10-16 DIAGNOSIS — I2721 Secondary pulmonary arterial hypertension: Secondary | ICD-10-CM

## 2016-10-16 DIAGNOSIS — N183 Chronic kidney disease, stage 3 (moderate): Secondary | ICD-10-CM

## 2016-10-16 LAB — BASIC METABOLIC PANEL
ANION GAP: 10 (ref 5–15)
BUN: 57 mg/dL — ABNORMAL HIGH (ref 6–20)
CHLORIDE: 104 mmol/L (ref 101–111)
CO2: 23 mmol/L (ref 22–32)
CREATININE: 1.7 mg/dL — AB (ref 0.61–1.24)
Calcium: 9.6 mg/dL (ref 8.9–10.3)
GFR calc non Af Amer: 38 mL/min — ABNORMAL LOW (ref >60)
GFR, EST AFRICAN AMERICAN: 44 mL/min — AB (ref >60)
Glucose, Bld: 100 mg/dL — ABNORMAL HIGH (ref 65–99)
POTASSIUM: 5.4 mmol/L — AB (ref 3.5–5.1)
SODIUM: 137 mmol/L (ref 135–145)

## 2016-10-16 LAB — ANCA TITERS
Atypical P-ANCA titer: <1:20 {titer}
C-ANCA: <1:20 {titer}
P-ANCA: <1:20 {titer}

## 2016-10-16 MED ORDER — SODIUM POLYSTYRENE SULFONATE 15 GM/60ML PO SUSP
15.0000 g | Freq: Once | ORAL | Status: AC
  Administered 2016-10-16: 15 g via ORAL
  Filled 2016-10-16: qty 60

## 2016-10-16 MED ORDER — ASPIRIN EC 81 MG PO TBEC
81.0000 mg | DELAYED_RELEASE_TABLET | Freq: Every day | ORAL | Status: DC
  Administered 2016-10-16 – 2016-10-18 (×3): 81 mg via ORAL
  Filled 2016-10-16 (×3): qty 1

## 2016-10-16 NOTE — Progress Notes (Signed)
Advanced Heart Failure Rounding Note  PCP: Colon Branch, MD Primary Cardiologist: Dr. Aundra Dubin  Subjective:    Admitted with volume overload, also with CAP. PA pressures newly elevated on Echo.   Feels well today, denies SOB and orthopnea. Still complaining of pain in his left shoulder. Weight down ~15 pounds.   Objective:   Weight Range: 191 lb (86.6 kg) Body mass index is 23.87 kg/m.   Vital Signs:   Temp:  [98.3 F (36.8 C)-98.5 F (36.9 C)] 98.3 F (36.8 C) (06/01 0621) Pulse Rate:  [73-87] 80 (06/01 0621) Resp:  [18] 18 (06/01 0621) BP: (107-132)/(63-72) 122/69 (06/01 0621) SpO2:  [96 %-99 %] 99 % (06/01 0621) Weight:  [191 lb (86.6 kg)] 191 lb (86.6 kg) (05/31 2123) Last BM Date: 10/12/16  Weight change: Filed Weights   10/13/16 2245 10/15/16 0440 10/15/16 2123  Weight: 192 lb 14.4 oz (87.5 kg) 191 lb 14.4 oz (87 kg) 191 lb (86.6 kg)    Intake/Output:   Intake/Output Summary (Last 24 hours) at 10/16/16 4128 Last data filed at 10/16/16 0029  Gross per 24 hour  Intake              240 ml  Output              850 ml  Net             -610 ml     Physical Exam: General: Elderly male, NAD. Sitting in chair.  HEENT: normal Neck: supple.  No JVP. Carotids 2+ bilat; no bruits. No lymphadenopathy or thyromegaly appreciated. Cor: PMI nondisplaced. Regular rate and rhythm. No rubs, gallops or murmurs. Lungs: Clear bilaterally, normal effort.  Abdomen: soft, nontender, nondistended. No hepatosplenomegaly. No bruits or masses. Good bowel sounds. Extremities: no cyanosis, clubbing, rash. Warm, no edema.  Neuro: alert & orientedx3, cranial nerves grossly intact. moves all 4 extremities w/o difficulty. Affect pleasant   Telemetry: NSR   Labs: CBC  Recent Labs  10/14/16 0351  WBC 12.7*  HGB 9.7*  HCT 31.7*  MCV 81.3  PLT 786*   Basic Metabolic Panel  Recent Labs  10/15/16 0553 10/16/16 0446  NA 137 137  K 5.3* 5.4*  CL 103 104  CO2 25 23    GLUCOSE 91 100*  BUN 51* 57*  CREATININE 1.51* 1.70*  CALCIUM 9.3 9.6    BNP: BNP (last 3 results)  Recent Labs  05/05/16 2027 10/08/16 0436 10/12/16 1030  BNP 182.5* 328.5* 989.3*      D-Dimer  Recent Labs  10/15/16 1442  DDIMER 0.76*      Imaging/Studies:  Dg Chest 2 View  Result Date: 10/15/2016 CLINICAL DATA:  Follow-up pneumonia. Current history of CHF, stage 3 chronic kidney disease, myeloid metaplasia/myeloproliferative disease. EXAM: CHEST  2 VIEW COMPARISON:  10/12/2016, 10/08/2016 and earlier including CT chest 04/01/2016. FINDINGS: Cardiac silhouette markedly enlarged, unchanged. Pulmonary venous hypertension without evidence of pulmonary edema currently. Improved aeration in the lower lobes since the examination 3 days ago with only mild right lower lobe opacity persisting. No new pulmonary parenchymal abnormalities. Diffuse osteosclerosis as noted previously. IMPRESSION: Improved bilateral pneumonia since the examination 3 days ago with only minimal right lower lobe opacity persisting. No new abnormalities. Stable cardiomegaly without pulmonary edema currently. Electronically Signed   By: Evangeline Dakin M.D.   On: 10/15/2016 20:04   Nm Pulmonary Perf And Vent  Result Date: 10/15/2016 CLINICAL DATA:  Short of breath. Concern pulmonary embolism. Pulmonary hypertension. EXAM:  NUCLEAR MEDICINE VENTILATION - PERFUSION LUNG SCAN TECHNIQUE: Ventilation images were obtained in multiple projections using inhaled aerosol Tc-52m DTPA. Perfusion images were obtained in multiple projections after intravenous injection of Tc-81m MAA. LAO/RPO Oblique projections not performed. Patient became nauseous and vomited. RADIOPHARMACEUTICALS:  30.0 mCi Technetium-55m DTPA aerosol inhalation and 4.0 mCi Technetium-53m MAA IV COMPARISON:  Chest radiograph 10/15/2016 FINDINGS: Ventilation: No focal ventilation defect. Elevation RIGHT hemidiaphragm Perfusion: No wedge shaped peripheral  perfusion defects to suggest acute pulmonary embolism. IMPRESSION: No evidence acute or chronic pulmonary embolism. Electronically Signed   By: Suzy Bouchard M.D.   On: 10/15/2016 17:29     Transthoracic Echocardiography Study Conclusions  - Left ventricle: Septal flattening from elevated right sided   pressures. Wall thickness was increased in a pattern of severe   LVH. Systolic function was normal. The estimated ejection   fraction was in the range of 55% to 60%. Doppler parameters are   consistent with abnormal left ventricular relaxation (grade 1   diastolic dysfunction). - Aortic valve: There was mild regurgitation. - Mitral valve: Calcified annulus. There was mild regurgitation. - Left atrium: The atrium was moderately dilated. - Right ventricle: The cavity size was severely dilated. - Right atrium: The atrium was moderately dilated. - Pulmonary arteries: PA peak pressure: 85 mm Hg (S). - Impressions: Severe pulmonary hypertension.  Impressions:  - Severe pulmonary hypertension.      Medications:     Scheduled Medications: . aspirin  325 mg Oral Daily  . azithromycin  500 mg Oral Daily  . bupivacaine  5 mL Infiltration Once  . carvedilol  3.125 mg Oral BID WC  . Chlorhexidine Gluconate Cloth  6 each Topical Q0600  . enoxaparin (LOVENOX) injection  40 mg Subcutaneous Q24H  . furosemide  40 mg Oral QPM  . furosemide  80 mg Oral Daily  . hydrALAZINE  25 mg Oral Q8H  . isosorbide dinitrate  30 mg Oral BID  . lidocaine   Topical TID  . lidocaine  1 mL Subcutaneous Once  . mouth rinse  15 mL Mouth Rinse BID  . methocarbamol  750 mg Oral TID  . methylPREDNISolone acetate  2 mg Intra-articular Once  . mupirocin ointment  1 application Nasal BID  . polyethylene glycol  17 g Oral Daily     Infusions:   PRN Medications:  acetaminophen **OR** acetaminophen, alum & mag hydroxide-simeth, docusate sodium, gabapentin, guaiFENesin-dextromethorphan,  HYDROcodone-acetaminophen, ipratropium-albuterol, ondansetron (ZOFRAN) IV   Assessment/Plan   1. Pulmonary HTN with RV strain on Echo - PA pressure 23mm Hg on Echo.  - Over night oximetry with O2 sats in the 96-99% range.  - VQ scan negative for PE.  - ANCA, RF, and ANA pending.  - Will set up for outpatient sleep study when we see him in clinic next week. 2. Acute on chronic diastolic CHF:  - NYHA II-III. He is mostly sedentary at baseline, wheelchair bound.  - Weight stable, resume outpatient regimen of po Lasix 80mg  in the am and 40mg  in the pm when he is discharged. Would hold diuretics today.  - Continue Coreg 3.125 mg BID, will not increase with new RV dilitation.  - No Spiro with hyperkalemia.  - Follow up made in the CHF clinic for next week.  - Discussed with him the importance of dietary compliance.  - He is on 325mg  ASA, no history of CVA. Will change this to 81 mg daily.  3. HTN: - Well controlled on current regimen.  4.  CAP:  - On azithromycin, day 5. Blood cultures negative. Chest x ray with improved PNA from 3 days prior.  - Completed a weeks of ceftriaxone, there was question of a UTI as well.  - No change to current plan.  5. Left upper shoulder pain - S/p joint injection - pain improved.  6. CKD stage III - baseline creatinine 1.3-1.6 - Follow. Stable now.  7. Hyperkalemia - K 5.4. Will follow.  8. Myeloproliferative syndrome - Has refused treatment.   Length of Stay: [place], NP  10/16/2016, 8:08 AM  Advanced Heart Failure Team Pager [phone] (M-F; 7a - 4p)  Please contact [facility] for night-coverage after hours (4p -7a ) and weekends on [email or web address]  Patient seen with NP, agree with the above note.  He has diastolic CHF with prominent RV failure and severe pulmonary hypertension by echo.  He is very sedentary at baseline, uses wheelchair and occasionally a walker.  He was admitted with multilobar PNA and volume overload.  - Ongoing  antibiotics per primary service.  - Volume status is improved, now on po Lasix (holding today, restarting tommorow).  Creatinine fairly close to baseline.   Workup for PH is ongoing.  V/Q with no evidence for chronic PE.  Overnight oximetry did not show significant desaturation.  Rheumatologic serologies sent and will send HIV test.  Will let him recover from PNA and can see him back in clinic.  Will arrange sleep study.  Would consider RHC at that point, but he is at baseline very sedentary and is not inclined towards invasive evaluation, so not sure he would agree to it or that it would be particularly beneficial to him.   Loralie Champagne 10/16/2016 10:59 AM

## 2016-10-16 NOTE — Progress Notes (Signed)
PT Cancellation Note  Patient Details Name: Juan [name] MRN: [id number] DOB: 03/14/1941   Cancelled Treatment:    Reason Eval/Treat Not Completed: Patient declined, no reason specified. Pt reports that he is hungry and hasn't had any food. Pt does not want to get OOB this morning as he says his neck is hurting. Requests PT check back this afternoon. Will check back as time allows.    Scheryl Marten PT, DPT  [phone]  10/16/2016, 11:14 AM

## 2016-10-16 NOTE — Progress Notes (Signed)
Progress Note    Juan [name]  PPJ:[id number] DOB: [birth year]  DOA: 10/08/2016 PCP: Colon Branch, MD    Brief Narrative:   Chief complaint: F/U left shoulder/UE/hand pain, CHF.  Medical records reviewed and are as summarized below:  Juan [name] is an 76 y.o. male with a PMH of chronic diastolic CHF, medical noncompliance, stage III CKD (baseline creatinine 1.4), OSA not on CPAP, and myeloproliferative disorder (BCR/ABL, JAK2 negative) who was admitted 10/08/16 for evaluation of left upper extremity pain/left hand pain  Assessment/Plan:   Principal Problem:   Acute on chronic diastolic CHF (congestive heart failure) (HCC) With elevated BNP, chest x-ray showing pulmonary edema, and symptomatic CHF (worsening lower extremity edema, orthopnea, PND), the patient was felt to have decompensated CHF. Furosemide was increased to 80 mg twice a day. His dry weight was noted to be around 195 pounds. Current weight is 191 pounds. Lasix currently on hold with recommendations to resume outpatient regimen at discharge. Continue Coreg. No spironolactone secondary to hyperkalemia. Will need follow-up in heart failure clinic 1 week post discharge.  Active Problems:   AKI/CKD (chronic kidney disease) stage 3, GFR 30-59 ml/min/acute urinary retention Creatinine elevated greater than 2 on admission, noted to have urinary retention which resolved with placement of a Foley catheter. Baseline creatinine 1.3-1.6. Current creatinine 1.7.    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 Marin Olp in the past (consult note 05/07/16 reviewed). Apparently he has refused a bone marrow biopsy and does not want any treatment for this.    Type II diabetes mellitus ([place]) His last hemoglobin A1c was 5.8% on 07/28/16. Appears to be diet controlled.    Fever due to possible multilobar pneumonia Completed a course of ceftriaxone/azithromycin. Blood cultures negative.    Left arm pain/sprain of left rotator cuff  capsule Patient continues to report left shoulder pain. X-ray of the shoulder was negative. Doppler ultrasound of the left upper extremity was negative for DVT. MRI of the cervical spine showed chronic degenerative changes, no spinal cord stenosis. Orthopedics evaluated the patient and recommended an MRI which showed a left rotator cuff tear. Status post steroid injection. Continue PT/OT and symptomatic treatment with Robaxin, Neurontin, and oxycodone as needed. Local Xylocaine added for ongoing pain, which he continues to report.    Hyperkalemia We'll give 15 g of Kayexalate today.    MRSA carrier Continue contact precautions. To contamination therapy ordered.    Pulmonary hypertension (HCC) VQ scan negative for pulmonary embolism. No significant desaturations noted nocturnally. Rheumatologic serologies sent (RF, ANA, ANCA). Outpatient sleep study will be arranged. Cardiology considering right heart catheterization after other workup completed. Follow-up HIV testing.    Family Communication/Anticipated D/C date and plan/Code Status   DVT prophylaxis: Lovenox ordered. Code Status: Full Code.  Family Communication: No family currently at the bedside. Disposition Plan: SNF versus 24-hour supervision recommended by PT.   Medical Consultants:    Orthopedic Surgery  Cardiology   Procedures:    2-D echo 10/14/16  Upper extremity venous duplex 10/09/16  Anti-Infectives:    Azithromycin 5/24/181, resumed 10/12/16---> 10/16/16  Rocephin 10/08/16---> 10/15/16   Subjective:   Patient complains of severe left shoulder pain/neck pain. He refused PT earlier today, but subsequently worked with PT in the afternoon after being encouraged by me to do so. No shortness of breath. No chest pain. No nausea or vomiting.  Objective:    Vitals:   10/16/16 0621 10/16/16 0929 10/16/16 1352 10/16/16 1607  BP: 122/69 (!) 143/67 Marland Kitchen)  119/58   Pulse: 80 84 90   Resp: 18 16    Temp: 98.3 F (36.8  C) 97.5 F (36.4 C)    TempSrc: Oral Oral    SpO2: 99% 99% 95% 99%  Weight:      Height:        Intake/Output Summary (Last 24 hours) at 10/16/16 1650 Last data filed at 10/16/16 1503  Gross per 24 hour  Intake              360 ml  Output              350 ml  Net               10 ml   Filed Weights   10/13/16 2245 10/15/16 0440 10/15/16 2123  Weight: 87.5 kg (192 lb 14.4 oz) 87 kg (191 lb 14.4 oz) 86.6 kg (191 lb)    Exam: General exam: Appears uncomfortable, mildly restless. Respiratory system: Slightly diminished but otherwise clear. Respiratory effort appears normal. Cardiovascular system: Regular rate, and rhythm. No murmurs, rubs, or gallops. Gastrointestinal system: Abdomen is soft, nondistended and nontender. Normal bowel sounds.  Central nervous system: Appears nonfocal. Extremities: No clubbing, cyanosis or edema. Skin: No rashes, lesions, or ulcers. Psychiatry: Judgment and insight appear to be impaired. Mood is irritable.  Data Reviewed:   I have personally reviewed following labs and imaging studies:  Labs: Basic Metabolic Panel:  Recent Labs Lab 10/12/16 1540 10/13/16 0753 10/14/16 0351 10/15/16 0553 10/16/16 0446  NA 136 136 136 137 137  K 5.3* 4.9 4.7 5.3* 5.4*  CL 105 105 104 103 104  CO2 20* 21* '24 25 23  '$ GLUCOSE 125* 102* 111* 91 100*  BUN 61* 59* 54* 51* 57*  CREATININE 2.15* 1.93* 1.65* 1.51* 1.70*  CALCIUM 9.4 9.1 9.1 9.3 9.6   GFR Estimated Creatinine Clearance: 45.6 mL/min (A) (by C-G formula based on SCr of 1.7 mg/dL (H)). Liver Function Tests: No results for input(s): AST, ALT, ALKPHOS, BILITOT, PROT, ALBUMIN in the last 168 hours. No results for input(s): LIPASE, AMYLASE in the last 168 hours. No results for input(s): AMMONIA in the last 168 hours. Coagulation profile No results for input(s): INR, PROTIME in the last 168 hours.  CBC:  Recent Labs Lab 10/13/16 0753 10/14/16 0351  WBC 10.1 12.7*  HGB 9.6* 9.7*  HCT 31.6*  31.7*  MCV 82.3 81.3  PLT 638* 770*   Cardiac Enzymes: No results for input(s): CKTOTAL, CKMB, CKMBINDEX, TROPONINI in the last 168 hours. BNP (last 3 results) No results for input(s): PROBNP in the last 8760 hours. CBG: No results for input(s): GLUCAP in the last 168 hours. D-Dimer:  Recent Labs  10/15/16 1442  DDIMER 0.76*   Hgb A1c: No results for input(s): HGBA1C in the last 72 hours. Lipid Profile: No results for input(s): CHOL, HDL, LDLCALC, TRIG, CHOLHDL, LDLDIRECT in the last 72 hours. Thyroid function studies: No results for input(s): TSH, T4TOTAL, T3FREE, THYROIDAB in the last 72 hours.  Invalid input(s): FREET3 Anemia work up: No results for input(s): VITAMINB12, FOLATE, FERRITIN, TIBC, IRON, RETICCTPCT in the last 72 hours. Sepsis Labs:  Recent Labs Lab 10/10/16 0453 10/13/16 0753 10/14/16 0351  PROCALCITON 0.20  --   --   WBC  --  10.1 12.7*    Microbiology Recent Results (from the past 240 hour(s))  Urine culture     Status: Abnormal   Collection Time: 10/08/16  4:19 AM  Result Value Ref Range  Status   Specimen Description URINE, RANDOM  Final   Special Requests NONE  Final   Culture MULTIPLE SPECIES PRESENT, SUGGEST RECOLLECTION (A)  Final   Report Status 10/09/2016 FINAL  Final  Blood Culture (routine x 2)     Status: None   Collection Time: 10/08/16  4:35 AM  Result Value Ref Range Status   Specimen Description BLOOD RIGHT ARM  Final   Special Requests   Final    BOTTLES DRAWN AEROBIC AND ANAEROBIC Blood Culture adequate volume   Culture NO GROWTH 5 DAYS  Final   Report Status 10/13/2016 FINAL  Final  Blood Culture (routine x 2)     Status: None   Collection Time: 10/08/16  4:36 AM  Result Value Ref Range Status   Specimen Description BLOOD RIGHT HAND  Final   Special Requests   Final    IN PEDIATRIC BOTTLE Blood Culture results may not be optimal due to an excessive volume of blood received in culture bottles   Culture NO GROWTH 5 DAYS   Final   Report Status 10/13/2016 FINAL  Final  MRSA PCR Screening     Status: Abnormal   Collection Time: 10/13/16  2:44 PM  Result Value Ref Range Status   MRSA by PCR POSITIVE (A) NEGATIVE Final    Comment:        The GeneXpert MRSA Assay (FDA approved for NASAL specimens only), is one component of a comprehensive MRSA colonization surveillance program. It is not intended to diagnose MRSA infection nor to guide or monitor treatment for MRSA infections. RESULT CALLED TO, READ BACK BY AND VERIFIED WITH: Maple Hudson RN 17:30 10/13/16 ([email or web address])     Radiology: Dg Chest 2 View  Result Date: 10/15/2016 CLINICAL DATA:  Follow-up pneumonia. Current history of CHF, stage 3 chronic kidney disease, myeloid metaplasia/myeloproliferative disease. EXAM: CHEST  2 VIEW COMPARISON:  10/12/2016, 10/08/2016 and earlier including CT chest 04/01/2016. FINDINGS: Cardiac silhouette markedly enlarged, unchanged. Pulmonary venous hypertension without evidence of pulmonary edema currently. Improved aeration in the lower lobes since the examination 3 days ago with only mild right lower lobe opacity persisting. No new pulmonary parenchymal abnormalities. Diffuse osteosclerosis as noted previously. IMPRESSION: Improved bilateral pneumonia since the examination 3 days ago with only minimal right lower lobe opacity persisting. No new abnormalities. Stable cardiomegaly without pulmonary edema currently. Electronically Signed   By: Evangeline Dakin M.D.   On: 10/15/2016 20:04   Nm Pulmonary Perf And Vent  Result Date: 10/15/2016 CLINICAL DATA:  Short of breath. Concern pulmonary embolism. Pulmonary hypertension. EXAM: NUCLEAR MEDICINE VENTILATION - PERFUSION LUNG SCAN TECHNIQUE: Ventilation images were obtained in multiple projections using inhaled aerosol Tc-36mDTPA. Perfusion images were obtained in multiple projections after intravenous injection of Tc-956mAA. LAO/RPO Oblique projections not performed. Patient  became nauseous and vomited. RADIOPHARMACEUTICALS:  30.0 mCi Technetium-9948mPA aerosol inhalation and 4.0 mCi Technetium-78m45m IV COMPARISON:  Chest radiograph 10/15/2016 FINDINGS: Ventilation: No focal ventilation defect. Elevation RIGHT hemidiaphragm Perfusion: No wedge shaped peripheral perfusion defects to suggest acute pulmonary embolism. IMPRESSION: No evidence acute or chronic pulmonary embolism. Electronically Signed   By: StewSuzy Bouchard.   On: 10/15/2016 17:29    Medications:   . aspirin EC  81 mg Oral Daily  . azithromycin  500 mg Oral Daily  . bupivacaine  5 mL Infiltration Once  . carvedilol  3.125 mg Oral BID WC  . Chlorhexidine Gluconate Cloth  6 each Topical Q0600  . enoxaparin (LOVENOX) injection  40 mg Subcutaneous Q24H  . hydrALAZINE  25 mg Oral Q8H  . isosorbide dinitrate  30 mg Oral BID  . lidocaine   Topical TID  . lidocaine  1 mL Subcutaneous Once  . mouth rinse  15 mL Mouth Rinse BID  . methocarbamol  750 mg Oral TID  . methylPREDNISolone acetate  2 mg Intra-articular Once  . mupirocin ointment  1 application Nasal BID  . polyethylene glycol  17 g Oral Daily   Continuous Infusions:  Medical decision making is moderately complex therefore this is a level 2 visit. (> 3 problem points, >3 data points, moderate risk: Need 2 out of 3)   LOS: 7 days   Juan [name]  [facility] Pager [phone]. If unable to reach me by pager, please call my cell phone at [phone].  *Please refer to [email or web address], password [email or web address] to get updated schedule on who will round on this patient, as hospitalists switch teams weekly. If 7PM-7AM, please contact night-coverage at [email or web address], password [email or web address] for any overnight needs.  10/16/2016, 4:50 PM

## 2016-10-16 NOTE — Progress Notes (Signed)
*  PRELIMINARY RESULTS* Vascular Ultrasound Bilateral lower extremity venous duplex has been completed.  Preliminary findings: No evidence of deep vein thrombosis or baker's cysts bilaterally.   Juan [name] 10/16/2016, 2:14 PM

## 2016-10-16 NOTE — Progress Notes (Signed)
Physical Therapy Treatment Patient Details Name: Juan [name] MRN: [id number] DOB: 1940-10-04 Today's Date: 10/16/2016    History of Present Illness Pt is a 76 yo male admitted through ED on 10/08/16 with acute L arm pain which has persisted since being hospitalized which was found to be related to his myeloproliferative disorder. 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     PT Comments    Pt continues to make slow progress with therapy with little motivation and increased fatigue noted this session. Pt is cooperative to perform stand pivot transfer from bed to recliner and requires Mod A to stand and Min A throughout transfer. Due to current functional mobility and need for 24 hr caregiver assistance, pt may benefit from SNF at discharge in order to maximize his functional outcomes.     Follow Up Recommendations  SNF;Supervision/Assistance - 24 hour     Equipment Recommendations  Other (comment) (defer to next venue)    Recommendations for Other Services       Precautions / Restrictions Precautions Precautions: Fall Restrictions Weight Bearing Restrictions: No    Mobility  Bed Mobility Overal bed mobility: Needs Assistance Bed Mobility: Supine to Sit     Supine to sit: Mod assist     General bed mobility comments: Mod A to get EOB with assistance to scoot hips to EOB  Transfers Overall transfer level: Needs assistance Equipment used: Rolling walker (2 wheeled) Transfers: Sit to/from Stand Sit to Stand: Mod assist Stand pivot transfers: Min assist       General transfer comment: Mod A to stand from EOB and Min A throughout transfer  Ambulation/Gait                 Stairs            Wheelchair Mobility    Modified Rankin (Stroke Patients Only)       Balance Overall balance assessment: Needs assistance Sitting-balance support: No upper  extremity supported;Feet supported Sitting balance-Leahy Scale: Fair     Standing balance support: Bilateral upper extremity supported;During functional activity Standing balance-Leahy Scale: Poor Standing balance comment: reliant on RW for stability in standing                            Cognition Arousal/Alertness: Awake/alert Behavior During Therapy: WFL for tasks assessed/performed Overall Cognitive Status: Within Functional Limits for tasks assessed                                        Exercises      General Comments        Pertinent Vitals/Pain Pain Assessment: Faces Faces Pain Scale: Hurts little more Pain Location: left shoulder Pain Descriptors / Indicators: Aching;Grimacing;Guarding Pain Intervention(s): Monitored during session;RN gave pain meds during session    Home Living                      Prior Function            PT Goals (current goals can now be found in the care plan section) Acute Rehab PT Goals Patient Stated Goal: to feel better PT Goal Formulation: With patient Time For Goal Achievement: 10/23/16 Potential to Achieve Goals: Fair Progress towards PT goals: Progressing toward goals  Frequency    Min 2X/week      PT Plan Discharge plan needs to be updated;Frequency needs to be updated    Co-evaluation              AM-PAC PT "6 Clicks" Daily Activity  Outcome Measure  Difficulty turning over in bed (including adjusting bedclothes, sheets and blankets)?: Total Difficulty moving from lying on back to sitting on the side of the bed? : Total Difficulty sitting down on and standing up from a chair with arms (e.g., wheelchair, bedside commode, etc,.)?: A Lot Help needed moving to and from a bed to chair (including a wheelchair)?: A Lot Help needed walking in hospital room?: A Lot Help needed climbing 3-5 steps with a railing? : A Lot 6 Click Score: 10    End of Session Equipment Utilized  During Treatment: Gait belt;Oxygen Activity Tolerance: Patient tolerated treatment well Patient left: in chair;with call [place]/phone within reach;with chair alarm set Nurse Communication: Mobility status PT Visit Diagnosis: Unsteadiness on feet (R26.81);Muscle weakness (generalized) (M62.81);Pain Pain - Right/Left: Left Pain - part of body: Shoulder     Time: 1500-1520 PT Time Calculation (min) (ACUTE ONLY): 20 min  Charges:  $Therapeutic Activity: 8-22 mins                    G Codes:       Scheryl Marten PT, DPT  [phone]     Shanon Rosser 10/16/2016, 3:27 PM

## 2016-10-16 NOTE — Progress Notes (Signed)
Patient is currently NPO. No procedures are currently scheduled. Rama, MD notified. MD stated to call cardiology. Orders followed. Cardiology paged. Will continue to monitor.

## 2016-10-17 DIAGNOSIS — E1142 Type 2 diabetes mellitus with diabetic polyneuropathy: Secondary | ICD-10-CM

## 2016-10-17 DIAGNOSIS — Z794 Long term (current) use of insulin: Secondary | ICD-10-CM

## 2016-10-17 DIAGNOSIS — N390 Urinary tract infection, site not specified: Secondary | ICD-10-CM

## 2016-10-17 DIAGNOSIS — R0902 Hypoxemia: Secondary | ICD-10-CM

## 2016-10-17 LAB — BASIC METABOLIC PANEL
Anion gap: 10 (ref 5–15)
BUN: 57 mg/dL — ABNORMAL HIGH (ref 6–20)
CO2: 23 mmol/L (ref 22–32)
Calcium: 9.4 mg/dL (ref 8.9–10.3)
Chloride: 105 mmol/L (ref 101–111)
Creatinine, Ser: 1.69 mg/dL — ABNORMAL HIGH (ref 0.61–1.24)
GFR calc Af Amer: 44 mL/min — ABNORMAL LOW (ref >60)
GFR, EST NON AFRICAN AMERICAN: 38 mL/min — AB (ref >60)
Glucose, Bld: 144 mg/dL — ABNORMAL HIGH (ref 65–99)
POTASSIUM: 4.7 mmol/L (ref 3.5–5.1)
Sodium: 138 mmol/L (ref 135–145)

## 2016-10-17 LAB — HIV ANTIBODY (ROUTINE TESTING W REFLEX): HIV SCREEN 4TH GENERATION: NONREACTIVE

## 2016-10-17 LAB — RHEUMATOID FACTOR: Rhuematoid fact SerPl-aCnc: 11.3 IU/mL (ref 0.0–13.9)

## 2016-10-17 MED ORDER — HYDRALAZINE HCL 25 MG PO TABS: 25.0000 mg | ORAL_TABLET | Freq: Three times a day (TID) | ORAL | 0 refills | Status: DC

## 2016-10-17 MED ORDER — METHOCARBAMOL 750 MG PO TABS: 750.0000 mg | ORAL_TABLET | Freq: Three times a day (TID) | ORAL | 0 refills | Status: DC

## 2016-10-17 MED ORDER — DOCUSATE SODIUM 100 MG PO CAPS: 100.0000 mg | ORAL_CAPSULE | Freq: Two times a day (BID) | ORAL | 0 refills | Status: DC | PRN

## 2016-10-17 MED ORDER — GABAPENTIN 100 MG PO CAPS: 200.0000 mg | ORAL_CAPSULE | Freq: Three times a day (TID) | ORAL | 0 refills | Status: DC | PRN

## 2016-10-17 MED ORDER — FUROSEMIDE 40 MG PO TABS: 80.0000 mg | ORAL_TABLET | Freq: Every day | ORAL | 3 refills | Status: DC

## 2016-10-17 MED ORDER — LIDOCAINE 5 % EX OINT: TOPICAL_OINTMENT | Freq: Three times a day (TID) | CUTANEOUS | 0 refills | Status: DC

## 2016-10-17 MED ORDER — FUROSEMIDE 80 MG PO TABS
80.0000 mg | ORAL_TABLET | Freq: Every day | ORAL | Status: DC
  Administered 2016-10-17 – 2016-10-18 (×2): 80 mg via ORAL
  Filled 2016-10-17 (×2): qty 1

## 2016-10-17 NOTE — Progress Notes (Signed)
CSW received consult regarding PT recommendation of SNF at discharge.  Patient is refusing SNF. He also does not want home health. He states he lives with his son. He expressed frustration with plan to discharge today. CSW explained that there is certain criteria the physicians have to use for insurance guidelines in order to keep patients in the hospital. CSW confirmed with MD that patient is for discharge today.  CSW signing off.   Percell Locus Rylee Huestis LCSWA [phone]

## 2016-10-17 NOTE — Discharge Summary (Addendum)
Physician Discharge Summary  Juan [name] VEH:[id number] DOB: [birth year] DOA: 10/08/2016  PCP: Colon Branch, MD  Admit date: 10/08/2016 Discharge date: 10/17/2016  Admitted From: Home Discharge disposition: Home, refuses SNF placement.   Recommendations for Outpatient Follow-Up:   1. Home oxygen therapy set up. 2. Follow-up in heart failure clinic in one week.   Discharge Diagnosis:   Principal Problem:   Acute on chronic diastolic CHF (congestive heart failure) (HCC) Active Problems:   CKD (chronic kidney disease) stage 3, GFR 30-59 ml/min   Agnogenic myeloid metaplasia (HCC)   Type II diabetes mellitus (HCC)   CHF (congestive heart failure) (HCC)   Fever   Left arm pain   Acute kidney injury superimposed on CKD (HCC)   Hyperkalemia   Sprain of left rotator cuff capsule   Pulmonary hypertension ([place])   MRSA carrier  Discharge Condition: Improved.  Diet recommendation: Low sodium, heart healthy.  Carbohydrate-modified.    History of Present Illness:   Juan [name] is an 76 y.o. male with a PMH of chronic diastolic CHF, medical noncompliance, stage III CKD (baseline creatinine 1.4), OSA not on CPAP, and myeloproliferative disorder (BCR/ABL, JAK2 negative) who was admitted 10/08/16 for evaluation of left upper extremity pain/left hand pain  Hospital Course by Problem:   Principal Problem:   Acute on chronic diastolic CHF (congestive heart failure) (HCC) With elevated BNP, chest x-ray showing pulmonary edema, and symptomatic CHF (worsening lower extremity edema, orthopnea, PND), the patient was felt to have decompensated CHF. Furosemide was increased to 80 mg twice a day. His dry weight was noted to be around 195 pounds. Discharge weight 194 pounds. Resume outpatient regimen at discharge. Continue Coreg. No spironolactone secondary to hyperkalemia. Will need follow-up in heart failure clinic 1 week post discharge. Home oxygen was set up due to ongoing need of oxygen to maintain  oxygen saturations (oxygen saturation dropped to 86% on room air).  Active Problems:   AKI/CKD (chronic kidney disease) stage 3, GFR 30-59 ml/min/acute urinary retention Creatinine elevated greater than 2 on admission, noted to have urinary retention which resolved with placement of a Foley catheter. Baseline creatinine 1.3-1.6. Current creatinine 1.7.    Agnogenic myeloid metaplasia ([place]) Has seen Dr. Marin Olp in the past (consult note 05/07/16 reviewed). Apparently he has refused a bone marrow biopsy and does not want any treatment for this.    Type II diabetes mellitus ([place]) His last hemoglobin A1c was 5.8% on 07/28/16. Appears to be diet controlled.    Fever due to possible multilobar pneumonia Completed a course of ceftriaxone/azithromycin. Blood cultures negative.    Left arm pain/sprain of left rotator cuff capsule Patient continues to report left shoulder pain. X-ray of the shoulder was negative. Doppler ultrasound of the left upper extremity was negative for DVT. MRI of the cervical spine showed chronic degenerative changes, no spinal cord stenosis. Orthopedics evaluated the patient and recommended an MRI which showed a left rotator cuff tear. Status post steroid injection. Treated with PT/OT and symptomatic treatment with Robaxin, Neurontin, and oxycodone as needed. Local Xylocaine added for ongoing pain, which he continues to report. Refused home PT/OT.    Hyperkalemia Resolved s/p Kayexalate.    MRSA carrier Continue contact precautions. Decontamination therapy ordered.    Pulmonary hypertension (HCC) VQ scan negative for pulmonary embolism. No significant desaturations noted nocturnally. Rheumatologic serologies sent (RF, ANA, ANCA). Outpatient sleep study will be arranged. Cardiology considering right heart catheterization after other workup completed. Follow-up HIV testing negative.  Medical Consultants:    Cardiology  Orthopedic Surgery   Discharge Exam:     Vitals:   10/17/16 0638 10/17/16 0900  BP: 126/76 136/72  Pulse: 81 84  Resp: 16 16  Temp: 97.7 F (36.5 C) 97.8 F (36.6 C)   Vitals:   10/16/16 2222 10/17/16 0500 10/17/16 0638 10/17/16 0900  BP: 131/72  126/76 136/72  Pulse: 89  81 84  Resp: _0 Temp: 98.9 F (37.2 C)  97.7 F (36.5 C) 97.8 F (36.6 C)  TempSrc: Oral  Oral Oral  SpO2: 94%  95% 98%  Weight: 88 kg (193 lb 14.4 oz) 88 kg (194 lb 0.1 oz)    Height:        General exam: Appears calm and comfortable but anxious about being discharged Respiratory system: Diminished but clear. Respiratory effort normal. Cardiovascular system: Regular rate, and rhythm. No murmurs, rubs, or gallops.. Gastrointestinal system: Soft, nontender, nondistended with normal active bowel sounds.. Central nervous system: Nonfocal with no neurological deficits. Alert. Extremities: No clubbing, cyanosis, or edema. Skin: Warm and dry, no rashes or other lesions. Psychiatry: Judgment and insight are impaired. Mood remains somewhat irritable/anxious.    The results of significant diagnostics from this hospitalization (including imaging, microbiology, ancillary and laboratory) are listed below for reference.     Procedures and Diagnostic Studies:   Dg Chest 2 View  Result Date: 10/08/2016 CLINICAL DATA:  Fever, cough, shortness of breath, numbness down the left arm. EXAM: CHEST  2 VIEW COMPARISON:  05/05/2016 FINDINGS: Shallow inspiration with atelectasis in the lung bases. Cardiac enlargement with mild pulmonary vascular congestion. Hazy perihilar opacities may represent mild pulmonary edema. No pleural effusions. No pneumothorax. Mild thoracic scoliosis convex towards the right. IMPRESSION: Cardiac enlargement with mild pulmonary vascular congestion and probable mild perihilar edema. Shallow inspiration with atelectasis in the lung bases. Electronically Signed   By: Lucienne Capers M.D.   On: 10/08/2016 04:56   Mr Cervical Spine Wo  Contrast  Result Date: 10/09/2016 CLINICAL DATA:  Myeloproliferative disorder with left arm pain. EXAM: MRI CERVICAL SPINE WITHOUT CONTRAST TECHNIQUE: Multiplanar, multisequence MR imaging of the cervical spine was performed. No intravenous contrast was administered. COMPARISON:  1. Bone scan 04/17/2016 2. Chest CT 04/01/2016 FINDINGS: Alignment: Normal Vertebrae: There is low T1 weighted signal throughout the bone marrow with heterogeneous areas of high T2 weighted signal. No discrete focal marrow lesion. Cord: Normal Posterior Fossa, vertebral arteries, paraspinal tissues: Visualized posterior fossa is normal. Vertebral artery flow voids are preserved. Normal visualized paraspinal soft tissues. Disc levels: C1-C2: Normal. C2-C3: Disc desiccation without significant bulge.  No stenosis. C3-C4: Disc space narrowing with mild uncovertebral hypertrophy. No stenosis. C4-C5: Mild bilateral uncovertebral and facet hypertrophy without associated stenosis. C5-C6: Disc desiccation with small bulge narrowing the ventral thecal sac. Mild bilateral uncovertebral hypertrophy. No stenosis. C6-C7: Disc desiccation with narrowing of the disc space and type 2 Modic degenerative endplate changes. C7-T1: Normal disc space and facets. No spinal canal or neuroforaminal stenosis. Within the medial aspect of the left T2 transverse process, there is a hyperintense T2 weighted signal lesion measuring 8 mm, seen on coronal image 5. IMPRESSION: 1. 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 No specific finding to explain the left arm pain. 2. Diffuse heterogeneous bone marrow signal which is compatible with the patient's known myeloproliferative disorder. 3. Focal T2 hyperintense lesion in the left transverse process of T2, seen only on the  coronal sequence and thus incompletely evaluated, but unchanged compared to the chest CT of 04/01/2016. Electronically Signed    By: Ulyses Jarred M.D.   On: 10/09/2016 14:00   Dg Shoulder Left  Result Date: 10/09/2016 CLINICAL DATA:  Left neck pain.  Arm pain.  Left hand swelling . EXAM: LEFT SHOULDER - 2+ VIEW COMPARISON:  Chest x-ray 10/08/2016 FINDINGS: No acute bony or joint abnormality identified. No evidence of fracture or dislocation. IMPRESSION: No acute or focal abnormality. Electronically Signed   By: Marcello Moores  Register   On: 10/09/2016 13:42     Labs:   Basic Metabolic Panel:  Recent Labs Lab 10/13/16 0753 10/14/16 0351 10/15/16 0553 10/16/16 0446 10/17/16 1001  NA 136 136 137 137 138  K 4.9 4.7 5.3* 5.4* 4.7  CL 105 104 103 104 105  CO2 21* _0 GLUCOSE 102* 111* 91 100* 144*  BUN 59* 54* 51* 57* 57*  CREATININE 1.93* 1.65* 1.51* 1.70* 1.69*  CALCIUM 9.1 9.1 9.3 9.6 9.4   GFR Estimated Creatinine Clearance: 45.8 mL/min (A) (by C-G formula based on SCr of 1.69 mg/dL (H)).  CBC:  Recent Labs Lab 10/13/16 0753 10/14/16 0351  WBC 10.1 12.7*  HGB 9.6* 9.7*  HCT 31.6* 31.7*  MCV 82.3 81.3  PLT 638* 770*   D-Dimer  Recent Labs  10/15/16 1442  DDIMER 0.76*   Microbiology Recent Results (from the past 240 hour(s))  Urine culture     Status: Abnormal   Collection Time: 10/08/16  4:19 AM  Result Value Ref Range Status   Specimen Description URINE, RANDOM  Final   Special Requests NONE  Final   Culture MULTIPLE SPECIES PRESENT, SUGGEST RECOLLECTION (A)  Final   Report Status 10/09/2016 FINAL  Final  Blood Culture (routine x 2)     Status: None   Collection Time: 10/08/16  4:35 AM  Result Value Ref Range Status   Specimen Description BLOOD RIGHT ARM  Final   Special Requests   Final    BOTTLES DRAWN AEROBIC AND ANAEROBIC Blood Culture adequate volume   Culture NO GROWTH 5 DAYS  Final   Report Status 10/13/2016 FINAL  Final  Blood Culture (routine x 2)     Status: None   Collection Time: 10/08/16  4:36 AM  Result Value Ref Range Status   Specimen Description BLOOD  RIGHT HAND  Final   Special Requests   Final    IN PEDIATRIC BOTTLE Blood Culture results may not be optimal due to an excessive volume of blood received in culture bottles   Culture NO GROWTH 5 DAYS  Final   Report Status 10/13/2016 FINAL  Final  MRSA PCR Screening     Status: Abnormal   Collection Time: 10/13/16  2:44 PM  Result Value Ref Range Status   MRSA by PCR POSITIVE (A) NEGATIVE Final    Comment:        The GeneXpert MRSA Assay (FDA approved for NASAL specimens only), is one component of a comprehensive MRSA colonization surveillance program. It is not intended to diagnose MRSA infection nor to guide or monitor treatment for MRSA infections. RESULT CALLED TO, READ BACK BY AND VERIFIED WITH: R. Greenawalt RN 17:30 10/13/16 ([email or web address])      Discharge Instructions:   Discharge Instructions    (HEART FAILURE PATIENTS) Call MD:  Anytime you have any of the following symptoms: 1) 3 pound weight gain in 24 hours or 5 pounds in 1 week 2) shortness  of breath, with or without a dry hacking cough 3) swelling in the hands, feet or stomach 4) if you have to sleep on extra pillows at night in order to breathe.    Complete by:  As directed    Call MD for:  severe uncontrolled pain    Complete by:  As directed    Diet - low sodium heart healthy    Complete by:  As directed    Diet Carb Modified    Complete by:  As directed    Discharge instructions    Complete by:  As directed    Your medicines have changed.  Please note the changes on your discharge instructions.  Do not take Potassium.  Your dose of hydralizine has been decreased and your dose of neurontin has been increased.  New medicines include Robaxin and topical lidocaine to help with your shoulder pain.   For home use only DME oxygen    Complete by:  As directed    Liters per Minute:  2   Frequency:  Continuous (stationary and portable oxygen unit needed)   Oxygen conserving device:  Yes   Oxygen delivery system:  Gas    Increase activity slowly    Complete by:  As directed      Allergies as of 10/17/2016   No Known Allergies     Medication List    STOP taking these medications   potassium chloride SA 20 MEQ tablet Commonly known as:  K-DUR,KLOR-CON     TAKE these medications   acetaminophen 500 MG tablet Commonly known as:  TYLENOL Take 500-1,000 mg by mouth every 6 (six) hours as needed.   aspirin 325 MG tablet Take 325 mg by mouth daily.   bisacodyl 10 MG suppository Commonly known as:  DULCOLAX Place 1 suppository (10 mg total) rectally daily as needed for moderate constipation.   carvedilol 3.125 MG tablet Commonly known as:  COREG Take 1 tablet (3.125 mg total) by mouth 2 (two) times daily with a meal.   dextromethorphan 30 MG/5ML liquid Commonly known as:  DELSYM Take 5 mLs (30 mg total) by mouth at bedtime as needed for cough.   diclofenac sodium 1 % Gel Commonly known as:  VOLTAREN Apply 4 g topically 2 (two) times daily as needed (pain). Apply to bilateral  knees   docusate sodium 100 MG capsule Commonly known as:  COLACE Take 1 capsule (100 mg total) by mouth 2 (two) times daily as needed for mild constipation.   feeding supplement (GLUCERNA SHAKE) Liqd Take 118.5 mLs by mouth daily. Reported on 08/21/2015   FERROUS GLUCONATE PO Take 240 mg by mouth daily at 10 pm. 1 tablet   fluticasone 50 MCG/ACT nasal spray Commonly known as:  FLONASE Place 2 sprays into both nostrils daily. Reported on 05/02/2015   furosemide 40 MG tablet Commonly known as:  LASIX Take 2 tablets (80 mg total) by mouth daily. Dose changed.  Take 80 mg daily now. What changed:  how much to take  when to take this  additional instructions   gabapentin 100 MG capsule Commonly known as:  NEURONTIN Take 2 capsules (200 mg total) by mouth 3 (three) times daily as needed (pain). Reported on 08/22/2015 What changed:  how much to take   guaiFENesin 600 MG 12 hr tablet Commonly known as:   MUCINEX Take 2 tablets (1,200 mg total) by mouth 2 (two) times daily. What changed:  when to take this  reasons to take this  hydrALAZINE 25 MG tablet Commonly known as:  APRESOLINE Take 1 tablet (25 mg total) by mouth every 8 (eight) hours. What changed:  medication strength  how much to take  when to take this   hydrocerin Crea Apply Eucerin cream to BLE Q day after bathing and roughly towel drying to remove loose skin What changed:  how much to take  how to take this  when to take this  reasons to take this  additional instructions   HYDROcodone-acetaminophen 5-325 MG tablet Commonly known as:  NORCO/VICODIN Take 1 tablet by mouth every 8 (eight) hours. routinely What changed:  when to take this  reasons to take this  additional instructions   isosorbide dinitrate 30 MG tablet Commonly known as:  ISORDIL Take 1 tablet (30 mg total) by mouth 2 (two) times daily.   lidocaine 5 % ointment Commonly known as:  XYLOCAINE Apply topically 3 (three) times daily.   methocarbamol 750 MG tablet Commonly known as:  ROBAXIN Take 1 tablet (750 mg total) by mouth 3 (three) times daily.   metolazone 2.5 MG tablet Commonly known as:  ZAROXOLYN Take 1 tablet (2.5 mg total) by mouth as directed. What changed:  when to take this  additional instructions   simethicone 80 MG chewable tablet Commonly known as:  MYLICON Chew 80 mg by mouth every 6 (six) hours as needed for flatulence.   VITAMIN B COMPLEX PO Take 100 mg by mouth daily.            Durable Medical Equipment        Start     Ordered   10/17/16 0000  For home use only DME oxygen    Question Answer Comment  Liters per Minute 2   Frequency Continuous (stationary and portable oxygen unit needed)   Oxygen conserving device Yes   Oxygen delivery system Gas      10/17/16 1229     Follow-up Information    Nicholes Stairs, MD. Schedule an appointment as soon as possible for a visit in  3 weeks.   Specialty:  Orthopedic Surgery Why:  Pleasr contact office on Monday 10/19/2016 to schedule a follow up appointment. Thank you.  Contact information: [address] [place]  [id number] [phone]        Larey Dresser, MD On 10/23/2016.   Specialty:  Cardiology Why:  at [facility] information: [address] [place] [address] [phone]            Time coordinating discharge: 35 minutes.  Signed:  RAMA,CHRISTINA  Pager [phone] [facility] 10/17/2016, 2:21 PM

## 2016-10-17 NOTE — Progress Notes (Signed)
Pt is on 2LNC and O2 sats are 95 to 96. Pt was tried without O2 for 1 hour and pulse ox dropped as low as 86%. Placed pt back on [facility] and pts oxygen saturation is now back to 95%

## 2016-10-17 NOTE — Progress Notes (Signed)
SATURATION QUALIFICATIONS: (This note is used to comply with regulatory documentation for home oxygen)  Patient Saturations on Room Air at Rest = 86%  Patient Saturations on Room Air while Ambulating = N/A%  Patient Saturations on N/A Liters of oxygen while Ambulating = NA%  Please briefly explain why patient needs home oxygen:  Pt needs home O2 because he desats from 95 on 2LNC at rest in the bed to 86% at [place] at resrt without O2

## 2016-10-17 NOTE — Progress Notes (Signed)
Spoke with patient, he states home oxygen is in the process of being set up at home. His son [name] is there to accept delivery.

## 2016-10-17 NOTE — Progress Notes (Addendum)
CM received callback from [facility] DME rep, Juan [name] stating home oxygen setup will be complete and it is safe to call [facility] at 19:30 this evening.  Juan [name], the pt's son is at the apartment waiting for [facility] to set up the oxygen.  Juan [name] does not have access to a phone for hospital staff to confirm home oxygen setup. [facility] representatives will not be available to confirm the home O2 set up  after 17:00. CM will confirm O2 setup during business hours 10/18/16 as no representatives are available to confirm now.

## 2016-10-18 NOTE — Progress Notes (Signed)
Juan [name] to be D/C'd Home per MD order.  Discussed prescriptions and follow up appointments with the patient. Prescriptions given to patient, medication list explained in detail. Pt verbalized understanding.  Allergies as of 10/18/2016   No Known Allergies     Medication List    STOP taking these medications   potassium chloride SA 20 MEQ tablet Commonly known as:  K-DUR,KLOR-CON     TAKE these medications   acetaminophen 500 MG tablet Commonly known as:  TYLENOL Take 500-1,000 mg by mouth every 6 (six) hours as needed.   aspirin 325 MG tablet Take 325 mg by mouth daily.   bisacodyl 10 MG suppository Commonly known as:  DULCOLAX Place 1 suppository (10 mg total) rectally daily as needed for moderate constipation.   carvedilol 3.125 MG tablet Commonly known as:  COREG Take 1 tablet (3.125 mg total) by mouth 2 (two) times daily with a meal.   dextromethorphan 30 MG/5ML liquid Commonly known as:  DELSYM Take 5 mLs (30 mg total) by mouth at bedtime as needed for cough.   diclofenac sodium 1 % Gel Commonly known as:  VOLTAREN Apply 4 g topically 2 (two) times daily as needed (pain). Apply to bilateral  knees   docusate sodium 100 MG capsule Commonly known as:  COLACE Take 1 capsule (100 mg total) by mouth 2 (two) times daily as needed for mild constipation.   feeding supplement (GLUCERNA SHAKE) Liqd Take 118.5 mLs by mouth daily. Reported on 08/21/2015   FERROUS GLUCONATE PO Take 240 mg by mouth daily at 10 pm. 1 tablet   fluticasone 50 MCG/ACT nasal spray Commonly known as:  FLONASE Place 2 sprays into both nostrils daily. Reported on 05/02/2015   furosemide 40 MG tablet Commonly known as:  LASIX Take 2 tablets (80 mg total) by mouth daily. Dose changed.  Take 80 mg daily now. What changed:  how much to take  when to take this  additional instructions   gabapentin 100 MG capsule Commonly known as:  NEURONTIN Take 2 capsules (200 mg total) by mouth 3 (three)  times daily as needed (pain). Reported on 08/22/2015 What changed:  how much to take   guaiFENesin 600 MG 12 hr tablet Commonly known as:  MUCINEX Take 2 tablets (1,200 mg total) by mouth 2 (two) times daily. What changed:  when to take this  reasons to take this   hydrALAZINE 25 MG tablet Commonly known as:  APRESOLINE Take 1 tablet (25 mg total) by mouth every 8 (eight) hours. What changed:  medication strength  how much to take  when to take this   hydrocerin Crea Apply Eucerin cream to BLE Q day after bathing and roughly towel drying to remove loose skin What changed:  how much to take  how to take this  when to take this  reasons to take this  additional instructions   HYDROcodone-acetaminophen 5-325 MG tablet Commonly known as:  NORCO/VICODIN Take 1 tablet by mouth every 8 (eight) hours. routinely What changed:  when to take this  reasons to take this  additional instructions   isosorbide dinitrate 30 MG tablet Commonly known as:  ISORDIL Take 1 tablet (30 mg total) by mouth 2 (two) times daily.   lidocaine 5 % ointment Commonly known as:  XYLOCAINE Apply topically 3 (three) times daily.   methocarbamol 750 MG tablet Commonly known as:  ROBAXIN Take 1 tablet (750 mg total) by mouth 3 (three) times daily.   metolazone 2.5 MG  tablet Commonly known as:  ZAROXOLYN Take 1 tablet (2.5 mg total) by mouth as directed. What changed:  when to take this  additional instructions   simethicone 80 MG chewable tablet Commonly known as:  MYLICON Chew 80 mg by mouth every 6 (six) hours as needed for flatulence.   VITAMIN B COMPLEX PO Take 100 mg by mouth daily.            Durable Medical Equipment        Start     Ordered   10/17/16 0000  For home use only DME oxygen    Question Answer Comment  Liters per Minute 2   Frequency Continuous (stationary and portable oxygen unit needed)   Oxygen conserving device Yes   Oxygen delivery system Gas       10/17/16 1229      Vitals:   10/17/16 2116 10/18/16 0521  BP: 110/66 124/73  Pulse: 79 74  Resp: 14 16  Temp: 98.4 F (36.9 C) 97.7 F (36.5 C)    Skin clean, dry and intact without evidence of skin break down, no evidence of skin tears noted. IV catheter discontinued intact. Site without signs and symptoms of complications. Dressing and pressure applied. Pt denies pain at this time. No complaints noted.  An After Visit Summary was printed and given to the patient. Patient escorted via [place], and D/C home via [facility] [place], RN [facility] [address] Phone [phone]

## 2016-10-18 NOTE — Progress Notes (Signed)
[facility] rep, Jermaine confirms O2 setup at home.  [name] (son) to accept pt from [place] transport home.  CM has called [facility] for transportation home.  NO other CM needs were communicated.

## 2016-10-19 ENCOUNTER — Telehealth: Payer: Self-pay | Admitting: *Deleted

## 2016-10-19 LAB — ANTINUCLEAR ANTIBODIES, IFA: ANA Ab, IFA: NEGATIVE

## 2016-10-19 MED FILL — hydrALAZINE HCL 25 MG TABS: 25 | 30 days supply | Qty: 90 | Fill #0

## 2016-10-19 MED FILL — METHOCARBAMOL 750 MG TABLET: 750 | 10 days supply | Qty: 30 | Fill #0

## 2016-10-19 MED FILL — GABAPENTIN 100 MG CAPSULE: 100 | 10 days supply | Qty: 60 | Fill #0

## 2016-10-19 NOTE — Telephone Encounter (Signed)
Attempted to contact patient for TCM call, patient initially stated he's "been feeling pretty good" since hospital discharge, but needs help knowing how to use his oxygen. He states home health is supposed to be coming at 4pm today. He then stated "I can't talk to you anymore right now because I don't feel good" and declined to complete TCM call. I attempted to review TCM info and verify medication changes, and patient continued to state he couldn't talk to me right now. He stated I could call him back later this week, perhaps on Thursday, and maybe he would be able to talk with me then. He also reports he did not receive any discharge paperwork/instructions from the hospital; however, nursing notes state that AVS was given to and reviewed with patient.   I will attempt to contact patient again for TCM at another time.

## 2016-10-19 NOTE — Telephone Encounter (Signed)
Thank you :)

## 2016-10-21 ENCOUNTER — Telehealth: Payer: Self-pay | Admitting: *Deleted

## 2016-10-21 ENCOUNTER — Other Ambulatory Visit (HOSPITAL_COMMUNITY): Payer: Self-pay

## 2016-10-21 NOTE — Progress Notes (Signed)
Pt contacted the clinic earlier in the day needing help with his 02 and swapping it from the home unit to the portable tank, I went over there and showed him and the son how to change it over. I advised them to contact me in the morning if they come across any issues before leaving for the appointment.   10/20/2016  Marylouise Stacks, EMT-Paramedic

## 2016-10-21 NOTE — Telephone Encounter (Signed)
Received Certificate of Medical Necessity - CMA 484 - Oxygen from [place], forwarded to provider/[facility] 06/06

## 2016-10-22 NOTE — Telephone Encounter (Signed)
Form completed and faxed to [facility] at [phone]. Forms sent for scanning.

## 2016-10-23 ENCOUNTER — Ambulatory Visit (HOSPITAL_COMMUNITY)
Admission: RE | Admit: 2016-10-23 | Discharge: 2016-10-23 | Disposition: A | Discharge status: Home / Self Care | Payer: Medicare Other | Source: Ambulatory Visit | Attending: Cardiology | Admitting: Cardiology

## 2016-10-23 VITALS — BP 140/90 | HR 72 | Wt 209.0 lb

## 2016-10-23 DIAGNOSIS — Z7982 Long term (current) use of aspirin: Secondary | ICD-10-CM | POA: Insufficient documentation

## 2016-10-23 DIAGNOSIS — Z818 Family history of other mental and behavioral disorders: Secondary | ICD-10-CM | POA: Diagnosis not present

## 2016-10-23 DIAGNOSIS — G4733 Obstructive sleep apnea (adult) (pediatric): Secondary | ICD-10-CM | POA: Insufficient documentation

## 2016-10-23 DIAGNOSIS — I10 Essential (primary) hypertension: Secondary | ICD-10-CM

## 2016-10-23 DIAGNOSIS — E1122 Type 2 diabetes mellitus with diabetic chronic kidney disease: Secondary | ICD-10-CM | POA: Insufficient documentation

## 2016-10-23 DIAGNOSIS — Z87891 Personal history of nicotine dependence: Secondary | ICD-10-CM | POA: Diagnosis not present

## 2016-10-23 DIAGNOSIS — R0602 Shortness of breath: Secondary | ICD-10-CM | POA: Diagnosis not present

## 2016-10-23 DIAGNOSIS — N183 Chronic kidney disease, stage 3 unspecified: Secondary | ICD-10-CM

## 2016-10-23 DIAGNOSIS — C946 Myelodysplastic disease, not classified: Secondary | ICD-10-CM | POA: Diagnosis not present

## 2016-10-23 DIAGNOSIS — Z79899 Other long term (current) drug therapy: Secondary | ICD-10-CM | POA: Diagnosis not present

## 2016-10-23 DIAGNOSIS — R531 Weakness: Secondary | ICD-10-CM

## 2016-10-23 DIAGNOSIS — I712 Thoracic aortic aneurysm, without rupture: Secondary | ICD-10-CM | POA: Diagnosis not present

## 2016-10-23 DIAGNOSIS — D471 Chronic myeloproliferative disease: Secondary | ICD-10-CM | POA: Diagnosis not present

## 2016-10-23 DIAGNOSIS — I5042 Chronic combined systolic (congestive) and diastolic (congestive) heart failure: Secondary | ICD-10-CM | POA: Diagnosis not present

## 2016-10-23 DIAGNOSIS — I2510 Atherosclerotic heart disease of native coronary artery without angina pectoris: Secondary | ICD-10-CM | POA: Insufficient documentation

## 2016-10-23 DIAGNOSIS — I5032 Chronic diastolic (congestive) heart failure: Secondary | ICD-10-CM | POA: Diagnosis not present

## 2016-10-23 DIAGNOSIS — I130 Hypertensive heart and chronic kidney disease with heart failure and stage 1 through stage 4 chronic kidney disease, or unspecified chronic kidney disease: Secondary | ICD-10-CM | POA: Insufficient documentation

## 2016-10-23 LAB — BASIC METABOLIC PANEL
ANION GAP: 7 (ref 5–15)
BUN: 61 mg/dL — ABNORMAL HIGH (ref 6–20)
CALCIUM: 9.6 mg/dL (ref 8.9–10.3)
CO2: 24 mmol/L (ref 22–32)
CREATININE: 1.26 mg/dL — AB (ref 0.61–1.24)
Chloride: 108 mmol/L (ref 101–111)
GFR, EST NON AFRICAN AMERICAN: 54 mL/min — AB (ref >60)
GLUCOSE: 108 mg/dL — AB (ref 65–99)
Potassium: 4.6 mmol/L (ref 3.5–5.1)
Sodium: 139 mmol/L (ref 135–145)

## 2016-10-23 MED ORDER — METOLAZONE 2.5 MG PO TABS: 2.5000 mg | ORAL_TABLET | ORAL | 0 refills | Status: DC

## 2016-10-23 MED ORDER — TORSEMIDE 20 MG PO TABS: 80.0000 mg | ORAL_TABLET | Freq: Every day | ORAL | 3 refills | Status: DC

## 2016-10-23 MED FILL — TORSEMIDE 20 MG TABLET: 20 | 30 days supply | Qty: 120 | Fill #0

## 2016-10-23 NOTE — Telephone Encounter (Signed)
Patient not available at time of call as he was at an appointment.  He declined a call back at another time due to having people coming over later. He stated he would plan to follow-up w/ PCP as scheduled for HFU appointment on Monday.

## 2016-10-23 NOTE — Progress Notes (Signed)
Advanced Heart Failure Medication Review by a Pharmacist  Does the patient  feel that his/her medications are working for him/her?  yes  Has the patient been experiencing any side effects to the medications prescribed?  no  Does the patient measure his/her own blood pressure or blood glucose at home?  no   Does the patient have any problems obtaining medications due to transportation or finances?   no  Understanding of regimen: good Understanding of indications: good Potential of compliance: good Patient understands to avoid NSAIDs. Patient understands to avoid decongestants.  Issues to address at subsequent visits: none   Pharmacist comments: Mr. Kanouse is a 76 y/o male presenting with aid and his medication bottles. He reports good adherence to his medication regimen. Patient's medication list had isosorbide dinitrate, but patient had medication bottle and was taking isosorbide mononitrate. He presents with no medication-related questions or concerns at this time.   Phillis Knack PharmD Candidate   Time with patient: 10 minutes Preparation and documentation time: 3 minutes Total time: 13 minutes

## 2016-10-23 NOTE — Progress Notes (Signed)
Patient ID: Juan [name], male   DOB: 1940/11/22, 76 y.o.   MRN: [id number]    Advanced Heart Failure Clinic Note   PCP: Dr Juan [name]  Primary HF Cardiologist: Dr Juan [name]  Primary Cardiologist: Dr Juan [name]   HPI: Juan [name] is a 76 y.o. male with history of Diastolic HF, OSA, HTN, DM2, and myeloproliferative disorder followed by Oncology.   He was seen by onc 10/22/14 for his myeloproliferative disorder. 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   Admitted to [facility] 01/2015 with increased dyspnea and volume overload. Diuresed with lasix and transitioned to lasix 40 mg three times a day.   Admitted twice in  December 2017 with volume overload.  Diuresed with IV lasix the transitioned to lasix 40 mg twice a day. Discharge weight 198 pounds. Discharged to SNF.    Admitted 10/08/16-10/17/16 with volume overload. PA pressure on Echo was 85 mmHg. This was a newly high PA pressure for him. VQ scan negative for PE. ANA, RA and HIV negative. He was diuresed with IV lasix about 15 pounds. Discharge weight was 194 pounds. It was recommended that he go to SNF, but he preferred to go home with home health. He also was started on home 02 at 2L.   He returns today for HF follow up. Weight up to 209 pounds. He is highly anxious today, very fixated on the fact that he has to wear oxygen long term. He has been set up with home O2 and says that he does not know how to use the extension tubing so that he can walk freely about his home. He sits in one room, uses a portable toilet in the room. He is taking his medications, eating high sodium foods, drinking more than 2L a day. He is SOB with walking, no SOB at rest or with ADL's.     Echo 01/21/15 EF 30%, moderate LVH, mild MR, PA peak pressure 59 mm Hg, Normal RV ECHO 12/2015: Ef 55-60%. Grade IDD Echo 09/2016 EF 55-60%, PA pressure 85 mm Hg.   Labs 01/29/2015: K 3.9 Creatinine  1.59  Labs 02/14/15: K 3.9 Creatinine 1.72  Labs 02/22/2015: K 4.1 Creatinine 1.82  Labs 05/23/2015: K 5.2 Creatinine 1.39  Labs 07/28/2016: K 4.6 Creatinine 1.29   ROS: All systems negative except as listed in HPI, PMH and Problem List.  SH:  Social History   Social History  . Marital status: Widowed    Spouse name: N/A  . Number of children: 2  . Years of education: N/A   Occupational History  . retired    .  Retired   Social History Main Topics  . Smoking status: Former Smoker    Packs/day: 0.25    Years: 12.00    Types: Cigarettes    Quit date: 05/18/1966  . Smokeless tobacco: Never Used     Comment: quit smoking 45 years ago  . Alcohol use No  . Drug use: No  . Sexual activity: Yes   Other Topics Concern  . Not on file   Social History Narrative   Moved from [place] 2006   Widow 2007   Son lives with him     FH:  Family History  Problem Relation Age of Onset  . Schizophrenia Son   . Colon cancer Neg Hx   . Prostate cancer Neg Hx   . Heart attack Neg Hx   .  Diabetes Neg Hx     Past Medical History:  Diagnosis Date  . Allergic rhinitis   . Anemia   . Ascending aortic aneurysm ([place]) 03/2014   4.3cm on CT scan  . CAD (coronary artery disease)    dx elsewheer in past, no documentation. Non-ischemic myoview 2007  . Chronic diastolic CHF (congestive heart failure), NYHA class 2 (HCC)    Normal EF w/ grade 1 dd by echo 12/2015  . Edema    R>L leg, u/s 5-12 neg for DVT  . Hemorrhoid   . History of thrombocytosis   . Hypertension   . Migraine    "once/wk at least" (07/11/2013)  . Myeloproliferative disease ([place])   . Shortness of breath   . Sinus congestion   . Sleep apnea, obstructive    at some point used CPAP, was d/c  years ago  . Type II diabetes mellitus ([place])     Current Outpatient Prescriptions  Medication Sig Dispense Refill  . acetaminophen (TYLENOL) 500 MG tablet Take 500-1,000 mg by mouth every 6 (six) hours as needed.    Marland Kitchen aspirin 325 MG  tablet Take 325 mg by mouth daily.    . B Complex Vitamins (VITAMIN B COMPLEX PO) Take 100 mg by mouth daily.    . bisacodyl (DULCOLAX) 10 MG suppository Place 1 suppository (10 mg total) rectally daily as needed for moderate constipation.    . carvedilol (COREG) 3.125 MG tablet Take 1 tablet (3.125 mg total) by mouth 2 (two) times daily with a meal. 60 tablet 1  . dextromethorphan (DELSYM) 30 MG/5ML liquid Take 5 mLs (30 mg total) by mouth at bedtime as needed for cough. 89 mL 0  . diclofenac sodium (VOLTAREN) 1 % GEL Apply 4 g topically 2 (two) times daily as needed (pain). Apply to bilateral  knees     . docusate sodium (COLACE) 100 MG capsule Take 1 capsule (100 mg total) by mouth 2 (two) times daily as needed for mild constipation. 10 capsule 0  . feeding supplement, GLUCERNA SHAKE, (GLUCERNA SHAKE) LIQD Take 118.5 mLs by mouth daily. Reported on 08/21/2015    . FERROUS GLUCONATE PO Take 240 mg by mouth daily at 10 pm. 1 tablet    . fluticasone (FLONASE) 50 MCG/ACT nasal spray Place 2 sprays into both nostrils daily. Reported on 05/02/2015    . furosemide (LASIX) 40 MG tablet Take 2 tablets (80 mg total) by mouth daily. Dose changed.  Take 80 mg daily now. 90 tablet 3  . gabapentin (NEURONTIN) 100 MG capsule Take 2 capsules (200 mg total) by mouth 3 (three) times daily as needed (pain). Reported on 08/22/2015 60 capsule 0  . guaiFENesin (MUCINEX) 600 MG 12 hr tablet Take 2 tablets (1,200 mg total) by mouth 2 (two) times daily. (Patient taking differently: Take 1,200 mg by mouth 2 (two) times daily as needed for cough or to loosen phlegm. ) 30 tablet 0  . hydrocerin (EUCERIN) CREA Apply Eucerin cream to BLE Q day after bathing and roughly towel drying to remove loose skin (Patient taking differently: Apply 1 application topically daily as needed (dryness). ) 228 g 0  . HYDROcodone-acetaminophen (NORCO/VICODIN) 5-325 MG tablet Take 1 tablet by mouth every 8 (eight) hours. routinely (Patient taking  differently: Take 1 tablet by mouth every 6 (six) hours as needed for moderate pain. routinely ) 20 tablet 0  . isosorbide dinitrate (ISORDIL) 30 MG tablet Take 1 tablet (30 mg total) by mouth 2 (two) times  daily. 60 tablet 1  . lidocaine (XYLOCAINE) 5 % ointment Apply topically 3 (three) times daily. 35.44 g 0  . methocarbamol (ROBAXIN) 750 MG tablet Take 1 tablet (750 mg total) by mouth 3 (three) times daily. 30 tablet 0  . metolazone (ZAROXOLYN) 2.5 MG tablet Take 1 tablet (2.5 mg total) by mouth as directed. (Patient taking differently: Take 2.5 mg by mouth See admin instructions. When instructed by MD to take) 5 tablet 0  . simethicone (MYLICON) 80 MG chewable tablet Chew 80 mg by mouth every 6 (six) hours as needed for flatulence.     No current facility-administered medications for this encounter.     Vitals:   10/23/16 0957  BP: 140/90  Pulse: 72  SpO2: 93%  Weight: 209 lb (94.8 kg)   Wt Readings from Last 3 Encounters:  10/23/16 209 lb (94.8 kg)  10/18/16 194 lb 0.1 oz (88 kg)  10/02/16 200 lb 4 oz (90.8 kg)     PHYSICAL EXAM: General:  Elderly male, NAD. Arrived in wheelchair.  HEENT: Normal.  Neck: supple. 8-9 cm JVD.  Carotids 2+ bilaterally; no bruits. No thyromegaly or lymphadenopathy noted. Cor: PMI normal. Regular rate and rhythm. No murmurs, rubs or gallops.  Lungs: Clear bilaterally. No wheeze, normal effort.  Abdomen: soft, non-tender, non-distended, no HSM. No bruits or masses. + bowel sounds.  Extremities: no cyanosis, clubbing, 2+ bilteral edema to knees.  Neuro: alert & orientedx3, cranial nerves grossly intact. Moves all 4 extremities w/o difficulty. Affect pleasant  ASSESSMENT & PLAN: 1. Chronic combined systolic and diastolic CHF, EF 57% echo 01/21/15, Normal myoview 2007.  EF now 55-60% - NYHA III  - Volume status elevated on exam.  - Stop lasix and start torsemide 80mg  daily.  - Give one dose of metolazone today.  - Check BMET - Take 61mEq of KCl with  metolazone today.  - Continue lisinopril 2.5mg  daily.  - Continue isosorbide 30mg   - No Spiro with history of noncompliance.  2. CKD stage III  - Creatinine baseline 1.7-1.9. - BMET today.  3. HTN - Hypertensive today.  - Will not increase any meds for HTN today, he is going to take metolazone today and too many medication changes in one day often lead to error with him.  4. 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 - He has refused biopsy and further work up many times in the past.   Follow up in 2 weeks. BMET today. [facility], they will follow up on his oxygen situation. Also will contact paramedicine to see if they can go out today and help him.    Arbutus Leas  NP-C   10/23/16

## 2016-10-23 NOTE — Patient Instructions (Signed)
Stop Furosemide  Start Torsemide 80 mg (4 tabs) daily  Take Metolazone 2.5 mg TODAY  Take an extra 1 tab of potassium TODAY  Labs today  Your physician recommends that you schedule a follow-up appointment in: 2 weeks

## 2016-10-26 ENCOUNTER — Ambulatory Visit (INDEPENDENT_AMBULATORY_CARE_PROVIDER_SITE_OTHER): Payer: Medicare Other | Admitting: Internal Medicine

## 2016-10-26 ENCOUNTER — Encounter: Payer: Self-pay | Admitting: Internal Medicine

## 2016-10-26 VITALS — BP 126/72 | HR 75 | Temp 97.7°F | Resp 14 | Ht 75.0 in | Wt 190.0 lb

## 2016-10-26 DIAGNOSIS — I272 Pulmonary hypertension, unspecified: Secondary | ICD-10-CM

## 2016-10-26 DIAGNOSIS — I5023 Acute on chronic systolic (congestive) heart failure: Secondary | ICD-10-CM

## 2016-10-26 NOTE — Patient Instructions (Addendum)
Next visit in 2 or 3 months, please make an appointment   Take your medications as prescribed You are going to run out of metolazone in few days, after you ran out, you don't need to keep taking it.

## 2016-10-26 NOTE — Assessment & Plan Note (Signed)
Acute on chronic CHF: S/p admission, had already a follow-up with cardiology, Lasix was changed to torsemide. Not on spironolactone due to hyperkalemia. Was prescribed metolazone for few days, he has 1 tablet left . After that he is not supposed to go back on it. Dry weight goal: 195 pounds. PNM:  status post antibiotics. Currently asymptomatic Hypoxia: Now on oxygen, etiology of hypoxemia likely multifactorial, will continue O2 therapy for now Pulmonary hypertension, inpatient Negative, cardiology considering sleep study Left shoulder pain: Had extensive evaluation the last admission, including a MRI. Got a local injection, refused PT OT Med compliance: Apparently doing okay with the help of his son. Prediabetes: Last a1c  satisfactory  RTC 2 or 3 months

## 2016-10-26 NOTE — Progress Notes (Signed)
Subjective:    Patient ID: Juan [name], male    DOB: July 19, 1940, 76 y.o.   MRN: [id number]  DOS:  10/26/2016 Type of visit - description : Hospital follow-up,  TCM 14 Interval history: Admitted to the hospital and discharge about a week later on 10/17/2016. DDX was acute on chronic CHF, had pulmonary edema on the chest x-ray and symptomatic CHF. Dry weight felt to be 195 pounds. They recommended to continue previous regimen but no spironolactone due to hyperkalemia.Other problems included pulmonary hypertension, pnm and shoulder pain Labs reviewed: Hemoglobin 9.7, at baseline had a follow-up BMP with cardiology few days ago and creatinine was 1.2, improved  Review of Systems  In general feels well. Appetite is good on and off. No chest pain No nausea, vomiting or diarrhea. No constipation  Past Medical History:  Diagnosis Date  . Allergic rhinitis   . Anemia   . Ascending aortic aneurysm ([place]) 03/2014   4.3cm on CT scan  . CAD (coronary artery disease)    dx elsewheer in past, no documentation. Non-ischemic myoview 2007  . Chronic diastolic CHF (congestive heart failure), NYHA class 2 (HCC)    Normal EF w/ grade 1 dd by echo 12/2015  . Edema    R>L leg, u/s 5-12 neg for DVT  . Hemorrhoid   . History of thrombocytosis   . Hypertension   . Migraine    "once/wk at least" (07/11/2013)  . Myeloproliferative disease ([place])   . Shortness of breath   . Sinus congestion   . Sleep apnea, obstructive    at some point used CPAP, was d/c  years ago  . Type II diabetes mellitus ([place])     Past Surgical History:  Procedure Laterality Date  . TOE SURGERY Right    "tried to straighten out big toe" (07/11/2013)    Social History   Social History  . Marital status: Widowed    Spouse name: N/A  . Number of children: 2  . Years of education: N/A   Occupational History  . retired    .  Retired   Social History Main Topics  . Smoking status: Former Smoker    Packs/day: 0.25   Years: 12.00    Types: Cigarettes    Quit date: 05/18/1966  . Smokeless tobacco: Never Used     Comment: quit smoking 45 years ago  . Alcohol use No  . Drug use: No  . Sexual activity: Yes   Other Topics Concern  . Not on file   Social History Narrative   Moved from [place] 2006   Widow 2007   Son lives with him       Allergies as of 10/26/2016   No Known Allergies     Medication List       Accurate as of 10/26/16  9:43 PM. Always use your most recent med list.          acetaminophen 500 MG tablet Commonly known as:  TYLENOL Take 500-1,000 mg by mouth every 6 (six) hours as needed.   aspirin 325 MG tablet Take 325 mg by mouth daily.   bisacodyl 10 MG suppository Commonly known as:  DULCOLAX Place 1 suppository (10 mg total) rectally daily as needed for moderate constipation.   carvedilol 3.125 MG tablet Commonly known as:  COREG Take 1 tablet (3.125 mg total) by mouth 2 (two) times daily with a meal.   diclofenac sodium 1 % Gel Commonly known as:  VOLTAREN Apply 4 g  topically 2 (two) times daily as needed (pain). Apply to bilateral  knees   docusate sodium 100 MG capsule Commonly known as:  COLACE Take 1 capsule (100 mg total) by mouth 2 (two) times daily as needed for mild constipation.   feeding supplement (GLUCERNA SHAKE) Liqd Take 118.5 mLs by mouth daily. Reported on 08/21/2015   FERROUS GLUCONATE PO Take 240 mg by mouth daily at 10 pm. 1 tablet   fluticasone 50 MCG/ACT nasal spray Commonly known as:  FLONASE Place 2 sprays into both nostrils daily. Reported on 05/02/2015   gabapentin 100 MG capsule Commonly known as:  NEURONTIN Take 2 capsules (200 mg total) by mouth 3 (three) times daily as needed (pain). Reported on 08/22/2015   hydrALAZINE 50 MG tablet Commonly known as:  APRESOLINE Take 50 mg by mouth 3 (three) times daily.   hydrocerin Crea Apply Eucerin cream to BLE Q day after bathing and roughly towel drying to remove loose skin   isosorbide  mononitrate 30 MG 24 hr tablet Commonly known as:  IMDUR Take 30 mg by mouth 2 (two) times daily.   lidocaine 5 % ointment Commonly known as:  XYLOCAINE Apply topically 3 (three) times daily.   methocarbamol 750 MG tablet Commonly known as:  ROBAXIN Take 1 tablet (750 mg total) by mouth 3 (three) times daily.   metolazone 2.5 MG tablet Commonly known as:  ZAROXOLYN Take 1 tablet (2.5 mg total) by mouth as directed.   OXYGEN Inhale 2 L into the lungs continuous.   potassium chloride SA 20 MEQ tablet Commonly known as:  K-DUR,KLOR-CON Take 20 mEq by mouth 2 (two) times daily. Taken with metolazone.   simethicone 80 MG chewable tablet Commonly known as:  MYLICON Chew 80 mg by mouth every 6 (six) hours as needed for flatulence.   torsemide 20 MG tablet Commonly known as:  DEMADEX Take 4 tablets (80 mg total) by mouth daily.   VITAMIN B COMPLEX PO Take 100 mg by mouth daily.          Objective:   Physical Exam BP 126/72 (BP Location: Left Arm, Patient Position: Sitting, Cuff Size: Small)   Pulse 75   Temp 97.7 F (36.5 C) (Oral)   Resp 14   Ht 6\' 3"  (1.905 m)   Wt 190 lb (86.2 kg) Comment: Per Pt  SpO2 97%   BMI 23.75 kg/m  General:   Well developed, slightly under weight appearing, sitting in a wheelchair no distress.  HEENT:  Normocephalic . Face symmetric, atraumatic Lungs:  CTA B Normal respiratory effort, no intercostal retractions, no accessory muscle use. Heart: RRR,  no murmur.  +/+++ pretibial edema bilaterally  Skin: Not pale. Not jaundice Neurologic:  alert & oriented X3.  Speech normal, gait not tested Psych--  No anxious or depressed appearing.      Assessment & Plan:    Assessment Prediabetes  Neuropathy, on Neurontin prn and hydrocodone prn HTN CKD, creatinine ~ 1.7-1.9 Chronic edema and stasis dermatitis Morbid obesity CV: --CHF, EF 30% 01-2015, normal Myoview 2007, refuse it cardiac catheterization --Ascending aortic aneurysm  per CT angio 03-2014, 4.3 cm --CT chest without done 06-2014 --  Refused MRA of the Ao 2016  --pulmonary hypertension: 10-2016: V/Q can neg, [id number]-ANCa (-) Hypoxia: On home oxygen since admission 10/2016 Hem-onc: ---Myeloproliferative disease , refuses treatment --- anemia --- thrombocytosiscytosis  BPH- bladder outlet obstruction Per urology 2017: "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 They recommend continuing Foley catheter, change every 4 weeks " OSA, used a CPAP at some point Poor compliance with advice: Saw psychiatry 04/07/2014 at the hospital and deemed to be competent to make his own decisions.  PLAN: Acute on chronic CHF: S/p admission, had already a follow-up with cardiology, Lasix was changed to torsemide. Not on spironolactone due to hyperkalemia. Was prescribed metolazone for few days, he has 1 tablet left . After that he is not supposed to go back on it. Dry weight goal: 195 pounds. PNM:  status post antibiotics. Currently asymptomatic Hypoxia: Now on oxygen, etiology of hypoxemia likely multifactorial, will continue O2 therapy for now Pulmonary hypertension, inpatient Negative, cardiology considering sleep study Left shoulder pain: Had extensive evaluation the last admission, including a MRI. Got a local injection, refused PT OT Med compliance: Apparently doing okay with the help of his son. Prediabetes: Last a1c  satisfactory  RTC 2 or 3 months

## 2016-10-26 NOTE — Progress Notes (Signed)
Pre visit review using our clinic review tool, if applicable. No additional management support is needed unless otherwise documented below in the visit note. 

## 2016-10-27 ENCOUNTER — Other Ambulatory Visit (HOSPITAL_COMMUNITY): Payer: Self-pay

## 2016-10-27 NOTE — Progress Notes (Signed)
Paramedicine Encounter    Patient ID: Holt Woolbright, male    DOB: 09/11/1940, 76 y.o.   MRN: [id number]    Patient Care Team: Colon Branch, MD as PCP - General Roxana Hires, MD as Consulting Physician (Hematology and Oncology) [name], MD as Consulting Physician (Cardiology) [name], MD as Consulting Physician (Hematology and Oncology) Ardis Hughs, MD as Attending Physician (Urology)  Patient Active Problem List   Diagnosis Date Noted  . 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 Pulmonary hypertension ([place])   . Sprain of left rotator cuff capsule   . Hyperkalemia   . Acute kidney injury superimposed on CKD ([place])   . Acute on chronic diastolic CHF (congestive heart failure) ([place]) 10/08/2016  . Fever 10/08/2016  . Left arm pain 10/08/2016  . CHF (congestive heart failure) ([place]) 05/06/2016  . Paroxysmal SVT (supraventricular tachycardia) ([place]) 04/20/2016  . Type II diabetes mellitus ([place]) 04/20/2016  . Gout 04/20/2016  . Hyperuricemia 04/20/2016  . B12 deficiency 04/20/2016  . 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 Hydronephrosis   . Thrombocytosis ([place]) 04/07/2016  . Anemia 04/07/2016  . Pneumonia 04/01/2016  . Pressure injury of skin 04/01/2016  . Pressure ulcer stage II 12/31/2015  . UTI (urinary tract infection) 12/30/2015  . Sepsis ([place]) 12/29/2015  . UTI (lower urinary tract infection) 12/29/2015  . T wave inversion in EKG 12/29/2015  . PCP NOTES >>>>>>>>>>>>>>>>> 05/02/2015  . Arm pain   . Peripheral edema   . Unable to ambulate   . Thoracic aortic aneurysm ([place]) 04/26/2014  . CKD (chronic kidney disease) stage 3, GFR 30-59 ml/min 04/26/2014  . 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 Chest pain 04/15/2014  . Encounter for palliative care 04/06/2014  . Weakness generalized 04/06/2014  . Venous stasis dermatitis of both lower extremities   . Malnutrition of moderate degree ([place]) 03/31/2014  . Morbid obesity due to excess  calories ([place]) 03/29/2014  . 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 Poor compliance with advise  05/05/2012  . Annual physical exam 01/14/2012  . Weight loss 01/14/2012  . BPH (benign prostatic hyperplasia) 01/14/2012  . *Chronic lower extremity edema, stasis dermatitis 06/09/2011  . CARDIOMEGALY 04/29/2009  . *Prediabetes 09/07/2006  . Symptomatic anemia 09/07/2006  . Myeloproliferative disease ([place]) 09/07/2006  . Obstructive sleep apnea 09/07/2006  . Essential hypertension 09/07/2006  . Coronary atherosclerosis 09/07/2006  . *CHF-- PCP comments 09/07/2006  . Allergic rhinitis 09/07/2006    Current Outpatient Prescriptions:  .  acetaminophen (TYLENOL) 500 MG tablet, Take 500-1,000 mg by mouth every 6 (six) hours as needed., Disp: , Rfl:  .  aspirin 325 MG tablet, Take 325 mg by mouth daily., Disp: , Rfl:  .  B Complex Vitamins (VITAMIN B COMPLEX PO), Take 100 mg by mouth daily., Disp: , Rfl:  .  FERROUS GLUCONATE PO, Take 240 mg by mouth daily at 10 pm. 1 tablet, Disp: , Rfl:  .  fluticasone (FLONASE) 50 MCG/ACT nasal spray, Place 2 sprays into both nostrils daily. Reported on 05/02/2015, Disp: , Rfl:  .  gabapentin (NEURONTIN) 100 MG capsule, Take 2 capsules (200 mg total) by mouth 3 (three) times daily as needed (pain). Reported on 08/22/2015, Disp: 60 capsule, Rfl: 0 .  hydrALAZINE (APRESOLINE) 50 MG tablet, Take 50 mg by mouth 3 (three) times daily., Disp: , Rfl:  .  isosorbide mononitrate (IMDUR) 30 MG 24 hr tablet, Take 30 mg by mouth 2 (two) times daily., Disp: ,  Rfl:  .  methocarbamol (ROBAXIN) 750 MG tablet, Take 1 tablet (750 mg total) by mouth 3 (three) times daily., Disp: 30 tablet, Rfl: 0 .  OXYGEN, Inhale 2 L into the lungs continuous., Disp: , Rfl:  .  potassium chloride SA (K-DUR,KLOR-CON) 20 MEQ tablet, Take 20 mEq by mouth 2 (two) times daily. Taken with metolazone., Disp: , Rfl:  .  torsemide (DEMADEX) 20 MG tablet, Take 4 tablets (80 mg total) by mouth  daily., Disp: 120 tablet, Rfl: 3 .  bisacodyl (DULCOLAX) 10 MG suppository, Place 1 suppository (10 mg total) rectally daily as needed for moderate constipation. (Patient not taking: Reported on 10/26/2016), Disp: , Rfl:  .  carvedilol (COREG) 3.125 MG tablet, Take 1 tablet (3.125 mg total) by mouth 2 (two) times daily with a meal., Disp: 60 tablet, Rfl: 1 .  diclofenac sodium (VOLTAREN) 1 % GEL, Apply 4 g topically 2 (two) times daily as needed (pain). Apply to bilateral  knees , Disp: , Rfl:  .  docusate sodium (COLACE) 100 MG capsule, Take 1 capsule (100 mg total) by mouth 2 (two) times daily as needed for mild constipation. (Patient not taking: Reported on 10/26/2016), Disp: 10 capsule, Rfl: 0 .  feeding supplement, GLUCERNA SHAKE, (GLUCERNA SHAKE) LIQD, Take 118.5 mLs by mouth daily. Reported on 08/21/2015, Disp: , Rfl:  .  hydrocerin (EUCERIN) CREA, Apply Eucerin cream to BLE Q day after bathing and roughly towel drying to remove loose skin (Patient taking differently: Apply 1 application topically daily as needed (dryness). ), Disp: 228 g, Rfl: 0 .  lidocaine (XYLOCAINE) 5 % ointment, Apply topically 3 (three) times daily., Disp: 35.44 g, Rfl: 0 .  metolazone (ZAROXOLYN) 2.5 MG tablet, Take 1 tablet (2.5 mg total) by mouth as directed., Disp: 5 tablet, Rfl: 0 .  simethicone (MYLICON) 80 MG chewable tablet, Chew 80 mg by mouth every 6 (six) hours as needed for flatulence., Disp: , Rfl:  No Known Allergies   Social History   Social History  . Marital status: Widowed    Spouse name: N/A  . Number of children: 2  . Years of education: N/A   Occupational History  . retired    .  Retired   Social History Main Topics  . Smoking status: Former Smoker    Packs/day: 0.25    Years: 12.00    Types: Cigarettes    Quit date: 05/18/1966  . Smokeless tobacco: Never Used     Comment: quit smoking 45 years ago  . Alcohol use No  . Drug use: No  . Sexual activity: Yes   Other Topics Concern  .  Not on file   Social History Narrative   Moved from [place] 2006   Widow 2007   Son lives with him     Physical Exam  Pulmonary/Chest: No respiratory distress. He has no wheezes. He has no rales.  Abdominal: There is no tenderness. There is no rebound and no guarding.  Musculoskeletal: He exhibits edema. He exhibits no tenderness.  Skin: Skin is warm and dry. He is not diaphoretic.        Future Appointments Date Time Provider [place]  11/05/2016 10:30 AM [facility] PA/NP [facility] None  02/11/2017 10:45 AM Colon Branch, MD [facility] None    ATF pt CAO x4 sitting on the edge of his bed complaining that the oxygen in his portable is working correctly.  Pt has been having issues with understanding how the oxygen works.  [facility]  [facility] and they advised me that the regulator that pt has is "conservative regulator" which lets out oxygen out when he inhales.  This was explained to pt but he requests a constant flow regulator, therefore I took the portable oxygen with me. I will take it to [facility] store tomorrow for the new regulator.  Pt denies sob, dizziness and chest pain.  Pt sleeps in a hospital bed in the living room and he has several urinals on his bedside.  He also has a bedside commode. 'Pt doesn't want to use the pill box.  He takes the medications from the bottle and is taking them correctly.  His son lives at home with him and helps care for him. Pt stated that he would like someone to come to his home twice a week to take his vitals including oxygen sat.  His son cooks for him or they go to the grocery store for cooked food.  Pt's vitals noted.  I will revisit with pt tomorrow after I leave [facility]. rx bottles verified  Pt has edema in both lower legs midway to his knees.  He denies pain in his legs and feet.  Pt stated that he doesn't weigh daily because he doesn't feel like it. I asked pt to try to weigh at least 5 days a week and record his weights on  the calendar that I gave him.    Refill needed: iosobride  BP (!) 110/56 (BP Location: Right Arm, Patient Position: Sitting, Cuff Size: Normal)   Pulse 68   Resp 16   Wt 194 lb (88 kg)   SpO2 98%   BMI 24.25 kg/m       Deniesha Stenglein, EMT Paramedic 10/27/2016    ACTION: Home visit completed

## 2016-10-30 ENCOUNTER — Emergency Department (HOSPITAL_COMMUNITY): Payer: Medicare Other

## 2016-10-30 ENCOUNTER — Emergency Department (HOSPITAL_COMMUNITY)
Admission: EM | Admit: 2016-10-30 | Discharge: 2016-10-30 | Disposition: A | Discharge status: Home / Self Care | Payer: Medicare Other | Source: Home / Self Care | Attending: Emergency Medicine | Admitting: Emergency Medicine

## 2016-10-30 ENCOUNTER — Encounter (HOSPITAL_COMMUNITY): Payer: Self-pay | Admitting: Emergency Medicine

## 2016-10-30 DIAGNOSIS — Z87891 Personal history of nicotine dependence: Secondary | ICD-10-CM | POA: Insufficient documentation

## 2016-10-30 DIAGNOSIS — M79642 Pain in left hand: Secondary | ICD-10-CM | POA: Diagnosis not present

## 2016-10-30 DIAGNOSIS — Z7984 Long term (current) use of oral hypoglycemic drugs: Secondary | ICD-10-CM

## 2016-10-30 DIAGNOSIS — I2510 Atherosclerotic heart disease of native coronary artery without angina pectoris: Secondary | ICD-10-CM | POA: Insufficient documentation

## 2016-10-30 DIAGNOSIS — I5042 Chronic combined systolic (congestive) and diastolic (congestive) heart failure: Secondary | ICD-10-CM

## 2016-10-30 DIAGNOSIS — Z7982 Long term (current) use of aspirin: Secondary | ICD-10-CM | POA: Insufficient documentation

## 2016-10-30 DIAGNOSIS — M79602 Pain in left arm: Secondary | ICD-10-CM

## 2016-10-30 DIAGNOSIS — R069 Unspecified abnormalities of breathing: Secondary | ICD-10-CM | POA: Diagnosis not present

## 2016-10-30 DIAGNOSIS — N183 Chronic kidney disease, stage 3 (moderate): Secondary | ICD-10-CM | POA: Insufficient documentation

## 2016-10-30 DIAGNOSIS — J189 Pneumonia, unspecified organism: Secondary | ICD-10-CM | POA: Diagnosis not present

## 2016-10-30 DIAGNOSIS — E1140 Type 2 diabetes mellitus with diabetic neuropathy, unspecified: Secondary | ICD-10-CM | POA: Insufficient documentation

## 2016-10-30 DIAGNOSIS — C946 Myelodysplastic disease, not classified: Secondary | ICD-10-CM | POA: Diagnosis not present

## 2016-10-30 DIAGNOSIS — M79632 Pain in left forearm: Secondary | ICD-10-CM | POA: Diagnosis not present

## 2016-10-30 DIAGNOSIS — D731 Hypersplenism: Secondary | ICD-10-CM | POA: Diagnosis not present

## 2016-10-30 DIAGNOSIS — S4990XA Unspecified injury of shoulder and upper arm, unspecified arm, initial encounter: Secondary | ICD-10-CM | POA: Diagnosis not present

## 2016-10-30 DIAGNOSIS — Z79899 Other long term (current) drug therapy: Secondary | ICD-10-CM

## 2016-10-30 DIAGNOSIS — I110 Hypertensive heart disease with heart failure: Secondary | ICD-10-CM

## 2016-10-30 DIAGNOSIS — Z7409 Other reduced mobility: Secondary | ICD-10-CM | POA: Diagnosis not present

## 2016-10-30 DIAGNOSIS — M79622 Pain in left upper arm: Secondary | ICD-10-CM | POA: Diagnosis not present

## 2016-10-30 DIAGNOSIS — I5032 Chronic diastolic (congestive) heart failure: Secondary | ICD-10-CM | POA: Diagnosis not present

## 2016-10-30 DIAGNOSIS — R918 Other nonspecific abnormal finding of lung field: Secondary | ICD-10-CM | POA: Diagnosis not present

## 2016-10-30 DIAGNOSIS — E1122 Type 2 diabetes mellitus with diabetic chronic kidney disease: Secondary | ICD-10-CM | POA: Diagnosis not present

## 2016-10-30 DIAGNOSIS — I130 Hypertensive heart and chronic kidney disease with heart failure and stage 1 through stage 4 chronic kidney disease, or unspecified chronic kidney disease: Secondary | ICD-10-CM | POA: Diagnosis not present

## 2016-10-30 DIAGNOSIS — M25532 Pain in left wrist: Secondary | ICD-10-CM | POA: Diagnosis not present

## 2016-10-30 DIAGNOSIS — M7989 Other specified soft tissue disorders: Secondary | ICD-10-CM | POA: Diagnosis not present

## 2016-10-30 MED ORDER — HYDROMORPHONE HCL 1 MG/ML IJ SOLN
1.0000 mg | Freq: Once | INTRAMUSCULAR | Status: AC
  Administered 2016-10-30: 1 mg via INTRAVENOUS
  Filled 2016-10-30: qty 1

## 2016-10-30 MED ORDER — ONDANSETRON HCL 4 MG/2ML IJ SOLN
4.0000 mg | Freq: Once | INTRAMUSCULAR | Status: AC
  Administered 2016-10-30: 4 mg via INTRAVENOUS
  Filled 2016-10-30: qty 2

## 2016-10-30 MED ORDER — HYDROCODONE-ACETAMINOPHEN 5-325 MG PO TABS: 1.0000 | ORAL_TABLET | Freq: Four times a day (QID) | ORAL | 0 refills | Status: DC | PRN

## 2016-10-30 MED ORDER — SODIUM CHLORIDE 0.9 % IV SOLN
INTRAVENOUS | Status: DC
  Administered 2016-10-30: 75 mL/h via INTRAVENOUS

## 2016-10-30 NOTE — Care Management (Signed)
ED CM met with patient at bedside to discuss the Transitional Care Program at [facility],  patient's PCP who will assist with barriers to [facility].  Patient verbally declines any assistance with additional services. Patient states, " I don't want to sign any more forms and don't want anyone in my business.  Despite CM explaining  The benefits of the  programs patient continues to decline, states, I don't want anyone at my house. CM made patient aware that if he should change his mind he can contact his PCP, he verbalized understanding and teach back was done. 

## 2016-10-30 NOTE — Progress Notes (Signed)
CSW provided pt with list of transportation resources to assist him with getting to his MD appointments.  Pt wants to d/c back to his house with his son, is not interested in [facility], and expresses no concerns with his ability to care for self.  Pt would like a portable 02 tank and plans on discussing this with his PCP and [facility], his 02 provider.  Pt c/o of pain and requesting additional pain meds in the ED, MD informed.  Pt also expressed concerns re: not being able to pay for his meds and RNCM was consulted.

## 2016-10-30 NOTE — ED Notes (Signed)
Regular tray ordered for patient with diet coke and orange juice.

## 2016-10-30 NOTE — ED Notes (Signed)
Dinner tray at bedside

## 2016-10-30 NOTE — Discharge Instructions (Signed)
Make an appointment to follow-up with your primary care doctor. Make up limited follow-up with orthopedics. Use the left arm sling. Take pain medicine as needed. Return for any new or worse symptoms.

## 2016-10-30 NOTE — ED Notes (Signed)
Tried contacting case manager twice but no one answered. Left message to have her call me back.

## 2016-10-30 NOTE — ED Notes (Signed)
All of diet coke, [place] sandwich, and applesauce was eaten.

## 2016-10-30 NOTE — ED Provider Notes (Addendum)
[facility] DEPT Provider Note   CSN: [id number] Arrival date & time: 10/30/16  1145     History   Chief Complaint Chief Complaint  Patient presents with  . Arm Pain  . Leg Swelling    HPI Juan [name] is a 76 y.o. male.  Patient brought in by EMS. With complaint of left arm pain. Bilateral leg swelling. Patient lives apparently at home with his son. Patient was seen and admitted for similar complaints in May specifically May 24. Workup for the arm with x-rays of the shoulder and MRI of his shoulder in the past with some findings but nothing acute. Patient states now the whole arm hurts. Including his left hand forearm and elbow. EMS put in makeshift sling. Patient otherwise in no distress. Not clear whether the patient has any home resources. Apparently somebody shows up once a week for checking his blood pressure. We'll have case manager investigate here.      Past Medical History:  Diagnosis Date  . Allergic rhinitis   . Anemia   . Ascending aortic aneurysm ([place]) 03/2014   4.3cm on CT scan  . CAD (coronary artery disease)    dx elsewheer in past, no documentation. Non-ischemic myoview 2007  . Chronic diastolic CHF (congestive heart failure), NYHA class 2 (HCC)    Normal EF w/ grade 1 dd by echo 12/2015  . Edema    R>L leg, u/s 5-12 neg for DVT  . Hemorrhoid   . History of thrombocytosis   . Hypertension   . Migraine    "once/wk at least" (07/11/2013)  . Myeloproliferative disease ([place])   . Shortness of breath   . Sinus congestion   . Sleep apnea, obstructive    at some point used CPAP, was d/c  years ago  . Type II diabetes mellitus [facility])     Patient Active Problem List   Diagnosis Date Noted  . 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 Pulmonary hypertension ([place])   . Sprain of left rotator cuff capsule   . Hyperkalemia   . Acute kidney injury superimposed on CKD ([place])   . Acute on chronic diastolic CHF (congestive heart failure) ([place]) 10/08/2016  . Fever 10/08/2016  .  Left arm pain 10/08/2016  . CHF (congestive heart failure) ([place]) 05/06/2016  . Paroxysmal SVT (supraventricular tachycardia) ([place]) 04/20/2016  . Type II diabetes mellitus ([place]) 04/20/2016  . Gout 04/20/2016  . Hyperuricemia 04/20/2016  . B12 deficiency 04/20/2016  . 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 Hydronephrosis   . Thrombocytosis ([place]) 04/07/2016  . Anemia 04/07/2016  . Pneumonia 04/01/2016  . Pressure injury of skin 04/01/2016  . Pressure ulcer stage II 12/31/2015  . UTI (urinary tract infection) 12/30/2015  . Sepsis ([place]) 12/29/2015  . UTI (lower urinary tract infection) 12/29/2015  . T wave inversion in EKG 12/29/2015  . PCP NOTES >>>>>>>>>>>>>>>>> 05/02/2015  . Arm pain   . Peripheral edema   . Unable to ambulate   . Thoracic aortic aneurysm ([place]) 04/26/2014  . CKD (chronic kidney disease) stage 3, GFR 30-59 ml/min 04/26/2014  . 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 Chest pain 04/15/2014  . Encounter for palliative care 04/06/2014  . Weakness generalized 04/06/2014  . Venous stasis dermatitis of both lower extremities   . Malnutrition of moderate degree ([place]) 03/31/2014  . Morbid obesity due to excess calories ([place]) 03/29/2014  . 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 Poor compliance with advise  05/05/2012  . Annual  physical exam 01/14/2012  . Weight loss 01/14/2012  . BPH (benign prostatic hyperplasia) 01/14/2012  . *Chronic lower extremity edema, stasis dermatitis 06/09/2011  . CARDIOMEGALY 04/29/2009  . *Prediabetes 09/07/2006  . Symptomatic anemia 09/07/2006  . 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 Obstructive sleep apnea 09/07/2006  . Essential hypertension 09/07/2006  . Coronary atherosclerosis 09/07/2006  . *CHF-- PCP comments 09/07/2006  . Allergic rhinitis 09/07/2006    Past Surgical History:  Procedure Laterality Date  . TOE SURGERY Right    "tried to straighten out big toe" (07/11/2013)         Home Medications    Prior to Admission medications   Medication Sig Start Date End Date Taking? Authorizing Provider  acetaminophen (TYLENOL) 500 MG tablet Take 500-1,000 mg by mouth every 6 (six) hours as needed.    [provider]  aspirin 325 MG tablet Take 325 mg by mouth daily.    [provider]  B Complex Vitamins (VITAMIN B COMPLEX PO) Take 100 mg by mouth daily.    [provider]  bisacodyl (DULCOLAX) 10 MG suppository Place 1 suppository (10 mg total) rectally daily as needed for moderate constipation. Patient not taking: Reported on 10/26/2016 04/18/16   Modena Jansky, MD  carvedilol (COREG) 3.125 MG tablet Take 1 tablet (3.125 mg total) by mouth 2 (two) times daily with a meal. 10/02/16   Tillery, Satira Mccallum, PA-C  diclofenac sodium (VOLTAREN) 1 % GEL Apply 4 g topically 2 (two) times daily as needed (pain). Apply to bilateral  knees     [provider]  docusate sodium (COLACE) 100 MG capsule Take 1 capsule (100 mg total) by mouth 2 (two) times daily as needed for mild constipation. Patient not taking: Reported on 10/26/2016 10/17/16   Rama, Venetia Maxon, MD  feeding supplement, GLUCERNA SHAKE, (GLUCERNA SHAKE) LIQD Take 118.5 mLs by mouth daily. Reported on 08/21/2015    [provider]  FERROUS GLUCONATE PO Take 240 mg by mouth daily at 10 pm. 1 tablet    [provider]  fluticasone (FLONASE) 50 MCG/ACT nasal spray Place 2 sprays into both nostrils daily. Reported on 05/02/2015    [provider]  gabapentin (NEURONTIN) 100 MG capsule Take 2 capsules (200 mg total) by mouth 3 (three) times daily as needed (pain). Reported on 08/22/2015 10/17/16   Rama, Venetia Maxon, MD  hydrALAZINE (APRESOLINE) 50 MG tablet Take 50 mg by mouth 3 (three) times daily.    [provider]  hydrocerin (EUCERIN) CREA Apply Eucerin cream to BLE Q day after bathing and roughly towel drying to remove loose skin Patient taking  differently: Apply 1 application topically daily as needed (dryness).  01/27/14   Ghimire, Henreitta Leber, MD  isosorbide mononitrate (IMDUR) 30 MG 24 hr tablet Take 30 mg by mouth 2 (two) times daily.    [provider]  lidocaine (XYLOCAINE) 5 % ointment Apply topically 3 (three) times daily. 10/17/16   Rama, Venetia Maxon, MD  methocarbamol (ROBAXIN) 750 MG tablet Take 1 tablet (750 mg total) by mouth 3 (three) times daily. 10/17/16   Rama, Venetia Maxon, MD  metolazone (ZAROXOLYN) 2.5 MG tablet Take 1 tablet (2.5 mg total) by mouth as directed. 10/23/16 10/23/17  Larey Dresser, MD  OXYGEN Inhale 2 L into the lungs continuous.    [provider]  potassium chloride SA (K-DUR,KLOR-CON) 20 MEQ tablet Take 20 mEq by mouth 2 (two) times daily. Taken with metolazone.    [provider]  simethicone (MYLICON) 80 MG chewable tablet Chew 80 mg by mouth every 6 (six) hours as needed for flatulence.    [provider]  torsemide (DEMADEX) 20 MG tablet Take 4 tablets (80 mg total) by mouth daily. 10/23/16 01/21/17  Larey Dresser, MD    Family History Family History  Problem Relation Age of Onset  . Schizophrenia Son   . Colon cancer Neg Hx   . Prostate cancer Neg Hx   . Heart attack Neg Hx   . Diabetes Neg Hx     Social History Social History  Substance Use Topics  . Smoking status: Former Smoker    Packs/day: 0.25    Years: 12.00    Types: Cigarettes    Quit date: 05/18/1966  . Smokeless tobacco: Never Used     Comment: quit smoking 45 years ago  . Alcohol use No     Allergies   Patient has no known allergies.   Review of Systems Review of Systems  Constitutional: Negative for fever.  HENT: Negative for congestion.   Eyes: Negative for redness.  Respiratory: Negative for shortness of breath.   Cardiovascular: Positive for leg swelling. Negative for chest pain.  Gastrointestinal: Negative for abdominal pain.  Genitourinary: Negative for hematuria.    Musculoskeletal: Positive for arthralgias.  Skin: Negative for rash.  Neurological: Negative for headaches.  Hematological: Does not bruise/bleed easily.  Psychiatric/Behavioral: Negative for confusion.     Physical Exam Updated Vital Signs BP (!) 113/58   Pulse 91   Temp 97.8 F (36.6 C) (Oral)   Resp 14   Ht 1.905 m (6\' 3" )   Wt 88 kg (194 lb)   SpO2 95%   BMI 24.25 kg/m   Physical Exam  Constitutional: He is oriented to person, place, and time. He appears well-developed and well-nourished. No distress.  HENT:  Head: Normocephalic and atraumatic.  Mouth/Throat: Oropharynx is clear and moist.  Eyes: Conjunctivae and EOM are normal. Pupils are equal, round, and reactive to light.  Neck: Normal range of motion. Neck supple.  Cardiovascular: Normal rate, regular rhythm and normal heart sounds.   Pulmonary/Chest: Effort normal and breath sounds normal. He has no rales.  Abdominal: Soft. Bowel sounds are normal.  Musculoskeletal: He exhibits edema and tenderness.  Patient with reasonable range of motion of the left arm. Has some swelling to the hand wrist area some increased warmth. Refill is 2 seconds are better. Good strong radial pulse 2+ both left and right. Does have some mild edema to bilateral legs.  Neurological: He is alert and oriented to person, place, and time. No cranial nerve deficit or sensory deficit. He exhibits normal muscle tone. Coordination normal.  Skin: Skin is warm.  Nursing note and vitals reviewed.    ED Treatments / Results  Labs (all labs ordered are listed, but only abnormal results are displayed) Labs Reviewed - No data to display  EKG  EKG Interpretation None       Radiology No results found.  Procedures Procedures (including critical care time)  Medications Ordered in ED Medications  0.9 %  sodium chloride infusion (75 mL/hr Intravenous New Bag/Given 10/30/16 1421)  ondansetron (ZOFRAN) injection 4 mg (4 mg Intravenous Given  10/30/16 1421)  HYDROmorphone (DILAUDID) injection 1 mg (1 mg Intravenous Given 10/30/16 1421)  HYDROmorphone (DILAUDID) injection 1 mg (1 mg Intravenous Given 10/30/16 1547)     Initial Impression / Assessment and Plan / ED Course  I have reviewed the  triage vital signs and the nursing notes.  Pertinent labs & imaging results that were available during my care of the patient were reviewed by me and considered in my medical decision making (see chart for details).     Patient has had the complaint of left arm pain for period of time. But not sure if there have been any recent injury or fall he denies. But will reassessed because there is swelling at the hand will get x-rays of hand forearm and humerus. And we'll have case manager evaluate for home health situation. Patient apparently lives with son but sounds like he pre-much as on his own.  Disposition based on x-rays of the hand forearm and humerus and case management evaluation. If patient's x-rays are negative will treat with a sling and a short course of pain medicine at home. Needs follow-up probably with orthopedics for the chronic pain.  Final Clinical Impressions(s) / ED Diagnoses   Final diagnoses:  Left arm pain    New Prescriptions New Prescriptions   No medications on file     Fredia Sorrow, MD 10/30/16 1658   Addendum:  Patient is followed by Dr. past primary care provider. Was seen on June 11. They are aware of the arm pain. X-rays here today without any acute bony abnormalities. We'll give referral to orthopedics will treat with a sling and pain medicine. We'll have case manager evaluate just to make sure the home situation is reasonable. But his primary care doctor most likely has a situation under control.   Fredia Sorrow, MD 10/30/16 1740

## 2016-10-30 NOTE — ED Triage Notes (Signed)
Per EMS, patient coming from home. Lives with son.  C/o increased swelling in left arm and pain 10/10.  Woke up about 3am with more swelling and pain.  XR showed nothing.  Also increased edema in both legs.  Sling placed on arm per patient request.  Able to move arm but has pain. 12 lead unremarkable.  Lungs clear.  132/70, 20 RR, 86 HR, 96% on 2L.  On 2L at home. CBG 138.

## 2016-10-30 NOTE — ED Notes (Signed)
This RN assisted patient with feeding. 60% of tray was eaten.

## 2016-10-31 ENCOUNTER — Inpatient Hospital Stay (HOSPITAL_COMMUNITY)
Admission: EM | Admit: 2016-10-31 | Discharge: 2016-11-04 | DRG: 194 | Disposition: A | Discharge status: Home Health | Payer: Medicare Other | Attending: Internal Medicine | Admitting: Internal Medicine

## 2016-10-31 ENCOUNTER — Emergency Department (HOSPITAL_COMMUNITY): Payer: Medicare Other

## 2016-10-31 ENCOUNTER — Encounter (HOSPITAL_COMMUNITY): Payer: Self-pay

## 2016-10-31 DIAGNOSIS — M109 Gout, unspecified: Secondary | ICD-10-CM | POA: Diagnosis present

## 2016-10-31 DIAGNOSIS — M25532 Pain in left wrist: Secondary | ICD-10-CM

## 2016-10-31 DIAGNOSIS — M25432 Effusion, left wrist: Secondary | ICD-10-CM | POA: Diagnosis present

## 2016-10-31 DIAGNOSIS — N183 Chronic kidney disease, stage 3 (moderate): Secondary | ICD-10-CM | POA: Diagnosis not present

## 2016-10-31 DIAGNOSIS — R05 Cough: Secondary | ICD-10-CM

## 2016-10-31 DIAGNOSIS — G4733 Obstructive sleep apnea (adult) (pediatric): Secondary | ICD-10-CM | POA: Diagnosis present

## 2016-10-31 DIAGNOSIS — M1990 Unspecified osteoarthritis, unspecified site: Secondary | ICD-10-CM

## 2016-10-31 DIAGNOSIS — Y95 Nosocomial condition: Secondary | ICD-10-CM | POA: Diagnosis present

## 2016-10-31 DIAGNOSIS — M7502 Adhesive capsulitis of left shoulder: Secondary | ICD-10-CM | POA: Diagnosis present

## 2016-10-31 DIAGNOSIS — E875 Hyperkalemia: Secondary | ICD-10-CM | POA: Diagnosis present

## 2016-10-31 DIAGNOSIS — E1122 Type 2 diabetes mellitus with diabetic chronic kidney disease: Secondary | ICD-10-CM | POA: Diagnosis not present

## 2016-10-31 DIAGNOSIS — Z7982 Long term (current) use of aspirin: Secondary | ICD-10-CM

## 2016-10-31 DIAGNOSIS — M79632 Pain in left forearm: Secondary | ICD-10-CM | POA: Diagnosis not present

## 2016-10-31 DIAGNOSIS — R52 Pain, unspecified: Secondary | ICD-10-CM | POA: Diagnosis not present

## 2016-10-31 DIAGNOSIS — D731 Hypersplenism: Secondary | ICD-10-CM | POA: Diagnosis not present

## 2016-10-31 DIAGNOSIS — I5032 Chronic diastolic (congestive) heart failure: Secondary | ICD-10-CM | POA: Diagnosis present

## 2016-10-31 DIAGNOSIS — J189 Pneumonia, unspecified organism: Secondary | ICD-10-CM | POA: Diagnosis not present

## 2016-10-31 DIAGNOSIS — I2510 Atherosclerotic heart disease of native coronary artery without angina pectoris: Secondary | ICD-10-CM | POA: Diagnosis present

## 2016-10-31 DIAGNOSIS — R918 Other nonspecific abnormal finding of lung field: Secondary | ICD-10-CM | POA: Diagnosis not present

## 2016-10-31 DIAGNOSIS — C946 Myelodysplastic disease, not classified: Secondary | ICD-10-CM | POA: Diagnosis present

## 2016-10-31 DIAGNOSIS — M79602 Pain in left arm: Secondary | ICD-10-CM | POA: Diagnosis not present

## 2016-10-31 DIAGNOSIS — Z9981 Dependence on supplemental oxygen: Secondary | ICD-10-CM

## 2016-10-31 DIAGNOSIS — R059 Cough, unspecified: Secondary | ICD-10-CM

## 2016-10-31 DIAGNOSIS — J309 Allergic rhinitis, unspecified: Secondary | ICD-10-CM | POA: Diagnosis present

## 2016-10-31 DIAGNOSIS — I712 Thoracic aortic aneurysm, without rupture: Secondary | ICD-10-CM | POA: Diagnosis present

## 2016-10-31 DIAGNOSIS — Z8701 Personal history of pneumonia (recurrent): Secondary | ICD-10-CM

## 2016-10-31 DIAGNOSIS — I130 Hypertensive heart and chronic kidney disease with heart failure and stage 1 through stage 4 chronic kidney disease, or unspecified chronic kidney disease: Secondary | ICD-10-CM | POA: Diagnosis not present

## 2016-10-31 DIAGNOSIS — Z87891 Personal history of nicotine dependence: Secondary | ICD-10-CM

## 2016-10-31 DIAGNOSIS — M79609 Pain in unspecified limb: Secondary | ICD-10-CM | POA: Diagnosis not present

## 2016-10-31 DIAGNOSIS — M7989 Other specified soft tissue disorders: Secondary | ICD-10-CM | POA: Diagnosis not present

## 2016-10-31 DIAGNOSIS — Z79899 Other long term (current) drug therapy: Secondary | ICD-10-CM

## 2016-10-31 DIAGNOSIS — R609 Edema, unspecified: Secondary | ICD-10-CM

## 2016-10-31 LAB — COMPREHENSIVE METABOLIC PANEL
ALBUMIN: 3 g/dL — AB (ref 3.5–5.0)
ALT: 13 U/L — ABNORMAL LOW (ref 17–63)
ANION GAP: 11 (ref 5–15)
AST: 22 U/L (ref 15–41)
Alkaline Phosphatase: 112 U/L (ref 38–126)
BILIRUBIN TOTAL: 0.6 mg/dL (ref 0.3–1.2)
BUN: 72 mg/dL — ABNORMAL HIGH (ref 6–20)
CO2: 25 mmol/L (ref 22–32)
Calcium: 9.8 mg/dL (ref 8.9–10.3)
Chloride: 102 mmol/L (ref 101–111)
Creatinine, Ser: 1.39 mg/dL — ABNORMAL HIGH (ref 0.61–1.24)
GFR calc non Af Amer: 48 mL/min — ABNORMAL LOW (ref >60)
GFR, EST AFRICAN AMERICAN: 56 mL/min — AB (ref >60)
GLUCOSE: 125 mg/dL — AB (ref 65–99)
POTASSIUM: 3.8 mmol/L (ref 3.5–5.1)
SODIUM: 138 mmol/L (ref 135–145)
TOTAL PROTEIN: 6.4 g/dL — AB (ref 6.5–8.1)

## 2016-10-31 LAB — CBC WITH DIFFERENTIAL/PLATELET
BASOS ABS: 0.5 10*3/uL — AB (ref 0.0–0.1)
Basophils Relative: 4 %
Eosinophils Absolute: 0 10*3/uL (ref 0.0–0.7)
Eosinophils Relative: 0 %
HEMATOCRIT: 33.4 % — AB (ref 39.0–52.0)
Hemoglobin: 10.2 g/dL — ABNORMAL LOW (ref 13.0–17.0)
LYMPHS PCT: 16 %
Lymphs Abs: 1.9 10*3/uL (ref 0.7–4.0)
MCH: 25.5 pg — ABNORMAL LOW (ref 26.0–34.0)
MCHC: 30.5 g/dL (ref 30.0–36.0)
MCV: 83.5 fL (ref 78.0–100.0)
MONOS PCT: 25 %
Monocytes Absolute: 3 10*3/uL — ABNORMAL HIGH (ref 0.1–1.0)
NEUTROS ABS: 6.4 10*3/uL (ref 1.7–7.7)
Neutrophils Relative %: 55 %
Platelets: 582 10*3/uL — ABNORMAL HIGH (ref 150–400)
RBC: 4 MIL/uL — ABNORMAL LOW (ref 4.22–5.81)
RDW: 19 % — ABNORMAL HIGH (ref 11.5–15.5)
WBC: 11.8 10*3/uL — ABNORMAL HIGH (ref 4.0–10.5)

## 2016-10-31 LAB — URINALYSIS, ROUTINE W REFLEX MICROSCOPIC
BILIRUBIN URINE: NEGATIVE
Bacteria, UA: NONE SEEN
GLUCOSE, UA: NEGATIVE mg/dL
HGB URINE DIPSTICK: NEGATIVE
Ketones, ur: NEGATIVE mg/dL
LEUKOCYTES UA: NEGATIVE
NITRITE: NEGATIVE
Specific Gravity, Urine: 1.012 (ref 1.005–1.030)
Squamous Epithelial / LPF: NONE SEEN
pH: 5 (ref 5.0–8.0)

## 2016-10-31 LAB — SEDIMENTATION RATE: SED RATE: 59 mm/h — AB (ref 0–16)

## 2016-10-31 LAB — C-REACTIVE PROTEIN: CRP: 5.1 mg/dL — ABNORMAL HIGH (ref <1.0)

## 2016-10-31 LAB — BRAIN NATRIURETIC PEPTIDE: B Natriuretic Peptide: 389.5 pg/mL — ABNORMAL HIGH (ref 0.0–100.0)

## 2016-10-31 LAB — TROPONIN I: TROPONIN I: 0.03 ng/mL — AB (ref <0.03)

## 2016-10-31 LAB — URIC ACID: Uric Acid, Serum: 14.4 mg/dL — ABNORMAL HIGH (ref 4.4–7.6)

## 2016-10-31 LAB — I-STAT CG4 LACTIC ACID, ED: LACTIC ACID, VENOUS: 1.1 mmol/L (ref 0.5–1.9)

## 2016-10-31 MED ORDER — DICLOFENAC SODIUM 1 % TD GEL: 4.0000 g | Freq: Two times a day (BID) | TRANSDERMAL | Status: DC | PRN

## 2016-10-31 MED ORDER — GLUCERNA SHAKE PO LIQD
118.5000 mL | Freq: Every day | ORAL | Status: DC
  Administered 2016-11-01 – 2016-11-04 (×3): 118.5 mL via ORAL

## 2016-10-31 MED ORDER — DEXTROSE 5 % IV SOLN
1.0000 g | Freq: Three times a day (TID) | INTRAVENOUS | Status: DC
  Administered 2016-10-31 – 2016-11-02 (×5): 1 g via INTRAVENOUS
  Filled 2016-10-31 (×7): qty 1

## 2016-10-31 MED ORDER — HYDROCODONE-ACETAMINOPHEN 5-325 MG PO TABS
1.0000 | ORAL_TABLET | Freq: Four times a day (QID) | ORAL | Status: DC | PRN
  Administered 2016-11-02 – 2016-11-04 (×5): 2 via ORAL
  Filled 2016-10-31 (×6): qty 2

## 2016-10-31 MED ORDER — B COMPLEX-C PO TABS
1.0000 | ORAL_TABLET | Freq: Every day | ORAL | Status: DC
  Administered 2016-11-01 – 2016-11-04 (×4): 1 via ORAL
  Filled 2016-10-31 (×4): qty 1

## 2016-10-31 MED ORDER — MORPHINE SULFATE (PF) 4 MG/ML IV SOLN
4.0000 mg | Freq: Once | INTRAVENOUS | Status: DC
  Filled 2016-10-31: qty 1

## 2016-10-31 MED ORDER — VITAMIN B COMPLEX PO TABS: ORAL_TABLET | Freq: Every day | ORAL | Status: DC

## 2016-10-31 MED ORDER — FLUTICASONE PROPIONATE 50 MCG/ACT NA SUSP
2.0000 | Freq: Every day | NASAL | Status: DC
  Administered 2016-11-01 – 2016-11-04 (×4): 2 via NASAL
  Filled 2016-10-31: qty 16

## 2016-10-31 MED ORDER — CARVEDILOL 3.125 MG PO TABS
3.1250 mg | ORAL_TABLET | Freq: Two times a day (BID) | ORAL | Status: DC
  Administered 2016-10-31 – 2016-11-04 (×9): 3.125 mg via ORAL
  Filled 2016-10-31 (×9): qty 1

## 2016-10-31 MED ORDER — CARVEDILOL 3.125 MG PO TABS: 3.1250 mg | ORAL_TABLET | Freq: Two times a day (BID) | ORAL | Status: DC

## 2016-10-31 MED ORDER — POTASSIUM CHLORIDE CRYS ER 20 MEQ PO TBCR
20.0000 meq | EXTENDED_RELEASE_TABLET | Freq: Two times a day (BID) | ORAL | Status: DC
  Administered 2016-11-01 – 2016-11-02 (×4): 20 meq via ORAL
  Filled 2016-10-31 (×4): qty 1

## 2016-10-31 MED ORDER — KETOROLAC TROMETHAMINE 30 MG/ML IJ SOLN
15.0000 mg | Freq: Once | INTRAMUSCULAR | Status: AC
  Administered 2016-10-31: 15 mg via INTRAVENOUS
  Filled 2016-10-31: qty 1

## 2016-10-31 MED ORDER — DOCUSATE SODIUM 100 MG PO CAPS: 100.0000 mg | ORAL_CAPSULE | Freq: Two times a day (BID) | ORAL | Status: DC | PRN

## 2016-10-31 MED ORDER — GABAPENTIN 100 MG PO CAPS
200.0000 mg | ORAL_CAPSULE | Freq: Three times a day (TID) | ORAL | Status: DC | PRN
  Administered 2016-11-02 – 2016-11-03 (×2): 200 mg via ORAL
  Filled 2016-10-31 (×2): qty 2

## 2016-10-31 MED ORDER — ORAL CARE MOUTH RINSE
15.0000 mL | Freq: Two times a day (BID) | OROMUCOSAL | Status: DC
  Administered 2016-11-01 – 2016-11-04 (×4): 15 mL via OROMUCOSAL

## 2016-10-31 MED ORDER — BISACODYL 10 MG RE SUPP: 10.0000 mg | Freq: Every day | RECTAL | Status: DC | PRN

## 2016-10-31 MED ORDER — METHOCARBAMOL 750 MG PO TABS
750.0000 mg | ORAL_TABLET | Freq: Three times a day (TID) | ORAL | Status: DC
  Administered 2016-11-01 – 2016-11-04 (×10): 750 mg via ORAL
  Filled 2016-10-31: qty 1
  Filled 2016-10-31: qty 1.5
  Filled 2016-10-31: qty 2
  Filled 2016-10-31 (×3): qty 1
  Filled 2016-10-31: qty 2
  Filled 2016-10-31: qty 1.5
  Filled 2016-10-31: qty 1
  Filled 2016-10-31: qty 2
  Filled 2016-10-31: qty 1
  Filled 2016-10-31 (×2): qty 2

## 2016-10-31 MED ORDER — TORSEMIDE 20 MG PO TABS
80.0000 mg | ORAL_TABLET | Freq: Every day | ORAL | Status: DC
  Administered 2016-11-01 – 2016-11-02 (×2): 80 mg via ORAL
  Filled 2016-10-31 (×2): qty 4

## 2016-10-31 MED ORDER — HYDRALAZINE HCL 50 MG PO TABS
50.0000 mg | ORAL_TABLET | Freq: Three times a day (TID) | ORAL | Status: DC
  Administered 2016-11-01 – 2016-11-04 (×11): 50 mg via ORAL
  Filled 2016-10-31 (×12): qty 1

## 2016-10-31 MED ORDER — FERROUS GLUCONATE 324 (38 FE) MG PO TABS: 240.0000 mg | ORAL_TABLET | Freq: Every day | ORAL | Status: DC

## 2016-10-31 MED ORDER — HYDRALAZINE HCL 25 MG PO TABS
50.0000 mg | ORAL_TABLET | Freq: Once | ORAL | Status: AC
  Administered 2016-10-31: 50 mg via ORAL
  Filled 2016-10-31: qty 2

## 2016-10-31 MED ORDER — FERROUS GLUCONATE 324 (38 FE) MG PO TABS
324.0000 mg | ORAL_TABLET | Freq: Every day | ORAL | Status: DC
  Administered 2016-11-01 – 2016-11-03 (×3): 324 mg via ORAL
  Filled 2016-10-31 (×5): qty 1

## 2016-10-31 MED ORDER — DEXTROSE 5 % IV SOLN
1.0000 g | Freq: Once | INTRAVENOUS | Status: AC
  Administered 2016-10-31: 1 g via INTRAVENOUS
  Filled 2016-10-31: qty 1

## 2016-10-31 MED ORDER — VANCOMYCIN HCL IN DEXTROSE 750-5 MG/150ML-% IV SOLN
750.0000 mg | Freq: Two times a day (BID) | INTRAVENOUS | Status: DC
  Administered 2016-11-01 – 2016-11-02 (×3): 750 mg via INTRAVENOUS
  Filled 2016-10-31 (×5): qty 150

## 2016-10-31 MED ORDER — SIMETHICONE 80 MG PO CHEW: 80.0000 mg | CHEWABLE_TABLET | Freq: Four times a day (QID) | ORAL | Status: DC | PRN

## 2016-10-31 MED ORDER — ISOSORBIDE MONONITRATE ER 30 MG PO TB24
30.0000 mg | ORAL_TABLET | Freq: Two times a day (BID) | ORAL | Status: DC
  Administered 2016-11-01 – 2016-11-04 (×7): 30 mg via ORAL
  Filled 2016-10-31 (×7): qty 1

## 2016-10-31 MED ORDER — KETOROLAC TROMETHAMINE 15 MG/ML IJ SOLN
7.5000 mg | Freq: Four times a day (QID) | INTRAMUSCULAR | Status: DC | PRN
  Administered 2016-11-01: 7.5 mg via INTRAVENOUS
  Filled 2016-10-31: qty 1

## 2016-10-31 MED ORDER — ASPIRIN EC 325 MG PO TBEC
325.0000 mg | DELAYED_RELEASE_TABLET | Freq: Once | ORAL | Status: AC
  Administered 2016-10-31: 325 mg via ORAL
  Filled 2016-10-31: qty 1

## 2016-10-31 MED ORDER — VANCOMYCIN HCL IN DEXTROSE 1-5 GM/200ML-% IV SOLN
1000.0000 mg | Freq: Once | INTRAVENOUS | Status: AC
  Administered 2016-10-31: 1000 mg via INTRAVENOUS
  Filled 2016-10-31: qty 200

## 2016-10-31 MED ORDER — ASPIRIN 325 MG PO TABS
325.0000 mg | ORAL_TABLET | Freq: Every day | ORAL | Status: DC
  Administered 2016-11-01 – 2016-11-04 (×4): 325 mg via ORAL
  Filled 2016-10-31 (×5): qty 1

## 2016-10-31 MED ORDER — METOLAZONE 2.5 MG PO TABS: 2.5000 mg | ORAL_TABLET | ORAL | Status: DC

## 2016-10-31 MED ORDER — LIDOCAINE 5 % EX OINT
TOPICAL_OINTMENT | Freq: Three times a day (TID) | CUTANEOUS | Status: DC
  Administered 2016-11-01: 1 via TOPICAL
  Administered 2016-11-01 – 2016-11-04 (×7): via TOPICAL
  Filled 2016-10-31: qty 35.44

## 2016-10-31 MED ORDER — MORPHINE SULFATE (PF) 4 MG/ML IV SOLN
4.0000 mg | Freq: Once | INTRAVENOUS | Status: AC
  Administered 2016-10-31: 4 mg via INTRAVENOUS
  Filled 2016-10-31: qty 1

## 2016-10-31 MED ORDER — ACETAMINOPHEN 500 MG PO TABS: 500.0000 mg | ORAL_TABLET | Freq: Four times a day (QID) | ORAL | Status: DC | PRN

## 2016-10-31 MED ORDER — HEPARIN SODIUM (PORCINE) 5000 UNIT/ML IJ SOLN
5000.0000 [IU] | Freq: Three times a day (TID) | INTRAMUSCULAR | Status: DC
  Administered 2016-10-31 – 2016-11-04 (×10): 5000 [IU] via SUBCUTANEOUS
  Filled 2016-10-31 (×10): qty 1

## 2016-10-31 NOTE — ED Notes (Signed)
Pt transporting to MRI. NAD. VSS at departure from department

## 2016-10-31 NOTE — ED Provider Notes (Signed)
[facility] DEPT Provider Note   CSN: [id number] Arrival date & time: 10/31/16  0126     History   Chief Complaint Chief Complaint  Patient presents with  . Arm Pain    HPI Juan [name] is a 76 y.o. male.  HPI Patient reports is having severe left arm pain. Patient is reporting that his only been there for several days. Review of EMR however indicates that he was admitted 5\24 with left arm pain that was evaluated by MRI, orthopedic consultation and general medical care for congestive heart failure. Patient reports that the arm is swollen and pain is concentrated around his wrist and lower forearm. He states any type of movement or touch is extremely painful. He reports he did try pain medications at home and has done nothing to alleviate pain. He is currently denying other review of systems however he is saying "Doc, I'm really sick. I'm just really sick." He denies any injury to the arm. Past Medical History:  Diagnosis Date  . Allergic rhinitis   . Anemia   . Ascending aortic aneurysm ([place]) 03/2014   4.3cm on CT scan  . CAD (coronary artery disease)    dx elsewheer in past, no documentation. Non-ischemic myoview 2007  . Chronic diastolic CHF (congestive heart failure), NYHA class 2 (HCC)    Normal EF w/ grade 1 dd by echo 12/2015  . Edema    R>L leg, u/s 5-12 neg for DVT  . Hemorrhoid   . History of thrombocytosis   . Hypertension   . Migraine    "once/wk at least" (07/11/2013)  . Myeloproliferative disease ([place])   . Shortness of breath   . Sinus congestion   . Sleep apnea, obstructive    at some point used CPAP, was d/c  years ago  . Type II diabetes mellitus [facility])     Patient Active Problem List   Diagnosis Date Noted  . 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 Pulmonary hypertension ([place])   . Sprain of left rotator cuff capsule   . Hyperkalemia   . Acute kidney injury superimposed on CKD ([place])   . Acute on chronic diastolic CHF (congestive heart failure) ([place]) 10/08/2016    . Fever 10/08/2016  . Left arm pain 10/08/2016  . CHF (congestive heart failure) ([place]) 05/06/2016  . Paroxysmal SVT (supraventricular tachycardia) ([place]) 04/20/2016  . Type II diabetes mellitus ([employer]) 04/20/2016  . Gout 04/20/2016  . Hyperuricemia 04/20/2016  . B12 deficiency 04/20/2016  . 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 Hydronephrosis   . Thrombocytosis ([place]) 04/07/2016  . Anemia 04/07/2016  . Pneumonia 04/01/2016  . Pressure injury of skin 04/01/2016  . Pressure ulcer stage II 12/31/2015  . UTI (urinary tract infection) 12/30/2015  . Sepsis ([place]) 12/29/2015  . UTI (lower urinary tract infection) 12/29/2015  . T wave inversion in EKG 12/29/2015  . PCP NOTES >>>>>>>>>>>>>>>>> 05/02/2015  . Arm pain   . Peripheral edema   . Unable to ambulate   . Thoracic aortic aneurysm ([place]) 04/26/2014  . CKD (chronic kidney disease) stage 3, GFR 30-59 ml/min 04/26/2014  . 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 Chest pain 04/15/2014  . Encounter for palliative care 04/06/2014  . Weakness generalized 04/06/2014  . Venous stasis dermatitis of both lower extremities   . Malnutrition of moderate degree ([place]) 03/31/2014  . Morbid obesity due to excess calories ([place]) 03/29/2014  . 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 Poor compliance with advise  05/05/2012  .  Annual physical exam 01/14/2012  . Weight loss 01/14/2012  . BPH (benign prostatic hyperplasia) 01/14/2012  . *Chronic lower extremity edema, stasis dermatitis 06/09/2011  . CARDIOMEGALY 04/29/2009  . *Prediabetes 09/07/2006  . Symptomatic anemia 09/07/2006  . Myeloproliferative disease ([place]) 09/07/2006  . Obstructive sleep apnea 09/07/2006  . Essential hypertension 09/07/2006  . Coronary atherosclerosis 09/07/2006  . *CHF-- PCP comments 09/07/2006  . Allergic rhinitis 09/07/2006    Past Surgical History:  Procedure Laterality Date  . TOE SURGERY Right    "tried to straighten  out big toe" (07/11/2013)       Home Medications    Prior to Admission medications   Medication Sig Start Date End Date Taking? Authorizing Provider  acetaminophen (TYLENOL) 500 MG tablet Take 500-1,000 mg by mouth every 6 (six) hours as needed.    [provider]  aspirin 325 MG tablet Take 325 mg by mouth daily.    [provider]  B Complex Vitamins (VITAMIN B COMPLEX PO) Take 100 mg by mouth daily.    [provider]  bisacodyl (DULCOLAX) 10 MG suppository Place 1 suppository (10 mg total) rectally daily as needed for moderate constipation. 04/18/16   Modena Jansky, MD  carvedilol (COREG) 3.125 MG tablet Take 1 tablet (3.125 mg total) by mouth 2 (two) times daily with a meal. 10/02/16   Tillery, Satira Mccallum, PA-C  diclofenac sodium (VOLTAREN) 1 % GEL Apply 4 g topically 2 (two) times daily as needed (pain). Apply to bilateral  knees     [provider]  docusate sodium (COLACE) 100 MG capsule Take 1 capsule (100 mg total) by mouth 2 (two) times daily as needed for mild constipation. 10/17/16   Rama, Venetia Maxon, MD  feeding supplement, GLUCERNA SHAKE, (GLUCERNA SHAKE) LIQD Take 118.5 mLs by mouth daily. Reported on 08/21/2015    [provider]  FERROUS GLUCONATE PO Take 240 mg by mouth daily at 10 pm. 1 tablet    [provider]  fluticasone (FLONASE) 50 MCG/ACT nasal spray Place 2 sprays into both nostrils daily. Reported on 05/02/2015    [provider]  gabapentin (NEURONTIN) 100 MG capsule Take 2 capsules (200 mg total) by mouth 3 (three) times daily as needed (pain). Reported on 08/22/2015 10/17/16   Rama, Venetia Maxon, MD  hydrALAZINE (APRESOLINE) 50 MG tablet Take 50 mg by mouth 3 (three) times daily.    [provider]  hydrocerin (EUCERIN) CREA Apply Eucerin cream to BLE Q day after bathing and roughly towel drying to remove loose skin Patient taking differently: Apply 1 application topically daily as needed  (dryness).  01/27/14   Ghimire, Henreitta Leber, MD  HYDROcodone-acetaminophen (NORCO/VICODIN) 5-325 MG tablet Take 1-2 tablets by mouth every 6 (six) hours as needed for moderate pain. 10/30/16   Fredia Sorrow, MD  isosorbide mononitrate (IMDUR) 30 MG 24 hr tablet Take 30 mg by mouth 2 (two) times daily.    [provider]  lidocaine (XYLOCAINE) 5 % ointment Apply topically 3 (three) times daily. 10/17/16   Rama, Venetia Maxon, MD  methocarbamol (ROBAXIN) 750 MG tablet Take 1 tablet (750 mg total) by mouth 3 (three) times daily. 10/17/16   Rama, Venetia Maxon, MD  metolazone (ZAROXOLYN) 2.5 MG tablet Take 1 tablet (2.5 mg total) by mouth as directed. 10/23/16 10/23/17  Larey Dresser, MD  OXYGEN Inhale 2 L into the lungs continuous.    [provider]  potassium chloride SA (K-DUR,KLOR-CON) 20 MEQ tablet Take 20  mEq by mouth 2 (two) times daily. Taken with metolazone.    [provider]  simethicone (MYLICON) 80 MG chewable tablet Chew 80 mg by mouth every 6 (six) hours as needed for flatulence.    [provider]  torsemide (DEMADEX) 20 MG tablet Take 4 tablets (80 mg total) by mouth daily. 10/23/16 01/21/17  Larey Dresser, MD    Family History Family History  Problem Relation Age of Onset  . Schizophrenia Son   . Colon cancer Neg Hx   . Prostate cancer Neg Hx   . Heart attack Neg Hx   . Diabetes Neg Hx     Social History Social History  Substance Use Topics  . Smoking status: Former Smoker    Packs/day: 0.25    Years: 12.00    Types: Cigarettes    Quit date: 05/18/1966  . Smokeless tobacco: Never Used     Comment: quit smoking 45 years ago  . Alcohol use No     Allergies   Patient has no known allergies.   Review of Systems Review of Systems 10 Systems reviewed and are negative for acute change except as noted in the HPI.   Physical Exam Updated Vital Signs BP (!) 173/80 (BP Location: Right Arm)   Pulse 78   Temp 98.4 F (36.9 C)   Resp 16    SpO2 98%   Physical Exam  Constitutional: He is oriented to person, place, and time.  Patient is alert and nontoxic. No respiratory distress. He does appear physically deconditioned. He is in pain.  HENT:  Head: Normocephalic and atraumatic.  Nose: Nose normal.  Eyes: EOM are normal.  Neck: Neck supple.  I do not appreciate any specific cervical lymphadenopathy, soft tissue swelling or mass.  Cardiovascular: Normal rate, regular rhythm, normal heart sounds and intact distal pulses.   Pulmonary/Chest:  No respiratory distress. Good air flow. Fine basilar rails bilaterally.  Abdominal: Soft. He exhibits no distension. There is no tenderness. There is no guarding.  Musculoskeletal:  Examination of upper body shows shoulders to be symmetric. Patient is physically deconditioned in the upper body with very little musculature of the chest and shoulders. He has no palpable pain in the left shoulder, humerus or elbow. He denies pain with range of motion in these joints however he is adverse to doing this because he reports it hurts his forearm and wrist so much to move the arm at all. There is edema and swelling of the left wrist and distal forearm. Some faint erythema over the ulnar aspect of the forearm in the area of the carpals. No swelling or edema of the fingers. Cap refill in the fingers is brisk. Patient does not have significant pain to palpation of the fingers. Right hand and wrist are normal without any swelling or effusions. Bilateral lower extremities have 2-3+ edema. Skin changes of chronic dermatitis of the lower extremities.  Neurological: He is alert and oriented to person, place, and time. No cranial nerve deficit. He exhibits normal muscle tone. Coordination normal.  Skin: Skin is warm and dry.  Psychiatric:  Patient is somewhat anxious and expressing pain.     ED Treatments / Results  Labs (all labs ordered are listed, but only abnormal results are displayed) Labs Reviewed    CULTURE, BLOOD (ROUTINE X 2)  CULTURE, BLOOD (ROUTINE X 2)  COMPREHENSIVE METABOLIC PANEL  CBC WITH DIFFERENTIAL/PLATELET  URINALYSIS, ROUTINE W REFLEX MICROSCOPIC  SEDIMENTATION RATE  C-REACTIVE PROTEIN  URIC ACID  RPR  I-STAT CG4 LACTIC ACID, ED    EKG  EKG Interpretation None       Radiology Dg Forearm Left  Result Date: 10/30/2016 CLINICAL DATA:  Arm pain beginning today. EXAM: LEFT FOREARM - 2 VIEW COMPARISON:  None. FINDINGS: Diffuse sclerotic and permeative appearance of the visualized skeleta, present elsewhere in the skeleton by chest CT since at least 2015. No superimposed fracture or malalignment. No suspected elbow joint effusion. IMPRESSION: 1. No acute finding. 2. Chronic sclerotic and permeative appearance of the skeleton in this patient with history of myeloproliferative disease. Electronically Signed   By: Monte Fantasia M.D.   On: 10/30/2016 17:22   Dg Humerus Left  Result Date: 10/30/2016 CLINICAL DATA:  Left humerus pain beginning this morning. No specific injury. EXAM: LEFT HUMERUS - 2+ VIEW COMPARISON:  None. FINDINGS: Mottled sclerotic bones diffusely correlating with history of myeloproliferative disease. These changes have been seen elsewhere in the skeleton since at least 2015 chest CT. No acute fracture or focal bony erosion. No acute soft tissue finding. There is question of 13 mm nodule over the left mid lung, but none seen on dedicated chest x-ray from 2 weeks prior. IMPRESSION: 1. No acute finding. 2. Chronic sclerotic and permeative appearance of the skeleton correlating with history of myeloproliferative disease. Electronically Signed   By: Monte Fantasia M.D.   On: 10/30/2016 17:21   Dg Hand Complete Left  Result Date: 10/30/2016 CLINICAL DATA:  Generalized left upper extremity pain. EXAM: LEFT HAND - COMPLETE 3+ VIEW COMPARISON:  None. FINDINGS: Diffuse osteopenia. No evidence of fracture or dislocation. Osteoarthritic changes at the radiocarpal  joint, intercarpal joints and first carpometacarpal joint. IMPRESSION: No acute fracture or dislocation identified about the left head. Multilevel osteoarthritic changes. Electronically Signed   By: Fidela Salisbury M.D.   On: 10/30/2016 17:23    Procedures Procedures (including critical care time)  Medications Ordered in ED Medications  morphine 4 MG/ML injection 4 mg (not administered)  ketorolac (TORADOL) 30 MG/ML injection 15 mg (not administered)     Initial Impression / Assessment and Plan / ED Course  I have reviewed the triage vital signs and the nursing notes.  Pertinent labs & imaging results that were available during my care of the patient were reviewed by me and considered in my medical decision making (see chart for details).      Final Clinical Impressions(s) / ED Diagnoses   Final diagnoses:  Left Upper extremity swelling Intractable Pain HCAP  Patient has returned with severe pain in his left wrist and forearm. He does have significant swelling around the wrist joint and distal forearm. At this time there do not appear to be any problems at the elbow or shoulder which had been evaluated at his prior presentation. Patient reports pain was not mitigated at all by home medications. At this time, consideration is for gout versus septic arthritis versus other arthritides. Secondarily patient also has general malaise and weakness. Chest x-ray suggested pneumonia. Patient does not have respiratory distress or mental status change. Plan will be for admission for treatment of pneumonia and continued diagnostic evaluation of severe wrist and forearm pain with swelling.  New Prescriptions New Prescriptions   No medications on file     Charlesetta Shanks, MD 11/09/16 [phone]

## 2016-10-31 NOTE — ED Notes (Signed)
Tray ordered:  Low sodium, diet carb modified.

## 2016-10-31 NOTE — ED Notes (Signed)
Phlebotomy at the bedside  

## 2016-10-31 NOTE — Progress Notes (Signed)
Patient arrived on unit via stretcher from ED.  No family at bedside.  Telemetry placed per MD order and CMT notified.  

## 2016-10-31 NOTE — ED Notes (Signed)
Pt is in waiting room covered in blankets. He states he has urinated on his self. Pt refuses to be taken back to triage so he can be cleaned up. He only wants a towel to place between his legs. Pt is taken a towel and pt is requesting the wait time he states too many people is going back before him.

## 2016-10-31 NOTE — ED Notes (Signed)
Attempted IV. Unable to access

## 2016-10-31 NOTE — ED Notes (Addendum)
IV Team at the bedside. Pt returned from [place]

## 2016-10-31 NOTE — ED Notes (Signed)
Tried to give pt socks multiple times but pt refused

## 2016-10-31 NOTE — Progress Notes (Signed)
RN attempted to given HS medication to patient and meds were not verified by pharmacy. Called Kevin-pharmacist and he stated it's "because patient was unable to verify home meds with tech earlier". However, Lennette Bihari will "see what he can verify". Advised RN to call family to bring in list or meds for pharmacy to verify. [name] (daughter), patients only listed contact called and she stated "that she has no idea what her father takes" as she does not live with him. Furthermore, she was not ever aware that her father was admitted to hospital. RN then asked if there was anyone else to call and she stated that "patient lives with her brother [name]", whom does not have a phone but will be into visit. RN will pass on to nest shift nurse.

## 2016-10-31 NOTE — Progress Notes (Signed)
ANTIBIOTIC CONSULT NOTE - INITIAL  Pharmacy Consult for Vancomycin Indication: pneumonia  Not on File  Patient Measurements:   Adjusted Body Weight:    Vital Signs: BP: 127/66 (06/16 1600) Pulse Rate: 76 (06/16 1600) Intake/Output from previous day: No intake/output data recorded. Intake/Output from this shift: No intake/output data recorded.  Labs:  Recent Labs  10/31/16 0810  WBC 11.8*  HGB 10.2*  PLT 582*  CREATININE 1.39*   Estimated Creatinine Clearance: 55.7 mL/min (A) (by C-G formula based on SCr of 1.39 mg/dL (H)). No results for input(s): VANCOTROUGH, VANCOPEAK, VANCORANDOM, GENTTROUGH, GENTPEAK, GENTRANDOM, TOBRATROUGH, TOBRAPEAK, TOBRARND, AMIKACINPEAK, AMIKACINTROU, AMIKACIN in the last 72 hours.   Microbiology:   Medical History: Past Medical History:  Diagnosis Date  . Allergic rhinitis   . Anemia   . Ascending aortic aneurysm ([place]) 03/2014   4.3cm on CT scan  . CAD (coronary artery disease)    dx elsewheer in past, no documentation. Non-ischemic myoview 2007  . Chronic diastolic CHF (congestive heart failure), NYHA class 2 (HCC)    Normal EF w/ grade 1 dd by echo 12/2015  . Edema    R>L leg, u/s 5-12 neg for DVT  . Hemorrhoid   . History of thrombocytosis   . Hypertension   . Migraine    "once/wk at least" (07/11/2013)  . Myeloproliferative disease ([place])   . Shortness of breath   . Sinus congestion   . Sleep apnea, obstructive    at some point used CPAP, was d/c  years ago  . Type II diabetes mellitus (HCC)     Assessment: Severe L arm pain for several days. Admitted 5/24 with left arm pain that was evaluated by MRI, orthopedic consultation and general medical care for congestive heart failure.   ID:  Recurrent PNA Afebrile. WBC 11.8. Scr 1.39, LA 1.1,    Goal of Therapy:  Vancomycin trough level 15-20 mcg/ml  Plan:  Cefepime 1g IV q8hr Vanco 1g IV x 1 then 750mg  IV q12h Vanco trough at steady state after 3-5 doses.    Cori Justus  S. Alford Highland, PharmD, BCPS Clinical Staff Pharmacist Pager [phone]  Eilene Ghazi Stillinger 10/31/2016,4:58 PM

## 2016-10-31 NOTE — ED Notes (Signed)
MRI not started earlier d/t pt concerns that last MRI in May caused left arm paralysis and swelling.  Dr. Johnney Killian in to talk with pt to address concerns.  Pt agrees to MRI.  Plan to give pain med prior to MRI.  Called MRI, pt on table and will take around 3 hrs.  MD advised.  Will hold Morphine for now.  Ordered lunch tray.

## 2016-10-31 NOTE — ED Notes (Signed)
Pt a hard stick and phlebotomy just left patient's bedside

## 2016-10-31 NOTE — ED Notes (Signed)
MD Pfeiffer at the bedside

## 2016-10-31 NOTE — H&P (Signed)
History and Physical    Kieth [name] OZD:[id number] DOB: [birth year] DOA: 10/31/2016  Referring MD/NP/PA: EDP PCP: Colon Branch, MD  Outpatient Specialists: [facility] Patient coming from: Home  Chief Complaint: Left wrist pain  HPI: Juan [name] is a 76 y.o. male with medical history significant for but not limited to myeloproliferative disorder presenting with several days history of progressively worsening severe left wrist aching pain radiating to ipsilateral lower forearm associated with swelling of the left wrist, unable to touch it, not relieved by home remedy. Patient was admitted for acute CHF and left upper extremity pain which was evaluated in consultation by [facility] orthoped[facility] in May 2018, but this was mainly centered around his left upper arm/shoulder with no remarkable findings.   In addition, he has been feeling poorly over the last 1 month, progressively worsening over last 2-3 days wwith poor appetite, chills without fever, mild shortness of breath and nonproductive cough.   ED Course: Patient was hemodynamically hemodynamically stable and had no acute changes on x-rays of the left forearm and left humerus as well as left hand. Chest x-ray showed bilateral interstitial airspace opacities consistent with infection versus edema with diffuse osteosclerosis.   His sedimentation rate was more than 50 and MRI of the left hand/wrist was ordered and given pain support for his acute extremity pain which had failed outpatient treatment.  Patient admitted for observation   Review of Systems: As per HPI otherwise 10 point review of systems negative.    Past Medical History:  Diagnosis Date  . Allergic rhinitis   . Anemia   . Ascending aortic aneurysm ([place]) 03/2014   4.3cm on CT scan  . CAD (coronary artery disease)    dx elsewheer in past, no documentation. Non-ischemic myoview 2007  . Chronic diastolic CHF (congestive heart failure), NYHA class 2 (HCC)    Normal EF w/  grade 1 dd by echo 12/2015  . Edema    R>L leg, u/s 5-12 neg for DVT  . Hemorrhoid   . History of thrombocytosis   . Hypertension   . Migraine    "once/wk at least" (07/11/2013)  . Myeloproliferative disease ([place])   . Shortness of breath   . Sinus congestion   . Sleep apnea, obstructive    at some point used CPAP, was d/c  years ago  . Type II diabetes mellitus ([place])     Past Surgical History:  Procedure Laterality Date  . TOE SURGERY Right    "tried to straighten out big toe" (07/11/2013)     reports that he quit smoking about 50 years ago. His smoking use included Cigarettes. He has a 3.00 pack-year smoking history. He has never used smokeless tobacco. He reports that he does not drink alcohol or use drugs.  Not on File  Family History  Problem Relation Age of Onset  . Schizophrenia Son   . Colon cancer Neg Hx   . Prostate cancer Neg Hx   . Heart attack Neg Hx   . Diabetes Neg Hx      Prior to Admission medications   Medication Sig Start Date End Date Taking? Authorizing Provider  acetaminophen (TYLENOL) 500 MG tablet Take 500-1,000 mg by mouth every 6 (six) hours as needed.    [provider]  aspirin 325 MG tablet Take 325 mg by mouth daily.    [provider]  B Complex Vitamins (VITAMIN B COMPLEX PO) Take 100 mg by mouth daily.    [provider]  bisacodyl (DULCOLAX) 10 MG suppository Place 1 suppository (10 mg total) rectally daily as needed for moderate constipation. 04/18/16   Modena Jansky, MD  carvedilol (COREG) 3.125 MG tablet Take 1 tablet (3.125 mg total) by mouth 2 (two) times daily with a meal. 10/02/16   Tillery, Satira Mccallum, PA-C  diclofenac sodium (VOLTAREN) 1 % GEL Apply 4 g topically 2 (two) times daily as needed (pain). Apply to bilateral  knees     [provider]  docusate sodium (COLACE) 100 MG capsule Take 1 capsule (100 mg total) by mouth 2 (two) times daily as needed for mild constipation. 10/17/16   Rama,  Venetia Maxon, MD  feeding supplement, GLUCERNA SHAKE, (GLUCERNA SHAKE) LIQD Take 118.5 mLs by mouth daily. Reported on 08/21/2015    [provider]  FERROUS GLUCONATE PO Take 240 mg by mouth daily at 10 pm. 1 tablet    [provider]  fluticasone (FLONASE) 50 MCG/ACT nasal spray Place 2 sprays into both nostrils daily. Reported on 05/02/2015    [provider]  gabapentin (NEURONTIN) 100 MG capsule Take 2 capsules (200 mg total) by mouth 3 (three) times daily as needed (pain). Reported on 08/22/2015 10/17/16   Rama, Venetia Maxon, MD  hydrALAZINE (APRESOLINE) 50 MG tablet Take 50 mg by mouth 3 (three) times daily.    [provider]  hydrocerin (EUCERIN) CREA Apply Eucerin cream to BLE Q day after bathing and roughly towel drying to remove loose skin Patient taking differently: Apply 1 application topically daily as needed (dryness).  01/27/14   Ghimire, Henreitta Leber, MD  HYDROcodone-acetaminophen (NORCO/VICODIN) 5-325 MG tablet Take 1-2 tablets by mouth every 6 (six) hours as needed for moderate pain. 10/30/16   Fredia Sorrow, MD  isosorbide mononitrate (IMDUR) 30 MG 24 hr tablet Take 30 mg by mouth 2 (two) times daily.    [provider]  lidocaine (XYLOCAINE) 5 % ointment Apply topically 3 (three) times daily. 10/17/16   Rama, Venetia Maxon, MD  methocarbamol (ROBAXIN) 750 MG tablet Take 1 tablet (750 mg total) by mouth 3 (three) times daily. 10/17/16   Rama, Venetia Maxon, MD  metolazone (ZAROXOLYN) 2.5 MG tablet Take 1 tablet (2.5 mg total) by mouth as directed. 10/23/16 10/23/17  Larey Dresser, MD  OXYGEN Inhale 2 L into the lungs continuous.    [provider]  potassium chloride SA (K-DUR,KLOR-CON) 20 MEQ tablet Take 20 mEq by mouth 2 (two) times daily. Taken with metolazone.    [provider]  simethicone (MYLICON) 80 MG chewable tablet Chew 80 mg by mouth every 6 (six) hours as needed for flatulence.    [provider]  torsemide  (DEMADEX) 20 MG tablet Take 4 tablets (80 mg total) by mouth daily. 10/23/16 01/21/17  Larey Dresser, MD    Physical Exam: Vitals:   10/31/16 0923 10/31/16 0930 10/31/16 1200 10/31/16 1310  BP: (!) 144/65 139/63 140/66 (!) 145/78  Pulse: 87 81 69 81  Resp: 18  16 15   Temp:      SpO2: 98% 97% 100% 100%      Constitutional: NAD, calm, comfortable Vitals:   10/31/16 0923 10/31/16 0930 10/31/16 1200 10/31/16 1310  BP: (!) 144/65 139/63 140/66 (!) 145/78  Pulse: 87 81 69 81  Resp: 18  16 15   Temp:      SpO2: 98% 97% 100% 100%   Eyes: PERRL, lids and conjunctivae normal ENMT: Mucous membranes are moist. Posterior pharynx clear of any exudate  or lesions.Normal dentition.  Neck: normal, supple, no masses, no thyromegaly Respiratory:   mildly diminished breath sounds with few occasional bilateral rhonchorous breath sounds otherwise unremarkable.  Normal respiratory effort. No accessory muscle use.  Cardiovascular: Regular rate and rhythm, no murmurs / rubs / gallops. No extremity edema. 2+ pedal pulses. No carotid bruits.  Abdomen: no tenderness, no masses palpated. No hepatosplenomegaly. Bowel sounds positive.  Musculoskeletal: no clubbing / cyanosis.  Diffuse tenderness of left upper extremity mostly involving the left third of the left forearm and wrist with swelling of the wrist, mildly warm to touch without any obvious erythema or skin changes otherwise Skin: no rashes Neurologic:  alert oriented 3 Psychiatric: flat affect    ( Labs on Admission: I have personally reviewed following labs and imaging studies  CBC:  Recent Labs Lab 10/31/16 0810  WBC 11.8*  NEUTROABS 6.4  HGB 10.2*  HCT 33.4*  MCV 83.5  PLT 191*   Basic Metabolic Panel:  Recent Labs Lab 10/31/16 0810  NA 138  K 3.8  CL 102  CO2 25  GLUCOSE 125*  BUN 72*  CREATININE 1.39*  CALCIUM 9.8   GFR: Estimated Creatinine Clearance: 55.7 mL/min (A) (by C-G formula based on SCr of 1.39 mg/dL  (H)). Liver Function Tests:  Recent Labs Lab 10/31/16 0810  AST 22  ALT 13*  ALKPHOS 112  BILITOT 0.6  PROT 6.4*  ALBUMIN 3.0*   No results for input(s): LIPASE, AMYLASE in the last 168 hours. No results for input(s): AMMONIA in the last 168 hours. Coagulation Profile: No results for input(s): INR, PROTIME in the last 168 hours. Cardiac Enzymes: No results for input(s): CKTOTAL, CKMB, CKMBINDEX, TROPONINI in the last 168 hours. BNP (last 3 results) No results for input(s): PROBNP in the last 8760 hours. HbA1C: No results for input(s): HGBA1C in the last 72 hours. CBG: No results for input(s): GLUCAP in the last 168 hours. Lipid Profile: No results for input(s): CHOL, HDL, LDLCALC, TRIG, CHOLHDL, LDLDIRECT in the last 72 hours. Thyroid Function Tests: No results for input(s): TSH, T4TOTAL, FREET4, T3FREE, THYROIDAB in the last 72 hours. Anemia Panel: No results for input(s): VITAMINB12, FOLATE, FERRITIN, TIBC, IRON, RETICCTPCT in the last 72 hours. Urine analysis:    Component Value Date/Time   COLORURINE YELLOW 10/08/2016 0419   APPEARANCEUR CLEAR 10/08/2016 0419   LABSPEC 1.011 10/08/2016 0419   PHURINE 6.0 10/08/2016 0419   GLUCOSEU NEGATIVE 10/08/2016 0419   GLUCOSEU NEGATIVE 05/02/2015 1008   HGBUR NEGATIVE 10/08/2016 0419   BILIRUBINUR NEGATIVE 10/08/2016 0419   KETONESUR NEGATIVE 10/08/2016 0419   PROTEINUR >=300 (A) 10/08/2016 0419   UROBILINOGEN 0.2 05/02/2015 1008   NITRITE NEGATIVE 10/08/2016 0419   LEUKOCYTESUR LARGE (A) 10/08/2016 0419   Sepsis Labs: @LABRCNTIP (procalcitonin:4,lacticidven:4) )No results found for this or any previous visit (from the past 240 hour(s)).   Radiological Exams on Admission: Dg Chest 2 View  Result Date: 10/31/2016 CLINICAL DATA:  Arm pain. EXAM: CHEST  2 VIEW COMPARISON:  10/15/2016. FINDINGS: There is cardiac enlargement. Decreased lung volumes. Diffuse bilateral interstitial and airspace opacities are identified which  may reflect pulmonary edema or multifocal infection. Diffuse abnormal sclerosis of the visualized osseous structures again noted. IMPRESSION: 1. Bilateral interstitial and airspace opacities which may reflect pulmonary edema and/or infection. 2. Diffuse osteosclerosis noted. Electronically Signed   By: Kerby Moors M.D.   On: 10/31/2016 09:09   Dg Forearm Left  Result Date: 10/30/2016 CLINICAL DATA:  Arm pain beginning today. EXAM:  LEFT FOREARM - 2 VIEW COMPARISON:  None. FINDINGS: Diffuse sclerotic and permeative appearance of the visualized skeleta, present elsewhere in the skeleton by chest CT since at least 2015. No superimposed fracture or malalignment. No suspected elbow joint effusion. IMPRESSION: 1. No acute finding. 2. Chronic sclerotic and permeative appearance of the skeleton in this patient with history of myeloproliferative disease. Electronically Signed   By: Monte Fantasia M.D.   On: 10/30/2016 17:22   Dg Humerus Left  Result Date: 10/30/2016 CLINICAL DATA:  Left humerus pain beginning this morning. No specific injury. EXAM: LEFT HUMERUS - 2+ VIEW COMPARISON:  None. FINDINGS: Mottled sclerotic bones diffusely correlating with history of myeloproliferative disease. These changes have been seen elsewhere in the skeleton since at least 2015 chest CT. No acute fracture or focal bony erosion. No acute soft tissue finding. There is question of 13 mm nodule over the left mid lung, but none seen on dedicated chest x-ray from 2 weeks prior. IMPRESSION: 1. No acute finding. 2. Chronic sclerotic and permeative appearance of the skeleton correlating with history of myeloproliferative disease. Electronically Signed   By: Monte Fantasia M.D.   On: 10/30/2016 17:21   Dg Hand Complete Left  Result Date: 10/30/2016 CLINICAL DATA:  Generalized left upper extremity pain. EXAM: LEFT HAND - COMPLETE 3+ VIEW COMPARISON:  None. FINDINGS: Diffuse osteopenia. No evidence of fracture or dislocation.  Osteoarthritic changes at the radiocarpal joint, intercarpal joints and first carpometacarpal joint. IMPRESSION: No acute fracture or dislocation identified about the left head. Multilevel osteoarthritic changes. Electronically Signed   By: Fidela Salisbury M.D.   On: 10/30/2016 17:23    EKG: n/a  Assessment/Plan Principal Problem:   Recurrent pneumonia Active Problems:   Acute extremity pain    #1 left wrist pain and swelling:   suspect acute gout Doubt septic arthritis-follow up on MRI ordered by EDP Pain support Uric acid level check as outpatient after resolution of acute pain  #2 pneumonia: Abx O2 supp Sputum Cx Bld Cx Check 12-lead EKG Check BNP to r/o incipent CHF and consider 2D echo as needed  DVT prophylaxis: (Lovenox) Code Status: (Full) Family Communication:  Disposition Plan:(To be determined) Consults called:  Admission status: ( obs / tele)   OSEI-BONSU,Lielle Vandervort MD [facility] Pager [phone]  If 7PM-7AM, please contact night-coverage [email or web address] Password [email or web address]  10/31/2016, 2:50 PM

## 2016-10-31 NOTE — Progress Notes (Signed)
Unable to administer daily mediation d/t not verified by pharmacy. Per Ronalee Belts in pharmacy they can not verify with out MD approval since patient not sure of what medications he takes.   MD notified.

## 2016-10-31 NOTE — ED Notes (Signed)
Pt taken to Xray.

## 2016-10-31 NOTE — ED Triage Notes (Signed)
Per EMS: Pt states seen here today for L arm pain. Pt states hurt arm while getting xray. Pt in splint upon arrival. Pt on 2L O2 at home.

## 2016-11-01 DIAGNOSIS — M7502 Adhesive capsulitis of left shoulder: Secondary | ICD-10-CM | POA: Diagnosis present

## 2016-11-01 DIAGNOSIS — I2510 Atherosclerotic heart disease of native coronary artery without angina pectoris: Secondary | ICD-10-CM | POA: Diagnosis present

## 2016-11-01 DIAGNOSIS — M1990 Unspecified osteoarthritis, unspecified site: Secondary | ICD-10-CM | POA: Diagnosis not present

## 2016-11-01 DIAGNOSIS — Y95 Nosocomial condition: Secondary | ICD-10-CM | POA: Diagnosis present

## 2016-11-01 DIAGNOSIS — M109 Gout, unspecified: Secondary | ICD-10-CM | POA: Diagnosis present

## 2016-11-01 DIAGNOSIS — I712 Thoracic aortic aneurysm, without rupture: Secondary | ICD-10-CM | POA: Diagnosis present

## 2016-11-01 DIAGNOSIS — J189 Pneumonia, unspecified organism: Secondary | ICD-10-CM | POA: Diagnosis not present

## 2016-11-01 DIAGNOSIS — Z87891 Personal history of nicotine dependence: Secondary | ICD-10-CM | POA: Diagnosis not present

## 2016-11-01 DIAGNOSIS — I130 Hypertensive heart and chronic kidney disease with heart failure and stage 1 through stage 4 chronic kidney disease, or unspecified chronic kidney disease: Secondary | ICD-10-CM | POA: Diagnosis present

## 2016-11-01 DIAGNOSIS — Z9981 Dependence on supplemental oxygen: Secondary | ICD-10-CM | POA: Diagnosis not present

## 2016-11-01 DIAGNOSIS — R05 Cough: Secondary | ICD-10-CM | POA: Diagnosis not present

## 2016-11-01 DIAGNOSIS — N183 Chronic kidney disease, stage 3 (moderate): Secondary | ICD-10-CM | POA: Diagnosis present

## 2016-11-01 DIAGNOSIS — R0602 Shortness of breath: Secondary | ICD-10-CM | POA: Diagnosis not present

## 2016-11-01 DIAGNOSIS — E875 Hyperkalemia: Secondary | ICD-10-CM | POA: Diagnosis present

## 2016-11-01 DIAGNOSIS — C946 Myelodysplastic disease, not classified: Secondary | ICD-10-CM | POA: Diagnosis present

## 2016-11-01 DIAGNOSIS — J309 Allergic rhinitis, unspecified: Secondary | ICD-10-CM | POA: Diagnosis present

## 2016-11-01 DIAGNOSIS — M79609 Pain in unspecified limb: Secondary | ICD-10-CM | POA: Diagnosis not present

## 2016-11-01 DIAGNOSIS — Z8701 Personal history of pneumonia (recurrent): Secondary | ICD-10-CM | POA: Diagnosis not present

## 2016-11-01 DIAGNOSIS — M7989 Other specified soft tissue disorders: Secondary | ICD-10-CM | POA: Diagnosis not present

## 2016-11-01 DIAGNOSIS — M24132 Other articular cartilage disorders, left wrist: Secondary | ICD-10-CM | POA: Diagnosis not present

## 2016-11-01 DIAGNOSIS — M25432 Effusion, left wrist: Secondary | ICD-10-CM | POA: Diagnosis present

## 2016-11-01 DIAGNOSIS — Z7982 Long term (current) use of aspirin: Secondary | ICD-10-CM | POA: Diagnosis not present

## 2016-11-01 DIAGNOSIS — I5040 Unspecified combined systolic (congestive) and diastolic (congestive) heart failure: Secondary | ICD-10-CM | POA: Diagnosis not present

## 2016-11-01 DIAGNOSIS — D731 Hypersplenism: Secondary | ICD-10-CM | POA: Diagnosis present

## 2016-11-01 DIAGNOSIS — I5032 Chronic diastolic (congestive) heart failure: Secondary | ICD-10-CM | POA: Diagnosis present

## 2016-11-01 DIAGNOSIS — Z79899 Other long term (current) drug therapy: Secondary | ICD-10-CM | POA: Diagnosis not present

## 2016-11-01 DIAGNOSIS — E1122 Type 2 diabetes mellitus with diabetic chronic kidney disease: Secondary | ICD-10-CM | POA: Diagnosis present

## 2016-11-01 DIAGNOSIS — G4733 Obstructive sleep apnea (adult) (pediatric): Secondary | ICD-10-CM | POA: Diagnosis present

## 2016-11-01 LAB — TROPONIN I: Troponin I: <0.03 ng/mL (ref <0.03)

## 2016-11-01 LAB — BASIC METABOLIC PANEL
ANION GAP: 7 (ref 5–15)
BUN: 70 mg/dL — ABNORMAL HIGH (ref 6–20)
CALCIUM: 9.1 mg/dL (ref 8.9–10.3)
CO2: 28 mmol/L (ref 22–32)
Chloride: 105 mmol/L (ref 101–111)
Creatinine, Ser: 1.42 mg/dL — ABNORMAL HIGH (ref 0.61–1.24)
GFR calc non Af Amer: 47 mL/min — ABNORMAL LOW (ref >60)
GFR, EST AFRICAN AMERICAN: 55 mL/min — AB (ref >60)
GLUCOSE: 100 mg/dL — AB (ref 65–99)
Potassium: 4.2 mmol/L (ref 3.5–5.1)
Sodium: 140 mmol/L (ref 135–145)

## 2016-11-01 LAB — CBC
HCT: 27.9 % — ABNORMAL LOW (ref 39.0–52.0)
Hemoglobin: 8.5 g/dL — ABNORMAL LOW (ref 13.0–17.0)
MCH: 25.1 pg — ABNORMAL LOW (ref 26.0–34.0)
MCHC: 30.5 g/dL (ref 30.0–36.0)
MCV: 82.5 fL (ref 78.0–100.0)
PLATELETS: 362 10*3/uL (ref 150–400)
RBC: 3.38 MIL/uL — ABNORMAL LOW (ref 4.22–5.81)
RDW: 18.3 % — AB (ref 11.5–15.5)
WBC: 6.7 10*3/uL (ref 4.0–10.5)

## 2016-11-01 LAB — HIV ANTIBODY (ROUTINE TESTING W REFLEX): HIV SCREEN 4TH GENERATION: NONREACTIVE

## 2016-11-01 LAB — GLUCOSE, CAPILLARY: Glucose-Capillary: 159 mg/dL — ABNORMAL HIGH (ref 65–99)

## 2016-11-01 LAB — STREP PNEUMONIAE URINARY ANTIGEN: Strep Pneumo Urinary Antigen: NEGATIVE

## 2016-11-01 MED ORDER — INSULIN ASPART 100 UNIT/ML ~~LOC~~ SOLN: 0.0000 [IU] | Freq: Every day | SUBCUTANEOUS | Status: DC

## 2016-11-01 MED ORDER — COLCHICINE 0.6 MG PO TABS
0.6000 mg | ORAL_TABLET | Freq: Every day | ORAL | Status: DC
  Administered 2016-11-01 – 2016-11-04 (×5): 0.6 mg via ORAL
  Filled 2016-11-01 (×6): qty 1

## 2016-11-01 MED ORDER — METHYLPREDNISOLONE SODIUM SUCC 125 MG IJ SOLR
60.0000 mg | Freq: Two times a day (BID) | INTRAMUSCULAR | Status: DC
  Administered 2016-11-01 (×2): 60 mg via INTRAVENOUS
  Administered 2016-11-02: 09:00:00 via INTRAVENOUS
  Administered 2016-11-02: 60 mg via INTRAVENOUS
  Filled 2016-11-01 (×4): qty 2

## 2016-11-01 MED ORDER — INSULIN ASPART 100 UNIT/ML ~~LOC~~ SOLN
0.0000 [IU] | Freq: Three times a day (TID) | SUBCUTANEOUS | Status: DC
  Administered 2016-11-03 (×2): 2 [IU] via SUBCUTANEOUS

## 2016-11-01 NOTE — Progress Notes (Signed)
_0         PROGRESS NOTE                                                                                                                                                                                                             Patient Demographics:    Juan [name], is a 76 y.o. male, DOB - [birth year], YTK:[id number]  Admit date - 10/31/2016   Admitting Physician Benito Mccreedy, MD  Outpatient Primary MD for the patient is Colon Branch, MD  LOS - 0  Chief Complaint  Patient presents with  . Arm Pain       Brief Narrative   Juan [name] is a 76 y.o. male with medical history significant for but not limited to myeloproliferative disorder presenting with several days history of progressively worsening severe left wrist aching pain radiating to ipsilateral lower forearm associated with swelling of the left wrist, unable to touch it, not relieved by home remedy. Patient was admitted for acute CHF and left upper extremity pain which was evaluated in consultation by [facility] orthopedics in May 2018, but this was mainly centered around his left upper arm/shoulder with no remarkable findings.   Subjective:    Juan [name] today has, No headache, No chest pain, No abdominal pain - No Nausea, No new weakness tingling or numbness, No Cough - SOB.  ++ L wrist pain.   Assessment  & Plan :     1.Left wrist acute gouty arthritis. Uric acid 11.3, placed on IV Solu-Medrol and colchicine, discussed the case with hand surgeon Dr. Apolonio Schneiders and orthopedic surgeon Dr. Alvan Dame they have nothing to offer, hand surgery requests that I get IR involved for joint aspiration, this has been ordered we'll monitor fluid results. Continue pain control. Will order splint as well.  2. Chronic diastolic CHF. EF 60%. Patient is compensated. Continue home dose Imdur, diuretic and Coreg combination. Continue to monitor.  3. Essential hypertension. Stable on combination of diuretic, Coreg and hydralazine along with  Imdur.  4. Left frozen shoulder. Outpatient age-appropriate workup.  5. Agnogenic myeloid metaplasia  - Has seen Dr. Marin Olp in the past (consult note 05/07/16 reviewed). Apparently he has refused a bone marrow biopsy and does not want any treatment for this.  6. CKD 3. Baseline creatinine around 1.7. Close to baseline continue to monitor.  7. Pneumonia. Lack of symptoms, currently on Rocephin and azithromycin, if remains stable and cultures negative will top antibiotics within 1-2 days.  8. DM type II. On diet control, since he is  on Solu-Medrol will add sliding scale. Recent A1c was 5.4.    Diet : Diet Heart Room service appropriate? Yes; Fluid consistency: Thin   Family Communication  :  None  Code Status :  Full  Disposition Plan  :  TBD  Consults  :    Procedures  :    Left wrist ultrasound-guided aspiration requested through IR.  DVT Prophylaxis  :    Lab Results  Component Value Date   PLT 362 11/01/2016    Inpatient Medications  Scheduled Meds: . aspirin  325 mg Oral Daily  . B-complex with vitamin C  1 tablet Oral Daily  . carvedilol  3.125 mg Oral BID WC  . colchicine  0.6 mg Oral Daily  . feeding supplement (GLUCERNA SHAKE)  118.5 mL Oral Daily  . ferrous gluconate  324 mg Oral QHS  . fluticasone  2 spray Each Nare Daily  . heparin  5,000 Units Subcutaneous Q8H  . hydrALAZINE  50 mg Oral TID  . isosorbide mononitrate  30 mg Oral BID  . lidocaine   Topical TID  . mouth rinse  15 mL Mouth Rinse BID  . methocarbamol  750 mg Oral TID  . methylPREDNISolone (SOLU-MEDROL) injection  60 mg Intravenous Q12H  . potassium chloride SA  20 mEq Oral BID  . torsemide  80 mg Oral Daily   Continuous Infusions: . ceFEPime (MAXIPIME) IV Stopped (11/01/16 0817)  . vancomycin Stopped (11/01/16 0245)   PRN Meds:.acetaminophen, bisacodyl, docusate sodium, gabapentin, HYDROcodone-acetaminophen, ketorolac, simethicone  Antibiotics  :    Anti-infectives    Start      Dose/Rate Route Frequency Ordered Stop   11/01/16 0230  vancomycin (VANCOCIN) IVPB 750 mg/150 ml premix     750 mg 150 mL/hr over 60 Minutes Intravenous Every 12 hours 10/31/16 1708     10/31/16 2330  ceFEPIme (MAXIPIME) 1 g in dextrose 5 % 50 mL IVPB     1 g 100 mL/hr over 30 Minutes Intravenous Every 8 hours 10/31/16 1657 11/08/16 2329   10/31/16 1500  ceFEPIme (MAXIPIME) 1 g in dextrose 5 % 50 mL IVPB     1 g 100 mL/hr over 30 Minutes Intravenous  Once 10/31/16 1345 10/31/16 1607   10/31/16 1400  vancomycin (VANCOCIN) IVPB 1000 mg/200 mL premix     1,000 mg 200 mL/hr over 60 Minutes Intravenous  Once 10/31/16 1345 10/31/16 1530         Objective:   Vitals:   10/31/16 1732 10/31/16 2052 11/01/16 0500 11/01/16 0903  BP:  133/60 135/61 (!) 145/65  Pulse:  79 85 88  Resp:  17  20  Temp:  98.4 F (36.9 C) 98.6 F (37 C) 97.4 F (36.3 C)  TempSrc:  Oral Oral Oral  SpO2:  98% 97% 97%  Weight:  89.2 kg (196 lb 9.6 oz) 89.8 kg (198 lb)   Height: _0  (1.905 m)       Wt Readings from Last 3 Encounters:  11/01/16 89.8 kg (198 lb)  10/30/16 88 kg (194 lb)  10/27/16 88 kg (194 lb)     Intake/Output Summary (Last 24 hours) at 11/01/16 1154 Last data filed at 11/01/16 0845  Gross per 24 hour  Intake              610 ml  Output             1000 ml  Net             -  390 ml     Physical Exam  Awake Alert, Oriented X 3, No new F.N deficits, Normal affect [place].AT,PERRAL Supple Neck,No JVD, No cervical lymphadenopathy appriciated.  Symmetrical Chest wall movement, Good air movement bilaterally, CTAB RRR,No Gallops,Rubs or new Murmurs, No Parasternal Heave +ve B.Sounds, Abd Soft, No tenderness, No organomegaly appriciated, No rebound - guarding or rigidity. No Cyanosis, Clubbing or edema, No new Rash or bruise  He has chronic left frozen shoulder with limited mobility, left wrist has significant effusion and edema, extremely tender to palpate, minimal warmth around it     Data Review:    CBC  Recent Labs Lab 10/31/16 0810 11/01/16 0638  WBC 11.8* 6.7  HGB 10.2* 8.5*  HCT 33.4* 27.9*  PLT 582* 362  MCV 83.5 82.5  MCH 25.5* 25.1*  MCHC 30.5 30.5  RDW 19.0* 18.3*  LYMPHSABS 1.9  --   MONOABS 3.0*  --   EOSABS 0.0  --   BASOSABS 0.5*  --     Chemistries   Recent Labs Lab 10/31/16 0810 11/01/16 0638  NA 138 140  K 3.8 4.2  CL 102 105  CO2 25 28  GLUCOSE 125* 100*  BUN 72* 70*  CREATININE 1.39* 1.42*  CALCIUM 9.8 9.1  AST 22  --   ALT 13*  --   ALKPHOS 112  --   BILITOT 0.6  --    ------------------------------------------------------------------------------------------------------------------ No results for input(s): CHOL, HDL, LDLCALC, TRIG, CHOLHDL, LDLDIRECT in the last 72 hours.  Lab Results  Component Value Date   HGBA1C 5.8 07/28/2016   ------------------------------------------------------------------------------------------------------------------ No results for input(s): TSH, T4TOTAL, T3FREE, THYROIDAB in the last 72 hours.  Invalid input(s): FREET3 ------------------------------------------------------------------------------------------------------------------ No results for input(s): VITAMINB12, FOLATE, FERRITIN, TIBC, IRON, RETICCTPCT in the last 72 hours.  Coagulation profile No results for input(s): INR, PROTIME in the last 168 hours.  No results for input(s): DDIMER in the last 72 hours.  Cardiac Enzymes  Recent Labs Lab 10/31/16 1841 11/01/16 0046 11/01/16 0638  TROPONINI 0.03* <0.03 <0.03   ------------------------------------------------------------------------------------------------------------------    Component Value Date/Time   BNP 389.5 (H) 10/31/2016 1605    Micro Results Recent Results (from the past 240 hour(s))  Culture, blood (routine x 2)     Status: None (Preliminary result)   Collection Time: 10/31/16  8:00 AM  Result Value Ref Range Status   Specimen Description BLOOD RIGHT  ANTECUBITAL  Final   Special Requests   Final    BOTTLES DRAWN AEROBIC AND ANAEROBIC Blood Culture adequate volume   Culture NO GROWTH 1 DAY  Final   Report Status PENDING  Incomplete  Culture, blood (routine x 2)     Status: None (Preliminary result)   Collection Time: 10/31/16  8:10 AM  Result Value Ref Range Status   Specimen Description BLOOD RIGHT HAND  Final   Special Requests   Final    BOTTLES DRAWN AEROBIC ONLY Blood Culture adequate volume   Culture NO GROWTH 1 DAY  Final   Report Status PENDING  Incomplete    Radiology Reports Dg Chest 2 View  Result Date: 10/31/2016 CLINICAL DATA:  Arm pain. EXAM: CHEST  2 VIEW COMPARISON:  10/15/2016. FINDINGS: There is cardiac enlargement. Decreased lung volumes. Diffuse bilateral interstitial and airspace opacities are identified which may reflect pulmonary edema or multifocal infection. Diffuse abnormal sclerosis of the visualized osseous structures again noted. IMPRESSION: 1. Bilateral interstitial and airspace opacities which may reflect pulmonary edema and/or infection. 2. Diffuse osteosclerosis noted. Electronically Signed  By: Kerby Moors M.D.   On: 10/31/2016 09:09   Dg Chest 2 View  Result Date: 10/15/2016 CLINICAL DATA:  Follow-up pneumonia. Current history of CHF, stage 3 chronic kidney disease, myeloid metaplasia/myeloproliferative disease. EXAM: CHEST  2 VIEW COMPARISON:  10/12/2016, 10/08/2016 and earlier including CT chest 04/01/2016. FINDINGS: Cardiac silhouette markedly enlarged, unchanged. Pulmonary venous hypertension without evidence of pulmonary edema currently. Improved aeration in the lower lobes since the examination 3 days ago with only mild right lower lobe opacity persisting. No new pulmonary parenchymal abnormalities. Diffuse osteosclerosis as noted previously. IMPRESSION: Improved bilateral pneumonia since the examination 3 days ago with only minimal right lower lobe opacity persisting. No new abnormalities. Stable  cardiomegaly without pulmonary edema currently. Electronically Signed   By: Evangeline Dakin M.D.   On: 10/15/2016 20:04   Dg Chest 2 View  Result Date: 10/08/2016 CLINICAL DATA:  Fever, cough, shortness of breath, numbness down the left arm. EXAM: CHEST  2 VIEW COMPARISON:  05/05/2016 FINDINGS: Shallow inspiration with atelectasis in the lung bases. Cardiac enlargement with mild pulmonary vascular congestion. Hazy perihilar opacities may represent mild pulmonary edema. No pleural effusions. No pneumothorax. Mild thoracic scoliosis convex towards the right. IMPRESSION: Cardiac enlargement with mild pulmonary vascular congestion and probable mild perihilar edema. Shallow inspiration with atelectasis in the lung bases. Electronically Signed   By: Lucienne Capers M.D.   On: 10/08/2016 04:56   Dg Forearm Left  Result Date: 10/30/2016 CLINICAL DATA:  Arm pain beginning today. EXAM: LEFT FOREARM - 2 VIEW COMPARISON:  None. FINDINGS: Diffuse sclerotic and permeative appearance of the visualized skeleta, present elsewhere in the skeleton by chest CT since at least 2015. No superimposed fracture or malalignment. No suspected elbow joint effusion. IMPRESSION: 1. No acute finding. 2. Chronic sclerotic and permeative appearance of the skeleton in this patient with history of myeloproliferative disease. Electronically Signed   By: Monte Fantasia M.D.   On: 10/30/2016 17:22   Mr Cervical Spine Wo Contrast  Result Date: 10/09/2016 CLINICAL DATA:  Myeloproliferative disorder with left arm pain. EXAM: MRI CERVICAL SPINE WITHOUT CONTRAST TECHNIQUE: Multiplanar, multisequence MR imaging of the cervical spine was performed. No intravenous contrast was administered. COMPARISON:  1. Bone scan 04/17/2016 2. Chest CT 04/01/2016 FINDINGS: Alignment: Normal Vertebrae: There is low T1 weighted signal throughout the bone marrow with heterogeneous areas of high T2 weighted signal. No discrete focal marrow lesion. Cord: Normal  Posterior Fossa, vertebral arteries, paraspinal tissues: Visualized posterior fossa is normal. Vertebral artery flow voids are preserved. Normal visualized paraspinal soft tissues. Disc levels: C1-C2: Normal. C2-C3: Disc desiccation without significant bulge.  No stenosis. C3-C4: Disc space narrowing with mild uncovertebral hypertrophy. No stenosis. C4-C5: Mild bilateral uncovertebral and facet hypertrophy without associated stenosis. C5-C6: Disc desiccation with small bulge narrowing the ventral thecal sac. Mild bilateral uncovertebral hypertrophy. No stenosis. C6-C7: Disc desiccation with narrowing of the disc space and type 2 Modic degenerative endplate changes. C7-T1: Normal disc space and facets. No spinal canal or neuroforaminal stenosis. Within the medial aspect of the left T2 transverse process, there is a hyperintense T2 weighted signal lesion measuring 8 mm, seen on coronal image 5. IMPRESSION: 1. 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 No specific finding to explain the left arm pain. 2. Diffuse heterogeneous bone marrow signal which is compatible with the patient's known myeloproliferative disorder. 3. Focal T2 hyperintense lesion in the left transverse process of T2, seen only on the coronal  sequence and thus incompletely evaluated, but unchanged compared to the chest CT of 04/01/2016. Electronically Signed   By: Ulyses Jarred M.D.   On: 10/09/2016 14:00   Mr Shoulder Left W Wo Contrast  Result Date: 10/14/2016 CLINICAL DATA:  Left all arm and shoulder pain scratch the left arm and shoulder pain with weakness. History of myeloproliferative disorder. Fever. EXAM: MRI OF THE LEFT SHOULDER WITHOUT AND WITH CONTRAST TECHNIQUE: Multiplanar, multisequence MR imaging of the left shoulder was performed before and after the administration of intravenous contrast. CONTRAST:  8 ml MULTIHANCE GADOBENATE DIMEGLUMINE 529 MG/ML IV SOLN  COMPARISON:  Plain films left shoulder 10/09/2016. FINDINGS: Patient motion degrades the exam. Rotator cuff: The patient has supraspinatus and infraspinatus tendinopathy. There is a partial width, full-thickness tear of the posterior fibers of the supraspinatus measuring approximately 0.7 cm from front to back with retraction of 1-2 cm. The rotator cuff is otherwise intact. Muscles:  No focal atrophy or lesion. Biceps long head:  Intact. Acromioclavicular Joint: Mild degenerative change is seen. Type 1 acromion. No subacromial/subdeltoid bursal fluid. Glenohumeral Joint: Negative. Labrum:  Grossly intact. Bones: Abnormal marrow signal is present in all imaged bones. No fracture is identified. No evidence of osteomyelitis. Other: None. IMPRESSION: Abnormal marrow signal in all imaged bones consistent with the patient's history of myeloproliferative disorder, multiple myeloma or less likely metastatic disease. Negative for evidence of infectious process. Rotator cuff tendinopathy with a 0.7 cm full-thickness tear of the posterior supraspinatus with mild retraction and no atrophy. Electronically Signed   By: Inge Rise M.D.   On: 10/14/2016 09:28   Nm Pulmonary Perf And Vent  Result Date: 10/15/2016 CLINICAL DATA:  Short of breath. Concern pulmonary embolism. Pulmonary hypertension. EXAM: NUCLEAR MEDICINE VENTILATION - PERFUSION LUNG SCAN TECHNIQUE: Ventilation images were obtained in multiple projections using inhaled aerosol [id number]. Perfusion images were obtained in multiple projections after intravenous injection of Tc-983mAA. LAO/RPO Oblique projections not performed. Patient became nauseous and vomited. RADIOPHARMACEUTICALS:  30.0 mCi Technetium-9941mPA aerosol inhalation and 4.0 mCi Technetium-81m16m IV COMPARISON:  Chest radiograph 10/15/2016 FINDINGS: Ventilation: No focal ventilation defect. Elevation RIGHT hemidiaphragm Perfusion: No wedge shaped peripheral perfusion defects to suggest acute  pulmonary embolism. IMPRESSION: No evidence acute or chronic pulmonary embolism. Electronically Signed   By: StewSuzy Bouchard.   On: 10/15/2016 17:29   Dg Chest Port 1 View  Result Date: 10/12/2016 CLINICAL DATA:  75 y63r old male with history of fever. EXAM: PORTABLE CHEST 1 VIEW COMPARISON:  Chest x-ray a 10/08/2016. FINDINGS: Lung volumes are low. Opacity multifocal interstitial and airspace disease, most confluent in the left lower lobe. No definite pleural effusions. Heart size is borderline enlarged, likely accentuated by low lung volumes. Pulmonary vasculature does not appear engorged. Upper mediastinal contours are distorted by patient positioning. IMPRESSION: 1. Patchy asymmetrically distributed multifocal interstitial and airspace disease most confluent in the left lower lobe, concerning for multilobar bronchopneumonia. Electronically Signed   By: DaniVinnie Langton.   On: 10/12/2016 10:45   Dg Shoulder Left  Result Date: 10/09/2016 CLINICAL DATA:  Left neck pain.  Arm pain.  Left hand swelling . EXAM: LEFT SHOULDER - 2+ VIEW COMPARISON:  Chest x-ray 10/08/2016 FINDINGS: No acute bony or joint abnormality identified. No evidence of fracture or dislocation. IMPRESSION: No acute or focal abnormality. Electronically Signed   By: ThomMarcello Mooresgister   On: 10/09/2016 13:42   Dg Humerus Left  Result Date: 10/30/2016 CLINICAL DATA:  Left humerus pain beginning this  morning. No specific injury. EXAM: LEFT HUMERUS - 2+ VIEW COMPARISON:  None. FINDINGS: Mottled sclerotic bones diffusely correlating with history of myeloproliferative disease. These changes have been seen elsewhere in the skeleton since at least 2015 chest CT. No acute fracture or focal bony erosion. No acute soft tissue finding. There is question of 13 mm nodule over the left mid lung, but none seen on dedicated chest x-ray from 2 weeks prior. IMPRESSION: 1. No acute finding. 2. Chronic sclerotic and permeative appearance of the  skeleton correlating with history of myeloproliferative disease. Electronically Signed   By: Monte Fantasia M.D.   On: 10/30/2016 17:21   Dg Hand Complete Left  Result Date: 10/30/2016 CLINICAL DATA:  Generalized left upper extremity pain. EXAM: LEFT HAND - COMPLETE 3+ VIEW COMPARISON:  None. FINDINGS: Diffuse osteopenia. No evidence of fracture or dislocation. Osteoarthritic changes at the radiocarpal joint, intercarpal joints and first carpometacarpal joint. IMPRESSION: No acute fracture or dislocation identified about the left head. Multilevel osteoarthritic changes. Electronically Signed   By: Fidela Salisbury M.D.   On: 10/30/2016 17:23    Time Spent in minutes  30   Lala Lund M.D on 11/01/2016 at 11:54 AM  Between 7am to 7pm - Pager - [phone] ( page via [email or web address], text pages only, please mention full 10 digit call back number). After 7pm go to [email or web address] - password [email or web address]

## 2016-11-02 ENCOUNTER — Inpatient Hospital Stay (HOSPITAL_COMMUNITY): Payer: Medicare Other

## 2016-11-02 ENCOUNTER — Telehealth: Payer: Self-pay | Admitting: *Deleted

## 2016-11-02 LAB — BASIC METABOLIC PANEL
ANION GAP: 11 (ref 5–15)
BUN: 81 mg/dL — ABNORMAL HIGH (ref 6–20)
CALCIUM: 9.3 mg/dL (ref 8.9–10.3)
CO2: 22 mmol/L (ref 22–32)
Chloride: 102 mmol/L (ref 101–111)
Creatinine, Ser: 1.67 mg/dL — ABNORMAL HIGH (ref 0.61–1.24)
GFR calc non Af Amer: 39 mL/min — ABNORMAL LOW (ref >60)
GFR, EST AFRICAN AMERICAN: 45 mL/min — AB (ref >60)
Glucose, Bld: 200 mg/dL — ABNORMAL HIGH (ref 65–99)
POTASSIUM: 5 mmol/L (ref 3.5–5.1)
Sodium: 135 mmol/L (ref 135–145)

## 2016-11-02 LAB — SYNOVIAL CELL COUNT + DIFF, W/ CRYSTALS
Crystals, Fluid: NONE SEEN
EOSINOPHILS-SYNOVIAL: 0 % (ref 0–1)
LYMPHOCYTES-SYNOVIAL FLD: 2 % (ref 0–20)
MONOCYTE-MACROPHAGE-SYNOVIAL FLUID: 8 % — AB (ref 50–90)
Neutrophil, Synovial: 90 % — ABNORMAL HIGH (ref 0–25)
WBC, Synovial: 488 /mm3 — ABNORMAL HIGH (ref 0–200)

## 2016-11-02 LAB — CBC
HEMATOCRIT: 31.3 % — AB (ref 39.0–52.0)
HEMOGLOBIN: 9.6 g/dL — AB (ref 13.0–17.0)
MCH: 25.7 pg — ABNORMAL LOW (ref 26.0–34.0)
MCHC: 30.7 g/dL (ref 30.0–36.0)
MCV: 83.7 fL (ref 78.0–100.0)
Platelets: 447 10*3/uL — ABNORMAL HIGH (ref 150–400)
RBC: 3.74 MIL/uL — AB (ref 4.22–5.81)
RDW: 18.8 % — AB (ref 11.5–15.5)
WBC: 9.4 10*3/uL (ref 4.0–10.5)

## 2016-11-02 LAB — GLUCOSE, CAPILLARY
GLUCOSE-CAPILLARY: 166 mg/dL — AB (ref 65–99)
GLUCOSE-CAPILLARY: 140 mg/dL — AB (ref 65–99)
Glucose-Capillary: 173 mg/dL — ABNORMAL HIGH (ref 65–99)
Glucose-Capillary: 165 mg/dL — ABNORMAL HIGH (ref 65–99)

## 2016-11-02 LAB — RPR: RPR: NONREACTIVE

## 2016-11-02 MED ORDER — SODIUM CHLORIDE 0.9 % IJ SOLN
INTRAMUSCULAR | Status: AC
  Administered 2016-11-02: 3 mL via INTRAVENOUS
  Filled 2016-11-02: qty 10

## 2016-11-02 MED ORDER — TORSEMIDE 20 MG PO TABS: 60.0000 mg | ORAL_TABLET | Freq: Every day | ORAL | Status: DC

## 2016-11-02 MED ORDER — SODIUM CHLORIDE 0.9 % IJ SOLN
3.0000 mL | Freq: Once | INTRAMUSCULAR | Status: AC
  Administered 2016-11-02: 3 mL via INTRAVENOUS

## 2016-11-02 MED ORDER — IOPAMIDOL (ISOVUE-M 200) INJECTION 41%
INTRAMUSCULAR | Status: AC
  Filled 2016-11-02: qty 10

## 2016-11-02 MED ORDER — LIDOCAINE HCL (PF) 1 % IJ SOLN
5.0000 mL | Freq: Once | INTRAMUSCULAR | Status: AC
  Administered 2016-11-02: 2 mL via INTRADERMAL

## 2016-11-02 MED ORDER — AMOXICILLIN-POT CLAVULANATE 500-125 MG PO TABS
500.0000 mg | ORAL_TABLET | Freq: Two times a day (BID) | ORAL | Status: DC
  Administered 2016-11-02 – 2016-11-04 (×5): 500 mg via ORAL
  Filled 2016-11-02 (×5): qty 1

## 2016-11-02 MED ORDER — LIDOCAINE HCL 1 % IJ SOLN
INTRAMUSCULAR | Status: AC
  Filled 2016-11-02: qty 10

## 2016-11-02 NOTE — Progress Notes (Signed)
_0         PROGRESS NOTE                                                                                                                                                                                                             Patient Demographics:    Juan [name], is a 76 y.o. male, DOB - Jul 17, 1940, LSL:[id number]  Admit date - 10/31/2016   Admitting Physician Benito Mccreedy, MD  Outpatient Primary MD for the patient is Colon Branch, MD  LOS - 1  Chief Complaint  Patient presents with  . Arm Pain       Brief Narrative   Juan [name] is a 76 y.o. male with medical history significant for but not limited to myeloproliferative disorder presenting with several days history of progressively worsening severe left wrist aching pain radiating to ipsilateral lower forearm associated with swelling of the left wrist, unable to touch it, not relieved by home remedy. Patient was admitted for acute CHF and left upper extremity pain which was evaluated in consultation by [facility] orthopedics in May 2018, but this was mainly centered around his left upper arm/shoulder with no remarkable findings.   Subjective:   Patient in bed, appears comfortable, denies any headache, no fever, no chest pain or pressure, no shortness of breath , no abdominal pain. No focal weakness.  ++ L wrist pain.   Assessment  & Plan :     1. Left wrist acute gouty arthritis. Uric acid 11.3, placed on IV Solu-Medrol and colchicine, discussed the case with hand surgeon Dr. Apolonio Schneiders and orthopedic surgeon Dr. Alvan Dame they have nothing to offer, hand surgery requests that I get IR involved for joint aspiration, IR has been requested to do joint aspiration on 11/01/2016, appropriate studies have been ordered, will await for the procedure to happen, for now left wrist splint has been placed and will continue supportive care. Patient says he feels marginally better.   2. Chronic diastolic CHF. EF 60%. He is currently compensated on  combination of Imdur, diuretic and Coreg.   3. Essential hypertension. Stable on combination of Coreg, diuretic, hydralazine and Imdur.  4. Left frozen shoulder. Outpatient age-appropriate workup.  5. Agnogenic myeloid metaplasia  - Has seen Dr. Marin Olp in the past (consult note 05/07/16 reviewed). Apparently he has refused a bone marrow biopsy and does not want any treatment for this.  6. CKD 3. Baseline creatinine around 1.7. Close to baseline continue to monitor.  7. Pneumonia. Lack of symptoms, we'll transition him to Augmentin  on 11/02/2016, if cultures remain negative will stop antibiotics in 1-2 days completely.  8. DM type II. On diet control, since he is on Solu-Medrol will add sliding scale. Recent A1c was 5.4.    Diet : Diet Heart Room service appropriate? Yes; Fluid consistency: Thin   Family Communication  :  None  Code Status :  Full  Disposition Plan  :  TBD  Consults  :    Procedures  :    Left wrist ultrasound-guided aspiration requested through IR.  DVT Prophylaxis  :    Lab Results  Component Value Date   PLT 447 (H) 11/02/2016    Inpatient Medications  Scheduled Meds: . aspirin  325 mg Oral Daily  . B-complex with vitamin C  1 tablet Oral Daily  . carvedilol  3.125 mg Oral BID WC  . colchicine  0.6 mg Oral Daily  . feeding supplement (GLUCERNA SHAKE)  118.5 mL Oral Daily  . ferrous gluconate  324 mg Oral QHS  . fluticasone  2 spray Each Nare Daily  . heparin  5,000 Units Subcutaneous Q8H  . hydrALAZINE  50 mg Oral TID  . insulin aspart  0-5 Units Subcutaneous QHS  . insulin aspart  0-9 Units Subcutaneous TID WC  . isosorbide mononitrate  30 mg Oral BID  . lidocaine   Topical TID  . mouth rinse  15 mL Mouth Rinse BID  . methocarbamol  750 mg Oral TID  . methylPREDNISolone (SOLU-MEDROL) injection  60 mg Intravenous Q12H  . potassium chloride SA  20 mEq Oral BID  . torsemide  80 mg Oral Daily   Continuous Infusions: . ceFEPime (MAXIPIME) IV  1 g (11/02/16 0907)  . vancomycin Stopped (11/02/16 0348)   PRN Meds:.acetaminophen, bisacodyl, docusate sodium, gabapentin, HYDROcodone-acetaminophen, ketorolac, simethicone  Antibiotics  :    Anti-infectives    Start     Dose/Rate Route Frequency Ordered Stop   11/01/16 0230  vancomycin (VANCOCIN) IVPB 750 mg/150 ml premix     750 mg 150 mL/hr over 60 Minutes Intravenous Every 12 hours 10/31/16 1708     10/31/16 2330  ceFEPIme (MAXIPIME) 1 g in dextrose 5 % 50 mL IVPB     1 g 100 mL/hr over 30 Minutes Intravenous Every 8 hours 10/31/16 1657 11/08/16 2329   10/31/16 1500  ceFEPIme (MAXIPIME) 1 g in dextrose 5 % 50 mL IVPB     1 g 100 mL/hr over 30 Minutes Intravenous  Once 10/31/16 1345 10/31/16 1607   10/31/16 1400  vancomycin (VANCOCIN) IVPB 1000 mg/200 mL premix     1,000 mg 200 mL/hr over 60 Minutes Intravenous  Once 10/31/16 1345 10/31/16 1530         Objective:   Vitals:   11/01/16 0500 11/01/16 0903 11/01/16 2200 11/02/16 0900  BP: 135/61 (!) 145/65 114/70 126/77  Pulse: 85 88 68 84  Resp:  20 19   Temp: 98.6 F (37 C) 97.4 F (36.3 C) 98.5 F (36.9 C) 97.5 F (36.4 C)  TempSrc: Oral Oral Oral Oral  SpO2: 97% 97% 97% 100%  Weight: 89.8 kg (198 lb)  90.1 kg (198 lb 10.2 oz)   Height:        Wt Readings from Last 3 Encounters:  11/01/16 90.1 kg (198 lb 10.2 oz)  10/30/16 88 kg (194 lb)  10/27/16 88 kg (194 lb)     Intake/Output Summary (Last 24 hours) at 11/02/16 1012 Last data filed at 11/02/16 0700  Gross per  24 hour  Intake              680 ml  Output              825 ml  Net             -145 ml     Physical Exam  Awake Alert, Oriented X 3, No new F.N deficits, Normal affect [place].AT,PERRAL Supple Neck,No JVD, No cervical lymphadenopathy appriciated.  Symmetrical Chest wall movement, Good air movement bilaterally, CTAB RRR,No Gallops,Rubs or new Murmurs, No Parasternal Heave +ve B.Sounds, Abd Soft, No tenderness, No organomegaly appriciated, No  rebound - guarding or rigidity. No Cyanosis, Clubbing or edema, No new Rash or bruise  He has chronic left frozen shoulder with limited mobility, left wrist has significant effusion and edema, extremely tender to palpate, minimal warmth around it, wearing wrist splint    Data Review:    CBC  Recent Labs Lab 10/31/16 0810 11/01/16 0638 11/02/16 0646  WBC 11.8* 6.7 9.4  HGB 10.2* 8.5* 9.6*  HCT 33.4* 27.9* 31.3*  PLT 582* 362 447*  MCV 83.5 82.5 83.7  MCH 25.5* 25.1* 25.7*  MCHC 30.5 30.5 30.7  RDW 19.0* 18.3* 18.8*  LYMPHSABS 1.9  --   --   MONOABS 3.0*  --   --   EOSABS 0.0  --   --   BASOSABS 0.5*  --   --     Chemistries   Recent Labs Lab 10/31/16 0810 11/01/16 0638 11/02/16 0646  NA 138 140 135  K 3.8 4.2 5.0  CL 102 105 102  CO2 _0 GLUCOSE 125* 100* 200*  BUN 72* 70* 81*  CREATININE 1.39* 1.42* 1.67*  CALCIUM 9.8 9.1 9.3  AST 22  --   --   ALT 13*  --   --   ALKPHOS 112  --   --   BILITOT 0.6  --   --    ------------------------------------------------------------------------------------------------------------------ No results for input(s): CHOL, HDL, LDLCALC, TRIG, CHOLHDL, LDLDIRECT in the last 72 hours.  Lab Results  Component Value Date   HGBA1C 5.8 07/28/2016   ------------------------------------------------------------------------------------------------------------------ No results for input(s): TSH, T4TOTAL, T3FREE, THYROIDAB in the last 72 hours.  Invalid input(s): FREET3 ------------------------------------------------------------------------------------------------------------------ No results for input(s): VITAMINB12, FOLATE, FERRITIN, TIBC, IRON, RETICCTPCT in the last 72 hours.  Coagulation profile No results for input(s): INR, PROTIME in the last 168 hours.  No results for input(s): DDIMER in the last 72 hours.  Cardiac Enzymes  Recent Labs Lab 10/31/16 1841 11/01/16 0046 11/01/16 0638  TROPONINI 0.03* <0.03 <0.03    ------------------------------------------------------------------------------------------------------------------    Component Value Date/Time   BNP 389.5 (H) 10/31/2016 1605    Micro Results Recent Results (from the past 240 hour(s))  Culture, blood (routine x 2)     Status: None (Preliminary result)   Collection Time: 10/31/16  8:00 AM  Result Value Ref Range Status   Specimen Description BLOOD RIGHT ANTECUBITAL  Final   Special Requests   Final    BOTTLES DRAWN AEROBIC AND ANAEROBIC Blood Culture adequate volume   Culture NO GROWTH 1 DAY  Final   Report Status PENDING  Incomplete  Culture, blood (routine x 2)     Status: None (Preliminary result)   Collection Time: 10/31/16  8:10 AM  Result Value Ref Range Status   Specimen Description BLOOD RIGHT HAND  Final   Special Requests   Final    BOTTLES DRAWN AEROBIC ONLY Blood Culture adequate  volume   Culture NO GROWTH 1 DAY  Final   Report Status PENDING  Incomplete    Radiology Reports Dg Chest 2 View  Result Date: 10/31/2016 CLINICAL DATA:  Arm pain. EXAM: CHEST  2 VIEW COMPARISON:  10/15/2016. FINDINGS: There is cardiac enlargement. Decreased lung volumes. Diffuse bilateral interstitial and airspace opacities are identified which may reflect pulmonary edema or multifocal infection. Diffuse abnormal sclerosis of the visualized osseous structures again noted. IMPRESSION: 1. Bilateral interstitial and airspace opacities which may reflect pulmonary edema and/or infection. 2. Diffuse osteosclerosis noted. Electronically Signed   By: Signa Kell M.D.   On: 10/31/2016 09:09   Dg Chest 2 View  Result Date: 10/15/2016 CLINICAL DATA:  Follow-up pneumonia. Current history of CHF, stage 3 chronic kidney disease, myeloid metaplasia/myeloproliferative disease. EXAM: CHEST  2 VIEW COMPARISON:  10/12/2016, 10/08/2016 and earlier including CT chest 04/01/2016. FINDINGS: Cardiac silhouette markedly enlarged, unchanged. Pulmonary venous  hypertension without evidence of pulmonary edema currently. Improved aeration in the lower lobes since the examination 3 days ago with only mild right lower lobe opacity persisting. No new pulmonary parenchymal abnormalities. Diffuse osteosclerosis as noted previously. IMPRESSION: Improved bilateral pneumonia since the examination 3 days ago with only minimal right lower lobe opacity persisting. No new abnormalities. Stable cardiomegaly without pulmonary edema currently. Electronically Signed   By: Hulan Saas M.D.   On: 10/15/2016 20:04   Dg Chest 2 View  Result Date: 10/08/2016 CLINICAL DATA:  Fever, cough, shortness of breath, numbness down the left arm. EXAM: CHEST  2 VIEW COMPARISON:  05/05/2016 FINDINGS: Shallow inspiration with atelectasis in the lung bases. Cardiac enlargement with mild pulmonary vascular congestion. Hazy perihilar opacities may represent mild pulmonary edema. No pleural effusions. No pneumothorax. Mild thoracic scoliosis convex towards the right. IMPRESSION: Cardiac enlargement with mild pulmonary vascular congestion and probable mild perihilar edema. Shallow inspiration with atelectasis in the lung bases. Electronically Signed   By: Burman Nieves M.D.   On: 10/08/2016 04:56   Dg Forearm Left  Result Date: 10/30/2016 CLINICAL DATA:  Arm pain beginning today. EXAM: LEFT FOREARM - 2 VIEW COMPARISON:  None. FINDINGS: Diffuse sclerotic and permeative appearance of the visualized skeleta, present elsewhere in the skeleton by chest CT since at least 2015. No superimposed fracture or malalignment. No suspected elbow joint effusion. IMPRESSION: 1. No acute finding. 2. Chronic sclerotic and permeative appearance of the skeleton in this patient with history of myeloproliferative disease. Electronically Signed   By: Marnee Spring M.D.   On: 10/30/2016 17:22   Mr Cervical Spine Wo Contrast  Result Date: 10/09/2016 CLINICAL DATA:  Myeloproliferative disorder with left arm pain.  EXAM: MRI CERVICAL SPINE WITHOUT CONTRAST TECHNIQUE: Multiplanar, multisequence MR imaging of the cervical spine was performed. No intravenous contrast was administered. COMPARISON:  1. Bone scan 04/17/2016 2. Chest CT 04/01/2016 FINDINGS: Alignment: Normal Vertebrae: There is low T1 weighted signal throughout the bone marrow with heterogeneous areas of high T2 weighted signal. No discrete focal marrow lesion. Cord: Normal Posterior Fossa, vertebral arteries, paraspinal tissues: Visualized posterior fossa is normal. Vertebral artery flow voids are preserved. Normal visualized paraspinal soft tissues. Disc levels: C1-C2: Normal. C2-C3: Disc desiccation without significant bulge.  No stenosis. C3-C4: Disc space narrowing with mild uncovertebral hypertrophy. No stenosis. C4-C5: Mild bilateral uncovertebral and facet hypertrophy without associated stenosis. C5-C6: Disc desiccation with small bulge narrowing the ventral thecal sac. Mild bilateral uncovertebral hypertrophy. No stenosis. C6-C7: Disc desiccation with narrowing of the disc space and type 2 Modic  degenerative endplate changes. C7-T1: Normal disc space and facets. No spinal canal or neuroforaminal stenosis. Within the medial aspect of the left T2 transverse process, there is a hyperintense T2 weighted signal lesion measuring 8 mm, seen on coronal image 5. IMPRESSION: 1. 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 No specific finding to explain the left arm pain. 2. Diffuse heterogeneous bone marrow signal which is compatible with the patient's known myeloproliferative disorder. 3. Focal T2 hyperintense lesion in the left transverse process of T2, seen only on the coronal sequence and thus incompletely evaluated, but unchanged compared to the chest CT of 04/01/2016. Electronically Signed   By: Ulyses Jarred M.D.   On: 10/09/2016 14:00   Mr Shoulder Left W Wo Contrast  Result Date:  10/14/2016 CLINICAL DATA:  Left all arm and shoulder pain scratch the left arm and shoulder pain with weakness. History of myeloproliferative disorder. Fever. EXAM: MRI OF THE LEFT SHOULDER WITHOUT AND WITH CONTRAST TECHNIQUE: Multiplanar, multisequence MR imaging of the left shoulder was performed before and after the administration of intravenous contrast. CONTRAST:  8 ml MULTIHANCE GADOBENATE DIMEGLUMINE 529 MG/ML IV SOLN COMPARISON:  Plain films left shoulder 10/09/2016. FINDINGS: Patient motion degrades the exam. Rotator cuff: The patient has supraspinatus and infraspinatus tendinopathy. There is a partial width, full-thickness tear of the posterior fibers of the supraspinatus measuring approximately 0.7 cm from front to back with retraction of 1-2 cm. The rotator cuff is otherwise intact. Muscles:  No focal atrophy or lesion. Biceps long head:  Intact. Acromioclavicular Joint: Mild degenerative change is seen. Type 1 acromion. No subacromial/subdeltoid bursal fluid. Glenohumeral Joint: Negative. Labrum:  Grossly intact. Bones: Abnormal marrow signal is present in all imaged bones. No fracture is identified. No evidence of osteomyelitis. Other: None. IMPRESSION: Abnormal marrow signal in all imaged bones consistent with the patient's history of myeloproliferative disorder, multiple myeloma or less likely metastatic disease. Negative for evidence of infectious process. Rotator cuff tendinopathy with a 0.7 cm full-thickness tear of the posterior supraspinatus with mild retraction and no atrophy. Electronically Signed   By: Inge Rise M.D.   On: 10/14/2016 09:28   Nm Pulmonary Perf And Vent  Result Date: 10/15/2016 CLINICAL DATA:  Short of breath. Concern pulmonary embolism. Pulmonary hypertension. EXAM: NUCLEAR MEDICINE VENTILATION - PERFUSION LUNG SCAN TECHNIQUE: Ventilation images were obtained in multiple projections using inhaled aerosol Tc-65mDTPA. Perfusion images were obtained in multiple  projections after intravenous injection of Tc-957mAA. LAO/RPO Oblique projections not performed. Patient became nauseous and vomited. RADIOPHARMACEUTICALS:  30.0 mCi Technetium-9957mPA aerosol inhalation and 4.0 mCi Technetium-71m48m IV COMPARISON:  Chest radiograph 10/15/2016 FINDINGS: Ventilation: No focal ventilation defect. Elevation RIGHT hemidiaphragm Perfusion: No wedge shaped peripheral perfusion defects to suggest acute pulmonary embolism. IMPRESSION: No evidence acute or chronic pulmonary embolism. Electronically Signed   By: StewSuzy Bouchard.   On: 10/15/2016 17:29   Dg Chest Port 1 View  Result Date: 10/12/2016 CLINICAL DATA:  75 y10r old male with history of fever. EXAM: PORTABLE CHEST 1 VIEW COMPARISON:  Chest x-ray a 10/08/2016. FINDINGS: Lung volumes are low. Opacity multifocal interstitial and airspace disease, most confluent in the left lower lobe. No definite pleural effusions. Heart size is borderline enlarged, likely accentuated by low lung volumes. Pulmonary vasculature does not appear engorged. Upper mediastinal contours are distorted by patient positioning. IMPRESSION: 1. Patchy asymmetrically distributed multifocal interstitial and airspace disease most confluent in the left lower lobe, concerning for multilobar  bronchopneumonia. Electronically Signed   By: Vinnie Langton M.D.   On: 10/12/2016 10:45   Dg Shoulder Left  Result Date: 10/09/2016 CLINICAL DATA:  Left neck pain.  Arm pain.  Left hand swelling . EXAM: LEFT SHOULDER - 2+ VIEW COMPARISON:  Chest x-ray 10/08/2016 FINDINGS: No acute bony or joint abnormality identified. No evidence of fracture or dislocation. IMPRESSION: No acute or focal abnormality. Electronically Signed   By: Marcello Moores  Register   On: 10/09/2016 13:42   Dg Humerus Left  Result Date: 10/30/2016 CLINICAL DATA:  Left humerus pain beginning this morning. No specific injury. EXAM: LEFT HUMERUS - 2+ VIEW COMPARISON:  None. FINDINGS: Mottled sclerotic  bones diffusely correlating with history of myeloproliferative disease. These changes have been seen elsewhere in the skeleton since at least 2015 chest CT. No acute fracture or focal bony erosion. No acute soft tissue finding. There is question of 13 mm nodule over the left mid lung, but none seen on dedicated chest x-ray from 2 weeks prior. IMPRESSION: 1. No acute finding. 2. Chronic sclerotic and permeative appearance of the skeleton correlating with history of myeloproliferative disease. Electronically Signed   By: Monte Fantasia M.D.   On: 10/30/2016 17:21   Dg Hand Complete Left  Result Date: 10/30/2016 CLINICAL DATA:  Generalized left upper extremity pain. EXAM: LEFT HAND - COMPLETE 3+ VIEW COMPARISON:  None. FINDINGS: Diffuse osteopenia. No evidence of fracture or dislocation. Osteoarthritic changes at the radiocarpal joint, intercarpal joints and first carpometacarpal joint. IMPRESSION: No acute fracture or dislocation identified about the left head. Multilevel osteoarthritic changes. Electronically Signed   By: Fidela Salisbury M.D.   On: 10/30/2016 17:23    Time Spent in minutes  30   Lala Lund M.D on 11/02/2016 at 10:12 AM  Between 7am to 7pm - Pager - [phone] ( page via [email or web address], text pages only, please mention full 10 digit call back number). After 7pm go to [email or web address] - password [email or web address]

## 2016-11-02 NOTE — Procedures (Signed)
Radiology Procedure Note  Procedure: Aspiration of LEFT wrist radiocarpal joint.  No significant effusion or fluid return.  Small volume sterile saline injected into joint space and aspirated.  Fluid sent to lab for evaluation.   Complications: None  Estimated Blood Loss: None  Recommendations: None  Signed,  Criselda Peaches, MD

## 2016-11-02 NOTE — Progress Notes (Signed)
PT Cancellation Note  Patient Details Name: Juan [name] MRN: [id number] DOB: [birth year]   Cancelled Treatment:    Reason Eval/Treat Not Completed: Patient declined, no reason specified   Tells me he feels lousy;   Roney [place], [place]  Acute Rehabilitation Services Pager [phone] Office [phone]    Colletta [place] 11/02/2016, 12:10 PM

## 2016-11-02 NOTE — Progress Notes (Signed)
Orthopedic Tech Progress Note Patient Details:  Juan [name] Jul 10, 1940 [id number]  Ortho Devices Type of Ortho Device: Velcro wrist splint Ortho Device/Splint Location: lue Ortho Device/Splint Interventions: Application   [name] 11/02/2016, 7:58 AM

## 2016-11-02 NOTE — Telephone Encounter (Signed)
Received Certificate of Medical Necessity CMS 484 Oxygen with corrections need to be made by provider with instructions on how to correct, forwarded to provider/SLS 06/18

## 2016-11-03 ENCOUNTER — Inpatient Hospital Stay (HOSPITAL_COMMUNITY): Payer: Medicare Other

## 2016-11-03 LAB — BASIC METABOLIC PANEL
Anion gap: 9 (ref 5–15)
BUN: 94 mg/dL — AB (ref 6–20)
CALCIUM: 8.9 mg/dL (ref 8.9–10.3)
CO2: 23 mmol/L (ref 22–32)
Chloride: 102 mmol/L (ref 101–111)
Creatinine, Ser: 1.84 mg/dL — ABNORMAL HIGH (ref 0.61–1.24)
GFR calc Af Amer: 40 mL/min — ABNORMAL LOW (ref >60)
GFR, EST NON AFRICAN AMERICAN: 34 mL/min — AB (ref >60)
GLUCOSE: 171 mg/dL — AB (ref 65–99)
Potassium: 5.9 mmol/L — ABNORMAL HIGH (ref 3.5–5.1)
Sodium: 134 mmol/L — ABNORMAL LOW (ref 135–145)

## 2016-11-03 LAB — GLUCOSE, CAPILLARY
GLUCOSE-CAPILLARY: 120 mg/dL — AB (ref 65–99)
Glucose-Capillary: 191 mg/dL — ABNORMAL HIGH (ref 65–99)
Glucose-Capillary: 232 mg/dL — ABNORMAL HIGH (ref 65–99)
Glucose-Capillary: 119 mg/dL — ABNORMAL HIGH (ref 65–99)

## 2016-11-03 LAB — CBC
HCT: 29.4 % — ABNORMAL LOW (ref 39.0–52.0)
Hemoglobin: 9.1 g/dL — ABNORMAL LOW (ref 13.0–17.0)
MCH: 25.1 pg — AB (ref 26.0–34.0)
MCHC: 31 g/dL (ref 30.0–36.0)
MCV: 81.2 fL (ref 78.0–100.0)
PLATELETS: 363 10*3/uL (ref 150–400)
RBC: 3.62 MIL/uL — ABNORMAL LOW (ref 4.22–5.81)
RDW: 18.2 % — ABNORMAL HIGH (ref 11.5–15.5)
WBC: 9.2 10*3/uL (ref 4.0–10.5)

## 2016-11-03 LAB — PATHOLOGIST SMEAR REVIEW

## 2016-11-03 MED ORDER — DOCUSATE SODIUM 100 MG PO CAPS
200.0000 mg | ORAL_CAPSULE | Freq: Two times a day (BID) | ORAL | Status: DC
Start: 2016-11-03 — End: 2016-11-04
  Administered 2016-11-03 – 2016-11-04 (×3): 200 mg via ORAL
  Filled 2016-11-03 (×4): qty 2

## 2016-11-03 MED ORDER — SODIUM POLYSTYRENE SULFONATE 15 GM/60ML PO SUSP
30.0000 g | Freq: Once | ORAL | Status: AC
  Administered 2016-11-03: 30 g via ORAL
  Filled 2016-11-03: qty 120

## 2016-11-03 MED ORDER — SODIUM CHLORIDE 0.9 % IV SOLN
INTRAVENOUS | Status: DC
  Administered 2016-11-03: 11:00:00 via INTRAVENOUS

## 2016-11-03 MED ORDER — FUROSEMIDE 10 MG/ML IJ SOLN
20.0000 mg | Freq: Once | INTRAMUSCULAR | Status: AC
  Administered 2016-11-03: 20 mg via INTRAVENOUS
  Filled 2016-11-03 (×2): qty 2

## 2016-11-03 MED ORDER — BISACODYL 10 MG RE SUPP
10.0000 mg | Freq: Every day | RECTAL | Status: DC
Start: 2016-11-03 — End: 2016-11-04
  Administered 2016-11-03: 10 mg via RECTAL
  Filled 2016-11-03 (×3): qty 1

## 2016-11-03 MED ORDER — LORAZEPAM 2 MG/ML IJ SOLN
1.0000 mg | Freq: Once | INTRAMUSCULAR | Status: AC | PRN
  Administered 2016-11-03: 1 mg via INTRAVENOUS
  Filled 2016-11-03: qty 1

## 2016-11-03 NOTE — Progress Notes (Signed)
_0         PROGRESS NOTE                                                                                                                                                                                                             Patient Demographics:    Juan [name], is a 76 y.o. male, DOB - 02-Jul-1940, DJT:[id number]  Admit date - 10/31/2016   Admitting Physician Benito Mccreedy, MD  Outpatient Primary MD for the patient is Colon Branch, MD  LOS - 2  Chief Complaint  Patient presents with  . Arm Pain       Brief Narrative   Juan [name] is a 76 y.o. male with medical history significant for but not limited to myeloproliferative disorder presenting with several days history of progressively worsening severe left wrist aching pain radiating to ipsilateral lower forearm associated with swelling of the left wrist, unable to touch it, not relieved by home remedy. Patient was admitted for acute CHF and left upper extremity pain which was evaluated in consultation by [facility] orthopedics in May 2018, but this was mainly centered around his left upper arm/shoulder with no remarkable findings.   Subjective:   Patient in bed, appears comfortable, denies any headache, no fever, no chest pain or pressure, no shortness of breath , no abdominal pain. No focal weakness.  ++ L wrist pain.   Assessment  & Plan :     1. Left wrist acute gouty arthritis. Uric acid 11.3, was placed on IV Solu-Medrol and colchicine, had discussed the case with hand surgeon Dr. Apolonio Schneiders and orthopedic surgeon Dr. Alvan Dame they had nothing to offer, I then requested IR to do ultrasound-guided joint aspiration on 11/02/2016, fluid is unimpressive without any crystals. X-rays were unremarkable. CT scan of the left wrist was poor quality study. Have now ordered MRI left wrist which patient had been initially refusing but now is agreeable.  I still think this is gouty arthritis despite unimpressive joint fluid, I have requested  hand surgeon after [facility] to see the patient formally, continue colchicine but will now hold Solu-Medrol till hand surgery sees the patient. He might benefit from intra-articular steroid shot. Continue wrist splint and follow MRI results.  2. Chronic diastolic CHF. EF 60%. He is currently compensated on combination of Imdur, diuretic and Coreg. We will hold diuretic on 11/03/2016 as creatinine has bumped.  3. Essential hypertension. Stable on combination of Coreg, diuretic, hydralazine and Imdur.  4. Left frozen shoulder. Outpatient age-appropriate workup.  5. Agnogenic myeloid  metaplasia  - Has seen Dr. Marin Olp in the past (consult note 05/07/16 reviewed). Apparently he has refused a bone marrow biopsy and does not want any treatment for this.  6. ARF on CKD 3. Baseline creatinine around 15 to 1.7. It worsening in creatinine, hold diuretic on 11/03/2016, gently hydrate and monitor.  7. Pneumonia. Lack of symptoms, we'll transition him to Augmentin on 11/02/2016, if cultures remain negative will stop antibiotics in 1-2 days completely.  8. Hyperkalemia. Kayexalate, bowel regimen, gentle hydration with low-dose Lasix at night, repeat BMP in the morning.   9. DM type II. On diet control, since he is on Solu-Medrol will add sliding scale. Recent A1c was 5.4.    Diet : Diet NPO time specified Except for: Sips with Meds   Family Communication  :  None  Code Status :  Full  Disposition Plan  :  TBD  Consults  :  Hand Surgery  Procedures  :    Left wrist ultrasound-guided aspiration requested through IR.  DVT Prophylaxis  :    Lab Results  Component Value Date   PLT 363 11/03/2016    Inpatient Medications  Scheduled Meds: . amoxicillin-clavulanate  500 mg Oral Q12H  . aspirin  325 mg Oral Daily  . B-complex with vitamin C  1 tablet Oral Daily  . carvedilol  3.125 mg Oral BID WC  . colchicine  0.6 mg Oral Daily  . feeding supplement (GLUCERNA SHAKE)  118.5 mL Oral Daily  .  ferrous gluconate  324 mg Oral QHS  . fluticasone  2 spray Each Nare Daily  . heparin  5,000 Units Subcutaneous Q8H  . hydrALAZINE  50 mg Oral TID  . insulin aspart  0-5 Units Subcutaneous QHS  . insulin aspart  0-9 Units Subcutaneous TID WC  . isosorbide mononitrate  30 mg Oral BID  . lidocaine   Topical TID  . mouth rinse  15 mL Mouth Rinse BID  . methocarbamol  750 mg Oral TID   Continuous Infusions: . sodium chloride     PRN Meds:.acetaminophen, bisacodyl, docusate sodium, gabapentin, HYDROcodone-acetaminophen, LORazepam, simethicone  Antibiotics  :    Anti-infectives    Start     Dose/Rate Route Frequency Ordered Stop   11/02/16 1130  amoxicillin-clavulanate (AUGMENTIN) 500-125 MG per tablet 500 mg     500 mg Oral Every 12 hours 11/02/16 1028 11/08/16 0959   11/01/16 0230  vancomycin (VANCOCIN) IVPB 750 mg/150 ml premix  Status:  Discontinued     750 mg 150 mL/hr over 60 Minutes Intravenous Every 12 hours 10/31/16 1708 11/02/16 1016   10/31/16 2330  ceFEPIme (MAXIPIME) 1 g in dextrose 5 % 50 mL IVPB  Status:  Discontinued     1 g 100 mL/hr over 30 Minutes Intravenous Every 8 hours 10/31/16 1657 11/02/16 1016   10/31/16 1500  ceFEPIme (MAXIPIME) 1 g in dextrose 5 % 50 mL IVPB     1 g 100 mL/hr over 30 Minutes Intravenous  Once 10/31/16 1345 10/31/16 1607   10/31/16 1400  vancomycin (VANCOCIN) IVPB 1000 mg/200 mL premix     1,000 mg 200 mL/hr over 60 Minutes Intravenous  Once 10/31/16 1345 10/31/16 1530         Objective:   Vitals:   11/02/16 1700 11/02/16 2300 11/03/16 0438 11/03/16 0947  BP: (!) 146/81 135/73 125/69 (!) 118/57  Pulse: 81 86 78 62  Resp: _0 Temp: 98.5 F (36.9 C) 98.4 F (36.9  C) 98 F (36.7 C) 98.2 F (36.8 C)  TempSrc: Oral Oral Oral Oral  SpO2: 98% 92% 98% 99%  Weight:  92.1 kg (203 lb)    Height:  6' 3.6" (1.92 m)      Wt Readings from Last 3 Encounters:  11/02/16 92.1 kg (203 lb)  10/30/16 88 kg (194 lb)  10/27/16 88 kg  (194 lb)     Intake/Output Summary (Last 24 hours) at 11/03/16 1020 Last data filed at 11/03/16 0947  Gross per 24 hour  Intake              360 ml  Output             1600 ml  Net            -1240 ml     Physical Exam  Awake Alert, Oriented X 3, No new F.N deficits, Normal affect .AT,PERRAL Supple Neck,No JVD, No cervical lymphadenopathy appriciated.  Symmetrical Chest wall movement, Good air movement bilaterally, CTAB RRR,No Gallops,Rubs or new Murmurs, No Parasternal Heave +ve B.Sounds, Abd Soft, No tenderness, No organomegaly appriciated, No rebound - guarding or rigidity. No Cyanosis, Clubbing or edema, No new Rash or bruise  He has chronic left frozen shoulder with limited mobility, left wrist has significant effusion and edema, extremely tender to palpate, minimal warmth around it, wearing wrist splint    Data Review:    CBC  Recent Labs Lab 10/31/16 0810 11/01/16 0638 11/02/16 0646 11/03/16 0547  WBC 11.8* 6.7 9.4 9.2  HGB 10.2* 8.5* 9.6* 9.1*  HCT 33.4* 27.9* 31.3* 29.4*  PLT 582* 362 447* 363  MCV 83.5 82.5 83.7 81.2  MCH 25.5* 25.1* 25.7* 25.1*  MCHC 30.5 30.5 30.7 31.0  RDW 19.0* 18.3* 18.8* 18.2*  LYMPHSABS 1.9  --   --   --   MONOABS 3.0*  --   --   --   EOSABS 0.0  --   --   --   BASOSABS 0.5*  --   --   --     Chemistries   Recent Labs Lab 10/31/16 0810 11/01/16 0638 11/02/16 0646 11/03/16 0547  NA 138 140 135 134*  K 3.8 4.2 5.0 5.9*  CL 102 105 102 102  CO2 _0 GLUCOSE 125* 100* 200* 171*  BUN 72* 70* 81* 94*  CREATININE 1.39* 1.42* 1.67* 1.84*  CALCIUM 9.8 9.1 9.3 8.9  AST 22  --   --   --   ALT 13*  --   --   --   ALKPHOS 112  --   --   --   BILITOT 0.6  --   --   --    ------------------------------------------------------------------------------------------------------------------ No results for input(s): CHOL, HDL, LDLCALC, TRIG, CHOLHDL, LDLDIRECT in the last 72 hours.  Lab Results  Component Value Date    HGBA1C 5.8 07/28/2016   ------------------------------------------------------------------------------------------------------------------ No results for input(s): TSH, T4TOTAL, T3FREE, THYROIDAB in the last 72 hours.  Invalid input(s): FREET3 ------------------------------------------------------------------------------------------------------------------ No results for input(s): VITAMINB12, FOLATE, FERRITIN, TIBC, IRON, RETICCTPCT in the last 72 hours.  Coagulation profile No results for input(s): INR, PROTIME in the last 168 hours.  No results for input(s): DDIMER in the last 72 hours.  Cardiac Enzymes  Recent Labs Lab 10/31/16 1841 11/01/16 0046 11/01/16 0638  TROPONINI 0.03* <0.03 <0.03   ------------------------------------------------------------------------------------------------------------------    Component Value Date/Time   BNP 389.5 (H) 10/31/2016 1605    Micro Results Recent Results (from the  past 240 hour(s))  Culture, blood (routine x 2)     Status: None (Preliminary result)   Collection Time: 10/31/16  8:00 AM  Result Value Ref Range Status   Specimen Description BLOOD RIGHT ANTECUBITAL  Final   Special Requests   Final    BOTTLES DRAWN AEROBIC AND ANAEROBIC Blood Culture adequate volume   Culture NO GROWTH 2 DAYS  Final   Report Status PENDING  Incomplete  Culture, blood (routine x 2)     Status: None (Preliminary result)   Collection Time: 10/31/16  8:10 AM  Result Value Ref Range Status   Specimen Description BLOOD RIGHT HAND  Final   Special Requests   Final    BOTTLES DRAWN AEROBIC ONLY Blood Culture adequate volume   Culture NO GROWTH 2 DAYS  Final   Report Status PENDING  Incomplete  Body fluid culture     Status: None (Preliminary result)   Collection Time: 11/02/16  4:19 PM  Result Value Ref Range Status   Specimen Description SYNOVIAL LEFT WRIST  Final   Special Requests NONE  Final   Gram Stain   Final    ABUNDANT WBC PRESENT,  PREDOMINANTLY PMN NO ORGANISMS SEEN    Culture PENDING  Incomplete   Report Status PENDING  Incomplete    Radiology Reports Dg Chest 2 View  Result Date: 11/02/2016 CLINICAL DATA:  Nonproductive cough and shortness of breath intermittently for the past month. History of hypertension, diabetes, coronary artery disease, and CHF. Former smoker. EXAM: CHEST  2 VIEW COMPARISON:  Chest x-ray and October 31, 2016 and Oct 08, 2016. FINDINGS: The right hemidiaphragm remains higher than the left. The interstitial markings of both lungs are increased. The areas of confluent density seen on the earlier study are less conspicuous today. The cardiac silhouette remains enlarged. The pulmonary vascularity is mildly prominent. There is no pleural effusion. IMPRESSION: Mild interval improvement in the appearance of the pulmonary interstitium. Persistent interstitial edema or pneumonia is present. Electronically Signed   By: David  Martinique M.D.   On: 11/02/2016 15:37   Dg Chest 2 View  Result Date: 10/31/2016 CLINICAL DATA:  Arm pain. EXAM: CHEST  2 VIEW COMPARISON:  10/15/2016. FINDINGS: There is cardiac enlargement. Decreased lung volumes. Diffuse bilateral interstitial and airspace opacities are identified which may reflect pulmonary edema or multifocal infection. Diffuse abnormal sclerosis of the visualized osseous structures again noted. IMPRESSION: 1. Bilateral interstitial and airspace opacities which may reflect pulmonary edema and/or infection. 2. Diffuse osteosclerosis noted. Electronically Signed   By: Kerby Moors M.D.   On: 10/31/2016 09:09   Dg Chest 2 View  Result Date: 10/15/2016 CLINICAL DATA:  Follow-up pneumonia. Current history of CHF, stage 3 chronic kidney disease, myeloid metaplasia/myeloproliferative disease. EXAM: CHEST  2 VIEW COMPARISON:  10/12/2016, 10/08/2016 and earlier including CT chest 04/01/2016. FINDINGS: Cardiac silhouette markedly enlarged, unchanged. Pulmonary venous hypertension  without evidence of pulmonary edema currently. Improved aeration in the lower lobes since the examination 3 days ago with only mild right lower lobe opacity persisting. No new pulmonary parenchymal abnormalities. Diffuse osteosclerosis as noted previously. IMPRESSION: Improved bilateral pneumonia since the examination 3 days ago with only minimal right lower lobe opacity persisting. No new abnormalities. Stable cardiomegaly without pulmonary edema currently. Electronically Signed   By: Evangeline Dakin M.D.   On: 10/15/2016 20:04   Dg Chest 2 View  Result Date: 10/08/2016 CLINICAL DATA:  Fever, cough, shortness of breath, numbness down the left arm. EXAM: CHEST  2  VIEW COMPARISON:  05/05/2016 FINDINGS: Shallow inspiration with atelectasis in the lung bases. Cardiac enlargement with mild pulmonary vascular congestion. Hazy perihilar opacities may represent mild pulmonary edema. No pleural effusions. No pneumothorax. Mild thoracic scoliosis convex towards the right. IMPRESSION: Cardiac enlargement with mild pulmonary vascular congestion and probable mild perihilar edema. Shallow inspiration with atelectasis in the lung bases. Electronically Signed   By: Lucienne Capers M.D.   On: 10/08/2016 04:56   Dg Forearm Left  Result Date: 10/30/2016 CLINICAL DATA:  Arm pain beginning today. EXAM: LEFT FOREARM - 2 VIEW COMPARISON:  None. FINDINGS: Diffuse sclerotic and permeative appearance of the visualized skeleta, present elsewhere in the skeleton by chest CT since at least 2015. No superimposed fracture or malalignment. No suspected elbow joint effusion. IMPRESSION: 1. No acute finding. 2. Chronic sclerotic and permeative appearance of the skeleton in this patient with history of myeloproliferative disease. Electronically Signed   By: Monte Fantasia M.D.   On: 10/30/2016 17:22   Mr Cervical Spine Wo Contrast  Result Date: 10/09/2016 CLINICAL DATA:  Myeloproliferative disorder with left arm pain. EXAM: MRI  CERVICAL SPINE WITHOUT CONTRAST TECHNIQUE: Multiplanar, multisequence MR imaging of the cervical spine was performed. No intravenous contrast was administered. COMPARISON:  1. Bone scan 04/17/2016 2. Chest CT 04/01/2016 FINDINGS: Alignment: Normal Vertebrae: There is low T1 weighted signal throughout the bone marrow with heterogeneous areas of high T2 weighted signal. No discrete focal marrow lesion. Cord: Normal Posterior Fossa, vertebral arteries, paraspinal tissues: Visualized posterior fossa is normal. Vertebral artery flow voids are preserved. Normal visualized paraspinal soft tissues. Disc levels: C1-C2: Normal. C2-C3: Disc desiccation without significant bulge.  No stenosis. C3-C4: Disc space narrowing with mild uncovertebral hypertrophy. No stenosis. C4-C5: Mild bilateral uncovertebral and facet hypertrophy without associated stenosis. C5-C6: Disc desiccation with small bulge narrowing the ventral thecal sac. Mild bilateral uncovertebral hypertrophy. No stenosis. C6-C7: Disc desiccation with narrowing of the disc space and type 2 Modic degenerative endplate changes. C7-T1: Normal disc space and facets. No spinal canal or neuroforaminal stenosis. Within the medial aspect of the left T2 transverse process, there is a hyperintense T2 weighted signal lesion measuring 8 mm, seen on coronal image 5. IMPRESSION: 1. 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 No specific finding to explain the left arm pain. 2. Diffuse heterogeneous bone marrow signal which is compatible with the patient's known myeloproliferative disorder. 3. Focal T2 hyperintense lesion in the left transverse process of T2, seen only on the coronal sequence and thus incompletely evaluated, but unchanged compared to the chest CT of 04/01/2016. Electronically Signed   By: Ulyses Jarred M.D.   On: 10/09/2016 14:00   Ct Wrist Left Wo Contrast  Result Date: 11/03/2016 CLINICAL  DATA:  Left wrist pain and swelling. EXAM: CT OF THE LEFT WRIST WITHOUT CONTRAST TECHNIQUE: Multidetector CT imaging was performed according to the standard protocol. Multiplanar CT image reconstructions were also generated. COMPARISON:  None. FINDINGS: The study is nondiagnostic. The patient's wrist had to be scanned at the patient's side due to limitation of patient positioning. In that position, image quality is nondiagnostic. IMPRESSION: CT scan of the left wrist is nondiagnostic. The patient will not be charged for this procedure. Electronically Signed   By: Lorriane Shire M.D.   On: 11/03/2016 09:52   Mr Shoulder Left W Wo Contrast  Result Date: 10/14/2016 CLINICAL DATA:  Left all arm and shoulder pain scratch the left arm and shoulder pain  with weakness. History of myeloproliferative disorder. Fever. EXAM: MRI OF THE LEFT SHOULDER WITHOUT AND WITH CONTRAST TECHNIQUE: Multiplanar, multisequence MR imaging of the left shoulder was performed before and after the administration of intravenous contrast. CONTRAST:  8 ml MULTIHANCE GADOBENATE DIMEGLUMINE 529 MG/ML IV SOLN COMPARISON:  Plain films left shoulder 10/09/2016. FINDINGS: Patient motion degrades the exam. Rotator cuff: The patient has supraspinatus and infraspinatus tendinopathy. There is a partial width, full-thickness tear of the posterior fibers of the supraspinatus measuring approximately 0.7 cm from front to back with retraction of 1-2 cm. The rotator cuff is otherwise intact. Muscles:  No focal atrophy or lesion. Biceps long head:  Intact. Acromioclavicular Joint: Mild degenerative change is seen. Type 1 acromion. No subacromial/subdeltoid bursal fluid. Glenohumeral Joint: Negative. Labrum:  Grossly intact. Bones: Abnormal marrow signal is present in all imaged bones. No fracture is identified. No evidence of osteomyelitis. Other: None. IMPRESSION: Abnormal marrow signal in all imaged bones consistent with the patient's history of  myeloproliferative disorder, multiple myeloma or less likely metastatic disease. Negative for evidence of infectious process. Rotator cuff tendinopathy with a 0.7 cm full-thickness tear of the posterior supraspinatus with mild retraction and no atrophy. Electronically Signed   By: Inge Rise M.D.   On: 10/14/2016 09:28   Nm Pulmonary Perf And Vent  Result Date: 10/15/2016 CLINICAL DATA:  Short of breath. Concern pulmonary embolism. Pulmonary hypertension. EXAM: NUCLEAR MEDICINE VENTILATION - PERFUSION LUNG SCAN TECHNIQUE: Ventilation images were obtained in multiple projections using inhaled aerosol [id number]. Perfusion images were obtained in multiple projections after intravenous injection of Tc-973mAA. LAO/RPO Oblique projections not performed. Patient became nauseous and vomited. RADIOPHARMACEUTICALS:  30.0 mCi Technetium-9922mPA aerosol inhalation and 4.0 mCi Technetium-67m15m IV COMPARISON:  Chest radiograph 10/15/2016 FINDINGS: Ventilation: No focal ventilation defect. Elevation RIGHT hemidiaphragm Perfusion: No wedge shaped peripheral perfusion defects to suggest acute pulmonary embolism. IMPRESSION: No evidence acute or chronic pulmonary embolism. Electronically Signed   By: StewSuzy Bouchard.   On: 10/15/2016 17:29   Dg Chest Port 1 View  Result Date: 10/12/2016 CLINICAL DATA:  75 y84r old male with history of fever. EXAM: PORTABLE CHEST 1 VIEW COMPARISON:  Chest x-ray a 10/08/2016. FINDINGS: Lung volumes are low. Opacity multifocal interstitial and airspace disease, most confluent in the left lower lobe. No definite pleural effusions. Heart size is borderline enlarged, likely accentuated by low lung volumes. Pulmonary vasculature does not appear engorged. Upper mediastinal contours are distorted by patient positioning. IMPRESSION: 1. Patchy asymmetrically distributed multifocal interstitial and airspace disease most confluent in the left lower lobe, concerning for multilobar  bronchopneumonia. Electronically Signed   By: DaniVinnie Langton.   On: 10/12/2016 10:45   Dg Shoulder Left  Result Date: 10/09/2016 CLINICAL DATA:  Left neck pain.  Arm pain.  Left hand swelling . EXAM: LEFT SHOULDER - 2+ VIEW COMPARISON:  Chest x-ray 10/08/2016 FINDINGS: No acute bony or joint abnormality identified. No evidence of fracture or dislocation. IMPRESSION: No acute or focal abnormality. Electronically Signed   By: ThomMarcello Mooresgister   On: 10/09/2016 13:42   Dg Humerus Left  Result Date: 10/30/2016 CLINICAL DATA:  Left humerus pain beginning this morning. No specific injury. EXAM: LEFT HUMERUS - 2+ VIEW COMPARISON:  None. FINDINGS: Mottled sclerotic bones diffusely correlating with history of myeloproliferative disease. These changes have been seen elsewhere in the skeleton since at least 2015 chest CT. No acute fracture or focal bony erosion. No acute soft tissue finding. There is question of 13  mm nodule over the left mid lung, but none seen on dedicated chest x-ray from 2 weeks prior. IMPRESSION: 1. No acute finding. 2. Chronic sclerotic and permeative appearance of the skeleton correlating with history of myeloproliferative disease. Electronically Signed   By: Monte Fantasia M.D.   On: 10/30/2016 17:21   Dg Hand Complete Left  Result Date: 10/30/2016 CLINICAL DATA:  Generalized left upper extremity pain. EXAM: LEFT HAND - COMPLETE 3+ VIEW COMPARISON:  None. FINDINGS: Diffuse osteopenia. No evidence of fracture or dislocation. Osteoarthritic changes at the radiocarpal joint, intercarpal joints and first carpometacarpal joint. IMPRESSION: No acute fracture or dislocation identified about the left head. Multilevel osteoarthritic changes. Electronically Signed   By: Fidela Salisbury M.D.   On: 10/30/2016 17:23   Dg Fluoro Guided Needle Plc Aspiration/injection Loc  Result Date: 11/02/2016 CLINICAL DATA:  76 year old male with diffuse left wrist pain and swelling. Aspiration requested  to evaluate for gout versus infection. FLUOROSCOPY TIME:  0 minutes 18 seconds for a total dose of 0.4 mGy PROCEDURE: LEFT WRIST ASPIRATION After a thorough discussion of risks and benefits of the procedure, written and oral informed consent was obtained. The consent discussion included the risk of bleeding, infection and injury to nerves and adjacent blood vessels. Extra-articular injection was also a possible risk discussed. Verbal consent was obtained by Dr. Laurence Ferrari. Time out form Completed when appropriate. Preliminary localization was performed over the left wrist. The area was marked over dorsal scaphoid. After prep and drape in the usual sterile fashion, the skin and deeper subcutaneous tissues were anesthetized with 1% Lidocaine without Epinephrine. Under fluoroscopic guidance, a 19 gauge hypodermic needle was advanced into the joint between distal radius using a dorsal approach. Aspiration was performed yielding an no fluid. Therefore, a small volume of sterile saline was injected into the joint space without resistance. Aspiration was then performed an approximately 1 mL of the injected fluid was successfully re- aspirated. This was sent to the laboratory for further evaluation. The patient tolerated the procedure well and there were no complications. IMPRESSION: Successful left wrist fluoroscopically guided aspiration. Electronically Signed   By: Jacqulynn Cadet M.D.   On: 11/02/2016 15:58    Time Spent in minutes  30   Lala Lund M.D on 11/03/2016 at 10:20 AM  Between 7am to 7pm - Pager - [phone] ( page via [email or web address], text pages only, please mention full 10 digit call back number). After 7pm go to [email or web address] - password [facility]

## 2016-11-03 NOTE — Progress Notes (Signed)
Received all from MRI they are on their way to get patient.  OK to give Ativan now.

## 2016-11-03 NOTE — Evaluation (Signed)
Physical Therapy Evaluation Patient Details Name: Juan [name] MRN: [id number] DOB: Oct 01, 1940 Today's Date: 11/03/2016   History of Present Illness  Juan [name] is a 76 y.o. male with medical history significant for but not limited to myeloproliferative disorder presenting with several days history of progressively worsening severe left wrist aching pain radiating to ipsilateral lower forearm associated with swelling of the left wrist, unable to touch it, not relieved by home remedy. 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 Pt currently with functional limitations due to the deficits listed below (see PT Problem List). Presents with pain L UE and overall fatigue, but agreeable to walk in hallways and seemed pleased with his walk;  Pt will benefit from skilled PT to increase their independence and safety with mobility to allow discharge to the venue listed below.       Follow Up Recommendations Home health PT    Equipment Recommendations  None recommended by PT    Recommendations for Other Services       Precautions / Restrictions Precautions Precautions: Fall      Mobility  Bed Mobility Overal bed mobility: Needs Assistance Bed Mobility: Supine to Sit     Supine to sit: Min assist     General bed mobility comments: Min handheld assist to pull to sit  Transfers Overall transfer level: Needs assistance Equipment used: Rolling walker (2 wheeled) Transfers: Sit to/from Stand Sit to Stand: Mod assist         General transfer comment: Light mod assist to power up to stand  Ambulation/Gait Ambulation/Gait assistance: Min guard (second person for O2) Ambulation Distance (Feet): 100 Feet Assistive device: Rolling walker (2 wheeled) Gait Pattern/deviations: Step-through pattern;Trunk flexed     General Gait Details: Cues to  self-monitor for activity tolerance; tired at end of walk  Stairs            Wheelchair Mobility    Modified Rankin (Stroke Patients Only)       Balance     Sitting balance-Leahy Scale: Fair       Standing balance-Leahy Scale: Poor                               Pertinent Vitals/Pain Pain Assessment: 0-10 Pain Score: 9  Faces Pain Scale: Hurts little more Pain Location: L wrist Pain Descriptors / Indicators: Aching;Grimacing;Guarding Pain Intervention(s): Monitored during session    Home Living Family/patient expects to be discharged to:: Private residence Living Arrangements: Children Available Help at Discharge: Family;Available 24 hours/day Type of Home: Apartment Home Access: Level entry     Home Layout: One level Home Equipment: Walker - 2 wheels;Cane - single point;Shower seat;Bedside commode;Wheelchair - manual Additional Comments: pt lives with his son    Prior Function Level of Independence: Needs assistance   Gait / Transfers Assistance Needed: with Rw or cane  ADL's / Homemaking Assistance Needed: Pt's son performs housekeeping and cooking. Pt states he is able to bathe, dress and toilet himself at baseline.         Hand Dominance   Dominant Hand: Right    Extremity/Trunk Assessment   Upper Extremity Assessment Upper Extremity Assessment: Defer to OT evaluation    Lower Extremity Assessment Lower Extremity Assessment: Generalized weakness    Cervical / Trunk Assessment Cervical / Trunk Assessment: Kyphotic  Communication   Communication: No difficulties  Cognition Arousal/Alertness: Awake/alert Behavior During Therapy: WFL for tasks assessed/performed Overall Cognitive Status: Within Functional Limits for tasks assessed                                        General Comments      Exercises     Assessment/Plan    PT Assessment Patient needs continued PT services  PT Problem List Decreased  strength;Decreased activity tolerance;Decreased balance;Decreased mobility;Decreased knowledge of use of DME;Pain       PT Treatment Interventions DME instruction;Gait training;Stair training;Functional mobility training;Therapeutic activities;Therapeutic exercise;Balance training;Patient/family education    PT Goals (Current goals can be found in the Care Plan section)  Acute Rehab PT Goals Patient Stated Goal: to feel better PT Goal Formulation: With patient Time For Goal Achievement: 11/17/16 Potential to Achieve Goals: Fair    Frequency Min 3X/week   Barriers to discharge        Co-evaluation               AM-PAC PT "6 Clicks" Daily Activity  Outcome Measure Difficulty turning over in bed (including adjusting bedclothes, sheets and blankets)?: Total Difficulty moving from lying on back to sitting on the side of the bed? : Total Difficulty sitting down on and standing up from a chair with arms (e.g., wheelchair, bedside commode, etc,.)?: A Lot Help needed moving to and from a bed to chair (including a wheelchair)?: A Little Help needed walking in hospital room?: A Little Help needed climbing 3-5 steps with a railing? : A Lot 6 Click Score: 12    End of Session Equipment Utilized During Treatment: Gait belt;Oxygen Activity Tolerance: Patient tolerated treatment well Patient left: in bed;with call Hreha/phone within reach;with nursing/sitter in room Nurse Communication: Mobility status PT Visit Diagnosis: Unsteadiness on feet (R26.81);Muscle weakness (generalized) (M62.81);Pain Pain - Right/Left: Left Pain - part of body: Arm;Hand;Shoulder    Time: 8592-9244 PT Time Calculation (min) (ACUTE ONLY): 27 min   Charges:   PT Evaluation $PT Eval Low Complexity: 1 Procedure PT Treatments $Gait Training: 8-22 mins   PT G Codes:        Roney Marion, PT  Acute Rehabilitation Services Pager [phone] Office [phone]   [place] 11/03/2016, 11:07 AM

## 2016-11-03 NOTE — Evaluation (Signed)
Occupational Therapy Evaluation Patient Details Name: Juan [name] MRN: [id number] DOB: Mar 30, 1941 Today's Date: 11/03/2016    History of Present Illness Juan [name] is a 76 y.o. male with medical history significant for but not limited to myeloproliferative disorder presenting with several days history of progressively worsening severe left wrist aching pain radiating to ipsilateral lower forearm associated with swelling of the left wrist, unable to touch it, not relieved by home remedy. Patient was admitted for acute CHF and left upper extremity pain; likely acute gouty arthritis; working diagnosis of pneumonia as well   Clinical Impression   Patient presenting with decreased I in self care, balance, functional transfers, strength, endurance, and safety awareness. Patient reports being mod I  PTA. Patient currently functioning at min - max A. Patient will benefit from acute OT to increase overall independence in the areas of ADLs, functional mobility, and safety awareness in order to safely discharge home with family.    Follow Up Recommendations  Home health OT;Supervision/Assistance - 24 hour    Equipment Recommendations  3 in 1 bedside commode    Recommendations for Other Services       Precautions / Restrictions Precautions Precautions: Fall Restrictions Weight Bearing Restrictions: No      Mobility Bed Mobility Overal bed mobility: Needs Assistance Bed Mobility: Supine to Sit     Supine to sit: Min assist     General bed mobility comments: Min handheld assist to pull to sit  Transfers Overall transfer level: Needs assistance   Transfers: Sit to/from Stand Sit to Stand: Mod assist              Balance Overall balance assessment: Needs assistance Sitting-balance support: No upper extremity supported;Feet supported       Standing balance support: During functional activity;Bilateral upper extremity supported Standing balance-Leahy Scale: Poor                              ADL either performed or assessed with clinical judgement   ADL Overall ADL's : Needs assistance/impaired                         Toilet Transfer: Maximal assistance;BSC   Toileting- Clothing Manipulation and Hygiene: Maximal assistance;Sit to/from stand         General ADL Comments: Upon entering the room, condom cath off in bed and urine on sheets. Pt transferring from bed >BSC with lifting and lowering assistance. Pt needing assistance with clothing management and hygiene after BM.      Vision         Perception     Praxis      Pertinent Vitals/Pain Pain Assessment: 0-10 Pain Score: 4  Pain Location: L wrist Pain Descriptors / Indicators: Aching;Grimacing;Guarding Pain Intervention(s): Repositioned;Monitored during session     Hand Dominance Right   Extremity/Trunk Assessment Upper Extremity Assessment Upper Extremity Assessment: Generalized weakness   Lower Extremity Assessment Lower Extremity Assessment: Generalized weakness   Cervical / Trunk Assessment Cervical / Trunk Assessment: Kyphotic   Communication Communication Communication: No difficulties   Cognition Arousal/Alertness: Awake/alert Behavior During Therapy: WFL for tasks assessed/performed Overall Cognitive Status: Within Functional Limits for tasks assessed                                     General Comments  Exercises     Shoulder Instructions      Home Living Family/patient expects to be discharged to:: Private residence Living Arrangements: Children Available Help at Discharge: Family;Available 24 hours/day Type of Home: Apartment Home Access: Level entry     Home Layout: One level     Bathroom Shower/Tub: Teacher, early years/pre: Standard     Home Equipment: Environmental consultant - 2 wheels;Cane - single point;Shower seat;Bedside commode;Wheelchair - manual   Additional Comments: pt lives with his son      Prior  Functioning/Environment Level of Independence: Needs assistance  Gait / Transfers Assistance Needed: with Rw or cane ADL's / Homemaking Assistance Needed: Pt's son performs housekeeping and cooking. Pt states he is able to bathe, dress and toilet himself at baseline.             OT Problem List: Decreased strength;Decreased range of motion;Decreased activity tolerance;Impaired balance (sitting and/or standing);Decreased safety awareness;Pain;Decreased knowledge of use of DME or AE;Decreased knowledge of precautions      OT Treatment/Interventions: Self-care/ADL training;Therapeutic exercise;Neuromuscular education;Energy conservation;DME and/or AE instruction;Cognitive remediation/compensation;Therapeutic activities;Balance training;Patient/family education    OT Goals(Current goals can be found in the care plan section) Acute Rehab OT Goals Patient Stated Goal: to feel better OT Goal Formulation: With patient Time For Goal Achievement: 11/17/16 Potential to Achieve Goals: Good ADL Goals Pt Will Perform Grooming: with modified independence Pt Will Perform Upper Body Bathing: with set-up;with supervision Pt Will Perform Lower Body Bathing: with set-up;with supervision Pt Will Perform Upper Body Dressing: with set-up;with supervision Pt Will Perform Lower Body Dressing: with min assist Pt Will Transfer to Toilet: with min assist Pt Will Perform Toileting - Clothing Manipulation and hygiene: with min assist  OT Frequency: Min 2X/week   Barriers to D/C:    none known at this time       Co-evaluation              AM-PAC PT "6 Clicks" Daily Activity     Outcome Measure Help from another person eating meals?: A Little Help from another person taking care of personal grooming?: A Little Help from another person toileting, which includes using toliet, bedpan, or urinal?: Total Help from another person bathing (including washing, rinsing, drying)?: A Lot Help from another person  to put on and taking off regular upper body clothing?: A Little Help from another person to put on and taking off regular lower body clothing?: A Lot 6 Click Score: 14   End of Session Equipment Utilized During Treatment: Other (comment) (BSC)  Activity Tolerance: Patient tolerated treatment well Patient left: in bed;with call [place]/phone within reach;with nursing/sitter in room  OT Visit Diagnosis: Muscle weakness (generalized) (M62.81)                Time: 3734-2876 OT Time Calculation (min): 29 min Charges:  OT General Charges $OT Visit: 1 Procedure OT Evaluation $OT Eval Moderate Complexity: 1 Procedure OT Treatments $Self Care/Home Management : 8-22 mins G-Codes:      Gypsy Decant 11-18-16, 4:02 PM

## 2016-11-04 ENCOUNTER — Telehealth (HOSPITAL_COMMUNITY): Payer: Self-pay

## 2016-11-04 DIAGNOSIS — J189 Pneumonia, unspecified organism: Principal | ICD-10-CM

## 2016-11-04 DIAGNOSIS — M79609 Pain in unspecified limb: Secondary | ICD-10-CM

## 2016-11-04 DIAGNOSIS — M1990 Unspecified osteoarthritis, unspecified site: Secondary | ICD-10-CM

## 2016-11-04 LAB — CBC
HEMATOCRIT: 30 % — AB (ref 39.0–52.0)
HEMOGLOBIN: 9.2 g/dL — AB (ref 13.0–17.0)
MCH: 25.3 pg — AB (ref 26.0–34.0)
MCHC: 30.7 g/dL (ref 30.0–36.0)
MCV: 82.6 fL (ref 78.0–100.0)
Platelets: 439 10*3/uL — ABNORMAL HIGH (ref 150–400)
RBC: 3.63 MIL/uL — AB (ref 4.22–5.81)
RDW: 18.6 % — ABNORMAL HIGH (ref 11.5–15.5)
WBC: 9.9 10*3/uL (ref 4.0–10.5)

## 2016-11-04 LAB — BASIC METABOLIC PANEL
Anion gap: 8 (ref 5–15)
BUN: 92 mg/dL — ABNORMAL HIGH (ref 6–20)
CHLORIDE: 104 mmol/L (ref 101–111)
CO2: 25 mmol/L (ref 22–32)
CREATININE: 1.63 mg/dL — AB (ref 0.61–1.24)
Calcium: 8.9 mg/dL (ref 8.9–10.3)
GFR calc non Af Amer: 40 mL/min — ABNORMAL LOW (ref >60)
GFR, EST AFRICAN AMERICAN: 46 mL/min — AB (ref >60)
Glucose, Bld: 95 mg/dL (ref 65–99)
Potassium: 4.9 mmol/L (ref 3.5–5.1)
SODIUM: 137 mmol/L (ref 135–145)

## 2016-11-04 LAB — GLUCOSE, CAPILLARY
GLUCOSE-CAPILLARY: 99 mg/dL (ref 65–99)
GLUCOSE-CAPILLARY: 129 mg/dL — AB (ref 65–99)
Glucose-Capillary: 110 mg/dL — ABNORMAL HIGH (ref 65–99)

## 2016-11-04 MED ORDER — AMOXICILLIN-POT CLAVULANATE 500-125 MG PO TABS: 500.0000 mg | ORAL_TABLET | Freq: Two times a day (BID) | ORAL | 0 refills | Status: DC

## 2016-11-04 MED ORDER — HYDROCODONE-ACETAMINOPHEN 5-325 MG PO TABS: 1.0000 | ORAL_TABLET | Freq: Four times a day (QID) | ORAL | 0 refills | Status: DC | PRN

## 2016-11-04 MED ORDER — COLCHICINE 0.6 MG PO TABS: 0.6000 mg | ORAL_TABLET | Freq: Every day | ORAL | 0 refills | Status: DC | PRN

## 2016-11-04 NOTE — Progress Notes (Signed)
Per chart review and after phone call from [place] with [facility]-patient is scheduled for appt tomorrow with [facility].  He is to be discharged from the hospital today.  I will contact [facility] to see the patient as soon as possible and to insure that he plans to keep or reschedule his appt.

## 2016-11-04 NOTE — Discharge Summary (Signed)
Physician Discharge Summary  Juan [name] NFA:[id number] DOB: 05-07-1941 DOA: 10/31/2016  PCP: Colon Branch, MD  Admit date: 10/31/2016 Discharge date: 11/04/2016  Recommendations for Outpatient Follow-up:  1. Pt will need to follow up with PCP in 1-2 weeks post discharge 2. Please obtain BMP to evaluate electrolytes and kidney function 3. Please also check CBC to evaluate Hg and Hct levels  Discharge Diagnoses:  Principal Problem:   Recurrent pneumonia Active Problems:   Acute extremity pain   HCAP (healthcare-associated pneumonia)  Discharge Condition: Stable  Diet recommendation: Heart healthy diet discussed in details   Brief Narrative   76 y.o.malewith known myeloproliferative disorder presentied with several days history of progressively worsening severe left wrist aching pain radiating to ipsilateral lower forearm associated with swelling of the left wrist, unable to touch it, not relieved by home remedy.    Assessment  & Plan :     1. Left wrist acute gouty arthritis.  - uric acid 11, placed on Solumedrol and colchicine - Dr. Candiss Norse d/w Dr. Apolonio Schneiders and Dr. Alvan Dame, had nothing else to offer - IR doctor attempted US guided joint aspiration on 6/18 and fluid was unimpressive without any crystals - continue colchicine on discharge with outpatient follow up   2. Chronic diastolic CHF. EF 60%. - currently compensated - resume home medical regimen with diuretic and close outpatient follow up   3. Essential hypertension. - resume home medical regimen   4. Left frozen shoulder - HH PT/OT set up - outpatient follow up recommended   5. Agnogenic myeloid metaplasia  - Has seen Dr. Marin Olp in the past (consult note 05/07/16 reviewed). Apparently he has refused a bone marrow biopsy and does not want any treatment for this.  6. ARF on CKD 3.  - baseline Cr around 1.5 - 1.7 - diuretic held during the hospital stay but can be resumed on discharged as Cr is now at baseline    7. Pneumonia. - transitioned to oral Augmentin on discharge to complete therapy   8. Hyperkalemia - resolved   9. DM type II with complications of nephropathy  - resume home medial regimen which for now is diet control - A1C < 6  Family Communication  :  Pt at bedside   Code Status :  Full  Disposition Plan  :  Home  Consults  :  Hand Surgery  Procedures  :    Left wrist ultrasound-guided aspiration requested through IR.  Procedures/Studies: Dg Chest 2 View  Result Date: 11/02/2016 CLINICAL DATA:  Nonproductive cough and shortness of breath intermittently for the past month. History of hypertension, diabetes, coronary artery disease, and CHF. Former smoker. EXAM: CHEST  2 VIEW COMPARISON:  Chest x-ray and October 31, 2016 and Oct 08, 2016. FINDINGS: The right hemidiaphragm remains higher than the left. The interstitial markings of both lungs are increased. The areas of confluent density seen on the earlier study are less conspicuous today. The cardiac silhouette remains enlarged. The pulmonary vascularity is mildly prominent. There is no pleural effusion. IMPRESSION: Mild interval improvement in the appearance of the pulmonary interstitium. Persistent interstitial edema or pneumonia is present. Electronically Signed   By: David  Martinique M.D.   On: 11/02/2016 15:37   Dg Chest 2 View  Result Date: 10/31/2016 CLINICAL DATA:  Arm pain. EXAM: CHEST  2 VIEW COMPARISON:  10/15/2016. FINDINGS: There is cardiac enlargement. Decreased lung volumes. Diffuse bilateral interstitial and airspace opacities are identified which may reflect pulmonary edema or multifocal infection.  Diffuse abnormal sclerosis of the visualized osseous structures again noted. IMPRESSION: 1. Bilateral interstitial and airspace opacities which may reflect pulmonary edema and/or infection. 2. Diffuse osteosclerosis noted. Electronically Signed   By: Kerby Moors M.D.   On: 10/31/2016 09:09   Dg Chest 2  View  Result Date: 10/15/2016 CLINICAL DATA:  Follow-up pneumonia. Current history of CHF, stage 3 chronic kidney disease, myeloid metaplasia/myeloproliferative disease. EXAM: CHEST  2 VIEW COMPARISON:  10/12/2016, 10/08/2016 and earlier including CT chest 04/01/2016. FINDINGS: Cardiac silhouette markedly enlarged, unchanged. Pulmonary venous hypertension without evidence of pulmonary edema currently. Improved aeration in the lower lobes since the examination 3 days ago with only mild right lower lobe opacity persisting. No new pulmonary parenchymal abnormalities. Diffuse osteosclerosis as noted previously. IMPRESSION: Improved bilateral pneumonia since the examination 3 days ago with only minimal right lower lobe opacity persisting. No new abnormalities. Stable cardiomegaly without pulmonary edema currently. Electronically Signed   By: Evangeline Dakin M.D.   On: 10/15/2016 20:04   Dg Chest 2 View  Result Date: 10/08/2016 CLINICAL DATA:  Fever, cough, shortness of breath, numbness down the left arm. EXAM: CHEST  2 VIEW COMPARISON:  05/05/2016 FINDINGS: Shallow inspiration with atelectasis in the lung bases. Cardiac enlargement with mild pulmonary vascular congestion. Hazy perihilar opacities may represent mild pulmonary edema. No pleural effusions. No pneumothorax. Mild thoracic scoliosis convex towards the right. IMPRESSION: Cardiac enlargement with mild pulmonary vascular congestion and probable mild perihilar edema. Shallow inspiration with atelectasis in the lung bases. Electronically Signed   By: Lucienne Capers M.D.   On: 10/08/2016 04:56   Dg Forearm Left  Result Date: 10/30/2016 CLINICAL DATA:  Arm pain beginning today. EXAM: LEFT FOREARM - 2 VIEW COMPARISON:  None. FINDINGS: Diffuse sclerotic and permeative appearance of the visualized skeleta, present elsewhere in the skeleton by chest CT since at least 2015. No superimposed fracture or malalignment. No suspected elbow joint effusion.  IMPRESSION: 1. No acute finding. 2. Chronic sclerotic and permeative appearance of the skeleton in this patient with history of myeloproliferative disease. Electronically Signed   By: Monte Fantasia M.D.   On: 10/30/2016 17:22   Mr Cervical Spine Wo Contrast  Result Date: 10/09/2016 CLINICAL DATA:  Myeloproliferative disorder with left arm pain. EXAM: MRI CERVICAL SPINE WITHOUT CONTRAST TECHNIQUE: Multiplanar, multisequence MR imaging of the cervical spine was performed. No intravenous contrast was administered. COMPARISON:  1. Bone scan 04/17/2016 2. Chest CT 04/01/2016 FINDINGS: Alignment: Normal Vertebrae: There is low T1 weighted signal throughout the bone marrow with heterogeneous areas of high T2 weighted signal. No discrete focal marrow lesion. Cord: Normal Posterior Fossa, vertebral arteries, paraspinal tissues: Visualized posterior fossa is normal. Vertebral artery flow voids are preserved. Normal visualized paraspinal soft tissues. Disc levels: C1-C2: Normal. C2-C3: Disc desiccation without significant bulge.  No stenosis. C3-C4: Disc space narrowing with mild uncovertebral hypertrophy. No stenosis. C4-C5: Mild bilateral uncovertebral and facet hypertrophy without associated stenosis. C5-C6: Disc desiccation with small bulge narrowing the ventral thecal sac. Mild bilateral uncovertebral hypertrophy. No stenosis. C6-C7: Disc desiccation with narrowing of the disc space and type 2 Modic degenerative endplate changes. C7-T1: Normal disc space and facets. No spinal canal or neuroforaminal stenosis. Within the medial aspect of the left T2 transverse process, there is a hyperintense T2 weighted signal lesion measuring 8 mm, seen on coronal image 5. IMPRESSION: 1. 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 No specific finding to explain the left arm pain.  2. Diffuse heterogeneous bone marrow signal which is compatible with the  patient's known myeloproliferative disorder. 3. Focal T2 hyperintense lesion in the left transverse process of T2, seen only on the coronal sequence and thus incompletely evaluated, but unchanged compared to the chest CT of 04/01/2016. Electronically Signed   By: Ulyses Jarred M.D.   On: 10/09/2016 14:00   Ct Wrist Left Wo Contrast  Result Date: 11/03/2016 CLINICAL DATA:  Left wrist pain and swelling. EXAM: CT OF THE LEFT WRIST WITHOUT CONTRAST TECHNIQUE: Multidetector CT imaging was performed according to the standard protocol. Multiplanar CT image reconstructions were also generated. COMPARISON:  None. FINDINGS: The study is nondiagnostic. The patient's wrist had to be scanned at the patient's side due to limitation of patient positioning. In that position, image quality is nondiagnostic. IMPRESSION: CT scan of the left wrist is nondiagnostic. The patient will not be charged for this procedure. Electronically Signed   By: Lorriane Shire M.D.   On: 11/03/2016 09:52   Mr Wrist Left Wo Contrast  Result Date: 11/03/2016 CLINICAL DATA:  Progressively worsening of left wrist pain and aching radiating into the left forearm with swelling over the past several days. History of myeloproliferative disorder. EXAM: MR OF THE LEFT WRIST WITHOUT CONTRAST TECHNIQUE: Multiplanar, multisequence MR imaging of the left wrist was performed. No intravenous contrast was administered. COMPARISON:  Plain films of the left forearm and hand 10/30/2016. FINDINGS: Please note that this study was ordered with contrast material but the patient was unable with to tolerate further scanning so contrast was not administered. No sagittal imaging is provided. Ligaments: Dorsal band of the scapholunate ligament is well visualized and intact. The volar band of the lunotriquetral ligament appears intact. There are tears of the interstitial portions of both the scapholunate and lunotriquetral ligaments. Small effusion in the distal radioulnar  joint is identified. Triangular fibrocartilage: Tear is seen through the disc of the triangular fibrocartilage. Tendons: Intact. Carpal tunnel/median nerve: Negative. Guyon's canal: Negative. Joint/cartilage: There is moderate appearing first CMC osteoarthritis. Bones/carpal alignment: Innumerable punctate foci of abnormal T1 signal are identified in all imaged bones with corresponding mildly increased T2 signal. No fracture. Other: No focal fluid collection. Subcutaneous edema is present about the dorsum of the hand and wrist. Imaged musculature also appears somewhat edematous. IMPRESSION: Soft tissue edema about the wrist is presumably due to dependent change or volume overload. Innumerable foci of abnormal marrow signal consistent with this patient's history of myeloproliferative disease or multiple myeloma. Negative for fracture or other acute bony abnormality. Tear of the disc of the triangular fibrocartilage. Tearing of interstitial portions of both the scapholunate and lunotriquetral ligaments. The ligaments are otherwise intact. Electronically Signed   By: Inge Rise M.D.   On: 11/03/2016 14:02   Mr Shoulder Left W Wo Contrast  Result Date: 10/14/2016 CLINICAL DATA:  Left all arm and shoulder pain scratch the left arm and shoulder pain with weakness. History of myeloproliferative disorder. Fever. EXAM: MRI OF THE LEFT SHOULDER WITHOUT AND WITH CONTRAST TECHNIQUE: Multiplanar, multisequence MR imaging of the left shoulder was performed before and after the administration of intravenous contrast. CONTRAST:  8 ml MULTIHANCE GADOBENATE DIMEGLUMINE 529 MG/ML IV SOLN COMPARISON:  Plain films left shoulder 10/09/2016. FINDINGS: Patient motion degrades the exam. Rotator cuff: The patient has supraspinatus and infraspinatus tendinopathy. There is a partial width, full-thickness tear of the posterior fibers of the supraspinatus measuring approximately 0.7 cm from front to back with retraction of 1-2 cm. The  rotator  cuff is otherwise intact. Muscles:  No focal atrophy or lesion. Biceps long head:  Intact. Acromioclavicular Joint: Mild degenerative change is seen. Type 1 acromion. No subacromial/subdeltoid bursal fluid. Glenohumeral Joint: Negative. Labrum:  Grossly intact. Bones: Abnormal marrow signal is present in all imaged bones. No fracture is identified. No evidence of osteomyelitis. Other: None. IMPRESSION: Abnormal marrow signal in all imaged bones consistent with the patient's history of myeloproliferative disorder, multiple myeloma or less likely metastatic disease. Negative for evidence of infectious process. Rotator cuff tendinopathy with a 0.7 cm full-thickness tear of the posterior supraspinatus with mild retraction and no atrophy. Electronically Signed   By: Inge Rise M.D.   On: 10/14/2016 09:28   Nm Pulmonary Perf And Vent  Result Date: 10/15/2016 CLINICAL DATA:  Short of breath. Concern pulmonary embolism. Pulmonary hypertension. EXAM: NUCLEAR MEDICINE VENTILATION - PERFUSION LUNG SCAN TECHNIQUE: Ventilation images were obtained in multiple projections using inhaled aerosol Tc-55mDTPA. Perfusion images were obtained in multiple projections after intravenous injection of Tc-96mAA. LAO/RPO Oblique projections not performed. Patient became nauseous and vomited. RADIOPHARMACEUTICALS:  30.0 mCi Technetium-9939mPA aerosol inhalation and 4.0 mCi Technetium-1m108m IV COMPARISON:  Chest radiograph 10/15/2016 FINDINGS: Ventilation: No focal ventilation defect. Elevation RIGHT hemidiaphragm Perfusion: No wedge shaped peripheral perfusion defects to suggest acute pulmonary embolism. IMPRESSION: No evidence acute or chronic pulmonary embolism. Electronically Signed   By: StewSuzy Bouchard.   On: 10/15/2016 17:29   Dg Chest Port 1 View  Result Date: 10/12/2016 CLINICAL DATA:  75 y45r old male with history of fever. EXAM: PORTABLE CHEST 1 VIEW COMPARISON:  Chest x-ray a 10/08/2016. FINDINGS:  Lung volumes are low. Opacity multifocal interstitial and airspace disease, most confluent in the left lower lobe. No definite pleural effusions. Heart size is borderline enlarged, likely accentuated by low lung volumes. Pulmonary vasculature does not appear engorged. Upper mediastinal contours are distorted by patient positioning. IMPRESSION: 1. Patchy asymmetrically distributed multifocal interstitial and airspace disease most confluent in the left lower lobe, concerning for multilobar bronchopneumonia. Electronically Signed   By: DaniVinnie Langton.   On: 10/12/2016 10:45   Dg Shoulder Left  Result Date: 10/09/2016 CLINICAL DATA:  Left neck pain.  Arm pain.  Left hand swelling . EXAM: LEFT SHOULDER - 2+ VIEW COMPARISON:  Chest x-ray 10/08/2016 FINDINGS: No acute bony or joint abnormality identified. No evidence of fracture or dislocation. IMPRESSION: No acute or focal abnormality. Electronically Signed   By: ThomMarcello Mooresgister   On: 10/09/2016 13:42   Dg Humerus Left  Result Date: 10/30/2016 CLINICAL DATA:  Left humerus pain beginning this morning. No specific injury. EXAM: LEFT HUMERUS - 2+ VIEW COMPARISON:  None. FINDINGS: Mottled sclerotic bones diffusely correlating with history of myeloproliferative disease. These changes have been seen elsewhere in the skeleton since at least 2015 chest CT. No acute fracture or focal bony erosion. No acute soft tissue finding. There is question of 13 mm nodule over the left mid lung, but none seen on dedicated chest x-ray from 2 weeks prior. IMPRESSION: 1. No acute finding. 2. Chronic sclerotic and permeative appearance of the skeleton correlating with history of myeloproliferative disease. Electronically Signed   By: JonaMonte Fantasia.   On: 10/30/2016 17:21   Dg Hand Complete Left  Result Date: 10/30/2016 CLINICAL DATA:  Generalized left upper extremity pain. EXAM: LEFT HAND - COMPLETE 3+ VIEW COMPARISON:  None. FINDINGS: Diffuse osteopenia. No evidence of  fracture or dislocation. Osteoarthritic changes at the radiocarpal joint, intercarpal joints and  first carpometacarpal joint. IMPRESSION: No acute fracture or dislocation identified about the left head. Multilevel osteoarthritic changes. Electronically Signed   By: Fidela Salisbury M.D.   On: 10/30/2016 17:23   Dg Fluoro Guided Needle Plc Aspiration/injection Loc  Result Date: 11/02/2016 CLINICAL DATA:  76 year old male with diffuse left wrist pain and swelling. Aspiration requested to evaluate for gout versus infection. FLUOROSCOPY TIME:  0 minutes 18 seconds for a total dose of 0.4 mGy PROCEDURE: LEFT WRIST ASPIRATION After a thorough discussion of risks and benefits of the procedure, written and oral informed consent was obtained. The consent discussion included the risk of bleeding, infection and injury to nerves and adjacent blood vessels. Extra-articular injection was also a possible risk discussed. Verbal consent was obtained by Dr. Laurence Ferrari. Time out form Completed when appropriate. Preliminary localization was performed over the left wrist. The area was marked over dorsal scaphoid. After prep and drape in the usual sterile fashion, the skin and deeper subcutaneous tissues were anesthetized with 1% Lidocaine without Epinephrine. Under fluoroscopic guidance, a 19 gauge hypodermic needle was advanced into the joint between distal radius using a dorsal approach. Aspiration was performed yielding an no fluid. Therefore, a small volume of sterile saline was injected into the joint space without resistance. Aspiration was then performed an approximately 1 mL of the injected fluid was successfully re- aspirated. This was sent to the laboratory for further evaluation. The patient tolerated the procedure well and there were no complications. IMPRESSION: Successful left wrist fluoroscopically guided aspiration. Electronically Signed   By: Jacqulynn Cadet M.D.   On: 11/02/2016 15:58     Discharge  Exam: Vitals:   11/04/16 0551 11/04/16 0929  BP: 125/68 123/69  Pulse: 78 65  Resp: 18 18  Temp: 98.6 F (37 C) 97.8 F (36.6 C)   Vitals:   11/03/16 1738 11/03/16 2124 11/04/16 0551 11/04/16 0929  BP: (!) 142/73 129/69 125/68 123/69  Pulse: 77 80 78 65  Resp: '18 18 18 18  '$ Temp: 98.4 F (36.9 C) 97.8 F (36.6 C) 98.6 F (37 C) 97.8 F (36.6 C)  TempSrc: Oral Oral Oral Oral  SpO2: 100% 98% 95% 99%  Weight:  93 kg (205 lb 1.6 oz)    Height:        General: Pt is alert, follows commands appropriately, not in acute distress Cardiovascular: Regular rate and rhythm, S1/S2 +, no murmurs, no rubs, no gallops Respiratory: Clear to auscultation bilaterally, no wheezing, no crackles, no rhonchi Abdominal: Soft, non tender, non distended, bowel sounds +, no guarding Extremities: Left wrist still swollen but pt says its better, less TTP Neuro: Grossly nonfocal  Discharge Instructions   Allergies as of 11/04/2016   No Known Allergies     Medication List    TAKE these medications   acetaminophen 500 MG tablet Commonly known as:  TYLENOL Take 500-1,000 mg by mouth every 6 (six) hours as needed for moderate pain or headache.   amoxicillin-clavulanate 500-125 MG tablet Commonly known as:  AUGMENTIN Take 1 tablet (500 mg total) by mouth every 12 (twelve) hours.   aspirin 325 MG tablet Take 325 mg by mouth daily.   bisacodyl 10 MG suppository Commonly known as:  DULCOLAX Place 1 suppository (10 mg total) rectally daily as needed for moderate constipation.   carvedilol 3.125 MG tablet Commonly known as:  COREG Take 1 tablet (3.125 mg total) by mouth 2 (two) times daily with a meal.   colchicine 0.6 MG tablet Take 1 tablet (  0.6 mg total) by mouth daily as needed.   diclofenac sodium 1 % Gel Commonly known as:  VOLTAREN Apply 4 g topically 2 (two) times daily as needed (pain). Apply to bilateral  knees   docusate sodium 100 MG capsule Commonly known as:  COLACE Take 1  capsule (100 mg total) by mouth 2 (two) times daily as needed for mild constipation.   feeding supplement (GLUCERNA SHAKE) Liqd Take 118.5 mLs by mouth daily. Reported on 08/21/2015   FERROUS GLUCONATE PO Take 240 mg by mouth daily at 10 pm. 1 tablet   fluticasone 50 MCG/ACT nasal spray Commonly known as:  FLONASE Place 2 sprays into both nostrils daily. Reported on 05/02/2015   gabapentin 100 MG capsule Commonly known as:  NEURONTIN Take 2 capsules (200 mg total) by mouth 3 (three) times daily as needed (pain). Reported on 08/22/2015   hydrALAZINE 25 MG tablet Commonly known as:  APRESOLINE Take 25 mg by mouth 3 (three) times daily. What changed:  Another medication with the same name was removed. Continue taking this medication, and follow the directions you see here.   hydrocerin Crea Apply Eucerin cream to BLE Q day after bathing and roughly towel drying to remove loose skin What changed:  how much to take  how to take this  when to take this  reasons to take this  additional instructions   HYDROcodone-acetaminophen 5-325 MG tablet Commonly known as:  NORCO/VICODIN Take 1-2 tablets by mouth every 6 (six) hours as needed for moderate pain.   isosorbide mononitrate 30 MG 24 hr tablet Commonly known as:  IMDUR Take 30 mg by mouth 2 (two) times daily.   lidocaine 5 % ointment Commonly known as:  XYLOCAINE Apply topically 3 (three) times daily.   methocarbamol 750 MG tablet Commonly known as:  ROBAXIN Take 1 tablet (750 mg total) by mouth 3 (three) times daily.   metolazone 2.5 MG tablet Commonly known as:  ZAROXOLYN Take 1 tablet (2.5 mg total) by mouth as directed. What changed:  when to take this   OXYGEN Inhale 2 L into the lungs continuous.   potassium chloride SA 20 MEQ tablet Commonly known as:  K-DUR,KLOR-CON Take 20 mEq by mouth daily. Taken with metolazone.   simethicone 80 MG chewable tablet Commonly known as:  MYLICON Chew 80 mg by mouth every 6  (six) hours as needed for flatulence.   torsemide 20 MG tablet Commonly known as:  DEMADEX Take 4 tablets (80 mg total) by mouth daily.   VITAMIN B COMPLEX PO Take 100 mg by mouth daily.            Durable Medical Equipment        Start     Ordered   11/04/16 1045  For home use only DME Bedside commode  Once    Question:  Patient needs a bedside commode to treat with the following condition  Answer:  Weakness   11/04/16 1044     Follow-up Information    Colon Branch, MD Follow up.   Specialty:  Internal Medicine Contact information: [address] [place] [address] [phone]            The results of significant diagnostics from this hospitalization (including imaging, microbiology, ancillary and laboratory) are listed below for reference.     Microbiology: Recent Results (from the past 240 hour(s))  Culture, blood (routine x 2)     Status: None (Preliminary result)  Collection Time: 10/31/16  8:00 AM  Result Value Ref Range Status   Specimen Description BLOOD RIGHT ANTECUBITAL  Final   Special Requests   Final    BOTTLES DRAWN AEROBIC AND ANAEROBIC Blood Culture adequate volume   Culture NO GROWTH 3 DAYS  Final   Report Status PENDING  Incomplete  Culture, blood (routine x 2)     Status: None (Preliminary result)   Collection Time: 10/31/16  8:10 AM  Result Value Ref Range Status   Specimen Description BLOOD RIGHT HAND  Final   Special Requests   Final    BOTTLES DRAWN AEROBIC ONLY Blood Culture adequate volume   Culture NO GROWTH 3 DAYS  Final   Report Status PENDING  Incomplete  Body fluid culture     Status: None (Preliminary result)   Collection Time: 11/02/16  4:19 PM  Result Value Ref Range Status   Specimen Description SYNOVIAL LEFT WRIST  Final   Special Requests NONE  Final   Gram Stain   Final    ABUNDANT WBC PRESENT, PREDOMINANTLY PMN NO ORGANISMS SEEN    Culture NO GROWTH 2 DAYS  Final   Report Status PENDING   Incomplete     Labs: Basic Metabolic Panel:  Recent Labs Lab 10/31/16 0810 11/01/16 0638 11/02/16 0646 11/03/16 0547 11/04/16 0513  NA 138 140 135 134* 137  K 3.8 4.2 5.0 5.9* 4.9  CL 102 105 102 102 104  CO2 '25 28 22 23 25  '$ GLUCOSE 125* 100* 200* 171* 95  BUN 72* 70* 81* 94* 92*  CREATININE 1.39* 1.42* 1.67* 1.84* 1.63*  CALCIUM 9.8 9.1 9.3 8.9 8.9   Liver Function Tests:  Recent Labs Lab 10/31/16 0810  AST 22  ALT 13*  ALKPHOS 112  BILITOT 0.6  PROT 6.4*  ALBUMIN 3.0*   CBC:  Recent Labs Lab 10/31/16 0810 11/01/16 0638 11/02/16 0646 11/03/16 0547 11/04/16 0513  WBC 11.8* 6.7 9.4 9.2 9.9  NEUTROABS 6.4  --   --   --   --   HGB 10.2* 8.5* 9.6* 9.1* 9.2*  HCT 33.4* 27.9* 31.3* 29.4* 30.0*  MCV 83.5 82.5 83.7 81.2 82.6  PLT 582* 362 447* 363 439*   Cardiac Enzymes:  Recent Labs Lab 10/31/16 1841 11/01/16 0046 11/01/16 0638  TROPONINI 0.03* <0.03 <0.03   BNP: BNP (last 3 results)  Recent Labs  10/08/16 0436 10/12/16 1030 10/31/16 1605  BNP 328.5* 989.3* 389.5*   CBG:  Recent Labs Lab 11/03/16 0916 11/03/16 1153 11/03/16 1638 11/03/16 2123 11/04/16 0818  GLUCAP 191* 232* 119* 120* 99   SIGNED: Time coordinating discharge: 45 minutes   Faye Ramsay, MD  [facility] 11/04/2016, 10:47 AM Pager [phone]  If 7PM-7AM, please contact night-coverage [email or web address] Password [email or web address]

## 2016-11-04 NOTE — Progress Notes (Addendum)
CM consult placed for medication assistance. HH RN will assist with compliance at home. There is no financial assistance for his listed medications at this time.  [employer] information added to AVS. Patient may call [employer] at [phone] to see if he qualifies for low income assistance with Medicare.

## 2016-11-04 NOTE — Discharge Instructions (Signed)
Arthritis Arthritis is a term that is commonly used to refer to joint pain or joint disease. There are more than 100 types of arthritis. What are the causes? The most common cause of this condition is wear and tear of a joint. Other causes include:  Gout.  Inflammation of a joint.  An infection of a joint.  Sprains and other injuries near the joint.  A drug reaction or allergic reaction.  In some cases, the cause may not be known. What are the signs or symptoms? The main symptom of this condition is pain in the joint with movement. Other symptoms include:  Redness, swelling, or stiffness at a joint.  Warmth coming from the joint.  Fever.  Overall feeling of illness.  How is this diagnosed? This condition may be diagnosed with a physical exam and tests, including:  Blood tests.  Urine tests.  Imaging tests, such as MRI, X-rays, or a CT scan.  Sometimes, fluid is removed from a joint for testing. How is this treated? Treatment for this condition may involve:  Treatment of the cause, if it is known.  Rest.  Raising (elevating) the joint.  Applying cold or hot packs to the joint.  Medicines to improve symptoms and reduce inflammation.  Injections of a steroid such as cortisone into the joint to help reduce pain and inflammation.  Depending on the cause of your arthritis, you may need to make lifestyle changes to reduce stress on your joint. These changes may include exercising more and losing weight. Follow these instructions at home: Medicines  Take over-the-counter and prescription medicines only as told by your health care provider.  Do not take aspirin to relieve pain if gout is suspected. Activity  Rest your joint if told by your health care provider. Rest is important when your disease is active and your joint feels painful, swollen, or stiff.  Avoid activities that make the pain worse. It is important to balance activity with rest.  Exercise your  joint regularly with range-of-motion exercises as told by your health care provider. Try doing low-impact exercise, such as: ? Swimming. ? Water aerobics. ? Biking. ? Walking. Joint Care   If your joint is swollen, keep it elevated if told by your health care provider.  If your joint feels stiff in the morning, try taking a warm shower.  If directed, apply heat to the joint. If you have diabetes, do not apply heat without permission from your health care provider. ? Put a towel between the joint and the hot pack or heating pad. ? Leave the heat on the area for 20-30 minutes.  If directed, apply ice to the joint: ? Put ice in a plastic bag. ? Place a towel between your skin and the bag. ? Leave the ice on for 20 minutes, 2-3 times per day.  Keep all follow-up visits as told by your health care provider. This is important. Contact a health care provider if:  The pain gets worse.  You have a fever. Get help right away if:  You develop severe joint pain, swelling, or redness.  Many joints become painful and swollen.  You develop severe back pain.  You develop severe weakness in your leg.  You cannot control your bladder or bowels. This information is not intended to replace advice given to you by your health care provider. Make sure you discuss any questions you have with your health care provider. Document Released: 06/11/2004 Document Revised: 10/10/2015 Document Reviewed: 07/30/2014 Elsevier Interactive Patient   Education  2018 [employer] Inc.  

## 2016-11-04 NOTE — Progress Notes (Signed)
Discharge instructions and medications discussed with patient.  Prescriptions given to patient.  All questions answered.  

## 2016-11-04 NOTE — Consult Note (Addendum)
   [facility] Inpatient Consult   11/04/2016  Juan [name] 13-Oct-1940 [id number]   11:12 am . Referral received from inpatient RNCM, [place] regarding to see if patient has had [place] following. Patient is in [place] registry.  Chart review reveals the patient has been seen by HF EMT/Para-Medicine.  Updated [employer] RNCM, on findings.  Chart review reveals that PT and OT are recommending home health care. His primary care provider is listed as [facility] which this provider generally does their transition of care calls.  Will follow up for referral needs.  For questions, please contact:  [name], RN BSN [facility] Liaison  [phone] business mobile phone Toll free office [phone]   Late entry for 11/04/16 [phone]  Spoke with the patient via phone in hospital room, HIPAA verified.  Explained to patient regarding [facility] again.  Patient states, "I got what I need for when I go home, I don't need it, hung up."  Spoke with [facility] with HF clinic and they will follow up with Para Medicine and patient has an appointment for 11/05/16 noted in EPIC.  Patient declined services.  [name], RN BSN [facility] Liaison  [phone] business mobile phone Toll free office [phone]

## 2016-11-04 NOTE — Care Management Note (Addendum)
Case Management Note  Patient Details  Name: Juan [name] MRN: [id number] Date of Birth: 1941-03-26  Subjective/Objective:                 Spoke to patient at the bedside. He would like to use HH through [place]. He states he would like to start Friday at 10:00, and also states that he has a hard time hearing his phone ring at home. Relayed this info to [place] liaison for [facility]. Spoke with [name] [facility], she provided that he patient follows with paramedicine through the HF clinic. Patient requested transport through [facility] to home. Patient states he has a 3/1. Medical necessity form and facesheet with SS# on front of chart. RN notified to call [facility], number provided, when ready to discharge. Patient ordered [facility], however [facility] cannot transport DME.    Action/Plan:   Expected Discharge Date:                  Expected Discharge Plan:  [place]  In-House Referral:     Discharge planning Services  CM Consult  Post Acute Care Choice:  Home Health Choice offered to:  Patient  DME Arranged:    DME Agency:     HH Arranged:  RN, PT, OT, Nurse's Aide [place] Agency:   Nanine Means)  Status of Service:  Completed, signed off  If discussed at [place] of Stay Meetings, dates discussed:    Additional Comments:  Juan Collet, RN 11/04/2016, 11:46 AM

## 2016-11-04 NOTE — Progress Notes (Signed)
SATURATION QUALIFICATIONS: (This note is used to comply with regulatory documentation for home oxygen)  Patient Saturations on Room Air at Rest = 92%  Patient Saturations on Room Air while Ambulating = 84%  Patient Saturations on 2 Liters of oxygen while Ambulating = 94%  Please briefly explain why patient needs home oxygen:

## 2016-11-04 NOTE — Telephone Encounter (Signed)
This was actually an encounter with the social worker on the floor that pt is currently on.  Pt is currently in room [place]. The social worker stated that pt will begin receiving services through [facility] (weekly nurse, home aide and physical therapist).

## 2016-11-05 ENCOUNTER — Other Ambulatory Visit: Payer: Self-pay

## 2016-11-05 ENCOUNTER — Inpatient Hospital Stay (HOSPITAL_COMMUNITY): Admission: RE | Admit: 2016-11-05 | Payer: Self-pay | Source: Ambulatory Visit

## 2016-11-05 ENCOUNTER — Telehealth: Payer: Self-pay

## 2016-11-05 ENCOUNTER — Telehealth (HOSPITAL_COMMUNITY): Payer: Self-pay

## 2016-11-05 LAB — CULTURE, BLOOD (ROUTINE X 2)
CULTURE: NO GROWTH
Culture: NO GROWTH
SPECIAL REQUESTS: ADEQUATE
Special Requests: ADEQUATE

## 2016-11-05 NOTE — Consult Note (Signed)
Patient was contacted and also sent an email to Primary Care Provider contact Durward Fortes regarding patient's discharge and transition of care call and to contact [facility] if patient is agreeable to community care management.  Patient is also active with HF programs.  [name], RN BSN [facility] Liaison  [phone] business mobile phone Toll free office [phone]

## 2016-11-05 NOTE — Telephone Encounter (Signed)
Called patient to complete hospital follow up. Patient states he cannot schedule appointment for Hospital Follow Up with Dr. Larose Kells until after he goes to his follow up scheduled with Cardiology on July the 5th. States he will not have any money until after that time. Patient states he had to go to bathroom and needed to sit down and could not complete the phone interview with me. Asked patient if I could call back later today he stated that would be fine.

## 2016-11-05 NOTE — Telephone Encounter (Signed)
Patient calling CHF clinic to ask for financial assitance to obtain medications prescribed at [place]. States his antibiotic is too expensive. Kristopher Oppenheim on [place] offers an out of pocket program for Mr. Piascik to obtian his Augmentin for $9.  Also stating he does not have money for [employer]  Patient states he does not have any money at all. Patient asking for our office to pay for his medication and send a taxi for them to go get it. These medications were not prescribed by our office and patient obtained these Rx's at discharge. Patient has been talking with CHF SW Raquel Sarna regarding financial assistance for medications as he is eligible for Medicare Part D but refuses to give them info to sign up. Will forward to [place] to make aware that this is an ongoing issue.  Renee Pain, RN

## 2016-11-05 NOTE — Telephone Encounter (Addendum)
Transition Care Management Follow-up Telephone Call  ADMISSION DATE: 10/31/16  DISCHARGE DATE: 11/04/16   How have you been since you were released from the hospital? Patient states he has not been feeling well since hospital release. States his hand is swollen. Has not had (new) medications.  Do you understand why you were in the hospital? Yes per patient.   Do you understand the discharge instrcutions? Yes per patient.    Items Reviewed:  Medications reviewed: Unable to review meds because patient states he did not feel up to it. States he also has not had the money to buy them.   Allergies reviewed: Unable to review with patient   Dietary changes reviewed: Heart Healthy    Referrals reviewed: Yes. Patient states he is not able to come in for Hospital follow up visit. He says he will not be in until after his July appointment because he has no money.    Functional Questionnaire:   Activities of Daily Living (ADLs):   Any patient concerns?  Patient states he feels bad and does not have money to buy his new medications or schedule a hospital follow up visit.   Confirmed importance and date/time of follow-up visits scheduled: Yes advised it would be very important to try to schedule hospital follow up visit. Confirmed with patient if condition begins to worsen call PCP or go to the ER. Yes   Patient was given the office number and encouragred to call back with questions or concerns.

## 2016-11-06 ENCOUNTER — Telehealth: Payer: Self-pay | Admitting: Licensed Clinical Social Worker

## 2016-11-06 LAB — BODY FLUID CULTURE: CULTURE: NO GROWTH

## 2016-11-06 NOTE — Telephone Encounter (Signed)
CSW received referral to assist patient with prescription assistance as he received prescriptions and states he has no money to get obtain them. CSW attempted to provide patient with some community resources to assist with obtaining although patient states " I am not going to call anyone because I don't want to go through any red tape and give my information". Patient states "I will just suffer until I get my money because I don't want to give out my personal information". Patient then stated issues with his oxygen at home. CSW contacted [facility] for further assistance per patient request. CSW offered assistance but explained limited without patient willing to provide needed information to community resources for prescription assistance. Patient verbalizes understanding and denies assistance. CSW available should patient change his mind. Raquel Sarna, [place], [place]

## 2016-11-07 DIAGNOSIS — I130 Hypertensive heart and chronic kidney disease with heart failure and stage 1 through stage 4 chronic kidney disease, or unspecified chronic kidney disease: Secondary | ICD-10-CM | POA: Diagnosis not present

## 2016-11-07 DIAGNOSIS — J189 Pneumonia, unspecified organism: Secondary | ICD-10-CM | POA: Diagnosis not present

## 2016-11-07 DIAGNOSIS — E1122 Type 2 diabetes mellitus with diabetic chronic kidney disease: Secondary | ICD-10-CM | POA: Diagnosis not present

## 2016-11-07 DIAGNOSIS — Z9981 Dependence on supplemental oxygen: Secondary | ICD-10-CM | POA: Diagnosis not present

## 2016-11-07 DIAGNOSIS — Z8701 Personal history of pneumonia (recurrent): Secondary | ICD-10-CM | POA: Diagnosis not present

## 2016-11-07 DIAGNOSIS — Z87891 Personal history of nicotine dependence: Secondary | ICD-10-CM | POA: Diagnosis not present

## 2016-11-07 DIAGNOSIS — N183 Chronic kidney disease, stage 3 (moderate): Secondary | ICD-10-CM | POA: Diagnosis not present

## 2016-11-07 DIAGNOSIS — I5032 Chronic diastolic (congestive) heart failure: Secondary | ICD-10-CM | POA: Diagnosis not present

## 2016-11-07 DIAGNOSIS — Z7982 Long term (current) use of aspirin: Secondary | ICD-10-CM | POA: Diagnosis not present

## 2016-11-07 DIAGNOSIS — M10032 Idiopathic gout, left wrist: Secondary | ICD-10-CM | POA: Diagnosis not present

## 2016-11-07 DIAGNOSIS — D731 Hypersplenism: Secondary | ICD-10-CM | POA: Diagnosis not present

## 2016-11-09 ENCOUNTER — Telehealth: Payer: Self-pay | Admitting: Internal Medicine

## 2016-11-09 NOTE — Telephone Encounter (Signed)
Corrections made on forms, forms signed by PCP and faxed back to [facility] at [phone].

## 2016-11-09 NOTE — Telephone Encounter (Signed)
Noted  

## 2016-11-09 NOTE — Telephone Encounter (Signed)
Caller name:Tammy w/ [place] Relationship to patient: Can be [place]:  Reason for call:Needs verbal orders for PT to eval and treat, Medical & Disease Management and MSW

## 2016-11-09 NOTE — Telephone Encounter (Signed)
Spoke w/ Tammy, informed that Pt has declined PT eval and treat, but would give orders to try to discuss with Pt if willing. Given orders for RN team to go out and help with medical, disease management, and meds. Tammy wanted to inform PCP that Pt has not been taking any DM meds and Pt was unable to pick up Augmentin 500-125mg  d/t cost and money situation. Discussed social worker going out to home to discuss options w/ Pt such as Medicaid- verbal order given, but Medicaid can take 30-45 days to hear back regarding determination. Pt knows he needs hospital follow-up w/ PCP- will call after 11/19/2016 to schedule again d/t money issue.

## 2016-11-09 NOTE — Telephone Encounter (Signed)
If agreeable, would be great if he has PT. Agree we  orders for RN team to go out and helped the patient. Social worker may be of great help.

## 2016-11-09 NOTE — Telephone Encounter (Signed)
Caller name: [name] Relation to pt: PT from [place] Call back number: [phone]  Pharmacy:  Reason for call:  Patient declined PT and made aware he doesn't need PT

## 2016-11-09 NOTE — Telephone Encounter (Signed)
FYI

## 2016-11-10 DIAGNOSIS — I5032 Chronic diastolic (congestive) heart failure: Secondary | ICD-10-CM | POA: Diagnosis not present

## 2016-11-10 DIAGNOSIS — M10032 Idiopathic gout, left wrist: Secondary | ICD-10-CM | POA: Diagnosis not present

## 2016-11-10 DIAGNOSIS — J189 Pneumonia, unspecified organism: Secondary | ICD-10-CM | POA: Diagnosis not present

## 2016-11-10 DIAGNOSIS — E1122 Type 2 diabetes mellitus with diabetic chronic kidney disease: Secondary | ICD-10-CM | POA: Diagnosis not present

## 2016-11-10 DIAGNOSIS — I130 Hypertensive heart and chronic kidney disease with heart failure and stage 1 through stage 4 chronic kidney disease, or unspecified chronic kidney disease: Secondary | ICD-10-CM | POA: Diagnosis not present

## 2016-11-10 DIAGNOSIS — N183 Chronic kidney disease, stage 3 (moderate): Secondary | ICD-10-CM | POA: Diagnosis not present

## 2016-11-12 DIAGNOSIS — M10032 Idiopathic gout, left wrist: Secondary | ICD-10-CM | POA: Diagnosis not present

## 2016-11-12 DIAGNOSIS — E1122 Type 2 diabetes mellitus with diabetic chronic kidney disease: Secondary | ICD-10-CM | POA: Diagnosis not present

## 2016-11-12 DIAGNOSIS — I130 Hypertensive heart and chronic kidney disease with heart failure and stage 1 through stage 4 chronic kidney disease, or unspecified chronic kidney disease: Secondary | ICD-10-CM | POA: Diagnosis not present

## 2016-11-12 DIAGNOSIS — J189 Pneumonia, unspecified organism: Secondary | ICD-10-CM | POA: Diagnosis not present

## 2016-11-12 DIAGNOSIS — I5032 Chronic diastolic (congestive) heart failure: Secondary | ICD-10-CM | POA: Diagnosis not present

## 2016-11-12 DIAGNOSIS — N183 Chronic kidney disease, stage 3 (moderate): Secondary | ICD-10-CM | POA: Diagnosis not present

## 2016-11-13 DIAGNOSIS — I5032 Chronic diastolic (congestive) heart failure: Secondary | ICD-10-CM | POA: Diagnosis not present

## 2016-11-13 DIAGNOSIS — I130 Hypertensive heart and chronic kidney disease with heart failure and stage 1 through stage 4 chronic kidney disease, or unspecified chronic kidney disease: Secondary | ICD-10-CM | POA: Diagnosis not present

## 2016-11-13 DIAGNOSIS — E1122 Type 2 diabetes mellitus with diabetic chronic kidney disease: Secondary | ICD-10-CM | POA: Diagnosis not present

## 2016-11-13 DIAGNOSIS — M10032 Idiopathic gout, left wrist: Secondary | ICD-10-CM | POA: Diagnosis not present

## 2016-11-13 DIAGNOSIS — J189 Pneumonia, unspecified organism: Secondary | ICD-10-CM | POA: Diagnosis not present

## 2016-11-13 DIAGNOSIS — N183 Chronic kidney disease, stage 3 (moderate): Secondary | ICD-10-CM | POA: Diagnosis not present

## 2016-11-14 ENCOUNTER — Encounter (HOSPITAL_COMMUNITY): Payer: Self-pay | Admitting: Radiology

## 2016-11-14 ENCOUNTER — Inpatient Hospital Stay (HOSPITAL_COMMUNITY)
Admission: EM | Admit: 2016-11-14 | Discharge: 2016-11-16 | DRG: 555 | Disposition: A | Discharge status: Skilled Nursing Facility | Payer: Medicare Other | Attending: Internal Medicine | Admitting: Internal Medicine

## 2016-11-14 ENCOUNTER — Emergency Department (HOSPITAL_COMMUNITY): Payer: Medicare Other

## 2016-11-14 DIAGNOSIS — D7581 Myelofibrosis: Secondary | ICD-10-CM | POA: Diagnosis not present

## 2016-11-14 DIAGNOSIS — J020 Streptococcal pharyngitis: Secondary | ICD-10-CM | POA: Diagnosis present

## 2016-11-14 DIAGNOSIS — R7989 Other specified abnormal findings of blood chemistry: Secondary | ICD-10-CM | POA: Diagnosis not present

## 2016-11-14 DIAGNOSIS — D731 Hypersplenism: Secondary | ICD-10-CM | POA: Diagnosis present

## 2016-11-14 DIAGNOSIS — J189 Pneumonia, unspecified organism: Secondary | ICD-10-CM | POA: Diagnosis present

## 2016-11-14 DIAGNOSIS — Z8701 Personal history of pneumonia (recurrent): Secondary | ICD-10-CM

## 2016-11-14 DIAGNOSIS — R2681 Unsteadiness on feet: Secondary | ICD-10-CM | POA: Diagnosis not present

## 2016-11-14 DIAGNOSIS — N183 Chronic kidney disease, stage 3 unspecified: Secondary | ICD-10-CM | POA: Diagnosis present

## 2016-11-14 DIAGNOSIS — D508 Other iron deficiency anemias: Secondary | ICD-10-CM | POA: Diagnosis not present

## 2016-11-14 DIAGNOSIS — J9610 Chronic respiratory failure, unspecified whether with hypoxia or hypercapnia: Secondary | ICD-10-CM | POA: Diagnosis present

## 2016-11-14 DIAGNOSIS — Z9981 Dependence on supplemental oxygen: Secondary | ICD-10-CM | POA: Diagnosis not present

## 2016-11-14 DIAGNOSIS — G4733 Obstructive sleep apnea (adult) (pediatric): Secondary | ICD-10-CM | POA: Diagnosis present

## 2016-11-14 DIAGNOSIS — X58XXXA Exposure to other specified factors, initial encounter: Secondary | ICD-10-CM | POA: Diagnosis present

## 2016-11-14 DIAGNOSIS — R262 Difficulty in walking, not elsewhere classified: Secondary | ICD-10-CM | POA: Diagnosis not present

## 2016-11-14 DIAGNOSIS — I2721 Secondary pulmonary arterial hypertension: Secondary | ICD-10-CM | POA: Diagnosis present

## 2016-11-14 DIAGNOSIS — M62838 Other muscle spasm: Secondary | ICD-10-CM | POA: Diagnosis not present

## 2016-11-14 DIAGNOSIS — Z7951 Long term (current) use of inhaled steroids: Secondary | ICD-10-CM

## 2016-11-14 DIAGNOSIS — I5032 Chronic diastolic (congestive) heart failure: Secondary | ICD-10-CM

## 2016-11-14 DIAGNOSIS — D509 Iron deficiency anemia, unspecified: Secondary | ICD-10-CM | POA: Diagnosis present

## 2016-11-14 DIAGNOSIS — E1140 Type 2 diabetes mellitus with diabetic neuropathy, unspecified: Secondary | ICD-10-CM | POA: Diagnosis present

## 2016-11-14 DIAGNOSIS — E869 Volume depletion, unspecified: Secondary | ICD-10-CM | POA: Diagnosis present

## 2016-11-14 DIAGNOSIS — I248 Other forms of acute ischemic heart disease: Secondary | ICD-10-CM | POA: Diagnosis present

## 2016-11-14 DIAGNOSIS — E860 Dehydration: Secondary | ICD-10-CM | POA: Diagnosis present

## 2016-11-14 DIAGNOSIS — C946 Myelodysplastic disease, not classified: Secondary | ICD-10-CM | POA: Diagnosis present

## 2016-11-14 DIAGNOSIS — M109 Gout, unspecified: Secondary | ICD-10-CM | POA: Diagnosis present

## 2016-11-14 DIAGNOSIS — R509 Fever, unspecified: Secondary | ICD-10-CM

## 2016-11-14 DIAGNOSIS — D649 Anemia, unspecified: Secondary | ICD-10-CM | POA: Diagnosis not present

## 2016-11-14 DIAGNOSIS — R300 Dysuria: Secondary | ICD-10-CM | POA: Diagnosis present

## 2016-11-14 DIAGNOSIS — R748 Abnormal levels of other serum enzymes: Secondary | ICD-10-CM | POA: Diagnosis not present

## 2016-11-14 DIAGNOSIS — I25119 Atherosclerotic heart disease of native coronary artery with unspecified angina pectoris: Secondary | ICD-10-CM | POA: Diagnosis not present

## 2016-11-14 DIAGNOSIS — S161XXA Strain of muscle, fascia and tendon at neck level, initial encounter: Secondary | ICD-10-CM | POA: Diagnosis present

## 2016-11-14 DIAGNOSIS — S199XXA Unspecified injury of neck, initial encounter: Secondary | ICD-10-CM | POA: Diagnosis not present

## 2016-11-14 DIAGNOSIS — I712 Thoracic aortic aneurysm, without rupture: Secondary | ICD-10-CM | POA: Diagnosis present

## 2016-11-14 DIAGNOSIS — I2510 Atherosclerotic heart disease of native coronary artery without angina pectoris: Secondary | ICD-10-CM | POA: Diagnosis present

## 2016-11-14 DIAGNOSIS — I10 Essential (primary) hypertension: Secondary | ICD-10-CM

## 2016-11-14 DIAGNOSIS — R040 Epistaxis: Secondary | ICD-10-CM | POA: Diagnosis not present

## 2016-11-14 DIAGNOSIS — R799 Abnormal finding of blood chemistry, unspecified: Secondary | ICD-10-CM | POA: Diagnosis not present

## 2016-11-14 DIAGNOSIS — D62 Acute posthemorrhagic anemia: Secondary | ICD-10-CM | POA: Diagnosis present

## 2016-11-14 DIAGNOSIS — R778 Other specified abnormalities of plasma proteins: Secondary | ICD-10-CM | POA: Diagnosis present

## 2016-11-14 DIAGNOSIS — R41841 Cognitive communication deficit: Secondary | ICD-10-CM | POA: Diagnosis not present

## 2016-11-14 DIAGNOSIS — E119 Type 2 diabetes mellitus without complications: Secondary | ICD-10-CM | POA: Diagnosis not present

## 2016-11-14 DIAGNOSIS — I130 Hypertensive heart and chronic kidney disease with heart failure and stage 1 through stage 4 chronic kidney disease, or unspecified chronic kidney disease: Secondary | ICD-10-CM | POA: Diagnosis present

## 2016-11-14 DIAGNOSIS — Z79899 Other long term (current) drug therapy: Secondary | ICD-10-CM

## 2016-11-14 DIAGNOSIS — M6281 Muscle weakness (generalized): Secondary | ICD-10-CM | POA: Diagnosis not present

## 2016-11-14 DIAGNOSIS — E1122 Type 2 diabetes mellitus with diabetic chronic kidney disease: Secondary | ICD-10-CM | POA: Diagnosis present

## 2016-11-14 DIAGNOSIS — Z87891 Personal history of nicotine dependence: Secondary | ICD-10-CM

## 2016-11-14 DIAGNOSIS — E785 Hyperlipidemia, unspecified: Secondary | ICD-10-CM | POA: Diagnosis not present

## 2016-11-14 DIAGNOSIS — I509 Heart failure, unspecified: Secondary | ICD-10-CM | POA: Diagnosis not present

## 2016-11-14 DIAGNOSIS — M542 Cervicalgia: Secondary | ICD-10-CM

## 2016-11-14 DIAGNOSIS — Z7982 Long term (current) use of aspirin: Secondary | ICD-10-CM

## 2016-11-14 LAB — BASIC METABOLIC PANEL
Anion gap: 12 (ref 5–15)
BUN: 64 mg/dL — AB (ref 6–20)
CALCIUM: 9.3 mg/dL (ref 8.9–10.3)
CO2: 24 mmol/L (ref 22–32)
CREATININE: 1.14 mg/dL (ref 0.61–1.24)
Chloride: 103 mmol/L (ref 101–111)
GFR calc non Af Amer: >60 mL/min (ref >60)
Glucose, Bld: 121 mg/dL — ABNORMAL HIGH (ref 65–99)
Potassium: 3.9 mmol/L (ref 3.5–5.1)
SODIUM: 139 mmol/L (ref 135–145)

## 2016-11-14 LAB — PROTIME-INR
INR: 1.27
Prothrombin Time: 16 seconds — ABNORMAL HIGH (ref 11.4–15.2)

## 2016-11-14 LAB — TROPONIN I
TROPONIN I: 0.03 ng/mL — AB (ref <0.03)
TROPONIN I: 0.03 ng/mL — AB (ref <0.03)
TROPONIN I: 0.03 ng/mL — AB (ref <0.03)
Troponin I: 0.03 ng/mL (ref <0.03)

## 2016-11-14 LAB — CBC WITH DIFFERENTIAL/PLATELET
BASOS ABS: 0.4 10*3/uL — AB (ref 0.0–0.1)
BASOS PCT: 3 %
EOS ABS: 0.3 10*3/uL (ref 0.0–0.7)
Eosinophils Relative: 2 %
HCT: 30.5 % — ABNORMAL LOW (ref 39.0–52.0)
Hemoglobin: 9.4 g/dL — ABNORMAL LOW (ref 13.0–17.0)
Lymphocytes Relative: 16 %
Lymphs Abs: 2 10*3/uL (ref 0.7–4.0)
MCH: 25.3 pg — AB (ref 26.0–34.0)
MCHC: 30.8 g/dL (ref 30.0–36.0)
MCV: 82 fL (ref 78.0–100.0)
Monocytes Absolute: 2.8 10*3/uL — ABNORMAL HIGH (ref 0.1–1.0)
Monocytes Relative: 22 %
NEUTROS ABS: 7.3 10*3/uL (ref 1.7–7.7)
Neutrophils Relative %: 57 %
PLATELETS: 432 10*3/uL — AB (ref 150–400)
RBC: 3.72 MIL/uL — ABNORMAL LOW (ref 4.22–5.81)
RDW: 18.8 % — AB (ref 11.5–15.5)
WBC: 12.8 10*3/uL — ABNORMAL HIGH (ref 4.0–10.5)

## 2016-11-14 LAB — COMPREHENSIVE METABOLIC PANEL
ALT: 13 U/L — ABNORMAL LOW (ref 17–63)
ANION GAP: 9 (ref 5–15)
AST: 25 U/L (ref 15–41)
Albumin: 3 g/dL — ABNORMAL LOW (ref 3.5–5.0)
Alkaline Phosphatase: 105 U/L (ref 38–126)
BILIRUBIN TOTAL: 0.5 mg/dL (ref 0.3–1.2)
BUN: 65 mg/dL — AB (ref 6–20)
CO2: 27 mmol/L (ref 22–32)
Calcium: 9.4 mg/dL (ref 8.9–10.3)
Chloride: 103 mmol/L (ref 101–111)
Creatinine, Ser: 1.13 mg/dL (ref 0.61–1.24)
Glucose, Bld: 122 mg/dL — ABNORMAL HIGH (ref 65–99)
POTASSIUM: 3.6 mmol/L (ref 3.5–5.1)
Sodium: 139 mmol/L (ref 135–145)
TOTAL PROTEIN: 5.9 g/dL — AB (ref 6.5–8.1)

## 2016-11-14 LAB — URINALYSIS, ROUTINE W REFLEX MICROSCOPIC
BILIRUBIN URINE: NEGATIVE
Bacteria, UA: NONE SEEN
GLUCOSE, UA: NEGATIVE mg/dL
HGB URINE DIPSTICK: NEGATIVE
Ketones, ur: NEGATIVE mg/dL
Leukocytes, UA: NEGATIVE
NITRITE: NEGATIVE
PROTEIN: 100 mg/dL — AB
Specific Gravity, Urine: 1.01 (ref 1.005–1.030)
Squamous Epithelial / LPF: NONE SEEN
pH: 5 (ref 5.0–8.0)

## 2016-11-14 LAB — I-STAT CG4 LACTIC ACID, ED
LACTIC ACID, VENOUS: 2.1 mmol/L — AB (ref 0.5–1.9)
Lactic Acid, Venous: 0.72 mmol/L (ref 0.5–1.9)

## 2016-11-14 LAB — CBC
HCT: 35.5 % — ABNORMAL LOW (ref 39.0–52.0)
Hemoglobin: 10.7 g/dL — ABNORMAL LOW (ref 13.0–17.0)
MCH: 25.1 pg — AB (ref 26.0–34.0)
MCHC: 30.1 g/dL (ref 30.0–36.0)
MCV: 83.1 fL (ref 78.0–100.0)
PLATELETS: 401 10*3/uL — AB (ref 150–400)
RBC: 4.27 MIL/uL (ref 4.22–5.81)
RDW: 18.8 % — AB (ref 11.5–15.5)
WBC: 12.7 10*3/uL — AB (ref 4.0–10.5)

## 2016-11-14 LAB — LIPID PANEL
CHOLESTEROL: 176 mg/dL (ref 0–200)
HDL: 59 mg/dL (ref >40)
LDL Cholesterol: 106 mg/dL — ABNORMAL HIGH (ref 0–99)
Total CHOL/HDL Ratio: 3 RATIO
Triglycerides: 56 mg/dL (ref <150)
VLDL: 11 mg/dL (ref 0–40)

## 2016-11-14 LAB — SEDIMENTATION RATE: Sed Rate: 55 mm/hr — ABNORMAL HIGH (ref 0–16)

## 2016-11-14 LAB — MRSA PCR SCREENING: MRSA BY PCR: NEGATIVE

## 2016-11-14 LAB — RAPID STREP SCREEN (MED CTR MEBANE ONLY): Streptococcus, Group A Screen (Direct): POSITIVE — AB

## 2016-11-14 LAB — GLUCOSE, CAPILLARY: GLUCOSE-CAPILLARY: 108 mg/dL — AB (ref 65–99)

## 2016-11-14 LAB — BRAIN NATRIURETIC PEPTIDE: B NATRIURETIC PEPTIDE 5: 186.6 pg/mL — AB (ref 0.0–100.0)

## 2016-11-14 MED ORDER — METHOCARBAMOL 750 MG PO TABS
750.0000 mg | ORAL_TABLET | Freq: Three times a day (TID) | ORAL | Status: DC
  Administered 2016-11-14 – 2016-11-16 (×8): 750 mg via ORAL
  Filled 2016-11-14 (×8): qty 1

## 2016-11-14 MED ORDER — ENOXAPARIN SODIUM 40 MG/0.4ML ~~LOC~~ SOLN
40.0000 mg | SUBCUTANEOUS | Status: DC
  Administered 2016-11-14 – 2016-11-16 (×3): 40 mg via SUBCUTANEOUS
  Filled 2016-11-14 (×3): qty 0.4

## 2016-11-14 MED ORDER — SODIUM CHLORIDE 0.9% FLUSH
3.0000 mL | Freq: Two times a day (BID) | INTRAVENOUS | Status: DC
  Administered 2016-11-14 – 2016-11-16 (×5): 3 mL via INTRAVENOUS

## 2016-11-14 MED ORDER — DEXTROSE 5 % IV SOLN
2.0000 g | Freq: Once | INTRAVENOUS | Status: AC
  Administered 2016-11-14: 2 g via INTRAVENOUS
  Filled 2016-11-14: qty 2

## 2016-11-14 MED ORDER — FERROUS GLUCONATE 324 (38 FE) MG PO TABS
324.0000 mg | ORAL_TABLET | Freq: Every day | ORAL | Status: DC
  Administered 2016-11-14 – 2016-11-15 (×2): 324 mg via ORAL
  Filled 2016-11-14 (×2): qty 1

## 2016-11-14 MED ORDER — CARVEDILOL 3.125 MG PO TABS
3.1250 mg | ORAL_TABLET | Freq: Two times a day (BID) | ORAL | Status: DC
  Administered 2016-11-14 – 2016-11-16 (×6): 3.125 mg via ORAL
  Filled 2016-11-14 (×6): qty 1

## 2016-11-14 MED ORDER — BISACODYL 10 MG RE SUPP: 10.0000 mg | Freq: Every day | RECTAL | Status: DC | PRN

## 2016-11-14 MED ORDER — ONDANSETRON HCL 4 MG/2ML IJ SOLN: 4.0000 mg | Freq: Four times a day (QID) | INTRAMUSCULAR | Status: DC | PRN

## 2016-11-14 MED ORDER — IOPAMIDOL (ISOVUE-300) INJECTION 61%
INTRAVENOUS | Status: AC
  Administered 2016-11-14: 75 mL
  Filled 2016-11-14: qty 75

## 2016-11-14 MED ORDER — VITAMIN B COMPLEX PO TABS: 1.0000 | ORAL_TABLET | Freq: Every day | ORAL | Status: DC

## 2016-11-14 MED ORDER — SODIUM CHLORIDE 0.9 % IV BOLUS (SEPSIS)
500.0000 mL | Freq: Once | INTRAVENOUS | Status: AC
  Administered 2016-11-14: 500 mL via INTRAVENOUS

## 2016-11-14 MED ORDER — ACETAMINOPHEN 325 MG PO TABS: 650.0000 mg | ORAL_TABLET | Freq: Four times a day (QID) | ORAL | Status: DC | PRN

## 2016-11-14 MED ORDER — DEXTROSE 5 % IV SOLN
2.0000 g | INTRAVENOUS | Status: DC
  Administered 2016-11-15 – 2016-11-16 (×2): 2 g via INTRAVENOUS
  Filled 2016-11-14 (×4): qty 2

## 2016-11-14 MED ORDER — TORSEMIDE 20 MG PO TABS
80.0000 mg | ORAL_TABLET | Freq: Every day | ORAL | Status: DC
  Administered 2016-11-14 – 2016-11-16 (×3): 80 mg via ORAL
  Filled 2016-11-14 (×3): qty 4

## 2016-11-14 MED ORDER — ONDANSETRON HCL 4 MG PO TABS: 4.0000 mg | ORAL_TABLET | Freq: Four times a day (QID) | ORAL | Status: DC | PRN

## 2016-11-14 MED ORDER — ISOSORBIDE MONONITRATE ER 30 MG PO TB24
30.0000 mg | ORAL_TABLET | Freq: Two times a day (BID) | ORAL | Status: DC
  Administered 2016-11-14 – 2016-11-16 (×5): 30 mg via ORAL
  Filled 2016-11-14 (×5): qty 1

## 2016-11-14 MED ORDER — HYDROCODONE-ACETAMINOPHEN 5-325 MG PO TABS
1.0000 | ORAL_TABLET | Freq: Four times a day (QID) | ORAL | Status: DC | PRN
  Administered 2016-11-14 – 2016-11-15 (×2): 1 via ORAL
  Filled 2016-11-14 (×2): qty 1

## 2016-11-14 MED ORDER — SIMETHICONE 80 MG PO CHEW: 80.0000 mg | CHEWABLE_TABLET | Freq: Four times a day (QID) | ORAL | Status: DC | PRN

## 2016-11-14 MED ORDER — GABAPENTIN 100 MG PO CAPS: 200.0000 mg | ORAL_CAPSULE | Freq: Three times a day (TID) | ORAL | Status: DC | PRN

## 2016-11-14 MED ORDER — B COMPLEX-C PO TABS
1.0000 | ORAL_TABLET | Freq: Every day | ORAL | Status: DC
  Administered 2016-11-14 – 2016-11-16 (×3): 1 via ORAL
  Filled 2016-11-14 (×3): qty 1

## 2016-11-14 MED ORDER — DICLOFENAC SODIUM 1 % TD GEL
4.0000 g | Freq: Two times a day (BID) | TRANSDERMAL | Status: DC | PRN
  Administered 2016-11-15: 10:00:00 4 g via TOPICAL
  Filled 2016-11-14: qty 100

## 2016-11-14 MED ORDER — HYDROCERIN EX CREA: 1.0000 "application " | TOPICAL_CREAM | Freq: Every day | CUTANEOUS | Status: DC | PRN

## 2016-11-14 MED ORDER — FLUTICASONE PROPIONATE 50 MCG/ACT NA SUSP: 2.0000 | Freq: Every day | NASAL | Status: DC

## 2016-11-14 MED ORDER — MUSCLE RUB 10-15 % EX CREA
TOPICAL_CREAM | CUTANEOUS | Status: DC | PRN
  Administered 2016-11-14 – 2016-11-15 (×3): via TOPICAL
  Filled 2016-11-14: qty 85

## 2016-11-14 MED ORDER — GLUCERNA SHAKE PO LIQD
118.5000 mL | Freq: Every day | ORAL | Status: DC
  Administered 2016-11-14 – 2016-11-16 (×3): 118.5 mL via ORAL

## 2016-11-14 MED ORDER — DM-GUAIFENESIN ER 30-600 MG PO TB12: 1.0000 | ORAL_TABLET | Freq: Two times a day (BID) | ORAL | Status: DC | PRN

## 2016-11-14 MED ORDER — METOLAZONE 2.5 MG PO TABS
2.5000 mg | ORAL_TABLET | Freq: Every day | ORAL | Status: DC
  Filled 2016-11-14: qty 1

## 2016-11-14 MED ORDER — ASPIRIN 325 MG PO TABS
325.0000 mg | ORAL_TABLET | Freq: Every day | ORAL | Status: DC
  Administered 2016-11-14 – 2016-11-16 (×3): 325 mg via ORAL
  Filled 2016-11-14 (×3): qty 1

## 2016-11-14 MED ORDER — ALBUTEROL SULFATE (2.5 MG/3ML) 0.083% IN NEBU
2.5000 mg | INHALATION_SOLUTION | RESPIRATORY_TRACT | Status: DC | PRN
Start: 2016-11-14 — End: 2016-11-17

## 2016-11-14 MED ORDER — COLCHICINE 0.6 MG PO TABS: 0.6000 mg | ORAL_TABLET | Freq: Every day | ORAL | Status: DC | PRN

## 2016-11-14 MED ORDER — HYDRALAZINE HCL 25 MG PO TABS
25.0000 mg | ORAL_TABLET | Freq: Three times a day (TID) | ORAL | Status: DC
Start: 2016-11-14 — End: 2016-11-17
  Administered 2016-11-14 – 2016-11-16 (×8): 25 mg via ORAL
  Filled 2016-11-14 (×9): qty 1

## 2016-11-14 MED ORDER — HYDRALAZINE HCL 20 MG/ML IJ SOLN: 5.0000 mg | INTRAMUSCULAR | Status: DC | PRN

## 2016-11-14 NOTE — Progress Notes (Signed)
PROGRESS NOTE  Juan [name]  WLS:[id number] DOB: 01-22-1941 DOA: 11/14/2016 PCP: Colon Branch, MD  Brief Narrative:   The patient is a 76 year old male with history of hypertension, diet-controlled diabetes, chronic respiratory failure on 2 L oxygen, OSA not on CPAP, gout, myeloproliferative disease, diastolic heart failure, CAD, ascending aortic aneurysm, anemia, CKD-3 who presented with neck pain and fever. The patient was hospitalized from 6/16-6/20 with acute gout and pneumonia. He was discharged in stable condition. He was unable to afford the $9 prescription for Augmentin and therefore did not continue his antibiotics at home.  He was doing well into until 2 days prior to admission when he developed fever and neck pain. He had a temperature of 100 Fahrenheit at home. His pain was located in the left side of his neck, 8 out of 10 in severity. In the emergency department, he had a positive rapid strep test. His white blood cell count was 12.8, and he had a temperature of 100.5.  CT of the neck demonstrated no evidence of abscess or inflammatory process.     Assessment & Plan:   Principal Problem:   Neck pain Active Problems:   Essential hypertension   Coronary atherosclerosis   Chronic diastolic (congestive) heart failure (HCC)   CKD (chronic kidney disease) stage 3, GFR 30-59 ml/min   Agnogenic myeloid metaplasia (HCC)   Anemia   Gout   Fever   Elevated troponin  Neck pain: Etiology is not clear. CT of neck soft tissue showed no abscess or inflammatory process of the neck, but showed diffuse bony stigmata of myeloproliferative disease and/or metabolic bone disease. Pt may also have neck muscle straining. Another potential differential diagnosis is Lemirerre's syndrom (Jugular vein septic thrombophlebitis) given positive strep test, CT findings are not suggestive for this diagnosis. - OT evaluation -  Menthol rub -continue home Norco prn and tylenol -continue Robaxin  Fever,  leukocytosis with WBC 12.8. Urinalysis is negative. Chest x-rays negative for infiltration.  Recent pneumonia that was not fully treated and postiive rapid strep test -IV rocephin -f/u blood cultures -prn tylenol for fever  HTN:  -continue Coreg, hydralazine -prn IV hydralazine -Hold torsemide and metolazone  Coronary atherosclerosis and positive Trop: Patient troponin is 0.03, but denies any chest pain. Likely due to demand ischemia secondary to fever. - cycle CE q6 x3 and repeat EKG in the am  -continue aspirin and imdur - Risk factor stratification: will check FLP (A1C was 5.8 on 07/28/16)  CKD-III: Stable. Baseline creatinine 1.2-1.5. His creatinine is 1.13, but BUN 68, indicating volume depletion. -received 500 cc NS in ED -f/u renal function by BMP  Agnogenic myeloid metaplasia: per previous discharge summary. "Has seen Dr. Marin Olp in the past (consult note 05/07/16 reviewed). Apparently he has refused a bone marrow biopsy and does not want any treatment for this".  Anemia: Hemoglobin stable. 9.2 on 11/05/14--> 9.4 today. -Continue iron supplement -Follow-up CBC  Gout: -continue prn colchicine  Chronic diastolic congestive heart failure: 2-D echo on 10/14/16 showed EF 50-60 percent with grade 1 diastolic dysfunction. Patient does not have leg edema JVD. CHF is compensated. Holding home metolazone and torsemide for today -Continue aspirin, Coreg - BNP 186.6  DVT prophylaxis:  lovenox Code Status:  Full code Family Communication:  Patient alone Disposition Plan:  To home in 1-2 days. Awaiting blood cultures. OT consult.  Will need case management consult prior to discharge to make sure patient can afford would have her antibiotics are prescribed.   Consultants:  None  Procedures:   CT of neck soft tissue showed 1. No abscess or inflammatory process of the neck identified. 2. Diffuse bony stigmata of myeloproliferative disease and/or metabolic bone disease. 3.  Mosaic attenuation of lung parenchyma may represent small airways or small vessel disease. 4. 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  Antimicrobials:  Anti-infectives    Start     Dose/Rate Route Frequency Ordered Stop   11/15/16 0200  cefTRIAXone (ROCEPHIN) 2 g in dextrose 5 % 50 mL IVPB     2 g 100 mL/hr over 30 Minutes Intravenous Every 24 hours 11/14/16 0449     11/14/16 0345  cefTRIAXone (ROCEPHIN) 2 g in dextrose 5 % 50 mL IVPB     2 g 100 mL/hr over 30 Minutes Intravenous  Once 11/14/16 0332 11/14/16 0411       Subjective:  Still having severe left-sided neck pain and now having some severe pain in the right posterior shoulder rhomboid area. States he has difficulty turning his head side to side. Feels very fatigued, no energy. Denies shortness of breath, chest pains.  Objective: Vitals:   11/14/16 0640 11/14/16 0846 11/14/16 1129 11/14/16 1349  BP: (!) 160/86 127/61 122/64 (!) 112/59  Pulse: 75 90 91 86  Resp: _0 Temp: (!) 100.5 F (38.1 C)  98.5 F (36.9 C)   TempSrc: Oral  Oral   SpO2: 97% 97% 96% 96%  Weight:      Height:        Intake/Output Summary (Last 24 hours) at 11/14/16 1408 Last data filed at 11/14/16 1131  Gross per 24 hour  Intake              950 ml  Output              750 ml  Net              200 ml   Filed Weights   11/14/16 0640  Weight: 85.7 kg (189 lb)    Examination:  General exam:  Adult Male, lying in bed with his head turned to the right side, difficulty turning his head straight forward.  No acute distress.  HEENT:  NCAT, MMM Respiratory system: Clear to auscultation bilaterally. Cardiovascular system: Regular rate and rhythm, normal S1/S2. No murmurs, rubs, gallops or clicks.  Warm extremities Gastrointestinal system: Normal active bowel sounds, soft, nondistended, nontender. MSK:  Normal tone and bulk, trace bilateral lower extremity edema.  TTP over left trapezius muscle.  No TTP over  the carotid/jugular area of the left neck.  TTP over the right rhomboid area.  No overlying rash, no fluctuance, induration or masses Neuro:  Strength 5-/5 all extremities.      Data Reviewed: I have personally reviewed following labs and imaging studies  CBC:  Recent Labs Lab 11/14/16 0154 11/14/16 0504  WBC 12.8* 12.7*  NEUTROABS 7.3  --   HGB 9.4* 10.7*  HCT 30.5* 35.5*  MCV 82.0 83.1  PLT 432* 128*   Basic Metabolic Panel:  Recent Labs Lab 11/14/16 0154 11/14/16 0504  NA 139 139  K 3.6 3.9  CL 103 103  CO2 27 24  GLUCOSE 122* 121*  BUN 65* 64*  CREATININE 1.13 1.14  CALCIUM 9.4 9.3   GFR: Estimated Creatinine Clearance: 67.9 mL/min (by C-G formula based on SCr of 1.14 mg/dL). Liver Function Tests:  Recent Labs Lab 11/14/16 0154  AST 25  ALT 13*  ALKPHOS 105  BILITOT  0.5  PROT 5.9*  ALBUMIN 3.0*   No results for input(s): LIPASE, AMYLASE in the last 168 hours. No results for input(s): AMMONIA in the last 168 hours. Coagulation Profile:  Recent Labs Lab 11/14/16 0158  INR 1.27   Cardiac Enzymes:  Recent Labs Lab 11/14/16 0158 11/14/16 0504 11/14/16 1005  TROPONINI 0.03* 0.03* 0.03*   BNP (last 3 results) No results for input(s): PROBNP in the last 8760 hours. HbA1C: No results for input(s): HGBA1C in the last 72 hours. CBG:  Recent Labs Lab 11/14/16 0817  GLUCAP 108*   Lipid Profile:  Recent Labs  11/14/16 0504  CHOL 176  HDL 59  LDLCALC 106*  TRIG 56  CHOLHDL 3.0   Thyroid Function Tests: No results for input(s): TSH, T4TOTAL, FREET4, T3FREE, THYROIDAB in the last 72 hours. Anemia Panel: No results for input(s): VITAMINB12, FOLATE, FERRITIN, TIBC, IRON, RETICCTPCT in the last 72 hours. Urine analysis:    Component Value Date/Time   COLORURINE YELLOW 11/14/2016 0345   APPEARANCEUR CLEAR 11/14/2016 0345   LABSPEC 1.010 11/14/2016 0345   PHURINE 5.0 11/14/2016 0345   GLUCOSEU NEGATIVE 11/14/2016 0345   GLUCOSEU  NEGATIVE 05/02/2015 1008   HGBUR NEGATIVE 11/14/2016 0345   BILIRUBINUR NEGATIVE 11/14/2016 0345   KETONESUR NEGATIVE 11/14/2016 0345   PROTEINUR 100 (A) 11/14/2016 0345   UROBILINOGEN 0.2 05/02/2015 1008   NITRITE NEGATIVE 11/14/2016 0345   LEUKOCYTESUR NEGATIVE 11/14/2016 0345   Sepsis Labs: _0 (procalcitonin:4,lacticidven:4)  ) Recent Results (from the past 240 hour(s))  Rapid strep screen     Status: Abnormal   Collection Time: 11/14/16  1:59 AM  Result Value Ref Range Status   Streptococcus, Group A Screen (Direct) POSITIVE (A) NEGATIVE Final      Radiology Studies: Dg Chest 2 View  Result Date: 11/14/2016 CLINICAL DATA:  76 y/o  M; fever. EXAM: CHEST  2 VIEW COMPARISON:  None. FINDINGS: Stable enlarged cardiac silhouette. Aortic atherosclerosis with calcification. Stable diffuse interstitial prominence probably representing edema. No pleural effusion. Dextrocurvature of thoracic spine. IMPRESSION: Stable diffuse interstitial prominence probably representing edema. Enlarged cardiac silhouette. Electronically Signed   By: Kristine Garbe M.D.   On: 11/14/2016 02:52   Ct Soft Tissue Neck W Contrast  Result Date: 11/14/2016 CLINICAL DATA:  76 y/o M; neck pain and fever since last night. History of myeloproliferative disease. EXAM: CT NECK WITH CONTRAST TECHNIQUE: Multidetector CT imaging of the neck was performed using the standard protocol following the bolus administration of intravenous contrast. CONTRAST:  75 cc Isovue-300 COMPARISON:  10/09/2016 cervical MRI FINDINGS: Pharynx and larynx: Normal. No mass or swelling. Salivary glands: No inflammation, mass, or stone. Thyroid: Normal. Lymph nodes: None enlarged or abnormal density. Vascular: Negative. Limited intracranial: Negative. Visualized orbits: Negative. Mastoids and visualized paranasal sinuses: Clear. Skeleton: Diffuse sclerosis of the bones compatible with sequelae of myeloproliferative disease and/or  metabolic bone disease. Cervical spondylosis greatest at the C5 through C7 levels. No acute osseous abnormality is evident. Upper chest: Mosaic attenuation of lung parenchyma compatible small airways or small vessel disease. 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 Other: None. IMPRESSION: 1. No abscess or inflammatory process of the neck identified. 2. Diffuse bony stigmata of myeloproliferative disease and/or metabolic bone disease. 3. Mosaic attenuation of lung parenchyma may represent small airways or small vessel disease. 4. 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 Electronically Signed   By: Kristine Garbe M.D.   On: 11/14/2016 03:29     Scheduled Meds: . aspirin  325 mg Oral Daily  . B-complex with vitamin C  1 tablet Oral Daily  . carvedilol  3.125 mg Oral BID WC  . enoxaparin (LOVENOX) injection  40 mg Subcutaneous Q24H  . feeding supplement (GLUCERNA SHAKE)  118.5 mL Oral Daily  . ferrous gluconate  324 mg Oral Q2200  . hydrALAZINE  25 mg Oral TID  . isosorbide mononitrate  30 mg Oral BID  . methocarbamol  750 mg Oral TID  . sodium chloride flush  3 mL Intravenous Q12H  . torsemide  80 mg Oral Daily   Continuous Infusions: . [START ON 11/15/2016] cefTRIAXone (ROCEPHIN)  IV       LOS: 0 days    Time spent: 30 min    Janece Canterbury, MD [facility] Pager [phone]  If 7PM-7AM, please contact night-coverage [email or web address] Password [email or web address] 11/14/2016, 2:08 PM

## 2016-11-14 NOTE — ED Provider Notes (Signed)
[facility] DEPT Provider Note   CSN: [id number] Arrival date & time: 11/14/16  0045   By signing my name below, I, Evelene Croon, attest that this documentation has been prepared under the direction and in the presence of [place], Annie Main, MD . Electronically Signed: Evelene Croon, Scribe. 11/14/2016. 1:52 AM.  History   Chief Complaint Chief Complaint  Patient presents with  . Neck Pain  . Fever   The history is provided by the patient. No language interpreter was used.     HPI Comments:  Juan [name] is a 76 y.o. male who presents to the Emergency Department via EMS complaining of atraumatic left neck pain that he woke up with yesterday AM (11/13/2016). He denies h/o similar pain. Pt states he was asymptomatic when he went to bed the night befoe symptom onset. Pt reports associated fever with TMAX of 100 en route. He also notes mild dysuria. No sore throat, HA, CP, abdominal pain, hematuria.   Pt wear 2 L home O2 at home. Denies use of anticoagulants.  Discharged 1 week ago after admission for gout and pneumonia.    Past Medical History:  Diagnosis Date  . Allergic rhinitis   . Anemia   . Ascending aortic aneurysm ([place]) 03/2014   4.3cm on CT scan  . CAD (coronary artery disease)    dx elsewheer in past, no documentation. Non-ischemic myoview 2007  . Chronic diastolic CHF (congestive heart failure), NYHA class 2 (HCC)    Normal EF w/ grade 1 dd by echo 12/2015  . Edema    R>L leg, u/s 5-12 neg for DVT  . Hemorrhoid   . History of thrombocytosis   . Hypertension   . Migraine    "once/wk at least" (07/11/2013)  . Myeloproliferative disease ([place])   . Shortness of breath   . Sinus congestion   . Sleep apnea, obstructive    at some point used CPAP, was d/c  years ago  . Type II diabetes mellitus [facility])     Patient Active Problem List   Diagnosis Date Noted  . Arthritis   . Recurrent pneumonia 10/31/2016  . Acute extremity pain 10/31/2016  . HCAP (healthcare-associated  pneumonia) 10/31/2016  . 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 Pulmonary hypertension ([place])   . Sprain of left rotator cuff capsule   . Hyperkalemia   . Acute kidney injury superimposed on CKD ([place])   . Acute on chronic diastolic CHF (congestive heart failure) ([place]) 10/08/2016  . Fever 10/08/2016  . Left arm pain 10/08/2016  . CHF (congestive heart failure) ([place]) 05/06/2016  . Paroxysmal SVT (supraventricular tachycardia) ([place]) 04/20/2016  . Type II diabetes mellitus ([place]) 04/20/2016  . Gout 04/20/2016  . Hyperuricemia 04/20/2016  . B12 deficiency 04/20/2016  . 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 Hydronephrosis   . Thrombocytosis ([place]) 04/07/2016  . Anemia 04/07/2016  . Community acquired pneumonia 04/01/2016  . Pneumonia 04/01/2016  . Pressure injury of skin 04/01/2016  . Pressure ulcer stage II 12/31/2015  . UTI (urinary tract infection) 12/30/2015  . Sepsis ([place]) 12/29/2015  . UTI (lower urinary tract infection) 12/29/2015  . T wave inversion in EKG 12/29/2015  . PCP NOTES >>>>>>>>>>>>>>>>> 05/02/2015  . Arm pain   . Peripheral edema   . Unable to ambulate   . Thoracic aortic aneurysm ([place]) 04/26/2014  . CKD (chronic kidney disease) stage 3, GFR 30-59 ml/min 04/26/2014  . 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 Chest pain 04/15/2014  . Encounter  for palliative care 04/06/2014  . Weakness generalized 04/06/2014  . Venous stasis dermatitis of both lower extremities   . Malnutrition of moderate degree ([place]) 03/31/2014  . Morbid obesity due to excess calories ([place]) 03/29/2014  . 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 Poor compliance with advise  05/05/2012  . Annual physical exam 01/14/2012  . Weight loss 01/14/2012  . BPH (benign prostatic hyperplasia) 01/14/2012  . *Chronic lower extremity edema, stasis dermatitis 06/09/2011  . CARDIOMEGALY 04/29/2009  . *Prediabetes 09/07/2006  . Symptomatic anemia 09/07/2006  .  Myeloproliferative disease ([place]) 09/07/2006  . Obstructive sleep apnea 09/07/2006  . Essential hypertension 09/07/2006  . Coronary atherosclerosis 09/07/2006  . *CHF-- PCP comments 09/07/2006  . Allergic rhinitis 09/07/2006    Past Surgical History:  Procedure Laterality Date  . TOE SURGERY Right    "tried to straighten out big toe" (07/11/2013)       Home Medications    Prior to Admission medications   Medication Sig Start Date End Date Taking? Authorizing Provider  acetaminophen (TYLENOL) 500 MG tablet Take 500-1,000 mg by mouth every 6 (six) hours as needed for moderate pain or headache.     [provider]  amoxicillin-clavulanate (AUGMENTIN) 500-125 MG tablet Take 1 tablet (500 mg total) by mouth every 12 (twelve) hours. 11/04/16   Theodis Blaze, MD  aspirin 325 MG tablet Take 325 mg by mouth daily.    [provider]  B Complex Vitamins (VITAMIN B COMPLEX PO) Take 100 mg by mouth daily.    [provider]  bisacodyl (DULCOLAX) 10 MG suppository Place 1 suppository (10 mg total) rectally daily as needed for moderate constipation. 04/18/16   Modena Jansky, MD  carvedilol (COREG) 3.125 MG tablet Take 1 tablet (3.125 mg total) by mouth 2 (two) times daily with a meal. 10/02/16   Tillery, Satira Mccallum, PA-C  colchicine 0.6 MG tablet Take 1 tablet (0.6 mg total) by mouth daily as needed. 11/04/16   Theodis Blaze, MD  diclofenac sodium (VOLTAREN) 1 % GEL Apply 4 g topically 2 (two) times daily as needed (pain). Apply to bilateral  knees     [provider]  docusate sodium (COLACE) 100 MG capsule Take 1 capsule (100 mg total) by mouth 2 (two) times daily as needed for mild constipation. Patient not taking: Reported on 11/02/2016 10/17/16   Rama, Venetia Maxon, MD  feeding supplement, GLUCERNA SHAKE, (GLUCERNA SHAKE) LIQD Take 118.5 mLs by mouth daily. Reported on 08/21/2015    [provider]  FERROUS GLUCONATE PO Take 240 mg by mouth daily at  10 pm. 1 tablet    [provider]  fluticasone (FLONASE) 50 MCG/ACT nasal spray Place 2 sprays into both nostrils daily. Reported on 05/02/2015    [provider]  gabapentin (NEURONTIN) 100 MG capsule Take 2 capsules (200 mg total) by mouth 3 (three) times daily as needed (pain). Reported on 08/22/2015 10/17/16   Rama, Venetia Maxon, MD  hydrALAZINE (APRESOLINE) 25 MG tablet Take 25 mg by mouth 3 (three) times daily.     [provider]  hydrocerin (EUCERIN) CREA Apply Eucerin cream to BLE Q day after bathing and roughly towel drying to remove loose skin Patient taking differently: Apply 1 application topically daily as needed (dryness).  01/27/14   Ghimire, Henreitta Leber, MD  HYDROcodone-acetaminophen (NORCO/VICODIN) 5-325 MG tablet Take 1-2 tablets by mouth every 6 (six) hours as needed for moderate pain. 11/04/16   Theodis Blaze, MD  isosorbide mononitrate (IMDUR) 30 MG 24 hr tablet Take 30 mg by mouth 2 (two) times daily.    [provider]  lidocaine (XYLOCAINE) 5 % ointment Apply topically 3 (three) times daily. Patient not taking: Reported on 11/02/2016 10/17/16   Rama, Venetia Maxon, MD  methocarbamol (ROBAXIN) 750 MG tablet Take 1 tablet (750 mg total) by mouth 3 (three) times daily. 10/17/16   Rama, Venetia Maxon, MD  metolazone (ZAROXOLYN) 2.5 MG tablet Take 1 tablet (2.5 mg total) by mouth as directed. Patient taking differently: Take 2.5 mg by mouth daily.  10/23/16 10/23/17  Larey Dresser, MD  OXYGEN Inhale 2 L into the lungs continuous.    [provider]  potassium chloride SA (K-DUR,KLOR-CON) 20 MEQ tablet Take 20 mEq by mouth daily. Taken with metolazone.     [provider]  simethicone (MYLICON) 80 MG chewable tablet Chew 80 mg by mouth every 6 (six) hours as needed for flatulence.    [provider]  torsemide (DEMADEX) 20 MG tablet Take 4 tablets (80 mg total) by mouth daily. 10/23/16 01/21/17  Larey Dresser, MD    Family  History Family History  Problem Relation Age of Onset  . Schizophrenia Son   . Colon cancer Neg Hx   . Prostate cancer Neg Hx   . Heart attack Neg Hx   . Diabetes Neg Hx     Social History Social History  Substance Use Topics  . Smoking status: Former Smoker    Packs/day: 0.25    Years: 12.00    Types: Cigarettes    Quit date: 05/18/1966  . Smokeless tobacco: Never Used     Comment: quit smoking 45 years ago  . Alcohol use No     Allergies   Patient has no known allergies.   Review of Systems Review of Systems All systems reviewed and are negative for acute change except as noted in the HPI.   Physical Exam Updated Vital Signs BP 137/78   Pulse 87   Temp 98.5 F (36.9 C) (Oral)   Resp 17   SpO2 98%   Physical Exam  Constitutional: He is oriented to person, place, and time. He appears well-developed and well-nourished. No distress.  HENT:  Head: Normocephalic and atraumatic.  Mouth/Throat: Oropharynx is clear and moist. No oropharyngeal exudate.  Eyes: Conjunctivae and EOM are normal. Pupils are equal, round, and reactive to light.  OP erythematous, no exudates.  Neck: Neck supple. No tracheal deviation present.  No pain with neck flexion. No posterior neck pain. TTP of left lateral neck without erythema or fluctuance Reduced ROM due to pain  No temporal artery tenderness   Cardiovascular: Normal rate, regular rhythm, normal heart sounds and intact distal pulses.   No murmur heard. Pulmonary/Chest: Breath sounds normal. No stridor. Tachypnea (mild) noted. No respiratory distress. He exhibits no tenderness.  Abdominal: Soft. There is no tenderness. There is no rebound and no guarding.  Musculoskeletal: He exhibits edema and tenderness.  Swelling to left dorsal wrist.   Lymphadenopathy:    He has no cervical adenopathy.  Neurological: He is alert and oriented to person, place, and time. No cranial nerve deficit. He exhibits normal muscle tone. Coordination  normal.  No ataxia on finger to nose bilaterally. No pronator drift. 5/5 strength throughout. CN 2-12 intact.Equal grip strength. Sensation intact.   Skin: Skin is warm.  Feels warm   Psychiatric: He has a normal mood and affect. His behavior is normal.  Nursing  note and vitals reviewed.    ED Treatments / Results  DIAGNOSTIC STUDIES:  Oxygen Saturation is 98% on 2L [place], normal by my interpretation.    COORDINATION OF CARE:  1:54 AM Discussed treatment plan with pt at bedside and pt agreed to plan.  Labs (all labs ordered are listed, but only abnormal results are displayed) Labs Reviewed  RAPID STREP SCREEN (NOT AT [facility] - Abnormal; Notable for the following:       Result Value   Streptococcus, Group A Screen (Direct) POSITIVE (*)    All other components within normal limits  CBC WITH DIFFERENTIAL/PLATELET - Abnormal; Notable for the following:    WBC 12.8 (*)    RBC 3.72 (*)    Hemoglobin 9.4 (*)    HCT 30.5 (*)    MCH 25.3 (*)    RDW 18.8 (*)    Platelets 432 (*)    Monocytes Absolute 2.8 (*)    Basophils Absolute 0.4 (*)    All other components within normal limits  COMPREHENSIVE METABOLIC PANEL - Abnormal; Notable for the following:    Glucose, Bld 122 (*)    BUN 65 (*)    Total Protein 5.9 (*)    Albumin 3.0 (*)    ALT 13 (*)    All other components within normal limits  URINALYSIS, ROUTINE W REFLEX MICROSCOPIC - Abnormal; Notable for the following:    Protein, ur 100 (*)    All other components within normal limits  PROTIME-INR - Abnormal; Notable for the following:    Prothrombin Time 16.0 (*)    All other components within normal limits  TROPONIN I - Abnormal; Notable for the following:    Troponin I 0.03 (*)    All other components within normal limits  SEDIMENTATION RATE - Abnormal; Notable for the following:    Sed Rate 55 (*)    All other components within normal limits  BASIC METABOLIC PANEL - Abnormal; Notable for the following:    Glucose, Bld 121  (*)    BUN 64 (*)    All other components within normal limits  CBC - Abnormal; Notable for the following:    WBC 12.7 (*)    Hemoglobin 10.7 (*)    HCT 35.5 (*)    MCH 25.1 (*)    RDW 18.8 (*)    Platelets 401 (*)    All other components within normal limits  TROPONIN I - Abnormal; Notable for the following:    Troponin I 0.03 (*)    All other components within normal limits  LIPID PANEL - Abnormal; Notable for the following:    LDL Cholesterol 106 (*)    All other components within normal limits  BRAIN NATRIURETIC PEPTIDE - Abnormal; Notable for the following:    B Natriuretic Peptide 186.6 (*)    All other components within normal limits  GLUCOSE, CAPILLARY - Abnormal; Notable for the following:    Glucose-Capillary 108 (*)    All other components within normal limits  I-STAT CG4 LACTIC ACID, ED - Abnormal; Notable for the following:    Lactic Acid, Venous 2.10 (*)    All other components within normal limits  CULTURE, BLOOD (ROUTINE X 2)  CULTURE, BLOOD (ROUTINE X 2)  URINE CULTURE  TROPONIN I  TROPONIN I  I-STAT CG4 LACTIC ACID, ED    EKG  EKG Interpretation  Date/Time:  Saturday November 14 2016 02:09:06 EDT Ventricular Rate:  84 PR Interval:    QRS Duration: 102  QT Interval:  393 QTC Calculation: 465 R Axis:   -12 Text Interpretation:  Sinus rhythm Minimal ST depression Baseline wander in lead(s) V2 No significant change was found Confirmed by Ezequiel Essex [phone]) on 11/14/2016 2:40:45 AM       Radiology Dg Chest 2 View  Result Date: 11/14/2016 CLINICAL DATA:  76 y/o  M; fever. EXAM: CHEST  2 VIEW COMPARISON:  None. FINDINGS: Stable enlarged cardiac silhouette. Aortic atherosclerosis with calcification. Stable diffuse interstitial prominence probably representing edema. No pleural effusion. Dextrocurvature of thoracic spine. IMPRESSION: Stable diffuse interstitial prominence probably representing edema. Enlarged cardiac silhouette. Electronically Signed   By:  Kristine Garbe M.D.   On: 11/14/2016 02:52   Ct Soft Tissue Neck W Contrast  Result Date: 11/14/2016 CLINICAL DATA:  76 y/o M; neck pain and fever since last night. History of myeloproliferative disease. EXAM: CT NECK WITH CONTRAST TECHNIQUE: Multidetector CT imaging of the neck was performed using the standard protocol following the bolus administration of intravenous contrast. CONTRAST:  75 cc Isovue-300 COMPARISON:  10/09/2016 cervical MRI FINDINGS: Pharynx and larynx: Normal. No mass or swelling. Salivary glands: No inflammation, mass, or stone. Thyroid: Normal. Lymph nodes: None enlarged or abnormal density. Vascular: Negative. Limited intracranial: Negative. Visualized orbits: Negative. Mastoids and visualized paranasal sinuses: Clear. Skeleton: Diffuse sclerosis of the bones compatible with sequelae of myeloproliferative disease and/or metabolic bone disease. Cervical spondylosis greatest at the C5 through C7 levels. No acute osseous abnormality is evident. Upper chest: Mosaic attenuation of lung parenchyma compatible small airways or small vessel disease. 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 Other: None. IMPRESSION: 1. No abscess or inflammatory process of the neck identified. 2. Diffuse bony stigmata of myeloproliferative disease and/or metabolic bone disease. 3. Mosaic attenuation of lung parenchyma may represent small airways or small vessel disease. 4. 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 Electronically Signed   By: Kristine Garbe M.D.   On: 11/14/2016 03:29    Procedures Procedures (including critical care time)  Medications Ordered in ED Medications  sodium chloride 0.9 % bolus 500 mL (not administered)     Initial Impression / Assessment and Plan / ED Course  I have reviewed the triage vital signs and the nursing notes.  Pertinent labs & imaging results that were available during my care of the  patient were reviewed by me and considered in my medical decision making (see chart for details).    Patient presents with severe left-sided neck pain that he noticed upon waking up today. Also reports fever at home to 100. Denies any cough, runny nose or sore throat.  Labs with mild lactate elevation, leukocytosis, positive strep. IV rocephin given after cultures obtained. IVF given.  CT neck negative for abscess or cellulitis.  CXR negative. Mild troponin elevated. EKG stable, no chest pain.  3:52 AM Pt updated with results and plan to admit and agrees  Suspect neck pain and fever due to strep pharyngitis. Possible early sepsis. Doubt meningitis at this time as patient able to range neck. Pain is lateral and not posterior. Doubt temporal arteritis.   Observation admission d/w Dr. Blaine Hamper.  Final Clinical Impressions(s) / ED Diagnoses   Final diagnoses:  Strep pharyngitis  Fever, unspecified fever cause    New Prescriptions New Prescriptions   No medications on file  I personally performed the services described in this documentation, which was scribed in my presence. The recorded information has been reviewed and is accurate.     B[place] Holst,  Annie Main, MD 11/14/16 [phone]

## 2016-11-14 NOTE — Progress Notes (Signed)
Received pt.from ED nurse AAOX4;bec.of fever,neck pain.no resp.distress noted.V/S taken & recorded.Placed on tele.& made comfortable in bed.Will continue to monitor pt.

## 2016-11-14 NOTE — ED Notes (Signed)
Pt repositioned. Given juice

## 2016-11-14 NOTE — H&P (Addendum)
History and Physical    Juan [name] VWU:[id number] DOB: 06/21/1940 DOA: 11/14/2016  Referring MD/NP/PA:   PCP: Colon Branch, MD   Patient coming from:  The patient is coming from home.  At baseline, pt is partially dependent for most of ADL.   Chief Complaint: neck pain and fever  HPI: Juan [name] is a 76 y.o. male with medical history significant of hypertension, diet-controlled diabetes, chronic respiratory failure on 2 L oxygen, OSA not on CPAP, gout, myeloproliferative disease, dCHF, CAD, ascending aortic aneurysm, anemia, CKD-3, who presents with neck pain and the fever.  Pt wa recently hospitalized from 06/16-6/20 due to acute gout and pneumonia. Patient was discharged in stable condition. He has been doing fine until 2 yesterdays when he began to have fever and neck pain. He had a fever of100 at home. He states that his neck pain is located in the left side of the neck, constant, 8 out of 10 in severity, sharp, nonradiating. Denies any injury. Patient has a chronic cough and shortness of breath, which have not changed. Denies any chest pain. He does not have sore throat, runny nose. Patient reports dysuria and burning on urination. Denies nausea, vomiting, diarrhea, abdominal pain.  ED Course: pt was found to have positive rapid strep, ESR 55, WBC 12.8, lactic acid 0.72, INR 1.27, troponin positive at 0.03, negative urinalysis, stable renal function, temperature normal, no tachycardia, oxygen sat 95% on 2 L nasal cannula oxygen, chest x-ray without infiltration showed mild pulmonary edema. Pt is placed on tele bed for obs.  CT of neck soft tissue showed 1. No abscess or inflammatory process of the neck identified. 2. Diffuse bony stigmata of myeloproliferative disease and/or metabolic bone disease. 3. Mosaic attenuation of lung parenchyma may represent small airways or small vessel disease. 4. 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  Review of Systems:    General: has fevers, chills, no changes in body weight, has poor appetite, has fatigue HEENT: no blurry vision, hearing changes or sore throat. Has neck pain Respiratory: has dyspnea, coughing, no wheezing CV: no chest pain, no palpitations GI: no nausea, vomiting, abdominal pain, diarrhea, constipation GU: has dysuria, burning on urination, no increased urinary frequency, hematuria  Ext: no leg edema Neuro: no unilateral weakness, numbness, or tingling, no vision change or hearing loss Skin: no rash, no skin tear. MSK:  no deformity, no limitation of range of movement in spin Heme: No easy bruising.  Travel history: No recent long distant travel.  Allergy: No Known Allergies  Past Medical History:  Diagnosis Date  . Allergic rhinitis   . Anemia   . Ascending aortic aneurysm ([place]) 03/2014   4.3cm on CT scan  . CAD (coronary artery disease)    dx elsewheer in past, no documentation. Non-ischemic myoview 2007  . Chronic diastolic CHF (congestive heart failure), NYHA class 2 (HCC)    Normal EF w/ grade 1 dd by echo 12/2015  . Edema    R>L leg, u/s 5-12 neg for DVT  . Hemorrhoid   . History of thrombocytosis   . Hypertension   . Migraine    "once/wk at least" (07/11/2013)  . Myeloproliferative disease ([place])   . Shortness of breath   . Sinus congestion   . Sleep apnea, obstructive    at some point used CPAP, was d/c  years ago  . Type II diabetes mellitus ([place])     Past Surgical History:  Procedure Laterality Date  . TOE SURGERY Right    "  tried to straighten out big toe" (07/11/2013)    Social History:  reports that he quit smoking about 50 years ago. His smoking use included Cigarettes. He has a 3.00 pack-year smoking history. He has never used smokeless tobacco. He reports that he does not drink alcohol or use drugs.  Family History:  Family History  Problem Relation Age of Onset  . Schizophrenia Son   . Colon cancer Neg Hx   . Prostate cancer Neg Hx   . Heart  attack Neg Hx   . Diabetes Neg Hx      Prior to Admission medications   Medication Sig Start Date End Date Taking? Authorizing Provider  acetaminophen (TYLENOL) 500 MG tablet Take 500-1,000 mg by mouth every 6 (six) hours as needed for moderate pain or headache.     [provider]  amoxicillin-clavulanate (AUGMENTIN) 500-125 MG tablet Take 1 tablet (500 mg total) by mouth every 12 (twelve) hours. 11/04/16   Theodis Blaze, MD  aspirin 325 MG tablet Take 325 mg by mouth daily.    [provider]  B Complex Vitamins (VITAMIN B COMPLEX PO) Take 100 mg by mouth daily.    [provider]  bisacodyl (DULCOLAX) 10 MG suppository Place 1 suppository (10 mg total) rectally daily as needed for moderate constipation. 04/18/16   Modena Jansky, MD  carvedilol (COREG) 3.125 MG tablet Take 1 tablet (3.125 mg total) by mouth 2 (two) times daily with a meal. 10/02/16   Tillery, Satira Mccallum, PA-C  colchicine 0.6 MG tablet Take 1 tablet (0.6 mg total) by mouth daily as needed. 11/04/16   Theodis Blaze, MD  diclofenac sodium (VOLTAREN) 1 % GEL Apply 4 g topically 2 (two) times daily as needed (pain). Apply to bilateral  knees     [provider]  docusate sodium (COLACE) 100 MG capsule Take 1 capsule (100 mg total) by mouth 2 (two) times daily as needed for mild constipation. Patient not taking: Reported on 11/02/2016 10/17/16   Rama, Venetia Maxon, MD  feeding supplement, GLUCERNA SHAKE, (GLUCERNA SHAKE) LIQD Take 118.5 mLs by mouth daily. Reported on 08/21/2015    [provider]  FERROUS GLUCONATE PO Take 240 mg by mouth daily at 10 pm. 1 tablet    [provider]  fluticasone (FLONASE) 50 MCG/ACT nasal spray Place 2 sprays into both nostrils daily. Reported on 05/02/2015    [provider]  gabapentin (NEURONTIN) 100 MG capsule Take 2 capsules (200 mg total) by mouth 3 (three) times daily as needed (pain). Reported on 08/22/2015 10/17/16   Rama, Venetia Maxon, MD  hydrALAZINE (APRESOLINE) 25 MG tablet Take 25 mg by mouth 3 (three) times daily.     [provider]  hydrocerin (EUCERIN) CREA Apply Eucerin cream to BLE Q day after bathing and roughly towel drying to remove loose skin Patient taking differently: Apply 1 application topically daily as needed (dryness).  01/27/14   Ghimire, Henreitta Leber, MD  HYDROcodone-acetaminophen (NORCO/VICODIN) 5-325 MG tablet Take 1-2 tablets by mouth every 6 (six) hours as needed for moderate pain. 11/04/16   Theodis Blaze, MD  isosorbide mononitrate (IMDUR) 30 MG 24 hr tablet Take 30 mg by mouth 2 (two) times daily.    [provider]  lidocaine (XYLOCAINE) 5 % ointment Apply topically 3 (three) times daily. Patient not taking: Reported on 11/02/2016 10/17/16   Rama, Venetia Maxon, MD  methocarbamol (ROBAXIN) 750 MG tablet Take 1 tablet (750 mg total) by  mouth 3 (three) times daily. 10/17/16   Rama, Venetia Maxon, MD  metolazone (ZAROXOLYN) 2.5 MG tablet Take 1 tablet (2.5 mg total) by mouth as directed. Patient taking differently: Take 2.5 mg by mouth daily.  10/23/16 10/23/17  Larey Dresser, MD  OXYGEN Inhale 2 L into the lungs continuous.    [provider]  potassium chloride SA (K-DUR,KLOR-CON) 20 MEQ tablet Take 20 mEq by mouth daily. Taken with metolazone.     [provider]  simethicone (MYLICON) 80 MG chewable tablet Chew 80 mg by mouth every 6 (six) hours as needed for flatulence.    [provider]  torsemide (DEMADEX) 20 MG tablet Take 4 tablets (80 mg total) by mouth daily. 10/23/16 01/21/17  Larey Dresser, MD    Physical Exam: Vitals:   11/14/16 0330 11/14/16 0338 11/14/16 0345 11/14/16 0400  BP: (!) 145/77  (!) 147/77 (!) 151/75  Pulse:  90 89 88  Resp:  '17 16 17  '$ Temp:      TempSrc:      SpO2:  99% 99% 99%   General: Not in acute distress HEENT: has tenderness over left side of neck, no erythema or swelling.       Eyes: PERRL, EOMI, no scleral icterus.        ENT: No discharge from the ears and nose, has mild pharynx injection, no tonsillar enlargement and draining.       Neck: No JVD, no bruit, no mass felt. Heme: No neck lymph node enlargement. Cardiac: S1/S2, RRR, No murmurs, No gallops or rubs. Respiratory: No rales, wheezing, rhonchi or rubs. GI: Soft, nondistended, nontender, no rebound pain, no organomegaly, BS present. GU: No hematuria Ext: No pitting leg edema bilaterally. 2+DP/PT pulse bilaterally. Musculoskeletal: No joint deformities, No joint redness or warmth, no limitation of ROM in spin. Skin: No rashes.  Neuro: Alert, oriented X3, cranial nerves II-XII grossly intact, moves all extremities normally.   Psych: Patient is not psychotic, no suicidal or hemocidal ideation.  Labs on Admission: I have personally reviewed following labs and imaging studies  CBC:  Recent Labs Lab 11/14/16 0154  WBC 12.8*  NEUTROABS 7.3  HGB 9.4*  HCT 30.5*  MCV 82.0  PLT 361*   Basic Metabolic Panel:  Recent Labs Lab 11/14/16 0154  NA 139  K 3.6  CL 103  CO2 27  GLUCOSE 122*  BUN 65*  CREATININE 1.13  CALCIUM 9.4   GFR: Estimated Creatinine Clearance: 69.7 mL/min (by C-G formula based on SCr of 1.13 mg/dL). Liver Function Tests:  Recent Labs Lab 11/14/16 0154  AST 25  ALT 13*  ALKPHOS 105  BILITOT 0.5  PROT 5.9*  ALBUMIN 3.0*   No results for input(s): LIPASE, AMYLASE in the last 168 hours. No results for input(s): AMMONIA in the last 168 hours. Coagulation Profile:  Recent Labs Lab 11/14/16 0158  INR 1.27   Cardiac Enzymes:  Recent Labs Lab 11/14/16 0158  TROPONINI 0.03*   BNP (last 3 results) No results for input(s): PROBNP in the last 8760 hours. HbA1C: No results for input(s): HGBA1C in the last 72 hours. CBG: No results for input(s): GLUCAP in the last 168 hours. Lipid Profile: No results for input(s): CHOL, HDL, LDLCALC, TRIG, CHOLHDL, LDLDIRECT in the last 72 hours. Thyroid Function Tests: No  results for input(s): TSH, T4TOTAL, FREET4, T3FREE, THYROIDAB in the last 72 hours. Anemia Panel: No results for input(s): VITAMINB12, FOLATE, FERRITIN, TIBC, IRON, RETICCTPCT in the last 72 hours.  Urine analysis:    Component Value Date/Time   COLORURINE YELLOW 11/14/2016 0345   APPEARANCEUR CLEAR 11/14/2016 0345   LABSPEC 1.010 11/14/2016 0345   PHURINE 5.0 11/14/2016 0345   GLUCOSEU NEGATIVE 11/14/2016 0345   GLUCOSEU NEGATIVE 05/02/2015 1008   HGBUR NEGATIVE 11/14/2016 0345   BILIRUBINUR NEGATIVE 11/14/2016 0345   KETONESUR NEGATIVE 11/14/2016 0345   PROTEINUR 100 (A) 11/14/2016 0345   UROBILINOGEN 0.2 05/02/2015 1008   NITRITE NEGATIVE 11/14/2016 0345   LEUKOCYTESUR NEGATIVE 11/14/2016 0345   Sepsis Labs: '@LABRCNTIP'$ (procalcitonin:4,lacticidven:4) ) Recent Results (from the past 240 hour(s))  Rapid strep screen     Status: Abnormal   Collection Time: 11/14/16  1:59 AM  Result Value Ref Range Status   Streptococcus, Group A Screen (Direct) POSITIVE (A) NEGATIVE Final     Radiological Exams on Admission: Dg Chest 2 View  Result Date: 11/14/2016 CLINICAL DATA:  76 y/o  M; fever. EXAM: CHEST  2 VIEW COMPARISON:  None. FINDINGS: Stable enlarged cardiac silhouette. Aortic atherosclerosis with calcification. Stable diffuse interstitial prominence probably representing edema. No pleural effusion. Dextrocurvature of thoracic spine. IMPRESSION: Stable diffuse interstitial prominence probably representing edema. Enlarged cardiac silhouette. Electronically Signed   By: Kristine Garbe M.D.   On: 11/14/2016 02:52   Ct Soft Tissue Neck W Contrast  Result Date: 11/14/2016 CLINICAL DATA:  76 y/o M; neck pain and fever since last night. History of myeloproliferative disease. EXAM: CT NECK WITH CONTRAST TECHNIQUE: Multidetector CT imaging of the neck was performed using the standard protocol following the bolus administration of intravenous contrast. CONTRAST:  75 cc Isovue-300  COMPARISON:  10/09/2016 cervical MRI FINDINGS: Pharynx and larynx: Normal. No mass or swelling. Salivary glands: No inflammation, mass, or stone. Thyroid: Normal. Lymph nodes: None enlarged or abnormal density. Vascular: Negative. Limited intracranial: Negative. Visualized orbits: Negative. Mastoids and visualized paranasal sinuses: Clear. Skeleton: Diffuse sclerosis of the bones compatible with sequelae of myeloproliferative disease and/or metabolic bone disease. Cervical spondylosis greatest at the C5 through C7 levels. No acute osseous abnormality is evident. Upper chest: Mosaic attenuation of lung parenchyma compatible small airways or small vessel disease. 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 Other: None. IMPRESSION: 1. No abscess or inflammatory process of the neck identified. 2. Diffuse bony stigmata of myeloproliferative disease and/or metabolic bone disease. 3. Mosaic attenuation of lung parenchyma may represent small airways or small vessel disease. 4. 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 Electronically Signed   By: Kristine Garbe M.D.   On: 11/14/2016 03:29     EKG: Independently reviewed.  Sinus rhythm, QTC 465, LAD, mild T-wave inversion in V1-V3.  Assessment/Plan Principal Problem:   Neck pain Active Problems:   Essential hypertension   Coronary atherosclerosis   Chronic diastolic (congestive) heart failure (HCC)   CKD (chronic kidney disease) stage 3, GFR 30-59 ml/min   Agnogenic myeloid metaplasia (HCC)   Anemia   Gout   Fever   Elevated troponin   Neck pain: Etiology is not clear. CT of neck soft tissue showed no abscess or inflammatory process of the neck, but showed diffuse bony stigmata of myeloproliferative disease and/or metabolic bone disease. Pt may also have neck muscle straining. Another potential differential diagnosis is Lemirerre's syndrom (Jugular vein septic thrombophlebitis) given  positive strep test, CT findings are not suggestive for this diagnosis.  -will place on tele bed for obs -continue home Norco prn and tylenol -continue Robaxin  Fever: pt had fever 100 at home. His temperature is 98.5  in ED. Etiology is not clear. Patient has leukocytosis with WBC 12.8. Urinalysis is negative. Chest x-rays negative for infiltration. Pt denies sore throat or runny nose, but he has positive rapid strep test and mild pharynx injection indicating possible strep throat. Patient's lactic acid is normal. Does not meet criteria for sepsis currently. Hemodynamically stable -IV rocephin -f/u blood culture -prn tylenol for fever  HTN:  -continue Coreg, hydralazine -prn IV hydralazine -Hold torsemide and metolazone  Coronary atherosclerosis and positive Trop: Patient troponin is 0.03, but denies any chest pain. Likely due to demand ischemia secondary to fever. - cycle CE q6 x3 and repeat EKG in the am  -continue aspirin and imdur - Risk factor stratification: will check FLP (A1C was 5.8 on 07/28/16)  CKD-III: Stable. Baseline creatinine 1.2-1.5. His creatinine is 1.13, but BUN 68, indicating volume depletion. -received 500 cc NS in ED -f/u renal function by BMP  Agnogenic myeloid metaplasia: per previous discharge summary. "Has seen Dr. Marin Olp in the past (consult note 05/07/16 reviewed). Apparently he has refused a bone marrow biopsy and does not want any treatment for this".  Anemia: Hemoglobin stable. 9.2 on 11/05/14--> 9.4 today. -Continue iron supplement -Follow-up CBC  Gout: -continue prn colchicine  Chronic diastolic congestive heart failure: 2-D echo on 10/14/16 showed EF 50-60 percent with grade 1 diastolic dysfunction. Patient does not have leg edema JVD. CHF is compensated. -Continue home metolazone and torsemide -Continue aspirin, Coreg -Check BNP   DVT ppx: SQ Lovenox Code Status: Full code Family Communication: None at bed side.   Disposition Plan:   Anticipate discharge back to previous home environment Consults called:  none Admission status: Obs / tele     Date of Service 11/14/2016    Ivor Costa [facility] Pager [phone]  If 7PM-7AM, please contact night-coverage [email or web address] Password [email or web address] 11/14/2016, 5:19 AM

## 2016-11-14 NOTE — Progress Notes (Signed)
PT Cancellation Note  Patient Details Name: Juan [name] MRN: [id number] DOB: 16-Oct-1940   Cancelled Treatment:    Reason Eval/Treat Not Completed: Patient declined, no reason specified.  Pt reports feeling too bad to try to move today, noted his L wrist brace and pt states he was on RW previously with care for wrist.  Will retry in the AM.   Ramond Dial 11/14/2016, 2:59 PM   Mee Hives, PT MS Acute Rehab Dept. Number: [place] and [place]

## 2016-11-14 NOTE — ED Notes (Signed)
Pt linens changed, brief changed. Pt repositioned. Given juice.

## 2016-11-15 LAB — BASIC METABOLIC PANEL
Anion gap: 9 (ref 5–15)
BUN: 56 mg/dL — AB (ref 6–20)
CALCIUM: 9.2 mg/dL (ref 8.9–10.3)
CO2: 26 mmol/L (ref 22–32)
CREATININE: 1.22 mg/dL (ref 0.61–1.24)
Chloride: 101 mmol/L (ref 101–111)
GFR, EST NON AFRICAN AMERICAN: 57 mL/min — AB (ref >60)
Glucose, Bld: 107 mg/dL — ABNORMAL HIGH (ref 65–99)
Potassium: 4 mmol/L (ref 3.5–5.1)
SODIUM: 136 mmol/L (ref 135–145)

## 2016-11-15 LAB — CBC
HCT: 27.4 % — ABNORMAL LOW (ref 39.0–52.0)
Hemoglobin: 8.5 g/dL — ABNORMAL LOW (ref 13.0–17.0)
MCH: 25.6 pg — ABNORMAL LOW (ref 26.0–34.0)
MCHC: 31 g/dL (ref 30.0–36.0)
MCV: 82.5 fL (ref 78.0–100.0)
PLATELETS: 368 10*3/uL (ref 150–400)
RBC: 3.32 MIL/uL — ABNORMAL LOW (ref 4.22–5.81)
RDW: 18.9 % — AB (ref 11.5–15.5)
WBC: 10.8 10*3/uL — AB (ref 4.0–10.5)

## 2016-11-15 LAB — URINE CULTURE

## 2016-11-15 MED ORDER — METOLAZONE 2.5 MG PO TABS
2.5000 mg | ORAL_TABLET | Freq: Every day | ORAL | Status: DC
  Administered 2016-11-16: 2.5 mg via ORAL
  Filled 2016-11-15: qty 1

## 2016-11-15 NOTE — Evaluation (Signed)
Occupational Therapy Evaluation Patient Details Name: Juan [name] MRN: [id number] DOB: 06/15/1940 Today's Date: 11/15/2016    History of Present Illness (P) Juan [name] is a 76 y.o. male with medical history significant for but not limited to myeloproliferative disorder presenting with several days history of progressively worsening severe left wrist aching pain radiating to ipsilateral lower forearm associated with swelling of the left wrist, unable to touch it, not relieved by home remedy. Patient was admitted for acute CHF and left upper extremity pain; likely acute gouty arthritis; working diagnosis of pneumonia as well   Clinical Impression   Pt admitted with above. He demonstrates the below listed deficits and will benefit from continued OT to maximize safety and independence with BADLs.   Pt presents to OT with generalized weakness, impaired balance, limited ROM and functional use of bil. UEs and c/o significant pain which limits all aspects of ADLs and functional mobility.  He requires max constant, and gentle coaxing to participate in any activity due to fear of increasing pain.  He demonstrates significant soft tissue tightness throughout trunk, bil. Shoulders, and neck.  Encouraged gentle ROM and use of heat (given diagnosis soft tissue work is contraindicated).   Feel he will benefit from SNF level rehab to maximize safety and independence with ADLs.  Will follow acutely.        Follow Up Recommendations  SNF    Equipment Recommendations  None recommended by OT    Recommendations for Other Services       Precautions / Restrictions Precautions Precautions: (P) Fall Restrictions Weight Bearing Restrictions: (P) No      Mobility Bed Mobility Overal bed mobility: Needs Assistance Bed Mobility: Supine to Sit;Sit to Supine     Supine to sit: Max assist;+2 for physical assistance Sit to supine: Max assist   General bed mobility comments: Pt requires max coaxing and  encouragement to move to EOB.  He requires assist with all aspects to prevent increase in pain     Transfers Overall transfer level: Needs assistance Equipment used: Rolling walker (2 wheeled) Transfers: Sit to/from Stand Sit to Stand: Mod assist;+2 physical assistance         General transfer comment: Pt requires asssit to power up into standing.  Min A +2 to side step up EOB     Balance Overall balance assessment: Needs assistance Sitting-balance support: Feet supported;No upper extremity supported Sitting balance-Leahy Scale: Poor Sitting balance - Comments: Pt requires min - mod A EOB.  He demonstrates posterior lean due to pain  Postural control: Posterior lean Standing balance support: Bilateral upper extremity supported Standing balance-Leahy Scale: Poor Standing balance comment: requires min A and bil UE support                            ADL either performed or assessed with clinical judgement   ADL Overall ADL's : Needs assistance/impaired Eating/Feeding: Set up;Bed level   Grooming: Wash/dry hands;Wash/dry face;Oral care;Moderate assistance;Bed level   Upper Body Bathing: Maximal assistance;Bed level   Lower Body Bathing: Total assistance;Bed level   Upper Body Dressing : Total assistance;Sitting   Lower Body Dressing: Total assistance;Bed level   Toilet Transfer: Minimal assistance;+2 for safety/equipment;Stand-pivot;BSC;RW   Toileting- Clothing Manipulation and Hygiene: Total assistance;Sit to/from stand       Functional mobility during ADLs: Minimal assistance;+2 for safety/equipment;Rolling walker General ADL Comments: Pt is very reluctant to move due to pain.  Requires max coaxing and encouragement  Vision         Perception     Praxis      Pertinent Vitals/Pain Pain Assessment: Faces Faces Pain Scale: Hurts even more Pain Location: neck, shoulders Rt rib cage, Lt wrist  Pain Descriptors / Indicators:  Aching;Guarding;Grimacing Pain Intervention(s): Repositioned;Heat applied;Monitored during session;Limited activity within patient's tolerance;Relaxation;Utilized relaxation techniques     Hand Dominance Right   Extremity/Trunk Assessment Upper Extremity Assessment Upper Extremity Assessment: RUE deficits/detail;LUE deficits/detail;Generalized weakness RUE Deficits / Details: Pt complaint of pain Rt shoulder.  AAROM of shoulder limited to ~70*.  Pt is very guarded with movement  RUE Coordination: decreased gross motor LUE Deficits / Details: Pt with Lt wrist splint in place.  He initially states he can't move his Lt UE, but will achieve ~70* AAROM Lt shoulder with max encouragement.  He is very guarded with movement.  significant tightness noted musculature bil. shoulders, scapulas, and neck    Lower Extremity Assessment Lower Extremity Assessment: Defer to PT evaluation   Cervical / Trunk Assessment Cervical / Trunk Assessment: Kyphotic;Other exceptions Cervical / Trunk Exceptions: Pt with significant gaurding due to pain.  Very little trunk movement with activity.  Maintains flexed posture.     Communication Communication Communication: No difficulties   Cognition Arousal/Alertness: Awake/alert Behavior During Therapy: Anxious Overall Cognitive Status: Within Functional Limits for tasks assessed                                     General Comments  towel roll placed under pt's neck to improve positioning and reduce pain.  Heat packs applied to neck and bil. shoulders     Exercises Exercises: General Lower Extremity;General Upper Extremity;Other exercises General Exercises - Upper Extremity Shoulder Flexion: AAROM;Right;Left;10 reps;Supine;Seated Other Exercises Other Exercises: encouraged neck rotation, flex/ext, and lateral flexion - pt only performed 1-4 reps each.    Shoulder Instructions      Home Living Family/patient expects to be discharged to:: (P)  Skilled nursing facility Living Arrangements: (P) Children Available Help at Discharge: (P) Family;Available 24 hours/day Type of Home: (P) Apartment Home Access: (P) Level entry     Home Layout: (P) One level     Bathroom Shower/Tub: (P) Tub/shower unit   Bathroom Toilet: (P) Standard     Home Equipment: (P) Walker - 2 wheels;Cane - single point;Shower seat;Bedside commode;Wheelchair - manual   Additional Comments: (P) pt lives with his son      Prior Functioning/Environment Level of Independence: (P) Needs assistance  Gait / Transfers Assistance Needed: with Rw or cane     Comments: Pt reports that he has required increased assistance the past few days due to Lt wrist pain        OT Problem List: Decreased strength;Decreased range of motion;Decreased activity tolerance;Impaired balance (sitting and/or standing);Decreased coordination;Decreased knowledge of use of DME or AE;Impaired UE functional use;Pain      OT Treatment/Interventions: Self-care/ADL training;Therapeutic exercise;DME and/or AE instruction;Therapeutic activities;Patient/family education;Balance training    OT Goals(Current goals can be found in the care plan section) Acute Rehab OT Goals Patient Stated Goal: To reduce pain  OT Goal Formulation: With patient Time For Goal Achievement: 11/29/16 Potential to Achieve Goals: Good ADL Goals Pt Will Perform Grooming: with min assist;sitting Pt Will Perform Upper Body Bathing: with min assist;sitting Pt Will Transfer to Toilet: with min assist;ambulating;regular height toilet;bedside commode;grab bars Pt Will Perform Toileting - Clothing Manipulation and hygiene:  with min assist;sit to/from stand Pt/caregiver will Perform Home Exercise Program: Left upper extremity;Right Upper extremity;Increased ROM;With minimal assist Additional ADL Goal #1: Pt will complete ADL tasks with pain no greater than 4/10  OT Frequency: Min 2X/week   Barriers to D/C: Decreased  caregiver support          Co-evaluation PT/OT/SLP Co-Evaluation/Treatment: Yes Reason for Co-Treatment: (P) Complexity of the patient's impairments (multi-system involvement);For patient/therapist safety PT goals addressed during session: (P) Mobility/safety with mobility OT goals addressed during session: ADL's and self-care;Strengthening/ROM      AM-PAC PT "6 Clicks" Daily Activity     Outcome Measure Help from another person eating meals?: A Little Help from another person taking care of personal grooming?: A Little Help from another person toileting, which includes using toliet, bedpan, or urinal?: A Lot Help from another person bathing (including washing, rinsing, drying)?: A Lot Help from another person to put on and taking off regular upper body clothing?: A Lot Help from another person to put on and taking off regular lower body clothing?: Total 6 Click Score: 13   End of Session Equipment Utilized During Treatment: Rolling walker Nurse Communication: Mobility status  Activity Tolerance: Patient limited by pain Patient left: in bed;with call Greis/phone within reach;with bed alarm set  OT Visit Diagnosis: Pain;Unsteadiness on feet (R26.81) Pain - Right/Left: Right Pain - part of body: Shoulder;Arm                Time: 1330-1408 OT Time Calculation (min): 38 min Charges:  OT General Charges $OT Visit: 1 Procedure OT Evaluation $OT Eval Moderate Complexity: 1 Procedure G-Codes:     Lucille Passy, OTR/L [phone]   Lucille Passy M 11/15/2016, 3:33 PM

## 2016-11-15 NOTE — Evaluation (Signed)
Physical Therapy Evaluation Patient Details Name: Juan [name] MRN: [id number] DOB: 1940/09/15 Today's Date: 11/15/2016   History of Present Illness  Juan [name] is a 76 y.o. male with medical history significant for but not limited to myeloproliferative disorder presenting with several days history of progressively worsening severe left wrist aching pain radiating to ipsilateral lower forearm associated with swelling of the left wrist, unable to touch it, not relieved by home remedy. Patient was admitted for acute CHF and left upper extremity pain; likely acute gouty arthritis; working diagnosis of pneumonia as well  Clinical Impression  Pt presents with the above diagnosis and below deficits for therapy evaluation. Prior to admission, pt lived with his son in a single level home. Pt requires Max A for all mobility this session and is significantly limited by pain in left cervical and shoulder region. Pt will benefit from continued acute PT services in order to address the below deficits and will require SNF at discharge to maximize his functional outcomes.     Follow Up Recommendations SNF;Supervision/Assistance - 24 hour    Equipment Recommendations  None recommended by PT    Recommendations for Other Services       Precautions / Restrictions Precautions Precautions: Fall Restrictions Weight Bearing Restrictions: No      Mobility  Bed Mobility Overal bed mobility: Needs Assistance Bed Mobility: Supine to Sit;Sit to Supine     Supine to sit: Max assist;+2 for physical assistance Sit to supine: Max assist   General bed mobility comments: Pt requires max coaxing and encouragement to move to EOB.  He requires assist with all aspects to prevent increase in pain     Transfers Overall transfer level: Needs assistance Equipment used: Rolling walker (2 wheeled) Transfers: Sit to/from Stand Sit to Stand: Mod assist;+2 physical assistance         General transfer comment: Pt requires  assistance to power up to standing and MIn A +2 to side step up in bed  Ambulation/Gait             General Gait Details: unable to assess this session  Stairs            Wheelchair Mobility    Modified Rankin (Stroke Patients Only)       Balance Overall balance assessment: Needs assistance Sitting-balance support: Feet supported;No upper extremity supported Sitting balance-Leahy Scale: Poor Sitting balance - Comments: Pt requires min - mod A EOB.  He demonstrates posterior lean due to pain  Postural control: Posterior lean Standing balance support: Bilateral upper extremity supported Standing balance-Leahy Scale: Poor Standing balance comment: requires min A and bil UE support                              Pertinent Vitals/Pain Pain Assessment: Faces Faces Pain Scale: Hurts even more Pain Location: neck, shoulders Rt rib cage, Lt wrist  Pain Descriptors / Indicators: Aching;Guarding;Grimacing Pain Intervention(s): Repositioned;Utilized relaxation techniques;Heat applied;Monitored during session    Home Living Family/patient expects to be discharged to:: Skilled nursing facility Living Arrangements: Children Available Help at Discharge: Family;Available 24 hours/day Type of Home: Apartment Home Access: Level entry     Home Layout: One level Home Equipment: Walker - 2 wheels;Cane - single point;Shower seat;Bedside commode;Wheelchair - manual Additional Comments: pt lives with his son    Prior Function Level of Independence: Needs assistance   Gait / Transfers Assistance Needed: with Rw or cane  Comments: Pt reports that he has required increased assistance the past few days due to Lt wrist pain     Hand Dominance   Dominant Hand: Right    Extremity/Trunk Assessment   Upper Extremity Assessment Upper Extremity Assessment: Defer to OT evaluation RUE Deficits / Details: Pt complaint of pain Rt shoulder.  AAROM of shoulder limited to  ~70*.  Pt is very guarded with movement  RUE Coordination: decreased gross motor LUE Deficits / Details: Pt with Lt wrist splint in place.  He initially states he can't move his Lt UE, but will achieve ~70* AAROM Lt shoulder with max encouragement.  He is very guarded with movement.  significant tightness noted musculature bil. shoulders, scapulas, and neck     Lower Extremity Assessment Lower Extremity Assessment: Generalized weakness    Cervical / Trunk Assessment Cervical / Trunk Assessment: Kyphotic;Other exceptions Cervical / Trunk Exceptions: Pt with significant gaurding due to pain.  Very little trunk movement with activity.  Maintains flexed posture.    Communication   Communication: No difficulties  Cognition Arousal/Alertness: Awake/alert Behavior During Therapy: Anxious Overall Cognitive Status: Within Functional Limits for tasks assessed                                        General Comments General comments (skin integrity, edema, etc.): towel roll placed under pt's neck to improve positioning and reduce pain.  Heat packs applied to neck and bil. shoulders     Exercises General Exercises - Upper Extremity Shoulder Flexion: AAROM;Right;Left;10 reps;Supine;Seated Other Exercises Other Exercises: encouraged neck rotation, flex/ext, and lateral flexion - pt only performed 1-4 reps each.    Assessment/Plan    PT Assessment Patient needs continued PT services  PT Problem List Decreased strength;Decreased activity tolerance;Decreased balance;Decreased mobility;Decreased knowledge of use of DME;Pain       PT Treatment Interventions DME instruction;Gait training;Stair training;Functional mobility training;Therapeutic activities;Therapeutic exercise;Balance training;Patient/family education    PT Goals (Current goals can be found in the Care Plan section)  Acute Rehab PT Goals Patient Stated Goal: To reduce pain  PT Goal Formulation: With patient Time For  Goal Achievement: 11/22/16 Potential to Achieve Goals: Fair    Frequency Min 2X/week   Barriers to discharge        Co-evaluation PT/OT/SLP Co-Evaluation/Treatment: Yes Reason for Co-Treatment: Complexity of the patient's impairments (multi-system involvement);For patient/therapist safety PT goals addressed during session: Mobility/safety with mobility OT goals addressed during session: ADL's and self-care;Strengthening/ROM       AM-PAC PT "6 Clicks" Daily Activity  Outcome Measure Difficulty turning over in bed (including adjusting bedclothes, sheets and blankets)?: Total Difficulty moving from lying on back to sitting on the side of the bed? : Total Difficulty sitting down on and standing up from a chair with arms (e.g., wheelchair, bedside commode, etc,.)?: Total Help needed moving to and from a bed to chair (including a wheelchair)?: Total Help needed walking in hospital room?: Total Help needed climbing 3-5 steps with a railing? : Total 6 Click Score: 6    End of Session Equipment Utilized During Treatment: Gait belt;Oxygen Activity Tolerance: Patient limited by pain Patient left: in bed;with call Ferrero/phone within reach;with bed alarm set Nurse Communication: Mobility status PT Visit Diagnosis: Unsteadiness on feet (R26.81);Muscle weakness (generalized) (M62.81);Pain Pain - Right/Left: Left Pain - part of body: Arm;Hand;Shoulder    Time: 6761-9509 PT Time Calculation (min) (ACUTE ONLY): 30  min   Charges:   PT Evaluation $PT Eval Moderate Complexity: 1 Procedure     PT G Codes:        Scheryl Marten PT, DPT  [phone]   Shanon Rosser 11/15/2016, 3:36 PM

## 2016-11-15 NOTE — Progress Notes (Signed)
PROGRESS NOTE  Juan [name]  YTK:[id number] DOB: 06/14/1940 DOA: 11/14/2016 PCP: Colon Branch, MD  Brief Narrative:   The patient is a 76 year old male with history of hypertension, diet-controlled diabetes, chronic respiratory failure on 2 L oxygen, OSA not on CPAP, gout, myeloproliferative disease, diastolic heart failure, CAD, ascending aortic aneurysm, anemia, CKD-3 who presented with neck pain and fever. The patient was hospitalized from 6/16-6/20 with acute gout and pneumonia. He was discharged in stable condition. He was unable to afford the $9 prescription for Augmentin and therefore did not continue his antibiotics at home.  He was doing well into until 2 days prior to admission when he developed fever and neck pain. He had a temperature of 100 Fahrenheit at home. His pain was located in the left side of his neck, 8 out of 10 in severity. In the emergency department, he had a positive rapid strep test. His white blood cell count was 12.8, and he had a temperature of 100.5.  CT of the neck demonstrated no evidence of abscess or inflammatory process.     Assessment & Plan:   Principal Problem:   Neck pain Active Problems:   Essential hypertension   Coronary atherosclerosis   Chronic diastolic (congestive) heart failure (HCC)   CKD (chronic kidney disease) stage 3, GFR 30-59 ml/min   Agnogenic myeloid metaplasia (HCC)   Anemia   Gout   Fever   Elevated troponin  Neck pain: improving.  Suspect muscle spasm - OT evaluation pending - change to voltaren gel  -continue home Norco prn and tylenol -continue Robaxin  Fever, leukocytosis with WBC 12.8. Urinalysis is negative. Chest x-rays negative for infiltration.  Recent pneumonia that was not fully treated and postiive rapid strep test -continue IV rocephin - blood cultures pending -prn tylenol for fever - Patient states he gets pain in the first of the month and should be able to afford his prescription on Tuesday  HTN:    -continue Coreg, hydralazine -prn IV hydralazine -Resume torsemide and metolazone  Coronary atherosclerosis and positive Trop: Patient troponin is 0.03, but denies any chest pain. Likely due to demand ischemia secondary to fever. - cycle CE q6 x3 and repeat EKG in the am  -continue aspirin and imdur - Risk factor stratification: will check FLP (A1C was 5.8 on 07/28/16)  CKD-III: Stable. Baseline creatinine 1.2-1.5. His creatinine is 1.13, but BUN 68, indicating volume depletion. -received 500 cc NS in ED -f/u renal function by BMP  Agnogenic myeloid metaplasia: per previous discharge summary. "Has seen Dr. Marin Olp in the past (consult note 05/07/16 reviewed). Apparently he has refused a bone marrow biopsy and does not want any treatment for this".  Anemia: Hemoglobin stable. 9.2 on 11/05/14--> 9.4 today. -Continue iron supplement -Follow-up CBC  Gout: -continue prn colchicine  Chronic diastolic congestive heart failure: 2-D echo on 10/14/16 showed EF 50-60 percent with grade 1 diastolic dysfunction. Patient does not have leg edema JVD. CHF is compensated. Holding home metolazone and torsemide for today -Continue aspirin, Coreg - BNP 186.6  DVT prophylaxis:  lovenox Code Status:  Full code Family Communication:  Patient alone Disposition Plan:  To home tomorrow. Awaiting blood cultures. OT/PT assessments pending.  Will need case management consult prior to discharge to make sure patient can afford would have her antibiotics are prescribed.   Consultants:   None  Procedures:   CT of neck soft tissue showed 1. No abscess or inflammatory process of the neck identified. 2. Diffuse bony stigmata of  myeloproliferative disease and/or metabolic bone disease. 3. Mosaic attenuation of lung parenchyma may represent small airways or small vessel disease. 4. 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  Antimicrobials:  Anti-infectives    Start      Dose/Rate Route Frequency Ordered Stop   11/15/16 0200  cefTRIAXone (ROCEPHIN) 2 g in dextrose 5 % 50 mL IVPB     2 g 100 mL/hr over 30 Minutes Intravenous Every 24 hours 11/14/16 0449     11/14/16 0345  cefTRIAXone (ROCEPHIN) 2 g in dextrose 5 % 50 mL IVPB     2 g 100 mL/hr over 30 Minutes Intravenous  Once 11/14/16 0332 11/14/16 0411       Subjective:  Still having severe left-sided neck pain but somewhat improved and able to turn head to midline but not to the left. Worsening right back pain.  Eating well.  Denies blood in stools, nose bleeds, unusual bruising.    Objective: Vitals:   11/14/16 2112 11/14/16 2121 11/15/16 0409 11/15/16 0958  BP: (!) 154/83  (!) 145/72 133/67  Pulse: (!) 113 95 95   Resp: 18  18   Temp:  98.6 F (37 C) 98.5 F (36.9 C)   TempSrc:  Oral Oral   SpO2: 94% 95% 90%   Weight:      Height:        Intake/Output Summary (Last 24 hours) at 11/15/16 1324 Last data filed at 11/14/16 1822  Gross per 24 hour  Intake                0 ml  Output              400 ml  Net             -400 ml   Filed Weights   11/14/16 0640  Weight: 85.7 kg (189 lb)    Examination:  General exam:  Adult Male, lying in bed with his head turned to the right side, head midline.   HEENT:  Normocephalic atraumatic Respiratory system: Clear to auscultation bilaterally Cardiovascular system: Regular rate and rhythm, no murmurs, warm extremities Gastrointestinal system: NABS, soft, nondistended, nontender MSK:  Normal tone and bulk, trace bilateral lower extremity edema.  Persistent TTP over left trapezius muscle.   TTP over the right rhomboid area has resolved. Now has spasms along the right thoracic paraspinous muscles.  No overlying rash, no fluctuance, induration or masses.  Has pain when attempting passive and active turning the head to the left Neuro:  Strength 5 minus out of 5 all extremities     Data Reviewed: I have personally reviewed following labs and imaging  studies  CBC:  Recent Labs Lab 11/14/16 0154 11/14/16 0504 11/15/16 0533  WBC 12.8* 12.7* 10.8*  NEUTROABS 7.3  --   --   HGB 9.4* 10.7* 8.5*  HCT 30.5* 35.5* 27.4*  MCV 82.0 83.1 82.5  PLT 432* 401* 502   Basic Metabolic Panel:  Recent Labs Lab 11/14/16 0154 11/14/16 0504 11/15/16 0533  NA 139 139 136  K 3.6 3.9 4.0  CL 103 103 101  CO2 '27 24 26  '$ GLUCOSE 122* 121* 107*  BUN 65* 64* 56*  CREATININE 1.13 1.14 1.22  CALCIUM 9.4 9.3 9.2   GFR: Estimated Creatinine Clearance: 63.5 mL/min (by C-G formula based on SCr of 1.22 mg/dL). Liver Function Tests:  Recent Labs Lab 11/14/16 0154  AST 25  ALT 13*  ALKPHOS 105  BILITOT 0.5  PROT 5.9*  ALBUMIN 3.0*   No results for input(s): LIPASE, AMYLASE in the last 168 hours. No results for input(s): AMMONIA in the last 168 hours. Coagulation Profile:  Recent Labs Lab 11/14/16 0158  INR 1.27   Cardiac Enzymes:  Recent Labs Lab 11/14/16 0158 11/14/16 0504 11/14/16 1005 11/14/16 1607  TROPONINI 0.03* 0.03* 0.03* 0.03*   BNP (last 3 results) No results for input(s): PROBNP in the last 8760 hours. HbA1C: No results for input(s): HGBA1C in the last 72 hours. CBG:  Recent Labs Lab 11/14/16 0817  GLUCAP 108*   Lipid Profile:  Recent Labs  11/14/16 0504  CHOL 176  HDL 59  LDLCALC 106*  TRIG 56  CHOLHDL 3.0   Thyroid Function Tests: No results for input(s): TSH, T4TOTAL, FREET4, T3FREE, THYROIDAB in the last 72 hours. Anemia Panel: No results for input(s): VITAMINB12, FOLATE, FERRITIN, TIBC, IRON, RETICCTPCT in the last 72 hours. Urine analysis:    Component Value Date/Time   COLORURINE YELLOW 11/14/2016 0345   APPEARANCEUR CLEAR 11/14/2016 0345   LABSPEC 1.010 11/14/2016 0345   PHURINE 5.0 11/14/2016 0345   GLUCOSEU NEGATIVE 11/14/2016 0345   GLUCOSEU NEGATIVE 05/02/2015 1008   HGBUR NEGATIVE 11/14/2016 0345   BILIRUBINUR NEGATIVE 11/14/2016 0345   KETONESUR NEGATIVE 11/14/2016 0345    PROTEINUR 100 (A) 11/14/2016 0345   UROBILINOGEN 0.2 05/02/2015 1008   NITRITE NEGATIVE 11/14/2016 0345   LEUKOCYTESUR NEGATIVE 11/14/2016 0345   Sepsis Labs: '@LABRCNTIP'$ (procalcitonin:4,lacticidven:4)  ) Recent Results (from the past 240 hour(s))  Rapid strep screen     Status: Abnormal   Collection Time: 11/14/16  1:59 AM  Result Value Ref Range Status   Streptococcus, Group A Screen (Direct) POSITIVE (A) NEGATIVE Final  Urine Culture     Status: Abnormal   Collection Time: 11/14/16  3:45 AM  Result Value Ref Range Status   Specimen Description URINE, RANDOM  Final   Special Requests NONE  Final   Culture <10,000 COLONIES/mL INSIGNIFICANT GROWTH (A)  Final   Report Status 11/15/2016 FINAL  Final  MRSA PCR Screening     Status: None   Collection Time: 11/14/16 12:28 PM  Result Value Ref Range Status   MRSA by PCR NEGATIVE NEGATIVE Final    Comment:        The GeneXpert MRSA Assay (FDA approved for NASAL specimens only), is one component of a comprehensive MRSA colonization surveillance program. It is not intended to diagnose MRSA infection nor to guide or monitor treatment for MRSA infections.       Radiology Studies: Dg Chest 2 View  Result Date: 11/14/2016 CLINICAL DATA:  76 y/o  M; fever. EXAM: CHEST  2 VIEW COMPARISON:  None. FINDINGS: Stable enlarged cardiac silhouette. Aortic atherosclerosis with calcification. Stable diffuse interstitial prominence probably representing edema. No pleural effusion. Dextrocurvature of thoracic spine. IMPRESSION: Stable diffuse interstitial prominence probably representing edema. Enlarged cardiac silhouette. Electronically Signed   By: Kristine Garbe M.D.   On: 11/14/2016 02:52   Ct Soft Tissue Neck W Contrast  Result Date: 11/14/2016 CLINICAL DATA:  76 y/o M; neck pain and fever since last night. History of myeloproliferative disease. EXAM: CT NECK WITH CONTRAST TECHNIQUE: Multidetector CT imaging of the neck was performed  using the standard protocol following the bolus administration of intravenous contrast. CONTRAST:  75 cc Isovue-300 COMPARISON:  10/09/2016 cervical MRI FINDINGS: Pharynx and larynx: Normal. No mass or swelling. Salivary glands: No inflammation, mass, or stone. Thyroid: Normal. Lymph nodes: None enlarged or abnormal density. Vascular: Negative.  Limited intracranial: Negative. Visualized orbits: Negative. Mastoids and visualized paranasal sinuses: Clear. Skeleton: Diffuse sclerosis of the bones compatible with sequelae of myeloproliferative disease and/or metabolic bone disease. Cervical spondylosis greatest at the C5 through C7 levels. No acute osseous abnormality is evident. Upper chest: Mosaic attenuation of lung parenchyma compatible small airways or small vessel disease. 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 Other: None. IMPRESSION: 1. No abscess or inflammatory process of the neck identified. 2. Diffuse bony stigmata of myeloproliferative disease and/or metabolic bone disease. 3. Mosaic attenuation of lung parenchyma may represent small airways or small vessel disease. 4. 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 Electronically Signed   By: Kristine Garbe M.D.   On: 11/14/2016 03:29     Scheduled Meds: . aspirin  325 mg Oral Daily  . B-complex with vitamin C  1 tablet Oral Daily  . carvedilol  3.125 mg Oral BID WC  . enoxaparin (LOVENOX) injection  40 mg Subcutaneous Q24H  . feeding supplement (GLUCERNA SHAKE)  118.5 mL Oral Daily  . ferrous gluconate  324 mg Oral Q2200  . hydrALAZINE  25 mg Oral TID  . isosorbide mononitrate  30 mg Oral BID  . methocarbamol  750 mg Oral TID  . sodium chloride flush  3 mL Intravenous Q12H  . torsemide  80 mg Oral Daily   Continuous Infusions: . cefTRIAXone (ROCEPHIN)  IV Stopped (11/15/16 0245)     LOS: 1 day    Time spent: 30 min    Janece Canterbury, MD [facility] Pager [phone]  If 7PM-7AM, please contact night-coverage [email or web address] Password [email or web address] 11/15/2016, 1:24 PM

## 2016-11-16 ENCOUNTER — Telehealth: Payer: Self-pay | Admitting: Internal Medicine

## 2016-11-16 DIAGNOSIS — R2681 Unsteadiness on feet: Secondary | ICD-10-CM | POA: Diagnosis not present

## 2016-11-16 DIAGNOSIS — D508 Other iron deficiency anemias: Secondary | ICD-10-CM

## 2016-11-16 DIAGNOSIS — I5040 Unspecified combined systolic (congestive) and diastolic (congestive) heart failure: Secondary | ICD-10-CM | POA: Diagnosis not present

## 2016-11-16 DIAGNOSIS — R7989 Other specified abnormal findings of blood chemistry: Secondary | ICD-10-CM | POA: Diagnosis not present

## 2016-11-16 DIAGNOSIS — R262 Difficulty in walking, not elsewhere classified: Secondary | ICD-10-CM | POA: Diagnosis not present

## 2016-11-16 DIAGNOSIS — E119 Type 2 diabetes mellitus without complications: Secondary | ICD-10-CM | POA: Diagnosis not present

## 2016-11-16 DIAGNOSIS — M109 Gout, unspecified: Secondary | ICD-10-CM | POA: Diagnosis not present

## 2016-11-16 DIAGNOSIS — R799 Abnormal finding of blood chemistry, unspecified: Secondary | ICD-10-CM | POA: Diagnosis not present

## 2016-11-16 DIAGNOSIS — M25551 Pain in right hip: Secondary | ICD-10-CM | POA: Diagnosis not present

## 2016-11-16 DIAGNOSIS — E785 Hyperlipidemia, unspecified: Secondary | ICD-10-CM | POA: Diagnosis not present

## 2016-11-16 DIAGNOSIS — G4733 Obstructive sleep apnea (adult) (pediatric): Secondary | ICD-10-CM | POA: Diagnosis not present

## 2016-11-16 DIAGNOSIS — D649 Anemia, unspecified: Secondary | ICD-10-CM | POA: Diagnosis not present

## 2016-11-16 DIAGNOSIS — M6281 Muscle weakness (generalized): Secondary | ICD-10-CM | POA: Diagnosis not present

## 2016-11-16 DIAGNOSIS — D7581 Myelofibrosis: Secondary | ICD-10-CM | POA: Diagnosis not present

## 2016-11-16 DIAGNOSIS — N183 Chronic kidney disease, stage 3 (moderate): Secondary | ICD-10-CM | POA: Diagnosis not present

## 2016-11-16 DIAGNOSIS — M542 Cervicalgia: Secondary | ICD-10-CM | POA: Diagnosis not present

## 2016-11-16 DIAGNOSIS — J020 Streptococcal pharyngitis: Secondary | ICD-10-CM | POA: Diagnosis not present

## 2016-11-16 DIAGNOSIS — R509 Fever, unspecified: Secondary | ICD-10-CM | POA: Diagnosis not present

## 2016-11-16 DIAGNOSIS — J9610 Chronic respiratory failure, unspecified whether with hypoxia or hypercapnia: Secondary | ICD-10-CM | POA: Diagnosis not present

## 2016-11-16 DIAGNOSIS — I10 Essential (primary) hypertension: Secondary | ICD-10-CM | POA: Diagnosis not present

## 2016-11-16 DIAGNOSIS — I5032 Chronic diastolic (congestive) heart failure: Secondary | ICD-10-CM | POA: Diagnosis not present

## 2016-11-16 DIAGNOSIS — S199XXA Unspecified injury of neck, initial encounter: Secondary | ICD-10-CM | POA: Diagnosis not present

## 2016-11-16 DIAGNOSIS — R41841 Cognitive communication deficit: Secondary | ICD-10-CM | POA: Diagnosis not present

## 2016-11-16 DIAGNOSIS — I25119 Atherosclerotic heart disease of native coronary artery with unspecified angina pectoris: Secondary | ICD-10-CM | POA: Diagnosis not present

## 2016-11-16 DIAGNOSIS — M62838 Other muscle spasm: Secondary | ICD-10-CM | POA: Diagnosis not present

## 2016-11-16 LAB — CBC
HCT: 27.3 % — ABNORMAL LOW (ref 39.0–52.0)
HEMOGLOBIN: 8.2 g/dL — AB (ref 13.0–17.0)
MCH: 24.6 pg — AB (ref 26.0–34.0)
MCHC: 30 g/dL (ref 30.0–36.0)
MCV: 82 fL (ref 78.0–100.0)
Platelets: 355 10*3/uL (ref 150–400)
RBC: 3.33 MIL/uL — AB (ref 4.22–5.81)
RDW: 18.4 % — ABNORMAL HIGH (ref 11.5–15.5)
WBC: 9.8 10*3/uL (ref 4.0–10.5)

## 2016-11-16 LAB — BASIC METABOLIC PANEL
ANION GAP: 9 (ref 5–15)
BUN: 49 mg/dL — ABNORMAL HIGH (ref 6–20)
CALCIUM: 9 mg/dL (ref 8.9–10.3)
CHLORIDE: 103 mmol/L (ref 101–111)
CO2: 26 mmol/L (ref 22–32)
Creatinine, Ser: 1.09 mg/dL (ref 0.61–1.24)
GFR calc non Af Amer: >60 mL/min (ref >60)
Glucose, Bld: 108 mg/dL — ABNORMAL HIGH (ref 65–99)
Potassium: 3.9 mmol/L (ref 3.5–5.1)
SODIUM: 138 mmol/L (ref 135–145)

## 2016-11-16 LAB — GLUCOSE, CAPILLARY: GLUCOSE-CAPILLARY: 100 mg/dL — AB (ref 65–99)

## 2016-11-16 MED ORDER — ACETAMINOPHEN 325 MG PO TABS: 650.0000 mg | ORAL_TABLET | Freq: Four times a day (QID) | ORAL | Status: DC | PRN

## 2016-11-16 MED ORDER — FERROUS GLUCONATE 324 (38 FE) MG PO TABS: 324.0000 mg | ORAL_TABLET | Freq: Three times a day (TID) | ORAL | 0 refills | Status: DC

## 2016-11-16 MED ORDER — HYDROCODONE-ACETAMINOPHEN 5-325 MG PO TABS: 1.0000 | ORAL_TABLET | Freq: Four times a day (QID) | ORAL | 0 refills | Status: DC | PRN

## 2016-11-16 MED ORDER — ATORVASTATIN CALCIUM 40 MG PO TABS: 40.0000 mg | ORAL_TABLET | Freq: Every day | ORAL | 0 refills | Status: DC

## 2016-11-16 MED ORDER — AMOXICILLIN-POT CLAVULANATE 500-125 MG PO TABS
500.0000 mg | ORAL_TABLET | Freq: Two times a day (BID) | ORAL | 0 refills | Status: DC
Start: 2016-11-16 — End: 2016-12-24

## 2016-11-16 MED ORDER — PHENYLEPHRINE HCL 0.25 % NA SOLN
2.0000 | Freq: Four times a day (QID) | NASAL | Status: DC | PRN
  Administered 2016-11-16 (×2): 2 via NASAL
  Filled 2016-11-16: qty 15

## 2016-11-16 NOTE — Telephone Encounter (Signed)
Please advise 

## 2016-11-16 NOTE — Telephone Encounter (Signed)
LMOM requesting call back

## 2016-11-16 NOTE — Progress Notes (Signed)
Pt.had nosebleeding.v/s stable.MD on call  Schorr made aware & ordered Neosynephrine spray,Will continue monitor pt.

## 2016-11-16 NOTE — Progress Notes (Signed)
Occupational Therapy Treatment Patient Details Name: Juan [name] MRN: [id number] DOB: 1940-06-09 Today's Date: 11/16/2016    History of present illness Juan [name] is a 76 y.o. male with medical history significant for but not limited to myeloproliferative disorder presenting with several days history of progressively worsening severe left wrist aching pain radiating to ipsilateral lower forearm associated with swelling of the left wrist, unable to touch it, not relieved by home remedy. Patient was admitted for acute CHF and left upper extremity pain; likely acute gouty arthritis; working diagnosis of pneumonia as well   OT comments  Pt moving better today.  He was able to sit EOB without assist to feed himself.  He continues to requires mod - max A for bed mobility, and mod A +2 for standing and side stepping.  He insists he doesn't have adequate help at discharge and is fearful of returning home.  Pain limits his participation significantly and he appears to self limit at times.  Will continue to follow.     Follow Up Recommendations  SNF;Home health OT    Equipment Recommendations  3 in 1 bedside commode;Wheelchair (measurements OT)    Recommendations for Other Services      Precautions / Restrictions Precautions Precautions: Fall       Mobility Bed Mobility Overal bed mobility: Needs Assistance Bed Mobility: Supine to Sit;Sit to Supine     Supine to sit: Max assist Sit to supine: Max assist   General bed mobility comments: Pt requires assist to move LEs off bed and to lift trunk.  He assists more with returning to supine, but needs assist to lift LEs and to maneuver trunk into bed   Transfers Overall transfer level: Needs assistance Equipment used: Rolling walker (2 wheeled) Transfers: Sit to/from Stand Sit to Stand: Mod assist;+2 physical assistance         General transfer comment: Attempted to move to standing several times, but pt with poor effort insisting he can't  stand due to pain and needing second person to assist. Encouragement provided.  Bed height elevatd and he was then able to stand with mod A +2 and side step up EOB     Balance Overall balance assessment: Needs assistance Sitting-balance support: Feet supported;No upper extremity supported Sitting balance-Leahy Scale: Fair Sitting balance - Comments: Pt able to sit EOB x 15 mins to eat lunch    Standing balance support: Bilateral upper extremity supported Standing balance-Leahy Scale: Poor Standing balance comment: requires min A and bil. UE support for short periods of standing                            ADL either performed or assessed with clinical judgement   ADL Overall ADL's : Needs assistance/impaired Eating/Feeding: Set up;Sitting Eating/Feeding Details (indicate cue type and reason): Pt fed self while sitting EOB after set up provided                      Toilet Transfer: Moderate assistance;+2 for physical assistance;Stand-pivot;BSC Toilet Transfer Details (indicate cue type and reason): requires assist to power up into standing  Toileting- Clothing Manipulation and Hygiene: Total assistance;Sit to/from stand       Functional mobility during ADLs: Moderate assistance;+2 for physical assistance;Rolling walker General ADL Comments: Pt very upset this session due to not meeting criteria for SNF level rehab.  He states he has no one who can assist him at home.  He  requires mod cues/encouragement to fully participate.  He cites pain as limiting his abilities      Manufacturing systems engineer      Cognition Arousal/Alertness: Awake/alert Behavior During Therapy: Anxious Overall Cognitive Status: Within Functional Limits for tasks assessed                                          Exercises     Shoulder Instructions       General Comments      Pertinent Vitals/ Pain       Pain Assessment: Faces Faces Pain Scale: Hurts  even more Pain Location: neck, shoulders Rt rib cage, Lt wrist  Pain Descriptors / Indicators: Aching;Guarding Pain Intervention(s): Repositioned;Limited activity within patient's tolerance;Monitored during session  Home Living                                          Prior Functioning/Environment              Frequency  Min 2X/week        Progress Toward Goals  OT Goals(current goals can now be found in the care plan section)  Progress towards OT goals: Progressing toward goals     Plan Discharge plan needs to be updated    Co-evaluation                 AM-PAC PT "6 Clicks" Daily Activity     Outcome Measure   Help from another person eating meals?: A Little Help from another person taking care of personal grooming?: A Little Help from another person toileting, which includes using toliet, bedpan, or urinal?: A Lot Help from another person bathing (including washing, rinsing, drying)?: A Lot Help from another person to put on and taking off regular upper body clothing?: A Lot Help from another person to put on and taking off regular lower body clothing?: Total 6 Click Score: 13    End of Session Equipment Utilized During Treatment: Gait belt;Rolling walker  OT Visit Diagnosis: Pain;Unsteadiness on feet (R26.81) Pain - Right/Left: Right Pain - part of body: Shoulder;Arm   Activity Tolerance Patient limited by pain   Patient Left in bed;with call Pickup/phone within reach;with bed alarm set   Nurse Communication Mobility status        Time: 3790-2409 OT Time Calculation (min): 32 min  Charges: OT General Charges $OT Visit: 1 Procedure OT Treatments $Self Care/Home Management : 8-22 mins $Therapeutic Activity: 8-22 mins  [facility], OTR/L [phone]    Lucille Passy M 11/16/2016, 4:51 PM

## 2016-11-16 NOTE — Clinical Social Work Note (Signed)
Clinical Social Work Assessment  Patient Details  Name: Juan [name] MRN: [id number] Date of Birth: 02/28/1941  Date of referral:  11/16/16               Reason for consult:  Facility Placement                Permission sought to share information with:  Facility Sport and exercise psychologist, Family Supports Permission granted to share information::  Yes, Verbal Permission Granted  Name::        Agency::  SNFs  Relationship::     Contact Information:     Housing/Transportation Living arrangements for the past 2 months:  Apartment Source of Information:  Patient Patient Interpreter Needed:  None Criminal Activity/Legal Involvement Pertinent to Current Situation/Hospitalization:  No - Comment as needed Significant Relationships:  Adult Children Lives with:  Adult Children Do you feel safe going back to the place where you live?  No Need for family participation in patient care:  No (Coment)  Care giving concerns:  CSW received consult for possible SNF placement at time of discharge. CSW spoke with patient regarding PT recommendation of SNF placement at time of discharge. Patient has qualifying stay from 6/17 to 6/20 and can go to SNF under his insurance. Patient reported that patient's son is currently unable to care for patient at their apartment given patient's current physical needs and fall risk. Patient expressed understanding of PT recommendation and is agreeable to SNF placement at time of discharge. CSW to continue to follow and assist with discharge planning needs.   Social Worker assessment / plan:  CSW spoke with patient concerning possibility of rehab at [facility] before returning home.  Employment status:  Retired Forensic scientist:  Medicare PT Recommendations:  [place] / Referral to community resources:  [place]  Patient/Family's Response to care:  Patient recognizes need for rehab before returning home and is agreeable to a SNF in  [place]. Patient reported preference for somewhere with a bus line so that his son can visit him.  Patient/Family's Understanding of and Emotional Response to Diagnosis, Current Treatment, and Prognosis:  Patient/family is realistic regarding therapy needs and expressed being hopeful for SNF placement. Patient expressed understanding of CSW role and discharge process as well as his medical condition. No questions/concerns about plan or treatment.    Emotional Assessment Appearance:  Appears stated age Attitude/Demeanor/Rapport:  Other (Appropriate) Affect (typically observed):  Accepting, Appropriate Orientation:  Oriented to Self, Oriented to Place, Oriented to  Time, Oriented to Situation Alcohol / Substance use:  Not Applicable Psych involvement (Current and /or in the community):  No (Comment)  Discharge Needs  Concerns to be addressed:  Care Coordination Readmission within the last 30 days:  Yes Current discharge risk:  None Barriers to Discharge:  No Barriers Identified   Benard Halsted, LCSWA 11/16/2016, 4:49 PM

## 2016-11-16 NOTE — Telephone Encounter (Signed)
Patient was discharged from the hospital today, they recommended SNF, that is were  he needs to go. If he refused SNF, then okay to set up PT OT at home

## 2016-11-16 NOTE — Progress Notes (Signed)
Patient will DC to: [facility] Anticipated DC date: 11/16/16 Family notified: Patient alerting son Transport by: Corey Harold- RN to contact [facility] when ready [phone])   Per MD patient ready for DC to [facility]. RN, patient, patient's family, and facility notified of DC. Discharge Summary sent to facility. RN given number for report [phone]). DC packet on chart.   CSW signing off.  Cedric Fishman, [place] Social Worker [phone]

## 2016-11-16 NOTE — Progress Notes (Signed)
Per MD, patient discharging today. Patient does not meet Medicare Placement Guidelines for SNF. CSW alerted RNCM for home health needs.  CSW signing off.  Neysa Hotter [phone]'

## 2016-11-16 NOTE — NC FL2 (Signed)
[place] LEVEL OF CARE SCREENING TOOL     IDENTIFICATION  Patient Name: Juan [name] Birthdate: [birth year] Sex: male Admission Date (Current Location): 11/14/2016  [facility] and [place] Number:  Herbalist and Address:  [facility], [place] [address], [place], [place] [address]      Provider Number: [id number]  Attending Physician Name and Address:  Janece Canterbury, MD  Relative Name and Phone Number:  [name], daughter, [phone]    Current Level of Care: Hospital Recommended Level of Care: [place] Prior Approval Number:    Date Approved/Denied:   PASRR Number:    Discharge Plan: SNF    Current Diagnoses: Patient Active Problem List   Diagnosis Date Noted  . Muscle spasm 11/16/2016  . Strep throat 11/16/2016  . Neck pain 11/14/2016  . Elevated troponin 11/14/2016  . Arthritis   . Recurrent pneumonia 10/31/2016  . Acute extremity pain 10/31/2016  . HCAP (healthcare-associated pneumonia) 10/31/2016  . 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 Pulmonary hypertension ([place])   . Sprain of left rotator cuff capsule   . Hyperkalemia   . Acute kidney injury superimposed on CKD ([place])   . Fever 10/08/2016  . Left arm pain 10/08/2016  . Paroxysmal SVT (supraventricular tachycardia) ([place]) 04/20/2016  . Type II diabetes mellitus ([place]) 04/20/2016  . Gout 04/20/2016  . Hyperuricemia 04/20/2016  . B12 deficiency 04/20/2016  . 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 Hydronephrosis   . Thrombocytosis ([place]) 04/07/2016  . Anemia 04/07/2016  . Community acquired pneumonia 04/01/2016  . Pneumonia 04/01/2016  . Pressure injury of skin 04/01/2016  . Pressure ulcer stage II 12/31/2015  . UTI (urinary tract infection) 12/30/2015  . Sepsis ([place]) 12/29/2015  . UTI (lower urinary tract infection) 12/29/2015  . T wave inversion in EKG 12/29/2015  . PCP NOTES >>>>>>>>>>>>>>>>> 05/02/2015  . Arm pain   . Peripheral  edema   . Unable to ambulate   . Thoracic aortic aneurysm ([place]) 04/26/2014  . CKD (chronic kidney disease) stage 3, GFR 30-59 ml/min 04/26/2014  . 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 Chest pain 04/15/2014  . Encounter for palliative care 04/06/2014  . Generalized weakness 04/06/2014  . Venous stasis dermatitis of both lower extremities   . Malnutrition of moderate degree ([place]) 03/31/2014  . Morbid obesity due to excess calories ([place]) 03/29/2014  . 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 Poor compliance with advise  05/05/2012  . Annual physical exam 01/14/2012  . Weight loss 01/14/2012  . BPH (benign prostatic hyperplasia) 01/14/2012  . *Chronic lower extremity edema, stasis dermatitis 06/09/2011  . CARDIOMEGALY 04/29/2009  . *Prediabetes 09/07/2006  . Symptomatic anemia 09/07/2006  . Myeloproliferative disease ([place]) 09/07/2006  . Obstructive sleep apnea 09/07/2006  . Essential hypertension 09/07/2006  . Coronary atherosclerosis 09/07/2006  . Chronic diastolic (congestive) heart failure ([place]) 09/07/2006  . Allergic rhinitis 09/07/2006    Orientation RESPIRATION BLADDER Height & Weight     Self, Time, Place, Situation  O2 (Nasal cannula 2L) Incontinent Weight: 186 lb 6.4 oz (84.6 kg) Height:  6\' 3"  (190.5 cm)  BEHAVIORAL SYMPTOMS/MOOD NEUROLOGICAL BOWEL NUTRITION STATUS      Incontinent Diet (Please see DC Summary)  AMBULATORY STATUS COMMUNICATION OF NEEDS Skin   Extensive Assist Verbally Normal                       Personal Care Assistance Level of Assistance  Bathing,  Feeding, Dressing Bathing Assistance: Maximum assistance Feeding assistance: Limited assistance Dressing Assistance: Limited assistance     Functional Limitations Info             SPECIAL CARE FACTORS FREQUENCY  PT (By licensed PT), OT (By licensed OT)     PT Frequency: 5x/week OT Frequency: 3x/week            Contractures      Additional Factors Info   Code Status, Allergies, Isolation Precautions Code Status Info: Full Allergies Info: NKA     Isolation Precautions Info: MRSA     Current Medications (11/16/2016):  This is the current hospital active medication list Current Facility-Administered Medications  Medication Dose Route Frequency Provider Last Rate Last Dose  . acetaminophen (TYLENOL) tablet 650 mg  650 mg Oral Q6H PRN Ivor Costa, MD      . albuterol (PROVENTIL) (2.5 MG/3ML) 0.083% nebulizer solution 2.5 mg  2.5 mg Nebulization Q4H PRN Ivor Costa, MD      . aspirin tablet 325 mg  325 mg Oral Daily Ivor Costa, MD   325 mg at 11/16/16 1116  . B-complex with vitamin C tablet 1 tablet  1 tablet Oral Daily Ivor Costa, MD   1 tablet at 11/16/16 1116  . bisacodyl (DULCOLAX) suppository 10 mg  10 mg Rectal Daily PRN Ivor Costa, MD      . carvedilol (COREG) tablet 3.125 mg  3.125 mg Oral BID WC Ivor Costa, MD   3.125 mg at 11/16/16 1119  . cefTRIAXone (ROCEPHIN) 2 g in dextrose 5 % 50 mL IVPB  2 g Intravenous Q24H Ivor Costa, MD   Stopped at 11/16/16 0241  . colchicine tablet 0.6 mg  0.6 mg Oral Daily PRN Ivor Costa, MD      . dextromethorphan-guaiFENesin [facility] DM) 30-600 MG per 12 hr tablet 1 tablet  1 tablet Oral BID PRN Ivor Costa, MD      . diclofenac sodium (VOLTAREN) 1 % transdermal gel 4 g  4 g Topical BID PRN Ivor Costa, MD   4 g at 11/15/16 1002  . enoxaparin (LOVENOX) injection 40 mg  40 mg Subcutaneous Q24H Ivor Costa, MD   40 mg at 11/16/16 1117  . feeding supplement (GLUCERNA SHAKE) (GLUCERNA SHAKE) liquid 118.5 mL  118.5 mL Oral Daily Ivor Costa, MD   118.5 mL at 11/16/16 1000  . ferrous gluconate (FERGON) tablet 324 mg  324 mg Oral Q2200 Ivor Costa, MD   324 mg at 11/15/16 2206  . gabapentin (NEURONTIN) capsule 200 mg  200 mg Oral TID PRN Ivor Costa, MD      . hydrALAZINE (APRESOLINE) injection 5 mg  5 mg Intravenous Q2H PRN Ivor Costa, MD      . hydrALAZINE (APRESOLINE) tablet 25 mg  25 mg Oral TID Ivor Costa, MD   25 mg  at 11/16/16 1119  . hydrocerin (EUCERIN) cream 1 application  1 application Topical Daily PRN Ivor Costa, MD      . HYDROcodone-acetaminophen (NORCO/VICODIN) 5-325 MG per tablet 1-2 tablet  1-2 tablet Oral Q6H PRN Ivor Costa, MD   1 tablet at 11/15/16 0432  . isosorbide mononitrate (IMDUR) 24 hr tablet 30 mg  30 mg Oral BID Ivor Costa, MD   30 mg at 11/16/16 1119  . methocarbamol (ROBAXIN) tablet 750 mg  750 mg Oral TID Ivor Costa, MD   750 mg at 11/16/16 1116  . metolazone (ZAROXOLYN) tablet 2.5 mg  2.5 mg Oral Daily Janece Canterbury, MD  2.5 mg at 11/16/16 1118  . MUSCLE RUB CREA   Topical PRN Short, Noah Delaine, MD      . ondansetron [facility]) tablet 4 mg  4 mg Oral Q6H PRN Ivor Costa, MD       Or  . ondansetron [facility]) injection 4 mg  4 mg Intravenous Q6H PRN Ivor Costa, MD      . phenylephrine (NEO-SYNEPHRINE) 0.25 % nasal spray 2 spray  2 spray Each Nare Q6H PRN Schorr, Rhetta Mura, NP   2 spray at 11/16/16 0248  . simethicone (MYLICON) chewable tablet 80 mg  80 mg Oral Q6H PRN Ivor Costa, MD      . sodium chloride flush (NS) 0.9 % injection 3 mL  3 mL Intravenous Q12H Ivor Costa, MD   3 mL at 11/16/16 1118  . torsemide (DEMADEX) tablet 80 mg  80 mg Oral Daily Ivor Costa, MD   80 mg at 11/16/16 1116     Discharge Medications: Please see discharge summary for a list of discharge medications.  Relevant Imaging Results:  Relevant Lab Results:   Additional Information SS#: [id number]  Jorge Ny, LCSW

## 2016-11-16 NOTE — Discharge Summary (Addendum)
Physician Discharge Summary  Juan [name] ASN:[id number] DOB: [birth year] DOA: 11/14/2016  PCP: Colon Branch, MD  Admit date: 11/14/2016 Discharge date: 11/16/2016  Admitted From: Home  Disposition:  SNF  Recommendations for Outpatient Follow-up:  1. Please obtain BMP/CBC in one week to follow-up creatinine and anemia 2. Follow-up pending blood cultures 3. Daily weights 4. Continue Augmentin through July 6, then stop 5. Needs to reestablish care with cardiology for his CAD and heart failure, last appointment was with Dr. Johnsie Cancel in 2016  Home Health:  Physical and occupational therapy  Equipment/Devices:  None  Discharge Condition:  Stable, improved CODE STATUS:  Full code  Diet recommendation:  Healthy heart   Brief/Interim Summary: The patient is a 76 year old male with history of hypertension, diet-controlled diabetes, chronic respiratory failure on 2 L oxygen, OSA not on CPAP, gout, myeloproliferative disease, diastolic heart failure, CAD, ascending aortic aneurysm, anemia, CKD-3 who presented with neck pain and fever. The patient was hospitalized from 6/16-6/20 with acute gout and pneumonia. He was discharged in stable condition. He was unable to afford the $9 prescription for Augmentin and therefore did not continue his antibiotics at home.  He was doing well into until 2 days prior to admission when he developed fever and neck pain. He had a temperature of 100 Fahrenheit at home. His pain was located in the left side of his neck, 8 out of 10 in severity. In the emergency department, he had a positive rapid strep test. His white blood cell count was 12.8, and he had a temperature of 100.5.  CT of the neck demonstrated no evidence of abscess or inflammatory process.   His neck pains were attributed to musculoskeletal strain/muscle spasm and gradually improved with heat packs, muscle relaxants, and Voltaren gel.  He did not have pain over the left carotid/jugular area and his muscle pains extended  into his paraspinous muscles, so likely not Lemierre's.  LIkely he had undertreated pneumonia from previous hospitalization and possibly new strep throat, symptoms improved on antibiotics.  His course was complicated by epistaxis from the right knee air which resolved with Neo-Synephrine spray.  Discharge Diagnoses:  Principal Problem:   Neck pain Active Problems:   Essential hypertension   Coronary atherosclerosis   Chronic diastolic (congestive) heart failure (HCC)   CKD (chronic kidney disease) stage 3, GFR 30-59 ml/min   Agnogenic myeloid metaplasia (HCC)   Anemia   Gout   Fever   Elevated troponin   Muscle spasm   Strep throat  Neck pain:improving.  Suspect muscle spasm - Continue voltaren gel  -continue home Norco prn and tylenol -continue Robaxin - Heating pads  Fever, leukocytosis with WBC 12.8. Urinalysis is negative. Chest x-rays negative for infiltration, but he may have some residual pneumonia from previous hospitalization that was not fully treated.  Alternatively, his positive rapid strep test c/w strep throat.  His fevers and leukocytosis gradually resolved on ceftriaxone. - given IV rocephin during hospitalization - blood cultures no growth to date - prn tylenol for fever - Continue Augmentin to complete a seven-day course to complete treatment for recent community-acquired pneumonia and also for his strep throat  HTN, blood pressure stable -continued Coreg, hydralazine  Coronary atherosclerosis and positive Trop:Patient troponin was 0.03, but denied any chest pain. Likely due to dehydration in the setting of CKD.   -continued aspirin, beta blocker, and imdur - LDL > 106.  Started statin  CKD-III:Stable. Baseline creatinine 1.2-1.5. Creatinine remained stable  Agnogenic myeloid metaplasia: per previous discharge  summary. "Has seen Dr. Marin Olp in the past (consult note 05/07/16 reviewed). Apparently he has refused a bone marrow biopsy and does not want any  treatment for this".  Iron deficiency anemia and mild acute blood loss anemia: Hemoglobin stable. 9.2, trended down gradually to 8.2 g/dl, likely partly hemodilutional, but also had a nosebleed in the setting of iron deficiency.   - Increased iron supplementation from once daily to three time daily - Repeat CBC in 1-2 weeks to check hemoglobin.    Gout, stable, continued prn colchicine  Chronic diastolic congestive heart failure: 2-D echo on 10/14/16 showed EF 50-60 percent with grade 1 diastolic dysfunction. Patient does not have leg edema JVD. CHF is compensated.  His metolazone and torsemide were initially held, but resumed prior to discharge. His creatinine remained stable and he continued to have no lower extremity edema. -  Continue daily weights  Generalized weakness -  PT/OT recommended SNF  Discharge Instructions  Discharge Instructions    (HEART FAILURE PATIENTS) Call MD:  Anytime you have any of the following symptoms: 1) 3 pound weight gain in 24 hours or 5 pounds in 1 week 2) shortness of breath, with or without a dry hacking cough 3) swelling in the hands, feet or stomach 4) if you have to sleep on extra pillows at night in order to breathe.    Complete by:  As directed    Diet - low sodium heart healthy    Complete by:  As directed    Increase activity slowly    Complete by:  As directed        Medication List    STOP taking these medications   docusate sodium 100 MG capsule Commonly known as:  COLACE   lidocaine 5 % ointment Commonly known as:  XYLOCAINE     TAKE these medications   acetaminophen 325 MG tablet Commonly known as:  TYLENOL Take 2 tablets (650 mg total) by mouth every 6 (six) hours as needed for mild pain, fever or headache. What changed:  medication strength  how much to take  reasons to take this   amoxicillin-clavulanate 500-125 MG tablet Commonly known as:  AUGMENTIN Take 1 tablet (500 mg total) by mouth every 12 (twelve) hours.    aspirin 325 MG tablet Take 325 mg by mouth daily.   atorvastatin 40 MG tablet Commonly known as:  LIPITOR Take 1 tablet (40 mg total) by mouth daily.   bisacodyl 10 MG suppository Commonly known as:  DULCOLAX Place 1 suppository (10 mg total) rectally daily as needed for moderate constipation.   carvedilol 3.125 MG tablet Commonly known as:  COREG Take 1 tablet (3.125 mg total) by mouth 2 (two) times daily with a meal.   colchicine 0.6 MG tablet Take 1 tablet (0.6 mg total) by mouth daily as needed.   diclofenac sodium 1 % Gel Commonly known as:  VOLTAREN Apply 4 g topically 2 (two) times daily as needed (pain). Apply to bilateral  knees   feeding supplement (GLUCERNA SHAKE) Liqd Take 118.5 mLs by mouth daily. Reported on 08/21/2015   ferrous gluconate 324 MG tablet Commonly known as:  FERGON Take 1 tablet (324 mg total) by mouth 3 (three) times daily with meals. What changed:  medication strength  how much to take  when to take this  additional instructions   fluticasone 50 MCG/ACT nasal spray Commonly known as:  FLONASE Place 2 sprays into both nostrils daily. Reported on 05/02/2015   gabapentin 100 MG  capsule Commonly known as:  NEURONTIN Take 2 capsules (200 mg total) by mouth 3 (three) times daily as needed (pain). Reported on 08/22/2015   hydrALAZINE 25 MG tablet Commonly known as:  APRESOLINE Take 25 mg by mouth 3 (three) times daily.   hydrocerin Crea Apply Eucerin cream to BLE Q day after bathing and roughly towel drying to remove loose skin What changed:  how much to take  how to take this  when to take this  reasons to take this  additional instructions   HYDROcodone-acetaminophen 5-325 MG tablet Commonly known as:  NORCO/VICODIN Take 1-2 tablets by mouth every 6 (six) hours as needed for moderate pain.   isosorbide mononitrate 30 MG 24 hr tablet Commonly known as:  IMDUR Take 30 mg by mouth 2 (two) times daily.   methocarbamol 750 MG  tablet Commonly known as:  ROBAXIN Take 1 tablet (750 mg total) by mouth 3 (three) times daily.   metolazone 2.5 MG tablet Commonly known as:  ZAROXOLYN Take 1 tablet (2.5 mg total) by mouth as directed. What changed:  when to take this   OXYGEN Inhale 2 L into the lungs continuous.   potassium chloride SA 20 MEQ tablet Commonly known as:  K-DUR,KLOR-CON Take 20 mEq by mouth daily. Taken with metolazone.   simethicone 80 MG chewable tablet Commonly known as:  MYLICON Chew 80 mg by mouth every 6 (six) hours as needed for flatulence.   torsemide 20 MG tablet Commonly known as:  DEMADEX Take 4 tablets (80 mg total) by mouth daily.   VITAMIN B COMPLEX PO Take 100 mg by mouth daily.      Follow-up Information    Wanda Plump, MD Follow up.   Specialty:  Internal Medicine Contact information: [address] [place] [address] [phone]          No Known Allergies  Consultations: none   Procedures/Studies: Dg Chest 2 View  Result Date: 11/14/2016 CLINICAL DATA:  76 y/o  M; fever. EXAM: CHEST  2 VIEW COMPARISON:  None. FINDINGS: Stable enlarged cardiac silhouette. Aortic atherosclerosis with calcification. Stable diffuse interstitial prominence probably representing edema. No pleural effusion. Dextrocurvature of thoracic spine. IMPRESSION: Stable diffuse interstitial prominence probably representing edema. Enlarged cardiac silhouette. Electronically Signed   By: Mitzi Hansen M.D.   On: 11/14/2016 02:52   Dg Chest 2 View  Result Date: 11/02/2016 CLINICAL DATA:  Nonproductive cough and shortness of breath intermittently for the past month. History of hypertension, diabetes, coronary artery disease, and CHF. Former smoker. EXAM: CHEST  2 VIEW COMPARISON:  Chest x-ray and October 31, 2016 and Oct 08, 2016. FINDINGS: The right hemidiaphragm remains higher than the left. The interstitial markings of both lungs are increased. The areas of confluent  density seen on the earlier study are less conspicuous today. The cardiac silhouette remains enlarged. The pulmonary vascularity is mildly prominent. There is no pleural effusion. IMPRESSION: Mild interval improvement in the appearance of the pulmonary interstitium. Persistent interstitial edema or pneumonia is present. Electronically Signed   By: David  [place] M.D.   On: 11/02/2016 15:37   Dg Chest 2 View  Result Date: 10/31/2016 CLINICAL DATA:  Arm pain. EXAM: CHEST  2 VIEW COMPARISON:  10/15/2016. FINDINGS: There is cardiac enlargement. Decreased lung volumes. Diffuse bilateral interstitial and airspace opacities are identified which may reflect pulmonary edema or multifocal infection. Diffuse abnormal sclerosis of the visualized osseous structures again noted. IMPRESSION: 1. Bilateral interstitial and airspace opacities  which may reflect pulmonary edema and/or infection. 2. Diffuse osteosclerosis noted. Electronically Signed   By: Kerby Moors M.D.   On: 10/31/2016 09:09   Dg Forearm Left  Result Date: 10/30/2016 CLINICAL DATA:  Arm pain beginning today. EXAM: LEFT FOREARM - 2 VIEW COMPARISON:  None. FINDINGS: Diffuse sclerotic and permeative appearance of the visualized skeleta, present elsewhere in the skeleton by chest CT since at least 2015. No superimposed fracture or malalignment. No suspected elbow joint effusion. IMPRESSION: 1. No acute finding. 2. Chronic sclerotic and permeative appearance of the skeleton in this patient with history of myeloproliferative disease. Electronically Signed   By: Monte Fantasia M.D.   On: 10/30/2016 17:22   Ct Soft Tissue Neck W Contrast  Result Date: 11/14/2016 CLINICAL DATA:  76 y/o M; neck pain and fever since last night. History of myeloproliferative disease. EXAM: CT NECK WITH CONTRAST TECHNIQUE: Multidetector CT imaging of the neck was performed using the standard protocol following the bolus administration of intravenous contrast. CONTRAST:  75 cc  Isovue-300 COMPARISON:  10/09/2016 cervical MRI FINDINGS: Pharynx and larynx: Normal. No mass or swelling. Salivary glands: No inflammation, mass, or stone. Thyroid: Normal. Lymph nodes: None enlarged or abnormal density. Vascular: Negative. Limited intracranial: Negative. Visualized orbits: Negative. Mastoids and visualized paranasal sinuses: Clear. Skeleton: Diffuse sclerosis of the bones compatible with sequelae of myeloproliferative disease and/or metabolic bone disease. Cervical spondylosis greatest at the C5 through C7 levels. No acute osseous abnormality is evident. Upper chest: Mosaic attenuation of lung parenchyma compatible small airways or small vessel disease. 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 Other: None. IMPRESSION: 1. No abscess or inflammatory process of the neck identified. 2. Diffuse bony stigmata of myeloproliferative disease and/or metabolic bone disease. 3. Mosaic attenuation of lung parenchyma may represent small airways or small vessel disease. 4. 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 Electronically Signed   By: Kristine Garbe M.D.   On: 11/14/2016 03:29   Ct Wrist Left Wo Contrast  Result Date: 11/03/2016 CLINICAL DATA:  Left wrist pain and swelling. EXAM: CT OF THE LEFT WRIST WITHOUT CONTRAST TECHNIQUE: Multidetector CT imaging was performed according to the standard protocol. Multiplanar CT image reconstructions were also generated. COMPARISON:  None. FINDINGS: The study is nondiagnostic. The patient's wrist had to be scanned at the patient's side due to limitation of patient positioning. In that position, image quality is nondiagnostic. IMPRESSION: CT scan of the left wrist is nondiagnostic. The patient will not be charged for this procedure. Electronically Signed   By: Lorriane Shire M.D.   On: 11/03/2016 09:52   Mr Wrist Left Wo Contrast  Result Date: 11/03/2016 CLINICAL DATA:  Progressively  worsening of left wrist pain and aching radiating into the left forearm with swelling over the past several days. History of myeloproliferative disorder. EXAM: MR OF THE LEFT WRIST WITHOUT CONTRAST TECHNIQUE: Multiplanar, multisequence MR imaging of the left wrist was performed. No intravenous contrast was administered. COMPARISON:  Plain films of the left forearm and hand 10/30/2016. FINDINGS: Please note that this study was ordered with contrast material but the patient was unable with to tolerate further scanning so contrast was not administered. No sagittal imaging is provided. Ligaments: Dorsal band of the scapholunate ligament is well visualized and intact. The volar band of the lunotriquetral ligament appears intact. There are tears of the interstitial portions of both the scapholunate and lunotriquetral ligaments. Small effusion in the distal radioulnar joint is identified. Triangular fibrocartilage: Tear is seen through  the disc of the triangular fibrocartilage. Tendons: Intact. Carpal tunnel/median nerve: Negative. Guyon's canal: Negative. Joint/cartilage: There is moderate appearing first CMC osteoarthritis. Bones/carpal alignment: Innumerable punctate foci of abnormal T1 signal are identified in all imaged bones with corresponding mildly increased T2 signal. No fracture. Other: No focal fluid collection. Subcutaneous edema is present about the dorsum of the hand and wrist. Imaged musculature also appears somewhat edematous. IMPRESSION: Soft tissue edema about the wrist is presumably due to dependent change or volume overload. Innumerable foci of abnormal marrow signal consistent with this patient's history of myeloproliferative disease or multiple myeloma. Negative for fracture or other acute bony abnormality. Tear of the disc of the triangular fibrocartilage. Tearing of interstitial portions of both the scapholunate and lunotriquetral ligaments. The ligaments are otherwise intact. Electronically Signed    By: Inge Rise M.D.   On: 11/03/2016 14:02   Dg Humerus Left  Result Date: 10/30/2016 CLINICAL DATA:  Left humerus pain beginning this morning. No specific injury. EXAM: LEFT HUMERUS - 2+ VIEW COMPARISON:  None. FINDINGS: Mottled sclerotic bones diffusely correlating with history of myeloproliferative disease. These changes have been seen elsewhere in the skeleton since at least 2015 chest CT. No acute fracture or focal bony erosion. No acute soft tissue finding. There is question of 13 mm nodule over the left mid lung, but none seen on dedicated chest x-ray from 2 weeks prior. IMPRESSION: 1. No acute finding. 2. Chronic sclerotic and permeative appearance of the skeleton correlating with history of myeloproliferative disease. Electronically Signed   By: Monte Fantasia M.D.   On: 10/30/2016 17:21   Dg Hand Complete Left  Result Date: 10/30/2016 CLINICAL DATA:  Generalized left upper extremity pain. EXAM: LEFT HAND - COMPLETE 3+ VIEW COMPARISON:  None. FINDINGS: Diffuse osteopenia. No evidence of fracture or dislocation. Osteoarthritic changes at the radiocarpal joint, intercarpal joints and first carpometacarpal joint. IMPRESSION: No acute fracture or dislocation identified about the left head. Multilevel osteoarthritic changes. Electronically Signed   By: Fidela Salisbury M.D.   On: 10/30/2016 17:23   Dg Fluoro Guided Needle Plc Aspiration/injection Loc  Result Date: 11/02/2016 CLINICAL DATA:  76 year old male with diffuse left wrist pain and swelling. Aspiration requested to evaluate for gout versus infection. FLUOROSCOPY TIME:  0 minutes 18 seconds for a total dose of 0.4 mGy PROCEDURE: LEFT WRIST ASPIRATION After a thorough discussion of risks and benefits of the procedure, written and oral informed consent was obtained. The consent discussion included the risk of bleeding, infection and injury to nerves and adjacent blood vessels. Extra-articular injection was also a possible risk  discussed. Verbal consent was obtained by Dr. Laurence Ferrari. Time out form Completed when appropriate. Preliminary localization was performed over the left wrist. The area was marked over dorsal scaphoid. After prep and drape in the usual sterile fashion, the skin and deeper subcutaneous tissues were anesthetized with 1% Lidocaine without Epinephrine. Under fluoroscopic guidance, a 19 gauge hypodermic needle was advanced into the joint between distal radius using a dorsal approach. Aspiration was performed yielding an no fluid. Therefore, a small volume of sterile saline was injected into the joint space without resistance. Aspiration was then performed an approximately 1 mL of the injected fluid was successfully re- aspirated. This was sent to the laboratory for further evaluation. The patient tolerated the procedure well and there were no complications. IMPRESSION: Successful left wrist fluoroscopically guided aspiration. Electronically Signed   By: Jacqulynn Cadet M.D.   On: 11/02/2016 15:58    Subjective: Still  has pain in the left neck and in the paraspinous muscles, but this is improving. He denies recent fevers or chills. Overnight he had an episode of epistaxis from the right near. Bleeding stopped after he was given Neo-Synephrine nasal spray. Denies any lower extremity edema. He has intermittent dyspnea but currently none.    Discharge Exam: Vitals:   11/16/16 0204 11/16/16 0537  BP: 140/69 138/70  Pulse: 93 93  Resp: 20 (!) 22  Temp: 98.5 F (36.9 C) 98.8 F (37.1 C)   Vitals:   11/15/16 2204 11/16/16 0204 11/16/16 0500 11/16/16 0537  BP: (!) 145/74 140/69  138/70  Pulse: 81 93  93  Resp: 18 20  (!) 22  Temp: 98.5 F (36.9 C) 98.5 F (36.9 C)  98.8 F (37.1 C)  TempSrc: Oral Oral    SpO2: 97% 91%  91%  Weight:   84.6 kg (186 lb 6.4 oz)   Height:        General: Adult male, lying in bed with his head midline and able to turn it without assistance slightly to the left HEENT:  Crusted blood in the right near, tongue is moist Cardiovascular: RRR, S1/S2 +, no rubs, no gallops Respiratory: CTA bilaterally, no wheezing, no rhonchi Abdominal: Soft, NT, ND, bowel sounds +  Extremities: Trace bilateral lower extremity edema, no cyanosis.  Wrist brace on the left wrist.  Persistent tenderness to palpation over the left trapezius muscle and along the paraspinous muscles right greater than left.     The results of significant diagnostics from this hospitalization (including imaging, microbiology, ancillary and laboratory) are listed below for reference.     Microbiology: Recent Results (from the past 240 hour(s))  Blood culture (routine x 2)     Status: None (Preliminary result)   Collection Time: 11/14/16  1:50 AM  Result Value Ref Range Status   Specimen Description BLOOD RIGHT ANTECUBITAL  Final   Special Requests   Final    BOTTLES DRAWN AEROBIC AND ANAEROBIC Blood Culture adequate volume   Culture NO GROWTH 1 DAY  Final   Report Status PENDING  Incomplete  Blood culture (routine x 2)     Status: None (Preliminary result)   Collection Time: 11/14/16  1:54 AM  Result Value Ref Range Status   Specimen Description BLOOD LEFT FOREARM  Final   Special Requests IN PEDIATRIC BOTTLE Blood Culture adequate volume  Final   Culture NO GROWTH 1 DAY  Final   Report Status PENDING  Incomplete  Rapid strep screen     Status: Abnormal   Collection Time: 11/14/16  1:59 AM  Result Value Ref Range Status   Streptococcus, Group A Screen (Direct) POSITIVE (A) NEGATIVE Final  Urine Culture     Status: Abnormal   Collection Time: 11/14/16  3:45 AM  Result Value Ref Range Status   Specimen Description URINE, RANDOM  Final   Special Requests NONE  Final   Culture <10,000 COLONIES/mL INSIGNIFICANT GROWTH (A)  Final   Report Status 11/15/2016 FINAL  Final  MRSA PCR Screening     Status: None   Collection Time: 11/14/16 12:28 PM  Result Value Ref Range Status   MRSA by PCR NEGATIVE  NEGATIVE Final    Comment:        The GeneXpert MRSA Assay (FDA approved for NASAL specimens only), is one component of a comprehensive MRSA colonization surveillance program. It is not intended to diagnose MRSA infection nor to guide or monitor treatment for MRSA  infections.      Labs: BNP (last 3 results)  Recent Labs  10/12/16 1030 10/31/16 1605 11/14/16 0504  BNP 989.3* 389.5* 026.3*   Basic Metabolic Panel:  Recent Labs Lab 11/14/16 0154 11/14/16 0504 11/15/16 0533 11/16/16 0529  NA 139 139 136 138  K 3.6 3.9 4.0 3.9  CL 103 103 101 103  CO2 '27 24 26 26  '$ GLUCOSE 122* 121* 107* 108*  BUN 65* 64* 56* 49*  CREATININE 1.13 1.14 1.22 1.09  CALCIUM 9.4 9.3 9.2 9.0   Liver Function Tests:  Recent Labs Lab 11/14/16 0154  AST 25  ALT 13*  ALKPHOS 105  BILITOT 0.5  PROT 5.9*  ALBUMIN 3.0*   No results for input(s): LIPASE, AMYLASE in the last 168 hours. No results for input(s): AMMONIA in the last 168 hours. CBC:  Recent Labs Lab 11/14/16 0154 11/14/16 0504 11/15/16 0533 11/16/16 0529  WBC 12.8* 12.7* 10.8* 9.8  NEUTROABS 7.3  --   --   --   HGB 9.4* 10.7* 8.5* 8.2*  HCT 30.5* 35.5* 27.4* 27.3*  MCV 82.0 83.1 82.5 82.0  PLT 432* 401* 368 355   Cardiac Enzymes:  Recent Labs Lab 11/14/16 0158 11/14/16 0504 11/14/16 1005 11/14/16 1607  TROPONINI 0.03* 0.03* 0.03* 0.03*   BNP: Invalid input(s): POCBNP CBG:  Recent Labs Lab 11/14/16 0817 11/16/16 0809  GLUCAP 108* 100*   D-Dimer No results for input(s): DDIMER in the last 72 hours. Hgb A1c No results for input(s): HGBA1C in the last 72 hours. Lipid Profile  Recent Labs  11/14/16 0504  CHOL 176  HDL 59  LDLCALC 106*  TRIG 56  CHOLHDL 3.0   Thyroid function studies No results for input(s): TSH, T4TOTAL, T3FREE, THYROIDAB in the last 72 hours.  Invalid input(s): FREET3 Anemia work up No results for input(s): VITAMINB12, FOLATE, FERRITIN, TIBC, IRON, RETICCTPCT in the  last 72 hours. Urinalysis    Component Value Date/Time   COLORURINE YELLOW 11/14/2016 0345   APPEARANCEUR CLEAR 11/14/2016 0345   LABSPEC 1.010 11/14/2016 0345   PHURINE 5.0 11/14/2016 0345   GLUCOSEU NEGATIVE 11/14/2016 0345   GLUCOSEU NEGATIVE 05/02/2015 1008   HGBUR NEGATIVE 11/14/2016 0345   BILIRUBINUR NEGATIVE 11/14/2016 0345   KETONESUR NEGATIVE 11/14/2016 0345   PROTEINUR 100 (A) 11/14/2016 0345   UROBILINOGEN 0.2 05/02/2015 1008   NITRITE NEGATIVE 11/14/2016 0345   LEUKOCYTESUR NEGATIVE 11/14/2016 0345   Sepsis Labs Invalid input(s): PROCALCITONIN,  WBC,  LACTICIDVEN   Time coordinating discharge: Over 30 minutes  SIGNED:   Janece Canterbury, MD  [facility] 11/16/2016, 10:09 AM Pager   If 7PM-7AM, please contact night-coverage [email or web address] Password [email or web address]

## 2016-11-16 NOTE — Progress Notes (Addendum)
Report called to [facility] and transport called for pick up.  [name] to be D/C'd Skilled nursing facility per MD order.  Discussed with the patient and all questions fully answered.  VSS, Skin clean, dry and intact without evidence of skin break down, no evidence of skin tears noted. IV catheter discontinued intact. Site without signs and symptoms of complications. Dressing and pressure applied.  An After Visit Summary was printed and given to the patient. Patient received prescription.  D/c education completed with patient/family including follow up instructions, medication list, d/c activities limitations if indicated, with other d/c instructions as indicated by MD - patient able to verbalize understanding, all questions fully answered.   Patient instructed to return to ED, call 911, or call MD for any changes in condition.     Juan [name] 11/16/2016 7:11 PM

## 2016-11-16 NOTE — Telephone Encounter (Signed)
Caller name:[facility] Relationship to patient: Can be reached:[phone] Pharmacy:  Reason for call:home health eval was done on 11/13/16, pt is requesting to go to inpatient rehab. Please advise.

## 2016-11-17 ENCOUNTER — Telehealth: Payer: Self-pay | Admitting: Behavioral Health

## 2016-11-17 NOTE — Telephone Encounter (Signed)
Attempted to reach patient for TCM/Hospital Follow-up call. Unable to leave a message; per recording, the voice mailbox has not been setup.

## 2016-11-19 ENCOUNTER — Inpatient Hospital Stay (HOSPITAL_COMMUNITY): Admission: RE | Admit: 2016-11-19 | Payer: Self-pay | Source: Ambulatory Visit

## 2016-11-19 DIAGNOSIS — I10 Essential (primary) hypertension: Secondary | ICD-10-CM | POA: Diagnosis not present

## 2016-11-19 DIAGNOSIS — N183 Chronic kidney disease, stage 3 (moderate): Secondary | ICD-10-CM | POA: Diagnosis not present

## 2016-11-19 DIAGNOSIS — M542 Cervicalgia: Secondary | ICD-10-CM | POA: Diagnosis not present

## 2016-11-19 DIAGNOSIS — I5032 Chronic diastolic (congestive) heart failure: Secondary | ICD-10-CM | POA: Diagnosis not present

## 2016-11-19 LAB — CULTURE, BLOOD (ROUTINE X 2)
CULTURE: NO GROWTH
Culture: NO GROWTH
SPECIAL REQUESTS: ADEQUATE
Special Requests: ADEQUATE

## 2016-11-19 NOTE — Telephone Encounter (Signed)
Patient voiced that he's currently admitted to [facility] and will call to schedule an appointment once he discharges from there.

## 2016-11-19 NOTE — Telephone Encounter (Signed)
Received the CMN from [facility] for Oxygen that originated on 10/21/16, stating that "it was filled out using a test that was done too early. Medicare requires that we use the most recent test"; they included a copy of that test and ask that we "cross out both answers at questions 1B and 1C and fill in as follows: 1b - 86, 1c - 10/17/16; Dr. Larose Kells will need to Initial and Date by this correction", corrections made; forwarded to provider/SLS 07/05

## 2016-11-23 DIAGNOSIS — M6281 Muscle weakness (generalized): Secondary | ICD-10-CM | POA: Diagnosis not present

## 2016-11-24 ENCOUNTER — Telehealth: Payer: Self-pay | Admitting: *Deleted

## 2016-11-24 DIAGNOSIS — I5032 Chronic diastolic (congestive) heart failure: Secondary | ICD-10-CM | POA: Diagnosis not present

## 2016-11-24 DIAGNOSIS — M25551 Pain in right hip: Secondary | ICD-10-CM | POA: Diagnosis not present

## 2016-11-24 DIAGNOSIS — N183 Chronic kidney disease, stage 3 (moderate): Secondary | ICD-10-CM | POA: Diagnosis not present

## 2016-11-24 NOTE — Telephone Encounter (Signed)
Received Physician Orders from [facility], forwarded to provider/SLS 07/10

## 2016-11-24 NOTE — Telephone Encounter (Signed)
Received Home Health Certification Addendum, face-to-face encounter with signed visit note attached; forwarded to provider/SLS 07/10

## 2016-11-24 NOTE — Telephone Encounter (Signed)
Form faxed to [phone], form sent for scanning.

## 2016-11-24 NOTE — Telephone Encounter (Signed)
Form signed by PCP and faxed to [facility] at [phone]. Forms sent for scanning.

## 2016-11-24 NOTE — Telephone Encounter (Signed)
Received Home Health Certification and Plan of Care; forwarded to provider/SLS 07/10

## 2016-11-24 NOTE — Telephone Encounter (Signed)
Signed cert and plan of care faxed to [facility] at [phone]. Forms sent for scanning.

## 2016-11-26 DIAGNOSIS — M6281 Muscle weakness (generalized): Secondary | ICD-10-CM | POA: Diagnosis not present

## 2016-11-30 DIAGNOSIS — M6281 Muscle weakness (generalized): Secondary | ICD-10-CM | POA: Diagnosis not present

## 2016-12-01 DIAGNOSIS — I25119 Atherosclerotic heart disease of native coronary artery with unspecified angina pectoris: Secondary | ICD-10-CM | POA: Diagnosis not present

## 2016-12-01 DIAGNOSIS — D649 Anemia, unspecified: Secondary | ICD-10-CM | POA: Diagnosis not present

## 2016-12-01 DIAGNOSIS — M25551 Pain in right hip: Secondary | ICD-10-CM | POA: Diagnosis not present

## 2016-12-03 DIAGNOSIS — M6281 Muscle weakness (generalized): Secondary | ICD-10-CM | POA: Diagnosis not present

## 2016-12-08 DIAGNOSIS — I10 Essential (primary) hypertension: Secondary | ICD-10-CM | POA: Diagnosis not present

## 2016-12-08 DIAGNOSIS — N183 Chronic kidney disease, stage 3 (moderate): Secondary | ICD-10-CM | POA: Diagnosis not present

## 2016-12-08 DIAGNOSIS — I5032 Chronic diastolic (congestive) heart failure: Secondary | ICD-10-CM | POA: Diagnosis not present

## 2016-12-08 DIAGNOSIS — D649 Anemia, unspecified: Secondary | ICD-10-CM | POA: Diagnosis not present

## 2016-12-11 ENCOUNTER — Other Ambulatory Visit (HOSPITAL_COMMUNITY): Payer: Self-pay

## 2016-12-11 NOTE — Progress Notes (Signed)
chp visit  Pt phone is going straight to vm, therefore I came by to check on him without an appointment.  Pt's son answered the door very sweaty and stated that his father is sick and doesn't want "company".  I advised his son that I can assess pt and check him out but he refused and told me that he will let his father know that I came by.  The last time I was inside of pt's home it was very hot due to him not wanting to use the air conditioner.  The temp is going to be very high today, therefore im very concerned about pt's wellbeing.  I will contact the Jackie B. @ [facility].

## 2016-12-14 ENCOUNTER — Inpatient Hospital Stay: Payer: Self-pay | Admitting: Internal Medicine

## 2016-12-14 DIAGNOSIS — Z0289 Encounter for other administrative examinations: Secondary | ICD-10-CM

## 2016-12-20 ENCOUNTER — Inpatient Hospital Stay (HOSPITAL_COMMUNITY)
Admission: EM | Admit: 2016-12-20 | Discharge: 2016-12-24 | DRG: 690 | Disposition: A | Discharge status: Home Health | Payer: Medicare Other | Attending: Internal Medicine | Admitting: Internal Medicine

## 2016-12-20 ENCOUNTER — Emergency Department (HOSPITAL_COMMUNITY): Payer: Medicare Other

## 2016-12-20 ENCOUNTER — Observation Stay (HOSPITAL_COMMUNITY): Payer: Medicare Other

## 2016-12-20 ENCOUNTER — Encounter (HOSPITAL_COMMUNITY): Payer: Self-pay | Admitting: Emergency Medicine

## 2016-12-20 DIAGNOSIS — E1122 Type 2 diabetes mellitus with diabetic chronic kidney disease: Secondary | ICD-10-CM | POA: Diagnosis present

## 2016-12-20 DIAGNOSIS — N401 Enlarged prostate with lower urinary tract symptoms: Secondary | ICD-10-CM

## 2016-12-20 DIAGNOSIS — R339 Retention of urine, unspecified: Secondary | ICD-10-CM | POA: Diagnosis present

## 2016-12-20 DIAGNOSIS — Z7982 Long term (current) use of aspirin: Secondary | ICD-10-CM

## 2016-12-20 DIAGNOSIS — Z9981 Dependence on supplemental oxygen: Secondary | ICD-10-CM

## 2016-12-20 DIAGNOSIS — I5032 Chronic diastolic (congestive) heart failure: Secondary | ICD-10-CM | POA: Diagnosis present

## 2016-12-20 DIAGNOSIS — G4733 Obstructive sleep apnea (adult) (pediatric): Secondary | ICD-10-CM | POA: Diagnosis present

## 2016-12-20 DIAGNOSIS — M25551 Pain in right hip: Secondary | ICD-10-CM | POA: Diagnosis present

## 2016-12-20 DIAGNOSIS — N183 Chronic kidney disease, stage 3 unspecified: Secondary | ICD-10-CM | POA: Diagnosis present

## 2016-12-20 DIAGNOSIS — I2510 Atherosclerotic heart disease of native coronary artery without angina pectoris: Secondary | ICD-10-CM | POA: Diagnosis present

## 2016-12-20 DIAGNOSIS — M25559 Pain in unspecified hip: Secondary | ICD-10-CM

## 2016-12-20 DIAGNOSIS — D471 Chronic myeloproliferative disease: Secondary | ICD-10-CM | POA: Diagnosis not present

## 2016-12-20 DIAGNOSIS — R079 Chest pain, unspecified: Secondary | ICD-10-CM | POA: Diagnosis not present

## 2016-12-20 DIAGNOSIS — D509 Iron deficiency anemia, unspecified: Secondary | ICD-10-CM | POA: Diagnosis present

## 2016-12-20 DIAGNOSIS — D519 Vitamin B12 deficiency anemia, unspecified: Secondary | ICD-10-CM | POA: Diagnosis present

## 2016-12-20 DIAGNOSIS — Z79899 Other long term (current) drug therapy: Secondary | ICD-10-CM

## 2016-12-20 DIAGNOSIS — M810 Age-related osteoporosis without current pathological fracture: Secondary | ICD-10-CM | POA: Diagnosis present

## 2016-12-20 DIAGNOSIS — N136 Pyonephrosis: Principal | ICD-10-CM | POA: Diagnosis present

## 2016-12-20 DIAGNOSIS — N400 Enlarged prostate without lower urinary tract symptoms: Secondary | ICD-10-CM | POA: Diagnosis present

## 2016-12-20 DIAGNOSIS — R161 Splenomegaly, not elsewhere classified: Secondary | ICD-10-CM | POA: Diagnosis present

## 2016-12-20 DIAGNOSIS — M545 Low back pain: Secondary | ICD-10-CM | POA: Diagnosis not present

## 2016-12-20 DIAGNOSIS — J9611 Chronic respiratory failure with hypoxia: Secondary | ICD-10-CM | POA: Diagnosis not present

## 2016-12-20 DIAGNOSIS — R0989 Other specified symptoms and signs involving the circulatory and respiratory systems: Secondary | ICD-10-CM | POA: Diagnosis present

## 2016-12-20 DIAGNOSIS — N179 Acute kidney failure, unspecified: Secondary | ICD-10-CM | POA: Diagnosis present

## 2016-12-20 DIAGNOSIS — I130 Hypertensive heart and chronic kidney disease with heart failure and stage 1 through stage 4 chronic kidney disease, or unspecified chronic kidney disease: Secondary | ICD-10-CM | POA: Diagnosis present

## 2016-12-20 DIAGNOSIS — R509 Fever, unspecified: Secondary | ICD-10-CM

## 2016-12-20 DIAGNOSIS — D649 Anemia, unspecified: Secondary | ICD-10-CM

## 2016-12-20 DIAGNOSIS — M109 Gout, unspecified: Secondary | ICD-10-CM | POA: Diagnosis present

## 2016-12-20 DIAGNOSIS — Z87891 Personal history of nicotine dependence: Secondary | ICD-10-CM

## 2016-12-20 DIAGNOSIS — E785 Hyperlipidemia, unspecified: Secondary | ICD-10-CM | POA: Diagnosis present

## 2016-12-20 DIAGNOSIS — R109 Unspecified abdominal pain: Secondary | ICD-10-CM | POA: Diagnosis not present

## 2016-12-20 DIAGNOSIS — R338 Other retention of urine: Secondary | ICD-10-CM

## 2016-12-20 DIAGNOSIS — D7581 Myelofibrosis: Secondary | ICD-10-CM | POA: Diagnosis present

## 2016-12-20 DIAGNOSIS — N3000 Acute cystitis without hematuria: Secondary | ICD-10-CM | POA: Diagnosis not present

## 2016-12-20 DIAGNOSIS — R531 Weakness: Secondary | ICD-10-CM | POA: Diagnosis not present

## 2016-12-20 DIAGNOSIS — K5790 Diverticulosis of intestine, part unspecified, without perforation or abscess without bleeding: Secondary | ICD-10-CM | POA: Diagnosis not present

## 2016-12-20 LAB — CBC
HCT: 28 % — ABNORMAL LOW (ref 39.0–52.0)
Hemoglobin: 8.5 g/dL — ABNORMAL LOW (ref 13.0–17.0)
MCH: 25.2 pg — ABNORMAL LOW (ref 26.0–34.0)
MCHC: 30.4 g/dL (ref 30.0–36.0)
MCV: 83.1 fL (ref 78.0–100.0)
PLATELETS: 333 10*3/uL (ref 150–400)
RBC: 3.37 MIL/uL — AB (ref 4.22–5.81)
RDW: 20.4 % — AB (ref 11.5–15.5)
WBC: 9.4 10*3/uL (ref 4.0–10.5)

## 2016-12-20 LAB — URINALYSIS, ROUTINE W REFLEX MICROSCOPIC
BILIRUBIN URINE: NEGATIVE
Glucose, UA: NEGATIVE mg/dL
KETONES UR: NEGATIVE mg/dL
NITRITE: NEGATIVE
PH: 6 (ref 5.0–8.0)
Protein, ur: >300 mg/dL — AB
Specific Gravity, Urine: 1.02 (ref 1.005–1.030)

## 2016-12-20 LAB — CBC WITH DIFFERENTIAL/PLATELET
BASOS PCT: 4 %
Basophils Absolute: 0.4 10*3/uL — ABNORMAL HIGH (ref 0.0–0.1)
EOS ABS: 0.2 10*3/uL (ref 0.0–0.7)
Eosinophils Relative: 3 %
LYMPHS ABS: 2.5 10*3/uL (ref 0.7–4.0)
LYMPHS PCT: 26 %
MONO ABS: 1.4 10*3/uL — AB (ref 0.1–1.0)
Monocytes Relative: 14 %
NEUTROS PCT: 53 %
Neutro Abs: 5 10*3/uL (ref 1.7–7.7)

## 2016-12-20 LAB — BASIC METABOLIC PANEL
Anion gap: 6 (ref 5–15)
BUN: 26 mg/dL — AB (ref 6–20)
CALCIUM: 9.5 mg/dL (ref 8.9–10.3)
CO2: 24 mmol/L (ref 22–32)
CREATININE: 0.92 mg/dL (ref 0.61–1.24)
Chloride: 110 mmol/L (ref 101–111)
GFR calc non Af Amer: >60 mL/min (ref >60)
Glucose, Bld: 101 mg/dL — ABNORMAL HIGH (ref 65–99)
Potassium: 4 mmol/L (ref 3.5–5.1)
SODIUM: 140 mmol/L (ref 135–145)

## 2016-12-20 LAB — URINALYSIS, MICROSCOPIC (REFLEX): Squamous Epithelial / LPF: NONE SEEN

## 2016-12-20 LAB — I-STAT CG4 LACTIC ACID, ED
LACTIC ACID, VENOUS: 0.58 mmol/L (ref 0.5–1.9)
Lactic Acid, Venous: 0.47 mmol/L — ABNORMAL LOW (ref 0.5–1.9)

## 2016-12-20 LAB — CK: Total CK: 10 U/L — ABNORMAL LOW (ref 49–397)

## 2016-12-20 MED ORDER — ACETAMINOPHEN 325 MG PO TABS
650.0000 mg | ORAL_TABLET | Freq: Once | ORAL | Status: AC
  Administered 2016-12-20: 650 mg via ORAL
  Filled 2016-12-20: qty 2

## 2016-12-20 MED ORDER — ONDANSETRON 4 MG PO TBDP
4.0000 mg | ORAL_TABLET | Freq: Once | ORAL | Status: AC
  Administered 2016-12-20: 4 mg via ORAL
  Filled 2016-12-20: qty 1

## 2016-12-20 MED ORDER — MORPHINE SULFATE (PF) 4 MG/ML IV SOLN
4.0000 mg | Freq: Once | INTRAVENOUS | Status: AC
  Administered 2016-12-20: 4 mg via INTRAVENOUS
  Filled 2016-12-20: qty 1

## 2016-12-20 MED ORDER — POLYETHYLENE GLYCOL 3350 17 G PO PACK
17.0000 g | PACK | Freq: Every day | ORAL | Status: DC | PRN
  Administered 2016-12-23: 17 g via ORAL
  Filled 2016-12-20: qty 1

## 2016-12-20 MED ORDER — IOPAMIDOL (ISOVUE-300) INJECTION 61%
INTRAVENOUS | Status: AC
  Filled 2016-12-20: qty 30

## 2016-12-20 MED ORDER — FERROUS GLUCONATE 324 (38 FE) MG PO TABS
324.0000 mg | ORAL_TABLET | Freq: Two times a day (BID) | ORAL | Status: DC
  Administered 2016-12-21 (×2): 324 mg via ORAL
  Filled 2016-12-20 (×2): qty 1

## 2016-12-20 MED ORDER — CARVEDILOL 3.125 MG PO TABS
3.1250 mg | ORAL_TABLET | Freq: Two times a day (BID) | ORAL | Status: DC
  Administered 2016-12-20 – 2016-12-24 (×8): 3.125 mg via ORAL
  Filled 2016-12-20 (×8): qty 1

## 2016-12-20 MED ORDER — VITAMIN B COMPLEX PO TABS: ORAL_TABLET | Freq: Every day | ORAL | Status: DC

## 2016-12-20 MED ORDER — SODIUM CHLORIDE 0.9 % IV BOLUS (SEPSIS)
500.0000 mL | Freq: Once | INTRAVENOUS | Status: AC
  Administered 2016-12-20: 500 mL via INTRAVENOUS

## 2016-12-20 MED ORDER — GABAPENTIN 100 MG PO CAPS
200.0000 mg | ORAL_CAPSULE | Freq: Two times a day (BID) | ORAL | Status: DC
  Administered 2016-12-20 – 2016-12-24 (×8): 200 mg via ORAL
  Filled 2016-12-20 (×8): qty 2

## 2016-12-20 MED ORDER — ONDANSETRON HCL 4 MG/2ML IJ SOLN: 4.0000 mg | Freq: Four times a day (QID) | INTRAMUSCULAR | Status: DC | PRN

## 2016-12-20 MED ORDER — METHOCARBAMOL 500 MG PO TABS
750.0000 mg | ORAL_TABLET | Freq: Three times a day (TID) | ORAL | Status: DC
  Administered 2016-12-20 – 2016-12-24 (×10): 750 mg via ORAL
  Filled 2016-12-20: qty 1
  Filled 2016-12-20: qty 2
  Filled 2016-12-20 (×5): qty 1
  Filled 2016-12-20: qty 2
  Filled 2016-12-20 (×3): qty 1

## 2016-12-20 MED ORDER — KETOROLAC TROMETHAMINE 30 MG/ML IJ SOLN
15.0000 mg | Freq: Once | INTRAMUSCULAR | Status: AC
  Administered 2016-12-20: 15 mg via INTRAVENOUS
  Filled 2016-12-20: qty 1

## 2016-12-20 MED ORDER — SIMETHICONE 80 MG PO CHEW: 80.0000 mg | CHEWABLE_TABLET | Freq: Four times a day (QID) | ORAL | Status: DC | PRN

## 2016-12-20 MED ORDER — ATORVASTATIN CALCIUM 40 MG PO TABS
40.0000 mg | ORAL_TABLET | Freq: Every day | ORAL | Status: DC
  Administered 2016-12-20 – 2016-12-24 (×5): 40 mg via ORAL
  Filled 2016-12-20 (×5): qty 1

## 2016-12-20 MED ORDER — GLUCERNA SHAKE PO LIQD
118.5000 mL | Freq: Every day | ORAL | Status: DC
  Administered 2016-12-20 – 2016-12-24 (×5): 118.5 mL via ORAL

## 2016-12-20 MED ORDER — ENOXAPARIN SODIUM 40 MG/0.4ML ~~LOC~~ SOLN
40.0000 mg | SUBCUTANEOUS | Status: DC
  Administered 2016-12-20 – 2016-12-23 (×4): 40 mg via SUBCUTANEOUS
  Filled 2016-12-20 (×4): qty 0.4

## 2016-12-20 MED ORDER — COLCHICINE 0.6 MG PO TABS
0.6000 mg | ORAL_TABLET | Freq: Every day | ORAL | Status: DC
  Administered 2016-12-21 – 2016-12-24 (×4): 0.6 mg via ORAL
  Filled 2016-12-20 (×4): qty 1

## 2016-12-20 MED ORDER — ASPIRIN 325 MG PO TABS
325.0000 mg | ORAL_TABLET | Freq: Every day | ORAL | Status: DC
  Administered 2016-12-21 – 2016-12-24 (×4): 325 mg via ORAL
  Filled 2016-12-20 (×4): qty 1

## 2016-12-20 MED ORDER — HYDRALAZINE HCL 50 MG PO TABS
50.0000 mg | ORAL_TABLET | Freq: Three times a day (TID) | ORAL | Status: DC
  Administered 2016-12-20 – 2016-12-24 (×11): 50 mg via ORAL
  Filled 2016-12-20 (×11): qty 1

## 2016-12-20 MED ORDER — ACETAMINOPHEN 325 MG PO TABS: 650.0000 mg | ORAL_TABLET | Freq: Four times a day (QID) | ORAL | Status: DC | PRN

## 2016-12-20 MED ORDER — B COMPLEX-C PO TABS
1.0000 | ORAL_TABLET | Freq: Every day | ORAL | Status: DC
  Administered 2016-12-20 – 2016-12-24 (×5): 1 via ORAL
  Filled 2016-12-20 (×5): qty 1

## 2016-12-20 MED ORDER — TORSEMIDE 20 MG PO TABS
80.0000 mg | ORAL_TABLET | Freq: Every day | ORAL | Status: DC
  Administered 2016-12-21 – 2016-12-22 (×2): 80 mg via ORAL
  Filled 2016-12-20 (×2): qty 4

## 2016-12-20 MED ORDER — DEXTROSE 5 % IV SOLN
1.0000 g | INTRAVENOUS | Status: DC
  Administered 2016-12-21 – 2016-12-22 (×3): 1 g via INTRAVENOUS
  Filled 2016-12-20 (×3): qty 10

## 2016-12-20 MED ORDER — BISACODYL 10 MG RE SUPP: 10.0000 mg | Freq: Every day | RECTAL | Status: DC | PRN

## 2016-12-20 MED ORDER — HYDROCODONE-ACETAMINOPHEN 5-325 MG PO TABS
1.0000 | ORAL_TABLET | Freq: Four times a day (QID) | ORAL | Status: DC | PRN
  Administered 2016-12-21 – 2016-12-23 (×5): 2 via ORAL
  Administered 2016-12-23: 1 via ORAL
  Administered 2016-12-23 – 2016-12-24 (×2): 2 via ORAL
  Filled 2016-12-20 (×3): qty 2
  Filled 2016-12-20: qty 1
  Filled 2016-12-20 (×4): qty 2

## 2016-12-20 MED ORDER — FLUTICASONE PROPIONATE 50 MCG/ACT NA SUSP
2.0000 | Freq: Every day | NASAL | Status: DC
  Administered 2016-12-20 – 2016-12-24 (×5): 2 via NASAL
  Filled 2016-12-20: qty 16

## 2016-12-20 MED ORDER — ISOSORBIDE MONONITRATE ER 30 MG PO TB24
30.0000 mg | ORAL_TABLET | Freq: Two times a day (BID) | ORAL | Status: DC
  Administered 2016-12-20 – 2016-12-24 (×8): 30 mg via ORAL
  Filled 2016-12-20 (×8): qty 1

## 2016-12-20 MED ORDER — ONDANSETRON HCL 4 MG PO TABS: 4.0000 mg | ORAL_TABLET | Freq: Four times a day (QID) | ORAL | Status: DC | PRN

## 2016-12-20 MED ORDER — OXYCODONE HCL 5 MG PO TABS
2.5000 mg | ORAL_TABLET | Freq: Once | ORAL | Status: AC
  Administered 2016-12-20: 2.5 mg via ORAL
  Filled 2016-12-20: qty 1

## 2016-12-20 MED ORDER — DICLOFENAC SODIUM 1 % TD GEL: 4.0000 g | Freq: Two times a day (BID) | TRANSDERMAL | Status: DC | PRN

## 2016-12-20 MED ORDER — POTASSIUM CHLORIDE CRYS ER 20 MEQ PO TBCR
20.0000 meq | EXTENDED_RELEASE_TABLET | Freq: Every day | ORAL | Status: DC
  Administered 2016-12-21 – 2016-12-22 (×2): 20 meq via ORAL
  Filled 2016-12-20 (×2): qty 1

## 2016-12-20 MED ORDER — DIPHENHYDRAMINE HCL 50 MG/ML IJ SOLN
12.5000 mg | Freq: Once | INTRAMUSCULAR | Status: AC
  Administered 2016-12-20: 12.5 mg via INTRAVENOUS
  Filled 2016-12-20: qty 1

## 2016-12-20 MED ORDER — METOLAZONE 2.5 MG PO TABS
2.5000 mg | ORAL_TABLET | Freq: Every day | ORAL | Status: DC
  Administered 2016-12-21 – 2016-12-22 (×2): 2.5 mg via ORAL
  Filled 2016-12-20 (×2): qty 1

## 2016-12-20 NOTE — ED Notes (Signed)
Attempted to ambulate pt. Pt refusing to ambulate- states he is too sore at this time to move. RN explained to pt that he needs to ambulate so we can make sure he is safe to walk at home. Pt states he is able to go home, but will not ambulate now.

## 2016-12-20 NOTE — H&P (Signed)
History and Physical    Juan [name] SEG:[id number] DOB: 28-Jul-1940 DOA: 12/20/2016  PCP: Colon Branch, MD  Patient coming from:home  Chief Complaint:right hip pain  HPI: Juan [name] is a 76 y.o. male with medical history significant of coronary artery disease, chronic diastolic congestive heart failure on diuretics, history of myeloproliferative disease did not follow up with oncologist, chronic anemia, chronic respiratory failure with hypoxia on 2 L of oxygen at home, gout presented with worsening right hip and flank pain. Patient is a poor historian. He reported intermittent chills, nausea one episode of vomiting yesterday. The pain on his right hip worsen to the point that he had difficulty walking. He walks with a cane. Denied chest pain, shortness of breath, abdominal pain. No headache or dizziness. Reports of dysuria and urgency. No diarrhea or constipation. He lives with his son. ED Course: In the ER labs consistent with his chronic anemia.  Review of Systems: As per HPI otherwise 10 point review of systems negative. Right hip x-ray consistent with diffuse sclerotic disease which is similar to prior CAT scan concerning for osseous metastasis. No fracture or dislocation seen. Patient was treated with pain medication and admitted for further evaluation.   Past Medical History:  Diagnosis Date  . Allergic rhinitis   . Anemia   . Ascending aortic aneurysm ([place]) 03/2014   4.3cm on CT scan  . CAD (coronary artery disease)    dx elsewheer in past, no documentation. Non-ischemic myoview 2007  . Chronic diastolic CHF (congestive heart failure), NYHA class 2 (HCC)    Normal EF w/ grade 1 dd by echo 12/2015  . Edema    R>L leg, u/s 5-12 neg for DVT  . Hemorrhoid   . History of thrombocytosis   . Hypertension   . Migraine    "once/wk at least" (07/11/2013)  . Myeloproliferative disease ([place])   . Shortness of breath   . Sinus congestion   . Sleep apnea, obstructive    at some point used CPAP,  was d/c  years ago  . Type II diabetes mellitus ([place])     Past Surgical History:  Procedure Laterality Date  . TOE SURGERY Right    "tried to straighten out big toe" (07/11/2013)    Social history: reports that he quit smoking about 50 years ago. His smoking use included Cigarettes. He has a 3.00 pack-year smoking history. He has never used smokeless tobacco. He reports that he does not drink alcohol or use drugs.  No Known Allergies  Family History  Problem Relation Age of Onset  . Schizophrenia Son   . Colon cancer Neg Hx   . Prostate cancer Neg Hx   . Heart attack Neg Hx   . Diabetes Neg Hx      Prior to Admission medications   Medication Sig Start Date End Date Taking? Authorizing Provider  acetaminophen (TYLENOL) 325 MG tablet Take 2 tablets (650 mg total) by mouth every 6 (six) hours as needed for mild pain, fever or headache. 11/16/16  Yes Short, Noah Delaine, MD  aspirin 325 MG tablet Take 325 mg by mouth daily.   Yes [provider]  atorvastatin (LIPITOR) 40 MG tablet Take 1 tablet (40 mg total) by mouth daily. 11/16/16 11/16/17 Yes Short, Noah Delaine, MD  B Complex Vitamins (VITAMIN B COMPLEX PO) Take 100 mg by mouth daily.   Yes [provider]  bisacodyl (DULCOLAX) 10 MG suppository Place 1 suppository (10 mg total) rectally daily as needed for moderate constipation.  04/18/16  Yes Hongalgi, Lenis Dickinson, MD  carvedilol (COREG) 3.125 MG tablet Take 1 tablet (3.125 mg total) by mouth 2 (two) times daily with a meal. 10/02/16  Yes Tillery, Satira Mccallum, PA-C  colchicine 0.6 MG tablet Take 1 tablet (0.6 mg total) by mouth daily as needed. Patient taking differently: Take 0.6 mg by mouth daily as needed (gout).  11/04/16  Yes Theodis Blaze, MD  diclofenac sodium (VOLTAREN) 1 % GEL Apply 4 g topically 2 (two) times daily as needed (pain). Apply to bilateral  knees    Yes [provider]  feeding supplement, GLUCERNA SHAKE, (GLUCERNA SHAKE) LIQD Take 118.5 mLs by  mouth daily. Reported on 08/21/2015   Yes [provider]  ferrous gluconate (FERGON) 324 MG tablet Take 1 tablet (324 mg total) by mouth 3 (three) times daily with meals. 11/16/16  Yes Short, Noah Delaine, MD  fluticasone (FLONASE) 50 MCG/ACT nasal spray Place 2 sprays into both nostrils daily. Reported on 05/02/2015   Yes [provider]  gabapentin (NEURONTIN) 100 MG capsule Take 2 capsules (200 mg total) by mouth 3 (three) times daily as needed (pain). Reported on 08/22/2015 10/17/16  Yes Rama, Venetia Maxon, MD  hydrALAZINE (APRESOLINE) 25 MG tablet Take 50 mg by mouth 3 (three) times daily.    Yes [provider]  hydrocerin (EUCERIN) CREA Apply Eucerin cream to BLE Q day after bathing and roughly towel drying to remove loose skin Patient taking differently: Apply 1 application topically daily as needed (dryness).  01/27/14  Yes Ghimire, Henreitta Leber, MD  HYDROcodone-acetaminophen (NORCO/VICODIN) 5-325 MG tablet Take 1-2 tablets by mouth every 6 (six) hours as needed for moderate pain. 11/16/16  Yes Short, Noah Delaine, MD  isosorbide mononitrate (IMDUR) 30 MG 24 hr tablet Take 30 mg by mouth 2 (two) times daily.   Yes [provider]  methocarbamol (ROBAXIN) 750 MG tablet Take 1 tablet (750 mg total) by mouth 3 (three) times daily. 10/17/16  Yes Rama, Venetia Maxon, MD  metolazone (ZAROXOLYN) 2.5 MG tablet Take 1 tablet (2.5 mg total) by mouth as directed. Patient taking differently: Take 2.5 mg by mouth daily.  10/23/16 10/23/17 Yes Larey Dresser, MD  OXYGEN Inhale 2 L into the lungs continuous.   Yes [provider]  potassium chloride SA (K-DUR,KLOR-CON) 20 MEQ tablet Take 20 mEq by mouth daily. Taken with metolazone.    Yes [provider]  simethicone (MYLICON) 80 MG chewable tablet Chew 80 mg by mouth every 6 (six) hours as needed for flatulence.   Yes [provider]  torsemide (DEMADEX) 20 MG tablet Take 4 tablets (80 mg total) by mouth daily. 10/23/16  01/21/17 Yes Larey Dresser, MD  amoxicillin-clavulanate (AUGMENTIN) 500-125 MG tablet Take 1 tablet (500 mg total) by mouth every 12 (twelve) hours. Patient not taking: Reported on 12/20/2016 11/16/16   Janece Canterbury, MD    Physical Exam: Vitals:   12/20/16 1458 12/20/16 1500 12/20/16 1700 12/20/16 1730  BP: 131/79 (!) 144/78 (!) 148/83 (!) 150/80  Pulse: 79 69 74 76  Resp: 20  18 18   Temp:      TempSrc:      SpO2: 97% 100% 100% 98%      Constitutional: NAD, calm, comfortable Vitals:   12/20/16 1458 12/20/16 1500 12/20/16 1700 12/20/16 1730  BP: 131/79 (!) 144/78 (!) 148/83 (!) 150/80  Pulse: 79 69 74 76  Resp: 20  18 18   Temp:      TempSrc:  SpO2: 97% 100% 100% 98%   Eyes: PERRL, l ENMT: Mucous membranes are moist.  Neck: normal, supple, no masses, no thyromegaly Respiratory: clear to auscultation bilaterally, no wheezing, no crackles. Normal respiratory effort. No accessory muscle use.  Cardiovascular: Regular rate and rhythm, no murmurs / rubs / gallops. No extremity edema.  Abdomen: no tenderness, . Bowel sounds positive. Soft. Nondistended Musculoskeletal: no clubbing / cyanosis. No joint deformity upper and lower extremities.  Skin: no rashes, lesions, ulcers. No induration Neurologic: Alert awake and following commands. Muscle strength 5 over 5 in all extremities Psychiatric:  Normal mood and affect.   Labs on Admission: I have personally reviewed following labs and imaging studies  CBC:  Recent Labs Lab 72/09/47 0962  WBC DUPLICATE  9.4  NEUTROABS 5.0  HGB DUPLICATE  8.5*  HCT DUPLICATE  83.6*  MCV DUPLICATE  62.9  PLT DUPLICATE  476   Basic Metabolic Panel:  Recent Labs Lab 12/20/16 0613  NA 140  K 4.0  CL 110  CO2 24  GLUCOSE 101*  BUN 26*  CREATININE 0.92  CALCIUM 9.5   GFR: CrCl cannot be calculated (Unknown ideal weight.). Liver Function Tests: No results for input(s): AST, ALT, ALKPHOS, BILITOT, PROT, ALBUMIN in the last  168 hours. No results for input(s): LIPASE, AMYLASE in the last 168 hours. No results for input(s): AMMONIA in the last 168 hours. Coagulation Profile: No results for input(s): INR, PROTIME in the last 168 hours. Cardiac Enzymes:  Recent Labs Lab 12/20/16 0613  CKTOTAL 10*   BNP (last 3 results) No results for input(s): PROBNP in the last 8760 hours. HbA1C: No results for input(s): HGBA1C in the last 72 hours. CBG: No results for input(s): GLUCAP in the last 168 hours. Lipid Profile: No results for input(s): CHOL, HDL, LDLCALC, TRIG, CHOLHDL, LDLDIRECT in the last 72 hours. Thyroid Function Tests: No results for input(s): TSH, T4TOTAL, FREET4, T3FREE, THYROIDAB in the last 72 hours. Anemia Panel: No results for input(s): VITAMINB12, FOLATE, FERRITIN, TIBC, IRON, RETICCTPCT in the last 72 hours. Urine analysis:    Component Value Date/Time   COLORURINE YELLOW 12/20/2016 0703   APPEARANCEUR CLEAR 12/20/2016 0703   LABSPEC 1.020 12/20/2016 0703   PHURINE 6.0 12/20/2016 0703   GLUCOSEU NEGATIVE 12/20/2016 0703   GLUCOSEU NEGATIVE 05/02/2015 1008   HGBUR TRACE (A) 12/20/2016 0703   BILIRUBINUR NEGATIVE 12/20/2016 0703   KETONESUR NEGATIVE 12/20/2016 0703   PROTEINUR >300 (A) 12/20/2016 0703   UROBILINOGEN 0.2 05/02/2015 1008   NITRITE NEGATIVE 12/20/2016 0703   LEUKOCYTESUR MODERATE (A) 12/20/2016 0703   Sepsis Labs: !!!!!!!!!!!!!!!!!!!!!!!!!!!!!!!!!!!!!!!!!!!! @LABRCNTIP (procalcitonin:4,lacticidven:4) )No results found for this or any previous visit (from the past 240 hour(s)).   Radiological Exams on Admission: Dg Lumbar Spine Complete  Result Date: 12/20/2016 CLINICAL DATA:  R flank pain with chills and dysuria since yesterday; EMS reports tender to palpation Mild lower back pain - mostly right flank says patient x 2 days. No injury. EXAM: LUMBAR SPINE - COMPLETE 4+ VIEW COMPARISON:  CT abdomen dated 04/14/2016. FINDINGS: Lumbar spine is diffusely sclerotic, similar to  the appearance on earlier CT. No fracture line or displaced fracture fragment identified. Mild disc desiccations at L4-5 and L5-S1. Visualized paravertebral soft tissues are unremarkable. IMPRESSION: 1. Osseous structures, lumbar spine and visualized portions of the osseous pelvis, are diffusely sclerotic, similar to the appearance on earlier CT, presumably diffuse metastatic disease (known primary cancer?). 2. No acute findings.  No osseous fracture or dislocation. Electronically Signed   By:  Franki Cabot M.D.   On: 12/20/2016 16:03   Dg Hip Unilat W Or Wo Pelvis 2-3 Views Right  Result Date: 12/20/2016 CLINICAL DATA:  R flank pain with chills and dysuria since yesterday; EMS reports tender to palpation Mild lower back pain - mostly right flank says patient x 2 days. No injury. EXAM: DG HIP (WITH OR WITHOUT PELVIS) 2-3V RIGHT COMPARISON:  CT abdomen dated 04/14/2016. FINDINGS: Osseous structures of the pelvis and proximal femurs are diffusely sclerotic, similar to the appearance on earlier CT. No fracture line or displaced fracture fragment identified. No significant degenerative change at either hip joint or within the lower lumbar spine. Soft tissues about the pelvis and right hip are unremarkable. IMPRESSION: 1. Osseous structures of the pelvis and right hip are diffusely sclerotic, similar to the appearance on earlier CT, presumably osseous metastasis (Known primary cancer?). 2. No acute findings.  No osseous fracture or dislocation seen. Electronically Signed   By: Franki Cabot M.D.   On: 12/20/2016 16:05    Assessment/Plan Active Problems:   UTI (urinary tract infection)   Hip pain  # Right hip and flank pain concerning for osseous metastasis versus right pyelonephritis in the setting of flank pain, mild fever, nausea vomiting and dysuria. -UA consistent with possible UTI, send urine culture. Treat with IV ceftriaxone pending culture results. The patient has no leukocytosis, lactate  normal. -Patient has known myeloproliferative disease. On reviewing prior medical records, patient was evaluated by oncologist at that time patient did not get further workup and no follow-ups. ER is consulting oncologist. Follow up with oncologist for further workup and evaluation. -I will check CT scan of abdomen and pelvis for further evaluation. -Check x-ray because of temperature -Continue supportive care including Tylenol  #Hypertension: Resume home medication. Monitor blood pressure closely.  #History of gout: No fair. Continue colchicine.  #Chronic anemia due to iron deficiency and chronic disease: May have been contributed by known myeloproliferative disease. Continue iron. Monitor hemoglobin.  #History of chronic diastolic congestive heart failure: Continue Imdur, hydralazine, diuretics. Patient looks euvolemic on exam. Follow-up chest x-ray.  #Hyperlipidemia: Continue statin  #History of coronary artery disease: Continue aspirin, Lipitor, Coreg and cardiac medication. Patient has no chest pain.  #Chronic respiratory failure with hypoxia: Continue 2 L of oxygen.  #Likely physical deconditioning due to multiple comorbidities: Consulted PT, OT, social worker and case management for safe discharge planning.  DVT prophylaxis: Lovenox subcutaneous Code Status: Full code Family Communication: No family at bedside Disposition Plan: Likely discharge home in 1-2 days Consults called: ER is going to call oncologist Admission status: Observation   Terrie Grajales Tanna Furry MD [facility] Pager [phone]  If 7PM-7AM, please contact night-coverage [email or web address] Password [facility]  12/20/2016, 5:39 PM

## 2016-12-20 NOTE — ED Provider Notes (Signed)
[place] [facility] DEPT Provider Note   CSN: [id number] Arrival date & time: 12/20/16  0600     History   Chief Complaint Chief Complaint  Patient presents with  . Flank Pain  . Chills  . Dysuria    HPI Juan [name] is a 76 y.o. 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 He presents emergency department for chills and back pain. The patient is a very poor historian, which limits the history of present illness. Patient states that he began feeling poorly yesterday. He complains of burning and stinging in his skin "all over." He states it is worse in his right lower flank and up by his right shoulder blade. He denies any current urinary symptoms. He states that the pain is worse to touch. He has not had any rashes that he knows of. He states that he thinks he may have been running a fever. He denies abdominal pain, nausea, vomiting, chest pain, shortness of breath.  HPI  Past Medical History:  Diagnosis Date  . Allergic rhinitis   . Anemia   . Ascending aortic aneurysm ([place]) 03/2014   4.3cm on CT scan  . CAD (coronary artery disease)    dx elsewheer in past, no documentation. Non-ischemic myoview 2007  . Chronic diastolic CHF (congestive heart failure), NYHA class 2 (HCC)    Normal EF w/ grade 1 dd by echo 12/2015  . Edema    R>L leg, u/s 5-12 neg for DVT  . Hemorrhoid   . History of thrombocytosis   . Hypertension   . Migraine    "once/wk at least" (07/11/2013)  . Myeloproliferative disease ([place])   . Shortness of breath   . Sinus congestion   . Sleep apnea, obstructive    at some point used CPAP, was d/c  years ago  . Type II diabetes mellitus [facility])     Patient Active Problem List   Diagnosis Date Noted  . Muscle spasm 11/16/2016  . Strep throat 11/16/2016  . Neck pain 11/14/2016  . Elevated troponin 11/14/2016  . Arthritis   . Recurrent pneumonia 10/31/2016  . Acute extremity pain  10/31/2016  . HCAP (healthcare-associated pneumonia) 10/31/2016  . 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 Pulmonary hypertension ([place])   . Sprain of left rotator cuff capsule   . Hyperkalemia   . Acute kidney injury superimposed on CKD ([place])   . Fever 10/08/2016  . Left arm pain 10/08/2016  . Paroxysmal SVT (supraventricular tachycardia) ([place]) 04/20/2016  . Type II diabetes mellitus ([place]) 04/20/2016  . Gout 04/20/2016  . Hyperuricemia 04/20/2016  . B12 deficiency 04/20/2016  . 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 Hydronephrosis   . Thrombocytosis ([place]) 04/07/2016  . Anemia 04/07/2016  . Community acquired pneumonia 04/01/2016  . Pneumonia 04/01/2016  . Pressure injury of skin 04/01/2016  . Pressure ulcer stage II 12/31/2015  . UTI (urinary tract infection) 12/30/2015  . Sepsis ([place]) 12/29/2015  . UTI (lower urinary tract infection) 12/29/2015  . T wave inversion in EKG 12/29/2015  . PCP NOTES >>>>>>>>>>>>>>>>> 05/02/2015  . Arm pain   . Peripheral edema   . Unable to ambulate   . Thoracic aortic aneurysm ([place]) 04/26/2014  . CKD (chronic kidney disease) stage 3, GFR 30-59 ml/min 04/26/2014  . 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 Chest pain 04/15/2014  . Encounter for palliative care 04/06/2014  . Generalized weakness 04/06/2014  .  Venous stasis dermatitis of both lower extremities   . Malnutrition of moderate degree ([place]) 03/31/2014  . Morbid obesity due to excess calories ([place]) 03/29/2014  . 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 Poor compliance with advise  05/05/2012  . Annual physical exam 01/14/2012  . Weight loss 01/14/2012  . BPH (benign prostatic hyperplasia) 01/14/2012  . *Chronic lower extremity edema, stasis dermatitis 06/09/2011  . CARDIOMEGALY 04/29/2009  . *Prediabetes 09/07/2006  . Symptomatic anemia 09/07/2006  . Myeloproliferative disease ([place]) 09/07/2006  . Obstructive sleep apnea 09/07/2006  . Essential  hypertension 09/07/2006  . Coronary atherosclerosis 09/07/2006  . Chronic diastolic (congestive) heart failure ([place]) 09/07/2006  . Allergic rhinitis 09/07/2006    Past Surgical History:  Procedure Laterality Date  . TOE SURGERY Right    "tried to straighten out big toe" (07/11/2013)       Home Medications    Prior to Admission medications   Medication Sig Start Date End Date Taking? Authorizing Provider  acetaminophen (TYLENOL) 325 MG tablet Take 2 tablets (650 mg total) by mouth every 6 (six) hours as needed for mild pain, fever or headache. 11/16/16   Janece Canterbury, MD  amoxicillin-clavulanate (AUGMENTIN) 500-125 MG tablet Take 1 tablet (500 mg total) by mouth every 12 (twelve) hours. 11/16/16   Janece Canterbury, MD  aspirin 325 MG tablet Take 325 mg by mouth daily.    [provider]  atorvastatin (LIPITOR) 40 MG tablet Take 1 tablet (40 mg total) by mouth daily. 11/16/16 11/16/17  Janece Canterbury, MD  B Complex Vitamins (VITAMIN B COMPLEX PO) Take 100 mg by mouth daily.    [provider]  bisacodyl (DULCOLAX) 10 MG suppository Place 1 suppository (10 mg total) rectally daily as needed for moderate constipation. 04/18/16   Modena Jansky, MD  carvedilol (COREG) 3.125 MG tablet Take 1 tablet (3.125 mg total) by mouth 2 (two) times daily with a meal. 10/02/16   Tillery, Satira Mccallum, PA-C  colchicine 0.6 MG tablet Take 1 tablet (0.6 mg total) by mouth daily as needed. 11/04/16   Theodis Blaze, MD  diclofenac sodium (VOLTAREN) 1 % GEL Apply 4 g topically 2 (two) times daily as needed (pain). Apply to bilateral  knees     [provider]  feeding supplement, GLUCERNA SHAKE, (GLUCERNA SHAKE) LIQD Take 118.5 mLs by mouth daily. Reported on 08/21/2015    [provider]  ferrous gluconate (FERGON) 324 MG tablet Take 1 tablet (324 mg total) by mouth 3 (three) times daily with meals. 11/16/16   Janece Canterbury, MD  fluticasone (FLONASE) 50 MCG/ACT nasal spray  Place 2 sprays into both nostrils daily. Reported on 05/02/2015    [provider]  gabapentin (NEURONTIN) 100 MG capsule Take 2 capsules (200 mg total) by mouth 3 (three) times daily as needed (pain). Reported on 08/22/2015 10/17/16   Rama, Venetia Maxon, MD  hydrALAZINE (APRESOLINE) 25 MG tablet Take 25 mg by mouth 3 (three) times daily.     [provider]  hydrocerin (EUCERIN) CREA Apply Eucerin cream to BLE Q day after bathing and roughly towel drying to remove loose skin Patient taking differently: Apply 1 application topically daily as needed (dryness).  01/27/14   Ghimire, Henreitta Leber, MD  HYDROcodone-acetaminophen (NORCO/VICODIN) 5-325 MG tablet Take 1-2 tablets by mouth every 6 (six) hours as needed for moderate pain. 11/16/16   Janece Canterbury, MD  isosorbide mononitrate (IMDUR) 30 MG 24 hr tablet Take 30 mg by mouth 2 (two) times  daily.    [provider]  methocarbamol (ROBAXIN) 750 MG tablet Take 1 tablet (750 mg total) by mouth 3 (three) times daily. 10/17/16   Rama, Venetia Maxon, MD  metolazone (ZAROXOLYN) 2.5 MG tablet Take 1 tablet (2.5 mg total) by mouth as directed. Patient taking differently: Take 2.5 mg by mouth daily.  10/23/16 10/23/17  Larey Dresser, MD  OXYGEN Inhale 2 L into the lungs continuous.    [provider]  potassium chloride SA (K-DUR,KLOR-CON) 20 MEQ tablet Take 20 mEq by mouth daily. Taken with metolazone.     [provider]  simethicone (MYLICON) 80 MG chewable tablet Chew 80 mg by mouth every 6 (six) hours as needed for flatulence.    [provider]  torsemide (DEMADEX) 20 MG tablet Take 4 tablets (80 mg total) by mouth daily. 10/23/16 01/21/17  Larey Dresser, MD    Family History Family History  Problem Relation Age of Onset  . Schizophrenia Son   . Colon cancer Neg Hx   . Prostate cancer Neg Hx   . Heart attack Neg Hx   . Diabetes Neg Hx     Social History Social History  Substance Use Topics  . Smoking  status: Former Smoker    Packs/day: 0.25    Years: 12.00    Types: Cigarettes    Quit date: 05/18/1966  . Smokeless tobacco: Never Used     Comment: quit smoking 45 years ago  . Alcohol use No     Allergies   Patient has no known allergies.   Review of Systems Review of Systems Ten systems reviewed and are negative for acute change, except as noted in the HPI.   Physical Exam Updated Vital Signs BP (!) 182/94   Pulse 91   Temp 98.9 F (37.2 C) (Oral)   Resp 18   SpO2 95%   Physical Exam  Constitutional: He appears well-developed and well-nourished. He has a sickly appearance. No distress.  HENT:  Head: Normocephalic and atraumatic.  Eyes: Conjunctivae are normal. No scleral icterus.  Neck: Normal range of motion. Neck supple.  Cardiovascular: Normal rate, regular rhythm and normal heart sounds.   Pulmonary/Chest: Effort normal and breath sounds normal. No respiratory distress.  Abdominal: Soft. There is no tenderness.  Musculoskeletal: He exhibits no edema.  Neurological: He is alert.  Skin: Skin is warm, dry and intact. No rash noted. He is not diaphoretic.  Psychiatric: His behavior is normal.  Nursing note and vitals reviewed.    ED Treatments / Results  Labs (all labs ordered are listed, but only abnormal results are displayed) Labs Reviewed  BASIC METABOLIC PANEL - Abnormal; Notable for the following:       Result Value   Glucose, Bld 101 (*)    BUN 26 (*)    All other components within normal limits  CBC - Abnormal; Notable for the following:    RBC 3.37 (*)    Hemoglobin 8.5 (*)    HCT 28.0 (*)    MCH 25.2 (*)    RDW 20.4 (*)    All other components within normal limits  CULTURE, BLOOD (ROUTINE X 2)  CULTURE, BLOOD (ROUTINE X 2)  URINALYSIS, ROUTINE W REFLEX MICROSCOPIC  I-STAT CG4 LACTIC ACID, ED    EKG  EKG Interpretation None       Radiology No results found.  Procedures Procedures (including critical care time)  Medications  Ordered in ED Medications  acetaminophen (TYLENOL) tablet 650 mg (  not administered)  diphenhydrAMINE (BENADRYL) injection 12.5 mg (12.5 mg Intravenous Given 12/20/16 0704)     Initial Impression / Assessment and Plan / ED Course  I have reviewed the triage vital signs and the nursing notes.  Pertinent labs & imaging results that were available during my care of the patient were reviewed by me and considered in my medical decision making (see chart for details).  Clinical Course as of Dec 23 1216  Nancy Fetter Dec 20, 2016  1143 Patient refusing to ambulate because of his back pain. Will treat pain and reassess.  [AH]  1639 Patient with uncontrolled pain. Unable to ambulate after multiple attempts to treat his pain. I did order a x-ray of his hip and lumbar spine. He appears to have metastatic lesions that weren't noticed previously on an abdominal CT scan in November 2018. Review of patient's chart shows that he has saw Dr. 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 There is multiple areas of conversation documenting that the patient at that time did not want any further workup or treatment. I discussed the findings on his x-ray today. The patient states that he does not remember saying he did not want treatment. He is concerned about the lesions and would like further workup, especially as he is in significant pain and unable to ambulate on the leg. I placed a call for consult to both oncology and admitting services.  [AH]    Clinical Course User Index [AH] Margarita Mail, PA-C      Final Clinical Impressions(s) / ED Diagnoses   Final diagnoses:  Pain of right hip joint  Fever, unspecified fever cause    New Prescriptions New Prescriptions   No medications on file     Margarita Mail, PA-C 12/23/16 1218    Daleen Bo, MD 12/23/16 1254

## 2016-12-20 NOTE — ED Notes (Signed)
Dinner order placed 

## 2016-12-20 NOTE — ED Notes (Signed)
Patient transported to X-ray 

## 2016-12-20 NOTE — ED Provider Notes (Signed)
  Face-to-face evaluation   History: He complains of right side pain, general achiness and "fever." A reports having "chills." He denies cough, vomiting, dizziness.  Physical exam: Alert, elderly man who is somewhat uncomfortable. Heart regular rate and rhythm, no murmur. Lungs clear anteriorly. Abdomen soft, nontender. He has pain in the right lateral abdomen with movement of the right leg.  Medical screening examination/treatment/procedure(s) were conducted as a shared visit with non-physician practitioner(s) and myself.  I personally evaluated the patient during the encounter   Daleen Bo, MD 12/23/16 1254

## 2016-12-20 NOTE — ED Notes (Addendum)
Multiple attempts to get pt to ambulate, pt refuses each time.

## 2016-12-20 NOTE — ED Triage Notes (Signed)
Pt presents from home with [facility] for R flank pain with chills and dysuria since yesterday; EMS reports tender to palpation

## 2016-12-20 NOTE — ED Notes (Signed)
Pt having lunch

## 2016-12-20 NOTE — Progress Notes (Signed)
   12/20/16 0650  Clinical Encounter Type  Visited With Patient  Visit Type ED  Spiritual Encounters  Spiritual Needs Prayer  Stress Factors  Patient Stress Factors Health changes  Paged for prayer request. Introduction to Pt. Offered prayer of comfort and hope.

## 2016-12-21 DIAGNOSIS — I130 Hypertensive heart and chronic kidney disease with heart failure and stage 1 through stage 4 chronic kidney disease, or unspecified chronic kidney disease: Secondary | ICD-10-CM | POA: Diagnosis present

## 2016-12-21 DIAGNOSIS — E785 Hyperlipidemia, unspecified: Secondary | ICD-10-CM | POA: Diagnosis present

## 2016-12-21 DIAGNOSIS — R161 Splenomegaly, not elsewhere classified: Secondary | ICD-10-CM | POA: Diagnosis present

## 2016-12-21 DIAGNOSIS — D7589 Other specified diseases of blood and blood-forming organs: Secondary | ICD-10-CM | POA: Diagnosis not present

## 2016-12-21 DIAGNOSIS — G4733 Obstructive sleep apnea (adult) (pediatric): Secondary | ICD-10-CM | POA: Diagnosis present

## 2016-12-21 DIAGNOSIS — I2510 Atherosclerotic heart disease of native coronary artery without angina pectoris: Secondary | ICD-10-CM | POA: Diagnosis present

## 2016-12-21 DIAGNOSIS — C3490 Malignant neoplasm of unspecified part of unspecified bronchus or lung: Secondary | ICD-10-CM | POA: Diagnosis not present

## 2016-12-21 DIAGNOSIS — D519 Vitamin B12 deficiency anemia, unspecified: Secondary | ICD-10-CM | POA: Diagnosis present

## 2016-12-21 DIAGNOSIS — N401 Enlarged prostate with lower urinary tract symptoms: Secondary | ICD-10-CM | POA: Diagnosis not present

## 2016-12-21 DIAGNOSIS — Z9981 Dependence on supplemental oxygen: Secondary | ICD-10-CM | POA: Diagnosis not present

## 2016-12-21 DIAGNOSIS — N189 Chronic kidney disease, unspecified: Secondary | ICD-10-CM | POA: Diagnosis not present

## 2016-12-21 DIAGNOSIS — D509 Iron deficiency anemia, unspecified: Secondary | ICD-10-CM | POA: Diagnosis not present

## 2016-12-21 DIAGNOSIS — D471 Chronic myeloproliferative disease: Secondary | ICD-10-CM | POA: Diagnosis not present

## 2016-12-21 DIAGNOSIS — M109 Gout, unspecified: Secondary | ICD-10-CM | POA: Diagnosis present

## 2016-12-21 DIAGNOSIS — D649 Anemia, unspecified: Secondary | ICD-10-CM | POA: Diagnosis not present

## 2016-12-21 DIAGNOSIS — S199XXA Unspecified injury of neck, initial encounter: Secondary | ICD-10-CM | POA: Diagnosis not present

## 2016-12-21 DIAGNOSIS — I5032 Chronic diastolic (congestive) heart failure: Secondary | ICD-10-CM | POA: Diagnosis not present

## 2016-12-21 DIAGNOSIS — E1122 Type 2 diabetes mellitus with diabetic chronic kidney disease: Secondary | ICD-10-CM | POA: Diagnosis present

## 2016-12-21 DIAGNOSIS — R509 Fever, unspecified: Secondary | ICD-10-CM | POA: Diagnosis not present

## 2016-12-21 DIAGNOSIS — R339 Retention of urine, unspecified: Secondary | ICD-10-CM | POA: Diagnosis present

## 2016-12-21 DIAGNOSIS — Z7982 Long term (current) use of aspirin: Secondary | ICD-10-CM | POA: Diagnosis not present

## 2016-12-21 DIAGNOSIS — R109 Unspecified abdominal pain: Secondary | ICD-10-CM | POA: Diagnosis not present

## 2016-12-21 DIAGNOSIS — Z79899 Other long term (current) drug therapy: Secondary | ICD-10-CM | POA: Diagnosis not present

## 2016-12-21 DIAGNOSIS — R0989 Other specified symptoms and signs involving the circulatory and respiratory systems: Secondary | ICD-10-CM | POA: Diagnosis present

## 2016-12-21 DIAGNOSIS — I10 Essential (primary) hypertension: Secondary | ICD-10-CM | POA: Diagnosis not present

## 2016-12-21 DIAGNOSIS — I5040 Unspecified combined systolic (congestive) and diastolic (congestive) heart failure: Secondary | ICD-10-CM | POA: Diagnosis not present

## 2016-12-21 DIAGNOSIS — N1330 Unspecified hydronephrosis: Secondary | ICD-10-CM | POA: Diagnosis not present

## 2016-12-21 DIAGNOSIS — M810 Age-related osteoporosis without current pathological fracture: Secondary | ICD-10-CM | POA: Diagnosis present

## 2016-12-21 DIAGNOSIS — M25551 Pain in right hip: Secondary | ICD-10-CM | POA: Diagnosis not present

## 2016-12-21 DIAGNOSIS — J989 Respiratory disorder, unspecified: Secondary | ICD-10-CM | POA: Diagnosis not present

## 2016-12-21 DIAGNOSIS — N136 Pyonephrosis: Secondary | ICD-10-CM | POA: Diagnosis present

## 2016-12-21 DIAGNOSIS — N3000 Acute cystitis without hematuria: Secondary | ICD-10-CM | POA: Diagnosis not present

## 2016-12-21 DIAGNOSIS — N183 Chronic kidney disease, stage 3 (moderate): Secondary | ICD-10-CM | POA: Diagnosis not present

## 2016-12-21 DIAGNOSIS — J9611 Chronic respiratory failure with hypoxia: Secondary | ICD-10-CM | POA: Diagnosis present

## 2016-12-21 DIAGNOSIS — N179 Acute kidney failure, unspecified: Secondary | ICD-10-CM | POA: Diagnosis not present

## 2016-12-21 DIAGNOSIS — Z7409 Other reduced mobility: Secondary | ICD-10-CM | POA: Diagnosis not present

## 2016-12-21 DIAGNOSIS — R338 Other retention of urine: Secondary | ICD-10-CM | POA: Diagnosis not present

## 2016-12-21 DIAGNOSIS — Z87891 Personal history of nicotine dependence: Secondary | ICD-10-CM | POA: Diagnosis not present

## 2016-12-21 LAB — BASIC METABOLIC PANEL
Anion gap: 5 (ref 5–15)
BUN: 27 mg/dL — AB (ref 6–20)
CO2: 24 mmol/L (ref 22–32)
CREATININE: 1.16 mg/dL (ref 0.61–1.24)
Calcium: 9.2 mg/dL (ref 8.9–10.3)
Chloride: 110 mmol/L (ref 101–111)
GFR calc Af Amer: >60 mL/min (ref >60)
GFR, EST NON AFRICAN AMERICAN: 60 mL/min — AB (ref >60)
Glucose, Bld: 95 mg/dL (ref 65–99)
Potassium: 4.7 mmol/L (ref 3.5–5.1)
SODIUM: 139 mmol/L (ref 135–145)

## 2016-12-21 LAB — CBC
HCT: 26.8 % — ABNORMAL LOW (ref 39.0–52.0)
Hemoglobin: 8 g/dL — ABNORMAL LOW (ref 13.0–17.0)
MCH: 24.9 pg — ABNORMAL LOW (ref 26.0–34.0)
MCHC: 29.9 g/dL — ABNORMAL LOW (ref 30.0–36.0)
MCV: 83.5 fL (ref 78.0–100.0)
PLATELETS: 329 10*3/uL (ref 150–400)
RBC: 3.21 MIL/uL — ABNORMAL LOW (ref 4.22–5.81)
RDW: 20.4 % — AB (ref 11.5–15.5)
WBC: 8.4 10*3/uL (ref 4.0–10.5)

## 2016-12-21 LAB — PSA: Prostatic Specific Antigen: 0.44 ng/mL (ref 0.00–4.00)

## 2016-12-21 MED ORDER — NYSTATIN-TRIAMCINOLONE 100000-0.1 UNIT/GM-% EX OINT
TOPICAL_OINTMENT | Freq: Three times a day (TID) | CUTANEOUS | Status: DC
  Administered 2016-12-21 – 2016-12-23 (×7): via TOPICAL
  Administered 2016-12-23: 1 via TOPICAL
  Administered 2016-12-24: 10:00:00 via TOPICAL
  Filled 2016-12-21 (×2): qty 15

## 2016-12-21 MED ORDER — TAMSULOSIN HCL 0.4 MG PO CAPS
0.4000 mg | ORAL_CAPSULE | Freq: Every day | ORAL | Status: DC
  Administered 2016-12-21 – 2016-12-24 (×4): 0.4 mg via ORAL
  Filled 2016-12-21 (×4): qty 1

## 2016-12-21 NOTE — Consult Note (Signed)
Subjective: CC: Urinary retention.  Hx:  I was asked to see Juan [name] in consultation by Dr. Carolin Sicks for urinary retention with bilateral hydro.  Juan [name] has had prior AUR and saw Dr. Louis Meckel in 10/17.   He presented with a 5 day history of difficulty voiding.   He had no hematuria or abdominal pain and only mild dysuria.   He has had some right flank discomfort.  He has had no fever.   A CT scan showed a markedly dilated bladder with several large diverticuli and bilateral hydro.  The prostate was not very large.   A foley will be placed today.   His Cr was 1.16.  UA had 6-30 WBC  And RBC and a culture is pending.  He has had no prior GU surgery.  ROS:  Review of Systems  Genitourinary: Positive for dysuria and flank pain.  All other systems reviewed and are negative.   No Known Allergies  Past Medical History:  Diagnosis Date  . Allergic rhinitis   . Anemia   . Ascending aortic aneurysm ([place]) 03/2014   4.3cm on CT scan  . CAD (coronary artery disease)    dx elsewheer in past, no documentation. Non-ischemic myoview 2007  . Chronic diastolic CHF (congestive heart failure), NYHA class 2 (HCC)    Normal EF w/ grade 1 dd by echo 12/2015  . Edema    R>L leg, u/s 5-12 neg for DVT  . Hemorrhoid   . History of thrombocytosis   . Hypertension   . Migraine    "once/wk at least" (07/11/2013)  . Myeloproliferative disease ([place])   . Shortness of breath   . Sinus congestion   . Sleep apnea, obstructive    at some point used CPAP, was d/c  years ago  . Type II diabetes mellitus ([place])     Past Surgical History:  Procedure Laterality Date  . TOE SURGERY Right    "tried to straighten out big toe" (07/11/2013)    Social History   Social History  . Marital status: Widowed    Spouse name: N/A  . Number of children: 2  . Years of education: N/A   Occupational History  . retired    .  Retired   Social History Main Topics  . Smoking status: Former Smoker    Packs/day: 0.25     Years: 12.00    Types: Cigarettes    Quit date: 05/18/1966  . Smokeless tobacco: Never Used     Comment: quit smoking 45 years ago  . Alcohol use No  . Drug use: No  . Sexual activity: Yes   Other Topics Concern  . Not on file   Social History Narrative   Moved from [place] 2006   Widow 2007   Son lives with him     Family History  Problem Relation Age of Onset  . Schizophrenia Son   . Colon cancer Neg Hx   . Prostate cancer Neg Hx   . Heart attack Neg Hx   . Diabetes Neg Hx     Anti-infectives: Anti-infectives    Start     Dose/Rate Route Frequency Ordered Stop   12/20/16 1900  cefTRIAXone (ROCEPHIN) 1 g in dextrose 5 % 50 mL IVPB     1 g 100 mL/hr over 30 Minutes Intravenous Every 24 hours 12/20/16 1736        Current Facility-Administered Medications  Medication Dose Route Frequency Provider Last Rate Last Dose  . acetaminophen (TYLENOL)  tablet 650 mg  650 mg Oral Q6H PRN Rosita Fire, MD      . aspirin tablet 325 mg  325 mg Oral Daily Rosita Fire, MD   325 mg at 12/21/16 1016  . atorvastatin (LIPITOR) tablet 40 mg  40 mg Oral Daily Rosita Fire, MD   40 mg at 12/21/16 1015  . B-complex with vitamin C tablet 1 tablet  1 tablet Oral Daily Rosita Fire, MD   1 tablet at 12/21/16 1016  . bisacodyl (DULCOLAX) suppository 10 mg  10 mg Rectal Daily PRN Rosita Fire, MD      . carvedilol (COREG) tablet 3.125 mg  3.125 mg Oral BID WC Rosita Fire, MD   3.125 mg at 12/21/16 1014  . cefTRIAXone (ROCEPHIN) 1 g in dextrose 5 % 50 mL IVPB  1 g Intravenous Q24H Rosita Fire, MD   Stopped at 12/21/16 0130  . colchicine tablet 0.6 mg  0.6 mg Oral Daily Rosita Fire, MD   0.6 mg at 12/21/16 1016  . diclofenac sodium (VOLTAREN) 1 % transdermal gel 4 g  4 g Topical BID PRN Rosita Fire, MD      . enoxaparin (LOVENOX) injection 40 mg  40 mg Subcutaneous Q24H Rosita Fire, MD   40 mg at 12/20/16 2337  .  feeding supplement (GLUCERNA SHAKE) (GLUCERNA SHAKE) liquid 118.5 mL  118.5 mL Oral Daily Rosita Fire, MD   118.5 mL at 12/21/16 1026  . ferrous gluconate (FERGON) tablet 324 mg  324 mg Oral BID WC Rosita Fire, MD   324 mg at 12/21/16 1016  . fluticasone (FLONASE) 50 MCG/ACT nasal spray 2 spray  2 spray Each Nare Daily Rosita Fire, MD   2 spray at 12/21/16 1018  . gabapentin (NEURONTIN) capsule 200 mg  200 mg Oral BID Rosita Fire, MD   200 mg at 12/21/16 1015  . hydrALAZINE (APRESOLINE) tablet 50 mg  50 mg Oral TID Rosita Fire, MD   50 mg at 12/21/16 1016  . HYDROcodone-acetaminophen (NORCO/VICODIN) 5-325 MG per tablet 1-2 tablet  1-2 tablet Oral Q6H PRN Rosita Fire, MD   2 tablet at 12/21/16 1029  . isosorbide mononitrate (IMDUR) 24 hr tablet 30 mg  30 mg Oral BID Rosita Fire, MD   30 mg at 12/21/16 1017  . methocarbamol (ROBAXIN) tablet 750 mg  750 mg Oral TID Rosita Fire, MD   750 mg at 12/21/16 1017  . metolazone (ZAROXOLYN) tablet 2.5 mg  2.5 mg Oral Daily Rosita Fire, MD   2.5 mg at 12/21/16 1015  . nystatin-triamcinolone ointment (MYCOLOG)   Topical TID Irine Seal, MD      . ondansetron [facility]) tablet 4 mg  4 mg Oral Q6H PRN Rosita Fire, MD       Or  . ondansetron [facility]) injection 4 mg  4 mg Intravenous Q6H PRN Rosita Fire, MD      . polyethylene glycol (MIRALAX / GLYCOLAX) packet 17 g  17 g Oral Daily PRN Rosita Fire, MD      . potassium chloride SA (K-DUR,KLOR-CON) CR tablet 20 mEq  20 mEq Oral Daily Rosita Fire, MD   20 mEq at 12/21/16 1017  . simethicone (MYLICON) chewable tablet 80 mg  80 mg Oral Q6H PRN Rosita Fire, MD      . tamsulosin [facility]) capsule 0.4 mg  0.4 mg Oral Daily  Rosita Fire, MD      . torsemide [facility]) tablet 80 mg  80 mg Oral Daily Rosita Fire, MD   80 mg at 12/21/16 1015     Objective: Vital signs in  last 24 hours: Temp:  [98.1 F (36.7 C)-98.4 F (36.9 C)] 98.3 F (36.8 C) (08/06 1310) Pulse Rate:  [62-91] 62 (08/06 1310) Resp:  [16-20] 20 (08/06 1310) BP: (108-154)/(59-85) 108/59 (08/06 1310) SpO2:  [97 %-100 %] 98 % (08/06 1310) Weight:  [90.5 kg (199 lb 9.6 oz)] 90.5 kg (199 lb 9.6 oz) (08/05 2030)  Intake/Output from previous day: 08/05 0701 - 08/06 0700 In: 77 [IV Piggyback:50] Out: 200 [Urine:200] Intake/Output this shift: Total I/O In: -  Out: 150 [Urine:150]   Physical Exam  Constitutional: He is oriented to person, place, and time and well-developed, well-nourished, and in no distress.  HENT:  Head: Normocephalic and atraumatic.  Neck: Normal range of motion. Neck supple. No thyromegaly present.  Cardiovascular: Normal rate, regular rhythm and normal heart sounds.   Pulmonary/Chest: Effort normal and breath sounds normal. No respiratory distress.  Abdominal: Soft. He exhibits no distension and no mass. There is no tenderness.  Genitourinary:  Genitourinary Comments: Normal phallus with a small ventral erythematous lesion on the distal shaft.    Scrotum normal with normal testes and epididymis. AP without lesions. Prostate 1-2+ and benign with non-palpable SV's.  Musculoskeletal: Normal range of motion. He exhibits no edema or tenderness.  Lymphadenopathy:    He has no cervical adenopathy.  Neurological: He is alert and oriented to person, place, and time.  Skin: Skin is warm and dry.  Psychiatric: Mood and affect normal.    Lab Results:   Recent Labs  12/20/16 0613 40/98/11 9147  WBC DUPLICATE  9.4 8.4  HGB DUPLICATE  8.5* 8.0*  HCT DUPLICATE  82.9* 56.2*  PLT DUPLICATE  130 865   BMET  Recent Labs  12/20/16 0613 12/21/16 0723  NA 140 139  K 4.0 4.7  CL 110 110  CO2 24 24  GLUCOSE 101* 95  BUN 26* 27*  CREATININE 0.92 1.16  CALCIUM 9.5 9.2   PT/INR No results for input(s): LABPROT, INR in the last 72 hours. ABG No results for  input(s): PHART, HCO3 in the last 72 hours.  Invalid input(s): PCO2, PO2  Studies/Results: Ct Abdomen Pelvis Wo Contrast  Result Date: 12/20/2016 CLINICAL DATA:  76 year old male with flank pain, nausea vomiting. Personal history of myeloproliferative disease. EXAM: CT ABDOMEN AND PELVIS WITHOUT CONTRAST TECHNIQUE: Multidetector CT imaging of the abdomen and pelvis was performed following the standard protocol without IV contrast. COMPARISON:  CT Abdomen and Pelvis 04/14/2016 FINDINGS: Lower chest: Chronic mosaic attenuation at the lung bases, favor due to gas trapping. Superimposed small bilateral layering pleural effusions, chronic elevation of the right hemidiaphragm, and chronic posterior basal segment right lower lobe atelectasis. Cardiomegaly. No pericardial effusion. Hepatobiliary: Chronic elevation the right hemidiaphragm. Negative noncontrast liver and gallbladder. Pancreas: Negative noncontrast pancreas. Spleen: Chronic splenomegaly is stable. Adrenals/Urinary Tract: Normal adrenal glands. Increased bilateral renal collecting system size compatible with mild to moderate hydronephrosis. Associated bilateral hydroureter with tortuous bilateral ureters. No urologic calculus identified, but the urinary bladder is both abnormally distended and multi loculated appearing, suggesting multiple large bladder diverticula. Overall urinary bladder volume is estimated at 1,100 mL. No gas within the urinary bladder. Stomach/Bowel: Negative rectum aside from retained stool. Redundant sigmoid colon with severe diverticulosis. Gas and stool in the transverse colon and  right colon. Oral contrast has reached the cecum. Normal appendix. Normal terminal ileum. No dilated small bowel. Negative stomach and duodenum. No definite abdominal free fluid. Vascular/Lymphatic: Vascular patency is not evaluated in the absence of IV contrast. Aortoiliac calcified atherosclerosis. No lymphadenopathy is evident. Reproductive:   Negative. Other: Mild presacral stranding.  No definite pelvic free fluid. Curvilinear area of abnormal soft tissue thickening posterior to the right sacrum is felt to be a skin fold rather than a decubitus wound. A similar finding was present in 2017 along the left side of the sacrum. Musculoskeletal: Chronic abnormal bone mineralization diffusely. Diffuse abnormal sclerosis. No superimposed acute osseous abnormality identified. IMPRESSION: 1. Moderate bilateral hydronephrosis and hydroureter appears secondary to urinary bladder distension / bladder outlet obstruction. Enlarged urinary bladder and multifocal large bladder diverticula. Estimated total bladder volume 1.1 Liters. 2. Severe diverticulosis of the splenic flexure, no definite acute bowel inflammation. 3. Chronic splenomegaly and abnormal bone mineralization, likely the sequelae of the chronic myeloproliferative disease. 4. Chronic small pleural effusions with lung base gas trapping and atelectasis. 5. Cardiomegaly.  Aortic Atherosclerosis (ICD10-I70.0). Electronically Signed   By: Genevie Ann M.D.   On: 12/20/2016 21:58   X-ray Chest Pa And Lateral  Result Date: 12/20/2016 CLINICAL DATA:  Fever today. No chest pain or SOB. Only right flank pain. Hx of CAD, CHF, HTN, type II diabetes mellitus. EXAM: CHEST  2 VIEW COMPARISON:  Chest x-rays dated 11/14/2016, 11/02/2016 and 05/05/2016. FINDINGS: Stable cardiomegaly. Overall cardiomediastinal silhouette is grossly stable. Central pulmonary vascular congestion and bilateral interstitial prominence, presumed edema, similar to previous exams. No confluent opacity to suggest a developing pneumonia. No pleural effusion or pneumothorax seen. Stable scoliosis.  No acute appearing osseous abnormality. IMPRESSION: 1. Cardiomegaly with central pulmonary vascular congestion and bilateral interstitial prominence, presumed interstitial edema, suggesting CHF/volume overload. This appearance is not significantly changed  compared to previous exams suggesting chronic CHF. 2. No evidence of pneumonia seen. Electronically Signed   By: Franki Cabot M.D.   On: 12/20/2016 18:31   Dg Lumbar Spine Complete  Result Date: 12/20/2016 CLINICAL DATA:  R flank pain with chills and dysuria since yesterday; EMS reports tender to palpation Mild lower back pain - mostly right flank says patient x 2 days. No injury. EXAM: LUMBAR SPINE - COMPLETE 4+ VIEW COMPARISON:  CT abdomen dated 04/14/2016. FINDINGS: Lumbar spine is diffusely sclerotic, similar to the appearance on earlier CT. No fracture line or displaced fracture fragment identified. Mild disc desiccations at L4-5 and L5-S1. Visualized paravertebral soft tissues are unremarkable. IMPRESSION: 1. Osseous structures, lumbar spine and visualized portions of the osseous pelvis, are diffusely sclerotic, similar to the appearance on earlier CT, presumably diffuse metastatic disease (known primary cancer?). 2. No acute findings.  No osseous fracture or dislocation. Electronically Signed   By: Franki Cabot M.D.   On: 12/20/2016 16:03   Dg Hip Unilat W Or Wo Pelvis 2-3 Views Right  Result Date: 12/20/2016 CLINICAL DATA:  R flank pain with chills and dysuria since yesterday; EMS reports tender to palpation Mild lower back pain - mostly right flank says patient x 2 days. No injury. EXAM: DG HIP (WITH OR WITHOUT PELVIS) 2-3V RIGHT COMPARISON:  CT abdomen dated 04/14/2016. FINDINGS: Osseous structures of the pelvis and proximal femurs are diffusely sclerotic, similar to the appearance on earlier CT. No fracture line or displaced fracture fragment identified. No significant degenerative change at either hip joint or within the lower lumbar spine. Soft tissues about the pelvis and  right hip are unremarkable. IMPRESSION: 1. Osseous structures of the pelvis and right hip are diffusely sclerotic, similar to the appearance on earlier CT, presumably osseous metastasis (Known primary cancer?). 2. No acute  findings.  No osseous fracture or dislocation seen. Electronically Signed   By: Franki Cabot M.D.   On: 12/20/2016 16:05   I have discussed the case with Dr. Carolin Sicks and reviewed the CT films and report and his labs.   Assessment: Acute urinary retention with bilateral hydro and possible UTI.    Agree with foley, tamsulosin and antibiotics pending the culture.   He will need f/u in our office in 1-2 weeks for possible cystoscopy and voiding trial.   Diffuse bony sclerosis on imaging.   Possible malignancy.  I will get a PSA.     CC: Dr. Lawson Radar     Juan [name] 12/21/2016 [id number]

## 2016-12-21 NOTE — Progress Notes (Signed)
PROGRESS NOTE [facility]   Hatem Cull   YHC:[id number] DOB: 12/17/1940  DOA: 12/20/2016 PCP: Colon Branch, MD   Brief Narrative:  76 yo male with PMH of CAD, diastolic CHF, myloproliferative disease without f/u with oncology, CKD III, BPH, chronic B12 and iron anemia, OSA/chronic hypoxemia on home oxygen, presented to the ED with worsening right hip pain and flank pain. He uses a cane at home and reports trouble walking from worsening hip pain and that urination is with effort and little urine is typically produced.   CT showed bilateral hydronephrosis and hydroureter due to obstructive uropathy; and pulmonary congestion. Urine showed possible UTI  He was admitted for possible metastatic disease, but is currently being treated for obstructive uropathy and UTI.  Subjective: He is laying in bed comfortable with 2 L [place] on. Complaining of flank and abdominal pain. States that he was able to pee little with effort with no discomfort or pain. Denies fever, confusion, dizziness, sob, chest pain, palpitations, NVD, pain with urination,  and swelling.  Assessment & Plan:   Active Problems:   UTI (urinary tract infection)   Hip pain  Obstructive uropathy complicated by Hydronephrosis/hydroureter and UTI  - CT showed Moderate bilateral hydronephrosis and hydroureter   - place foley catheter  - rocephin  - prn zofran  Lower back, flank, and right hip pain with known myeloproliferative disease diagnosed in 08/2006  - DG hip: No acute findings. Diffusely sclerotic. No osseous fracture or dislocation seen. Possible metatasis   - DG lumbar spine: No acute findings.  No osseous fracture or dislocation.  - norco  - methocarbamol   - oncology consult  - PT/OT   CHF complicated by Pulmonary congestion  - torsemide   - coreg  - metolazone  - Imdur  OSA and chronic hypoxemia   - C-pap at night  - O2 by [place] during the day  CKD stage 3  - trend bmp  - creatine at 1.12 from .92  yesterday  HTN  - hydralazine   History of gout   - colchicine  Chronic iron and b12 deficiency anemia  - iron  - b12  - monitor cbc  CAD  - Asprin  - Lipitor      DVT prophylaxis: lovenox Code Status: full Family Communication: none Disposition Plan: inpatient   Consultants:   Urology, oncology, pt/ot  Procedures:     Antimicrobials:  rocephin   Objective: Vitals:   12/20/16 2030 12/20/16 2331 12/21/16 0619 12/21/16 1014  BP: 139/83 136/72 114/65 110/61  Pulse: 85 91 91 85  Resp: 17  18   Temp: 98.1 F (36.7 C)  98.4 F (36.9 C)   TempSrc: Oral  Oral   SpO2: 98%  97%   Weight: 90.5 kg (199 lb 9.6 oz)     Height: 6\' 3"  (1.905 m)       Intake/Output Summary (Last 24 hours) at 12/21/16 1024 Last data filed at 12/21/16 0059  Gross per 24 hour  Intake               50 ml  Output              200 ml  Net             -150 ml   Filed Weights   12/20/16 2030  Weight: 90.5 kg (199 lb 9.6 oz)    Examination:  General exam: Appears calm and comfortable  HEENT: NCAT, PERRL, OP moist and clear  Respiratory system: Clear to auscultation. No wheezes,crackle or rhonchi Cardiovascular system: S1 & S2 heard, RRR. No JVD, murmurs, rubs or gallops Gastrointestinal system: Abdomen is mildly distended with pelvic fullness, soft and mildly tender at pelvis with palpation. Normal bowel sounds heard. Central nervous system: Alert and oriented. No focal neurological deficits. Extremities: No pedal edema. Skin: No rashes, lesions or ulcers Psychiatry: Judgement and insight appear normal. Mood & affect appropriate.    Data Reviewed: I have personally reviewed following labs and imaging studies  CBC:  Recent Labs Lab 12/20/16 0613 16/10/96 0454  WBC DUPLICATE  9.4 8.4  NEUTROABS 5.0  --   HGB DUPLICATE  8.5* 8.0*  HCT DUPLICATE  09.8* 11.9*  MCV DUPLICATE  14.7 82.9  PLT DUPLICATE  562 130   Basic Metabolic Panel:  Recent Labs Lab 12/20/16 0613  12/21/16 0723  NA 140 139  K 4.0 4.7  CL 110 110  CO2 24 24  GLUCOSE 101* 95  BUN 26* 27*  CREATININE 0.92 1.16  CALCIUM 9.5 9.2   GFR: Estimated Creatinine Clearance: 65.8 mL/min (by C-G formula based on SCr of 1.16 mg/dL). Liver Function Tests: No results for input(s): AST, ALT, ALKPHOS, BILITOT, PROT, ALBUMIN in the last 168 hours. No results for input(s): LIPASE, AMYLASE in the last 168 hours. No results for input(s): AMMONIA in the last 168 hours. Coagulation Profile: No results for input(s): INR, PROTIME in the last 168 hours. Cardiac Enzymes:  Recent Labs Lab 12/20/16 0613  CKTOTAL 10*   BNP (last 3 results) No results for input(s): PROBNP in the last 8760 hours. HbA1C: No results for input(s): HGBA1C in the last 72 hours. CBG: No results for input(s): GLUCAP in the last 168 hours. Lipid Profile: No results for input(s): CHOL, HDL, LDLCALC, TRIG, CHOLHDL, LDLDIRECT in the last 72 hours. Thyroid Function Tests: No results for input(s): TSH, T4TOTAL, FREET4, T3FREE, THYROIDAB in the last 72 hours. Anemia Panel: No results for input(s): VITAMINB12, FOLATE, FERRITIN, TIBC, IRON, RETICCTPCT in the last 72 hours. Sepsis Labs:  Recent Labs Lab 12/20/16 0718 12/20/16 1128  LATICACIDVEN 0.58 0.47*    No results found for this or any previous visit (from the past 240 hour(s)).       Radiology Studies: Ct Abdomen Pelvis Wo Contrast  Result Date: 12/20/2016 CLINICAL DATA:  76 year old male with flank pain, nausea vomiting. Personal history of myeloproliferative disease. EXAM: CT ABDOMEN AND PELVIS WITHOUT CONTRAST TECHNIQUE: Multidetector CT imaging of the abdomen and pelvis was performed following the standard protocol without IV contrast. COMPARISON:  CT Abdomen and Pelvis 04/14/2016 FINDINGS: Lower chest: Chronic mosaic attenuation at the lung bases, favor due to gas trapping. Superimposed small bilateral layering pleural effusions, chronic elevation of the right  hemidiaphragm, and chronic posterior basal segment right lower lobe atelectasis. Cardiomegaly. No pericardial effusion. Hepatobiliary: Chronic elevation the right hemidiaphragm. Negative noncontrast liver and gallbladder. Pancreas: Negative noncontrast pancreas. Spleen: Chronic splenomegaly is stable. Adrenals/Urinary Tract: Normal adrenal glands. Increased bilateral renal collecting system size compatible with mild to moderate hydronephrosis. Associated bilateral hydroureter with tortuous bilateral ureters. No urologic calculus identified, but the urinary bladder is both abnormally distended and multi loculated appearing, suggesting multiple large bladder diverticula. Overall urinary bladder volume is estimated at 1,100 mL. No gas within the urinary bladder. Stomach/Bowel: Negative rectum aside from retained stool. Redundant sigmoid colon with severe diverticulosis. Gas and stool in the transverse colon and right colon. Oral contrast has reached the cecum. Normal appendix. Normal terminal ileum. No dilated  small bowel. Negative stomach and duodenum. No definite abdominal free fluid. Vascular/Lymphatic: Vascular patency is not evaluated in the absence of IV contrast. Aortoiliac calcified atherosclerosis. No lymphadenopathy is evident. Reproductive:  Negative. Other: Mild presacral stranding.  No definite pelvic free fluid. Curvilinear area of abnormal soft tissue thickening posterior to the right sacrum is felt to be a skin fold rather than a decubitus wound. A similar finding was present in 2017 along the left side of the sacrum. Musculoskeletal: Chronic abnormal bone mineralization diffusely. Diffuse abnormal sclerosis. No superimposed acute osseous abnormality identified. IMPRESSION: 1. Moderate bilateral hydronephrosis and hydroureter appears secondary to urinary bladder distension / bladder outlet obstruction. Enlarged urinary bladder and multifocal large bladder diverticula. Estimated total bladder volume 1.1  Liters. 2. Severe diverticulosis of the splenic flexure, no definite acute bowel inflammation. 3. Chronic splenomegaly and abnormal bone mineralization, likely the sequelae of the chronic myeloproliferative disease. 4. Chronic small pleural effusions with lung base gas trapping and atelectasis. 5. Cardiomegaly.  Aortic Atherosclerosis (ICD10-I70.0). Electronically Signed   By: Genevie Ann M.D.   On: 12/20/2016 21:58   X-ray Chest Pa And Lateral  Result Date: 12/20/2016 CLINICAL DATA:  Fever today. No chest pain or SOB. Only right flank pain. Hx of CAD, CHF, HTN, type II diabetes mellitus. EXAM: CHEST  2 VIEW COMPARISON:  Chest x-rays dated 11/14/2016, 11/02/2016 and 05/05/2016. FINDINGS: Stable cardiomegaly. Overall cardiomediastinal silhouette is grossly stable. Central pulmonary vascular congestion and bilateral interstitial prominence, presumed edema, similar to previous exams. No confluent opacity to suggest a developing pneumonia. No pleural effusion or pneumothorax seen. Stable scoliosis.  No acute appearing osseous abnormality. IMPRESSION: 1. Cardiomegaly with central pulmonary vascular congestion and bilateral interstitial prominence, presumed interstitial edema, suggesting CHF/volume overload. This appearance is not significantly changed compared to previous exams suggesting chronic CHF. 2. No evidence of pneumonia seen. Electronically Signed   By: Franki Cabot M.D.   On: 12/20/2016 18:31   Dg Lumbar Spine Complete  Result Date: 12/20/2016 CLINICAL DATA:  R flank pain with chills and dysuria since yesterday; EMS reports tender to palpation Mild lower back pain - mostly right flank says patient x 2 days. No injury. EXAM: LUMBAR SPINE - COMPLETE 4+ VIEW COMPARISON:  CT abdomen dated 04/14/2016. FINDINGS: Lumbar spine is diffusely sclerotic, similar to the appearance on earlier CT. No fracture line or displaced fracture fragment identified. Mild disc desiccations at L4-5 and L5-S1. Visualized paravertebral  soft tissues are unremarkable. IMPRESSION: 1. Osseous structures, lumbar spine and visualized portions of the osseous pelvis, are diffusely sclerotic, similar to the appearance on earlier CT, presumably diffuse metastatic disease (known primary cancer?). 2. No acute findings.  No osseous fracture or dislocation. Electronically Signed   By: Franki Cabot M.D.   On: 12/20/2016 16:03   Dg Hip Unilat W Or Wo Pelvis 2-3 Views Right  Result Date: 12/20/2016 CLINICAL DATA:  R flank pain with chills and dysuria since yesterday; EMS reports tender to palpation Mild lower back pain - mostly right flank says patient x 2 days. No injury. EXAM: DG HIP (WITH OR WITHOUT PELVIS) 2-3V RIGHT COMPARISON:  CT abdomen dated 04/14/2016. FINDINGS: Osseous structures of the pelvis and proximal femurs are diffusely sclerotic, similar to the appearance on earlier CT. No fracture line or displaced fracture fragment identified. No significant degenerative change at either hip joint or within the lower lumbar spine. Soft tissues about the pelvis and right hip are unremarkable. IMPRESSION: 1. Osseous structures of the pelvis and right hip are  diffusely sclerotic, similar to the appearance on earlier CT, presumably osseous metastasis (Known primary cancer?). 2. No acute findings.  No osseous fracture or dislocation seen. Electronically Signed   By: Franki Cabot M.D.   On: 12/20/2016 16:05      Scheduled Meds: . aspirin  325 mg Oral Daily  . atorvastatin  40 mg Oral Daily  . B-complex with vitamin C  1 tablet Oral Daily  . carvedilol  3.125 mg Oral BID WC  . colchicine  0.6 mg Oral Daily  . enoxaparin (LOVENOX) injection  40 mg Subcutaneous Q24H  . feeding supplement (GLUCERNA SHAKE)  118.5 mL Oral Daily  . ferrous gluconate  324 mg Oral BID WC  . fluticasone  2 spray Each Nare Daily  . gabapentin  200 mg Oral BID  . hydrALAZINE  50 mg Oral TID  . isosorbide mononitrate  30 mg Oral BID  . methocarbamol  750 mg Oral TID  .  metolazone  2.5 mg Oral Daily  . potassium chloride SA  20 mEq Oral Daily  . torsemide  80 mg Oral Daily   Continuous Infusions: . cefTRIAXone (ROCEPHIN)  IV Stopped (12/21/16 0130)     LOS: 0 days   Mitzie Na, PA-S Pager: Text Page via [email or web address]  [phone]  If 7PM-7AM, please contact night-coverage [email or web address] Password [email or web address] 12/21/2016, 10:24 AM

## 2016-12-21 NOTE — Plan of Care (Signed)
Problem: Safety: Goal: Ability to remain free from injury will improve Outcome: Progressing Pt will be free from falls and injuries during this hospitalization.  Problem: Urinary Elimination: Goal: Signs and symptoms of infection will decrease Outcome: Progressing Pt will be free from signs and symptoms of UTI prior to discharge.

## 2016-12-21 NOTE — Progress Notes (Signed)
OT Cancellation    12/21/16 1500  OT Visit Information  Last OT Received On 12/21/16  Reason Eval/Treat Not Completed Fatigue/lethargy limiting ability to participate (Pt in deep sleep upon arrival. Request OT return later. Will return as schedule allows for OT eval.)   [place], OTR/L Acute Rehab Pager: [phone] Office: [phone]

## 2016-12-21 NOTE — Evaluation (Signed)
Physical Therapy Evaluation Patient Details Name: Juan [name] MRN: [id number] DOB: 1941-02-26 Today's Date: 12/21/2016   History of Present Illness  Patient is a 75 yo male admitted 12/20/16 with worsening Rt hip and flank pain, and UTI.  X-ray showed a diffuse sclerotic disease, possible osseous mets.    PMH:  CAD, CHF, history of myeloproliferative disease, chronic anemia, chronic respiratory failure with hypoxia on 2 L of oxygen at home, HTN, DM, gout.    Clinical Impression  Patient presents with problems listed below.  Will benefit from acute PT to maximize functional mobility prior to discharge.  Patient requiring min - mod assist for mobility and transfers.  Unable to tolerate ambulation today due to pain.  Recommend ST-SNF for continued therapy prior to return home with family.    Follow Up Recommendations SNF;Supervision/Assistance - 24 hour    Equipment Recommendations  None recommended by PT    Recommendations for Other Services       Precautions / Restrictions Precautions Precautions: Fall Restrictions Weight Bearing Restrictions: No      Mobility  Bed Mobility Overal bed mobility: Needs Assistance Bed Mobility: Supine to Sit;Sit to Supine     Supine to sit: Mod assist;+2 for physical assistance;HOB elevated Sit to supine: Mod assist;HOB elevated   General bed mobility comments: Verbal cues for technique. Min assist to move LE's to EOB.  Mod assist to bring trunk to upright sitting position.Marland Kitchen  Required assist to bring BLE's onto bed to return to supine.  Required increased time for mobility due to pain.  Transfers Overall transfer level: Needs assistance Equipment used: Rolling walker (2 wheeled) Transfers: Sit to/from Stand Sit to Stand: Min assist;+2 physical assistance         General transfer comment: Assist to rise to stance and for balance once upright.  Ambulation/Gait Ambulation/Gait assistance: Min assist Ambulation Distance (Feet): 2 Feet Assistive  device: Rolling walker (2 wheeled) Gait Pattern/deviations: Step-to pattern     General Gait Details: Patient able to take several side-steps toward [place].   Increased time due to pain.  Stairs            Wheelchair Mobility    Modified Rankin (Stroke Patients Only)       Balance                                             Pertinent Vitals/Pain Pain Assessment: 0-10 Pain Score: 10-Worst pain ever Pain Location: Rt hip and flank Pain Descriptors / Indicators: Aching;Sore Pain Intervention(s): Limited activity within patient's tolerance;Monitored during session;Premedicated before session;Repositioned    Home Living Family/patient expects to be discharged to:: Private residence Living Arrangements: Children (Son) Available Help at Discharge: Family;Available 24 hours/day Type of Home: Apartment Home Access: Level entry     Home Layout: One level Home Equipment: Walker - 2 wheels;Cane - single point;Shower seat;Bedside commode;Wheelchair - manual      Prior Function Level of Independence: Needs assistance   Gait / Transfers Assistance Needed: Ambulates with cane  ADL's / Homemaking Assistance Needed: Pt's son performs housekeeping and cooking. Pt states he is able to bathe, dress and toilet himself at baseline.         Hand Dominance   Dominant Hand: Right    Extremity/Trunk Assessment   Upper Extremity Assessment Upper Extremity Assessment: Overall WFL for tasks assessed (Difficulty using RUE due to flank  pain.)    Lower Extremity Assessment Lower Extremity Assessment: Generalized weakness;RLE deficits/detail RLE Deficits / Details: Decreased strength due to pain in hip RLE: Unable to fully assess due to pain       Communication   Communication: No difficulties  Cognition Arousal/Alertness: Lethargic Behavior During Therapy: Anxious;Flat affect Overall Cognitive Status: No family/caregiver present to determine baseline cognitive  functioning                                 General Comments: Slow processing information.        General Comments      Exercises     Assessment/Plan    PT Assessment Patient needs continued PT services  PT Problem List Decreased strength;Decreased activity tolerance;Decreased balance;Decreased mobility;Decreased knowledge of use of DME;Cardiopulmonary status limiting activity;Pain       PT Treatment Interventions DME instruction;Gait training;Functional mobility training;Therapeutic activities;Patient/family education    PT Goals (Current goals can be found in the Care Plan section)  Acute Rehab PT Goals Patient Stated Goal: To decrease pain PT Goal Formulation: With patient Time For Goal Achievement: 12/28/16 Potential to Achieve Goals: Fair    Frequency Min 3X/week   Barriers to discharge        Co-evaluation               AM-PAC PT "6 Clicks" Daily Activity  Outcome Measure Difficulty turning over in bed (including adjusting bedclothes, sheets and blankets)?: None Difficulty moving from lying on back to sitting on the side of the bed? : Total Difficulty sitting down on and standing up from a chair with arms (e.g., wheelchair, bedside commode, etc,.)?: Total Help needed moving to and from a bed to chair (including a wheelchair)?: A Little Help needed walking in hospital room?: A Little Help needed climbing 3-5 steps with a railing? : A Lot 6 Click Score: 14    End of Session Equipment Utilized During Treatment: Gait belt;Oxygen Activity Tolerance: Patient limited by fatigue;Patient limited by pain Patient left: in bed;with call [place]/phone within reach;with bed alarm set Nurse Communication: Patient requests pain meds PT Visit Diagnosis: Unsteadiness on feet (R26.81);Muscle weakness (generalized) (M62.81);Difficulty in walking, not elsewhere classified (R26.2);Pain Pain - Right/Left: Right Pain - part of body: Hip (and flank)    Time:  7017-7939 PT Time Calculation (min) (ACUTE ONLY): 37 min   Charges:   PT Evaluation $PT Eval Moderate Complexity: 1 Mod PT Treatments $Therapeutic Activity: 8-22 mins   PT G Codes:   PT G-Codes **NOT FOR INPATIENT CLASS** Functional Assessment Tool Used: AM-PAC 6 Clicks Basic Mobility Functional Limitation: Mobility: Walking and moving around Mobility: Walking and Moving Around Current Status (Q3009): At least 40 percent but less than 60 percent impaired, limited or restricted Mobility: Walking and Moving Around Goal Status [phone]): At least 20 percent but less than 40 percent impaired, limited or restricted    Carita Pian. Sanjuana Kava, [facility] Acute Rehab Services Pager [place] 12/21/2016, 6:50 PM

## 2016-12-22 ENCOUNTER — Inpatient Hospital Stay (HOSPITAL_COMMUNITY): Payer: Medicare Other

## 2016-12-22 DIAGNOSIS — D471 Chronic myeloproliferative disease: Secondary | ICD-10-CM

## 2016-12-22 DIAGNOSIS — R109 Unspecified abdominal pain: Secondary | ICD-10-CM

## 2016-12-22 LAB — URINE CULTURE

## 2016-12-22 LAB — COMPREHENSIVE METABOLIC PANEL
ALT: 11 U/L — AB (ref 17–63)
AST: 23 U/L (ref 15–41)
Albumin: 2.7 g/dL — ABNORMAL LOW (ref 3.5–5.0)
Alkaline Phosphatase: 118 U/L (ref 38–126)
Anion gap: 8 (ref 5–15)
BUN: 34 mg/dL — AB (ref 6–20)
CHLORIDE: 107 mmol/L (ref 101–111)
CO2: 24 mmol/L (ref 22–32)
CREATININE: 1.47 mg/dL — AB (ref 0.61–1.24)
Calcium: 9.2 mg/dL (ref 8.9–10.3)
GFR calc Af Amer: 52 mL/min — ABNORMAL LOW (ref >60)
GFR calc non Af Amer: 45 mL/min — ABNORMAL LOW (ref >60)
GLUCOSE: 99 mg/dL (ref 65–99)
Potassium: 5.3 mmol/L — ABNORMAL HIGH (ref 3.5–5.1)
SODIUM: 139 mmol/L (ref 135–145)
Total Bilirubin: 0.6 mg/dL (ref 0.3–1.2)
Total Protein: 5.7 g/dL — ABNORMAL LOW (ref 6.5–8.1)

## 2016-12-22 LAB — PROTIME-INR
INR: 1.22
Prothrombin Time: 15.5 seconds — ABNORMAL HIGH (ref 11.4–15.2)

## 2016-12-22 LAB — CBC WITH DIFFERENTIAL/PLATELET
BAND NEUTROPHILS: 7 %
BASOS ABS: 0.2 10*3/uL — AB (ref 0.0–0.1)
BLASTS: 0 %
Basophils Relative: 3 %
Eosinophils Absolute: 0.2 10*3/uL (ref 0.0–0.7)
Eosinophils Relative: 3 %
HEMATOCRIT: 29.4 % — AB (ref 39.0–52.0)
Hemoglobin: 8.7 g/dL — ABNORMAL LOW (ref 13.0–17.0)
LYMPHS ABS: 1.3 10*3/uL (ref 0.7–4.0)
Lymphocytes Relative: 16 %
MCH: 24.8 pg — ABNORMAL LOW (ref 26.0–34.0)
MCHC: 29.6 g/dL — ABNORMAL LOW (ref 30.0–36.0)
MCV: 83.8 fL (ref 78.0–100.0)
METAMYELOCYTES PCT: 1 %
MONOS PCT: 11 %
Monocytes Absolute: 0.9 10*3/uL (ref 0.1–1.0)
Myelocytes: 1 %
Neutro Abs: 5.4 10*3/uL (ref 1.7–7.7)
Neutrophils Relative %: 55 %
PROMYELOCYTES ABS: 3 %
Platelets: 388 10*3/uL (ref 150–400)
RBC: 3.51 MIL/uL — ABNORMAL LOW (ref 4.22–5.81)
RDW: 20.6 % — AB (ref 11.5–15.5)
WBC: 8 10*3/uL (ref 4.0–10.5)
nRBC: 0 /100 WBC

## 2016-12-22 LAB — IRON AND TIBC
Iron: 11 ug/dL — ABNORMAL LOW (ref 45–182)
SATURATION RATIOS: 6 % — AB (ref 17.9–39.5)
TIBC: 192 ug/dL — ABNORMAL LOW (ref 250–450)
UIBC: 181 ug/dL

## 2016-12-22 LAB — VITAMIN B12: Vitamin B-12: 1066 pg/mL — ABNORMAL HIGH (ref 180–914)

## 2016-12-22 LAB — FERRITIN: Ferritin: 260 ng/mL (ref 24–336)

## 2016-12-22 LAB — PREPARE RBC (CROSSMATCH)

## 2016-12-22 LAB — RETICULOCYTES
RBC.: 3.51 MIL/uL — AB (ref 4.22–5.81)
RETIC COUNT ABSOLUTE: 94.8 10*3/uL (ref 19.0–186.0)
RETIC CT PCT: 2.7 % (ref 0.4–3.1)

## 2016-12-22 LAB — APTT: aPTT: 42 seconds — ABNORMAL HIGH (ref 24–36)

## 2016-12-22 LAB — SAVE SMEAR

## 2016-12-22 LAB — LACTATE DEHYDROGENASE: LDH: 698 U/L — ABNORMAL HIGH (ref 98–192)

## 2016-12-22 MED ORDER — SODIUM CHLORIDE 0.9 % IV SOLN
Freq: Once | INTRAVENOUS | Status: AC
  Administered 2016-12-22: 12:00:00 via INTRAVENOUS

## 2016-12-22 MED ORDER — TECHNETIUM TC 99M MEDRONATE IV KIT
25.0000 | PACK | Freq: Once | INTRAVENOUS | Status: AC | PRN
  Administered 2016-12-22: 25 via INTRAVENOUS

## 2016-12-22 MED ORDER — FUROSEMIDE 10 MG/ML IJ SOLN
20.0000 mg | Freq: Once | INTRAMUSCULAR | Status: DC
  Filled 2016-12-22: qty 2

## 2016-12-22 NOTE — Consult Note (Signed)
Chief Complaint: Patient was seen in consultation today for bone marrow biopsy Chief Complaint  Patient presents with  . Flank Pain  . Chills  . Dysuria   at the request of Dr Pearletha Alfred   Supervising Physician: Sandi Mariscal  Patient Status: [facility] - In-pt  History of Present Illness: Juan [name] is a 76 y.o. male   Known anemia and myeloproliferative disorder Known to Hematology Dr Marin Olp Pt has chosen no work up til now. Admits with flank pain; chills; dysuria CT 12/20/16: IMPRESSION: 1. Moderate bilateral hydronephrosis and hydroureter appears secondary to urinary bladder distension / bladder outlet obstruction. Enlarged urinary bladder and multifocal large bladder diverticula. Estimated total bladder volume 1.1 Liters. 2. Severe diverticulosis of the splenic flexure, no definite acute bowel inflammation. 3. Chronic splenomegaly and abnormal bone mineralization, likely the sequelae of the chronic myeloproliferative disease. 4. Chronic small pleural effusions with lung base gas trapping and atelectasis. 5. Cardiomegaly.  Aortic Atherosclerosis (ICD10-I70.0).  Request now for bone marrow biopsy Scheduled for 8/8 am    Past Medical History:  Diagnosis Date  . Allergic rhinitis   . Anemia   . Ascending aortic aneurysm ([place]) 03/2014   4.3cm on CT scan  . CAD (coronary artery disease)    dx elsewheer in past, no documentation. Non-ischemic myoview 2007  . Chronic diastolic CHF (congestive heart failure), NYHA class 2 (HCC)    Normal EF w/ grade 1 dd by echo 12/2015  . Edema    R>L leg, u/s 5-12 neg for DVT  . Hemorrhoid   . History of thrombocytosis   . Hypertension   . Migraine    "once/wk at least" (07/11/2013)  . Myeloproliferative disease ([place])   . Shortness of breath   . Sinus congestion   . Sleep apnea, obstructive    at some point used CPAP, was d/c  years ago  . Type II diabetes mellitus ([place])     Past Surgical History:  Procedure Laterality Date    . TOE SURGERY Right    "tried to straighten out big toe" (07/11/2013)    Allergies: Patient has no known allergies.  Medications: Prior to Admission medications   Medication Sig Start Date End Date Taking? Authorizing Provider  acetaminophen (TYLENOL) 325 MG tablet Take 2 tablets (650 mg total) by mouth every 6 (six) hours as needed for mild pain, fever or headache. 11/16/16  Yes Short, Noah Delaine, MD  aspirin 325 MG tablet Take 325 mg by mouth daily.   Yes [provider]  atorvastatin (LIPITOR) 40 MG tablet Take 1 tablet (40 mg total) by mouth daily. 11/16/16 11/16/17 Yes Short, Noah Delaine, MD  B Complex Vitamins (VITAMIN B COMPLEX PO) Take 100 mg by mouth daily.   Yes [provider]  bisacodyl (DULCOLAX) 10 MG suppository Place 1 suppository (10 mg total) rectally daily as needed for moderate constipation. 04/18/16  Yes Hongalgi, Lenis Dickinson, MD  carvedilol (COREG) 3.125 MG tablet Take 1 tablet (3.125 mg total) by mouth 2 (two) times daily with a meal. 10/02/16  Yes Tillery, Satira Mccallum, PA-C  colchicine 0.6 MG tablet Take 1 tablet (0.6 mg total) by mouth daily as needed. Patient taking differently: Take 0.6 mg by mouth daily as needed (gout).  11/04/16  Yes Theodis Blaze, MD  diclofenac sodium (VOLTAREN) 1 % GEL Apply 4 g topically 2 (two) times daily as needed (pain). Apply to bilateral  knees    Yes [provider]  feeding supplement, [place], ([place]  SHAKE) LIQD Take 118.5 mLs by mouth daily. Reported on 08/21/2015   Yes [provider]  ferrous gluconate (FERGON) 324 MG tablet Take 1 tablet (324 mg total) by mouth 3 (three) times daily with meals. 11/16/16  Yes Short, Noah Delaine, MD  fluticasone (FLONASE) 50 MCG/ACT nasal spray Place 2 sprays into both nostrils daily. Reported on 05/02/2015   Yes [provider]  gabapentin (NEURONTIN) 100 MG capsule Take 2 capsules (200 mg total) by mouth 3 (three) times daily as needed (pain). Reported on  08/22/2015 10/17/16  Yes Rama, Venetia Maxon, MD  hydrALAZINE (APRESOLINE) 25 MG tablet Take 50 mg by mouth 3 (three) times daily.    Yes [provider]  hydrocerin (EUCERIN) CREA Apply Eucerin cream to BLE Q day after bathing and roughly towel drying to remove loose skin Patient taking differently: Apply 1 application topically daily as needed (dryness).  01/27/14  Yes Ghimire, Henreitta Leber, MD  HYDROcodone-acetaminophen (NORCO/VICODIN) 5-325 MG tablet Take 1-2 tablets by mouth every 6 (six) hours as needed for moderate pain. 11/16/16  Yes Short, Noah Delaine, MD  isosorbide mononitrate (IMDUR) 30 MG 24 hr tablet Take 30 mg by mouth 2 (two) times daily.   Yes [provider]  methocarbamol (ROBAXIN) 750 MG tablet Take 1 tablet (750 mg total) by mouth 3 (three) times daily. 10/17/16  Yes Rama, Venetia Maxon, MD  metolazone (ZAROXOLYN) 2.5 MG tablet Take 1 tablet (2.5 mg total) by mouth as directed. Patient taking differently: Take 2.5 mg by mouth daily.  10/23/16 10/23/17 Yes Larey Dresser, MD  OXYGEN Inhale 2 L into the lungs continuous.   Yes [provider]  potassium chloride SA (K-DUR,KLOR-CON) 20 MEQ tablet Take 20 mEq by mouth daily. Taken with metolazone.    Yes [provider]  simethicone (MYLICON) 80 MG chewable tablet Chew 80 mg by mouth every 6 (six) hours as needed for flatulence.   Yes [provider]  torsemide (DEMADEX) 20 MG tablet Take 4 tablets (80 mg total) by mouth daily. 10/23/16 01/21/17 Yes Larey Dresser, MD  amoxicillin-clavulanate (AUGMENTIN) 500-125 MG tablet Take 1 tablet (500 mg total) by mouth every 12 (twelve) hours. Patient not taking: Reported on 12/20/2016 11/16/16   Janece Canterbury, MD     Family History  Problem Relation Age of Onset  . Schizophrenia Son   . Colon cancer Neg Hx   . Prostate cancer Neg Hx   . Heart attack Neg Hx   . Diabetes Neg Hx     Social History   Social History  . Marital status: Widowed    Spouse name: N/A   . Number of children: 2  . Years of education: N/A   Occupational History  . retired    .  Retired   Social History Main Topics  . Smoking status: Former Smoker    Packs/day: 0.25    Years: 12.00    Types: Cigarettes    Quit date: 05/18/1966  . Smokeless tobacco: Never Used     Comment: quit smoking 45 years ago  . Alcohol use No  . Drug use: No  . Sexual activity: Yes   Other Topics Concern  . None   Social History Narrative   Moved from [place] 2006   Widow 2007   Son lives with him     Review of Systems: A 12 point ROS discussed and pertinent positives are indicated in the HPI above.  All other systems are negative.  Review of Systems  Constitutional: Positive for activity change, appetite change and fatigue. Negative for fever and unexpected weight change.  Respiratory: Negative for cough and shortness of breath.   Cardiovascular: Negative for chest pain.  Gastrointestinal: Negative for abdominal pain.  Genitourinary: Positive for flank pain.  Musculoskeletal: Negative for gait problem.  Neurological: Positive for weakness.  Psychiatric/Behavioral: Negative for behavioral problems and confusion.    Vital Signs: BP (!) 141/64 (BP Location: Right Arm)   Pulse 95   Temp 98.5 F (36.9 C) (Oral)   Resp 18   Ht _0  (1.905 m)   Wt 199 lb 9.6 oz (90.5 kg)   SpO2 98%   BMI 24.95 kg/m   Physical Exam  Constitutional: He is oriented to person, place, and time.  Cardiovascular: Normal rate and regular rhythm.   Pulmonary/Chest: Effort normal and breath sounds normal.  Abdominal: Soft. Bowel sounds are normal.  Musculoskeletal: Normal range of motion.  Neurological: He is alert and oriented to person, place, and time.  Skin: Skin is warm and dry.  Psychiatric: He has a normal mood and affect. His behavior is normal. Judgment and thought content normal.  Nursing note and vitals reviewed.   Mallampati Score:  MD Evaluation Airway: WNL Heart: WNL Abdomen:  WNL ASA  Classification: 3 Mallampati/Airway Score: Two  Imaging: Ct Abdomen Pelvis Wo Contrast  Result Date: 12/20/2016 CLINICAL DATA:  76 year old male with flank pain, nausea vomiting. Personal history of myeloproliferative disease. EXAM: CT ABDOMEN AND PELVIS WITHOUT CONTRAST TECHNIQUE: Multidetector CT imaging of the abdomen and pelvis was performed following the standard protocol without IV contrast. COMPARISON:  CT Abdomen and Pelvis 04/14/2016 FINDINGS: Lower chest: Chronic mosaic attenuation at the lung bases, favor due to gas trapping. Superimposed small bilateral layering pleural effusions, chronic elevation of the right hemidiaphragm, and chronic posterior basal segment right lower lobe atelectasis. Cardiomegaly. No pericardial effusion. Hepatobiliary: Chronic elevation the right hemidiaphragm. Negative noncontrast liver and gallbladder. Pancreas: Negative noncontrast pancreas. Spleen: Chronic splenomegaly is stable. Adrenals/Urinary Tract: Normal adrenal glands. Increased bilateral renal collecting system size compatible with mild to moderate hydronephrosis. Associated bilateral hydroureter with tortuous bilateral ureters. No urologic calculus identified, but the urinary bladder is both abnormally distended and multi loculated appearing, suggesting multiple large bladder diverticula. Overall urinary bladder volume is estimated at 1,100 mL. No gas within the urinary bladder. Stomach/Bowel: Negative rectum aside from retained stool. Redundant sigmoid colon with severe diverticulosis. Gas and stool in the transverse colon and right colon. Oral contrast has reached the cecum. Normal appendix. Normal terminal ileum. No dilated small bowel. Negative stomach and duodenum. No definite abdominal free fluid. Vascular/Lymphatic: Vascular patency is not evaluated in the absence of IV contrast. Aortoiliac calcified atherosclerosis. No lymphadenopathy is evident. Reproductive:  Negative. Other: Mild presacral  stranding.  No definite pelvic free fluid. Curvilinear area of abnormal soft tissue thickening posterior to the right sacrum is felt to be a skin fold rather than a decubitus wound. A similar finding was present in 2017 along the left side of the sacrum. Musculoskeletal: Chronic abnormal bone mineralization diffusely. Diffuse abnormal sclerosis. No superimposed acute osseous abnormality identified. IMPRESSION: 1. Moderate bilateral hydronephrosis and hydroureter appears secondary to urinary bladder distension / bladder outlet obstruction. Enlarged urinary bladder and multifocal large bladder diverticula. Estimated total bladder volume 1.1 Liters. 2. Severe diverticulosis of the splenic flexure, no definite acute bowel inflammation. 3. Chronic splenomegaly and abnormal bone mineralization, likely the sequelae of the chronic myeloproliferative disease. 4. Chronic small pleural effusions with lung base  gas trapping and atelectasis. 5. Cardiomegaly.  Aortic Atherosclerosis (ICD10-I70.0). Electronically Signed   By: Genevie Ann M.D.   On: 12/20/2016 21:58   X-ray Chest Pa And Lateral  Result Date: 12/20/2016 CLINICAL DATA:  Fever today. No chest pain or SOB. Only right flank pain. Hx of CAD, CHF, HTN, type II diabetes mellitus. EXAM: CHEST  2 VIEW COMPARISON:  Chest x-rays dated 11/14/2016, 11/02/2016 and 05/05/2016. FINDINGS: Stable cardiomegaly. Overall cardiomediastinal silhouette is grossly stable. Central pulmonary vascular congestion and bilateral interstitial prominence, presumed edema, similar to previous exams. No confluent opacity to suggest a developing pneumonia. No pleural effusion or pneumothorax seen. Stable scoliosis.  No acute appearing osseous abnormality. IMPRESSION: 1. Cardiomegaly with central pulmonary vascular congestion and bilateral interstitial prominence, presumed interstitial edema, suggesting CHF/volume overload. This appearance is not significantly changed compared to previous exams  suggesting chronic CHF. 2. No evidence of pneumonia seen. Electronically Signed   By: Franki Cabot M.D.   On: 12/20/2016 18:31   Dg Lumbar Spine Complete  Result Date: 12/20/2016 CLINICAL DATA:  R flank pain with chills and dysuria since yesterday; EMS reports tender to palpation Mild lower back pain - mostly right flank says patient x 2 days. No injury. EXAM: LUMBAR SPINE - COMPLETE 4+ VIEW COMPARISON:  CT abdomen dated 04/14/2016. FINDINGS: Lumbar spine is diffusely sclerotic, similar to the appearance on earlier CT. No fracture line or displaced fracture fragment identified. Mild disc desiccations at L4-5 and L5-S1. Visualized paravertebral soft tissues are unremarkable. IMPRESSION: 1. Osseous structures, lumbar spine and visualized portions of the osseous pelvis, are diffusely sclerotic, similar to the appearance on earlier CT, presumably diffuse metastatic disease (known primary cancer?). 2. No acute findings.  No osseous fracture or dislocation. Electronically Signed   By: Franki Cabot M.D.   On: 12/20/2016 16:03   Dg Hip Unilat W Or Wo Pelvis 2-3 Views Right  Result Date: 12/20/2016 CLINICAL DATA:  R flank pain with chills and dysuria since yesterday; EMS reports tender to palpation Mild lower back pain - mostly right flank says patient x 2 days. No injury. EXAM: DG HIP (WITH OR WITHOUT PELVIS) 2-3V RIGHT COMPARISON:  CT abdomen dated 04/14/2016. FINDINGS: Osseous structures of the pelvis and proximal femurs are diffusely sclerotic, similar to the appearance on earlier CT. No fracture line or displaced fracture fragment identified. No significant degenerative change at either hip joint or within the lower lumbar spine. Soft tissues about the pelvis and right hip are unremarkable. IMPRESSION: 1. Osseous structures of the pelvis and right hip are diffusely sclerotic, similar to the appearance on earlier CT, presumably osseous metastasis (Known primary cancer?). 2. No acute findings.  No osseous fracture  or dislocation seen. Electronically Signed   By: Franki Cabot M.D.   On: 12/20/2016 16:05    Labs:  CBC:  Recent Labs  11/16/16 0529 12/20/16 0613 12/21/16 0723 12/22/16 5916  WBC 9.8 DUPLICATE  9.4 8.4 8.0  HGB 8.2* DUPLICATE  8.5* 8.0* 8.7*  HCT 38.4* DUPLICATE  66.5* 99.3* 57.0*  PLT 177 DUPLICATE  939 030 092    COAGS:  Recent Labs  11/14/16 0158 12/22/16 0711  INR 1.27 1.22  APTT  --  42*    BMP:  Recent Labs  11/15/16 0533 11/16/16 0529 12/20/16 0613 12/21/16 0723  NA 136 138 140 139  K 4.0 3.9 4.0 4.7  CL 101 103 110 110  CO2 _0 GLUCOSE 107* 108* 101* 95  BUN 56* 49*  26* 27*  CALCIUM 9.2 9.0 9.5 9.2  CREATININE 1.22 1.09 0.92 1.16  GFRNONAA 57* >60 >60 60*  GFRAA >60 >60 >60 >60    LIVER FUNCTION TESTS:  Recent Labs  05/05/16 2027 10/08/16 0435 10/31/16 0810 11/14/16 0154  BILITOT 0.2* 0.6 0.6 0.5  AST 24 32 22 25  ALT 16* 15* 13* 13*  ALKPHOS 160* 104 112 105  PROT 5.7* 6.0* 6.4* 5.9*  ALBUMIN 2.5* 3.2* 3.0* 3.0*    TUMOR MARKERS: No results for input(s): AFPTM, CEA, CA199, CHROMGRNA in the last 8760 hours.  Assessment and Plan:  Anemia Myeloproliferative disorder Scheduled for Bone marrow bx in am Risks and benefits discussed with the patient including, but not limited to bleeding, infection, damage to adjacent structures or low yield requiring additional tests. All of the patient's questions were answered, patient is agreeable to proceed. Consent signed and in chart.  Thank you for this interesting consult.  I greatly enjoyed meeting Juan [name] and look forward to participating in their care.  A copy of this report was sent to the requesting provider on this date.  Electronically Signed: Lavonia Drafts, PA-C 12/22/2016, 8:08 AM   I spent a total of 20 Minutes    in face to face in clinical consultation, greater than 50% of which was counseling/coordinating care for BM bx

## 2016-12-22 NOTE — Progress Notes (Signed)
PROGRESS NOTE [facility]   Lamin Chandley   ZOX:[id number] DOB: June 16, 1940  DOA: 12/20/2016 PCP: Colon Branch, MD   Brief Narrative:  76 yo male with PMH of CAD, diastolic CHF, myloproliferative disease without f/u with oncology, CKD III, BPH, chronic B12 and iron anemia, OSA/chronic hypoxemia on home oxygen, presented to the ED with worsening right hip pain and flank pain. He uses a cane at home and reports trouble walking from worsening hip pain. CT showed bilateral hydronephrosis and hydroureter due to obstructive uropathy. DG hip showed diffuse sclerosis of the osseous structures of the pelvis and right hip similar to earlier CT. On abx for possible UTI  He was admitted for possible metastatic disease, and is currently being treated for obstructive uropathy and UTI.  Subjective: He is laying in bed comfortably just finishing up breakfast in NAD. PT came by yesterday and he states that his hip only hurts with weight bearing.  He complains of bilateral flank pain that is improving and nausea. Is not complaining of urinary cath site pain. Denies dizziness, fever, confusion, SOB, chest pain, vomiting, diarrhea, abdominal pain, or swelling.   Assessment & Plan:   Active Problems:   UTI (urinary tract infection)   Hip pain  Obstructive uropathy complicated by Hydronephrosis/hydroureter and UTI site unkown             - CT showed Moderate bilateral hydronephrosis and hydroureter              - foley catheter placed yesterday   - 1.9 L output yesterday, .45 L out today, and 2.5 L out total             - rocephin             - prn zofran  Acute on chronic kidney injury - stage 3 ckd  - most likely due from above  - creatinine at 1.47 from 1.16, BUN at 34 from 27   - trend BMP  - Recent BMP shows hyperkalemia, hold K   Lower back, flank, and right hip pain with known myeloproliferative disease diagnosed in 08/2006  - LDH at 698             - DG hip: No acute findings. Diffusely  sclerotic. No osseous fracture or dislocation seen. Possible metatasis              - DG lumbar spine: No acute findings. No osseous fracture or dislocation.  - bone marrow biopsy tomorrow AM  - planning for 2 units PRBC  - Pain inmproving             - norco             - methocarbamol              - oncology consult             - PT/OT              Diastolic CHF, stable, appears euvolemic             - torsemide              - coreg             - metolazone             - Imdur  OSA and chronic respiratory failure with hypoxemia that requires home O2, stable             -  C-pap at night             - O2 by [place] during the day  - at 98% with 2L [place]  CKD stage 3 stable             - trend bmp             - creatine at 1.12 from .46 yesterday  HTN uncontrolled, came in with BP 150's/80's, stabilized yesterday at 110/64 but is running high again today at 141/64  - monitor              - hydralazine              History of gout stable             - colchicine  Chronic iron and b12 deficiency anemia  - anemia improving             - iron  - iron at 11 today, ferritin at 260, TIBC 192             - b12  - b12 at 1066             - monitor cbc  Stable CAD             - Asprin             - Lipitor                 DVT prophylaxis: Lovenox Code Status: full Family Communication: none Disposition Plan: inpatient   Consultants:   Oncology and urology  Procedures:     Antimicrobials:  rocephin   Objective: Vitals:   12/21/16 1310 12/21/16 1738 12/21/16 2122 12/22/16 0505  BP: (!) 108/59 118/68 110/64 (!) 141/64  Pulse: 62 80 83 95  Resp: _0 Temp: 98.3 F (36.8 C)  98 F (36.7 C) 98.5 F (36.9 C)  TempSrc: Oral  Oral Oral  SpO2: 98%  100% 98%  Weight:      Height:        Intake/Output Summary (Last 24 hours) at 12/22/16 1028 Last data filed at 12/22/16 0913  Gross per 24 hour  Intake               50 ml  Output             2400 ml    Net            -2350 ml   Filed Weights   12/20/16 2030  Weight: 90.5 kg (199 lb 9.6 oz)    Examination:  General exam: Appears calm and comfortable  HEENT: NCAT, PERRL, OP moist and clear Respiratory system: Clear to auscultation. No wheezes,crackle or rhonchi Cardiovascular system: S1 & S2 heard, RRR. No JVD, murmurs, rubs or gallops Gastrointestinal system: Abdomen is nondestended, soft and mildly tender at pelvis and flanks. Normal bowel sounds heard. Central nervous system: Alert and oriented. No focal neurological deficits. Extremities: No pedal edema.  Skin: No rashes, lesions or ulcers Psychiatry: Judgement and insight appear normal. Mood & affect appropriate.     Data Reviewed: I have personally reviewed following labs and imaging studies  CBC:  Recent Labs Lab 12/20/16 0613 12/21/16 0723 [id number]/63 [id number]  WBC DUPLICATE  9.4 8.4 8.0  NEUTROABS 5.0  --  5.4  HGB DUPLICATE  8.5* 8.0* 8.7*  HCT DUPLICATE  64.6* 80.3* 21.2*  MCV DUPLICATE  24.8 25.0 03.7  PLT DUPLICATE  333 329 761   Basic Metabolic Panel:  Recent Labs Lab 12/20/16 0613 12/21/16 0723 12/22/16 0711  NA 140 139 139  K 4.0 4.7 5.3*  CL 110 110 107  CO2 _0 GLUCOSE 101* 95 99  BUN 26* 27* 34*  CREATININE 0.92 1.16 1.47*  CALCIUM 9.5 9.2 9.2   GFR: Estimated Creatinine Clearance: 51.9 mL/min (A) (by C-G formula based on SCr of 1.47 mg/dL (H)). Liver Function Tests:  Recent Labs Lab 12/22/16 0711  AST 23  ALT 11*  ALKPHOS 118  BILITOT 0.6  PROT 5.7*  ALBUMIN 2.7*   No results for input(s): LIPASE, AMYLASE in the last 168 hours. No results for input(s): AMMONIA in the last 168 hours. Coagulation Profile:  Recent Labs Lab 12/22/16 0711  INR 1.22   Cardiac Enzymes:  Recent Labs Lab 12/20/16 0613  CKTOTAL 10*   BNP (last 3 results) No results for input(s): PROBNP in the last 8760 hours. HbA1C: No results for input(s): HGBA1C in the last 72 hours. CBG: No  results for input(s): GLUCAP in the last 168 hours. Lipid Profile: No results for input(s): CHOL, HDL, LDLCALC, TRIG, CHOLHDL, LDLDIRECT in the last 72 hours. Thyroid Function Tests: No results for input(s): TSH, T4TOTAL, FREET4, T3FREE, THYROIDAB in the last 72 hours. Anemia Panel:  Recent Labs  12/22/16 0711  VITAMINB12 1,066*  FERRITIN 260  TIBC 192*  IRON 11*  RETICCTPCT 2.7   Sepsis Labs:  Recent Labs Lab 12/20/16 0718 12/20/16 1128  LATICACIDVEN 0.58 0.47*    Recent Results (from the past 240 hour(s))  Blood culture (routine x 2)     Status: None (Preliminary result)   Collection Time: 12/20/16  7:03 AM  Result Value Ref Range Status   Specimen Description BLOOD RIGHT ANTECUBITAL  Final   Special Requests   Final    BOTTLES DRAWN AEROBIC AND ANAEROBIC Blood Culture adequate volume   Culture NO GROWTH 1 DAY  Final   Report Status PENDING  Incomplete  Blood culture (routine x 2)     Status: None (Preliminary result)   Collection Time: 12/20/16  7:45 AM  Result Value Ref Range Status   Specimen Description BLOOD RIGHT WRIST  Final   Special Requests   Final    BOTTLES DRAWN AEROBIC AND ANAEROBIC Blood Culture adequate volume   Culture NO GROWTH 1 DAY  Final   Report Status PENDING  Incomplete         Radiology Studies: Ct Abdomen Pelvis Wo Contrast  Result Date: 12/20/2016 CLINICAL DATA:  76 year old male with flank pain, nausea vomiting. Personal history of myeloproliferative disease. EXAM: CT ABDOMEN AND PELVIS WITHOUT CONTRAST TECHNIQUE: Multidetector CT imaging of the abdomen and pelvis was performed following the standard protocol without IV contrast. COMPARISON:  CT Abdomen and Pelvis 04/14/2016 FINDINGS: Lower chest: Chronic mosaic attenuation at the lung bases, favor due to gas trapping. Superimposed small bilateral layering pleural effusions, chronic elevation of the right hemidiaphragm, and chronic posterior basal segment right lower lobe atelectasis.  Cardiomegaly. No pericardial effusion. Hepatobiliary: Chronic elevation the right hemidiaphragm. Negative noncontrast liver and gallbladder. Pancreas: Negative noncontrast pancreas. Spleen: Chronic splenomegaly is stable. Adrenals/Urinary Tract: Normal adrenal glands. Increased bilateral renal collecting system size compatible with mild to moderate hydronephrosis. Associated bilateral hydroureter with tortuous bilateral ureters. No urologic calculus identified, but the urinary bladder is both abnormally distended and multi loculated appearing, suggesting multiple large bladder diverticula. Overall urinary bladder volume is estimated at 1,100 mL. No gas  within the urinary bladder. Stomach/Bowel: Negative rectum aside from retained stool. Redundant sigmoid colon with severe diverticulosis. Gas and stool in the transverse colon and right colon. Oral contrast has reached the cecum. Normal appendix. Normal terminal ileum. No dilated small bowel. Negative stomach and duodenum. No definite abdominal free fluid. Vascular/Lymphatic: Vascular patency is not evaluated in the absence of IV contrast. Aortoiliac calcified atherosclerosis. No lymphadenopathy is evident. Reproductive:  Negative. Other: Mild presacral stranding.  No definite pelvic free fluid. Curvilinear area of abnormal soft tissue thickening posterior to the right sacrum is felt to be a skin fold rather than a decubitus wound. A similar finding was present in 2017 along the left side of the sacrum. Musculoskeletal: Chronic abnormal bone mineralization diffusely. Diffuse abnormal sclerosis. No superimposed acute osseous abnormality identified. IMPRESSION: 1. Moderate bilateral hydronephrosis and hydroureter appears secondary to urinary bladder distension / bladder outlet obstruction. Enlarged urinary bladder and multifocal large bladder diverticula. Estimated total bladder volume 1.1 Liters. 2. Severe diverticulosis of the splenic flexure, no definite acute bowel  inflammation. 3. Chronic splenomegaly and abnormal bone mineralization, likely the sequelae of the chronic myeloproliferative disease. 4. Chronic small pleural effusions with lung base gas trapping and atelectasis. 5. Cardiomegaly.  Aortic Atherosclerosis (ICD10-I70.0). Electronically Signed   By: Genevie Ann M.D.   On: 12/20/2016 21:58   X-ray Chest Pa And Lateral  Result Date: 12/20/2016 CLINICAL DATA:  Fever today. No chest pain or SOB. Only right flank pain. Hx of CAD, CHF, HTN, type II diabetes mellitus. EXAM: CHEST  2 VIEW COMPARISON:  Chest x-rays dated 11/14/2016, 11/02/2016 and 05/05/2016. FINDINGS: Stable cardiomegaly. Overall cardiomediastinal silhouette is grossly stable. Central pulmonary vascular congestion and bilateral interstitial prominence, presumed edema, similar to previous exams. No confluent opacity to suggest a developing pneumonia. No pleural effusion or pneumothorax seen. Stable scoliosis.  No acute appearing osseous abnormality. IMPRESSION: 1. Cardiomegaly with central pulmonary vascular congestion and bilateral interstitial prominence, presumed interstitial edema, suggesting CHF/volume overload. This appearance is not significantly changed compared to previous exams suggesting chronic CHF. 2. No evidence of pneumonia seen. Electronically Signed   By: Franki Cabot M.D.   On: 12/20/2016 18:31   Dg Lumbar Spine Complete  Result Date: 12/20/2016 CLINICAL DATA:  R flank pain with chills and dysuria since yesterday; EMS reports tender to palpation Mild lower back pain - mostly right flank says patient x 2 days. No injury. EXAM: LUMBAR SPINE - COMPLETE 4+ VIEW COMPARISON:  CT abdomen dated 04/14/2016. FINDINGS: Lumbar spine is diffusely sclerotic, similar to the appearance on earlier CT. No fracture line or displaced fracture fragment identified. Mild disc desiccations at L4-5 and L5-S1. Visualized paravertebral soft tissues are unremarkable. IMPRESSION: 1. Osseous structures, lumbar spine  and visualized portions of the osseous pelvis, are diffusely sclerotic, similar to the appearance on earlier CT, presumably diffuse metastatic disease (known primary cancer?). 2. No acute findings.  No osseous fracture or dislocation. Electronically Signed   By: Franki Cabot M.D.   On: 12/20/2016 16:03   Dg Hip Unilat W Or Wo Pelvis 2-3 Views Right  Result Date: 12/20/2016 CLINICAL DATA:  R flank pain with chills and dysuria since yesterday; EMS reports tender to palpation Mild lower back pain - mostly right flank says patient x 2 days. No injury. EXAM: DG HIP (WITH OR WITHOUT PELVIS) 2-3V RIGHT COMPARISON:  CT abdomen dated 04/14/2016. FINDINGS: Osseous structures of the pelvis and proximal femurs are diffusely sclerotic, similar to the appearance on earlier CT. No fracture line  or displaced fracture fragment identified. No significant degenerative change at either hip joint or within the lower lumbar spine. Soft tissues about the pelvis and right hip are unremarkable. IMPRESSION: 1. Osseous structures of the pelvis and right hip are diffusely sclerotic, similar to the appearance on earlier CT, presumably osseous metastasis (Known primary cancer?). 2. No acute findings.  No osseous fracture or dislocation seen. Electronically Signed   By: Franki Cabot M.D.   On: 12/20/2016 16:05      Scheduled Meds: . aspirin  325 mg Oral Daily  . atorvastatin  40 mg Oral Daily  . B-complex with vitamin C  1 tablet Oral Daily  . carvedilol  3.125 mg Oral BID WC  . colchicine  0.6 mg Oral Daily  . enoxaparin (LOVENOX) injection  40 mg Subcutaneous Q24H  . feeding supplement (GLUCERNA SHAKE)  118.5 mL Oral Daily  . fluticasone  2 spray Each Nare Daily  . furosemide  20 mg Intravenous Once  . gabapentin  200 mg Oral BID  . hydrALAZINE  50 mg Oral TID  . isosorbide mononitrate  30 mg Oral BID  . methocarbamol  750 mg Oral TID  . metolazone  2.5 mg Oral Daily  . nystatin-triamcinolone ointment   Topical TID  .  potassium chloride SA  20 mEq Oral Daily  . tamsulosin  0.4 mg Oral Daily  . torsemide  80 mg Oral Daily   Continuous Infusions: . sodium chloride    . cefTRIAXone (ROCEPHIN)  IV Stopped (12/21/16 2033)     LOS: 1 day     Mitzie Na, PA-S Pager: Text Page via [email or web address]  [phone]  If 7PM-7AM, please contact night-coverage [email or web address] Password [email or web address] 12/22/2016, 10:28 AM

## 2016-12-22 NOTE — Consult Note (Addendum)
Referral MD  Reason for Referral:   bony abnormalities, anemia, myeloproliferative disorder   Chief Complaint  Patient presents with  . Flank Pain  . Chills  . Dysuria  : I'm glad to see you again, Dr. Marin Olp  HPI:  Juan [name] is a nice 76 year old African-American male. I have not seen him for about 2 years. He has this undefined underlying myeloproliferative disorder. He has never allowed [place] to do a workup for this. I think the last time we saw him was back in 2016.  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  He was admitted a couple days ago. He is having acute pain over on the right side. This was not radiating. It was not associated with any nausea or vomiting. He had no obvious change in bowel or bladder habits. He currently has a Foley catheter and a because of urinary retention.  When he came in, his white cell count was 8.4. Hemoglobin 8.5. Platelet count 333,000. Yesterday, the white cell count was 8.4. Hemoglobin 8 and platelet count 329,000.  Blood cultures were done. These were negative. He is on some antibiotic.  Electrolytes were pretty much unremarkable. He had no renal failure. Potassium was okay.  He had a CT of the abdomen and pelvis. This is done on 12/20/2016. This showed moderate bilateral hydronephrosis. There appeared to be some bladder outlet obstruction. He had diverticulosis. There is chronic splenomegaly. There was abnormal bone demineralization. He has some cardiomegaly.  We are asked to see him to help with the hematologic issues.  He says he has not lost weight. He says his appetite is okay. He's had some rashes he says. He's had no swollen nodes. He's had no headache. He has not had any obvious fever.  Overall, I said his performance status is ECOG 1.   Past Medical History:  Diagnosis Date  . Allergic rhinitis   . Anemia   . Ascending aortic aneurysm ([place]) 03/2014   4.3cm on CT scan  . CAD (coronary  artery disease)    dx elsewheer in past, no documentation. Non-ischemic myoview 2007  . Chronic diastolic CHF (congestive heart failure), NYHA class 2 (H[facility])    Normal EF w/ grade 1 dd by echo 12/2015  . Edema    R>L leg, u/s 5-12 neg for DVT  . Hemorrhoid   . History of thrombocytosis   . Hypertension   . Migraine    "once/wk at least" (07/11/2013)  . Myeloproliferative disease ([place])   . Shortness of breath   . Sinus congestion   . Sleep apnea, obstructive    at some point used CPAP, was d/c  years ago  . Type II diabetes mellitus ([place])   :  Past Surgical History:  Procedure Laterality Date  . TOE SURGERY Right    "tried to straighten out big toe" (07/11/2013)  :   Current Facility-Administered Medications:  .  acetaminophen (TYLENOL) tablet 650 mg, 650 mg, Oral, Q6H PRN, Rosita Fire, MD .  aspirin tablet 325 mg, 325 mg, Oral, Daily, Rosita Fire, MD, 325 mg at 12/21/16 1016 .  atorvastatin (LIPITOR) tablet 40 mg, 40 mg, Oral, Daily, Rosita Fire, MD, 40 mg at 12/21/16 1015 .  B-complex with vitamin C tablet 1 tablet, 1 tablet, Oral, Daily, Rosita Fire, MD, 1 tablet at 12/21/16 1016 .  bisacodyl (DULCOLAX) suppository 10 mg, 10 mg, Rectal, Daily PRN, Rosita Fire, MD .  carvedilol (COREG) tablet 3.125 mg, 3.125 mg, Oral, BID WC, Rosita Fire, MD, 3.125 mg at 12/21/16 1738 .  cefTRIAXone (ROCEPHIN) 1 g in dextrose 5 % 50 mL IVPB, 1 g, Intravenous, Q24H, Rosita Fire, MD, Stopped at 12/21/16 2033 .  colchicine tablet 0.6 mg, 0.6 mg, Oral, Daily, Rosita Fire, MD, 0.6 mg at 12/21/16 1016 .  diclofenac sodium (VOLTAREN) 1 % transdermal gel 4 g, 4 g, Topical, BID PRN, Rosita Fire, MD .  enoxaparin (LOVENOX) injection 40 mg, 40 mg, Subcutaneous, Q24H, Rosita Fire, MD, 40 mg at 12/21/16 2133 .  feeding supplement (GLUCERNA SHAKE) (GLUCERNA SHAKE) liquid 118.5 mL, 118.5 mL, Oral, Daily, Rosita Fire, MD, 118.5 mL at 12/21/16 1026 .  ferrous gluconate (FERGON) tablet 324 mg, 324 mg, Oral, BID WC, Rosita Fire, MD, 324 mg at 12/21/16 1738 .  fluticasone (FLONASE) 50 MCG/ACT nasal spray 2 spray, 2 spray, Each Nare, Daily, Rosita Fire, MD, 2 spray at 12/21/16 1018 .  gabapentin (NEURONTIN) capsule 200 mg, 200 mg, Oral, BID, Rosita Fire, MD, 200 mg at 12/21/16 2134 .  hydrALAZINE (APRESOLINE) tablet 50 mg, 50 mg, Oral, TID, Rosita Fire, MD, 50 mg at 12/21/16 2134 .  HYDROcodone-acetaminophen (NORCO/VICODIN) 5-325 MG per tablet 1-2 tablet, 1-2 tablet, Oral, Q6H PRN, Rosita Fire, MD, 2 tablet at 12/21/16 2009 .  isosorbide mononitrate (IMDUR) 24 hr tablet 30 mg, 30 mg, Oral, BID, Rosita Fire, MD, 30 mg at 12/21/16 2132 .  methocarbamol (ROBAXIN) tablet 750 mg, 750 mg, Oral, TID, Rosita Fire, MD, 750 mg at 12/21/16 2134 .  metolazone (ZAROXOLYN) tablet 2.5 mg, 2.5 mg, Oral, Daily, Rosita Fire, MD, 2.5 mg at 12/21/16 1015 .  nystatin-triamcinolone ointment (MYCOLOG), , Topical, TID, Irine Seal, MD .  ondansetron [facility]) tablet 4 mg, 4 mg, Oral, Q6H PRN **OR** ondansetron (ZOFRAN) injection 4 mg, 4 mg, Intravenous, Q6H PRN, Rosita Fire, MD .  polyethylene glycol (MIRALAX / GLYCOLAX) packet 17 g, 17 g, Oral, Daily PRN, Rosita Fire, MD .  potassium chloride SA (K-DUR,KLOR-CON) CR tablet 20 mEq, 20 mEq, Oral, Daily, Rosita Fire, MD, 20 mEq at 12/21/16 1017 .  simethicone (MYLICON) chewable tablet 80 mg, 80 mg, Oral, Q6H PRN, Rosita Fire, MD .  tamsulosin [facility]) capsule 0.4 mg, 0.4 mg, Oral, Daily, Rosita Fire, MD, 0.4 mg at 12/21/16 1443 .  torsemide (DEMADEX) tablet 80 mg, 80 mg, Oral, Daily, Rosita Fire, MD, 80 mg at 12/21/16 1015:  . aspirin  325 mg Oral Daily  . atorvastatin  40 mg Oral Daily  . B-complex with vitamin C  1 tablet Oral Daily  .  carvedilol  3.125 mg Oral BID WC  . colchicine  0.6 mg Oral Daily  . enoxaparin (LOVENOX) injection  40 mg Subcutaneous Q24H  . feeding supplement (GLUCERNA SHAKE)  118.5 mL Oral Daily  . ferrous gluconate  324 mg Oral BID WC  . fluticasone  2 spray Each Nare Daily  . gabapentin  200 mg Oral BID  . hydrALAZINE  50 mg Oral TID  . isosorbide mononitrate  30 mg Oral BID  . methocarbamol  750 mg Oral TID  . metolazone  2.5 mg Oral Daily  . nystatin-triamcinolone ointment   Topical TID  . potassium chloride SA  20 mEq Oral Daily  . tamsulosin  0.4 mg Oral Daily  . torsemide  80 mg Oral Daily  :  No Known Allergies:  Family History  Problem Relation Age of Onset  . Schizophrenia Son   . Colon cancer Neg Hx   . Prostate cancer Neg Hx   . Heart attack Neg Hx   . Diabetes Neg Hx   :  Social History   Social History  . Marital status: Widowed    Spouse name: N/A  . Number of children: 2  . Years of education: N/A   Occupational History  . retired    .  Retired   Social History Main Topics  . Smoking status: Former Smoker    Packs/day: 0.25    Years: 12.00    Types: Cigarettes    Quit date: 05/18/1966  . Smokeless tobacco: Never Used     Comment: quit smoking 45 years ago  . Alcohol use No  . Drug use: No  . Sexual activity: Yes   Other Topics Concern  . Not on file   Social History Narrative   Moved from [place] 2006   Widow 2007   Son lives with him   :  Pertinent items are noted in HPI.  Exam: Patient Vitals for the past 24 hrs:  BP Temp Temp src Pulse Resp SpO2  12/22/16 0505 (!) 141/64 98.5 F (36.9 C) Oral 95 18 98 %  12/21/16 2122 110/64 98 F (36.7 C) Oral 83 18 100 %  12/21/16 1738 118/68 - - 80 - -  12/21/16 1310 (!) 108/59 98.3 F (36.8 C) Oral 62 20 98 %  12/21/16 1014 110/61 - - 85 - -    As above    Recent Labs  12/20/16 0613 23/76/28 3151  WBC DUPLICATE  9.4 8.4  HGB DUPLICATE  8.5* 8.0*  HCT DUPLICATE  76.1* 60.7*  PLT DUPLICATE   371 062    Recent Labs  12/20/16 0613 12/21/16 0723  NA 140 139  K 4.0 4.7  CL 110 110  CO2 24 24  GLUCOSE 101* 95  BUN 26* 27*  CREATININE 0.92 1.16  CALCIUM 9.5 9.2    Blood smear review:  Not done  Pathology: None     Assessment and Plan:  Juan [name] is a 76 year old African-American male. He has this chronic myeloproliferative disorder. Again, his never allowed [place] to do an evaluation for this.  He now is more willing to do an evaluation. I think the only evaluation he really needs is a bone marrow test. I don't know if he is transforming over to acute leukemia. No blood smear has been done. I will have to see if one can be done so we can look at it.  I cannot feel his spleen.   He has no obvious adenopathy.  He agrees to have a bone marrow test done. We will see if this can be done by radiology tomorrow.  I also think he would benefit from a transfusion. I [place] was causing this right-sided pain. I would think that if anything, he would have left sided pain because of splenomegaly. However, I cannot feel his spleen on exam.  I will order an LDH.  Again, I think the bone marrow test will clearly show [place] what is going on. I'm not sure what to make of the CT scan with the bone abnormalities. Maybe, he needs a bone scan to see what is going on.  It was nice to see him again. He is always so nice. However, he just has been reluctant to undergo any evaluation. He now is  more amenable to this.  We will continue pray hard for him.  I know that all of the staff up on [facility] will give him outstanding care.   Lattie Haw, MD  [place] 4:4

## 2016-12-22 NOTE — Evaluation (Signed)
Occupational Therapy Evaluation Patient Details Name: Juan [name] MRN: [id number] DOB: 1941/05/15 Today's Date: 12/22/2016    History of Present Illness Patient is a 76 yo male admitted 12/20/16 with worsening Rt hip and flank pain, and UTI.  X-ray showed a diffuse sclerotic disease, possible osseous mets.    PMH:  CAD, CHF, history of myeloproliferative disease, chronic anemia, chronic respiratory failure with hypoxia on 2 L of oxygen at home, HTN, DM, gout.     Clinical Impression   Pt was assisted for self care and all IADL prior to admission. He walks with a cane and uses a w/c for longer distances. Presents with generalized weakness. Limited evaluation as pt was insistent upon eating his breakfast before getting OOB. Pt requires min to total assist for ADL and needs mod to max assist for bed mobility. Will follow acutely. Anticipate pt will need post acute rehab in SNF.    Follow Up Recommendations  SNF;Supervision/Assistance - 24 hour    Equipment Recommendations       Recommendations for Other Services       Precautions / Restrictions Precautions Precautions: Fall Restrictions Weight Bearing Restrictions: No      Mobility Bed Mobility Overal bed mobility: Needs Assistance Bed Mobility: Supine to Sit;Sit to Supine     Supine to sit: Max assist;HOB elevated Sit to supine: Mod assist;HOB elevated   General bed mobility comments: assist of pad for hips to EOB and to raise trunk, assist for LEs back into bed  Transfers                 General transfer comment: pt declined    Balance Overall balance assessment: Needs assistance   Sitting balance-Leahy Scale: Fair                                     ADL either performed or assessed with clinical judgement   ADL Overall ADL's : Needs assistance/impaired Eating/Feeding: Set up;Bed level   Grooming: Wash/dry hands;Wash/dry face;Bed level;Set up   Upper Body Bathing: Minimal assistance;Sitting    Lower Body Bathing: Total assistance;Bed level   Upper Body Dressing : Minimal assistance;Sitting   Lower Body Dressing: Total assistance;Sit to/from stand                 General ADL Comments: Pt refused OOB to chair, insisting upon eating breakfast first in bed.     Vision Patient Visual Report: No change from baseline       Perception     Praxis      Pertinent Vitals/Pain Pain Assessment: Faces Faces Pain Scale: Hurts little more Pain Location: R hip Pain Descriptors / Indicators: Aching;Sore Pain Intervention(s): Monitored during session;Repositioned     Hand Dominance Right   Extremity/Trunk Assessment Upper Extremity Assessment Upper Extremity Assessment: RUE deficits/detail;LUE deficits/detail RUE Deficits / Details: 3/5 shoulder, 4/5 elbow, 3/5 gross grasp RUE Coordination: decreased fine motor;decreased gross motor LUE Deficits / Details: 3/5 shoulder, 4/5 elbow, 3+/5 hand, wrist in splint LUE: Unable to fully assess due to immobilization LUE Coordination: decreased fine motor;decreased gross motor   Lower Extremity Assessment Lower Extremity Assessment: Defer to PT evaluation       Communication Communication Communication: No difficulties   Cognition Arousal/Alertness: Awake/alert Behavior During Therapy: Anxious;Flat affect Overall Cognitive Status: No family/caregiver present to determine baseline cognitive functioning  General Comments: pt very focused on wanting to eat   General Comments       Exercises     Shoulder Instructions      Home Living Family/patient expects to be discharged to:: Private residence Living Arrangements: Children (son) Available Help at Discharge: Family;Available 24 hours/day Type of Home: Apartment Home Access: Level entry     Home Layout: One level     Bathroom Shower/Tub: Teacher, early years/pre: Standard     Home Equipment: Environmental consultant - 2  wheels;Cane - single point;Shower seat;Bedside commode;Wheelchair - manual          Prior Functioning/Environment Level of Independence: Needs assistance  Gait / Transfers Assistance Needed: Ambulates with cane ADL's / Homemaking Assistance Needed: Pt's son performs housekeeping and cooking. Pt states his family assists him with sponge bathing and dressing. He can self feed and toilet without assist.   Comments: recent L wrist surgery, in a velcro wrist cock up splint        OT Problem List: Decreased strength;Decreased activity tolerance;Impaired balance (sitting and/or standing);Decreased coordination;Decreased cognition;Decreased safety awareness;Decreased knowledge of use of DME or AE;Pain;Impaired UE functional use      OT Treatment/Interventions: Self-care/ADL training;DME and/or AE instruction;Patient/family education;Balance training;Therapeutic activities    OT Goals(Current goals can be found in the care plan section) Acute Rehab OT Goals Patient Stated Goal: To decrease pain OT Goal Formulation: With patient Time For Goal Achievement: 01/05/17 Potential to Achieve Goals: Good ADL Goals Pt Will Perform Grooming: with supervision;sitting Pt Will Perform Upper Body Bathing: with min assist;sitting Pt Will Perform Upper Body Dressing: with supervision;sitting Pt Will Transfer to Toilet: with min assist;ambulating;bedside commode Pt Will Perform Toileting - Clothing Manipulation and hygiene: with min assist;sit to/from stand Additional ADL Goal #1: Pt will perform bed mobility with min assist in preparation for ADL.  OT Frequency: Min 2X/week   Barriers to D/C:            Co-evaluation              AM-PAC PT "6 Clicks" Daily Activity     Outcome Measure Help from another person eating meals?: A Little Help from another person taking care of personal grooming?: A Little Help from another person toileting, which includes using toliet, bedpan, or urinal?: A  Lot Help from another person bathing (including washing, rinsing, drying)?: A Lot Help from another person to put on and taking off regular upper body clothing?: A Little Help from another person to put on and taking off regular lower body clothing?: Total 6 Click Score: 14   End of Session Equipment Utilized During Treatment: Oxygen  Activity Tolerance: Patient tolerated treatment well Patient left: in bed;with call [place]/phone within reach  OT Visit Diagnosis: Muscle weakness (generalized) (M62.81)                Time: 4580-9983 OT Time Calculation (min): 17 min Charges:  OT General Charges $OT Visit: 1 Procedure OT Evaluation $OT Eval Moderate Complexity: 1 Procedure G-Codes:      Malka So 12/22/2016, 10:21 AM  [phone]

## 2016-12-23 ENCOUNTER — Inpatient Hospital Stay (HOSPITAL_COMMUNITY): Payer: Medicare Other

## 2016-12-23 DIAGNOSIS — N1330 Unspecified hydronephrosis: Secondary | ICD-10-CM

## 2016-12-23 DIAGNOSIS — N179 Acute kidney failure, unspecified: Secondary | ICD-10-CM

## 2016-12-23 DIAGNOSIS — D509 Iron deficiency anemia, unspecified: Secondary | ICD-10-CM

## 2016-12-23 DIAGNOSIS — N401 Enlarged prostate with lower urinary tract symptoms: Secondary | ICD-10-CM

## 2016-12-23 DIAGNOSIS — R338 Other retention of urine: Secondary | ICD-10-CM

## 2016-12-23 DIAGNOSIS — I5032 Chronic diastolic (congestive) heart failure: Secondary | ICD-10-CM

## 2016-12-23 DIAGNOSIS — N189 Chronic kidney disease, unspecified: Secondary | ICD-10-CM

## 2016-12-23 LAB — CBC
HCT: 32.3 % — ABNORMAL LOW (ref 39.0–52.0)
Hemoglobin: 9.9 g/dL — ABNORMAL LOW (ref 13.0–17.0)
MCH: 25.9 pg — AB (ref 26.0–34.0)
MCHC: 30.7 g/dL (ref 30.0–36.0)
MCV: 84.6 fL (ref 78.0–100.0)
PLATELETS: 394 10*3/uL (ref 150–400)
RBC: 3.82 MIL/uL — ABNORMAL LOW (ref 4.22–5.81)
RDW: 19.6 % — AB (ref 11.5–15.5)
WBC: 9 10*3/uL (ref 4.0–10.5)

## 2016-12-23 LAB — ERYTHROPOIETIN: Erythropoietin: 5.8 m[IU]/mL (ref 2.6–18.5)

## 2016-12-23 LAB — TYPE AND SCREEN
ABO/RH(D): O POS
Antibody Screen: NEGATIVE
UNIT DIVISION: 0
UNIT DIVISION: 0
UNIT DIVISION: 0

## 2016-12-23 LAB — BPAM RBC
BLOOD PRODUCT EXPIRATION DATE: 201808312359
Blood Product Expiration Date: 201808072359
Blood Product Expiration Date: 201808292359
ISSUE DATE / TIME: 201808072146
ISSUE DATE / TIME: 201808071647
ISSUE DATE / TIME: 201808071654
UNIT TYPE AND RH: 5100
UNIT TYPE AND RH: 5100
UNIT TYPE AND RH: 9500

## 2016-12-23 LAB — BASIC METABOLIC PANEL
Anion gap: 10 (ref 5–15)
BUN: 41 mg/dL — AB (ref 6–20)
CHLORIDE: 105 mmol/L (ref 101–111)
CO2: 22 mmol/L (ref 22–32)
CREATININE: 1.66 mg/dL — AB (ref 0.61–1.24)
Calcium: 9.1 mg/dL (ref 8.9–10.3)
GFR calc Af Amer: 45 mL/min — ABNORMAL LOW (ref >60)
GFR calc non Af Amer: 39 mL/min — ABNORMAL LOW (ref >60)
Glucose, Bld: 101 mg/dL — ABNORMAL HIGH (ref 65–99)
Potassium: 5.2 mmol/L — ABNORMAL HIGH (ref 3.5–5.1)
Sodium: 137 mmol/L (ref 135–145)

## 2016-12-23 MED ORDER — LIDOCAINE HCL (PF) 1 % IJ SOLN
INTRAMUSCULAR | Status: AC
  Filled 2016-12-23: qty 30

## 2016-12-23 MED ORDER — CEFUROXIME AXETIL 250 MG PO TABS
250.0000 mg | ORAL_TABLET | Freq: Two times a day (BID) | ORAL | Status: DC
  Administered 2016-12-23 – 2016-12-24 (×2): 250 mg via ORAL
  Filled 2016-12-23 (×3): qty 1

## 2016-12-23 MED ORDER — SODIUM CHLORIDE 0.9 % IV SOLN
510.0000 mg | Freq: Once | INTRAVENOUS | Status: AC
  Administered 2016-12-23: 510 mg via INTRAVENOUS
  Filled 2016-12-23: qty 17

## 2016-12-23 MED ORDER — FENTANYL CITRATE (PF) 100 MCG/2ML IJ SOLN
INTRAMUSCULAR | Status: AC | PRN
  Administered 2016-12-23: 50 ug via INTRAVENOUS

## 2016-12-23 MED ORDER — FENTANYL CITRATE (PF) 100 MCG/2ML IJ SOLN
INTRAMUSCULAR | Status: AC
  Filled 2016-12-23: qty 2

## 2016-12-23 NOTE — Sedation Documentation (Signed)
Patient is resting comfortably. 

## 2016-12-23 NOTE — Procedures (Signed)
Interventional Radiology Procedure Note  Procedure: CT guided aspirate and core biopsy of right iliac bone Complications: None Recommendations: - Bedrest supine x 1 hrs - Hydrocodone PRN  Pain - Follow biopsy results  Signed,  Shaquon Gropp K. Dacoda Spallone, MD   

## 2016-12-23 NOTE — Progress Notes (Signed)
Mr. Joost is going down for his bone marrow test today.  He did have his bone scan yesterday. This showed diffuse bony activity throughout his skeleton. 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 The bone marrow test will show [place] this.  He got 2 units of blood yesterday. He feels a little bit better.  He does have the hydronephrosis. He still has a Foley catheter in. I'm not sure how urology will address this.  His labs show that he is iron deficient. His ferritin is 260 with an iron saturation of only 6%. His vitamin B-12 was 1066. His LDH was quite high at 700.  His erythropoietin level is only 5.8. This certainly can go along with a myeloproliferative disorder. Typically, we see this low of an erythropoietin level with polycythemia. I doubt that he has polycythemia unless he just is anemic from severe iron deficiency.  Today, his hemoglobin is 9.9. I suspect that this will improve.  On his physical exam, his blood pressure is 126/70. Temperature 98.2. Pulse is 89. There really is no overall change.  Again, the bone marrow biopsy will show [place] what is going on with his bone marrow. He has some kind of myeloproliferative problem.  We will give him IV iron today.  I would think that the limiting problem with him going home as this Foley catheter and this bladder outlet issue.  He is very happy with the wonderful care that he is getting from all the staff upon 5W.  Lattie Haw, MD  Psalm 16:8

## 2016-12-23 NOTE — Progress Notes (Signed)
PROGRESS NOTE    Juan [name]   ZRA:[id number]  DOB: November 20, 1940  DOA: 12/20/2016 PCP: Colon Branch, MD   Brief Narrative:  Juan [name] 76 y.o.malewith medical history significant of coronary artery disease, chronic diastolic congestive heart failure on diuretics, history of myeloproliferative disease did not follow up with oncologist, chronic anemia, chronic respiratory failure with hypoxia on 2 L of oxygen at home, gout presented with worsening right hip and flank pain. Patient is a poor historian.Right hip x-ray consistent with a diffuse sclerotic disease which is similar to prior CT scan finding. CT scan of abdomen and pelvis consistent with bilateral moderate hydronephrosis, hydroureter and bladder distention. On antibiotics for possible UTI.    Subjective: Just returned from IR. Having mild pain at the site of biopsy. No other complaints. Plans for going home to live with son.  ROS: no complaints of nausea, vomiting, constipation diarrhea, cough, dyspnea or dysuria. No other complaints.   Assessment & Plan:   Principal Problem:   Myeloproliferative disease - not a new problem but has not been seen in 2 yrs and never allowed a work up - elevated LDH, splenomegaly - 8/6- 2 units of red blood cell transfusion by oncologist - bone scan noted below - right iliac crest biopsy today- Dr Marin Olp following  Active Problems:   Acute kidney injury superimposed on CKD /   BPH (benign prostatic hyperplasia)     CKD (chronic kidney disease) stage 3, GFR 30-59 ml/min - cont foley 1-2 wks and Flomax -  will need to f/u with Dr Roni Bread    Essential hypertension   Chronic diastolic CHF (congestive heart failure)  - Coreg, Hydralazaine, Imdur     UTI (urinary tract infection)? In setting of U retention - Rocephin >> Ceftin stop date 8/11 - cultures unhelpful as is it growing multiple morphotypes   Chronic respiratory failure with hypoxia - On 2 L of oxygen  Gout - colchicine  DVT  prophylaxis: Lovenox Code Status: Full code Family Communication:  Disposition Plan: home tomorrow if stable Consultants:   Oncology  Urology  Procedures:   Bone marrow biopsy Antimicrobials:  Anti-infectives    Start     Dose/Rate Route Frequency Ordered Stop   12/23/16 1700  cefUROXime (CEFTIN) tablet 250 mg     250 mg Oral 2 times daily with meals 12/23/16 1008     12/20/16 1900  cefTRIAXone (ROCEPHIN) 1 g in dextrose 5 % 50 mL IVPB  Status:  Discontinued     1 g 100 mL/hr over 30 Minutes Intravenous Every 24 hours 12/20/16 1736 12/23/16 1008       Objective: Vitals:   12/23/16 0926 12/23/16 0930 12/23/16 0935 12/23/16 0940  BP: (!) 148/91 (!) 155/84 (!) 149/86 (!) 160/86  Pulse: 95 97 94 98  Resp: _0 Temp:      TempSrc:      SpO2: (!) 87% 92% 93% 93%  Weight:      Height:        Intake/Output Summary (Last 24 hours) at 12/23/16 1134 Last data filed at 12/23/16 0811  Gross per 24 hour  Intake           656.17 ml  Output             1950 ml  Net         -1293.83 ml   Filed Weights   12/20/16 2030  Weight: 90.5 kg (199 lb 9.6 oz)    Examination:  General exam: Appears comfortable  HEENT: PERRLA, oral mucosa moist, no sclera icterus or thrush Respiratory system: Clear to auscultation. Respiratory effort normal. Cardiovascular system: S1 & S2 heard, RRR.  No murmurs  Gastrointestinal system: Abdomen soft, non-tender, nondistended. Normal bowel sound. No organomegaly Central nervous system: Alert and oriented. No focal neurological deficits. Extremities: No cyanosis, clubbing or edema Skin: No rashes or ulcers Psychiatry:  Mood & affect appropriate.     Data Reviewed: I have personally reviewed following labs and imaging studies  CBC:  Recent Labs Lab 12/20/16 0613 12/21/16 0723 12/22/16 0711 56/43/32 9518  WBC DUPLICATE  9.4 8.4 8.0 9.0  NEUTROABS 5.0  --  5.4  --   HGB DUPLICATE  8.5* 8.0* 8.7* 9.9*  HCT DUPLICATE  84.1* 66.0*  63.0* 16.0*  MCV DUPLICATE  10.9 32.3 55.7 32.2  PLT DUPLICATE  025 427 062 376   Basic Metabolic Panel:  Recent Labs Lab 12/20/16 0613 12/21/16 0723 12/22/16 0711 12/23/16 0334  NA 140 139 139 137  K 4.0 4.7 5.3* 5.2*  CL 110 110 107 105  CO2 _0 GLUCOSE 101* 95 99 101*  BUN 26* 27* 34* 41*  CREATININE 0.92 1.16 1.47* 1.66*  CALCIUM 9.5 9.2 9.2 9.1   GFR: Estimated Creatinine Clearance: 46 mL/min (A) (by C-G formula based on SCr of 1.66 mg/dL (H)). Liver Function Tests:  Recent Labs Lab 12/22/16 0711  AST 23  ALT 11*  ALKPHOS 118  BILITOT 0.6  PROT 5.7*  ALBUMIN 2.7*   No results for input(s): LIPASE, AMYLASE in the last 168 hours. No results for input(s): AMMONIA in the last 168 hours. Coagulation Profile:  Recent Labs Lab 12/22/16 0711  INR 1.22   Cardiac Enzymes:  Recent Labs Lab 12/20/16 0613  CKTOTAL 10*   BNP (last 3 results) No results for input(s): PROBNP in the last 8760 hours. HbA1C: No results for input(s): HGBA1C in the last 72 hours. CBG: No results for input(s): GLUCAP in the last 168 hours. Lipid Profile: No results for input(s): CHOL, HDL, LDLCALC, TRIG, CHOLHDL, LDLDIRECT in the last 72 hours. Thyroid Function Tests: No results for input(s): TSH, T4TOTAL, FREET4, T3FREE, THYROIDAB in the last 72 hours. Anemia Panel:  Recent Labs  12/22/16 0711  VITAMINB12 1,066*  FERRITIN 260  TIBC 192*  IRON 11*  RETICCTPCT 2.7   Urine analysis:    Component Value Date/Time   COLORURINE YELLOW 12/20/2016 0703   APPEARANCEUR CLEAR 12/20/2016 0703   LABSPEC 1.020 12/20/2016 0703   PHURINE 6.0 12/20/2016 0703   GLUCOSEU NEGATIVE 12/20/2016 0703   GLUCOSEU NEGATIVE 05/02/2015 1008   HGBUR TRACE (A) 12/20/2016 0703   BILIRUBINUR NEGATIVE 12/20/2016 0703   KETONESUR NEGATIVE 12/20/2016 0703   PROTEINUR >300 (A) 12/20/2016 0703   UROBILINOGEN 0.2 05/02/2015 1008   NITRITE NEGATIVE 12/20/2016 0703   LEUKOCYTESUR MODERATE (A)  12/20/2016 0703   Sepsis Labs: _1 (procalcitonin:4,lacticidven:4) ) Recent Results (from the past 240 hour(s))  Blood culture (routine x 2)     Status: None (Preliminary result)   Collection Time: 12/20/16  7:03 AM  Result Value Ref Range Status   Specimen Description BLOOD RIGHT ANTECUBITAL  Final   Special Requests   Final    BOTTLES DRAWN AEROBIC AND ANAEROBIC Blood Culture adequate volume   Culture NO GROWTH 2 DAYS  Final   Report Status PENDING  Incomplete  Culture, Urine     Status: Abnormal   Collection Time: 12/20/16  7:03 AM  Result Value Ref Range Status   Specimen Description URINE, RANDOM  Final   Special Requests ADDED ON 12/21/16 AT 1658  Final   Culture MULTIPLE SPECIES PRESENT, SUGGEST RECOLLECTION (A)  Final   Report Status 12/22/2016 FINAL  Final  Blood culture (routine x 2)     Status: None (Preliminary result)   Collection Time: 12/20/16  7:45 AM  Result Value Ref Range Status   Specimen Description BLOOD RIGHT WRIST  Final   Special Requests   Final    BOTTLES DRAWN AEROBIC AND ANAEROBIC Blood Culture adequate volume   Culture NO GROWTH 2 DAYS  Final   Report Status PENDING  Incomplete         Radiology Studies: Nm Bone Scan Whole Body  Result Date: 12/22/2016 CLINICAL DATA:  Stage IIIA non-small cell lung cancer, post preoperative concurrent chemotherapy and radiation therapy, preoperative evaluation, history of coronary artery disease, type II diabetes mellitus, chronic diastolic CHF, myeloproliferative disease ; prior bone can indicates history of myelofibrosis. EXAM: NUCLEAR MEDICINE WHOLE BODY BONE SCAN TECHNIQUE: Whole body anterior and posterior images were obtained approximately 3 hours after intravenous injection of radiopharmaceutical. RADIOPHARMACEUTICALS:  22 mCi Technetium-39mMDP IV COMPARISON:  04/17/2016 Radiographic correlation:  CT abdomen and pelvis 12/20/2016 FINDINGS: Abnormal diffuse increased tracer localization throughout the  axial and appendicular skeleton similar to the previous scan. Prominence of uptake in the periarticular regions at the shoulders, elbows, hips, knees, and ankles. No significant increased tracer localization within the calvaria. Minimal tracer accumulation in the kidneys and bladder. Paucity of tracer within the soft tissues. Scintigraphic appearance is that of a super scan unchanged from the previous study. No definite focal areas of increased trace accumulation are discernible. IMPRESSION: Abnormal diffusely increased tracer localization throughout the axial and appendicular skeleton consistent with a super scan. 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 No definite superimposed focal areas of abnormal tracer accumulation are identified but in the setting of diffusely increased uptake, focal superimposed metastatic lesions cannot be excluded. Electronically Signed   By: MLavonia DanaM.D.   On: 12/22/2016 15:49      Scheduled Meds: . aspirin  325 mg Oral Daily  . atorvastatin  40 mg Oral Daily  . B-complex with vitamin C  1 tablet Oral Daily  . carvedilol  3.125 mg Oral BID WC  . cefUROXime  250 mg Oral BID WC  . colchicine  0.6 mg Oral Daily  . enoxaparin (LOVENOX) injection  40 mg Subcutaneous Q24H  . feeding supplement (GLUCERNA SHAKE)  118.5 mL Oral Daily  . fentaNYL      . fluticasone  2 spray Each Nare Daily  . furosemide  20 mg Intravenous Once  . gabapentin  200 mg Oral BID  . hydrALAZINE  50 mg Oral TID  . isosorbide mononitrate  30 mg Oral BID  . lidocaine (PF)      . methocarbamol  750 mg Oral TID  . nystatin-triamcinolone ointment   Topical TID  . tamsulosin  0.4 mg Oral Daily   Continuous Infusions: . ferumoxytol        LOS: 2 days    Time spent in minutes: 35    SDebbe Odea MD [facility] Pager: [email or web address] Password [email or web address]8/12/2016, 11:34 AM

## 2016-12-24 DIAGNOSIS — N3000 Acute cystitis without hematuria: Secondary | ICD-10-CM

## 2016-12-24 DIAGNOSIS — N401 Enlarged prostate with lower urinary tract symptoms: Secondary | ICD-10-CM

## 2016-12-24 DIAGNOSIS — I10 Essential (primary) hypertension: Secondary | ICD-10-CM

## 2016-12-24 DIAGNOSIS — R338 Other retention of urine: Secondary | ICD-10-CM

## 2016-12-24 DIAGNOSIS — N183 Chronic kidney disease, stage 3 (moderate): Secondary | ICD-10-CM

## 2016-12-24 LAB — COMPREHENSIVE METABOLIC PANEL
ALBUMIN: 2.6 g/dL — AB (ref 3.5–5.0)
ALK PHOS: 125 U/L (ref 38–126)
ALT: 11 U/L — ABNORMAL LOW (ref 17–63)
AST: 21 U/L (ref 15–41)
Anion gap: 10 (ref 5–15)
BILIRUBIN TOTAL: 0.7 mg/dL (ref 0.3–1.2)
BUN: 47 mg/dL — AB (ref 6–20)
CO2: 24 mmol/L (ref 22–32)
Calcium: 9.1 mg/dL (ref 8.9–10.3)
Chloride: 107 mmol/L (ref 101–111)
Creatinine, Ser: 1.47 mg/dL — ABNORMAL HIGH (ref 0.61–1.24)
GFR calc Af Amer: 52 mL/min — ABNORMAL LOW (ref >60)
GFR calc non Af Amer: 45 mL/min — ABNORMAL LOW (ref >60)
GLUCOSE: 90 mg/dL (ref 65–99)
POTASSIUM: 4.4 mmol/L (ref 3.5–5.1)
Sodium: 141 mmol/L (ref 135–145)
TOTAL PROTEIN: 5.3 g/dL — AB (ref 6.5–8.1)

## 2016-12-24 LAB — CBC WITH DIFFERENTIAL/PLATELET
BASOS PCT: 5 %
Basophils Absolute: 0.4 10*3/uL — ABNORMAL HIGH (ref 0.0–0.1)
EOS PCT: 2 %
Eosinophils Absolute: 0.1 10*3/uL (ref 0.0–0.7)
HEMATOCRIT: 29.7 % — AB (ref 39.0–52.0)
HEMOGLOBIN: 9 g/dL — AB (ref 13.0–17.0)
Lymphocytes Relative: 28 %
Lymphs Abs: 2 10*3/uL (ref 0.7–4.0)
MCH: 25.4 pg — ABNORMAL LOW (ref 26.0–34.0)
MCHC: 30.3 g/dL (ref 30.0–36.0)
MCV: 83.9 fL (ref 78.0–100.0)
MONO ABS: 1 10*3/uL (ref 0.1–1.0)
Monocytes Relative: 14 %
NEUTROS PCT: 51 %
Neutro Abs: 3.7 10*3/uL (ref 1.7–7.7)
Platelets: 381 10*3/uL (ref 150–400)
RBC: 3.54 MIL/uL — ABNORMAL LOW (ref 4.22–5.81)
RDW: 19.7 % — ABNORMAL HIGH (ref 11.5–15.5)
WBC: 7.2 10*3/uL (ref 4.0–10.5)

## 2016-12-24 MED ORDER — COLCHICINE 0.6 MG PO TABS: 0.6000 mg | ORAL_TABLET | Freq: Every day | ORAL | 0 refills | Status: DC

## 2016-12-24 MED ORDER — POLYETHYLENE GLYCOL 3350 17 G PO PACK: 17.0000 g | PACK | Freq: Every day | ORAL | 0 refills | Status: DC

## 2016-12-24 MED ORDER — HYDROCODONE-ACETAMINOPHEN 5-325 MG PO TABS: 1.0000 | ORAL_TABLET | ORAL | 0 refills | Status: DC | PRN

## 2016-12-24 MED ORDER — HYDROCODONE-ACETAMINOPHEN 5-325 MG PO TABS: 1.0000 | ORAL_TABLET | Freq: Four times a day (QID) | ORAL | 0 refills | Status: DC | PRN

## 2016-12-24 MED ORDER — TAMSULOSIN HCL 0.4 MG PO CAPS: 0.4000 mg | ORAL_CAPSULE | Freq: Every day | ORAL | 0 refills | Status: DC

## 2016-12-24 MED ORDER — HYDROCODONE-ACETAMINOPHEN 5-325 MG PO TABS: 1.0000 | ORAL_TABLET | ORAL | Status: DC | PRN

## 2016-12-24 MED ORDER — ALUM & MAG HYDROXIDE-SIMETH 200-200-20 MG/5ML PO SUSP
30.0000 mL | Freq: Once | ORAL | Status: AC
  Administered 2016-12-24: 30 mL via ORAL
  Filled 2016-12-24: qty 30

## 2016-12-24 MED ORDER — CEFUROXIME AXETIL 250 MG PO TABS: 250.0000 mg | ORAL_TABLET | Freq: Two times a day (BID) | ORAL | 0 refills | Status: DC

## 2016-12-24 NOTE — Progress Notes (Signed)
Patient was discharged home with home health by MD order; discharged instructions  review and give to patient with care notes and prescriptions; IV DIC; patient will be transported home via [place].

## 2016-12-24 NOTE — Progress Notes (Signed)
Patient will DC to: Home Anticipated DC date: 12/24/16 Family notified: Patient alerted son Transport by: PTAR 3:30pm   Per MD patient ready for DC to Home. RN, patient notified of discharge. DC packet on chart. Ambulance transport requested for patient.   CSW signing off.  Cedric Fishman, [place] Social Worker [phone]

## 2016-12-24 NOTE — Progress Notes (Signed)
CSW received consult regarding SNF. Patient was at [facility] from 7/2 to 7/24 and has used his 20 Medicare days. CSW explained to patient that he would have to pay copays ($165/day) in order to return to SNF. Patient reports that he is unable to pay that and would have to go home. CSW referred patient to [place] program for evaluation. CSW explained Medicaid application process to patient but he reported that they "get into your business and ask questions." RNCM aware.   CSW signing off.  Juan [name] LCSWA [phone]

## 2016-12-24 NOTE — Progress Notes (Signed)
PT Cancellation Note  Patient Details Name: Juan [name] MRN: [id number] DOB: 01/16/1941   Cancelled Treatment:    Reason Eval/Treat Not Completed: Patient declined, no reason specified Pt declined PT this AM due to MD telling him he needs to rest. "I have not had much sleep." Will follow up as time allows.   Marguarite Arbour A Aahil Fredin 12/24/2016, 11:11 AM Wray Kearns, PT, DPT [phone]

## 2016-12-24 NOTE — Care Management Note (Signed)
Case Management Note  Patient Details  Name: Juan [name] MRN: [id number] Date of Birth: [birth year]  Subjective/Objective:   Admitted with Myeloproliferative disease, history significant of coronary artery disease, chronic diastolic congestive heart failure, myeloproliferative disease, chronic anemia, chronic respiratory failure with hypoxia on 2 L of oxygen at home, gout . Resides with son.    Natanael Saladin (Daughter)     [phone]                PCP: [name]  Action/Plan: Plan is to d/c today. PTAR TO PROVIDE TRANSPORTATION TO HOME. Pt states son is home and will receive him from [facility].  Expected Discharge Date:  12/24/16               Expected Discharge Plan:  [place]  In-House Referral:     Discharge planning Services  CM Consult  Post Acute Care Choice:  Resumption of Svcs/PTA Provider Choice offered to:  Patient  DME Arranged:  Oxygen (active with [facility]) DME Agency:  [place]:  RN, PT, OT, Social Work, Nurse's Aide [place] Agency:  [place]  Status of Service:  Completed, signed off  If discussed at [employer] [facility] [employer], dates discussed:    Additional Comments:  Sharin Mons, RN 12/24/2016, 2:26 PM

## 2016-12-24 NOTE — Discharge Summary (Signed)
Physician Discharge Summary  Juan [name] ULG:[id number] DOB: Sep 16, 1940 DOA: 12/20/2016  PCP: Wanda Plump, MD  Admit date: 12/20/2016 Discharge date: 12/24/2016  Admitted From: home  Disposition:  home   Recommendations for Outpatient Follow-up:  1. F/u with Dr Annabell Howells for U retention 2. F/u with Dr Myna Hidalgo for bone marrow biopsy results and treatment of anemia 3. PCP to assist with addressing goals of care in presence of son  Home Health:  ordered    Discharge Condition:  stable  CODE STATUS:  Full code   Consultations:  hematology  Urology     Discharge Diagnoses:  Principal Problem:   Myeloproliferative disease (HCC) Active Problems:   UTI (urinary tract infection)   Acute kidney injury superimposed on CKD (HCC)   Right hip pain   Hypertrophy of prostate with urinary retention   Essential hypertension   Chronic diastolic CHF (congestive heart failure) (HCC)   Morbid obesity due to excess calories (HCC)   CKD (chronic kidney disease) stage 3, GFR 30-59 ml/min    Subjective: Pain in left hip and left lower back again today. Wants pain medication frequency increased. No nausea, vomiting or constipation.   Brief Summary: Juan [name] 76 y.o.malewith medical history significant of coronary artery disease, chronic diastolic congestive heart failure on diuretics, history of myeloproliferative disease did not follow up with oncologist, chronic anemia, chronic respiratory failure with hypoxia on 2 L of oxygen at home, gout presented with worsening right hip and flank pain. Patient is a poor historian.Right hip x-ray consistent with a diffuse sclerotic disease which is similar to prior CT scan finding. CT scan of abdomen and pelvis consistent with bilateral moderate hydronephrosis, hydroureter and bladder distention. On antibiotics for possible   Hospital Course:  Principal Problem:   Myeloproliferative disease - not a new problem but has not been seen in 2 yrs and never allowed a  work up - elevated LDH, splenomegaly - 8/6- 2 units of red blood cell transfusion by oncologist - bone scan noted below - right iliac crest biopsy today- Dr Myna Hidalgo following  Active Problems: Right hip and flank pain - ongoing issue for which he is on narcotics- no changes made - has significant bone demineralization and sclerosis on imaging     BPH (benign prostatic hyperplasia)  CKD (chronic kidney disease) stage 3, GFR 30-59 ml/min - cont foley 1-2 wks and Flomax -  will need to f/u with Dr Wilson Singer  Anemia with Iron deficiency    Ref. Range 12/22/2016 07:11  Iron Latest Ref Range: 45 - 182 ug/dL 11 (L)  UIBC Latest Units: ug/dL 444  TIBC Latest Ref Range: 250 - 450 ug/dL 584 (L)  Saturation Ratios Latest Ref Range: 17.9 - 39.5 % 6 (L)  Ferritin Latest Ref Range: 24 - 336 ng/mL 260    Ref. Range 12/22/2016 07:11  Vitamin B12 Latest Ref Range: 180 - 914 pg/mL 1,066 (H)   - given IV Iron by hematology    Essential hypertension   Chronic grade 1 diastolic CHF (congestive heart failure)  - Coreg, Hydralazaine, Imdur - noted to be on Demedex and Metolazone at home which are being held at this point- no CHF exacerbation     UTI (urinary tract infection)? In setting of U retention - Rocephin >> Ceftin stop date 8/11 - cultures unhelpful as is it growing multiple morphotypes   Chronic respiratory failure with hypoxia - On 2 L of oxygen  Gout - colchicine daily for prevention   Discharge Instructions  Discharge Instructions    Diet - low sodium heart healthy    Complete by:  As directed    Increase activity slowly    Complete by:  As directed      Allergies as of 12/24/2016   No Known Allergies     Medication List    STOP taking these medications   metolazone 2.5 MG tablet Commonly known as:  ZAROXOLYN   potassium chloride SA 20 MEQ tablet Commonly known as:  K-DUR,KLOR-CON   torsemide 20 MG tablet Commonly known as:  DEMADEX     TAKE these medications    acetaminophen 325 MG tablet Commonly known as:  TYLENOL Take 2 tablets (650 mg total) by mouth every 6 (six) hours as needed for mild pain, fever or headache.   aspirin 325 MG tablet Take 325 mg by mouth daily.   atorvastatin 40 MG tablet Commonly known as:  LIPITOR Take 1 tablet (40 mg total) by mouth daily.   bisacodyl 10 MG suppository Commonly known as:  DULCOLAX Place 1 suppository (10 mg total) rectally daily as needed for moderate constipation.   carvedilol 3.125 MG tablet Commonly known as:  COREG Take 1 tablet (3.125 mg total) by mouth 2 (two) times daily with a meal.   cefUROXime 250 MG tablet Commonly known as:  CEFTIN Take 1 tablet (250 mg total) by mouth 2 (two) times daily with a meal.   colchicine 0.6 MG tablet Take 1 tablet (0.6 mg total) by mouth daily. What changed:  when to take this  reasons to take this   diclofenac sodium 1 % Gel Commonly known as:  VOLTAREN Apply 4 g topically 2 (two) times daily as needed (pain). Apply to bilateral  knees   feeding supplement (GLUCERNA SHAKE) Liqd Take 118.5 mLs by mouth daily. Reported on 08/21/2015   ferrous gluconate 324 MG tablet Commonly known as:  FERGON Take 1 tablet (324 mg total) by mouth 3 (three) times daily with meals.   fluticasone 50 MCG/ACT nasal spray Commonly known as:  FLONASE Place 2 sprays into both nostrils daily. Reported on 05/02/2015   gabapentin 100 MG capsule Commonly known as:  NEURONTIN Take 2 capsules (200 mg total) by mouth 3 (three) times daily as needed (pain). Reported on 08/22/2015   hydrALAZINE 25 MG tablet Commonly known as:  APRESOLINE Take 50 mg by mouth 3 (three) times daily.   hydrocerin Crea Apply Eucerin cream to BLE Q day after bathing and roughly towel drying to remove loose skin What changed:  how much to take  how to take this  when to take this  reasons to take this  additional instructions   HYDROcodone-acetaminophen 5-325 MG tablet Commonly  known as:  NORCO/VICODIN Take 1-2 tablets by mouth every 6 (six) hours as needed for moderate pain.   isosorbide mononitrate 30 MG 24 hr tablet Commonly known as:  IMDUR Take 30 mg by mouth 2 (two) times daily.   methocarbamol 750 MG tablet Commonly known as:  ROBAXIN Take 1 tablet (750 mg total) by mouth 3 (three) times daily.   OXYGEN Inhale 2 L into the lungs continuous.   polyethylene glycol packet Commonly known as:  MIRALAX / GLYCOLAX Take 17 g by mouth daily.   simethicone 80 MG chewable tablet Commonly known as:  MYLICON Chew 80 mg by mouth every 6 (six) hours as needed for flatulence.   tamsulosin 0.4 MG Caps capsule Commonly known as:  FLOMAX Take 1 capsule (0.4 mg total) by  mouth daily.   VITAMIN B COMPLEX PO Take 100 mg by mouth daily.       No Known Allergies   Procedures/Studies:    Ct Abdomen Pelvis Wo Contrast  Result Date: 12/20/2016 CLINICAL DATA:  76 year old male with flank pain, nausea vomiting. Personal history of myeloproliferative disease. EXAM: CT ABDOMEN AND PELVIS WITHOUT CONTRAST TECHNIQUE: Multidetector CT imaging of the abdomen and pelvis was performed following the standard protocol without IV contrast. COMPARISON:  CT Abdomen and Pelvis 04/14/2016 FINDINGS: Lower chest: Chronic mosaic attenuation at the lung bases, favor due to gas trapping. Superimposed small bilateral layering pleural effusions, chronic elevation of the right hemidiaphragm, and chronic posterior basal segment right lower lobe atelectasis. Cardiomegaly. No pericardial effusion. Hepatobiliary: Chronic elevation the right hemidiaphragm. Negative noncontrast liver and gallbladder. Pancreas: Negative noncontrast pancreas. Spleen: Chronic splenomegaly is stable. Adrenals/Urinary Tract: Normal adrenal glands. Increased bilateral renal collecting system size compatible with mild to moderate hydronephrosis. Associated bilateral hydroureter with tortuous bilateral ureters. No urologic  calculus identified, but the urinary bladder is both abnormally distended and multi loculated appearing, suggesting multiple large bladder diverticula. Overall urinary bladder volume is estimated at 1,100 mL. No gas within the urinary bladder. Stomach/Bowel: Negative rectum aside from retained stool. Redundant sigmoid colon with severe diverticulosis. Gas and stool in the transverse colon and right colon. Oral contrast has reached the cecum. Normal appendix. Normal terminal ileum. No dilated small bowel. Negative stomach and duodenum. No definite abdominal free fluid. Vascular/Lymphatic: Vascular patency is not evaluated in the absence of IV contrast. Aortoiliac calcified atherosclerosis. No lymphadenopathy is evident. Reproductive:  Negative. Other: Mild presacral stranding.  No definite pelvic free fluid. Curvilinear area of abnormal soft tissue thickening posterior to the right sacrum is felt to be a skin fold rather than a decubitus wound. A similar finding was present in 2017 along the left side of the sacrum. Musculoskeletal: Chronic abnormal bone mineralization diffusely. Diffuse abnormal sclerosis. No superimposed acute osseous abnormality identified. IMPRESSION: 1. Moderate bilateral hydronephrosis and hydroureter appears secondary to urinary bladder distension / bladder outlet obstruction. Enlarged urinary bladder and multifocal large bladder diverticula. Estimated total bladder volume 1.1 Liters. 2. Severe diverticulosis of the splenic flexure, no definite acute bowel inflammation. 3. Chronic splenomegaly and abnormal bone mineralization, likely the sequelae of the chronic myeloproliferative disease. 4. Chronic small pleural effusions with lung base gas trapping and atelectasis. 5. Cardiomegaly.  Aortic Atherosclerosis (ICD10-I70.0). Electronically Signed   By: Genevie Ann M.D.   On: 12/20/2016 21:58   X-ray Chest Pa And Lateral  Result Date: 12/20/2016 CLINICAL DATA:  Fever today. No chest pain or SOB.  Only right flank pain. Hx of CAD, CHF, HTN, type II diabetes mellitus. EXAM: CHEST  2 VIEW COMPARISON:  Chest x-rays dated 11/14/2016, 11/02/2016 and 05/05/2016. FINDINGS: Stable cardiomegaly. Overall cardiomediastinal silhouette is grossly stable. Central pulmonary vascular congestion and bilateral interstitial prominence, presumed edema, similar to previous exams. No confluent opacity to suggest a developing pneumonia. No pleural effusion or pneumothorax seen. Stable scoliosis.  No acute appearing osseous abnormality. IMPRESSION: 1. Cardiomegaly with central pulmonary vascular congestion and bilateral interstitial prominence, presumed interstitial edema, suggesting CHF/volume overload. This appearance is not significantly changed compared to previous exams suggesting chronic CHF. 2. No evidence of pneumonia seen. Electronically Signed   By: Franki Cabot M.D.   On: 12/20/2016 18:31   Dg Lumbar Spine Complete  Result Date: 12/20/2016 CLINICAL DATA:  R flank pain with chills and dysuria since yesterday; EMS reports tender to palpation  Mild lower back pain - mostly right flank says patient x 2 days. No injury. EXAM: LUMBAR SPINE - COMPLETE 4+ VIEW COMPARISON:  CT abdomen dated 04/14/2016. FINDINGS: Lumbar spine is diffusely sclerotic, similar to the appearance on earlier CT. No fracture line or displaced fracture fragment identified. Mild disc desiccations at L4-5 and L5-S1. Visualized paravertebral soft tissues are unremarkable. IMPRESSION: 1. Osseous structures, lumbar spine and visualized portions of the osseous pelvis, are diffusely sclerotic, similar to the appearance on earlier CT, presumably diffuse metastatic disease (known primary cancer?). 2. No acute findings.  No osseous fracture or dislocation. Electronically Signed   By: Franki Cabot M.D.   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 EXAM: NUCLEAR MEDICINE WHOLE BODY BONE SCAN TECHNIQUE: Whole body anterior and posterior images were obtained approximately 3 hours after intravenous injection of radiopharmaceutical. RADIOPHARMACEUTICALS:  22 mCi Technetium-46mMDP IV COMPARISON:  04/17/2016 Radiographic correlation:  CT abdomen and pelvis 12/20/2016 FINDINGS: Abnormal diffuse increased tracer localization throughout the axial and appendicular skeleton similar to the previous scan. Prominence of uptake in the periarticular regions at the shoulders, elbows, hips, knees, and ankles. No significant increased tracer localization within the calvaria. Minimal tracer accumulation in the kidneys and bladder. Paucity of tracer within the soft tissues. Scintigraphic appearance is that of a super scan unchanged from the previous study. No definite focal areas of increased trace accumulation are discernible. IMPRESSION: Abnormal diffusely increased tracer localization throughout the axial and appendicular skeleton consistent with a super scan. 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 No definite superimposed focal areas of abnormal tracer accumulation are identified but in the setting of diffusely increased uptake, focal superimposed metastatic lesions cannot be excluded. Electronically Signed   By: MLavonia DanaM.D.   On: 12/22/2016 15:49   Ct Bone Marrow Biopsy & Aspiration  Result Date: 12/23/2016 INDICATION: 76year old male with anemia and known bone marrow  disorder EXAM: CT GUIDED BONE MARROW ASPIRATION AND CORE BIOPSY Interventional Radiologist:  HCriselda Peaches MD MEDICATIONS: None. ANESTHESIA/SEDATION: 50 mcg fentanyl administered intravenously. This does not constitute conscious sedation. FLUOROSCOPY TIME:  Fluoroscopy Time: 0 minutes 0 seconds (0 mGy). COMPLICATIONS: None immediate. Estimated blood loss: <25 mL PROCEDURE: Informed written consent was obtained from the patient after a thorough discussion of the procedural risks, benefits and alternatives. All questions were addressed. Maximal Sterile Barrier Technique was utilized including caps, mask, sterile gowns, sterile gloves, sterile drape, hand hygiene and skin antiseptic. A timeout was performed prior to the initiation of the procedure. The patient was positioned prone and non-contrast localization CT was performed of the pelvis to demonstrate the iliac marrow spaces. Maximal barrier sterile technique utilized including caps, mask, sterile gowns, sterile gloves, large sterile drape, hand hygiene, and betadine prep. Under sterile conditions and local anesthesia, an 11 gauge coaxial bone biopsy needle was advanced into the right iliac marrow space. Needle position was confirmed with CT imaging. Initially, bone marrow aspiration was performed. Next, the 11 gauge outer cannula was  utilized to obtain a right iliac bone marrow core biopsy. Needle was removed. Hemostasis was obtained with compression. The patient tolerated the procedure well. Samples were prepared with the cytotechnologist. IMPRESSION: Technically successful right iliac bone marrow biopsy and aspiration. Electronically Signed   By: Jacqulynn Cadet M.D.   On: 12/23/2016 12:15   Dg Hip Unilat W Or Wo Pelvis 2-3 Views Right  Result Date: 12/20/2016 CLINICAL DATA:  R flank pain with chills and dysuria since yesterday; EMS reports tender to palpation Mild lower back pain - mostly right flank says patient x 2 days. No injury. EXAM: DG HIP  (WITH OR WITHOUT PELVIS) 2-3V RIGHT COMPARISON:  CT abdomen dated 04/14/2016. FINDINGS: Osseous structures of the pelvis and proximal femurs are diffusely sclerotic, similar to the appearance on earlier CT. No fracture line or displaced fracture fragment identified. No significant degenerative change at either hip joint or within the lower lumbar spine. Soft tissues about the pelvis and right hip are unremarkable. IMPRESSION: 1. Osseous structures of the pelvis and right hip are diffusely sclerotic, similar to the appearance on earlier CT, presumably osseous metastasis (Known primary cancer?). 2. No acute findings.  No osseous fracture or dislocation seen. Electronically Signed   By: Franki Cabot M.D.   On: 12/20/2016 16:05       Discharge Exam: Vitals:   12/23/16 2223 12/24/16 0513  BP: 128/65 125/71  Pulse: 91 73  Resp: 14 18  Temp: 98.6 F (37 C) 98.4 F (36.9 C)  SpO2: 96% 95%   Vitals:   12/23/16 1253 12/23/16 2223 12/24/16 0440 12/24/16 0513  BP: (!) 115/59 128/65  125/71  Pulse: 95 91  73  Resp: '16 14  18  '$ Temp: 98.1 F (36.7 C) 98.6 F (37 C)  98.4 F (36.9 C)  TempSrc: Oral Oral  Oral  SpO2: 93% 96%  95%  Weight:   87.3 kg (192 lb 7.4 oz)   Height:        General: Pt is alert, awake, not in acute distress Cardiovascular: RRR, S1/S2 +, no rubs, no gallops Respiratory: CTA bilaterally, no wheezing, no rhonchi Abdominal: Soft, NT, ND, bowel sounds + Extremities: no edema, no cyanosis    The results of significant diagnostics from this hospitalization (including imaging, microbiology, ancillary and laboratory) are listed below for reference.     Microbiology: Recent Results (from the past 240 hour(s))  Blood culture (routine x 2)     Status: None (Preliminary result)   Collection Time: 12/20/16  7:03 AM  Result Value Ref Range Status   Specimen Description BLOOD RIGHT ANTECUBITAL  Final   Special Requests   Final    BOTTLES DRAWN AEROBIC AND ANAEROBIC Blood  Culture adequate volume   Culture NO GROWTH 3 DAYS  Final   Report Status PENDING  Incomplete  Culture, Urine     Status: Abnormal   Collection Time: 12/20/16  7:03 AM  Result Value Ref Range Status   Specimen Description URINE, RANDOM  Final   Special Requests ADDED ON 12/21/16 AT 1658  Final   Culture MULTIPLE SPECIES PRESENT, SUGGEST RECOLLECTION (A)  Final   Report Status 12/22/2016 FINAL  Final  Blood culture (routine x 2)     Status: None (Preliminary result)   Collection Time: 12/20/16  7:45 AM  Result Value Ref Range Status   Specimen Description BLOOD RIGHT WRIST  Final   Special Requests   Final    BOTTLES DRAWN AEROBIC AND ANAEROBIC Blood Culture adequate volume  Culture NO GROWTH 3 DAYS  Final   Report Status PENDING  Incomplete     Labs: BNP (last 3 results)  Recent Labs  10/12/16 1030 10/31/16 1605 11/14/16 0504  BNP 989.3* 389.5* 353.6*   Basic Metabolic Panel:  Recent Labs Lab 12/20/16 0613 12/21/16 0723 12/22/16 0711 12/23/16 0334 12/24/16 0434  NA 140 139 139 137 141  K 4.0 4.7 5.3* 5.2* 4.4  CL 110 110 107 105 107  CO2 '24 24 24 22 24  '$ GLUCOSE 101* 95 99 101* 90  BUN 26* 27* 34* 41* 47*  CREATININE 0.92 1.16 1.47* 1.66* 1.47*  CALCIUM 9.5 9.2 9.2 9.1 9.1   Liver Function Tests:  Recent Labs Lab 12/22/16 0711 12/24/16 0434  AST 23 21  ALT 11* 11*  ALKPHOS 118 125  BILITOT 0.6 0.7  PROT 5.7* 5.3*  ALBUMIN 2.7* 2.6*   No results for input(s): LIPASE, AMYLASE in the last 168 hours. No results for input(s): AMMONIA in the last 168 hours. CBC:  Recent Labs Lab 12/20/16 0613 12/21/16 0723 12/22/16 0711 12/23/16 0334 14/43/15 4008  WBC DUPLICATE  9.4 8.4 8.0 9.0 7.2  NEUTROABS 5.0  --  5.4  --  PENDING  HGB DUPLICATE  8.5* 8.0* 8.7* 9.9* 9.0*  HCT DUPLICATE  67.6* 19.5* 09.3* 26.7* 12.4*  MCV DUPLICATE  58.0 99.8 33.8 25.0 53.9  PLT DUPLICATE  767 341 937 902 381   Cardiac Enzymes:  Recent Labs Lab 12/20/16 0613   CKTOTAL 10*   BNP: Invalid input(s): POCBNP CBG: No results for input(s): GLUCAP in the last 168 hours. D-Dimer No results for input(s): DDIMER in the last 72 hours. Hgb A1c No results for input(s): HGBA1C in the last 72 hours. Lipid Profile No results for input(s): CHOL, HDL, LDLCALC, TRIG, CHOLHDL, LDLDIRECT in the last 72 hours. Thyroid function studies No results for input(s): TSH, T4TOTAL, T3FREE, THYROIDAB in the last 72 hours.  Invalid input(s): FREET3 Anemia work up  Recent Labs  12/22/16 0711  VITAMINB12 1,066*  FERRITIN 260  TIBC 192*  IRON 11*  RETICCTPCT 2.7   Urinalysis    Component Value Date/Time   COLORURINE YELLOW 12/20/2016 0703   APPEARANCEUR CLEAR 12/20/2016 0703   LABSPEC 1.020 12/20/2016 0703   PHURINE 6.0 12/20/2016 0703   GLUCOSEU NEGATIVE 12/20/2016 0703   GLUCOSEU NEGATIVE 05/02/2015 1008   HGBUR TRACE (A) 12/20/2016 0703   BILIRUBINUR NEGATIVE 12/20/2016 0703   KETONESUR NEGATIVE 12/20/2016 0703   PROTEINUR >300 (A) 12/20/2016 0703   UROBILINOGEN 0.2 05/02/2015 1008   NITRITE NEGATIVE 12/20/2016 0703   LEUKOCYTESUR MODERATE (A) 12/20/2016 0703   Sepsis Labs Invalid input(s): PROCALCITONIN,  WBC,  LACTICIDVEN Microbiology Recent Results (from the past 240 hour(s))  Blood culture (routine x 2)     Status: None (Preliminary result)   Collection Time: 12/20/16  7:03 AM  Result Value Ref Range Status   Specimen Description BLOOD RIGHT ANTECUBITAL  Final   Special Requests   Final    BOTTLES DRAWN AEROBIC AND ANAEROBIC Blood Culture adequate volume   Culture NO GROWTH 3 DAYS  Final   Report Status PENDING  Incomplete  Culture, Urine     Status: Abnormal   Collection Time: 12/20/16  7:03 AM  Result Value Ref Range Status   Specimen Description URINE, RANDOM  Final   Special Requests ADDED ON 12/21/16 AT 1658  Final   Culture MULTIPLE SPECIES PRESENT, SUGGEST RECOLLECTION (A)  Final   Report Status 12/22/2016 FINAL  Final  Blood  culture (routine x 2)     Status: None (Preliminary result)   Collection Time: 12/20/16  7:45 AM  Result Value Ref Range Status   Specimen Description BLOOD RIGHT WRIST  Final   Special Requests   Final    BOTTLES DRAWN AEROBIC AND ANAEROBIC Blood Culture adequate volume   Culture NO GROWTH 3 DAYS  Final   Report Status PENDING  Incomplete     Time coordinating discharge: Over 30 minutes  SIGNED:   Debbe Odea, MD  [facility] 12/24/2016, 9:40 AM Pager   If 7PM-7AM, please contact night-coverage [email or web address] Password [email or web address]

## 2016-12-24 NOTE — Progress Notes (Signed)
CSW received call from [facility]. After speaking with the patient, patient would rather have home health come to the house since his family is present around the clock and he wouldn't need sitters. RNCM updated.  CSW signing off.  Percell Locus Christyana Corwin LCSWA [phone]

## 2016-12-24 NOTE — Progress Notes (Signed)
   12/24/16 0930  Clinical Encounter Type  Visited With Patient  Visit Type Spiritual support  Spiritual Encounters  Spiritual Needs Prayer;Emotional  Stress Factors  Patient Stress Factors Health changes  Introduction to Pt. Listened to his stories on sports. Offered prayer of comfort and hope.

## 2016-12-25 ENCOUNTER — Telehealth: Payer: Self-pay | Admitting: Behavioral Health

## 2016-12-25 LAB — CULTURE, BLOOD (ROUTINE X 2)
Culture: NO GROWTH
Culture: NO GROWTH
Special Requests: ADEQUATE
Special Requests: ADEQUATE

## 2016-12-25 NOTE — Telephone Encounter (Signed)
Called to follow-up with the patient post hospitalization. Patient declines TCM/Hospital Follow-up call/visit. He voiced that he would come in to see PCP at a later time. Patient has an appointment scheduled for 02/11/17 at 10:45 AM with Dr. Larose Kells.

## 2016-12-25 NOTE — Telephone Encounter (Signed)
noted 

## 2016-12-26 ENCOUNTER — Telehealth: Payer: Self-pay

## 2016-12-26 ENCOUNTER — Emergency Department (HOSPITAL_COMMUNITY)
Admission: EM | Admit: 2016-12-26 | Discharge: 2016-12-26 | Disposition: A | Discharge status: Home / Self Care | Payer: Medicare Other | Attending: Emergency Medicine | Admitting: Emergency Medicine

## 2016-12-26 ENCOUNTER — Emergency Department (HOSPITAL_COMMUNITY): Payer: Medicare Other

## 2016-12-26 DIAGNOSIS — Z1383 Encounter for screening for respiratory disorder NEC: Secondary | ICD-10-CM | POA: Diagnosis present

## 2016-12-26 DIAGNOSIS — R0902 Hypoxemia: Secondary | ICD-10-CM | POA: Diagnosis not present

## 2016-12-26 DIAGNOSIS — I5032 Chronic diastolic (congestive) heart failure: Secondary | ICD-10-CM

## 2016-12-26 DIAGNOSIS — N1330 Unspecified hydronephrosis: Secondary | ICD-10-CM | POA: Diagnosis not present

## 2016-12-26 DIAGNOSIS — Z9981 Dependence on supplemental oxygen: Secondary | ICD-10-CM | POA: Diagnosis not present

## 2016-12-26 DIAGNOSIS — R0602 Shortness of breath: Secondary | ICD-10-CM | POA: Diagnosis not present

## 2016-12-26 DIAGNOSIS — I129 Hypertensive chronic kidney disease with stage 1 through stage 4 chronic kidney disease, or unspecified chronic kidney disease: Secondary | ICD-10-CM | POA: Diagnosis not present

## 2016-12-26 DIAGNOSIS — J80 Acute respiratory distress syndrome: Secondary | ICD-10-CM | POA: Diagnosis not present

## 2016-12-26 DIAGNOSIS — Z7982 Long term (current) use of aspirin: Secondary | ICD-10-CM | POA: Diagnosis not present

## 2016-12-26 DIAGNOSIS — Z79899 Other long term (current) drug therapy: Secondary | ICD-10-CM | POA: Insufficient documentation

## 2016-12-26 DIAGNOSIS — R5381 Other malaise: Secondary | ICD-10-CM | POA: Diagnosis not present

## 2016-12-26 DIAGNOSIS — Z87891 Personal history of nicotine dependence: Secondary | ICD-10-CM | POA: Diagnosis not present

## 2016-12-26 DIAGNOSIS — I2510 Atherosclerotic heart disease of native coronary artery without angina pectoris: Secondary | ICD-10-CM | POA: Diagnosis not present

## 2016-12-26 DIAGNOSIS — N183 Chronic kidney disease, stage 3 (moderate): Secondary | ICD-10-CM | POA: Diagnosis not present

## 2016-12-26 DIAGNOSIS — E1122 Type 2 diabetes mellitus with diabetic chronic kidney disease: Secondary | ICD-10-CM | POA: Insufficient documentation

## 2016-12-26 DIAGNOSIS — I130 Hypertensive heart and chronic kidney disease with heart failure and stage 1 through stage 4 chronic kidney disease, or unspecified chronic kidney disease: Secondary | ICD-10-CM | POA: Diagnosis not present

## 2016-12-26 LAB — COMPREHENSIVE METABOLIC PANEL
ALK PHOS: 145 U/L — AB (ref 38–126)
ALT: 13 U/L — ABNORMAL LOW (ref 17–63)
ANION GAP: 9 (ref 5–15)
AST: 30 U/L (ref 15–41)
Albumin: 2.8 g/dL — ABNORMAL LOW (ref 3.5–5.0)
BUN: 39 mg/dL — ABNORMAL HIGH (ref 6–20)
CALCIUM: 9.6 mg/dL (ref 8.9–10.3)
CO2: 24 mmol/L (ref 22–32)
CREATININE: 0.99 mg/dL (ref 0.61–1.24)
Chloride: 106 mmol/L (ref 101–111)
Glucose, Bld: 103 mg/dL — ABNORMAL HIGH (ref 65–99)
Potassium: 4.1 mmol/L (ref 3.5–5.1)
SODIUM: 139 mmol/L (ref 135–145)
TOTAL PROTEIN: 5.6 g/dL — AB (ref 6.5–8.1)
Total Bilirubin: 0.6 mg/dL (ref 0.3–1.2)

## 2016-12-26 LAB — I-STAT TROPONIN, ED: TROPONIN I, POC: 0.01 ng/mL (ref 0.00–0.08)

## 2016-12-26 LAB — CBC
HCT: 33.3 % — ABNORMAL LOW (ref 39.0–52.0)
HEMOGLOBIN: 10.3 g/dL — AB (ref 13.0–17.0)
MCH: 25.9 pg — AB (ref 26.0–34.0)
MCHC: 30.9 g/dL (ref 30.0–36.0)
MCV: 83.9 fL (ref 78.0–100.0)
PLATELETS: 503 10*3/uL — AB (ref 150–400)
RBC: 3.97 MIL/uL — ABNORMAL LOW (ref 4.22–5.81)
RDW: 19.9 % — ABNORMAL HIGH (ref 11.5–15.5)
WBC: 9.8 10*3/uL (ref 4.0–10.5)

## 2016-12-26 LAB — BRAIN NATRIURETIC PEPTIDE: B Natriuretic Peptide: 409.8 pg/mL — ABNORMAL HIGH (ref 0.0–100.0)

## 2016-12-26 MED ORDER — FUROSEMIDE 20 MG PO TABS
40.0000 mg | ORAL_TABLET | Freq: Once | ORAL | Status: AC
  Administered 2016-12-26: 40 mg via ORAL
  Filled 2016-12-26: qty 2

## 2016-12-26 MED ORDER — FUROSEMIDE 10 MG/ML IJ SOLN: 40.0000 mg | Freq: Once | INTRAMUSCULAR | Status: DC

## 2016-12-26 NOTE — ED Notes (Signed)
ED Provider at bedside. 

## 2016-12-26 NOTE — Discharge Instructions (Signed)
It was our pleasure to provide your ER care today - we hope that you feel better.  Follow up with your doctor/cardiologist this Monday or Tuesday for recheck - call office Monday AM to arrange appointment.   If you want to pursue nursing home care, discuss with primary care doctor, and your home health agency, on Monday - they should help you coordinate those arrangements.   Limit salt intake, continue your medication as prescribed by your doctors.  Return to ER if worse, new symptoms, chest pain, increased trouble breathing, other concern.

## 2016-12-26 NOTE — Telephone Encounter (Signed)
Denyse Amass, RN with [facility] called stating that he was with the pt for assessment for [facility] care... He reports pt is being non-compliant while trying to complete assessment.... Pt refuse to let him see his catheter or medication list and discharge summary. Denyse Amass reports when he obtained pt's vitals that his O2 sat was 83-86% and on 2L of O2 increased to 88%... O2 increased to 93% on 4L of O2.Marland KitchenMarland KitchenMarland KitchenDenyse Amass wanted to know if it was okay to leave pt on the 4L of O2.... Per Dr Diona Browner (on call) verbal order okay to leave pt on 4L... Per Denyse Amass pt is aware if increase SOB, chest pain or difficulty breathing to seek emergency care

## 2016-12-26 NOTE — ED Notes (Signed)
[facility] contacted to transport patient home. 

## 2016-12-26 NOTE — ED Notes (Signed)
Warm blanket given

## 2016-12-26 NOTE — ED Triage Notes (Signed)
Pt from home via [facility] with decreased O2 sats, wears 2L [place] baseline. EMS states sats were 82% on 6L when they arrived. With [place], sats came up to 94% on 2L, wearing now. Hx of PNA 1 mo ago, has been having to increase over the past 2 weeks. Able to speak in full sentences, alert

## 2016-12-26 NOTE — ED Notes (Signed)
Portable xray at bedside.

## 2016-12-26 NOTE — ED Notes (Addendum)
Pt refusing IV. States he wants PO lasix. EDP made aware.

## 2016-12-26 NOTE — ED Provider Notes (Signed)
[facility] DEPT Provider Note   CSN: [id number] Arrival date & time: 12/26/16  1517     History   Chief Complaint Chief Complaint  Patient presents with  . Hypoxia    HPI Juan [name] is a 76 y.o. male.  Patient with hx chf, was told by home health person today that his oxygen level may need 4 liters instead of 2,and said he needed to go to hospital to get that checked. Is on 2 liters continuous at home.  Denies chest pain or sob. Pt does note mild sob in past 2-3 days.  No chest pain or discomfort. Occasional non prod cough. No sore throat. No fever or chills. No increased leg edema. Taking normal meds, but states may have missed doses recently. States urine/foley catheter draining normally.    The history is provided by the patient and the EMS personnel.    Past Medical History:  Diagnosis Date  . Allergic rhinitis   . Anemia   . Ascending aortic aneurysm ([place]) 03/2014   4.3cm on CT scan  . CAD (coronary artery disease)    dx elsewheer in past, no documentation. Non-ischemic myoview 2007  . Chronic diastolic CHF (congestive heart failure), NYHA class 2 (HCC)    Normal EF w/ grade 1 dd by echo 12/2015  . Edema    R>L leg, u/s 5-12 neg for DVT  . Hemorrhoid   . History of thrombocytosis   . Hypertension   . Migraine    "once/wk at least" (07/11/2013)  . Myeloproliferative disease ([place])   . Shortness of breath   . Sinus congestion   . Sleep apnea, obstructive    at some point used CPAP, was d/c  years ago  . Type II diabetes mellitus [facility])     Patient Active Problem List   Diagnosis Date Noted  . Right hip pain 12/24/2016  . 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 Muscle spasm 11/16/2016  . Strep throat 11/16/2016  . Neck pain 11/14/2016  . Elevated troponin 11/14/2016  . Arthritis   . Recurrent pneumonia 10/31/2016  . Acute extremity pain 10/31/2016  . HCAP (healthcare-associated pneumonia) 10/31/2016  . 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  Pulmonary hypertension ([place])   . Sprain of left rotator cuff capsule   . Hyperkalemia   . Acute kidney injury superimposed on CKD ([place])   . Fever 10/08/2016  . Left arm pain 10/08/2016  . Paroxysmal SVT (supraventricular tachycardia) ([place]) 04/20/2016  . Type II diabetes mellitus ([place]) 04/20/2016  . Gout 04/20/2016  . Hyperuricemia 04/20/2016  . B12 deficiency 04/20/2016  . 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 Hydronephrosis   . Thrombocytosis ([place]) 04/07/2016  . Anemia 04/07/2016  . Community acquired pneumonia 04/01/2016  . Pneumonia 04/01/2016  . Pressure injury of skin 04/01/2016  . Pressure ulcer stage II 12/31/2015  . UTI (urinary tract infection) 12/30/2015  . Sepsis ([place]) 12/29/2015  . Acute lower UTI 12/29/2015  . T wave inversion in EKG 12/29/2015  . PCP NOTES >>>>>>>>>>>>>>>>> 05/02/2015  . Arm pain   . Peripheral edema   . Unable to ambulate   . Thoracic aortic aneurysm ([place]) 04/26/2014  . CKD (chronic kidney disease) stage 3, GFR 30-59 ml/min 04/26/2014  . 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 Chest pain 04/15/2014  . Encounter for palliative care 04/06/2014  . Generalized weakness 04/06/2014  . Venous stasis dermatitis of both lower extremities   . Malnutrition of moderate degree ([place])  03/31/2014  . Morbid obesity due to excess calories ([place]) 03/29/2014  . 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 Poor compliance with advise  05/05/2012  . Annual physical exam 01/14/2012  . Weight loss 01/14/2012  . *Chronic lower extremity edema, stasis dermatitis 06/09/2011  . CARDIOMEGALY 04/29/2009  . *Prediabetes 09/07/2006  . Symptomatic anemia 09/07/2006  . Myeloproliferative disease ([place]) 09/07/2006  . Obstructive sleep apnea 09/07/2006  . Essential hypertension 09/07/2006  . Coronary atherosclerosis 09/07/2006  . Chronic diastolic CHF (congestive heart failure) ([place]) 09/07/2006  . Allergic rhinitis 09/07/2006     Past Surgical History:  Procedure Laterality Date  . TOE SURGERY Right    "tried to straighten out big toe" (07/11/2013)       Home Medications    Prior to Admission medications   Medication Sig Start Date End Date Taking? Authorizing Provider  acetaminophen (TYLENOL) 325 MG tablet Take 2 tablets (650 mg total) by mouth every 6 (six) hours as needed for mild pain, fever or headache. 11/16/16   Janece Canterbury, MD  aspirin 325 MG tablet Take 325 mg by mouth daily.    [provider]  atorvastatin (LIPITOR) 40 MG tablet Take 1 tablet (40 mg total) by mouth daily. 11/16/16 11/16/17  Janece Canterbury, MD  B Complex Vitamins (VITAMIN B COMPLEX PO) Take 100 mg by mouth daily.    [provider]  bisacodyl (DULCOLAX) 10 MG suppository Place 1 suppository (10 mg total) rectally daily as needed for moderate constipation. 04/18/16   Modena Jansky, MD  carvedilol (COREG) 3.125 MG tablet Take 1 tablet (3.125 mg total) by mouth 2 (two) times daily with a meal. 10/02/16   Tillery, Satira Mccallum, PA-C  cefUROXime (CEFTIN) 250 MG tablet Take 1 tablet (250 mg total) by mouth 2 (two) times daily with a meal. 12/24/16   Debbe Odea, MD  colchicine 0.6 MG tablet Take 1 tablet (0.6 mg total) by mouth daily. 12/24/16   Debbe Odea, MD  diclofenac sodium (VOLTAREN) 1 % GEL Apply 4 g topically 2 (two) times daily as needed (pain). Apply to bilateral  knees     [provider]  feeding supplement, GLUCERNA SHAKE, (GLUCERNA SHAKE) LIQD Take 118.5 mLs by mouth daily. Reported on 08/21/2015    [provider]  ferrous gluconate (FERGON) 324 MG tablet Take 1 tablet (324 mg total) by mouth 3 (three) times daily with meals. 11/16/16   Janece Canterbury, MD  fluticasone (FLONASE) 50 MCG/ACT nasal spray Place 2 sprays into both nostrils daily. Reported on 05/02/2015    [provider]  gabapentin (NEURONTIN) 100 MG capsule Take 2 capsules (200 mg total) by mouth 3 (three) times  daily as needed (pain). Reported on 08/22/2015 10/17/16   Rama, Venetia Maxon, MD  hydrALAZINE (APRESOLINE) 25 MG tablet Take 50 mg by mouth 3 (three) times daily.     [provider]  hydrocerin (EUCERIN) CREA Apply Eucerin cream to BLE Q day after bathing and roughly towel drying to remove loose skin Patient taking differently: Apply 1 application topically daily as needed (dryness).  01/27/14   Ghimire, Henreitta Leber, MD  HYDROcodone-acetaminophen (NORCO/VICODIN) 5-325 MG tablet Take 1-2 tablets by mouth every 4 (four) hours as needed for moderate pain. 12/24/16   Debbe Odea, MD  isosorbide mononitrate (IMDUR) 30 MG 24 hr tablet Take 30 mg by mouth 2 (two) times daily.    [provider]  methocarbamol (ROBAXIN) 750 MG tablet Take 1 tablet (750 mg total) by mouth  3 (three) times daily. 10/17/16   Rama, Venetia Maxon, MD  OXYGEN Inhale 2 L into the lungs continuous.    [provider]  polyethylene glycol (MIRALAX / GLYCOLAX) packet Take 17 g by mouth daily. 12/24/16   Debbe Odea, MD  simethicone (MYLICON) 80 MG chewable tablet Chew 80 mg by mouth every 6 (six) hours as needed for flatulence.    [provider]  tamsulosin (FLOMAX) 0.4 MG CAPS capsule Take 1 capsule (0.4 mg total) by mouth daily. 12/24/16   Debbe Odea, MD    Family History Family History  Problem Relation Age of Onset  . Schizophrenia Son   . Colon cancer Neg Hx   . Prostate cancer Neg Hx   . Heart attack Neg Hx   . Diabetes Neg Hx     Social History Social History  Substance Use Topics  . Smoking status: Former Smoker    Packs/day: 0.25    Years: 12.00    Types: Cigarettes    Quit date: 05/18/1966  . Smokeless tobacco: Never Used     Comment: quit smoking 45 years ago  . Alcohol use No     Allergies   Patient has no known allergies.   Review of Systems Review of Systems  Constitutional: Negative for chills and fever.  HENT: Negative for sore throat.   Eyes: Negative for redness.   Respiratory: Negative for shortness of breath.   Cardiovascular: Negative for chest pain.  Gastrointestinal: Negative for abdominal pain.  Endocrine: Negative for polyuria.  Genitourinary: Negative for dysuria and flank pain.  Musculoskeletal: Negative for back pain and neck pain.  Skin: Negative for rash.  Neurological: Negative for headaches.  Hematological: Does not bruise/bleed easily.  Psychiatric/Behavioral: Negative for confusion.     Physical Exam Updated Vital Signs BP (!) 166/83 (BP Location: Right Arm)   Pulse 78   Temp 98.4 F (36.9 C) (Oral)   Resp 19   SpO2 97%   Physical Exam  Constitutional: He is oriented to person, place, and time. He appears well-developed and well-nourished. No distress.  HENT:  Mouth/Throat: Oropharynx is clear and moist.  Eyes: Conjunctivae are normal.  Neck: Neck supple. No JVD present. No tracheal deviation present.  Cardiovascular: Normal rate, regular rhythm, normal heart sounds and intact distal pulses.  Exam reveals no gallop and no friction rub.   No murmur heard. Pulmonary/Chest: Effort normal and breath sounds normal. No accessory muscle usage. No respiratory distress.  Abdominal: Soft. Bowel sounds are normal. He exhibits no distension. There is no tenderness.  Genitourinary:  Genitourinary Comments: No cva tenderness, foley draining clear yellow urine.   Musculoskeletal:  Mild bil ankle edema,.   Neurological: He is alert and oriented to person, place, and time.  Skin: Skin is warm and dry. No rash noted. He is not diaphoretic.  Psychiatric: He has a normal mood and affect.  Nursing note and vitals reviewed.    ED Treatments / Results  Labs (all labs ordered are listed, but only abnormal results are displayed) Results for orders placed or performed during the hospital encounter of 12/26/16  Brain natriuretic peptide  Result Value Ref Range   B Natriuretic Peptide 409.8 (H) 0.0 - 100.0 pg/mL  CBC  Result Value Ref  Range   WBC 9.8 4.0 - 10.5 K/uL   RBC 3.97 (L) 4.22 - 5.81 MIL/uL   Hemoglobin 10.3 (L) 13.0 - 17.0 g/dL   HCT 33.3 (L) 39.0 - 52.0 %   MCV 83.9  78.0 - 100.0 fL   MCH 25.9 (L) 26.0 - 34.0 pg   MCHC 30.9 30.0 - 36.0 g/dL   RDW 19.9 (H) 11.5 - 15.5 %   Platelets 503 (H) 150 - 400 K/uL  Comprehensive metabolic panel  Result Value Ref Range   Sodium 139 135 - 145 mmol/L   Potassium 4.1 3.5 - 5.1 mmol/L   Chloride 106 101 - 111 mmol/L   CO2 24 22 - 32 mmol/L   Glucose, Bld 103 (H) 65 - 99 mg/dL   BUN 39 (H) 6 - 20 mg/dL   Creatinine, Ser 0.99 0.61 - 1.24 mg/dL   Calcium 9.6 8.9 - 10.3 mg/dL   Total Protein 5.6 (L) 6.5 - 8.1 g/dL   Albumin 2.8 (L) 3.5 - 5.0 g/dL   AST 30 15 - 41 U/L   ALT 13 (L) 17 - 63 U/L   Alkaline Phosphatase 145 (H) 38 - 126 U/L   Total Bilirubin 0.6 0.3 - 1.2 mg/dL   GFR calc non Af Amer >60 >60 mL/min   GFR calc Af Amer >60 >60 mL/min   Anion gap 9 5 - 15  I-stat troponin, ED  Result Value Ref Range   Troponin i, poc 0.01 0.00 - 0.08 ng/mL   Comment 3           Ct Abdomen Pelvis Wo Contrast  Result Date: 12/20/2016 CLINICAL DATA:  76 year old male with flank pain, nausea vomiting. Personal history of myeloproliferative disease. EXAM: CT ABDOMEN AND PELVIS WITHOUT CONTRAST TECHNIQUE: Multidetector CT imaging of the abdomen and pelvis was performed following the standard protocol without IV contrast. COMPARISON:  CT Abdomen and Pelvis 04/14/2016 FINDINGS: Lower chest: Chronic mosaic attenuation at the lung bases, favor due to gas trapping. Superimposed small bilateral layering pleural effusions, chronic elevation of the right hemidiaphragm, and chronic posterior basal segment right lower lobe atelectasis. Cardiomegaly. No pericardial effusion. Hepatobiliary: Chronic elevation the right hemidiaphragm. Negative noncontrast liver and gallbladder. Pancreas: Negative noncontrast pancreas. Spleen: Chronic splenomegaly is stable. Adrenals/Urinary Tract: Normal adrenal  glands. Increased bilateral renal collecting system size compatible with mild to moderate hydronephrosis. Associated bilateral hydroureter with tortuous bilateral ureters. No urologic calculus identified, but the urinary bladder is both abnormally distended and multi loculated appearing, suggesting multiple large bladder diverticula. Overall urinary bladder volume is estimated at 1,100 mL. No gas within the urinary bladder. Stomach/Bowel: Negative rectum aside from retained stool. Redundant sigmoid colon with severe diverticulosis. Gas and stool in the transverse colon and right colon. Oral contrast has reached the cecum. Normal appendix. Normal terminal ileum. No dilated small bowel. Negative stomach and duodenum. No definite abdominal free fluid. Vascular/Lymphatic: Vascular patency is not evaluated in the absence of IV contrast. Aortoiliac calcified atherosclerosis. No lymphadenopathy is evident. Reproductive:  Negative. Other: Mild presacral stranding.  No definite pelvic free fluid. Curvilinear area of abnormal soft tissue thickening posterior to the right sacrum is felt to be a skin fold rather than a decubitus wound. A similar finding was present in 2017 along the left side of the sacrum. Musculoskeletal: Chronic abnormal bone mineralization diffusely. Diffuse abnormal sclerosis. No superimposed acute osseous abnormality identified. IMPRESSION: 1. Moderate bilateral hydronephrosis and hydroureter appears secondary to urinary bladder distension / bladder outlet obstruction. Enlarged urinary bladder and multifocal large bladder diverticula. Estimated total bladder volume 1.1 Liters. 2. Severe diverticulosis of the splenic flexure, no definite acute bowel inflammation. 3. Chronic splenomegaly and abnormal bone mineralization, likely the sequelae of the chronic myeloproliferative disease. 4. Chronic  small pleural effusions with lung base gas trapping and atelectasis. 5. Cardiomegaly.  Aortic Atherosclerosis  (ICD10-I70.0). Electronically Signed   By: Genevie Ann M.D.   On: 12/20/2016 21:58   X-ray Chest Pa And Lateral  Result Date: 12/20/2016 CLINICAL DATA:  Fever today. No chest pain or SOB. Only right flank pain. Hx of CAD, CHF, HTN, type II diabetes mellitus. EXAM: CHEST  2 VIEW COMPARISON:  Chest x-rays dated 11/14/2016, 11/02/2016 and 05/05/2016. FINDINGS: Stable cardiomegaly. Overall cardiomediastinal silhouette is grossly stable. Central pulmonary vascular congestion and bilateral interstitial prominence, presumed edema, similar to previous exams. No confluent opacity to suggest a developing pneumonia. No pleural effusion or pneumothorax seen. Stable scoliosis.  No acute appearing osseous abnormality. IMPRESSION: 1. Cardiomegaly with central pulmonary vascular congestion and bilateral interstitial prominence, presumed interstitial edema, suggesting CHF/volume overload. This appearance is not significantly changed compared to previous exams suggesting chronic CHF. 2. No evidence of pneumonia seen. Electronically Signed   By: Franki Cabot M.D.   On: 12/20/2016 18:31   Dg Lumbar Spine Complete  Result Date: 12/20/2016 CLINICAL DATA:  R flank pain with chills and dysuria since yesterday; EMS reports tender to palpation Mild lower back pain - mostly right flank says patient x 2 days. No injury. EXAM: LUMBAR SPINE - COMPLETE 4+ VIEW COMPARISON:  CT abdomen dated 04/14/2016. FINDINGS: Lumbar spine is diffusely sclerotic, similar to the appearance on earlier CT. No fracture line or displaced fracture fragment identified. Mild disc desiccations at L4-5 and L5-S1. Visualized paravertebral soft tissues are unremarkable. IMPRESSION: 1. Osseous structures, lumbar spine and visualized portions of the osseous pelvis, are diffusely sclerotic, similar to the appearance on earlier CT, presumably diffuse metastatic disease (known primary cancer?). 2. No acute findings.  No osseous fracture or dislocation. Electronically Signed    By: Franki Cabot M.D.   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 EXAM: NUCLEAR MEDICINE WHOLE BODY BONE SCAN TECHNIQUE: Whole body anterior and posterior images were obtained approximately 3 hours after intravenous injection of radiopharmaceutical. RADIOPHARMACEUTICALS:  22 mCi Technetium-59mMDP IV COMPARISON:  04/17/2016 Radiographic correlation:  CT abdomen and pelvis 12/20/2016 FINDINGS: Abnormal diffuse increased tracer localization throughout the axial and appendicular skeleton similar to the previous scan. Prominence of uptake in the periarticular regions at the shoulders, elbows, hips, knees, and ankles. No significant increased tracer localization within the calvaria. Minimal tracer accumulation in the kidneys and bladder. Paucity of tracer within the soft tissues. Scintigraphic appearance is that of a super scan unchanged from the previous study. No definite focal areas of increased trace accumulation are discernible. IMPRESSION: Abnormal diffusely increased tracer localization throughout the axial and appendicular skeleton consistent with a super scan. 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 No definite superimposed focal areas of abnormal tracer accumulation are identified but in the setting of diffusely increased uptake, focal superimposed metastatic lesions cannot be excluded. Electronically  Signed   By: MLavonia DanaM.D.   On: 12/22/2016 15:49   [facility]  Result Date: 12/26/2016 CLINICAL DATA:  Initial evaluation for acute shortness of breath, recent history of pneumonia. EXAM: PORTABLE CHEST 1 VIEW COMPARISON:  Prior radiograph from 12/20/2016. FINDINGS: Moderate cardiomegaly, stable from previous. Mediastinal silhouette within normal limits. Lungs hypoinflated. Perihilar vascular congestion with diffuse interstitial prominence, suggesting CHF. No definite focal infiltrates identified. No appreciable pleural effusion. No pneumothorax. No acute osseus abnormality. Diffuse sclerosis seen throughout the visualized osseous structures, consistent with known history of myelofibrosis. No acute osseus abnormality. IMPRESSION: 1. Cardiomegaly with perihilar vascular congestion and interstitial prominence, most consistent with acute CHF exacerbation. No definite evidence for superimposed pneumonia. 2. Diffuse sclerosis throughout the visualized osseous structures, most likely related to history of myelofibrosis as seen on recent bone scan. Electronically Signed   By: Jeannine Boga M.D.   On: 12/26/2016 16:08   Ct Bone Marrow Biopsy & Aspiration  Result Date: 12/23/2016 INDICATION: 76 year old male with anemia and known bone marrow disorder EXAM: CT GUIDED BONE MARROW ASPIRATION AND CORE BIOPSY Interventional Radiologist:  Criselda Peaches, MD MEDICATIONS: None. ANESTHESIA/SEDATION: 50 mcg fentanyl administered intravenously. This does not constitute conscious sedation. FLUOROSCOPY TIME:  Fluoroscopy Time: 0 minutes 0 seconds (0 mGy). COMPLICATIONS: None immediate. Estimated blood loss: <25 mL PROCEDURE: Informed written consent was obtained from the patient after a thorough discussion of the procedural risks, benefits and alternatives. All questions were addressed. Maximal Sterile Barrier Technique was utilized including caps, mask, sterile gowns, sterile gloves, sterile drape, hand  hygiene and skin antiseptic. A timeout was performed prior to the initiation of the procedure. The patient was positioned prone and non-contrast localization CT was performed of the pelvis to demonstrate the iliac marrow spaces. Maximal barrier sterile technique utilized including caps, mask, sterile gowns, sterile gloves, large sterile drape, hand hygiene, and betadine prep. Under sterile conditions and local anesthesia, an 11 gauge coaxial bone biopsy needle was advanced into the right iliac marrow space. Needle position was confirmed with CT imaging. Initially, bone marrow aspiration was performed. Next, the 11 gauge outer cannula was utilized to obtain a right iliac bone marrow core biopsy. Needle was removed. Hemostasis was obtained with compression. The patient tolerated the procedure well. Samples were prepared with the cytotechnologist. IMPRESSION: Technically successful right iliac bone marrow biopsy and aspiration. Electronically Signed   By: Jacqulynn Cadet M.D.   On: 12/23/2016 12:15   Dg Hip Unilat W Or Wo Pelvis 2-3 Views Right  Result Date: 12/20/2016 CLINICAL DATA:  R flank pain with chills and dysuria since yesterday; EMS reports tender to palpation Mild lower back pain - mostly right flank says patient x 2 days. No injury. EXAM: DG HIP (WITH OR WITHOUT PELVIS) 2-3V RIGHT COMPARISON:  CT abdomen dated 04/14/2016. FINDINGS: Osseous structures of the pelvis and proximal femurs are diffusely sclerotic, similar to the appearance on earlier CT. No fracture line or displaced fracture fragment identified. No significant degenerative change at either hip joint or within the lower lumbar spine. Soft tissues about the pelvis and right hip are unremarkable. IMPRESSION: 1. Osseous structures of the pelvis and right hip are diffusely sclerotic, similar to the appearance on earlier CT, presumably osseous metastasis (Known primary cancer?). 2. No acute findings.  No osseous fracture or dislocation seen.  Electronically Signed   By: Franki Cabot M.D.   On: 12/20/2016 16:05    EKG  EKG Interpretation None       Radiology Dg Chest Port 1 View  Result Date: 12/26/2016 CLINICAL DATA:  Initial evaluation  for acute shortness of breath, recent history of pneumonia. EXAM: PORTABLE CHEST 1 VIEW COMPARISON:  Prior radiograph from 12/20/2016. FINDINGS: Moderate cardiomegaly, stable from previous. Mediastinal silhouette within normal limits. Lungs hypoinflated. Perihilar vascular congestion with diffuse interstitial prominence, suggesting CHF. No definite focal infiltrates identified. No appreciable pleural effusion. No pneumothorax. No acute osseus abnormality. Diffuse sclerosis seen throughout the visualized osseous structures, consistent with known history of myelofibrosis. No acute osseus abnormality. IMPRESSION: 1. Cardiomegaly with perihilar vascular congestion and interstitial prominence, most consistent with acute CHF exacerbation. No definite evidence for superimposed pneumonia. 2. Diffuse sclerosis throughout the visualized osseous structures, most likely related to history of myelofibrosis as seen on recent bone scan. Electronically Signed   By: Jeannine Boga M.D.   On: 12/26/2016 16:08    Procedures Procedures (including critical care time)  Medications Ordered in ED Medications - No data to display   Initial Impression / Assessment and Plan / ED Course  I have reviewed the triage vital signs and the nursing notes.  Pertinent labs & imaging results that were available during my care of the patient were reviewed by me and considered in my medical decision making (see chart for details).  Iv ns. Labs. Cxr.   Reviewed nursing notes and prior charts for additional history.   V mild vascular congestion on cxr.  Lasix dose given.  Pt with no increased wob.  On his normal home 02, 2 liters his sats consistently 98%.    Pt currently appears stable for d/c.     Final Clinical  Impressions(s) / ED Diagnoses   Final diagnoses:  None    New Prescriptions New Prescriptions   No medications on file     Lajean Saver, MD 12/26/16 1736

## 2016-12-29 ENCOUNTER — Emergency Department (HOSPITAL_COMMUNITY)
Admission: EM | Admit: 2016-12-29 | Discharge: 2016-12-29 | Disposition: A | Discharge status: Home / Self Care | Payer: Medicare Other | Attending: Emergency Medicine | Admitting: Emergency Medicine

## 2016-12-29 ENCOUNTER — Encounter (HOSPITAL_COMMUNITY): Payer: Self-pay

## 2016-12-29 DIAGNOSIS — Z960 Presence of urogenital implants: Secondary | ICD-10-CM

## 2016-12-29 DIAGNOSIS — S4990XA Unspecified injury of shoulder and upper arm, unspecified arm, initial encounter: Secondary | ICD-10-CM | POA: Diagnosis not present

## 2016-12-29 DIAGNOSIS — Z7982 Long term (current) use of aspirin: Secondary | ICD-10-CM | POA: Insufficient documentation

## 2016-12-29 DIAGNOSIS — E1122 Type 2 diabetes mellitus with diabetic chronic kidney disease: Secondary | ICD-10-CM | POA: Insufficient documentation

## 2016-12-29 DIAGNOSIS — I2510 Atherosclerotic heart disease of native coronary artery without angina pectoris: Secondary | ICD-10-CM | POA: Insufficient documentation

## 2016-12-29 DIAGNOSIS — I5032 Chronic diastolic (congestive) heart failure: Secondary | ICD-10-CM | POA: Insufficient documentation

## 2016-12-29 DIAGNOSIS — Z978 Presence of other specified devices: Secondary | ICD-10-CM

## 2016-12-29 DIAGNOSIS — T83098A Other mechanical complication of other indwelling urethral catheter, initial encounter: Secondary | ICD-10-CM | POA: Insufficient documentation

## 2016-12-29 DIAGNOSIS — I5040 Unspecified combined systolic (congestive) and diastolic (congestive) heart failure: Secondary | ICD-10-CM | POA: Diagnosis not present

## 2016-12-29 DIAGNOSIS — Z79899 Other long term (current) drug therapy: Secondary | ICD-10-CM | POA: Diagnosis not present

## 2016-12-29 DIAGNOSIS — D473 Essential (hemorrhagic) thrombocythemia: Secondary | ICD-10-CM | POA: Diagnosis not present

## 2016-12-29 DIAGNOSIS — Y69 Unspecified misadventure during surgical and medical care: Secondary | ICD-10-CM | POA: Diagnosis not present

## 2016-12-29 DIAGNOSIS — T83091A Other mechanical complication of indwelling urethral catheter, initial encounter: Secondary | ICD-10-CM | POA: Diagnosis not present

## 2016-12-29 DIAGNOSIS — Z9981 Dependence on supplemental oxygen: Secondary | ICD-10-CM | POA: Diagnosis not present

## 2016-12-29 DIAGNOSIS — I130 Hypertensive heart and chronic kidney disease with heart failure and stage 1 through stage 4 chronic kidney disease, or unspecified chronic kidney disease: Secondary | ICD-10-CM | POA: Diagnosis not present

## 2016-12-29 DIAGNOSIS — R339 Retention of urine, unspecified: Secondary | ICD-10-CM | POA: Insufficient documentation

## 2016-12-29 DIAGNOSIS — Z87891 Personal history of nicotine dependence: Secondary | ICD-10-CM | POA: Insufficient documentation

## 2016-12-29 DIAGNOSIS — N183 Chronic kidney disease, stage 3 (moderate): Secondary | ICD-10-CM | POA: Diagnosis not present

## 2016-12-29 DIAGNOSIS — D649 Anemia, unspecified: Secondary | ICD-10-CM | POA: Diagnosis not present

## 2016-12-29 DIAGNOSIS — R2689 Other abnormalities of gait and mobility: Secondary | ICD-10-CM | POA: Diagnosis not present

## 2016-12-29 DIAGNOSIS — R718 Other abnormality of red blood cells: Secondary | ICD-10-CM | POA: Diagnosis not present

## 2016-12-29 DIAGNOSIS — S6990XA Unspecified injury of unspecified wrist, hand and finger(s), initial encounter: Secondary | ICD-10-CM | POA: Diagnosis not present

## 2016-12-29 LAB — BASIC METABOLIC PANEL
Anion gap: 8 (ref 5–15)
BUN: 46 mg/dL — AB (ref 6–20)
CALCIUM: 9.7 mg/dL (ref 8.9–10.3)
CO2: 25 mmol/L (ref 22–32)
CREATININE: 1.08 mg/dL (ref 0.61–1.24)
Chloride: 104 mmol/L (ref 101–111)
GFR calc Af Amer: >60 mL/min (ref >60)
Glucose, Bld: 102 mg/dL — ABNORMAL HIGH (ref 65–99)
Potassium: 4.4 mmol/L (ref 3.5–5.1)
SODIUM: 137 mmol/L (ref 135–145)

## 2016-12-29 LAB — URINALYSIS, ROUTINE W REFLEX MICROSCOPIC
Bacteria, UA: NONE SEEN
Bilirubin Urine: NEGATIVE
GLUCOSE, UA: NEGATIVE mg/dL
Ketones, ur: NEGATIVE mg/dL
Leukocytes, UA: NEGATIVE
Nitrite: NEGATIVE
PH: 5 (ref 5.0–8.0)
Protein, ur: >=300 mg/dL — AB
Specific Gravity, Urine: 1.012 (ref 1.005–1.030)
Squamous Epithelial / LPF: NONE SEEN

## 2016-12-29 LAB — CBC WITH DIFFERENTIAL/PLATELET
Band Neutrophils: 0 %
Basophils Absolute: 0.3 10*3/uL — ABNORMAL HIGH (ref 0.0–0.1)
Basophils Relative: 3 %
Blasts: 0 %
EOS ABS: 0.1 10*3/uL (ref 0.0–0.7)
Eosinophils Relative: 1 %
HCT: 34.1 % — ABNORMAL LOW (ref 39.0–52.0)
Hemoglobin: 10.5 g/dL — ABNORMAL LOW (ref 13.0–17.0)
LYMPHS PCT: 30 %
Lymphs Abs: 3.3 10*3/uL (ref 0.7–4.0)
MCH: 26.1 pg (ref 26.0–34.0)
MCHC: 30.8 g/dL (ref 30.0–36.0)
MCV: 84.8 fL (ref 78.0–100.0)
MONO ABS: 0.6 10*3/uL (ref 0.1–1.0)
Metamyelocytes Relative: 0 %
Monocytes Relative: 5 %
Myelocytes: 0 %
NEUTROS ABS: 6.7 10*3/uL (ref 1.7–7.7)
Neutrophils Relative %: 61 %
OTHER: 0 %
PLATELETS: 407 10*3/uL — AB (ref 150–400)
PROMYELOCYTES ABS: 0 %
RBC: 4.02 MIL/uL — ABNORMAL LOW (ref 4.22–5.81)
RDW: 19.9 % — AB (ref 11.5–15.5)
WBC: 11 10*3/uL — ABNORMAL HIGH (ref 4.0–10.5)
nRBC: 0 /100 WBC

## 2016-12-29 NOTE — ED Notes (Signed)
[facility] called for the 3x for transport home, [facility] said they did not have a patient on the list by this name.

## 2016-12-29 NOTE — ED Provider Notes (Signed)
[facility] DEPT Provider Note   CSN: [id number] Arrival date & time: 12/29/16  3557     History   Chief Complaint Chief Complaint  Patient presents with  . Urinary Catheter Removal    HPI Juan [name] is a 76 y.o. male.  HPI   76 year old male with an extensive past medical history listed below including recent admission for urinary retention in the setting of urinary tract infection requiring Foley placement. Patient was treated with antibiotics and sent home on Ceftin course which should been completed August 11. Patient was instructed to follow-up with Dr. Jeffie Pollock, urology, for urinary retention.  Patient presents today for evaluation of his Foley catheter. He reported that the last time he was here he was told to go to Dr. Ralene Muskrat office or present to the hospital to have the Foley catheter removed. He complains of mild external urethral meatus irritation due to the catheter. Otherwise denies any fevers, chills, abdominal pain. Irritation is exacerbated with movement of the catheter. No other alleviating or aggravating factors. He denies any additional physical complaints at this time.  Patient is unsure whether he was provided a prescription for the Ceftin after discharge and is not certain how he takes his medication. He reports that his son helps him with his medications but the son is currently not present and there is no way to contact him.  Past Medical History:  Diagnosis Date  . Allergic rhinitis   . Anemia   . Ascending aortic aneurysm ([place]) 03/2014   4.3cm on CT scan  . CAD (coronary artery disease)    dx elsewheer in past, no documentation. Non-ischemic myoview 2007  . Chronic diastolic CHF (congestive heart failure), NYHA class 2 (HCC)    Normal EF w/ grade 1 dd by echo 12/2015  . Edema    R>L leg, u/s 5-12 neg for DVT  . Hemorrhoid   . History of thrombocytosis   . Hypertension   . Migraine    "once/wk at least" (07/11/2013)  . Myeloproliferative disease  ([place])   . Shortness of breath   . Sinus congestion   . Sleep apnea, obstructive    at some point used CPAP, was d/c  years ago  . Type II diabetes mellitus [facility])     Patient Active Problem List   Diagnosis Date Noted  . Right hip pain 12/24/2016  . 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 Muscle spasm 11/16/2016  . Strep throat 11/16/2016  . Neck pain 11/14/2016  . Elevated troponin 11/14/2016  . Arthritis   . Recurrent pneumonia 10/31/2016  . Acute extremity pain 10/31/2016  . HCAP (healthcare-associated pneumonia) 10/31/2016  . 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 Pulmonary hypertension ([place])   . Sprain of left rotator cuff capsule   . Hyperkalemia   . Acute kidney injury superimposed on CKD ([place])   . Fever 10/08/2016  . Left arm pain 10/08/2016  . Paroxysmal SVT (supraventricular tachycardia) ([place]) 04/20/2016  . Type II diabetes mellitus ([place]) 04/20/2016  . Gout 04/20/2016  . Hyperuricemia 04/20/2016  . B12 deficiency 04/20/2016  . 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 Hydronephrosis   . Thrombocytosis ([place]) 04/07/2016  . Anemia 04/07/2016  . Community acquired pneumonia 04/01/2016  . Pneumonia 04/01/2016  . Pressure injury of skin 04/01/2016  . Pressure ulcer stage II 12/31/2015  . UTI (urinary tract infection) 12/30/2015  . Sepsis ([place]) 12/29/2015  . Acute lower UTI 12/29/2015  . T wave inversion in EKG 12/29/2015  .  PCP NOTES >>>>>>>>>>>>>>>>> 05/02/2015  . Arm pain   . Peripheral edema   . Unable to ambulate   . Thoracic aortic aneurysm ([place]) 04/26/2014  . CKD (chronic kidney disease) stage 3, GFR 30-59 ml/min 04/26/2014  . 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 Chest pain 04/15/2014  . Encounter for palliative care 04/06/2014  . Generalized weakness 04/06/2014  . Venous stasis dermatitis of both lower extremities   . Malnutrition of moderate degree ([place]) 03/31/2014  . Morbid obesity due to excess calories  ([place]) 03/29/2014  . 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 Poor compliance with advise  05/05/2012  . Annual physical exam 01/14/2012  . Weight loss 01/14/2012  . *Chronic lower extremity edema, stasis dermatitis 06/09/2011  . CARDIOMEGALY 04/29/2009  . *Prediabetes 09/07/2006  . Symptomatic anemia 09/07/2006  . Myeloproliferative disease ([place]) 09/07/2006  . Obstructive sleep apnea 09/07/2006  . Essential hypertension 09/07/2006  . Coronary atherosclerosis 09/07/2006  . Chronic diastolic CHF (congestive heart failure) ([place]) 09/07/2006  . Allergic rhinitis 09/07/2006    Past Surgical History:  Procedure Laterality Date  . TOE SURGERY Right    "tried to straighten out big toe" (07/11/2013)       Home Medications    Prior to Admission medications   Medication Sig Start Date End Date Taking? Authorizing Provider  acetaminophen (TYLENOL) 325 MG tablet Take 2 tablets (650 mg total) by mouth every 6 (six) hours as needed for mild pain, fever or headache. 11/16/16  Yes Short, Noah Delaine, MD  aspirin 325 MG tablet Take 325 mg by mouth daily.   Yes [provider]  atorvastatin (LIPITOR) 40 MG tablet Take 1 tablet (40 mg total) by mouth daily. 11/16/16 11/16/17 Yes Short, Noah Delaine, MD  B Complex Vitamins (VITAMIN B COMPLEX PO) Take 100 mg by mouth daily.   Yes [provider]  bisacodyl (DULCOLAX) 10 MG suppository Place 1 suppository (10 mg total) rectally daily as needed for moderate constipation. 04/18/16  Yes Hongalgi, Lenis Dickinson, MD  carvedilol (COREG) 3.125 MG tablet Take 1 tablet (3.125 mg total) by mouth 2 (two) times daily with a meal. 10/02/16  Yes Tillery, Satira Mccallum, PA-C  cefUROXime (CEFTIN) 250 MG tablet Take 1 tablet (250 mg total) by mouth 2 (two) times daily with a meal. 12/24/16  Yes Debbe Odea, MD  diclofenac sodium (VOLTAREN) 1 % GEL Apply 4 g topically 2 (two) times daily as needed (pain). Apply to bilateral  knees    Yes [provider]    feeding supplement, GLUCERNA SHAKE, (GLUCERNA SHAKE) LIQD Take 118.5 mLs by mouth daily. Reported on 08/21/2015   Yes [provider]  ferrous gluconate (FERGON) 324 MG tablet Take 1 tablet (324 mg total) by mouth 3 (three) times daily with meals. 11/16/16  Yes Short, Noah Delaine, MD  fluticasone (FLONASE) 50 MCG/ACT nasal spray Place 2 sprays into both nostrils daily. Reported on 05/02/2015   Yes [provider]  gabapentin (NEURONTIN) 100 MG capsule Take 2 capsules (200 mg total) by mouth 3 (three) times daily as needed (pain). Reported on 08/22/2015 10/17/16  Yes Rama, Venetia Maxon, MD  hydrALAZINE (APRESOLINE) 25 MG tablet Take 50 mg by mouth 3 (three) times daily.    Yes [provider]  hydrocerin (EUCERIN) CREA Apply Eucerin cream to BLE Q day after bathing and roughly towel drying to remove loose skin Patient taking differently: Apply 1 application topically daily as needed (dryness).  01/27/14  Yes Ghimire, Henreitta Leber, MD  HYDROcodone-acetaminophen (  NORCO/VICODIN) 5-325 MG tablet Take 1-2 tablets by mouth every 4 (four) hours as needed for moderate pain. 12/24/16  Yes Debbe Odea, MD  isosorbide mononitrate (IMDUR) 30 MG 24 hr tablet Take 30 mg by mouth 2 (two) times daily.   Yes [provider]  methocarbamol (ROBAXIN) 750 MG tablet Take 1 tablet (750 mg total) by mouth 3 (three) times daily. 10/17/16  Yes Rama, Venetia Maxon, MD  OXYGEN Inhale 2 L into the lungs continuous.   Yes [provider]  polyethylene glycol (MIRALAX / GLYCOLAX) packet Take 17 g by mouth daily. 12/24/16  Yes Debbe Odea, MD  simethicone (MYLICON) 80 MG chewable tablet Chew 80 mg by mouth every 6 (six) hours as needed for flatulence.   Yes [provider]  tamsulosin (FLOMAX) 0.4 MG CAPS capsule Take 1 capsule (0.4 mg total) by mouth daily. 12/24/16  Yes Debbe Odea, MD  colchicine 0.6 MG tablet Take 1 tablet (0.6 mg total) by mouth daily. Patient not taking: Reported on  12/29/2016 12/24/16   Debbe Odea, MD    Family History Family History  Problem Relation Age of Onset  . Schizophrenia Son   . Colon cancer Neg Hx   . Prostate cancer Neg Hx   . Heart attack Neg Hx   . Diabetes Neg Hx     Social History Social History  Substance Use Topics  . Smoking status: Former Smoker    Packs/day: 0.25    Years: 12.00    Types: Cigarettes    Quit date: 05/18/1966  . Smokeless tobacco: Never Used     Comment: quit smoking 45 years ago  . Alcohol use No     Allergies   Patient has no known allergies.   Review of Systems Review of Systems All other systems are reviewed and are negative for acute change except as noted in the HPI   Physical Exam Updated Vital Signs   Physical Exam  Constitutional: He is oriented to person, place, and time. He appears well-developed and well-nourished. No distress.  HENT:  Head: Normocephalic and atraumatic.  Right Ear: External ear normal.  Left Ear: External ear normal.  Nose: Nose normal.  Mouth/Throat: Mucous membranes are normal. No trismus in the jaw.  Eyes: Conjunctivae and EOM are normal. No scleral icterus.  Neck: Normal range of motion and phonation normal.  Cardiovascular: Normal rate and regular rhythm.   Pulmonary/Chest: Effort normal. No stridor. No respiratory distress.  Abdominal: He exhibits no distension. There is no tenderness.  Genitourinary:  Genitourinary Comments: Foley catheter in place. Dilation of the external urethral meatus. No surrounding erythema or discharge.  Musculoskeletal: Normal range of motion. He exhibits no edema.  Neurological: He is alert and oriented to person, place, and time.  Skin: He is not diaphoretic.  Psychiatric: He has a normal mood and affect. His behavior is normal.  Vitals reviewed.    ED Treatments / Results  Labs (all labs ordered are listed, but only abnormal results are displayed) Labs Reviewed  CBC WITH DIFFERENTIAL/PLATELET - Abnormal; Notable  for the following:       Result Value   WBC 11.0 (*)    RBC 4.02 (*)    Hemoglobin 10.5 (*)    HCT 34.1 (*)    RDW 19.9 (*)    Platelets 407 (*)    Basophils Absolute 0.3 (*)    All other components within normal limits  BASIC METABOLIC PANEL - Abnormal; Notable for the following:  Glucose, Bld 102 (*)    BUN 46 (*)    All other components within normal limits  URINALYSIS, ROUTINE W REFLEX MICROSCOPIC - Abnormal; Notable for the following:    Color, Urine AMBER (*)    APPearance HAZY (*)    Hgb urine dipstick SMALL (*)    Protein, ur >=300 (*)    All other components within normal limits  URINE CULTURE  PATHOLOGIST SMEAR REVIEW    EKG  EKG Interpretation None       Radiology No results found.  Procedures Procedures (including critical care time)  Medications Ordered in ED Medications - No data to display   Initial Impression / Assessment and Plan / ED Course  I have reviewed the triage vital signs and the nursing notes.  Pertinent labs & imaging results that were available during my care of the patient were reviewed by me and considered in my medical decision making (see chart for details).  Clinical Course as of Dec 30 1555  Tue Dec 29, 2016  1440 Had the pharmacy tech verify the patient's medication and contact the pharmacy. They report that the patient id not pick up his Ceftin and it is still waiting for him at the pharmacy.  [PC]  5374 Labs grossly reassuring. UA not consistent with a urinary tract infection. Sent Urine for culture.  The patient is safe for discharge with strict return precautions.  Recommended he follow-up with urology as scheduled for his urinary retention.  [PC]    Clinical Course User Index [PC] Emari Hreha, Grayce Sessions, MD      Final Clinical Impressions(s) / ED Diagnoses   Final diagnoses:  Foley catheter in place  Urinary retention   Disposition: Discharge  Condition: Good  I have discussed the results, Dx and Tx plan  with the patient who expressed understanding and agree(s) with the plan. Discharge instructions discussed at great length. The patient was given strict return precautions who verbalized understanding of the instructions. No further questions at time of discharge.    New Prescriptions   No medications on file    Follow Up: Irine Seal, MD [place] [address] [phone]   As scheduled for close follow up to assess for urinary retention      Shirell Struthers, Grayce Sessions, MD 12/29/16 1557

## 2016-12-30 LAB — URINE CULTURE
Culture: NO GROWTH
Special Requests: NORMAL

## 2016-12-31 LAB — PATHOLOGIST SMEAR REVIEW

## 2017-01-04 LAB — CHROMOSOME ANALYSIS, BONE MARROW

## 2017-01-06 ENCOUNTER — Ambulatory Visit (HOSPITAL_COMMUNITY)
Admission: RE | Admit: 2017-01-06 | Discharge: 2017-01-06 | Disposition: A | Discharge status: Home / Self Care | Payer: Medicare Other | Source: Ambulatory Visit | Attending: Cardiology | Admitting: Cardiology

## 2017-01-06 ENCOUNTER — Other Ambulatory Visit (HOSPITAL_COMMUNITY): Payer: Self-pay | Admitting: *Deleted

## 2017-01-06 VITALS — BP 140/66 | HR 57 | Wt 186.6 lb

## 2017-01-06 DIAGNOSIS — I712 Thoracic aortic aneurysm, without rupture: Secondary | ICD-10-CM | POA: Diagnosis not present

## 2017-01-06 DIAGNOSIS — E1122 Type 2 diabetes mellitus with diabetic chronic kidney disease: Secondary | ICD-10-CM | POA: Diagnosis not present

## 2017-01-06 DIAGNOSIS — G4733 Obstructive sleep apnea (adult) (pediatric): Secondary | ICD-10-CM | POA: Diagnosis not present

## 2017-01-06 DIAGNOSIS — Z87891 Personal history of nicotine dependence: Secondary | ICD-10-CM | POA: Diagnosis not present

## 2017-01-06 DIAGNOSIS — I130 Hypertensive heart and chronic kidney disease with heart failure and stage 1 through stage 4 chronic kidney disease, or unspecified chronic kidney disease: Secondary | ICD-10-CM | POA: Diagnosis not present

## 2017-01-06 DIAGNOSIS — Z818 Family history of other mental and behavioral disorders: Secondary | ICD-10-CM | POA: Insufficient documentation

## 2017-01-06 DIAGNOSIS — I5032 Chronic diastolic (congestive) heart failure: Secondary | ICD-10-CM | POA: Diagnosis not present

## 2017-01-06 DIAGNOSIS — I10 Essential (primary) hypertension: Secondary | ICD-10-CM | POA: Diagnosis not present

## 2017-01-06 DIAGNOSIS — N183 Chronic kidney disease, stage 3 (moderate): Secondary | ICD-10-CM | POA: Diagnosis not present

## 2017-01-06 DIAGNOSIS — Z7982 Long term (current) use of aspirin: Secondary | ICD-10-CM | POA: Diagnosis not present

## 2017-01-06 DIAGNOSIS — C946 Myelodysplastic disease, not classified: Secondary | ICD-10-CM | POA: Diagnosis not present

## 2017-01-06 DIAGNOSIS — R0602 Shortness of breath: Secondary | ICD-10-CM | POA: Insufficient documentation

## 2017-01-06 DIAGNOSIS — I2510 Atherosclerotic heart disease of native coronary artery without angina pectoris: Secondary | ICD-10-CM | POA: Diagnosis not present

## 2017-01-06 DIAGNOSIS — I5042 Chronic combined systolic (congestive) and diastolic (congestive) heart failure: Secondary | ICD-10-CM | POA: Insufficient documentation

## 2017-01-06 DIAGNOSIS — D471 Chronic myeloproliferative disease: Secondary | ICD-10-CM | POA: Diagnosis not present

## 2017-01-06 DIAGNOSIS — Z79899 Other long term (current) drug therapy: Secondary | ICD-10-CM | POA: Diagnosis not present

## 2017-01-06 LAB — BASIC METABOLIC PANEL
Anion gap: 9 (ref 5–15)
BUN: 39 mg/dL — AB (ref 6–20)
CO2: 23 mmol/L (ref 22–32)
Calcium: 9.8 mg/dL (ref 8.9–10.3)
Chloride: 109 mmol/L (ref 101–111)
Creatinine, Ser: 1.06 mg/dL (ref 0.61–1.24)
Glucose, Bld: 113 mg/dL — ABNORMAL HIGH (ref 65–99)
POTASSIUM: 4.6 mmol/L (ref 3.5–5.1)
SODIUM: 141 mmol/L (ref 135–145)

## 2017-01-06 LAB — BRAIN NATRIURETIC PEPTIDE: B NATRIURETIC PEPTIDE 5: 435.8 pg/mL — AB (ref 0.0–100.0)

## 2017-01-06 MED ORDER — NITROGLYCERIN 0.4 MG SL SUBL: 0.4000 mg | SUBLINGUAL_TABLET | SUBLINGUAL | 0 refills | Status: DC | PRN

## 2017-01-06 MED FILL — POTASSIUM CL ER 20 MEQ TABL: 20 | 34 days supply | Qty: 34 | Fill #0

## 2017-01-06 NOTE — Progress Notes (Signed)
Patient ID: Juan [name], male   DOB: Feb 26, 1941, 76 y.o.   MRN: [id number]    Advanced Heart Failure Clinic Note   PCP: Dr Larose Kells  Primary HF Cardiologist: Dr Haroldine Laws  Primary Cardiologist: Dr Johnsie Cancel   HPI: Juan [name] is a 76 y.o. male with history of Diastolic HF, OSA, HTN, DM2, and myeloproliferative disorder followed by Oncology.   He was seen by onc 10/22/14 for his myeloproliferative disorder. 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   Admitted to [facility] 01/2015 with increased dyspnea and volume overload. Diuresed with lasix and transitioned to lasix 40 mg three times a day.   Admitted twice in  December 2017 with volume overload.  Diuresed with IV lasix the transitioned to lasix 40 mg twice a day. Discharge weight 198 pounds. Discharged to SNF.    Admitted 10/08/16-10/17/16 with volume overload. PA pressure on Echo was 85 mmHg. This was a newly high PA pressure for him. VQ scan negative for PE. ANA, RA and HIV negative. He was diuresed with IV lasix about 15 pounds. Discharge weight was 194 pounds. It was recommended that he go to SNF, but he preferred to go home with home health. He also was started on home 02 at 2L.   Admitted earlier 12/2016 with hip and flank pain. Torsemide and metolazone held at discharge. Per note because patient "wasn't in a CHF flare".   He presents today for HF follow up.  Has missed several visits. Has been in and out of ED as well. Having on going problems with UTI. Has foley in currently. Plans to have d/c next month. Does have mild SOB walking around a room. Usually OK at rest. Occasional orthopnea. Denies dizziness or lightheadedness. Denies CP. Doesn't really watch his salt or fluid.   Echo 01/21/15 EF 30%, moderate LVH, mild MR, PA peak pressure 59 mm Hg, Normal RV ECHO 12/2015: Ef 55-60%. Grade IDD Echo 09/2016 EF 55-60%, PA pressure 85 mm Hg.   Labs  01/29/2015: K 3.9 Creatinine 1.59  Labs 02/14/15: K 3.9 Creatinine 1.72  Labs 02/22/2015: K 4.1 Creatinine 1.82  Labs 05/23/2015: K 5.2 Creatinine 1.39  Labs 07/28/2016: K 4.6 Creatinine 1.29   Review of systems complete and found to be negative unless listed in HPI.    SH:  Social History   Social History  . Marital status: Widowed    Spouse name: N/A  . Number of children: 2  . Years of education: N/A   Occupational History  . retired    .  Retired   Social History Main Topics  . Smoking status: Former Smoker    Packs/day: 0.25    Years: 12.00    Types: Cigarettes    Quit date: 05/18/1966  . Smokeless tobacco: Never Used     Comment: quit smoking 45 years ago  . Alcohol use No  . Drug use: No  . Sexual activity: Yes   Other Topics Concern  . Not on file   Social History Narrative   Moved from [place] 2006   Widow 2007   Son lives with him    FH: Family History  Problem Relation Age of Onset  . Schizophrenia Son   . Colon cancer Neg Hx   . Prostate cancer Neg Hx   . Heart attack Neg Hx   . Diabetes Neg Hx     Past Medical History:  Diagnosis  Date  . Allergic rhinitis   . Anemia   . Ascending aortic aneurysm ([place]) 03/2014   4.3cm on CT scan  . CAD (coronary artery disease)    dx elsewheer in past, no documentation. Non-ischemic myoview 2007  . Chronic diastolic CHF (congestive heart failure), NYHA class 2 (HCC)    Normal EF w/ grade 1 dd by echo 12/2015  . Edema    R>L leg, u/s 5-12 neg for DVT  . Hemorrhoid   . History of thrombocytosis   . Hypertension   . Migraine    "once/wk at least" (07/11/2013)  . Myeloproliferative disease ([place])   . Shortness of breath   . Sinus congestion   . Sleep apnea, obstructive    at some point used CPAP, was d/c  years ago  . Type II diabetes mellitus ([place])     Current Outpatient Prescriptions  Medication Sig Dispense Refill  . acetaminophen (TYLENOL) 325 MG tablet Take 2 tablets (650 mg total) by mouth every 6 (six)  hours as needed for mild pain, fever or headache.    Marland Kitchen aspirin 325 MG tablet Take 325 mg by mouth daily.    Marland Kitchen atorvastatin (LIPITOR) 40 MG tablet Take 1 tablet (40 mg total) by mouth daily. 30 tablet 0  . B Complex Vitamins (VITAMIN B COMPLEX PO) Take 100 mg by mouth daily.    . bisacodyl (DULCOLAX) 10 MG suppository Place 1 suppository (10 mg total) rectally daily as needed for moderate constipation.    . carvedilol (COREG) 3.125 MG tablet Take 1 tablet (3.125 mg total) by mouth 2 (two) times daily with a meal. 60 tablet 1  . cefUROXime (CEFTIN) 250 MG tablet Take 1 tablet (250 mg total) by mouth 2 (two) times daily with a meal. 5 tablet 0  . colchicine 0.6 MG tablet Take 1 tablet (0.6 mg total) by mouth daily. (Patient not taking: Reported on 12/29/2016) 20 tablet 0  . diclofenac sodium (VOLTAREN) 1 % GEL Apply 4 g topically 2 (two) times daily as needed (pain). Apply to bilateral  knees     . feeding supplement, GLUCERNA SHAKE, (GLUCERNA SHAKE) LIQD Take 118.5 mLs by mouth daily. Reported on 08/21/2015    . ferrous gluconate (FERGON) 324 MG tablet Take 1 tablet (324 mg total) by mouth 3 (three) times daily with meals. 90 tablet 0  . fluticasone (FLONASE) 50 MCG/ACT nasal spray Place 2 sprays into both nostrils daily. Reported on 05/02/2015    . gabapentin (NEURONTIN) 100 MG capsule Take 2 capsules (200 mg total) by mouth 3 (three) times daily as needed (pain). Reported on 08/22/2015 60 capsule 0  . hydrALAZINE (APRESOLINE) 25 MG tablet Take 50 mg by mouth 3 (three) times daily.     . hydrocerin (EUCERIN) CREA Apply Eucerin cream to BLE Q day after bathing and roughly towel drying to remove loose skin (Patient taking differently: Apply 1 application topically daily as needed (dryness). ) 228 g 0  . HYDROcodone-acetaminophen (NORCO/VICODIN) 5-325 MG tablet Take 1-2 tablets by mouth every 4 (four) hours as needed for moderate pain. 30 tablet 0  . isosorbide mononitrate (IMDUR) 30 MG 24 hr tablet Take 30  mg by mouth 2 (two) times daily.    . methocarbamol (ROBAXIN) 750 MG tablet Take 1 tablet (750 mg total) by mouth 3 (three) times daily. 30 tablet 0  . OXYGEN Inhale 2 L into the lungs continuous.    . polyethylene glycol (MIRALAX / GLYCOLAX) packet Take 17 g by  mouth daily. 14 each 0  . simethicone (MYLICON) 80 MG chewable tablet Chew 80 mg by mouth every 6 (six) hours as needed for flatulence.    . tamsulosin (FLOMAX) 0.4 MG CAPS capsule Take 1 capsule (0.4 mg total) by mouth daily. 30 capsule 0   No current facility-administered medications for this encounter.     Vitals:   01/06/17 0908  BP: 140/66  Pulse: (!) 57  SpO2: 93%  Weight: 186 lb 9.6 oz (84.6 kg)   Wt Readings from Last 3 Encounters:  01/06/17 186 lb 9.6 oz (84.6 kg)  12/29/16 186 lb (84.4 kg)  12/24/16 192 lb 7.4 oz (87.3 kg)     PHYSICAL EXAM: General: Chronically ill and cachectic appearing. Elderly. NAD. In [place].   HEENT: Normal.  Neck: supple. JVP 7-8 cm   Carotids 2+ bilaterally; no bruits. No thyromegaly or lymphadenopathy noted. Cor: PMI normal. RRR. No M/G/R.  Lungs: Clear. No W/R/R.   Abdomen: Obese, NT, ND, no HSM. No bruits or masses. +BS  Extremities: No cyanosis or clubbing. Trace to 1+ edema to knees.   Neuro: Alert & oriented x 3. Cranial nerves grossly intact. Moves all 4 extremities w/o difficulty. Affect pleasant   GU: Foley. Dark urine, ? Pink. Has PCP follow up.   ASSESSMENT & PLAN: 1. Chronic combined systolic and diastolic CHF, EF 75% echo 01/21/15, Normal myoview 2007.  EF now 55-60% - NYHA class III symptoms at baseline.  - Volume status looks OK on exam.   - Continue lasix 40 mg daily. Continue potassium 20 meq daily for now.  - Continue imdur 30 mg BID - Resume hydralazine 50 mg TID.  - Continue coreg 3.125 mg BID - No Spiro with history of noncompliance.  - Reinforced fluid restriction to < 2 L daily, sodium restriction to less than 2000 mg daily, and the importance of daily weights.      2. CKD stage III  - Previously perceived baseline 1.7-1.9. More recently has been in 1.0 range.  3. HTN - BP mildly elevated.  His meds have changed greatly since his last visit with [place]. Will attempt to clarify his medications orders.   4. 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 - He has refused biopsy and further work up many times in the past. No change.  - Pt looks chronically ill and has been losing weight.  ? Progression of this disease. He has refused any further work up, as above.  - He states he plans to make appt with Dr. Marin Olp.  Meds as above. Labs today. RTC 2 months.   Shirley Friar, PA-C  01/06/17   Greater than 50% of the 25 minute visit was spent in counseling/coordination of care regarding disease state education, sliding scale diuretics, salt/fluid restriction, and medication reconciliation.

## 2017-01-06 NOTE — Progress Notes (Signed)
Advanced Heart Failure Medication Review by a Pharmacist  Does the patient  feel that his/her medications are working for him/her?  yes  Has the patient been experiencing any side effects to the medications prescribed?  no  Does the patient measure his/her own blood pressure or blood glucose at home?  no   Does the patient have any problems obtaining medications due to transportation or finances?   Yes - No Rx insurance - using HF fund  Understanding of regimen: fair Understanding of indications: poor Potential of compliance: poor Patient understands to avoid NSAIDs. Patient understands to avoid decongestants.  Issues to address at subsequent visits: Compliance   Pharmacist comments: Juan [name] is a pleasant 75 yo M presenting with his medication bottles. He reports fair-poor compliance with his regimen admitting to only taking his KCl PRN for leg cramps and his hydralazine a "few times" since discharge since he wasn't sure if he should restart it. He does have a bottle of hydralazine 25 mg and 50 mg and also has torsemide and furosemide. He did not have any specific medication-related questions or concerns for me at this time.   [name]. Velva Harman, PharmD, BCPS, CPP Clinical Pharmacist Pager: [phone] Phone: [phone] 01/06/2017 9:04 AM      Time with patient: 20 minutes Preparation and documentation time: 2 minutes Total time: 22 minutes

## 2017-01-06 NOTE — Patient Instructions (Signed)
Routine lab work today. Will notify you of abnormal results, otherwise no news is good news!  Take Hydralazine 50 mg tablet three times daily.  Take Lasix 40 mg tablet once daily.  Refill sent for Nitroglycerin.  Follow up 2 months with Oda Kilts PA-C.  Take all medication as prescribed the day of your appointment. Bring all medications with you to your appointment.  Do the following things EVERYDAY: 1) Weigh yourself in the morning before breakfast. Write it down and keep it in a log. 2) Take your medicines as prescribed 3) Eat low salt foods-Limit salt (sodium) to 2000 mg per day.  4) Stay as active as you can everyday 5) Limit all fluids for the day to less than 2 liters

## 2017-01-09 ENCOUNTER — Emergency Department (HOSPITAL_COMMUNITY): Payer: Medicare Other

## 2017-01-09 ENCOUNTER — Emergency Department (HOSPITAL_COMMUNITY)
Admission: EM | Admit: 2017-01-09 | Discharge: 2017-01-09 | Disposition: A | Discharge status: Home / Self Care | Payer: Medicare Other | Attending: Emergency Medicine | Admitting: Emergency Medicine

## 2017-01-09 ENCOUNTER — Encounter (HOSPITAL_COMMUNITY): Payer: Self-pay

## 2017-01-09 DIAGNOSIS — I5032 Chronic diastolic (congestive) heart failure: Secondary | ICD-10-CM | POA: Diagnosis not present

## 2017-01-09 DIAGNOSIS — M79671 Pain in right foot: Secondary | ICD-10-CM

## 2017-01-09 DIAGNOSIS — N183 Chronic kidney disease, stage 3 (moderate): Secondary | ICD-10-CM | POA: Insufficient documentation

## 2017-01-09 DIAGNOSIS — T8189XA Other complications of procedures, not elsewhere classified, initial encounter: Secondary | ICD-10-CM | POA: Diagnosis not present

## 2017-01-09 DIAGNOSIS — I2510 Atherosclerotic heart disease of native coronary artery without angina pectoris: Secondary | ICD-10-CM | POA: Insufficient documentation

## 2017-01-09 DIAGNOSIS — M7989 Other specified soft tissue disorders: Secondary | ICD-10-CM | POA: Diagnosis not present

## 2017-01-09 DIAGNOSIS — L03115 Cellulitis of right lower limb: Secondary | ICD-10-CM | POA: Diagnosis not present

## 2017-01-09 DIAGNOSIS — D72829 Elevated white blood cell count, unspecified: Secondary | ICD-10-CM | POA: Diagnosis not present

## 2017-01-09 DIAGNOSIS — D473 Essential (hemorrhagic) thrombocythemia: Secondary | ICD-10-CM | POA: Diagnosis not present

## 2017-01-09 DIAGNOSIS — L0390 Cellulitis, unspecified: Secondary | ICD-10-CM | POA: Diagnosis not present

## 2017-01-09 DIAGNOSIS — Z79899 Other long term (current) drug therapy: Secondary | ICD-10-CM | POA: Diagnosis not present

## 2017-01-09 DIAGNOSIS — D649 Anemia, unspecified: Secondary | ICD-10-CM | POA: Diagnosis not present

## 2017-01-09 DIAGNOSIS — E119 Type 2 diabetes mellitus without complications: Secondary | ICD-10-CM | POA: Insufficient documentation

## 2017-01-09 DIAGNOSIS — Z7982 Long term (current) use of aspirin: Secondary | ICD-10-CM | POA: Diagnosis not present

## 2017-01-09 DIAGNOSIS — I130 Hypertensive heart and chronic kidney disease with heart failure and stage 1 through stage 4 chronic kidney disease, or unspecified chronic kidney disease: Secondary | ICD-10-CM | POA: Diagnosis not present

## 2017-01-09 DIAGNOSIS — M25532 Pain in left wrist: Secondary | ICD-10-CM | POA: Diagnosis not present

## 2017-01-09 DIAGNOSIS — B999 Unspecified infectious disease: Secondary | ICD-10-CM | POA: Diagnosis not present

## 2017-01-09 DIAGNOSIS — Z87891 Personal history of nicotine dependence: Secondary | ICD-10-CM | POA: Diagnosis not present

## 2017-01-09 LAB — CBC WITH DIFFERENTIAL/PLATELET
BASOS ABS: 0.7 10*3/uL — AB (ref 0.0–0.1)
Basophils Relative: 6 %
Eosinophils Absolute: 0.2 10*3/uL (ref 0.0–0.7)
Eosinophils Relative: 2 %
HCT: 37.3 % — ABNORMAL LOW (ref 39.0–52.0)
Hemoglobin: 11.3 g/dL — ABNORMAL LOW (ref 13.0–17.0)
LYMPHS ABS: 2.4 10*3/uL (ref 0.7–4.0)
Lymphocytes Relative: 20 %
MCH: 26.2 pg (ref 26.0–34.0)
MCHC: 30.3 g/dL (ref 30.0–36.0)
MCV: 86.5 fL (ref 78.0–100.0)
MONO ABS: 2.4 10*3/uL — AB (ref 0.1–1.0)
Monocytes Relative: 20 %
NEUTROS ABS: 6.2 10*3/uL (ref 1.7–7.7)
Neutrophils Relative %: 52 %
PLATELETS: 478 10*3/uL — AB (ref 150–400)
RBC: 4.31 MIL/uL (ref 4.22–5.81)
RDW: 20.4 % — AB (ref 11.5–15.5)
WBC: 11.9 10*3/uL — AB (ref 4.0–10.5)

## 2017-01-09 LAB — COMPREHENSIVE METABOLIC PANEL
ALBUMIN: 3.1 g/dL — AB (ref 3.5–5.0)
ALK PHOS: 120 U/L (ref 38–126)
ALT: 14 U/L — AB (ref 17–63)
AST: 23 U/L (ref 15–41)
Anion gap: 8 (ref 5–15)
BILIRUBIN TOTAL: 0.3 mg/dL (ref 0.3–1.2)
BUN: 32 mg/dL — AB (ref 6–20)
CALCIUM: 9.6 mg/dL (ref 8.9–10.3)
CO2: 24 mmol/L (ref 22–32)
CREATININE: 0.95 mg/dL (ref 0.61–1.24)
Chloride: 109 mmol/L (ref 101–111)
GFR calc Af Amer: >60 mL/min (ref >60)
GLUCOSE: 96 mg/dL (ref 65–99)
Potassium: 4.9 mmol/L (ref 3.5–5.1)
Sodium: 141 mmol/L (ref 135–145)
TOTAL PROTEIN: 5.8 g/dL — AB (ref 6.5–8.1)

## 2017-01-09 LAB — I-STAT CG4 LACTIC ACID, ED: Lactic Acid, Venous: 0.99 mmol/L (ref 0.5–1.9)

## 2017-01-09 MED ORDER — INDOMETHACIN 50 MG PO CAPS: 50.0000 mg | ORAL_CAPSULE | Freq: Three times a day (TID) | ORAL | 0 refills | Status: DC

## 2017-01-09 MED ORDER — HYDROMORPHONE HCL 1 MG/ML IJ SOLN
0.5000 mg | Freq: Once | INTRAMUSCULAR | Status: AC
  Administered 2017-01-09: 0.5 mg via INTRAMUSCULAR
  Filled 2017-01-09: qty 1

## 2017-01-09 MED ORDER — DOXYCYCLINE HYCLATE 100 MG PO CAPS: 100.0000 mg | ORAL_CAPSULE | Freq: Two times a day (BID) | ORAL | 0 refills | Status: DC

## 2017-01-09 NOTE — Discharge Instructions (Signed)
Please take all of your antibiotics until finished!   You may develop abdominal discomfort or diarrhea from the antibiotic.  You may help offset this with probiotics which you can buy or get in yogurt. Do not eat  or take the probiotics until 2 hours after your antibiotic.   Take Indomethacin 3 times a day for gout flare.   Please return if redness spreads up your leg, you develop a fever that will not go away, pain does not improve.

## 2017-01-09 NOTE — ED Provider Notes (Signed)
[facility] DEPT Provider Note   CSN: [id number] Arrival date & time: 01/09/17  1451     History   Chief Complaint Chief Complaint  Patient presents with  . Foot Pain    HPI Juan [name] is a 76 y.o. male.  HPI   Juan [name] is a 76 year old male with a history of gout, venous stasis dermatitis, type two diabetes, CKD Stage 3 with indwelling catheter who presents to the Emergency department for evaluation of painful red foot. Patient states that he woke up this morning with 10/10 aching pain on is right foot and lower leg. He also states that someone checked his temperature and told him he had a fever, unsure of what his temperature was at the time. He states that walking on the foot worsens the pain. Tried to put a topical cream on the foot for pain relief, but it was unhelpful He says that he has had a gout flare in the past, that it involved his hand and never his foot. He denies pain elsewhere, denies purulence. Denies numbness, weakness, loss of range of motion of the foot.   Past Medical History:  Diagnosis Date  . Allergic rhinitis   . Anemia   . Ascending aortic aneurysm ([place]) 03/2014   4.3cm on CT scan  . CAD (coronary artery disease)    dx elsewheer in past, no documentation. Non-ischemic myoview 2007  . Chronic diastolic CHF (congestive heart failure), NYHA class 2 (HCC)    Normal EF w/ grade 1 dd by echo 12/2015  . Edema    R>L leg, u/s 5-12 neg for DVT  . Hemorrhoid   . History of thrombocytosis   . Hypertension   . Migraine    "once/wk at least" (07/11/2013)  . Myeloproliferative disease ([place])   . Shortness of breath   . Sinus congestion   . Sleep apnea, obstructive    at some point used CPAP, was d/c  years ago  . Type II diabetes mellitus [facility])     Patient Active Problem List   Diagnosis Date Noted  . Right hip pain 12/24/2016  . 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 Muscle spasm 11/16/2016  . Strep throat 11/16/2016  . Neck  pain 11/14/2016  . Elevated troponin 11/14/2016  . Arthritis   . Recurrent pneumonia 10/31/2016  . Acute extremity pain 10/31/2016  . HCAP (healthcare-associated pneumonia) 10/31/2016  . 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 Pulmonary hypertension ([place])   . Sprain of left rotator cuff capsule   . Hyperkalemia   . Acute kidney injury superimposed on CKD ([place])   . Fever 10/08/2016  . Left arm pain 10/08/2016  . Paroxysmal SVT (supraventricular tachycardia) ([place]) 04/20/2016  . Type II diabetes mellitus ([place]) 04/20/2016  . Gout 04/20/2016  . Hyperuricemia 04/20/2016  . B12 deficiency 04/20/2016  . 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 Hydronephrosis   . Thrombocytosis ([place]) 04/07/2016  . Anemia 04/07/2016  . Community acquired pneumonia 04/01/2016  . Pneumonia 04/01/2016  . Pressure injury of skin 04/01/2016  . Pressure ulcer stage II 12/31/2015  . UTI (urinary tract infection) 12/30/2015  . Sepsis ([place]) 12/29/2015  . Acute lower UTI 12/29/2015  . T wave inversion in EKG 12/29/2015  . PCP NOTES >>>>>>>>>>>>>>>>> 05/02/2015  . Arm pain   . Peripheral edema   . Unable to ambulate   . Thoracic aortic aneurysm ([place]) 04/26/2014  . CKD (chronic kidney disease) stage 3, GFR 30-59 ml/min 04/26/2014  . *  Neuropathy, on neurontin, occ hydrocodone 04/26/2014  . Chest pain 04/15/2014  . Encounter for palliative care 04/06/2014  . Generalized weakness 04/06/2014  . Venous stasis dermatitis of both lower extremities   . Malnutrition of moderate degree ([place]) 03/31/2014  . Morbid obesity due to excess calories ([place]) 03/29/2014  . 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 Poor compliance with advise  05/05/2012  . Annual physical exam 01/14/2012  . Weight loss 01/14/2012  . *Chronic lower extremity edema, stasis dermatitis 06/09/2011  . CARDIOMEGALY 04/29/2009  . *Prediabetes 09/07/2006  . Symptomatic anemia 09/07/2006  . Myeloproliferative disease ([place]) 09/07/2006  .  Obstructive sleep apnea 09/07/2006  . Essential hypertension 09/07/2006  . Coronary atherosclerosis 09/07/2006  . Chronic diastolic CHF (congestive heart failure) ([place]) 09/07/2006  . Allergic rhinitis 09/07/2006    Past Surgical History:  Procedure Laterality Date  . TOE SURGERY Right    "tried to straighten out big toe" (07/11/2013)       Home Medications    Prior to Admission medications   Medication Sig Start Date End Date Taking? Authorizing Provider  acetaminophen (TYLENOL) 500 MG tablet Take 500 mg by mouth every 6 (six) hours as needed for mild pain or moderate pain.   Yes [provider]  aspirin 325 MG tablet Take 325 mg by mouth daily.   Yes [provider]  B Complex Vitamins (VITAMIN B COMPLEX PO) Take 100 mg by mouth daily.   Yes [provider]  bisacodyl (DULCOLAX) 10 MG suppository Place 1 suppository (10 mg total) rectally daily as needed for moderate constipation. 04/18/16  Yes Hongalgi, Lenis Dickinson, MD  feeding supplement, GLUCERNA SHAKE, (GLUCERNA SHAKE) LIQD Take 118.5 mLs by mouth daily. Reported on 08/21/2015   Yes [provider]  ferrous gluconate (FERGON) 240 (27 FE) MG tablet Take 240 mg by mouth daily.   Yes [provider]  fluticasone (FLONASE) 50 MCG/ACT nasal spray Place 2 sprays into both nostrils daily. Reported on 05/02/2015   Yes [provider]  furosemide (LASIX) 40 MG tablet Take 40 mg by mouth daily.   Yes [provider]  gabapentin (NEURONTIN) 100 MG capsule Take 2 capsules (200 mg total) by mouth 3 (three) times daily as needed (pain). Reported on 08/22/2015 10/17/16  Yes Rama, Venetia Maxon, MD  hydrocerin (EUCERIN) CREA Apply Eucerin cream to BLE Q day after bathing and roughly towel drying to remove loose skin Patient taking differently: Apply 1 application topically daily as needed (dryness).  01/27/14  Yes Ghimire, Henreitta Leber, MD  nitroGLYCERIN (NITROSTAT) 0.4 MG SL tablet Place 1 tablet (0.4  mg total) under the tongue every 5 (five) minutes as needed for chest pain. 01/06/17 04/06/17 Yes TillerySatira Mccallum, PA-C  OXYGEN Inhale 2 L into the lungs continuous.   Yes [provider]  potassium chloride SA (K-DUR,KLOR-CON) 20 MEQ tablet Take 20 mEq by mouth as needed (leg cramps).   Yes [provider]  acetaminophen (TYLENOL) 325 MG tablet Take 2 tablets (650 mg total) by mouth every 6 (six) hours as needed for mild pain, fever or headache. Patient not taking: Reported on 01/09/2017 11/16/16   Janece Canterbury, MD  diclofenac sodium (VOLTAREN) 1 % GEL Apply 4 g topically 2 (two) times daily as needed (pain). Apply to bilateral  knees     [provider]  methocarbamol (ROBAXIN) 750 MG tablet Take 1 tablet (750 mg total) by mouth 3 (three) times daily. Patient not taking: Reported on 01/09/2017 10/17/16  Rama, Venetia Maxon, MD    Family History Family History  Problem Relation Age of Onset  . Schizophrenia Son   . Colon cancer Neg Hx   . Prostate cancer Neg Hx   . Heart attack Neg Hx   . Diabetes Neg Hx     Social History Social History  Substance Use Topics  . Smoking status: Former Smoker    Packs/day: 0.25    Years: 12.00    Types: Cigarettes    Quit date: 05/18/1966  . Smokeless tobacco: Never Used     Comment: quit smoking 45 years ago  . Alcohol use No     Allergies   Patient has no known allergies.   Review of Systems Review of Systems  Constitutional: Positive for chills and fever. Negative for fatigue.  HENT: Negative for sore throat.   Eyes: Negative for pain.  Respiratory: Positive for shortness of breath (on 2L of oxygen chronically). Negative for cough and wheezing.   Cardiovascular: Negative for chest pain and palpitations.  Gastrointestinal: Negative for abdominal pain, diarrhea, nausea and vomiting.  Genitourinary: Positive for difficulty urinating (foley catheter in place).  Musculoskeletal: Positive for arthralgias (right  foot pain) and joint swelling (right foot and lower leg). Negative for back pain.  Skin: Positive for color change (red right foot and lower leg). Negative for wound.  Neurological: Negative for weakness, numbness and headaches.  Psychiatric/Behavioral: Negative for agitation.     Physical Exam Updated Vital Signs BP (!) 157/72 (BP Location: Left Arm)   Pulse 61   Temp (!) 97.5 F (36.4 C) (Oral)   Resp 16   Ht 6\' 3"  (1.905 m)   Wt 83.5 kg (184 lb)   SpO2 100%   BMI 23.00 kg/m   Physical Exam  Constitutional: He is oriented to person, place, and time. He appears well-developed and well-nourished. No distress.  HENT:  Head: Normocephalic and atraumatic.  Eyes: Pupils are equal, round, and reactive to light. Right eye exhibits no discharge. Left eye exhibits no discharge.  Cardiovascular: Normal rate and regular rhythm.   Pulses:      Dorsalis pedis pulses are 2+ on the right side, and 2+ on the left side.       Posterior tibial pulses are 2+ on the right side, and 2+ on the left side.  Pulmonary/Chest: Effort normal. No respiratory distress.  Abdominal: Soft. There is no tenderness.  Musculoskeletal:       Right foot: There is normal range of motion and no deformity.       Left foot: There is normal range of motion and no deformity.  Feet:  Right Foot:  Skin Integrity: Right foot warmth: with tenderness to palpation over the right toes and doral foot.  Neurological: He is alert and oriented to person, place, and time. Coordination normal.  Distal sensation intact to light palpation in all four extremities.  Skin: He is not diaphoretic.  Erythema and warmth circumferentially to the right lower leg. Erythema and warmth to the right medial foot and toes.   Psychiatric: He has a normal mood and affect. His behavior is normal.  Nursing note and vitals reviewed.    ED Treatments / Results  Labs (all labs ordered are listed, but only abnormal results are displayed) Labs  Reviewed  COMPREHENSIVE METABOLIC PANEL - Abnormal; Notable for the following:       Result Value   BUN 32 (*)    Total Protein 5.8 (*)    Albumin  3.1 (*)    ALT 14 (*)    All other components within normal limits  CBC WITH DIFFERENTIAL/PLATELET - Abnormal; Notable for the following:    WBC 11.9 (*)    Hemoglobin 11.3 (*)    HCT 37.3 (*)    RDW 20.4 (*)    Platelets 478 (*)    Monocytes Absolute 2.4 (*)    Basophils Absolute 0.7 (*)    All other components within normal limits  I-STAT CG4 LACTIC ACID, ED  I-STAT CG4 LACTIC ACID, ED    EKG  EKG Interpretation None       Radiology Dg Foot Complete Right  Result Date: 01/09/2017 CLINICAL DATA:  Foot pain and swelling. EXAM: RIGHT FOOT COMPLETE - 3+ VIEW COMPARISON:  None. FINDINGS: No acute fracture or malalignment. Hallux valgus with bony bunion. Osteopenic appearance with trabecular coarsening. Patient has history of thrombocytosis. IMPRESSION: 1. No acute finding. 2. Prominent hallux valgus. Electronically Signed   By: Monte Fantasia M.D.   On: 01/09/2017 14:17    Procedures Procedures (including critical care time)  Medications Ordered in ED Medications  HYDROmorphone (DILAUDID) injection 0.5 mg (not administered)     Initial Impression / Assessment and Plan / ED Course  I have reviewed the triage vital signs and the nursing notes.  Pertinent labs & imaging results that were available during my care of the patient were reviewed by me and considered in my medical decision making (see chart for details).   Patient presents with right foot pain and swelling occurring this morning. He has a history of gout in the past. On exam the right foot and lower leg are edematous, erythematous and tender to palpation. Otherwise neurovascularly intact and ROM good. Doubt septic arthritis as the pain extends from toe to lower leg and patient is able to move joints in the right lower extremity with ease.  Xray of the right foot shows  no fracture or evidence of osteomyelitis.   Will treat him for cellulitis with antibiotics. Also, because of his history of gout, and elevated uric acid levels will also send home with indomethacin for gout flare. Pt is without risk factors for HIV; no recent use of steroids or other immunosuppressive medications;  Pt is without gross abscess for which I&D would be possible.  Patient encouraged to return if redness begins to streak, extends beyond the markings, and/or fever or nausea/vomiting develop.  Pt is alert, oriented, NAD, afebrile, non tachycardic, nonseptic and nontoxic appearing.  Pt to be d/c on oral antibiotics with strict f/u instructions.    He has also been instructed to take indomethacin TID with meals for potential gout flare which is contributing to this. Discussed this plan with Dr. [name] and he agrees. Return precautions discussed and patient voices understanding.    Final Clinical Impressions(s) / ED Diagnoses   Final diagnoses:  None    New Prescriptions New Prescriptions   No medications on file     Bernarda Caffey 01/09/17 1945    Glyn Ade, PA-C 01/09/17 2337    Noemi Chapel, MD 01/10/17 [phone]

## 2017-01-09 NOTE — ED Triage Notes (Signed)
Pt woke up this AM with right foot pain and swelling. Hx of severe gout and hospitalization recently. PT has foley cath in place.   168/72 Hr 74 spo2 97% 2lpm cbg 134

## 2017-01-09 NOTE — ED Notes (Signed)
[facility] contacted to transport patient back to home

## 2017-01-09 NOTE — ED Provider Notes (Signed)
The patient is a 76 year old male,  has an indwelling Foley catheter, presents with a couple of days of right foot pain and swelling with associated patch of erythema to his distal anterior right lower extremity above the ankle. There is warmth, mild induration but no fluctuance. He has normal range motion of the ankle. He has decreased range of motion of the great toe on the right foot secondary to pain redness and swelling.  The patient does have a slight leukocytosis, he is afebrile, his vital signs are otherwise unremarkable, the patient will be treated for bacterial infection as well as some indomethacin for the pain as if this could be possibly gout in the foot as well. He is nontoxic appearing and can be discharged back to his nursing facility. Indications for return have been explained the discharge instructions.  Medical screening examination/treatment/procedure(s) were conducted as a shared visit with non-physician practitioner(s) and myself.  I personally evaluated the patient during the encounter.  Clinical Impression:   Final diagnoses:  Pain in right foot  Cellulitis of right lower extremity         Noemi Chapel, MD 01/10/17 0004

## 2017-01-10 ENCOUNTER — Encounter (HOSPITAL_COMMUNITY): Payer: Self-pay

## 2017-01-10 ENCOUNTER — Emergency Department (HOSPITAL_COMMUNITY)
Admission: EM | Admit: 2017-01-10 | Discharge: 2017-01-11 | Disposition: A | Discharge status: Home / Self Care | Payer: Medicare Other | Attending: Emergency Medicine | Admitting: Emergency Medicine

## 2017-01-10 DIAGNOSIS — I5032 Chronic diastolic (congestive) heart failure: Secondary | ICD-10-CM | POA: Insufficient documentation

## 2017-01-10 DIAGNOSIS — Z79899 Other long term (current) drug therapy: Secondary | ICD-10-CM | POA: Insufficient documentation

## 2017-01-10 DIAGNOSIS — E1122 Type 2 diabetes mellitus with diabetic chronic kidney disease: Secondary | ICD-10-CM | POA: Insufficient documentation

## 2017-01-10 DIAGNOSIS — M79604 Pain in right leg: Secondary | ICD-10-CM | POA: Diagnosis present

## 2017-01-10 DIAGNOSIS — L03115 Cellulitis of right lower limb: Secondary | ICD-10-CM | POA: Diagnosis not present

## 2017-01-10 DIAGNOSIS — M79642 Pain in left hand: Secondary | ICD-10-CM | POA: Insufficient documentation

## 2017-01-10 DIAGNOSIS — I2510 Atherosclerotic heart disease of native coronary artery without angina pectoris: Secondary | ICD-10-CM | POA: Insufficient documentation

## 2017-01-10 DIAGNOSIS — R030 Elevated blood-pressure reading, without diagnosis of hypertension: Secondary | ICD-10-CM | POA: Diagnosis not present

## 2017-01-10 DIAGNOSIS — L03119 Cellulitis of unspecified part of limb: Secondary | ICD-10-CM | POA: Insufficient documentation

## 2017-01-10 DIAGNOSIS — Z87891 Personal history of nicotine dependence: Secondary | ICD-10-CM | POA: Insufficient documentation

## 2017-01-10 DIAGNOSIS — N183 Chronic kidney disease, stage 3 (moderate): Secondary | ICD-10-CM | POA: Diagnosis not present

## 2017-01-10 DIAGNOSIS — Z7982 Long term (current) use of aspirin: Secondary | ICD-10-CM | POA: Diagnosis not present

## 2017-01-10 DIAGNOSIS — I130 Hypertensive heart and chronic kidney disease with heart failure and stage 1 through stage 4 chronic kidney disease, or unspecified chronic kidney disease: Secondary | ICD-10-CM | POA: Diagnosis not present

## 2017-01-10 DIAGNOSIS — L03116 Cellulitis of left lower limb: Secondary | ICD-10-CM | POA: Diagnosis not present

## 2017-01-10 NOTE — ED Triage Notes (Signed)
To triage via EMS.  Pt returning today for no improvement in right foot stinging and itching and left hand rash x 6 weeks. Pt thought he had been at ED 4 days ago.  Pt here at ED yesterday was given antibiotics and gout med.  Pt was unable to fill meds d/t no funds.

## 2017-01-10 NOTE — ED Notes (Signed)
ED Provider at bedside. 

## 2017-01-10 NOTE — ED Notes (Signed)
2 blankets placed under pt feet per pt request

## 2017-01-11 ENCOUNTER — Encounter (HOSPITAL_COMMUNITY): Payer: Self-pay

## 2017-01-11 DIAGNOSIS — R2689 Other abnormalities of gait and mobility: Secondary | ICD-10-CM | POA: Diagnosis not present

## 2017-01-11 DIAGNOSIS — T83091A Other mechanical complication of indwelling urethral catheter, initial encounter: Secondary | ICD-10-CM | POA: Diagnosis not present

## 2017-01-11 DIAGNOSIS — I5040 Unspecified combined systolic (congestive) and diastolic (congestive) heart failure: Secondary | ICD-10-CM | POA: Diagnosis not present

## 2017-01-11 LAB — BASIC METABOLIC PANEL
Anion gap: 5 (ref 5–15)
BUN: 31 mg/dL — AB (ref 6–20)
CO2: 24 mmol/L (ref 22–32)
CREATININE: 0.98 mg/dL (ref 0.61–1.24)
Calcium: 9.5 mg/dL (ref 8.9–10.3)
Chloride: 107 mmol/L (ref 101–111)
GFR calc Af Amer: >60 mL/min (ref >60)
GFR calc non Af Amer: >60 mL/min (ref >60)
GLUCOSE: 98 mg/dL (ref 65–99)
POTASSIUM: 4.8 mmol/L (ref 3.5–5.1)
Sodium: 136 mmol/L (ref 135–145)

## 2017-01-11 LAB — CBC WITH DIFFERENTIAL/PLATELET
BASOS PCT: 5 %
Basophils Absolute: 0.5 10*3/uL — ABNORMAL HIGH (ref 0.0–0.1)
EOS PCT: 2 %
Eosinophils Absolute: 0.2 10*3/uL (ref 0.0–0.7)
HEMATOCRIT: 33.4 % — AB (ref 39.0–52.0)
HEMOGLOBIN: 10.3 g/dL — AB (ref 13.0–17.0)
LYMPHS ABS: 2 10*3/uL (ref 0.7–4.0)
Lymphocytes Relative: 20 %
MCH: 26.6 pg (ref 26.0–34.0)
MCHC: 30.8 g/dL (ref 30.0–36.0)
MCV: 86.3 fL (ref 78.0–100.0)
MONO ABS: 2.1 10*3/uL — AB (ref 0.1–1.0)
MONOS PCT: 21 %
NEUTROS ABS: 5.2 10*3/uL (ref 1.7–7.7)
Neutrophils Relative %: 52 %
Platelets: 432 10*3/uL — ABNORMAL HIGH (ref 150–400)
RBC: 3.87 MIL/uL — AB (ref 4.22–5.81)
RDW: 20.4 % — AB (ref 11.5–15.5)
WBC: 10 10*3/uL (ref 4.0–10.5)

## 2017-01-11 LAB — PATHOLOGIST SMEAR REVIEW

## 2017-01-11 LAB — I-STAT CG4 LACTIC ACID, ED: Lactic Acid, Venous: 1.59 mmol/L (ref 0.5–1.9)

## 2017-01-11 MED ORDER — DOXYCYCLINE HYCLATE 100 MG PO TABS
100.0000 mg | ORAL_TABLET | Freq: Once | ORAL | Status: AC
  Administered 2017-01-11: 100 mg via ORAL
  Filled 2017-01-11: qty 1

## 2017-01-11 MED ORDER — AQUAPHOR EX OINT
TOPICAL_OINTMENT | CUTANEOUS | Status: DC | PRN
  Administered 2017-01-11: 1 via TOPICAL
  Filled 2017-01-11: qty 50

## 2017-01-11 MED ORDER — HYDROXYZINE HCL 10 MG PO TABS: 10.0000 mg | ORAL_TABLET | ORAL | 0 refills | Status: DC | PRN

## 2017-01-11 MED ORDER — KETOROLAC TROMETHAMINE 15 MG/ML IJ SOLN
15.0000 mg | Freq: Once | INTRAMUSCULAR | Status: AC
  Administered 2017-01-11: 15 mg via INTRAVENOUS
  Filled 2017-01-11: qty 1

## 2017-01-11 MED ORDER — HYDROXYZINE HCL 10 MG PO TABS
10.0000 mg | ORAL_TABLET | Freq: Once | ORAL | Status: AC
  Administered 2017-01-11: 10 mg via ORAL
  Filled 2017-01-11: qty 1

## 2017-01-11 MED ORDER — DOXYCYCLINE HYCLATE 100 MG PO CAPS: 100.0000 mg | ORAL_CAPSULE | Freq: Two times a day (BID) | ORAL | 0 refills | Status: DC

## 2017-01-11 NOTE — Discharge Planning (Signed)
Pt currently active with [facility] for RN/PT/NA services.  Resumption of care requested. [facility] of [facility] notified.  No DME needs identified at this time.  EDCM mentioned to Adacia about pt not getting home medications.  Colman Cater will enroll pt in pharmacy delivery program upon discharge from ED today. No further EDCM needs identified at this time.  Dickie Labarre J. Clydene Laming, [place], [place], [place]

## 2017-01-11 NOTE — Discharge Instructions (Signed)
You have an infection of her right leg. If you develop fevers worsening redness or pain after initiating antibiotics, you should be reevaluated.

## 2017-01-11 NOTE — ED Notes (Signed)
Per Dr. Dina Rich, pt given water.

## 2017-01-11 NOTE — ED Notes (Signed)
Per Dr. Dina Rich, pt to hold in the ED until social work can be consulted in the AM. Pt reports he cannot afford his medications and is unable to take the prescribed abx due to lack of funds. Charge RN aware of plan.

## 2017-01-11 NOTE — ED Notes (Signed)
Ordered breakfast tray  

## 2017-01-11 NOTE — ED Notes (Signed)
Redness on pt foot marked and dated with skin marker.

## 2017-01-11 NOTE — ED Notes (Signed)
ED Provider at bedside. 

## 2017-01-11 NOTE — ED Provider Notes (Signed)
[facility] DEPT Provider Note   CSN: [id number] Arrival date & time: 01/10/17  2219     History   Chief Complaint Chief Complaint  Patient presents with  . Foot Pain  . Hand Problem    HPI Juan [name] is a 76 y.o. male.  HPI  This is a 76 year old male with a history of coronary artery disease, CHF, edema, venous stasis who presents with worsening right leg pain. Patient was seen and evaluated yesterday for the same. At that time he was treated for gout and possible mild cellulitis. He had a leukocytosis of 11. Patient has been unable to obtain his antibiotic or pain medication as an outpatient. He does report taking "a leftover antibiotic' at home. He reports persistent pain and itching of the right foot. Current pain is 10 out of 10. He also reports chronic pain in the left hand. He reports chills without fevers. He denies any chest pain, shortness breath, abdominal pain, nausea, vomiting.  Past Medical History:  Diagnosis Date  . Allergic rhinitis   . Anemia   . Ascending aortic aneurysm ([place]) 03/2014   4.3cm on CT scan  . CAD (coronary artery disease)    dx elsewheer in past, no documentation. Non-ischemic myoview 2007  . Chronic diastolic CHF (congestive heart failure), NYHA class 2 (HCC)    Normal EF w/ grade 1 dd by echo 12/2015  . Edema    R>L leg, u/s 5-12 neg for DVT  . Hemorrhoid   . History of thrombocytosis   . Hypertension   . Migraine    "once/wk at least" (07/11/2013)  . Myeloproliferative disease ([place])   . Shortness of breath   . Sinus congestion   . Sleep apnea, obstructive    at some point used CPAP, was d/c  years ago  . Type II diabetes mellitus [facility])     Patient Active Problem List   Diagnosis Date Noted  . Right hip pain 12/24/2016  . 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 Muscle spasm 11/16/2016  . Strep throat 11/16/2016  . Neck pain 11/14/2016  . Elevated troponin 11/14/2016  . Arthritis   . Recurrent pneumonia  10/31/2016  . Acute extremity pain 10/31/2016  . HCAP (healthcare-associated pneumonia) 10/31/2016  . 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 Pulmonary hypertension ([place])   . Sprain of left rotator cuff capsule   . Hyperkalemia   . Acute kidney injury superimposed on CKD ([place])   . Fever 10/08/2016  . Left arm pain 10/08/2016  . Paroxysmal SVT (supraventricular tachycardia) ([place]) 04/20/2016  . Type II diabetes mellitus ([place]) 04/20/2016  . Gout 04/20/2016  . Hyperuricemia 04/20/2016  . B12 deficiency 04/20/2016  . 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 Hydronephrosis   . Thrombocytosis ([place]) 04/07/2016  . Anemia 04/07/2016  . Community acquired pneumonia 04/01/2016  . Pneumonia 04/01/2016  . Pressure injury of skin 04/01/2016  . Pressure ulcer stage II 12/31/2015  . UTI (urinary tract infection) 12/30/2015  . Sepsis ([place]) 12/29/2015  . Acute lower UTI 12/29/2015  . T wave inversion in EKG 12/29/2015  . PCP NOTES >>>>>>>>>>>>>>>>> 05/02/2015  . Arm pain   . Peripheral edema   . Unable to ambulate   . Thoracic aortic aneurysm ([place]) 04/26/2014  . CKD (chronic kidney disease) stage 3, GFR 30-59 ml/min 04/26/2014  . 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 Chest pain 04/15/2014  . Encounter for palliative care 04/06/2014  . Generalized weakness 04/06/2014  .  Venous stasis dermatitis of both lower extremities   . Malnutrition of moderate degree ([place]) 03/31/2014  . Morbid obesity due to excess calories ([place]) 03/29/2014  . 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 Poor compliance with advise  05/05/2012  . Annual physical exam 01/14/2012  . Weight loss 01/14/2012  . *Chronic lower extremity edema, stasis dermatitis 06/09/2011  . CARDIOMEGALY 04/29/2009  . *Prediabetes 09/07/2006  . Symptomatic anemia 09/07/2006  . Myeloproliferative disease ([place]) 09/07/2006  . Obstructive sleep apnea 09/07/2006  . Essential hypertension 09/07/2006  . Coronary  atherosclerosis 09/07/2006  . Chronic diastolic CHF (congestive heart failure) ([place]) 09/07/2006  . Allergic rhinitis 09/07/2006    Past Surgical History:  Procedure Laterality Date  . TOE SURGERY Right    "tried to straighten out big toe" (07/11/2013)       Home Medications    Prior to Admission medications   Medication Sig Start Date End Date Taking? Authorizing Provider  acetaminophen (TYLENOL) 500 MG tablet Take 500 mg by mouth every 6 (six) hours as needed for mild pain or moderate pain.   Yes [provider]  aspirin 325 MG tablet Take 325 mg by mouth daily.   Yes [provider]  B Complex Vitamins (VITAMIN B COMPLEX PO) Take 100 mg by mouth daily.   Yes [provider]  bisacodyl (DULCOLAX) 10 MG suppository Place 1 suppository (10 mg total) rectally daily as needed for moderate constipation. 04/18/16  Yes Hongalgi, Lenis Dickinson, MD  diclofenac sodium (VOLTAREN) 1 % GEL Apply 4 g topically 2 (two) times daily as needed (pain). Apply to bilateral  knees    Yes [provider]  feeding supplement, GLUCERNA SHAKE, (GLUCERNA SHAKE) LIQD Take 118.5 mLs by mouth daily. Reported on 08/21/2015   Yes [provider]  ferrous gluconate (FERGON) 240 (27 FE) MG tablet Take 240 mg by mouth daily.   Yes [provider]  fluticasone (FLONASE) 50 MCG/ACT nasal spray Place 2 sprays into both nostrils daily. Reported on 05/02/2015   Yes [provider]  furosemide (LASIX) 40 MG tablet Take 40 mg by mouth daily.   Yes [provider]  gabapentin (NEURONTIN) 100 MG capsule Take 2 capsules (200 mg total) by mouth 3 (three) times daily as needed (pain). Reported on 08/22/2015 10/17/16  Yes Rama, Venetia Maxon, MD  hydrocerin (EUCERIN) CREA Apply Eucerin cream to BLE Q day after bathing and roughly towel drying to remove loose skin Patient taking differently: Apply 1 application topically daily as needed (dryness).  01/27/14  Yes Ghimire, Henreitta Leber, MD  nitroGLYCERIN (NITROSTAT) 0.4 MG SL tablet Place 1 tablet (0.4 mg total) under the tongue every 5 (five) minutes as needed for chest pain. 01/06/17 04/06/17 Yes TillerySatira Mccallum, PA-C  potassium chloride SA (K-DUR,KLOR-CON) 20 MEQ tablet Take 20 mEq by mouth as needed (leg cramps).   Yes [provider]  acetaminophen (TYLENOL) 325 MG tablet Take 2 tablets (650 mg total) by mouth every 6 (six) hours as needed for mild pain, fever or headache. Patient not taking: Reported on 01/09/2017 11/16/16   Janece Canterbury, MD  doxycycline (VIBRAMYCIN) 100 MG capsule Take 1 capsule (100 mg total) by mouth 2 (two) times daily. 01/09/17   Glyn Ade, PA-C  indomethacin (INDOCIN) 50 MG capsule Take 1 capsule (50 mg total) by mouth 3 (three) times daily with meals. 01/09/17   Glyn Ade, PA-C  methocarbamol (ROBAXIN) 750 MG tablet Take 1 tablet (750 mg total) by  mouth 3 (three) times daily. Patient not taking: Reported on 01/09/2017 10/17/16   Rama, Venetia Maxon, MD  OXYGEN Inhale 2 L into the lungs continuous.    [provider]    Family History Family History  Problem Relation Age of Onset  . Schizophrenia Son   . Colon cancer Neg Hx   . Prostate cancer Neg Hx   . Heart attack Neg Hx   . Diabetes Neg Hx     Social History Social History  Substance Use Topics  . Smoking status: Former Smoker    Packs/day: 0.25    Years: 12.00    Types: Cigarettes    Quit date: 05/18/1966  . Smokeless tobacco: Never Used     Comment: quit smoking 45 years ago  . Alcohol use No     Allergies   Patient has no known allergies.   Review of Systems Review of Systems  Constitutional: Positive for chills. Negative for fever.  Respiratory: Negative for shortness of breath.   Cardiovascular: Negative for chest pain.  Skin: Positive for color change. Negative for rash and wound.  All other systems reviewed and are negative.    Physical Exam Updated Vital Signs BP (!)  155/87   Pulse 87   Temp 98.4 F (36.9 C) (Oral)   Resp 16   Ht 6\' 3"  (1.905 m)   Wt 83.5 kg (184 lb)   SpO2 98%   BMI 23.00 kg/m   Physical Exam  Constitutional: He is oriented to person, place, and time.  Elderly, chronically ill-appearing, no acute distress  HENT:  Head: Normocephalic and atraumatic.  Cardiovascular: Normal rate, regular rhythm and normal heart sounds.   No murmur heard. Pulmonary/Chest: Effort normal. No respiratory distress. He has no wheezes.  Nasal cannula in place, coarse breath sounds bilaterally  Abdominal: Soft. Bowel sounds are normal. There is no tenderness. There is no rebound.  Musculoskeletal: He exhibits no edema.  1+ bilateral lower extremity edema  Neurological: He is alert and oriented to person, place, and time.  Skin: Skin is warm and dry. There is erythema.  Dry flaky skin bilateral lower extremities, there is redness and erythema over the first MTP joint, erythema extends over the dorsum of foot and just above the ankle, normal range of motion the ankle, 2+ DP pulse  Psychiatric: He has a normal mood and affect.  Nursing note and vitals reviewed.    ED Treatments / Results  Labs (all labs ordered are listed, but only abnormal results are displayed) Labs Reviewed  CBC WITH DIFFERENTIAL/PLATELET - Abnormal; Notable for the following:       Result Value   RBC 3.87 (*)    Hemoglobin 10.3 (*)    HCT 33.4 (*)    RDW 20.4 (*)    Platelets 432 (*)    Monocytes Absolute 2.1 (*)    Basophils Absolute 0.5 (*)    All other components within normal limits  BASIC METABOLIC PANEL - Abnormal; Notable for the following:    BUN 31 (*)    All other components within normal limits    EKG  EKG Interpretation None       Radiology Dg Foot Complete Right  Result Date: 01/09/2017 CLINICAL DATA:  Foot pain and swelling. EXAM: RIGHT FOOT COMPLETE - 3+ VIEW COMPARISON:  None. FINDINGS: No acute fracture or malalignment. Hallux valgus with bony  bunion. Osteopenic appearance with trabecular coarsening. Patient has history of thrombocytosis. IMPRESSION: 1. No acute finding. 2. Prominent hallux  valgus. Electronically Signed   By: Monte Fantasia M.D.   On: 01/09/2017 14:17    Procedures Procedures (including critical care time)  Medications Ordered in ED Medications  mineral oil-hydrophilic petrolatum (AQUAPHOR) ointment (not administered)  ketorolac (TORADOL) 15 MG/ML injection 15 mg (15 mg Intravenous Given 01/11/17 0145)  doxycycline (VIBRA-TABS) tablet 100 mg (100 mg Oral Given 01/11/17 0144)     Initial Impression / Assessment and Plan / ED Course  I have reviewed the triage vital signs and the nursing notes.  Pertinent labs & imaging results that were available during my care of the patient were reviewed by me and considered in my medical decision making (see chart for details).     Patient presents with worsening pain and itching of the right foot. He is nontoxic. Afebrile. Vital signs reassuring. He does appear to have some cellulitis of the right foot likely related to chronic dryness and venous stasis. He is unsure what kind of antibiotic he has taken at home but it was not the antibiotic prescribed yesterday as he is having difficulty affording this. He may also be having an acute gouty flare given the tenderness over the MTP joint. Yesterday he had a leukocytosis to 11. This is improving today.Otherwise lab work is reassuring. He was given pain medication and one dose of doxycycline. I do not feel that he is technically failed outpatient management given that it is unclear what antibiotic he chose to take yesterday. However, I do feel it is very important that he get his biotics to take at home. He reports that his son will likely not be able to afford antibiotics for greater than one week. We'll hold for case management help in the morning. At this time he does not appear systemically ill or septic.  After history, exam, and  medical workup I feel the patient has been appropriately medically screened and is safe for discharge home. Pertinent diagnoses were discussed with the patient. Patient was given return precautions.   Final Clinical Impressions(s) / ED Diagnoses   Final diagnoses:  Cellulitis of foot    New Prescriptions New Prescriptions   No medications on file     Merryl Hacker, MD 01/11/17 [phone]

## 2017-01-11 NOTE — ED Notes (Signed)
Care management at bedside.

## 2017-01-11 NOTE — ED Notes (Addendum)
Unsuccessful IV attempt and blood draw x 2. Autumn, RN at the bedside to attempt IV.

## 2017-01-11 NOTE — ED Notes (Signed)
Pt sleeping comfortably, rise and fall of chest noted. Pt IV secured with more tape by this RN.

## 2017-01-11 NOTE — ED Notes (Signed)
Pt turned and repositioned, pt foley repositioned for more comfort for pt.

## 2017-01-11 NOTE — ED Notes (Signed)
Juan [name] with case management has arranged for home health to bring him his medications today. Pt leaving with [facility] with all belongings.

## 2017-01-11 NOTE — ED Notes (Signed)
Pt given turkey sandwich

## 2017-01-13 ENCOUNTER — Emergency Department (HOSPITAL_COMMUNITY)
Admission: EM | Admit: 2017-01-13 | Discharge: 2017-01-13 | Disposition: A | Discharge status: Home / Self Care | Payer: Medicare Other | Attending: Emergency Medicine | Admitting: Emergency Medicine

## 2017-01-13 ENCOUNTER — Encounter (HOSPITAL_COMMUNITY): Payer: Self-pay | Admitting: Emergency Medicine

## 2017-01-13 ENCOUNTER — Encounter: Payer: Self-pay | Admitting: Hematology & Oncology

## 2017-01-13 DIAGNOSIS — E119 Type 2 diabetes mellitus without complications: Secondary | ICD-10-CM | POA: Insufficient documentation

## 2017-01-13 DIAGNOSIS — R2232 Localized swelling, mass and lump, left upper limb: Secondary | ICD-10-CM | POA: Diagnosis not present

## 2017-01-13 DIAGNOSIS — I509 Heart failure, unspecified: Secondary | ICD-10-CM | POA: Insufficient documentation

## 2017-01-13 DIAGNOSIS — Z87891 Personal history of nicotine dependence: Secondary | ICD-10-CM | POA: Insufficient documentation

## 2017-01-13 DIAGNOSIS — M109 Gout, unspecified: Secondary | ICD-10-CM | POA: Diagnosis not present

## 2017-01-13 DIAGNOSIS — L03115 Cellulitis of right lower limb: Secondary | ICD-10-CM | POA: Insufficient documentation

## 2017-01-13 DIAGNOSIS — I130 Hypertensive heart and chronic kidney disease with heart failure and stage 1 through stage 4 chronic kidney disease, or unspecified chronic kidney disease: Secondary | ICD-10-CM | POA: Insufficient documentation

## 2017-01-13 DIAGNOSIS — N183 Chronic kidney disease, stage 3 (moderate): Secondary | ICD-10-CM | POA: Diagnosis not present

## 2017-01-13 DIAGNOSIS — R52 Pain, unspecified: Secondary | ICD-10-CM | POA: Diagnosis not present

## 2017-01-13 DIAGNOSIS — R2689 Other abnormalities of gait and mobility: Secondary | ICD-10-CM | POA: Diagnosis not present

## 2017-01-13 DIAGNOSIS — R2241 Localized swelling, mass and lump, right lower limb: Secondary | ICD-10-CM | POA: Diagnosis present

## 2017-01-13 DIAGNOSIS — Z7982 Long term (current) use of aspirin: Secondary | ICD-10-CM | POA: Insufficient documentation

## 2017-01-13 DIAGNOSIS — I2510 Atherosclerotic heart disease of native coronary artery without angina pectoris: Secondary | ICD-10-CM | POA: Diagnosis not present

## 2017-01-13 DIAGNOSIS — Z79899 Other long term (current) drug therapy: Secondary | ICD-10-CM | POA: Diagnosis not present

## 2017-01-13 DIAGNOSIS — M1A0710 Idiopathic chronic gout, right ankle and foot, without tophus (tophi): Secondary | ICD-10-CM | POA: Diagnosis not present

## 2017-01-13 DIAGNOSIS — L0390 Cellulitis, unspecified: Secondary | ICD-10-CM | POA: Diagnosis not present

## 2017-01-13 DIAGNOSIS — D649 Anemia, unspecified: Secondary | ICD-10-CM | POA: Insufficient documentation

## 2017-01-13 LAB — COMPREHENSIVE METABOLIC PANEL
ALT: 11 U/L — AB (ref 17–63)
ANION GAP: 9 (ref 5–15)
AST: 25 U/L (ref 15–41)
Albumin: 2.8 g/dL — ABNORMAL LOW (ref 3.5–5.0)
Alkaline Phosphatase: 117 U/L (ref 38–126)
BUN: 34 mg/dL — ABNORMAL HIGH (ref 6–20)
CHLORIDE: 108 mmol/L (ref 101–111)
CO2: 21 mmol/L — AB (ref 22–32)
Calcium: 9.6 mg/dL (ref 8.9–10.3)
Creatinine, Ser: 1.02 mg/dL (ref 0.61–1.24)
GFR calc non Af Amer: >60 mL/min (ref >60)
Glucose, Bld: 102 mg/dL — ABNORMAL HIGH (ref 65–99)
POTASSIUM: 4.1 mmol/L (ref 3.5–5.1)
SODIUM: 138 mmol/L (ref 135–145)
Total Bilirubin: 0.5 mg/dL (ref 0.3–1.2)
Total Protein: 5.6 g/dL — ABNORMAL LOW (ref 6.5–8.1)

## 2017-01-13 LAB — CBC WITH DIFFERENTIAL/PLATELET
BASOS ABS: 0.4 10*3/uL — AB (ref 0.0–0.1)
Basophils Relative: 3 %
EOS ABS: 0.4 10*3/uL (ref 0.0–0.7)
Eosinophils Relative: 3 %
HCT: 34 % — ABNORMAL LOW (ref 39.0–52.0)
Hemoglobin: 10.4 g/dL — ABNORMAL LOW (ref 13.0–17.0)
Lymphocytes Relative: 22 %
Lymphs Abs: 2.6 10*3/uL (ref 0.7–4.0)
MCH: 26.4 pg (ref 26.0–34.0)
MCHC: 30.6 g/dL (ref 30.0–36.0)
MCV: 86.3 fL (ref 78.0–100.0)
Monocytes Absolute: 1.8 10*3/uL — ABNORMAL HIGH (ref 0.1–1.0)
Monocytes Relative: 15 %
NEUTROS ABS: 6.5 10*3/uL (ref 1.7–7.7)
Neutrophils Relative %: 57 %
PLATELETS: 422 10*3/uL — AB (ref 150–400)
RBC: 3.94 MIL/uL — ABNORMAL LOW (ref 4.22–5.81)
RDW: 19.3 % — ABNORMAL HIGH (ref 11.5–15.5)
WBC: 11.7 10*3/uL — ABNORMAL HIGH (ref 4.0–10.5)

## 2017-01-13 MED ORDER — KETOROLAC TROMETHAMINE 30 MG/ML IJ SOLN
30.0000 mg | Freq: Once | INTRAMUSCULAR | Status: AC
  Administered 2017-01-13: 30 mg via INTRAMUSCULAR
  Filled 2017-01-13: qty 1

## 2017-01-13 MED ORDER — GABAPENTIN 400 MG PO CAPS
400.0000 mg | ORAL_CAPSULE | Freq: Once | ORAL | Status: AC
  Administered 2017-01-13: 400 mg via ORAL
  Filled 2017-01-13: qty 1

## 2017-01-13 MED ORDER — HYDROCODONE-ACETAMINOPHEN 5-325 MG PO TABS
1.0000 | ORAL_TABLET | Freq: Once | ORAL | Status: AC
  Administered 2017-01-13: 1 via ORAL
  Filled 2017-01-13: qty 1

## 2017-01-13 MED ORDER — DOXYCYCLINE HYCLATE 100 MG PO TABS
100.0000 mg | ORAL_TABLET | Freq: Once | ORAL | Status: AC
  Administered 2017-01-13: 100 mg via ORAL
  Filled 2017-01-13: qty 1

## 2017-01-13 NOTE — Discharge Instructions (Signed)
You should have home delivery of your medications over the next day or so. Your home health agency will assist you with your medications Return for worsening symptoms.

## 2017-01-13 NOTE — Discharge Planning (Signed)
This EDCM spoke with this pt at length on his last admission.  This pt was advised that his [facility] agency will visit him and bring his meds for first visit and they would sign him up with a pharmacy that delivers.  Pt has insurance coverage and is not eligible for medication assistance at this time.

## 2017-01-13 NOTE — ED Provider Notes (Signed)
[facility] DEPT Provider Note   CSN: [id number] Arrival date & time: 01/13/17  0122     History   Chief Complaint Chief Complaint  Patient presents with  . Gen. Body Aches    Ran Out Of Gabapentin     HPI Juan [name] is a 76 y.o. male.  HPI  This is a 76 year old male who presents with pain. This is the patient's third visit in the last 5 days.  He reports persistent pain and swelling of the right foot as well as pain in the left hand. I evaluated the patient 2 days ago. At that time he had not taken his outpatient antibiotics because of issues obtaining them. Case management did see the patient and set him up to have his prescriptions delivered. Patient states that the prescriptions never arrived. He denies any fevers. He continues to endorse pain and swelling. He feels like his foot is more swollen. Current pain is 10 out of 10. He states that he is just been taking "the medications already have at home." I asked the patient if he felt safe at home. He states that he doesn't he wishes to stay at home but "can you admit to the hospital because I'm very sick." He denies any other symptoms including chest pain, shortness of breath, abdominal pain.  Past Medical History:  Diagnosis Date  . Allergic rhinitis   . Anemia   . Ascending aortic aneurysm ([place]) 03/2014   4.3cm on CT scan  . CAD (coronary artery disease)    dx elsewheer in past, no documentation. Non-ischemic myoview 2007  . Chronic diastolic CHF (congestive heart failure), NYHA class 2 (HCC)    Normal EF w/ grade 1 dd by echo 12/2015  . Edema    R>L leg, u/s 5-12 neg for DVT  . Hemorrhoid   . History of thrombocytosis   . Hypertension   . Migraine    "once/wk at least" (07/11/2013)  . Myeloproliferative disease ([place])   . Shortness of breath   . Sinus congestion   . Sleep apnea, obstructive    at some point used CPAP, was d/c  years ago  . Type II diabetes mellitus [facility])     Patient Active Problem List   Diagnosis Date Noted  . Right hip pain 12/24/2016  . 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 Muscle spasm 11/16/2016  . Strep throat 11/16/2016  . Neck pain 11/14/2016  . Elevated troponin 11/14/2016  . Arthritis   . Recurrent pneumonia 10/31/2016  . Acute extremity pain 10/31/2016  . HCAP (healthcare-associated pneumonia) 10/31/2016  . 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 Pulmonary hypertension ([place])   . Sprain of left rotator cuff capsule   . Hyperkalemia   . Acute kidney injury superimposed on CKD ([place])   . Fever 10/08/2016  . Left arm pain 10/08/2016  . Paroxysmal SVT (supraventricular tachycardia) ([place]) 04/20/2016  . Type II diabetes mellitus ([place]) 04/20/2016  . Gout 04/20/2016  . Hyperuricemia 04/20/2016  . B12 deficiency 04/20/2016  . 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 Hydronephrosis   . Thrombocytosis ([place]) 04/07/2016  . Anemia 04/07/2016  . Community acquired pneumonia 04/01/2016  . Pneumonia 04/01/2016  . Pressure injury of skin 04/01/2016  . Pressure ulcer stage II 12/31/2015  . UTI (urinary tract infection) 12/30/2015  . Sepsis ([place]) 12/29/2015  . Acute lower UTI 12/29/2015  . T wave inversion in EKG 12/29/2015  . PCP NOTES >>>>>>>>>>>>>>>>> 05/02/2015  . Arm pain   .  Peripheral edema   . Unable to ambulate   . Thoracic aortic aneurysm ([place]) 04/26/2014  . CKD (chronic kidney disease) stage 3, GFR 30-59 ml/min 04/26/2014  . 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 Chest pain 04/15/2014  . Encounter for palliative care 04/06/2014  . Generalized weakness 04/06/2014  . Venous stasis dermatitis of both lower extremities   . Malnutrition of moderate degree ([place]) 03/31/2014  . Morbid obesity due to excess calories ([place]) 03/29/2014  . 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 Poor compliance with advise  05/05/2012  . Annual physical exam 01/14/2012  . Weight loss 01/14/2012  . *Chronic lower extremity  edema, stasis dermatitis 06/09/2011  . CARDIOMEGALY 04/29/2009  . *Prediabetes 09/07/2006  . Symptomatic anemia 09/07/2006  . Myeloproliferative disease ([place]) 09/07/2006  . Obstructive sleep apnea 09/07/2006  . Essential hypertension 09/07/2006  . Coronary atherosclerosis 09/07/2006  . Chronic diastolic CHF (congestive heart failure) ([place]) 09/07/2006  . Allergic rhinitis 09/07/2006    Past Surgical History:  Procedure Laterality Date  . TOE SURGERY Right    "tried to straighten out big toe" (07/11/2013)       Home Medications    Prior to Admission medications   Medication Sig Start Date End Date Taking? Authorizing Provider  acetaminophen (TYLENOL) 500 MG tablet Take 500 mg by mouth every 6 (six) hours as needed for mild pain or moderate pain.   Yes [provider]  aspirin 325 MG tablet Take 325 mg by mouth daily.   Yes [provider]  B Complex Vitamins (VITAMIN B COMPLEX PO) Take 100 mg by mouth daily.   Yes [provider]  bisacodyl (DULCOLAX) 10 MG suppository Place 1 suppository (10 mg total) rectally daily as needed for moderate constipation. 04/18/16  Yes Hongalgi, Lenis Dickinson, MD  diclofenac sodium (VOLTAREN) 1 % GEL Apply 4 g topically 2 (two) times daily as needed (pain). Apply to bilateral  knees    Yes [provider]  feeding supplement, GLUCERNA SHAKE, (GLUCERNA SHAKE) LIQD Take 118.5 mLs by mouth daily. Reported on 08/21/2015   Yes [provider]  ferrous gluconate (FERGON) 240 (27 FE) MG tablet Take 240 mg by mouth daily.   Yes [provider]  fluticasone (FLONASE) 50 MCG/ACT nasal spray Place 2 sprays into both nostrils daily. Reported on 05/02/2015   Yes [provider]  furosemide (LASIX) 40 MG tablet Take 40 mg by mouth daily.   Yes [provider]  gabapentin (NEURONTIN) 100 MG capsule Take 2 capsules (200 mg total) by mouth 3 (three) times daily as needed (pain). Reported on 08/22/2015 10/17/16   Yes Rama, Venetia Maxon, MD  hydrocerin (EUCERIN) CREA Apply Eucerin cream to BLE Q day after bathing and roughly towel drying to remove loose skin Patient taking differently: Apply 1 application topically daily as needed (dryness).  01/27/14  Yes Ghimire, Henreitta Leber, MD  nitroGLYCERIN (NITROSTAT) 0.4 MG SL tablet Place 1 tablet (0.4 mg total) under the tongue every 5 (five) minutes as needed for chest pain. 01/06/17 04/06/17 Yes TillerySatira Mccallum, PA-C  potassium chloride SA (K-DUR,KLOR-CON) 20 MEQ tablet Take 20 mEq by mouth as needed (leg cramps).   Yes [provider]  acetaminophen (TYLENOL) 325 MG tablet Take 2 tablets (650 mg total) by mouth every 6 (six) hours as needed for mild pain, fever or headache. Patient not taking: Reported on 01/09/2017 11/16/16   Janece Canterbury, MD  doxycycline (VIBRAMYCIN) 100 MG capsule Take 1 capsule (100  mg total) by mouth 2 (two) times daily. 01/09/17   Glyn Ade, PA-C  doxycycline (VIBRAMYCIN) 100 MG capsule Take 1 capsule (100 mg total) by mouth 2 (two) times daily. 01/11/17   Jorgen Wolfinger, Barbette Hair, MD  hydrOXYzine (ATARAX/VISTARIL) 10 MG tablet Take 1 tablet (10 mg total) by mouth every 4 (four) hours as needed for itching. 01/11/17   Gareth Morgan, MD  indomethacin (INDOCIN) 50 MG capsule Take 1 capsule (50 mg total) by mouth 3 (three) times daily with meals. 01/09/17   Glyn Ade, PA-C  methocarbamol (ROBAXIN) 750 MG tablet Take 1 tablet (750 mg total) by mouth 3 (three) times daily. Patient not taking: Reported on 01/09/2017 10/17/16   Rama, Venetia Maxon, MD  OXYGEN Inhale 2 L into the lungs continuous.    [provider]    Family History Family History  Problem Relation Age of Onset  . Schizophrenia Son   . Colon cancer Neg Hx   . Prostate cancer Neg Hx   . Heart attack Neg Hx   . Diabetes Neg Hx     Social History Social History  Substance Use Topics  . Smoking status: Former Smoker    Packs/day: 0.25    Years:  12.00    Types: Cigarettes    Quit date: 05/18/1966  . Smokeless tobacco: Never Used     Comment: quit smoking 45 years ago  . Alcohol use No     Allergies   Patient has no known allergies.   Review of Systems Review of Systems  Constitutional: Negative for fever.  Respiratory: Negative for shortness of breath.   Cardiovascular: Negative for chest pain.  Gastrointestinal: Negative for abdominal pain, nausea and vomiting.  Genitourinary: Negative for dysuria.  Musculoskeletal:       Foot pain and itching Left index finger swelling  All other systems reviewed and are negative.    Physical Exam Updated Vital Signs BP (!) 158/83 (BP Location: Left Arm)   Pulse 90   Temp 98.3 F (36.8 C) (Oral)   Resp 16   SpO2 99%   Physical Exam  Constitutional: He is oriented to person, place, and time.  Chronically ill-appearing, no acute distress  HENT:  Head: Normocephalic and atraumatic.  Cardiovascular: Normal rate, regular rhythm and normal heart sounds.   No murmur heard. Pulmonary/Chest: Effort normal and breath sounds normal. No respiratory distress. He has no wheezes.  Abdominal: Soft. Bowel sounds are normal. There is no tenderness. There is no rebound.  Genitourinary:  Genitourinary Comments: Foley catheter in place  Musculoskeletal: He exhibits edema.  Bilateral lower extremity edema right slightly greater than left Swelling noted of the left index finger mostly over the PIP joint  Lymphadenopathy:    He has no cervical adenopathy.  Neurological: He is alert and oriented to person, place, and time.  Skin: Skin is warm and dry.  Mild warmth noted over the right great toe, improved erythema within compared to exam 2 days ago, decreased erythema over the ankle, continued finish stasis changes and generalized dryness  Psychiatric: He has a normal mood and affect.  Nursing note and vitals reviewed.    ED Treatments / Results  Labs (all labs ordered are listed, but  only abnormal results are displayed) Labs Reviewed  CBC WITH DIFFERENTIAL/PLATELET - Abnormal; Notable for the following:       Result Value   WBC 11.7 (*)    RBC 3.94 (*)    Hemoglobin 10.4 (*)  HCT 34.0 (*)    RDW 19.3 (*)    Platelets 422 (*)    Monocytes Absolute 1.8 (*)    Basophils Absolute 0.4 (*)    All other components within normal limits  COMPREHENSIVE METABOLIC PANEL - Abnormal; Notable for the following:    CO2 21 (*)    Glucose, Bld 102 (*)    BUN 34 (*)    Total Protein 5.6 (*)    Albumin 2.8 (*)    ALT 11 (*)    All other components within normal limits    EKG  EKG Interpretation None       Radiology No results found.  Procedures Procedures (including critical care time)  Medications Ordered in ED Medications  ketorolac (TORADOL) 30 MG/ML injection 30 mg (not administered)  gabapentin (NEURONTIN) capsule 400 mg (400 mg Oral Given 01/13/17 0527)  HYDROcodone-acetaminophen (NORCO/VICODIN) 5-325 MG per tablet 1 tablet (1 tablet Oral Given 01/13/17 0527)  doxycycline (VIBRA-TABS) tablet 100 mg (100 mg Oral Given 01/13/17 0527)     Initial Impression / Assessment and Plan / ED Course  I have reviewed the triage vital signs and the nursing notes.  Pertinent labs & imaging results that were available during my care of the patient were reviewed by me and considered in my medical decision making (see chart for details).     Patient presents with persistent pain and complaints similar to presentation 2 days ago. He continues to be without his medication at home. He reports that this was not delivered to him. He is overall nontoxic-appearing. Afebrile. Cellulitis appears improved when compared to exam 2 days ago.  Baseline leukocytosis of 11. Patient was given an additional dose of doxycycline, Norco, Toradol for likely gouty flare.  Again will consult case management to ensure patient can receive antibiotics as an outpatient. He has no indication for admission  at this time.  Final Clinical Impressions(s) / ED Diagnoses   Final diagnoses:  Chronic gout of right foot, unspecified cause  Cellulitis of right lower extremity    New Prescriptions New Prescriptions   No medications on file     Merryl Hacker, MD 01/13/17 [phone]

## 2017-01-13 NOTE — ED Triage Notes (Signed)
Patient arrived with EMS from home reports generalized weakness , body aches/muscle stiffness onset this week .

## 2017-01-13 NOTE — ED Provider Notes (Signed)
..   Please see previous physicians note regarding patient's presenting history and physical, initial ED course, and associated medical decision making.  Patient is awaiting case management consult at time of sign out. Was seen by the case manager regarding access to medications. He does have insurance and does not qualify for medication assistance. However he is set up with home health agency. They will bring with them on his medications tomorrow. They will also register with pharmacy and help him receive home health delivery of his medications from here on out. Patient is felt comfortable for discharge home. Vitals stable. No other complaints. Strict return and follow-up instructions reviewed. He expressed understanding of all discharge instructions and felt comfortable with the plan of care.    Forde Dandy, MD 01/13/17 [phone]

## 2017-01-13 NOTE — Discharge Planning (Signed)
EDCM spoke with Adacia from [facility] who states pt is enrolled in home deliver of medications.  He should receive them within a day or two.  Pt should have enough medication to last until first delivery.

## 2017-01-13 NOTE — ED Notes (Signed)
Spoke with case manager states patient able to go home. Already scheduled to have home medications delivered today.

## 2017-01-13 NOTE — ED Notes (Signed)
Pt finished eating breakfast tray. Pulled up in bed and resting at this time.

## 2017-01-13 NOTE — ED Notes (Signed)
Patient with multiple complaints , states his hands and legs are swelling he thinks he has the Gout. Patient has a foley in states he is suppose to follow up with his MD 9/17. States he has been in the ED almost everyday.

## 2017-01-15 ENCOUNTER — Other Ambulatory Visit: Payer: Self-pay

## 2017-01-15 NOTE — Patient Outreach (Signed)
[facility]) Care Management  01/15/2017  Juan [name] 1940/10/29 [id number]   Telephone call to patient for ED utilization.  Patient able to verify HIPAA.  Discussed with patient his current needs.  He reports some issues in getting medications.  Discussed with patient [place] and how we could be of assistance with not only obtaining medications but also helping with his health issues.  Patient reports that he wishes not to deal with [facility] at this time as he reports he has had some dealings in the past with [facility] and he felt that he was passed around to too many people and in the end he was not able to get any assistance. Further discussed with patient exactly how [facility] process works and how I could make a referral directly to our pharmacist for assistance.  He still declined.  I went further on to discuss nursing needs and how we could keep him out of the emergency room with our nursing services helping him on education and working with his physician.  He continued to decline.  Offered to send letter and brochure for future reference.  Patient is agreeable.    Plan: RN Health Coach will send letter and brochure for future reference.   RN Health Coach will send closure letter to physician notifying him of patient refusal of services. RN Health Coach will notify care management assistant of case status.    Jone Baseman, RN, MSN [place] [phone]

## 2017-01-19 ENCOUNTER — Telehealth (HOSPITAL_COMMUNITY): Payer: Self-pay | Admitting: Surgery

## 2017-01-19 NOTE — Telephone Encounter (Signed)
I attempted to call Juan [name] to speak with him about continuation of [facility] home visits.  [facility] reports that Juan [name] has told her he is no longer interested in visits and wishes to discontinue the program.  I will attempt again to speak with him to confirm.  His voicemail is not set-up therefore I am unable to leave a message at this time.

## 2017-01-23 ENCOUNTER — Inpatient Hospital Stay (HOSPITAL_COMMUNITY)
Admission: EM | Admit: 2017-01-23 | Discharge: 2017-01-26 | DRG: 699 | Disposition: A | Discharge status: Home / Self Care | Payer: Medicare Other | Attending: Internal Medicine | Admitting: Internal Medicine

## 2017-01-23 ENCOUNTER — Emergency Department (HOSPITAL_COMMUNITY): Payer: Medicare Other

## 2017-01-23 ENCOUNTER — Encounter (HOSPITAL_COMMUNITY): Payer: Self-pay | Admitting: Emergency Medicine

## 2017-01-23 DIAGNOSIS — Z9981 Dependence on supplemental oxygen: Secondary | ICD-10-CM

## 2017-01-23 DIAGNOSIS — R339 Retention of urine, unspecified: Secondary | ICD-10-CM | POA: Diagnosis present

## 2017-01-23 DIAGNOSIS — Z599 Problem related to housing and economic circumstances, unspecified: Secondary | ICD-10-CM

## 2017-01-23 DIAGNOSIS — G8929 Other chronic pain: Secondary | ICD-10-CM | POA: Diagnosis present

## 2017-01-23 DIAGNOSIS — D649 Anemia, unspecified: Secondary | ICD-10-CM | POA: Diagnosis present

## 2017-01-23 DIAGNOSIS — I2510 Atherosclerotic heart disease of native coronary artery without angina pectoris: Secondary | ICD-10-CM | POA: Diagnosis present

## 2017-01-23 DIAGNOSIS — G4733 Obstructive sleep apnea (adult) (pediatric): Secondary | ICD-10-CM | POA: Diagnosis present

## 2017-01-23 DIAGNOSIS — Z515 Encounter for palliative care: Secondary | ICD-10-CM | POA: Diagnosis not present

## 2017-01-23 DIAGNOSIS — N186 End stage renal disease: Secondary | ICD-10-CM | POA: Diagnosis not present

## 2017-01-23 DIAGNOSIS — T83511A Infection and inflammatory reaction due to indwelling urethral catheter, initial encounter: Principal | ICD-10-CM | POA: Diagnosis present

## 2017-01-23 DIAGNOSIS — N390 Urinary tract infection, site not specified: Secondary | ICD-10-CM | POA: Diagnosis present

## 2017-01-23 DIAGNOSIS — T83518A Infection and inflammatory reaction due to other urinary catheter, initial encounter: Secondary | ICD-10-CM | POA: Diagnosis not present

## 2017-01-23 DIAGNOSIS — Z7982 Long term (current) use of aspirin: Secondary | ICD-10-CM

## 2017-01-23 DIAGNOSIS — I5032 Chronic diastolic (congestive) heart failure: Secondary | ICD-10-CM | POA: Diagnosis not present

## 2017-01-23 DIAGNOSIS — I272 Pulmonary hypertension, unspecified: Secondary | ICD-10-CM | POA: Diagnosis not present

## 2017-01-23 DIAGNOSIS — M25569 Pain in unspecified knee: Secondary | ICD-10-CM | POA: Diagnosis not present

## 2017-01-23 DIAGNOSIS — I5040 Unspecified combined systolic (congestive) and diastolic (congestive) heart failure: Secondary | ICD-10-CM | POA: Diagnosis not present

## 2017-01-23 DIAGNOSIS — Z818 Family history of other mental and behavioral disorders: Secondary | ICD-10-CM | POA: Diagnosis not present

## 2017-01-23 DIAGNOSIS — D473 Essential (hemorrhagic) thrombocythemia: Secondary | ICD-10-CM | POA: Diagnosis not present

## 2017-01-23 DIAGNOSIS — C946 Myelodysplastic disease, not classified: Secondary | ICD-10-CM | POA: Diagnosis present

## 2017-01-23 DIAGNOSIS — B965 Pseudomonas (aeruginosa) (mallei) (pseudomallei) as the cause of diseases classified elsewhere: Secondary | ICD-10-CM | POA: Diagnosis present

## 2017-01-23 DIAGNOSIS — R0902 Hypoxemia: Secondary | ICD-10-CM | POA: Diagnosis not present

## 2017-01-23 DIAGNOSIS — M109 Gout, unspecified: Secondary | ICD-10-CM | POA: Diagnosis present

## 2017-01-23 DIAGNOSIS — D471 Chronic myeloproliferative disease: Secondary | ICD-10-CM | POA: Diagnosis not present

## 2017-01-23 DIAGNOSIS — N189 Chronic kidney disease, unspecified: Secondary | ICD-10-CM

## 2017-01-23 DIAGNOSIS — R634 Abnormal weight loss: Secondary | ICD-10-CM | POA: Diagnosis present

## 2017-01-23 DIAGNOSIS — Z87891 Personal history of nicotine dependence: Secondary | ICD-10-CM | POA: Diagnosis not present

## 2017-01-23 DIAGNOSIS — E8770 Fluid overload, unspecified: Secondary | ICD-10-CM | POA: Diagnosis present

## 2017-01-23 DIAGNOSIS — M1A9XX0 Chronic gout, unspecified, without tophus (tophi): Secondary | ICD-10-CM | POA: Diagnosis not present

## 2017-01-23 DIAGNOSIS — D729 Disorder of white blood cells, unspecified: Secondary | ICD-10-CM | POA: Diagnosis not present

## 2017-01-23 DIAGNOSIS — E440 Moderate protein-calorie malnutrition: Secondary | ICD-10-CM

## 2017-01-23 DIAGNOSIS — M549 Dorsalgia, unspecified: Secondary | ICD-10-CM | POA: Diagnosis present

## 2017-01-23 DIAGNOSIS — D7581 Myelofibrosis: Secondary | ICD-10-CM | POA: Diagnosis present

## 2017-01-23 DIAGNOSIS — Z9889 Other specified postprocedural states: Secondary | ICD-10-CM | POA: Diagnosis not present

## 2017-01-23 DIAGNOSIS — Z7189 Other specified counseling: Secondary | ICD-10-CM | POA: Diagnosis not present

## 2017-01-23 DIAGNOSIS — I517 Cardiomegaly: Secondary | ICD-10-CM | POA: Diagnosis not present

## 2017-01-23 DIAGNOSIS — Z9119 Patient's noncompliance with other medical treatment and regimen: Secondary | ICD-10-CM

## 2017-01-23 DIAGNOSIS — N179 Acute kidney failure, unspecified: Secondary | ICD-10-CM | POA: Diagnosis present

## 2017-01-23 DIAGNOSIS — Z79899 Other long term (current) drug therapy: Secondary | ICD-10-CM | POA: Diagnosis not present

## 2017-01-23 DIAGNOSIS — Z7951 Long term (current) use of inhaled steroids: Secondary | ICD-10-CM | POA: Diagnosis not present

## 2017-01-23 DIAGNOSIS — I132 Hypertensive heart and chronic kidney disease with heart failure and with stage 5 chronic kidney disease, or end stage renal disease: Secondary | ICD-10-CM | POA: Diagnosis present

## 2017-01-23 DIAGNOSIS — E1122 Type 2 diabetes mellitus with diabetic chronic kidney disease: Secondary | ICD-10-CM | POA: Diagnosis present

## 2017-01-23 DIAGNOSIS — Z7401 Bed confinement status: Secondary | ICD-10-CM

## 2017-01-23 DIAGNOSIS — M10031 Idiopathic gout, right wrist: Secondary | ICD-10-CM | POA: Diagnosis not present

## 2017-01-23 DIAGNOSIS — R0602 Shortness of breath: Secondary | ICD-10-CM | POA: Diagnosis not present

## 2017-01-23 DIAGNOSIS — N183 Chronic kidney disease, stage 3 unspecified: Secondary | ICD-10-CM | POA: Diagnosis present

## 2017-01-23 DIAGNOSIS — J9610 Chronic respiratory failure, unspecified whether with hypoxia or hypercapnia: Secondary | ICD-10-CM | POA: Diagnosis present

## 2017-01-23 DIAGNOSIS — D72829 Elevated white blood cell count, unspecified: Secondary | ICD-10-CM | POA: Diagnosis not present

## 2017-01-23 DIAGNOSIS — Z91199 Patient's noncompliance with other medical treatment and regimen due to unspecified reason: Secondary | ICD-10-CM

## 2017-01-23 LAB — CBC WITH DIFFERENTIAL/PLATELET
BAND NEUTROPHILS: 0 %
BASOS PCT: 0 %
Basophils Absolute: 0 10*3/uL (ref 0.0–0.1)
Blasts: 0 %
EOS ABS: 0.4 10*3/uL (ref 0.0–0.7)
Eosinophils Relative: 3 %
HCT: 33.3 % — ABNORMAL LOW (ref 39.0–52.0)
Hemoglobin: 9.9 g/dL — ABNORMAL LOW (ref 13.0–17.0)
LYMPHS PCT: 25 %
Lymphs Abs: 3.2 10*3/uL (ref 0.7–4.0)
MCH: 25.6 pg — AB (ref 26.0–34.0)
MCHC: 29.7 g/dL — AB (ref 30.0–36.0)
MCV: 86 fL (ref 78.0–100.0)
MONO ABS: 0.9 10*3/uL (ref 0.1–1.0)
MONOS PCT: 7 %
Metamyelocytes Relative: 0 %
Myelocytes: 0 %
NEUTROS ABS: 8.3 10*3/uL — AB (ref 1.7–7.7)
Neutrophils Relative %: 65 %
OTHER: 0 %
PLATELETS: 503 10*3/uL — AB (ref 150–400)
Promyelocytes Absolute: 0 %
RBC: 3.87 MIL/uL — ABNORMAL LOW (ref 4.22–5.81)
RDW: 19.6 % — AB (ref 11.5–15.5)
WBC: 12.8 10*3/uL — ABNORMAL HIGH (ref 4.0–10.5)
nRBC: 0 /100 WBC

## 2017-01-23 LAB — URINALYSIS, ROUTINE W REFLEX MICROSCOPIC
Bilirubin Urine: NEGATIVE
Glucose, UA: NEGATIVE mg/dL
KETONES UR: NEGATIVE mg/dL
Nitrite: NEGATIVE
PH: 5 (ref 5.0–8.0)
Protein, ur: >=300 mg/dL — AB
Specific Gravity, Urine: 1.012 (ref 1.005–1.030)

## 2017-01-23 LAB — BASIC METABOLIC PANEL
ANION GAP: 7 (ref 5–15)
BUN: 31 mg/dL — ABNORMAL HIGH (ref 6–20)
CALCIUM: 9.2 mg/dL (ref 8.9–10.3)
CO2: 23 mmol/L (ref 22–32)
Chloride: 106 mmol/L (ref 101–111)
Creatinine, Ser: 1.13 mg/dL (ref 0.61–1.24)
GFR calc non Af Amer: >60 mL/min (ref >60)
Glucose, Bld: 108 mg/dL — ABNORMAL HIGH (ref 65–99)
Potassium: 4.3 mmol/L (ref 3.5–5.1)
Sodium: 136 mmol/L (ref 135–145)

## 2017-01-23 MED ORDER — CEFTRIAXONE SODIUM 1 G IJ SOLR
1.0000 g | Freq: Once | INTRAMUSCULAR | Status: AC
  Administered 2017-01-23: 1 g via INTRAVENOUS
  Filled 2017-01-23: qty 10

## 2017-01-23 NOTE — ED Notes (Signed)
MD speaking to patient at this time

## 2017-01-23 NOTE — ED Triage Notes (Signed)
Per EMS:  Patient presents to ED for assessment of bilateral knee, left hip and back pain, all chronic in nature, but patient states worse today than normal.  Pt is bed bound and only gets up once or twice a day with the help of his son.  Pt wears 2L O2 at home.  Able to converse with EMS en route, NAD.

## 2017-01-23 NOTE — ED Notes (Signed)
Patient taken to private area to empty and clamp catheter.  Will pull sample shortly.    Patient provided with Diet Coke, okay'd by MD

## 2017-01-23 NOTE — ED Notes (Signed)
Admitting provider approved a cardiac diet for pt.  Pt given [place] sandwich

## 2017-01-23 NOTE — ED Provider Notes (Signed)
[facility] DEPT Provider Note   CSN: [id number] Arrival date & time: 01/23/17  1737     History   Chief Complaint Chief Complaint  Patient presents with  . Back Pain   HPI  Mr. Mcgibbon is a 76yo male with PMH significant for myeloproliferative disease, CAD, gout, and chronic respiratory failure (on 2L O2) who presents with back pain, bilateral hip pain, and shoulder pain which he states are chronic. Patient is a poor historian. However, he states the for the last 5 days, the pain has been worsening. He is on Norco/Vicodin at home which he states helps the pain a little. Denies trauma or falls, as well as fevers, chest pain, or dysuria. He was diagnosed with myeloproliferative disease a while ago, but apparently never wanted a work-up. He was hospitalized 8/5-12/24/2016 and got CT abd/pelvis on 8/5 that showed chronic abnormal bone mineralization diffusely with diffuse abnormal sclerosis, as well as bilateral moderate hydronephrosis, hydroureter, and bladder distention. He did get a right iliac crest biopsy, which showed extensively fibrotic medullary space. He was discharged with a foley and told to follow up with Dr. Jeffie Pollock in 1-2 weeks for foley care and with Dr. Marin Olp to follow up on his myeloproliferative disease. Patient states that his PCP appointment was not until later and that he was told his CT scan was normal and that he did not need to follow up with his oncologist. However, he also states that he has trouble getting to [place] to visit his PCP and oncologist. He states he has trouble affording his medications as well.  Denies smoking, alcohol use, or other illicit drugs. Lives at home with his son. Is bed-bound and only gets up once or twice a day with the help of his son.   Past Medical History:  Diagnosis Date  . Allergic rhinitis   . Anemia   . Ascending aortic aneurysm ([place]) 03/2014   4.3cm on CT scan  . CAD (coronary artery disease)    dx elsewheer in past, no  documentation. Non-ischemic myoview 2007  . Chronic diastolic CHF (congestive heart failure), NYHA class 2 (HCC)    Normal EF w/ grade 1 dd by echo 12/2015  . Edema    R>L leg, u/s 5-12 neg for DVT  . Hemorrhoid   . History of thrombocytosis   . Hypertension   . Migraine    "once/wk at least" (07/11/2013)  . Myeloproliferative disease ([place])   . Shortness of breath   . Sinus congestion   . Sleep apnea, obstructive    at some point used CPAP, was d/c  years ago  . Type II diabetes mellitus [facility])     Patient Active Problem List   Diagnosis Date Noted  . Right hip pain 12/24/2016  . 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 Muscle spasm 11/16/2016  . Strep throat 11/16/2016  . Neck pain 11/14/2016  . Elevated troponin 11/14/2016  . Arthritis   . Recurrent pneumonia 10/31/2016  . Acute extremity pain 10/31/2016  . HCAP (healthcare-associated pneumonia) 10/31/2016  . 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 Pulmonary hypertension ([place])   . Sprain of left rotator cuff capsule   . Hyperkalemia   . Acute kidney injury superimposed on CKD ([place])   . Fever 10/08/2016  . Left arm pain 10/08/2016  . Paroxysmal SVT (supraventricular tachycardia) ([place]) 04/20/2016  . Type II diabetes mellitus ([place]) 04/20/2016  . Gout 04/20/2016  . Hyperuricemia 04/20/2016  . B12 deficiency 04/20/2016  . 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 Hydronephrosis   . Thrombocytosis ([place]) 04/07/2016  . Anemia 04/07/2016  . Community acquired pneumonia 04/01/2016  . Pneumonia 04/01/2016  . Pressure injury of skin 04/01/2016  . Pressure ulcer stage II 12/31/2015  . UTI (urinary tract infection) 12/30/2015  . Sepsis ([place]) 12/29/2015  . Acute lower UTI 12/29/2015  . T wave inversion in EKG 12/29/2015  . PCP NOTES >>>>>>>>>>>>>>>>> 05/02/2015  . Arm pain   . Peripheral edema   . Unable to ambulate   . Thoracic aortic aneurysm ([place]) 04/26/2014  . CKD (chronic kidney disease) stage 3,  GFR 30-59 ml/min 04/26/2014  . 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 Chest pain 04/15/2014  . Encounter for palliative care 04/06/2014  . Generalized weakness 04/06/2014  . Venous stasis dermatitis of both lower extremities   . Malnutrition of moderate degree ([place]) 03/31/2014  . Morbid obesity due to excess calories ([place]) 03/29/2014  . 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 Poor compliance with advise  05/05/2012  . Annual physical exam 01/14/2012  . Weight loss 01/14/2012  . *Chronic lower extremity edema, stasis dermatitis 06/09/2011  . CARDIOMEGALY 04/29/2009  . *Prediabetes 09/07/2006  . Symptomatic anemia 09/07/2006  . Myeloproliferative disease ([place]) 09/07/2006  . Obstructive sleep apnea 09/07/2006  . Essential hypertension 09/07/2006  . Coronary atherosclerosis 09/07/2006  . Chronic diastolic CHF (congestive heart failure) ([place]) 09/07/2006  . Allergic rhinitis 09/07/2006    Past Surgical History:  Procedure Laterality Date  . TOE SURGERY Right    "tried to straighten out big toe" (07/11/2013)       Home Medications    Prior to Admission medications   Medication Sig Start Date End Date Taking? Authorizing Provider  acetaminophen (TYLENOL) 325 MG tablet Take 2 tablets (650 mg total) by mouth every 6 (six) hours as needed for mild pain, fever or headache. Patient not taking: Reported on 01/09/2017 11/16/16   Janece Canterbury, MD  acetaminophen (TYLENOL) 500 MG tablet Take 500 mg by mouth every 6 (six) hours as needed for mild pain or moderate pain.    [provider]  aspirin 325 MG tablet Take 325 mg by mouth daily.    [provider]  B Complex Vitamins (VITAMIN B COMPLEX PO) Take 100 mg by mouth daily.    [provider]  bisacodyl (DULCOLAX) 10 MG suppository Place 1 suppository (10 mg total) rectally daily as needed for moderate constipation. 04/18/16   Hongalgi, Lenis Dickinson, MD  diclofenac sodium (VOLTAREN) 1 % GEL  Apply 4 g topically 2 (two) times daily as needed (pain). Apply to bilateral  knees     [provider]  doxycycline (VIBRAMYCIN) 100 MG capsule Take 1 capsule (100 mg total) by mouth 2 (two) times daily. 01/09/17   Glyn Ade, PA-C  doxycycline (VIBRAMYCIN) 100 MG capsule Take 1 capsule (100 mg total) by mouth 2 (two) times daily. 01/11/17   Horton, Barbette Hair, MD  feeding supplement, GLUCERNA SHAKE, (GLUCERNA SHAKE) LIQD Take 118.5 mLs by mouth daily. Reported on 08/21/2015    [provider]  ferrous gluconate (FERGON) 240 (27 FE) MG tablet Take 240 mg by mouth daily.    [provider]  fluticasone (FLONASE) 50 MCG/ACT nasal spray Place 2 sprays into both nostrils daily. Reported on 05/02/2015    [provider]  furosemide (LASIX) 40 MG tablet Take 40 mg by mouth daily.    [provider]  gabapentin (NEURONTIN) 100  MG capsule Take 2 capsules (200 mg total) by mouth 3 (three) times daily as needed (pain). Reported on 08/22/2015 10/17/16   Rama, Venetia Maxon, MD  hydrocerin (EUCERIN) CREA Apply Eucerin cream to BLE Q day after bathing and roughly towel drying to remove loose skin Patient taking differently: Apply 1 application topically daily as needed (dryness).  01/27/14   Ghimire, Henreitta Leber, MD  hydrOXYzine (ATARAX/VISTARIL) 10 MG tablet Take 1 tablet (10 mg total) by mouth every 4 (four) hours as needed for itching. 01/11/17   Gareth Morgan, MD  indomethacin (INDOCIN) 50 MG capsule Take 1 capsule (50 mg total) by mouth 3 (three) times daily with meals. 01/09/17   Glyn Ade, PA-C  methocarbamol (ROBAXIN) 750 MG tablet Take 1 tablet (750 mg total) by mouth 3 (three) times daily. Patient not taking: Reported on 01/09/2017 10/17/16   Rama, Venetia Maxon, MD  nitroGLYCERIN (NITROSTAT) 0.4 MG SL tablet Place 1 tablet (0.4 mg total) under the tongue every 5 (five) minutes as needed for chest pain. 01/06/17 04/06/17  Shirley Friar, PA-C  OXYGEN  Inhale 2 L into the lungs continuous.    [provider]  potassium chloride SA (K-DUR,KLOR-CON) 20 MEQ tablet Take 20 mEq by mouth as needed (leg cramps).    [provider]    Family History Family History  Problem Relation Age of Onset  . Schizophrenia Son   . Colon cancer Neg Hx   . Prostate cancer Neg Hx   . Heart attack Neg Hx   . Diabetes Neg Hx     Social History Social History  Substance Use Topics  . Smoking status: Former Smoker    Packs/day: 0.25    Years: 12.00    Types: Cigarettes    Quit date: 05/18/1966  . Smokeless tobacco: Never Used     Comment: quit smoking 45 years ago  . Alcohol use No     Allergies   Patient has no known allergies.   Review of Systems Review of Systems  Unremarkable except as stated above in HPI.  Physical Exam Updated Vital Signs BP (!) 159/77 (BP Location: Right Arm)   Pulse 78   Temp 98.2 F (36.8 C) (Oral)   Resp 17   SpO2 98%   Physical Exam GEN: Lying in bed in NAD; able to speak in full sentences, on 2L O2 RESP: Clear to auscultation bilaterally. No wheezes, rales, or rhonchi. No increased work of breathing CV: Normal rate and regular rhythm. No murmurs, gallops, or rubs. No LE edema. ABD: Soft. Slightly tender to palpation in suprapubic region. Non-distended. Normoactive bowel sounds. EXT: No edema. Warm and well perfused. Mildly tender to palpation on bilateral hips and shoulder. Brace on left wrist from surgery for gout URINARY: Has had foley in for ~1 month NEURO: Cranial nerves II-XII grossly intact. Able to lift all four extremities against gravity.  ED Treatments / Results  Labs (all labs ordered are listed, but only abnormal results are displayed) Labs Reviewed  CBC WITH DIFFERENTIAL/PLATELET - Abnormal; Notable for the following:       Result Value   WBC 12.8 (*)    RBC 3.87 (*)    Hemoglobin 9.9 (*)    HCT 33.3 (*)    MCH 25.6 (*)    MCHC 29.7 (*)    RDW 19.6 (*)    Platelets  503 (*)    Neutro Abs 8.3 (*)    All other components within normal limits  URINALYSIS,  ROUTINE W REFLEX MICROSCOPIC - Abnormal; Notable for the following:    Color, Urine AMBER (*)    APPearance CLOUDY (*)    Hgb urine dipstick SMALL (*)    Protein, ur >=300 (*)    Leukocytes, UA LARGE (*)    Bacteria, UA MANY (*)    Squamous Epithelial / LPF 0-5 (*)    All other components within normal limits  BASIC METABOLIC PANEL - Abnormal; Notable for the following:    Glucose, Bld 108 (*)    BUN 31 (*)    All other components within normal limits  URINE CULTURE    EKG  EKG Interpretation None       Radiology Dg Chest 2 View  Result Date: 01/23/2017 CLINICAL DATA:  Weakness, shortness of breath, not feeling well, history hypertension, coronary artery disease, type II diabetes mellitus, myeloproliferative disorder, CHF EXAM: CHEST  2 VIEW COMPARISON:  12/26/2016 FINDINGS: Enlargement of cardiac silhouette. Mediastinal contours normal. Chronic accentuation of interstitial markings likely mild pulmonary edema. No pleural effusion or pneumothorax. Diffuse osseous sclerosis consistent with history of myeloproliferative disorder. IMPRESSION: Probable mild pulmonary edema. Electronically Signed   By: Lavonia Dana M.D.   On: 01/23/2017 19:26    Procedures Procedures (including critical care time)  Medications Ordered in ED Medications  cefTRIAXone (ROCEPHIN) 1 g in dextrose 5 % 50 mL IVPB (not administered)     Initial Impression / Assessment and Plan / ED Course  I have reviewed the triage vital signs and the nursing notes.  Pertinent labs & imaging results that were available during my care of the patient were reviewed by me and considered in my medical decision making (see chart for details).  [name] is a 76yo male with PMH of myeloproliferative disease who presents with chronic back pain, bilateral hip pain, and shoulder pain that he feels has worsened in the last 5 days. Does take  Norco/Vicodin for the pain. He is a poor historian, has poor insight into his disease process, and has some difficulty affording his medications. He was admitted 8/5-8/9, where foley was placed and biopsy was done. Told to follow up with PCP and oncologist, which patient does not remember being told to do and has not done. States he has difficulty getting to [facility], which is where his doctors are located. Patient lives at home with his son, but I think he needs a higher level of home care. Will consult social work for assistance.  Foley has now been in place for ~1 month. Endorses suprapubic pain. Afebrile. Will check CBC, BMP, UA, and CXR.  10:44pm CBC with elevated WBC to 12.8, as well as thrombocytosis and anemia, consistent with hx of known myeloproliferative disorder. UA consistent with UTI, likely from prolonged foley catheter. Starting on IV ceftriaxone 1g and will admit to hospitalist service for UTI and social work consult in AM for assistance with placement.  Patient seen and discussed with Dr. Vanita Panda.  Final Clinical Impressions(s) / ED Diagnoses   Final diagnoses:  Urinary tract infection associated with catheterization of urinary tract, unspecified indwelling urinary catheter type, initial encounter [facility])    New Prescriptions New Prescriptions   No medications on file     Colbert Ewing, MD 01/23/17 2257    Carmin Muskrat, MD 01/24/17 Shelah Lewandowsky

## 2017-01-23 NOTE — ED Notes (Addendum)
Admitting at bedside - patient asking for food

## 2017-01-24 DIAGNOSIS — G8929 Other chronic pain: Secondary | ICD-10-CM

## 2017-01-24 DIAGNOSIS — I5032 Chronic diastolic (congestive) heart failure: Secondary | ICD-10-CM

## 2017-01-24 DIAGNOSIS — I272 Pulmonary hypertension, unspecified: Secondary | ICD-10-CM

## 2017-01-24 DIAGNOSIS — M1A9XX0 Chronic gout, unspecified, without tophus (tophi): Secondary | ICD-10-CM

## 2017-01-24 DIAGNOSIS — I517 Cardiomegaly: Secondary | ICD-10-CM

## 2017-01-24 DIAGNOSIS — M10031 Idiopathic gout, right wrist: Secondary | ICD-10-CM

## 2017-01-24 DIAGNOSIS — D471 Chronic myeloproliferative disease: Secondary | ICD-10-CM

## 2017-01-24 DIAGNOSIS — R339 Retention of urine, unspecified: Secondary | ICD-10-CM

## 2017-01-24 LAB — IRON AND TIBC
IRON: 48 ug/dL (ref 45–182)
Saturation Ratios: 27 % (ref 17.9–39.5)
TIBC: 178 ug/dL — ABNORMAL LOW (ref 250–450)
UIBC: 130 ug/dL

## 2017-01-24 LAB — BASIC METABOLIC PANEL
ANION GAP: 8 (ref 5–15)
BUN: 32 mg/dL — AB (ref 6–20)
CALCIUM: 9.5 mg/dL (ref 8.9–10.3)
CO2: 24 mmol/L (ref 22–32)
CREATININE: 1 mg/dL (ref 0.61–1.24)
Chloride: 109 mmol/L (ref 101–111)
GFR calc Af Amer: >60 mL/min (ref >60)
GLUCOSE: 95 mg/dL (ref 65–99)
Potassium: 4.2 mmol/L (ref 3.5–5.1)
Sodium: 141 mmol/L (ref 135–145)

## 2017-01-24 LAB — FERRITIN: FERRITIN: 448 ng/mL — AB (ref 24–336)

## 2017-01-24 LAB — CBC
HCT: 32.5 % — ABNORMAL LOW (ref 39.0–52.0)
Hemoglobin: 9.8 g/dL — ABNORMAL LOW (ref 13.0–17.0)
MCH: 25.9 pg — ABNORMAL LOW (ref 26.0–34.0)
MCHC: 30.2 g/dL (ref 30.0–36.0)
MCV: 86 fL (ref 78.0–100.0)
PLATELETS: 512 10*3/uL — AB (ref 150–400)
RBC: 3.78 MIL/uL — ABNORMAL LOW (ref 4.22–5.81)
RDW: 19.7 % — AB (ref 11.5–15.5)
WBC: 12.9 10*3/uL — ABNORMAL HIGH (ref 4.0–10.5)

## 2017-01-24 LAB — RETICULOCYTES
RBC.: 3.78 MIL/uL — ABNORMAL LOW (ref 4.22–5.81)
Retic Count, Absolute: 94.5 10*3/uL (ref 19.0–186.0)
Retic Ct Pct: 2.5 % (ref 0.4–3.1)

## 2017-01-24 MED ORDER — HYDROXYZINE HCL 10 MG PO TABS
10.0000 mg | ORAL_TABLET | ORAL | Status: DC | PRN
  Administered 2017-01-24: 10 mg via ORAL
  Filled 2017-01-24 (×2): qty 1

## 2017-01-24 MED ORDER — POLYETHYLENE GLYCOL 3350 17 G PO PACK: 17.0000 g | PACK | Freq: Every day | ORAL | Status: DC | PRN

## 2017-01-24 MED ORDER — METHOCARBAMOL 750 MG PO TABS
750.0000 mg | ORAL_TABLET | Freq: Three times a day (TID) | ORAL | Status: DC
  Administered 2017-01-24 – 2017-01-26 (×8): 750 mg via ORAL
  Filled 2017-01-24 (×10): qty 1

## 2017-01-24 MED ORDER — FERROUS GLUCONATE 324 (38 FE) MG PO TABS
324.0000 mg | ORAL_TABLET | Freq: Every day | ORAL | Status: DC
  Administered 2017-01-24 – 2017-01-25 (×2): 324 mg via ORAL
  Filled 2017-01-24 (×2): qty 1

## 2017-01-24 MED ORDER — ALLOPURINOL 100 MG PO TABS
100.0000 mg | ORAL_TABLET | Freq: Every day | ORAL | Status: DC
  Administered 2017-01-24 – 2017-01-26 (×3): 100 mg via ORAL
  Filled 2017-01-24 (×3): qty 1

## 2017-01-24 MED ORDER — ACETAMINOPHEN 650 MG RE SUPP: 650.0000 mg | Freq: Four times a day (QID) | RECTAL | Status: DC | PRN

## 2017-01-24 MED ORDER — FLUTICASONE PROPIONATE 50 MCG/ACT NA SUSP
2.0000 | Freq: Every day | NASAL | Status: DC
  Administered 2017-01-24 – 2017-01-26 (×3): 2 via NASAL
  Filled 2017-01-24: qty 16

## 2017-01-24 MED ORDER — INDOMETHACIN 25 MG PO CAPS
50.0000 mg | ORAL_CAPSULE | Freq: Three times a day (TID) | ORAL | Status: DC
  Administered 2017-01-24 – 2017-01-26 (×8): 50 mg via ORAL
  Filled 2017-01-24 (×9): qty 2

## 2017-01-24 MED ORDER — DICLOFENAC SODIUM 1 % TD GEL
4.0000 g | Freq: Four times a day (QID) | TRANSDERMAL | Status: DC
  Administered 2017-01-24 – 2017-01-26 (×7): 4 g via TOPICAL
  Filled 2017-01-24: qty 100

## 2017-01-24 MED ORDER — HEPARIN SODIUM (PORCINE) 5000 UNIT/ML IJ SOLN
5000.0000 [IU] | Freq: Three times a day (TID) | INTRAMUSCULAR | Status: DC
  Administered 2017-01-24 – 2017-01-26 (×8): 5000 [IU] via SUBCUTANEOUS
  Filled 2017-01-24 (×8): qty 1

## 2017-01-24 MED ORDER — ASPIRIN 325 MG PO TABS
325.0000 mg | ORAL_TABLET | Freq: Every day | ORAL | Status: DC
  Administered 2017-01-24 – 2017-01-26 (×3): 325 mg via ORAL
  Filled 2017-01-24 (×3): qty 1

## 2017-01-24 MED ORDER — HYDROCERIN EX CREA
TOPICAL_CREAM | Freq: Two times a day (BID) | CUTANEOUS | Status: DC
  Administered 2017-01-24 – 2017-01-26 (×4): via TOPICAL
  Filled 2017-01-24: qty 113

## 2017-01-24 MED ORDER — GLUCERNA SHAKE PO LIQD
118.5000 mL | Freq: Every day | ORAL | Status: DC
  Administered 2017-01-24 – 2017-01-25 (×2): 118.5 mL via ORAL

## 2017-01-24 MED ORDER — FUROSEMIDE 40 MG PO TABS: 40.0000 mg | ORAL_TABLET | Freq: Every day | ORAL | Status: DC

## 2017-01-24 MED ORDER — ACETAMINOPHEN 650 MG RE SUPP: 650.0000 mg | Freq: Four times a day (QID) | RECTAL | Status: DC

## 2017-01-24 MED ORDER — FUROSEMIDE 10 MG/ML IJ SOLN
40.0000 mg | Freq: Once | INTRAMUSCULAR | Status: AC
  Administered 2017-01-24: 40 mg via INTRAVENOUS
  Filled 2017-01-24: qty 4

## 2017-01-24 MED ORDER — ONDANSETRON HCL 4 MG/2ML IJ SOLN: 4.0000 mg | Freq: Four times a day (QID) | INTRAMUSCULAR | Status: DC | PRN

## 2017-01-24 MED ORDER — NITROGLYCERIN 0.4 MG SL SUBL: 0.4000 mg | SUBLINGUAL_TABLET | SUBLINGUAL | Status: DC | PRN

## 2017-01-24 MED ORDER — GABAPENTIN 100 MG PO CAPS
200.0000 mg | ORAL_CAPSULE | Freq: Three times a day (TID) | ORAL | Status: DC | PRN
  Administered 2017-01-24 – 2017-01-26 (×3): 200 mg via ORAL
  Filled 2017-01-24 (×4): qty 2

## 2017-01-24 MED ORDER — ACETAMINOPHEN 325 MG PO TABS
650.0000 mg | ORAL_TABLET | Freq: Four times a day (QID) | ORAL | Status: DC
  Administered 2017-01-24 – 2017-01-26 (×9): 650 mg via ORAL
  Filled 2017-01-24 (×10): qty 2

## 2017-01-24 MED ORDER — ACETAMINOPHEN 325 MG PO TABS
650.0000 mg | ORAL_TABLET | Freq: Four times a day (QID) | ORAL | Status: DC | PRN
  Filled 2017-01-24: qty 2

## 2017-01-24 MED ORDER — DEXTROSE 5 % IV SOLN: 1.0000 g | INTRAVENOUS | Status: DC

## 2017-01-24 MED ORDER — ONDANSETRON HCL 4 MG PO TABS: 4.0000 mg | ORAL_TABLET | Freq: Four times a day (QID) | ORAL | Status: DC | PRN

## 2017-01-24 NOTE — H&P (Signed)
PCP:   Colon Branch, MD   Chief Complaint: Back and flank pain   HPI: This is a 76 year old male was recently admitted and discharged in all this with urinary retention or hydronephrosis. He also had worsening myeloproliferative breast is disorder. He is to follow up with his doctors which he has not done. He has chronic pain in his back. This is has gotten significantly worse the past few days. He decided to come to ER. He is weak. He lives at home with his son. He request placement in nursing home. He states he hasn't gone his doctors f/u appointment because of financial issues. He has a Foley in place which has been here since his last admission. Negative fever, chills, nausea or vomiting.   Review of Systems:  The patient denies anorexia, fever, weight loss,, vision loss, decreased hearing, hoarseness, chest pain, syncope, dyspnea on exertion, peripheral edema, balance deficits, hemoptysis, weakness, abdominal pain, melena, hematochezia, back pain, severe indigestion/heartburn, hematuria, incontinence, genital sores, muscle weakness, suspicious skin lesions, transient blindness, difficulty walking, depression, unusual weight change, abnormal bleeding, enlarged lymph nodes, angioedema, and breast masses.  Past Medical History: Past Medical History:  Diagnosis Date  . Allergic rhinitis   . Anemia   . Ascending aortic aneurysm ([place]) 03/2014   4.3cm on CT scan  . CAD (coronary artery disease)    dx elsewheer in past, no documentation. Non-ischemic myoview 2007  . Chronic diastolic CHF (congestive heart failure), NYHA class 2 (HCC)    Normal EF w/ grade 1 dd by echo 12/2015  . Edema    R>L leg, u/s 5-12 neg for DVT  . Hemorrhoid   . History of thrombocytosis   . Hypertension   . Migraine    "once/wk at least" (07/11/2013)  . Myeloproliferative disease ([place])   . Shortness of breath   . Sinus congestion   . Sleep apnea, obstructive    at some point used CPAP, was d/c  years ago  . Type  II diabetes mellitus ([place])    Past Surgical History:  Procedure Laterality Date  . TOE SURGERY Right    "tried to straighten out big toe" (07/11/2013)    Medications: Prior to Admission medications   Medication Sig Start Date End Date Taking? Authorizing Provider  acetaminophen (TYLENOL) 325 MG tablet Take 2 tablets (650 mg total) by mouth every 6 (six) hours as needed for mild pain, fever or headache. Patient not taking: Reported on 01/09/2017 11/16/16   Janece Canterbury, MD  acetaminophen (TYLENOL) 500 MG tablet Take 500 mg by mouth every 6 (six) hours as needed for mild pain or moderate pain.    [provider]  aspirin 325 MG tablet Take 325 mg by mouth daily.    [provider]  B Complex Vitamins (VITAMIN B COMPLEX PO) Take 100 mg by mouth daily.    [provider]  bisacodyl (DULCOLAX) 10 MG suppository Place 1 suppository (10 mg total) rectally daily as needed for moderate constipation. 04/18/16   Hongalgi, Lenis Dickinson, MD  diclofenac sodium (VOLTAREN) 1 % GEL Apply 4 g topically 2 (two) times daily as needed (pain). Apply to bilateral  knees     [provider]  doxycycline (VIBRAMYCIN) 100 MG capsule Take 1 capsule (100 mg total) by mouth 2 (two) times daily. 01/09/17   Glyn Ade, PA-C  doxycycline (VIBRAMYCIN) 100 MG capsule Take 1 capsule (100 mg total) by mouth 2 (two) times daily. 01/11/17   Horton, Barbette Hair,  MD  feeding supplement, GLUCERNA SHAKE, (GLUCERNA SHAKE) LIQD Take 118.5 mLs by mouth daily. Reported on 08/21/2015    [provider]  ferrous gluconate (FERGON) 240 (27 FE) MG tablet Take 240 mg by mouth daily.    [provider]  fluticasone (FLONASE) 50 MCG/ACT nasal spray Place 2 sprays into both nostrils daily. Reported on 05/02/2015    [provider]  furosemide (LASIX) 40 MG tablet Take 40 mg by mouth daily.    [provider]  gabapentin (NEURONTIN) 100 MG capsule Take 2 capsules (200 mg total)  by mouth 3 (three) times daily as needed (pain). Reported on 08/22/2015 10/17/16   Rama, Venetia Maxon, MD  hydrocerin (EUCERIN) CREA Apply Eucerin cream to BLE Q day after bathing and roughly towel drying to remove loose skin Patient taking differently: Apply 1 application topically daily as needed (dryness).  01/27/14   Ghimire, Henreitta Leber, MD  hydrOXYzine (ATARAX/VISTARIL) 10 MG tablet Take 1 tablet (10 mg total) by mouth every 4 (four) hours as needed for itching. 01/11/17   Gareth Morgan, MD  indomethacin (INDOCIN) 50 MG capsule Take 1 capsule (50 mg total) by mouth 3 (three) times daily with meals. 01/09/17   Glyn Ade, PA-C  methocarbamol (ROBAXIN) 750 MG tablet Take 1 tablet (750 mg total) by mouth 3 (three) times daily. Patient not taking: Reported on 01/09/2017 10/17/16   Rama, Venetia Maxon, MD  nitroGLYCERIN (NITROSTAT) 0.4 MG SL tablet Place 1 tablet (0.4 mg total) under the tongue every 5 (five) minutes as needed for chest pain. 01/06/17 04/06/17  Shirley Friar, PA-C  OXYGEN Inhale 2 L into the lungs continuous.    [provider]  potassium chloride SA (K-DUR,KLOR-CON) 20 MEQ tablet Take 20 mEq by mouth as needed (leg cramps).    [provider]    Allergies:  No Known Allergies  Social History:  reports that he quit smoking about 50 years ago. His smoking use included Cigarettes. He has a 3.00 pack-year smoking history. He has never used smokeless tobacco. He reports that he does not drink alcohol or use drugs.  Family History: Family History  Problem Relation Age of Onset  . Schizophrenia Son   . Colon cancer Neg Hx   . Prostate cancer Neg Hx   . Heart attack Neg Hx   . Diabetes Neg Hx     Physical Exam: Vitals:   01/23/17 1739 01/23/17 1744 01/23/17 2235  BP:  (!) 149/86 (!) 159/77  Pulse:  63 78  Resp:  17 17  Temp:  98.2 F (36.8 C)   TempSrc:  Oral   SpO2: 95% 99% 98%    General:  Alert and oriented times three, well developed and  nourished, Weak, chronically ill-appearing gentleman Eyes: pink conjunctiva, no scleral icterus ENT: Moist oral mucosa, neck supple, no thyromegaly Lungs: clear to ascultation, no wheeze, no crackles, no use of accessory muscles Cardiovascular: regular rate and rhythm, no regurgitation, no gallops, no murmurs. No carotid bruits, no JVD Abdomen: soft, positive BS, non-tender, non-distended, no organomegaly, not an acute abdomen GU: not examined Neuro: CN II - XII grossly intact, sensation intact Musculoskeletal: strength 5/5 all extremities, no clubbing, cyanosis or edema Skin: no rash, no subcutaneous crepitation, no decubitus Psych: appropriate patient   Labs on Admission:   Recent Labs  01/23/17 1902  NA 136  K 4.3  CL 106  CO2 23  GLUCOSE 108*  BUN 31*  CREATININE 1.13  CALCIUM 9.2  No results for input(s): AST, ALT, ALKPHOS, BILITOT, PROT, ALBUMIN in the last 72 hours. No results for input(s): LIPASE, AMYLASE in the last 72 hours.  Recent Labs  01/23/17 1902  WBC 12.8*  NEUTROABS 8.3*  HGB 9.9*  HCT 33.3*  MCV 86.0  PLT 503*   No results for input(s): CKTOTAL, CKMB, CKMBINDEX, TROPONINI in the last 72 hours. Invalid input(s): POCBNP No results for input(s): DDIMER in the last 72 hours. No results for input(s): HGBA1C in the last 72 hours. No results for input(s): CHOL, HDL, LDLCALC, TRIG, CHOLHDL, LDLDIRECT in the last 72 hours. No results for input(s): TSH, T4TOTAL, T3FREE, THYROIDAB in the last 72 hours.  Invalid input(s): FREET3 No results for input(s): VITAMINB12, FOLATE, FERRITIN, TIBC, IRON, RETICCTPCT in the last 72 hours.  Micro Results: No results found for this or any previous visit (from the past 240 hour(s)).   Radiological Exams on Admission: Dg Chest 2 View  Result Date: 01/23/2017 CLINICAL DATA:  Weakness, shortness of breath, not feeling well, history hypertension, coronary artery disease, type II diabetes mellitus, myeloproliferative  disorder, CHF EXAM: CHEST  2 VIEW COMPARISON:  12/26/2016 FINDINGS: Enlargement of cardiac silhouette. Mediastinal contours normal. Chronic accentuation of interstitial markings likely mild pulmonary edema. No pleural effusion or pneumothorax. Diffuse osseous sclerosis consistent with history of myeloproliferative disorder. IMPRESSION: Probable mild pulmonary edema. Electronically Signed   By: Lavonia Dana M.D.   On: 01/23/2017 19:26    Assessment/Plan Present on Admission: . Acute lower UTI -Admit to MedSurg -Urine cultures and blood cultures ordered -IV Rocephin  Deconditioning -PT consulted -Social worker consulted. Patient is agreeable to discharge him to the nursing home  . Urinary retention -Change foley  mild pulmonary edema -2-D echo ordered, BNP now and in a.m. -Single dose IV Lasix ordered  Anemia -Monitor, anemia panel. Occult stool  . Chronic diastolic CHF (congestive heart failure) (HCC) -Stable, home medications resumed  . Gout -Stable, home medications resumed  . Myeloproliferative disease ([place]) -Follow up with outpatient's oncology   Latanja Lehenbauer 01/24/2017, 12:23 AM

## 2017-01-24 NOTE — Progress Notes (Addendum)
PROGRESS NOTE    Juan [name]   EHM:[id number]  DOB: 04-12-1941  DOA: 01/23/2017 PCP: Colon Branch, MD   Brief Narrative:  Juan [name]  77 y.o.malewith medical history significant of coronary artery disease, chronic diastolic congestive heart failure on diuretics, history of myeloproliferative disease did not follow up with oncologist, chronic anemia, chronic respiratory failure with hypoxia on 2 L of oxygen at home, gout presented with worsening right hip and flank pain. Patient is a poor historian.tells me he is here for the gout pain in his hips and back and feet. Walks "a little bit".   Admitted from 8/5- 8/9 with a complaint of right hip pain and found to have severe sclerosis of the hip, treated for myeloproliferative disorder, discharged with foley due to U retention.   8/11- Came back to ER when sent by a [facility] who though he may need more O2. Pulse ox was normal and he was sent home from the ER.   8/14- came to ER to have Foley assessed - stated he was told that he could return to the hospital for this. It was discovered that no one had picked up the Ceftin that had been prescribed for him. Urine in ER neg for UTI- sent for culture- asked to f/u with Urology.  8/22 cardiology visit with [facility]- no changes made  8/25 back to ER for pain and swelling in right foot- started on Indomethacin TID for gout and some antibiotics for cellulitis (not mentioned in ER notes)  8/26- back in ER for same right foot issue- he was not able to get prescriptions filled and took some left over antibiotics- case management asked to help with meds  8/29- back in ER for the same- apparently despite case management involvement, he was not able to get the prescriptions   Subjective: See above- I called the number for his daughter listed for daughter but no one picked up, no voicemail  ROS: no complaints of nausea, vomiting, constipation diarrhea, cough, dyspnea or dysuria. No other complaints.   Assessment &  Plan:   Active Problems: Chronic hip back and foot pain - underlying myeloproliferative disorder - PET scan>> diffusely increased tracer localization throughout the axial and appendicular skeleton - this pattern can also be seen in patients with diffuse metastatic disease and metabolic bone disease (metabolic disease considered less likely in this patient due to absence of skull involvement). - will involve palliative care to address [place] with patient and family and help with long term pain control and repeated hospital admissions - Tylenol QID, Robaxin 750 TID, Indocin 50 mg TID, Neurontin 200 mg TID - apparently he wants to go to SNF- PT eval- per RN today, the patient did not have too much difficulty with mobility    Myeloproliferative disease  - evaluated by Dr Marin Olp last admission- will notify him tomorrow of admission - see PET scan as above - bone marrow biopsy>>BONE MARROW WITH EXTENSIVE FIBROSIS AND NEW BONE FORMATION- favor,  myeloproliferative neoplasm particularly primary myelofibrosis or a myeloproliferative/myelodysplastic neoplasm with associated fibrosis - cont Feragon  Weight loss - likely due to above MPD-  weight in 9/16 was 245 lb and now is 184 lb    Urinary retention/   Acute lower UTI? - I am not certain he has a UTI- UA can be positive in setting of foley- d/c Rocephin - required foley last admission- has not yet received a voiding trial - I did order Flomax for him when last discharged but he  is currently not on this - will d/c foley today and watch urine output  Chronic grade 1 diastolic CHF (congestive heart failure) ([place]) Pulmonary hypertension severe - most recent ECHO 8/18  - hold lasix and follow weights while in hospital  CAD - ASA  Gout - start Allopurinol again for prophylaxis  Social/disposition:  Very poor insight into medical conditions. Attempt to have a GOC discussion in 11/17. Patient refused to discuss it.  He has had numerous ER visits  and hospitalizations this year. Difficult to find family and they are never here when he is in the ER. Will try to find family. Palliative consult   DVT prophylaxis: heparin Code Status: Full code Family Communication:  Tried to call daughter Disposition Plan:  Consultants:    Procedures:    Antimicrobials:  Anti-infectives    Start     Dose/Rate Route Frequency Ordered Stop   01/24/17 2300  cefTRIAXone (ROCEPHIN) 1 g in dextrose 5 % 50 mL IVPB     1 g 100 mL/hr over 30 Minutes Intravenous Every 24 hours 01/24/17 0057     01/23/17 2245  cefTRIAXone (ROCEPHIN) 1 g in dextrose 5 % 50 mL IVPB     1 g 100 mL/hr over 30 Minutes Intravenous  Once 01/23/17 2241 01/23/17 2339       Objective: Vitals:   01/23/17 1739 01/23/17 1744 01/23/17 2235 01/24/17 0204  BP:  (!) 149/86 (!) 159/77 (!) 163/80  Pulse:  63 78 78  Resp:  '17 17 20  '$ Temp:  98.2 F (36.8 C)  98.4 F (36.9 C)  TempSrc:  Oral    SpO2: 95% 99% 98% 90%    Intake/Output Summary (Last 24 hours) at 01/24/17 1321 Last data filed at 01/24/17 0510  Gross per 24 hour  Intake              120 ml  Output              850 ml  Net             -730 ml   There were no vitals filed for this visit.  Examination: General exam: Appears comfortable  HEENT: PERRLA, oral mucosa moist, no sclera icterus or thrush Respiratory system: Clear to auscultation. Respiratory effort normal. Cardiovascular system: S1 & S2 heard, RRR.  No murmurs  Gastrointestinal system: Abdomen soft, non-tender, nondistended. Normal bowel sound. No organomegaly Central nervous system: Alert and oriented. No focal neurological deficits. Extremities: No cyanosis, clubbing or edema Skin: No rashes or ulcers Psychiatry:  Mood & affect appropriate.     Data Reviewed: I have personally reviewed following labs and imaging studies  CBC:  Recent Labs Lab 01/23/17 1902 01/24/17 0425  WBC 12.8* 12.9*  NEUTROABS 8.3*  --   HGB 9.9* 9.8*  HCT 33.3*  32.5*  MCV 86.0 86.0  PLT 503* 295*   Basic Metabolic Panel:  Recent Labs Lab 01/23/17 1902 01/24/17 0425  NA 136 141  K 4.3 4.2  CL 106 109  CO2 23 24  GLUCOSE 108* 95  BUN 31* 32*  CREATININE 1.13 1.00  CALCIUM 9.2 9.5   GFR: Estimated Creatinine Clearance: 75.4 mL/min (by C-G formula based on SCr of 1 mg/dL). Liver Function Tests: No results for input(s): AST, ALT, ALKPHOS, BILITOT, PROT, ALBUMIN in the last 168 hours. No results for input(s): LIPASE, AMYLASE in the last 168 hours. No results for input(s): AMMONIA in the last 168 hours. Coagulation Profile: No results for input(s):  INR, PROTIME in the last 168 hours. Cardiac Enzymes: No results for input(s): CKTOTAL, CKMB, CKMBINDEX, TROPONINI in the last 168 hours. BNP (last 3 results) No results for input(s): PROBNP in the last 8760 hours. HbA1C: No results for input(s): HGBA1C in the last 72 hours. CBG: No results for input(s): GLUCAP in the last 168 hours. Lipid Profile: No results for input(s): CHOL, HDL, LDLCALC, TRIG, CHOLHDL, LDLDIRECT in the last 72 hours. Thyroid Function Tests: No results for input(s): TSH, T4TOTAL, FREET4, T3FREE, THYROIDAB in the last 72 hours. Anemia Panel:  Recent Labs  01/24/17 0425  FERRITIN 448*  TIBC 178*  IRON 48  RETICCTPCT 2.5   Urine analysis:    Component Value Date/Time   COLORURINE AMBER (A) 01/23/2017 2120   APPEARANCEUR CLOUDY (A) 01/23/2017 2120   LABSPEC 1.012 01/23/2017 2120   PHURINE 5.0 01/23/2017 2120   GLUCOSEU NEGATIVE 01/23/2017 2120   GLUCOSEU NEGATIVE 05/02/2015 1008   HGBUR SMALL (A) 01/23/2017 2120   BILIRUBINUR NEGATIVE 01/23/2017 2120   KETONESUR NEGATIVE 01/23/2017 2120   PROTEINUR >=300 (A) 01/23/2017 2120   UROBILINOGEN 0.2 05/02/2015 1008   NITRITE NEGATIVE 01/23/2017 2120   LEUKOCYTESUR LARGE (A) 01/23/2017 2120   Sepsis Labs: '@LABRCNTIP'$ (procalcitonin:4,lacticidven:4) )No results found for this or any previous visit (from the past  240 hour(s)).       Radiology Studies: Dg Chest 2 View  Result Date: 01/23/2017 CLINICAL DATA:  Weakness, shortness of breath, not feeling well, history hypertension, coronary artery disease, type II diabetes mellitus, myeloproliferative disorder, CHF EXAM: CHEST  2 VIEW COMPARISON:  12/26/2016 FINDINGS: Enlargement of cardiac silhouette. Mediastinal contours normal. Chronic accentuation of interstitial markings likely mild pulmonary edema. No pleural effusion or pneumothorax. Diffuse osseous sclerosis consistent with history of myeloproliferative disorder. IMPRESSION: Probable mild pulmonary edema. Electronically Signed   By: Lavonia Dana M.D.   On: 01/23/2017 19:26      Scheduled Meds: . aspirin  325 mg Oral Daily  . feeding supplement (GLUCERNA SHAKE)  118.5 mL Oral Daily  . ferrous gluconate  324 mg Oral Daily  . fluticasone  2 spray Each Nare Daily  . heparin  5,000 Units Subcutaneous Q8H  . hydrocerin   Topical BID  . indomethacin  50 mg Oral TID WC  . methocarbamol  750 mg Oral TID   Continuous Infusions: . cefTRIAXone (ROCEPHIN)  IV       LOS: 1 day    Time spent in minutes: 35    Debbe Odea, MD [facility] Pager: [email or web address] Password [email or web address] 01/24/2017, 1:21 PM

## 2017-01-24 NOTE — ED Notes (Signed)
Attempted to give report 

## 2017-01-24 NOTE — Progress Notes (Signed)
Patient arrived to unit. Patient oriented to room. Bed alarm activated. Patient informed to call for help before getting out of bed. Foley change per MD order

## 2017-01-24 NOTE — Progress Notes (Signed)
PT Cancellation Note  Patient Details Name: Juan [name] MRN: [id number] DOB: 1940-09-29   Cancelled Treatment:    Reason Eval/Treat Not Completed: Patient declined, no reason specified   Tells me he is tired and only wants to sleep;  Will return at a later date for PT eval;   Roney Marion, [place] Pager [phone] Office [phone]    Colletta [place] 01/24/2017, 4:55 PM

## 2017-01-24 NOTE — Progress Notes (Signed)
Page sent to [facility] night coverage X. Blount making her aware HS meds held due to patient being too sleepy to awaken to take medications.  VSS, patient does awaken when turned, touched, but falls immediately back to sleep. Patient has been sleepy since Methocarbamol was given at 1735 per day shift RN. RN will continue to monitor patient and report any issues.  P.J. Linus Mako, RN

## 2017-01-25 DIAGNOSIS — N183 Chronic kidney disease, stage 3 (moderate): Secondary | ICD-10-CM

## 2017-01-25 DIAGNOSIS — Z7189 Other specified counseling: Secondary | ICD-10-CM

## 2017-01-25 DIAGNOSIS — Z515 Encounter for palliative care: Secondary | ICD-10-CM

## 2017-01-25 LAB — CBC
HCT: 30.5 % — ABNORMAL LOW (ref 39.0–52.0)
HEMOGLOBIN: 9.2 g/dL — AB (ref 13.0–17.0)
MCH: 25.9 pg — ABNORMAL LOW (ref 26.0–34.0)
MCHC: 30.2 g/dL (ref 30.0–36.0)
MCV: 85.9 fL (ref 78.0–100.0)
PLATELETS: 420 10*3/uL — AB (ref 150–400)
RBC: 3.55 MIL/uL — AB (ref 4.22–5.81)
RDW: 19.6 % — ABNORMAL HIGH (ref 11.5–15.5)
WBC: 11.6 10*3/uL — AB (ref 4.0–10.5)

## 2017-01-25 LAB — BASIC METABOLIC PANEL
ANION GAP: 6 (ref 5–15)
BUN: 37 mg/dL — ABNORMAL HIGH (ref 6–20)
CALCIUM: 9.3 mg/dL (ref 8.9–10.3)
CO2: 25 mmol/L (ref 22–32)
CREATININE: 1.26 mg/dL — AB (ref 0.61–1.24)
Chloride: 110 mmol/L (ref 101–111)
GFR, EST NON AFRICAN AMERICAN: 54 mL/min — AB (ref >60)
Glucose, Bld: 102 mg/dL — ABNORMAL HIGH (ref 65–99)
Potassium: 4.4 mmol/L (ref 3.5–5.1)
SODIUM: 141 mmol/L (ref 135–145)

## 2017-01-25 LAB — PATHOLOGIST SMEAR REVIEW

## 2017-01-25 MED ORDER — ADULT MULTIVITAMIN W/MINERALS CH
1.0000 | ORAL_TABLET | Freq: Every day | ORAL | Status: DC
  Administered 2017-01-25 – 2017-01-26 (×2): 1 via ORAL
  Filled 2017-01-25 (×2): qty 1

## 2017-01-25 MED ORDER — TAMSULOSIN HCL 0.4 MG PO CAPS
0.4000 mg | ORAL_CAPSULE | Freq: Every day | ORAL | Status: DC
  Administered 2017-01-25 – 2017-01-26 (×2): 0.4 mg via ORAL
  Filled 2017-01-25 (×2): qty 1

## 2017-01-25 MED ORDER — GLUCERNA SHAKE PO LIQD
237.0000 mL | Freq: Three times a day (TID) | ORAL | Status: DC
  Administered 2017-01-26 (×2): 237 mL via ORAL

## 2017-01-25 NOTE — Progress Notes (Addendum)
PROGRESS NOTE    Juan [name]   XGG:[id number]  DOB: Oct 03, 1940  DOA: 01/23/2017 PCP: Juan Plump, MD   Brief Narrative:  Juan [name]  76 y.o.malewith medical history significant of coronary artery disease, chronic diastolic congestive heart failure on diuretics, history of myeloproliferative disease did not follow up with oncologist, chronic anemia, chronic respiratory failure with hypoxia on 2 L of oxygen at home, gout presented with worsening right hip and flank pain. Patient is a poor historian.tells me he is here for the gout pain in his hips and back and feet. Walks "a little bit".   Admitted from 8/5- 8/9 with a complaint of right hip pain and found to have severe sclerosis of the hip, treated for myeloproliferative disorder, discharged with foley due to U retention.   8/11- Came back to ER when sent by a [facility] who though he may need more O2. Pulse ox was normal and he was sent home from the ER.   8/14- came to ER to have Foley assessed - stated he was told that he could return to the hospital for this. It was discovered that no one had picked up the Ceftin that had been prescribed for him. Urine in ER neg for UTI- sent for culture- asked to f/u with Urology.  8/22 cardiology visit with [facility]- no changes made  8/25 back to ER for pain and swelling in right foot- started on Indomethacin TID for gout and some antibiotics for cellulitis (not mentioned in ER notes)  8/26- back in ER for same right foot issue- he was not able to get prescriptions filled and took some left over antibiotics- case management asked to help with meds  8/29- back in ER for the same- apparently despite case management involvement, he was not able to get the prescriptions   Subjective: No complaints today.  ROS: no complaints of nausea, vomiting, constipation diarrhea, cough, dyspnea or dysuria. No other complaints.   Assessment & Plan:   Active Problems: Chronic hip, back and foot pain - underlying  myeloproliferative disorder - PET scan>> diffusely increased tracer localization throughout the axial and appendicular skeleton - this pattern can also be seen in patients with diffuse metastatic disease and metabolic bone disease (metabolic disease considered less likely in this patient due to absence of skull involvement). - will involve palliative care to address GOC with patient and family and help with long term pain control and repeated hospital admissions - Tylenol QID, Robaxin 750 TID, Indocin 50 mg TID, Neurontin 200 mg TID- apparently made him sleepy last night and Neurontin was stopped -  he wants to go to SNF- PT eval- he refused to get up yesterday - have told him today that he has to work with PT- when RN mobilized him, he noted that the patient did not have too much difficulty with mobility    Myeloproliferative disease  - evaluated by Dr Juan [name] last admission- will notify him tomorrow of admission - see PET scan above - bone marrow biopsy>>BONE MARROW WITH EXTENSIVE FIBROSIS AND NEW BONE FORMATION- favor,  myeloproliferative neoplasm particularly primary myelofibrosis or a myeloproliferative/myelodysplastic neoplasm with associated fibrosis - cont Feragon and transfuse as needed- have notified Dr Juan [name] of his admission  Weight loss - likely due to above MPD-  weight in 9/16 was 245 lb and now is 184 lb    Urinary retention/   Acute lower UTI? - I am not certain he has a UTI- UA can be positive in setting of foley-  d/c Rocephin for now- culture showing gram neg rods - required foley last admission- has not yet received a voiding trial - I did order Flomax for him when last discharged but he is currently not on this-  - 9/9 - failed voiding trial again- subsequently resumed Flomax - he states he has an appt with Urology but I have called the office and he does not have one - I have made an appt for him for 9/17 10:45 for follow up  Chronic grade 1 diastolic CHF (congestive  heart failure) ([place]) Pulmonary hypertension severe - most recent ECHO 8/18  - hold lasix and follow weights while in hospital  CAD - ASA  Gout - start Allopurinol again for prophylaxis  Social/disposition:  Very poor insight into medical conditions. Physician attempt to have a [place] discussion in 11/17 and patient refused to discuss it.  He has had numerous ER visits and hospitalizations this year. Difficult to find family and they are never in the hospital. He states he lives with a son who has no cell phone and is "sick". His daughter lives in [place] and has 2 jobs. Palliative consult. Likely needs to be in a long term care facility if no on get him to his appts or get him his medications.    DVT prophylaxis: heparin Code Status: Full code Family Communication:  Tried to call daughter but did not pick up and no voicemail- Tried his home number to reach the son and no on picked up. Disposition Plan:  Consultants:   oncology Procedures:    Antimicrobials:  Anti-infectives    Start     Dose/Rate Route Frequency Ordered Stop   01/24/17 2300  cefTRIAXone (ROCEPHIN) 1 g in dextrose 5 % 50 mL IVPB  Status:  Discontinued     1 g 100 mL/hr over 30 Minutes Intravenous Every 24 hours 01/24/17 0057 01/24/17 1425   01/23/17 2245  cefTRIAXone (ROCEPHIN) 1 g in dextrose 5 % 50 mL IVPB     1 g 100 mL/hr over 30 Minutes Intravenous  Once 01/23/17 2241 01/23/17 2339       Objective: Vitals:   01/24/17 1511 01/24/17 1523 01/24/17 2207 01/25/17 0500  BP: (!) 155/91 (!) 144/71 (!) 141/64 (!) 149/72  Pulse: 79 62 60 (!) 57  Resp:  _0 Temp: 98.2 F (36.8 C) 97.7 F (36.5 C) 98.2 F (36.8 C) 97.7 F (36.5 C)  TempSrc: Oral Oral Oral Oral  SpO2: 99% 100% 100% 100%    Intake/Output Summary (Last 24 hours) at 01/25/17 1136 Last data filed at 01/25/17 1025  Gross per 24 hour  Intake              220 ml  Output              450 ml  Net             -230 ml   There were no  vitals filed for this visit.  Examination: General exam: Appears comfortable  HEENT: PERRLA, oral mucosa moist, no sclera icterus or thrush Respiratory system: Clear to auscultation. Respiratory effort normal. Cardiovascular system: S1 & S2 heard,  No murmurs  Gastrointestinal system: Abdomen soft, non-tender, nondistended. Normal bowel sound. No organomegaly Central nervous system: Alert and oriented. No focal neurological deficits. Extremities: No cyanosis, clubbing or edema- tender right hip Skin: No rashes or ulcers Psychiatry:  Mood & affect appropriate.  Foley present with clear urine in bag.   Data  Reviewed: I have personally reviewed following labs and imaging studies  CBC:  Recent Labs Lab 01/23/17 1902 01/24/17 0425 01/25/17 0253  WBC 12.8* 12.9* 11.6*  NEUTROABS 8.3*  --   --   HGB 9.9* 9.8* 9.2*  HCT 33.3* 32.5* 30.5*  MCV 86.0 86.0 85.9  PLT 503* 512* 010*   Basic Metabolic Panel:  Recent Labs Lab 01/23/17 1902 01/24/17 0425 01/25/17 0253  NA 136 141 141  K 4.3 4.2 4.4  CL 106 109 110  CO2 _0 GLUCOSE 108* 95 102*  BUN 31* 32* 37*  CREATININE 1.13 1.00 1.26*  CALCIUM 9.2 9.5 9.3   GFR: CrCl cannot be calculated (Unknown ideal weight.). Liver Function Tests: No results for input(s): AST, ALT, ALKPHOS, BILITOT, PROT, ALBUMIN in the last 168 hours. No results for input(s): LIPASE, AMYLASE in the last 168 hours. No results for input(s): AMMONIA in the last 168 hours. Coagulation Profile: No results for input(s): INR, PROTIME in the last 168 hours. Cardiac Enzymes: No results for input(s): CKTOTAL, CKMB, CKMBINDEX, TROPONINI in the last 168 hours. BNP (last 3 results) No results for input(s): PROBNP in the last 8760 hours. HbA1C: No results for input(s): HGBA1C in the last 72 hours. CBG: No results for input(s): GLUCAP in the last 168 hours. Lipid Profile: No results for input(s): CHOL, HDL, LDLCALC, TRIG, CHOLHDL, LDLDIRECT in the last 72  hours. Thyroid Function Tests: No results for input(s): TSH, T4TOTAL, FREET4, T3FREE, THYROIDAB in the last 72 hours. Anemia Panel:  Recent Labs  01/24/17 0425  FERRITIN 448*  TIBC 178*  IRON 48  RETICCTPCT 2.5   Urine analysis:    Component Value Date/Time   COLORURINE AMBER (A) 01/23/2017 2120   APPEARANCEUR CLOUDY (A) 01/23/2017 2120   LABSPEC 1.012 01/23/2017 2120   PHURINE 5.0 01/23/2017 2120   GLUCOSEU NEGATIVE 01/23/2017 2120   GLUCOSEU NEGATIVE 05/02/2015 1008   HGBUR SMALL (A) 01/23/2017 2120   [place] NEGATIVE 01/23/2017 2120   KETONESUR NEGATIVE 01/23/2017 2120   PROTEINUR >=300 (A) 01/23/2017 2120   UROBILINOGEN 0.2 05/02/2015 1008   NITRITE NEGATIVE 01/23/2017 2120   LEUKOCYTESUR LARGE (A) 01/23/2017 2120   Sepsis Labs: _1 (procalcitonin:4,lacticidven:4) ) Recent Results (from the past 240 hour(s))  Urine culture     Status: Abnormal (Preliminary result)   Collection Time: 01/23/17  9:20 PM  Result Value Ref Range Status   Specimen Description URINE, RANDOM  Final   Special Requests NONE  Final   Culture >=100,000 COLONIES/mL GRAM NEGATIVE RODS (A)  Final   Report Status PENDING  Incomplete         Radiology Studies: Dg Chest 2 View  Result Date: 01/23/2017 CLINICAL DATA:  Weakness, shortness of breath, not feeling well, history hypertension, coronary artery disease, type II diabetes mellitus, myeloproliferative disorder, CHF EXAM: CHEST  2 VIEW COMPARISON:  12/26/2016 FINDINGS: Enlargement of cardiac silhouette. Mediastinal contours normal. Chronic accentuation of interstitial markings likely mild pulmonary edema. No pleural effusion or pneumothorax. Diffuse osseous sclerosis consistent with history of myeloproliferative disorder. IMPRESSION: Probable mild pulmonary edema. Electronically Signed   By: Lavonia Dana M.D.   On: 01/23/2017 19:26      Scheduled Meds: . acetaminophen  650 mg Oral QID   Or  . acetaminophen  650 mg Rectal QID    . allopurinol  100 mg Oral Daily  . aspirin  325 mg Oral Daily  . diclofenac sodium  4 g Topical QID  . feeding supplement ([place])  118.5 mL Oral Daily  . ferrous gluconate  324 mg Oral Daily  . fluticasone  2 spray Each Nare Daily  . heparin  5,000 Units Subcutaneous Q8H  . hydrocerin   Topical BID  . indomethacin  50 mg Oral TID WC  . methocarbamol  750 mg Oral TID   Continuous Infusions:    LOS: 2 days    Time spent in minutes: 45 min taken to see patient, called and speak with Hematology and obtain appt from Urology    Debbe Odea, MD [facility] Pager: [email or web address] Password [email or web address] 01/25/2017, 11:36 AM

## 2017-01-25 NOTE — Consult Note (Signed)
Consultation Note Date: 01/25/2017   Patient Name: Juan [name]  DOB: 09/27/1940  MRN: [id number]  Age / Sex: 76 y.o., male  PCP: Colon Branch, MD Referring Physician: Debbe Odea, MD  Reason for Consultation: Establishing goals of care  HPI/Patient Profile: 76 y.o. male  with past medical history of myeloproliferative disorder, migraines, anemia, AAA, CAD, diastolic CHF, thrombocytosis, HTN, OSA, diabetes mellitus type II admitted on 01/23/2017 with back and flank pain. Palliative care requested to assist with [place] d/t worsening status evidenced by frequent hospital admissions and diffuse pain.   Clinical Assessment and Goals of Care: I met today at bedside with Juan [name], no family present. We discussed his worsening health and myeloproliferative disease and concern that his pain is related to worsening disease process. Juan [name] continues to have poor understanding of his medical issues and expectations. He has been seen multiple times by palliative care in the past. He trusts Dr. Marin Olp and his opinion and recommendations and plans to follow up with him but he is limited by finances and transport. I wonder if he would be eligible for Medicaid for additional assistance and resources??  Juan [name] today tells [place] that his pain is better controlled and he is currently not hurting. Per RN and notes his gabapentin was making him too lethargic and had to be decreased. This has been the problem with controlling his pain and I am concerned that his pain will be limiting to his functional status even if he is currently comfortable.   We did discuss goals with Juan [name]. He does tell us that he is not interested in aggressive treatment and desires QOL but also is hopeful for quantity of life. He is very spiritual and says that he is ready when God takes me. We discussed this in terms of resuscitation and seems inclined towards NO  resuscitation but then says he would like to discuss this with his children when he gets home (although he fluctuates about allowing sharing information or discussion with his children historically). He agrees to outpatient palliative consult and follow up.    Primary Decision Maker PATIENT - although he has poor understanding of his health condition    SUMMARY OF RECOMMENDATIONS   - Will need outpatient palliative to follow - Juan [name] has poor expectations about "getting better" and few resources to properly manage his health conditions  Code Status/Advance Care Planning:  Full code   Symptom Management:   Hip/joint pain r/t bone mets?: Consider decadron daily 8 mg po trial and this could help with the drowsiness from other pain medications. Consider scheduled Tylenol 1000 TID. Continue gabapentin 200 mg TID prn.   Palliative Prophylaxis:   Bowel Regimen, Delirium Protocol, Frequent Pain Assessment and Palliative Wound Care  Additional Recommendations (Limitations, Scope, Preferences):  Full Scope Treatment  Psycho-social/Spiritual:   Desire for further Chaplaincy support:no  Additional Recommendations: Caregiving  Support/Resources  Prognosis:   Very poor with frequent hospital admissions and declining functional status. Poor follow through and fluctuating goals of care.  Discharge Planning: To Be Determined. Will need outpatient palliative follow up.      Primary Diagnoses: Present on Admission: . (Resolved) Fluid overload . (Resolved) ESRD (end stage renal disease) ([place]) . Chronic diastolic CHF (congestive heart failure) ([place]) . UTI (urinary tract infection) . Acute lower UTI . Urinary retention . Acute kidney injury superimposed on CKD ([place]) . (Resolved) Cardiomegaly . CKD (chronic kidney disease) stage 3, GFR 30-59 ml/min . Gout . Myeloproliferative disease ([place]) . Pulmonary hypertension severe   I have reviewed the medical record, interviewed the  patient and family, and examined the patient. The following aspects are pertinent.  Past Medical History:  Diagnosis Date  . Allergic rhinitis   . Anemia   . Ascending aortic aneurysm ([place]) 03/2014   4.3cm on CT scan  . CAD (coronary artery disease)    dx elsewheer in past, no documentation. Non-ischemic myoview 2007  . Chronic diastolic CHF (congestive heart failure), NYHA class 2 ([facility])    Normal EF w/ grade 1 dd by echo 12/2015  . Edema    R>L leg, u/s 5-12 neg for DVT  . Hemorrhoid   . History of thrombocytosis   . Hypertension   . Migraine    "once/wk at least" (07/11/2013)  . Myeloproliferative disease ([place])   . Shortness of breath   . Sinus congestion   . Sleep apnea, obstructive    at some point used CPAP, was d/c  years ago  . Type II diabetes mellitus ([place])    Social History   Social History  . Marital status: Widowed    Spouse name: N/A  . Number of children: 2  . Years of education: N/A   Occupational History  . retired    .  Retired   Social History Main Topics  . Smoking status: Former Smoker    Packs/day: 0.25    Years: 12.00    Types: Cigarettes    Quit date: 05/18/1966  . Smokeless tobacco: Never Used     Comment: quit smoking 45 years ago  . Alcohol use No  . Drug use: No  . Sexual activity: Yes   Other Topics Concern  . None   Social History Narrative   Moved from [place] 2006   Widow 2007   Son lives with him    Family History  Problem Relation Age of Onset  . Schizophrenia Son   . Colon cancer Neg Hx   . Prostate cancer Neg Hx   . Heart attack Neg Hx   . Diabetes Neg Hx    Scheduled Meds: . acetaminophen  650 mg Oral QID   Or  . acetaminophen  650 mg Rectal QID  . allopurinol  100 mg Oral Daily  . aspirin  325 mg Oral Daily  . diclofenac sodium  4 g Topical QID  . feeding supplement (GLUCERNA SHAKE)  118.5 mL Oral Daily  . ferrous gluconate  324 mg Oral Daily  . fluticasone  2 spray Each Nare Daily  . heparin  5,000 Units  Subcutaneous Q8H  . hydrocerin   Topical BID  . indomethacin  50 mg Oral TID WC  . methocarbamol  750 mg Oral TID  . tamsulosin  0.4 mg Oral Daily   Continuous Infusions: PRN Meds:.gabapentin, hydrOXYzine, nitroGLYCERIN, ondansetron **OR** ondansetron (ZOFRAN) IV, polyethylene glycol No Known Allergies Review of Systems  Constitutional: Positive for activity change, appetite change and fatigue.  Musculoskeletal:       Hip pain    Physical  Exam  Constitutional: He is oriented to person, place, and time. He appears well-developed.  HENT:  Head: Normocephalic and atraumatic.  Cardiovascular: Normal rate and regular rhythm.   Pulmonary/Chest: Effort normal. No accessory muscle usage. No tachypnea. No respiratory distress.  Abdominal: Normal appearance.  Neurological: He is alert and oriented to person, place, and time.  Nursing note and vitals reviewed.   Vital Signs: BP (!) 139/58 (BP Location: Right Arm)   Pulse (!) 58   Temp 98.3 F (36.8 C) (Oral)   Resp 16   SpO2 100%  Pain Assessment: 0-10   Pain Score: 0-No pain   SpO2: SpO2: 100 % O2 Device:SpO2: 100 % O2 Flow Rate: .O2 Flow Rate (L/min): 2 L/min  IO: Intake/output summary:  Intake/Output Summary (Last 24 hours) at 01/25/17 1515 Last data filed at 01/25/17 1025  Gross per 24 hour  Intake              220 ml  Output              450 ml  Net             -230 ml    LBM: Last BM Date: 01/23/17 Baseline Weight:   Most recent weight:       Palliative Assessment/Data: 30%    Time Total: 50 min  Greater than 50%  of this time was spent counseling and coordinating care related to the above assessment and plan.  Signed by: Vinie Sill, NP Palliative Medicine Team Pager # [phone] (M-F 8a-5p) Team Phone # [phone] (Nights/Weekends)

## 2017-01-25 NOTE — Progress Notes (Signed)
CSW received SNF consult. Patient used 20 Medicare days in July and does not want to pay for copays. Patient may be eligible for home first program through [facility] (patient refused them last admission). Otherwise, will dc with hh.  Cedric Fishman LCSWA [phone]

## 2017-01-25 NOTE — Evaluation (Signed)
Physical Therapy Evaluation Patient Details Name: Juan [name] MRN: [id number] DOB: 03-05-1941 Today's Date: 01/25/2017   History of Present Illness  [name]  76 y.o. male with medical history significant of coronary artery disease, chronic diastolic congestive heart failure on diuretics, history of myeloproliferative disease did not follow up with oncologist, chronic anemia, chronic respiratory failure with hypoxia on 2 L of oxygen at home, gout presented with worsening right hip and flank pain.  Clinical Impression  Patient presents with decreased mobility due to weakness, decreased balance, decreased activity tolerance and decreased safety awareness.  Feel he will benefit from skilled PT in the acute setting to allow return home following SNF level rehab stay.     Follow Up Recommendations SNF    Equipment Recommendations  None recommended by PT    Recommendations for Other Services       Precautions / Restrictions Precautions Precautions: Fall Precaution Comments: oxygen dependent      Mobility  Bed Mobility Overal bed mobility: Needs Assistance Bed Mobility: Rolling;Sidelying to Sit;Sit to Supine Rolling: Supervision Sidelying to sit: Supervision;HOB elevated   Sit to supine: HOB elevated;Mod assist   General bed mobility comments: pt with increased time and heavily reliant on UE's and railings and requested to elevate HOB when coming upright from sidelying, assist to scoot up in bed once in bed with assist and bed in trendelenberg  Transfers Overall transfer level: Needs assistance Equipment used: Rolling walker (2 wheeled) Transfers: Sit to/from Stand Sit to Stand: Min assist;From elevated surface         General transfer comment: assist to stand and elevated height of bed  Ambulation/Gait Ambulation/Gait assistance: Min guard;Supervision Ambulation Distance (Feet): 60 Feet Assistive device: Rolling walker (2 wheeled) Gait Pattern/deviations: Step-through  pattern;Decreased stride length;Trunk flexed     General Gait Details: heavy UE support on walker, encouraged to walk in hallway some as pt reports only has been walking to door, S for safety and increased time needed  Stairs            Wheelchair Mobility    Modified Rankin (Stroke Patients Only)       Balance Overall balance assessment: Needs assistance Sitting-balance support: Feet supported Sitting balance-Leahy Scale: Fair     Standing balance support: Bilateral upper extremity supported Standing balance-Leahy Scale: Poor Standing balance comment: trunk flexed and heavy UE support needed                             Pertinent Vitals/Pain Pain Assessment: 0-10 Pain Score: 9  Pain Location: Left side, back on right side,  Pain Descriptors / Indicators: Aching Pain Intervention(s): Monitored during session;Repositioned    Home Living Family/patient expects to be discharged to:: Private residence Living Arrangements: Children (son who is sick) Available Help at Discharge: Family;Available 24 hours/day Type of Home: Apartment Home Access: Level entry     Home Layout: One level Home Equipment: Walker - 2 wheels;Cane - single point;Shower seat;Bedside commode;Wheelchair - manual Additional Comments: pt lives with his son    Prior Function Level of Independence: Needs assistance      ADL's / Homemaking Assistance Needed: son gets groceries and gets pt set up from bath and takes to doctors appointment        Hand Dominance   Dominant Hand: Right    Extremity/Trunk Assessment   Upper Extremity Assessment Upper Extremity Assessment: Generalized weakness    Lower Extremity Assessment Lower Extremity Assessment: Generalized  weakness    Cervical / Trunk Assessment Cervical / Trunk Assessment: Kyphotic;Other exceptions Cervical / Trunk Exceptions: fwd head, generally weak in trunk  Communication   Communication: No difficulties  Cognition  Arousal/Alertness: Awake/alert Behavior During Therapy: WFL for tasks assessed/performed Overall Cognitive Status: Within Functional Limits for tasks assessed                                        General Comments General comments (skin integrity, edema, etc.): patient with obvious muscle wasting and lots of excess skin    Exercises     Assessment/Plan    PT Assessment Patient needs continued PT services  PT Problem List Decreased strength;Decreased mobility;Decreased activity tolerance;Decreased balance;Decreased knowledge of use of DME;Decreased knowledge of precautions       PT Treatment Interventions DME instruction;Therapeutic activities;Patient/family education;Therapeutic exercise;Gait training;Balance training;Functional mobility training    PT Goals (Current goals can be found in the Care Plan section)  Acute Rehab PT Goals Patient Stated Goal: To go to rehab PT Goal Formulation: With patient Time For Goal Achievement: 02/08/17 Potential to Achieve Goals: Fair    Frequency Min 3X/week   Barriers to discharge        Co-evaluation               AM-PAC PT "6 Clicks" Daily Activity  Outcome Measure Difficulty turning over in bed (including adjusting bedclothes, sheets and blankets)?: A Lot Difficulty moving from lying on back to sitting on the side of the bed? : A Lot Difficulty sitting down on and standing up from a chair with arms (e.g., wheelchair, bedside commode, etc,.)?: Unable Help needed moving to and from a bed to chair (including a wheelchair)?: A Little Help needed walking in hospital room?: A Little Help needed climbing 3-5 steps with a railing? : A Lot 6 Click Score: 13    End of Session Equipment Utilized During Treatment: Gait belt Activity Tolerance: Patient limited by fatigue Patient left: in bed;with call Kelly/phone within reach Nurse Communication: Mobility status PT Visit Diagnosis: Other abnormalities of gait and  mobility (R26.89);Muscle weakness (generalized) (M62.81)    Time: 3845-3646 PT Time Calculation (min) (ACUTE ONLY): 23 min   Charges:   PT Evaluation $PT Eval Moderate Complexity: 1 Mod PT Treatments $Gait Training: 8-22 mins   PT G CodesMagda Kiel, [place] [phone] 01/25/2017   Reginia Naas 01/25/2017, 12:20 PM

## 2017-01-25 NOTE — Progress Notes (Signed)
   01/25/17 0900  Clinical Encounter Type  Visited With Patient  Visit Type Spiritual support;Initial  Referral From Physician  Spiritual Encounters  Spiritual Needs Prayer;Sacred text  Stress Factors  Patient Stress Factors Family relationships  Family Stress Factors (concerned that son could have people over -bad influence)  Advance Directives (For Healthcare)  Does Patient Have a Medical Advance Directive? No  Would patient like information on creating a medical advance directive? No - Patient declined  [place]  Does Patient Have a Mental Health Advance Directive? No  Would patient like information on creating a mental health advance directive? No - Patient declined  Patient requested scripture and prayer.   Patient shared what was on his mind and needed support and encouragement for the things going through his mind. Concerned about son he promised his wife (deceased) he would care for. Son has turned life around for good, yet patient hopes son will not have friends over who will be poor influence.    Read Psalm 121 and Matthew 11:28-31. Had prayer. Patient requests chaplain to come again on Wednesday. Estrella Deeds, Chaplain

## 2017-01-25 NOTE — Progress Notes (Signed)
Initial Nutrition Assessment  DOCUMENTATION CODES:   Not applicable  INTERVENTION:   -Increase Glucerna Shake po to TID, each supplement provides 220 kcal and 10 grams of protein  NUTRITION DIAGNOSIS:   Increased nutrient needs related to chronic illness as evidenced by estimated needs.  GOAL:   Patient will meet greater than or equal to 90% of their needs  MONITOR:   PO intake, Supplement acceptance, Diet advancement, Labs, Weight trends, I & O's  REASON FOR ASSESSMENT:   Malnutrition Screening Tool    ASSESSMENT:   Juan [name]  76 y.o. male with medical history significant of coronary artery disease, chronic diastolic congestive heart failure on diuretics, history of myeloproliferative disease did not follow up with oncologist, chronic anemia, chronic respiratory failure with hypoxia on 2 L of oxygen at home, gout presented with worsening right hip and flank pain. Patient is a poor historian. tells me he is here for the gout pain in his hips and back and feet. Walks "a little bit".   Pt admitted with chronic hip, back, and foot pain.   Spoke with pt, who reports great appetite PTA. Pt consumes 3 meals per day- Breakfast: bacon and eggs, Lunch: cheeseburger, Dinner: meat, starch, vegetable. Pt reports consuming 100% of breakfast this morning.   Pt shares UBW is around 200#. He estimates he has lost approximately 15# over the past 3 weeks, however, this is not consistent with wt hx.   Nutrition-Focused physical exam completed. Findings are mild to moderate fat depletion, mild to moderate muscle depletion, and no edema. Pt reports decreased mobility, but able to get around his house with assistive devices.   Labs reviewed.   Diet Order:  Diet Heart Room service appropriate? Yes; Fluid consistency: Thin  Skin:  Wound (see comment) (st II rt/lt ear)  Last BM:  01/23/17  Height:   Ht Readings from Last 1 Encounters:  01/25/17 6\' 3"  (1.905 m)    Weight:   Wt Readings  from Last 1 Encounters:  01/25/17 184 lb (83.5 kg)    Ideal Body Weight:  89.1 kg  BMI:  Body mass index is 23 kg/m.  Estimated Nutritional Needs:   Kcal:  2000-2200  Protein:  105-120 grams  Fluid:  > 2.0 L  EDUCATION NEEDS:   Education needs addressed  Caitlynne Harbeck A. Jimmye Norman, RD, LDN, CDE Pager: [phone] After hours Pager: [phone]

## 2017-01-26 DIAGNOSIS — N390 Urinary tract infection, site not specified: Secondary | ICD-10-CM

## 2017-01-26 DIAGNOSIS — N179 Acute kidney failure, unspecified: Secondary | ICD-10-CM

## 2017-01-26 DIAGNOSIS — D7581 Myelofibrosis: Secondary | ICD-10-CM

## 2017-01-26 DIAGNOSIS — N189 Chronic kidney disease, unspecified: Secondary | ICD-10-CM

## 2017-01-26 DIAGNOSIS — N186 End stage renal disease: Secondary | ICD-10-CM

## 2017-01-26 DIAGNOSIS — T83511A Infection and inflammatory reaction due to indwelling urethral catheter, initial encounter: Principal | ICD-10-CM

## 2017-01-26 MED ORDER — DARBEPOETIN ALFA 300 MCG/0.6ML IJ SOSY
300.0000 ug | PREFILLED_SYRINGE | Freq: Once | INTRAMUSCULAR | Status: AC
  Administered 2017-01-26: 300 ug via SUBCUTANEOUS
  Filled 2017-01-26: qty 0.6

## 2017-01-26 MED ORDER — CIPROFLOXACIN HCL 500 MG PO TABS: 500.0000 mg | ORAL_TABLET | Freq: Two times a day (BID) | ORAL | 0 refills | Status: AC

## 2017-01-26 MED ORDER — ACETAMINOPHEN 325 MG PO TABS: 650.0000 mg | ORAL_TABLET | Freq: Four times a day (QID) | ORAL | Status: DC

## 2017-01-26 MED ORDER — ALLOPURINOL 100 MG PO TABS: 100.0000 mg | ORAL_TABLET | Freq: Every day | ORAL | 3 refills | Status: DC

## 2017-01-26 MED ORDER — TAMSULOSIN HCL 0.4 MG PO CAPS: 0.4000 mg | ORAL_CAPSULE | Freq: Every day | ORAL | 0 refills | Status: DC

## 2017-01-26 MED ORDER — POLYETHYLENE GLYCOL 3350 17 G PO PACK: 17.0000 g | PACK | Freq: Every day | ORAL | 0 refills | Status: DC | PRN

## 2017-01-26 MED ORDER — DICLOFENAC SODIUM 1 % TD GEL: 4.0000 g | Freq: Four times a day (QID) | TRANSDERMAL | 0 refills | Status: DC

## 2017-01-26 MED ORDER — METHOCARBAMOL 750 MG PO TABS: 750.0000 mg | ORAL_TABLET | Freq: Three times a day (TID) | ORAL | 0 refills | Status: DC

## 2017-01-26 MED ORDER — LEVOFLOXACIN IN D5W 500 MG/100ML IV SOLN
500.0000 mg | INTRAVENOUS | Status: DC
  Administered 2017-01-26: 500 mg via INTRAVENOUS
  Filled 2017-01-26 (×2): qty 100

## 2017-01-26 NOTE — Care Management Note (Signed)
Case Management Note  Patient Details  Name: Juan [name] MRN: [id number] Date of Birth: [birth year]  Subjective/Objective:   Pt with medical history significant of coronary artery disease, chronic diastolic congestive heart failure on diuretics, history of myeloproliferative disease did not follow up with oncologist, chronic anemia, chronic respiratory failure with hypoxia on 2 L of oxygen at home, gout.    Action/Plan: Per PT's recommendation : SNF. Pt has refused SNF placement. Plan is to d/c to home with home health services. Pt will need PTAR @ [phone] for transportation to home.  Expected Discharge Date:  01/26/17               Expected Discharge Plan:  [place]  In-House Referral:  Clinical Social Work, [facility]  Discharge planning Services  CM Consult  Post Acute Care Choice:    Choice offered to:  Patient  DME Arranged:    DME Agency:    HH Arranged:  RN, Nurse's Aide, PT, OT, Social Work.  [place] Agency:  [facility]. Pt deemed [place] per Medical Advisor. Referral made with [facility] @ [phone]. Meredeth Ide  to f/u with palliative care referral once d/c.  Status of Service:  Completed, signed off  If discussed at [place] of Stay Meetings, dates discussed:    Additional Comments:  Sharin Mons, RN 01/26/2017, 2:26 PM

## 2017-01-26 NOTE — Consult Note (Signed)
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 Back Pain  : I have been hurting.  HPI: [name] is well-known to me. He is a 76 year old African-American male. We have known him for a couple years.  He has had this myeloproliferative disorder. He would never let us do any evaluation for. Recently, he had a change of heart. He a lot a bone marrow biopsy to be done. This was done on August 8. The bone marrow report ([id number]) showed bone marrow with extensive fibrosis and new bone formation. 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 The cytogenetics were normal.  He has not made it back to her office because of transportation and financial issues.  He was subsequently admitted to [facility] hospital. He was admitted on September 8. He has a Foley catheter in. Apparently, he has had problems with urinary retention. I'm not sure if he is seeing the urologist.  His urine culture is growing gram-negative rods. I do not see that he is on antibiotics right now. I will go ahead and put him on some IV Levaquin.  On admission, his labs show that his white cell count of 12.8. Hemoglobin 9.9. Platelet count 503,000. His creatinine was 1.13. BUN 31.  He had iron studies done. His ferritin was 448 with an iron saturation of 27%.  He has an incredibly low erythropoietin level. On August 7, his erythropoietin Was only 5.8.  As such, he should respond to ESA .  Today, labs are not back. His hemoglobin yesterday was 9.2.  His corrected reticulocyte count is less than 1.5%.  He sees be eating okay. His no nausea or vomiting.  He's had no obvious bleeding.  Overall, his performance status is ECOG 2.    Past Medical History:  Diagnosis Date  . Allergic rhinitis   . Anemia   . Ascending aortic aneurysm ([place]) 03/2014   4.3cm on CT scan  . CAD (coronary artery disease)    dx elsewheer in past, no documentation. Non-ischemic myoview 2007  .  Chronic diastolic CHF (congestive heart failure), NYHA class 2 (HCC)    Normal EF w/ grade 1 dd by echo 12/2015  . Edema    R>L leg, u/s 5-12 neg for DVT  . Hemorrhoid   . History of thrombocytosis   . Hypertension   . Migraine    "once/wk at least" (07/11/2013)  . Myeloproliferative disease ([place])   . Shortness of breath   . Sinus congestion   . Sleep apnea, obstructive    at some point used CPAP, was d/c  years ago  . Type II diabetes mellitus ([place])   :  Past Surgical History:  Procedure Laterality Date  . TOE SURGERY Right    "tried to straighten out big toe" (07/11/2013)  :   Current Facility-Administered Medications:  .  acetaminophen (TYLENOL) tablet 650 mg, 650 mg, Oral, QID, 650 mg at 01/25/17 2326 **OR** acetaminophen (TYLENOL) suppository 650 mg, 650 mg, Rectal, QID, Rizwan, Saima, MD .  allopurinol (ZYLOPRIM) tablet 100 mg, 100 mg, Oral, Daily, Rizwan, Saima, MD, 100 mg at 01/25/17 1031 .  aspirin tablet 325 mg, 325 mg, Oral, Daily, Crosley, Debby, MD, 325 mg at 01/25/17 1031 .  diclofenac sodium (VOLTAREN) 1 % transdermal gel 4 g, 4 g, Topical, QID, Rizwan, Saima, MD, 4 g at 01/25/17 1837 .  feeding supplement (GLUCERNA SHAKE) (GLUCERNA SHAKE) liquid 237 mL, 237 mL, Oral, TID BM,  Debbe Odea, MD .  ferrous gluconate [facility]) tablet 324 mg, 324 mg, Oral, Daily, Crosley, Debby, MD, 324 mg at 01/25/17 1031 .  fluticasone (FLONASE) 50 MCG/ACT nasal spray 2 spray, 2 spray, Each Nare, Daily, Crosley, Debby, MD, 2 spray at 01/25/17 1033 .  gabapentin (NEURONTIN) capsule 200 mg, 200 mg, Oral, TID PRN, Crosley, Debby, MD, 200 mg at 01/26/17 0654 .  heparin injection 5,000 Units, 5,000 Units, Subcutaneous, Q8H, Crosley, Debby, MD, 5,000 Units at 01/26/17 0654 .  hydrocerin (EUCERIN) cream, , Topical, BID, Colbert Ewing, MD .  hydrOXYzine (ATARAX/VISTARIL) tablet 10 mg, 10 mg, Oral, Q4H PRN, Crosley, Debby, MD, 10 mg at 01/24/17 1011 .  indomethacin (INDOCIN) capsule 50 mg, 50  mg, Oral, TID WC, Crosley, Debby, MD, 50 mg at 01/25/17 1837 .  methocarbamol (ROBAXIN) tablet 750 mg, 750 mg, Oral, TID, Crosley, Debby, MD, 750 mg at 01/25/17 2327 .  multivitamin with minerals tablet 1 tablet, 1 tablet, Oral, Daily, Debbe Odea, MD, 1 tablet at 01/25/17 1841 .  nitroGLYCERIN (NITROSTAT) SL tablet 0.4 mg, 0.4 mg, Sublingual, Q5 min PRN, Crosley, Debby, MD .  ondansetron (ZOFRAN) tablet 4 mg, 4 mg, Oral, Q6H PRN **OR** ondansetron (ZOFRAN) injection 4 mg, 4 mg, Intravenous, Q6H PRN, Crosley, Debby, MD .  polyethylene glycol (MIRALAX / GLYCOLAX) packet 17 g, 17 g, Oral, Daily PRN, Crosley, Debby, MD .  tamsulosin (FLOMAX) capsule 0.4 mg, 0.4 mg, Oral, Daily, Rizwan, Saima, MD, 0.4 mg at 01/25/17 1510:  . acetaminophen  650 mg Oral QID   Or  . acetaminophen  650 mg Rectal QID  . allopurinol  100 mg Oral Daily  . aspirin  325 mg Oral Daily  . diclofenac sodium  4 g Topical QID  . feeding supplement (GLUCERNA SHAKE)  237 mL Oral TID BM  . ferrous gluconate  324 mg Oral Daily  . fluticasone  2 spray Each Nare Daily  . heparin  5,000 Units Subcutaneous Q8H  . hydrocerin   Topical BID  . indomethacin  50 mg Oral TID WC  . methocarbamol  750 mg Oral TID  . multivitamin with minerals  1 tablet Oral Daily  . tamsulosin  0.4 mg Oral Daily  :  No Known Allergies:  Family History  Problem Relation Age of Onset  . Schizophrenia Son   . Colon cancer Neg Hx   . Prostate cancer Neg Hx   . Heart attack Neg Hx   . Diabetes Neg Hx   :  Social History   Social History  . Marital status: Widowed    Spouse name: N/A  . Number of children: 2  . Years of education: N/A   Occupational History  . retired    .  Retired   Social History Main Topics  . Smoking status: Former Smoker    Packs/day: 0.25    Years: 12.00    Types: Cigarettes    Quit date: 05/18/1966  . Smokeless tobacco: Never Used     Comment: quit smoking 45 years ago  . Alcohol use No  . Drug use: No  .  Sexual activity: Yes   Other Topics Concern  . Not on file   Social History Narrative   Moved from [place] 2006   Widow 2007   Son lives with him   :  Pertinent items are noted in HPI.  Exam: Patient Vitals for the past 24 hrs:  BP Temp Temp src Pulse Resp SpO2 Height Weight  01/26/17 0501 Marland Kitchen)  142/65 98.1 F (36.7 C) Oral (!) 59 16 99 % - -  01/25/17 2122 (!) 151/73 98.2 F (36.8 C) Oral 66 18 99 % - -  01/25/17 1703 - - - - - - _0  (1.905 m) 184 lb (83.5 kg)  01/25/17 1431 (!) 139/58 98.3 F (36.8 C) Oral (!) 58 16 100 % - -    As above    Recent Labs  01/24/17 0425 01/25/17 0253  WBC 12.9* 11.6*  HGB 9.8* 9.2*  HCT 32.5* 30.5*  PLT 512* 420*    Recent Labs  01/24/17 0425 01/25/17 0253  NA 141 141  K 4.2 4.4  CL 109 110  CO2 24 25  GLUCOSE 95 102*  BUN 32* 37*  CREATININE 1.00 1.26*  CALCIUM 9.5 9.3    Blood smear review:  None  Pathology: None     Assessment and Plan:  [name] is a 76 year old African-American male. He has myelofibrosis. He has very extensive bone marrow fibrosis.  At this point, I think that the best chance that we have to help him is administering [name]. Again, his erythropoietin level is very low. I would think that he should respond to [name].  I talked him about this. I don't see any contraindication to giving him [name]. I went over side effects. He understands these. He would like to give [name] and try. I told that if we can just keep his hemoglobin above 10, then I think he would feel better.  We have to watch his iron studies. The iron studies that were done recently looked pretty good.  As always, he is very pleasant.  We will follow along and try to help as much as possible.  Lattie Haw, MD  Psalm 17:15

## 2017-01-26 NOTE — Consult Note (Signed)
   [facility] CM Inpatient Consult   01/26/2017  [name] 1941/02/01 [id number]  Patient was previously active with [facility] on the high risk list for re-admissions in the Medicare ACO.  Patient has been engaged by a Administrator. Met with the patient at the bedside.  Patient tells this Probation officer that he needs "to go to a facility where he can get some help and get stronger. I am waiting on that girl [CSW] to let me know about going to a facility."  Spoke with inpatient CSW, Percell Locus, she states that the patient is refusing to pay the co-pays for the skilled facility. Dr. Regan Lemming, [place], inpatient [facility], came to speak with the patient at the bedside.  Patient continues to be conflicted about allowing services or applying for Medicaid to assist him in getting the services needed.  Patient continues to have issues with food resources and obtaining his medications. Patient states, "I don't want nobody in my business, I have my son, who is there with me and he can barely live on what he has. He rides the bus. My daughter is working two jobs just to make her needs for her self."  Patient states, "I will keep coming back if I am sick and keep doing that until I get better."  Chart review reveals he has agreed to to some Palliative follow up as well per Palliative Care consult.  Spoke with team about post hospital needs, patient currently agreeable to discharge plan.  Please defer to inpatient RNCM, Levada Dy, notes for discharge plan.   For additional questions or referrals please contact:  [name], RN BSN [facility] Liaison  [phone] business mobile phone Toll free office [phone]

## 2017-01-26 NOTE — Progress Notes (Signed)
Pt being discharged home via [facility]. Pt alert and oriented x4. VSS. Pt c/o no pain at this time. No signs of respiratory distress. Education complete and care plans resolved. IV removed with catheter intact and pt tolerated well. Pt d/c with foley catheter for urinary retention. No further issues at this time. Pt to follow up with PCP and urology. Leanne Chang, RN

## 2017-01-26 NOTE — Discharge Instructions (Signed)
1. Please make sure you have all of your medications and take them as I have ordered and see your family doctor for routine matters.   2. I have started Ciprofloxacin for a UTI.   3. I have made you an appt for Urology for 9/17 10:45. Please do not miss this. Keep your foley for now.  4. Flomax will help you urinate better. Do not miss this.   5. Do not miss Allopurinol as it will prevent Gout.   6. For pain, we have been giving you Tylenol 650 mg every 6 hrs, Robaxin 750 mg 3 x day, Indocin 50 mg 3 x day and Voltaren gel to knees 4 x day. Please continue to do this at home.   Please take all your medications with you for your next visit with your Primary MD. Please request your Primary MD to go over all hospital test results at the follow up. Please ask your Primary MD to get all Hospital records sent to his/her office.  If you experience worsening of your admission symptoms, develop shortness of breath, chest pain, suicidal or homicidal thoughts or a life threatening emergency, you must seek medical attention immediately by calling 911 or calling your MD.  Dennis Bast must read the complete instructions/literature along with all the possible adverse reactions/side effects for all the medicines you take including new medications that have been prescribed to you. Take new medicines after you have completely understood and accpet all the possible adverse reactions/side effects.   Do not drive when taking pain medications or sedatives.    Do not take more than prescribed Pain, Sleep and Anxiety Medications  If you have smoked or chewed Tobacco in the last 2 yrs please stop. Stop any regular alcohol and or recreational drug use.  Wear Seat belts while driving.

## 2017-01-26 NOTE — Discharge Summary (Addendum)
Physician Discharge Summary  Clark Cuff LUD:[id number] DOB: 1940-08-30 DOA: 01/23/2017  PCP: Wanda Plump, MD  Admit date: 01/23/2017 Discharge date: 01/26/2017  Admitted From: home  Disposition:  home   Recommendations for Outpatient Follow-up:  1. F/u on foley and determine if it can be removed- must see Urologist on 17th (he was telling the RN that he was going to all and cancel it) 2. PCP to follow up on palliative care discussion please and help limit repeated admissions 3. Seen by palliative care here- has not determined if he wants to change code status  4.  Palliative care will follow patient at home along with [facility] and [facility] to prevent recurrent visits to the ER NOTE: I called but have been unable to reach his family to discuss his repeated visits to the ER and how to prevent recurrent visits.   Home Health:  Home first program ordered   Discharge Condition:  stable   CODE STATUS:  Full code   Consultations:  oncology    Discharge Diagnoses:  Principal Problem:   Acute lower UTI- Pseudomonas Active Problems:   Acute kidney injury superimposed on CKD (HCC)   Urinary retention   Diffuse pain   Myeloproliferative disease (HCC)   Chronic diastolic CHF (congestive heart failure) (HCC)   CKD (chronic kidney disease) stage 3, GFR 30-59 ml/min   Gout   Pulmonary hypertension severe    Subjective: No pain today. No other complaints. Ate well today.   Brief Summary: Juan [name]  76 y.o.malewith medical history significant of coronary artery disease, chronic diastolic congestive heart failure on diuretics, history of myeloproliferative disease did not follow up with oncologist, chronic anemia, chronic respiratory failure with hypoxia on 2 L of oxygen at home, gout presented with worsening right hip and flank pain. Patient is a poor historian.tells me he is here for the gout pain in his hips and back and feet. Walks "a little bit".   Admitted from 8/5- 8/9 with a  complaint of right hip pain and found to have severe sclerosis of the hip, treated for myeloproliferative disorder, discharged with foley due to U retention.   8/11- Came back to ER when sent by a [facility] who though he may need more O2. Pulse ox was normal and he was sent home from the ER.   8/14- came to ER to have Foley assessed - stated he was told that he could return to the hospital for this. It was discovered that no one had picked up the Ceftin that had been prescribed for him. Urine in ER neg for UTI- sent for culture- asked to f/u with Urology.  8/22 cardiology visit with [facility]- no changes made  8/25 back to ER for pain and swelling in right foot- started on Indomethacin TID for gout and some antibiotics for cellulitis (not mentioned in ER notes)  8/26- back in ER for same right foot issue- he was not able to get prescriptions filled and took some left over antibiotics- case management asked to help with meds  8/29- back in ER for the same- apparently despite case management involvement, he was not able to get the prescriptions   Hospital Course:  Active Problems: Chronic hip, back and foot pain   Myeloproliferative disease  - underlying myeloproliferative disorder - PET scan>> diffusely increased tracer localization throughout the axial and appendicular skeleton - this pattern can also be seen in patients with diffuse metastatic disease and metabolic bone disease (metabolic disease considered less  likely in this patient due to absence of skull involvement). - will involve palliative care to address [place] with patient and family and help with long term pain control and repeated hospital admissions - Neurontin 200 mg TID- apparently made him sleepy last night and Neurontin was stopped -  he wants to go to SNF- PT eval recommended SNF but he now has to pay out of pocket- he states he cannot afford this and will go home -  - when RN mobilized him, he noted that the patient did not  have too much difficulty with mobility - d/c with  Tylenol QID, Robaxin 750 TID, Indocin 50 mg TID, Voltaren gel to knees QID - leaving strict instructions on d/c paperwork for family to ensure he is receiving these medications - evaluated by Dr Marin Olp last admission- will notify him tomorrow of admission - bone marrow biopsy>>BONE MARROW WITH EXTENSIVE FIBROSIS AND NEW BONE FORMATION- favor,  myeloproliferative neoplasm particularly primary myelofibrosis or a myeloproliferative/myelodysplastic neoplasm with associated fibrosis - will need conservative management - cont Feragon and transfuse as needed- have notified Dr Marin Olp of his admission- he also recommended [name] which was given today     Urinary retention/   Acute lower UTI? - I am not certain he has a UTI- UA can be positive in setting of foley  - required foley last admission- has not yet received a voiding trial - I did order Flomax for him when last discharged but he is currently not on this-  - 9/9 - failed voiding trial again- subsequently resumed Flomax - he states he has an appt with Urology on the 17th but I have called the office and he does not have one - I have made an appt for him for 9/17 10:45 for follow up - as culture is growing Pseudomonas in setting of foley cath, will discharge with Cipro x 7 days- I will f/u culture results for sensitivities   Chronic grade 1 diastolic CHF (congestive heart failure) ([place]) Pulmonary hypertension severe - most recent ECHO 8/18  - hold lasix and following weights while in hospital  CAD - ASA  Gout - note recent ER vists for gout vs cellulitis of right foot - start Allopurinol again for prophylaxis  Weight loss - likely due to above  -  weight in 9/16 was 245 lb and now is 184 lb  Social/disposition:  Very poor insight into medical conditions. Physician attempt to have a [place] discussion in 11/17 and patient refused to discuss it.  He has had numerous ER visits and  hospitalizations this year. Difficult to find family and they are never in the hospital. He states he lives with a son who has no cell phone and is "sick". His daughter lives in [place] and has 2 jobs. Palliative consult. Likely needs to be in a long term care facility if no on get him to his appts or get him his medications.    Discharge Instructions  Discharge Instructions    Diet - low sodium heart healthy    Complete by:  As directed    Increase activity slowly    Complete by:  As directed      Allergies as of 01/26/2017   No Known Allergies     Medication List    STOP taking these medications   furosemide 40 MG tablet Commonly known as:  LASIX   potassium chloride SA 20 MEQ tablet Commonly known as:  K-DUR,KLOR-CON     TAKE these  medications   acetaminophen 325 MG tablet Commonly known as:  TYLENOL Take 2 tablets (650 mg total) by mouth 4 (four) times daily. What changed:  when to take this  reasons to take this   allopurinol 100 MG tablet Commonly known as:  ZYLOPRIM Take 1 tablet (100 mg total) by mouth daily.   aspirin 325 MG tablet Take 325 mg by mouth daily.   ciprofloxacin 500 MG tablet Commonly known as:  CIPRO Take 1 tablet (500 mg total) by mouth 2 (two) times daily.   diclofenac sodium 1 % Gel Commonly known as:  VOLTAREN Apply 4 g topically 4 (four) times daily. Apply to bilateral  knees What changed:  when to take this  reasons to take this   feeding supplement (GLUCERNA SHAKE) Liqd Take 118.5 mLs by mouth daily. Reported on 08/21/2015   ferrous gluconate 240 (27 FE) MG tablet Commonly known as:  FERGON Take 240 mg by mouth daily.   fluticasone 50 MCG/ACT nasal spray Commonly known as:  FLONASE Place 2 sprays into both nostrils daily. Reported on 05/02/2015   gabapentin 100 MG capsule Commonly known as:  NEURONTIN Take 2 capsules (200 mg total) by mouth 3 (three) times daily as needed (pain). Reported on 08/22/2015   hydrocerin  Crea Apply Eucerin cream to BLE Q day after bathing and roughly towel drying to remove loose skin What changed:  how much to take  how to take this  when to take this  reasons to take this  additional instructions   hydrOXYzine 10 MG tablet Commonly known as:  ATARAX/VISTARIL Take 1 tablet (10 mg total) by mouth every 4 (four) hours as needed for itching.   indomethacin 50 MG capsule Commonly known as:  INDOCIN Take 1 capsule (50 mg total) by mouth 3 (three) times daily with meals.   methocarbamol 750 MG tablet Commonly known as:  ROBAXIN Take 1 tablet (750 mg total) by mouth 3 (three) times daily.   nitroGLYCERIN 0.4 MG SL tablet Commonly known as:  NITROSTAT Place 1 tablet (0.4 mg total) under the tongue every 5 (five) minutes as needed for chest pain.   OXYGEN Inhale 2 L into the lungs continuous.   polyethylene glycol packet Commonly known as:  MIRALAX / GLYCOLAX Take 17 g by mouth daily as needed for mild constipation.   tamsulosin 0.4 MG Caps capsule Commonly known as:  FLOMAX Take 1 capsule (0.4 mg total) by mouth daily.            Discharge Care Instructions        Start     Ordered   01/27/17 0000  allopurinol (ZYLOPRIM) 100 MG tablet  Daily     01/26/17 1235   01/27/17 0000  tamsulosin (FLOMAX) 0.4 MG CAPS capsule  Daily     01/26/17 1235   01/26/17 0000  acetaminophen (TYLENOL) 325 MG tablet  4 times daily     01/26/17 1235   01/26/17 0000  diclofenac sodium (VOLTAREN) 1 % GEL  4 times daily     01/26/17 1235   01/26/17 0000  methocarbamol (ROBAXIN) 750 MG tablet  3 times daily     01/26/17 1235   01/26/17 0000  polyethylene glycol (MIRALAX / GLYCOLAX) packet  Daily PRN     01/26/17 1235   01/26/17 0000  Increase activity slowly     01/26/17 1235   01/26/17 0000  Diet - low sodium heart healthy     01/26/17 1235   01/26/17  0000  ciprofloxacin (CIPRO) 500 MG tablet  2 times daily     01/26/17 1242      No Known  Allergies   Procedures/Studies:    Dg Chest 2 View  Result Date: 01/23/2017 CLINICAL DATA:  Weakness, shortness of breath, not feeling well, history hypertension, coronary artery disease, type II diabetes mellitus, myeloproliferative disorder, CHF EXAM: CHEST  2 VIEW COMPARISON:  12/26/2016 FINDINGS: Enlargement of cardiac silhouette. Mediastinal contours normal. Chronic accentuation of interstitial markings likely mild pulmonary edema. No pleural effusion or pneumothorax. Diffuse osseous sclerosis consistent with history of myeloproliferative disorder. IMPRESSION: Probable mild pulmonary edema. Electronically Signed   By: Lavonia Dana M.D.   On: 01/23/2017 19:26   Dg Foot Complete Right  Result Date: 01/09/2017 CLINICAL DATA:  Foot pain and swelling. EXAM: RIGHT FOOT COMPLETE - 3+ VIEW COMPARISON:  None. FINDINGS: No acute fracture or malalignment. Hallux valgus with bony bunion. Osteopenic appearance with trabecular coarsening. Patient has history of thrombocytosis. IMPRESSION: 1. No acute finding. 2. Prominent hallux valgus. Electronically Signed   By: Monte Fantasia M.D.   On: 01/09/2017 14:17       Discharge Exam: Vitals:   01/25/17 2122 01/26/17 0501  BP: (!) 151/73 (!) 142/65  Pulse: 66 (!) 59  Resp: 18 16  Temp: 98.2 F (36.8 C) 98.1 F (36.7 C)  SpO2: 99% 99%   Vitals:   01/25/17 1431 01/25/17 1703 01/25/17 2122 01/26/17 0501  BP: (!) 139/58  (!) 151/73 (!) 142/65  Pulse: (!) 58  66 (!) 59  Resp: '16  18 16  '$ Temp: 98.3 F (36.8 C)  98.2 F (36.8 C) 98.1 F (36.7 C)  TempSrc: Oral  Oral Oral  SpO2: 100%  99% 99%  Weight:  83.5 kg (184 lb)    Height:  '6\' 3"'$  (1.905 m)      General: Pt is alert, awake, not in acute distress Cardiovascular: RRR, S1/S2 +, no rubs, no gallops Respiratory: CTA bilaterally, no wheezing, no rhonchi Abdominal: Soft, NT, ND, bowel sounds + Extremities: no edema, no cyanosis    The results of significant diagnostics from this  hospitalization (including imaging, microbiology, ancillary and laboratory) are listed below for reference.     Microbiology: Recent Results (from the past 240 hour(s))  Urine culture     Status: Abnormal (Preliminary result)   Collection Time: 01/23/17  9:20 PM  Result Value Ref Range Status   Specimen Description URINE, RANDOM  Final   Special Requests NONE  Final   Culture (A)  Final    >=100,000 COLONIES/mL PSEUDOMONAS AERUGINOSA SUSCEPTIBILITIES TO FOLLOW    Report Status PENDING  Incomplete     Labs: BNP (last 3 results)  Recent Labs  11/14/16 0504 12/26/16 1543 01/06/17 0931  BNP 186.6* 409.8* 741.4*   Basic Metabolic Panel:  Recent Labs Lab 01/23/17 1902 01/24/17 0425 01/25/17 0253  NA 136 141 141  K 4.3 4.2 4.4  CL 106 109 110  CO2 '23 24 25  '$ GLUCOSE 108* 95 102*  BUN 31* 32* 37*  CREATININE 1.13 1.00 1.26*  CALCIUM 9.2 9.5 9.3   Liver Function Tests: No results for input(s): AST, ALT, ALKPHOS, BILITOT, PROT, ALBUMIN in the last 168 hours. No results for input(s): LIPASE, AMYLASE in the last 168 hours. No results for input(s): AMMONIA in the last 168 hours. CBC:  Recent Labs Lab 01/23/17 1902 01/24/17 0425 01/25/17 0253  WBC 12.8* 12.9* 11.6*  NEUTROABS 8.3*  --   --  HGB 9.9* 9.8* 9.2*  HCT 33.3* 32.5* 30.5*  MCV 86.0 86.0 85.9  PLT 503* 512* 420*   Cardiac Enzymes: No results for input(s): CKTOTAL, CKMB, CKMBINDEX, TROPONINI in the last 168 hours. BNP: Invalid input(s): POCBNP CBG: No results for input(s): GLUCAP in the last 168 hours. D-Dimer No results for input(s): DDIMER in the last 72 hours. Hgb A1c No results for input(s): HGBA1C in the last 72 hours. Lipid Profile No results for input(s): CHOL, HDL, LDLCALC, TRIG, CHOLHDL, LDLDIRECT in the last 72 hours. Thyroid function studies No results for input(s): TSH, T4TOTAL, T3FREE, THYROIDAB in the last 72 hours.  Invalid input(s): FREET3 Anemia work up  Recent Labs   01/24/17 0425  FERRITIN 448*  TIBC 178*  IRON 48  RETICCTPCT 2.5   Urinalysis    Component Value Date/Time   COLORURINE AMBER (A) 01/23/2017 2120   APPEARANCEUR CLOUDY (A) 01/23/2017 2120   LABSPEC 1.012 01/23/2017 2120   PHURINE 5.0 01/23/2017 2120   GLUCOSEU NEGATIVE 01/23/2017 2120   GLUCOSEU NEGATIVE 05/02/2015 1008   HGBUR SMALL (A) 01/23/2017 2120   BILIRUBINUR NEGATIVE 01/23/2017 2120   KETONESUR NEGATIVE 01/23/2017 2120   PROTEINUR >=300 (A) 01/23/2017 2120   UROBILINOGEN 0.2 05/02/2015 1008   NITRITE NEGATIVE 01/23/2017 2120   LEUKOCYTESUR LARGE (A) 01/23/2017 2120   Sepsis Labs Invalid input(s): PROCALCITONIN,  WBC,  LACTICIDVEN Microbiology Recent Results (from the past 240 hour(s))  Urine culture     Status: Abnormal (Preliminary result)   Collection Time: 01/23/17  9:20 PM  Result Value Ref Range Status   Specimen Description URINE, RANDOM  Final   Special Requests NONE  Final   Culture (A)  Final    >=100,000 COLONIES/mL PSEUDOMONAS AERUGINOSA SUSCEPTIBILITIES TO FOLLOW    Report Status PENDING  Incomplete     Time coordinating discharge: Over 30 minutes  SIGNED:   Debbe Odea, MD  [facility] 01/26/2017, 12:58 PM Pager   If 7PM-7AM, please contact night-coverage [email or web address] Password [email or web address]

## 2017-01-27 ENCOUNTER — Telehealth: Payer: Self-pay | Admitting: Behavioral Health

## 2017-01-27 LAB — URINE CULTURE

## 2017-01-27 NOTE — Telephone Encounter (Signed)
Patient declined TCM/Hospital Follow-up at this time. He voiced that he would like to schedule an appointment for next month with PCP. Also, patient stated that he has not made a decision about palliative care. An appointment has been scheduled for 03/01/17 at 11:00 AM with Dr. Larose Kells. Advised patient that if anything changes please call the office to be seen sooner or if symptoms are worsening or more severe seek the emergency department. Patient verbalized understanding and did not have any further questions or concerns before call ended.

## 2017-01-27 NOTE — Telephone Encounter (Signed)
Patient was made aware of the provider's recommendations below & voiced understanding.

## 2017-01-27 NOTE — Telephone Encounter (Signed)
Juan [name], one more thing. lease call the patient, advise is VERY important to follow-up with urology to have the foley removed, according to the discharge summary he has an appointment with them 02/01/2017. If  he can't  keep that appointment he is to let us know.

## 2017-01-28 ENCOUNTER — Other Ambulatory Visit: Payer: Self-pay | Admitting: *Deleted

## 2017-01-28 NOTE — Patient Outreach (Signed)
[facility]) Care Management  01/28/2017  Airik Goodlin 1940/09/30 [id number]  Referral received on 9/12 post discharged on 9/11  Initial outreach attempt however unsuccessful and RN unable to leave a message to request a call back. Will continue outreach attempts for this pt for possible [facility] service and scheduled ongoing follow up calls accordingly.  Raina Mina, RN Care Management Coordinator [place] Office [phone]

## 2017-01-28 NOTE — Progress Notes (Signed)
Notified by [name] RN Juan [name] that [name] refused to allow the home care RN to perform a physical assessment and/or review meds. She advised that he will be a nonadmit for the high-risk initiative program. She has advised [facility] and has made an APS referral. She expects that he will return to the hospital.

## 2017-01-29 ENCOUNTER — Other Ambulatory Visit: Payer: Self-pay | Admitting: Pharmacist

## 2017-01-29 ENCOUNTER — Other Ambulatory Visit: Payer: Self-pay | Admitting: *Deleted

## 2017-01-29 NOTE — Patient Outreach (Signed)
Request received from [name] to refer patient to [facility].  Referral made on 01/27/17.  Meals will begin as soon as possible.  Programmer, multimedia will notify patient.

## 2017-01-29 NOTE — Patient Outreach (Signed)
[facility]) Care Management  01/29/2017  Juan [name] [birth year] [id number]  Patient was referred to [facility] by [facility] for "medication management needs for obtaining medications and taking them."   Successful phone outreach to patient, he verified his date of birth and address.  Patient reports he has his medications at this time.  When trying to obtain verbal consent from patient, he said he wasn't feeling well and couldn't talk, requested a call back later, and disconnected the call.     Plan:  Will make an outreach attempt to patient in the next 2 weeks.     Karrie Meres, PharmD, [place] [phone]

## 2017-01-29 NOTE — Patient Outreach (Signed)
[facility]) Care Management  01/29/2017  Juan [name] [birth year] [id number]   Transition of care   RN attempted outreach call today and spoke with pt with introduction of [facility] and obtained pt identifiers. RN further explained the purpose for today's call and attempted to inquire on pt's possible needs. Pt states today was nt a god day requested to discuss the reason why I was calling on a home visit. RN inquired if pt would be willing to work with this RN in improving or managing his help. Pt states he would and a home visit was arranged based upon pt's request for next week (Wednesday). Will visit accordingly and attempt to address pt's needs and provided available community resources accordingly. RN inquired if pt wished to review his discharge orders at this time or could he have them available for review on the scheduled home visit. Pt indicated he would try to have them available at that time. RN will follow up accordingly.   Note pt not willing to review his medications or discharge sheet at this time.  Raina Mina, RN Care Management Coordinator [place] Office [phone]

## 2017-02-01 ENCOUNTER — Ambulatory Visit: Payer: Self-pay | Admitting: Internal Medicine

## 2017-02-01 DIAGNOSIS — N130 Hydronephrosis with ureteropelvic junction obstruction: Secondary | ICD-10-CM | POA: Diagnosis not present

## 2017-02-01 DIAGNOSIS — N390 Urinary tract infection, site not specified: Secondary | ICD-10-CM | POA: Diagnosis not present

## 2017-02-01 DIAGNOSIS — R338 Other retention of urine: Secondary | ICD-10-CM | POA: Diagnosis not present

## 2017-02-01 DIAGNOSIS — B999 Unspecified infectious disease: Secondary | ICD-10-CM | POA: Diagnosis not present

## 2017-02-01 DIAGNOSIS — T83091A Other mechanical complication of indwelling urethral catheter, initial encounter: Secondary | ICD-10-CM | POA: Diagnosis not present

## 2017-02-03 ENCOUNTER — Other Ambulatory Visit: Payer: Self-pay | Admitting: *Deleted

## 2017-02-03 ENCOUNTER — Telehealth: Payer: Self-pay

## 2017-02-03 NOTE — Telephone Encounter (Signed)
Spoke w/ Lattie Haw, RN w/ [facility]- she saw Pt today at home and wanted to call to update [place] on Pt status. She wanted me to schedule a hosp f/u for Pt- Pt had mentioned to her that he did not have hosp f/u with Dr. Larose Kells because he was informed by the office that because Pt did not have co-pay that we wouldn't see him. Informed Lattie Haw that [place], RN called to schedule TCM on 01/27/2017 which Pt refused and stated he would see PCP on 03/01/2017 at 11:00 AM. Lattie Haw verbalized understanding, at this time call was forwarded to PCP.

## 2017-02-03 NOTE — Patient Outreach (Signed)
[facility]) Care Management  02/03/2017  Juan [name] 05/18/1941 [id number]   RN visited pt as requested and verified identifiers. NOte pt requested a home visit to discussed [facility] services and did not wish to discussed any information via the last telephone conversation. RN explained [facility] services and the purpose for contacting him for [facility] services. Reminded pt of his discussion with [facility] hospital liaison Juan Brood, RN and further explained case management services.RN inquired on the d/c sheet or information provided to pt upon discharge. Pt states he as information that his providers to review with him so he does not have a copy of these instructions and did not wish to review with this RN. Pt feels he needs more services prior to coming home and expressed how he wishes to be placed into a SNF for rehabilitation then to return home with more strength. States he had informed the hospital of this however pt was returned home with orders for [facility] however this RN has been informed that this services were discontinued. Hospital liaison Juan Brood, RN) has also informed this RN that pt has declined [facility] services at this time. Several issues were discussed however pt feels he may not need the [facility] services:  MEDICATION: Pt states several individuals have checked his medications and he feels like this is not something that should take place on each visit. RN explained the purpose for reviewing his medication if assisted is needed however pt feels this is not something he wants to allow RN to perform and declined medication review at this time. RN could not verify if pt has enough medication or needed refills.  TRANSPORTATION: RN inquire further pt's transportation needs.  RN discussed the resources for transportation with [facility] however pt opt to decline and prefers to cover the cost of a private taxi which will start back once his catheter is removed. States currently while  having the indwelling cath he will be provided services via ambulance tranportation to his medical appointments. This information was provided by the pt.  APPOINTMENTS:  Pt reports he has visited his nephrologist concerning his indwelling catheter however does not have a visit with his primary care provider until next month due to limited funds. HF: RN discussed the HF zones as pt states some minimal swelling to his bilateral legs but no congestion or difficulty breathing. RN inquired on pt's weight as pt states two of his providers has only requested pt to weight every other day however it has been over two days since pt's reports his last weight at 184 lbs. Pt does not have an interest in a plan of care with generated goals at this time.    RN discussed pt's private taxi can be expensive and encouraged pt to use the [facility] services when capable and clear by his provider in order to use the additional funds for his needed co-pays. RN has offered community case management services with home visits however pt needs to be willing to participate with goals and a plan of care in managing his care independently. Also offered to [facility] a referral to [facility] social worker to assist with possible placement however pt needs. Pt initial decline all [facility] services however RN stress the importance of needing such assistance in the home and inquired if pt wishes to reconsider. Pt requested to "think about it" over the weekend and requesting RN card. Pt states his I call you Monday then we will pursue consult with the referral process for a social  worker however if no call is made to [place] then pt has decided not to engage in [facility] services. Pt understands RN will consult with his primary provider Dr. Larose Kells and received a verbal consent to pursue. Pt is aware that this RN will communicate with Dr. Larose Kells concerning his disposition with [facility] services.  Based upon the above information RN contacted Dr. Larose Kells office and spoke with his nurse  Juan [name] and Dr. Larose Kells concerning the home visit today and pt's current status with [facility] as no active for case management services at this time as pt wishes to think about it and notify this RN on Monday. Dr. Larose Kells very appreciative with understanding that a case closure letter will be sent if pt does not contact RN as indicated above.  RN has also updated Juan Brood, RN (hospital liaison with [facility]).   Juan Mina, RN Care Management Coordinator [place] Office [phone]

## 2017-02-04 ENCOUNTER — Other Ambulatory Visit: Payer: Self-pay | Admitting: Urology

## 2017-02-04 DIAGNOSIS — R338 Other retention of urine: Secondary | ICD-10-CM

## 2017-02-04 DIAGNOSIS — N1330 Unspecified hydronephrosis: Secondary | ICD-10-CM

## 2017-02-04 NOTE — Telephone Encounter (Signed)
Attempted to reach patient x 3 to discuss the provider's recommendations below. Per recording, the voice mailbox has not been setup; unable to leave a message at this time. Will try again later.

## 2017-02-04 NOTE — Telephone Encounter (Signed)
thx

## 2017-02-04 NOTE — Telephone Encounter (Addendum)
Comments from the patient noted, to my knowledge we have never turn down a patient d/t lack of co-pay. Unfortunately getting him to come to the office or follow medical advice has been a challenge for years. Please call the patient:  He has a Foley catheter and is imperative that he sees urology and have the catheter removed, please let him know that having a catheter for longer than necessarily WILL lead to complications , some very serious like septicemia-death. Perhaps we can arrange a OV to help him

## 2017-02-05 NOTE — Telephone Encounter (Signed)
Attempted to contact patient again per the provider's request below. No answer at the listed number. Voicemail unavailable as well.

## 2017-02-08 ENCOUNTER — Other Ambulatory Visit: Payer: Self-pay | Admitting: Pharmacist

## 2017-02-08 ENCOUNTER — Ambulatory Visit (HOSPITAL_COMMUNITY)
Admission: RE | Admit: 2017-02-08 | Discharge: 2017-02-08 | Disposition: A | Discharge status: Home / Self Care | Payer: Medicare Other | Source: Ambulatory Visit | Attending: Urology | Admitting: Urology

## 2017-02-08 DIAGNOSIS — Z960 Presence of urogenital implants: Secondary | ICD-10-CM | POA: Insufficient documentation

## 2017-02-08 DIAGNOSIS — I5040 Unspecified combined systolic (congestive) and diastolic (congestive) heart failure: Secondary | ICD-10-CM | POA: Diagnosis not present

## 2017-02-08 DIAGNOSIS — N289 Disorder of kidney and ureter, unspecified: Secondary | ICD-10-CM | POA: Insufficient documentation

## 2017-02-08 DIAGNOSIS — N390 Urinary tract infection, site not specified: Secondary | ICD-10-CM | POA: Diagnosis not present

## 2017-02-08 DIAGNOSIS — R338 Other retention of urine: Secondary | ICD-10-CM | POA: Insufficient documentation

## 2017-02-08 DIAGNOSIS — R339 Retention of urine, unspecified: Secondary | ICD-10-CM | POA: Diagnosis not present

## 2017-02-08 DIAGNOSIS — J449 Chronic obstructive pulmonary disease, unspecified: Secondary | ICD-10-CM | POA: Diagnosis not present

## 2017-02-08 DIAGNOSIS — N1330 Unspecified hydronephrosis: Secondary | ICD-10-CM

## 2017-02-08 NOTE — Patient Outreach (Signed)
[facility]) Care Management  02/08/2017  Juan [name] 1941-04-04 [id number]  Attempted to follow-up with patient per his previous request.  No answer, no voicemail set-up.    Plan:  Will make second outreach attempt within the next week.   Karrie Meres, PharmD, [place] [phone]

## 2017-02-09 ENCOUNTER — Encounter: Payer: Self-pay | Admitting: *Deleted

## 2017-02-09 ENCOUNTER — Ambulatory Visit (HOSPITAL_COMMUNITY)
Admission: RE | Admit: 2017-02-09 | Discharge: 2017-02-09 | Disposition: A | Discharge status: Home / Self Care | Payer: Medicare Other | Source: Ambulatory Visit | Attending: Cardiology | Admitting: Cardiology

## 2017-02-09 VITALS — BP 145/74 | HR 62 | Wt 176.4 lb

## 2017-02-09 DIAGNOSIS — G43909 Migraine, unspecified, not intractable, without status migrainosus: Secondary | ICD-10-CM | POA: Insufficient documentation

## 2017-02-09 DIAGNOSIS — D471 Chronic myeloproliferative disease: Secondary | ICD-10-CM

## 2017-02-09 DIAGNOSIS — I5032 Chronic diastolic (congestive) heart failure: Secondary | ICD-10-CM

## 2017-02-09 DIAGNOSIS — I5042 Chronic combined systolic (congestive) and diastolic (congestive) heart failure: Secondary | ICD-10-CM | POA: Insufficient documentation

## 2017-02-09 DIAGNOSIS — I10 Essential (primary) hypertension: Secondary | ICD-10-CM | POA: Diagnosis not present

## 2017-02-09 DIAGNOSIS — Z7982 Long term (current) use of aspirin: Secondary | ICD-10-CM | POA: Diagnosis not present

## 2017-02-09 DIAGNOSIS — Z87891 Personal history of nicotine dependence: Secondary | ICD-10-CM | POA: Insufficient documentation

## 2017-02-09 DIAGNOSIS — G4733 Obstructive sleep apnea (adult) (pediatric): Secondary | ICD-10-CM | POA: Insufficient documentation

## 2017-02-09 DIAGNOSIS — I2510 Atherosclerotic heart disease of native coronary artery without angina pectoris: Secondary | ICD-10-CM | POA: Diagnosis not present

## 2017-02-09 DIAGNOSIS — E1122 Type 2 diabetes mellitus with diabetic chronic kidney disease: Secondary | ICD-10-CM | POA: Insufficient documentation

## 2017-02-09 DIAGNOSIS — I130 Hypertensive heart and chronic kidney disease with heart failure and stage 1 through stage 4 chronic kidney disease, or unspecified chronic kidney disease: Secondary | ICD-10-CM | POA: Insufficient documentation

## 2017-02-09 DIAGNOSIS — C946 Myelodysplastic disease, not classified: Secondary | ICD-10-CM | POA: Insufficient documentation

## 2017-02-09 DIAGNOSIS — Z79899 Other long term (current) drug therapy: Secondary | ICD-10-CM | POA: Diagnosis not present

## 2017-02-09 DIAGNOSIS — N183 Chronic kidney disease, stage 3 unspecified: Secondary | ICD-10-CM

## 2017-02-09 LAB — BASIC METABOLIC PANEL
Anion gap: 5 (ref 5–15)
BUN: 28 mg/dL — ABNORMAL HIGH (ref 6–20)
CHLORIDE: 108 mmol/L (ref 101–111)
CO2: 25 mmol/L (ref 22–32)
CREATININE: 1.23 mg/dL (ref 0.61–1.24)
Calcium: 9.8 mg/dL (ref 8.9–10.3)
GFR calc non Af Amer: 56 mL/min — ABNORMAL LOW (ref >60)
Glucose, Bld: 117 mg/dL — ABNORMAL HIGH (ref 65–99)
Potassium: 4.5 mmol/L (ref 3.5–5.1)
Sodium: 138 mmol/L (ref 135–145)

## 2017-02-09 MED ORDER — HYDRALAZINE HCL 25 MG PO TABS
25.0000 mg | ORAL_TABLET | Freq: Three times a day (TID) | ORAL | 2 refills | Status: DC
Start: 2017-02-09 — End: 2017-06-30

## 2017-02-09 MED FILL — hydrALAZINE HCL 25 MG TABS: 25 | 30 days supply | Qty: 90 | Fill #0

## 2017-02-09 NOTE — Progress Notes (Signed)
Patient ID: Juan [name], male   DOB: Dec 15, 1940, 76 y.o.   MRN: [id number]    Advanced Heart Failure Clinic Note   PCP: Dr Larose Kells  Primary HF Cardiologist: Dr Haroldine Laws  Primary Cardiologist: Dr Johnsie Cancel   HPI: Juan [name] is a 76 y.o. male with history of Diastolic HF, OSA, HTN, DM2, and myeloproliferative disorder followed by Oncology.   He was seen by onc 10/22/14 for his myeloproliferative disorder. 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   Admitted to [facility] 01/2015 with increased dyspnea and volume overload. Diuresed with lasix and transitioned to lasix 40 mg three times a day.   Admitted twice in  December 2017 with volume overload.  Diuresed with IV lasix the transitioned to lasix 40 mg twice a day. Discharge weight 198 pounds. Discharged to SNF.    Admitted 10/08/16-10/17/16 with volume overload. PA pressure on Echo was 85 mmHg. This was a newly high PA pressure for him. VQ scan negative for PE. ANA, RA and HIV negative. He was diuresed with IV lasix about 15 pounds. Discharge weight was 194 pounds. It was recommended that he go to SNF, but he preferred to go home with home health. He also was started on home 02 at 2L.   Admitted earlier 12/2016 with hip and flank pain. Torsemide and metolazone held at discharge. Per note because patient "wasn't in a CHF flare".   Admitted 01/23/17-01/26/17 with hip, back and foot pain. 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 Dr. Marin Olp saw in the hospital and [name] was started. Weight was down to 185 pounds, his lasix was stopped at discharge.   He returns today for HF follow up. He feels weak, tired. Denies SOB, orthopnea, PND. Taking his medications with intermittent compliance. He is confused today about his medications, confused  about why he still has a foley. Says that he is supposed to get the Foley out in August. I told him this was September. He says that he does not have enough money for food and his medications.   Echo 01/21/15 EF 30%, moderate LVH, mild MR, PA peak pressure 59 mm Hg, Normal RV ECHO 12/2015: Ef 55-60%. Grade IDD Echo 09/2016 EF 55-60%, PA pressure 85 mm Hg.   Labs 01/29/2015: K 3.9 Creatinine 1.59  Labs 02/14/15: K 3.9 Creatinine 1.72  Labs 02/22/2015: K 4.1 Creatinine 1.82  Labs 05/23/2015: K 5.2 Creatinine 1.39  Labs 07/28/2016: K 4.6 Creatinine 1.29   Review of systems complete and found to be negative unless listed in HPI.    SH:  Social History   Social History  . Marital status: Widowed    Spouse name: N/A  . Number of children: 2  . Years of education: N/A   Occupational History  . retired    .  Retired   Social History Main Topics  . Smoking status: Former Smoker    Packs/day: 0.25    Years: 12.00    Types: Cigarettes    Quit date: 05/18/1966  . Smokeless tobacco: Never Used     Comment: quit smoking 45 years ago  . Alcohol use No  . Drug use: No  . Sexual activity: Yes   Other Topics Concern  . Not on file   Social History Narrative   Moved from [place] 2006  Widow 2007   Son lives with him    FH: Family History  Problem Relation Age of Onset  . Schizophrenia Son   . Colon cancer Neg Hx   . Prostate cancer Neg Hx   . Heart attack Neg Hx   . Diabetes Neg Hx     Past Medical History:  Diagnosis Date  . Allergic rhinitis   . Anemia   . Ascending aortic aneurysm ([place]) 03/2014   4.3cm on CT scan  . CAD (coronary artery disease)    dx elsewheer in past, no documentation. Non-ischemic myoview 2007  . Chronic diastolic CHF (congestive heart failure), NYHA class 2 (HCC)    Normal EF w/ grade 1 dd by echo 12/2015  . Edema    R>L leg, u/s 5-12 neg for DVT  . Hemorrhoid   . History of thrombocytosis   . Hypertension   . Migraine    "once/wk at least" (07/11/2013)    . Myeloproliferative disease ([place])   . Shortness of breath   . Sinus congestion   . Sleep apnea, obstructive    at some point used CPAP, was d/c  years ago  . Type II diabetes mellitus ([place])     Current Outpatient Prescriptions  Medication Sig Dispense Refill  . acetaminophen (TYLENOL) 325 MG tablet Take 2 tablets (650 mg total) by mouth 4 (four) times daily.    Marland Kitchen aspirin 325 MG tablet Take 325 mg by mouth daily.    Marland Kitchen b complex vitamins tablet Take 1 tablet by mouth daily.    . diclofenac sodium (VOLTAREN) 1 % GEL Apply 4 g topically 4 (four) times daily. Apply to bilateral  knees 1 Tube 0  . feeding supplement, GLUCERNA SHAKE, (GLUCERNA SHAKE) LIQD Take 118.5 mLs by mouth daily. Reported on 08/21/2015    . ferrous gluconate (FERGON) 240 (27 FE) MG tablet Take 240 mg by mouth daily.    . fluticasone (FLONASE) 50 MCG/ACT nasal spray Place 2 sprays into both nostrils daily. Reported on 05/02/2015    . furosemide (LASIX) 40 MG tablet Take 40 mg by mouth daily.    Marland Kitchen gabapentin (NEURONTIN) 100 MG capsule Take 2 capsules (200 mg total) by mouth 3 (three) times daily as needed (pain). Reported on 08/22/2015 60 capsule 0  . hydrocerin (EUCERIN) CREA Apply Eucerin cream to BLE Q day after bathing and roughly towel drying to remove loose skin (Patient taking differently: Apply 1 application topically daily as needed (dryness). ) 228 g 0  . hydrOXYzine (ATARAX/VISTARIL) 10 MG tablet Take 1 tablet (10 mg total) by mouth every 4 (four) hours as needed for itching. 12 tablet 0  . OXYGEN Inhale 2 L into the lungs continuous.    . polyethylene glycol (MIRALAX / GLYCOLAX) packet Take 17 g by mouth daily as needed for mild constipation. 14 each 0  . potassium chloride SA (K-DUR,KLOR-CON) 20 MEQ tablet Take 20 mEq by mouth as needed (when congested).     No current facility-administered medications for this encounter.     Vitals:   02/09/17 0913  BP: (!) 145/74  Pulse: 62  SpO2: 99%  Weight: 176 lb 6.4  oz (80 kg)   Wt Readings from Last 3 Encounters:  02/09/17 176 lb 6.4 oz (80 kg)  01/25/17 184 lb (83.5 kg)  01/10/17 184 lb (83.5 kg)     PHYSICAL EXAM: General:Ill appearing, arrived in wheelchair. No resp difficulty. HEENT: Wearing oxygen. Poor dentition. Neck: Supple. JVP 6-7 cm Carotids 2+  bilat; no bruits. No thyromegaly or nodule noted. Cor: PMI nondisplaced. RRR, No M/G/R noted Lungs: CTAB, normal effort. Abdomen: Soft, non-tender, non-distended, no HSM. No bruits or masses. +BS  Extremities: No cyanosis, clubbing, rash, R and LLE no edema.  Neuro: Alert & orientedx3, cranial nerves grossly intact. moves all 4 extremities w/o difficulty. Affect pleasant   ASSESSMENT & PLAN: 1. Chronic combined systolic and diastolic CHF, EF 50% echo 01/21/15, Normal myoview 2007.  EF now 55-60% in 09/2016. - NYHA III - Volume stable on exam. Continue lasix 40 mg daily.  - Continue KCl 65mq daily - Start hydralazine 25 mg TID. He has some of this with him today, as he has previously been on it but has not been taking it.  - Weight is down, likely due to progressively declining health.   2. CKD stage III - BMET today.   3. HTN - BP elevated. Will restart hydralazine as above.    4. 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 - Now seeing Dr. 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  - Started on aranesp.   Meds as above. BMET, follow up in 2 months. HF SW to see patient today.   EArbutus Leas NP  02/09/17   Greater than 50% of the (total minutes 40) visit spent in counseling/coordination of care regarding medication and disease state education.

## 2017-02-09 NOTE — Patient Outreach (Signed)
[facility]) Care Management  02/09/2017  Juan [name] [birth year] [id number]  CASE CLOSURE  RN did not get a response from pt with the understanding that case will be closed if there was no response from pt by this pass Monday. Pt's primary provider has been made aware and RN will send a case closure letter accordingly.  Raina Mina, RN Care Management Coordinator [place] Office [phone]

## 2017-02-09 NOTE — Addendum Note (Signed)
Encounter addended by: Louann Liv, LCSW on: 02/09/2017 12:19 PM<BR>    Actions taken: Sign clinical note

## 2017-02-09 NOTE — Progress Notes (Signed)
CSW referred by NP as patient continues on HF Fund for medications and has yet to apply for medicaid or Medicare D. CSW met with patient in the clinic to discuss options for resources and prescription assistance. Patient reports "someone was just out at the house for an application but wanted too much personal information". CSW discussed at length the need for Medicare D and resources through medicaid to assist patient in the community. Patient agreed to consider but appeared reluctant as he continued to comment on the concerns about providing personal information. CSW attempted to explain that most information already in [employer] and financial information is means to determine eligibility and would be kept confidential. Patient reluctant but will consider and discuss again on next clinic visit. CSW will follow up on next visit and discuss further. Raquel Sarna, [place], [place]

## 2017-02-09 NOTE — Patient Instructions (Signed)
START Hydralazine 25 mg tablet three times daily. Prescription has been sent to the [facility] outpatient pharmacy under the [place].  Routine lab work today. Will notify you of abnormal results, otherwise no news is good news!  Follow up 2 months.  _______________________________________________________________________________  _______________________________________________________________________________  Take all medication as prescribed the day of your appointment. Bring all medications with you to your appointment.  Do the following things EVERYDAY: 1) Weigh yourself in the morning before breakfast. Write it down and keep it in a log. 2) Take your medicines as prescribed 3) Eat low salt foods-Limit salt (sodium) to 2000 mg per day.  4) Stay as active as you can everyday 5) Limit all fluids for the day to less than 2 liters

## 2017-02-09 NOTE — Progress Notes (Signed)
Advanced Heart Failure Medication Review by a Pharmacist  Does the patient  feel that his/her medications are working for him/her?  yes  Has the patient been experiencing any side effects to the medications prescribed?  no  Does the patient measure his/her own blood pressure or blood glucose at home?  no   Does the patient have any problems obtaining medications due to transportation or finances?   Yes - no Rx insurance  Understanding of regimen: fair Understanding of indications: fair Potential of compliance: poor Patient understands to avoid NSAIDs. Patient understands to avoid decongestants.  Issues to address at subsequent visits: Compliance/understanding   Pharmacist comments: Juan [name] is a pleasant 76 yo M presenting with his medication bottles. He reports fair compliance with his regimen but is now only taking Lasix 40 mg daily and KCl 20 meq PRN "chest congestion". He states that those were the only HF medications he was told to take upon his last hospital discharge. He did not have any specific medication-related questions or concerns for me at this time.   [name]. Velva Harman, PharmD, BCPS, CPP Clinical Pharmacist Pager: [phone] Phone: [phone] 02/09/2017 9:25 AM      Time with patient: 10 minutes Preparation and documentation time: 2 minutes Total time: 12 minutes

## 2017-02-11 ENCOUNTER — Ambulatory Visit: Payer: Self-pay | Admitting: Internal Medicine

## 2017-02-12 ENCOUNTER — Other Ambulatory Visit: Payer: Self-pay | Admitting: Pharmacist

## 2017-02-12 NOTE — Patient Outreach (Signed)
[facility]) Care Management  02/12/2017  Anuel Sitter 06-Nov-1940 [id number]  Second outreach attempt to patient.  [facility] Pharmacist identified self and asked to speak with Jeneen Rinks.  Patient responded with that was him, but he wasn't available today and to call back next week.   He disconnected call before HIPAA details could be verified.    Initial referral was for patient assistance needs.  Per review of 02/09/17 note in chart from CHF clinic, CHF clinic LCSW spoke with patient regarding medication assistance options and patient was not willing to provide personal information required to apply.    Plan:  Will make a third phone outreach attempt to patient next week per his request.  Will consider case closure if patient is not willing to engage with [facility] Pharmacist.   Karrie Meres, PharmD, [place] [phone]

## 2017-02-19 ENCOUNTER — Other Ambulatory Visit: Payer: Self-pay | Admitting: Pharmacist

## 2017-02-19 NOTE — Patient Outreach (Signed)
[facility]) Care Management  02/19/2017  [name] 1940-09-30 [id number]  Fourth outreach attempt to patient, no answer, no voicemail set-up on his phone.  Patient has stated at previous calls he wanted a call back later.    Plan:  Will make an outreach attempt to patient next week.    Karrie Meres, PharmD, [place] [phone]

## 2017-02-22 ENCOUNTER — Other Ambulatory Visit: Payer: Self-pay | Admitting: Pharmacist

## 2017-02-22 NOTE — Patient Outreach (Signed)
[facility]) Care Management  02/22/2017  Juan [name] September 11, 1940 [id number]  Successful phone outreach to patient.  Patient was referred to [facility] Pharmacist for medication management needs.    Patient answered call, purpose of call was explained.  He did not wish to verify HIPAA details and stated he didn't want further calls regarding this.    Plan:  Will close this episode due to patient refusal of [place] services.   Karrie Meres, PharmD, [place] [phone]

## 2017-02-23 ENCOUNTER — Telehealth: Payer: Self-pay | Admitting: Licensed Clinical Social Worker

## 2017-02-23 NOTE — Telephone Encounter (Signed)
CSW met with [employer] today for weekly care coordination meeting. Paramedic reports that patient is refusing visits from program and unable to get in the home to assess needs. Discussion around referral to [facility] for Palliative Care support and referral made by Jennette Kettle, [facility] HF project manager. Patient to be discharged from [facility]. CSW will continue to be available to assist and support patient during HF Clinic appointments and as needed. Raquel Sarna, [place], [place]

## 2017-02-24 ENCOUNTER — Telehealth: Payer: Self-pay | Admitting: Internal Medicine

## 2017-02-24 ENCOUNTER — Telehealth: Payer: Self-pay | Admitting: Licensed Clinical Social Worker

## 2017-02-24 NOTE — Telephone Encounter (Signed)
Agree, start palliative care

## 2017-02-24 NOTE — Telephone Encounter (Signed)
Juan [name] at [facility] in [place] [phone] called for order to start palliative care for pt now that he is home. Juan [name] states family has asked them to start palliative care at home.

## 2017-02-24 NOTE — Telephone Encounter (Signed)
Please advise 

## 2017-02-24 NOTE — Telephone Encounter (Signed)
Spoke w/ Manus Gunning, orders given.

## 2017-02-24 NOTE — Telephone Encounter (Signed)
CSW spoke with Juan [name] @ [facility] regarding referral made yesterday for services. CSW discussed needs and recent patient refusal for services in the home. CSW hopeful [facility] can assist patient with home support and discussion on his health needs. Patient needs to apply for Medicare D prescription drug plan although has been reluctant as he does not want to reveal his personal information to the government. CSW also contacted patient to inform of referral to [facility] and paramedicine program discharge and patient agreeable to [facility] support. CSW will continue to be available to coordinate any care needs through the HF clinic. Raquel Sarna, [place], [place]

## 2017-03-01 ENCOUNTER — Encounter: Payer: Self-pay | Admitting: Internal Medicine

## 2017-03-01 ENCOUNTER — Telehealth: Payer: Self-pay | Admitting: Licensed Clinical Social Worker

## 2017-03-01 ENCOUNTER — Ambulatory Visit (INDEPENDENT_AMBULATORY_CARE_PROVIDER_SITE_OTHER): Payer: Medicare Other | Admitting: Internal Medicine

## 2017-03-01 VITALS — BP 134/68 | HR 67 | Resp 14 | Ht 75.0 in | Wt 184.0 lb

## 2017-03-01 DIAGNOSIS — Z23 Encounter for immunization: Secondary | ICD-10-CM | POA: Diagnosis not present

## 2017-03-01 DIAGNOSIS — I5032 Chronic diastolic (congestive) heart failure: Secondary | ICD-10-CM | POA: Diagnosis not present

## 2017-03-01 DIAGNOSIS — I10 Essential (primary) hypertension: Secondary | ICD-10-CM

## 2017-03-01 DIAGNOSIS — D471 Chronic myeloproliferative disease: Secondary | ICD-10-CM | POA: Diagnosis not present

## 2017-03-01 MED ORDER — HYDROCORTISONE 2.5 % EX CREA: TOPICAL_CREAM | Freq: Two times a day (BID) | CUTANEOUS | 1 refills | Status: DC

## 2017-03-01 NOTE — Patient Instructions (Addendum)
Continue same medications  Please come back in 3 months  Please appointment to see the urologist this week  We are referring you back to Dr. Marin Olp  Call for refills if needed  Use hydrocortisone 2.5% cream as needed for itching

## 2017-03-01 NOTE — Progress Notes (Signed)
Pre visit review using our clinic review tool, if applicable. No additional management support is needed unless otherwise documented below in the visit note. 

## 2017-03-01 NOTE — Telephone Encounter (Signed)
CSW received message from [facility] stating that patient refused program when contacted for home visit. Patient was not opened to services per his request. Raquel Sarna, [place], [place]

## 2017-03-01 NOTE — Progress Notes (Signed)
Subjective:    Patient ID: Juan [name], male    DOB: 1941/04/20, 76 y.o.   MRN: [id number]  DOS:  03/01/2017 Type of visit - description :  Hospital follow-up  Since I saw him the last time 10/2016  he has been admitted to hospital 4 times.He came to the office by taxi and is by himself.  Patient was admitted last on 01/23/2017 discharged 3 days later. History Main concern upon admission was right hip and flank pain He also had a Foley in that was supposed to be removed. Failed voiding trial 01/24/2017 and was represcribed Flomax Social issues were discussed, palliative care needed to be involved, the patient refused to discuss goals of care. 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 Pt needed and desired to go to a SNF but was unable to afford, went back home , lives w/ his son Saw cardiology 02/09/2017, was recommended to comply with hydralazine 25 mg 3 times a day. Saw urology as schedule after last admission, still has a foley in  Review of Systems Overall he states is doing okay. Reports good compliance with medication Denies problems with his breathing, he is using her portable oxygen today and states 'breathing is doing good". Appetite is good on and off Occasional nausea Complain of itching of the scrotum and upper back  Past Medical History:  Diagnosis Date  . Allergic rhinitis   . Anemia   . Ascending aortic aneurysm ([place]) 03/2014   4.3cm on CT scan  . CAD (coronary artery disease)    dx elsewheer in past, no documentation. Non-ischemic myoview 2007  . Chronic diastolic CHF (congestive heart failure), NYHA class 2 (HCC)    Normal EF w/ grade 1 dd by echo 12/2015  . Edema    R>L leg, u/s 5-12 neg for DVT  . Hemorrhoid   . History of thrombocytosis   . Hypertension   . Migraine    "once/wk at least" (07/11/2013)  . Myeloproliferative disease ([place])   .  Shortness of breath   . Sinus congestion   . Sleep apnea, obstructive    at some point used CPAP, was d/c  years ago  . Type II diabetes mellitus ([place])     Past Surgical History:  Procedure Laterality Date  . TOE SURGERY Right    "tried to straighten out big toe" (07/11/2013)    Social History   Social History  . Marital status: Widowed    Spouse name: N/A  . Number of children: 2  . Years of education: N/A   Occupational History  . retired    .  Retired   Social History Main Topics  . Smoking status: Former Smoker    Packs/day: 0.25    Years: 12.00    Types: Cigarettes    Quit date: 05/18/1966  . Smokeless tobacco: Never Used     Comment: quit smoking 45 years ago  . Alcohol use No  . Drug use: No  . Sexual activity: Yes   Other Topics Concern  . Not on file   Social History Narrative   Moved from [place] 2006   Widow 2007   Son lives with him       Allergies as of 03/01/2017   No Known Allergies     Medication List       Accurate as of 03/01/17  1:07 PM. Always use your  most recent med list.          acetaminophen 325 MG tablet Commonly known as:  TYLENOL Take 2 tablets (650 mg total) by mouth 4 (four) times daily.   aspirin 325 MG tablet Take 325 mg by mouth daily.   b complex vitamins tablet Take 1 tablet by mouth daily.   diclofenac sodium 1 % Gel Commonly known as:  VOLTAREN Apply 4 g topically 4 (four) times daily. Apply to bilateral  knees   feeding supplement (GLUCERNA SHAKE) Liqd Take 118.5 mLs by mouth daily. Reported on 08/21/2015   ferrous gluconate 240 (27 FE) MG tablet Commonly known as:  FERGON Take 240 mg by mouth daily.   fluticasone 50 MCG/ACT nasal spray Commonly known as:  FLONASE Place 2 sprays into both nostrils daily. Reported on 05/02/2015   furosemide 40 MG tablet Commonly known as:  LASIX Take 40 mg by mouth daily.   gabapentin 100 MG capsule Commonly known as:  NEURONTIN Take 2 capsules (200 mg total) by mouth  3 (three) times daily as needed (pain). Reported on 08/22/2015   hydrALAZINE 25 MG tablet Commonly known as:  APRESOLINE Take 1 tablet (25 mg total) by mouth 3 (three) times daily.   hydrocerin Crea Apply Eucerin cream to BLE Q day after bathing and roughly towel drying to remove loose skin   hydrocortisone 2.5 % cream Apply topically 2 (two) times daily.   hydrOXYzine 10 MG tablet Commonly known as:  ATARAX/VISTARIL Take 1 tablet (10 mg total) by mouth every 4 (four) hours as needed for itching.   OXYGEN Inhale 2 L into the lungs continuous.   polyethylene glycol packet Commonly known as:  MIRALAX / GLYCOLAX Take 17 g by mouth daily as needed for mild constipation.   potassium chloride SA 20 MEQ tablet Commonly known as:  K-DUR,KLOR-CON Take 20 mEq by mouth as needed (when congested).          Objective:   Physical Exam BP 134/68 (BP Location: Left Arm, Patient Position: Sitting, Cuff Size: Small)   Pulse 67   Resp 14   Ht 6\' 3"  (1.905 m)   Wt 184 lb (83.5 kg)   SpO2 94% Comment: On 2L O2  BMI 23.00 kg/m  General:   Well developed, despite his BMI he looks underweight. No distress HEENT:  Normocephalic . Face symmetric, atraumatic Lungs:  Decreased breath sounds but clear Normal respiratory effort, no intercostal retractions, no accessory muscle use. Heart: RRR,  no murmur.  no pretibial edema bilaterally  Abdomen:  Exam is limited, patient is sitting in wheelchair. Soft and nontender Skin: Scrotum and upper back inspected, no rash. Neurologic:  alert & oriented X3.  Speech normal, gait not tested Psych--  No anxious or depressed appearing.    Assessment & Plan:   Assessment Prediabetes  Neuropathy, on Neurontin prn and hydrocodone prn HTN CKD, creatinine ~ 1.7-1.9 Chronic edema and stasis dermatitis Morbid obesity CV: --CHF, EF 30% 01-2015, normal Myoview 2007, refuse it cardiac catheterization --Ascending aortic aneurysm per CT angio 03-2014, 4.3  cm --CT chest without done 06-2014 --  Refused MRA of the Ao 2016  --pulmonary hypertension: 10-2016: V/Q can neg, ANA-RF-ANCa (-) Hypoxia: On home oxygen since admission 10/2016 Hem-onc: ---Myeloproliferative disease , refuses treatment --- anemia --- thrombocytosiscytosis  BPH- bladder outlet obstruction Per urology 2017: "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 They recommend continuing Foley catheter, change every  4 weeks " OSA, used a CPAP at some point Poor compliance with advice: Saw psychiatry 04/07/2014 at the hospital and deemed to be competent to make his own decisions.  PLAN: HTN: Seems well-controlled, continue with hydralazine, Lasix, potassium. Last BMP satisfactory CHF: Seems stable Hypoxia: On oxygen supplementation, has his portable oxygen today, he seems stable. Myeloproliferative dz: Last seen by oncology in-house 01/26/2017, was rec aranesp. Will refer him for an outpatient eval Bladder outlet obstruction: After last hospital visit went to see urology per patient, still has a Foley in, states has appointment 03/04/2017 for follow-up. Encouraged him to keep the f/u, states he will. Social: He was willing to go to a SNF but unable to do so due to financial issues, at this point however patient is refusing help from programs like [facility] etc. I just referred to palliative care, hopefuly he will be able to be seen; of notice during the hospital stay, he refused to talk about goals of care. Pruritus, scrotum, upper back: Exam is normal, recommend hydrocortisone 2.5% as needed Flu shot today RTC 3 months

## 2017-03-01 NOTE — Assessment & Plan Note (Signed)
HTN: Seems well-controlled, continue with hydralazine, Lasix, potassium. Last BMP satisfactory CHF: Seems stable Hypoxia: On oxygen supplementation, has his portable oxygen today, he seems stable. Myeloproliferative dz: Last seen by oncology in-house 01/26/2017, was rec aranesp. Will refer him for an outpatient eval Bladder outlet obstruction: After last hospital visit went to see urology per patient, still has a Foley in, states has appointment 03/04/2017 for follow-up. Encouraged him to keep the f/u, states he will. Social: He was willing to go to a SNF but unable to do so due to financial issues, at this point however patient is refusing help from programs like [facility] etc. I just referred to palliative care, hopefuly he will be able to be seen; of notice during the hospital stay, he refused to talk about goals of care. Pruritus, scrotum, upper back: Exam is normal, recommend hydrocortisone 2.5% as needed Flu shot today RTC 3 months

## 2017-03-04 ENCOUNTER — Telehealth: Payer: Self-pay | Admitting: Licensed Clinical Social Worker

## 2017-03-04 NOTE — Telephone Encounter (Signed)
CSW made aware that patient has an appointment at [facility] on Tuesday and has no transportation. CSW contacted patient and will assist with obtaining transportation to his appointment to HF clinic as well as [facility]. CSW will follow up with patient on Monday in the clinic. Raquel Sarna, [place], [place]

## 2017-03-08 ENCOUNTER — Encounter (HOSPITAL_COMMUNITY): Payer: Self-pay

## 2017-03-08 ENCOUNTER — Ambulatory Visit (HOSPITAL_COMMUNITY)
Admission: RE | Admit: 2017-03-08 | Discharge: 2017-03-08 | Disposition: A | Discharge status: Home / Self Care | Payer: Medicare Other | Source: Ambulatory Visit | Attending: Internal Medicine | Admitting: Internal Medicine

## 2017-03-08 VITALS — BP 144/76 | HR 60 | Wt 174.0 lb

## 2017-03-08 DIAGNOSIS — C946 Myelodysplastic disease, not classified: Secondary | ICD-10-CM | POA: Diagnosis not present

## 2017-03-08 DIAGNOSIS — I10 Essential (primary) hypertension: Secondary | ICD-10-CM

## 2017-03-08 DIAGNOSIS — I5022 Chronic systolic (congestive) heart failure: Secondary | ICD-10-CM | POA: Diagnosis not present

## 2017-03-08 DIAGNOSIS — I5032 Chronic diastolic (congestive) heart failure: Secondary | ICD-10-CM

## 2017-03-08 DIAGNOSIS — Z87891 Personal history of nicotine dependence: Secondary | ICD-10-CM | POA: Insufficient documentation

## 2017-03-08 DIAGNOSIS — N183 Chronic kidney disease, stage 3 unspecified: Secondary | ICD-10-CM

## 2017-03-08 DIAGNOSIS — Z79899 Other long term (current) drug therapy: Secondary | ICD-10-CM | POA: Insufficient documentation

## 2017-03-08 DIAGNOSIS — I5042 Chronic combined systolic (congestive) and diastolic (congestive) heart failure: Secondary | ICD-10-CM | POA: Diagnosis not present

## 2017-03-08 DIAGNOSIS — I130 Hypertensive heart and chronic kidney disease with heart failure and stage 1 through stage 4 chronic kidney disease, or unspecified chronic kidney disease: Secondary | ICD-10-CM | POA: Insufficient documentation

## 2017-03-08 DIAGNOSIS — E1122 Type 2 diabetes mellitus with diabetic chronic kidney disease: Secondary | ICD-10-CM | POA: Insufficient documentation

## 2017-03-08 DIAGNOSIS — D471 Chronic myeloproliferative disease: Secondary | ICD-10-CM | POA: Diagnosis not present

## 2017-03-08 DIAGNOSIS — R531 Weakness: Secondary | ICD-10-CM | POA: Diagnosis not present

## 2017-03-08 DIAGNOSIS — Z7982 Long term (current) use of aspirin: Secondary | ICD-10-CM | POA: Diagnosis not present

## 2017-03-08 NOTE — Progress Notes (Signed)
Patient ID: Juan [name], male   DOB: December 27, 1940, 76 y.o.   MRN: [id number]    Advanced Heart Failure Clinic Note   PCP: Dr Larose Kells  Primary HF Cardiologist: Dr Haroldine Laws  Primary Cardiologist: Dr Johnsie Cancel   HPI: Juan [name] is a 76 y.o. male with history of Diastolic HF, OSA, HTN, DM2, and myeloproliferative disorder followed by Oncology.   He was seen by onc 10/22/14 for his myeloproliferative disorder. 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   Admitted to [facility] 01/2015 with increased dyspnea and volume overload. Diuresed with lasix and transitioned to lasix 40 mg three times a day.   Admitted twice in  December 2017 with volume overload.  Diuresed with IV lasix the transitioned to lasix 40 mg twice a day. Discharge weight 198 pounds. Discharged to SNF.    Admitted 10/08/16-10/17/16 with volume overload. PA pressure on Echo was 85 mmHg. This was a newly high PA pressure for him. VQ scan negative for PE. ANA, RA and HIV negative. He was diuresed with IV lasix about 15 pounds. Discharge weight was 194 pounds. It was recommended that he go to SNF, but he preferred to go home with home health. He also was started on home 02 at 2L.   Admitted earlier 12/2016 with hip and flank pain. Torsemide and metolazone held at discharge. Per note because patient "wasn't in a CHF flare".   Admitted 01/23/17-01/26/17 with hip, back and foot pain. 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 Dr. Marin Olp saw in the hospital and [name] was started. Weight was down to 185 pounds, his lasix was stopped at discharge.   Returns today for HF follow up. Weight is down 2 pounds from last visit. He continues to have limited mobility, spends most of his time in a wheelchair. Denies SOB, chest pain and orthopnea.  He is taking his medications with intermittent compliance. Recently referred to palliative care, but the patient refused. He continues to refuse enrollment in Medicare Part D despite assistance from our HF social worker. He follows with Dr. Larose Kells and last saw Dr. Larose Kells on 03/01/17 who noted that the patient refused to discuss goals of care.   Echo 01/21/15 EF 30%, moderate LVH, mild MR, PA peak pressure 59 mm Hg, Normal RV ECHO 12/2015: Ef 55-60%. Grade IDD Echo 09/2016 EF 55-60%, PA pressure 85 mm Hg.   Labs 01/29/2015: K 3.9 Creatinine 1.59  Labs 02/14/15: K 3.9 Creatinine 1.72  Labs 02/22/2015: K 4.1 Creatinine 1.82  Labs 05/23/2015: K 5.2 Creatinine 1.39  Labs 07/28/2016: K 4.6 Creatinine 1.29   Review of systems complete and found to be negative unless listed in HPI.    SH:  Social History   Social History  . Marital status: Widowed    Spouse name: N/A  . Number of children: 2  . Years of education: N/A   Occupational History  . retired    .  Retired   Social History Main Topics  . Smoking status: Former Smoker    Packs/day: 0.25    Years: 12.00    Types: Cigarettes    Quit date: 05/18/1966  . Smokeless tobacco: Never Used     Comment: quit smoking 45 years ago  . Alcohol use No  . Drug use: No  . Sexual activity: Yes  Other Topics Concern  . Not on file   Social History Narrative   Moved from [place] 2006   Widow 2007   Son lives with him    FH: Family History  Problem Relation Age of Onset  . Schizophrenia Son   . Colon cancer Neg Hx   . Prostate cancer Neg Hx   . Heart attack Neg Hx   . Diabetes Neg Hx     Past Medical History:  Diagnosis Date  . Allergic rhinitis   . Anemia   . Ascending aortic aneurysm ([place]) 03/2014   4.3cm on CT scan  . CAD (coronary artery disease)    dx elsewheer in past, no documentation. Non-ischemic myoview 2007  . Chronic diastolic CHF (congestive heart failure), NYHA class 2 (HCC)    Normal EF w/ grade 1 dd by echo 12/2015  . Edema     R>L leg, u/s 5-12 neg for DVT  . Hemorrhoid   . History of thrombocytosis   . Hypertension   . Migraine    "once/wk at least" (07/11/2013)  . Myeloproliferative disease ([place])   . Shortness of breath   . Sinus congestion   . Sleep apnea, obstructive    at some point used CPAP, was d/c  years ago  . Type II diabetes mellitus ([place])     Current Outpatient Prescriptions  Medication Sig Dispense Refill  . acetaminophen (TYLENOL) 325 MG tablet Take 2 tablets (650 mg total) by mouth 4 (four) times daily.    Marland Kitchen aspirin 325 MG tablet Take 325 mg by mouth daily.    Marland Kitchen b complex vitamins tablet Take 1 tablet by mouth daily.    . ferrous gluconate (FERGON) 240 (27 FE) MG tablet Take 240 mg by mouth daily.    . furosemide (LASIX) 40 MG tablet Take 40 mg by mouth daily.    . hydrALAZINE (APRESOLINE) 25 MG tablet Take 1 tablet (25 mg total) by mouth 3 (three) times daily. 90 tablet 2  . hydrocerin (EUCERIN) CREA Apply Eucerin cream to BLE Q day after bathing and roughly towel drying to remove loose skin (Patient taking differently: Apply 1 application topically daily as needed (dryness). ) 228 g 0  . potassium chloride SA (K-DUR,KLOR-CON) 20 MEQ tablet Take 20 mEq by mouth as needed (when congested).    . diclofenac sodium (VOLTAREN) 1 % GEL Apply 4 g topically 4 (four) times daily. Apply to bilateral  knees 1 Tube 0  . feeding supplement, GLUCERNA SHAKE, (GLUCERNA SHAKE) LIQD Take 118.5 mLs by mouth daily. Reported on 08/21/2015    . fluticasone (FLONASE) 50 MCG/ACT nasal spray Place 2 sprays into both nostrils daily. Reported on 05/02/2015    . gabapentin (NEURONTIN) 100 MG capsule Take 2 capsules (200 mg total) by mouth 3 (three) times daily as needed (pain). Reported on 08/22/2015 60 capsule 0  . hydrocortisone 2.5 % cream Apply topically 2 (two) times daily. 30 g 1  . hydrOXYzine (ATARAX/VISTARIL) 10 MG tablet Take 1 tablet (10 mg total) by mouth every 4 (four) hours as needed for itching. 12 tablet 0  .  OXYGEN Inhale 2 L into the lungs continuous.    . polyethylene glycol (MIRALAX / GLYCOLAX) packet Take 17 g by mouth daily as needed for mild constipation. 14 each 0   No current facility-administered medications for this encounter.     Vitals:   03/08/17 0938  BP: (!) 144/76  Pulse: 60  SpO2: 98%  Weight: 174 lb (78.9  kg)   Wt Readings from Last 3 Encounters:  03/08/17 174 lb (78.9 kg)  03/01/17 184 lb (83.5 kg)  02/09/17 176 lb 6.4 oz (80 kg)     PHYSICAL EXAM: General:Chronically ill appearing. No resp difficulty. Arrived in wheelchair.  HEENT: Normal Neck: Supple. JVP 9-10. Carotids 2+ bilat; no bruits. No thyromegaly or nodule noted. Cor: PMI nondisplaced. RRR, No M/G/R noted Lungs: CTAB, normal effort. Abdomen: Soft, non-tender, non-distended, no HSM. No bruits or masses. +BS  Extremities: No cyanosis, clubbing, or rash. 2+ edema to knees.  Neuro: Alert & orientedx3, cranial nerves grossly intact. moves all 4 extremities w/o difficulty. Affect pleasant   ASSESSMENT & PLAN: 1. Chronic combined systolic and diastolic CHF, EF 77% echo 01/21/15, Normal myoview 2007.  EF now 55-60% in 09/2016. - NYHA III chronically - Volume stable on exam. Continue lasix 40 mg daily.  - Continue hydralazine 25 mg TID. Will not increase as he is frail and health is clearing declining.    2. CKD stage III - Recent BMET stable.   3. HTN - Stable, as above.    4. 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 - Now seeing Dr. Marin Olp. 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  - Started on aranesp. - Referred to Palliative Care but patient refused.   Our Hf social worker spoke with him at length today to try and assist him with enrolling in medicare part D, disablility/social security check. He remains at high risk for readmission due to medication compliance, declining health. He has very poor insight into his health conditions. I  think ultimately he needs to be at a SNF. Follow up in 3 months.   Arbutus Leas, NP  03/08/17

## 2017-03-08 NOTE — Addendum Note (Signed)
Encounter addended by: Effie Berkshire, RN on: 03/08/2017 10:36 AM<BR>    Actions taken: Sign clinical note

## 2017-03-08 NOTE — Patient Instructions (Signed)
Follow up 3 months.  No changes at this time.  Take all medication as prescribed the day of your appointment. Bring all medications with you to your appointment.  Do the following things EVERYDAY: 1) Weigh yourself in the morning before breakfast. Write it down and keep it in a log. 2) Take your medicines as prescribed 3) Eat low salt foods-Limit salt (sodium) to 2000 mg per day.  4) Stay as active as you can everyday 5) Limit all fluids for the day to less than 2 liters

## 2017-03-08 NOTE — Progress Notes (Signed)
CSW met with patient in the clinic to discuss need for Medicare D application and referral to [facility]. Patient spoke at length about his declining health and need for help. CSW explained multiple options all of which patient has refused. Patient states "I don't want anyone getting in my business". CSW attempted to explain Medicare D process and there would be no need to get involved with his personal information as they already have the information. Patient would need to pick a plan and CSW offered to assist although patient states he would like to "think about it". CSW also offered to assist with coordination of needs with [facility] and [facility] although patient continual refuses services stating "too many people in my home and don't want them messing with my personal business". Patient shared a story of a family in [place] who was taken advantage of because they shared their personal information. Patient resistant to support services in the home as well as applications for resources which may assist with his current health needs. Patient states he is willing to consider assistance offered by CSW and will discuss at next clinic visit although  CSW explained that he is scheduled to return in January, 2019 and open enrollment for Medicare D will be complete by that time.  CSW will assist with taxi ride to urology appointment tomorrow and follow up per patient request. CSW provided card for return call if patient agreeable. Raquel Sarna, [place], [place]

## 2017-03-08 NOTE — Addendum Note (Signed)
Encounter addended by: Louann Liv, LCSW on: 03/08/2017  5:12 PM<BR>    Actions taken: Sign clinical note

## 2017-03-09 ENCOUNTER — Telehealth: Payer: Self-pay | Admitting: Licensed Clinical Social Worker

## 2017-03-09 DIAGNOSIS — R339 Retention of urine, unspecified: Secondary | ICD-10-CM | POA: Diagnosis not present

## 2017-03-09 DIAGNOSIS — N130 Hydronephrosis with ureteropelvic junction obstruction: Secondary | ICD-10-CM | POA: Diagnosis not present

## 2017-03-09 NOTE — Telephone Encounter (Signed)
CSW contacted [employer] and made arrangements for patient to be transported to [facility]. CSW informed patient of pick up time. Patient verbalizes understanding and will follow up with today's appointment. Raquel Sarna, [place], [place]

## 2017-03-10 DIAGNOSIS — N189 Chronic kidney disease, unspecified: Secondary | ICD-10-CM | POA: Diagnosis not present

## 2017-03-10 DIAGNOSIS — I5032 Chronic diastolic (congestive) heart failure: Secondary | ICD-10-CM | POA: Diagnosis not present

## 2017-03-10 DIAGNOSIS — Z87891 Personal history of nicotine dependence: Secondary | ICD-10-CM | POA: Diagnosis not present

## 2017-03-10 DIAGNOSIS — R339 Retention of urine, unspecified: Secondary | ICD-10-CM | POA: Diagnosis not present

## 2017-03-10 DIAGNOSIS — I130 Hypertensive heart and chronic kidney disease with heart failure and stage 1 through stage 4 chronic kidney disease, or unspecified chronic kidney disease: Secondary | ICD-10-CM | POA: Diagnosis not present

## 2017-03-16 DIAGNOSIS — R339 Retention of urine, unspecified: Secondary | ICD-10-CM | POA: Diagnosis not present

## 2017-03-16 DIAGNOSIS — I130 Hypertensive heart and chronic kidney disease with heart failure and stage 1 through stage 4 chronic kidney disease, or unspecified chronic kidney disease: Secondary | ICD-10-CM | POA: Diagnosis not present

## 2017-03-16 DIAGNOSIS — N189 Chronic kidney disease, unspecified: Secondary | ICD-10-CM | POA: Diagnosis not present

## 2017-03-16 DIAGNOSIS — Z87891 Personal history of nicotine dependence: Secondary | ICD-10-CM | POA: Diagnosis not present

## 2017-03-16 DIAGNOSIS — I5032 Chronic diastolic (congestive) heart failure: Secondary | ICD-10-CM | POA: Diagnosis not present

## 2017-03-17 ENCOUNTER — Telehealth: Payer: Self-pay | Admitting: Internal Medicine

## 2017-03-17 MED ORDER — HYDROCORTISONE 2.5 % EX CREA: TOPICAL_CREAM | Freq: Two times a day (BID) | CUTANEOUS | 1 refills | Status: DC

## 2017-03-17 NOTE — Telephone Encounter (Signed)
Rx sent 

## 2017-03-17 NOTE — Telephone Encounter (Signed)
Patient called and stts that the last time he was in the office. Dr. Larose Kells provided him with RX for some itching cream. Patient stts he lost the RX slip and would like for DR. Paz to submit it over to his local [facility] which is located on [address].   CALL BACK # [phone]

## 2017-03-19 DIAGNOSIS — I130 Hypertensive heart and chronic kidney disease with heart failure and stage 1 through stage 4 chronic kidney disease, or unspecified chronic kidney disease: Secondary | ICD-10-CM | POA: Diagnosis not present

## 2017-03-19 DIAGNOSIS — Z87891 Personal history of nicotine dependence: Secondary | ICD-10-CM | POA: Diagnosis not present

## 2017-03-19 DIAGNOSIS — N189 Chronic kidney disease, unspecified: Secondary | ICD-10-CM | POA: Diagnosis not present

## 2017-03-19 DIAGNOSIS — R339 Retention of urine, unspecified: Secondary | ICD-10-CM | POA: Diagnosis not present

## 2017-03-19 DIAGNOSIS — I5032 Chronic diastolic (congestive) heart failure: Secondary | ICD-10-CM | POA: Diagnosis not present

## 2017-03-23 DIAGNOSIS — N189 Chronic kidney disease, unspecified: Secondary | ICD-10-CM | POA: Diagnosis not present

## 2017-03-23 DIAGNOSIS — I5032 Chronic diastolic (congestive) heart failure: Secondary | ICD-10-CM | POA: Diagnosis not present

## 2017-03-23 DIAGNOSIS — R339 Retention of urine, unspecified: Secondary | ICD-10-CM | POA: Diagnosis not present

## 2017-03-23 DIAGNOSIS — I130 Hypertensive heart and chronic kidney disease with heart failure and stage 1 through stage 4 chronic kidney disease, or unspecified chronic kidney disease: Secondary | ICD-10-CM | POA: Diagnosis not present

## 2017-03-23 DIAGNOSIS — Z87891 Personal history of nicotine dependence: Secondary | ICD-10-CM | POA: Diagnosis not present

## 2017-03-25 ENCOUNTER — Telehealth: Payer: Self-pay | Admitting: Hematology & Oncology

## 2017-03-25 NOTE — Telephone Encounter (Signed)
sw pt to confirm r/s new pt appt to 11/13 at 230 pm due to Dr E on [facility]

## 2017-03-29 DIAGNOSIS — I5032 Chronic diastolic (congestive) heart failure: Secondary | ICD-10-CM | POA: Diagnosis not present

## 2017-03-29 DIAGNOSIS — I130 Hypertensive heart and chronic kidney disease with heart failure and stage 1 through stage 4 chronic kidney disease, or unspecified chronic kidney disease: Secondary | ICD-10-CM | POA: Diagnosis not present

## 2017-03-29 DIAGNOSIS — N189 Chronic kidney disease, unspecified: Secondary | ICD-10-CM | POA: Diagnosis not present

## 2017-03-29 DIAGNOSIS — R339 Retention of urine, unspecified: Secondary | ICD-10-CM | POA: Diagnosis not present

## 2017-03-29 DIAGNOSIS — Z87891 Personal history of nicotine dependence: Secondary | ICD-10-CM | POA: Diagnosis not present

## 2017-03-30 ENCOUNTER — Encounter: Payer: Self-pay | Admitting: Family

## 2017-03-30 ENCOUNTER — Telehealth: Payer: Self-pay | Admitting: Internal Medicine

## 2017-03-30 ENCOUNTER — Ambulatory Visit (HOSPITAL_BASED_OUTPATIENT_CLINIC_OR_DEPARTMENT_OTHER): Payer: Medicare Other | Admitting: Family

## 2017-03-30 ENCOUNTER — Other Ambulatory Visit: Payer: Self-pay

## 2017-03-30 ENCOUNTER — Other Ambulatory Visit (HOSPITAL_BASED_OUTPATIENT_CLINIC_OR_DEPARTMENT_OTHER): Payer: Medicare Other

## 2017-03-30 VITALS — BP 162/68 | HR 73 | Temp 97.6°F | Resp 18 | Wt 174.0 lb

## 2017-03-30 DIAGNOSIS — D631 Anemia in chronic kidney disease: Secondary | ICD-10-CM

## 2017-03-30 DIAGNOSIS — E611 Iron deficiency: Secondary | ICD-10-CM | POA: Diagnosis not present

## 2017-03-30 DIAGNOSIS — K909 Intestinal malabsorption, unspecified: Secondary | ICD-10-CM

## 2017-03-30 DIAGNOSIS — D649 Anemia, unspecified: Secondary | ICD-10-CM

## 2017-03-30 DIAGNOSIS — D471 Chronic myeloproliferative disease: Secondary | ICD-10-CM

## 2017-03-30 DIAGNOSIS — N189 Chronic kidney disease, unspecified: Secondary | ICD-10-CM | POA: Diagnosis not present

## 2017-03-30 DIAGNOSIS — D508 Other iron deficiency anemias: Secondary | ICD-10-CM

## 2017-03-30 DIAGNOSIS — D500 Iron deficiency anemia secondary to blood loss (chronic): Secondary | ICD-10-CM

## 2017-03-30 LAB — CBC WITH DIFFERENTIAL (CANCER CENTER ONLY)
BASO#: 0.4 10*3/uL — ABNORMAL HIGH (ref 0.0–0.2)
BASO%: 3.5 % — AB (ref 0.0–2.0)
EOS%: 1.6 % (ref 0.0–7.0)
Eosinophils Absolute: 0.2 10*3/uL (ref 0.0–0.5)
HCT: 34.1 % — ABNORMAL LOW (ref 38.7–49.9)
HGB: 10.7 g/dL — ABNORMAL LOW (ref 13.0–17.1)
LYMPH#: 1.4 10*3/uL (ref 0.9–3.3)
LYMPH%: 14 % (ref 14.0–48.0)
MCH: 26.9 pg — ABNORMAL LOW (ref 28.0–33.4)
MCHC: 31.4 g/dL — ABNORMAL LOW (ref 32.0–35.9)
MCV: 86 fL (ref 82–98)
MONO#: 2.1 10*3/uL — ABNORMAL HIGH (ref 0.1–0.9)
MONO%: 20.7 % — ABNORMAL HIGH (ref 0.0–13.0)
NEUT#: 6.1 10*3/uL (ref 1.5–6.5)
NEUT%: 60.2 % (ref 40.0–80.0)
Platelets: 287 10*3/uL (ref 145–400)
RBC: 3.98 10*6/uL — ABNORMAL LOW (ref 4.20–5.70)
RDW: 18.1 % — AB (ref 11.1–15.7)
WBC: 10.2 10*3/uL — ABNORMAL HIGH (ref 4.0–10.0)

## 2017-03-30 LAB — CMP (CANCER CENTER ONLY)
ALT(SGPT): 22 U/L (ref 10–47)
AST: 24 U/L (ref 11–38)
Albumin: 2.8 g/dL — ABNORMAL LOW (ref 3.3–5.5)
Alkaline Phosphatase: 116 U/L — ABNORMAL HIGH (ref 26–84)
BUN: 38 mg/dL — AB (ref 7–22)
CALCIUM: 9.8 mg/dL (ref 8.0–10.3)
CHLORIDE: 110 meq/L — AB (ref 98–108)
CO2: 27 mEq/L (ref 18–33)
Creat: 1.3 mg/dl — ABNORMAL HIGH (ref 0.6–1.2)
GLUCOSE: 106 mg/dL (ref 73–118)
POTASSIUM: 4.3 meq/L (ref 3.3–4.7)
SODIUM: 143 meq/L (ref 128–145)
Total Bilirubin: 0.4 mg/dl (ref 0.20–1.60)
Total Protein: 6 g/dL — ABNORMAL LOW (ref 6.4–8.1)

## 2017-03-30 LAB — TECHNOLOGIST REVIEW CHCC SATELLITE

## 2017-03-30 NOTE — Telephone Encounter (Signed)
So that pt can have someone to come to his home and have his oxigen checked and changed to a new one. Pt stated that nurse that visits him recommended that he contacted his provider to have this requested for him so that [facility] can verify his equipment. Please advise.

## 2017-03-30 NOTE — Telephone Encounter (Signed)
Please advise 

## 2017-03-30 NOTE — Telephone Encounter (Signed)
Ok to precede w/ [facility] checking his O2 equipment

## 2017-03-30 NOTE — Progress Notes (Signed)
Hematology/Oncology Consultation   Name: Juan [name]      MRN: [id number]    Location: Room/bed info not found  Date: 03/30/2017 Time:2:53 PM   REFERRING PHYSICIAN: [name], MD  REASON FOR CONSULT: Myeloproliferative disease    DIAGNOSIS:  Myeloproliferative disease Erythropoietin deficiency anemia   HISTORY OF PRESENT ILLNESS: Juan [name] is a very pleasant 76 yo gentleman previously seen by Dr. Marin Olp for myeloproliferative disorder. He was hospitalized in September with an acute UTI with pseudomonas, CHF and gout. He was found to be anemic with a low erythropoietin level. He received [name] during admission and has responded nicely.  Hgb today is 10.7 with an MCV of 86.  He denies any bleeding, bruising or petechiae. No lymphadenopathy found on exam.  His SOB is much better. He is still on 2L [place] supplemental O2 24 hours a day. He states that home health is evaluating him soon to see if he can discontinue this.  He has had chills, occasional palpitations and one episodes of n/v a few weeks ago.  His rash has improved with the use of hydrocortisone cream.  No known family history of cancer.  His ECHO in May showed an EF of 55-60%.  No fever, cough, rash, dizziness, chest pain, abdominal pain or changes in bowel or bladder habits.  He is now on Flomax which he states has helped with his urinary retention.  He has some puffiness in his feet and ankles that comes and goes. He takes Lasix 40 mg PO daily.  The numbness and tingling in his fingertips is unchanged. No c/o pain at this time.  He walks at home with a walker as tolerated. He denies falls or syncope.  He has a good appetite and is staying well hydrated. His weight is stable.  He is not a smoker and does not drink alcohol.   ROS: All other 10 point review of systems is negative.   PAST MEDICAL HISTORY:   Past Medical History:  Diagnosis Date  . Allergic rhinitis   . Anemia   . Ascending aortic aneurysm ([place]) 03/2014   4.3cm  on CT scan  . CAD (coronary artery disease)    dx elsewheer in past, no documentation. Non-ischemic myoview 2007  . Chronic diastolic CHF (congestive heart failure), NYHA class 2 (HCC)    Normal EF w/ grade 1 dd by echo 12/2015  . Edema    R>L leg, u/s 5-12 neg for DVT  . Hemorrhoid   . History of thrombocytosis   . Hypertension   . Migraine    "once/wk at least" (07/11/2013)  . Myeloproliferative disease ([place])   . Shortness of breath   . Sinus congestion   . Sleep apnea, obstructive    at some point used CPAP, was d/c  years ago  . Type II diabetes mellitus (HCC)     ALLERGIES: No Known Allergies    MEDICATIONS:  Current Outpatient Medications on File Prior to Visit  Medication Sig Dispense Refill  . acetaminophen (TYLENOL) 325 MG tablet Take 2 tablets (650 mg total) by mouth 4 (four) times daily.    Marland Kitchen aspirin 325 MG tablet Take 325 mg by mouth daily.    Marland Kitchen b complex vitamins tablet Take 1 tablet by mouth daily.    . diclofenac sodium (VOLTAREN) 1 % GEL Apply 4 g topically 4 (four) times daily. Apply to bilateral  knees 1 Tube 0  . feeding supplement, GLUCERNA SHAKE, (GLUCERNA SHAKE) LIQD Take 118.5 mLs by mouth  daily. Reported on 08/21/2015    . ferrous gluconate (FERGON) 240 (27 FE) MG tablet Take 240 mg by mouth daily.    . fluticasone (FLONASE) 50 MCG/ACT nasal spray Place 2 sprays into both nostrils daily. Reported on 05/02/2015    . furosemide (LASIX) 40 MG tablet Take 40 mg by mouth daily.    Marland Kitchen gabapentin (NEURONTIN) 100 MG capsule Take 2 capsules (200 mg total) by mouth 3 (three) times daily as needed (pain). Reported on 08/22/2015 60 capsule 0  . hydrALAZINE (APRESOLINE) 25 MG tablet Take 1 tablet (25 mg total) by mouth 3 (three) times daily. 90 tablet 2  . hydrocerin (EUCERIN) CREA Apply Eucerin cream to BLE Q day after bathing and roughly towel drying to remove loose skin (Patient taking differently: Apply 1 application topically daily as needed (dryness). ) 228 g 0  .  hydrocortisone 2.5 % cream Apply topically 2 (two) times daily. 30 g 1  . hydrOXYzine (ATARAX/VISTARIL) 10 MG tablet Take 1 tablet (10 mg total) by mouth every 4 (four) hours as needed for itching. 12 tablet 0  . OXYGEN Inhale 2 L into the lungs continuous.    . polyethylene glycol (MIRALAX / GLYCOLAX) packet Take 17 g by mouth daily as needed for mild constipation. 14 each 0  . potassium chloride SA (K-DUR,KLOR-CON) 20 MEQ tablet Take 20 mEq by mouth as needed (when congested).     No current facility-administered medications on file prior to visit.      PAST SURGICAL HISTORY Past Surgical History:  Procedure Laterality Date  . TOE SURGERY Right    "tried to straighten out big toe" (07/11/2013)    FAMILY HISTORY: Family History  Problem Relation Age of Onset  . Schizophrenia Son   . Colon cancer Neg Hx   . Prostate cancer Neg Hx   . Heart attack Neg Hx   . Diabetes Neg Hx     SOCIAL HISTORY:  reports that he quit smoking about 50 years ago. His smoking use included cigarettes. He has a 3.00 pack-year smoking history. he has never used smokeless tobacco. He reports that he does not drink alcohol or use drugs.  PERFORMANCE STATUS: The patient's performance status is 2 - Symptomatic, <50% confined to bed  PHYSICAL EXAM: Most Recent Vital Signs: There were no vitals taken for this visit. BP (!) 162/68 (BP Location: Left Arm, Patient Position: Sitting)   Pulse 73   Temp 97.6 F (36.4 C) (Oral)   Resp 18   Wt 174 lb (78.9 kg) Comment: last weight, pt. could not stand for weight today  SpO2 100%   BMI 21.75 kg/m   General Appearance:    Alert, cooperative, no distress, appears stated age  Head:    Normocephalic, without obvious abnormality, atraumatic  Eyes:    PERRL, conjunctiva/corneas clear, EOM's intact, fundi    benign, both eyes             Throat:   Lips, mucosa, and tongue normal; teeth and gums normal  Neck:   Supple, symmetrical, trachea midline, no adenopathy;        thyroid:  No enlargement/tenderness/nodules; no carotid   bruit or JVD  Back:     Symmetric, no curvature, ROM normal, no CVA tenderness  Lungs:     Clear to auscultation bilaterally, respirations unlabored  Chest wall:    No tenderness or deformity  Heart:    Regular rate and rhythm, S1 and S2 normal, no murmur, rub  or gallop  Abdomen:     Soft, non-tender, bowel sounds active all four quadrants,    no masses, no organomegaly        Extremities:   Extremities normal, atraumatic, no cyanosis or edema  Pulses:   2+ and symmetric all extremities  Skin:   Skin color, texture, turgor normal, no rashes or lesions  Lymph nodes:   Cervical, supraclavicular, and axillary nodes normal  Neurologic:   CNII-XII intact. Normal strength, sensation and reflexes      throughout    LABORATORY DATA:  Results for orders placed or performed in visit on 03/30/17 (from the past 48 hour(s))  CBC with Differential     Status: Abnormal   Collection Time: 03/30/17  2:20 PM  Result Value Ref Range   WBC 10.2 (H) 4.0 - 10.0 10e3/uL   RBC 3.98 (L) 4.20 - 5.70 10e6/uL   HGB 10.7 (L) 13.0 - 17.1 g/dL   HCT 34.1 (L) 38.7 - 49.9 %   MCV 86 82 - 98 fL   MCH 26.9 (L) 28.0 - 33.4 pg   MCHC 31.4 (L) 32.0 - 35.9 g/dL   RDW 18.1 (H) 11.1 - 15.7 %   Platelets 287 Large platelets present 145 - 400 10e3/uL   NEUT# 6.1 1.5 - 6.5 10e3/uL   LYMPH# 1.4 0.9 - 3.3 10e3/uL   MONO# 2.1 (H) 0.1 - 0.9 10e3/uL   Eosinophils Absolute 0.2 0.0 - 0.5 10e3/uL   BASO# 0.4 (H) 0.0 - 0.2 10e3/uL   NEUT% 60.2 40.0 - 80.0 %   LYMPH% 14.0 14.0 - 48.0 %   MONO% 20.7 (H) 0.0 - 13.0 %   EOS% 1.6 0.0 - 7.0 %   BASO% 3.5 (H) 0.0 - 2.0 %  TECHNOLOGIST REVIEW [facility]     Status: None   Collection Time: 03/30/17  2:20 PM  Result Value Ref Range   Tech Review      Metas and Myelocytes present. 1% blast. few ovalo, shisto, teardrops      RADIOGRAPHY: No results found.     PATHOLOGY: None   ASSESSMENT/PLAN: Juan [name] is a  very pleasant 76 yo gentleman previously seen by Dr. Marin Olp for myeloproliferative disorder and now recent diagnosis of erythropoietin deficiency anemia.  He received [name] during his hospital admission in September and had a nice response. His Hgb today is 10.7 so he will not require an injection this visit.  We will plan to see him back again in another 2 months for repeat lab work and follow-up.   All questions were answered and he is in agreement with the plan. Both he and his son know to contact our office with any questions or concerns. We can certainly see him much sooner if needed.  He was discussed with and also seen by Dr. Marin Olp and he is in agreement with the aforementioned.   [facility]     Addendum: I saw and examined the patient with Rockey Guarino.  I agree with the above assessment.  I am absolutely amazed that Juan [name] is still with this.  For as long as I have known him, I thought for sure that he would have transformed into acute myeloid leukemia.  The fact that he has not, as confirmed by his last bone marrow test, is truly phenomenal.  His blood counts just do not look that bad.  I looked at his blood under the microscope.  I just was not that impressed with seen a lot of  immaturity.  I do not see any nucleated red blood cells.  He had no blasts.  This is all about quality of life.  This is all about making sure that he can have a decent life style.  For now, I think his overall prognosis is based on his other health issues and not on his blood.  I think we can probably get him back after the holidays.  I would like to get him back after the winter if possible.  Of note, he does have a markedly low saturation.  It is possible that IV iron might help him out.  We will talk about this.  Again, IV iron might actually be reasonable to try with him.  We spent about 40 minutes with him.  He is a very strong faith.  Lattie Haw, MD

## 2017-03-30 NOTE — Telephone Encounter (Signed)
Caller name: Firas  Relation to pt: self Call back number: [phone] Pharmacy:  Reason for call: Pt came in office stating is need provider to send a letter to [facility] so that pt can have his oxigine off oxigen

## 2017-03-31 ENCOUNTER — Encounter: Payer: Self-pay | Admitting: Family

## 2017-03-31 DIAGNOSIS — D500 Iron deficiency anemia secondary to blood loss (chronic): Secondary | ICD-10-CM

## 2017-03-31 DIAGNOSIS — K909 Intestinal malabsorption, unspecified: Secondary | ICD-10-CM | POA: Insufficient documentation

## 2017-03-31 HISTORY — DX: Iron deficiency anemia secondary to blood loss (chronic): D50.0

## 2017-03-31 HISTORY — DX: Intestinal malabsorption, unspecified: K90.9

## 2017-03-31 LAB — IRON AND TIBC
%SAT: 8 % — ABNORMAL LOW (ref 20–55)
Iron: 14 ug/dL — ABNORMAL LOW (ref 42–163)
TIBC: 179 ug/dL — ABNORMAL LOW (ref 202–409)
UIBC: 165 ug/dL (ref 117–376)

## 2017-03-31 LAB — FERRITIN: Ferritin: 434 ng/ml — ABNORMAL HIGH (ref 22–316)

## 2017-03-31 LAB — RETICULOCYTES: Reticulocyte Count: 2.2 % (ref 0.6–2.6)

## 2017-04-01 NOTE — Telephone Encounter (Signed)
Spoke w/ Lattie Haw at [facility] [phone], she informed that the Pt actually called them on 03/29/2017 wanting to be removed off of his oxygen therapy. They informed that he will need to call our office to discuss for an order or he must sign against medical advice. Informed Lattie Haw that I would inform PCP.

## 2017-04-02 ENCOUNTER — Other Ambulatory Visit: Payer: Self-pay

## 2017-04-02 ENCOUNTER — Ambulatory Visit: Payer: Self-pay | Admitting: Hematology & Oncology

## 2017-04-02 NOTE — Telephone Encounter (Signed)
I believe he needs the oxygen, it will have to be against my advice.

## 2017-04-02 NOTE — Telephone Encounter (Signed)
Noted  

## 2017-04-06 MED FILL — hydrALAZINE HCL 25 MG TABS: 25 | 30 days supply | Qty: 90 | Fill #1

## 2017-04-06 MED FILL — FUROSEMIDE 40 MG TAB: 40 | 33 days supply | Qty: 100 | Fill #1

## 2017-04-12 ENCOUNTER — Emergency Department (HOSPITAL_COMMUNITY)
Admission: EM | Admit: 2017-04-12 | Discharge: 2017-04-12 | Disposition: A | Discharge status: Home / Self Care | Payer: Medicare Other | Attending: Emergency Medicine | Admitting: Emergency Medicine

## 2017-04-12 ENCOUNTER — Encounter (HOSPITAL_COMMUNITY): Payer: Self-pay | Admitting: *Deleted

## 2017-04-12 ENCOUNTER — Other Ambulatory Visit: Payer: Self-pay

## 2017-04-12 DIAGNOSIS — Z87891 Personal history of nicotine dependence: Secondary | ICD-10-CM | POA: Diagnosis not present

## 2017-04-12 DIAGNOSIS — N183 Chronic kidney disease, stage 3 (moderate): Secondary | ICD-10-CM | POA: Insufficient documentation

## 2017-04-12 DIAGNOSIS — Z7952 Long term (current) use of systemic steroids: Secondary | ICD-10-CM | POA: Insufficient documentation

## 2017-04-12 DIAGNOSIS — I2510 Atherosclerotic heart disease of native coronary artery without angina pectoris: Secondary | ICD-10-CM | POA: Diagnosis not present

## 2017-04-12 DIAGNOSIS — R509 Fever, unspecified: Secondary | ICD-10-CM | POA: Diagnosis not present

## 2017-04-12 DIAGNOSIS — Z79899 Other long term (current) drug therapy: Secondary | ICD-10-CM | POA: Diagnosis not present

## 2017-04-12 DIAGNOSIS — E119 Type 2 diabetes mellitus without complications: Secondary | ICD-10-CM | POA: Diagnosis not present

## 2017-04-12 DIAGNOSIS — Z7982 Long term (current) use of aspirin: Secondary | ICD-10-CM | POA: Diagnosis not present

## 2017-04-12 DIAGNOSIS — N139 Obstructive and reflux uropathy, unspecified: Secondary | ICD-10-CM | POA: Diagnosis not present

## 2017-04-12 DIAGNOSIS — N390 Urinary tract infection, site not specified: Secondary | ICD-10-CM | POA: Diagnosis not present

## 2017-04-12 DIAGNOSIS — Z79811 Long term (current) use of aromatase inhibitors: Secondary | ICD-10-CM | POA: Diagnosis not present

## 2017-04-12 DIAGNOSIS — R300 Dysuria: Secondary | ICD-10-CM | POA: Diagnosis present

## 2017-04-12 DIAGNOSIS — Z7984 Long term (current) use of oral hypoglycemic drugs: Secondary | ICD-10-CM | POA: Insufficient documentation

## 2017-04-12 DIAGNOSIS — I110 Hypertensive heart disease with heart failure: Secondary | ICD-10-CM | POA: Diagnosis not present

## 2017-04-12 DIAGNOSIS — I5032 Chronic diastolic (congestive) heart failure: Secondary | ICD-10-CM | POA: Insufficient documentation

## 2017-04-12 DIAGNOSIS — S3994XA Unspecified injury of external genitals, initial encounter: Secondary | ICD-10-CM | POA: Diagnosis not present

## 2017-04-12 LAB — COMPREHENSIVE METABOLIC PANEL
ALK PHOS: 112 U/L (ref 38–126)
ALT: 17 U/L (ref 17–63)
AST: 28 U/L (ref 15–41)
Albumin: 2.9 g/dL — ABNORMAL LOW (ref 3.5–5.0)
Anion gap: 9 (ref 5–15)
BILIRUBIN TOTAL: 0.7 mg/dL (ref 0.3–1.2)
BUN: 36 mg/dL — AB (ref 6–20)
CALCIUM: 9.8 mg/dL (ref 8.9–10.3)
CHLORIDE: 106 mmol/L (ref 101–111)
CO2: 22 mmol/L (ref 22–32)
CREATININE: 1.26 mg/dL — AB (ref 0.61–1.24)
GFR, EST NON AFRICAN AMERICAN: 54 mL/min — AB (ref >60)
Glucose, Bld: 114 mg/dL — ABNORMAL HIGH (ref 65–99)
Potassium: 4.1 mmol/L (ref 3.5–5.1)
Sodium: 137 mmol/L (ref 135–145)
TOTAL PROTEIN: 6.1 g/dL — AB (ref 6.5–8.1)

## 2017-04-12 LAB — CBC
HCT: 36.8 % — ABNORMAL LOW (ref 39.0–52.0)
Hemoglobin: 11.4 g/dL — ABNORMAL LOW (ref 13.0–17.0)
MCH: 25.8 pg — ABNORMAL LOW (ref 26.0–34.0)
MCHC: 31 g/dL (ref 30.0–36.0)
MCV: 83.3 fL (ref 78.0–100.0)
PLATELETS: 386 10*3/uL (ref 150–400)
RBC: 4.42 MIL/uL (ref 4.22–5.81)
RDW: 17.7 % — AB (ref 11.5–15.5)
WBC: 14.4 10*3/uL — AB (ref 4.0–10.5)

## 2017-04-12 LAB — URINALYSIS, ROUTINE W REFLEX MICROSCOPIC
Bilirubin Urine: NEGATIVE
Glucose, UA: NEGATIVE mg/dL
HGB URINE DIPSTICK: NEGATIVE
Ketones, ur: NEGATIVE mg/dL
Nitrite: NEGATIVE
PH: 6 (ref 5.0–8.0)
Protein, ur: 100 mg/dL — AB
SPECIFIC GRAVITY, URINE: 1.011 (ref 1.005–1.030)

## 2017-04-12 MED ORDER — PHENAZOPYRIDINE HCL 100 MG PO TABS
200.0000 mg | ORAL_TABLET | Freq: Once | ORAL | Status: AC
  Administered 2017-04-12: 200 mg via ORAL
  Filled 2017-04-12: qty 2

## 2017-04-12 MED ORDER — CEPHALEXIN 250 MG PO CAPS
1000.0000 mg | ORAL_CAPSULE | Freq: Once | ORAL | Status: AC
  Administered 2017-04-12: 1000 mg via ORAL
  Filled 2017-04-12: qty 4

## 2017-04-12 MED ORDER — CEPHALEXIN 500 MG PO CAPS: 500.0000 mg | ORAL_CAPSULE | Freq: Four times a day (QID) | ORAL | 0 refills | Status: DC

## 2017-04-12 MED ORDER — PHENAZOPYRIDINE HCL 200 MG PO TABS: 200.0000 mg | ORAL_TABLET | Freq: Three times a day (TID) | ORAL | 0 refills | Status: DC

## 2017-04-12 NOTE — ED Triage Notes (Signed)
The pt arrived by ptar  From home the pt had a foley removed 2 weeks ago  Groin pain and some bleeding with urination.  The pt lives with his son who reports to paramedics that he will have a ride home if he needs it

## 2017-04-12 NOTE — Discharge Planning (Signed)
[facility] consulted in regards to medication assistance.  Pt has insurance coverage (Medicare) and is not eligible for [facility] program or additional medication discounts.

## 2017-04-12 NOTE — Discharge Instructions (Signed)
Your urine has been sent for culture.  Culture will come back in 2 days and will indicate what germ is causing the infection and what antibiotics are effective against it.  If the culture shows that we need to change antibiotics, we will contact you.  However, if you start developing a fever or start vomiting, you should return to the emergency department.  If that happens, you might need to be admitted.

## 2017-04-12 NOTE — Discharge Planning (Signed)
EDCM spoke with pt at bedside to find that pt will receive his check in a few days and will be able to purchase medications at that time. No further EDCM needs identified at this time.

## 2017-04-12 NOTE — ED Provider Notes (Signed)
[facility] EMERGENCY DEPARTMENT Provider Note   CSN: [id number] Arrival date & time: 04/12/17  0055     History   Chief Complaint Chief Complaint  Patient presents with  . Groin Pain    HPI Juan [name] is a 76 y.o. male.  The history is provided by the patient.  He has a history of myeloproliferative disease, diastolic heart failure, hypertension.  He comes in with dysuria and urinary urgency and frequency over the last 7-10 days.  Symptoms are gotten worse over the last 2 days.  He has had subjective fever as well as chills and sweats.  He denies any abdominal pain or flank pain.  He denies nausea or vomiting.  He states he had a Foley catheter removed about 6 weeks ago and had been on some medication following removal of the catheter.  He is concerned that he has urine infection.  Past Medical History:  Diagnosis Date  . Allergic rhinitis   . Anemia   . Ascending aortic aneurysm ([place]) 03/2014   4.3cm on CT scan  . CAD (coronary artery disease)    dx elsewheer in past, no documentation. Non-ischemic myoview 2007  . Chronic diastolic CHF (congestive heart failure), NYHA class 2 (HCC)    Normal EF w/ grade 1 dd by echo 12/2015  . Edema    R>L leg, u/s 5-12 neg for DVT  . Hemorrhoid   . History of thrombocytosis   . Hypertension   . Iron deficiency anemia due to chronic blood loss 03/31/2017  . 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 Migraine    "once/wk at least" (07/11/2013)  . Myeloproliferative disease ([place])   . Shortness of breath   . Sinus congestion   . Sleep apnea, obstructive    at some point used CPAP, was d/c  years ago  . Type II diabetes mellitus [facility])     Patient Active Problem List   Diagnosis Date Noted  . Iron deficiency anemia due to chronic blood loss 03/31/2017  . 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 Diffuse pain 01/26/2017  . Infection due to urethral catheter ([place])   . Urinary retention 01/24/2017  . Right hip pain 12/24/2016  .  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 Muscle spasm 11/16/2016  . Neck pain 11/14/2016  . Elevated troponin 11/14/2016  . Arthritis   . Recurrent pneumonia 10/31/2016  . Acute extremity pain 10/31/2016  . 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 Pulmonary hypertension severe   . Sprain of left rotator cuff capsule   . Hyperkalemia   . Acute kidney injury superimposed on CKD ([place])   . Fever 10/08/2016  . Left arm pain 10/08/2016  . Paroxysmal SVT (supraventricular tachycardia) ([place]) 04/20/2016  . Type II diabetes mellitus ([place]) 04/20/2016  . Gout 04/20/2016  . Hyperuricemia 04/20/2016  . B12 deficiency 04/20/2016  . 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 Hydronephrosis   . Thrombocytosis ([place]) 04/07/2016  . Anemia 04/07/2016  . Pressure injury of skin 04/01/2016  . Pressure ulcer stage II 12/31/2015  . Sepsis ([place]) 12/29/2015  . Acute lower UTI 12/29/2015  . T wave inversion in EKG 12/29/2015  . PCP NOTES >>>>>>>>>>>>>>>>> 05/02/2015  . Arm pain   . Peripheral edema   . Unable to ambulate   . Thoracic aortic aneurysm ([place]) 04/26/2014  . CKD (chronic kidney disease) stage 3, GFR 30-59 ml/min (HCC) 04/26/2014  . 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 Chest pain 04/15/2014  .  Encounter for palliative care 04/06/2014  . Generalized weakness 04/06/2014  . Venous stasis dermatitis of both lower extremities   . Malnutrition of moderate degree ([place]) 03/31/2014  . Morbid obesity due to excess calories ([place]) 03/29/2014  . 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 Poor compliance with advise  05/05/2012  . Annual physical exam 01/14/2012  . Weight loss 01/14/2012  . *Chronic lower extremity edema, stasis dermatitis 06/09/2011  . Symptomatic anemia 09/07/2006  . Myeloproliferative disease ([place]) 09/07/2006  . Obstructive sleep apnea 09/07/2006  . Essential hypertension 09/07/2006  . Coronary atherosclerosis 09/07/2006  . Chronic  diastolic CHF (congestive heart failure) ([place]) 09/07/2006  . Allergic rhinitis 09/07/2006    Past Surgical History:  Procedure Laterality Date  . TOE SURGERY Right    "tried to straighten out big toe" (07/11/2013)       Home Medications    Prior to Admission medications   Medication Sig Start Date End Date Taking? Authorizing Provider  acetaminophen (TYLENOL) 325 MG tablet Take 2 tablets (650 mg total) by mouth 4 (four) times daily. 01/26/17   Debbe Odea, MD  aspirin 325 MG tablet Take 325 mg by mouth daily.    [provider]  b complex vitamins tablet Take 1 tablet by mouth daily.    [provider]  diclofenac sodium (VOLTAREN) 1 % GEL Apply 4 g topically 4 (four) times daily. Apply to bilateral  knees 01/26/17   Debbe Odea, MD  feeding supplement, GLUCERNA SHAKE, (GLUCERNA SHAKE) LIQD Take 118.5 mLs by mouth daily. Reported on 08/21/2015    [provider]  ferrous gluconate (FERGON) 240 (27 FE) MG tablet Take 240 mg by mouth daily.    [provider]  fluticasone (FLONASE) 50 MCG/ACT nasal spray Place 2 sprays into both nostrils daily. Reported on 05/02/2015    [provider]  furosemide (LASIX) 40 MG tablet Take 40 mg by mouth daily.    [provider]  gabapentin (NEURONTIN) 100 MG capsule Take 2 capsules (200 mg total) by mouth 3 (three) times daily as needed (pain). Reported on 08/22/2015 10/17/16   Rama, Venetia Maxon, MD  hydrALAZINE (APRESOLINE) 25 MG tablet Take 1 tablet (25 mg total) by mouth 3 (three) times daily. 02/09/17 05/10/17  Arbutus Leas, NP  hydrocerin (EUCERIN) CREA Apply Eucerin cream to BLE Q day after bathing and roughly towel drying to remove loose skin Patient taking differently: Apply 1 application topically daily as needed (dryness).  01/27/14   Ghimire, Henreitta Leber, MD  hydrocortisone 2.5 % cream Apply topically 2 (two) times daily. 03/17/17 03/17/18  Colon Branch, MD  hydrOXYzine (ATARAX/VISTARIL) 10 MG  tablet Take 1 tablet (10 mg total) by mouth every 4 (four) hours as needed for itching. 01/11/17   Gareth Morgan, MD  OXYGEN Inhale 2 L into the lungs continuous.    [provider]  polyethylene glycol (MIRALAX / GLYCOLAX) packet Take 17 g by mouth daily as needed for mild constipation. 01/26/17   Debbe Odea, MD  potassium chloride SA (K-DUR,KLOR-CON) 20 MEQ tablet Take 20 mEq by mouth as needed (when congested).    [provider]    Family History Family History  Problem Relation Age of Onset  . Schizophrenia Son   . Colon cancer Neg Hx   . Prostate cancer Neg Hx   . Heart attack Neg Hx   . Diabetes Neg Hx     Social History Social History   Tobacco Use  . Smoking  status: Former Smoker    Packs/day: 0.25    Years: 12.00    Pack years: 3.00    Types: Cigarettes    Last attempt to quit: 05/18/1966    Years since quitting: 50.9  . Smokeless tobacco: Never Used  . Tobacco comment: quit smoking 45 years ago  Substance Use Topics  . Alcohol use: No    Alcohol/week: 0.0 oz  . Drug use: No     Allergies   Patient has no known allergies.   Review of Systems Review of Systems  All other systems reviewed and are negative.    Physical Exam Updated Vital Signs BP (!) 159/81 (BP Location: Right Arm)   Pulse 93   Temp 98.3 F (36.8 C) (Oral)   Resp 18   Wt 78.9 kg (174 lb)   SpO2 96%   BMI 21.75 kg/m   Physical Exam  Nursing note and vitals reviewed.  76 year old male, resting comfortably and in no acute distress. Vital signs are significant for hypertension. Oxygen saturation is 96%, which is normal. Head is normocephalic and atraumatic. PERRLA, EOMI. Oropharynx is clear. Neck is nontender and supple without adenopathy or JVD. Back is nontender and there is no CVA tenderness. Lungs are clear without rales, wheezes, or rhonchi. Chest is nontender. Heart has regular rate and rhythm without murmur. Abdomen is soft, flat, nontender without  masses or hepatosplenomegaly and peristalsis is normoactive. Extremities have no cyanosis or edema, full range of motion is present. Skin is warm and dry without rash. Neurologic: Mental status is normal, cranial nerves are intact, there are no motor or sensory deficits.  ED Treatments / Results  Labs (all labs ordered are listed, but only abnormal results are displayed) Labs Reviewed  COMPREHENSIVE METABOLIC PANEL - Abnormal; Notable for the following components:      Result Value   Glucose, Bld 114 (*)    BUN 36 (*)    Creatinine, Ser 1.26 (*)    Total Protein 6.1 (*)    Albumin 2.9 (*)    GFR calc non Af Amer 54 (*)    All other components within normal limits  CBC - Abnormal; Notable for the following components:   WBC 14.4 (*)    Hemoglobin 11.4 (*)    HCT 36.8 (*)    MCH 25.8 (*)    RDW 17.7 (*)    All other components within normal limits  URINALYSIS, ROUTINE W REFLEX MICROSCOPIC - Abnormal; Notable for the following components:   APPearance CLOUDY (*)    Protein, ur 100 (*)    Leukocytes, UA LARGE (*)    Bacteria, UA MANY (*)    Squamous Epithelial / LPF 0-5 (*)    All other components within normal limits  URINE CULTURE    Procedures Procedures (including critical care time)  Medications Ordered in ED Medications  cephALEXin (KEFLEX) capsule 1,000 mg (not administered)  phenazopyridine (PYRIDIUM) tablet 200 mg (not administered)     Initial Impression / Assessment and Plan / ED Course  I have reviewed the triage vital signs and the nursing notes.  Pertinent labs & imaging results that were available during my care of the patient were reviewed by me and considered in my medical decision making (see chart for details).  Symptoms strongly suggestive of urinary tract infection.  Old records reviewed confirming a history of myeloproliferative disorder and anemia which is well controlled with erythropoietin injections.  WBC is mildly to moderately elevated,  hemoglobin is slightly  increased over baseline.  He has mild renal insufficiency which is unchanged from baseline.  Urinalysis is pending.  Urinalysis is consistent with UTI.  Specimen is sent for culture.  On review of his records, last urinary tract infection in September was with Pseudomonas.  That was adequately treated.  He will be discharged with prescription for cephalexin and will adjust antibiotic according to culture results.  Return precautions discussed.  Final Clinical Impressions(s) / ED Diagnoses   Final diagnoses:  Urinary tract infection without hematuria, site unspecified    ED Discharge Orders        Ordered    cephALEXin (KEFLEX) 500 MG capsule  4 times daily     04/12/17 0715    phenazopyridine (PYRIDIUM) 200 MG tablet  3 times daily     38/75/64 [id number]       Delora Fuel, MD 51/88/41 [phone]

## 2017-04-12 NOTE — ED Notes (Signed)
Patient states he had his foley removed 2 weeks ago c/o difficulty breathing and groin pain. States he thinks his urine is infected. . Patient normally walks with a walker and cane at home. Home O2@2  L/[place]

## 2017-04-12 NOTE — ED Notes (Signed)
Patient reports unable to get RX filled.  Case management notified.

## 2017-04-12 NOTE — ED Notes (Signed)
Patient wanted to keep his clothes on he is cold. Warm blankets given.

## 2017-04-12 NOTE — ED Notes (Signed)
Pt remains in waiting room. Updated on wait for treatment room. 

## 2017-04-12 NOTE — ED Triage Notes (Signed)
ptar had a cbg of 121  The pt is c/o chills intermittently

## 2017-04-15 ENCOUNTER — Ambulatory Visit: Payer: Self-pay | Admitting: Hematology & Oncology

## 2017-04-15 ENCOUNTER — Encounter (HOSPITAL_COMMUNITY): Payer: Self-pay

## 2017-04-15 LAB — URINE CULTURE

## 2017-04-16 ENCOUNTER — Telehealth: Payer: Self-pay | Admitting: *Deleted

## 2017-04-16 NOTE — Telephone Encounter (Signed)
Post ED Visit - Positive Culture Follow-up  Culture report reviewed by antimicrobial stewardship pharmacist:  []  Elenor Quinones, Pharm.D. []  Heide Guile, Pharm.D., BCPS AQ-ID []  Parks Neptune, Pharm.D., BCPS []  Alycia Rossetti, Pharm.D., BCPS []  [place], [place].D., BCPS, AAHIVP []  Legrand Como, Pharm.D., BCPS, AAHIVP []  Salome Arnt, PharmD, BCPS []  Dimitri Ped, PharmD, BCPS []  Vincenza Hews, PharmD, BCPS Ulanda Edison, PharmD  Positive urine culture Treated with Cephalexin, organism sensitive to the same and no further patient follow-up is required at this time.  Harlon Flor [facility] 04/16/2017, 11:46 AM

## 2017-04-22 ENCOUNTER — Telehealth: Payer: Self-pay | Admitting: Internal Medicine

## 2017-04-22 NOTE — Telephone Encounter (Signed)
Copied from [place]. Topic: Quick Communication - See Telephone Encounter >> Apr 22, 2017 11:01 AM Synthia Innocent wrote: CRM for notification. See Telephone encounter for:  Requesting refills on cephALEXin (KEFLEX) 500 MG capsule, [facility] 04/22/17.

## 2017-04-22 NOTE — Telephone Encounter (Signed)
Urine culture showed E. coli, sensitive to cephalosporins.  He got Keflex times 10 days, in males a 2-week treatment is appropriate. Okay to send cephalexin for 4 additional days, 16 tablets.

## 2017-04-22 NOTE — Telephone Encounter (Signed)
Please advise 

## 2017-04-22 NOTE — Telephone Encounter (Signed)
Pt. Called and states he was in the ED for UTI and he believes they want him to continue on the Keflex for a longer period time.He states he "feels better but still has a little burning near the end of my stream." Please advise.

## 2017-04-23 MED ORDER — CEPHALEXIN 500 MG PO CAPS: 500.0000 mg | ORAL_CAPSULE | Freq: Four times a day (QID) | ORAL | 0 refills | Status: DC

## 2017-04-23 NOTE — Telephone Encounter (Signed)
Rx sent to [facility].

## 2017-04-29 ENCOUNTER — Encounter (HOSPITAL_COMMUNITY): Payer: Self-pay

## 2017-04-29 ENCOUNTER — Other Ambulatory Visit: Payer: Self-pay

## 2017-04-29 ENCOUNTER — Inpatient Hospital Stay (HOSPITAL_COMMUNITY)
Admission: EM | Admit: 2017-04-29 | Discharge: 2017-05-08 | DRG: 690 | Disposition: A | Discharge status: Skilled Nursing Facility | Payer: Medicare Other | Attending: Internal Medicine | Admitting: Internal Medicine

## 2017-04-29 DIAGNOSIS — C946 Myelodysplastic disease, not classified: Secondary | ICD-10-CM | POA: Diagnosis not present

## 2017-04-29 DIAGNOSIS — I130 Hypertensive heart and chronic kidney disease with heart failure and stage 1 through stage 4 chronic kidney disease, or unspecified chronic kidney disease: Secondary | ICD-10-CM | POA: Diagnosis present

## 2017-04-29 DIAGNOSIS — Z7951 Long term (current) use of inhaled steroids: Secondary | ICD-10-CM

## 2017-04-29 DIAGNOSIS — E1122 Type 2 diabetes mellitus with diabetic chronic kidney disease: Secondary | ICD-10-CM | POA: Diagnosis present

## 2017-04-29 DIAGNOSIS — K5730 Diverticulosis of large intestine without perforation or abscess without bleeding: Secondary | ICD-10-CM | POA: Diagnosis not present

## 2017-04-29 DIAGNOSIS — B965 Pseudomonas (aeruginosa) (mallei) (pseudomallei) as the cause of diseases classified elsewhere: Secondary | ICD-10-CM | POA: Diagnosis present

## 2017-04-29 DIAGNOSIS — N183 Chronic kidney disease, stage 3 unspecified: Secondary | ICD-10-CM | POA: Diagnosis present

## 2017-04-29 DIAGNOSIS — I5032 Chronic diastolic (congestive) heart failure: Secondary | ICD-10-CM | POA: Diagnosis not present

## 2017-04-29 DIAGNOSIS — Z79899 Other long term (current) drug therapy: Secondary | ICD-10-CM

## 2017-04-29 DIAGNOSIS — T380X5A Adverse effect of glucocorticoids and synthetic analogues, initial encounter: Secondary | ICD-10-CM | POA: Diagnosis present

## 2017-04-29 DIAGNOSIS — T82838A Hemorrhage of vascular prosthetic devices, implants and grafts, initial encounter: Secondary | ICD-10-CM | POA: Diagnosis not present

## 2017-04-29 DIAGNOSIS — N499 Inflammatory disorder of unspecified male genital organ: Secondary | ICD-10-CM | POA: Diagnosis not present

## 2017-04-29 DIAGNOSIS — N12 Tubulo-interstitial nephritis, not specified as acute or chronic: Secondary | ICD-10-CM | POA: Diagnosis not present

## 2017-04-29 DIAGNOSIS — Z87440 Personal history of urinary (tract) infections: Secondary | ICD-10-CM

## 2017-04-29 DIAGNOSIS — I272 Pulmonary hypertension, unspecified: Secondary | ICD-10-CM | POA: Diagnosis present

## 2017-04-29 DIAGNOSIS — Z818 Family history of other mental and behavioral disorders: Secondary | ICD-10-CM

## 2017-04-29 DIAGNOSIS — M25579 Pain in unspecified ankle and joints of unspecified foot: Secondary | ICD-10-CM

## 2017-04-29 DIAGNOSIS — Y832 Surgical operation with anastomosis, bypass or graft as the cause of abnormal reaction of the patient, or of later complication, without mention of misadventure at the time of the procedure: Secondary | ICD-10-CM | POA: Diagnosis not present

## 2017-04-29 DIAGNOSIS — E1140 Type 2 diabetes mellitus with diabetic neuropathy, unspecified: Secondary | ICD-10-CM | POA: Diagnosis present

## 2017-04-29 DIAGNOSIS — Z9981 Dependence on supplemental oxygen: Secondary | ICD-10-CM

## 2017-04-29 DIAGNOSIS — Z7982 Long term (current) use of aspirin: Secondary | ICD-10-CM

## 2017-04-29 DIAGNOSIS — M10041 Idiopathic gout, right hand: Secondary | ICD-10-CM | POA: Diagnosis present

## 2017-04-29 DIAGNOSIS — Z87891 Personal history of nicotine dependence: Secondary | ICD-10-CM

## 2017-04-29 DIAGNOSIS — I2510 Atherosclerotic heart disease of native coronary artery without angina pectoris: Secondary | ICD-10-CM | POA: Diagnosis present

## 2017-04-29 DIAGNOSIS — M25473 Effusion, unspecified ankle: Secondary | ICD-10-CM

## 2017-04-29 DIAGNOSIS — N179 Acute kidney failure, unspecified: Secondary | ICD-10-CM | POA: Diagnosis present

## 2017-04-29 DIAGNOSIS — G4733 Obstructive sleep apnea (adult) (pediatric): Secondary | ICD-10-CM | POA: Diagnosis present

## 2017-04-29 DIAGNOSIS — N189 Chronic kidney disease, unspecified: Secondary | ICD-10-CM

## 2017-04-29 DIAGNOSIS — R5381 Other malaise: Secondary | ICD-10-CM | POA: Diagnosis present

## 2017-04-29 DIAGNOSIS — R52 Pain, unspecified: Secondary | ICD-10-CM | POA: Diagnosis not present

## 2017-04-29 DIAGNOSIS — D638 Anemia in other chronic diseases classified elsewhere: Secondary | ICD-10-CM | POA: Diagnosis present

## 2017-04-29 HISTORY — DX: Tubulo-interstitial nephritis, not specified as acute or chronic: N12

## 2017-04-29 LAB — COMPREHENSIVE METABOLIC PANEL
ALK PHOS: 120 U/L (ref 38–126)
ALT: 15 U/L — AB (ref 17–63)
ANION GAP: 6 (ref 5–15)
AST: 26 U/L (ref 15–41)
Albumin: 2.6 g/dL — ABNORMAL LOW (ref 3.5–5.0)
BILIRUBIN TOTAL: 0.4 mg/dL (ref 0.3–1.2)
BUN: 34 mg/dL — AB (ref 6–20)
CO2: 25 mmol/L (ref 22–32)
CREATININE: 1.24 mg/dL (ref 0.61–1.24)
Calcium: 9.6 mg/dL (ref 8.9–10.3)
Chloride: 108 mmol/L (ref 101–111)
GFR, EST NON AFRICAN AMERICAN: 55 mL/min — AB (ref >60)
Glucose, Bld: 102 mg/dL — ABNORMAL HIGH (ref 65–99)
Potassium: 4 mmol/L (ref 3.5–5.1)
Sodium: 139 mmol/L (ref 135–145)
Total Protein: 5.6 g/dL — ABNORMAL LOW (ref 6.5–8.1)

## 2017-04-29 LAB — URINALYSIS, ROUTINE W REFLEX MICROSCOPIC
Bilirubin Urine: NEGATIVE
Glucose, UA: NEGATIVE mg/dL
KETONES UR: NEGATIVE mg/dL
Nitrite: POSITIVE — AB
PH: 6 (ref 5.0–8.0)
Specific Gravity, Urine: 1.012 (ref 1.005–1.030)

## 2017-04-29 LAB — CBC
HCT: 33.3 % — ABNORMAL LOW (ref 39.0–52.0)
Hemoglobin: 10.1 g/dL — ABNORMAL LOW (ref 13.0–17.0)
MCH: 25.3 pg — AB (ref 26.0–34.0)
MCHC: 30.3 g/dL (ref 30.0–36.0)
MCV: 83.5 fL (ref 78.0–100.0)
PLATELETS: 330 10*3/uL (ref 150–400)
RBC: 3.99 MIL/uL — AB (ref 4.22–5.81)
RDW: 18.3 % — ABNORMAL HIGH (ref 11.5–15.5)
WBC: 13.4 10*3/uL — ABNORMAL HIGH (ref 4.0–10.5)

## 2017-04-29 LAB — LIPASE, BLOOD: Lipase: 24 U/L (ref 11–51)

## 2017-04-29 LAB — I-STAT CG4 LACTIC ACID, ED: LACTIC ACID, VENOUS: 0.93 mmol/L (ref 0.5–1.9)

## 2017-04-29 MED ORDER — DEXTROSE 5 % IV SOLN
1.0000 g | Freq: Once | INTRAVENOUS | Status: AC
  Administered 2017-04-30: 1 g via INTRAVENOUS
  Filled 2017-04-29: qty 10

## 2017-04-29 MED ORDER — SODIUM CHLORIDE 0.9 % IV BOLUS (SEPSIS)
500.0000 mL | Freq: Once | INTRAVENOUS | Status: AC
  Administered 2017-04-29: 500 mL via INTRAVENOUS

## 2017-04-29 MED ORDER — IOPAMIDOL (ISOVUE-300) INJECTION 61%
INTRAVENOUS | Status: AC
  Administered 2017-04-30: 100 mL
  Filled 2017-04-29: qty 100

## 2017-04-29 NOTE — ED Provider Notes (Signed)
[facility] EMERGENCY DEPARTMENT Provider Note   CSN: [id number] Arrival date & time: 04/29/17  1851     History   Chief Complaint Chief Complaint  Patient presents with  . Abdominal Pain  . Urinary Tract Infection    HPI Juan [name] is a 76 y.o. male.  HPI   76 year old male with history of recurrent urinary tract infection, diabetes, hypertension, AAA, CHF, myeloproliferative disease presenting with urinary symptoms.  Patient report he is been having urinary discomfort ongoing for the past 6 months.  Complaining of lower abdominal pain which he describes a soreness and rates as 10 out of 10 with burning urination urinary frequency and urgency.  Symptom has become progressively worse.  He is eating less, feeling nauseous and did vomit a week ago.  He has been seen and evaluated for his condition several times in the past and was treated for UTI with antibiotic.  He has taken his antibiotic as prescribed but noticed no improvement.  He lives at home by himself, using a walker, and states that he is feeling very sick.  Report feeling lightheadedness, dizzy and weak.  He denies having fever, chest pain, shortness of breath, productive cough, hematuria.  Denies bowel complication.  Patient mentioned follow-up with PCP for this condition but was not given any specific treatment.  Past Medical History:  Diagnosis Date  . Allergic rhinitis   . Anemia   . Ascending aortic aneurysm ([place]) 03/2014   4.3cm on CT scan  . CAD (coronary artery disease)    dx elsewheer in past, no documentation. Non-ischemic myoview 2007  . Chronic diastolic CHF (congestive heart failure), NYHA class 2 (HCC)    Normal EF w/ grade 1 dd by echo 12/2015  . Edema    R>L leg, u/s 5-12 neg for DVT  . Hemorrhoid   . History of thrombocytosis   . Hypertension   . Iron deficiency anemia due to chronic blood loss 03/31/2017  . 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 Migraine    "once/wk at least"  (07/11/2013)  . Myeloproliferative disease ([place])   . Shortness of breath   . Sinus congestion   . Sleep apnea, obstructive    at some point used CPAP, was d/c  years ago  . Type II diabetes mellitus [facility])     Patient Active Problem List   Diagnosis Date Noted  . Iron deficiency anemia due to chronic blood loss 03/31/2017  . 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 Diffuse pain 01/26/2017  . Infection due to urethral catheter ([place])   . Urinary retention 01/24/2017  . Right hip pain 12/24/2016  . 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 Muscle spasm 11/16/2016  . Neck pain 11/14/2016  . Elevated troponin 11/14/2016  . Arthritis   . Recurrent pneumonia 10/31/2016  . Acute extremity pain 10/31/2016  . 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 Pulmonary hypertension severe   . Sprain of left rotator cuff capsule   . Hyperkalemia   . Acute kidney injury superimposed on CKD ([place])   . Fever 10/08/2016  . Left arm pain 10/08/2016  . Paroxysmal SVT (supraventricular tachycardia) ([place]) 04/20/2016  . Type II diabetes mellitus ([place]) 04/20/2016  . Gout 04/20/2016  . Hyperuricemia 04/20/2016  . B12 deficiency 04/20/2016  . 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 Hydronephrosis   . Thrombocytosis ([place]) 04/07/2016  . Anemia 04/07/2016  . Pressure injury of skin 04/01/2016  . Pressure ulcer stage II 12/31/2015  . Sepsis (  [place]) 12/29/2015  . Acute lower UTI 12/29/2015  . T wave inversion in EKG 12/29/2015  . PCP NOTES >>>>>>>>>>>>>>>>> 05/02/2015  . Arm pain   . Peripheral edema   . Unable to ambulate   . Thoracic aortic aneurysm ([place]) 04/26/2014  . CKD (chronic kidney disease) stage 3, GFR 30-59 ml/min (HCC) 04/26/2014  . 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 Chest pain 04/15/2014  . Encounter for palliative care 04/06/2014  . Generalized weakness 04/06/2014  . Venous stasis dermatitis of both lower extremities   . Malnutrition of moderate degree  ([place]) 03/31/2014  . Morbid obesity due to excess calories ([place]) 03/29/2014  . 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 Poor compliance with advise  05/05/2012  . Annual physical exam 01/14/2012  . Weight loss 01/14/2012  . *Chronic lower extremity edema, stasis dermatitis 06/09/2011  . Symptomatic anemia 09/07/2006  . Myeloproliferative disease ([place]) 09/07/2006  . Obstructive sleep apnea 09/07/2006  . Essential hypertension 09/07/2006  . Coronary atherosclerosis 09/07/2006  . Chronic diastolic CHF (congestive heart failure) ([place]) 09/07/2006  . Allergic rhinitis 09/07/2006    Past Surgical History:  Procedure Laterality Date  . TOE SURGERY Right    "tried to straighten out big toe" (07/11/2013)       Home Medications    Prior to Admission medications   Medication Sig Start Date End Date Taking? Authorizing Provider  acetaminophen (TYLENOL) 325 MG tablet Take 2 tablets (650 mg total) by mouth 4 (four) times daily. 01/26/17   Debbe Odea, MD  aspirin 325 MG tablet Take 325 mg by mouth daily.    [provider]  b complex vitamins tablet Take 1 tablet by mouth daily.    [provider]  cephALEXin (KEFLEX) 500 MG capsule Take 1 capsule (500 mg total) by mouth 4 (four) times daily. 04/23/17   Colon Branch, MD  diclofenac sodium (VOLTAREN) 1 % GEL Apply 4 g topically 4 (four) times daily. Apply to bilateral  knees 01/26/17   Debbe Odea, MD  feeding supplement, GLUCERNA SHAKE, (GLUCERNA SHAKE) LIQD Take 118.5 mLs by mouth daily. Reported on 08/21/2015    [provider]  ferrous gluconate (FERGON) 240 (27 FE) MG tablet Take 240 mg by mouth daily.    [provider]  fluticasone (FLONASE) 50 MCG/ACT nasal spray Place 2 sprays into both nostrils daily. Reported on 05/02/2015    [provider]  furosemide (LASIX) 40 MG tablet Take 40 mg by mouth daily.    [provider]  gabapentin (NEURONTIN) 100 MG capsule Take 2 capsules  (200 mg total) by mouth 3 (three) times daily as needed (pain). Reported on 08/22/2015 10/17/16   Rama, Venetia Maxon, MD  hydrALAZINE (APRESOLINE) 25 MG tablet Take 1 tablet (25 mg total) by mouth 3 (three) times daily. 02/09/17 05/10/17  Arbutus Leas, NP  hydrocerin (EUCERIN) CREA Apply Eucerin cream to BLE Q day after bathing and roughly towel drying to remove loose skin Patient taking differently: Apply 1 application topically daily as needed (dryness).  01/27/14   Ghimire, Henreitta Leber, MD  hydrocortisone 2.5 % cream Apply topically 2 (two) times daily. 03/17/17 03/17/18  Colon Branch, MD  hydrOXYzine (ATARAX/VISTARIL) 10 MG tablet Take 1 tablet (10 mg total) by mouth every 4 (four) hours as needed for itching. 01/11/17   Gareth Morgan, MD  OXYGEN Inhale 2 L into the lungs continuous.    [provider]  phenazopyridine (PYRIDIUM) 200 MG tablet Take 1  tablet (200 mg total) by mouth 3 (three) times daily. 30/16/01   Delora Fuel, MD  polyethylene glycol [facility] / Floria Raveling) packet Take 17 g by mouth daily as needed for mild constipation. 01/26/17   Debbe Odea, MD  potassium chloride SA (K-DUR,KLOR-CON) 20 MEQ tablet Take 20 mEq by mouth as needed (when congested).    [provider]    Family History Family History  Problem Relation Age of Onset  . Schizophrenia Son   . Colon cancer Neg Hx   . Prostate cancer Neg Hx   . Heart attack Neg Hx   . Diabetes Neg Hx     Social History Social History   Tobacco Use  . Smoking status: Former Smoker    Packs/day: 0.25    Years: 12.00    Pack years: 3.00    Types: Cigarettes    Last attempt to quit: 05/18/1966    Years since quitting: 50.9  . Smokeless tobacco: Never Used  . Tobacco comment: quit smoking 45 years ago  Substance Use Topics  . Alcohol use: No    Alcohol/week: 0.0 oz  . Drug use: No     Allergies   Patient has no known allergies.   Review of Systems Review of Systems  All other systems reviewed and are  negative.    Physical Exam Updated Vital Signs BP (!) 158/75 (BP Location: Right Arm)   Pulse 73   Temp 97.8 F (36.6 C) (Oral)   Resp 15   Ht 6\' 3"  (1.905 m)   Wt 83.5 kg (184 lb)   SpO2 100%   BMI 23.00 kg/m   Physical Exam  Constitutional: He appears well-developed and well-nourished. No distress.  Elderly male, ill-appearing  HENT:  Head: Atraumatic.  Eyes: Conjunctivae are normal.  Neck: Neck supple.  Cardiovascular:  Tachycardia without murmur rubs or gallops  Pulmonary/Chest: Effort normal and breath sounds normal.  Abdominal: Soft. Normal appearance. There is tenderness (Mild suprapubic tenderness without guarding or rebound tenderness).  Neurological: He is alert.  Skin: No rash noted.  Psychiatric: He has a normal mood and affect.  Nursing note and vitals reviewed.    ED Treatments / Results  Labs (all labs ordered are listed, but only abnormal results are displayed) Labs Reviewed  COMPREHENSIVE METABOLIC PANEL - Abnormal; Notable for the following components:      Result Value   Glucose, Bld 102 (*)    BUN 34 (*)    Total Protein 5.6 (*)    Albumin 2.6 (*)    ALT 15 (*)    GFR calc non Af Amer 55 (*)    All other components within normal limits  CBC - Abnormal; Notable for the following components:   WBC 13.4 (*)    RBC 3.99 (*)    Hemoglobin 10.1 (*)    HCT 33.3 (*)    MCH 25.3 (*)    RDW 18.3 (*)    All other components within normal limits  URINALYSIS, ROUTINE W REFLEX MICROSCOPIC - Abnormal; Notable for the following components:   APPearance CLOUDY (*)    Hgb urine dipstick SMALL (*)    Protein, ur >=300 (*)    Nitrite POSITIVE (*)    Leukocytes, UA LARGE (*)    Bacteria, UA MANY (*)    Squamous Epithelial / LPF 0-5 (*)    All other components within normal limits  CULTURE, BLOOD (ROUTINE X 2)  CULTURE, BLOOD (ROUTINE X 2)  URINE CULTURE  LIPASE, BLOOD  I-STAT CG4 LACTIC ACID, ED    EKG  EKG Interpretation None        Radiology Ct Abdomen Pelvis W Contrast  Result Date: 04/30/2017 CLINICAL DATA:  Chronic severe groin pain, with infection.  Dysuria. EXAM: CT ABDOMEN AND PELVIS WITH CONTRAST TECHNIQUE: Multidetector CT imaging of the abdomen and pelvis was performed using the standard protocol following bolus administration of intravenous contrast. CONTRAST:  116mL ISOVUE-300 IOPAMIDOL (ISOVUE-300) INJECTION 61% COMPARISON:  CT of the abdomen and pelvis performed 12/23/2016, and renal ultrasound performed 02/08/2017 FINDINGS: Lower chest: Right basilar airspace opacity likely reflects atelectasis. Scattered coronary artery calcifications are seen. Hepatobiliary: The liver is unremarkable in appearance. The gallbladder is unremarkable in appearance. The common bile duct remains normal in caliber. Pancreas:  The pancreas is within normal limits. Spleen: An apparent peripheral infarct is noted within the spleen. The spleen is enlarged, measuring 17.9 cm in length. Adrenals/Urinary Tract: The adrenal glands are unremarkable in appearance. Nonspecific perinephric stranding is noted bilaterally. A right renal cyst is noted. There is mild wall enhancement along the right renal pelvis and proximal ureter, raising concern for right-sided pyelonephritis and ureteritis. No significant hydronephrosis is seen. No renal or ureteral stones are identified. Stomach/Bowel: The stomach is unremarkable in appearance. The small bowel is within normal limits. The appendix is normal in caliber, without evidence of appendicitis. Scattered diverticulosis is noted along the descending and sigmoid colon, without evidence of diverticulitis. Vascular/Lymphatic: Scattered calcification is seen along the abdominal aorta and its branches. The abdominal aorta is otherwise grossly unremarkable. The inferior vena cava is grossly unremarkable. No retroperitoneal lymphadenopathy is seen. No pelvic sidewall lymphadenopathy is identified. Reproductive: There  is diffuse irregular wall thickening about the bladder, with multiple bladder diverticula noted. The appearance raises concern for cystitis or chronic inflammation. The prostate remains normal in size. Other: A nonspecific 1.7 cm subcutaneous soft tissue nodule is noted at the left inguinal region. Underlying nodes are normal in size. Musculoskeletal: Diffuse heterogeneous sclerosis of the visualized osseous structures reflects the patient's myeloproliferative disorder. The visualized musculature is unremarkable in appearance. IMPRESSION: 1. Mild wall enhancement along the right renal pelvis and proximal right ureter, raising concern for right-sided pyelonephritis and ureteritis. No significant hydronephrosis seen. 2. Diffuse irregular wall thickening about the bladder, with multiple bladder diverticula. The appearance raises concern for cystitis or chronic inflammation. 3. 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 4. Nonspecific 1.7 cm subcutaneous soft tissue nodule at the left inguinal region. Underlying lymph nodes are normal in size. 5. Right basilar airspace opacity likely reflects atelectasis. 6. Scattered coronary artery calcifications noted. 7. Scattered diverticulosis along the descending and sigmoid colon, without evidence of diverticulitis. 8. Diffuse heterogeneous sclerosis of the visualized osseous structures reflects the patient's myeloproliferative disorder. Aortic Atherosclerosis (ICD10-I70.0). Electronically Signed   By: Garald Balding M.D.   On: 04/30/2017 02:08    Procedures Procedures (including critical care time)  Medications Ordered in ED Medications  cefTRIAXone (ROCEPHIN) 1 g in dextrose 5 % 50 mL IVPB (not administered)  acetaminophen (TYLENOL) tablet 650 mg (not administered)    Or  acetaminophen (TYLENOL) suppository 650 mg (not administered)  ondansetron (ZOFRAN) tablet 4 mg (not administered)    Or  ondansetron (ZOFRAN) injection 4 mg (not  administered)  enoxaparin (LOVENOX) injection 40 mg (not administered)  cefTRIAXone (ROCEPHIN) 1 g in dextrose 5 % 50 mL IVPB (1 g Intravenous New Bag/Given 04/30/17 0006)  sodium chloride 0.9 % bolus 500 mL (500  mLs Intravenous New Bag/Given 04/29/17 2326)  iopamidol (ISOVUE-300) 61 % injection (100 mLs  Contrast Given 04/30/17 0027)     Initial Impression / Assessment and Plan / ED Course  I have reviewed the triage vital signs and the nursing notes.  Pertinent labs & imaging results that were available during my care of the patient were reviewed by me and considered in my medical decision making (see chart for details).     BP (!) 161/77   Pulse 85   Temp 97.8 F (36.6 C) (Oral)   Resp (!) 21   Ht 6\' 3"  (1.905 m)   Wt 83.5 kg (184 lb)   SpO2 93%   BMI 23.00 kg/m    Final Clinical Impressions(s) / ED Diagnoses   Final diagnoses:  Pyelonephritis    ED Discharge Orders    None     10:25 PM Patient presents with symptoms consistent with a urinary tract infection.  His last ER visit was on 11/26 for the same symptoms.  He was diagnosed with a UTI and was treated with Keflex.  He reports that his symptom has not improved.  His last urine culture shows E. coli sensitive for Keflex.  He has been taking his Keflex as prescribed.  Currently has an elevated white count of 13.4, near his baseline.  Urine shows nitrite positive consistent with a urinary tract infection.  Since patient appears to failed outpatient treatment, he may benefit from hospital admission for further management.  Given his age and his abdominal discomfort, will obtain an abdominal pelvic CT scan for further evaluation. Care discussed with DR. Schlossman.    3:07 AM Abd/pelvis CT with finding suggestive of pyelonephritis.  No evidence of infected stone.  Appreciate consultation from [facility] Dr. Alcario Drought who agrees to see pt in the ER and will admit for further management.    Domenic Moras,  PA-C 04/30/17 Anola Gurney    Gareth Morgan, MD 05/02/17 [phone]

## 2017-04-29 NOTE — ED Triage Notes (Addendum)
Pt arrives here with c/o groin infection that has been being treated over last 6 months. Pt states to EMS that he is on medication and was released from [facility] 2-6 weeks ago. Pt would not allow EMS to do exam, see his discharge papers or medications.Arrives on 2l/. Pt c/o severe pain with voiding and request admission to "clear up this infection"

## 2017-04-29 NOTE — ED Notes (Signed)
Pt states "every time I pee the bottom of my feet hurt. I need you to put me in the hospital to get all this infection out of me". He endorses abdominal pain, weakness, dizziness. He states he has been on antibiotics x 6 months

## 2017-04-30 ENCOUNTER — Emergency Department (HOSPITAL_COMMUNITY): Payer: Medicare Other

## 2017-04-30 DIAGNOSIS — I5032 Chronic diastolic (congestive) heart failure: Secondary | ICD-10-CM | POA: Diagnosis not present

## 2017-04-30 DIAGNOSIS — M79661 Pain in right lower leg: Secondary | ICD-10-CM | POA: Diagnosis not present

## 2017-04-30 DIAGNOSIS — G4733 Obstructive sleep apnea (adult) (pediatric): Secondary | ICD-10-CM | POA: Diagnosis present

## 2017-04-30 DIAGNOSIS — N189 Chronic kidney disease, unspecified: Secondary | ICD-10-CM | POA: Diagnosis not present

## 2017-04-30 DIAGNOSIS — N390 Urinary tract infection, site not specified: Secondary | ICD-10-CM | POA: Diagnosis not present

## 2017-04-30 DIAGNOSIS — C946 Myelodysplastic disease, not classified: Secondary | ICD-10-CM | POA: Diagnosis present

## 2017-04-30 DIAGNOSIS — Z818 Family history of other mental and behavioral disorders: Secondary | ICD-10-CM | POA: Diagnosis not present

## 2017-04-30 DIAGNOSIS — N179 Acute kidney failure, unspecified: Secondary | ICD-10-CM | POA: Diagnosis not present

## 2017-04-30 DIAGNOSIS — N12 Tubulo-interstitial nephritis, not specified as acute or chronic: Secondary | ICD-10-CM | POA: Diagnosis not present

## 2017-04-30 DIAGNOSIS — M10041 Idiopathic gout, right hand: Secondary | ICD-10-CM | POA: Diagnosis present

## 2017-04-30 DIAGNOSIS — I2510 Atherosclerotic heart disease of native coronary artery without angina pectoris: Secondary | ICD-10-CM | POA: Diagnosis present

## 2017-04-30 DIAGNOSIS — E119 Type 2 diabetes mellitus without complications: Secondary | ICD-10-CM | POA: Diagnosis not present

## 2017-04-30 DIAGNOSIS — R2689 Other abnormalities of gait and mobility: Secondary | ICD-10-CM | POA: Diagnosis not present

## 2017-04-30 DIAGNOSIS — R41841 Cognitive communication deficit: Secondary | ICD-10-CM | POA: Diagnosis not present

## 2017-04-30 DIAGNOSIS — M6281 Muscle weakness (generalized): Secondary | ICD-10-CM | POA: Diagnosis not present

## 2017-04-30 DIAGNOSIS — T82898A Other specified complication of vascular prosthetic devices, implants and grafts, initial encounter: Secondary | ICD-10-CM | POA: Diagnosis not present

## 2017-04-30 DIAGNOSIS — T380X5A Adverse effect of glucocorticoids and synthetic analogues, initial encounter: Secondary | ICD-10-CM | POA: Diagnosis present

## 2017-04-30 DIAGNOSIS — D649 Anemia, unspecified: Secondary | ICD-10-CM | POA: Diagnosis not present

## 2017-04-30 DIAGNOSIS — Z7982 Long term (current) use of aspirin: Secondary | ICD-10-CM | POA: Diagnosis not present

## 2017-04-30 DIAGNOSIS — Z87440 Personal history of urinary (tract) infections: Secondary | ICD-10-CM | POA: Diagnosis not present

## 2017-04-30 DIAGNOSIS — I129 Hypertensive chronic kidney disease with stage 1 through stage 4 chronic kidney disease, or unspecified chronic kidney disease: Secondary | ICD-10-CM | POA: Diagnosis not present

## 2017-04-30 DIAGNOSIS — E1140 Type 2 diabetes mellitus with diabetic neuropathy, unspecified: Secondary | ICD-10-CM | POA: Diagnosis present

## 2017-04-30 DIAGNOSIS — R278 Other lack of coordination: Secondary | ICD-10-CM | POA: Diagnosis not present

## 2017-04-30 DIAGNOSIS — M25571 Pain in right ankle and joints of right foot: Secondary | ICD-10-CM | POA: Diagnosis not present

## 2017-04-30 DIAGNOSIS — N19 Unspecified kidney failure: Secondary | ICD-10-CM | POA: Diagnosis not present

## 2017-04-30 DIAGNOSIS — Z87891 Personal history of nicotine dependence: Secondary | ICD-10-CM | POA: Diagnosis not present

## 2017-04-30 DIAGNOSIS — N183 Chronic kidney disease, stage 3 (moderate): Secondary | ICD-10-CM

## 2017-04-30 DIAGNOSIS — K5730 Diverticulosis of large intestine without perforation or abscess without bleeding: Secondary | ICD-10-CM | POA: Diagnosis not present

## 2017-04-30 DIAGNOSIS — B965 Pseudomonas (aeruginosa) (mallei) (pseudomallei) as the cause of diseases classified elsewhere: Secondary | ICD-10-CM | POA: Diagnosis present

## 2017-04-30 DIAGNOSIS — Z452 Encounter for adjustment and management of vascular access device: Secondary | ICD-10-CM | POA: Diagnosis not present

## 2017-04-30 DIAGNOSIS — T82838A Hemorrhage of vascular prosthetic devices, implants and grafts, initial encounter: Secondary | ICD-10-CM | POA: Diagnosis not present

## 2017-04-30 DIAGNOSIS — I272 Pulmonary hypertension, unspecified: Secondary | ICD-10-CM | POA: Diagnosis not present

## 2017-04-30 DIAGNOSIS — M7989 Other specified soft tissue disorders: Secondary | ICD-10-CM | POA: Diagnosis not present

## 2017-04-30 DIAGNOSIS — I10 Essential (primary) hypertension: Secondary | ICD-10-CM | POA: Diagnosis not present

## 2017-04-30 DIAGNOSIS — Z9981 Dependence on supplemental oxygen: Secondary | ICD-10-CM | POA: Diagnosis not present

## 2017-04-30 DIAGNOSIS — Y832 Surgical operation with anastomosis, bypass or graft as the cause of abnormal reaction of the patient, or of later complication, without mention of misadventure at the time of the procedure: Secondary | ICD-10-CM | POA: Diagnosis not present

## 2017-04-30 DIAGNOSIS — R5381 Other malaise: Secondary | ICD-10-CM | POA: Diagnosis present

## 2017-04-30 DIAGNOSIS — J80 Acute respiratory distress syndrome: Secondary | ICD-10-CM | POA: Diagnosis not present

## 2017-04-30 DIAGNOSIS — I130 Hypertensive heart and chronic kidney disease with heart failure and stage 1 through stage 4 chronic kidney disease, or unspecified chronic kidney disease: Secondary | ICD-10-CM | POA: Diagnosis present

## 2017-04-30 DIAGNOSIS — Z7951 Long term (current) use of inhaled steroids: Secondary | ICD-10-CM | POA: Diagnosis not present

## 2017-04-30 DIAGNOSIS — E1122 Type 2 diabetes mellitus with diabetic chronic kidney disease: Secondary | ICD-10-CM | POA: Diagnosis present

## 2017-04-30 DIAGNOSIS — M25471 Effusion, right ankle: Secondary | ICD-10-CM | POA: Diagnosis not present

## 2017-04-30 DIAGNOSIS — Z79899 Other long term (current) drug therapy: Secondary | ICD-10-CM | POA: Diagnosis not present

## 2017-04-30 DIAGNOSIS — D638 Anemia in other chronic diseases classified elsewhere: Secondary | ICD-10-CM | POA: Diagnosis present

## 2017-04-30 LAB — MRSA PCR SCREENING: MRSA BY PCR: NEGATIVE

## 2017-04-30 MED ORDER — FUROSEMIDE 20 MG PO TABS: 40.0000 mg | ORAL_TABLET | Freq: Every day | ORAL | Status: DC

## 2017-04-30 MED ORDER — ACETAMINOPHEN 325 MG PO TABS
650.0000 mg | ORAL_TABLET | Freq: Four times a day (QID) | ORAL | Status: DC | PRN
  Administered 2017-04-30 – 2017-05-08 (×11): 650 mg via ORAL
  Filled 2017-04-30 (×11): qty 2

## 2017-04-30 MED ORDER — GABAPENTIN 100 MG PO CAPS
200.0000 mg | ORAL_CAPSULE | Freq: Three times a day (TID) | ORAL | Status: DC | PRN
  Administered 2017-04-30 – 2017-05-03 (×3): 200 mg via ORAL
  Filled 2017-04-30 (×4): qty 2

## 2017-04-30 MED ORDER — ONDANSETRON HCL 4 MG/2ML IJ SOLN
4.0000 mg | Freq: Four times a day (QID) | INTRAMUSCULAR | Status: DC | PRN
Start: 2017-04-30 — End: 2017-05-08
  Administered 2017-05-01: 4 mg via INTRAVENOUS
  Filled 2017-04-30 (×2): qty 2

## 2017-04-30 MED ORDER — ONDANSETRON HCL 4 MG PO TABS
4.0000 mg | ORAL_TABLET | Freq: Four times a day (QID) | ORAL | Status: DC | PRN
  Administered 2017-04-30: 4 mg via ORAL
  Filled 2017-04-30: qty 1

## 2017-04-30 MED ORDER — PHENAZOPYRIDINE HCL 200 MG PO TABS
200.0000 mg | ORAL_TABLET | Freq: Three times a day (TID) | ORAL | Status: DC
  Administered 2017-04-30 – 2017-05-03 (×10): 200 mg via ORAL
  Filled 2017-04-30 (×10): qty 1

## 2017-04-30 MED ORDER — HYDRALAZINE HCL 25 MG PO TABS
25.0000 mg | ORAL_TABLET | Freq: Three times a day (TID) | ORAL | Status: DC
  Administered 2017-04-30 – 2017-05-08 (×25): 25 mg via ORAL
  Filled 2017-04-30 (×25): qty 1

## 2017-04-30 MED ORDER — HYDROCORTISONE 1 % EX CREA
TOPICAL_CREAM | Freq: Two times a day (BID) | CUTANEOUS | Status: DC
  Administered 2017-04-30 – 2017-05-08 (×16): via TOPICAL
  Filled 2017-04-30 (×2): qty 28

## 2017-04-30 MED ORDER — GLUCERNA SHAKE PO LIQD
118.5000 mL | Freq: Every day | ORAL | Status: DC
  Administered 2017-04-30 – 2017-05-08 (×9): 118.5 mL via ORAL

## 2017-04-30 MED ORDER — FLUTICASONE PROPIONATE 50 MCG/ACT NA SUSP
2.0000 | Freq: Every day | NASAL | Status: DC
  Administered 2017-04-30 – 2017-05-08 (×9): 2 via NASAL
  Filled 2017-04-30: qty 16

## 2017-04-30 MED ORDER — FERROUS GLUCONATE 324 (38 FE) MG PO TABS
324.0000 mg | ORAL_TABLET | Freq: Every day | ORAL | Status: DC
Start: 2017-04-30 — End: 2017-05-08
  Administered 2017-04-30 – 2017-05-08 (×9): 324 mg via ORAL
  Filled 2017-04-30 (×9): qty 1

## 2017-04-30 MED ORDER — DEXTROSE 5 % IV SOLN
1.0000 g | INTRAVENOUS | Status: DC
  Administered 2017-04-30 – 2017-05-01 (×2): 1 g via INTRAVENOUS
  Filled 2017-04-30 (×2): qty 10

## 2017-04-30 MED ORDER — POLYETHYLENE GLYCOL 3350 17 G PO PACK
17.0000 g | PACK | Freq: Every day | ORAL | Status: DC | PRN
  Administered 2017-05-04: 17 g via ORAL
  Filled 2017-04-30: qty 1

## 2017-04-30 MED ORDER — DEXTROSE 5 % IV SOLN: 1.0000 g | INTRAVENOUS | Status: DC

## 2017-04-30 MED ORDER — ACETAMINOPHEN 650 MG RE SUPP: 650.0000 mg | Freq: Four times a day (QID) | RECTAL | Status: DC | PRN

## 2017-04-30 MED ORDER — HYDROXYZINE HCL 10 MG PO TABS
10.0000 mg | ORAL_TABLET | ORAL | Status: DC | PRN
Start: 2017-04-30 — End: 2017-05-08
  Filled 2017-04-30: qty 1

## 2017-04-30 MED ORDER — DICLOFENAC SODIUM 1 % TD GEL
4.0000 g | Freq: Four times a day (QID) | TRANSDERMAL | Status: DC
  Administered 2017-04-30 – 2017-05-08 (×29): 4 g via TOPICAL
  Filled 2017-04-30: qty 100

## 2017-04-30 MED ORDER — HYDROCERIN EX CREA
1.0000 "application " | TOPICAL_CREAM | Freq: Every day | CUTANEOUS | Status: DC | PRN
  Administered 2017-05-06: 1 via TOPICAL
  Filled 2017-04-30: qty 113

## 2017-04-30 MED ORDER — ASPIRIN 325 MG PO TABS
325.0000 mg | ORAL_TABLET | Freq: Every day | ORAL | Status: DC
  Administered 2017-04-30 – 2017-05-08 (×9): 325 mg via ORAL
  Filled 2017-04-30 (×9): qty 1

## 2017-04-30 MED ORDER — ENOXAPARIN SODIUM 40 MG/0.4ML ~~LOC~~ SOLN
40.0000 mg | SUBCUTANEOUS | Status: DC
  Administered 2017-04-30 – 2017-05-06 (×7): 40 mg via SUBCUTANEOUS
  Filled 2017-04-30 (×7): qty 0.4

## 2017-04-30 NOTE — Plan of Care (Signed)
  Education: Knowledge of General Education information will improve 04/30/2017 0615 - Progressing by Anson Fret, RN Note POC and orders reviewed with pt.

## 2017-04-30 NOTE — Progress Notes (Signed)
Patient admitted earlier today by Dr. Jennette Kettle. See H&P for details.  76 year old male history of myeloproliferative disorder, CHF, pulmonary hypertension, CAD, status 3 who presented to the emergency department with worsening dysuria. Patient was recently diagnosed with Escherichia coli UTI 04/12/2017 and placed on Keflex. Symptoms did not entirely resolve. He continues complain of dysuria and lower abdominal pain. Patient currently on IV ceftriaxone. CT suggested a right-sided pyelonephritis.  Time spent: 15 minutes  Kenita Bines D.O. [facility] Pager [phone]  If 7PM-7AM, please contact night-coverage [email or web address] Password [email or web address] 04/30/2017, 3:21 PM

## 2017-04-30 NOTE — Progress Notes (Signed)
Chaplain spent time with [name] and offered Ministry of Presence, Scripture and Prayer. [name] expressed gratitude for the time spent and expressed that he would appreciate my returning to visit. I will return to visit on Sunday 14 December. Prayer is a centering force in [name] life.

## 2017-04-30 NOTE — Progress Notes (Signed)
Pt. transported via stretcher from ER to 6N-14; alert and oriented x4; somewhat demanding; incontinent of urine and cleaned up/pericare. Pt. oriented to room and call button; and informed of policy re: valuables.

## 2017-04-30 NOTE — H&P (Signed)
History and Physical    Kyrin Gratz IRS:[id number] DOB: 06-Oct-1940 DOA: 04/29/2017  PCP: Colon Branch, MD  Patient coming from: Home  I have personally briefly reviewed patient's old medical records in [place]  Chief Complaint: Dysuria  HPI: Juan [name] is a 76 y.o. male with medical history significant of chronic debility, myelo proliferative disorder, CHF, severe pulm HTN on 2L O2 at baseline, CKD stage 3.  Patient presents to the ED with c/o worsening dysuria.  Patient was diagnosed with E.Coli UTI on 11/26.  Treated with course of Keflex which it was sensitive to.  Initially symptoms improved some but not entirely, but after finishing course returned and got worse.  He reports dysuria, urinary frequency and urgency, lower abdominal pain.  He lives at home by himself, using a walker.   ED Course: UA again suggestive of UTI.  CT suggestive of R sided pyelo   Review of Systems: As per HPI otherwise 10 point review of systems negative.   Past Medical History:  Diagnosis Date  . Allergic rhinitis   . Anemia   . Ascending aortic aneurysm ([place]) 03/2014   4.3cm on CT scan  . CAD (coronary artery disease)    dx elsewheer in past, no documentation. Non-ischemic myoview 2007  . Chronic diastolic CHF (congestive heart failure), NYHA class 2 (HCC)    Normal EF w/ grade 1 dd by echo 12/2015  . Edema    R>L leg, u/s 5-12 neg for DVT  . Hemorrhoid   . History of thrombocytosis   . Hypertension   . Iron deficiency anemia due to chronic blood loss 03/31/2017  . 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 Migraine    "once/wk at least" (07/11/2013)  . Myeloproliferative disease ([place])   . Shortness of breath   . Sinus congestion   . Sleep apnea, obstructive    at some point used CPAP, was d/c  years ago  . Type II diabetes mellitus ([place])     Past Surgical History:  Procedure Laterality Date  . TOE SURGERY Right    "tried to straighten out big toe" (07/11/2013)     reports that he quit  smoking about 50 years ago. His smoking use included cigarettes. He has a 3.00 pack-year smoking history. he has never used smokeless tobacco. He reports that he does not drink alcohol or use drugs.  No Known Allergies  Family History  Problem Relation Age of Onset  . Schizophrenia Son   . Colon cancer Neg Hx   . Prostate cancer Neg Hx   . Heart attack Neg Hx   . Diabetes Neg Hx      Prior to Admission medications   Medication Sig Start Date End Date Taking? Authorizing Provider  acetaminophen (TYLENOL) 325 MG tablet Take 2 tablets (650 mg total) by mouth 4 (four) times daily. 01/26/17   Debbe Odea, MD  aspirin 325 MG tablet Take 325 mg by mouth daily.    [provider]  b complex vitamins tablet Take 1 tablet by mouth daily.    [provider]  diclofenac sodium (VOLTAREN) 1 % GEL Apply 4 g topically 4 (four) times daily. Apply to bilateral  knees 01/26/17   Debbe Odea, MD  feeding supplement, GLUCERNA SHAKE, (GLUCERNA SHAKE) LIQD Take 118.5 mLs by mouth daily. Reported on 08/21/2015    [provider]  ferrous gluconate (FERGON) 240 (27 FE) MG tablet Take 240 mg by mouth daily.    [provider]  fluticasone (FLONASE) 50 MCG/ACT nasal spray Place 2 sprays into both nostrils daily. Reported on 05/02/2015    [provider]  furosemide (LASIX) 40 MG tablet Take 40 mg by mouth daily.    [provider]  gabapentin (NEURONTIN) 100 MG capsule Take 2 capsules (200 mg total) by mouth 3 (three) times daily as needed (pain). Reported on 08/22/2015 10/17/16   Rama, Venetia Maxon, MD  hydrALAZINE (APRESOLINE) 25 MG tablet Take 1 tablet (25 mg total) by mouth 3 (three) times daily. 02/09/17 05/10/17  Arbutus Leas, NP  hydrocerin (EUCERIN) CREA Apply Eucerin cream to BLE Q day after bathing and roughly towel drying to remove loose skin Patient taking differently: Apply 1 application topically daily as needed (dryness).  01/27/14   Ghimire, Henreitta Leber,  MD  hydrocortisone 2.5 % cream Apply topically 2 (two) times daily. 03/17/17 03/17/18  Colon Branch, MD  hydrOXYzine (ATARAX/VISTARIL) 10 MG tablet Take 1 tablet (10 mg total) by mouth every 4 (four) hours as needed for itching. 01/11/17   Gareth Morgan, MD  OXYGEN Inhale 2 L into the lungs continuous.    [provider]  phenazopyridine (PYRIDIUM) 200 MG tablet Take 1 tablet (200 mg total) by mouth 3 (three) times daily. 31/51/76   Delora Fuel, MD  polyethylene glycol [facility] / Floria Raveling) packet Take 17 g by mouth daily as needed for mild constipation. 01/26/17   Debbe Odea, MD  potassium chloride SA (K-DUR,KLOR-CON) 20 MEQ tablet Take 20 mEq by mouth as needed (when congested).    [provider]    Physical Exam: Vitals:   04/30/17 0200 04/30/17 0215 04/30/17 0230 04/30/17 0245  BP: (!) 162/80 (!) 151/81 (!) 161/83 (!) 161/77  Pulse: 83 76 80 85  Resp:      Temp:      TempSrc:      SpO2: 96% 92% 95% 93%  Weight:      Height:        Constitutional: NAD, calm, comfortable Eyes: PERRL, lids and conjunctivae normal ENMT: Mucous membranes are moist. Posterior pharynx clear of any exudate or lesions.Normal dentition.  Neck: normal, supple, no masses, no thyromegaly Respiratory: clear to auscultation bilaterally, no wheezing, no crackles. Normal respiratory effort. No accessory muscle use.  Cardiovascular: Regular rate and rhythm, no murmurs / rubs / gallops. No extremity edema. 2+ pedal pulses. No carotid bruits.  Abdomen: no tenderness, no masses palpated. No hepatosplenomegaly. Bowel sounds positive.  Musculoskeletal: no clubbing / cyanosis. No joint deformity upper and lower extremities. Good ROM, no contractures. Normal muscle tone.  Skin: no rashes, lesions, ulcers. No induration Neurologic: CN 2-12 grossly intact. Sensation intact, DTR normal. Strength 5/5 in all 4.  Psychiatric: Normal judgment and insight. Alert and oriented x 3. Normal mood.    Labs on  Admission: I have personally reviewed following labs and imaging studies  CBC: Recent Labs  Lab 04/29/17 1955  WBC 13.4*  HGB 10.1*  HCT 33.3*  MCV 83.5  PLT 160   Basic Metabolic Panel: Recent Labs  Lab 04/29/17 1955  NA 139  K 4.0  CL 108  CO2 25  GLUCOSE 102*  BUN 34*  CREATININE 1.24  CALCIUM 9.6   GFR: Estimated Creatinine Clearance: 60.8 mL/min (by C-G formula based on SCr of 1.24 mg/dL). Liver Function Tests: Recent Labs  Lab 04/29/17 1955  AST 26  ALT 15*  ALKPHOS 120  BILITOT 0.4  PROT 5.6*  ALBUMIN 2.6*   Recent Labs  Lab  04/29/17 1955  LIPASE 24   No results for input(s): AMMONIA in the last 168 hours. Coagulation Profile: No results for input(s): INR, PROTIME in the last 168 hours. Cardiac Enzymes: No results for input(s): CKTOTAL, CKMB, CKMBINDEX, TROPONINI in the last 168 hours. BNP (last 3 results) No results for input(s): PROBNP in the last 8760 hours. HbA1C: No results for input(s): HGBA1C in the last 72 hours. CBG: No results for input(s): GLUCAP in the last 168 hours. Lipid Profile: No results for input(s): CHOL, HDL, LDLCALC, TRIG, CHOLHDL, LDLDIRECT in the last 72 hours. Thyroid Function Tests: No results for input(s): TSH, T4TOTAL, FREET4, T3FREE, THYROIDAB in the last 72 hours. Anemia Panel: No results for input(s): VITAMINB12, FOLATE, FERRITIN, TIBC, IRON, RETICCTPCT in the last 72 hours. Urine analysis:    Component Value Date/Time   COLORURINE YELLOW 04/29/2017 1915   APPEARANCEUR CLOUDY (A) 04/29/2017 1915   LABSPEC 1.012 04/29/2017 1915   PHURINE 6.0 04/29/2017 1915   GLUCOSEU NEGATIVE 04/29/2017 1915   GLUCOSEU NEGATIVE 05/02/2015 1008   HGBUR SMALL (A) 04/29/2017 1915   BILIRUBINUR NEGATIVE 04/29/2017 [place] 04/29/2017 1915   PROTEINUR >=300 (A) 04/29/2017 1915   UROBILINOGEN 0.2 05/02/2015 1008   NITRITE POSITIVE (A) 04/29/2017 1915   LEUKOCYTESUR LARGE (A) 04/29/2017 1915    Radiological  Exams on Admission: Ct Abdomen Pelvis W Contrast  Result Date: 04/30/2017 CLINICAL DATA:  Chronic severe groin pain, with infection.  Dysuria. EXAM: CT ABDOMEN AND PELVIS WITH CONTRAST TECHNIQUE: Multidetector CT imaging of the abdomen and pelvis was performed using the standard protocol following bolus administration of intravenous contrast. CONTRAST:  154mL ISOVUE-300 IOPAMIDOL (ISOVUE-300) INJECTION 61% COMPARISON:  CT of the abdomen and pelvis performed 12/23/2016, and renal ultrasound performed 02/08/2017 FINDINGS: Lower chest: Right basilar airspace opacity likely reflects atelectasis. Scattered coronary artery calcifications are seen. Hepatobiliary: The liver is unremarkable in appearance. The gallbladder is unremarkable in appearance. The common bile duct remains normal in caliber. Pancreas:  The pancreas is within normal limits. Spleen: An apparent peripheral infarct is noted within the spleen. The spleen is enlarged, measuring 17.9 cm in length. Adrenals/Urinary Tract: The adrenal glands are unremarkable in appearance. Nonspecific perinephric stranding is noted bilaterally. A right renal cyst is noted. There is mild wall enhancement along the right renal pelvis and proximal ureter, raising concern for right-sided pyelonephritis and ureteritis. No significant hydronephrosis is seen. No renal or ureteral stones are identified. Stomach/Bowel: The stomach is unremarkable in appearance. The small bowel is within normal limits. The appendix is normal in caliber, without evidence of appendicitis. Scattered diverticulosis is noted along the descending and sigmoid colon, without evidence of diverticulitis. Vascular/Lymphatic: Scattered calcification is seen along the abdominal aorta and its branches. The abdominal aorta is otherwise grossly unremarkable. The inferior vena cava is grossly unremarkable. No retroperitoneal lymphadenopathy is seen. No pelvic sidewall lymphadenopathy is identified. Reproductive:  There is diffuse irregular wall thickening about the bladder, with multiple bladder diverticula noted. The appearance raises concern for cystitis or chronic inflammation. The prostate remains normal in size. Other: A nonspecific 1.7 cm subcutaneous soft tissue nodule is noted at the left inguinal region. Underlying nodes are normal in size. Musculoskeletal: Diffuse heterogeneous sclerosis of the visualized osseous structures reflects the patient's myeloproliferative disorder. The visualized musculature is unremarkable in appearance. IMPRESSION: 1. Mild wall enhancement along the right renal pelvis and proximal right ureter, raising concern for right-sided pyelonephritis and ureteritis. No significant hydronephrosis seen. 2. Diffuse irregular wall thickening about the  bladder, with multiple bladder diverticula. The appearance raises concern for cystitis or chronic inflammation. 3. 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 4. Nonspecific 1.7 cm subcutaneous soft tissue nodule at the left inguinal region. Underlying lymph nodes are normal in size. 5. Right basilar airspace opacity likely reflects atelectasis. 6. Scattered coronary artery calcifications noted. 7. Scattered diverticulosis along the descending and sigmoid colon, without evidence of diverticulitis. 8. Diffuse heterogeneous sclerosis of the visualized osseous structures reflects the patient's myeloproliferative disorder. Aortic Atherosclerosis (ICD10-I70.0). Electronically Signed   By: Garald Balding M.D.   On: 04/30/2017 02:08    EKG: Independently reviewed.  Assessment/Plan Principal Problem:   Acute lower UTI Active Problems:   Essential hypertension   Chronic diastolic CHF (congestive heart failure) (HCC)   CKD (chronic kidney disease) stage 3, GFR 30-59 ml/min (HCC)   Pulmonary hypertension severe   Pyelonephritis    1. UTI and pyelo - 1. UCx and BCx 2. Rocephin (prior E.coli was also sensitive to  this) 2. HTN - 1. Continue hydralazine 3. Pulm HTN - 1. Continue O2 via [place] 4. Chronic CHF - 1. Will hold lasix with admission for the moment to "gently hydrate" patient in setting of infection 5. CKD stage 3 - chronic and stable 6. Debility due to myeloproliferative disease - (see discharge summary on 01/26/17) 1. Chronic and ongoing 2. Concerned that patient developing UTIs due to wet depends 3. Cannot afford SNF apparently  DVT prophylaxis: Lovenox Code Status: Full Family Communication: No family in room Disposition Plan: TBD Consults called: None Admission status: Admit to inpatient - inpatient status for failed outpatient therapy.   Etta Quill DO [facility] Pager [phone]  If 7AM-7PM, please contact day team taking care of patient [email or web address] Password [email or web address]  04/30/2017, 3:19 AM

## 2017-05-01 MED ORDER — URELLE 81 MG PO TABS
1.0000 | ORAL_TABLET | Freq: Three times a day (TID) | ORAL | Status: DC
  Administered 2017-05-01 – 2017-05-02 (×3): 81 mg via ORAL
  Filled 2017-05-01 (×5): qty 1

## 2017-05-01 MED ORDER — HYOSCYAMINE SULFATE ER 0.375 MG PO TB12
0.3750 mg | ORAL_TABLET | Freq: Once | ORAL | Status: AC
  Administered 2017-05-01: 0.375 mg via ORAL
  Filled 2017-05-01 (×2): qty 1

## 2017-05-01 NOTE — Progress Notes (Signed)
[facility]                                                                              Patient Demographics  Juan [name], is a 76 y.o. male, DOB - 1941/05/17, CWC:[id number]  Admit date - 04/29/2017   Admitting Physician Etta Quill, DO  Outpatient Primary MD for the patient is Colon Branch, MD  Outpatient specialists:   LOS - 1  days   Medical records reviewed and are as summarized below:    Chief Complaint  Patient presents with  . Abdominal Pain  . Urinary Tract Infection       Brief summary  Per admission note by Dr. Alcario Drought on 12/14 Juan [name] is a 76 y.o. male with medical history significant of chronic debility, myelo proliferative disorder, CHF, severe pulm HTN on 2L O2 at baseline, CKD stage 3.  Patient presents to the ED with c/o worsening dysuria.  Patient was diagnosed with E.Coli UTI on 11/26.  Treated with course of Keflex which it was sensitive to.  Initially symptoms improved some but not entirely, but after finishing course returned and got worse.  He reports dysuria, urinary frequency and urgency, lower abdominal pain.   Assessment & Plan    Principal Problem:   Acute lower UTI, right-sided pyelonephritis -Continue IV Rocephin, follow urine cultures, blood cultures -CT abdomen pelvis shows right-sided pyelonephritis, urethritis, no significant hydronephrosis, concern for cystitis or chronic inflammation.  Incidentally shown apparent acute infarct at the periphery of the spleen, splenomegaly -Urine culture showing more than 100,000 colonies of Pseudomonas, follow sensitivities -Complaining of significant dysuria, placed on urelle for 2 days   Active Problems:   Essential hypertension -Currently stable, continue hydralazine    Chronic diastolic CHF (congestive heart failure) (HCC) -Currently stable, compensated, hold Lasix today    CKD (chronic kidney disease) stage 3, GFR 30-59 ml/min (HCC) -Creatinine appears to be at baseline,  baseline creatinine 1.2-1.3    Pulmonary hypertension severe -Continue O2 via nasal cannula  Debility, myeloproliferative disease -PT OT evaluation   Code Status: Full CODE STATUS DVT Prophylaxis:  Lovenox Family Communication: Discussed in detail with the patient, all imaging results, lab results explained to the patient   Disposition Plan:  Time Spent in minutes 25 minutes  Procedures:    Consultants:     Antimicrobials:   IV Rocephin 12/14   Medications  Scheduled Meds: . aspirin  325 mg Oral Daily  . diclofenac sodium  4 g Topical QID  . enoxaparin (LOVENOX) injection  40 mg Subcutaneous Q24H  . feeding supplement (GLUCERNA SHAKE)  118.5 mL Oral Daily  . ferrous gluconate  324 mg Oral Daily  . fluticasone  2 spray Each Nare Daily  . hydrALAZINE  25 mg Oral TID  . hydrocortisone cream   Topical BID  . phenazopyridine  200 mg Oral TID   Continuous Infusions: . cefTRIAXone (ROCEPHIN)  IV Stopped (04/30/17 2309)   PRN Meds:.acetaminophen **OR** acetaminophen, gabapentin, hydrocerin, hydrOXYzine, ondansetron **OR** ondansetron (ZOFRAN) IV, polyethylene glycol   Antibiotics   Anti-infectives (From admission, onward)   Start     Dose/Rate Route Frequency Ordered  Stop   04/30/17 2200  cefTRIAXone (ROCEPHIN) 1 g in dextrose 5 % 50 mL IVPB  Status:  Discontinued     1 g 100 mL/hr over 30 Minutes Intravenous Every 24 hours 04/30/17 0252 04/30/17 0329   04/30/17 2200  cefTRIAXone (ROCEPHIN) 1 g in dextrose 5 % 50 mL IVPB     1 g 100 mL/hr over 30 Minutes Intravenous Every 24 hours 04/30/17 0330     04/29/17 2230  cefTRIAXone (ROCEPHIN) 1 g in dextrose 5 % 50 mL IVPB     1 g 100 mL/hr over 30 Minutes Intravenous  Once 04/29/17 2221 04/30/17 0346        Subjective:   Juan [name] was seen and examined today.  states has dysuria every time he urinates.  No fevers or chills. Patient denies dizziness, chest pain, shortness of breath, abdominal pain, N/V/D/C, new  weakness, numbess, tingling. No acute events overnight.    Objective:   Vitals:   04/30/17 1414 04/30/17 1620 04/30/17 1959 05/01/17 0620  BP: (!) 146/70 (!) 175/72 (!) 143/65 (!) 156/74  Pulse: 74  79 71  Resp: 20  18 16   Temp: 98.2 F (36.8 C)  98.9 F (37.2 C) 98.4 F (36.9 C)  TempSrc: Oral  Oral Oral  SpO2: 99%  97% 99%  Weight:      Height:        Intake/Output Summary (Last 24 hours) at 05/01/2017 1308 Last data filed at 05/01/2017 1100 Gross per 24 hour  Intake 410 ml  Output 1100 ml  Net -690 ml     Wt Readings from Last 3 Encounters:  04/29/17 83.5 kg (184 lb)  04/12/17 78.9 kg (174 lb)  03/30/17 78.9 kg (174 lb)     Exam  General: Alert and oriented x 3, NAD  Eyes:   HEENT:  Atraumatic, normocephalic  Cardiovascular: S1 S2 auscultated, no rubs, murmurs or gallops. Regular rate and rhythm.  Respiratory: Clear to auscultation bilaterally, no wheezing, rales or rhonchi  Gastrointestinal: Soft, nontender, nondistended, + bowel sounds  Ext: no pedal edema bilaterally  Neuro: no new deficits  Musculoskeletal: No digital cyanosis, clubbing  Skin: No rashes  Psych: Normal affect and demeanor, alert and oriented x3    Data Reviewed:  I have personally reviewed following labs and imaging studies  Micro Results Recent Results (from the past 240 hour(s))  Blood culture (routine x 2)     Status: None (Preliminary result)   Collection Time: 04/29/17 10:43 PM  Result Value Ref Range Status   Specimen Description BLOOD RIGHT HAND  Final   Special Requests IN PEDIATRIC BOTTLE Blood Culture adequate volume  Final   Culture NO GROWTH < 12 HOURS  Final   Report Status PENDING  Incomplete  Urine Culture     Status: Abnormal (Preliminary result)   Collection Time: 04/29/17 10:43 PM  Result Value Ref Range Status   Specimen Description URINE, RANDOM  Final   Special Requests NONE  Final   Culture (A)  Final    >=100,000 COLONIES/mL PSEUDOMONAS  AERUGINOSA SUSCEPTIBILITIES TO FOLLOW    Report Status PENDING  Incomplete  MRSA PCR Screening     Status: None   Collection Time: 04/30/17  4:28 PM  Result Value Ref Range Status   MRSA by PCR NEGATIVE NEGATIVE Final    Comment:        The GeneXpert MRSA Assay (FDA approved for NASAL specimens only), is one component of a comprehensive MRSA colonization surveillance  program. It is not intended to diagnose MRSA infection nor to guide or monitor treatment for MRSA infections.     Radiology Reports Ct Abdomen Pelvis W Contrast  Result Date: 04/30/2017 CLINICAL DATA:  Chronic severe groin pain, with infection.  Dysuria. EXAM: CT ABDOMEN AND PELVIS WITH CONTRAST TECHNIQUE: Multidetector CT imaging of the abdomen and pelvis was performed using the standard protocol following bolus administration of intravenous contrast. CONTRAST:  148mL ISOVUE-300 IOPAMIDOL (ISOVUE-300) INJECTION 61% COMPARISON:  CT of the abdomen and pelvis performed 12/23/2016, and renal ultrasound performed 02/08/2017 FINDINGS: Lower chest: Right basilar airspace opacity likely reflects atelectasis. Scattered coronary artery calcifications are seen. Hepatobiliary: The liver is unremarkable in appearance. The gallbladder is unremarkable in appearance. The common bile duct remains normal in caliber. Pancreas:  The pancreas is within normal limits. Spleen: An apparent peripheral infarct is noted within the spleen. The spleen is enlarged, measuring 17.9 cm in length. Adrenals/Urinary Tract: The adrenal glands are unremarkable in appearance. Nonspecific perinephric stranding is noted bilaterally. A right renal cyst is noted. There is mild wall enhancement along the right renal pelvis and proximal ureter, raising concern for right-sided pyelonephritis and ureteritis. No significant hydronephrosis is seen. No renal or ureteral stones are identified. Stomach/Bowel: The stomach is unremarkable in appearance. The small bowel is within  normal limits. The appendix is normal in caliber, without evidence of appendicitis. Scattered diverticulosis is noted along the descending and sigmoid colon, without evidence of diverticulitis. Vascular/Lymphatic: Scattered calcification is seen along the abdominal aorta and its branches. The abdominal aorta is otherwise grossly unremarkable. The inferior vena cava is grossly unremarkable. No retroperitoneal lymphadenopathy is seen. No pelvic sidewall lymphadenopathy is identified. Reproductive: There is diffuse irregular wall thickening about the bladder, with multiple bladder diverticula noted. The appearance raises concern for cystitis or chronic inflammation. The prostate remains normal in size. Other: A nonspecific 1.7 cm subcutaneous soft tissue nodule is noted at the left inguinal region. Underlying nodes are normal in size. Musculoskeletal: Diffuse heterogeneous sclerosis of the visualized osseous structures reflects the patient's myeloproliferative disorder. The visualized musculature is unremarkable in appearance. IMPRESSION: 1. Mild wall enhancement along the right renal pelvis and proximal right ureter, raising concern for right-sided pyelonephritis and ureteritis. No significant hydronephrosis seen. 2. Diffuse irregular wall thickening about the bladder, with multiple bladder diverticula. The appearance raises concern for cystitis or chronic inflammation. 3. 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 4. Nonspecific 1.7 cm subcutaneous soft tissue nodule at the left inguinal region. Underlying lymph nodes are normal in size. 5. Right basilar airspace opacity likely reflects atelectasis. 6. Scattered coronary artery calcifications noted. 7. Scattered diverticulosis along the descending and sigmoid colon, without evidence of diverticulitis. 8. Diffuse heterogeneous sclerosis of the visualized osseous structures reflects the patient's myeloproliferative disorder. Aortic  Atherosclerosis (ICD10-I70.0). Electronically Signed   By: Garald Balding M.D.   On: 04/30/2017 02:08    Lab Data:  CBC: Recent Labs  Lab 04/29/17 1955  WBC 13.4*  HGB 10.1*  HCT 33.3*  MCV 83.5  PLT 469   Basic Metabolic Panel: Recent Labs  Lab 04/29/17 1955  NA 139  K 4.0  CL 108  CO2 25  GLUCOSE 102*  BUN 34*  CREATININE 1.24  CALCIUM 9.6   GFR: Estimated Creatinine Clearance: 60.8 mL/min (by C-G formula based on SCr of 1.24 mg/dL). Liver Function Tests: Recent Labs  Lab 04/29/17 1955  AST 26  ALT 15*  ALKPHOS 120  BILITOT 0.4  PROT 5.6*  ALBUMIN 2.6*   Recent Labs  Lab 04/29/17 1955  LIPASE 24   No results for input(s): AMMONIA in the last 168 hours. Coagulation Profile: No results for input(s): INR, PROTIME in the last 168 hours. Cardiac Enzymes: No results for input(s): CKTOTAL, CKMB, CKMBINDEX, TROPONINI in the last 168 hours. BNP (last 3 results) No results for input(s): PROBNP in the last 8760 hours. HbA1C: No results for input(s): HGBA1C in the last 72 hours. CBG: No results for input(s): GLUCAP in the last 168 hours. Lipid Profile: No results for input(s): CHOL, HDL, LDLCALC, TRIG, CHOLHDL, LDLDIRECT in the last 72 hours. Thyroid Function Tests: No results for input(s): TSH, T4TOTAL, FREET4, T3FREE, THYROIDAB in the last 72 hours. Anemia Panel: No results for input(s): VITAMINB12, FOLATE, FERRITIN, TIBC, IRON, RETICCTPCT in the last 72 hours. Urine analysis:    Component Value Date/Time   COLORURINE YELLOW 04/29/2017 1915   APPEARANCEUR CLOUDY (A) 04/29/2017 1915   LABSPEC 1.012 04/29/2017 1915   PHURINE 6.0 04/29/2017 1915   GLUCOSEU NEGATIVE 04/29/2017 1915   GLUCOSEU NEGATIVE 05/02/2015 1008   HGBUR SMALL (A) 04/29/2017 1915   BILIRUBINUR NEGATIVE 04/29/2017 1915   KETONESUR NEGATIVE 04/29/2017 1915   PROTEINUR >=300 (A) 04/29/2017 1915   UROBILINOGEN 0.2 05/02/2015 1008   NITRITE POSITIVE (A) 04/29/2017 1915   LEUKOCYTESUR  LARGE (A) 04/29/2017 1915     Jonesha Tsuchiya M.D. [facility] 05/01/2017, 1:08 PM  Pager: [phone] Between 7am to 7pm - call Pager - [phone]  After 7pm go to [email or web address] - password [email or web address]  Call night coverage person covering after 7pm

## 2017-05-02 LAB — CBC
HCT: 30 % — ABNORMAL LOW (ref 39.0–52.0)
HEMOGLOBIN: 8.9 g/dL — AB (ref 13.0–17.0)
MCH: 25.1 pg — ABNORMAL LOW (ref 26.0–34.0)
MCHC: 29.7 g/dL — ABNORMAL LOW (ref 30.0–36.0)
MCV: 84.7 fL (ref 78.0–100.0)
PLATELETS: 344 10*3/uL (ref 150–400)
RBC: 3.54 MIL/uL — AB (ref 4.22–5.81)
RDW: 18.5 % — ABNORMAL HIGH (ref 11.5–15.5)
WBC: 14.1 10*3/uL — AB (ref 4.0–10.5)

## 2017-05-02 LAB — BASIC METABOLIC PANEL
ANION GAP: 6 (ref 5–15)
BUN: 36 mg/dL — ABNORMAL HIGH (ref 6–20)
CHLORIDE: 108 mmol/L (ref 101–111)
CO2: 25 mmol/L (ref 22–32)
Calcium: 9.1 mg/dL (ref 8.9–10.3)
Creatinine, Ser: 1.32 mg/dL — ABNORMAL HIGH (ref 0.61–1.24)
GFR, EST AFRICAN AMERICAN: 59 mL/min — AB (ref >60)
GFR, EST NON AFRICAN AMERICAN: 51 mL/min — AB (ref >60)
Glucose, Bld: 107 mg/dL — ABNORMAL HIGH (ref 65–99)
POTASSIUM: 4.4 mmol/L (ref 3.5–5.1)
SODIUM: 139 mmol/L (ref 135–145)

## 2017-05-02 LAB — URINE CULTURE: Culture: >=100000 — AB

## 2017-05-02 MED ORDER — URELLE 81 MG PO TABS
1.0000 | ORAL_TABLET | Freq: Four times a day (QID) | ORAL | Status: AC
  Administered 2017-05-02 – 2017-05-04 (×8): 81 mg via ORAL
  Filled 2017-05-02 (×8): qty 1

## 2017-05-02 MED ORDER — PIPERACILLIN-TAZOBACTAM 3.375 G IVPB
3.3750 g | Freq: Three times a day (TID) | INTRAVENOUS | Status: DC
  Administered 2017-05-02 – 2017-05-05 (×9): 3.375 g via INTRAVENOUS
  Filled 2017-05-02 (×10): qty 50

## 2017-05-02 NOTE — Plan of Care (Signed)
Pt currently resting in bed with eyes closed, no acute issues overnight.

## 2017-05-02 NOTE — Progress Notes (Addendum)
ANTIBIOTIC CONSULT NOTE - INITIAL  Pharmacy Consult for Zosyn Indication: PSA UTI  No Known Allergies  Patient Measurements: Height: 6\' 3"  (190.5 cm) Weight: 184 lb (83.5 kg) IBW/kg (Calculated) : 84.5 Adjusted Body Weight:    Vital Signs: Temp: 98.7 F (37.1 C) (12/16 0540) Temp Source: Oral (12/16 0540) BP: 130/61 (12/16 0540) Pulse Rate: 96 (12/16 0540) Intake/Output from previous day: 12/15 0701 - 12/16 0700 In: 180 [P.O.:180] Out: 1300 [Urine:1300] Intake/Output from this shift: No intake/output data recorded.  Labs: Recent Labs    04/29/17 1955 05/02/17 0622  WBC 13.4* 14.1*  HGB 10.1* 8.9*  PLT 330 344  CREATININE 1.24 1.32*   Estimated Creatinine Clearance: 57.1 mL/min (A) (by C-G formula based on SCr of 1.32 mg/dL (H)). No results for input(s): VANCOTROUGH, VANCOPEAK, VANCORANDOM, GENTTROUGH, GENTPEAK, GENTRANDOM, TOBRATROUGH, TOBRAPEAK, TOBRARND, AMIKACINPEAK, AMIKACINTROU, AMIKACIN in the last 72 hours.   Microbiology:   Medical History: Past Medical History:  Diagnosis Date  . Allergic rhinitis   . Anemia   . Ascending aortic aneurysm ([place]) 03/2014   4.3cm on CT scan  . CAD (coronary artery disease)    dx elsewheer in past, no documentation. Non-ischemic myoview 2007  . Chronic diastolic CHF (congestive heart failure), NYHA class 2 (HCC)    Normal EF w/ grade 1 dd by echo 12/2015  . Edema    R>L leg, u/s 5-12 neg for DVT  . Hemorrhoid   . History of thrombocytosis   . Hypertension   . Iron deficiency anemia due to chronic blood loss 03/31/2017  . 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 Migraine    "once/wk at least" (07/11/2013)  . Myeloproliferative disease ([place])   . Shortness of breath   . Sinus congestion   . Sleep apnea, obstructive    at some point used CPAP, was d/c  years ago  . Type II diabetes mellitus (HCC)     Assessment: CC/HPI:   PMH: chronic debility, myelo proliferative disorder, CHF, severe pulm HTN, CKD3, E.Coli UTI on  11/26.    ID: Acute Pseudomonas lower UTI, right-sided pyelonephritis, urethritis,  (had UTI 11/16 treated with Keflex)  Tmax 99. WBC 14.1, Scr 1.32 at baseline  Goal of Therapy:  Eradication of infection  Plan:  Zosyn 3.375g IV q 8hr. Pharmacy will sign off. Please reconsult for further dosing assitance. Pyridium is not recommended to be administered for > 2 day. Please consider d/c Pyridium.   Jacarie Pate S. Alford Highland, PharmD, BCPS Clinical Staff Pharmacist Pager [phone]  Eilene Ghazi Stillinger 05/02/2017,12:41 PM

## 2017-05-02 NOTE — Progress Notes (Signed)
[facility]                                                                              Patient Demographics  Juan [name], is a 76 y.o. male, DOB - July 21, 1940, TXM:[id number]  Admit date - 04/29/2017   Admitting Physician Etta Quill, DO  Outpatient Primary MD for the patient is Colon Branch, MD  Outpatient specialists:   LOS - 2  days   Medical records reviewed and are as summarized below:    Chief Complaint  Patient presents with  . Abdominal Pain  . Urinary Tract Infection       Brief summary  Per admission note by Dr. Alcario Drought on 12/14 Juan [name] is a 76 y.o. male with medical history significant of chronic debility, myelo proliferative disorder, CHF, severe pulm HTN on 2L O2 at baseline, CKD stage 3.  Patient presents to the ED with c/o worsening dysuria.  Patient was diagnosed with E.Coli UTI on 11/26.  Treated with course of Keflex which it was sensitive to.  Initially symptoms improved some but not entirely, but after finishing course returned and got worse.  He reports dysuria, urinary frequency and urgency, lower abdominal pain.   Assessment & Plan    Principal Problem:   Acute Pseudomonas lower UTI, right-sided pyelonephritis -CT abdomen pelvis shows right-sided pyelonephritis, urethritis, no significant hydronephrosis, concern for cystitis or chronic inflammation.  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   Active Problems:   Essential hypertension -Currently stable, continue hydralazine    Chronic diastolic CHF (congestive heart failure) (HCC) -Negative balance of 2.1 L, compensated, stable, continue to hold Lasix until patient eating good.       CKD (chronic kidney disease) stage 3, GFR 30-59 ml/min (HCC) -Creatinine appears to be at baseline,  baseline creatinine 1.2-1.3 -Continue to hold Lasix    Pulmonary hypertension severe -Continue O2 via nasal cannula  Debility, myeloproliferative disease -PT OT evaluation pending   Code Status: Full CODE STATUS DVT Prophylaxis:  Lovenox Family Communication: Discussed in detail with the patient, all imaging results, lab results explained to the patient   Disposition Plan:  Time Spent in minutes 25 minutes  Procedures:    Consultants:     Antimicrobials:   IV Rocephin 12/14   Medications  Scheduled Meds: . aspirin  325 mg Oral Daily  . diclofenac sodium  4 g Topical QID  . enoxaparin (LOVENOX) injection  40 mg Subcutaneous Q24H  . feeding supplement (GLUCERNA SHAKE)  118.5 mL Oral Daily  . ferrous gluconate  324 mg Oral Daily  . fluticasone  2 spray Each Nare Daily  . hydrALAZINE  25 mg Oral TID  . hydrocortisone cream   Topical BID  . phenazopyridine  200 mg Oral TID  . URELLE  1 tablet Oral TID   Continuous Infusions: . piperacillin-tazobactam (ZOSYN)  IV     PRN Meds:.acetaminophen **OR** acetaminophen, gabapentin, hydrocerin, hydrOXYzine, ondansetron **OR** ondansetron (ZOFRAN) IV, polyethylene glycol  Antibiotics   Anti-infectives (From admission, onward)   Start     Dose/Rate Route Frequency Ordered Stop   05/02/17 1300  piperacillin-tazobactam (ZOSYN) IVPB 3.375 g     3.375 g 12.5 mL/hr over 240 Minutes Intravenous Every 8 hours 05/02/17 1211     04/30/17 2200  cefTRIAXone (ROCEPHIN) 1 g in dextrose 5 % 50 mL IVPB  Status:  Discontinued     1 g 100 mL/hr over 30 Minutes Intravenous Every 24 hours 04/30/17 0252 04/30/17 0329   04/30/17 2200  cefTRIAXone (ROCEPHIN) 1 g in dextrose 5 % 50 mL IVPB  Status:  Discontinued     1 g 100 mL/hr over 30 Minutes Intravenous Every 24 hours 04/30/17 0330 05/02/17 1211   04/29/17 2230  cefTRIAXone (ROCEPHIN) 1 g in dextrose 5 % 50 mL IVPB     1 g 100 mL/hr over 30 Minutes Intravenous  Once 04/29/17 2221 04/30/17  0346        Subjective:   Reinaldo Helt was seen and examined today.  States had a rough night, did not sleep well due to right flank pain, abdominal pain, dysuria is improving.  low-grade temp of 99 F. patient denies dizziness, chest pain, shortness of breath, abdominal pain, N/V/D/C, new weakness, numbess, tingling.   Objective:   Vitals:   05/01/17 0620 05/01/17 1604 05/01/17 2029 05/02/17 0540  BP: (!) 156/74 (!) 146/68 (!) 153/87 130/61  Pulse: 71 82 79 96  Resp: 16 16 17 17   Temp: 98.4 F (36.9 C) 98.8 F (37.1 C) 99 F (37.2 C) 98.7 F (37.1 C)  TempSrc: Oral Oral Oral Oral  SpO2: 99% 95% 92% 96%  Weight:      Height:        Intake/Output Summary (Last 24 hours) at 05/02/2017 1228 Last data filed at 05/02/2017 0541 Gross per 24 hour  Intake 180 ml  Output 1100 ml  Net -920 ml     Wt Readings from Last 3 Encounters:  04/29/17 83.5 kg (184 lb)  04/12/17 78.9 kg (174 lb)  03/30/17 78.9 kg (174 lb)     Exam  General: Alert and oriented x 3, NAD Eyes:  HEENT:   Cardiovascular: S1 S2 auscultated, no rubs, murmurs or gallops. Regular rate and rhythm. No pedal edema b/l Respiratory: Clear to auscultation bilaterally, no wheezing, rales or rhonchi Gastrointestinal: Soft, nontender, nondistended, + bowel sounds, right CVAT Ext: no pedal edema bilaterally Neuro: no new deficits Musculoskeletal: No digital cyanosis, clubbing Skin: No rashes Psych: Normal affect and demeanor, alert and oriented x3      Data Reviewed:  I have personally reviewed following labs and imaging studies  Micro Results Recent Results (from the past 240 hour(s))  Blood culture (routine x 2)     Status: None (Preliminary result)   Collection Time: 04/29/17 10:43 PM  Result Value Ref Range Status   Specimen Description BLOOD RIGHT HAND  Final   Special Requests IN PEDIATRIC BOTTLE Blood Culture adequate volume  Final   Culture NO GROWTH 2 DAYS  Final   Report Status PENDING   Incomplete  Urine Culture     Status: Abnormal   Collection Time: 04/29/17 10:43 PM  Result Value Ref Range Status   Specimen Description URINE, RANDOM  Final   Special Requests NONE  Final   Culture >=100,000 COLONIES/mL PSEUDOMONAS AERUGINOSA (A)  Final   Report Status 05/02/2017 FINAL  Final   Organism ID, Bacteria PSEUDOMONAS AERUGINOSA (A)  Final  Susceptibility   Pseudomonas aeruginosa - MIC*    CEFTAZIDIME 4 SENSITIVE Sensitive     CIPROFLOXACIN >=4 RESISTANT Resistant     GENTAMICIN 4 SENSITIVE Sensitive     IMIPENEM 2 SENSITIVE Sensitive     PIP/TAZO 32 SENSITIVE Sensitive     CEFEPIME 8 SENSITIVE Sensitive     * >=100,000 COLONIES/mL PSEUDOMONAS AERUGINOSA  Blood culture (routine x 2)     Status: None (Preliminary result)   Collection Time: 04/29/17 11:23 PM  Result Value Ref Range Status   Specimen Description BLOOD BLOOD RIGHT FOREARM  Final   Special Requests   Final    BOTTLES DRAWN AEROBIC AND ANAEROBIC Blood Culture adequate volume   Culture NO GROWTH 1 DAY  Final   Report Status PENDING  Incomplete  MRSA PCR Screening     Status: None   Collection Time: 04/30/17  4:28 PM  Result Value Ref Range Status   MRSA by PCR NEGATIVE NEGATIVE Final    Comment:        The GeneXpert MRSA Assay (FDA approved for NASAL specimens only), is one component of a comprehensive MRSA colonization surveillance program. It is not intended to diagnose MRSA infection nor to guide or monitor treatment for MRSA infections.     Radiology Reports Ct Abdomen Pelvis W Contrast  Result Date: 04/30/2017 CLINICAL DATA:  Chronic severe groin pain, with infection.  Dysuria. EXAM: CT ABDOMEN AND PELVIS WITH CONTRAST TECHNIQUE: Multidetector CT imaging of the abdomen and pelvis was performed using the standard protocol following bolus administration of intravenous contrast. CONTRAST:  149mL ISOVUE-300 IOPAMIDOL (ISOVUE-300) INJECTION 61% COMPARISON:  CT of the abdomen and pelvis  performed 12/23/2016, and renal ultrasound performed 02/08/2017 FINDINGS: Lower chest: Right basilar airspace opacity likely reflects atelectasis. Scattered coronary artery calcifications are seen. Hepatobiliary: The liver is unremarkable in appearance. The gallbladder is unremarkable in appearance. The common bile duct remains normal in caliber. Pancreas:  The pancreas is within normal limits. Spleen: An apparent peripheral infarct is noted within the spleen. The spleen is enlarged, measuring 17.9 cm in length. Adrenals/Urinary Tract: The adrenal glands are unremarkable in appearance. Nonspecific perinephric stranding is noted bilaterally. A right renal cyst is noted. There is mild wall enhancement along the right renal pelvis and proximal ureter, raising concern for right-sided pyelonephritis and ureteritis. No significant hydronephrosis is seen. No renal or ureteral stones are identified. Stomach/Bowel: The stomach is unremarkable in appearance. The small bowel is within normal limits. The appendix is normal in caliber, without evidence of appendicitis. Scattered diverticulosis is noted along the descending and sigmoid colon, without evidence of diverticulitis. Vascular/Lymphatic: Scattered calcification is seen along the abdominal aorta and its branches. The abdominal aorta is otherwise grossly unremarkable. The inferior vena cava is grossly unremarkable. No retroperitoneal lymphadenopathy is seen. No pelvic sidewall lymphadenopathy is identified. Reproductive: There is diffuse irregular wall thickening about the bladder, with multiple bladder diverticula noted. The appearance raises concern for cystitis or chronic inflammation. The prostate remains normal in size. Other: A nonspecific 1.7 cm subcutaneous soft tissue nodule is noted at the left inguinal region. Underlying nodes are normal in size. Musculoskeletal: Diffuse heterogeneous sclerosis of the visualized osseous structures reflects the patient's  myeloproliferative disorder. The visualized musculature is unremarkable in appearance. IMPRESSION: 1. Mild wall enhancement along the right renal pelvis and proximal right ureter, raising concern for right-sided pyelonephritis and ureteritis. No significant hydronephrosis seen. 2. Diffuse irregular wall thickening about the bladder, with multiple bladder diverticula. The appearance raises concern  for cystitis or chronic inflammation. 3. 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 4. Nonspecific 1.7 cm subcutaneous soft tissue nodule at the left inguinal region. Underlying lymph nodes are normal in size. 5. Right basilar airspace opacity likely reflects atelectasis. 6. Scattered coronary artery calcifications noted. 7. Scattered diverticulosis along the descending and sigmoid colon, without evidence of diverticulitis. 8. Diffuse heterogeneous sclerosis of the visualized osseous structures reflects the patient's myeloproliferative disorder. Aortic Atherosclerosis (ICD10-I70.0). Electronically Signed   By: Garald Balding M.D.   On: 04/30/2017 02:08    Lab Data:  CBC: Recent Labs  Lab 04/29/17 1955 05/02/17 0622  WBC 13.4* 14.1*  HGB 10.1* 8.9*  HCT 33.3* 30.0*  MCV 83.5 84.7  PLT 330 700   Basic Metabolic Panel: Recent Labs  Lab 04/29/17 1955 05/02/17 0622  NA 139 139  K 4.0 4.4  CL 108 108  CO2 25 25  GLUCOSE 102* 107*  BUN 34* 36*  CREATININE 1.24 1.32*  CALCIUM 9.6 9.1   GFR: Estimated Creatinine Clearance: 57.1 mL/min (A) (by C-G formula based on SCr of 1.32 mg/dL (H)). Liver Function Tests: Recent Labs  Lab 04/29/17 1955  AST 26  ALT 15*  ALKPHOS 120  BILITOT 0.4  PROT 5.6*  ALBUMIN 2.6*   Recent Labs  Lab 04/29/17 1955  LIPASE 24   No results for input(s): AMMONIA in the last 168 hours. Coagulation Profile: No results for input(s): INR, PROTIME in the last 168 hours. Cardiac Enzymes: No results for input(s): CKTOTAL, CKMB,  CKMBINDEX, TROPONINI in the last 168 hours. BNP (last 3 results) No results for input(s): PROBNP in the last 8760 hours. HbA1C: No results for input(s): HGBA1C in the last 72 hours. CBG: No results for input(s): GLUCAP in the last 168 hours. Lipid Profile: No results for input(s): CHOL, HDL, LDLCALC, TRIG, CHOLHDL, LDLDIRECT in the last 72 hours. Thyroid Function Tests: No results for input(s): TSH, T4TOTAL, FREET4, T3FREE, THYROIDAB in the last 72 hours. Anemia Panel: No results for input(s): VITAMINB12, FOLATE, FERRITIN, TIBC, IRON, RETICCTPCT in the last 72 hours. Urine analysis:    Component Value Date/Time   COLORURINE YELLOW 04/29/2017 1915   APPEARANCEUR CLOUDY (A) 04/29/2017 1915   LABSPEC 1.012 04/29/2017 1915   PHURINE 6.0 04/29/2017 1915   GLUCOSEU NEGATIVE 04/29/2017 1915   GLUCOSEU NEGATIVE 05/02/2015 1008   HGBUR SMALL (A) 04/29/2017 1915   BILIRUBINUR NEGATIVE 04/29/2017 1915   KETONESUR NEGATIVE 04/29/2017 1915   PROTEINUR >=300 (A) 04/29/2017 1915   UROBILINOGEN 0.2 05/02/2015 1008   NITRITE POSITIVE (A) 04/29/2017 1915   LEUKOCYTESUR LARGE (A) 04/29/2017 1915     Ripudeep Rai M.D. [facility] 05/02/2017, 12:28 PM  Pager: [phone] Between 7am to 7pm - call Pager - [phone]  After 7pm go to [email or web address] - password [email or web address]  Call night coverage person covering after 7pm

## 2017-05-03 ENCOUNTER — Encounter (HOSPITAL_COMMUNITY): Payer: Self-pay | Admitting: General Practice

## 2017-05-03 LAB — BASIC METABOLIC PANEL
ANION GAP: 8 (ref 5–15)
BUN: 34 mg/dL — ABNORMAL HIGH (ref 6–20)
CHLORIDE: 110 mmol/L (ref 101–111)
CO2: 23 mmol/L (ref 22–32)
CREATININE: 1.38 mg/dL — AB (ref 0.61–1.24)
Calcium: 9.3 mg/dL (ref 8.9–10.3)
GFR calc non Af Amer: 48 mL/min — ABNORMAL LOW (ref >60)
GFR, EST AFRICAN AMERICAN: 56 mL/min — AB (ref >60)
GLUCOSE: 85 mg/dL (ref 65–99)
Potassium: 4.4 mmol/L (ref 3.5–5.1)
Sodium: 141 mmol/L (ref 135–145)

## 2017-05-03 LAB — URIC ACID: URIC ACID, SERUM: 8.7 mg/dL — AB (ref 4.4–7.6)

## 2017-05-03 LAB — C-REACTIVE PROTEIN: CRP: 3.2 mg/dL — AB (ref <1.0)

## 2017-05-03 LAB — SEDIMENTATION RATE: Sed Rate: 69 mm/hr — ABNORMAL HIGH (ref 0–16)

## 2017-05-03 MED ORDER — PREDNISONE 50 MG PO TABS
60.0000 mg | ORAL_TABLET | Freq: Every day | ORAL | Status: AC
  Administered 2017-05-03 – 2017-05-05 (×3): 60 mg via ORAL
  Filled 2017-05-03 (×3): qty 1

## 2017-05-03 MED ORDER — ALLOPURINOL 300 MG PO TABS
150.0000 mg | ORAL_TABLET | Freq: Every day | ORAL | Status: DC
  Administered 2017-05-03 – 2017-05-08 (×6): 150 mg via ORAL
  Filled 2017-05-03 (×6): qty 1

## 2017-05-03 MED ORDER — PANTOPRAZOLE SODIUM 40 MG PO TBEC
40.0000 mg | DELAYED_RELEASE_TABLET | Freq: Every day | ORAL | Status: DC
  Administered 2017-05-03 – 2017-05-08 (×6): 40 mg via ORAL
  Filled 2017-05-03 (×6): qty 1

## 2017-05-03 MED ORDER — KETOROLAC TROMETHAMINE 15 MG/ML IJ SOLN
15.0000 mg | Freq: Once | INTRAMUSCULAR | Status: AC
  Administered 2017-05-03: 15 mg via INTRAVENOUS
  Filled 2017-05-03: qty 1

## 2017-05-03 MED ORDER — COLCHICINE 0.6 MG PO TABS
0.6000 mg | ORAL_TABLET | Freq: Every day | ORAL | Status: DC
  Administered 2017-05-03 – 2017-05-08 (×6): 0.6 mg via ORAL
  Filled 2017-05-03 (×6): qty 1

## 2017-05-03 NOTE — Progress Notes (Signed)
PT Cancellation Note  Patient Details Name: Juan [name] MRN: [id number] DOB: Dec 01, 1940   Cancelled Treatment:    Attempted to perform skilled PT evaluation this afternoon, patient refuses stating "I just got back into the bed, I'll do it tomorrow"; unable to convince patient to participate in PT evaluation this afternoon.   Deniece Ree PT, DPT, CBIS  Supplemental Physical Therapist [facility]

## 2017-05-03 NOTE — Progress Notes (Signed)
[facility]                                                                              Patient Demographics  Juan [name], is a 76 y.o. male, DOB - 1940-06-08, MVH:[id number]  Admit date - 04/29/2017   Admitting Physician Etta Quill, DO  Outpatient Primary MD for the patient is Colon Branch, MD  Outpatient specialists:   LOS - 3  days   Medical records reviewed and are as summarized below:    Chief Complaint  Patient presents with  . Abdominal Pain  . Urinary Tract Infection       Brief summary  Per admission note by Dr. Alcario Drought on 12/14 Juan [name] is a 76 y.o. male with medical history significant of chronic debility, myelo proliferative disorder, CHF, severe pulm HTN on 2L O2 at baseline, CKD stage 3.  Patient presents to the ED with c/o worsening dysuria.  Patient was diagnosed with E.Coli UTI on 11/26.  Treated with course of Keflex which it was sensitive to.  Initially symptoms improved some but not entirely, but after finishing course returned and got worse.  He reports dysuria, urinary frequency and urgency, lower abdominal pain.   Assessment & Plan    Principal Problem:   Acute Pseudomonas lower UTI, right-sided pyelonephritis -CT abdomen pelvis shows right-sided pyelonephritis, urethritis, no significant hydronephrosis, concern for cystitis or chronic inflammation.  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  May benefit from changing antibiotic to carbapenem for easier outpatient dosing.  Patient states he lives at home with his son.  Will need PICC line.  Will obtain PT evaluation if he needs skilled nursing facility versus home health.  Active Problems: Acute gout right hand -Patient reports that he woke up this morning with significant pain in his  right hand, feels miserable.  On examination does not appears to have any fracture, range of movement  -Ordered ESR, CRP, uric acid, elevated.  Placed on colchicine, allopurinol, prednisone.  1 dose of Toradol 15 mg IV x1    Essential hypertension -Currently stable, continue hydralazine    Chronic diastolic CHF (congestive heart failure) (HCC) -Negative balance of 3.2 L, compensated, stable.   - continue to hold Lasix until patient eating good.       CKD (chronic kidney disease) stage 3, GFR 30-59 ml/min (HCC) -Creatinine appears to be at baseline, baseline creatinine 1.2-1.3 -Continue to hold Lasix    Pulmonary hypertension severe -Continue O2 via nasal cannula  Debility, myeloproliferative disease -PT OT evaluation pending   Code Status: Full CODE STATUS DVT Prophylaxis:  Lovenox Family Communication: Discussed in detail with the patient, all imaging results, lab results explained to the patient   Disposition Plan:  Time Spent in minutes 25 minutes  Procedures:    Consultants:     Antimicrobials:   IV Rocephin 12/14> 12/16  IV Zosyn 12/16>    Medications  Scheduled Meds: . allopurinol  150 mg Oral Daily  .  aspirin  325 mg Oral Daily  . colchicine  0.6 mg Oral Daily  . diclofenac sodium  4 g Topical QID  . enoxaparin (LOVENOX) injection  40 mg Subcutaneous Q24H  . feeding supplement (GLUCERNA SHAKE)  118.5 mL Oral Daily  . ferrous gluconate  324 mg Oral Daily  . fluticasone  2 spray Each Nare Daily  . hydrALAZINE  25 mg Oral TID  . hydrocortisone cream   Topical BID  . ketorolac  15 mg Intravenous Once  . pantoprazole  40 mg Oral Q0600  . predniSONE  60 mg Oral Q breakfast  . URELLE  1 tablet Oral QID   Continuous Infusions: . piperacillin-tazobactam (ZOSYN)  IV Stopped (05/03/17 1037)   PRN Meds:.acetaminophen **OR** acetaminophen, gabapentin, hydrocerin, hydrOXYzine, ondansetron **OR** ondansetron (ZOFRAN) IV, polyethylene glycol   Antibiotics    Anti-infectives (From admission, onward)   Start     Dose/Rate Route Frequency Ordered Stop   05/02/17 1300  piperacillin-tazobactam (ZOSYN) IVPB 3.375 g     3.375 g 12.5 mL/hr over 240 Minutes Intravenous Every 8 hours 05/02/17 1211     04/30/17 2200  cefTRIAXone (ROCEPHIN) 1 g in dextrose 5 % 50 mL IVPB  Status:  Discontinued     1 g 100 mL/hr over 30 Minutes Intravenous Every 24 hours 04/30/17 0252 04/30/17 0329   04/30/17 2200  cefTRIAXone (ROCEPHIN) 1 g in dextrose 5 % 50 mL IVPB  Status:  Discontinued     1 g 100 mL/hr over 30 Minutes Intravenous Every 24 hours 04/30/17 0330 05/02/17 1211   04/29/17 2230  cefTRIAXone (ROCEPHIN) 1 g in dextrose 5 % 50 mL IVPB     1 g 100 mL/hr over 30 Minutes Intravenous  Once 04/29/17 2221 04/30/17 0346        Subjective:   Barbara Keng was seen and examined today.  Afebrile.  States dysuria is improving.  Abdominal pain is improving however this morning woke up with severe right hand pain.  He also feels his right hand is a little bit swollen compared to left. Patient denies dizziness, chest pain, shortness of breath, abdominal pain, N/V/D/C, new weakness, numbess, tingling.   Objective:   Vitals:   05/02/17 0540 05/02/17 1447 05/02/17 2007 05/03/17 0441  BP: 130/61 137/63 (!) 159/70 (!) 141/64  Pulse: 96 71 69 61  Resp: '17 17 18 17  '$ Temp: 98.7 F (37.1 C) 98 F (36.7 C) 98 F (36.7 C) (!) 97.5 F (36.4 C)  TempSrc: Oral Oral Oral Oral  SpO2: 96% 97% 98% 98%  Weight:      Height:        Intake/Output Summary (Last 24 hours) at 05/03/2017 1153 Last data filed at 05/03/2017 1141 Gross per 24 hour  Intake 450 ml  Output 1550 ml  Net -1100 ml     Wt Readings from Last 3 Encounters:  04/29/17 83.5 kg (184 lb)  04/12/17 78.9 kg (174 lb)  03/30/17 78.9 kg (174 lb)     Exam  Physical Exam  General: Alert and oriented x 3, NAD  Eyes:   HEENT:    Cardiovascular: S1 S2 auscultated, no rubs, murmurs or gallops. Regular  rate and rhythm. No pedal edema b/l  Respiratory: Clear to auscultation bilaterally, no wheezing, rales or rhonchi  Gastrointestinal: Soft, nontender, nondistended, + bowel sounds  Ext: no pedal edema bilaterally  Neuro: no new deficits  Musculoskeletal: Minimal right hand swelling compared to left, feels pain in the right hand.  Range  of movement mildly affected due to pain.  Skin: No rashes  Psych: Normal affect and demeanor, alert and oriented x3    Data Reviewed:  I have personally reviewed following labs and imaging studies  Micro Results Recent Results (from the past 240 hour(s))  Blood culture (routine x 2)     Status: None (Preliminary result)   Collection Time: 04/29/17 10:43 PM  Result Value Ref Range Status   Specimen Description BLOOD RIGHT HAND  Final   Special Requests IN PEDIATRIC BOTTLE Blood Culture adequate volume  Final   Culture NO GROWTH 3 DAYS  Final   Report Status PENDING  Incomplete  Urine Culture     Status: Abnormal   Collection Time: 04/29/17 10:43 PM  Result Value Ref Range Status   Specimen Description URINE, RANDOM  Final   Special Requests NONE  Final   Culture >=100,000 COLONIES/mL PSEUDOMONAS AERUGINOSA (A)  Final   Report Status 05/02/2017 FINAL  Final   Organism ID, Bacteria PSEUDOMONAS AERUGINOSA (A)  Final      Susceptibility   Pseudomonas aeruginosa - MIC*    CEFTAZIDIME 4 SENSITIVE Sensitive     CIPROFLOXACIN >=4 RESISTANT Resistant     GENTAMICIN 4 SENSITIVE Sensitive     IMIPENEM 2 SENSITIVE Sensitive     PIP/TAZO 32 SENSITIVE Sensitive     CEFEPIME 8 SENSITIVE Sensitive     * >=100,000 COLONIES/mL PSEUDOMONAS AERUGINOSA  Blood culture (routine x 2)     Status: None (Preliminary result)   Collection Time: 04/29/17 11:23 PM  Result Value Ref Range Status   Specimen Description BLOOD BLOOD RIGHT FOREARM  Final   Special Requests   Final    BOTTLES DRAWN AEROBIC AND ANAEROBIC Blood Culture adequate volume   Culture NO GROWTH 2  DAYS  Final   Report Status PENDING  Incomplete  MRSA PCR Screening     Status: None   Collection Time: 04/30/17  4:28 PM  Result Value Ref Range Status   MRSA by PCR NEGATIVE NEGATIVE Final    Comment:        The GeneXpert MRSA Assay (FDA approved for NASAL specimens only), is one component of a comprehensive MRSA colonization surveillance program. It is not intended to diagnose MRSA infection nor to guide or monitor treatment for MRSA infections.     Radiology Reports Ct Abdomen Pelvis W Contrast  Result Date: 04/30/2017 CLINICAL DATA:  Chronic severe groin pain, with infection.  Dysuria. EXAM: CT ABDOMEN AND PELVIS WITH CONTRAST TECHNIQUE: Multidetector CT imaging of the abdomen and pelvis was performed using the standard protocol following bolus administration of intravenous contrast. CONTRAST:  169m ISOVUE-300 IOPAMIDOL (ISOVUE-300) INJECTION 61% COMPARISON:  CT of the abdomen and pelvis performed 12/23/2016, and renal ultrasound performed 02/08/2017 FINDINGS: Lower chest: Right basilar airspace opacity likely reflects atelectasis. Scattered coronary artery calcifications are seen. Hepatobiliary: The liver is unremarkable in appearance. The gallbladder is unremarkable in appearance. The common bile duct remains normal in caliber. Pancreas:  The pancreas is within normal limits. Spleen: An apparent peripheral infarct is noted within the spleen. The spleen is enlarged, measuring 17.9 cm in length. Adrenals/Urinary Tract: The adrenal glands are unremarkable in appearance. Nonspecific perinephric stranding is noted bilaterally. A right renal cyst is noted. There is mild wall enhancement along the right renal pelvis and proximal ureter, raising concern for right-sided pyelonephritis and ureteritis. No significant hydronephrosis is seen. No renal or ureteral stones are identified. Stomach/Bowel: The stomach is unremarkable in appearance. The small  bowel is within normal limits. The appendix is  normal in caliber, without evidence of appendicitis. Scattered diverticulosis is noted along the descending and sigmoid colon, without evidence of diverticulitis. Vascular/Lymphatic: Scattered calcification is seen along the abdominal aorta and its branches. The abdominal aorta is otherwise grossly unremarkable. The inferior vena cava is grossly unremarkable. No retroperitoneal lymphadenopathy is seen. No pelvic sidewall lymphadenopathy is identified. Reproductive: There is diffuse irregular wall thickening about the bladder, with multiple bladder diverticula noted. The appearance raises concern for cystitis or chronic inflammation. The prostate remains normal in size. Other: A nonspecific 1.7 cm subcutaneous soft tissue nodule is noted at the left inguinal region. Underlying nodes are normal in size. Musculoskeletal: Diffuse heterogeneous sclerosis of the visualized osseous structures reflects the patient's myeloproliferative disorder. The visualized musculature is unremarkable in appearance. IMPRESSION: 1. Mild wall enhancement along the right renal pelvis and proximal right ureter, raising concern for right-sided pyelonephritis and ureteritis. No significant hydronephrosis seen. 2. Diffuse irregular wall thickening about the bladder, with multiple bladder diverticula. The appearance raises concern for cystitis or chronic inflammation. 3. 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 4. Nonspecific 1.7 cm subcutaneous soft tissue nodule at the left inguinal region. Underlying lymph nodes are normal in size. 5. Right basilar airspace opacity likely reflects atelectasis. 6. Scattered coronary artery calcifications noted. 7. Scattered diverticulosis along the descending and sigmoid colon, without evidence of diverticulitis. 8. Diffuse heterogeneous sclerosis of the visualized osseous structures reflects the patient's myeloproliferative disorder. Aortic Atherosclerosis (ICD10-I70.0).  Electronically Signed   By: Garald Balding M.D.   On: 04/30/2017 02:08    Lab Data:  CBC: Recent Labs  Lab 04/29/17 1955 05/02/17 0622  WBC 13.4* 14.1*  HGB 10.1* 8.9*  HCT 33.3* 30.0*  MCV 83.5 84.7  PLT 330 956   Basic Metabolic Panel: Recent Labs  Lab 04/29/17 1955 05/02/17 0622 05/03/17 0507  NA 139 139 141  K 4.0 4.4 4.4  CL 108 108 110  CO2 '25 25 23  '$ GLUCOSE 102* 107* 85  BUN 34* 36* 34*  CREATININE 1.24 1.32* 1.38*  CALCIUM 9.6 9.1 9.3   GFR: Estimated Creatinine Clearance: 54.6 mL/min (A) (by C-G formula based on SCr of 1.38 mg/dL (H)). Liver Function Tests: Recent Labs  Lab 04/29/17 1955  AST 26  ALT 15*  ALKPHOS 120  BILITOT 0.4  PROT 5.6*  ALBUMIN 2.6*   Recent Labs  Lab 04/29/17 1955  LIPASE 24   No results for input(s): AMMONIA in the last 168 hours. Coagulation Profile: No results for input(s): INR, PROTIME in the last 168 hours. Cardiac Enzymes: No results for input(s): CKTOTAL, CKMB, CKMBINDEX, TROPONINI in the last 168 hours. BNP (last 3 results) No results for input(s): PROBNP in the last 8760 hours. HbA1C: No results for input(s): HGBA1C in the last 72 hours. CBG: No results for input(s): GLUCAP in the last 168 hours. Lipid Profile: No results for input(s): CHOL, HDL, LDLCALC, TRIG, CHOLHDL, LDLDIRECT in the last 72 hours. Thyroid Function Tests: No results for input(s): TSH, T4TOTAL, FREET4, T3FREE, THYROIDAB in the last 72 hours. Anemia Panel: No results for input(s): VITAMINB12, FOLATE, FERRITIN, TIBC, IRON, RETICCTPCT in the last 72 hours. Urine analysis:    Component Value Date/Time   COLORURINE YELLOW 04/29/2017 1915   APPEARANCEUR CLOUDY (A) 04/29/2017 1915   LABSPEC 1.012 04/29/2017 1915   PHURINE 6.0 04/29/2017 1915   GLUCOSEU NEGATIVE 04/29/2017 1915   GLUCOSEU NEGATIVE 05/02/2015 1008   HGBUR SMALL (  A) 04/29/2017 1915   BILIRUBINUR NEGATIVE 04/29/2017 1915   KETONESUR NEGATIVE 04/29/2017 1915   PROTEINUR >=300  (A) 04/29/2017 1915   UROBILINOGEN 0.2 05/02/2015 1008   NITRITE POSITIVE (A) 04/29/2017 1915   LEUKOCYTESUR LARGE (A) 04/29/2017 1915     Sinai Illingworth M.D. [facility] 05/03/2017, 11:53 AM  Pager: [phone] Between 7am to 7pm - call Pager - [phone]  After 7pm go to [email or web address] - password [email or web address]  Call night coverage person covering after 7pm

## 2017-05-03 NOTE — Care Management Note (Addendum)
Case Management Note  Patient Details  Name: Juan [name] MRN: [id number] Date of Birth: 12/19/1940  Subjective/Objective:                    Action/Plan:  Discussed discharge planning with patient at bedside. Patient from home with son. Has walker and cane at home already.   Patient has home oxygen already through [name].  He states he has home health already but cannot remember the name of the agency. Explained if he discharges with PICC and IV ABX he and son will will trained how to administer the ABX , the nurse will not be present to give every dose.   Looking back through notes patient was set up with [name] in September 2018 but then refused and refused SNF due to co pays.   Spoke with [name] with [name] , patient refused to allow [name] in to start care. Called number listed for daughter , not in service.     Await PT recommendations.   Expected Discharge Date:                  Expected Discharge Plan:     In-House Referral:     Discharge planning Services  CM Consult  Post Acute Care Choice:    Choice offered to:  Patient  DME Arranged:    DME Agency:     HH Arranged:    [name] Agency:     Status of Service:  In process, will continue to follow  If discussed at Long Length of Stay Meetings, dates discussed:    Additional Comments:  Marilu Favre, RN 05/03/2017, 1:08 PM

## 2017-05-04 ENCOUNTER — Inpatient Hospital Stay (HOSPITAL_COMMUNITY): Payer: Medicare Other

## 2017-05-04 DIAGNOSIS — M25571 Pain in right ankle and joints of right foot: Secondary | ICD-10-CM

## 2017-05-04 DIAGNOSIS — M25471 Effusion, right ankle: Secondary | ICD-10-CM

## 2017-05-04 LAB — CBC
HEMATOCRIT: 31.3 % — AB (ref 39.0–52.0)
Hemoglobin: 9.3 g/dL — ABNORMAL LOW (ref 13.0–17.0)
MCH: 24.9 pg — ABNORMAL LOW (ref 26.0–34.0)
MCHC: 29.7 g/dL — AB (ref 30.0–36.0)
MCV: 83.9 fL (ref 78.0–100.0)
Platelets: 402 10*3/uL — ABNORMAL HIGH (ref 150–400)
RBC: 3.73 MIL/uL — ABNORMAL LOW (ref 4.22–5.81)
RDW: 18.4 % — AB (ref 11.5–15.5)
WBC: 19.6 10*3/uL — AB (ref 4.0–10.5)

## 2017-05-04 LAB — BASIC METABOLIC PANEL
Anion gap: 7 (ref 5–15)
BUN: 41 mg/dL — AB (ref 6–20)
CHLORIDE: 107 mmol/L (ref 101–111)
CO2: 24 mmol/L (ref 22–32)
CREATININE: 1.68 mg/dL — AB (ref 0.61–1.24)
Calcium: 9.1 mg/dL (ref 8.9–10.3)
GFR calc Af Amer: 44 mL/min — ABNORMAL LOW (ref >60)
GFR calc non Af Amer: 38 mL/min — ABNORMAL LOW (ref >60)
GLUCOSE: 149 mg/dL — AB (ref 65–99)
POTASSIUM: 4.9 mmol/L (ref 3.5–5.1)
SODIUM: 138 mmol/L (ref 135–145)

## 2017-05-04 LAB — CULTURE, BLOOD (ROUTINE X 2)
CULTURE: NO GROWTH
Special Requests: ADEQUATE

## 2017-05-04 MED ORDER — B COMPLEX-C PO TABS
1.0000 | ORAL_TABLET | Freq: Every day | ORAL | Status: DC
  Administered 2017-05-04 – 2017-05-08 (×5): 1 via ORAL
  Filled 2017-05-04 (×5): qty 1

## 2017-05-04 NOTE — Care Management Important Message (Signed)
Important Message  Patient Details  Name: Juan [name] MRN: [id number] Date of Birth: December 22, 1940   Medicare Important Message Given:  Yes    [name] [name] 05/04/2017, 12:07 PM

## 2017-05-04 NOTE — Progress Notes (Signed)
Nutrition Brief Note  Patient identified on the Malnutrition Screening Tool (MST) Report  Wt Readings from Last 15 Encounters:  04/29/17 184 lb (83.5 kg)  04/12/17 174 lb (78.9 kg)  03/30/17 174 lb (78.9 kg)  03/08/17 174 lb (78.9 kg)  03/01/17 184 lb (83.5 kg)  02/09/17 176 lb 6.4 oz (80 kg)  01/25/17 184 lb (83.5 kg)  01/10/17 184 lb (83.5 kg)  01/09/17 184 lb (83.5 kg)  01/06/17 186 lb 9.6 oz (84.6 kg)  12/29/16 186 lb (84.4 kg)  12/24/16 192 lb 7.4 oz (87.3 kg)  11/16/16 186 lb 6.4 oz (84.6 kg)  11/03/16 205 lb 1.6 oz (93 kg)  10/30/16 194 lb (88 kg)   Emelio Schneller is a 76 y.o. male with medical history significant of chronic debility, myelo proliferative disorder, CHF, severe pulm HTN on 2L O2 at baseline, CKD stage 3.  Patient presents to the ED with c/o worsening dysuria.  Patient was diagnosed with E.Coli UTI on 11/26.  Treated with course of Keflex which it was sensitive to.  Initially symptoms improved some but not entirely, but after finishing course returned and got worse.  He reports dysuria, urinary frequency and urgency, lower abdominal pain.  He lives at home by himself, using a walker.  Body mass index is 23 kg/m. Patient meets criteria for normal weight range based on current BMI.   Current diet order is Heart Healthy, patient is consuming approximately 75-100% of meals at this time. Labs and medications reviewed.   No nutrition interventions warranted at this time. If nutrition issues arise, please consult RD.   Jordyne Poehlman A. Jimmye Norman, RD, LDN, CDE Pager: [phone] After hours Pager: [phone]

## 2017-05-04 NOTE — Care Management Note (Signed)
Case Management Note  Patient Details  Name: Bernell Sigal MRN: [id number] Date of Birth: 08/17/1940  Subjective/Objective:                    Action/Plan:  Patient from home with son. Patient had [facility] home [facility] , however end of care date was 04/13/17.   In LOS Dr Reynaldo Minium wanting patient to be [place].   Discussed home health with patient. Patient declined . Discussed possibility of him discharging to home with IV antibiotics, patient stated " I don't need that, they are going to give me pills for home". PT recommending HHPT , again patient declined stating " I don't need that" .  Will continue to follow.  Expected Discharge Date:                  Expected Discharge Plan:     In-House Referral:     Discharge planning Services  CM Consult  Post Acute Care Choice:    Choice offered to:  Patient  DME Arranged:    DME Agency:     HH Arranged:    [place] Agency:     Status of Service:  In process, will continue to follow  If discussed at Long Length of Stay Meetings, dates discussed:    Additional Comments:  Marilu Favre, RN 05/04/2017, 3:37 PM

## 2017-05-04 NOTE — Evaluation (Signed)
Physical Therapy Evaluation Patient Details Name: Juan [name] MRN: [id number] DOB: 1941/03/16 Today's Date: 05/04/2017   History of Present Illness  Juan [name] is a 76 y.o. male with medical history significant of chronic debility, myelo proliferative disorder, CHF, severe pulm HTN on 2L O2 at baseline, CKD stage 3.  Patient presents to the ED with c/o worsening dysuria.  Patient was diagnosed with E.Coli UTI on 11/26.  Treated with course of Keflex which it was sensitive to.  Initially symptoms improved some but not entirely, but after finishing course returned and got worse.  He reports dysuria, urinary frequency and urgency, lower abdominal pain.  He lives at home with son, using a walker.  Clinical Impression  Pt admitted with above diagnosis. Pt currently with functional limitations due to the deficits listed below (see PT Problem List). Pt sitting in chair upon PT arrival. Mod A needed to rise from recliner. Ambulated 26' with RW and min A, distance limited by back pain and SOB, though O2 sats remained 94% on 2L O2. Recommend HHPT though see in chart that pt has refused it in the past. Hope he will consider this time.  Pt will benefit from skilled PT to increase their independence and safety with mobility to allow discharge to the venue listed below.       Follow Up Recommendations Home health PT;Supervision for mobility/OOB    Equipment Recommendations  None recommended by PT    Recommendations for Other Services       Precautions / Restrictions Precautions Precautions: Fall Restrictions Weight Bearing Restrictions: No      Mobility  Bed Mobility               General bed mobility comments: pt received in chair  Transfers Overall transfer level: Needs assistance Equipment used: Rolling walker (2 wheeled) Transfers: Sit to/from Stand Sit to Stand: Mod assist         General transfer comment: pt intiates fwd wt shift at beginning of transfer but once he puts some wt  through his LE's he leans his trunk back shifting wt back into the buttocks and can't get up. Mod A for maintaining fwd wt shift and power up. Practiced this again and was able to rise with min A. He reports that he has a hospital bed and generally stays there so he can elevate it to get up.   Ambulation/Gait Ambulation/Gait assistance: Min assist Ambulation Distance (Feet): 75 Feet Assistive device: Rolling walker (2 wheeled) Gait Pattern/deviations: Step-through pattern;Trunk flexed Gait velocity: decreased Gait velocity interpretation: <1.8 ft/sec, indicative of risk for recurrent falls General Gait Details: pt with back discomfort with sit to stand and ambulation, limits distance. slow but steady gait except with turning, needed a hand for extra stability  Stairs            Wheelchair Mobility    Modified Rankin (Stroke Patients Only)       Balance Overall balance assessment: Needs assistance Sitting-balance support: No upper extremity supported Sitting balance-Leahy Scale: Fair     Standing balance support: Bilateral upper extremity supported Standing balance-Leahy Scale: Poor Standing balance comment: reliant on UE support                             Pertinent Vitals/Pain Pain Assessment: Faces Faces Pain Scale: Hurts even more Pain Location: low back Pain Descriptors / Indicators: Aching Pain Intervention(s): Limited activity within patient's tolerance;Monitored during session  Home Living Family/patient expects to be discharged to:: Private residence Living Arrangements: Children Available Help at Discharge: Family;Available 24 hours/day Type of Home: Apartment Home Access: Level entry     Home Layout: One level Home Equipment: Walker - 2 wheels;Cane - single point;Shower seat;Bedside commode;Wheelchair - manual;Hospital bed Additional Comments: pt lives with his son    Prior Function Level of Independence: Needs assistance   Gait /  Transfers Assistance Needed: ambulates with RW mostly, cane for very short distances within home, and w/c for outside of home  ADL's / Homemaking Assistance Needed: son gets groceries and gets pt set up from bath and takes to doctors appointment  Comments: pt had wrist surgery some time back and has velcro splint to wear as needed     Hand Dominance   Dominant Hand: Right    Extremity/Trunk Assessment   Upper Extremity Assessment Upper Extremity Assessment: RUE deficits/detail RUE Deficits / Details: gout R thumb, feeling better today     Lower Extremity Assessment Lower Extremity Assessment: Generalized weakness    Cervical / Trunk Assessment Cervical / Trunk Assessment: Kyphotic;Other exceptions Cervical / Trunk Exceptions: scoliosis  Communication   Communication: No difficulties  Cognition Arousal/Alertness: Awake/alert Behavior During Therapy: WFL for tasks assessed/performed Overall Cognitive Status: No family/caregiver present to determine baseline cognitive functioning                                 General Comments: appropriate on eval but fair historian. Can tell me about house but relays information about recent health issues and is hard to follow      General Comments General comments (skin integrity, edema, etc.): O2 sats on 2L 94%, HR 75 bpm    Exercises General Exercises - Lower Extremity Ankle Circles/Pumps: AROM;Both;10 reps;Seated Long Arc Quad: AROM;Both;10 reps;Seated   Assessment/Plan    PT Assessment Patient needs continued PT services  PT Problem List Decreased strength;Decreased activity tolerance;Decreased balance;Decreased mobility;Decreased knowledge of precautions;Pain;Cardiopulmonary status limiting activity       PT Treatment Interventions DME instruction;Gait training;Functional mobility training;Therapeutic activities;Therapeutic exercise;Balance training;Patient/family education    PT Goals (Current goals can be found  in the Care Plan section)  Acute Rehab PT Goals Patient Stated Goal: return home PT Goal Formulation: With patient Time For Goal Achievement: 05/18/17 Potential to Achieve Goals: Good    Frequency Min 3X/week   Barriers to discharge        Co-evaluation               AM-PAC PT "6 Clicks" Daily Activity  Outcome Measure Difficulty turning over in bed (including adjusting bedclothes, sheets and blankets)?: Unable Difficulty moving from lying on back to sitting on the side of the bed? : Unable Difficulty sitting down on and standing up from a chair with arms (e.g., wheelchair, bedside commode, etc,.)?: Unable Help needed moving to and from a bed to chair (including a wheelchair)?: A Lot Help needed walking in hospital room?: A Little Help needed climbing 3-5 steps with a railing? : A Lot 6 Click Score: 10    End of Session Equipment Utilized During Treatment: Gait belt;Oxygen Activity Tolerance: Patient tolerated treatment well Patient left: in chair;with call Busch/phone within reach Nurse Communication: Mobility status PT Visit Diagnosis: Unsteadiness on feet (R26.81);Muscle weakness (generalized) (M62.81);Difficulty in walking, not elsewhere classified (R26.2)    Time: 7494-4967 PT Time Calculation (min) (ACUTE ONLY): 27 min   Charges:   PT  Evaluation $PT Eval Moderate Complexity: 1 Mod PT Treatments $Gait Training: 8-22 mins   PT G Codes:        Leighton Roach, PT  Acute Rehab Services  [place] 05/04/2017, 10:16 AM

## 2017-05-04 NOTE — Progress Notes (Signed)
[facility] Coordinator will follow Mr. Mcguire for home IV ABX as ordered at DC.  [facility] will partner with pts [place] agency of choice.   If patient discharges after hours, please call [phone].   Larry Sierras 05/04/2017, 11:00 AM

## 2017-05-04 NOTE — Progress Notes (Signed)
PROGRESS NOTE    Juan [name]  ZCH:[id number] DOB: 06-22-1940 DOA: 04/29/2017 PCP: Colon Branch, MD   Brief Narrative:  Juan [name] a 76 y.o.malewith medical history significant ofchronic debility, myelo proliferative disorder, CHF, severe pulm HTN on 2L O2 at baseline, CKD stage 3 and other comorbids. Patient presents to the ED with c/o worsening dysuria. Patient was diagnosed with E.Coli UTI on 11/26. Treated with course of Keflex which it was sensitive to. Initially symptoms improved some but not entirely, but after finishing course returned and got worse. He reports dysuria, urinary frequency and urgency, lower abdominal pain. Admitted for Pseudomonas UTI and Right sided Pyelonephritis.   Assessment & Plan:   Principal Problem:   Acute lower UTI Active Problems:   Essential hypertension   Chronic diastolic CHF (congestive heart failure) (HCC)   CKD (chronic kidney disease) stage 3, GFR 30-59 ml/min (HCC)   Pulmonary hypertension severe   Pyelonephritis   Acute Pseudomonas lower UTI, right-sided pyelonephritis -CT abdomen pelvis shows right-sided pyelonephritis, urethritis, no significant hydronephrosis, concern for cystitis or chronic inflammation.  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  -DC IV Rocephin, changed to IV Zosyn per sensitivities.  May benefit from changing antibiotic to carbapenem for easier outpatient dosing.   -Patient states he lives at home with his son.  Will need PICC line.  Will obtain PT evaluation if he needs skilled nursing facility versus home health.  Acute Gout Right Hand, improved -Patient reports that he woke up yesterday morning with significant pain in his right hand, feels miserable.  On examination does not appears to have any fracture, range of movement   -CRP was 3.2 and ESR was 69 -Urice Acid was 8.7 -C/w olchicine, allopurinol, prednisone.   -1 dose of Toradol 15 mg IV x1 given yesterday   Essential Hypertension -Currently stable, continue Hydralazine 25 mg po TID  Chronic diastolic CHF (congestive heart failure) (HCC) -Negative balance of 2 L, compensated, stable.   -Continue to hold Lasix until patient eating good.     AKI on CKD (chronic kidney disease) stage 3, GFR 30-59 ml/min (HCC) -Creatinine slightly higher than baseline at 1.68, baseline creatinine 1.2-1.3 -Continue to hold Lasix -Repeat CMP in AM   Pulmonary Hypertension, severe -Continue O2 via nasal cannula  Debility, Myeloproliferative disease -PT recommending Home Health   Right Leg Pain and Swelling -Patient states he hit his leg -Check Right Tibia/Fibula and Ankle X-Ray   Leukocytosis -Patient's WBC 14.1 -> 19.6 -?Related to Prednisone 60 mg po Daily -C/w Current Abx -Continue to Monitor for S/Sx of Infection -Repeat CBC in AM   DVT prophylaxis: Enoxaparin 40 mg sq q24h Code Status: FULL CODE Family Communication: No family present at bedside Disposition Plan: Home with Home Health if patient is agreeable   Consultants:   None   Procedures:  None   Antimicrobials:  Anti-infectives (From admission, onward)   Start     Dose/Rate Route Frequency Ordered Stop   05/02/17 1300  piperacillin-tazobactam (ZOSYN) IVPB 3.375 g     3.375 g 12.5 mL/hr over 240 Minutes Intravenous Every 8 hours 05/02/17 1211     04/30/17 2200  cefTRIAXone (ROCEPHIN) 1 g in dextrose 5 % 50 mL IVPB  Status:  Discontinued     1 g 100 mL/hr over 30 Minutes Intravenous Every 24 hours 04/30/17  3149 04/30/17 0329   04/30/17 2200  cefTRIAXone (ROCEPHIN) 1 g in dextrose 5 % 50 mL IVPB  Status:  Discontinued     1 g 100 mL/hr over 30 Minutes Intravenous Every 24 hours 04/30/17 0330 05/02/17 1211   04/29/17 2230  cefTRIAXone (ROCEPHIN) 1 g in dextrose 5 % 50 mL IVPB     1  g 100 mL/hr over 30 Minutes Intravenous  Once 04/29/17 2221 04/30/17 0346     Subjective: Seen and examined and stated Right hand improved. States Right leg was swollen after banging it against the table. States he has some lower abdominal discomfort. No CP or SOB.   Objective: Vitals:   05/03/17 0441 05/03/17 1417 05/03/17 2229 05/04/17 0524  BP: (!) 141/64 129/69 140/68 (!) 141/66  Pulse: 61 77 (!) 57 70  Resp: '17 18 20 18  '$ Temp: (!) 97.5 F (36.4 C) 97.9 F (36.6 C) 98.2 F (36.8 C) 97.7 F (36.5 C)  TempSrc: Oral Oral Oral   SpO2: 98% 97% 95% 96%  Weight:      Height:        Intake/Output Summary (Last 24 hours) at 05/04/2017 0848 Last data filed at 05/04/2017 7026 Gross per 24 hour  Intake 1620 ml  Output 450 ml  Net 1170 ml   Filed Weights   04/29/17 1928  Weight: 83.5 kg (184 lb)   Examination: Physical Exam:  Constitutional: WN/WD AAM in NAD and appears calm Eyes: Lids and conjunctivae normal, sclerae anicteric  ENMT: External Ears, Nose appear normal. Grossly normal hearing. Mucous membranes are moist.  Neck: Appears normal, supple, no cervical masses, normal ROM, no appreciable thyromegaly; No JVD Respiratory: Diminished to auscultation bilaterally, no wheezing, rales, rhonchi or crackles. Normal respiratory effort and patient is not tachypenic. No accessory muscle use.  Cardiovascular: RRR, no murmurs / rubs / gallops. S1 and S2 auscultated. Mild LE extremity edema.  Abdomen: Soft, mildly tender, non-distended. No masses palpated. No appreciable hepatosplenomegaly. Bowel sounds positive x4.  GU: Deferred. Catheter in place with greyish colored urine.  Musculoskeletal: No clubbing / cyanosis of digits/nails. No joint deformity upper and lower extremities.  Skin: No rashes, lesions, ulcers on a limited skin eval. No induration; Warm and dry.  Neurologic: CN 2-12 grossly intact with no focal deficits. Sensation intact in all 4 Extremities. Romberg sign and  cerebellar reflexes not assessed.  Psychiatric: Normal judgment and insight. Alert and oriented x 3. Normal mood and appropriate affect.   Data Reviewed: I have personally reviewed following labs and imaging studies  CBC: Recent Labs  Lab 04/29/17 1955 05/02/17 0622 05/04/17 0501  WBC 13.4* 14.1* 19.6*  HGB 10.1* 8.9* 9.3*  HCT 33.3* 30.0* 31.3*  MCV 83.5 84.7 83.9  PLT 330 344 378*   Basic Metabolic Panel: Recent Labs  Lab 04/29/17 1955 05/02/17 0622 05/03/17 0507 05/04/17 0501  NA 139 139 141 138  K 4.0 4.4 4.4 4.9  CL 108 108 110 107  CO2 '25 25 23 24  '$ GLUCOSE 102* 107* 85 149*  BUN 34* 36* 34* 41*  CREATININE 1.24 1.32* 1.38* 1.68*  CALCIUM 9.6 9.1 9.3 9.1   GFR: Estimated Creatinine Clearance: 44.9 mL/min (A) (by C-G formula based on SCr of 1.68 mg/dL (H)). Liver Function Tests: Recent Labs  Lab 04/29/17 1955  AST 26  ALT 15*  ALKPHOS 120  BILITOT 0.4  PROT 5.6*  ALBUMIN 2.6*   Recent Labs  Lab 04/29/17 1955  LIPASE 24   No results  for input(s): AMMONIA in the last 168 hours. Coagulation Profile: No results for input(s): INR, PROTIME in the last 168 hours. Cardiac Enzymes: No results for input(s): CKTOTAL, CKMB, CKMBINDEX, TROPONINI in the last 168 hours. BNP (last 3 results) No results for input(s): PROBNP in the last 8760 hours. HbA1C: No results for input(s): HGBA1C in the last 72 hours. CBG: No results for input(s): GLUCAP in the last 168 hours. Lipid Profile: No results for input(s): CHOL, HDL, LDLCALC, TRIG, CHOLHDL, LDLDIRECT in the last 72 hours. Thyroid Function Tests: No results for input(s): TSH, T4TOTAL, FREET4, T3FREE, THYROIDAB in the last 72 hours. Anemia Panel: No results for input(s): VITAMINB12, FOLATE, FERRITIN, TIBC, IRON, RETICCTPCT in the last 72 hours. Sepsis Labs: Recent Labs  Lab 04/29/17 2255  LATICACIDVEN 0.93    Recent Results (from the past 240 hour(s))  Blood culture (routine x 2)     Status: None  (Preliminary result)   Collection Time: 04/29/17 10:43 PM  Result Value Ref Range Status   Specimen Description BLOOD RIGHT HAND  Final   Special Requests IN PEDIATRIC BOTTLE Blood Culture adequate volume  Final   Culture NO GROWTH 4 DAYS  Final   Report Status PENDING  Incomplete  Urine Culture     Status: Abnormal   Collection Time: 04/29/17 10:43 PM  Result Value Ref Range Status   Specimen Description URINE, RANDOM  Final   Special Requests NONE  Final   Culture >=100,000 COLONIES/mL PSEUDOMONAS AERUGINOSA (A)  Final   Report Status 05/02/2017 FINAL  Final   Organism ID, Bacteria PSEUDOMONAS AERUGINOSA (A)  Final      Susceptibility   Pseudomonas aeruginosa - MIC*    CEFTAZIDIME 4 SENSITIVE Sensitive     CIPROFLOXACIN >=4 RESISTANT Resistant     GENTAMICIN 4 SENSITIVE Sensitive     IMIPENEM 2 SENSITIVE Sensitive     PIP/TAZO 32 SENSITIVE Sensitive     CEFEPIME 8 SENSITIVE Sensitive     * >=100,000 COLONIES/mL PSEUDOMONAS AERUGINOSA  Blood culture (routine x 2)     Status: None (Preliminary result)   Collection Time: 04/29/17 11:23 PM  Result Value Ref Range Status   Specimen Description BLOOD BLOOD RIGHT FOREARM  Final   Special Requests   Final    BOTTLES DRAWN AEROBIC AND ANAEROBIC Blood Culture adequate volume   Culture NO GROWTH 3 DAYS  Final   Report Status PENDING  Incomplete  MRSA PCR Screening     Status: None   Collection Time: 04/30/17  4:28 PM  Result Value Ref Range Status   MRSA by PCR NEGATIVE NEGATIVE Final    Comment:        The GeneXpert MRSA Assay (FDA approved for NASAL specimens only), is one component of a comprehensive MRSA colonization surveillance program. It is not intended to diagnose MRSA infection nor to guide or monitor treatment for MRSA infections.     Radiology Studies: No results found.  Scheduled Meds: . allopurinol  150 mg Oral Daily  . aspirin  325 mg Oral Daily  . colchicine  0.6 mg Oral Daily  . diclofenac sodium  4 g  Topical QID  . enoxaparin (LOVENOX) injection  40 mg Subcutaneous Q24H  . feeding supplement (GLUCERNA SHAKE)  118.5 mL Oral Daily  . ferrous gluconate  324 mg Oral Daily  . fluticasone  2 spray Each Nare Daily  . hydrALAZINE  25 mg Oral TID  . hydrocortisone cream   Topical BID  . pantoprazole  40  mg Oral Q0600  . predniSONE  60 mg Oral Q breakfast   Continuous Infusions: . piperacillin-tazobactam (ZOSYN)  IV 3.375 g (05/04/17 0526)    LOS: 4 days   Kerney Elbe, DO [facility] Pager [phone]  If 7PM-7AM, please contact night-coverage [email or web address] Password [facility] 05/04/2017, 8:48 AM

## 2017-05-05 LAB — COMPREHENSIVE METABOLIC PANEL
ALK PHOS: 85 U/L (ref 38–126)
ALT: 11 U/L — AB (ref 17–63)
AST: 21 U/L (ref 15–41)
Albumin: 2.2 g/dL — ABNORMAL LOW (ref 3.5–5.0)
Anion gap: 9 (ref 5–15)
BILIRUBIN TOTAL: 0.4 mg/dL (ref 0.3–1.2)
BUN: 49 mg/dL — AB (ref 6–20)
CALCIUM: 9 mg/dL (ref 8.9–10.3)
CO2: 23 mmol/L (ref 22–32)
CREATININE: 1.82 mg/dL — AB (ref 0.61–1.24)
Chloride: 106 mmol/L (ref 101–111)
GFR, EST AFRICAN AMERICAN: 40 mL/min — AB (ref >60)
GFR, EST NON AFRICAN AMERICAN: 35 mL/min — AB (ref >60)
Glucose, Bld: 122 mg/dL — ABNORMAL HIGH (ref 65–99)
Potassium: 4.4 mmol/L (ref 3.5–5.1)
Sodium: 138 mmol/L (ref 135–145)
Total Protein: 5 g/dL — ABNORMAL LOW (ref 6.5–8.1)

## 2017-05-05 LAB — CBC WITH DIFFERENTIAL/PLATELET
BASOS PCT: 1 %
Band Neutrophils: 19 %
Basophils Absolute: 0.2 10*3/uL — ABNORMAL HIGH (ref 0.0–0.1)
Blasts: 0 %
Eosinophils Absolute: 0.2 10*3/uL (ref 0.0–0.7)
Eosinophils Relative: 1 %
HEMATOCRIT: 30.6 % — AB (ref 39.0–52.0)
HEMOGLOBIN: 9.2 g/dL — AB (ref 13.0–17.0)
LYMPHS PCT: 8 %
Lymphs Abs: 1.7 10*3/uL (ref 0.7–4.0)
MCH: 25.1 pg — AB (ref 26.0–34.0)
MCHC: 30.1 g/dL (ref 30.0–36.0)
MCV: 83.6 fL (ref 78.0–100.0)
MONO ABS: 0.7 10*3/uL (ref 0.1–1.0)
Metamyelocytes Relative: 14 %
Monocytes Relative: 3 %
Myelocytes: 8 %
NEUTROS PCT: 46 %
NRBC: 0 /100{WBCs}
Neutro Abs: 18.9 10*3/uL — ABNORMAL HIGH (ref 1.7–7.7)
OTHER: 0 %
PROMYELOCYTES ABS: 0 %
Platelets: 443 10*3/uL — ABNORMAL HIGH (ref 150–400)
RBC: 3.66 MIL/uL — AB (ref 4.22–5.81)
RDW: 18.4 % — ABNORMAL HIGH (ref 11.5–15.5)
WBC: 21.7 10*3/uL — AB (ref 4.0–10.5)

## 2017-05-05 LAB — CULTURE, BLOOD (ROUTINE X 2)
CULTURE: NO GROWTH
SPECIAL REQUESTS: ADEQUATE

## 2017-05-05 LAB — PHOSPHORUS: PHOSPHORUS: 3.4 mg/dL (ref 2.5–4.6)

## 2017-05-05 LAB — MAGNESIUM: MAGNESIUM: 2.3 mg/dL (ref 1.7–2.4)

## 2017-05-05 MED ORDER — DEXTROSE 5 % IV SOLN
2.0000 g | INTRAVENOUS | Status: DC
  Administered 2017-05-05 – 2017-05-07 (×3): 2 g via INTRAVENOUS
  Filled 2017-05-05 (×4): qty 2

## 2017-05-05 NOTE — Social Work (Signed)
CSW informed by RN that pt amenable to SNF to finish antibiotics after pt spoke with MD.    CSW will follow up for assessment and support with discharge to SNF when medically appropriate.  Alexander Mt, [place] Work [phone]

## 2017-05-05 NOTE — Clinical Social Work Note (Addendum)
Clinical Social Work Assessment  Patient Details  Name: Juan [name] MRN: [id number] Date of Birth: January 04, 1941  Date of referral:  05/05/17               Reason for consult:  Facility Placement                Permission sought to share information with:  Facility Sport and exercise psychologist, Family Supports Permission granted to share information::  Yes, Verbal Permission Granted  Name::        Agency::  SNF  Relationship::    son  Contact Information:   "does not have phone"  Housing/Transportation Living arrangements for the past 2 months:  [place] of Information:  Patient, Adult Children Patient Interpreter Needed:  None Criminal Activity/Legal Involvement Pertinent to Current Situation/Hospitalization:  No - Comment as needed Significant Relationships:  None Lives with:  Adult Children Do you feel safe going back to the place where you live?  Yes Need for family participation in patient care:  Yes (Comment)  Care giving concerns:  Pt lives with adult son and is worried that he won't get rid of "fungus" if he goes home. Pt son feels unable to provide antibiotic care. Pt has refused SNF and home health in the past.    Facilities manager / plan:  CSW met with pt and pt son at bedside. Pt states that he is trusting MD recommendation to go to SNF to complete antibiotics. Pt states that he doesn't like it but that he needs to get rid of his "fungus" and that "peeing is painful and I want it to get better." Pt states that he has been to multiple SNFs in the past with varying experiences. Would like SNF close to apt, son does not have a car and utilizes the bus to get around, pt would like him to be able to come to SNF to visit. Pt son minimally participated in assessment, only stating that he would like to be here tomorrow when we discuss placement options. CSW will continue with SNF placement process and follow up with pt and pt son regarding offers.  Employment status:   Retired Forensic scientist:  Medicare PT Recommendations:  Home with [place] / Referral to community resources:  [place]  Patient/Family's Response to care:  Pt and pt son amenable to SNF recommendation.  Patient/Family's Understanding of and Emotional Response to Diagnosis, Current Treatment, and Prognosis:  Pt is very chatty and states that he understands his current diagnosis, treatment, and the prognosis. Pt expresses understanding that antibiotics are important to clear infection and that he is okay with getting help at a SNF. Pt son also briefly stated understanding, but otherwise was quiet during assessment, only nodding at certain points in agreement.   Emotional Assessment Appearance:  Appears stated age Attitude/Demeanor/Rapport:  Apprehensive Affect (typically observed):  Apprehensive, Accepting Orientation:  Oriented to Self, Oriented to  Time, Oriented to Situation, Oriented to Place Alcohol / Substance use:  Tobacco Use(Former Smoker, quit 1968) Psych involvement (Current and /or in the community):  No (Comment)  Discharge Needs  Concerns to be addressed:  Patient refuses services, Discharge Planning Concerns, Care Coordination Readmission within the last 30 days:    Current discharge risk:  None Barriers to Discharge:  Ship broker, Continued Medical Work up   [employer], [place] 05/05/2017, 1:32 PM

## 2017-05-05 NOTE — NC FL2 (Signed)
[place] LEVEL OF CARE SCREENING TOOL     IDENTIFICATION  Patient Name: Juan [name] Birthdate: December 10, 1940 Sex: male Admission Date (Current Location): 04/29/2017  [facility] and [place] Number:  Herbalist and Address:  [facility] [place], [place] [address], [place], [place] [address]      Provider Number: [id number]  Attending Physician Name and Address:  Kerney Elbe, DO  Relative Name and Phone Number:       Current Level of Care: Hospital Recommended Level of Care: [place] Prior Approval Number:    Date Approved/Denied: 04/10/14 PASRR Number: [id number]    Discharge Plan: SNF    Current Diagnoses: Patient Active Problem List   Diagnosis Date Noted  . Pyelonephritis 04/30/2017  . Iron deficiency anemia due to chronic blood loss 03/31/2017  . 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 Diffuse pain 01/26/2017  . Infection due to urethral catheter ([place])   . Urinary retention 01/24/2017  . Right hip pain 12/24/2016  . 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 Muscle spasm 11/16/2016  . Neck pain 11/14/2016  . Elevated troponin 11/14/2016  . Arthritis   . Recurrent pneumonia 10/31/2016  . Acute extremity pain 10/31/2016  . 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 Pulmonary hypertension severe   . Sprain of left rotator cuff capsule   . Hyperkalemia   . Acute kidney injury superimposed on CKD ([place])   . Fever 10/08/2016  . Left arm pain 10/08/2016  . Paroxysmal SVT (supraventricular tachycardia) ([place]) 04/20/2016  . Type II diabetes mellitus ([place]) 04/20/2016  . Gout 04/20/2016  . Hyperuricemia 04/20/2016  . B12 deficiency 04/20/2016  . Neuropathy due to secondary diabetes ([place]) 04/20/2016  . Hydronephrosis   . Thrombocytosis ([place]) 04/07/2016  . Anemia 04/07/2016  . Pressure injury of skin 04/01/2016  . Pressure ulcer stage II 12/31/2015  . Sepsis ([place]) 12/29/2015  . Acute lower UTI 12/29/2015  . T wave  inversion in EKG 12/29/2015  . PCP NOTES >>>>>>>>>>>>>>>>> 05/02/2015  . Arm pain   . Peripheral edema   . Unable to ambulate   . Thoracic aortic aneurysm ([place]) 04/26/2014  . CKD (chronic kidney disease) stage 3, GFR 30-59 ml/min (HCC) 04/26/2014  . 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 Chest pain 04/15/2014  . Encounter for palliative care 04/06/2014  . Generalized weakness 04/06/2014  . Venous stasis dermatitis of both lower extremities   . Malnutrition of moderate degree ([place]) 03/31/2014  . Morbid obesity due to excess calories ([place]) 03/29/2014  . 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 Poor compliance with advise  05/05/2012  . Annual physical exam 01/14/2012  . Weight loss 01/14/2012  . *Chronic lower extremity edema, stasis dermatitis 06/09/2011  . Symptomatic anemia 09/07/2006  . Myeloproliferative disease ([place]) 09/07/2006  . Obstructive sleep apnea 09/07/2006  . Essential hypertension 09/07/2006  . Coronary atherosclerosis 09/07/2006  . Chronic diastolic CHF (congestive heart failure) ([place]) 09/07/2006  . Allergic rhinitis 09/07/2006    Orientation RESPIRATION BLADDER Height & Weight     Self, Time, Situation, Place  O2(nasal canula) External catheter Weight: 184 lb (83.5 kg) Height:  6\' 3"  (190.5 cm)  BEHAVIORAL SYMPTOMS/MOOD NEUROLOGICAL BOWEL NUTRITION STATUS      Incontinent Diet  AMBULATORY STATUS COMMUNICATION OF NEEDS Skin   Limited Assist Verbally Normal(Dry; Itching)                       Personal Care Assistance  Level of Assistance  Bathing, Feeding, Dressing Bathing Assistance: Limited assistance Feeding assistance: Independent Dressing Assistance: Limited assistance     Functional Limitations Info  Sight, Hearing, Speech Sight Info: Impaired(R&L Eye) Hearing Info: Adequate Speech Info: Adequate    SPECIAL CARE FACTORS FREQUENCY  PT (By licensed PT), OT (By licensed OT)     PT Frequency: 5x week OT Frequency:  5x week            Contractures Contractures Info: Not present    Additional Factors Info  Code Status, Allergies, Isolation Precautions Code Status Info: Full Code Allergies Info: NKA     Isolation Precautions Info: MRSA     Current Medications (05/05/2017):  This is the current hospital active medication list Current Facility-Administered Medications  Medication Dose Route Frequency Provider Last Rate Last Dose  . acetaminophen (TYLENOL) tablet 650 mg  650 mg Oral Q6H PRN Etta Quill, DO   650 mg at 05/05/17 4008   Or  . acetaminophen (TYLENOL) suppository 650 mg  650 mg Rectal Q6H PRN Etta Quill, DO      . allopurinol (ZYLOPRIM) tablet 150 mg  150 mg Oral Daily Rai, Ripudeep K, MD   150 mg at 05/05/17 1007  . aspirin tablet 325 mg  325 mg Oral Daily Etta Quill, DO   325 mg at 05/05/17 1007  . B-complex with vitamin C tablet 1 tablet  1 tablet Oral Daily Raiford Noble [place], DO   1 tablet at 05/05/17 1008  . ceFEPIme (MAXIPIME) 2 g in dextrose 5 % 50 mL IVPB  2 g Intravenous Q24H Raiford Noble [place], DO 100 mL/hr at 05/05/17 1402 2 g at 05/05/17 1402  . colchicine tablet 0.6 mg  0.6 mg Oral Daily Rai, Ripudeep K, MD   0.6 mg at 05/05/17 1007  . diclofenac sodium (VOLTAREN) 1 % transdermal gel 4 g  4 g Topical QID Jennette Kettle M, DO   4 g at 05/05/17 1402  . enoxaparin (LOVENOX) injection 40 mg  40 mg Subcutaneous Q24H Jennette Kettle M, DO   40 mg at 05/05/17 1402  . feeding supplement (GLUCERNA SHAKE) (GLUCERNA SHAKE) liquid 118.5 mL  118.5 mL Oral Daily Jennette Kettle M, DO   118.5 mL at 05/05/17 1016  . ferrous gluconate (FERGON) tablet 324 mg  324 mg Oral Daily Jennette Kettle M, DO   324 mg at 05/05/17 1009  . fluticasone (FLONASE) 50 MCG/ACT nasal spray 2 spray  2 spray Each Nare Daily Etta Quill, DO   2 spray at 05/05/17 1009  . gabapentin (NEURONTIN) capsule 200 mg  200 mg Oral TID PRN Etta Quill, DO   200 mg at 05/03/17 0017  . hydrALAZINE  (APRESOLINE) tablet 25 mg  25 mg Oral TID Etta Quill, DO   25 mg at 05/05/17 1007  . hydrocerin (EUCERIN) cream 1 application  1 application Topical Daily PRN Etta Quill, DO      . hydrocortisone cream 1 %   Topical BID Etta Quill, DO      . hydrOXYzine (ATARAX/VISTARIL) tablet 10 mg  10 mg Oral Q4H PRN Etta Quill, DO      . ondansetron [facility]) tablet 4 mg  4 mg Oral Q6H PRN Etta Quill, DO   4 mg at 04/30/17 1620   Or  . ondansetron (ZOFRAN) injection 4 mg  4 mg Intravenous Q6H PRN Etta Quill, DO   4 mg at 05/01/17 2343  .  pantoprazole (PROTONIX) EC tablet 40 mg  40 mg Oral Q0600 Rai, Ripudeep K, MD   40 mg at 05/05/17 0528  . polyethylene glycol (MIRALAX / GLYCOLAX) packet 17 g  17 g Oral Daily PRN Etta Quill, DO   17 g at 05/04/17 0940     Discharge Medications: Please see discharge summary for a list of discharge medications.  Relevant Imaging Results:  Relevant Lab Results:   Additional Information SS# [place], [place]

## 2017-05-05 NOTE — Progress Notes (Signed)
Physical Therapy Treatment Patient Details Name: Juan [name] MRN: [id number] DOB: 1941/02/17 Today's Date: 05/05/2017    History of Present Illness Juan [name] is a 76 y.o. male with medical history significant of chronic debility, myelo proliferative disorder, CHF, severe pulm HTN on 2L O2 at baseline, CKD stage 3.  Patient presents to the ED with c/o worsening dysuria.  Patient was diagnosed with E.Coli UTI on 11/26.  Treated with course of Keflex which it was sensitive to.  Initially symptoms improved some but not entirely, but after finishing course returned and got worse.  He reports dysuria, urinary frequency and urgency, lower abdominal pain.  He lives at home with son, using a walker.    PT Comments    Pt is progressing well with gait and mobility.  Seems less dyspneic today compared to previous session, and once up on his feet he is supervision for gait (he needs mod assist to get out of a low recliner chair both given his weakness and his tall stature).  He reports he has the support of his son at home and would benefit from f/u therapy at discharge.  He would also benefit from working with the mobility tech her acutely to increase the number of times he gets up to walk.  RN made aware and to put referral in the computer.   Follow Up Recommendations  Home health PT;Supervision for mobility/OOB     Equipment Recommendations  None recommended by PT    Recommendations for Other Services   NA     Precautions / Restrictions Precautions Precautions: Fall    Mobility  Bed Mobility               General bed mobility comments: Pt was OOB in the recliner chair.   Transfers Overall transfer level: Needs assistance Equipment used: Rolling walker (2 wheeled) Transfers: Sit to/from Stand Sit to Stand: Mod assist         General transfer comment: Mod assist to boost trunk up over feet from low recliner chair (pt is tall in stature).    Ambulation/Gait Ambulation/Gait  assistance: Supervision;Min guard Ambulation Distance (Feet): 80 Feet Assistive device: Rolling walker (2 wheeled) Gait Pattern/deviations: Step-through pattern;Shuffle;Trunk flexed Gait velocity: decreased Gait velocity interpretation: Below normal speed for age/gender General Gait Details: Pt with flexed trunk, which is his baseline and he is unable to straighten up more.  DOE 2/4 on 2 L O2 [place] (what he normally uses at home), but he is able to recover with standing rest break.  O2 sats stable.             Balance Overall balance assessment: Needs assistance Sitting-balance support: Feet supported;No upper extremity supported Sitting balance-Leahy Scale: Fair     Standing balance support: Bilateral upper extremity supported Standing balance-Leahy Scale: Poor                              Cognition Arousal/Alertness: Awake/alert Behavior During Therapy: WFL for tasks assessed/performed Overall Cognitive Status: History of cognitive impairments - at baseline                                 General Comments: Pt is familiar with this pt during previous admissions and he has some baseline cognitive deficits.              Pertinent Vitals/Pain Pain Assessment: No/denies pain  Prior Function            PT Goals (current goals can now be found in the care plan section) Acute Rehab PT Goals Patient Stated Goal: return home Progress towards PT goals: Progressing toward goals    Frequency    Min 3X/week      PT Plan Current plan remains appropriate       AM-PAC PT "6 Clicks" Daily Activity  Outcome Measure  Difficulty turning over in bed (including adjusting bedclothes, sheets and blankets)?: Unable Difficulty moving from lying on back to sitting on the side of the bed? : Unable Difficulty sitting down on and standing up from a chair with arms (e.g., wheelchair, bedside commode, etc,.)?: Unable Help needed moving to and from a  bed to chair (including a wheelchair)?: A Little Help needed walking in hospital room?: A Little Help needed climbing 3-5 steps with a railing? : A Lot 6 Click Score: 11    End of Session Equipment Utilized During Treatment: Gait belt;Oxygen Activity Tolerance: Patient limited by fatigue Patient left: in chair;with call [name]/phone within reach;with family/visitor present Nurse Communication: Mobility status;Other (comment)(asked for mobility tech referral) PT Visit Diagnosis: Unsteadiness on feet (R26.81);Muscle weakness (generalized) (M62.81);Difficulty in walking, not elsewhere classified (R26.2)     Time: 6203-5597 PT Time Calculation (min) (ACUTE ONLY): 23 min  Charges:  $Gait Training: 8-22 mins $Therapeutic Activity: 8-22 mins          Joylene Wescott B. Jaydin Jalomo, PT, DPT [phone]            05/05/2017, 2:16 PM

## 2017-05-05 NOTE — Progress Notes (Signed)
PROGRESS NOTE    Juan [name]  GDJ:[id number] DOB: March 04, 1941 DOA: 04/29/2017 PCP: Colon Branch, MD   Brief Narrative:  Juan [name] a 76 y.o.malewith medical history significant ofchronic debility, myelo proliferative disorder, CHF, severe pulm HTN on 2L O2 at baseline, CKD stage 3 and other comorbids. Patient presents to the ED with c/o worsening dysuria. Patient was diagnosed with E.Coli UTI on 11/26. Treated with course of Keflex which it was sensitive to. Initially symptoms improved some but not entirely, but after finishing course returned and got worse. He reports dysuria, urinary frequency and urgency, lower abdominal pain. Admitted for Pseudomonas UTI and Right sided Pyelonephritis. Hospitalization has been complicated by Gout and an AKI on CKD.  Assessment & Plan:   Principal Problem:   Acute lower UTI Active Problems:   Essential hypertension   Chronic diastolic CHF (congestive heart failure) (HCC)   CKD (chronic kidney disease) stage 3, GFR 30-59 ml/min (HCC)   Pulmonary hypertension severe   Pyelonephritis   Acute Pseudomonas lower UTI, Right-sided pyelonephritis -CT abdomen pelvis shows right-sided pyelonephritis, urethritis, no significant hydronephrosis, concern for cystitis or chronic inflammation.  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  -DC IV Rocephin, changed to IV Zosyn per sensitivities.  May benefit from changing antibiotic to carbapenem for easier outpatient dosing.   -Patient states he lives at home with his son.  Will need PICC line and discussed with Nephro and will need IJ PICC Line.   -PT evaluation if he needs skilled nursing facility versus home health and they recommended Home Health PT; Patient does not want home health and does not feel like son Can administer  Abx -Corporate treasurer for Skilled Nursing for completion of IV Abx  -IR Consulted for IJ PICC Placement   Acute Gout Right Hand, improved -Patient reports that he woke up yesterday morning with significant pain in his right hand, feels miserable.  On examination does not appears to have any fracture, range of movement  -CRP was 3.2 and ESR was 69 -Urice Acid was 8.7 -C/w olchicine, allopurinol, prednisone.   -1 dose of Toradol 15 mg IV x1 given yesterday   Essential Hypertension -Currently stable at 155/73 -Continue Hydralazine 25 mg po TID  Chronic diastolic CHF (congestive heart failure) (HCC) -Negative balance of 1.5 L, compensated, stable.   -Continue to hold Lasix until patient eating good.     AKI on CKD (chronic kidney disease) stage 3, GFR 30-59 ml/min (HCC) -Creatinine slightly higher than baseline at 1.82, baseline creatinine 1.2-1.3 -Continue to hold Lasix -Will check Urine Lytes and get Retroperitoneal U/S -Repeat CMP in AM  -If continues to worsen will discuss with Nephrology and obtain formal consultation   Pulmonary Hypertension, severe -Continue O2 via nasal cannula  Debility, Myeloproliferative disease -PT recommending Home Health   Right Leg Pain and Swelling -Patient states he hit his leg -Checked Right Tibia/Fibula and showed No acute osseous injury of the right tibia and fibula. Diffuse mild osteosclerosis as can be seen with myeloproliferative disease. -Continue to Monitor  Leukocytosis -Patient's WBC 14.1 -> 19.6 -> 21.7 -Suspect that it is Related to Prednisone 60 mg po Daily -C/w Current Abx -Continue to Monitor for S/Sx of Infection -Repeat CBC in AM   DVT prophylaxis: Enoxaparin 40 mg sq q24h Code Status: FULL CODE Family Communication: No family  present at bedside Disposition Plan: Home with [place] if patient is agreeable   Consultants:   None   Procedures:  None   Antimicrobials:  Anti-infectives (From  admission, onward)   Start     Dose/Rate Route Frequency Ordered Stop   05/05/17 1400  ceFEPIme (MAXIPIME) 2 g in dextrose 5 % 50 mL IVPB     2 g 100 mL/hr over 30 Minutes Intravenous Every 24 hours 05/05/17 0850     05/02/17 1300  piperacillin-tazobactam (ZOSYN) IVPB 3.375 g  Status:  Discontinued     3.375 g 12.5 mL/hr over 240 Minutes Intravenous Every 8 hours 05/02/17 1211 05/05/17 0850   04/30/17 2200  cefTRIAXone (ROCEPHIN) 1 g in dextrose 5 % 50 mL IVPB  Status:  Discontinued     1 g 100 mL/hr over 30 Minutes Intravenous Every 24 hours 04/30/17 0252 04/30/17 0329   04/30/17 2200  cefTRIAXone (ROCEPHIN) 1 g in dextrose 5 % 50 mL IVPB  Status:  Discontinued     1 g 100 mL/hr over 30 Minutes Intravenous Every 24 hours 04/30/17 0330 05/02/17 1211   04/29/17 2230  cefTRIAXone (ROCEPHIN) 1 g in dextrose 5 % 50 mL IVPB     1 g 100 mL/hr over 30 Minutes Intravenous  Once 04/29/17 2221 04/30/17 0346     Subjective: Seen and examined and stated Right hand was better. States he feels like he can't go home and now is agreeable to SNF for completion of Abx. No CP or SOB. States leg swelling is improved.   Objective: Vitals:   05/05/17 1007 05/05/17 1257 05/05/17 1618 05/05/17 2025  BP: (!) 166/69 (!) 158/74 (!) 158/76 (!) 155/73  Pulse:  66  74  Resp:  17  16  Temp:  98.1 F (36.7 C)  97.9 F (36.6 C)  TempSrc:  Oral  Oral  SpO2:  94%  96%  Weight:      Height:        Intake/Output Summary (Last 24 hours) at 05/05/2017 2040 Last data filed at 05/05/2017 2025 Gross per 24 hour  Intake 1650 ml  Output 1100 ml  Net 550 ml   Filed Weights   04/29/17 1928  Weight: 83.5 kg (184 lb)   Examination: Physical Exam:  Constitutional: WN/WD AAM in NAD appears calm  Eyes: Sclerae anicteric. Lids normal ENMT: External Ears and Nose appear normal Neck: Supple with no JVD Respiratory: Diminished to auscultation bilaterally; No appreciable wheezing/rales/rhonchi. Normal respiratory  effort and no accessory muscle use Cardiovascular: RRR; No m/r/g. Mild LE bilateral edema Abdomen: Soft, NT, ND. Bowel sounds present GU: Deferred; Catheter in place with bluish colored urine Musculoskeletal: No Contractures, No cyanosis Skin: No rashes or lesions on a limited skin eval Neurologic: CN 2-12 grossly intact. No appreciable focal deficits Psychiatric: Normal mood and affect. Intact judgement and inisight  Data Reviewed: I have personally reviewed following labs and imaging studies  CBC: Recent Labs  Lab 04/29/17 1955 05/02/17 0622 05/04/17 0501 05/05/17 0405  WBC 13.4* 14.1* 19.6* 21.7*  NEUTROABS  --   --   --  18.9*  HGB 10.1* 8.9* 9.3* 9.2*  HCT 33.3* 30.0* 31.3* 30.6*  MCV 83.5 84.7 83.9 83.6  PLT 330 344 402* 010*   Basic Metabolic Panel: Recent Labs  Lab 04/29/17 1955 05/02/17 0622 05/03/17 0507 05/04/17 0501 05/05/17 0405  NA 139 139 141 138 138  K 4.0 4.4 4.4 4.9 4.4  CL 108 108 110 107 106  CO2  $'25 25 23 24 23  'B$ GLUCOSE 102* 107* 85 149* 122*  BUN 34* 36* 34* 41* 49*  CREATININE 1.24 1.32* 1.38* 1.68* 1.82*  CALCIUM 9.6 9.1 9.3 9.1 9.0  MG  --   --   --   --  2.3  PHOS  --   --   --   --  3.4   GFR: Estimated Creatinine Clearance: 41.4 mL/min (A) (by C-G formula based on SCr of 1.82 mg/dL (H)). Liver Function Tests: Recent Labs  Lab 04/29/17 1955 05/05/17 0405  AST 26 21  ALT 15* 11*  ALKPHOS 120 85  BILITOT 0.4 0.4  PROT 5.6* 5.0*  ALBUMIN 2.6* 2.2*   Recent Labs  Lab 04/29/17 1955  LIPASE 24   No results for input(s): AMMONIA in the last 168 hours. Coagulation Profile: No results for input(s): INR, PROTIME in the last 168 hours. Cardiac Enzymes: No results for input(s): CKTOTAL, CKMB, CKMBINDEX, TROPONINI in the last 168 hours. BNP (last 3 results) No results for input(s): PROBNP in the last 8760 hours. HbA1C: No results for input(s): HGBA1C in the last 72 hours. CBG: No results for input(s): GLUCAP in the last 168  hours. Lipid Profile: No results for input(s): CHOL, HDL, LDLCALC, TRIG, CHOLHDL, LDLDIRECT in the last 72 hours. Thyroid Function Tests: No results for input(s): TSH, T4TOTAL, FREET4, T3FREE, THYROIDAB in the last 72 hours. Anemia Panel: No results for input(s): VITAMINB12, FOLATE, FERRITIN, TIBC, IRON, RETICCTPCT in the last 72 hours. Sepsis Labs: Recent Labs  Lab 04/29/17 2255  LATICACIDVEN 0.93    Recent Results (from the past 240 hour(s))  Blood culture (routine x 2)     Status: None   Collection Time: 04/29/17 10:43 PM  Result Value Ref Range Status   Specimen Description BLOOD RIGHT HAND  Final   Special Requests IN PEDIATRIC BOTTLE Blood Culture adequate volume  Final   Culture NO GROWTH 5 DAYS  Final   Report Status 05/04/2017 FINAL  Final  Urine Culture     Status: Abnormal   Collection Time: 04/29/17 10:43 PM  Result Value Ref Range Status   Specimen Description URINE, RANDOM  Final   Special Requests NONE  Final   Culture >=100,000 COLONIES/mL PSEUDOMONAS AERUGINOSA (A)  Final   Report Status 05/02/2017 FINAL  Final   Organism ID, Bacteria PSEUDOMONAS AERUGINOSA (A)  Final      Susceptibility   Pseudomonas aeruginosa - MIC*    CEFTAZIDIME 4 SENSITIVE Sensitive     CIPROFLOXACIN >=4 RESISTANT Resistant     GENTAMICIN 4 SENSITIVE Sensitive     IMIPENEM 2 SENSITIVE Sensitive     PIP/TAZO 32 SENSITIVE Sensitive     CEFEPIME 8 SENSITIVE Sensitive     * >=100,000 COLONIES/mL PSEUDOMONAS AERUGINOSA  Blood culture (routine x 2)     Status: None   Collection Time: 04/29/17 11:23 PM  Result Value Ref Range Status   Specimen Description BLOOD BLOOD RIGHT FOREARM  Final   Special Requests   Final    BOTTLES DRAWN AEROBIC AND ANAEROBIC Blood Culture adequate volume   Culture NO GROWTH 5 DAYS  Final   Report Status 05/05/2017 FINAL  Final  MRSA PCR Screening     Status: None   Collection Time: 04/30/17  4:28 PM  Result Value Ref Range Status   MRSA by PCR NEGATIVE  NEGATIVE Final    Comment:        The GeneXpert MRSA Assay (FDA approved for NASAL specimens only), is  one component of a comprehensive MRSA colonization surveillance program. It is not intended to diagnose MRSA infection nor to guide or monitor treatment for MRSA infections.     Radiology Studies: Dg Tibia/fibula Right  Result Date: 05/04/2017 CLINICAL DATA:  Right leg swelling and pain. EXAM: RIGHT TIBIA AND FIBULA - 2 VIEW COMPARISON:  None. FINDINGS: Diffuse mild osteosclerosis. No acute fracture or dislocation. No periosteal reaction or bone destruction. Diffuse mild soft tissue edema. No soft tissue emphysema. IMPRESSION: 1.  No acute osseous injury of the right tibia and fibula. 2. Diffuse mild osteosclerosis as can be seen with myeloproliferative disease. Electronically Signed   By: Kathreen Devoid   On: 05/04/2017 13:37    Scheduled Meds: . allopurinol  150 mg Oral Daily  . aspirin  325 mg Oral Daily  . B-complex with vitamin C  1 tablet Oral Daily  . colchicine  0.6 mg Oral Daily  . diclofenac sodium  4 g Topical QID  . enoxaparin (LOVENOX) injection  40 mg Subcutaneous Q24H  . feeding supplement (GLUCERNA SHAKE)  118.5 mL Oral Daily  . ferrous gluconate  324 mg Oral Daily  . fluticasone  2 spray Each Nare Daily  . hydrALAZINE  25 mg Oral TID  . hydrocortisone cream   Topical BID  . pantoprazole  40 mg Oral Q0600   Continuous Infusions: . ceFEPime (MAXIPIME) IV Stopped (05/05/17 1432)    LOS: 5 days   Kerney Elbe, DO [facility] Pager [phone]  If 7PM-7AM, please contact night-coverage [email or web address] Password [email or web address] 05/05/2017, 8:40 PM

## 2017-05-05 NOTE — Progress Notes (Signed)
Chaplain has spent significant time with Juan [name] in [facility]. At the patient's request we have shared in a time of prayer and scripture reading. Mr. Wessinger identifies Faith as central to his life motivation. He speaks joyously of "[employer]."

## 2017-05-06 ENCOUNTER — Ambulatory Visit (HOSPITAL_COMMUNITY): Payer: Medicare Other

## 2017-05-06 LAB — CBC WITH DIFFERENTIAL/PLATELET
BASOS PCT: 0 %
Band Neutrophils: 18 %
Basophils Absolute: 0 10*3/uL (ref 0.0–0.1)
Blasts: 0 %
EOS PCT: 0 %
Eosinophils Absolute: 0 10*3/uL (ref 0.0–0.7)
HEMATOCRIT: 34.6 % — AB (ref 39.0–52.0)
Hemoglobin: 10.3 g/dL — ABNORMAL LOW (ref 13.0–17.0)
LYMPHS ABS: 7.5 10*3/uL — AB (ref 0.7–4.0)
Lymphocytes Relative: 26 %
MCH: 24.8 pg — ABNORMAL LOW (ref 26.0–34.0)
MCHC: 29.8 g/dL — AB (ref 30.0–36.0)
MCV: 83.2 fL (ref 78.0–100.0)
METAMYELOCYTES PCT: 6 %
MONOS PCT: 5 %
Monocytes Absolute: 1.4 10*3/uL — ABNORMAL HIGH (ref 0.1–1.0)
Myelocytes: 4 %
NEUTROS ABS: 19.8 10*3/uL — AB (ref 1.7–7.7)
NEUTROS PCT: 41 %
NRBC: 0 /100{WBCs}
Other: 0 %
Platelets: 541 10*3/uL — ABNORMAL HIGH (ref 150–400)
Promyelocytes Absolute: 0 %
RBC: 4.16 MIL/uL — AB (ref 4.22–5.81)
RDW: 18.6 % — AB (ref 11.5–15.5)
WBC: 28.7 10*3/uL — AB (ref 4.0–10.5)

## 2017-05-06 LAB — COMPREHENSIVE METABOLIC PANEL
ALT: 15 U/L — ABNORMAL LOW (ref 17–63)
ANION GAP: 7 (ref 5–15)
AST: 24 U/L (ref 15–41)
Albumin: 2.5 g/dL — ABNORMAL LOW (ref 3.5–5.0)
Alkaline Phosphatase: 96 U/L (ref 38–126)
BILIRUBIN TOTAL: 0.4 mg/dL (ref 0.3–1.2)
BUN: 48 mg/dL — ABNORMAL HIGH (ref 6–20)
CO2: 23 mmol/L (ref 22–32)
Calcium: 9.2 mg/dL (ref 8.9–10.3)
Chloride: 108 mmol/L (ref 101–111)
Creatinine, Ser: 1.67 mg/dL — ABNORMAL HIGH (ref 0.61–1.24)
GFR, EST AFRICAN AMERICAN: 45 mL/min — AB (ref >60)
GFR, EST NON AFRICAN AMERICAN: 38 mL/min — AB (ref >60)
Glucose, Bld: 115 mg/dL — ABNORMAL HIGH (ref 65–99)
POTASSIUM: 4.2 mmol/L (ref 3.5–5.1)
Sodium: 138 mmol/L (ref 135–145)
TOTAL PROTEIN: 5.4 g/dL — AB (ref 6.5–8.1)

## 2017-05-06 LAB — OSMOLALITY, URINE: Osmolality, Ur: 374 mOsm/kg (ref 300–900)

## 2017-05-06 LAB — PHOSPHORUS: Phosphorus: 3.2 mg/dL (ref 2.5–4.6)

## 2017-05-06 LAB — MAGNESIUM: MAGNESIUM: 2.4 mg/dL (ref 1.7–2.4)

## 2017-05-06 LAB — CREATININE, URINE, RANDOM: CREATININE, URINE: 73.72 mg/dL

## 2017-05-06 LAB — SODIUM, URINE, RANDOM: Sodium, Ur: 19 mmol/L

## 2017-05-06 NOTE — Progress Notes (Signed)
Spoke with patient re:  Tunneled PICC placement. He states he would like to speak with primary MD prior to providing consent.  Explained why this procedure is indicated and that this would help him accomplish his discharge goals.  Patient agreeable and understands, but will would like to discuss further with MD.  IR will continue to work towards consent with patient.   Brynda Greathouse, MS RD PA-C 11:22 AM

## 2017-05-06 NOTE — Social Work (Addendum)
CSW spoke briefly with pt, pt son, and MD. Pt was asking MD questions about PICC line and why he could not stay in the hospital to finish antibiotics.   Pt continued to ask MD questions regarding PICC, CSW will return to discuss SNF offers later.  1:00pm- Pt son had left but CSW spoke with pt at bedside, pt and pt son would prefer [facility] on discharge. [facility] admissions aware of family preference.   CSW continuing to follow to support pt with discharge when medically appropriate.   Alexander Mt, [place] Work [phone]

## 2017-05-06 NOTE — Progress Notes (Signed)
Patient back from Renal Ultrasound

## 2017-05-06 NOTE — Progress Notes (Signed)
Physical Therapy Treatment Patient Details Name: Juan [name] MRN: [id number] DOB: [birth year] Today's Date: 05/06/2017    History of Present Illness Juan [name] is a 76 y.o. male with medical history significant of chronic debility, myelo proliferative disorder, CHF, severe pulm HTN on 2L O2 at baseline, CKD stage 3.  Patient presents to the ED with c/o worsening dysuria.  Patient was diagnosed with E.Coli UTI on 11/26.  Treated with course of Keflex which it was sensitive to.  Initially symptoms improved some but not entirely, but after finishing course returned and got worse.  He reports dysuria, urinary frequency and urgency, lower abdominal pain.  He lives at home with son, using a walker.    PT Comments    Pt deferred gait at this time, but was agreeable to OOB to chair.  MD in room discussing SNF placement for IV antibiotic management.  Pt would benefit from SNF placement for continued rehab as he requires quite a bit of assist to get up on his feet from a regular height chair/surface.  He has limited gait distance and endurance as well.  If he has to go for IV antibiotic management, then he would benefit from daily therapy there as well.  PT will continue to follow acutely.     Follow Up Recommendations  SNF     Equipment Recommendations  None recommended by PT    Recommendations for Other Services   NA     Precautions / Restrictions Precautions Precautions: Fall    Mobility  Bed Mobility Overal bed mobility: Needs Assistance Bed Mobility: Supine to Sit     Supine to sit: Min assist;HOB elevated     General bed mobility comments: Min assist to provide hand to pull up to sitting and assist to progress bil LEs to EOB.   Transfers Overall transfer level: Needs assistance Equipment used: Rolling walker (2 wheeled) Transfers: Sit to/from Stand Sit to Stand: Min guard;From elevated surface         General transfer comment: Min guard assist from very elevated bed.  Pt  needed much more assist to get out of lower recliner chair yesterday.   Ambulation/Gait Ambulation/Gait assistance: Supervision Ambulation Distance (Feet): 5 Feet Assistive device: Rolling walker (2 wheeled) Gait Pattern/deviations: Step-through pattern;Shuffle;Trunk flexed     General Gait Details: Pt deferred further gait at this time.  He wanted to get up OOB for now and have PT check on him later to go for a walk.  supervision for safety to get from bed to chair with RW.  Pt with flexed posture at baseline, cannot get more upright even with cues.           Balance Overall balance assessment: Needs assistance Sitting-balance support: Feet supported;No upper extremity supported Sitting balance-Leahy Scale: Fair     Standing balance support: Bilateral upper extremity supported Standing balance-Leahy Scale: Poor                              Cognition Arousal/Alertness: Awake/alert Behavior During Therapy: WFL for tasks assessed/performed Overall Cognitive Status: History of cognitive impairments - at baseline                                               Pertinent Vitals/Pain Pain Assessment: Faces Faces Pain Scale: Hurts little more Pain Location:  chronic low back pain Pain Descriptors / Indicators: Aching Pain Intervention(s): Limited activity within patient's tolerance;Monitored during session;Repositioned           PT Goals (current goals can now be found in the care plan section) Acute Rehab PT Goals Patient Stated Goal: return home Progress towards PT goals: Progressing toward goals    Frequency    Min 2X/week(would benefit from more days per week at [facility])      PT Plan Discharge plan needs to be updated;Frequency needs to be updated       AM-PAC PT "6 Clicks" Daily Activity  Outcome Measure  Difficulty turning over in bed (including adjusting bedclothes, sheets and blankets)?: Unable Difficulty moving from lying on  back to sitting on the side of the bed? : Unable Difficulty sitting down on and standing up from a chair with arms (e.g., wheelchair, bedside commode, etc,.)?: Unable Help needed moving to and from a bed to chair (including a wheelchair)?: A Little Help needed walking in hospital room?: A Little Help needed climbing 3-5 steps with a railing? : A Lot 6 Click Score: 11    End of Session Equipment Utilized During Treatment: Oxygen Activity Tolerance: Patient limited by fatigue;Patient limited by pain Patient left: in chair;with call [place]/phone within reach;with family/visitor present   PT Visit Diagnosis: Unsteadiness on feet (R26.81);Muscle weakness (generalized) (M62.81);Difficulty in walking, not elsewhere classified (R26.2)     Time: 8469-6295 PT Time Calculation (min) (ACUTE ONLY): 9 min  Charges:  $Therapeutic Activity: 8-22 mins          Terence Bart B. Iliana Hutt, PT, DPT [phone]            05/06/2017, 11:52 AM

## 2017-05-06 NOTE — Progress Notes (Signed)
Physical Therapy Treatment Patient Details Name: Juan [name] MRN: [id number] DOB: [birth year] Today's Date: 05/06/2017    History of Present Illness Juan [name] is a 76 y.o. male with medical history significant of chronic debility, myelo proliferative disorder, CHF, severe pulm HTN on 2L O2 at baseline, CKD stage 3.  Patient presents to the ED with c/o worsening dysuria.  Patient was diagnosed with E.Coli UTI on 11/26.  Treated with course of Keflex which it was sensitive to.  Initially symptoms improved some but not entirely, but after finishing course returned and got worse.  He reports dysuria, urinary frequency and urgency, lower abdominal pain.  He lives at home with son, using a walker.    PT Comments    Pt agreeable to gait this PM.  He continues to have most difficulty with getting up out of the chair (lower surfaces) and balance when turning during gait.  He continues to have 2/4 DOE on 2 L O2 [place], however, can continue with only one standing rest break and O2 sats stable.  Pt remains appropriate for SNF level rehab at discharge.     Follow Up Recommendations  SNF     Equipment Recommendations  None recommended by PT    Recommendations for Other Services   NA     Precautions / Restrictions Precautions Precautions: Fall    Mobility  Bed Mobility Overal bed mobility: Needs Assistance Bed Mobility: Sit to Supine     Supine to sit: Min assist;HOB elevated Sit to supine: Mod assist   General bed mobility comments: Mod assist to help lift bil legs back into bed.   Transfers Overall transfer level: Needs assistance Equipment used: Rolling walker (2 wheeled) Transfers: Sit to/from Stand Sit to Stand: Mod assist         General transfer comment: Mod assist from low recliner chair.  Heavy reliance on PT and hands to push up to stand.   Ambulation/Gait Ambulation/Gait assistance: Supervision Ambulation Distance (Feet): 85 Feet Assistive device: Rolling walker (2  wheeled) Gait Pattern/deviations: Step-through pattern;Shuffle;Trunk flexed Gait velocity: decreased Gait velocity interpretation: Below normal speed for age/gender General Gait Details: Pt unable to stand taller due to long standing h/o low back pain and walking stooped for years. Pt min guard assist for turns with cues to keep the RW on the floor, he likes to pick it up and turn all the way around with it in the air which is not safe.        Balance Overall balance assessment: Needs assistance Sitting-balance support: Feet supported;No upper extremity supported Sitting balance-Leahy Scale: Fair     Standing balance support: Bilateral upper extremity supported Standing balance-Leahy Scale: Poor                              Cognition Arousal/Alertness: Awake/alert Behavior During Therapy: WFL for tasks assessed/performed Overall Cognitive Status: History of cognitive impairments - at baseline                                               Pertinent Vitals/Pain Pain Assessment: Faces Faces Pain Scale: Hurts little more Pain Location: chronic low back pain Pain Descriptors / Indicators: Aching Pain Intervention(s): Limited activity within patient's tolerance;Monitored during session;Repositioned       Prior Function  PT Goals (current goals can now be found in the care plan section) Acute Rehab PT Goals Patient Stated Goal: return home Progress towards PT goals: Progressing toward goals    Frequency    Min 2X/week(would benefit from daily therapy at SNF)      PT Plan Current plan remains appropriate       AM-PAC PT "6 Clicks" Daily Activity  Outcome Measure  Difficulty turning over in bed (including adjusting bedclothes, sheets and blankets)?: Unable Difficulty moving from lying on back to sitting on the side of the bed? : Unable Difficulty sitting down on and standing up from a chair with arms (e.g., wheelchair,  bedside commode, etc,.)?: Unable Help needed moving to and from a bed to chair (including a wheelchair)?: A Little Help needed walking in hospital room?: A Little Help needed climbing 3-5 steps with a railing? : A Lot 6 Click Score: 11    End of Session Equipment Utilized During Treatment: Oxygen(2 L O2 [place]) Activity Tolerance: Patient limited by fatigue;Patient limited by pain Patient left: with call [place]/phone within reach;in bed   PT Visit Diagnosis: Unsteadiness on feet (R26.81);Muscle weakness (generalized) (M62.81);Difficulty in walking, not elsewhere classified (R26.2)     Time: 5573-2202 PT Time Calculation (min) (ACUTE ONLY): 24 min  Charges:  $Gait Training: 23-37 mins $Therapeutic Activity: 8-22 mins      Juan [name], PT, DPT [phone]           05/06/2017, 3:06 PM

## 2017-05-06 NOTE — Progress Notes (Signed)
PROGRESS NOTE    Juan [name]  MGQ:[id number] DOB: 1941-01-29 DOA: 04/29/2017 PCP: Colon Branch, MD   Brief Narrative:  Juan [name] a 76 y.o.malewith medical history significant ofchronic debility, myelo proliferative disorder, CHF, severe pulm HTN on 2L O2 at baseline, CKD stage 3 and other comorbids. Patient presents to the ED with c/o worsening dysuria. Patient was diagnosed with E.Coli UTI on 11/26. Treated with course of Keflex which it was sensitive to. Initially symptoms improved some but not entirely, but after finishing course returned and got worse. He reports dysuria, urinary frequency and urgency, lower abdominal pain. Admitted for Pseudomonas UTI and Right sided Pyelonephritis. Hospitalization has been complicated by Gout and an AKI on CKD. PICC line was to be placed today but patient refused PICC and wanted to see if he could stay in the Hospital to complete IV Abx. I discussed with the patient that it was recommended to go to SNF for rehab and continuing IV Abx. Patient was to discuss with Family further and discuss with the [facility] team about staying to complete course of treatment.   Assessment & Plan:   Principal Problem:   Acute lower UTI Active Problems:   Essential hypertension   Chronic diastolic CHF (congestive heart failure) (HCC)   CKD (chronic kidney disease) stage 3, GFR 30-59 ml/min (HCC)   Pulmonary hypertension severe   Pyelonephritis   Acute Pseudomonas lower UTI, Right-sided pyelonephritis -CT abdomen pelvis shows right-sided pyelonephritis, urethritis, no significant hydronephrosis, concern for cystitis or chronic inflammation.  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  -Zosyn changed to IV Cefepime; Will need PICC line and discussed with Nephro and  will need IJ PICC Line.   -PT evaluation if he needs skilled nursing facility versus home health and they recommended Home Health PT initially; Patient does not want home health and does not feel like son Can administer Abx; After Re-evaluation they recommended SNF [facility] for completion of IV Abx  -IR Consulted for IJ PICC Placement; PICC was to be placed today but patient refused and wanted to know if he could stay in the hospital to complete IV Therapy and not get PICC -I discussed with him about the benefits of PICC and Rehab and he stated he would think about it overnight.  -Will need to speak to IR in AM to have PICC line placement   Acute Gout Right Hand, improved -Patient reports that he woke up yesterday morning with significant pain in his right hand, feels miserable.  On examination does not appears to have any fracture, range of movement  -CRP was 3.2 and ESR was 69 -Urice Acid was 8.7 -C/w Colchicine, allopurinol,  -Prednisone now D/C'd    -1 dose of Toradol 15 mg IV x1 given while hospitalized   Essential Hypertension -Currently stable at 141/69 -Continue Hydralazine 25 mg po TID  Chronic diastolic CHF (congestive heart failure) (HCC) -Negative balance of 1.735 L, compensated, stable.   -Continue to hold Lasix until patient eating good.     AKI on CKD (chronic kidney disease) stage 3, GFR 30-59 ml/min (HCC) -Creatinine slightly higher than baseline at 1.67, baseline creatinine 1.2-1.3 -Continue to hold Lasix -Will check Urine Lytes and get  -Retroperitoneal U/S showed Cholelithiasis. Small RIGHT renal cyst.  Medical renal disease changes of both  kidneys. Multiple bladder diverticula, bladder wall thickening and trabeculation question chronic outlet obstruction. 17 mm echogenic focus with minimal shadowing at inferior pole of LEFT kidney, uncertain etiology; sonographic appearance suggests potential kidney stone though no calculus is  evident by CT. This could represent a noncalcified calculus, air within the collecting system if patient had recent instrumentation; no stent seen on prior CT, nor evidence of a fat containing mass on prior CT exam. -Repeat CMP in AM  -If continues to worsen will discuss with Nephrology and obtain formal consultation   Pulmonary Hypertension, severe -Continue O2 via nasal cannula  Debility, Myeloproliferative disease -PT initially recommending Home Health but now recommending SNF  Right Leg Pain and Swelling -Patient states he hit his leg -Checked Right Tibia/Fibula and showed No acute osseous injury of the right tibia and fibula. Diffuse mild osteosclerosis as can be seen with myeloproliferative disease. -Continue to Monitor  Leukocytosis -Patient's WBC 14.1 -> 19.6 -> 21.7 -> 28.7 -Suspect that it is Related to Prednisone 60 mg po Daily that patient got for 3 days  -C/w Current Abx -Continue to Monitor for S/Sx of Infection -Repeat CBC in AM   Anemia of Chronic Disease -Hb/Hct was 10.3/34.6 -C/w Ferrous Gluconate 324 mg po Daily -Continue to Monitor for S/Sx of Bleeding -Repeat CBC in AM   DVT prophylaxis: Enoxaparin 40 mg sq q24h Code Status: FULL CODE Family Communication: No family present at bedside Disposition Plan: SNF once PICC is placed and Cr improves more   Consultants:   None   Procedures:  None   Antimicrobials:  Anti-infectives (From admission, onward)   Start     Dose/Rate Route Frequency Ordered Stop   05/05/17 1400  ceFEPIme (MAXIPIME) 2 g in dextrose 5 % 50 mL IVPB     2 g 100 mL/hr over 30 Minutes Intravenous Every 24 hours 05/05/17 0850     05/02/17 1300  piperacillin-tazobactam (ZOSYN) IVPB 3.375 g  Status:  Discontinued     3.375 g 12.5 mL/hr over 240 Minutes Intravenous Every 8 hours 05/02/17 1211 05/05/17 0850   04/30/17 2200  cefTRIAXone (ROCEPHIN) 1 g in dextrose 5 % 50 mL IVPB  Status:  Discontinued     1 g 100 mL/hr over 30  Minutes Intravenous Every 24 hours 04/30/17 0252 04/30/17 0329   04/30/17 2200  cefTRIAXone (ROCEPHIN) 1 g in dextrose 5 % 50 mL IVPB  Status:  Discontinued     1 g 100 mL/hr over 30 Minutes Intravenous Every 24 hours 04/30/17 0330 05/02/17 1211   04/29/17 2230  cefTRIAXone (ROCEPHIN) 1 g in dextrose 5 % 50 mL IVPB     1 g 100 mL/hr over 30 Minutes Intravenous  Once 04/29/17 2221 04/30/17 0346     Subjective: Seen and examined and went down to get PICC and refused. Wanted to think about it. Also wanted to know if he could stay in the hospital for completion and I recommended SNF. He was hesitant but wanted to speak with family and think about it. States he is feeling a lot better. No CP or SOB.    Objective: Vitals:   05/05/17 2025 05/06/17 0500 05/06/17 1407 05/06/17 2058  BP: (!) 155/73 (!) 153/77 (!) 150/70 (!) 141/69  Pulse: 74 70 74 77  Resp: _0 Temp: 97.9 F (36.6 C) 98.1 F (36.7 C) 98.2 F (36.8 C) 98.1 F (36.7 C)  TempSrc: Oral Oral Oral Oral  SpO2: 96% 99% 97% 97%  Weight:  Height:        Intake/Output Summary (Last 24 hours) at 05/06/2017 2225 Last data filed at 05/06/2017 2059 Gross per 24 hour  Intake 1180 ml  Output 1400 ml  Net -220 ml   Filed Weights   04/29/17 1928  Weight: 83.5 kg (184 lb)   Examination: Physical Exam:  Constitutional: WN/WD AAM in NAD appears calm Eyes: Sclerae anicteric. Lids normal ENMT: External Ears and nose appear normal Neck: Supple with no JVD Respiratory: Diminished to auscultation bilaterally. No appreciable wheezing/rales/rhonchi. Normal respiratory effort Cardiovascular: RRR; No m/r/g. Mild LE bilateral edema Abdomen: Soft, NT, ND. Bowel sounds present GU: Deferred. Catheter in place with blue color urine Musculoskeletal: No contractures. No cyanosis Skin: No rashes or lesions on a limited skin eval Neurologic: CN 2-12 grossly intact. No appreciable focal deficits Psychiatric: Normal mood and affect.  Intact judgement and insight  Data Reviewed: I have personally reviewed following labs and imaging studies  CBC: Recent Labs  Lab 05/02/17 0622 05/04/17 0501 05/05/17 0405 05/06/17 0626  WBC 14.1* 19.6* 21.7* 28.7*  NEUTROABS  --   --  18.9* 19.8*  HGB 8.9* 9.3* 9.2* 10.3*  HCT 30.0* 31.3* 30.6* 34.6*  MCV 84.7 83.9 83.6 83.2  PLT 344 402* 443* 474*   Basic Metabolic Panel: Recent Labs  Lab 05/02/17 0622 05/03/17 0507 05/04/17 0501 05/05/17 0405 05/06/17 0626  NA 139 141 138 138 138  K 4.4 4.4 4.9 4.4 4.2  CL 108 110 107 106 108  CO2 _0 GLUCOSE 107* 85 149* 122* 115*  BUN 36* 34* 41* 49* 48*  CREATININE 1.32* 1.38* 1.68* 1.82* 1.67*  CALCIUM 9.1 9.3 9.1 9.0 9.2  MG  --   --   --  2.3 2.4  PHOS  --   --   --  3.4 3.2   GFR: Estimated Creatinine Clearance: 45.1 mL/min (A) (by C-G formula based on SCr of 1.67 mg/dL (H)). Liver Function Tests: Recent Labs  Lab 05/05/17 0405 05/06/17 0626  AST 21 24  ALT 11* 15*  ALKPHOS 85 96  BILITOT 0.4 0.4  PROT 5.0* 5.4*  ALBUMIN 2.2* 2.5*   No results for input(s): LIPASE, AMYLASE in the last 168 hours. No results for input(s): AMMONIA in the last 168 hours. Coagulation Profile: No results for input(s): INR, PROTIME in the last 168 hours. Cardiac Enzymes: No results for input(s): CKTOTAL, CKMB, CKMBINDEX, TROPONINI in the last 168 hours. BNP (last 3 results) No results for input(s): PROBNP in the last 8760 hours. HbA1C: No results for input(s): HGBA1C in the last 72 hours. CBG: No results for input(s): GLUCAP in the last 168 hours. Lipid Profile: No results for input(s): CHOL, HDL, LDLCALC, TRIG, CHOLHDL, LDLDIRECT in the last 72 hours. Thyroid Function Tests: No results for input(s): TSH, T4TOTAL, FREET4, T3FREE, THYROIDAB in the last 72 hours. Anemia Panel: No results for input(s): VITAMINB12, FOLATE, FERRITIN, TIBC, IRON, RETICCTPCT in the last 72 hours. Sepsis Labs: Recent Labs  Lab  04/29/17 2255  LATICACIDVEN 0.93    Recent Results (from the past 240 hour(s))  Blood culture (routine x 2)     Status: None   Collection Time: 04/29/17 10:43 PM  Result Value Ref Range Status   Specimen Description BLOOD RIGHT HAND  Final   Special Requests IN PEDIATRIC BOTTLE Blood Culture adequate volume  Final   Culture NO GROWTH 5 DAYS  Final   Report Status 05/04/2017 FINAL  Final  Urine Culture  Status: Abnormal   Collection Time: 04/29/17 10:43 PM  Result Value Ref Range Status   Specimen Description URINE, RANDOM  Final   Special Requests NONE  Final   Culture >=100,000 COLONIES/mL PSEUDOMONAS AERUGINOSA (A)  Final   Report Status 05/02/2017 FINAL  Final   Organism ID, Bacteria PSEUDOMONAS AERUGINOSA (A)  Final      Susceptibility   Pseudomonas aeruginosa - MIC*    CEFTAZIDIME 4 SENSITIVE Sensitive     CIPROFLOXACIN >=4 RESISTANT Resistant     GENTAMICIN 4 SENSITIVE Sensitive     IMIPENEM 2 SENSITIVE Sensitive     PIP/TAZO 32 SENSITIVE Sensitive     CEFEPIME 8 SENSITIVE Sensitive     * >=100,000 COLONIES/mL PSEUDOMONAS AERUGINOSA  Blood culture (routine x 2)     Status: None   Collection Time: 04/29/17 11:23 PM  Result Value Ref Range Status   Specimen Description BLOOD BLOOD RIGHT FOREARM  Final   Special Requests   Final    BOTTLES DRAWN AEROBIC AND ANAEROBIC Blood Culture adequate volume   Culture NO GROWTH 5 DAYS  Final   Report Status 05/05/2017 FINAL  Final  MRSA PCR Screening     Status: None   Collection Time: 04/30/17  4:28 PM  Result Value Ref Range Status   MRSA by PCR NEGATIVE NEGATIVE Final    Comment:        The GeneXpert MRSA Assay (FDA approved for NASAL specimens only), is one component of a comprehensive MRSA colonization surveillance program. It is not intended to diagnose MRSA infection nor to guide or monitor treatment for MRSA infections.     Radiology Studies: US Renal  Result Date: 05/06/2017 CLINICAL DATA:  Acute kidney  injury superimposed upon chronic kidney disease, history hypertension, type II diabetes mellitus EXAM: RENAL / URINARY TRACT ULTRASOUND COMPLETE COMPARISON:  CT abdomen and pelvis 04/30/2017 FINDINGS: Right Kidney: Length: 10.8 cm. Normal cortical thickness. Increased cortical echogenicity. Cyst at inferior pole 3.0 x 2.7 x 2.2 cm with question minimal dependent debris. No additional mass, hydronephrosis or shadowing calcification Left Kidney: Length: 12.2 cm. Normal cortical thickness. Increased cortical echogenicity. Large echogenic focus 17 mm diameter at inferior pole with only minimal shadowing, uncertain etiology; appearance suggests a calculus but no calcification is seen on recent CT. No mass or hydronephrosis. Bladder: Multiple bladder diverticula. Dependent hypoechoic material within urinary bladder question debris. Scattered bladder wall thickening and trabeculation. Incidentally noted cholelithiasis. IMPRESSION: Cholelithiasis. Small RIGHT renal cyst. Medical renal disease changes of both kidneys. Multiple bladder diverticula, bladder wall thickening and trabeculation question chronic outlet obstruction. 17 mm echogenic focus with minimal shadowing at inferior pole of LEFT kidney, uncertain etiology; sonographic appearance suggests potential kidney stone though no calculus is evident by CT. This could represent a noncalcified calculus, air within the collecting system if patient had recent instrumentation; no stent seen on prior CT, nor evidence of a fat containing mass on prior CT exam. Electronically Signed   By: Lavonia Dana M.D.   On: 05/06/2017 11:11    Scheduled Meds: . allopurinol  150 mg Oral Daily  . aspirin  325 mg Oral Daily  . B-complex with vitamin C  1 tablet Oral Daily  . colchicine  0.6 mg Oral Daily  . diclofenac sodium  4 g Topical QID  . enoxaparin (LOVENOX) injection  40 mg Subcutaneous Q24H  . feeding supplement (GLUCERNA SHAKE)  118.5 mL Oral Daily  . ferrous gluconate   324 mg Oral Daily  . fluticasone  2 spray Each Nare Daily  . hydrALAZINE  25 mg Oral TID  . hydrocortisone cream   Topical BID  . pantoprazole  40 mg Oral Q0600   Continuous Infusions: . ceFEPime (MAXIPIME) IV 2 g (05/06/17 1317)    LOS: 6 days   Kerney Elbe, DO [facility] Pager [phone]  If 7PM-7AM, please contact night-coverage [email or web address] Password [facility] 05/06/2017, 10:25 PM

## 2017-05-07 ENCOUNTER — Inpatient Hospital Stay (HOSPITAL_COMMUNITY): Payer: Medicare Other

## 2017-05-07 ENCOUNTER — Encounter (HOSPITAL_COMMUNITY): Payer: Self-pay | Admitting: Interventional Radiology

## 2017-05-07 DIAGNOSIS — N189 Chronic kidney disease, unspecified: Secondary | ICD-10-CM

## 2017-05-07 DIAGNOSIS — N179 Acute kidney failure, unspecified: Secondary | ICD-10-CM

## 2017-05-07 HISTORY — PX: IR FLUORO RM 30-60 MIN: IMG2384

## 2017-05-07 HISTORY — PX: IR US GUIDE VASC ACCESS RIGHT: IMG2390

## 2017-05-07 HISTORY — PX: IR FLUORO GUIDE CV LINE RIGHT: IMG2283

## 2017-05-07 LAB — CBC WITH DIFFERENTIAL/PLATELET
Band Neutrophils: 18 %
Basophils Absolute: 0 10*3/uL (ref 0.0–0.1)
Basophils Relative: 0 %
Blasts: 0 %
EOS PCT: 7 %
Eosinophils Absolute: 1.9 10*3/uL — ABNORMAL HIGH (ref 0.0–0.7)
HCT: 34.4 % — ABNORMAL LOW (ref 39.0–52.0)
Hemoglobin: 10.2 g/dL — ABNORMAL LOW (ref 13.0–17.0)
LYMPHS ABS: 7.2 10*3/uL — AB (ref 0.7–4.0)
Lymphocytes Relative: 26 %
MCH: 25.2 pg — AB (ref 26.0–34.0)
MCHC: 29.7 g/dL — ABNORMAL LOW (ref 30.0–36.0)
MCV: 85.1 fL (ref 78.0–100.0)
MONOS PCT: 12 %
Metamyelocytes Relative: 6 %
Monocytes Absolute: 3.3 10*3/uL — ABNORMAL HIGH (ref 0.1–1.0)
Myelocytes: 3 %
NEUTROS ABS: 15.2 10*3/uL — AB (ref 1.7–7.7)
NEUTROS PCT: 28 %
NRBC: 2 /100{WBCs} — AB
OTHER: 0 %
PLATELETS: 581 10*3/uL — AB (ref 150–400)
Promyelocytes Absolute: 0 %
RBC: 4.04 MIL/uL — AB (ref 4.22–5.81)
RDW: 19.1 % — AB (ref 11.5–15.5)
WBC Morphology: INCREASED
WBC: 27.6 10*3/uL — AB (ref 4.0–10.5)

## 2017-05-07 LAB — COMPREHENSIVE METABOLIC PANEL
ALT: 25 U/L (ref 17–63)
AST: 33 U/L (ref 15–41)
Albumin: 2.6 g/dL — ABNORMAL LOW (ref 3.5–5.0)
Alkaline Phosphatase: 92 U/L (ref 38–126)
Anion gap: 8 (ref 5–15)
BUN: 46 mg/dL — ABNORMAL HIGH (ref 6–20)
CHLORIDE: 111 mmol/L (ref 101–111)
CO2: 21 mmol/L — ABNORMAL LOW (ref 22–32)
Calcium: 9.4 mg/dL (ref 8.9–10.3)
Creatinine, Ser: 1.45 mg/dL — ABNORMAL HIGH (ref 0.61–1.24)
GFR, EST AFRICAN AMERICAN: 53 mL/min — AB (ref >60)
GFR, EST NON AFRICAN AMERICAN: 46 mL/min — AB (ref >60)
Glucose, Bld: 84 mg/dL (ref 65–99)
POTASSIUM: 4.5 mmol/L (ref 3.5–5.1)
Sodium: 140 mmol/L (ref 135–145)
TOTAL PROTEIN: 5.4 g/dL — AB (ref 6.5–8.1)
Total Bilirubin: 0.5 mg/dL (ref 0.3–1.2)

## 2017-05-07 LAB — PHOSPHORUS: PHOSPHORUS: 3.5 mg/dL (ref 2.5–4.6)

## 2017-05-07 LAB — MAGNESIUM: MAGNESIUM: 2.6 mg/dL — AB (ref 1.7–2.4)

## 2017-05-07 LAB — HEMOGLOBIN AND HEMATOCRIT, BLOOD
HEMATOCRIT: 32.7 % — AB (ref 39.0–52.0)
Hemoglobin: 9.9 g/dL — ABNORMAL LOW (ref 13.0–17.0)

## 2017-05-07 MED ORDER — CHLORHEXIDINE GLUCONATE 4 % EX LIQD
CUTANEOUS | Status: AC
  Filled 2017-05-07: qty 15

## 2017-05-07 MED ORDER — LIDOCAINE-EPINEPHRINE (PF) 2 %-1:200000 IJ SOLN
INTRAMUSCULAR | Status: DC | PRN
  Administered 2017-05-07: 10 mL

## 2017-05-07 MED ORDER — LIDOCAINE HCL (PF) 1 % IJ SOLN
INTRAMUSCULAR | Status: DC | PRN
  Administered 2017-05-07: 3 mL

## 2017-05-07 MED ORDER — LIDOCAINE-EPINEPHRINE (PF) 1 %-1:200000 IJ SOLN
INTRAMUSCULAR | Status: AC
  Filled 2017-05-07: qty 30

## 2017-05-07 MED ORDER — GELATIN ABSORBABLE 12-7 MM EX MISC
CUTANEOUS | Status: AC
  Filled 2017-05-07: qty 1

## 2017-05-07 MED ORDER — LIDOCAINE HCL 1 % IJ SOLN
INTRAMUSCULAR | Status: AC
  Filled 2017-05-07: qty 20

## 2017-05-07 NOTE — Progress Notes (Signed)
14:00 received call from Pt's RN to come check drsg d/t bleeding. Arrived at pt's room [facility] find [facility]pin[facility]? Pam holding pressure on site. She stated she had assistant go get some gelfoam to apply.

## 2017-05-07 NOTE — Consult Note (Signed)
   [facility] Inpatient Consult   05/07/2017  Juan [name] 02/21/1941 [id number]  Patient reviewed for high risk re-admission in the [place].  This is the patient's 4th hospitalization in 6 months.  Patient is in the [place] of the [facility] [employer] services under patient's Medicare plan.  [facility] [facility] have made several attempts to out reach to patient and patient generally does not maintain contact or engage with [facility] staff nor home health.  Spoke with inpatient social worker that the patient is currently agreeable to SNF placement for IV antibiotics.  Patient in agreement with [facility] for post hospital needs . Patient will needs met at this level of care. For questions contact:   [name], RN BSN [facility] Liaison  [phone] business mobile phone Toll free office [phone]

## 2017-05-07 NOTE — Social Work (Addendum)
CSW awaiting notice of pt receiving PICC line in order to facilitate discharge to SNF. Pt requested [facility]; [facility] admissions are aware.   11:25am- CSW aware that pt has had PICC line placed. Paged MD regarding discharge timing.   CSW continuing to follow to support discharge when medically appropriate.   Alexander Mt, [place] Work [phone]

## 2017-05-07 NOTE — Progress Notes (Signed)
[facility]                                                                              Patient Demographics  Juan [name], is a 76 y.o. male, DOB - [birth year], INO:[id number]  Admit date - 04/29/2017   Admitting Physician Etta Quill, DO  Outpatient Primary MD for the patient is Colon Branch, MD  Outpatient specialists:   LOS - 7  days   Medical records reviewed and are as summarized below:    Chief Complaint  Patient presents with  . Abdominal Pain  . Urinary Tract Infection       Brief summary   Juan [name] a 76 y.o.malewith medical history significant ofchronic debility, myelo proliferative disorder, CHF, severe pulm HTN on 2L O2 at baseline, CKD stage 3 and other comorbids. Patient presents to the ED with c/o worsening dysuria. Patient was diagnosed with E.Coli UTI on 11/26. Treated with course of Keflex which it was sensitive to. Initially symptoms improved some but not entirely, but after finishing course returned and got worse. He reported dysuria, urinary frequency and urgency, lower abdominal pain. Admitted for Pseudomonas UTI and Right sided Pyelonephritis. Hospitalization has been complicated by Gout and an AKI on CKD.  Assessment & Plan    Principal Problem:   Acute Pseudomonas lower UTI, right-sided pyelonephritis  -CT abdomen showed right-sided pyelonephritis, urethritis, no significant hydronephrosis.  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 -Tunneled PICC placed today, need to be reevaluated by IV nurse, IR due to bleeding, stat H&H ordered.  Patient agreeable for skilled nursing facility  Active Problems: Acute gout right hand -Improved -CRP 3.2, ESR 69, uric acid 8.7. -Continue  allopurinol, colchicine, completed prednisone    Essential hypertension -Currently stable, continue hydralazine    Chronic diastolic CHF (congestive heart failure) (HCC) -Continue to hold Lasix for now until creatinine function improves, negative balance of 2.2 L  Mild acute on CKD (chronic kidney disease) stage 3, GFR 30-59 ml/min (HCC) -Baseline creatinine 1.2-1.3 -Plateaued at 1.8 now improving to 1.4 today -Renal ultrasound showed cholelithiasis with medical renal disease of both kidneys, multiple bladder diverticula, bladder wall thickening and trabeculation questioning chronic outlet obstruction.  17 mm echogenic focus with minimal shadowing at the inferior pole of left kidney could represent noncalcified calculus  Anemia of chronic disease -Patient is stable, continue ferrous gluconate  Right leg pain, swelling -Right tib-fib x-ray showed no acute osseous injury to the right tibia and fibula.  Diffuse mild osteosclerosis as can be seen with myeloproliferative disease  Code Status: Full CODE STATUS DVT Prophylaxis:  Lovenox  Family Communication: Discussed in detail with the patient, all imaging results, lab results explained to the patient or *   Disposition Plan: Awaiting skilled nursing facility once cleared from IV team Time Spent in minutes  25 minutes  Procedures:  Tunneled PICC  Consultants:   Interventional radiology  Antimicrobials:      Medications  Scheduled Meds: .  allopurinol  150 mg Oral Daily  . aspirin  325 mg Oral Daily  . B-complex with vitamin C  1 tablet Oral Daily  . colchicine  0.6 mg Oral Daily  . diclofenac sodium  4 g Topical QID  . enoxaparin (LOVENOX) injection  40 mg Subcutaneous Q24H  . feeding supplement (GLUCERNA SHAKE)  118.5 mL Oral Daily  . ferrous gluconate  324 mg Oral Daily  . fluticasone  2 spray Each Nare Daily  . hydrALAZINE  25 mg Oral TID  . hydrocortisone cream   Topical BID  . lidocaine      . pantoprazole  40 mg  Oral Q0600   Continuous Infusions: . ceFEPime (MAXIPIME) IV 2 g (05/06/17 1317)   PRN Meds:.acetaminophen **OR** acetaminophen, gabapentin, hydrocerin, hydrOXYzine, lidocaine (PF), ondansetron **OR** ondansetron (ZOFRAN) IV, polyethylene glycol   Antibiotics   Anti-infectives (From admission, onward)   Start     Dose/Rate Route Frequency Ordered Stop   05/05/17 1400  ceFEPIme (MAXIPIME) 2 g in dextrose 5 % 50 mL IVPB     2 g 100 mL/hr over 30 Minutes Intravenous Every 24 hours 05/05/17 0850     05/02/17 1300  piperacillin-tazobactam (ZOSYN) IVPB 3.375 g  Status:  Discontinued     3.375 g 12.5 mL/hr over 240 Minutes Intravenous Every 8 hours 05/02/17 1211 05/05/17 0850   04/30/17 2200  cefTRIAXone (ROCEPHIN) 1 g in dextrose 5 % 50 mL IVPB  Status:  Discontinued     1 g 100 mL/hr over 30 Minutes Intravenous Every 24 hours 04/30/17 0252 04/30/17 0329   04/30/17 2200  cefTRIAXone (ROCEPHIN) 1 g in dextrose 5 % 50 mL IVPB  Status:  Discontinued     1 g 100 mL/hr over 30 Minutes Intravenous Every 24 hours 04/30/17 0330 05/02/17 1211   04/29/17 2230  cefTRIAXone (ROCEPHIN) 1 g in dextrose 5 % 50 mL IVPB     1 g 100 mL/hr over 30 Minutes Intravenous  Once 04/29/17 2221 04/30/17 0346        Subjective:   Juan [name] was seen and examined today. Patient denies dizziness, chest pain, shortness of breath, abdominal pain, N/V/D/C, new weakness, numbess, tingling. No acute events overnight.    Objective:   Vitals:   05/06/17 1407 05/06/17 2058 05/07/17 0446 05/07/17 1153  BP: (!) 150/70 (!) 141/69 131/69 (!) 156/62  Pulse: 74 77 75   Resp: _0 Temp: 98.2 F (36.8 C) 98.1 F (36.7 C) 97.8 F (36.6 C)   TempSrc: Oral Oral Oral   SpO2: 97% 97% 98%   Weight:      Height:        Intake/Output Summary (Last 24 hours) at 05/07/2017 1402 Last data filed at 05/07/2017 0600 Gross per 24 hour  Intake 880 ml  Output 1150 ml  Net -270 ml     Wt Readings from Last 3  Encounters:  04/29/17 83.5 kg (184 lb)  04/12/17 78.9 kg (174 lb)  03/30/17 78.9 kg (174 lb)     Exam  General: Alert and oriented x 3, NAD  Eyes: PERRLA,  HEENT:  Atraumatic, normocephalic, normal oropharynx  Cardiovascular: S1 S2 auscultated, Regular rate and rhythm.  Respiratory: Clear to auscultation bilaterally, no wheezing, rales or rhonchi  Gastrointestinal: Soft, nontender, nondistended, + bowel sounds  Ext: no pedal edema bilaterally  Neuro: no new deficits  Musculoskeletal: No digital cyanosis, clubbing  Skin: No rashes  Psych: Normal affect and demeanor, alert and oriented  x3    Data Reviewed:  I have personally reviewed following labs and imaging studies  Micro Results Recent Results (from the past 240 hour(s))  Blood culture (routine x 2)     Status: None   Collection Time: 04/29/17 10:43 PM  Result Value Ref Range Status   Specimen Description BLOOD RIGHT HAND  Final   Special Requests IN PEDIATRIC BOTTLE Blood Culture adequate volume  Final   Culture NO GROWTH 5 DAYS  Final   Report Status 05/04/2017 FINAL  Final  Urine Culture     Status: Abnormal   Collection Time: 04/29/17 10:43 PM  Result Value Ref Range Status   Specimen Description URINE, RANDOM  Final   Special Requests NONE  Final   Culture >=100,000 COLONIES/mL PSEUDOMONAS AERUGINOSA (A)  Final   Report Status 05/02/2017 FINAL  Final   Organism ID, Bacteria PSEUDOMONAS AERUGINOSA (A)  Final      Susceptibility   Pseudomonas aeruginosa - MIC*    CEFTAZIDIME 4 SENSITIVE Sensitive     CIPROFLOXACIN >=4 RESISTANT Resistant     GENTAMICIN 4 SENSITIVE Sensitive     IMIPENEM 2 SENSITIVE Sensitive     PIP/TAZO 32 SENSITIVE Sensitive     CEFEPIME 8 SENSITIVE Sensitive     * >=100,000 COLONIES/mL PSEUDOMONAS AERUGINOSA  Blood culture (routine x 2)     Status: None   Collection Time: 04/29/17 11:23 PM  Result Value Ref Range Status   Specimen Description BLOOD BLOOD RIGHT FOREARM  Final    Special Requests   Final    BOTTLES DRAWN AEROBIC AND ANAEROBIC Blood Culture adequate volume   Culture NO GROWTH 5 DAYS  Final   Report Status 05/05/2017 FINAL  Final  MRSA PCR Screening     Status: None   Collection Time: 04/30/17  4:28 PM  Result Value Ref Range Status   MRSA by PCR NEGATIVE NEGATIVE Final    Comment:        The GeneXpert MRSA Assay (FDA approved for NASAL specimens only), is one component of a comprehensive MRSA colonization surveillance program. It is not intended to diagnose MRSA infection nor to guide or monitor treatment for MRSA infections.     Radiology Reports Dg Tibia/fibula Right  Result Date: 05/04/2017 CLINICAL DATA:  Right leg swelling and pain. EXAM: RIGHT TIBIA AND FIBULA - 2 VIEW COMPARISON:  None. FINDINGS: Diffuse mild osteosclerosis. No acute fracture or dislocation. No periosteal reaction or bone destruction. Diffuse mild soft tissue edema. No soft tissue emphysema. IMPRESSION: 1.  No acute osseous injury of the right tibia and fibula. 2. Diffuse mild osteosclerosis as can be seen with myeloproliferative disease. Electronically Signed   By: Kathreen Devoid   On: 05/04/2017 13:37   Ct Abdomen Pelvis W Contrast  Result Date: 04/30/2017 CLINICAL DATA:  Chronic severe groin pain, with infection.  Dysuria. EXAM: CT ABDOMEN AND PELVIS WITH CONTRAST TECHNIQUE: Multidetector CT imaging of the abdomen and pelvis was performed using the standard protocol following bolus administration of intravenous contrast. CONTRAST:  156m ISOVUE-300 IOPAMIDOL (ISOVUE-300) INJECTION 61% COMPARISON:  CT of the abdomen and pelvis performed 12/23/2016, and renal ultrasound performed 02/08/2017 FINDINGS: Lower chest: Right basilar airspace opacity likely reflects atelectasis. Scattered coronary artery calcifications are seen. Hepatobiliary: The liver is unremarkable in appearance. The gallbladder is unremarkable in appearance. The common bile duct remains normal in caliber.  Pancreas:  The pancreas is within normal limits. Spleen: An apparent peripheral infarct is noted within the spleen. The spleen is  enlarged, measuring 17.9 cm in length. Adrenals/Urinary Tract: The adrenal glands are unremarkable in appearance. Nonspecific perinephric stranding is noted bilaterally. A right renal cyst is noted. There is mild wall enhancement along the right renal pelvis and proximal ureter, raising concern for right-sided pyelonephritis and ureteritis. No significant hydronephrosis is seen. No renal or ureteral stones are identified. Stomach/Bowel: The stomach is unremarkable in appearance. The small bowel is within normal limits. The appendix is normal in caliber, without evidence of appendicitis. Scattered diverticulosis is noted along the descending and sigmoid colon, without evidence of diverticulitis. Vascular/Lymphatic: Scattered calcification is seen along the abdominal aorta and its branches. The abdominal aorta is otherwise grossly unremarkable. The inferior vena cava is grossly unremarkable. No retroperitoneal lymphadenopathy is seen. No pelvic sidewall lymphadenopathy is identified. Reproductive: There is diffuse irregular wall thickening about the bladder, with multiple bladder diverticula noted. The appearance raises concern for cystitis or chronic inflammation. The prostate remains normal in size. Other: A nonspecific 1.7 cm subcutaneous soft tissue nodule is noted at the left inguinal region. Underlying nodes are normal in size. Musculoskeletal: Diffuse heterogeneous sclerosis of the visualized osseous structures reflects the patient's myeloproliferative disorder. The visualized musculature is unremarkable in appearance. IMPRESSION: 1. Mild wall enhancement along the right renal pelvis and proximal right ureter, raising concern for right-sided pyelonephritis and ureteritis. No significant hydronephrosis seen. 2. Diffuse irregular wall thickening about the bladder, with multiple bladder  diverticula. The appearance raises concern for cystitis or chronic inflammation. 3. 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 4. Nonspecific 1.7 cm subcutaneous soft tissue nodule at the left inguinal region. Underlying lymph nodes are normal in size. 5. Right basilar airspace opacity likely reflects atelectasis. 6. Scattered coronary artery calcifications noted. 7. Scattered diverticulosis along the descending and sigmoid colon, without evidence of diverticulitis. 8. Diffuse heterogeneous sclerosis of the visualized osseous structures reflects the patient's myeloproliferative disorder. Aortic Atherosclerosis (ICD10-I70.0). Electronically Signed   By: Garald Balding M.D.   On: 04/30/2017 02:08   US Renal  Result Date: 05/06/2017 CLINICAL DATA:  Acute kidney injury superimposed upon chronic kidney disease, history hypertension, type II diabetes mellitus EXAM: RENAL / URINARY TRACT ULTRASOUND COMPLETE COMPARISON:  CT abdomen and pelvis 04/30/2017 FINDINGS: Right Kidney: Length: 10.8 cm. Normal cortical thickness. Increased cortical echogenicity. Cyst at inferior pole 3.0 x 2.7 x 2.2 cm with question minimal dependent debris. No additional mass, hydronephrosis or shadowing calcification Left Kidney: Length: 12.2 cm. Normal cortical thickness. Increased cortical echogenicity. Large echogenic focus 17 mm diameter at inferior pole with only minimal shadowing, uncertain etiology; appearance suggests a calculus but no calcification is seen on recent CT. No mass or hydronephrosis. Bladder: Multiple bladder diverticula. Dependent hypoechoic material within urinary bladder question debris. Scattered bladder wall thickening and trabeculation. Incidentally noted cholelithiasis. IMPRESSION: Cholelithiasis. Small RIGHT renal cyst. Medical renal disease changes of both kidneys. Multiple bladder diverticula, bladder wall thickening and trabeculation question chronic outlet obstruction. 17 mm  echogenic focus with minimal shadowing at inferior pole of LEFT kidney, uncertain etiology; sonographic appearance suggests potential kidney stone though no calculus is evident by CT. This could represent a noncalcified calculus, air within the collecting system if patient had recent instrumentation; no stent seen on prior CT, nor evidence of a fat containing mass on prior CT exam. Electronically Signed   By: Lavonia Dana M.D.   On: 05/06/2017 11:11   Ir Fluoro Guide Cv Line Right  Result Date: 05/07/2017 INDICATION: Poor venous access. In need of  durable intravenous access for medication administration. Given chronic renal insufficiency, request made for placement of a tunneled PICC line. EXAM: TUNNELED PICC LINE WITH ULTRASOUND AND FLUOROSCOPIC GUIDANCE MEDICATIONS: None. The antibiotic was given in an appropriate time interval prior to skin puncture. ANESTHESIA/SEDATION: None FLUOROSCOPY TIME:  12 seconds (2 mGy) COMPLICATIONS: None immediate. PROCEDURE: Informed written consent was obtained from the patient after a discussion of the risks, benefits, and alternatives to treatment. Questions regarding the procedure were encouraged and answered. The right neck and chest were prepped with chlorhexidine in a sterile fashion, and a sterile drape was applied covering the operative field. Maximum barrier sterile technique with sterile gowns and gloves were used for the procedure. A timeout was performed prior to the initiation of the procedure. After the overlying soft tissues were anesthetized, a small venotomy incision was created and a micropuncture kit was utilized to access the internal jugular vein. Real-time ultrasound guidance was utilized for vascular access including the acquisition of a permanent ultrasound image documenting patency of the accessed vessel. The microwire was utilized to measure appropriate catheter length. The micropuncture sheath was exchanged for a peel-away sheath over a guidewire. A 5  French dual lumen tunneled PICC measuring 21 cm was tunneled in a retrograde fashion from the anterior chest wall to the venotomy incision. The catheter was then placed through the peel-away sheath with tip ultimately positioned at the superior caval-atrial junction. Final catheter positioning was confirmed and documented with a spot radiographic image. The catheter aspirates and flushes normally. The catheter was flushed with appropriate volume heparin dwells. The catheter exit site was secured with a 0-Prolene retention suture. The venotomy incision was closed with Dermabond and Steri-strips. Dressings were applied. The patient tolerated the procedure well without immediate post procedural complication. FINDINGS: After catheter placement, the tip lies within the superior cavoatrial junction. The catheter aspirates and flushes normally and is ready for immediate use. IMPRESSION: Successful placement of 21 cm dual lumen tunneled PICC catheter via the right internal jugular vein with tip terminating at the superior caval atrial junction. The catheter is ready for immediate use. Electronically Signed   By: Sandi Mariscal M.D.   On: 05/07/2017 11:25   Ir US Guide Vasc Access Right  Result Date: 05/07/2017 INDICATION: Poor venous access. In need of durable intravenous access for medication administration. Given chronic renal insufficiency, request made for placement of a tunneled PICC line. EXAM: TUNNELED PICC LINE WITH ULTRASOUND AND FLUOROSCOPIC GUIDANCE MEDICATIONS: None. The antibiotic was given in an appropriate time interval prior to skin puncture. ANESTHESIA/SEDATION: None FLUOROSCOPY TIME:  12 seconds (2 mGy) COMPLICATIONS: None immediate. PROCEDURE: Informed written consent was obtained from the patient after a discussion of the risks, benefits, and alternatives to treatment. Questions regarding the procedure were encouraged and answered. The right neck and chest were prepped with chlorhexidine in a sterile  fashion, and a sterile drape was applied covering the operative field. Maximum barrier sterile technique with sterile gowns and gloves were used for the procedure. A timeout was performed prior to the initiation of the procedure. After the overlying soft tissues were anesthetized, a small venotomy incision was created and a micropuncture kit was utilized to access the internal jugular vein. Real-time ultrasound guidance was utilized for vascular access including the acquisition of a permanent ultrasound image documenting patency of the accessed vessel. The microwire was utilized to measure appropriate catheter length. The micropuncture sheath was exchanged for a peel-away sheath over a guidewire. A 5 French dual lumen tunneled  PICC measuring 21 cm was tunneled in a retrograde fashion from the anterior chest wall to the venotomy incision. The catheter was then placed through the peel-away sheath with tip ultimately positioned at the superior caval-atrial junction. Final catheter positioning was confirmed and documented with a spot radiographic image. The catheter aspirates and flushes normally. The catheter was flushed with appropriate volume heparin dwells. The catheter exit site was secured with a 0-Prolene retention suture. The venotomy incision was closed with Dermabond and Steri-strips. Dressings were applied. The patient tolerated the procedure well without immediate post procedural complication. FINDINGS: After catheter placement, the tip lies within the superior cavoatrial junction. The catheter aspirates and flushes normally and is ready for immediate use. IMPRESSION: Successful placement of 21 cm dual lumen tunneled PICC catheter via the right internal jugular vein with tip terminating at the superior caval atrial junction. The catheter is ready for immediate use. Electronically Signed   By: Sandi Mariscal M.D.   On: 05/07/2017 11:25    Lab Data:  CBC: Recent Labs  Lab 05/02/17 0622 05/04/17 0501  05/05/17 0405 05/06/17 0626 05/07/17 0518  WBC 14.1* 19.6* 21.7* 28.7* 27.6*  NEUTROABS  --   --  18.9* 19.8* 15.2*  HGB 8.9* 9.3* 9.2* 10.3* 10.2*  HCT 30.0* 31.3* 30.6* 34.6* 34.4*  MCV 84.7 83.9 83.6 83.2 85.1  PLT 344 402* 443* 541* 250*   Basic Metabolic Panel: Recent Labs  Lab 05/03/17 0507 05/04/17 0501 05/05/17 0405 05/06/17 0626 05/07/17 0518  NA 141 138 138 138 140  K 4.4 4.9 4.4 4.2 4.5  CL 110 107 106 108 111  CO2 _0 21*  GLUCOSE 85 149* 122* 115* 84  BUN 34* 41* 49* 48* 46*  CREATININE 1.38* 1.68* 1.82* 1.67* 1.45*  CALCIUM 9.3 9.1 9.0 9.2 9.4  MG  --   --  2.3 2.4 2.6*  PHOS  --   --  3.4 3.2 3.5   GFR: Estimated Creatinine Clearance: 52 mL/min (A) (by C-G formula based on SCr of 1.45 mg/dL (H)). Liver Function Tests: Recent Labs  Lab 05/05/17 0405 05/06/17 0626 05/07/17 0518  AST 21 24 33  ALT 11* 15* 25  ALKPHOS 85 96 92  BILITOT 0.4 0.4 0.5  PROT 5.0* 5.4* 5.4*  ALBUMIN 2.2* 2.5* 2.6*   No results for input(s): LIPASE, AMYLASE in the last 168 hours. No results for input(s): AMMONIA in the last 168 hours. Coagulation Profile: No results for input(s): INR, PROTIME in the last 168 hours. Cardiac Enzymes: No results for input(s): CKTOTAL, CKMB, CKMBINDEX, TROPONINI in the last 168 hours. BNP (last 3 results) No results for input(s): PROBNP in the last 8760 hours. HbA1C: No results for input(s): HGBA1C in the last 72 hours. CBG: No results for input(s): GLUCAP in the last 168 hours. Lipid Profile: No results for input(s): CHOL, HDL, LDLCALC, TRIG, CHOLHDL, LDLDIRECT in the last 72 hours. Thyroid Function Tests: No results for input(s): TSH, T4TOTAL, FREET4, T3FREE, THYROIDAB in the last 72 hours. Anemia Panel: No results for input(s): VITAMINB12, FOLATE, FERRITIN, TIBC, IRON, RETICCTPCT in the last 72 hours. Urine analysis:    Component Value Date/Time   COLORURINE YELLOW 04/29/2017 1915   APPEARANCEUR CLOUDY (A) 04/29/2017 1915     LABSPEC 1.012 04/29/2017 1915   PHURINE 6.0 04/29/2017 1915   GLUCOSEU NEGATIVE 04/29/2017 1915   GLUCOSEU NEGATIVE 05/02/2015 1008   HGBUR SMALL (A) 04/29/2017 [place] 04/29/2017 [place] 04/29/2017 1915  PROTEINUR >=300 (A) 04/29/2017 1915   UROBILINOGEN 0.2 05/02/2015 1008   NITRITE POSITIVE (A) 04/29/2017 1915   LEUKOCYTESUR LARGE (A) 04/29/2017 1915     Juan [name] M.D. [facility] 05/07/2017, 2:02 PM  Pager: [phone] Between 7am to 7pm - call Pager - [phone]  After 7pm go to [email or web address] - password [email or web address]  Call night coverage person covering after 7pm

## 2017-05-07 NOTE — Procedures (Signed)
Pre procedural Diagnosis: Poor venous access Post Procedural Diagnosis: Same  Successful placement of R IJ approach 21 cm dual lumen tunneled PICC line with tip at the superior caval-atrial junction.    EBL: None  No immediate post procedural complication.  The PICC line is ready for immediate use.  Ronny Bacon, MD Pager #: [phone]

## 2017-05-07 NOTE — Progress Notes (Signed)
PHARMACY CONSULT NOTE FOR:  OUTPATIENT  PARENTERAL ANTIBIOTIC THERAPY (OPAT)  Indication: Pseudomonas pyelonephritis Regimen: Cefepime 2g IV every 24 hours End date: 05/15/17  IV antibiotic discharge orders are pended. To discharging provider:  please sign these orders via discharge navigator,  Select New Orders & click on the button choice - Manage This Unsigned Work.     Thank you for allowing pharmacy to be a part of this patient's care.  Brain Hilts 05/07/2017, 9:13 AM

## 2017-05-07 NOTE — Progress Notes (Signed)
PT Cancellation Note  Patient Details Name: Juan [name] MRN: [id number] DOB: 07/24/1940   Cancelled Treatment:    Reason Eval/Treat Not Completed: Patient declined, no reason specified Patient declines PT this morning, stating he would like to wait until this afternoon as he is supposed to be getting a PICC line. Patient reports he will be agreeable to PT this afternoon. Will attempt to return if time allows.    Deniece Ree PT, DPT, CBIS  Supplemental Physical Therapist [facility]

## 2017-05-07 NOTE — Progress Notes (Signed)
Patient ID: Juan [name], male   DOB: [birth year], 76 y.o.   MRN: [id number]   Pre procedural Diagnosis: Poor venous access Post Procedural Diagnosis: Same  Successful placement of R IJ approach 21 cm dual lumen tunneled PICC line with tip at the superior caval-atrial junction.   RN calls to report catheter still bleeding from site Asked her to sit pt upright  evaluation at bedside Oozing from catheter insertion site is evident Held manual pressure for 10-15 minutes  Gelfoam pressure dressing placed Rechecked in 15 minutes still oozing although minimally  IR to bring pt back to room for re evaluation

## 2017-05-08 ENCOUNTER — Inpatient Hospital Stay (HOSPITAL_COMMUNITY): Payer: Medicare Other

## 2017-05-08 DIAGNOSIS — I5032 Chronic diastolic (congestive) heart failure: Secondary | ICD-10-CM | POA: Diagnosis not present

## 2017-05-08 DIAGNOSIS — N183 Chronic kidney disease, stage 3 (moderate): Secondary | ICD-10-CM | POA: Diagnosis not present

## 2017-05-08 DIAGNOSIS — D649 Anemia, unspecified: Secondary | ICD-10-CM | POA: Diagnosis not present

## 2017-05-08 DIAGNOSIS — D638 Anemia in other chronic diseases classified elsewhere: Secondary | ICD-10-CM | POA: Diagnosis not present

## 2017-05-08 DIAGNOSIS — R600 Localized edema: Secondary | ICD-10-CM | POA: Diagnosis not present

## 2017-05-08 DIAGNOSIS — K2970 Gastritis, unspecified, without bleeding: Secondary | ICD-10-CM | POA: Diagnosis not present

## 2017-05-08 DIAGNOSIS — C946 Myelodysplastic disease, not classified: Secondary | ICD-10-CM | POA: Diagnosis present

## 2017-05-08 DIAGNOSIS — I471 Supraventricular tachycardia: Secondary | ICD-10-CM | POA: Diagnosis not present

## 2017-05-08 DIAGNOSIS — R110 Nausea: Secondary | ICD-10-CM | POA: Diagnosis not present

## 2017-05-08 DIAGNOSIS — N12 Tubulo-interstitial nephritis, not specified as acute or chronic: Secondary | ICD-10-CM | POA: Diagnosis not present

## 2017-05-08 DIAGNOSIS — R2689 Other abnormalities of gait and mobility: Secondary | ICD-10-CM | POA: Diagnosis not present

## 2017-05-08 DIAGNOSIS — N179 Acute kidney failure, unspecified: Secondary | ICD-10-CM | POA: Diagnosis not present

## 2017-05-08 DIAGNOSIS — E119 Type 2 diabetes mellitus without complications: Secondary | ICD-10-CM | POA: Diagnosis not present

## 2017-05-08 DIAGNOSIS — R0902 Hypoxemia: Secondary | ICD-10-CM | POA: Diagnosis present

## 2017-05-08 DIAGNOSIS — Z7982 Long term (current) use of aspirin: Secondary | ICD-10-CM | POA: Diagnosis not present

## 2017-05-08 DIAGNOSIS — N401 Enlarged prostate with lower urinary tract symptoms: Secondary | ICD-10-CM | POA: Diagnosis not present

## 2017-05-08 DIAGNOSIS — R531 Weakness: Secondary | ICD-10-CM | POA: Diagnosis not present

## 2017-05-08 DIAGNOSIS — D72829 Elevated white blood cell count, unspecified: Secondary | ICD-10-CM | POA: Diagnosis not present

## 2017-05-08 DIAGNOSIS — N1330 Unspecified hydronephrosis: Secondary | ICD-10-CM | POA: Diagnosis present

## 2017-05-08 DIAGNOSIS — N189 Chronic kidney disease, unspecified: Secondary | ICD-10-CM | POA: Diagnosis not present

## 2017-05-08 DIAGNOSIS — M6281 Muscle weakness (generalized): Secondary | ICD-10-CM | POA: Diagnosis not present

## 2017-05-08 DIAGNOSIS — R338 Other retention of urine: Secondary | ICD-10-CM | POA: Diagnosis not present

## 2017-05-08 DIAGNOSIS — I129 Hypertensive chronic kidney disease with stage 1 through stage 4 chronic kidney disease, or unspecified chronic kidney disease: Secondary | ICD-10-CM | POA: Diagnosis not present

## 2017-05-08 DIAGNOSIS — D473 Essential (hemorrhagic) thrombocythemia: Secondary | ICD-10-CM | POA: Diagnosis not present

## 2017-05-08 DIAGNOSIS — Z79899 Other long term (current) drug therapy: Secondary | ICD-10-CM | POA: Diagnosis not present

## 2017-05-08 DIAGNOSIS — R278 Other lack of coordination: Secondary | ICD-10-CM | POA: Diagnosis not present

## 2017-05-08 DIAGNOSIS — K5730 Diverticulosis of large intestine without perforation or abscess without bleeding: Secondary | ICD-10-CM | POA: Diagnosis not present

## 2017-05-08 DIAGNOSIS — I130 Hypertensive heart and chronic kidney disease with heart failure and stage 1 through stage 4 chronic kidney disease, or unspecified chronic kidney disease: Secondary | ICD-10-CM | POA: Diagnosis present

## 2017-05-08 DIAGNOSIS — N390 Urinary tract infection, site not specified: Secondary | ICD-10-CM | POA: Diagnosis not present

## 2017-05-08 DIAGNOSIS — J80 Acute respiratory distress syndrome: Secondary | ICD-10-CM | POA: Diagnosis not present

## 2017-05-08 DIAGNOSIS — I10 Essential (primary) hypertension: Secondary | ICD-10-CM | POA: Diagnosis not present

## 2017-05-08 DIAGNOSIS — I110 Hypertensive heart disease with heart failure: Secondary | ICD-10-CM | POA: Diagnosis not present

## 2017-05-08 DIAGNOSIS — Z87891 Personal history of nicotine dependence: Secondary | ICD-10-CM | POA: Diagnosis not present

## 2017-05-08 DIAGNOSIS — K5731 Diverticulosis of large intestine without perforation or abscess with bleeding: Secondary | ICD-10-CM | POA: Diagnosis present

## 2017-05-08 DIAGNOSIS — M7989 Other specified soft tissue disorders: Secondary | ICD-10-CM

## 2017-05-08 DIAGNOSIS — N3289 Other specified disorders of bladder: Secondary | ICD-10-CM | POA: Diagnosis not present

## 2017-05-08 DIAGNOSIS — D62 Acute posthemorrhagic anemia: Secondary | ICD-10-CM | POA: Diagnosis not present

## 2017-05-08 DIAGNOSIS — I272 Pulmonary hypertension, unspecified: Secondary | ICD-10-CM | POA: Diagnosis present

## 2017-05-08 DIAGNOSIS — K922 Gastrointestinal hemorrhage, unspecified: Secondary | ICD-10-CM | POA: Diagnosis not present

## 2017-05-08 DIAGNOSIS — K625 Hemorrhage of anus and rectum: Secondary | ICD-10-CM | POA: Diagnosis not present

## 2017-05-08 DIAGNOSIS — R109 Unspecified abdominal pain: Secondary | ICD-10-CM | POA: Diagnosis not present

## 2017-05-08 DIAGNOSIS — M10041 Idiopathic gout, right hand: Secondary | ICD-10-CM | POA: Diagnosis not present

## 2017-05-08 DIAGNOSIS — R41841 Cognitive communication deficit: Secondary | ICD-10-CM | POA: Diagnosis not present

## 2017-05-08 DIAGNOSIS — Z9981 Dependence on supplemental oxygen: Secondary | ICD-10-CM | POA: Diagnosis not present

## 2017-05-08 DIAGNOSIS — I2510 Atherosclerotic heart disease of native coronary artery without angina pectoris: Secondary | ICD-10-CM | POA: Diagnosis present

## 2017-05-08 DIAGNOSIS — B965 Pseudomonas (aeruginosa) (mallei) (pseudomallei) as the cause of diseases classified elsewhere: Secondary | ICD-10-CM | POA: Diagnosis not present

## 2017-05-08 DIAGNOSIS — D500 Iron deficiency anemia secondary to blood loss (chronic): Secondary | ICD-10-CM | POA: Diagnosis not present

## 2017-05-08 DIAGNOSIS — G4733 Obstructive sleep apnea (adult) (pediatric): Secondary | ICD-10-CM | POA: Diagnosis present

## 2017-05-08 DIAGNOSIS — D471 Chronic myeloproliferative disease: Secondary | ICD-10-CM | POA: Diagnosis not present

## 2017-05-08 DIAGNOSIS — E876 Hypokalemia: Secondary | ICD-10-CM | POA: Diagnosis not present

## 2017-05-08 DIAGNOSIS — K5790 Diverticulosis of intestine, part unspecified, without perforation or abscess without bleeding: Secondary | ICD-10-CM | POA: Diagnosis not present

## 2017-05-08 DIAGNOSIS — E1122 Type 2 diabetes mellitus with diabetic chronic kidney disease: Secondary | ICD-10-CM | POA: Diagnosis present

## 2017-05-08 LAB — BASIC METABOLIC PANEL
Anion gap: 7 (ref 5–15)
BUN: 45 mg/dL — AB (ref 6–20)
CO2: 22 mmol/L (ref 22–32)
CREATININE: 1.41 mg/dL — AB (ref 0.61–1.24)
Calcium: 9.4 mg/dL (ref 8.9–10.3)
Chloride: 111 mmol/L (ref 101–111)
GFR calc Af Amer: 55 mL/min — ABNORMAL LOW (ref >60)
GFR, EST NON AFRICAN AMERICAN: 47 mL/min — AB (ref >60)
Glucose, Bld: 105 mg/dL — ABNORMAL HIGH (ref 65–99)
POTASSIUM: 4.9 mmol/L (ref 3.5–5.1)
SODIUM: 140 mmol/L (ref 135–145)

## 2017-05-08 LAB — CBC
HCT: 32.4 % — ABNORMAL LOW (ref 39.0–52.0)
Hemoglobin: 9.6 g/dL — ABNORMAL LOW (ref 13.0–17.0)
MCH: 25.1 pg — AB (ref 26.0–34.0)
MCHC: 29.6 g/dL — ABNORMAL LOW (ref 30.0–36.0)
MCV: 84.6 fL (ref 78.0–100.0)
PLATELETS: 536 10*3/uL — AB (ref 150–400)
RBC: 3.83 MIL/uL — AB (ref 4.22–5.81)
RDW: 19.2 % — ABNORMAL HIGH (ref 11.5–15.5)
WBC: 23.4 10*3/uL — ABNORMAL HIGH (ref 4.0–10.5)

## 2017-05-08 MED ORDER — HEPARIN SOD (PORK) LOCK FLUSH 100 UNIT/ML IV SOLN
250.0000 [IU] | INTRAVENOUS | Status: AC | PRN
  Administered 2017-05-08: 250 [IU]

## 2017-05-08 MED ORDER — HYDROXYZINE HCL 10 MG PO TABS: 10.0000 mg | ORAL_TABLET | ORAL | 0 refills | Status: DC | PRN

## 2017-05-08 MED ORDER — ACETAMINOPHEN 325 MG PO TABS: 650.0000 mg | ORAL_TABLET | Freq: Four times a day (QID) | ORAL | Status: AC | PRN

## 2017-05-08 MED ORDER — DICLOFENAC SODIUM 1 % TD GEL: 4.0000 g | Freq: Four times a day (QID) | TRANSDERMAL | 0 refills | Status: DC

## 2017-05-08 MED ORDER — GABAPENTIN 100 MG PO CAPS: 200.0000 mg | ORAL_CAPSULE | Freq: Three times a day (TID) | ORAL | 0 refills | Status: DC | PRN

## 2017-05-08 MED ORDER — HYDROCERIN EX CREA: TOPICAL_CREAM | CUTANEOUS | 0 refills | Status: DC

## 2017-05-08 MED ORDER — CEFEPIME IV (FOR PTA / DISCHARGE USE ONLY): 2.0000 g | INTRAVENOUS | 0 refills | Status: AC

## 2017-05-08 MED ORDER — GLUCERNA SHAKE PO LIQD: 118.5000 mL | Freq: Every day | ORAL | 0 refills | Status: DC

## 2017-05-08 MED ORDER — HEPARIN SOD (PORK) LOCK FLUSH 100 UNIT/ML IV SOLN: 250.0000 [IU] | INTRAVENOUS | Status: DC | PRN

## 2017-05-08 MED ORDER — COLCHICINE 0.6 MG PO TABS: 0.6000 mg | ORAL_TABLET | Freq: Every day | ORAL | Status: DC

## 2017-05-08 NOTE — Discharge Summary (Signed)
Physician Discharge Summary   Patient ID: Juan [name] MRN: [id number] DOB/AGE: February 04, 1941 76 y.o.  Admit date: 04/29/2017 Discharge date: 05/08/2017  Primary Care Physician:  Colon Branch, MD  Discharge Diagnoses:    . Acute Pseudomonas lower UTI . Essential hypertension . Right-sided pyelonephritis . Pulmonary hypertension severe . Chronic diastolic CHF (congestive heart failure) ([place]) . Acute gout right hand Mild acute on chronic kidney disease stage III,GFR 30-59 ml/min [facility])   Consults:   Interventional radiology  Recommendations for Outpatient Follow-up:  1. Continue IV cefepime, last dose on 05/15/17.   2. Pull the PICC line after IV antibiotics are completed 3. Please repeat CBC/BMET at next visit  DIET: Heart healthy diet    Allergies:  No Known Allergies   DISCHARGE MEDICATIONS: Allergies as of 05/08/2017   No Known Allergies     Medication List    TAKE these medications   acetaminophen 325 MG tablet Commonly known as:  TYLENOL Take 2 tablets (650 mg total) by mouth every 6 (six) hours as needed for mild pain (or Fever >/= 101).   aspirin 325 MG tablet Take 325 mg by mouth daily.   b complex vitamins tablet Take 1 tablet by mouth daily.   ceFEPime IVPB Commonly known as:  MAXIPIME Inject 2 g into the vein daily for 8 days. Indication:  Pseudomonas pyelonephritis Last Day of Therapy:  05/15/17 Labs - Once weekly:  CBC/D and BMP, Labs - Every other week:  ESR and CRP   colchicine 0.6 MG tablet Take 1 tablet (0.6 mg total) by mouth daily.   diclofenac sodium 1 % Gel Commonly known as:  VOLTAREN Apply 4 g topically 4 (four) times daily. Apply to bilateral  knees   feeding supplement (GLUCERNA SHAKE) Liqd Take 119 mLs by mouth daily. Reported on 08/21/2015   ferrous gluconate 240 (27 FE) MG tablet Commonly known as:  FERGON Take 240 mg by mouth daily.   fluticasone 50 MCG/ACT nasal spray Commonly known as:  FLONASE Place 2 sprays into both  nostrils daily. Reported on 05/02/2015   furosemide 40 MG tablet Commonly known as:  LASIX Take 40 mg by mouth daily.   gabapentin 100 MG capsule Commonly known as:  NEURONTIN Take 2 capsules (200 mg total) by mouth 3 (three) times daily as needed (pain). Reported on 08/22/2015   hydrALAZINE 25 MG tablet Commonly known as:  APRESOLINE Take 1 tablet (25 mg total) by mouth 3 (three) times daily.   hydrocerin Crea Apply Eucerin cream to BLE Q day after bathing and roughly towel drying to remove loose skin   hydrOXYzine 10 MG tablet Commonly known as:  ATARAX/VISTARIL Take 1 tablet (10 mg total) by mouth every 4 (four) hours as needed for itching.   OXYGEN Inhale 2 L into the lungs continuous.   polyethylene glycol packet Commonly known as:  MIRALAX / GLYCOLAX Take 17 g by mouth daily as needed for mild constipation.   tamsulosin 0.4 MG Caps capsule Commonly known as:  FLOMAX Take 0.4 mg by mouth.            Home Infusion Instuctions  (From admission, onward)        Start     Ordered   05/08/17 0000  Home infusion instructions [facility] May follow [place] Dosing Protocol; May administer Cathflo as needed to maintain patency of vascular access device.; Flushing of vascular access device: per [facility] Protocol: 0.9% NaCl pre/post medica...    Question Answer Comment  Instructions May follow [place] Dosing Protocol   Instructions May administer Cathflo as needed to maintain patency of vascular access device.   Instructions Flushing of vascular access device: per [facility] Protocol: 0.9% NaCl pre/post medication administration and prn patency; Heparin 100 u/ml, 59m for implanted ports and Heparin 10u/ml, 544mfor all other central venous catheters.   Instructions May follow [facility] Anaphylaxis Protocol for First Dose Administration in the home: 0.9% NaCl at 25-50 ml/hr to maintain IV access for protocol meds. Epinephrine 0.3 ml IV/IM PRN and Benadryl 25-50 IV/IM PRN s/s of  anaphylaxis.   Instructions [facility] Infusion Coordinator (RN) to assist per patient IV care needs in the home PRN.      05/08/17 0813       Brief H and P: For complete details please refer to admission H and P, but in brief Juan [name] [id number].malewith medical history significant ofchronic debility, myelo proliferative disorder, CHF, severe pulm HTN on 2L O2 at baseline, CKD stage 3 and other comorbids. Patient presents to the ED with c/o worsening dysuria. Patient was diagnosed with E.Coli UTI on 11/26. Treated with course of Keflex which it was sensitive to. Initially symptoms improved some but not entirely, but after finishing course returned and got worse. He reported dysuria, urinary frequency and urgency, lower abdominal pain. Admitted for Pseudomonas UTI and Right sided Pyelonephritis. Hospitalization has been complicated by Gout and an AKI on CKD.   Hospital Course:  Acute Pseudomonas lower UTI, right-sided pyelonephritis  -CT abdomen showed right-sided pyelonephritis, urethritis, no significant hydronephrosis.  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 -PT evaluation recommended skilled nursing facility. -Tunneled PICC placed on 12/21.  Some bleeding from the placement, H&H stable.  PICC line will need to be pulled out after antibiotics are completed   Acute gout right hand -Improved -CRP 3.2, ESR 69, uric acid 8.7. -Continue allopurinol, colchicine, completed prednisone course    Essential hypertension -Currently stable, continue hydralazine    Chronic diastolic CHF (congestive heart failure) (HCC) -Resume Lasix from 12/23, follow creatinine function closely  Mild acute on CKD (chronic kidney disease) stage 3, GFR 30-59 ml/min  (HCC) -Baseline creatinine 1.2-1.3, improving -Plateaued at 1.8 now improving to 1.41.  Lasix was held, now increasing peripheral edema hence can be resumed from 12/23.  Follow creatinine function closely. -Renal ultrasound showed cholelithiasis with medical renal disease of both kidneys, multiple bladder diverticula, bladder wall thickening and trabeculation questioning chronic outlet obstruction.  17 mm echogenic focus with minimal shadowing at the inferior pole of left kidney could represent noncalcified calculus  Anemia of chronic disease -Patient is stable, continue ferrous gluconate  Right leg pain, swelling -Right tib-fib x-ray showed no acute osseous injury to the right tibia and fibula.  Diffuse mild osteosclerosis as can be seen with myeloproliferative disease -Doppler ultrasound of the bilateral lower extremities did not show any DVT -Resume Lasix from tomorrow     Day of Discharge BP 134/66 (BP Location: Left Arm)   Pulse 79   Temp 98.2 F (36.8 C) (Oral)   Resp 17   Ht '6\' 3"'$  (1.905 m)   Wt 83.5 kg (184 lb)   SpO2 98%   BMI 23.00 kg/m   Physical Exam: General: Alert and awake oriented x3 not in any acute distress. HEENT: anicteric sclera, pupils reactive  to light and accommodation CVS: S1-S2 clear no murmur rubs or gallops Chest: clear to auscultation bilaterally, no wheezing rales or rhonchi Abdomen: soft nontender, nondistended, normal bowel sounds Extremities: no cyanosis, clubbing. 1+  edema noted bilaterally Neuro: Cranial nerves II-XII intact, no focal neurological deficits   The results of significant diagnostics from this hospitalization (including imaging, microbiology, ancillary and laboratory) are listed below for reference.    LAB RESULTS: Basic Metabolic Panel: Recent Labs  Lab 05/07/17 0518 05/08/17 0645  NA 140 140  K 4.5 4.9  CL 111 111  CO2 21* 22  GLUCOSE 84 105*  BUN 46* 45*  CREATININE 1.45* 1.41*  CALCIUM 9.4 9.4  MG 2.6*  --    PHOS 3.5  --    Liver Function Tests: Recent Labs  Lab 05/06/17 0626 05/07/17 0518  AST 24 33  ALT 15* 25  ALKPHOS 96 92  BILITOT 0.4 0.5  PROT 5.4* 5.4*  ALBUMIN 2.5* 2.6*   No results for input(s): LIPASE, AMYLASE in the last 168 hours. No results for input(s): AMMONIA in the last 168 hours. CBC: Recent Labs  Lab 05/07/17 0518 05/07/17 1405 05/08/17 0645  WBC 27.6*  --  23.4*  NEUTROABS 15.2*  --   --   HGB 10.2* 9.9* 9.6*  HCT 34.4* 32.7* 32.4*  MCV 85.1  --  84.6  PLT 581*  --  536*   Cardiac Enzymes: No results for input(s): CKTOTAL, CKMB, CKMBINDEX, TROPONINI in the last 168 hours. BNP: Invalid input(s): POCBNP CBG: No results for input(s): GLUCAP in the last 168 hours.  Significant Diagnostic Studies:  Ct Abdomen Pelvis W Contrast  Result Date: 04/30/2017 CLINICAL DATA:  Chronic severe groin pain, with infection.  Dysuria. EXAM: CT ABDOMEN AND PELVIS WITH CONTRAST TECHNIQUE: Multidetector CT imaging of the abdomen and pelvis was performed using the standard protocol following bolus administration of intravenous contrast. CONTRAST:  162m ISOVUE-300 IOPAMIDOL (ISOVUE-300) INJECTION 61% COMPARISON:  CT of the abdomen and pelvis performed 12/23/2016, and renal ultrasound performed 02/08/2017 FINDINGS: Lower chest: Right basilar airspace opacity likely reflects atelectasis. Scattered coronary artery calcifications are seen. Hepatobiliary: The liver is unremarkable in appearance. The gallbladder is unremarkable in appearance. The common bile duct remains normal in caliber. Pancreas:  The pancreas is within normal limits. Spleen: An apparent peripheral infarct is noted within the spleen. The spleen is enlarged, measuring 17.9 cm in length. Adrenals/Urinary Tract: The adrenal glands are unremarkable in appearance. Nonspecific perinephric stranding is noted bilaterally. A right renal cyst is noted. There is mild wall enhancement along the right renal pelvis and proximal  ureter, raising concern for right-sided pyelonephritis and ureteritis. No significant hydronephrosis is seen. No renal or ureteral stones are identified. Stomach/Bowel: The stomach is unremarkable in appearance. The small bowel is within normal limits. The appendix is normal in caliber, without evidence of appendicitis. Scattered diverticulosis is noted along the descending and sigmoid colon, without evidence of diverticulitis. Vascular/Lymphatic: Scattered calcification is seen along the abdominal aorta and its branches. The abdominal aorta is otherwise grossly unremarkable. The inferior vena cava is grossly unremarkable. No retroperitoneal lymphadenopathy is seen. No pelvic sidewall lymphadenopathy is identified. Reproductive: There is diffuse irregular wall thickening about the bladder, with multiple bladder diverticula noted. The appearance raises concern for cystitis or chronic inflammation. The prostate remains normal in size. Other: A nonspecific 1.7 cm subcutaneous soft tissue nodule is noted at the left inguinal region. Underlying nodes are normal in size. Musculoskeletal: Diffuse heterogeneous sclerosis of the visualized  osseous structures reflects the patient's myeloproliferative disorder. The visualized musculature is unremarkable in appearance. IMPRESSION: 1. Mild wall enhancement along the right renal pelvis and proximal right ureter, raising concern for right-sided pyelonephritis and ureteritis. No significant hydronephrosis seen. 2. Diffuse irregular wall thickening about the bladder, with multiple bladder diverticula. The appearance raises concern for cystitis or chronic inflammation. 3. 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 4. Nonspecific 1.7 cm subcutaneous soft tissue nodule at the left inguinal region. Underlying lymph nodes are normal in size. 5. Right basilar airspace opacity likely reflects atelectasis. 6. Scattered coronary artery calcifications noted. 7.  Scattered diverticulosis along the descending and sigmoid colon, without evidence of diverticulitis. 8. Diffuse heterogeneous sclerosis of the visualized osseous structures reflects the patient's myeloproliferative disorder. Aortic Atherosclerosis (ICD10-I70.0). Electronically Signed   By: Garald Balding M.D.   On: 04/30/2017 02:08    2D ECHO:   Disposition and Follow-up: Discharge Instructions    Diet - low sodium heart healthy   Complete by:  As directed    Home infusion instructions [facility] May follow [place] Dosing Protocol; May administer Cathflo as needed to maintain patency of vascular access device.; Flushing of vascular access device: per [facility] Protocol: 0.9% NaCl pre/post medica...   Complete by:  As directed    Instructions:  May follow [place] Dosing Protocol   Instructions:  May administer Cathflo as needed to maintain patency of vascular access device.   Instructions:  Flushing of vascular access device: per [facility] Protocol: 0.9% NaCl pre/post medication administration and prn patency; Heparin 100 u/ml, 37m for implanted ports and Heparin 10u/ml, 548mfor all other central venous catheters.   Instructions:  May follow [facility] Anaphylaxis Protocol for First Dose Administration in the home: 0.9% NaCl at 25-50 ml/hr to maintain IV access for protocol meds. Epinephrine 0.3 ml IV/IM PRN and Benadryl 25-50 IV/IM PRN s/s of anaphylaxis.   Instructions:  [facility] Coordinator (RN) to assist per patient IV care needs in the home PRN.   Increase activity slowly   Complete by:  As directed        DISPOSITION: SNF   DISCHARGE FOLLOW-UP  Contact information for follow-up providers    PaColon BranchMD. Schedule an appointment as soon as possible for a visit in 2 week(s).   Specialty:  Internal Medicine Contact information: [address] [phone]            Contact information for after-discharge care     Destination    [name] Follow up.   Service:  Skilled Nursing Contact information: [id number]                 Time spent on Discharge: 40MINS   Signed:   RiEstill Cotta.D. [facility] 05/08/2017, 10:10 AM Pager: [phone]

## 2017-05-08 NOTE — Care Management Note (Signed)
Case Management Note  Patient Details  Name: Rasmus Preusser MRN: [id number] Date of Birth: 06/10/1940  Subjective/Objective:         DC to SNF as facilitated by CSW.            Action/Plan:   Expected Discharge Date:  05/08/17               Expected Discharge Plan:  [place]  In-House Referral:     Discharge planning Services  CM Consult  Post Acute Care Choice:    Choice offered to:  Patient  DME Arranged:    DME Agency:     HH Arranged:    [place] Agency:     Status of Service:  Completed, signed off  If discussed at [employer], dates discussed:    Additional Comments:  Carles Collet, RN 05/08/2017, 9:11 AM

## 2017-05-08 NOTE — Progress Notes (Signed)
Pt legs are swollen and pt.likes to sleep on the recliner chair. Refuse to elevate his legs and refuse to change socks to fitted size

## 2017-05-08 NOTE — Progress Notes (Signed)
Clinical Social Worker facilitated patient discharge including contacting patient family and facility to confirm patient discharge plans.  Clinical information faxed to facility and family agreeable with plan.  CSW arranged ambulance transport via [facility] to [facility] .  RN to call [phone] for report prior to discharge.  Clinical Social Worker will sign off for now as social work intervention is no longer needed. Please consult [place] again if new need arises.  Rhea Pink, MSW, [place]

## 2017-05-08 NOTE — Progress Notes (Signed)
VASCULAR LAB PRELIMINARY  PRELIMINARY  PRELIMINARY  PRELIMINARY  Bilateral lower extremity venous duplex completed.    Preliminary report:  There is no DVT or SVT noted in the bilateral lower extremities.   Etana Beets, RVT 05/08/2017, 9:38 AM

## 2017-05-09 ENCOUNTER — Telehealth: Payer: Self-pay | Admitting: Internal Medicine

## 2017-05-09 NOTE — Telephone Encounter (Signed)
Advised patient, recommend TCM.

## 2017-05-10 ENCOUNTER — Telehealth: Payer: Self-pay

## 2017-05-10 ENCOUNTER — Encounter (HOSPITAL_COMMUNITY): Payer: Self-pay | Admitting: Interventional Radiology

## 2017-05-10 NOTE — Telephone Encounter (Signed)
thx

## 2017-05-10 NOTE — Telephone Encounter (Signed)
Noted  

## 2017-05-10 NOTE — Telephone Encounter (Signed)
[facility] follow Up call attempted. Phone number for patien does not have VM set up at this time. Called daughters phone Automated voice states not a working number.

## 2017-05-13 ENCOUNTER — Non-Acute Institutional Stay (SKILLED_NURSING_FACILITY): Payer: Medicare Other | Admitting: Internal Medicine

## 2017-05-13 ENCOUNTER — Encounter: Payer: Self-pay | Admitting: Internal Medicine

## 2017-05-13 DIAGNOSIS — I5032 Chronic diastolic (congestive) heart failure: Secondary | ICD-10-CM | POA: Diagnosis not present

## 2017-05-13 DIAGNOSIS — M10041 Idiopathic gout, right hand: Secondary | ICD-10-CM

## 2017-05-13 DIAGNOSIS — N179 Acute kidney failure, unspecified: Secondary | ICD-10-CM | POA: Diagnosis not present

## 2017-05-13 DIAGNOSIS — D638 Anemia in other chronic diseases classified elsewhere: Secondary | ICD-10-CM

## 2017-05-13 DIAGNOSIS — N401 Enlarged prostate with lower urinary tract symptoms: Secondary | ICD-10-CM | POA: Diagnosis not present

## 2017-05-13 DIAGNOSIS — I110 Hypertensive heart disease with heart failure: Secondary | ICD-10-CM

## 2017-05-13 DIAGNOSIS — N183 Chronic kidney disease, stage 3 (moderate): Secondary | ICD-10-CM

## 2017-05-13 DIAGNOSIS — R338 Other retention of urine: Secondary | ICD-10-CM | POA: Diagnosis not present

## 2017-05-13 DIAGNOSIS — N12 Tubulo-interstitial nephritis, not specified as acute or chronic: Secondary | ICD-10-CM | POA: Diagnosis not present

## 2017-05-13 DIAGNOSIS — B965 Pseudomonas (aeruginosa) (mallei) (pseudomallei) as the cause of diseases classified elsewhere: Secondary | ICD-10-CM

## 2017-05-13 DIAGNOSIS — N390 Urinary tract infection, site not specified: Secondary | ICD-10-CM

## 2017-05-13 NOTE — Progress Notes (Signed)
: Provider:  Noah Delaine. [name], MD Location:  Early Room Number: [place]:  SNF ([phone])  PCP: Colon Branch, MD Patient Care Team: Colon Branch, MD as PCP - General Roxana Hires, MD as Consulting Physician (Hematology and Oncology) [name], MD as Consulting Physician (Cardiology) [name], MD as Consulting Physician (Hematology and Oncology) Ardis Hughs, MD as Attending Physician (Urology)  Extended Emergency Contact Information Primary Emergency Contact: Tokarski,Carolyn Address: [place], [place] Mobile Phone: [phone] Relation: Daughter     Allergies: Patient has no known allergies.  Chief Complaint  Patient presents with  . New Admit To SNF    following hospitalization 04/29/17 to 05/08/17 acute Pseudomonas lower UTI    HPI: Patient is 76 y.o. male with chronic debility, myeloproliferative disorder, CHF, severe pulmonary hypertension on 2 L O2 at baseline, CK-MB stage III who presented to the ED with complaint of worsening dysuria. Patient had been diagnosed with Escherichia coli UTI on 11/26. Treated with course of Keflex to which it was sensitive. Initially symptoms improved but not entirely and after finishing course symptoms returned and got worse. Patient reported dysuria, urinary frequency, urgency and lower abdominal pain. UA in the ED was suggested of UTI. CT suggests the right-sided pyelonephritis. Patient was admitted to [facility] from 12/13-22 with his urine culture grew out greater than 100,000 colonies of Pseudomonas. Patient had been initially started on Zosyn which was changed to cefepime which will continue until 12/29. Patient had a PICC line placed on 12/21. Hospital course was complicated by an acute gout flare the right hand, and treated with an completed prednisone course, and mild acute on chronic kidney disease with creatinine rising  to 1.8 and  Improving prior to discharge. Patient is admitted to skilled nursing facility to continue IV antibiotics and for OT/PT. While at skilled nursing facility patient will be followed for hypertension treated with hydralazine and Lasix, congestive heart failure treated with Lasix, and BPH treated with Flomax.  Past Medical History:  Diagnosis Date  . Allergic rhinitis   . Anemia   . Ascending aortic aneurysm ([place]) 03/2014   4.3cm on CT scan  . CAD (coronary artery disease)    dx elsewheer in past, no documentation. Non-ischemic myoview 2007  . Chronic diastolic CHF (congestive heart failure), NYHA class 2 (HCC)    Normal EF w/ grade 1 dd by echo 12/2015  . Edema    R>L leg, u/s 5-12 neg for DVT  . Hemorrhoid   . History of thrombocytosis   . Hypertension   . Iron deficiency anemia due to chronic blood loss 03/31/2017  . 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 Migraine    "once/wk at least" (07/11/2013)  . Myeloproliferative disease ([place])   . On home oxygen therapy    "2L; all the time" (05/03/2017)  . Pyelonephritis 04/29/2017  . Shortness of breath   . Sinus congestion   . Sleep apnea, obstructive    at some point used CPAP, was d/c  years ago  . Type II diabetes mellitus ([place])     Past Surgical History:  Procedure Laterality Date  . IR FLUORO GUIDE CV LINE RIGHT  05/07/2017  . IR FLUORO RM 30-60 MIN  05/07/2017  . IR US GUIDE VASC ACCESS RIGHT  05/07/2017  . TOE SURGERY Right    "tried to straighten out  big toe" (07/11/2013)    Allergies as of 05/13/2017   No Known Allergies     Medication List        Accurate as of 05/13/17 11:50 AM. Always use your most recent med list.          acetaminophen 325 MG tablet Commonly known as:  TYLENOL Take 2 tablets (650 mg total) by mouth every 6 (six) hours as needed for mild pain (or Fever >/= 101).   aspirin 325 MG tablet Take 325 mg by mouth daily.   b complex vitamins tablet Take 1 tablet by mouth daily.     ceFEPime IVPB Commonly known as:  MAXIPIME Inject 2 g into the vein daily for 8 days. Indication:  Pseudomonas pyelonephritis Last Day of Therapy:  05/15/17 Labs - Once weekly:  CBC/D and BMP, Labs - Every other week:  ESR and CRP   colchicine 0.6 MG tablet Take 1 tablet (0.6 mg total) by mouth daily.   diclofenac sodium 1 % Gel Commonly known as:  VOLTAREN Apply 4 g topically 4 (four) times daily. Apply to bilateral  knees   ferrous gluconate 240 (27 FE) MG tablet Commonly known as:  FERGON Take 240 mg by mouth daily.   fluticasone 50 MCG/ACT nasal spray Commonly known as:  FLONASE Place 2 sprays into both nostrils daily. Reported on 05/02/2015   furosemide 40 MG tablet Commonly known as:  LASIX Take 40 mg by mouth daily.   gabapentin 100 MG capsule Commonly known as:  NEURONTIN Take 2 capsules (200 mg total) by mouth 3 (three) times daily as needed (pain). Reported on 08/22/2015   hydrALAZINE 25 MG tablet Commonly known as:  APRESOLINE Take 1 tablet (25 mg total) by mouth 3 (three) times daily.   hydrocerin Crea Apply Eucerin cream to BLE Q day after bathing and roughly towel drying to remove loose skin   hydrOXYzine 10 MG tablet Commonly known as:  ATARAX/VISTARIL Take 1 tablet (10 mg total) by mouth every 4 (four) hours as needed for itching.   OXYGEN Inhale 2 L into the lungs continuous.   polyethylene glycol packet Commonly known as:  MIRALAX / GLYCOLAX Take 17 g by mouth daily as needed for mild constipation.   tamsulosin 0.4 MG Caps capsule Commonly known as:  FLOMAX Take 0.4 mg by mouth.       No orders of the defined types were placed in this encounter.   Immunization History  Administered Date(s) Administered  . Influenza Split 02/25/2011  . Influenza Whole 03/17/2007, 04/25/2009  . Influenza, High Dose Seasonal PF 04/12/2013, 03/01/2017  . Influenza, Seasonal, Injecte, Preservative Fre 05/05/2012  . Influenza,inj,Quad PF,6+ Mos 01/26/2014,  01/22/2015, 04/04/2016  . PPD Test 04/19/2016  . Pneumococcal Conjugate-13 10/09/2014  . Pneumococcal Polysaccharide-23 01/14/2012, 03/31/2014  . Td 08/29/2009    Social History   Tobacco Use  . Smoking status: Former Smoker    Packs/day: 0.25    Years: 12.00    Pack years: 3.00    Types: Cigarettes    Last attempt to quit: 05/18/1966    Years since quitting: 51.0  . Smokeless tobacco: Never Used  . Tobacco comment: quit smoking 45 years ago  Substance Use Topics  . Alcohol use: No    Alcohol/week: 0.0 oz    Family history is   Family History  Problem Relation Age of Onset  . Schizophrenia Son   . Mental illness Daughter   . Colon cancer Neg Hx   .  Prostate cancer Neg Hx   . Heart attack Neg Hx   . Diabetes Neg Hx       Review of Systems  DATA OBTAINED: from patient, nurse GENERAL:  no fevers, fatigue, appetite changes SKIN: No itching, or rash EYES: No eye pain, redness, discharge EARS: No earache, tinnitus, change in hearing NOSE: No congestion, drainage or bleeding  MOUTH/THROAT: No mouth or tooth pain, No sore throat RESPIRATORY: No cough, wheezing, SOB CARDIAC: No chest pain, palpitations, lower extremity edema  GI: No abdominal pain, No N/V/D or constipation, No heartburn or reflux  GU: No dysuria, frequency or urgency, or incontinence; green urine  MUSCULOSKELETAL: No unrelieved bone/joint pain NEUROLOGIC: No headache, dizziness or focal weakness PSYCHIATRIC: No c/o anxiety or sadness   Vitals:   05/13/17 1136  BP: 132/75  Pulse: 69  Resp: 20  Temp: (!) 97.5 F (36.4 C)  SpO2: 98%    SpO2 Readings from Last 1 Encounters:  05/13/17 98%   Body mass index is 23 kg/m.     Physical Exam  GENERAL APPEARANCE: Alert, conversant,  No acute distress.  SKIN: No diaphoresis rash HEAD: Normocephalic, atraumatic  EYES: Conjunctiva/lids clear. Pupils round, reactive. EOMs intact.  EARS: External exam WNL, canals clear. Hearing grossly normal.    NOSE: No deformity or discharge.  MOUTH/THROAT: Lips w/o lesions  RESPIRATORY: Breathing is even, unlabored. Lung sounds are clear   CARDIOVASCULAR: Heart RRR no murmurs, rubs or gallops. 2+ peripheral edema.   GASTROINTESTINAL: Abdomen is soft, non-tender, not distended w/ normal bowel sounds. GENITOURINARY: Bladder non tender, not distended ; no flank pain MUSCULOSKELETAL: No abnormal joints or musculature NEUROLOGIC:  Cranial nerves 2-12 grossly intact. Moves all extremities  PSYCHIATRIC: Mood and affect appropriate to situation, no behavioral issues  Patient Active Problem List   Diagnosis Date Noted  . Pyelonephritis 04/30/2017  . Iron deficiency anemia due to chronic blood loss 03/31/2017  . 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 Diffuse pain 01/26/2017  . Infection due to urethral catheter ([place])   . Urinary retention 01/24/2017  . Right hip pain 12/24/2016  . 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 Muscle spasm 11/16/2016  . Neck pain 11/14/2016  . Elevated troponin 11/14/2016  . Arthritis   . Recurrent pneumonia 10/31/2016  . Acute extremity pain 10/31/2016  . 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 Pulmonary hypertension severe   . Sprain of left rotator cuff capsule   . Hyperkalemia   . Acute kidney injury superimposed on CKD ([place])   . Fever 10/08/2016  . Left arm pain 10/08/2016  . Paroxysmal SVT (supraventricular tachycardia) ([place]) 04/20/2016  . Type II diabetes mellitus ([place]) 04/20/2016  . Gout 04/20/2016  . Hyperuricemia 04/20/2016  . B12 deficiency 04/20/2016  . 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 Hydronephrosis   . Thrombocytosis ([place]) 04/07/2016  . Anemia 04/07/2016  . Pressure injury of skin 04/01/2016  . Pressure ulcer stage II 12/31/2015  . Sepsis ([place]) 12/29/2015  . Acute lower UTI 12/29/2015  . T wave inversion in EKG 12/29/2015  . PCP NOTES >>>>>>>>>>>>>>>>> 05/02/2015  . Arm pain   . Peripheral edema   . Unable to  ambulate   . Thoracic aortic aneurysm ([place]) 04/26/2014  . CKD (chronic kidney disease) stage 3, GFR 30-59 ml/min (HCC) 04/26/2014  . 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 Chest pain 04/15/2014  . Encounter for palliative care 04/06/2014  . Generalized weakness 04/06/2014  . Venous stasis dermatitis of both  lower extremities   . Malnutrition of moderate degree ([place]) 03/31/2014  . Morbid obesity due to excess calories ([place]) 03/29/2014  . 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 Poor compliance with advise  05/05/2012  . Annual physical exam 01/14/2012  . Weight loss 01/14/2012  . *Chronic lower extremity edema, stasis dermatitis 06/09/2011  . Symptomatic anemia 09/07/2006  . Myeloproliferative disease ([place]) 09/07/2006  . Obstructive sleep apnea 09/07/2006  . Essential hypertension 09/07/2006  . Coronary atherosclerosis 09/07/2006  . Chronic diastolic CHF (congestive heart failure) ([place]) 09/07/2006  . Allergic rhinitis 09/07/2006      Labs reviewed: Basic Metabolic Panel:    Component Value Date/Time   NA 140 05/08/2017 0645   NA 143 03/30/2017 1420   NA 138 11/16/2013 0847   K 4.9 05/08/2017 0645   K 4.3 03/30/2017 1420   K 4.5 11/16/2013 0847   CL 111 05/08/2017 0645   CL 110 (H) 03/30/2017 1420   CO2 22 05/08/2017 0645   CO2 27 03/30/2017 1420   CO2 22 11/16/2013 0847   GLUCOSE 105 (H) 05/08/2017 0645   GLUCOSE 106 03/30/2017 1420   BUN 45 (H) 05/08/2017 0645   BUN 38 (H) 03/30/2017 1420   BUN 20.2 11/16/2013 0847   CREATININE 1.41 (H) 05/08/2017 0645   CREATININE 1.3 (H) 03/30/2017 1420   CREATININE 1.2 11/16/2013 0847   CALCIUM 9.4 05/08/2017 0645   CALCIUM 9.8 03/30/2017 1420   CALCIUM 9.8 11/16/2013 0847   PROT 5.4 (L) 05/07/2017 0518   PROT 6.0 (L) 03/30/2017 1420   PROT 6.7 11/16/2013 0847   ALBUMIN 2.6 (L) 05/07/2017 0518   ALBUMIN 2.8 (L) 03/30/2017 1420   ALBUMIN 3.4 (L) 11/16/2013 0847   AST 33 05/07/2017 0518   AST 24  03/30/2017 1420   AST 25 11/16/2013 0847   ALT 25 05/07/2017 0518   ALT 22 03/30/2017 1420   ALT 11 11/16/2013 0847   ALKPHOS 92 05/07/2017 0518   ALKPHOS 116 (H) 03/30/2017 1420   ALKPHOS 125 11/16/2013 0847   BILITOT 0.5 05/07/2017 0518   BILITOT 0.40 03/30/2017 1420   BILITOT 0.35 11/16/2013 0847   GFRNONAA 47 (L) 05/08/2017 0645   GFRAA 55 (L) 05/08/2017 0645    Recent Labs    05/05/17 0405 05/06/17 0626 05/07/17 0518 05/08/17 0645  NA 138 138 140 140  K 4.4 4.2 4.5 4.9  CL 106 108 111 111  CO2 23 23 21* 22  GLUCOSE 122* 115* 84 105*  BUN 49* 48* 46* 45*  CREATININE 1.82* 1.67* 1.45* 1.41*  CALCIUM 9.0 9.2 9.4 9.4  MG 2.3 2.4 2.6*  --   PHOS 3.4 3.2 3.5  --    Liver Function Tests: Recent Labs    05/05/17 0405 05/06/17 0626 05/07/17 0518  AST 21 24 33  ALT 11* 15* 25  ALKPHOS 85 96 92  BILITOT 0.4 0.4 0.5  PROT 5.0* 5.4* 5.4*  ALBUMIN 2.2* 2.5* 2.6*   Recent Labs    04/29/17 1955  LIPASE 24   No results for input(s): AMMONIA in the last 8760 hours. CBC: Recent Labs    05/05/17 0405 05/06/17 0626 05/07/17 0518 05/07/17 1405 05/08/17 0645  WBC 21.7* 28.7* 27.6*  --  23.4*  NEUTROABS 18.9* 19.8* 15.2*  --   --   HGB 9.2* 10.3* 10.2* 9.9* 9.6*  HCT 30.6* 34.6* 34.4* 32.7* 32.4*  MCV 83.6 83.2 85.1  --  84.6  PLT 443* 541* 581*  --  536*  Lipid Recent Labs    11/14/16 0504  CHOL 176  HDL 59  LDLCALC 106*  TRIG 56    Cardiac Enzymes: Recent Labs    11/14/16 0504 11/14/16 1005 11/14/16 1607 12/20/16 0613  CKTOTAL  --   --   --  10*  TROPONINI 0.03* 0.03* 0.03*  --    BNP: Recent Labs    11/14/16 0504 12/26/16 1543 01/06/17 0931  BNP 186.6* 409.8* 435.8*   Lab Results  Component Value Date   MICROALBUR 0.5 05/31/2008   Lab Results  Component Value Date   HGBA1C 5.8 07/28/2016   Lab Results  Component Value Date   TSH 2.49 08/22/2015   Lab Results  Component Value Date   VITAMINB12 1,066 (H) 12/22/2016   Lab  Results  Component Value Date   FOLATE 56.7 04/09/2016   Lab Results  Component Value Date   IRON 14 (L) 03/30/2017   TIBC 179 (L) 03/30/2017   FERRITIN 434 (H) 03/30/2017    Imaging and Procedures obtained prior to SNF admission: Ct Abdomen Pelvis W Contrast  Result Date: 04/30/2017 CLINICAL DATA:  Chronic severe groin pain, with infection.  Dysuria. EXAM: CT ABDOMEN AND PELVIS WITH CONTRAST TECHNIQUE: Multidetector CT imaging of the abdomen and pelvis was performed using the standard protocol following bolus administration of intravenous contrast. CONTRAST:  184m ISOVUE-300 IOPAMIDOL (ISOVUE-300) INJECTION 61% COMPARISON:  CT of the abdomen and pelvis performed 12/23/2016, and renal ultrasound performed 02/08/2017 FINDINGS: Lower chest: Right basilar airspace opacity likely reflects atelectasis. Scattered coronary artery calcifications are seen. Hepatobiliary: The liver is unremarkable in appearance. The gallbladder is unremarkable in appearance. The common bile duct remains normal in caliber. Pancreas:  The pancreas is within normal limits. Spleen: An apparent peripheral infarct is noted within the spleen. The spleen is enlarged, measuring 17.9 cm in length. Adrenals/Urinary Tract: The adrenal glands are unremarkable in appearance. Nonspecific perinephric stranding is noted bilaterally. A right renal cyst is noted. There is mild wall enhancement along the right renal pelvis and proximal ureter, raising concern for right-sided pyelonephritis and ureteritis. No significant hydronephrosis is seen. No renal or ureteral stones are identified. Stomach/Bowel: The stomach is unremarkable in appearance. The small bowel is within normal limits. The appendix is normal in caliber, without evidence of appendicitis. Scattered diverticulosis is noted along the descending and sigmoid colon, without evidence of diverticulitis. Vascular/Lymphatic: Scattered calcification is seen along the abdominal aorta and its  branches. The abdominal aorta is otherwise grossly unremarkable. The inferior vena cava is grossly unremarkable. No retroperitoneal lymphadenopathy is seen. No pelvic sidewall lymphadenopathy is identified. Reproductive: There is diffuse irregular wall thickening about the bladder, with multiple bladder diverticula noted. The appearance raises concern for cystitis or chronic inflammation. The prostate remains normal in size. Other: A nonspecific 1.7 cm subcutaneous soft tissue nodule is noted at the left inguinal region. Underlying nodes are normal in size. Musculoskeletal: Diffuse heterogeneous sclerosis of the visualized osseous structures reflects the patient's myeloproliferative disorder. The visualized musculature is unremarkable in appearance. IMPRESSION: 1. Mild wall enhancement along the right renal pelvis and proximal right ureter, raising concern for right-sided pyelonephritis and ureteritis. No significant hydronephrosis seen. 2. Diffuse irregular wall thickening about the bladder, with multiple bladder diverticula. The appearance raises concern for cystitis or chronic inflammation. 3. 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 4. Nonspecific 1.7 cm subcutaneous soft tissue nodule at the left inguinal region. Underlying lymph nodes are normal in size. 5. Right basilar airspace  opacity likely reflects atelectasis. 6. Scattered coronary artery calcifications noted. 7. Scattered diverticulosis along the descending and sigmoid colon, without evidence of diverticulitis. 8. Diffuse heterogeneous sclerosis of the visualized osseous structures reflects the patient's myeloproliferative disorder. Aortic Atherosclerosis (ICD10-I70.0). Electronically Signed   By: Garald Balding M.D.   On: 04/30/2017 02:08     Not all labs, radiology exams or other studies done during hospitalization come through on my EPIC note; however they are reviewed by me.    Assessment and  Plan  PSEUDOMONAS UTI/RIGHT PYELONEPHRITIS-followed by CT abdomen, incidental finding of an acute infarct at the periphery of the spleen, splenomegaly; urine culture greater than 100,000 Pseudomonas; PICC line placed 12/21 SNF - admitted for continued IV antibiotics and for OT/PT; going through the daily notes it appeared the patient was started on cefepime on 12/20; patient's urine is still green-we'll opt to continue IV antibiotics until 05/18/2017.  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, MULTIPLE BLADDER DIVERTICULA BLADDER WALL THICKENING AND TRABECULATED, QUESTION CHRONIC OUTLET OBSTRUCTION SNF - will follow-up BMP  HYPERTENSION SNF - stable; continue hydralazine 25 mg by mouth 3 times a day  CHRONIC DIASTOLIC CONGESTIVE heart failure- SNF - continue Lasix 40 mg by mouth daily  BPH SNF - stable; continue Flomax 0.4 mg by mouth daily  ANEMIA OF CHRONIC DISEASE SNF - stable; continue Fergon 240 mg by mouth daily; will follow-up CBC    Time spent greater than 45 minutes;> 50% of time with patient was spent reviewing records, labs, tests and studies, counseling and developing plan of care  Webb Silversmith D. [name], MD

## 2017-05-14 ENCOUNTER — Non-Acute Institutional Stay (SKILLED_NURSING_FACILITY): Payer: Medicare Other | Admitting: Internal Medicine

## 2017-05-14 ENCOUNTER — Encounter: Payer: Self-pay | Admitting: Internal Medicine

## 2017-05-14 DIAGNOSIS — R600 Localized edema: Secondary | ICD-10-CM

## 2017-05-14 NOTE — Progress Notes (Signed)
Location:  [facility] Room Number: [place]:  SNF [phone])  Provider: Noah Delaine. [name], MD  Colon Branch, MD  Patient Care Team: Colon Branch, MD as PCP - General Roxana Hires, MD as Consulting Physician (Hematology and Oncology) [name], MD as Consulting Physician (Cardiology) [name], MD as Consulting Physician (Hematology and Oncology) Ardis Hughs, MD as Attending Physician (Urology)  Extended Emergency Contact Information Primary Emergency Contact: Kangas,Carolyn Address: [place], [place] Mobile Phone: [phone] Relation: Daughter    Allergies: Patient has no known allergies.  Chief Complaint  Patient presents with  . Acute Visit    increase lower extremities edema.    HPI: Patient is 76 y.o. male who nursing asked me to see for increased edema bilateral lower extremities noted over the past few days getting worse. Patient denies chest pain or shortness of breath. elevation does not help, nothing makes it worse.  Past Medical History:  Diagnosis Date  . Allergic rhinitis   . Anemia   . Ascending aortic aneurysm ([place]) 03/2014   4.3cm on CT scan  . CAD (coronary artery disease)    dx elsewheer in past, no documentation. Non-ischemic myoview 2007  . Chronic diastolic CHF (congestive heart failure), NYHA class 2 (HCC)    Normal EF w/ grade 1 dd by echo 12/2015  . Edema    R>L leg, u/s 5-12 neg for DVT  . Hemorrhoid   . History of thrombocytosis   . Hypertension   . Iron deficiency anemia due to chronic blood loss 03/31/2017  . 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 Migraine    "once/wk at least" (07/11/2013)  . Myeloproliferative disease ([place])   . On home oxygen therapy    "2L; all the time" (05/03/2017)  . Pyelonephritis 04/29/2017  . Shortness of breath   . Sinus congestion   . Sleep apnea, obstructive    at some point used CPAP, was d/c  years  ago  . Type II diabetes mellitus ([place])     Past Surgical History:  Procedure Laterality Date  . IR FLUORO GUIDE CV LINE RIGHT  05/07/2017  . IR FLUORO RM 30-60 MIN  05/07/2017  . IR US GUIDE VASC ACCESS RIGHT  05/07/2017  . TOE SURGERY Right    "tried to straighten out big toe" (07/11/2013)    Allergies as of 05/14/2017   No Known Allergies     Medication List        Accurate as of 05/14/17  4:39 PM. Always use your most recent med list.          acetaminophen 325 MG tablet Commonly known as:  TYLENOL Take 2 tablets (650 mg total) by mouth every 6 (six) hours as needed for mild pain (or Fever >/= 101).   aspirin 325 MG tablet Take 325 mg by mouth daily.   b complex vitamins tablet Take 1 tablet by mouth daily.   ceFEPime IVPB Commonly known as:  MAXIPIME Inject 2 g into the vein daily for 8 days. Indication:  Pseudomonas pyelonephritis Last Day of Therapy:  05/15/17 Labs - Once weekly:  CBC/D and BMP, Labs - Every other week:  ESR and CRP   colchicine 0.6 MG tablet Take 1 tablet (0.6 mg total) by mouth daily.   diclofenac sodium 1 % Gel Commonly known as:  VOLTAREN Apply 4 g topically 4 (  four) times daily. Apply to bilateral  knees   ferrous gluconate 240 (27 FE) MG tablet Commonly known as:  FERGON Take 240 mg by mouth daily.   fluticasone 50 MCG/ACT nasal spray Commonly known as:  FLONASE Place 2 sprays into both nostrils daily. Reported on 05/02/2015   furosemide 40 MG tablet Commonly known as:  LASIX Take 40 mg by mouth daily.   gabapentin 100 MG capsule Commonly known as:  NEURONTIN Take 2 capsules (200 mg total) by mouth 3 (three) times daily as needed (pain). Reported on 08/22/2015   hydrALAZINE 25 MG tablet Commonly known as:  APRESOLINE Take 1 tablet (25 mg total) by mouth 3 (three) times daily.   hydrocerin Crea Apply Eucerin cream to BLE Q day after bathing and roughly towel drying to remove loose skin   hydrOXYzine 10 MG  tablet Commonly known as:  ATARAX/VISTARIL Take 1 tablet (10 mg total) by mouth every 4 (four) hours as needed for itching.   OXYGEN Inhale 2 L into the lungs continuous.   polyethylene glycol packet Commonly known as:  MIRALAX / GLYCOLAX Take 17 g by mouth daily as needed for mild constipation.   tamsulosin 0.4 MG Caps capsule Commonly known as:  FLOMAX Take 0.4 mg by mouth.       No orders of the defined types were placed in this encounter.   Immunization History  Administered Date(s) Administered  . Influenza Split 02/25/2011  . Influenza Whole 03/17/2007, 04/25/2009  . Influenza, High Dose Seasonal PF 04/12/2013, 03/01/2017  . Influenza, Seasonal, Injecte, Preservative Fre 05/05/2012  . Influenza,inj,Quad PF,6+ Mos 01/26/2014, 01/22/2015, 04/04/2016  . PPD Test 04/19/2016  . Pneumococcal Conjugate-13 10/09/2014  . Pneumococcal Polysaccharide-23 01/14/2012, 03/31/2014  . Td 08/29/2009    Social History   Tobacco Use  . Smoking status: Former Smoker    Packs/day: 0.25    Years: 12.00    Pack years: 3.00    Types: Cigarettes    Last attempt to quit: 05/18/1966    Years since quitting: 51.0  . Smokeless tobacco: Never Used  . Tobacco comment: quit smoking 45 years ago  Substance Use Topics  . Alcohol use: No    Alcohol/week: 0.0 oz    Review of Systems  DATA OBTAINED: from patient, nurse-both as per history of present illness GENERAL:  no fevers, fatigue, appetite changes SKIN: No itching, rash HEENT: No complaint RESPIRATORY: No cough, wheezing, SOB CARDIAC: No chest pain, palpitations,+ lower extremity edema  GI: No abdominal pain, No N/V/D or constipation, No heartburn or reflux  GU: No dysuria, frequency or urgency, or incontinence  MUSCULOSKELETAL: No unrelieved bone/joint pain NEUROLOGIC: No headache, dizziness  PSYCHIATRIC: No overt anxiety or sadness  Vitals:   05/14/17 1636  BP: 130/72  Pulse: 88  Resp: 19  Temp: 98 F (36.7 C)   Body  mass index is 23 kg/m. Physical Exam  GENERAL APPEARANCE: Alert,  No acute distress  SKIN: No diaphoresis rash HEENT: Unremarkable RESPIRATORY: Breathing is even, unlabored. Lung sounds are clear   CARDIOVASCULAR: Heart RRR no murmurs, rubs or gallops. 2+ peripheral edema  GASTROINTESTINAL: Abdomen is soft, non-tender, not distended w/ normal bowel sounds.  GENITOURINARY: Bladder non tender, not distended  MUSCULOSKELETAL: No abnormal joints or musculature NEUROLOGIC: Cranial nerves 2-12 grossly intact. Moves all extremities PSYCHIATRIC: Mood and affect appropriate to situation, no behavioral issues  Patient Active Problem List   Diagnosis Date Noted  . Pyelonephritis 04/30/2017  . Iron deficiency anemia due to chronic  blood loss 03/31/2017  . 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 Diffuse pain 01/26/2017  . Infection due to urethral catheter ([place])   . Urinary retention 01/24/2017  . Right hip pain 12/24/2016  . 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 Muscle spasm 11/16/2016  . Neck pain 11/14/2016  . Elevated troponin 11/14/2016  . Arthritis   . Recurrent pneumonia 10/31/2016  . Acute extremity pain 10/31/2016  . 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 Pulmonary hypertension severe   . Sprain of left rotator cuff capsule   . Hyperkalemia   . Acute kidney injury superimposed on CKD ([place])   . Fever 10/08/2016  . Left arm pain 10/08/2016  . Paroxysmal SVT (supraventricular tachycardia) ([place]) 04/20/2016  . Type II diabetes mellitus ([place]) 04/20/2016  . Gout 04/20/2016  . Hyperuricemia 04/20/2016  . B12 deficiency 04/20/2016  . 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 Hydronephrosis   . Thrombocytosis ([place]) 04/07/2016  . Anemia, chronic disease 04/07/2016  . Pressure injury of skin 04/01/2016  . Pressure ulcer stage II 12/31/2015  . Pseudomonas urinary tract infection 12/30/2015  . Sepsis ([place]) 12/29/2015  . Acute lower UTI 12/29/2015  . T wave  inversion in EKG 12/29/2015  . PCP NOTES >>>>>>>>>>>>>>>>> 05/02/2015  . Arm pain   . Peripheral edema   . Unable to ambulate   . Thoracic aortic aneurysm ([place]) 04/26/2014  . CKD (chronic kidney disease) stage 3, GFR 30-59 ml/min (HCC) 04/26/2014  . 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 Chest pain 04/15/2014  . Encounter for palliative care 04/06/2014  . Generalized weakness 04/06/2014  . Venous stasis dermatitis of both lower extremities   . Malnutrition of moderate degree ([place]) 03/31/2014  . Morbid obesity due to excess calories ([place]) 03/29/2014  . 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 Poor compliance with advise  05/05/2012  . Annual physical exam 01/14/2012  . Weight loss 01/14/2012  . BPH (benign prostatic hyperplasia) 01/14/2012  . *Chronic lower extremity edema, stasis dermatitis 06/09/2011  . Symptomatic anemia 09/07/2006  . Myeloproliferative disease ([place]) 09/07/2006  . Obstructive sleep apnea 09/07/2006  . 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 Coronary atherosclerosis 09/07/2006  . Chronic diastolic CHF (congestive heart failure) ([place]) 09/07/2006  . Allergic rhinitis 09/07/2006    CMP     Component Value Date/Time   NA 140 05/08/2017 0645   NA 143 03/30/2017 1420   NA 138 11/16/2013 0847   K 4.9 05/08/2017 0645   K 4.3 03/30/2017 1420   K 4.5 11/16/2013 0847   CL 111 05/08/2017 0645   CL 110 (H) 03/30/2017 1420   CO2 22 05/08/2017 0645   CO2 27 03/30/2017 1420   CO2 22 11/16/2013 0847   GLUCOSE 105 (H) 05/08/2017 0645   GLUCOSE 106 03/30/2017 1420   BUN 45 (H) 05/08/2017 0645   BUN 38 (H) 03/30/2017 1420   BUN 20.2 11/16/2013 0847   CREATININE 1.41 (H) 05/08/2017 0645   CREATININE 1.3 (H) 03/30/2017 1420   CREATININE 1.2 11/16/2013 0847   CALCIUM 9.4 05/08/2017 0645   CALCIUM 9.8 03/30/2017 1420   CALCIUM 9.8 11/16/2013 0847   PROT 5.4 (L) 05/07/2017 0518   PROT 6.0  (L) 03/30/2017 1420   PROT 6.7 11/16/2013 0847   ALBUMIN 2.6 (L) 05/07/2017 0518   ALBUMIN 2.8 (L) 03/30/2017 1420   ALBUMIN 3.4 (L) 11/16/2013 0847   AST 33 05/07/2017 0518   AST 24 03/30/2017 1420  AST 25 11/16/2013 0847   ALT 25 05/07/2017 0518   ALT 22 03/30/2017 1420   ALT 11 11/16/2013 0847   ALKPHOS 92 05/07/2017 0518   ALKPHOS 116 (H) 03/30/2017 1420   ALKPHOS 125 11/16/2013 0847   BILITOT 0.5 05/07/2017 0518   BILITOT 0.40 03/30/2017 1420   BILITOT 0.35 11/16/2013 0847   GFRNONAA 47 (L) 05/08/2017 0645   GFRAA 55 (L) 05/08/2017 0645   Recent Labs    05/05/17 0405 05/06/17 0626 05/07/17 0518 05/08/17 0645  NA 138 138 140 140  K 4.4 4.2 4.5 4.9  CL 106 108 111 111  CO2 23 23 21* 22  GLUCOSE 122* 115* 84 105*  BUN 49* 48* 46* 45*  CREATININE 1.82* 1.67* 1.45* 1.41*  CALCIUM 9.0 9.2 9.4 9.4  MG 2.3 2.4 2.6*  --   PHOS 3.4 3.2 3.5  --    Recent Labs    05/05/17 0405 05/06/17 0626 05/07/17 0518  AST 21 24 33  ALT 11* 15* 25  ALKPHOS 85 96 92  BILITOT 0.4 0.4 0.5  PROT 5.0* 5.4* 5.4*  ALBUMIN 2.2* 2.5* 2.6*   Recent Labs    05/05/17 0405 05/06/17 0626 05/07/17 0518 05/07/17 1405 05/08/17 0645  WBC 21.7* 28.7* 27.6*  --  23.4*  NEUTROABS 18.9* 19.8* 15.2*  --   --   HGB 9.2* 10.3* 10.2* 9.9* 9.6*  HCT 30.6* 34.6* 34.4* 32.7* 32.4*  MCV 83.6 83.2 85.1  --  84.6  PLT 443* 541* 581*  --  536*   Recent Labs    11/14/16 0504  CHOL 176  LDLCALC 106*  TRIG 56   Lab Results  Component Value Date   MICROALBUR 0.5 05/31/2008   Lab Results  Component Value Date   TSH 2.49 08/22/2015   Lab Results  Component Value Date   HGBA1C 5.8 07/28/2016   Lab Results  Component Value Date   CHOL 176 11/14/2016   HDL 59 11/14/2016   LDLCALC 106 (H) 11/14/2016   TRIG 56 11/14/2016   CHOLHDL 3.0 11/14/2016    Significant Diagnostic Results in last 30 days:  Dg Tibia/fibula Right  Result Date: 05/04/2017 CLINICAL DATA:  Right leg swelling and  pain. EXAM: RIGHT TIBIA AND FIBULA - 2 VIEW COMPARISON:  None. FINDINGS: Diffuse mild osteosclerosis. No acute fracture or dislocation. No periosteal reaction or bone destruction. Diffuse mild soft tissue edema. No soft tissue emphysema. IMPRESSION: 1.  No acute osseous injury of the right tibia and fibula. 2. Diffuse mild osteosclerosis as can be seen with myeloproliferative disease. Electronically Signed   By: Kathreen Devoid   On: 05/04/2017 13:37   Ct Abdomen Pelvis W Contrast  Result Date: 04/30/2017 CLINICAL DATA:  Chronic severe groin pain, with infection.  Dysuria. EXAM: CT ABDOMEN AND PELVIS WITH CONTRAST TECHNIQUE: Multidetector CT imaging of the abdomen and pelvis was performed using the standard protocol following bolus administration of intravenous contrast. CONTRAST:  19m ISOVUE-300 IOPAMIDOL (ISOVUE-300) INJECTION 61% COMPARISON:  CT of the abdomen and pelvis performed 12/23/2016, and renal ultrasound performed 02/08/2017 FINDINGS: Lower chest: Right basilar airspace opacity likely reflects atelectasis. Scattered coronary artery calcifications are seen. Hepatobiliary: The liver is unremarkable in appearance. The gallbladder is unremarkable in appearance. The common bile duct remains normal in caliber. Pancreas:  The pancreas is within normal limits. Spleen: An apparent peripheral infarct is noted within the spleen. The spleen is enlarged, measuring 17.9 cm in length. Adrenals/Urinary Tract: The adrenal glands are unremarkable in appearance.  Nonspecific perinephric stranding is noted bilaterally. A right renal cyst is noted. There is mild wall enhancement along the right renal pelvis and proximal ureter, raising concern for right-sided pyelonephritis and ureteritis. No significant hydronephrosis is seen. No renal or ureteral stones are identified. Stomach/Bowel: The stomach is unremarkable in appearance. The small bowel is within normal limits. The appendix is normal in caliber, without evidence of  appendicitis. Scattered diverticulosis is noted along the descending and sigmoid colon, without evidence of diverticulitis. Vascular/Lymphatic: Scattered calcification is seen along the abdominal aorta and its branches. The abdominal aorta is otherwise grossly unremarkable. The inferior vena cava is grossly unremarkable. No retroperitoneal lymphadenopathy is seen. No pelvic sidewall lymphadenopathy is identified. Reproductive: There is diffuse irregular wall thickening about the bladder, with multiple bladder diverticula noted. The appearance raises concern for cystitis or chronic inflammation. The prostate remains normal in size. Other: A nonspecific 1.7 cm subcutaneous soft tissue nodule is noted at the left inguinal region. Underlying nodes are normal in size. Musculoskeletal: Diffuse heterogeneous sclerosis of the visualized osseous structures reflects the patient's myeloproliferative disorder. The visualized musculature is unremarkable in appearance. IMPRESSION: 1. Mild wall enhancement along the right renal pelvis and proximal right ureter, raising concern for right-sided pyelonephritis and ureteritis. No significant hydronephrosis seen. 2. Diffuse irregular wall thickening about the bladder, with multiple bladder diverticula. The appearance raises concern for cystitis or chronic inflammation. 3. 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 4. Nonspecific 1.7 cm subcutaneous soft tissue nodule at the left inguinal region. Underlying lymph nodes are normal in size. 5. Right basilar airspace opacity likely reflects atelectasis. 6. Scattered coronary artery calcifications noted. 7. Scattered diverticulosis along the descending and sigmoid colon, without evidence of diverticulitis. 8. Diffuse heterogeneous sclerosis of the visualized osseous structures reflects the patient's myeloproliferative disorder. Aortic Atherosclerosis (ICD10-I70.0). Electronically Signed   By: Garald Balding  M.D.   On: 04/30/2017 02:08   US Renal  Result Date: 05/06/2017 CLINICAL DATA:  Acute kidney injury superimposed upon chronic kidney disease, history hypertension, type II diabetes mellitus EXAM: RENAL / URINARY TRACT ULTRASOUND COMPLETE COMPARISON:  CT abdomen and pelvis 04/30/2017 FINDINGS: Right Kidney: Length: 10.8 cm. Normal cortical thickness. Increased cortical echogenicity. Cyst at inferior pole 3.0 x 2.7 x 2.2 cm with question minimal dependent debris. No additional mass, hydronephrosis or shadowing calcification Left Kidney: Length: 12.2 cm. Normal cortical thickness. Increased cortical echogenicity. Large echogenic focus 17 mm diameter at inferior pole with only minimal shadowing, uncertain etiology; appearance suggests a calculus but no calcification is seen on recent CT. No mass or hydronephrosis. Bladder: Multiple bladder diverticula. Dependent hypoechoic material within urinary bladder question debris. Scattered bladder wall thickening and trabeculation. Incidentally noted cholelithiasis. IMPRESSION: Cholelithiasis. Small RIGHT renal cyst. Medical renal disease changes of both kidneys. Multiple bladder diverticula, bladder wall thickening and trabeculation question chronic outlet obstruction. 17 mm echogenic focus with minimal shadowing at inferior pole of LEFT kidney, uncertain etiology; sonographic appearance suggests potential kidney stone though no calculus is evident by CT. This could represent a noncalcified calculus, air within the collecting system if patient had recent instrumentation; no stent seen on prior CT, nor evidence of a fat containing mass on prior CT exam. Electronically Signed   By: Lavonia Dana M.D.   On: 05/06/2017 11:11   Ir Fluoro Guide Cv Line Right  Result Date: 05/07/2017 INDICATION: Poor venous access. In need of durable intravenous access for medication administration. Given chronic renal insufficiency, request made for placement of  a tunneled PICC line. EXAM:  TUNNELED PICC LINE WITH ULTRASOUND AND FLUOROSCOPIC GUIDANCE MEDICATIONS: None. The antibiotic was given in an appropriate time interval prior to skin puncture. ANESTHESIA/SEDATION: None FLUOROSCOPY TIME:  12 seconds (2 mGy) COMPLICATIONS: None immediate. PROCEDURE: Informed written consent was obtained from the patient after a discussion of the risks, benefits, and alternatives to treatment. Questions regarding the procedure were encouraged and answered. The right neck and chest were prepped with chlorhexidine in a sterile fashion, and a sterile drape was applied covering the operative field. Maximum barrier sterile technique with sterile gowns and gloves were used for the procedure. A timeout was performed prior to the initiation of the procedure. After the overlying soft tissues were anesthetized, a small venotomy incision was created and a micropuncture kit was utilized to access the internal jugular vein. Real-time ultrasound guidance was utilized for vascular access including the acquisition of a permanent ultrasound image documenting patency of the accessed vessel. The microwire was utilized to measure appropriate catheter length. The micropuncture sheath was exchanged for a peel-away sheath over a guidewire. A 5 French dual lumen tunneled PICC measuring 21 cm was tunneled in a retrograde fashion from the anterior chest wall to the venotomy incision. The catheter was then placed through the peel-away sheath with tip ultimately positioned at the superior caval-atrial junction. Final catheter positioning was confirmed and documented with a spot radiographic image. The catheter aspirates and flushes normally. The catheter was flushed with appropriate volume heparin dwells. The catheter exit site was secured with a 0-Prolene retention suture. The venotomy incision was closed with Dermabond and Steri-strips. Dressings were applied. The patient tolerated the procedure well without immediate post procedural  complication. FINDINGS: After catheter placement, the tip lies within the superior cavoatrial junction. The catheter aspirates and flushes normally and is ready for immediate use. IMPRESSION: Successful placement of 21 cm dual lumen tunneled PICC catheter via the right internal jugular vein with tip terminating at the superior caval atrial junction. The catheter is ready for immediate use. Electronically Signed   By: Sandi Mariscal M.D.   On: 05/07/2017 11:25   Ir Fluoro Rm 30-60 Min  Result Date: 05/10/2017 CLINICAL DATA:  Bleeding tunnel central venous catheter. EXAM: IR FLOURO RM 0-60 MIN CONTRAST:  None FLUOROSCOPY TIME:  None COMPARISON:  Ultrasound fluoroscopic guided tunneled PICC line placement - earlier same day TECHNIQUE: The skin surrounding the exit site of the tunneled central venous catheter was prepped and draped in usual sterile fashion. The subcutaneous tissues surrounding the tunneled portion of the central venous catheter were anesthetized with 1% lidocaine with epinephrine. Next, a mattress suture was placed surrounding the exit site of the tunneled PICC line. Dressings were reapplied. FINDINGS: From the above maneuvers, pericatheter hemostasis has been achieved. IMPRESSION: Successful acquisition of pericatheter hemostasis as detailed above. The stitch surrounding the entrance site of the catheter will be removed tomorrow. Electronically Signed   By: Sandi Mariscal M.D.   On: 05/10/2017 09:48   Ir US Guide Vasc Access Right  Result Date: 05/07/2017 INDICATION: Poor venous access. In need of durable intravenous access for medication administration. Given chronic renal insufficiency, request made for placement of a tunneled PICC line. EXAM: TUNNELED PICC LINE WITH ULTRASOUND AND FLUOROSCOPIC GUIDANCE MEDICATIONS: None. The antibiotic was given in an appropriate time interval prior to skin puncture. ANESTHESIA/SEDATION: None FLUOROSCOPY TIME:  12 seconds (2 mGy) COMPLICATIONS: None immediate.  PROCEDURE: Informed written consent was obtained from the patient after a discussion of the risks,  benefits, and alternatives to treatment. Questions regarding the procedure were encouraged and answered. The right neck and chest were prepped with chlorhexidine in a sterile fashion, and a sterile drape was applied covering the operative field. Maximum barrier sterile technique with sterile gowns and gloves were used for the procedure. A timeout was performed prior to the initiation of the procedure. After the overlying soft tissues were anesthetized, a small venotomy incision was created and a micropuncture kit was utilized to access the internal jugular vein. Real-time ultrasound guidance was utilized for vascular access including the acquisition of a permanent ultrasound image documenting patency of the accessed vessel. The microwire was utilized to measure appropriate catheter length. The micropuncture sheath was exchanged for a peel-away sheath over a guidewire. A 5 French dual lumen tunneled PICC measuring 21 cm was tunneled in a retrograde fashion from the anterior chest wall to the venotomy incision. The catheter was then placed through the peel-away sheath with tip ultimately positioned at the superior caval-atrial junction. Final catheter positioning was confirmed and documented with a spot radiographic image. The catheter aspirates and flushes normally. The catheter was flushed with appropriate volume heparin dwells. The catheter exit site was secured with a 0-Prolene retention suture. The venotomy incision was closed with Dermabond and Steri-strips. Dressings were applied. The patient tolerated the procedure well without immediate post procedural complication. FINDINGS: After catheter placement, the tip lies within the superior cavoatrial junction. The catheter aspirates and flushes normally and is ready for immediate use. IMPRESSION: Successful placement of 21 cm dual lumen tunneled PICC catheter via the  right internal jugular vein with tip terminating at the superior caval atrial junction. The catheter is ready for immediate use. Electronically Signed   By: Sandi Mariscal M.D.   On: 05/07/2017 11:25    Assessment and Plan  Bilateral lower extremity edema-we'll increase patient's Lasix up to 80 mg twice a day for 7 days; we'll monitor response; have ordered a BMP to look at renal function and electrolytes for 05-20-17    Noah Delaine. [name], MD

## 2017-05-24 LAB — BASIC METABOLIC PANEL
BUN: 47 — AB (ref 4–21)
CREATININE: 1.1 (ref 0.6–1.3)
Glucose: 93
POTASSIUM: 5.5 — AB (ref 3.4–5.3)
Sodium: 144 (ref 137–147)

## 2017-05-27 ENCOUNTER — Telehealth: Payer: Self-pay

## 2017-05-27 ENCOUNTER — Other Ambulatory Visit: Payer: Self-pay

## 2017-05-27 ENCOUNTER — Ambulatory Visit: Payer: Self-pay | Admitting: Hematology & Oncology

## 2017-05-27 ENCOUNTER — Ambulatory Visit: Payer: Self-pay | Admitting: Internal Medicine

## 2017-05-27 NOTE — Telephone Encounter (Signed)
Unfortunately, transportation is an issue he has -likely- no control over.  No dismissal for now.

## 2017-05-27 NOTE — Telephone Encounter (Signed)
Multiple no shows/cancellations:   05/27/2017 02/11/2017 02/01/2017 10/01/2016 05/19/2016 04/30/2016 02/05/2015 02/01/2015 01/28/2015 01/10/2015 06/28/2014 06/22/2014 06/13/2014  Would you like to begin dismissal process?

## 2017-05-27 NOTE — Telephone Encounter (Signed)
Noted  

## 2017-06-03 ENCOUNTER — Ambulatory Visit: Payer: Self-pay | Admitting: Internal Medicine

## 2017-06-07 ENCOUNTER — Encounter (HOSPITAL_COMMUNITY): Payer: Self-pay

## 2017-06-07 ENCOUNTER — Inpatient Hospital Stay (HOSPITAL_COMMUNITY)
Admission: EM | Admit: 2017-06-07 | Discharge: 2017-06-13 | DRG: 378 | Disposition: A | Discharge status: Home / Self Care | Payer: Medicare Other | Attending: Internal Medicine | Admitting: Internal Medicine

## 2017-06-07 ENCOUNTER — Inpatient Hospital Stay (HOSPITAL_COMMUNITY): Payer: Medicare Other

## 2017-06-07 ENCOUNTER — Other Ambulatory Visit: Payer: Self-pay

## 2017-06-07 DIAGNOSIS — N183 Chronic kidney disease, stage 3 unspecified: Secondary | ICD-10-CM | POA: Diagnosis present

## 2017-06-07 DIAGNOSIS — D509 Iron deficiency anemia, unspecified: Secondary | ICD-10-CM | POA: Diagnosis not present

## 2017-06-07 DIAGNOSIS — K5730 Diverticulosis of large intestine without perforation or abscess without bleeding: Secondary | ICD-10-CM | POA: Diagnosis not present

## 2017-06-07 DIAGNOSIS — K5790 Diverticulosis of intestine, part unspecified, without perforation or abscess without bleeding: Secondary | ICD-10-CM | POA: Diagnosis not present

## 2017-06-07 DIAGNOSIS — K5731 Diverticulosis of large intestine without perforation or abscess with bleeding: Principal | ICD-10-CM | POA: Diagnosis present

## 2017-06-07 DIAGNOSIS — N179 Acute kidney failure, unspecified: Secondary | ICD-10-CM | POA: Diagnosis present

## 2017-06-07 DIAGNOSIS — G4733 Obstructive sleep apnea (adult) (pediatric): Secondary | ICD-10-CM | POA: Diagnosis present

## 2017-06-07 DIAGNOSIS — K922 Gastrointestinal hemorrhage, unspecified: Secondary | ICD-10-CM | POA: Diagnosis not present

## 2017-06-07 DIAGNOSIS — R609 Edema, unspecified: Secondary | ICD-10-CM | POA: Diagnosis present

## 2017-06-07 DIAGNOSIS — D7581 Myelofibrosis: Secondary | ICD-10-CM | POA: Diagnosis present

## 2017-06-07 DIAGNOSIS — Z7982 Long term (current) use of aspirin: Secondary | ICD-10-CM | POA: Diagnosis not present

## 2017-06-07 DIAGNOSIS — I10 Essential (primary) hypertension: Secondary | ICD-10-CM | POA: Diagnosis not present

## 2017-06-07 DIAGNOSIS — A419 Sepsis, unspecified organism: Secondary | ICD-10-CM

## 2017-06-07 DIAGNOSIS — R100 Acute abdomen: Secondary | ICD-10-CM | POA: Diagnosis not present

## 2017-06-07 DIAGNOSIS — E1122 Type 2 diabetes mellitus with diabetic chronic kidney disease: Secondary | ICD-10-CM | POA: Diagnosis present

## 2017-06-07 DIAGNOSIS — D500 Iron deficiency anemia secondary to blood loss (chronic): Secondary | ICD-10-CM | POA: Diagnosis present

## 2017-06-07 DIAGNOSIS — Z79899 Other long term (current) drug therapy: Secondary | ICD-10-CM | POA: Diagnosis not present

## 2017-06-07 DIAGNOSIS — Z87891 Personal history of nicotine dependence: Secondary | ICD-10-CM

## 2017-06-07 DIAGNOSIS — I471 Supraventricular tachycardia, unspecified: Secondary | ICD-10-CM | POA: Diagnosis present

## 2017-06-07 DIAGNOSIS — C946 Myelodysplastic disease, not classified: Secondary | ICD-10-CM | POA: Diagnosis present

## 2017-06-07 DIAGNOSIS — I5032 Chronic diastolic (congestive) heart failure: Secondary | ICD-10-CM | POA: Diagnosis not present

## 2017-06-07 DIAGNOSIS — N1330 Unspecified hydronephrosis: Secondary | ICD-10-CM | POA: Diagnosis present

## 2017-06-07 DIAGNOSIS — K625 Hemorrhage of anus and rectum: Secondary | ICD-10-CM | POA: Diagnosis not present

## 2017-06-07 DIAGNOSIS — K2970 Gastritis, unspecified, without bleeding: Secondary | ICD-10-CM | POA: Diagnosis not present

## 2017-06-07 DIAGNOSIS — R0902 Hypoxemia: Secondary | ICD-10-CM | POA: Diagnosis not present

## 2017-06-07 DIAGNOSIS — N3289 Other specified disorders of bladder: Secondary | ICD-10-CM | POA: Diagnosis not present

## 2017-06-07 DIAGNOSIS — R531 Weakness: Secondary | ICD-10-CM | POA: Diagnosis not present

## 2017-06-07 DIAGNOSIS — E876 Hypokalemia: Secondary | ICD-10-CM | POA: Diagnosis not present

## 2017-06-07 DIAGNOSIS — D473 Essential (hemorrhagic) thrombocythemia: Secondary | ICD-10-CM | POA: Diagnosis not present

## 2017-06-07 DIAGNOSIS — I2510 Atherosclerotic heart disease of native coronary artery without angina pectoris: Secondary | ICD-10-CM | POA: Diagnosis present

## 2017-06-07 DIAGNOSIS — R600 Localized edema: Secondary | ICD-10-CM | POA: Diagnosis present

## 2017-06-07 DIAGNOSIS — I130 Hypertensive heart and chronic kidney disease with heart failure and stage 1 through stage 4 chronic kidney disease, or unspecified chronic kidney disease: Secondary | ICD-10-CM | POA: Diagnosis present

## 2017-06-07 DIAGNOSIS — D62 Acute posthemorrhagic anemia: Secondary | ICD-10-CM | POA: Diagnosis not present

## 2017-06-07 DIAGNOSIS — R109 Unspecified abdominal pain: Secondary | ICD-10-CM | POA: Diagnosis not present

## 2017-06-07 DIAGNOSIS — Z9981 Dependence on supplemental oxygen: Secondary | ICD-10-CM

## 2017-06-07 DIAGNOSIS — D72829 Elevated white blood cell count, unspecified: Secondary | ICD-10-CM | POA: Diagnosis not present

## 2017-06-07 DIAGNOSIS — Z452 Encounter for adjustment and management of vascular access device: Secondary | ICD-10-CM

## 2017-06-07 DIAGNOSIS — R110 Nausea: Secondary | ICD-10-CM | POA: Diagnosis not present

## 2017-06-07 DIAGNOSIS — R918 Other nonspecific abnormal finding of lung field: Secondary | ICD-10-CM

## 2017-06-07 DIAGNOSIS — D471 Chronic myeloproliferative disease: Secondary | ICD-10-CM | POA: Diagnosis not present

## 2017-06-07 DIAGNOSIS — I272 Pulmonary hypertension, unspecified: Secondary | ICD-10-CM | POA: Diagnosis present

## 2017-06-07 DIAGNOSIS — R05 Cough: Secondary | ICD-10-CM | POA: Diagnosis not present

## 2017-06-07 DIAGNOSIS — I959 Hypotension, unspecified: Secondary | ICD-10-CM | POA: Diagnosis not present

## 2017-06-07 LAB — CBC WITH DIFFERENTIAL/PLATELET
BASOS ABS: 0.5 10*3/uL — AB (ref 0.0–0.1)
Basophils Relative: 3 %
EOS ABS: 0.5 10*3/uL (ref 0.0–0.7)
Eosinophils Relative: 3 %
HCT: 26.1 % — ABNORMAL LOW (ref 39.0–52.0)
Hemoglobin: 8 g/dL — ABNORMAL LOW (ref 13.0–17.0)
LYMPHS PCT: 21 %
Lymphs Abs: 3.6 10*3/uL (ref 0.7–4.0)
MCH: 26.1 pg (ref 26.0–34.0)
MCHC: 30.7 g/dL (ref 30.0–36.0)
MCV: 85.3 fL (ref 78.0–100.0)
MONO ABS: 1.6 10*3/uL — AB (ref 0.1–1.0)
Monocytes Relative: 9 %
NEUTROS PCT: 64 %
Neutro Abs: 11.1 10*3/uL — ABNORMAL HIGH (ref 1.7–7.7)
PLATELETS: 497 10*3/uL — AB (ref 150–400)
RBC: 3.06 MIL/uL — AB (ref 4.22–5.81)
RDW: 21.3 % — AB (ref 11.5–15.5)
WBC: 17.3 10*3/uL — AB (ref 4.0–10.5)

## 2017-06-07 LAB — PROTIME-INR
INR: 1.22
Prothrombin Time: 15.3 seconds — ABNORMAL HIGH (ref 11.4–15.2)

## 2017-06-07 LAB — COMPREHENSIVE METABOLIC PANEL
ALBUMIN: 2.3 g/dL — AB (ref 3.5–5.0)
ALT: 16 U/L — AB (ref 17–63)
AST: 21 U/L (ref 15–41)
Alkaline Phosphatase: 138 U/L — ABNORMAL HIGH (ref 38–126)
Anion gap: 9 (ref 5–15)
BUN: 22 mg/dL — AB (ref 6–20)
CHLORIDE: 109 mmol/L (ref 101–111)
CO2: 25 mmol/L (ref 22–32)
Calcium: 8.6 mg/dL — ABNORMAL LOW (ref 8.9–10.3)
Creatinine, Ser: 1.28 mg/dL — ABNORMAL HIGH (ref 0.61–1.24)
GFR calc Af Amer: >60 mL/min (ref >60)
GFR, EST NON AFRICAN AMERICAN: 53 mL/min — AB (ref >60)
Glucose, Bld: 164 mg/dL — ABNORMAL HIGH (ref 65–99)
Potassium: 2.6 mmol/L — CL (ref 3.5–5.1)
SODIUM: 143 mmol/L (ref 135–145)
Total Bilirubin: 0.6 mg/dL (ref 0.3–1.2)
Total Protein: 4.7 g/dL — ABNORMAL LOW (ref 6.5–8.1)

## 2017-06-07 LAB — LIPASE, BLOOD: LIPASE: 20 U/L (ref 11–51)

## 2017-06-07 LAB — POC OCCULT BLOOD, ED: FECAL OCCULT BLD: POSITIVE — AB

## 2017-06-07 LAB — I-STAT TROPONIN, ED: Troponin i, poc: 0.01 ng/mL (ref 0.00–0.08)

## 2017-06-07 LAB — PREPARE RBC (CROSSMATCH)

## 2017-06-07 MED ORDER — SODIUM CHLORIDE 0.9 % IV SOLN: 250.0000 mL | INTRAVENOUS | Status: DC | PRN

## 2017-06-07 MED ORDER — POTASSIUM CHLORIDE 10 MEQ/100ML IV SOLN
10.0000 meq | INTRAVENOUS | Status: AC
  Administered 2017-06-07 (×3): 10 meq via INTRAVENOUS
  Filled 2017-06-07 (×3): qty 100

## 2017-06-07 MED ORDER — GABAPENTIN 100 MG PO CAPS
200.0000 mg | ORAL_CAPSULE | Freq: Three times a day (TID) | ORAL | Status: DC | PRN
  Administered 2017-06-08: 200 mg via ORAL
  Filled 2017-06-07 (×2): qty 2

## 2017-06-07 MED ORDER — SODIUM CHLORIDE 0.9% FLUSH: 3.0000 mL | INTRAVENOUS | Status: DC | PRN

## 2017-06-07 MED ORDER — FUROSEMIDE 40 MG PO TABS
40.0000 mg | ORAL_TABLET | Freq: Every day | ORAL | Status: DC
  Administered 2017-06-07: 40 mg via ORAL
  Filled 2017-06-07: qty 2

## 2017-06-07 MED ORDER — SODIUM CHLORIDE 0.9% FLUSH
10.0000 mL | INTRAVENOUS | Status: DC | PRN
  Administered 2017-06-10: 20 mL
  Filled 2017-06-07: qty 40

## 2017-06-07 MED ORDER — IOPAMIDOL (ISOVUE-370) INJECTION 76%
INTRAVENOUS | Status: AC
  Administered 2017-06-07: 100 mL via INTRAVENOUS
  Filled 2017-06-07: qty 100

## 2017-06-07 MED ORDER — HYDRALAZINE HCL 25 MG PO TABS
25.0000 mg | ORAL_TABLET | Freq: Three times a day (TID) | ORAL | Status: DC
  Administered 2017-06-07 (×2): 25 mg via ORAL
  Filled 2017-06-07 (×2): qty 1

## 2017-06-07 MED ORDER — FUROSEMIDE 10 MG/ML IJ SOLN
40.0000 mg | Freq: Once | INTRAMUSCULAR | Status: AC
  Administered 2017-06-07: 40 mg via INTRAVENOUS
  Filled 2017-06-07: qty 4

## 2017-06-07 MED ORDER — SODIUM CHLORIDE 0.9 % IV SOLN
10.0000 mL/h | Freq: Once | INTRAVENOUS | Status: AC
  Administered 2017-06-07: 10 mL/h via INTRAVENOUS

## 2017-06-07 MED ORDER — TAMSULOSIN HCL 0.4 MG PO CAPS
0.4000 mg | ORAL_CAPSULE | Freq: Every day | ORAL | Status: DC
  Administered 2017-06-07 – 2017-06-13 (×7): 0.4 mg via ORAL
  Filled 2017-06-07 (×7): qty 1

## 2017-06-07 MED ORDER — SODIUM CHLORIDE 0.9 % IV SOLN
Freq: Once | INTRAVENOUS | Status: AC
  Administered 2017-06-07: 13:00:00 via INTRAVENOUS

## 2017-06-07 MED ORDER — SODIUM CHLORIDE 0.9% FLUSH
3.0000 mL | Freq: Two times a day (BID) | INTRAVENOUS | Status: DC
  Administered 2017-06-07 – 2017-06-11 (×7): 3 mL via INTRAVENOUS

## 2017-06-07 MED ORDER — ONDANSETRON HCL 4 MG PO TABS
4.0000 mg | ORAL_TABLET | Freq: Four times a day (QID) | ORAL | Status: DC | PRN
Start: 2017-06-07 — End: 2017-06-13

## 2017-06-07 MED ORDER — ONDANSETRON HCL 4 MG/2ML IJ SOLN: 4.0000 mg | Freq: Four times a day (QID) | INTRAMUSCULAR | Status: DC | PRN

## 2017-06-07 NOTE — Consult Note (Addendum)
Unassigned patient Reason for Consult: Rectal bleeding Referring Physician: ER MD  Juan [name] is an 77 y.o. male.  HPI: 77 year old black male with multiple medical problems listed below presented to the ER with profuse rectal bleeding. He was brought to the hospital by EMS. He was in rehabilitation after he was recently discharged from the hospital for complications associated with sepsis and pyelonephritis. He also has HTN, CAD, CHF, renal insufficiency, myeloproliferative disease at about 3 AM this morning he started passing currant jelly like stools and had some periumbilical abdominal pain. He denies having any fever chills or rigors. He denies a history of peptic ulcer disease. He denies use of nonsteroidals and has never had hematochezia before. Reportedly had a colonoscopy done in 2005. I'm unable to locate that report in Epic. He was noted to have sigmoid and descending colon diverticulosis on CT done on 04/30/2017.  Past Medical History:  Diagnosis Date  . Allergic rhinitis   . Anemia   . Ascending aortic aneurysm ([place]) 03/2014   4.3cm on CT scan  . CAD (coronary artery disease)    dx elsewheer in past, no documentation. Non-ischemic myoview 2007  . Chronic diastolic CHF (congestive heart failure), NYHA class 2 (HCC)    Normal EF w/ grade 1 dd by echo 12/2015  . Edema    R>L leg, u/s 5-12 neg for DVT  . Hemorrhoid   . History of thrombocytosis   . Hypertension   . Iron deficiency anemia due to chronic blood loss 03/31/2017  . 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 Migraine    "once/wk at least" (07/11/2013)  . Myeloproliferative disease ([place])   . On home oxygen therapy    "2L; all the time" (05/03/2017)  . Pyelonephritis 04/29/2017  . Shortness of breath   . Sinus congestion   . Sleep apnea, obstructive    at some point used CPAP, was d/c  years ago  . Type II diabetes mellitus ([place])    Past Surgical History:  Procedure Laterality Date  . IR FLUORO GUIDE CV LINE RIGHT   05/07/2017  . IR FLUORO RM 30-60 MIN  05/07/2017  . IR US GUIDE VASC ACCESS RIGHT  05/07/2017  . TOE SURGERY Right    "tried to straighten out big toe" (07/11/2013)   Family History  Problem Relation Age of Onset  . Schizophrenia Son   . Mental illness Daughter   . Colon cancer Neg Hx   . Prostate cancer Neg Hx   . Heart attack Neg Hx   . Diabetes Neg Hx    Social History:  reports that he quit smoking about 51 years ago. His smoking use included cigarettes. He has a 3.00 pack-year smoking history. he has never used smokeless tobacco. He reports that he does not drink alcohol or use drugs.  Allergies: No Known Allergies  Medications: I have reviewed the patient's current medications.  Results for orders placed or performed during the hospital encounter of 06/07/17 (from the past 48 hour(s))  CBC with Differential     Status: Abnormal   Collection Time: 06/07/17  9:16 AM  Result Value Ref Range   WBC 17.3 (H) 4.0 - 10.5 K/uL    Comment: REPEATED TO VERIFY   RBC 3.06 (L) 4.22 - 5.81 MIL/uL   Hemoglobin 8.0 (L) 13.0 - 17.0 g/dL    Comment: REPEATED TO VERIFY   HCT 26.1 (L) 39.0 - 52.0 %   MCV 85.3 78.0 - 100.0 fL   MCH 26.1 26.0 -  34.0 pg   MCHC 30.7 30.0 - 36.0 g/dL   RDW 21.3 (H) 11.5 - 15.5 %   Platelets 497 (H) 150 - 400 K/uL    Comment: REPEATED TO VERIFY   Neutrophils Relative % 64 %   Lymphocytes Relative 21 %   Monocytes Relative 9 %   Eosinophils Relative 3 %   Basophils Relative 3 %   Neutro Abs 11.1 (H) 1.7 - 7.7 K/uL   Lymphs Abs 3.6 0.7 - 4.0 K/uL   Monocytes Absolute 1.6 (H) 0.1 - 1.0 K/uL   Eosinophils Absolute 0.5 0.0 - 0.7 K/uL   Basophils Absolute 0.5 (H) 0.0 - 0.1 K/uL   RBC Morphology POLYCHROMASIA PRESENT     Comment: RARE NRBCs ELLIPTOCYTES TEARDROP CELLS    WBC Morphology MILD LEFT SHIFT (1-5% METAS, OCC MYELO, OCC BANDS)     Comment: ATYPICAL LYMPHOCYTES  Comprehensive metabolic panel     Status: Abnormal   Collection Time: 06/07/17  9:16 AM   Result Value Ref Range   Sodium 143 135 - 145 mmol/L   Potassium 2.6 (LL) 3.5 - 5.1 mmol/L    Comment: CRITICAL RESULT CALLED TO, READ BACK BY AND VERIFIED WITH: M.PETERS,RN 06/07/17 1106 BY BSLADE    Chloride 109 101 - 111 mmol/L   CO2 25 22 - 32 mmol/L   Glucose, Bld 164 (H) 65 - 99 mg/dL   BUN 22 (H) 6 - 20 mg/dL   Creatinine, Ser 1.28 (H) 0.61 - 1.24 mg/dL   Calcium 8.6 (L) 8.9 - 10.3 mg/dL   Total Protein 4.7 (L) 6.5 - 8.1 g/dL   Albumin 2.3 (L) 3.5 - 5.0 g/dL   AST 21 15 - 41 U/L   ALT 16 (L) 17 - 63 U/L   Alkaline Phosphatase 138 (H) 38 - 126 U/L   Total Bilirubin 0.6 0.3 - 1.2 mg/dL   GFR calc non Af Amer 53 (L) >60 mL/min   GFR calc Af Amer >60 >60 mL/min    Comment: (NOTE) The eGFR has been calculated using the CKD EPI equation. This calculation has not been validated in all clinical situations. eGFR's persistently <60 mL/min signify possible Chronic Kidney Disease.    Anion gap 9 5 - 15  Lipase, blood     Status: None   Collection Time: 06/07/17  9:16 AM  Result Value Ref Range   Lipase 20 11 - 51 U/L  Protime-INR     Status: Abnormal   Collection Time: 06/07/17  9:16 AM  Result Value Ref Range   Prothrombin Time 15.3 (H) 11.4 - 15.2 seconds   INR 1.22   Type and screen [place]     Status: None (Preliminary result)   Collection Time: 06/07/17 10:00 AM  Result Value Ref Range   ABO/RH(D) O POS    Antibody Screen NEG    Sample Expiration 06/10/2017    Unit Number [id number]    Blood Component Type RED CELLS,LR    Unit division 00    Status of Unit ISSUED    Transfusion Status OK TO TRANSFUSE    Crossmatch Result Compatible    Unit Number [id number]    Blood Component Type RED CELLS,LR    Unit division 00    Status of Unit ALLOCATED    Transfusion Status OK TO TRANSFUSE    Crossmatch Result Compatible    Unit Number [id number]    Blood Component Type RED CELLS,LR    Unit division 00  Status of Unit ALLOCATED     Transfusion Status OK TO TRANSFUSE    Crossmatch Result Compatible   I-stat troponin, ED     Status: None   Collection Time: 06/07/17 10:11 AM  Result Value Ref Range   Troponin i, poc 0.01 0.00 - 0.08 ng/mL   Comment 3            Comment: Due to the release kinetics of cTnI, a negative result within the first hours of the onset of symptoms does not rule out myocardial infarction with certainty. If myocardial infarction is still suspected, repeat the test at appropriate intervals.   Prepare RBC     Status: None   Collection Time: 06/07/17 11:17 AM  Result Value Ref Range   Order Confirmation ORDER PROCESSED BY BLOOD BANK   POC occult blood, ED Provider will collect     Status: Abnormal   Collection Time: 06/07/17 11:58 AM  Result Value Ref Range   Fecal Occult Bld POSITIVE (A) NEGATIVE  Prepare RBC     Status: None   Collection Time: 06/07/17 12:23 PM  Result Value Ref Range   Order Confirmation ORDER PROCESSED BY BLOOD BANK    Review of Systems  Constitutional: Positive for diaphoresis and malaise/fatigue. Negative for chills, fever and weight loss.  HENT: Negative.   Eyes: Negative.   Respiratory: Negative.   Cardiovascular: Positive for leg swelling.  Gastrointestinal: Positive for abdominal pain and blood in stool. Negative for heartburn, nausea and vomiting.  Genitourinary: Negative.   Musculoskeletal: Positive for joint pain.  Skin: Negative.   Neurological: Positive for weakness.  Psychiatric/Behavioral: Negative.    Blood pressure (!) 158/75, pulse 71, temperature 98.8 F (37.1 C), temperature source Oral, resp. rate 16, height '6\' 3"'$  (1.905 m), weight 98.4 kg (217 lb), SpO2 99 %. Physical Exam  Constitutional: He is oriented to person, place, and time. He appears well-developed and well-nourished.  HENT:  Head: Normocephalic and atraumatic.  Eyes: Conjunctivae and EOM are normal. Pupils are equal, round, and reactive to light.  Neck: Normal range of motion. Neck  supple.  Cardiovascular: Normal rate and regular rhythm.  Respiratory: Effort normal and breath sounds normal.  GI: Soft. Bowel sounds are normal. He exhibits no mass. There is tenderness. There is no rebound and no guarding.  Neurological: He is alert and oriented to person, place, and time.  Skin: Skin is warm and dry.  Psychiatric: He has a normal mood and affect. His behavior is normal. Judgment and thought content normal.   Assessment/Plan: 1) Rectal bleeding with periumbilical pain-acute on chronic anemia-as per my discussion with Dr. Melina Copa in the ER plans are to do a CT angiogram for the recommendation made after this is been done. He is noted to have sigmoid: Diverticulosis and a CT done December 2018. 2) Myeloproliferative disorder. 3) Chronic diastolic congestive heart failure. 4) Chronic kidney disease. 5) History of paroxysmal SVT currently rate controlled. 6) Severe pulmonary hypertension. Juan [name] 06/07/2017, 3:58 PM

## 2017-06-07 NOTE — ED Notes (Signed)
Pt again up to bedpan for approx 100 cc bloody stool.

## 2017-06-07 NOTE — ED Triage Notes (Signed)
Pt arrives EMS from [facility] where hje had been for 1 month for rehab after sepsis. Rectal bleeding noted this AM. "like Jelly". hematuria also noted by NH. C/o abdominal pain.

## 2017-06-07 NOTE — ED Notes (Signed)
Labs obtained from PICC line at right chest after confirmed placement in our facility.

## 2017-06-07 NOTE — ED Notes (Signed)
Pt placed on bedpan

## 2017-06-07 NOTE — ED Notes (Addendum)
Pt has grossly bloody bowel movement . Mostly ligquid approx 300-400 cc. Will inform MD.

## 2017-06-07 NOTE — ED Notes (Signed)
Patient transported to ct

## 2017-06-07 NOTE — ED Notes (Signed)
Attempted report 

## 2017-06-07 NOTE — ED Provider Notes (Signed)
[facility]ERGENCY DEPARTMENT Provider Note   CSN: [id number] Arrival date & time: 06/07/17  0160     History   Chief Complaint Chief Complaint  Patient presents with  . GI Bleeding    HPI Juan [name] is a 77 y.o. male.  The history is provided by the patient and the EMS personnel.  Rectal Bleeding  Quality:  Maroon Amount:  Moderate Timing:  Intermittent Chronicity:  New Context: defecation   Context: not rectal pain   Similar prior episodes: no   Relieved by:  None tried Worsened by:  Nothing Ineffective treatments:  None tried Associated symptoms: abdominal pain and recent illness   Associated symptoms: no dizziness, no fever, no hematemesis, no light-headedness, no loss of consciousness and no vomiting   Risk factors: no anticoagulant use     77 year old male with history of hypertension CAD renal insufficiency myeloproliferative disease who was recently admitted to the hospital for sepsis/[place] and is now at rehab.  Today he was brought in by ambulance after having some red jelly stool at 3 AM this morning.  Patient is complaining that his stomach feels a little funny but no pain.  He denies fever.  States he has a mild headache.  He is never had rectal bleeding before.  He states yesterday he was in his usual state of health.   Past Medical History:  Diagnosis Date  . Allergic rhinitis   . Anemia   . Ascending aortic aneurysm ([place]) 03/2014   4.3cm on CT scan  . CAD (coronary artery disease)    dx elsewheer in past, no documentation. Non-ischemic myoview 2007  . Chronic diastolic CHF (congestive heart failure), NYHA class 2 (HCC)    Normal EF w/ grade 1 dd by echo 12/2015  . Edema    R>L leg, u/s 5-12 neg for DVT  . Hemorrhoid   . History of thrombocytosis   . Hypertension   . Iron deficiency anemia due to chronic blood loss 03/31/2017  . 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 Migraine    "once/wk at least" (07/11/2013)  . Myeloproliferative  disease ([place])   . On home oxygen therapy    "2L; all the time" (05/03/2017)  . Pyelonephritis 04/29/2017  . Shortness of breath   . Sinus congestion   . Sleep apnea, obstructive    at some point used CPAP, was d/c  years ago  . Type II diabetes mellitus [facility])     Patient Active Problem List   Diagnosis Date Noted  . Iron deficiency anemia due to chronic blood loss 03/31/2017  . 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 Urinary retention 01/24/2017  . 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 Elevated troponin 11/14/2016  . Recurrent pneumonia 10/31/2016  . Pulmonary hypertension severe   . Sprain of left rotator cuff capsule   . Hyperkalemia   . Paroxysmal SVT (supraventricular tachycardia) ([place]) 04/20/2016  . Type II diabetes mellitus ([place]) 04/20/2016  . Gout 04/20/2016  . B12 deficiency 04/20/2016  . 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 Hydronephrosis   . Thrombocytosis ([place]) 04/07/2016  . Pressure ulcer stage II 12/31/2015  . T wave inversion in EKG 12/29/2015  . PCP NOTES >>>>>>>>>>>>>>>>> 05/02/2015  . Peripheral edema   . Thoracic aortic aneurysm ([place]) 04/26/2014  . CKD (chronic kidney disease) stage 3, GFR 30-59 ml/min (HCC) 04/26/2014  . Generalized weakness 04/06/2014  . Venous stasis dermatitis of both lower extremities   . Morbid obesity due  to excess calories ([place]) 03/29/2014  . 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 Poor compliance with advise  05/05/2012  . Annual physical exam 01/14/2012  . Weight loss 01/14/2012  . BPH (benign prostatic hyperplasia) 01/14/2012  . Myeloproliferative disease ([place]) 09/07/2006  . Obstructive sleep apnea 09/07/2006  . 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 Coronary atherosclerosis 09/07/2006  . Chronic diastolic CHF (congestive heart failure) ([place]) 09/07/2006  . Allergic rhinitis 09/07/2006    Past Surgical History:  Procedure  Laterality Date  . IR FLUORO GUIDE CV LINE RIGHT  05/07/2017  . IR FLUORO RM 30-60 MIN  05/07/2017  . IR US GUIDE VASC ACCESS RIGHT  05/07/2017  . TOE SURGERY Right    "tried to straighten out big toe" (07/11/2013)       Home Medications    Prior to Admission medications   Medication Sig Start Date End Date Taking? Authorizing Provider  acetaminophen (TYLENOL) 325 MG tablet Take 2 tablets (650 mg total) by mouth every 6 (six) hours as needed for mild pain (or Fever >/= 101). 05/08/17   Rai, Vernelle Emerald, MD  aspirin 325 MG tablet Take 325 mg by mouth daily.    [provider]  b complex vitamins tablet Take 1 tablet by mouth daily.    [provider]  colchicine 0.6 MG tablet Take 1 tablet (0.6 mg total) by mouth daily. 05/08/17   Rai, Vernelle Emerald, MD  diclofenac sodium (VOLTAREN) 1 % GEL Apply 4 g topically 4 (four) times daily. Apply to bilateral  knees 05/08/17   Rai, Ripudeep K, MD  ferrous gluconate (FERGON) 240 (27 FE) MG tablet Take 240 mg by mouth daily.    [provider]  fluticasone (FLONASE) 50 MCG/ACT nasal spray Place 2 sprays into both nostrils daily. Reported on 05/02/2015    [provider]  furosemide (LASIX) 40 MG tablet Take 40 mg by mouth daily.    [provider]  gabapentin (NEURONTIN) 100 MG capsule Take 2 capsules (200 mg total) by mouth 3 (three) times daily as needed (pain). Reported on 08/22/2015 05/08/17   Mendel Corning, MD  hydrALAZINE (APRESOLINE) 25 MG tablet Take 1 tablet (25 mg total) by mouth 3 (three) times daily. 02/09/17 05/13/17  Arbutus Leas, NP  hydrocerin (EUCERIN) CREA Apply Eucerin cream to BLE Q day after bathing and roughly towel drying to remove loose skin 05/08/17   Rai, Ripudeep K, MD  hydrOXYzine (ATARAX/VISTARIL) 10 MG tablet Take 1 tablet (10 mg total) by mouth every 4 (four) hours as needed for itching. 05/08/17   Rai, Vernelle Emerald, MD  OXYGEN Inhale 2 L into the lungs continuous.    [provider]  polyethylene glycol (MIRALAX / GLYCOLAX) packet Take 17 g by mouth daily as needed for mild constipation. 01/26/17   Debbe Odea, MD  tamsulosin (FLOMAX) 0.4 MG CAPS capsule Take 0.4 mg by mouth.    [provider]    Family History Family History  Problem Relation Age of Onset  . Schizophrenia Son   . Mental illness Daughter   . Colon cancer Neg Hx   . Prostate cancer Neg Hx   . Heart attack Neg Hx   . Diabetes Neg Hx     Social History Social History   Tobacco Use  . Smoking status: Former Smoker    Packs/day: 0.25    Years: 12.00    Pack years: 3.00  Types: Cigarettes    Last attempt to quit: 05/18/1966    Years since quitting: 51.0  . Smokeless tobacco: Never Used  . Tobacco comment: quit smoking 45 years ago  Substance Use Topics  . Alcohol use: No    Alcohol/week: 0.0 oz  . Drug use: No     Allergies   Patient has no known allergies.   Review of Systems Review of Systems  Constitutional: Negative for chills and fever.  HENT: Negative for ear pain and sore throat.   Eyes: Negative for pain and visual disturbance.  Respiratory: Negative for cough and shortness of breath.   Cardiovascular: Negative for chest pain and palpitations.  Gastrointestinal: Positive for abdominal pain, blood in stool and hematochezia. Negative for hematemesis and vomiting.  Genitourinary: Negative for dysuria and hematuria.  Musculoskeletal: Negative for arthralgias and back pain.  Skin: Negative for color change and rash.  Neurological: Negative for dizziness, seizures, loss of consciousness, syncope and light-headedness.  All other systems reviewed and are negative.    Physical Exam Updated Vital Signs SpO2 96% Comment: 2l/[place] all the time.  Physical Exam  Constitutional: He appears well-developed and well-nourished.  HENT:  Head: Normocephalic and atraumatic.  Eyes: Conjunctivae are normal.  Neck: Neck supple.  Cardiovascular: Normal rate and  regular rhythm.  Murmur heard.  Systolic murmur is present with a grade of 2/6. Pulmonary/Chest: Effort normal and breath sounds normal. No respiratory distress.  Abdominal: Soft. He exhibits no pulsatile midline mass and no mass. There is no tenderness.  Genitourinary: Rectal exam shows guaiac positive stool (red blood on glove). Rectal exam shows no tenderness.  Musculoskeletal: He exhibits no edema.  Neurological: He is alert.  Skin: Skin is warm and dry.  Psychiatric: He has a normal mood and affect.  Nursing note and vitals reviewed.    ED Treatments / Results  Labs (all labs ordered are listed, but only abnormal results are displayed) Labs Reviewed  CBC WITH DIFFERENTIAL/PLATELET - Abnormal; Notable for the following components:      Result Value   WBC 17.3 (*)    RBC 3.06 (*)    Hemoglobin 8.0 (*)    HCT 26.1 (*)    RDW 21.3 (*)    Platelets 497 (*)    Neutro Abs 11.1 (*)    Monocytes Absolute 1.6 (*)    Basophils Absolute 0.5 (*)    All other components within normal limits  COMPREHENSIVE METABOLIC PANEL - Abnormal; Notable for the following components:   Potassium 2.6 (*)    Glucose, Bld 164 (*)    BUN 22 (*)    Creatinine, Ser 1.28 (*)    Calcium 8.6 (*)    Total Protein 4.7 (*)    Albumin 2.3 (*)    ALT 16 (*)    Alkaline Phosphatase 138 (*)    GFR calc non Af Amer 53 (*)    All other components within normal limits  PROTIME-INR - Abnormal; Notable for the following components:   Prothrombin Time 15.3 (*)    All other components within normal limits  POC OCCULT BLOOD, ED - Abnormal; Notable for the following components:   Fecal Occult Bld POSITIVE (*)    All other components within normal limits  LIPASE, BLOOD  PROTIME-INR  VITAMIN B12  FOLATE  IRON AND TIBC  FERRITIN  RETICULOCYTES  BASIC METABOLIC PANEL  MAGNESIUM  I-STAT TROPONIN, ED  TYPE AND SCREEN  PREPARE RBC (CROSSMATCH)  PREPARE RBC (CROSSMATCH)    EKG  EKG  Interpretation  Date/Time:  Monday June 07 2017 09:35:24 EST Ventricular Rate:  75 PR Interval:    QRS Duration: 100 QT Interval:  454 QTC Calculation: 508 R Axis:   -17 Text Interpretation:  Sinus rhythm Prolonged PR interval Left ventricular hypertrophy Prolonged QT interval Baseline wander in lead(s) V2 Confirmed by Aletta Edouard [phone]) on 06/07/2017 9:39:18 AM       Radiology No results found.  Procedures Procedures (including critical care time)  Medications Ordered in ED Medications - No data to display   Initial Impression / Assessment and Plan / ED Course  I have reviewed the triage vital signs and the nursing notes.  Pertinent labs & imaging results that were available during my care of the patient were reviewed by me and considered in my medical decision making (see chart for details).  Clinical Course as of Jun 07 1853  Mon Jun 07, 2017  3491 After patient examined the patient felt like he needed to move his bowels again and passed about 300 cc of maroon jelly and thin blood.  He remains normotensive and asymptomatic.  [MB]  1107 Patient here with lower GI bleed.  He is never had this before and he is got no anticoagulation that I am aware of.  He continues to have active output here of red and maroon jelly stool.  He is more anemic than baseline and he has an elevated white count but that appears to be chronic for him.  His potassium is also critically low at 2.6.  I have consented him for blood transfusion and will need to replete his potassium.  He will need to be admitted to the hospital.  [MB]  1132 Discussed with GI on-call who will evaluate the patient while in the hospital.  The medicine hospitalist is paged for admission.  [MB]  7915 GI has evaluated the patient in the ED is asking me to order a CT angios of his abdomen due to the progression of his lower GI bleeding.  [MB]    Clinical Course User Index [MB] Hayden Rasmussen, MD    CRITICAL  CARE Performed by: Hayden Rasmussen   Total critical care time: 40 minutes  Critical care time was exclusive of separately billable procedures and treating other patients.  Critical care was necessary to treat or prevent imminent or life-threatening deterioration.  Critical care was time spent personally by me on the following activities: development of treatment plan with patient and/or surrogate as well as nursing, discussions with consultants, evaluation of patient's response to treatment, examination of patient, obtaining history from patient or surrogate, ordering and performing treatments and interventions, ordering and review of laboratory studies, ordering and review of radiographic studies, pulse oximetry and re-evaluation of patient's condition.  Patient with active GI bleed requiring transfusion, discussion with GI on-call, discussion with the admitting team, review of prior records and labs.  Patient also with hypokalemia requiring IV repletion.  Final Clinical Impressions(s) / ED Diagnoses   Final diagnoses:  Acute GI bleeding  Hypokalemia    ED Discharge Orders    None       Hayden Rasmussen, MD 06/07/17 [phone]

## 2017-06-07 NOTE — H&P (Signed)
History and Physical    Juan [name] MPN:[id number] DOB: 08-21-1940 DOA: 06/07/2017  PCP: Colon Branch, MD  Patient coming from: Rehab  Chief Complaint: Rectal bleeding  HPI: Juan [name] is a 77 y.o. male with medical history significant of coronary artery disease, chronic congestive diastolic heart failure, chronic kidney disease, myeloproliferative disorder, chronic lower extremity edema with recent hospitalization for UTI and pyelonephritis is in rehab since that hospitalization.  Patient reports he has been doing well with rehab.  He is up and moving around with a walker.  He states his chronic lower extremity edema is better than usual.  He denies any fevers.  No nausea vomiting diarrhea.  No abdominal pain.  This morning around 3 AM he had a jellylike bloody bowel movement.  He is again twice had bloody red bowel movements in the ED since arrival.  Patient has never had a colonoscopy before in the past.  He denies any rectal bleeding in the past.  He denies any rectal pain.  Patient is referred for admission for GI bleed.  His hemoglobin is 8 his baseline is around 10.  Again he is having no nausea vomiting.  Review of Systems: As per HPI otherwise 10 point review of systems negative.   Past Medical History:  Diagnosis Date  . Allergic rhinitis   . Anemia   . Ascending aortic aneurysm ([place]) 03/2014   4.3cm on CT scan  . CAD (coronary artery disease)    dx elsewheer in past, no documentation. Non-ischemic myoview 2007  . Chronic diastolic CHF (congestive heart failure), NYHA class 2 (HCC)    Normal EF w/ grade 1 dd by echo 12/2015  . Edema    R>L leg, u/s 5-12 neg for DVT  . Hemorrhoid   . History of thrombocytosis   . Hypertension   . Iron deficiency anemia due to chronic blood loss 03/31/2017  . 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 Migraine    "once/wk at least" (07/11/2013)  . Myeloproliferative disease ([place])   . On home oxygen therapy    "2L; all the time" (05/03/2017)  .  Pyelonephritis 04/29/2017  . Shortness of breath   . Sinus congestion   . Sleep apnea, obstructive    at some point used CPAP, was d/c  years ago  . Type II diabetes mellitus ([place])     Past Surgical History:  Procedure Laterality Date  . IR FLUORO GUIDE CV LINE RIGHT  05/07/2017  . IR FLUORO RM 30-60 MIN  05/07/2017  . IR US GUIDE VASC ACCESS RIGHT  05/07/2017  . TOE SURGERY Right    "tried to straighten out big toe" (07/11/2013)     reports that he quit smoking about 51 years ago. His smoking use included cigarettes. He has a 3.00 pack-year smoking history. he has never used smokeless tobacco. He reports that he does not drink alcohol or use drugs.  No Known Allergies  Family History  Problem Relation Age of Onset  . Schizophrenia Son   . Mental illness Daughter   . Colon cancer Neg Hx   . Prostate cancer Neg Hx   . Heart attack Neg Hx   . Diabetes Neg Hx     Prior to Admission medications   Medication Sig Start Date End Date Taking? Authorizing Provider  acetaminophen (TYLENOL) 325 MG tablet Take 2 tablets (650 mg total) by mouth every 6 (six) hours as needed for mild pain (or Fever >/= 101). 05/08/17   Mendel Corning, MD  aspirin 325 MG tablet Take 325 mg by mouth daily.    [provider]  b complex vitamins tablet Take 1 tablet by mouth daily.    [provider]  colchicine 0.6 MG tablet Take 1 tablet (0.6 mg total) by mouth daily. 05/08/17   Rai, Vernelle Emerald, MD  diclofenac sodium (VOLTAREN) 1 % GEL Apply 4 g topically 4 (four) times daily. Apply to bilateral  knees 05/08/17   Rai, Ripudeep K, MD  ferrous gluconate (FERGON) 240 (27 FE) MG tablet Take 240 mg by mouth daily.    [provider]  fluticasone (FLONASE) 50 MCG/ACT nasal spray Place 2 sprays into both nostrils daily. Reported on 05/02/2015    [provider]  furosemide (LASIX) 40 MG tablet Take 40 mg by mouth daily.    [provider]  gabapentin (NEURONTIN) 100 MG  capsule Take 2 capsules (200 mg total) by mouth 3 (three) times daily as needed (pain). Reported on 08/22/2015 05/08/17   Mendel Corning, MD  hydrALAZINE (APRESOLINE) 25 MG tablet Take 1 tablet (25 mg total) by mouth 3 (three) times daily. 02/09/17 05/13/17  Arbutus Leas, NP  hydrocerin (EUCERIN) CREA Apply Eucerin cream to BLE Q day after bathing and roughly towel drying to remove loose skin 05/08/17   Rai, Ripudeep K, MD  hydrOXYzine (ATARAX/VISTARIL) 10 MG tablet Take 1 tablet (10 mg total) by mouth every 4 (four) hours as needed for itching. 05/08/17   Rai, Vernelle Emerald, MD  OXYGEN Inhale 2 L into the lungs continuous.    [provider]  polyethylene glycol (MIRALAX / GLYCOLAX) packet Take 17 g by mouth daily as needed for mild constipation. 01/26/17   Debbe Odea, MD  tamsulosin (FLOMAX) 0.4 MG CAPS capsule Take 0.4 mg by mouth.    [provider]    Physical Exam: Vitals:   06/07/17 1045 06/07/17 1100 06/07/17 1115 06/07/17 1130  BP: (!) 158/67 (!) 145/86 (!) 152/78 (!) 171/64  Pulse: 64 (!) 59 68 62  Resp: 17 18 19 16   SpO2: 99% 99% 100% 99%  Weight:      Height:          Constitutional: NAD, calm, comfortable Vitals:   06/07/17 1045 06/07/17 1100 06/07/17 1115 06/07/17 1130  BP: (!) 158/67 (!) 145/86 (!) 152/78 (!) 171/64  Pulse: 64 (!) 59 68 62  Resp: 17 18 19 16   SpO2: 99% 99% 100% 99%  Weight:      Height:       Eyes: PERRL, lids and conjunctivae normal ENMT: Mucous membranes are moist. Posterior pharynx clear of any exudate or lesions.Normal dentition.  Neck: normal, supple, no masses, no thyromegaly Respiratory: clear to auscultation bilaterally, no wheezing, no crackles. Normal respiratory effort. No accessory muscle use.  Cardiovascular: Regular rate and rhythm, no murmurs / rubs / gallops.  2-3+ extremity edema. 2+ pedal pulses. No carotid bruits.  Abdomen: no tenderness, no masses palpated. No hepatosplenomegaly. Bowel sounds positive.    Musculoskeletal: no clubbing / cyanosis. No joint deformity upper and lower extremities. Good ROM, no contractures. Normal muscle tone.  Skin: no rashes, lesions, ulcers. No induration Neurologic: CN 2-12 grossly intact. Sensation intact, DTR normal. Strength 5/5 in all 4.  Psychiatric: Normal judgment and insight. Alert and oriented x 3. Normal mood.    Labs on Admission: I have personally reviewed following labs and imaging studies  CBC: Recent Labs  Lab 06/07/17 0916  WBC 17.3*  NEUTROABS 11.1*  HGB  8.0*  HCT 26.1*  MCV 85.3  PLT 211*   Basic Metabolic Panel: Recent Labs  Lab 06/07/17 0916  NA 143  K 2.6*  CL 109  CO2 25  GLUCOSE 164*  BUN 22*  CREATININE 1.28*  CALCIUM 8.6*   GFR: Estimated Creatinine Clearance: 59.6 mL/min (A) (by C-G formula based on SCr of 1.28 mg/dL (H)). Liver Function Tests: Recent Labs  Lab 06/07/17 0916  AST 21  ALT 16*  ALKPHOS 138*  BILITOT 0.6  PROT 4.7*  ALBUMIN 2.3*   Recent Labs  Lab 06/07/17 0916  LIPASE 20   No results for input(s): AMMONIA in the last 168 hours. Coagulation Profile: Recent Labs  Lab 06/07/17 0916  INR 1.22   Cardiac Enzymes: No results for input(s): CKTOTAL, CKMB, CKMBINDEX, TROPONINI in the last 168 hours. BNP (last 3 results) No results for input(s): PROBNP in the last 8760 hours. HbA1C: No results for input(s): HGBA1C in the last 72 hours. CBG: No results for input(s): GLUCAP in the last 168 hours. Lipid Profile: No results for input(s): CHOL, HDL, LDLCALC, TRIG, CHOLHDL, LDLDIRECT in the last 72 hours. Thyroid Function Tests: No results for input(s): TSH, T4TOTAL, FREET4, T3FREE, THYROIDAB in the last 72 hours. Anemia Panel: No results for input(s): VITAMINB12, FOLATE, FERRITIN, TIBC, IRON, RETICCTPCT in the last 72 hours. Urine analysis:    Component Value Date/Time   COLORURINE YELLOW 04/29/2017 1915   APPEARANCEUR CLOUDY (A) 04/29/2017 1915   LABSPEC 1.012 04/29/2017 1915    PHURINE 6.0 04/29/2017 1915   GLUCOSEU NEGATIVE 04/29/2017 1915   GLUCOSEU NEGATIVE 05/02/2015 1008   HGBUR SMALL (A) 04/29/2017 1915   BILIRUBINUR NEGATIVE 04/29/2017 [place] 04/29/2017 1915   PROTEINUR >=300 (A) 04/29/2017 1915   UROBILINOGEN 0.2 05/02/2015 1008   NITRITE POSITIVE (A) 04/29/2017 1915   LEUKOCYTESUR LARGE (A) 04/29/2017 1915   Sepsis Labs: !!!!!!!!!!!!!!!!!!!!!!!!!!!!!!!!!!!!!!!!!!!! @LABRCNTIP (procalcitonin:4,lacticidven:4) )No results found for this or any previous visit (from the past 240 hour(s)).   Radiological Exams on Admission: No results found.  Old chart reviewed Case discussed with EDP  Assessment/Plan 77 year old male with lower GI bleed and acute on chronic anemia  Principal Problem:   GIB (gastrointestinal bleeding)-we will transfuse 2 units of packed red blood cells slowly over 4 hours each.  Will give Lasix dose in between.  GI Dr. Collene Mares has been consulted and will see patient.  Will place on clear liquid diet for now.  Her abdominal exam is benign.  This is likely a diverticular bleed.  However he has never had a colonoscopy before per her report in the past.  Vital signs are stable at this time.  Active Problems:   Iron deficiency anemia due to chronic blood loss-with currently now acute blood loss.  Transfuse as above.  Check anemia panel panel prior to transfusion.  Patient normally sees hematology and gets [name] occasionally.    Myeloproliferative disease (HCC)-last [name] dose that he received was back in November.  He is currently with some acute blood loss.  His hemoglobin usually is around 10 over the last several months.  His white count is also usually elevated and is at his baseline.  He has no active signs of infection at this time.    Chronic diastolic CHF (congestive heart failure) (HCC)-continue home Lasix dose and give another additional dose in between his transfusion today.    CKD (chronic kidney disease) stage  3, GFR 30-59 ml/min (HCC)-stable at baseline    Generalized weakness-secondary to  above issues    Peripheral edema-patient reports this is better than usual still has quite a bit of edema.  Again continue his normal Lasix dosing and given additional dose between transfusion.    Paroxysmal SVT (supraventricular tachycardia) (HCC)-currently rate controlled    Pulmonary hypertension severe-noted   Holding all anticoagulation and antiplatelet medications in the setting of acute GI bleed.   DVT prophylaxis: SCDs Code Status: Full Family Communication: None Disposition Plan: Per day team Consults called: GI Dr. Collene Mares Admission status: Admission   Madiha Bambrick A MD [facility]  If 7PM-7AM, please contact night-coverage [email or web address] Password [facility]  06/07/2017, 12:09 PM

## 2017-06-08 ENCOUNTER — Encounter (HOSPITAL_COMMUNITY)
Admission: EM | Disposition: A | Discharge status: Home / Self Care | Payer: Self-pay | Source: Home / Self Care | Attending: Pulmonary Disease

## 2017-06-08 ENCOUNTER — Inpatient Hospital Stay (HOSPITAL_COMMUNITY): Payer: Medicare Other

## 2017-06-08 ENCOUNTER — Encounter (HOSPITAL_COMMUNITY): Payer: Self-pay | Admitting: *Deleted

## 2017-06-08 DIAGNOSIS — I959 Hypotension, unspecified: Secondary | ICD-10-CM

## 2017-06-08 DIAGNOSIS — K922 Gastrointestinal hemorrhage, unspecified: Secondary | ICD-10-CM

## 2017-06-08 DIAGNOSIS — R0902 Hypoxemia: Secondary | ICD-10-CM

## 2017-06-08 DIAGNOSIS — E876 Hypokalemia: Secondary | ICD-10-CM

## 2017-06-08 HISTORY — PX: FLEXIBLE SIGMOIDOSCOPY: SHX5431

## 2017-06-08 LAB — BASIC METABOLIC PANEL
ANION GAP: 11 (ref 5–15)
BUN: 22 mg/dL — AB (ref 6–20)
CO2: 22 mmol/L (ref 22–32)
Calcium: 8.5 mg/dL — ABNORMAL LOW (ref 8.9–10.3)
Chloride: 110 mmol/L (ref 101–111)
Creatinine, Ser: 1.11 mg/dL (ref 0.61–1.24)
Glucose, Bld: 117 mg/dL — ABNORMAL HIGH (ref 65–99)
POTASSIUM: 3.2 mmol/L — AB (ref 3.5–5.1)
SODIUM: 143 mmol/L (ref 135–145)

## 2017-06-08 LAB — IRON AND TIBC
IRON: 46 ug/dL (ref 45–182)
Saturation Ratios: 31 % (ref 17.9–39.5)
TIBC: 148 ug/dL — AB (ref 250–450)
UIBC: 102 ug/dL

## 2017-06-08 LAB — URINALYSIS, ROUTINE W REFLEX MICROSCOPIC
BILIRUBIN URINE: NEGATIVE
GLUCOSE, UA: NEGATIVE mg/dL
HGB URINE DIPSTICK: NEGATIVE
KETONES UR: NEGATIVE mg/dL
NITRITE: POSITIVE — AB
PROTEIN: 100 mg/dL — AB
Specific Gravity, Urine: 1.02 (ref 1.005–1.030)
Squamous Epithelial / LPF: NONE SEEN
pH: 5 (ref 5.0–8.0)

## 2017-06-08 LAB — CBC
HEMATOCRIT: 24.8 % — AB (ref 39.0–52.0)
HEMOGLOBIN: 7.8 g/dL — AB (ref 13.0–17.0)
MCH: 27.2 pg (ref 26.0–34.0)
MCHC: 31.5 g/dL (ref 30.0–36.0)
MCV: 86.4 fL (ref 78.0–100.0)
Platelets: 471 10*3/uL — ABNORMAL HIGH (ref 150–400)
RBC: 2.87 MIL/uL — AB (ref 4.22–5.81)
RDW: 19.3 % — ABNORMAL HIGH (ref 11.5–15.5)
WBC: 18.8 10*3/uL — AB (ref 4.0–10.5)

## 2017-06-08 LAB — MRSA PCR SCREENING: MRSA by PCR: NEGATIVE

## 2017-06-08 LAB — HEMOGLOBIN AND HEMATOCRIT, BLOOD
HCT: 25.5 % — ABNORMAL LOW (ref 39.0–52.0)
HCT: 26.2 % — ABNORMAL LOW (ref 39.0–52.0)
HEMATOCRIT: 23.5 % — AB (ref 39.0–52.0)
HEMOGLOBIN: 8.3 g/dL — AB (ref 13.0–17.0)
Hemoglobin: 8.5 g/dL — ABNORMAL LOW (ref 13.0–17.0)
Hemoglobin: 8.2 g/dL — ABNORMAL LOW (ref 13.0–17.0)

## 2017-06-08 LAB — MAGNESIUM: MAGNESIUM: 1.6 mg/dL — AB (ref 1.7–2.4)

## 2017-06-08 LAB — FERRITIN: Ferritin: 347 ng/mL — ABNORMAL HIGH (ref 24–336)

## 2017-06-08 LAB — PREPARE RBC (CROSSMATCH)

## 2017-06-08 LAB — PROTIME-INR
INR: 1.23
Prothrombin Time: 15.4 seconds — ABNORMAL HIGH (ref 11.4–15.2)

## 2017-06-08 LAB — RETICULOCYTES
RBC.: 2.88 MIL/uL — ABNORMAL LOW (ref 4.22–5.81)
Retic Count, Absolute: 77.8 10*3/uL (ref 19.0–186.0)
Retic Ct Pct: 2.7 % (ref 0.4–3.1)

## 2017-06-08 LAB — PROCALCITONIN: PROCALCITONIN: 0.27 ng/mL

## 2017-06-08 LAB — FOLATE: FOLATE: 21.8 ng/mL (ref >5.9)

## 2017-06-08 LAB — VITAMIN B12: VITAMIN B 12: 777 pg/mL (ref 180–914)

## 2017-06-08 SURGERY — SIGMOIDOSCOPY, FLEXIBLE
Anesthesia: Moderate Sedation

## 2017-06-08 MED ORDER — SODIUM CHLORIDE 0.9 % IV SOLN
Freq: Once | INTRAVENOUS | Status: AC
  Administered 2017-06-08: 09:00:00 via INTRAVENOUS

## 2017-06-08 MED ORDER — SODIUM CHLORIDE 0.9 % IV SOLN
Freq: Once | INTRAVENOUS | Status: AC
  Administered 2017-06-08: 04:00:00 via INTRAVENOUS

## 2017-06-08 MED ORDER — POTASSIUM CHLORIDE CRYS ER 20 MEQ PO TBCR
40.0000 meq | EXTENDED_RELEASE_TABLET | Freq: Once | ORAL | Status: AC
  Administered 2017-06-08: 40 meq via ORAL
  Filled 2017-06-08: qty 2

## 2017-06-08 MED ORDER — MIDAZOLAM HCL 5 MG/ML IJ SOLN
INTRAMUSCULAR | Status: AC
Start: 2017-06-08 — End: 2017-06-08
  Filled 2017-06-08: qty 2

## 2017-06-08 MED ORDER — PIPERACILLIN-TAZOBACTAM 3.375 G IVPB
3.3750 g | Freq: Three times a day (TID) | INTRAVENOUS | Status: DC
  Administered 2017-06-08 – 2017-06-09 (×2): 3.375 g via INTRAVENOUS
  Filled 2017-06-08 (×4): qty 50

## 2017-06-08 MED ORDER — TECHNETIUM TC 99M-LABELED RED BLOOD CELLS IV KIT
26.1000 | PACK | Freq: Once | INTRAVENOUS | Status: AC | PRN
  Administered 2017-06-08: 26.1 via INTRAVENOUS

## 2017-06-08 MED ORDER — MIDAZOLAM HCL 5 MG/5ML IJ SOLN
INTRAMUSCULAR | Status: DC | PRN
  Administered 2017-06-08: 1 mg via INTRAVENOUS
  Administered 2017-06-08: 2 mg via INTRAVENOUS

## 2017-06-08 MED ORDER — PEG 3350-KCL-NA BICARB-NACL 420 G PO SOLR
4000.0000 mL | Freq: Once | ORAL | Status: DC
  Filled 2017-06-08: qty 4000

## 2017-06-08 MED ORDER — VANCOMYCIN HCL IN DEXTROSE 1-5 GM/200ML-% IV SOLN
1000.0000 mg | Freq: Two times a day (BID) | INTRAVENOUS | Status: DC
  Filled 2017-06-08 (×2): qty 200

## 2017-06-08 MED ORDER — VANCOMYCIN HCL 10 G IV SOLR
1500.0000 mg | Freq: Once | INTRAVENOUS | Status: AC
  Administered 2017-06-08: 1500 mg via INTRAVENOUS
  Filled 2017-06-08: qty 1500

## 2017-06-08 MED ORDER — SODIUM CHLORIDE 0.9 % IV SOLN
INTRAVENOUS | Status: DC
  Administered 2017-06-08 (×2): via INTRAVENOUS

## 2017-06-08 MED ORDER — MAGNESIUM SULFATE 2 GM/50ML IV SOLN
2.0000 g | Freq: Once | INTRAVENOUS | Status: AC
  Administered 2017-06-08: 2 g via INTRAVENOUS
  Filled 2017-06-08: qty 50

## 2017-06-08 MED ORDER — MIDAZOLAM HCL 10 MG/2ML IJ SOLN
INTRAMUSCULAR | Status: DC | PRN
  Administered 2017-06-08: 1 mg via INTRAVENOUS

## 2017-06-08 MED ORDER — FENTANYL CITRATE (PF) 100 MCG/2ML IJ SOLN
INTRAMUSCULAR | Status: AC
  Filled 2017-06-08: qty 2

## 2017-06-08 MED ORDER — FENTANYL CITRATE (PF) 100 MCG/2ML IJ SOLN
INTRAMUSCULAR | Status: DC | PRN
  Administered 2017-06-08 (×4): 25 ug via INTRAVENOUS

## 2017-06-08 MED ORDER — PANTOPRAZOLE SODIUM 40 MG IV SOLR
40.0000 mg | Freq: Two times a day (BID) | INTRAVENOUS | Status: DC
  Administered 2017-06-08 – 2017-06-09 (×4): 40 mg via INTRAVENOUS
  Filled 2017-06-08 (×5): qty 40

## 2017-06-08 MED ORDER — DEXTROSE 5 % IV SOLN
1.0000 g | INTRAVENOUS | Status: DC
  Administered 2017-06-08: 1 g via INTRAVENOUS
  Filled 2017-06-08: qty 10

## 2017-06-08 MED ORDER — MIDAZOLAM HCL 10 MG/2ML IJ SOLN
INTRAMUSCULAR | Status: DC | PRN
  Administered 2017-06-08 (×3): 2 mg via INTRAVENOUS

## 2017-06-08 MED ORDER — SODIUM CHLORIDE 0.9 % IV SOLN: INTRAVENOUS | Status: DC

## 2017-06-08 NOTE — Progress Notes (Signed)
Spoke to coverage MD and she advised to contact Gastroenterology regarding pt loss of blood. Notified Gastroenterology and they advised they were not following him although  Pt was consulted by Collene Mares, MD. Notified rapid to examine pt.

## 2017-06-08 NOTE — Progress Notes (Signed)
Notified MD of Hgb of 7.8

## 2017-06-08 NOTE — Progress Notes (Signed)
CSW received call from [facility], SNF where patient was admitted from. They indicated plan was for patient to discharge home from their facility on 06/09/17 and wanted to know if patient wanted his bed held to continue rehab after this hospital admission. The facility had been unable to reach family at the phone numbers they have.  CSW met with patient at bedside. Patient alert and oriented. CSW discussed [place] request. Patient indicated he wants to return home after this hospital admission and does not want his bed at [facility] held. Patient stated his adult son lives with him and helps him at home. Patient has hospital bed and other medical equipment at home already. He lives in a first floor apartment and there are no steps.  CSW requested to update patient's emergency contact information, as patient's daughter's listed number is incorrect. Patient stated his daughter does not want her new phone number shared with anyone. CSW explained importance of having an updated contact, so family can be reached in the event of an emergency. Patient continued to decline to give updated phone number for daughter, and stated his son does not have a phone.   CSW signing off for now. Please consult if disposition needs change or other social issues arise during admission.  Estanislado Emms, [place]

## 2017-06-08 NOTE — Op Note (Signed)
[facility] Patient Name: Juan [name] Procedure Date : 06/08/2017 MRN: [id number] Attending MD: Carol Ada , MD Date of Birth: [birth year] CSN: [id number] Age: 77 Admit Type: Inpatient Procedure:                Flexible Sigmoidoscopy Indications:              Hematochezia, Abnormal abdominal x-ray of the GI                            tract Providers:                Carol Ada, MD, Kingsley Plan, RN, Alan Mulder, Technician Referring MD:              Medicines:                Fentanyl 100 micrograms IV, Midazolam 10 mg IV Complications:            No immediate complications. Estimated Blood Loss:     Estimated blood loss: none. Procedure:                Pre-Anesthesia Assessment:                           - Prior to the procedure, a History and Physical                            was performed, and patient medications and                            allergies were reviewed. The patient's tolerance of                            previous anesthesia was also reviewed. The risks                            and benefits of the procedure and the sedation                            options and risks were discussed with the patient.                            All questions were answered, and informed consent                            was obtained. Prior Anticoagulants: The patient has                            taken no previous anticoagulant or antiplatelet                            agents. ASA Grade Assessment: III - A patient with  severe systemic disease. After reviewing the risks                            and benefits, the patient was deemed in                            satisfactory condition to undergo the procedure.                           - Sedation was administered by an anesthesia                            professional. Deep sedation was attained.                           After obtaining informed consent,  the scope was                            passed under direct vision. The Endoscope was                            introduced through the anus and advanced to the the                            descending colon. After obtaining informed consent,                            the scope was passed under direct vision.The                            flexible sigmoidoscopy was performed with                            difficulty. The patient tolerated the procedure                            fairly well. The quality of the bowel preparation                            was adequate. Scope In: Scope Out: Findings:      Many small and large-mouthed diverticula were found in the sigmoid colon       and descending colon. To stop active bleeding, two hemostatic clips were       successfully placed (MR unsafe). There was no bleeding at the end of the       procedure.      There was dark blood and blood clots encountered. This was suctioned and       extensively lavaged. The endoscope was advanced to the junction of the       descending and sigmoid colon. In this region there was evidence of a       very fresh clot. The clot was suctioned and the area meticulously       lavaged. Active extravasation of blood was noted after removal of the       large clot, but the exact  site was not located. The first hemoclip, the       distal clip was placed without any benefit. A second hemoclip, placed       4-5 cm proximally was closer to the area of bleeding. After placement of       the second clip the area did not bleed again, however, I am NOT       confident that the actual vessel was ligated. I feel that the bleeding       arrested spontaneously and it may rebleed. This segment of colon was       flooded with water and prolonged observation was performed in order to       see if the bleeding would recur, but the rebleeding did not take place. Impression:               - Diverticulosis in the sigmoid colon and  in the                            descending colon. Clips (MR unsafe) were placed.                           - No specimens collected. Recommendation:           - Return patient to hospital ward for ongoing care.                           - NPO.                           - If bleeding recurs consult IR for embolization.                           - Follow HGB and transfuse as necessary. Procedure Code(s):        --- Professional ---                           [phone], Sigmoidoscopy, flexible; with control of                            bleeding, any method Diagnosis Code(s):        --- Professional ---                           K92.1, Melena (includes Hematochezia)                           K57.30, Diverticulosis of large intestine without                            perforation or abscess without bleeding                           R93.3, Abnormal findings on diagnostic imaging of                            other parts of digestive tract CPT copyright 2016 American Medical Association. All rights reserved. The codes documented in this report are preliminary  and upon coder review may  be revised to meet current compliance requirements. Carol Ada, MD Carol Ada, MD 06/08/2017 5:42:09 PM This report has been signed electronically. Number of Addenda: 0

## 2017-06-08 NOTE — Progress Notes (Signed)
Subjective: Reports hematochezia.  Objective: Vital signs in last 24 hours: Temp:  [97.9 F (36.6 C)-98.8 F (37.1 C)] 98.2 F (36.8 C) (01/22 2951) Pulse Rate:  [56-89] 76 (01/22 0632) Resp:  [8-22] 16 (01/22 8841) BP: (143-179)/(64-89) 143/69 (01/22 0632) SpO2:  [84 %-100 %] 99 % (01/22 0502) Weight:  [98.4 kg (217 lb)] 98.4 kg (217 lb) (01/21 0927) Last BM Date: 06/07/17  Intake/Output from previous day: 01/21 0701 - 01/22 0700 In: 1773.9 [I.V.:3; Blood:1470.9; IV Piggyback:300] Out: 2976 [Urine:2775; Stool:201] Intake/Output this shift: No intake/output data recorded.  General appearance: no distress and arousable from sleep GI: soft, non-tender; bowel sounds normal; no masses,  no organomegaly  Lab Results: Recent Labs    06/07/17 0916 06/08/17 0110  WBC 17.3* 18.8*  HGB 8.0* 7.8*  HCT 26.1* 24.8*  PLT 497* 471*   BMET Recent Labs    06/07/17 0916 06/08/17 0110  NA 143 143  K 2.6* 3.2*  CL 109 110  CO2 25 22  GLUCOSE 164* 117*  BUN 22* 22*  CREATININE 1.28* 1.11  CALCIUM 8.6* 8.5*   LFT Recent Labs    06/07/17 0916  PROT 4.7*  ALBUMIN 2.3*  AST 21  ALT 16*  ALKPHOS 138*  BILITOT 0.6   PT/INR Recent Labs    06/07/17 0916 06/08/17 0110  LABPROT 15.3* 15.4*  INR 1.22 1.23   Hepatitis Panel No results for input(s): HEPBSAG, HCVAB, HEPAIGM, HEPBIGM in the last 72 hours. C-Diff No results for input(s): CDIFFTOX in the last 72 hours. Fecal Lactopherrin No results for input(s): FECLLACTOFRN in the last 72 hours.  Studies/Results: Ct Angio Abd/pel W And/or Wo Contrast  Result Date: 06/07/2017 CLINICAL DATA:  morning around 3 AM he had a jellylike bloody bowel movement. He is again twice had bloody red bowel movements in the ED since arrival. Patient has never had a colonoscopy before in the past. He denies any rectal bleeding in the past. He denies any rectal pain. Patient is referred for admission for GI bleed. Patient also has h/o aneurysm  ascending aorta. EXAM: CTA ABDOMEN AND PELVIS WITH CONTRAST TECHNIQUE: Multidetector CT imaging of the abdomen and pelvis was performed using the standard protocol during bolus administration of intravenous contrast. Multiplanar reconstructed images and MIPs were obtained and reviewed to evaluate the vascular anatomy. CONTRAST:  129mL ISOVUE-370 IOPAMIDOL (ISOVUE-370) INJECTION 76% COMPARISON:  04/30/2017 FINDINGS: VASCULAR Aorta: Scattered calcified plaque. No aneurysm, dissection, or stenosis. Celiac: Patent without evidence of aneurysm, dissection, vasculitis or significant stenosis. SMA: Patent without evidence of aneurysm, dissection, vasculitis or significant stenosis. Renals: Single left, widely patent. Duplicated right, inferior dominant, both patent. IMA: Patent without evidence of aneurysm, dissection, vasculitis or significant stenosis. Inflow: Mild scattered calcified plaque. No aneurysm, dissection, or stenosis. Proximal Outflow: Bilateral common femoral and visualized portions of the superficial and profunda femoral arteries are patent without evidence of aneurysm, dissection, vasculitis or significant stenosis. Veins: Patent hepatic veins, portal vein, SMV, splenic vein, bilateral renal veins, IVC and iliac venous system. Review of the MIP images confirms the above findings. NON-VASCULAR Lower chest: No acute abnormality. Hepatobiliary: No focal liver abnormality is seen. No gallstones, gallbladder wall thickening, or biliary dilatation. Pancreas: Unremarkable. No pancreatic ductal dilatation or surrounding inflammatory changes. 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 No new lesion. Adrenals/Urinary Tract: Normal adrenals. 3 cm lower pole right renal cyst as before. Bilateral hydronephrosis and ureterectasis right greater than left, largely stable. Thick-walled urinary bladder with  bilateral diverticula right greater than left. Stomach/Bowel:  No evidence of active extravasation. Physiologic gastric distention. The small bowel is nondilated. Normal appendix. The colon is nondistended. Innumerable descending and sigmoid diverticula without significant adjacent inflammatory/edematous change evident. Rectal drain is in place. Lymphatic: No definite abdominal or pelvic adenopathy. Reproductive: Mild prostatic enlargement. Other: No ascites.  No free air. Musculoskeletal: There is extensive body wall edema. Diffuse sclerosis of visualized lower thoracic and lumbar spine, ribs, pelvis, and proximal femurs with multiple small lytic lucencies as before. Negative for fracture. IMPRESSION: VASCULAR 1. No evidence of active extravasation into the GI tract. 2. Aortoiliac atherosclerosis without aneurysm or stenosis. NON-VASCULAR 1. Descending and sigmoid diverticulosis. 2. Stable bilateral hydronephrosis and ureterectasis, with distended urinary bladder containing multiple diverticula, suggesting a degree of bladder outlet obstruction. 3. Diffuse osseous changes consistent with known myeloproliferative disorder. Electronically Signed   By: Lucrezia Europe M.D.   On: 06/07/2017 19:25    Medications:  Scheduled: . sodium chloride flush  3 mL Intravenous Q12H  . tamsulosin  0.4 mg Oral Daily   Continuous: . sodium chloride      Assessment/Plan: 1) Suspect diverticular bleed. 2) Anemia.   The patient is to have another 2 units of blood.  There was no change with the initial two units yesterday.  CTA was negative and a bleeding scan was ordered as nursing reported persistent hematochezia.  Plan: 1) Await bleeding scan. 2) Follow HGB.  LOS: 1 day   Yarely Bebee D 06/08/2017, 7:29 AM

## 2017-06-08 NOTE — Progress Notes (Signed)
I arrived in Nuclear med to take over monitoring of Juan [name] as he has not been transferred to 2 M as of yet. Patient is alert and oriented to person place and self .VSS and SWOT RN has completed blood transfusion.

## 2017-06-08 NOTE — Progress Notes (Signed)
SWOT RN back in nuclear med to stay and monitor patient

## 2017-06-08 NOTE — Progress Notes (Addendum)
0700 Bedside shift repot, pt resting in bed, blood done infusing. NAD, fall precautions in place, WCTM.   0745 Pt assessed, see flow sheet, pt having large amount of blood clots from rectum, cleaned up, MD at bedside, new orders received.   0800 Student RNs and teacher at bedside, attempting INT insert and urine culture and UA. Medicated per MAR. WCTM.   0900 Blood transfusing, NAD, pt upset he is now NPO, updated with POC.   1030 Pt resting comfortably, cleaned up again, multiple blood clots again present. VSS, NRS on tele, WCTM.  1100 Dr. Benson Norway paged regarding bleeding scan, orders received for bedside upper and lower endoscopy, orders to transfer to ICU. Pt receiving 4th unit of PRBC. VSS, WCTM  1145 CCM here assessing pt, report called to 82M. 5th unit of blood hung.   1200 Pt down to nuc med with Alyse Low, Therapist, sports from [employer] and [employer]. Pt then to [id number], with all belongings.

## 2017-06-08 NOTE — Progress Notes (Signed)
Notified MD of pt continuously bleeding from rectum with blood clots. 2 units of blood given and hgb dropped to 7.8.

## 2017-06-08 NOTE — Progress Notes (Signed)
Pharmacy Antibiotic Note  Juan [name] is a 77 y.o. male admitted on 06/07/2017 with [facility] pending GI eval / colonoscopy / EGD.  Pt was recently admitted / discharged (04/29/17 >> 05/08/17) with the plan to continue Cefepime at SNF through 12/29.  Pt noted to still have PICC line when presented to ED and concern for possible line sepsis.  Pharmacy has been consulted for Vancomycin and Zosyn dosing.  SCr 1.11 (nCrCl ~ 58)  Plan: Vanc 1500mg  IV x 1, then Vanc 1000mg  IV q12h Goal trough 15-20 mcg/ml Zosyn 3.375 gm IV q8h (4 hour infusion).  Height: 6\' 3"  (190.5 cm) Weight: 217 lb (98.4 kg) IBW/kg (Calculated) : 84.5  Temp (24hrs), Avg:98.3 F (36.8 C), Min:97.9 F (36.6 C), Max:98.8 F (37.1 C)  Recent Labs  Lab 06/07/17 0916 06/08/17 0110  WBC 17.3* 18.8*  CREATININE 1.28* 1.11    Estimated Creatinine Clearance: 68.7 mL/min (by C-G formula based on SCr of 1.11 mg/dL).    No Known Allergies  Antimicrobials this admission: Rocephin 1/22 x 1 Vanc 1/22 >>  Zosyn 1/22 >>  Dose adjustments this admission:   Microbiology results: 1/21 MRSA PCR negative 1/22 urine cx >> 1/22 blood x 2 >>  Thank you for allowing pharmacy to be a part of this patient's care.  [employer], Pharm.D., BCPS Clinical Pharmacist Pager: [phone] Clinical phone for 06/08/2017 from 8:30-4:00 is [phone]. After 4pm, please call Main Rx [phone]) for assistance. 06/08/2017 1:01 PM

## 2017-06-08 NOTE — Interval H&P Note (Signed)
History and Physical Interval Note:  06/08/2017 4:20 PM  Juan [name]  has presented today for surgery, with the diagnosis of Severe hematochezia  The various methods of treatment have been discussed with the patient and family. After consideration of risks, benefits and other options for treatment, the patient has consented to  Procedure(s): COLONOSCOPY (Left) ESOPHAGOGASTRODUODENOSCOPY (EGD) (Left) as a surgical intervention .  The patient's history has been reviewed, patient examined, no change in status, stable for surgery.  I have reviewed the patient's chart and labs.  Questions were answered to the patient's satisfaction.     Toniesha Zellner D

## 2017-06-08 NOTE — Progress Notes (Signed)
Received patient to 2M03 at 1600 and then Endoscopy called and came to to take patient to Endo via bed with patient's son at bedside with RN

## 2017-06-08 NOTE — Progress Notes (Addendum)
PROGRESS NOTE    Kemon Devincenzi  ZHY:[id number] DOB: Feb 04, 1941 DOA: 06/07/2017 PCP: Colon Branch, MD    Brief Narrative: Juan [name] is a 77 y.o. male with medical history significant of coronary artery disease, chronic congestive diastolic heart failure, chronic kidney disease, myeloproliferative disorder, chronic lower extremity edema with recent hospitalization for UTI and pyelonephritis is in rehab since that hospitalization.  Patient reports he has been doing well with rehab.  He is up and moving around with a walker.  He states his chronic lower extremity edema is better than usual.  He denies any fevers.  No nausea vomiting diarrhea.  No abdominal pain.  This morning around 3 AM he had a jellylike bloody bowel movement.  He is again twice had bloody red bowel movements in the ED since arrival.  Patient has never had a colonoscopy before in the past.  He denies any rectal bleeding in the past.  He denies any rectal pain.  Patient is referred for admission for GI bleed.  His hemoglobin is 8 his baseline is around 10.  Again he is having no nausea vomiting.     Assessment & Plan:   Principal Problem:   Acute GI bleeding Active Problems:   Myeloproliferative disease (HCC)   Chronic diastolic CHF (congestive heart failure) (HCC)   CKD (chronic kidney disease) stage 3, GFR 30-59 ml/min (HCC)   Generalized weakness   Peripheral edema   Paroxysmal SVT (supraventricular tachycardia) (HCC)   Pulmonary hypertension severe   Iron deficiency anemia due to chronic blood loss   Hypokalemia  1-GI bleed, hematochezia: Holding aspirin.  CT angio abdomen pelvis; no active extravasation of blood in GI tract. Diverticulosis.  INR 1.1 Still having clots, bleeding per rectum. Had multiples episodes overnight and this am.  He has received  3 units PRBC. He is to received another unit of blood . Will order to transfuse another unit.  Nurse will call nuclear scan to see if we can get scan soon.  Two large  bore IV ordered.  IV protonix.  Start IV fluids.  Repeated BP at 150.  Change status to step down, low threshold to transfer to ICU.  NPO  Hb every 8 hours.   2-Acute blood loss anemia, iron deficiency anemia history off. Myeloproliferative disorder Gets [name] occasionally by hematology/  See number one.   3-Chronic Diastolic HF;  Hold Hydralazine in setting of GI bleed.  Lasix PRN in between transfusion.   CKD stage III Stable monitor.   Pulmonary HTN;   Hypokalemia;  Received 40 meq KCL.   Leukocytosis; CT;  Stable Bilateral hydronephrosis and distended bladder with diverticula. Grade of bladder obstruction.  Check UA, prior history of pyelonephritis.  Start ceftriaxone to cover for presume UTI.  Need urology follow up.  Check blood cultures.  Patient has central line that was supposse to be removed at his rehab. Will remove  central line after we get IV access.   DVT prophylaxis: SCD Code Status: Full code.  Family Communication: D/W patient.  Disposition Plan: Back to SNF when stable.   Consultants:   Dr Benson Norway Dr Collene Mares  Procedures:  nuclear scan.     Antimicrobials: Ceftriaxone.    Subjective: He is alert, denies chest pain or dyspnea.  denies abdominal pain, having multiples bloody BM, multiples clots.   Objective: Vitals:   06/08/17 0343 06/08/17 0417 06/08/17 0502 06/08/17 0632  BP: (!) 144/66 (!) 152/74 (!) 148/68 (!) 143/69  Pulse: 78 81 75 76  Resp: 16 18 18 16   Temp: 98.7 F (37.1 C) 98.7 F (37.1 C) 97.9 F (36.6 C) 98.2 F (36.8 C)  TempSrc: Oral Oral Oral Oral  SpO2: 97%  99%   Weight:      Height:        Intake/Output Summary (Last 24 hours) at 06/08/2017 0804 Last data filed at 06/08/2017 5621 Gross per 24 hour  Intake 1773.9 ml  Output 2976 ml  Net -1202.1 ml   Filed Weights   06/07/17 0927  Weight: 98.4 kg (217 lb)    Examination:  General exam: Appears calm and comfortable  Respiratory system: Clear to  auscultation. Respiratory effort normal. Cardiovascular system: S1 & S2 heard, RRR. No JVD, murmurs, rubs, gallops or clicks. No pedal edema. Gastrointestinal system: Abdomen is nondistended, soft and nontender. No organomegaly or masses felt. Normal bowel sounds heard. Central nervous system: Alert and oriented. No focal neurological deficits. Extremities: Symmetric 5 x 5 power. Skin: No rashes, lesions or ulcers    Data Reviewed: I have personally reviewed following labs and imaging studies  CBC: Recent Labs  Lab 06/07/17 0916 06/08/17 0110  WBC 17.3* 18.8*  NEUTROABS 11.1*  --   HGB 8.0* 7.8*  HCT 26.1* 24.8*  MCV 85.3 86.4  PLT 497* 308*   Basic Metabolic Panel: Recent Labs  Lab 06/07/17 0916 06/08/17 0110  NA 143 143  K 2.6* 3.2*  CL 109 110  CO2 25 22  GLUCOSE 164* 117*  BUN 22* 22*  CREATININE 1.28* 1.11  CALCIUM 8.6* 8.5*  MG  --  1.6*   GFR: Estimated Creatinine Clearance: 68.7 mL/min (by C-G formula based on SCr of 1.11 mg/dL). Liver Function Tests: Recent Labs  Lab 06/07/17 0916  AST 21  ALT 16*  ALKPHOS 138*  BILITOT 0.6  PROT 4.7*  ALBUMIN 2.3*   Recent Labs  Lab 06/07/17 0916  LIPASE 20   No results for input(s): AMMONIA in the last 168 hours. Coagulation Profile: Recent Labs  Lab 06/07/17 0916 06/08/17 0110  INR 1.22 1.23   Cardiac Enzymes: No results for input(s): CKTOTAL, CKMB, CKMBINDEX, TROPONINI in the last 168 hours. BNP (last 3 results) No results for input(s): PROBNP in the last 8760 hours. HbA1C: No results for input(s): HGBA1C in the last 72 hours. CBG: No results for input(s): GLUCAP in the last 168 hours. Lipid Profile: No results for input(s): CHOL, HDL, LDLCALC, TRIG, CHOLHDL, LDLDIRECT in the last 72 hours. Thyroid Function Tests: No results for input(s): TSH, T4TOTAL, FREET4, T3FREE, THYROIDAB in the last 72 hours. Anemia Panel: Recent Labs    06/08/17 0110  FOLATE 21.8  FERRITIN 347*  TIBC 148*  IRON 46   RETICCTPCT 2.7   Sepsis Labs: No results for input(s): PROCALCITON, LATICACIDVEN in the last 168 hours.  Recent Results (from the past 240 hour(s))  MRSA PCR Screening     Status: None   Collection Time: 06/07/17 11:08 PM  Result Value Ref Range Status   MRSA by PCR NEGATIVE NEGATIVE Final    Comment:        The GeneXpert MRSA Assay (FDA approved for NASAL specimens only), is one component of a comprehensive MRSA colonization surveillance program. It is not intended to diagnose MRSA infection nor to guide or monitor treatment for MRSA infections.          Radiology Studies: Ct Angio Abd/pel W And/or Wo Contrast  Result Date: 06/07/2017 CLINICAL DATA:  morning around 3 AM he had a jellylike  bloody bowel movement. He is again twice had bloody red bowel movements in the ED since arrival. Patient has never had a colonoscopy before in the past. He denies any rectal bleeding in the past. He denies any rectal pain. Patient is referred for admission for GI bleed. Patient also has h/o aneurysm ascending aorta. EXAM: CTA ABDOMEN AND PELVIS WITH CONTRAST TECHNIQUE: Multidetector CT imaging of the abdomen and pelvis was performed using the standard protocol during bolus administration of intravenous contrast. Multiplanar reconstructed images and MIPs were obtained and reviewed to evaluate the vascular anatomy. CONTRAST:  162mL ISOVUE-370 IOPAMIDOL (ISOVUE-370) INJECTION 76% COMPARISON:  04/30/2017 FINDINGS: VASCULAR Aorta: Scattered calcified plaque. No aneurysm, dissection, or stenosis. Celiac: Patent without evidence of aneurysm, dissection, vasculitis or significant stenosis. SMA: Patent without evidence of aneurysm, dissection, vasculitis or significant stenosis. Renals: Single left, widely patent. Duplicated right, inferior dominant, both patent. IMA: Patent without evidence of aneurysm, dissection, vasculitis or significant stenosis. Inflow: Mild scattered calcified plaque. No aneurysm,  dissection, or stenosis. Proximal Outflow: Bilateral common femoral and visualized portions of the superficial and profunda femoral arteries are patent without evidence of aneurysm, dissection, vasculitis or significant stenosis. Veins: Patent hepatic veins, portal vein, SMV, splenic vein, bilateral renal veins, IVC and iliac venous system. Review of the MIP images confirms the above findings. NON-VASCULAR Lower chest: No acute abnormality. Hepatobiliary: No focal liver abnormality is seen. No gallstones, gallbladder wall thickening, or biliary dilatation. Pancreas: Unremarkable. No pancreatic ductal dilatation or surrounding inflammatory changes. 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 No new lesion. Adrenals/Urinary Tract: Normal adrenals. 3 cm lower pole right renal cyst as before. Bilateral hydronephrosis and ureterectasis right greater than left, largely stable. Thick-walled urinary bladder with bilateral diverticula right greater than left. Stomach/Bowel: No evidence of active extravasation. Physiologic gastric distention. The small bowel is nondilated. Normal appendix. The colon is nondistended. Innumerable descending and sigmoid diverticula without significant adjacent inflammatory/edematous change evident. Rectal drain is in place. Lymphatic: No definite abdominal or pelvic adenopathy. Reproductive: Mild prostatic enlargement. Other: No ascites.  No free air. Musculoskeletal: There is extensive body wall edema. Diffuse sclerosis of visualized lower thoracic and lumbar spine, ribs, pelvis, and proximal femurs with multiple small lytic lucencies as before. Negative for fracture. IMPRESSION: VASCULAR 1. No evidence of active extravasation into the GI tract. 2. Aortoiliac atherosclerosis without aneurysm or stenosis. NON-VASCULAR 1. Descending and sigmoid diverticulosis. 2. Stable bilateral hydronephrosis and ureterectasis, with distended urinary  bladder containing multiple diverticula, suggesting a degree of bladder outlet obstruction. 3. Diffuse osseous changes consistent with known myeloproliferative disorder. Electronically Signed   By: Lucrezia Europe M.D.   On: 06/07/2017 19:25        Scheduled Meds: . sodium chloride flush  3 mL Intravenous Q12H  . tamsulosin  0.4 mg Oral Daily   Continuous Infusions: . sodium chloride       LOS: 1 day    Time spent: 35 minutes.     Elmarie Shiley, MD [facility] Pager [phone]  If 7PM-7AM, please contact night-coverage [email or web address] Password [facility] 06/08/2017, 8:04 AM

## 2017-06-08 NOTE — H&P (View-Only) (Signed)
Subjective: Reports hematochezia.  Objective: Vital signs in last 24 hours: Temp:  [97.9 F (36.6 C)-98.8 F (37.1 C)] 98.2 F (36.8 C) (01/22 3329) Pulse Rate:  [56-89] 76 (01/22 0632) Resp:  [8-22] 16 (01/22 5188) BP: (143-179)/(64-89) 143/69 (01/22 0632) SpO2:  [84 %-100 %] 99 % (01/22 0502) Weight:  [98.4 kg (217 lb)] 98.4 kg (217 lb) (01/21 0927) Last BM Date: 06/07/17  Intake/Output from previous day: 01/21 0701 - 01/22 0700 In: 1773.9 [I.V.:3; Blood:1470.9; IV Piggyback:300] Out: 2976 [Urine:2775; Stool:201] Intake/Output this shift: No intake/output data recorded.  General appearance: no distress and arousable from sleep GI: soft, non-tender; bowel sounds normal; no masses,  no organomegaly  Lab Results: Recent Labs    06/07/17 0916 06/08/17 0110  WBC 17.3* 18.8*  HGB 8.0* 7.8*  HCT 26.1* 24.8*  PLT 497* 471*   BMET Recent Labs    06/07/17 0916 06/08/17 0110  NA 143 143  K 2.6* 3.2*  CL 109 110  CO2 25 22  GLUCOSE 164* 117*  BUN 22* 22*  CREATININE 1.28* 1.11  CALCIUM 8.6* 8.5*   LFT Recent Labs    06/07/17 0916  PROT 4.7*  ALBUMIN 2.3*  AST 21  ALT 16*  ALKPHOS 138*  BILITOT 0.6   PT/INR Recent Labs    06/07/17 0916 06/08/17 0110  LABPROT 15.3* 15.4*  INR 1.22 1.23   Hepatitis Panel No results for input(s): HEPBSAG, HCVAB, HEPAIGM, HEPBIGM in the last 72 hours. C-Diff No results for input(s): CDIFFTOX in the last 72 hours. Fecal Lactopherrin No results for input(s): FECLLACTOFRN in the last 72 hours.  Studies/Results: Ct Angio Abd/pel W And/or Wo Contrast  Result Date: 06/07/2017 CLINICAL DATA:  morning around 3 AM he had a jellylike bloody bowel movement. He is again twice had bloody red bowel movements in the ED since arrival. Patient has never had a colonoscopy before in the past. He denies any rectal bleeding in the past. He denies any rectal pain. Patient is referred for admission for GI bleed. Patient also has h/o aneurysm  ascending aorta. EXAM: CTA ABDOMEN AND PELVIS WITH CONTRAST TECHNIQUE: Multidetector CT imaging of the abdomen and pelvis was performed using the standard protocol during bolus administration of intravenous contrast. Multiplanar reconstructed images and MIPs were obtained and reviewed to evaluate the vascular anatomy. CONTRAST:  165mL ISOVUE-370 IOPAMIDOL (ISOVUE-370) INJECTION 76% COMPARISON:  04/30/2017 FINDINGS: VASCULAR Aorta: Scattered calcified plaque. No aneurysm, dissection, or stenosis. Celiac: Patent without evidence of aneurysm, dissection, vasculitis or significant stenosis. SMA: Patent without evidence of aneurysm, dissection, vasculitis or significant stenosis. Renals: Single left, widely patent. Duplicated right, inferior dominant, both patent. IMA: Patent without evidence of aneurysm, dissection, vasculitis or significant stenosis. Inflow: Mild scattered calcified plaque. No aneurysm, dissection, or stenosis. Proximal Outflow: Bilateral common femoral and visualized portions of the superficial and profunda femoral arteries are patent without evidence of aneurysm, dissection, vasculitis or significant stenosis. Veins: Patent hepatic veins, portal vein, SMV, splenic vein, bilateral renal veins, IVC and iliac venous system. Review of the MIP images confirms the above findings. NON-VASCULAR Lower chest: No acute abnormality. Hepatobiliary: No focal liver abnormality is seen. No gallstones, gallbladder wall thickening, or biliary dilatation. Pancreas: Unremarkable. No pancreatic ductal dilatation or surrounding inflammatory changes. 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 No new lesion. Adrenals/Urinary Tract: Normal adrenals. 3 cm lower pole right renal cyst as before. Bilateral hydronephrosis and ureterectasis right greater than left, largely stable. Thick-walled urinary bladder with  bilateral diverticula right greater than left. Stomach/Bowel:  No evidence of active extravasation. Physiologic gastric distention. The small bowel is nondilated. Normal appendix. The colon is nondistended. Innumerable descending and sigmoid diverticula without significant adjacent inflammatory/edematous change evident. Rectal drain is in place. Lymphatic: No definite abdominal or pelvic adenopathy. Reproductive: Mild prostatic enlargement. Other: No ascites.  No free air. Musculoskeletal: There is extensive body wall edema. Diffuse sclerosis of visualized lower thoracic and lumbar spine, ribs, pelvis, and proximal femurs with multiple small lytic lucencies as before. Negative for fracture. IMPRESSION: VASCULAR 1. No evidence of active extravasation into the GI tract. 2. Aortoiliac atherosclerosis without aneurysm or stenosis. NON-VASCULAR 1. Descending and sigmoid diverticulosis. 2. Stable bilateral hydronephrosis and ureterectasis, with distended urinary bladder containing multiple diverticula, suggesting a degree of bladder outlet obstruction. 3. Diffuse osseous changes consistent with known myeloproliferative disorder. Electronically Signed   By: Lucrezia Europe M.D.   On: 06/07/2017 19:25    Medications:  Scheduled: . sodium chloride flush  3 mL Intravenous Q12H  . tamsulosin  0.4 mg Oral Daily   Continuous: . sodium chloride      Assessment/Plan: 1) Suspect diverticular bleed. 2) Anemia.   The patient is to have another 2 units of blood.  There was no change with the initial two units yesterday.  CTA was negative and a bleeding scan was ordered as nursing reported persistent hematochezia.  Plan: 1) Await bleeding scan. 2) Follow HGB.  LOS: 1 day   Mariaeduarda Defranco D 06/08/2017, 7:29 AM

## 2017-06-08 NOTE — Consult Note (Signed)
PULMONARY / CRITICAL CARE MEDICINE   Name: Juan [name] MRN: [id number] DOB: 1941/01/14    ADMISSION DATE:  06/07/2017 CONSULTATION DATE:  06/08/2017  REFERRING MD:  Greig Castilla  CHIEF COMPLAINT:  GI Bleeding  HISTORY OF PRESENT ILLNESS:   77 year old male with extensive PMH who presents to the hospital with the chief complaint of BRBPR, patient was transfused 5 units pRBC so far, Hg is stable but continues to bleed.  He has no other complaints other than being hungry and thirsty.  Denies SOB, N/V, dizziness, palpitation, chest pain, melena or hematemesis.  Patient was recently admitted for sepsis and was sent home with a tunneled IJ for IV abx but arrived to the hospital with the line not even dressed.  GI asked for patient to be transferred to the ICU for endoscopy studies and IR is aware of patient.  PCCM asked to assume care.  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  PAST SURGICAL HISTORY: He  has a past surgical history that includes Toe Surgery (Right); IR US Guide Vasc Access Right (05/07/2017); IR Fluoro Guide CV Line Right (05/07/2017); and IR Fluoro Rm 30-60 Min (05/07/2017).  No Known Allergies  No current facility-administered medications on file prior to encounter.    Current Outpatient Medications on File Prior to Encounter  Medication Sig  . acetaminophen (TYLENOL) 325 MG tablet Take 2 tablets (650 mg total) by mouth every 6 (six) hours as  needed for mild pain (or Fever >/= 101).  Marland Kitchen aspirin 325 MG tablet Take 325 mg by mouth daily.  Marland Kitchen b complex vitamins tablet Take 1 tablet by mouth daily.  . colchicine 0.6 MG tablet Take 1 tablet (0.6 mg total) by mouth daily.  . diclofenac sodium (VOLTAREN) 1 % GEL Apply 4 g topically 4 (four) times daily. Apply to bilateral  knees  . ferrous gluconate (FERGON) 240 (27 FE) MG tablet Take 240 mg by mouth daily.  . fluticasone (FLONASE) 50 MCG/ACT nasal spray Place 2 sprays into both nostrils daily. Reported on 05/02/2015  . furosemide (LASIX) 40 MG tablet Take 40 mg by mouth daily.  Marland Kitchen gabapentin (NEURONTIN) 100 MG capsule Take 2 capsules (200 mg total) by mouth 3 (three) times daily as needed (pain). Reported on 08/22/2015  . hydrALAZINE (APRESOLINE) 25 MG tablet Take 1 tablet (25 mg total) by mouth 3 (three) times daily.  . hydrocerin (EUCERIN) CREA Apply Eucerin cream to BLE Q day after bathing and roughly towel drying to remove loose skin  . hydrOXYzine (ATARAX/VISTARIL) 10 MG tablet Take 1 tablet (10 mg total) by mouth every 4 (four) hours as needed for itching.  . OXYGEN Inhale 2 L into the lungs continuous.  . tamsulosin (FLOMAX) 0.4 MG CAPS capsule Take 0.4 mg by mouth.  . polyethylene glycol (MIRALAX / GLYCOLAX) packet Take 17 g by mouth daily as needed for mild constipation. (Patient not taking: Reported on 06/07/2017)    FAMILY HISTORY:  His indicated that his mother is deceased. He indicated that his father is deceased. He indicated that his brother is alive. He indicated  that his maternal grandmother is deceased. He indicated that his maternal grandfather is deceased. He indicated that his paternal grandmother is deceased. He indicated that his paternal grandfather is deceased. He indicated that his daughter is alive. He indicated that his son is alive. He indicated that the status of his neg hx is unknown.   SOCIAL HISTORY: He  reports that he quit smoking about 51 years ago. His  smoking use included cigarettes. He has a 3.00 pack-year smoking history. he has never used smokeless tobacco. He reports that he does not drink alcohol or use drugs.  REVIEW OF SYSTEMS:   Mild confusion, unable to attain a full ROS  SUBJECTIVE:  Hungry and thirsty  VITAL SIGNS: BP 131/69   Pulse 70   Temp 97.9 F (36.6 C) (Oral)   Resp 17   Ht 6\' 3"  (1.905 m)   Wt 98.4 kg (217 lb)   SpO2 100%   BMI 27.12 kg/m   HEMODYNAMICS:    VENTILATOR SETTINGS:    INTAKE / OUTPUT: I/O last 3 completed shifts: In: 1773.9 [I.V.:3; Blood:1470.9; IV Piggyback:300] Out: 2976 [Urine:2775; Stool:201]  PHYSICAL EXAMINATION: General:  Chronically ill appearing male, NAD Neuro:  Alert and oriented to self and place but not time.  Moving all ext to commands. HEENT:  [place]/AT, PERRL, EOM-I and MMM Cardiovascular:  RRR, Nl S1/S2 and -M/R/G. Lungs:  CTA bilaterally Abdomen:  Soft, NT, ND and +BS Musculoskeletal:  -edema and -tenderness Skin:  Point tenderness and erythema at the right tunnled IJ line otherwise intact.  LABS:  BMET Recent Labs  Lab 06/07/17 0916 06/08/17 0110  NA 143 143  K 2.6* 3.2*  CL 109 110  CO2 25 22  BUN 22* 22*  CREATININE 1.28* 1.11  GLUCOSE 164* 117*    Electrolytes Recent Labs  Lab 06/07/17 0916 06/08/17 0110  CALCIUM 8.6* 8.5*  MG  --  1.6*    CBC Recent Labs  Lab 06/07/17 0916 06/08/17 0110  WBC 17.3* 18.8*  HGB 8.0* 7.8*  HCT 26.1* 24.8*  PLT 497* 471*    Coag's Recent Labs  Lab 06/07/17 0916 06/08/17 0110  INR 1.22 1.23    Sepsis Markers No results for input(s): LATICACIDVEN, PROCALCITON, O2SATVEN in the last 168 hours.  ABG No results for input(s): PHART, PCO2ART, PO2ART in the last 168 hours.  Liver Enzymes Recent Labs  Lab 06/07/17 0916  AST 21  ALT 16*  ALKPHOS 138*  BILITOT 0.6  ALBUMIN 2.3*    Cardiac Enzymes No results for input(s): TROPONINI, PROBNP in the last 168 hours.  Glucose No results for  input(s): GLUCAP in the last 168 hours.  Imaging Ct Angio Abd/pel W And/or Wo Contrast  Result Date: 06/07/2017 CLINICAL DATA:  morning around 3 AM he had a jellylike bloody bowel movement. He is again twice had bloody red bowel movements in the ED since arrival. Patient has never had a colonoscopy before in the past. He denies any rectal bleeding in the past. He denies any rectal pain. Patient is referred for admission for GI bleed. Patient also has h/o aneurysm ascending aorta. EXAM: CTA ABDOMEN AND PELVIS WITH CONTRAST TECHNIQUE: Multidetector CT imaging of the abdomen and pelvis was performed using the standard protocol during bolus administration of intravenous contrast. Multiplanar reconstructed images and MIPs were obtained and reviewed to evaluate the vascular anatomy. CONTRAST:  146mL ISOVUE-370 IOPAMIDOL (ISOVUE-370) INJECTION 76% COMPARISON:  04/30/2017 FINDINGS: VASCULAR Aorta: Scattered calcified plaque. No aneurysm, dissection, or stenosis. Celiac: Patent  without evidence of aneurysm, dissection, vasculitis or significant stenosis. SMA: Patent without evidence of aneurysm, dissection, vasculitis or significant stenosis. Renals: Single left, widely patent. Duplicated right, inferior dominant, both patent. IMA: Patent without evidence of aneurysm, dissection, vasculitis or significant stenosis. Inflow: Mild scattered calcified plaque. No aneurysm, dissection, or stenosis. Proximal Outflow: Bilateral common femoral and visualized portions of the superficial and profunda femoral arteries are patent without evidence of aneurysm, dissection, vasculitis or significant stenosis. Veins: Patent hepatic veins, portal vein, SMV, splenic vein, bilateral renal veins, IVC and iliac venous system. Review of the MIP images confirms the above findings. NON-VASCULAR Lower chest: No acute abnormality. Hepatobiliary: No focal liver abnormality is seen. No gallstones, gallbladder wall thickening, or biliary dilatation.  Pancreas: Unremarkable. No pancreatic ductal dilatation or surrounding inflammatory changes. 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 No new lesion. Adrenals/Urinary Tract: Normal adrenals. 3 cm lower pole right renal cyst as before. Bilateral hydronephrosis and ureterectasis right greater than left, largely stable. Thick-walled urinary bladder with bilateral diverticula right greater than left. Stomach/Bowel: No evidence of active extravasation. Physiologic gastric distention. The small bowel is nondilated. Normal appendix. The colon is nondistended. Innumerable descending and sigmoid diverticula without significant adjacent inflammatory/edematous change evident. Rectal drain is in place. Lymphatic: No definite abdominal or pelvic adenopathy. Reproductive: Mild prostatic enlargement. Other: No ascites.  No free air. Musculoskeletal: There is extensive body wall edema. Diffuse sclerosis of visualized lower thoracic and lumbar spine, ribs, pelvis, and proximal femurs with multiple small lytic lucencies as before. Negative for fracture. IMPRESSION: VASCULAR 1. No evidence of active extravasation into the GI tract. 2. Aortoiliac atherosclerosis without aneurysm or stenosis. NON-VASCULAR 1. Descending and sigmoid diverticulosis. 2. Stable bilateral hydronephrosis and ureterectasis, with distended urinary bladder containing multiple diverticula, suggesting a degree of bladder outlet obstruction. 3. Diffuse osseous changes consistent with known myeloproliferative disorder. Electronically Signed   By: Lucrezia Europe M.D.   On: 06/07/2017 19:25     STUDIES:  1/22 GI endoscopy  CULTURES: Blood 1/22>>> Urine 1/22>>>  ANTIBIOTICS: Rocephin 1/22>>>1/22 Zosyn 1/22>>> Vancomycin 1/22>>>  SIGNIFICANT EVENTS: 1/22>>>transfer to the ICU at GI request for endoscopy  LINES/TUBES: R IJ Tunneled catheter unknown placement date (last admission)>>>1/22  I  reviewed CT myself, no clues to bleeding noted on CT  DISCUSSION: 77 year old male with PMH above presenting to ICU for endoscopy and continuous GI bleeding with concern for line sepsis.  Discussed with PCCM-NP.  ASSESSMENT / PLAN:  PULMONARY A: Hypoxemia P:   - Titrate O2 for sat of 88-92% - Monitor closely for airway protection when patient is undergoing sedation.  CARDIOVASCULAR A:  Sinus tach Borderline BP due to hemorrhage P:  - Transfuse - Tele monitoring - IVF resuscitation  RENAL A:   Hypokalemia P:   - Replace electrolytes as indicated - BMET in AM - NS 100 ml/hr  GASTROINTESTINAL A:   GI bleeding P:   - GI to endoscope - Transfuse per ICU protocol  HEMATOLOGIC A:   Myelodysplastic syndrome P:  - CBC - Transfuse per ICU protocol  INFECTIOUS A:   ?line sepsis P:   - Remove line - Broad spectrum abx - PCT  ENDOCRINE A:   No active issues   P:   - Monitor  NEUROLOGIC A:   AMS P:   - Minimize sedation - Observe while undergoing conscious sedation  FAMILY  - Updates: Patient updated bedside  - Inter-disciplinary family meet or Palliative Care meeting  due by:  day 7  The patient is critically ill with multiple organ systems failure and requires high complexity decision making for assessment and support, frequent evaluation and titration of therapies, application of advanced monitoring technologies and extensive interpretation of multiple databases.   Critical Care Time devoted to patient care services described in this note is  35  Minutes. This time reflects time of care of this signee Dr Jennet Maduro. This critical care time does not reflect procedure time, or teaching time or supervisory time of PA/NP/Med student/Med Resident etc but could involve care discussion time.  Rush Farmer, M.D. [facility] Pulmonary/Critical Care Medicine. Pager: [phone]. After hours pager: [phone].  06/08/2017, 11:47 AM

## 2017-06-09 ENCOUNTER — Inpatient Hospital Stay (HOSPITAL_COMMUNITY): Payer: Medicare Other

## 2017-06-09 ENCOUNTER — Encounter (HOSPITAL_COMMUNITY): Payer: Self-pay | Admitting: Gastroenterology

## 2017-06-09 LAB — CBC
HEMATOCRIT: 22.5 % — AB (ref 39.0–52.0)
Hemoglobin: 7.3 g/dL — ABNORMAL LOW (ref 13.0–17.0)
MCH: 29 pg (ref 26.0–34.0)
MCHC: 32.4 g/dL (ref 30.0–36.0)
MCV: 89.3 fL (ref 78.0–100.0)
Platelets: 330 10*3/uL (ref 150–400)
RBC: 2.52 MIL/uL — ABNORMAL LOW (ref 4.22–5.81)
RDW: 19.1 % — AB (ref 11.5–15.5)
WBC: 14.9 10*3/uL — ABNORMAL HIGH (ref 4.0–10.5)

## 2017-06-09 LAB — BASIC METABOLIC PANEL
Anion gap: 9 (ref 5–15)
BUN: 19 mg/dL (ref 6–20)
CALCIUM: 8.6 mg/dL — AB (ref 8.9–10.3)
CO2: 24 mmol/L (ref 22–32)
Chloride: 112 mmol/L — ABNORMAL HIGH (ref 101–111)
Creatinine, Ser: 1.19 mg/dL (ref 0.61–1.24)
GFR calc Af Amer: >60 mL/min (ref >60)
GFR calc non Af Amer: 58 mL/min — ABNORMAL LOW (ref >60)
GLUCOSE: 96 mg/dL (ref 65–99)
POTASSIUM: 3.7 mmol/L (ref 3.5–5.1)
Sodium: 145 mmol/L (ref 135–145)

## 2017-06-09 LAB — PATHOLOGIST SMEAR REVIEW

## 2017-06-09 LAB — URINE CULTURE

## 2017-06-09 LAB — MAGNESIUM: Magnesium: 2 mg/dL (ref 1.7–2.4)

## 2017-06-09 LAB — PHOSPHORUS: Phosphorus: 3.7 mg/dL (ref 2.5–4.6)

## 2017-06-09 LAB — HEMOGLOBIN AND HEMATOCRIT, BLOOD
HCT: 22.8 % — ABNORMAL LOW (ref 39.0–52.0)
HEMOGLOBIN: 7.2 g/dL — AB (ref 13.0–17.0)

## 2017-06-09 LAB — PROCALCITONIN: PROCALCITONIN: 0.29 ng/mL

## 2017-06-09 MED ORDER — LIDOCAINE HCL 1 % IJ SOLN
INTRAMUSCULAR | Status: AC
  Filled 2017-06-09: qty 20

## 2017-06-09 MED ORDER — VANCOMYCIN HCL IN DEXTROSE 1-5 GM/200ML-% IV SOLN
1000.0000 mg | Freq: Two times a day (BID) | INTRAVENOUS | Status: DC
  Administered 2017-06-09: 1000 mg via INTRAVENOUS
  Filled 2017-06-09: qty 200

## 2017-06-09 NOTE — Progress Notes (Signed)
UNASSIGNED PATIENT Subjective: Patient seems to be doing well today. There has been no evidence of any further bleeding since last night. He tolerated his lunch well.  He denies having any active GI issues at this time.   Objective: Vital signs in last 24 hours: Temp:  [97.6 F (36.4 C)-98.6 F (37 C)] 98.6 F (37 C) (01/23 1533) Pulse Rate:  [10-111] 77 (01/23 1545) Resp:  [0-28] 15 (01/23 1545) BP: (119-166)/(53-123) 141/72 (01/23 1500) SpO2:  [93 %-100 %] 100 % (01/23 1545) Weight:  [97.8 kg (215 lb 9.8 oz)] 97.8 kg (215 lb 9.8 oz) (01/23 0500) Last BM Date: 06/09/17  Intake/Output from previous day: 01/22 0701 - 01/23 0700 In: 1205 [I.V.:225; Blood:680; IV Piggyback:300] Out: 2426 [Urine:1355] Intake/Output this shift: Total I/O In: 537.5 [P.O.:500; IV Piggyback:37.5] Out: 600 [Urine:600]  General appearance: alert, cooperative, no distress, pale and slowed mentation Resp: clear to auscultation bilaterally Cardio: regular rate and rhythm, S1, S2 normal, no murmur, click, rub or gallop GI: soft, non-tender; bowel sounds normal; no masses,  no organomegaly Extremities: extremities normal, atraumatic, no cyanosis or edema  Lab Results: Recent Labs    06/07/17 0916 06/08/17 0110  06/08/17 1805 06/08/17 2241 06/09/17 0724  WBC 17.3* 18.8*  --   --   --  14.9*  HGB 8.0* 7.8*   < > 8.5* 8.2* 7.3*  HCT 26.1* 24.8*   < > 26.2* 23.5* 22.5*  PLT 497* 471*  --   --   --  330   < > = values in this interval not displayed.   BMET Recent Labs    06/07/17 0916 06/08/17 0110 06/09/17 0724  NA 143 143 145  K 2.6* 3.2* 3.7  CL 109 110 112*  CO2 25 22 24   GLUCOSE 164* 117* 96  BUN 22* 22* 19  CREATININE 1.28* 1.11 1.19  CALCIUM 8.6* 8.5* 8.6*   LFT Recent Labs    06/07/17 0916  PROT 4.7*  ALBUMIN 2.3*  AST 21  ALT 16*  ALKPHOS 138*  BILITOT 0.6   PT/INR Recent Labs    06/07/17 0916 06/08/17 0110  LABPROT 15.3* 15.4*  INR 1.22 1.23   Hepatitis Panel No  results for input(s): HEPBSAG, HCVAB, HEPAIGM, HEPBIGM in the last 72 hours. C-Diff No results for input(s): CDIFFTOX in the last 72 hours. No results for input(s): CDIFFPCR in the last 72 hours. Fecal Lactopherrin No results for input(s): FECLLACTOFRN in the last 72 hours.  Studies/Results: Nm Gi Blood Loss  Result Date: 06/08/2017 CLINICAL DATA:  GI bleeding EXAM: NUCLEAR MEDICINE GASTROINTESTINAL BLEEDING SCAN TECHNIQUE: Sequential abdominal images were obtained following intravenous administration of Tc-93m labeled red blood cells. RADIOPHARMACEUTICALS:  26.1 mCi Tc-70m pertechnetate in-vitro labeled autologous red cells. COMPARISON:  CT abdomen and pelvis 06/07/2017 FINDINGS: Initial normal blood pool distribution of labeled red cells. Beginning at 4 minutes, abnormal tracer localization is identified at approximately the junction of the descending and sigmoid colon. Mild retrograde and antegrade peristalsis of tracer is seen. Findings are compatible with an active source of GI bleeding in the LEFT colon at approximately the descending-sigmoid junction. IMPRESSION: Positive GI bleeding exam with a site of active colonic bleeding identified in the LEFT lower quadrant at approximately the descending-sigmoid junction. Electronically Signed   By: Lavonia Dana M.D.   On: 06/08/2017 15:50   Ct Angio Abd/pel W And/or Wo Contrast  Result Date: 06/07/2017 CLINICAL DATA:  morning around 3 AM he had a jellylike bloody bowel movement. He is  again twice had bloody red bowel movements in the ED since arrival. Patient has never had a colonoscopy before in the past. He denies any rectal bleeding in the past. He denies any rectal pain. Patient is referred for admission for GI bleed. Patient also has h/o aneurysm ascending aorta. EXAM: CTA ABDOMEN AND PELVIS WITH CONTRAST TECHNIQUE: Multidetector CT imaging of the abdomen and pelvis was performed using the standard protocol during bolus administration of intravenous  contrast. Multiplanar reconstructed images and MIPs were obtained and reviewed to evaluate the vascular anatomy. CONTRAST:  145mL ISOVUE-370 IOPAMIDOL (ISOVUE-370) INJECTION 76% COMPARISON:  04/30/2017 FINDINGS: VASCULAR Aorta: Scattered calcified plaque. No aneurysm, dissection, or stenosis. Celiac: Patent without evidence of aneurysm, dissection, vasculitis or significant stenosis. SMA: Patent without evidence of aneurysm, dissection, vasculitis or significant stenosis. Renals: Single left, widely patent. Duplicated right, inferior dominant, both patent. IMA: Patent without evidence of aneurysm, dissection, vasculitis or significant stenosis. Inflow: Mild scattered calcified plaque. No aneurysm, dissection, or stenosis. Proximal Outflow: Bilateral common femoral and visualized portions of the superficial and profunda femoral arteries are patent without evidence of aneurysm, dissection, vasculitis or significant stenosis. Veins: Patent hepatic veins, portal vein, SMV, splenic vein, bilateral renal veins, IVC and iliac venous system. Review of the MIP images confirms the above findings. NON-VASCULAR Lower chest: No acute abnormality. Hepatobiliary: No focal liver abnormality is seen. No gallstones, gallbladder wall thickening, or biliary dilatation. Pancreas: Unremarkable. No pancreatic ductal dilatation or surrounding inflammatory changes. 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 No new lesion. Adrenals/Urinary Tract: Normal adrenals. 3 cm lower pole right renal cyst as before. Bilateral hydronephrosis and ureterectasis right greater than left, largely stable. Thick-walled urinary bladder with bilateral diverticula right greater than left. Stomach/Bowel: No evidence of active extravasation. Physiologic gastric distention. The small bowel is nondilated. Normal appendix. The colon is nondistended. Innumerable descending and sigmoid diverticula without  significant adjacent inflammatory/edematous change evident. Rectal drain is in place. Lymphatic: No definite abdominal or pelvic adenopathy. Reproductive: Mild prostatic enlargement. Other: No ascites.  No free air. Musculoskeletal: There is extensive body wall edema. Diffuse sclerosis of visualized lower thoracic and lumbar spine, ribs, pelvis, and proximal femurs with multiple small lytic lucencies as before. Negative for fracture. IMPRESSION: VASCULAR 1. No evidence of active extravasation into the GI tract. 2. Aortoiliac atherosclerosis without aneurysm or stenosis. NON-VASCULAR 1. Descending and sigmoid diverticulosis. 2. Stable bilateral hydronephrosis and ureterectasis, with distended urinary bladder containing multiple diverticula, suggesting a degree of bladder outlet obstruction. 3. Diffuse osseous changes consistent with known myeloproliferative disorder. Electronically Signed   By: Lucrezia Europe M.D.   On: 06/07/2017 19:25   Medications: I have reviewed the patient's current medications.  Assessment/Plan: Anemia secondary to diverticular bleed-currently stable. Continue to monitor closely.  LOS: 2 days   Juan [name] 06/09/2017, 4:14 PM

## 2017-06-09 NOTE — Progress Notes (Signed)
PULMONARY / CRITICAL CARE MEDICINE   Name: Juan [name] MRN: [id number] DOB: [birth year]    ADMISSION DATE:  06/07/2017 CONSULTATION DATE:  06/08/2017  REFERRING MD:  Greig Castilla  CHIEF COMPLAINT:  GI Bleeding  HISTORY OF PRESENT ILLNESS:   55 yoM with extensive PMHx who presents to the hospital with the chief complaint of BRBPR, patient was transfused 5 units pRBC so far, Hgb is stable but continues to bleed.  He has no other complaints other than being hungry and thirsty.  Denies SOB, N/V, dizziness, palpitation, chest pain, melena or hematemesis.  Patient was recently admitted for sepsis and was sent home with a tunneled IJ for IV abx but arrived to the hospital with the line not even dressed.  GI asked for patient to be transferred to the ICU for endoscopy studies and IR is aware of patient.  PCCM asked to assume care.  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  PAST SURGICAL HISTORY: He  has a past surgical history that includes Toe Surgery (Right); IR US Guide Vasc Access Right (05/07/2017); IR Fluoro Guide CV Line Right (05/07/2017); and IR Fluoro Rm 30-60 Min (05/07/2017).  No Known Allergies  No current facility-administered medications on file prior to encounter.    Current Outpatient Medications on File Prior to Encounter  Medication Sig  . acetaminophen (TYLENOL) 325 MG tablet Take 2 tablets (650 mg total) by mouth every 6 (six) hours as needed for  mild pain (or Fever >/= 101).  Marland Kitchen aspirin 325 MG tablet Take 325 mg by mouth daily.  Marland Kitchen b complex vitamins tablet Take 1 tablet by mouth daily.  . colchicine 0.6 MG tablet Take 1 tablet (0.6 mg total) by mouth daily.  . diclofenac sodium (VOLTAREN) 1 % GEL Apply 4 g topically 4 (four) times daily. Apply to bilateral  knees  . ferrous gluconate (FERGON) 240 (27 FE) MG tablet Take 240 mg by mouth daily.  . fluticasone (FLONASE) 50 MCG/ACT nasal spray Place 2 sprays into both nostrils daily. Reported on 05/02/2015  . furosemide (LASIX) 40 MG tablet Take 40 mg by mouth daily.  Marland Kitchen gabapentin (NEURONTIN) 100 MG capsule Take 2 capsules (200 mg total) by mouth 3 (three) times daily as needed (pain). Reported on 08/22/2015  . hydrALAZINE (APRESOLINE) 25 MG tablet Take 1 tablet (25 mg total) by mouth 3 (three) times daily.  . hydrocerin (EUCERIN) CREA Apply Eucerin cream to BLE Q day after bathing and roughly towel drying to remove loose skin  . hydrOXYzine (ATARAX/VISTARIL) 10 MG tablet Take 1 tablet (10 mg total) by mouth every 4 (four) hours as needed for itching.  . OXYGEN Inhale 2 L into the lungs continuous.  . tamsulosin (FLOMAX) 0.4 MG CAPS capsule Take 0.4 mg by mouth.    FAMILY HISTORY:  His indicated that his mother is deceased. He indicated that his father is deceased. He indicated that his brother is alive. He indicated that his maternal grandmother is deceased. He indicated that his maternal grandfather is deceased. He indicated that his paternal grandmother is deceased. He indicated that his paternal  grandfather is deceased. He indicated that his daughter is alive. He indicated that his son is alive. He indicated that the status of his neg hx is unknown.   SOCIAL HISTORY: He  reports that he quit smoking about 51 years ago. His smoking use included cigarettes. He has a 3.00 pack-year smoking history. he has never used smokeless tobacco. He reports that he does not drink alcohol or use  drugs.  REVIEW OF SYSTEMS:   Mild confusion, unable to attain a full ROS  SUBJECTIVE:  Hungry and thirsty  VITAL SIGNS: BP (!) 147/66   Pulse 80   Temp 97.9 F (36.6 C) (Oral)   Resp 12   Ht 6\' 3"  (1.905 m)   Wt 208 lb 5.4 oz (94.5 kg)   SpO2 100%   BMI 26.04 kg/m   HEMODYNAMICS:    VENTILATOR SETTINGS:    INTAKE / OUTPUT: I/O last 3 completed shifts: In: 2948.9 [I.V.:198; Blood:2150.9; IV Piggyback:600] Out: 3295 [Urine:3430; JOACZ:660]  PHYSICAL EXAMINATION: General:  NAD,  Neuro:  Alert and oriented to self and place but not time.  Moving all ext to commands. HEENT:  [place]/AT, PERRL, EOM-I and MMM Cardiovascular:  RRR, Nl S1/S2 and -M/R/G. Lungs:  CTA bilaterally Abdomen:  Soft, NT, ND and +BS Musculoskeletal:  +1 pedal edema and -tenderness Skin:  Point tenderness and erythema at the right tunnled PICC line otherwise intact.  LABS:  BMET Recent Labs  Lab 06/07/17 0916 06/08/17 0110  NA 143 143  K 2.6* 3.2*  CL 109 110  CO2 25 22  BUN 22* 22*  CREATININE 1.28* 1.11  GLUCOSE 164* 117*   Electrolytes Recent Labs  Lab 06/07/17 0916 06/08/17 0110  CALCIUM 8.6* 8.5*  MG  --  1.6*   CBC Recent Labs  Lab 06/07/17 0916 06/08/17 0110 06/08/17 1059 06/08/17 1805 06/08/17 2241  WBC 17.3* 18.8*  --   --   --   HGB 8.0* 7.8* 8.3* 8.5* 8.2*  HCT 26.1* 24.8* 25.5* 26.2* 23.5*  PLT 497* 471*  --   --   --    Coag's Recent Labs  Lab 06/07/17 0916 06/08/17 0110  INR 1.22 1.23   Sepsis Markers Recent Labs  Lab 06/08/17 1216  PROCALCITON 0.27   ABG No results for input(s): PHART, PCO2ART, PO2ART in the last 168 hours.  Liver Enzymes Recent Labs  Lab 06/07/17 0916  AST 21  ALT 16*  ALKPHOS 138*  BILITOT 0.6  ALBUMIN 2.3*   Cardiac Enzymes No results for input(s): TROPONINI, PROBNP in the last 168 hours.  Glucose No results for input(s): GLUCAP in the last 168 hours.  Imaging Nm Gi Blood Loss  Result Date: 06/08/2017 CLINICAL  DATA:  GI bleeding EXAM: NUCLEAR MEDICINE GASTROINTESTINAL BLEEDING SCAN TECHNIQUE: Sequential abdominal images were obtained following intravenous administration of Tc-76m labeled red blood cells. RADIOPHARMACEUTICALS:  26.1 mCi Tc-75m pertechnetate in-vitro labeled autologous red cells. COMPARISON:  CT abdomen and pelvis 06/07/2017 FINDINGS: Initial normal blood pool distribution of labeled red cells. Beginning at 4 minutes, abnormal tracer localization is identified at approximately the junction of the descending and sigmoid colon. Mild retrograde and antegrade peristalsis of tracer is seen. Findings are compatible with an active source of GI bleeding in the LEFT colon at approximately the descending-sigmoid junction. IMPRESSION: Positive GI bleeding exam with a site of active colonic bleeding identified in the LEFT lower quadrant at approximately the descending-sigmoid junction. Electronically Signed   By: Lavonia Dana M.D.   On: 06/08/2017  15:50   STUDIES:  1/22 GI endoscopy  CULTURES: Blood 1/22>>>1/23 Urine 1/22>>>1/23  ANTIBIOTICS: Rocephin 1/22>>>1/22 Zosyn 1/22>>> Vancomycin 1/22>>>  SIGNIFICANT EVENTS: 1/22>>>transfer to the ICU at GI request for endoscopy  LINES/TUBES: R IJ Tunneled catheter unknown placement date (last admission)>>>1/22  I reviewed CT myself, no clues to bleeding noted on CT  DISCUSSION: 54 yoM w/ PMHx above presenting to ICU for endoscopy and continuous GI bleeding with concern for line sepsis. GI placed clips with subsequent discontinuation of the bleeding, however, they are not confident that they ligated the bleeding vessel.   ASSESSMENT / PLAN:  PULMONARY A: Hypoxemia, likley secondary to hemorrhage as it improved with PRBC transfusion  P:   - Titrate O2 for sat of 88-92% - Monitor closely for airway protection when patient is undergoing sedation. - Echo on 10/14/2016 indicated EF 55-60% with mild right atrium dilation and a PA peak pressure of 59mmHg  concerning for severe pulmonary hypertension   CARDIOVASCULAR A:  Sinus tachycardia-improved with Hgb increase Borderline BP due to hemorrhage HTN- hydralazine, 25mg  TID at home Holding hydralazine, and lasix P:  - Transfused 2 units of RBC's - Tele monitoring continued  - Severe pulmonary hypertension  - Discontinued fluids, G1DD on home furosemide with mild pedal edema, Hypertensive   RENAL A:   Hypokalemia Serum Cr 1.19 on 1/23, improved P:   - Replace electrolytes as indicated - BMET in AM - Discontinued fluids given BP, improved renal function and history of heart failure  GASTROINTESTINAL A:   Bleeding scan completed on 1/23,  "Positive GI bleeding exam with a site of active colonic bleeding identified in the LEFT lower quadrant at approximately the descending-sigmoid junction." GI placed clips but are unsure if the correct vessel was ligated. Will need to follow P:   - GI continues to follow - Transfuse per ICU protocol - Will await GI recs for diet orders  HEMATOLOGIC A:   Myelodysplastic syndrome with last dose of [name] 03/2017 Hgb 7.3 down from 8.2 the day prior Given ?4 units of PRBCs since presentation  P:  - CBC daily for Hgb - Transfuse per ICU protocol with Hgb <7  INFECTIOUS A:   ?Line sepsis? SIRS criteria negative on admission. WBC 18.8, HR ~80's, RR ~14, and afebrile P:   - Continue PICC line during this admission - Broad spectrum abx - PCT pending   ENDOCRINE A:   No active issues   P:   - Monitor  NEUROLOGIC A:   AMS P:   - Minimize sedation - Observe while undergoing conscious sedation  FAMILY  - Updates: Patient updated bedside  - Inter-disciplinary family meet or Palliative Care meeting due by:  day [place], MD [place]. Pager# [phone]

## 2017-06-10 ENCOUNTER — Encounter: Payer: Self-pay | Admitting: Internal Medicine

## 2017-06-10 DIAGNOSIS — D62 Acute posthemorrhagic anemia: Secondary | ICD-10-CM

## 2017-06-10 DIAGNOSIS — I10 Essential (primary) hypertension: Secondary | ICD-10-CM

## 2017-06-10 LAB — CBC
HEMATOCRIT: 20.7 % — AB (ref 39.0–52.0)
HEMATOCRIT: 23.3 % — AB (ref 39.0–52.0)
HEMOGLOBIN: 6.6 g/dL — AB (ref 13.0–17.0)
Hemoglobin: 7.2 g/dL — ABNORMAL LOW (ref 13.0–17.0)
MCH: 28.8 pg (ref 26.0–34.0)
MCH: 27.8 pg (ref 26.0–34.0)
MCHC: 31.9 g/dL (ref 30.0–36.0)
MCHC: 30.9 g/dL (ref 30.0–36.0)
MCV: 90.4 fL (ref 78.0–100.0)
MCV: 90 fL (ref 78.0–100.0)
PLATELETS: 333 10*3/uL (ref 150–400)
Platelets: 353 10*3/uL (ref 150–400)
RBC: 2.29 MIL/uL — AB (ref 4.22–5.81)
RBC: 2.59 MIL/uL — ABNORMAL LOW (ref 4.22–5.81)
RDW: 20.1 % — ABNORMAL HIGH (ref 11.5–15.5)
RDW: 19.3 % — AB (ref 11.5–15.5)
WBC: 16.1 10*3/uL — ABNORMAL HIGH (ref 4.0–10.5)
WBC: 16.1 10*3/uL — AB (ref 4.0–10.5)

## 2017-06-10 LAB — BASIC METABOLIC PANEL
ANION GAP: 7 (ref 5–15)
BUN: 20 mg/dL (ref 6–20)
CHLORIDE: 111 mmol/L (ref 101–111)
CO2: 26 mmol/L (ref 22–32)
CREATININE: 1.1 mg/dL (ref 0.61–1.24)
Calcium: 8.7 mg/dL — ABNORMAL LOW (ref 8.9–10.3)
GFR calc non Af Amer: >60 mL/min (ref >60)
Glucose, Bld: 99 mg/dL (ref 65–99)
POTASSIUM: 3.6 mmol/L (ref 3.5–5.1)
SODIUM: 144 mmol/L (ref 135–145)

## 2017-06-10 LAB — HEMOGLOBIN AND HEMATOCRIT, BLOOD
HEMATOCRIT: 23.5 % — AB (ref 39.0–52.0)
HEMATOCRIT: 23 % — AB (ref 39.0–52.0)
HEMOGLOBIN: 7.5 g/dL — AB (ref 13.0–17.0)
HEMOGLOBIN: 7.2 g/dL — AB (ref 13.0–17.0)

## 2017-06-10 LAB — PREPARE RBC (CROSSMATCH)

## 2017-06-10 LAB — GLUCOSE, CAPILLARY: GLUCOSE-CAPILLARY: 110 mg/dL — AB (ref 65–99)

## 2017-06-10 LAB — PROCALCITONIN: PROCALCITONIN: 0.25 ng/mL

## 2017-06-10 MED ORDER — POTASSIUM CHLORIDE CRYS ER 20 MEQ PO TBCR
20.0000 meq | EXTENDED_RELEASE_TABLET | ORAL | Status: AC
  Administered 2017-06-10: 20 meq via ORAL
  Filled 2017-06-10: qty 1

## 2017-06-10 NOTE — Progress Notes (Signed)
Pt. Arrived Alert and Oriented X 4. Denies pain. All questions and concerns addressed Bed in the lowest position and alarm set. Call light in reach. Family (son) at beside

## 2017-06-10 NOTE — Progress Notes (Signed)
H/H noted to be c/w previous result.  No additional bleeding noted since prior to beginning of evening shift.  Dr. Benson Norway, GI called to f/u with me regarding earlier event.  No additional bleeding noted.  If hemoglobin/hematocrit drops or pt has additional bleeding episodes will f/u with PCCM per conversation with GI.

## 2017-06-10 NOTE — Progress Notes (Signed)
Nursed noted blood with large clots coming from his rectum.  MD notify

## 2017-06-10 NOTE — Progress Notes (Signed)
I was called by nursing earlier about a recurrence of hematochezia.  Per the night nurse an H/H is pending and there is no reported bleeding at this time.  If he continues to bleed IR needs to be consulted for embolization.  The proximal hemoclip placed two days ago is closest to the bleeding diverticulum.

## 2017-06-10 NOTE — Progress Notes (Signed)
PULMONARY / CRITICAL CARE MEDICINE   Name: Ky Rumple MRN: [id number] DOB: [birth year]    ADMISSION DATE:  06/07/2017 CONSULTATION DATE:  06/08/2017  REFERRING MD:  Greig Castilla  CHIEF COMPLAINT:  GI Bleeding  HISTORY OF PRESENT ILLNESS:   77 yoM with extensive PMHx who presents to the hospital with the chief complaint of BRBPR, patient was transfused 5 units pRBC so far, Hgb is stable but continues to bleed.  He has no other complaints other than being hungry and thirsty.  Denies SOB, N/V, dizziness, palpitation, chest pain, melena or hematemesis.  Patient was recently admitted for sepsis and was sent home with a tunneled IJ for IV abx but arrived to the hospital with the line not even dressed.  GI asked for patient to be transferred to the ICU for endoscopy studies and IR is aware of patient.  PCCM asked to assume care.  REVIEW OF SYSTEMS:   General: feeling well today. No complaints. Neuro: denied numbness, tingling, weakness HEENT: Denied headache, eye pain, visual changes, rhinorrhea  Cardio: denied chest pain, palpitations  Pulmonary: Denied dyspnea, wheezing, or pain with breathing GI: Denied abdominal pain GU: denied dysuria, hematuria, pyuria  Musk: denied pain, attested to bilateral pedal edema but thinks it is improving Skin: denied pain or rash  SUBJECTIVE:  Hungry and thirsty  VITAL SIGNS: BP 104/76   Pulse 78   Temp 98.3 F (36.8 C) (Oral)   Resp (!) 25   Ht 6\' 3"  (1.905 m)   Wt 215 lb 9.8 oz (97.8 kg)   SpO2 100%   BMI 26.95 kg/m   HEMODYNAMICS:    VENTILATOR SETTINGS:    INTAKE / OUTPUT: I/O last 3 completed shifts: In: 1862.5 [P.O.:620; I.V.:225; Blood:680; IV Piggyback:337.5] Out: 1955 [KVQQV:9563]  PHYSICAL EXAMINATION: General:  NAD, sitting comfortably in his bed. Not diaphoretic  Neuro:  Alert and oriented x 3. Moving all ext to commands. HEENT:  [place]/AT, PERRL, EOM-I and MMM Cardiovascular:  RRR, Nl S1/S2 and -M/R/G. Lungs:  CTA bilaterally,  no wheezing, rhonchi or crackles auscultated  Abdomen:  Soft, NT, ND and +BS Musculoskeletal:  +1 pedal edema and -tenderness Skin:  No erythema, point tenderness or rash noted at PICC line site  LABS:  BMET Recent Labs  Lab 06/07/17 0916 06/08/17 0110 06/09/17 0724  NA 143 143 145  K 2.6* 3.2* 3.7  CL 109 110 112*  CO2 25 22 24   BUN 22* 22* 19  CREATININE 1.28* 1.11 1.19  GLUCOSE 164* 117* 96   Electrolytes Recent Labs  Lab 06/07/17 0916 06/08/17 0110 06/09/17 0724  CALCIUM 8.6* 8.5* 8.6*  MG  --  1.6* 2.0  PHOS  --   --  3.7   CBC Recent Labs  Lab 06/07/17 0916 06/08/17 0110  06/08/17 2241 06/09/17 0724 06/09/17 1800  WBC 17.3* 18.8*  --   --  14.9*  --   HGB 8.0* 7.8*   < > 8.2* 7.3* 7.2*  HCT 26.1* 24.8*   < > 23.5* 22.5* 22.8*  PLT 497* 471*  --   --  330  --    < > = values in this interval not displayed.   Coag's Recent Labs  Lab 06/07/17 0916 06/08/17 0110  INR 1.22 1.23   Sepsis Markers Recent Labs  Lab 06/08/17 1216 06/09/17 0724  PROCALCITON 0.27 0.29   ABG No results for input(s): PHART, PCO2ART, PO2ART in the last 168 hours.  Liver Enzymes Recent Labs  Lab 06/07/17 [phone]  AST 21  ALT 16*  ALKPHOS 138*  BILITOT 0.6  ALBUMIN 2.3*   Cardiac Enzymes No results for input(s): TROPONINI, PROBNP in the last 168 hours.  Glucose Recent Labs  Lab 06/09/17 2330  GLUCAP 110*    Imaging No results found. STUDIES:  1/22 GI endoscopy  CULTURES: Blood 1/22>>>1/23 Urine 1/22>>>1/23  ANTIBIOTICS: Rocephin 1/22>>>1/22 Zosyn 1/22>>> Vancomycin 1/22>>>  SIGNIFICANT EVENTS: 1/22>>>transfer to the ICU at GI request for endoscopy  LINES/TUBES: R IJ Tunneled catheter unknown placement date (last admission)>>>1/22  DISCUSSION: 77 yoM w/ PMHx above presenting to ICU for endoscopy and continuous GI bleeding with concern for line sepsis. GI placed clips with subsequent discontinuation of the bleeding, however, they are not confident  that they ligated the bleeding vessel.   ASSESSMENT / PLAN:  PULMONARY A: Hypoxemia, likley secondary to hemorrhage as it improved with PRBC transfusion  P:   - Titrate O2 for sat of 88-92% - Monitor closely for airway protection when patient is undergoing sedation. - Echo on 10/14/2016 indicated EF 55-60% with mild right atrium dilation and a PA peak pressure of 61mmHg concerning for severe pulmonary hypertension   CARDIOVASCULAR A:  Sinus tachycardia-improved with Hgb increase Borderline BP due to hemorrhage HTN- hydralazine, 25mg  TID at home Holding hydralazine, and lasix P:  - Transfused 2 units of RBC's - Tele monitoring continued  - Severe pulmonary hypertension  - Discontinued fluids, G1DD on home furosemide with mild pedal edema, Hypertensive   RENAL A:   Hypokalemia Serum Cr 1.19 on 1/23, improved P:   - Replace electrolytes as indicated - BMET in AM - Discontinued fluids given BP, improved renal function and history of heart failure  GASTROINTESTINAL A:   Bleeding scan completed on 1/23,  "Positive GI bleeding exam with a site of active colonic bleeding identified in the LEFT lower quadrant at approximately the descending-sigmoid junction." GI placed clips but are unsure if the correct vessel was ligated. Will need to follow P:   - GI continues to follow - Transfuse per ICU protocol - Will await GI recs for diet orders  HEMATOLOGIC A:   Myelodysplastic syndrome with last dose of [name] 03/2017 Hgb 6.6 down from 7.3 the day prior Given ?4 units of PRBCs since presentation  P:  - CBC daily for Hgb - Transfuse per ICU protocol with Hgb <7 - Ordered one unit PRBC's  INFECTIOUS A:   ?Line sepsis?-no indication of systemic infection or source, line site appears clean despite lack of appropriate cover prior to admission.  SIRS criteria negative on admission. WBC 18.8, HR ~80's, RR ~14, and afebrile P:   - Continue PICC line during this admission - PCT  0.29-antibiotics discontinued   ENDOCRINE A:   No active issues   P:   - Monitor  NEUROLOGIC A:   AMS P:   - Minimize sedation - Observe while undergoing conscious sedation  FAMILY  - Updates: Patient updated bedside  - Inter-disciplinary family meet or Palliative Care meeting due by:  day [place], MD [place]. Pager# [phone]  Attending Note:  77 year old male with PMH above presenting with GI bleeding that GI is following.  No hypotension overnight but Hg down to 6.6. On exam, lungs are clear.  I reviewed bleeding scan myself, bleeding in the left colon noted.  Discussed with PCCM-NP.    GI bleeding:  - Clip per GI  - Monitor  Hemorrhagic anemia:  - CBC in AM  -  Transfuse 1 unit  Hypoxemia:  - Titrate O2 for sat of 88-92%  - Hope to get off O2 ASAP  HTN:  - Home anti-HTN on hold  Transfer to tele and to [facility] with PCCM off 1/25.  Patient seen and examined, agree with above note.  I dictated the care and orders written for this patient under my direction.  Rush Farmer, [place]

## 2017-06-10 NOTE — Progress Notes (Signed)
Subjective: No reports of hematochezia.  Objective: Vital signs in last 24 hours: Temp:  [97.2 F (36.2 C)-98.9 F (37.2 C)] 97.7 F (36.5 C) (01/24 1352) Pulse Rate:  [61-105] 81 (01/24 1352) Resp:  [0-26] 10 (01/24 1352) BP: (104-164)/(54-96) 159/66 (01/24 1352) SpO2:  [94 %-100 %] 100 % (01/24 1352) Weight:  [94 kg (207 lb 3.7 oz)] 94 kg (207 lb 3.7 oz) (01/24 0500) Last BM Date: 06/09/17  Intake/Output from previous day: 01/23 0701 - 01/24 0700 In: 660.5 [P.O.:620; I.V.:3; IV Piggyback:37.5] Out: 1300 [Urine:1300] Intake/Output this shift: Total I/O In: 585 [P.O.:250; I.V.:20; Blood:315] Out: 400 [Urine:400]  General appearance: alert and weak and ill appearing GI: soft, non-tender; bowel sounds normal; no masses,  no organomegaly  Lab Results: Recent Labs    06/09/17 0724 06/09/17 1800 06/10/17 0439 06/10/17 1321  WBC 14.9*  --  16.1* 16.1*  HGB 7.3* 7.2* 6.6* 7.2*  7.5*  HCT 22.5* 22.8* 20.7* 23.3*  23.5*  PLT 330  --  353 333   BMET Recent Labs    06/08/17 0110 06/09/17 0724 06/10/17 0439  NA 143 145 144  K 3.2* 3.7 3.6  CL 110 112* 111  CO2 22 24 26   GLUCOSE 117* 96 99  BUN 22* 19 20  CREATININE 1.11 1.19 1.10  CALCIUM 8.5* 8.6* 8.7*   LFT No results for input(s): PROT, ALBUMIN, AST, ALT, ALKPHOS, BILITOT, BILIDIR, IBILI in the last 72 hours. PT/INR Recent Labs    06/08/17 0110  LABPROT 15.4*  INR 1.23   Hepatitis Panel No results for input(s): HEPBSAG, HCVAB, HEPAIGM, HEPBIGM in the last 72 hours. C-Diff No results for input(s): CDIFFTOX in the last 72 hours. Fecal Lactopherrin No results for input(s): FECLLACTOFRN in the last 72 hours.  Studies/Results: No results found.  Medications:  Scheduled: . potassium chloride  20 mEq Oral Q4H  . sodium chloride flush  3 mL Intravenous Q12H  . tamsulosin  0.4 mg Oral Daily   Continuous: . sodium chloride      Assessment/Plan: 1) Descending-sigmoid colon junction diverticular  bleed. 2) Anemia.   No further hematochezia per patient report.  HGB did drop down, but he has a myeloproliferative disorder.  Plan: 1) Follow HGB and transfuse as necessary. 2) No further GI intervention.  Signing off.  Call if his clinical status changes.  LOS: 3 days   Quantina Dershem D 06/10/2017, 3:50 PM

## 2017-06-10 NOTE — Progress Notes (Signed)
Report called to [place], tx w'/ vss , recvd by RN and report confirmed at bs.

## 2017-06-10 NOTE — Progress Notes (Signed)
[facility] ADULT ICU REPLACEMENT PROTOCOL FOR AM LAB REPLACEMENT ONLY  The patient does apply for the [facility] Adult ICU Electrolyte Replacment Protocol based on the criteria listed below:   1. Is GFR >/= 40 ml/min? Yes.    Patient's GFR today is >60 2. Is urine output >/= 0.5 ml/kg/hr for the last 6 hours? Yes.   Patient's UOP is .7 ml/kg/hr 3. Is BUN < 60 mg/dL? Yes.    Patient's BUN today is 20 4. Abnormal electrolyte(s): K-3.6 5. Ordered repletion with: per protocol 6. If a panic level lab has been reported, has the CCM MD in charge been notified? Yes.  .   Physician:  Dr. Ethlyn Gallery, Philis [place] 06/10/2017 6:36 AM

## 2017-06-11 DIAGNOSIS — D471 Chronic myeloproliferative disease: Secondary | ICD-10-CM

## 2017-06-11 LAB — BPAM RBC
BLOOD PRODUCT EXPIRATION DATE: 201901262359
BLOOD PRODUCT EXPIRATION DATE: 201902122359
BLOOD PRODUCT EXPIRATION DATE: 201902122359
Blood Product Expiration Date: 201901302359
Blood Product Expiration Date: 201902132359
Blood Product Expiration Date: 201902132359
Blood Product Expiration Date: 201902142359
Blood Product Expiration Date: 201902142359
Blood Product Expiration Date: 201902142359
ISSUE DATE / TIME: 201901211830
ISSUE DATE / TIME: 201901220338
ISSUE DATE / TIME: 201901240849
ISSUE DATE / TIME: 201901211455
ISSUE DATE / TIME: 201901220831
ISSUE DATE / TIME: 201901221129
UNIT TYPE AND RH: 5100
UNIT TYPE AND RH: 5100
UNIT TYPE AND RH: 5100
UNIT TYPE AND RH: 5100
Unit Type and Rh: 5100
Unit Type and Rh: 5100
Unit Type and Rh: 5100
Unit Type and Rh: 5100
Unit Type and Rh: 5100

## 2017-06-11 LAB — TYPE AND SCREEN
ABO/RH(D): O POS
ABO/RH(D): O POS
ANTIBODY SCREEN: NEGATIVE
Antibody Screen: NEGATIVE
UNIT DIVISION: 0
UNIT DIVISION: 0
UNIT DIVISION: 0
UNIT DIVISION: 0
Unit division: 0
Unit division: 0
Unit division: 0
Unit division: 0
Unit division: 0

## 2017-06-11 LAB — CBC
HCT: 24.3 % — ABNORMAL LOW (ref 39.0–52.0)
HEMATOCRIT: 23.6 % — AB (ref 39.0–52.0)
Hemoglobin: 7.6 g/dL — ABNORMAL LOW (ref 13.0–17.0)
Hemoglobin: 7.3 g/dL — ABNORMAL LOW (ref 13.0–17.0)
MCH: 28.5 pg (ref 26.0–34.0)
MCH: 28.4 pg (ref 26.0–34.0)
MCHC: 31.3 g/dL (ref 30.0–36.0)
MCHC: 30.9 g/dL (ref 30.0–36.0)
MCV: 91 fL (ref 78.0–100.0)
MCV: 91.8 fL (ref 78.0–100.0)
PLATELETS: 331 10*3/uL (ref 150–400)
Platelets: 315 10*3/uL (ref 150–400)
RBC: 2.67 MIL/uL — ABNORMAL LOW (ref 4.22–5.81)
RBC: 2.57 MIL/uL — ABNORMAL LOW (ref 4.22–5.81)
RDW: 19.6 % — AB (ref 11.5–15.5)
RDW: 19.5 % — ABNORMAL HIGH (ref 11.5–15.5)
WBC: 16.1 10*3/uL — AB (ref 4.0–10.5)
WBC: 15 10*3/uL — ABNORMAL HIGH (ref 4.0–10.5)

## 2017-06-11 LAB — HEMOGLOBIN AND HEMATOCRIT, BLOOD
HCT: 23.2 % — ABNORMAL LOW (ref 39.0–52.0)
HEMATOCRIT: 22.9 % — AB (ref 39.0–52.0)
HEMOGLOBIN: 7.5 g/dL — AB (ref 13.0–17.0)
HEMOGLOBIN: 7.2 g/dL — AB (ref 13.0–17.0)

## 2017-06-11 LAB — BASIC METABOLIC PANEL
Anion gap: 7 (ref 5–15)
BUN: 20 mg/dL (ref 6–20)
CALCIUM: 8.8 mg/dL — AB (ref 8.9–10.3)
CO2: 26 mmol/L (ref 22–32)
Chloride: 111 mmol/L (ref 101–111)
Creatinine, Ser: 0.95 mg/dL (ref 0.61–1.24)
GFR calc Af Amer: >60 mL/min (ref >60)
GLUCOSE: 97 mg/dL (ref 65–99)
Potassium: 4.1 mmol/L (ref 3.5–5.1)
SODIUM: 144 mmol/L (ref 135–145)

## 2017-06-11 MED ORDER — SODIUM CHLORIDE 0.9% FLUSH
10.0000 mL | INTRAVENOUS | Status: DC | PRN
  Administered 2017-06-12: 30 mL
  Filled 2017-06-11: qty 40

## 2017-06-11 MED ORDER — DIPHENHYDRAMINE HCL 25 MG PO CAPS
25.0000 mg | ORAL_CAPSULE | Freq: Two times a day (BID) | ORAL | Status: DC | PRN
  Administered 2017-06-11: 25 mg via ORAL
  Filled 2017-06-11: qty 1

## 2017-06-11 MED ORDER — DIPHENHYDRAMINE HCL 25 MG PO CAPS
25.0000 mg | ORAL_CAPSULE | Freq: Four times a day (QID) | ORAL | Status: DC | PRN
  Administered 2017-06-11 (×2): 25 mg via ORAL
  Filled 2017-06-11 (×3): qty 1

## 2017-06-11 NOTE — Consult Note (Addendum)
   [facility] Inpatient Consult   06/11/2017  Juan [name] 04/07/1941 [id number]     Patient screened for [place] Management program due to frequent readmissions. He was admitted from SNF.   Spoke with inpatient RNCM prior to bedside visit. Discussed that the discharge plan likely home. Questionable on the amount of SNF days available.  Discussed potential for [facility] program. However, Juan [name] was discharged from [facility] a while ago due to not allowing staff to visit him at his home.  Went to bedside to speak with Juan [name] about re-engagement with [place] Management program. He is agreeable and [facility] Care Management written consent obtained.   Juan [name] lives with his son.  He states he sometimes has trouble with transportation to MD appointments. Confirms Primary Care MD is Dr. Larose Kells. Juan [name] states his best contact number is [phone]. Reports he has trouble with affording medications. He uses Product/process development scientist on [employer] in [place].  Juan [name] endorses his plan is to return home upon hospital discharge.  Discussed that [place] Management will not interfere or replace services provided by home health. Discussed referral to be made to [facility] team for transition of care, pharmacy and transportation concerns.  Spoke with inpatient RNCM to discuss all of the above notes.  Juan [name] has had x4 hospitalizations in the past 6 months. He has a history of CHF, GI bleeding, pulmonary HTN, CKD, myeloproliferative disease.   Will await to make referral to [facility] team once disposition plans are known.     Marthenia Rolling, MSN-Ed, RN,BSN [facility] Liaison [phone]

## 2017-06-11 NOTE — Progress Notes (Signed)
CM met with the patient and he wants to go home at d/c. Pt was recently in [employer] [facility] and was going to be d/ced. Pt refused PT today. He states he is able to ambulate with a cane. CM encouraged him to work with PT tomorrow. He states he does not have to work with therapy.  Pt states his son is able to assist him at home with ADL's. CM inquired about a number for the son and he states his son doesn't have a number. CM asked about having his daughters number and he refused. CM asked about contacting family if there was an emergency and he said we would have to send the police to his apartment for his son.  CM inquired about [place] services if he discharges home. He says he will think about it. He has in the past refused to let [facility] into the home. CM inquired about this and he states they knocked on his window too loudly and so he wasn't going to let them in. CM will follow.

## 2017-06-11 NOTE — Progress Notes (Signed)
PT Cancellation Note  Patient Details Name: Juan [name] MRN: [id number] DOB: May 29, 1940   Cancelled Treatment:    Reason Eval/Treat Not Completed: Fatigue/lethargy limiting ability to participate Pt reporting he is too tired to participate in therapy today, despite education about importance of mobility. Requesting to hold PT until tomorrow. Will follow up as schedule allows.   [name], PT, DPT  Acute Rehabilitation Services  Pager: [phone]    [name] 06/11/2017, 11:29 AM

## 2017-06-11 NOTE — Care Management Important Message (Signed)
Important Message  Patient Details  Name: Juan [name] MRN: [id number] Date of Birth: March 10, 1941   Medicare Important Message Given:  Yes    Caydance Kuehnle P Delfin Squillace 06/11/2017, 2:29 PM

## 2017-06-11 NOTE — Progress Notes (Signed)
[facility] PROGRESS NOTE  Juan [name] ZOX:[id number] DOB: 1940/10/01 DOA: 06/07/2017  PCP: Colon Branch, MD  Brief History/Interval Summary: 77 year old male with extensive past medical history presented with complaints of rectal bleeding.  Patient required transfusion of 4 units of blood.  Hemoglobin stabilized.  He was seen by gastroenterology.  Patient was initially admitted to the intensive care unit.  Bleeding scan was positive for site of active bleeding in the colon.  Patient underwent sigmoidoscopy and clip placement.  Subsequently transferred to the floor.  Reason for Visit: Lower GI bleed  Consultants: Gastroenterology  Procedures:  Sigmoidoscopy Diverticulosis in the sigmoid colon and in the descending colon. Clips (MR unsafe) were placed. No specimens collected.  Antibiotics: None  Subjective/Interval History: Patient had one episode of stool with blood last night.  None since then.  Hemoglobin is stable this morning.  Patient denies any abdominal pain no nausea or vomiting.  ROS: Denies any chest pain or shortness of breath.  Objective:  Vital Signs  Vitals:   06/10/17 2201 06/11/17 0131 06/11/17 0522 06/11/17 1015  BP: (!) 161/67 (!) 161/75 (!) 161/75 (!) 155/62  Pulse: 77 75 78 75  Resp: 18 18 18 20   Temp: 97.8 F (36.6 C) 98 F (36.7 C) 98 F (36.7 C) 98.4 F (36.9 C)  TempSrc: Oral Oral Oral Oral  SpO2: 98% 100% 100% 97%  Weight:      Height:        Intake/Output Summary (Last 24 hours) at 06/11/2017 1159 Last data filed at 06/11/2017 0135 Gross per 24 hour  Intake 20 ml  Output 600 ml  Net -580 ml   Filed Weights   06/08/17 1611 06/09/17 0500 06/10/17 0500  Weight: 94.5 kg (208 lb 5.4 oz) 97.8 kg (215 lb 9.8 oz) 94 kg (207 lb 3.7 oz)    General appearance: alert, cooperative, appears stated age and no distress Head: Normocephalic, without obvious abnormality, atraumatic Resp: clear to auscultation bilaterally Cardio: regular rate and  rhythm, S1, S2 normal, no murmur, click, rub or gallop GI: soft, non-tender; bowel sounds normal; no masses,  no organomegaly Extremities: extremities normal, atraumatic, no cyanosis or edema Neurologic: No obvious focal neurological deficits.  Lab Results:  Data Reviewed: I have personally reviewed following labs and imaging studies  CBC: Recent Labs  Lab 06/07/17 0916 06/08/17 0110  06/09/17 0724  06/10/17 0439 06/10/17 1321 06/10/17 1931 06/11/17 0045 06/11/17 0451 06/11/17 0903  WBC 17.3* 18.8*  --  14.9*  --  16.1* 16.1*  --   --  16.1*  --   NEUTROABS 11.1*  --   --   --   --   --   --   --   --   --   --   HGB 8.0* 7.8*   < > 7.3*   < > 6.6* 7.2*  7.5* 7.2* 7.5* 7.6* 7.2*  HCT 26.1* 24.8*   < > 22.5*   < > 20.7* 23.3*  23.5* 23.0* 23.2* 24.3* 22.9*  MCV 85.3 86.4  --  89.3  --  90.4 90.0  --   --  91.0  --   PLT 497* 471*  --  330  --  353 333  --   --  331  --    < > = values in this interval not displayed.    Basic Metabolic Panel: Recent Labs  Lab 06/07/17 0916 06/08/17 0110 06/09/17 0724 06/10/17 0439 06/11/17 0451  NA 143 143 145 144  144  K 2.6* 3.2* 3.7 3.6 4.1  CL 109 110 112* 111 111  CO2 25 22 24 26 26   GLUCOSE 164* 117* 96 99 97  BUN 22* 22* 19 20 20   CREATININE 1.28* 1.11 1.19 1.10 0.95  CALCIUM 8.6* 8.5* 8.6* 8.7* 8.8*  MG  --  1.6* 2.0  --   --   PHOS  --   --  3.7  --   --     GFR: Estimated Creatinine Clearance: 80.3 mL/min (by C-G formula based on SCr of 0.95 mg/dL).  Liver Function Tests: Recent Labs  Lab 06/07/17 0916  AST 21  ALT 16*  ALKPHOS 138*  BILITOT 0.6  PROT 4.7*  ALBUMIN 2.3*    Recent Labs  Lab 06/07/17 0916  LIPASE 20   Coagulation Profile: Recent Labs  Lab 06/07/17 0916 06/08/17 0110  INR 1.22 1.23    CBG: Recent Labs  Lab 06/09/17 2330  GLUCAP 110*     Recent Results (from the past 240 hour(s))  MRSA PCR Screening     Status: None   Collection Time: 06/07/17 11:08 PM  Result Value Ref  Range Status   MRSA by PCR NEGATIVE NEGATIVE Final    Comment:        The GeneXpert MRSA Assay (FDA approved for NASAL specimens only), is one component of a comprehensive MRSA colonization surveillance program. It is not intended to diagnose MRSA infection nor to guide or monitor treatment for MRSA infections.   Urine Culture     Status: Abnormal   Collection Time: 06/08/17  8:04 AM  Result Value Ref Range Status   Specimen Description URINE, RANDOM  Final   Special Requests NONE  Final   Culture MULTIPLE SPECIES PRESENT, SUGGEST RECOLLECTION (A)  Final   Report Status 06/09/2017 FINAL  Final  Culture, blood (routine x 2)     Status: None (Preliminary result)   Collection Time: 06/08/17 11:08 AM  Result Value Ref Range Status   Specimen Description BLOOD LEFT ANTECUBITAL  Final   Special Requests   Final    BOTTLES DRAWN AEROBIC ONLY Blood Culture adequate volume   Culture NO GROWTH 2 DAYS  Final   Report Status PENDING  Incomplete  Culture, blood (routine x 2)     Status: None (Preliminary result)   Collection Time: 06/08/17 11:15 AM  Result Value Ref Range Status   Specimen Description BLOOD RIGHT ANTECUBITAL  Final   Special Requests IN PEDIATRIC BOTTLE Blood Culture adequate volume  Final   Culture NO GROWTH 2 DAYS  Final   Report Status PENDING  Incomplete      Radiology Studies: No results found.   Medications:  Scheduled: . sodium chloride flush  3 mL Intravenous Q12H  . tamsulosin  0.4 mg Oral Daily   Continuous: . sodium chloride     GXQ:JJHERD chloride, diphenhydrAMINE, gabapentin, ondansetron **OR** ondansetron (ZOFRAN) IV, sodium chloride flush, sodium chloride flush  Assessment/Plan:  Principal Problem:   Acute GI bleeding Active Problems:   Myeloproliferative disease (HCC)   Chronic diastolic CHF (congestive heart failure) (HCC)   CKD (chronic kidney disease) stage 3, GFR 30-59 ml/min (HCC)   Generalized weakness   Peripheral edema    Paroxysmal SVT (supraventricular tachycardia) (HCC)   Pulmonary hypertension severe   Iron deficiency anemia due to chronic blood loss   Hypokalemia    Lower GI bleed Thought to be diverticular.  Status post bleeding scan.  Status post flexible sigmoidoscopy with clip  placement.  Patient did have one episode of hematochezia last night but none since then.  Hemoglobin stable this morning.  Continue to monitor for another 24 hours.  We will continue his diet for now.  Gastroenterology has been following.  If he continues to have bleeding then he will need to be seen by interventional radiology for embolization.  Hypoxia Patient on Home O2.  Hypoxemia at the time of hospitalization possibly due to acute illness.  Respiratory status is stable.  Essential hypertension Blood pressure noted to be somewhat elevated.  Monitor for now.  Appears that he takes hydralazine at home.  Could consider resuming.  Acute blood loss anemia Status post transfusion stable.  Continue to monitor for now.  Mild acute renal failure Possibly secondary to hypovolemia.  Improved with transfusion and hydration.  History of myelodysplastic syndrome Receives [name] as outpatient.  Concern for sepsis initially No obvious source of infection has been found.  Has been off of antibiotics.  PICC line Patient was hospitalized in December due to Pseudomonas UTI.  PICC line was placed and the patient was treated with IV antibiotics.  He has completed course.  PICC line to be discontinued before discharge.  He was in a skilled nursing facility when he presented to the hospital.  PT evaluation is pending.   DVT Prophylaxis: SCDs    Code Status: Full code Family Communication: Discussed with the patient.  No family at bedside Disposition Plan: Await PT evaluation.  Monitor for additional 24 hours.   LOS: 4 days   [place] [facility] Pager [phone] 06/11/2017, 11:59 AM  If 7PM-7AM, please contact  night-coverage at [email or web address], password [facility]

## 2017-06-12 ENCOUNTER — Inpatient Hospital Stay (HOSPITAL_COMMUNITY): Payer: Medicare Other

## 2017-06-12 LAB — CBC
HEMATOCRIT: 24.2 % — AB (ref 39.0–52.0)
Hemoglobin: 7.4 g/dL — ABNORMAL LOW (ref 13.0–17.0)
MCH: 28.4 pg (ref 26.0–34.0)
MCHC: 30.6 g/dL (ref 30.0–36.0)
MCV: 92.7 fL (ref 78.0–100.0)
Platelets: 332 10*3/uL (ref 150–400)
RBC: 2.61 MIL/uL — AB (ref 4.22–5.81)
RDW: 19.6 % — ABNORMAL HIGH (ref 11.5–15.5)
WBC: 14.3 10*3/uL — AB (ref 4.0–10.5)

## 2017-06-12 MED ORDER — FUROSEMIDE 10 MG/ML IJ SOLN: 20.0000 mg | Freq: Once | INTRAMUSCULAR | Status: DC

## 2017-06-12 MED ORDER — HYDRALAZINE HCL 25 MG PO TABS
25.0000 mg | ORAL_TABLET | Freq: Three times a day (TID) | ORAL | Status: DC
  Administered 2017-06-12 – 2017-06-13 (×2): 25 mg via ORAL
  Filled 2017-06-12 (×2): qty 1

## 2017-06-12 MED ORDER — LORATADINE 10 MG PO TABS: 10.0000 mg | ORAL_TABLET | Freq: Every day | ORAL | 0 refills | Status: DC

## 2017-06-12 NOTE — Progress Notes (Signed)
PT Cancellation Note  Patient Details Name: Juan [name] MRN: [id number] DOB: 1940-06-06   Cancelled Treatment:    Reason Eval/Treat Not Completed: (P) Other (comment)(Pt declined until he is seen by physician for bleeding) PT will follow back later today for evaluation.  Eriyana Sweeten B. Migdalia Dk PT, DPT Acute Rehabilitation  [phone] Pager [phone]     [place] 06/12/2017, 8:57 AM

## 2017-06-12 NOTE — Progress Notes (Signed)
Pt noted to have two large loose brown bowel movements with small bloody clots noted within BM at 0630.  MD notified of patient's bowel movements and AM hemoglobin of 7.4.  No additional orders received.  MD to see patient.

## 2017-06-12 NOTE — Progress Notes (Signed)
[facility] PROGRESS NOTE  Eryc Bodey MHD:[id number] DOB: 03/08/1941 DOA: 06/07/2017  PCP: Colon Branch, MD  Brief History/Interval Summary: 77 year old male with extensive past medical history presented with complaints of rectal bleeding.  Patient required transfusion of 4 units of blood.  Hemoglobin stabilized.  He was seen by gastroenterology.  Patient was initially admitted to the intensive care unit.  Bleeding scan was positive for site of active bleeding in the colon.  Patient underwent sigmoidoscopy and clip placement.  Subsequently transferred to the floor.  Reason for Visit: Lower GI bleed  Consultants: Gastroenterology  Procedures:  Sigmoidoscopy Diverticulosis in the sigmoid colon and in the descending colon. Clips (MR unsafe) were placed. No specimens collected.  Antibiotics: None  Subjective/Interval History: Patient had 2 episodes of bowel movement this morning which was brown include with few blood clots.  No bright red blood per rectum.  Stool was seen by the nursing staff.  Patient complains of some abdominal discomfort.  Seems to be very anxious.  Patient was reassured that this was likely old blood.  Has not had any nausea vomiting.  ROS: Denies any chest pain or shortness of breath.  Objective:  Vital Signs  Vitals:   06/11/17 2155 06/12/17 0141 06/12/17 0500 06/12/17 0954  BP: (!) 151/74 (!) 145/60 (!) 145/59 (!) 148/69  Pulse: 81 61 80 72  Resp: 20 20 20 18   Temp: 99.8 F (37.7 C) 98.1 F (36.7 C) 98.2 F (36.8 C) 98.3 F (36.8 C)  TempSrc: Oral Oral Oral Oral  SpO2: 99% 98% 96% 100%  Weight:      Height:        Intake/Output Summary (Last 24 hours) at 06/12/2017 1032 Last data filed at 06/12/2017 2119 Gross per 24 hour  Intake -  Output 1950 ml  Net -1950 ml   Filed Weights   06/08/17 1611 06/09/17 0500 06/10/17 0500  Weight: 94.5 kg (208 lb 5.4 oz) 97.8 kg (215 lb 9.8 oz) 94 kg (207 lb 3.7 oz)    General appearance: No  distress Resp: Managed air entry at the bases with few crackles.  Mostly clear to auscultation. Cardio: S1-S2 normal regular.  No S3-S4.  No rubs murmurs or bruit GI: Abdomen is soft.  Nontender nondistended.  No masses organomegaly.  Bowel sounds are present normal. Extremities: Mild edema noted bilateral lower extremity Neurologic: No obvious focal neurological deficits.  Lab Results:  Data Reviewed: I have personally reviewed following labs and imaging studies  CBC: Recent Labs  Lab 06/07/17 0916  06/10/17 0439 06/10/17 1321  06/11/17 0045 06/11/17 0451 06/11/17 0903 06/11/17 1525 06/12/17 0437  WBC 17.3*   < > 16.1* 16.1*  --   --  16.1*  --  15.0* 14.3*  NEUTROABS 11.1*  --   --   --   --   --   --   --   --   --   HGB 8.0*   < > 6.6* 7.2*  7.5*   < > 7.5* 7.6* 7.2* 7.3* 7.4*  HCT 26.1*   < > 20.7* 23.3*  23.5*   < > 23.2* 24.3* 22.9* 23.6* 24.2*  MCV 85.3   < > 90.4 90.0  --   --  91.0  --  91.8 92.7  PLT 497*   < > 353 333  --   --  331  --  315 332   < > = values in this interval not displayed.    Basic Metabolic Panel:  Recent Labs  Lab 06/07/17 0916 06/08/17 0110 06/09/17 0724 06/10/17 0439 06/11/17 0451  NA 143 143 145 144 144  K 2.6* 3.2* 3.7 3.6 4.1  CL 109 110 112* 111 111  CO2 25 22 24 26 26   GLUCOSE 164* 117* 96 99 97  BUN 22* 22* 19 20 20   CREATININE 1.28* 1.11 1.19 1.10 0.95  CALCIUM 8.6* 8.5* 8.6* 8.7* 8.8*  MG  --  1.6* 2.0  --   --   PHOS  --   --  3.7  --   --     GFR: Estimated Creatinine Clearance: 80.3 mL/min (by C-G formula based on SCr of 0.95 mg/dL).  Liver Function Tests: Recent Labs  Lab 06/07/17 0916  AST 21  ALT 16*  ALKPHOS 138*  BILITOT 0.6  PROT 4.7*  ALBUMIN 2.3*    Recent Labs  Lab 06/07/17 0916  LIPASE 20   Coagulation Profile: Recent Labs  Lab 06/07/17 0916 06/08/17 0110  INR 1.22 1.23    CBG: Recent Labs  Lab 06/09/17 2330  GLUCAP 110*     Recent Results (from the past 240 hour(s))  MRSA  PCR Screening     Status: None   Collection Time: 06/07/17 11:08 PM  Result Value Ref Range Status   MRSA by PCR NEGATIVE NEGATIVE Final    Comment:        The GeneXpert MRSA Assay (FDA approved for NASAL specimens only), is one component of a comprehensive MRSA colonization surveillance program. It is not intended to diagnose MRSA infection nor to guide or monitor treatment for MRSA infections.   Urine Culture     Status: Abnormal   Collection Time: 06/08/17  8:04 AM  Result Value Ref Range Status   Specimen Description URINE, RANDOM  Final   Special Requests NONE  Final   Culture MULTIPLE SPECIES PRESENT, SUGGEST RECOLLECTION (A)  Final   Report Status 06/09/2017 FINAL  Final  Culture, blood (routine x 2)     Status: None (Preliminary result)   Collection Time: 06/08/17 11:08 AM  Result Value Ref Range Status   Specimen Description BLOOD LEFT ANTECUBITAL  Final   Special Requests   Final    BOTTLES DRAWN AEROBIC ONLY Blood Culture adequate volume   Culture NO GROWTH 3 DAYS  Final   Report Status PENDING  Incomplete  Culture, blood (routine x 2)     Status: None (Preliminary result)   Collection Time: 06/08/17 11:15 AM  Result Value Ref Range Status   Specimen Description BLOOD RIGHT ANTECUBITAL  Final   Special Requests IN PEDIATRIC BOTTLE Blood Culture adequate volume  Final   Culture NO GROWTH 3 DAYS  Final   Report Status PENDING  Incomplete      Radiology Studies: No results found.   Medications:  Scheduled: . sodium chloride flush  3 mL Intravenous Q12H  . tamsulosin  0.4 mg Oral Daily   Continuous: . sodium chloride     OIB:BCWUGQ chloride, diphenhydrAMINE, gabapentin, ondansetron **OR** ondansetron (ZOFRAN) IV, sodium chloride flush, sodium chloride flush  Assessment/Plan:  Principal Problem:   Acute GI bleeding Active Problems:   Myeloproliferative disease (HCC)   Chronic diastolic CHF (congestive heart failure) (HCC)   CKD (chronic kidney  disease) stage 3, GFR 30-59 ml/min (HCC)   Generalized weakness   Peripheral edema   Paroxysmal SVT (supraventricular tachycardia) (HCC)   Pulmonary hypertension severe   Iron deficiency anemia due to chronic blood loss   Hypokalemia  Lower GI bleed Thought to be diverticular.  Status post bleeding scan.  Status post flexible sigmoidoscopy with clip placement.  Patient appears to be passing old blood.  His hemoglobin has been stable.  Patient was reassured.  He did complain of abdominal discomfort so an x-ray was ordered.  No free air was noted.  If he has recurrence of his bleeding he will need to be seen by interventional radiology for embolization.  Hypoxia Patient on Home O2.  Hypoxemia at the time of hospitalization possibly due to acute illness.  Respiratory status is stable.  Chest x-ray suggested pulmonary edema.  He does not have any infectious signs and symptoms.  He is noted to be on Lasix at home.  We will given 1 dose of IV Lasix here in the hospital.  Echocardiogram from May 2018 suggested diastolic dysfunction.  Essential hypertension Blood pressure noted to be somewhat elevated.  Resume hydralazine.    Acute blood loss anemia Status post transfusion.  Hemoglobin is stable.  Continue to monitor for now.  Mild acute renal failure Possibly secondary to hypovolemia.  Improved with transfusion and hydration.  History of myelodysplastic syndrome Receives [name] as outpatient.  Concern for sepsis initially No obvious source of infection has been found.  Has been off of antibiotics.  PICC line Patient was hospitalized in December due to Pseudomonas UTI.  PICC line was placed and the patient was treated with IV antibiotics.  He has completed course.  PICC line to be discontinued before discharge.    He was in a skilled nursing facility when he presented to the hospital.  PT evaluation is pending.   DVT Prophylaxis: SCDs    Code Status: Full code Family Communication:  Discussed with patient. Disposition Plan: Patient plans to go back to live with his son.  And was here earlier today but he left.  No contact information available.  Patient to be given IV Lasix today.  We will plan on discharging him tomorrow.     LOS: 5 days   [place] [facility] Pager [phone] 06/12/2017, 10:32 AM  If 7PM-7AM, please contact night-coverage at [email or web address], password [facility]

## 2017-06-12 NOTE — Evaluation (Signed)
Physical Therapy Evaluation Patient Details Name: Juan [name] MRN: [id number] DOB: August 31, 1940 Today's Date: 06/12/2017   History of Present Illness  77 y.o. male with medical history significant of coronary artery disease, chronic congestive diastolic heart failure, chronic kidney disease, myeloproliferative disorder, chronic lower extremity edema with recent hospitalization for UTI and pyelonephritis is in rehab since that hospitalization. Pt presented with complaints of rectal bleeding.  Patient required transfusion of 4 units of blood.  Hemoglobin stabilized.  He was seen by gastroenterology.  Patient was initially admitted to the intensive care unit.  Bleeding scan was positive for site of active bleeding in the colon.  Patient underwent sigmoidoscopy and clip placement.  Clinical Impression  Pt admitted with above diagnosis. Pt currently with functional limitations due to the deficits listed below (see PT Problem List). Pt arrived from SNF but reports he was near end of rehab stay. Pt was very reluctant to participate with PT today stating that his doctor stated he shouldn't get up until he received results of testing today. Pt eventually convinced to stand up at the [place]. Pt currently minA for bed mobility, transfers from elevated surface and ambulation of 8 feet with RW.  Pt requires 24 hour supervision for safe mobilization either at SNF or with [place] per [facility] nurse note. Pt will benefit from skilled PT acutely to increase their independence and safety with mobility to allow discharge to the venue listed below.       Follow Up Recommendations SNF(or [facility] see [facility] nurse note)    Equipment Recommendations  None recommended by PT    Recommendations for Other Services       Precautions / Restrictions Precautions Precautions: None Restrictions Weight Bearing Restrictions: No      Mobility  Bed Mobility Overal bed mobility: Needs Assistance Bed Mobility: Supine to  Sit;Sit to Supine     Supine to sit: Supervision Sit to supine: Min assist   General bed mobility comments: utilized bedrail to pull to EoB, required minA for management of LE into bed with coming to supine   Transfers Overall transfer level: Needs assistance Equipment used: Rolling walker (2 wheeled) Transfers: Sit to/from Stand Sit to Stand: Min guard;From elevated surface         General transfer comment: pt requested very elevated bed surface for coming sit to stand requiring very little power up  Ambulation/Gait Ambulation/Gait assistance: Min assist Ambulation Distance (Feet): 8 Feet Assistive device: Rolling walker (2 wheeled) Gait Pattern/deviations: Step-through pattern;Decreased step length - right;Decreased step length - left;Shuffle;Trunk flexed;Narrow base of support Gait velocity: decreased Gait velocity interpretation: Below normal speed for age/gender General Gait Details: minA for steadying with RW, refused to go further stating waiting on doctor for results of testing       Balance Overall balance assessment: Needs assistance Sitting-balance support: Feet supported;No upper extremity supported Sitting balance-Leahy Scale: Fair     Standing balance support: Bilateral upper extremity supported Standing balance-Leahy Scale: Poor Standing balance comment: requires B UE support on RW                             Pertinent Vitals/Pain Pain Assessment: Faces Faces Pain Scale: Hurts little more Pain Descriptors / Indicators: Grimacing Pain Intervention(s): Limited activity within patient's tolerance;Monitored during session;Repositioned    Home Living Family/patient expects to be discharged to:: Private residence Living Arrangements: Children Available Help at Discharge: Family;Available 24 hours/day Type of Home: Apartment  Home Access: Level entry     Home Layout: One level Home Equipment: [place] - 2 wheels;Cane - single point;Shower  seat;Bedside commode;Wheelchair - manual;Hospital bed      Prior Function Level of Independence: Needs assistance   Gait / Transfers Assistance Needed: ambulates with RW mostly, cane for very short distances within home,   ADL's / Homemaking Assistance Needed: family helps with getting groceries and transportation to doctor's appointments           Extremity/Trunk Assessment   Upper Extremity Assessment Upper Extremity Assessment: Generalized weakness;LUE deficits/detail LUE Deficits / Details: brace on L wrist, reports surgery 6 months ago    Lower Extremity Assessment Lower Extremity Assessment: Generalized weakness       Communication   Communication: No difficulties  Cognition Arousal/Alertness: Awake/alert Behavior During Therapy: Agitated;Flat affect Overall Cognitive Status: Within Functional Limits for tasks assessed                                 General Comments: pt very short with PT, only agreeing to standing beside bed       General Comments General comments (skin integrity, edema, etc.): pt on 2L O2 via nasal cannula with SaO2 97%O2, pt reports he wears 2L O2 at home however pt not utilizing supplemental O2 when therapist was in room in the morning         Assessment/Plan    PT Assessment Patient needs continued PT services  PT Problem List Decreased strength;Decreased activity tolerance;Decreased balance;Decreased mobility       PT Treatment Interventions DME instruction;Stair training;Functional mobility training;Therapeutic activities;Therapeutic exercise;Balance training;Cognitive remediation;Patient/family education    PT Goals (Current goals can be found in the Care Plan section)  Acute Rehab PT Goals Patient Stated Goal: find out what is wrong PT Goal Formulation: With patient Time For Goal Achievement: 06/26/17 Potential to Achieve Goals: Fair    Frequency Min 3X/week   Barriers to discharge Decreased caregiver support          AM-PAC PT "6 Clicks" Daily Activity  Outcome Measure Difficulty turning over in bed (including adjusting bedclothes, sheets and blankets)?: A Lot Difficulty moving from lying on back to sitting on the side of the bed? : A Little Difficulty sitting down on and standing up from a chair with arms (e.g., wheelchair, bedside commode, etc,.)?: Unable Help needed moving to and from a bed to chair (including a wheelchair)?: A Little Help needed walking in hospital room?: A Little Help needed climbing 3-5 steps with a railing? : A Lot 6 Click Score: 14    End of Session Equipment Utilized During Treatment: Gait belt;Oxygen Activity Tolerance: Treatment limited secondary to agitation(reports awaiting doctors results before ambulating) Patient left: in bed;with call [place]/phone within reach;with bed alarm set Nurse Communication: Mobility status PT Visit Diagnosis: Other abnormalities of gait and mobility (R26.89);Muscle weakness (generalized) (M62.81);Difficulty in walking, not elsewhere classified (R26.2);Unsteadiness on feet (R26.81)    Time: 7616-0737 PT Time Calculation (min) (ACUTE ONLY): 14 min   Charges:   PT Evaluation $PT Eval Moderate Complexity: 1 Mod     PT G Codes:        Coden Franchi B. Migdalia Dk PT, DPT Acute Rehabilitation  [phone] Pager [phone]    [place] 06/12/2017, 4:30 PM

## 2017-06-13 LAB — CULTURE, BLOOD (ROUTINE X 2)
Culture: NO GROWTH
Culture: NO GROWTH
SPECIAL REQUESTS: ADEQUATE
Special Requests: ADEQUATE

## 2017-06-13 MED ORDER — HYDRALAZINE HCL 25 MG PO TABS: 25.0000 mg | ORAL_TABLET | Freq: Three times a day (TID) | ORAL | 0 refills | Status: DC

## 2017-06-13 MED ORDER — TAMSULOSIN HCL 0.4 MG PO CAPS: 0.4000 mg | ORAL_CAPSULE | Freq: Every day | ORAL | 0 refills | Status: AC

## 2017-06-13 MED ORDER — GABAPENTIN 100 MG PO CAPS: 200.0000 mg | ORAL_CAPSULE | Freq: Three times a day (TID) | ORAL | 0 refills | Status: DC | PRN

## 2017-06-13 MED ORDER — COLCHICINE 0.6 MG PO TABS: 0.6000 mg | ORAL_TABLET | Freq: Every day | ORAL | 0 refills | Status: DC

## 2017-06-13 MED ORDER — FUROSEMIDE 40 MG PO TABS: 40.0000 mg | ORAL_TABLET | Freq: Every day | ORAL | 0 refills | Status: DC

## 2017-06-13 MED ORDER — HYDROXYZINE HCL 10 MG PO TABS: 10.0000 mg | ORAL_TABLET | ORAL | 0 refills | Status: DC | PRN

## 2017-06-13 NOTE — Discharge Instructions (Signed)
Rectal Bleeding Rectal bleeding is when blood comes out of the opening of the butt (anus). People with this kind of bleeding may notice bright red blood in their underwear or in the toilet after they poop (have a bowel movement). They may also have dark red or black poop (stool). Rectal bleeding is often a sign that something is wrong. It needs to be checked by a doctor. Follow these instructions at home: Watch for any changes in your condition. Take these actions to help with bleeding and discomfort:  Eat a diet that is high in fiber. This will keep your poop soft so it is easier for you to poop without pushing too hard. Ask your doctor to tell you what foods and drinks are high in fiber.  Drink enough fluid to keep your pee (urine) clear or pale yellow. This also helps keep your poop soft.  Try taking a warm bath. This may help with pain.  Keep all follow-up visits as told by your doctor. This is important.  Get help right away if:  You have new bleeding.  You have more bleeding than before.  You have black or dark red poop.  You throw up (vomit) blood or something that looks like coffee grounds.  You have pain or tenderness in your belly (abdomen).  You have a fever.  You feel weak.  You feel sick to your stomach (nauseous).  You pass out (faint).  You have very bad pain in your butt.  You cannot poop. This information is not intended to replace advice given to you by your health care provider. Make sure you discuss any questions you have with your health care provider. Document Released: 01/14/2011 Document Revised: 10/10/2015 Document Reviewed: 06/30/2015 Elsevier Interactive Patient Education  [employer].

## 2017-06-13 NOTE — Progress Notes (Signed)
Patient discharged home. Discharge information was reviewed with the patient. Patient Verbalized understanding.

## 2017-06-13 NOTE — Progress Notes (Signed)
Notified by Laverda Sorenson bedside RN that pt's PICC has been removed- [facility] called for transport home- pt informed at the bedside and [facility] aware and given transport paperwork.

## 2017-06-13 NOTE — Discharge Summary (Signed)
[facility]  Physician Discharge Summary   Patient ID: Juan [name] MRN: [id number] DOB/AGE: 09/10/1940 77 y.o.  Admit date: 06/07/2017 Discharge date: 06/13/2017  PCP: Colon Branch, MD  DISCHARGE DIAGNOSES:  Principal Problem:   Acute GI bleeding Active Problems:   Myeloproliferative disease ([place])   Chronic diastolic CHF (congestive heart failure) (HCC)   CKD (chronic kidney disease) stage 3, GFR 30-59 ml/min (HCC)   Generalized weakness   Peripheral edema   Paroxysmal SVT (supraventricular tachycardia) (HCC)   Pulmonary hypertension severe   Iron deficiency anemia due to chronic blood loss   Hypokalemia   RECOMMENDATIONS FOR OUTPATIENT FOLLOW UP: 1. Needs blood work in the form of CBC and basic metabolic panel at follow-up in 1 week 2. Outpatient follow-up for bilateral hydronephrosis.   DISCHARGE CONDITION: fair   Filed Weights   06/08/17 1611 06/09/17 0500 06/10/17 0500  Weight: 94.5 kg (208 lb 5.4 oz) 97.8 kg (215 lb 9.8 oz) 94 kg (207 lb 3.7 oz)    INITIAL HISTORY: 77 year old male with extensive past medical history presented with complaints of rectal bleeding.  Patient required transfusion of 4 units of blood.  Hemoglobin stabilized.  He was seen by gastroenterology.  Patient was initially admitted to the intensive care unit.  Bleeding scan was positive for site of active bleeding in the colon.  Patient underwent sigmoidoscopy and clip placement.  Subsequently transferred to the floor.  Consultants: Gastroenterology  Procedures:  Sigmoidoscopy Diverticulosis in the sigmoid colon and in the descending colon. Clips (MR unsafe) were placed. No specimens collected.  Removal of tunneled PICC line.  HOSPITAL COURSE:   Lower GI bleed Thought to be diverticular.  Status post bleeding scan.  Status post flexible sigmoidoscopy with clip placement.    Patient continued to have brown stool with some blood clots.  He was passing old blood.  His hemoglobin  remained stable.  Patient seemed anxious.  He was reassured.  He also mentioned abdominal pain so an x-ray was done which was unremarkable.  No further episodes of rectal bleeding.  Okay for discharge today.  Patient elects to go home rather than go to skilled nursing facility.  Home health will be ordered.  Aspirin has been held due to significant bleed.  PCP to follow-up.  Hypoxia/abnormal chest x-ray Patient on Home O2.  Hypoxemia at the time of hospitalization possibly due to acute illness.  Respiratory status is stable.  Chest x-ray suggested pulmonary edema.  He does not have any infectious signs and symptoms.  He is noted to be on Lasix at home.    Patient was given 1 dose of intravenous Lasix with diuresis.  Lungs sound clear to auscultation today.  He does not have any dyspnea.  He has normal respiratory rate.  Echocardiogram from May 2018 suggested diastolic dysfunction.  Essential hypertension Continue Home medications  Acute blood loss anemia Status post transfusion.    Hemoglobin remains stable posttransfusion.  Mild acute renal failure Possibly secondary to hypovolemia.  Improved with transfusion and hydration.  History of myelodysplastic syndrome Receives [name] as outpatient.  Followed by Dr. Marin Olp.    Concern for sepsis initially No obvious source of infection has been found.  Has been off of antibiotics.  PICC line Patient was hospitalized in December due to Pseudomonas UTI.  PICC line was placed and the patient was treated with IV antibiotics.  He has completed course.  PICC line to be discontinued before discharge.    Stable bilateral hydronephrosis and findings suggestive  of urinary bladder diverticula noted on CT scan.  Will need outpatient follow-up.  His renal function is normal.  He has good urine output.  He was in a skilled nursing facility when he presented to the hospital.    Seen by physical therapy.  They feel that he may benefit from skilled nursing  facility.  However patient declines and wants to go home.  Home health has been ordered.  Okay for discharge home today.  He will be transported by EMS.   PERTINENT LABS:  The results of significant diagnostics from this hospitalization (including imaging, microbiology, ancillary and laboratory) are listed below for reference.    Microbiology: Recent Results (from the past 240 hour(s))  MRSA PCR Screening     Status: None   Collection Time: 06/07/17 11:08 PM  Result Value Ref Range Status   MRSA by PCR NEGATIVE NEGATIVE Final    Comment:        The GeneXpert MRSA Assay (FDA approved for NASAL specimens only), is one component of a comprehensive MRSA colonization surveillance program. It is not intended to diagnose MRSA infection nor to guide or monitor treatment for MRSA infections.   Urine Culture     Status: Abnormal   Collection Time: 06/08/17  8:04 AM  Result Value Ref Range Status   Specimen Description URINE, RANDOM  Final   Special Requests NONE  Final   Culture MULTIPLE SPECIES PRESENT, SUGGEST RECOLLECTION (A)  Final   Report Status 06/09/2017 FINAL  Final  Culture, blood (routine x 2)     Status: None   Collection Time: 06/08/17 11:08 AM  Result Value Ref Range Status   Specimen Description BLOOD LEFT ANTECUBITAL  Final   Special Requests   Final    BOTTLES DRAWN AEROBIC ONLY Blood Culture adequate volume   Culture NO GROWTH 5 DAYS  Final   Report Status 06/13/2017 FINAL  Final  Culture, blood (routine x 2)     Status: None   Collection Time: 06/08/17 11:15 AM  Result Value Ref Range Status   Specimen Description BLOOD RIGHT ANTECUBITAL  Final   Special Requests IN PEDIATRIC BOTTLE Blood Culture adequate volume  Final   Culture NO GROWTH 5 DAYS  Final   Report Status 06/13/2017 FINAL  Final     Labs: Basic Metabolic Panel: Recent Labs  Lab 06/07/17 0916 06/08/17 0110 06/09/17 0724 06/10/17 0439 06/11/17 0451  NA 143 143 145 144 144  K 2.6* 3.2*  3.7 3.6 4.1  CL 109 110 112* 111 111  CO2 25 22 24 26 26   GLUCOSE 164* 117* 96 99 97  BUN 22* 22* 19 20 20   CREATININE 1.28* 1.11 1.19 1.10 0.95  CALCIUM 8.6* 8.5* 8.6* 8.7* 8.8*  MG  --  1.6* 2.0  --   --   PHOS  --   --  3.7  --   --    Liver Function Tests: Recent Labs  Lab 06/07/17 0916  AST 21  ALT 16*  ALKPHOS 138*  BILITOT 0.6  PROT 4.7*  ALBUMIN 2.3*   Recent Labs  Lab 06/07/17 0916  LIPASE 20   CBC: Recent Labs  Lab 06/07/17 0916  06/10/17 0439 06/10/17 1321  06/11/17 0045 06/11/17 0451 06/11/17 0903 06/11/17 1525 06/12/17 0437  WBC 17.3*   < > 16.1* 16.1*  --   --  16.1*  --  15.0* 14.3*  NEUTROABS 11.1*  --   --   --   --   --   --   --   --   --  HGB 8.0*   < > 6.6* 7.2*  7.5*   < > 7.5* 7.6* 7.2* 7.3* 7.4*  HCT 26.1*   < > 20.7* 23.3*  23.5*   < > 23.2* 24.3* 22.9* 23.6* 24.2*  MCV 85.3   < > 90.4 90.0  --   --  91.0  --  91.8 92.7  PLT 497*   < > 353 333  --   --  331  --  315 332   < > = values in this interval not displayed.   BNP: BNP (last 3 results) Recent Labs    11/14/16 0504 12/26/16 1543 01/06/17 0931  BNP 186.6* 409.8* 435.8*    CBG: Recent Labs  Lab 06/09/17 2330  GLUCAP 110*     IMAGING STUDIES Dg Chest 2 View  Result Date: 06/12/2017 CLINICAL DATA:  Patient with productive cough for 1 month. EXAM: CHEST  2 VIEW COMPARISON:  Chest radiograph 01/23/2017. FINDINGS: Central venous catheter tip projects over the superior vena cava. Monitoring leads overlie the patient. Low lung volumes. Stable cardiomegaly. Bilateral heterogeneous pulmonary opacities. No pleural effusion or pneumothorax. Thoracic spine degenerative changes. Redemonstrated heterogeneous appearance of the osseous structures. IMPRESSION: Bilateral heterogeneous pulmonary opacities with considerations including edema or infection. Electronically Signed   By: Lovey Newcomer M.D.   On: 06/12/2017 14:38   Nm Gi Blood Loss  Result Date: 06/08/2017 CLINICAL DATA:  GI  bleeding EXAM: NUCLEAR MEDICINE GASTROINTESTINAL BLEEDING SCAN TECHNIQUE: Sequential abdominal images were obtained following intravenous administration of Tc-59m labeled red blood cells. RADIOPHARMACEUTICALS:  26.1 mCi Tc-35m pertechnetate in-vitro labeled autologous red cells. COMPARISON:  CT abdomen and pelvis 06/07/2017 FINDINGS: Initial normal blood pool distribution of labeled red cells. Beginning at 4 minutes, abnormal tracer localization is identified at approximately the junction of the descending and sigmoid colon. Mild retrograde and antegrade peristalsis of tracer is seen. Findings are compatible with an active source of GI bleeding in the LEFT colon at approximately the descending-sigmoid junction. IMPRESSION: Positive GI bleeding exam with a site of active colonic bleeding identified in the LEFT lower quadrant at approximately the descending-sigmoid junction. Electronically Signed   By: Lavonia Dana M.D.   On: 06/08/2017 15:50   Dg Abd 2 Views  Result Date: 06/12/2017 CLINICAL DATA:  Status post sigmoidoscopy with abdominal pain EXAM: ABDOMEN - 2 VIEW COMPARISON:  None. FINDINGS: Scattered large and small bowel gas is noted. No free air is seen. No obstructive changes are seen. Diffuse changes throughout the bony structures are noted consistent with the known history of myeloproliferative disorder. Endoscopic clips are noted in the left lower quadrant. Increased density is noted in the right lung base incompletely evaluated on this exam. Chest x-ray is recommended for further evaluation. IMPRESSION: No free air is noted. Increased opacity in the right lung base which appear progressed when compare with the prior CT examination. Chest x-ray is recommended for further evaluation. Electronically Signed   By: Inez Catalina M.D.   On: 06/12/2017 12:22   Ct Angio Abd/pel W And/or Wo Contrast  Result Date: 06/07/2017 CLINICAL DATA:  morning around 3 AM he had a jellylike bloody bowel movement. He is  again twice had bloody red bowel movements in the ED since arrival. Patient has never had a colonoscopy before in the past. He denies any rectal bleeding in the past. He denies any rectal pain. Patient is referred for admission for GI bleed. Patient also has h/o aneurysm ascending aorta. EXAM: CTA ABDOMEN AND PELVIS WITH CONTRAST  TECHNIQUE: Multidetector CT imaging of the abdomen and pelvis was performed using the standard protocol during bolus administration of intravenous contrast. Multiplanar reconstructed images and MIPs were obtained and reviewed to evaluate the vascular anatomy. CONTRAST:  128mL ISOVUE-370 IOPAMIDOL (ISOVUE-370) INJECTION 76% COMPARISON:  04/30/2017 FINDINGS: VASCULAR Aorta: Scattered calcified plaque. No aneurysm, dissection, or stenosis. Celiac: Patent without evidence of aneurysm, dissection, vasculitis or significant stenosis. SMA: Patent without evidence of aneurysm, dissection, vasculitis or significant stenosis. Renals: Single left, widely patent. Duplicated right, inferior dominant, both patent. IMA: Patent without evidence of aneurysm, dissection, vasculitis or significant stenosis. Inflow: Mild scattered calcified plaque. No aneurysm, dissection, or stenosis. Proximal Outflow: Bilateral common femoral and visualized portions of the superficial and profunda femoral arteries are patent without evidence of aneurysm, dissection, vasculitis or significant stenosis. Veins: Patent hepatic veins, portal vein, SMV, splenic vein, bilateral renal veins, IVC and iliac venous system. Review of the MIP images confirms the above findings. NON-VASCULAR Lower chest: No acute abnormality. Hepatobiliary: No focal liver abnormality is seen. No gallstones, gallbladder wall thickening, or biliary dilatation. Pancreas: Unremarkable. No pancreatic ductal dilatation or surrounding inflammatory changes. 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 No new  lesion. Adrenals/Urinary Tract: Normal adrenals. 3 cm lower pole right renal cyst as before. Bilateral hydronephrosis and ureterectasis right greater than left, largely stable. Thick-walled urinary bladder with bilateral diverticula right greater than left. Stomach/Bowel: No evidence of active extravasation. Physiologic gastric distention. The small bowel is nondilated. Normal appendix. The colon is nondistended. Innumerable descending and sigmoid diverticula without significant adjacent inflammatory/edematous change evident. Rectal drain is in place. Lymphatic: No definite abdominal or pelvic adenopathy. Reproductive: Mild prostatic enlargement. Other: No ascites.  No free air. Musculoskeletal: There is extensive body wall edema. Diffuse sclerosis of visualized lower thoracic and lumbar spine, ribs, pelvis, and proximal femurs with multiple small lytic lucencies as before. Negative for fracture. IMPRESSION: VASCULAR 1. No evidence of active extravasation into the GI tract. 2. Aortoiliac atherosclerosis without aneurysm or stenosis. NON-VASCULAR 1. Descending and sigmoid diverticulosis. 2. Stable bilateral hydronephrosis and ureterectasis, with distended urinary bladder containing multiple diverticula, suggesting a degree of bladder outlet obstruction. 3. Diffuse osseous changes consistent with known myeloproliferative disorder. Electronically Signed   By: Lucrezia Europe M.D.   On: 06/07/2017 19:25    DISCHARGE EXAMINATION: Vitals:   06/12/17 2202 06/13/17 0200 06/13/17 0500 06/13/17 1023  BP: (!) 146/66 (!) 160/69 (!) 147/71 (!) 149/58  Pulse: 72 81 78 76  Resp: 16 16 16 18   Temp: 98.6 F (37 C) 99.1 F (37.3 C) 99 F (37.2 C) 98.4 F (36.9 C)  TempSrc: Oral Oral Oral Oral  SpO2: 100% 97% 100% 100%  Weight:      Height:       General appearance: alert, cooperative, appears stated age and no distress Resp: clear to auscultation bilaterally Cardio: regular rate and rhythm, S1, S2 normal, no murmur,  click, rub or gallop GI: soft, non-tender; bowel sounds normal; no masses,  no organomegaly  DISPOSITION: Home   Discharge Instructions    Activity as tolerated - No restrictions   Complete by:  As directed    Call MD for:  difficulty breathing, headache or visual disturbances   Complete by:  As directed    Call MD for:  extreme fatigue   Complete by:  As directed    Call MD for:  persistant dizziness or light-headedness   Complete by:  As directed  Call MD for:  persistant nausea and vomiting   Complete by:  As directed    Call MD for:  temperature >100.4   Complete by:  As directed    Diet general   Complete by:  As directed    Discharge instructions   Complete by:  As directed    Your PCP will need to do blood work in 1-2 weeks and then decide when to resume aspirin.  You were cared for by a hospitalist during your hospital stay. If you have any questions about your discharge medications or the care you received while you were in the hospital after you are discharged, you can call the unit and asked to speak with the hospitalist on call if the hospitalist that took care of you is not available. Once you are discharged, your primary care physician will handle any further medical issues. Please note that NO REFILLS for any discharge medications will be authorized once you are discharged, as it is imperative that you return to your primary care physician (or establish a relationship with a primary care physician if you do not have one) for your aftercare needs so that they can reassess your need for medications and monitor your lab values. If you do not have a primary care physician, you can call [phone] for a physician referral.   Increase activity slowly   Complete by:  As directed         Allergies as of 06/13/2017   No Known Allergies     Medication List    STOP taking these medications   aspirin 325 MG tablet     TAKE these medications   acetaminophen 325 MG  tablet Commonly known as:  TYLENOL Take 2 tablets (650 mg total) by mouth every 6 (six) hours as needed for mild pain (or Fever >/= 101).   b complex vitamins tablet Take 1 tablet by mouth daily.   colchicine 0.6 MG tablet Take 1 tablet (0.6 mg total) by mouth daily.   diclofenac sodium 1 % Gel Commonly known as:  VOLTAREN Apply 4 g topically 4 (four) times daily. Apply to bilateral  knees   ferrous gluconate 240 (27 FE) MG tablet Commonly known as:  FERGON Take 240 mg by mouth daily.   fluticasone 50 MCG/ACT nasal spray Commonly known as:  FLONASE Place 2 sprays into both nostrils daily. Reported on 05/02/2015   furosemide 40 MG tablet Commonly known as:  LASIX Take 1 tablet (40 mg total) by mouth daily.   gabapentin 100 MG capsule Commonly known as:  NEURONTIN Take 2 capsules (200 mg total) by mouth 3 (three) times daily as needed (pain). Reported on 08/22/2015   hydrALAZINE 25 MG tablet Commonly known as:  APRESOLINE Take 1 tablet (25 mg total) by mouth 3 (three) times daily.   hydrocerin Crea Apply Eucerin cream to BLE Q day after bathing and roughly towel drying to remove loose skin   hydrOXYzine 10 MG tablet Commonly known as:  ATARAX/VISTARIL Take 1 tablet (10 mg total) by mouth every 4 (four) hours as needed for itching.   loratadine 10 MG tablet Commonly known as:  CLARITIN Take 1 tablet (10 mg total) by mouth daily.   OXYGEN Inhale 2 L into the lungs continuous.   tamsulosin 0.4 MG Caps capsule Commonly known as:  FLOMAX Take 1 capsule (0.4 mg total) by mouth daily after supper. What changed:  when to take this        Follow-up Information  Colon Branch, MD. Schedule an appointment as soon as possible for a visit in 1 week(s).   Specialty:  Internal Medicine Contact information: [address] [place] [address] [phone]           TOTAL DISCHARGE TIME: 35 mins  [facility] Pager  [phone]  06/13/2017, 2:49 PM

## 2017-06-13 NOTE — Care Management Note (Signed)
Case Management Note Marvetta Gibbons RN, BSN Unit 4E-Case Manager--3W weekend coverage [phone]  Patient Details  Name: Juan [name] MRN: [id number] Date of Birth: Feb 15, 1941  Subjective/Objective:  Pt admitted with acute GIB,                   Action/Plan: PTA pt lived at home with son, per previous CM note pt states he has all needed DME, has been active with [name] and [facility] in the past- pt for d/c home today- CM spoke with pt at bedside- per pt he is not active with any HH agency at present, has home 02 with [facility] baseline 2L. Pt is declining both SNF and [facility] services- is stating that he does not want any HH services at this time- does not feel like he "needs" them- also states that he does not want [facility] services at this time. Pt will need [facility] transport home- CM will assist with this once pt has PICC line removed today- will arrange transport home- confirmed with pt son will be at home- pt states son does not have cell phone and can not be called pt however insist that son will be at home for him to return home today. Address confirmed with pt.   Expected Discharge Date:  06/13/17               Expected Discharge Plan:  [facility](From [facility])  In-House Referral:  Clinical Social Work  Discharge planning Services  CM Consult  Post Acute Care Choice:  Home Health Choice offered to:  Patient  DME Arranged:    DME Agency:     HH Arranged:  RN, OT, Refused SNF, Patient Refused [facility] Agency:     Status of Service:  Completed, signed off  If discussed at [place] of Stay Meetings, dates discussed:    Discharge Disposition: home/self care   Additional Comments:  Dawayne Patricia, RN 06/13/2017, 10:06 AM

## 2017-06-14 ENCOUNTER — Other Ambulatory Visit: Payer: Self-pay | Admitting: *Deleted

## 2017-06-14 ENCOUNTER — Telehealth: Payer: Self-pay

## 2017-06-14 DIAGNOSIS — K922 Gastrointestinal hemorrhage, unspecified: Secondary | ICD-10-CM

## 2017-06-14 NOTE — Telephone Encounter (Addendum)
Attempted TCM Hospital follow up call. Unable to reach patient or daughter at any phone number listed in system.

## 2017-06-14 NOTE — Patient Outreach (Signed)
[facility]) Care Management  06/14/2017  Gean Larose 1940-06-17 [id number]    Transition of care initial outreach attempt however unsuccessful. RN tried to reach pt on both numbers(home & mobile) noted in Epic however not successful and unable to leave a HIPAA approved voice message. Will make another outreach attempt tomorrow.  Raina Mina, RN Care Management Coordinator [place] Office [phone]

## 2017-06-14 NOTE — Patient Outreach (Signed)
[facility] Care Management follow up.   Spoke previously at bedside with Mr. Birkeland about enrollment with [facility]. He was agreeable and written consent was obtained at that time.  Chart reviewed. Noted Mr. Riling discharged over the weekend. Noted he declined home health services and [facility] as well with the inpatient RNCM.   Attempted to reach Mr. Meiner by phone to confirm his interest in [facility] since it appeared he changed his mind. However, his son reports he is asleep.  Will make referral to [facility] for follow up.   Marthenia Rolling, MSN-Ed, RN,BSN [facility] Liaison [phone]

## 2017-06-14 NOTE — Telephone Encounter (Signed)
Noted, thx.

## 2017-06-15 ENCOUNTER — Other Ambulatory Visit: Payer: Self-pay | Admitting: *Deleted

## 2017-06-15 NOTE — Patient Outreach (Signed)
[facility]) Care Management  06/15/2017  Juan [name] Dec 19, 1940 [id number]    RN attempted outreach calls to both contact numbers however unsuccessful and unable to leave a voice message. Will continue outreach attempts for pending [facility] services.  Raina Mina, RN Care Management Coordinator [place] Office [phone]

## 2017-06-16 ENCOUNTER — Encounter: Payer: Self-pay | Admitting: *Deleted

## 2017-06-16 ENCOUNTER — Other Ambulatory Visit: Payer: Self-pay | Admitting: *Deleted

## 2017-06-16 NOTE — Patient Outreach (Signed)
[facility]) Care Management  06/16/2017  Barlow Harrison [birth year] [id number]    Transition of care  RN 3rd outreach attempt unsuccessful and RN was unable to leave a voice message. Will send outreach letter and awaiting a response for pending [facility] services.   Raina Mina, RN Care Management Coordinator [place] Office [phone]

## 2017-06-18 DIAGNOSIS — N390 Urinary tract infection, site not specified: Secondary | ICD-10-CM

## 2017-06-18 HISTORY — DX: Urinary tract infection, site not specified: N39.0

## 2017-06-21 MED FILL — FUROSEMIDE 40 MG TAB: 40 | 33 days supply | Qty: 100 | Fill #2

## 2017-06-21 MED FILL — hydrALAZINE HCL 25 MG TABS: 25 | 30 days supply | Qty: 90 | Fill #2

## 2017-06-30 ENCOUNTER — Ambulatory Visit (HOSPITAL_COMMUNITY)
Admission: RE | Admit: 2017-06-30 | Discharge: 2017-06-30 | Disposition: A | Discharge status: Home / Self Care | Payer: Medicare Other | Source: Ambulatory Visit | Attending: Cardiology | Admitting: Cardiology

## 2017-06-30 ENCOUNTER — Encounter (HOSPITAL_COMMUNITY): Payer: Self-pay

## 2017-06-30 VITALS — BP 146/76 | HR 60 | Wt 203.0 lb

## 2017-06-30 DIAGNOSIS — N183 Chronic kidney disease, stage 3 unspecified: Secondary | ICD-10-CM

## 2017-06-30 DIAGNOSIS — I10 Essential (primary) hypertension: Secondary | ICD-10-CM

## 2017-06-30 DIAGNOSIS — I2510 Atherosclerotic heart disease of native coronary artery without angina pectoris: Secondary | ICD-10-CM | POA: Diagnosis not present

## 2017-06-30 DIAGNOSIS — Z7982 Long term (current) use of aspirin: Secondary | ICD-10-CM | POA: Diagnosis not present

## 2017-06-30 DIAGNOSIS — C946 Myelodysplastic disease, not classified: Secondary | ICD-10-CM | POA: Diagnosis not present

## 2017-06-30 DIAGNOSIS — I130 Hypertensive heart and chronic kidney disease with heart failure and stage 1 through stage 4 chronic kidney disease, or unspecified chronic kidney disease: Secondary | ICD-10-CM | POA: Insufficient documentation

## 2017-06-30 DIAGNOSIS — Z79899 Other long term (current) drug therapy: Secondary | ICD-10-CM | POA: Insufficient documentation

## 2017-06-30 DIAGNOSIS — E1122 Type 2 diabetes mellitus with diabetic chronic kidney disease: Secondary | ICD-10-CM | POA: Diagnosis not present

## 2017-06-30 DIAGNOSIS — G4733 Obstructive sleep apnea (adult) (pediatric): Secondary | ICD-10-CM | POA: Diagnosis not present

## 2017-06-30 DIAGNOSIS — Z87891 Personal history of nicotine dependence: Secondary | ICD-10-CM | POA: Diagnosis not present

## 2017-06-30 DIAGNOSIS — I5032 Chronic diastolic (congestive) heart failure: Secondary | ICD-10-CM

## 2017-06-30 DIAGNOSIS — Z9981 Dependence on supplemental oxygen: Secondary | ICD-10-CM | POA: Diagnosis not present

## 2017-06-30 DIAGNOSIS — I5042 Chronic combined systolic (congestive) and diastolic (congestive) heart failure: Secondary | ICD-10-CM | POA: Insufficient documentation

## 2017-06-30 MED ORDER — FUROSEMIDE 40 MG PO TABS: ORAL_TABLET | ORAL | 11 refills | Status: DC

## 2017-06-30 NOTE — Progress Notes (Signed)
Patient ID: Juan [name], male   DOB: 11-06-1940, 77 y.o.   MRN: [id number]    Advanced Heart Failure Clinic Note   PCP: Dr Larose Kells  Primary HF Cardiologist: Dr Haroldine Laws  Primary Cardiologist: Dr Johnsie Cancel   HPI: [name] is a 77 y.o. male with history of Diastolic HF, OSA, HTN, DM2, and myeloproliferative disorder followed by Oncology.   He was seen by onc 10/22/14 for his myeloproliferative disorder. 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   Admitted to [facility] 01/2015 with increased dyspnea and volume overload. Diuresed with lasix and transitioned to lasix 40 mg three times a day.   Admitted twice in  December 2017 with volume overload.  Diuresed with IV lasix the transitioned to lasix 40 mg twice a day. Discharge weight 198 pounds. Discharged to SNF.    Admitted 10/08/16-10/17/16 with volume overload. PA pressure on Echo was 85 mmHg. This was a newly high PA pressure for him. VQ scan negative for PE. ANA, RA and HIV negative. He was diuresed with IV lasix about 15 pounds. Discharge weight was 194 pounds. It was recommended that he go to SNF, but he preferred to go home with home health. He also was started on home 02 at 2L.   Admitted earlier 12/2016 with hip and flank pain. Torsemide and metolazone held at discharge. Per note because patient "wasn't in a CHF flare".   Admitted 01/23/17-01/26/17 with hip, back and foot pain. 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 Dr. Marin Olp saw in the hospital and [name] was started. Weight was down to 185 pounds, his lasix was stopped at discharge.   Admitted in December 2018 with Urosepsis. Discharged to SNF.   Admitted 05/2017 with lower GI bleed. Had scope with clip placed. Aspirin stopped. Discharged on lasix 40 mg daily. He refused  SNF placement.   Today he returns for HF follow up. Overall feeling fine. Denies PND/Orthopnea. SOB with exertion. Having increased leg edema.  Appetite ok. Eating high salt foods such as vienna sausage. Needs assistance with ADLs. No fever or chills. Taking all medications. Requires assistance with transportation. Lives with his son.     Echo 01/21/15 EF 30%, moderate LVH, mild MR, PA peak pressure 59 mm Hg, Normal RV ECHO 12/2015: Ef 55-60%. Grade IDD Echo 09/2016 EF 55-60%, PA pressure 85 mm Hg.    Review of systems complete and found to be negative unless listed in HPI.    SH:  Social History   Socioeconomic History  . Marital status: Widowed    Spouse name: Not on file  . Number of children: 2  . Years of education: Not on file  . Highest education level: Not on file  Social Needs  . Financial resource strain: Not on file  . Food insecurity - worry: Not on file  . Food insecurity - inability: Not on file  . Transportation needs - medical: Not on file  . Transportation needs - non-medical: Not on file  Occupational History  . Occupation: retired     Fish farm manager: RETIRED  Tobacco Use  . Smoking status: Former Smoker    Packs/day: 0.25    Years: 12.00    Pack years: 3.00    Types: Cigarettes    Last attempt to quit: 05/18/1966    Years since  quitting: 51.1  . Smokeless tobacco: Never Used  . Tobacco comment: quit smoking 45 years ago  Substance and Sexual Activity  . Alcohol use: No    Alcohol/week: 0.0 oz  . Drug use: No  . Sexual activity: Yes  Other Topics Concern  . Not on file  Social History Narrative   Moved from [place] 2006   Widow 2007   Son lives with him       Ad[facility]   Former smoker-stopped 1968   Alcohol none   Full Code   FH: Family History  Problem Relation Age of Onset  . Schizophrenia Son   . Mental illness Daughter   . Colon cancer Neg Hx   . Prostate cancer Neg Hx   . Heart attack Neg Hx   . Diabetes Neg  Hx     Past Medical History:  Diagnosis Date  . Allergic rhinitis   . Anemia   . Ascending aortic aneurysm ([place]) 03/2014   4.3cm on CT scan  . CAD (coronary artery disease)    dx elsewheer in past, no documentation. Non-ischemic myoview 2007  . Chronic diastolic CHF (congestive heart failure), NYHA class 2 (HCC)    Normal EF w/ grade 1 dd by echo 12/2015  . Edema    R>L leg, u/s 5-12 neg for DVT  . Hemorrhoid   . History of thrombocytosis   . Hypertension   . Iron deficiency anemia due to chronic blood loss 03/31/2017  . 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 Migraine    "once/wk at least" (07/11/2013)  . Myeloproliferative disease ([place])   . On home oxygen therapy    "2L; all the time" (05/03/2017)  . Pyelonephritis 04/29/2017  . Shortness of breath   . Sinus congestion   . Sleep apnea, obstructive    at some point used CPAP, was d/c  years ago  . Type II diabetes mellitus ([place])     Current Outpatient Medications  Medication Sig Dispense Refill  . acetaminophen (TYLENOL) 325 MG tablet Take 2 tablets (650 mg total) by mouth every 6 (six) hours as needed for mild pain (or Fever >/= 101).    Marland Kitchen aspirin EC 81 MG tablet Take 81 mg by mouth daily.    Marland Kitchen b complex vitamins tablet Take 1 tablet by mouth daily.    . cephALEXin (KEFLEX) 500 MG capsule Take 500 mg by mouth 4 (four) times daily.    . ferrous gluconate (FERGON) 240 (27 FE) MG tablet Take 240 mg by mouth daily.    . furosemide (LASIX) 40 MG tablet Take 40 mg by mouth. Take 2 tabs ('80mg'$ ) in am and 1 tab ('40mg'$ ) in pm    . hydrALAZINE (APRESOLINE) 25 MG tablet Take 1 tablet (25 mg total) by mouth 3 (three) times daily. 90 tablet 0  . OXYGEN Inhale 2 L into the lungs continuous.    . tamsulosin (FLOMAX) 0.4 MG CAPS capsule Take 1 capsule (0.4 mg total) by mouth daily after supper. 30 capsule 0   No current facility-administered medications for this encounter.     Vitals:   06/30/17 0842  BP: (!) 146/76  Pulse: 60  SpO2: 94%    Weight: 203 lb (92.1 kg)   Wt Readings from Last 3 Encounters:  06/30/17 203 lb (92.1 kg)  06/10/17 207 lb 3.7 oz (94 kg)  05/14/17 184 lb (83.5 kg)     PHYSICAL EXAM: General:  Elderly. No resp difficulty.  Arrived in a wheel chair.  HEENT: normal Neck: supple. JVP to jaw. Carotids 2+ bilat; no bruits. No lymphadenopathy or thryomegaly appreciated. Cor: PMI nondisplaced. Regular rate & rhythm. No rubs, gallops or murmurs. Lungs: clear Abdomen: soft, nontender, nondistended. No hepatosplenomegaly. No bruits or masses. Good bowel sounds. Extremities: no cyanosis, clubbing, rash, R and LLE 3+ edema Neuro: alert & orientedx3, cranial nerves grossly intact. moves all 4 extremities w/o difficulty. Affect pleasant   ASSESSMENT & PLAN: 1. Chronic combined systolic and diastolic CHF, EF 37% echo 01/21/15, Normal myoview 2007.  EF now 55-60% in 09/2016. - Volume status elevated. Continue lasix 120 mg daily and add 80 mg in the afternoon.  - Reinforced daily weights and low salt food choices.   2. CKD stage III Reviewed recent BMET on 1/25. Creatinine stable.   3. HTN - Elevated but will increase lasix .    4. 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 - Now seeing Dr. Marin Olp. 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   5. H/O GI Bleed  - No bleeding problems.  S/P sigmoidoscopy with clip placed 05/2017   Follow up in 1 week to reassess volume status. Referred to HFSW for Paramedicine and assistance with transportation.  He is difficult to manage due to poor insight.      Darrick Grinder, NP  06/30/17

## 2017-06-30 NOTE — Patient Instructions (Addendum)
INCREASE Lasix to 180 mg (3 tabs) in am and 80 mg (2 tabs) in pm.  Follow up next week with Oda Kilts PA-C and lab work.  Will refer you to our Heart Failure Paramedicine Program. This program is designed to help support you at home with medication management, vital sign and weight measurements, education about heart failure, and any other needs to support you. A certified EMT will call you to set up your initial appointment in the home, and will be available to you at a weekly basis, free of charge.  Take all medication as prescribed the day of your appointment. Bring all medications with you to your appointment.  Do the following things EVERYDAY: 1) Weigh yourself in the morning before breakfast. Write it down and keep it in a log. 2) Take your medicines as prescribed 3) Eat low salt foods-Limit salt (sodium) to 2000 mg per day.  4) Stay as active as you can everyday 5) Limit all fluids for the day to less than 2 liters

## 2017-06-30 NOTE — Progress Notes (Signed)
CSW referred to assist patient with referral to Paramedicine and discuss Medicare D. Patient is well known to CSW from previous visits. Patient has been reluctant in the past to apply for Medicare D as he states "I don't want anyone prying into my business". CSW has attempted multiple times to share processes of Medicare D applications and most information already known to Medicare over the past. Patient's son [name] escorted patient today to clinic and CSW asked patient to join conversation regarding referral to paramedicine and Medicare D application process. Patient's son was helpful with conversation and patient agreed to follow through with son's help. CSW provided information on SHIP for Medicare D application and will refer to Paramedicine. Patient appears agreeable at this time and acknowledges the need for assistance due to recent decline. CSW will continue to follow and coordinate plan with paramedics. Raquel Sarna, [place], [place]

## 2017-07-01 ENCOUNTER — Encounter: Payer: Self-pay | Admitting: *Deleted

## 2017-07-01 ENCOUNTER — Other Ambulatory Visit: Payer: Self-pay | Admitting: *Deleted

## 2017-07-01 NOTE — Patient Outreach (Signed)
[facility] [facility]) Care Management  07/01/2017  French Kendra Aug 30, 1940 [id number]    Due to unsuccessful outreach calls and no response to the outreach case will be closed. Provider and CMA will be notified.  Raina Mina, RN Care Management Coordinator [place] Office [phone]

## 2017-07-06 ENCOUNTER — Encounter (HOSPITAL_COMMUNITY): Payer: Self-pay

## 2017-07-06 ENCOUNTER — Inpatient Hospital Stay (HOSPITAL_COMMUNITY)
Admission: EM | Admit: 2017-07-06 | Discharge: 2017-07-10 | DRG: 291 | Disposition: A | Discharge status: Home / Self Care | Payer: Medicare Other | Attending: Internal Medicine | Admitting: Internal Medicine

## 2017-07-06 ENCOUNTER — Emergency Department (HOSPITAL_COMMUNITY): Payer: Medicare Other

## 2017-07-06 DIAGNOSIS — Z9981 Dependence on supplemental oxygen: Secondary | ICD-10-CM

## 2017-07-06 DIAGNOSIS — G4733 Obstructive sleep apnea (adult) (pediatric): Secondary | ICD-10-CM | POA: Diagnosis not present

## 2017-07-06 DIAGNOSIS — M109 Gout, unspecified: Secondary | ICD-10-CM | POA: Diagnosis present

## 2017-07-06 DIAGNOSIS — N390 Urinary tract infection, site not specified: Secondary | ICD-10-CM | POA: Diagnosis present

## 2017-07-06 DIAGNOSIS — J80 Acute respiratory distress syndrome: Secondary | ICD-10-CM | POA: Diagnosis not present

## 2017-07-06 DIAGNOSIS — N3000 Acute cystitis without hematuria: Secondary | ICD-10-CM | POA: Diagnosis not present

## 2017-07-06 DIAGNOSIS — I509 Heart failure, unspecified: Secondary | ICD-10-CM

## 2017-07-06 DIAGNOSIS — Z79899 Other long term (current) drug therapy: Secondary | ICD-10-CM | POA: Diagnosis not present

## 2017-07-06 DIAGNOSIS — I130 Hypertensive heart and chronic kidney disease with heart failure and stage 1 through stage 4 chronic kidney disease, or unspecified chronic kidney disease: Secondary | ICD-10-CM | POA: Diagnosis not present

## 2017-07-06 DIAGNOSIS — R079 Chest pain, unspecified: Secondary | ICD-10-CM | POA: Diagnosis not present

## 2017-07-06 DIAGNOSIS — I5033 Acute on chronic diastolic (congestive) heart failure: Secondary | ICD-10-CM | POA: Diagnosis present

## 2017-07-06 DIAGNOSIS — Z7982 Long term (current) use of aspirin: Secondary | ICD-10-CM

## 2017-07-06 DIAGNOSIS — E119 Type 2 diabetes mellitus without complications: Secondary | ICD-10-CM

## 2017-07-06 DIAGNOSIS — Z794 Long term (current) use of insulin: Secondary | ICD-10-CM | POA: Diagnosis not present

## 2017-07-06 DIAGNOSIS — R5383 Other fatigue: Secondary | ICD-10-CM | POA: Diagnosis not present

## 2017-07-06 DIAGNOSIS — E1122 Type 2 diabetes mellitus with diabetic chronic kidney disease: Secondary | ICD-10-CM | POA: Diagnosis present

## 2017-07-06 DIAGNOSIS — R0602 Shortness of breath: Secondary | ICD-10-CM | POA: Diagnosis not present

## 2017-07-06 DIAGNOSIS — Z87891 Personal history of nicotine dependence: Secondary | ICD-10-CM | POA: Diagnosis not present

## 2017-07-06 DIAGNOSIS — D7581 Myelofibrosis: Secondary | ICD-10-CM | POA: Diagnosis present

## 2017-07-06 DIAGNOSIS — N183 Chronic kidney disease, stage 3 unspecified: Secondary | ICD-10-CM | POA: Diagnosis present

## 2017-07-06 DIAGNOSIS — I272 Pulmonary hypertension, unspecified: Secondary | ICD-10-CM | POA: Diagnosis not present

## 2017-07-06 DIAGNOSIS — R309 Painful micturition, unspecified: Secondary | ICD-10-CM | POA: Diagnosis not present

## 2017-07-06 DIAGNOSIS — I5040 Unspecified combined systolic (congestive) and diastolic (congestive) heart failure: Secondary | ICD-10-CM | POA: Diagnosis not present

## 2017-07-06 DIAGNOSIS — R110 Nausea: Secondary | ICD-10-CM | POA: Diagnosis not present

## 2017-07-06 DIAGNOSIS — E1142 Type 2 diabetes mellitus with diabetic polyneuropathy: Secondary | ICD-10-CM | POA: Diagnosis not present

## 2017-07-06 DIAGNOSIS — I2510 Atherosclerotic heart disease of native coronary artery without angina pectoris: Secondary | ICD-10-CM | POA: Diagnosis present

## 2017-07-06 DIAGNOSIS — R52 Pain, unspecified: Secondary | ICD-10-CM | POA: Diagnosis not present

## 2017-07-06 DIAGNOSIS — E1140 Type 2 diabetes mellitus with diabetic neuropathy, unspecified: Secondary | ICD-10-CM | POA: Diagnosis present

## 2017-07-06 DIAGNOSIS — I5032 Chronic diastolic (congestive) heart failure: Secondary | ICD-10-CM | POA: Diagnosis not present

## 2017-07-06 HISTORY — DX: Urinary tract infection, site not specified: N39.0

## 2017-07-06 LAB — CBC WITH DIFFERENTIAL/PLATELET
BLASTS: 0 %
Band Neutrophils: 21 %
Basophils Absolute: 0 10*3/uL (ref 0.0–0.1)
Basophils Relative: 0 %
EOS PCT: 4 %
Eosinophils Absolute: 0.4 10*3/uL (ref 0.0–0.7)
HEMATOCRIT: 31.9 % — AB (ref 39.0–52.0)
HEMOGLOBIN: 9.5 g/dL — AB (ref 13.0–17.0)
LYMPHS ABS: 0.7 10*3/uL (ref 0.7–4.0)
LYMPHS PCT: 7 %
MCH: 27.7 pg (ref 26.0–34.0)
MCHC: 29.8 g/dL — AB (ref 30.0–36.0)
MCV: 93 fL (ref 78.0–100.0)
METAMYELOCYTES PCT: 2 %
MONOS PCT: 9 %
Monocytes Absolute: 0.9 10*3/uL (ref 0.1–1.0)
Myelocytes: 8 %
NEUTROS ABS: 8.5 10*3/uL — AB (ref 1.7–7.7)
NEUTROS PCT: 49 %
NRBC: 0 /100{WBCs}
OTHER: 0 %
Platelets: 254 10*3/uL (ref 150–400)
Promyelocytes Absolute: 0 %
RBC: 3.43 MIL/uL — AB (ref 4.22–5.81)
RDW: 18.1 % — AB (ref 11.5–15.5)
Smear Review: ADEQUATE
WBC: 10.5 10*3/uL (ref 4.0–10.5)

## 2017-07-06 LAB — COMPREHENSIVE METABOLIC PANEL
ALBUMIN: 3.1 g/dL — AB (ref 3.5–5.0)
ALT: 17 U/L (ref 17–63)
ANION GAP: 10 (ref 5–15)
AST: 22 U/L (ref 15–41)
Alkaline Phosphatase: 106 U/L (ref 38–126)
BUN: 47 mg/dL — AB (ref 6–20)
CHLORIDE: 105 mmol/L (ref 101–111)
CO2: 28 mmol/L (ref 22–32)
Calcium: 10.1 mg/dL (ref 8.9–10.3)
Creatinine, Ser: 1.18 mg/dL (ref 0.61–1.24)
GFR calc Af Amer: >60 mL/min (ref >60)
GFR, EST NON AFRICAN AMERICAN: 59 mL/min — AB (ref >60)
Glucose, Bld: 143 mg/dL — ABNORMAL HIGH (ref 65–99)
Potassium: 3.8 mmol/L (ref 3.5–5.1)
Sodium: 143 mmol/L (ref 135–145)
TOTAL PROTEIN: 6 g/dL — AB (ref 6.5–8.1)
Total Bilirubin: 0.4 mg/dL (ref 0.3–1.2)

## 2017-07-06 LAB — URINALYSIS, ROUTINE W REFLEX MICROSCOPIC
Bilirubin Urine: NEGATIVE
Glucose, UA: NEGATIVE mg/dL
Ketones, ur: NEGATIVE mg/dL
Nitrite: POSITIVE — AB
PH: 6 (ref 5.0–8.0)
Protein, ur: 100 mg/dL — AB
Specific Gravity, Urine: 1.01 (ref 1.005–1.030)
Squamous Epithelial / LPF: NONE SEEN

## 2017-07-06 LAB — I-STAT CG4 LACTIC ACID, ED: Lactic Acid, Venous: 0.38 mmol/L — ABNORMAL LOW (ref 0.5–1.9)

## 2017-07-06 LAB — GLUCOSE, CAPILLARY: Glucose-Capillary: 97 mg/dL (ref 65–99)

## 2017-07-06 LAB — I-STAT TROPONIN, ED: TROPONIN I, POC: 0.02 ng/mL (ref 0.00–0.08)

## 2017-07-06 LAB — BRAIN NATRIURETIC PEPTIDE: B Natriuretic Peptide: 419.5 pg/mL — ABNORMAL HIGH (ref 0.0–100.0)

## 2017-07-06 MED ORDER — FERROUS GLUCONATE 240 (27 FE) MG PO TABS
240.0000 mg | ORAL_TABLET | Freq: Every day | ORAL | Status: DC
  Filled 2017-07-06: qty 1

## 2017-07-06 MED ORDER — ONDANSETRON HCL 4 MG PO TABS: 4.0000 mg | ORAL_TABLET | Freq: Four times a day (QID) | ORAL | Status: DC | PRN

## 2017-07-06 MED ORDER — FUROSEMIDE 10 MG/ML IJ SOLN
120.0000 mg | Freq: Once | INTRAVENOUS | Status: AC
  Administered 2017-07-06: 120 mg via INTRAVENOUS
  Filled 2017-07-06: qty 2

## 2017-07-06 MED ORDER — RAMIPRIL 5 MG PO CAPS
5.0000 mg | ORAL_CAPSULE | Freq: Two times a day (BID) | ORAL | Status: DC
  Administered 2017-07-06: 5 mg via ORAL
  Filled 2017-07-06: qty 2
  Filled 2017-07-06 (×2): qty 1

## 2017-07-06 MED ORDER — ENOXAPARIN SODIUM 40 MG/0.4ML ~~LOC~~ SOLN
40.0000 mg | SUBCUTANEOUS | Status: DC
  Administered 2017-07-06 – 2017-07-09 (×4): 40 mg via SUBCUTANEOUS
  Filled 2017-07-06 (×4): qty 0.4

## 2017-07-06 MED ORDER — FERROUS GLUCONATE 324 (38 FE) MG PO TABS
324.0000 mg | ORAL_TABLET | Freq: Every day | ORAL | Status: DC
  Administered 2017-07-06: 324 mg via ORAL
  Filled 2017-07-06 (×2): qty 1

## 2017-07-06 MED ORDER — ACETAMINOPHEN 325 MG PO TABS: 650.0000 mg | ORAL_TABLET | Freq: Four times a day (QID) | ORAL | Status: DC | PRN

## 2017-07-06 MED ORDER — FUROSEMIDE 80 MG PO TABS
120.0000 mg | ORAL_TABLET | Freq: Every day | ORAL | Status: DC
Start: 2017-07-07 — End: 2017-07-07
  Administered 2017-07-07: 120 mg via ORAL
  Filled 2017-07-06: qty 2

## 2017-07-06 MED ORDER — HYDRALAZINE HCL 25 MG PO TABS
25.0000 mg | ORAL_TABLET | Freq: Three times a day (TID) | ORAL | Status: DC
Start: 2017-07-06 — End: 2017-07-07
  Administered 2017-07-06: 25 mg via ORAL
  Filled 2017-07-06: qty 1

## 2017-07-06 MED ORDER — SODIUM CHLORIDE 0.9 % IV SOLN
1.0000 g | Freq: Two times a day (BID) | INTRAVENOUS | Status: AC
  Administered 2017-07-06 – 2017-07-09 (×7): 1 g via INTRAVENOUS
  Filled 2017-07-06 (×7): qty 1

## 2017-07-06 MED ORDER — POLYETHYLENE GLYCOL 3350 17 G PO PACK: 17.0000 g | PACK | Freq: Every day | ORAL | Status: DC | PRN

## 2017-07-06 MED ORDER — SODIUM CHLORIDE 0.9 % IV SOLN
1.0000 g | Freq: Once | INTRAVENOUS | Status: AC
  Administered 2017-07-06: 1 g via INTRAVENOUS
  Filled 2017-07-06: qty 10

## 2017-07-06 MED ORDER — HYDROCODONE-ACETAMINOPHEN 5-325 MG PO TABS
1.0000 | ORAL_TABLET | ORAL | Status: DC | PRN
  Administered 2017-07-07: 2 via ORAL
  Filled 2017-07-06 (×2): qty 2

## 2017-07-06 MED ORDER — ZOLPIDEM TARTRATE 5 MG PO TABS: 5.0000 mg | ORAL_TABLET | Freq: Every evening | ORAL | Status: DC | PRN

## 2017-07-06 MED ORDER — ASPIRIN EC 81 MG PO TBEC
81.0000 mg | DELAYED_RELEASE_TABLET | Freq: Every day | ORAL | Status: DC
  Administered 2017-07-06 – 2017-07-07 (×2): 81 mg via ORAL
  Filled 2017-07-06 (×2): qty 1

## 2017-07-06 MED ORDER — INSULIN ASPART 100 UNIT/ML ~~LOC~~ SOLN: 0.0000 [IU] | Freq: Three times a day (TID) | SUBCUTANEOUS | Status: DC

## 2017-07-06 MED ORDER — FUROSEMIDE 80 MG PO TABS
80.0000 mg | ORAL_TABLET | Freq: Every day | ORAL | Status: DC
  Administered 2017-07-06: 80 mg via ORAL
  Filled 2017-07-06: qty 1

## 2017-07-06 MED ORDER — ACETAMINOPHEN 650 MG RE SUPP: 650.0000 mg | Freq: Four times a day (QID) | RECTAL | Status: DC | PRN

## 2017-07-06 MED ORDER — FUROSEMIDE 10 MG/ML IJ SOLN: 80.0000 mg | Freq: Two times a day (BID) | INTRAMUSCULAR | Status: DC

## 2017-07-06 MED ORDER — ONDANSETRON HCL 4 MG/2ML IJ SOLN: 4.0000 mg | Freq: Four times a day (QID) | INTRAMUSCULAR | Status: DC | PRN

## 2017-07-06 MED ORDER — SODIUM CHLORIDE 0.9 % IV SOLN: 1.0000 g | INTRAVENOUS | Status: DC

## 2017-07-06 MED ORDER — TAMSULOSIN HCL 0.4 MG PO CAPS
0.4000 mg | ORAL_CAPSULE | Freq: Every day | ORAL | Status: DC
  Administered 2017-07-06 – 2017-07-09 (×4): 0.4 mg via ORAL
  Filled 2017-07-06 (×4): qty 1

## 2017-07-06 NOTE — H&P (Signed)
History and Physical    Glennie Rodda GUY:[id number] DOB: [birth year] DOA: 07/06/2017  PCP: Colon Branch, MD   Consultants:  none Patient coming from: Home - lives with his son  Chief Complaint: dyspnea. Increasing LE swelling, weakness, dysuria, lower abdominal pain  HPI: Juan [name] is a 77 y.o. male with medical history significant of diastolic congestive heart failure, severe pulmonary hypertension -on supplemental oxygen at home, iron deficiency anemia, hypertension, coronary artery disease, urinary retention, obstructive sleep apnea who presented to the emergency department complaints of increasing shortness of breath, weakness, worsening of lower extremity swelling, abdominal pain in the suprapubic region and dysuria. The symptoms of worsening edema and shortness of breath with weakness started approximately 2 or 3 weeks ago and later patient developed dysuria with suprapubic pain that is been going on probably for 3 or 4 days. He denied nausea, vomiting, diarrhea, hematuria, chest pain, palpitations.  ED Course: On presentation patient was afebrile, heart rate 64 respirations 18, blood pressure 159/82 Blood work demonstrated elevated BUN-47 and creatinine 1.18, normal white blood cell count 10.5, hemoglobin 9.5 Troponin 0 0.02, lactic acid 0.38 Urinalysis was nitrate positive, had too numerous to count of white blood cells and many bacteria BNP was greater than 400 EKG demonstrated sinus rhythm with LVH and nonspecific ST-T wave changes Chest x-ray revealed cardiomegaly and otherwise no acute findings He was placed on IV Rocephin and started on IV Lasix infusion 120 mg  Review of Systems: As per HPI; otherwise review of systems reviewed and negative.   Ambulatory Status: Ambulates with cane, walker and intermittently uses wheelchair  Past Medical History:  Diagnosis Date  . Allergic rhinitis   . Anemia   . Ascending aortic aneurysm ([name]) 03/2014   4.3cm on CT scan  . CAD (coronary  artery disease)    dx elsewheer in past, no documentation. Non-ischemic myoview 2007  . Chronic diastolic CHF (congestive heart failure), NYHA class 2 (HCC)    Normal EF w/ grade 1 dd by echo 12/2015  . Edema    R>L leg, u/s 5-12 neg for DVT  . Hemorrhoid   . History of thrombocytosis   . Hypertension   . Iron deficiency anemia due to chronic blood loss 03/31/2017  . 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 Migraine    "once/wk at least" (07/11/2013)  . Myeloproliferative disease ([place])   . On home oxygen therapy    "2L; all the time" (05/03/2017)  . Pyelonephritis 04/29/2017  . Shortness of breath   . Sinus congestion   . Sleep apnea, obstructive    at some point used CPAP, was d/c  years ago  . Type II diabetes mellitus ([place])     Past Surgical History:  Procedure Laterality Date  . FLEXIBLE SIGMOIDOSCOPY N/A 06/08/2017   Procedure: FLEXIBLE SIGMOIDOSCOPY;  Surgeon: Carol Ada, MD;  Location: [place];  Service: Endoscopy;  Laterality: N/A;  . IR FLUORO GUIDE CV LINE RIGHT  05/07/2017  . IR FLUORO RM 30-60 MIN  05/07/2017  . IR US GUIDE VASC ACCESS RIGHT  05/07/2017  . TOE SURGERY Right    "tried to straighten out big toe" (07/11/2013)    Social History   Socioeconomic History  . Marital status: Widowed    Spouse name: Not on file  . Number of children: 2  . Years of education: Not on file  . Highest education level: Not on file  Social Needs  . Financial resource strain: Not on file  . Food insecurity -  worry: Not on file  . Food insecurity - inability: Not on file  . Transportation needs - medical: Not on file  . Transportation needs - non-medical: Not on file  Occupational History  . Occupation: retired     Fish farm manager: RETIRED  Tobacco Use  . Smoking status: Former Smoker    Packs/day: 0.25    Years: 12.00    Pack years: 3.00    Types: Cigarettes    Last attempt to quit: 05/18/1966    Years since quitting: 51.1  . Smokeless tobacco: Never Used  . Tobacco  comment: quit smoking 45 years ago  Substance and Sexual Activity  . Alcohol use: No    Alcohol/week: 0.0 oz  . Drug use: No  . Sexual activity: Yes  Other Topics Concern  . Not on file  Social History Narrative   Moved from [place] 2006   Widow 2007   Son lives with him       Admitted to [facility] 05/08/17   Former smoker-stopped 1968   Alcohol none   Full Code    No Known Allergies  Family History  Problem Relation Age of Onset  . Schizophrenia Son   . Mental illness Daughter   . Colon cancer Neg Hx   . Prostate cancer Neg Hx   . Heart attack Neg Hx   . Diabetes Neg Hx     Prior to Admission medications   Medication Sig Start Date End Date Taking? Authorizing Provider  acetaminophen (TYLENOL) 325 MG tablet Take 2 tablets (650 mg total) by mouth every 6 (six) hours as needed for mild pain (or Fever >/= 101). 05/08/17   Rai, Vernelle Emerald, MD  aspirin EC 81 MG tablet Take 81 mg by mouth daily.    [provider]  b complex vitamins tablet Take 1 tablet by mouth daily.    [provider]  cephALEXin (KEFLEX) 500 MG capsule Take 500 mg by mouth 4 (four) times daily.    [provider]  ferrous gluconate (FERGON) 240 (27 FE) MG tablet Take 240 mg by mouth daily.    [provider]  furosemide (LASIX) 40 MG tablet Take 120 mg (3 tabs) in am and 80 mg (2 tabs) in pm 06/30/17   Clegg, Amy D, NP  hydrALAZINE (APRESOLINE) 25 MG tablet Take 1 tablet (25 mg total) by mouth 3 (three) times daily. 06/13/17 09/11/17  Bonnielee Haff, MD  OXYGEN Inhale 2 L into the lungs continuous.    [provider]  tamsulosin (FLOMAX) 0.4 MG CAPS capsule Take 1 capsule (0.4 mg total) by mouth daily after supper. 06/13/17   Bonnielee Haff, MD    Physical Exam: Vitals:   07/06/17 1215 07/06/17 1515 07/06/17 1600 07/06/17 1630  BP: (!) 159/82 (!) 158/76 (!) 154/61 (!) 153/62  Pulse: 80 74 63 65  Resp: 18  16 18   Temp:      TempSrc:      SpO2:  100% 100% 100% 99%     General:  Appears calm and comfortable Eyes: PERRLA, sckerae unicteric, EOM intact,  ENT: normal external ear canals, oral mucous membranes moist and intact, no nasal discharge Neck: no lympnadenopathy, masses or thyromegaly Cardiovascular: RRR, no murmurs, gallops or rubs, large bilateral 3+ pitting LE edema, diminished dorsalis pedis pulses bilaterally. Respiratory: CTA bilaterally, no adventitious sounds auscultated. Normal respiratory effort. Abdomen: Abdomen mildly distended, tender in the suprapubic region, BS (+) 4, no masses or organomegaly appreciated Skin: no  rash or induration seen on limited exam Musculoskeletal: no joint deformities observed, active full ROM  Psychiatric: grossly normal mood and affect, speech fluent and appropriate Neurologic: CN II-XII grossly normal, no focal deficit visualized, moves all extremities independently,sensation intact.      Radiological Exams on Admission: Dg Chest 2 View  Result Date: 07/06/2017 CLINICAL DATA:  Chest pain EXAM: CHEST  2 VIEW COMPARISON:  06/12/2017 FINDINGS: Chronic cardiomegaly. Stable mediastinal contours. There is mild generalized interstitial coarsening that is chronic. Asymmetric lucency at the right base with air trapping seen on 2018 abdominal CT. Generalized bony sclerosis in this patient with myelofibrosis. No acute osseous finding. IMPRESSION: 1. No acute finding when compared to priors. 2. Chronic cardiomegaly. Electronically Signed   By: Monte Fantasia M.D.   On: 07/06/2017 14:04    EKG: Independently reviewed.  NSR with rate, LVH, nonspecific ST-T wave changes  Labs on Admission: I have personally reviewed the available labs and imaging studies at the time of the admission.  Pertinent labs:      Assessment/Plan Principal Problem:   Acute exacerbation of CHF (congestive heart failure) (HCC) Active Problems:   Obstructive sleep apnea   Chronic diastolic CHF (congestive heart  failure) (HCC)   CKD (chronic kidney disease) stage 3, GFR 30-59 ml/min (HCC)   Type II diabetes mellitus ([place])   Pulmonary hypertension severe   UTI (urinary tract infection)   Acute extubation of chronic diastolic congestive heart failure in patient with known severe pulmonary hypertension Patient is on 80 mg of Lasix at night and 120 mg in the morning at home.  Started on IV Lasix 120 mg in the ED and will continue with 80 mg twice daily Admit to telemetry unit  Monitor for intake and output, restrict fluids, maintain low-salt diet Start low-dose ACE inhibitor Continue supplemental oxygen  UTI - medical symptoms and UA endorse the diagnosis Continue Rocephin, follow culture results and adjust antibiotic choice if needed  Diabetes mellitus type 2 -currently diet controlled Maintain on a controlled diet, follow CBGs and utilize sliding scale insulin if needed We will check hemoglobin A1c  CKD stage III - creatinine is stable, continue closely follow renal function Expect renal function deteriorates now on high dose of diuretic before it gets better  Obstructive sleep apnea Will order CPAP at night per respiratory    DVT prophylaxis: Lovenox Code Status: Full - confirmed with patient/family Family Communication: None Disposition Plan: Home once clinically improved Consults called: None Admission status: Patient   York Grice PA-C [phone] [facility]  If note is complete, please contact covering daytime or nighttime physician. [email or web address] Password [facility]  07/06/2017, 4:41 PM

## 2017-07-06 NOTE — Progress Notes (Signed)
Pharmacy Antibiotic Note  Juan [name] is a 77 y.o. male admitted on 07/06/2017 with dysuria and dark colored urine.  Pharmacy has been consulted for cefepime dosing for UTI. Patient has a recent hx of Pseudomonas UTI in 04/2017. He received ceftriaxone 1 g IV at 15:33. UA+ and culture is pending, afebrile, WBC are normal.  Plan: Cefepime 1 g IV q12h Monitor renal function, clinical progress, cxs     Temp (24hrs), Avg:97.9 F (36.6 C), Min:97.6 F (36.4 C), Max:98.1 F (36.7 C)  Recent Labs  Lab 07/06/17 1514 07/06/17 1528  WBC 10.5  --   CREATININE 1.18  --   LATICACIDVEN  --  0.38*    Estimated Creatinine Clearance: 64.6 mL/min (by C-G formula based on SCr of 1.18 mg/dL).    No Known Allergies   Thank you for allowing pharmacy to be a part of this patient's care.  Renold Genta, PharmD, BCPS Clinical Pharmacist Phone for today - [place] - [phone] 07/06/2017 10:01 PM

## 2017-07-06 NOTE — ED Notes (Signed)
Pt stated he had to go to that bathroom when coming back to pt with urinal pt stated that he is all right and that he does not need to go to the bathroom anymore. I asking pt did he wet himself he stated yes he did he has on a brief. I ask pt if we could to to the bathroom and get him change and dry. Pt stated no he is all right and do not want to get up until he gets back in a room. I told pt I would not want him out here sitting wet where he can developed a rash or something else. Pt stated I am all right thank you.

## 2017-07-06 NOTE — ED Provider Notes (Signed)
[facility] EMERGENCY DEPARTMENT Provider Note   CSN: [id number] Arrival date & time: 07/06/17  6010     History   Chief Complaint Chief Complaint  Patient presents with  . Recurrent UTI    HPI Juan [name] is a 77 y.o. male.  HPI   Patient is a 77 year old male with extensive past medical history.  Patient has CHF, on Lasix 120 mg the morning and 80 in the evening.  Has a history of pyelonephritis.  Here today with dark urine, urinary frequency and burning with urination.  In addition he has had swelling in bilateral extremities and has increasing shortness of breath.  Past Medical History:  Diagnosis Date  . Allergic rhinitis   . Anemia   . Ascending aortic aneurysm ([place]) 03/2014   4.3cm on CT scan  . CAD (coronary artery disease)    dx elsewheer in past, no documentation. Non-ischemic myoview 2007  . Chronic diastolic CHF (congestive heart failure), NYHA class 2 (HCC)    Normal EF w/ grade 1 dd by echo 12/2015  . Edema    R>L leg, u/s 5-12 neg for DVT  . Hemorrhoid   . History of thrombocytosis   . Hypertension   . Iron deficiency anemia due to chronic blood loss 03/31/2017  . 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 Migraine    "once/wk at least" (07/11/2013)  . Myeloproliferative disease ([place])   . On home oxygen therapy    "2L; all the time" (05/03/2017)  . Pyelonephritis 04/29/2017  . Shortness of breath   . Sinus congestion   . Sleep apnea, obstructive    at some point used CPAP, was d/c  years ago  . Type II diabetes mellitus [facility])     Patient Active Problem List   Diagnosis Date Noted  . Acute GI bleeding 06/07/2017  . Hypokalemia   . Iron deficiency anemia due to chronic blood loss 03/31/2017  . 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 Urinary retention 01/24/2017  . 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 Elevated troponin 11/14/2016  . Recurrent pneumonia 10/31/2016  . Pulmonary hypertension severe   . Sprain of left  rotator cuff capsule   . Hyperkalemia   . Paroxysmal SVT (supraventricular tachycardia) ([place]) 04/20/2016  . Type II diabetes mellitus ([place]) 04/20/2016  . Gout 04/20/2016  . B12 deficiency 04/20/2016  . 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 Hydronephrosis   . Thrombocytosis ([place]) 04/07/2016  . Pressure ulcer stage II 12/31/2015  . T wave inversion in EKG 12/29/2015  . PCP NOTES >>>>>>>>>>>>>>>>> 05/02/2015  . Peripheral edema   . Thoracic aortic aneurysm ([place]) 04/26/2014  . CKD (chronic kidney disease) stage 3, GFR 30-59 ml/min (HCC) 04/26/2014  . Generalized weakness 04/06/2014  . Venous stasis dermatitis of both lower extremities   . Morbid obesity due to excess calories ([place]) 03/29/2014  . 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 Poor compliance with advise  05/05/2012  . Annual physical exam 01/14/2012  . Weight loss 01/14/2012  . BPH (benign prostatic hyperplasia) 01/14/2012  . Myeloproliferative disease ([place]) 09/07/2006  . Obstructive sleep apnea 09/07/2006  . Hypertensive heart disease with chronic diastolic congestive heart failure ([place]) 09/07/2006  . Coronary atherosclerosis 09/07/2006  . Chronic diastolic CHF (congestive heart failure) () 09/07/2006  . Allergic rhinitis 09/07/2006    Past Surgical History:  Procedure Laterality Date  . FLEXIBLE SIGMOIDOSCOPY N/A 06/08/2017   Procedure: FLEXIBLE SIGMOIDOSCOPY;  Surgeon: Carol Ada, MD;  Location: [facility]  ENDOSCOPY;  Service: Endoscopy;  Laterality: N/A;  . IR FLUORO GUIDE CV LINE RIGHT  05/07/2017  . IR FLUORO RM 30-60 MIN  05/07/2017  . IR US GUIDE VASC ACCESS RIGHT  05/07/2017  . TOE SURGERY Right    "tried to straighten out big toe" (07/11/2013)       Home Medications    Prior to Admission medications   Medication Sig Start Date End Date Taking? Authorizing Provider  acetaminophen (TYLENOL) 325 MG tablet Take 2 tablets (650 mg total) by mouth every 6 (six) hours as needed for mild  pain (or Fever >/= 101). 05/08/17   Rai, Vernelle Emerald, MD  aspirin EC 81 MG tablet Take 81 mg by mouth daily.    [provider]  b complex vitamins tablet Take 1 tablet by mouth daily.    [provider]  cephALEXin (KEFLEX) 500 MG capsule Take 500 mg by mouth 4 (four) times daily.    [provider]  ferrous gluconate (FERGON) 240 (27 FE) MG tablet Take 240 mg by mouth daily.    [provider]  furosemide (LASIX) 40 MG tablet Take 120 mg (3 tabs) in am and 80 mg (2 tabs) in pm 06/30/17   Clegg, Amy D, NP  hydrALAZINE (APRESOLINE) 25 MG tablet Take 1 tablet (25 mg total) by mouth 3 (three) times daily. 06/13/17 09/11/17  Bonnielee Haff, MD  OXYGEN Inhale 2 L into the lungs continuous.    [provider]  tamsulosin (FLOMAX) 0.4 MG CAPS capsule Take 1 capsule (0.4 mg total) by mouth daily after supper. 06/13/17   Bonnielee Haff, MD    Family History Family History  Problem Relation Age of Onset  . Schizophrenia Son   . Mental illness Daughter   . Colon cancer Neg Hx   . Prostate cancer Neg Hx   . Heart attack Neg Hx   . Diabetes Neg Hx     Social History Social History   Tobacco Use  . Smoking status: Former Smoker    Packs/day: 0.25    Years: 12.00    Pack years: 3.00    Types: Cigarettes    Last attempt to quit: 05/18/1966    Years since quitting: 51.1  . Smokeless tobacco: Never Used  . Tobacco comment: quit smoking 45 years ago  Substance Use Topics  . Alcohol use: No    Alcohol/week: 0.0 oz  . Drug use: No     Allergies   Patient has no known allergies.   Review of Systems Review of Systems  Constitutional: Positive for fatigue. Negative for activity change and fever.  HENT: Negative for ear pain.   Eyes: Negative for pain and visual disturbance.  Respiratory: Positive for shortness of breath. Negative for cough.   Cardiovascular: Negative for chest pain.  Gastrointestinal: Positive for nausea. Negative for diarrhea and  vomiting.  Genitourinary: Positive for dysuria and frequency. Negative for difficulty urinating.  Musculoskeletal: Positive for back pain. Negative for gait problem.  Skin: Negative for rash.  Allergic/Immunologic: Negative for immunocompromised state.  Neurological: Positive for light-headedness. Negative for dizziness.  Psychiatric/Behavioral: Negative for agitation and behavioral problems.  All other systems reviewed and are negative.    Physical Exam Updated Vital Signs BP (!) 159/82   Pulse 80   Temp 97.6 F (36.4 C) (Oral)   Resp 18   SpO2 100%   Physical Exam  Constitutional: He is oriented to person, place, and time. He appears well-nourished.  HENT:  Head:  Normocephalic.  Eyes: Conjunctivae are normal.  Cardiovascular: Normal rate and regular rhythm.  Pulmonary/Chest: Effort normal.  Mild crackles at bases.  Abdominal: Soft. He exhibits no distension. There is no tenderness.  Musculoskeletal: He exhibits edema.  +3 pitting edema bilateral lower extremity's.  Neurological: He is oriented to person, place, and time.  Skin: Skin is warm and dry. He is not diaphoretic.  Psychiatric: He has a normal mood and affect. His behavior is normal.     ED Treatments / Results  Labs (all labs ordered are listed, but only abnormal results are displayed) Labs Reviewed  URINALYSIS, ROUTINE W REFLEX MICROSCOPIC - Abnormal; Notable for the following components:      Result Value   APPearance HAZY (*)    Hgb urine dipstick SMALL (*)    Protein, ur 100 (*)    Nitrite POSITIVE (*)    Leukocytes, UA LARGE (*)    Bacteria, UA MANY (*)    All other components within normal limits  URINE CULTURE  COMPREHENSIVE METABOLIC PANEL  CBC WITH DIFFERENTIAL/PLATELET  BRAIN NATRIURETIC PEPTIDE  I-STAT TROPONIN, ED  I-STAT CG4 LACTIC ACID, ED    EKG  EKG Interpretation None       Radiology No results found.  Procedures Procedures (including critical care time)  Medications  Ordered in ED Medications  cefTRIAXone (ROCEPHIN) 1 g in sodium chloride 0.9 % 100 mL IVPB (not administered)  furosemide (LASIX) 120 mg in dextrose 5 % 50 mL IVPB (not administered)     Initial Impression / Assessment and Plan / ED Course  I have reviewed the triage vital signs and the nursing notes.  Pertinent labs & imaging results that were available during my care of the patient were reviewed by me and considered in my medical decision making (see chart for details).      Patient is a 77 year old male with extensive past medical history.  Patient has CHF, on Lasix 120 mg the morning and 80 in the evening.  Has a history of pyelonephritis.  Here today with dark urine, urinary frequency and burning with urination.  In addition he has had swelling in bilateral extremities and has increasing shortness of breath.  1:28 PM Patient has multiple issues today.  Primarily he is here for urinary symptoms, his UA shows nitrate positive urine.  Urine sent for culture.  However in addition patient also has shortness of breath with bilateral pitting edema.  Patient has Artie on extensive Lasix at home.  We will give Lasix here and  admit for diuresis.  Final Clinical Impressions(s) / ED Diagnoses   Final diagnoses:  None    ED Discharge Orders    None       Geoge Lawrance, Fredia Sorrow, MD 07/10/17 1429

## 2017-07-06 NOTE — ED Triage Notes (Signed)
Pt comes via [facility] from home c/o  bladder infection since yesterday, dysuria, dark colored urine

## 2017-07-07 ENCOUNTER — Other Ambulatory Visit: Payer: Self-pay

## 2017-07-07 ENCOUNTER — Encounter (HOSPITAL_COMMUNITY): Payer: Self-pay

## 2017-07-07 ENCOUNTER — Encounter (HOSPITAL_COMMUNITY): Payer: Self-pay | Admitting: General Practice

## 2017-07-07 DIAGNOSIS — N183 Chronic kidney disease, stage 3 (moderate): Secondary | ICD-10-CM

## 2017-07-07 DIAGNOSIS — I5032 Chronic diastolic (congestive) heart failure: Secondary | ICD-10-CM

## 2017-07-07 DIAGNOSIS — I5033 Acute on chronic diastolic (congestive) heart failure: Secondary | ICD-10-CM

## 2017-07-07 DIAGNOSIS — I272 Pulmonary hypertension, unspecified: Secondary | ICD-10-CM

## 2017-07-07 DIAGNOSIS — G4733 Obstructive sleep apnea (adult) (pediatric): Secondary | ICD-10-CM

## 2017-07-07 LAB — URINE CULTURE

## 2017-07-07 LAB — BASIC METABOLIC PANEL
Anion gap: 13 (ref 5–15)
BUN: 51 mg/dL — AB (ref 6–20)
CO2: 27 mmol/L (ref 22–32)
CREATININE: 1.25 mg/dL — AB (ref 0.61–1.24)
Calcium: 9.8 mg/dL (ref 8.9–10.3)
Chloride: 102 mmol/L (ref 101–111)
GFR calc Af Amer: >60 mL/min (ref >60)
GFR, EST NON AFRICAN AMERICAN: 55 mL/min — AB (ref >60)
GLUCOSE: 109 mg/dL — AB (ref 65–99)
POTASSIUM: 3.9 mmol/L (ref 3.5–5.1)
Sodium: 142 mmol/L (ref 135–145)

## 2017-07-07 LAB — PATHOLOGIST SMEAR REVIEW

## 2017-07-07 LAB — GLUCOSE, CAPILLARY
GLUCOSE-CAPILLARY: 117 mg/dL — AB (ref 65–99)
Glucose-Capillary: 108 mg/dL — ABNORMAL HIGH (ref 65–99)
Glucose-Capillary: 102 mg/dL — ABNORMAL HIGH (ref 65–99)

## 2017-07-07 LAB — CBC
HEMATOCRIT: 30.3 % — AB (ref 39.0–52.0)
Hemoglobin: 9 g/dL — ABNORMAL LOW (ref 13.0–17.0)
MCH: 28 pg (ref 26.0–34.0)
MCHC: 29.7 g/dL — AB (ref 30.0–36.0)
MCV: 94.1 fL (ref 78.0–100.0)
PLATELETS: 229 10*3/uL (ref 150–400)
RBC: 3.22 MIL/uL — ABNORMAL LOW (ref 4.22–5.81)
RDW: 18.1 % — AB (ref 11.5–15.5)
WBC: 10.4 10*3/uL (ref 4.0–10.5)

## 2017-07-07 MED ORDER — POTASSIUM CHLORIDE CRYS ER 20 MEQ PO TBCR
40.0000 meq | EXTENDED_RELEASE_TABLET | Freq: Two times a day (BID) | ORAL | Status: DC
  Administered 2017-07-07 – 2017-07-08 (×3): 40 meq via ORAL
  Filled 2017-07-07 (×3): qty 2

## 2017-07-07 MED ORDER — FUROSEMIDE 10 MG/ML IJ SOLN
60.0000 mg | Freq: Two times a day (BID) | INTRAMUSCULAR | Status: DC
  Administered 2017-07-07 (×2): 60 mg via INTRAVENOUS
  Filled 2017-07-07 (×2): qty 6

## 2017-07-07 NOTE — Progress Notes (Signed)
PROGRESS NOTE    Rmani Kellogg  YIF:[id number] DOB: 05/28/1940 DOA: 07/06/2017 PCP: Colon Branch, MD  Brief Narrative:Perle Vigen is a 77 y.o. male with medical history significant of diastolic congestive heart failure, severe pulmonary hypertension -on supplemental oxygen at home, iron deficiency anemia, hypertension, coronary artery disease, urinary retention, obstructive sleep apnea who presented to the emergency department complaints of increasing shortness of breath, weakness, worsening of lower extremity swelling, abdominal pain in the suprapubic region and dysuria In ED, found to have 3+ edema and Abnormal UA  Assessment & Plan:   Acute extubation of chronic diastolic congestive heart failure in patient with known severe pulmonary hypertension -ECHO with EF 55-60% from 09/2016 -more like right sided heart failure, given 3+ edema and clear lungs -diurese with IV lasix today -Monitor weight and urine output -Stop ACE inhibitor to allow more room for diuresis  UTI -  abnormal urinalysis with symptoms of UTI  -Currently on cefepime, will continue, follow-up urine cultures   Diabetes mellitus type 2 -currently diet controlled -Continue sliding scale insulin, follow-up hemoglobin A1c  CKD stage III - -creatinine stable at baseline, stopped ACE inhibitor  -Monitor with diuresis   Obstructive sleep apnea -Continue CPAP daily at bedtime  Myelofibrosis -Follow-up with oncology Dr.Ennever  DVT prophylaxis: Lovenox Code Status: Full Family Communication: None Disposition Plan: Home once clinically improved    Consultants:      Procedures:   Antimicrobials:  Antibiotics Given (last 72 hours)    Date/Time Action Medication Dose Rate   07/06/17 1533 New Bag/Given   cefTRIAXone (ROCEPHIN) 1 g in sodium chloride 0.9 % 100 mL IVPB 1 g 200 mL/hr   07/06/17 2332 New Bag/Given   ceFEPIme (MAXIPIME) 1 g in sodium chloride 0.9 % 100 mL IVPB 1 g 200 mL/hr   07/07/17 0956 New  Bag/Given   ceFEPIme (MAXIPIME) 1 g in sodium chloride 0.9 % 100 mL IVPB 1 g 200 mL/hr      Subjective: -Starting to feel better, still having some symptoms of dysuria, also reports ongoing weakness and dyspnea on exertion  Objective: Vitals:   07/06/17 1922 07/06/17 2207 07/07/17 0033 07/07/17 0412  BP: (!) 152/60 (!) 158/65 (!) 159/55 126/78  Pulse: 60 64 73 71  Resp: 18  18 18   Temp: 98.1 F (36.7 C)  98.2 F (36.8 C) 98.4 F (36.9 C)  TempSrc: Oral  Oral Oral  SpO2: 99%  95% 100%  Weight:    83.3 kg (183 lb 10.3 oz)    Intake/Output Summary (Last 24 hours) at 07/07/2017 1258 Last data filed at 07/07/2017 8676 Gross per 24 hour  Intake 962 ml  Output 2000 ml  Net -1038 ml   Filed Weights   07/07/17 0412  Weight: 83.3 kg (183 lb 10.3 oz)    Examination:  General exam: Appears calm and comfortable, elderly, chronically ill-appearing male, no distress Respiratory system: Clear to auscultation. Respiratory effort normal. Cardiovascular system: S1 & S2 heard regular rate rhythm, positive JVD Gastrointestinal system: Abdomen is nondistended, soft and nontender.Normal bowel sounds heard. Central nervous system: Alert and oriented. No focal neurological deficits, mild cognitive deficits noted Extremities: 2+ edema near ankle Skin: No rashes Psychiatry: Judgement and insight appear normal. Mood & affect appropriate.     Data Reviewed:   CBC: Recent Labs  Lab 07/06/17 1514 07/07/17 0712  WBC 10.5 10.4  NEUTROABS 8.5*  --   HGB 9.5* 9.0*  HCT 31.9* 30.3*  MCV 93.0 94.1  PLT 254 229  Basic Metabolic Panel: Recent Labs  Lab 07/06/17 1514 07/07/17 0712  NA 143 142  K 3.8 3.9  CL 105 102  CO2 28 27  GLUCOSE 143* 109*  BUN 47* 51*  CREATININE 1.18 1.25*  CALCIUM 10.1 9.8   GFR: Estimated Creatinine Clearance: 60.2 mL/min (A) (by C-G formula based on SCr of 1.25 mg/dL (H)). Liver Function Tests: Recent Labs  Lab 07/06/17 1514  AST 22  ALT 17    ALKPHOS 106  BILITOT 0.4  PROT 6.0*  ALBUMIN 3.1*   No results for input(s): LIPASE, AMYLASE in the last 168 hours. No results for input(s): AMMONIA in the last 168 hours. Coagulation Profile: No results for input(s): INR, PROTIME in the last 168 hours. Cardiac Enzymes: No results for input(s): CKTOTAL, CKMB, CKMBINDEX, TROPONINI in the last 168 hours. BNP (last 3 results) No results for input(s): PROBNP in the last 8760 hours. HbA1C: No results for input(s): HGBA1C in the last 72 hours. CBG: Recent Labs  Lab 07/06/17 2122 07/07/17 0747  GLUCAP 97 108*   Lipid Profile: No results for input(s): CHOL, HDL, LDLCALC, TRIG, CHOLHDL, LDLDIRECT in the last 72 hours. Thyroid Function Tests: No results for input(s): TSH, T4TOTAL, FREET4, T3FREE, THYROIDAB in the last 72 hours. Anemia Panel: No results for input(s): VITAMINB12, FOLATE, FERRITIN, TIBC, IRON, RETICCTPCT in the last 72 hours. Urine analysis:    Component Value Date/Time   COLORURINE YELLOW 07/06/2017 0701   APPEARANCEUR HAZY (A) 07/06/2017 0701   LABSPEC 1.010 07/06/2017 0701   PHURINE 6.0 07/06/2017 0701   GLUCOSEU NEGATIVE 07/06/2017 0701   GLUCOSEU NEGATIVE 05/02/2015 1008   HGBUR SMALL (A) 07/06/2017 0701   BILIRUBINUR NEGATIVE 07/06/2017 0701   KETONESUR NEGATIVE 07/06/2017 0701   PROTEINUR 100 (A) 07/06/2017 0701   UROBILINOGEN 0.2 05/02/2015 1008   NITRITE POSITIVE (A) 07/06/2017 0701   LEUKOCYTESUR LARGE (A) 07/06/2017 0701   Sepsis Labs: @LABRCNTIP (procalcitonin:4,lacticidven:4)  ) Recent Results (from the past 240 hour(s))  Urine C&S     Status: Abnormal   Collection Time: 07/06/17  7:02 AM  Result Value Ref Range Status   Specimen Description URINE, CLEAN CATCH  Final   Special Requests   Final    NONE Performed at [facility], [place] [address], [place], [place] [address]    Culture MULTIPLE SPECIES PRESENT, SUGGEST RECOLLECTION (A)  Final   Report Status 07/07/2017 FINAL  Final          Radiology Studies: Dg Chest 2 View  Result Date: 07/06/2017 CLINICAL DATA:  Chest pain EXAM: CHEST  2 VIEW COMPARISON:  06/12/2017 FINDINGS: Chronic cardiomegaly. Stable mediastinal contours. There is mild generalized interstitial coarsening that is chronic. Asymmetric lucency at the right base with air trapping seen on 2018 abdominal CT. Generalized bony sclerosis in this patient with myelofibrosis. No acute osseous finding. IMPRESSION: 1. No acute finding when compared to priors. 2. Chronic cardiomegaly. Electronically Signed   By: Monte Fantasia M.D.   On: 07/06/2017 14:04        Scheduled Meds: . aspirin EC  81 mg Oral Daily  . enoxaparin (LOVENOX) injection  40 mg Subcutaneous Q24H  . ferrous gluconate  324 mg Oral Q breakfast  . furosemide  60 mg Intravenous Q12H  . insulin aspart  0-9 Units Subcutaneous TID WC  . potassium chloride  40 mEq Oral BID  . tamsulosin  0.4 mg Oral QPC supper   Continuous Infusions: . ceFEPime (MAXIPIME) IV Stopped (07/07/17 1026)  LOS: 1 day    Time spent: 23min    Domenic Polite, MD [facility] Page via [email or web address], password [email or web address] After 7PM please contact night-coverage  07/07/2017, 12:58 PM

## 2017-07-07 NOTE — Progress Notes (Signed)
Pt states he takes Aspirin 325 mg PO daily. Pt also takes B vitamin tablet daily.

## 2017-07-07 NOTE — Consult Note (Signed)
   [facility] CM Inpatient Consult   07/07/2017  Juan [name] Jun 30, 1940 [id number]  Patient previously active and staff was unable to maintain contact.   Met with the patient to confirm phone contact and regarding the benefits of [facility] services for monitoring in the home.Patient states, I am not going to answer the phone when I am not feeling good and when my bladder and bowels are giving me a fit.  There's no need for the nurse or anybody coming out there when I am in the toilet.  All they want is for me to get up and if I don't feel like it, I am not do it. So no, I am not needing anybody."   Review information for [facility] Care Management in assisting him with meeting goals.  He states, "when I am sick I am coming to the hospital cause my bladder is giving me a fit."   Explained that [facility] does not interfere with or replace any services arranged by the inpatient care management staff.  Patient declined services with [facility].  [name], RN BSN [facility] Liaison  [phone] business mobile phone Toll free office [phone]

## 2017-07-07 NOTE — Progress Notes (Signed)
PT Cancellation Note  Patient Details Name: Juan [name] MRN: [id number] DOB: [birth year]   Cancelled Treatment:    Reason Eval/Treat Not Completed: Patient declined, no reason specified. Pt stating not today and come back tomorrow. Pt known to me from prior visits. In the past pt has been resistant to verbal encouragement of therapy staff. Will continue attempts.   Shary Decamp [place] 07/07/2017, 11:39 AM Suanne Marker PT [phone]

## 2017-07-07 NOTE — Progress Notes (Signed)
Juan [name] is known to me through prior hospitalizations, [facility] Clinic visits and previous participation in the [facility].  He has in the past refused to continue home visits--however during recent clinic visit again agreed to Paramedic visits.  He has been referred and all appropriate paperwork has been sent via secure email.

## 2017-07-07 NOTE — Progress Notes (Signed)
Pt refuses to sit in bedside chair. Pt states he wants to stay in bed.

## 2017-07-07 NOTE — Progress Notes (Signed)
Pt refused TED hose last night. Pt states that a doctor d/c'ed them a year ago because they hurt the patient's heart. RN attempted re-education. Will continue to monitor.

## 2017-07-08 LAB — GLUCOSE, CAPILLARY
Glucose-Capillary: 101 mg/dL — ABNORMAL HIGH (ref 65–99)
Glucose-Capillary: 94 mg/dL (ref 65–99)
Glucose-Capillary: 100 mg/dL — ABNORMAL HIGH (ref 65–99)
Glucose-Capillary: 108 mg/dL — ABNORMAL HIGH (ref 65–99)

## 2017-07-08 LAB — BASIC METABOLIC PANEL
Anion gap: 11 (ref 5–15)
BUN: 52 mg/dL — ABNORMAL HIGH (ref 6–20)
CALCIUM: 9.7 mg/dL (ref 8.9–10.3)
CO2: 27 mmol/L (ref 22–32)
CREATININE: 1.28 mg/dL — AB (ref 0.61–1.24)
Chloride: 102 mmol/L (ref 101–111)
GFR calc Af Amer: >60 mL/min (ref >60)
GFR calc non Af Amer: 53 mL/min — ABNORMAL LOW (ref >60)
GLUCOSE: 99 mg/dL (ref 65–99)
Potassium: 5 mmol/L (ref 3.5–5.1)
Sodium: 140 mmol/L (ref 135–145)

## 2017-07-08 MED ORDER — FUROSEMIDE 10 MG/ML IJ SOLN
20.0000 mg | Freq: Two times a day (BID) | INTRAMUSCULAR | Status: DC
  Administered 2017-07-08: 20 mg via INTRAVENOUS
  Filled 2017-07-08 (×3): qty 2

## 2017-07-08 MED ORDER — VITAMIN B-12 100 MCG PO TABS
100.0000 ug | ORAL_TABLET | Freq: Every day | ORAL | Status: DC
  Administered 2017-07-08 – 2017-07-10 (×3): 100 ug via ORAL
  Filled 2017-07-08 (×3): qty 1

## 2017-07-08 MED ORDER — FERROUS GLUCONATE 324 (38 FE) MG PO TABS
324.0000 mg | ORAL_TABLET | Freq: Every day | ORAL | Status: DC
  Administered 2017-07-08 – 2017-07-10 (×3): 324 mg via ORAL
  Filled 2017-07-08 (×2): qty 1

## 2017-07-08 MED ORDER — POTASSIUM CHLORIDE CRYS ER 20 MEQ PO TBCR
40.0000 meq | EXTENDED_RELEASE_TABLET | Freq: Every day | ORAL | Status: DC
  Filled 2017-07-08: qty 2

## 2017-07-08 MED ORDER — ASPIRIN EC 325 MG PO TBEC
325.0000 mg | DELAYED_RELEASE_TABLET | Freq: Every day | ORAL | Status: DC
  Administered 2017-07-08 – 2017-07-10 (×3): 325 mg via ORAL
  Filled 2017-07-08 (×3): qty 1

## 2017-07-08 NOTE — Progress Notes (Signed)
PT Cancellation Note  Patient Details Name: Juan [name] MRN: [id number] DOB: Apr 03, 1941   Cancelled Treatment:    Reason Eval/Treat Not Completed: Other (comment). Pt stating, "I told y'all I'm not ready to walk until this afternoon." Education on reason for PT evaluation, but pt still requesting we hold off. Will follow-up for evaluation as schedule permits.  Mabeline Caras, PT, DPT Acute Rehab Services  Pager: [place] 07/08/2017, 9:06 AM

## 2017-07-08 NOTE — Evaluation (Signed)
Physical Therapy Evaluation Patient Details Name: Juan [name] MRN: [id number] DOB: 09/09/1940 Today's Date: 07/08/2017   History of Present Illness  Pt is a 77 y.o. male admitted 07/06/17 with worsening SOB, weakness and dysuria. Worked up for CHF exacerbation with known severe pulmonary hypertension; pt also with UTI. PMH includes DMII, CKD III, OSA, CAD. Of note, admitted 3 weeks ago for sigmoidoscopy and clip placement.    Clinical Impression  Pt presents with an overall decrease in functional mobility secondary to above. PTA, mod indep to amb with RW/SPC; lives with son who assists with ADLs as needed. Today, pt required max encouragement to participate with PT. Only willing to amb short distance with RW; noted to have bowel incontinence and dependent for pericare. Pt refusing SNF-level therapies. Would benefit from [place], but likely refusing those as well; reports son can assist with all his needs. Will follow acutely to maximize functional mobility prior to return home.    Follow Up Recommendations Home health PT;Supervision/Assistance - 24 hour(pt refusing SNF; most likely declining HH)    Equipment Recommendations  None recommended by PT    Recommendations for Other Services       Precautions / Restrictions Precautions Precautions: Fall Restrictions Weight Bearing Restrictions: No      Mobility  Bed Mobility Overal bed mobility: Needs Assistance Bed Mobility: Supine to Sit;Sit to Supine     Supine to sit: Mod assist;HOB elevated Sit to supine: Mod assist;HOB elevated   General bed mobility comments: ModA to assist BLEs out of/into bed. Pt unwilling to attempt himself despite educ on importance of independence  Transfers Overall transfer level: Needs assistance Equipment used: Rolling walker (2 wheeled) Transfers: Sit to/from Stand Sit to Stand: Min guard;From elevated surface         General transfer comment: Pt requesting very elevated bed before standing  despite encouragement to attempt from lower surface. Able to do so x3 with RW and min guard for balance  Ambulation/Gait Ambulation/Gait assistance: Supervision Ambulation Distance (Feet): 5 Feet Assistive device: Rolling walker (2 wheeled) Gait Pattern/deviations: Step-to pattern;Trunk flexed Gait velocity: Decreased Gait velocity interpretation: <1.8 ft/sec, indicative of risk for recurrent falls General Gait Details: Slow, controlled ambulation forwards/backwards with RW and supervision; pt unwilling to attempt further distance  Stairs            Wheelchair Mobility    Modified Rankin (Stroke Patients Only)       Balance Overall balance assessment: Needs assistance   Sitting balance-Leahy Scale: Fair       Standing balance-Leahy Scale: Poor Standing balance comment: Reliant on UE support; dependent for pericare while standing                             Pertinent Vitals/Pain Pain Assessment: Faces Faces Pain Scale: Hurts a little bit Pain Location: L wrist IV site Pain Intervention(s): Monitored during session    Home Living Family/patient expects to be discharged to:: Private residence Living Arrangements: Children Available Help at Discharge: Family;Available 24 hours/day Type of Home: Apartment Home Access: Level entry     Home Layout: One level Home Equipment: Walker - 2 wheels;Cane - single point;Shower seat;Bedside commode;Wheelchair - manual;Hospital bed      Prior Function Level of Independence: Needs assistance   Gait / Transfers Assistance Needed: Limited to short amb distances with RW or SPC. Intermittent use of w/c  ADL's / Homemaking Assistance Needed: Takes bird baths at sink. Son  assists with ADLs as needed. Family drives him        Hand Dominance        Extremity/Trunk Assessment   Upper Extremity Assessment Upper Extremity Assessment: Generalized weakness    Lower Extremity Assessment Lower Extremity Assessment:  Generalized weakness       Communication   Communication: No difficulties  Cognition Arousal/Alertness: Awake/alert Behavior During Therapy: Agitated Overall Cognitive Status: Within Functional Limits for tasks assessed                                 General Comments: Overall cognitive status appears WFL. Pt generally agitated with PT wanting to help him mobilize (known h/o this with previous admissions)      General Comments      Exercises     Assessment/Plan    PT Assessment Patient needs continued PT services  PT Problem List Decreased strength;Decreased activity tolerance;Decreased balance;Decreased mobility;Decreased knowledge of use of DME;Decreased skin integrity       PT Treatment Interventions DME instruction;Gait training;Stair training;Functional mobility training;Therapeutic activities;Therapeutic exercise;Balance training;Patient/family education;Wheelchair mobility training    PT Goals (Current goals can be found in the Care Plan section)  Acute Rehab PT Goals Patient Stated Goal: Return home and be left alone PT Goal Formulation: With patient Time For Goal Achievement: 07/22/17 Potential to Achieve Goals: Good    Frequency Min 3X/week   Barriers to discharge        Co-evaluation               AM-PAC PT "6 Clicks" Daily Activity  Outcome Measure Difficulty turning over in bed (including adjusting bedclothes, sheets and blankets)?: Unable Difficulty moving from lying on back to sitting on the side of the bed? : Unable Difficulty sitting down on and standing up from a chair with arms (e.g., wheelchair, bedside commode, etc,.)?: A Little Help needed moving to and from a bed to chair (including a wheelchair)?: A Little Help needed walking in hospital room?: A Little Help needed climbing 3-5 steps with a railing? : A Little 6 Click Score: 14    End of Session Equipment Utilized During Treatment: Gait belt Activity Tolerance: Other  (comment)(Limited by unwillingness to mobilize) Patient left: in bed;with bed alarm set;with call [place]/phone within reach Nurse Communication: Mobility status PT Visit Diagnosis: Other abnormalities of gait and mobility (R26.89);Muscle weakness (generalized) (M62.81)    Time: 5449-2010 PT Time Calculation (min) (ACUTE ONLY): 20 min   Charges:   PT Evaluation $PT Eval Moderate Complexity: 1 Mod     PT G Codes:       Mabeline Caras, PT, DPT Acute Rehab Services  Pager: [place] 07/08/2017, 2:05 PM

## 2017-07-08 NOTE — Care Management Note (Addendum)
Case Management Note  Patient Details  Name: Juan [name] MRN: [id number] Date of Birth: [birth year]  Subjective/Objective:    CHF               Action/Plan: Patient lives at home with son; PCP: Colon Branch, MD; has private insurance with Medicare; [facility] choice [facility]; DME - walker, cane, wheelchair, hospital bed; patient could benefit greatly from a [place] service at discharge but patient is refusing at this time; patient stated " I have a lot of diarrhea and I don't have the time to stop what I am doing and let them in the house."Noted that patient was recently in a SNF at [facility] and now per SW he is in his co pay days; CM will continue to follow for progression of care.     Expected Discharge Date:    possibly 07/12/2017              Expected Discharge Plan:  [place]  Discharge planning Services  CM Consult Choice offered to:  Patient  HH Arranged:  Patient Refused HH  Status of Service:  In process, will continue to follow  Sherrilyn Rist [phone] 07/08/2017, 11:11 AM

## 2017-07-08 NOTE — Progress Notes (Signed)
PROGRESS NOTE    Juan [name]  ZHG:[id number] DOB: [birth year] DOA: 07/06/2017 PCP: Colon Branch, MD  Brief Narrative:Juan [name] is a 77 y.o. male with medical history significant of diastolic congestive heart failure, severe pulmonary hypertension -on supplemental oxygen at home, iron deficiency anemia, hypertension, coronary artery disease, urinary retention, obstructive sleep apnea who presented to the emergency department complaints of increasing shortness of breath, weakness, worsening of lower extremity swelling, abdominal pain in the suprapubic region and dysuria In ED, found to have 3+ edema and Abnormal UA  Assessment & Plan:   Acute extubation of chronic diastolic congestive heart failure in patient with known severe pulmonary hypertension -ECHO with EF 55-60% from 09/2016 -more like right sided heart failure, given 3+ edema and clear lungs -improving with diuresis, continue IV lasix another day -negative 2L -Stop ACE inhibitor to allow more room for diuresis  UTI -  abnormal urinalysis with symptoms of UTI  -Currently on cefepime, will continue,  -Continues to have symptoms of dysuria, Urine culture with multiple species, stop Abx after 3days, after tomorrow's dose   Diabetes mellitus type 2 -currently diet controlled -Continue sliding scale insulin,  CKD stage III - -creatinine stable at baseline, stopped ACE inhibitor  -Monitor with diuresis   Obstructive sleep apnea -Continue CPAP daily at bedtime  Myelofibrosis -Follow-up with oncology Dr.Ennever  DVT prophylaxis: Lovenox Code Status: Full Family Communication: None Disposition Plan: Home once clinically improved, 1-2days hopefully    Consultants:      Procedures:   Antimicrobials:  Antibiotics Given (last 72 hours)    Date/Time Action Medication Dose Rate   07/06/17 1533 New Bag/Given   cefTRIAXone (ROCEPHIN) 1 g in sodium chloride 0.9 % 100 mL IVPB 1 g 200 mL/hr   07/06/17 2332 New Bag/Given   ceFEPIme (MAXIPIME) 1 g in sodium chloride 0.9 % 100 mL IVPB 1 g 200 mL/hr   07/07/17 0956 New Bag/Given   ceFEPIme (MAXIPIME) 1 g in sodium chloride 0.9 % 100 mL IVPB 1 g 200 mL/hr   07/07/17 2200 New Bag/Given   ceFEPIme (MAXIPIME) 1 g in sodium chloride 0.9 % 100 mL IVPB 1 g 200 mL/hr   07/08/17 1031 New Bag/Given   ceFEPIme (MAXIPIME) 1 g in sodium chloride 0.9 % 100 mL IVPB 1 g 200 mL/hr      Subjective: -Still reports some dyspnea, overall feels like he is improving, continues to have intermittent dysuria as well  Objective: Vitals:   07/07/17 1810 07/07/17 2011 07/08/17 0515 07/08/17 0905  BP:  (!) 125/54 129/60 (!) 139/55  Pulse:  83 79 80  Resp:  18 18 16   Temp:  98.6 F (37 C) 98.4 F (36.9 C) (!) 97.5 F (36.4 C)  TempSrc:  Oral Oral Oral  SpO2:  98% 95% 100%  Weight:   80.6 kg (177 lb 11.2 oz)   Height: 6\' 3"  (1.905 m)       Intake/Output Summary (Last 24 hours) at 07/08/2017 1319 Last data filed at 07/08/2017 0954 Gross per 24 hour  Intake 1160 ml  Output 1551 ml  Net -391 ml   Filed Weights   07/07/17 0412 07/08/17 0515  Weight: 83.3 kg (183 lb 10.3 oz) 80.6 kg (177 lb 11.2 oz)    Examination:  Gen: Awake, Alert, Oriented X 2, no distress HEENT: Pupils equal reactive, positive JVD Lungs: Diminished breath sounds at both bases, rest clear CVS: S1-S2/regular rate rhythm Abd: soft, Non tender, non distended, BS present Extremities: 1-2+ edema near  both ankles  Skin: Hyperpigmented skin of both lower extremities Psychiatry: Very poor insight,. Mood & affect appropriate.     Data Reviewed:   CBC: Recent Labs  Lab 07/06/17 1514 07/07/17 0712  WBC 10.5 10.4  NEUTROABS 8.5*  --   HGB 9.5* 9.0*  HCT 31.9* 30.3*  MCV 93.0 94.1  PLT 254 681   Basic Metabolic Panel: Recent Labs  Lab 07/06/17 1514 07/07/17 0712 07/08/17 0458  NA 143 142 140  K 3.8 3.9 5.0  CL 105 102 102  CO2 28 27 27   GLUCOSE 143* 109* 99  BUN 47* 51* 52*  CREATININE  1.18 1.25* 1.28*  CALCIUM 10.1 9.8 9.7   GFR: Estimated Creatinine Clearance: 56.8 mL/min (A) (by C-G formula based on SCr of 1.28 mg/dL (H)). Liver Function Tests: Recent Labs  Lab 07/06/17 1514  AST 22  ALT 17  ALKPHOS 106  BILITOT 0.4  PROT 6.0*  ALBUMIN 3.1*   No results for input(s): LIPASE, AMYLASE in the last 168 hours. No results for input(s): AMMONIA in the last 168 hours. Coagulation Profile: No results for input(s): INR, PROTIME in the last 168 hours. Cardiac Enzymes: No results for input(s): CKTOTAL, CKMB, CKMBINDEX, TROPONINI in the last 168 hours. BNP (last 3 results) No results for input(s): PROBNP in the last 8760 hours. HbA1C: No results for input(s): HGBA1C in the last 72 hours. CBG: Recent Labs  Lab 07/07/17 0747 07/07/17 1721 07/07/17 2202 07/08/17 0750 07/08/17 1139  GLUCAP 108* 102* 117* 101* 94   Lipid Profile: No results for input(s): CHOL, HDL, LDLCALC, TRIG, CHOLHDL, LDLDIRECT in the last 72 hours. Thyroid Function Tests: No results for input(s): TSH, T4TOTAL, FREET4, T3FREE, THYROIDAB in the last 72 hours. Anemia Panel: No results for input(s): VITAMINB12, FOLATE, FERRITIN, TIBC, IRON, RETICCTPCT in the last 72 hours. Urine analysis:    Component Value Date/Time   COLORURINE YELLOW 07/06/2017 0701   APPEARANCEUR HAZY (A) 07/06/2017 0701   LABSPEC 1.010 07/06/2017 0701   PHURINE 6.0 07/06/2017 0701   GLUCOSEU NEGATIVE 07/06/2017 0701   GLUCOSEU NEGATIVE 05/02/2015 1008   HGBUR SMALL (A) 07/06/2017 0701   BILIRUBINUR NEGATIVE 07/06/2017 0701   KETONESUR NEGATIVE 07/06/2017 0701   PROTEINUR 100 (A) 07/06/2017 0701   UROBILINOGEN 0.2 05/02/2015 1008   NITRITE POSITIVE (A) 07/06/2017 0701   LEUKOCYTESUR LARGE (A) 07/06/2017 0701   Sepsis Labs: @LABRCNTIP (procalcitonin:4,lacticidven:4)  ) Recent Results (from the past 240 hour(s))  Urine C&S     Status: Abnormal   Collection Time: 07/06/17  7:02 AM  Result Value Ref Range Status     Specimen Description URINE, CLEAN CATCH  Final   Special Requests   Final    NONE Performed at [facility], [place] [address] [place], [place] [address]    Culture MULTIPLE SPECIES PRESENT, SUGGEST RECOLLECTION (A)  Final   Report Status 07/07/2017 FINAL  Final         Radiology Studies: Dg Chest 2 View  Result Date: 07/06/2017 CLINICAL DATA:  Chest pain EXAM: CHEST  2 VIEW COMPARISON:  06/12/2017 FINDINGS: Chronic cardiomegaly. Stable mediastinal contours. There is mild generalized interstitial coarsening that is chronic. Asymmetric lucency at the right base with air trapping seen on 2018 abdominal CT. Generalized bony sclerosis in this patient with myelofibrosis. No acute osseous finding. IMPRESSION: 1. No acute finding when compared to priors. 2. Chronic cardiomegaly. Electronically Signed   By: Monte Fantasia M.D.   On: 07/06/2017 14:04  Scheduled Meds: . aspirin EC  325 mg Oral Daily  . enoxaparin (LOVENOX) injection  40 mg Subcutaneous Q24H  . ferrous gluconate  324 mg Oral Q breakfast  . furosemide  20 mg Intravenous Q12H  . insulin aspart  0-9 Units Subcutaneous TID WC  . [START ON 07/09/2017] potassium chloride  40 mEq Oral Daily  . tamsulosin  0.4 mg Oral QPC supper  . vitamin B-12  100 mcg Oral Daily   Continuous Infusions: . ceFEPime (MAXIPIME) IV 1 g (07/08/17 1031)     LOS: 2 days    Time spent: 57min    Domenic Polite, MD [facility] Page via [email or web address], password [email or web address] After 7PM please contact night-coverage  07/08/2017, 1:19 PM

## 2017-07-09 LAB — GLUCOSE, CAPILLARY
GLUCOSE-CAPILLARY: 117 mg/dL — AB (ref 65–99)
Glucose-Capillary: 85 mg/dL (ref 65–99)
Glucose-Capillary: 117 mg/dL — ABNORMAL HIGH (ref 65–99)
Glucose-Capillary: 100 mg/dL — ABNORMAL HIGH (ref 65–99)

## 2017-07-09 LAB — BASIC METABOLIC PANEL
Anion gap: 11 (ref 5–15)
BUN: 53 mg/dL — ABNORMAL HIGH (ref 6–20)
CALCIUM: 9.7 mg/dL (ref 8.9–10.3)
CO2: 27 mmol/L (ref 22–32)
CREATININE: 1.23 mg/dL (ref 0.61–1.24)
Chloride: 104 mmol/L (ref 101–111)
GFR calc Af Amer: >60 mL/min (ref >60)
GFR calc non Af Amer: 56 mL/min — ABNORMAL LOW (ref >60)
GLUCOSE: 95 mg/dL (ref 65–99)
Potassium: 5.1 mmol/L (ref 3.5–5.1)
Sodium: 142 mmol/L (ref 135–145)

## 2017-07-09 MED ORDER — FUROSEMIDE 40 MG PO TABS
40.0000 mg | ORAL_TABLET | Freq: Two times a day (BID) | ORAL | Status: DC
  Administered 2017-07-09 – 2017-07-10 (×3): 40 mg via ORAL
  Filled 2017-07-09 (×3): qty 1

## 2017-07-09 NOTE — Progress Notes (Signed)
Pt refuses CPAP. States he doesn't use one and doesn't want one. RT will continue to monitor.

## 2017-07-09 NOTE — Plan of Care (Signed)
  Clinical Measurements: Ability to maintain clinical measurements within normal limits will improve 07/09/2017 2309 - Progressing by Apphia Cropley, Roma Kayser, RN   Clinical Measurements: Cardiovascular complication will be avoided 07/09/2017 2309 - Progressing by Sumit Branham, Roma Kayser, RN   Pain Managment: General experience of comfort will improve 07/09/2017 2309 - Progressing by Alayjah Boehringer, Roma Kayser, RN

## 2017-07-09 NOTE — Progress Notes (Signed)
Pt refuses CPAP. States he doesn't use one and doesn't want one.

## 2017-07-09 NOTE — Progress Notes (Signed)
PROGRESS NOTE    [name]  HKV:[id number] DOB: 18-Apr-1941 DOA: 07/06/2017 PCP: Colon Branch, MD  Brief Narrative:Juan [name] is a 77 y.o. male with medical history significant of diastolic congestive heart failure, severe pulmonary hypertension -on supplemental oxygen at home, iron deficiency anemia, hypertension, coronary artery disease, urinary retention, obstructive sleep apnea who presented to the emergency department complaints of increasing shortness of breath, weakness, worsening of lower extremity swelling, abdominal pain in the suprapubic region and dysuria In ED, found to have 3+ edema and Abnormal UA  Assessment & Plan:   Acute extubation of chronic diastolic congestive heart failure in patient with known severe pulmonary hypertension -ECHO with EF 55-60% from 09/2016 -more like right sided heart failure, given 3+ edema and clear lungs -improving with diuresis, change to PO lasix -negative 2L -Stopped ACE inhibitor to allow more room for diuresis  UTI -  abnormal urinalysis with ongoing symptoms of UTI  -Currently on cefepime, will continue another day despite culture due to severe dysuria,  -Urine culture with multiple species, previous cx with Pseudomonas in December -advised FU with urology  Diabetes mellitus type 2 -currently diet controlled -Continue sliding scale insulin,  CKD stage III - -creatinine stable at baseline, stopped ACE inhibitor  -Monitor with diuresis   Obstructive sleep apnea -Continue CPAP daily at bedtime  Myelofibrosis -Follow-up with oncology Dr.Ennever  Deconditioned/debility -declines SNF which he needs  DVT prophylaxis: Lovenox Code Status: Full Family Communication: None at bedside, unable to reach son or daughter via number in chart Disposition Plan: Home tomorrow, declines SNF which he needs    Consultants:      Procedures:   Antimicrobials:  Antibiotics Given (last 72 hours)    Date/Time Action Medication Dose Rate   07/06/17 1533 New Bag/Given   cefTRIAXone (ROCEPHIN) 1 g in sodium chloride 0.9 % 100 mL IVPB 1 g 200 mL/hr   07/06/17 2332 New Bag/Given   ceFEPIme (MAXIPIME) 1 g in sodium chloride 0.9 % 100 mL IVPB 1 g 200 mL/hr   07/07/17 0956 New Bag/Given   ceFEPIme (MAXIPIME) 1 g in sodium chloride 0.9 % 100 mL IVPB 1 g 200 mL/hr   07/07/17 2200 New Bag/Given   ceFEPIme (MAXIPIME) 1 g in sodium chloride 0.9 % 100 mL IVPB 1 g 200 mL/hr   07/08/17 1031 New Bag/Given   ceFEPIme (MAXIPIME) 1 g in sodium chloride 0.9 % 100 mL IVPB 1 g 200 mL/hr   07/08/17 2147 New Bag/Given   ceFEPIme (MAXIPIME) 1 g in sodium chloride 0.9 % 100 mL IVPB 1 g 200 mL/hr   07/09/17 1041 New Bag/Given   ceFEPIme (MAXIPIME) 1 g in sodium chloride 0.9 % 100 mL IVPB 1 g 200 mL/hr      Subjective: -still reports dysuria  Objective: Vitals:   07/08/17 2001 07/09/17 0608 07/09/17 0800 07/09/17 1100  BP: (!) 156/62 133/73 140/61 (!) 149/56  Pulse: 67 67 79 75  Resp: 18 18 19 18   Temp: 98.3 F (36.8 C) (!) 97.5 F (36.4 C) 97.9 F (36.6 C) 98.1 F (36.7 C)  TempSrc: Oral Oral Oral Oral  SpO2: 100% 100% 100% 100%  Weight:  75.5 kg (166 lb 8 oz)    Height:        Intake/Output Summary (Last 24 hours) at 07/09/2017 1422 Last data filed at 07/09/2017 1100 Gross per 24 hour  Intake 720 ml  Output 1520 ml  Net -800 ml   Filed Weights   07/07/17 0412 07/08/17 0515  07/09/17 0608  Weight: 83.3 kg (183 lb 10.3 oz) 80.6 kg (177 lb 11.2 oz) 75.5 kg (166 lb 8 oz)    Examination:  Gen: Awake, Alert, Oriented X 2, no distress HEENT: PERRLA, no JVD Lungs: Good air movement bilaterally, CTAB CVS: RRR,No Gallops,Rubs or new Murmurs Abd: soft, Non tender, non distended, BS present Extremities: ankle edema improving Skin: Hyperpigmented skin of both lower extremities Psychiatry: Very poor insight,. Mood & affect appropriate.     Data Reviewed:   CBC: Recent Labs  Lab 07/06/17 1514 07/07/17 0712  WBC 10.5 10.4    NEUTROABS 8.5*  --   HGB 9.5* 9.0*  HCT 31.9* 30.3*  MCV 93.0 94.1  PLT 254 062   Basic Metabolic Panel: Recent Labs  Lab 07/06/17 1514 07/07/17 0712 07/08/17 0458 07/09/17 0415  NA 143 142 140 142  K 3.8 3.9 5.0 5.1  CL 105 102 102 104  CO2 28 27 27 27   GLUCOSE 143* 109* 99 95  BUN 47* 51* 52* 53*  CREATININE 1.18 1.25* 1.28* 1.23  CALCIUM 10.1 9.8 9.7 9.7   GFR: Estimated Creatinine Clearance: 55.4 mL/min (by C-G formula based on SCr of 1.23 mg/dL). Liver Function Tests: Recent Labs  Lab 07/06/17 1514  AST 22  ALT 17  ALKPHOS 106  BILITOT 0.4  PROT 6.0*  ALBUMIN 3.1*   No results for input(s): LIPASE, AMYLASE in the last 168 hours. No results for input(s): AMMONIA in the last 168 hours. Coagulation Profile: No results for input(s): INR, PROTIME in the last 168 hours. Cardiac Enzymes: No results for input(s): CKTOTAL, CKMB, CKMBINDEX, TROPONINI in the last 168 hours. BNP (last 3 results) No results for input(s): PROBNP in the last 8760 hours. HbA1C: No results for input(s): HGBA1C in the last 72 hours. CBG: Recent Labs  Lab 07/08/17 1139 07/08/17 1658 07/08/17 2140 07/09/17 0723 07/09/17 1145  GLUCAP 94 100* 108* 85 117*   Lipid Profile: No results for input(s): CHOL, HDL, LDLCALC, TRIG, CHOLHDL, LDLDIRECT in the last 72 hours. Thyroid Function Tests: No results for input(s): TSH, T4TOTAL, FREET4, T3FREE, THYROIDAB in the last 72 hours. Anemia Panel: No results for input(s): VITAMINB12, FOLATE, FERRITIN, TIBC, IRON, RETICCTPCT in the last 72 hours. Urine analysis:    Component Value Date/Time   COLORURINE YELLOW 07/06/2017 0701   APPEARANCEUR HAZY (A) 07/06/2017 0701   LABSPEC 1.010 07/06/2017 0701   PHURINE 6.0 07/06/2017 0701   GLUCOSEU NEGATIVE 07/06/2017 0701   GLUCOSEU NEGATIVE 05/02/2015 1008   HGBUR SMALL (A) 07/06/2017 0701   BILIRUBINUR NEGATIVE 07/06/2017 0701   KETONESUR NEGATIVE 07/06/2017 0701   PROTEINUR 100 (A) 07/06/2017 0701    UROBILINOGEN 0.2 05/02/2015 1008   NITRITE POSITIVE (A) 07/06/2017 0701   LEUKOCYTESUR LARGE (A) 07/06/2017 0701   Sepsis Labs: @[email or web address] (procalcitonin:4,lacticidven:4)  ) Recent Results (from the past 240 hour(s))  Urine C&S     Status: Abnormal   Collection Time: 07/06/17  7:02 AM  Result Value Ref Range Status   Specimen Description URINE, CLEAN CATCH  Final   Special Requests   Final    NONE Performed at [facility], [place] [address], [place], [place] [address]    Culture MULTIPLE SPECIES PRESENT, SUGGEST RECOLLECTION (A)  Final   Report Status 07/07/2017 FINAL  Final         Radiology Studies: No results found.      Scheduled Meds: . aspirin EC  325 mg Oral Daily  . enoxaparin (LOVENOX) injection  40  mg Subcutaneous Q24H  . ferrous gluconate  324 mg Oral Q breakfast  . furosemide  40 mg Oral BID  . insulin aspart  0-9 Units Subcutaneous TID WC  . tamsulosin  0.4 mg Oral QPC supper  . vitamin B-12  100 mcg Oral Daily   Continuous Infusions: . ceFEPime (MAXIPIME) IV Stopped (07/09/17 1232)     LOS: 3 days    Time spent: 3min    Domenic Polite, MD [facility] Page via [email or web address], password [email or web address] After 7PM please contact night-coverage  07/09/2017, 2:22 PM

## 2017-07-10 DIAGNOSIS — N3000 Acute cystitis without hematuria: Secondary | ICD-10-CM

## 2017-07-10 DIAGNOSIS — Z794 Long term (current) use of insulin: Secondary | ICD-10-CM

## 2017-07-10 DIAGNOSIS — E1142 Type 2 diabetes mellitus with diabetic polyneuropathy: Secondary | ICD-10-CM

## 2017-07-10 LAB — GLUCOSE, CAPILLARY
GLUCOSE-CAPILLARY: 104 mg/dL — AB (ref 65–99)
Glucose-Capillary: 89 mg/dL (ref 65–99)

## 2017-07-10 LAB — BASIC METABOLIC PANEL
Anion gap: 7 (ref 5–15)
BUN: 52 mg/dL — AB (ref 6–20)
CHLORIDE: 104 mmol/L (ref 101–111)
CO2: 28 mmol/L (ref 22–32)
CREATININE: 1.18 mg/dL (ref 0.61–1.24)
Calcium: 9.4 mg/dL (ref 8.9–10.3)
GFR calc Af Amer: >60 mL/min (ref >60)
GFR calc non Af Amer: 59 mL/min — ABNORMAL LOW (ref >60)
Glucose, Bld: 91 mg/dL (ref 65–99)
Potassium: 4.6 mmol/L (ref 3.5–5.1)
Sodium: 139 mmol/L (ref 135–145)

## 2017-07-10 MED ORDER — CEPHALEXIN 250 MG PO CAPS: 250.0000 mg | ORAL_CAPSULE | Freq: Three times a day (TID) | ORAL | 0 refills | Status: DC

## 2017-07-10 MED ORDER — FUROSEMIDE 40 MG PO TABS: ORAL_TABLET | ORAL | Status: DC

## 2017-07-10 NOTE — Discharge Summary (Signed)
Physician Discharge Summary  Young Mulvey BSW:[id number] DOB: 12/24/1940 DOA: 07/06/2017  PCP: Colon Branch, MD  Admit date: 07/06/2017 Discharge date: 07/10/2017  Time spent: 45 minutes  Recommendations for Outpatient Follow-up:  1. PCP in 1 week 2. Pt adamantly declines SNF and Home Physical therapy despite really needing this 3. FU with Urologist at [facility] in 2weeks 4. FU with Oncology Dr.Ennever in 2-3weeks   Discharge Diagnoses:  Principal Problem:   Acute exacerbation of CHF (congestive heart failure) ([place]) Active Problems:   Obstructive sleep apnea   Chronic diastolic CHF (congestive heart failure) (HCC)   CKD (chronic kidney disease) stage 3, GFR 30-59 ml/min (HCC)   Type II diabetes mellitus ([place])   Pulmonary hypertension severe   UTI (urinary tract infection)   Discharge Condition: stable  Diet recommendation: Diabetic  Filed Weights   07/08/17 0515 07/09/17 0608 07/10/17 0507  Weight: 80.6 kg (177 lb 11.2 oz) 75.5 kg (166 lb 8 oz) 80 kg (176 lb 5.9 oz)    History of present illness:  Joesph Marcy a 77 y.o.malewith medical history significant ofdiastolic congestive heart failure, severe pulmonary hypertension-on supplemental oxygen at home, iron deficiency anemia, hypertension, coronary artery disease, urinary retention, obstructive sleep apnea who presented to the emergency department complaints of increasing shortness of breath, weakness, worsening of lower extremity swelling, abdominal pain in the suprapubic region and dysuria In ED, found to have 3+ edema and Abnormal UA     Hospital Course:   Acute extubation of chronic diastolic congestive heart failure in patient with known severe pulmonary hypertension -ECHO with EF 55-60% from 09/2016 -more like right sided heart failure, given 3+ edema and clear lungs -improved with diuresis, negative % litres, changed back to PO lasix  UTI- abnormal urinalysis with ongoing symptoms of UTI  -Urine culture  with multiple species now, previous cx with Pseudomonas in December, completed 5days of Cefepime this admission, due to some residual dyuria discharged home on 2days of Keflex day -advised FU with urology  Diabetes mellitus type 2-currently diet controlled -not on meds at home, stable  CKD stage III - -creatinine stable at baseline, stopped ACE inhibitor   Obstructive sleep apnea -Continue CPAP daily at bedtime  Myelofibrosis -Follow-up with oncology Dr.Ennever  Deconditioned/debility  -very unsteady but adamantly declines SNF and Home health PT, unable to have a rational discussion with pt about this despite multiple attempts, maybe his PCP will have better luck  Discharge Exam: Vitals:   07/10/17 0507 07/10/17 0904  BP: (!) 136/55 134/62  Pulse: 66 62  Resp: 18 16  Temp: 98 F (36.7 C) 97.9 F (36.6 C)  SpO2: 94% 97%    General: AAOx2 Cardiovascular: S1S2/RRR Respiratory: CTAB  Discharge Instructions   Discharge Instructions    Diet - low sodium heart healthy   Complete by:  As directed    Increase activity slowly   Complete by:  As directed      Allergies as of 07/10/2017   No Known Allergies     Medication List    STOP taking these medications   hydrALAZINE 25 MG tablet Commonly known as:  APRESOLINE     TAKE these medications   acetaminophen 325 MG tablet Commonly known as:  TYLENOL Take 2 tablets (650 mg total) by mouth every 6 (six) hours as needed for mild pain (or Fever >/= 101).   aspirin EC 81 MG tablet Take 81 mg by mouth daily.   b complex vitamins tablet Take 1 tablet by  mouth daily.   cephALEXin 250 MG capsule Commonly known as:  KEFLEX Take 1 capsule (250 mg total) by mouth 3 (three) times daily. For 3days   ferrous gluconate 240 (27 FE) MG tablet Commonly known as:  FERGON Take 240 mg by mouth daily.   furosemide 40 MG tablet Commonly known as:  LASIX Take 80mg  (2tabs) in morning and 40mg  (1tab) in evening What  changed:  additional instructions   OXYGEN Inhale 2 L into the lungs continuous.   tamsulosin 0.4 MG Caps capsule Commonly known as:  FLOMAX Take 1 capsule (0.4 mg total) by mouth daily after supper.      No Known Allergies Follow-up Information    Colon Branch, MD. Go on 07/14/2017.   Specialty:  Internal Medicine Why:  @11 :20am Contact information: [place] [address] [phone]            The results of significant diagnostics from this hospitalization (including imaging, microbiology, ancillary and laboratory) are listed below for reference.    Significant Diagnostic Studies: Dg Chest 2 View  Result Date: 07/06/2017 CLINICAL DATA:  Chest pain EXAM: CHEST  2 VIEW COMPARISON:  06/12/2017 FINDINGS: Chronic cardiomegaly. Stable mediastinal contours. There is mild generalized interstitial coarsening that is chronic. Asymmetric lucency at the right base with air trapping seen on 2018 abdominal CT. Generalized bony sclerosis in this patient with myelofibrosis. No acute osseous finding. IMPRESSION: 1. No acute finding when compared to priors. 2. Chronic cardiomegaly. Electronically Signed   By: Monte Fantasia M.D.   On: 07/06/2017 14:04   Dg Chest 2 View  Result Date: 06/12/2017 CLINICAL DATA:  Patient with productive cough for 1 month. EXAM: CHEST  2 VIEW COMPARISON:  Chest radiograph 01/23/2017. FINDINGS: Central venous catheter tip projects over the superior vena cava. Monitoring leads overlie the patient. Low lung volumes. Stable cardiomegaly. Bilateral heterogeneous pulmonary opacities. No pleural effusion or pneumothorax. Thoracic spine degenerative changes. Redemonstrated heterogeneous appearance of the osseous structures. IMPRESSION: Bilateral heterogeneous pulmonary opacities with considerations including edema or infection. Electronically Signed   By: Lovey Newcomer M.D.   On: 06/12/2017 14:38   Dg Abd 2 Views  Result Date:  06/12/2017 CLINICAL DATA:  Status post sigmoidoscopy with abdominal pain EXAM: ABDOMEN - 2 VIEW COMPARISON:  None. FINDINGS: Scattered large and small bowel gas is noted. No free air is seen. No obstructive changes are seen. Diffuse changes throughout the bony structures are noted consistent with the known history of myeloproliferative disorder. Endoscopic clips are noted in the left lower quadrant. Increased density is noted in the right lung base incompletely evaluated on this exam. Chest x-ray is recommended for further evaluation. IMPRESSION: No free air is noted. Increased opacity in the right lung base which appear progressed when compare with the prior CT examination. Chest x-ray is recommended for further evaluation. Electronically Signed   By: Inez Catalina M.D.   On: 06/12/2017 12:22    Microbiology: Recent Results (from the past 240 hour(s))  Urine C&S     Status: Abnormal   Collection Time: 07/06/17  7:02 AM  Result Value Ref Range Status   Specimen Description URINE, CLEAN CATCH  Final   Special Requests   Final    NONE Performed at [facility], [address], [place], [place] [address]    Culture MULTIPLE SPECIES PRESENT, SUGGEST RECOLLECTION (A)  Final   Report Status 07/07/2017 FINAL  Final     Labs: Basic Metabolic  Panel: Recent Labs  Lab 07/06/17 1514 07/07/17 0712 07/08/17 0458 07/09/17 0415 07/10/17 0456  NA 143 142 140 142 139  K 3.8 3.9 5.0 5.1 4.6  CL 105 102 102 104 104  CO2 28 27 27 27 28   GLUCOSE 143* 109* 99 95 91  BUN 47* 51* 52* 53* 52*  CREATININE 1.18 1.25* 1.28* 1.23 1.18  CALCIUM 10.1 9.8 9.7 9.7 9.4   Liver Function Tests: Recent Labs  Lab 07/06/17 1514  AST 22  ALT 17  ALKPHOS 106  BILITOT 0.4  PROT 6.0*  ALBUMIN 3.1*   No results for input(s): LIPASE, AMYLASE in the last 168 hours. No results for input(s): AMMONIA in the last 168 hours. CBC: Recent Labs  Lab 07/06/17 1514 07/07/17 0712  WBC 10.5 10.4  NEUTROABS 8.5*  --    HGB 9.5* 9.0*  HCT 31.9* 30.3*  MCV 93.0 94.1  PLT 254 229   Cardiac Enzymes: No results for input(s): CKTOTAL, CKMB, CKMBINDEX, TROPONINI in the last 168 hours. BNP: BNP (last 3 results) Recent Labs    12/26/16 1543 01/06/17 0931 07/06/17 1317  BNP 409.8* 435.8* 419.5*    ProBNP (last 3 results) No results for input(s): PROBNP in the last 8760 hours.  CBG: Recent Labs  Lab 07/09/17 1145 07/09/17 1653 07/09/17 2108 07/10/17 0749 07/10/17 1136  GLUCAP 117* 100* 117* 89 104*       Signed:  Domenic Polite MD.  [facility] 07/10/2017, 2:12 PM

## 2017-07-10 NOTE — Progress Notes (Signed)
Patient is discharged home today via ambulance; home address verified; Non emergent ambulance called for transportation home; Aneta Mins [phone]

## 2017-07-12 ENCOUNTER — Telehealth: Payer: Self-pay | Admitting: *Deleted

## 2017-07-12 ENCOUNTER — Telehealth (HOSPITAL_COMMUNITY): Payer: Self-pay

## 2017-07-12 NOTE — Telephone Encounter (Signed)
No answer

## 2017-07-12 NOTE — Telephone Encounter (Signed)
Called pt to sch a home visit, he reports he doesn't need anybody in the home to come see him. I will contact Kennyth Lose to see if she can call him and talk to him about a home visit.   Marylouise Stacks, EMT-Paramedicine  07/12/17

## 2017-07-13 ENCOUNTER — Telehealth: Payer: Self-pay | Admitting: Licensed Clinical Social Worker

## 2017-07-13 NOTE — Telephone Encounter (Signed)
CSW attempted to reach patient to discuss/encourage [facility] program visit. Patient has been reluctant and not allowed home visits. CSW unable to reach patient as one number is disconnected and the other voicemail is full. CSW will communicate with paramedic. Raquel Sarna, [place], [place]

## 2017-07-13 NOTE — Telephone Encounter (Signed)
No answer

## 2017-07-14 ENCOUNTER — Inpatient Hospital Stay: Payer: Self-pay | Admitting: Internal Medicine

## 2017-07-14 DIAGNOSIS — Z0289 Encounter for other administrative examinations: Secondary | ICD-10-CM

## 2017-07-16 ENCOUNTER — Inpatient Hospital Stay (HOSPITAL_COMMUNITY)
Admission: EM | Admit: 2017-07-16 | Discharge: 2017-07-22 | DRG: 690 | Disposition: A | Discharge status: Home / Self Care | Payer: Medicare Other | Attending: Internal Medicine | Admitting: Internal Medicine

## 2017-07-16 ENCOUNTER — Other Ambulatory Visit: Payer: Self-pay

## 2017-07-16 ENCOUNTER — Encounter (HOSPITAL_COMMUNITY): Payer: Self-pay | Admitting: Emergency Medicine

## 2017-07-16 ENCOUNTER — Emergency Department (HOSPITAL_COMMUNITY): Payer: Medicare Other

## 2017-07-16 DIAGNOSIS — Z9981 Dependence on supplemental oxygen: Secondary | ICD-10-CM

## 2017-07-16 DIAGNOSIS — I2510 Atherosclerotic heart disease of native coronary artery without angina pectoris: Secondary | ICD-10-CM | POA: Diagnosis present

## 2017-07-16 DIAGNOSIS — I5032 Chronic diastolic (congestive) heart failure: Secondary | ICD-10-CM | POA: Diagnosis not present

## 2017-07-16 DIAGNOSIS — E1122 Type 2 diabetes mellitus with diabetic chronic kidney disease: Secondary | ICD-10-CM | POA: Diagnosis present

## 2017-07-16 DIAGNOSIS — N390 Urinary tract infection, site not specified: Secondary | ICD-10-CM | POA: Diagnosis not present

## 2017-07-16 DIAGNOSIS — Z1623 Resistance to quinolones and fluoroquinolones: Secondary | ICD-10-CM | POA: Diagnosis present

## 2017-07-16 DIAGNOSIS — B965 Pseudomonas (aeruginosa) (mallei) (pseudomallei) as the cause of diseases classified elsewhere: Secondary | ICD-10-CM | POA: Diagnosis present

## 2017-07-16 DIAGNOSIS — I10 Essential (primary) hypertension: Secondary | ICD-10-CM | POA: Diagnosis not present

## 2017-07-16 DIAGNOSIS — R350 Frequency of micturition: Secondary | ICD-10-CM | POA: Diagnosis not present

## 2017-07-16 DIAGNOSIS — C946 Myelodysplastic disease, not classified: Secondary | ICD-10-CM | POA: Diagnosis not present

## 2017-07-16 DIAGNOSIS — R300 Dysuria: Secondary | ICD-10-CM | POA: Diagnosis not present

## 2017-07-16 DIAGNOSIS — R338 Other retention of urine: Secondary | ICD-10-CM | POA: Diagnosis present

## 2017-07-16 DIAGNOSIS — I130 Hypertensive heart and chronic kidney disease with heart failure and stage 1 through stage 4 chronic kidney disease, or unspecified chronic kidney disease: Secondary | ICD-10-CM | POA: Diagnosis not present

## 2017-07-16 DIAGNOSIS — J111 Influenza due to unidentified influenza virus with other respiratory manifestations: Secondary | ICD-10-CM | POA: Diagnosis not present

## 2017-07-16 DIAGNOSIS — Z87891 Personal history of nicotine dependence: Secondary | ICD-10-CM

## 2017-07-16 DIAGNOSIS — R05 Cough: Secondary | ICD-10-CM | POA: Diagnosis not present

## 2017-07-16 DIAGNOSIS — Z7982 Long term (current) use of aspirin: Secondary | ICD-10-CM

## 2017-07-16 DIAGNOSIS — J9611 Chronic respiratory failure with hypoxia: Secondary | ICD-10-CM | POA: Diagnosis present

## 2017-07-16 DIAGNOSIS — Z79899 Other long term (current) drug therapy: Secondary | ICD-10-CM

## 2017-07-16 DIAGNOSIS — E119 Type 2 diabetes mellitus without complications: Secondary | ICD-10-CM

## 2017-07-16 DIAGNOSIS — N401 Enlarged prostate with lower urinary tract symptoms: Secondary | ICD-10-CM | POA: Diagnosis present

## 2017-07-16 DIAGNOSIS — R3129 Other microscopic hematuria: Secondary | ICD-10-CM | POA: Diagnosis present

## 2017-07-16 DIAGNOSIS — I272 Pulmonary hypertension, unspecified: Secondary | ICD-10-CM | POA: Diagnosis present

## 2017-07-16 DIAGNOSIS — N12 Tubulo-interstitial nephritis, not specified as acute or chronic: Secondary | ICD-10-CM | POA: Diagnosis present

## 2017-07-16 DIAGNOSIS — N183 Chronic kidney disease, stage 3 unspecified: Secondary | ICD-10-CM | POA: Diagnosis present

## 2017-07-16 DIAGNOSIS — D7581 Myelofibrosis: Secondary | ICD-10-CM | POA: Diagnosis present

## 2017-07-16 DIAGNOSIS — R69 Illness, unspecified: Secondary | ICD-10-CM

## 2017-07-16 DIAGNOSIS — G4733 Obstructive sleep apnea (adult) (pediatric): Secondary | ICD-10-CM | POA: Diagnosis present

## 2017-07-16 DIAGNOSIS — D500 Iron deficiency anemia secondary to blood loss (chronic): Secondary | ICD-10-CM | POA: Diagnosis present

## 2017-07-16 LAB — BASIC METABOLIC PANEL
ANION GAP: 10 (ref 5–15)
BUN: 56 mg/dL — AB (ref 6–20)
CO2: 23 mmol/L (ref 22–32)
Calcium: 9.9 mg/dL (ref 8.9–10.3)
Chloride: 105 mmol/L (ref 101–111)
Creatinine, Ser: 1.03 mg/dL (ref 0.61–1.24)
GFR calc Af Amer: >60 mL/min (ref >60)
GLUCOSE: 110 mg/dL — AB (ref 65–99)
Potassium: 4 mmol/L (ref 3.5–5.1)
Sodium: 138 mmol/L (ref 135–145)

## 2017-07-16 LAB — CBC WITH DIFFERENTIAL/PLATELET
Band Neutrophils: 15 %
Basophils Absolute: 0.2 10*3/uL — ABNORMAL HIGH (ref 0.0–0.1)
Basophils Relative: 1 %
Blasts: 0 %
Eosinophils Absolute: 0.3 10*3/uL (ref 0.0–0.7)
Eosinophils Relative: 2 %
HEMATOCRIT: 30 % — AB (ref 39.0–52.0)
Hemoglobin: 9 g/dL — ABNORMAL LOW (ref 13.0–17.0)
LYMPHS PCT: 17 %
Lymphs Abs: 2.8 10*3/uL (ref 0.7–4.0)
MCH: 26.9 pg (ref 26.0–34.0)
MCHC: 30 g/dL (ref 30.0–36.0)
MCV: 89.6 fL (ref 78.0–100.0)
MONOS PCT: 18 %
Metamyelocytes Relative: 11 %
Monocytes Absolute: 3 10*3/uL — ABNORMAL HIGH (ref 0.1–1.0)
Myelocytes: 5 %
NEUTROS PCT: 31 %
NRBC: 0 /100{WBCs}
Neutro Abs: 10.1 10*3/uL — ABNORMAL HIGH (ref 1.7–7.7)
OTHER: 0 %
Platelets: 388 10*3/uL (ref 150–400)
Promyelocytes Absolute: 0 %
RBC: 3.35 MIL/uL — AB (ref 4.22–5.81)
RDW: 16.9 % — AB (ref 11.5–15.5)
WBC: 16.4 10*3/uL — AB (ref 4.0–10.5)

## 2017-07-16 LAB — URINALYSIS, ROUTINE W REFLEX MICROSCOPIC
Bilirubin Urine: NEGATIVE
Glucose, UA: NEGATIVE mg/dL
Ketones, ur: NEGATIVE mg/dL
Nitrite: POSITIVE — AB
Protein, ur: 100 mg/dL — AB
SPECIFIC GRAVITY, URINE: 1.011 (ref 1.005–1.030)
SQUAMOUS EPITHELIAL / LPF: NONE SEEN
pH: 6 (ref 5.0–8.0)

## 2017-07-16 LAB — TROPONIN I

## 2017-07-16 LAB — GLUCOSE, CAPILLARY: Glucose-Capillary: 131 mg/dL — ABNORMAL HIGH (ref 65–99)

## 2017-07-16 LAB — CBG MONITORING, ED: Glucose-Capillary: 102 mg/dL — ABNORMAL HIGH (ref 65–99)

## 2017-07-16 LAB — BRAIN NATRIURETIC PEPTIDE: B Natriuretic Peptide: 499.9 pg/mL — ABNORMAL HIGH (ref 0.0–100.0)

## 2017-07-16 MED ORDER — ENOXAPARIN SODIUM 40 MG/0.4ML ~~LOC~~ SOLN
40.0000 mg | SUBCUTANEOUS | Status: DC
  Administered 2017-07-16 – 2017-07-22 (×7): 40 mg via SUBCUTANEOUS
  Filled 2017-07-16 (×7): qty 0.4

## 2017-07-16 MED ORDER — FERROUS GLUCONATE 324 (38 FE) MG PO TABS
324.0000 mg | ORAL_TABLET | Freq: Every day | ORAL | Status: DC
  Administered 2017-07-16 – 2017-07-22 (×7): 324 mg via ORAL
  Filled 2017-07-16 (×7): qty 1

## 2017-07-16 MED ORDER — FUROSEMIDE 80 MG PO TABS
80.0000 mg | ORAL_TABLET | Freq: Every day | ORAL | Status: DC
  Administered 2017-07-16 – 2017-07-22 (×7): 80 mg via ORAL
  Filled 2017-07-16: qty 4
  Filled 2017-07-16 (×6): qty 1

## 2017-07-16 MED ORDER — FUROSEMIDE 40 MG PO TABS
40.0000 mg | ORAL_TABLET | Freq: Every evening | ORAL | Status: DC
  Administered 2017-07-16 – 2017-07-22 (×7): 40 mg via ORAL
  Filled 2017-07-16 (×7): qty 1

## 2017-07-16 MED ORDER — TRAZODONE HCL 50 MG PO TABS
25.0000 mg | ORAL_TABLET | Freq: Every evening | ORAL | Status: DC | PRN
  Administered 2017-07-19: 25 mg via ORAL
  Filled 2017-07-16: qty 1

## 2017-07-16 MED ORDER — BISACODYL 5 MG PO TBEC: 5.0000 mg | DELAYED_RELEASE_TABLET | Freq: Every day | ORAL | Status: DC | PRN

## 2017-07-16 MED ORDER — ONDANSETRON HCL 4 MG PO TABS
4.0000 mg | ORAL_TABLET | Freq: Four times a day (QID) | ORAL | Status: DC | PRN
  Filled 2017-07-16: qty 1

## 2017-07-16 MED ORDER — BENZONATATE 100 MG PO CAPS
100.0000 mg | ORAL_CAPSULE | Freq: Two times a day (BID) | ORAL | Status: DC | PRN
  Administered 2017-07-18 – 2017-07-22 (×5): 100 mg via ORAL
  Filled 2017-07-16 (×6): qty 1

## 2017-07-16 MED ORDER — INSULIN ASPART 100 UNIT/ML ~~LOC~~ SOLN: 0.0000 [IU] | Freq: Every day | SUBCUTANEOUS | Status: DC

## 2017-07-16 MED ORDER — B COMPLEX PO TABS: 1.0000 | ORAL_TABLET | Freq: Every day | ORAL | Status: DC

## 2017-07-16 MED ORDER — TAMSULOSIN HCL 0.4 MG PO CAPS
0.4000 mg | ORAL_CAPSULE | Freq: Every day | ORAL | Status: DC
  Administered 2017-07-16 – 2017-07-22 (×7): 0.4 mg via ORAL
  Filled 2017-07-16 (×7): qty 1

## 2017-07-16 MED ORDER — INSULIN ASPART 100 UNIT/ML ~~LOC~~ SOLN: 0.0000 [IU] | Freq: Three times a day (TID) | SUBCUTANEOUS | Status: DC

## 2017-07-16 MED ORDER — ONDANSETRON HCL 4 MG/2ML IJ SOLN
4.0000 mg | Freq: Four times a day (QID) | INTRAMUSCULAR | Status: DC | PRN
  Administered 2017-07-18 – 2017-07-19 (×2): 4 mg via INTRAVENOUS
  Filled 2017-07-16 (×2): qty 2

## 2017-07-16 MED ORDER — ASPIRIN EC 81 MG PO TBEC
81.0000 mg | DELAYED_RELEASE_TABLET | Freq: Every day | ORAL | Status: DC
  Administered 2017-07-16 – 2017-07-22 (×7): 81 mg via ORAL
  Filled 2017-07-16 (×7): qty 1

## 2017-07-16 MED ORDER — ACETAMINOPHEN 325 MG PO TABS
650.0000 mg | ORAL_TABLET | Freq: Four times a day (QID) | ORAL | Status: DC | PRN
  Administered 2017-07-20 – 2017-07-21 (×2): 650 mg via ORAL
  Filled 2017-07-16 (×3): qty 2

## 2017-07-16 MED ORDER — SODIUM CHLORIDE 0.9 % IV SOLN
INTRAVENOUS | Status: AC
  Administered 2017-07-16: 19:00:00 via INTRAVENOUS

## 2017-07-16 MED ORDER — SODIUM CHLORIDE 0.9 % IV SOLN
1.0000 g | Freq: Once | INTRAVENOUS | Status: AC
  Administered 2017-07-16: 1 g via INTRAVENOUS
  Filled 2017-07-16: qty 1

## 2017-07-16 MED ORDER — SODIUM CHLORIDE 0.9 % IV SOLN
1.0000 g | Freq: Once | INTRAVENOUS | Status: AC
  Administered 2017-07-16: 1 g via INTRAVENOUS
  Filled 2017-07-16: qty 10

## 2017-07-16 MED ORDER — SODIUM CHLORIDE 0.9 % IV SOLN
1.0000 g | Freq: Three times a day (TID) | INTRAVENOUS | Status: DC
  Administered 2017-07-16 – 2017-07-18 (×7): 1 g via INTRAVENOUS
  Filled 2017-07-16 (×10): qty 1

## 2017-07-16 NOTE — ED Notes (Signed)
Pt angry at nursing staff that he does not have a treatment room, states "I'm very sick I should be back there."

## 2017-07-16 NOTE — ED Provider Notes (Signed)
[facility] EMERGENCY DEPARTMENT Provider Note   CSN: [id number] Arrival date & time: 07/16/17  0251     History   Chief Complaint Chief Complaint  Patient presents with  . Cough  . Recurrent UTI    HPI Juan [name] is a 77 y.o. male.  The history is provided by the patient.  He has a history of diabetes, hypertension, heart failure, myeloproliferative disorder and comes in with complaints of urinary urgency and frequency and dysuria.  He had been treated in the hospital for a urinary tract infection but states he was only discharged with prescription for 9 antibiotic pills and he should have been given at least 20.  He denies fever or chills but has had some sweats.  He also is complaining of cough for the last 3 days.  Cough is productive of some clear sputum and is associated with nasal congestion and generalized myalgias.  There has been no nausea, vomiting, diarrhea.  He denies any sick contacts.  Past Medical History:  Diagnosis Date  . Allergic rhinitis   . Anemia   . Ascending aortic aneurysm ([place]) 03/2014   4.3cm on CT scan  . CAD (coronary artery disease)    dx elsewheer in past, no documentation. Non-ischemic myoview 2007  . Chronic diastolic CHF (congestive heart failure), NYHA class 2 (HCC)    Normal EF w/ grade 1 dd by echo 12/2015  . Edema    R>L leg, u/s 5-12 neg for DVT  . Hemorrhoid   . History of thrombocytosis   . Hypertension   . Iron deficiency anemia due to chronic blood loss 03/31/2017  . 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 Migraine    "once/wk at least" (07/11/2013)  . Myeloproliferative disease ([place])   . On home oxygen therapy    "2L; all the time" (05/03/2017)  . Pyelonephritis 04/29/2017  . Shortness of breath   . Sinus congestion   . Sleep apnea, obstructive    at some point used CPAP, was d/c  years ago  . Type II diabetes mellitus ([place])   . UTI (urinary tract infection) 06/2017    Patient Active Problem List   Diagnosis  Date Noted  . Acute exacerbation of CHF (congestive heart failure) ([place]) 07/06/2017  . UTI (urinary tract infection) 07/06/2017  . Acute GI bleeding 06/07/2017  . Hypokalemia   . Iron deficiency anemia due to chronic blood loss 03/31/2017  . 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 Urinary retention 01/24/2017  . 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 Elevated troponin 11/14/2016  . Recurrent pneumonia 10/31/2016  . Pulmonary hypertension severe   . Sprain of left rotator cuff capsule   . Hyperkalemia   . Paroxysmal SVT (supraventricular tachycardia) ([place]) 04/20/2016  . Type II diabetes mellitus ([place]) 04/20/2016  . Gout 04/20/2016  . B12 deficiency 04/20/2016  . 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 Hydronephrosis   . Thrombocytosis ([place]) 04/07/2016  . Pressure ulcer stage II 12/31/2015  . T wave inversion in EKG 12/29/2015  . PCP NOTES >>>>>>>>>>>>>>>>> 05/02/2015  . Peripheral edema   . Thoracic aortic aneurysm ([place]) 04/26/2014  . CKD (chronic kidney disease) stage 3, GFR 30-59 ml/min (HCC) 04/26/2014  . Generalized weakness 04/06/2014  . Venous stasis dermatitis of both lower extremities   . Morbid obesity due to excess calories ([place]) 03/29/2014  . 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 Poor compliance with advise  05/05/2012  . Annual physical exam 01/14/2012  .  Weight loss 01/14/2012  . BPH (benign prostatic hyperplasia) 01/14/2012  . Myeloproliferative disease ([place]) 09/07/2006  . Obstructive sleep apnea 09/07/2006  . 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 Coronary atherosclerosis 09/07/2006  . Chronic diastolic CHF (congestive heart failure) ([place]) 09/07/2006  . Allergic rhinitis 09/07/2006    Past Surgical History:  Procedure Laterality Date  . FLEXIBLE SIGMOIDOSCOPY N/A 06/08/2017   Procedure: FLEXIBLE SIGMOIDOSCOPY;  Surgeon: Carol Ada, MD;  Location: [facility];  Service: Endoscopy;  Laterality: N/A;  . IR FLUORO GUIDE CV LINE RIGHT  05/07/2017  . IR FLUORO RM 30-60 MIN  05/07/2017  . IR US GUIDE VASC ACCESS RIGHT  05/07/2017  . TOE SURGERY Right    "tried to straighten out big toe" (07/11/2013)       Home Medications    Prior to Admission medications   Medication Sig Start Date End Date Taking? Authorizing Provider  acetaminophen (TYLENOL) 325 MG tablet Take 2 tablets (650 mg total) by mouth every 6 (six) hours as needed for mild pain (or Fever >/= 101). 05/08/17   Rai, Vernelle Emerald, MD  aspirin EC 81 MG tablet Take 81 mg by mouth daily.    [provider]  b complex vitamins tablet Take 1 tablet by mouth daily.    [provider]  cephALEXin (KEFLEX) 250 MG capsule Take 1 capsule (250 mg total) by mouth 3 (three) times daily. For 3days 07/10/17   Domenic Polite, MD  ferrous gluconate (FERGON) 240 (27 FE) MG tablet Take 240 mg by mouth daily.    [provider]  furosemide (LASIX) 40 MG tablet Take 80mg  (2tabs) in morning and 40mg  (1tab) in evening 07/10/17   Domenic Polite, MD  OXYGEN Inhale 2 L into the lungs continuous.    [provider]  tamsulosin (FLOMAX) 0.4 MG CAPS capsule Take 1 capsule (0.4 mg total) by mouth daily after supper. 06/13/17   Bonnielee Haff, MD    Family History Family History  Problem Relation Age of Onset  . Schizophrenia Son   . Mental illness Daughter   . Colon cancer Neg Hx   . Prostate cancer Neg Hx   . Heart attack Neg Hx   . Diabetes Neg Hx     Social History Social History   Tobacco Use  . Smoking status: Former Smoker    Packs/day: 0.25    Years: 12.00    Pack years: 3.00    Types: Cigarettes    Last attempt to quit: 05/18/1966    Years since quitting: 51.1  . Smokeless tobacco: Never Used  . Tobacco comment: quit smoking 45 years ago  Substance Use Topics  . Alcohol use: No    Alcohol/week: 0.0 oz  . Drug use: No     Allergies   Patient has  no known allergies.   Review of Systems Review of Systems  All other systems reviewed and are negative.    Physical Exam Updated Vital Signs BP (!) 179/82 (BP Location: Right Arm)   Pulse 93   Temp 98.6 F (37 C) (Oral)   Resp 16   Ht 6\' 3"  (1.905 m)   Wt 80.3 kg (177 lb)   SpO2 93%   BMI 22.12 kg/m   Physical Exam  Nursing note and vitals reviewed.  77 year old male, resting comfortably and in no acute distress. Vital signs are significant for elevated systolic blood pressure. Oxygen saturation is 93%, which is normal. Head  is normocephalic and atraumatic. PERRLA, EOMI. Oropharynx is clear. Neck is nontender and supple without adenopathy or JVD. Back is nontender and there is no CVA tenderness. Lungs are clear without rales, wheezes, or rhonchi. Chest is nontender. Heart has regular rate and rhythm without murmur. Abdomen is soft, flat, nontender without masses or hepatosplenomegaly and peristalsis is normoactive. Extremities have no cyanosis or edema, full range of motion is present. Skin is warm and dry without rash. Neurologic: Mental status is normal, cranial nerves are intact, there are no motor or sensory deficits.  ED Treatments / Results  Labs (all labs ordered are listed, but only abnormal results are displayed) Labs Reviewed  URINALYSIS, ROUTINE W REFLEX MICROSCOPIC - Abnormal; Notable for the following components:      Result Value   APPearance TURBID (*)    Hgb urine dipstick SMALL (*)    Protein, ur 100 (*)    Nitrite POSITIVE (*)    Leukocytes, UA LARGE (*)    Bacteria, UA MANY (*)    All other components within normal limits  CBC WITH DIFFERENTIAL/PLATELET - Abnormal; Notable for the following components:   WBC 16.4 (*)    RBC 3.35 (*)    Hemoglobin 9.0 (*)    HCT 30.0 (*)    RDW 16.9 (*)    Neutro Abs 10.1 (*)    Monocytes Absolute 3.0 (*)    Basophils Absolute 0.2 (*)    All other components within normal limits  BASIC METABOLIC PANEL -  Abnormal; Notable for the following components:   Glucose, Bld 110 (*)    BUN 56 (*)    All other components within normal limits  BRAIN NATRIURETIC PEPTIDE - Abnormal; Notable for the following components:   B Natriuretic Peptide 499.9 (*)    All other components within normal limits  URINE CULTURE  TROPONIN I   Radiology Dg Chest 2 View  Result Date: 07/16/2017 CLINICAL DATA:  77 year old male with cough. EXAM: CHEST  2 VIEW COMPARISON:  Chest radiograph dated 07/06/2017 FINDINGS: There is cardiomegaly with vascular congestion and possible mild interstitial edema. Superimposed pneumonia is not excluded. Clinical correlation is recommended. No focal consolidation, pleural effusion, or pneumothorax. There is diffuse osteosclerosis consistent with history of myeloproliferative disease. No acute osseous pathology. IMPRESSION: 1. Cardiomegaly with vascular congestion and possible mild interstitial edema. 2. Osteosclerosis. Electronically Signed   By: Anner Crete M.D.   On: 07/16/2017 05:46    Procedures Procedures  Medications Ordered in ED Medications  ceFEPIme (MAXIPIME) 1 g in sodium chloride 0.9 % 100 mL IVPB (not administered)  cefTRIAXone (ROCEPHIN) 1 g in sodium chloride 0.9 % 100 mL IVPB (0 g Intravenous Stopped 07/16/17 0801)     Initial Impression / Assessment and Plan / ED Course  I have reviewed the triage vital signs and the nursing notes.  Pertinent labs & imaging results that were available during my care of the patient were reviewed by me and considered in my medical decision making (see chart for details).  Influenza-like illness.  Probable recurrent urinary tract infection.  Old records are reviewed, and he was admitted to the hospital 10 days ago at which time a urine culture showed multiple organisms and felt to be contaminated.  Also, urine culture done in very had multiple organisms.  Urine culture in December showed pseudomonas aeruginosa which was resistant to  ciprofloxacin but sensitive to numerous other agents.  At recent hospitalization he was given 5 days of cefepime and discharged with 3  days of cephalexin.  WBC is significantly elevated.  Chest x-ray shows no evidence of pneumonia.  Ceftriaxone had been inadvertently ordered by physician's assistant, not likely to be effective in patient who was known to be infected with Pseudomonas.  At this point, I do not see any oral antibiotic that is likely to be effective for him.  He is given a dose of cefepime.  Urine has been sent for culture.  Anticipate need for short-term hospital admission and probable set up with PICC line for continuing outpatient intravenous antibiotics until UTI is cleared.  Final Clinical Impressions(s) / ED Diagnoses   Final diagnoses:  Urinary tract infection without hematuria, site unspecified  Influenza-like illness    ED Discharge Orders    None       Delora Fuel, MD 39/67/28 [phone]

## 2017-07-16 NOTE — ED Triage Notes (Signed)
Pt reports cough and continued urinary s/s, states antibiotics "weren't enough, they only gave me 9 pills."

## 2017-07-16 NOTE — Progress Notes (Signed)
Pharmacy Antibiotic Note  Juan [name] is a 77 y.o. male admitted on 07/16/2017 with dysuria, dyspnea and LE swelling/weakness.  Pharmacy has been consulted for cefepime dosing for UTI.  UA dirty and patient previously grew Pseudomonas and E.coli.  Baseline labs reviewed.  Plan: Cefepime 1gm IV Q8H Monitor renal fxn, micro data, abx LOT   Height: 6\' 3"  (190.5 cm) Weight: 177 lb (80.3 kg) IBW/kg (Calculated) : 84.5  Temp (24hrs), Avg:98.6 F (37 C), Min:98.6 F (37 C), Max:98.6 F (37 C)  Recent Labs  Lab 07/10/17 0456 07/16/17 0558  WBC  --  16.4*  CREATININE 1.18 1.03    Estimated Creatinine Clearance: 70.4 mL/min (by C-G formula based on SCr of 1.03 mg/dL).    No Known Allergies    Cefepime 3/1 >> CTX x1 dose  3/1 UCx -    Kaavya Puskarich D. Mina Marble, PharmD, BCPS Pager:  [phone] 07/16/2017, 9:30 AM

## 2017-07-16 NOTE — H&P (Signed)
History and Physical    Juan [name] NLZ:[id number] DOB: May 16, 1941 DOA: 07/16/2017  PCP: Colon Branch, MD Patient coming from: home  Chief Complaint: dysuria/frequency/urgency  HPI: Juan [name] is a cantankerous  76 y.o. male with medical history significant for diastolic congestive heart failure, severe pulmonary hypertension on home oxygen, iron deficiency anemia, hypertension, coronary artery disease, urinary retention, obstructive sleep apnea, myeloproliferative disorder recent hospitalization for urinary tract infection presents to the emergency Department chief complaint dysuria frequency and urgency. Initial evaluation reveals leukocytosis and urinalysis consistent with UTI. [facility] are asked to admit  Information is obtained from the patient and the chart. He states he went home last week or being hospitalized for UTI reports continued dysuria frequency and urgency. He believes he was sent home with not enough antibiotics. Addition he complains of nonproductive cough with nasal congestion for the last 3 days. He denies subjective fever chills, headache dizziness syncope or near-syncope. He denies worsening shortness of breath lower extremity edema abdominal pain nausea vomiting diarrhea.    ED Course: In the emergency department he's afebrile hemodynamically stable and not hypoxic. He is given Rocephin initially and then changed to cefapime due to urine culture with Pseudomonas in December  Review of Systems: As per HPI otherwise all other systems reviewed and are negative.   Ambulatory Status: Patient ambulates with a cane with a very unsteady gait. Reports he also has a walker and wheelchair. Of note he was evaluated by physical therapy his last hospitalization who recommended SNIF and he adamantly refused. He also refused home physical therapy  Past Medical History:  Diagnosis Date  . Allergic rhinitis   . Anemia   . Ascending aortic aneurysm ([place]) 03/2014   4.3cm on CT  scan  . CAD (coronary artery disease)    dx elsewheer in past, no documentation. Non-ischemic myoview 2007  . Chronic diastolic CHF (congestive heart failure), NYHA class 2 (HCC)    Normal EF w/ grade 1 dd by echo 12/2015  . Edema    R>L leg, u/s 5-12 neg for DVT  . Hemorrhoid   . History of thrombocytosis   . Hypertension   . Iron deficiency anemia due to chronic blood loss 03/31/2017  . 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 Migraine    "once/wk at least" (07/11/2013)  . Myeloproliferative disease ([place])   . On home oxygen therapy    "2L; all the time" (05/03/2017)  . Pyelonephritis 04/29/2017  . Shortness of breath   . Sinus congestion   . Sleep apnea, obstructive    at some point used CPAP, was d/c  years ago  . Type II diabetes mellitus ([place])   . UTI (urinary tract infection) 06/2017    Past Surgical History:  Procedure Laterality Date  . FLEXIBLE SIGMOIDOSCOPY N/A 06/08/2017   Procedure: FLEXIBLE SIGMOIDOSCOPY;  Surgeon: Carol Ada, MD;  Location: [facility];  Service: Endoscopy;  Laterality: N/A;  . IR FLUORO GUIDE CV LINE RIGHT  05/07/2017  . IR FLUORO RM 30-60 MIN  05/07/2017  . IR US GUIDE VASC ACCESS RIGHT  05/07/2017  . TOE SURGERY Right    "tried to straighten out big toe" (07/11/2013)    Social History   Socioeconomic History  . Marital status: Widowed    Spouse name: Not on file  . Number of children: 2  . Years of education: Not on file  . Highest education level: Not on file  Social Needs  . Financial resource strain: Not on file  .  Food insecurity - worry: Not on file  . Food insecurity - inability: Not on file  . Transportation needs - medical: Not on file  . Transportation needs - non-medical: Not on file  Occupational History  . Occupation: retired     Fish farm manager: RETIRED  Tobacco Use  . Smoking status: Former Smoker    Packs/day: 0.25    Years: 12.00    Pack years: 3.00    Types: Cigarettes    Last attempt to quit: 05/18/1966    Years since  quitting: 51.1  . Smokeless tobacco: Never Used  . Tobacco comment: quit smoking 45 years ago  Substance and Sexual Activity  . Alcohol use: No    Alcohol/week: 0.0 oz  . Drug use: No  . Sexual activity: Yes  Other Topics Concern  . Not on file  Social History Narrative   Moved from [place] 2006   Widow 2007   Son lives with him       Admitted to [facility] 05/08/17   Former smoker-stopped 1968   Alcohol none   Full Code    No Known Allergies  Family History  Problem Relation Age of Onset  . Schizophrenia Son   . Mental illness Daughter   . Colon cancer Neg Hx   . Prostate cancer Neg Hx   . Heart attack Neg Hx   . Diabetes Neg Hx     Prior to Admission medications   Medication Sig Start Date End Date Taking? Authorizing Provider  acetaminophen (TYLENOL) 325 MG tablet Take 2 tablets (650 mg total) by mouth every 6 (six) hours as needed for mild pain (or Fever >/= 101). 05/08/17  Yes Rai, Ripudeep K, MD  aspirin EC 81 MG tablet Take 81 mg by mouth daily.   Yes [provider]  b complex vitamins tablet Take 1 tablet by mouth daily.   Yes [provider]  ferrous gluconate (FERGON) 240 (27 FE) MG tablet Take 240 mg by mouth daily.   Yes [provider]  furosemide (LASIX) 40 MG tablet Take 80mg  (2tabs) in morning and 40mg  (1tab) in evening 07/10/17  Yes Domenic Polite, MD  OXYGEN Inhale 2 L into the lungs continuous.   Yes [provider]  tamsulosin (FLOMAX) 0.4 MG CAPS capsule Take 1 capsule (0.4 mg total) by mouth daily after supper. 06/13/17  Yes Bonnielee Haff, MD    Physical Exam: Vitals:   07/16/17 0800 07/16/17 0815 07/16/17 0830 07/16/17 0845  BP: (!) 164/74 (!) 152/110 (!) 160/77 (!) 154/67  Pulse: 75 77 66 77  Resp: 17 15 15 16   Temp:      TempSrc:      SpO2: 98% 98% 100% 100%  Weight:      Height:         General:  Appears calm and comfortable, chronically ill in no acute distress Eyes:  PERRL, EOMI,  normal lids, iris ENT:  grossly normal hearing, lips & tongue, because membranes of his mouth are pink but dry Neck:  no LAD, masses or thyromegaly Cardiovascular:  RRR, no m/r/g. Trace -1+ LE edema.  Respiratory:  CTA bilaterally, no w/r/r. Normal respiratory effort. Abdomen:  soft, ntnd, positive bowel sounds throughout no guarding or rebounding Skin:  no rash or induration seen on limited exam Musculoskeletal:  grossly normal tone BUE/BLE, good ROM, no bony abnormality Psychiatric:  grossly normal mood and affect, speech fluent and appropriate, AOx3 Neurologic:  CN 2-12 grossly intact, moves all  extremities in coordinated fashion, sensation intact  Labs on Admission: I have personally reviewed following labs and imaging studies  CBC: Recent Labs  Lab 07/16/17 0558  WBC 16.4*  NEUTROABS 10.1*  HGB 9.0*  HCT 30.0*  MCV 89.6  PLT 007   Basic Metabolic Panel: Recent Labs  Lab 07/10/17 0456 07/16/17 0558  NA 139 138  K 4.6 4.0  CL 104 105  CO2 28 23  GLUCOSE 91 110*  BUN 52* 56*  CREATININE 1.18 1.03  CALCIUM 9.4 9.9   GFR: Estimated Creatinine Clearance: 70.4 mL/min (by C-G formula based on SCr of 1.03 mg/dL). Liver Function Tests: No results for input(s): AST, ALT, ALKPHOS, BILITOT, PROT, ALBUMIN in the last 168 hours. No results for input(s): LIPASE, AMYLASE in the last 168 hours. No results for input(s): AMMONIA in the last 168 hours. Coagulation Profile: No results for input(s): INR, PROTIME in the last 168 hours. Cardiac Enzymes: Recent Labs  Lab 07/16/17 0558  TROPONINI <0.03   BNP (last 3 results) No results for input(s): PROBNP in the last 8760 hours. HbA1C: No results for input(s): HGBA1C in the last 72 hours. CBG: Recent Labs  Lab 07/09/17 1145 07/09/17 1653 07/09/17 2108 07/10/17 0749 07/10/17 1136  GLUCAP 117* 100* 117* 89 104*   Lipid Profile: No results for input(s): CHOL, HDL, LDLCALC, TRIG, CHOLHDL, LDLDIRECT in the last 72  hours. Thyroid Function Tests: No results for input(s): TSH, T4TOTAL, FREET4, T3FREE, THYROIDAB in the last 72 hours. Anemia Panel: No results for input(s): VITAMINB12, FOLATE, FERRITIN, TIBC, IRON, RETICCTPCT in the last 72 hours. Urine analysis:    Component Value Date/Time   COLORURINE YELLOW 07/16/2017 0458   APPEARANCEUR TURBID (A) 07/16/2017 0458   LABSPEC 1.011 07/16/2017 0458   PHURINE 6.0 07/16/2017 0458   GLUCOSEU NEGATIVE 07/16/2017 0458   GLUCOSEU NEGATIVE 05/02/2015 1008   HGBUR SMALL (A) 07/16/2017 0458   BILIRUBINUR NEGATIVE 07/16/2017 0458   KETONESUR NEGATIVE 07/16/2017 0458   PROTEINUR 100 (A) 07/16/2017 0458   UROBILINOGEN 0.2 05/02/2015 1008   NITRITE POSITIVE (A) 07/16/2017 0458   LEUKOCYTESUR LARGE (A) 07/16/2017 0458    Creatinine Clearance: Estimated Creatinine Clearance: 70.4 mL/min (by C-G formula based on SCr of 1.03 mg/dL).  Sepsis Labs: @LABRCNTIP (procalcitonin:4,lacticidven:4) )No results found for this or any previous visit (from the past 240 hour(s)).   Radiological Exams on Admission: Dg Chest 2 View  Result Date: 07/16/2017 CLINICAL DATA:  77 year old male with cough. EXAM: CHEST  2 VIEW COMPARISON:  Chest radiograph dated 07/06/2017 FINDINGS: There is cardiomegaly with vascular congestion and possible mild interstitial edema. Superimposed pneumonia is not excluded. Clinical correlation is recommended. No focal consolidation, pleural effusion, or pneumothorax. There is diffuse osteosclerosis consistent with history of myeloproliferative disease. No acute osseous pathology. IMPRESSION: 1. Cardiomegaly with vascular congestion and possible mild interstitial edema. 2. Osteosclerosis. Electronically Signed   By: Anner Crete M.D.   On: 07/16/2017 05:46    EKG:   Assessment/Plan Principal Problem:   Recurrent UTI Active Problems:   Myeloproliferative disease (HCC)   Obstructive sleep apnea   Chronic diastolic CHF (congestive heart failure)  (HCC)   Morbid obesity due to excess calories (HCC)   CKD (chronic kidney disease) stage 3, GFR 30-59 ml/min (HCC)   Type II diabetes mellitus (HCC)   Iron deficiency anemia due to chronic blood loss   #1. Recurrent urinary tract infection. Likely related to hx urinary retention in setting of BPH. Urinalysis with positive leukocytes positive nitrites too many  to count WBCs and many bacteria.  Urine culture positive for Pseudomonas 2 months ago resistant to cipro and 9/8 culture the same. 11/28 culture + for e coli resistant to Ampicillin, Cipro and Bactrim. Patient was given one dose of Rocephin in the emergency department and then changed to cefepime. He is afebrile hemodynamically stable and nontoxic appearing -Admit -Continue cefepime -Follow urine culture -Supportive therapy  #2. Chronic diastolic heart failure. Appears compensated. Recently hospitalized for acute on chronic CHF with known severe pulmonary hypertension. Recent echo reveals an EF of 55-60%. BNP today 499 just a little above level at recent discharge. Home medications include Lasix.  -Continue home Lasix -Obtain daily weights -Monitor urine output  #3. Cough. Chest x-ray cardiomegaly with vascular congestion. Does have a leukocytosis but also history of myeloproliferative disease. He's afebrile hemodynamically stable and not hypoxic. -continue lasix -tessalon perle -incentive spirometry -monitor  #4. Chronic kidney disease. Stage III. Creatinine within the limits of normal  Admission. Chart review indicates ACE inhibitor recently discontinued -Monitor intake and output -Hold nephrotoxins -monitor  #5. Diabetes type 2. Diet controlled. Serum glucose 101 on admission. -carb modified diet  #6. Obstructive sleep apnea -cpap  7.myelofibrosis. Stable -OP follow up with heme  #8. Deconditioning/debility. Chart review indicates patient evaluated last hospitalization by physical therapy and deemed very unsteady and  recommended sniff. According to chart patient adamantly declined sniff and home health PT and was not even open to discussion    DVT prophylaxis: lovenox  Code Status: full  Family Communication: none present  Disposition Plan: home but needs snf but refuses (verbalized wanting to stay hospital 3-4 days)  Consults called: none  Admission status: obs    Radene Gunning MD [facility]  If 7PM-7AM, please contact night-coverage [email or web address] Password [facility]  07/16/2017, 9:31 AM

## 2017-07-16 NOTE — Clinical Social Work Note (Signed)
Clinical Social Work Assessment  Patient Details  Name: Oriel Rumbold MRN: [id number] Date of Birth: 02-Nov-1940  Date of referral:  07/16/17               Reason for consult:  Other (Comment Required)                Permission sought to share information with:    Permission granted to share information::  No  Name::        Agency::     Relationship::     Contact Information:     Housing/Transportation Living arrangements for the past 2 months:  Apartment(with son ) Source of Information:  Patient Patient Interpreter Needed:  None Criminal Activity/Legal Involvement Pertinent to Current Situation/Hospitalization:  No - Comment as needed Significant Relationships:  Other Family Members Lives with:  Self, Adult Children Do you feel safe going back to the place where you live?  Yes Need for family participation in patient care:  Yes (Comment)  Care giving concerns:  CSW spoke with pt at bedside. At this time pt did not express any concerns to CSW.    Social Worker assessment / plan:  CSW spoke with pt at bedside, During this time CSW was informed that pt is from home where pt reports pt's son lives with pt. CSW was informed that pt does not want [facility] services or placement as pt has support from son, grandson and daughter at this time.   Employment status:  Retired Forensic scientist:  Medicare PT Recommendations:  Not assessed at this time Information / Referral to community resources:     Patient/Family's Response to care:  Pt expressed understanding to plan of care at this time.   Patient/Family's Understanding of and Emotional Response to Diagnosis, Current Treatment, and Prognosis:  No further questions or concerns have been presented to CSW at this time.   Emotional Assessment Appearance:  Appears stated age Attitude/Demeanor/Rapport:  Engaged Affect (typically observed):  Appropriate, Restless Orientation:  Oriented to Self, Oriented to Place, Oriented to  Time, Oriented to  Situation Alcohol / Substance use:  Not Applicable Psych involvement (Current and /or in the community):  No (Comment)  Discharge Needs  Concerns to be addressed:  No discharge needs identified Readmission within the last 30 days:  No Current discharge risk:  None Barriers to Discharge:  Continued Medical Work up   [employer], [place] 07/16/2017, 10:23 AM

## 2017-07-16 NOTE — ED Notes (Signed)
Lunch tray ordered; heart healthy/carb modiified

## 2017-07-16 NOTE — ED Notes (Signed)
Unable to start peripheral IV despite several attempts , EDP notified , IV team consult ordered.

## 2017-07-16 NOTE — ED Notes (Signed)
Lunch ordered 

## 2017-07-16 NOTE — ED Notes (Addendum)
Patient shouting repeatedly "Hey nurse" until a staff member enters the room.  Patient is aware of how to use the call [place].

## 2017-07-17 DIAGNOSIS — J9611 Chronic respiratory failure with hypoxia: Secondary | ICD-10-CM | POA: Diagnosis present

## 2017-07-17 DIAGNOSIS — D759 Disease of blood and blood-forming organs, unspecified: Secondary | ICD-10-CM | POA: Diagnosis not present

## 2017-07-17 DIAGNOSIS — D500 Iron deficiency anemia secondary to blood loss (chronic): Secondary | ICD-10-CM | POA: Diagnosis present

## 2017-07-17 DIAGNOSIS — E1122 Type 2 diabetes mellitus with diabetic chronic kidney disease: Secondary | ICD-10-CM | POA: Diagnosis present

## 2017-07-17 DIAGNOSIS — Z9981 Dependence on supplemental oxygen: Secondary | ICD-10-CM | POA: Diagnosis not present

## 2017-07-17 DIAGNOSIS — Z1623 Resistance to quinolones and fluoroquinolones: Secondary | ICD-10-CM | POA: Diagnosis present

## 2017-07-17 DIAGNOSIS — B965 Pseudomonas (aeruginosa) (mallei) (pseudomallei) as the cause of diseases classified elsewhere: Secondary | ICD-10-CM | POA: Diagnosis present

## 2017-07-17 DIAGNOSIS — I272 Pulmonary hypertension, unspecified: Secondary | ICD-10-CM | POA: Diagnosis present

## 2017-07-17 DIAGNOSIS — R338 Other retention of urine: Secondary | ICD-10-CM | POA: Diagnosis present

## 2017-07-17 DIAGNOSIS — D7581 Myelofibrosis: Secondary | ICD-10-CM | POA: Diagnosis present

## 2017-07-17 DIAGNOSIS — I5032 Chronic diastolic (congestive) heart failure: Secondary | ICD-10-CM | POA: Diagnosis not present

## 2017-07-17 DIAGNOSIS — R3129 Other microscopic hematuria: Secondary | ICD-10-CM | POA: Diagnosis present

## 2017-07-17 DIAGNOSIS — Z7982 Long term (current) use of aspirin: Secondary | ICD-10-CM | POA: Diagnosis not present

## 2017-07-17 DIAGNOSIS — N401 Enlarged prostate with lower urinary tract symptoms: Secondary | ICD-10-CM | POA: Diagnosis present

## 2017-07-17 DIAGNOSIS — I130 Hypertensive heart and chronic kidney disease with heart failure and stage 1 through stage 4 chronic kidney disease, or unspecified chronic kidney disease: Secondary | ICD-10-CM | POA: Diagnosis present

## 2017-07-17 DIAGNOSIS — N183 Chronic kidney disease, stage 3 (moderate): Secondary | ICD-10-CM | POA: Diagnosis not present

## 2017-07-17 DIAGNOSIS — N390 Urinary tract infection, site not specified: Secondary | ICD-10-CM | POA: Diagnosis not present

## 2017-07-17 DIAGNOSIS — Z79899 Other long term (current) drug therapy: Secondary | ICD-10-CM | POA: Diagnosis not present

## 2017-07-17 DIAGNOSIS — C946 Myelodysplastic disease, not classified: Secondary | ICD-10-CM | POA: Diagnosis present

## 2017-07-17 DIAGNOSIS — G4733 Obstructive sleep apnea (adult) (pediatric): Secondary | ICD-10-CM | POA: Diagnosis not present

## 2017-07-17 DIAGNOSIS — Z87891 Personal history of nicotine dependence: Secondary | ICD-10-CM | POA: Diagnosis not present

## 2017-07-17 DIAGNOSIS — R100 Acute abdomen: Secondary | ICD-10-CM | POA: Diagnosis not present

## 2017-07-17 DIAGNOSIS — I2510 Atherosclerotic heart disease of native coronary artery without angina pectoris: Secondary | ICD-10-CM | POA: Diagnosis present

## 2017-07-17 DIAGNOSIS — N12 Tubulo-interstitial nephritis, not specified as acute or chronic: Secondary | ICD-10-CM | POA: Diagnosis present

## 2017-07-17 LAB — CBC
HCT: 29 % — ABNORMAL LOW (ref 39.0–52.0)
HEMOGLOBIN: 8.6 g/dL — AB (ref 13.0–17.0)
MCH: 27 pg (ref 26.0–34.0)
MCHC: 29.7 g/dL — ABNORMAL LOW (ref 30.0–36.0)
MCV: 90.9 fL (ref 78.0–100.0)
PLATELETS: 362 10*3/uL (ref 150–400)
RBC: 3.19 MIL/uL — AB (ref 4.22–5.81)
RDW: 16.9 % — ABNORMAL HIGH (ref 11.5–15.5)
WBC: 13.8 10*3/uL — AB (ref 4.0–10.5)

## 2017-07-17 LAB — BASIC METABOLIC PANEL
ANION GAP: 9 (ref 5–15)
BUN: 52 mg/dL — ABNORMAL HIGH (ref 6–20)
CHLORIDE: 105 mmol/L (ref 101–111)
CO2: 25 mmol/L (ref 22–32)
Calcium: 9.6 mg/dL (ref 8.9–10.3)
Creatinine, Ser: 1.12 mg/dL (ref 0.61–1.24)
GFR calc Af Amer: >60 mL/min (ref >60)
GFR calc non Af Amer: >60 mL/min (ref >60)
Glucose, Bld: 100 mg/dL — ABNORMAL HIGH (ref 65–99)
POTASSIUM: 4.3 mmol/L (ref 3.5–5.1)
SODIUM: 139 mmol/L (ref 135–145)

## 2017-07-17 LAB — GLUCOSE, CAPILLARY
GLUCOSE-CAPILLARY: 94 mg/dL (ref 65–99)
GLUCOSE-CAPILLARY: 130 mg/dL — AB (ref 65–99)
Glucose-Capillary: 100 mg/dL — ABNORMAL HIGH (ref 65–99)
Glucose-Capillary: 98 mg/dL (ref 65–99)

## 2017-07-17 MED ORDER — MIRABEGRON ER 25 MG PO TB24
25.0000 mg | ORAL_TABLET | Freq: Every day | ORAL | Status: DC
  Administered 2017-07-17 – 2017-07-22 (×6): 25 mg via ORAL
  Filled 2017-07-17 (×6): qty 1

## 2017-07-17 NOTE — Progress Notes (Signed)
Hospitalist progress note   Lenox Bink  GHW:[id number] DOB: [birth year] DOA: 07/16/2017 PCP: Colon Branch, MD   Specialists:   Brief Narrative:  77 year old male  Pulmonary hypertension-severe, chronic diastolic heart failure based on echo 09/2016 EF 55-60 Diabetes mellitus type 2 diet-controlled OSA on CPAP CKD 3 Myelofibrosis followed by Dr. Marin Olp on conservative management Rectal bleed 05/2017-sigmoidoscopy = diverticulosis Rx acute Pseudomonas with pyelonephritis and PICC line 04/2017 Gout Sleep apnea  Stopped antibiotics and started having symptoms of dysuria increased frequency of urination found to have abnormal UA andrecent admission 2/19-2/23--was started on cefepime in the ED secondary to Pseudomonas on prior growth White count on admission was 16, BUN/creatinine 52/1.1 which is about his baseline, CXR cardiomegaly with vascular congestion   Assessment & Plan:   Assessment:  The primary encounter diagnosis was Urinary tract infection without hematuria, site unspecified. A diagnosis of Influenza-like illness was also pertinent to this visit.  Recurrent urinary tract infection-sepsis-follow urine culture, continue cefepime for now and monitor--patient is going Pseudomonas once again and it appears that patient was given only 9 tablets on discharge from last stay which should be enough for simple cystitis not complicated urinary infection.  Continue IV antibiotics for now and await sensitivity L UTS adding MIrabegron as suspect that the patient does have some retention of urine he is saying he has a sensation of pain when passing urine and this may help him void completely and prevent urinary infections recurrent Severe pulmonary hypertension with chronic diastolic heart failure EF 55%-continue Lasix 80 a.m., 40 p.m., monitor respiratory symptoms Diabetes mellitus type 2-diet controlled OSA on CPAP-we will reorder CPAP  at home settings-is on oxygen at home for chronic respiratory  failure 2 L continuously Chronic kidney disease stage III-BUN/creatinine is stable -previously on metolazone which is not ordered at this time Myelofibrosis on conservative management-needs monitoring as an outpatient-continue ferrous gluconate 240 daily BPH continue Flomax 1 capsule nightly Previous rectal bleed 05/2017 status 4 units of blood transfusion-only on aspirin Gout-not on any medications at this time monitor    DVT prophylaxis: lovenox  Code Status:   full   Family Communication:    none  Disposition Plan: home when stabilized   Consultants:   none  Procedures:   none  Antimicrobials:    cefepime   Subjective: Awake alert pleasant oriented no distress  Objective: Vitals:   07/16/17 1215 07/16/17 1522 07/16/17 2131 07/17/17 0438  BP: (!) 158/81 (!) 159/80 (!) 145/67 (!) 143/70  Pulse: 66 78 85 82  Resp: 18 18 18 18   Temp:  97.8 F (36.6 C) 98.1 F (36.7 C) 98 F (36.7 C)  TempSrc:  Oral Oral   SpO2: 98% 98% 98% 98%  Weight:  86.5 kg (190 lb 11.2 oz)    Height:  6\' 3"  (1.905 m)      Intake/Output Summary (Last 24 hours) at 07/17/2017 0743 Last data filed at 07/17/2017 0439 Gross per 24 hour  Intake 120 ml  Output 3401 ml  Net -3281 ml   Filed Weights   07/16/17 0303 07/16/17 1522  Weight: 80.3 kg (177 lb) 86.5 kg (190 lb 11.2 oz)    Examination:  Awake alert pleasant oriented EOMI NCAT S1-S2 no murmur rub or gallop Abdomen soft nontender no rebound no guarding condom catheter in place No lower extremity edema Neurologically intact Power 5/5 Chest is clinically clear    Data Reviewed: I have personally reviewed following labs and imaging studies  CBC: Recent Labs  Lab  07/16/17 0558 07/17/17 0312  WBC 16.4* 13.8*  NEUTROABS 10.1*  --   HGB 9.0* 8.6*  HCT 30.0* 29.0*  MCV 89.6 90.9  PLT 388 116   Basic Metabolic Panel: Recent Labs  Lab 07/16/17 0558 07/17/17 0312  NA 138 139  K 4.0 4.3  CL 105 105  CO2 23 25  GLUCOSE 110*  100*  BUN 56* 52*  CREATININE 1.03 1.12  CALCIUM 9.9 9.6   GFR: Estimated Creatinine Clearance: 68.1 mL/min (by C-G formula based on SCr of 1.12 mg/dL). Liver Function Tests: No results for input(s): AST, ALT, ALKPHOS, BILITOT, PROT, ALBUMIN in the last 168 hours. No results for input(s): LIPASE, AMYLASE in the last 168 hours. No results for input(s): AMMONIA in the last 168 hours. Coagulation Profile: No results for input(s): INR, PROTIME in the last 168 hours. Cardiac Enzymes: Recent Labs  Lab 07/16/17 0558  TROPONINI <0.03   CBG: Recent Labs  Lab 07/10/17 0749 07/10/17 1136 07/16/17 1209 07/16/17 2137  GLUCAP 89 104* 102* 131*   Urine analysis:    Component Value Date/Time   COLORURINE YELLOW 07/16/2017 0458   APPEARANCEUR TURBID (A) 07/16/2017 0458   LABSPEC 1.011 07/16/2017 0458   PHURINE 6.0 07/16/2017 0458   GLUCOSEU NEGATIVE 07/16/2017 0458   GLUCOSEU NEGATIVE 05/02/2015 1008   HGBUR SMALL (A) 07/16/2017 0458   BILIRUBINUR NEGATIVE 07/16/2017 0458   KETONESUR NEGATIVE 07/16/2017 0458   PROTEINUR 100 (A) 07/16/2017 0458   UROBILINOGEN 0.2 05/02/2015 1008   NITRITE POSITIVE (A) 07/16/2017 0458   LEUKOCYTESUR LARGE (A) 07/16/2017 0458     Radiology Studies: Reviewed images personally in health database    Scheduled Meds: . aspirin EC  81 mg Oral Daily  . enoxaparin (LOVENOX) injection  40 mg Subcutaneous Q24H  . ferrous gluconate  324 mg Oral Daily  . furosemide  40 mg Oral QPM  . furosemide  80 mg Oral Daily  . tamsulosin  0.4 mg Oral QPC supper   Continuous Infusions: . sodium chloride 10 mL/hr at 07/16/17 1854  . ceFEPime (MAXIPIME) IV Stopped (07/17/17 0201)     LOS: 0 days    Time spent: [place], MD [facility]   If 7PM-7AM, please contact night-coverage [email or web address] Password [facility] 07/17/2017, 7:43 AM

## 2017-07-18 LAB — GLUCOSE, CAPILLARY
GLUCOSE-CAPILLARY: 115 mg/dL — AB (ref 65–99)
Glucose-Capillary: 101 mg/dL — ABNORMAL HIGH (ref 65–99)
Glucose-Capillary: 155 mg/dL — ABNORMAL HIGH (ref 65–99)

## 2017-07-18 LAB — URINE CULTURE
Culture: >=100000 — AB
Culture: >=100000 — AB

## 2017-07-18 MED ORDER — B COMPLEX-C PO TABS
1.0000 | ORAL_TABLET | Freq: Every day | ORAL | Status: DC
  Administered 2017-07-18 – 2017-07-22 (×5): 1 via ORAL
  Filled 2017-07-18 (×5): qty 1

## 2017-07-18 MED ORDER — FLUTICASONE PROPIONATE 50 MCG/ACT NA SUSP
1.0000 | Freq: Every day | NASAL | Status: DC
  Administered 2017-07-18 – 2017-07-22 (×5): 1 via NASAL
  Filled 2017-07-18: qty 16

## 2017-07-18 MED ORDER — ORAL CARE MOUTH RINSE
15.0000 mL | Freq: Two times a day (BID) | OROMUCOSAL | Status: DC
  Administered 2017-07-18 – 2017-07-22 (×8): 15 mL via OROMUCOSAL

## 2017-07-18 MED ORDER — GERHARDT'S BUTT CREAM
TOPICAL_CREAM | Freq: Every day | CUTANEOUS | Status: DC
  Administered 2017-07-18: 11:00:00 via TOPICAL
  Filled 2017-07-18: qty 1

## 2017-07-18 MED ORDER — B COMPLEX PO TABS: 1.0000 | ORAL_TABLET | Freq: Every day | ORAL | Status: DC

## 2017-07-18 NOTE — Progress Notes (Signed)
Hospitalist progress note   Juan [name]  TLX:[id number] DOB: 1940-06-27 DOA: 07/16/2017 PCP: Colon Branch, MD   Specialists:   Brief Narrative:  77 year old male  Pulmonary hypertension-severe, chronic diastolic heart failure based on echo 09/2016 EF 55-60 Diabetes mellitus type 2 diet-controlled OSA on CPAP CKD 3 Myelofibrosis followed by Dr. Marin Olp on conservative management Rectal bleed 05/2017-sigmoidoscopy = diverticulosis Rx acute Pseudomonas with pyelonephritis and PICC line 04/2017 Gout Sleep apnea  Stopped antibiotics and started having symptoms of dysuria increased frequency of urination found to have abnormal UA andrecent admission 2/19-2/23--was started on cefepime in the ED secondary to Pseudomonas on prior growth White count on admission was 16, BUN/creatinine 52/1.1 which is about his baseline, CXR cardiomegaly with vascular congestion   Assessment & Plan:   Assessment:  The primary encounter diagnosis was Urinary tract infection without hematuria, site unspecified. A diagnosis of Influenza-like illness was also pertinent to this visit.  Recurrent urinary tract infection-sepsis-follow urine culture, continue cefepime for now and monitor--growing Pseudomonas once again - Continue IV antibiotics for now and await sensitivity LUTS adding MIrabegron as suspect that the patient does have some retention of urine Severe pulmonary hypertension with chronic diastolic heart failure EF 55%-continue Lasix 80 a.m., 40 p.m., monitor respiratory symptoms Diabetes mellitus type 2-diet controlled-sugars 101--130 OSA on CPAP-we will reorder CPAP  at home settings-is on oxygen at home for chronic respiratory failure 2 L continuously Chronic kidney disease stage III-BUN/creatinine is stable -previously on metolazone which is not ordered at this time--rpt labs in am Myelofibrosis on conservative management-needs monitoring as an outpatient-continue ferrous gluconate 240 daily BPH continue Flomax 1  capsule nightly Previous rectal bleed 05/2017 status 4 units of blood transfusion-only on aspirin Gout-not on any medications at this time monitor    DVT prophylaxis: lovenox  Code Status:   full   Family Communication:    none  Disposition Plan: home when stabilized   Consultants:   none  Procedures:   none  Antimicrobials:    cefepime   Subjective:   Awake alert well in nad Wants multiple non specific meds No pain Some discomfort still with urination  Objective: Vitals:   07/17/17 1203 07/17/17 1528 07/17/17 2100 07/18/17 0410  BP: (!) 147/64 138/63 (!) 156/81 (!) 152/68  Pulse:  61 74 77  Resp:  16 18 18   Temp:  97.6 F (36.4 C) 98.5 F (36.9 C) 98.3 F (36.8 C)  TempSrc:  Oral Oral   SpO2:  100% 97% 93%  Weight:      Height:        Intake/Output Summary (Last 24 hours) at 07/18/2017 1010 Last data filed at 07/18/2017 0955 Gross per 24 hour  Intake 740 ml  Output 1900 ml  Net -1160 ml   Filed Weights   07/16/17 0303 07/16/17 1522  Weight: 80.3 kg (177 lb) 86.5 kg (190 lb 11.2 oz)    Examination:  Awake alert pleasant oriented EOMI NCAT S1-S2 no murmur rub or gallop Abdomen soft nontender no rebound no guarding condom catheter in place No lower extremity edema Neurologically intact Power 5/5 Chest is clinically clear    Data Reviewed: I have personally reviewed following labs and imaging studies  CBC: Recent Labs  Lab 07/16/17 0558 07/17/17 0312  WBC 16.4* 13.8*  NEUTROABS 10.1*  --   HGB 9.0* 8.6*  HCT 30.0* 29.0*  MCV 89.6 90.9  PLT 388 741   Basic Metabolic Panel: Recent Labs  Lab 07/16/17 0558 07/17/17 0312  NA  138 139  K 4.0 4.3  CL 105 105  CO2 23 25  GLUCOSE 110* 100*  BUN 56* 52*  CREATININE 1.03 1.12  CALCIUM 9.9 9.6   GFR: Estimated Creatinine Clearance: 68.1 mL/min (by C-G formula based on SCr of 1.12 mg/dL). Liver Function Tests: No results for input(s): AST, ALT, ALKPHOS, BILITOT, PROT, ALBUMIN in the last  168 hours. No results for input(s): LIPASE, AMYLASE in the last 168 hours. No results for input(s): AMMONIA in the last 168 hours. Coagulation Profile: No results for input(s): INR, PROTIME in the last 168 hours. Cardiac Enzymes: Recent Labs  Lab 07/16/17 0558  TROPONINI <0.03   CBG: Recent Labs  Lab 07/17/17 0740 07/17/17 1729 07/17/17 1740 07/17/17 2109 07/18/17 0804  GLUCAP 100* 98 94 130* 101*   Urine analysis:    Component Value Date/Time   COLORURINE YELLOW 07/16/2017 0458   APPEARANCEUR TURBID (A) 07/16/2017 0458   LABSPEC 1.011 07/16/2017 0458   PHURINE 6.0 07/16/2017 0458   GLUCOSEU NEGATIVE 07/16/2017 0458   GLUCOSEU NEGATIVE 05/02/2015 1008   HGBUR SMALL (A) 07/16/2017 0458   BILIRUBINUR NEGATIVE 07/16/2017 0458   KETONESUR NEGATIVE 07/16/2017 0458   PROTEINUR 100 (A) 07/16/2017 0458   UROBILINOGEN 0.2 05/02/2015 1008   NITRITE POSITIVE (A) 07/16/2017 0458   LEUKOCYTESUR LARGE (A) 07/16/2017 0458     Radiology Studies: Reviewed images personally in health database    Scheduled Meds: . aspirin EC  81 mg Oral Daily  . enoxaparin (LOVENOX) injection  40 mg Subcutaneous Q24H  . ferrous gluconate  324 mg Oral Daily  . furosemide  40 mg Oral QPM  . furosemide  80 mg Oral Daily  . mirabegron ER  25 mg Oral Daily  . tamsulosin  0.4 mg Oral QPC supper   Continuous Infusions: . ceFEPime (MAXIPIME) IV Stopped (07/18/17 0939)     LOS: 1 day    Time spent: [place], MD [facility]   If 7PM-7AM, please contact night-coverage [email or web address] Password [email or web address] 07/18/2017, 10:10 AM

## 2017-07-19 ENCOUNTER — Encounter: Payer: Self-pay | Admitting: Family Medicine

## 2017-07-19 ENCOUNTER — Encounter (HOSPITAL_COMMUNITY): Payer: Self-pay

## 2017-07-19 LAB — RENAL FUNCTION PANEL
ANION GAP: 9 (ref 5–15)
Albumin: 2.5 g/dL — ABNORMAL LOW (ref 3.5–5.0)
BUN: 53 mg/dL — ABNORMAL HIGH (ref 6–20)
CALCIUM: 9.6 mg/dL (ref 8.9–10.3)
CO2: 26 mmol/L (ref 22–32)
Chloride: 105 mmol/L (ref 101–111)
Creatinine, Ser: 1.15 mg/dL (ref 0.61–1.24)
Glucose, Bld: 135 mg/dL — ABNORMAL HIGH (ref 65–99)
PHOSPHORUS: 3.4 mg/dL (ref 2.5–4.6)
Potassium: 4.1 mmol/L (ref 3.5–5.1)
SODIUM: 140 mmol/L (ref 135–145)

## 2017-07-19 LAB — CBC WITH DIFFERENTIAL/PLATELET
BASOS ABS: 0.5 10*3/uL — AB (ref 0.0–0.1)
BASOS PCT: 4 %
EOS ABS: 0.3 10*3/uL (ref 0.0–0.7)
Eosinophils Relative: 2 %
HCT: 28.5 % — ABNORMAL LOW (ref 39.0–52.0)
Hemoglobin: 8.6 g/dL — ABNORMAL LOW (ref 13.0–17.0)
LYMPHS PCT: 20 %
Lymphs Abs: 2.6 10*3/uL (ref 0.7–4.0)
MCH: 28 pg (ref 26.0–34.0)
MCHC: 30.2 g/dL (ref 30.0–36.0)
MCV: 92.8 fL (ref 78.0–100.0)
Monocytes Absolute: 1.4 10*3/uL — ABNORMAL HIGH (ref 0.1–1.0)
Monocytes Relative: 11 %
NEUTROS PCT: 63 %
Neutro Abs: 8.2 10*3/uL — ABNORMAL HIGH (ref 1.7–7.7)
PLATELETS: 352 10*3/uL (ref 150–400)
RBC: 3.07 MIL/uL — AB (ref 4.22–5.81)
RDW: 17.3 % — ABNORMAL HIGH (ref 11.5–15.5)
WBC: 13 10*3/uL — AB (ref 4.0–10.5)

## 2017-07-19 LAB — GLUCOSE, CAPILLARY
GLUCOSE-CAPILLARY: 95 mg/dL (ref 65–99)
GLUCOSE-CAPILLARY: 96 mg/dL (ref 65–99)
GLUCOSE-CAPILLARY: 102 mg/dL — AB (ref 65–99)
GLUCOSE-CAPILLARY: 127 mg/dL — AB (ref 65–99)

## 2017-07-19 MED ORDER — SODIUM CHLORIDE 0.9 % IV SOLN
1.0000 g | Freq: Three times a day (TID) | INTRAVENOUS | Status: AC
  Administered 2017-07-19 – 2017-07-22 (×11): 1 g via INTRAVENOUS
  Filled 2017-07-19 (×11): qty 1

## 2017-07-19 MED ORDER — DEXTROMETHORPHAN POLISTIREX ER 30 MG/5ML PO SUER
15.0000 mg | Freq: Every day | ORAL | Status: DC
  Administered 2017-07-19: 15 mg via ORAL
  Filled 2017-07-19: qty 5

## 2017-07-19 MED ORDER — CEPHALEXIN 500 MG PO CAPS: 500.0000 mg | ORAL_CAPSULE | Freq: Two times a day (BID) | ORAL | Status: DC

## 2017-07-19 NOTE — Progress Notes (Signed)
Chaplain saw Juan [name] for the first time of this stay on Friday, March 1 between 1515 - 1630. Prayer is important in the life of Juan [name]. From my listening, I experience Juan [name] as a person who carries a great deal of anxiety about paying bills, and attending to the matters that ensure food and shelter. I have asked about his family support and significant relationships. He has been guarded in at times evasive in response.

## 2017-07-19 NOTE — Social Work (Signed)
Per ED CSW note, pt is not interested in SNF placement or HH. Has strong family support.   CSW signing off. Please consult if any additional needs arise.  [place], [place] Work [phone]

## 2017-07-19 NOTE — Progress Notes (Signed)
Hospitalist progress note   Juan [name]  BLT:[id number] DOB: [birth year] DOA: 07/16/2017 PCP: Colon Branch, MD   Specialists:   Brief Narrative:  77 year old male  Pulmonary hypertension-severe, chronic diastolic heart failure based on echo 09/2016 EF 55-60 Diabetes mellitus type 2 diet-controlled OSA on CPAP CKD 3 Myelofibrosis followed by Dr. Marin Olp on conservative management Rectal bleed 05/2017-sigmoidoscopy = diverticulosis Rx acute Pseudomonas with pyelonephritis and PICC line 04/2017 Gout Sleep apnea  Stopped antibiotics and started having symptoms of dysuria increased frequency of urination found to have abnormal UA andrecent admission 2/19-2/23--was started on cefepime in the ED secondary to Pseudomonas on prior growth White count on admission was 16, BUN/creatinine 52/1.1 which is about his baseline, CXR cardiomegaly with vascular congestion   Assessment & Plan:   Assessment:  The primary encounter diagnosis was Urinary tract infection without hematuria, site unspecified. A diagnosis of Influenza-like illness was also pertinent to this visit.  Recurrent urinary tract infection-sepsis-follow urine culture, continue cefepime for now and monitor--growing Pseudomonas once again - Continue IV antibiotics--all resistant-no oral equiv-d/w Dr. Kevin Fenton at least 7 days-no home resources--asking CM to be involved  LUTS adding MIrabegron as suspect that the patient does have some retention of urine  Severe pulmonary hypertension with chronic diastolic heart failure EF 55%-continue Lasix 80 a.m., 40 p.m., monitor respiratory symptoms  Diabetes mellitus type 2-diet controlled-sugars 95-155  OSA on CPAP-we will reorder CPAP  at home settings-is on oxygen at home for chronic respiratory failure 2 L continuously  Chronic kidney disease stage III-BUN/creatinine is stable -previously on metolazone which is not ordered at this time--rpt labs in am  Myelofibrosis on conservative  management-needs monitoring as an outpatient-continue ferrous gluconate 240 daily  BPH continue Flomax 1 capsule nightly  Previous rectal bleed 05/2017 status 4 units of blood transfusion-only on aspirin  Gout-not on any medications at this time monitor    DVT prophylaxis: lovenox  Code Status:   full   Family Communication:    none  Disposition Plan: home when stabilized   Consultants:   none  Procedures:   none  Antimicrobials:    cefepime   Subjective:   Multiple issues States family cannot take care while at home Asking for cough med  Objective: Vitals:   07/18/17 0410 07/18/17 1355 07/18/17 1949 07/19/17 0529  BP: (!) 152/68 (!) 148/67 (!) 146/66 (!) 148/66  Pulse: 77 80 78 79  Resp: 18 18 18 17   Temp: 98.3 F (36.8 C) 98.7 F (37.1 C) 98.7 F (37.1 C) 98.3 F (36.8 C)  TempSrc:  Oral    SpO2: 93% 100% 98% 99%  Weight:      Height:        Intake/Output Summary (Last 24 hours) at 07/19/2017 1017 Last data filed at 07/19/2017 0855 Gross per 24 hour  Intake 1020 ml  Output 2925 ml  Net -1905 ml   Filed Weights   07/16/17 0303 07/16/17 1522  Weight: 80.3 kg (177 lb) 86.5 kg (190 lb 11.2 oz)    Examination:   oriented EOMI NCAT S1-S2 no murmur rub or gallop Abdomen soft nontender no rebound no guarding  No lower extremity edema Neurologically intact Power 5/5 Chest is clinically clear    Data Reviewed: I have personally reviewed following labs and imaging studies  CBC: Recent Labs  Lab 07/16/17 0558 07/17/17 0312  WBC 16.4* 13.8*  NEUTROABS 10.1*  --   HGB 9.0* 8.6*  HCT 30.0* 29.0*  MCV 89.6 90.9  PLT 388 362  Basic Metabolic Panel: Recent Labs  Lab 07/16/17 0558 07/17/17 0312  NA 138 139  K 4.0 4.3  CL 105 105  CO2 23 25  GLUCOSE 110* 100*  BUN 56* 52*  CREATININE 1.03 1.12  CALCIUM 9.9 9.6   GFR: Estimated Creatinine Clearance: 68.1 mL/min (by C-G formula based on SCr of 1.12 mg/dL). Liver Function Tests: No  results for input(s): AST, ALT, ALKPHOS, BILITOT, PROT, ALBUMIN in the last 168 hours. No results for input(s): LIPASE, AMYLASE in the last 168 hours. No results for input(s): AMMONIA in the last 168 hours. Coagulation Profile: No results for input(s): INR, PROTIME in the last 168 hours. Cardiac Enzymes: Recent Labs  Lab 07/16/17 0558  TROPONINI <0.03   CBG: Recent Labs  Lab 07/17/17 2109 07/18/17 0804 07/18/17 1707 07/18/17 2134 07/19/17 0823  GLUCAP 130* 101* 115* 155* 95   Urine analysis:    Component Value Date/Time   COLORURINE YELLOW 07/16/2017 0458   APPEARANCEUR TURBID (A) 07/16/2017 0458   LABSPEC 1.011 07/16/2017 0458   PHURINE 6.0 07/16/2017 0458   GLUCOSEU NEGATIVE 07/16/2017 0458   GLUCOSEU NEGATIVE 05/02/2015 1008   HGBUR SMALL (A) 07/16/2017 0458   BILIRUBINUR NEGATIVE 07/16/2017 0458   KETONESUR NEGATIVE 07/16/2017 0458   PROTEINUR 100 (A) 07/16/2017 0458   UROBILINOGEN 0.2 05/02/2015 1008   NITRITE POSITIVE (A) 07/16/2017 0458   LEUKOCYTESUR LARGE (A) 07/16/2017 0458     Radiology Studies: Reviewed images personally in health database    Scheduled Meds: . aspirin EC  81 mg Oral Daily  . B-complex with vitamin C  1 tablet Oral Daily  . dextromethorphan  15 mg Oral QHS  . enoxaparin (LOVENOX) injection  40 mg Subcutaneous Q24H  . ferrous gluconate  324 mg Oral Daily  . fluticasone  1 spray Each Nare Daily  . furosemide  40 mg Oral QPM  . furosemide  80 mg Oral Daily  . Gerhardt's butt cream   Topical Daily  . mouth rinse  15 mL Mouth Rinse BID  . mirabegron ER  25 mg Oral Daily  . tamsulosin  0.4 mg Oral QPC supper   Continuous Infusions: . ceFEPime (MAXIPIME) IV 1 g (07/19/17 1005)     LOS: 2 days    Time spent: [place], MD [facility]   If 7PM-7AM, please contact night-coverage [email or web address] Password [email or web address] 07/19/2017, 10:17 AM

## 2017-07-19 NOTE — Progress Notes (Signed)
PT Cancellation Note  Patient Details Name: Juan [name] MRN: [id number] DOB: 1940/08/27   Cancelled Treatment:    Reason Eval/Treat Not Completed: Patient declined, no reason specified Pt reporting he does not feel well and wants to wait until tomorrow to work with PT. Will follow up as schedule allows.   [name], PT, DPT  Acute Rehabilitation Services  Pager: [phone]  [name] 07/19/2017, 3:17 PM

## 2017-07-19 NOTE — Progress Notes (Signed)
ANTIBIOTIC CONSULT NOTE   Pharmacy Consult for Cefepime Indication: PSA UTI  No Known Allergies  Patient Measurements: Height: 6\' 3"  (190.5 cm) Weight: 190 lb 11.2 oz (86.5 kg) IBW/kg (Calculated) : 84.5 Adjusted Body Weight:    Vital Signs: Temp: 98.3 F (36.8 C) (03/04 0529) BP: 148/66 (03/04 0529) Pulse Rate: 79 (03/04 0529) Intake/Output from previous day: 03/03 0701 - 03/04 0700 In: 1020 [P.O.:1020] Out: 2925 [Urine:2925] Intake/Output from this shift: No intake/output data recorded.  Labs: Recent Labs    07/17/17 0312  WBC 13.8*  HGB 8.6*  PLT 362  CREATININE 1.12   Estimated Creatinine Clearance: 68.1 mL/min (by C-G formula based on SCr of 1.12 mg/dL). No results for input(s): VANCOTROUGH, VANCOPEAK, VANCORANDOM, GENTTROUGH, GENTPEAK, GENTRANDOM, TOBRATROUGH, TOBRAPEAK, TOBRARND, AMIKACINPEAK, AMIKACINTROU, AMIKACIN in the last 72 hours.   Microbiology:   Medical History: Past Medical History:  Diagnosis Date  . Allergic rhinitis   . Anemia   . Ascending aortic aneurysm ([place]) 03/2014   4.3cm on CT scan  . CAD (coronary artery disease)    dx elsewheer in past, no documentation. Non-ischemic myoview 2007  . Chronic diastolic CHF (congestive heart failure), NYHA class 2 (HCC)    Normal EF w/ grade 1 dd by echo 12/2015  . Edema    R>L leg, u/s 5-12 neg for DVT  . Hemorrhoid   . History of thrombocytosis   . Hypertension   . Iron deficiency anemia due to chronic blood loss 03/31/2017  . 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 Migraine    "once/wk at least" (07/11/2013)  . Myeloproliferative disease ([place])   . On home oxygen therapy    "2L; all the time" (05/03/2017)  . Pyelonephritis 04/29/2017  . Shortness of breath   . Sinus congestion   . Sleep apnea, obstructive    at some point used CPAP, was d/c  years ago  . Type II diabetes mellitus ([place])   . UTI (urinary tract infection) 06/2017    Assessment: ID: [id number] for pseudomonas UTI. Afebrile, WBC 13.8.  Last Scr 1.12. Labs pending today - acute Pseudomonas with pyelonephritis and PICC line 04/2017  Cefepime 3/1 >>  3/1 UCx x 2 - pseudomonas (R-cipro)  Goal of Therapy:  Eradication of infection  Plan:  Continue cefepime 1gm IV Q8H  Canda Podgorski S. Alford [place], PharmD, BCPS Clinical Staff Pharmacist Pager [phone]  Eilene Ghazi [place] 07/19/2017,8:43 AM

## 2017-07-19 NOTE — Progress Notes (Signed)
Chaplain came to Pt.'s room on rounds and by way of patient requesting prayer.  Patient was initially sleeping at the time of visit. Juan [name] stirred greeted me and I told him to continue to try and rest. We said the Lord's Prayer together and he drifted back to sleep. I will follow up with Juan [name]. He has reported in the past that he enjoy's visits from the chaplain.

## 2017-07-19 NOTE — Care Management Note (Signed)
Case Management Note  Patient Details  Name: Juan [name] MRN: [id number] Date of Birth: Dec 31, 1940  Subjective/Objective:                    Action/Plan:  Spoke to patient at bedside regarding home health vs SNF. Patient refusing both. Patient lives with son and already has home oxygen  Expected Discharge Date:  07/19/17               Expected Discharge Plan:  Home/Self Care  In-House Referral:     Discharge planning Services  CM Consult  Post Acute Care Choice:  Home Health Choice offered to:  Patient  DME Arranged:  N/A DME Agency:  NA  HH Arranged:  Refused SNF, Patient Refused [facility] Agency:  NA  Status of Service:  Completed, signed off  If discussed at [employer] of Stay Meetings, dates discussed:    Additional Comments:  Marilu Favre, RN 07/19/2017, 10:41 AM

## 2017-07-20 ENCOUNTER — Encounter: Payer: Self-pay | Admitting: Internal Medicine

## 2017-07-20 LAB — CBC WITH DIFFERENTIAL/PLATELET
Basophils Absolute: 0.7 10*3/uL — ABNORMAL HIGH (ref 0.0–0.1)
Basophils Relative: 4 %
EOS ABS: 0.3 10*3/uL (ref 0.0–0.7)
Eosinophils Relative: 2 %
HCT: 31 % — ABNORMAL LOW (ref 39.0–52.0)
HEMOGLOBIN: 9.4 g/dL — AB (ref 13.0–17.0)
LYMPHS ABS: 2.7 10*3/uL (ref 0.7–4.0)
Lymphocytes Relative: 16 %
MCH: 28 pg (ref 26.0–34.0)
MCHC: 30.3 g/dL (ref 30.0–36.0)
MCV: 92.3 fL (ref 78.0–100.0)
MONO ABS: 3 10*3/uL — AB (ref 0.1–1.0)
Monocytes Relative: 18 %
NEUTROS ABS: 9.9 10*3/uL — AB (ref 1.7–7.7)
Neutrophils Relative %: 60 %
Platelets: 356 10*3/uL (ref 150–400)
RBC: 3.36 MIL/uL — ABNORMAL LOW (ref 4.22–5.81)
RDW: 17.3 % — AB (ref 11.5–15.5)
WBC Morphology: INCREASED
WBC: 16.6 10*3/uL — ABNORMAL HIGH (ref 4.0–10.5)

## 2017-07-20 LAB — RENAL FUNCTION PANEL
ALBUMIN: 2.7 g/dL — AB (ref 3.5–5.0)
Anion gap: 8 (ref 5–15)
BUN: 50 mg/dL — AB (ref 6–20)
CALCIUM: 9.8 mg/dL (ref 8.9–10.3)
CO2: 26 mmol/L (ref 22–32)
CREATININE: 1.16 mg/dL (ref 0.61–1.24)
Chloride: 107 mmol/L (ref 101–111)
GFR calc Af Amer: >60 mL/min (ref >60)
GFR, EST NON AFRICAN AMERICAN: 60 mL/min — AB (ref >60)
GLUCOSE: 112 mg/dL — AB (ref 65–99)
PHOSPHORUS: 3.4 mg/dL (ref 2.5–4.6)
Potassium: 4.1 mmol/L (ref 3.5–5.1)
SODIUM: 141 mmol/L (ref 135–145)

## 2017-07-20 LAB — GLUCOSE, CAPILLARY
GLUCOSE-CAPILLARY: 108 mg/dL — AB (ref 65–99)
Glucose-Capillary: 129 mg/dL — ABNORMAL HIGH (ref 65–99)
Glucose-Capillary: 88 mg/dL (ref 65–99)
Glucose-Capillary: 88 mg/dL (ref 65–99)

## 2017-07-20 LAB — PATHOLOGIST SMEAR REVIEW

## 2017-07-20 MED ORDER — DEXTROMETHORPHAN POLISTIREX ER 30 MG/5ML PO SUER
15.0000 mg | Freq: Two times a day (BID) | ORAL | Status: DC
  Administered 2017-07-20 – 2017-07-22 (×4): 15 mg via ORAL
  Filled 2017-07-20 (×5): qty 5

## 2017-07-20 NOTE — Progress Notes (Addendum)
PT Cancellation Note  Patient Details Name: Juan [name] MRN: [id number] DOB: Jan 12, 1941   Cancelled Treatment:    Reason Eval/Treat Not Completed: Patient declined, no reason specified.  I woke pt and he did not realize his lunch was there.  He asked me to return after lunch.  Unfortunately, he does tend to refuse therapy when he is here in the hospital (I am familiar with him from multiple prior admissions). I did get report that he walked with mobility tech this AM.  PT will continue efforts to mobilize him as well.  Thanks,    Barbarann Ehlers. Khary Schaben, PT, DPT [phone]   07/20/2017, 1:18 PM   07/20/2017 14:35: I checked back on Juan [name] and he refused stating, "I only walk one time a day" despite being agreeable for me to check back when I stopped by earlier.  PT will check back tomorrow.  Thanks,  Barbarann Ehlers. [place], [place], DPT [phone]

## 2017-07-20 NOTE — Progress Notes (Signed)
Hospitalist progress note   Juan [name]  ZOX:[id number] DOB: [birth year] DOA: 07/16/2017 PCP: Colon Branch, MD   Specialists:   Brief Narrative:  77 year old male  Pulmonary hypertension-severe, chronic diastolic heart failure based on echo 09/2016 EF 55-60 Diabetes mellitus type 2 diet-controlled OSA on CPAP CKD 3 Myelofibrosis followed by Dr. Marin Olp on conservative management Rectal bleed 05/2017-sigmoidoscopy = diverticulosis Rx acute Pseudomonas with pyelonephritis and PICC line 04/2017 Gout Sleep apnea  Stopped antibiotics and started having symptoms of dysuria increased frequency of urination found to have abnormal UA andrecent admission 2/19-2/23--was started on cefepime in the ED secondary to Pseudomonas on prior growth White count on admission was 16, BUN/creatinine 52/1.1 which is about his baseline, CXR cardiomegaly with vascular congestion   Assessment & Plan:   Assessment:  The primary encounter diagnosis was Urinary tract infection without hematuria, site unspecified. A diagnosis of Influenza-like illness was also pertinent to this visit.  Recurrent urinary tract infection-sepsis-follow urine culture, continue cefepime for now and monitor--growing Pseudomonas once again - Continue IV antibiotics--all resistant-no oral equiv-d/w Dr. Kevin Fenton at least 7 days-no home resource [son cannot complete or help at home] Will need to finish IV abx prior to d/c home on 07/22/17  LUTS adding MIrabegron as suspect that the patient does have some retention of urine  Severe pulmonary hypertension with chronic diastolic heart failure EF 55%-continue Lasix 80 a.m., 40 p.m., monitor respiratory symptoms-improved-minus 8 liters since here  Diabetes mellitus type 2-diet controlled-sugars 95-155  OSA on CPAP-we will reorder CPAP  at home settings-is on oxygen at home for chronic respiratory failure 2 L continuously  Chronic kidney disease stage III-BUN/creatinine is stable -previously on  metolazone which is not ordered at this time--stable  Myelofibrosis on conservative management-needs monitoring as an outpatient-continue ferrous gluconate 240 daily  BPH continue Flomax 1 capsule nightly  Previous rectal bleed 05/2017 status 4 units of blood transfusion-only on aspirin  Gout-not on any medications at this time monitor    DVT prophylaxis: lovenox  Code Status:   full   Family Communication:    none  Disposition Plan: home when stabilized   Consultants:   none  Procedures:   none  Antimicrobials:    cefepime   Subjective:  Fair coughing still asking for increase in cough meds No cp no n/v  Objective: Vitals:   07/19/17 0529 07/19/17 1327 07/19/17 2143 07/20/17 0446  BP: (!) 148/66 (!) 154/70 (!) 150/74 139/61  Pulse: 79 77 68 65  Resp: 17 16 16 16   Temp: 98.3 F (36.8 C) 98.6 F (37 C) 98.4 F (36.9 C) 98.3 F (36.8 C)  TempSrc:  Oral Oral Oral  SpO2: 99% 97% 99% 98%  Weight:      Height:        Intake/Output Summary (Last 24 hours) at 07/20/2017 1303 Last data filed at 07/20/2017 0515 Gross per 24 hour  Intake 720 ml  Output 2350 ml  Net -1630 ml   Filed Weights   07/16/17 0303 07/16/17 1522  Weight: 80.3 kg (177 lb) 86.5 kg (190 lb 11.2 oz)    Examination:   oriented EOMI NCAT S1-S2 no murmur rub or gallop Abdomen soft nontender no rebound no guarding  No lower extremity edema Neurologically intact Power 5/5 Chest is clinically clear    Data Reviewed: I have personally reviewed following labs and imaging studies  CBC: Recent Labs  Lab 07/16/17 0558 07/17/17 0312 07/19/17 0926 07/20/17 0558  WBC 16.4* 13.8* 13.0* 16.6*  NEUTROABS 10.1*  --  8.2* 9.9*  HGB 9.0* 8.6* 8.6* 9.4*  HCT 30.0* 29.0* 28.5* 31.0*  MCV 89.6 90.9 92.8 92.3  PLT 388 362 352 229   Basic Metabolic Panel: Recent Labs  Lab 07/16/17 0558 07/17/17 0312 07/19/17 0926 07/20/17 0558  NA 138 139 140 141  K 4.0 4.3 4.1 4.1  CL 105 105 105 107   CO2 23 25 26 26   GLUCOSE 110* 100* 135* 112*  BUN 56* 52* 53* 50*  CREATININE 1.03 1.12 1.15 1.16  CALCIUM 9.9 9.6 9.6 9.8  PHOS  --   --  3.4 3.4   GFR: Estimated Creatinine Clearance: 65.8 mL/min (by C-G formula based on SCr of 1.16 mg/dL). Liver Function Tests: Recent Labs  Lab 07/19/17 0926 07/20/17 0558  ALBUMIN 2.5* 2.7*   No results for input(s): LIPASE, AMYLASE in the last 168 hours. No results for input(s): AMMONIA in the last 168 hours. Coagulation Profile: No results for input(s): INR, PROTIME in the last 168 hours. Cardiac Enzymes: Recent Labs  Lab 07/16/17 0558  TROPONINI <0.03   CBG: Recent Labs  Lab 07/19/17 1139 07/19/17 1703 07/19/17 2146 07/20/17 0739 07/20/17 1148  GLUCAP 96 102* 127* 129* 88   Urine analysis:    Component Value Date/Time   COLORURINE YELLOW 07/16/2017 0458   APPEARANCEUR TURBID (A) 07/16/2017 0458   LABSPEC 1.011 07/16/2017 0458   PHURINE 6.0 07/16/2017 0458   GLUCOSEU NEGATIVE 07/16/2017 0458   GLUCOSEU NEGATIVE 05/02/2015 1008   HGBUR SMALL (A) 07/16/2017 0458   BILIRUBINUR NEGATIVE 07/16/2017 0458   KETONESUR NEGATIVE 07/16/2017 0458   PROTEINUR 100 (A) 07/16/2017 0458   UROBILINOGEN 0.2 05/02/2015 1008   NITRITE POSITIVE (A) 07/16/2017 0458   LEUKOCYTESUR LARGE (A) 07/16/2017 0458     Radiology Studies: Reviewed images personally in health database    Scheduled Meds: . aspirin EC  81 mg Oral Daily  . B-complex with vitamin C  1 tablet Oral Daily  . dextromethorphan  15 mg Oral QHS  . enoxaparin (LOVENOX) injection  40 mg Subcutaneous Q24H  . ferrous gluconate  324 mg Oral Daily  . fluticasone  1 spray Each Nare Daily  . furosemide  40 mg Oral QPM  . furosemide  80 mg Oral Daily  . Gerhardt's butt cream   Topical Daily  . mouth rinse  15 mL Mouth Rinse BID  . mirabegron ER  25 mg Oral Daily  . tamsulosin  0.4 mg Oral QPC supper   Continuous Infusions: . ceFEPime (MAXIPIME) IV Stopped (07/20/17 1054)      LOS: 3 days    Time spent: [place], MD [facility]   If 7PM-7AM, please contact night-coverage [email or web address] Password [email or web address] 07/20/2017, 1:03 PM

## 2017-07-21 DIAGNOSIS — I5032 Chronic diastolic (congestive) heart failure: Secondary | ICD-10-CM

## 2017-07-21 DIAGNOSIS — N390 Urinary tract infection, site not specified: Principal | ICD-10-CM

## 2017-07-21 DIAGNOSIS — G4733 Obstructive sleep apnea (adult) (pediatric): Secondary | ICD-10-CM

## 2017-07-21 LAB — GLUCOSE, CAPILLARY
GLUCOSE-CAPILLARY: 90 mg/dL (ref 65–99)
Glucose-Capillary: 102 mg/dL — ABNORMAL HIGH (ref 65–99)
Glucose-Capillary: 121 mg/dL — ABNORMAL HIGH (ref 65–99)
Glucose-Capillary: 100 mg/dL — ABNORMAL HIGH (ref 65–99)

## 2017-07-21 NOTE — Evaluation (Signed)
Physical Therapy Evaluation Patient Details Name: Juan [name] MRN: [id number] DOB: 08-20-1940 Today's Date: 07/21/2017   History of Present Illness  77 year old male with PMH of CAD, chronic diastolic CHF, HTN, DM, OSA not on CPAP, chronic hypoxic respiratory failure on 2 L/min oxygen, severe pulmonary hypertension, AAA, MDS, iron deficiency anemia, recent hospitalization 2/19-2/23 for decompensated CHF, UTI, adamantly refused SNF at discharge, presented with dysuria, urinary frequency and admitted for recurrent UTI.  Clinical Impression  Pt admitted with above diagnosis. Pt currently with functional limitations due to the deficits listed below (see PT Problem List). Tending to refuse SNF or HHPT, but it is worth considering Home Instead Program through [facility];  Pt will benefit from skilled PT to increase their independence and safety with mobility to allow discharge to the venue listed below.       Follow Up Recommendations Home health PT;Supervision/Assistance - 24 hour(pt refusing SNF; most likely declining HH)    Equipment Recommendations  None recommended by PT    Recommendations for Other Services       Precautions / Restrictions Precautions Precautions: Fall      Mobility  Bed Mobility Overal bed mobility: Needs Assistance Bed Mobility: Supine to Sit;Sit to Supine     Supine to sit: Min assist Sit to supine: Min assist   General bed mobility comments: Encouraged more independence, and pt was willing to do more for himself with bed mobiltiy; used rails  Transfers Overall transfer level: Needs assistance Equipment used: Rolling walker (2 wheeled) Transfers: Sit to/from Stand Sit to Stand: Min guard;From elevated surface         General transfer comment: Pt requesting very elevated bed before standing despite encouragement to attempt from lower surface. Able to do so x3 with RW and min guard for balance  Ambulation/Gait Ambulation/Gait assistance: Min  guard Ambulation Distance (Feet): 80 Feet Assistive device: Rolling walker (2 wheeled) Gait Pattern/deviations: Step-to pattern;Trunk flexed     General Gait Details: Cues to self-monitor for activity tolerance; trunk flexed; offered to raise RW, but pt declined  Stairs            Wheelchair Mobility    Modified Rankin (Stroke Patients Only)       Balance     Sitting balance-Leahy Scale: Fair       Standing balance-Leahy Scale: Poor                               Pertinent Vitals/Pain Pain Assessment: No/denies pain Pain Intervention(s): Monitored during session    Home Living Family/patient expects to be discharged to:: Private residence Living Arrangements: Children Available Help at Discharge: Family;Available 24 hours/day Type of Home: Apartment Home Access: Level entry     Home Layout: One level Home Equipment: Walker - 2 wheels;Cane - single point;Shower seat;Bedside commode;Wheelchair - manual;Hospital bed Additional Comments: pt lives with his son    Prior Function Level of Independence: Needs assistance   Gait / Transfers Assistance Needed: Limited to short amb distances with RW or SPC. Intermittent use of w/c  ADL's / Homemaking Assistance Needed: Takes bird baths at sink. Son assists with ADLs as needed. Family drives him  Comments: pt had wrist surgery some time back and has velcro splint to wear as needed     Hand Dominance   Dominant Hand: Right    Extremity/Trunk Assessment   Upper Extremity Assessment Upper Extremity Assessment: Generalized weakness    Lower  Extremity Assessment Lower Extremity Assessment: Generalized weakness       Communication   Communication: No difficulties  Cognition Arousal/Alertness: Awake/alert Behavior During Therapy: WFL for tasks assessed/performed Overall Cognitive Status: Within Functional Limits for tasks assessed                                 General Comments:  Overall cognitive status appears WFL. Pt generally agitated with PT wanting to help him mobilize (known h/o this with previous admissions)      General Comments      Exercises     Assessment/Plan    PT Assessment Patient needs continued PT services  PT Problem List Decreased strength;Decreased activity tolerance;Decreased balance;Decreased mobility;Decreased knowledge of use of DME;Decreased skin integrity       PT Treatment Interventions DME instruction;Gait training;Stair training;Functional mobility training;Therapeutic activities;Therapeutic exercise;Balance training;Patient/family education;Wheelchair mobility training    PT Goals (Current goals can be found in the Care Plan section)  Acute Rehab PT Goals Patient Stated Goal: Return home and be left alone PT Goal Formulation: With patient Time For Goal Achievement: 08/04/17 Potential to Achieve Goals: Good    Frequency Min 3X/week   Barriers to discharge        Co-evaluation               AM-PAC PT "6 Clicks" Daily Activity  Outcome Measure Difficulty turning over in bed (including adjusting bedclothes, sheets and blankets)?: A Lot Difficulty moving from lying on back to sitting on the side of the bed? : A Little Difficulty sitting down on and standing up from a chair with arms (e.g., wheelchair, bedside commode, etc,.)?: A Little Help needed moving to and from a bed to chair (including a wheelchair)?: A Little Help needed walking in hospital room?: A Little Help needed climbing 3-5 steps with a railing? : A Lot 6 Click Score: 16    End of Session Equipment Utilized During Treatment: Gait belt Activity Tolerance: Patient tolerated treatment well Patient left: in bed;with bed alarm set;with call Dace/phone within reach Nurse Communication: Mobility status PT Visit Diagnosis: Other abnormalities of gait and mobility (R26.89);Muscle weakness (generalized) (M62.81)    Time: 4287-6811 PT Time Calculation  (min) (ACUTE ONLY): 27 min   Charges:   PT Evaluation $PT Eval Moderate Complexity: 1 Mod PT Treatments $Gait Training: 8-22 mins   PT G Codes:        Roney Marion, PT  Acute Rehabilitation Services Pager [phone] Office [phone]   Colletta [place] 07/21/2017, 4:36 PM

## 2017-07-21 NOTE — Progress Notes (Signed)
PROGRESS NOTE   Juan [name]  OVF:[id number]    DOB: [birth year]    DOA: 07/16/2017  PCP: Colon Branch, MD   I have briefly reviewed patients previous medical records in [facility].  Brief Narrative:  77 year old male with PMH of CAD, chronic diastolic CHF, HTN, DM, OSA not on CPAP, chronic hypoxic respiratory failure on 2 L/min oxygen, severe pulmonary hypertension, AAA, MDS, iron deficiency anemia, recent hospitalization 2/19-2/23 for decompensated CHF, UTI, adamantly refused SNF at discharge, presented with dysuria, urinary frequency and admitted for recurrent UTI.   Assessment & Plan:   Principal Problem:   Recurrent UTI Active Problems:   Myeloproliferative disease ([place])   Obstructive sleep apnea   Chronic diastolic CHF (congestive heart failure) (HCC)   Morbid obesity due to excess calories (HCC)   CKD (chronic kidney disease) stage 3, GFR 30-59 ml/min (HCC)   Type II diabetes mellitus (HCC)   Iron deficiency anemia due to chronic blood loss   Pyelonephritis   Recurrent UTI: Possibly complicating underlying BPH.  Urine culture 07/16/17 showed >100 K colonies per mL pseudomonas aeruginosa, resistant to Cipro.  No oral choices available.  As per Dr. Arlyss Queen discussion with Dr. Linus Salmons, ID, complete course of IV cefepime on 07/22/17 prior to discharge.  Patient started on Myrbetriq for LUTS.  Renal ultrasound results from 05/06/17 appreciated.  Consider outpatient Urology consultation and follow-up.  Severe pulmonary hypertension/chronic diastolic CHF: Clinically appears compensated.  Continue home dose of furosemide 40 mg daily.  Chronic hypoxic respiratory failure: Back on home oxygen 2 L/min and saturating at 100%.  Type II DM: Good inpatient control.  Not on medications at this time.  OSA: Not sure if he was compliant with CPAP at home.  CPAP ordered here.  Essential hypertension: Controlled.  Stage II chronic kidney disease: Creatinine stable/normal.  Normocytic  anemia/leukocytosis/MDS: Hemoglobin stable.  Mild chronic leukocytosis.  Outpatient follow-up.  BPH: Continue Flomax.     DVT prophylaxis: Lovenox Code Status: Full Family Communication: None at bedside Disposition: DC home after completion of IV antibiotics on 07/22/17 with home health.  Patient declines SNF and has done so on previous hospitalization as well.   Consultants:  None  Procedures:  None  Antimicrobials:  IV cefepime   Subjective: Denies complaints.  No dysuria, urinary frequency or pain reported.  Denies dyspnea or chest pain.  ROS: As above  Objective:  Vitals:   07/20/17 1344 07/20/17 2209 07/21/17 0615 07/21/17 1343  BP: (!) 152/64 (!) 143/70 (!) 142/72 (!) 151/72  Pulse: 70 82 88 69  Resp: 16 16 15 18   Temp: 98.5 F (36.9 C) 98.4 F (36.9 C) 98.7 F (37.1 C) 97.7 F (36.5 C)  TempSrc: Oral Oral Oral Oral  SpO2: 100% 100% 100% 100%  Weight:      Height:        Examination:  General exam: Pleasant elderly male, moderately built and nourished, lying comfortably propped up in bed. Respiratory system: Clear to auscultation. Respiratory effort normal. Cardiovascular system: S1 & S2 heard, RRR. No JVD, murmurs, rubs, gallops or clicks. No pedal edema. Gastrointestinal system: Abdomen is nondistended, soft and nontender. No organomegaly or masses felt. Normal bowel sounds heard. Central nervous system: Alert and oriented. No focal neurological deficits. Extremities: Symmetric 5 x 5 power. Skin: No rashes, lesions or ulcers Psychiatry: Judgement and insight appear impaired. Mood & affect appropriate.     Data Reviewed: I have personally reviewed following labs and imaging studies  CBC: Recent Labs  Lab 07/16/17 0558 07/17/17 0312 07/19/17 0926 07/20/17 0558  WBC 16.4* 13.8* 13.0* 16.6*  NEUTROABS 10.1*  --  8.2* 9.9*  HGB 9.0* 8.6* 8.6* 9.4*  HCT 30.0* 29.0* 28.5* 31.0*  MCV 89.6 90.9 92.8 92.3  PLT 388 362 352 637   Basic Metabolic  Panel: Recent Labs  Lab 07/16/17 0558 07/17/17 0312 07/19/17 0926 07/20/17 0558  NA 138 139 140 141  K 4.0 4.3 4.1 4.1  CL 105 105 105 107  CO2 23 25 26 26   GLUCOSE 110* 100* 135* 112*  BUN 56* 52* 53* 50*  CREATININE 1.03 1.12 1.15 1.16  CALCIUM 9.9 9.6 9.6 9.8  PHOS  --   --  3.4 3.4   Liver Function Tests: Recent Labs  Lab 07/19/17 0926 07/20/17 0558  ALBUMIN 2.5* 2.7*   Cardiac Enzymes: Recent Labs  Lab 07/16/17 0558  TROPONINI <0.03   CBG: Recent Labs  Lab 07/20/17 1148 07/20/17 1720 07/20/17 2205 07/21/17 0744 07/21/17 1137  GLUCAP 88 88 108* 102* 90    Recent Results (from the past 240 hour(s))  Urine Culture     Status: Abnormal   Collection Time: 07/16/17  6:24 AM  Result Value Ref Range Status   Specimen Description URINE, CLEAN CATCH  Final   Special Requests   Final    NONE Performed at [facility], [place] [address] [place], [place] [address]    Culture >=100,000 COLONIES/mL PSEUDOMONAS AERUGINOSA (A)  Final   Report Status 07/18/2017 FINAL  Final   Organism ID, Bacteria PSEUDOMONAS AERUGINOSA (A)  Final      Susceptibility   Pseudomonas aeruginosa - MIC*    CEFTAZIDIME 4 SENSITIVE Sensitive     CIPROFLOXACIN >=4 RESISTANT Resistant     GENTAMICIN 4 SENSITIVE Sensitive     IMIPENEM 2 SENSITIVE Sensitive     PIP/TAZO 32 SENSITIVE Sensitive     CEFEPIME 8 SENSITIVE Sensitive     * >=100,000 COLONIES/mL PSEUDOMONAS AERUGINOSA  Urine culture     Status: Abnormal   Collection Time: 07/16/17 10:16 AM  Result Value Ref Range Status   Specimen Description URINE, CATHETERIZED  Final   Special Requests   Final    NONE Performed at [facility], [place] [address], [place], [place] [address]    Culture >=100,000 COLONIES/mL PSEUDOMONAS AERUGINOSA (A)  Final   Report Status 07/18/2017 FINAL  Final   Organism ID, Bacteria PSEUDOMONAS AERUGINOSA (A)  Final      Susceptibility   Pseudomonas aeruginosa - MIC*    CEFTAZIDIME 4 SENSITIVE  Sensitive     CIPROFLOXACIN >=4 RESISTANT Resistant     GENTAMICIN 4 SENSITIVE Sensitive     IMIPENEM 2 SENSITIVE Sensitive     PIP/TAZO 32 SENSITIVE Sensitive     CEFEPIME 8 SENSITIVE Sensitive     * >=100,000 COLONIES/mL PSEUDOMONAS AERUGINOSA         Radiology Studies: No results found.      Scheduled Meds: . aspirin EC  81 mg Oral Daily  . B-complex with vitamin C  1 tablet Oral Daily  . dextromethorphan  15 mg Oral BID  . enoxaparin (LOVENOX) injection  40 mg Subcutaneous Q24H  . ferrous gluconate  324 mg Oral Daily  . fluticasone  1 spray Each Nare Daily  . furosemide  40 mg Oral QPM  . furosemide  80 mg Oral Daily  . Gerhardt's butt cream   Topical Daily  . mouth rinse  15 mL Mouth  Rinse BID  . mirabegron ER  25 mg Oral Daily  . tamsulosin  0.4 mg Oral QPC supper   Continuous Infusions: . ceFEPime (MAXIPIME) IV Stopped (07/21/17 1112)     LOS: 4 days     Vernell Leep, MD, FACP, [facility]. [facility] Pager [phone]  If 7PM-7AM, please contact night-coverage [email or web address] Password [facility] 07/21/2017, 3:47 PM

## 2017-07-21 NOTE — Care Management Important Message (Signed)
Important Message  Patient Details  Name: Juan [name] MRN: [id number] Date of Birth: August 15, 1940   Medicare Important Message Given:       Orbie Pyo 07/21/2017, 1:55 PM

## 2017-07-21 NOTE — Care Management Important Message (Signed)
Important Message  Patient Details  Name: Cletis Clack MRN: [id number] Date of Birth: 27-Aug-1940   Medicare Important Message Given:    Patient refused to sign Unsigned copy left   Dhani Dannemiller 07/21/2017, 1:52 PM

## 2017-07-22 DIAGNOSIS — N183 Chronic kidney disease, stage 3 (moderate): Secondary | ICD-10-CM

## 2017-07-22 LAB — GLUCOSE, CAPILLARY
GLUCOSE-CAPILLARY: 89 mg/dL (ref 65–99)
GLUCOSE-CAPILLARY: 96 mg/dL (ref 65–99)
GLUCOSE-CAPILLARY: 89 mg/dL (ref 65–99)

## 2017-07-22 MED ORDER — BENZONATATE 100 MG PO CAPS: 100.0000 mg | ORAL_CAPSULE | Freq: Two times a day (BID) | ORAL | 0 refills | Status: DC | PRN

## 2017-07-22 NOTE — Care Management Note (Addendum)
Case Management Note  Patient Details  Name: Crescencio Jozwiak MRN: [id number] Date of Birth: 19-Nov-1940  Subjective/Objective:                    Action/Plan: 1683 DC not complete as of yet. Ambulance transport papers and phone numbers in shadow chart. Patient has home oxygen. Patient has called his family and they are at home waiting his arrival. Bedside nurse will call transport when patient ready to discharge home.  Discussed home health with patient again at bedside. Patient continues to decline home health. Patient and son live together. Patient aware discharge is today . Patient is requesting ambulance transportation home. He will call family to make sure  someone will be home . He has dose of antibiotic due at 1500 and bedside nurse states discharge will be around 1800 hours. PTAR paper work completed and in shadow chart with PTAR and after hours phone numbers. Will check back with patient and nurse later in day before calling to arrange transport.   Confirmed address with patient. Expected Discharge Date:  07/19/17               Expected Discharge Plan:  Home/Self Care  In-House Referral:     Discharge planning Services  CM Consult  Post Acute Care Choice:  Home Health Choice offered to:  Patient  DME Arranged:  N/A DME Agency:  NA  HH Arranged:  Refused SNF, Patient Refused [facility] Agency:  NA  Status of Service:  Completed, signed off  If discussed at [place] of Stay Meetings, dates discussed:    Additional Comments:  Marilu Favre, RN 07/22/2017, 10:28 AM

## 2017-07-22 NOTE — Discharge Summary (Signed)
Physician Discharge Summary  Juan [name] ZOX:[id number] DOB: 18-Jun-1940  PCP: Colon Branch, MD  Admit date: 07/16/2017 Discharge date: 07/22/2017  Recommendations for Outpatient Follow-up:  1. Dr. [name], PCP in 1 week with repeat labs (CBC & BMP). 2. May consider outpatient Urology consultation  Home Health: Patient declined SNF and home health services. Equipment/Devices: None  Discharge Condition: Improved and stable. CODE STATUS: Full. Diet recommendation: Heart healthy & diabetic diet.  Discharge Diagnoses:  Principal Problem:   Recurrent UTI Active Problems:   Myeloproliferative disease ([place])   Obstructive sleep apnea   Chronic diastolic CHF (congestive heart failure) ([facility])   Morbid obesity due to excess calories ([facility])   CKD (chronic kidney disease) stage 3, GFR 30-59 ml/min ([facility])   Type II diabetes mellitus ([facility])   Iron deficiency anemia due to chronic blood loss   Pyelonephritis   Brief Summary: 77 year old male with PMH of CAD, chronic diastolic CHF, HTN, DM, OSA not on CPAP, chronic hypoxic respiratory failure on 2 L/min oxygen, severe pulmonary hypertension, AAA, MDS, iron deficiency anemia, recent hospitalization 2/19-2/23 for decompensated CHF, UTI, adamantly refused SNF at discharge, presented with dysuria, urinary frequency and admitted for recurrent UTI.   Assessment & Plan:   Recurrent UTI: Possibly complicating underlying BPH.  Urine culture 07/16/17 showed >100 K colonies per mL pseudomonas aeruginosa, resistant to Cipro.  No oral choices available.  As per Dr. Arlyss Queen discussion with Dr. Linus Salmons, ID, complete course of IV cefepime on 07/22/17 prior to discharge.  Patient started on Myrbetriq for LUTS but did not really have symptoms of overactive bladder hence discontinued at discharge.  Renal ultrasound results from 05/06/17 appreciated.  Patient completed 7 days of IV antibiotics/cefepime. Consider outpatient Urology consultation and follow-up.  Patient has  microscopic hematuria which may be related to UTI but needs to be closely followed up as outpatient with repeat urine microscopy in a week or 2 and if persists will need further evaluation.  Severe pulmonary hypertension/chronic diastolic CHF: Clinically appears compensated.  Continue home dose of furosemide.  Chronic hypoxic respiratory failure: Back on home oxygen 2 L/min and saturating in the high 90s.  Type II DM: Good inpatient control.  Not on medications at this time.  OSA: Not sure if he was compliant with CPAP at home.  Outpatient follow-up.  Essential hypertension: Controlled.  Stage II chronic kidney disease: Creatinine stable/normal.  Normocytic anemia/leukocytosis/MDS: Hemoglobin stable.  Mild chronic leukocytosis.  Outpatient follow-up.  BPH: Continue Flomax.  Consider outpatient urology consultation.    Consultants:  None  Procedures:  None   Discharge Instructions  Discharge Instructions    (HEART FAILURE PATIENTS) Call MD:  Anytime you have any of the following symptoms: 1) 3 pound weight gain in 24 hours or 5 pounds in 1 week 2) shortness of breath, with or without a dry hacking cough 3) swelling in the hands, feet or stomach 4) if you have to sleep on extra pillows at night in order to breathe.   Complete by:  As directed    Call MD for:  difficulty breathing, headache or visual disturbances   Complete by:  As directed    Call MD for:  extreme fatigue   Complete by:  As directed    Call MD for:  persistant dizziness or light-headedness   Complete by:  As directed    Call MD for:  persistant nausea and vomiting   Complete by:  As directed    Call MD for:  severe uncontrolled  pain   Complete by:  As directed    Call MD for:  temperature >100.4   Complete by:  As directed    Diet - low sodium heart healthy   Complete by:  As directed    Increase activity slowly   Complete by:  As directed        Medication List    TAKE these medications    acetaminophen 325 MG tablet Commonly known as:  TYLENOL Take 2 tablets (650 mg total) by mouth every 6 (six) hours as needed for mild pain (or Fever >/= 101).   aspirin EC 81 MG tablet Take 81 mg by mouth daily.   b complex vitamins tablet Take 1 tablet by mouth daily.   benzonatate 100 MG capsule Commonly known as:  TESSALON Take 1 capsule (100 mg total) by mouth 2 (two) times daily as needed for cough.   ferrous gluconate 240 (27 FE) MG tablet Commonly known as:  FERGON Take 240 mg by mouth daily.   furosemide 40 MG tablet Commonly known as:  LASIX Take 80mg  (2tabs) in morning and 40mg  (1tab) in evening   OXYGEN Inhale 2 L into the lungs continuous.   tamsulosin 0.4 MG Caps capsule Commonly known as:  FLOMAX Take 1 capsule (0.4 mg total) by mouth daily after supper.      Follow-up Information    Colon Branch, MD. Schedule an appointment as soon as possible for a visit in 1 week(s).   Specialty:  Internal Medicine Why:  To be seen with repeat labs (CBC & BMP). Contact information: [address] [place] [address] [phone]          No Known Allergies    Procedures/Studies: Dg Chest 2 View  Result Date: 07/16/2017 CLINICAL DATA:  77 year old male with cough. EXAM: CHEST  2 VIEW COMPARISON:  Chest radiograph dated 07/06/2017 FINDINGS: There is cardiomegaly with vascular congestion and possible mild interstitial edema. Superimposed pneumonia is not excluded. Clinical correlation is recommended. No focal consolidation, pleural effusion, or pneumothorax. There is diffuse osteosclerosis consistent with history of myeloproliferative disease. No acute osseous pathology. IMPRESSION: 1. Cardiomegaly with vascular congestion and possible mild interstitial edema. 2. Osteosclerosis. Electronically Signed   By: Anner Crete M.D.   On: 07/16/2017 05:46       Subjective: Denies complaints.  No fever, chills, dysuria, urinary frequency or urgency.   Denies cough or chest pain.  As per RN, no acute issues reported.  Discharge Exam:  Vitals:   07/21/17 1343 07/21/17 2214 07/22/17 0617 07/22/17 1503  BP: (!) 151/72 (!) 150/91 (!) 150/74 (!) 148/75  Pulse: 69 71 61 69  Resp: 18 18 18 18   Temp: 97.7 F (36.5 C) 97.9 F (36.6 C) 98.1 F (36.7 C) 98.3 F (36.8 C)  TempSrc: Oral Oral Oral Oral  SpO2: 100% 100% 99% 98%  Weight:      Height:        General exam: Pleasant elderly male, moderately built and nourished, lying comfortably propped up in bed. Respiratory system: Clear to auscultation. Respiratory effort normal. Cardiovascular system: S1 & S2 heard, RRR. No JVD, murmurs, rubs, gallops or clicks. No pedal edema. Gastrointestinal system: Abdomen is nondistended, soft and nontender. No organomegaly or masses felt. Normal bowel sounds heard. Central nervous system: Alert and oriented. No focal neurological deficits. Extremities: Symmetric 5 x 5 power. Skin: No rashes, lesions or ulcers Psychiatry: Judgement and insight appear impaired. Mood & affect appropriate.  The results of significant diagnostics from this hospitalization (including imaging, microbiology, ancillary and laboratory) are listed below for reference.     Microbiology: Recent Results (from the past 240 hour(s))  Urine Culture     Status: Abnormal   Collection Time: 07/16/17  6:24 AM  Result Value Ref Range Status   Specimen Description URINE, CLEAN CATCH  Final   Special Requests   Final    NONE Performed at [facility], [address], [place], [place] [address]    Culture >=100,000 COLONIES/mL PSEUDOMONAS AERUGINOSA (A)  Final   Report Status 07/18/2017 FINAL  Final   Organism ID, Bacteria PSEUDOMONAS AERUGINOSA (A)  Final      Susceptibility   Pseudomonas aeruginosa - MIC*    CEFTAZIDIME 4 SENSITIVE Sensitive     CIPROFLOXACIN >=4 RESISTANT Resistant     GENTAMICIN 4 SENSITIVE Sensitive     IMIPENEM 2 SENSITIVE Sensitive      PIP/TAZO 32 SENSITIVE Sensitive     CEFEPIME 8 SENSITIVE Sensitive     * >=100,000 COLONIES/mL PSEUDOMONAS AERUGINOSA  Urine culture     Status: Abnormal   Collection Time: 07/16/17 10:16 AM  Result Value Ref Range Status   Specimen Description URINE, CATHETERIZED  Final   Special Requests   Final    NONE Performed at [facility], [place] [address], [place], [place] [address]    Culture >=100,000 COLONIES/mL PSEUDOMONAS AERUGINOSA (A)  Final   Report Status 07/18/2017 FINAL  Final   Organism ID, Bacteria PSEUDOMONAS AERUGINOSA (A)  Final      Susceptibility   Pseudomonas aeruginosa - MIC*    CEFTAZIDIME 4 SENSITIVE Sensitive     CIPROFLOXACIN >=4 RESISTANT Resistant     GENTAMICIN 4 SENSITIVE Sensitive     IMIPENEM 2 SENSITIVE Sensitive     PIP/TAZO 32 SENSITIVE Sensitive     CEFEPIME 8 SENSITIVE Sensitive     * >=100,000 COLONIES/mL PSEUDOMONAS AERUGINOSA     Labs: CBC: Recent Labs  Lab 07/16/17 0558 07/17/17 0312 07/19/17 0926 07/20/17 0558  WBC 16.4* 13.8* 13.0* 16.6*  NEUTROABS 10.1*  --  8.2* 9.9*  HGB 9.0* 8.6* 8.6* 9.4*  HCT 30.0* 29.0* 28.5* 31.0*  MCV 89.6 90.9 92.8 92.3  PLT 388 362 352 992   Basic Metabolic Panel: Recent Labs  Lab 07/16/17 0558 07/17/17 0312 07/19/17 0926 07/20/17 0558  NA 138 139 140 141  K 4.0 4.3 4.1 4.1  CL 105 105 105 107  CO2 23 25 26 26   GLUCOSE 110* 100* 135* 112*  BUN 56* 52* 53* 50*  CREATININE 1.03 1.12 1.15 1.16  CALCIUM 9.9 9.6 9.6 9.8  PHOS  --   --  3.4 3.4   Liver Function Tests: Recent Labs  Lab 07/19/17 0926 07/20/17 0558  ALBUMIN 2.5* 2.7*   BNP (last 3 results) Recent Labs    01/06/17 0931 07/06/17 1317 07/16/17 0558  BNP 435.8* 419.5* 499.9*   Cardiac Enzymes: Recent Labs  Lab 07/16/17 0558  TROPONINI <0.03   CBG: Recent Labs  Lab 07/21/17 1137 07/21/17 1713 07/21/17 2209 07/22/17 0734 07/22/17 1224  GLUCAP 90 121* 100* 89 96   Urinalysis    Component Value Date/Time    COLORURINE YELLOW 07/16/2017 0458   APPEARANCEUR TURBID (A) 07/16/2017 0458   LABSPEC 1.011 07/16/2017 0458   PHURINE 6.0 07/16/2017 0458   GLUCOSEU NEGATIVE 07/16/2017 0458   GLUCOSEU NEGATIVE 05/02/2015 1008   HGBUR SMALL (A) 07/16/2017 0458   BILIRUBINUR  NEGATIVE 07/16/2017 0458   KETONESUR NEGATIVE 07/16/2017 0458   PROTEINUR 100 (A) 07/16/2017 0458   UROBILINOGEN 0.2 05/02/2015 1008   NITRITE POSITIVE (A) 07/16/2017 0458   LEUKOCYTESUR LARGE (A) 07/16/2017 0458      Time coordinating discharge: Over 30 minutes  SIGNED:  Vernell Leep, MD, FACP, [facility]. [facility] Pager [phone]  If 7PM-7AM, please contact night-coverage [email or web address] Password [email or web address] 07/22/2017, 4:55 PM

## 2017-07-22 NOTE — Discharge Instructions (Signed)
Please get your medications reviewed and adjusted by your Primary MD. ° °Please request your Primary MD to go over all Hospital Tests and Procedure/Radiological results at the follow up, please get all Hospital records sent to your Prim MD by signing hospital release before you go home. ° °If you had Pneumonia of Lung problems at the Hospital: °Please get a 2 view Chest X ray done in 6-8 weeks after hospital discharge or sooner if instructed by your Primary MD. ° °If you have Congestive Heart Failure: °Please call your Cardiologist or Primary MD anytime you have any of the following symptoms:  °1) 3 pound weight gain in 24 hours or 5 pounds in 1 week  °2) shortness of breath, with or without a dry hacking cough  °3) swelling in the hands, feet or stomach  °4) if you have to sleep on extra pillows at night in order to breathe ° °Follow cardiac low salt diet and 1.5 lit/day fluid restriction. ° °If you have diabetes °Accuchecks 4 times/day, Once in AM empty stomach and then before each meal. °Log in all results and show them to your primary doctor at your next visit. °If any glucose reading is under 80 or above 300 call your primary MD immediately. ° °If you have Seizure/Convulsions/Epilepsy: °Please do not drive, operate heavy machinery, participate in activities at heights or participate in high speed sports until you have seen by Primary MD or a Neurologist and advised to do so again. ° °If you had Gastrointestinal Bleeding: °Please ask your Primary MD to check a complete blood count within one week of discharge or at your next visit. Your endoscopic/colonoscopic biopsies that are pending at the time of discharge, will also need to followed by your Primary MD. ° °Get Medicines reviewed and adjusted. °Please take all your medications with you for your next visit with your Primary MD ° °Please request your Primary MD to go over all hospital tests and procedure/radiological results at the follow up, please ask your  Primary MD to get all Hospital records sent to his/her office. ° °If you experience worsening of your admission symptoms, develop shortness of breath, life threatening emergency, suicidal or homicidal thoughts you must seek medical attention immediately by calling 911 or calling your MD immediately  if symptoms less severe. ° °You must read complete instructions/literature along with all the possible adverse reactions/side effects for all the Medicines you take and that have been prescribed to you. Take any new Medicines after you have completely understood and accpet all the possible adverse reactions/side effects.  ° °Do not drive or operate heavy machinery when taking Pain medications.  ° °Do not take more than prescribed Pain, Sleep and Anxiety Medications ° °Special Instructions: If you have smoked or chewed Tobacco  in the last 2 yrs please stop smoking, stop any regular Alcohol  and or any Recreational drug use. ° °Wear Seat belts while driving. ° °Please note °You were cared for by a hospitalist during your hospital stay. If you have any questions about your discharge medications or the care you received while you were in the hospital after you are discharged, you can call the unit and asked to speak with the hospitalist on call if the hospitalist that took care of you is not available. Once you are discharged, your primary care physician will handle any further medical issues. Please note that NO REFILLS for any discharge medications will be authorized once you are discharged, as it is imperative that you   return to your primary care physician (or establish a relationship with a primary care physician if you do not have one) for your aftercare needs so that they can reassess your need for medications and monitor your lab values.  You can reach the hospitalist office at phone [phone] or fax [phone]   If you do not have a primary care physician, you can call [phone] for a physician  referral.   Urinary Tract Infection, Adult A urinary tract infection (UTI) is an infection of any part of the urinary tract. The urinary tract includes the:  Kidneys.  Ureters.  Bladder.  Urethra.  These organs make, store, and get rid of pee (urine) in the body. Follow these instructions at home:  Take over-the-counter and prescription medicines only as told by your doctor.  If you were prescribed an antibiotic medicine, take it as told by your doctor. Do not stop taking the antibiotic even if you start to feel better.  Avoid the following drinks: ? Alcohol. ? Caffeine. ? Tea. ? Carbonated drinks.  Drink enough fluid to keep your pee clear or pale yellow.  Keep all follow-up visits as told by your doctor. This is important.  Make sure to: ? Empty your bladder often and completely. Do not to hold pee for long periods of time. ? Empty your bladder before and after sex. ? Wipe from front to back after a bowel movement if you are male. Use each tissue one time when you wipe. Contact a doctor if:  You have back pain.  You have a fever.  You feel sick to your stomach (nauseous).  You throw up (vomit).  Your symptoms do not get better after 3 days.  Your symptoms go away and then come back. Get help right away if:  You have very bad back pain.  You have very bad lower belly (abdominal) pain.  You are throwing up and cannot keep down any medicines or water. This information is not intended to replace advice given to you by your health care provider. Make sure you discuss any questions you have with your health care provider. Document Released: 10/21/2007 Document Revised: 10/10/2015 Document Reviewed: 03/25/2015 Elsevier Interactive Patient Education  [employer].

## 2017-07-22 NOTE — Progress Notes (Signed)
Discharge paperwork is in, patient ready to be discharged home per Dr. Algis Liming. [facility] called at 1700 for ride home, they stated ETA is about 1 to 1.5 hours.

## 2017-07-22 NOTE — Plan of Care (Signed)
  Progressing Education: Knowledge of General Education information will improve 07/22/2017 0935 - Progressing by Harlin Heys, RN Health Behavior/Discharge Planning: Ability to manage health-related needs will improve 07/22/2017 0935 - Progressing by Harlin Heys, RN Clinical Measurements: Ability to maintain clinical measurements within normal limits will improve 07/22/2017 0935 - Progressing by Harlin Heys, RN Will remain free from infection 07/22/2017 0935 - Progressing by Harlin Heys, RN Diagnostic test results will improve 07/22/2017 0935 - Progressing by Harlin Heys, RN Respiratory complications will improve 07/22/2017 0935 - Progressing by Harlin Heys, RN Cardiovascular complication will be avoided 07/22/2017 0935 - Progressing by Harlin Heys, RN Activity: Risk for activity intolerance will decrease 07/22/2017 0935 - Progressing by Harlin Heys, RN Nutrition: Adequate nutrition will be maintained 07/22/2017 0935 - Progressing by Harlin Heys, RN Coping: Level of anxiety will decrease 07/22/2017 0935 - Progressing by Harlin Heys, RN Elimination: Will not experience complications related to bowel motility 07/22/2017 0935 - Progressing by Harlin Heys, RN Will not experience complications related to urinary retention 07/22/2017 0935 - Progressing by Harlin Heys, RN Pain Managment: General experience of comfort will improve 07/22/2017 0935 - Progressing by Harlin Heys, RN Safety: Ability to remain free from injury will improve 07/22/2017 0935 - Progressing by Harlin Heys, RN Skin Integrity: Risk for impaired skin integrity will decrease 07/22/2017 0935 - Progressing by Harlin Heys, RN Spiritual Needs Ability to function at adequate level 07/22/2017 0935 - Progressing by Harlin Heys, RN

## 2017-07-22 NOTE — Progress Notes (Signed)
[facility] here to pick up pt to transfer pt to to home.

## 2017-07-23 ENCOUNTER — Inpatient Hospital Stay: Payer: Self-pay | Admitting: Internal Medicine

## 2017-07-23 ENCOUNTER — Telehealth: Payer: Self-pay | Admitting: *Deleted

## 2017-07-23 NOTE — Telephone Encounter (Signed)
thx

## 2017-07-23 NOTE — Telephone Encounter (Signed)
TCM/ HOSPITAL F/U CALL:   Pt states that he is fine, but did not feel like (refused) to answer any questions. States he has appt 08/04/17 @1020  and we can ask him anything then. Agrees to call our office or 911 if needed.

## 2017-08-02 DIAGNOSIS — R197 Diarrhea, unspecified: Secondary | ICD-10-CM | POA: Diagnosis not present

## 2017-08-02 DIAGNOSIS — N390 Urinary tract infection, site not specified: Secondary | ICD-10-CM | POA: Diagnosis not present

## 2017-08-02 DIAGNOSIS — R0602 Shortness of breath: Secondary | ICD-10-CM | POA: Diagnosis not present

## 2017-08-02 DIAGNOSIS — R1031 Right lower quadrant pain: Secondary | ICD-10-CM | POA: Diagnosis not present

## 2017-08-03 ENCOUNTER — Emergency Department (HOSPITAL_COMMUNITY): Payer: Medicare Other

## 2017-08-03 ENCOUNTER — Inpatient Hospital Stay (HOSPITAL_COMMUNITY)
Admission: EM | Admit: 2017-08-03 | Discharge: 2017-08-16 | DRG: 690 | Disposition: A | Discharge status: Home Health | Payer: Medicare Other | Attending: Family Medicine | Admitting: Family Medicine

## 2017-08-03 ENCOUNTER — Encounter (HOSPITAL_COMMUNITY): Payer: Self-pay

## 2017-08-03 DIAGNOSIS — I700 Atherosclerosis of aorta: Secondary | ICD-10-CM | POA: Diagnosis present

## 2017-08-03 DIAGNOSIS — N183 Chronic kidney disease, stage 3 unspecified: Secondary | ICD-10-CM | POA: Diagnosis present

## 2017-08-03 DIAGNOSIS — Z742 Need for assistance at home and no other household member able to render care: Secondary | ICD-10-CM | POA: Diagnosis not present

## 2017-08-03 DIAGNOSIS — E1122 Type 2 diabetes mellitus with diabetic chronic kidney disease: Secondary | ICD-10-CM | POA: Diagnosis present

## 2017-08-03 DIAGNOSIS — I272 Pulmonary hypertension, unspecified: Secondary | ICD-10-CM | POA: Diagnosis present

## 2017-08-03 DIAGNOSIS — N136 Pyonephrosis: Principal | ICD-10-CM | POA: Diagnosis present

## 2017-08-03 DIAGNOSIS — B965 Pseudomonas (aeruginosa) (mallei) (pseudomallei) as the cause of diseases classified elsewhere: Secondary | ICD-10-CM | POA: Diagnosis not present

## 2017-08-03 DIAGNOSIS — J9611 Chronic respiratory failure with hypoxia: Secondary | ICD-10-CM | POA: Diagnosis present

## 2017-08-03 DIAGNOSIS — D500 Iron deficiency anemia secondary to blood loss (chronic): Secondary | ICD-10-CM | POA: Diagnosis present

## 2017-08-03 DIAGNOSIS — Z79899 Other long term (current) drug therapy: Secondary | ICD-10-CM

## 2017-08-03 DIAGNOSIS — Z9119 Patient's noncompliance with other medical treatment and regimen: Secondary | ICD-10-CM

## 2017-08-03 DIAGNOSIS — C946 Myelodysplastic disease, not classified: Secondary | ICD-10-CM | POA: Diagnosis present

## 2017-08-03 DIAGNOSIS — N3001 Acute cystitis with hematuria: Secondary | ICD-10-CM | POA: Diagnosis not present

## 2017-08-03 DIAGNOSIS — Z87891 Personal history of nicotine dependence: Secondary | ICD-10-CM | POA: Diagnosis not present

## 2017-08-03 DIAGNOSIS — I712 Thoracic aortic aneurysm, without rupture: Secondary | ICD-10-CM | POA: Diagnosis present

## 2017-08-03 DIAGNOSIS — I5032 Chronic diastolic (congestive) heart failure: Secondary | ICD-10-CM | POA: Diagnosis not present

## 2017-08-03 DIAGNOSIS — Z9981 Dependence on supplemental oxygen: Secondary | ICD-10-CM | POA: Diagnosis not present

## 2017-08-03 DIAGNOSIS — M109 Gout, unspecified: Secondary | ICD-10-CM | POA: Diagnosis present

## 2017-08-03 DIAGNOSIS — Z7982 Long term (current) use of aspirin: Secondary | ICD-10-CM

## 2017-08-03 DIAGNOSIS — N401 Enlarged prostate with lower urinary tract symptoms: Secondary | ICD-10-CM | POA: Diagnosis not present

## 2017-08-03 DIAGNOSIS — E1140 Type 2 diabetes mellitus with diabetic neuropathy, unspecified: Secondary | ICD-10-CM | POA: Diagnosis present

## 2017-08-03 DIAGNOSIS — I2510 Atherosclerotic heart disease of native coronary artery without angina pectoris: Secondary | ICD-10-CM | POA: Diagnosis present

## 2017-08-03 DIAGNOSIS — G4733 Obstructive sleep apnea (adult) (pediatric): Secondary | ICD-10-CM | POA: Diagnosis present

## 2017-08-03 DIAGNOSIS — D729 Disorder of white blood cells, unspecified: Secondary | ICD-10-CM | POA: Diagnosis not present

## 2017-08-03 DIAGNOSIS — N1330 Unspecified hydronephrosis: Secondary | ICD-10-CM | POA: Diagnosis present

## 2017-08-03 DIAGNOSIS — I130 Hypertensive heart and chronic kidney disease with heart failure and stage 1 through stage 4 chronic kidney disease, or unspecified chronic kidney disease: Secondary | ICD-10-CM | POA: Diagnosis present

## 2017-08-03 DIAGNOSIS — R1031 Right lower quadrant pain: Secondary | ICD-10-CM | POA: Diagnosis not present

## 2017-08-03 DIAGNOSIS — D72829 Elevated white blood cell count, unspecified: Secondary | ICD-10-CM | POA: Diagnosis not present

## 2017-08-03 DIAGNOSIS — R338 Other retention of urine: Secondary | ICD-10-CM | POA: Diagnosis not present

## 2017-08-03 DIAGNOSIS — J80 Acute respiratory distress syndrome: Secondary | ICD-10-CM | POA: Diagnosis not present

## 2017-08-03 DIAGNOSIS — R109 Unspecified abdominal pain: Secondary | ICD-10-CM | POA: Diagnosis not present

## 2017-08-03 DIAGNOSIS — E119 Type 2 diabetes mellitus without complications: Secondary | ICD-10-CM

## 2017-08-03 DIAGNOSIS — R197 Diarrhea, unspecified: Secondary | ICD-10-CM | POA: Diagnosis not present

## 2017-08-03 DIAGNOSIS — Z748 Other problems related to care provider dependency: Secondary | ICD-10-CM | POA: Diagnosis not present

## 2017-08-03 DIAGNOSIS — N320 Bladder-neck obstruction: Secondary | ICD-10-CM | POA: Diagnosis present

## 2017-08-03 DIAGNOSIS — N390 Urinary tract infection, site not specified: Secondary | ICD-10-CM | POA: Diagnosis not present

## 2017-08-03 DIAGNOSIS — Z8673 Personal history of transient ischemic attack (TIA), and cerebral infarction without residual deficits: Secondary | ICD-10-CM | POA: Diagnosis not present

## 2017-08-03 DIAGNOSIS — N400 Enlarged prostate without lower urinary tract symptoms: Secondary | ICD-10-CM | POA: Diagnosis present

## 2017-08-03 DIAGNOSIS — Z008 Encounter for other general examination: Secondary | ICD-10-CM | POA: Diagnosis not present

## 2017-08-03 DIAGNOSIS — Z818 Family history of other mental and behavioral disorders: Secondary | ICD-10-CM | POA: Diagnosis not present

## 2017-08-03 DIAGNOSIS — D7581 Myelofibrosis: Secondary | ICD-10-CM | POA: Diagnosis present

## 2017-08-03 DIAGNOSIS — R0602 Shortness of breath: Secondary | ICD-10-CM | POA: Diagnosis not present

## 2017-08-03 LAB — URINALYSIS, ROUTINE W REFLEX MICROSCOPIC
Bilirubin Urine: NEGATIVE
GLUCOSE, UA: NEGATIVE mg/dL
Ketones, ur: NEGATIVE mg/dL
Nitrite: POSITIVE — AB
PROTEIN: 100 mg/dL — AB
Specific Gravity, Urine: 1.011 (ref 1.005–1.030)
pH: 6 (ref 5.0–8.0)

## 2017-08-03 LAB — CBC
HCT: 32.1 % — ABNORMAL LOW (ref 39.0–52.0)
HEMOGLOBIN: 9.9 g/dL — AB (ref 13.0–17.0)
MCH: 27.3 pg (ref 26.0–34.0)
MCHC: 30.8 g/dL (ref 30.0–36.0)
MCV: 88.7 fL (ref 78.0–100.0)
Platelets: 297 10*3/uL (ref 150–400)
RBC: 3.62 MIL/uL — AB (ref 4.22–5.81)
RDW: 17.3 % — ABNORMAL HIGH (ref 11.5–15.5)
WBC: 16.8 10*3/uL — AB (ref 4.0–10.5)

## 2017-08-03 LAB — COMPREHENSIVE METABOLIC PANEL
ALK PHOS: 110 U/L (ref 38–126)
ALT: 16 U/L — ABNORMAL LOW (ref 17–63)
ANION GAP: 9 (ref 5–15)
AST: 23 U/L (ref 15–41)
Albumin: 3 g/dL — ABNORMAL LOW (ref 3.5–5.0)
BILIRUBIN TOTAL: 0.1 mg/dL — AB (ref 0.3–1.2)
BUN: 57 mg/dL — ABNORMAL HIGH (ref 6–20)
CALCIUM: 9.9 mg/dL (ref 8.9–10.3)
CO2: 24 mmol/L (ref 22–32)
Chloride: 107 mmol/L (ref 101–111)
Creatinine, Ser: 1.18 mg/dL (ref 0.61–1.24)
GFR calc non Af Amer: 59 mL/min — ABNORMAL LOW (ref >60)
Glucose, Bld: 102 mg/dL — ABNORMAL HIGH (ref 65–99)
POTASSIUM: 4.1 mmol/L (ref 3.5–5.1)
Sodium: 140 mmol/L (ref 135–145)
TOTAL PROTEIN: 6.1 g/dL — AB (ref 6.5–8.1)

## 2017-08-03 LAB — LIPASE, BLOOD: Lipase: 27 U/L (ref 11–51)

## 2017-08-03 MED ORDER — ASPIRIN EC 81 MG PO TBEC
81.0000 mg | DELAYED_RELEASE_TABLET | Freq: Every day | ORAL | Status: DC
  Administered 2017-08-04 – 2017-08-16 (×13): 81 mg via ORAL
  Filled 2017-08-03 (×13): qty 1

## 2017-08-03 MED ORDER — INSULIN ASPART 100 UNIT/ML ~~LOC~~ SOLN
0.0000 [IU] | Freq: Three times a day (TID) | SUBCUTANEOUS | Status: DC
  Administered 2017-08-07 – 2017-08-09 (×3): 1 [IU] via SUBCUTANEOUS

## 2017-08-03 MED ORDER — TAMSULOSIN HCL 0.4 MG PO CAPS
0.4000 mg | ORAL_CAPSULE | Freq: Every day | ORAL | Status: DC
  Administered 2017-08-03 – 2017-08-15 (×13): 0.4 mg via ORAL
  Filled 2017-08-03 (×13): qty 1

## 2017-08-03 MED ORDER — CEFEPIME HCL 1 G IJ SOLR
1.0000 g | Freq: Three times a day (TID) | INTRAMUSCULAR | Status: DC
  Administered 2017-08-03 – 2017-08-06 (×8): 1 g via INTRAVENOUS
  Filled 2017-08-03 (×9): qty 1

## 2017-08-03 MED ORDER — ONDANSETRON HCL 4 MG PO TABS: 4.0000 mg | ORAL_TABLET | Freq: Four times a day (QID) | ORAL | Status: DC | PRN

## 2017-08-03 MED ORDER — IOPAMIDOL (ISOVUE-300) INJECTION 61%
INTRAVENOUS | Status: AC
  Administered 2017-08-03: 100 mL
  Filled 2017-08-03: qty 100

## 2017-08-03 MED ORDER — ACETAMINOPHEN 325 MG PO TABS
650.0000 mg | ORAL_TABLET | Freq: Four times a day (QID) | ORAL | Status: DC | PRN
  Administered 2017-08-04 – 2017-08-10 (×4): 650 mg via ORAL
  Filled 2017-08-03 (×4): qty 2

## 2017-08-03 MED ORDER — POLYETHYLENE GLYCOL 3350 17 G PO PACK: 17.0000 g | PACK | Freq: Every day | ORAL | Status: DC | PRN

## 2017-08-03 MED ORDER — SODIUM CHLORIDE 0.9 % IV SOLN
2.0000 g | Freq: Three times a day (TID) | INTRAVENOUS | Status: DC
  Filled 2017-08-03 (×2): qty 2

## 2017-08-03 MED ORDER — ENOXAPARIN SODIUM 40 MG/0.4ML ~~LOC~~ SOLN
40.0000 mg | SUBCUTANEOUS | Status: DC
  Administered 2017-08-04 – 2017-08-16 (×13): 40 mg via SUBCUTANEOUS
  Filled 2017-08-03 (×13): qty 0.4

## 2017-08-03 MED ORDER — ONDANSETRON HCL 4 MG/2ML IJ SOLN: 4.0000 mg | Freq: Four times a day (QID) | INTRAMUSCULAR | Status: DC | PRN

## 2017-08-03 MED ORDER — ACETAMINOPHEN 650 MG RE SUPP: 650.0000 mg | Freq: Four times a day (QID) | RECTAL | Status: DC | PRN

## 2017-08-03 NOTE — ED Provider Notes (Signed)
[facility] Provider Note   CSN: [id number] Arrival date & time: 08/03/17  1104     History   Chief Complaint Chief Complaint  Patient presents with  . Abdominal Pain  . Dysuria    HPI Mihran Lebarron is a 77 y.o. male with a history of a sending aortic aneurysm, CAD, chronic CHF, HTN, recurrent UTI, type 2 diabetes, CKD, and OSA presents today for evaluation of acute onset, progressively worsening urinary symptoms for 2 days.  He states his symptoms began yesterday and worsened today which prompted him to present to the ED.  He was recently hospitalized for UTI which grew Pseudomonas with no oral antibiotic susceptibility.  He spent 7 days receiving IV cefepime and was discharged on 07/22/17.  He states that yesterday he began developing dysuria and intermittent aching pain to the  lower abdomen which he describes as a soreness.  Pain is worse in the right lower quadrant and left lower quadrant, does not radiate.  He states it worsens with position changes and sometimes with eating.  He states he was told he had a low-grade fever this morning and endorses some diaphoresis and chills at that time. Endorses nausea but no vomiting. He notes no changes in his chronic shortness of breath and lower extremity edema, denies chest pain.  He endorses dysuria and decreased urine output.  Denies hematuria.  He notes 3 episodes of watery nonbloody diarrhea over the past 2 days but states this is chronic for him.  He denies suspicious food intake.  He is on 2 L via nasal cannula at baseline 24/7.    The history is provided by the patient.    Past Medical History:  Diagnosis Date  . Allergic rhinitis   . Anemia   . Ascending aortic aneurysm ([place]) 03/2014   4.3cm on CT scan  . CAD (coronary artery disease)    dx elsewheer in past, no documentation. Non-ischemic myoview 2007  . Chronic diastolic CHF (congestive heart failure), NYHA class 2 (HCC)    Normal EF w/ grade 1 dd by echo  12/2015  . Edema    R>L leg, u/s 5-12 neg for DVT  . Hemorrhoid   . History of thrombocytosis   . Hypertension   . Iron deficiency anemia due to chronic blood loss 03/31/2017  . 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 Migraine    "once/wk at least" (07/11/2013)  . Myeloproliferative disease ([place])   . On home oxygen therapy    "2L; all the time" (05/03/2017)  . Pyelonephritis 04/29/2017  . Shortness of breath   . Sinus congestion   . Sleep apnea, obstructive    at some point used CPAP, was d/c  years ago  . Type II diabetes mellitus ([place])   . UTI (urinary tract infection) 06/2017    Patient Active Problem List   Diagnosis Date Noted  . Pyelonephritis 07/17/2017  . Recurrent UTI 07/16/2017  . Acute exacerbation of CHF (congestive heart failure) ([place]) 07/06/2017  . UTI (urinary tract infection) 07/06/2017  . Acute GI bleeding 06/07/2017  . Hypokalemia   . Iron deficiency anemia due to chronic blood loss 03/31/2017  . 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 Urinary retention 01/24/2017  . 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 Elevated troponin 11/14/2016  . Recurrent pneumonia 10/31/2016  . Pulmonary hypertension severe   . Sprain of left rotator cuff capsule   . Hyperkalemia   . Paroxysmal SVT (supraventricular tachycardia) ([place]) 04/20/2016  .  Type II diabetes mellitus ([place]) 04/20/2016  . Gout 04/20/2016  . B12 deficiency 04/20/2016  . 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 Hydronephrosis   . Thrombocytosis ([place]) 04/07/2016  . Pressure ulcer stage II 12/31/2015  . T wave inversion in EKG 12/29/2015  . PCP NOTES >>>>>>>>>>>>>>>>> 05/02/2015  . Peripheral edema   . Thoracic aortic aneurysm ([place]) 04/26/2014  . CKD (chronic kidney disease) stage 3, GFR 30-59 ml/min (HCC) 04/26/2014  . Generalized weakness 04/06/2014  . Venous stasis dermatitis of both lower extremities   . Morbid obesity due to excess calories ([place]) 03/29/2014  . 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 Poor compliance with advise  05/05/2012  . Annual physical exam 01/14/2012  . Weight loss 01/14/2012  . BPH (benign prostatic hyperplasia) 01/14/2012  . Myeloproliferative disease ([place]) 09/07/2006  . Obstructive sleep apnea 09/07/2006  . Hypertensive heart disease with chronic diastolic congestive heart failure ([place]) 09/07/2006  . Coronary atherosclerosis 09/07/2006  . Chronic diastolic CHF (congestive heart failure) ([place]) 09/07/2006  . Allergic rhinitis 09/07/2006    Past Surgical History:  Procedure Laterality Date  . FLEXIBLE SIGMOIDOSCOPY N/A 06/08/2017   Procedure: FLEXIBLE SIGMOIDOSCOPY;  Surgeon: Carol Ada, MD;  Location: [facility];  Service: Endoscopy;  Laterality: N/A;  . IR FLUORO GUIDE CV LINE RIGHT  05/07/2017  . IR FLUORO RM 30-60 MIN  05/07/2017  . IR US GUIDE VASC ACCESS RIGHT  05/07/2017  . TOE SURGERY Right    "tried to straighten out big toe" (07/11/2013)       Home Medications    Prior to Admission medications   Medication Sig Start Date End Date Taking? Authorizing Provider  acetaminophen (TYLENOL) 325 MG tablet Take 2 tablets (650 mg total) by mouth every 6 (six) hours as needed for mild pain (or Fever >/= 101). 05/08/17  Yes Rai, Ripudeep K, MD  aspirin EC 81 MG tablet Take 81 mg by mouth daily.   Yes [provider]  b complex vitamins tablet Take 1 tablet by mouth daily.   Yes [provider]  benzonatate (TESSALON) 100 MG capsule Take 1 capsule (100 mg total) by mouth 2 (two) times daily as needed for cough. 07/22/17  Yes Hongalgi, Lenis Dickinson, MD  ferrous gluconate (FERGON) 240 (27 FE) MG tablet Take 240 mg by mouth daily.   Yes [provider]  furosemide (LASIX) 40 MG tablet Take 80mg  (2tabs) in morning and 40mg  (1tab) in evening 07/10/17  Yes Domenic Polite, MD  OXYGEN Inhale 2 L into the lungs continuous.   Yes [provider]  tamsulosin (FLOMAX) 0.4 MG CAPS capsule Take 1  capsule (0.4 mg total) by mouth daily after supper. 06/13/17  Yes Bonnielee Haff, MD    Family History Family History  Problem Relation Age of Onset  . Schizophrenia Son   . Mental illness Daughter   . Colon cancer Neg Hx   . Prostate cancer Neg Hx   . Heart attack Neg Hx   . Diabetes Neg Hx     Social History Social History   Tobacco Use  . Smoking status: Former Smoker    Packs/day: 0.25    Years: 12.00    Pack years: 3.00    Types: Cigarettes    Last attempt to quit: 05/18/1966    Years since quitting: 51.2  . Smokeless tobacco: Never Used  . Tobacco comment: quit smoking 45 years ago  Substance Use Topics  . Alcohol use: No  Alcohol/week: 0.0 oz  . Drug use: No     Allergies   Patient has no known allergies.   Review of Systems Review of Systems  Constitutional: Positive for chills, diaphoresis and fever.  Respiratory: Positive for shortness of breath (chronic, unchanged).   Cardiovascular: Positive for leg swelling (chronic, unchanged). Negative for chest pain.  Gastrointestinal: Positive for abdominal pain, diarrhea (chronic, unchanged), nausea and vomiting.  Genitourinary: Positive for decreased urine volume, dysuria and urgency. Negative for hematuria.  All other systems reviewed and are negative.    Physical Exam Updated Vital Signs BP (!) 156/68 (BP Location: Right Arm)   Pulse 61   Temp 98 F (36.7 C) (Oral)   Resp 18   SpO2 99%   Physical Exam  Constitutional: He appears well-developed and well-nourished. No distress.  HENT:  Head: Normocephalic and atraumatic.  Eyes: Conjunctivae are normal. Right eye exhibits no discharge. Left eye exhibits no discharge.  Neck: No JVD present. No tracheal deviation present.  Cardiovascular: Normal rate and intact distal pulses.  2+ radial and DP/PT pulses bl, 1+ pitting edema of the bilateral lower extremities.  Patient states this is chronic and unchanged.  Pulmonary/Chest: Effort normal.  Equal rise  and fall of chest, scattered crackles, 100% on 2 L via nasal cannula which is patient's baseline  Abdominal: Soft. He exhibits no distension. Bowel sounds are increased. There is generalized tenderness. There is CVA tenderness. There is no rigidity, no rebound, no guarding, no tenderness at McBurney's point and negative Murphy's sign.  Maximally tender in the RLQ and LLQ. Bilateral CVAT.   Musculoskeletal: He exhibits no edema.  No midline spine TTP, mild bilateral paraspinal muscle tenderness in the lumbar region, no deformity, crepitus, or step-off noted   Neurological: He is alert.  Skin: Skin is warm and dry. No erythema.  Psychiatric: He has a normal mood and affect. His behavior is normal.  Nursing note and vitals reviewed.    ED Treatments / Results  Labs (all labs ordered are listed, but only abnormal results are displayed) Labs Reviewed  COMPREHENSIVE METABOLIC PANEL - Abnormal; Notable for the following components:      Result Value   Glucose, Bld 102 (*)    BUN 57 (*)    Total Protein 6.1 (*)    Albumin 3.0 (*)    ALT 16 (*)    Total Bilirubin 0.1 (*)    GFR calc non Af Amer 59 (*)    All other components within normal limits  CBC - Abnormal; Notable for the following components:   WBC 16.8 (*)    RBC 3.62 (*)    Hemoglobin 9.9 (*)    HCT 32.1 (*)    RDW 17.3 (*)    All other components within normal limits  URINALYSIS, ROUTINE W REFLEX MICROSCOPIC - Abnormal; Notable for the following components:   APPearance CLOUDY (*)    Hgb urine dipstick SMALL (*)    Protein, ur 100 (*)    Nitrite POSITIVE (*)    Leukocytes, UA LARGE (*)    Bacteria, UA MANY (*)    Squamous Epithelial / LPF 0-5 (*)    All other components within normal limits  URINE CULTURE  MRSA PCR SCREENING  LIPASE, BLOOD  GLUCOSE, CAPILLARY    EKG  EKG Interpretation None       Radiology Ct Abdomen Pelvis W Contrast  Result Date: 08/03/2017 CLINICAL DATA:  Lower abdominal pain. EXAM: CT  ABDOMEN AND PELVIS WITH CONTRAST TECHNIQUE:  Multidetector CT imaging of the abdomen and pelvis was performed using the standard protocol following bolus administration of intravenous contrast. CONTRAST:  161mL ISOVUE-300 IOPAMIDOL (ISOVUE-300) INJECTION 61% COMPARISON:  06/07/2017 FINDINGS: Lower chest: Motion artifact and subsegmental atelectasis in the lung bases. No pleural effusion. Coronary artery atherosclerosis. Unchanged dilatation of the main pulmonary artery to 5 cm diameter as can be seen with pulmonary arterial hypertension. Hepatobiliary: No focal liver abnormality. Moderately distended gallbladder with possible punctate stone but no gallbladder wall thickening or other acute inflammatory changes. No biliary dilatation. Pancreas: Unremarkable. Spleen: Unchanged splenomegaly. Apparent decreased size of peripheral wedge-shaped hypodensity suggesting evolving infarct. Adrenals/Urinary Tract: Unremarkable adrenal glands. Unchanged 2.7 cm right lower pole renal cyst. Mild to moderate right hydroureteronephrosis has mildly improved. Left hydronephrosis on the prior study has resolved. Distended, thick-walled bladder containing multiple diverticula is similar to the prior study. Stomach/Bowel: The stomach is within normal limits. No bowel obstruction. Extensive descending and sigmoid colon diverticulosis without significant acute inflammatory changes identified within limitations of motion artifact. Scattered locules scratched at scattered small locules of gas in the right mid to upper abdomen are favored to be within decompressed small bowel loops rather than extraluminal. The appendix is not well visualized, with motion artifact limiting assessment. No inflammatory changes are seen in the right lower quadrant to suggest acute appendicitis. Vascular/Lymphatic: Abdominal aortic atherosclerosis without aneurysm. No enlarged lymph nodes. Reproductive: At most mild prostatic enlargement, unchanged. Other: No  intraperitoneal free fluid. Unchanged 1.8 cm subcutaneous soft tissue nodule in the left inguinal region. Mild diffuse body wall edema. Musculoskeletal: Diffuse heterogeneous sclerosis throughout the visualized osseous structures, unchanged. IMPRESSION: 1. No acute abnormality identified in the abdomen or pelvis. 2. Mildly improved right hydroureteronephrosis. Resolved left hydronephrosis. 3. Unchanged distension of a thick-walled urinary bladder containing multiple diverticula. 4. Unchanged splenomegaly with evolving infarct as previously seen. 5. Unchanged diffusely abnormal appearance of the bones consistent with known myeloproliferative disorder. 6. Colonic diverticulosis. 7.  Aortic Atherosclerosis (ICD10-I70.0). Electronically Signed   By: Logan Bores M.D.   On: 08/03/2017 17:49    Procedures Procedures (including critical care time)  Medications Ordered in ED Medications  aspirin EC tablet 81 mg (not administered)  tamsulosin (FLOMAX) capsule 0.4 mg (0.4 mg Oral Given 08/03/17 2318)  enoxaparin (LOVENOX) injection 40 mg (not administered)  acetaminophen (TYLENOL) tablet 650 mg (not administered)    Or  acetaminophen (TYLENOL) suppository 650 mg (not administered)  polyethylene glycol (MIRALAX / GLYCOLAX) packet 17 g (not administered)  ondansetron (ZOFRAN) tablet 4 mg (not administered)    Or  ondansetron (ZOFRAN) injection 4 mg (not administered)  insulin aspart (novoLOG) injection 0-9 Units (not administered)  ceFEPIme (MAXIPIME) 1 g in sodium chloride 0.9 % 100 mL IVPB (0 g Intravenous Stopped 08/03/17 2349)  iopamidol (ISOVUE-300) 61 % injection (100 mLs  Contrast Given 08/03/17 1651)     Initial Impression / Assessment and Plan / ED Course  I have reviewed the triage vital signs and the nursing notes.  Pertinent labs & imaging results that were available during my care of the patient were reviewed by me and considered in my medical decision making (see chart for details).      Patient presents with acute onset of urinary tract infection symptoms which began yesterday.  He does have bilateral CVA tenderness and lower abdominal tenderness to palpation.  He is afebrile, vital signs are at patient's baseline lab work reviewed by me shows leukocytosis of 16, similar to 2 weeks ago. UA is  somewhat improved as compared to 2 weeks ago but still has too numerous to count WBCs and is positive for nitrates.CT of the abdomen and pelvis shows mildly improved right hydronephrosis and resolved left hydronephrosis, no other changes. I spoke with Dr. Graylon Good with  Infectious disease who recommends admission given history of complicated UTIs and recurrent UTIs.  We will restart the patient on cefepime. 7:04 PM Spoke with Dr. Denton Brick with [facility] hospitalist service who agrees to assume care of patient and bring him into the hospital for further evaluation and management.  Final Clinical Impressions(s) / ED Diagnoses   Final diagnoses:  Recurrent UTI    ED Discharge Orders    None      Renita Papa, PA-C 08/04/17 0102  Margette Fast, MD 08/04/17 1425

## 2017-08-03 NOTE — H&P (Signed)
History and Physical    Rakesh Dutko VZD:[id number] DOB: June 27, 1940 DOA: 08/03/2017  PCP: Colon Branch, MD   Patient coming from: Home   Chief Complaint: Dysuria  HPI: Juan [name] is a 77 y.o. male with medical history significant for DM, myeloproliferative disorder, diastolic CHF on chronic 2 L O2, CKD3, pulmonary hypertension CVA, OSA, BPH with urinary retention, OSA, gout, and recent discharge from the hospital for UTI secondary to Pseudomonas infection. Patient's recently admitted 3/1- 3/7, managed for UTI urine cultures grew Pseudomonas sensitive to only IV antibiotics, his 7-day course with IV cefepime.  Patient was discharged to home reports he was doing well without any symptoms until, 2 days ago when he developed lower abdominal pain, burning with urination and left flank pain.  He denies shaking chills but endorses feeling cold denies fevers.  He denies reduced urine output to me.  ED Course: Stable vitals.  WBC 10.8.  Creatinine at baseline 1.18.  Hemoglobin low but at baseline 9.9.  CT abdomen and pelvis W contrast-negative for acute abnormality, mild improved right hydroureteronephrosis resolved left hydronephrosis, unchanged distention of thick-walled urinary bladder containing multiple diverticula, unchanged splenomegaly with evolving infarct. Dr. Graylon Good was consulted in the ED recommended admission with IV antibiotics considering presence of CVA tenderness.  Started on IV cefepime.   Review of Systems: As per HPI otherwise 10 point review of systems negative.  Past Medical History:  Diagnosis Date  . Allergic rhinitis   . Anemia   . Ascending aortic aneurysm ([place]) 03/2014   4.3cm on CT scan  . CAD (coronary artery disease)    dx elsewheer in past, no documentation. Non-ischemic myoview 2007  . Chronic diastolic CHF (congestive heart failure), NYHA class 2 (HCC)    Normal EF w/ grade 1 dd by echo 12/2015  . Edema    R>L leg, u/s 5-12 neg for DVT  . Hemorrhoid   . History of  thrombocytosis   . Hypertension   . Iron deficiency anemia due to chronic blood loss 03/31/2017  . 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 Migraine    "once/wk at least" (07/11/2013)  . Myeloproliferative disease ([place])   . On home oxygen therapy    "2L; all the time" (05/03/2017)  . Pyelonephritis 04/29/2017  . Shortness of breath   . Sinus congestion   . Sleep apnea, obstructive    at some point used CPAP, was d/c  years ago  . Type II diabetes mellitus ([place])   . UTI (urinary tract infection) 06/2017    Past Surgical History:  Procedure Laterality Date  . FLEXIBLE SIGMOIDOSCOPY N/A 06/08/2017   Procedure: FLEXIBLE SIGMOIDOSCOPY;  Surgeon: Carol Ada, MD;  Location: [facility];  Service: Endoscopy;  Laterality: N/A;  . IR FLUORO GUIDE CV LINE RIGHT  05/07/2017  . IR FLUORO RM 30-60 MIN  05/07/2017  . IR US GUIDE VASC ACCESS RIGHT  05/07/2017  . TOE SURGERY Right    "tried to straighten out big toe" (07/11/2013)     reports that he quit smoking about 51 years ago. His smoking use included cigarettes. He has a 3.00 pack-year smoking history. he has never used smokeless tobacco. He reports that he does not drink alcohol or use drugs.  No Known Allergies  Family History  Problem Relation Age of Onset  . Schizophrenia Son   . Mental illness Daughter   . Colon cancer Neg Hx   . Prostate cancer Neg Hx   . Heart attack Neg Hx   .  Diabetes Neg Hx     Prior to Admission medications   Medication Sig Start Date End Date Taking? Authorizing Provider  acetaminophen (TYLENOL) 325 MG tablet Take 2 tablets (650 mg total) by mouth every 6 (six) hours as needed for mild pain (or Fever >/= 101). 05/08/17  Yes Rai, Ripudeep K, MD  aspirin EC 81 MG tablet Take 81 mg by mouth daily.   Yes [provider]  b complex vitamins tablet Take 1 tablet by mouth daily.   Yes [provider]  benzonatate (TESSALON) 100 MG capsule Take 1 capsule (100 mg total) by mouth 2 (two) times daily  as needed for cough. 07/22/17  Yes Hongalgi, Lenis Dickinson, MD  ferrous gluconate (FERGON) 240 (27 FE) MG tablet Take 240 mg by mouth daily.   Yes [provider]  furosemide (LASIX) 40 MG tablet Take 80mg  (2tabs) in morning and 40mg  (1tab) in evening 07/10/17  Yes Domenic Polite, MD  OXYGEN Inhale 2 L into the lungs continuous.   Yes [provider]  tamsulosin (FLOMAX) 0.4 MG CAPS capsule Take 1 capsule (0.4 mg total) by mouth daily after supper. 06/13/17  Yes Bonnielee Haff, MD    Physical Exam: Vitals:   08/03/17 1329 08/03/17 1530 08/03/17 1545 08/03/17 1900  BP: (!) 144/61  (!) 142/69 138/66  Pulse: (!) 59 (!) 56 (!) 55 61  Resp: 18  16 16   Temp:      TempSrc:      SpO2: 100% 99% 100% 96%    Constitutional: NAD, calm, comfortable Vitals:   08/03/17 1329 08/03/17 1530 08/03/17 1545 08/03/17 1900  BP: (!) 144/61  (!) 142/69 138/66  Pulse: (!) 59 (!) 56 (!) 55 61  Resp: 18  16 16   Temp:      TempSrc:      SpO2: 100% 99% 100% 96%   Eyes: PERRL, lids and conjunctivae normal ENMT: Mucous membranes are moist. Posterior pharynx clear of any exudate or lesions.Normal dentition.  Neck: normal, supple, no masses, no thyromegaly Respiratory: clear to auscultation bilaterally, no wheezing, no crackles. Normal respiratory effort. No accessory muscle use.  Cardiovascular: Regular rate and rhythm, no murmurs / rubs / gallops. 1+ pitting pedal edema bilaterally. 2+ pedal pulses. No carotid bruits.  Abdomen: mild lower abdominal/suprapubic tenderness, no masses palpated. No hepatosplenomegaly. Bowel sounds positive.  Mild left CVA tenderness Musculoskeletal: no clubbing / cyanosis. No joint deformity upper and lower extremities. Good ROM, no contractures. Normal muscle tone.  Skin: Chronic stasis changes bilateral lower extremities, no lesions, ulcers. No induration Neurologic: CN 2-12 grossly intact. Sensation intact, Strength 5/5 in all 4.  Psychiatric: Normal judgment and  insight. Alert and oriented x 3. Normal mood.   Labs on Admission: I have personally reviewed following labs and imaging studies  CBC: Recent Labs  Lab 08/03/17 1153  WBC 16.8*  HGB 9.9*  HCT 32.1*  MCV 88.7  PLT 295   Basic Metabolic Panel: Recent Labs  Lab 08/03/17 1153  NA 140  K 4.1  CL 107  CO2 24  GLUCOSE 102*  BUN 57*  CREATININE 1.18  CALCIUM 9.9   Liver Function Tests: Recent Labs  Lab 08/03/17 1153  AST 23  ALT 16*  ALKPHOS 110  BILITOT 0.1*  PROT 6.1*  ALBUMIN 3.0*   Recent Labs  Lab 08/03/17 1153  LIPASE 27   Urine analysis:    Component Value Date/Time   COLORURINE YELLOW 08/03/2017 1147   APPEARANCEUR CLOUDY (A) 08/03/2017 1147  LABSPEC 1.011 08/03/2017 1147   PHURINE 6.0 08/03/2017 1147   GLUCOSEU NEGATIVE 08/03/2017 1147   GLUCOSEU NEGATIVE 05/02/2015 1008   HGBUR SMALL (A) 08/03/2017 1147   BILIRUBINUR NEGATIVE 08/03/2017 1147   KETONESUR NEGATIVE 08/03/2017 1147   PROTEINUR 100 (A) 08/03/2017 1147   UROBILINOGEN 0.2 05/02/2015 1008   NITRITE POSITIVE (A) 08/03/2017 1147   LEUKOCYTESUR LARGE (A) 08/03/2017 1147    Radiological Exams on Admission: Ct Abdomen Pelvis W Contrast  Result Date: 08/03/2017 CLINICAL DATA:  Lower abdominal pain. EXAM: CT ABDOMEN AND PELVIS WITH CONTRAST TECHNIQUE: Multidetector CT imaging of the abdomen and pelvis was performed using the standard protocol following bolus administration of intravenous contrast. CONTRAST:  12mL ISOVUE-300 IOPAMIDOL (ISOVUE-300) INJECTION 61% COMPARISON:  06/07/2017 FINDINGS: Lower chest: Motion artifact and subsegmental atelectasis in the lung bases. No pleural effusion. Coronary artery atherosclerosis. Unchanged dilatation of the main pulmonary artery to 5 cm diameter as can be seen with pulmonary arterial hypertension. Hepatobiliary: No focal liver abnormality. Moderately distended gallbladder with possible punctate stone but no gallbladder wall thickening or other acute  inflammatory changes. No biliary dilatation. Pancreas: Unremarkable. Spleen: Unchanged splenomegaly. Apparent decreased size of peripheral wedge-shaped hypodensity suggesting evolving infarct. Adrenals/Urinary Tract: Unremarkable adrenal glands. Unchanged 2.7 cm right lower pole renal cyst. Mild to moderate right hydroureteronephrosis has mildly improved. Left hydronephrosis on the prior study has resolved. Distended, thick-walled bladder containing multiple diverticula is similar to the prior study. Stomach/Bowel: The stomach is within normal limits. No bowel obstruction. Extensive descending and sigmoid colon diverticulosis without significant acute inflammatory changes identified within limitations of motion artifact. Scattered locules scratched at scattered small locules of gas in the right mid to upper abdomen are favored to be within decompressed small bowel loops rather than extraluminal. The appendix is not well visualized, with motion artifact limiting assessment. No inflammatory changes are seen in the right lower quadrant to suggest acute appendicitis. Vascular/Lymphatic: Abdominal aortic atherosclerosis without aneurysm. No enlarged lymph nodes. Reproductive: At most mild prostatic enlargement, unchanged. Other: No intraperitoneal free fluid. Unchanged 1.8 cm subcutaneous soft tissue nodule in the left inguinal region. Mild diffuse body wall edema. Musculoskeletal: Diffuse heterogeneous sclerosis throughout the visualized osseous structures, unchanged. IMPRESSION: 1. No acute abnormality identified in the abdomen or pelvis. 2. Mildly improved right hydroureteronephrosis. Resolved left hydronephrosis. 3. Unchanged distension of a thick-walled urinary bladder containing multiple diverticula. 4. Unchanged splenomegaly with evolving infarct as previously seen. 5. Unchanged diffusely abnormal appearance of the bones consistent with known myeloproliferative disorder. 6. Colonic diverticulosis. 7.  Aortic  Atherosclerosis (ICD10-I70.0). Electronically Signed   By: Logan Bores M.D.   On: 08/03/2017 17:49    EKG: None  Assessment/Plan Active Problems:   Myeloproliferative disease (HCC)   Obstructive sleep apnea   Chronic diastolic CHF (congestive heart failure) (HCC)   BPH (benign prostatic hyperplasia)   CKD (chronic kidney disease) stage 3, GFR 30-59 ml/min (HCC)   Generalized weakness   Hydronephrosis   Type II diabetes mellitus ([place])   Pulmonary hypertension severe   Hypertrophy of prostate with urinary retention   UTI (urinary tract infection)   UTI-symptomatically with dysuria, no abdominal pain, left flank pain. WBC- 10.8.  UA suggestive. With history of Pseudomonas infection, at least twice in the most recent past.  Last urine culture 07/16/17, Pseudomonas susceptible to only IV antibiotics. Patient ruled out for sepsis.  At this time no sign of systemic infection, abdominal CT with contrast negative for pyelonephritis.  But patient has left CVA tenderness-possibly early  pyelonephritis. -Continue IV cefepime started in the ED -Follow-up urine cultures drawn in ED  DM2-glucose 102, not on hypoglycemic agents. - SSI  Diastolic CHF, stable CKD 3- Cr- 1.18-at baseline, appears euvolemic with chronic unchanged bilateral lower extremity edema. -Home medications Lasix 80 a.m. 40 p.m. held for now with contrast exposure for abdominal CT  BPH with history of urinary retention-  -Considering frequency of Pseudomonas infection may benefit from urology evaluation for more aggressive BPH management -Continue home tamsulosin   08/03/2017 CT abdomen pelvis with contrast results IMPRESSION: 1. No acute abnormality identified in the abdomen or pelvis. 2. Mildly improved right hydroureteronephrosis. Resolved left hydronephrosis. 3. Unchanged distension of a thick-walled urinary bladder containing multiple diverticula. 4. Unchanged splenomegaly with evolving infarct as previously seen. 5.  Unchanged diffusely abnormal appearance of the bones consistent with known myeloproliferative disorder. 6. Colonic diverticulosis. 7.  Aortic Atherosclerosis (ICD10-I70.0).    DVT prophylaxis: Lovenox Code Status: Full Family Communication: None at bedside Disposition Plan: ~ 7 days Consults called: None  Admission status: Inpt, med surg   Bethena Roys MD [facility] Pager [phone] From 6PM-2AM.  Otherwise please contact night-coverage [email or web address] Password [facility]  08/03/2017, 8:08 PM

## 2017-08-03 NOTE — ED Notes (Signed)
This RN in room and asked  This RN "I tell you what I want you to do, I want you to order me some supper so that I can have it when the doctor comes back around" Pt informed that he cannot eat at this time until results are back. Pt not happy about this information.

## 2017-08-03 NOTE — ED Triage Notes (Signed)
PT arrives via [facility] from home with complaints of headache, lower right and left abdominal pain, left flank pain, dysuria, since last night. Pt was recently treated for UTI.  Pt wears 2lpm [place] at all times

## 2017-08-04 ENCOUNTER — Encounter (HOSPITAL_COMMUNITY): Payer: Self-pay | Admitting: General Practice

## 2017-08-04 ENCOUNTER — Ambulatory Visit: Payer: Self-pay | Admitting: Internal Medicine

## 2017-08-04 ENCOUNTER — Other Ambulatory Visit: Payer: Self-pay

## 2017-08-04 DIAGNOSIS — N401 Enlarged prostate with lower urinary tract symptoms: Secondary | ICD-10-CM

## 2017-08-04 DIAGNOSIS — I5032 Chronic diastolic (congestive) heart failure: Secondary | ICD-10-CM

## 2017-08-04 DIAGNOSIS — N183 Chronic kidney disease, stage 3 (moderate): Secondary | ICD-10-CM

## 2017-08-04 DIAGNOSIS — N1330 Unspecified hydronephrosis: Secondary | ICD-10-CM

## 2017-08-04 DIAGNOSIS — R338 Other retention of urine: Secondary | ICD-10-CM

## 2017-08-04 DIAGNOSIS — D72829 Elevated white blood cell count, unspecified: Secondary | ICD-10-CM

## 2017-08-04 LAB — GLUCOSE, CAPILLARY
Glucose-Capillary: 121 mg/dL — ABNORMAL HIGH (ref 65–99)
Glucose-Capillary: 137 mg/dL — ABNORMAL HIGH (ref 65–99)
Glucose-Capillary: 91 mg/dL (ref 65–99)
Glucose-Capillary: 96 mg/dL (ref 65–99)
Glucose-Capillary: 98 mg/dL (ref 65–99)

## 2017-08-04 LAB — BASIC METABOLIC PANEL
Anion gap: 8 (ref 5–15)
BUN: 52 mg/dL — AB (ref 6–20)
CO2: 25 mmol/L (ref 22–32)
CREATININE: 1.1 mg/dL (ref 0.61–1.24)
Calcium: 9.8 mg/dL (ref 8.9–10.3)
Chloride: 109 mmol/L (ref 101–111)
GFR calc Af Amer: >60 mL/min (ref >60)
GLUCOSE: 110 mg/dL — AB (ref 65–99)
POTASSIUM: 4.3 mmol/L (ref 3.5–5.1)
Sodium: 142 mmol/L (ref 135–145)

## 2017-08-04 LAB — CBC WITH DIFFERENTIAL/PLATELET
Band Neutrophils: 15 %
Basophils Absolute: 0 K/uL (ref 0.0–0.1)
Basophils Relative: 0 %
Blasts: 5 %
Eosinophils Absolute: 0.3 K/uL (ref 0.0–0.7)
Eosinophils Relative: 2 %
HCT: 29.3 % — ABNORMAL LOW (ref 39.0–52.0)
Hemoglobin: 8.7 g/dL — ABNORMAL LOW (ref 13.0–17.0)
Lymphocytes Relative: 28 %
Lymphs Abs: 3.9 K/uL (ref 0.7–4.0)
MCH: 26.4 pg (ref 26.0–34.0)
MCHC: 29.7 g/dL — ABNORMAL LOW (ref 30.0–36.0)
MCV: 88.8 fL (ref 78.0–100.0)
Metamyelocytes Relative: 4 %
Monocytes Absolute: 1.8 K/uL — ABNORMAL HIGH (ref 0.1–1.0)
Monocytes Relative: 13 %
Myelocytes: 4 %
Neutro Abs: 7.2 K/uL (ref 1.7–7.7)
Neutrophils Relative %: 28 %
Other: 0 %
Platelets: 243 K/uL (ref 150–400)
Promyelocytes Absolute: 1 %
RBC: 3.3 MIL/uL — ABNORMAL LOW (ref 4.22–5.81)
RDW: 16.9 % — ABNORMAL HIGH (ref 11.5–15.5)
WBC Morphology: INCREASED
WBC: 13.8 K/uL — ABNORMAL HIGH (ref 4.0–10.5)
nRBC: 0 /100{WBCs}

## 2017-08-04 LAB — MRSA PCR SCREENING: MRSA by PCR: NEGATIVE

## 2017-08-04 NOTE — Progress Notes (Signed)
Patient ID: Juan [name], male   DOB: 11/27/1940, 78 y.o.   MRN: [id number]  PROGRESS NOTE    Juan [name]  MHD:[id number] DOB: 1941-04-21 DOA: 08/03/2017 PCP: Colon Branch, MD   Brief Narrative:  77 year old male with history of diabetes mellitus, myeloproliferative disorder, diastolic CHF, chronic hypoxic respiratory failure on home oxygen, chronic kidney disease stage III, pulmonary hypertension, CVA, OSA, BPH with urinary retention, gout and recent discharge from hospital on 07/22/2017 for UTI secondary to Pseudomonas infection which was treated with intravenous antibiotics/cefepime for 7-day course presented on 08/03/2017 with dysuria and bilateral flank pain.  CT abdomen and pelvis showed right hydroureteronephrosis and resolved left hydronephrosis.  ED provider consulted with ID/Dr.Snyder on phone and patient was started on IV cefepime.   Assessment & Plan:   Active Problems:   Myeloproliferative disease (HCC)   Obstructive sleep apnea   Chronic diastolic CHF (congestive heart failure) (HCC)   BPH (benign prostatic hyperplasia)   CKD (chronic kidney disease) stage 3, GFR 30-59 ml/min (HCC)   Hydronephrosis   Type II diabetes mellitus (HCC)   Pulmonary hypertension severe   Hypertrophy of prostate with urinary retention   UTI (urinary tract infection)   Probable complicated UTI in a patient with recent Pseudomonas UTI -Patient has right hydroureteronephrosis and resolved left hydronephrosis along with distended urinary bladder -Spoke to Dr. Borden/urology who recommended placement of Foley catheter.  He will see the patient in consultation -Continue cefepime.  Follow urine cultures. -I have ordered blood cultures as well  Leukocytosis -Probably secondary to above.  Continue cefepime.  Repeat a.m. labs.  Blood cultures  DM2 -Probably diet-controlled.  Not on oral meds at home -Accu-Cheks with insulin sliding scale coverage  Chronic diastolic CHF -Currently compensated.  Lasix on  hold because patient received contrast for CT abdomen yesterday -if a.m. creatinine remained stable, home Lasix can be restarted,  CKD stage 3 -Stable on admission.  Will repeat creatinine stat and in a.m.  BPH with history of urinary retention-  -Urology consulted -Continue home tamsulosin    DVT prophylaxis: Lovenox Code Status: Full Family Communication: None at bedside Disposition Plan: Depends on clinical outcome  Consultants: Urology  Procedures: None  Antimicrobials: Cefepime from 08/03/2017 onwards   Subjective: Patient seen and examined at bedside.  He feels slightly better but still complains of burning urination and some flank pain.  No overnight fever or vomiting.  Objective: Vitals:   08/03/17 2030 08/03/17 2100 08/03/17 2156 08/04/17 0555  BP: 140/61 138/62 (!) 156/68 (!) 129/58  Pulse: (!) 57 64 61 74  Resp:  16 18 20   Temp:   98 F (36.7 C) 98.3 F (36.8 C)  TempSrc:   Oral Oral  SpO2: 98% 95% 99% 98%    Intake/Output Summary (Last 24 hours) at 08/04/2017 1024 Last data filed at 08/04/2017 0531 Gross per 24 hour  Intake 650 ml  Output 600 ml  Net 50 ml   There were no vitals filed for this visit.  Examination:  General exam: Appears calm and comfortable.  No distress Respiratory system: Bilateral decreased breath sound at bases Cardiovascular system: S1 & S2 heard, rate controlled  gastrointestinal system: Abdomen is nondistended, soft. Normal bowel sounds heard.  Mild suprapubic tenderness and left CVA tenderness Central nervous system: Alert and oriented. No focal neurological deficits. Moving extremities Extremities: No cyanosis, clubbing; trace edema  skin: Chronic stasis changes in bilateral lower extremities Psychiatry: Judgement and insight appear normal. Mood & affect appropriate.  Data Reviewed: I have personally reviewed following labs and imaging studies  CBC: Recent Labs  Lab 08/03/17 1153  WBC 16.8*  HGB 9.9*  HCT  32.1*  MCV 88.7  PLT 956   Basic Metabolic Panel: Recent Labs  Lab 08/03/17 1153  NA 140  K 4.1  CL 107  CO2 24  GLUCOSE 102*  BUN 57*  CREATININE 1.18  CALCIUM 9.9   GFR: CrCl cannot be calculated (Unknown ideal weight.). Liver Function Tests: Recent Labs  Lab 08/03/17 1153  AST 23  ALT 16*  ALKPHOS 110  BILITOT 0.1*  PROT 6.1*  ALBUMIN 3.0*   Recent Labs  Lab 08/03/17 1153  LIPASE 27   No results for input(s): AMMONIA in the last 168 hours. Coagulation Profile: No results for input(s): INR, PROTIME in the last 168 hours. Cardiac Enzymes: No results for input(s): CKTOTAL, CKMB, CKMBINDEX, TROPONINI in the last 168 hours. BNP (last 3 results) No results for input(s): PROBNP in the last 8760 hours. HbA1C: No results for input(s): HGBA1C in the last 72 hours. CBG: Recent Labs  Lab 08/04/17 0048 08/04/17 0719  GLUCAP 91 121*   Lipid Profile: No results for input(s): CHOL, HDL, LDLCALC, TRIG, CHOLHDL, LDLDIRECT in the last 72 hours. Thyroid Function Tests: No results for input(s): TSH, T4TOTAL, FREET4, T3FREE, THYROIDAB in the last 72 hours. Anemia Panel: No results for input(s): VITAMINB12, FOLATE, FERRITIN, TIBC, IRON, RETICCTPCT in the last 72 hours. Sepsis Labs: No results for input(s): PROCALCITON, LATICACIDVEN in the last 168 hours.  Recent Results (from the past 240 hour(s))  MRSA PCR Screening     Status: None   Collection Time: 08/03/17 10:43 PM  Result Value Ref Range Status   MRSA by PCR NEGATIVE NEGATIVE Final    Comment:        The GeneXpert MRSA Assay (FDA approved for NASAL specimens only), is one component of a comprehensive MRSA colonization surveillance program. It is not intended to diagnose MRSA infection nor to guide or monitor treatment for MRSA infections. Performed at [facility], [place] [address] [place], [place] [address]          Radiology Studies: Ct Abdomen Pelvis W Contrast  Result Date:  08/03/2017 CLINICAL DATA:  Lower abdominal pain. EXAM: CT ABDOMEN AND PELVIS WITH CONTRAST TECHNIQUE: Multidetector CT imaging of the abdomen and pelvis was performed using the standard protocol following bolus administration of intravenous contrast. CONTRAST:  172mL ISOVUE-300 IOPAMIDOL (ISOVUE-300) INJECTION 61% COMPARISON:  06/07/2017 FINDINGS: Lower chest: Motion artifact and subsegmental atelectasis in the lung bases. No pleural effusion. Coronary artery atherosclerosis. Unchanged dilatation of the main pulmonary artery to 5 cm diameter as can be seen with pulmonary arterial hypertension. Hepatobiliary: No focal liver abnormality. Moderately distended gallbladder with possible punctate stone but no gallbladder wall thickening or other acute inflammatory changes. No biliary dilatation. Pancreas: Unremarkable. Spleen: Unchanged splenomegaly. Apparent decreased size of peripheral wedge-shaped hypodensity suggesting evolving infarct. Adrenals/Urinary Tract: Unremarkable adrenal glands. Unchanged 2.7 cm right lower pole renal cyst. Mild to moderate right hydroureteronephrosis has mildly improved. Left hydronephrosis on the prior study has resolved. Distended, thick-walled bladder containing multiple diverticula is similar to the prior study. Stomach/Bowel: The stomach is within normal limits. No bowel obstruction. Extensive descending and sigmoid colon diverticulosis without significant acute inflammatory changes identified within limitations of motion artifact. Scattered locules scratched at scattered small locules of gas in the right mid to upper abdomen are favored to be within decompressed small bowel loops rather than  extraluminal. The appendix is not well visualized, with motion artifact limiting assessment. No inflammatory changes are seen in the right lower quadrant to suggest acute appendicitis. Vascular/Lymphatic: Abdominal aortic atherosclerosis without aneurysm. No enlarged lymph nodes. Reproductive: At  most mild prostatic enlargement, unchanged. Other: No intraperitoneal free fluid. Unchanged 1.8 cm subcutaneous soft tissue nodule in the left inguinal region. Mild diffuse body wall edema. Musculoskeletal: Diffuse heterogeneous sclerosis throughout the visualized osseous structures, unchanged. IMPRESSION: 1. No acute abnormality identified in the abdomen or pelvis. 2. Mildly improved right hydroureteronephrosis. Resolved left hydronephrosis. 3. Unchanged distension of a thick-walled urinary bladder containing multiple diverticula. 4. Unchanged splenomegaly with evolving infarct as previously seen. 5. Unchanged diffusely abnormal appearance of the bones consistent with known myeloproliferative disorder. 6. Colonic diverticulosis. 7.  Aortic Atherosclerosis (ICD10-I70.0). Electronically Signed   By: Logan Bores M.D.   On: 08/03/2017 17:49        Scheduled Meds: . aspirin EC  81 mg Oral Daily  . enoxaparin (LOVENOX) injection  40 mg Subcutaneous Q24H  . insulin aspart  0-9 Units Subcutaneous TID WC  . tamsulosin  0.4 mg Oral QPC supper   Continuous Infusions: . ceFEPime (MAXIPIME) IV Stopped (08/04/17 0532)     LOS: 1 day        Aline August, MD [facility] Pager [phone]  If 7PM-7AM, please contact night-coverage [email or web address] Password [facility] 08/04/2017, 10:24 AM

## 2017-08-04 NOTE — Plan of Care (Signed)
Progressing

## 2017-08-04 NOTE — Consult Note (Signed)
Urology Consult Note   Requesting Attending Physician:  Aline August, MD Service Providing Consult: Urology  Consulting Attending: Dr. Alinda Money   Reason for Consult:  Urinary retention with UTI  HPI: Juan [name] is seen in consultation for reasons noted above at the request of Aline August, MD for evaluation of urinary retention and UTI.  This is a 77 y.o. male with a complex past medical history including CAD, CHF, CKD stage III, diabetes, and BPH who was recently discharged from hospital on 07/22/17 for a Pseudomonas UTI, treated with cefepime x 7 days.  Over the past several days, the patient has experienced worsening lower abdominal pain, dysuria, and left flank pain.  Denies fevers, chills, nausea, vomiting.  Does endorse reduced urine output.  He presented to the ED, where he was found with stable vitals, WBC 10.8 K, CR at baseline 1.18.  CT abdomen/pelvis showed mild right hydro, and resolved left hydro, in addition to a large capacity, thick-walled bladder with multiple large diverticula.  The patient was admitted for IV cefepime given history of Pseudomonas UTI and presence of flank pain on exam. A Foley catheter was placed given large bladder volume on CT. Urology was consulted for evaluation given recurrent UTIs, BPH, and a hostile-appearing bladder.  He is followed by Dr. Louis Meckel, last seen in 02/2017.  CT abdomen/pelvis in 05/2017 revealed bilateral mild hydronephrosis, as well as a thick-walled urinary bladder with bilateral large wide-mouth diverticula.   Past Medical History: Past Medical History:  Diagnosis Date  . Allergic rhinitis   . Anemia   . Ascending aortic aneurysm ([place]) 03/2014   4.3cm on CT scan  . CAD (coronary artery disease)    dx elsewheer in past, no documentation. Non-ischemic myoview 2007  . Chronic diastolic CHF (congestive heart failure), NYHA class 2 (HCC)    Normal EF w/ grade 1 dd by echo 12/2015  . Edema    R>L leg, u/s 5-12 neg for DVT  .  Hemorrhoid   . History of thrombocytosis   . Hypertension   . Iron deficiency anemia due to chronic blood loss 03/31/2017  . 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 Migraine    "once/wk at least" (07/11/2013)  . Myeloproliferative disease ([place])   . On home oxygen therapy    "2L; all the time" (05/03/2017)  . Pyelonephritis 04/29/2017  . Shortness of breath   . Sinus congestion   . Sleep apnea, obstructive    at some point used CPAP, was d/c  years ago  . Type II diabetes mellitus ([place])   . UTI (urinary tract infection) 06/2017  . UTI (urinary tract infection) 07/2017    Past Surgical History:  Past Surgical History:  Procedure Laterality Date  . FLEXIBLE SIGMOIDOSCOPY N/A 06/08/2017   Procedure: FLEXIBLE SIGMOIDOSCOPY;  Surgeon: Carol Ada, MD;  Location: [facility];  Service: Endoscopy;  Laterality: N/A;  . IR FLUORO GUIDE CV LINE RIGHT  05/07/2017  . IR FLUORO RM 30-60 MIN  05/07/2017  . IR US GUIDE VASC ACCESS RIGHT  05/07/2017  . TOE SURGERY Right    "tried to straighten out big toe" (07/11/2013)    Medication: Current Facility-Administered Medications  Medication Dose Route Frequency Provider Last Rate Last Dose  . acetaminophen (TYLENOL) tablet 650 mg  650 mg Oral Q6H PRN Emokpae, Ejiroghene E, MD   650 mg at 08/04/17 0429   Or  . acetaminophen (TYLENOL) suppository 650 mg  650 mg Rectal Q6H PRN Emokpae, Ejiroghene E, MD      .  aspirin EC tablet 81 mg  81 mg Oral Daily Emokpae, Ejiroghene E, MD   81 mg at 08/04/17 0901  . ceFEPIme (MAXIPIME) 1 g in sodium chloride 0.9 % 100 mL IVPB  1 g Intravenous Q8H Emokpae, Ejiroghene E, MD   Stopped at 08/04/17 1400  . enoxaparin (LOVENOX) injection 40 mg  40 mg Subcutaneous Q24H Emokpae, Ejiroghene E, MD   40 mg at 08/04/17 0901  . insulin aspart (novoLOG) injection 0-9 Units  0-9 Units Subcutaneous TID WC Emokpae, Ejiroghene E, MD      . ondansetron (ZOFRAN) tablet 4 mg  4 mg Oral Q6H PRN Emokpae, Ejiroghene E, MD       Or  .  ondansetron (ZOFRAN) injection 4 mg  4 mg Intravenous Q6H PRN Emokpae, Ejiroghene E, MD      . polyethylene glycol (MIRALAX / GLYCOLAX) packet 17 g  17 g Oral Daily PRN Emokpae, Ejiroghene E, MD      . tamsulosin (FLOMAX) capsule 0.4 mg  0.4 mg Oral QPC supper Emokpae, Ejiroghene E, MD   0.4 mg at 08/03/17 2318    Allergies: No Known Allergies  Social History: Social History   Tobacco Use  . Smoking status: Former Smoker    Packs/day: 0.25    Years: 12.00    Pack years: 3.00    Types: Cigarettes    Last attempt to quit: 05/18/1966    Years since quitting: 51.2  . Smokeless tobacco: Never Used  . Tobacco comment: quit smoking 45 years ago  Substance Use Topics  . Alcohol use: No    Alcohol/week: 0.0 oz  . Drug use: No    Family History Family History  Problem Relation Age of Onset  . Schizophrenia Son   . Mental illness Daughter   . Colon cancer Neg Hx   . Prostate cancer Neg Hx   . Heart attack Neg Hx   . Diabetes Neg Hx     Review of Systems 10 systems were reviewed and are negative except as noted specifically in the HPI.  Objective   Vital signs in last 24 hours: BP (!) 161/69 (BP Location: Right Arm)   Pulse 71   Temp 99.4 F (37.4 C) (Oral)   Resp 20   SpO2 90%   Physical Exam General: NAD, A&O, resting, appropriate HEENT: [place]/AT, EOMI, MMM Pulmonary: Normal work of breathing Cardiovascular: HDS, adequate peripheral perfusion Abdomen: Soft, NTTP, nondistended. GU: circumcised penis, Foley in place draining clear yellow urine Extremities: warm and well perfused Neuro: Appropriate, no focal neurological deficits  Most Recent Labs: Lab Results  Component Value Date   WBC 13.8 (H) 08/04/2017   HGB 8.7 (L) 08/04/2017   HCT 29.3 (L) 08/04/2017   PLT 243 08/04/2017    Lab Results  Component Value Date   NA 142 08/04/2017   K 4.3 08/04/2017   CL 109 08/04/2017   CO2 25 08/04/2017   BUN 52 (H) 08/04/2017   CREATININE 1.10 08/04/2017   CALCIUM 9.8  08/04/2017   MG 2.0 06/09/2017   PHOS 3.4 07/20/2017    Lab Results  Component Value Date   INR 1.23 06/08/2017   APTT 42 (H) 12/22/2016     IMAGING: Ct Abdomen Pelvis W Contrast  Result Date: 08/03/2017 CLINICAL DATA:  Lower abdominal pain. EXAM: CT ABDOMEN AND PELVIS WITH CONTRAST TECHNIQUE: Multidetector CT imaging of the abdomen and pelvis was performed using the standard protocol following bolus administration of intravenous contrast. CONTRAST:  113mL ISOVUE-300 IOPAMIDOL (  ISOVUE-300) INJECTION 61% COMPARISON:  06/07/2017 FINDINGS: Lower chest: Motion artifact and subsegmental atelectasis in the lung bases. No pleural effusion. Coronary artery atherosclerosis. Unchanged dilatation of the main pulmonary artery to 5 cm diameter as can be seen with pulmonary arterial hypertension. Hepatobiliary: No focal liver abnormality. Moderately distended gallbladder with possible punctate stone but no gallbladder wall thickening or other acute inflammatory changes. No biliary dilatation. Pancreas: Unremarkable. Spleen: Unchanged splenomegaly. Apparent decreased size of peripheral wedge-shaped hypodensity suggesting evolving infarct. Adrenals/Urinary Tract: Unremarkable adrenal glands. Unchanged 2.7 cm right lower pole renal cyst. Mild to moderate right hydroureteronephrosis has mildly improved. Left hydronephrosis on the prior study has resolved. Distended, thick-walled bladder containing multiple diverticula is similar to the prior study. Stomach/Bowel: The stomach is within normal limits. No bowel obstruction. Extensive descending and sigmoid colon diverticulosis without significant acute inflammatory changes identified within limitations of motion artifact. Scattered locules scratched at scattered small locules of gas in the right mid to upper abdomen are favored to be within decompressed small bowel loops rather than extraluminal. The appendix is not well visualized, with motion artifact limiting  assessment. No inflammatory changes are seen in the right lower quadrant to suggest acute appendicitis. Vascular/Lymphatic: Abdominal aortic atherosclerosis without aneurysm. No enlarged lymph nodes. Reproductive: At most mild prostatic enlargement, unchanged. Other: No intraperitoneal free fluid. Unchanged 1.8 cm subcutaneous soft tissue nodule in the left inguinal region. Mild diffuse body wall edema. Musculoskeletal: Diffuse heterogeneous sclerosis throughout the visualized osseous structures, unchanged. IMPRESSION: 1. No acute abnormality identified in the abdomen or pelvis. 2. Mildly improved right hydroureteronephrosis. Resolved left hydronephrosis. 3. Unchanged distension of a thick-walled urinary bladder containing multiple diverticula. 4. Unchanged splenomegaly with evolving infarct as previously seen. 5. Unchanged diffusely abnormal appearance of the bones consistent with known myeloproliferative disorder. 6. Colonic diverticulosis. 7.  Aortic Atherosclerosis (ICD10-I70.0). Electronically Signed   By: Logan Bores M.D.   On: 08/03/2017 17:49    ------  Assessment:  77 y.o. male with DM II, CHF on home 2L O2, CKD stage 3, pulmonary HTN, CVA, OSA on CPAP, BPH with urinary retention, here with a likely incompletely cleared PsA UTI.   I discussed with the patient that the etiology of his recurrent UTIs is likely incomplete bladder emptying related to bladder outlet obstruction, namely an enlarged prostate.  This is evidenced by a large capacity bladder with multiple diverticula, as well as the presence of bilateral hydronephrosis on prior scan.  We discussed steps moving forward including a bladder outlet procedure and/or intermittent catheterization to ensure bladder emptying.  For now, we will focus on clearing his infection with IV antibiotics and ensuring adequate drainage with a Foley catheter.  Recommendations: - Continue Flomax - Start finasteride 5mg  daily - Continue Foley catheter for 7d  given large PVR on CT. Will arrange follow up as an outpatient for trial of void, and discussion of possible need for intermittent catheterization and/or a bladder outlet procedure.  - Urology will continue to be available as needed. Please page with any questions or concerns.    Thank you for this consult. Please contact the urology consult pager with any further questions/concerns.

## 2017-08-04 NOTE — Progress Notes (Signed)
Juan [name] 77 y o m patient received from ED. Patient is alert and oriented x4. Vital signs are temp- 98, pulse-61, BP- 156/68 and resp- 18. Patient denies for pain. Skin assessment done with another nurse. Patient given instruction about call [facility],phone and unit routine. Bed in low position and call [facility] in reach.

## 2017-08-05 LAB — BASIC METABOLIC PANEL
ANION GAP: 7 (ref 5–15)
BUN: 44 mg/dL — ABNORMAL HIGH (ref 6–20)
CHLORIDE: 110 mmol/L (ref 101–111)
CO2: 24 mmol/L (ref 22–32)
Calcium: 9.3 mg/dL (ref 8.9–10.3)
Creatinine, Ser: 1.01 mg/dL (ref 0.61–1.24)
GFR calc Af Amer: >60 mL/min (ref >60)
Glucose, Bld: 96 mg/dL (ref 65–99)
POTASSIUM: 4.2 mmol/L (ref 3.5–5.1)
SODIUM: 141 mmol/L (ref 135–145)

## 2017-08-05 LAB — CBC WITH DIFFERENTIAL/PLATELET
BASOS PCT: 5 %
Basophils Absolute: 0.7 10*3/uL — ABNORMAL HIGH (ref 0.0–0.1)
EOS ABS: 0.3 10*3/uL (ref 0.0–0.7)
EOS PCT: 2 %
HCT: 26.2 % — ABNORMAL LOW (ref 39.0–52.0)
HEMOGLOBIN: 8.1 g/dL — AB (ref 13.0–17.0)
LYMPHS PCT: 18 %
Lymphs Abs: 2.4 10*3/uL (ref 0.7–4.0)
MCH: 27.5 pg (ref 26.0–34.0)
MCHC: 30.9 g/dL (ref 30.0–36.0)
MCV: 88.8 fL (ref 78.0–100.0)
Monocytes Absolute: 2 10*3/uL — ABNORMAL HIGH (ref 0.1–1.0)
Monocytes Relative: 15 %
NEUTROS PCT: 60 %
Neutro Abs: 7.8 10*3/uL — ABNORMAL HIGH (ref 1.7–7.7)
PLATELETS: 246 10*3/uL (ref 150–400)
RBC: 2.95 MIL/uL — AB (ref 4.22–5.81)
RDW: 16.8 % — ABNORMAL HIGH (ref 11.5–15.5)
WBC: 13.2 10*3/uL — AB (ref 4.0–10.5)

## 2017-08-05 LAB — GLUCOSE, CAPILLARY
GLUCOSE-CAPILLARY: 97 mg/dL (ref 65–99)
GLUCOSE-CAPILLARY: 102 mg/dL — AB (ref 65–99)
GLUCOSE-CAPILLARY: 111 mg/dL — AB (ref 65–99)
GLUCOSE-CAPILLARY: 118 mg/dL — AB (ref 65–99)

## 2017-08-05 LAB — MAGNESIUM: MAGNESIUM: 2.1 mg/dL (ref 1.7–2.4)

## 2017-08-05 LAB — URINE CULTURE: Culture: >=100000 — AB

## 2017-08-05 LAB — PATHOLOGIST SMEAR REVIEW

## 2017-08-05 NOTE — Progress Notes (Signed)
OT Cancellation Note  Patient Details Name: Juan [name] MRN: [id number] DOB: 12-17-1940   Cancelled Treatment:    Reason Eval/Treat Not Completed: Fatigue/lethargy limiting ability to participate following PT. Will see next day.  Malka So 08/05/2017, 4:31 PM  08/05/2017 Nestor Lewandowsky, OTR/L Pager: [phone]

## 2017-08-05 NOTE — Progress Notes (Signed)
Patient ID: Juan [name], male   DOB: 1940/09/03, 77 y.o.   MRN: [id number]  PROGRESS NOTE    Juan [name]  DZH:[id number] DOB: July 12, 1940 DOA: 08/03/2017 PCP: Colon Branch, MD   Brief Narrative:  77 year old male with history of diabetes mellitus, myeloproliferative disorder, diastolic CHF, chronic hypoxic respiratory failure on home oxygen, chronic kidney disease stage III, pulmonary hypertension, CVA, OSA, BPH with urinary retention, gout and recent discharge from hospital on 07/22/2017 for UTI secondary to Pseudomonas infection which was treated with intravenous antibiotics/cefepime for 7-day course presented on 08/03/2017 with dysuria and bilateral flank pain.  CT abdomen and pelvis showed right hydroureteronephrosis and resolved left hydronephrosis.  ED provider consulted with ID/Dr.Snyder on phone and patient was started on IV cefepime.   Assessment & Plan:   Active Problems:   Myeloproliferative disease (HCC)   Obstructive sleep apnea   Chronic diastolic CHF (congestive heart failure) (HCC)   BPH (benign prostatic hyperplasia)   CKD (chronic kidney disease) stage 3, GFR 30-59 ml/min (HCC)   Hydronephrosis   Type II diabetes mellitus (HCC)   Pulmonary hypertension severe   Hypertrophy of prostate with urinary retention   UTI (urinary tract infection)   Probable complicated UTI in a patient with recent Pseudomonas UTI -Patient has right hydroureteronephrosis and resolved left hydronephrosis along with distended urinary bladder -Spoke to Dr. Borden/urology who recommended placement of Foley catheter.  Urology recommended continuing Foley catheter. Patient will require IV antibiotics as such placed order for PICC line insertion. He needs to complete a 10-14 day treatment of antibiotics  Leukocytosis -Probably secondary to above. - continue IV antibiotic regimen.  DM2 - Most likely diet-controlled.  Not on oral meds at home -Accu-Cheks with insulin sliding scale coverage  Chronic  diastolic CHF - Currently compensated.  Lasix on hold because patient received contrast for CT abdomen yesterday - if a.m. creatinine remained stable, home Lasix can be restarted,  CKD stage 3 -Stable on admission.  Will repeat creatinine stat and in a.m.  BPH with history of urinary retention-  -Urology consulted -Continue home tamsulosin  DVT prophylaxis: Lovenox Code Status: Full Family Communication: None at bedside Disposition Plan: Depends on clinical outcome  Consultants: Urology  Procedures: None  Antimicrobials: Cefepime from 08/03/2017 onwards   Subjective: Pt has no new complaints.   Objective: Vitals:   08/04/17 1946 08/04/17 2017 08/05/17 0511 08/05/17 1412  BP:  (!) 147/58 (!) 151/58 (!) 146/62  Pulse:  68 63   Resp:  16 16 17   Temp:  99.1 F (37.3 C) 97.9 F (36.6 C) 98 F (36.7 C)  TempSrc:  Oral Oral Oral  SpO2:  94% 98% 99%  Weight: 81.1 kg (178 lb 12.7 oz)     Height: 6\' 3"  (1.905 m)       Intake/Output Summary (Last 24 hours) at 08/05/2017 1546 Last data filed at 08/05/2017 1500 Gross per 24 hour  Intake 1272 ml  Output 2550 ml  Net -1278 ml   Filed Weights   08/04/17 1946  Weight: 81.1 kg (178 lb 12.7 oz)    Examination:  General exam: Appears calm and comfortable.  No distress Respiratory system: Bilateral decreased breath sound at bases Cardiovascular system: S1 & S2 heard, rate controlled  gastrointestinal system: Abdomen is nondistended, soft. Normal bowel sounds heard.  Mild suprapubic tenderness and left CVA tenderness Central nervous system: Alert and oriented. No focal neurological deficits. Moving extremities Extremities: No cyanosis, clubbing; trace edema  skin: Chronic stasis changes in bilateral  lower extremities Psychiatry: Mood & affect appropriate.    Data Reviewed: I have personally reviewed following labs and imaging studies  CBC: Recent Labs  Lab 08/03/17 1153 08/04/17 1117 08/05/17 0530  WBC 16.8* 13.8*  13.2*  NEUTROABS  --  7.2 7.8*  HGB 9.9* 8.7* 8.1*  HCT 32.1* 29.3* 26.2*  MCV 88.7 88.8 88.8  PLT 297 243 053   Basic Metabolic Panel: Recent Labs  Lab 08/03/17 1153 08/04/17 1117 08/05/17 0530  NA 140 142 141  K 4.1 4.3 4.2  CL 107 109 110  CO2 24 25 24   GLUCOSE 102* 110* 96  BUN 57* 52* 44*  CREATININE 1.18 1.10 1.01  CALCIUM 9.9 9.8 9.3  MG  --   --  2.1   GFR: Estimated Creatinine Clearance: 72.5 mL/min (by C-G formula based on SCr of 1.01 mg/dL). Liver Function Tests: Recent Labs  Lab 08/03/17 1153  AST 23  ALT 16*  ALKPHOS 110  BILITOT 0.1*  PROT 6.1*  ALBUMIN 3.0*   Recent Labs  Lab 08/03/17 1153  LIPASE 27   No results for input(s): AMMONIA in the last 168 hours. Coagulation Profile: No results for input(s): INR, PROTIME in the last 168 hours. Cardiac Enzymes: No results for input(s): CKTOTAL, CKMB, CKMBINDEX, TROPONINI in the last 168 hours. BNP (last 3 results) No results for input(s): PROBNP in the last 8760 hours. HbA1C: No results for input(s): HGBA1C in the last 72 hours. CBG: Recent Labs  Lab 08/04/17 1246 08/04/17 1700 08/04/17 2055 08/05/17 0804 08/05/17 1224  GLUCAP 96 137* 98 97 102*   Lipid Profile: No results for input(s): CHOL, HDL, LDLCALC, TRIG, CHOLHDL, LDLDIRECT in the last 72 hours. Thyroid Function Tests: No results for input(s): TSH, T4TOTAL, FREET4, T3FREE, THYROIDAB in the last 72 hours. Anemia Panel: No results for input(s): VITAMINB12, FOLATE, FERRITIN, TIBC, IRON, RETICCTPCT in the last 72 hours. Sepsis Labs: No results for input(s): PROCALCITON, LATICACIDVEN in the last 168 hours.  Recent Results (from the past 240 hour(s))  Urine culture     Status: Abnormal   Collection Time: 08/03/17  4:20 PM  Result Value Ref Range Status   Specimen Description URINE, CLEAN CATCH  Final   Special Requests   Final    NONE Performed at [facility], [address] [place], [place] [address]    Culture >=100,000  COLONIES/mL PSEUDOMONAS AERUGINOSA (A)  Final   Report Status 08/05/2017 FINAL  Final   Organism ID, Bacteria PSEUDOMONAS AERUGINOSA (A)  Final      Susceptibility   Pseudomonas aeruginosa - MIC*    CEFTAZIDIME 4 SENSITIVE Sensitive     CIPROFLOXACIN >=4 RESISTANT Resistant     GENTAMICIN 4 SENSITIVE Sensitive     IMIPENEM 2 SENSITIVE Sensitive     PIP/TAZO 16 SENSITIVE Sensitive     CEFEPIME 8 SENSITIVE Sensitive     * >=100,000 COLONIES/mL PSEUDOMONAS AERUGINOSA  MRSA PCR Screening     Status: None   Collection Time: 08/03/17 10:43 PM  Result Value Ref Range Status   MRSA by PCR NEGATIVE NEGATIVE Final    Comment:        The GeneXpert MRSA Assay (FDA approved for NASAL specimens only), is one component of a comprehensive MRSA colonization surveillance program. It is not intended to diagnose MRSA infection nor to guide or monitor treatment for MRSA infections. Performed at [facility], [place] [address], [place], [place] [address]   Culture, blood (routine x 2)  Status: None (Preliminary result)   Collection Time: 08/04/17 11:15 AM  Result Value Ref Range Status   Specimen Description BLOOD RIGHT ANTECUBITAL  Final   Special Requests   Final    BOTTLES DRAWN AEROBIC AND ANAEROBIC Blood Culture adequate volume   Culture   Final    NO GROWTH 1 DAY Performed at [facility], [address], [place], [place] [address]    Report Status PENDING  Incomplete  Culture, blood (routine x 2)     Status: None (Preliminary result)   Collection Time: 08/04/17 11:30 AM  Result Value Ref Range Status   Specimen Description BLOOD RIGHT FOREARM  Final   Special Requests   Final    BOTTLES DRAWN AEROBIC AND ANAEROBIC Blood Culture adequate volume   Culture   Final    NO GROWTH 1 DAY Performed at [facility], [place] [address], [place],  [address]    Report Status PENDING  Incomplete         Radiology Studies: Ct Abdomen Pelvis W Contrast  Result Date:  08/03/2017 CLINICAL DATA:  Lower abdominal pain. EXAM: CT ABDOMEN AND PELVIS WITH CONTRAST TECHNIQUE: Multidetector CT imaging of the abdomen and pelvis was performed using the standard protocol following bolus administration of intravenous contrast. CONTRAST:  142mL ISOVUE-300 IOPAMIDOL (ISOVUE-300) INJECTION 61% COMPARISON:  06/07/2017 FINDINGS: Lower chest: Motion artifact and subsegmental atelectasis in the lung bases. No pleural effusion. Coronary artery atherosclerosis. Unchanged dilatation of the main pulmonary artery to 5 cm diameter as can be seen with pulmonary arterial hypertension. Hepatobiliary: No focal liver abnormality. Moderately distended gallbladder with possible punctate stone but no gallbladder wall thickening or other acute inflammatory changes. No biliary dilatation. Pancreas: Unremarkable. Spleen: Unchanged splenomegaly. Apparent decreased size of peripheral wedge-shaped hypodensity suggesting evolving infarct. Adrenals/Urinary Tract: Unremarkable adrenal glands. Unchanged 2.7 cm right lower pole renal cyst. Mild to moderate right hydroureteronephrosis has mildly improved. Left hydronephrosis on the prior study has resolved. Distended, thick-walled bladder containing multiple diverticula is similar to the prior study. Stomach/Bowel: The stomach is within normal limits. No bowel obstruction. Extensive descending and sigmoid colon diverticulosis without significant acute inflammatory changes identified within limitations of motion artifact. Scattered locules scratched at scattered small locules of gas in the right mid to upper abdomen are favored to be within decompressed small bowel loops rather than extraluminal. The appendix is not well visualized, with motion artifact limiting assessment. No inflammatory changes are seen in the right lower quadrant to suggest acute appendicitis. Vascular/Lymphatic: Abdominal aortic atherosclerosis without aneurysm. No enlarged lymph nodes. Reproductive: At  most mild prostatic enlargement, unchanged. Other: No intraperitoneal free fluid. Unchanged 1.8 cm subcutaneous soft tissue nodule in the left inguinal region. Mild diffuse body wall edema. Musculoskeletal: Diffuse heterogeneous sclerosis throughout the visualized osseous structures, unchanged. IMPRESSION: 1. No acute abnormality identified in the abdomen or pelvis. 2. Mildly improved right hydroureteronephrosis. Resolved left hydronephrosis. 3. Unchanged distension of a thick-walled urinary bladder containing multiple diverticula. 4. Unchanged splenomegaly with evolving infarct as previously seen. 5. Unchanged diffusely abnormal appearance of the bones consistent with known myeloproliferative disorder. 6. Colonic diverticulosis. 7.  Aortic Atherosclerosis (ICD10-I70.0). Electronically Signed   By: Logan Bores M.D.   On: 08/03/2017 17:49   Scheduled Meds: . aspirin EC  81 mg Oral Daily  . enoxaparin (LOVENOX) injection  40 mg Subcutaneous Q24H  . insulin aspart  0-9 Units Subcutaneous TID WC  . tamsulosin  0.4 mg Oral QPC supper   Continuous Infusions: .  ceFEPime (MAXIPIME) IV Stopped (08/05/17 1519)     LOS: 2 days    Velvet Bathe, MD [facility] Pager [phone]  If 7PM-7AM, please contact night-coverage [email or web address] Password [facility] 08/05/2017, 3:46 PM

## 2017-08-05 NOTE — Evaluation (Signed)
Physical Therapy Evaluation Patient Details Name: Juan [name] MRN: [id number] DOB: 03-20-1941 Today's Date: 08/05/2017   History of Present Illness  Pt adm with recurrent UTI. 77 year old male with PMH of CAD, chronic diastolic CHF, HTN, DM, OSA not on CPAP, chronic hypoxic respiratory failure on 2 L/min oxygen, severe pulmonary hypertension, AAA, MDS, iron deficiency anemia, recent hospitalization 2/19-2/23 for decompensated CHF. Pt also with recent adm for recurrent UTI.  Clinical Impression  Pt admitted with above diagnosis and presents to PT with functional limitations due to deficits listed below (See PT problem list). Pt needs skilled PT to maximize independence and safety to allow discharge to home with son. Pt very close to baseline.     Follow Up Recommendations No PT follow up    Equipment Recommendations  None recommended by PT    Recommendations for Other Services       Precautions / Restrictions Precautions Precautions: Fall      Mobility  Bed Mobility Overal bed mobility: Modified Independent Bed Mobility: Supine to Sit;Sit to Supine     Supine to sit: Modified independent (Device/Increase time);HOB elevated Sit to supine: Modified independent (Device/Increase time);HOB elevated   General bed mobility comments: Incr time  Transfers Overall transfer level: Needs assistance Equipment used: Rolling walker (2 wheeled) Transfers: Sit to/from Stand Sit to Stand: Supervision;From elevated surface         General transfer comment: Pt requesting very elevated bed before standing.  Ambulation/Gait Ambulation/Gait assistance: Supervision Ambulation Distance (Feet): 90 Feet Assistive device: Rolling walker (2 wheeled) Gait Pattern/deviations: Step-through pattern;Decreased stride length;Trunk flexed Gait velocity: decr Gait velocity interpretation: Below normal speed for age/gender General Gait Details: Supervision for safety  Stairs             Wheelchair Mobility    Modified Rankin (Stroke Patients Only)       Balance Overall balance assessment: Needs assistance Sitting-balance support: No upper extremity supported;Feet supported Sitting balance-Leahy Scale: Good     Standing balance support: Bilateral upper extremity supported Standing balance-Leahy Scale: Poor Standing balance comment: walker for support                             Pertinent Vitals/Pain Pain Assessment: No/denies pain    Home Living Family/patient expects to be discharged to:: Private residence Living Arrangements: Children Available Help at Discharge: Family;Available 24 hours/day Type of Home: Apartment Home Access: Level entry     Home Layout: One level Home Equipment: Walker - 2 wheels;Cane - single point;Shower seat;Bedside commode;Wheelchair - manual;Hospital bed Additional Comments: pt lives with his son    Prior Function Level of Independence: Needs assistance   Gait / Transfers Assistance Needed: Limited to short amb distances with RW or SPC. Intermittent use of w/c  ADL's / Homemaking Assistance Needed: Takes bird baths at sink. Son assists with ADLs as needed. Family drives him        Hand Dominance   Dominant Hand: Right    Extremity/Trunk Assessment   Upper Extremity Assessment Upper Extremity Assessment: Defer to OT evaluation    Lower Extremity Assessment Lower Extremity Assessment: Generalized weakness       Communication   Communication: No difficulties  Cognition Arousal/Alertness: Awake/alert Behavior During Therapy: WFL for tasks assessed/performed Overall Cognitive Status: Within Functional Limits for tasks assessed  General Comments      Exercises     Assessment/Plan    PT Assessment Patient needs continued PT services  PT Problem List Decreased strength;Decreased activity tolerance;Decreased balance;Decreased mobility        PT Treatment Interventions DME instruction;Gait training;Functional mobility training;Therapeutic activities;Therapeutic exercise;Balance training;Patient/family education    PT Goals (Current goals can be found in the Care Plan section)  Acute Rehab PT Goals Patient Stated Goal: not stated PT Goal Formulation: With patient Time For Goal Achievement: 08/12/17 Potential to Achieve Goals: Good    Frequency Min 3X/week   Barriers to discharge        Co-evaluation               AM-PAC PT "6 Clicks" Daily Activity  Outcome Measure Difficulty turning over in bed (including adjusting bedclothes, sheets and blankets)?: A Little Difficulty moving from lying on back to sitting on the side of the bed? : A Little Difficulty sitting down on and standing up from a chair with arms (e.g., wheelchair, bedside commode, etc,.)?: A Little Help needed moving to and from a bed to chair (including a wheelchair)?: A Little Help needed walking in hospital room?: A Little Help needed climbing 3-5 steps with a railing? : A Little 6 Click Score: 18    End of Session Equipment Utilized During Treatment: Gait belt Activity Tolerance: Patient tolerated treatment well Patient left: in bed;with call Prevost/phone within reach Nurse Communication: Mobility status PT Visit Diagnosis: Other abnormalities of gait and mobility (R26.89)    Time: 1550-1600 PT Time Calculation (min) (ACUTE ONLY): 10 min   Charges:   PT Evaluation $PT Eval Low Complexity: 1 Low     PT G CodesSuanne Marker PT [place] 08/05/2017, 5:35 PM

## 2017-08-06 ENCOUNTER — Inpatient Hospital Stay: Payer: Self-pay

## 2017-08-06 ENCOUNTER — Encounter (HOSPITAL_COMMUNITY): Payer: Self-pay

## 2017-08-06 LAB — GLUCOSE, CAPILLARY
GLUCOSE-CAPILLARY: 125 mg/dL — AB (ref 65–99)
Glucose-Capillary: 94 mg/dL (ref 65–99)
Glucose-Capillary: 126 mg/dL — ABNORMAL HIGH (ref 65–99)
Glucose-Capillary: 91 mg/dL (ref 65–99)

## 2017-08-06 MED ORDER — SODIUM CHLORIDE 0.9 % IV SOLN
1.0000 g | Freq: Once | INTRAVENOUS | Status: AC
  Administered 2017-08-06: 1 g via INTRAVENOUS
  Filled 2017-08-06: qty 1

## 2017-08-06 MED ORDER — SODIUM CHLORIDE 0.9 % IV SOLN
2.0000 g | Freq: Two times a day (BID) | INTRAVENOUS | Status: DC
  Administered 2017-08-06 – 2017-08-16 (×20): 2 g via INTRAVENOUS
  Filled 2017-08-06 (×23): qty 2

## 2017-08-06 MED ORDER — FLUTICASONE PROPIONATE 50 MCG/ACT NA SUSP
1.0000 | Freq: Every day | NASAL | Status: DC
  Administered 2017-08-06 – 2017-08-16 (×11): 1 via NASAL
  Filled 2017-08-06 (×2): qty 16

## 2017-08-06 MED ORDER — FERROUS GLUCONATE 324 (38 FE) MG PO TABS
324.0000 mg | ORAL_TABLET | Freq: Every day | ORAL | Status: DC
Start: 2017-08-06 — End: 2017-08-16
  Administered 2017-08-06 – 2017-08-16 (×11): 324 mg via ORAL
  Filled 2017-08-06 (×11): qty 1

## 2017-08-06 NOTE — Progress Notes (Signed)
Patient ID: Juan [name], male   DOB: [birth year], 77 y.o.   MRN: [id number]  PROGRESS NOTE    Juan [name]  UQJ:[id number] DOB: [birth year] DOA: 08/03/2017 PCP: Colon Branch, MD   Brief Narrative:  77 year old male with history of diabetes mellitus, myeloproliferative disorder, diastolic CHF, chronic hypoxic respiratory failure on home oxygen, chronic kidney disease stage III, pulmonary hypertension, CVA, OSA, BPH with urinary retention, gout and recent discharge from hospital on 07/22/2017 for UTI secondary to Pseudomonas infection which was treated with intravenous antibiotics/cefepime for 7-day course presented on 08/03/2017 with dysuria and bilateral flank pain.  CT abdomen and pelvis showed right hydroureteronephrosis and resolved left hydronephrosis.  ED provider consulted with ID/Dr.Snyder on phone and patient was started on IV cefepime.   Assessment & Plan:   Active Problems:   Myeloproliferative disease (HCC)   Obstructive sleep apnea   Chronic diastolic CHF (congestive heart failure) (HCC)   BPH (benign prostatic hyperplasia)   CKD (chronic kidney disease) stage 3, GFR 30-59 ml/min (HCC)   Hydronephrosis   Type II diabetes mellitus (HCC)   Pulmonary hypertension severe   Hypertrophy of prostate with urinary retention   UTI (urinary tract infection)   Probable complicated UTI in a patient with recent Pseudomonas UTI -Patient has right hydroureteronephrosis and resolved left hydronephrosis along with distended urinary bladder -Spoke to Dr. Borden/urology who recommended placement of Foley catheter.  Urology recommended continuing Foley catheter. Patient will require IV antibiotics as such placed order for PICC line insertion. He needs to complete a 10-14 day treatment of antibiotics - consulted care manager to assist, discussed with pharmacy and nursing.  Leukocytosis -Probably secondary to above. - continue IV antibiotic regimen. - cbc next am.  DM2 - Most likely diet-controlled.   Not on oral meds at home - Accu-Cheks with insulin sliding scale coverage  Chronic diastolic CHF - Currently compensated.  Lasix on hold because patient received contrast for CT abdomen yesterday - If a.m. creatinine remained stable, home Lasix can be restarted  CKD stage 3 -Stable on admission.  Will repeat creatinine stat and in a.m.  BPH with history of urinary retention-  - Urology consulted - Continue home tamsulosin  DVT prophylaxis: Lovenox Code Status: Full Family Communication: None at bedside Disposition Plan: Depends on clinical outcome  Consultants: Urology  Procedures: None  Antimicrobials: Cefepime from 08/03/2017 onwards  Subjective: No new complaints reported to me.  Objective: Vitals:   08/05/17 2052 08/06/17 0542 08/06/17 1000 08/06/17 1338  BP: (!) 144/69 (!) 146/71 (!) 149/56 (!) 151/68  Pulse: 70 77 (!) 58 (!) 57  Resp: 16 16 16 16   Temp: 98.1 F (36.7 C) 98.2 F (36.8 C)  98.2 F (36.8 C)  TempSrc: Oral Oral    SpO2: 93% 100% 100% 100%  Weight:      Height:        Intake/Output Summary (Last 24 hours) at 08/06/2017 1726 Last data filed at 08/06/2017 1445 Gross per 24 hour  Intake 820 ml  Output 2375 ml  Net -1555 ml   Filed Weights   08/04/17 1946  Weight: 81.1 kg (178 lb 12.7 oz)    Examination:  General exam: Appears calm and comfortable.  No distress Respiratory system: Bilateral decreased breath sound at bases Cardiovascular system: S1 & S2 heard, rate controlled  gastrointestinal system: Abdomen is nondistended, soft. Normal bowel sounds heard.  Mild suprapubic tenderness and left CVA tenderness Central nervous system: Alert and oriented. No focal neurological deficits. Moving extremities Extremities:  No cyanosis, clubbing; trace edema  Skin: Chronic stasis changes in bilateral lower extremities Psychiatry: Mood & affect appropriate.    Data Reviewed: I have personally reviewed following labs and imaging  studies  CBC: Recent Labs  Lab 08/03/17 1153 08/04/17 1117 08/05/17 0530  WBC 16.8* 13.8* 13.2*  NEUTROABS  --  7.2 7.8*  HGB 9.9* 8.7* 8.1*  HCT 32.1* 29.3* 26.2*  MCV 88.7 88.8 88.8  PLT 297 243 371   Basic Metabolic Panel: Recent Labs  Lab 08/03/17 1153 08/04/17 1117 08/05/17 0530  NA 140 142 141  K 4.1 4.3 4.2  CL 107 109 110  CO2 24 25 24   GLUCOSE 102* 110* 96  BUN 57* 52* 44*  CREATININE 1.18 1.10 1.01  CALCIUM 9.9 9.8 9.3  MG  --   --  2.1   GFR: Estimated Creatinine Clearance: 72.5 mL/min (by C-G formula based on SCr of 1.01 mg/dL). Liver Function Tests: Recent Labs  Lab 08/03/17 1153  AST 23  ALT 16*  ALKPHOS 110  BILITOT 0.1*  PROT 6.1*  ALBUMIN 3.0*   Recent Labs  Lab 08/03/17 1153  LIPASE 27   No results for input(s): AMMONIA in the last 168 hours. Coagulation Profile: No results for input(s): INR, PROTIME in the last 168 hours. Cardiac Enzymes: No results for input(s): CKTOTAL, CKMB, CKMBINDEX, TROPONINI in the last 168 hours. BNP (last 3 results) No results for input(s): PROBNP in the last 8760 hours. HbA1C: No results for input(s): HGBA1C in the last 72 hours. CBG: Recent Labs  Lab 08/05/17 1623 08/05/17 2049 08/06/17 0746 08/06/17 1144 08/06/17 1702  GLUCAP 111* 118* 94 126* 91   Lipid Profile: No results for input(s): CHOL, HDL, LDLCALC, TRIG, CHOLHDL, LDLDIRECT in the last 72 hours. Thyroid Function Tests: No results for input(s): TSH, T4TOTAL, FREET4, T3FREE, THYROIDAB in the last 72 hours. Anemia Panel: No results for input(s): VITAMINB12, FOLATE, FERRITIN, TIBC, IRON, RETICCTPCT in the last 72 hours. Sepsis Labs: No results for input(s): PROCALCITON, LATICACIDVEN in the last 168 hours.  Recent Results (from the past 240 hour(s))  Urine culture     Status: Abnormal   Collection Time: 08/03/17  4:20 PM  Result Value Ref Range Status   Specimen Description URINE, CLEAN CATCH  Final   Special Requests   Final     NONE Performed at [facility], [address], [place], [place] [address]    Culture >=100,000 COLONIES/mL PSEUDOMONAS AERUGINOSA (A)  Final   Report Status 08/05/2017 FINAL  Final   Organism ID, Bacteria PSEUDOMONAS AERUGINOSA (A)  Final      Susceptibility   Pseudomonas aeruginosa - MIC*    CEFTAZIDIME 4 SENSITIVE Sensitive     CIPROFLOXACIN >=4 RESISTANT Resistant     GENTAMICIN 4 SENSITIVE Sensitive     IMIPENEM 2 SENSITIVE Sensitive     PIP/TAZO 16 SENSITIVE Sensitive     CEFEPIME 8 SENSITIVE Sensitive     * >=100,000 COLONIES/mL PSEUDOMONAS AERUGINOSA  MRSA PCR Screening     Status: None   Collection Time: 08/03/17 10:43 PM  Result Value Ref Range Status   MRSA by PCR NEGATIVE NEGATIVE Final    Comment:        The GeneXpert MRSA Assay (FDA approved for NASAL specimens only), is one component of a comprehensive MRSA colonization surveillance program. It is not intended to diagnose MRSA infection nor to guide or monitor treatment for MRSA infections. Performed at [facility], [place]  [address] [place], [place] [address]   Culture, blood (routine x 2)     Status: None (Preliminary result)   Collection Time: 08/04/17 11:15 AM  Result Value Ref Range Status   Specimen Description BLOOD RIGHT ANTECUBITAL  Final   Special Requests   Final    BOTTLES DRAWN AEROBIC AND ANAEROBIC Blood Culture adequate volume   Culture   Final    NO GROWTH 2 DAYS Performed at [facility], [place] [address], [place], [place] [address]    Report Status PENDING  Incomplete  Culture, blood (routine x 2)     Status: None (Preliminary result)   Collection Time: 08/04/17 11:30 AM  Result Value Ref Range Status   Specimen Description BLOOD RIGHT FOREARM  Final   Special Requests   Final    BOTTLES DRAWN AEROBIC AND ANAEROBIC Blood Culture adequate volume   Culture   Final    NO GROWTH 2 DAYS Performed at [facility], [place] [address] [place],  [address]    Report  Status PENDING  Incomplete      Radiology Studies: [place] Ekg Site Rite  Result Date: 08/06/2017 If Site Rite image not attached, placement could not be confirmed due to current cardiac rhythm.  Scheduled Meds: . aspirin EC  81 mg Oral Daily  . enoxaparin (LOVENOX) injection  40 mg Subcutaneous Q24H  . ferrous gluconate  324 mg Oral Q breakfast  . fluticasone  1 spray Each Nare Daily  . insulin aspart  0-9 Units Subcutaneous TID WC  . tamsulosin  0.4 mg Oral QPC supper   Continuous Infusions: . ceFEPime (MAXIPIME) IV       LOS: 3 days    Velvet Bathe, MD [facility] Pager [phone]  If 7PM-7AM, please contact night-coverage [email or web address] Password [facility] 08/06/2017, 5:26 PM

## 2017-08-06 NOTE — Care Management Important Message (Signed)
Important Message  Patient Details  Name: Juan [name] MRN: [id number] Date of Birth: 1940/10/07   Medicare Important Message Given:  Yes    Orbie Pyo 08/06/2017, 2:20 PM

## 2017-08-06 NOTE — Care Management Note (Signed)
Case Management Note  Patient Details  Name: Juan [name] MRN: [id number] Date of Birth: [birth year]  Subjective/Objective:          Admitted with recurrent UTI/ right hydroureteronephrosis and resolved left hydronephrosis along with distended urinary bladder.Foley placed per urology. From home with son. DME: home oxygen.          PCP: [name]  Action/Plan: Patient will need PICC line insertion for a 10-14 day treatment of IV antibiotics. NCM made referral to CSW  SNF placement.Pt states he has no one to assist IV ABX therapy if he were to d/c to home....NCM will continue to monitor for transitional care needs.    Expected Discharge Date:                  Expected Discharge Plan:  Skilled Nursing Facility  In-House Referral:  Clinical Social Work  Discharge planning Services  CM Consult  Post Acute Care Choice:    Choice offered to:     DME Arranged:    DME Agency:     HH Arranged:    [place] Agency:     Status of Service:  In process, will continue to follow  If discussed at Long Length of Stay Meetings, dates discussed:    Additional Comments:  Sharin Mons, RN 08/06/2017, 1:58 PM

## 2017-08-06 NOTE — Progress Notes (Signed)
PHARMACY NOTE:  ANTIMICROBIAL RENAL DOSAGE ADJUSTMENT  Current antimicrobial regimen includes a mismatch between antimicrobial dosage and estimated renal function.  As per policy approved by the Pharmacy & Therapeutics and Medical Executive Committees, the antimicrobial dosage will be adjusted accordingly.  Current antimicrobial dosage:  Cefepime 1g IV q8h  Indication: pseudomonas UTI  Renal Function:  Estimated Creatinine Clearance: 72.5 mL/min (by C-G formula based on SCr of 1.01 mg/dL). []      On intermittent HD, scheduled: []      On CRRT    Antimicrobial dosage has been changed to:   Cefepime 2g IV q12h (will give cefepime 1g IV x1 now and then start 2g q12 this evening as patient received 1g already early this morning)   Additional comments: Asked to assist in finding a regimen that will be more manageable at home. Discussed with ID Pharmacist as well - concentrations will be high enough in urine to utilize cefepime 2g IV q12h dosing.   Please enter OPAT consult if needed.  Thank you for allowing pharmacy to be a part of this patient's care.  Ardie Mclennan D. Juvon Teater, PharmD, BCPS Clinical Pharmacist Clinical Phone for 08/06/2017 until 3:30pm: [phone] If after 3:30pm, please call main pharmacy at [phone] 08/06/2017 10:55 AM

## 2017-08-06 NOTE — Progress Notes (Addendum)
4:40pm-[name] states patient owes them $1700. Patient will have to pay $170/day if he wants to go to a different facility. Patient states he cannot afford it. Per CSW AD, we are unable to LOG pt since he has insurance. Pt states he lives with his son but he is disabled (would not tell CSW details) and not able to hang the bag of antibiotics. He reports his daughter is also unable to help him. RNCM aware.   3pm-Patient was recently at [facility] from 12-22 to 1-21 and is currently in copays days since he has not had a 60 day wellness period. [facility] checking to see if he owes them money or if they can take him anyway.   Percell Locus Shamere Dilworth LCSW [phone]

## 2017-08-06 NOTE — Evaluation (Signed)
Occupational Therapy Evaluation Patient Details Name: Juan [name] MRN: [id number] DOB: Jan 01, 1941 Today's Date: 08/06/2017    History of Present Illness Pt adm with recurrent UTI. 77 year old male with PMH of CAD, chronic diastolic CHF, HTN, DM, OSA not on CPAP, chronic hypoxic respiratory failure on 2 L/min oxygen, severe pulmonary hypertension, AAA, MDS, iron deficiency anemia, recent hospitalization 2/19-2/23 for decompensated CHF. Pt also with recent adm for recurrent UTI.   Clinical Impression   Pt admitted with the above diagnoses and presents with below problem list. Pt will benefit from continued acute OT to address the below listed deficits and maximize independence with basic ADLs prior to d/c to venue below. Per chart review, pt was mod I with functional mobility/transfers and lives with his son who assist as needed with ADLs.  Pt easily irritated during session. Decreased problem solving and some decreased safety awareness noted. "I only want to talk to my doctor about my (discharge) plans. I know what I want, I have a plan." When therapist explained OT's role in providing information to the MD for d/c planning pt responding, "Well, he didn't tell me you were coming. I want to talk to him first before I talk to you." Suggested that in the meantime pt consider what his long-term goal(s) are: returning home?, etc. Pt with head turned away from therapist. Pt did indicate earlier in the session that he does not want to d/c directly to home but wants to go to rehab at SNF first though did not state what he felt SNF could provide that he would not get at home. Tolerated household distance functional mobility well. No family present on eval. When asked if son was planning to come to hospital today pt stating, "you don't need to talk to my son."      Follow Up Recommendations  Home health OT;Supervision/Assistance - 24 hour    Equipment Recommendations  None recommended by OT    Recommendations  for Other Services       Precautions / Restrictions Precautions Precautions: Fall Restrictions Weight Bearing Restrictions: No      Mobility Bed Mobility Overal bed mobility: Modified Independent Bed Mobility: Supine to Sit;Sit to Supine     Supine to sit: Modified independent (Device/Increase time);HOB elevated Sit to supine: Modified independent (Device/Increase time);HOB elevated   General bed mobility comments: Incr time  Transfers Overall transfer level: Needs assistance Equipment used: Rolling walker (2 wheeled) Transfers: Sit to/from Stand Sit to Stand: Supervision;From elevated surface         General transfer comment: Pt requesting very elevated bed before standing.    Balance Overall balance assessment: Needs assistance Sitting-balance support: No upper extremity supported;Feet supported Sitting balance-Leahy Scale: Good     Standing balance support: Bilateral upper extremity supported Standing balance-Leahy Scale: Poor Standing balance comment: walker for support                           ADL either performed or assessed with clinical judgement   ADL Overall ADL's : Needs assistance/impaired Eating/Feeding: Set up;Sitting   Grooming: Set up;Sitting   Upper Body Bathing: Set up;Sitting   Lower Body Bathing: Min guard;Sit to/from stand;Minimal assistance   Upper Body Dressing : Set up;Sitting   Lower Body Dressing: Min guard;Minimal assistance;Sit to/from stand   Toilet Transfer: Min guard;Ambulation;RW   Toileting- Water quality scientist and Hygiene: Min guard;Sit to/from stand     Tub/Shower Transfer Details (indicate cue type and reason): sponge  bath at baseline per chart review Functional mobility during ADLs: Supervision/safety;Min guard;Rolling walker General ADL Comments: Pt stating that he does not want to do anything except walk "a little bit." supervision/min guard for safety. Seemed to tolerated household distance  functional mobility well.      Vision         Perception     Praxis      Pertinent Vitals/Pain Pain Assessment: Faces Faces Pain Scale: Hurts little more Pain Location: gesturing to abdomen Pain Descriptors / Indicators: Sore;Guarding Pain Intervention(s): Limited activity within patient's tolerance;Monitored during session;Repositioned     Hand Dominance Right   Extremity/Trunk Assessment Upper Extremity Assessment Upper Extremity Assessment: Generalized weakness   Lower Extremity Assessment Lower Extremity Assessment: Defer to PT evaluation       Communication Communication Communication: No difficulties   Cognition Arousal/Alertness: Awake/alert Behavior During Therapy: Agitated;Anxious;Flat affect Overall Cognitive Status: No family/caregiver present to determine baseline cognitive functioning                                 General Comments: Decreased safety awareness and problem solving. Easily irriated/overwhelmed. Perseverates.  General Comments       Exercises     Shoulder Instructions      Home Living Family/patient expects to be discharged to:: Private residence Living Arrangements: Children Available Help at Discharge: Family;Available 24 hours/day Type of Home: Apartment Home Access: Level entry     Home Layout: One level     Bathroom Shower/Tub: Teacher, early years/pre: Handicapped height Bathroom Accessibility: Yes   Home Equipment: Environmental consultant - 2 wheels;Cane - single point;Shower seat;Bedside commode;Wheelchair - manual;Hospital bed   Additional Comments: pt lives with his son. previous therapy notes indicate son assist with ADLs at home as needed. Of note, nurse student reported to OT that in her conversation with pt earlier he indicated his son is dasabled. No family present on OT eval. When asked if son was planning to come to hospital today pt stating, "you don't need to talk to my son."      Prior  Functioning/Environment Level of Independence: Needs assistance  Gait / Transfers Assistance Needed: Limited to short amb distances with RW or SPC. Intermittent use of w/c ADL's / Homemaking Assistance Needed: Takes bird baths at sink. Son assists with ADLs as needed. Family drives him            OT Problem List: Impaired balance (sitting and/or standing);Decreased activity tolerance;Decreased knowledge of use of DME or AE;Decreased safety awareness;Decreased cognition;Decreased knowledge of precautions;Pain      OT Treatment/Interventions: Self-care/ADL training;Therapeutic exercise;Energy conservation;DME and/or AE instruction;Therapeutic activities;Patient/family education;Balance training;Cognitive remediation/compensation    OT Goals(Current goals can be found in the care plan section) Acute Rehab OT Goals Patient Stated Goal: Reluctant to state other than expressed a desire not to go home "right away".  OT Goal Formulation: With patient Time For Goal Achievement: 08/20/17 Potential to Achieve Goals: Fair ADL Goals Pt Will Perform Grooming: with modified independence;standing;sitting Pt Will Perform Upper Body Bathing: with set-up;with supervision;sitting Pt Will Perform Lower Body Bathing: with min guard assist;sit to/from stand;with set-up Pt Will Perform Upper Body Dressing: with set-up;with supervision;sitting Pt Will Perform Lower Body Dressing: with set-up;with supervision;sit to/from stand Pt Will Transfer to Toilet: with modified independence;ambulating Pt Will Perform Toileting - Clothing Manipulation and hygiene: with modified independence;sitting/lateral leans;sit to/from stand  OT Frequency: Min 2X/week   Barriers to  D/C:    unsure what available assist level is, no family present on eval       Co-evaluation              AM-PAC PT "6 Clicks" Daily Activity     Outcome Measure Help from another person eating meals?: None Help from another person taking care  of personal grooming?: A Little Help from another person toileting, which includes using toliet, bedpan, or urinal?: A Little Help from another person bathing (including washing, rinsing, drying)?: A Little Help from another person to put on and taking off regular upper body clothing?: None Help from another person to put on and taking off regular lower body clothing?: A Little 6 Click Score: 20   End of Session Equipment Utilized During Treatment: Rolling walker Nurse Communication: Other (comment)(social history, cognition/behaviors)  Activity Tolerance: Patient tolerated treatment well Patient left: in bed;with call Hannon/phone within reach;with bed alarm set  OT Visit Diagnosis: Unsteadiness on feet (R26.81);Muscle weakness (generalized) (M62.81);Pain                Time: 3151-7616 OT Time Calculation (min): 22 min Charges:  OT General Charges $OT Visit: 1 Visit OT Evaluation $OT Eval Low Complexity: 1 Low G-Codes:       Hortencia Pilar 08/06/2017, 1:35 PM

## 2017-08-06 NOTE — Progress Notes (Signed)
PHARMACY CONSULT NOTE FOR:  OUTPATIENT  PARENTERAL ANTIBIOTIC THERAPY (OPAT)  Indication: recurrent complicated UTI (pseudomonas) Regimen: cefepime 2g IV Q 12 hrs End date: 08/16/2017  IV antibiotic discharge orders are pended. To discharging provider:  please sign these orders via discharge navigator,  Select New Orders & click on the button choice - Manage This Unsigned Work.     Thank you for allowing pharmacy to be a part of this patient's care.  Manley Mason 08/06/2017, 5:45 PM

## 2017-08-07 LAB — CBC
HEMATOCRIT: 28.1 % — AB (ref 39.0–52.0)
Hemoglobin: 8.5 g/dL — ABNORMAL LOW (ref 13.0–17.0)
MCH: 27 pg (ref 26.0–34.0)
MCHC: 30.2 g/dL (ref 30.0–36.0)
MCV: 89.2 fL (ref 78.0–100.0)
Platelets: 260 10*3/uL (ref 150–400)
RBC: 3.15 MIL/uL — ABNORMAL LOW (ref 4.22–5.81)
RDW: 16.9 % — AB (ref 11.5–15.5)
WBC: 16.1 10*3/uL — ABNORMAL HIGH (ref 4.0–10.5)

## 2017-08-07 LAB — GLUCOSE, CAPILLARY
GLUCOSE-CAPILLARY: 126 mg/dL — AB (ref 65–99)
Glucose-Capillary: 91 mg/dL (ref 65–99)
Glucose-Capillary: 85 mg/dL (ref 65–99)
Glucose-Capillary: 106 mg/dL — ABNORMAL HIGH (ref 65–99)

## 2017-08-07 MED ORDER — FUROSEMIDE 40 MG PO TABS
40.0000 mg | ORAL_TABLET | Freq: Two times a day (BID) | ORAL | Status: DC
  Administered 2017-08-07 – 2017-08-16 (×18): 40 mg via ORAL
  Filled 2017-08-07 (×18): qty 1

## 2017-08-07 MED ORDER — HYDRALAZINE HCL 25 MG PO TABS
25.0000 mg | ORAL_TABLET | Freq: Two times a day (BID) | ORAL | Status: DC
  Administered 2017-08-07: 25 mg via ORAL
  Filled 2017-08-07: qty 1

## 2017-08-07 MED ORDER — HYDRALAZINE HCL 25 MG PO TABS
25.0000 mg | ORAL_TABLET | Freq: Two times a day (BID) | ORAL | Status: DC
  Administered 2017-08-08 – 2017-08-16 (×17): 25 mg via ORAL
  Filled 2017-08-07 (×17): qty 1

## 2017-08-07 NOTE — Progress Notes (Signed)
Discussed with Nephrology Dr. Jonnie Finner who is ok with placing picc line in for treatment of active infection requiring prolonged antibiotic treatment.  Velvet Bathe MD

## 2017-08-07 NOTE — Progress Notes (Signed)
Patient asked to speak with his MD. Patient MD called the patient and repeatedly explained to the patient on the importance of getting PICC line placed as way of getting antibiotics. Patient stated while on the phone with the MD that he has an understanding for PICC line placement. MD ended the phone call with the patient. Immediately after hanging up with the MD the patient asked the staff to call his MD (urologist) and have him speak with the MD at [facility] to see if a PICC line is "absolutely necessary." the patient urologist on call called the floor back and stated that he was agreeable with whatever the MDs plans were here at [facility]. I informed the patient of what his outpatient urologist stated and the patient stated " I don't want to go home if I get that PICC like I know I can not stay here anymore. They are going to get rid of me."

## 2017-08-07 NOTE — Progress Notes (Signed)
Patient ID: Juan [name], male   DOB: Oct 31, 1940, 77 y.o.   MRN: [id number]  PROGRESS NOTE    Juan [name]  LEX:[id number] DOB: December 23, 1940 DOA: 08/03/2017 PCP: Colon Branch, MD   Brief Narrative:  77 year old male with history of diabetes mellitus, myeloproliferative disorder, diastolic CHF, chronic hypoxic respiratory failure on home oxygen, chronic kidney disease stage III, pulmonary hypertension, CVA, OSA, BPH with urinary retention, gout and recent discharge from hospital on 07/22/2017 for UTI secondary to Pseudomonas infection which was treated with intravenous antibiotics/cefepime for 7-day course presented on 08/03/2017 with dysuria and bilateral flank pain.  CT abdomen and pelvis showed right hydroureteronephrosis and resolved left hydronephrosis.  ED provider consulted with ID/Dr.Snyder on phone and patient was started on IV cefepime.  Pt will need Picc line for administration of prolonged IV antibiotics  Assessment & Plan:   Active Problems:   Myeloproliferative disease (HCC)   Obstructive sleep apnea   Chronic diastolic CHF (congestive heart failure) (HCC)   BPH (benign prostatic hyperplasia)   CKD (chronic kidney disease) stage 3, GFR 30-59 ml/min (HCC)   Hydronephrosis   Type II diabetes mellitus (HCC)   Pulmonary hypertension severe   Hypertrophy of prostate with urinary retention   UTI (urinary tract infection)   Probable complicated UTI in a patient with recent Pseudomonas UTI -Patient has right hydroureteronephrosis and resolved left hydronephrosis along with distended urinary bladder -Spoke to Dr. Borden/urology who recommended placement of Foley catheter.  Urology recommended continuing Foley catheter. Patient will require IV antibiotics as such placed order for PICC line insertion. He needs to complete a 10-14 day treatment of antibiotics - consulted care manager to assist, discussed with pharmacy and nursing. - PICC line order placed. I discussed importance of PICC line patient  initially refused but after I discussed the importance of PICC line with patient he agreed to getting the PICC line placed.  Leukocytosis -Probably secondary to above. - continue IV antibiotic regimen.  DM2 - Most likely diet-controlled.  Not on oral meds at home - Accu-Cheks with insulin sliding scale coverage  Chronic diastolic CHF - Currently compensated.  Lasix on hold because patient received contrast for CT abdomen yesterday - If a.m. creatinine remained stable, home Lasix can be restarted  CKD stage 3 -Stable on admission.  Will repeat creatinine stat and in a.m.  BPH with history of urinary retention-  - Urology consulted - Continue home tamsulosin  DVT prophylaxis: Lovenox Code Status: Full Family Communication: None at bedside Disposition Plan: Patient does not want to go home with prolonged IV antibiotic regimen given that he feels he is not able to administer or his son. He would prefer to transition to skilled nursing facility  Consultants: Urology  Procedures: None  Antimicrobials: Cefepime from 08/03/2017 onwards  Subjective: Patient has no new complaints. No acute issues overnight. Patient does report what is listed above his preference to go to skilled nursing facility.  Objective: Vitals:   08/06/17 1000 08/06/17 1338 08/06/17 2104 08/07/17 0512  BP: (!) 149/56 (!) 151/68 (!) 143/66 (!) 153/75  Pulse: (!) 58 (!) 57 63 61  Resp: 16 16 18 12   Temp:  98.2 F (36.8 C) 98.5 F (36.9 C) 98.1 F (36.7 C)  TempSrc:   Oral Oral  SpO2: 100% 100% 98% 100%  Weight:      Height:        Intake/Output Summary (Last 24 hours) at 08/07/2017 1539 Last data filed at 08/07/2017 1442 Gross per 24 hour  Intake -  Output 1700 ml  Net -1700 ml   Filed Weights   08/04/17 1946  Weight: 81.1 kg (178 lb 12.7 oz)    Examination:  General exam: Appears calm and comfortable.  No distress Respiratory system: Bilateral decreased breath sound at bases Cardiovascular  system: S1 & S2 heard, rate controlled  gastrointestinal system: Abdomen is nondistended, soft. Normal bowel sounds heard.  Mild suprapubic tenderness and left CVA tenderness Central nervous system: Alert and oriented. No focal neurological deficits. Moving extremities Extremities: No cyanosis, clubbing; trace edema  Skin: Chronic stasis changes in bilateral lower extremities Psychiatry: Mood & affect appropriate.    Data Reviewed: I have personally reviewed following labs and imaging studies  CBC: Recent Labs  Lab 08/03/17 1153 08/04/17 1117 08/05/17 0530 08/07/17 0038  WBC 16.8* 13.8* 13.2* 16.1*  NEUTROABS  --  7.2 7.8*  --   HGB 9.9* 8.7* 8.1* 8.5*  HCT 32.1* 29.3* 26.2* 28.1*  MCV 88.7 88.8 88.8 89.2  PLT 297 243 246 517   Basic Metabolic Panel: Recent Labs  Lab 08/03/17 1153 08/04/17 1117 08/05/17 0530  NA 140 142 141  K 4.1 4.3 4.2  CL 107 109 110  CO2 24 25 24   GLUCOSE 102* 110* 96  BUN 57* 52* 44*  CREATININE 1.18 1.10 1.01  CALCIUM 9.9 9.8 9.3  MG  --   --  2.1   GFR: Estimated Creatinine Clearance: 72.5 mL/min (by C-G formula based on SCr of 1.01 mg/dL). Liver Function Tests: Recent Labs  Lab 08/03/17 1153  AST 23  ALT 16*  ALKPHOS 110  BILITOT 0.1*  PROT 6.1*  ALBUMIN 3.0*   Recent Labs  Lab 08/03/17 1153  LIPASE 27   No results for input(s): AMMONIA in the last 168 hours. Coagulation Profile: No results for input(s): INR, PROTIME in the last 168 hours. Cardiac Enzymes: No results for input(s): CKTOTAL, CKMB, CKMBINDEX, TROPONINI in the last 168 hours. BNP (last 3 results) No results for input(s): PROBNP in the last 8760 hours. HbA1C: No results for input(s): HGBA1C in the last 72 hours. CBG: Recent Labs  Lab 08/06/17 1144 08/06/17 1702 08/06/17 2102 08/07/17 0749 08/07/17 1210  GLUCAP 126* 91 125* 91 126*   Lipid Profile: No results for input(s): CHOL, HDL, LDLCALC, TRIG, CHOLHDL, LDLDIRECT in the last 72 hours. Thyroid  Function Tests: No results for input(s): TSH, T4TOTAL, FREET4, T3FREE, THYROIDAB in the last 72 hours. Anemia Panel: No results for input(s): VITAMINB12, FOLATE, FERRITIN, TIBC, IRON, RETICCTPCT in the last 72 hours. Sepsis Labs: No results for input(s): PROCALCITON, LATICACIDVEN in the last 168 hours.  Recent Results (from the past 240 hour(s))  Urine culture     Status: Abnormal   Collection Time: 08/03/17  4:20 PM  Result Value Ref Range Status   Specimen Description URINE, CLEAN CATCH  Final   Special Requests   Final    NONE Performed at [facility], [address] [place], [place] [address]    Culture >=100,000 COLONIES/mL PSEUDOMONAS AERUGINOSA (A)  Final   Report Status 08/05/2017 FINAL  Final   Organism ID, Bacteria PSEUDOMONAS AERUGINOSA (A)  Final      Susceptibility   Pseudomonas aeruginosa - MIC*    CEFTAZIDIME 4 SENSITIVE Sensitive     CIPROFLOXACIN >=4 RESISTANT Resistant     GENTAMICIN 4 SENSITIVE Sensitive     IMIPENEM 2 SENSITIVE Sensitive     PIP/TAZO 16 SENSITIVE Sensitive     CEFEPIME 8 SENSITIVE Sensitive     * >=  100,000 COLONIES/mL PSEUDOMONAS AERUGINOSA  MRSA PCR Screening     Status: None   Collection Time: 08/03/17 10:43 PM  Result Value Ref Range Status   MRSA by PCR NEGATIVE NEGATIVE Final    Comment:        The GeneXpert MRSA Assay (FDA approved for NASAL specimens only), is one component of a comprehensive MRSA colonization surveillance program. It is not intended to diagnose MRSA infection nor to guide or monitor treatment for MRSA infections. Performed at [facility], [place] [address] [place], [place] [address]   Culture, blood (routine x 2)     Status: None (Preliminary result)   Collection Time: 08/04/17 11:15 AM  Result Value Ref Range Status   Specimen Description BLOOD RIGHT ANTECUBITAL  Final   Special Requests   Final    BOTTLES DRAWN AEROBIC AND ANAEROBIC Blood Culture adequate volume   Culture   Final    NO  GROWTH 3 DAYS Performed at [facility], [place] [address], [place], [place] [address]    Report Status PENDING  Incomplete  Culture, blood (routine x 2)     Status: None (Preliminary result)   Collection Time: 08/04/17 11:30 AM  Result Value Ref Range Status   Specimen Description BLOOD RIGHT FOREARM  Final   Special Requests   Final    BOTTLES DRAWN AEROBIC AND ANAEROBIC Blood Culture adequate volume   Culture   Final    NO GROWTH 3 DAYS Performed at [facility], [place] [address], [place], [place] [address]    Report Status PENDING  Incomplete      Radiology Studies: [place] Ekg Site Rite  Result Date: 08/06/2017 If Site Rite image not attached, placement could not be confirmed due to current cardiac rhythm.  Scheduled Meds: . aspirin EC  81 mg Oral Daily  . enoxaparin (LOVENOX) injection  40 mg Subcutaneous Q24H  . ferrous gluconate  324 mg Oral Q breakfast  . fluticasone  1 spray Each Nare Daily  . insulin aspart  0-9 Units Subcutaneous TID WC  . tamsulosin  0.4 mg Oral QPC supper   Continuous Infusions: . ceFEPime (MAXIPIME) IV Stopped (08/07/17 1358)     LOS: 4 days    Velvet Bathe, MD [facility] Pager [phone]  If 7PM-7AM, please contact night-coverage [email or web address] Password [facility] 08/07/2017, 3:39 PM

## 2017-08-07 NOTE — Progress Notes (Signed)
Spoke with Dr Wendee Beavers re nephrology approval for PICC placement d/t CKD3 and is seen by Nephrology as outpatient per pt.

## 2017-08-07 NOTE — Progress Notes (Signed)
2 IVT RN's spoke with pt at length re PICC placement. Pt concerned about d/c plan to home vs snf etc.  States son is unable to assist with IV infusions at home.  Refusing PICC at this time.  "I don't want to be cut on if I don't need to be" "They don't have a safe or good plan to deal with this medication problem yet".  Explained to pt that the PICC is for delivering the medication and will be appropriate for either place, home or snf, when he is ready for d/c and a good plan is in place.  Pt continues to refuse.  Ryan RN notified, also spoke with pt. Pt continues to refuse.  Dr Wendee Beavers notified.

## 2017-08-07 NOTE — Progress Notes (Addendum)
Spoke to patient and son [name] at the bedside. Described process of PICC placement and how PICC would be necessary for SNF (or home) for him to get his 10 days of IV Abx. Patient eventually agreed to PICC placement today. CM notified bedside  RN to contact IV team for placement.   Patient lives at home with his son who is questionable to have special needs, patient referring to son's vauge health history he would not elaborate on, nor would he let his son describe in room.  Spoke to son in hallway and asked him to speak with patient later today after he calms down to discuss the possibility of him going home w [facility] and letting him help with IV Abx. [name] stated he is willing to help.    Patient states there is no one that is able to help administer the IV abx at home as would be necessary as a [facility] RN would only visit 2-3 times a week.  Patient addiment about [facility]. However, [employer] states patient owes them $1700. Patient will have to pay $170/day if he wants to go to a different facility. Patient states he cannot afford it. Per CSW AD, we are unable to LOG pt since he has insurance.      Regimen: cefepime 2g IV Q 12 hrs End date: 08/16/2017

## 2017-08-07 NOTE — Progress Notes (Addendum)
Referral placed to [facility] for [facility] to see if he is a candidate. This would provide him with more extensive HH and support for home IV infusions.  14:41 [name] unable to accept patient into [facility] program or [facility] services as patient has refused [facility] care multiple times from them in the past.

## 2017-08-08 LAB — GLUCOSE, CAPILLARY
GLUCOSE-CAPILLARY: 146 mg/dL — AB (ref 65–99)
GLUCOSE-CAPILLARY: 104 mg/dL — AB (ref 65–99)
GLUCOSE-CAPILLARY: 131 mg/dL — AB (ref 65–99)
Glucose-Capillary: 86 mg/dL (ref 65–99)

## 2017-08-08 NOTE — Progress Notes (Signed)
Notified by CSW that pt has refused PICC line, per CSW in order for pt to go to SNF pt would have to pay out of pocket and is unable to do this. Spoke with [name] at [facility]- pt has been with them for [facility] services multiple times- and under [place] at times per conversation with [name] pt has not been complaint with Home Health- therefor at this time [name] will not consider pt for [facility] program and will assess pt once d/c needs known and caregiver support is verified for potential [facility] services/referral. Per MD note today plan is for IM injections for home- pharmacy has been consulted for recommendations on dosing of IM injections- weekday CM will follow up in AM for transition of care needs.

## 2017-08-08 NOTE — Progress Notes (Signed)
I spoke with the patient concerning the necessity for a PICC line in order to administer IV antibiotics to treat his pseudomonal UTI.  He voices understanding and is amendable to having the PICC line placed.  Ellison Hughs, MD Urology

## 2017-08-08 NOTE — Progress Notes (Signed)
Attempted once again today to obtain consent for PICC line.  Pt states he has "prayed on it" and has decided "its not the best thing for me".  Refusing PICC line again.  Dr Wendee Beavers notified.

## 2017-08-08 NOTE — Progress Notes (Signed)
Spoke with pt, continues to refuse to sign consent for PICC line.  Dr Wendee Beavers at bedside aware, to contact Urology to speak with pt re need for PICC as a second opinion.

## 2017-08-08 NOTE — Progress Notes (Signed)
Patient ID: Juan [name], male   DOB: 19-Apr-1941, 77 y.o.   MRN: [id number]  PROGRESS NOTE    Layla Gramm  RSW:[id number] DOB: June 21, 1940 DOA: 08/03/2017 PCP: Colon Branch, MD   Brief Narrative:  77 year old male with history of diabetes mellitus, myeloproliferative disorder, diastolic CHF, chronic hypoxic respiratory failure on home oxygen, chronic kidney disease stage III, pulmonary hypertension, CVA, OSA, BPH with urinary retention, gout and recent discharge from hospital on 07/22/2017 for UTI secondary to Pseudomonas infection which was treated with intravenous antibiotics/cefepime for 7-day course presented on 08/03/2017 with dysuria and bilateral flank pain.  CT abdomen and pelvis showed right hydroureteronephrosis and resolved left hydronephrosis.  ED provider consulted with ID/Dr.Snyder on phone and patient was started on IV cefepime.  Pt will need Picc line for administration of prolonged IV antibiotics  Assessment & Plan:   Active Problems:   Myeloproliferative disease (HCC)   Obstructive sleep apnea   Chronic diastolic CHF (congestive heart failure) (HCC)   BPH (benign prostatic hyperplasia)   CKD (chronic kidney disease) stage 3, GFR 30-59 ml/min (HCC)   Hydronephrosis   Type II diabetes mellitus (HCC)   Pulmonary hypertension severe   Hypertrophy of prostate with urinary retention   UTI (urinary tract infection)   Probable complicated UTI in a patient with recent Pseudomonas UTI -Patient has right hydroureteronephrosis and resolved left hydronephrosis along with distended urinary bladder -Spoke to Dr. Borden/urology who recommended placement of Foley catheter.  Urology recommended continuing Foley catheter. Patient will require IV antibiotics as such placed order for PICC line insertion. He needs to complete a 10-14 day treatment of antibiotics - consulted care manager to assist, discussed with pharmacy and nursing. - Patient has refused PICC line placement despite several attempts at  discussing importance of such device by myself, nursing, and urologist. As such I have consulted pharmacy in assisting me in deciding which antibiotic patient can administer intramuscularly. I will plan for discharge tomorrow as I feel patient is currently medically ready to be discharged and I feel that his avoidance to have a PICC line inserted may be secondary gain so that he does not go to his home for whatever reason.  - Again I reassured that I feel the patient is stable for medical discharge given that it is Sunday weights will tomorrow to have home health set up so that they can go to his home and administer medication as well as teach patient how to care for himself of which I believe patient is competent enough to learn.  Leukocytosis -Probably secondary to above. Reassess next am. Order placed. - continue IV antibiotic regimen. Transition to IM injections on d/c due to refusal of PICC line.   DM2 - Most likely diet-controlled.  Not on oral meds at home - Accu-Cheks with insulin sliding scale coverage  Chronic diastolic CHF - Currently compensated.  Lasix on hold because patient received contrast for CT abdomen yesterday - If a.m. creatinine remained stable, home Lasix can be restarted  CKD stage 3 -Stable on admission.  Will repeat creatinine stat and in a.m.  BPH with history of urinary retention-  - Urology consulted - Continue home tamsulosin  DVT prophylaxis: Lovenox Code Status: Full Family Communication: None at bedside Disposition Plan: Patient does not want to go home with prolonged IV antibiotic regimen. He would prefer to transition to skilled nursing facility.  Consultants: Urology  Procedures: None  Antimicrobials: Cefepime from 08/03/2017 onwards  Subjective: Pt refused Picc line placement. And reports that  he wants to transition to SNF on discharge.   Objective: Vitals:   08/06/17 2104 08/07/17 0512 08/07/17 2100 08/08/17 0634  BP: (!) 143/66 (!)  153/75 (!) 150/66 (!) 150/79  Pulse: 63 61 (!) 55 (!) 59  Resp: 18 12 17 18   Temp: 98.5 F (36.9 C) 98.1 F (36.7 C) 97.7 F (36.5 C) 97.6 F (36.4 C)  TempSrc: Oral Oral Oral Oral  SpO2: 98% 100% 100% 100%  Weight:      Height:        Intake/Output Summary (Last 24 hours) at 08/08/2017 1539 Last data filed at 08/08/2017 0058 Gross per 24 hour  Intake 200 ml  Output 800 ml  Net -600 ml   Filed Weights   08/04/17 1946  Weight: 81.1 kg (178 lb 12.7 oz)    Examination: Exam unchanged when compared to 08/07/2017  General exam: Appears calm and comfortable.  No distress Respiratory system: Bilateral decreased breath sound at bases Cardiovascular system: S1 & S2 heard, rate controlled  gastrointestinal system: Abdomen is nondistended, soft. Normal bowel sounds heard.  Mild suprapubic tenderness and left CVA tenderness Central nervous system: Alert and oriented. No focal neurological deficits. Moving extremities Extremities: No cyanosis, clubbing; trace edema  Skin: Chronic stasis changes in bilateral lower extremities Psychiatry: Mood & affect appropriate.    Data Reviewed: I have personally reviewed following labs and imaging studies  CBC: Recent Labs  Lab 08/03/17 1153 08/04/17 1117 08/05/17 0530 08/07/17 0038  WBC 16.8* 13.8* 13.2* 16.1*  NEUTROABS  --  7.2 7.8*  --   HGB 9.9* 8.7* 8.1* 8.5*  HCT 32.1* 29.3* 26.2* 28.1*  MCV 88.7 88.8 88.8 89.2  PLT 297 243 246 628   Basic Metabolic Panel: Recent Labs  Lab 08/03/17 1153 08/04/17 1117 08/05/17 0530  NA 140 142 141  K 4.1 4.3 4.2  CL 107 109 110  CO2 24 25 24   GLUCOSE 102* 110* 96  BUN 57* 52* 44*  CREATININE 1.18 1.10 1.01  CALCIUM 9.9 9.8 9.3  MG  --   --  2.1   GFR: Estimated Creatinine Clearance: 72.5 mL/min (by C-G formula based on SCr of 1.01 mg/dL). Liver Function Tests: Recent Labs  Lab 08/03/17 1153  AST 23  ALT 16*  ALKPHOS 110  BILITOT 0.1*  PROT 6.1*  ALBUMIN 3.0*   Recent Labs    Lab 08/03/17 1153  LIPASE 27   No results for input(s): AMMONIA in the last 168 hours. Coagulation Profile: No results for input(s): INR, PROTIME in the last 168 hours. Cardiac Enzymes: No results for input(s): CKTOTAL, CKMB, CKMBINDEX, TROPONINI in the last 168 hours. BNP (last 3 results) No results for input(s): PROBNP in the last 8760 hours. HbA1C: No results for input(s): HGBA1C in the last 72 hours. CBG: Recent Labs  Lab 08/07/17 1210 08/07/17 1714 08/07/17 2119 08/08/17 0801 08/08/17 1206  GLUCAP 126* 85 106* 86 146*   Lipid Profile: No results for input(s): CHOL, HDL, LDLCALC, TRIG, CHOLHDL, LDLDIRECT in the last 72 hours. Thyroid Function Tests: No results for input(s): TSH, T4TOTAL, FREET4, T3FREE, THYROIDAB in the last 72 hours. Anemia Panel: No results for input(s): VITAMINB12, FOLATE, FERRITIN, TIBC, IRON, RETICCTPCT in the last 72 hours. Sepsis Labs: No results for input(s): PROCALCITON, LATICACIDVEN in the last 168 hours.  Recent Results (from the past 240 hour(s))  Urine culture     Status: Abnormal   Collection Time: 08/03/17  4:20 PM  Result Value Ref Range Status  Specimen Description URINE, CLEAN CATCH  Final   Special Requests   Final    NONE Performed at [facility], [place] [address], [place], [place] [address]    Culture >=100,000 COLONIES/mL PSEUDOMONAS AERUGINOSA (A)  Final   Report Status 08/05/2017 FINAL  Final   Organism ID, Bacteria PSEUDOMONAS AERUGINOSA (A)  Final      Susceptibility   Pseudomonas aeruginosa - MIC*    CEFTAZIDIME 4 SENSITIVE Sensitive     CIPROFLOXACIN >=4 RESISTANT Resistant     GENTAMICIN 4 SENSITIVE Sensitive     IMIPENEM 2 SENSITIVE Sensitive     PIP/TAZO 16 SENSITIVE Sensitive     CEFEPIME 8 SENSITIVE Sensitive     * >=100,000 COLONIES/mL PSEUDOMONAS AERUGINOSA  MRSA PCR Screening     Status: None   Collection Time: 08/03/17 10:43 PM  Result Value Ref Range Status   MRSA by PCR NEGATIVE NEGATIVE Final     Comment:        The GeneXpert MRSA Assay (FDA approved for NASAL specimens only), is one component of a comprehensive MRSA colonization surveillance program. It is not intended to diagnose MRSA infection nor to guide or monitor treatment for MRSA infections. Performed at [facility], [place] [address], [place], [place] [address]   Culture, blood (routine x 2)     Status: None (Preliminary result)   Collection Time: 08/04/17 11:15 AM  Result Value Ref Range Status   Specimen Description BLOOD RIGHT ANTECUBITAL  Final   Special Requests   Final    BOTTLES DRAWN AEROBIC AND ANAEROBIC Blood Culture adequate volume   Culture   Final    NO GROWTH 4 DAYS Performed at [facility], [place] [address], [place], [place] [address]    Report Status PENDING  Incomplete  Culture, blood (routine x 2)     Status: None (Preliminary result)   Collection Time: 08/04/17 11:30 AM  Result Value Ref Range Status   Specimen Description BLOOD RIGHT FOREARM  Final   Special Requests   Final    BOTTLES DRAWN AEROBIC AND ANAEROBIC Blood Culture adequate volume   Culture   Final    NO GROWTH 4 DAYS Performed at [facility], [place] [address], [place], [place] [address]    Report Status PENDING  Incomplete      Radiology Studies: No results found. Scheduled Meds: . aspirin EC  81 mg Oral Daily  . enoxaparin (LOVENOX) injection  40 mg Subcutaneous Q24H  . ferrous gluconate  324 mg Oral Q breakfast  . fluticasone  1 spray Each Nare Daily  . furosemide  40 mg Oral BID  . hydrALAZINE  25 mg Oral BID  . insulin aspart  0-9 Units Subcutaneous TID WC  . tamsulosin  0.4 mg Oral QPC supper   Continuous Infusions: . ceFEPime (MAXIPIME) IV Stopped (08/08/17 1107)     LOS: 5 days    Velvet Bathe, MD [facility] Pager [phone]  If 7PM-7AM, please contact night-coverage [email or web address] Password [facility] 08/08/2017, 3:39 PM

## 2017-08-08 NOTE — Progress Notes (Signed)
Patient states " I prayed about the PICC line and decided that he does not want it."

## 2017-08-08 NOTE — Progress Notes (Addendum)
CSW received consult from Kathleen.  CSW spoke with AD Zack concerning PICC line for pt.  CSW spoke with [facility] Kristi to see if pt can possible take antibiotic in pill form.  CMRN Steffanie Dunn will speak with RN and physician.  Reed Breech LCSWA [phone]

## 2017-08-09 LAB — CBC
HCT: 29.5 % — ABNORMAL LOW (ref 39.0–52.0)
Hemoglobin: 8.8 g/dL — ABNORMAL LOW (ref 13.0–17.0)
MCH: 26.6 pg (ref 26.0–34.0)
MCHC: 29.8 g/dL — AB (ref 30.0–36.0)
MCV: 89.1 fL (ref 78.0–100.0)
PLATELETS: 308 10*3/uL (ref 150–400)
RBC: 3.31 MIL/uL — ABNORMAL LOW (ref 4.22–5.81)
RDW: 17.1 % — AB (ref 11.5–15.5)
WBC: 18.2 10*3/uL — ABNORMAL HIGH (ref 4.0–10.5)

## 2017-08-09 LAB — CULTURE, BLOOD (ROUTINE X 2)
CULTURE: NO GROWTH
Culture: NO GROWTH
SPECIAL REQUESTS: ADEQUATE
SPECIAL REQUESTS: ADEQUATE

## 2017-08-09 LAB — BASIC METABOLIC PANEL
Anion gap: 6 (ref 5–15)
BUN: 43 mg/dL — ABNORMAL HIGH (ref 6–20)
CALCIUM: 9.7 mg/dL (ref 8.9–10.3)
CO2: 25 mmol/L (ref 22–32)
CREATININE: 1.11 mg/dL (ref 0.61–1.24)
Chloride: 110 mmol/L (ref 101–111)
GFR calc non Af Amer: >60 mL/min (ref >60)
Glucose, Bld: 96 mg/dL (ref 65–99)
Potassium: 4.7 mmol/L (ref 3.5–5.1)
SODIUM: 141 mmol/L (ref 135–145)

## 2017-08-09 LAB — GLUCOSE, CAPILLARY
GLUCOSE-CAPILLARY: 95 mg/dL (ref 65–99)
GLUCOSE-CAPILLARY: 141 mg/dL — AB (ref 65–99)
Glucose-Capillary: 95 mg/dL (ref 65–99)
Glucose-Capillary: 99 mg/dL (ref 65–99)

## 2017-08-09 NOTE — Progress Notes (Signed)
Physical Therapy Treatment Patient Details Name: Juan [name] MRN: [id number] DOB: August 11, 1940 Today's Date: 08/09/2017    History of Present Illness Pt adm with recurrent UTI. 77 year old male with PMH of CAD, chronic diastolic CHF, HTN, DM, OSA not on CPAP, chronic hypoxic respiratory failure on 2 L/min oxygen, severe pulmonary hypertension, AAA, MDS, iron deficiency anemia, recent hospitalization 2/19-2/23 for decompensated CHF. Pt also with recent adm for recurrent UTI.    PT Comments    Pt is self limiting in his therapy session today, only agreeing to ambulate the same distance he had during last session.  Pt is still supervision for bed mobility, transfers and ambulation of 90 feet with RW. Discharge home still remains appropriate from a mobility standpoint. PT will continue to follow acutely until d/c.   Follow Up Recommendations  No PT follow up     Equipment Recommendations  None recommended by PT    Recommendations for Other Services       Precautions / Restrictions Precautions Precautions: Fall Restrictions Weight Bearing Restrictions: No    Mobility  Bed Mobility Overal bed mobility: Modified Independent Bed Mobility: Supine to Sit;Sit to Supine     Supine to sit: Modified independent (Device/Increase time);HOB elevated Sit to supine: Modified independent (Device/Increase time);HOB elevated   General bed mobility comments: Incr time  Transfers Overall transfer level: Needs assistance Equipment used: Rolling walker (2 wheeled) Transfers: Sit to/from Stand Sit to Stand: Supervision;From elevated surface         General transfer comment: Pt requesting very elevated bed before standing.  Ambulation/Gait Ambulation/Gait assistance: Supervision Ambulation Distance (Feet): 90 Feet Assistive device: Rolling walker (2 wheeled) Gait Pattern/deviations: Step-through pattern;Decreased stride length;Trunk flexed Gait velocity: decr Gait velocity interpretation:  Below normal speed for age/gender General Gait Details: Supervision for safety     Balance Overall balance assessment: Needs assistance Sitting-balance support: No upper extremity supported;Feet supported Sitting balance-Leahy Scale: Good     Standing balance support: Bilateral upper extremity supported Standing balance-Leahy Scale: Poor Standing balance comment: walker for support during standing pericare                            Cognition Arousal/Alertness: Awake/alert Behavior During Therapy: WFL for tasks assessed/performed Overall Cognitive Status: Within Functional Limits for tasks assessed                                           General Comments General comments (skin integrity, edema, etc.): Upon getting up pt was noted to have had bowel movement, sat on EoB without assist until cleaning supplies could be gathered, able to stand with RW support for pericare, pt reports he did not know he had a bowel movement      Pertinent Vitals/Pain Pain Assessment: No/denies pain Faces Pain Scale: Hurts even more Pain Location: gesturing to abdomen Pain Descriptors / Indicators: Sore;Guarding Pain Intervention(s): Limited activity within patient's tolerance;Monitored during session           PT Goals (current goals can now be found in the care plan section) Acute Rehab PT Goals Patient Stated Goal: not stated PT Goal Formulation: With patient Time For Goal Achievement: 08/12/17 Potential to Achieve Goals: Good Progress towards PT goals: Not progressing toward goals - comment(pt only willing to do exactly what he did in previous sessio)    Frequency  Min 3X/week      PT Plan Current plan remains appropriate       AM-PAC PT "6 Clicks" Daily Activity  Outcome Measure  Difficulty turning over in bed (including adjusting bedclothes, sheets and blankets)?: A Little Difficulty moving from lying on back to sitting on the side of the bed?  : A Little Difficulty sitting down on and standing up from a chair with arms (e.g., wheelchair, bedside commode, etc,.)?: A Little Help needed moving to and from a bed to chair (including a wheelchair)?: A Little Help needed walking in hospital room?: A Little Help needed climbing 3-5 steps with a railing? : A Little 6 Click Score: 18    End of Session Equipment Utilized During Treatment: Gait belt Activity Tolerance: Patient tolerated treatment well Patient left: in bed;with call [place]/phone within reach Nurse Communication: Mobility status PT Visit Diagnosis: Other abnormalities of gait and mobility (R26.89)     Time: 7353-2992 PT Time Calculation (min) (ACUTE ONLY): 25 min  Charges:  $Gait Training: 8-22 mins $Therapeutic Activity: 8-22 mins                    G Codes:       Aamya Orellana B. Migdalia Dk PT, DPT Acute Rehabilitation  [phone] Pager [phone]     [place] 08/09/2017, 5:00 PM

## 2017-08-09 NOTE — Progress Notes (Signed)
Occupational Therapy Treatment Patient Details Name: Kc Summerson MRN: [id number] DOB: 1940-09-18 Today's Date: 08/09/2017    History of present illness Pt adm with recurrent UTI. 77 year old male with PMH of CAD, chronic diastolic CHF, HTN, DM, OSA not on CPAP, chronic hypoxic respiratory failure on 2 L/min oxygen, severe pulmonary hypertension, AAA, MDS, iron deficiency anemia, recent hospitalization 2/19-2/23 for decompensated CHF. Pt also with recent adm for recurrent UTI.   OT comments  Pt supine in bed upon entering the room and required maximal encouragement for participation this session. Pt declined to exit the bed but agreeable to aerobic exercise at bed level. Pt refused use of resistive theraband but moving B UE's against gravity for alternating punches, shoulder elevation , and rowing exercises. Pt requiring frequent rest breaks and encouragement. OT providing education to pt regarding importance of mobility and exercise in order to improve occupational performance. Pt verbalized understanding.    Follow Up Recommendations    SNF, 24/7 supervision         Precautions / Restrictions Precautions Precautions: Fall Restrictions Weight Bearing Restrictions: No       Mobility Bed Mobility      General bed mobility comments: supine in bed upon arrival         ADL either performed or assessed with clinical judgement                  Cognition Arousal/Alertness: Awake/alert Behavior During Therapy: Agitated;Anxious;Flat affect Overall Cognitive Status: No family/caregiver present to determine baseline cognitive functioning                      Pertinent Vitals/ Pain       Pain Assessment: Faces Faces Pain Scale: Hurts even more Pain Location: gesturing to abdomen Pain Descriptors / Indicators: Sore;Guarding Pain Intervention(s): Limited activity within patient's tolerance;Monitored during session         Frequency  Min 2X/week        Progress  Toward Goals  OT Goals(current goals can now be found in the care plan section)  Progress towards OT goals: Progressing toward goals  Acute Rehab OT Goals Patient Stated Goal: no goals stated Time For Goal Achievement: 08/23/17 Potential to Achieve Goals: [place] Discharge plan remains appropriate       AM-PAC PT "6 Clicks" Daily Activity     Outcome Measure   Help from another person eating meals?: None Help from another person taking care of personal grooming?: A Little Help from another person toileting, which includes using toliet, bedpan, or urinal?: A Little Help from another person bathing (including washing, rinsing, drying)?: A Little Help from another person to put on and taking off regular upper body clothing?: None Help from another person to put on and taking off regular lower body clothing?: A Little 6 Click Score: 20    End of Session    OT Visit Diagnosis: Unsteadiness on feet (R26.81);Muscle weakness (generalized) (M62.81);Pain   Activity Tolerance Patient limited by pain   Patient Left in bed;with call [place]/phone within reach;with bed alarm set   Nurse Communication          Time: 1343-1401 OT Time Calculation (min): 18 min  Charges: OT General Charges $OT Visit: 1 Visit OT Treatments $Therapeutic Activity: 8-22 mins   Gypsy Decant 08/09/2017, 2:11 PM

## 2017-08-09 NOTE — Progress Notes (Signed)
Patient ID: Juan [name], male   DOB: 20-Feb-1941, 77 y.o.   MRN: [id number]  PROGRESS NOTE    Juan [name]  [id number]:[id number] DOB: 1940/09/09 DOA: 08/03/2017 PCP: Colon Branch, MD   Brief Narrative:  77 year old male with history of diabetes mellitus, myeloproliferative disorder, diastolic CHF, chronic hypoxic respiratory failure on home oxygen, chronic kidney disease stage III, pulmonary hypertension, CVA, OSA, BPH with urinary retention, gout and recent discharge from hospital on 07/22/2017 for UTI secondary to Pseudomonas infection which was treated with intravenous antibiotics/cefepime for 7-day course presented on 08/03/2017 with dysuria and bilateral flank pain.  CT abdomen and pelvis showed right hydroureteronephrosis and resolved left hydronephrosis.  ED provider consulted with ID/Dr.Snyder on phone and patient was started on IV cefepime.  Patient reports that God has directed him not to get a PICC line placed. He states that he has a special needs son at home who will not be able to help him get treatment. He is saying that he cant care for himself at home and as such would like to transition to SNF or stay in the hospital for full treatment of his current infection  Assessment & Plan:   Active Problems:   Myeloproliferative disease (HCC)   Obstructive sleep apnea   Chronic diastolic CHF (congestive heart failure) (HCC)   BPH (benign prostatic hyperplasia)   CKD (chronic kidney disease) stage 3, GFR 30-59 ml/min (HCC)   Hydronephrosis   Type II diabetes mellitus (HCC)   Pulmonary hypertension severe   Hypertrophy of prostate with urinary retention   UTI (urinary tract infection)   Probable complicated UTI in a patient with recent Pseudomonas UTI -Patient has right hydroureteronephrosis and resolved left hydronephrosis along with distended urinary bladder -Spoke to Dr. Borden/urology who recommended placement of Foley catheter.  Urology recommended continuing Foley catheter. Patient will require  IV antibiotics as such placed order for PICC line insertion. He needs to complete a 10-14 day treatment of antibiotics - consulted care manager to assist, discussed with pharmacy and nursing. - Patient has refused PICC line placement despite several attempts at discussing importance of such device by myself, nursing, and urologist.  - Discussed with case manager and social worker situation for best option given patients social condition and medical needs.   Leukocytosis -Probably secondary to above. Pt currently on appropriate antibiotic regimen. Will aim for longer duration of antibiotic therapy 14 days total currently at 7/14  DM2 - Most likely diet-controlled.  Not on oral meds at home - Accu-Cheks with insulin sliding scale coverage  Chronic diastolic CHF - Currently compensated.  Lasix on hold because patient received contrast for CT abdomen yesterday - If a.m. creatinine remained stable, home Lasix can be restarted  CKD stage 3 -Stable on admission.  Will repeat creatinine stat and in a.m.  BPH with history of urinary retention-  - Urology consulted - Continue home tamsulosin  DVT prophylaxis: Lovenox Code Status: Full Family Communication: None at bedside Disposition Plan: Patient does not want to go home with prolonged IV antibiotic regimen. He would prefer to transition to skilled nursing facility or stay at hospital. Medically ready to transition out of hospital problem is social situation which limits effectiveness of him receiving needed antibiotic therapy  Consultants: Urology  Procedures: None  Antimicrobials: Cefepime from 08/03/2017 onwards  Subjective: Pt prayed and GOD told him obtaining a picc line was not a good idea.  Objective: Vitals:   08/08/17 1608 08/08/17 2100 08/09/17 0438 08/09/17 1303  BP: (!) 158/60 Marland Kitchen)  143/61 (!) 131/52 (!) 144/60  Pulse: (!) 57 (!) 58 60 62  Resp: 16 17 18 20   Temp: 98.2 F (36.8 C) 97.7 F (36.5 C) 97.8 F (36.6 C)  97.8 F (36.6 C)  TempSrc: Oral Axillary Oral Oral  SpO2: 100% 100% 100% 98%  Weight:      Height:        Intake/Output Summary (Last 24 hours) at 08/09/2017 1428 Last data filed at 08/09/2017 1406 Gross per 24 hour  Intake 980 ml  Output 2375 ml  Net -1395 ml   Filed Weights   08/04/17 1946  Weight: 81.1 kg (178 lb 12.7 oz)    Examination: Exam unchanged when compared to 08/08/2017  General exam: Appears calm and comfortable.  No distress Respiratory system: Bilateral decreased breath sound at bases Cardiovascular system: S1 & S2 heard, rate controlled  gastrointestinal system: Abdomen is nondistended, soft. Normal bowel sounds heard.  Mild suprapubic tenderness and left CVA tenderness Central nervous system: Alert and oriented. No focal neurological deficits. Moving extremities Extremities: No cyanosis, clubbing; trace edema  Skin: Chronic stasis changes in bilateral lower extremities Psychiatry: Mood & affect appropriate.    Data Reviewed: I have personally reviewed following labs and imaging studies  CBC: Recent Labs  Lab 08/03/17 1153 08/04/17 1117 08/05/17 0530 08/07/17 0038 08/09/17 0825  WBC 16.8* 13.8* 13.2* 16.1* 18.2*  NEUTROABS  --  7.2 7.8*  --   --   HGB 9.9* 8.7* 8.1* 8.5* 8.8*  HCT 32.1* 29.3* 26.2* 28.1* 29.5*  MCV 88.7 88.8 88.8 89.2 89.1  PLT 297 243 246 260 465   Basic Metabolic Panel: Recent Labs  Lab 08/03/17 1153 08/04/17 1117 08/05/17 0530 08/09/17 0825  NA 140 142 141 141  K 4.1 4.3 4.2 4.7  CL 107 109 110 110  CO2 24 25 24 25   GLUCOSE 102* 110* 96 96  BUN 57* 52* 44* 43*  CREATININE 1.18 1.10 1.01 1.11  CALCIUM 9.9 9.8 9.3 9.7  MG  --   --  2.1  --    GFR: Estimated Creatinine Clearance: 66 mL/min (by C-G formula based on SCr of 1.11 mg/dL). Liver Function Tests: Recent Labs  Lab 08/03/17 1153  AST 23  ALT 16*  ALKPHOS 110  BILITOT 0.1*  PROT 6.1*  ALBUMIN 3.0*   Recent Labs  Lab 08/03/17 1153  LIPASE 27   No  results for input(s): AMMONIA in the last 168 hours. Coagulation Profile: No results for input(s): INR, PROTIME in the last 168 hours. Cardiac Enzymes: No results for input(s): CKTOTAL, CKMB, CKMBINDEX, TROPONINI in the last 168 hours. BNP (last 3 results) No results for input(s): PROBNP in the last 8760 hours. HbA1C: No results for input(s): HGBA1C in the last 72 hours. CBG: Recent Labs  Lab 08/08/17 1206 08/08/17 1737 08/08/17 2115 08/09/17 0825 08/09/17 1223  GLUCAP 146* 104* 131* 95 141*   Lipid Profile: No results for input(s): CHOL, HDL, LDLCALC, TRIG, CHOLHDL, LDLDIRECT in the last 72 hours. Thyroid Function Tests: No results for input(s): TSH, T4TOTAL, FREET4, T3FREE, THYROIDAB in the last 72 hours. Anemia Panel: No results for input(s): VITAMINB12, FOLATE, FERRITIN, TIBC, IRON, RETICCTPCT in the last 72 hours. Sepsis Labs: No results for input(s): PROCALCITON, LATICACIDVEN in the last 168 hours.  Recent Results (from the past 240 hour(s))  Urine culture     Status: Abnormal   Collection Time: 08/03/17  4:20 PM  Result Value Ref Range Status   Specimen Description URINE, CLEAN CATCH  Final   Special Requests   Final    NONE Performed at [facility], [place] [address] [place], [place] [address]    Culture >=100,000 COLONIES/mL PSEUDOMONAS AERUGINOSA (A)  Final   Report Status 08/05/2017 FINAL  Final   Organism ID, Bacteria PSEUDOMONAS AERUGINOSA (A)  Final      Susceptibility   Pseudomonas aeruginosa - MIC*    CEFTAZIDIME 4 SENSITIVE Sensitive     CIPROFLOXACIN >=4 RESISTANT Resistant     GENTAMICIN 4 SENSITIVE Sensitive     IMIPENEM 2 SENSITIVE Sensitive     PIP/TAZO 16 SENSITIVE Sensitive     CEFEPIME 8 SENSITIVE Sensitive     * >=100,000 COLONIES/mL PSEUDOMONAS AERUGINOSA  MRSA PCR Screening     Status: None   Collection Time: 08/03/17 10:43 PM  Result Value Ref Range Status   MRSA by PCR NEGATIVE NEGATIVE Final    Comment:        The GeneXpert  MRSA Assay (FDA approved for NASAL specimens only), is one component of a comprehensive MRSA colonization surveillance program. It is not intended to diagnose MRSA infection nor to guide or monitor treatment for MRSA infections. Performed at [facility], [place] [address], [place], [place] [address]   Culture, blood (routine x 2)     Status: None   Collection Time: 08/04/17 11:15 AM  Result Value Ref Range Status   Specimen Description BLOOD RIGHT ANTECUBITAL  Final   Special Requests   Final    BOTTLES DRAWN AEROBIC AND ANAEROBIC Blood Culture adequate volume   Culture   Final    NO GROWTH 5 DAYS Performed at [facility], [place] [address] [place], [place] [address]    Report Status 08/09/2017 FINAL  Final  Culture, blood (routine x 2)     Status: None   Collection Time: 08/04/17 11:30 AM  Result Value Ref Range Status   Specimen Description BLOOD RIGHT FOREARM  Final   Special Requests   Final    BOTTLES DRAWN AEROBIC AND ANAEROBIC Blood Culture adequate volume   Culture   Final    NO GROWTH 5 DAYS Performed at [facility], [place] [address], [place],  [address]    Report Status 08/09/2017 FINAL  Final      Radiology Studies: No results found. Scheduled Meds: . aspirin EC  81 mg Oral Daily  . enoxaparin (LOVENOX) injection  40 mg Subcutaneous Q24H  . ferrous gluconate  324 mg Oral Q breakfast  . fluticasone  1 spray Each Nare Daily  . furosemide  40 mg Oral BID  . hydrALAZINE  25 mg Oral BID  . insulin aspart  0-9 Units Subcutaneous TID WC  . tamsulosin  0.4 mg Oral QPC supper   Continuous Infusions: . ceFEPime (MAXIPIME) IV Stopped (08/09/17 1017)     LOS: 6 days    Velvet Bathe, MD [facility] Pager [phone]  If 7PM-7AM, please contact night-coverage [email or web address] Password [facility] 08/09/2017, 2:28 PM

## 2017-08-10 DIAGNOSIS — Z748 Other problems related to care provider dependency: Secondary | ICD-10-CM

## 2017-08-10 DIAGNOSIS — Z818 Family history of other mental and behavioral disorders: Secondary | ICD-10-CM

## 2017-08-10 DIAGNOSIS — N390 Urinary tract infection, site not specified: Secondary | ICD-10-CM

## 2017-08-10 DIAGNOSIS — Z87891 Personal history of nicotine dependence: Secondary | ICD-10-CM

## 2017-08-10 DIAGNOSIS — B965 Pseudomonas (aeruginosa) (mallei) (pseudomallei) as the cause of diseases classified elsewhere: Secondary | ICD-10-CM

## 2017-08-10 DIAGNOSIS — Z008 Encounter for other general examination: Secondary | ICD-10-CM

## 2017-08-10 DIAGNOSIS — Z742 Need for assistance at home and no other household member able to render care: Secondary | ICD-10-CM

## 2017-08-10 LAB — GLUCOSE, CAPILLARY
GLUCOSE-CAPILLARY: 93 mg/dL (ref 65–99)
Glucose-Capillary: 98 mg/dL (ref 65–99)
Glucose-Capillary: 102 mg/dL — ABNORMAL HIGH (ref 65–99)
Glucose-Capillary: 103 mg/dL — ABNORMAL HIGH (ref 65–99)

## 2017-08-10 NOTE — Progress Notes (Signed)
Patient ID: Juan [name], male   DOB: [birth year], 77 y.o.   MRN: [id number]  PROGRESS NOTE    Beth Spackman  UQJ:[id number] DOB: [birth year] DOA: 08/03/2017 PCP: Colon Branch, MD   Brief Narrative:  77 year old male with history of diabetes mellitus, myeloproliferative disorder, diastolic CHF, chronic hypoxic respiratory failure on home oxygen, chronic kidney disease stage III, pulmonary hypertension, CVA, OSA, BPH with urinary retention, gout and recent discharge from hospital on 07/22/2017 for UTI secondary to Pseudomonas infection which was treated with intravenous antibiotics/cefepime for 7-day course presented on 08/03/2017 with dysuria and bilateral flank pain.  CT abdomen and pelvis showed right hydroureteronephrosis and resolved left hydronephrosis.  ED provider consulted with ID/Dr.Snyder on phone and patient was started on IV cefepime.  Patient reports that God has directed him not to get a PICC line placed. He states that he has a special needs son at home who will not be able to help him get treatment. He is saying that he cant care for himself at home and as such would like to transition to SNF or stay in the hospital for full treatment of his current infection  Assessment & Plan:   Active Problems:   Myeloproliferative disease (HCC)   Obstructive sleep apnea   Chronic diastolic CHF (congestive heart failure) (HCC)   BPH (benign prostatic hyperplasia)   CKD (chronic kidney disease) stage 3, GFR 30-59 ml/min (HCC)   Hydronephrosis   Type II diabetes mellitus (HCC)   Pulmonary hypertension severe   Hypertrophy of prostate with urinary retention   UTI (urinary tract infection)   Probable complicated UTI in a patient with recent Pseudomonas UTI -Patient has right hydroureteronephrosis and resolved left hydronephrosis along with distended urinary bladder -Spoke to Dr. Borden/urology who recommended placement of Foley catheter.  Urology recommended continuing Foley catheter. Patient will require  IV antibiotics as such placed order for PICC line insertion. He needs to complete a 10-14 day treatment of antibiotics - consulted care manager to assist, discussed with pharmacy and nursing. - Patient has refused PICC line placement despite several attempts at discussing importance of such device by myself, nursing, and urologist.  - Discussed with case manager and social worker situation for best option given patients social condition and medical needs. Awaiting disposition recommendations given social situation and patient's limited ability to care for himself at home.   Leukocytosis -Probably secondary to above. Pt currently on appropriate antibiotic regimen. Will aim for longer duration of antibiotic therapy 14 days total currently at 8/14  DM2 - Most likely diet-controlled.  Not on oral meds at home - Accu-Cheks with insulin sliding scale coverage  Chronic diastolic CHF - Currently compensated.  Lasix on hold because patient received contrast for CT abdomen yesterday - If a.m. creatinine remained stable, home Lasix can be restarted  CKD stage 3 -Stable on admission.  Will repeat creatinine stat and in a.m.  BPH with history of urinary retention-  - Urology consulted please refer to last note. Plan is to leave foley in place and treat infection.  - Continue home tamsulosin  DVT prophylaxis: Lovenox Code Status: Full Family Communication: None at bedside Disposition Plan: Patient does not want to go home with prolonged IV antibiotic regimen. He would prefer to transition to skilled nursing facility or stay at hospital. Medically ready to transition out of hospital problem is social situation which limits effectiveness of him receiving needed antibiotic therapy  Consultants: Urology  Procedures: None  Antimicrobials: Cefepime from 08/03/2017 onwards  Subjective:  He reports no new complaints.   Objective: Vitals:   08/09/17 1303 08/09/17 2100 08/10/17 0500 08/10/17 0933    BP: (!) 144/60 (!) 155/75 136/63 (!) 145/53  Pulse: 62 62 (!) 56   Resp: 20 18 18    Temp: 97.8 F (36.6 C) 97.6 F (36.4 C) (!) 97.5 F (36.4 C)   TempSrc: Oral Oral Oral   SpO2: 98% 100% 97%   Weight:      Height:        Intake/Output Summary (Last 24 hours) at 08/10/2017 1438 Last data filed at 08/10/2017 1133 Gross per 24 hour  Intake 820 ml  Output 350 ml  Net 470 ml   Filed Weights   08/04/17 1946  Weight: 81.1 kg (178 lb 12.7 oz)    Examination: Exam unchanged when compared to 08/09/2017  General exam: Appears calm and comfortable.  No distress Respiratory system: Bilateral decreased breath sound at bases Cardiovascular system: S1 & S2 heard, rate controlled  gastrointestinal system: Abdomen is nondistended, soft. Normal bowel sounds heard.  Mild suprapubic tenderness and left CVA tenderness Central nervous system: Alert and oriented. No focal neurological deficits. Moving extremities Extremities: No cyanosis, clubbing; trace edema  Skin: Chronic stasis changes in bilateral lower extremities Psychiatry: Mood & affect appropriate.    Data Reviewed: I have personally reviewed following labs and imaging studies  CBC: Recent Labs  Lab 08/04/17 1117 08/05/17 0530 08/07/17 0038 08/09/17 0825  WBC 13.8* 13.2* 16.1* 18.2*  NEUTROABS 7.2 7.8*  --   --   HGB 8.7* 8.1* 8.5* 8.8*  HCT 29.3* 26.2* 28.1* 29.5*  MCV 88.8 88.8 89.2 89.1  PLT 243 246 260 357   Basic Metabolic Panel: Recent Labs  Lab 08/04/17 1117 08/05/17 0530 08/09/17 0825  NA 142 141 141  K 4.3 4.2 4.7  CL 109 110 110  CO2 25 24 25   GLUCOSE 110* 96 96  BUN 52* 44* 43*  CREATININE 1.10 1.01 1.11  CALCIUM 9.8 9.3 9.7  MG  --  2.1  --    GFR: Estimated Creatinine Clearance: 66 mL/min (by C-G formula based on SCr of 1.11 mg/dL). Liver Function Tests: No results for input(s): AST, ALT, ALKPHOS, BILITOT, PROT, ALBUMIN in the last 168 hours. No results for input(s): LIPASE, AMYLASE in the last  168 hours. No results for input(s): AMMONIA in the last 168 hours. Coagulation Profile: No results for input(s): INR, PROTIME in the last 168 hours. Cardiac Enzymes: No results for input(s): CKTOTAL, CKMB, CKMBINDEX, TROPONINI in the last 168 hours. BNP (last 3 results) No results for input(s): PROBNP in the last 8760 hours. HbA1C: No results for input(s): HGBA1C in the last 72 hours. CBG: Recent Labs  Lab 08/09/17 1223 08/09/17 1715 08/09/17 2203 08/10/17 0803 08/10/17 1214  GLUCAP 141* 95 99 93 98   Lipid Profile: No results for input(s): CHOL, HDL, LDLCALC, TRIG, CHOLHDL, LDLDIRECT in the last 72 hours. Thyroid Function Tests: No results for input(s): TSH, T4TOTAL, FREET4, T3FREE, THYROIDAB in the last 72 hours. Anemia Panel: No results for input(s): VITAMINB12, FOLATE, FERRITIN, TIBC, IRON, RETICCTPCT in the last 72 hours. Sepsis Labs: No results for input(s): PROCALCITON, LATICACIDVEN in the last 168 hours.  Recent Results (from the past 240 hour(s))  Urine culture     Status: Abnormal   Collection Time: 08/03/17  4:20 PM  Result Value Ref Range Status   Specimen Description URINE, CLEAN CATCH  Final   Special Requests   Final    NONE  Performed at [facility], [place] [address], [place], [place] [address]    Culture >=100,000 COLONIES/mL PSEUDOMONAS AERUGINOSA (A)  Final   Report Status 08/05/2017 FINAL  Final   Organism ID, Bacteria PSEUDOMONAS AERUGINOSA (A)  Final      Susceptibility   Pseudomonas aeruginosa - MIC*    CEFTAZIDIME 4 SENSITIVE Sensitive     CIPROFLOXACIN >=4 RESISTANT Resistant     GENTAMICIN 4 SENSITIVE Sensitive     IMIPENEM 2 SENSITIVE Sensitive     PIP/TAZO 16 SENSITIVE Sensitive     CEFEPIME 8 SENSITIVE Sensitive     * >=100,000 COLONIES/mL PSEUDOMONAS AERUGINOSA  MRSA PCR Screening     Status: None   Collection Time: 08/03/17 10:43 PM  Result Value Ref Range Status   MRSA by PCR NEGATIVE NEGATIVE Final    Comment:        The  GeneXpert MRSA Assay (FDA approved for NASAL specimens only), is one component of a comprehensive MRSA colonization surveillance program. It is not intended to diagnose MRSA infection nor to guide or monitor treatment for MRSA infections. Performed at [facility], [place] [address], [place], [place] [address]   Culture, blood (routine x 2)     Status: None   Collection Time: 08/04/17 11:15 AM  Result Value Ref Range Status   Specimen Description BLOOD RIGHT ANTECUBITAL  Final   Special Requests   Final    BOTTLES DRAWN AEROBIC AND ANAEROBIC Blood Culture adequate volume   Culture   Final    NO GROWTH 5 DAYS Performed at [facility], [place] [address] [place], [place] [address]    Report Status 08/09/2017 FINAL  Final  Culture, blood (routine x 2)     Status: None   Collection Time: 08/04/17 11:30 AM  Result Value Ref Range Status   Specimen Description BLOOD RIGHT FOREARM  Final   Special Requests   Final    BOTTLES DRAWN AEROBIC AND ANAEROBIC Blood Culture adequate volume   Culture   Final    NO GROWTH 5 DAYS Performed at [facility], [place] [address] [place], [place] [address]    Report Status 08/09/2017 FINAL  Final      Radiology Studies: No results found. Scheduled Meds: . aspirin EC  81 mg Oral Daily  . enoxaparin (LOVENOX) injection  40 mg Subcutaneous Q24H  . ferrous gluconate  324 mg Oral Q breakfast  . fluticasone  1 spray Each Nare Daily  . furosemide  40 mg Oral BID  . hydrALAZINE  25 mg Oral BID  . insulin aspart  0-9 Units Subcutaneous TID WC  . tamsulosin  0.4 mg Oral QPC supper   Continuous Infusions: . ceFEPime (MAXIPIME) IV Stopped (08/10/17 1003)     LOS: 7 days    Velvet Bathe, MD [facility] Pager [phone]  If 7PM-7AM, please contact night-coverage [email or web address] Password [facility] 08/10/2017, 2:38 PM

## 2017-08-10 NOTE — Consult Note (Signed)
   [facility] Inpatient Consult   08/10/2017  Juan [name] 1940/07/11 [id number]  [facility] Patient has continously declined services in the past.  Patient admitted with recurrent UTI.  HX of but not limited to chronic diastolic HF, severe pulmonary hypertension per MD notes. Patient is in the [place] [facility] [place] [facility] services under patient's Medicare plan.  Spoke with inpatient RNCM, Levada Dy regarding barriers to ongoing care.  Will continue to follow for needs and will approach regarding post hospital follow up.  Levada Dy states patient is getting treatment for UTI.  Will monitor for disposition and offer services as appropriate.     Please place a [facility] Care Management consult or for questions contact:   [name], RN BSN [facility] Liaison  [phone] business mobile phone Toll free office [phone]

## 2017-08-10 NOTE — Consult Note (Signed)
[facility] Consult   Reason for Consult: Capacity evaluation  Referring Physician:  Dr. Wendee Beavers Patient Identification: Juan [name] MRN:  [id number] Principal Diagnosis: Evaluation by psychiatric service required Diagnosis:   Patient Active Problem List   Diagnosis Date Noted  . Pyelonephritis [N12] 07/17/2017  . Recurrent UTI [N39.0] 07/16/2017  . Acute exacerbation of CHF (congestive heart failure) ([place]) [I50.9] 07/06/2017  . UTI (urinary tract infection) [N39.0] 07/06/2017  . Acute GI bleeding [K92.2] 06/07/2017  . Hypokalemia [E87.6]   . Iron deficiency anemia due to chronic blood loss [D50.0] 03/31/2017  . 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 Urinary retention [R33.9] 01/24/2017  . Hypertrophy of prostate with urinary retention [N40.1, R33.8] 12/24/2016  . Elevated troponin [R74.8] 11/14/2016  . Recurrent pneumonia [J18.9] 10/31/2016  . Pulmonary hypertension severe [I27.20]   . Sprain of left rotator cuff capsule [S43.422A]   . Hyperkalemia [E87.5]   . Paroxysmal SVT (supraventricular tachycardia) ([place]) [I47.1] 04/20/2016  . Type II diabetes mellitus ([place]) [E11.9] 04/20/2016  . Gout [M10.9] 04/20/2016  . B12 deficiency [E53.8] 04/20/2016  . 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 Hydronephrosis [N13.30]   . Thrombocytosis ([place]) [D47.3] 04/07/2016  . Pressure ulcer stage II [L89.92] 12/31/2015  . T wave inversion in EKG [R94.31] 12/29/2015  . PCP NOTES >>>>>>>>>>>>>>>>> [Z09] 05/02/2015  . Peripheral edema [R60.9]   . Thoracic aortic aneurysm ([place]) [I71.2] 04/26/2014  . CKD (chronic kidney disease) stage 3, GFR 30-59 ml/min (HCC) [N18.3] 04/26/2014  . Generalized weakness [R53.1] 04/06/2014  . Venous stasis dermatitis of both lower extremities [I87.2]   . Morbid obesity due to excess calories ([place]) [E66.01] 03/29/2014  . 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 Poor compliance with advise  [Z91.19] 05/05/2012   . Annual physical exam [W09.81] 01/14/2012  . Weight loss [R63.4] 01/14/2012  . BPH (benign prostatic hyperplasia) [N40.0] 01/14/2012  . Myeloproliferative disease ([place]) [D47.1] 09/07/2006  . Obstructive sleep apnea [G47.33] 09/07/2006  . Hypertensive heart disease with chronic diastolic congestive heart failure ([place]) [I11.0, I50.32] 09/07/2006  . Coronary atherosclerosis [I25.10] 09/07/2006  . Chronic diastolic CHF (congestive heart failure) ([place]) [I50.32] 09/07/2006  . Allergic rhinitis [J30.9] 09/07/2006    Total Time spent with patient: 1 hour  Subjective:   Juan [name] is a 77 y.o. male patient admitted with complicated UTI with recent Pseudomonas UTI.  HPI:   Per chart review, patient was recently treated for Pseudomonas UTI and discharged on 3/7. He presented to the hospital on 3/19 with dysuria and bilateral flank pain. He is receiving treatment for a complicated UTI. He requires a PICC line for long term antibiotics but he is refusing PICC line. He reports that his special needs son will not be able to help administer his antibiotics and that he would like to transition to a SNF since he is unable to care for himself or stay in the hospital for treatment. PT/OT has recommended SNF placement but patient is unable to afford it. He is unable to receive [facility] at this time due to lack of compliance in the past with home health services.   On interview, Juan [name] is asked why he is admitted to the hospital and he points at the area of his bladder and states "down there." He reports that he is not feeling better yet. He does not want to get a PICC line because he feels like it is too dangerous. He is unable to list the risks and benefits of  treatment with and without PICC line. He reports praying to the [facility] and he was told to not get a PICC line. He denies problems with depression, anxiety, sleep or appetite. He denies SI, HI or AVH.   Past Psychiatric History: Denies   Risk to  Self: Is patient at risk for suicide?: No Risk to Others:  None. Denies HI.  Prior Inpatient Therapy:  Denies  Prior Outpatient Therapy:  Denies   Past Medical History:  Past Medical History:  Diagnosis Date  . Allergic rhinitis   . Anemia   . Ascending aortic aneurysm ([place]) 03/2014   4.3cm on CT scan  . CAD (coronary artery disease)    dx elsewheer in past, no documentation. Non-ischemic myoview 2007  . Chronic diastolic CHF (congestive heart failure), NYHA class 2 (HCC)    Normal EF w/ grade 1 dd by echo 12/2015  . Edema    R>L leg, u/s 5-12 neg for DVT  . Hemorrhoid   . History of thrombocytosis   . Hypertension   . Iron deficiency anemia due to chronic blood loss 03/31/2017  . 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 Migraine    "once/wk at least" (07/11/2013)  . Myeloproliferative disease ([place])   . On home oxygen therapy    "2L; all the time" (05/03/2017)  . Pyelonephritis 04/29/2017  . Shortness of breath   . Sinus congestion   . Sleep apnea, obstructive    at some point used CPAP, was d/c  years ago  . Type II diabetes mellitus ([place])   . UTI (urinary tract infection) 06/2017  . UTI (urinary tract infection) 07/2017    Past Surgical History:  Procedure Laterality Date  . FLEXIBLE SIGMOIDOSCOPY N/A 06/08/2017   Procedure: FLEXIBLE SIGMOIDOSCOPY;  Surgeon: Carol Ada, MD;  Location: [facility];  Service: Endoscopy;  Laterality: N/A;  . IR FLUORO GUIDE CV LINE RIGHT  05/07/2017  . IR FLUORO RM 30-60 MIN  05/07/2017  . IR US GUIDE VASC ACCESS RIGHT  05/07/2017  . TOE SURGERY Right    "tried to straighten out big toe" (07/11/2013)   Family History:  Family History  Problem Relation Age of Onset  . Schizophrenia Son   . Mental illness Daughter   . Colon cancer Neg Hx   . Prostate cancer Neg Hx   . Heart attack Neg Hx   . Diabetes Neg Hx    Family Psychiatric  History: He has a special need's son with emotional problems.   Social History:  Social History    Substance and Sexual Activity  Alcohol Use No  . Alcohol/week: 0.0 oz     Social History   Substance and Sexual Activity  Drug Use No    Social History   Socioeconomic History  . Marital status: Widowed    Spouse name: Not on file  . Number of children: 2  . Years of education: Not on file  . Highest education level: Not on file  Occupational History  . Occupation: retired     Fish farm manager: RETIRED  Social Needs  . Financial resource strain: Not on file  . Food insecurity:    Worry: Not on file    Inability: Not on file  . Transportation needs:    Medical: Not on file    Non-medical: Not on file  Tobacco Use  . Smoking status: Former Smoker    Packs/day: 0.25    Years: 12.00    Pack years: 3.00    Types: Cigarettes  Last attempt to quit: 05/18/1966    Years since quitting: 51.2  . Smokeless tobacco: Never Used  . Tobacco comment: quit smoking 45 years ago  Substance and Sexual Activity  . Alcohol use: No    Alcohol/week: 0.0 oz  . Drug use: No  . Sexual activity: Yes  Lifestyle  . Physical activity:    Days per week: Not on file    Minutes per session: Not on file  . Stress: Not on file  Relationships  . Social connections:    Talks on phone: Not on file    Gets together: Not on file    Attends religious service: Not on file    Active member of club or organization: Not on file    Attends meetings of clubs or organizations: Not on file    Relationship status: Not on file  Other Topics Concern  . Not on file  Social History Narrative   Moved from [place] 2006   Widow 2007   Son lives with him       Admitted to [facility]08[facility]17   Former smoker-stopped 1968   Alcohol none   Full Code   Additional Social History: He lives at home with his 76 y/o special need's son. He was previously a Nature conservation officer. He denies illicit substance or alcohol use.     Allergies:  No Known Allergies  Labs:  Results for orders placed or performed  during the hospital encounter of 08/03/17 (from the past 48 hour(s))  Glucose, capillary     Status: Abnormal   Collection Time: 08/08/17  5:37 PM  Result Value Ref Range   Glucose-Capillary 104 (H) 65 - 99 mg/dL  Glucose, capillary     Status: Abnormal   Collection Time: 08/08/17  9:15 PM  Result Value Ref Range   Glucose-Capillary 131 (H) 65 - 99 mg/dL  CBC     Status: Abnormal   Collection Time: 08/09/17  8:25 AM  Result Value Ref Range   WBC 18.2 (H) 4.0 - 10.5 K/uL   RBC 3.31 (L) 4.22 - 5.81 MIL/uL   Hemoglobin 8.8 (L) 13.0 - 17.0 g/dL   HCT 29.5 (L) 39.0 - 52.0 %   MCV 89.1 78.0 - 100.0 fL   MCH 26.6 26.0 - 34.0 pg   MCHC 29.8 (L) 30.0 - 36.0 g/dL   RDW 17.1 (H) 11.5 - 15.5 %   Platelets 308 150 - 400 K/uL    Comment: Performed at [facility], [address] [place], [place] [address]  Basic metabolic panel     Status: Abnormal   Collection Time: 08/09/17  8:25 AM  Result Value Ref Range   Sodium 141 135 - 145 mmol/L   Potassium 4.7 3.5 - 5.1 mmol/L   Chloride 110 101 - 111 mmol/L   CO2 25 22 - 32 mmol/L   Glucose, Bld 96 65 - 99 mg/dL   BUN 43 (H) 6 - 20 mg/dL   Creatinine, Ser 1.11 0.61 - 1.24 mg/dL   Calcium 9.7 8.9 - 10.3 mg/dL   GFR calc non Af Amer >60 >60 mL/min   GFR calc Af Amer >60 >60 mL/min    Comment: (NOTE) The eGFR has been calculated using the CKD EPI equation. This calculation has not been validated in all clinical situations. eGFR's persistently <60 mL/min signify possible Chronic Kidney Disease.    Anion gap 6 5 - 15    Comment: Performed at [facility], [place] [address], [place], [place] [address]  Glucose, capillary     Status: None   Collection Time: 08/09/17  8:25 AM  Result Value Ref Range   Glucose-Capillary 95 65 - 99 mg/dL  Glucose, capillary     Status: Abnormal   Collection Time: 08/09/17 12:23 PM  Result Value Ref Range   Glucose-Capillary 141 (H) 65 - 99 mg/dL  Glucose, capillary     Status: None   Collection Time:  08/09/17  5:15 PM  Result Value Ref Range   Glucose-Capillary 95 65 - 99 mg/dL  Glucose, capillary     Status: None   Collection Time: 08/09/17 10:03 PM  Result Value Ref Range   Glucose-Capillary 99 65 - 99 mg/dL  Glucose, capillary     Status: None   Collection Time: 08/10/17  8:03 AM  Result Value Ref Range   Glucose-Capillary 93 65 - 99 mg/dL  Glucose, capillary     Status: None   Collection Time: 08/10/17 12:14 PM  Result Value Ref Range   Glucose-Capillary 98 65 - 99 mg/dL    Current Facility-Administered Medications  Medication Dose Route Frequency Provider Last Rate Last Dose  . acetaminophen (TYLENOL) tablet 650 mg  650 mg Oral Q6H PRN Emokpae, Ejiroghene E, MD   650 mg at 08/06/17 0528   Or  . acetaminophen (TYLENOL) suppository 650 mg  650 mg Rectal Q6H PRN Emokpae, Ejiroghene E, MD      . aspirin EC tablet 81 mg  81 mg Oral Daily Emokpae, Ejiroghene E, MD   81 mg at 08/10/17 0934  . ceFEPIme (MAXIPIME) 2 g in sodium chloride 0.9 % 100 mL IVPB  2 g Intravenous BID Bajbus, Lauren D, RPH   Stopped at 08/10/17 1003  . enoxaparin (LOVENOX) injection 40 mg  40 mg Subcutaneous Q24H Emokpae, Ejiroghene E, MD   40 mg at 08/10/17 0933  . ferrous gluconate (FERGON) tablet 324 mg  324 mg Oral Q breakfast Velvet Bathe, MD   324 mg at 08/10/17 0843  . fluticasone (FLONASE) 50 MCG/ACT nasal spray 1 spray  1 spray Each Nare Daily Velvet Bathe, MD   1 spray at 08/10/17 0954  . furosemide (LASIX) tablet 40 mg  40 mg Oral BID Velvet Bathe, MD   40 mg at 08/10/17 0843  . hydrALAZINE (APRESOLINE) tablet 25 mg  25 mg Oral BID Velvet Bathe, MD   25 mg at 08/10/17 0934  . insulin aspart (novoLOG) injection 0-9 Units  0-9 Units Subcutaneous TID WC Emokpae, Ejiroghene E, MD   1 Units at 08/09/17 1324  . ondansetron (ZOFRAN) tablet 4 mg  4 mg Oral Q6H PRN Emokpae, Ejiroghene E, MD       Or  . ondansetron (ZOFRAN) injection 4 mg  4 mg Intravenous Q6H PRN Emokpae, Ejiroghene E, MD      .  polyethylene glycol (MIRALAX / GLYCOLAX) packet 17 g  17 g Oral Daily PRN Emokpae, Ejiroghene E, MD      . tamsulosin (FLOMAX) capsule 0.4 mg  0.4 mg Oral QPC supper Emokpae, Ejiroghene E, MD   0.4 mg at 08/09/17 1730    Musculoskeletal: Strength & Muscle Tone: decreased Gait & Station: UTA due to patient lying in bed. Patient leans: N/A  Psychiatric Specialty Exam: Physical Exam  Nursing note and vitals reviewed. Constitutional: He appears well-developed and well-nourished.  HENT:  Head: Normocephalic and atraumatic.  Neck: Normal range of motion.  Respiratory: Effort normal.  Musculoskeletal: Normal  range of motion.  Neurological: He is alert.  Psychiatric: He has a normal mood and affect. His behavior is normal. Judgment and thought content normal. Cognition and memory are impaired.    Review of Systems  Constitutional: Negative for chills and fever.  Gastrointestinal: Negative for abdominal pain, constipation, diarrhea, nausea and vomiting.  Psychiatric/Behavioral: Negative for depression, hallucinations, substance abuse and suicidal ideas. The patient is not nervous/anxious and does not have insomnia.   All other systems reviewed and are negative.   Blood pressure (!) 145/53, pulse (!) 56, temperature (!) 97.5 F (36.4 C), temperature source Oral, resp. rate 18, height '6\' 3"'$  (1.905 m), weight 81.1 kg (178 lb 12.7 oz), SpO2 97 %.Body mass index is 22.35 kg/m.  General Appearance: Fairly Groomed, elderly, African American male, wearing a hospital gown with thinning hair and lying in bed. NAD.   Eye Contact:  Good  Speech:  Clear and Coherent and Normal Rate  Volume:  Normal  Mood:  "Okay"  Affect:  Appropriate  Thought Process:  Goal Directed, Linear and Descriptions of Associations: Intact  Orientation:  Full (Time, Place, and Person)  Thought Content:  Logical  Suicidal Thoughts:  No  Homicidal Thoughts:  No  Memory:  Immediate;   Fair Recent;   Fair Remote;   Fair   Judgement:  Poor  Insight:  Poor  Psychomotor Activity:  Normal  Concentration:  Concentration: Good and Attention Span: Good  Recall:  [employer] of Knowledge:  Fair  Language:  Fair  Akathisia:  No  Handed:  Right  AIMS (if indicated):   N/A  Assets:  Housing Social Support  ADL's:  Intact  Cognition:  Impaired. Patient does not appear to process information.  Sleep:   Okay   Assessment:  Juan [name] is a 77 y.o. male who was admitted with complicated UTI with recent Pseudomonas UTI. He refuses PICC line placement for long term antibiotics so psychiatry was consulted to determine if he has capacity to make this decision. Patient does not demonstrate capacity to refuse PICC line placement. He appears to lack an understanding of the seriousness of his condition and is unable to name the risks versus benefits of receiving a PICC line or alternative treatment. He is unable to rationally manipulate information which is likely secondary to cognitive deficits. He will need an authorized representative to help him make decisions regarding his current care.    Treatment Plan Summary: -Patient does not demonstrate capacity to refuse PICC line placement. Please see assessment for further details.  -Psychiatry will sign off on patient at this time. Please consult psychiatry again as needed.   Disposition: No evidence of imminent risk to self or others at present.   Patient does not meet criteria for psychiatric inpatient admission.  Faythe Dingwall, DO 08/10/2017 12:50 PM

## 2017-08-11 LAB — GLUCOSE, CAPILLARY
Glucose-Capillary: 90 mg/dL (ref 65–99)
Glucose-Capillary: 103 mg/dL — ABNORMAL HIGH (ref 65–99)
Glucose-Capillary: 120 mg/dL — ABNORMAL HIGH (ref 65–99)
Glucose-Capillary: 103 mg/dL — ABNORMAL HIGH (ref 65–99)

## 2017-08-11 NOTE — Progress Notes (Signed)
Patient ID: Juan [name], male   DOB: August 21, 1940, 77 y.o.   MRN: [id number]  PROGRESS NOTE    Juan [name]  IOX:[id number] DOB: [birth year] DOA: 08/03/2017 PCP: Colon Branch, MD   Brief Narrative:  77 year old male with history of diabetes mellitus, myeloproliferative disorder, diastolic CHF, chronic hypoxic respiratory failure on home oxygen, chronic kidney disease stage III, pulmonary hypertension, CVA, OSA, BPH with urinary retention, gout and recent discharge from hospital on 07/22/2017 for UTI secondary to Pseudomonas infection which was treated with intravenous antibiotics/cefepime for 7-day course presented on 08/03/2017 with dysuria and bilateral flank pain.  CT abdomen and pelvis showed right hydroureteronephrosis and resolved left hydronephrosis.  ED provider consulted with ID/Dr.Snyder on phone and patient was started on IV cefepime.  Patient reports that God has directed him not to get a PICC line placed. He states that he has a special needs son at home who will not be able to help him get treatment.   Psychiatry has deemed patient not to have capacity to make his own medical decision.   Assessment & Plan:   Principal Problem:   Evaluation by psychiatric service required Active Problems:   Myeloproliferative disease ([place])   Obstructive sleep apnea   Chronic diastolic CHF (congestive heart failure) (HCC)   BPH (benign prostatic hyperplasia)   CKD (chronic kidney disease) stage 3, GFR 30-59 ml/min (HCC)   Hydronephrosis   Type II diabetes mellitus (HCC)   Pulmonary hypertension severe   Hypertrophy of prostate with urinary retention   UTI (urinary tract infection)   Probable complicated UTI in a patient with recent Pseudomonas UTI -Plan will be to treat in house until patient completes 12-14 day treatment with antibiotics. This is because it is not safe for him to do it at his house and he refuses picc line ( and even though he does not have capacity to refuse picc line he poses risk of  removing it at SNF or home)  Leukocytosis -Probably secondary to above. Pt currently on appropriate antibiotic regimen. Will aim for longer duration of antibiotic therapy 14 days total currently at 9/14  DM2 - Most likely diet-controlled.  Not on oral meds at home - Accu-Cheks with insulin sliding scale coverage  Chronic diastolic CHF - Currently compensated.  Lasix on hold because patient received contrast for CT abdomen yesterday - If a.m. creatinine remained stable, home Lasix can be restarted  CKD stage 3 -Stable currently.  BPH with history of urinary retention-  - Urology consulted please refer to last note. Plan is to leave foley in place and treat infection.  - Continue home tamsulosin  DVT prophylaxis: Lovenox Code Status: Full Family Communication: None at bedside Disposition Plan: inpatient treatment of infectious etiology for full duration 14 total days.  Consultants: Urology  Procedures: None  Antimicrobials: Cefepime from 08/03/2017 onwards  Subjective: No new complaints currently.   Objective: Vitals:   08/10/17 1512 08/10/17 2136 08/11/17 0449 08/11/17 1404  BP: (!) 147/58 (!) 151/63 (!) 144/74 (!) 151/55  Pulse: 68 (!) 58 (!) 54 77  Resp: 18 18 17 18   Temp: 97.9 F (36.6 C) 98.4 F (36.9 C) 97.8 F (36.6 C) 98.5 F (36.9 C)  TempSrc: Oral Oral Oral Oral  SpO2: 100% 99% 98% 98%  Weight:      Height:        Intake/Output Summary (Last 24 hours) at 08/11/2017 1604 Last data filed at 08/11/2017 1405 Gross per 24 hour  Intake 342 ml  Output 2400  ml  Net -2058 ml   Filed Weights   08/04/17 1946  Weight: 81.1 kg (178 lb 12.7 oz)    Examination: Exam unchanged when compared to 08/10/2017  General exam: Appears calm and comfortable.  No distress Respiratory system: Bilateral decreased breath sound at bases Cardiovascular system: S1 & S2 heard, rate controlled  gastrointestinal system: Abdomen is nondistended, soft. Normal bowel sounds heard.   Mild suprapubic tenderness and left CVA tenderness Central nervous system: Alert and oriented. No focal neurological deficits. Moving extremities Extremities: No cyanosis, clubbing; trace edema  Skin: Chronic stasis changes in bilateral lower extremities Psychiatry: Mood & affect appropriate.    Data Reviewed: I have personally reviewed following labs and imaging studies  CBC: Recent Labs  Lab 08/05/17 0530 08/07/17 0038 08/09/17 0825  WBC 13.2* 16.1* 18.2*  NEUTROABS 7.8*  --   --   HGB 8.1* 8.5* 8.8*  HCT 26.2* 28.1* 29.5*  MCV 88.8 89.2 89.1  PLT 246 260 751   Basic Metabolic Panel: Recent Labs  Lab 08/05/17 0530 08/09/17 0825  NA 141 141  K 4.2 4.7  CL 110 110  CO2 24 25  GLUCOSE 96 96  BUN 44* 43*  CREATININE 1.01 1.11  CALCIUM 9.3 9.7  MG 2.1  --    GFR: Estimated Creatinine Clearance: 66 mL/min (by C-G formula based on SCr of 1.11 mg/dL). Liver Function Tests: No results for input(s): AST, ALT, ALKPHOS, BILITOT, PROT, ALBUMIN in the last 168 hours. No results for input(s): LIPASE, AMYLASE in the last 168 hours. No results for input(s): AMMONIA in the last 168 hours. Coagulation Profile: No results for input(s): INR, PROTIME in the last 168 hours. Cardiac Enzymes: No results for input(s): CKTOTAL, CKMB, CKMBINDEX, TROPONINI in the last 168 hours. BNP (last 3 results) No results for input(s): PROBNP in the last 8760 hours. HbA1C: No results for input(s): HGBA1C in the last 72 hours. CBG: Recent Labs  Lab 08/10/17 1214 08/10/17 1609 08/10/17 2137 08/11/17 0756 08/11/17 1218  GLUCAP 98 102* 103* 90 103*   Lipid Profile: No results for input(s): CHOL, HDL, LDLCALC, TRIG, CHOLHDL, LDLDIRECT in the last 72 hours. Thyroid Function Tests: No results for input(s): TSH, T4TOTAL, FREET4, T3FREE, THYROIDAB in the last 72 hours. Anemia Panel: No results for input(s): VITAMINB12, FOLATE, FERRITIN, TIBC, IRON, RETICCTPCT in the last 72 hours. Sepsis  Labs: No results for input(s): PROCALCITON, LATICACIDVEN in the last 168 hours.  Recent Results (from the past 240 hour(s))  Urine culture     Status: Abnormal   Collection Time: 08/03/17  4:20 PM  Result Value Ref Range Status   Specimen Description URINE, CLEAN CATCH  Final   Special Requests   Final    NONE Performed at [facility], [address] [place], [place] [address]    Culture >=100,000 COLONIES/mL PSEUDOMONAS AERUGINOSA (A)  Final   Report Status 08/05/2017 FINAL  Final   Organism ID, Bacteria PSEUDOMONAS AERUGINOSA (A)  Final      Susceptibility   Pseudomonas aeruginosa - MIC*    CEFTAZIDIME 4 SENSITIVE Sensitive     CIPROFLOXACIN >=4 RESISTANT Resistant     GENTAMICIN 4 SENSITIVE Sensitive     IMIPENEM 2 SENSITIVE Sensitive     PIP/TAZO 16 SENSITIVE Sensitive     CEFEPIME 8 SENSITIVE Sensitive     * >=100,000 COLONIES/mL PSEUDOMONAS AERUGINOSA  MRSA PCR Screening     Status: None   Collection Time: 08/03/17 10:43 PM  Result Value Ref  Range Status   MRSA by PCR NEGATIVE NEGATIVE Final    Comment:        The GeneXpert MRSA Assay (FDA approved for NASAL specimens only), is one component of a comprehensive MRSA colonization surveillance program. It is not intended to diagnose MRSA infection nor to guide or monitor treatment for MRSA infections. Performed at [facility], [place] [address] [place], [place] [address]   Culture, blood (routine x 2)     Status: None   Collection Time: 08/04/17 11:15 AM  Result Value Ref Range Status   Specimen Description BLOOD RIGHT ANTECUBITAL  Final   Special Requests   Final    BOTTLES DRAWN AEROBIC AND ANAEROBIC Blood Culture adequate volume   Culture   Final    NO GROWTH 5 DAYS Performed at [facility], [place] [address], [place], [place] [address]    Report Status 08/09/2017 FINAL  Final  Culture, blood (routine x 2)     Status: None   Collection Time: 08/04/17 11:30 AM  Result Value Ref Range Status    Specimen Description BLOOD RIGHT FOREARM  Final   Special Requests   Final    BOTTLES DRAWN AEROBIC AND ANAEROBIC Blood Culture adequate volume   Culture   Final    NO GROWTH 5 DAYS Performed at [facility], [place] [address] [place], [place] [address]    Report Status 08/09/2017 FINAL  Final      Radiology Studies: No results found. Scheduled Meds: . aspirin EC  81 mg Oral Daily  . enoxaparin (LOVENOX) injection  40 mg Subcutaneous Q24H  . ferrous gluconate  324 mg Oral Q breakfast  . fluticasone  1 spray Each Nare Daily  . furosemide  40 mg Oral BID  . hydrALAZINE  25 mg Oral BID  . insulin aspart  0-9 Units Subcutaneous TID WC  . tamsulosin  0.4 mg Oral QPC supper   Continuous Infusions: . ceFEPime (MAXIPIME) IV Stopped (08/11/17 1022)     LOS: 8 days    Velvet Bathe, MD [facility] Pager [phone]  If 7PM-7AM, please contact night-coverage [email or web address] Password [facility] 08/11/2017, 4:04 PM

## 2017-08-11 NOTE — Progress Notes (Signed)
Physical Therapy Treatment Patient Details Name: Juan [name] MRN: [id number] DOB: August 03, 1940 Today's Date: 08/11/2017    History of Present Illness Pt adm with recurrent UTI. 77 year old male with PMH of CAD, chronic diastolic CHF, HTN, DM, OSA not on CPAP, chronic hypoxic respiratory failure on 2 L/min oxygen, severe pulmonary hypertension, AAA, MDS, iron deficiency anemia, recent hospitalization 2/19-2/23 for decompensated CHF. Pt also with recent adm for recurrent UTI.    PT Comments    Pt able to progress his ambulation distance by 10 feet with maximal cuing for need to push himself to be able to get stronger. Pt continues to be mod I for bed mobility, supervision for transfers and supervision for ambulation of 100 feet with RW. From a mobility standpoint pt d/c plans remain appropriate. PT will continue to follow acutely until d/c.      Follow Up Recommendations  No PT follow up     Equipment Recommendations  None recommended by PT    Recommendations for Other Services       Precautions / Restrictions Precautions Precautions: Fall Restrictions Weight Bearing Restrictions: No    Mobility  Bed Mobility Overal bed mobility: Modified Independent Bed Mobility: Supine to Sit;Sit to Supine     Supine to sit: Modified independent (Device/Increase time);HOB elevated Sit to supine: Modified independent (Device/Increase time);HOB elevated   General bed mobility comments: Incr time  Transfers Overall transfer level: Needs assistance Equipment used: Rolling walker (2 wheeled) Transfers: Sit to/from Stand Sit to Stand: Supervision;From elevated surface         General transfer comment: Pt utilizes a lift chair at home and as such requires very elevated surface for sit<>stand   Ambulation/Gait Ambulation/Gait assistance: Supervision Ambulation Distance (Feet): 100 Feet Assistive device: Rolling walker (2 wheeled) Gait Pattern/deviations: Step-through pattern;Decreased  stride length;Trunk flexed Gait velocity: decr Gait velocity interpretation: Below normal speed for age/gender General Gait Details: Supervision for safety, vc for upright posture and proximity to RW       Balance Overall balance assessment: Needs assistance Sitting-balance support: No upper extremity supported;Feet supported Sitting balance-Leahy Scale: Good     Standing balance support: Bilateral upper extremity supported Standing balance-Leahy Scale: Poor Standing balance comment: walker for support                            Cognition Arousal/Alertness: Awake/alert Behavior During Therapy: WFL for tasks assessed/performed Overall Cognitive Status: Within Functional Limits for tasks assessed                                               Pertinent Vitals/Pain Pain Assessment: No/denies pain    Home Living Family/patient expects to be discharged to:: Private residence Living Arrangements: Children Available Help at Discharge: Family;Available 24 hours/day Type of Home: Apartment Home Access: Level entry   Home Layout: One level Home Equipment: Walker - 2 wheels;Cane - single point;Shower seat;Bedside commode;Wheelchair - manual;Hospital bed Additional Comments: pt lives with his son    Prior Function Level of Independence: Needs assistance  Gait / Transfers Assistance Needed: Limited to short amb distances with RW or SPC. Intermittent use of w/c ADL's / Homemaking Assistance Needed: Takes bird baths at sink. Son assists with ADLs as needed. Family drives him     PT Goals (current goals can now be found in the care  plan section) Acute Rehab PT Goals Patient Stated Goal: not stated PT Goal Formulation: With patient Time For Goal Achievement: 08/12/17 Potential to Achieve Goals: Good Progress towards PT goals: Progressing toward goals    Frequency    Min 3X/week      PT Plan Current plan remains appropriate       AM-PAC PT  "6 Clicks" Daily Activity  Outcome Measure  Difficulty turning over in bed (including adjusting bedclothes, sheets and blankets)?: A Little Difficulty moving from lying on back to sitting on the side of the bed? : A Little Difficulty sitting down on and standing up from a chair with arms (e.g., wheelchair, bedside commode, etc,.)?: A Little Help needed moving to and from a bed to chair (including a wheelchair)?: A Little Help needed walking in hospital room?: A Little Help needed climbing 3-5 steps with a railing? : A Little 6 Click Score: 18    End of Session Equipment Utilized During Treatment: Gait belt Activity Tolerance: Patient tolerated treatment well Patient left: in bed;with call Leap/phone within reach Nurse Communication: Mobility status PT Visit Diagnosis: Other abnormalities of gait and mobility (R26.89)     Time: 1359-1420 PT Time Calculation (min) (ACUTE ONLY): 21 min  Charges:  $Gait Training: 8-22 mins                    G Codes:       Tenishia Ekman B. Migdalia Dk PT, DPT Acute Rehabilitation  [phone] Pager [phone]     [place] 08/11/2017, 4:34 PM

## 2017-08-12 LAB — GLUCOSE, CAPILLARY
GLUCOSE-CAPILLARY: 88 mg/dL (ref 65–99)
GLUCOSE-CAPILLARY: 101 mg/dL — AB (ref 65–99)
Glucose-Capillary: 113 mg/dL — ABNORMAL HIGH (ref 65–99)
Glucose-Capillary: 110 mg/dL — ABNORMAL HIGH (ref 65–99)

## 2017-08-12 NOTE — Progress Notes (Signed)
Patient ID: Juan [name], male   DOB: 04-Nov-1940, 77 y.o.   MRN: [id number]  PROGRESS NOTE    Juan [name]  PJA:[id number] DOB: 08/11/1940 DOA: 08/03/2017 PCP: Colon Branch, MD   Brief Narrative:  77 year old male with history of diabetes mellitus, myeloproliferative disorder, diastolic CHF, chronic hypoxic respiratory failure on home oxygen, chronic kidney disease stage III, pulmonary hypertension, CVA, OSA, BPH with urinary retention, gout and recent discharge from hospital on 07/22/2017 for UTI secondary to Pseudomonas infection which was treated with intravenous antibiotics/cefepime for 7-day course presented on 08/03/2017 with dysuria and bilateral flank pain.  CT abdomen and pelvis showed right hydroureteronephrosis and resolved left hydronephrosis.  ED provider consulted with ID/Dr.Snyder on phone and patient was started on IV cefepime.  Patient reports that God has directed him not to get a PICC line placed. He states that he has a special needs son at home who will not be able to help him get treatment.   Psychiatry has deemed patient not to have capacity to make his own medical decision.   Assessment & Plan:   Principal Problem:   Evaluation by psychiatric service required Active Problems:   Myeloproliferative disease ([place])   Obstructive sleep apnea   Chronic diastolic CHF (congestive heart failure) (HCC)   BPH (benign prostatic hyperplasia)   CKD (chronic kidney disease) stage 3, GFR 30-59 ml/min (HCC)   Hydronephrosis   Type II diabetes mellitus ([place])   Pulmonary hypertension severe   Hypertrophy of prostate with urinary retention   UTI (urinary tract infection)   Probable complicated UTI in a patient with recent Pseudomonas UTI - Continue current medication regimen. Improving daily.  Leukocytosis -Probably secondary to above. Pt currently on appropriate antibiotic regimen. Will aim for longer duration of antibiotic therapy 14 days total currently at 10/14  DM2 - Most likely  diet-controlled.  Not on oral meds at home - Accu-Cheks with insulin sliding scale coverage  Chronic diastolic CHF - Currently compensated.  Lasix on hold because patient received contrast for CT abdomen yesterday - If a.m. creatinine remained stable, home Lasix can be restarted  CKD stage 3 -Stable currently.  BPH with history of urinary retention-  - Urology consulted please refer to last note. Plan is to leave foley in place and treat infection.  - Continue home tamsulosin  DVT prophylaxis: Lovenox Code Status: Full Family Communication: None at bedside Disposition Plan: inpatient treatment of infectious etiology for full duration 14 total days.  Consultants: Urology  Procedures: None  Antimicrobials: Cefepime from 08/03/2017 onwards  Subjective: No new problems reported.  Objective: Vitals:   08/11/17 1900 08/11/17 2056 08/12/17 0625 08/12/17 1328  BP: (!) 153/66 (!) 153/66 (!) 159/66 (!) 143/61  Pulse: (!) 55 (!) 55 74 (!) 53  Resp: 18 18 18 20   Temp: 98.3 F (36.8 C) 98.3 F (36.8 C) 98.6 F (37 C) 97.9 F (36.6 C)  TempSrc: Oral Oral Oral Oral  SpO2: 99% 99% 99% 100%  Weight:      Height:        Intake/Output Summary (Last 24 hours) at 08/12/2017 1557 Last data filed at 08/12/2017 1505 Gross per 24 hour  Intake 200 ml  Output 1750 ml  Net -1550 ml   Filed Weights   08/04/17 1946  Weight: 81.1 kg (178 lb 12.7 oz)    Examination: Exam unchanged when compared to 08/11/2017  General exam: Appears calm and comfortable.  No distress Respiratory system: Bilateral decreased breath sound at bases Cardiovascular system:  S1 & S2 heard, rate controlled  gastrointestinal system: Abdomen is nondistended, soft. Normal bowel sounds heard.  Mild suprapubic tenderness and left CVA tenderness Central nervous system: Alert and oriented. No focal neurological deficits. Moving extremities Extremities: No cyanosis, clubbing; trace edema  Skin: Chronic stasis changes in  bilateral lower extremities Psychiatry: Mood & affect appropriate.    Data Reviewed: I have personally reviewed following labs and imaging studies  CBC: Recent Labs  Lab 08/07/17 0038 08/09/17 0825  WBC 16.1* 18.2*  HGB 8.5* 8.8*  HCT 28.1* 29.5*  MCV 89.2 89.1  PLT 260 062   Basic Metabolic Panel: Recent Labs  Lab 08/09/17 0825  NA 141  K 4.7  CL 110  CO2 25  GLUCOSE 96  BUN 43*  CREATININE 1.11  CALCIUM 9.7   GFR: Estimated Creatinine Clearance: 66 mL/min (by C-G formula based on SCr of 1.11 mg/dL). Liver Function Tests: No results for input(s): AST, ALT, ALKPHOS, BILITOT, PROT, ALBUMIN in the last 168 hours. No results for input(s): LIPASE, AMYLASE in the last 168 hours. No results for input(s): AMMONIA in the last 168 hours. Coagulation Profile: No results for input(s): INR, PROTIME in the last 168 hours. Cardiac Enzymes: No results for input(s): CKTOTAL, CKMB, CKMBINDEX, TROPONINI in the last 168 hours. BNP (last 3 results) No results for input(s): PROBNP in the last 8760 hours. HbA1C: No results for input(s): HGBA1C in the last 72 hours. CBG: Recent Labs  Lab 08/11/17 1218 08/11/17 1628 08/11/17 2059 08/12/17 0804 08/12/17 1205  GLUCAP 103* 120* 103* 88 101*   Lipid Profile: No results for input(s): CHOL, HDL, LDLCALC, TRIG, CHOLHDL, LDLDIRECT in the last 72 hours. Thyroid Function Tests: No results for input(s): TSH, T4TOTAL, FREET4, T3FREE, THYROIDAB in the last 72 hours. Anemia Panel: No results for input(s): VITAMINB12, FOLATE, FERRITIN, TIBC, IRON, RETICCTPCT in the last 72 hours. Sepsis Labs: No results for input(s): PROCALCITON, LATICACIDVEN in the last 168 hours.  Recent Results (from the past 240 hour(s))  Urine culture     Status: Abnormal   Collection Time: 08/03/17  4:20 PM  Result Value Ref Range Status   Specimen Description URINE, CLEAN CATCH  Final   Special Requests   Final    NONE Performed at [facility], [address], [place], [place] [address]    Culture >=100,000 COLONIES/mL PSEUDOMONAS AERUGINOSA (A)  Final   Report Status 08/05/2017 FINAL  Final   Organism ID, Bacteria PSEUDOMONAS AERUGINOSA (A)  Final      Susceptibility   Pseudomonas aeruginosa - MIC*    CEFTAZIDIME 4 SENSITIVE Sensitive     CIPROFLOXACIN >=4 RESISTANT Resistant     GENTAMICIN 4 SENSITIVE Sensitive     IMIPENEM 2 SENSITIVE Sensitive     PIP/TAZO 16 SENSITIVE Sensitive     CEFEPIME 8 SENSITIVE Sensitive     * >=100,000 COLONIES/mL PSEUDOMONAS AERUGINOSA  MRSA PCR Screening     Status: None   Collection Time: 08/03/17 10:43 PM  Result Value Ref Range Status   MRSA by PCR NEGATIVE NEGATIVE Final    Comment:        The GeneXpert MRSA Assay (FDA approved for NASAL specimens only), is one component of a comprehensive MRSA colonization surveillance program. It is not intended to diagnose MRSA infection nor to guide or monitor treatment for MRSA infections. Performed at [facility], [place] [address], [place], [place] [address]   Culture, blood (routine x 2)     Status:  None   Collection Time: 08/04/17 11:15 AM  Result Value Ref Range Status   Specimen Description BLOOD RIGHT ANTECUBITAL  Final   Special Requests   Final    BOTTLES DRAWN AEROBIC AND ANAEROBIC Blood Culture adequate volume   Culture   Final    NO GROWTH 5 DAYS Performed at [facility], [address], [place], [place] [address]    Report Status 08/09/2017 FINAL  Final  Culture, blood (routine x 2)     Status: None   Collection Time: 08/04/17 11:30 AM  Result Value Ref Range Status   Specimen Description BLOOD RIGHT FOREARM  Final   Special Requests   Final    BOTTLES DRAWN AEROBIC AND ANAEROBIC Blood Culture adequate volume   Culture   Final    NO GROWTH 5 DAYS Performed at [facility], [place] [address], [place], [place] [address]    Report Status 08/09/2017 FINAL  Final      Radiology Studies: No results found. Scheduled  Meds: . aspirin EC  81 mg Oral Daily  . enoxaparin (LOVENOX) injection  40 mg Subcutaneous Q24H  . ferrous gluconate  324 mg Oral Q breakfast  . fluticasone  1 spray Each Nare Daily  . furosemide  40 mg Oral BID  . hydrALAZINE  25 mg Oral BID  . insulin aspart  0-9 Units Subcutaneous TID WC  . tamsulosin  0.4 mg Oral QPC supper   Continuous Infusions: . ceFEPime (MAXIPIME) IV Stopped (08/12/17 0959)     LOS: 9 days    Velvet Bathe, MD [facility] Pager [phone]  If 7PM-7AM, please contact night-coverage [email or web address] Password [facility] 08/12/2017, 3:57 PM

## 2017-08-12 NOTE — Plan of Care (Signed)

## 2017-08-13 LAB — GLUCOSE, CAPILLARY
GLUCOSE-CAPILLARY: 88 mg/dL (ref 65–99)
GLUCOSE-CAPILLARY: 107 mg/dL — AB (ref 65–99)
GLUCOSE-CAPILLARY: 85 mg/dL (ref 65–99)

## 2017-08-13 NOTE — Progress Notes (Signed)
Patient ID: Juan [name], male   DOB: 1940-10-29, 77 y.o.   MRN: [id number]  PROGRESS NOTE    Juan [name]  QPR:[id number] DOB: June 11, 1940 DOA: 08/03/2017 PCP: Colon Branch, MD   Brief Narrative:  77 year old male with history of diabetes mellitus, myeloproliferative disorder, diastolic CHF, chronic hypoxic respiratory failure on home oxygen, chronic kidney disease stage III, pulmonary hypertension, CVA, OSA, BPH with urinary retention, gout and recent discharge from hospital on 07/22/2017 for UTI secondary to Pseudomonas infection which was treated with intravenous antibiotics/cefepime for 7-day course presented on 08/03/2017 with dysuria and bilateral flank pain.  CT abdomen and pelvis showed right hydroureteronephrosis and resolved left hydronephrosis.  ED provider consulted with ID/Dr.Snyder on phone and patient was started on IV cefepime.  Patient reports that God has directed him not to get a PICC line placed. He states that he has a special needs son at home who will not be able to help him get treatment.   Psychiatry has deemed patient not to have capacity to make his own medical decision.   Assessment & Plan:   Principal Problem:   Evaluation by psychiatric service required Active Problems:   Myeloproliferative disease ([place])   Obstructive sleep apnea   Chronic diastolic CHF (congestive heart failure) (HCC)   BPH (benign prostatic hyperplasia)   CKD (chronic kidney disease) stage 3, GFR 30-59 ml/min (HCC)   Hydronephrosis   Type II diabetes mellitus ([place])   Pulmonary hypertension severe   Hypertrophy of prostate with urinary retention   UTI (urinary tract infection)   Probable complicated UTI in a patient with recent Pseudomonas UTI - Continue current medication regimen. Improving daily.  Leukocytosis -Probably secondary to above. Pt currently on appropriate antibiotic regimen. Will aim for longer duration of antibiotic therapy 14 days total currently at 11/14 - continue plan as  indicated above.   DM2 - Most likely diet-controlled.  Not on oral meds at home - Accu-Cheks with insulin sliding scale coverage  Chronic diastolic CHF - Currently compensated.  Lasix on hold because patient received contrast for CT abdomen yesterday - If a.m. creatinine remained stable, home Lasix can be restarted  CKD stage 3 -Stable currently.  BPH with history of urinary retention-  - Urology consulted please refer to last note. Plan is to leave foley in place and treat infection.  - Continue home tamsulosin  DVT prophylaxis: Lovenox Code Status: Full Family Communication: None at bedside Disposition Plan: inpatient treatment of infectious etiology for full duration 14 total days.  Consultants: Urology  Procedures: None  Antimicrobials: Cefepime from 08/03/2017 onwards  Subjective: Pt has no new complaints currently.   Objective: Vitals:   08/12/17 1328 08/12/17 2214 08/13/17 0628 08/13/17 1322  BP: (!) 143/61 (!) 157/72 (!) 155/60 (!) 135/116  Pulse: (!) 53 (!) 56 (!) 53 (!) 57  Resp: 20 18 18 18   Temp: 97.9 F (36.6 C)  97.8 F (36.6 C) 97.9 F (36.6 C)  TempSrc: Oral  Oral Oral  SpO2: 100% 97% 99% 100%  Weight:      Height:        Intake/Output Summary (Last 24 hours) at 08/13/2017 1757 Last data filed at 08/13/2017 1447 Gross per 24 hour  Intake 380 ml  Output 1400 ml  Net -1020 ml   Filed Weights   08/04/17 1946  Weight: 81.1 kg (178 lb 12.7 oz)    Examination: Exam unchanged when compared to 08/12/2017  General exam: Appears calm and comfortable.  No distress Respiratory system:  Bilateral decreased breath sound at bases Cardiovascular system: S1 & S2 heard, rate controlled  gastrointestinal system: Abdomen is nondistended, soft. Normal bowel sounds heard.  Mild suprapubic tenderness and left CVA tenderness Central nervous system: Alert and oriented. No focal neurological deficits. Moving extremities Extremities: No cyanosis, clubbing; trace  edema  Skin: Chronic stasis changes in bilateral lower extremities Psychiatry: Mood & affect appropriate.    Data Reviewed: I have personally reviewed following labs and imaging studies  CBC: Recent Labs  Lab 08/07/17 0038 08/09/17 0825  WBC 16.1* 18.2*  HGB 8.5* 8.8*  HCT 28.1* 29.5*  MCV 89.2 89.1  PLT 260 818   Basic Metabolic Panel: Recent Labs  Lab 08/09/17 0825  NA 141  K 4.7  CL 110  CO2 25  GLUCOSE 96  BUN 43*  CREATININE 1.11  CALCIUM 9.7   GFR: Estimated Creatinine Clearance: 66 mL/min (by C-G formula based on SCr of 1.11 mg/dL). Liver Function Tests: No results for input(s): AST, ALT, ALKPHOS, BILITOT, PROT, ALBUMIN in the last 168 hours. No results for input(s): LIPASE, AMYLASE in the last 168 hours. No results for input(s): AMMONIA in the last 168 hours. Coagulation Profile: No results for input(s): INR, PROTIME in the last 168 hours. Cardiac Enzymes: No results for input(s): CKTOTAL, CKMB, CKMBINDEX, TROPONINI in the last 168 hours. BNP (last 3 results) No results for input(s): PROBNP in the last 8760 hours. HbA1C: No results for input(s): HGBA1C in the last 72 hours. CBG: Recent Labs  Lab 08/12/17 1205 08/12/17 1715 08/12/17 2214 08/13/17 0750 08/13/17 1156  GLUCAP 101* 113* 110* 88 107*   Lipid Profile: No results for input(s): CHOL, HDL, LDLCALC, TRIG, CHOLHDL, LDLDIRECT in the last 72 hours. Thyroid Function Tests: No results for input(s): TSH, T4TOTAL, FREET4, T3FREE, THYROIDAB in the last 72 hours. Anemia Panel: No results for input(s): VITAMINB12, FOLATE, FERRITIN, TIBC, IRON, RETICCTPCT in the last 72 hours. Sepsis Labs: No results for input(s): PROCALCITON, LATICACIDVEN in the last 168 hours.  Recent Results (from the past 240 hour(s))  MRSA PCR Screening     Status: None   Collection Time: 08/03/17 10:43 PM  Result Value Ref Range Status   MRSA by PCR NEGATIVE NEGATIVE Final    Comment:        The GeneXpert MRSA Assay  (FDA approved for NASAL specimens only), is one component of a comprehensive MRSA colonization surveillance program. It is not intended to diagnose MRSA infection nor to guide or monitor treatment for MRSA infections. Performed at [facility], [place] [address] [place], [place] [address]   Culture, blood (routine x 2)     Status: None   Collection Time: 08/04/17 11:15 AM  Result Value Ref Range Status   Specimen Description BLOOD RIGHT ANTECUBITAL  Final   Special Requests   Final    BOTTLES DRAWN AEROBIC AND ANAEROBIC Blood Culture adequate volume   Culture   Final    NO GROWTH 5 DAYS Performed at [facility], [place] [address], [place], [place] [address]    Report Status 08/09/2017 FINAL  Final  Culture, blood (routine x 2)     Status: None   Collection Time: 08/04/17 11:30 AM  Result Value Ref Range Status   Specimen Description BLOOD RIGHT FOREARM  Final   Special Requests   Final    BOTTLES DRAWN AEROBIC AND ANAEROBIC Blood Culture adequate volume   Culture   Final    NO GROWTH 5 DAYS Performed at  [facility], [place] [address], [place], [place] [address]    Report Status 08/09/2017 FINAL  Final      Radiology Studies: No results found. Scheduled Meds: . aspirin EC  81 mg Oral Daily  . enoxaparin (LOVENOX) injection  40 mg Subcutaneous Q24H  . ferrous gluconate  324 mg Oral Q breakfast  . fluticasone  1 spray Each Nare Daily  . furosemide  40 mg Oral BID  . hydrALAZINE  25 mg Oral BID  . insulin aspart  0-9 Units Subcutaneous TID WC  . tamsulosin  0.4 mg Oral QPC supper   Continuous Infusions: . ceFEPime (MAXIPIME) IV Stopped (08/13/17 6045)     LOS: 10 days    Velvet Bathe, MD [facility] Pager [phone]  If 7PM-7AM, please contact night-coverage [email or web address] Password [facility] 08/13/2017, 5:57 PM

## 2017-08-13 NOTE — Progress Notes (Signed)
Occupational Therapy Treatment Patient Details Name: Juan [name] MRN: [id number] DOB: Oct 29, 1940 Today's Date: 08/13/2017    History of present illness Pt adm with recurrent UTI. 77 year old male with PMH of CAD, chronic diastolic CHF, HTN, DM, OSA not on CPAP, chronic hypoxic respiratory failure on 2 L/min oxygen, severe pulmonary hypertension, AAA, MDS, iron deficiency anemia, recent hospitalization 2/19-2/23 for decompensated CHF. Pt also with recent adm for recurrent UTI.   OT comments  Pt progressing towards established OT goals. Pt performing ADLs and functional mobility at [facility] A level for safety. Required Max cues throughout session for sequencing of hand hygiene and functional mobility with RW. Continue to recommend dc home with HHOT and 24 hour supervision. Will continue to follow acutely as admitted.    Follow Up Recommendations  Home health OT;Supervision/Assistance - 24 hour    Equipment Recommendations  None recommended by OT    Recommendations for Other Services      Precautions / Restrictions Precautions Precautions: Fall Restrictions Weight Bearing Restrictions: No       Mobility Bed Mobility Overal bed mobility: Modified Independent Bed Mobility: Supine to Sit;Sit to Supine     Supine to sit: Modified independent (Device/Increase time);HOB elevated Sit to supine: Modified independent (Device/Increase time);HOB elevated   General bed mobility comments: Incr time  Transfers Overall transfer level: Needs assistance Equipment used: Rolling walker (2 wheeled) Transfers: Sit to/from Stand Sit to Stand: From elevated surface;Min guard         General transfer comment: Elevated bed and then pt abel to power up into standing with MIn Guard A for safety    Balance Overall balance assessment: Needs assistance Sitting-balance support: No upper extremity supported;Feet supported Sitting balance-Leahy Scale: Good     Standing balance support: Bilateral  upper extremity supported Standing balance-Leahy Scale: Poor Standing balance comment: walker for support                           ADL either performed or assessed with clinical judgement   ADL Overall ADL's : Needs assistance/impaired     Grooming: Min guard;Wash/dry hands;Standing Grooming Details (indicate cue type and reason): Pt performing hand hygiene at sink. Requiring Max cues to use soap.                             Functional mobility during ADLs: Min guard;Rolling walker;Cueing for safety;Cueing for sequencing General ADL Comments: Pt demonstrating decreased cognition and problem solving. Requiring Max cues for sequencing hand hygiene and functional mobiltiy with RW     Vision       Perception     Praxis      Cognition Arousal/Alertness: Awake/alert Behavior During Therapy: WFL for tasks assessed/performed Overall Cognitive Status: Impaired/Different from baseline Area of Impairment: Following commands;Problem solving                       Following Commands: Follows one step commands inconsistently;Follows one step commands with increased time     Problem Solving: Difficulty sequencing;Requires verbal cues;Requires tactile cues General Comments: During functional mobility around room, pt requiring Max cues to navigate and problem solve a correct route from bed to sink and then sink to recliner.        Exercises     Shoulder Instructions       General Comments SpO2 95% on 2L    Pertinent Vitals/ Pain  Pain Assessment: Faces Faces Pain Scale: Hurts a little bit Pain Location: Generalized Pain Descriptors / Indicators: Sore;Guarding Pain Intervention(s): Monitored during session;Repositioned  Home Living                                          Prior Functioning/Environment              Frequency  Min 2X/week        Progress Toward Goals  OT Goals(current goals can now be found  in the care plan section)  Progress towards OT goals: Progressing toward goals  Acute Rehab OT Goals Patient Stated Goal: not stated OT Goal Formulation: With patient Time For Goal Achievement: 08/23/17 Potential to Achieve Goals: Fair ADL Goals Pt Will Perform Grooming: with modified independence;standing;sitting Pt Will Perform Upper Body Bathing: with set-up;with supervision;sitting Pt Will Perform Lower Body Bathing: with min guard assist;sit to/from stand;with set-up Pt Will Perform Upper Body Dressing: with set-up;with supervision;sitting Pt Will Perform Lower Body Dressing: with set-up;with supervision;sit to/from stand Pt Will Transfer to Toilet: with modified independence;ambulating Pt Will Perform Toileting - Clothing Manipulation and hygiene: with modified independence;sitting/lateral leans;sit to/from stand  Plan Discharge plan remains appropriate    Co-evaluation                 AM-PAC PT "6 Clicks" Daily Activity     Outcome Measure   Help from another person eating meals?: None Help from another person taking care of personal grooming?: A Little Help from another person toileting, which includes using toliet, bedpan, or urinal?: A Little Help from another person bathing (including washing, rinsing, drying)?: A Little Help from another person to put on and taking off regular upper body clothing?: None Help from another person to put on and taking off regular lower body clothing?: A Little 6 Click Score: 20    End of Session Equipment Utilized During Treatment: Rolling walker;Gait belt  OT Visit Diagnosis: Unsteadiness on feet (R26.81);Muscle weakness (generalized) (M62.81);Pain   Activity Tolerance Patient limited by pain   Patient Left with call [place]/phone within reach;in chair;with chair alarm set(No batteries in chair alarm; rn notified)   Nurse Communication Other (comment);Mobility status(Chair alarm)        Time: 1321-1340 OT Time Calculation  (min): 19 min  Charges: OT General Charges $OT Visit: 1 Visit OT Treatments $Self Care/Home Management : 8-22 mins  Ward, OTR/L Acute Rehab Pager: [phone] Office: [place] 08/13/2017, 1:50 PM

## 2017-08-14 LAB — GLUCOSE, CAPILLARY
GLUCOSE-CAPILLARY: 81 mg/dL (ref 65–99)
GLUCOSE-CAPILLARY: 105 mg/dL — AB (ref 65–99)
Glucose-Capillary: 87 mg/dL (ref 65–99)
Glucose-Capillary: 105 mg/dL — ABNORMAL HIGH (ref 65–99)

## 2017-08-14 NOTE — Progress Notes (Signed)
Patient ID: Juan [name], male   DOB: 10/25/1940, 77 y.o.   MRN: [id number]  PROGRESS NOTE    Omarion Minnehan  BMW:[id number] DOB: 07/21/1940 DOA: 08/03/2017 PCP: Colon Branch, MD   Brief Narrative:  77 year old male with history of diabetes mellitus, myeloproliferative disorder, diastolic CHF, chronic hypoxic respiratory failure on home oxygen, chronic kidney disease stage III, pulmonary hypertension, CVA, OSA, BPH with urinary retention, gout and recent discharge from hospital on 07/22/2017 for UTI secondary to Pseudomonas infection which was treated with intravenous antibiotics/cefepime for 7-day course presented on 08/03/2017 with dysuria and bilateral flank pain.  CT abdomen and pelvis showed right hydroureteronephrosis and resolved left hydronephrosis.  ED provider consulted with ID/Dr.Snyder on phone and patient was started on IV cefepime.  Patient reports that God has directed him not to get a PICC line placed. He states that he has a special needs son at home who will not be able to help him get treatment.   Psychiatry has deemed patient not to have capacity to make his own medical decision.   Assessment & Plan:   Principal Problem:   Evaluation by psychiatric service required Active Problems:   Myeloproliferative disease ([place])   Obstructive sleep apnea   Chronic diastolic CHF (congestive heart failure) (HCC)   BPH (benign prostatic hyperplasia)   CKD (chronic kidney disease) stage 3, GFR 30-59 ml/min (HCC)   Hydronephrosis   Type II diabetes mellitus ([place])   Pulmonary hypertension severe   Hypertrophy of prostate with urinary retention   UTI (urinary tract infection)   Probable complicated UTI in a patient with recent Pseudomonas UTI - Pt on antibiotic regimen  Leukocytosis -Probably secondary to above. Pt currently on appropriate antibiotic regimen. Will aim for longer duration of antibiotic therapy 14 days total currently at 12/14 - d/c after antibiotic regimen completed.  DM2 -  Most likely diet-controlled.  Not on oral meds at home - Accu-Cheks with insulin sliding scale coverage  Chronic diastolic CHF - Currently compensated.  Lasix on hold because patient received contrast for CT abdomen yesterday - If a.m. creatinine remained stable, home Lasix can be restarted  CKD stage 3 -Stable currently.  BPH with history of urinary retention-  - Urology consulted please refer to last note. Plan is to leave foley in place and treat infection.  - Continue home tamsulosin  DVT prophylaxis: Lovenox Code Status: Full Family Communication: None at bedside Disposition Plan: d/c home once patient completes antibiotic regimen for a 14 total days.  Consultants: Urology  Procedures: None  Antimicrobials: Cefepime from 08/03/2017 onwards  Subjective: Pt reports no new complaint currently.  Objective: Vitals:   08/13/17 1322 08/13/17 2103 08/14/17 0548 08/14/17 0801  BP: (!) 135/116 (!) 150/70 140/61 (!) 140/55  Pulse: (!) 57 (!) 51 (!) 54   Resp: 18 18 18    Temp: 97.9 F (36.6 C) (!) 97.5 F (36.4 C) 98.2 F (36.8 C)   TempSrc: Oral Oral Oral   SpO2: 100% 100% 98%   Weight:      Height:        Intake/Output Summary (Last 24 hours) at 08/14/2017 1415 Last data filed at 08/14/2017 0551 Gross per 24 hour  Intake 180 ml  Output 700 ml  Net -520 ml   Filed Weights   08/04/17 1946  Weight: 81.1 kg (178 lb 12.7 oz)    Examination: Exam unchanged when compared to 08/13/2017  General exam: Appears calm and comfortable.  No distress Respiratory system: Bilateral decreased breath sound  at bases Cardiovascular system: S1 & S2 heard, rate controlled  gastrointestinal system: Abdomen is nondistended, soft. Normal bowel sounds heard.  Mild suprapubic tenderness and left CVA tenderness Central nervous system: Alert and oriented. No focal neurological deficits. Moving extremities Extremities: No cyanosis, clubbing; trace edema  Skin: Chronic stasis changes in  bilateral lower extremities Psychiatry: Mood & affect appropriate.    Data Reviewed: I have personally reviewed following labs and imaging studies  CBC: Recent Labs  Lab 08/09/17 0825  WBC 18.2*  HGB 8.8*  HCT 29.5*  MCV 89.1  PLT 893   Basic Metabolic Panel: Recent Labs  Lab 08/09/17 0825  NA 141  K 4.7  CL 110  CO2 25  GLUCOSE 96  BUN 43*  CREATININE 1.11  CALCIUM 9.7   GFR: Estimated Creatinine Clearance: 66 mL/min (by C-G formula based on SCr of 1.11 mg/dL). Liver Function Tests: No results for input(s): AST, ALT, ALKPHOS, BILITOT, PROT, ALBUMIN in the last 168 hours. No results for input(s): LIPASE, AMYLASE in the last 168 hours. No results for input(s): AMMONIA in the last 168 hours. Coagulation Profile: No results for input(s): INR, PROTIME in the last 168 hours. Cardiac Enzymes: No results for input(s): CKTOTAL, CKMB, CKMBINDEX, TROPONINI in the last 168 hours. BNP (last 3 results) No results for input(s): PROBNP in the last 8760 hours. HbA1C: No results for input(s): HGBA1C in the last 72 hours. CBG: Recent Labs  Lab 08/13/17 0750 08/13/17 1156 08/13/17 2101 08/14/17 0750 08/14/17 1202  GLUCAP 88 107* 85 81 105*   Lipid Profile: No results for input(s): CHOL, HDL, LDLCALC, TRIG, CHOLHDL, LDLDIRECT in the last 72 hours. Thyroid Function Tests: No results for input(s): TSH, T4TOTAL, FREET4, T3FREE, THYROIDAB in the last 72 hours. Anemia Panel: No results for input(s): VITAMINB12, FOLATE, FERRITIN, TIBC, IRON, RETICCTPCT in the last 72 hours. Sepsis Labs: No results for input(s): PROCALCITON, LATICACIDVEN in the last 168 hours.  No results found for this or any previous visit (from the past 240 hour(s)).    Radiology Studies: No results found. Scheduled Meds: . aspirin EC  81 mg Oral Daily  . enoxaparin (LOVENOX) injection  40 mg Subcutaneous Q24H  . ferrous gluconate  324 mg Oral Q breakfast  . fluticasone  1 spray Each Nare Daily  .  furosemide  40 mg Oral BID  . hydrALAZINE  25 mg Oral BID  . insulin aspart  0-9 Units Subcutaneous TID WC  . tamsulosin  0.4 mg Oral QPC supper   Continuous Infusions: . ceFEPime (MAXIPIME) IV Stopped (08/14/17 0831)     LOS: 11 days    Velvet Bathe, MD [facility] Pager [phone]  If 7PM-7AM, please contact night-coverage [email or web address] Password [email or web address] 08/14/2017, 2:15 PM

## 2017-08-15 LAB — GLUCOSE, CAPILLARY
GLUCOSE-CAPILLARY: 92 mg/dL (ref 65–99)
GLUCOSE-CAPILLARY: 104 mg/dL — AB (ref 65–99)
GLUCOSE-CAPILLARY: 102 mg/dL — AB (ref 65–99)
GLUCOSE-CAPILLARY: 131 mg/dL — AB (ref 65–99)

## 2017-08-15 NOTE — Progress Notes (Signed)
Physical Therapy Treatment Patient Details Name: Juan [name] MRN: [id number] DOB: May 16, 1941 Today's Date: 08/15/2017    History of Present Illness Pt adm with recurrent UTI. 77 year old male with PMH of CAD, chronic diastolic CHF, HTN, DM, OSA not on CPAP, chronic hypoxic respiratory failure on 2 L/min oxygen, severe pulmonary hypertension, AAA, MDS, iron deficiency anemia, recent hospitalization 2/19-2/23 for decompensated CHF. Pt also with recent adm for recurrent UTI.    PT Comments    Patient progressing slowly towards PT goals. Pt with left knee instability and buckling today during gait training requiring Min A for balance/support. Pt unaware of deficits and safety. Only will stand from EOB with height completely elevated so it stands him up almost like a lift chair- refused to try any other way. Disposition updated to home with HHPT secondary to safety concerns and weakness. Will follow.   Follow Up Recommendations  Home health PT;Supervision - Intermittent     Equipment Recommendations  None recommended by PT    Recommendations for Other Services       Precautions / Restrictions Precautions Precautions: Fall Restrictions Weight Bearing Restrictions: No    Mobility  Bed Mobility Overal bed mobility: Modified Independent Bed Mobility: Supine to Sit           General bed mobility comments: Incr time, use of rail.  Transfers Overall transfer level: Needs assistance Equipment used: Rolling walker (2 wheeled) Transfers: Sit to/from Stand Sit to Stand: From elevated surface;Min guard         General transfer comment: Pt would not attempt to stand until bed height was almost lifting pt up off bottom. Transferred to chair post ambulation.  Ambulation/Gait Ambulation/Gait assistance: Min assist Ambulation Distance (Feet): 110 Feet Assistive device: Rolling walker (2 wheeled) Gait Pattern/deviations: Step-through pattern;Decreased stride length;Trunk flexed Gait  velocity: decr   General Gait Details: Left knee instability noted with buckling x2. Cues for posture and RW proximity.    Stairs            Wheelchair Mobility    Modified Rankin (Stroke Patients Only)       Balance Overall balance assessment: Needs assistance Sitting-balance support: Feet supported;No upper extremity supported Sitting balance-Leahy Scale: Good     Standing balance support: During functional activity Standing balance-Leahy Scale: Poor Standing balance comment: walker for support                            Cognition Arousal/Alertness: Awake/alert Behavior During Therapy: WFL for tasks assessed/performed Overall Cognitive Status: Impaired/Different from baseline Area of Impairment: Following commands;Problem solving;Safety/judgement                 Orientation Level: Disoriented to;Time;Situation     Following Commands: Follows one step commands inconsistently;Follows one step commands with increased time Safety/Judgement: Decreased awareness of safety;Decreased awareness of deficits   Problem Solving: Difficulty sequencing;Requires verbal cues;Requires tactile cues General Comments: pt adamant about doing things his own way despite cues/education.      Exercises      General Comments General comments (skin integrity, edema, etc.): Sp02 remained >93% on 2L/min 02.      Pertinent Vitals/Pain Pain Assessment: Faces Faces Pain Scale: Hurts a little bit Pain Location: left hip Pain Descriptors / Indicators: Sore Pain Intervention(s): Monitored during session;Repositioned    Home Living                      Prior Function  PT Goals (current goals can now be found in the care plan section) Progress towards PT goals: Progressing toward goals(slowly)    Frequency    Min 3X/week      PT Plan Discharge plan needs to be updated    Co-evaluation              AM-PAC PT "6 Clicks" Daily  Activity  Outcome Measure  Difficulty turning over in bed (including adjusting bedclothes, sheets and blankets)?: None Difficulty moving from lying on back to sitting on the side of the bed? : None Difficulty sitting down on and standing up from a chair with arms (e.g., wheelchair, bedside commode, etc,.)?: Unable Help needed moving to and from a bed to chair (including a wheelchair)?: A Little Help needed walking in hospital room?: A Little Help needed climbing 3-5 steps with a railing? : A Lot 6 Click Score: 17    End of Session Equipment Utilized During Treatment: Gait belt Activity Tolerance: Patient tolerated treatment well Patient left: in chair;with call Jun/phone within reach;with chair alarm set Nurse Communication: Mobility status PT Visit Diagnosis: Other abnormalities of gait and mobility (R26.89);Difficulty in walking, not elsewhere classified (R26.2);Muscle weakness (generalized) (M62.81)     Time: 2010-0712 PT Time Calculation (min) (ACUTE ONLY): 23 min  Charges:  $Gait Training: 8-22 mins $Therapeutic Exercise: 8-22 mins                    G Codes:       Wray Kearns, PT, DPT [phone]     Marguarite Arbour A Kaleiah Kutzer 08/15/2017, 12:32 PM

## 2017-08-15 NOTE — Progress Notes (Signed)
Patient ID: Juan [name], male   DOB: January 21, 1941, 77 y.o.   MRN: [id number]  PROGRESS NOTE    Juan [name]  WCB:[id number] DOB: 1940-06-05 DOA: 08/03/2017 PCP: Colon Branch, MD   Brief Narrative:  77 year old male with history of diabetes mellitus, myeloproliferative disorder, diastolic CHF, chronic hypoxic respiratory failure on home oxygen, chronic kidney disease stage III, pulmonary hypertension, CVA, OSA, BPH with urinary retention, gout and recent discharge from hospital on 07/22/2017 for UTI secondary to Pseudomonas infection which was treated with intravenous antibiotics/cefepime for 7-day course presented on 08/03/2017 with dysuria and bilateral flank pain.  CT abdomen and pelvis showed right hydroureteronephrosis and resolved left hydronephrosis.  ED provider consulted with ID/Dr.Snyder on phone and patient was started on IV cefepime.  Patient reports that God has directed him not to get a PICC line placed. He states that he has a special needs son at home who will not be able to help him get treatment.   Psychiatry has deemed patient not to have capacity to make his own medical decision.   Assessment & Plan:   Principal Problem:   Evaluation by psychiatric service required Active Problems:   Myeloproliferative disease ([place])   Obstructive sleep apnea   Chronic diastolic CHF (congestive heart failure) (HCC)   BPH (benign prostatic hyperplasia)   CKD (chronic kidney disease) stage 3, GFR 30-59 ml/min (HCC)   Hydronephrosis   Type II diabetes mellitus (HCC)   Pulmonary hypertension severe   Hypertrophy of prostate with urinary retention   UTI (urinary tract infection)   Probable complicated UTI in a patient with recent Pseudomonas UTI - continue current antibiotic regimen.   Leukocytosis -Probably secondary to above. Pt currently on appropriate antibiotic regimen. Will aim for longer duration of antibiotic therapy 14 days total currently at 13/14 - d/c after antibiotic regimen  completed.  DM2 - Most likely diet-controlled.  Not on oral meds at home - Accu-Cheks with insulin sliding scale coverage  Chronic diastolic CHF - Currently compensated.  Lasix on hold because patient received contrast for CT abdomen yesterday - If a.m. creatinine remained stable, home Lasix can be restarted  CKD stage 3 -Stable currently.  BPH with history of urinary retention-  - Urology consulted please refer to last note. Plan is to leave foley in place and treat infection.  - Continue home tamsulosin  DVT prophylaxis: Lovenox Code Status: Full Family Communication: None at bedside Disposition Plan: d/c home once patient completes antibiotic regimen for a 14 total days.  Consultants: Urology  Procedures: None  Antimicrobials: Cefepime from 08/03/2017 onwards  Subjective: No new complaints reported. No acute issues overnight.   Objective: Vitals:   08/14/17 2100 08/15/17 0528 08/15/17 0930 08/15/17 0935  BP: (!) 144/65 (!) 143/66 (!) 161/79 (!) 161/72  Pulse: 75 (!) 48  61  Resp: 12 12    Temp: 98 F (36.7 C) 98.2 F (36.8 C)    TempSrc: Oral Oral    SpO2: 100% 100%    Weight:      Height:        Intake/Output Summary (Last 24 hours) at 08/15/2017 1518 Last data filed at 08/15/2017 0530 Gross per 24 hour  Intake -  Output 1600 ml  Net -1600 ml   Filed Weights   08/04/17 1946  Weight: 81.1 kg (178 lb 12.7 oz)    Examination: Exam unchanged when compared to 08/14/2017  General exam: Appears calm and comfortable.  No distress Respiratory system: Bilateral decreased breath sound at bases  Cardiovascular system: S1 & S2 heard, rate controlled  gastrointestinal system: Abdomen is nondistended, soft. Normal bowel sounds heard.  Mild suprapubic tenderness and left CVA tenderness Central nervous system: Alert and oriented. No focal neurological deficits. Moving extremities Extremities: No cyanosis, clubbing; trace edema  Skin: Chronic stasis changes in  bilateral lower extremities Psychiatry: Mood & affect appropriate.    Data Reviewed: I have personally reviewed following labs and imaging studies  CBC: Recent Labs  Lab 08/09/17 0825  WBC 18.2*  HGB 8.8*  HCT 29.5*  MCV 89.1  PLT 675   Basic Metabolic Panel: Recent Labs  Lab 08/09/17 0825  NA 141  K 4.7  CL 110  CO2 25  GLUCOSE 96  BUN 43*  CREATININE 1.11  CALCIUM 9.7   GFR: Estimated Creatinine Clearance: 66 mL/min (by C-G formula based on SCr of 1.11 mg/dL). Liver Function Tests: No results for input(s): AST, ALT, ALKPHOS, BILITOT, PROT, ALBUMIN in the last 168 hours. No results for input(s): LIPASE, AMYLASE in the last 168 hours. No results for input(s): AMMONIA in the last 168 hours. Coagulation Profile: No results for input(s): INR, PROTIME in the last 168 hours. Cardiac Enzymes: No results for input(s): CKTOTAL, CKMB, CKMBINDEX, TROPONINI in the last 168 hours. BNP (last 3 results) No results for input(s): PROBNP in the last 8760 hours. HbA1C: No results for input(s): HGBA1C in the last 72 hours. CBG: Recent Labs  Lab 08/14/17 1202 08/14/17 1636 08/14/17 2102 08/15/17 0741 08/15/17 1234  GLUCAP 105* 87 105* 92 104*   Lipid Profile: No results for input(s): CHOL, HDL, LDLCALC, TRIG, CHOLHDL, LDLDIRECT in the last 72 hours. Thyroid Function Tests: No results for input(s): TSH, T4TOTAL, FREET4, T3FREE, THYROIDAB in the last 72 hours. Anemia Panel: No results for input(s): VITAMINB12, FOLATE, FERRITIN, TIBC, IRON, RETICCTPCT in the last 72 hours. Sepsis Labs: No results for input(s): PROCALCITON, LATICACIDVEN in the last 168 hours.  No results found for this or any previous visit (from the past 240 hour(s)).    Radiology Studies: No results found. Scheduled Meds: . aspirin EC  81 mg Oral Daily  . enoxaparin (LOVENOX) injection  40 mg Subcutaneous Q24H  . ferrous gluconate  324 mg Oral Q breakfast  . fluticasone  1 spray Each Nare Daily  .  furosemide  40 mg Oral BID  . hydrALAZINE  25 mg Oral BID  . insulin aspart  0-9 Units Subcutaneous TID WC  . tamsulosin  0.4 mg Oral QPC supper   Continuous Infusions: . ceFEPime (MAXIPIME) IV Stopped (08/15/17 1215)     LOS: 12 days    Velvet Bathe, MD [facility] Pager [phone]  If 7PM-7AM, please contact night-coverage [email or web address] Password [facility] 08/15/2017, 3:18 PM

## 2017-08-16 ENCOUNTER — Telehealth (HOSPITAL_COMMUNITY): Payer: Self-pay | Admitting: Surgery

## 2017-08-16 LAB — GLUCOSE, CAPILLARY: GLUCOSE-CAPILLARY: 101 mg/dL — AB (ref 65–99)

## 2017-08-16 NOTE — Progress Notes (Signed)
Patient discharge teaching given, including activity, diet, follow-up appoints, and medications. Patient verbalized understanding of all discharge instructions. IV access was d/c'd. Vitals are stable. Skin is intact except as charted in most recent assessments. Pt to be escorted out by PTAR to be driven home.

## 2017-08-16 NOTE — Telephone Encounter (Signed)
I attempted to contact Juan [name] to discuss his interest in the [place].  I was unable to reach him or leave a message.  He has refused in the past and has been difficult to make contact with/schedule home visits.  We will discharge [name] at this time from the program secondary to these reasons.

## 2017-08-16 NOTE — Care Management Note (Addendum)
Case Management Note  Patient Details  Name: Juan [name] MRN: [id number] Date of Birth: 11-27-1940  Subjective/Objective:     Admitted with recurrent UTI/ right hydroureteronephrosis and resolved left hydronephrosis along with distended urinary bladder.Foley placed per urology. From home with son. DME: home oxygen.                08/16/2017 @ 10:27 NCM called [facility] and made arrangements for pt's transportation to home. Pt scheduled for pick @ 11:30am, pt and nurse made aware.       PCP: [name]  Action/Plan: Transition to home today with home health services to follow. Pt will need transportation ton home . NCM will arrange transportation to home with [facility].  Expected Discharge Date:     08/16/2017             Expected Discharge Plan:  Home with home health services  In-House Referral:  Clinical Social Work  Discharge planning Services  CM Consult  Post Acute Care Choice:    Choice offered to:   patient  DME Arranged:   N/A DME Agency:   N/A  HH Arranged:   RN,PT,OT,SW HH Agency:   [name]  Status of Service:  completed  If discussed at [place] of Stay Meetings, dates discussed:    Additional Comments:  Sharin Mons, RN 08/16/2017, 10:05 AM

## 2017-08-16 NOTE — Discharge Summary (Signed)
Physician Discharge Summary  Juan [name] WPY:[id number] DOB: [birth year] DOA: 08/03/2017  PCP: Colon Branch, MD  Admit date: 08/03/2017 Discharge date: 08/16/2017  Time spent: 35 minutes  Recommendations for Outpatient Follow-up:  1. Please ensure patient follows up with urologist for continued recommendations regarding his indwelling foley 2. Of note psychiatry consulted while patient was in house for medical competence and was not found to be competent to make his own medical decisions.   Discharge Diagnoses:  Principal Problem:   Evaluation by psychiatric service required Active Problems:   Myeloproliferative disease ([place])   Obstructive sleep apnea   Chronic diastolic CHF (congestive heart failure) (HCC)   BPH (benign prostatic hyperplasia)   CKD (chronic kidney disease) stage 3, GFR 30-59 ml/min (HCC)   Hydronephrosis   Type II diabetes mellitus (HCC)   Pulmonary hypertension severe   Hypertrophy of prostate with urinary retention   UTI (urinary tract infection)   Discharge Condition: stable  Diet recommendation: heart healthy/carb modified  Filed Weights   08/04/17 1946  Weight: 81.1 kg (178 lb 12.7 oz)    History of present illness:  77 year old male with history of diabetes mellitus, myeloproliferative disorder, diastolic CHF, chronic hypoxic respiratory failure on home oxygen, chronic kidney disease stage III, pulmonary hypertension, CVA, OSA, BPH with urinary retention, gout and recent discharge from hospital on 07/22/2017 for UTI secondary to Pseudomonas infection which was treated with intravenous antibiotics/cefepime for 7-day course presented on 08/03/2017 with dysuria and bilateral flank pain.  CT abdomen and pelvis showed right hydroureteronephrosis and resolved left hydronephrosis.  Hospital Course:  Probable complicated UTI in a patient with recurrent Pseudomonas UTI - Treated with IV antibiotics (appropriate coverage based on sensitivities) for 14 days in house.  -  pt to transition home with home health nursing, PT, OT, social services.  - recommend f/u with urologist for further evaluation and recommendations.   Leukocytosis -Pt has 14 days of antibiotics. Clinically improved. May be rechecked as outpatient.   DM2 - Most likely diet-controlled.  Not on oral meds at home  Chronic diastolic CHF - Continue prior to admission medication regimen.   CKD stage 3 -Stable currently.  BPHwith history of urinary retention - Urology consulted please refer to last note. Plan is to leave foley in place and treat infection.  - Continue home tamsulosin  Procedures:  none  Consultations:  Urology  Psychiatry  Discharge Exam: Vitals:   08/15/17 2100 08/16/17 0500  BP: (!) 141/62 134/65  Pulse: (!) 56 72  Resp: 17 18  Temp: 98 F (36.7 C) (!) 97.4 F (36.3 C)  SpO2: 100% 93%    General: Pt in nad, alert and awake Cardiovascular: rrr, no rubs Respiratory: no increased wob, no wheezes  Discharge Instructions   Discharge Instructions    Call MD for:  severe uncontrolled pain   Complete by:  As directed    Call MD for:  temperature >100.4   Complete by:  As directed    Diet - low sodium heart healthy   Complete by:  As directed    Discharge instructions   Complete by:  As directed    I recommend you follow up with your urologist after hospital discharge. Have foley replaced once every month or as recommended by your urologist.   Increase activity slowly   Complete by:  As directed      Allergies as of 08/16/2017   No Known Allergies     Medication List    TAKE these medications  acetaminophen 325 MG tablet Commonly known as:  TYLENOL Take 2 tablets (650 mg total) by mouth every 6 (six) hours as needed for mild pain (or Fever >/= 101).   aspirin EC 81 MG tablet Take 81 mg by mouth daily.   b complex vitamins tablet Take 1 tablet by mouth daily.   benzonatate 100 MG capsule Commonly known as:  TESSALON Take 1  capsule (100 mg total) by mouth 2 (two) times daily as needed for cough.   ferrous gluconate 240 (27 FE) MG tablet Commonly known as:  FERGON Take 240 mg by mouth daily.   furosemide 40 MG tablet Commonly known as:  LASIX Take 80mg  (2tabs) in morning and 40mg  (1tab) in evening   hydrALAZINE 25 MG tablet Commonly known as:  APRESOLINE Take 25 mg by mouth 2 (two) times daily.   OXYGEN Inhale 2 L into the lungs continuous.   tamsulosin 0.4 MG Caps capsule Commonly known as:  FLOMAX Take 1 capsule (0.4 mg total) by mouth daily after supper.      No Known Allergies Follow-up Information    Ardis Hughs, MD Follow up.   Specialty:  Urology Why:  2 weeks Contact information: [place] [address] [phone]        Care, [facility] Follow up.   Specialty:  Home Health Services Why:  Home health services arranged Contact information: [place] [address] [phone]            The results of significant diagnostics from this hospitalization (including imaging, microbiology, ancillary and laboratory) are listed below for reference.    Significant Diagnostic Studies: Ct Abdomen Pelvis W Contrast  Result Date: 08/03/2017 CLINICAL DATA:  Lower abdominal pain. EXAM: CT ABDOMEN AND PELVIS WITH CONTRAST TECHNIQUE: Multidetector CT imaging of the abdomen and pelvis was performed using the standard protocol following bolus administration of intravenous contrast. CONTRAST:  139mL ISOVUE-300 IOPAMIDOL (ISOVUE-300) INJECTION 61% COMPARISON:  06/07/2017 FINDINGS: Lower chest: Motion artifact and subsegmental atelectasis in the lung bases. No pleural effusion. Coronary artery atherosclerosis. Unchanged dilatation of the main pulmonary artery to 5 cm diameter as can be seen with pulmonary arterial hypertension. Hepatobiliary: No focal liver abnormality. Moderately distended gallbladder with possible punctate stone but no  gallbladder wall thickening or other acute inflammatory changes. No biliary dilatation. Pancreas: Unremarkable. Spleen: Unchanged splenomegaly. Apparent decreased size of peripheral wedge-shaped hypodensity suggesting evolving infarct. Adrenals/Urinary Tract: Unremarkable adrenal glands. Unchanged 2.7 cm right lower pole renal cyst. Mild to moderate right hydroureteronephrosis has mildly improved. Left hydronephrosis on the prior study has resolved. Distended, thick-walled bladder containing multiple diverticula is similar to the prior study. Stomach/Bowel: The stomach is within normal limits. No bowel obstruction. Extensive descending and sigmoid colon diverticulosis without significant acute inflammatory changes identified within limitations of motion artifact. Scattered locules scratched at scattered small locules of gas in the right mid to upper abdomen are favored to be within decompressed small bowel loops rather than extraluminal. The appendix is not well visualized, with motion artifact limiting assessment. No inflammatory changes are seen in the right lower quadrant to suggest acute appendicitis. Vascular/Lymphatic: Abdominal aortic atherosclerosis without aneurysm. No enlarged lymph nodes. Reproductive: At most mild prostatic enlargement, unchanged. Other: No intraperitoneal free fluid. Unchanged 1.8 cm subcutaneous soft tissue nodule in the left inguinal region. Mild diffuse body wall edema. Musculoskeletal: Diffuse heterogeneous sclerosis throughout the visualized osseous structures, unchanged. IMPRESSION: 1. No acute abnormality identified in the abdomen or  pelvis. 2. Mildly improved right hydroureteronephrosis. Resolved left hydronephrosis. 3. Unchanged distension of a thick-walled urinary bladder containing multiple diverticula. 4. Unchanged splenomegaly with evolving infarct as previously seen. 5. Unchanged diffusely abnormal appearance of the bones consistent with known myeloproliferative disorder.  6. Colonic diverticulosis. 7.  Aortic Atherosclerosis (ICD10-I70.0). Electronically Signed   By: Logan Bores M.D.   On: 08/03/2017 17:49   [place] Ekg Site Rite  Result Date: 08/06/2017 If Site Rite image not attached, placement could not be confirmed due to current cardiac rhythm.   Microbiology: No results found for this or any previous visit (from the past 240 hour(s)).   Labs: Basic Metabolic Panel: No results for input(s): NA, K, CL, CO2, GLUCOSE, BUN, CREATININE, CALCIUM, MG, PHOS in the last 168 hours. Liver Function Tests: No results for input(s): AST, ALT, ALKPHOS, BILITOT, PROT, ALBUMIN in the last 168 hours. No results for input(s): LIPASE, AMYLASE in the last 168 hours. No results for input(s): AMMONIA in the last 168 hours. CBC: No results for input(s): WBC, NEUTROABS, HGB, HCT, MCV, PLT in the last 168 hours. Cardiac Enzymes: No results for input(s): CKTOTAL, CKMB, CKMBINDEX, TROPONINI in the last 168 hours. BNP: BNP (last 3 results) Recent Labs    01/06/17 0931 07/06/17 1317 07/16/17 0558  BNP 435.8* 419.5* 499.9*    ProBNP (last 3 results) No results for input(s): PROBNP in the last 8760 hours.  CBG: Recent Labs  Lab 08/15/17 0741 08/15/17 1234 08/15/17 1712 08/15/17 2128 08/16/17 0900  GLUCAP 92 104* 102* 131* 101*     Signed:  Velvet Bathe MD.  [facility] 08/16/2017, 10:13 AM

## 2017-08-18 ENCOUNTER — Emergency Department (HOSPITAL_COMMUNITY): Payer: Medicare Other

## 2017-08-18 ENCOUNTER — Inpatient Hospital Stay (HOSPITAL_COMMUNITY)
Admission: EM | Admit: 2017-08-18 | Discharge: 2017-08-24 | DRG: 689 | Disposition: A | Discharge status: Home Health | Payer: Medicare Other | Attending: Family Medicine | Admitting: Family Medicine

## 2017-08-18 ENCOUNTER — Inpatient Hospital Stay (HOSPITAL_COMMUNITY): Payer: Medicare Other

## 2017-08-18 ENCOUNTER — Encounter (HOSPITAL_COMMUNITY): Payer: Self-pay | Admitting: Emergency Medicine

## 2017-08-18 DIAGNOSIS — Z1621 Resistance to vancomycin: Secondary | ICD-10-CM | POA: Diagnosis present

## 2017-08-18 DIAGNOSIS — E860 Dehydration: Secondary | ICD-10-CM | POA: Diagnosis present

## 2017-08-18 DIAGNOSIS — R0902 Hypoxemia: Secondary | ICD-10-CM | POA: Diagnosis not present

## 2017-08-18 DIAGNOSIS — N390 Urinary tract infection, site not specified: Secondary | ICD-10-CM

## 2017-08-18 DIAGNOSIS — Z87891 Personal history of nicotine dependence: Secondary | ICD-10-CM

## 2017-08-18 DIAGNOSIS — B965 Pseudomonas (aeruginosa) (mallei) (pseudomallei) as the cause of diseases classified elsewhere: Secondary | ICD-10-CM | POA: Diagnosis present

## 2017-08-18 DIAGNOSIS — I130 Hypertensive heart and chronic kidney disease with heart failure and stage 1 through stage 4 chronic kidney disease, or unspecified chronic kidney disease: Secondary | ICD-10-CM | POA: Diagnosis present

## 2017-08-18 DIAGNOSIS — Z960 Presence of urogenital implants: Secondary | ICD-10-CM | POA: Diagnosis not present

## 2017-08-18 DIAGNOSIS — D735 Infarction of spleen: Secondary | ICD-10-CM | POA: Diagnosis present

## 2017-08-18 DIAGNOSIS — D469 Myelodysplastic syndrome, unspecified: Secondary | ICD-10-CM | POA: Diagnosis not present

## 2017-08-18 DIAGNOSIS — N3289 Other specified disorders of bladder: Secondary | ICD-10-CM | POA: Diagnosis not present

## 2017-08-18 DIAGNOSIS — N136 Pyonephrosis: Principal | ICD-10-CM | POA: Diagnosis present

## 2017-08-18 DIAGNOSIS — Z9981 Dependence on supplemental oxygen: Secondary | ICD-10-CM

## 2017-08-18 DIAGNOSIS — E1122 Type 2 diabetes mellitus with diabetic chronic kidney disease: Secondary | ICD-10-CM | POA: Diagnosis not present

## 2017-08-18 DIAGNOSIS — I2510 Atherosclerotic heart disease of native coronary artery without angina pectoris: Secondary | ICD-10-CM | POA: Diagnosis present

## 2017-08-18 DIAGNOSIS — N183 Chronic kidney disease, stage 3 (moderate): Secondary | ICD-10-CM | POA: Diagnosis present

## 2017-08-18 DIAGNOSIS — I5032 Chronic diastolic (congestive) heart failure: Secondary | ICD-10-CM | POA: Diagnosis not present

## 2017-08-18 DIAGNOSIS — R339 Retention of urine, unspecified: Secondary | ICD-10-CM | POA: Diagnosis present

## 2017-08-18 DIAGNOSIS — E876 Hypokalemia: Secondary | ICD-10-CM | POA: Diagnosis present

## 2017-08-18 DIAGNOSIS — B952 Enterococcus as the cause of diseases classified elsewhere: Secondary | ICD-10-CM | POA: Diagnosis present

## 2017-08-18 DIAGNOSIS — R7881 Bacteremia: Secondary | ICD-10-CM | POA: Diagnosis not present

## 2017-08-18 DIAGNOSIS — Z9119 Patient's noncompliance with other medical treatment and regimen: Secondary | ICD-10-CM

## 2017-08-18 DIAGNOSIS — F209 Schizophrenia, unspecified: Secondary | ICD-10-CM | POA: Diagnosis present

## 2017-08-18 DIAGNOSIS — R100 Acute abdomen: Secondary | ICD-10-CM | POA: Diagnosis not present

## 2017-08-18 DIAGNOSIS — L538 Other specified erythematous conditions: Secondary | ICD-10-CM | POA: Diagnosis not present

## 2017-08-18 DIAGNOSIS — N312 Flaccid neuropathic bladder, not elsewhere classified: Secondary | ICD-10-CM | POA: Diagnosis not present

## 2017-08-18 DIAGNOSIS — J9611 Chronic respiratory failure with hypoxia: Secondary | ICD-10-CM | POA: Diagnosis not present

## 2017-08-18 DIAGNOSIS — R918 Other nonspecific abnormal finding of lung field: Secondary | ICD-10-CM | POA: Diagnosis not present

## 2017-08-18 DIAGNOSIS — R338 Other retention of urine: Secondary | ICD-10-CM | POA: Diagnosis not present

## 2017-08-18 DIAGNOSIS — I272 Pulmonary hypertension, unspecified: Secondary | ICD-10-CM | POA: Diagnosis present

## 2017-08-18 DIAGNOSIS — G9340 Encephalopathy, unspecified: Secondary | ICD-10-CM | POA: Diagnosis not present

## 2017-08-18 DIAGNOSIS — G4733 Obstructive sleep apnea (adult) (pediatric): Secondary | ICD-10-CM | POA: Diagnosis present

## 2017-08-18 DIAGNOSIS — R161 Splenomegaly, not elsewhere classified: Secondary | ICD-10-CM | POA: Diagnosis present

## 2017-08-18 DIAGNOSIS — I712 Thoracic aortic aneurysm, without rupture: Secondary | ICD-10-CM | POA: Diagnosis present

## 2017-08-18 DIAGNOSIS — I10 Essential (primary) hypertension: Secondary | ICD-10-CM | POA: Diagnosis present

## 2017-08-18 DIAGNOSIS — G9341 Metabolic encephalopathy: Secondary | ICD-10-CM | POA: Diagnosis present

## 2017-08-18 DIAGNOSIS — A498 Other bacterial infections of unspecified site: Secondary | ICD-10-CM

## 2017-08-18 DIAGNOSIS — F0390 Unspecified dementia without behavioral disturbance: Secondary | ICD-10-CM | POA: Diagnosis present

## 2017-08-18 DIAGNOSIS — Z2239 Carrier of other specified bacterial diseases: Secondary | ICD-10-CM | POA: Diagnosis not present

## 2017-08-18 DIAGNOSIS — B957 Other staphylococcus as the cause of diseases classified elsewhere: Secondary | ICD-10-CM | POA: Diagnosis not present

## 2017-08-18 DIAGNOSIS — E875 Hyperkalemia: Secondary | ICD-10-CM | POA: Diagnosis not present

## 2017-08-18 DIAGNOSIS — B9562 Methicillin resistant Staphylococcus aureus infection as the cause of diseases classified elsewhere: Secondary | ICD-10-CM | POA: Diagnosis not present

## 2017-08-18 DIAGNOSIS — Z87440 Personal history of urinary (tract) infections: Secondary | ICD-10-CM | POA: Diagnosis not present

## 2017-08-18 DIAGNOSIS — Z79899 Other long term (current) drug therapy: Secondary | ICD-10-CM

## 2017-08-18 DIAGNOSIS — N329 Bladder disorder, unspecified: Secondary | ICD-10-CM | POA: Diagnosis not present

## 2017-08-18 DIAGNOSIS — Z7982 Long term (current) use of aspirin: Secondary | ICD-10-CM

## 2017-08-18 DIAGNOSIS — R4182 Altered mental status, unspecified: Secondary | ICD-10-CM | POA: Diagnosis not present

## 2017-08-18 DIAGNOSIS — N320 Bladder-neck obstruction: Secondary | ICD-10-CM | POA: Diagnosis not present

## 2017-08-18 DIAGNOSIS — E119 Type 2 diabetes mellitus without complications: Secondary | ICD-10-CM

## 2017-08-18 HISTORY — DX: Infarction of spleen: D73.5

## 2017-08-18 HISTORY — DX: Chronic kidney disease, stage 3 unspecified: N18.30

## 2017-08-18 HISTORY — DX: Personal history of other diseases of urinary system: Z87.448

## 2017-08-18 HISTORY — DX: Pseudomonas (aeruginosa) (mallei) (pseudomallei) as the cause of diseases classified elsewhere: N39.0

## 2017-08-18 HISTORY — DX: Pseudomonas (aeruginosa) (mallei) (pseudomallei) as the cause of diseases classified elsewhere: B96.5

## 2017-08-18 HISTORY — DX: Pulmonary hypertension, unspecified: I27.20

## 2017-08-18 HISTORY — DX: Chronic kidney disease, stage 3 (moderate): N18.3

## 2017-08-18 HISTORY — DX: Chronic respiratory failure with hypoxia: J96.11

## 2017-08-18 LAB — CBC WITH DIFFERENTIAL/PLATELET
BAND NEUTROPHILS: 0 %
BASOS ABS: 0.5 10*3/uL — AB (ref 0.0–0.1)
BLASTS: 0 %
Basophils Relative: 4 %
EOS ABS: 0.3 10*3/uL (ref 0.0–0.7)
Eosinophils Relative: 2 %
HEMATOCRIT: 31.4 % — AB (ref 39.0–52.0)
HEMOGLOBIN: 9.7 g/dL — AB (ref 13.0–17.0)
Lymphocytes Relative: 19 %
Lymphs Abs: 2.6 10*3/uL (ref 0.7–4.0)
MCH: 26.9 pg (ref 26.0–34.0)
MCHC: 30.9 g/dL (ref 30.0–36.0)
MCV: 87.2 fL (ref 78.0–100.0)
METAMYELOCYTES PCT: 0 %
Monocytes Absolute: 1.5 10*3/uL — ABNORMAL HIGH (ref 0.1–1.0)
Monocytes Relative: 11 %
Myelocytes: 0 %
Neutro Abs: 8.6 10*3/uL — ABNORMAL HIGH (ref 1.7–7.7)
Neutrophils Relative %: 64 %
Other: 0 %
PROMYELOCYTES ABS: 0 %
Platelets: 314 10*3/uL (ref 150–400)
RBC: 3.6 MIL/uL — AB (ref 4.22–5.81)
RDW: 17.2 % — ABNORMAL HIGH (ref 11.5–15.5)
WBC: 13.5 10*3/uL — AB (ref 4.0–10.5)
nRBC: 0 /100 WBC

## 2017-08-18 LAB — URINALYSIS, ROUTINE W REFLEX MICROSCOPIC
BILIRUBIN URINE: NEGATIVE
Glucose, UA: NEGATIVE mg/dL
KETONES UR: NEGATIVE mg/dL
Nitrite: NEGATIVE
Protein, ur: >=300 mg/dL — AB
SPECIFIC GRAVITY, URINE: 1.011 (ref 1.005–1.030)
SQUAMOUS EPITHELIAL / LPF: NONE SEEN
pH: 6 (ref 5.0–8.0)

## 2017-08-18 LAB — COMPREHENSIVE METABOLIC PANEL
ALBUMIN: 2.8 g/dL — AB (ref 3.5–5.0)
ALK PHOS: 100 U/L (ref 38–126)
ALT: 14 U/L — AB (ref 17–63)
ANION GAP: 12 (ref 5–15)
AST: 23 U/L (ref 15–41)
BILIRUBIN TOTAL: 0.8 mg/dL (ref 0.3–1.2)
BUN: 48 mg/dL — AB (ref 6–20)
CALCIUM: 9.8 mg/dL (ref 8.9–10.3)
CO2: 19 mmol/L — AB (ref 22–32)
Chloride: 114 mmol/L — ABNORMAL HIGH (ref 101–111)
Creatinine, Ser: 1.51 mg/dL — ABNORMAL HIGH (ref 0.61–1.24)
GFR calc Af Amer: 50 mL/min — ABNORMAL LOW (ref >60)
GFR calc non Af Amer: 43 mL/min — ABNORMAL LOW (ref >60)
GLUCOSE: 89 mg/dL (ref 65–99)
Potassium: 4.5 mmol/L (ref 3.5–5.1)
Sodium: 145 mmol/L (ref 135–145)
Total Protein: 5.7 g/dL — ABNORMAL LOW (ref 6.5–8.1)

## 2017-08-18 LAB — HEMOGLOBIN A1C
HEMOGLOBIN A1C: 5.1 % (ref 4.8–5.6)
Mean Plasma Glucose: 99.67 mg/dL

## 2017-08-18 LAB — I-STAT CG4 LACTIC ACID, ED: Lactic Acid, Venous: 0.58 mmol/L (ref 0.5–1.9)

## 2017-08-18 LAB — CBG MONITORING, ED: GLUCOSE-CAPILLARY: 70 mg/dL (ref 65–99)

## 2017-08-18 MED ORDER — FERROUS GLUCONATE 324 (38 FE) MG PO TABS
324.0000 mg | ORAL_TABLET | Freq: Every day | ORAL | Status: DC
  Administered 2017-08-19 – 2017-08-24 (×6): 324 mg via ORAL
  Filled 2017-08-18 (×6): qty 1

## 2017-08-18 MED ORDER — B COMPLEX-C PO TABS
1.0000 | ORAL_TABLET | Freq: Every day | ORAL | Status: DC
  Administered 2017-08-19 – 2017-08-24 (×6): 1 via ORAL
  Filled 2017-08-18 (×6): qty 1

## 2017-08-18 MED ORDER — ONDANSETRON HCL 4 MG PO TABS: 4.0000 mg | ORAL_TABLET | Freq: Four times a day (QID) | ORAL | Status: DC | PRN

## 2017-08-18 MED ORDER — ACETAMINOPHEN 650 MG RE SUPP: 650.0000 mg | Freq: Four times a day (QID) | RECTAL | Status: DC | PRN

## 2017-08-18 MED ORDER — B COMPLEX PO TABS: 1.0000 | ORAL_TABLET | Freq: Every day | ORAL | Status: DC

## 2017-08-18 MED ORDER — ACETAMINOPHEN 325 MG PO TABS
650.0000 mg | ORAL_TABLET | Freq: Four times a day (QID) | ORAL | Status: DC | PRN
  Administered 2017-08-21: 650 mg via ORAL
  Filled 2017-08-18: qty 2

## 2017-08-18 MED ORDER — ENOXAPARIN SODIUM 40 MG/0.4ML ~~LOC~~ SOLN
40.0000 mg | SUBCUTANEOUS | Status: DC
  Administered 2017-08-18 – 2017-08-24 (×7): 40 mg via SUBCUTANEOUS
  Filled 2017-08-18 (×7): qty 0.4

## 2017-08-18 MED ORDER — SODIUM CHLORIDE 0.9 % IV SOLN
2.0000 g | Freq: Once | INTRAVENOUS | Status: AC
  Administered 2017-08-18: 2 g via INTRAVENOUS
  Filled 2017-08-18: qty 2

## 2017-08-18 MED ORDER — ONDANSETRON HCL 4 MG/2ML IJ SOLN: 4.0000 mg | Freq: Four times a day (QID) | INTRAMUSCULAR | Status: DC | PRN

## 2017-08-18 MED ORDER — SODIUM CHLORIDE 0.9 % IV BOLUS
1000.0000 mL | Freq: Once | INTRAVENOUS | Status: AC
Start: 2017-08-18 — End: 2017-08-18
  Administered 2017-08-18: 1000 mL via INTRAVENOUS

## 2017-08-18 MED ORDER — DARBEPOETIN ALFA 40 MCG/0.4ML IJ SOSY
40.0000 ug | PREFILLED_SYRINGE | INTRAMUSCULAR | Status: DC
Start: 2017-08-18 — End: 2017-08-20
  Filled 2017-08-18: qty 0.4

## 2017-08-18 MED ORDER — ASPIRIN EC 81 MG PO TBEC
81.0000 mg | DELAYED_RELEASE_TABLET | Freq: Every day | ORAL | Status: DC
  Administered 2017-08-18 – 2017-08-24 (×7): 81 mg via ORAL
  Filled 2017-08-18 (×7): qty 1

## 2017-08-18 MED ORDER — ENSURE ENLIVE PO LIQD
237.0000 mL | Freq: Two times a day (BID) | ORAL | Status: DC
  Administered 2017-08-18 – 2017-08-24 (×11): 237 mL via ORAL
  Filled 2017-08-18 (×2): qty 237

## 2017-08-18 MED ORDER — TAMSULOSIN HCL 0.4 MG PO CAPS
0.4000 mg | ORAL_CAPSULE | Freq: Every day | ORAL | Status: DC
  Administered 2017-08-18 – 2017-08-23 (×6): 0.4 mg via ORAL
  Filled 2017-08-18 (×6): qty 1

## 2017-08-18 MED ORDER — MEROPENEM 1 G IV SOLR
1.0000 g | Freq: Two times a day (BID) | INTRAVENOUS | Status: DC
  Administered 2017-08-18 – 2017-08-19 (×2): 1 g via INTRAVENOUS
  Filled 2017-08-18 (×4): qty 1

## 2017-08-18 MED ORDER — SODIUM CHLORIDE 0.9 % IV SOLN: 1.0000 g | Freq: Two times a day (BID) | INTRAVENOUS | Status: DC

## 2017-08-18 MED ORDER — HYDRALAZINE HCL 25 MG PO TABS
25.0000 mg | ORAL_TABLET | Freq: Two times a day (BID) | ORAL | Status: DC
  Administered 2017-08-18 – 2017-08-24 (×13): 25 mg via ORAL
  Filled 2017-08-18 (×13): qty 1

## 2017-08-18 NOTE — ED Triage Notes (Signed)
Per Ems pt from home altered mental status with bladder pain, smells or urine, foley catheter, just discharged on Monday for UTI, non ambulatory at baseline, always on 3L nasal cannula

## 2017-08-18 NOTE — ED Provider Notes (Signed)
[facility] EMERGENCY DEPARTMENT Provider Note   CSN: [id number] Arrival date & time: 08/18/17  0141     History   Chief Complaint Chief Complaint  Patient presents with  . Urinary Tract Infection    HPI Juan [name] is a 77 y.o. male.  Level 5 caveat for altered mental status.  Patient brought in by EMS with concern for urinary tract infection.  He was notably discharged from the hospital 2 days ago with indwelling Foley.  He completed 14-day course of IV antibiotics for multidrug-resistant Pseudomonas.  He returns with confusion, lower abdominal pain, foul-smelling urine and nausea.  No documented fevers.  EMS reports very poor conditions in the house.  Patient was found lying in feces and urine.  Feces is coating his Foley catheter.  Patient states he lives with his son but is clearly not able to take care of himself.  He denies any falls.  He complains of chills but no documented fever.  He was not discharge in the antibiotics.  PICC line was recommended during previous admission the patient refused this because "the Lord told him not to get one."  And psychiatry said he did not have capacity to make such a decision  The history is provided by the patient.  Urinary Tract Infection   Associated symptoms include chills, nausea and flank pain. Pertinent negatives include no vomiting.    Past Medical History:  Diagnosis Date  . Allergic rhinitis   . Anemia   . Ascending aortic aneurysm ([place]) 03/2014   4.3cm on CT scan  . CAD (coronary artery disease)    dx elsewheer in past, no documentation. Non-ischemic myoview 2007  . Chronic diastolic CHF (congestive heart failure), NYHA class 2 (HCC)    Normal EF w/ grade 1 dd by echo 12/2015  . Edema    R>L leg, u/s 5-12 neg for DVT  . Hemorrhoid   . History of thrombocytosis   . Hypertension   . Iron deficiency anemia due to chronic blood loss 03/31/2017  . 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 Migraine    "once/wk at  least" (07/11/2013)  . Myeloproliferative disease ([place])   . On home oxygen therapy    "2L; all the time" (05/03/2017)  . Pyelonephritis 04/29/2017  . Shortness of breath   . Sinus congestion   . Sleep apnea, obstructive    at some point used CPAP, was d/c  years ago  . Type II diabetes mellitus ([place])   . UTI (urinary tract infection) 06/2017  . UTI (urinary tract infection) 07/2017    Patient Active Problem List   Diagnosis Date Noted  . Evaluation by psychiatric service required   . Pyelonephritis 07/17/2017  . Recurrent UTI 07/16/2017  . Acute exacerbation of CHF (congestive heart failure) ([place]) 07/06/2017  . UTI (urinary tract infection) 07/06/2017  . Acute GI bleeding 06/07/2017  . Hypokalemia   . Iron deficiency anemia due to chronic blood loss 03/31/2017  . 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 Urinary retention 01/24/2017  . 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 Elevated troponin 11/14/2016  . Recurrent pneumonia 10/31/2016  . Pulmonary hypertension severe   . Sprain of left rotator cuff capsule   . Hyperkalemia   . Paroxysmal SVT (supraventricular tachycardia) ([place]) 04/20/2016  . Type II diabetes mellitus ([place]) 04/20/2016  . Gout 04/20/2016  . B12 deficiency 04/20/2016  . 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 Hydronephrosis   . Thrombocytosis ([place]) 04/07/2016  .  Pressure ulcer stage II 12/31/2015  . T wave inversion in EKG 12/29/2015  . PCP NOTES >>>>>>>>>>>>>>>>> 05/02/2015  . Peripheral edema   . Thoracic aortic aneurysm ([place]) 04/26/2014  . CKD (chronic kidney disease) stage 3, GFR 30-59 ml/min (HCC) 04/26/2014  . Generalized weakness 04/06/2014  . Venous stasis dermatitis of both lower extremities   . Morbid obesity due to excess calories ([place]) 03/29/2014  . 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 Poor compliance with advise  05/05/2012  . Annual physical exam 01/14/2012  . Weight loss 01/14/2012  . BPH  (benign prostatic hyperplasia) 01/14/2012  . Myeloproliferative disease ([place]) 09/07/2006  . Obstructive sleep apnea 09/07/2006  . 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 Coronary atherosclerosis 09/07/2006  . Chronic diastolic CHF (congestive heart failure) ([place]) 09/07/2006  . Allergic rhinitis 09/07/2006    Past Surgical History:  Procedure Laterality Date  . FLEXIBLE SIGMOIDOSCOPY N/A 06/08/2017   Procedure: FLEXIBLE SIGMOIDOSCOPY;  Surgeon: Carol Ada, MD;  Location: [facility];  Service: Endoscopy;  Laterality: N/A;  . IR FLUORO GUIDE CV LINE RIGHT  05/07/2017  . IR FLUORO RM 30-60 MIN  05/07/2017  . IR US GUIDE VASC ACCESS RIGHT  05/07/2017  . TOE SURGERY Right    "tried to straighten out big toe" (07/11/2013)        Home Medications    Prior to Admission medications   Medication Sig Start Date End Date Taking? Authorizing Provider  acetaminophen (TYLENOL) 325 MG tablet Take 2 tablets (650 mg total) by mouth every 6 (six) hours as needed for mild pain (or Fever >/= 101). 05/08/17   Rai, Vernelle Emerald, MD  aspirin EC 81 MG tablet Take 81 mg by mouth daily.    [provider]  b complex vitamins tablet Take 1 tablet by mouth daily.    [provider]  benzonatate (TESSALON) 100 MG capsule Take 1 capsule (100 mg total) by mouth 2 (two) times daily as needed for cough. 07/22/17   Hongalgi, Lenis Dickinson, MD  ferrous gluconate (FERGON) 240 (27 FE) MG tablet Take 240 mg by mouth daily.    [provider]  furosemide (LASIX) 40 MG tablet Take 80mg  (2tabs) in morning and 40mg  (1tab) in evening 07/10/17   Domenic Polite, MD  hydrALAZINE (APRESOLINE) 25 MG tablet Take 25 mg by mouth 2 (two) times daily.    [provider]  OXYGEN Inhale 2 L into the lungs continuous.    [provider]  tamsulosin (FLOMAX) 0.4 MG CAPS capsule Take 1 capsule (0.4 mg total) by mouth daily after supper. 06/13/17    Bonnielee Haff, MD    Family History Family History  Problem Relation Age of Onset  . Schizophrenia Son   . Mental illness Daughter   . Colon cancer Neg Hx   . Prostate cancer Neg Hx   . Heart attack Neg Hx   . Diabetes Neg Hx     Social History Social History   Tobacco Use  . Smoking status: Former Smoker    Packs/day: 0.25    Years: 12.00    Pack years: 3.00    Types: Cigarettes    Last attempt to quit: 05/18/1966    Years since quitting: 51.2  . Smokeless tobacco: Never Used  . Tobacco comment: quit smoking 45 years ago  Substance Use Topics  . Alcohol use: No    Alcohol/week: 0.0 oz  . Drug use: No  Allergies   Patient has no known allergies.   Review of Systems Review of Systems  Constitutional: Positive for activity change, appetite change and chills. Negative for fever.  HENT: Negative for congestion and nosebleeds.   Gastrointestinal: Positive for abdominal pain and nausea. Negative for diarrhea and vomiting.  Genitourinary: Positive for flank pain.  Musculoskeletal: Positive for arthralgias, back pain and myalgias.  Skin: Negative for rash.  Neurological: Positive for weakness. Negative for dizziness and headaches.   all other systems are negative except as noted in the HPI and PMH.     Physical Exam Updated Vital Signs Temp 98.4 F (36.9 C) (Rectal)   Ht 6\' 3"  (1.905 m)   Wt 80.7 kg (178 lb)   BMI 22.25 kg/m   Physical Exam  Constitutional: He is oriented to person, place, and time. He appears well-developed and well-nourished. No distress.  Chronically ill-appearing, shaking chills  HENT:  Head: Normocephalic and atraumatic.  Mouth/Throat: Oropharynx is clear and moist. No oropharyngeal exudate.  Eyes: Pupils are equal, round, and reactive to light. Conjunctivae and EOM are normal.  Neck: Normal range of motion. Neck supple.  No meningismus.  Cardiovascular: Normal rate, regular rhythm, normal heart sounds and intact distal pulses.    No murmur heard. Pulmonary/Chest: Effort normal and breath sounds normal. No respiratory distress.  Abdominal: Soft. There is tenderness. There is no rebound and no guarding.  Suprapubic tenderness without guarding or rebound  Genitourinary:  Genitourinary Comments: Contaminated Foley catheter in place Urine in drainage bag.  Hypospadias present with small amount of drainage from penis  Musculoskeletal: Normal range of motion. He exhibits no edema or tenderness.  Neurological: He is alert and oriented to person, place, and time. No cranial nerve deficit. He exhibits normal muscle tone. Coordination normal.  No ataxia on finger to nose bilaterally. No pronator drift. 5/5 strength throughout. CN 2-12 intact.Equal grip strength. Sensation intact.   Skin: Skin is warm.  Psychiatric: He has a normal mood and affect. His behavior is normal.  Nursing note and vitals reviewed.    ED Treatments / Results  Labs (all labs ordered are listed, but only abnormal results are displayed) Labs Reviewed  CBC WITH DIFFERENTIAL/PLATELET - Abnormal; Notable for the following components:      Result Value   WBC 13.5 (*)    RBC 3.60 (*)    Hemoglobin 9.7 (*)    HCT 31.4 (*)    RDW 17.2 (*)    Neutro Abs 8.6 (*)    Monocytes Absolute 1.5 (*)    Basophils Absolute 0.5 (*)    All other components within normal limits  COMPREHENSIVE METABOLIC PANEL - Abnormal; Notable for the following components:   Chloride 114 (*)    CO2 19 (*)    BUN 48 (*)    Creatinine, Ser 1.51 (*)    Total Protein 5.7 (*)    Albumin 2.8 (*)    ALT 14 (*)    GFR calc non Af Amer 43 (*)    GFR calc Af Amer 50 (*)    All other components within normal limits  URINALYSIS, ROUTINE W REFLEX MICROSCOPIC - Abnormal; Notable for the following components:   Hgb urine dipstick SMALL (*)    Protein, ur >=300 (*)    Leukocytes, UA TRACE (*)    Bacteria, UA RARE (*)    All other components within normal limits  CULTURE, BLOOD (ROUTINE  X 2)  CULTURE, BLOOD (ROUTINE X 2)  URINE CULTURE  I-STAT CG4 LACTIC ACID, ED  I-STAT CG4 LACTIC ACID, ED    EKG EKG Interpretation  Date/Time:  Wednesday August 18 2017 05:39:19 EDT Ventricular Rate:  47 PR Interval:    QRS Duration: 98 QT Interval:  468 QTC Calculation: 414 R Axis:   -4 Text Interpretation:  Sinus bradycardia Probable left ventricular hypertrophy No significant change was found Confirmed by Ezequiel Essex [phone]) on 08/18/2017 6:08:42 AM   Radiology Dg Chest Portable 1 View  Result Date: 08/18/2017 CLINICAL DATA:  Altered mental status EXAM: PORTABLE CHEST 1 VIEW COMPARISON:  07/16/2017, 07/06/2017, 06/12/2017, 01/23/2017 FINDINGS: Continued bilateral left greater than right ground-glass opacity. Mild cardiomegaly. Aortic atherosclerosis. No pneumothorax. Abnormal osseous structures with diffuse sclerosis and multiple small lucent lesions. IMPRESSION: 1. Similar appearance of mild interstitial and ground-glass opacity within the bilateral lungs. Stable enlarged cardiomediastinal silhouette. 2. No significant interval change compared to prior radiograph. Electronically Signed   By: Donavan Foil M.D.   On: 08/18/2017 03:11    Procedures Procedures (including critical care time)  Medications Ordered in ED Medications  sodium chloride 0.9 % bolus 1,000 mL (has no administration in time range)     Initial Impression / Assessment and Plan / ED Course  I have reviewed the triage vital signs and the nursing notes.  Pertinent labs & imaging results that were available during my care of the patient were reviewed by me and considered in my medical decision making (see chart for details).    Patient presents with altered mental status, confusion, bladder pain in setting of chronic Foley catheter.  Discharge from hospital 2 days ago after 2-week stay for UTI with multidrug-resistant Pseudomonas.  IV access established.  Patient's vitals are stable with normal lactate.   Sepsis blood work since.  Urinalysis shows blood without evidence of obvious infection.  Patient just completed 14 days of cefepime.  Contaminated Foley catheter was exchanged for new catheter. Labs show AK I with mild leukocytosis.  Urinalysis is not convincing for infection.  Culture sent.  CT scan shows persistent bladder wall thickening  Patient started back on cefepime given altered mental status and previous multidrug-resistant UTI. Marland Kitchen  Seems to be confused with shaking chills but no fever.  Does not appear to be toxic or septic.  Unclear baseline mental status. Given his ongoing confusion and recent hospitalization plan admit back to hospital for IV fluids and antibiotics while cultures are pending.  Foley catheter has been exchanged. Admission discussed with Dr. Hal Hope.   Angiocath insertion Performed by: Ezequiel Essex  Consent: Verbal consent obtained. Risks and benefits: risks, benefits and alternatives were discussed Time out: Immediately prior to procedure a "time out" was called to verify the correct patient, procedure, equipment, support staff and site/side marked as required.  Preparation: Patient was prepped and draped in the usual sterile fashion.  Vein Location: L IJ  Ultrasound Guided  Gauge: 18  Normal blood return and flush without difficulty Patient tolerance: Patient tolerated the procedure well with no immediate complications.     Final Clinical Impressions(s) / ED Diagnoses   Final diagnoses:  Acute encephalopathy  Complicated UTI (urinary tract infection)    ED Discharge Orders    None       Arnika Larzelere, Annie Main, MD 08/18/17 [phone]

## 2017-08-18 NOTE — ED Notes (Signed)
Catheter care performed by Velna Hatchet, RN

## 2017-08-18 NOTE — ED Notes (Signed)
Lab notified of HGB A1C order.

## 2017-08-18 NOTE — ED Notes (Signed)
Pt refusing to go for CT scan until he "gets something to eat." Pt further states that he "doesn't want a [place] sandwich and there are certain foods he cannot eat."

## 2017-08-18 NOTE — ED Notes (Signed)
Patient transported to CT 

## 2017-08-18 NOTE — ED Notes (Signed)
Meal Tray delivered.

## 2017-08-18 NOTE — Consult Note (Signed)
Urology Consult  Consulting MD: Rancour  CC: Recurrent urinary tract infection, urinary retention   HPI: This is a 77year old male presenting again for admission to the hospital with possible urinary tract infection.  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  The patient does have a history of bilateral hydronephrosis, which resolved with catheter management.  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  He was discharged from the hospital 2 days ago with a Foley catheter in place.  The patient was readmitted today because of altered mental status.  EMS was contacted and called to the patient's home where he was found lying in feces.  He is brought to the emergency room for further management.  Urologic consultation is requested to steer the patient in the right direction as to long-term management of his inadequate bladder drainage as well as history of hydronephrosis and urinary tract infections. PMH: Past Medical History:  Diagnosis Date  . Allergic rhinitis   . Anemia   . Ascending aortic aneurysm ([place]) 03/2014   4.3cm on CT scan  . CAD (coronary artery disease)    dx elsewheer in past, no documentation. Non-ischemic myoview 2007  . Chronic diastolic CHF (congestive heart failure), NYHA class 2 (HCC)    Normal EF w/ grade 1 dd by echo 12/2015  . Chronic respiratory failure with hypoxia/2L at home    "2L; all the time" (05/03/2017)  . CKD (chronic kidney disease), stage III ([place])   . Edema    R>L leg, u/s 5-12 neg for DVT  . Hemorrhoid   . History of hydronephrosis   . History of Splenic infarct   . History of thrombocytosis   . Hypertension   . Iron deficiency anemia due to chronic blood loss 03/31/2017  . 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 Migraine    "once/wk at least" (07/11/2013)  . Moderate to severe pulmonary hypertension ([place])   . Myeloproliferative  disease ([place])   . Pyelonephritis 04/29/2017  . Recurrent Pseudomonas urinary tract infection   . Shortness of breath   . Sinus congestion   . Sleep apnea, obstructive    at some point used CPAP, was d/c  years ago  . Type II diabetes mellitus ([place])   . UTI (urinary tract infection) 06/2017  . UTI (urinary tract infection) 07/2017    PSH: Past Surgical History:  Procedure Laterality Date  . FLEXIBLE SIGMOIDOSCOPY N/A 06/08/2017   Procedure: FLEXIBLE SIGMOIDOSCOPY;  Surgeon: Carol Ada, MD;  Location: [facility];  Service: Endoscopy;  Laterality: N/A;  . IR FLUORO GUIDE CV LINE RIGHT  05/07/2017  . IR FLUORO RM 30-60 MIN  05/07/2017  . IR US GUIDE VASC ACCESS RIGHT  05/07/2017  . TOE SURGERY Right    "tried to straighten out big toe" (07/11/2013)    Allergies: No Known Allergies  Medications:  (Not in a hospital admission)   Social History: Social History   Socioeconomic History  . Marital status: Widowed    Spouse name: Not on file  . Number of children: 2  . Years of education: Not on file  . Highest education level: Not on file  Occupational History  . Occupation: retired     Fish farm manager: RETIRED  Social Needs  . Financial resource strain: Not on file  . Food insecurity:    Worry: Not on file    Inability: Not on file  . Transportation needs:  Medical: Not on file    Non-medical: Not on file  Tobacco Use  . Smoking status: Former Smoker    Packs/day: 0.25    Years: 12.00    Pack years: 3.00    Types: Cigarettes    Last attempt to quit: 05/18/1966    Years since quitting: 51.2  . Smokeless tobacco: Never Used  . Tobacco comment: quit smoking 45 years ago  Substance and Sexual Activity  . Alcohol use: No    Alcohol/week: 0.0 oz  . Drug use: No  . Sexual activity: Yes  Lifestyle  . Physical activity:    Days per week: Not on file    Minutes per session: Not on file  . Stress: Not on file  Relationships  . Social connections:    Talks on phone:  Not on file    Gets together: Not on file    Attends religious service: Not on file    Active member of club or organization: Not on file    Attends meetings of clubs or organizations: Not on file    Relationship status: Not on file  . Intimate partner violence:    Fear of current or ex partner: Not on file    Emotionally abused: Not on file    Physically abused: Not on file    Forced sexual activity: Not on file  Other Topics Concern  . Not on file  Social History Narrative   Moved from [place] 2006   Widow 2007   Son lives with him       Admitted to [facility]08[facility]17   Former smoker-stopped 1968   Alcohol none   Full Code    Family History: Family History  Problem Relation Age of Onset  . Schizophrenia Son   . Mental illness Daughter   . Colon cancer Neg Hx   . Prostate cancer Neg Hx   . Heart attack Neg Hx   . Diabetes Neg Hx     Review of Systems: Positive: Weakness, inability to urinate, confusion Negative:   A further 10 point review of systems was negative except what is listed in the HPI.  Physical Exam: @VITALS2 @ General: No acute distress.  Awake.  Patient is conversive, appropriate Head:  Normocephalic.  Atraumatic. ENT:  EOMI.  Mucous membranes dry Neck:  Supple.  No lymphadenopathy. CV:  Regular rate. Pulmonary: Equal effort bilaterally.  Abdomen: Soft.  Non-tender to palpation. Skin:  Normal turgor.  No visible rash. Extremity: No gross deformity of bilateral upper extremities.  No gross deformity of    bilateral lower extremities. Neurologic: Alert. Appropriate mood. Rectal:  Normal anal sphincter tone.  Gland small, nonnodular, nontender Penis:  Uncircumcised.  No lesions. Urethra: Foley catheter in place.  Hypospadiac meatus, from catheter placement Scrotum: No lesions.  No ecchymosis.  No erythema. Testicles: Descended bilaterally.  No masses bilaterally.  Atrophic Epididymis: Palpable bilaterally.  Non Tender to  palpation.  Studies:  Recent Labs    08/18/17 0247  HGB 9.7*  WBC 13.5*  PLT 314    Recent Labs    08/18/17 0247  NA 145  K 4.5  CL 114*  CO2 19*  BUN 48*  CREATININE 1.51*  CALCIUM 9.8  GFRNONAA 43*  GFRAA 50*     No results for input(s): INR, APTT in the last 72 hours.  Invalid input(s): PT   Invalid input(s): ABG    Assessment: 1.  Urinary retention.  This may be due to  atonic bladder versus bladder outlet obstruction/BPH.  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  The patient is debilitated, and at this point I do not think he is a candidate for a voiding trial because of his bedbound status.  Long-term, it would be optimal for the patient to be strong enough to walk, and manages toileting normally.  However, I do not think in the short-term that this is possible.  Thus, currently I think the patient will need his urethral catheter.  Eventually, if he does have improvement in his physical status, urodynamics, cystoscopy could be performed to better evaluate his situation.  This will not be done at the present time, but once he has recovered adequately, we can do this in the office  2.  Urinary tract infection, recurrent.  This was treated adequately with IV antibiotics at his last hospitalization, which was until 2 days ago.  I am not certain that he has a recurrent infection at this time, but certainly urine cultures and adequate empiric therapy at this time would be worthwhile.  Eventually, if he has a catheter long-term and recurrent infections, and is in a nursing facility, bladder irrigations with acetic acid, 0.25% a couple of times a day may decrease the frequency of his urinary tract infections  3.  Hydronephrosis, most likely to incomplete bladder emptying.  This has resolved with Foley catheter placement.  Plan: 1.  For now, the catheter will have to stay  2.   Commence with vigorous physical therapy to improve his overall physical status.  If he improves enough, certainly a voiding trial would be worthwhile eventually, but basically not until he has other evaluation of his bladder outlet/bladder function  3.  We will follow with you    [phone]

## 2017-08-18 NOTE — Progress Notes (Signed)
Pharmacy Antibiotic Note  Juan [name] is a 77 y.o. male admitted on 08/18/2017 with UTI.  Pharmacy has been consulted for Cefepime dosing. Pt was just recently treated for a pseudomonas UTI (R-Cipro, S-all others). Back from home with altered mental status. WBC 13.5. Mild AKI.   Plan: Cefepime 1g IV q12h, inc to q8h if renal function improves Trend WBC, temp F/U urine culture  Height: 6\' 3"  (190.5 cm) Weight: 178 lb (80.7 kg) IBW/kg (Calculated) : 84.5  Temp (24hrs), Avg:98.4 F (36.9 C), Min:98.4 F (36.9 C), Max:98.4 F (36.9 C)  Recent Labs  Lab 08/18/17 0212 08/18/17 0247  WBC  --  13.5*  CREATININE  --  1.51*  LATICACIDVEN 0.58  --     Estimated Creatinine Clearance: 48.2 mL/min (A) (by C-G formula based on SCr of 1.51 mg/dL (H)).    No Known Allergies   Juan [name], Juan [name] 08/18/2017 6:00 AM

## 2017-08-18 NOTE — ED Notes (Signed)
Admitting paged to RN per their request

## 2017-08-18 NOTE — ED Notes (Signed)
Lunch tray ordered by this RN.

## 2017-08-18 NOTE — ED Notes (Signed)
Pt in room E 40. Report received from [place], [place]

## 2017-08-18 NOTE — Progress Notes (Signed)
Pharmacy Antibiotic Note  Juan [name] is a 77 y.o. male admitted on 08/18/2017 with UTI.  Pharmacy has been consulted for Meropenem dosing. Pt was just recently treated for a pseudomonas UTI (R-Cipro, S-all others) and has been treated 3 times with cefepime per MD. Back from home with altered mental status. WBC 13.5. Mild AKI (BL 1-1.1)   Plan: Meropenem 1g IV q12h, inc to q8h if renal function improves Trend WBC, temp F/U urine culture  Height: 6\' 3"  (190.5 cm) Weight: 178 lb (80.7 kg) IBW/kg (Calculated) : 84.5  Temp (24hrs), Avg:98.4 F (36.9 C), Min:98.4 F (36.9 C), Max:98.4 F (36.9 C)  Recent Labs  Lab 08/18/17 0212 08/18/17 0247  WBC  --  13.5*  CREATININE  --  1.51*  LATICACIDVEN 0.58  --     Estimated Creatinine Clearance: 48.2 mL/min (A) (by C-G formula based on SCr of 1.51 mg/dL (H)).    No Known Allergies   Theotis Burrow, PharmD BCPS Clinical Pharmacist 08/18/2017 8:04 AM

## 2017-08-18 NOTE — ED Notes (Signed)
Pt reports he is a diabetic and gets his "suger checked". Ordered.

## 2017-08-18 NOTE — Progress Notes (Signed)
Patient arrived to unit via stretcher from ER. Skin assessment performed. Pink area above sacrum. Patient has no complaints of pain. Set up for dinner at patient's request. Bed alarm on, call light within patient reach. Juan [name]

## 2017-08-18 NOTE — ED Notes (Signed)
Checked patient cbg it was 38 notified RN of blood sugar patient is resting with call Schroeter in reach

## 2017-08-18 NOTE — Discharge Planning (Signed)
Pt was recently active with [name] for home health services  Spoke with [facility] who states pt will be discharged from [facility]

## 2017-08-18 NOTE — H&P (Addendum)
History and Physical    Juan [name]:[id number] DOB: 1941-02-20 DOA: 08/18/2017  **Will admit patient based on the expectation that the patient will need hospitalization/ hospital care that crosses at least 2 midnights  PCP: Colon Branch, MD   Attending physician: Lorin Mercy  Patient coming from/Resides with: Private residence/son who has schizophrenia  Chief Complaint: Altered mental status  HPI: Juan [name] is a 77 y.o. 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  Patient has been admitted 6 times since November 2018 with issues either related to recurrent pseudomonal UTI and one hospitalization was secondary to GI bleeding in January 2019.  Patient reportedly has a urologist in [facility] but has never followed up during this timeframe.  He was most recently discharged on 4/1 after another admission for UTI and hydroureter.  Foley catheter was placed.  Patient was treated with cefepime for 14 days.  Hospitalization was protracted so patient could receive IV antibiotics since he refused a PICC line.  Because of this psychiatry was consulted and deemed the patient lacked capacity to make appropriate medical decisions.  Patient lives with schizophrenic son who he describes as only being able to heat up food that has been previously prepared and sometimes performing laundry duties.  Patient has daughter who he describes as "working 2 jobs" and therefore she is unable to assist with care other than apparently bringing meals by per patient report.   He has been placed in SNF back in November for apparent rehab and now refuses to return to SNF because of need to perform PT/OT yet states he wants to improve and go home.    EMS was called to the patient's home because of altered mentation.  Patient apparently was complaining of lower abdominal/bladder pain.  He was noted to be covered in feces (coating the Foley catheter) and smelling of urine.  Patient was normotensive and afebrile upon presentation but bradycardic with heart rates between 45 and 57 bpm.  He has been given a liter of IV fluids and given a dose of cefepime IV.  WBC of 13,500 without left shift, albumin 2.8, urinalysis not consistent with UTI.  Blood cultures and urine cultures obtained prior to administration of antibiotics.  ED Course:  Vital Signs: BP (!) 175/58   Pulse (!) 53   Temp 98.4 F (36.9 C) (Rectal)   Resp 16   Ht '6\' 3"'$  (1.905 m)   Wt 80.7 kg (178 lb)   SpO2 100%   BMI 22.25 kg/m  CXR: Stable interstitial and groundglass opacities in the bilateral lungs. CT renal stone study: Diffuse bladder wall thickening likely indicating cystitis.  Large bladder diverticula.  No hydronephrosis or hydroureter.  No obstructing stones.  Diffuse edema throughout the subcutaneous and mesenteric fat without loculated fluid collections. Pertinent lab data: Potassium 4.5, chloride 114, CO2 19, BUN 48, creatinine 1.51 8, lactic acid 0.58, white count 13,500 with neutrophils 64% neutrophils 8.6%, hemoglobin 9.7, platelets 314,000, urinalysis with small amount of hemoglobin, trace leukocytes, protein greater than 300, rare bacteria, blood cultures and urine culture obtained in the ER Medications and treatments: Normal saline bolus and antibiotic  as above  Review of Systems:  In addition to the HPI above,  No Fever, reports chills, no myalgias or other constitutional symptoms No Headache, changes with Vision or hearing, new weakness, tingling, numbness in any extremity, dizziness,  dysarthria or word finding difficulty, gait disturbance or imbalance, tremors or seizure activity No problems swallowing food or Liquids, indigestion/reflux, choking or coughing while eating, abdominal pain with or after eating No Chest pain, Cough or Shortness of Breath, palpitations, orthopnea or DOE No Abdominal pain, N/V, melena,hematochezia, dark tarry stools, constipation No dysuria, malodorous urine, hematuria or flank pain No new skin rashes, lesions, masses or bruises, No new joint pains, aches, swelling or redness No recent unintentional weight gain or loss No polyuria, polydypsia or polyphagia **Of note patient likely inconsistent historian given lack of capacity and apparent undiagnosed underlying dementia diagnosis   Past Medical History:  Diagnosis Date  . Allergic rhinitis   . Anemia   . Ascending aortic aneurysm ([place]) 03/2014   4.3cm on CT scan  . CAD (coronary artery disease)    dx elsewheer in past, no documentation. Non-ischemic myoview 2007  . Chronic diastolic CHF (congestive heart failure), NYHA class 2 (HCC)    Normal EF w/ grade 1 dd by echo 12/2015  . Chronic respiratory failure with hypoxia/2L at home    "2L; all the time" (05/03/2017)  . CKD (chronic kidney disease), stage III ([place])   . Edema    R>L leg, u/s 5-12 neg for DVT  . Hemorrhoid   . History of hydronephrosis   . History of Splenic infarct   . History of thrombocytosis   . Hypertension   . Iron deficiency anemia due to chronic blood loss 03/31/2017  . 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 Migraine    "once/wk at least" (07/11/2013)  . Moderate to severe pulmonary hypertension ([place])   . Myeloproliferative disease ([place])   . Pyelonephritis 04/29/2017  . Recurrent Pseudomonas urinary tract infection   . Shortness of breath   . Sinus congestion   . Sleep apnea, obstructive    at some point used CPAP, was d/c  years ago  . Type II diabetes mellitus ([place])   . UTI (urinary tract infection) 06/2017    . UTI (urinary tract infection) 07/2017    Past Surgical History:  Procedure Laterality Date  . FLEXIBLE SIGMOIDOSCOPY N/A 06/08/2017   Procedure: FLEXIBLE SIGMOIDOSCOPY;  Surgeon: Carol Ada, MD;  Location: [facility];  Service: Endoscopy;  Laterality: N/A;  . IR FLUORO GUIDE CV LINE RIGHT  05/07/2017  . IR FLUORO RM 30-60 MIN  05/07/2017  . IR US GUIDE VASC ACCESS RIGHT  05/07/2017  . TOE SURGERY Right    "tried to straighten out big toe" (07/11/2013)    Social History   Socioeconomic History  . Marital status: Widowed    Spouse name: Not on file  . Number of children: 2  . Years of education: Not on file  . Highest education level: Not on file  Occupational History  . Occupation: retired     Fish farm manager: RETIRED  Social Needs  . Financial resource strain: Not on file  . Food insecurity:    Worry: Not on file    Inability: Not on file  . Transportation needs:    Medical: Not on file    Non-medical: Not on file  Tobacco Use  . Smoking status: Former Smoker    Packs/day: 0.25    Years: 12.00    Pack years: 3.00  Types: Cigarettes    Last attempt to quit: 05/18/1966    Years since quitting: 51.2  . Smokeless tobacco: Never Used  . Tobacco comment: quit smoking 45 years ago  Substance and Sexual Activity  . Alcohol use: No    Alcohol/week: 0.0 oz  . Drug use: No  . Sexual activity: Yes  Lifestyle  . Physical activity:    Days per week: Not on file    Minutes per session: Not on file  . Stress: Not on file  Relationships  . Social connections:    Talks on phone: Not on file    Gets together: Not on file    Attends religious service: Not on file    Active member of club or organization: Not on file    Attends meetings of clubs or organizations: Not on file    Relationship status: Not on file  . Intimate partner violence:    Fear of current or ex partner: Not on file    Emotionally abused: Not on file    Physically abused: Not on file    Forced sexual  activity: Not on file  Other Topics Concern  . Not on file  Social History Narrative   Moved from [place] 2006   Widow 2007   Son lives with him       Admitted to [facility] 05/08/17   Former smoker-stopped 1968   Alcohol none   Full Code    Mobility: Nonambulatory for at least 6 months according to patient report.  Has previously been referred for home health PT as well as SNF and has refused placement.  Patient reports being frustrated with previous SNF PT/OT treatments and willingness to participate yet states he wants to "get better and go home". Work history: Not obtained   No Known Allergies  Family History  Problem Relation Age of Onset  . Schizophrenia Son   . Mental illness Daughter   . Colon cancer Neg Hx   . Prostate cancer Neg Hx   . Heart attack Neg Hx   . Diabetes Neg Hx      Prior to Admission medications   Medication Sig Start Date End Date Taking? Authorizing Provider  acetaminophen (TYLENOL) 325 MG tablet Take 2 tablets (650 mg total) by mouth every 6 (six) hours as needed for mild pain (or Fever >/= 101). 05/08/17   Rai, Vernelle Emerald, MD  aspirin EC 81 MG tablet Take 81 mg by mouth daily.    [provider]  b complex vitamins tablet Take 1 tablet by mouth daily.    [provider]  benzonatate (TESSALON) 100 MG capsule Take 1 capsule (100 mg total) by mouth 2 (two) times daily as needed for cough. 07/22/17   Hongalgi, Lenis Dickinson, MD  ferrous gluconate (FERGON) 240 (27 FE) MG tablet Take 240 mg by mouth daily.    [provider]  furosemide (LASIX) 40 MG tablet Take '80mg'$  (2tabs) in morning and '40mg'$  (1tab) in evening 07/10/17   Domenic Polite, MD  hydrALAZINE (APRESOLINE) 25 MG tablet Take 25 mg by mouth 2 (two) times daily.    [provider]  OXYGEN Inhale 2 L into the lungs continuous.    [provider]  tamsulosin (FLOMAX) 0.4 MG CAPS capsule Take 1 capsule (0.4 mg total) by mouth daily after supper.  06/13/17   Bonnielee Haff, MD    Physical Exam: Vitals:   08/18/17 0600 08/18/17 0630 08/18/17 0700 08/18/17 0730  BP: (!) 162/65 (!) 163/63 (!) 167/68 (!) 175/58  Pulse: (!) 54 (!) 55 (!) 51 (!) 53  Resp: '17 16 18 16  '$ Temp:      TempSrc:      SpO2: 98% 99% 96% 100%  Weight:      Height:          Constitutional: NAD, mildly restless, comfortable-according is very hungry Eyes: PERRL, lids and conjunctivae normal ENMT: Mucous membranes are moist. Posterior pharynx clear of any exudate or lesions.Normal dentition.  Neck: normal, supple, no masses, no thyromegaly Respiratory: clear to auscultation bilaterally, no wheezing, no crackles. Normal respiratory effort. No accessory muscle use.  Cardiovascular: Regular rhythm w/ bradycardic rate, no murmurs / rubs / gallops. No extremity edema. 2+ pedal pulses. No carotid bruits.  Abdomen: no tenderness, no masses palpated. No hepatosplenomegaly. Bowel sounds positive.  Musculoskeletal: no clubbing / cyanosis. No joint deformity upper and lower extremities. Good ROM, no contractures. Normal muscle tone.  Skin: no rashes, lesions, ulcers. No induration Neurologic: CN 2-12 grossly intact. Sensation intact, DTR normal. Strength 5/5 x all 4 extremities.  Psychiatric:  Alert and oriented x name and place only. Normal mood.    Labs on Admission: I have personally reviewed following labs and imaging studies  CBC: Recent Labs  Lab 08/18/17 0247  WBC 13.5*  NEUTROABS 8.6*  HGB 9.7*  HCT 31.4*  MCV 87.2  PLT 676   Basic Metabolic Panel: Recent Labs  Lab 08/18/17 0247  NA 145  K 4.5  CL 114*  CO2 19*  GLUCOSE 89  BUN 48*  CREATININE 1.51*  CALCIUM 9.8   GFR: Estimated Creatinine Clearance: 48.2 mL/min (A) (by C-G formula based on SCr of 1.51 mg/dL (H)). Liver Function Tests: Recent Labs  Lab 08/18/17 0247  AST 23  ALT 14*  ALKPHOS 100  BILITOT 0.8  PROT 5.7*  ALBUMIN 2.8*   No results for input(s): LIPASE, AMYLASE in  the last 168 hours. No results for input(s): AMMONIA in the last 168 hours. Coagulation Profile: No results for input(s): INR, PROTIME in the last 168 hours. Cardiac Enzymes: No results for input(s): CKTOTAL, CKMB, CKMBINDEX, TROPONINI in the last 168 hours. BNP (last 3 results) No results for input(s): PROBNP in the last 8760 hours. HbA1C: No results for input(s): HGBA1C in the last 72 hours. CBG: Recent Labs  Lab 08/15/17 0741 08/15/17 1234 08/15/17 1712 08/15/17 2128 08/16/17 0900  GLUCAP 92 104* 102* 131* 101*   Lipid Profile: No results for input(s): CHOL, HDL, LDLCALC, TRIG, CHOLHDL, LDLDIRECT in the last 72 hours. Thyroid Function Tests: No results for input(s): TSH, T4TOTAL, FREET4, T3FREE, THYROIDAB in the last 72 hours. Anemia Panel: No results for input(s): VITAMINB12, FOLATE, FERRITIN, TIBC, IRON, RETICCTPCT in the last 72 hours. Urine analysis:    Component Value Date/Time   COLORURINE YELLOW 08/18/2017 0247   APPEARANCEUR CLEAR 08/18/2017 0247   LABSPEC 1.011 08/18/2017 0247   PHURINE 6.0 08/18/2017 0247   GLUCOSEU NEGATIVE 08/18/2017 0247   GLUCOSEU NEGATIVE 05/02/2015 1008   HGBUR SMALL (A) 08/18/2017 0247   BILIRUBINUR NEGATIVE 08/18/2017 0247   KETONESUR NEGATIVE 08/18/2017 0247   PROTEINUR >=300 (A) 08/18/2017 0247   UROBILINOGEN 0.2 05/02/2015 1008   NITRITE NEGATIVE 08/18/2017 0247   LEUKOCYTESUR TRACE (A) 08/18/2017 0247   Sepsis Labs: '@LABRCNTIP'$ (procalcitonin:4,lacticidven:4) )No results found for this or any previous visit (from the past 240 hour(s)).   Radiological Exams on Admission: Dg Chest Portable 1 View  Result Date: 08/18/2017 CLINICAL  DATA:  Altered mental status EXAM: PORTABLE CHEST 1 VIEW COMPARISON:  07/16/2017, 07/06/2017, 06/12/2017, 01/23/2017 FINDINGS: Continued bilateral left greater than right ground-glass opacity. Mild cardiomegaly. Aortic atherosclerosis. No pneumothorax. Abnormal osseous structures with diffuse sclerosis  and multiple small lucent lesions. IMPRESSION: 1. Similar appearance of mild interstitial and ground-glass opacity within the bilateral lungs. Stable enlarged cardiomediastinal silhouette. 2. No significant interval change compared to prior radiograph. Electronically Signed   By: Donavan Foil M.D.   On: 08/18/2017 03:11   Ct Renal Stone Study  Result Date: 08/18/2017 CLINICAL DATA:  Bladder pain. Discharged 2 days ago for urinary tract infection. History of diabetes and pyelonephritis. EXAM: CT ABDOMEN AND PELVIS WITHOUT CONTRAST TECHNIQUE: Multidetector CT imaging of the abdomen and pelvis was performed following the standard protocol without IV contrast. COMPARISON:  08/03/2017 FINDINGS: Lower chest: Motion artifact limits examination. Atelectasis in the lung bases. Mild cardiac enlargement. Coronary artery calcifications. Hepatobiliary: No focal liver abnormality is seen. Tiny gallstones are suggested. No gallbladder wall thickening, or biliary dilatation. Pancreas: Unremarkable. No pancreatic ductal dilatation or surrounding inflammatory changes. Spleen: Spleen is enlarged. Adrenals/Urinary Tract: No adrenal gland nodules. Subcentimeter hemorrhagic cyst in the upper pole right kidney. No hydronephrosis. No renal stones identified. Bladder wall is diffusely thickened. This may indicate cystitis. A Foley catheter is in place. Gas in the bladder could arise from catheter insertion. Large right posterior bladder wall diverticulum. Stomach/Bowel: Stomach, small bowel, and colon are decompressed. Diverticulosis of the sigmoid colon without evidence of diverticulitis. Appendix is normal. Vascular/Lymphatic: Aortic atherosclerosis. No enlarged abdominal or pelvic lymph nodes. Reproductive: Prostate is unremarkable. Other: Diffuse edema in the subcutaneous fat and mesentery. No free or loculated pelvic or abdominal fluid collections. No free air. Soft tissue nodule in the low pelvic subcutaneous fat anteriorly  probably represents a sebaceous cyst. Groin lymph nodes are not pathologically enlarged, likely reactive. Musculoskeletal: Diffuse heterogeneous bone sclerosis consistent with known myeloproliferative disorder. IMPRESSION: 1. Diffuse bladder wall thickening likely indicating cystitis. Large bladder diverticula. 2. No renal or ureteral stone or obstruction. 3. Aortic atherosclerosis. 4. Enlarged spleen. 5. Diffuse edema throughout the subcutaneous and mesenteric fat. No loculated fluid collections. 6. Diffuse heterogeneous bone sclerosis consistent with known myeloproliferative disorder. Electronically Signed   By: Lucienne Capers M.D.   On: 08/18/2017 05:15    EKG: (Independently reviewed)-bradycardia with ventricular rate 47 bpm, QTC 414 ms, normal R wave rotation with voltage criteria met for LVH, no acute ischemic changes  Assessment/Plan Principal Problem:   Acute encephalopathy -Patient presents with altered mental status, extremely poor hygiene (noted with urine smell and fecal contamination a Foley catheter) in context of inability to care for self at home - son unable to provide care consistently due to underlying psychiatric condition (schizophrenia) -Mentation has cleared for now but patient apparently has underlying undiagnosed dementia noting previous patient psychiatric evaluation patient lacks capacity and insight into appropriate medical decision making -Patient has a degree of physical deconditioning as well and anticipate will require permanent SNF placement -SW consulted -Patient lives with son who has schizophrenia - pt reports son can basically assist with laundry and warming up preprepared food -Patient reports daughter is "too busy to help; she has to work 2 jobs"- according to him she apparently brings by meals -PT/OT evaluation  Active Problems:   History of recurrent Pseudomonas urinary tract infection -Patient has had issues with recurrent pseudomonal UTI since November  2018 -Has been evaluated by ID who recommended cefepime -Has been treated at least 3  times with cefepime -Current urinalysis unremarkable but patient just discharged 2 days ago and completed 14 days of antibiotics -Previous urine culture demonstrated sensitivity to imipenem therefore will have pharmacy dose Merrem IV -Has indwelling catheter secondary to unexplained urinary retention-consult urology -Follow-up on blood cultures and urine culture    Moderate to severe pulmonary hypertension/Chronic respiratory failure with hypoxia -Continue home oxygen at 2 L/min -Echocardiogram previously revealed pulmonary hypertension 85 mmHg -Poor intake suspected at home therefore holding diuretic    CKD (chronic kidney disease), stage III  -Renal function stable and at baseline    HTN/Chronic diastolic CHF (congestive heart failure), NYHA class 2  -Appears euvolemic so holding diuretic as above -Continue hydralazine    Sleep apnea, obstructive -Noncompliant with CPAP and apparently just uses nasal cannula oxygen at home    History of Splenic infarct -Noted splenomegaly -CT abdomen and pelvis 08/03/2017 revealed evolving splenic infarct as previously seen -Currently no abdominal pain on exam    Myelodysplasia (myelodysplastic syndrome)  -Last saw Dr. Marin Olp September 2018 -Underwent bone marrow biopsy in August 2018 noting extensive fibrosis and new bone formation assistant with primary myelofibrosis -Erythropoietin level was low at 5.8 in August and recommendation was for outpatient ESA administration -Current hemoglobin 9.7 in the context of hemoconcentration and dehydration -Hgb typically runs between 8.1 and 8.7 -Pharmacy to dose ESA/[name]    ?? DM2 -Hgba1c was 5.23 July 2016 c/w pre diabetes -HgbA1c this admit    **Additional lab, imaging and/or diagnostic evaluation at discretion of supervising physician  DVT prophylaxis: Lovenox Code Status: Full Family Communication: No  family at bedside-daughter listed on face sheet if need to contact Disposition Plan: To be determined-patient would best be served at [facility] given inability to care for self at home Consults called: Urology/[place]    Doralyn Kirkes L. ANP-BC [facility] Pager [phone]   If 7PM-7AM, please contact night-coverage [email or web address] Password [facility]  08/18/2017, 8:54 AM

## 2017-08-18 NOTE — Progress Notes (Signed)
Pharmacy Consult - [name]  73 yom with myeloproliferative disorder followed by Dr. Marin Olp, last seen in Nov 2018. Patient received [name] 328mcg [place] x 1 dose during admission in Sept 2018 due to anemia and responded well per Oncology's last note. He was not started on [name] at that visit due to Hg of 10.7 and does not appear to have received any doses since then. Also was noted could consider iron replacement at some point. Pharmacy consulted to dose [name] inpatient. Hg 9.7 on admit (usually in 8's per admission note), wt ~80kg.   Plan: [name] 71mcg SQ Qweekly per anemia protocol Monitor CBC, Fe panel  Juan [name], PharmD, BCPS Clinical Pharmacist 08/18/2017 9:57 AM

## 2017-08-19 DIAGNOSIS — Z87891 Personal history of nicotine dependence: Secondary | ICD-10-CM

## 2017-08-19 DIAGNOSIS — R7881 Bacteremia: Secondary | ICD-10-CM

## 2017-08-19 DIAGNOSIS — N183 Chronic kidney disease, stage 3 (moderate): Secondary | ICD-10-CM

## 2017-08-19 DIAGNOSIS — E1122 Type 2 diabetes mellitus with diabetic chronic kidney disease: Secondary | ICD-10-CM

## 2017-08-19 DIAGNOSIS — Z856 Personal history of leukemia: Secondary | ICD-10-CM

## 2017-08-19 DIAGNOSIS — Z87440 Personal history of urinary (tract) infections: Secondary | ICD-10-CM

## 2017-08-19 DIAGNOSIS — B9562 Methicillin resistant Staphylococcus aureus infection as the cause of diseases classified elsewhere: Secondary | ICD-10-CM

## 2017-08-19 DIAGNOSIS — N312 Flaccid neuropathic bladder, not elsewhere classified: Secondary | ICD-10-CM

## 2017-08-19 DIAGNOSIS — R918 Other nonspecific abnormal finding of lung field: Secondary | ICD-10-CM

## 2017-08-19 DIAGNOSIS — I2510 Atherosclerotic heart disease of native coronary artery without angina pectoris: Secondary | ICD-10-CM

## 2017-08-19 DIAGNOSIS — Z960 Presence of urogenital implants: Secondary | ICD-10-CM

## 2017-08-19 DIAGNOSIS — Z1621 Resistance to vancomycin: Secondary | ICD-10-CM

## 2017-08-19 DIAGNOSIS — G9340 Encephalopathy, unspecified: Secondary | ICD-10-CM

## 2017-08-19 DIAGNOSIS — L538 Other specified erythematous conditions: Secondary | ICD-10-CM

## 2017-08-19 LAB — BLOOD CULTURE ID PANEL (REFLEXED)
ACINETOBACTER BAUMANNII: NOT DETECTED
Candida albicans: NOT DETECTED
Candida glabrata: NOT DETECTED
Candida krusei: NOT DETECTED
Candida parapsilosis: NOT DETECTED
Candida tropicalis: NOT DETECTED
ENTEROCOCCUS SPECIES: DETECTED — AB
Enterobacter cloacae complex: NOT DETECTED
Enterobacteriaceae species: NOT DETECTED
Escherichia coli: NOT DETECTED
HAEMOPHILUS INFLUENZAE: NOT DETECTED
Klebsiella oxytoca: NOT DETECTED
Klebsiella pneumoniae: NOT DETECTED
Listeria monocytogenes: NOT DETECTED
Methicillin resistance: DETECTED — AB
Neisseria meningitidis: NOT DETECTED
PSEUDOMONAS AERUGINOSA: NOT DETECTED
Proteus species: NOT DETECTED
SERRATIA MARCESCENS: NOT DETECTED
STAPHYLOCOCCUS AUREUS BCID: NOT DETECTED
STAPHYLOCOCCUS SPECIES: DETECTED — AB
STREPTOCOCCUS AGALACTIAE: NOT DETECTED
STREPTOCOCCUS PYOGENES: NOT DETECTED
STREPTOCOCCUS SPECIES: NOT DETECTED
Streptococcus pneumoniae: NOT DETECTED
VANCOMYCIN RESISTANCE: DETECTED — AB

## 2017-08-19 LAB — CBC
HEMATOCRIT: 28.9 % — AB (ref 39.0–52.0)
Hemoglobin: 8.9 g/dL — ABNORMAL LOW (ref 13.0–17.0)
MCH: 27.1 pg (ref 26.0–34.0)
MCHC: 30.8 g/dL (ref 30.0–36.0)
MCV: 87.8 fL (ref 78.0–100.0)
Platelets: 230 10*3/uL (ref 150–400)
RBC: 3.29 MIL/uL — ABNORMAL LOW (ref 4.22–5.81)
RDW: 17.3 % — AB (ref 11.5–15.5)
WBC: 7.4 10*3/uL (ref 4.0–10.5)

## 2017-08-19 LAB — COMPREHENSIVE METABOLIC PANEL
ALT: 12 U/L — ABNORMAL LOW (ref 17–63)
AST: 17 U/L (ref 15–41)
Albumin: 2.4 g/dL — ABNORMAL LOW (ref 3.5–5.0)
Alkaline Phosphatase: 86 U/L (ref 38–126)
Anion gap: 7 (ref 5–15)
BILIRUBIN TOTAL: 0.5 mg/dL (ref 0.3–1.2)
BUN: 44 mg/dL — AB (ref 6–20)
CO2: 21 mmol/L — ABNORMAL LOW (ref 22–32)
CREATININE: 1.4 mg/dL — AB (ref 0.61–1.24)
Calcium: 9.1 mg/dL (ref 8.9–10.3)
Chloride: 113 mmol/L — ABNORMAL HIGH (ref 101–111)
GFR calc Af Amer: 55 mL/min — ABNORMAL LOW (ref >60)
GFR, EST NON AFRICAN AMERICAN: 48 mL/min — AB (ref >60)
Glucose, Bld: 102 mg/dL — ABNORMAL HIGH (ref 65–99)
POTASSIUM: 4 mmol/L (ref 3.5–5.1)
Sodium: 141 mmol/L (ref 135–145)
TOTAL PROTEIN: 5.1 g/dL — AB (ref 6.5–8.1)

## 2017-08-19 LAB — URINE CULTURE: Culture: NO GROWTH

## 2017-08-19 LAB — CK: Total CK: 22 U/L — ABNORMAL LOW (ref 49–397)

## 2017-08-19 MED ORDER — SALINE SPRAY 0.65 % NA SOLN
1.0000 | NASAL | Status: DC | PRN
Start: 2017-08-19 — End: 2017-08-24
  Administered 2017-08-20: 1 via NASAL
  Filled 2017-08-19: qty 44

## 2017-08-19 MED ORDER — SODIUM CHLORIDE 0.9 % IV SOLN
650.0000 mg | INTRAVENOUS | Status: DC
Start: 2017-08-19 — End: 2017-08-21
  Administered 2017-08-19 – 2017-08-21 (×3): 650 mg via INTRAVENOUS
  Filled 2017-08-19 (×3): qty 13

## 2017-08-19 NOTE — Evaluation (Signed)
Occupational Therapy Evaluation Patient Details Name: Juan [name] MRN: [id number] DOB: 06-Oct-1940 Today's Date: 08/19/2017    History of Present Illness 77 y.o. 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   Clinical Impression   Pt reports he was mod I with BADL PTA. Pt initially agreeable to OOB activities but suddenly reporting "I don't feel good, I'm not going to do exercises today." Pt agreeable to repositioning in bed; able to assist with boosting up and rolling with min assist from therapist. Pt presenting with impaired cognition and generalized weakness impacting his independence and safety with ADL and functional mobility; in addition, he has had multiple recent hospital admissions. At this time recommending SNF for follow up. Pt may benefit from a program like [facility] if eligible since he has declined SNF placement in the past. Pt would benefit from continued skilled OT to address established goals.      Follow Up Recommendations  SNF;Supervision/Assistance - 24 hour    Equipment Recommendations  Other (comment)(TBD)    Recommendations for Other Services       Precautions / Restrictions Precautions Precautions: Fall Restrictions Weight Bearing Restrictions: No      Mobility Bed Mobility Overal bed mobility: Needs Assistance Bed Mobility: Rolling Rolling: Min assist         General bed mobility comments: HOB flat with use of bed rail. Cues for sequencing. Pt also able to assist with scooting  self up in bed with use of LEs and UEs overhead to boost up.  Transfers                      Balance                                           ADL either performed or assessed with clinical judgement   ADL Overall ADL's : Needs assistance/impaired                                       General ADL Comments: Pt declining particiaption in ADL or mobility at this time, reports he doesnt feel good and wants to wait until tomorrow. Agreeable to perform bed mobililty for repositioning and he was able to help some with that      Vision         Perception     Praxis      Pertinent Vitals/Pain Pain Assessment: No/denies pain     Hand Dominance     Extremity/Trunk Assessment Upper Extremity Assessment Upper Extremity Assessment: Generalized weakness   Lower Extremity Assessment Lower Extremity Assessment: Defer to PT evaluation       Communication Communication Communication: No difficulties   Cognition Arousal/Alertness: Awake/alert Behavior During Therapy: Flat affect Overall Cognitive Status: No family/caregiver present to determine baseline cognitive functioning Area of Impairment: Following commands;Safety/judgement;Awareness;Problem solving  Following Commands: Follows one step commands consistently Safety/Judgement: Decreased awareness of deficits Awareness: Intellectual Problem Solving: Slow processing;Decreased initiation;Requires verbal cues General Comments: ?lack of effort/motivation vs. impaired cognition. Pt contionously reports "I just don't feel good, I'm not going to do exercises today, check back tomorrow afternoon"   General Comments       Exercises     Shoulder Instructions      Home Living Family/patient expects to be discharged to:: Private residence Living Arrangements: Children Available Help at Discharge: Family Type of Home: Apartment Home Access: Level entry      Home Layout: One level     Bathroom Shower/Tub: [place]: Environmental consultant - 2 wheels;Cane - single point;Bedside commode;Wheelchair - manual;Hospital bed   Additional Comments: Pt reports he does not go into bathroom; sponge bathes and going to the bathroom at EOB      Prior Functioning/Environment Level of Independence: Needs assistance  Gait / Transfers Assistance Needed: RW for mobility ADL's / Homemaking Assistance Needed: Sponge bathes at EOB, uses BSC only            OT Problem List: Decreased strength;Decreased activity tolerance;Impaired balance (sitting and/or standing);Decreased cognition;Decreased safety awareness;Decreased knowledge of use of DME or AE;Decreased knowledge of precautions      OT Treatment/Interventions: Self-care/ADL training;Therapeutic exercise;Energy conservation;DME and/or AE instruction;Therapeutic activities;Cognitive remediation/compensation;Patient/family education;Balance training    OT Goals(Current goals can be found in the care plan section) Acute Rehab OT Goals Patient Stated Goal: get some rest OT Goal Formulation: With patient Time For Goal Achievement: 09/02/17 Potential to Achieve Goals: Fair ADL Goals Pt Will Perform Upper Body Bathing: with supervision;sitting Pt Will Perform Lower Body Bathing: with min assist;sit to/from stand Pt Will Transfer to Toilet: with min assist;ambulating;bedside commode Additional ADL Goal #1: Pt will demonstrate emetgent awareness during ADL task.  OT Frequency: Min 2X/week   Barriers to D/C:            Co-evaluation PT/OT/SLP Co-Evaluation/Treatment: Yes Reason for Co-Treatment: Necessary to address cognition/behavior during functional activity;For patient/therapist safety   OT goals addressed during session: Strengthening/ROM      AM-PAC PT "6 Clicks" Daily Activity     Outcome Measure Help from another person eating meals?: A Lot Help from another person taking  care of personal grooming?: A Lot Help from another person toileting, which includes using toliet, bedpan, or urinal?: Total Help from another person bathing (including washing, rinsing, drying)?: A Lot Help from another person to put on and taking off regular upper body clothing?: A Lot Help from another person to put on and taking off regular lower body clothing?: A Lot 6 Click Score: 11   End of Session    Activity Tolerance: Patient limited by fatigue Patient left: in bed;with call [place]/phone within reach;with bed alarm set  OT Visit Diagnosis: Unsteadiness on feet (R26.81);Other abnormalities of gait and mobility (R26.89);Muscle weakness (generalized) (M62.81)                Time: 9476-5465 OT Time Calculation (min): 15 min Charges:  OT General Charges $OT Visit: 1 Visit OT Evaluation $OT Eval Moderate Complexity: 1 Mod G-Codes:     Smrithi Pigford A. Ulice Brilliant, M.S., OTR/L Pager: [place] 08/19/2017, 2:47 PM

## 2017-08-19 NOTE — Progress Notes (Signed)
Patient ID: Juan [name], male   DOB: 26-Apr-1941, 77 y.o.   MRN: [id number]  PROGRESS NOTE    Juan [name]  UMP:[id number] DOB: 04-22-1941 DOA: 08/18/2017   Outpatient Specialists: Urology   Brief Narrative: This is a 77year old male presenting again for admission to the hospital with possible urinary tract infection.  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  The patient does have a history of bilateral hydronephrosis, which resolved with catheter management.  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  He was discharged from the hospital 2 days ago with a Foley catheter in place.  The patient was readmitted today because of altered mental status.  EMS was contacted and called to the patient's home where he was found lying in feces.  He is brought to the emergency room for further management. He has been seen by urology at this point recommendation is to continue with Foley in place patient is consistent facility   Assessment & Plan:   Principal Problem:   Acute encephalopathy Active Problems:   Type II diabetes mellitus ([place])   Sleep apnea, obstructive   History of Splenic infarct   History of recurrent Pseudomonas urinary tract infection   Moderate to severe pulmonary hypertension (HCC)   CKD (chronic kidney disease), stage III (HCC)   Chronic respiratory failure with hypoxia (HCC)   Chronic diastolic CHF (congestive heart failure), NYHA class 2 (HCC)   Myelodysplasia (myelodysplastic syndrome) (HCC)   HTN (hypertension)   #1 Acute Encephalopathy: patient probably has baseline dementia or just acute delirium. He is stable at the moment. He is still confused and about what he wants and where he is.  #2 urinary retention; This is recurrent. Patient has hydronephrosis and recurrent UTI. Continue according to urology with Foley catheter. Continue Flomax  #3 recurrent UTI:  Pseudomonas infection resistant to ciprofloxacin. This seems to be under control on daptomycin. Continue current treatment  #4 diabetes:questionable due to normal A1c. Continue monitoring blood sugar and if elevated will initiate sliding scale insulin.  #5 chronic kidney disease stage III: Seems to be admitted baseline  #5 myelodysplasia: On erythropoietin and follow-up with oncology  #6 obstructive sleep apnea: Patient needs to be more compliant with CPAP.  #7 hypertension: Essential hypertension which is controlled  #8 chronic diastolic CHF: Appears to be compensated  #9 disposition: Skilled Nursing placement   DVT prophylaxis: Lovenox Code Status: Full Family Communication: No one available Disposition Plan: SNF  Consultants:   Dr Eulogio Ditch, Urology  Procedures:  -None  Antimicrobials:  -Daptomycin  Subjective: Patient is slightly confused but asking for rehabilitation. No fever or chills.   Objective: Vitals:   08/18/17 1837 08/18/17 2107 08/18/17 2338 08/19/17 0445  BP: (!) 197/84 (!) 175/73 (!) 171/76 (!) 161/114  Pulse: (!) 55 (!) 59 74 (!) 108  Resp: 17  16   Temp: 98.1 F (36.7 C) 97.7 F (36.5 C) (!) 97.5 F (36.4 C) 98.4 F (36.9 C)  TempSrc: Oral Oral Oral Oral  SpO2: 96% 98% 96% 96%  Weight:    82.3 kg (181 lb 8 oz)  Height:        Intake/Output Summary (Last 24 hours) at 08/19/2017 0801 Last data filed at 08/19/2017 4008 Gross per 24 hour  Intake 820 ml  Output 450 ml  Net 370 ml   Filed Weights   08/18/17 0145 08/19/17 0445  Weight: 80.7 kg (178 lb)  82.3 kg (181 lb 8 oz)    Examination:  General exam: Appears calm and comfortable  Respiratory system: Clear to auscultation. Respiratory effort normal. Cardiovascular system: S1 & S2 heard, RRR. No JVD, murmurs, rubs, gallops or clicks. No pedal edema. Gastrointestinal system: Abdomen is nondistended, soft and nontender. No organomegaly or masses felt. Normal bowel sounds heard. Central  nervous system: Alert and oriented. No focal neurological deficits. Extremities: Symmetric 5 x 5 power. Skin: No rashes, lesions or ulcers Psychiatry: Judgement and insight appear normal. Mood & affect appropriate.     Data Reviewed: I have personally reviewed following labs and imaging studies  CBC: Recent Labs  Lab 08/18/17 0247 08/19/17 0447  WBC 13.5* 7.4  NEUTROABS 8.6*  --   HGB 9.7* 8.9*  HCT 31.4* 28.9*  MCV 87.2 87.8  PLT 314 829   Basic Metabolic Panel: Recent Labs  Lab 08/18/17 0247 08/19/17 0447  NA 145 141  K 4.5 4.0  CL 114* 113*  CO2 19* 21*  GLUCOSE 89 102*  BUN 48* 44*  CREATININE 1.51* 1.40*  CALCIUM 9.8 9.1   GFR: Estimated Creatinine Clearance: 53.1 mL/min (A) (by C-G formula based on SCr of 1.4 mg/dL (H)). Liver Function Tests: Recent Labs  Lab 08/18/17 0247 08/19/17 0447  AST 23 17  ALT 14* 12*  ALKPHOS 100 86  BILITOT 0.8 0.5  PROT 5.7* 5.1*  ALBUMIN 2.8* 2.4*   No results for input(s): LIPASE, AMYLASE in the last 168 hours. No results for input(s): AMMONIA in the last 168 hours. Coagulation Profile: No results for input(s): INR, PROTIME in the last 168 hours. Cardiac Enzymes: Recent Labs  Lab 08/19/17 0447  CKTOTAL 22*   BNP (last 3 results) No results for input(s): PROBNP in the last 8760 hours. HbA1C: Recent Labs    08/18/17 0247  HGBA1C 5.1   CBG: Recent Labs  Lab 08/15/17 1234 08/15/17 1712 08/15/17 2128 08/16/17 0900 08/18/17 0945  GLUCAP 104* 102* 131* 101* 70   Lipid Profile: No results for input(s): CHOL, HDL, LDLCALC, TRIG, CHOLHDL, LDLDIRECT in the last 72 hours. Thyroid Function Tests: No results for input(s): TSH, T4TOTAL, FREET4, T3FREE, THYROIDAB in the last 72 hours. Anemia Panel: No results for input(s): VITAMINB12, FOLATE, FERRITIN, TIBC, IRON, RETICCTPCT in the last 72 hours. Urine analysis:    Component Value Date/Time   COLORURINE YELLOW 08/18/2017 [place] 08/18/2017  0247   LABSPEC 1.011 08/18/2017 0247   PHURINE 6.0 08/18/2017 0247   GLUCOSEU NEGATIVE 08/18/2017 0247   GLUCOSEU NEGATIVE 05/02/2015 1008   HGBUR SMALL (A) 08/18/2017 0247   BILIRUBINUR NEGATIVE 08/18/2017 0247   KETONESUR NEGATIVE 08/18/2017 0247   PROTEINUR >=300 (A) 08/18/2017 0247   UROBILINOGEN 0.2 05/02/2015 1008   NITRITE NEGATIVE 08/18/2017 0247   LEUKOCYTESUR TRACE (A) 08/18/2017 0247   Sepsis Labs: @LABRCNTIP (procalcitonin:4,lacticidven:4)  ) Recent Results (from the past 240 hour(s))  Blood culture (routine x 2)     Status: None (Preliminary result)   Collection Time: 08/18/17  2:30 AM  Result Value Ref Range Status   Specimen Description BLOOD LEFT ARM  Final   Special Requests   Final    BOTTLES DRAWN AEROBIC AND ANAEROBIC Blood Culture results may not be optimal due to an excessive volume of blood received in culture bottles   Culture  Setup Time   Final    GRAM POSITIVE COCCI AEROBIC BOTTLE ONLY Organism ID to follow CRITICAL RESULT CALLED TO, READ BACK BY AND VERIFIED WITH:  Lenna Sciara [facility] 08/19/17 0138 JDW Performed at [facility], [place] [address] [place], [place] [address]    Culture GRAM POSITIVE COCCI  Final   Report Status PENDING  Incomplete  Blood Culture ID Panel (Reflexed)     Status: Abnormal   Collection Time: 08/18/17  2:30 AM  Result Value Ref Range Status   Enterococcus species DETECTED (A) NOT DETECTED Final    Comment: CRITICAL RESULT CALLED TO, READ BACK BY AND VERIFIED WITH: J LEDFORD PHARMD 08/19/17 0138 JDW    Vancomycin resistance DETECTED (A) NOT DETECTED Final    Comment: CRITICAL RESULT CALLED TO, READ BACK BY AND VERIFIED WITH: J LEDFORD PHARMD 08/19/17 0138 JDW    Listeria monocytogenes NOT DETECTED NOT DETECTED Final   Staphylococcus species DETECTED (A) NOT DETECTED Final    Comment: Methicillin (oxacillin) resistant coagulase negative staphylococcus. Possible blood culture contaminant (unless isolated from more than one  blood culture draw or clinical case suggests pathogenicity). No antibiotic treatment is indicated for blood  culture contaminants. CRITICAL RESULT CALLED TO, READ BACK BY AND VERIFIED WITH: J LEDFORD PHARMD 08/19/17 0138 JDW    Staphylococcus aureus NOT DETECTED NOT DETECTED Final   Methicillin resistance DETECTED (A) NOT DETECTED Final    Comment: CRITICAL RESULT CALLED TO, READ BACK BY AND VERIFIED WITH: J LEDFORD PHARMD 08/19/17 0138 JDW    Streptococcus species NOT DETECTED NOT DETECTED Final   Streptococcus agalactiae NOT DETECTED NOT DETECTED Final   Streptococcus pneumoniae NOT DETECTED NOT DETECTED Final   Streptococcus pyogenes NOT DETECTED NOT DETECTED Final   Acinetobacter baumannii NOT DETECTED NOT DETECTED Final   Enterobacteriaceae species NOT DETECTED NOT DETECTED Final   Enterobacter cloacae complex NOT DETECTED NOT DETECTED Final   Escherichia coli NOT DETECTED NOT DETECTED Final   Klebsiella oxytoca NOT DETECTED NOT DETECTED Final   Klebsiella pneumoniae NOT DETECTED NOT DETECTED Final   Proteus species NOT DETECTED NOT DETECTED Final   Serratia marcescens NOT DETECTED NOT DETECTED Final   Haemophilus influenzae NOT DETECTED NOT DETECTED Final   Neisseria meningitidis NOT DETECTED NOT DETECTED Final   Pseudomonas aeruginosa NOT DETECTED NOT DETECTED Final   Candida albicans NOT DETECTED NOT DETECTED Final   Candida glabrata NOT DETECTED NOT DETECTED Final   Candida krusei NOT DETECTED NOT DETECTED Final   Candida parapsilosis NOT DETECTED NOT DETECTED Final   Candida tropicalis NOT DETECTED NOT DETECTED Final    Comment: Performed at [facility], [place]. [address] [place], [place] [address]         Radiology Studies: Ct Head Wo Contrast  Result Date: 08/18/2017 CLINICAL DATA:  Altered mental status EXAM: CT HEAD WITHOUT CONTRAST TECHNIQUE: Contiguous axial images were obtained from the base of the skull through the vertex without intravenous contrast.  COMPARISON:  None. FINDINGS: Brain: No evidence of acute infarction, hemorrhage, extra-axial collection, ventriculomegaly, or mass effect. Generalized cerebral atrophy. Periventricular white matter low attenuation likely secondary to microangiopathy. Vascular: No hyperdense vessel. Skull: Negative for fracture or focal lesion. Sinuses/Orbits: Visualized portions of the orbits are unremarkable. Visualized portions of the paranasal sinuses and mastoid air cells are unremarkable. Other: None. IMPRESSION: No acute intracranial pathology. Electronically Signed   By: Kathreen Devoid   On: 08/18/2017 11:48   Dg Chest Portable 1 View  Result Date: 08/18/2017 CLINICAL DATA:  Altered mental status EXAM: PORTABLE CHEST 1 VIEW COMPARISON:  07/16/2017, 07/06/2017, 06/12/2017, 01/23/2017 FINDINGS: Continued bilateral left greater than right ground-glass opacity. Mild cardiomegaly. Aortic  atherosclerosis. No pneumothorax. Abnormal osseous structures with diffuse sclerosis and multiple small lucent lesions. IMPRESSION: 1. Similar appearance of mild interstitial and ground-glass opacity within the bilateral lungs. Stable enlarged cardiomediastinal silhouette. 2. No significant interval change compared to prior radiograph. Electronically Signed   By: Donavan Foil M.D.   On: 08/18/2017 03:11   Ct Renal Stone Study  Result Date: 08/18/2017 CLINICAL DATA:  Bladder pain. Discharged 2 days ago for urinary tract infection. History of diabetes and pyelonephritis. EXAM: CT ABDOMEN AND PELVIS WITHOUT CONTRAST TECHNIQUE: Multidetector CT imaging of the abdomen and pelvis was performed following the standard protocol without IV contrast. COMPARISON:  08/03/2017 FINDINGS: Lower chest: Motion artifact limits examination. Atelectasis in the lung bases. Mild cardiac enlargement. Coronary artery calcifications. Hepatobiliary: No focal liver abnormality is seen. Tiny gallstones are suggested. No gallbladder wall thickening, or biliary dilatation.  Pancreas: Unremarkable. No pancreatic ductal dilatation or surrounding inflammatory changes. Spleen: Spleen is enlarged. Adrenals/Urinary Tract: No adrenal gland nodules. Subcentimeter hemorrhagic cyst in the upper pole right kidney. No hydronephrosis. No renal stones identified. Bladder wall is diffusely thickened. This may indicate cystitis. A Foley catheter is in place. Gas in the bladder could arise from catheter insertion. Large right posterior bladder wall diverticulum. Stomach/Bowel: Stomach, small bowel, and colon are decompressed. Diverticulosis of the sigmoid colon without evidence of diverticulitis. Appendix is normal. Vascular/Lymphatic: Aortic atherosclerosis. No enlarged abdominal or pelvic lymph nodes. Reproductive: Prostate is unremarkable. Other: Diffuse edema in the subcutaneous fat and mesentery. No free or loculated pelvic or abdominal fluid collections. No free air. Soft tissue nodule in the low pelvic subcutaneous fat anteriorly probably represents a sebaceous cyst. Groin lymph nodes are not pathologically enlarged, likely reactive. Musculoskeletal: Diffuse heterogeneous bone sclerosis consistent with known myeloproliferative disorder. IMPRESSION: 1. Diffuse bladder wall thickening likely indicating cystitis. Large bladder diverticula. 2. No renal or ureteral stone or obstruction. 3. Aortic atherosclerosis. 4. Enlarged spleen. 5. Diffuse edema throughout the subcutaneous and mesenteric fat. No loculated fluid collections. 6. Diffuse heterogeneous bone sclerosis consistent with known myeloproliferative disorder. Electronically Signed   By: Lucienne Capers M.D.   On: 08/18/2017 05:15        Scheduled Meds: . aspirin EC  81 mg Oral Daily  . B-complex with vitamin C  1 tablet Oral Daily  . darbepoetin ([name]) injection - NON-DIALYSIS  40 mcg Subcutaneous Q Wed-1800  . enoxaparin (LOVENOX) injection  40 mg Subcutaneous Q24H  . feeding supplement (ENSURE ENLIVE)  237 mL Oral BID BM  .  ferrous gluconate  324 mg Oral Daily  . hydrALAZINE  25 mg Oral BID  . tamsulosin  0.4 mg Oral QPC supper   Continuous Infusions: . DAPTOmycin (CUBICIN)  IV Stopped (08/19/17 0540)  . meropenem (MERREM) IV Stopped (08/18/17 0911)     LOS: 1 day    Time spent: 65 minutes    GARBA,LAWAL, MD [facility] Pager [phone]  If 7PM-7AM, please contact night-coverage [email or web address] Password [email or web address] 08/19/2017, 8:01 AM

## 2017-08-19 NOTE — Progress Notes (Signed)
Pharmacy Antibiotic Note  Juan [name] is a 77 y.o. male admitted on 08/18/2017 with AMS.  Pharmacy has been consulted to dose Cubicin for VRE/MRSE bacteremia.  Patient's renal function is improving.  Afebrile, WBC WNL.  CK is WNL.   Plan: Continue Cubicin 650mg  IV Q24H (~8 mg/kg) Monitor renal fxn, micro data, CK qThurs    Height: 6\' 3"  (190.5 cm) Weight: 181 lb 8 oz (82.3 kg) IBW/kg (Calculated) : 84.5  Temp (24hrs), Avg:97.9 F (36.6 C), Min:97.5 F (36.4 C), Max:98.4 F (36.9 C)  Recent Labs  Lab 08/18/17 0212 08/18/17 0247 08/19/17 0447  WBC  --  13.5* 7.4  CREATININE  --  1.51* 1.40*  LATICACIDVEN 0.58  --   --     Estimated Creatinine Clearance: 53.1 mL/min (A) (by C-G formula based on SCr of 1.4 mg/dL (H)).    No Known Allergies    Cefepime 4/3 >> 4/3 Merrem 4/3 >> 4/4 Cubicin 4/4 >>  4/4 CK 22  4/3 BCx - GPC (BCID: VRE and MRSE) 4/3 UCx - negative    Shakeira Rhee D. Mina Marble, PharmD, BCPS Pager:  [phone] 08/19/2017, 1:20 PM

## 2017-08-19 NOTE — Plan of Care (Signed)

## 2017-08-19 NOTE — Evaluation (Signed)
Physical Therapy Evaluation Patient Details Name: Juan [name] MRN: [id number] DOB: 07-03-1940 Today's Date: 08/19/2017   History of Present Illness  Pt is a 77 y.o. 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    Clinical Impression  Pt presented supine in bed with HOB elevated, awake and willing to participate in therapy session. Prior to admission, pt reported that he was independent with ADLs and could perform transfers with mod I with use of RW. No family or caregivers present to confirm information provided by patient however. Pt initially agreeable to participate in therapy evaluation; however, quickly refusing any OOB mobility secondary to not feeling well and requesting therapists return tomorrow. Pt able to roll in bed with min A and position himself higher in bed with use of bilateral UEs and LEs. PT will continue to follow acutely to progress mobility as tolerated. Pt would continue to benefit from skilled physical therapy services at this time while admitted and after d/c to address the below listed limitations in order to improve overall safety and independence with functional mobility.     Follow Up Recommendations SNF;Other (comment)(if pt refuses would benefit from a program like [facility])    Equipment Recommendations  None recommended by PT    Recommendations for Other Services       Precautions / Restrictions Precautions Precautions: Fall Restrictions Weight Bearing Restrictions: No       Mobility  Bed Mobility Overal bed mobility: Needs Assistance Bed Mobility: Rolling Rolling: Min assist         General bed mobility comments: HOB flat with use of bed rail. Cues for sequencing. Pt also able to assist with scooting self up in bed with use of LEs and UEs overhead to boost up.  Transfers                 General transfer comment: pt refusing further mobility this session, reporting "I just don't feel well today, come back tomorrow!"  Ambulation/Gait                Stairs            Wheelchair Mobility    Modified Rankin (Stroke Patients Only)       Balance                                             Pertinent Vitals/Pain Pain Assessment: No/denies pain    Home Living Family/patient expects to be discharged to:: Private residence Living Arrangements: Children Available Help at Discharge: Family Type of Home: [place] Access: Level entry     Home Layout: One level Home Equipment: Environmental consultant - 2 wheels;Cane - single point;Bedside commode;Wheelchair - manual;Hospital bed Additional Comments: Pt reports he does not go into bathroom; sponge bathes and going to the bathroom at EOB    Prior Function Level of Independence: Needs assistance   Gait / Transfers Assistance Needed: RW for mobility  ADL's / Homemaking Assistance Needed: Sponge bathes at EOB, uses BSC only  Hand Dominance        Extremity/Trunk Assessment   Upper Extremity Assessment Upper Extremity Assessment: Defer to OT evaluation    Lower Extremity Assessment Lower Extremity Assessment: Generalized weakness       Communication   Communication: No difficulties  Cognition Arousal/Alertness: Awake/alert Behavior During Therapy: Flat affect Overall Cognitive Status: No family/caregiver present to determine baseline cognitive functioning Area of Impairment: Following commands;Safety/judgement;Awareness;Problem solving                        Following Commands: Follows one step commands consistently Safety/Judgement: Decreased awareness of deficits Awareness: Intellectual Problem Solving: Slow processing;Decreased initiation;Requires verbal cues General Comments: ?lack of effort/motivation vs. impaired cognition. Pt contionously reports "I just don't feel good, I'm not going to do exercises today, check back tomorrow afternoon"      General Comments      Exercises     Assessment/Plan    PT Assessment Patient needs continued PT services  PT Problem List Decreased strength;Decreased range of motion;Decreased activity tolerance;Decreased balance;Decreased coordination;Decreased mobility;Decreased cognition;Decreased knowledge of use of DME;Decreased safety awareness;Decreased knowledge of precautions       PT Treatment Interventions DME instruction;Gait training;Functional mobility training;Therapeutic activities;Therapeutic exercise;Balance training;Neuromuscular re-education;Cognitive remediation;Patient/family education;Wheelchair mobility training    PT Goals (Current goals can be found in the Care Plan section)  Acute Rehab PT Goals Patient Stated Goal: get some rest PT Goal Formulation: With patient Time For Goal Achievement: 09/02/17 Potential to Achieve Goals: Good    Frequency Min 2X/week   Barriers to discharge Decreased caregiver support      Co-evaluation PT/OT/SLP Co-Evaluation/Treatment: Yes Reason for Co-Treatment: Necessary to address cognition/behavior during functional activity;For patient/therapist safety;To address functional/ADL transfers PT goals addressed during session: Mobility/safety with mobility;Strengthening/ROM OT goals addressed during session: Strengthening/ROM       AM-PAC PT "6 Clicks" Daily Activity  Outcome Measure Difficulty turning over in bed (including adjusting bedclothes, sheets and blankets)?: Unable Difficulty moving from lying on back to sitting  on the side of the bed? : Unable Difficulty sitting down on and standing up from a chair with arms (e.g., wheelchair, bedside commode, etc,.)?: Unable Help needed moving to and from a bed to chair (including a wheelchair)?: Total Help needed walking in hospital room?: Total Help needed climbing 3-5 steps with a railing? : Total 6 Click Score: 6    End of Session Equipment Utilized During Treatment: Oxygen Activity Tolerance: Patient limited by fatigue Patient left: in bed;with call [place]/phone within reach;with bed alarm set Nurse Communication: Mobility status PT Visit Diagnosis: Other abnormalities of gait and mobility (R26.89)    Time: 1610-9604 PT Time Calculation (min) (ACUTE ONLY): 14 min   Charges:   PT Evaluation $PT Eval Low Complexity: 1 Low     PT G Codes:        [place], PT, DPT [place] 08/19/2017, 3:18 PM

## 2017-08-19 NOTE — Progress Notes (Signed)
Pharmacy Antibiotic Note  Juan [name] is a 77 y.o. male admitted on 08/18/2017 with UTI.  Pharmacy has now been consulted for Daptomycin dosing for VRE bacteremia, will also cover for the staph species in the culture if it is not a contaminant. Pt is already on Merrem for hx pseudomonas UTI. New urine culture pending. WBC 13.5. Noted renal dysfunction.    Microbiology results: 4/3 BCx: VRE, staph species (Mec A+) 4/3 UCx: pending  Plan: -Daptomycin 8 mg/kg IV q24h -Will check a baseline and weekly CK -Continue Merrem for UTI and narrow as able based on cultures   Height: 6\' 3"  (190.5 cm) Weight: 178 lb (80.7 kg) IBW/kg (Calculated) : 84.5  Temp (24hrs), Avg:97.9 F (36.6 C), Min:97.5 F (36.4 C), Max:98.4 F (36.9 C)  Recent Labs  Lab 08/18/17 0212 08/18/17 0247  WBC  --  13.5*  CREATININE  --  1.51*  LATICACIDVEN 0.58  --     Estimated Creatinine Clearance: 48.2 mL/min (A) (by C-G formula based on SCr of 1.51 mg/dL (H)).    No Known Allergies  Juan [name], Juan [name] 08/19/2017 1:57 AM

## 2017-08-19 NOTE — Progress Notes (Signed)
BP =  161/114, MAP = 130, HR = 108. Provider on cal notified

## 2017-08-19 NOTE — Consult Note (Signed)
[facility] for Infect[facility]    Date of Admission:  08/18/2017             Reason for Consult: VRE Bacteremia  Referring Provider: [employer] Primary Care Provider: Colon Branch, MD   Assessment/Plan:  Juan [name] presents with altered mental status with blood cultures positive for VRE and MRSE. It is likely the MRSE is a contaminent as it was found only in 1/4 bottles. He has a previous history of bladder issues resulting in potential re-current pseudomonal infection. It appears he may be colonized with pseudomonas and may have been resolved with previous treatment as his urine cultures since hospitalization are negative. Therefore it appears the meropenem is no longer need. Question source of infection. Given history of re-current infections, VRE would be a common bacterial cause of a UTI. If it was the old Foley Catheter, source control has been obtained with replacement.    1. Continue daptomycin for VRE infection. Discontinue meropenem. 2. Recheck blood cultures.  3. Continue foley catheter per urology recommendations.  4. We will follow along with you.    . Principal Problem:   Acute encephalopathy Active Problems:   Type II diabetes mellitus (HCC)   Sleep apnea, obstructive   History of Splenic infarct   History of recurrent Pseudomonas urinary tract infection   Moderate to severe pulmonary hypertension (HCC)   CKD (chronic kidney disease), stage III (HCC)   Chronic respiratory failure with hypoxia (HCC)   Chronic diastolic CHF (congestive heart failure), NYHA class 2 (HCC)   Myelodysplasia (myelodysplastic syndrome) (HCC)   HTN (hypertension)   . aspirin EC  81 mg Oral Daily  . B-complex with vitamin C  1 tablet Oral Daily  . darbepoetin ([name]) injection - NON-DIALYSIS  40 mcg Subcutaneous Q Wed-1800  . enoxaparin (LOVENOX) injection  40 mg Subcutaneous Q24H  . feeding supplement (ENSURE ENLIVE)  237 mL Oral BID BM  . ferrous gluconate  324 mg Oral  Daily  . hydrALAZINE  25 mg Oral BID  . tamsulosin  0.4 mg Oral QPC supper     HPI: Juan [name] is a 77 y.o. male with previous medical history of chronic urinary tract infection, diabetes, myeloperliferative disease, CKD III, and CAD presenting to the ED with confusion, lower abdominal pain, foul-smelling urine and nausea.  EMS found him in feces in his home as well as on the catheter. He had recently completed a 14-day course of IV antibiotics for multidrug-resistant Pseudomonas urinary tract infection with cefepime..  During previous admission psychiatry indicated he does not have the capacity to make decisions.  White blood cell count of 13.5, hemoglobin of 9.7, urinalysis with rare bacteria and trace leukocytes and negative for nitrites with a small amount of hematuria.  A chest x-ray was completed and showed mild interstitial and groundglass opacity within the bilateral lungs and a stable enlarged cardiomediastinal silhouette. Renal stone CT scan showed diffuse bladder wall thickening likely indicating cystitis with a large bladder diverticula. He was restarted on cefepime given altered mental status and previous multidrug-resistant urinary tract infection.  Blood cultures drawn were positive for VRE and coag negative Staphylococcus aureus in 1 of 4 bottles.  Urine culture obtained on 4/3 with no growth.  In the ED his temperature is 98.4.  His Foley catheter was exchanged for a new catheter.  He has remained afebrile since re-admission and leukocytosis improved now down to 7.4. Cefepime was changed to meropenem secondary to multiple courses of cefepime.  Daptomycin was added to cover for VRE.   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 Urology recommends keeping urethral catheter in place with potenital for further cystoscopy and urodynamics to better evaluate his situation when improved.    Review of Systems: Review of Systems    Constitutional: Negative for chills, fever, malaise/fatigue and weight loss.  Respiratory: Negative for cough, shortness of breath and wheezing.   Cardiovascular: Negative for chest pain and leg swelling.  Gastrointestinal: Negative for abdominal pain, nausea and vomiting.  Genitourinary: Negative for dysuria, flank pain, frequency and urgency.  Skin: Negative for rash.     Past Medical History:  Diagnosis Date  . Allergic rhinitis   . Anemia   . Ascending aortic aneurysm ([place]) 03/2014   4.3cm on CT scan  . CAD (coronary artery disease)    dx elsewheer in past, no documentation. Non-ischemic myoview 2007  . Chronic diastolic CHF (congestive heart failure), NYHA class 2 (HCC)    Normal EF w/ grade 1 dd by echo 12/2015  . Chronic respiratory failure with hypoxia/2L at home    "2L; all the time" (05/03/2017)  . CKD (chronic kidney disease), stage III ([place])   . Edema    R>L leg, u/s 5-12 neg for DVT  . Hemorrhoid   . History of hydronephrosis   . History of Splenic infarct   . History of thrombocytosis   . Hypertension   . Iron deficiency anemia due to chronic blood loss 03/31/2017  . 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 Migraine    "once/wk at least" (07/11/2013)  . Moderate to severe pulmonary hypertension ([place])   . Myeloproliferative disease ([place])   . Pyelonephritis 04/29/2017  . Recurrent Pseudomonas urinary tract infection   . Shortness of breath   . Sinus congestion   . Sleep apnea, obstructive    at some point used CPAP, was d/c  years ago  . Type II diabetes mellitus ([place])   . UTI (urinary tract infection) 06/2017  . UTI (urinary tract infection) 07/2017    Social History   Tobacco Use  . Smoking status: Former Smoker    Packs/day: 0.25    Years: 12.00    Pack years: 3.00    Types: Cigarettes    Last attempt to quit: 05/18/1966    Years since quitting: 51.2  . Smokeless tobacco: Never Used  . Tobacco comment: quit smoking 45 years ago  Substance Use Topics  .  Alcohol use: No    Alcohol/week: 0.0 oz  . Drug use: No    Family History  Problem Relation Age of Onset  . Schizophrenia Son   . Mental illness Daughter   . Colon cancer Neg Hx   . Prostate cancer Neg Hx   . Heart attack Neg Hx   . Diabetes Neg Hx     No Known Allergies  OBJECTIVE: Blood pressure (!) 161/114, pulse (!) 108, temperature 98.4 F (36.9 C), temperature source Oral, resp. rate 16, height 6\' 3"  (1.905 m), weight 181 lb 8 oz (82.3 kg), SpO2 96 %.  Physical Exam  Constitutional: No distress.  Lying in bed with head elevated eating lunch; pleasant. Continues to talk about missing clothes.   Cardiovascular: Normal rate, regular rhythm, normal heart sounds and intact distal pulses. Exam reveals no gallop and no friction rub.  No murmur heard. Pulmonary/Chest: Effort normal and breath sounds normal. No respiratory distress. He has no wheezes. He has no rales. He  exhibits no tenderness.  Abdominal: Soft. Bowel sounds are normal.  Neurological: He is alert.  Skin: Skin is warm and dry. No rash noted.  Psychiatric: Affect normal.    Lab Results Lab Results  Component Value Date   WBC 7.4 20-Oct-202019   HGB 8.9 (L) 20-Oct-202019   HCT 28.9 (L) 20-Oct-202019   MCV 87.8 20-Oct-202019   PLT 230 20-Oct-202019    Lab Results  Component Value Date   CREATININE 1.40 (H) 20-Oct-202019   BUN 44 (H) 20-Oct-202019   NA 141 20-Oct-202019   K 4.0 20-Oct-202019   CL 113 (H) 20-Oct-202019   CO2 21 (L) 20-Oct-202019    Lab Results  Component Value Date   ALT 12 (L) 20-Oct-202019   AST 17 20-Oct-202019   ALKPHOS 86 20-Oct-202019   BILITOT 0.5 20-Oct-202019     Microbiology: Recent Results (from the past 240 hour(s))  Blood culture (routine x 2)     Status: None (Preliminary result)   Collection Time: 08/18/17  2:30 AM  Result Value Ref Range Status   Specimen Description BLOOD LEFT ARM  Final   Special Requests   Final    BOTTLES DRAWN AEROBIC AND ANAEROBIC Blood Culture results may not be optimal due to an  excessive volume of blood received in culture bottles   Culture  Setup Time   Final    GRAM POSITIVE COCCI AEROBIC BOTTLE ONLY Organism ID to follow CRITICAL RESULT CALLED TO, READ BACK BY AND VERIFIED WITH: Karsten Ro [facility] 08/19/17 0138 JDW Performed at [facility], [address], [place], [place] [address]    Culture GRAM POSITIVE COCCI  Final   Report Status PENDING  Incomplete  Blood Culture ID Panel (Reflexed)     Status: Abnormal   Collection Time: 08/18/17  2:30 AM  Result Value Ref Range Status   Enterococcus species DETECTED (A) NOT DETECTED Final    Comment: CRITICAL RESULT CALLED TO, READ BACK BY AND VERIFIED WITH: J LEDFORD PHARMD 08/19/17 0138 JDW    Vancomycin resistance DETECTED (A) NOT DETECTED Final    Comment: CRITICAL RESULT CALLED TO, READ BACK BY AND VERIFIED WITH: J LEDFORD PHARMD 08/19/17 0138 JDW    Listeria monocytogenes NOT DETECTED NOT DETECTED Final   Staphylococcus species DETECTED (A) NOT DETECTED Final    Comment: Methicillin (oxacillin) resistant coagulase negative staphylococcus. Possible blood culture contaminant (unless isolated from more than one blood culture draw or clinical case suggests pathogenicity). No antibiotic treatment is indicated for blood  culture contaminants. CRITICAL RESULT CALLED TO, READ BACK BY AND VERIFIED WITH: J LEDFORD PHARMD 08/19/17 0138 JDW    Staphylococcus aureus NOT DETECTED NOT DETECTED Final   Methicillin resistance DETECTED (A) NOT DETECTED Final    Comment: CRITICAL RESULT CALLED TO, READ BACK BY AND VERIFIED WITH: J LEDFORD PHARMD 08/19/17 0138 JDW    Streptococcus species NOT DETECTED NOT DETECTED Final   Streptococcus agalactiae NOT DETECTED NOT DETECTED Final   Streptococcus pneumoniae NOT DETECTED NOT DETECTED Final   Streptococcus pyogenes NOT DETECTED NOT DETECTED Final   Acinetobacter baumannii NOT DETECTED NOT DETECTED Final   Enterobacteriaceae species NOT DETECTED NOT DETECTED Final   Enterobacter  cloacae complex NOT DETECTED NOT DETECTED Final   Escherichia coli NOT DETECTED NOT DETECTED Final   Klebsiella oxytoca NOT DETECTED NOT DETECTED Final   Klebsiella pneumoniae NOT DETECTED NOT DETECTED Final   Proteus species NOT DETECTED NOT DETECTED Final   Serratia marcescens NOT DETECTED NOT DETECTED Final  Haemophilus influenzae NOT DETECTED NOT DETECTED Final   Neisseria meningitidis NOT DETECTED NOT DETECTED Final   Pseudomonas aeruginosa NOT DETECTED NOT DETECTED Final   Candida albicans NOT DETECTED NOT DETECTED Final   Candida glabrata NOT DETECTED NOT DETECTED Final   Candida krusei NOT DETECTED NOT DETECTED Final   Candida parapsilosis NOT DETECTED NOT DETECTED Final   Candida tropicalis NOT DETECTED NOT DETECTED Final    Comment: Performed at [facility], [place] [address] [place], [place] [address]  Urine Culture     Status: None   Collection Time: 08/18/17  2:47 AM  Result Value Ref Range Status   Specimen Description URINE, CLEAN CATCH  Final   Special Requests NONE  Final   Culture   Final    NO GROWTH Performed at [facility], [place] [address] [place], [place] [address]    Report Status 2020-09-1417 FINAL  Final     Terri Piedra, NP [facility] [facility] [phone] Pager  08/19/2017  8:37 AM

## 2017-08-20 DIAGNOSIS — B957 Other staphylococcus as the cause of diseases classified elsewhere: Secondary | ICD-10-CM

## 2017-08-20 DIAGNOSIS — N329 Bladder disorder, unspecified: Secondary | ICD-10-CM

## 2017-08-20 DIAGNOSIS — A498 Other bacterial infections of unspecified site: Secondary | ICD-10-CM

## 2017-08-20 MED ORDER — DARBEPOETIN ALFA 40 MCG/0.4ML IJ SOSY: 40.0000 ug | PREFILLED_SYRINGE | INTRAMUSCULAR | Status: DC

## 2017-08-20 NOTE — Progress Notes (Signed)
Juan [name] for Infectious Disease  Date of Admission:  08/18/2017              ASSESSMENT/PLAN  Juan [name] appears stable and at baseline with his current treatment regimen of daptomycin for potential VRE bacteremia. This may certainly be a contaminant as well. Repeat blood cultures are pending. Based on current assessment it does not appear that he needs to be treated with long term therapy and may benefit from a 3-5 day course of antibiotics as there is no overwhelming evidence of infection. Unsure of his baseline mental status. If catheter was the source of infection, source control has been obtained.  1. Continue daptomycin through today and consider stopping tomorrow and observing without antibiotics. 2. Agree with continued follow up with urology for further evaluation of his bladder problems.  3. Continue foley catheter per urology.  4. We will continue to monitor cultures.    Principal Problem:   Acute encephalopathy Active Problems:   Type II diabetes mellitus (HCC)   Complicated UTI (urinary tract infection)   Sleep apnea, obstructive   History of Splenic infarct   History of recurrent Pseudomonas urinary tract infection   Moderate to severe pulmonary hypertension (HCC)   CKD (chronic kidney disease), stage III (HCC)   Chronic respiratory failure with hypoxia (HCC)   Chronic diastolic CHF (congestive heart failure), NYHA class 2 (HCC)   Myelodysplasia (myelodysplastic syndrome) (HCC)   HTN (hypertension)   . aspirin EC  81 mg Oral Daily  . B-complex with vitamin C  1 tablet Oral Daily  . darbepoetin ([name]) injection - NON-DIALYSIS  40 mcg Subcutaneous Q Wed-1800  . enoxaparin (LOVENOX) injection  40 mg Subcutaneous Q24H  . feeding supplement (ENSURE ENLIVE)  237 mL Oral BID BM  . ferrous gluconate  324 mg Oral Daily  . hydrALAZINE  25 mg Oral BID  . tamsulosin  0.4 mg Oral QPC supper    SUBJECTIVE:  Afebrile overnight with down trending leukocytosis.  Blood cultures drawn last evening and are pending. Denies any urinary symptoms. Continues to receive Daptomycin with no adverse side effects.  No Known Allergies   Review of Systems: Review of Systems  Constitutional: Negative for chills and fever.  Respiratory: Negative for cough.   Cardiovascular: Negative for chest pain.  Gastrointestinal: Negative for abdominal pain, constipation, diarrhea, nausea and vomiting.  Skin: Negative for rash.  Neurological: Negative for headaches.      OBJECTIVE: Vitals:   08/19/17 1636 08/19/17 2004 08/20/17 0445 08/20/17 0911  BP: (!) 160/74 (!) 156/86 (!) 155/74 (!) 159/73  Pulse: 62 78 60 75  Resp: 17 16  16   Temp: 98.2 F (36.8 C) 98.5 F (36.9 C) 98 F (36.7 C) 97.8 F (36.6 C)  TempSrc: Oral Oral Oral Oral  SpO2: 99% 99% 100% 100%  Weight:      Height:       Body mass index is 22.69 kg/m.  Physical Exam  Constitutional: He is well-developed, well-nourished, and in no distress. No distress.  Cardiovascular: Normal rate, regular rhythm, normal heart sounds and intact distal pulses. Exam reveals no gallop and no friction rub.  No murmur heard. Pulmonary/Chest: Effort normal and breath sounds normal. No respiratory distress. He has no wheezes. He has no rales. He exhibits no tenderness.  Abdominal: Soft. Bowel sounds are normal. He exhibits no distension. There is no tenderness.  Neurological: He is alert.  Skin: Skin is warm and dry. No rash noted.  Psychiatric: Affect  and judgment normal.    Lab Results Lab Results  Component Value Date   WBC 7.4 05-16-202019   HGB 8.9 (L) 05-16-202019   HCT 28.9 (L) 05-16-202019   MCV 87.8 05-16-202019   PLT 230 05-16-202019    Lab Results  Component Value Date   CREATININE 1.40 (H) 05-16-202019   BUN 44 (H) 05-16-202019   NA 141 05-16-202019   K 4.0 05-16-202019   CL 113 (H) 05-16-202019   CO2 21 (L) 05-16-202019    Lab Results  Component Value Date   ALT 12 (L) 05-16-202019   AST 17 05-16-202019    ALKPHOS 86 05-16-202019   BILITOT 0.5 05-16-202019     Microbiology: Recent Results (from the past 240 hour(s))  Blood culture (routine x 2)     Status: None (Preliminary result)   Collection Time: 08/18/17  2:30 AM  Result Value Ref Range Status   Specimen Description BLOOD LEFT ARM  Final   Special Requests   Final    BOTTLES DRAWN AEROBIC AND ANAEROBIC Blood Culture results may not be optimal due to an excessive volume of blood received in culture bottles   Culture  Setup Time   Final    GRAM POSITIVE COCCI AEROBIC BOTTLE ONLY CRITICAL RESULT CALLED TO, READ BACK BY AND VERIFIED WITH: Karsten Ro [facility] 08/19/17 0138 JDW Performed at [facility], [address], [place], [place] [address]    Culture GRAM POSITIVE COCCI  Final   Report Status PENDING  Incomplete  Blood Culture ID Panel (Reflexed)     Status: Abnormal   Collection Time: 08/18/17  2:30 AM  Result Value Ref Range Status   Enterococcus species DETECTED (A) NOT DETECTED Final    Comment: CRITICAL RESULT CALLED TO, READ BACK BY AND VERIFIED WITH: J LEDFORD PHARMD 08/19/17 0138 JDW    Vancomycin resistance DETECTED (A) NOT DETECTED Final    Comment: CRITICAL RESULT CALLED TO, READ BACK BY AND VERIFIED WITH: J LEDFORD PHARMD 08/19/17 0138 JDW    Listeria monocytogenes NOT DETECTED NOT DETECTED Final   Staphylococcus species DETECTED (A) NOT DETECTED Final    Comment: Methicillin (oxacillin) resistant coagulase negative staphylococcus. Possible blood culture contaminant (unless isolated from more than one blood culture draw or clinical case suggests pathogenicity). No antibiotic treatment is indicated for blood  culture contaminants. CRITICAL RESULT CALLED TO, READ BACK BY AND VERIFIED WITH: J LEDFORD PHARMD 08/19/17 0138 JDW    Staphylococcus aureus NOT DETECTED NOT DETECTED Final   Methicillin resistance DETECTED (A) NOT DETECTED Final    Comment: CRITICAL RESULT CALLED TO, READ BACK BY AND VERIFIED WITH: J LEDFORD  PHARMD 08/19/17 0138 JDW    Streptococcus species NOT DETECTED NOT DETECTED Final   Streptococcus agalactiae NOT DETECTED NOT DETECTED Final   Streptococcus pneumoniae NOT DETECTED NOT DETECTED Final   Streptococcus pyogenes NOT DETECTED NOT DETECTED Final   Acinetobacter baumannii NOT DETECTED NOT DETECTED Final   Enterobacteriaceae species NOT DETECTED NOT DETECTED Final   Enterobacter cloacae complex NOT DETECTED NOT DETECTED Final   Escherichia coli NOT DETECTED NOT DETECTED Final   Klebsiella oxytoca NOT DETECTED NOT DETECTED Final   Klebsiella pneumoniae NOT DETECTED NOT DETECTED Final   Proteus species NOT DETECTED NOT DETECTED Final   Serratia marcescens NOT DETECTED NOT DETECTED Final   Haemophilus influenzae NOT DETECTED NOT DETECTED Final   Neisseria meningitidis NOT DETECTED NOT DETECTED Final   Pseudomonas aeruginosa NOT DETECTED NOT DETECTED Final   Candida albicans NOT  DETECTED NOT DETECTED Final   Candida glabrata NOT DETECTED NOT DETECTED Final   Candida krusei NOT DETECTED NOT DETECTED Final   Candida parapsilosis NOT DETECTED NOT DETECTED Final   Candida tropicalis NOT DETECTED NOT DETECTED Final    Comment: Performed at [facility], [place] [address], [place], [place] [address]  Blood culture (routine x 2)     Status: None (Preliminary result)   Collection Time: 08/18/17  2:47 AM  Result Value Ref Range Status   Specimen Description BLOOD SITE NOT SPECIFIED  Final   Special Requests IN PEDIATRIC BOTTLE Blood Culture adequate volume  Final   Culture   Final    NO GROWTH 1 DAY Performed at [facility], [place] [address] [place], [place] [address]    Report Status PENDING  Incomplete  Urine Culture     Status: None   Collection Time: 08/18/17  2:47 AM  Result Value Ref Range Status   Specimen Description URINE, CLEAN CATCH  Final   Special Requests NONE  Final   Culture   Final    NO GROWTH Performed at [facility], [address],  [place], [place] [address]    Report Status 07-15-202019 FINAL  Final     Terri Piedra, NP [facility] [phone] Pager  08/20/2017  9:29 AM

## 2017-08-20 NOTE — Progress Notes (Addendum)
CSW following for discharge plan. Per chart review, patient is a recent readmit, with last full assessment completed on 07/16/17 (see below). Patient on recent admissions has refused SNF placement due to being in copay days, and would have to pay $170/day to admit to SNF.   CSW met with patient to discuss recommendation for SNF. Patient discussed how he would go, but it's all the "red tape" that he can't get around. CSW explained that he's in his copay days, and patient says he can't afford it until he gets his disability. CSW asked patient about when he submitted his disability application and patient said he hadn't. Patient became distracted during discussion and was asking for his daughter's phone number to call, which CSW provided from the face sheet. Patient also said how he has a new cell phone number but can't remember what it is and needs to call [employer] to figure it out. CSW asked permission to contact patient's daughter to discuss SNF placement and ability to pay copays or have support for the patient at home, and patient said that was fine.  CSW attempted to call patient's daughter. The number on the chart is not correct; brings an error message that the number is invalid. Per chart review, patient's son has health issues and cannot provide care for the patient even though the patient lives with him.  CSW will continue to follow.   Laveda Abbe, [place] Clinical Social Worker [phone]      Clinical Social Work Assessment  Patient Details  Name: Juan [name] MRN: [id number] Date of Birth: 1940/12/14  Date of referral:  07/16/17               Reason for consult:  Other (Comment Required)                           Permission sought to share information with:    Permission granted to share information::  No             Name::                   Agency::                Relationship::                Contact Information:     Housing/Transportation Living arrangements for the  past 2 months:  Apartment(with son ) Source of Information:  Patient Patient Interpreter Needed:  None Criminal Activity/Legal Involvement Pertinent to Current Situation/Hospitalization:  No - Comment as needed Significant Relationships:  Other Family Members Lives with:  Self, Adult Children Do you feel safe going back to the place where you live?  Yes Need for family participation in patient care:  Yes (Comment)  Care giving concerns:  CSW spoke with pt at bedside. At this time pt did not express any concerns to CSW.    Social Worker assessment / plan:  CSW spoke with pt at bedside, During this time CSW was informed that pt is from home where pt reports pt's son lives with pt. CSW was informed that pt does not want [place] services or placement as pt has support from son, grandson and daughter at this time.   Employment status:  Retired Forensic scientist:  Medicare PT Recommendations:  Not assessed at this time Information / Referral to community resources:     Patient/Family's Response to care:  Pt expressed understanding to plan of care  at this time.   Patient/Family's Understanding of and Emotional Response to Diagnosis, Current Treatment, and Prognosis:  No further questions or concerns have been presented to CSW at this time.   Emotional Assessment Appearance:  Appears stated age Attitude/Demeanor/Rapport:  Engaged Affect (typically observed):  Appropriate, Restless Orientation:  Oriented to Self, Oriented to Place, Oriented to  Time, Oriented to Situation Alcohol / Substance use:  Not Applicable Psych involvement (Current and /or in the community):  No (Comment)  Discharge Needs  Concerns to be addressed:  No discharge needs identified Readmission within the last 30 days:  No Current discharge risk:  None Barriers to Discharge:  Continued Medical Work up   [employer], [place] 07/16/2017, 10:23 AM

## 2017-08-20 NOTE — Progress Notes (Signed)
Patient ID: Juan [name], male   DOB: 10-08-1940, 77 y.o.   MRN: [id number]  PROGRESS NOTE    Beni Turrell  OEV:[id number] DOB: July 17, 1940 DOA: 08/18/2017   Outpatient Specialists: Urology   Brief Narrative: This is a 77year old male presenting again for admission to the hospital with possible urinary tract infection.  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  The patient does have a history of bilateral hydronephrosis, which resolved with catheter management.  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  He was discharged from the hospital 2 days ago with a Foley catheter in place.  The patient was readmitted today because of altered mental status.  EMS was contacted and called to the patient's home where he was found lying in feces.  He is brought to the emergency room for further management. He has been seen by urology at this point recommendation is to continue with Foley in place patient is consistent facility   Assessment & Plan:   Principal Problem:   Acute encephalopathy Active Problems:   Type II diabetes mellitus ([facility])   Complicated UTI (urinary tract infection)   Sleep apnea, obstructive   History of Splenic infarct   History of recurrent Pseudomonas urinary tract infection   Moderate to severe pulmonary hypertension (HCC)   CKD (chronic kidney disease), stage III (HCC)   Chronic respiratory failure with hypoxia (HCC)   Chronic diastolic CHF (congestive heart failure), NYHA class 2 (HCC)   Myelodysplasia (myelodysplastic syndrome) (HCC)   HTN (hypertension)   VRE (vancomycin resistant enterococcus) culture positive   Staphylococcus epidermidis infection   #1 Acute Encephalopathy: Much improved today. Patient probably has baseline dementia or just acute delirium. He is stable at the moment.   #2 urinary retention; This is chronic and persistent. Patient has  hydronephrosis and recurrent UTI. Continue according to urology with Foley catheter. Continue Flomax  #3 recurrent UTI: Pseudomonas infection resistant to ciprofloxacin. This seems to be under control on daptomycin until tomorrow. DC abx then and observe per ID.  #4 diabetes:questionable due to normal A1c. Continue monitoring blood sugar and if elevated will initiate sliding scale insulin.  #5 chronic kidney disease stage III: Seems to be admitted baseline  #5 myelodysplasia: On erythropoietin and follow-up with oncology  #6 obstructive sleep apnea: Patient needs to be more compliant with CPAP.  #7 hypertension: Essential hypertension which is controlled  #8 chronic diastolic CHF: Appears to be compensated  #9 disposition: Skilled Nursing placement   DVT prophylaxis: Lovenox Code Status: Full Family Communication: No one available Disposition Plan: SNF  Consultants:   Dr Eulogio Ditch, Urology  Procedures:  -None  Antimicrobials:  -Daptomycin  Subjective: Patient ismore awake and alert today but weak.  No fever or chills.   Objective: Vitals:   08/20/17 0445 08/20/17 0911 08/20/17 1255 08/20/17 1746  BP: (!) 155/74 (!) 159/73 (!) 155/78 (!) 147/71  Pulse: 60 75 79 74  Resp:  16 18 16   Temp: 98 F (36.7 C) 97.8 F (36.6 C) 98 F (36.7 C) 97.9 F (36.6 C)  TempSrc: Oral Oral Oral Oral  SpO2: 100% 100% 100% 100%  Weight:      Height:        Intake/Output Summary (Last 24 hours) at 08/20/2017 1854 Last data filed at 08/20/2017 1045 Gross per 24 hour  Intake -  Output 600 ml  Net -600 ml   [employer]  08/18/17 0145 08/19/17 0445  Weight: 80.7 kg (178 lb) 82.3 kg (181 lb 8 oz)    Examination:  General exam: Appears calm and comfortable  Respiratory system: Clear to auscultation. Respiratory effort normal. Cardiovascular system: S1 & S2 heard, RRR. No JVD, murmurs, rubs, gallops or clicks. No pedal edema. Gastrointestinal system: Abdomen is nondistended,  soft and nontender. No organomegaly or masses felt. Normal bowel sounds heard. Central nervous system: Alert and oriented. No focal neurological deficits. Extremities: Symmetric 5 x 5 power. Skin: No rashes, lesions or ulcers Psychiatry: Judgement and insight appear normal. Mood & affect appropriate.     Data Reviewed: I have personally reviewed following labs and imaging studies  CBC: Recent Labs  Lab 08/18/17 0247 08/19/17 0447  WBC 13.5* 7.4  NEUTROABS 8.6*  --   HGB 9.7* 8.9*  HCT 31.4* 28.9*  MCV 87.2 87.8  PLT 314 409   Basic Metabolic Panel: Recent Labs  Lab 08/18/17 0247 08/19/17 0447  NA 145 141  K 4.5 4.0  CL 114* 113*  CO2 19* 21*  GLUCOSE 89 102*  BUN 48* 44*  CREATININE 1.51* 1.40*  CALCIUM 9.8 9.1   GFR: Estimated Creatinine Clearance: 53.1 mL/min (A) (by C-G formula based on SCr of 1.4 mg/dL (H)). Liver Function Tests: Recent Labs  Lab 08/18/17 0247 08/19/17 0447  AST 23 17  ALT 14* 12*  ALKPHOS 100 86  BILITOT 0.8 0.5  PROT 5.7* 5.1*  ALBUMIN 2.8* 2.4*   No results for input(s): LIPASE, AMYLASE in the last 168 hours. No results for input(s): AMMONIA in the last 168 hours. Coagulation Profile: No results for input(s): INR, PROTIME in the last 168 hours. Cardiac Enzymes: Recent Labs  Lab 08/19/17 0447  CKTOTAL 22*   BNP (last 3 results) No results for input(s): PROBNP in the last 8760 hours. HbA1C: Recent Labs    08/18/17 0247  HGBA1C 5.1   CBG: Recent Labs  Lab 08/15/17 1234 08/15/17 1712 08/15/17 2128 08/16/17 0900 08/18/17 0945  GLUCAP 104* 102* 131* 101* 70   Lipid Profile: No results for input(s): CHOL, HDL, LDLCALC, TRIG, CHOLHDL, LDLDIRECT in the last 72 hours. Thyroid Function Tests: No results for input(s): TSH, T4TOTAL, FREET4, T3FREE, THYROIDAB in the last 72 hours. Anemia Panel: No results for input(s): VITAMINB12, FOLATE, FERRITIN, TIBC, IRON, RETICCTPCT in the last 72 hours. Urine analysis:    Component  Value Date/Time   COLORURINE YELLOW 08/18/2017 [place] 08/18/2017 0247   LABSPEC 1.011 08/18/2017 0247   PHURINE 6.0 08/18/2017 0247   GLUCOSEU NEGATIVE 08/18/2017 0247   GLUCOSEU NEGATIVE 05/02/2015 1008   HGBUR SMALL (A) 08/18/2017 0247   BILIRUBINUR NEGATIVE 08/18/2017 0247   KETONESUR NEGATIVE 08/18/2017 0247   PROTEINUR >=300 (A) 08/18/2017 0247   UROBILINOGEN 0.2 05/02/2015 1008   NITRITE NEGATIVE 08/18/2017 0247   LEUKOCYTESUR TRACE (A) 08/18/2017 0247   Sepsis Labs: @LABRCNTIP (procalcitonin:4,lacticidven:4)  ) Recent Results (from the past 240 hour(s))  Blood culture (routine x 2)     Status: Abnormal (Preliminary result)   Collection Time: 08/18/17  2:30 AM  Result Value Ref Range Status   Specimen Description BLOOD LEFT ARM  Final   Special Requests   Final    BOTTLES DRAWN AEROBIC AND ANAEROBIC Blood Culture results may not be optimal due to an excessive volume of blood received in culture bottles   Culture  Setup Time   Final    GRAM POSITIVE COCCI AEROBIC BOTTLE ONLY CRITICAL RESULT CALLED TO,  READ BACK BY AND VERIFIED WITH: Karsten Ro [facility] 08/19/17 0138 JDW Performed at [facility], [place] [address] [place], [place] [address]    Culture (A)  Final    ENTEROCOCCUS FAECIUM SUSCEPTIBILITIES TO FOLLOW STAPHYLOCOCCUS SPECIES (COAGULASE NEGATIVE)    Report Status PENDING  Incomplete  Blood Culture ID Panel (Reflexed)     Status: Abnormal   Collection Time: 08/18/17  2:30 AM  Result Value Ref Range Status   Enterococcus species DETECTED (A) NOT DETECTED Final    Comment: CRITICAL RESULT CALLED TO, READ BACK BY AND VERIFIED WITH: J LEDFORD PHARMD 08/19/17 0138 JDW    Vancomycin resistance DETECTED (A) NOT DETECTED Final    Comment: CRITICAL RESULT CALLED TO, READ BACK BY AND VERIFIED WITH: J LEDFORD PHARMD 08/19/17 0138 JDW    Listeria monocytogenes NOT DETECTED NOT DETECTED Final   Staphylococcus species DETECTED (A) NOT DETECTED Final     Comment: Methicillin (oxacillin) resistant coagulase negative staphylococcus. Possible blood culture contaminant (unless isolated from more than one blood culture draw or clinical case suggests pathogenicity). No antibiotic treatment is indicated for blood  culture contaminants. CRITICAL RESULT CALLED TO, READ BACK BY AND VERIFIED WITH: J LEDFORD PHARMD 08/19/17 0138 JDW    Staphylococcus aureus NOT DETECTED NOT DETECTED Final   Methicillin resistance DETECTED (A) NOT DETECTED Final    Comment: CRITICAL RESULT CALLED TO, READ BACK BY AND VERIFIED WITH: J LEDFORD PHARMD 08/19/17 0138 JDW    Streptococcus species NOT DETECTED NOT DETECTED Final   Streptococcus agalactiae NOT DETECTED NOT DETECTED Final   Streptococcus pneumoniae NOT DETECTED NOT DETECTED Final   Streptococcus pyogenes NOT DETECTED NOT DETECTED Final   Acinetobacter baumannii NOT DETECTED NOT DETECTED Final   Enterobacteriaceae species NOT DETECTED NOT DETECTED Final   Enterobacter cloacae complex NOT DETECTED NOT DETECTED Final   Escherichia coli NOT DETECTED NOT DETECTED Final   Klebsiella oxytoca NOT DETECTED NOT DETECTED Final   Klebsiella pneumoniae NOT DETECTED NOT DETECTED Final   Proteus species NOT DETECTED NOT DETECTED Final   Serratia marcescens NOT DETECTED NOT DETECTED Final   Haemophilus influenzae NOT DETECTED NOT DETECTED Final   Neisseria meningitidis NOT DETECTED NOT DETECTED Final   Pseudomonas aeruginosa NOT DETECTED NOT DETECTED Final   Candida albicans NOT DETECTED NOT DETECTED Final   Candida glabrata NOT DETECTED NOT DETECTED Final   Candida krusei NOT DETECTED NOT DETECTED Final   Candida parapsilosis NOT DETECTED NOT DETECTED Final   Candida tropicalis NOT DETECTED NOT DETECTED Final    Comment: Performed at [facility], [place], [place], [place] [address]  Blood culture (routine x 2)     Status: None (Preliminary result)   Collection Time: 08/18/17  2:47 AM  Result Value Ref Range  Status   Specimen Description BLOOD SITE NOT SPECIFIED  Final   Special Requests IN PEDIATRIC BOTTLE Blood Culture adequate volume  Final   Culture   Final    NO GROWTH 2 DAYS Performed at [facility], [place] [address], [place], [place] [address]    Report Status PENDING  Incomplete  Urine Culture     Status: None   Collection Time: 08/18/17  2:47 AM  Result Value Ref Range Status   Specimen Description URINE, CLEAN CATCH  Final   Special Requests NONE  Final   Culture   Final    NO GROWTH Performed at [facility], [address], [place], [place] [address]  Report Status January 14, 202019 FINAL  Final  Culture, blood (routine x 2)     Status: None (Preliminary result)   Collection Time: 08/19/17 10:12 PM  Result Value Ref Range Status   Specimen Description BLOOD RIGHT HAND  Final   Special Requests AEROBIC BOTTLE ONLY Blood Culture adequate volume  Final   Culture   Final    NO GROWTH < 12 HOURS Performed at [facility], [place] [address], [place], [place] [address]    Report Status PENDING  Incomplete  Culture, blood (routine x 2)     Status: None (Preliminary result)   Collection Time: 08/19/17 10:12 PM  Result Value Ref Range Status   Specimen Description BLOOD LEFT ANTECUBITAL  Final   Special Requests AEROBIC BOTTLE ONLY Blood Culture adequate volume  Final   Culture   Final    NO GROWTH < 12 HOURS Performed at [facility], [place] [address], [place], [place] [address]    Report Status PENDING  Incomplete         Radiology Studies: No results found.      Scheduled Meds: . aspirin EC  81 mg Oral Daily  . B-complex with vitamin C  1 tablet Oral Daily  . [START ON 08/26/2017] darbepoetin ([name]) injection - NON-DIALYSIS  40 mcg Subcutaneous Q Thu-1800  . enoxaparin (LOVENOX) injection  40 mg Subcutaneous Q24H  . feeding supplement (ENSURE ENLIVE)  237 mL Oral BID BM  . ferrous gluconate  324 mg Oral Daily  . hydrALAZINE  25 mg Oral BID  .  tamsulosin  0.4 mg Oral QPC supper   Continuous Infusions: . DAPTOmycin (CUBICIN)  IV Stopped (08/20/17 0220)     LOS: 2 days    Time spent: 94 minutes    GARBA,LAWAL, MD [facility] Pager [phone]  If 7PM-7AM, please contact night-coverage [email or web address] Password [facility] 08/20/2017, 6:54 PM

## 2017-08-21 LAB — CULTURE, BLOOD (ROUTINE X 2)

## 2017-08-21 MED ORDER — HALOPERIDOL LACTATE 5 MG/ML IJ SOLN: 1.0000 mg | Freq: Once | INTRAMUSCULAR | Status: DC

## 2017-08-21 NOTE — Progress Notes (Signed)
Patient ID: Juan [name], male   DOB: 1940/06/09, 77 y.o.   MRN: [id number]                                                                PROGRESS NOTE                                                                                                                                                                                                             Patient Demographics:    Juan [name], is a 77 y.o. male, DOB - 1940-09-29, IDP:[id number]  Admit date - 08/18/2017   Admitting Physician Juan Bongo, MD  Outpatient Primary MD for the patient is Juan Branch, MD  LOS - 3  Outpatient Specialists:    Chief Complaint  Patient presents with  . Urinary Tract Infection       Brief Narrative    77 y.o. 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  Patient has been admitted 6 times since November 2018 with issues either related to recurrent pseudomonal UTI and one hospitalization was secondary to GI bleeding in January 2019.  Patient reportedly has a urologist in [facility] but has never followed up during this timeframe.  He was most recently discharged on 4/1 after another admission for UTI and hydroureter.  Foley catheter was placed.  Patient was treated with cefepime for 14 days.  Hospitalization was protracted so patient could receive IV antibiotics since he refused a PICC line.  Because of this psychiatry was consulted and deemed the patient lacked capacity to make appropriate medical decisions.  Patient lives with schizophrenic son who he  describes as only being able to heat up food that has been previously prepared and sometimes performing laundry duties.  Patient has daughter who he describes as "working 2 jobs" and therefore she is unable to assist with care other than apparently bringing meals by per patient report.  He has been placed in  SNF back in November for apparent rehab and now refuses to return to SNF because of need to perform PT/OT yet states he wants to improve and go home.    EMS was called to the patient's home because of altered mentation.  Patient apparently was complaining of lower abdominal/bladder pain.  He was noted to be covered in feces (coating the Foley catheter) and smelling of urine.  Patient was normotensive and afebrile upon presentation but bradycardic with heart rates between 45 and 57 bpm.  He has been given a liter of IV fluids and given a dose of cefepime IV.  WBC of 13,500 without left shift, albumin 2.8, urinalysis not consistent with UTI.  Blood cultures and urine cultures obtained prior to administration of antibiotics.  ED Course:  Vital Signs: BP (!) 175/58   Pulse (!) 53   Temp 98.4 F (36.9 C) (Rectal)   Resp 16   Ht 6\' 3"  (1.905 m)   Wt 80.7 kg (178 lb)   SpO2 100%   BMI 22.25 kg/m  CXR: Stable interstitial and groundglass opacities in the bilateral lungs. CT renal stone study: Diffuse bladder wall thickening likely indicating cystitis.  Large bladder diverticula.  No hydronephrosis or hydroureter.  No obstructing stones.  Diffuse edema throughout the subcutaneous and mesenteric fat without loculated fluid collections. Pertinent lab data: Potassium 4.5, chloride 114, CO2 19, BUN 48, creatinine 1.51 8, lactic acid 0.58, white count 13,500 with neutrophils 64% neutrophils 8.6%, hemoglobin 9.7, platelets 314,000, urinalysis with small amount of hemoglobin, trace leukocytes, protein greater than 300, rare bacteria, blood cultures and urine culture obtained in the ER Medications and  treatments: Normal saline bolus and antibiotic as above     Subjective:    Juan [name] today is axox3 (person, place, time).  Pt denies dysuria, hematuria, flank pain, fever, chills.  Pt receiving last dose of daptomycin this am.    No headache, No chest pain, No abdominal pain - No Nausea, No new weakness tingling or numbness, No Cough - SOB.   Assessment  & Plan :    Principal Problem:   Acute encephalopathy Active Problems:   Type II diabetes mellitus (HCC)   Complicated UTI (urinary tract infection)   Sleep apnea, obstructive   History of Splenic infarct   History of recurrent Pseudomonas urinary tract infection   Moderate to severe pulmonary hypertension (HCC)   CKD (chronic kidney disease), stage III (HCC)   Chronic respiratory failure with hypoxia (HCC)   Chronic diastolic CHF (congestive heart failure), NYHA class 2 (HCC)   Myelodysplasia (myelodysplastic syndrome) (HCC)   HTN (hypertension)   VRE (vancomycin resistant enterococcus) culture positive   Staphylococcus epidermidis infection    #1 Acute Encephalopathy: Much improved today. Patient probably has baseline dementia or just acute delirium. He is stable at the moment.   #2 urinary retention; This is chronic and persistent. Patient has hydronephrosis and recurrent UTI. Continue according to urology with Foley catheter. Continue Flomax  #3 recurrent UTI: Pseudomonas infection resistant to ciprofloxacin. This seems to be under control on daptomycin until 4/6   DC abx (daptomycin) 4/6  and observe per ID, will observe today    #4 diabetes:questionable due to normal A1c. Continue monitoring blood sugar and if elevated will initiate sliding scale insulin.  #5 chronic kidney disease stage III: Seems to be admitted baseline  #5 myelodysplasia: On erythropoietin and follow-up with oncology  #6 obstructive sleep apnea: Patient needs to be more compliant with CPAP.  #7 hypertension:  Essential hypertension which  is controlled  #8 chronic diastolic CHF: Appears to be compensated  #9 disposition: Skilled Nursing placement      Code Status : FULL CODE  Family Communication  : attempted to contact daughter, her number is wrong in the computer,  That number has been disconnected.   Disposition Plan  :   SNF, if doing well off Daptomycin  Barriers For Discharge :   Consults  :  Urology Dr. Eulogio Ditch  Procedures  :      DVT Prophylaxis  :  Lovenox - SCDs   Lab Results  Component Value Date   PLT 230 October 02, 202019    Antibiotics  :  Meropenem, cefepime 4/3,  daptomycin 4/4=>    Anti-infectives (From admission, onward)   Start     Dose/Rate Route Frequency Ordered Stop   08/19/17 0230  DAPTOmycin (CUBICIN) 650 mg in sodium chloride 0.9 % IVPB     650 mg 226 mL/hr over 30 Minutes Intravenous Every 24 hours 08/19/17 0213     08/18/17 1800  ceFEPIme (MAXIPIME) 1 g in sodium chloride 0.9 % 100 mL IVPB  Status:  Discontinued     1 g 200 mL/hr over 30 Minutes Intravenous Every 12 hours 08/18/17 0603 08/18/17 0759   08/18/17 0815  meropenem (MERREM) 1 g in sodium chloride 0.9 % 100 mL IVPB  Status:  Discontinued     1 g 200 mL/hr over 30 Minutes Intravenous Every 12 hours 08/18/17 0810 08/19/17 1153   08/18/17 0530  ceFEPIme (MAXIPIME) 2 g in sodium chloride 0.9 % 100 mL IVPB     2 g 200 mL/hr over 30 Minutes Intravenous  Once 08/18/17 0524 08/18/17 0654        Objective:   Vitals:   08/20/17 1255 08/20/17 1746 08/20/17 2200 08/21/17 0003  BP: (!) 155/78 (!) 147/71 (!) 159/79 (!) 144/78  Pulse: 79 74 66 70  Resp: 18 16  18   Temp: 98 F (36.7 C) 97.9 F (36.6 C)  98.4 F (36.9 C)  TempSrc: Oral Oral  Oral  SpO2: 100% 100% 98% 97%  Weight:      Height:        Wt Readings from Last 3 Encounters:  08/19/17 82.3 kg (181 lb 8 oz)  08/04/17 81.1 kg (178 lb 12.7 oz)  07/16/17 86.5 kg (190 lb 11.2 oz)     Intake/Output Summary (Last 24 hours) at 08/21/2017 0740 Last data filed  at 08/21/2017 0500 Gross per 24 hour  Intake 960 ml  Output 600 ml  Net 360 ml     Physical Exam  Awake Alert, Oriented X 3, No new F.N deficits, Normal affect .AT,PERRAL Supple Neck,No JVD, No cervical lymphadenopathy appriciated.  Symmetrical Chest wall movement, Good air movement bilaterally, CTAB RRR,No Gallops,Rubs or new Murmurs, No Parasternal Heave +ve B.Sounds, Abd Soft, No tenderness, No organomegaly appriciated, No rebound - guarding or rigidity. No Cyanosis, Clubbing or edema, No new Rash or bruise   Pt eating breakfast well this am    Data Review:    CBC Recent Labs  Lab 08/18/17 0247 08/19/17 0447  WBC 13.5* 7.4  HGB 9.7* 8.9*  HCT 31.4* 28.9*  PLT 314 230  MCV 87.2 87.8  MCH 26.9 27.1  MCHC 30.9 30.8  RDW 17.2* 17.3*  LYMPHSABS 2.6  --   MONOABS 1.5*  --   EOSABS 0.3  --   BASOSABS 0.5*  --     Chemistries  Recent Labs  Lab 08/18/17 0247 08/19/17 0447  NA 145 141  K 4.5 4.0  CL 114* 113*  CO2 19* 21*  GLUCOSE 89 102*  BUN 48* 44*  CREATININE 1.51* 1.40*  CALCIUM 9.8 9.1  AST 23 17  ALT 14* 12*  ALKPHOS 100 86  BILITOT 0.8 0.5   ------------------------------------------------------------------------------------------------------------------ No results for input(s): CHOL, HDL, LDLCALC, TRIG, CHOLHDL, LDLDIRECT in the last 72 hours.  Lab Results  Component Value Date   HGBA1C 5.1 08/18/2017   ------------------------------------------------------------------------------------------------------------------ No results for input(s): TSH, T4TOTAL, T3FREE, THYROIDAB in the last 72 hours.  Invalid input(s): FREET3 ------------------------------------------------------------------------------------------------------------------ No results for input(s): VITAMINB12, FOLATE, FERRITIN, TIBC, IRON, RETICCTPCT in the last 72 hours.  Coagulation profile No results for input(s): INR, PROTIME in the last 168 hours.  No results for input(s):  DDIMER in the last 72 hours.  Cardiac Enzymes No results for input(s): CKMB, TROPONINI, MYOGLOBIN in the last 168 hours.  Invalid input(s): CK ------------------------------------------------------------------------------------------------------------------    Component Value Date/Time   BNP 499.9 (H) 07/16/2017 0558    Inpatient Medications  Scheduled Meds: . aspirin EC  81 mg Oral Daily  . B-complex with vitamin C  1 tablet Oral Daily  . [START ON 08/26/2017] darbepoetin ([name]) injection - NON-DIALYSIS  40 mcg Subcutaneous Q Thu-1800  . enoxaparin (LOVENOX) injection  40 mg Subcutaneous Q24H  . feeding supplement (ENSURE ENLIVE)  237 mL Oral BID BM  . ferrous gluconate  324 mg Oral Daily  . haloperidol lactate  1 mg Intravenous Once  . hydrALAZINE  25 mg Oral BID  . tamsulosin  0.4 mg Oral QPC supper   Continuous Infusions: . DAPTOmycin (CUBICIN)  IV 650 mg (08/21/17 0613)   PRN Meds:.acetaminophen **OR** acetaminophen, ondansetron **OR** ondansetron (ZOFRAN) IV, sodium chloride  Micro Results Recent Results (from the past 240 hour(s))  Blood culture (routine x 2)     Status: Abnormal (Preliminary result)   Collection Time: 08/18/17  2:30 AM  Result Value Ref Range Status   Specimen Description BLOOD LEFT ARM  Final   Special Requests   Final    BOTTLES DRAWN AEROBIC AND ANAEROBIC Blood Culture results may not be optimal due to an excessive volume of blood received in culture bottles   Culture  Setup Time   Final    GRAM POSITIVE COCCI AEROBIC BOTTLE ONLY CRITICAL RESULT CALLED TO, READ BACK BY AND VERIFIED WITH: J [facility] 08/19/17 0138 JDW Performed at [facility], [address], [place], [place] [address]    Culture (A)  Final    ENTEROCOCCUS FAECIUM SUSCEPTIBILITIES TO FOLLOW STAPHYLOCOCCUS SPECIES (COAGULASE NEGATIVE)    Report Status PENDING  Incomplete  Blood Culture ID Panel (Reflexed)     Status: Abnormal   Collection Time: 08/18/17  2:30 AM   Result Value Ref Range Status   Enterococcus species DETECTED (A) NOT DETECTED Final    Comment: CRITICAL RESULT CALLED TO, READ BACK BY AND VERIFIED WITH: J [facility] PHARMD 08/19/17 0138 JDW    Vancomycin resistance DETECTED (A) NOT DETECTED Final    Comment: CRITICAL RESULT CALLED TO, READ BACK BY AND VERIFIED WITH: J LEDFORD PHARMD 08/19/17 0138 JDW    Listeria monocytogenes NOT DETECTED NOT DETECTED Final   Staphylococcus species DETECTED (A) NOT DETECTED Final    Comment: Methicillin (oxacillin) resistant coagulase negative staphylococcus. Possible blood culture contaminant (unless isolated from more than one blood culture draw or clinical case suggests pathogenicity). No antibiotic treatment is indicated for blood  culture  contaminants. CRITICAL RESULT CALLED TO, READ BACK BY AND VERIFIED WITH: J LEDFORD PHARMD 08/19/17 0138 JDW    Staphylococcus aureus NOT DETECTED NOT DETECTED Final   Methicillin resistance DETECTED (A) NOT DETECTED Final    Comment: CRITICAL RESULT CALLED TO, READ BACK BY AND VERIFIED WITH: J LEDFORD PHARMD 08/19/17 0138 JDW    Streptococcus species NOT DETECTED NOT DETECTED Final   Streptococcus agalactiae NOT DETECTED NOT DETECTED Final   Streptococcus pneumoniae NOT DETECTED NOT DETECTED Final   Streptococcus pyogenes NOT DETECTED NOT DETECTED Final   Acinetobacter baumannii NOT DETECTED NOT DETECTED Final   Enterobacteriaceae species NOT DETECTED NOT DETECTED Final   Enterobacter cloacae complex NOT DETECTED NOT DETECTED Final   Escherichia coli NOT DETECTED NOT DETECTED Final   Klebsiella oxytoca NOT DETECTED NOT DETECTED Final   Klebsiella pneumoniae NOT DETECTED NOT DETECTED Final   Proteus species NOT DETECTED NOT DETECTED Final   Serratia marcescens NOT DETECTED NOT DETECTED Final   Haemophilus influenzae NOT DETECTED NOT DETECTED Final   Neisseria meningitidis NOT DETECTED NOT DETECTED Final   Pseudomonas aeruginosa NOT DETECTED NOT DETECTED Final    Candida albicans NOT DETECTED NOT DETECTED Final   Candida glabrata NOT DETECTED NOT DETECTED Final   Candida krusei NOT DETECTED NOT DETECTED Final   Candida parapsilosis NOT DETECTED NOT DETECTED Final   Candida tropicalis NOT DETECTED NOT DETECTED Final    Comment: Performed at [facility], [place]. [address], [place], [place] [address]  Blood culture (routine x 2)     Status: None (Preliminary result)   Collection Time: 08/18/17  2:47 AM  Result Value Ref Range Status   Specimen Description BLOOD SITE NOT SPECIFIED  Final   Special Requests IN PEDIATRIC BOTTLE Blood Culture adequate volume  Final   Culture   Final    NO GROWTH 2 DAYS Performed at [facility], [place] [address] [place], [place] [address]    Report Status PENDING  Incomplete  Urine Culture     Status: None   Collection Time: 08/18/17  2:47 AM  Result Value Ref Range Status   Specimen Description URINE, CLEAN CATCH  Final   Special Requests NONE  Final   Culture   Final    NO GROWTH Performed at [facility], [address], [place], [place] [address]    Report Status 06/12/2017 FINAL  Final  Culture, blood (routine x 2)     Status: None (Preliminary result)   Collection Time: 08/19/17 10:12 PM  Result Value Ref Range Status   Specimen Description BLOOD RIGHT HAND  Final   Special Requests AEROBIC BOTTLE ONLY Blood Culture adequate volume  Final   Culture   Final    NO GROWTH < 12 HOURS Performed at [facility], [place] [address] [place], [place] [address]    Report Status PENDING  Incomplete  Culture, blood (routine x 2)     Status: None (Preliminary result)   Collection Time: 08/19/17 10:12 PM  Result Value Ref Range Status   Specimen Description BLOOD LEFT ANTECUBITAL  Final   Special Requests AEROBIC BOTTLE ONLY Blood Culture adequate volume  Final   Culture   Final    NO GROWTH < 12 HOURS Performed at [facility], [place] [address] [name], [place] [address]    Report Status  PENDING  Incomplete    Radiology Reports Ct Head Wo Contrast  Result Date: 08/18/2017 CLINICAL DATA:  Altered mental  status EXAM: CT HEAD WITHOUT CONTRAST TECHNIQUE: Contiguous axial images were obtained from the base of the skull through the vertex without intravenous contrast. COMPARISON:  None. FINDINGS: Brain: No evidence of acute infarction, hemorrhage, extra-axial collection, ventriculomegaly, or mass effect. Generalized cerebral atrophy. Periventricular white matter low attenuation likely secondary to microangiopathy. Vascular: No hyperdense vessel. Skull: Negative for fracture or focal lesion. Sinuses/Orbits: Visualized portions of the orbits are unremarkable. Visualized portions of the paranasal sinuses and mastoid air cells are unremarkable. Other: None. IMPRESSION: No acute intracranial pathology. Electronically Signed   By: Kathreen Devoid   On: 08/18/2017 11:48   Ct Abdomen Pelvis W Contrast  Result Date: 08/03/2017 CLINICAL DATA:  Lower abdominal pain. EXAM: CT ABDOMEN AND PELVIS WITH CONTRAST TECHNIQUE: Multidetector CT imaging of the abdomen and pelvis was performed using the standard protocol following bolus administration of intravenous contrast. CONTRAST:  1104mL ISOVUE-300 IOPAMIDOL (ISOVUE-300) INJECTION 61% COMPARISON:  06/07/2017 FINDINGS: Lower chest: Motion artifact and subsegmental atelectasis in the lung bases. No pleural effusion. Coronary artery atherosclerosis. Unchanged dilatation of the main pulmonary artery to 5 cm diameter as can be seen with pulmonary arterial hypertension. Hepatobiliary: No focal liver abnormality. Moderately distended gallbladder with possible punctate stone but no gallbladder wall thickening or other acute inflammatory changes. No biliary dilatation. Pancreas: Unremarkable. Spleen: Unchanged splenomegaly. Apparent decreased size of peripheral wedge-shaped hypodensity suggesting evolving infarct. Adrenals/Urinary Tract: Unremarkable adrenal glands. Unchanged  2.7 cm right lower pole renal cyst. Mild to moderate right hydroureteronephrosis has mildly improved. Left hydronephrosis on the prior study has resolved. Distended, thick-walled bladder containing multiple diverticula is similar to the prior study. Stomach/Bowel: The stomach is within normal limits. No bowel obstruction. Extensive descending and sigmoid Juan diverticulosis without significant acute inflammatory changes identified within limitations of motion artifact. Scattered locules scratched at scattered small locules of gas in the right mid to upper abdomen are favored to be within decompressed small bowel loops rather than extraluminal. The appendix is not well visualized, with motion artifact limiting assessment. No inflammatory changes are seen in the right lower quadrant to suggest acute appendicitis. Vascular/Lymphatic: Abdominal aortic atherosclerosis without aneurysm. No enlarged lymph nodes. Reproductive: At most mild prostatic enlargement, unchanged. Other: No intraperitoneal free fluid. Unchanged 1.8 cm subcutaneous soft tissue nodule in the left inguinal region. Mild diffuse body wall edema. Musculoskeletal: Diffuse heterogeneous sclerosis throughout the visualized osseous structures, unchanged. IMPRESSION: 1. No acute abnormality identified in the abdomen or pelvis. 2. Mildly improved right hydroureteronephrosis. Resolved left hydronephrosis. 3. Unchanged distension of a thick-walled urinary bladder containing multiple diverticula. 4. Unchanged splenomegaly with evolving infarct as previously seen. 5. Unchanged diffusely abnormal appearance of the bones consistent with known myeloproliferative disorder. 6. Colonic diverticulosis. 7.  Aortic Atherosclerosis (ICD10-I70.0). Electronically Signed   By: Logan Bores M.D.   On: 08/03/2017 17:49   Dg Chest Portable 1 View  Result Date: 08/18/2017 CLINICAL DATA:  Altered mental status EXAM: PORTABLE CHEST 1 VIEW COMPARISON:  07/16/2017, 07/06/2017,  06/12/2017, 01/23/2017 FINDINGS: Continued bilateral left greater than right ground-glass opacity. Mild cardiomegaly. Aortic atherosclerosis. No pneumothorax. Abnormal osseous structures with diffuse sclerosis and multiple small lucent lesions. IMPRESSION: 1. Similar appearance of mild interstitial and ground-glass opacity within the bilateral lungs. Stable enlarged cardiomediastinal silhouette. 2. No significant interval change compared to prior radiograph. Electronically Signed   By: Donavan Foil M.D.   On: 08/18/2017 03:11   Ct Renal Stone Study  Result Date: 08/18/2017 CLINICAL DATA:  Bladder pain. Discharged 2 days ago for urinary tract  infection. History of diabetes and pyelonephritis. EXAM: CT ABDOMEN AND PELVIS WITHOUT CONTRAST TECHNIQUE: Multidetector CT imaging of the abdomen and pelvis was performed following the standard protocol without IV contrast. COMPARISON:  08/03/2017 FINDINGS: Lower chest: Motion artifact limits examination. Atelectasis in the lung bases. Mild cardiac enlargement. Coronary artery calcifications. Hepatobiliary: No focal liver abnormality is seen. Tiny gallstones are suggested. No gallbladder wall thickening, or biliary dilatation. Pancreas: Unremarkable. No pancreatic ductal dilatation or surrounding inflammatory changes. Spleen: Spleen is enlarged. Adrenals/Urinary Tract: No adrenal gland nodules. Subcentimeter hemorrhagic cyst in the upper pole right kidney. No hydronephrosis. No renal stones identified. Bladder wall is diffusely thickened. This may indicate cystitis. A Foley catheter is in place. Gas in the bladder could arise from catheter insertion. Large right posterior bladder wall diverticulum. Stomach/Bowel: Stomach, small bowel, and Juan are decompressed. Diverticulosis of the sigmoid Juan without evidence of diverticulitis. Appendix is normal. Vascular/Lymphatic: Aortic atherosclerosis. No enlarged abdominal or pelvic lymph nodes. Reproductive: Prostate is  unremarkable. Other: Diffuse edema in the subcutaneous fat and mesentery. No free or loculated pelvic or abdominal fluid collections. No free air. Soft tissue nodule in the low pelvic subcutaneous fat anteriorly probably represents a sebaceous cyst. Groin lymph nodes are not pathologically enlarged, likely reactive. Musculoskeletal: Diffuse heterogeneous bone sclerosis consistent with known myeloproliferative disorder. IMPRESSION: 1. Diffuse bladder wall thickening likely indicating cystitis. Large bladder diverticula. 2. No renal or ureteral stone or obstruction. 3. Aortic atherosclerosis. 4. Enlarged spleen. 5. Diffuse edema throughout the subcutaneous and mesenteric fat. No loculated fluid collections. 6. Diffuse heterogeneous bone sclerosis consistent with known myeloproliferative disorder. Electronically Signed   By: Lucienne Capers M.D.   On: 08/18/2017 05:15   [place] Ekg Site Rite  Result Date: 08/06/2017 If Site Rite image not attached, placement could not be confirmed due to current cardiac rhythm.   Time Spent in minutes  30   Jani Gravel M.D on 08/21/2017 at 7:40 AM  Between 7am to 7pm - Pager - [phone]    After 7pm go to [email or web address] - password [facility]  [facility] -  Office  [phone]

## 2017-08-22 DIAGNOSIS — J9611 Chronic respiratory failure with hypoxia: Secondary | ICD-10-CM

## 2017-08-22 DIAGNOSIS — N390 Urinary tract infection, site not specified: Secondary | ICD-10-CM

## 2017-08-22 DIAGNOSIS — I5032 Chronic diastolic (congestive) heart failure: Secondary | ICD-10-CM

## 2017-08-22 LAB — COMPREHENSIVE METABOLIC PANEL WITH GFR
ALT: 12 U/L — ABNORMAL LOW (ref 17–63)
AST: 30 U/L (ref 15–41)
Albumin: 2.5 g/dL — ABNORMAL LOW (ref 3.5–5.0)
Alkaline Phosphatase: 89 U/L (ref 38–126)
Anion gap: 10 (ref 5–15)
BUN: 43 mg/dL — ABNORMAL HIGH (ref 6–20)
CO2: 20 mmol/L — ABNORMAL LOW (ref 22–32)
Calcium: 9.5 mg/dL (ref 8.9–10.3)
Chloride: 112 mmol/L — ABNORMAL HIGH (ref 101–111)
Creatinine, Ser: 1.37 mg/dL — ABNORMAL HIGH (ref 0.61–1.24)
GFR calc Af Amer: 57 mL/min — ABNORMAL LOW
GFR calc non Af Amer: 49 mL/min — ABNORMAL LOW
Glucose, Bld: 85 mg/dL (ref 65–99)
Potassium: 5.8 mmol/L — ABNORMAL HIGH (ref 3.5–5.1)
Sodium: 142 mmol/L (ref 135–145)
Total Bilirubin: 0.5 mg/dL (ref 0.3–1.2)
Total Protein: 5.4 g/dL — ABNORMAL LOW (ref 6.5–8.1)

## 2017-08-22 LAB — BASIC METABOLIC PANEL
Anion gap: 7 (ref 5–15)
BUN: 41 mg/dL — ABNORMAL HIGH (ref 6–20)
CHLORIDE: 113 mmol/L — AB (ref 101–111)
CO2: 22 mmol/L (ref 22–32)
CREATININE: 1.39 mg/dL — AB (ref 0.61–1.24)
Calcium: 9.2 mg/dL (ref 8.9–10.3)
GFR calc non Af Amer: 48 mL/min — ABNORMAL LOW (ref >60)
GFR, EST AFRICAN AMERICAN: 56 mL/min — AB (ref >60)
Glucose, Bld: 104 mg/dL — ABNORMAL HIGH (ref 65–99)
POTASSIUM: 5.4 mmol/L — AB (ref 3.5–5.1)
SODIUM: 142 mmol/L (ref 135–145)

## 2017-08-22 LAB — CBC
HCT: 29.5 % — ABNORMAL LOW (ref 39.0–52.0)
HEMOGLOBIN: 9.2 g/dL — AB (ref 13.0–17.0)
MCH: 27.5 pg (ref 26.0–34.0)
MCHC: 31.2 g/dL (ref 30.0–36.0)
MCV: 88.1 fL (ref 78.0–100.0)
Platelets: 171 10*3/uL (ref 150–400)
RBC: 3.35 MIL/uL — ABNORMAL LOW (ref 4.22–5.81)
RDW: 17.2 % — AB (ref 11.5–15.5)
WBC: 6.8 10*3/uL (ref 4.0–10.5)

## 2017-08-22 LAB — POTASSIUM: Potassium: 7 mmol/L (ref 3.5–5.1)

## 2017-08-22 MED ORDER — SODIUM POLYSTYRENE SULFONATE PO POWD
30.0000 g | Freq: Once | ORAL | Status: AC
  Administered 2017-08-22: 30 g via ORAL
  Filled 2017-08-22: qty 30

## 2017-08-22 MED ORDER — SODIUM POLYSTYRENE SULFONATE 15 GM/60ML PO SUSP
30.0000 g | Freq: Once | ORAL | Status: AC
  Administered 2017-08-22: 30 g via ORAL
  Filled 2017-08-22: qty 120

## 2017-08-22 MED ORDER — INSULIN ASPART 100 UNIT/ML IV SOLN
5.0000 [IU] | Freq: Once | INTRAVENOUS | Status: AC
  Administered 2017-08-22: 5 [IU] via INTRAVENOUS

## 2017-08-22 MED ORDER — SODIUM CHLORIDE 0.9 % IV SOLN
650.0000 mg | INTRAVENOUS | Status: DC
  Administered 2017-08-22 – 2017-08-24 (×3): 650 mg via INTRAVENOUS
  Filled 2017-08-22 (×4): qty 13

## 2017-08-22 MED ORDER — SODIUM POLYSTYRENE SULFONATE 15 GM/60ML PO SUSP
30.0000 g | Freq: Once | ORAL | Status: DC
  Filled 2017-08-22: qty 120

## 2017-08-22 MED ORDER — DEXTROSE 50 % IV SOLN
1.0000 | Freq: Once | INTRAVENOUS | Status: AC
  Administered 2017-08-22: 50 mL via INTRAVENOUS
  Filled 2017-08-22: qty 50

## 2017-08-22 MED ORDER — SODIUM CHLORIDE 0.9 % IV SOLN
1.0000 g | Freq: Once | INTRAVENOUS | Status: AC
  Administered 2017-08-22: 1 g via INTRAVENOUS
  Filled 2017-08-22: qty 10

## 2017-08-22 MED ORDER — DARBEPOETIN ALFA 40 MCG/0.4ML IJ SOSY
40.0000 ug | PREFILLED_SYRINGE | INTRAMUSCULAR | Status: DC
Start: 2017-08-23 — End: 2017-08-24
  Administered 2017-08-23: 40 ug via SUBCUTANEOUS
  Filled 2017-08-22: qty 0.4

## 2017-08-22 NOTE — Progress Notes (Signed)
Critical lab K+7.2 received, MD notified

## 2017-08-22 NOTE — Progress Notes (Addendum)
Pharmacy Antibiotic Note  Juan [name] is a 77 y.o. male admitted on 08/18/2017 with AMS.  Pharmacy has been consulted to dose Daptomycin for VRE/MRSE bacteremia.  Daptomycin originally scheduled to end 4/6, but consult inputted to continue Daptomycin starting today for one more week.  Patient's renal function is improving.  Afebrile, WBC WNL. CK is WNL.  Plan: Daptomycin 650 mg IV Q24H (~8 mg/kg) for one more week (end date 4/14) Monitor renal function, clinical progress, CK qThurs    Height: 6\' 3"  (190.5 cm) Weight: 186 lb 4.8 oz (84.5 kg) IBW/kg (Calculated) : 84.5  Temp (24hrs), Avg:98.2 F (36.8 C), Min:97.7 F (36.5 C), Max:98.7 F (37.1 C)  Recent Labs  Lab 08/18/17 0212 08/18/17 0247 08/19/17 0447  WBC  --  13.5* 7.4  CREATININE  --  1.51* 1.40*  LATICACIDVEN 0.58  --   --     Estimated Creatinine Clearance: 54.5 mL/min (A) (by C-G formula based on SCr of 1.4 mg/dL (H)).    No Known Allergies    Antimicrobials this Admission:  Cefepime 4/3 >> 4/3 Merrem 4/3 >> 4/4 Daptomycin 4/4 >>   Pertinent Labs:  4/4 CK: 22  Cultures: 4/4 BCx - ngtd 4/3 BCx - GPC (BCID: VRE and MRSE) 4/3 UCx - negative  Diana L. Kyung Rudd, PharmD, [place] PGY1 Pharmacy Resident Pager: [phone]  __________________________________  CLARIFICATION >> Intent was for Dapto to continue x 1 week total.  Pt did not miss any doses when abx was discontinued on 4/6.  Will adjust stop date to be following 4/10 dose.  [employer], Pharm.D., BCPS Clinical Pharmacist Pager: [phone] Clinical phone for 08/22/2017 from 8:30-4:00 is [phone]. After 4pm, please call Main Rx [phone]) for assistance. 08/22/2017 11:12 AM

## 2017-08-22 NOTE — Progress Notes (Addendum)
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 Patient has been admitted 6 times since November 2018 with issues either related to recurrent pseudomonal UTI and one hospitalization was secondary to GI bleeding in January 2019. Patient reportedly has a urologist in [facility] but has never followed up during this timeframe. He was most recently discharged on 4/1 after another admission for UTI and hydroureter. Foley catheter was placed. Patient was treated with cefepime for 14 days. Hospitalization was protracted so patient could receive IV antibiotics since he refused a PICC line. Because of this psychiatry was consulted and deemed the patient lacked capacity to make appropriate medical decisions. Patient lives with schizophrenic son who he describes as only being able to heat up food that has been previously prepared and sometimes performing laundry duties. Patient has daughter who he describes as "working 2 jobs" and therefore she is unable to assist with care other than apparently bringing meals by per patient report. He has been placed in SNF back in November for apparent rehab and now refuses to return to SNF because of need to perform PT/OT yet states he wants to improve and go home.   EMSwas called to the patient's home because of altered mentation. Patient apparently was complaining of lower abdominal/bladder  pain.He was noted to be covered in feces (coating the Foley catheter)and smelling of urine.Patient was normotensive and afebrile upon presentation but bradycardic with heart rates between 45 and 57 bpm. He has been given a liter of IV fluids and given a dose of cefepimeIV.WBCof 13,500 withoutleft shift, albumin 2.8, urinalysis not consistent with UTI. Blood cultures and urine cultures obtained prior to administration of antibiotics.  ED Course: Vital Signs:BP (!) 175/58  Pulse (!) 53  Temp 98.4 F (36.9 C) (Rectal)  Resp 16  Ht 6\' 3"  (1.905 m)  Wt 80.7 kg (178 lb)  SpO2 100%  BMI 22.25 kg/m  ZDG:LOVFIE interstitial and groundglass opacities in the bilateral lungs. CT renal stone study:Diffuse bladder wall thickening likely indicating cystitis. Large bladder diverticula. No hydronephrosis or hydroureter. No obstructing stones. Diffuse edema throughout the subcutaneous and mesenteric fat without loculated fluid collections. Pertinent lab data:Potassium 4.5, chloride 114, CO2 19, BUN 48, creatinine 1.518, lactic acid 0.58, white count 13,500 with neutrophils 64% neutrophils 8.6%, hemoglobin 9.7, platelets 314,000,urinalysis with small amount of hemoglobin, trace leukocytes, protein greater than 300, rare bacteria, blood cultures and urine culture obtained in the ER Medications and treatments:Normal saline bolus and antibiotic as above  Assessment & Plan: Principal Problem:   Acute encephalopathy Active Problems:   Type II diabetes mellitus ([place])   Complicated UTI (urinary tract infection)   Sleep apnea, obstructive   History of Splenic infarct   History of recurrent Pseudomonas urinary tract infection   Moderate to severe pulmonary hypertension (HCC)   CKD (chronic kidney disease), stage III (HCC)   Chronic respiratory failure with hypoxia (HCC)   Chronic diastolic CHF (congestive heart failure), NYHA class 2 (HCC)   Myelodysplasia (myelodysplastic  syndrome) (HCC)   HTN (hypertension)  VRE (vancomycin resistant enterococcus) culture positive   Staphylococcus epidermidis infection  Acute encephalopathy NOS: Acute delirium vs. metabolic due to urinary retention and recurrent UTI.  - Resolved. Suspect mentation is chronically abnormal, consider outpatient neuropsych evaluation, trial of aricept.  - Will require facility placement, as home situation is unstable, living with son with limited functional abilities due to schizophrenia. Has significant readmissions.  Recurrent UTI: Urine culture no growth - On daptomycin as below  VRE and MRSE grown in 1 of 2 blood cultures at admission:  - Continue daptomycin (4/4 - 4/10). Per ID note 4/5, plan is to continue for 1 week due to inability to reliably rule out VRE, MRSE bacteremia.  - Repeat blood cultures 4/4 NGTD.  Chronic urinary retention: UOP wnl currently.  - Continue foley catheter per urology. Will need to follow up with urology for trial of voiding.  - Continue flomax  History of T2DM: HbA1c 5.1%, so no longer should carry this diagnosis.  - Can DC CBGs, has remained at inpatient goal  HTN:  - Continue hydralazine 25mg  BID  Chronic hypoxic respiratory failure with chronic diastolic CHF and severe pulmonary HTN: Echo showed EF 55-60% - Euvolemic - Continue baseline O2  Stage III CKD: At baseline  Myelodysplasia: On EPO. - Follow up with Dr. Marin Olp, heme/onc - Continue aranesp, iron  OSA:  - CPAP qHS  Hypokalemia: Unclear etiology, but now up to 5.8 today in absence of renal dysfunction.  - Recheck, empiric kayexalate  DVT prophylaxis: Lovenox Code Status: Full Family Communication: None at bedside Disposition Plan: SNF  Consultants:   ID  Urology  Procedures:  Foley catheterization  Antimicrobials:  Meropenem, cefepime 4/3  Daptomycin 4/4 - 4/10   Subjective: No complaints. Eating well. No fevers. Amenable to SNF today.   Objective: Vitals:    08/22/17 0409 08/22/17 0542 08/22/17 0901 08/22/17 1113  BP: (!) 169/80  (!) 162/90 (!) 170/75  Pulse: 77  71 63  Resp: 18  18 18   Temp: 98 F (36.7 C)  98.2 F (36.8 C) 97.9 F (36.6 C)  TempSrc: Axillary  Oral Oral  SpO2: 97%  99% 100%  Weight:  84.5 kg (186 lb 4.8 oz)    Height:        Intake/Output Summary (Last 24 hours) at 08/22/2017 1336 Last data filed at 08/22/2017 1020 Gross per 24 hour  Intake -  Output 1250 ml  Net -1250 ml   Filed Weights   08/18/17 0145 08/19/17 0445 08/22/17 0542  Weight: 80.7 kg (178 lb) 82.3 kg (181 lb 8 oz) 84.5 kg (186 lb 4.8 oz)    Gen: Elderly male in no distress Pulm: Non-labored breathing 2LPM. Clear to auscultation bilaterally.  CV: Regular rate and rhythm. No murmur, rub, or gallop. No JVD, trace pedal edema. GI: Abdomen soft, non-tender, non-distended, with normoactive bowel sounds. No organomegaly or masses felt. GU: +foley Ext: Warm, no deformities Skin: No rashes, lesions or ulcers Neuro: Alert and oriented. No focal neurological deficits. Psych: Judgement and insight appear normal. Mood & affect appropriate.   Data Reviewed: I have personally reviewed following labs and imaging studies  CBC: Recent Labs  Lab 08/18/17 0247 08/19/17 0447 08/22/17 0659  WBC 13.5* 7.4 6.8  NEUTROABS 8.6*  --   --   HGB 9.7* 8.9* 9.2*  HCT 31.4* 28.9* 29.5*  MCV 87.2 87.8 88.1  PLT 314 230 144   Basic Metabolic Panel: Recent Labs  Lab 08/18/17 0247 08/19/17 0447 08/22/17 8185  NA 145 141 142  K 4.5 4.0 5.8*  CL 114* 113* 112*  CO2 19* 21* 20*  GLUCOSE 89 102* 85  BUN 48* 44* 43*  CREATININE 1.51* 1.40* 1.37*  CALCIUM 9.8 9.1 9.5   GFR: Estimated Creatinine Clearance: 55.7 mL/min (A) (by C-G formula based on SCr of 1.37 mg/dL (H)). Liver Function Tests: Recent Labs  Lab 08/18/17 0247 08/19/17 0447 08/22/17 0659  AST 23 17 30   ALT 14* 12* 12*  ALKPHOS 100 86 89  BILITOT 0.8 0.5 0.5  PROT 5.7* 5.1* 5.4*  ALBUMIN 2.8*  2.4* 2.5*   No results for input(s): LIPASE, AMYLASE in the last 168 hours. No results for input(s): AMMONIA in the last 168 hours. Coagulation Profile: No results for input(s): INR, PROTIME in the last 168 hours. Cardiac Enzymes: Recent Labs  Lab 08/19/17 0447  CKTOTAL 22*   BNP (last 3 results) No results for input(s): PROBNP in the last 8760 hours. HbA1C: No results for input(s): HGBA1C in the last 72 hours. CBG: Recent Labs  Lab 08/15/17 1712 08/15/17 2128 08/16/17 0900 08/18/17 0945  GLUCAP 102* 131* 101* 70   Lipid Profile: No results for input(s): CHOL, HDL, LDLCALC, TRIG, CHOLHDL, LDLDIRECT in the last 72 hours. Thyroid Function Tests: No results for input(s): TSH, T4TOTAL, FREET4, T3FREE, THYROIDAB in the last 72 hours. Anemia Panel: No results for input(s): VITAMINB12, FOLATE, FERRITIN, TIBC, IRON, RETICCTPCT in the last 72 hours. Urine analysis:    Component Value Date/Time   COLORURINE YELLOW 08/18/2017 [place] 08/18/2017 0247   LABSPEC 1.011 08/18/2017 0247   PHURINE 6.0 08/18/2017 0247   GLUCOSEU NEGATIVE 08/18/2017 0247   GLUCOSEU NEGATIVE 05/02/2015 1008   HGBUR SMALL (A) 08/18/2017 0247   BILIRUBINUR NEGATIVE 08/18/2017 0247   KETONESUR NEGATIVE 08/18/2017 0247   PROTEINUR >=300 (A) 08/18/2017 0247   UROBILINOGEN 0.2 05/02/2015 1008   NITRITE NEGATIVE 08/18/2017 0247   LEUKOCYTESUR TRACE (A) 08/18/2017 0247   Recent Results (from the past 240 hour(s))  Blood culture (routine x 2)     Status: Abnormal   Collection Time: 08/18/17  2:30 AM  Result Value Ref Range Status   Specimen Description BLOOD LEFT ARM  Final   Special Requests   Final    BOTTLES DRAWN AEROBIC AND ANAEROBIC Blood Culture results may not be optimal due to an excessive volume of blood received in culture bottles   Culture  Setup Time   Final    GRAM POSITIVE COCCI AEROBIC BOTTLE ONLY CRITICAL RESULT CALLED TO, READ BACK BY AND VERIFIED WITH: J LEDFORD PHARMD  08/19/17 0138 JDW    Culture (A)  Final    VANCOMYCIN RESISTANT ENTEROCOCCUS ISOLATED STAPHYLOCOCCUS SPECIES (COAGULASE NEGATIVE) THE SIGNIFICANCE OF ISOLATING THIS ORGANISM FROM A SINGLE SET OF BLOOD CULTURES WHEN MULTIPLE SETS ARE DRAWN IS UNCERTAIN. PLEASE NOTIFY THE MICROBIOLOGY DEPARTMENT WITHIN ONE WEEK IF SPECIATION AND SENSITIVITIES ARE REQUIRED. Performed at [facility], [place] [address], [place], [place] [address]    Report Status 08/21/2017 FINAL  Final   Organism ID, Bacteria VANCOMYCIN RESISTANT ENTEROCOCCUS ISOLATED  Final      Susceptibility   Vancomycin resistant enterococcus isolated - MIC*    AMPICILLIN >=32 RESISTANT Resistant     VANCOMYCIN >=32 RESISTANT Resistant     GENTAMICIN SYNERGY SENSITIVE Sensitive     LINEZOLID 2 SENSITIVE Sensitive     * VANCOMYCIN RESISTANT ENTEROCOCCUS ISOLATED  Blood Culture ID Panel (Reflexed)     Status: Abnormal  Collection Time: 08/18/17  2:30 AM  Result Value Ref Range Status   Enterococcus species DETECTED (A) NOT DETECTED Final    Comment: CRITICAL RESULT CALLED TO, READ BACK BY AND VERIFIED WITH: J LEDFORD PHARMD 08/19/17 0138 JDW    Vancomycin resistance DETECTED (A) NOT DETECTED Final    Comment: CRITICAL RESULT CALLED TO, READ BACK BY AND VERIFIED WITH: J LEDFORD PHARMD 08/19/17 0138 JDW    Listeria monocytogenes NOT DETECTED NOT DETECTED Final   Staphylococcus species DETECTED (A) NOT DETECTED Final    Comment: Methicillin (oxacillin) resistant coagulase negative staphylococcus. Possible blood culture contaminant (unless isolated from more than one blood culture draw or clinical case suggests pathogenicity). No antibiotic treatment is indicated for blood  culture contaminants. CRITICAL RESULT CALLED TO, READ BACK BY AND VERIFIED WITH: J LEDFORD PHARMD 08/19/17 0138 JDW    Staphylococcus aureus NOT DETECTED NOT DETECTED Final   Methicillin resistance DETECTED (A) NOT DETECTED Final    Comment: CRITICAL RESULT CALLED TO,  READ BACK BY AND VERIFIED WITH: J LEDFORD PHARMD 08/19/17 0138 JDW    Streptococcus species NOT DETECTED NOT DETECTED Final   Streptococcus agalactiae NOT DETECTED NOT DETECTED Final   Streptococcus pneumoniae NOT DETECTED NOT DETECTED Final   Streptococcus pyogenes NOT DETECTED NOT DETECTED Final   Acinetobacter baumannii NOT DETECTED NOT DETECTED Final   Enterobacteriaceae species NOT DETECTED NOT DETECTED Final   Enterobacter cloacae complex NOT DETECTED NOT DETECTED Final   Escherichia coli NOT DETECTED NOT DETECTED Final   Klebsiella oxytoca NOT DETECTED NOT DETECTED Final   Klebsiella pneumoniae NOT DETECTED NOT DETECTED Final   Proteus species NOT DETECTED NOT DETECTED Final   Serratia marcescens NOT DETECTED NOT DETECTED Final   Haemophilus influenzae NOT DETECTED NOT DETECTED Final   Neisseria meningitidis NOT DETECTED NOT DETECTED Final   Pseudomonas aeruginosa NOT DETECTED NOT DETECTED Final   Candida albicans NOT DETECTED NOT DETECTED Final   Candida glabrata NOT DETECTED NOT DETECTED Final   Candida krusei NOT DETECTED NOT DETECTED Final   Candida parapsilosis NOT DETECTED NOT DETECTED Final   Candida tropicalis NOT DETECTED NOT DETECTED Final    Comment: Performed at [facility], [place]. [address], [place], [place] [address]  Blood culture (routine x 2)     Status: None (Preliminary result)   Collection Time: 08/18/17  2:47 AM  Result Value Ref Range Status   Specimen Description BLOOD SITE NOT SPECIFIED  Final   Special Requests IN PEDIATRIC BOTTLE Blood Culture adequate volume  Final   Culture   Final    NO GROWTH 3 DAYS Performed at [facility], [place] [address], [place], [place] [address]    Report Status PENDING  Incomplete  Urine Culture     Status: None   Collection Time: 08/18/17  2:47 AM  Result Value Ref Range Status   Specimen Description URINE, CLEAN CATCH  Final   Special Requests NONE  Final   Culture   Final    NO GROWTH Performed at [facility], [address], [place], [place] [address]    Report Status 2020/07/2417 FINAL  Final  Culture, blood (routine x 2)     Status: None (Preliminary result)   Collection Time: 08/19/17 10:12 PM  Result Value Ref Range Status   Specimen Description BLOOD RIGHT HAND  Final   Special Requests AEROBIC BOTTLE ONLY Blood Culture adequate volume  Final   Culture   Final    NO  GROWTH 2 DAYS Performed at [facility], [place] [address] [place], [place] [address]    Report Status PENDING  Incomplete  Culture, blood (routine x 2)     Status: None (Preliminary result)   Collection Time: 08/19/17 10:12 PM  Result Value Ref Range Status   Specimen Description BLOOD LEFT ANTECUBITAL  Final   Special Requests AEROBIC BOTTLE ONLY Blood Culture adequate volume  Final   Culture   Final    NO GROWTH 2 DAYS Performed at [facility], [place] [address], [place], [place] [address]    Report Status PENDING  Incomplete      Radiology Studies: No results found.  Scheduled Meds: . aspirin EC  81 mg Oral Daily  . B-complex with vitamin C  1 tablet Oral Daily  . [START ON 08/23/2017] darbepoetin ([name]) injection - NON-DIALYSIS  40 mcg Subcutaneous Q Mon-1800  . enoxaparin (LOVENOX) injection  40 mg Subcutaneous Q24H  . feeding supplement (ENSURE ENLIVE)  237 mL Oral BID BM  . ferrous gluconate  324 mg Oral Daily  . haloperidol lactate  1 mg Intravenous Once  . hydrALAZINE  25 mg Oral BID  . tamsulosin  0.4 mg Oral QPC supper   Continuous Infusions: . DAPTOmycin (CUBICIN)  IV 650 mg (08/22/17 1310)     LOS: 4 days   Time spent: 25 minutes.  Vance Gather, MD [facility] Pager [phone]  If 7PM-7AM, please contact night-coverage [email or web address] Password [email or web address] 08/22/2017, 1:36 PM

## 2017-08-23 LAB — CULTURE, BLOOD (ROUTINE X 2)
CULTURE: NO GROWTH
Special Requests: ADEQUATE

## 2017-08-23 LAB — BASIC METABOLIC PANEL
ANION GAP: 6 (ref 5–15)
BUN: 44 mg/dL — ABNORMAL HIGH (ref 6–20)
CHLORIDE: 114 mmol/L — AB (ref 101–111)
CO2: 22 mmol/L (ref 22–32)
Calcium: 9 mg/dL (ref 8.9–10.3)
Creatinine, Ser: 1.29 mg/dL — ABNORMAL HIGH (ref 0.61–1.24)
GFR calc Af Amer: >60 mL/min (ref >60)
GFR, EST NON AFRICAN AMERICAN: 53 mL/min — AB (ref >60)
GLUCOSE: 97 mg/dL (ref 65–99)
POTASSIUM: 4.7 mmol/L (ref 3.5–5.1)
Sodium: 142 mmol/L (ref 135–145)

## 2017-08-23 NOTE — Progress Notes (Signed)
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 Patient has been admitted 6 times since November 2018 with issues either related to recurrent pseudomonal UTI and one hospitalization was secondary to GI bleeding in January 2019. Patient reportedly has a urologist in [facility] but has never followed up during this timeframe. He was most recently discharged on 4/1 after another admission for UTI and hydroureter. Foley catheter was placed. Patient was treated with cefepime for 14 days. Hospitalization was protracted so patient could receive IV antibiotics since he refused a PICC line. Because of this psychiatry was consulted and deemed the patient lacked capacity to make appropriate medical decisions. Patient lives with schizophrenic son who he describes as only being able to heat up food that has been previously prepared and sometimes performing laundry duties. Patient has daughter who he describes as "working 2 jobs" and therefore she is unable to assist with care other than apparently bringing meals by per patient report. He has been placed in SNF back in November for apparent rehab and now refuses to return to SNF because of need to perform PT/OT yet states he wants to improve and go home.   EMSwas called to the patient's home because of altered mentation. Patient apparently was complaining of lower abdominal/bladder  pain.He was noted to be covered in feces (coating the Foley catheter)and smelling of urine.Patient was normotensive and afebrile upon presentation but bradycardic with heart rates between 45 and 57 bpm. He has been given a liter of IV fluids and given a dose of cefepimeIV.WBCof 13,500 withoutleft shift, albumin 2.8, urinalysis not consistent with UTI. Blood cultures and urine cultures obtained prior to administration of antibiotics.  ED Course: Vital Signs:BP (!) 175/58  Pulse (!) 53  Temp 98.4 F (36.9 C) (Rectal)  Resp 16  Ht 6\' 3"  (1.905 m)  Wt 80.7 kg (178 lb)  SpO2 100%  BMI 22.25 kg/m  OEU:MPNTIR interstitial and groundglass opacities in the bilateral lungs. CT renal stone study:Diffuse bladder wall thickening likely indicating cystitis. Large bladder diverticula. No hydronephrosis or hydroureter. No obstructing stones. Diffuse edema throughout the subcutaneous and mesenteric fat without loculated fluid collections. Pertinent lab data:Potassium 4.5, chloride 114, CO2 19, BUN 48, creatinine 1.518, lactic acid 0.58, white count 13,500 with neutrophils 64% neutrophils 8.6%, hemoglobin 9.7, platelets 314,000,urinalysis with small amount of hemoglobin, trace leukocytes, protein greater than 300, rare bacteria, blood cultures and urine culture obtained in the ER Medications and treatments:Normal saline bolus and antibiotic as above  Assessment & Plan: Principal Problem:   Acute encephalopathy Active Problems:   Type II diabetes mellitus ([place])   Complicated UTI (urinary tract infection)   Sleep apnea, obstructive   History of Splenic infarct   History of recurrent Pseudomonas urinary tract infection   Moderate to severe pulmonary hypertension (HCC)   CKD (chronic kidney disease), stage III (HCC)   Chronic respiratory failure with hypoxia (HCC)   Chronic diastolic CHF (congestive heart failure), NYHA class 2 (HCC)   Myelodysplasia (myelodysplastic  syndrome) (HCC)   HTN (hypertension)  VRE (vancomycin resistant enterococcus) culture positive   Staphylococcus epidermidis infection  Acute encephalopathy NOS: Acute delirium vs. metabolic due to urinary retention and recurrent UTI.  - Resolved. Suspect mentation is chronically abnormal, consider outpatient neuropsych evaluation, trial of aricept.   Recurrent UTI: Urine culture no growth - On daptomycin as below  VRE and MRSE grown in 1 of 2 blood cultures at admission:  - Continue daptomycin (4/3 - 4/09). Per ID note 4/5, plan is to continue for 1 week due to inability to reliably rule out VRE, MRSE bacteremia.  - Repeat blood cultures 4/4 NGTD.  Chronic urinary retention: UOP wnl currently.  - Continue foley catheter per urology. Will need to follow up with urology for trial of voiding.  - Continue flomax  History of T2DM: HbA1c 5.1%, so no longer should carry this diagnosis.  - Can DC CBGs, has remained at inpatient goal  HTN:  - Continue hydralazine 25mg  BID  Chronic hypoxic respiratory failure with chronic diastolic CHF and severe pulmonary HTN: Echo showed EF 55-60% - Euvolemic - Continue baseline O2  Stage III CKD: At baseline  Myelodysplasia: On EPO. - Follow up with Dr. Marin Olp, heme/onc - Continue aranesp, iron  OSA:  - CPAP qHS  Hyperkalemia: (Note previous note stated "hypokalemia" which was mistyped) Unclear etiology. Resolved with conservative measures.  - Recheck at follow up  DVT prophylaxis: Lovenox Code Status: Full Family Communication: Son at bedside Disposition Plan: Home situation is unstable, living with son with limited functional abilities due to schizophrenia. Has significant readmissions. However, unfortunately he is unwilling to forfeit social security income for SNF copay and is not CIR candidate. Therefore at the conclusion of 1 week of daptomycin, he will be discharged home. He is a very high risk for readmission.  Consultants:    ID  Urology  Procedures:  Foley catheterization  Antimicrobials:  Meropenem, cefepime 4/3  Daptomycin 4/3 - 4/9   Subjective: Wants to go to CIR. Denies fevers, abd pain, N/V/D, trouble eating.   Objective: Vitals:   08/23/17 0427 08/23/17 0500 08/23/17 0737 08/23/17 1205  BP: (!) 166/86  (!) 166/71 (!) 163/68  Pulse: 87  62 72  Resp: 18  18 18   Temp: 98.4 F (36.9 C)  97.8 F (36.6 C) (!) 97.5 F (36.4 C)  TempSrc: Oral  Oral Oral  SpO2: 100%  100% 100%  Weight:  81.3 kg (179 lb 3.2 oz)    Height:        Intake/Output Summary (Last 24 hours) at 08/23/2017 1636 Last data filed at 08/23/2017 0830 Gross per 24 hour  Intake 560 ml  Output 1450 ml  Net -890 ml   Filed Weights   08/19/17 0445 08/22/17 0542 08/23/17 0500  Weight: 82.3 kg (181 lb 8 oz) 84.5 kg (186 lb 4.8 oz) 81.3 kg (179 lb 3.2 oz)    Gen: Elderly male in no distress sitting in chair speaking with son Pulm: Non-labored breathing 2LPM. Clear to auscultation bilaterally.  CV: Regular rate and rhythm. No murmur, rub, or gallop. No JVD, trace pedal edema. GI: Abdomen soft, non-tender, non-distended, with normoactive bowel sounds. No organomegaly or masses felt. GU: +foley, light yellow urine Ext: Warm, no deformities Skin: No rashes, lesions or ulcers Neuro: Alert and oriented. No focal neurological deficits. Psych: Judgement and insight appear fair. Mood & affect appropriate.   Data Reviewed: I have personally reviewed following labs and imaging studies  CBC: Recent Labs  Lab 08/18/17 0247 08/19/17 0447 08/22/17 3299  WBC 13.5* 7.4 6.8  NEUTROABS 8.6*  --   --   HGB 9.7* 8.9* 9.2*  HCT 31.4* 28.9* 29.5*  MCV 87.2 87.8 88.1  PLT 314 230 158   Basic Metabolic Panel: Recent Labs  Lab 08/18/17 0247 08/19/17 0447 08/22/17 0659 08/22/17 1452 08/22/17 1621 08/23/17 0210  NA 145 141 142  --  142 142  K 4.5 4.0 5.8* 7.0* 5.4* 4.7  CL 114* 113* 112*  --  113* 114*  CO2 19* 21* 20*  --  22  22  GLUCOSE 89 102* 85  --  104* 97  BUN 48* 44* 43*  --  41* 44*  CREATININE 1.51* 1.40* 1.37*  --  1.39* 1.29*  CALCIUM 9.8 9.1 9.5  --  9.2 9.0   GFR: Estimated Creatinine Clearance: 56.9 mL/min (A) (by C-G formula based on SCr of 1.29 mg/dL (H)). Liver Function Tests: Recent Labs  Lab 08/18/17 0247 08/19/17 0447 08/22/17 0659  AST 23 17 30   ALT 14* 12* 12*  ALKPHOS 100 86 89  BILITOT 0.8 0.5 0.5  PROT 5.7* 5.1* 5.4*  ALBUMIN 2.8* 2.4* 2.5*   No results for input(s): LIPASE, AMYLASE in the last 168 hours. No results for input(s): AMMONIA in the last 168 hours. Coagulation Profile: No results for input(s): INR, PROTIME in the last 168 hours. Cardiac Enzymes: Recent Labs  Lab 08/19/17 0447  CKTOTAL 22*   BNP (last 3 results) No results for input(s): PROBNP in the last 8760 hours. HbA1C: No results for input(s): HGBA1C in the last 72 hours. CBG: Recent Labs  Lab 08/18/17 0945  GLUCAP 70   Lipid Profile: No results for input(s): CHOL, HDL, LDLCALC, TRIG, CHOLHDL, LDLDIRECT in the last 72 hours. Thyroid Function Tests: No results for input(s): TSH, T4TOTAL, FREET4, T3FREE, THYROIDAB in the last 72 hours. Anemia Panel: No results for input(s): VITAMINB12, FOLATE, FERRITIN, TIBC, IRON, RETICCTPCT in the last 72 hours. Urine analysis:    Component Value Date/Time   COLORURINE YELLOW 08/18/2017 [place] 08/18/2017 0247   LABSPEC 1.011 08/18/2017 0247   PHURINE 6.0 08/18/2017 0247   GLUCOSEU NEGATIVE 08/18/2017 0247   GLUCOSEU NEGATIVE 05/02/2015 1008   HGBUR SMALL (A) 08/18/2017 0247   BILIRUBINUR NEGATIVE 08/18/2017 0247   KETONESUR NEGATIVE 08/18/2017 0247   PROTEINUR >=300 (A) 08/18/2017 0247   UROBILINOGEN 0.2 05/02/2015 1008   NITRITE NEGATIVE 08/18/2017 0247   LEUKOCYTESUR TRACE (A) 08/18/2017 0247   Recent Results (from the past 240 hour(s))  Blood culture (routine x 2)     Status: Abnormal   Collection Time: 08/18/17  2:30 AM   Result Value Ref Range Status   Specimen Description BLOOD LEFT ARM  Final   Special Requests   Final    BOTTLES DRAWN AEROBIC AND ANAEROBIC Blood Culture results may not be optimal due to an excessive volume of blood received in culture bottles   Culture  Setup Time   Final    GRAM POSITIVE COCCI AEROBIC BOTTLE ONLY CRITICAL RESULT CALLED TO, READ BACK BY AND VERIFIED WITH: J LEDFORD PHARMD 08/19/17 0138 JDW    Culture (A)  Final    VANCOMYCIN RESISTANT ENTEROCOCCUS ISOLATED STAPHYLOCOCCUS SPECIES (COAGULASE NEGATIVE) THE SIGNIFICANCE OF ISOLATING THIS ORGANISM FROM A SINGLE SET OF BLOOD CULTURES WHEN MULTIPLE SETS ARE DRAWN IS UNCERTAIN. PLEASE NOTIFY THE MICROBIOLOGY DEPARTMENT WITHIN ONE WEEK IF SPECIATION AND SENSITIVITIES ARE REQUIRED. Performed at [facility], [place] [address] [place], [place] [address]  Report Status 08/21/2017 FINAL  Final   Organism ID, Bacteria VANCOMYCIN RESISTANT ENTEROCOCCUS ISOLATED  Final      Susceptibility   Vancomycin resistant enterococcus isolated - MIC*    AMPICILLIN >=32 RESISTANT Resistant     VANCOMYCIN >=32 RESISTANT Resistant     GENTAMICIN SYNERGY SENSITIVE Sensitive     LINEZOLID 2 SENSITIVE Sensitive     * VANCOMYCIN RESISTANT ENTEROCOCCUS ISOLATED  Blood Culture ID Panel (Reflexed)     Status: Abnormal   Collection Time: 08/18/17  2:30 AM  Result Value Ref Range Status   Enterococcus species DETECTED (A) NOT DETECTED Final    Comment: CRITICAL RESULT CALLED TO, READ BACK BY AND VERIFIED WITH: J LEDFORD PHARMD 08/19/17 0138 JDW    Vancomycin resistance DETECTED (A) NOT DETECTED Final    Comment: CRITICAL RESULT CALLED TO, READ BACK BY AND VERIFIED WITH: J LEDFORD PHARMD 08/19/17 0138 JDW    Listeria monocytogenes NOT DETECTED NOT DETECTED Final   Staphylococcus species DETECTED (A) NOT DETECTED Final    Comment: Methicillin (oxacillin) resistant coagulase negative staphylococcus. Possible blood culture contaminant (unless  isolated from more than one blood culture draw or clinical case suggests pathogenicity). No antibiotic treatment is indicated for blood  culture contaminants. CRITICAL RESULT CALLED TO, READ BACK BY AND VERIFIED WITH: J LEDFORD PHARMD 08/19/17 0138 JDW    Staphylococcus aureus NOT DETECTED NOT DETECTED Final   Methicillin resistance DETECTED (A) NOT DETECTED Final    Comment: CRITICAL RESULT CALLED TO, READ BACK BY AND VERIFIED WITH: J LEDFORD PHARMD 08/19/17 0138 JDW    Streptococcus species NOT DETECTED NOT DETECTED Final   Streptococcus agalactiae NOT DETECTED NOT DETECTED Final   Streptococcus pneumoniae NOT DETECTED NOT DETECTED Final   Streptococcus pyogenes NOT DETECTED NOT DETECTED Final   Acinetobacter baumannii NOT DETECTED NOT DETECTED Final   Enterobacteriaceae species NOT DETECTED NOT DETECTED Final   Enterobacter cloacae complex NOT DETECTED NOT DETECTED Final   Escherichia coli NOT DETECTED NOT DETECTED Final   Klebsiella oxytoca NOT DETECTED NOT DETECTED Final   Klebsiella pneumoniae NOT DETECTED NOT DETECTED Final   Proteus species NOT DETECTED NOT DETECTED Final   Serratia marcescens NOT DETECTED NOT DETECTED Final   Haemophilus influenzae NOT DETECTED NOT DETECTED Final   Neisseria meningitidis NOT DETECTED NOT DETECTED Final   Pseudomonas aeruginosa NOT DETECTED NOT DETECTED Final   Candida albicans NOT DETECTED NOT DETECTED Final   Candida glabrata NOT DETECTED NOT DETECTED Final   Candida krusei NOT DETECTED NOT DETECTED Final   Candida parapsilosis NOT DETECTED NOT DETECTED Final   Candida tropicalis NOT DETECTED NOT DETECTED Final    Comment: Performed at [facility], [place] [address], [place], [place] [address]  Blood culture (routine x 2)     Status: None   Collection Time: 08/18/17  2:47 AM  Result Value Ref Range Status   Specimen Description BLOOD SITE NOT SPECIFIED  Final   Special Requests IN PEDIATRIC BOTTLE Blood Culture adequate volume  Final    Culture   Final    NO GROWTH 5 DAYS Performed at [facility], [place] [address] [place], [place] [address]    Report Status 08/23/2017 FINAL  Final  Urine Culture     Status: None   Collection Time: 08/18/17  2:47 AM  Result Value Ref Range Status   Specimen Description URINE, CLEAN CATCH  Final   Special Requests NONE  Final   Culture   Final  NO GROWTH Performed at [facility], [place] [address], [place], [place] [address]    Report Status June 13, 202019 FINAL  Final  Culture, blood (routine x 2)     Status: None (Preliminary result)   Collection Time: 08/19/17 10:12 PM  Result Value Ref Range Status   Specimen Description BLOOD RIGHT HAND  Final   Special Requests AEROBIC BOTTLE ONLY Blood Culture adequate volume  Final   Culture   Final    NO GROWTH 4 DAYS Performed at [facility], [place] [address], [place], [place] [address]    Report Status PENDING  Incomplete  Culture, blood (routine x 2)     Status: None (Preliminary result)   Collection Time: 08/19/17 10:12 PM  Result Value Ref Range Status   Specimen Description BLOOD LEFT ANTECUBITAL  Final   Special Requests AEROBIC BOTTLE ONLY Blood Culture adequate volume  Final   Culture   Final    NO GROWTH 4 DAYS Performed at [facility], [place] [address] [place], [place] [address]    Report Status PENDING  Incomplete      Radiology Studies: No results found.  Scheduled Meds: . aspirin EC  81 mg Oral Daily  . B-complex with vitamin C  1 tablet Oral Daily  . darbepoetin ([name]) injection - NON-DIALYSIS  40 mcg Subcutaneous Q Mon-1800  . enoxaparin (LOVENOX) injection  40 mg Subcutaneous Q24H  . feeding supplement (ENSURE ENLIVE)  237 mL Oral BID BM  . ferrous gluconate  324 mg Oral Daily  . haloperidol lactate  1 mg Intravenous Once  . hydrALAZINE  25 mg Oral BID  . tamsulosin  0.4 mg Oral QPC supper   Continuous Infusions: . DAPTOmycin (CUBICIN)  IV Stopped (08/23/17 1601)     LOS: 5 days    Time spent: 25 minutes.  Vance Gather, MD [facility] Pager [phone]  If 7PM-7AM, please contact night-coverage [email or web address] Password [facility] 08/23/2017, 4:36 PM

## 2017-08-23 NOTE — Progress Notes (Signed)
Occupational Therapy Treatment Patient Details Name: Juan [name] MRN: [id number] DOB: 1941-04-21 Today's Date: 08/23/2017    History of present illness Pt is a 77 y.o. 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   OT comments  Pt demonstrating progress toward OT goals this session. He was motivated to ambulate but hesitant to participate in ADL tasks more than simulated toilet transfers this session. Pt demonstrating decreased attention in distracting hallway environments and declined to complete further cognitive challenges to improve ability to safely function in home environment this session. Pt with very poor awareness of his deficits. Continue to recommend short-term SNF level rehabilitation. If pt not agreeable, will need to maximize home health follow-up.   Follow Up Recommendations  SNF;Supervision/Assistance - 24 hour    Equipment Recommendations  Other (comment)(defer to next venue of care)    Recommendations for Other Services      Precautions / Restrictions Precautions Precautions: Fall Restrictions Weight Bearing Restrictions: No       Mobility Bed Mobility Overal bed mobility: Needs Assistance Bed Mobility: Supine to Sit;Sit to Supine     Supine to sit: Supervision Sit to supine: Supervision   General bed mobility comments: Supervision for safety.   Transfers Overall transfer level: Needs assistance Equipment used: Rolling walker (2 wheeled) Transfers: Sit to/from Stand Sit to Stand:  Supervision;From elevated surface         General transfer comment: Pt requesting elevated bed prior to standing attempts.     Balance Overall balance assessment: Needs assistance Sitting-balance support: No upper extremity supported;Feet supported Sitting balance-Leahy Scale: Fair     Standing balance support: During functional activity;Bilateral upper extremity supported Standing balance-Leahy Scale: Poor Standing balance comment: Relies on RW for stability.                            ADL either performed or assessed with clinical judgement   ADL Overall ADL's : Needs assistance/impaired                         Toilet Transfer: Min guard;Ambulation;RW           Functional mobility during ADLs: Min guard;Rolling walker General ADL Comments: Pt states that all he wants to do today is walk. Able to complete functional mobility in hallway with min guard assist and attempt dual tasking. However, pt resistant to further cognition activities despite encouragement. Pt only demonstrating sustained attention.      Vision       Perception     Praxis      Cognition Arousal/Alertness: Awake/alert Behavior During Therapy: Flat affect Overall Cognitive Status: No family/caregiver present to determine baseline cognitive functioning Area of Impairment: Attention;Memory;Following commands;Safety/judgement;Awareness;Problem solving                   Current Attention Level: Sustained Memory: Decreased short-term memory Following Commands: Follows one step commands consistently Safety/Judgement: Decreased awareness of deficits Awareness: Intellectual Problem Solving: Slow processing;Decreased initiation;Requires verbal cues General Comments: Pt only wanting to ambulate today. Attempted to motivate pt  to participate in ADL tasks but only motivated to walk. Incorporated dual tasking cognitive tasks throughout session and pt resisting participating in  these tasks stating "I don't do this at home." Educated pt on attention needs of activities he will participate in at home but he continued to decline.         Exercises     Shoulder Instructions       General Comments Pt able to ambulate in hallway on RA with SpO2 94% and greater. Returned to 2L O2 via [place] once returned to room.    Pertinent Vitals/ Pain       Pain Assessment: No/denies pain  Home Living                                          Prior Functioning/Environment              Frequency  Min 2X/week        Progress Toward Goals  OT Goals(current goals can now be found in the care plan section)  Progress towards OT goals: Progressing toward goals  Acute Rehab OT Goals Patient Stated Goal: take a nap OT Goal Formulation: With patient Time For Goal Achievement: 09/02/17 Potential to Achieve Goals: [place] Discharge plan remains appropriate    Co-evaluation                 AM-PAC PT "6 Clicks" Daily Activity     Outcome Measure   Help from another person eating meals?: A Lot Help from another person taking care of personal grooming?: A Lot Help from another person toileting, which includes using toliet, bedpan, or urinal?: Total Help from another person bathing (including washing, rinsing, drying)?: A Lot Help from another person to put on and taking off regular upper body clothing?: A Lot Help from another person to put on and taking off regular lower body clothing?: A Lot 6 Click Score: 11    End of Session Equipment Utilized During Treatment: Gait belt;Rolling walker  OT Visit Diagnosis: Unsteadiness on feet (R26.81);Other abnormalities of gait and mobility (R26.89);Other symptoms and signs involving cognitive function   Activity Tolerance Patient limited by fatigue   Patient Left in bed;with call [place]/phone within reach;with bed alarm set   Nurse Communication Mobility status        Time: 2248-2500 OT Time  Calculation (min): 27 min  Charges:    Norman Herrlich, MS OTR/L  Pager: [place] 08/23/2017, 4:59 PM

## 2017-08-23 NOTE — Progress Notes (Signed)
Pt continues on IV Daptomycin. Recommendations are for SNF. Per patient he is in his copay days and can not afford to go to SNF. Pt would like to go to CIR but per therapies notes he is qualifying for SNF level rehab.  Pt has been active with [facility] services in past only for short periods of time d/t patient not allowing them in the home or not cooperating when they are allowed in the home. CM inquired with the patient about this and he states they dont keep to their agreement. CM encouraged him let the [facility] personnel come into his home. He voiced agreement as long as they kept to the agreement. CM following.

## 2017-08-24 DIAGNOSIS — Z2239 Carrier of other specified bacterial diseases: Secondary | ICD-10-CM

## 2017-08-24 DIAGNOSIS — B957 Other staphylococcus as the cause of diseases classified elsewhere: Secondary | ICD-10-CM

## 2017-08-24 DIAGNOSIS — D735 Infarction of spleen: Secondary | ICD-10-CM

## 2017-08-24 DIAGNOSIS — D469 Myelodysplastic syndrome, unspecified: Secondary | ICD-10-CM

## 2017-08-24 DIAGNOSIS — I10 Essential (primary) hypertension: Secondary | ICD-10-CM

## 2017-08-24 DIAGNOSIS — I272 Pulmonary hypertension, unspecified: Secondary | ICD-10-CM

## 2017-08-24 LAB — CULTURE, BLOOD (ROUTINE X 2)
Culture: NO GROWTH
Culture: NO GROWTH
SPECIAL REQUESTS: ADEQUATE
SPECIAL REQUESTS: ADEQUATE

## 2017-08-24 NOTE — Discharge Summary (Signed)
Physician Discharge Summary  Juan [name] DUK:[id number] DOB: [birth year] DOA: 08/18/2017  PCP: Colon Branch, MD  Admit date: 08/18/2017 Discharge date: 08/24/2017  Admitted From: Home Disposition: Home (refused SNF)   Recommendations for Outpatient Follow-up:  1. Follow up with Urology in 1-2 weeks for trial of voiding.  2. Recheck BMP in 1 week with PCP.  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 Patient has been admitted 6 times since November 2018 with issues either related to recurrent pseudomonal UTI and one hospitalization was secondary to GI bleeding in January 2019. Patient reportedly has a urologist in [facility] but has never followed up during this timeframe. He was most recently discharged on 4/1 after another admission for UTI and hydroureter. Foley catheter was placed. Patient was treated with cefepime for 14 days. Hospitalization was protracted so patient could receive IV antibiotics since he refused a PICC line. Because of this psychiatry was consulted and deemed the patient lacked capacity to make appropriate medical decisions. Patient lives with schizophrenic son who he describes as only being able to heat up food that has been previously prepared and sometimes performing  laundry duties. Patient has daughter who he describes as "working 2 jobs" and therefore she is unable to assist with care other than apparently bringing meals by per patient report. He has been placed in SNF back in November.  EMSwas called to the patient's home because of altered mentation. Patient apparently was complaining of lower abdominal/bladder pain.He was noted to be covered in feces (coating the Foley catheter)and smelling of urine.Patient was normotensive and afebrile upon presentation but bradycardic with heart rates between 45 and 57 bpm.   ED Course: BJS:EGBTDV interstitial and groundglass opacities in the bilateral lungs. CT renal stone study:Diffuse bladder wall thickening likely indicating cystitis. Large bladder diverticula. No hydronephrosis or hydroureter. No obstructing stones. Diffuse edema throughout the subcutaneous and mesenteric fat without loculated fluid collections. Pertinent lab data:Potassium 4.5, chloride 114, CO2 19, BUN 48, creatinine 1.518, lactic acid 0.58, white count 13,500 with neutrophils 64% neutrophils 8.6%, hemoglobin 9.7, platelets 314,000,urinalysis with small amount of hemoglobin, trace leukocytes, protein greater than 300, rare bacteria, blood cultures and urine culture obtained in the ER Medications and treatments:Normal saline bolus and a dose of cefepimeIV.  Hospital course: 12 of 2 blood cultures returned VRE and MRSE, so ID was consulted and culture repeated and eventually remained negative. ID recommended 1 week of daptomycin which was started 4/3. Fortunately his mental status has returned to baseline and he has completed the week of daptomycin. Due to debility, therapy has worked with the patient frequently and continues to recommend short term rehabilitation at skilled nursing facility at discharge. Unfortunately his insurance will no longer cover this in entirety, requiring him to pay daily copays. He is unwilling to forfeit  social security income, and declines SNF. He will be discharged home with maximal home health assistance. Foley to be continued until  urology follow up in 1 - 2 weeks.   Discharge Diagnoses:  Principal Problem:   Acute encephalopathy Active Problems:   Type II diabetes mellitus ([place])   Complicated UTI (urinary tract infection)   Sleep apnea, obstructive   History of Splenic infarct   History of recurrent Pseudomonas urinary tract infection   Moderate to severe pulmonary hypertension (HCC)   CKD (chronic kidney disease), stage III (HCC)   Chronic respiratory failure with hypoxia (HCC)   Chronic diastolic CHF (congestive heart failure), NYHA class 2 (HCC)   Myelodysplasia (myelodysplastic syndrome) (HCC)   HTN (hypertension)   VRE (vancomycin resistant enterococcus) culture positive   Staphylococcus epidermidis infection  Acute encephalopathy NOS: Acute delirium vs. metabolic due to urinary retention and recurrent UTI.  - Resolved. Suspect mentation is chronically abnormal, consider outpatient neuropsych evaluation, trial of aricept.   Recurrent UTI: Urine culture no growth - Completed daptomycin as below  VRE and MRSE grown in 1 of 2 blood cultures at admission:  - Continued daptomycin (4/3 - 4/09). Per ID note 4/5, plan is to continue for 1 week due to inability to reliably rule out VRE, MRSE bacteremia.  - Repeat blood cultures 4/4 NGTD.  Chronic urinary retention: UOP wnl currently.  - Continue foley catheter per urology. Will need to follow up with urology for trial of voiding.  - Continue flomax  History of T2DM: HbA1c 5.1%, so no longer should carry this diagnosis.  - Has remained at inpatient goal  HTN:  - Continue hydralazine 25mg  TID  Chronic hypoxic respiratory failure with chronic diastolic CHF and severe pulmonary HTN: Echo showed EF 55-60% - Began to be volume up with no dyspnea, will restart lasix.  - Continue baseline O2  Stage III CKD: At  baseline  Myelodysplasia: On EPO. - Follow up with Dr. Marin Olp, heme/onc - Continue aranesp, iron  OSA:  - CPAP qHS  Hyperkalemia: (Note previous note stated "hypokalemia" which was mistyped) Unclear etiology. Resolved with conservative measures.  - Recheck at follow up  Discharge Instructions Discharge Instructions    Diet - low sodium heart healthy   Complete by:  As directed    Discharge instructions   Complete by:  As directed    You were admitted for mental confusion that may have been due to an infection. You were treated with IV antibiotics for 7 days and have returned to your mental baseline. It is strongly recommended for you to go to a rehabilitation facility to get stronger and remain safer prior to returning home but you have declined this. Please follow these recommendations:  - Continue taking medications as listed. No new medications are needed, you've completed the course of antibiotics - Continue with the foley catheter in place, a home health nurse will help you learn how to maintain it.  - You need to call TODAY to schedule a follow up appointment with your urologist to take the catheter out in the next 1-2 weeks.  - Call to schedule an appointment with your PCP in the next 1-2 weeks as well.  - If your symptoms return, seek medical attention right away.   Increase activity slowly   Complete by:  As directed      Allergies as of 08/24/2017   No Known Allergies     Medication List    TAKE these medications   acetaminophen 325 MG tablet Commonly known as:  TYLENOL Take 2 tablets (650 mg total) by mouth every 6 (six) hours as needed for  mild pain (or Fever >/= 101).   aspirin EC 81 MG tablet Take 81 mg by mouth daily.   b complex vitamins tablet Take 1 tablet by mouth daily.   benzonatate 100 MG capsule Commonly known as:  TESSALON Take 1 capsule (100 mg total) by mouth 2 (two) times daily as needed for cough.   ferrous gluconate 240 (27 FE) MG  tablet Commonly known as:  FERGON Take 240 mg by mouth daily.   furosemide 40 MG tablet Commonly known as:  LASIX Take 80mg  (2tabs) in morning and 40mg  (1tab) in evening   hydrALAZINE 25 MG tablet Commonly known as:  APRESOLINE Take 25 mg by mouth 3 (three) times daily.   OXYGEN Inhale 2 L into the lungs continuous.   tamsulosin 0.4 MG Caps capsule Commonly known as:  FLOMAX Take 1 capsule (0.4 mg total) by mouth daily after supper.      Follow-up Information    Colon Branch, MD. Schedule an appointment as soon as possible for a visit in 1 week(s).   Specialty:  Internal Medicine Contact information: [address] [place] [address] [phone]        Ardis Hughs, MD. Schedule an appointment as soon as possible for a visit in 1 week(s).   Specialty:  Urology Contact information: [address] [place] [address] [phone]          No Known Allergies  Consultations:  ID  Procedures/Studies: Ct Head Wo Contrast  Result Date: 08/18/2017 CLINICAL DATA:  Altered mental status EXAM: CT HEAD WITHOUT CONTRAST TECHNIQUE: Contiguous axial images were obtained from the base of the skull through the vertex without intravenous contrast. COMPARISON:  None. FINDINGS: Brain: No evidence of acute infarction, hemorrhage, extra-axial collection, ventriculomegaly, or mass effect. Generalized cerebral atrophy. Periventricular white matter low attenuation likely secondary to microangiopathy. Vascular: No hyperdense vessel. Skull: Negative for fracture or focal lesion. Sinuses/Orbits: Visualized portions of the orbits are unremarkable. Visualized portions of the paranasal sinuses and mastoid air cells are unremarkable. Other: None. IMPRESSION: No acute intracranial pathology. Electronically Signed   By: Kathreen Devoid   On: 08/18/2017 11:48   Ct Abdomen Pelvis W Contrast  Result Date: 08/03/2017 CLINICAL DATA:  Lower abdominal pain. EXAM: CT ABDOMEN AND  PELVIS WITH CONTRAST TECHNIQUE: Multidetector CT imaging of the abdomen and pelvis was performed using the standard protocol following bolus administration of intravenous contrast. CONTRAST:  167mL ISOVUE-300 IOPAMIDOL (ISOVUE-300) INJECTION 61% COMPARISON:  06/07/2017 FINDINGS: Lower chest: Motion artifact and subsegmental atelectasis in the lung bases. No pleural effusion. Coronary artery atherosclerosis. Unchanged dilatation of the main pulmonary artery to 5 cm diameter as can be seen with pulmonary arterial hypertension. Hepatobiliary: No focal liver abnormality. Moderately distended gallbladder with possible punctate stone but no gallbladder wall thickening or other acute inflammatory changes. No biliary dilatation. Pancreas: Unremarkable. Spleen: Unchanged splenomegaly. Apparent decreased size of peripheral wedge-shaped hypodensity suggesting evolving infarct. Adrenals/Urinary Tract: Unremarkable adrenal glands. Unchanged 2.7 cm right lower pole renal cyst. Mild to moderate right hydroureteronephrosis has mildly improved. Left hydronephrosis on the prior study has resolved. Distended, thick-walled bladder containing multiple diverticula is similar to the prior study. Stomach/Bowel: The stomach is within normal limits. No bowel obstruction. Extensive descending and sigmoid colon diverticulosis without significant acute inflammatory changes identified within limitations of motion artifact. Scattered locules scratched at scattered small locules of gas in the right mid to upper abdomen are favored to be within decompressed small bowel loops  rather than extraluminal. The appendix is not well visualized, with motion artifact limiting assessment. No inflammatory changes are seen in the right lower quadrant to suggest acute appendicitis. Vascular/Lymphatic: Abdominal aortic atherosclerosis without aneurysm. No enlarged lymph nodes. Reproductive: At most mild prostatic enlargement, unchanged. Other: No intraperitoneal  free fluid. Unchanged 1.8 cm subcutaneous soft tissue nodule in the left inguinal region. Mild diffuse body wall edema. Musculoskeletal: Diffuse heterogeneous sclerosis throughout the visualized osseous structures, unchanged. IMPRESSION: 1. No acute abnormality identified in the abdomen or pelvis. 2. Mildly improved right hydroureteronephrosis. Resolved left hydronephrosis. 3. Unchanged distension of a thick-walled urinary bladder containing multiple diverticula. 4. Unchanged splenomegaly with evolving infarct as previously seen. 5. Unchanged diffusely abnormal appearance of the bones consistent with known myeloproliferative disorder. 6. Colonic diverticulosis. 7.  Aortic Atherosclerosis (ICD10-I70.0). Electronically Signed   By: Logan Bores M.D.   On: 08/03/2017 17:49   Dg Chest Portable 1 View  Result Date: 08/18/2017 CLINICAL DATA:  Altered mental status EXAM: PORTABLE CHEST 1 VIEW COMPARISON:  07/16/2017, 07/06/2017, 06/12/2017, 01/23/2017 FINDINGS: Continued bilateral left greater than right ground-glass opacity. Mild cardiomegaly. Aortic atherosclerosis. No pneumothorax. Abnormal osseous structures with diffuse sclerosis and multiple small lucent lesions. IMPRESSION: 1. Similar appearance of mild interstitial and ground-glass opacity within the bilateral lungs. Stable enlarged cardiomediastinal silhouette. 2. No significant interval change compared to prior radiograph. Electronically Signed   By: Donavan Foil M.D.   On: 08/18/2017 03:11   Ct Renal Stone Study  Result Date: 08/18/2017 CLINICAL DATA:  Bladder pain. Discharged 2 days ago for urinary tract infection. History of diabetes and pyelonephritis. EXAM: CT ABDOMEN AND PELVIS WITHOUT CONTRAST TECHNIQUE: Multidetector CT imaging of the abdomen and pelvis was performed following the standard protocol without IV contrast. COMPARISON:  08/03/2017 FINDINGS: Lower chest: Motion artifact limits examination. Atelectasis in the lung bases. Mild cardiac  enlargement. Coronary artery calcifications. Hepatobiliary: No focal liver abnormality is seen. Tiny gallstones are suggested. No gallbladder wall thickening, or biliary dilatation. Pancreas: Unremarkable. No pancreatic ductal dilatation or surrounding inflammatory changes. Spleen: Spleen is enlarged. Adrenals/Urinary Tract: No adrenal gland nodules. Subcentimeter hemorrhagic cyst in the upper pole right kidney. No hydronephrosis. No renal stones identified. Bladder wall is diffusely thickened. This may indicate cystitis. A Foley catheter is in place. Gas in the bladder could arise from catheter insertion. Large right posterior bladder wall diverticulum. Stomach/Bowel: Stomach, small bowel, and colon are decompressed. Diverticulosis of the sigmoid colon without evidence of diverticulitis. Appendix is normal. Vascular/Lymphatic: Aortic atherosclerosis. No enlarged abdominal or pelvic lymph nodes. Reproductive: Prostate is unremarkable. Other: Diffuse edema in the subcutaneous fat and mesentery. No free or loculated pelvic or abdominal fluid collections. No free air. Soft tissue nodule in the low pelvic subcutaneous fat anteriorly probably represents a sebaceous cyst. Groin lymph nodes are not pathologically enlarged, likely reactive. Musculoskeletal: Diffuse heterogeneous bone sclerosis consistent with known myeloproliferative disorder. IMPRESSION: 1. Diffuse bladder wall thickening likely indicating cystitis. Large bladder diverticula. 2. No renal or ureteral stone or obstruction. 3. Aortic atherosclerosis. 4. Enlarged spleen. 5. Diffuse edema throughout the subcutaneous and mesenteric fat. No loculated fluid collections. 6. Diffuse heterogeneous bone sclerosis consistent with known myeloproliferative disorder. Electronically Signed   By: Lucienne Capers M.D.   On: 08/18/2017 05:15   [place] Ekg Site Rite  Result Date: 08/06/2017 If Site Rite image not attached, placement could not be confirmed due to current  cardiac rhythm.  Subjective: Slept well, eating well, still unwilling to go to SNF or anywhere but home.  No dyspnea, chest pain, or other changes.   Discharge Exam: Vitals:   08/24/17 0445 08/24/17 1220  BP: (!) 166/81 (!) 155/89  Pulse: 73 78  Resp: 18 18  Temp: 98 F (36.7 C) 97.8 F (36.6 C)  SpO2: 98% 99%   General: Pt is alert, awake, not in acute distress Cardiovascular: RRR, S1/S2 +, no rubs, no gallops. 1+ pitting LE edema.  Respiratory: Clear, nonlabored on 2L. Abdominal: Soft, NT, ND, bowel sounds + Neuro: No focal deficits. Alert and oriented.  Labs: BNP (last 3 results) Recent Labs    01/06/17 0931 07/06/17 1317 07/16/17 0558  BNP 435.8* 419.5* 270.6*   Basic Metabolic Panel: Recent Labs  Lab 08/18/17 0247 08/19/17 0447 08/22/17 0659 08/22/17 1452 08/22/17 1621 08/23/17 0210  NA 145 141 142  --  142 142  K 4.5 4.0 5.8* 7.0* 5.4* 4.7  CL 114* 113* 112*  --  113* 114*  CO2 19* 21* 20*  --  22 22  GLUCOSE 89 102* 85  --  104* 97  BUN 48* 44* 43*  --  41* 44*  CREATININE 1.51* 1.40* 1.37*  --  1.39* 1.29*  CALCIUM 9.8 9.1 9.5  --  9.2 9.0   Liver Function Tests: Recent Labs  Lab 08/18/17 0247 08/19/17 0447 08/22/17 0659  AST 23 17 30   ALT 14* 12* 12*  ALKPHOS 100 86 89  BILITOT 0.8 0.5 0.5  PROT 5.7* 5.1* 5.4*  ALBUMIN 2.8* 2.4* 2.5*   No results for input(s): LIPASE, AMYLASE in the last 168 hours. No results for input(s): AMMONIA in the last 168 hours. CBC: Recent Labs  Lab 08/18/17 0247 08/19/17 0447 08/22/17 0659  WBC 13.5* 7.4 6.8  NEUTROABS 8.6*  --   --   HGB 9.7* 8.9* 9.2*  HCT 31.4* 28.9* 29.5*  MCV 87.2 87.8 88.1  PLT 314 230 171   Cardiac Enzymes: Recent Labs  Lab 08/19/17 0447  CKTOTAL 22*   BNP: Invalid input(s): POCBNP CBG: Recent Labs  Lab 08/18/17 0945  GLUCAP 70   D-Dimer No results for input(s): DDIMER in the last 72 hours. Hgb A1c No results for input(s): HGBA1C in the last 72 hours. Lipid  Profile No results for input(s): CHOL, HDL, LDLCALC, TRIG, CHOLHDL, LDLDIRECT in the last 72 hours. Thyroid function studies No results for input(s): TSH, T4TOTAL, T3FREE, THYROIDAB in the last 72 hours.  Invalid input(s): FREET3 Anemia work up No results for input(s): VITAMINB12, FOLATE, FERRITIN, TIBC, IRON, RETICCTPCT in the last 72 hours. Urinalysis    Component Value Date/Time   COLORURINE YELLOW 08/18/2017 0247   APPEARANCEUR CLEAR 08/18/2017 0247   LABSPEC 1.011 08/18/2017 0247   PHURINE 6.0 08/18/2017 0247   GLUCOSEU NEGATIVE 08/18/2017 0247   GLUCOSEU NEGATIVE 05/02/2015 1008   HGBUR SMALL (A) 08/18/2017 0247   BILIRUBINUR NEGATIVE 08/18/2017 0247   KETONESUR NEGATIVE 08/18/2017 0247   PROTEINUR >=300 (A) 08/18/2017 0247   UROBILINOGEN 0.2 05/02/2015 1008   NITRITE NEGATIVE 08/18/2017 0247   LEUKOCYTESUR TRACE (A) 08/18/2017 0247    Microbiology Recent Results (from the past 240 hour(s))  Blood culture (routine x 2)     Status: Abnormal   Collection Time: 08/18/17  2:30 AM  Result Value Ref Range Status   Specimen Description BLOOD LEFT ARM  Final   Special Requests   Final    BOTTLES DRAWN AEROBIC AND ANAEROBIC Blood Culture results may not be optimal due to an excessive volume of blood received in culture bottles  Culture  Setup Time   Final    GRAM POSITIVE COCCI AEROBIC BOTTLE ONLY CRITICAL RESULT CALLED TO, READ BACK BY AND VERIFIED WITH: J LEDFORD [facility] 08/19/17 0138 JDW    Culture (A)  Final    VANCOMYCIN RESISTANT ENTEROCOCCUS ISOLATED STAPHYLOCOCCUS SPECIES (COAGULASE NEGATIVE) THE SIGNIFICANCE OF ISOLATING THIS ORGANISM FROM A SINGLE SET OF BLOOD CULTURES WHEN MULTIPLE SETS ARE DRAWN IS UNCERTAIN. PLEASE NOTIFY THE MICROBIOLOGY DEPARTMENT WITHIN ONE WEEK IF SPECIATION AND SENSITIVITIES ARE REQUIRED. Performed at [facility], [place] [address], [place], [place] [address]    Report Status 08/21/2017 FINAL  Final   Organism ID, Bacteria VANCOMYCIN  RESISTANT ENTEROCOCCUS ISOLATED  Final      Susceptibility   Vancomycin resistant enterococcus isolated - MIC*    AMPICILLIN >=32 RESISTANT Resistant     VANCOMYCIN >=32 RESISTANT Resistant     GENTAMICIN SYNERGY SENSITIVE Sensitive     LINEZOLID 2 SENSITIVE Sensitive     * VANCOMYCIN RESISTANT ENTEROCOCCUS ISOLATED  Blood Culture ID Panel (Reflexed)     Status: Abnormal   Collection Time: 08/18/17  2:30 AM  Result Value Ref Range Status   Enterococcus species DETECTED (A) NOT DETECTED Final    Comment: CRITICAL RESULT CALLED TO, READ BACK BY AND VERIFIED WITH: J LEDFORD PHAR[facility] 08/19/17 0138 JDW    Vancomycin resistance DETECTED (A) NOT DETECTED Final    Comment: CRITICAL RESULT CALLED TO, READ BACK BY AND VERIFIED WITH: J LEDFORD PHARMD 08/19/17 0138 JDW    Listeria monocytogenes NOT DETECTED NOT DETECTED Final   Staphylococcus species DETECTED (A) NOT DETECTED Final    Comment: Methicillin (oxacillin) resistant coagulase negative staphylococcus. Possible blood culture contaminant (unless isolated from more than one blood culture draw or clinical case suggests pathogenicity). No antibiotic treatment is indicated for blood  culture contaminants. CRITICAL RESULT CALLED TO, READ BACK BY AND VERIFIED WITH: J LEDFORD PHARMD 08/19/17 0138 JDW    Staphylococcus aureus NOT DETECTED NOT DETECTED Final   Methicillin resistance DETECTED (A) NOT DETECTED Final    Comment: CRITICAL RESULT CALLED TO, READ BACK BY AND VERIFIED WITH: J LEDFORD PHARMD 08/19/17 0138 JDW    Streptococcus species NOT DETECTED NOT DETECTED Final   Streptococcus agalactiae NOT DETECTED NOT DETECTED Final   Streptococcus pneumoniae NOT DETECTED NOT DETECTED Final   Streptococcus pyogenes NOT DETECTED NOT DETECTED Final   Acinetobacter baumannii NOT DETECTED NOT DETECTED Final   Enterobacteriaceae species NOT DETECTED NOT DETECTED Final   Enterobacter cloacae complex NOT DETECTED NOT DETECTED Final   Escherichia coli NOT  DETECTED NOT DETECTED Final   Klebsiella oxytoca NOT DETECTED NOT DETECTED Final   Klebsiella pneumoniae NOT DETECTED NOT DETECTED Final   Proteus species NOT DETECTED NOT DETECTED Final   Serratia marcescens NOT DETECTED NOT DETECTED Final   Haemophilus influenzae NOT DETECTED NOT DETECTED Final   Neisseria meningitidis NOT DETECTED NOT DETECTED Final   Pseudomonas aeruginosa NOT DETECTED NOT DETECTED Final   Candida albicans NOT DETECTED NOT DETECTED Final   Candida glabrata NOT DETECTED NOT DETECTED Final   Candida krusei NOT DETECTED NOT DETECTED Final   Candida parapsilosis NOT DETECTED NOT DETECTED Final   Candida tropicalis NOT DETECTED NOT DETECTED Final    Comment: Performed at [facility], [place]. [address] [place], [place] [address]  Blood culture (routine x 2)     Status: None   Collection Time: 08/18/17  2:47 AM  Result Value Ref Range Status   Specimen Description BLOOD  SITE NOT SPECIFIED  Final   Special Requests IN PEDIATRIC BOTTLE Blood Culture adequate volume  Final   Culture   Final    NO GROWTH 5 DAYS Performed at [facility], [place] [address] [place], [place] [address]    Report Status 08/23/2017 FINAL  Final  Urine Culture     Status: None   Collection Time: 08/18/17  2:47 AM  Result Value Ref Range Status   Specimen Description URINE, CLEAN CATCH  Final   Special Requests NONE  Final   Culture   Final    NO GROWTH Performed at [facility], [place] [address], [place], [place] [address]    Report Status 2020-03-1618 FINAL  Final  Culture, blood (routine x 2)     Status: None   Collection Time: 08/19/17 10:12 PM  Result Value Ref Range Status   Specimen Description BLOOD RIGHT HAND  Final   Special Requests AEROBIC BOTTLE ONLY Blood Culture adequate volume  Final   Culture   Final    NO GROWTH 5 DAYS Performed at [facility], [place] [address], [place], [place] [address]    Report Status 08/24/2017 FINAL  Final  Culture, blood (routine  x 2)     Status: None   Collection Time: 08/19/17 10:12 PM  Result Value Ref Range Status   Specimen Description BLOOD LEFT ANTECUBITAL  Final   Special Requests AEROBIC BOTTLE ONLY Blood Culture adequate volume  Final   Culture   Final    NO GROWTH 5 DAYS Performed at [facility], [place] [address] [place], [place] [address]    Report Status 08/24/2017 FINAL  Final    Time coordinating discharge: Approximately 40 minutes  Vance Gather, MD  [facility] 08/24/2017, 2:29 PM Pager [phone]

## 2017-08-24 NOTE — Plan of Care (Signed)

## 2017-08-24 NOTE — Progress Notes (Signed)
Physical Therapy Treatment Patient Details Name: Juan [name] MRN: [id number] DOB: 03/14/1941 Today's Date: 08/24/2017    History of Present Illness Pt is a 77 y.o. 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    PT Comments    Patient eager to return home and adamant about no walking or working with therapy today. Focused on trying to get dressed. Assisted pt with changing into street clothes- worked on standing balance and ADl tasks sitting EOB. Pt requires assist to donn jeans in sitting/standing and requires UE support in standing. Continues to demonstrate poor safety awareness and weakness as pt required EOB to be elevated prior to standing. Pt declines SNF and will be returning home. Would benefit from [facility], if eligible. Will follow.  Follow Up Recommendations  SNF([facility])     Equipment Recommendations  None recommended by PT    Recommendations for Other Services       Precautions / Restrictions Precautions Precautions: Fall Restrictions Weight Bearing Restrictions: No    Mobility  Bed Mobility               General bed mobility comments: Sitting EOB upon PT arrival.  Transfers Overall transfer level: Needs assistance Equipment used: Rolling walker (2 wheeled) Transfers: Sit to/from Stand Sit to Stand: From elevated surface;Min guard         General transfer comment: Pt requesting elevated bed prior to standing attempts.   Ambulation/Gait             General Gait  Details: Pt declined.    Stairs            Wheelchair Mobility    Modified Rankin (Stroke Patients Only)       Balance Overall balance assessment: Needs assistance Sitting-balance support: Feet supported;No upper extremity supported Sitting balance-Leahy Scale: Fair Sitting balance - Comments: Able to doff/donn gown and shirt/sweatshirt with setup. Needed Max A to donn jeans sitting EOB.   Standing balance support: During functional activity;Bilateral upper extremity supported Standing balance-Leahy Scale: Poor Standing balance comment: Relies on RW for stability. Required assist to donn pants in standing, able to remove UEs for a short period.                            Cognition Arousal/Alertness: Awake/alert Behavior During Therapy: Flat affect Overall Cognitive Status: No family/caregiver present to determine baseline cognitive functioning Area of Impairment: Attention;Memory;Following commands;Safety/judgement;Awareness;Problem solving                   Current Attention Level: Sustained Memory: Decreased short-term memory Following Commands: Follows one step commands consistently Safety/Judgement: Decreased awareness of deficits Awareness: Intellectual Problem Solving: Slow processing;Requires verbal cues General Comments: Refusing to ambulate or mobilize today secondary to adamant about returning home. Despite education on wanting to try RW vs SPC to see which is more appropriate, "Ive always used the cane, it will be fine."      Exercises      General Comments        Pertinent Vitals/Pain Pain Assessment: No/denies pain  Home Living                      Prior Function            PT Goals (current goals can now be found in the care plan section) Progress towards PT goals: Not progressing toward goals - comment(secondary to behaviorial issues)    Frequency    Min 2X/week      PT Plan Current plan remains  appropriate    Co-evaluation              AM-PAC PT "6 Clicks" Daily Activity  Outcome Measure  Difficulty turning over in bed (including adjusting bedclothes, sheets and blankets)?: Unable Difficulty moving from lying on back to sitting on the side of the bed? : Unable Difficulty sitting down on and standing up from a chair with arms (e.g., wheelchair, bedside commode, etc,.)?: Unable Help needed moving to and from a bed to chair (including a wheelchair)?: A Lot Help needed walking in hospital room?: A Lot Help needed climbing 3-5 steps with a railing? : A Lot 6 Click Score: 9    End of Session Equipment Utilized During Treatment: Oxygen Activity Tolerance: Patient limited by fatigue Patient left: in bed;with call [place]/phone within reach;with bed alarm set(sitting EOB.) Nurse Communication: Mobility status PT Visit Diagnosis: Other abnormalities of gait and mobility (R26.89)     Time: 4540-9811 PT Time Calculation (min) (ACUTE ONLY): 28 min  Charges:  $Therapeutic Activity: 23-37 mins                    G Codes:       Wray Kearns, PT, DPT [phone]     Marguarite Arbour A Basha Krygier 08/24/2017, 3:59 PM

## 2017-08-24 NOTE — Care Management Note (Signed)
Case Management Note  Patient Details  Name: Juan [name] MRN: [id number] Date of Birth: 1941-03-29  Subjective/Objective:                    Action/Plan: Pt discharging home with [facility] services. CM provided him choice and [facility] decided on. Patient states he does not want any services but the RN. CM encouraged him to let therapy at least evaluate him. [name] with [facility] will follow up with him to see if he will accept the other services.  Pt also setting specific parameters up for the [facility] service. He states they have to come at a specific time on a specific day and are not to call him or change the time. [name] with [facility] is going to try and meet his requests but [facility] may not be able to always accommodate his requests d/t other patients they will be seeing.  Pt asking for PTAR home. CM spoke with bedside RN and 3:30 pm decided on. Arrangements made and transport form on the chart. Bedside RN updated.    Expected Discharge Date:  08/24/17               Expected Discharge Plan:  Skilled Nursing Facility  In-House Referral:  Clinical Social Work  Discharge planning Services  CM Consult  Post Acute Care Choice:  Home Health Choice offered to:  Patient  DME Arranged:    DME Agency:     HH Arranged:  RN, PT, OT, Nurse's Aide, Social Work [employer] Agency:  [place]  Status of Service:  Completed, signed off  If discussed at [employer], dates discussed:    Additional Comments:  Pollie Friar, RN 08/24/2017, 3:24 PM

## 2017-08-24 NOTE — Consult Note (Signed)
   [facility] CM Inpatient Consult   08/24/2017  Devin Ganaway [birth year] [phone]   Patient assessed for multiple admissions and re-admissions in the Medicare ACO.  Chart review reveals Physical Therapist assessment for a skilled facility stay, patient refusing, can't afford the co-pays vs [facility], patient had been assessed for program in the past and refused services.  Met with the patient regarding the [facility] program and or [facility] services.  Patient states, "I know who you are.  They have set me up with an agency and here it is [he pulled a brochure and contact information from his pocket with [facility] they are suppose to come and see me Thursday morning.  That's the agreement."  Inquired regarding [facility] Management following as well.  Patient states, "Let's see what they are going to do. I have your information but I will take your paper."  A brochure given with contact information including the 24 hour nurse advise line. Explained that [place] Management is a covered benefit of insurance.  Explained that [place] Management does not interfere with or replace any services arranged by the inpatient care management staff.  Patient declined services with [facility].  [name], RN BSN [facility] Liaison  [phone] business mobile phone Toll free office [phone]

## 2017-08-25 ENCOUNTER — Telehealth: Payer: Self-pay | Admitting: *Deleted

## 2017-08-25 ENCOUNTER — Telehealth: Payer: Self-pay | Admitting: Internal Medicine

## 2017-08-25 NOTE — Telephone Encounter (Signed)
Spoke w/ Juan [name]- informed that we have not seen Pt in office since 02/2017- informed unable to give verbals. [name] verbalized understanding.

## 2017-08-25 NOTE — Telephone Encounter (Signed)
Pt's son answered and informed me he would be taking a message for pt. Left message for pt to call us back at the office and schedule hospital f/u appointment.

## 2017-08-25 NOTE — Telephone Encounter (Signed)
Copied from [place] [phone]. Topic: Quick Communication - See Telephone Encounter >> Aug 25, 2017  8:40 AM Boyd Kerbs wrote: CRM for notification.   [place] [phone] Needing verbal order that Dr. Larose Kells will be following pt. For Nursing, PT, OT, aide, & Education officer, museum.  See Telephone encounter for: 08/25/17.

## 2017-08-26 NOTE — Telephone Encounter (Signed)
thx

## 2017-08-31 ENCOUNTER — Other Ambulatory Visit (HOSPITAL_COMMUNITY): Payer: Self-pay | Admitting: *Deleted

## 2017-08-31 DIAGNOSIS — N130 Hydronephrosis with ureteropelvic junction obstruction: Secondary | ICD-10-CM | POA: Diagnosis not present

## 2017-08-31 DIAGNOSIS — R339 Retention of urine, unspecified: Secondary | ICD-10-CM | POA: Diagnosis not present

## 2017-08-31 MED ORDER — FUROSEMIDE 40 MG PO TABS: ORAL_TABLET | ORAL | 3 refills | Status: DC

## 2017-09-03 ENCOUNTER — Encounter (HOSPITAL_COMMUNITY): Payer: Self-pay

## 2017-09-03 ENCOUNTER — Ambulatory Visit (HOSPITAL_BASED_OUTPATIENT_CLINIC_OR_DEPARTMENT_OTHER)
Admission: RE | Admit: 2017-09-03 | Discharge: 2017-09-03 | Disposition: A | Discharge status: Home / Self Care | Payer: Medicare Other | Source: Ambulatory Visit | Attending: Cardiology | Admitting: Cardiology

## 2017-09-03 VITALS — BP 154/76 | HR 58 | Wt 193.0 lb

## 2017-09-03 DIAGNOSIS — I5032 Chronic diastolic (congestive) heart failure: Secondary | ICD-10-CM | POA: Diagnosis not present

## 2017-09-03 DIAGNOSIS — J9611 Chronic respiratory failure with hypoxia: Secondary | ICD-10-CM | POA: Diagnosis not present

## 2017-09-03 DIAGNOSIS — G4733 Obstructive sleep apnea (adult) (pediatric): Secondary | ICD-10-CM

## 2017-09-03 DIAGNOSIS — M549 Dorsalgia, unspecified: Secondary | ICD-10-CM | POA: Diagnosis not present

## 2017-09-03 DIAGNOSIS — I5042 Chronic combined systolic (congestive) and diastolic (congestive) heart failure: Secondary | ICD-10-CM

## 2017-09-03 DIAGNOSIS — D471 Chronic myeloproliferative disease: Secondary | ICD-10-CM | POA: Diagnosis not present

## 2017-09-03 DIAGNOSIS — N183 Chronic kidney disease, stage 3 unspecified: Secondary | ICD-10-CM

## 2017-09-03 DIAGNOSIS — N138 Other obstructive and reflux uropathy: Secondary | ICD-10-CM | POA: Diagnosis not present

## 2017-09-03 DIAGNOSIS — Z87891 Personal history of nicotine dependence: Secondary | ICD-10-CM

## 2017-09-03 DIAGNOSIS — Z7982 Long term (current) use of aspirin: Secondary | ICD-10-CM

## 2017-09-03 DIAGNOSIS — R339 Retention of urine, unspecified: Secondary | ICD-10-CM

## 2017-09-03 DIAGNOSIS — Z79899 Other long term (current) drug therapy: Secondary | ICD-10-CM

## 2017-09-03 DIAGNOSIS — N390 Urinary tract infection, site not specified: Secondary | ICD-10-CM | POA: Diagnosis not present

## 2017-09-03 DIAGNOSIS — K5730 Diverticulosis of large intestine without perforation or abscess without bleeding: Secondary | ICD-10-CM | POA: Diagnosis not present

## 2017-09-03 DIAGNOSIS — E1122 Type 2 diabetes mellitus with diabetic chronic kidney disease: Secondary | ICD-10-CM

## 2017-09-03 DIAGNOSIS — I272 Pulmonary hypertension, unspecified: Secondary | ICD-10-CM | POA: Insufficient documentation

## 2017-09-03 DIAGNOSIS — Z9111 Patient's noncompliance with dietary regimen: Secondary | ICD-10-CM | POA: Diagnosis not present

## 2017-09-03 DIAGNOSIS — I130 Hypertensive heart and chronic kidney disease with heart failure and stage 1 through stage 4 chronic kidney disease, or unspecified chronic kidney disease: Secondary | ICD-10-CM

## 2017-09-03 DIAGNOSIS — I2510 Atherosclerotic heart disease of native coronary artery without angina pectoris: Secondary | ICD-10-CM

## 2017-09-03 DIAGNOSIS — R109 Unspecified abdominal pain: Secondary | ICD-10-CM | POA: Diagnosis not present

## 2017-09-03 DIAGNOSIS — R410 Disorientation, unspecified: Secondary | ICD-10-CM | POA: Diagnosis not present

## 2017-09-03 DIAGNOSIS — M545 Low back pain: Secondary | ICD-10-CM | POA: Diagnosis not present

## 2017-09-03 LAB — BASIC METABOLIC PANEL
ANION GAP: 11 (ref 5–15)
BUN: 54 mg/dL — ABNORMAL HIGH (ref 6–20)
CO2: 27 mmol/L (ref 22–32)
Calcium: 10 mg/dL (ref 8.9–10.3)
Chloride: 104 mmol/L (ref 101–111)
Creatinine, Ser: 1.17 mg/dL (ref 0.61–1.24)
GFR calc Af Amer: >60 mL/min (ref >60)
GFR, EST NON AFRICAN AMERICAN: 59 mL/min — AB (ref >60)
Glucose, Bld: 102 mg/dL — ABNORMAL HIGH (ref 65–99)
POTASSIUM: 4.3 mmol/L (ref 3.5–5.1)
SODIUM: 142 mmol/L (ref 135–145)

## 2017-09-03 MED ORDER — FUROSEMIDE 40 MG PO TABS: ORAL_TABLET | ORAL | 2 refills | Status: DC

## 2017-09-03 MED ORDER — HYDRALAZINE HCL 25 MG PO TABS: 25.0000 mg | ORAL_TABLET | Freq: Three times a day (TID) | ORAL | 2 refills | Status: DC

## 2017-09-03 MED FILL — FUROSEMIDE 40 MG TAB: 40 | 30 days supply | Qty: 90 | Fill #0

## 2017-09-03 MED FILL — hydrALAZINE HCL 25 MG TABS: 25 | 30 days supply | Qty: 90 | Fill #0

## 2017-09-03 NOTE — Patient Instructions (Signed)
Routine lab work today. Will notify you of abnormal results, otherwise no news is good news!  Follow up 4-6 weeks with Oda Kilts PA-C.  ________________________________________________________ Juan [name] Code: 1200  Take all medication as prescribed the day of your appointment. Bring all medications with you to your appointment.  Do the following things EVERYDAY: 1) Weigh yourself in the morning before breakfast. Write it down and keep it in a log. 2) Take your medicines as prescribed 3) Eat low salt foods-Limit salt (sodium) to 2000 mg per day.  4) Stay as active as you can everyday 5) Limit all fluids for the day to less than 2 liters

## 2017-09-03 NOTE — Progress Notes (Signed)
Patient ID: Juan [name], male   DOB: 1940-10-19, 77 y.o.   MRN: [id number]    Advanced Heart Failure Clinic Note   PCP: Dr Larose Kells  Primary HF Cardiologist: Dr Haroldine Laws  Primary Cardiologist: Dr Johnsie Cancel   HPI: Juan [name] is a 77 y.o. male with history of Diastolic HF, OSA, HTN, DM2, and myeloproliferative disorder followed by Oncology.   He was seen by onc 10/22/14 for his myeloproliferative disorder. 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   Admitted to [facility] 01/2015 with increased dyspnea and volume overload. Diuresed with lasix and transitioned to lasix 40 mg three times a day.   Admitted twice in  December 2017 with volume overload.  Diuresed with IV lasix the transitioned to lasix 40 mg twice a day. Discharge weight 198 pounds. Discharged to SNF.    Admitted 10/08/16-10/17/16 with volume overload. PA pressure on Echo was 85 mmHg. This was a newly high PA pressure for him. VQ scan negative for PE. ANA, RA and HIV negative. He was diuresed with IV lasix about 15 pounds. Discharge weight was 194 pounds. It was recommended that he go to SNF, but he preferred to go home with home health. He also was started on home 02 at 2L.   Admitted earlier 12/2016 with hip and flank pain. Torsemide and metolazone held at discharge. Per note because patient "wasn't in a CHF flare".   Admitted 01/23/17-01/26/17 with hip, back and foot pain. 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 Dr. Marin Olp saw in the hospital and [name] was started. Weight was down to 185 pounds, his lasix was stopped at discharge.   Admitted 05/2017 with lower GI bleed. Had scope with clip placed. Aspirin stopped. Discharged on lasix 40 mg daily. He refused SNF placement.   He has had 6 admission since November 8546  for complicated UTI (1 for GI bleed). He has an indwelling foley and urology follow up. Recently refused PICC line so received one week of daptomycin as inpatient. He was previously in SNF in December, but demanded he be sent home due to PT/OT requirement. He has since refused SNF follow up.   He presents today for post hospital follow up. Feeling better. He is at home. He continues to eat high salt food, but says he cooks them with "honey" to displace the salt. Son stays with him and helps.  He did not take any medications this am due to travelling for his appointments.  He has f/u with urology in early May. He refuses paramedicine or HH. He doesn't want anyone in his house that "doesn't need to be". He denies SOB at home, but admits he is not active, spending the majority of his day seated.   Echo 01/21/15 EF 30%, moderate LVH, mild MR, PA peak pressure 59 mm Hg, Normal RV ECHO 12/2015: Ef 55-60%. Grade IDD Echo 09/2016 EF 55-60%, PA pressure 85 mm Hg.   Review of systems complete and found to be negative unless listed in HPI.    SH:  Social History   Socioeconomic History  . Marital status: Widowed    Spouse name: Not on file  . Number of children: 2  . Years of education: Not on file  . Highest education level: Not on file  Occupational History  . Occupation: retired  Employer: RETIRED  Social Needs  . Financial resource strain: Not on file  . Food insecurity:    Worry: Not on file    Inability: Not on file  . Transportation needs:    Medical: Not on file    Non-medical: Not on file  Tobacco Use  . Smoking status: Former Smoker    Packs/day: 0.25    Years: 12.00    Pack years: 3.00    Types: Cigarettes    Last attempt to quit: 05/18/1966    Years since quitting: 51.3  . Smokeless tobacco: Never Used  . Tobacco comment: quit smoking 45 years ago  Substance and Sexual Activity  . Alcohol use: No    Alcohol/week: 0.0 oz  . Drug use: No  . Sexual activity: Yes  Lifestyle  .  Physical activity:    Days per week: Not on file    Minutes per session: Not on file  . Stress: Not on file  Relationships  . Social connections:    Talks on phone: Not on file    Gets together: Not on file    Attends religious service: Not on file    Active member of club or organization: Not on file    Attends meetings of clubs or organizations: Not on file    Relationship status: Not on file  . Intimate partner violence:    Fear of current or ex partner: Not on file    Emotionally abused: Not on file    Physically abused: Not on file    Forced sexual activity: Not on file  Other Topics Concern  . Not on file  Social History Narrative   Moved from [place] 2006   Widow 2007   Son lives with him       Admitted to [facility]08[facility]17   Former smoker-stopped 1968   Alcohol none   Full Code   FH: Family History  Problem Relation Age of Onset  . Schizophrenia Son   . Mental illness Daughter   . Colon cancer Neg Hx   . Prostate cancer Neg Hx   . Heart attack Neg Hx   . Diabetes Neg Hx     Past Medical History:  Diagnosis Date  . Allergic rhinitis   . Anemia   . Ascending aortic aneurysm ([place]) 03/2014   4.3cm on CT scan  . CAD (coronary artery disease)    dx elsewheer in past, no documentation. Non-ischemic myoview 2007  . Chronic diastolic CHF (congestive heart failure), NYHA class 2 (HCC)    Normal EF w/ grade 1 dd by echo 12/2015  . Chronic respiratory failure with hypoxia/2L at home    "2L; all the time" (05/03/2017)  . CKD (chronic kidney disease), stage III ([place])   . Edema    R>L leg, u/s 5-12 neg for DVT  . Hemorrhoid   . History of hydronephrosis   . History of Splenic infarct   . History of thrombocytosis   . Hypertension   . Iron deficiency anemia due to chronic blood loss 03/31/2017  . 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 Migraine    "once/wk at least" (07/11/2013)  . Moderate to severe pulmonary hypertension ([place])   . Myeloproliferative  disease ([place])   . Pyelonephritis 04/29/2017  . Recurrent Pseudomonas urinary tract infection   . Shortness of breath   . Sinus congestion   . Sleep apnea, obstructive    at some point used CPAP, was d/c  years ago  .  Type II diabetes mellitus ([place])   . UTI (urinary tract infection) 06/2017  . UTI (urinary tract infection) 07/2017    Current Outpatient Medications  Medication Sig Dispense Refill  . acetaminophen (TYLENOL) 325 MG tablet Take 2 tablets (650 mg total) by mouth every 6 (six) hours as needed for mild pain (or Fever >/= 101).    Marland Kitchen aspirin EC 81 MG tablet Take 81 mg by mouth daily.    Marland Kitchen b complex vitamins tablet Take 1 tablet by mouth daily.    . benzonatate (TESSALON) 100 MG capsule Take 1 capsule (100 mg total) by mouth 2 (two) times daily as needed for cough. 20 capsule 0  . ferrous gluconate (FERGON) 240 (27 FE) MG tablet Take 240 mg by mouth daily.    . furosemide (LASIX) 40 MG tablet Take '80mg'$  (2tabs) in morning and '40mg'$  (1tab) in evening 90 tablet 3  . hydrALAZINE (APRESOLINE) 25 MG tablet Take 25 mg by mouth 3 (three) times daily.     . OXYGEN Inhale 2 L into the lungs continuous.    . tamsulosin (FLOMAX) 0.4 MG CAPS capsule Take 1 capsule (0.4 mg total) by mouth daily after supper. 30 capsule 0   No current facility-administered medications for this encounter.     Vitals:   09/03/17 0921  BP: (!) 154/76  Pulse: (!) 58  SpO2: 98%  Weight: 193 lb (87.5 kg)   Wt Readings from Last 3 Encounters:  09/03/17 193 lb (87.5 kg)  08/23/17 179 lb 3.2 oz (81.3 kg)  08/04/17 178 lb 12.7 oz (81.1 kg)    PHYSICAL EXAM: General: Elderly. Chronically ill appearing. No resp difficulty. HEENT: Normal Neck: Supple. JVP 8-9 cm. Carotids 2+ bilat; no bruits. No thyromegaly or nodule noted. Cor: PMI nondisplaced. RRR, No M/G/R noted Lungs: CTAB, normal effort. Abdomen: Soft, non-tender, non-distended, no HSM. No bruits or masses. +BS  Extremities: No cyanosis, clubbing, or  rash. 1-2+ edema.  Neuro: Alert & orientedx3, cranial nerves grossly intact. moves all 4 extremities w/o difficulty. Affect pleasant   ASSESSMENT & PLAN: 1. Chronic combined systolic and diastolic CHF, EF 82% echo 01/21/15, Normal myoview 2007.  EF now 55-60% in 09/2016. - Volume status stable to mildly elevated - Continue lasix 80 mg q am and 40 mg q pm for now. Should take extra tab in evening as needed.  - Reinforced fluid restriction to < 2 L daily, sodium restriction to less than 2000 mg daily, and the importance of daily weights.   - He continues to refuse any help in the home.   2. CKD stage III - BMET today.   3. HTN - Didn't take any meds this am. No change.   4. 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 - Now seeing Dr. Marin Olp. 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  - No change to current plan.    5. H/O GI Bleed  - Denies further bleeding.  - S/P sigmoidoscopy with clip placed 05/2017   6. Chronic urinary retention - Foley in place. Sees urology in several weeks.  - Recently treated with IV ABX.   Labs today. RTC 6 weeks. Sooner with symptoms. We have tried multiple avenues to reduce admission rate, most notable pt refuses any inside the home help.  He remains at high risk for decompensation and re-admission.  Will ask HFSW to follow up with him next week.   Shirley Friar, PA-C  09/03/17   Greater than 50% of  the 25 minute visit was spent in counseling/coordination of care regarding disease state education, salt/fluid restriction, sliding scale diuretics, and medication compliance.

## 2017-09-04 DIAGNOSIS — R509 Fever, unspecified: Secondary | ICD-10-CM | POA: Diagnosis not present

## 2017-09-05 ENCOUNTER — Inpatient Hospital Stay (HOSPITAL_COMMUNITY)
Admission: EM | Admit: 2017-09-05 | Discharge: 2017-09-12 | DRG: 841 | Disposition: A | Discharge status: Home / Self Care | Payer: Medicare Other | Attending: Family Medicine | Admitting: Family Medicine

## 2017-09-05 ENCOUNTER — Emergency Department (HOSPITAL_COMMUNITY): Payer: Medicare Other

## 2017-09-05 ENCOUNTER — Other Ambulatory Visit: Payer: Self-pay

## 2017-09-05 ENCOUNTER — Encounter (HOSPITAL_COMMUNITY): Payer: Self-pay

## 2017-09-05 DIAGNOSIS — M545 Low back pain, unspecified: Secondary | ICD-10-CM

## 2017-09-05 DIAGNOSIS — Z87891 Personal history of nicotine dependence: Secondary | ICD-10-CM

## 2017-09-05 DIAGNOSIS — I5032 Chronic diastolic (congestive) heart failure: Secondary | ICD-10-CM | POA: Diagnosis not present

## 2017-09-05 DIAGNOSIS — N189 Chronic kidney disease, unspecified: Secondary | ICD-10-CM | POA: Diagnosis not present

## 2017-09-05 DIAGNOSIS — N183 Chronic kidney disease, stage 3 unspecified: Secondary | ICD-10-CM | POA: Diagnosis present

## 2017-09-05 DIAGNOSIS — Z79899 Other long term (current) drug therapy: Secondary | ICD-10-CM

## 2017-09-05 DIAGNOSIS — Z9981 Dependence on supplemental oxygen: Secondary | ICD-10-CM | POA: Diagnosis not present

## 2017-09-05 DIAGNOSIS — R0602 Shortness of breath: Secondary | ICD-10-CM

## 2017-09-05 DIAGNOSIS — I130 Hypertensive heart and chronic kidney disease with heart failure and stage 1 through stage 4 chronic kidney disease, or unspecified chronic kidney disease: Secondary | ICD-10-CM | POA: Diagnosis present

## 2017-09-05 DIAGNOSIS — N138 Other obstructive and reflux uropathy: Secondary | ICD-10-CM | POA: Diagnosis present

## 2017-09-05 DIAGNOSIS — D631 Anemia in chronic kidney disease: Secondary | ICD-10-CM | POA: Diagnosis not present

## 2017-09-05 DIAGNOSIS — R1031 Right lower quadrant pain: Secondary | ICD-10-CM | POA: Diagnosis present

## 2017-09-05 DIAGNOSIS — Z87440 Personal history of urinary (tract) infections: Secondary | ICD-10-CM | POA: Diagnosis not present

## 2017-09-05 DIAGNOSIS — N401 Enlarged prostate with lower urinary tract symptoms: Secondary | ICD-10-CM | POA: Diagnosis present

## 2017-09-05 DIAGNOSIS — D638 Anemia in other chronic diseases classified elsewhere: Secondary | ICD-10-CM | POA: Diagnosis present

## 2017-09-05 DIAGNOSIS — Z91199 Patient's noncompliance with other medical treatment and regimen due to unspecified reason: Secondary | ICD-10-CM

## 2017-09-05 DIAGNOSIS — C946 Myelodysplastic disease, not classified: Secondary | ICD-10-CM | POA: Diagnosis not present

## 2017-09-05 DIAGNOSIS — N390 Urinary tract infection, site not specified: Secondary | ICD-10-CM

## 2017-09-05 DIAGNOSIS — J80 Acute respiratory distress syndrome: Secondary | ICD-10-CM | POA: Diagnosis not present

## 2017-09-05 DIAGNOSIS — I272 Pulmonary hypertension, unspecified: Secondary | ICD-10-CM | POA: Diagnosis present

## 2017-09-05 DIAGNOSIS — Z638 Other specified problems related to primary support group: Secondary | ICD-10-CM

## 2017-09-05 DIAGNOSIS — R109 Unspecified abdominal pain: Secondary | ICD-10-CM | POA: Diagnosis not present

## 2017-09-05 DIAGNOSIS — E1151 Type 2 diabetes mellitus with diabetic peripheral angiopathy without gangrene: Secondary | ICD-10-CM | POA: Diagnosis present

## 2017-09-05 DIAGNOSIS — E1122 Type 2 diabetes mellitus with diabetic chronic kidney disease: Secondary | ICD-10-CM | POA: Diagnosis present

## 2017-09-05 DIAGNOSIS — Z9119 Patient's noncompliance with other medical treatment and regimen: Secondary | ICD-10-CM

## 2017-09-05 DIAGNOSIS — R338 Other retention of urine: Secondary | ICD-10-CM | POA: Diagnosis present

## 2017-09-05 DIAGNOSIS — M549 Dorsalgia, unspecified: Secondary | ICD-10-CM | POA: Diagnosis not present

## 2017-09-05 DIAGNOSIS — R410 Disorientation, unspecified: Secondary | ICD-10-CM

## 2017-09-05 DIAGNOSIS — I2510 Atherosclerotic heart disease of native coronary artery without angina pectoris: Secondary | ICD-10-CM | POA: Diagnosis present

## 2017-09-05 DIAGNOSIS — Z9111 Patient's noncompliance with dietary regimen: Secondary | ICD-10-CM

## 2017-09-05 DIAGNOSIS — D72829 Elevated white blood cell count, unspecified: Secondary | ICD-10-CM | POA: Diagnosis not present

## 2017-09-05 DIAGNOSIS — Z598 Other problems related to housing and economic circumstances: Secondary | ICD-10-CM | POA: Diagnosis not present

## 2017-09-05 DIAGNOSIS — J9611 Chronic respiratory failure with hypoxia: Secondary | ICD-10-CM | POA: Diagnosis present

## 2017-09-05 DIAGNOSIS — D471 Chronic myeloproliferative disease: Secondary | ICD-10-CM | POA: Diagnosis present

## 2017-09-05 DIAGNOSIS — N323 Diverticulum of bladder: Secondary | ICD-10-CM | POA: Diagnosis present

## 2017-09-05 DIAGNOSIS — R339 Retention of urine, unspecified: Secondary | ICD-10-CM | POA: Diagnosis present

## 2017-09-05 DIAGNOSIS — G4733 Obstructive sleep apnea (adult) (pediatric): Secondary | ICD-10-CM | POA: Diagnosis present

## 2017-09-05 DIAGNOSIS — Z7982 Long term (current) use of aspirin: Secondary | ICD-10-CM

## 2017-09-05 DIAGNOSIS — K5730 Diverticulosis of large intestine without perforation or abscess without bleeding: Secondary | ICD-10-CM | POA: Diagnosis not present

## 2017-09-05 DIAGNOSIS — R52 Pain, unspecified: Secondary | ICD-10-CM

## 2017-09-05 LAB — URINALYSIS, ROUTINE W REFLEX MICROSCOPIC
Bilirubin Urine: NEGATIVE
Glucose, UA: NEGATIVE mg/dL
KETONES UR: NEGATIVE mg/dL
Nitrite: NEGATIVE
PH: 7 (ref 5.0–8.0)
Protein, ur: 100 mg/dL — AB
Specific Gravity, Urine: 1.01 (ref 1.005–1.030)

## 2017-09-05 LAB — BASIC METABOLIC PANEL
ANION GAP: 12 (ref 5–15)
BUN: 48 mg/dL — ABNORMAL HIGH (ref 6–20)
CO2: 25 mmol/L (ref 22–32)
Calcium: 9.6 mg/dL (ref 8.9–10.3)
Chloride: 102 mmol/L (ref 101–111)
Creatinine, Ser: 1.23 mg/dL (ref 0.61–1.24)
GFR, EST NON AFRICAN AMERICAN: 56 mL/min — AB (ref >60)
Glucose, Bld: 93 mg/dL (ref 65–99)
POTASSIUM: 4.2 mmol/L (ref 3.5–5.1)
Sodium: 139 mmol/L (ref 135–145)

## 2017-09-05 LAB — CBC WITH DIFFERENTIAL/PLATELET
BASOS ABS: 0.4 10*3/uL — AB (ref 0.0–0.1)
BASOS PCT: 3 %
EOS ABS: 0.1 10*3/uL (ref 0.0–0.7)
Eosinophils Relative: 1 %
HCT: 31 % — ABNORMAL LOW (ref 39.0–52.0)
Hemoglobin: 9.1 g/dL — ABNORMAL LOW (ref 13.0–17.0)
LYMPHS PCT: 12 %
Lymphs Abs: 1.5 10*3/uL (ref 0.7–4.0)
MCH: 25.5 pg — ABNORMAL LOW (ref 26.0–34.0)
MCHC: 29.4 g/dL — ABNORMAL LOW (ref 30.0–36.0)
MCV: 86.8 fL (ref 78.0–100.0)
Monocytes Absolute: 3 10*3/uL — ABNORMAL HIGH (ref 0.1–1.0)
Monocytes Relative: 24 %
NEUTROS PCT: 60 %
Neutro Abs: 7.7 10*3/uL (ref 1.7–7.7)
PLATELETS: 209 10*3/uL (ref 150–400)
RBC: 3.57 MIL/uL — AB (ref 4.22–5.81)
RDW: 17 % — ABNORMAL HIGH (ref 11.5–15.5)
WBC: 12.7 10*3/uL — ABNORMAL HIGH (ref 4.0–10.5)

## 2017-09-05 LAB — GLUCOSE, CAPILLARY: Glucose-Capillary: 112 mg/dL — ABNORMAL HIGH (ref 65–99)

## 2017-09-05 MED ORDER — TAMSULOSIN HCL 0.4 MG PO CAPS
0.4000 mg | ORAL_CAPSULE | Freq: Every day | ORAL | Status: DC
  Administered 2017-09-05 – 2017-09-12 (×8): 0.4 mg via ORAL
  Filled 2017-09-05 (×8): qty 1

## 2017-09-05 MED ORDER — SODIUM CHLORIDE 0.9 % IV SOLN
1.0000 g | Freq: Once | INTRAVENOUS | Status: AC
  Administered 2017-09-05: 1 g via INTRAVENOUS
  Filled 2017-09-05: qty 1

## 2017-09-05 MED ORDER — FUROSEMIDE 20 MG PO TABS: 40.0000 mg | ORAL_TABLET | ORAL | Status: DC

## 2017-09-05 MED ORDER — ACETAMINOPHEN 325 MG PO TABS
650.0000 mg | ORAL_TABLET | Freq: Four times a day (QID) | ORAL | Status: DC | PRN
  Administered 2017-09-06 – 2017-09-12 (×9): 650 mg via ORAL
  Filled 2017-09-05 (×11): qty 2

## 2017-09-05 MED ORDER — SODIUM CHLORIDE 0.9 % IV SOLN
1.0000 g | Freq: Three times a day (TID) | INTRAVENOUS | Status: DC
  Administered 2017-09-05 – 2017-09-08 (×8): 1 g via INTRAVENOUS
  Filled 2017-09-05 (×9): qty 1

## 2017-09-05 MED ORDER — ONDANSETRON HCL 4 MG PO TABS: 4.0000 mg | ORAL_TABLET | Freq: Four times a day (QID) | ORAL | Status: DC | PRN

## 2017-09-05 MED ORDER — HYDRALAZINE HCL 25 MG PO TABS
25.0000 mg | ORAL_TABLET | Freq: Three times a day (TID) | ORAL | Status: DC
  Administered 2017-09-05 – 2017-09-12 (×22): 25 mg via ORAL
  Filled 2017-09-05 (×22): qty 1

## 2017-09-05 MED ORDER — ONDANSETRON HCL 4 MG/2ML IJ SOLN: 4.0000 mg | Freq: Four times a day (QID) | INTRAMUSCULAR | Status: DC | PRN

## 2017-09-05 MED ORDER — ASPIRIN EC 81 MG PO TBEC
81.0000 mg | DELAYED_RELEASE_TABLET | Freq: Every day | ORAL | Status: DC
  Administered 2017-09-05 – 2017-09-12 (×8): 81 mg via ORAL
  Filled 2017-09-05 (×8): qty 1

## 2017-09-05 MED ORDER — FERROUS GLUCONATE 324 (38 FE) MG PO TABS
324.0000 mg | ORAL_TABLET | Freq: Every day | ORAL | Status: DC
  Administered 2017-09-06 – 2017-09-12 (×7): 324 mg via ORAL
  Filled 2017-09-05 (×9): qty 1

## 2017-09-05 MED ORDER — ENOXAPARIN SODIUM 40 MG/0.4ML ~~LOC~~ SOLN
40.0000 mg | SUBCUTANEOUS | Status: DC
  Administered 2017-09-05 – 2017-09-07 (×3): 40 mg via SUBCUTANEOUS
  Filled 2017-09-05 (×4): qty 0.4

## 2017-09-05 MED ORDER — FUROSEMIDE 40 MG PO TABS
40.0000 mg | ORAL_TABLET | Freq: Every day | ORAL | Status: DC
  Administered 2017-09-05 – 2017-09-12 (×8): 40 mg via ORAL
  Filled 2017-09-05 (×9): qty 1

## 2017-09-05 MED ORDER — B COMPLEX-C PO TABS
1.0000 | ORAL_TABLET | Freq: Every day | ORAL | Status: DC
  Administered 2017-09-05 – 2017-09-12 (×8): 1 via ORAL
  Filled 2017-09-05 (×10): qty 1

## 2017-09-05 MED ORDER — FUROSEMIDE 80 MG PO TABS
80.0000 mg | ORAL_TABLET | Freq: Every day | ORAL | Status: DC
  Administered 2017-09-06 – 2017-09-12 (×7): 80 mg via ORAL
  Filled 2017-09-05 (×2): qty 1
  Filled 2017-09-05: qty 2
  Filled 2017-09-05 (×4): qty 1

## 2017-09-05 NOTE — H&P (Signed)
History and Physical    Juan [name] FTD:[id number] DOB: [birth year] DOA: 09/05/2017  Referring MD/NP/PA: er PCP: Colon Branch, MD Outpatient Specialists:  Patient coming from: home  Chief Complaint: fever/chills/flank pain  HPI: Juan [name] is a 77 y.o. 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  Recently discharged on 4/9 after AMS due to ? Infection blood vs urine (culture did not grow anything) and was treated with daptomycin.  He comes back after refusing SNF and home health with flank pain as well as subjective fever and chills.   In the last 6 months, patient has had 6 admission.  At one point in March, patient was deemed not to be able to make the medical decision to refuse PICC.  Patient was discharged home with [name] (who has Juan)  Does have some chronic shortness of breath and wears 2L.  Does not follow a low salt diet.  Says he can walk several blocks with his [name].  At home he has a wheelchair.  In the ER, his WBC count was elevated with left shift.  U/A was had large LE, and WBCs too numerous to count.  ER PA spoke with pharmacy and plan is to start [facility].      Review of Systems: all systems reviewed, negative unless stated above in HPI   Past Medical History:  Diagnosis Date  . Allergic rhinitis   . Anemia   . Ascending aortic aneurysm ([place]) 03/2014   4.3cm on CT scan  . CAD (coronary artery disease)    dx elsewheer in past, no documentation. Non-ischemic myoview 2007  . Chronic diastolic CHF (congestive heart failure), NYHA class 2 (HCC)    Normal EF w/ grade 1 dd by echo 12/2015  . Chronic respiratory failure with hypoxia/2L at home    "2L; all the time" (05/03/2017)  . CKD (chronic kidney disease), stage III ([place])   . Edema    R>L leg, u/s 5-12 neg for DVT  . Hemorrhoid   . History of hydronephrosis   . History of Splenic infarct   . History of thrombocytosis   . Hypertension   . Iron deficiency anemia due  to chronic blood loss 03/31/2017  . 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 Migraine    "once/wk at least" (07/11/2013)  . Moderate to severe pulmonary hypertension ([place])   . Myeloproliferative disease ([place])   . Pyelonephritis 04/29/2017  . Recurrent Pseudomonas urinary tract infection   . Shortness of breath   . Sinus congestion   . Sleep apnea, obstructive    at some point used CPAP, was d/c  years ago  . Type II diabetes mellitus ([place])   . UTI (urinary tract infection) 06/2017  . UTI (urinary tract infection) 07/2017    Past Surgical History:  Procedure Laterality Date  . FLEXIBLE SIGMOIDOSCOPY N/A 06/08/2017   Procedure: FLEXIBLE SIGMOIDOSCOPY;  Surgeon: Carol Ada, MD;  Location: [facility];  Service: Endoscopy;  Laterality: N/A;  . IR FLUORO GUIDE CV LINE RIGHT  05/07/2017  . IR FLUORO RM 30-60 MIN  05/07/2017  . IR US GUIDE VASC ACCESS RIGHT  05/07/2017  . TOE SURGERY Right    "tried to straighten out big toe" (07/11/2013)     reports that he quit smoking about 51 years ago. His smoking use included cigarettes. He has a 3.00 pack-year smoking history. He has never used smokeless tobacco. He reports that he does not drink alcohol or use drugs.  No  Known Allergies  Family History  Problem Relation Age of Onset  . Juan [name]   . Juan [name]   . Colon cancer Neg Hx   . Prostate cancer Neg Hx   . Heart attack Neg Hx   . Diabetes Neg Hx      Prior to Admission medications   Medication Sig Start Date End Date Taking? Authorizing Provider  acetaminophen (TYLENOL) 325 MG tablet Take 2 tablets (650 mg total) by mouth every 6 (six) hours as needed for mild pain (or Fever >/= 101). 05/08/17  Yes Rai, Ripudeep K, MD  aspirin EC 81 MG tablet Take 81 mg by mouth daily.   Yes [provider]  b complex vitamins tablet Take 1 tablet by mouth daily.   Yes [provider]  ferrous gluconate (FERGON) 240 (27 FE) MG tablet Take 240 mg by mouth daily.    Yes [provider]  furosemide (LASIX) 40 MG tablet Take 80mg  (2tabs) in morning and 40mg  (1tab) in evening Patient taking differently: Take 40-80 mg by mouth See admin instructions. Take 80mg  (2tabs) in morning and 40mg  (1tab) in evening 09/03/17  Yes Tillery, Satira Mccallum, PA-C  hydrALAZINE (APRESOLINE) 25 MG tablet Take 1 tablet (25 mg total) by mouth 3 (three) times daily. 09/03/17  Yes Shirley Friar, PA-C  tamsulosin (FLOMAX) 0.4 MG CAPS capsule Take 1 capsule (0.4 mg total) by mouth daily after supper. 06/13/17  Yes Bonnielee Haff, MD  benzonatate (TESSALON) 100 MG capsule Take 1 capsule (100 mg total) by mouth 2 (two) times daily as needed for cough. Patient not taking: Reported on 09/05/2017 07/22/17   Modena Jansky, MD  OXYGEN Inhale 2 L into the lungs continuous.    [provider]    Physical Exam: Vitals:   09/05/17 0930 09/05/17 0945 09/05/17 1005 09/05/17 1156  BP: (!) 146/65 (!) 150/70 (!) 141/67 139/84  Pulse:   70 82  Resp:   16 (!) 22  Temp:      TempSrc:      SpO2:   99% (!) 86%  Weight:      Height:          Constitutional: NAD, calm, comfortable Vitals:   09/05/17 0930 09/05/17 0945 09/05/17 1005 09/05/17 1156  BP: (!) 146/65 (!) 150/70 (!) 141/67 139/84  Pulse:   70 82  Resp:   16 (!) 22  Temp:      TempSrc:      SpO2:   99% (!) 86%  Weight:      Height:       Eyes: PERRL, lids and conjunctivae normal ENMT: Mucous membranes are moist. Posterior pharynx clear of any exudate or lesions.Normal dentition.  Neck: normal, supple, no masses, no thyromegaly Respiratory: on 2L chronically, no increased work of breathing Cardiovascular: rrr Abdomen: +Bs, soft Musculoskeletal: +LE edema in b/l LE  Skin: chronic changes in b/l LE Neurologic: moves all 4 ext, LE ext weakner Psychiatric: alert-- poor insight into medical issues and level of help needed    Labs on Admission: I have personally reviewed following labs and imaging  studies  CBC: Recent Labs  Lab 09/05/17 0732  WBC 12.7*  NEUTROABS 7.7  HGB 9.1*  HCT 31.0*  MCV 86.8  PLT 263   Basic Metabolic Panel: Recent Labs  Lab 09/03/17 1008 09/05/17 0732  NA 142 139  K 4.3 4.2  CL 104 102  CO2 27 25  GLUCOSE 102* 93  BUN 54* 48*  CREATININE 1.17 1.23  CALCIUM 10.0 9.6   GFR: Estimated Creatinine Clearance: 62 mL/min (by C-G formula based on SCr of 1.23 mg/dL). Liver Function Tests: No results for input(s): AST, ALT, ALKPHOS, BILITOT, PROT, ALBUMIN in the last 168 hours. No results for input(s): LIPASE, AMYLASE in the last 168 hours. No results for input(s): AMMONIA in the last 168 hours. Coagulation Profile: No results for input(s): INR, PROTIME in the last 168 hours. Cardiac Enzymes: No results for input(s): CKTOTAL, CKMB, CKMBINDEX, TROPONINI in the last 168 hours. BNP (last 3 results) No results for input(s): PROBNP in the last 8760 hours. HbA1C: No results for input(s): HGBA1C in the last 72 hours. CBG: No results for input(s): GLUCAP in the last 168 hours. Lipid Profile: No results for input(s): CHOL, HDL, LDLCALC, TRIG, CHOLHDL, LDLDIRECT in the last 72 hours. Thyroid Function Tests: No results for input(s): TSH, T4TOTAL, FREET4, T3FREE, THYROIDAB in the last 72 hours. Anemia Panel: No results for input(s): VITAMINB12, FOLATE, FERRITIN, TIBC, IRON, RETICCTPCT in the last 72 hours. Urine analysis:    Component Value Date/Time   COLORURINE YELLOW 09/05/2017 0108   APPEARANCEUR HAZY (A) 09/05/2017 0108   LABSPEC 1.010 09/05/2017 0108   PHURINE 7.0 09/05/2017 0108   GLUCOSEU NEGATIVE 09/05/2017 0108   GLUCOSEU NEGATIVE 05/02/2015 1008   HGBUR SMALL (A) 09/05/2017 0108   BILIRUBINUR NEGATIVE 09/05/2017 0108   KETONESUR NEGATIVE 09/05/2017 0108   PROTEINUR 100 (A) 09/05/2017 0108   UROBILINOGEN 0.2 05/02/2015 1008   NITRITE NEGATIVE 09/05/2017 0108   LEUKOCYTESUR LARGE (A) 09/05/2017 0108   Sepsis Labs: Invalid input(s):  PROCALCITONIN, LACTICIDVEN No results found for this or any previous visit (from the past 240 hour(s)).   Radiological Exams on Admission: Ct Renal Stone Study  Result Date: 09/05/2017 CLINICAL DATA:  77 year old male with right flank and bladder pain. 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 EXAM: CT ABDOMEN AND PELVIS WITHOUT CONTRAST TECHNIQUE: Multidetector CT imaging of the abdomen and pelvis was performed following the standard protocol without IV contrast. COMPARISON:  Recent prior CT abdomen/pelvis 08/18/2017; contrast-enhanced CT scan of the abdomen and pelvis 06/07/2017 FINDINGS: Lower chest: Mild dependent atelectasis bilaterally. Otherwise, no acute abnormality within the visualized lower chest. Hepatobiliary: Normal hepatic contour and morphology. No discrete hepatic lesions. Normal appearance of the gallbladder. No intra or extrahepatic biliary ductal dilatation. Pancreas: Unremarkable. No pancreatic ductal dilatation or surrounding inflammatory changes. Spleen: Normal in size without focal abnormality. Adrenals/Urinary Tract: Normal adrenal glands. Stable simple cyst arising from the lower pole of the right kidney. No evidence of hydronephrosis or nephrolithiasis. The ureters are unremarkable. The bladder is diffusely thick walled. A Foley catheter is in place. Multiple bladder diverticula. The largest measures 4.2 x 4.0 cm. Stomach/Bowel: No focal bowel wall thickening or evidence of obstruction. Colonic diverticulosis without evidence of active diverticulitis. Vascular/Lymphatic: Limited evaluation in the absence of intravenous contrast. Atherosclerotic calcifications present along the abdominal aorta. No aneurysm. No suspicious lymphadenopathy. Reproductive: Prostate is unremarkable. Other: No abdominal wall hernia or abnormality. No abdominopelvic ascites. Musculoskeletal: Diffusely abnormal osseous structures with heterogeneous sclerosis and innumerable small  lucent lesions throughout all visualized bones. These findings are essentially unchanged over the past several studies. IMPRESSION: 1. No acute abnormality within the abdomen or pelvis. 2. Foley catheter well positioned in the bladder. The bladder is diffusely thick walled and contains multiple diverticula. Wall thickening could reflect chronic trabeculation, or acute or chronic cystitis. If the diverticula are incompletely emptying, this could provide a source  for urinary stasis and bacterial overgrowth. 3. Colonic diverticular disease without CT evidence of active inflammation. 4. Diffusely abnormal osseous structures with heterogeneous sclerosis and innumerable small lucent lesions. 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 Imaging findings are unchanged across multiple prior studies. Electronically Signed   By: Jacqulynn Cadet M.D.   On: 09/05/2017 09:17      Assessment/Plan Active Problems:   Poor compliance with advise    Leukocytosis   CKD (chronic kidney disease) stage 3, GFR 30-59 ml/min (HCC)   Chronic diastolic CHF (congestive heart failure), NYHA class 2 (HCC)   UTI (urinary tract infection)   complicated UTI (last culture that was positive grew pseudomonas) -patient reports fever and chills -culture pending -will treat with fortaz per pharmacy -has foley catheter due to issues with prostate and urinary diverticulum not allowing him to empty bladder--- urology does not plan to remove foley until patient more ambulatory/rehabed ([place]) -has appointment in May with urology per patient -changed 2-3 weeks ago per patient  leukocytosis -mild elevation with left shift -abx  Chronic resp symptoms -on 2L home O2  Chronic diastolic CHF -some LE edema-- from skin appears to be improved from baseline -continue home lasix -daily BMP to monitor Cr  Myelodysplasia: On EPO. - Follow up with Dr. Marin Olp, heme/onc  OSA:  - CPAP qHS  Poor compliance with advise  and poor home situation -during an admission in March, patient was deemed not to be able to make decision regarding PICC Line but was d/c'd home -has been seen by palliative care numerous times in the past-- last 01/2017     DVT prophylaxis: lovenox Code Status: full Family Communication: none at bedside Disposition Plan: pending-- does not seem to be able to manage at home Consults called: Admission status: [place] [facility] Pager [phone]  If 7PM-7AM, please contact night-coverage [email or web address] Password [email or web address]  09/05/2017, 2:06 PM

## 2017-09-05 NOTE — ED Notes (Signed)
Meal tray ordered for pt  

## 2017-09-05 NOTE — ED Notes (Signed)
Pt continues to request meal tray, this RN explained to pt that tray has already been ordered but would be happy to provide a snack in the meantime. Pt refused sandwich, applesauce, and peanut butter and crackers stating "I don't want any of that I want my tray from downstairs I'm starving!"

## 2017-09-05 NOTE — ED Notes (Signed)
Ordered breakfast. Called and sent email

## 2017-09-05 NOTE — Progress Notes (Signed)
Pharmacy Antibiotic Note  Carmelo Reidel is a 77 y.o. male admitted on 09/05/2017 with recurrent UTIs.  Based on history, patient grew Pseudomonas several times.  She has been on Rocephin, Levaquin, cefepime and Zosyn.  Pharmacy has been consulted for [facility] dosing.  Baseline labs reviewed.  Plan: Tressie Ellis 1gm IV Q8H Monitor renal fxn, micro data   Height: 6\' 3"  (190.5 cm) Weight: 193 lb (87.5 kg) IBW/kg (Calculated) : 84.5  Temp (24hrs), Avg:97.7 F (36.5 C), Min:97.7 F (36.5 C), Max:97.7 F (36.5 C)  Recent Labs  Lab 09/03/17 1008 09/05/17 0732  WBC  --  12.7*  CREATININE 1.17 1.23    Estimated Creatinine Clearance: 62 mL/min (by C-G formula based on SCr of 1.23 mg/dL).    No Known Allergies   Tressie Ellis 4/21 >>  4/21 UCx -    Eleaner Dibartolo D. Mina Marble, PharmD, BCPS, [place] Pager:  [phone] 09/05/2017, 2:13 PM

## 2017-09-05 NOTE — ED Provider Notes (Signed)
[facility] EMERGENCY DEPARTMENT Provider Note   CSN: [id number] Arrival date & time: 09/05/17  0055     History   Chief Complaint Chief Complaint  Patient presents with  . Recurrent UTI    HPI Juan [name] is a 77 y.o. male.  The history is provided by the patient. No language interpreter was used.  Pt complains of pain in his back.   Pt points to right side as area of discomfort.  Pt admits trouble getting around due to the pain.  Pt thinks he may have infection in his urine.   Pt says he has a foley because of infection.   Pt reports he thinks he was suppose to see a urologist but he has not seen.  Pt reports he thinks he gets to get foley out if infection goes away.  (Pt's history varies)   Past Medical History:  Diagnosis Date  . Allergic rhinitis   . Anemia   . Ascending aortic aneurysm ([place]) 03/2014   4.3cm on CT scan  . CAD (coronary artery disease)    dx elsewheer in past, no documentation. Non-ischemic myoview 2007  . Chronic diastolic CHF (congestive heart failure), NYHA class 2 (HCC)    Normal EF w/ grade 1 dd by echo 12/2015  . Chronic respiratory failure with hypoxia/2L at home    "2L; all the time" (05/03/2017)  . CKD (chronic kidney disease), stage III ([place])   . Edema    R>L leg, u/s 5-12 neg for DVT  . Hemorrhoid   . History of hydronephrosis   . History of Splenic infarct   . History of thrombocytosis   . Hypertension   . Iron deficiency anemia due to chronic blood loss 03/31/2017  . 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 Migraine    "once/wk at least" (07/11/2013)  . Moderate to severe pulmonary hypertension ([place])   . Myeloproliferative disease ([place])   . Pyelonephritis 04/29/2017  . Recurrent Pseudomonas urinary tract infection   . Shortness of breath   . Sinus congestion   . Sleep apnea, obstructive    at some point used CPAP, was d/c  years ago  . Type II diabetes mellitus ([place])   . UTI (urinary tract infection) 06/2017  . UTI  (urinary tract infection) 07/2017    Patient Active Problem List   Diagnosis Date Noted  . VRE (vancomycin resistant enterococcus) culture positive   . Staphylococcus epidermidis infection   . Acute encephalopathy 08/18/2017  . Sleep apnea, obstructive 08/18/2017  . 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 History of recurrent Pseudomonas urinary tract infection 08/18/2017  . Moderate to severe pulmonary hypertension ([place]) 08/18/2017  . CKD (chronic kidney disease), stage III ([place]) 08/18/2017  . Chronic respiratory failure with hypoxia ([place]) 08/18/2017  . Chronic diastolic CHF (congestive heart failure), NYHA class 2 ([place]) 08/18/2017  . 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 HTN (hypertension) 08/18/2017  . Evaluation by psychiatric service required   . Pyelonephritis 07/17/2017  . Recurrent UTI 07/16/2017  . Acute exacerbation of CHF (congestive heart failure) ([place]) 07/06/2017  . Complicated UTI (urinary tract infection) 07/06/2017  . Acute GI bleeding 06/07/2017  . Hypokalemia   . Iron deficiency anemia due to chronic blood loss 03/31/2017  . 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 Urinary retention 01/24/2017  . 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 Elevated troponin 11/14/2016  . Recurrent pneumonia 10/31/2016  . Pulmonary hypertension severe   . Sprain of left rotator cuff  capsule   . Hyperkalemia   . Paroxysmal SVT (supraventricular tachycardia) ([place]) 04/20/2016  . Type II diabetes mellitus ([place]) 04/20/2016  . Gout 04/20/2016  . B12 deficiency 04/20/2016  . 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 Hydronephrosis   . Thrombocytosis ([place]) 04/07/2016  . Pressure ulcer stage II 12/31/2015  . T wave inversion in EKG 12/29/2015  . PCP NOTES >>>>>>>>>>>>>>>>> 05/02/2015  . Peripheral edema   . Thoracic aortic aneurysm ([place]) 04/26/2014  . CKD (chronic kidney disease) stage 3, GFR 30-59 ml/min (HCC) 04/26/2014  .  Generalized weakness 04/06/2014  . Venous stasis dermatitis of both lower extremities   . Morbid obesity due to excess calories ([place]) 03/29/2014  . 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 Poor compliance with advise  05/05/2012  . Annual physical exam 01/14/2012  . Weight loss 01/14/2012  . BPH (benign prostatic hyperplasia) 01/14/2012  . Myeloproliferative disease ([place]) 09/07/2006  . Obstructive sleep apnea 09/07/2006  . 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 Coronary atherosclerosis 09/07/2006  . Chronic diastolic CHF (congestive heart failure) ([place]) 09/07/2006  . Allergic rhinitis 09/07/2006    Past Surgical History:  Procedure Laterality Date  . FLEXIBLE SIGMOIDOSCOPY N/A 06/08/2017   Procedure: FLEXIBLE SIGMOIDOSCOPY;  Surgeon: Carol Ada, MD;  Location: [facility];  Service: Endoscopy;  Laterality: N/A;  . IR FLUORO GUIDE CV LINE RIGHT  05/07/2017  . IR FLUORO RM 30-60 MIN  05/07/2017  . IR US GUIDE VASC ACCESS RIGHT  05/07/2017  . TOE SURGERY Right    "tried to straighten out big toe" (07/11/2013)        Home Medications    Prior to Admission medications   Medication Sig Start Date End Date Taking? Authorizing Provider  acetaminophen (TYLENOL) 325 MG tablet Take 2 tablets (650 mg total) by mouth every 6 (six) hours as needed for mild pain (or Fever >/= 101). 05/08/17  Yes Rai, Ripudeep K, MD  aspirin EC 81 MG tablet Take 81 mg by mouth daily.   Yes [provider]  b complex vitamins tablet Take 1 tablet by mouth daily.   Yes [provider]  ferrous gluconate (FERGON) 240 (27 FE) MG tablet Take 240 mg by mouth daily.   Yes [provider]  furosemide (LASIX) 40 MG tablet Take 80mg  (2tabs) in morning and 40mg  (1tab) in evening Patient taking differently: Take 40-80 mg by mouth See admin instructions. Take 80mg  (2tabs) in morning and 40mg  (1tab) in evening 09/03/17  Yes Tillery,  Satira Mccallum, PA-C  hydrALAZINE (APRESOLINE) 25 MG tablet Take 1 tablet (25 mg total) by mouth 3 (three) times daily. 09/03/17  Yes Shirley Friar, PA-C  tamsulosin (FLOMAX) 0.4 MG CAPS capsule Take 1 capsule (0.4 mg total) by mouth daily after supper. 06/13/17  Yes Bonnielee Haff, MD  benzonatate (TESSALON) 100 MG capsule Take 1 capsule (100 mg total) by mouth 2 (two) times daily as needed for cough. Patient not taking: Reported on 09/05/2017 07/22/17   Modena Jansky, MD  OXYGEN Inhale 2 L into the lungs continuous.    [provider]    Family History Family History  Problem Relation Age of Onset  . Schizophrenia Son   . Mental illness Daughter   . Colon cancer Neg Hx   . Prostate cancer Neg Hx   . Heart attack Neg Hx   . Diabetes Neg Hx     Social History Social History  Tobacco Use  . Smoking status: Former Smoker    Packs/day: 0.25    Years: 12.00    Pack years: 3.00    Types: Cigarettes    Last attempt to quit: 05/18/1966    Years since quitting: 51.3  . Smokeless tobacco: Never Used  . Tobacco comment: quit smoking 45 years ago  Substance Use Topics  . Alcohol use: No    Alcohol/week: 0.0 oz  . Drug use: No     Allergies   Patient has no known allergies.   Review of Systems Review of Systems  Gastrointestinal: Positive for nausea.  Genitourinary: Positive for flank pain.  Musculoskeletal: Positive for back pain.  All other systems reviewed and are negative.    Physical Exam Updated Vital Signs BP (!) 141/67   Pulse 70   Temp 97.7 F (36.5 C) (Oral)   Resp 16   Ht 6\' 3"  (1.905 m)   Wt 87.5 kg (193 lb)   SpO2 99%   BMI 24.12 kg/m   Physical Exam  Constitutional: He appears well-developed and well-nourished.  HENT:  Head: Normocephalic and atraumatic.  Eyes: Conjunctivae are normal.  Neck: Normal range of motion. Neck supple.  Cardiovascular: Normal rate and regular rhythm.  No murmur heard. Pulmonary/Chest: Effort normal  and breath sounds normal. No respiratory distress.  Abdominal: Soft. There is no tenderness.  Musculoskeletal: He exhibits no edema.  Neurological: He is alert.  Skin: Skin is warm and dry.  Psychiatric: He has a normal mood and affect.  Nursing note and vitals reviewed.    ED Treatments / Results  Labs (all labs ordered are listed, but only abnormal results are displayed) Labs Reviewed  URINALYSIS, ROUTINE W REFLEX MICROSCOPIC - Abnormal; Notable for the following components:      Result Value   APPearance HAZY (*)    Hgb urine dipstick SMALL (*)    Protein, ur 100 (*)    Leukocytes, UA LARGE (*)    Bacteria, UA RARE (*)    Squamous Epithelial / LPF 0-5 (*)    All other components within normal limits  CBC WITH DIFFERENTIAL/PLATELET - Abnormal; Notable for the following components:   WBC 12.7 (*)    RBC 3.57 (*)    Hemoglobin 9.1 (*)    HCT 31.0 (*)    MCH 25.5 (*)    MCHC 29.4 (*)    RDW 17.0 (*)    Monocytes Absolute 3.0 (*)    Basophils Absolute 0.4 (*)    All other components within normal limits  BASIC METABOLIC PANEL - Abnormal; Notable for the following components:   BUN 48 (*)    GFR calc non Af Amer 56 (*)    All other components within normal limits  URINE CULTURE    EKG None  Radiology Ct Renal Stone Study  Result Date: 09/05/2017 CLINICAL DATA:  77 year old male with right flank and bladder pain. 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 EXAM: CT ABDOMEN AND PELVIS WITHOUT CONTRAST TECHNIQUE: Multidetector CT imaging of the abdomen and pelvis was performed following the standard protocol without IV contrast. COMPARISON:  Recent prior CT abdomen/pelvis 08/18/2017; contrast-enhanced CT scan of the abdomen and pelvis 06/07/2017 FINDINGS: Lower chest: Mild dependent atelectasis bilaterally. Otherwise, no acute abnormality within the visualized lower chest. Hepatobiliary: Normal hepatic contour and morphology. No discrete hepatic  lesions. Normal appearance of the gallbladder. No intra or extrahepatic biliary ductal dilatation. Pancreas: Unremarkable. No pancreatic ductal dilatation or surrounding inflammatory changes. Spleen:  Normal in size without focal abnormality. Adrenals/Urinary Tract: Normal adrenal glands. Stable simple cyst arising from the lower pole of the right kidney. No evidence of hydronephrosis or nephrolithiasis. The ureters are unremarkable. The bladder is diffusely thick walled. A Foley catheter is in place. Multiple bladder diverticula. The largest measures 4.2 x 4.0 cm. Stomach/Bowel: No focal bowel wall thickening or evidence of obstruction. Colonic diverticulosis without evidence of active diverticulitis. Vascular/Lymphatic: Limited evaluation in the absence of intravenous contrast. Atherosclerotic calcifications present along the abdominal aorta. No aneurysm. No suspicious lymphadenopathy. Reproductive: Prostate is unremarkable. Other: No abdominal wall hernia or abnormality. No abdominopelvic ascites. Musculoskeletal: Diffusely abnormal osseous structures with heterogeneous sclerosis and innumerable small lucent lesions throughout all visualized bones. These findings are essentially unchanged over the past several studies. IMPRESSION: 1. No acute abnormality within the abdomen or pelvis. 2. Foley catheter well positioned in the bladder. The bladder is diffusely thick walled and contains multiple diverticula. Wall thickening could reflect chronic trabeculation, or acute or chronic cystitis. If the diverticula are incompletely emptying, this could provide a source for urinary stasis and bacterial overgrowth. 3. Colonic diverticular disease without CT evidence of active inflammation. 4. Diffusely abnormal osseous structures with heterogeneous sclerosis and innumerable small lucent lesions. 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 Imaging findings are unchanged across multiple prior studies.  Electronically Signed   By: Jacqulynn Cadet M.D.   On: 09/05/2017 09:17    Procedures Procedures (including critical care time)  Medications Ordered in ED Medications - No data to display   Initial Impression / Assessment and Plan / ED Course  I have reviewed the triage vital signs and the nursing notes.  Pertinent labs & imaging results that were available during my care of the patient were reviewed by me and considered in my medical decision making (see chart for details).     MDM   Pt has elevated wbc count.  Ua shows infection,  Question new vs chronic.  Culture ordered.  Pt able to tolerate fluids. Ct shows no evidence of stone,  No hydronephrosis  I asked pt if he feels he can go home with treatment.  He does not feel like he can manage antibiotics at home.     Final Clinical Impressions(s) / ED Diagnoses   Final diagnoses:  Urinary tract infection without hematuria, site unspecified  Confusion  Acute right-sided low back pain without sciatica    ED Discharge Orders    None       Sidney Ace 09/05/17 1308    Fatima Blank, MD 09/05/17 2028

## 2017-09-05 NOTE — ED Notes (Signed)
Lunch tray was ordered at 11:32am, and confirmed. Wasn't delivered.

## 2017-09-05 NOTE — ED Triage Notes (Signed)
Pt comes via [facility] for R flank pain and bladder pain, pt has recent foley change, cloudy appearing urine in foley. On 2L o2 all the time

## 2017-09-06 ENCOUNTER — Inpatient Hospital Stay (HOSPITAL_COMMUNITY): Payer: Medicare Other

## 2017-09-06 DIAGNOSIS — Z9119 Patient's noncompliance with other medical treatment and regimen: Secondary | ICD-10-CM

## 2017-09-06 LAB — GLUCOSE, CAPILLARY: Glucose-Capillary: 112 mg/dL — ABNORMAL HIGH (ref 65–99)

## 2017-09-06 LAB — CBC
HCT: 28.3 % — ABNORMAL LOW (ref 39.0–52.0)
HEMOGLOBIN: 8.6 g/dL — AB (ref 13.0–17.0)
MCH: 26.3 pg (ref 26.0–34.0)
MCHC: 30.4 g/dL (ref 30.0–36.0)
MCV: 86.5 fL (ref 78.0–100.0)
Platelets: 197 10*3/uL (ref 150–400)
RBC: 3.27 MIL/uL — AB (ref 4.22–5.81)
RDW: 17.3 % — ABNORMAL HIGH (ref 11.5–15.5)
WBC: 13 10*3/uL — ABNORMAL HIGH (ref 4.0–10.5)

## 2017-09-06 LAB — BASIC METABOLIC PANEL
ANION GAP: 8 (ref 5–15)
BUN: 42 mg/dL — ABNORMAL HIGH (ref 6–20)
CALCIUM: 9.3 mg/dL (ref 8.9–10.3)
CHLORIDE: 102 mmol/L (ref 101–111)
CO2: 27 mmol/L (ref 22–32)
CREATININE: 1.17 mg/dL (ref 0.61–1.24)
GFR calc non Af Amer: 59 mL/min — ABNORMAL LOW (ref >60)
GLUCOSE: 103 mg/dL — AB (ref 65–99)
Potassium: 4.3 mmol/L (ref 3.5–5.1)
Sodium: 137 mmol/L (ref 135–145)

## 2017-09-06 LAB — URINE CULTURE

## 2017-09-06 LAB — PATHOLOGIST SMEAR REVIEW

## 2017-09-06 MED ORDER — LIDOCAINE 5 % EX PTCH
2.0000 | MEDICATED_PATCH | CUTANEOUS | Status: DC
  Administered 2017-09-07 – 2017-09-12 (×6): 2 via TRANSDERMAL
  Filled 2017-09-06 (×7): qty 2

## 2017-09-06 NOTE — Consult Note (Signed)
   [facility] CM Inpatient Consult   09/06/2017  Juan [name] 1941-03-09 [id number]  Follow up on re-admission with high risk unplanned readmission scores in Medicare.  Met with the patient at the bedside and he states, "Ma'am I keep getting sick with my back and my side hurting with this infection.  I am on an antibiotic now."  Asked about his home health and he states, "no one ever came out there and knocked on my door.  I haven't seen anyone form an agency.I kept up to my bargain but no one came ma'am." He states his son is still in the home all the time but,"he can't take care of himself from his own illness." Asked about ongoing needs and he declines care management at this time. "I think I made it this long without anyone coming and I owe [place] and they will charge me more if I go back over there."  Will update inpatient care management staff on findings.   [name], RN BSN [facility] Liaison  [phone] business mobile phone Toll free office [phone]

## 2017-09-06 NOTE — Progress Notes (Addendum)
PROGRESS NOTE    Juan [name]  JME:[id number] DOB: March 31, 1941 DOA: 09/05/2017 PCP: Colon Branch, MD   Outpatient Specialists:    Brief Narrative:  Juan [name] is a 77 y.o. 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  Recently discharged on 4/9 after AMS due to ? Infection blood vs urine (culture did not grow anything) and was treated with daptomycin.  He comes back after refusing SNF and home health with flank pain as well as subjective fever and chills.   In the last 6 months, patient has had 6 admission.  At one point in March, patient was deemed not to be able to make the medical decision to refuse PICC.  Patient was discharged home with son (who has schizophrenia)  Does have some chronic shortness of breath and wears 2L.  Does not follow a low salt diet.  Says he can walk several blocks with his son.  At home he has a wheelchair.  In the ER, his WBC count was elevated with left shift.  U/A was had large LE, and WBCs too numerous to count.  ER PA spoke with pharmacy and plan is to start [facility].       Assessment & Plan:   Active Problems:   Poor compliance with advise    Leukocytosis   CKD (chronic kidney disease) stage 3, GFR 30-59 ml/min (HCC)   Chronic diastolic CHF (congestive heart failure), NYHA class 2 (HCC)   UTI (urinary tract infection)   complicated UTI (last culture that was positive grew pseudomonas) -patient reports fever and chills -culture multiple species -will treat with fortaz per pharmacy-- plan to continue for a few more days and monitor for improvement -has foley catheter due to issues with prostate and urinary diverticulum not allowing him to empty bladder--- urology does not plan to remove foley until patient more ambulatory/rehabed ([facility]) -has appointment in May with urology per patient -catheter changed 2-3 weeks ago per patient  Flank pain -? Bone -has know disease -dg lumbar spine and place  lidocaine patch  leukocytosis -mild elevation with left shift -abx -monitor for improvement  Chronic resp symptoms -on 2L home O2  Chronic diastolic CHF -some LE edema-- from skin appears to be improved from baseline -continue home lasix -daily BMP to monitor Cr  Myelodysplasia: On EPO. - Follow up with Dr. Marin Olp, heme/onc -spoke with Dr. Marin Olp, patient does not follow up on a regular basis  OSA:  - CPAP qHS  Poor compliance with advise and poor home situation -during an admission in March, patient was deemed not to be able to make decision regarding PICC Line but was d/c'd home -has been seen by palliative care numerous times in the past-- last 01/2017     DVT prophylaxis:  Lovenox   Code Status: Full Code   Family Communication: None at bedside  Disposition Plan:  Pending PT--- seems to have a lot of social issues with poor insight   Consultants:      Subjective: Only feeling a little bit better, spoke about not being able to afford food as "they won't give me disability"  Objective: Vitals:   09/05/17 2018 09/06/17 0030 09/06/17 0434 09/06/17 1020  BP: (!) 146/69  (!) 155/72 (!) 144/67  Pulse: 78  70 74  Resp:    16  Temp: 99.8 F (37.7 C)  99.1 F (37.3 C) 98 F (36.7 C)  TempSrc: Oral  Oral Oral  SpO2: 95%  99%  100%  Weight:  83.5 kg (184 lb 1.4 oz)    Height:        Intake/Output Summary (Last 24 hours) at 09/06/2017 1602 Last data filed at 09/06/2017 1000 Gross per 24 hour  Intake 800 ml  Output 2550 ml  Net -1750 ml   Filed Weights   09/05/17 0732 09/05/17 1657 09/06/17 0030  Weight: 87.5 kg (193 lb) 83.5 kg (184 lb 1.4 oz) 83.5 kg (184 lb 1.4 oz)    Examination:  General exam: in chair-- eating Respiratory system: diminished, no wheezing Cardiovascular system: rrr Gastrointestinal system: +Bs, soft Central nervous system: Alert- no focal deficits Extremities: LE edema swelling-- legs dangling in chair Skin: No  rashes, lesions or ulcers Psychiatry: poor eye contact    Data Reviewed: I have personally reviewed following labs and imaging studies  CBC: Recent Labs  Lab 09/05/17 0732 09/06/17 0457  WBC 12.7* 13.0*  NEUTROABS 7.7  --   HGB 9.1* 8.6*  HCT 31.0* 28.3*  MCV 86.8 86.5  PLT 209 443   Basic Metabolic Panel: Recent Labs  Lab 09/03/17 1008 09/05/17 0732 09/06/17 0457  NA 142 139 137  K 4.3 4.2 4.3  CL 104 102 102  CO2 27 25 27   GLUCOSE 102* 93 103*  BUN 54* 48* 42*  CREATININE 1.17 1.23 1.17  CALCIUM 10.0 9.6 9.3   GFR: Estimated Creatinine Clearance: 64.4 mL/min (by C-G formula based on SCr of 1.17 mg/dL). Liver Function Tests: No results for input(s): AST, ALT, ALKPHOS, BILITOT, PROT, ALBUMIN in the last 168 hours. No results for input(s): LIPASE, AMYLASE in the last 168 hours. No results for input(s): AMMONIA in the last 168 hours. Coagulation Profile: No results for input(s): INR, PROTIME in the last 168 hours. Cardiac Enzymes: No results for input(s): CKTOTAL, CKMB, CKMBINDEX, TROPONINI in the last 168 hours. BNP (last 3 results) No results for input(s): PROBNP in the last 8760 hours. HbA1C: No results for input(s): HGBA1C in the last 72 hours. CBG: Recent Labs  Lab 09/05/17 1658  GLUCAP 112*   Lipid Profile: No results for input(s): CHOL, HDL, LDLCALC, TRIG, CHOLHDL, LDLDIRECT in the last 72 hours. Thyroid Function Tests: No results for input(s): TSH, T4TOTAL, FREET4, T3FREE, THYROIDAB in the last 72 hours. Anemia Panel: No results for input(s): VITAMINB12, FOLATE, FERRITIN, TIBC, IRON, RETICCTPCT in the last 72 hours. Urine analysis:    Component Value Date/Time   COLORURINE YELLOW 09/05/2017 0108   APPEARANCEUR HAZY (A) 09/05/2017 0108   LABSPEC 1.010 09/05/2017 0108   PHURINE 7.0 09/05/2017 0108   GLUCOSEU NEGATIVE 09/05/2017 0108   GLUCOSEU NEGATIVE 05/02/2015 1008   HGBUR SMALL (A) 09/05/2017 0108   BILIRUBINUR NEGATIVE 09/05/2017 0108    KETONESUR NEGATIVE 09/05/2017 0108   PROTEINUR 100 (A) 09/05/2017 0108   UROBILINOGEN 0.2 05/02/2015 1008   NITRITE NEGATIVE 09/05/2017 0108   LEUKOCYTESUR LARGE (A) 09/05/2017 0108     ) Recent Results (from the past 240 hour(s))  Urine culture     Status: Abnormal   Collection Time: 09/05/17  7:32 AM  Result Value Ref Range Status   Specimen Description URINE, RANDOM  Final   Special Requests   Final    NONE Performed at [facility], [place] [address], [place], [place] [address]    Culture MULTIPLE SPECIES PRESENT, SUGGEST RECOLLECTION (A)  Final   Report Status 09/06/2017 FINAL  Final      Anti-infectives (From admission, onward)   Start  Dose/Rate Route Frequency Ordered Stop   09/05/17 2200  cefTAZidime (FORTAZ) 1 g in sodium chloride 0.9 % 100 mL IVPB     1 g 200 mL/hr over 30 Minutes Intravenous Every 8 hours 09/05/17 1412     09/05/17 1430  cefTAZidime (FORTAZ) 1 g in sodium chloride 0.9 % 100 mL IVPB     1 g 200 mL/hr over 30 Minutes Intravenous  Once 09/05/17 1357 09/05/17 1700       Radiology Studies: Dg Chest Port 1 View  Result Date: 09/05/2017 CLINICAL DATA:  Right flank and bladder pain today. History of myelofibrosis. EXAM: PORTABLE CHEST 1 VIEW COMPARISON:  PA and scratch the single view of the chest 08/18/2017. PA and lateral chest 07/16/2016 and 07/06/2017. FINDINGS: The lungs are clear. Heart size is enlarged. No pneumothorax or pleural effusion. Diffuse sclerosis in all imaged bones is unchanged. IMPRESSION: No acute disease. Diffuse sclerosis in all imaged bones is compatible with the patient's history myelofibrosis. Electronically Signed   By: Inge Rise M.D.   On: 09/05/2017 14:23   Ct Renal Stone Study  Result Date: 09/05/2017 CLINICAL DATA:  77 year old male with right flank and bladder pain. 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 EXAM: CT ABDOMEN AND PELVIS WITHOUT CONTRAST TECHNIQUE: Multidetector CT  imaging of the abdomen and pelvis was performed following the standard protocol without IV contrast. COMPARISON:  Recent prior CT abdomen/pelvis 08/18/2017; contrast-enhanced CT scan of the abdomen and pelvis 06/07/2017 FINDINGS: Lower chest: Mild dependent atelectasis bilaterally. Otherwise, no acute abnormality within the visualized lower chest. Hepatobiliary: Normal hepatic contour and morphology. No discrete hepatic lesions. Normal appearance of the gallbladder. No intra or extrahepatic biliary ductal dilatation. Pancreas: Unremarkable. No pancreatic ductal dilatation or surrounding inflammatory changes. Spleen: Normal in size without focal abnormality. Adrenals/Urinary Tract: Normal adrenal glands. Stable simple cyst arising from the lower pole of the right kidney. No evidence of hydronephrosis or nephrolithiasis. The ureters are unremarkable. The bladder is diffusely thick walled. A Foley catheter is in place. Multiple bladder diverticula. The largest measures 4.2 x 4.0 cm. Stomach/Bowel: No focal bowel wall thickening or evidence of obstruction. Colonic diverticulosis without evidence of active diverticulitis. Vascular/Lymphatic: Limited evaluation in the absence of intravenous contrast. Atherosclerotic calcifications present along the abdominal aorta. No aneurysm. No suspicious lymphadenopathy. Reproductive: Prostate is unremarkable. Other: No abdominal wall hernia or abnormality. No abdominopelvic ascites. Musculoskeletal: Diffusely abnormal osseous structures with heterogeneous sclerosis and innumerable small lucent lesions throughout all visualized bones. These findings are essentially unchanged over the past several studies. IMPRESSION: 1. No acute abnormality within the abdomen or pelvis. 2. Foley catheter well positioned in the bladder. The bladder is diffusely thick walled and contains multiple diverticula. Wall thickening could reflect chronic trabeculation, or acute or chronic cystitis. If the  diverticula are incompletely emptying, this could provide a source for urinary stasis and bacterial overgrowth. 3. Colonic diverticular disease without CT evidence of active inflammation. 4. Diffusely abnormal osseous structures with heterogeneous sclerosis and innumerable small lucent lesions. 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 Imaging findings are unchanged across multiple prior studies. Electronically Signed   By: Jacqulynn Cadet M.D.   On: 09/05/2017 09:17        Scheduled Meds: . aspirin EC  81 mg Oral Daily  . B-complex with vitamin C  1 tablet Oral Daily  . enoxaparin (LOVENOX) injection  40 mg Subcutaneous Q24H  . ferrous gluconate  324 mg Oral Daily  . furosemide  40 mg  Oral q1800  . furosemide  80 mg Oral QAC breakfast  . hydrALAZINE  25 mg Oral TID  . tamsulosin  0.4 mg Oral QPC supper   Continuous Infusions: . cefTAZidime (FORTAZ)  IV 1 g (09/06/17 1413)     LOS: 1 day    Time spent: 35 min    Geradine Girt, DO [facility] Pager [phone]  If 7PM-7AM, please contact night-coverage [email or web address] Password [facility] 09/06/2017, 4:02 PM

## 2017-09-07 LAB — BASIC METABOLIC PANEL
ANION GAP: 8 (ref 5–15)
BUN: 43 mg/dL — ABNORMAL HIGH (ref 6–20)
CHLORIDE: 102 mmol/L (ref 101–111)
CO2: 29 mmol/L (ref 22–32)
CREATININE: 1.27 mg/dL — AB (ref 0.61–1.24)
Calcium: 9.3 mg/dL (ref 8.9–10.3)
GFR calc non Af Amer: 54 mL/min — ABNORMAL LOW (ref >60)
Glucose, Bld: 107 mg/dL — ABNORMAL HIGH (ref 65–99)
POTASSIUM: 4.4 mmol/L (ref 3.5–5.1)
SODIUM: 139 mmol/L (ref 135–145)

## 2017-09-07 LAB — CBC
HCT: 28.2 % — ABNORMAL LOW (ref 39.0–52.0)
Hemoglobin: 8.5 g/dL — ABNORMAL LOW (ref 13.0–17.0)
MCH: 26 pg (ref 26.0–34.0)
MCHC: 30.1 g/dL (ref 30.0–36.0)
MCV: 86.2 fL (ref 78.0–100.0)
Platelets: 175 10*3/uL (ref 150–400)
RBC: 3.27 MIL/uL — AB (ref 4.22–5.81)
RDW: 16.9 % — ABNORMAL HIGH (ref 11.5–15.5)
WBC: 13.9 10*3/uL — AB (ref 4.0–10.5)

## 2017-09-07 NOTE — Progress Notes (Signed)
PROGRESS NOTE    Juan [name]  PJA:[id number] DOB: 03/20/1941 DOA: 09/05/2017 PCP: Colon Branch, MD   Outpatient Specialists:    Brief Narrative:  Juan [name] is a 77 y.o. 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  Recently discharged on 4/9 after AMS due to ? Infection blood vs urine (culture did not grow anything) and was treated with daptomycin.  He comes back after refusing SNF and home health with flank pain as well as subjective fever and chills.   In the last 6 months, patient has had 6 admission.  At one point in March, patient was deemed not to be able to make the medical decision to refuse PICC.  Patient was discharged home with son (who has schizophrenia)  Does have some chronic shortness of breath and wears 2L.  Does not follow a low salt diet.  Says he can walk several blocks with his son.  At home he has a wheelchair.  In the ER, his WBC count was elevated with left shift.  U/A was had large LE, and WBCs too numerous to count.  ER PA spoke with pharmacy and plan is to start [facility].       Assessment & Plan:   Active Problems:   Poor compliance with advise    Leukocytosis   CKD (chronic kidney disease) stage 3, GFR 30-59 ml/min (HCC)   Chronic diastolic CHF (congestive heart failure), NYHA class 2 (HCC)   UTI (urinary tract infection)   complicated UTI (last culture that was positive grew pseudomonas) -patient reports fever and chills -culture multiple species -will treat with fortaz per pharmacy-- plan to continue for a few more days and monitor for improvement -has foley catheter due to issues with prostate and urinary diverticulum not allowing him to empty bladder--- urology does not plan to remove foley until patient more ambulatory/rehabed ([facility]) -has appointment in May with urology per patient -catheter changed 2-3 weeks ago per patient -PT eval  Flank pain -? Bone -has know disease -dg lumbar spine did  not show fracture -patient now agreeable to patches  leukocytosis -mild elevation with left shift -abx -monitor for improvement -? Transition to leukemia  Chronic resp symptoms -on 2L home O2  Chronic diastolic CHF -some LE edema-- from skin appears to be improved from baseline -continue home lasix -daily BMP to monitor Cr  Myelodysplasia: On EPO. - Follow up with Dr. Marin Olp, heme/onc -spoke with Dr. Marin Olp, patient does not follow up on a regular basis  OSA:  - CPAP qHS  Poor compliance with advise and poor home situation -during an admission in March, patient was deemed not to be able to make decision regarding PICC Line but was d/c'd home -has been seen by palliative care numerous times in the past-- last 01/2017     DVT prophylaxis:  Lovenox   Code Status: Full Code   Family Communication: None at bedside  Disposition Plan:  Pending PT--- seems to have a lot of social issues with poor insight   Consultants:      Subjective: Severe back pain last pm while getting x rays  Objective: Vitals:   09/06/17 1643 09/06/17 2038 09/07/17 0420 09/07/17 0903  BP:  (!) 151/65 (!) 148/66 (!) 154/78  Pulse: 79 76 80 78  Resp: 17   18  Temp: 98.5 F (36.9 C) 99.8 F (37.7 C) 99 F (37.2 C) 97.7 F (36.5 C)  TempSrc: Oral Oral Oral Oral  SpO2:  100% 97% 98% 100%  Weight:      Height:        Intake/Output Summary (Last 24 hours) at 09/07/2017 1445 Last data filed at 09/07/2017 1015 Gross per 24 hour  Intake 480 ml  Output 1900 ml  Net -1420 ml   Filed Weights   09/05/17 0732 09/05/17 1657 09/06/17 0030  Weight: 87.5 kg (193 lb) 83.5 kg (184 lb 1.4 oz) 83.5 kg (184 lb 1.4 oz)    Examination:  General exam: in chair, watching TV Respiratory system: no increased work of breathing, look comfortable on 2L Cardiovascular system: rrr Gastrointestinal system: +Bs, soft Central nervous system: alert Extremities: LE edema-- especially feet----  refused to elevate Skin: no rashes     Data Reviewed: I have personally reviewed following labs and imaging studies  CBC: Recent Labs  Lab 09/05/17 0732 09/06/17 0457 09/07/17 0328  WBC 12.7* 13.0* 13.9*  NEUTROABS 7.7  --   --   HGB 9.1* 8.6* 8.5*  HCT 31.0* 28.3* 28.2*  MCV 86.8 86.5 86.2  PLT 209 197 509   Basic Metabolic Panel: Recent Labs  Lab 09/03/17 1008 09/05/17 0732 09/06/17 0457 09/07/17 0328  NA 142 139 137 139  K 4.3 4.2 4.3 4.4  CL 104 102 102 102  CO2 27 25 27 29   GLUCOSE 102* 93 103* 107*  BUN 54* 48* 42* 43*  CREATININE 1.17 1.23 1.17 1.27*  CALCIUM 10.0 9.6 9.3 9.3   GFR: Estimated Creatinine Clearance: 59.4 mL/min (A) (by C-G formula based on SCr of 1.27 mg/dL (H)). Liver Function Tests: No results for input(s): AST, ALT, ALKPHOS, BILITOT, PROT, ALBUMIN in the last 168 hours. No results for input(s): LIPASE, AMYLASE in the last 168 hours. No results for input(s): AMMONIA in the last 168 hours. Coagulation Profile: No results for input(s): INR, PROTIME in the last 168 hours. Cardiac Enzymes: No results for input(s): CKTOTAL, CKMB, CKMBINDEX, TROPONINI in the last 168 hours. BNP (last 3 results) No results for input(s): PROBNP in the last 8760 hours. HbA1C: No results for input(s): HGBA1C in the last 72 hours. CBG: Recent Labs  Lab 09/05/17 1658 09/06/17 2038  GLUCAP 112* 112*   Lipid Profile: No results for input(s): CHOL, HDL, LDLCALC, TRIG, CHOLHDL, LDLDIRECT in the last 72 hours. Thyroid Function Tests: No results for input(s): TSH, T4TOTAL, FREET4, T3FREE, THYROIDAB in the last 72 hours. Anemia Panel: No results for input(s): VITAMINB12, FOLATE, FERRITIN, TIBC, IRON, RETICCTPCT in the last 72 hours. Urine analysis:    Component Value Date/Time   COLORURINE YELLOW 09/05/2017 0108   APPEARANCEUR HAZY (A) 09/05/2017 0108   LABSPEC 1.010 09/05/2017 0108   PHURINE 7.0 09/05/2017 0108   GLUCOSEU NEGATIVE 09/05/2017 0108    GLUCOSEU NEGATIVE 05/02/2015 1008   HGBUR SMALL (A) 09/05/2017 0108   BILIRUBINUR NEGATIVE 09/05/2017 0108   KETONESUR NEGATIVE 09/05/2017 0108   PROTEINUR 100 (A) 09/05/2017 0108   UROBILINOGEN 0.2 05/02/2015 1008   NITRITE NEGATIVE 09/05/2017 0108   LEUKOCYTESUR LARGE (A) 09/05/2017 0108     ) Recent Results (from the past 240 hour(s))  Urine culture     Status: Abnormal   Collection Time: 09/05/17  7:32 AM  Result Value Ref Range Status   Specimen Description URINE, RANDOM  Final   Special Requests   Final    NONE Performed at [facility], [place] [address] [place], [place] [address]    Culture MULTIPLE SPECIES PRESENT, SUGGEST RECOLLECTION (A)  Final   Report  Status 09/06/2017 FINAL  Final      Anti-infectives (From admission, onward)   Start     Dose/Rate Route Frequency Ordered Stop   09/05/17 2200  cefTAZidime (FORTAZ) 1 g in sodium chloride 0.9 % 100 mL IVPB     1 g 200 mL/hr over 30 Minutes Intravenous Every 8 hours 09/05/17 1412     09/05/17 1430  cefTAZidime (FORTAZ) 1 g in sodium chloride 0.9 % 100 mL IVPB     1 g 200 mL/hr over 30 Minutes Intravenous  Once 09/05/17 1357 09/05/17 1700       Radiology Studies: Dg Lumbar Spine 2-3 Views  Result Date: 09/06/2017 CLINICAL DATA:  Localized low back pain for 2 days. EXAM: LUMBAR SPINE - 2-3 VIEW COMPARISON:  Body CT 09/05/2017 FINDINGS: Sclerotic appearance of the axial skeleton. Straightening of the lumbosacral lordosis. No evidence of compression deformity to suggest acute fracture. IMPRESSION: Grossly abnormal sclerotic appearance of the visualized axial skeleton, without evidence of acute fracture. Electronically Signed   By: Fidela Salisbury M.D.   On: 09/06/2017 21:41        Scheduled Meds: . aspirin EC  81 mg Oral Daily  . B-complex with vitamin C  1 tablet Oral Daily  . enoxaparin (LOVENOX) injection  40 mg Subcutaneous Q24H  . ferrous gluconate  324 mg Oral Daily  . furosemide  40 mg Oral  q1800  . furosemide  80 mg Oral QAC breakfast  . hydrALAZINE  25 mg Oral TID  . lidocaine  2 patch Transdermal Q24H  . tamsulosin  0.4 mg Oral QPC supper   Continuous Infusions: . cefTAZidime (FORTAZ)  IV 1 g (09/07/17 1410)     LOS: 2 days    Time spent: 35 min    Geradine Girt, DO [facility] Pager [phone]  If 7PM-7AM, please contact night-coverage [email or web address] Password [facility] 09/07/2017, 2:45 PM

## 2017-09-07 NOTE — Progress Notes (Signed)
PT Cancellation Note  Patient Details Name: Juan [name] MRN: [id number] DOB: Nov 13, 1940   Cancelled Treatment:    Reason Eval/Treat Not Completed: Patient declined, no reason specified   Adamantly refusing PT despite best efforts at encouragement and education;  Asks that PT returns tomorrow "after lunch" for PT eval;   Roney Marion, [place] Pager [phone] Office [phone]    Colletta [place] 09/07/2017, 3:20 PM

## 2017-09-08 DIAGNOSIS — I5032 Chronic diastolic (congestive) heart failure: Secondary | ICD-10-CM

## 2017-09-08 LAB — BASIC METABOLIC PANEL
ANION GAP: 8 (ref 5–15)
BUN: 45 mg/dL — ABNORMAL HIGH (ref 6–20)
CALCIUM: 9.5 mg/dL (ref 8.9–10.3)
CO2: 28 mmol/L (ref 22–32)
CREATININE: 1.22 mg/dL (ref 0.61–1.24)
Chloride: 106 mmol/L (ref 101–111)
GFR, EST NON AFRICAN AMERICAN: 56 mL/min — AB (ref >60)
Glucose, Bld: 100 mg/dL — ABNORMAL HIGH (ref 65–99)
Potassium: 4.7 mmol/L (ref 3.5–5.1)
Sodium: 142 mmol/L (ref 135–145)

## 2017-09-08 LAB — CBC
HCT: 30 % — ABNORMAL LOW (ref 39.0–52.0)
HEMOGLOBIN: 9.5 g/dL — AB (ref 13.0–17.0)
MCH: 26.4 pg (ref 26.0–34.0)
MCHC: 31.7 g/dL (ref 30.0–36.0)
MCV: 83.3 fL (ref 78.0–100.0)
PLATELETS: 136 10*3/uL — AB (ref 150–400)
RBC: 3.6 MIL/uL — AB (ref 4.22–5.81)
RDW: 17.1 % — ABNORMAL HIGH (ref 11.5–15.5)
WBC: 16 10*3/uL — AB (ref 4.0–10.5)

## 2017-09-08 MED ORDER — SALINE SPRAY 0.65 % NA SOLN
1.0000 | NASAL | Status: DC | PRN
  Filled 2017-09-08 (×2): qty 44

## 2017-09-08 NOTE — Care Management Note (Addendum)
Case Management Note  Patient Details  Name: Juan [name] MRN: [id number] Date of Birth: November 30, 1940  Subjective/Objective:   History significant ofchronic foley due to BPH and bladder diverticulum. Recently discharged on 4/9 for Acute Encephalotomy  and was treated with daptomycin, and refused SNF and home health so discharged home with son who has schizophrenia;  Admitted for Urinary Tract Infection.               Action/Plan: NCM called Urologist office to verify that patient does have upcoming appointment with [name].  Patient has history of non-compliance. Last admission on 08/24/2017 patient was set up with [place]: PT/OT/RN/nurse aide with [employer] and was encouraged to let therapy at least evaluate him. [name] with [facility] was going to follow up with him to see if he will accept the services. Patient is known for setting specific parameters for care; NCM on previous admission advised patient [place] may not be able to accommodate his specific requests due to other patients.  Patient declined Physical Therapy 09/07/2017.  Home DME: Walker - 2 wheels;Cane - single point;Bedside commode;Wheelchair - manual;Hospital bed, oxygen  [name], RN BSN [place] [facility] Liaison spoke with patient on 09/06/17, per patient "I am on an antibiotic now."  Asked about his home health and he states, "no one ever came out there and knocked on my door.  I haven't seen anyone form an agency.I kept up to my bargain but no one came ma'am." He states his son is still in the home all the time but,"he can't take care of himself from his own illness." Asked about ongoing needs and he declines care management at this time. "I think I made it this long without anyone coming and I owe [employer] and they will charge me more if I go back over there."  Spoke with [facility] hospital liaison for [place] home health regarding home first program; unfortunately patient was previously set up for around the  clock care and he would not let anyone in his home.  [name] states patient was not receptive at all in the past to regular home health services either.    Expected Discharge Date:     To be determined          Expected Discharge Plan:   home with self care  In-House Referral:   [facility]  Discharge planning Services   CM consult  Post Acute Care Choice:    Choice offered to:     DME Arranged:    DME Agency:     HH Arranged:    HH Agency:     Status of Service:   In progress, will continue to monitor.  If discussed at [place] of Stay Meetings, dates discussed:    Additional Comments:    [name], RN 09/08/2017, 8:58 AM

## 2017-09-08 NOTE — Progress Notes (Signed)
PROGRESS NOTE    Juan [name]  HYW:[id number] DOB: 1940/10/18 DOA: 09/05/2017 PCP: Colon Branch, MD   Outpatient Specialists:    Brief Narrative:  Juan [name] is a 77 y.o. 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  Recently discharged on 4/9 after AMS due to ? Infection blood vs urine (culture did not grow anything) and was treated with daptomycin.  He comes back after refusing SNF and home health with flank pain as well as subjective fever and chills.   In the last 6 months, patient has had 6 admission.  At one point in March, patient was deemed not to be able to make the medical decision to refuse PICC.  Patient was discharged home with son (who has schizophrenia)  Does have some chronic shortness of breath and wears 2L.  Does not follow a low salt diet.  Says he can walk several blocks with his son.  At home he has a wheelchair.  In the ER, his WBC count was elevated with left shift.  U/A was had large LE, and WBCs too numerous to count.  ER PA spoke with pharmacy and plan is to start [facility].       Assessment & Plan:   Active Problems:   Poor compliance with advise    Leukocytosis   CKD (chronic kidney disease) stage 3, GFR 30-59 ml/min (HCC)   Chronic diastolic CHF (congestive heart failure), NYHA class 2 (HCC)   UTI (urinary tract infection)   Flank pain thought due to complicated UTI (last culture that was positive grew pseudomonas) but more likely musculoskeletal -patient reported fever and chills but none since hospitalization -urine culture multiple species -s/p fortaz for 3 days-- stopped as not convinced he has an infection -has foley catheter due to issues with prostate and urinary diverticulum not allowing him to empty bladder--- urology does not plan to remove foley until patient more ambulatory/rehabed ([place]) -has appointment in May with urology per patient -catheter changed 2-3 weeks ago per patient -PT  eval -pain control with lidocaine patches seem to be helping  leukocytosis -mild elevation with left shift-- trending up despite abx -has known myelodysplasia-- ? Transformation-- may need oncology consult  Chronic resp symptoms -on 2L home O2  Chronic diastolic CHF -some LE edema as pateint refuses to elevate legs-- from skin appears to be improved from baseline -continue home lasix (down 5L so questions if patient even taking meds at home) -daily BMP to monitor Cr  Myelodysplasia:  - Follows with Dr. Marin Olp, heme/onc -spoke with Dr. Marin Olp, patient does not follow up on a regular basis -plts trending down-- stop lovenox  OSA:  - CPAP qHS  Poor compliance with advise and poor home situation -during an admission in March, patient was deemed not to be able to make decision regarding PICC Line but was d/c'd home with son who has schizophrenia -has been seen by palliative care numerous times in the past-- last 01/2017 -? If qualifies for [facility] home care -- Patient seems to lack understanding but I think it is from limited education not dementia?     DVT prophylaxis:  scd (stopped lovenox with plts trending down)   Code Status: Full Code   Family Communication: son at bedside  Disposition Plan:  Pending PT--- seems to have a lot of social issues with poor insight/education   Consultants:      Subjective: Pain improved with patches-- says nursing to place patches again today-- plans  to work with PT after lunch  Objective: Vitals:   09/07/17 1539 09/07/17 2142 09/08/17 0421 09/08/17 1001  BP: (!) 144/67 (!) 151/83 (!) 145/64 (!) 150/78  Pulse: 91 76 69 74  Resp: 18 18  16   Temp: 98.4 F (36.9 C) 99 F (37.2 C) 99 F (37.2 C) 99.3 F (37.4 C)  TempSrc: Oral Oral Oral Oral  SpO2: 98% 98% 100% 100%  Weight:  85.8 kg (189 lb 2.5 oz)    Height:        Intake/Output Summary (Last 24 hours) at 09/08/2017 1431 Last data filed at 09/08/2017 1357 Gross  per 24 hour  Intake 480 ml  Output 2702 ml  Net -2222 ml   Filed Weights   09/05/17 1657 09/06/17 0030 09/07/17 2142  Weight: 83.5 kg (184 lb 1.4 oz) 83.5 kg (184 lb 1.4 oz) 85.8 kg (189 lb 2.5 oz)    Examination:  General exam: in chair, leg dangling with yellow socks on Respiratory system: on 2LNC, no wheezing, no increased work of breathing Cardiovascular system: rrr Gastrointestinal system: +BS, soft Central nervous system: interactive, alert, cooperative Extremities: b/l LE edema with chronic skin changes      Data Reviewed: I have personally reviewed following labs and imaging studies  CBC: Recent Labs  Lab 09/05/17 0732 09/06/17 0457 09/07/17 0328 09/08/17 0501  WBC 12.7* 13.0* 13.9* 16.0*  NEUTROABS 7.7  --   --   --   HGB 9.1* 8.6* 8.5* 9.5*  HCT 31.0* 28.3* 28.2* 30.0*  MCV 86.8 86.5 86.2 83.3  PLT 209 197 175 638*   Basic Metabolic Panel: Recent Labs  Lab 09/03/17 1008 09/05/17 0732 09/06/17 0457 09/07/17 0328 09/08/17 0501  NA 142 139 137 139 142  K 4.3 4.2 4.3 4.4 4.7  CL 104 102 102 102 106  CO2 27 25 27 29 28   GLUCOSE 102* 93 103* 107* 100*  BUN 54* 48* 42* 43* 45*  CREATININE 1.17 1.23 1.17 1.27* 1.22  CALCIUM 10.0 9.6 9.3 9.3 9.5   GFR: Estimated Creatinine Clearance: 62.5 mL/min (by C-G formula based on SCr of 1.22 mg/dL). Liver Function Tests: No results for input(s): AST, ALT, ALKPHOS, BILITOT, PROT, ALBUMIN in the last 168 hours. No results for input(s): LIPASE, AMYLASE in the last 168 hours. No results for input(s): AMMONIA in the last 168 hours. Coagulation Profile: No results for input(s): INR, PROTIME in the last 168 hours. Cardiac Enzymes: No results for input(s): CKTOTAL, CKMB, CKMBINDEX, TROPONINI in the last 168 hours. BNP (last 3 results) No results for input(s): PROBNP in the last 8760 hours. HbA1C: No results for input(s): HGBA1C in the last 72 hours. CBG: Recent Labs  Lab 09/05/17 1658 09/06/17 2038  GLUCAP  112* 112*   Lipid Profile: No results for input(s): CHOL, HDL, LDLCALC, TRIG, CHOLHDL, LDLDIRECT in the last 72 hours. Thyroid Function Tests: No results for input(s): TSH, T4TOTAL, FREET4, T3FREE, THYROIDAB in the last 72 hours. Anemia Panel: No results for input(s): VITAMINB12, FOLATE, FERRITIN, TIBC, IRON, RETICCTPCT in the last 72 hours. Urine analysis:    Component Value Date/Time   COLORURINE YELLOW 09/05/2017 0108   APPEARANCEUR HAZY (A) 09/05/2017 0108   LABSPEC 1.010 09/05/2017 0108   PHURINE 7.0 09/05/2017 0108   GLUCOSEU NEGATIVE 09/05/2017 0108   GLUCOSEU NEGATIVE 05/02/2015 1008   HGBUR SMALL (A) 09/05/2017 0108   BILIRUBINUR NEGATIVE 09/05/2017 0108   KETONESUR NEGATIVE 09/05/2017 0108   PROTEINUR 100 (A) 09/05/2017 0108   UROBILINOGEN 0.2 05/02/2015  [facility] 09/05/2017 0108   LEUKOCYTESUR LARGE (A) 09/05/2017 0108     ) Recent Results (from the past 240 hour(s))  Urine culture     Status: Abnormal   Collection Time: 09/05/17  7:32 AM  Result Value Ref Range Status   Specimen Description URINE, RANDOM  Final   Special Requests   Final    NONE Performed at [facility], [place] [address] [place], [place] [address]    Culture MULTIPLE SPECIES PRESENT, SUGGEST RECOLLECTION (A)  Final   Report Status 09/06/2017 FINAL  Final      Anti-infectives (From admission, onward)   Start     Dose/Rate Route Frequency Ordered Stop   09/05/17 2200  cefTAZidime (FORTAZ) 1 g in sodium chloride 0.9 % 100 mL IVPB  Status:  Discontinued     1 g 200 mL/hr over 30 Minutes Intravenous Every 8 hours 09/05/17 1412 09/08/17 1051   09/05/17 1430  cefTAZidime (FORTAZ) 1 g in sodium chloride 0.9 % 100 mL IVPB     1 g 200 mL/hr over 30 Minutes Intravenous  Once 09/05/17 1357 09/05/17 1700       Radiology Studies: Dg Lumbar Spine 2-3 Views  Result Date: 09/06/2017 CLINICAL DATA:  Localized low back pain for 2 days. EXAM: LUMBAR SPINE - 2-3 VIEW COMPARISON:   Body CT 09/05/2017 FINDINGS: Sclerotic appearance of the axial skeleton. Straightening of the lumbosacral lordosis. No evidence of compression deformity to suggest acute fracture. IMPRESSION: Grossly abnormal sclerotic appearance of the visualized axial skeleton, without evidence of acute fracture. Electronically Signed   By: Fidela Salisbury M.D.   On: 09/06/2017 21:41        Scheduled Meds: . aspirin EC  81 mg Oral Daily  . B-complex with vitamin C  1 tablet Oral Daily  . enoxaparin (LOVENOX) injection  40 mg Subcutaneous Q24H  . ferrous gluconate  324 mg Oral Daily  . furosemide  40 mg Oral q1800  . furosemide  80 mg Oral QAC breakfast  . hydrALAZINE  25 mg Oral TID  . lidocaine  2 patch Transdermal Q24H  . tamsulosin  0.4 mg Oral QPC supper   Continuous Infusions:    LOS: 3 days    Time spent: 35 min    Geradine Girt, DO [facility] Pager [phone]  If 7PM-7AM, please contact night-coverage [email or web address] Password [facility] 09/08/2017, 2:31 PM

## 2017-09-08 NOTE — Evaluation (Signed)
Physical Therapy Evaluation Patient Details Name: Juan [name] MRN: [id number] DOB: [birth year] Today's Date: 09/08/2017   History of Present Illness  Juan [name] is a 77 y.o. 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  Recently discharged on 4/9 after AMS due to ? Infection blood vs urine (culture did not grow anything) and was treated with daptomycin.  He comes back after refusing SNF and home health with flank pain as well as subjective fever and chills.   In the last 6 months, patient has had 6 admission.  At one point in March, patient was deemed not to be able to make the medical decision to refuse PICC.  Patient was discharged home with son (who has schizophrenia)  Clinical Impression  Pt admitted with above diagnosis. Pt currently with functional limitations due to the deficits listed below (see PT Problem List). Presents with generalized weakness and decr activity tolerance; Recommend continued rehab at SNF, and talking with me, he seems amenable to SNF -- but noted in Case Mgr's note he will likely decline (states he owes [employer] money); Noted he is a [facility] ACO client -- is the [place] program an option for Juan [name]? Pt will benefit from skilled PT to increase their independence and safety with mobility to allow discharge to the venue listed below.       Follow Up Recommendations SNF(I favor continuing rehab to maximize independence and safety with mobility; Please see Cas[facility] Mgr's note -- there seems to be lots of barriers to PT/OT follow up for SNF or [place]; Noted he is a [facility] client; is the [facility] program an option for Juan [name]?)    Equipment Recommendations  None recommended by PT    Recommendations for Other Services       Precautions / Restrictions Precautions Precautions: Fall      Mobility  Bed Mobility Overal bed mobility: Needs Assistance Bed Mobility: Sit to Supine       Sit to supine: Min assist    General bed mobility comments: Min assist to help LEs into bed  Transfers Overall transfer level: Needs assistance Equipment used: Rolling walker (2 wheeled) Transfers: Sit to/from Stand Sit to Stand: Mod assist         General transfer comment: Mod assist to power up from lower recliner height; cues for hand placement  Ambulation/Gait Ambulation/Gait assistance: Min guard Ambulation Distance (Feet): (Hallway ambulation) Assistive device: Rolling walker (2 wheeled) Gait Pattern/deviations: Step-through pattern;Decreased stride length;Trunk flexed     General Gait Details: Adjusted RW for more optimal fit  Stairs            Wheelchair Mobility    Modified Rankin (Stroke Patients Only)       Balance Overall balance assessment: Needs assistance Sitting-balance support: Feet supported;No upper extremity supported Sitting balance-Leahy Scale: Good     Standing balance support: During functional activity;Bilateral upper extremity supported Standing balance-Leahy Scale: Poor                               Pertinent Vitals/Pain Pain Assessment: Faces Faces Pain Scale: Hurts a little bit Pain Location: flank pain Pain Descriptors / Indicators: Sore Pain Intervention(s): Monitored during session    Home Living Family/patient expects to be discharged to:: Private residence Living Arrangements: Children Available Help at Discharge: Family Type of Home: Apartment Home Access: Level entry  Home Layout: One level Home Equipment: Walker - 2 wheels;Cane - single point;Bedside commode;Wheelchair - manual;Hospital bed Additional Comments: As of 4/4: Pt reports he does not go into bathroom; sponge bathes and going to the bathroom at EOB    Prior Function Level of Independence: Needs assistance   Gait / Transfers Assistance Needed: RW for mobility  ADL's / Homemaking Assistance Needed: Sponge bathes at EOB, uses BSC only        Hand Dominance    Dominant Hand: Right    Extremity/Trunk Assessment   Upper Extremity Assessment Upper Extremity Assessment: Generalized weakness    Lower Extremity Assessment Lower Extremity Assessment: Generalized weakness       Communication   Communication: No difficulties  Cognition Arousal/Alertness: Awake/alert Behavior During Therapy: WFL for tasks assessed/performed Overall Cognitive Status: Impaired/Different from baseline(though [facility] for simple mobility tasks)                           Safety/Judgement: Decreased awareness of deficits            General Comments General comments (skin integrity, edema, etc.): Walked in hallway on 2 L supplemental O2    Exercises     Assessment/Plan    PT Assessment Patient needs continued PT services  PT Problem List Decreased strength;Decreased range of motion;Decreased activity tolerance;Decreased balance;Decreased coordination;Decreased mobility;Decreased cognition;Decreased knowledge of use of DME;Decreased safety awareness;Decreased knowledge of precautions       PT Treatment Interventions DME instruction;Gait training;Functional mobility training;Therapeutic activities;Therapeutic exercise;Balance training;Neuromuscular re-education;Cognitive remediation;Patient/family education;Wheelchair mobility training    PT Goals (Current goals can be found in the Care Plan section)  Acute Rehab PT Goals Patient Stated Goal: did not state PT Goal Formulation: With patient Time For Goal Achievement: 09/22/17 Potential to Achieve Goals: Good    Frequency Min 3X/week   Barriers to discharge Decreased caregiver support Noted son's assistance/reliability is questionable; Juan [name] seems amenable to SNF fo rrehab    Co-evaluation               AM-PAC PT "6 Clicks" Daily Activity  Outcome Measure Difficulty turning over in bed (including adjusting bedclothes, sheets and blankets)?: A Little Difficulty moving from lying on back  to sitting on the side of the bed? : A Little Difficulty sitting down on and standing up from a chair with arms (e.g., wheelchair, bedside commode, etc,.)?: Unable Help needed moving to and from a bed to chair (including a wheelchair)?: A Little Help needed walking in hospital room?: A Little Help needed climbing 3-5 steps with a railing? : A Lot 6 Click Score: 15    End of Session Equipment Utilized During Treatment: Oxygen Activity Tolerance: Patient tolerated treatment well Patient left: in bed;with call [place]/phone within reach;with bed alarm set Nurse Communication: Mobility status(and emptied 720 cc of urine from foley bag) PT Visit Diagnosis: Other abnormalities of gait and mobility (R26.89)    Time: 1411-1440 PT Time Calculation (min) (ACUTE ONLY): 29 min   Charges:   PT Evaluation $PT Eval Low Complexity: 1 Low PT Treatments $Gait Training: 8-22 mins   PT G Codes:        Roney Marion, PT  Acute Rehabilitation Services Pager [phone] Office [phone]   [place] 09/08/2017, 3:01 PM

## 2017-09-09 LAB — CBC WITH DIFFERENTIAL/PLATELET
BAND NEUTROPHILS: 0 %
BASOS PCT: 1 %
Basophils Absolute: 0.2 10*3/uL — ABNORMAL HIGH (ref 0.0–0.1)
Blasts: 0 %
EOS ABS: 0 10*3/uL (ref 0.0–0.7)
Eosinophils Relative: 0 %
HCT: 28.3 % — ABNORMAL LOW (ref 39.0–52.0)
Hemoglobin: 8.4 g/dL — ABNORMAL LOW (ref 13.0–17.0)
LYMPHS PCT: 10 %
Lymphs Abs: 1.6 10*3/uL (ref 0.7–4.0)
MCH: 25.6 pg — ABNORMAL LOW (ref 26.0–34.0)
MCHC: 29.7 g/dL — AB (ref 30.0–36.0)
MCV: 86.3 fL (ref 78.0–100.0)
MONO ABS: 1.9 10*3/uL — AB (ref 0.1–1.0)
MONOS PCT: 12 %
Metamyelocytes Relative: 0 %
Myelocytes: 0 %
NEUTROS PCT: 77 %
NRBC: 0 /100{WBCs}
Neutro Abs: 12.1 10*3/uL — ABNORMAL HIGH (ref 1.7–7.7)
OTHER: 0 %
PLATELETS: 203 10*3/uL (ref 150–400)
PROMYELOCYTES RELATIVE: 0 %
RBC: 3.28 MIL/uL — ABNORMAL LOW (ref 4.22–5.81)
RDW: 16.9 % — ABNORMAL HIGH (ref 11.5–15.5)
WBC: 15.8 10*3/uL — ABNORMAL HIGH (ref 4.0–10.5)

## 2017-09-09 LAB — BASIC METABOLIC PANEL
Anion gap: 8 (ref 5–15)
BUN: 46 mg/dL — AB (ref 6–20)
CHLORIDE: 105 mmol/L (ref 101–111)
CO2: 27 mmol/L (ref 22–32)
CREATININE: 1.22 mg/dL (ref 0.61–1.24)
Calcium: 9.6 mg/dL (ref 8.9–10.3)
GFR calc Af Amer: >60 mL/min (ref >60)
GFR, EST NON AFRICAN AMERICAN: 56 mL/min — AB (ref >60)
GLUCOSE: 99 mg/dL (ref 65–99)
POTASSIUM: 4.6 mmol/L (ref 3.5–5.1)
SODIUM: 140 mmol/L (ref 135–145)

## 2017-09-09 NOTE — Progress Notes (Signed)
Patient Demographics:    Juan [name], is a 77 y.o. male, DOB - 1941/02/10, HAL:[id number]  Admit date - 09/05/2017   Admitting Physician Geradine Girt, DO  Outpatient Primary MD for the patient is Colon Branch, MD  LOS - 4   Chief Complaint  Patient presents with  . Recurrent UTI        Subjective:    Juan [name] has no fevers, no emesis,  No chest pain,  No new concerns   Assessment  & Plan :    Active Problems:   Poor compliance with advise    Leukocytosis   CKD (chronic kidney disease) stage 3, GFR 30-59 ml/min (HCC)   Chronic diastolic CHF (congestive heart failure), NYHA class 2 (HCC)   UTI (urinary tract infection)  Brief Narrative:  Juan [name] due to BPH and bladder diverticulum. Recently discharged on 4/9 after AMS due to ? Infection blood vs urine (culture did not grow anything) and was treated with daptomycin. He comes back after refusing SNF and home health with flank pain as well as subjective fever and chills. In the last 6 months, patient has had 6 admission. At one point in March, patient was deemed not to be able to make the medical decision to refuse PICC. Patient was discharged home with son (who has schizophrenia)  Does have some chronic shortness of breath and wears 2L. Does not follow a low salt diet. Says he can walk several blocks with his son. At home he has a wheelchair.  In the ER, his WBC count was elevated with left shift. U/A was had large LE, and WBCs too numerous to count. ER PA spoke with pharmacy and plan is to start [facility].      Assessment & Plan:   Plan:-  1)Possible UTI-patient was empirically treated with Tressie Ellis for 3 days due to prior urine culture with Pseudomonas, however this time around urine culture without definite growth, antibiotics has been discontinued,  patient is afebrile and his leukocytosis may be from MDS, -has [name] catheter due to issues with prostate and urinary diverticulum not allowing him to empty bladder--- urology does not plan to remove [name] until patient more ambulatory/rehabed (Juan [name]), -has appointment in May with urology per patient -catheter changed 2-3 weeks ago per patient  2)Rt Flank Pain-suspect musculoskeletal rather than UTI related,, -PT eval, -pain control with lidocaine patches seem to be helping  3) persistent leukocytosis=-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 Marin Olp  4)Chronic hypoxic respiratory failure -no acute exacerbation at this time , on 2L home O2  5)HFpEF-patient with history of chronic diastolic CHF, appears compensated at this time, continue home dose Lasix, and does not comply with low-salt diet, patient does not like to keep his legs elevated.  Monitor renal function and electrolytes with diuresis  6)OSA:  - CPAP qHS  7)Social/Disposition- Poor compliance with advise and poor home situation -during an admission in March, patient was deemed not to be able to make decision regarding PICC Line but was d/c'd home with son who has schizophrenia, -has been seen by palliative care numerous times in the past--  last 01/2017, -? If qualifies for [facility] home care      DVT prophylaxis:  scd   Code Status: Full Code   Family Communication: son    Consultants:  Dr. Marin Olp- Oncology  Code Status : full   Disposition Plan  : home with [facility]    DVT Prophylaxis  :  SCDs   Lab Results  Component Value Date   PLT 203 09/09/2017    Inpatient Medications  Scheduled Meds: . aspirin EC  81 mg Oral Daily  . B-complex with vitamin C  1 tablet Oral Daily  . ferrous gluconate  324 mg Oral Daily  . furosemide  40 mg Oral q1800  . furosemide  80 mg Oral QAC breakfast  .  hydrALAZINE  25 mg Oral TID  . lidocaine  2 patch Transdermal Q24H  . tamsulosin  0.4 mg Oral QPC supper   Continuous Infusions: PRN Meds:.acetaminophen, ondansetron **OR** ondansetron (ZOFRAN) IV, sodium chloride    Anti-infectives (From admission, onward)   Start     Dose/Rate Route Frequency Ordered Stop   09/05/17 2200  cefTAZidime (FORTAZ) 1 g in sodium chloride 0.9 % 100 mL IVPB  Status:  Discontinued     1 g 200 mL/hr over 30 Minutes Intravenous Every 8 hours 09/05/17 1412 09/08/17 1051   09/05/17 1430  cefTAZidime (FORTAZ) 1 g in sodium chloride 0.9 % 100 mL IVPB     1 g 200 mL/hr over 30 Minutes Intravenous  Once 09/05/17 1357 09/05/17 1700        Objective:   Vitals:   09/08/17 2233 09/09/17 0514 09/09/17 0957 09/09/17 1623  BP: 133/65 (!) 145/58 133/60 139/67  Pulse: 73 72 76 74  Resp: 18 18 18 18   Temp: 98.8 F (37.1 C) 98.6 F (37 C) 98.3 F (36.8 C) 98.5 F (36.9 C)  TempSrc: Oral Oral Oral Oral  SpO2: 97% 98% 100% 97%  Weight:      Height:        Wt Readings from Last 3 Encounters:  09/07/17 85.8 kg (189 lb 2.5 oz)  09/03/17 87.5 kg (193 lb)  08/23/17 81.3 kg (179 lb 3.2 oz)     Intake/Output Summary (Last 24 hours) at 09/09/2017 2011 Last data filed at 09/09/2017 1641 Gross per 24 hour  Intake 840 ml  Output 2351 ml  Net -1511 ml     Physical Exam  Gen:- Awake Alert,  In no apparent distress  HEENT:- [place].AT, No sclera icterus Nose- [place] 2 L/min Neck-Supple Neck,No JVD,.  Lungs-diminished in bases, no wheezing  CV- S1, S2 normal Abd-  +ve B.Sounds, Abd Soft, No tenderness,    Extremity/Skin:-Lower extremity edema with  chronic venous stasis  psych-affect is appropriate, oriented x3 Neuro-no new focal deficits, no tremors   Data Review:   Micro Results Recent Results (from the past 240 hour(s))  Urine culture     Status: Abnormal   Collection Time: 09/05/17  7:32 AM  Result Value Ref Range Status   Specimen Description URINE, RANDOM   Final   Special Requests   Final    NONE Performed at [facility], [place] [address], [place], [place] [address]    Culture MULTIPLE SPECIES PRESENT, SUGGEST RECOLLECTION (A)  Final   Report Status 09/06/2017 FINAL  Final    Radiology Reports Dg Lumbar Spine 2-3 Views  Result Date: 09/06/2017 CLINICAL DATA:  Localized low back pain for 2 days. EXAM: LUMBAR SPINE - 2-3 VIEW COMPARISON:  Body  CT 09/05/2017 FINDINGS: Sclerotic appearance of the axial skeleton. Straightening of the lumbosacral lordosis. No evidence of compression deformity to suggest acute fracture. IMPRESSION: Grossly abnormal sclerotic appearance of the visualized axial skeleton, without evidence of acute fracture. Electronically Signed   By: Fidela Salisbury M.D.   On: 09/06/2017 21:41   Ct Head Wo Contrast  Result Date: 08/18/2017 CLINICAL DATA:  Altered mental status EXAM: CT HEAD WITHOUT CONTRAST TECHNIQUE: Contiguous axial images were obtained from the base of the skull through the vertex without intravenous contrast. COMPARISON:  None. FINDINGS: Brain: No evidence of acute infarction, hemorrhage, extra-axial collection, ventriculomegaly, or mass effect. Generalized cerebral atrophy. Periventricular white matter low attenuation likely secondary to microangiopathy. Vascular: No hyperdense vessel. Skull: Negative for fracture or focal lesion. Sinuses/Orbits: Visualized portions of the orbits are unremarkable. Visualized portions of the paranasal sinuses and mastoid air cells are unremarkable. Other: None. IMPRESSION: No acute intracranial pathology. Electronically Signed   By: Kathreen Devoid   On: 08/18/2017 11:48   Dg Chest Port 1 View  Result Date: 09/05/2017 CLINICAL DATA:  Right flank and bladder pain [name]. History of myelofibrosis. EXAM: PORTABLE CHEST 1 VIEW COMPARISON:  PA and scratch the single view of the chest 08/18/2017. PA and lateral chest 07/16/2016 and 07/06/2017. FINDINGS: The lungs are clear. Heart size is  enlarged. No pneumothorax or pleural effusion. Diffuse sclerosis in all imaged bones is unchanged. IMPRESSION: No acute disease. Diffuse sclerosis in all imaged bones is compatible with the patient's history myelofibrosis. Electronically Signed   By: Inge Rise M.D.   On: 09/05/2017 14:23   Dg Chest Portable 1 View  Result Date: 08/18/2017 CLINICAL DATA:  Altered mental status EXAM: PORTABLE CHEST 1 VIEW COMPARISON:  07/16/2017, 07/06/2017, 06/12/2017, 01/23/2017 FINDINGS: Continued bilateral left greater than right ground-glass opacity. Mild cardiomegaly. Aortic atherosclerosis. No pneumothorax. Abnormal osseous structures with diffuse sclerosis and multiple small lucent lesions. IMPRESSION: 1. Similar appearance of mild interstitial and ground-glass opacity within the bilateral lungs. Stable enlarged cardiomediastinal silhouette. 2. No significant interval change compared to prior radiograph. Electronically Signed   By: Donavan Foil M.D.   On: 08/18/2017 03:11   Ct Renal Stone Study  Result Date: 09/05/2017 CLINICAL DATA:  77 year old male with right flank and bladder pain. 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 EXAM: CT ABDOMEN AND PELVIS WITHOUT CONTRAST TECHNIQUE: Multidetector CT imaging of the abdomen and pelvis was performed following the standard protocol without IV contrast. COMPARISON:  Recent prior CT abdomen/pelvis 08/18/2017; contrast-enhanced CT scan of the abdomen and pelvis 06/07/2017 FINDINGS: Lower chest: Mild dependent atelectasis bilaterally. Otherwise, no acute abnormality within the visualized lower chest. Hepatobiliary: Normal hepatic contour and morphology. No discrete hepatic lesions. Normal appearance of the gallbladder. No intra or extrahepatic biliary ductal dilatation. Pancreas: Unremarkable. No pancreatic ductal dilatation or surrounding inflammatory changes. Spleen: Normal in size without focal abnormality. Adrenals/Urinary Tract: Normal  adrenal glands. Stable simple cyst arising from the lower pole of the right kidney. No evidence of hydronephrosis or nephrolithiasis. The ureters are unremarkable. The bladder is diffusely thick walled. A [name] catheter is in place. Multiple bladder diverticula. The largest measures 4.2 x 4.0 cm. Stomach/Bowel: No focal bowel wall thickening or evidence of obstruction. Colonic diverticulosis without evidence of active diverticulitis. Vascular/Lymphatic: Limited evaluation in the absence of intravenous contrast. Atherosclerotic calcifications present along the abdominal aorta. No aneurysm. No suspicious lymphadenopathy. Reproductive: Prostate is unremarkable. Other: No abdominal wall hernia or abnormality. No abdominopelvic ascites. Musculoskeletal: Diffusely abnormal osseous structures  with heterogeneous sclerosis and innumerable small lucent lesions throughout all visualized bones. These findings are essentially unchanged over the past several studies. IMPRESSION: 1. No acute abnormality within the abdomen or pelvis. 2. [name] catheter well positioned in the bladder. The bladder is diffusely thick walled and contains multiple diverticula. Wall thickening could reflect chronic trabeculation, or acute or chronic cystitis. If the diverticula are incompletely emptying, this could provide a source for urinary stasis and bacterial overgrowth. 3. Colonic diverticular disease without CT evidence of active inflammation. 4. Diffusely abnormal osseous structures with heterogeneous sclerosis and innumerable small lucent lesions. 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 Imaging findings are unchanged across multiple prior studies. Electronically Signed   By: Jacqulynn Cadet M.D.   On: 09/05/2017 09:17   Ct Renal Stone Study  Result Date: 08/18/2017 CLINICAL DATA:  Bladder pain. Discharged 2 days ago for urinary tract infection. History of diabetes and pyelonephritis. EXAM: CT ABDOMEN AND PELVIS WITHOUT  CONTRAST TECHNIQUE: Multidetector CT imaging of the abdomen and pelvis was performed following the standard protocol without IV contrast. COMPARISON:  08/03/2017 FINDINGS: Lower chest: Motion artifact limits examination. Atelectasis in the lung bases. Mild cardiac enlargement. Coronary artery calcifications. Hepatobiliary: No focal liver abnormality is seen. Tiny gallstones are suggested. No gallbladder wall thickening, or biliary dilatation. Pancreas: Unremarkable. No pancreatic ductal dilatation or surrounding inflammatory changes. Spleen: Spleen is enlarged. Adrenals/Urinary Tract: No adrenal gland nodules. Subcentimeter hemorrhagic cyst in the upper pole right kidney. No hydronephrosis. No renal stones identified. Bladder wall is diffusely thickened. This may indicate cystitis. A [name] catheter is in place. Gas in the bladder could arise from catheter insertion. Large right posterior bladder wall diverticulum. Stomach/Bowel: Stomach, small bowel, and colon are decompressed. Diverticulosis of the sigmoid colon without evidence of diverticulitis. Appendix is normal. Vascular/Lymphatic: Aortic atherosclerosis. No enlarged abdominal or pelvic lymph nodes. Reproductive: Prostate is unremarkable. Other: Diffuse edema in the subcutaneous fat and mesentery. No free or loculated pelvic or abdominal fluid collections. No free air. Soft tissue nodule in the low pelvic subcutaneous fat anteriorly probably represents a sebaceous cyst. Groin lymph nodes are not pathologically enlarged, likely reactive. Musculoskeletal: Diffuse heterogeneous bone sclerosis consistent with known myeloproliferative disorder. IMPRESSION: 1. Diffuse bladder wall thickening likely indicating cystitis. Large bladder diverticula. 2. No renal or ureteral stone or obstruction. 3. Aortic atherosclerosis. 4. Enlarged spleen. 5. Diffuse edema throughout the subcutaneous and mesenteric fat. No loculated fluid collections. 6. Diffuse heterogeneous bone  sclerosis consistent with known myeloproliferative disorder. Electronically Signed   By: Lucienne Capers M.D.   On: 08/18/2017 05:15     CBC Recent Labs  Lab 09/05/17 0732 09/06/17 0457 09/07/17 0328 09/08/17 0501 09/09/17 0336  WBC 12.7* 13.0* 13.9* 16.0* 15.8*  HGB 9.1* 8.6* 8.5* 9.5* 8.4*  HCT 31.0* 28.3* 28.2* 30.0* 28.3*  PLT 209 197 175 136* 203  MCV 86.8 86.5 86.2 83.3 86.3  MCH 25.5* 26.3 26.0 26.4 25.6*  MCHC 29.4* 30.4 30.1 31.7 29.7*  RDW 17.0* 17.3* 16.9* 17.1* 16.9*  LYMPHSABS 1.5  --   --   --  1.6  MONOABS 3.0*  --   --   --  1.9*  EOSABS 0.1  --   --   --  0.0  BASOSABS 0.4*  --   --   --  0.2*    Chemistries  Recent Labs  Lab 09/05/17 0732 09/06/17 0457 09/07/17 0328 09/08/17 0501 09/09/17 0336  NA 139 137 139 142 140  K 4.2 4.3 4.4  4.7 4.6  CL 102 102 102 106 105  CO2 25 27 29 28 27   GLUCOSE 93 103* 107* 100* 99  BUN 48* 42* 43* 45* 46*  CREATININE 1.23 1.17 1.27* 1.22 1.22  CALCIUM 9.6 9.3 9.3 9.5 9.6   ------------------------------------------------------------------------------------------------------------------ No results for input(s): CHOL, HDL, LDLCALC, TRIG, CHOLHDL, LDLDIRECT in the last 72 hours.  Lab Results  Component Value Date   HGBA1C 5.1 08/18/2017   ------------------------------------------------------------------------------------------------------------------ No results for input(s): TSH, T4TOTAL, T3FREE, THYROIDAB in the last 72 hours.  Invalid input(s): FREET3 ------------------------------------------------------------------------------------------------------------------ No results for input(s): VITAMINB12, FOLATE, FERRITIN, TIBC, IRON, RETICCTPCT in the last 72 hours.  Coagulation profile No results for input(s): INR, PROTIME in the last 168 hours.  No results for input(s): DDIMER in the last 72 hours.  Cardiac Enzymes No results for input(s): CKMB, TROPONINI, MYOGLOBIN in the last 168 hours.  Invalid  input(s): CK ------------------------------------------------------------------------------------------------------------------    Component Value Date/Time   BNP 499.9 (H) 07/16/2017 8889     Roxan Hockey M.D on 09/09/2017 at 8:11 PM  Between 7am to 7pm - Pager - [phone]  After 7pm go to [email or web address] - password [email or web address]  [facility] -  Office  [phone]   Voice Recognition Viviann Spare dictation system was used to create this note, attempts have been made to correct errors. Please contact the author with questions and/or clarifications.

## 2017-09-10 DIAGNOSIS — Z87440 Personal history of urinary (tract) infections: Secondary | ICD-10-CM

## 2017-09-10 DIAGNOSIS — N189 Chronic kidney disease, unspecified: Secondary | ICD-10-CM

## 2017-09-10 DIAGNOSIS — Z87891 Personal history of nicotine dependence: Secondary | ICD-10-CM

## 2017-09-10 DIAGNOSIS — D631 Anemia in chronic kidney disease: Secondary | ICD-10-CM

## 2017-09-10 DIAGNOSIS — C946 Myelodysplastic disease, not classified: Secondary | ICD-10-CM

## 2017-09-10 LAB — CBC WITH DIFFERENTIAL/PLATELET
BASOS ABS: 0.8 10*3/uL — AB (ref 0.0–0.1)
BASOS PCT: 4 %
EOS PCT: 1 %
Eosinophils Absolute: 0.2 10*3/uL (ref 0.0–0.7)
HCT: 30.8 % — ABNORMAL LOW (ref 39.0–52.0)
Hemoglobin: 9 g/dL — ABNORMAL LOW (ref 13.0–17.0)
LYMPHS ABS: 3.4 10*3/uL (ref 0.7–4.0)
LYMPHS PCT: 16 %
MCH: 25.4 pg — ABNORMAL LOW (ref 26.0–34.0)
MCHC: 29.2 g/dL — ABNORMAL LOW (ref 30.0–36.0)
MCV: 87 fL (ref 78.0–100.0)
MONOS PCT: 13 %
Monocytes Absolute: 2.7 10*3/uL — ABNORMAL HIGH (ref 0.1–1.0)
NEUTROS ABS: 13.9 10*3/uL — AB (ref 1.7–7.7)
Neutrophils Relative %: 66 %
PLATELETS: 264 10*3/uL (ref 150–400)
RBC: 3.54 MIL/uL — ABNORMAL LOW (ref 4.22–5.81)
RDW: 17 % — AB (ref 11.5–15.5)
WBC: 21 10*3/uL — ABNORMAL HIGH (ref 4.0–10.5)

## 2017-09-10 LAB — COMPREHENSIVE METABOLIC PANEL
ALBUMIN: 2.7 g/dL — AB (ref 3.5–5.0)
ALT: 13 U/L — ABNORMAL LOW (ref 17–63)
ANION GAP: 12 (ref 5–15)
AST: 24 U/L (ref 15–41)
Alkaline Phosphatase: 98 U/L (ref 38–126)
BUN: 45 mg/dL — ABNORMAL HIGH (ref 6–20)
CHLORIDE: 102 mmol/L (ref 101–111)
CO2: 25 mmol/L (ref 22–32)
Calcium: 9.8 mg/dL (ref 8.9–10.3)
Creatinine, Ser: 1.19 mg/dL (ref 0.61–1.24)
GFR calc Af Amer: >60 mL/min (ref >60)
GFR, EST NON AFRICAN AMERICAN: 58 mL/min — AB (ref >60)
GLUCOSE: 102 mg/dL — AB (ref 65–99)
POTASSIUM: 5.1 mmol/L (ref 3.5–5.1)
Sodium: 139 mmol/L (ref 135–145)
Total Bilirubin: 0.4 mg/dL (ref 0.3–1.2)
Total Protein: 5.8 g/dL — ABNORMAL LOW (ref 6.5–8.1)

## 2017-09-10 LAB — PREPARE RBC (CROSSMATCH)

## 2017-09-10 MED ORDER — SODIUM CHLORIDE 0.9 % IV SOLN
Freq: Once | INTRAVENOUS | Status: AC
  Administered 2017-09-11: 07:00:00 via INTRAVENOUS

## 2017-09-10 MED ORDER — FUROSEMIDE 10 MG/ML IJ SOLN
20.0000 mg | Freq: Once | INTRAMUSCULAR | Status: AC
  Administered 2017-09-11: 20 mg via INTRAVENOUS

## 2017-09-10 MED ORDER — HEPARIN SODIUM (PORCINE) 5000 UNIT/ML IJ SOLN
5000.0000 [IU] | Freq: Three times a day (TID) | INTRAMUSCULAR | Status: DC
  Administered 2017-09-10 – 2017-09-12 (×5): 5000 [IU] via SUBCUTANEOUS
  Filled 2017-09-10 (×5): qty 1

## 2017-09-10 MED ORDER — DARBEPOETIN ALFA 300 MCG/0.6ML IJ SOSY
300.0000 ug | PREFILLED_SYRINGE | Freq: Once | INTRAMUSCULAR | Status: DC
  Filled 2017-09-10: qty 0.6

## 2017-09-10 MED ORDER — FUROSEMIDE 10 MG/ML IJ SOLN
30.0000 mg | Freq: Once | INTRAMUSCULAR | Status: AC
  Administered 2017-09-10: 30 mg via INTRAVENOUS
  Filled 2017-09-10: qty 4

## 2017-09-10 MED ORDER — FUROSEMIDE 10 MG/ML IJ SOLN
20.0000 mg | Freq: Once | INTRAMUSCULAR | Status: AC
  Administered 2017-09-11: 20 mg via INTRAVENOUS
  Filled 2017-09-10: qty 2

## 2017-09-10 MED ORDER — FUROSEMIDE 10 MG/ML IJ SOLN
20.0000 mg | Freq: Once | INTRAMUSCULAR | Status: DC
  Filled 2017-09-10: qty 2

## 2017-09-10 NOTE — Progress Notes (Signed)
Physical Therapy Treatment Patient Details Name: Juan [name] MRN: [id number] DOB: 1940-05-29 Today's Date: 09/10/2017    History of Present Illness Juan [name] is a 77 y.o. 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  Recently discharged on 4/9 after AMS due to ? Infection blood vs urine (culture did not grow anything) and was treated with daptomycin.  He comes back after refusing SNF and home health with flank pain as well as subjective fever and chills.   In the last 6 months, patient has had 6 admission.  At one point in March, patient was deemed not to be able to make the medical decision to refuse PICC.  Patient was discharged home with son (who has schizophrenia)    PT Comments    Pt remains limited with functional mobility secondary to generalized weakness and overall poor endurance. Pt with seven admissions in the past six months with lots of barriers noted in Case Manager's note regarding pt's f/u after d/c. Pt would continue to benefit from skilled physical therapy services at this time while admitted and after d/c to address the below listed limitations in order to improve overall safety and independence with functional mobility.   Follow Up Recommendations  SNF     Equipment Recommendations  None recommended by PT    Recommendations for Other Services       Precautions / Restrictions Precautions Precautions: Fall Restrictions Weight Bearing Restrictions: No    Mobility  Bed Mobility Overal bed mobility: Needs Assistance Bed Mobility: Sit to Supine       Sit to supine: Mod assist   General bed mobility comments: assist to return bilateral LEs onto bed  Transfers Overall transfer level: Needs assistance Equipment used: Rolling walker (2 wheeled) Transfers: Sit to/from Stand Sit to Stand: Mod assist         General transfer comment: assist to power into standing from recliner chair; good hand  placement  Ambulation/Gait Ambulation/Gait assistance: Min guard Ambulation Distance (Feet): 5 Feet Assistive device: Rolling walker (2 wheeled) Gait Pattern/deviations: Step-through pattern;Decreased stride length;Trunk flexed Gait velocity: decreased Gait velocity interpretation: <1.8 ft/sec, indicate of risk for recurrent falls General Gait Details: pt ambulated a short distance from recliner chair to bed   Stairs             Wheelchair Mobility    Modified Rankin (Stroke Patients Only)       Balance Overall balance assessment: Needs assistance Sitting-balance support: Feet supported;No upper extremity supported Sitting balance-Leahy Scale: Good     Standing balance support: During functional activity;Bilateral upper extremity supported Standing balance-Leahy Scale: Poor Standing balance comment: reliant on bilateral UEs on RW                            Cognition Arousal/Alertness: Awake/alert Behavior During Therapy: WFL for tasks assessed/performed Overall Cognitive Status: Impaired/Different from baseline Area of Impairment: Safety/judgement;Problem solving                         Safety/Judgement: Decreased awareness of deficits;Decreased awareness of safety   Problem Solving: Slow processing;Difficulty sequencing;Requires verbal cues        Exercises      General Comments        Pertinent Vitals/Pain Pain Assessment: No/denies pain    Home Living  Prior Function            PT Goals (current goals can now be found in the care plan section) Acute Rehab PT Goals PT Goal Formulation: With patient Time For Goal Achievement: 09/22/17 Potential to Achieve Goals: Good Progress towards PT goals: Progressing toward goals    Frequency    Min 3X/week      PT Plan Current plan remains appropriate    Co-evaluation              AM-PAC PT "6 Clicks" Daily Activity  Outcome Measure   Difficulty turning over in bed (including adjusting bedclothes, sheets and blankets)?: A Little Difficulty moving from lying on back to sitting on the side of the bed? : Unable Difficulty sitting down on and standing up from a chair with arms (e.g., wheelchair, bedside commode, etc,.)?: Unable Help needed moving to and from a bed to chair (including a wheelchair)?: A Little Help needed walking in hospital room?: A Little Help needed climbing 3-5 steps with a railing? : A Lot 6 Click Score: 13    End of Session Equipment Utilized During Treatment: Oxygen Activity Tolerance: Patient limited by fatigue Patient left: in bed;with call Berisha/phone within reach;with bed alarm set Nurse Communication: Mobility status PT Visit Diagnosis: Other abnormalities of gait and mobility (R26.89)     Time: 5789-7847 PT Time Calculation (min) (ACUTE ONLY): 17 min  Charges:  $Therapeutic Activity: 8-22 mins                    G Codes:       [place], PT, DPT [place] 09/10/2017, 4:12 PM

## 2017-09-10 NOTE — Consult Note (Signed)
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 Recurrent UTI  : I have a urine infection.  HPI: Mr. Rodelo is well-known to me.  Is a nice 77 year old African-American gentleman.  He has a long standing myeloproliferative disorder.  I would have thought that he would have transformed to acute leukemia a couple years ago.  For some reason, his disease has really been holding steady.  He is not been able to come to our office he says because of finances.  I told him that even if he cannot pay, we will still take care of him.  He has been admitted a couple times because of a urinary tract infection.About 3 weeks ago, he had a vancomycin-resistant enterococcus.  Apparently, he has gotten over this.  He has had chronic anemia.  His blood count yesterday was 8.4.  As such, I think that he probably would benefit from transfusion.  He has a very low erythropoietin level and as such, he probably is not going to be able to make blood on his own all that well.  I will also give a dose of.  He actually looks pretty well.  He has had no bleeding.  He has had no nausea or vomiting.  He has had no issues with pain.  It does not look like he is lost weight.  Looks like his appetite is doing okay.  He has had no nausea or vomiting.  He is on no cough or shortness of breath.  He had a CT scan done on April 21.  This is a kidney stone study.  This was unremarkable.  He did not have any splenomegaly.  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     Past Medical History:  Diagnosis Date  . Allergic rhinitis   . Anemia   . Ascending aortic aneurysm ([place]) 03/2014   4.3cm on CT scan  . CAD (coronary artery disease)    dx elsewheer in past, no documentation. Non-ischemic myoview 2007  . Chronic diastolic CHF (congestive heart failure), NYHA class 2 (H[facility])    Normal  EF w/ grade 1 dd by echo 12/2015  . Chronic respiratory failure with hypoxia/2L at home    "2L; all the time" (05/03/2017)  . CKD (chronic kidney disease), stage III ([place])   . Edema    R>L leg, u/s 5-12 neg for DVT  . Hemorrhoid   . History of hydronephrosis   . History of Splenic infarct   . History of thrombocytosis   . Hypertension   . Iron deficiency anemia due to chronic blood loss 03/31/2017  . 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 Migraine    "once/wk at least" (07/11/2013)  . Moderate to severe pulmonary hypertension ([place])   . Myeloproliferative disease ([place])   . Pyelonephritis 04/29/2017  . Recurrent Pseudomonas urinary tract infection   . Shortness of breath   . Sinus congestion   . Sleep apnea, obstructive    at some point used CPAP, was d/c  years ago  . Type II diabetes mellitus ([place])   . UTI (urinary tract infection) 06/2017  . UTI (urinary tract infection) 07/2017  :  Past Surgical History:  Procedure Laterality Date  . FLEXIBLE SIGMOIDOSCOPY N/A 06/08/2017   Procedure: FLEXIBLE SIGMOIDOSCOPY;  Surgeon: Carol Ada, MD;  Location: [facility];  Service: Endoscopy;  Laterality: N/A;  . IR FLUORO GUIDE CV LINE RIGHT  05/07/2017  . IR FLUORO RM 30-60 MIN  05/07/2017  . IR US GUIDE VASC ACCESS RIGHT  05/07/2017  . TOE SURGERY Right    "tried to straighten out big toe" (07/11/2013)  :   Current Facility-Administered Medications:  .  0.9 %  sodium chloride infusion, , Intravenous, Once, Osman Calzadilla, Rudell Cobb, MD .  acetaminophen (TYLENOL) tablet 650 mg, 650 mg, Oral, Q6H PRN, Geradine Girt, DO, 650 mg at 09/10/17 2831 .  aspirin EC tablet 81 mg, 81 mg, Oral, Daily, Eulogio Bear U, DO, 81 mg at 09/09/17 5176 .  B-complex with vitamin C tablet 1 tablet, 1 tablet, Oral, Daily, Geradine Girt, DO, 1 tablet at 09/09/17 1607 .  Darbepoetin Alfa ([name]) injection 300 mcg, 300 mcg, Subcutaneous, Once, Merriam Brandner, Rudell Cobb, MD .  ferrous gluconate [employer]) tablet 324 mg, 324 mg,  Oral, Daily, Vann, Jessica U, DO, 324 mg at 09/09/17 1047 .  furosemide (LASIX) injection 20 mg, 20 mg, Intravenous, Once, Hokulani Rogel, Rudell Cobb, MD .  furosemide (LASIX) injection 30 mg, 30 mg, Intravenous, Once, Justyna Timoney, Rudell Cobb, MD .  furosemide (LASIX) tablet 40 mg, 40 mg, Oral, q1800, Vann, Jessica U, DO, 40 mg at 09/09/17 1718 .  furosemide (LASIX) tablet 80 mg, 80 mg, Oral, QAC breakfast, Vann, Jessica U, DO, 80 mg at 09/09/17 3710 .  hydrALAZINE (APRESOLINE) tablet 25 mg, 25 mg, Oral, TID, Vann, Jessica U, DO, 25 mg at 09/09/17 2205 .  lidocaine (LIDODERM) 5 % 2 patch, 2 patch, Transdermal, Q24H, Vann, Jessica U, DO, 2 patch at 09/09/17 1048 .  ondansetron (ZOFRAN) tablet 4 mg, 4 mg, Oral, Q6H PRN **OR** ondansetron (ZOFRAN) injection 4 mg, 4 mg, Intravenous, Q6H PRN, Vann, Jessica U, DO .  sodium chloride (OCEAN) 0.65 % nasal spray 1 spray, 1 spray, Each Nare, PRN, Eliseo Squires, Jessica U, DO .  tamsulosin (FLOMAX) capsule 0.4 mg, 0.4 mg, Oral, QPC supper, Vann, Jessica U, DO, 0.4 mg at 09/09/17 1718:  . aspirin EC  81 mg Oral Daily  . B-complex with vitamin C  1 tablet Oral Daily  . darbepoetin ([name]) injection - NON-DIALYSIS  300 mcg Subcutaneous Once  . ferrous gluconate  324 mg Oral Daily  . furosemide  20 mg Intravenous Once  . furosemide  30 mg Intravenous Once  . furosemide  40 mg Oral q1800  . furosemide  80 mg Oral QAC breakfast  . hydrALAZINE  25 mg Oral TID  . lidocaine  2 patch Transdermal Q24H  . tamsulosin  0.4 mg Oral QPC supper  :  No Known Allergies:  Family History  Problem Relation Age of Onset  . Schizophrenia Son   . Mental illness Daughter   . Colon cancer Neg Hx   . Prostate cancer Neg Hx   . Heart attack Neg Hx   . Diabetes Neg Hx   :  Social History   Socioeconomic History  . Marital status: Widowed    Spouse name: Not on file  . Number of children: 2  . Years of education: Not on file  . Highest education level: Not on file  Occupational History   . Occupation: retired     Fish farm manager: RETIRED  Social Needs  . Financial resource strain: Not on file  . Food insecurity:    Worry: Not on file    Inability: Not on file  . Transportation needs:    Medical: Not on file    Non-medical: Not on file  Tobacco Use  .  Smoking status: Former Smoker    Packs/day: 0.25    Years: 12.00    Pack years: 3.00    Types: Cigarettes    Last attempt to quit: 05/18/1966    Years since quitting: 51.3  . Smokeless tobacco: Never Used  . Tobacco comment: quit smoking 45 years ago  Substance and Sexual Activity  . Alcohol use: No    Alcohol/week: 0.0 oz  . Drug use: No  . Sexual activity: Yes  Lifestyle  . Physical activity:    Days per week: Not on file    Minutes per session: Not on file  . Stress: Not on file  Relationships  . Social connections:    Talks on phone: Not on file    Gets together: Not on file    Attends religious service: Not on file    Active member of club or organization: Not on file    Attends meetings of clubs or organizations: Not on file    Relationship status: Not on file  . Intimate partner violence:    Fear of current or ex partner: Not on file    Emotionally abused: Not on file    Physically abused: Not on file    Forced sexual activity: Not on file  Other Topics Concern  . Not on file  Social History Narrative   Moved from [place] 2006   Widow 2007   Son lives with him       Admitted [facility]   Former smoker-stopped 1968   Alcohol none   Full Code  :  Pertinent items are noted in HPI.  Exam: Patient Vitals for the past 24 hrs:  BP Temp Temp src Pulse Resp SpO2 Weight  09/10/17 0602 140/73 98.4 F (36.9 C) Oral 73 19 99 % -  09/09/17 2148 (!) 151/63 98.7 F (37.1 C) Oral 75 18 96 % 189 lb 2.5 oz (85.8 kg)  09/09/17 1623 139/67 98.5 F (36.9 C) Oral 74 18 97 % -  09/09/17 0957 133/60 98.3 F (36.8 C) Oral 76 18 100 % -     Recent Labs    09/08/17 0501 09/09/17 0336   WBC 16.0* 15.8*  HGB 9.5* 8.4*  HCT 30.0* 28.3*  PLT 136* 203   Recent Labs    09/08/17 0501 09/09/17 0336  NA 142 140  K 4.7 4.6  CL 106 105  CO2 28 27  GLUCOSE 100* 99  BUN 45* 46*  CREATININE 1.22 1.22  CALCIUM 9.5 9.6    Blood smear review:   Pathology:    Assessment and Plan: Mr. Goshert is a 77 year old white male.  He has a chronic myeloproliferative disorder.  It is amazing that he is not transformed to acute leukemia.  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  I will go ahead and transfuse him 2 units of blood.  I think this would really help him out if he were to go home over the weekend.  I talked him about this.  He has had transfusion before.  He is definitely agreeable.  I also told him about [name].  We have given him [name] before.  I again I think [name] would help since his erythropoietin level is only 6.  As always it is nice to see him.  Hopefully he will be able to make it out to our office.  Again I told him that even if he had limited finances, we would still take care of him.  Other than this made him feel better.  Lattie Haw, MD  Proverbs 17:17

## 2017-09-10 NOTE — Progress Notes (Signed)
Patient Demographics:    Juan [name], is a 77 y.o. male, DOB - [birth year], ZDG:[id number]  Admit date - 09/05/2017   Admitting Physician Geradine Girt, DO  Outpatient Primary MD for the patient is Colon Branch, MD  LOS - 5   Chief Complaint  Patient presents with  . Recurrent UTI        Subjective:    Juan [name] today has no fevers, no emesis,  No chest pain,   Eating ok  Assessment  & Plan :    Active Problems:   Poor compliance with advise    Leukocytosis   CKD (chronic kidney disease) stage 3, GFR 30-59 ml/min (HCC)   Chronic diastolic CHF (congestive heart failure), NYHA class 2 (HCC)   UTI (urinary tract infection)  Brief Narrative:  Juan [name] a 77 y.o.malewith medical history significant ofchronic foley due to BPH and bladder diverticulum. Recently discharged on 4/9 after AMS due to ? Infection blood vs urine (culture did not grow anything) and was treated with daptomycin. He comes back after refusing SNF and home health with flank pain as well as subjective fever and chills. In the last 6 months, patient has had 6 admission. At one point in March, patient was deemed not to be able to make the medical decision to refuse PICC. Patient was discharged home with son (who has schizophrenia)  Does have some chronic shortness of breath and wears 2L. Does not follow a low salt diet. Says he can walk several blocks with his son. At home he has a wheelchair.  In the ER, his WBC count was elevated with left shift. U/A was had large LE, and WBCs too numerous to count. ER PA spoke with pharmacy and plan is to start [facility].      Assessment & Plan:   Plan:-  1)Possible UTI- patient was empirically treated with Tressie Ellis for 3 days due to prior urine culture with Pseudomonas, however this time around urine culture without definite growth, antibiotics has been discontinued, patient  is afebrile and his leukocytosis may be from MDS, -has foley catheter due to issues with prostate and urinary diverticulum not allowing him to empty bladder--- urology does not plan to remove foley until patient more ambulatory/rehabed ([place]), -has appointment in May with urology per patient -catheter changed 2-3 weeks ago per patient  2)Rt Flank Pain-suspect musculoskeletal rather than UTI related,, -PT eval, -pain control with lidocaine patches seem to be helping  3)MDS-oncology consult from Dr. Martha Clan appreciated, he recommends transfusion of 2 units of\for patient's anemia, persistent leukocytosis=-suspect secondary to MDS, can not rule out transformation to leukemia,,   4)Chronic hypoxic respiratory failure -no acute exacerbation at this time , on 2L home O2  5)HFpEF-patient with history of chronic diastolic CHF, appears compensated at this time, continue home dose Lasix, and does not comply with low-salt diet, patient does not like to keep his legs elevated.  Monitor renal function and electrolytes with diuresis  6)OSA:  - CPAP qHS  7)Social/Disposition- Poor compliance with advise and poor home situation, -during an admission in March, patient was deemed not to be able to make decision regarding PICC Line but was d/c'd home with son who has schizophrenia, -has been seen by palliative care numerous  times in the past-- last 01/2017, -eval appreciated to recommend skilled nursing facility/SNF placement     DVT prophylaxis:  scd   Code Status: Full Code   Family Communication: son    Consultants:  Dr. Marin Olp- Oncology  Code Status : full   Disposition Plan  : SNF   DVT Prophylaxis  :  SCDs /heparin  Lab Results  Component Value Date   PLT 264 09/10/2017    Inpatient Medications  Scheduled Meds: . aspirin EC  81 mg Oral Daily  . B-complex with vitamin C  1 tablet Oral Daily  . darbepoetin ([name]) injection - NON-DIALYSIS  300 mcg Subcutaneous  Once  . ferrous gluconate  324 mg Oral Daily  . furosemide  20 mg Intravenous Once  . furosemide  20 mg Intravenous Once  . furosemide  20 mg Intravenous Once  . furosemide  40 mg Oral q1800  . furosemide  80 mg Oral QAC breakfast  . hydrALAZINE  25 mg Oral TID  . lidocaine  2 patch Transdermal Q24H  . tamsulosin  0.4 mg Oral QPC supper   Continuous Infusions: . sodium chloride     PRN Meds:.acetaminophen, ondansetron **OR** ondansetron (ZOFRAN) IV, sodium chloride    Anti-infectives (From admission, onward)   Start     Dose/Rate Route Frequency Ordered Stop   09/05/17 2200  cefTAZidime (FORTAZ) 1 g in sodium chloride 0.9 % 100 mL IVPB  Status:  Discontinued     1 g 200 mL/hr over 30 Minutes Intravenous Every 8 hours 09/05/17 1412 09/08/17 1051   09/05/17 1430  cefTAZidime (FORTAZ) 1 g in sodium chloride 0.9 % 100 mL IVPB     1 g 200 mL/hr over 30 Minutes Intravenous  Once 09/05/17 1357 09/05/17 1700        Objective:   Vitals:   09/10/17 0602 09/10/17 1033 09/10/17 1034 09/10/17 1622  BP: 140/73  (!) 141/71 (!) 158/70  Pulse: 73 73 75 79  Resp: 19 20  18   Temp: 98.4 F (36.9 C) 98 F (36.7 C)  98.8 F (37.1 C)  TempSrc: Oral Oral  Oral  SpO2: 99% 100% 100% 100%  Weight:      Height:        Wt Readings from Last 3 Encounters:  09/09/17 85.8 kg (189 lb 2.5 oz)  09/03/17 87.5 kg (193 lb)  08/23/17 81.3 kg (179 lb 3.2 oz)     Intake/Output Summary (Last 24 hours) at 09/10/2017 1800 Last data filed at 09/10/2017 1300 Gross per 24 hour  Intake 600 ml  Output 1775 ml  Net -1175 ml     Physical Exam  Gen:- Awake Alert,  In no apparent distress  HEENT:- [place].AT, No sclera icterus Nose- [place] 2 L/min Neck-Supple Neck,No JVD,.  Lungs-diminished in bases, no wheezing  CV- S1, S2 normal Abd-  +ve B.Sounds, Abd Soft, No tenderness,    Extremity/Skin:-Lower extremity edema with  chronic venous stasis  psych-affect is appropriate, oriented x3 Neuro-no new focal  deficits, no tremors   Data Review:   Micro Results Recent Results (from the past 240 hour(s))  Urine culture     Status: Abnormal   Collection Time: 09/05/17  7:32 AM  Result Value Ref Range Status   Specimen Description URINE, RANDOM  Final   Special Requests   Final    NONE Performed at [facility], [place] [address], [place], [place] [address]    Culture MULTIPLE SPECIES PRESENT, SUGGEST RECOLLECTION (A)  Final   Report Status 09/06/2017 FINAL  Final    Radiology Reports Dg Lumbar Spine 2-3 Views  Result Date: 09/06/2017 CLINICAL DATA:  Localized low back pain for 2 days. EXAM: LUMBAR SPINE - 2-3 VIEW COMPARISON:  Body CT 09/05/2017 FINDINGS: Sclerotic appearance of the axial skeleton. Straightening of the lumbosacral lordosis. No evidence of compression deformity to suggest acute fracture. IMPRESSION: Grossly abnormal sclerotic appearance of the visualized axial skeleton, without evidence of acute fracture. Electronically Signed   By: Fidela Salisbury M.D.   On: 09/06/2017 21:41   Ct Head Wo Contrast  Result Date: 08/18/2017 CLINICAL DATA:  Altered mental status EXAM: CT HEAD WITHOUT CONTRAST TECHNIQUE: Contiguous axial images were obtained from the base of the skull through the vertex without intravenous contrast. COMPARISON:  None. FINDINGS: Brain: No evidence of acute infarction, hemorrhage, extra-axial collection, ventriculomegaly, or mass effect. Generalized cerebral atrophy. Periventricular white matter low attenuation likely secondary to microangiopathy. Vascular: No hyperdense vessel. Skull: Negative for fracture or focal lesion. Sinuses/Orbits: Visualized portions of the orbits are unremarkable. Visualized portions of the paranasal sinuses and mastoid air cells are unremarkable. Other: None. IMPRESSION: No acute intracranial pathology. Electronically Signed   By: Kathreen Devoid   On: 08/18/2017 11:48   Dg Chest Port 1 View  Result Date: 09/05/2017 CLINICAL DATA:  Right  flank and bladder pain today. History of myelofibrosis. EXAM: PORTABLE CHEST 1 VIEW COMPARISON:  PA and scratch the single view of the chest 08/18/2017. PA and lateral chest 07/16/2016 and 07/06/2017. FINDINGS: The lungs are clear. Heart size is enlarged. No pneumothorax or pleural effusion. Diffuse sclerosis in all imaged bones is unchanged. IMPRESSION: No acute disease. Diffuse sclerosis in all imaged bones is compatible with the patient's history myelofibrosis. Electronically Signed   By: Inge Rise M.D.   On: 09/05/2017 14:23   Dg Chest Portable 1 View  Result Date: 08/18/2017 CLINICAL DATA:  Altered mental status EXAM: PORTABLE CHEST 1 VIEW COMPARISON:  07/16/2017, 07/06/2017, 06/12/2017, 01/23/2017 FINDINGS: Continued bilateral left greater than right ground-glass opacity. Mild cardiomegaly. Aortic atherosclerosis. No pneumothorax. Abnormal osseous structures with diffuse sclerosis and multiple small lucent lesions. IMPRESSION: 1. Similar appearance of mild interstitial and ground-glass opacity within the bilateral lungs. Stable enlarged cardiomediastinal silhouette. 2. No significant interval change compared to prior radiograph. Electronically Signed   By: Donavan Foil M.D.   On: 08/18/2017 03:11   Ct Renal Stone Study  Result Date: 09/05/2017 CLINICAL DATA:  77 year old male with right flank and bladder pain. 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 EXAM: CT ABDOMEN AND PELVIS WITHOUT CONTRAST TECHNIQUE: Multidetector CT imaging of the abdomen and pelvis was performed following the standard protocol without IV contrast. COMPARISON:  Recent prior CT abdomen/pelvis 08/18/2017; contrast-enhanced CT scan of the abdomen and pelvis 06/07/2017 FINDINGS: Lower chest: Mild dependent atelectasis bilaterally. Otherwise, no acute abnormality within the visualized lower chest. Hepatobiliary: Normal hepatic contour and morphology. No discrete hepatic lesions. Normal appearance of  the gallbladder. No intra or extrahepatic biliary ductal dilatation. Pancreas: Unremarkable. No pancreatic ductal dilatation or surrounding inflammatory changes. Spleen: Normal in size without focal abnormality. Adrenals/Urinary Tract: Normal adrenal glands. Stable simple cyst arising from the lower pole of the right kidney. No evidence of hydronephrosis or nephrolithiasis. The ureters are unremarkable. The bladder is diffusely thick walled. A Foley catheter is in place. Multiple bladder diverticula. The largest measures 4.2 x 4.0 cm. Stomach/Bowel: No focal bowel wall thickening or evidence of obstruction. Colonic diverticulosis without evidence of  active diverticulitis. Vascular/Lymphatic: Limited evaluation in the absence of intravenous contrast. Atherosclerotic calcifications present along the abdominal aorta. No aneurysm. No suspicious lymphadenopathy. Reproductive: Prostate is unremarkable. Other: No abdominal wall hernia or abnormality. No abdominopelvic ascites. Musculoskeletal: Diffusely abnormal osseous structures with heterogeneous sclerosis and innumerable small lucent lesions throughout all visualized bones. These findings are essentially unchanged over the past several studies. IMPRESSION: 1. No acute abnormality within the abdomen or pelvis. 2. Foley catheter well positioned in the bladder. The bladder is diffusely thick walled and contains multiple diverticula. Wall thickening could reflect chronic trabeculation, or acute or chronic cystitis. If the diverticula are incompletely emptying, this could provide a source for urinary stasis and bacterial overgrowth. 3. Colonic diverticular disease without CT evidence of active inflammation. 4. Diffusely abnormal osseous structures with heterogeneous sclerosis and innumerable small lucent lesions. 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 Imaging findings are unchanged across multiple prior studies. Electronically Signed   By: Jacqulynn Cadet M.D.   On: 09/05/2017 09:17   Ct Renal Stone Study  Result Date: 08/18/2017 CLINICAL DATA:  Bladder pain. Discharged 2 days ago for urinary tract infection. History of diabetes and pyelonephritis. EXAM: CT ABDOMEN AND PELVIS WITHOUT CONTRAST TECHNIQUE: Multidetector CT imaging of the abdomen and pelvis was performed following the standard protocol without IV contrast. COMPARISON:  08/03/2017 FINDINGS: Lower chest: Motion artifact limits examination. Atelectasis in the lung bases. Mild cardiac enlargement. Coronary artery calcifications. Hepatobiliary: No focal liver abnormality is seen. Tiny gallstones are suggested. No gallbladder wall thickening, or biliary dilatation. Pancreas: Unremarkable. No pancreatic ductal dilatation or surrounding inflammatory changes. Spleen: Spleen is enlarged. Adrenals/Urinary Tract: No adrenal gland nodules. Subcentimeter hemorrhagic cyst in the upper pole right kidney. No hydronephrosis. No renal stones identified. Bladder wall is diffusely thickened. This may indicate cystitis. A Foley catheter is in place. Gas in the bladder could arise from catheter insertion. Large right posterior bladder wall diverticulum. Stomach/Bowel: Stomach, small bowel, and colon are decompressed. Diverticulosis of the sigmoid colon without evidence of diverticulitis. Appendix is normal. Vascular/Lymphatic: Aortic atherosclerosis. No enlarged abdominal or pelvic lymph nodes. Reproductive: Prostate is unremarkable. Other: Diffuse edema in the subcutaneous fat and mesentery. No free or loculated pelvic or abdominal fluid collections. No free air. Soft tissue nodule in the low pelvic subcutaneous fat anteriorly probably represents a sebaceous cyst. Groin lymph nodes are not pathologically enlarged, likely reactive. Musculoskeletal: Diffuse heterogeneous bone sclerosis consistent with known myeloproliferative disorder. IMPRESSION: 1. Diffuse bladder wall thickening likely indicating cystitis.  Large bladder diverticula. 2. No renal or ureteral stone or obstruction. 3. Aortic atherosclerosis. 4. Enlarged spleen. 5. Diffuse edema throughout the subcutaneous and mesenteric fat. No loculated fluid collections. 6. Diffuse heterogeneous bone sclerosis consistent with known myeloproliferative disorder. Electronically Signed   By: Lucienne Capers M.D.   On: 08/18/2017 05:15     CBC Recent Labs  Lab 09/05/17 0732 09/06/17 0457 09/07/17 0328 09/08/17 0501 09/09/17 0336 09/10/17 0659  WBC 12.7* 13.0* 13.9* 16.0* 15.8* 21.0*  HGB 9.1* 8.6* 8.5* 9.5* 8.4* 9.0*  HCT 31.0* 28.3* 28.2* 30.0* 28.3* 30.8*  PLT 209 197 175 136* 203 264  MCV 86.8 86.5 86.2 83.3 86.3 87.0  MCH 25.5* 26.3 26.0 26.4 25.6* 25.4*  MCHC 29.4* 30.4 30.1 31.7 29.7* 29.2*  RDW 17.0* 17.3* 16.9* 17.1* 16.9* 17.0*  LYMPHSABS 1.5  --   --   --  1.6 3.4  MONOABS 3.0*  --   --   --  1.9* 2.7*  EOSABS 0.1  --   --   --  0.0 0.2  BASOSABS 0.4*  --   --   --  0.2* 0.8*    Chemistries  Recent Labs  Lab 09/06/17 0457 09/07/17 0328 09/08/17 0501 09/09/17 0336 09/10/17 0659  NA 137 139 142 140 139  K 4.3 4.4 4.7 4.6 5.1  CL 102 102 106 105 102  CO2 27 29 28 27 25   GLUCOSE 103* 107* 100* 99 102*  BUN 42* 43* 45* 46* 45*  CREATININE 1.17 1.27* 1.22 1.22 1.19  CALCIUM 9.3 9.3 9.5 9.6 9.8  AST  --   --   --   --  24  ALT  --   --   --   --  13*  ALKPHOS  --   --   --   --  98  BILITOT  --   --   --   --  0.4   ------------------------------------------------------------------------------------------------------------------ No results for input(s): CHOL, HDL, LDLCALC, TRIG, CHOLHDL, LDLDIRECT in the last 72 hours.  Lab Results  Component Value Date   HGBA1C 5.1 08/18/2017   ------------------------------------------------------------------------------------------------------------------ No results for input(s): TSH, T4TOTAL, T3FREE, THYROIDAB in the last 72 hours.  Invalid input(s):  FREET3 ------------------------------------------------------------------------------------------------------------------ No results for input(s): VITAMINB12, FOLATE, FERRITIN, TIBC, IRON, RETICCTPCT in the last 72 hours.  Coagulation profile No results for input(s): INR, PROTIME in the last 168 hours.  No results for input(s): DDIMER in the last 72 hours.  Cardiac Enzymes No results for input(s): CKMB, TROPONINI, MYOGLOBIN in the last 168 hours.  Invalid input(s): CK ------------------------------------------------------------------------------------------------------------------    Component Value Date/Time   BNP 499.9 (H) 07/16/2017 3559     Roxan Hockey M.D on 09/10/2017 at 6:00 PM  Between 7am to 7pm - Pager - [phone]  After 7pm go to [email or web address] - password [email or web address]  [facility] -  Office  [phone]   Voice Recognition Viviann Spare dictation system was used to create this note, attempts have been made to correct errors. Please contact the author with questions and/or clarifications.

## 2017-09-10 NOTE — Clinical Social Work Note (Signed)
Clinical Social Work Assessment  Patient Details  Name: Juan [name] MRN: [id number] Date of Birth: [birth year]  Date of referral:  09/10/17               Reason for consult:  Facility Placement, Discharge Planning                Permission sought to share information with:    Permission granted to share information::  No  Name::        Agency::     Relationship::     Contact Information:     Housing/Transportation Living arrangements for the past 2 months:  Single Family Home Source of Information:  Patient, Medical Team Patient Interpreter Needed:  None Criminal Activity/Legal Involvement Pertinent to Current Situation/Hospitalization:  No - Comment as needed Significant Relationships:  Adult Children Lives with:  Adult Children Do you feel safe going back to the place where you live?  Yes Need for family participation in patient care:  Yes (Comment)  Care giving concerns:  PT recommending SNF once medically stable for discharge.   Social Worker assessment / plan:  CSW met with patient. No supports at bedside. CSW introduced role and explained that PT recommendations would be discussed. Patient refusing SNF but is willing to consider HHPT. He wants RNCM to wait until tomorrow or Sunday to discuss HHPT with him because he is focused on getting blood today. If patient changes his mind, according to previous CSW documentation, patient is in his copay days and would owe around $160 per day. No further concerns. CSW signing off as social work intervention is no longer needed.  Employment status:  Retired Forensic scientist:  Medicare PT Recommendations:  [place] / Referral to community resources:  [name]  Patient/Family's Response to care:  Patient refusing SNF but willing to consider HHPT. Patient's children supportive and involved in patient's care. Patient appreciated social work intervention.  Patient/Family's Understanding of and  Emotional Response to Diagnosis, Current Treatment, and Prognosis:  Patient has a good understanding of the reason for admission and his need for continued therapy after discharge. Patient appears pleased with hospital care.  Emotional Assessment Appearance:  Appears stated age Attitude/Demeanor/Rapport:  Engaged Affect (typically observed):  Appropriate, Calm, Pleasant Orientation:  Oriented to Self, Oriented to Place, Oriented to  Time, Oriented to Situation Alcohol / Substance use:  Never Used Psych involvement (Current and /or in the community):  No (Comment)  Discharge Needs  Concerns to be addressed:  Care Coordination Readmission within the last 30 days:  Yes Current discharge risk:  Dependent with Mobility Barriers to Discharge:  Continued Medical Work up   Candie Chroman, LCSW 09/10/2017, 1:45 PM

## 2017-09-11 LAB — CBC
HEMATOCRIT: 35 % — AB (ref 39.0–52.0)
HEMOGLOBIN: 10.9 g/dL — AB (ref 13.0–17.0)
MCH: 26.8 pg (ref 26.0–34.0)
MCHC: 31.1 g/dL (ref 30.0–36.0)
MCV: 86.2 fL (ref 78.0–100.0)
Platelets: 310 10*3/uL (ref 150–400)
RBC: 4.06 MIL/uL — ABNORMAL LOW (ref 4.22–5.81)
RDW: 17 % — AB (ref 11.5–15.5)
WBC: 22.5 10*3/uL — ABNORMAL HIGH (ref 4.0–10.5)

## 2017-09-11 LAB — COMPREHENSIVE METABOLIC PANEL
ALK PHOS: 109 U/L (ref 38–126)
ALT: 17 U/L (ref 17–63)
AST: 27 U/L (ref 15–41)
Albumin: 2.8 g/dL — ABNORMAL LOW (ref 3.5–5.0)
Anion gap: 9 (ref 5–15)
BUN: 47 mg/dL — ABNORMAL HIGH (ref 6–20)
CALCIUM: 9.8 mg/dL (ref 8.9–10.3)
CO2: 30 mmol/L (ref 22–32)
Chloride: 100 mmol/L — ABNORMAL LOW (ref 101–111)
Creatinine, Ser: 1.22 mg/dL (ref 0.61–1.24)
GFR, EST NON AFRICAN AMERICAN: 56 mL/min — AB (ref >60)
Glucose, Bld: 99 mg/dL (ref 65–99)
POTASSIUM: 4.6 mmol/L (ref 3.5–5.1)
Sodium: 139 mmol/L (ref 135–145)
TOTAL PROTEIN: 6.4 g/dL — AB (ref 6.5–8.1)
Total Bilirubin: 0.8 mg/dL (ref 0.3–1.2)

## 2017-09-11 MED ORDER — AMLODIPINE BESYLATE 5 MG PO TABS
5.0000 mg | ORAL_TABLET | Freq: Every day | ORAL | Status: DC
  Administered 2017-09-11 – 2017-09-12 (×2): 5 mg via ORAL
  Filled 2017-09-11 (×2): qty 1

## 2017-09-11 NOTE — Progress Notes (Signed)
Patient Demographics:    Juan [name], is a 77 y.o. male, DOB - 10-Aug-1940, OIZ:[id number]  Admit date - 09/05/2017   Admitting Physician Geradine Girt, DO  Outpatient Primary MD for the patient is Colon Branch, MD  LOS - 6   Chief Complaint  Patient presents with  . Recurrent UTI        Subjective:    Juan [name] today has no fevers, no emesis,  No chest pain,   Tolerated transfusion well, elevated BP noted  Assessment  & Plan :    Active Problems:   Poor compliance with advise    Leukocytosis   CKD (chronic kidney disease) stage 3, GFR 30-59 ml/min (HCC)   Chronic diastolic CHF (congestive heart failure), NYHA class 2 (HCC)   UTI (urinary tract infection)  Brief Narrative:  Juan [name] a 77 y.o.malewith medical history significant ofchronic foley due to BPH and bladder diverticulum. Recently discharged on 4/9 after AMS due to ? Infection blood vs urine (culture did not grow anything) and was treated with daptomycin. He comes back after refusing SNF and home health with flank pain as well as subjective fever and chills. In the last 6 months, patient has had 6 admission. At one point in March, patient was deemed not to be able to make the medical decision to refuse PICC. Patient was discharged home with son (who has schizophrenia)  Does have some chronic shortness of breath and wears 2L. Does not follow a low salt diet. Says he can walk several blocks with his son. At home he has a wheelchair.  In the ER, his WBC count was elevated with left shift. U/A was had large LE, and WBCs too numerous to count. ER PA spoke with pharmacy and plan is to start [facility].      Assessment & Plan:   Plan:-  1)Possible UTI- patient was empirically treated with Tressie Ellis for 3 days due to prior urine culture with Pseudomonas, however this time around urine culture without definite growth,  antibiotics has been discontinued, patient is afebrile and his leukocytosis may be from MDS, -has foley catheter due to issues with prostate and urinary diverticulum not allowing him to empty bladder--- urology does not plan to remove foley until patient more ambulatory/rehabed ([place]), -has appointment in May with urology per patient -catheter changed 2-3 weeks ago per patient  2)HTN-elevated BP with sys blood pressure 170 mmhg noted, start amlodipine 5 mg daily, continue hydralazine, for compliance readings once daily regimen we went below for patient, possible discharge home on 09/12/2017 if tolerating medication changes  3)MDS (Anemia/Leukocytosis)-oncology consult from Dr. Martha Clan appreciated, patient received transfusion of 2 units on 09/10/17 for symptomatic anemia, hemoglobin is up to 10.9 posttransfusion, persistent leukocytosis and Anemia most likely  secondary to MDS, can not rule out transformation to leukemia,,   4)Chronic hypoxic respiratory failure -no acute exacerbation at this time , on 2L home O2  5)HFpEF-patient with history of chronic diastolic CHF, appears compensated at this time, continue home dose Lasix, and does not comply with low-salt diet, patient does not like to keep his legs elevated.  Monitor renal function and electrolytes with diuresis  6)OSA:  - CPAP qHS  7)Social/Disposition- Poor compliance with advise and poor home  situation, -during an admission in March, patient was deemed not to be able to make decision regarding PICC Line but was d/c'd home with son who has schizophrenia, -has been seen by palliative care numerous times in the past-- last 01/2017, -PT eval appreciated to recommend skilled nursing facility/SNF placement, patient continues to refuse skilled nursing facility placement, patient states he would think about home health services but is not sure he wants home health services either.possible discharge home on 09/12/2017 if tolerating medication  changes  8)Rt Flank Pain-suspect musculoskeletal rather than UTI related,, -PT eval, -pain control with lidocaine patches seem to be helping   DVT prophylaxis:  scd   Code Status: Full Code   Family Communication: son    Consultants:  Dr. Marin Olp- Oncology  Code Status : full  DVT Prophylaxis  :  SCDs /heparin  Lab Results  Component Value Date   PLT 310 09/11/2017    Inpatient Medications  Scheduled Meds: . aspirin EC  81 mg Oral Daily  . B-complex with vitamin C  1 tablet Oral Daily  . darbepoetin ([name]) injection - NON-DIALYSIS  300 mcg Subcutaneous Once  . ferrous gluconate  324 mg Oral Daily  . furosemide  20 mg Intravenous Once  . furosemide  40 mg Oral q1800  . furosemide  80 mg Oral QAC breakfast  . heparin injection (subcutaneous)  5,000 Units Subcutaneous Q8H  . hydrALAZINE  25 mg Oral TID  . lidocaine  2 patch Transdermal Q24H  . tamsulosin  0.4 mg Oral QPC supper   Continuous Infusions:  PRN Meds:.acetaminophen, ondansetron **OR** ondansetron (ZOFRAN) IV, sodium chloride    Anti-infectives (From admission, onward)   Start     Dose/Rate Route Frequency Ordered Stop   09/05/17 2200  cefTAZidime (FORTAZ) 1 g in sodium chloride 0.9 % 100 mL IVPB  Status:  Discontinued     1 g 200 mL/hr over 30 Minutes Intravenous Every 8 hours 09/05/17 1412 09/08/17 1051   09/05/17 1430  cefTAZidime (FORTAZ) 1 g in sodium chloride 0.9 % 100 mL IVPB     1 g 200 mL/hr over 30 Minutes Intravenous  Once 09/05/17 1357 09/05/17 1700        Objective:   Vitals:   09/11/17 0445 09/11/17 0645 09/11/17 1633 09/11/17 1636  BP:  (!) 170/88 (!) 170/79 (!) 170/79  Pulse: 85 85 71 71  Resp:  18  16  Temp:  99 F (37.2 C) 97.8 F (36.6 C) 97.8 F (36.6 C)  TempSrc:  Oral Oral   SpO2: (!) 89% 98% 97% 97%  Weight:      Height:        Wt Readings from Last 3 Encounters:  09/09/17 85.8 kg (189 lb 2.5 oz)  09/03/17 87.5 kg (193 lb)  08/23/17 81.3 kg (179 lb  3.2 oz)     Intake/Output Summary (Last 24 hours) at 09/11/2017 1713 Last data filed at 09/11/2017 0645 Gross per 24 hour  Intake 1120 ml  Output 2475 ml  Net -1355 ml     Physical Exam  Gen:- Awake Alert,  In no apparent distress  HEENT:- [place].AT, No sclera icterus Nose- [place] 2 L/min Neck-Supple Neck,No JVD,.  Lungs-diminished in bases, no wheezing  CV- S1, S2 normal Abd-  +ve B.Sounds, Abd Soft, No tenderness,    Extremity/Skin:-Lower extremity edema with  chronic venous stasis  psych-affect is appropriate, oriented x3 Neuro-no new focal deficits, no tremors GU-Foley catheter with clear urine  Data Review:  Micro Results Recent Results (from the past 240 hour(s))  Urine culture     Status: Abnormal   Collection Time: 09/05/17  7:32 AM  Result Value Ref Range Status   Specimen Description URINE, RANDOM  Final   Special Requests   Final    NONE Performed at [facility], [address], [place], [place] [address]    Culture MULTIPLE SPECIES PRESENT, SUGGEST RECOLLECTION (A)  Final   Report Status 09/06/2017 FINAL  Final    Radiology Reports Dg Lumbar Spine 2-3 Views  Result Date: 09/06/2017 CLINICAL DATA:  Localized low back pain for 2 days. EXAM: LUMBAR SPINE - 2-3 VIEW COMPARISON:  Body CT 09/05/2017 FINDINGS: Sclerotic appearance of the axial skeleton. Straightening of the lumbosacral lordosis. No evidence of compression deformity to suggest acute fracture. IMPRESSION: Grossly abnormal sclerotic appearance of the visualized axial skeleton, without evidence of acute fracture. Electronically Signed   By: Fidela Salisbury M.D.   On: 09/06/2017 21:41   Ct Head Wo Contrast  Result Date: 08/18/2017 CLINICAL DATA:  Altered mental status EXAM: CT HEAD WITHOUT CONTRAST TECHNIQUE: Contiguous axial images were obtained from the base of the skull through the vertex without intravenous contrast. COMPARISON:  None. FINDINGS: Brain: No evidence of acute infarction, hemorrhage,  extra-axial collection, ventriculomegaly, or mass effect. Generalized cerebral atrophy. Periventricular white matter low attenuation likely secondary to microangiopathy. Vascular: No hyperdense vessel. Skull: Negative for fracture or focal lesion. Sinuses/Orbits: Visualized portions of the orbits are unremarkable. Visualized portions of the paranasal sinuses and mastoid air cells are unremarkable. Other: None. IMPRESSION: No acute intracranial pathology. Electronically Signed   By: Kathreen Devoid   On: 08/18/2017 11:48   Dg Chest Port 1 View  Result Date: 09/05/2017 CLINICAL DATA:  Right flank and bladder pain today. History of myelofibrosis. EXAM: PORTABLE CHEST 1 VIEW COMPARISON:  PA and scratch the single view of the chest 08/18/2017. PA and lateral chest 07/16/2016 and 07/06/2017. FINDINGS: The lungs are clear. Heart size is enlarged. No pneumothorax or pleural effusion. Diffuse sclerosis in all imaged bones is unchanged. IMPRESSION: No acute disease. Diffuse sclerosis in all imaged bones is compatible with the patient's history myelofibrosis. Electronically Signed   By: Inge Rise M.D.   On: 09/05/2017 14:23   Dg Chest Portable 1 View  Result Date: 08/18/2017 CLINICAL DATA:  Altered mental status EXAM: PORTABLE CHEST 1 VIEW COMPARISON:  07/16/2017, 07/06/2017, 06/12/2017, 01/23/2017 FINDINGS: Continued bilateral left greater than right ground-glass opacity. Mild cardiomegaly. Aortic atherosclerosis. No pneumothorax. Abnormal osseous structures with diffuse sclerosis and multiple small lucent lesions. IMPRESSION: 1. Similar appearance of mild interstitial and ground-glass opacity within the bilateral lungs. Stable enlarged cardiomediastinal silhouette. 2. No significant interval change compared to prior radiograph. Electronically Signed   By: Donavan Foil M.D.   On: 08/18/2017 03:11   Ct Renal Stone Study  Result Date: 09/05/2017 CLINICAL DATA:  77 year old male with right flank and bladder pain.  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 EXAM: CT ABDOMEN AND PELVIS WITHOUT CONTRAST TECHNIQUE: Multidetector CT imaging of the abdomen and pelvis was performed following the standard protocol without IV contrast. COMPARISON:  Recent prior CT abdomen/pelvis 08/18/2017; contrast-enhanced CT scan of the abdomen and pelvis 06/07/2017 FINDINGS: Lower chest: Mild dependent atelectasis bilaterally. Otherwise, no acute abnormality within the visualized lower chest. Hepatobiliary: Normal hepatic contour and morphology. No discrete hepatic lesions. Normal appearance of the gallbladder. No intra or extrahepatic biliary ductal dilatation. Pancreas: Unremarkable. No pancreatic ductal dilatation  or surrounding inflammatory changes. Spleen: Normal in size without focal abnormality. Adrenals/Urinary Tract: Normal adrenal glands. Stable simple cyst arising from the lower pole of the right kidney. No evidence of hydronephrosis or nephrolithiasis. The ureters are unremarkable. The bladder is diffusely thick walled. A Foley catheter is in place. Multiple bladder diverticula. The largest measures 4.2 x 4.0 cm. Stomach/Bowel: No focal bowel wall thickening or evidence of obstruction. Colonic diverticulosis without evidence of active diverticulitis. Vascular/Lymphatic: Limited evaluation in the absence of intravenous contrast. Atherosclerotic calcifications present along the abdominal aorta. No aneurysm. No suspicious lymphadenopathy. Reproductive: Prostate is unremarkable. Other: No abdominal wall hernia or abnormality. No abdominopelvic ascites. Musculoskeletal: Diffusely abnormal osseous structures with heterogeneous sclerosis and innumerable small lucent lesions throughout all visualized bones. These findings are essentially unchanged over the past several studies. IMPRESSION: 1. No acute abnormality within the abdomen or pelvis. 2. Foley catheter well positioned in the bladder. The bladder is diffusely  thick walled and contains multiple diverticula. Wall thickening could reflect chronic trabeculation, or acute or chronic cystitis. If the diverticula are incompletely emptying, this could provide a source for urinary stasis and bacterial overgrowth. 3. Colonic diverticular disease without CT evidence of active inflammation. 4. Diffusely abnormal osseous structures with heterogeneous sclerosis and innumerable small lucent lesions. 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 Imaging findings are unchanged across multiple prior studies. Electronically Signed   By: Jacqulynn Cadet M.D.   On: 09/05/2017 09:17   Ct Renal Stone Study  Result Date: 08/18/2017 CLINICAL DATA:  Bladder pain. Discharged 2 days ago for urinary tract infection. History of diabetes and pyelonephritis. EXAM: CT ABDOMEN AND PELVIS WITHOUT CONTRAST TECHNIQUE: Multidetector CT imaging of the abdomen and pelvis was performed following the standard protocol without IV contrast. COMPARISON:  08/03/2017 FINDINGS: Lower chest: Motion artifact limits examination. Atelectasis in the lung bases. Mild cardiac enlargement. Coronary artery calcifications. Hepatobiliary: No focal liver abnormality is seen. Tiny gallstones are suggested. No gallbladder wall thickening, or biliary dilatation. Pancreas: Unremarkable. No pancreatic ductal dilatation or surrounding inflammatory changes. Spleen: Spleen is enlarged. Adrenals/Urinary Tract: No adrenal gland nodules. Subcentimeter hemorrhagic cyst in the upper pole right kidney. No hydronephrosis. No renal stones identified. Bladder wall is diffusely thickened. This may indicate cystitis. A Foley catheter is in place. Gas in the bladder could arise from catheter insertion. Large right posterior bladder wall diverticulum. Stomach/Bowel: Stomach, small bowel, and colon are decompressed. Diverticulosis of the sigmoid colon without evidence of diverticulitis. Appendix is normal. Vascular/Lymphatic: Aortic  atherosclerosis. No enlarged abdominal or pelvic lymph nodes. Reproductive: Prostate is unremarkable. Other: Diffuse edema in the subcutaneous fat and mesentery. No free or loculated pelvic or abdominal fluid collections. No free air. Soft tissue nodule in the low pelvic subcutaneous fat anteriorly probably represents a sebaceous cyst. Groin lymph nodes are not pathologically enlarged, likely reactive. Musculoskeletal: Diffuse heterogeneous bone sclerosis consistent with known myeloproliferative disorder. IMPRESSION: 1. Diffuse bladder wall thickening likely indicating cystitis. Large bladder diverticula. 2. No renal or ureteral stone or obstruction. 3. Aortic atherosclerosis. 4. Enlarged spleen. 5. Diffuse edema throughout the subcutaneous and mesenteric fat. No loculated fluid collections. 6. Diffuse heterogeneous bone sclerosis consistent with known myeloproliferative disorder. Electronically Signed   By: Lucienne Capers M.D.   On: 08/18/2017 05:15     CBC Recent Labs  Lab 09/05/17 0732  09/07/17 0328 09/08/17 0501 09/09/17 0336 09/10/17 0659 09/11/17 0801  WBC 12.7*   < > 13.9* 16.0* 15.8* 21.0* 22.5*  HGB 9.1*   < >  8.5* 9.5* 8.4* 9.0* 10.9*  HCT 31.0*   < > 28.2* 30.0* 28.3* 30.8* 35.0*  PLT 209   < > 175 136* 203 264 310  MCV 86.8   < > 86.2 83.3 86.3 87.0 86.2  MCH 25.5*   < > 26.0 26.4 25.6* 25.4* 26.8  MCHC 29.4*   < > 30.1 31.7 29.7* 29.2* 31.1  RDW 17.0*   < > 16.9* 17.1* 16.9* 17.0* 17.0*  LYMPHSABS 1.5  --   --   --  1.6 3.4  --   MONOABS 3.0*  --   --   --  1.9* 2.7*  --   EOSABS 0.1  --   --   --  0.0 0.2  --   BASOSABS 0.4*  --   --   --  0.2* 0.8*  --    < > = values in this interval not displayed.    Chemistries  Recent Labs  Lab 09/07/17 0328 09/08/17 0501 09/09/17 0336 09/10/17 0659 09/11/17 0801  NA 139 142 140 139 139  K 4.4 4.7 4.6 5.1 4.6  CL 102 106 105 102 100*  CO2 29 28 27 25 30   GLUCOSE 107* 100* 99 102* 99  BUN 43* 45* 46* 45* 47*  CREATININE  1.27* 1.22 1.22 1.19 1.22  CALCIUM 9.3 9.5 9.6 9.8 9.8  AST  --   --   --  24 27  ALT  --   --   --  13* 17  ALKPHOS  --   --   --  98 109  BILITOT  --   --   --  0.4 0.8   ------------------------------------------------------------------------------------------------------------------ No results for input(s): CHOL, HDL, LDLCALC, TRIG, CHOLHDL, LDLDIRECT in the last 72 hours.  Lab Results  Component Value Date   HGBA1C 5.1 08/18/2017   ------------------------------------------------------------------------------------------------------------------ No results for input(s): TSH, T4TOTAL, T3FREE, THYROIDAB in the last 72 hours.  Invalid input(s): FREET3 ------------------------------------------------------------------------------------------------------------------ No results for input(s): VITAMINB12, FOLATE, FERRITIN, TIBC, IRON, RETICCTPCT in the last 72 hours.  Coagulation profile No results for input(s): INR, PROTIME in the last 168 hours.  No results for input(s): DDIMER in the last 72 hours.  Cardiac Enzymes No results for input(s): CKMB, TROPONINI, MYOGLOBIN in the last 168 hours.  Invalid input(s): CK ------------------------------------------------------------------------------------------------------------------    Component Value Date/Time   BNP 499.9 (H) 07/16/2017 0093    Roxan Hockey M.D on 09/11/2017 at 5:13 PM  Between 7am to 7pm - Pager - [phone]  After 7pm go to [email or web address] - password [email or web address]  [facility] -  Office  [phone]   Voice Recognition Viviann Spare dictation system was used to create this note, attempts have been made to correct errors. Please contact the author with questions and/or clarifications.

## 2017-09-12 LAB — TYPE AND SCREEN
ABO/RH(D): O POS
ANTIBODY SCREEN: NEGATIVE
UNIT DIVISION: 0
Unit division: 0

## 2017-09-12 LAB — CBC
HCT: 37.9 % — ABNORMAL LOW (ref 39.0–52.0)
Hemoglobin: 11.5 g/dL — ABNORMAL LOW (ref 13.0–17.0)
MCH: 26.4 pg (ref 26.0–34.0)
MCHC: 30.3 g/dL (ref 30.0–36.0)
MCV: 86.9 fL (ref 78.0–100.0)
PLATELETS: 392 10*3/uL (ref 150–400)
RBC: 4.36 MIL/uL (ref 4.22–5.81)
RDW: 17.2 % — AB (ref 11.5–15.5)
WBC: 23.9 10*3/uL — AB (ref 4.0–10.5)

## 2017-09-12 LAB — BPAM RBC
BLOOD PRODUCT EXPIRATION DATE: 201905152359
Blood Product Expiration Date: 201905172359
ISSUE DATE / TIME: 201904270004
ISSUE DATE / TIME: 201904270343
UNIT TYPE AND RH: 5100
UNIT TYPE AND RH: 5100

## 2017-09-12 MED ORDER — CLOTRIMAZOLE 1 % EX CREA: TOPICAL_CREAM | CUTANEOUS | 0 refills | Status: AC

## 2017-09-12 MED ORDER — FUROSEMIDE 40 MG PO TABS: ORAL_TABLET | ORAL | 2 refills | Status: DC

## 2017-09-12 MED ORDER — ONDANSETRON HCL 4 MG PO TABS: 4.0000 mg | ORAL_TABLET | Freq: Four times a day (QID) | ORAL | 0 refills | Status: AC | PRN

## 2017-09-12 MED ORDER — AMLODIPINE BESYLATE 5 MG PO TABS: 5.0000 mg | ORAL_TABLET | Freq: Every day | ORAL | 2 refills | Status: DC

## 2017-09-12 MED ORDER — HYDRALAZINE HCL 50 MG PO TABS: 50.0000 mg | ORAL_TABLET | Freq: Three times a day (TID) | ORAL | 3 refills | Status: DC

## 2017-09-12 NOTE — Progress Notes (Signed)
Patient discharged to home, AVS reviewed, prescriptions provided, IV removed, patient left floor with transport

## 2017-09-12 NOTE — Discharge Instructions (Signed)
1)Follow-up with Dr. Orlie Dakin scheduled in May for Urology evaluation and Possible Removal of Foley catheter 2)Needs improved medication compliance and Cardiopulmonary Assessment--Please send home health registered nurse at [facility] once a week to evaluate patient at home 3)Follow-up with Dr. Marin Olp your oncologist in 1 to 2 weeks for recheck 4)Low-salt diet strongly advised 5)You need repeat complete blood count/CBC and repeat BMP/basic metabolic profile in 1 to 2 weeks 6) please take your medications as prescribed, please note that there are several medication changes from your previous regimen

## 2017-09-12 NOTE — Care Management Note (Signed)
Case Management Note  Patient Details  Name: Juan [name] MRN: [id number] Date of Birth: 1940-10-28  Subjective/Objective:                    Action/Plan: Pt discharging home with orders for [facility] RN. Pt is refusing. When Pt does accept [facility] services in the hospital he will not let them in when they attempt to make a visit.  Pt is oxygen dependent. He uses [facility] for his home oxygen and supplies. Pt requesting [facility] home. CM called and arranged this transport. Form on the front of the chart and bedside RN updated.   Expected Discharge Date:  09/12/17               Expected Discharge Plan:  [place]  In-House Referral:  [facility]  Discharge planning Services  CM Consult  Post Acute Care Choice:  Home Health Choice offered to:  Patient  DME Arranged:    DME Agency:     HH Arranged:  Patient Refused [place] Agency:     Status of Service:  Completed, signed off  If discussed at [employer], dates discussed:    Additional Comments:  Pollie Friar, RN 09/12/2017, 2:51 PM

## 2017-09-12 NOTE — Discharge Summary (Signed)
Juan [name], is a 77 y.o. male  DOB Sep 21, 1940  MRN [id number].  Admission date:  09/05/2017  Admitting Physician  Geradine Girt, DO  Discharge Date:  09/12/2017   Primary MD  Colon Branch, MD  Recommendations for primary care physician for things to follow:   1)Follow-up with Dr. Orlie Dakin scheduled in May for Urology evaluation and Possible Removal of Foley catheter 2)Needs improved medication compliance and Cardiopulmonary Assessment--Please send home health registered nurse at [facility] once a week to evaluate patient at home 3)Follow-up with Dr. Marin Olp your oncologist in 1 to 2 weeks for recheck 4)Low-salt diet strongly advised 5)You need repeat complete blood count/CBC and repeat BMP/basic metabolic profile in 1 to 2 weeks 6) please take your medications as prescribed, please note that there are several medication changes from your previous regimen  Admission Diagnosis  Confusion [R41.0] SOB (shortness of breath) [R06.02] Urinary tract infection without hematuria, site unspecified [N39.0] Acute right-sided low back pain without sciatica [M54.5]  Discharge Diagnosis  Confusion [R41.0] SOB (shortness of breath) [R06.02] Urinary tract infection without hematuria, site unspecified [N39.0] Acute right-sided low back pain without sciatica [M54.5]    Active Problems:   Poor compliance with advise    Leukocytosis   CKD (chronic kidney disease) stage 3, GFR 30-59 ml/min (HCC)   Chronic diastolic CHF (congestive heart failure), NYHA class 2 ([place])   UTI (urinary tract infection)      Past Medical History:  Diagnosis Date  . Allergic rhinitis   . Anemia   . Ascending aortic aneurysm ([place]) 03/2014   4.3cm on CT scan  . CAD (coronary artery disease)    dx elsewheer in past, no documentation. Non-ischemic myoview 2007  . Chronic diastolic CHF (congestive heart failure), NYHA class 2 (HCC)    Normal EF  w/ grade 1 dd by echo 12/2015  . Chronic respiratory failure with hypoxia/2L at home    "2L; all the time" (05/03/2017)  . CKD (chronic kidney disease), stage III ([place])   . Edema    R>L leg, u/s 5-12 neg for DVT  . Hemorrhoid   . History of hydronephrosis   . History of Splenic infarct   . History of thrombocytosis   . Hypertension   . Iron deficiency anemia due to chronic blood loss 03/31/2017  . 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 Migraine    "once/wk at least" (07/11/2013)  . Moderate to severe pulmonary hypertension ([place])   . Myeloproliferative disease ([place])   . Pyelonephritis 04/29/2017  . Recurrent Pseudomonas urinary tract infection   . Shortness of breath   . Sinus congestion   . Sleep apnea, obstructive    at some point used CPAP, was d/c  years ago  . Type II diabetes mellitus ([place])   . UTI (urinary tract infection) 06/2017  . UTI (urinary tract infection) 07/2017    Past Surgical History:  Procedure Laterality Date  . FLEXIBLE SIGMOIDOSCOPY N/A 06/08/2017   Procedure: FLEXIBLE SIGMOIDOSCOPY;  Surgeon: Carol Ada, MD;  Location: [facility];  Service: Endoscopy;  Laterality: N/A;  . IR FLUORO GUIDE CV LINE RIGHT  05/07/2017  . IR FLUORO RM 30-60 MIN  05/07/2017  . IR US GUIDE VASC ACCESS RIGHT  05/07/2017  . TOE SURGERY Right    "tried to straighten out big toe" (07/11/2013)     HPI  from the history and physical done on the day of admission:    Chief Complaint: fever/chills/flank pain  HPI: Juan [name] is a 77 y.o. 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  Recently discharged on 4/9 after AMS due to ? Infection blood vs urine (culture did not grow anything) and was treated with daptomycin.  He comes back after refusing SNF and home health with flank pain as well as subjective fever and chills.   In the last 6 months, patient has had 6 admission.  At one point in March, patient was deemed not to be able to make the  medical decision to refuse PICC.  Patient was discharged home with son (who has schizophrenia)  Does have some chronic shortness of breath and wears 2L.  Does not follow a low salt diet.  Says he can walk several blocks with his son.  At home he has a wheelchair.  In the ER, his WBC count was elevated with left shift.  U/A was had large LE, and WBCs too numerous to count.  ER PA spoke with pharmacy and plan is to start [facility] Course:     Plan:-  1)Possible UTI- patient was empirically treated with [facility] for 3 days due to prior urine culture with Pseudomonas, however this time around urine culture without definite growth, antibiotics has been discontinued, patient is afebrile and his leukocytosis is most likely from MDS, rather than fran infection. Pt has foley catheter due to outlet/outflow obstruction secondary to prostate and urinary diverticulum not allowing him to empty bladder--- urology does not plan to remove foley until patient more ambulatory/rehabed ([place]), -has appointment in May with urology per patient, -catheter changed 2-3 weeks ago per patient, c/n flomax  2)HTN-improved BP with adjustment of medications, okay to discharge on amlodipine 5 mg daily, increase hydralazine to 50 mg 3 times daily   3)MDS (Anemia/Leukocytosis)-oncology consult from Dr. Martha Clan appreciated, patient received transfusion of 2 units on 09/10/17 for symptomatic anemia, hemoglobin is now > 11 posttransfusion, persistent leukocytosis and Anemia most likely  secondary to MDS, can not rule out transformation to leukemia,,   4)Chronic hypoxic respiratory failure -no acute exacerbation at this time , on 2L home O2 (oxygen requirement is at patient's usual baseline)  5)HFpEF-patient with history of chronic diastolic CHF, appears compensated at this time, continue home dose Lasix (80 mg qam and 40 mg q pm), compliant with low-salt diet advised, she did admit that prior to admission he was  noncompliant with low-salt diet,  patient does not like to keep his legs elevated.     6)OSA:  - CPAP qHS  7)Social/Disposition- Poor compliance with advise and poor home situation, -during admission in March, patient was deemed not to be able to make decision regarding PICC Line but was d/c'd homewith son who has schizophrenia, -has been seen by palliative care numerous times in the past-- last 01/2017, -PT eval appreciated to recommend skilled nursing facility/SNF placement, patient continues to refuse skilled nursing facility placement, discharge home with home health services   8)Rt Flank Pain-suspect musculoskeletal rather than UTI related,, -PT eval, -pain control with  lidocaine patches seem to be helping  9)Tinea Pedis- Lt foot , Lotrimin as prescribed  Code Status: Full Code   Family Communication: son   Consultants: Dr. Marin Olp- Oncology   Discharge Condition: stable  Follow UP  [place], MD Follow up.   Specialty:  Internal Medicine Contact information: [address] [place] [address] [phone]        Ardis Hughs, MD. Go on 08/29/2018.   Specialty:  Urology Why:  Prior to admission patient had appointment already scheduled for 9 am Contact information: [address] [place] [address] [phone]           Diet and Activity recommendation:  As advised  Discharge Instructions     Discharge Instructions    ([place]) Call MD:  Anytime you have any of the following symptoms: 1) 3 pound weight gain in 24 hours or 5 pounds in 1 week 2) shortness of breath, with or without a dry hacking cough 3) swelling in the hands, feet or stomach 4) if you have to sleep on extra pillows at night in order to breathe.   Complete by:  As directed    Call MD for:  persistant dizziness or light-headedness   Complete by:  As directed    Call MD for:  persistant nausea and vomiting    Complete by:  As directed    Call MD for:  severe uncontrolled pain   Complete by:  As directed    Call MD for:  temperature >100.4   Complete by:  As directed    Diet - low sodium heart healthy   Complete by:  As directed    Discharge instructions   Complete by:  As directed    1)Follow-up with Dr. Orlie Dakin scheduled in May for Urology evaluation and Possible Removal of Foley catheter 2)Needs improved medication compliance and Cardiopulmonary Assessment--Please send home health registered nurse at [facility] once a week to evaluate patient at home 3)Follow-up with Dr. Marin Olp your oncologist in 1 to 2 weeks for recheck 4)Low-salt diet strongly advised 5)You need repeat complete blood count/CBC and repeat BMP/basic metabolic profile in 1 to 2 weeks 6) please take your medications as prescribed, please note that there are several medication changes from your previous regimen   Increase activity slowly   Complete by:  As directed        Discharge Medications     Allergies as of 09/12/2017   No Known Allergies     Medication List    TAKE these medications   acetaminophen 325 MG tablet Commonly known as:  TYLENOL Take 2 tablets (650 mg total) by mouth every 6 (six) hours as needed for mild pain (or Fever >/= 101).   amLODipine 5 MG tablet Commonly known as:  NORVASC Take 1 tablet (5 mg total) by mouth daily. For BP Start taking on:  09/13/2017   aspirin EC 81 MG tablet Take 81 mg by mouth daily.   b complex vitamins tablet Take 1 tablet by mouth daily.   benzonatate 100 MG capsule Commonly known as:  TESSALON Take 1 capsule (100 mg total) by mouth 2 (two) times daily as needed for cough.   clotrimazole 1 % cream Commonly known as:  LOTRIMIN AF Apply to Lt foot (between) Toes bid for athletes foot   ferrous gluconate 240 (27 FE) MG tablet Commonly known as:  FERGON Take 240 mg by mouth daily.  furosemide 40 MG tablet Commonly known as:  LASIX Take 80mg  (2tabs) in  morning and 40mg  (1tab) in evening What changed:    how much to take  how to take this  when to take this  additional instructions   hydrALAZINE 50 MG tablet Commonly known as:  APRESOLINE Take 1 tablet (50 mg total) by mouth 3 (three) times daily. What changed:    medication strength  how much to take   ondansetron 4 MG tablet Commonly known as:  ZOFRAN Take 1 tablet (4 mg total) by mouth every 6 (six) hours as needed for nausea or vomiting.   OXYGEN Inhale 2 L into the lungs continuous.   tamsulosin 0.4 MG Caps capsule Commonly known as:  FLOMAX Take 1 capsule (0.4 mg total) by mouth daily after supper.       Major procedures and Radiology Reports - PLEASE review detailed and final reports for all details, in brief -   Dg Lumbar Spine 2-3 Views  Result Date: 09/06/2017 CLINICAL DATA:  Localized low back pain for 2 days. EXAM: LUMBAR SPINE - 2-3 VIEW COMPARISON:  Body CT 09/05/2017 FINDINGS: Sclerotic appearance of the axial skeleton. Straightening of the lumbosacral lordosis. No evidence of compression deformity to suggest acute fracture. IMPRESSION: Grossly abnormal sclerotic appearance of the visualized axial skeleton, without evidence of acute fracture. Electronically Signed   By: Fidela Salisbury M.D.   On: 09/06/2017 21:41   Ct Head Wo Contrast  Result Date: 08/18/2017 CLINICAL DATA:  Altered mental status EXAM: CT HEAD WITHOUT CONTRAST TECHNIQUE: Contiguous axial images were obtained from the base of the skull through the vertex without intravenous contrast. COMPARISON:  None. FINDINGS: Brain: No evidence of acute infarction, hemorrhage, extra-axial collection, ventriculomegaly, or mass effect. Generalized cerebral atrophy. Periventricular white matter low attenuation likely secondary to microangiopathy. Vascular: No hyperdense vessel. Skull: Negative for fracture or focal lesion. Sinuses/Orbits: Visualized portions of the orbits are unremarkable. Visualized  portions of the paranasal sinuses and mastoid air cells are unremarkable. Other: None. IMPRESSION: No acute intracranial pathology. Electronically Signed   By: Kathreen Devoid   On: 08/18/2017 11:48   Dg Chest Port 1 View  Result Date: 09/05/2017 CLINICAL DATA:  Right flank and bladder pain today. History of myelofibrosis. EXAM: PORTABLE CHEST 1 VIEW COMPARISON:  PA and scratch the single view of the chest 08/18/2017. PA and lateral chest 07/16/2016 and 07/06/2017. FINDINGS: The lungs are clear. Heart size is enlarged. No pneumothorax or pleural effusion. Diffuse sclerosis in all imaged bones is unchanged. IMPRESSION: No acute disease. Diffuse sclerosis in all imaged bones is compatible with the patient's history myelofibrosis. Electronically Signed   By: Inge Rise M.D.   On: 09/05/2017 14:23   Dg Chest Portable 1 View  Result Date: 08/18/2017 CLINICAL DATA:  Altered mental status EXAM: PORTABLE CHEST 1 VIEW COMPARISON:  07/16/2017, 07/06/2017, 06/12/2017, 01/23/2017 FINDINGS: Continued bilateral left greater than right ground-glass opacity. Mild cardiomegaly. Aortic atherosclerosis. No pneumothorax. Abnormal osseous structures with diffuse sclerosis and multiple small lucent lesions. IMPRESSION: 1. Similar appearance of mild interstitial and ground-glass opacity within the bilateral lungs. Stable enlarged cardiomediastinal silhouette. 2. No significant interval change compared to prior radiograph. Electronically Signed   By: Donavan Foil M.D.   On: 08/18/2017 03:11   Ct Renal Stone Study  Result Date: 09/05/2017 CLINICAL DATA:  77 year old male with right flank and bladder pain. 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 EXAM: CT ABDOMEN AND PELVIS WITHOUT CONTRAST TECHNIQUE: Multidetector CT  imaging of the abdomen and pelvis was performed following the standard protocol without IV contrast. COMPARISON:  Recent prior CT abdomen/pelvis 08/18/2017; contrast-enhanced CT scan  of the abdomen and pelvis 06/07/2017 FINDINGS: Lower chest: Mild dependent atelectasis bilaterally. Otherwise, no acute abnormality within the visualized lower chest. Hepatobiliary: Normal hepatic contour and morphology. No discrete hepatic lesions. Normal appearance of the gallbladder. No intra or extrahepatic biliary ductal dilatation. Pancreas: Unremarkable. No pancreatic ductal dilatation or surrounding inflammatory changes. Spleen: Normal in size without focal abnormality. Adrenals/Urinary Tract: Normal adrenal glands. Stable simple cyst arising from the lower pole of the right kidney. No evidence of hydronephrosis or nephrolithiasis. The ureters are unremarkable. The bladder is diffusely thick walled. A Foley catheter is in place. Multiple bladder diverticula. The largest measures 4.2 x 4.0 cm. Stomach/Bowel: No focal bowel wall thickening or evidence of obstruction. Colonic diverticulosis without evidence of active diverticulitis. Vascular/Lymphatic: Limited evaluation in the absence of intravenous contrast. Atherosclerotic calcifications present along the abdominal aorta. No aneurysm. No suspicious lymphadenopathy. Reproductive: Prostate is unremarkable. Other: No abdominal wall hernia or abnormality. No abdominopelvic ascites. Musculoskeletal: Diffusely abnormal osseous structures with heterogeneous sclerosis and innumerable small lucent lesions throughout all visualized bones. These findings are essentially unchanged over the past several studies. IMPRESSION: 1. No acute abnormality within the abdomen or pelvis. 2. Foley catheter well positioned in the bladder. The bladder is diffusely thick walled and contains multiple diverticula. Wall thickening could reflect chronic trabeculation, or acute or chronic cystitis. If the diverticula are incompletely emptying, this could provide a source for urinary stasis and bacterial overgrowth. 3. Colonic diverticular disease without CT evidence of active inflammation.  4. Diffusely abnormal osseous structures with heterogeneous sclerosis and innumerable small lucent lesions. 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 Imaging findings are unchanged across multiple prior studies. Electronically Signed   By: Jacqulynn Cadet M.D.   On: 09/05/2017 09:17   Ct Renal Stone Study  Result Date: 08/18/2017 CLINICAL DATA:  Bladder pain. Discharged 2 days ago for urinary tract infection. History of diabetes and pyelonephritis. EXAM: CT ABDOMEN AND PELVIS WITHOUT CONTRAST TECHNIQUE: Multidetector CT imaging of the abdomen and pelvis was performed following the standard protocol without IV contrast. COMPARISON:  08/03/2017 FINDINGS: Lower chest: Motion artifact limits examination. Atelectasis in the lung bases. Mild cardiac enlargement. Coronary artery calcifications. Hepatobiliary: No focal liver abnormality is seen. Tiny gallstones are suggested. No gallbladder wall thickening, or biliary dilatation. Pancreas: Unremarkable. No pancreatic ductal dilatation or surrounding inflammatory changes. Spleen: Spleen is enlarged. Adrenals/Urinary Tract: No adrenal gland nodules. Subcentimeter hemorrhagic cyst in the upper pole right kidney. No hydronephrosis. No renal stones identified. Bladder wall is diffusely thickened. This may indicate cystitis. A Foley catheter is in place. Gas in the bladder could arise from catheter insertion. Large right posterior bladder wall diverticulum. Stomach/Bowel: Stomach, small bowel, and colon are decompressed. Diverticulosis of the sigmoid colon without evidence of diverticulitis. Appendix is normal. Vascular/Lymphatic: Aortic atherosclerosis. No enlarged abdominal or pelvic lymph nodes. Reproductive: Prostate is unremarkable. Other: Diffuse edema in the subcutaneous fat and mesentery. No free or loculated pelvic or abdominal fluid collections. No free air. Soft tissue nodule in the low pelvic subcutaneous fat anteriorly probably represents a  sebaceous cyst. Groin lymph nodes are not pathologically enlarged, likely reactive. Musculoskeletal: Diffuse heterogeneous bone sclerosis consistent with known myeloproliferative disorder. IMPRESSION: 1. Diffuse bladder wall thickening likely indicating cystitis. Large bladder diverticula. 2. No renal or ureteral stone or obstruction. 3. Aortic atherosclerosis. 4. Enlarged spleen. 5. Diffuse  edema throughout the subcutaneous and mesenteric fat. No loculated fluid collections. 6. Diffuse heterogeneous bone sclerosis consistent with known myeloproliferative disorder. Electronically Signed   By: Lucienne Capers M.D.   On: 08/18/2017 05:15    Micro Results   Recent Results (from the past 240 hour(s))  Urine culture     Status: Abnormal   Collection Time: 09/05/17  7:32 AM  Result Value Ref Range Status   Specimen Description URINE, RANDOM  Final   Special Requests   Final    NONE Performed at [facility], [address], [place], [place] [address]    Culture MULTIPLE SPECIES PRESENT, SUGGEST RECOLLECTION (A)  Final   Report Status 09/06/2017 FINAL  Final       Today   Subjective    [name] today has no complaints, Lt foot concerns addressed, No fever  Or chills           Patient has been seen and examined prior to discharge   Objective   Blood pressure (!) 160/102, pulse 72, temperature 97.8 F (36.6 C), temperature source Oral, resp. rate 16, height 6\' 3"  (1.905 m), weight 85.8 kg (189 lb 2.5 oz), SpO2 100 %.   Intake/Output Summary (Last 24 hours) at 09/12/2017 1445 Last data filed at 09/12/2017 1000 Gross per 24 hour  Intake 600 ml  Output 2550 ml  Net -1950 ml    Exam Gen:- Awake Alert,  In no apparent distress  HEENT:- [place].AT, No sclera icterus Nose- [place] 2 L/min Neck-Supple Neck,No JVD,.  Lungs-  CTAB , good air movement CV- S1, S2 normal Abd-  +ve B.Sounds, Abd Soft, No tenderness,    Extremity/Skin:-Left foot with tinea pedis type findings in the  interdigital spaces psych-affect is appropriate, oriented x3 Neuro-no new focal deficits, no tremors   Data Review   CBC w Diff:  Lab Results  Component Value Date   WBC 23.9 (H) 09/12/2017   HGB 11.5 (L) 09/12/2017   HGB 10.7 (L) 03/30/2017   HGB 9.8 (L) 11/16/2013   HCT 37.9 (L) 09/12/2017   HCT 34.1 (L) 03/30/2017   HCT 30.7 (L) 11/16/2013   PLT 392 09/12/2017   PLT 287 Large platelets present 03/30/2017   PLT 323 11/16/2013   LYMPHOPCT 16 09/10/2017   LYMPHOPCT 14.0 03/30/2017   BANDSPCT 0 09/09/2017   MONOPCT 13 09/10/2017   MONOPCT 20.7 (H) 03/30/2017   EOSPCT 1 09/10/2017   EOSPCT 1.6 03/30/2017   BASOPCT 4 09/10/2017   BASOPCT 3.5 (H) 03/30/2017    CMP:  Lab Results  Component Value Date   NA 139 09/11/2017   NA 144 05/24/2017   NA 143 03/30/2017   NA 138 11/16/2013   K 4.6 09/11/2017   K 4.3 03/30/2017   K 4.5 11/16/2013   CL 100 (L) 09/11/2017   CL 110 (H) 03/30/2017   CO2 30 09/11/2017   CO2 27 03/30/2017   CO2 22 11/16/2013   BUN 47 (H) 09/11/2017   BUN 47 (A) 05/24/2017   BUN 38 (H) 03/30/2017   BUN 20.2 11/16/2013   CREATININE 1.22 09/11/2017   CREATININE 1.3 (H) 03/30/2017   CREATININE 1.2 11/16/2013   GLU 93 05/24/2017   PROT 6.4 (L) 09/11/2017   PROT 6.0 (L) 03/30/2017   PROT 6.7 11/16/2013   ALBUMIN 2.8 (L) 09/11/2017   ALBUMIN 2.8 (L) 03/30/2017   ALBUMIN 3.4 (L) 11/16/2013   BILITOT 0.8 09/11/2017   BILITOT 0.40 03/30/2017   BILITOT 0.35  11/16/2013   ALKPHOS 109 09/11/2017   ALKPHOS 116 (H) 03/30/2017   ALKPHOS 125 11/16/2013   AST 27 09/11/2017   AST 24 03/30/2017   AST 25 11/16/2013   ALT 17 09/11/2017   ALT 22 03/30/2017   ALT 11 11/16/2013  .   Total Discharge time is about 33 minutes  Roxan Hockey M.D on 09/12/2017 at 2:45 PM  [facility]   Office  [phone]  Voice Recognition Viviann Spare dictation system was used to create this note, attempts have been made to correct errors. Please contact the author  with questions and/or clarifications.

## 2017-09-12 NOTE — Progress Notes (Signed)
C/o new pain in his feet, requesting for MD to order xray. MD is aware

## 2017-09-20 ENCOUNTER — Telehealth: Payer: Self-pay | Admitting: *Deleted

## 2017-09-20 NOTE — Telephone Encounter (Signed)
Message received from patient requesting assistance with transportation to Dr.'s appt.    Call placed back to patient to inform him that this office does not provide assistance with transportation and for him to contact Medicare to see if they can offer him any assistance with transportation.  Pt is not happy with this suggestion, but states that he will call and let us know what information he finds out at his next appt on 09/22/17.

## 2017-09-22 ENCOUNTER — Encounter: Payer: Self-pay | Admitting: Hematology & Oncology

## 2017-09-22 ENCOUNTER — Inpatient Hospital Stay: Payer: Medicare Other

## 2017-09-22 ENCOUNTER — Inpatient Hospital Stay: Payer: Medicare Other | Attending: Hematology & Oncology | Admitting: Hematology & Oncology

## 2017-09-22 ENCOUNTER — Encounter: Payer: Self-pay | Admitting: Internal Medicine

## 2017-09-22 ENCOUNTER — Ambulatory Visit (INDEPENDENT_AMBULATORY_CARE_PROVIDER_SITE_OTHER): Payer: Medicare Other | Admitting: Internal Medicine

## 2017-09-22 ENCOUNTER — Other Ambulatory Visit: Payer: Self-pay

## 2017-09-22 VITALS — BP 181/61 | HR 62 | Resp 18

## 2017-09-22 VITALS — BP 128/84 | HR 62 | Temp 97.8°F | Resp 16 | Ht 75.0 in

## 2017-09-22 DIAGNOSIS — D471 Chronic myeloproliferative disease: Secondary | ICD-10-CM | POA: Insufficient documentation

## 2017-09-22 DIAGNOSIS — D469 Myelodysplastic syndrome, unspecified: Secondary | ICD-10-CM

## 2017-09-22 DIAGNOSIS — I5032 Chronic diastolic (congestive) heart failure: Secondary | ICD-10-CM

## 2017-09-22 DIAGNOSIS — R339 Retention of urine, unspecified: Secondary | ICD-10-CM | POA: Insufficient documentation

## 2017-09-22 DIAGNOSIS — N320 Bladder-neck obstruction: Secondary | ICD-10-CM

## 2017-09-22 DIAGNOSIS — I10 Essential (primary) hypertension: Secondary | ICD-10-CM | POA: Diagnosis not present

## 2017-09-22 DIAGNOSIS — D631 Anemia in chronic kidney disease: Secondary | ICD-10-CM

## 2017-09-22 DIAGNOSIS — D508 Other iron deficiency anemias: Secondary | ICD-10-CM

## 2017-09-22 LAB — CMP (CANCER CENTER ONLY)
ALBUMIN: 3.2 g/dL — AB (ref 3.5–5.0)
ALT: 23 U/L (ref 0–55)
AST: 26 U/L (ref 5–34)
Alkaline Phosphatase: 132 U/L (ref 40–150)
Anion gap: 6 (ref 3–11)
BUN: 62 mg/dL — ABNORMAL HIGH (ref 7–26)
CALCIUM: 10.2 mg/dL (ref 8.4–10.4)
CHLORIDE: 107 mmol/L (ref 98–109)
CO2: 27 mmol/L (ref 22–29)
CREATININE: 1.21 mg/dL (ref 0.70–1.30)
GFR, EST NON AFRICAN AMERICAN: 57 mL/min — AB (ref >60)
GFR, Est AFR Am: >60 mL/min (ref >60)
Glucose, Bld: 114 mg/dL (ref 70–140)
POTASSIUM: 4.6 mmol/L (ref 3.5–5.1)
Sodium: 140 mmol/L (ref 136–145)
Total Protein: 6.5 g/dL (ref 6.4–8.3)

## 2017-09-22 LAB — IRON AND TIBC
Iron: 47 ug/dL (ref 42–163)
Saturation Ratios: 19 % — ABNORMAL LOW (ref 42–163)
TIBC: 245 ug/dL (ref 202–409)
UIBC: 198 ug/dL

## 2017-09-22 LAB — SAMPLE TO BLOOD BANK

## 2017-09-22 LAB — CBC WITH DIFFERENTIAL (CANCER CENTER ONLY)
BASOS ABS: 0.5 10*3/uL — AB (ref 0.0–0.1)
Basophils Relative: 4 %
EOS PCT: 2 %
Eosinophils Absolute: 0.3 10*3/uL (ref 0.0–0.5)
HEMATOCRIT: 37.3 % — AB (ref 38.7–49.9)
HEMOGLOBIN: 11.6 g/dL — AB (ref 13.0–17.1)
LYMPHS PCT: 13 %
Lymphs Abs: 1.7 10*3/uL (ref 0.9–3.3)
MCH: 27.3 pg — ABNORMAL LOW (ref 28.0–33.4)
MCHC: 31.1 g/dL — ABNORMAL LOW (ref 32.0–35.9)
MCV: 87.8 fL (ref 82.0–98.0)
MONOS PCT: 19 %
Monocytes Absolute: 2.5 10*3/uL — ABNORMAL HIGH (ref 0.1–0.9)
NEUTROS PCT: 62 %
Neutro Abs: 8.3 10*3/uL — ABNORMAL HIGH (ref 1.5–6.5)
Platelet Count: 269 10*3/uL (ref 145–400)
RBC: 4.25 MIL/uL (ref 4.20–5.70)
RDW: 17.9 % — ABNORMAL HIGH (ref 11.1–15.7)
WBC: 13.3 10*3/uL — AB (ref 4.0–10.0)

## 2017-09-22 LAB — LACTATE DEHYDROGENASE: LDH: 750 U/L — ABNORMAL HIGH (ref 125–245)

## 2017-09-22 LAB — FERRITIN: FERRITIN: 306 ng/mL (ref 22–316)

## 2017-09-22 NOTE — Progress Notes (Signed)
Hematology and Oncology Follow Up Visit  Juan [name] [id number] 09-Feb-1941 77 y.o. 09/22/2017   Principle Diagnosis:   Chronic myeloproliferative disorder  Urinary retention  Current Therapy:    Observation     Interim History:  Juan [name] is back for follow-up.  His visits are very erratic because of transportation issues.  A lot of times, I will see him in the hospital when he is admitted.  He was admitted back in April.  He had vancomycin resistant enterococcus.  He has an indwelling Foley catheter because of urinary retention.  He had anemia.  He was transfused.  He does have a low erythropoietin level so we certainly can utilize [name] if necessary.  I rechecked his erythropoietin level today.  He is not hurting.  His appetite is doing okay.  He has had no nausea or vomiting.  He has had no cough.  He has had no rashes.  He has had no diarrhea.  Overall, I would say his performance status is ECOG 3.  Medications:  Current Outpatient Medications:  .  acetaminophen (TYLENOL) 325 MG tablet, Take 2 tablets (650 mg total) by mouth every 6 (six) hours as needed for mild pain (or Fever >/= 101)., Disp: , Rfl:  .  amLODipine (NORVASC) 5 MG tablet, Take 1 tablet (5 mg total) by mouth daily. For BP, Disp: 30 tablet, Rfl: 2 .  aspirin EC 81 MG tablet, Take 81 mg by mouth daily., Disp: , Rfl:  .  b complex vitamins tablet, Take 1 tablet by mouth daily., Disp: , Rfl:  .  benzonatate (TESSALON) 100 MG capsule, Take 1 capsule (100 mg total) by mouth 2 (two) times daily as needed for cough. (Patient not taking: Reported on 09/05/2017), Disp: 20 capsule, Rfl: 0 .  clotrimazole (LOTRIMIN AF) 1 % cream, Apply to Lt foot (between) Toes bid for athletes foot, Disp: 28 g, Rfl: 0 .  ferrous gluconate (FERGON) 240 (27 FE) MG tablet, Take 240 mg by mouth daily., Disp: , Rfl:  .  furosemide (LASIX) 40 MG tablet, Take 80mg  (2tabs) in morning and 40mg  (1tab) in evening, Disp: 90 tablet, Rfl: 2 .   hydrALAZINE (APRESOLINE) 50 MG tablet, Take 1 tablet (50 mg total) by mouth 3 (three) times daily., Disp: 90 tablet, Rfl: 3 .  ondansetron (ZOFRAN) 4 MG tablet, Take 1 tablet (4 mg total) by mouth every 6 (six) hours as needed for nausea or vomiting., Disp: 10 tablet, Rfl: 0 .  OXYGEN, Inhale 2 L into the lungs continuous., Disp: , Rfl:  .  tamsulosin (FLOMAX) 0.4 MG CAPS capsule, Take 1 capsule (0.4 mg total) by mouth daily after supper., Disp: 30 capsule, Rfl: 0  Allergies: No Known Allergies  Past Medical History, Surgical history, Social history, and Family History were reviewed and updated.  Review of Systems: Review of Systems  Constitutional: Negative.   HENT:  Negative.   Eyes: Negative.   Respiratory: Negative.   Cardiovascular: Negative.   Gastrointestinal: Negative.   Endocrine: Negative.   Genitourinary: Negative.    Musculoskeletal: Negative.   Skin: Negative.   Neurological: Negative.   Hematological: Negative.   Psychiatric/Behavioral: Negative.     Physical Exam:  blood pressure is 181/61 (abnormal) and his pulse is 62. His respiration is 18 and oxygen saturation is 100%.   Wt Readings from Last 3 Encounters:  09/09/17 189 lb 2.5 oz (85.8 kg)  09/03/17 193 lb (87.5 kg)  08/23/17 179 lb 3.2 oz (81.3 kg)  Physical Exam  Constitutional: He is oriented to person, place, and time.  HENT:  Head: Normocephalic and atraumatic.  Mouth/Throat: Oropharynx is clear and moist.  Eyes: Pupils are equal, round, and reactive to light. EOM are normal.  Neck: Normal range of motion.  Cardiovascular: Normal rate, regular rhythm and normal heart sounds.  Pulmonary/Chest: Effort normal and breath sounds normal.  Abdominal: Soft. Bowel sounds are normal.  Musculoskeletal: Normal range of motion. He exhibits no edema, tenderness or deformity.  Lymphadenopathy:    He has no cervical adenopathy.  Neurological: He is alert and oriented to person, place, and time.  Skin: Skin  is warm and dry. No rash noted. No erythema.  Psychiatric: He has a normal mood and affect. His behavior is normal. Judgment and thought content normal.  Vitals reviewed.    Lab Results  Component Value Date   WBC 13.3 (H) 09/22/2017   HGB 11.6 (L) 09/22/2017   HCT 37.3 (L) 09/22/2017   MCV 87.8 09/22/2017   PLT 269 09/22/2017     Chemistry      Component Value Date/Time   NA 139 09/11/2017 0801   NA 144 05/24/2017   NA 143 03/30/2017 1420   NA 138 11/16/2013 0847   K 4.6 09/11/2017 0801   K 4.3 03/30/2017 1420   K 4.5 11/16/2013 0847   CL 100 (L) 09/11/2017 0801   CL 110 (H) 03/30/2017 1420   CO2 30 09/11/2017 0801   CO2 27 03/30/2017 1420   CO2 22 11/16/2013 0847   BUN 47 (H) 09/11/2017 0801   BUN 47 (A) 05/24/2017   BUN 38 (H) 03/30/2017 1420   BUN 20.2 11/16/2013 0847   CREATININE 1.22 09/11/2017 0801   CREATININE 1.3 (H) 03/30/2017 1420   CREATININE 1.2 11/16/2013 0847   GLU 93 05/24/2017      Component Value Date/Time   CALCIUM 9.8 09/11/2017 0801   CALCIUM 9.8 03/30/2017 1420   CALCIUM 9.8 11/16/2013 0847   ALKPHOS 109 09/11/2017 0801   ALKPHOS 116 (H) 03/30/2017 1420   ALKPHOS 125 11/16/2013 0847   AST 27 09/11/2017 0801   AST 24 03/30/2017 1420   AST 25 11/16/2013 0847   ALT 17 09/11/2017 0801   ALT 22 03/30/2017 1420   ALT 11 11/16/2013 0847   BILITOT 0.8 09/11/2017 0801   BILITOT 0.40 03/30/2017 1420   BILITOT 0.35 11/16/2013 0847         Impression and Plan: Juan [name] is a 77 year old African-American male.  I must say that he is truly an enigma.  It is amazing that his disease has not transformed to acute leukemia.  I think his problems will definitely come from his indwelling Foley catheter.  Hopefully, he will not develop another UTI.  I would like to see him back in 6 weeks.  Again, if his hemoglobin drops, we can try him on [name].  With Juan [name], it is all about his quality of life.   Volanda Napoleon, MD 5/8/20199:39 AM

## 2017-09-22 NOTE — Patient Instructions (Signed)
  GO TO THE FRONT DESK Schedule your next appointment for a checkup 4 months from today  Please see your urologist as recommended

## 2017-09-22 NOTE — Progress Notes (Signed)
Subjective:    Patient ID: Juan [name], male    DOB: [birth year], 77 y.o.   MRN: [id number]  DOS:  09/22/2017 Type of visit - description : Follow-up Interval history: Since the last time I saw him 02-2017, he has been admitted to the hospital numerous times.  Today I will focus on the last admission. Admitted April 21, discharge 09/12/2017 He has a indwelling Foley d/t BPH and bladder diverticulum, was admitted with subjective fever, chills, flank pain. White count was noted to be elevated, UA consistent with infection. He was treated empirically with Tressie Ellis b/c his prior UCX showed Pseudomonas, during this admission however urine culture have no definite growth and antibiotics were discontinued. He gradually improved. HTN medication was adjusted Saw oncology during this admission Shortness of breath and chronic hypoxia was felt to be stable. CHF compensated  Wt Readings from Last 3 Encounters:  09/09/17 189 lb 2.5 oz (85.8 kg)  09/03/17 193 lb (87.5 kg)  08/23/17 179 lb 3.2 oz (81.3 kg)    Review of Systems So far  he is doing well, he is back at home, under the care of his family. No fever, occasional chills. Appetite slightly decreased but eats 3 meals a day. No nausea, did have an episode of vomiting 3 days ago which is now resolved. Denies abdominal pain diarrhea or change in the color of the stool  Past Medical History:  Diagnosis Date  . Allergic rhinitis   . Anemia   . Ascending aortic aneurysm ([place]) 03/2014   4.3cm on CT scan  . CAD (coronary artery disease)    dx elsewheer in past, no documentation. Non-ischemic myoview 2007  . Chronic diastolic CHF (congestive heart failure), NYHA class 2 (HCC)    Normal EF w/ grade 1 dd by echo 12/2015  . Chronic respiratory failure with hypoxia/2L at home    "2L; all the time" (05/03/2017)  . CKD (chronic kidney disease), stage III ([place])   . Edema    R>L leg, u/s 5-12 neg for DVT  . Hemorrhoid   . History of hydronephrosis     . History of Splenic infarct   . History of thrombocytosis   . Hypertension   . Iron deficiency anemia due to chronic blood loss 03/31/2017  . 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 Migraine    "once/wk at least" (07/11/2013)  . Moderate to severe pulmonary hypertension ([place])   . Myeloproliferative disease ([place])   . Pyelonephritis 04/29/2017  . Recurrent Pseudomonas urinary tract infection   . Shortness of breath   . Sinus congestion   . Sleep apnea, obstructive    at some point used CPAP, was d/c  years ago  . Type II diabetes mellitus ([place])   . UTI (urinary tract infection) 06/2017  . UTI (urinary tract infection) 07/2017    Past Surgical History:  Procedure Laterality Date  . FLEXIBLE SIGMOIDOSCOPY N/A 06/08/2017   Procedure: FLEXIBLE SIGMOIDOSCOPY;  Surgeon: Carol Ada, MD;  Location: [facility];  Service: Endoscopy;  Laterality: N/A;  . IR FLUORO GUIDE CV LINE RIGHT  05/07/2017  . IR FLUORO RM 30-60 MIN  05/07/2017  . IR US GUIDE VASC ACCESS RIGHT  05/07/2017  . TOE SURGERY Right    "tried to straighten out big toe" (07/11/2013)    Social History   Socioeconomic History  . Marital status: Widowed    Spouse name: Not on file  . Number of children: 2  . Years of education: Not on file  .  Highest education level: Not on file  Occupational History  . Occupation: retired     Fish farm manager: RETIRED  Social Needs  . Financial resource strain: Not on file  . Food insecurity:    Worry: Not on file    Inability: Not on file  . Transportation needs:    Medical: Not on file    Non-medical: Not on file  Tobacco Use  . Smoking status: Former Smoker    Packs/day: 0.25    Years: 12.00    Pack years: 3.00    Types: Cigarettes    Last attempt to quit: 05/18/1966    Years since quitting: 51.3  . Smokeless tobacco: Never Used  . Tobacco comment: quit smoking 45 years ago  Substance and Sexual Activity  . Alcohol use: No    Alcohol/week: 0.0 oz  . Drug use: No  . Sexual  activity: Yes  Lifestyle  . Physical activity:    Days per week: Not on file    Minutes per session: Not on file  . Stress: Not on file  Relationships  . Social connections:    Talks on phone: Not on file    Gets together: Not on file    Attends religious service: Not on file    Active member of club or organization: Not on file    Attends meetings of clubs or organizations: Not on file    Relationship status: Not on file  . Intimate partner violence:    Fear of current or ex partner: Not on file    Emotionally abused: Not on file    Physically abused: Not on file    Forced sexual activity: Not on file  Other Topics Concern  . Not on file  Social History Narrative   Moved from [place] 2006   Widow 2007   Son lives with him       Admitted to [facility]08/17   Former smoker-stopped 1968   Alcohol none   Full Code      Allergies as of 09/22/2017   No Known Allergies     Medication List        Accurate as of 09/22/17 11:59 PM. Always use your most recent med list.          acetaminophen 325 MG tablet Commonly known as:  TYLENOL Take 2 tablets (650 mg total) by mouth every 6 (six) hours as needed for mild pain (or Fever >/= 101).   amLODipine 5 MG tablet Commonly known as:  NORVASC Take 1 tablet (5 mg total) by mouth daily. For BP   aspirin EC 81 MG tablet Take 81 mg by mouth daily.   b complex vitamins tablet Take 1 tablet by mouth daily.   clotrimazole 1 % cream Commonly known as:  LOTRIMIN AF Apply to Lt foot (between) Toes bid for athletes foot   ferrous gluconate 240 (27 FE) MG tablet Commonly known as:  FERGON Take 240 mg by mouth daily.   furosemide 40 MG tablet Commonly known as:  LASIX Take 80mg  (2tabs) in morning and 40mg  (1tab) in evening   hydrALAZINE 50 MG tablet Commonly known as:  APRESOLINE Take 1 tablet (50 mg total) by mouth 3 (three) times daily.   ondansetron 4 MG tablet Commonly known as:  ZOFRAN Take 1 tablet (4  mg total) by mouth every 6 (six) hours as needed for nausea or vomiting.   OXYGEN Inhale 2 L into the lungs continuous.   tamsulosin  0.4 MG Caps capsule Commonly known as:  FLOMAX Take 1 capsule (0.4 mg total) by mouth daily after supper.          Objective:   Physical Exam BP 128/84 (BP Location: Left Arm, Patient Position: Sitting, Cuff Size: Normal)   Pulse 62   Temp 97.8 F (36.6 C) (Oral)   Resp 16   Ht 6\' 3"  (1.905 m)   SpO2 96%   BMI 23.64 kg/m  General:   Well developed, elderly gentleman, no distress, sitting in a wheelchair HEENT:  Normocephalic . Face symmetric, atraumatic Lungs:  CTA B Normal respiratory effort, no intercostal retractions, no accessory muscle use. Heart: RRR, soft systolic murmur?  Marland Kitchen  No pretibial edema bilaterally  Skin: Not pale. Not jaundice Neurologic:  alert & oriented X3.  Speech normal, gait not tested Psych--  Cooperative with normal attention span and concentration.  No anxious or depressed appearing.      Assessment & Plan:  Assessment Prediabetes  Neuropathy, on Neurontin prn and hydrocodone prn HTN CKD, creatinine ~ 1.7-1.9 Chronic edema and stasis dermatitis Morbid obesity CV: --CHF, EF 30% 01-2015, normal Myoview 2007, refuse it cardiac catheterization --Ascending aortic aneurysm per CT angio 03-2014, 4.3 cm --CT chest without done 06-2014 --  Refused MRA of the Ao 2016  --pulmonary hypertension: 10-2016: V/Q can neg, [id number]-ANCa (-) Hypoxia: On home oxygen since admission 10/2016 Hem-onc: ---Myeloproliferative disease , refuses treatment --- anemia --- thrombocytosiscytosis  GU: BPH- bladder outlet obstruction-chronic Foley Per urology 2017: "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 They recommend continuing Foley catheter, change every 4 weeks " OSA, used a CPAP at some point Poor compliance with  advice:  Saw psychiatry 04/07/2014 at the hospital and deemed to be competent to make his own decisions. Deemed completed again March 2019  PLAN: Recent multiple hospital admissions, labs from today reviewed, stable. Bladder outlet obstruction: Seems to be doing okay with a Foley, we will call the urology office to be sure he has a follow-up appointment in one week (as the patient said today); if not, will arrange a OV (addendum, has a f/u 09/27/17) HTN: At the last admission, BP meds adjusted, currently on amlodipine, Lasix, hydralazine.  BMP today satisfactory Hypoxia: On O2 supplements CHF: Seems controlled, wt stable Social: The patient had multiple admissions, today he clearly refused any help such as hospice, palliative care or home health visits.  Reports she is under the care of his son, grandson and daughter. RTC  4 months

## 2017-09-22 NOTE — Progress Notes (Signed)
Pre visit review using our clinic review tool, if applicable. No additional management support is needed unless otherwise documented below in the visit note. 

## 2017-09-23 ENCOUNTER — Encounter (HOSPITAL_COMMUNITY): Payer: Self-pay | Admitting: Emergency Medicine

## 2017-09-23 ENCOUNTER — Emergency Department (HOSPITAL_COMMUNITY): Payer: Medicare Other

## 2017-09-23 ENCOUNTER — Inpatient Hospital Stay (HOSPITAL_COMMUNITY)
Admission: EM | Admit: 2017-09-23 | Discharge: 2017-09-26 | DRG: 603 | Disposition: A | Discharge status: Home / Self Care | Payer: Medicare Other | Attending: Internal Medicine | Admitting: Internal Medicine

## 2017-09-23 DIAGNOSIS — L03211 Cellulitis of face: Principal | ICD-10-CM | POA: Diagnosis present

## 2017-09-23 DIAGNOSIS — N179 Acute kidney failure, unspecified: Secondary | ICD-10-CM | POA: Diagnosis not present

## 2017-09-23 DIAGNOSIS — D7581 Myelofibrosis: Secondary | ICD-10-CM | POA: Diagnosis present

## 2017-09-23 DIAGNOSIS — N183 Chronic kidney disease, stage 3 (moderate): Secondary | ICD-10-CM | POA: Diagnosis present

## 2017-09-23 DIAGNOSIS — E1122 Type 2 diabetes mellitus with diabetic chronic kidney disease: Secondary | ICD-10-CM | POA: Diagnosis present

## 2017-09-23 DIAGNOSIS — I130 Hypertensive heart and chronic kidney disease with heart failure and stage 1 through stage 4 chronic kidney disease, or unspecified chronic kidney disease: Secondary | ICD-10-CM | POA: Diagnosis not present

## 2017-09-23 DIAGNOSIS — J9611 Chronic respiratory failure with hypoxia: Secondary | ICD-10-CM | POA: Diagnosis present

## 2017-09-23 DIAGNOSIS — Z87891 Personal history of nicotine dependence: Secondary | ICD-10-CM

## 2017-09-23 DIAGNOSIS — L03213 Periorbital cellulitis: Secondary | ICD-10-CM | POA: Diagnosis not present

## 2017-09-23 DIAGNOSIS — I272 Pulmonary hypertension, unspecified: Secondary | ICD-10-CM | POA: Diagnosis present

## 2017-09-23 DIAGNOSIS — K0889 Other specified disorders of teeth and supporting structures: Secondary | ICD-10-CM | POA: Diagnosis not present

## 2017-09-23 DIAGNOSIS — I712 Thoracic aortic aneurysm, without rupture: Secondary | ICD-10-CM | POA: Diagnosis present

## 2017-09-23 DIAGNOSIS — D500 Iron deficiency anemia secondary to blood loss (chronic): Secondary | ICD-10-CM | POA: Diagnosis present

## 2017-09-23 DIAGNOSIS — Z9981 Dependence on supplemental oxygen: Secondary | ICD-10-CM

## 2017-09-23 DIAGNOSIS — C946 Myelodysplastic disease, not classified: Secondary | ICD-10-CM | POA: Diagnosis present

## 2017-09-23 DIAGNOSIS — Z7982 Long term (current) use of aspirin: Secondary | ICD-10-CM

## 2017-09-23 DIAGNOSIS — G4733 Obstructive sleep apnea (adult) (pediatric): Secondary | ICD-10-CM | POA: Diagnosis present

## 2017-09-23 DIAGNOSIS — R221 Localized swelling, mass and lump, neck: Secondary | ICD-10-CM | POA: Diagnosis not present

## 2017-09-23 DIAGNOSIS — I2510 Atherosclerotic heart disease of native coronary artery without angina pectoris: Secondary | ICD-10-CM | POA: Diagnosis present

## 2017-09-23 DIAGNOSIS — I5032 Chronic diastolic (congestive) heart failure: Secondary | ICD-10-CM | POA: Diagnosis not present

## 2017-09-23 LAB — BASIC METABOLIC PANEL
Anion gap: 8 (ref 5–15)
BUN: 61 mg/dL — ABNORMAL HIGH (ref 6–20)
CO2: 27 mmol/L (ref 22–32)
Calcium: 9.6 mg/dL (ref 8.9–10.3)
Chloride: 104 mmol/L (ref 101–111)
Creatinine, Ser: 1.3 mg/dL — ABNORMAL HIGH (ref 0.61–1.24)
GFR calc Af Amer: >60 mL/min (ref >60)
GFR calc non Af Amer: 52 mL/min — ABNORMAL LOW (ref >60)
Glucose, Bld: 100 mg/dL — ABNORMAL HIGH (ref 65–99)
Potassium: 4.9 mmol/L (ref 3.5–5.1)
Sodium: 139 mmol/L (ref 135–145)

## 2017-09-23 LAB — CBC WITH DIFFERENTIAL/PLATELET
Basophils Absolute: 0.5 10*3/uL — ABNORMAL HIGH (ref 0.0–0.1)
Basophils Relative: 4 %
Eosinophils Absolute: 0.3 10*3/uL (ref 0.0–0.7)
Eosinophils Relative: 2 %
HCT: 34.9 % — ABNORMAL LOW (ref 39.0–52.0)
Hemoglobin: 10.8 g/dL — ABNORMAL LOW (ref 13.0–17.0)
Lymphocytes Relative: 12 %
Lymphs Abs: 1.6 10*3/uL (ref 0.7–4.0)
MCH: 26.9 pg (ref 26.0–34.0)
MCHC: 30.9 g/dL (ref 30.0–36.0)
MCV: 87 fL (ref 78.0–100.0)
Monocytes Absolute: 3.4 10*3/uL — ABNORMAL HIGH (ref 0.1–1.0)
Monocytes Relative: 26 %
Neutro Abs: 7.3 10*3/uL (ref 1.7–7.7)
Neutrophils Relative %: 56 %
Platelets: 246 10*3/uL (ref 150–400)
RBC: 4.01 MIL/uL — ABNORMAL LOW (ref 4.22–5.81)
RDW: 18.3 % — ABNORMAL HIGH (ref 11.5–15.5)
WBC: 13.1 10*3/uL — ABNORMAL HIGH (ref 4.0–10.5)

## 2017-09-23 LAB — ERYTHROPOIETIN: Erythropoietin: 3.4 m[IU]/mL (ref 2.6–18.5)

## 2017-09-23 MED ORDER — IOHEXOL 300 MG/ML  SOLN
100.0000 mL | Freq: Once | INTRAMUSCULAR | Status: AC | PRN
  Administered 2017-09-23: 100 mL via INTRAVENOUS

## 2017-09-23 NOTE — Assessment & Plan Note (Addendum)
Recent multiple hospital admissions, labs from today reviewed, stable. Bladder outlet obstruction: Seems to be doing okay with a Foley, we will call the urology office to be sure he has a follow-up appointment in one week (as the patient said today); if not, will arrange a OV (addendum, has a f/u 09/27/17)  HTN: At the last admission, BP meds adjusted, currently on amlodipine, Lasix, hydralazine.  BMP today satisfactory Hypoxia: On O2 supplements CHF: Seems controlled, wt stable Social: The patient had multiple admissions, today he clearly refused any help such as hospice, palliative care or home health visits.  Reports she is under the care of his son, grandson and daughter. RTC  4 months

## 2017-09-23 NOTE — ED Provider Notes (Addendum)
[facility] EMERGENCY DEPARTMENT Provider Note   CSN: [id number] Arrival date & time: 09/23/17  1824     History   Chief Complaint Chief Complaint  Patient presents with  . Dental Pain    HPI Juan [name] is a 77 y.o. male.  HPI   77 year old male presents today with complaints of facial swelling.  Patient notes 3-day history of worsening dental pain and facial swelling.  Patient denies any discharge, denies any fever, denies any ocular involvement including decreased range of motion of the eyes.  No other infectious etiology.  No trauma to the face.    Past Medical History:  Diagnosis Date  . Allergic rhinitis   . Anemia   . Ascending aortic aneurysm ([place]) 03/2014   4.3cm on CT scan  . CAD (coronary artery disease)    dx elsewheer in past, no documentation. Non-ischemic myoview 2007  . Chronic diastolic CHF (congestive heart failure), NYHA class 2 (HCC)    Normal EF w/ grade 1 dd by echo 12/2015  . Chronic respiratory failure with hypoxia/2L at home    "2L; all the time" (05/03/2017)  . CKD (chronic kidney disease), stage III ([place])   . Edema    R>L leg, u/s 5-12 neg for DVT  . Hemorrhoid   . History of hydronephrosis   . History of Splenic infarct   . History of thrombocytosis   . Hypertension   . Iron deficiency anemia due to chronic blood loss 03/31/2017  . 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 Migraine    "once/wk at least" (07/11/2013)  . Moderate to severe pulmonary hypertension ([place])   . Myeloproliferative disease ([place])   . Pyelonephritis 04/29/2017  . Recurrent Pseudomonas urinary tract infection   . Shortness of breath   . Sinus congestion   . Sleep apnea, obstructive    at some point used CPAP, was d/c  years ago  . Type II diabetes mellitus ([place])   . UTI (urinary tract infection) 06/2017  . UTI (urinary tract infection) 07/2017    Patient Active Problem List   Diagnosis Date Noted  . UTI (urinary tract infection) 09/05/2017  . VRE  (vancomycin resistant enterococcus) culture positive   . Staphylococcus epidermidis infection   . Acute encephalopathy 08/18/2017  . Sleep apnea, obstructive 08/18/2017  . 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 History of recurrent Pseudomonas urinary tract infection 08/18/2017  . Moderate to severe pulmonary hypertension ([place]) 08/18/2017  . CKD (chronic kidney disease), stage III ([place]) 08/18/2017  . Chronic respiratory failure with hypoxia ([place]) 08/18/2017  . Chronic diastolic CHF (congestive heart failure), NYHA class 2 ([place]) 08/18/2017  . 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 HTN (hypertension) 08/18/2017  . Evaluation by psychiatric service required   . Pyelonephritis 07/17/2017  . Recurrent UTI 07/16/2017  . Acute exacerbation of CHF (congestive heart failure) ([place]) 07/06/2017  . Complicated UTI (urinary tract infection) 07/06/2017  . Acute GI bleeding 06/07/2017  . Hypokalemia   . Iron deficiency anemia due to chronic blood loss 03/31/2017  . 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 Urinary retention 01/24/2017  . 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 Elevated troponin 11/14/2016  . Recurrent pneumonia 10/31/2016  . Pulmonary hypertension severe   . Sprain of left rotator cuff capsule   . Hyperkalemia   . Paroxysmal SVT (supraventricular tachycardia) ([place]) 04/20/2016  . Type II diabetes mellitus ([place]) 04/20/2016  . Gout 04/20/2016  . B12 deficiency 04/20/2016  . 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 Hydronephrosis   . Thrombocytosis ([place]) 04/07/2016  . Pressure ulcer stage II 12/31/2015  . T wave inversion in EKG 12/29/2015  . PCP NOTES >>>>>>>>>>>>>>>>> 05/02/2015  . Peripheral edema   . Thoracic aortic aneurysm ([place]) 04/26/2014  . CKD (chronic kidney disease) stage 3, GFR 30-59 ml/min (HCC) 04/26/2014  . Generalized weakness 04/06/2014  . Venous stasis dermatitis of both lower extremities   . Morbid obesity  due to excess calories ([place]) 03/29/2014  . 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 Leukocytosis 10/17/2013  . Poor compliance with advise  05/05/2012  . Annual physical exam 01/14/2012  . Weight loss 01/14/2012  . BPH (benign prostatic hyperplasia) 01/14/2012  . Myeloproliferative disease ([place]) 09/07/2006  . Obstructive sleep apnea 09/07/2006  . 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 Coronary atherosclerosis 09/07/2006  . Chronic diastolic CHF (congestive heart failure) ([place]) 09/07/2006  . Allergic rhinitis 09/07/2006    Past Surgical History:  Procedure Laterality Date  . FLEXIBLE SIGMOIDOSCOPY N/A 06/08/2017   Procedure: FLEXIBLE SIGMOIDOSCOPY;  Surgeon: Carol Ada, MD;  Location: [facility];  Service: Endoscopy;  Laterality: N/A;  . IR FLUORO GUIDE CV LINE RIGHT  05/07/2017  . IR FLUORO RM 30-60 MIN  05/07/2017  . IR US GUIDE VASC ACCESS RIGHT  05/07/2017  . TOE SURGERY Right    "tried to straighten out big toe" (07/11/2013)        Home Medications    Prior to Admission medications   Medication Sig Start Date End Date Taking? Authorizing Provider  acetaminophen (TYLENOL) 325 MG tablet Take 2 tablets (650 mg total) by mouth every 6 (six) hours as needed for mild pain (or Fever >/= 101). 05/08/17   Rai, Ripudeep K, MD  amLODipine (NORVASC) 5 MG tablet Take 1 tablet (5 mg total) by mouth daily. For BP 09/13/17   Emokpae, Courage, MD  aspirin EC 81 MG tablet Take 81 mg by mouth daily.    [provider]  b complex vitamins tablet Take 1 tablet by mouth daily.    [provider]  clotrimazole (LOTRIMIN AF) 1 % cream Apply to Lt foot (between) Toes bid for athletes foot 09/12/17   Roxan Hockey, MD  ferrous gluconate (FERGON) 240 (27 FE) MG tablet Take 240 mg by mouth daily.    [provider]  furosemide (LASIX) 40 MG tablet Take 80mg  (2tabs) in morning and 40mg  (1tab) in evening  09/12/17   Roxan Hockey, MD  hydrALAZINE (APRESOLINE) 50 MG tablet Take 1 tablet (50 mg total) by mouth 3 (three) times daily. 09/12/17   Roxan Hockey, MD  ondansetron (ZOFRAN) 4 MG tablet Take 1 tablet (4 mg total) by mouth every 6 (six) hours as needed for nausea or vomiting. 09/12/17   Roxan Hockey, MD  OXYGEN Inhale 2 L into the lungs continuous.    [provider]  tamsulosin (FLOMAX) 0.4 MG CAPS capsule Take 1 capsule (0.4 mg total) by mouth daily after supper. 06/13/17   Bonnielee Haff, MD    Family History Family History  Problem Relation Age of Onset  . Schizophrenia Son   . Mental illness Daughter   . Colon cancer Neg Hx   . Prostate cancer Neg Hx   . Heart attack Neg Hx   . Diabetes Neg Hx     Social History Social History   Tobacco Use  . Smoking status: Former Smoker    Packs/day: 0.25  Years: 12.00    Pack years: 3.00    Types: Cigarettes    Last attempt to quit: 05/18/1966    Years since quitting: 51.3  . Smokeless tobacco: Never Used  . Tobacco comment: quit smoking 45 years ago  Substance Use Topics  . Alcohol use: No    Alcohol/week: 0.0 oz  . Drug use: No     Allergies   Patient has no known allergies.   Review of Systems Review of Systems  All other systems reviewed and are negative.    Physical Exam Updated Vital Signs BP (!) 166/66   Pulse (!) 56   Temp 98.4 F (36.9 C) (Oral)   Resp 16   SpO2 100%   Physical Exam  Constitutional: He is oriented to person, place, and time. He appears well-developed and well-nourished.  HENT:  Head: Normocephalic and atraumatic.  Soft tissue swelling noted from the left upper lip to the inferior periocular structures, no fluctuance-line palpated with no fluctuant abscess-numerous dental caries noted with surgical dental extractions  Extraocular movements are intact and pain-free  Eyes: Pupils are equal, round, and reactive to light. Conjunctivae are normal. Right eye exhibits no  discharge. Left eye exhibits no discharge. No scleral icterus.  Neck: Normal range of motion. No JVD present. No tracheal deviation present.  Pulmonary/Chest: Effort normal. No stridor.  Neurological: He is alert and oriented to person, place, and time. Coordination normal.  Psychiatric: He has a normal mood and affect. His behavior is normal. Judgment and thought content normal.  Nursing note and vitals reviewed.   ED Treatments / Results  Labs (all labs ordered are listed, but only abnormal results are displayed) Labs Reviewed  CBC WITH DIFFERENTIAL/PLATELET - Abnormal; Notable for the following components:      Result Value   WBC 13.1 (*)    RBC 4.01 (*)    Hemoglobin 10.8 (*)    HCT 34.9 (*)    RDW 18.3 (*)    Monocytes Absolute 3.4 (*)    Basophils Absolute 0.5 (*)    All other components within normal limits  BASIC METABOLIC PANEL - Abnormal; Notable for the following components:   Glucose, Bld 100 (*)    BUN 61 (*)    Creatinine, Ser 1.30 (*)    GFR calc non Af Amer 52 (*)    All other components within normal limits    EKG None  Radiology No results found.  Procedures Procedures (including critical care time)  Medications Ordered in ED Medications - No data to display   Initial Impression / Assessment and Plan / ED Course  I have reviewed the triage vital signs and the nursing notes.  Pertinent labs & imaging results that were available during my care of the patient were reviewed by me and considered in my medical decision making (see chart for details).      Final Clinical Impressions(s) / ED Diagnoses   Final diagnoses:  Preseptal cellulitis   Labs: CBC, BMP  Imaging: CT maxillofacial  Consults:  Therapeutics:  Discharge Meds:   Assessment/Plan: 77 year old male presents today with complaints of facial swelling.  This appears to be cellulitic in nature with concern for preseptal cellulitis, although I do have suspicion for dental involvement  as well.  Due to patient's baseline health status and significant facial swelling CT maxillofacial will be ordered to rule out acute dental abscess or drainable fluid collection.  I do anticipate that patient will likely need initial management with IV antibiotics  and hospitalization.  Patient care signed to oncoming provider pending CT results and disposition.  Patient care was shared with Dr. Vanita Panda.  ED Discharge Orders    None        Francee Gentile 09/23/17 [id number]    Carmin Muskrat, MD 09/23/17 2238

## 2017-09-23 NOTE — ED Triage Notes (Addendum)
Pt arrives via EMS. Pt complains of generalized dental pain and swelling until left eye. Pt's vision intact. No other complaints

## 2017-09-23 NOTE — ED Notes (Signed)
Patient transported to CT 

## 2017-09-24 ENCOUNTER — Other Ambulatory Visit: Payer: Self-pay

## 2017-09-24 ENCOUNTER — Encounter (HOSPITAL_COMMUNITY): Payer: Self-pay | Admitting: Internal Medicine

## 2017-09-24 DIAGNOSIS — N179 Acute kidney failure, unspecified: Secondary | ICD-10-CM

## 2017-09-24 DIAGNOSIS — I5032 Chronic diastolic (congestive) heart failure: Secondary | ICD-10-CM

## 2017-09-24 DIAGNOSIS — D500 Iron deficiency anemia secondary to blood loss (chronic): Secondary | ICD-10-CM | POA: Diagnosis present

## 2017-09-24 DIAGNOSIS — I272 Pulmonary hypertension, unspecified: Secondary | ICD-10-CM | POA: Diagnosis present

## 2017-09-24 DIAGNOSIS — I130 Hypertensive heart and chronic kidney disease with heart failure and stage 1 through stage 4 chronic kidney disease, or unspecified chronic kidney disease: Secondary | ICD-10-CM | POA: Diagnosis present

## 2017-09-24 DIAGNOSIS — J9611 Chronic respiratory failure with hypoxia: Secondary | ICD-10-CM | POA: Diagnosis present

## 2017-09-24 DIAGNOSIS — L03211 Cellulitis of face: Secondary | ICD-10-CM | POA: Diagnosis not present

## 2017-09-24 DIAGNOSIS — Z7982 Long term (current) use of aspirin: Secondary | ICD-10-CM | POA: Diagnosis not present

## 2017-09-24 DIAGNOSIS — R279 Unspecified lack of coordination: Secondary | ICD-10-CM | POA: Diagnosis not present

## 2017-09-24 DIAGNOSIS — E1122 Type 2 diabetes mellitus with diabetic chronic kidney disease: Secondary | ICD-10-CM | POA: Diagnosis present

## 2017-09-24 DIAGNOSIS — Z9981 Dependence on supplemental oxygen: Secondary | ICD-10-CM | POA: Diagnosis not present

## 2017-09-24 DIAGNOSIS — G4733 Obstructive sleep apnea (adult) (pediatric): Secondary | ICD-10-CM | POA: Diagnosis present

## 2017-09-24 DIAGNOSIS — C946 Myelodysplastic disease, not classified: Secondary | ICD-10-CM | POA: Diagnosis present

## 2017-09-24 DIAGNOSIS — I712 Thoracic aortic aneurysm, without rupture: Secondary | ICD-10-CM | POA: Diagnosis present

## 2017-09-24 DIAGNOSIS — I2510 Atherosclerotic heart disease of native coronary artery without angina pectoris: Secondary | ICD-10-CM | POA: Diagnosis present

## 2017-09-24 DIAGNOSIS — Z87891 Personal history of nicotine dependence: Secondary | ICD-10-CM | POA: Diagnosis not present

## 2017-09-24 DIAGNOSIS — Z743 Need for continuous supervision: Secondary | ICD-10-CM | POA: Diagnosis not present

## 2017-09-24 DIAGNOSIS — L03213 Periorbital cellulitis: Secondary | ICD-10-CM | POA: Diagnosis not present

## 2017-09-24 DIAGNOSIS — N183 Chronic kidney disease, stage 3 (moderate): Secondary | ICD-10-CM | POA: Diagnosis present

## 2017-09-24 LAB — CBC
HEMATOCRIT: 32.9 % — AB (ref 39.0–52.0)
Hemoglobin: 9.9 g/dL — ABNORMAL LOW (ref 13.0–17.0)
MCH: 25.8 pg — ABNORMAL LOW (ref 26.0–34.0)
MCHC: 30.1 g/dL (ref 30.0–36.0)
MCV: 85.9 fL (ref 78.0–100.0)
Platelets: 242 10*3/uL (ref 150–400)
RBC: 3.83 MIL/uL — ABNORMAL LOW (ref 4.22–5.81)
RDW: 18 % — AB (ref 11.5–15.5)
WBC: 12.3 10*3/uL — AB (ref 4.0–10.5)

## 2017-09-24 LAB — BASIC METABOLIC PANEL
Anion gap: 9 (ref 5–15)
BUN: 57 mg/dL — AB (ref 6–20)
CHLORIDE: 108 mmol/L (ref 101–111)
CO2: 25 mmol/L (ref 22–32)
Calcium: 9.6 mg/dL (ref 8.9–10.3)
Creatinine, Ser: 1.12 mg/dL (ref 0.61–1.24)
GFR calc Af Amer: >60 mL/min (ref >60)
GFR calc non Af Amer: >60 mL/min (ref >60)
GLUCOSE: 123 mg/dL — AB (ref 65–99)
POTASSIUM: 4.3 mmol/L (ref 3.5–5.1)
SODIUM: 142 mmol/L (ref 135–145)

## 2017-09-24 LAB — GLUCOSE, CAPILLARY: GLUCOSE-CAPILLARY: 92 mg/dL (ref 65–99)

## 2017-09-24 MED ORDER — ONDANSETRON HCL 4 MG PO TABS: 4.0000 mg | ORAL_TABLET | Freq: Four times a day (QID) | ORAL | Status: DC | PRN

## 2017-09-24 MED ORDER — HYDRALAZINE HCL 25 MG PO TABS
50.0000 mg | ORAL_TABLET | Freq: Three times a day (TID) | ORAL | Status: DC
  Administered 2017-09-24 – 2017-09-26 (×8): 50 mg via ORAL
  Filled 2017-09-24 (×9): qty 2

## 2017-09-24 MED ORDER — ASPIRIN EC 81 MG PO TBEC
81.0000 mg | DELAYED_RELEASE_TABLET | Freq: Every day | ORAL | Status: DC
  Administered 2017-09-24 – 2017-09-26 (×3): 81 mg via ORAL
  Filled 2017-09-24 (×3): qty 1

## 2017-09-24 MED ORDER — AMLODIPINE BESYLATE 5 MG PO TABS
5.0000 mg | ORAL_TABLET | Freq: Every day | ORAL | Status: DC
  Administered 2017-09-24 – 2017-09-26 (×3): 5 mg via ORAL
  Filled 2017-09-24 (×3): qty 1

## 2017-09-24 MED ORDER — FUROSEMIDE 20 MG PO TABS: 40.0000 mg | ORAL_TABLET | Freq: Two times a day (BID) | ORAL | Status: DC

## 2017-09-24 MED ORDER — VANCOMYCIN HCL IN DEXTROSE 750-5 MG/150ML-% IV SOLN
750.0000 mg | Freq: Two times a day (BID) | INTRAVENOUS | Status: DC
  Administered 2017-09-24 – 2017-09-25 (×3): 750 mg via INTRAVENOUS
  Filled 2017-09-24 (×3): qty 150

## 2017-09-24 MED ORDER — ACETAMINOPHEN 650 MG RE SUPP: 650.0000 mg | Freq: Four times a day (QID) | RECTAL | Status: DC | PRN

## 2017-09-24 MED ORDER — TAMSULOSIN HCL 0.4 MG PO CAPS
0.4000 mg | ORAL_CAPSULE | Freq: Every day | ORAL | Status: DC
  Administered 2017-09-24 – 2017-09-25 (×2): 0.4 mg via ORAL
  Filled 2017-09-24 (×2): qty 1

## 2017-09-24 MED ORDER — FERROUS GLUCONATE 324 (38 FE) MG PO TABS
324.0000 mg | ORAL_TABLET | Freq: Every day | ORAL | Status: DC
  Administered 2017-09-24 – 2017-09-26 (×2): 324 mg via ORAL
  Filled 2017-09-24 (×3): qty 1

## 2017-09-24 MED ORDER — FENTANYL CITRATE (PF) 100 MCG/2ML IJ SOLN
50.0000 ug | Freq: Once | INTRAMUSCULAR | Status: AC
  Administered 2017-09-24: 50 ug via INTRAVENOUS
  Filled 2017-09-24: qty 2

## 2017-09-24 MED ORDER — ORAL CARE MOUTH RINSE
15.0000 mL | Freq: Two times a day (BID) | OROMUCOSAL | Status: DC
  Administered 2017-09-24 – 2017-09-26 (×3): 15 mL via OROMUCOSAL

## 2017-09-24 MED ORDER — B COMPLEX-C PO TABS
1.0000 | ORAL_TABLET | Freq: Every day | ORAL | Status: DC
  Administered 2017-09-24 – 2017-09-26 (×3): 1 via ORAL
  Filled 2017-09-24 (×3): qty 1

## 2017-09-24 MED ORDER — NYSTATIN-TRIAMCINOLONE 100000-0.1 UNIT/GM-% EX CREA
TOPICAL_CREAM | Freq: Two times a day (BID) | CUTANEOUS | Status: DC
  Administered 2017-09-24 – 2017-09-26 (×5): via TOPICAL
  Filled 2017-09-24: qty 15

## 2017-09-24 MED ORDER — CLINDAMYCIN PHOSPHATE 600 MG/50ML IV SOLN
600.0000 mg | Freq: Once | INTRAVENOUS | Status: AC
  Administered 2017-09-24: 600 mg via INTRAVENOUS
  Filled 2017-09-24: qty 50

## 2017-09-24 MED ORDER — PIPERACILLIN-TAZOBACTAM 3.375 G IVPB
3.3750 g | Freq: Three times a day (TID) | INTRAVENOUS | Status: DC
  Administered 2017-09-24 – 2017-09-26 (×7): 3.375 g via INTRAVENOUS
  Filled 2017-09-24 (×9): qty 50

## 2017-09-24 MED ORDER — ONDANSETRON HCL 4 MG/2ML IJ SOLN: 4.0000 mg | Freq: Four times a day (QID) | INTRAMUSCULAR | Status: DC | PRN

## 2017-09-24 MED ORDER — HYDRALAZINE HCL 20 MG/ML IJ SOLN: 10.0000 mg | INTRAMUSCULAR | Status: DC | PRN

## 2017-09-24 MED ORDER — FUROSEMIDE 20 MG PO TABS
40.0000 mg | ORAL_TABLET | Freq: Two times a day (BID) | ORAL | Status: DC
  Administered 2017-09-24 – 2017-09-26 (×5): 40 mg via ORAL
  Filled 2017-09-24 (×5): qty 2

## 2017-09-24 MED ORDER — ACETAMINOPHEN 325 MG PO TABS
650.0000 mg | ORAL_TABLET | Freq: Four times a day (QID) | ORAL | Status: DC | PRN
  Administered 2017-09-24 – 2017-09-26 (×2): 650 mg via ORAL
  Filled 2017-09-24 (×2): qty 2

## 2017-09-24 NOTE — ED Provider Notes (Signed)
Results for orders placed or performed during the hospital encounter of 09/23/17  CBC with Differential  Result Value Ref Range   WBC 13.1 (H) 4.0 - 10.5 K/uL   RBC 4.01 (L) 4.22 - 5.81 MIL/uL   Hemoglobin 10.8 (L) 13.0 - 17.0 g/dL   HCT 34.9 (L) 39.0 - 52.0 %   MCV 87.0 78.0 - 100.0 fL   MCH 26.9 26.0 - 34.0 pg   MCHC 30.9 30.0 - 36.0 g/dL   RDW 18.3 (H) 11.5 - 15.5 %   Platelets 246 150 - 400 K/uL   Neutrophils Relative % 56 %   Lymphocytes Relative 12 %   Monocytes Relative 26 %   Eosinophils Relative 2 %   Basophils Relative 4 %   Neutro Abs 7.3 1.7 - 7.7 K/uL   Lymphs Abs 1.6 0.7 - 4.0 K/uL   Monocytes Absolute 3.4 (H) 0.1 - 1.0 K/uL   Eosinophils Absolute 0.3 0.0 - 0.7 K/uL   Basophils Absolute 0.5 (H) 0.0 - 0.1 K/uL   RBC Morphology TEARDROP CELLS    WBC Morphology MILD LEFT SHIFT (1-5% METAS, OCC MYELO, OCC BANDS)   Basic metabolic panel  Result Value Ref Range   Sodium 139 135 - 145 mmol/L   Potassium 4.9 3.5 - 5.1 mmol/L   Chloride 104 101 - 111 mmol/L   CO2 27 22 - 32 mmol/L   Glucose, Bld 100 (H) 65 - 99 mg/dL   BUN 61 (H) 6 - 20 mg/dL   Creatinine, Ser 1.30 (H) 0.61 - 1.24 mg/dL   Calcium 9.6 8.9 - 10.3 mg/dL   GFR calc non Af Amer 52 (L) >60 mL/min   GFR calc Af Amer >60 >60 mL/min   Anion gap 8 5 - 15    CLINICAL DATA:  It is evaluation for generalized dental pain with swelling under left eye.  EXAM: CT MAXILLOFACIAL WITH CONTRAST  TECHNIQUE: Multidetector CT imaging of the maxillofacial structures was performed with intravenous contrast. Multiplanar CT image reconstructions were also generated.  CONTRAST:  110mL OMNIPAQUE IOHEXOL 300 MG/ML  SOLN  COMPARISON:  None.  FINDINGS: Osseous: No acute osseous abnormality about the face. Prominent dental caries present at the left maxillary first and second bicuspids. Associated periapical lucency visualized bones of the neck are diffusely sclerotic in appearance without discrete lesion cervical  spondylolysis noted.  Orbits: Globes and orbital soft tissues within normal limits. No evidence for postseptal or intraorbital cellulitis.  Sinuses: Scattered mucosal thickening throughout the ethmoidal air cells and left greater than right maxillary sinuses. No air-fluid levels to suggest acute sinusitis. Trace right mastoid effusion noted. Middle ear cavities are clear. No abnormality at the nasopharynx.  Soft tissues: Focal soft tissue swelling present at the lateral left infraorbital soft tissues (series 3, image 62). Finding is nonspecific, and could reflect sequelae of recent trauma or possibly infection/cellulitis. No abscess or drainable fluid collection. No evidence for postseptal or intraorbital extension.  Additional asymmetric hazy soft tissue swelling seen throughout the left masticator space, suspicious for cellulitis. Mild overlying skin thickening an odontogenic source is suspected. No definite discrete odontogenic abscess identified.  Vascular calcifications noted within the visualized neck. No adenopathy. Salivary glands normal.  Limited intracranial: Unremarkable.  IMPRESSION: 1. Asymmetric soft tissue swelling with hazy inflammatory stranding within the left masticator space, suspicious for acute cellulitis. An odontogenic source is suspected with prominent dental caries and periapical lucency about the left first and second maxillary bicuspids. No discrete abscess or drainable fluid collection  at this time. 2. Superimposed more focal soft tissue swelling at the lateral left infraorbital soft tissues. Finding could also reflect sequelae of acute infection or possibly superimposed trauma/contusion. Correlation with history and physical exam recommended. No drainable collections within this region. No evidence for postseptal or intraorbital extension.   Electronically Signed   By: Jeannine Boga M.D.   On: 09/24/2017 00:34   Patient with  preseptal cellulitis. Will request admission for IV antibiotics.   Etta Quill, NP 48/27/07 [id number]    Delora Fuel, MD 44/92/01 [phone]

## 2017-09-24 NOTE — H&P (Signed)
History and Physical    Juan [name] JGG:[id number] DOB: 10/31/1940 DOA: 09/23/2017  PCP: Colon Branch, MD  Patient coming from: Home.  Chief Complaint: Left facial swelling.  HPI: Juan [name] is a 77 y.o. male with history of diabetes mellitus type 2, hypertension, chronic kidney disease, ascending aortic aneurysm, morbid obesity presents to the ER with complaints of worsening pain in the left facial area.  Patient has been having facial swelling and pain over the last 3 days.  Denies any trauma or insect bite.  Has been having some toothache involving most of the upper molars for last 2 months.  Denies any ER pain or ear discharge.  Denies any pain on moving the eyes.  No discharge from the eyes.  ED Course: CT maxillofacial done in the ER shows features concerning for left facial cellulitis with no definite discrete abscess.  Source could be odontogenic.  Patient started on empiric antibiotics and admitted for further management.  Review of Systems: As per HPI, rest all negative.   Past Medical History:  Diagnosis Date  . Allergic rhinitis   . Anemia   . Ascending aortic aneurysm ([place]) 03/2014   4.3cm on CT scan  . CAD (coronary artery disease)    dx elsewheer in past, no documentation. Non-ischemic myoview 2007  . Chronic diastolic CHF (congestive heart failure), NYHA class 2 (HCC)    Normal EF w/ grade 1 dd by echo 12/2015  . Chronic respiratory failure with hypoxia/2L at home    "2L; all the time" (05/03/2017)  . CKD (chronic kidney disease), stage III ([place])   . Edema    R>L leg, u/s 5-12 neg for DVT  . Hemorrhoid   . History of hydronephrosis   . History of Splenic infarct   . History of thrombocytosis   . Hypertension   . Iron deficiency anemia due to chronic blood loss 03/31/2017  . 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 Migraine    "once/wk at least" (07/11/2013)  . Moderate to severe pulmonary hypertension ([place])   . Myeloproliferative disease ([place])   . Pyelonephritis  04/29/2017  . Recurrent Pseudomonas urinary tract infection   . Shortness of breath   . Sinus congestion   . Sleep apnea, obstructive    at some point used CPAP, was d/c  years ago  . Type II diabetes mellitus ([place])   . UTI (urinary tract infection) 06/2017  . UTI (urinary tract infection) 07/2017    Past Surgical History:  Procedure Laterality Date  . FLEXIBLE SIGMOIDOSCOPY N/A 06/08/2017   Procedure: FLEXIBLE SIGMOIDOSCOPY;  Surgeon: Carol Ada, MD;  Location: [facility];  Service: Endoscopy;  Laterality: N/A;  . IR FLUORO GUIDE CV LINE RIGHT  05/07/2017  . IR FLUORO RM 30-60 MIN  05/07/2017  . IR US GUIDE VASC ACCESS RIGHT  05/07/2017  . TOE SURGERY Right    "tried to straighten out big toe" (07/11/2013)     reports that he quit smoking about 51 years ago. His smoking use included cigarettes. He has a 3.00 pack-year smoking history. He has never used smokeless tobacco. He reports that he does not drink alcohol or use drugs.  No Known Allergies  Family History  Problem Relation Age of Onset  . Schizophrenia Son   . Mental illness Daughter   . Colon cancer Neg Hx   . Prostate cancer Neg Hx   . Heart attack Neg Hx   . Diabetes Neg Hx     Prior to Admission medications  Medication Sig Start Date End Date Taking? Authorizing Provider  acetaminophen (TYLENOL) 325 MG tablet Take 2 tablets (650 mg total) by mouth every 6 (six) hours as needed for mild pain (or Fever >/= 101). 05/08/17   Rai, Ripudeep K, MD  amLODipine (NORVASC) 5 MG tablet Take 1 tablet (5 mg total) by mouth daily. For BP 09/13/17   Emokpae, Courage, MD  aspirin EC 81 MG tablet Take 81 mg by mouth daily.    [provider]  b complex vitamins tablet Take 1 tablet by mouth daily.    [provider]  clotrimazole (LOTRIMIN AF) 1 % cream Apply to Lt foot (between) Toes bid for athletes foot 09/12/17   Roxan Hockey, MD  ferrous gluconate (FERGON) 240 (27 FE) MG tablet Take 240 mg by mouth  daily.    [provider]  furosemide (LASIX) 40 MG tablet Take 80mg  (2tabs) in morning and 40mg  (1tab) in evening 09/12/17   Roxan Hockey, MD  hydrALAZINE (APRESOLINE) 50 MG tablet Take 1 tablet (50 mg total) by mouth 3 (three) times daily. 09/12/17   Roxan Hockey, MD  ondansetron (ZOFRAN) 4 MG tablet Take 1 tablet (4 mg total) by mouth every 6 (six) hours as needed for nausea or vomiting. 09/12/17   Roxan Hockey, MD  OXYGEN Inhale 2 L into the lungs continuous.    [provider]  tamsulosin (FLOMAX) 0.4 MG CAPS capsule Take 1 capsule (0.4 mg total) by mouth daily after supper. 06/13/17   Bonnielee Haff, MD    Physical Exam: Vitals:   09/24/17 0245 09/24/17 0315 09/24/17 0330 09/24/17 0351  BP: (!) 151/62 (!) 143/64 (!) 183/76 (!) 183/76  Pulse: 68 62 68 68  Resp:  14 18 18   Temp:    98.4 F (36.9 C)  TempSrc:    Oral  SpO2: 97% 98% 98% 98%      Constitutional: Moderately built and nourished. Vitals:   09/24/17 0245 09/24/17 0315 09/24/17 0330 09/24/17 0351  BP: (!) 151/62 (!) 143/64 (!) 183/76 (!) 183/76  Pulse: 68 62 68 68  Resp:  14 18 18   Temp:    98.4 F (36.9 C)  TempSrc:    Oral  SpO2: 97% 98% 98% 98%   Eyes: Anicteric no pallor.  Mild swelling in the left periorbital area patient has no restriction of eye movement or any problem with patient. ENMT: Left facial swelling involving the left maxillary area.  Infraorbital area is also swollen. Neck: No neck rigidity no mass felt. Respiratory: No rhonchi or crepitations. Cardiovascular: S1-S2 heard no murmurs appreciated. Abdomen: Soft nontender bowel sounds present. Musculoskeletal: No edema.  No joint effusion. Skin: Left facial swelling with mild erythema. Neurologic: Alert awake oriented to time place and person.  Moves all extremities. Psychiatric: Appears normal.  Normal affect.   Labs on Admission: I have personally reviewed following labs and imaging studies  CBC: Recent Labs    Lab Oct 08, 2017 0818 09/23/17 2103  WBC 13.3* 13.1*  NEUTROABS 8.3* 7.3  HGB 11.6* 10.8*  HCT 37.3* 34.9*  MCV 87.8 87.0  PLT 269 625   Basic Metabolic Panel: Recent Labs  Lab 2017-10-08 0818 09/23/17 2103  NA 140 139  K 4.6 4.9  CL 107 104  CO2 27 27  GLUCOSE 114 100*  BUN 62* 61*  CREATININE 1.21 1.30*  CALCIUM 10.2 9.6   GFR: CrCl cannot be calculated (Unknown ideal weight.). Liver Function Tests: Recent Labs  Lab 10-08-2017 0818  AST 26  ALT 23  ALKPHOS 132  BILITOT <0.2*  PROT 6.5  ALBUMIN 3.2*   No results for input(s): LIPASE, AMYLASE in the last 168 hours. No results for input(s): AMMONIA in the last 168 hours. Coagulation Profile: No results for input(s): INR, PROTIME in the last 168 hours. Cardiac Enzymes: No results for input(s): CKTOTAL, CKMB, CKMBINDEX, TROPONINI in the last 168 hours. BNP (last 3 results) No results for input(s): PROBNP in the last 8760 hours. HbA1C: No results for input(s): HGBA1C in the last 72 hours. CBG: No results for input(s): GLUCAP in the last 168 hours. Lipid Profile: No results for input(s): CHOL, HDL, LDLCALC, TRIG, CHOLHDL, LDLDIRECT in the last 72 hours. Thyroid Function Tests: No results for input(s): TSH, T4TOTAL, FREET4, T3FREE, THYROIDAB in the last 72 hours. Anemia Panel: Recent Labs    09/22/17 0818  FERRITIN 306  TIBC 245  IRON 47   Urine analysis:    Component Value Date/Time   COLORURINE YELLOW 09/05/2017 0108   APPEARANCEUR HAZY (A) 09/05/2017 0108   LABSPEC 1.010 09/05/2017 0108   PHURINE 7.0 09/05/2017 0108   GLUCOSEU NEGATIVE 09/05/2017 0108   GLUCOSEU NEGATIVE 05/02/2015 1008   HGBUR SMALL (A) 09/05/2017 0108   BILIRUBINUR NEGATIVE 09/05/2017 0108   KETONESUR NEGATIVE 09/05/2017 0108   PROTEINUR 100 (A) 09/05/2017 0108   UROBILINOGEN 0.2 05/02/2015 1008   NITRITE NEGATIVE 09/05/2017 0108   LEUKOCYTESUR LARGE (A) 09/05/2017 0108   Sepsis  Labs: @LABRCNTIP (procalcitonin:4,lacticidven:4) )No results found for this or any previous visit (from the past 240 hour(s)).   Radiological Exams on Admission: Ct Maxillofacial W Contrast  Result Date: 09/24/2017 CLINICAL DATA:  It is evaluation for generalized dental pain with swelling under left eye. EXAM: CT MAXILLOFACIAL WITH CONTRAST TECHNIQUE: Multidetector CT imaging of the maxillofacial structures was performed with intravenous contrast. Multiplanar CT image reconstructions were also generated. CONTRAST:  126mL OMNIPAQUE IOHEXOL 300 MG/ML  SOLN COMPARISON:  None. FINDINGS: Osseous: No acute osseous abnormality about the face. Prominent dental caries present at the left maxillary first and second bicuspids. Associated periapical lucency visualized bones of the neck are diffusely sclerotic in appearance without discrete lesion cervical spondylolysis noted. Orbits: Globes and orbital soft tissues within normal limits. No evidence for postseptal or intraorbital cellulitis. Sinuses: Scattered mucosal thickening throughout the ethmoidal air cells and left greater than right maxillary sinuses. No air-fluid levels to suggest acute sinusitis. Trace right mastoid effusion noted. Middle ear cavities are clear. No abnormality at the nasopharynx. Soft tissues: Focal soft tissue swelling present at the lateral left infraorbital soft tissues (series 3, image 62). Finding is nonspecific, and could reflect sequelae of recent trauma or possibly infection/cellulitis. No abscess or drainable fluid collection. No evidence for postseptal or intraorbital extension. Additional asymmetric hazy soft tissue swelling seen throughout the left masticator space, suspicious for cellulitis. Mild overlying skin thickening an odontogenic source is suspected. No definite discrete odontogenic abscess identified. Vascular calcifications noted within the visualized neck. No adenopathy. Salivary glands normal. Limited intracranial:  Unremarkable. IMPRESSION: 1. Asymmetric soft tissue swelling with hazy inflammatory stranding within the left masticator space, suspicious for acute cellulitis. An odontogenic source is suspected with prominent dental caries and periapical lucency about the left first and second maxillary bicuspids. No discrete abscess or drainable fluid collection at this time. 2. Superimposed more focal soft tissue swelling at the lateral left infraorbital soft tissues. Finding could also reflect sequelae of acute infection or possibly superimposed trauma/contusion. Correlation with history and physical exam recommended. No drainable collections  within this region. No evidence for postseptal or intraorbital extension. Electronically Signed   By: Jeannine Boga M.D.   On: 09/24/2017 00:34      Assessment/Plan Principal Problem:   Facial cellulitis Active Problems:   Myeloproliferative disease ([place])   Obstructive sleep apnea   Pulmonary hypertension severe   Sleep apnea, obstructive   Chronic diastolic CHF (congestive heart failure), NYHA class 2 ([place])   ARF (acute renal failure) ([place])    1. Facial cellulitis involving the left side of the face -patient placed on Vanco and Zosyn.  Follow cultures.  No definite abscess seen.  Could be odontogenic in origin may discuss with oral surgeon Dr. Hoyt Koch in a.m. 2. Chronic diastolic CHF last EF measured in May 2018 was 55 to 60% with grade 1 diastolic dysfunction.  Since creatinine is mildly elevated and patient looks medically dry I have decreased Lasix dose from 80 mg in the morning to 40 and will continue the evening dose of 40. 3. Hypertension uncontrolled -in addition to patient's amlodipine and hydralazine I have also place patient on PRN IV hydralazine. 4. Chronic kidney disease stage III with mild worsening of creatinine.  As discussed #2 Lasix mildly decreased. 5. History of sleep apnea intolerant to CPAP.  Now also patient has facial swelling. 6. History  of myeloproliferative disease with leukocytosis and anemia.  Being followed by oncologist Dr. Burney Gauze. 7. History of diabetes per the chart.  Patient's last hemoglobin A1c last month was 5.1 and patient is not on any antidiabetic medications. 8. Recent admission for UTI and bladder outlet obstruction has Foley catheter placed is supposed to follow-up with Dr. Diona Fanti, urologist next week.   DVT prophylaxis: SCDs for now.  If cellulitis does not get worsen and no procedures anticipated change to Lovenox. Code Status: Full code. Family Communication: Discussed with patient. Disposition Plan: Home. Consults called: None. Admission status: Inpatient.   Rise Patience MD [facility] Pager [phone].  If 7PM-7AM, please contact night-coverage [email or web address] Password [email or web address]  09/24/2017, 3:55 AM

## 2017-09-24 NOTE — Progress Notes (Addendum)
PROGRESS NOTE    Juan [name]  YQM:[id number] DOB: [birth year] DOA: 09/23/2017 PCP: Colon Branch, MD   Brief Narrative: Patient is a 77 year old male with past medical history of diabetes mellitus type 2, hypertension, CKD, ascending aortic aneurysm, morbid obesity who presented to the emergency department with complaints of worsening pain and swelling of his left facial area for 3 days.  He denies any insect bite or trauma.  He was having some toothache involving most of the upper molars for the last 2 months.  Denies any ear pain or ear discharge.  No pain on moving the eyes or discharge from the eyes.  CT maxillofacial was done in the emergency department which was concerning for left facial cellulitis with no discrete abscess.  Probably from the odontogenic source.  Patient was started on empiric antibiotics.  Assessment & Plan:   Principal Problem:   Facial cellulitis Active Problems:   Myeloproliferative disease ([place])   Obstructive sleep apnea   Pulmonary hypertension severe   Sleep apnea, obstructive   Chronic diastolic CHF (congestive heart failure), NYHA class 2 (HCC)   ARF (acute renal failure) (H[facility])  Facial cellulitis: Involving left side of the face.  Started on broad-spectrum antibiotics with vancomycin and Zosyn.  Will follow blood cultures.  Abscess seen in the CT scan.  Patient is afebrile. This could be from odontogenic source.  Tried to contact oral surgeon Dr. Hoyt Koch but office is closed for today.  Chronic diastolic CHF: Last ejection fraction measured in May 2018 was 55 to 60% with grade 1 diastolic dysfunction.  Creatinine was mildly elevated on presentation.  Patient looks dry on presentation.  He was taking Lasix 80 mg in the morning and 40 mg at night.  Currently on 40 mg twice a day.  Hypertension: We will continue to monitor his blood pressure.  Continue his home medication and as needed IV meds.  Chronic kidney disease stage III: Presented with mild worsening of  creatinine.   Kidney function normal this morning.  History of sleep apnea: Intolerant of CPAP.  Also has facial swelling now.  History of myeloproliferative disease with leukocytosis and anemia: Follows with oncology Dr. Marin Olp.  History of diabetes: Last hemoglobin A1c was 5.1.  Not on any antidiabetic medications.  Recent admission for UTI and bladder outlet obstruction: Foley catheter in place.  He needs to follow-up with Dr.Dr. Diona Fanti, urology next week.    DVT prophylaxis: SCD Code Status: Full Family Communication: None present at the bedside Disposition Plan: Home after resolution of facial cellulitis   Consultants: None  Procedures: None  Antimicrobials: Vancomycin and Zosyn since 09/24/2017  Subjective: Patient seen and examined at bedside this morning.  Complains of pain on the left side of the face and gum on  the left upper side.  Objective: Vitals:   09/24/17 0419 09/24/17 0500 09/24/17 0536 09/24/17 0736  BP: (!) 162/72   (!) 142/60  Pulse: 64   66  Resp: (!) 22   20  Temp: 98.2 F (36.8 C)   98.6 F (37 C)  TempSrc: Oral   Oral  SpO2: 98%   94%  Weight:  82.8 kg (182 lb 8.7 oz)    Height:   6\' 3"  (1.905 m)     Intake/Output Summary (Last 24 hours) at 09/24/2017 1504 Last data filed at 09/24/2017 1330 Gross per 24 hour  Intake 1188 ml  Output 3375 ml  Net -2187 ml   Filed Weights   09/24/17 0500  Weight:  82.8 kg (182 lb 8.7 oz)    Examination:  General exam: Appears calm and comfortable ,Not in distress,average built HEENT:PERRL,Oral mucosa moist, Ear/Nose normal on gross exam, swelling of the left face and left periorbital area Respiratory system: Bilateral equal air entry, normal vesicular breath sounds, no wheezes or crackles  Cardiovascular system: S1 & S2 heard, RRR. No JVD, murmurs, rubs, gallops or clicks. No pedal edema. Gastrointestinal system: Abdomen is nondistended, soft and nontender. No organomegaly or masses felt. Normal bowel  sounds heard. Central nervous system: Alert and oriented. No focal neurological deficits. Extremities: No edema, no clubbing ,no cyanosis, distal peripheral pulses palpable. Skin: No rashes, lesions or ulcers,no icterus ,no pallor MSK: Normal muscle bulk,tone ,power Psychiatry: Judgement and insight appear normal. Mood & affect appropriate.     Data Reviewed: I have personally reviewed following labs and imaging studies  CBC: Recent Labs  Lab 09/22/17 0818 09/23/17 2103 09/24/17 0605  WBC 13.3* 13.1* 12.3*  NEUTROABS 8.3* 7.3  --   HGB 11.6* 10.8* 9.9*  HCT 37.3* 34.9* 32.9*  MCV 87.8 87.0 85.9  PLT 269 246 062   Basic Metabolic Panel: Recent Labs  Lab 09/22/17 0818 09/23/17 2103 09/24/17 0605  NA 140 139 142  K 4.6 4.9 4.3  CL 107 104 108  CO2 27 27 25   GLUCOSE 114 100* 123*  BUN 62* 61* 57*  CREATININE 1.21 1.30* 1.12  CALCIUM 10.2 9.6 9.6   GFR: Estimated Creatinine Clearance: 66.7 mL/min (by C-G formula based on SCr of 1.12 mg/dL). Liver Function Tests: Recent Labs  Lab 09/22/17 0818  AST 26  ALT 23  ALKPHOS 132  BILITOT <0.2*  PROT 6.5  ALBUMIN 3.2*   No results for input(s): LIPASE, AMYLASE in the last 168 hours. No results for input(s): AMMONIA in the last 168 hours. Coagulation Profile: No results for input(s): INR, PROTIME in the last 168 hours. Cardiac Enzymes: No results for input(s): CKTOTAL, CKMB, CKMBINDEX, TROPONINI in the last 168 hours. BNP (last 3 results) No results for input(s): PROBNP in the last 8760 hours. HbA1C: No results for input(s): HGBA1C in the last 72 hours. CBG: Recent Labs  Lab 09/24/17 0730  GLUCAP 92   Lipid Profile: No results for input(s): CHOL, HDL, LDLCALC, TRIG, CHOLHDL, LDLDIRECT in the last 72 hours. Thyroid Function Tests: No results for input(s): TSH, T4TOTAL, FREET4, T3FREE, THYROIDAB in the last 72 hours. Anemia Panel: Recent Labs    09/22/17 0818  FERRITIN 306  TIBC 245  IRON 47   Sepsis  Labs: No results for input(s): PROCALCITON, LATICACIDVEN in the last 168 hours.  No results found for this or any previous visit (from the past 240 hour(s)).       Radiology Studies: Ct Maxillofacial W Contrast  Result Date: 09/24/2017 CLINICAL DATA:  It is evaluation for generalized dental pain with swelling under left eye. EXAM: CT MAXILLOFACIAL WITH CONTRAST TECHNIQUE: Multidetector CT imaging of the maxillofacial structures was performed with intravenous contrast. Multiplanar CT image reconstructions were also generated. CONTRAST:  164mL OMNIPAQUE IOHEXOL 300 MG/ML  SOLN COMPARISON:  None. FINDINGS: Osseous: No acute osseous abnormality about the face. Prominent dental caries present at the left maxillary first and second bicuspids. Associated periapical lucency visualized bones of the neck are diffusely sclerotic in appearance without discrete lesion cervical spondylolysis noted. Orbits: Globes and orbital soft tissues within normal limits. No evidence for postseptal or intraorbital cellulitis. Sinuses: Scattered mucosal thickening throughout the ethmoidal air cells and left greater than right  maxillary sinuses. No air-fluid levels to suggest acute sinusitis. Trace right mastoid effusion noted. Middle ear cavities are clear. No abnormality at the nasopharynx. Soft tissues: Focal soft tissue swelling present at the lateral left infraorbital soft tissues (series 3, image 62). Finding is nonspecific, and could reflect sequelae of recent trauma or possibly infection/cellulitis. No abscess or drainable fluid collection. No evidence for postseptal or intraorbital extension. Additional asymmetric hazy soft tissue swelling seen throughout the left masticator space, suspicious for cellulitis. Mild overlying skin thickening an odontogenic source is suspected. No definite discrete odontogenic abscess identified. Vascular calcifications noted within the visualized neck. No adenopathy. Salivary glands normal.  Limited intracranial: Unremarkable. IMPRESSION: 1. Asymmetric soft tissue swelling with hazy inflammatory stranding within the left masticator space, suspicious for acute cellulitis. An odontogenic source is suspected with prominent dental caries and periapical lucency about the left first and second maxillary bicuspids. No discrete abscess or drainable fluid collection at this time. 2. Superimposed more focal soft tissue swelling at the lateral left infraorbital soft tissues. Finding could also reflect sequelae of acute infection or possibly superimposed trauma/contusion. Correlation with history and physical exam recommended. No drainable collections within this region. No evidence for postseptal or intraorbital extension. Electronically Signed   By: Jeannine Boga M.D.   On: 09/24/2017 00:34        Scheduled Meds: . amLODipine  5 mg Oral Daily  . aspirin EC  81 mg Oral Daily  . B-complex with vitamin C  1 tablet Oral Daily  . ferrous gluconate  324 mg Oral Q breakfast  . furosemide  40 mg Oral BID  . hydrALAZINE  50 mg Oral Q8H  . mouth rinse  15 mL Mouth Rinse BID  . tamsulosin  0.4 mg Oral QPC supper   Continuous Infusions: . piperacillin-tazobactam (ZOSYN)  IV 3.375 g (09/24/17 1324)  . vancomycin 750 mg (09/24/17 0501)     LOS: 0 days    Time spent: 25 mins.More than 50% of that time was spent in counseling and/or coordination of care.      Shelly Coss, MD [facility] Pager [phone]  If 7PM-7AM, please contact night-coverage [email or web address] Password [email or web address] 09/24/2017, 3:04 PM

## 2017-09-24 NOTE — Progress Notes (Signed)
Pharmacy Antibiotic Note  Xai Frerking is a 77 y.o. male admitted on 09/23/2017 with cellulitis.  Pharmacy has been consulted for Vancomycin/Zosyn dosing. No abscess on CT. WBC 13.1. Mild bump in Scr.   Plan: Vancomycin 750 mg IV q12h Zosyn 3.375G IV q8h to be infused over 4 hours Trend WBC, temp, renal function  F/U infectious work-up Drug levels as indicated  Temp (24hrs), Avg:98.3 F (36.8 C), Min:98.2 F (36.8 C), Max:98.4 F (36.9 C)  Recent Labs  Lab 09/22/17 0818 09/23/17 2103  WBC 13.3* 13.1*  CREATININE 1.21 1.30*    CrCl cannot be calculated (Unknown ideal weight.).    No Known Allergies   Eliel, [place] 09/24/2017 4:34 AM

## 2017-09-24 NOTE — Progress Notes (Addendum)
Pt foley present on admission for history of bladder outlet obstruction. Date on foley bag is 4/3. Per protocol foley should be changed and this was explained to patient. Patient request for nursing not change his foley as he has appointment on Monday 5/13 with urologist to change. MD paged for his recommendations.   1541: per Dr. Tawanna Solo, ok to wait until Monday to change foley if patient still hospitalized.

## 2017-09-24 NOTE — Progress Notes (Signed)
Nutrition Brief Note  Patient identified on the Malnutrition Screening Tool (MST) Report.  Wt Readings from Last 15 Encounters:  09/24/17 182 lb 8.7 oz (82.8 kg)  09/09/17 189 lb 2.5 oz (85.8 kg)  09/03/17 193 lb (87.5 kg)  08/23/17 179 lb 3.2 oz (81.3 kg)  08/04/17 178 lb 12.7 oz (81.1 kg)  07/16/17 190 lb 11.2 oz (86.5 kg)  07/10/17 176 lb 5.9 oz (80 kg)  06/30/17 203 lb (92.1 kg)  06/10/17 207 lb 3.7 oz (94 kg)  05/14/17 184 lb (83.5 kg)  05/13/17 184 lb (83.5 kg)  04/29/17 184 lb (83.5 kg)  04/12/17 174 lb (78.9 kg)  03/30/17 174 lb (78.9 kg)  03/08/17 174 lb (78.9 kg)   Body mass index is 22.82 kg/m. Patient meets criteria for Normal based on current BMI.   Current diet order is Heart Healthy, patient is consuming approximately 100% of meals at this time. Labs and medications reviewed.   No nutrition interventions warranted at this time. If nutrition issues arise, please consult RD.   Arthur Holms, RD, LDN Pager #: [phone] After-Hours Pager #: [phone]

## 2017-09-25 LAB — CBC WITH DIFFERENTIAL/PLATELET
BASOS ABS: 0.4 10*3/uL — AB (ref 0.0–0.1)
Basophils Relative: 3 %
EOS PCT: 2 %
Eosinophils Absolute: 0.3 10*3/uL (ref 0.0–0.7)
HEMATOCRIT: 32.2 % — AB (ref 39.0–52.0)
Hemoglobin: 9.7 g/dL — ABNORMAL LOW (ref 13.0–17.0)
LYMPHS ABS: 2.5 10*3/uL (ref 0.7–4.0)
Lymphocytes Relative: 20 %
MCH: 25.7 pg — ABNORMAL LOW (ref 26.0–34.0)
MCHC: 30.1 g/dL (ref 30.0–36.0)
MCV: 85.4 fL (ref 78.0–100.0)
MONO ABS: 2.4 10*3/uL — AB (ref 0.1–1.0)
Monocytes Relative: 19 %
Neutro Abs: 7 10*3/uL (ref 1.7–7.7)
Neutrophils Relative %: 56 %
PLATELETS: 253 10*3/uL (ref 150–400)
RBC: 3.77 MIL/uL — AB (ref 4.22–5.81)
RDW: 17.8 % — AB (ref 11.5–15.5)
WBC: 12.6 10*3/uL — AB (ref 4.0–10.5)

## 2017-09-25 LAB — GLUCOSE, CAPILLARY: GLUCOSE-CAPILLARY: 103 mg/dL — AB (ref 65–99)

## 2017-09-25 MED ORDER — VANCOMYCIN HCL IN DEXTROSE 1-5 GM/200ML-% IV SOLN
1000.0000 mg | Freq: Two times a day (BID) | INTRAVENOUS | Status: DC
  Administered 2017-09-25 – 2017-09-26 (×2): 1000 mg via INTRAVENOUS
  Filled 2017-09-25 (×3): qty 200

## 2017-09-25 NOTE — Progress Notes (Signed)
PROGRESS NOTE    Juan [name]  HUD:[id number] DOB: 1940/11/15 DOA: 09/23/2017 PCP: Colon Branch, MD   Brief Narrative: Patient is a 77 year old male with past medical history of diabetes mellitus type 2, hypertension, CKD, ascending aortic aneurysm, morbid obesity who presented to the emergency department with complaints of worsening pain and swelling of his left facial area for 3 days.  He denies any insect bite or trauma.  He was having some toothache involving most of the upper molars for the last 2 months.  Denies any ear pain or ear discharge.  No pain on moving the eyes or discharge from the eyes.  CT maxillofacial was done in the emergency department which was concerning for left facial cellulitis with no discrete abscess.  Probably from the odontogenic source.  Patient was started on empiric antibiotics.  Assessment & Plan:   Principal Problem:   Facial cellulitis Active Problems:   Myeloproliferative disease ([place])   Obstructive sleep apnea   Pulmonary hypertension severe   Sleep apnea, obstructive   Chronic diastolic CHF (congestive heart failure), NYHA class 2 (HCC)   ARF (acute renal failure) (HCC)  Facial cellulitis: Involving left side of the face.  Started on broad-spectrum antibiotics with vancomycin and Zosyn.    Abscess not  seen in the CT scan.  Patient is afebrile. This could be from odontogenic source.  Tried to contact oral surgeon Dr. Hoyt Koch but office is closed for today. Facial swelling and pain has improved today with antibiotics.  Chronic diastolic CHF: Last ejection fraction measured in May 2018 was 55 to 60% with grade 1 diastolic dysfunction.  Creatinine was mildly elevated on presentation.  Patient looks dry on presentation.  He was taking Lasix 80 mg in the morning and 40 mg at night.  Currently on 40 mg twice a day.  Hypertension: We will continue to monitor his blood pressure.  Continue his home medication and as needed IV meds.  Chronic kidney disease stage  III: Presented with mild worsening of creatinine.   Kidney function normalised.  History of sleep apnea: Intolerant of CPAP.  Also has facial swelling now.  History of myeloproliferative disease with leukocytosis and anemia: Follows with oncology Dr. Marin Olp.  History of diabetes: Last hemoglobin A1c was 5.1.  Not on any antidiabetic medications.  Recent admission for UTI and bladder outlet obstruction: Foley catheter in place.  He needs to follow-up with Dr.Dr. Diona Fanti, urology next week.Denied foley exchange here.    DVT prophylaxis: SCD Code Status: Full Family Communication: None present at the bedside Disposition Plan: Home after resolution of facial cellulitis likely tomorrow   Consultants: None  Procedures: None  Antimicrobials: Vancomycin and Zosyn since 09/24/2017  Subjective: Patient seen and examined the bedside this morning.  Left-sided facial pain and swelling has improved with antibiotics.  Objective: Vitals:   09/24/17 0736 09/24/17 1737 09/24/17 2330 09/25/17 0727  BP: (!) 142/60 (!) 166/72 (!) 152/82 (!) 144/66  Pulse: 66 76 80 70  Resp: 20  20 20   Temp: 98.6 F (37 C) 98.9 F (37.2 C) 98.8 F (37.1 C) 98.3 F (36.8 C)  TempSrc: Oral Oral Oral Oral  SpO2: 94% 98% 94% 96%  Weight:      Height:        Intake/Output Summary (Last 24 hours) at 09/25/2017 1402 Last data filed at 09/25/2017 0730 Gross per 24 hour  Intake -  Output 1550 ml  Net -1550 ml   Filed Weights   09/24/17 0500  Weight:  82.8 kg (182 lb 8.7 oz)    Examination:  General exam: Appears calm and comfortable ,Not in distress,average built HEENT:PERRL,Oral mucosa moist, Ear/Nose normal on gross exam,swelling of the left face and left periorbital area Respiratory system: Bilateral equal air entry, normal vesicular breath sounds, no wheezes or crackles  Cardiovascular system: S1 & S2 heard, RRR. No JVD, murmurs, rubs, gallops or clicks. Gastrointestinal system: Abdomen is  nondistended, soft and nontender. No organomegaly or masses felt. Normal bowel sounds heard. Central nervous system: Alert and oriented. No focal neurological deficits. Extremities: No edema, no clubbing ,no cyanosis, distal peripheral pulses palpable. Skin: No rashes, lesions or ulcers,no icterus ,no pallor Psychiatry: Judgement and insight appear normal. Mood & affect appropriate.      Data Reviewed: I have personally reviewed following labs and imaging studies  CBC: Recent Labs  Lab 09/22/17 0818 09/23/17 2103 09/24/17 0605 09/25/17 0233  WBC 13.3* 13.1* 12.3* 12.6*  NEUTROABS 8.3* 7.3  --  7.0  HGB 11.6* 10.8* 9.9* 9.7*  HCT 37.3* 34.9* 32.9* 32.2*  MCV 87.8 87.0 85.9 85.4  PLT 269 246 242 941   Basic Metabolic Panel: Recent Labs  Lab 09/22/17 0818 09/23/17 2103 09/24/17 0605  NA 140 139 142  K 4.6 4.9 4.3  CL 107 104 108  CO2 27 27 25   GLUCOSE 114 100* 123*  BUN 62* 61* 57*  CREATININE 1.21 1.30* 1.12  CALCIUM 10.2 9.6 9.6   GFR: Estimated Creatinine Clearance: 66.7 mL/min (by C-G formula based on SCr of 1.12 mg/dL). Liver Function Tests: Recent Labs  Lab 09/22/17 0818  AST 26  ALT 23  ALKPHOS 132  BILITOT <0.2*  PROT 6.5  ALBUMIN 3.2*   No results for input(s): LIPASE, AMYLASE in the last 168 hours. No results for input(s): AMMONIA in the last 168 hours. Coagulation Profile: No results for input(s): INR, PROTIME in the last 168 hours. Cardiac Enzymes: No results for input(s): CKTOTAL, CKMB, CKMBINDEX, TROPONINI in the last 168 hours. BNP (last 3 results) No results for input(s): PROBNP in the last 8760 hours. HbA1C: No results for input(s): HGBA1C in the last 72 hours. CBG: Recent Labs  Lab 09/24/17 0730 09/25/17 0728  GLUCAP 92 103*   Lipid Profile: No results for input(s): CHOL, HDL, LDLCALC, TRIG, CHOLHDL, LDLDIRECT in the last 72 hours. Thyroid Function Tests: No results for input(s): TSH, T4TOTAL, FREET4, T3FREE, THYROIDAB in the  last 72 hours. Anemia Panel: No results for input(s): VITAMINB12, FOLATE, FERRITIN, TIBC, IRON, RETICCTPCT in the last 72 hours. Sepsis Labs: No results for input(s): PROCALCITON, LATICACIDVEN in the last 168 hours.  No results found for this or any previous visit (from the past 240 hour(s)).       Radiology Studies: Ct Maxillofacial W Contrast  Result Date: 09/24/2017 CLINICAL DATA:  It is evaluation for generalized dental pain with swelling under left eye. EXAM: CT MAXILLOFACIAL WITH CONTRAST TECHNIQUE: Multidetector CT imaging of the maxillofacial structures was performed with intravenous contrast. Multiplanar CT image reconstructions were also generated. CONTRAST:  175mL OMNIPAQUE IOHEXOL 300 MG/ML  SOLN COMPARISON:  None. FINDINGS: Osseous: No acute osseous abnormality about the face. Prominent dental caries present at the left maxillary first and second bicuspids. Associated periapical lucency visualized bones of the neck are diffusely sclerotic in appearance without discrete lesion cervical spondylolysis noted. Orbits: Globes and orbital soft tissues within normal limits. No evidence for postseptal or intraorbital cellulitis. Sinuses: Scattered mucosal thickening throughout the ethmoidal air cells and left greater than right  maxillary sinuses. No air-fluid levels to suggest acute sinusitis. Trace right mastoid effusion noted. Middle ear cavities are clear. No abnormality at the nasopharynx. Soft tissues: Focal soft tissue swelling present at the lateral left infraorbital soft tissues (series 3, image 62). Finding is nonspecific, and could reflect sequelae of recent trauma or possibly infection/cellulitis. No abscess or drainable fluid collection. No evidence for postseptal or intraorbital extension. Additional asymmetric hazy soft tissue swelling seen throughout the left masticator space, suspicious for cellulitis. Mild overlying skin thickening an odontogenic source is suspected. No definite  discrete odontogenic abscess identified. Vascular calcifications noted within the visualized neck. No adenopathy. Salivary glands normal. Limited intracranial: Unremarkable. IMPRESSION: 1. Asymmetric soft tissue swelling with hazy inflammatory stranding within the left masticator space, suspicious for acute cellulitis. An odontogenic source is suspected with prominent dental caries and periapical lucency about the left first and second maxillary bicuspids. No discrete abscess or drainable fluid collection at this time. 2. Superimposed more focal soft tissue swelling at the lateral left infraorbital soft tissues. Finding could also reflect sequelae of acute infection or possibly superimposed trauma/contusion. Correlation with history and physical exam recommended. No drainable collections within this region. No evidence for postseptal or intraorbital extension. Electronically Signed   By: Jeannine Boga M.D.   On: 09/24/2017 00:34        Scheduled Meds: . amLODipine  5 mg Oral Daily  . aspirin EC  81 mg Oral Daily  . B-complex with vitamin C  1 tablet Oral Daily  . ferrous gluconate  324 mg Oral Q breakfast  . furosemide  40 mg Oral BID  . hydrALAZINE  50 mg Oral Q8H  . mouth rinse  15 mL Mouth Rinse BID  . nystatin-triamcinolone   Topical BID  . tamsulosin  0.4 mg Oral QPC supper   Continuous Infusions: . piperacillin-tazobactam (ZOSYN)  IV Stopped (09/25/17 0930)  . vancomycin       LOS: 1 day    Time spent: 25 mins.More than 50% of that time was spent in counseling and/or coordination of care.      Shelly Coss, MD [facility] Pager [phone]  If 7PM-7AM, please contact night-coverage [email or web address] Password [email or web address] 09/25/2017, 2:02 PM

## 2017-09-25 NOTE — Progress Notes (Signed)
Pharmacy Antibiotic Note  Juan [name] is a 77 y.o. male admitted on 09/23/2017 with cellulitis.  Pharmacy has been consulted for Vancomycin/Zosyn dosing. No abscess on CT. WBC 12.6. Mild bump in Scr. Now Scr down to 1.12  Plan: Increase Vancomycin to 1000 mg IV q12h Zosyn 3.375G IV q8h to be infused over 4 hours Trend WBC, temp, renal function  F/U infectious work-up Drug levels as indicated  Temp (24hrs), Avg:98.7 F (37.1 C), Min:98.3 F (36.8 C), Max:98.9 F (37.2 C)  Recent Labs  Lab 09/22/17 0818 09/23/17 2103 09/24/17 0605 09/25/17 0233  WBC 13.3* 13.1* 12.3* 12.6*  CREATININE 1.21 1.30* 1.12  --     Estimated Creatinine Clearance: 66.7 mL/min (by C-G formula based on SCr of 1.12 mg/dL).    No Known Allergies   Jaylene Schrom A Verdella Laidlaw 09/25/2017 10:25 AM

## 2017-09-26 LAB — CBC WITH DIFFERENTIAL/PLATELET
BASOS ABS: 0.6 10*3/uL — AB (ref 0.0–0.1)
Basophils Relative: 4 %
EOS ABS: 0.3 10*3/uL (ref 0.0–0.7)
EOS PCT: 2 %
HEMATOCRIT: 34 % — AB (ref 39.0–52.0)
Hemoglobin: 10.3 g/dL — ABNORMAL LOW (ref 13.0–17.0)
LYMPHS ABS: 2.1 10*3/uL (ref 0.7–4.0)
Lymphocytes Relative: 15 %
MCH: 26.1 pg (ref 26.0–34.0)
MCHC: 30.3 g/dL (ref 30.0–36.0)
MCV: 86.1 fL (ref 78.0–100.0)
MONO ABS: 2.9 10*3/uL — AB (ref 0.1–1.0)
Monocytes Relative: 20 %
NEUTROS PCT: 59 %
Neutro Abs: 8.4 10*3/uL — ABNORMAL HIGH (ref 1.7–7.7)
PLATELETS: 259 10*3/uL (ref 150–400)
RBC: 3.95 MIL/uL — AB (ref 4.22–5.81)
RDW: 18 % — AB (ref 11.5–15.5)
WBC: 14.3 10*3/uL — AB (ref 4.0–10.5)

## 2017-09-26 MED ORDER — AMOXICILLIN-POT CLAVULANATE 875-125 MG PO TABS: 1.0000 | ORAL_TABLET | Freq: Two times a day (BID) | ORAL | 0 refills | Status: DC

## 2017-09-26 MED ORDER — OXYCODONE-ACETAMINOPHEN 5-325 MG PO TABS: 1.0000 | ORAL_TABLET | ORAL | 0 refills | Status: AC | PRN

## 2017-09-26 MED ORDER — LIDOCAINE 5 % EX PTCH
1.0000 | MEDICATED_PATCH | CUTANEOUS | Status: DC
  Administered 2017-09-26: 1 via TRANSDERMAL
  Filled 2017-09-26: qty 1

## 2017-09-26 MED ORDER — FUROSEMIDE 40 MG PO TABS: ORAL_TABLET | ORAL | 0 refills | Status: DC

## 2017-09-26 MED ORDER — AMOXICILLIN-POT CLAVULANATE 875-125 MG PO TABS
1.0000 | ORAL_TABLET | Freq: Two times a day (BID) | ORAL | 0 refills | Status: DC
Start: 2017-09-26 — End: 2017-09-26

## 2017-09-26 MED ORDER — ORAL CARE MOUTH RINSE: 15.0000 mL | Freq: Two times a day (BID) | OROMUCOSAL | 0 refills | Status: DC

## 2017-09-26 MED ORDER — AMOXICILLIN-POT CLAVULANATE 875-125 MG PO TABS
1.0000 | ORAL_TABLET | Freq: Two times a day (BID) | ORAL | Status: DC
  Administered 2017-09-26: 1 via ORAL
  Filled 2017-09-26: qty 1

## 2017-09-26 MED ORDER — ORAL CARE MOUTH RINSE: 15.0000 mL | Freq: Two times a day (BID) | OROMUCOSAL | 0 refills | Status: AC

## 2017-09-26 NOTE — Progress Notes (Signed)
CSW contacted by RN that patient is saying that he can't afford his medications. CSW checked in with RNCM to discuss options; patient does not qualify for [facility] letter and has insurance coverage for medications. CSW printed coupon for patient's antibiotic to be filled at his [facility]; reduced the price of the antibiotic to $35 (down from estimated $65).  Laveda Abbe, [place] Clinical Social Worker [phone]

## 2017-09-26 NOTE — Progress Notes (Signed)
RN assumed care of pt at 2300. Pt sitting up in bed eating a microwaveable meal on initial assessment. Pt requested RN to put moisturizer on the left side of his face where he still has some swelling once he is finished eating. Pt in NAD at this time. Will continue to monitor. Clint Bolder, RN 09/26/17 12:24 AM

## 2017-09-26 NOTE — Discharge Summary (Addendum)
Physician Discharge Summary  Juan [name] UMP:[id number] DOB: 13-Sep-1940 DOA: 09/23/2017  PCP: Colon Branch, MD  Admit date: 09/23/2017 Discharge date: 09/26/2017  Admitted From: Home Disposition:  Home  Discharge Condition:Stable CODE STATUS:FULL Diet recommendation: Heart Healthy  Brief/Interim Summary:  Patient is a 77 year old male with past medical history of diabetes mellitus type 2, hypertension, CKD, ascending aortic aneurysm, morbid obesity who presented to the emergency department with complaints of worsening pain and swelling of his left facial area for 3 days.  He denied any insect bite or trauma.  He was having some toothache involving most of the upper molars for the last 2 months.  Denied any ear pain or ear discharge.  No pain on moving the eyes or discharge from the eyes.  CT maxillofacial was done in the emergency department which was concerning for left facial cellulitis with no discrete abscess.  It was probably from the odontogenic source.  Patient was started on empiric antibiotics. Left facial swelling and pain along with dental pain has improved with  IV antibiotics.  Patient remained afebrile.  He has mild leukocytosis.  Antibiotics changed to oral today.  He is stable for discharge to home today.  He needs to follow-up with dental/oral surgery as an outpatient as soon as possible.    Following problems were addressed during his hospitalization:  Facial cellulitis: Involving left side of the face.  Started on broad-spectrum antibiotics with vancomycin and Zosyn.    Abscess not  seen in the CT scan.  Patient is afebrile. This could be from odontogenic source.  Tried to contact oral surgeon Dr. Hoyt Koch but office was  closed from Friday to Sunday. Facial swelling and pain has improved today with antibiotics. He will follow-up with Dr. Hoyt Koch as an outpatient.  Chronic diastolic CHF: Last ejection fraction measured in May 2018 was 55 to 60% with grade 1 diastolic dysfunction.   Creatinine was mildly elevated on presentation.  Patient looks dry on presentation.  He was taking Lasix 80 mg in the morning and 40 mg at night. Continue on 40 mg twice a day.  Hypertension:   Continue his home medications. BP stable  Chronic kidney disease stage III: Presented with mild worsening of creatinine.   Kidney function normalised.  History of sleep apnea: Intolerant of CPAP.    History of myeloproliferative disease with leukocytosis and anemia: Follows with oncology Dr. Marin Olp.  History of diabetes: Last hemoglobin A1c was 5.1.  Not on any antidiabetic medications.  Recent admission for UTI and bladder outlet obstruction: Foley catheter in place.  He needs to follow-up with Dr. Diona Fanti, urology next week.Denied foley exchange here.    Discharge Diagnoses:  Principal Problem:   Facial cellulitis Active Problems:   Myeloproliferative disease ([place])   Obstructive sleep apnea   Pulmonary hypertension severe   Sleep apnea, obstructive   Chronic diastolic CHF (congestive heart failure), NYHA class 2 (HCC)   ARF (acute renal failure) (HCC)    Discharge Instructions  Discharge Instructions    Diet - low sodium heart healthy   Complete by:  As directed    Discharge instructions   Complete by:  As directed    1) Please take prescribed medications as instructed. 2) Please follow-up at [facility] as instructed as soon as possible.  Name and number of the provider has been attached. 3)Follow up with your PCP in a week.  Follow-up with urology on your scheduled appointment. 4) Do a CBC and BMP test in a  week during the follow-up with your PCP.   Increase activity slowly   Complete by:  As directed      Allergies as of 09/26/2017   No Known Allergies     Medication List    TAKE these medications   acetaminophen 325 MG tablet Commonly known as:  TYLENOL Take 2 tablets (650 mg total) by mouth every 6 (six) hours as needed for mild pain (or Fever  >/= 101).   amLODipine 5 MG tablet Commonly known as:  NORVASC Take 1 tablet (5 mg total) by mouth daily. For BP   amoxicillin-clavulanate 875-125 MG tablet Commonly known as:  AUGMENTIN Take 1 tablet by mouth every 12 (twelve) hours.   aspirin EC 81 MG tablet Take 81 mg by mouth daily.   b complex vitamins tablet Take 1 tablet by mouth daily.   clotrimazole 1 % cream Commonly known as:  LOTRIMIN AF Apply to Lt foot (between) Toes bid for athletes foot   ferrous gluconate 240 (27 FE) MG tablet Commonly known as:  FERGON Take 240 mg by mouth daily.   furosemide 40 MG tablet Commonly known as:  LASIX Take 40mg  (1 tab) in morning and 40mg  (1tab) in evening What changed:  additional instructions   hydrALAZINE 50 MG tablet Commonly known as:  APRESOLINE Take 1 tablet (50 mg total) by mouth 3 (three) times daily.   mouth rinse Liqd solution 15 mLs by Mouth Rinse route 2 (two) times daily.   ondansetron 4 MG tablet Commonly known as:  ZOFRAN Take 1 tablet (4 mg total) by mouth every 6 (six) hours as needed for nausea or vomiting.   oxyCODONE-acetaminophen 5-325 MG tablet Commonly known as:  PERCOCET Take 1 tablet by mouth every 4 (four) hours as needed for up to 5 days for severe pain.   OXYGEN Inhale 2 L into the lungs continuous.   tamsulosin 0.4 MG Caps capsule Commonly known as:  FLOMAX Take 1 capsule (0.4 mg total) by mouth daily after supper.      Follow-up Information    Colon Branch, MD. Schedule an appointment as soon as possible for a visit in 1 week(s).   Specialty:  Internal Medicine Contact information: [address] [place] [address] [phone]        Diona Browner, DDS. Schedule an appointment as soon as possible for a visit in 1 day(s).   Specialty:  Oral Surgery Contact information: [address] [address] [phone]          No Known Allergies  Consultations: None  Procedures/Studies: Dg  Lumbar Spine 2-3 Views  Result Date: 09/06/2017 CLINICAL DATA:  Localized low back pain for 2 days. EXAM: LUMBAR SPINE - 2-3 VIEW COMPARISON:  Body CT 09/05/2017 FINDINGS: Sclerotic appearance of the axial skeleton. Straightening of the lumbosacral lordosis. No evidence of compression deformity to suggest acute fracture. IMPRESSION: Grossly abnormal sclerotic appearance of the visualized axial skeleton, without evidence of acute fracture. Electronically Signed   By: Fidela Salisbury M.D.   On: 09/06/2017 21:41   Ct Maxillofacial W Contrast  Result Date: 09/24/2017 CLINICAL DATA:  It is evaluation for generalized dental pain with swelling under left eye. EXAM: CT MAXILLOFACIAL WITH CONTRAST TECHNIQUE: Multidetector CT imaging of the maxillofacial structures was performed with intravenous contrast. Multiplanar CT image reconstructions were also generated. CONTRAST:  165mL OMNIPAQUE IOHEXOL 300 MG/ML  SOLN COMPARISON:  None. FINDINGS: Osseous: No acute osseous abnormality about the face. Prominent dental  caries present at the left maxillary first and second bicuspids. Associated periapical lucency visualized bones of the neck are diffusely sclerotic in appearance without discrete lesion cervical spondylolysis noted. Orbits: Globes and orbital soft tissues within normal limits. No evidence for postseptal or intraorbital cellulitis. Sinuses: Scattered mucosal thickening throughout the ethmoidal air cells and left greater than right maxillary sinuses. No air-fluid levels to suggest acute sinusitis. Trace right mastoid effusion noted. Middle ear cavities are clear. No abnormality at the nasopharynx. Soft tissues: Focal soft tissue swelling present at the lateral left infraorbital soft tissues (series 3, image 62). Finding is nonspecific, and could reflect sequelae of recent trauma or possibly infection/cellulitis. No abscess or drainable fluid collection. No evidence for postseptal or intraorbital extension.  Additional asymmetric hazy soft tissue swelling seen throughout the left masticator space, suspicious for cellulitis. Mild overlying skin thickening an odontogenic source is suspected. No definite discrete odontogenic abscess identified. Vascular calcifications noted within the visualized neck. No adenopathy. Salivary glands normal. Limited intracranial: Unremarkable. IMPRESSION: 1. Asymmetric soft tissue swelling with hazy inflammatory stranding within the left masticator space, suspicious for acute cellulitis. An odontogenic source is suspected with prominent dental caries and periapical lucency about the left first and second maxillary bicuspids. No discrete abscess or drainable fluid collection at this time. 2. Superimposed more focal soft tissue swelling at the lateral left infraorbital soft tissues. Finding could also reflect sequelae of acute infection or possibly superimposed trauma/contusion. Correlation with history and physical exam recommended. No drainable collections within this region. No evidence for postseptal or intraorbital extension. Electronically Signed   By: Jeannine Boga M.D.   On: 09/24/2017 00:34   Dg Chest Port 1 View  Result Date: 09/05/2017 CLINICAL DATA:  Right flank and bladder pain today. History of myelofibrosis. EXAM: PORTABLE CHEST 1 VIEW COMPARISON:  PA and scratch the single view of the chest 08/18/2017. PA and lateral chest 07/16/2016 and 07/06/2017. FINDINGS: The lungs are clear. Heart size is enlarged. No pneumothorax or pleural effusion. Diffuse sclerosis in all imaged bones is unchanged. IMPRESSION: No acute disease. Diffuse sclerosis in all imaged bones is compatible with the patient's history myelofibrosis. Electronically Signed   By: Inge Rise M.D.   On: 09/05/2017 14:23   Ct Renal Stone Study  Result Date: 09/05/2017 CLINICAL DATA:  77 year old male with right flank and bladder pain. History of bladder retention with chronic indwelling Foley  catheter. EXAM: CT ABDOMEN AND PELVIS WITHOUT CONTRAST TECHNIQUE: Multidetector CT imaging of the abdomen and pelvis was performed following the standard protocol without IV contrast. COMPARISON:  Recent prior CT abdomen/pelvis 08/18/2017; contrast-enhanced CT scan of the abdomen and pelvis 06/07/2017 FINDINGS: Lower chest: Mild dependent atelectasis bilaterally. Otherwise, no acute abnormality within the visualized lower chest. Hepatobiliary: Normal hepatic contour and morphology. No discrete hepatic lesions. Normal appearance of the gallbladder. No intra or extrahepatic biliary ductal dilatation. Pancreas: Unremarkable. No pancreatic ductal dilatation or surrounding inflammatory changes. Spleen: Normal in size without focal abnormality. Adrenals/Urinary Tract: Normal adrenal glands. Stable simple cyst arising from the lower pole of the right kidney. No evidence of hydronephrosis or nephrolithiasis. The ureters are unremarkable. The bladder is diffusely thick walled. A Foley catheter is in place. Multiple bladder diverticula. The largest measures 4.2 x 4.0 cm. Stomach/Bowel: No focal bowel wall thickening or evidence of obstruction. Colonic diverticulosis without evidence of active diverticulitis. Vascular/Lymphatic: Limited evaluation in the absence of intravenous contrast. Atherosclerotic calcifications present along the abdominal aorta. No aneurysm. No suspicious lymphadenopathy. Reproductive: Prostate is unremarkable.  Other: No abdominal wall hernia or abnormality. No abdominopelvic ascites. Musculoskeletal: Diffusely abnormal osseous structures with heterogeneous sclerosis and innumerable small lucent lesions throughout all visualized bones. These findings are essentially unchanged over the past several studies. IMPRESSION: 1. No acute abnormality within the abdomen or pelvis. 2. Foley catheter well positioned in the bladder. The bladder is diffusely thick walled and contains multiple diverticula. Wall  thickening could reflect chronic trabeculation, or acute or chronic cystitis. If the diverticula are incompletely emptying, this could provide a source for urinary stasis and bacterial overgrowth. 3. Colonic diverticular disease without CT evidence of active inflammation. 4. Diffusely abnormal osseous structures with heterogeneous sclerosis and innumerable small lucent lesions. 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 Imaging findings are unchanged across multiple prior studies. Electronically Signed   By: Jacqulynn Cadet M.D.   On: 09/05/2017 09:17      Subjective:  Patient seen and examined at bedside this morning.  His left-sided facial swelling and pain has improved.  Patient is stable for discharge to home today with oral antibiotics.  Discharge planning discussed with the patient. Discharge Exam: Vitals:   09/26/17 1141 09/26/17 1524  BP: (!) 141/68 (!) 164/79  Pulse:    Resp:    Temp:    SpO2:     Vitals:   09/26/17 0614 09/26/17 0729 09/26/17 1141 09/26/17 1524  BP:  140/70 (!) 141/68 (!) 164/79  Pulse:  68    Resp:  18    Temp:  98.4 F (36.9 C)    TempSrc:  Oral    SpO2:  98%    Weight: 82.4 kg (181 lb 10.5 oz)     Height:        General: Pt is alert, awake, not in acute distress, mild left facial swelling Cardiovascular: RRR, S1/S2 +, no rubs, no gallops Respiratory: CTA bilaterally, no wheezing, no rhonchi Abdominal: Soft, NT, ND, bowel sounds + Extremities: no edema, no cyanosis    The results of significant diagnostics from this hospitalization (including imaging, microbiology, ancillary and laboratory) are listed below for reference.     Microbiology: No results found for this or any previous visit (from the past 240 hour(s)).   Labs: BNP (last 3 results) Recent Labs    01/06/17 0931 07/06/17 1317 07/16/17 0558  BNP 435.8* 419.5* 614.4*   Basic Metabolic Panel: Recent Labs  Lab 09/22/17 0818 09/23/17 2103 09/24/17 0605  NA  140 139 142  K 4.6 4.9 4.3  CL 107 104 108  CO2 27 27 25   GLUCOSE 114 100* 123*  BUN 62* 61* 57*  CREATININE 1.21 1.30* 1.12  CALCIUM 10.2 9.6 9.6   Liver Function Tests: Recent Labs  Lab 09/22/17 0818  AST 26  ALT 23  ALKPHOS 132  BILITOT <0.2*  PROT 6.5  ALBUMIN 3.2*   No results for input(s): LIPASE, AMYLASE in the last 168 hours. No results for input(s): AMMONIA in the last 168 hours. CBC: Recent Labs  Lab 09/22/17 0818 09/23/17 2103 09/24/17 0605 09/25/17 0233 09/26/17 0313  WBC 13.3* 13.1* 12.3* 12.6* 14.3*  NEUTROABS 8.3* 7.3  --  7.0 8.4*  HGB 11.6* 10.8* 9.9* 9.7* 10.3*  HCT 37.3* 34.9* 32.9* 32.2* 34.0*  MCV 87.8 87.0 85.9 85.4 86.1  PLT 269 246 242 253 259   Cardiac Enzymes: No results for input(s): CKTOTAL, CKMB, CKMBINDEX, TROPONINI in the last 168 hours. BNP: Invalid input(s): POCBNP CBG: Recent Labs  Lab 09/24/17 0730 09/25/17 0728  GLUCAP 92 103*  D-Dimer No results for input(s): DDIMER in the last 72 hours. Hgb A1c No results for input(s): HGBA1C in the last 72 hours. Lipid Profile No results for input(s): CHOL, HDL, LDLCALC, TRIG, CHOLHDL, LDLDIRECT in the last 72 hours. Thyroid function studies No results for input(s): TSH, T4TOTAL, T3FREE, THYROIDAB in the last 72 hours.  Invalid input(s): FREET3 Anemia work up No results for input(s): VITAMINB12, FOLATE, FERRITIN, TIBC, IRON, RETICCTPCT in the last 72 hours. Urinalysis    Component Value Date/Time   COLORURINE YELLOW 09/05/2017 0108   APPEARANCEUR HAZY (A) 09/05/2017 0108   LABSPEC 1.010 09/05/2017 0108   PHURINE 7.0 09/05/2017 0108   GLUCOSEU NEGATIVE 09/05/2017 0108   GLUCOSEU NEGATIVE 05/02/2015 1008   HGBUR SMALL (A) 09/05/2017 0108   BILIRUBINUR NEGATIVE 09/05/2017 0108   KETONESUR NEGATIVE 09/05/2017 0108   PROTEINUR 100 (A) 09/05/2017 0108   UROBILINOGEN 0.2 05/02/2015 1008   NITRITE NEGATIVE 09/05/2017 0108   LEUKOCYTESUR LARGE (A) 09/05/2017 0108   Sepsis  Labs Invalid input(s): PROCALCITONIN,  WBC,  LACTICIDVEN Microbiology No results found for this or any previous visit (from the past 240 hour(s)).   Time coordinating discharge: 35 minutes  SIGNED:   Shelly Coss, MD  [facility] 09/26/2017, 3:58 PM Pager [phone]  If 7PM-7AM, please contact night-coverage [email or web address] Password [email or web address]

## 2017-09-26 NOTE — Progress Notes (Signed)
Pt discharged to home via [facility] ambulance.  Pt A&O at discharge without signs of distress.  On 2L [place].  F/C intact.  All belongings sent home with pt.  AVS reviewed with pt and he verbalized understanding of medication scheduled and follow up.  VSS at discharge.  Rx and coupon for antibiotics sent home with pt and other prescriptions called into [facility] at pt's request.

## 2017-09-27 DIAGNOSIS — R338 Other retention of urine: Secondary | ICD-10-CM | POA: Diagnosis not present

## 2017-10-13 MED FILL — hydrALAZINE HCL 25 MG TABS: 25 | 30 days supply | Qty: 90 | Fill #1

## 2017-10-13 MED FILL — FUROSEMIDE 40 MG TAB: 40 | 30 days supply | Qty: 90 | Fill #1

## 2017-10-13 NOTE — Progress Notes (Signed)
Patient ID: Juan [name], male   DOB: [birth year], 77 y.o.   MRN: [id number]    Advanced Heart Failure Clinic Note   PCP: Dr Larose Kells  Primary HF Cardiologist: Dr Haroldine Laws  Primary Cardiologist: Dr Johnsie Cancel   HPI: Juan [name] is a 77 y.o. male with history of Diastolic HF, OSA, HTN, DM2, and myeloproliferative disorder followed by Oncology.   He was seen by onc 10/22/14 for his myeloproliferative disorder. 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   Admitted to [facility] 01/2015 with increased dyspnea and volume overload. Diuresed with lasix and transitioned to lasix 40 mg three times a day.   Admitted twice in  December 2017 with volume overload.  Diuresed with IV lasix the transitioned to lasix 40 mg twice a day. Discharge weight 198 pounds. Discharged to SNF.    Admitted 10/08/16-10/17/16 with volume overload. PA pressure on Echo was 85 mmHg. This was a newly high PA pressure for him. VQ scan negative for PE. ANA, RA and HIV negative. He was diuresed with IV lasix about 15 pounds. Discharge weight was 194 pounds. It was recommended that he go to SNF, but he preferred to go home with home health. He also was started on home 02 at 2L.   Admitted earlier 12/2016 with hip and flank pain. Torsemide and metolazone held at discharge. Per note because patient "wasn't in a CHF flare".   Admitted 01/23/17-01/26/17 with hip, back and foot pain. 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 Dr. Marin Olp saw in the hospital and [name] was started. Weight was down to 185 pounds, his lasix was stopped at discharge.   Admitted 05/2017 with lower GI bleed. Had scope with clip placed. Aspirin stopped. Discharged on lasix 40 mg daily. He refused SNF placement.   He has had 6 admission since November 9390  for complicated UTI (1 for GI bleed). He has an indwelling foley and urology follow up. Recently refused PICC line so received one week of daptomycin as inpatient. He was previously in SNF in December, but demanded he be sent home due to PT/OT requirement. He has since refused SNF follow up.   He presents today for follow up. Has been admitted for UTI and oral cellulitis since last visit. He is doing much better. Living at home with his son. He continues to eat high salt food, rinsing them or mixing in honey to offset the salt, per patient. He reports taking his medication this am. Foley in place. Sees urology next week for possible removal.  Has continuously refused HH, Paramedicine, and SNF. He denies SOB at home. Not very active. Reports taking all medication as directed.   Echo 01/21/15 EF 30%, moderate LVH, mild MR, PA peak pressure 59 mm Hg, Normal RV ECHO 12/2015: Ef 55-60%. Grade IDD Echo 09/2016 EF 55-60%, PA pressure 85 mm Hg.   Review of systems complete and found to be negative unless listed in HPI.    SH:  Social History   Socioeconomic History  . Marital status: Widowed    Spouse name: Not on file  . Number of children: 2  . Years of education: Not on file  . Highest education level: Not on file  Occupational History  . Occupation: retired     Fish farm manager: RETIRED  Social Needs  .  Financial resource strain: Not on file  . Food insecurity:    Worry: Not on file    Inability: Not on file  . Transportation needs:    Medical: Not on file    Non-medical: Not on file  Tobacco Use  . Smoking status: Former Smoker    Packs/day: 0.25    Years: 12.00    Pack years: 3.00    Types: Cigarettes    Last attempt to quit: 05/18/1966    Years since quitting: 51.4  . Smokeless tobacco: Never Used  . Tobacco comment: quit smoking 45 years ago  Substance and Sexual Activity  . Alcohol use: No    Alcohol/week: 0.0 oz  . Drug use: No  . Sexual activity: Yes  Lifestyle  . Physical  activity:    Days per week: Not on file    Minutes per session: Not on file  . Stress: Not on file  Relationships  . Social connections:    Talks on phone: Not on file    Gets together: Not on file    Attends religious service: Not on file    Active member of club or organization: Not on file    Attends meetings of clubs or organizations: Not on file    Relationship status: Not on file  . Intimate partner violence:    Fear of current or ex partner: Not on file    Emotionally abused: Not on file    Physically abused: Not on file    Forced sexual activity: Not on file  Other Topics Concern  . Not on file  Social History Narrative   Moved from [place] 2006   Widow 2007   Son lives with him       Admitted to [facility] 05/08/17   Former smoker-stopped 1968   Alcohol none   Full Code   FH: Family History  Problem Relation Age of Onset  . Schizophrenia Son   . Mental illness Daughter   . Colon cancer Neg Hx   . Prostate cancer Neg Hx   . Heart attack Neg Hx   . Diabetes Neg Hx     Past Medical History:  Diagnosis Date  . Allergic rhinitis   . Anemia   . Ascending aortic aneurysm ([place]) 03/2014   4.3cm on CT scan  . CAD (coronary artery disease)    dx elsewheer in past, no documentation. Non-ischemic myoview 2007  . Chronic diastolic CHF (congestive heart failure), NYHA class 2 (HCC)    Normal EF w/ grade 1 dd by echo 12/2015  . Chronic respiratory failure with hypoxia/2L at home    "2L; all the time" (05/03/2017)  . CKD (chronic kidney disease), stage III ([place])   . Edema    R>L leg, u/s 5-12 neg for DVT  . Hemorrhoid   . History of hydronephrosis   . History of Splenic infarct   . History of thrombocytosis   . Hypertension   . Iron deficiency anemia due to chronic blood loss 03/31/2017  . 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 Migraine    "once/wk at least" (07/11/2013)  . Moderate to severe pulmonary hypertension ([place])   . Myeloproliferative disease  ([place])   . Pyelonephritis 04/29/2017  . Recurrent Pseudomonas urinary tract infection   . Shortness of breath   . Sinus congestion   . Sleep apnea, obstructive    at some point used CPAP, was d/c  years ago  . Type II diabetes mellitus ([place])   .  UTI (urinary tract infection) 06/2017  . UTI (urinary tract infection) 07/2017    Current Outpatient Medications  Medication Sig Dispense Refill  . acetaminophen (TYLENOL) 325 MG tablet Take 2 tablets (650 mg total) by mouth every 6 (six) hours as needed for mild pain (or Fever >/= 101).    Marland Kitchen amLODipine (NORVASC) 5 MG tablet Take 1 tablet (5 mg total) by mouth daily. For BP 30 tablet 2  . amoxicillin-clavulanate (AUGMENTIN) 875-125 MG tablet Take 1 tablet by mouth every 12 (twelve) hours. 10 tablet 0  . aspirin EC 81 MG tablet Take 81 mg by mouth daily.    Marland Kitchen b complex vitamins tablet Take 1 tablet by mouth daily.    . benzonatate (TESSALON) 100 MG capsule Take by mouth 3 (three) times daily as needed for cough.    . cephALEXin (KEFLEX) 500 MG capsule Take 500 mg by mouth 4 (four) times daily.    . clotrimazole (LOTRIMIN AF) 1 % cream Apply to Lt foot (between) Toes bid for athletes foot 28 g 0  . ferrous gluconate (FERGON) 240 (27 FE) MG tablet Take 240 mg by mouth daily.    . furosemide (LASIX) 40 MG tablet Take 67m (1 tab) in morning and 437m(1tab) in evening 1 tablet 0  . hydrALAZINE (APRESOLINE) 50 MG tablet Take 1 tablet (50 mg total) by mouth 3 (three) times daily. 90 tablet 3  . miconazole (MICOTIN) 2 % powder Apply topically as needed for itching.    . mouth rinse LIQD solution 15 mLs by Mouth Rinse route 2 (two) times daily. 118 mL 0  . ondansetron (ZOFRAN) 4 MG tablet Take 1 tablet (4 mg total) by mouth every 6 (six) hours as needed for nausea or vomiting. 10 tablet 0  . OXYGEN Inhale 2 L into the lungs continuous.    . tamsulosin (FLOMAX) 0.4 MG CAPS capsule Take 1 capsule (0.4 mg total) by mouth daily after supper. 30 capsule 0    No current facility-administered medications for this encounter.    Vitals:   10/14/17 0942  BP: (!) 164/70  Pulse: 72  SpO2: 96%  Weight: 180 lb (81.6 kg)     Wt Readings from Last 3 Encounters:  10/14/17 180 lb (81.6 kg)  09/26/17 181 lb 10.5 oz (82.4 kg)  09/09/17 189 lb 2.5 oz (85.8 kg)    PHYSICAL EXAM:  General: Elderly. Chronically ill appearing. NAD HEENT: Normal Neck: Supple. JVP 6-7 cm. Carotids 2+ bilat; no bruits. No thyromegaly or nodule noted. Cor: PMI nondisplaced. RRR, No M/G/R noted Lungs: CTAB, normal effort. Abdomen: Soft, non-tender, non-distended, no HSM. No bruits or masses. +BS  Extremities: No cyanosis, clubbing, or rash. Trace ankle edema.  Neuro: Alert & orientedx3, cranial nerves grossly intact. moves all 4 extremities w/o difficulty. Affect pleasant   ASSESSMENT & PLAN: 1. Chronic combined systolic and diastolic CHF, EF 3027%cho 01/21/15, Normal myoview 2007.  EF now 55-60% in 09/2016. - NYHA III symptoms at least - Volume status stable on exam.   - Continue lasix 80 mg q am and 40 mg q pm for now. Should take extra tab in evening as needed.  - He continues to refuse any help in the home.  - Reinforced fluid restriction to < 2 L daily, sodium restriction to less than 2000 mg daily, and the importance of daily weights.    2. CKD stage III - BMET today.   3. HTN - Continue amlodipine 5 mg daily.  -  Increase hydralazine to 75 mg TID.  - Meds as above.   4. 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 - Now seeing Dr. Marin Olp. 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  - No change to current plan.    5. H/O GI Bleed  - Denies bleeding.  - S/P sigmoidoscopy with clip placed 05/2017   6. Chronic urinary retention - Admitted 08/2017 for UTI.  - Foley in place. Sees urology next week per patient.   Meds and labs as above. RTC 2 months. Sooner with symptoms.   Shirley Friar,  PA-C  10/14/17   Greater than 50% of the 25 minute visit was spent in counseling/coordination of care regarding disease state education, salt/fluid restriction, sliding scale diuretics, and medication compliance.

## 2017-10-14 ENCOUNTER — Ambulatory Visit (HOSPITAL_COMMUNITY)
Admission: RE | Admit: 2017-10-14 | Discharge: 2017-10-14 | Disposition: A | Discharge status: Home / Self Care | Payer: Medicare Other | Source: Ambulatory Visit | Attending: Cardiology | Admitting: Cardiology

## 2017-10-14 VITALS — BP 164/70 | HR 72 | Wt 180.0 lb

## 2017-10-14 DIAGNOSIS — N183 Chronic kidney disease, stage 3 unspecified: Secondary | ICD-10-CM

## 2017-10-14 DIAGNOSIS — N390 Urinary tract infection, site not specified: Secondary | ICD-10-CM | POA: Diagnosis not present

## 2017-10-14 DIAGNOSIS — Z79899 Other long term (current) drug therapy: Secondary | ICD-10-CM | POA: Insufficient documentation

## 2017-10-14 DIAGNOSIS — Z7982 Long term (current) use of aspirin: Secondary | ICD-10-CM | POA: Insufficient documentation

## 2017-10-14 DIAGNOSIS — D471 Chronic myeloproliferative disease: Secondary | ICD-10-CM

## 2017-10-14 DIAGNOSIS — I272 Pulmonary hypertension, unspecified: Secondary | ICD-10-CM | POA: Insufficient documentation

## 2017-10-14 DIAGNOSIS — Z87891 Personal history of nicotine dependence: Secondary | ICD-10-CM | POA: Insufficient documentation

## 2017-10-14 DIAGNOSIS — C946 Myelodysplastic disease, not classified: Secondary | ICD-10-CM | POA: Insufficient documentation

## 2017-10-14 DIAGNOSIS — I712 Thoracic aortic aneurysm, without rupture: Secondary | ICD-10-CM | POA: Insufficient documentation

## 2017-10-14 DIAGNOSIS — G4733 Obstructive sleep apnea (adult) (pediatric): Secondary | ICD-10-CM | POA: Insufficient documentation

## 2017-10-14 DIAGNOSIS — E1122 Type 2 diabetes mellitus with diabetic chronic kidney disease: Secondary | ICD-10-CM | POA: Diagnosis not present

## 2017-10-14 DIAGNOSIS — I2510 Atherosclerotic heart disease of native coronary artery without angina pectoris: Secondary | ICD-10-CM | POA: Diagnosis not present

## 2017-10-14 DIAGNOSIS — I5042 Chronic combined systolic (congestive) and diastolic (congestive) heart failure: Secondary | ICD-10-CM | POA: Insufficient documentation

## 2017-10-14 DIAGNOSIS — I5032 Chronic diastolic (congestive) heart failure: Secondary | ICD-10-CM | POA: Diagnosis not present

## 2017-10-14 DIAGNOSIS — I130 Hypertensive heart and chronic kidney disease with heart failure and stage 1 through stage 4 chronic kidney disease, or unspecified chronic kidney disease: Secondary | ICD-10-CM | POA: Insufficient documentation

## 2017-10-14 LAB — BASIC METABOLIC PANEL
ANION GAP: 7 (ref 5–15)
BUN: 50 mg/dL — ABNORMAL HIGH (ref 6–20)
CHLORIDE: 105 mmol/L (ref 101–111)
CO2: 27 mmol/L (ref 22–32)
Calcium: 9.8 mg/dL (ref 8.9–10.3)
Creatinine, Ser: 1.22 mg/dL (ref 0.61–1.24)
GFR calc non Af Amer: 56 mL/min — ABNORMAL LOW (ref >60)
Glucose, Bld: 94 mg/dL (ref 65–99)
POTASSIUM: 4.5 mmol/L (ref 3.5–5.1)
SODIUM: 139 mmol/L (ref 135–145)

## 2017-10-14 MED ORDER — HYDRALAZINE HCL 50 MG PO TABS: 75.0000 mg | ORAL_TABLET | Freq: Three times a day (TID) | ORAL | 2 refills | Status: DC

## 2017-10-14 NOTE — Patient Instructions (Signed)
Routine lab work today. Will notify you of abnormal results, otherwise no news is good news!  INCREASE Hydralazine to 75 mg (1.5 tabs) three times daily (once every 8 hours). Prescription has been sent to [facility] under the [place]. Address: [address] [place], [place] [address]  Phone: [phone] Located next to [facility].  Follow up 2 months with Oda Kilts PA-C.  ________________________________________________________________ Juan [name] Code: 1400  Take all medication as prescribed the day of your appointment. Bring all medications with you to your appointment.  Do the following things EVERYDAY: 1) Weigh yourself in the morning before breakfast. Write it down and keep it in a log. 2) Take your medicines as prescribed 3) Eat low salt foods-Limit salt (sodium) to 2000 mg per day.  4) Stay as active as you can everyday 5) Limit all fluids for the day to less than 2 liters

## 2017-10-19 DIAGNOSIS — R339 Retention of urine, unspecified: Secondary | ICD-10-CM | POA: Diagnosis not present

## 2017-10-21 ENCOUNTER — Emergency Department (HOSPITAL_COMMUNITY)
Admission: EM | Admit: 2017-10-21 | Discharge: 2017-10-21 | Disposition: A | Discharge status: Home / Self Care | Payer: Medicare Other | Attending: Emergency Medicine | Admitting: Emergency Medicine

## 2017-10-21 ENCOUNTER — Encounter (HOSPITAL_COMMUNITY): Payer: Self-pay

## 2017-10-21 DIAGNOSIS — R300 Dysuria: Secondary | ICD-10-CM | POA: Diagnosis not present

## 2017-10-21 DIAGNOSIS — N183 Chronic kidney disease, stage 3 (moderate): Secondary | ICD-10-CM | POA: Diagnosis not present

## 2017-10-21 DIAGNOSIS — Z87891 Personal history of nicotine dependence: Secondary | ICD-10-CM | POA: Diagnosis not present

## 2017-10-21 DIAGNOSIS — R319 Hematuria, unspecified: Secondary | ICD-10-CM | POA: Diagnosis not present

## 2017-10-21 DIAGNOSIS — E119 Type 2 diabetes mellitus without complications: Secondary | ICD-10-CM | POA: Insufficient documentation

## 2017-10-21 DIAGNOSIS — M2550 Pain in unspecified joint: Secondary | ICD-10-CM | POA: Diagnosis not present

## 2017-10-21 DIAGNOSIS — Z79899 Other long term (current) drug therapy: Secondary | ICD-10-CM | POA: Diagnosis not present

## 2017-10-21 DIAGNOSIS — I5032 Chronic diastolic (congestive) heart failure: Secondary | ICD-10-CM | POA: Insufficient documentation

## 2017-10-21 DIAGNOSIS — I130 Hypertensive heart and chronic kidney disease with heart failure and stage 1 through stage 4 chronic kidney disease, or unspecified chronic kidney disease: Secondary | ICD-10-CM | POA: Diagnosis not present

## 2017-10-21 DIAGNOSIS — N390 Urinary tract infection, site not specified: Secondary | ICD-10-CM | POA: Diagnosis not present

## 2017-10-21 DIAGNOSIS — Z7401 Bed confinement status: Secondary | ICD-10-CM | POA: Diagnosis not present

## 2017-10-21 DIAGNOSIS — I2510 Atherosclerotic heart disease of native coronary artery without angina pectoris: Secondary | ICD-10-CM | POA: Insufficient documentation

## 2017-10-21 DIAGNOSIS — R0902 Hypoxemia: Secondary | ICD-10-CM | POA: Diagnosis not present

## 2017-10-21 LAB — URINALYSIS, ROUTINE W REFLEX MICROSCOPIC
Bilirubin Urine: NEGATIVE
GLUCOSE, UA: NEGATIVE mg/dL
Ketones, ur: NEGATIVE mg/dL
Nitrite: NEGATIVE
PH: 6 (ref 5.0–8.0)
Protein, ur: 100 mg/dL — AB
Specific Gravity, Urine: 1.009 (ref 1.005–1.030)

## 2017-10-21 MED ORDER — LEVOFLOXACIN 750 MG PO TABS: 750.0000 mg | ORAL_TABLET | Freq: Every day | ORAL | 0 refills | Status: DC

## 2017-10-21 NOTE — Discharge Instructions (Addendum)
Levaquin as prescribed.  Follow up with your urologist in the next 3 to 4 days.

## 2017-10-21 NOTE — ED Notes (Signed)
Pt to be sent back to [facility]. Secretary [employer] for pt ride back home.

## 2017-10-21 NOTE — ED Triage Notes (Signed)
Pt comes via [facility] for recurrent UTI, had chronic foley removed on Tuesday, reports painful urination since, incontinent episode.

## 2017-10-21 NOTE — ED Provider Notes (Signed)
[place] [facility] EMERGENCY DEPARTMENT Provider Note   CSN: [id number] Arrival date & time: 10/21/17  0148     History   Chief Complaint Chief Complaint  Patient presents with  . Recurrent UTI    HPI Juan [name] is a 77 y.o. male.  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  He presents today with complaints of dysuria.  He had his indwelling Foley catheter removed yesterday and has been having burning with urination and incontinence since. He denies any fevers, chills, or abdominal pain.  The history is provided by the patient.    Past Medical History:  Diagnosis Date  . Allergic rhinitis   . Anemia   . Ascending aortic aneurysm ([place]) 03/2014   4.3cm on CT scan  . CAD (coronary artery disease)    dx elsewheer in past, no documentation. Non-ischemic myoview 2007  . Chronic diastolic CHF (congestive heart failure), NYHA class 2 (HCC)    Normal EF w/ grade 1 dd by echo 12/2015  . Chronic respiratory failure with hypoxia/2L at home    "2L; all the time" (05/03/2017)  . CKD (chronic kidney disease), stage III ([place])   . Edema    R>L leg, u/s 5-12 neg for DVT  . Hemorrhoid   . History of hydronephrosis   . History of Splenic infarct   . History of thrombocytosis   . Hypertension   . Iron deficiency anemia due to chronic blood loss 03/31/2017  . 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 Migraine    "once/wk at least" (07/11/2013)  . Moderate to severe pulmonary hypertension ([place])   . Myeloproliferative disease ([place])   . Pyelonephritis 04/29/2017  . Recurrent Pseudomonas urinary tract infection   . Shortness of breath   . Sinus congestion   . Sleep apnea, obstructive    at some point used CPAP, was d/c  years ago  . Type II diabetes mellitus ([place])   . UTI (urinary tract infection) 06/2017  . UTI  (urinary tract infection) 07/2017    Patient Active Problem List   Diagnosis Date Noted  . Facial cellulitis 09/24/2017  . ARF (acute renal failure) ([place]) 09/24/2017  . UTI (urinary tract infection) 09/05/2017  . VRE (vancomycin resistant enterococcus) culture positive   . Staphylococcus epidermidis infection   . Acute encephalopathy 08/18/2017  . Sleep apnea, obstructive 08/18/2017  . 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 History of recurrent Pseudomonas urinary tract infection 08/18/2017  . Moderate to severe pulmonary hypertension ([place]) 08/18/2017  . CKD (chronic kidney disease), stage III ([place]) 08/18/2017  . Chronic respiratory failure with hypoxia ([place]) 08/18/2017  . Chronic diastolic CHF (congestive heart failure), NYHA class 2 ([place]) 08/18/2017  . 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 HTN (hypertension) 08/18/2017  . Evaluation by psychiatric service required   . Pyelonephritis 07/17/2017  . Recurrent UTI 07/16/2017  . Acute exacerbation of CHF (congestive heart failure) ([place]) 07/06/2017  . Complicated UTI (urinary tract infection) 07/06/2017  . Acute GI bleeding 06/07/2017  . Hypokalemia   . Iron deficiency anemia due to chronic blood loss 03/31/2017  . 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 Urinary retention 01/24/2017  . 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 Elevated troponin 11/14/2016  . Recurrent pneumonia 10/31/2016  . Pulmonary hypertension severe   . Sprain of left rotator cuff capsule   . Hyperkalemia   .  Paroxysmal SVT (supraventricular tachycardia) ([place]) 04/20/2016  . Type II diabetes mellitus ([place]) 04/20/2016  . Gout 04/20/2016  . B12 deficiency 04/20/2016  . 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 Hydronephrosis   . Thrombocytosis ([place]) 04/07/2016  . Pressure ulcer stage II 12/31/2015  . T wave inversion in EKG 12/29/2015  . PCP NOTES >>>>>>>>>>>>>>>>> 05/02/2015  . Peripheral edema   .  Thoracic aortic aneurysm ([place]) 04/26/2014  . CKD (chronic kidney disease) stage 3, GFR 30-59 ml/min (HCC) 04/26/2014  . Generalized weakness 04/06/2014  . Venous stasis dermatitis of both lower extremities   . Morbid obesity due to excess calories ([place]) 03/29/2014  . 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 Leukocytosis 10/17/2013  . Poor compliance with advise  05/05/2012  . Annual physical exam 01/14/2012  . Weight loss 01/14/2012  . BPH (benign prostatic hyperplasia) 01/14/2012  . Myeloproliferative disease ([place]) 09/07/2006  . Obstructive sleep apnea 09/07/2006  . Hypertensive heart disease with chronic diastolic congestive heart failure ([place]) 09/07/2006  . Coronary atherosclerosis 09/07/2006  . Chronic diastolic CHF (congestive heart failure) ([place]) 09/07/2006  . Allergic rhinitis 09/07/2006    Past Surgical History:  Procedure Laterality Date  . FLEXIBLE SIGMOIDOSCOPY N/A 06/08/2017   Procedure: FLEXIBLE SIGMOIDOSCOPY;  Surgeon: Carol Ada, MD;  Location: [facility];  Service: Endoscopy;  Laterality: N/A;  . IR FLUORO GUIDE CV LINE RIGHT  05/07/2017  . IR FLUORO RM 30-60 MIN  05/07/2017  . IR US GUIDE VASC ACCESS RIGHT  05/07/2017  . TOE SURGERY Right    "tried to straighten out big toe" (07/11/2013)        Home Medications    Prior to Admission medications   Medication Sig Start Date End Date Taking? Authorizing Provider  acetaminophen (TYLENOL) 325 MG tablet Take 2 tablets (650 mg total) by mouth every 6 (six) hours as needed for mild pain (or Fever >/= 101). 05/08/17   Rai, Ripudeep K, MD  amLODipine (NORVASC) 5 MG tablet Take 1 tablet (5 mg total) by mouth daily. For BP 09/13/17   Emokpae, Courage, MD  amoxicillin-clavulanate (AUGMENTIN) 875-125 MG tablet Take 1 tablet by mouth every 12 (twelve) hours. 09/26/17   Shelly Coss, MD  aspirin EC 81 MG tablet Take 81 mg by mouth daily.    [provider]  b complex vitamins tablet Take 1 tablet by  mouth daily.    [provider]  benzonatate (TESSALON) 100 MG capsule Take by mouth 3 (three) times daily as needed for cough.    [provider]  cephALEXin (KEFLEX) 500 MG capsule Take 500 mg by mouth 4 (four) times daily.    [provider]  clotrimazole (LOTRIMIN AF) 1 % cream Apply to Lt foot (between) Toes bid for athletes foot 09/12/17   Roxan Hockey, MD  ferrous gluconate (FERGON) 240 (27 FE) MG tablet Take 240 mg by mouth daily.    [provider]  furosemide (LASIX) 40 MG tablet Take 40mg  (1 tab) in morning and 40mg  (1tab) in evening 09/26/17   Shelly Coss, MD  hydrALAZINE (APRESOLINE) 50 MG tablet Take 1.5 tablets (75 mg total) by mouth 3 (three) times daily. 10/14/17   Shirley Friar, PA-C  miconazole (MICOTIN) 2 % powder Apply topically as needed for itching.    [provider]  mouth rinse LIQD solution 15 mLs by Mouth Rinse route 2 (two) times daily. 09/26/17   Shelly Coss, MD  ondansetron (ZOFRAN) 4 MG tablet Take 1 tablet (  4 mg total) by mouth every 6 (six) hours as needed for nausea or vomiting. 09/12/17   Roxan Hockey, MD  OXYGEN Inhale 2 L into the lungs continuous.    [provider]  tamsulosin (FLOMAX) 0.4 MG CAPS capsule Take 1 capsule (0.4 mg total) by mouth daily after supper. 06/13/17   Bonnielee Haff, MD    Family History Family History  Problem Relation Age of Onset  . Schizophrenia Son   . Mental illness Daughter   . Colon cancer Neg Hx   . Prostate cancer Neg Hx   . Heart attack Neg Hx   . Diabetes Neg Hx     Social History Social History   Tobacco Use  . Smoking status: Former Smoker    Packs/day: 0.25    Years: 12.00    Pack years: 3.00    Types: Cigarettes    Last attempt to quit: 05/18/1966    Years since quitting: 51.4  . Smokeless tobacco: Never Used  . Tobacco comment: quit smoking 45 years ago  Substance Use Topics  . Alcohol use: No    Alcohol/week: 0.0 oz  . Drug  use: No     Allergies   Patient has no known allergies.   Review of Systems Review of Systems  All other systems reviewed and are negative.    Physical Exam Updated Vital Signs BP (!) 155/88   Pulse 67   Resp 16   SpO2 99%   Physical Exam  Constitutional: He is oriented to person, place, and time. He appears well-developed and well-nourished. No distress.  HENT:  Head: Normocephalic and atraumatic.  Mouth/Throat: Oropharynx is clear and moist.  Neck: Normal range of motion. Neck supple.  Cardiovascular: Normal rate and regular rhythm. Exam reveals no friction rub.  No murmur heard. Pulmonary/Chest: Effort normal and breath sounds normal. No respiratory distress. He has no wheezes. He has no rales.  Abdominal: Soft. Bowel sounds are normal. He exhibits no distension. There is no tenderness.  Musculoskeletal: Normal range of motion. He exhibits no edema.  Neurological: He is alert and oriented to person, place, and time. Coordination normal.  Skin: Skin is warm and dry. He is not diaphoretic.  Nursing note and vitals reviewed.    ED Treatments / Results  Labs (all labs ordered are listed, but only abnormal results are displayed) Labs Reviewed  URINALYSIS, ROUTINE W REFLEX MICROSCOPIC - Abnormal; Notable for the following components:      Result Value   APPearance HAZY (*)    Hgb urine dipstick SMALL (*)    Protein, ur 100 (*)    Leukocytes, UA LARGE (*)    Bacteria, UA RARE (*)    All other components within normal limits  URINE CULTURE    EKG None  Radiology No results found.  Procedures Procedures (including critical care time)  Medications Ordered in ED Medications - No data to display   Initial Impression / Assessment and Plan / ED Course  I have reviewed the triage vital signs and the nursing notes.  Pertinent labs & imaging results that were available during my care of the patient were reviewed by me and considered in my medical decision making  (see chart for details).  Patient with indwelling Foley catheter due to bladder diverticulum and incomplete emptying of his bladder presenting with incontinence and burning with urination.  A urinalysis was obtained which reveals infection.  He just had his Foley catheter removed yesterday by his urologist.  He is  declining an additional catheter I see no indication for admission or further work-up.  I will prescribe antibiotics for his UTI and have him follow-up with his urologist.  His vitals otherwise appears stable and he is nontoxic-appearing.  Final Clinical Impressions(s) / ED Diagnoses   Final diagnoses:  None    ED Discharge Orders    None       Veryl Speak, MD 10/21/17 0600

## 2017-10-22 LAB — URINE CULTURE

## 2017-10-28 ENCOUNTER — Telehealth: Payer: Self-pay | Admitting: *Deleted

## 2017-10-28 NOTE — Telephone Encounter (Signed)
Received Physician Orders/CMN Oxygen from [facility]; forwarded to provider/SLS 06/13

## 2017-10-29 NOTE — Telephone Encounter (Signed)
Orders signed and faxed to [facility] at [phone]. Form sent for scanning.

## 2017-11-01 ENCOUNTER — Telehealth: Payer: Self-pay | Admitting: Licensed Clinical Social Worker

## 2017-11-01 ENCOUNTER — Other Ambulatory Visit (HOSPITAL_COMMUNITY): Payer: Self-pay

## 2017-11-01 MED FILL — FUROSEMIDE 40 MG TAB: 40 | 30 days supply | Qty: 90 | Fill #0

## 2017-11-01 MED FILL — hydrALAZINE HCL 50 MG TABS: 50 | 30 days supply | Qty: 135 | Fill #0

## 2017-11-01 NOTE — Telephone Encounter (Signed)
Patient called requesting a taxi to take him to the pharmacy to pick up his medications. CSW explained that we are unable to send a taxi but can send Community Paramedic to pick up medications and make home visit to assist with pre fill and review of medications. Patient has been reluctant to accept Paramedics in the past but agrees today for a home visit. CSW contacted [place] who will attempt visit later today. CSW will continue to follow for support as needed. Raquel Sarna, [place], [place]

## 2017-11-02 NOTE — Progress Notes (Signed)
I went to Mr Colglazier house today to deliver medications at the request of Raquel Sarna. Once I gave the medications to Mr Ketchum, he said that he did not need any further assistance. I asked if I could come in and talk with him for a while and he agreed. He stated he did not need help with his medications because his son helps him. I explained that I would be able to check his vital signs regularly and ensure that if he had any changes in his health we could address it without being sent to the hospital. He requested that I leave my phone number and he would call me if he felt like he needed my assistance. I will mention this to the HF clinic and see how they would like to proceed.

## 2017-11-11 ENCOUNTER — Inpatient Hospital Stay: Payer: Medicare Other | Attending: Hematology & Oncology | Admitting: Hematology & Oncology

## 2017-11-11 ENCOUNTER — Inpatient Hospital Stay: Payer: Medicare Other

## 2017-11-19 DIAGNOSIS — R339 Retention of urine, unspecified: Secondary | ICD-10-CM | POA: Diagnosis not present

## 2017-11-29 MED FILL — FUROSEMIDE 40 MG TAB: 40 | 30 days supply | Qty: 90 | Fill #1

## 2017-11-29 MED FILL — hydrALAZINE HCL 50 MG TABS: 50 | 30 days supply | Qty: 135 | Fill #1

## 2017-11-30 ENCOUNTER — Other Ambulatory Visit (HOSPITAL_COMMUNITY): Payer: Self-pay

## 2017-11-30 ENCOUNTER — Telehealth (HOSPITAL_COMMUNITY): Payer: Self-pay | Admitting: Surgery

## 2017-11-30 ENCOUNTER — Other Ambulatory Visit (HOSPITAL_COMMUNITY): Payer: Self-pay | Admitting: Surgery

## 2017-11-30 MED ORDER — AMLODIPINE BESYLATE 5 MG PO TABS: 5.0000 mg | ORAL_TABLET | Freq: Every day | ORAL | 2 refills | Status: DC

## 2017-11-30 MED ORDER — HYDRALAZINE HCL 50 MG PO TABS: 75.0000 mg | ORAL_TABLET | Freq: Three times a day (TID) | ORAL | 2 refills | Status: DC

## 2017-11-30 MED ORDER — FUROSEMIDE 40 MG PO TABS: ORAL_TABLET | ORAL | 0 refills | Status: DC

## 2017-11-30 MED FILL — AMLODIPINE BESYLATE 5 MG TA: 5 | 30 days supply | Qty: 30 | Fill #0

## 2017-11-30 NOTE — Telephone Encounter (Signed)
Juan [name] called to request that we supply a cab to pick up his medications from pharmacy.  I have contacted the HF Community Paramedic and he will pick up requested medications at the [facility] through the [facility] and deliver them to patient's home.  I called Juan [name] to make him aware.

## 2017-11-30 NOTE — Progress Notes (Signed)
I delivered medications to Mr Lizak from the [facility]. He did not require further assistance.

## 2017-12-04 ENCOUNTER — Emergency Department (HOSPITAL_COMMUNITY)
Admission: EM | Admit: 2017-12-04 | Discharge: 2017-12-04 | Disposition: A | Discharge status: Home / Self Care | Payer: Medicare Other | Attending: Emergency Medicine | Admitting: Emergency Medicine

## 2017-12-04 ENCOUNTER — Encounter (HOSPITAL_COMMUNITY): Payer: Self-pay | Admitting: Emergency Medicine

## 2017-12-04 DIAGNOSIS — I130 Hypertensive heart and chronic kidney disease with heart failure and stage 1 through stage 4 chronic kidney disease, or unspecified chronic kidney disease: Secondary | ICD-10-CM | POA: Diagnosis not present

## 2017-12-04 DIAGNOSIS — Z87891 Personal history of nicotine dependence: Secondary | ICD-10-CM | POA: Insufficient documentation

## 2017-12-04 DIAGNOSIS — Z743 Need for continuous supervision: Secondary | ICD-10-CM | POA: Diagnosis not present

## 2017-12-04 DIAGNOSIS — Z79899 Other long term (current) drug therapy: Secondary | ICD-10-CM | POA: Diagnosis not present

## 2017-12-04 DIAGNOSIS — N390 Urinary tract infection, site not specified: Secondary | ICD-10-CM | POA: Diagnosis not present

## 2017-12-04 DIAGNOSIS — R300 Dysuria: Secondary | ICD-10-CM | POA: Diagnosis not present

## 2017-12-04 DIAGNOSIS — R279 Unspecified lack of coordination: Secondary | ICD-10-CM | POA: Diagnosis not present

## 2017-12-04 DIAGNOSIS — N183 Chronic kidney disease, stage 3 (moderate): Secondary | ICD-10-CM | POA: Diagnosis not present

## 2017-12-04 DIAGNOSIS — I2510 Atherosclerotic heart disease of native coronary artery without angina pectoris: Secondary | ICD-10-CM | POA: Insufficient documentation

## 2017-12-04 DIAGNOSIS — I5032 Chronic diastolic (congestive) heart failure: Secondary | ICD-10-CM | POA: Insufficient documentation

## 2017-12-04 LAB — URINALYSIS, ROUTINE W REFLEX MICROSCOPIC
BILIRUBIN URINE: NEGATIVE
Glucose, UA: NEGATIVE mg/dL
HGB URINE DIPSTICK: NEGATIVE
Ketones, ur: NEGATIVE mg/dL
Nitrite: POSITIVE — AB
PH: 5 (ref 5.0–8.0)
Protein, ur: 100 mg/dL — AB
Specific Gravity, Urine: 1.009 (ref 1.005–1.030)

## 2017-12-04 MED ORDER — CEPHALEXIN 500 MG PO CAPS: 500.0000 mg | ORAL_CAPSULE | Freq: Two times a day (BID) | ORAL | 0 refills | Status: DC

## 2017-12-04 NOTE — ED Notes (Signed)
[facility] called  

## 2017-12-04 NOTE — ED Notes (Signed)
Pt not happy that he is being DC, pt believes he should be admitted for abx. Dr Betsey Holiday and myself attempted to explain to pt that his dx doesn't warrant an admission.

## 2017-12-04 NOTE — ED Triage Notes (Signed)
BIB [facility] from home, pt reports dysuria X few days, worse this AM.

## 2017-12-04 NOTE — ED Provider Notes (Signed)
[facility] EMERGENCY DEPARTMENT Provider Note   CSN: [id number] Arrival date & time: 12/04/17  0240     History   Chief Complaint Chief Complaint  Patient presents with  . Dysuria    HPI Juan [name] is a 77 y.o. male.  Patient presents to the emergency department with complaints of dysuria.  Patient reports his symptoms have been ongoing for several days.  He has not had any fever, nausea, vomiting.  He reports that he has had recurrent UTIs recently and this feels similar.  No back or flank pain.       Past Medical History:  Diagnosis Date  . Allergic rhinitis   . Anemia   . Ascending aortic aneurysm ([place]) 03/2014   4.3cm on CT scan  . CAD (coronary artery disease)    dx elsewheer in past, no documentation. Non-ischemic myoview 2007  . Chronic diastolic CHF (congestive heart failure), NYHA class 2 (HCC)    Normal EF w/ grade 1 dd by echo 12/2015  . Chronic respiratory failure with hypoxia/2L at home    "2L; all the time" (05/03/2017)  . CKD (chronic kidney disease), stage III ([place])   . Edema    R>L leg, u/s 5-12 neg for DVT  . Hemorrhoid   . History of hydronephrosis   . History of Splenic infarct   . History of thrombocytosis   . Hypertension   . Iron deficiency anemia due to chronic blood loss 03/31/2017  . 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 Migraine    "once/wk at least" (07/11/2013)  . Moderate to severe pulmonary hypertension ([place])   . 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 Pyelonephritis 04/29/2017  . Recurrent Pseudomonas urinary tract infection   . Shortness of breath   . Sinus congestion   . Sleep apnea, obstructive    at some point used CPAP, was d/c  years ago  . Type II diabetes mellitus ([place])   . UTI (urinary tract infection) 06/2017  . UTI (urinary tract infection) 07/2017    Patient Active Problem List   Diagnosis Date Noted  . Facial cellulitis 09/24/2017  . ARF (acute renal failure) ([place]) 09/24/2017  . UTI (urinary tract  infection) 09/05/2017  . VRE (vancomycin resistant enterococcus) culture positive   . Staphylococcus epidermidis infection   . Acute encephalopathy 08/18/2017  . Sleep apnea, obstructive 08/18/2017  . 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 History of recurrent Pseudomonas urinary tract infection 08/18/2017  . Moderate to severe pulmonary hypertension ([place]) 08/18/2017  . CKD (chronic kidney disease), stage III ([place]) 08/18/2017  . Chronic respiratory failure with hypoxia ([place]) 08/18/2017  . Chronic diastolic CHF (congestive heart failure), NYHA class 2 ([place]) 08/18/2017  . 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 HTN (hypertension) 08/18/2017  . Evaluation by psychiatric service required   . Pyelonephritis 07/17/2017  . Recurrent UTI 07/16/2017  . Acute exacerbation of CHF (congestive heart failure) ([place]) 07/06/2017  . Complicated UTI (urinary tract infection) 07/06/2017  . Acute GI bleeding 06/07/2017  . Hypokalemia   . Iron deficiency anemia due to chronic blood loss 03/31/2017  . 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 Urinary retention 01/24/2017  . 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 Elevated troponin 11/14/2016  . Recurrent pneumonia 10/31/2016  . Pulmonary hypertension severe   . Sprain of left rotator cuff capsule   . Hyperkalemia   . Paroxysmal SVT (supraventricular tachycardia) ([place]) 04/20/2016  . Type II diabetes mellitus ([place]) 04/20/2016  . Gout 04/20/2016  .  B12 deficiency 04/20/2016  . 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 Hydronephrosis   . Thrombocytosis ([place]) 04/07/2016  . Pressure ulcer stage II 12/31/2015  . T wave inversion in EKG 12/29/2015  . PCP NOTES >>>>>>>>>>>>>>>>> 05/02/2015  . Peripheral edema   . Thoracic aortic aneurysm ([place]) 04/26/2014  . CKD (chronic kidney disease) stage 3, GFR 30-59 ml/min (HCC) 04/26/2014  . Generalized weakness 04/06/2014  . Venous stasis dermatitis of both lower  extremities   . Morbid obesity due to excess calories ([place]) 03/29/2014  . 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 Leukocytosis 10/17/2013  . Poor compliance with advise  05/05/2012  . Annual physical exam 01/14/2012  . Weight loss 01/14/2012  . BPH (benign prostatic hyperplasia) 01/14/2012  . 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 Obstructive sleep apnea 09/07/2006  . 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 Coronary atherosclerosis 09/07/2006  . Chronic diastolic CHF (congestive heart failure) ([place]) 09/07/2006  . Allergic rhinitis 09/07/2006    Past Surgical History:  Procedure Laterality Date  . FLEXIBLE SIGMOIDOSCOPY N/A 06/08/2017   Procedure: FLEXIBLE SIGMOIDOSCOPY;  Surgeon: Carol Ada, MD;  Location: [facility];  Service: Endoscopy;  Laterality: N/A;  . IR FLUORO GUIDE CV LINE RIGHT  05/07/2017  . IR FLUORO RM 30-60 MIN  05/07/2017  . IR US GUIDE VASC ACCESS RIGHT  05/07/2017  . TOE SURGERY Right    "tried to straighten out big toe" (07/11/2013)        Home Medications    Prior to Admission medications   Medication Sig Start Date End Date Taking? Authorizing Provider  acetaminophen (TYLENOL) 325 MG tablet Take 2 tablets (650 mg total) by mouth every 6 (six) hours as needed for mild pain (or Fever >/= 101). 05/08/17   Rai, Ripudeep K, MD  amLODipine (NORVASC) 5 MG tablet Take 1 tablet (5 mg total) by mouth daily. For BP 11/30/17   Bensimhon, Shaune Pascal, MD  amoxicillin-clavulanate (AUGMENTIN) 875-125 MG tablet Take 1 tablet by mouth every 12 (twelve) hours. 09/26/17   Shelly Coss, MD  aspirin EC 81 MG tablet Take 81 mg by mouth daily.    [provider]  b complex vitamins tablet Take 1 tablet by mouth daily.    [provider]  benzonatate (TESSALON) 100 MG capsule Take by mouth 3 (three) times daily as needed for cough.    [provider]  cephALEXin (KEFLEX) 500 MG  capsule Take 1 capsule (500 mg total) by mouth 2 (two) times daily. 12/04/17   Orpah Greek, MD  clotrimazole (LOTRIMIN AF) 1 % cream Apply to Lt foot (between) Toes bid for athletes foot 09/12/17   Roxan Hockey, MD  ferrous gluconate (FERGON) 240 (27 FE) MG tablet Take 240 mg by mouth daily.    [provider]  furosemide (LASIX) 40 MG tablet Take 40mg  (1 tab) in morning and 40mg  (1tab) in evening 11/30/17   Bensimhon, Shaune Pascal, MD  hydrALAZINE (APRESOLINE) 50 MG tablet Take 1.5 tablets (75 mg total) by mouth 3 (three) times daily. 11/30/17   Bensimhon, Shaune Pascal, MD  levofloxacin (LEVAQUIN) 750 MG tablet Take 1 tablet (750 mg total) by mouth daily. X 7 days 10/21/17   Veryl Speak, MD  miconazole (MICOTIN) 2 % powder Apply topically as needed for itching.    [provider]  mouth rinse LIQD solution 15 mLs by Mouth Rinse route 2 (two) times daily. 09/26/17   Shelly Coss,  MD  ondansetron (ZOFRAN) 4 MG tablet Take 1 tablet (4 mg total) by mouth every 6 (six) hours as needed for nausea or vomiting. 09/12/17   Roxan Hockey, MD  OXYGEN Inhale 2 L into the lungs continuous.    [provider]  tamsulosin (FLOMAX) 0.4 MG CAPS capsule Take 1 capsule (0.4 mg total) by mouth daily after supper. 06/13/17   Bonnielee Haff, MD    Family History Family History  Problem Relation Age of Onset  . Schizophrenia Son   . Mental illness Daughter   . Colon cancer Neg Hx   . Prostate cancer Neg Hx   . Heart attack Neg Hx   . Diabetes Neg Hx     Social History Social History   Tobacco Use  . Smoking status: Former Smoker    Packs/day: 0.25    Years: 12.00    Pack years: 3.00    Types: Cigarettes    Last attempt to quit: 05/18/1966    Years since quitting: 51.5  . Smokeless tobacco: Never Used  . Tobacco comment: quit smoking 45 years ago  Substance Use Topics  . Alcohol use: No    Alcohol/week: 0.0 oz  . Drug use: No     Allergies   Patient has no known  allergies.   Review of Systems Review of Systems  Genitourinary: Positive for dysuria.  All other systems reviewed and are negative.    Physical Exam Updated Vital Signs BP (!) 159/70   Pulse 67   Temp 99 F (37.2 C) (Oral)   Resp 16   Ht 6\' 3"  (1.905 m)   Wt 83.9 kg (185 lb)   SpO2 98%   BMI 23.12 kg/m   Physical Exam  Constitutional: He is oriented to person, place, and time. He appears well-developed and well-nourished. No distress.  HENT:  Head: Normocephalic and atraumatic.  Right Ear: Hearing normal.  Left Ear: Hearing normal.  Nose: Nose normal.  Mouth/Throat: Oropharynx is clear and moist and mucous membranes are normal.  Eyes: Pupils are equal, round, and reactive to light. Conjunctivae and EOM are normal.  Neck: Normal range of motion. Neck supple.  Cardiovascular: Regular rhythm, S1 normal and S2 normal. Exam reveals no gallop and no friction rub.  No murmur heard. Pulmonary/Chest: Effort normal and breath sounds normal. No respiratory distress. He exhibits no tenderness.  Abdominal: Soft. Normal appearance and bowel sounds are normal. There is no hepatosplenomegaly. There is no tenderness. There is no rebound, no guarding, no tenderness at McBurney's point and negative Murphy's sign. No hernia.  Musculoskeletal: Normal range of motion.  Neurological: He is alert and oriented to person, place, and time. He has normal strength. No cranial nerve deficit or sensory deficit. Coordination normal. GCS eye subscore is 4. GCS verbal subscore is 5. GCS motor subscore is 6.  Skin: Skin is warm, dry and intact. No rash noted. No cyanosis.  Psychiatric: He has a normal mood and affect. His speech is normal and behavior is normal. Thought content normal.  Nursing note and vitals reviewed.    ED Treatments / Results  Labs (all labs ordered are listed, but only abnormal results are displayed) Labs Reviewed  URINALYSIS, ROUTINE W REFLEX MICROSCOPIC - Abnormal; Notable for  the following components:      Result Value   APPearance HAZY (*)    Protein, ur 100 (*)    Nitrite POSITIVE (*)    Leukocytes, UA LARGE (*)    WBC, UA >50 (*)  Bacteria, UA MANY (*)    All other components within normal limits  URINE CULTURE    EKG None  Radiology No results found.  Procedures Procedures (including critical care time)  Medications Ordered in ED Medications - No data to display   Initial Impression / Assessment and Plan / ED Course  I have reviewed the triage vital signs and the nursing notes.  Pertinent labs & imaging results that were available during my care of the patient were reviewed by me and considered in my medical decision making (see chart for details).     Patient with history of recurrent UTI presents to the ER for dysuria.  He is afebrile, vital signs are unremarkable.  No signs of sepsis.  He appears well.  Appropriate for outpatient treatment.   Final Clinical Impressions(s) / ED Diagnoses   Final diagnoses:  Urinary tract infection without hematuria, site unspecified    ED Discharge Orders        Ordered    cephALEXin (KEFLEX) 500 MG capsule  2 times daily     12/04/17 0645       Orpah Greek, MD 12/04/17 [phone]

## 2017-12-06 LAB — URINE CULTURE

## 2017-12-07 ENCOUNTER — Telehealth: Payer: Self-pay | Admitting: Emergency Medicine

## 2017-12-07 NOTE — Progress Notes (Signed)
ED Antimicrobial Stewardship Positive Culture Follow Up   Juan [name] is an 77 y.o. male who presented to [facility] on 12/04/2017 with a chief complaint of  Chief Complaint  Patient presents with  . Dysuria    Recent Results (from the past 720 hour(s))  Urine Culture     Status: Abnormal   Collection Time: 12/04/17  6:30 AM  Result Value Ref Range Status   Specimen Description URINE, CLEAN CATCH  Final   Special Requests   Final    NONE Performed at [facility], [address], [place], [place] [address]    Culture >=100,000 COLONIES/mL PSEUDOMONAS AERUGINOSA (A)  Final   Report Status 12/06/2017 FINAL  Final   Organism ID, Bacteria PSEUDOMONAS AERUGINOSA (A)  Final      Susceptibility   Pseudomonas aeruginosa - MIC*    CEFTAZIDIME 4 SENSITIVE Sensitive     CIPROFLOXACIN >=4 RESISTANT Resistant     GENTAMICIN 4 SENSITIVE Sensitive     IMIPENEM 2 SENSITIVE Sensitive     PIP/TAZO 32 SENSITIVE Sensitive     CEFEPIME 8 SENSITIVE Sensitive     * >=100,000 COLONIES/mL PSEUDOMONAS AERUGINOSA    [x]  Treated with cephalexin, organism resistant to prescribed antimicrobial []  Patient discharged originally without antimicrobial agent and treatment is now indicated  New antibiotic prescription: DC cephalexin, start cefdinir 300mg  PO BID x 10 days  ED Provider: Suella Broad, PA   [name], [name] 12/07/2017, 9:28 AM Clinical Pharmacist Monday - Friday phone -  [phone] Saturday - Sunday phone - [phone]

## 2017-12-07 NOTE — Telephone Encounter (Signed)
Post ED Visit - Positive Culture Follow-up: Successful Patient Follow-Up  Culture assessed and recommendations reviewed by:  []  Elenor Quinones, Pharm.D. []  Heide Guile, Pharm.D., BCPS AQ-ID []  Parks Neptune, Pharm.D., BCPS []  Alycia Rossetti, Pharm.D., BCPS []  [place], Pharm.D., BCPS, AAHIVP []  Legrand Como, Pharm.D., BCPS, AAHIVP [x]  Salome Arnt, PharmD, BCPS []  Johnnette Gourd, PharmD, BCPS []  Hughes Better, PharmD, BCPS []  Leeroy Cha, PharmD  Positive urine culture  [x]  Patient discharged without antimicrobial prescription and treatment is now indicated []  Organism is resistant to prescribed ED discharge antimicrobial []  Patient with positive blood cultures  Changes discussed with ED provider: Suella Broad PA New antibiotic prescription start cefdinir 300mg  po bid x 10 days Called to [place] patient, 12/07/2017 1355   Hazle Nordmann 12/07/2017, 1:53 PM

## 2017-12-08 NOTE — Progress Notes (Signed)
Patient ID: Juan [name], male   DOB: [birth year], 77 y.o.   MRN: [id number]    Advanced Heart Failure Clinic Note   PCP: Dr Larose Kells  Primary HF Cardiologist: Dr Haroldine Laws  Primary Cardiologist: Dr Johnsie Cancel   HPI: Juan [name] is a 77 y.o. male with history of Diastolic HF, OSA, HTN, DM2, and myeloproliferative disorder followed by Oncology.   He was seen by onc 10/22/14 for his myeloproliferative disorder. 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   Admitted to [facility] 01/2015 with increased dyspnea and volume overload. Diuresed with lasix and transitioned to lasix 40 mg three times a day.   Admitted twice in  December 2017 with volume overload.  Diuresed with IV lasix the transitioned to lasix 40 mg twice a day. Discharge weight 198 pounds. Discharged to SNF.    Admitted 10/08/16-10/17/16 with volume overload. PA pressure on Echo was 85 mmHg. This was a newly high PA pressure for him. VQ scan negative for PE. ANA, RA and HIV negative. He was diuresed with IV lasix about 15 pounds. Discharge weight was 194 pounds. It was recommended that he go to SNF, but he preferred to go home with home health. He also was started on home 02 at 2L.   Admitted earlier 12/2016 with hip and flank pain. Torsemide and metolazone held at discharge. Per note because patient "wasn't in a CHF flare".   Admitted 01/23/17-01/26/17 with hip, back and foot pain. 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 Dr. Marin Olp saw in the hospital and [name] was started. Weight was down to 185 pounds, his lasix was stopped at discharge.   Admitted 05/2017 with lower GI bleed. Had scope with clip placed. Aspirin stopped. Discharged on lasix 40 mg daily. He refused SNF placement.   He has had 6 admission since November 7989  for complicated UTI (1 for GI bleed). He has an indwelling foley and urology follow up. Recently refused PICC line so received one week of daptomycin as inpatient. He was previously in SNF in December, but demanded he be sent home due to PT/OT requirement. He has since refused SNF follow up.   He presents today for HF follow up. Last visit hydralazine was increased. He was seen in the ED twice since last appointment with UTI symptoms. Overall doing fine. He is on Keflex now for recurrent UTI. He no longer has a foley. Urinary symptoms are improved. He is a little SOB with walking around the house, but not with ADLs. Denies orthopnea. He has chronic BLE edema. No CP. He has occasional dizziness that occurs at rest or while walking about once/week. Weights stable 182 lbs at home. Does not miss any medication doses, but hasn't taken yet today. Limits fluid and salt. Son cooks and does not add salt.  Echo 01/21/15 EF 30%, moderate LVH, mild MR, PA peak pressure 59 mm Hg, Normal RV ECHO 12/2015: Ef 55-60%. Grade IDD Echo 09/2016 EF 55-60%, PA pressure 85 mm Hg.   Review of systems complete and found to be negative unless listed in HPI.   SH:  Social History   Socioeconomic History  . Marital status: Widowed    Spouse name: Not on file  . Number of children: 2  . Years of education: Not on file  . Highest education  level: Not on file  Occupational History  . Occupation: retired     Fish farm manager: RETIRED  Social Needs  . Financial resource strain: Not on file  . Food insecurity:    Worry: Not on file    Inability: Not on file  . Transportation needs:    Medical: Not on file    Non-medical: Not on file  Tobacco Use  . Smoking status: Former Smoker    Packs/day: 0.25    Years: 12.00    Pack years: 3.00    Types: Cigarettes    Last attempt to quit: 05/18/1966    Years since quitting: 51.5  . Smokeless tobacco: Never Used  . Tobacco comment: quit smoking 45 years ago  Substance and Sexual Activity   . Alcohol use: No    Alcohol/week: 0.0 oz  . Drug use: No  . Sexual activity: Yes  Lifestyle  . Physical activity:    Days per week: Not on file    Minutes per session: Not on file  . Stress: Not on file  Relationships  . Social connections:    Talks on phone: Not on file    Gets together: Not on file    Attends religious service: Not on file    Active member of club or organization: Not on file    Attends meetings of clubs or organizations: Not on file    Relationship status: Not on file  . Intimate partner violence:    Fear of current or ex partner: Not on file    Emotionally abused: Not on file    Physically abused: Not on file    Forced sexual activity: Not on file  Other Topics Concern  . Not on file  Social History Narrative   Moved from [place] 2006   Widow 2007   Son lives with him       Admitted to [facility] 05/08/17   Former smoker-stopped 1968   Alcohol none   Full Code   FH: Family History  Problem Relation Age of Onset  . Schizophrenia Son   . Mental illness Daughter   . Colon cancer Neg Hx   . Prostate cancer Neg Hx   . Heart attack Neg Hx   . Diabetes Neg Hx     Past Medical History:  Diagnosis Date  . Allergic rhinitis   . Anemia   . Ascending aortic aneurysm ([place]) 03/2014   4.3cm on CT scan  . CAD (coronary artery disease)    dx elsewheer in past, no documentation. Non-ischemic myoview 2007  . Chronic diastolic CHF (congestive heart failure), NYHA class 2 (HCC)    Normal EF w/ grade 1 dd by echo 12/2015  . Chronic respiratory failure with hypoxia/2L at home    "2L; all the time" (05/03/2017)  . CKD (chronic kidney disease), stage III ([place])   . Edema    R>L leg, u/s 5-12 neg for DVT  . Hemorrhoid   . History of hydronephrosis   . History of Splenic infarct   . History of thrombocytosis   . Hypertension   . Iron deficiency anemia due to chronic blood loss 03/31/2017  . 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 Migraine     "once/wk at least" (07/11/2013)  . Moderate to severe pulmonary hypertension ([place])   . Myeloproliferative disease ([place])   . Pyelonephritis 04/29/2017  . Recurrent Pseudomonas urinary tract infection   . Shortness of breath   . Sinus congestion   . Sleep  apnea, obstructive    at some point used CPAP, was d/c  years ago  . Type II diabetes mellitus ([place])   . UTI (urinary tract infection) 06/2017  . UTI (urinary tract infection) 07/2017    Current Outpatient Medications  Medication Sig Dispense Refill  . acetaminophen (TYLENOL) 325 MG tablet Take 2 tablets (650 mg total) by mouth every 6 (six) hours as needed for mild pain (or Fever >/= 101).    Marland Kitchen amLODipine (NORVASC) 5 MG tablet Take 1 tablet (5 mg total) by mouth daily. For BP 30 tablet 2  . aspirin EC 81 MG tablet Take 81 mg by mouth daily.    Marland Kitchen b complex vitamins tablet Take 1 tablet by mouth daily.    . benzonatate (TESSALON) 100 MG capsule Take by mouth 3 (three) times daily as needed for cough.    . cephALEXin (KEFLEX) 500 MG capsule Take 1 capsule (500 mg total) by mouth 2 (two) times daily. 14 capsule 0  . clotrimazole (LOTRIMIN AF) 1 % cream Apply to Lt foot (between) Toes bid for athletes foot 28 g 0  . ferrous gluconate (FERGON) 240 (27 FE) MG tablet Take 240 mg by mouth daily.    . furosemide (LASIX) 40 MG tablet Take 52m (1 tab) in morning and 440m(1tab) in evening 1 tablet 0  . hydrALAZINE (APRESOLINE) 50 MG tablet Take 1.5 tablets (75 mg total) by mouth 3 (three) times daily. 135 tablet 2  . miconazole (MICOTIN) 2 % powder Apply topically as needed for itching.    . mouth rinse LIQD solution 15 mLs by Mouth Rinse route 2 (two) times daily. 118 mL 0  . ondansetron (ZOFRAN) 4 MG tablet Take 1 tablet (4 mg total) by mouth every 6 (six) hours as needed for nausea or vomiting. 10 tablet 0  . OXYGEN Inhale 2 L into the lungs continuous.    . tamsulosin (FLOMAX) 0.4 MG CAPS capsule Take 1 capsule (0.4 mg total) by mouth daily  after supper. 30 capsule 0   No current facility-administered medications for this encounter.    Vitals:   12/09/17 1032  BP: (!) 158/80  Pulse: (!) 57  SpO2: 95%  Weight: 182 lb 3.2 oz (82.6 kg)     Wt Readings from Last 3 Encounters:  12/09/17 182 lb 3.2 oz (82.6 kg)  12/04/17 185 lb (83.9 kg)  10/14/17 180 lb (81.6 kg)    PHYSICAL EXAM:  General: Elderly. Chronically ill appearing No resp difficulty. Arrived in wheelchair.  HEENT: Normal Neck: Supple. JVP 6-7. Carotids 2+ bilat; no bruits. No thyromegaly or nodule noted. Cor: PMI nondisplaced. RRR, No M/G/R noted Lungs: CTAB, normal effort. Abdomen: Soft, non-tender, non-distended, no HSM. No bruits or masses. +BS  Extremities: No cyanosis, clubbing, or rash. R and LLE 1+ ankle edema Neuro: Alert & orientedx3, cranial nerves grossly intact. moves all 4 extremities w/o difficulty. Affect pleasant   ASSESSMENT & PLAN: 1. Chronic combined systolic and diastolic CHF, EF 3023%cho 01/21/15, Normal myoview 2007.  EF 55-60% in 09/2016. - NYHA III symptoms. Not very active.  - Volume status stable on exam.  - Continue lasix 80 mg am, 40 mg pm. Should take extra tab in evening as needed.  - He continues to refuse any help in the home.  - Reinforced fluid restriction to < 2 L daily, sodium restriction to less than 2000 mg daily, and the importance of daily weights.   - HF paramedicine delivers his medications -  Set up repeat echo.   2. CKD stage III - BMET today.   3. HTN - Continue amlodipine 5 mg daily.  - Continue hydralazine to 75 mg TID.  - Elevated today but has not taken medications yet. He agrees to let Thedore Mins come to his house and check BP.   4. 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 - Now seeing Dr. Marin Olp. 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  - No change   5. H/O GI Bleed  - Denies bleeding, but requests CBC today. - S/P sigmoidoscopy with clip  placed 05/2017   6. Chronic urinary retention - Admitted 08/2017 for UTI.  - Frequent ED visits for urinary symptoms. Currently on Keflex for UTI. Symptoms improved. No longer has a foley.   BMET, CBC today Juan [name] with HF paramedicine to check on home BP. Pt agreeable.  Follow up in 2 months Set up repeat echo.   Georgiana Shore, NP  12/09/17   Greater than 50% of the 25 minute visit was spent in counseling/coordination of care regarding disease state education, salt/fluid restriction, sliding scale diuretics, and medication compliance.

## 2017-12-09 ENCOUNTER — Ambulatory Visit (HOSPITAL_COMMUNITY)
Admission: RE | Admit: 2017-12-09 | Discharge: 2017-12-09 | Disposition: A | Discharge status: Home / Self Care | Payer: Medicare Other | Source: Ambulatory Visit | Attending: Internal Medicine | Admitting: Internal Medicine

## 2017-12-09 VITALS — BP 158/80 | HR 57 | Wt 182.2 lb

## 2017-12-09 DIAGNOSIS — I5032 Chronic diastolic (congestive) heart failure: Secondary | ICD-10-CM

## 2017-12-09 DIAGNOSIS — Z79899 Other long term (current) drug therapy: Secondary | ICD-10-CM | POA: Diagnosis not present

## 2017-12-09 DIAGNOSIS — I10 Essential (primary) hypertension: Secondary | ICD-10-CM | POA: Diagnosis not present

## 2017-12-09 DIAGNOSIS — I272 Pulmonary hypertension, unspecified: Secondary | ICD-10-CM | POA: Diagnosis not present

## 2017-12-09 DIAGNOSIS — Z87891 Personal history of nicotine dependence: Secondary | ICD-10-CM | POA: Diagnosis not present

## 2017-12-09 DIAGNOSIS — I2510 Atherosclerotic heart disease of native coronary artery without angina pectoris: Secondary | ICD-10-CM | POA: Diagnosis not present

## 2017-12-09 DIAGNOSIS — N390 Urinary tract infection, site not specified: Secondary | ICD-10-CM | POA: Diagnosis not present

## 2017-12-09 DIAGNOSIS — C946 Myelodysplastic disease, not classified: Secondary | ICD-10-CM | POA: Insufficient documentation

## 2017-12-09 DIAGNOSIS — Z87440 Personal history of urinary (tract) infections: Secondary | ICD-10-CM | POA: Insufficient documentation

## 2017-12-09 DIAGNOSIS — N183 Chronic kidney disease, stage 3 unspecified: Secondary | ICD-10-CM

## 2017-12-09 DIAGNOSIS — D471 Chronic myeloproliferative disease: Secondary | ICD-10-CM | POA: Insufficient documentation

## 2017-12-09 DIAGNOSIS — E1122 Type 2 diabetes mellitus with diabetic chronic kidney disease: Secondary | ICD-10-CM | POA: Diagnosis not present

## 2017-12-09 DIAGNOSIS — G4733 Obstructive sleep apnea (adult) (pediatric): Secondary | ICD-10-CM | POA: Insufficient documentation

## 2017-12-09 DIAGNOSIS — I5042 Chronic combined systolic (congestive) and diastolic (congestive) heart failure: Secondary | ICD-10-CM | POA: Diagnosis not present

## 2017-12-09 DIAGNOSIS — E8889 Other specified metabolic disorders: Secondary | ICD-10-CM | POA: Diagnosis not present

## 2017-12-09 DIAGNOSIS — I130 Hypertensive heart and chronic kidney disease with heart failure and stage 1 through stage 4 chronic kidney disease, or unspecified chronic kidney disease: Secondary | ICD-10-CM | POA: Insufficient documentation

## 2017-12-09 DIAGNOSIS — Z7982 Long term (current) use of aspirin: Secondary | ICD-10-CM | POA: Insufficient documentation

## 2017-12-09 DIAGNOSIS — Z8719 Personal history of other diseases of the digestive system: Secondary | ICD-10-CM | POA: Diagnosis not present

## 2017-12-09 LAB — BASIC METABOLIC PANEL
Anion gap: 11 (ref 5–15)
BUN: 70 mg/dL — ABNORMAL HIGH (ref 8–23)
CO2: 20 mmol/L — AB (ref 22–32)
Calcium: 9.8 mg/dL (ref 8.9–10.3)
Chloride: 109 mmol/L (ref 98–111)
Creatinine, Ser: 1.21 mg/dL (ref 0.61–1.24)
GFR calc Af Amer: >60 mL/min (ref >60)
GFR calc non Af Amer: 56 mL/min — ABNORMAL LOW (ref >60)
GLUCOSE: 104 mg/dL — AB (ref 70–99)
Potassium: 3.8 mmol/L (ref 3.5–5.1)
Sodium: 140 mmol/L (ref 135–145)

## 2017-12-09 LAB — CBC
HEMATOCRIT: 31.2 % — AB (ref 39.0–52.0)
HEMOGLOBIN: 9.4 g/dL — AB (ref 13.0–17.0)
MCH: 25.5 pg — AB (ref 26.0–34.0)
MCHC: 30.1 g/dL (ref 30.0–36.0)
MCV: 84.6 fL (ref 78.0–100.0)
Platelets: 142 10*3/uL — ABNORMAL LOW (ref 150–400)
RBC: 3.69 MIL/uL — ABNORMAL LOW (ref 4.22–5.81)
RDW: 18.8 % — ABNORMAL HIGH (ref 11.5–15.5)
WBC: 18.1 10*3/uL — ABNORMAL HIGH (ref 4.0–10.5)

## 2017-12-09 NOTE — Patient Instructions (Signed)
Routine lab work today. Will notify you of abnormal results, otherwise no news is good news!  Follow up 2 months.  _____________________________________________________________________ Juan [name] Code: 8022  Take all medication as prescribed the day of your appointment. Bring all medications with you to your appointment.  Do the following things EVERYDAY: 1) Weigh yourself in the morning before breakfast. Write it down and keep it in a log. 2) Take your medicines as prescribed 3) Eat low salt foods-Limit salt (sodium) to 2000 mg per day.  4) Stay as active as you can everyday 5) Limit all fluids for the day to less than 2 liters

## 2017-12-09 NOTE — Addendum Note (Signed)
Encounter addended by: Louann Liv, LCSW on: 12/09/2017 1:16 PM  Actions taken: Sign clinical note

## 2017-12-09 NOTE — Progress Notes (Signed)
CSW met with patient in the clinic to discuss Medicare D as patient is using the HF fund for medications. Patient states he is "waiting for the hospital to give him his disability before he can pay for his medications". CSW has attempted multiple times to explain the Medicare D process although patient reluctant as he fears giving his personal information may compromise his finances. CSW also explained that disability is through [employer] and he already receives his benefits. Patient adamantly refuses to believe anyone regarding prescription drug coverage as his fears of financial exploitation from the federal government are preventing him from applying. He is convinced that the "hospital owes me disability and waiting for that". CSW unable to successfully explain to patient about prescription drug coverage and therefore remains on HF fund. Patient is agreeable to continuing to allow the [facility] to make visits and deliver the prescriptions. CSW will discuss further with clinic administration about HF fund coverage. Raquel Sarna, [place], [place]

## 2017-12-15 ENCOUNTER — Telehealth (HOSPITAL_COMMUNITY): Payer: Self-pay

## 2017-12-15 NOTE — Telephone Encounter (Signed)
Result Notes for Basic metabolic panel   Notes recorded by Effie Berkshire, RN on 12/15/2017 at 3:02 PM EDT LVMTCB. Results forwarded to Dr. Marin Olp ------  Notes recorded by Georgiana Shore, NP on 12/09/2017 at 1:18 PM EDT Creatinine stable, but BUN is elevated. Denies any bleeding. WBC is elevated. He is currently being treated for UTI. He needs to follow up with PCP or urology for infection. Platelets are also low. He needs to follow up with his oncologist, Dr Marin Olp. Please call and make him aware. Thanks.

## 2017-12-22 ENCOUNTER — Telehealth: Payer: Self-pay | Admitting: Hematology & Oncology

## 2017-12-22 NOTE — Telephone Encounter (Signed)
SW pt atient re follow up appt 8/15 at 1130 am per sch msg. Pt stated he may have transportation issues will call back to confirm appt

## 2017-12-30 ENCOUNTER — Ambulatory Visit: Payer: Self-pay | Admitting: Hematology & Oncology

## 2017-12-30 ENCOUNTER — Other Ambulatory Visit: Payer: Self-pay

## 2017-12-31 ENCOUNTER — Telehealth (HOSPITAL_COMMUNITY): Payer: Self-pay

## 2017-12-31 MED FILL — FUROSEMIDE 40 MG TAB: 40 | 30 days supply | Qty: 90 | Fill #2

## 2017-12-31 MED FILL — AMLODIPINE BESYLATE 5 MG TA: 5 | 30 days supply | Qty: 30 | Fill #1

## 2017-12-31 MED FILL — hydrALAZINE HCL 50 MG TABS: 50 | 30 days supply | Qty: 135 | Fill #2

## 2017-12-31 NOTE — Telephone Encounter (Signed)
Pt called and stated he needed Zack to pick up his medication lasix and amlodipine.

## 2018-01-03 ENCOUNTER — Telehealth: Payer: Self-pay | Admitting: Licensed Clinical Social Worker

## 2018-01-03 NOTE — Telephone Encounter (Signed)
CSW received a message that patient called requesting assistance with his medications. Patient has been reluctant to allow the [facility] in the home although does allow them to pick up med's and deliver. CSW contacted katie from [employer] and asked for follow up assistance with patient. It I the hope that establishing trust and a relationship that patient will allow paramedic into the home for added support. CSW and [employer] will continue to follow. Raquel Sarna, [place], [place]

## 2018-01-06 ENCOUNTER — Emergency Department (HOSPITAL_COMMUNITY)
Admission: EM | Admit: 2018-01-06 | Discharge: 2018-01-07 | Disposition: A | Discharge status: Home / Self Care | Payer: Medicare Other | Attending: Emergency Medicine | Admitting: Emergency Medicine

## 2018-01-06 ENCOUNTER — Other Ambulatory Visit: Payer: Self-pay

## 2018-01-06 ENCOUNTER — Encounter (HOSPITAL_COMMUNITY): Payer: Self-pay | Admitting: Emergency Medicine

## 2018-01-06 DIAGNOSIS — R509 Fever, unspecified: Secondary | ICD-10-CM | POA: Diagnosis not present

## 2018-01-06 DIAGNOSIS — Z79899 Other long term (current) drug therapy: Secondary | ICD-10-CM | POA: Insufficient documentation

## 2018-01-06 DIAGNOSIS — I959 Hypotension, unspecified: Secondary | ICD-10-CM | POA: Diagnosis not present

## 2018-01-06 DIAGNOSIS — Z7982 Long term (current) use of aspirin: Secondary | ICD-10-CM | POA: Diagnosis not present

## 2018-01-06 DIAGNOSIS — E1122 Type 2 diabetes mellitus with diabetic chronic kidney disease: Secondary | ICD-10-CM | POA: Insufficient documentation

## 2018-01-06 DIAGNOSIS — N3001 Acute cystitis with hematuria: Secondary | ICD-10-CM | POA: Insufficient documentation

## 2018-01-06 DIAGNOSIS — I2510 Atherosclerotic heart disease of native coronary artery without angina pectoris: Secondary | ICD-10-CM | POA: Insufficient documentation

## 2018-01-06 DIAGNOSIS — R300 Dysuria: Secondary | ICD-10-CM | POA: Diagnosis present

## 2018-01-06 DIAGNOSIS — N183 Chronic kidney disease, stage 3 (moderate): Secondary | ICD-10-CM | POA: Diagnosis not present

## 2018-01-06 DIAGNOSIS — I10 Essential (primary) hypertension: Secondary | ICD-10-CM | POA: Diagnosis not present

## 2018-01-06 DIAGNOSIS — Z743 Need for continuous supervision: Secondary | ICD-10-CM | POA: Diagnosis not present

## 2018-01-06 DIAGNOSIS — I5032 Chronic diastolic (congestive) heart failure: Secondary | ICD-10-CM | POA: Insufficient documentation

## 2018-01-06 DIAGNOSIS — R0902 Hypoxemia: Secondary | ICD-10-CM | POA: Diagnosis not present

## 2018-01-06 DIAGNOSIS — I130 Hypertensive heart and chronic kidney disease with heart failure and stage 1 through stage 4 chronic kidney disease, or unspecified chronic kidney disease: Secondary | ICD-10-CM | POA: Diagnosis not present

## 2018-01-06 DIAGNOSIS — Z87891 Personal history of nicotine dependence: Secondary | ICD-10-CM | POA: Diagnosis not present

## 2018-01-06 DIAGNOSIS — D72829 Elevated white blood cell count, unspecified: Secondary | ICD-10-CM | POA: Diagnosis not present

## 2018-01-06 DIAGNOSIS — R1030 Lower abdominal pain, unspecified: Secondary | ICD-10-CM | POA: Diagnosis not present

## 2018-01-06 DIAGNOSIS — R279 Unspecified lack of coordination: Secondary | ICD-10-CM | POA: Diagnosis not present

## 2018-01-06 DIAGNOSIS — R1084 Generalized abdominal pain: Secondary | ICD-10-CM | POA: Diagnosis not present

## 2018-01-06 LAB — CBC WITH DIFFERENTIAL/PLATELET
BASOS ABS: 1 10*3/uL — AB (ref 0.0–0.1)
Basophils Relative: 5 %
Eosinophils Absolute: 0.2 10*3/uL (ref 0.0–0.7)
Eosinophils Relative: 1 %
HCT: 33.4 % — ABNORMAL LOW (ref 39.0–52.0)
Hemoglobin: 9.5 g/dL — ABNORMAL LOW (ref 13.0–17.0)
LYMPHS PCT: 13 %
Lymphs Abs: 2.5 10*3/uL (ref 0.7–4.0)
MCH: 25.6 pg — AB (ref 26.0–34.0)
MCHC: 28.4 g/dL — AB (ref 30.0–36.0)
MCV: 90 fL (ref 78.0–100.0)
MONO ABS: 4 10*3/uL — AB (ref 0.1–1.0)
Monocytes Relative: 21 %
NEUTROS ABS: 11.4 10*3/uL — AB (ref 1.7–7.7)
Neutrophils Relative %: 60 %
PLATELETS: 198 10*3/uL (ref 150–400)
RBC: 3.71 MIL/uL — ABNORMAL LOW (ref 4.22–5.81)
RDW: 18.1 % — AB (ref 11.5–15.5)
WBC: 19.1 10*3/uL — ABNORMAL HIGH (ref 4.0–10.5)

## 2018-01-06 LAB — URINALYSIS, ROUTINE W REFLEX MICROSCOPIC
Bilirubin Urine: NEGATIVE
Glucose, UA: NEGATIVE mg/dL
Ketones, ur: NEGATIVE mg/dL
Nitrite: POSITIVE — AB
PH: 6 (ref 5.0–8.0)
Protein, ur: 100 mg/dL — AB
SPECIFIC GRAVITY, URINE: 1.008 (ref 1.005–1.030)
WBC, UA: >50 WBC/hpf — ABNORMAL HIGH (ref 0–5)

## 2018-01-06 LAB — COMPREHENSIVE METABOLIC PANEL
ALK PHOS: 100 U/L (ref 38–126)
ALT: 26 U/L (ref 0–44)
ANION GAP: 10 (ref 5–15)
AST: 28 U/L (ref 15–41)
Albumin: 3.4 g/dL — ABNORMAL LOW (ref 3.5–5.0)
BUN: 59 mg/dL — ABNORMAL HIGH (ref 8–23)
CALCIUM: 10.1 mg/dL (ref 8.9–10.3)
CO2: 26 mmol/L (ref 22–32)
CREATININE: 1.36 mg/dL — AB (ref 0.61–1.24)
Chloride: 103 mmol/L (ref 98–111)
GFR calc non Af Amer: 49 mL/min — ABNORMAL LOW (ref >60)
GFR, EST AFRICAN AMERICAN: 57 mL/min — AB (ref >60)
GLUCOSE: 104 mg/dL — AB (ref 70–99)
Potassium: 4.7 mmol/L (ref 3.5–5.1)
Sodium: 139 mmol/L (ref 135–145)
Total Bilirubin: 0.6 mg/dL (ref 0.3–1.2)
Total Protein: 6.5 g/dL (ref 6.5–8.1)

## 2018-01-06 LAB — I-STAT CG4 LACTIC ACID, ED: Lactic Acid, Venous: 0.74 mmol/L (ref 0.5–1.9)

## 2018-01-06 MED ORDER — CEPHALEXIN 500 MG PO CAPS: 500.0000 mg | ORAL_CAPSULE | Freq: Two times a day (BID) | ORAL | 0 refills | Status: AC

## 2018-01-06 MED ORDER — CEPHALEXIN 250 MG PO CAPS
500.0000 mg | ORAL_CAPSULE | Freq: Once | ORAL | Status: AC
  Administered 2018-01-06: 500 mg via ORAL
  Filled 2018-01-06: qty 2

## 2018-01-06 NOTE — ED Provider Notes (Signed)
[facility] EMERGENCY DEPARTMENT Provider Note   CSN: [id number] Arrival date & time: 01/06/18  1834     History   Chief Complaint Chief Complaint  Patient presents with  . Recurrent UTI    HPI Juan [name] is a 77 y.o. male with a hx of recurrent UTIs (w/ pseudomonas), prior pyelonephritis, anemia, CAD, CHF, stage III CKD, splenic infarct, T2DM, BPH, oxygen dependence, and sleep apnea who presents to the emergency department today with complaints of urinary sxs over the past 3 days.  Patient states that he is having dysuria, urinary frequency, and urgency.  He reports suprapubic pressure as well as associated chills and subjective fevers at home, he has not taken his temperature.  He states he generally does not feel well.  States this feels similar to previous UTIs.  No specific alleviating or aggravating factors.  Has tried Tylenol at home without significant change, most recently this morning.  Denies nausea, vomiting, confusion, flank/back pain, or testicular pain/swelling.  HPI  Past Medical History:  Diagnosis Date  . Allergic rhinitis   . Anemia   . Ascending aortic aneurysm ([place]) 03/2014   4.3cm on CT scan  . CAD (coronary artery disease)    dx elsewheer in past, no documentation. Non-ischemic myoview 2007  . Chronic diastolic CHF (congestive heart failure), NYHA class 2 (HCC)    Normal EF w/ grade 1 dd by echo 12/2015  . Chronic respiratory failure with hypoxia/2L at home    "2L; all the time" (05/03/2017)  . CKD (chronic kidney disease), stage III ([place])   . Edema    R>L leg, u/s 5-12 neg for DVT  . Hemorrhoid   . History of hydronephrosis   . History of Splenic infarct   . History of thrombocytosis   . Hypertension   . Iron deficiency anemia due to chronic blood loss 03/31/2017  . 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 Migraine    "once/wk at least" (07/11/2013)  . Moderate to severe pulmonary hypertension ([place])   . Myeloproliferative disease ([place])     . Pyelonephritis 04/29/2017  . Recurrent Pseudomonas urinary tract infection   . Shortness of breath   . Sinus congestion   . Sleep apnea, obstructive    at some point used CPAP, was d/c  years ago  . Type II diabetes mellitus ([place])   . UTI (urinary tract infection) 06/2017  . UTI (urinary tract infection) 07/2017    Patient Active Problem List   Diagnosis Date Noted  . Facial cellulitis 09/24/2017  . ARF (acute renal failure) ([place]) 09/24/2017  . UTI (urinary tract infection) 09/05/2017  . VRE (vancomycin resistant enterococcus) culture positive   . Staphylococcus epidermidis infection   . Acute encephalopathy 08/18/2017  . Sleep apnea, obstructive 08/18/2017  . 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 History of recurrent Pseudomonas urinary tract infection 08/18/2017  . Moderate to severe pulmonary hypertension ([place]) 08/18/2017  . CKD (chronic kidney disease), stage III ([place]) 08/18/2017  . Chronic respiratory failure with hypoxia ([place]) 08/18/2017  . Chronic diastolic CHF (congestive heart failure), NYHA class 2 ([place]) 08/18/2017  . 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 HTN (hypertension) 08/18/2017  . Evaluation by psychiatric service required   . Pyelonephritis 07/17/2017  . Recurrent UTI 07/16/2017  . Acute exacerbation of CHF (congestive heart failure) ([place]) 07/06/2017  . Complicated UTI (urinary tract infection) 07/06/2017  . Acute GI bleeding 06/07/2017  . Hypokalemia   . Iron deficiency anemia due to chronic blood  loss 03/31/2017  . 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 Urinary retention 01/24/2017  . 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 Elevated troponin 11/14/2016  . Recurrent pneumonia 10/31/2016  . Pulmonary hypertension severe   . Sprain of left rotator cuff capsule   . Hyperkalemia   . Paroxysmal SVT (supraventricular tachycardia) ([place]) 04/20/2016  . Type II diabetes mellitus ([place]) 04/20/2016  . Gout 04/20/2016   . B12 deficiency 04/20/2016  . 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 Hydronephrosis   . Thrombocytosis ([place]) 04/07/2016  . Pressure ulcer stage II 12/31/2015  . T wave inversion in EKG 12/29/2015  . PCP NOTES >>>>>>>>>>>>>>>>> 05/02/2015  . Peripheral edema   . Thoracic aortic aneurysm ([place]) 04/26/2014  . CKD (chronic kidney disease) stage 3, GFR 30-59 ml/min (HCC) 04/26/2014  . Generalized weakness 04/06/2014  . Venous stasis dermatitis of both lower extremities   . Morbid obesity due to excess calories ([place]) 03/29/2014  . 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 Leukocytosis 10/17/2013  . Poor compliance with advise  05/05/2012  . Annual physical exam 01/14/2012  . Weight loss 01/14/2012  . BPH (benign prostatic hyperplasia) 01/14/2012  . Myeloproliferative disease ([place]) 09/07/2006  . Obstructive sleep apnea 09/07/2006  . Hypertensive heart disease with chronic diastolic congestive heart failure ([place]) 09/07/2006  . Coronary atherosclerosis 09/07/2006  . Chronic diastolic CHF (congestive heart failure) ([place]) 09/07/2006  . Allergic rhinitis 09/07/2006    Past Surgical History:  Procedure Laterality Date  . FLEXIBLE SIGMOIDOSCOPY N/A 06/08/2017   Procedure: FLEXIBLE SIGMOIDOSCOPY;  Surgeon: Carol Ada, MD;  Location: [facility];  Service: Endoscopy;  Laterality: N/A;  . IR FLUORO GUIDE CV LINE RIGHT  05/07/2017  . IR FLUORO RM 30-60 MIN  05/07/2017  . IR US GUIDE VASC ACCESS RIGHT  05/07/2017  . TOE SURGERY Right    "tried to straighten out big toe" (07/11/2013)        Home Medications    Prior to Admission medications   Medication Sig Start Date End Date Taking? Authorizing Provider  acetaminophen (TYLENOL) 325 MG tablet Take 2 tablets (650 mg total) by mouth every 6 (six) hours as needed for mild pain (or Fever >/= 101). 05/08/17   Rai, Ripudeep K, MD  amLODipine (NORVASC) 5 MG tablet Take 1 tablet (5 mg total) by mouth daily. For BP  11/30/17   Bensimhon, Shaune Pascal, MD  aspirin EC 81 MG tablet Take 81 mg by mouth daily.    [provider]  b complex vitamins tablet Take 1 tablet by mouth daily.    [provider]  benzonatate (TESSALON) 100 MG capsule Take by mouth 3 (three) times daily as needed for cough.    [provider]  cephALEXin (KEFLEX) 500 MG capsule Take 1 capsule (500 mg total) by mouth 2 (two) times daily. 12/04/17   Orpah Greek, MD  clotrimazole (LOTRIMIN AF) 1 % cream Apply to Lt foot (between) Toes bid for athletes foot 09/12/17   Roxan Hockey, MD  ferrous gluconate (FERGON) 240 (27 FE) MG tablet Take 240 mg by mouth daily.    [provider]  furosemide (LASIX) 40 MG tablet Take 40mg  (1 tab) in morning and 40mg  (1tab) in evening Patient taking differently: Take 80 mg (2 tab) in morning and 40mg  (1tab) in evening 11/30/17   Bensimhon, Shaune Pascal, MD  hydrALAZINE (APRESOLINE) 50 MG tablet Take 1.5 tablets (75 mg total) by mouth 3 (three) times daily. 11/30/17  Bensimhon, Shaune Pascal, MD  miconazole (MICOTIN) 2 % powder Apply topically as needed for itching.    [provider]  mouth rinse LIQD solution 15 mLs by Mouth Rinse route 2 (two) times daily. 09/26/17   Shelly Coss, MD  ondansetron (ZOFRAN) 4 MG tablet Take 1 tablet (4 mg total) by mouth every 6 (six) hours as needed for nausea or vomiting. 09/12/17   Roxan Hockey, MD  OXYGEN Inhale 2 L into the lungs continuous.    [provider]  tamsulosin (FLOMAX) 0.4 MG CAPS capsule Take 1 capsule (0.4 mg total) by mouth daily after supper. 06/13/17   Bonnielee Haff, MD    Family History Family History  Problem Relation Age of Onset  . Schizophrenia Son   . Mental illness Daughter   . Colon cancer Neg Hx   . Prostate cancer Neg Hx   . Heart attack Neg Hx   . Diabetes Neg Hx     Social History Social History   Tobacco Use  . Smoking status: Former Smoker    Packs/day: 0.25    Years:  12.00    Pack years: 3.00    Types: Cigarettes    Last attempt to quit: 05/18/1966    Years since quitting: 51.6  . Smokeless tobacco: Never Used  . Tobacco comment: quit smoking 45 years ago  Substance Use Topics  . Alcohol use: No    Alcohol/week: 0.0 standard drinks  . Drug use: No     Allergies   Patient has no known allergies.   Review of Systems Review of Systems  Constitutional: Positive for chills and fever (Subjective).  Respiratory: Negative for shortness of breath.   Cardiovascular: Negative for chest pain.  Gastrointestinal: Positive for abdominal pain (suprapubic pressure). Negative for nausea and vomiting.  Genitourinary: Positive for dysuria, frequency and urgency. Negative for flank pain, hematuria, scrotal swelling and testicular pain.  All other systems reviewed and are negative.    Physical Exam Updated Vital Signs BP (!) 151/64 (BP Location: Right Arm)   Pulse 67   Temp 98.3 F (36.8 C) (Oral)   Resp 12   Ht 6\' 3"  (1.905 m)   Wt 82.6 kg   SpO2 100%   BMI 22.76 kg/m   Physical Exam  Constitutional: He appears well-developed and well-nourished.  Non-toxic appearance. No distress.  HENT:  Head: Normocephalic and atraumatic.  Eyes: Conjunctivae are normal. Right eye exhibits no discharge. Left eye exhibits no discharge.  Neck: Neck supple.  Cardiovascular: Normal rate and regular rhythm.  Pulmonary/Chest: Effort normal and breath sounds normal. No respiratory distress. He has no wheezes. He has no rhonchi. He has no rales.  Respiration even and unlabored.  Patient utilizing his baseline oxygen via nasal cannula.  Abdominal: Soft. He exhibits no distension. There is tenderness (Minimal suprapubic.). There is no rigidity, no rebound, no guarding and no CVA tenderness.  Genitourinary: Right testis shows no tenderness. Left testis shows no tenderness.  Genitourinary Comments: Chaperone present.   Neurological: He is alert.  Clear speech.   Skin: Skin  is warm and dry. No rash noted.  Psychiatric: He has a normal mood and affect. His behavior is normal.  Nursing note and vitals reviewed.   ED Treatments / Results  Labs (all labs ordered are listed, but only abnormal results are displayed) Labs Reviewed  COMPREHENSIVE METABOLIC PANEL - Abnormal; Notable for the following components:      Result Value   Glucose, Bld 104 (*)  BUN 59 (*)    Creatinine, Ser 1.36 (*)    Albumin 3.4 (*)    GFR calc non Af Amer 49 (*)    GFR calc Af Amer 57 (*)    All other components within normal limits  CBC WITH DIFFERENTIAL/PLATELET - Abnormal; Notable for the following components:   WBC 19.1 (*)    RBC 3.71 (*)    Hemoglobin 9.5 (*)    HCT 33.4 (*)    MCH 25.6 (*)    MCHC 28.4 (*)    RDW 18.1 (*)    Neutro Abs 11.4 (*)    Monocytes Absolute 4.0 (*)    Basophils Absolute 1.0 (*)    All other components within normal limits  URINALYSIS, ROUTINE W REFLEX MICROSCOPIC - Abnormal; Notable for the following components:   APPearance CLOUDY (*)    Hgb urine dipstick SMALL (*)    Protein, ur 100 (*)    Nitrite POSITIVE (*)    Leukocytes, UA LARGE (*)    WBC, UA >50 (*)    Bacteria, UA MANY (*)    All other components within normal limits  I-STAT CG4 LACTIC ACID, ED  I-STAT CG4 LACTIC ACID, ED    EKG None  Radiology No results found.  Procedures Procedures (including critical care time)  Medications Ordered in ED Medications  cephALEXin (KEFLEX) capsule 500 mg (has no administration in time range)     Initial Impression / Assessment and Plan / ED Course  I have reviewed the triage vital signs and the nursing notes.  Pertinent labs & imaging results that were available during my care of the patient were reviewed by me and considered in my medical decision making (see chart for details).   Patient presents to the emergency department with complaints of urinary symptoms consistent with prior UTIs.  He is nontoxic-appearing, no  apparent distress, vitals WNL with the exception of elevated blood pressure, doubt HTN emergency.  Physical exam is fairly unremarkable.  Labs per triage reviewed: Urinalysis consistent with UTI.  Patient does have an associated leukocytosis at 19.1 with left shift- similar has been present with previous UTIs which have been treated in the outpatient setting.  His lactic acid is within normal limits.  Creatinine mildly elevated at 1.36 from prior 1.21. Hgb is at baseline.  Patient overall well-appearing, he is afebrile in the ED, no CVA tenderness or N/V to raise concern for pyelonephritis.  No current indications for hospitalization.  Feel the patient is appropriate for outpatient therapy.  Prior urine culture from 12/04/2017 reviewed: Positive for > 100,000 colonies of Pseudomonas aeruginosa, sensitive to several antibiotics with the exception of resistance to ciprofloxacin. Findings and plan of care discussed with supervising physician Dr. Dayna Barker who personally evaluated and examined this patient- recommends 2 week course of keflex with urology follow up given recurrent UTIs, possibly requires prophylaxis. In agreement with plan, carried out as above, first dose in the ED, discount card provided to patient. Urine culture sent.  I discussed results, treatment plan, need for follow-up, and return precautions with the patient. Provided opportunity for questions, patient confirmed understanding and is in agreement with plan.    Final Clinical Impressions(s) / ED Diagnoses   Final diagnoses:  Acute cystitis with hematuria    ED Discharge Orders         Ordered    cephALEXin (KEFLEX) 500 MG capsule  2 times daily     01/06/18 2233  Leafy Kindle 01/07/18 [id number]    Merrily Pew, MD 01/08/18 [phone]

## 2018-01-06 NOTE — Discharge Instructions (Addendum)
He was seen in the emergency department today and found to have a UTI.  We are placing you on Keflex, an antibiotic, to treat the UTI.  Given this appears to be a recurrent problem, we would like you to take this for 2 weeks.  We would like you to follow-up very closely with urology your urologist to discuss this as well.  Follow-up with urology in the next 3 to 5 days.  Please take all of your antibiotics until finished. You may develop abdominal discomfort or diarrhea from the antibiotic.  You may help offset this with probiotics which you can buy at the store (ask your pharmacist if unable to find) or get probiotics in the form of eating yogurt. Do not eat or take the probiotics until 2 hours after your antibiotic. If you are unable to tolerate these side effects follow-up with your primary care provider or return to the emergency department.   If you begin to experience any blistering, rashes, swelling, or difficulty breathing seek medical care for evaluation of potentially more serious side effects.   Please be aware that this medication may interact with other medications you are taking, please be sure to discuss your medication list with your pharmacist.   You may obtain this antibiotic at [facility] with a discount card provided in your discharge instructions for $4.20.   Follow-up as discussed above.  Return to the ER for fevers, vomiting, back pain, or any new or worsening symptoms or any other concerns.

## 2018-01-06 NOTE — ED Provider Notes (Signed)
Medical screening examination/treatment/procedure(s) were conducted as a shared visit with non-physician practitioner(s) and myself.  I personally evaluated the patient during the encounter.  77 year old male here with dysuria and increased frequency again.  Patient had multiple episodes of urinary tract infections in the past.  Be admitted because the burning hurt so bad however I discussed with them that that was not an indication to stay in hospital as his vital signs are within normal limits and he did not have any evidence of pyelonephritis.  Patient is felt to be stable for outpatient therapy will increase the duration of his therapy secondary to multiple failures in the past.  Urine culture sent.  Needs urology follow-up and possible prophylactic antibiotics to prevent this from happening so frequently.   Fin Hupp, [name], MD 01/08/18 [phone]

## 2018-01-07 LAB — PATHOLOGIST SMEAR REVIEW: Path Review: INCREASED

## 2018-01-09 LAB — URINE CULTURE

## 2018-01-10 ENCOUNTER — Telehealth: Payer: Self-pay | Admitting: Emergency Medicine

## 2018-01-10 NOTE — Telephone Encounter (Signed)
Post ED Visit - Positive Culture Follow-up: Successful Patient Follow-Up  Culture assessed and recommendations reviewed by:  []  Elenor Quinones, Pharm.D. []  Heide Guile, Pharm.D., BCPS AQ-ID []  Parks Neptune, Pharm.D., BCPS []  Alycia Rossetti, Pharm.D., BCPS []  [place], [place].D., BCPS, AAHIVP []  Legrand Como, Pharm.D., BCPS, AAHIVP []  Salome Arnt, PharmD, BCPS []  Johnnette Gourd, PharmD, BCPS []  Hughes Better, PharmD, BCPS []  Leeroy Cha, PharmD  Positive urine culture  []  Patient discharged without antimicrobial prescription and treatment is now indicated [x]  Organism is resistant to prescribed ED discharge antimicrobial []  Patient with positive blood cultures  Changes discussed with ED provider: Reinaldo Meeker PA New antibiotic prescription return to ED for IV antibiotics Spoke to patient re need to return to ED, verbalized understanding   Hazle Nordmann 01/10/2018, 3:35 PM

## 2018-01-27 ENCOUNTER — Ambulatory Visit: Payer: Self-pay | Admitting: Hematology & Oncology

## 2018-01-27 ENCOUNTER — Ambulatory Visit: Payer: Medicare Other | Admitting: Internal Medicine

## 2018-01-27 ENCOUNTER — Other Ambulatory Visit: Payer: Self-pay

## 2018-02-01 MED FILL — AMLODIPINE BESYLATE 5 MG TA: 5 | 30 days supply | Qty: 30 | Fill #2

## 2018-02-01 MED FILL — hydrALAZINE HCL 50 MG TABS: 50 | 30 days supply | Qty: 135 | Fill #0

## 2018-02-01 MED FILL — FUROSEMIDE 40 MG TAB: 40 | 30 days supply | Qty: 90 | Fill #3

## 2018-02-02 ENCOUNTER — Other Ambulatory Visit (HOSPITAL_COMMUNITY): Payer: Self-pay

## 2018-02-02 NOTE — Progress Notes (Signed)
Mr Juan [name] was seen briefly today to drop off prescriptions. While I was there was spoke about football, hunting and a few other things but he did not require help with his medications. No future visits are scheduled as he is only seen when he needs medications delivered.

## 2018-02-07 ENCOUNTER — Other Ambulatory Visit (HOSPITAL_COMMUNITY): Payer: Self-pay | Admitting: *Deleted

## 2018-02-07 ENCOUNTER — Other Ambulatory Visit: Payer: Self-pay

## 2018-02-07 ENCOUNTER — Ambulatory Visit (HOSPITAL_BASED_OUTPATIENT_CLINIC_OR_DEPARTMENT_OTHER)
Admission: RE | Admit: 2018-02-07 | Discharge: 2018-02-07 | Disposition: A | Discharge status: Home / Self Care | Payer: Medicare Other | Source: Ambulatory Visit | Attending: Internal Medicine | Admitting: Internal Medicine

## 2018-02-07 ENCOUNTER — Ambulatory Visit (HOSPITAL_COMMUNITY)
Admission: RE | Admit: 2018-02-07 | Discharge: 2018-02-07 | Disposition: A | Discharge status: Home / Self Care | Payer: Medicare Other | Source: Ambulatory Visit | Attending: Internal Medicine | Admitting: Internal Medicine

## 2018-02-07 ENCOUNTER — Encounter (HOSPITAL_COMMUNITY): Payer: Self-pay | Admitting: Internal Medicine

## 2018-02-07 VITALS — BP 152/53 | HR 56 | Wt 188.0 lb

## 2018-02-07 DIAGNOSIS — N183 Chronic kidney disease, stage 3 (moderate): Secondary | ICD-10-CM | POA: Insufficient documentation

## 2018-02-07 DIAGNOSIS — I712 Thoracic aortic aneurysm, without rupture: Secondary | ICD-10-CM | POA: Diagnosis not present

## 2018-02-07 DIAGNOSIS — I5032 Chronic diastolic (congestive) heart failure: Secondary | ICD-10-CM | POA: Diagnosis not present

## 2018-02-07 DIAGNOSIS — D471 Chronic myeloproliferative disease: Secondary | ICD-10-CM | POA: Insufficient documentation

## 2018-02-07 DIAGNOSIS — I10 Essential (primary) hypertension: Secondary | ICD-10-CM | POA: Diagnosis not present

## 2018-02-07 DIAGNOSIS — Z818 Family history of other mental and behavioral disorders: Secondary | ICD-10-CM | POA: Insufficient documentation

## 2018-02-07 DIAGNOSIS — Z7982 Long term (current) use of aspirin: Secondary | ICD-10-CM | POA: Insufficient documentation

## 2018-02-07 DIAGNOSIS — N390 Urinary tract infection, site not specified: Secondary | ICD-10-CM | POA: Insufficient documentation

## 2018-02-07 DIAGNOSIS — Z79899 Other long term (current) drug therapy: Secondary | ICD-10-CM | POA: Insufficient documentation

## 2018-02-07 DIAGNOSIS — E1122 Type 2 diabetes mellitus with diabetic chronic kidney disease: Secondary | ICD-10-CM | POA: Insufficient documentation

## 2018-02-07 DIAGNOSIS — I5042 Chronic combined systolic (congestive) and diastolic (congestive) heart failure: Secondary | ICD-10-CM | POA: Diagnosis not present

## 2018-02-07 DIAGNOSIS — Z9981 Dependence on supplemental oxygen: Secondary | ICD-10-CM | POA: Insufficient documentation

## 2018-02-07 DIAGNOSIS — Z87891 Personal history of nicotine dependence: Secondary | ICD-10-CM | POA: Insufficient documentation

## 2018-02-07 DIAGNOSIS — I130 Hypertensive heart and chronic kidney disease with heart failure and stage 1 through stage 4 chronic kidney disease, or unspecified chronic kidney disease: Secondary | ICD-10-CM | POA: Diagnosis not present

## 2018-02-07 DIAGNOSIS — G4733 Obstructive sleep apnea (adult) (pediatric): Secondary | ICD-10-CM | POA: Diagnosis not present

## 2018-02-07 DIAGNOSIS — I2510 Atherosclerotic heart disease of native coronary artery without angina pectoris: Secondary | ICD-10-CM | POA: Insufficient documentation

## 2018-02-07 DIAGNOSIS — R21 Rash and other nonspecific skin eruption: Secondary | ICD-10-CM | POA: Diagnosis not present

## 2018-02-07 NOTE — Progress Notes (Signed)
Patient ID: Juan [name], male   DOB: Nov 22, 1940, 77 y.o.   MRN: [id number]    Advanced Heart Failure Clinic Note   PCP: Dr Larose Kells  Primary HF Cardiologist: Dr Haroldine Laws  Primary Cardiologist: Dr Johnsie Cancel   HPI: Juan [name] is a 77 y.o. male with history of Diastolic HF, OSA, HTN, DM2, and myeloproliferative disorder followed by Oncology.   He was seen by onc 10/22/14 for his myeloproliferative disorder. 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   Admitted to [facility] 01/2015 with increased dyspnea and volume overload. Diuresed with lasix and transitioned to lasix 40 mg three times a day.   Admitted twice in  December 2017 with volume overload.  Diuresed with IV lasix the transitioned to lasix 40 mg twice a day. Discharge weight 198 pounds. Discharged to SNF.    Admitted 10/08/16-10/17/16 with volume overload. PA pressure on Echo was 85 mmHg. This was a newly high PA pressure for him. VQ scan negative for PE. ANA, RA and HIV negative. He was diuresed with IV lasix about 15 pounds. Discharge weight was 194 pounds. It was recommended that he go to SNF, but he preferred to go home with home health. He also was started on home 02 at 2L.   Admitted earlier 12/2016 with hip and flank pain. Torsemide and metolazone held at discharge. Per note because patient "wasn't in a CHF flare".   Admitted 01/23/17-01/26/17 with hip, back and foot pain. 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 Dr. Marin Olp saw in the hospital and [name] was started. Weight was down to 185 pounds, his lasix was stopped at discharge.   Admitted 05/2017 with lower GI bleed. Had scope with clip placed. Aspirin stopped. Discharged on lasix 40 mg daily. He refused SNF placement.   He has had 6 admission since November 7510  for complicated UTI (1 for GI bleed). He has an indwelling foley and urology follow up. Recently refused PICC line so received one week of daptomycin as inpatient. He was previously in SNF in December, but demanded he be sent home due to PT/OT requirement. He has since refused SNF follow up.   He presents today for HF follow up. Says he is doing well from a heart perspective. Denies CP, SOB, orthopnea or PND. Can walk around hose with walker or walking stick. Weight stable. Continues to struggle with UTIs and groin rash.   Echo today EF 50-55% Normal RV. Grade I DD.  Personally reviewed   Echo 01/21/15 EF 30%, moderate LVH, mild MR, PA peak pressure 59 mm Hg, Normal RV ECHO 12/2015: Ef 55-60%. Grade IDD Echo 09/2016 EF 55-60%, PA pressure 85 mm Hg.   Review of systems complete and found to be negative unless listed in HPI.   SH:  Social History   Socioeconomic History  . Marital status: Widowed    Spouse name: Not on file  . Number of children: 2  . Years of education: Not on file  . Highest education level: Not on file  Occupational History  . Occupation: retired     Fish farm manager: RETIRED  Social Needs  . Financial resource strain: Not on file  . Food insecurity:    Worry: Not on file    Inability: Not on file  . Transportation needs:    Medical:  Not on file    Non-medical: Not on file  Tobacco Use  . Smoking status: Former Smoker    Packs/day: 0.25    Years: 12.00    Pack years: 3.00    Types: Cigarettes    Last attempt to quit: 05/18/1966    Years since quitting: 51.7  . Smokeless tobacco: Never Used  . Tobacco comment: quit smoking 45 years ago  Substance and Sexual Activity  . Alcohol use: No    Alcohol/week: 0.0 standard drinks  . Drug use: No  . Sexual activity: Yes  Lifestyle  . Physical activity:    Days per week: Not on file    Minutes per session: Not on file  . Stress: Not on file  Relationships  . Social connections:    Talks on phone: Not on file    Gets  together: Not on file    Attends religious service: Not on file    Active member of club or organization: Not on file    Attends meetings of clubs or organizations: Not on file    Relationship status: Not on file  . Intimate partner violence:    Fear of current or ex partner: Not on file    Emotionally abused: Not on file    Physically abused: Not on file    Forced sexual activity: Not on file  Other Topics Concern  . Not on file  Social History Narrative   Moved from [place] 2006   Widow 2007   Son lives with him       Admitted to [facility]08[facility]17   Former smoker-stopped 1968   Alcohol none   Full Code   FH: Family History  Problem Relation Age of Onset  . Schizophrenia Son   . Mental illness Daughter   . Colon cancer Neg Hx   . Prostate cancer Neg Hx   . Heart attack Neg Hx   . Diabetes Neg Hx     Past Medical History:  Diagnosis Date  . Allergic rhinitis   . Anemia   . Ascending aortic aneurysm ([place]) 03/2014   4.3cm on CT scan  . CAD (coronary artery disease)    dx elsewheer in past, no documentation. Non-ischemic myoview 2007  . Chronic diastolic CHF (congestive heart failure), NYHA class 2 (HCC)    Normal EF w/ grade 1 dd by echo 12/2015  . Chronic respiratory failure with hypoxia/2L at home    "2L; all the time" (05/03/2017)  . CKD (chronic kidney disease), stage III ([place])   . Edema    R>L leg, u/s 5-12 neg for DVT  . Hemorrhoid   . History of hydronephrosis   . History of Splenic infarct   . History of thrombocytosis   . Hypertension   . Iron deficiency anemia due to chronic blood loss 03/31/2017  . 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 Migraine    "once/wk at least" (07/11/2013)  . Moderate to severe pulmonary hypertension ([place])   . Myeloproliferative disease ([place])   . Pyelonephritis 04/29/2017  . Recurrent Pseudomonas urinary tract infection   . Shortness of breath   . Sinus congestion   . Sleep apnea, obstructive    at some point used  CPAP, was d/c  years ago  . Type II diabetes mellitus ([place])   . UTI (urinary tract infection) 06/2017  . UTI (urinary tract infection) 07/2017    Current Outpatient Medications  Medication Sig Dispense Refill  . acetaminophen (TYLENOL) 325  MG tablet Take 2 tablets (650 mg total) by mouth every 6 (six) hours as needed for mild pain (or Fever >/= 101).    Marland Kitchen amLODipine (NORVASC) 5 MG tablet Take 1 tablet (5 mg total) by mouth daily. For BP 30 tablet 2  . aspirin EC 81 MG tablet Take 81 mg by mouth daily.    Marland Kitchen b complex vitamins tablet Take 1 tablet by mouth daily.    . benzonatate (TESSALON) 100 MG capsule Take by mouth 3 (three) times daily as needed for cough.    . clotrimazole (LOTRIMIN AF) 1 % cream Apply to Lt foot (between) Toes bid for athletes foot 28 g 0  . ferrous gluconate (FERGON) 240 (27 FE) MG tablet Take 240 mg by mouth daily.    . furosemide (LASIX) 40 MG tablet Take '40mg'$  (1 tab) in morning and '40mg'$  (1tab) in evening (Patient taking differently: Take 80 mg (2 tab) in morning and '40mg'$  (1tab) in evening) 1 tablet 0  . hydrALAZINE (APRESOLINE) 50 MG tablet Take 1.5 tablets (75 mg total) by mouth 3 (three) times daily. 135 tablet 2  . miconazole (MICOTIN) 2 % powder Apply topically as needed for itching.    . mouth rinse LIQD solution 15 mLs by Mouth Rinse route 2 (two) times daily. 118 mL 0  . ondansetron (ZOFRAN) 4 MG tablet Take 1 tablet (4 mg total) by mouth every 6 (six) hours as needed for nausea or vomiting. 10 tablet 0  . OXYGEN Inhale 2 L into the lungs continuous.    . tamsulosin (FLOMAX) 0.4 MG CAPS capsule Take 1 capsule (0.4 mg total) by mouth daily after supper. 30 capsule 0   No current facility-administered medications for this encounter.    Vitals:   02/07/18 1107  BP: (!) 152/53  Pulse: (!) 56  SpO2: 100%  Weight: 85.3 kg (188 lb)     Wt Readings from Last 3 Encounters:  02/07/18 85.3 kg (188 lb)  01/06/18 82.6 kg (182 lb 1.6 oz)  12/09/17 82.6 kg (182  lb 3.2 oz)    PHYSICAL EXAM:  General:  Elderly male in [facility]. No resp difficulty HEENT: normal Neck: supple. no JVD. Carotids 2+ bilat; no bruits. No lymphadenopathy or thryomegaly appreciated. Cor: PMI nondisplaced. Regular rate & rhythm. No rubs, gallops or murmurs. Lungs: clear Abdomen: soft, nontender, nondistended. No hepatosplenomegaly. No bruits or masses. Good bowel sounds. Extremities: no cyanosis, clubbing, rash, edema Neuro: alert & orientedx3, cranial nerves grossly intact. moves all 4 extremities w/o difficulty. Affect pleasant    ASSESSMENT & PLAN: 1. Chronic combined systolic and diastolic CHF, EF 43% echo 01/21/15, Normal myoview 2007.  EF 55-60% in 09/2016. Echo today EF 50-55% Personally reviewed - Chronic NYHA III symptoms. Not very active.  - Volume status stable on exam.  - Continue lasix 80 mg am, 40 mg pm. Should take extra tab in evening as needed.  - He continues to refuse any help in the home.  - Reinforced fluid restriction to < 2 L daily, sodium restriction to less than 2000 mg daily, and the importance of daily weights.   - HF paramedicine delivers his medications - Set up repeat echo.   2. CKD stage III - BMET recently stable  3. HTN - Continue amlodipine 5 mg daily.  - Continue hydralazine to 75 mg TID.  - Elevated today but says well controlled at home  4. 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 - Now seeing Dr. Marin Olp. PET scan  with diffuse metastatic disease and metabolic bone disease.  - No change   5. H/O GI Bleed  - Denies bleeding, but requests CBC today. - S/P sigmoidoscopy with clip placed 05/2017   6. Chronic urinary retention - Admitted 08/2017 for UTI.  - Frequent ED visits for urinary symptoms. Currently on Keflex for UTI. Symptoms improved. No longer has a foley.  EF remains normal. Can graduate from HF program and follow with [facility] cardiology.    Glori Bickers, MD  02/07/18

## 2018-02-07 NOTE — Addendum Note (Signed)
Encounter addended by: Darron Doom, RN on: 02/07/2018 11:41 AM  Actions taken: Sign clinical note

## 2018-02-07 NOTE — Patient Instructions (Signed)
Your physician wants you to follow-up in: 6 months with Dr. Johnsie Cancel. You will receive a reminder letter in the mail two months in advance. If you don't receive a letter, please call our office to schedule the follow-up appointment.

## 2018-02-07 NOTE — Progress Notes (Signed)
  Echocardiogram 2D Echocardiogram has been performed.  Juan [name] G Zoie Sarin 02/07/2018, 11:07 AM

## 2018-02-09 ENCOUNTER — Other Ambulatory Visit (HOSPITAL_COMMUNITY): Payer: Self-pay

## 2018-02-09 ENCOUNTER — Telehealth: Payer: Self-pay | Admitting: Nurse Practitioner

## 2018-02-09 MED ORDER — FUROSEMIDE 40 MG PO TABS: ORAL_TABLET | ORAL | 1 refills | Status: DC

## 2018-02-09 MED ORDER — HYDRALAZINE HCL 50 MG PO TABS: 75.0000 mg | ORAL_TABLET | Freq: Three times a day (TID) | ORAL | 2 refills | Status: AC

## 2018-02-09 MED ORDER — AMLODIPINE BESYLATE 5 MG PO TABS: 5.0000 mg | ORAL_TABLET | Freq: Every day | ORAL | 11 refills | Status: DC

## 2018-02-09 MED FILL — hydrALAZINE HCL 50 MG TABS: 50 | 30 days supply | Qty: 135 | Fill #0

## 2018-02-09 MED FILL — FUROSEMIDE 40 MG TAB: 40 | 30 days supply | Qty: 90 | Fill #0

## 2018-02-09 MED FILL — AMLODIPINE BESYLATE 5 MG TA: 5 | 30 days supply | Qty: 30 | Fill #0

## 2018-02-09 NOTE — Telephone Encounter (Signed)
°*  STAT* If patient is at the pharmacy, call can be transferred to refill team.   1. Which medications need to be refilled? (please list name of each medication and dose if known) Furosemide 40 mg Hydralazine 50 gm Amlodipine besylate 5 mg     2. Which pharmacy/location (including street and city if local pharmacy) is medication to be sent to? [facility]   3. Do they need a 30 day or 90 day supply? 30 and 90  Patient has a appt coming up but need refills.

## 2018-02-09 NOTE — Telephone Encounter (Signed)
This is a CHF pt 

## 2018-02-11 ENCOUNTER — Other Ambulatory Visit (HOSPITAL_COMMUNITY): Payer: Self-pay | Admitting: *Deleted

## 2018-02-18 ENCOUNTER — Ambulatory Visit: Payer: Medicare Other | Admitting: Internal Medicine

## 2018-02-23 ENCOUNTER — Ambulatory Visit: Payer: Medicare Other | Admitting: Internal Medicine

## 2018-02-23 ENCOUNTER — Ambulatory Visit: Payer: Self-pay | Admitting: Hematology & Oncology

## 2018-02-23 ENCOUNTER — Other Ambulatory Visit: Payer: Self-pay

## 2018-02-23 DIAGNOSIS — Z0289 Encounter for other administrative examinations: Secondary | ICD-10-CM

## 2018-02-25 ENCOUNTER — Telehealth: Payer: Self-pay | Admitting: *Deleted

## 2018-02-25 NOTE — Telephone Encounter (Signed)
S/w pt and moved upcoming appt to Richardson Dopp, PA schedule due to pt's availability.  Pt could only come in on Monday, Oct 14 and wanted to be seen.

## 2018-02-25 NOTE — Telephone Encounter (Deleted)
S/w pt and moved upcoming appt to Richardson Dopp, PA schedule due to pt's availability. Pt could only come in on Monday and wanted to be seen.

## 2018-02-25 NOTE — Telephone Encounter (Signed)
Tried to call pt to move upcoming appt on Monday, Oct 14 @ 9:30 am. Mobile phone line did not work and home phone disconnected.

## 2018-02-28 ENCOUNTER — Ambulatory Visit: Payer: Self-pay | Admitting: Nurse Practitioner

## 2018-02-28 ENCOUNTER — Encounter: Payer: Self-pay | Admitting: Physician Assistant

## 2018-02-28 ENCOUNTER — Ambulatory Visit (INDEPENDENT_AMBULATORY_CARE_PROVIDER_SITE_OTHER): Payer: Medicare Other | Admitting: Medical

## 2018-02-28 VITALS — BP 140/60 | HR 70 | Ht 75.0 in | Wt 191.8 lb

## 2018-02-28 DIAGNOSIS — N183 Chronic kidney disease, stage 3 unspecified: Secondary | ICD-10-CM

## 2018-02-28 DIAGNOSIS — D471 Chronic myeloproliferative disease: Secondary | ICD-10-CM

## 2018-02-28 DIAGNOSIS — I5042 Chronic combined systolic (congestive) and diastolic (congestive) heart failure: Secondary | ICD-10-CM | POA: Diagnosis not present

## 2018-02-28 DIAGNOSIS — N390 Urinary tract infection, site not specified: Secondary | ICD-10-CM | POA: Diagnosis not present

## 2018-02-28 DIAGNOSIS — I5032 Chronic diastolic (congestive) heart failure: Secondary | ICD-10-CM

## 2018-02-28 DIAGNOSIS — I10 Essential (primary) hypertension: Secondary | ICD-10-CM | POA: Diagnosis not present

## 2018-02-28 NOTE — Progress Notes (Signed)
Cardiology Office Note   Date:  02/28/2018   ID:  Juan [name], DOB [birth year], MRN [id number]  PCP:  Colon Branch, MD  Cardiologist:  Jenkins Rouge, MD   Chief Complaint  Patient presents with  . Follow-up    CHF      History of Present Illness: Juan [name] is a 77 y.o. male with a PMH of chronic combined CHF, HTN, DM type 2, OSA, CKD stage 3, BPH c/b frequent UTIs, Hx of GI bleed, myeloproliferative disorder, recently seen at that advanced heart failure clinic and graduated to general cardiology given improved CHF, who presents for follow-up of his CHF.   He was last seen by Dr. Haroldine Laws 02/07/2018 and was felt to be doing well from a cardiac standpoint. Echo at that visit with EF 50-55%, G1DD, moderate LAE and mild RAE. Given improved/stable echo results he was considered a graduate from the HF program and recommended for f/u with general cardiology going forward.   He has no new complaints today since his last visit with Dr. Haroldine Laws. He continues to do well with his lasix and reports stable weights and LE edema. He is limiting his salt and fluid intake. He has ben on home O2 2L for the past year and reports that his breathing is stable. He does note some intermittent chest pain which occurs after eating spicy foods which is resolved with tylenol. He denies orthopnea, PND, dizziness, lightheadedness, syncope, problems with bleeding or urination.    Past Medical History:  Diagnosis Date  . Allergic rhinitis   . Anemia   . Ascending aortic aneurysm ([place]) 03/2014   4.3cm on CT scan  . CAD (coronary artery disease)    dx elsewheer in past, no documentation. Non-ischemic myoview 2007  . Chronic diastolic CHF (congestive heart failure), NYHA class 2 (HCC)    Normal EF w/ grade 1 dd by echo 12/2015  . Chronic respiratory failure with hypoxia/2L at home    "2L; all the time" (05/03/2017)  . CKD (chronic kidney disease), stage III ([place])   . Edema    R>L leg, u/s 5-12 neg for DVT  .  Hemorrhoid   . History of hydronephrosis   . History of Splenic infarct   . History of thrombocytosis   . Hypertension   . Iron deficiency anemia due to chronic blood loss 03/31/2017  . 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 Migraine    "once/wk at least" (07/11/2013)  . Moderate to severe pulmonary hypertension ([place])   . Myeloproliferative disease ([place])   . Pyelonephritis 04/29/2017  . Recurrent Pseudomonas urinary tract infection   . Shortness of breath   . Sinus congestion   . Sleep apnea, obstructive    at some point used CPAP, was d/c  years ago  . Type II diabetes mellitus ([place])   . UTI (urinary tract infection) 06/2017  . UTI (urinary tract infection) 07/2017    Past Surgical History:  Procedure Laterality Date  . FLEXIBLE SIGMOIDOSCOPY N/A 06/08/2017   Procedure: FLEXIBLE SIGMOIDOSCOPY;  Surgeon: Carol Ada, MD;  Location: [facility];  Service: Endoscopy;  Laterality: N/A;  . IR FLUORO GUIDE CV LINE RIGHT  05/07/2017  . IR FLUORO RM 30-60 MIN  05/07/2017  . IR US GUIDE VASC ACCESS RIGHT  05/07/2017  . TOE SURGERY Right    "tried to straighten out big toe" (07/11/2013)     Current Outpatient Medications  Medication Sig Dispense Refill  . acetaminophen (TYLENOL) 325 MG tablet Take 2  tablets (650 mg total) by mouth every 6 (six) hours as needed for mild pain (or Fever >/= 101).    Marland Kitchen amLODipine (NORVASC) 5 MG tablet Take 1 tablet (5 mg total) by mouth daily. For BP 30 tablet 11  . aspirin EC 81 MG tablet Take 81 mg by mouth daily.    Marland Kitchen b complex vitamins tablet Take 1 tablet by mouth daily.    . benzonatate (TESSALON) 100 MG capsule Take by mouth 3 (three) times daily as needed for cough.    . clotrimazole (LOTRIMIN AF) 1 % cream Apply to Lt foot (between) Toes bid for athletes foot 28 g 0  . ferrous gluconate (FERGON) 240 (27 FE) MG tablet Take 240 mg by mouth daily.    . furosemide (LASIX) 40 MG tablet Take 80 mg (2 tab) in morning and 40mg  (1tab) in evening 90 tablet 1    . hydrALAZINE (APRESOLINE) 50 MG tablet Take 1.5 tablets (75 mg total) by mouth 3 (three) times daily. 135 tablet 2  . miconazole (MICOTIN) 2 % powder Apply topically as needed for itching.    . mouth rinse LIQD solution 15 mLs by Mouth Rinse route 2 (two) times daily. 118 mL 0  . ondansetron (ZOFRAN) 4 MG tablet Take 1 tablet (4 mg total) by mouth every 6 (six) hours as needed for nausea or vomiting. 10 tablet 0  . OXYGEN Inhale 2 L into the lungs continuous.    . tamsulosin (FLOMAX) 0.4 MG CAPS capsule Take 1 capsule (0.4 mg total) by mouth daily after supper. 30 capsule 0   No current facility-administered medications for this visit.     Allergies:   Patient has no known allergies.    Social History:  The patient  reports that he quit smoking about 51 years ago. His smoking use included cigarettes. He has a 3.00 pack-year smoking history. He has never used smokeless tobacco. He reports that he does not drink alcohol or use drugs.   Family History:  The patient's family history includes Mental illness in his daughter; Schizophrenia in his son.    ROS:  Please see the history of present illness.   Otherwise, review of systems are positive for none.   All other systems are reviewed and negative.    PHYSICAL EXAM: VS:  BP 140/60   Pulse 70   Ht 6\' 3"  (1.905 m)   Wt 191 lb 12.8 oz (87 kg)   SpO2 96%   BMI 23.97 kg/m  , BMI Body mass index is 23.97 kg/m. GEN: Well nourished, well developed, in no acute distress HEENT: sclera anicteric  Neck: no JVD, carotid bruits, or masses Cardiac: RRR; no murmurs, rubs, or gallops,no edema  Respiratory:  clear to auscultation bilaterally, normal work of breathing GI: soft, nontender, nondistended, + BS MS: no deformity or atrophy Skin: warm and dry, no rash Neuro:  Strength and sensation are intact Psych: euthymic mood, full affect   EKG:  EKG is ordered today. The ekg ordered today demonstrates sinus rhythm with 1st degree AV block,  incomplete RBBB, LVH pattern, no STE/D   Recent Labs: 07/16/2017: B Natriuretic Peptide 499.9 08/05/2017: Magnesium 2.1 01/06/2018: ALT 26; BUN 59; Creatinine, Ser 1.36; Hemoglobin 9.5; Platelets 198; Potassium 4.7; Sodium 139    Lipid Panel    Component Value Date/Time   CHOL 176 11/14/2016 0504   TRIG 56 11/14/2016 0504   TRIG 79 04/05/2006 1115   HDL 59 11/14/2016 0504   CHOLHDL 3.0 11/14/2016  0504   VLDL 11 11/14/2016 0504   LDLCALC 106 (H) 11/14/2016 0504      Wt Readings from Last 3 Encounters:  02/28/18 191 lb 12.8 oz (87 kg)  02/07/18 188 lb (85.3 kg)  01/06/18 182 lb 1.6 oz (82.6 kg)      Other studies Reviewed: Additional studies/ records that were reviewed today include:   Echocardiogram 02/07/18: ------------------------------------------------------------------- Study Conclusions  - Left ventricle: The cavity size was at the upper limits of   normal. Wall thickness was increased in a pattern of mild LVH.   Systolic function was normal. The estimated ejection fraction was   in the range of 50% to 55%. Wall motion was normal; there were no   regional wall motion abnormalities. Doppler parameters are   consistent with abnormal left ventricular relaxation (grade 1   diastolic dysfunction). - Mitral valve: Calcified annulus. - Left atrium: The atrium was moderately dilated. - Right atrium: The atrium was mildly dilated.   ASSESSMENT AND PLAN:  1. Chronic combined CHF: Last echo 02/07/18 with EF 50-55% with G1DD (previously EF 30% in 2016). Volume status stable on lasix 80mg  qam and 40mg  qpm with prn extra pm dosing.  - Will check a BMET today to monitor kidney function with ongoing diuresis - Continue current lasix dosing - Encouraged to continue limiting salt and fluid intake  2. HTN: BP 140/60 today; on amlodipine, hydralazine, and lasix.  - Instructed patient to monitor his BP a couple times per week to get a sense of what his BP runs outside of the  office - If BP remains elevated, could consider increasing amlodipine   3. CKD stage 3: Cr 1.36 12/2017 - Will check BMET today to monitor kidney function with diuresis  4. DM type 2: Last HgbA1C 5.4 08/2017; at goal of <70 - Continue management per PCP  5. Myeloproliferative disorder: reportedly has refused treatment in the past. Dr. Marin Olp states that it is amazing his disease has not transformed to acute leukemia.  - Continue routine follow-up with Dr. Marin Olp   6. BPH c/b urinary retention and frequent UTIs: frequent admissions for UTI. Has required a foley in the past. No currently on antibiotics - Continue tamsulosin and management per PCP.    Current medicines are reviewed at length with the patient today.  The patient does not have concerns regarding medicines.  The following changes have been made:  no change  Labs/ tests ordered today include: BMET and Mg  Orders Placed This Encounter  Procedures  . Basic Metabolic Panel (BMET)  . Magnesium  . EKG 12-Lead     Disposition:   FU with Dr. Johnsie Cancel in 4 months  Signed, Abigail Butts, PA-C  02/28/2018 11:57 AM

## 2018-02-28 NOTE — Patient Instructions (Signed)
Medication Instructions:  1. Your physician recommends that you continue on your current medications as directed. Please refer to the Current Medication list given to you today.  If you need a refill on your cardiac medications before your next appointment, please call your pharmacy.   Lab work: TODAY BMET, MAGNESIUM LEVEL If you have labs (blood work) drawn today and your tests are completely normal, you will receive your results only by: Marland Kitchen MyChart Message (if you have MyChart) OR . A paper copy in the mail If you have any lab test that is abnormal or we need to change your treatment, we will call you to review the results.  Testing/Procedures: NONE ORDERED TODAY  Follow-Up: At [facility], you and your health needs are our priority.  As part of our continuing mission to provide you with exceptional heart care, we have created designated Provider Care Teams.  These Care Teams include your primary Cardiologist (physician) and Advanced Practice Providers (APPs -  Physician Assistants and Nurse Practitioners) who all work together to provide you with the care you need, when you need it. You will need a follow up appointment in 4 months.  Please call our office 2 months in advance to schedule this appointment.  You may see Jenkins Rouge, MD or one of the following Advanced Practice Providers on your designated Care Team:   Truitt Merle, NP Cecilie Kicks, NP . Kathyrn Drown, NP  Any Other Special Instructions Will Be Listed Below (If Applicable).

## 2018-03-01 ENCOUNTER — Telehealth: Payer: Self-pay

## 2018-03-01 ENCOUNTER — Encounter: Payer: Self-pay | Admitting: Internal Medicine

## 2018-03-01 LAB — BASIC METABOLIC PANEL
BUN/Creatinine Ratio: 58 — ABNORMAL HIGH (ref 10–24)
BUN: 68 mg/dL — ABNORMAL HIGH (ref 8–27)
CO2: 24 mmol/L (ref 20–29)
Calcium: 10 mg/dL (ref 8.6–10.2)
Chloride: 101 mmol/L (ref 96–106)
Creatinine, Ser: 1.18 mg/dL (ref 0.76–1.27)
GFR calc Af Amer: 69 mL/min/{1.73_m2} (ref >59)
GFR calc non Af Amer: 60 mL/min/{1.73_m2} (ref >59)
Glucose: 105 mg/dL — ABNORMAL HIGH (ref 65–99)
Potassium: 4.1 mmol/L (ref 3.5–5.2)
Sodium: 142 mmol/L (ref 134–144)

## 2018-03-01 LAB — MAGNESIUM: Magnesium: 2.6 mg/dL — ABNORMAL HIGH (ref 1.6–2.3)

## 2018-03-01 NOTE — Telephone Encounter (Signed)
Notes recorded by Frederik Schmidt, RN on 03/01/2018 at 4:03 PM EDT Unable to reach, no VM set up. ------

## 2018-03-01 NOTE — Telephone Encounter (Signed)
-----   Message from Abigail Butts, PA-C sent at 03/01/2018  2:30 PM EDT ----- Please notify the patient that his kidney function and potassium level looks stable. He should continue his current medications as discussed at yesterdays visit. Thank you!

## 2018-03-02 ENCOUNTER — Other Ambulatory Visit: Payer: Self-pay

## 2018-03-02 ENCOUNTER — Telehealth: Payer: Self-pay | Admitting: *Deleted

## 2018-03-02 ENCOUNTER — Inpatient Hospital Stay (HOSPITAL_BASED_OUTPATIENT_CLINIC_OR_DEPARTMENT_OTHER): Payer: Medicare Other | Admitting: Family

## 2018-03-02 ENCOUNTER — Encounter: Payer: Self-pay | Admitting: Internal Medicine

## 2018-03-02 ENCOUNTER — Inpatient Hospital Stay: Payer: Medicare Other | Attending: Hematology & Oncology

## 2018-03-02 ENCOUNTER — Ambulatory Visit (INDEPENDENT_AMBULATORY_CARE_PROVIDER_SITE_OTHER): Payer: Medicare Other | Admitting: Internal Medicine

## 2018-03-02 VITALS — BP 142/60 | HR 78 | Temp 97.8°F | Resp 16 | Ht 75.0 in | Wt 191.0 lb

## 2018-03-02 VITALS — BP 146/53 | HR 66 | Temp 97.8°F | Resp 17 | Wt 191.0 lb

## 2018-03-02 DIAGNOSIS — D471 Chronic myeloproliferative disease: Secondary | ICD-10-CM

## 2018-03-02 DIAGNOSIS — R5383 Other fatigue: Secondary | ICD-10-CM | POA: Diagnosis not present

## 2018-03-02 DIAGNOSIS — D469 Myelodysplastic syndrome, unspecified: Secondary | ICD-10-CM

## 2018-03-02 DIAGNOSIS — I5032 Chronic diastolic (congestive) heart failure: Secondary | ICD-10-CM | POA: Diagnosis not present

## 2018-03-02 DIAGNOSIS — R339 Retention of urine, unspecified: Secondary | ICD-10-CM | POA: Insufficient documentation

## 2018-03-02 DIAGNOSIS — Z23 Encounter for immunization: Secondary | ICD-10-CM | POA: Diagnosis not present

## 2018-03-02 DIAGNOSIS — E790 Hyperuricemia without signs of inflammatory arthritis and tophaceous disease: Secondary | ICD-10-CM

## 2018-03-02 LAB — CMP (CANCER CENTER ONLY)
ALBUMIN: 3.3 g/dL — AB (ref 3.5–5.0)
ALT: 27 U/L (ref 10–47)
ANION GAP: 0 — AB (ref 5–15)
AST: 33 U/L (ref 11–38)
Alkaline Phosphatase: 123 U/L — ABNORMAL HIGH (ref 26–84)
BUN: 62 mg/dL — AB (ref 7–22)
CALCIUM: 10.2 mg/dL (ref 8.0–10.3)
CO2: 28 mmol/L (ref 18–33)
Chloride: 109 mmol/L — ABNORMAL HIGH (ref 98–108)
Creatinine: 1.5 mg/dL — ABNORMAL HIGH (ref 0.60–1.20)
Glucose, Bld: 124 mg/dL — ABNORMAL HIGH (ref 73–118)
Potassium: 4.1 mmol/L (ref 3.3–4.7)
SODIUM: 137 mmol/L (ref 128–145)
TOTAL PROTEIN: 7.1 g/dL (ref 6.4–8.1)
Total Bilirubin: 0.4 mg/dL (ref 0.2–1.6)

## 2018-03-02 LAB — CBC WITH DIFFERENTIAL (CANCER CENTER ONLY)
BASOS PCT: 2 %
Band Neutrophils: 12 %
Basophils Absolute: 0.4 10*3/uL — ABNORMAL HIGH (ref 0.0–0.1)
Eosinophils Absolute: 0.2 10*3/uL (ref 0.0–0.5)
Eosinophils Relative: 1 %
HCT: 29.7 % — ABNORMAL LOW (ref 39.0–52.0)
HEMOGLOBIN: 8.8 g/dL — AB (ref 13.0–17.0)
LYMPHS PCT: 16 %
Lymphs Abs: 3.4 10*3/uL (ref 0.7–4.0)
MCH: 24.9 pg — AB (ref 26.0–34.0)
MCHC: 29.6 g/dL — ABNORMAL LOW (ref 30.0–36.0)
MCV: 84.1 fL (ref 80.0–100.0)
MONO ABS: 3.4 10*3/uL — AB (ref 0.1–1.0)
Metamyelocytes Relative: 12 %
Monocytes Relative: 16 %
Myelocytes: 7 %
NRBC: 1.3 % — AB (ref 0.0–0.2)
Neutro Abs: 13.7 10*3/uL — ABNORMAL HIGH (ref 1.7–7.7)
Neutrophils Relative %: 31 %
PROMYELOCYTES RELATIVE: 3 %
Platelet Count: 128 10*3/uL — ABNORMAL LOW (ref 150–400)
RBC: 3.53 MIL/uL — ABNORMAL LOW (ref 4.22–5.81)
RDW: 17.2 % — ABNORMAL HIGH (ref 11.5–15.5)
WBC Count: 21.1 10*3/uL — ABNORMAL HIGH (ref 4.0–10.5)

## 2018-03-02 LAB — URIC ACID: Uric Acid, Serum: 11.4 mg/dL — ABNORMAL HIGH (ref 3.7–8.6)

## 2018-03-02 LAB — LACTATE DEHYDROGENASE: LDH: 696 U/L — AB (ref 98–192)

## 2018-03-02 LAB — SAVE SMEAR (SSMR)

## 2018-03-02 NOTE — Progress Notes (Signed)
Subjective:    Patient ID: Juan [name], male    DOB: 1940-11-01, 77 y.o.   MRN: [id number]  DOS:  03/02/2018 Type of visit - description : f/u Interval history: Last visit was May 2019 Since then, he had multiple ER visits and has seen cardiology multiple times. Last cardiology visit was 02/28/2018.  Note reviewed.   Review of Systems Reports no chest pain, shortness of breath at baseline. I asked about fever and chills and he said "sometimes".  He is afebrile today. Urinating okay, does not have a Foley presently.   Past Medical History:  Diagnosis Date  . Allergic rhinitis   . Anemia   . Ascending aortic aneurysm ([place]) 03/2014   4.3cm on CT scan  . CAD (coronary artery disease)    dx elsewheer in past, no documentation. Non-ischemic myoview 2007  . Chronic diastolic CHF (congestive heart failure), NYHA class 2 (HCC)    Normal EF w/ grade 1 dd by echo 12/2015  . Chronic respiratory failure with hypoxia/2L at home    "2L; all the time" (05/03/2017)  . CKD (chronic kidney disease), stage III ([place])   . Edema    R>L leg, u/s 5-12 neg for DVT  . Hemorrhoid   . History of hydronephrosis   . History of Splenic infarct   . History of thrombocytosis   . Hypertension   . Iron deficiency anemia due to chronic blood loss 03/31/2017  . 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 Migraine    "once/wk at least" (07/11/2013)  . Moderate to severe pulmonary hypertension ([place])   . Myeloproliferative disease ([place])   . Pyelonephritis 04/29/2017  . Recurrent Pseudomonas urinary tract infection   . Shortness of breath   . Sinus congestion   . Sleep apnea, obstructive    at some point used CPAP, was d/c  years ago  . Type II diabetes mellitus ([place])   . UTI (urinary tract infection) 06/2017  . UTI (urinary tract infection) 07/2017    Past Surgical History:  Procedure Laterality Date  . FLEXIBLE SIGMOIDOSCOPY N/A 06/08/2017   Procedure: FLEXIBLE SIGMOIDOSCOPY;  Surgeon: Carol Ada, MD;   Location: [facility];  Service: Endoscopy;  Laterality: N/A;  . IR FLUORO GUIDE CV LINE RIGHT  05/07/2017  . IR FLUORO RM 30-60 MIN  05/07/2017  . IR US GUIDE VASC ACCESS RIGHT  05/07/2017  . TOE SURGERY Right    "tried to straighten out big toe" (07/11/2013)    Social History   Socioeconomic History  . Marital status: Widowed    Spouse name: Not on file  . Number of children: 2  . Years of education: Not on file  . Highest education level: Not on file  Occupational History  . Occupation: retired     Fish farm manager: RETIRED  Social Needs  . Financial resource strain: Not on file  . Food insecurity:    Worry: Not on file    Inability: Not on file  . Transportation needs:    Medical: Not on file    Non-medical: Not on file  Tobacco Use  . Smoking status: Former Smoker    Packs/day: 0.25    Years: 12.00    Pack years: 3.00    Types: Cigarettes    Last attempt to quit: 05/18/1966    Years since quitting: 51.8  . Smokeless tobacco: Never Used  . Tobacco comment: quit smoking 45 years ago  Substance and Sexual Activity  . Alcohol use: No    Alcohol/week: 0.0  standard drinks  . Drug use: No  . Sexual activity: Not Currently  Lifestyle  . Physical activity:    Days per week: Not on file    Minutes per session: Not on file  . Stress: Not on file  Relationships  . Social connections:    Talks on phone: Not on file    Gets together: Not on file    Attends religious service: Not on file    Active member of club or organization: Not on file    Attends meetings of clubs or organizations: Not on file    Relationship status: Not on file  . Intimate partner violence:    Fear of current or ex partner: Not on file    Emotionally abused: Not on file    Physically abused: Not on file    Forced sexual activity: Not on file  Other Topics Concern  . Not on file  Social History Narrative   Moved from [place] 2006   Widow 2007   Son lives with him       Ad[facility]   Former smoker-stopped 1968   Alcohol none   Full Code      Allergies as of 03/02/2018   No Known Allergies     Medication List        Accurate as of 03/02/18 11:59 PM. Always use your most recent med list.          acetaminophen 325 MG tablet Commonly known as:  TYLENOL Take 2 tablets (650 mg total) by mouth every 6 (six) hours as needed for mild pain (or Fever >/= 101).   amLODipine 5 MG tablet Commonly known as:  NORVASC Take 1 tablet (5 mg total) by mouth daily. For BP   aspirin EC 81 MG tablet Take 81 mg by mouth daily.   b complex vitamins tablet Take 1 tablet by mouth daily.   benzonatate 100 MG capsule Commonly known as:  TESSALON Take by mouth 3 (three) times daily as needed for cough.   clotrimazole 1 % cream Commonly known as:  LOTRIMIN Apply to Lt foot (between) Toes bid for athletes foot   ferrous gluconate 240 (27 FE) MG tablet Commonly known as:  FERGON Take 240 mg by mouth daily.   furosemide 40 MG tablet Commonly known as:  LASIX Take 80 mg (2 tab) in morning and 40mg  (1tab) in evening   hydrALAZINE 50 MG tablet Commonly known as:  APRESOLINE Take 1.5 tablets (75 mg total) by mouth 3 (three) times daily.   miconazole 2 % powder Commonly known as:  MICOTIN Apply topically as needed for itching.   mouth rinse Liqd solution 15 mLs by Mouth Rinse route 2 (two) times daily.   ondansetron 4 MG tablet Commonly known as:  ZOFRAN Take 1 tablet (4 mg total) by mouth every 6 (six) hours as needed for nausea or vomiting.   OXYGEN Inhale 2 L into the lungs continuous.   tamsulosin 0.4 MG Caps capsule Commonly known as:  FLOMAX Take 1 capsule (0.4 mg total) by mouth daily after supper.          Objective:   Physical Exam BP (!) 142/60 (BP Location: Right Arm, Patient Position: Sitting, Cuff Size: Small)   Pulse 78   Temp 97.8 F (36.6 C) (Oral)   Resp 16   Ht 6\' 3"  (1.905 m)   Wt 191 lb (86.6 kg)   SpO2 94%   BMI  23.87 kg/m  General:   Well developed, NAD, see BMI.  HEENT:  Normocephalic . Face symmetric, atraumatic Lungs:  CTA B Normal respiratory effort, no intercostal retractions, no accessory muscle use. Heart: RRR,  Soft syst  murmur.  No pretibial edema bilaterally  DIABETIC FEET EXAM: No lower extremity edema Skin: Poor hygiene, dry.  Nails are extremely long.  No calluses.    Pinprick examination of the feet normal. Skin: Not pale. Not jaundice Neurologic:  alert & oriented X3.  Speech normal, gait not tested, sitting in a wheelchair. Psych--  Behavior appropriate. No anxious or depressed appearing.      Assessment & Plan:   Assessment Prediabetes  Neuropathy, on Neurontin prn and hydrocodone prn HTN CKD, creatinine ~ 1.7-1.9 Chronic edema and stasis dermatitis Morbid obesity CV: --CHF, EF 30% 01-2015, normal Myoview 2007, refuse it cardiac catheterization --Ascending aortic aneurysm per CT angio 03-2014, 4.3 cm --CT chest without done 06-2014 --  Refused MRA of the Ao 2016  --pulmonary hypertension: 10-2016: V/Q can neg, ANA-RF-ANCa (-) Hypoxia: On home oxygen since admission 10/2016 Hem-onc: ---Myeloproliferative disease , refuses treatment --- anemia --- thrombocytosiscytosis  GU: BPH- bladder outlet obstruction-chronic Foley Per urology 2017: "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 They recommend continuing Foley catheter, change every 4 weeks " OSA, used a CPAP at some point Poor compliance with advice:  Saw psychiatry 04/07/2014 at the hospital and deemed to be competent to make his own decisions. Deemed completed again March 2019  PLAN: Prediabetes: A1c was 5.1 in April.  Recheck on RTC. Neuropathy:  reports numbness, pinprick examination is normal.  Feet care discussed. CHF: Last visit a couple of days ago, felt to be stable.  Will check a FLP  (addendum, pt declined). Anemia: Hemoglobin stable, iron was normal 09/2017, probably multifactorial including chronic disease. Social: Here with his son [name], the patient asked him to go outside the room while I did the office visit.  Unfortunately the patient is having a difficult time financially. Flu shot today RTC 4 to 6 months

## 2018-03-02 NOTE — Progress Notes (Signed)
Hematology and Oncology Follow Up Visit  Juan [name] [id number] [birth year] 77 y.o. 03/02/2018   Principle Diagnosis:  Chronic myeloproliferative disorder Urinary retention  Current Therapy:   Observation   Interim History:  Juan [name] is here today with his son for follow-up. Juan [name] is feeling fatigued but is able to get up and ambulate with his walker at home.  Juan [name] has maintained a good appetite and states that Juan [name] is hungry and that Juan [name] ad his son do not always have money for food. Juan [name] states that Juan [name] is hydrating well. His weight is stable.  We have contacted both social workers Verdell Carmine and [employer] to help. Juan [name] has been contacted in the past and refused help and home visits. Juan [name] seems to like his privacy but still asks staff for financial help while Juan [name] is here.  Juan [name] met with our financial councilor today and does not want to give [place] his financial information to see what assistance Juan [name] is eligible for. Juan [name] states that Juan [name] does not want to do that because "it is too much red tape and Juan [name] doesn't want folks prying around." Juan [name] states that it would stress him out too much and Juan [name] does not want feel stressed when Juan [name] is sick.  We were able to get him a cab ride home. Juan [name] has issues with frequent UTI's and is on and off of antibiotics. His catheter is out and Juan [name] is not having any issues urinating at this time.  No fever, n/v, cough, rash, dizziness, blurred vision, headaches, palpitations, abdominal pain or changes in bowel or bladder habits.  Juan [name] has occasional episodes of SOB and angina with over exertion.  His skin is dry and Juan [name] itches off and on.  The chronic swelling in his lower extremities appears to be stable. Juan [name] states that Juan [name] takes his lasix BID as prescribed.  Juan [name] has intermittent numbness and tingling in his feet.  No falls or syncopal episodes to report.  No lymphadenopathy noted on exam.   ECOG Performance Status: 1 - Symptomatic but completely ambulatory  Medications:  Allergies as  of 03/02/2018   No Known Allergies     Medication List        Accurate as of 03/02/18 12:35 PM. Always use your most recent med list.          acetaminophen 325 MG tablet Commonly known as:  TYLENOL Take 2 tablets (650 mg total) by mouth every 6 (six) hours as needed for mild pain (or Fever >/= 101).   amLODipine 5 MG tablet Commonly known as:  NORVASC Take 1 tablet (5 mg total) by mouth daily. For BP   aspirin EC 81 MG tablet Take 81 mg by mouth daily.   b complex vitamins tablet Take 1 tablet by mouth daily.   benzonatate 100 MG capsule Commonly known as:  TESSALON Take by mouth 3 (three) times daily as needed for cough.   clotrimazole 1 % cream Commonly known as:  LOTRIMIN Apply to Lt foot (between) Toes bid for athletes foot   ferrous gluconate 240 (27 FE) MG tablet Commonly known as:  FERGON Take 240 mg by mouth daily.   furosemide 40 MG tablet Commonly known as:  LASIX Take 80 mg (2 tab) in morning and '40mg'$  (1tab) in evening   hydrALAZINE 50 MG tablet Commonly known as:  APRESOLINE Take 1.5 tablets (75 mg total) by mouth 3 (three) times daily.   miconazole 2 % powder Commonly known as:  MICOTIN Apply topically  as needed for itching.   mouth rinse Liqd solution 15 mLs by Mouth Rinse route 2 (two) times daily.   ondansetron 4 MG tablet Commonly known as:  ZOFRAN Take 1 tablet (4 mg total) by mouth every 6 (six) hours as needed for nausea or vomiting.   OXYGEN Inhale 2 L into the lungs continuous.   tamsulosin 0.4 MG Caps capsule Commonly known as:  FLOMAX Take 1 capsule (0.4 mg total) by mouth daily after supper.       Allergies: No Known Allergies  Past Medical History, Surgical history, Social history, and Family History were reviewed and updated.  Review of Systems: All other 10 point review of systems is negative.   Physical Exam:  weight is 191 lb (86.6 kg). His oral temperature is 97.8 F (36.6 C). His blood pressure is 146/53  (abnormal) and his pulse is 66. His respiration is 17 and oxygen saturation is 100%.   Wt Readings from Last 3 Encounters:  03/02/18 191 lb (86.6 kg)  03/02/18 191 lb (86.6 kg)  02/28/18 191 lb 12.8 oz (87 kg)    Ocular: Sclerae unicteric, pupils equal, round and reactive to light Ear-nose-throat: Oropharynx clear, dentition fair Lymphatic: No cervical, supraclavicular or axillary adenopathy Lungs no rales or rhonchi, good excursion bilaterally Heart regular rate and rhythm, no murmur appreciated Abd soft, nontender, positive bowel sounds, no liver or spleen tip palpated on exam, no fluid wave  MSK no focal spinal tenderness, no joint edema Neuro: non-focal, well-oriented, appropriate affect Breasts: Deferred   Lab Results  Component Value Date   WBC 19.1 (H) 01/06/2018   HGB 9.5 (L) 01/06/2018   HCT 33.4 (L) 01/06/2018   MCV 90.0 01/06/2018   PLT 198 01/06/2018   Lab Results  Component Value Date   FERRITIN 306 09/22/2017   IRON 47 09/22/2017   TIBC 245 09/22/2017   UIBC 198 09/22/2017   IRONPCTSAT 19 (L) 09/22/2017   Lab Results  Component Value Date   RETICCTPCT 2.7 06/08/2017   RBC 3.71 (L) 01/06/2018   RETICCTABS 110.40 (H) 11/16/2013   No results found for: KPAFRELGTCHN, LAMBDASER, KAPLAMBRATIO No results found for: IGGSERUM, IGA, IGMSERUM No results found for: Kathrynn Ducking, MSPIKE, SPEI   Chemistry      Component Value Date/Time   NA 142 02/28/2018 1202   NA 143 03/30/2017 1420   NA 138 11/16/2013 0847   K 4.1 02/28/2018 1202   K 4.3 03/30/2017 1420   K 4.5 11/16/2013 0847   CL 101 02/28/2018 1202   CL 110 (H) 03/30/2017 1420   CO2 24 02/28/2018 1202   CO2 27 03/30/2017 1420   CO2 22 11/16/2013 0847   BUN 68 (H) 02/28/2018 1202   BUN 38 (H) 03/30/2017 1420   BUN 20.2 11/16/2013 0847   CREATININE 1.18 02/28/2018 1202   CREATININE 1.21 09/22/2017 0818   CREATININE 1.3 (H) 03/30/2017 1420   CREATININE 1.2  11/16/2013 0847   GLU 93 05/24/2017      Component Value Date/Time   CALCIUM 10.0 02/28/2018 1202   CALCIUM 9.8 03/30/2017 1420   CALCIUM 9.8 11/16/2013 0847   ALKPHOS 100 01/06/2018 1856   ALKPHOS 116 (H) 03/30/2017 1420   ALKPHOS 125 11/16/2013 0847   AST 28 01/06/2018 1856   AST 26 09/22/2017 0818   AST 25 11/16/2013 0847   ALT 26 01/06/2018 1856   ALT 23 09/22/2017 0818   ALT 22 03/30/2017 1420   ALT 11  11/16/2013 0847   BILITOT 0.6 01/06/2018 1856   BILITOT <0.2 (L) 09/22/2017 0818   BILITOT 0.35 11/16/2013 0847       Impression and Plan: Juan [name] is a 77 yo African American gentleman with a chronic myeloproliferative disorder and on today's smear immature cells were noted. Dr. Maylon Peppers was able to view and we have added a flow cytometry.  Lab tried but were not able to obtain enough blood to add a BCR/ABL so we will get at his next visit.  We will hold off on [name] at this time.  We will try to get him back in 2 weeks but Juan [name] states Juan [name] may not be able to due to his trouble with transportation.  We are really trying to help him but Juan [name] continues to refuse. CSW will follow-up with him later today once Juan [name] is home.  They will contact our office with any questions or concerns. We can certainly see Juan [name] sooner if need be.   Laverna Peace, NP 10/16/201912:35 PM

## 2018-03-02 NOTE — Progress Notes (Signed)
Pre visit review using our clinic review tool, if applicable. No additional management support is needed unless otherwise documented below in the visit note. 

## 2018-03-02 NOTE — Patient Instructions (Signed)
GO TO THE LAB : Get the blood work     GO TO THE FRONT DESK Schedule your next appointment for a  Check up in 4- 6 months     Diabetes and Foot Care Diabetes may cause you to have problems because of poor blood supply (circulation) to your feet and legs. This may cause the skin on your feet to become thinner, break easier, and heal more slowly. Your skin may become dry, and the skin may peel and crack. You may also have nerve damage in your legs and feet causing decreased feeling in them. You may not notice minor injuries to your feet that could lead to infections or more serious problems. Taking care of your feet is one of the most important things you can do for yourself. Follow these instructions at home:  Wear shoes at all times, even in the house. Do not go barefoot. Bare feet are easily injured.  Check your feet daily for blisters, cuts, and redness. If you cannot see the bottom of your feet, use a mirror or ask someone for help.  Wash your feet with warm water (do not use hot water) and mild soap. Then pat your feet and the areas between your toes until they are completely dry. Do not soak your feet as this can dry your skin.  Apply a moisturizing lotion or petroleum jelly (that does not contain alcohol and is unscented) to the skin on your feet and to dry, brittle toenails. Do not apply lotion between your toes.  Trim your toenails straight across. Do not dig under them or around the cuticle. File the edges of your nails with an emery board or nail file.  Do not cut corns or calluses or try to remove them with medicine.  Wear clean socks or stockings every day. Make sure they are not too tight. Do not wear knee-high stockings since they may decrease blood flow to your legs.  Wear shoes that fit properly and have enough cushioning. To break in new shoes, wear them for just a few hours a day. This prevents you from injuring your feet. Always look in your shoes before you put them on to  be sure there are no objects inside.  Do not cross your legs. This may decrease the blood flow to your feet.  If you find a minor scrape, cut, or break in the skin on your feet, keep it and the skin around it clean and dry. These areas may be cleansed with mild soap and water. Do not cleanse the area with peroxide, alcohol, or iodine.  When you remove an adhesive bandage, be sure not to damage the skin around it.  If you have a wound, look at it several times a day to make sure it is healing.  Do not use heating pads or hot water bottles. They may burn your skin. If you have lost feeling in your feet or legs, you may not know it is happening until it is too late.  Make sure your health care provider performs a complete foot exam at least annually or more often if you have foot problems. Report any cuts, sores, or bruises to your health care provider immediately. Contact a health care provider if:  You have an injury that is not healing.  You have cuts or breaks in the skin.  You have an ingrown nail.  You notice redness on your legs or feet.  You feel burning or tingling in your legs  or feet.  You have pain or cramps in your legs and feet.  Your legs or feet are numb.  Your feet always feel cold. Get help right away if:  There is increasing redness, swelling, or pain in or around a wound.  There is a red line that goes up your leg.  Pus is coming from a wound.  You develop a fever or as directed by your health care provider.  You notice a bad smell coming from an ulcer or wound. This information is not intended to replace advice given to you by your health care provider. Make sure you discuss any questions you have with your health care provider. Document Released: 05/01/2000 Document Revised: 10/10/2015 Document Reviewed: 10/11/2012 Elsevier Interactive Patient Education  2017 [employer].

## 2018-03-02 NOTE — Telephone Encounter (Signed)
Pt here for f/u visit with provider. Pt asked staff RN for money for food. Reached out to [employer], lmovm to return call to office and reach out to pt as he is needing assistance. Pt given LCSW information and contact #.

## 2018-03-03 LAB — IRON AND TIBC
Iron: 37 ug/dL — ABNORMAL LOW (ref 42–163)
Saturation Ratios: 16 % — ABNORMAL LOW (ref 42–163)
TIBC: 225 ug/dL (ref 202–409)
UIBC: 188 ug/dL

## 2018-03-03 LAB — FERRITIN: Ferritin: 441 ng/mL — ABNORMAL HIGH (ref 24–336)

## 2018-03-03 NOTE — Assessment & Plan Note (Signed)
Prediabetes: A1c was 5.1 in April.  Recheck on RTC. Neuropathy:  reports numbness, pinprick examination is normal.  Feet care discussed. CHF: Last visit a couple of days ago, felt to be stable.  Will check a FLP (addendum, pt declined). Anemia: Hemoglobin stable, iron was normal 09/2017, probably multifactorial including chronic disease. Social: Here with his son [name], the patient asked him to go outside the room while I did the office visit.  Unfortunately the patient is having a difficult time financially. Flu shot today RTC 4 to 6 months

## 2018-03-04 ENCOUNTER — Other Ambulatory Visit: Payer: Self-pay

## 2018-03-04 ENCOUNTER — Encounter: Payer: Self-pay | Admitting: *Deleted

## 2018-03-04 ENCOUNTER — Other Ambulatory Visit (HOSPITAL_COMMUNITY): Payer: Self-pay

## 2018-03-04 ENCOUNTER — Telehealth: Payer: Self-pay

## 2018-03-04 ENCOUNTER — Telehealth (HOSPITAL_COMMUNITY): Payer: Self-pay

## 2018-03-04 MED ORDER — LEVOFLOXACIN 500 MG PO TABS: 500.0000 mg | ORAL_TABLET | Freq: Every day | ORAL | 0 refills | Status: DC

## 2018-03-04 MED FILL — levoFLOXacin 500 MG TABS: 500 | 5 days supply | Qty: 5 | Fill #0

## 2018-03-04 NOTE — Progress Notes (Signed)
I went to Mr Dibella house to deliver his medication to him. He did not come to the door so I gave them to his grandson. The medication was $9 since it was not a HF related medication. I will speak to him next week to see if he can pay for it.

## 2018-03-04 NOTE — Telephone Encounter (Addendum)
Attached message given to pt via phone. D/t transportation issues, pt requests iron infusion be added to appts on 10/31. Message to scheduler. Pt verbalizes understanding re: antibiotics for UTI. dph  ----- Message from Volanda Napoleon, MD sent at 03/04/2018 12:31 PM EDT ----- Call - urine has Psuedomonas!!  Please give him Levaquin 500mg  po q day x 5 days.  He also needs iron infusion next week!!!  Juan [name]

## 2018-03-04 NOTE — Progress Notes (Signed)
[place] [facility]  Clinical Social Work was referred by [employer]	[place] [facility] for assessment of psychosocial needs.  Clinical Social Worker contacted patient by phone  to offer support and assess for needs.  Mr. Kilgore stated he was thankful for the phone call, but has no needs at this time.  CSW explored concerns for food access, patient stated he has no issues at this time.  CSW discussed possible resources if he changed his mind, patient stated he's not interested.  Patient did not wish to receive CSW contact information, this CSW encouraged him to follow up with Raquel Sarna, LCSW at [facility] as well.      Gwinda Maine, LCSW  Clinical Social Worker [facility]

## 2018-03-04 NOTE — Telephone Encounter (Signed)
Juan [name] called me to let me know he has medications at the pharmacy that needed to be picked up and he needed to get them today. I advised that I would be able to pick them up later in the day and deliver them. He was agreeable.

## 2018-03-05 LAB — URINE CULTURE

## 2018-03-08 ENCOUNTER — Telehealth (HOSPITAL_COMMUNITY): Payer: Self-pay

## 2018-03-08 NOTE — Telephone Encounter (Signed)
I called Juan [name] to advise him that the last medication I delivered to him was not covered by the HF fund and he would have to pay for the medication. He did not answer and no voicemail was available to leave a message. I will try again later.

## 2018-03-09 ENCOUNTER — Telehealth: Payer: Self-pay | Admitting: *Deleted

## 2018-03-09 ENCOUNTER — Inpatient Hospital Stay (HOSPITAL_COMMUNITY)
Admission: EM | Admit: 2018-03-09 | Discharge: 2018-03-15 | DRG: 690 | Disposition: A | Discharge status: Home / Self Care | Payer: Medicare Other | Attending: Internal Medicine | Admitting: Internal Medicine

## 2018-03-09 ENCOUNTER — Emergency Department (HOSPITAL_COMMUNITY): Payer: Medicare Other

## 2018-03-09 ENCOUNTER — Encounter (HOSPITAL_COMMUNITY): Payer: Self-pay | Admitting: *Deleted

## 2018-03-09 DIAGNOSIS — R300 Dysuria: Secondary | ICD-10-CM | POA: Diagnosis not present

## 2018-03-09 DIAGNOSIS — D471 Chronic myeloproliferative disease: Secondary | ICD-10-CM | POA: Diagnosis present

## 2018-03-09 DIAGNOSIS — I130 Hypertensive heart and chronic kidney disease with heart failure and stage 1 through stage 4 chronic kidney disease, or unspecified chronic kidney disease: Secondary | ICD-10-CM | POA: Diagnosis present

## 2018-03-09 DIAGNOSIS — D500 Iron deficiency anemia secondary to blood loss (chronic): Secondary | ICD-10-CM | POA: Diagnosis present

## 2018-03-09 DIAGNOSIS — R079 Chest pain, unspecified: Secondary | ICD-10-CM | POA: Diagnosis not present

## 2018-03-09 DIAGNOSIS — J9611 Chronic respiratory failure with hypoxia: Secondary | ICD-10-CM | POA: Diagnosis present

## 2018-03-09 DIAGNOSIS — N179 Acute kidney failure, unspecified: Secondary | ICD-10-CM | POA: Diagnosis present

## 2018-03-09 DIAGNOSIS — N400 Enlarged prostate without lower urinary tract symptoms: Secondary | ICD-10-CM | POA: Diagnosis present

## 2018-03-09 DIAGNOSIS — R1084 Generalized abdominal pain: Secondary | ICD-10-CM | POA: Diagnosis not present

## 2018-03-09 DIAGNOSIS — N390 Urinary tract infection, site not specified: Principal | ICD-10-CM | POA: Diagnosis present

## 2018-03-09 DIAGNOSIS — N183 Chronic kidney disease, stage 3 unspecified: Secondary | ICD-10-CM | POA: Diagnosis present

## 2018-03-09 DIAGNOSIS — R52 Pain, unspecified: Secondary | ICD-10-CM | POA: Diagnosis not present

## 2018-03-09 DIAGNOSIS — G4733 Obstructive sleep apnea (adult) (pediatric): Secondary | ICD-10-CM | POA: Diagnosis present

## 2018-03-09 DIAGNOSIS — R809 Proteinuria, unspecified: Secondary | ICD-10-CM | POA: Diagnosis present

## 2018-03-09 DIAGNOSIS — I272 Pulmonary hypertension, unspecified: Secondary | ICD-10-CM | POA: Diagnosis present

## 2018-03-09 DIAGNOSIS — I2510 Atherosclerotic heart disease of native coronary artery without angina pectoris: Secondary | ICD-10-CM | POA: Diagnosis present

## 2018-03-09 DIAGNOSIS — I5032 Chronic diastolic (congestive) heart failure: Secondary | ICD-10-CM | POA: Diagnosis present

## 2018-03-09 DIAGNOSIS — Z9981 Dependence on supplemental oxygen: Secondary | ICD-10-CM

## 2018-03-09 DIAGNOSIS — B965 Pseudomonas (aeruginosa) (mallei) (pseudomallei) as the cause of diseases classified elsewhere: Secondary | ICD-10-CM | POA: Diagnosis present

## 2018-03-09 DIAGNOSIS — Z87891 Personal history of nicotine dependence: Secondary | ICD-10-CM

## 2018-03-09 DIAGNOSIS — M2550 Pain in unspecified joint: Secondary | ICD-10-CM | POA: Diagnosis not present

## 2018-03-09 DIAGNOSIS — Z818 Family history of other mental and behavioral disorders: Secondary | ICD-10-CM | POA: Diagnosis not present

## 2018-03-09 DIAGNOSIS — I25118 Atherosclerotic heart disease of native coronary artery with other forms of angina pectoris: Secondary | ICD-10-CM | POA: Diagnosis not present

## 2018-03-09 DIAGNOSIS — R823 Hemoglobinuria: Secondary | ICD-10-CM | POA: Diagnosis present

## 2018-03-09 DIAGNOSIS — E1122 Type 2 diabetes mellitus with diabetic chronic kidney disease: Secondary | ICD-10-CM | POA: Diagnosis present

## 2018-03-09 DIAGNOSIS — Z87440 Personal history of urinary (tract) infections: Secondary | ICD-10-CM | POA: Diagnosis not present

## 2018-03-09 DIAGNOSIS — Z7401 Bed confinement status: Secondary | ICD-10-CM | POA: Diagnosis not present

## 2018-03-09 DIAGNOSIS — R9431 Abnormal electrocardiogram [ECG] [EKG]: Secondary | ICD-10-CM | POA: Diagnosis not present

## 2018-03-09 DIAGNOSIS — E875 Hyperkalemia: Secondary | ICD-10-CM | POA: Diagnosis present

## 2018-03-09 DIAGNOSIS — R0602 Shortness of breath: Secondary | ICD-10-CM | POA: Diagnosis not present

## 2018-03-09 LAB — COMPREHENSIVE METABOLIC PANEL
ALT: 24 U/L (ref 0–44)
AST: 31 U/L (ref 15–41)
Albumin: 3.3 g/dL — ABNORMAL LOW (ref 3.5–5.0)
Alkaline Phosphatase: 104 U/L (ref 38–126)
Anion gap: 7 (ref 5–15)
BILIRUBIN TOTAL: 0.3 mg/dL (ref 0.3–1.2)
BUN: 51 mg/dL — AB (ref 8–23)
CO2: 23 mmol/L (ref 22–32)
Calcium: 9.7 mg/dL (ref 8.9–10.3)
Chloride: 109 mmol/L (ref 98–111)
Creatinine, Ser: 1.39 mg/dL — ABNORMAL HIGH (ref 0.61–1.24)
GFR calc Af Amer: 55 mL/min — ABNORMAL LOW (ref >60)
GFR calc non Af Amer: 48 mL/min — ABNORMAL LOW (ref >60)
GLUCOSE: 111 mg/dL — AB (ref 70–99)
POTASSIUM: 3.9 mmol/L (ref 3.5–5.1)
Sodium: 139 mmol/L (ref 135–145)
TOTAL PROTEIN: 6.5 g/dL (ref 6.5–8.1)

## 2018-03-09 LAB — URINALYSIS, ROUTINE W REFLEX MICROSCOPIC
Bilirubin Urine: NEGATIVE
Glucose, UA: NEGATIVE mg/dL
KETONES UR: NEGATIVE mg/dL
NITRITE: NEGATIVE
Protein, ur: 100 mg/dL — AB
Specific Gravity, Urine: 1.009 (ref 1.005–1.030)
pH: 5 (ref 5.0–8.0)

## 2018-03-09 LAB — CBC WITH DIFFERENTIAL/PLATELET
BAND NEUTROPHILS: 2 %
BASOS ABS: 1.5 10*3/uL — AB (ref 0.0–0.1)
BASOS PCT: 8 %
EOS ABS: 0.2 10*3/uL (ref 0.0–0.5)
Eosinophils Relative: 1 %
HEMATOCRIT: 32 % — AB (ref 39.0–52.0)
Hemoglobin: 9.3 g/dL — ABNORMAL LOW (ref 13.0–17.0)
Lymphocytes Relative: 17 %
Lymphs Abs: 3.2 10*3/uL (ref 0.7–4.0)
MCH: 25.3 pg — AB (ref 26.0–34.0)
MCHC: 29.1 g/dL — ABNORMAL LOW (ref 30.0–36.0)
MCV: 87.2 fL (ref 80.0–100.0)
MONOS PCT: 10 %
Metamyelocytes Relative: 4 %
Monocytes Absolute: 1.9 10*3/uL — ABNORMAL HIGH (ref 0.1–1.0)
Myelocytes: 10 %
NEUTROS PCT: 48 %
NRBC: 3 /100{WBCs} — AB
NRBC: 3.1 % — AB (ref 0.0–0.2)
Neutro Abs: 12.2 10*3/uL — ABNORMAL HIGH (ref 1.7–7.7)
Platelets: 148 10*3/uL — ABNORMAL LOW (ref 150–400)
RBC: 3.67 MIL/uL — AB (ref 4.22–5.81)
RDW: 17.6 % — AB (ref 11.5–15.5)
WBC: 19.1 10*3/uL — AB (ref 4.0–10.5)

## 2018-03-09 LAB — I-STAT CG4 LACTIC ACID, ED
Lactic Acid, Venous: 0.88 mmol/L (ref 0.5–1.9)
Lactic Acid, Venous: 1.39 mmol/L (ref 0.5–1.9)

## 2018-03-09 LAB — I-STAT TROPONIN, ED: Troponin i, poc: 0.01 ng/mL (ref 0.00–0.08)

## 2018-03-09 LAB — LIPASE, BLOOD: Lipase: 30 U/L (ref 11–51)

## 2018-03-09 LAB — BRAIN NATRIURETIC PEPTIDE: B NATRIURETIC PEPTIDE 5: 193.7 pg/mL — AB (ref 0.0–100.0)

## 2018-03-09 MED ORDER — ONDANSETRON HCL 4 MG PO TABS: 4.0000 mg | ORAL_TABLET | Freq: Four times a day (QID) | ORAL | Status: DC | PRN

## 2018-03-09 MED ORDER — ACETAMINOPHEN 650 MG RE SUPP: 650.0000 mg | Freq: Four times a day (QID) | RECTAL | Status: DC | PRN

## 2018-03-09 MED ORDER — AMLODIPINE BESYLATE 5 MG PO TABS
5.0000 mg | ORAL_TABLET | Freq: Every day | ORAL | Status: DC
  Administered 2018-03-10 – 2018-03-15 (×6): 5 mg via ORAL
  Filled 2018-03-09 (×7): qty 1

## 2018-03-09 MED ORDER — HYDRALAZINE HCL 50 MG PO TABS
75.0000 mg | ORAL_TABLET | Freq: Three times a day (TID) | ORAL | Status: DC
  Administered 2018-03-09 – 2018-03-15 (×17): 75 mg via ORAL
  Filled 2018-03-09 (×17): qty 1

## 2018-03-09 MED ORDER — SODIUM CHLORIDE 0.9 % IV SOLN
1.0000 g | Freq: Two times a day (BID) | INTRAVENOUS | Status: DC
  Administered 2018-03-09 – 2018-03-12 (×6): 1 g via INTRAVENOUS
  Filled 2018-03-09 (×7): qty 1

## 2018-03-09 MED ORDER — ONDANSETRON HCL 4 MG/2ML IJ SOLN: 4.0000 mg | Freq: Four times a day (QID) | INTRAMUSCULAR | Status: DC | PRN

## 2018-03-09 MED ORDER — FERROUS GLUCONATE 324 (38 FE) MG PO TABS
324.0000 mg | ORAL_TABLET | Freq: Every day | ORAL | Status: DC
  Administered 2018-03-10 – 2018-03-15 (×6): 324 mg via ORAL
  Filled 2018-03-09 (×6): qty 1

## 2018-03-09 MED ORDER — LEVOFLOXACIN 500 MG PO TABS: 500.0000 mg | ORAL_TABLET | Freq: Every day | ORAL | Status: DC

## 2018-03-09 MED ORDER — ASPIRIN EC 325 MG PO TBEC
325.0000 mg | DELAYED_RELEASE_TABLET | Freq: Every day | ORAL | Status: DC
  Administered 2018-03-10 – 2018-03-15 (×6): 325 mg via ORAL
  Filled 2018-03-09 (×7): qty 1

## 2018-03-09 MED ORDER — ACETAMINOPHEN 325 MG PO TABS
650.0000 mg | ORAL_TABLET | Freq: Four times a day (QID) | ORAL | Status: DC | PRN
  Administered 2018-03-11 – 2018-03-15 (×2): 650 mg via ORAL
  Filled 2018-03-09 (×3): qty 2

## 2018-03-09 MED ORDER — FUROSEMIDE 40 MG PO TABS
40.0000 mg | ORAL_TABLET | Freq: Every day | ORAL | Status: DC
  Administered 2018-03-10 – 2018-03-12 (×3): 40 mg via ORAL
  Filled 2018-03-09 (×3): qty 1

## 2018-03-09 MED ORDER — FUROSEMIDE 20 MG PO TABS: 40.0000 mg | ORAL_TABLET | ORAL | Status: DC

## 2018-03-09 MED ORDER — BENZONATATE 100 MG PO CAPS
100.0000 mg | ORAL_CAPSULE | Freq: Three times a day (TID) | ORAL | Status: DC | PRN
  Filled 2018-03-09: qty 1

## 2018-03-09 MED ORDER — CLOTRIMAZOLE 1 % EX CREA
1.0000 "application " | TOPICAL_CREAM | Freq: Two times a day (BID) | CUTANEOUS | Status: DC
  Administered 2018-03-09 – 2018-03-15 (×11): 1 via TOPICAL
  Filled 2018-03-09: qty 15

## 2018-03-09 MED ORDER — B COMPLEX-C PO TABS
1.0000 | ORAL_TABLET | Freq: Every day | ORAL | Status: DC
  Administered 2018-03-10 – 2018-03-15 (×6): 1 via ORAL
  Filled 2018-03-09 (×6): qty 1

## 2018-03-09 MED ORDER — ENOXAPARIN SODIUM 40 MG/0.4ML ~~LOC~~ SOLN
40.0000 mg | SUBCUTANEOUS | Status: DC
  Administered 2018-03-09 – 2018-03-14 (×6): 40 mg via SUBCUTANEOUS
  Filled 2018-03-09 (×6): qty 0.4

## 2018-03-09 MED ORDER — FUROSEMIDE 80 MG PO TABS
80.0000 mg | ORAL_TABLET | Freq: Every day | ORAL | Status: DC
  Administered 2018-03-10 – 2018-03-11 (×2): 80 mg via ORAL
  Filled 2018-03-09 (×2): qty 1

## 2018-03-09 MED ORDER — FLUTICASONE PROPIONATE 50 MCG/ACT NA SUSP
1.0000 | Freq: Every day | NASAL | Status: DC
  Administered 2018-03-09 – 2018-03-15 (×7): 1 via NASAL
  Filled 2018-03-09: qty 16

## 2018-03-09 NOTE — ED Triage Notes (Addendum)
Pt in via EMS to triage stating he was called by his MD and told to come up the hospital for treatment for a bladder infection, he was seen there a few days ago, no distress noted- per physician note pt will require IV antibiotics to treat psuedomonas that is resistant to levaquin.

## 2018-03-09 NOTE — ED Provider Notes (Signed)
[facility] EMERGENCY DEPARTMENT Provider Note   CSN: [id number] Arrival date & time: 03/09/18  1212     History   Chief Complaint Chief Complaint  Patient presents with  . Dysuria    HPI Juan [name] is a 77 y.o. male.  The history is provided by the patient and medical records. No language interpreter was used.  Dysuria   This is a recurrent problem. The current episode started more than 2 days ago. The problem occurs every urination. The problem has not changed since onset.The quality of the pain is described as burning. The pain is mild. There has been no fever. Associated symptoms include chills and nausea. Pertinent negatives include no vomiting, no frequency and no flank pain. He has tried nothing for the symptoms. His past medical history is significant for recurrent UTIs. His past medical history does not include kidney stones.    Past Medical History:  Diagnosis Date  . Allergic rhinitis   . Anemia   . Ascending aortic aneurysm ([place]) 03/2014   4.3cm on CT scan  . CAD (coronary artery disease)    dx elsewheer in past, no documentation. Non-ischemic myoview 2007  . Chronic diastolic CHF (congestive heart failure), NYHA class 2 (HCC)    Normal EF w/ grade 1 dd by echo 12/2015  . Chronic respiratory failure with hypoxia/2L at home    "2L; all the time" (05/03/2017)  . CKD (chronic kidney disease), stage III ([place])   . Edema    R>L leg, u/s 5-12 neg for DVT  . Hemorrhoid   . History of hydronephrosis   . History of Splenic infarct   . History of thrombocytosis   . Hypertension   . Iron deficiency anemia due to chronic blood loss 03/31/2017  . 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 Migraine    "once/wk at least" (07/11/2013)  . Moderate to severe pulmonary hypertension ([place])   . Myeloproliferative disease ([place])   . Pyelonephritis 04/29/2017  . Recurrent Pseudomonas urinary tract infection   . Shortness of breath   . Sinus congestion   . Sleep apnea,  obstructive    at some point used CPAP, was d/c  years ago  . Type II diabetes mellitus ([place])   . UTI (urinary tract infection) 06/2017  . UTI (urinary tract infection) 07/2017    Patient Active Problem List   Diagnosis Date Noted  . ARF (acute renal failure) ([place]) 09/24/2017  . VRE (vancomycin resistant enterococcus) culture positive   . Staphylococcus epidermidis infection   . Acute encephalopathy 08/18/2017  . Sleep apnea, obstructive 08/18/2017  . 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 History of recurrent Pseudomonas urinary tract infection 08/18/2017  . Moderate to severe pulmonary hypertension ([place]) 08/18/2017  . Chronic respiratory failure with hypoxia ([place]) 08/18/2017  . Chronic diastolic CHF (congestive heart failure), NYHA class 2 ([place]) 08/18/2017  . 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 HTN (hypertension) 08/18/2017  . Pyelonephritis 07/17/2017  . Acute GI bleeding 06/07/2017  . Hypokalemia   . Iron deficiency anemia due to chronic blood loss 03/31/2017  . 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 Urinary retention 01/24/2017  . 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 Elevated troponin 11/14/2016  . Recurrent pneumonia 10/31/2016  . Pulmonary hypertension severe   . Sprain of left rotator cuff capsule   . Hyperkalemia   . Paroxysmal SVT (supraventricular tachycardia) ([place]) 04/20/2016  . Type II diabetes mellitus ([place]) 04/20/2016  . Gout 04/20/2016  . B12  deficiency 04/20/2016  . 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 Hydronephrosis   . Thrombocytosis ([place]) 04/07/2016  . T wave inversion in EKG 12/29/2015  . PCP NOTES >>>>>>>>>>>>>>>>> 05/02/2015  . Peripheral edema   . Thoracic aortic aneurysm ([place]) 04/26/2014  . CKD (chronic kidney disease) stage 3, GFR 30-59 ml/min (HCC) 04/26/2014  . Generalized weakness 04/06/2014  . Venous stasis dermatitis of both lower extremities   . Morbid obesity due to excess  calories ([place]) 03/29/2014  . 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 Leukocytosis 10/17/2013  . Poor compliance with advise  05/05/2012  . Annual physical exam 01/14/2012  . Weight loss 01/14/2012  . BPH (benign prostatic hyperplasia) 01/14/2012  . Myeloproliferative disease ([place]) 09/07/2006  . Obstructive sleep apnea 09/07/2006  . 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 Coronary atherosclerosis 09/07/2006  . Allergic rhinitis 09/07/2006    Past Surgical History:  Procedure Laterality Date  . FLEXIBLE SIGMOIDOSCOPY N/A 06/08/2017   Procedure: FLEXIBLE SIGMOIDOSCOPY;  Surgeon: Carol Ada, MD;  Location: [facility];  Service: Endoscopy;  Laterality: N/A;  . IR FLUORO GUIDE CV LINE RIGHT  05/07/2017  . IR FLUORO RM 30-60 MIN  05/07/2017  . IR US GUIDE VASC ACCESS RIGHT  05/07/2017  . TOE SURGERY Right    "tried to straighten out big toe" (07/11/2013)        Home Medications    Prior to Admission medications   Medication Sig Start Date End Date Taking? Authorizing Provider  acetaminophen (TYLENOL) 325 MG tablet Take 2 tablets (650 mg total) by mouth every 6 (six) hours as needed for mild pain (or Fever >/= 101). 05/08/17   Rai, Ripudeep K, MD  amLODipine (NORVASC) 5 MG tablet Take 1 tablet (5 mg total) by mouth daily. For BP 02/09/18   Bensimhon, Shaune Pascal, MD  aspirin EC 81 MG tablet Take 81 mg by mouth daily.    [provider]  b complex vitamins tablet Take 1 tablet by mouth daily.    [provider]  benzonatate (TESSALON) 100 MG capsule Take by mouth 3 (three) times daily as needed for cough.    [provider]  clotrimazole (LOTRIMIN AF) 1 % cream Apply to Lt foot (between) Toes bid for athletes foot 09/12/17   Roxan Hockey, MD  ferrous gluconate (FERGON) 240 (27 FE) MG tablet Take 240 mg by mouth daily.    [provider]  furosemide (LASIX) 40 MG tablet Take 80 mg (2  tab) in morning and 40mg  (1tab) in evening 02/09/18   Bensimhon, Shaune Pascal, MD  hydrALAZINE (APRESOLINE) 50 MG tablet Take 1.5 tablets (75 mg total) by mouth 3 (three) times daily. 02/09/18   Bensimhon, Shaune Pascal, MD  levofloxacin (LEVAQUIN) 500 MG tablet Take 1 tablet (500 mg total) by mouth daily. 03/04/18   Volanda Napoleon, MD  miconazole (MICOTIN) 2 % powder Apply topically as needed for itching.    [provider]  mouth rinse LIQD solution 15 mLs by Mouth Rinse route 2 (two) times daily. 09/26/17   Shelly Coss, MD  ondansetron (ZOFRAN) 4 MG tablet Take 1 tablet (4 mg total) by mouth every 6 (six) hours as needed for nausea or vomiting. 09/12/17   Roxan Hockey, MD  OXYGEN Inhale 2 L into the lungs continuous.    [provider]  tamsulosin (FLOMAX) 0.4 MG CAPS capsule Take 1 capsule (0.4 mg total) by mouth daily after supper. 06/13/17  Bonnielee Haff, MD    Family History Family History  Problem Relation Age of Onset  . Schizophrenia Son   . Mental illness Daughter   . Colon cancer Neg Hx   . Prostate cancer Neg Hx   . Heart attack Neg Hx   . Diabetes Neg Hx     Social History Social History   Tobacco Use  . Smoking status: Former Smoker    Packs/day: 0.25    Years: 12.00    Pack years: 3.00    Types: Cigarettes    Last attempt to quit: 05/18/1966    Years since quitting: 51.8  . Smokeless tobacco: Never Used  . Tobacco comment: quit smoking 45 years ago  Substance Use Topics  . Alcohol use: No    Alcohol/week: 0.0 standard drinks  . Drug use: No     Allergies   Patient has no known allergies.   Review of Systems Review of Systems  Constitutional: Positive for chills. Negative for diaphoresis, fatigue and fever.  HENT: Negative for congestion and rhinorrhea.   Eyes: Negative for visual disturbance.  Respiratory: Positive for cough. Negative for chest tightness, shortness of breath and wheezing.   Cardiovascular: Positive for leg swelling  (chronic). Negative for chest pain and palpitations.  Gastrointestinal: Positive for abdominal pain and nausea. Negative for constipation, diarrhea and vomiting.  Genitourinary: Positive for dysuria. Negative for decreased urine volume, flank pain and frequency.  Musculoskeletal: Negative for back pain, neck pain and neck stiffness.  Skin: Negative for rash and wound.  Neurological: Negative for light-headedness, numbness and headaches.  Psychiatric/Behavioral: Negative for agitation and confusion.  All other systems reviewed and are negative.    Physical Exam Updated Vital Signs BP (!) 162/70 (BP Location: Right Arm)   Pulse 66   Temp 98.2 F (36.8 C) (Oral)   Resp 18   SpO2 100%   Physical Exam  Constitutional: He appears well-developed and well-nourished. No distress.  HENT:  Head: Normocephalic and atraumatic.  Mouth/Throat: Oropharynx is clear and moist. No oropharyngeal exudate.  Eyes: Pupils are equal, round, and reactive to light. Conjunctivae and EOM are normal.  Neck: Neck supple.  Cardiovascular: Normal rate, regular rhythm and intact distal pulses.  No murmur heard. Pulmonary/Chest: Effort normal. No stridor. No tachypnea. No respiratory distress. He has no wheezes. He has rhonchi (mild diffuse). He has no rales. He exhibits no tenderness.  Abdominal: Soft. There is no tenderness.  Musculoskeletal: He exhibits edema. He exhibits no tenderness.       Right lower leg: He exhibits edema.       Left lower leg: He exhibits edema.  Neurological: He is alert. No sensory deficit. He exhibits normal muscle tone.  Skin: Skin is warm and dry. He is not diaphoretic. No erythema. No pallor.  Psychiatric: He has a normal mood and affect.  Nursing note and vitals reviewed.    ED Treatments / Results  Labs (all labs ordered are listed, but only abnormal results are displayed) Labs Reviewed  CBC WITH DIFFERENTIAL/PLATELET - Abnormal; Notable for the following components:       Result Value   WBC 19.1 (*)    RBC 3.67 (*)    Hemoglobin 9.3 (*)    HCT 32.0 (*)    MCH 25.3 (*)    MCHC 29.1 (*)    RDW 17.6 (*)    Platelets 148 (*)    nRBC 3.1 (*)    Neutro Abs 12.2 (*)    Monocytes Absolute  1.9 (*)    Basophils Absolute 1.5 (*)    nRBC 3 (*)    All other components within normal limits  COMPREHENSIVE METABOLIC PANEL - Abnormal; Notable for the following components:   Glucose, Bld 111 (*)    BUN 51 (*)    Creatinine, Ser 1.39 (*)    Albumin 3.3 (*)    GFR calc non Af Amer 48 (*)    GFR calc Af Amer 55 (*)    All other components within normal limits  URINALYSIS, ROUTINE W REFLEX MICROSCOPIC - Abnormal; Notable for the following components:   APPearance HAZY (*)    Hgb urine dipstick SMALL (*)    Protein, ur 100 (*)    Leukocytes, UA LARGE (*)    WBC, UA >50 (*)    Bacteria, UA RARE (*)    All other components within normal limits  BRAIN NATRIURETIC PEPTIDE - Abnormal; Notable for the following components:   B Natriuretic Peptide 193.7 (*)    All other components within normal limits  CULTURE, BLOOD (ROUTINE X 2)  CULTURE, BLOOD (ROUTINE X 2)  URINE CULTURE  LIPASE, BLOOD  CBC WITH DIFFERENTIAL/PLATELET  COMPREHENSIVE METABOLIC PANEL  I-STAT CG4 LACTIC ACID, ED  I-STAT TROPONIN, ED  I-STAT CG4 LACTIC ACID, ED    EKG EKG Interpretation  Date/Time:  Wednesday March 09 2018 14:31:28 EDT Ventricular Rate:  66 PR Interval:    QRS Duration: 103 QT Interval:  432 QTC Calculation: 453 R Axis:   1 Text Interpretation:  Sinus rhythm Probable left ventricular hypertrophy When compared to prior, t wave inversionin lead V1.  No STEMI Confirmed by Antony Blackbird [phone]) on 03/09/2018 2:35:03 PM   Radiology Dg Chest 2 View  Result Date: 03/09/2018 CLINICAL DATA:  Shortness of breath, chest pain, hypertension, diabetes EXAM: CHEST - 2 VIEW COMPARISON:  09/05/2017 FINDINGS: Stable cardiomegaly with mild perihilar and basilar interstitial prominence  suspicious for chronic edema versus interstitial lung disease. Chronic elevation of the right hemidiaphragm. Improvement in the left basilar atelectasis. No large effusion or pneumothorax. Trachea is midline. Aorta atherosclerotic. Diffuse sclerosis of the visualized chest and upper extremities compatible with history of myelofibrosis. IMPRESSION: Stable cardiomegaly with mild chronic interstitial edema versus interstitial lung disease. Chronic right hemidiaphragm elevation Improving left basilar atelectasis Chronic osseous sclerosis compatible with myelofibrosis Atherosclerosis Electronically Signed   By: Jerilynn Mages.  Shick M.D.   On: 03/09/2018 15:29    Procedures Procedures (including critical care time)  Medications Ordered in ED Medications  ceFEPIme (MAXIPIME) 1 g in sodium chloride 0.9 % 100 mL IVPB (has no administration in time range)  enoxaparin (LOVENOX) injection 40 mg (has no administration in time range)  ondansetron (ZOFRAN) tablet 4 mg (has no administration in time range)    Or  ondansetron (ZOFRAN) injection 4 mg (has no administration in time range)  acetaminophen (TYLENOL) tablet 650 mg (has no administration in time range)    Or  acetaminophen (TYLENOL) suppository 650 mg (has no administration in time range)     Initial Impression / Assessment and Plan / ED Course  I have reviewed the triage vital signs and the nursing notes.  Pertinent labs & imaging results that were available during my care of the patient were reviewed by me and considered in my medical decision making (see chart for details).     Ladamien Rammel is a 77 y.o. male with a past medical history significant for chronic pulmonary hypertension, CKD, CHF, hypertension and BPH who presents at  the direction of his PCP for IV antibiotics and admission for UTI due to resistant Pseudomonas.  According to patient, he has had some chills over the last several days, dysuria, some lower abdominal discomfort with urination,  nausea, and productive cough recently.  Patient had blood work done several days ago including urinalysis.  He was started on antibiotics but culture returned today showing it is resistant to levofloxacin and he was told to come to the ED for IV antibiotics and admission.  Patient reports he has had productive cough this morning with a white sputum.  He reports his legs are always swollen and he is been taking his diuretics as directed.  No chest pain.  No flank or back pain.  No emesis.  No other complaints.    On exam, lungs are slightly coarse but no wheezing.  Abdomen nontender.  Chest nontender.  Patient has edema in both legs.  No CVA tenderness or flank tenderness.    Patient will have screen laboratory testing to look for sepsis but will also have repeat culture on urine.  He will have blood cultures.  We will speak with pharmacy to discuss antibiotic options for the patient.  He will have chest x-ray and work-up for fluid overload, productive cough, and fatigue.   Anticipate admission for IV antibiotics.     2:43 PM Spoke with pharmacy who looked at the patient's cultures.  They recommend if it is just UTI, cefepime however if there are evidence of pneumonia, would consider adding other antibiotics.  Next  Awaiting results of labs and chest x-ray before antibiotics and admission.  3:44 PM Chest x-ray shows no pneumonia.  Patient will be treated with cefepime as recommended by pharmacy and will be admitted for further IV management of his UTI.   Final Clinical Impressions(s) / ED Diagnoses   Final diagnoses:  Lower urinary tract infectious disease  Dysuria    ED Discharge Orders    None      Clinical Impression: 1. Lower urinary tract infectious disease   2. Dysuria     Disposition: Admit  This note was prepared with assistance of Dragon voice recognition software. Occasional wrong-word or sound-a-like substitutions may have occurred due to the inherent limitations of voice  recognition software.     Jaterrius Ricketson, Gwenyth Allegra, MD 03/09/18 1728

## 2018-03-09 NOTE — ED Notes (Signed)
Pt was given a [place] sandwich and Coke.

## 2018-03-09 NOTE — Telephone Encounter (Addendum)
Patient is aware of instruction. He's reluctant to go to the ED. Explained that he has an infection that can only be treated by IV antibiotics. Despite strong encouragement to go to the ED patient still states "I'm not sure. If I don't go today, maybe I'll go tomorrow".  ----- Message from Volanda Napoleon, MD sent at 03/09/2018 10:05 AM EDT ----- Roselyn Reef:  His psuedomonas is resistant to levaquin.  He needs to go to ER and probably be admitted for IV antibiotics.  Laurey Arrow

## 2018-03-09 NOTE — Progress Notes (Signed)
Pharmacy Antibiotic Note  Juan [name] is a 77 y.o. male admitted on 03/09/2018 with pseudomonal UTI.  Pharmacy has been consulted for Cefepime dosing. Was placed on Levaquin 10/16 at o/p MD office. Urine cx (10/16) from MD office growing pseudomonas (R to Cipro).   Plan: Cefepime 1gm IV q12h Will f/u micro data, pt's clinical condition, and renal function     Temp (24hrs), Avg:98.2 F (36.8 C), Min:98.2 F (36.8 C), Max:98.2 F (36.8 C)  Recent Labs  Lab 03/09/18 1450 03/09/18 1507  WBC 19.1*  --   CREATININE 1.39*  --   LATICACIDVEN  --  0.88    Estimated Creatinine Clearance: 54 mL/min (A) (by C-G formula based on SCr of 1.39 mg/dL (H)).    No Known Allergies  Antimicrobials this admission: 10/23 Cefepime >>   Microbiology results: 10/16 (o/p cx) UCx: Pseudomonas (R Cipro)   Thank you for allowing pharmacy to be a part of this patient's care.  Sherlon Handing, PharmD, BCPS Clinical pharmacist  **Pharmacist phone directory can now be found on [email or web address] ([email or web address]).  Listed under [name]. 03/09/2018 4:06 PM

## 2018-03-09 NOTE — H&P (Signed)
History and Physical    Juan [name] YIF:[id number] DOB: December 29, 1940 DOA: 03/09/2018  PCP: Juan Branch, [name]   Patient coming from: Home.  I have personally briefly reviewed patient's old medical records in [place]  Chief Complaint: Urinary tract infection.  HPI: Juan [name] is a 77 y.o. male with medical history significant of allergic rhinitis, anemia, ascending aortic aneurysm, CAD, chronic diastolic CHF, chronic respiratory failure on home oxygen, stage III chronic kidney disease, chronic lower extremities edema, hemorrhoids, history of hydronephrosis, history of splenic infarct, history of thrombocytosis, hypertension, iron deficiency, migraine headaches, moderate to severe pulmonary hypertension, myeloproliferative disease, sleep apnea not on CPAP, type 2 diabetes, rhistory of pyelonephritis, ecurrent Pseudomonas UTI who was referred by his PCP for treatment of Pseudomonas UTI after urine culture revealed resistance to Levaquin and sensitivity only to IV antibiotics.  He denies fever, but complains of chills and fatigue.  He continues to have frequency and mild dysuria.  He denies headache, fever, chills, sore throat, rhinorrhea, wheezing, hemoptysis, chest pain, dyspnea, palpitations, diaphoresis, PND, or orthopnea.  He gets frequent lower extremity edema.  He denies abdominal pain, nausea, emesis, diarrhea, constipation, melena or recent hematochezia.  No polyuria, polydipsia, polyphagia or blurred vision.  ED Course: Initial vital signs temperature 98.2 F, pulse 66, respirations 18, blood pressure 162/70 mmHg O2 sat 100% on nasal cannula oxygen.  The patient received cefepime per pharmacy dosing in the ED.  His urinalysis show a small hemoglobinuria, proteinuria 100 mg/dL, large leukocyte esterase.  RBC was only 0-5 and WBC more than 50 with rare bacteria microscopic exam.  White count was 19.1 with 48% neutrophils, 17% lymphocytes and 10% monocytes.  Hemoglobin 9.3 g/dL and platelets  148.  Lactic acid, troponin and BNP were normal.  His lipase was 30 units/L.  His CMP showed a BUN of 51, creatinine 1.39 and glucose of 111 mg/dL.  Albumin was 3.3 g/dL.  All other values are within normal limits.  Review of Systems: As per HPI otherwise 10 point review of systems negative.   Past Medical History:  Diagnosis Date  . Allergic rhinitis   . Anemia   . Ascending aortic aneurysm ([place]) 03/2014   4.3cm on CT scan  . CAD (coronary artery disease)    dx elsewheer in past, no documentation. Non-ischemic myoview 2007  . Chronic diastolic CHF (congestive heart failure), NYHA class 2 (HCC)    Normal EF w/ grade 1 dd by echo 12/2015  . Chronic respiratory failure with hypoxia/2L at home    "2L; all the time" (05/03/2017)  . CKD (chronic kidney disease), stage III ([place])   . Edema    R>L leg, u/s 5-12 neg for DVT  . Hemorrhoid   . History of hydronephrosis   . History of Splenic infarct   . History of thrombocytosis   . Hypertension   . Iron deficiency anemia due to chronic blood loss 03/31/2017  . 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 Migraine    "once/wk at least" (07/11/2013)  . Moderate to severe pulmonary hypertension ([place])   . Myeloproliferative disease ([place])   . Pyelonephritis 04/29/2017  . Recurrent Pseudomonas urinary tract infection   . Shortness of breath   . Sinus congestion   . Sleep apnea, obstructive    at some point used CPAP, was d/c  years ago  . Type II diabetes mellitus ([place])   . UTI (urinary tract infection) 06/2017  . UTI (urinary tract infection) 07/2017    Past Surgical History:  Procedure Laterality Date  . FLEXIBLE SIGMOIDOSCOPY N/A 06/08/2017   Procedure: FLEXIBLE SIGMOIDOSCOPY;  Surgeon: Juan Ada, [name];  Location: [facility];  Service: Endoscopy;  Laterality: N/A;  . IR FLUORO GUIDE CV LINE RIGHT  05/07/2017  . IR FLUORO RM 30-60 MIN  05/07/2017  . IR US GUIDE VASC ACCESS RIGHT  05/07/2017  . TOE SURGERY Right    "tried to straighten out  big toe" (07/11/2013)     reports that he quit smoking about 51 years ago. His smoking use included cigarettes. He has a 3.00 pack-year smoking history. He has never used smokeless tobacco. He reports that he does not drink alcohol or use drugs.  No Known Allergies  Family History  Problem Relation Age of Onset  . Schizophrenia Son   . Mental illness Daughter   . Juan cancer Neg Hx   . Prostate cancer Neg Hx   . Heart attack Neg Hx   . Diabetes Neg Hx     Prior to Admission medications   Medication Sig Start Date End Date Taking? Authorizing Provider  acetaminophen (TYLENOL) 325 MG tablet Take 2 tablets (650 mg total) by mouth every 6 (six) hours as needed for mild pain (or Fever >/= 101). 05/08/17  Yes Rai, Ripudeep [name], [name]  amLODipine (NORVASC) 5 MG tablet Take 1 tablet (5 mg total) by mouth daily. For BP 02/09/18  Yes Bensimhon, Shaune Pascal, [name]  aspirin EC 325 MG tablet Take 325 mg by mouth daily.    Yes Juan [name]  b complex vitamins tablet Take 1 tablet by mouth daily.   Yes Juan [name]  benzonatate (TESSALON) 100 MG capsule Take 100 mg by mouth 3 (three) times daily as needed for cough.    Yes Juan [name]  clotrimazole (LOTRIMIN AF) 1 % cream Apply to Lt foot (between) Toes bid for athletes foot Patient taking differently: Apply 1 application topically 2 (two) times daily. Apply to left foot (between) toes twice daily for athletes foot. 09/12/17  Yes Emokpae, Courage, [name]  ferrous gluconate (FERGON) 240 (27 FE) MG tablet Take 240 mg by mouth daily.   Yes Juan [name]  furosemide (LASIX) 40 MG tablet Take 80 mg (2 tab) in morning and 40mg  (1tab) in evening Patient taking differently: Take 40-80 mg by mouth See admin instructions. Take 80 mg (2 tablets) by mouth in morning and 40mg  (1 tablet) in evening. 02/09/18  Yes Bensimhon, Shaune Pascal, [name]  hydrALAZINE (APRESOLINE) 50 MG tablet Take 1.5 tablets (75 mg total) by mouth 3 (three) times daily.  02/09/18  Yes Bensimhon, Shaune Pascal, [name]  levofloxacin (LEVAQUIN) 500 MG tablet Take 1 tablet (500 mg total) by mouth daily. 03/04/18  Yes Ennever, Rudell Cobb, [name]  miconazole (MICOTIN) 2 % powder Apply 1 application topically as needed for itching.    Yes Juan [name]  mouth rinse LIQD solution 15 mLs by Mouth Rinse route 2 (two) times daily. 09/26/17  Yes Shelly Coss, [name]  ondansetron (ZOFRAN) 4 MG tablet Take 1 tablet (4 mg total) by mouth every 6 (six) hours as needed for nausea or vomiting. 09/12/17  Yes Emokpae, Courage, [name]  OXYGEN Inhale 2 L into the lungs continuous.   Yes Juan [name]  tamsulosin (FLOMAX) 0.4 MG CAPS capsule Take 1 capsule (0.4 mg total) by mouth daily after supper. 06/13/17  Yes Bonnielee Haff, [name]    Physical Exam: Vitals:   03/09/18 1545 03/09/18 1600 03/09/18 1630 03/09/18 1700  BP:   Marland Kitchen)  176/63 (!) 165/69  Pulse: 67 64 (!) 51 (!) 59  Resp: 15 15 12 11   Temp:      TempSrc:      SpO2: 100% 96% 100% 99%    Constitutional: NAD, calm, comfortable Eyes: PERRL, lids and conjunctivae normal ENMT: Mucous membranes are moist. Posterior pharynx clear of any exudate or lesions. Neck: normal, supple, no masses, no thyromegaly Respiratory: Mild rhonchi, no wheezing, no crackles. Normal respiratory effort. No accessory muscle use.  Cardiovascular: Regular rate and rhythm, no murmurs / rubs / gallops.  Bilateral lower extremities pitting edema. 2+ pedal pulses. No carotid bruits.  Abdomen: Soft, mild suprapubic tenderness, no CVA tenderness, no guarding/rebound/masses palpated. No hepatosplenomegaly. Bowel sounds positive.  Musculoskeletal: no clubbing / cyanosis. No joint deformity upper and lower extremities. Good ROM, no contractures. Normal muscle tone.  Skin: no rashes, lesions, ulcers. No induration on limited dermatological examination. Neurologic: CN 2-12 grossly intact. Sensation intact, DTR normal. Strength 5/5 in all 4.  Psychiatric: Normal  judgment and insight. Alert and oriented x 3. Normal mood.   Labs on Admission: I have personally reviewed following labs and imaging studies  CBC: Recent Labs  Lab 03/09/18 1450  WBC 19.1*  NEUTROABS 12.2*  HGB 9.3*  HCT 32.0*  MCV 87.2  PLT 702*   Basic Metabolic Panel: Recent Labs  Lab 03/09/18 1450  NA 139  [name] 3.9  CL 109  CO2 23  GLUCOSE 111*  BUN 51*  CREATININE 1.39*  CALCIUM 9.7   GFR: Estimated Creatinine Clearance: 54 mL/min (A) (by C-G formula based on SCr of 1.39 mg/dL (H)). Liver Function Tests: Recent Labs  Lab 03/09/18 1450  AST 31  ALT 24  ALKPHOS 104  BILITOT 0.3  PROT 6.5  ALBUMIN 3.3*   Recent Labs  Lab 03/09/18 1450  LIPASE 30   No results for input(s): AMMONIA in the last 168 hours. Coagulation Profile: No results for input(s): INR, PROTIME in the last 168 hours. Cardiac Enzymes: No results for input(s): CKTOTAL, CKMB, CKMBINDEX, TROPONINI in the last 168 hours. BNP (last 3 results) No results for input(s): PROBNP in the last 8760 hours. HbA1C: No results for input(s): HGBA1C in the last 72 hours. CBG: No results for input(s): GLUCAP in the last 168 hours. Lipid Profile: No results for input(s): CHOL, HDL, LDLCALC, TRIG, CHOLHDL, LDLDIRECT in the last 72 hours. Thyroid Function Tests: No results for input(s): TSH, T4TOTAL, FREET4, T3FREE, THYROIDAB in the last 72 hours. Anemia Panel: No results for input(s): VITAMINB12, FOLATE, FERRITIN, TIBC, IRON, RETICCTPCT in the last 72 hours. Urine analysis:    Component Value Date/Time   COLORURINE YELLOW 03/09/2018 1426   APPEARANCEUR HAZY (A) 03/09/2018 1426   LABSPEC 1.009 03/09/2018 1426   PHURINE 5.0 03/09/2018 1426   GLUCOSEU NEGATIVE 03/09/2018 1426   GLUCOSEU NEGATIVE 05/02/2015 1008   HGBUR SMALL (A) 03/09/2018 1426   BILIRUBINUR NEGATIVE 03/09/2018 1426   KETONESUR NEGATIVE 03/09/2018 1426   PROTEINUR 100 (A) 03/09/2018 1426   UROBILINOGEN 0.2 05/02/2015 1008   NITRITE  NEGATIVE 03/09/2018 1426   LEUKOCYTESUR LARGE (A) 03/09/2018 1426    Radiological Exams on Admission: Dg Chest 2 View  Result Date: 03/09/2018 CLINICAL DATA:  Shortness of breath, chest pain, hypertension, diabetes EXAM: CHEST - 2 VIEW COMPARISON:  09/05/2017 FINDINGS: Stable cardiomegaly with mild perihilar and basilar interstitial prominence suspicious for chronic edema versus interstitial lung disease. Chronic elevation of the right hemidiaphragm. Improvement in the left basilar atelectasis. No large effusion or  pneumothorax. Trachea is midline. Aorta atherosclerotic. Diffuse sclerosis of the visualized chest and upper extremities compatible with history of myelofibrosis. IMPRESSION: Stable cardiomegaly with mild chronic interstitial edema versus interstitial lung disease. Chronic right hemidiaphragm elevation Improving left basilar atelectasis Chronic osseous sclerosis compatible with myelofibrosis Atherosclerosis Electronically Signed   By: Jerilynn Mages.  Shick M.D.   On: 03/09/2018 15:29    EKG: Independently reviewed.  Vent. rate 66 BPM PR interval * ms QRS duration 103 ms QT/QTc 432/453 ms P-R-T axes 38 1 91 Sinus rhythm Probable left ventricular hypertrophy.  Assessment/Plan Principal Problem:   Pseudomonas urinary tract infection Admit to MedSurg/inpatient. Continue cefepime per pharmacy. Follow-up blood culture and sensitivity. Follow-up urine culture and sensitivity.  Active Problems:   Coronary atherosclerosis Continue aspirin and amlodipine. Not on beta-blocker.    BPH (benign prostatic hyperplasia) Continue daily Flomax.    CKD (chronic kidney disease) stage 3, GFR 30-59 ml/min (HCC) Follow-up renal function electrolytes.    Iron deficiency anemia due to chronic blood loss Continue iron supplementation. Monitor hematocrit and hemoglobin.    DVT prophylaxis: SQ Lovenox. Code Status: Full code. Family Communication: Disposition Plan: Admit for IV antibiotic therapy  for 2 to 3 days. Consults called: Admission status: Inpatient/MedSurg.   Reubin Milan [name] [facility] Pager [phone].  If 7PM-7AM, please contact night-coverage [email or web address] Password [facility]  03/09/2018, 6:38 PM

## 2018-03-10 ENCOUNTER — Telehealth (HOSPITAL_COMMUNITY): Payer: Self-pay

## 2018-03-10 ENCOUNTER — Other Ambulatory Visit: Payer: Self-pay

## 2018-03-10 DIAGNOSIS — I25118 Atherosclerotic heart disease of native coronary artery with other forms of angina pectoris: Secondary | ICD-10-CM

## 2018-03-10 LAB — CBC WITH DIFFERENTIAL/PLATELET
Abs Immature Granulocytes: 3.15 10*3/uL — ABNORMAL HIGH (ref 0.00–0.07)
BASOS PCT: 3 %
Basophils Absolute: 0.7 10*3/uL — ABNORMAL HIGH (ref 0.0–0.1)
EOS ABS: 0.4 10*3/uL (ref 0.0–0.5)
Eosinophils Relative: 2 %
HEMATOCRIT: 30 % — AB (ref 39.0–52.0)
HEMOGLOBIN: 8.7 g/dL — AB (ref 13.0–17.0)
IMMATURE GRANULOCYTES: 16 %
Lymphocytes Relative: 11 %
Lymphs Abs: 2.1 10*3/uL (ref 0.7–4.0)
MCH: 24.9 pg — AB (ref 26.0–34.0)
MCHC: 29 g/dL — ABNORMAL LOW (ref 30.0–36.0)
MCV: 86 fL (ref 80.0–100.0)
MONO ABS: 4.3 10*3/uL — AB (ref 0.1–1.0)
Monocytes Relative: 22 %
Neutro Abs: 8.8 10*3/uL — ABNORMAL HIGH (ref 1.7–7.7)
Neutrophils Relative %: 46 %
PLATELETS: 153 10*3/uL (ref 150–400)
RBC: 3.49 MIL/uL — AB (ref 4.22–5.81)
RDW: 17.5 % — ABNORMAL HIGH (ref 11.5–15.5)
WBC: 19.3 10*3/uL — AB (ref 4.0–10.5)
nRBC: 4 % — ABNORMAL HIGH (ref 0.0–0.2)

## 2018-03-10 LAB — COMPREHENSIVE METABOLIC PANEL
ALK PHOS: 93 U/L (ref 38–126)
ALT: 23 U/L (ref 0–44)
AST: 29 U/L (ref 15–41)
Albumin: 3.2 g/dL — ABNORMAL LOW (ref 3.5–5.0)
Anion gap: 6 (ref 5–15)
BILIRUBIN TOTAL: 0.5 mg/dL (ref 0.3–1.2)
BUN: 49 mg/dL — AB (ref 8–23)
CALCIUM: 9.7 mg/dL (ref 8.9–10.3)
CO2: 22 mmol/L (ref 22–32)
Chloride: 110 mmol/L (ref 98–111)
Creatinine, Ser: 1.47 mg/dL — ABNORMAL HIGH (ref 0.61–1.24)
GFR, EST AFRICAN AMERICAN: 52 mL/min — AB (ref >60)
GFR, EST NON AFRICAN AMERICAN: 45 mL/min — AB (ref >60)
Glucose, Bld: 124 mg/dL — ABNORMAL HIGH (ref 70–99)
Potassium: 3.8 mmol/L (ref 3.5–5.1)
Sodium: 138 mmol/L (ref 135–145)
Total Protein: 6.2 g/dL — ABNORMAL LOW (ref 6.5–8.1)

## 2018-03-10 LAB — MRSA PCR SCREENING: MRSA by PCR: NEGATIVE

## 2018-03-10 MED ORDER — FOLIC ACID 1 MG PO TABS
1.0000 mg | ORAL_TABLET | Freq: Every day | ORAL | Status: DC
  Administered 2018-03-11 – 2018-03-15 (×5): 1 mg via ORAL
  Filled 2018-03-10 (×5): qty 1

## 2018-03-10 NOTE — Progress Notes (Signed)
PROGRESS NOTE                                                                                                                                                                                                             Patient Demographics:    Juan [name], is a 77 y.o. male, DOB - 1940/07/18, FTD:[id number]  Admit date - 03/09/2018   Admitting Physician Reubin Milan, MD  Outpatient Primary MD for the patient is Colon Branch, MD  LOS - 1   Chief Complaint  Patient presents with  . Dysuria       Brief Narrative    77 y.o. male with medical history significant of allergic rhinitis, anemia, ascending aortic aneurysm, CAD, chronic diastolic CHF, chronic respiratory failure on home oxygen, stage III chronic kidney disease, chronic lower extremities edema, hemorrhoids, history of hydronephrosis, history of splenic infarct, history of thrombocytosis, hypertension, iron deficiency, migraine headaches, moderate to severe pulmonary hypertension, myeloproliferative disease, sleep apnea not on CPAP, type 2 diabetes, rhistory of pyelonephritis, ecurrent Pseudomonas UTI who was referred by his PCP for treatment of Pseudomonas UTI after urine culture revealed resistance to Levaquin and sensitivity only to IV antibiotics  Subjective:    Juan [name] today has, No headache, No chest pain, No abdominal pain -to report some dysuria and polyuria.  Assessment  & Plan :    Principal Problem:   Pseudomonas urinary tract infection Active Problems:   Coronary atherosclerosis   BPH (benign prostatic hyperplasia)   CKD (chronic kidney disease) stage 3, GFR 30-59 ml/min (HCC)   Iron deficiency anemia due to chronic blood loss  Pseudomonas urinary tract infection -She is reports previous UTI infections, by reviewing previous urine cultures, apparently all of them growing Pseudomonas resistant to oral regimen, does report dysuria, polyuria, chills, he does have leukocytosis  as well. -We will continue with IV cefepime for now, as urine culture done at PCP office 03/02/2018 showing Pseudomonas resistant to quinolones .follow on urine culture and blood culture obtained on admission  Coronary atherosclerosis -Continue aspirin and amlodipine.  Denies any chest pain or shortness of breath    BPH (benign prostatic hyperplasia) Continue daily Flomax.    CKD (chronic kidney disease) stage 3, GFR 30-59 ml/min (HCC) Follow-up renal function electrolytes.  Anemia -He is being followed at [facility] for his  anemia, receiving Procrit as needed, he is with known history of a chronic myeloproliferative disorder.     Code Status : full  Family Communication  : None at bedside  Disposition Plan  : Home when stable   Consults  :  none  Procedures  : none  DVT Prophylaxis  :  lovenox  Lab Results  Component Value Date   PLT 153 03/10/2018    Antibiotics  :   Anti-infectives (From admission, onward)   Start     Dose/Rate Route Frequency Ordered Stop   03/09/18 1830  levofloxacin (LEVAQUIN) tablet 500 mg  Status:  Discontinued     500 mg Oral Daily 03/09/18 1820 03/09/18 1837   03/09/18 1630  ceFEPIme (MAXIPIME) 1 g in sodium chloride 0.9 % 100 mL IVPB     1 g 200 mL/hr over 30 Minutes Intravenous Every 12 hours 03/09/18 1548          Objective:   Vitals:   03/09/18 1700 03/09/18 2129 03/10/18 0449 03/10/18 1008  BP: (!) 165/69 (!) 162/69 (!) 142/65 (!) 144/78  Pulse: (!) 59 64 65   Resp: 11 19 18    Temp:  98.1 F (36.7 C) 98 F (36.7 C)   TempSrc:  Oral Oral   SpO2: 99% 94% 100%     Wt Readings from Last 3 Encounters:  03/02/18 86.6 kg  03/02/18 86.6 kg  02/28/18 87 kg     Intake/Output Summary (Last 24 hours) at 03/10/2018 1344 Last data filed at 03/10/2018 1153 Gross per 24 hour  Intake 420 ml  Output 1400 ml  Net -980 ml     Physical Exam  Awake Alert, Oriented X 3, No new F.N deficits, Normal  affect Symmetrical Chest wall movement, Good air movement bilaterally, CTAB RRR,No Gallops,Rubs or new Murmurs, No Parasternal Heave +ve B.Sounds, Abd Soft, mild suprapubic tenderness, No rebound - guarding or rigidity. No Cyanosis, Clubbing or edema, No new Rash or bruise      Data Review:    CBC Recent Labs  Lab 03/09/18 1450 03/10/18 0356  WBC 19.1* 19.3*  HGB 9.3* 8.7*  HCT 32.0* 30.0*  PLT 148* 153  MCV 87.2 86.0  MCH 25.3* 24.9*  MCHC 29.1* 29.0*  RDW 17.6* 17.5*  LYMPHSABS 3.2 2.1  MONOABS 1.9* 4.3*  EOSABS 0.2 0.4  BASOSABS 1.5* 0.7*    Chemistries  Recent Labs  Lab 03/09/18 1450 03/10/18 0356  NA 139 138  K 3.9 3.8  CL 109 110  CO2 23 22  GLUCOSE 111* 124*  BUN 51* 49*  CREATININE 1.39* 1.47*  CALCIUM 9.7 9.7  AST 31 29  ALT 24 23  ALKPHOS 104 93  BILITOT 0.3 0.5   ------------------------------------------------------------------------------------------------------------------ No results for input(s): CHOL, HDL, LDLCALC, TRIG, CHOLHDL, LDLDIRECT in the last 72 hours.  Lab Results  Component Value Date   HGBA1C 5.1 08/18/2017   ------------------------------------------------------------------------------------------------------------------ No results for input(s): TSH, T4TOTAL, T3FREE, THYROIDAB in the last 72 hours.  Invalid input(s): FREET3 ------------------------------------------------------------------------------------------------------------------ No results for input(s): VITAMINB12, FOLATE, FERRITIN, TIBC, IRON, RETICCTPCT in the last 72 hours.  Coagulation profile No results for input(s): INR, PROTIME in the last 168 hours.  No results for input(s): DDIMER in the last 72 hours.  Cardiac Enzymes No results for input(s): CKMB, TROPONINI, MYOGLOBIN in the last 168 hours.  Invalid input(s): CK ------------------------------------------------------------------------------------------------------------------    Component Value  Date/Time   BNP 193.7 (H) 03/09/2018 1450    Inpatient Medications  Scheduled Meds: .  amLODipine  5 mg Oral Daily  . aspirin EC  325 mg Oral Daily  . B-complex with vitamin C  1 tablet Oral Daily  . clotrimazole  1 application Topical BID  . enoxaparin (LOVENOX) injection  40 mg Subcutaneous Q24H  . ferrous gluconate  324 mg Oral Daily  . fluticasone  1 spray Each Nare Daily  . furosemide  40 mg Oral QPC supper  . furosemide  80 mg Oral Daily  . hydrALAZINE  75 mg Oral TID   Continuous Infusions: . ceFEPime (MAXIPIME) IV 1 g (03/10/18 1203)   PRN Meds:.acetaminophen **OR** acetaminophen, benzonatate, ondansetron **OR** ondansetron (ZOFRAN) IV  Micro Results Recent Results (from the past 240 hour(s))  Urine Culture     Status: Abnormal   Collection Time: 03/02/18 12:28 PM  Result Value Ref Range Status   Specimen Description   Final    URINE, CLEAN CATCH Performed at [facility] at [facility], [address], [place], [place] [address]    Special Requests   Final    NONE Performed at [facility] at [facility], [address], [place], [place] [address]    Culture >=100,000 COLONIES/mL PSEUDOMONAS AERUGINOSA (A)  Final   Report Status 03/05/2018 FINAL  Final   Organism ID, Bacteria PSEUDOMONAS AERUGINOSA (A)  Final      Susceptibility   Pseudomonas aeruginosa - MIC*    CEFTAZIDIME <=1 SENSITIVE Sensitive     CIPROFLOXACIN >=4 RESISTANT Resistant     GENTAMICIN <=1 SENSITIVE Sensitive     IMIPENEM 2 SENSITIVE Sensitive     PIP/TAZO <=4 SENSITIVE Sensitive     CEFEPIME 4 SENSITIVE Sensitive     * >=100,000 COLONIES/mL PSEUDOMONAS AERUGINOSA  Urine culture     Status: Abnormal (Preliminary result)   Collection Time: 03/09/18  2:32 PM  Result Value Ref Range Status   Specimen Description URINE, RANDOM  Final   Special Requests   Final    NONE Performed at [facility], [place] [address] [place], [place]  [address]    Culture 70,000 COLONIES/mL GRAM NEGATIVE RODS (A)  Final   Report Status PENDING  Incomplete  Blood culture (routine x 2)     Status: None (Preliminary result)   Collection Time: 03/09/18  2:50 PM  Result Value Ref Range Status   Specimen Description BLOOD RIGHT ANTECUBITAL  Final   Special Requests   Final    BOTTLES DRAWN AEROBIC AND ANAEROBIC Blood Culture adequate volume   Culture   Final    NO GROWTH < 24 HOURS Performed at [facility], [place] [address] [place], [place] [address]    Report Status PENDING  Incomplete  Blood culture (routine x 2)     Status: None (Preliminary result)   Collection Time: 03/09/18  4:04 PM  Result Value Ref Range Status   Specimen Description BLOOD LEFT HAND  Final   Special Requests   Final    BOTTLES DRAWN AEROBIC AND ANAEROBIC Blood Culture adequate volume   Culture   Final    NO GROWTH < 24 HOURS Performed at [facility], [place] [address] [place], [place] [address]    Report Status PENDING  Incomplete  MRSA PCR Screening     Status: None   Collection Time: 03/10/18  3:48 AM  Result Value Ref Range Status   MRSA by PCR NEGATIVE NEGATIVE Final    Comment:  The GeneXpert MRSA Assay (FDA approved for NASAL specimens only), is one component of a comprehensive MRSA colonization surveillance program. It is not intended to diagnose MRSA infection nor to guide or monitor treatment for MRSA infections. Performed at [facility], [place] [address] [place], [place] [address]     Radiology Reports Dg Chest 2 View  Result Date: 03/09/2018 CLINICAL DATA:  Shortness of breath, chest pain, hypertension, diabetes EXAM: CHEST - 2 VIEW COMPARISON:  09/05/2017 FINDINGS: Stable cardiomegaly with mild perihilar and basilar interstitial prominence suspicious for chronic edema versus interstitial lung disease. Chronic elevation of the right hemidiaphragm. Improvement in the left basilar atelectasis. No large effusion or  pneumothorax. Trachea is midline. Aorta atherosclerotic. Diffuse sclerosis of the visualized chest and upper extremities compatible with history of myelofibrosis. IMPRESSION: Stable cardiomegaly with mild chronic interstitial edema versus interstitial lung disease. Chronic right hemidiaphragm elevation Improving left basilar atelectasis Chronic osseous sclerosis compatible with myelofibrosis Atherosclerosis Electronically Signed   By: Jerilynn Mages.  Shick M.D.   On: 03/09/2018 15:29     Phillips Climes M.D on 03/10/2018 at 1:44 PM  Between 7am to 7pm - Pager - [phone]  After 7pm go to [email or web address] - password [facility]  [facility] -  Office  [phone]

## 2018-03-10 NOTE — Telephone Encounter (Signed)
error 

## 2018-03-10 NOTE — Consult Note (Signed)
   [facility] CM Inpatient Consult   03/10/2018  Juan [name] [birth year] [id number]  Patient screened for extreme high risk score for unplanned readmissions with Medicare in [facility]. Went to bedside to offer and explain [facility] Care Management program with patient.  Patient denies having any needs except with his finances. States, "I feel that I have fallen on hard times and can't pay for a lot of stuff right now but, I call the people and they tell me to pay what I can. So far, I am making it." Explained [facility] Care Management services and he states, "I know who you are."   He is followed by the Advanced HF team.  He does not want any services from [facility] right now. He denies any home health.   His daughter calls him and his son lives with him and has his own medical problems he says.  He has been offered [facility] services and has been unable to progress with his care plan. He currently states he doesn't feel he needs care management but to check back before he leaves.    For questions, please contact:  [name], RN BSN [facility] Liaison  [phone] business mobile phone Toll free office [phone]

## 2018-03-11 LAB — CBC
HEMATOCRIT: 33.5 % — AB (ref 39.0–52.0)
Hemoglobin: 9.5 g/dL — ABNORMAL LOW (ref 13.0–17.0)
MCH: 24.7 pg — ABNORMAL LOW (ref 26.0–34.0)
MCHC: 28.4 g/dL — ABNORMAL LOW (ref 30.0–36.0)
MCV: 87 fL (ref 80.0–100.0)
NRBC: 3 % — AB (ref 0.0–0.2)
PLATELETS: 189 10*3/uL (ref 150–400)
RBC: 3.85 MIL/uL — AB (ref 4.22–5.81)
RDW: 17.9 % — ABNORMAL HIGH (ref 11.5–15.5)
WBC: 22.3 10*3/uL — ABNORMAL HIGH (ref 4.0–10.5)

## 2018-03-11 LAB — BASIC METABOLIC PANEL
ANION GAP: 11 (ref 5–15)
BUN: 42 mg/dL — AB (ref 8–23)
CO2: 22 mmol/L (ref 22–32)
Calcium: 10.3 mg/dL (ref 8.9–10.3)
Chloride: 108 mmol/L (ref 98–111)
Creatinine, Ser: 1.42 mg/dL — ABNORMAL HIGH (ref 0.61–1.24)
GFR calc Af Amer: 54 mL/min — ABNORMAL LOW (ref >60)
GFR calc non Af Amer: 46 mL/min — ABNORMAL LOW (ref >60)
GLUCOSE: 120 mg/dL — AB (ref 70–99)
POTASSIUM: 3.9 mmol/L (ref 3.5–5.1)
Sodium: 141 mmol/L (ref 135–145)

## 2018-03-11 LAB — URINE CULTURE

## 2018-03-11 LAB — VITAMIN B12: Vitamin B-12: 1325 pg/mL — ABNORMAL HIGH (ref 180–914)

## 2018-03-11 NOTE — Plan of Care (Signed)

## 2018-03-11 NOTE — Progress Notes (Signed)
PROGRESS NOTE                                                                                                                                                                                                             Patient Demographics:    Juan [name], is a 77 y.o. male, DOB - 1940/10/18, JJH:[id number]  Admit date - 03/09/2018   Admitting Physician Reubin Milan, MD  Outpatient Primary MD for the patient is Colon Branch, MD  LOS - 2   Chief Complaint  Patient presents with  . Dysuria       Brief Narrative    77 y.o. male with medical history significant of allergic rhinitis, anemia, ascending aortic aneurysm, CAD, chronic diastolic CHF, chronic respiratory failure on home oxygen, stage III chronic kidney disease, chronic lower extremities edema, hemorrhoids, history of hydronephrosis, history of splenic infarct, history of thrombocytosis, hypertension, iron deficiency, migraine headaches, moderate to severe pulmonary hypertension, myeloproliferative disease, sleep apnea not on CPAP, type 2 diabetes, rhistory of pyelonephritis, ecurrent Pseudomonas UTI who was referred by his PCP for treatment of Pseudomonas UTI after urine culture revealed resistance to Levaquin and sensitivity only to IV antibiotic.   Subjective:    Juan [name] today has, No headache, No chest pain, No abdominal pain -does report some generalized weakness.   Assessment  & Plan :    Principal Problem:   Pseudomonas urinary tract infection Active Problems:   Coronary atherosclerosis   BPH (benign prostatic hyperplasia)   CKD (chronic kidney disease) stage 3, GFR 30-59 ml/min (HCC)   Iron deficiency anemia due to chronic blood loss  Pseudomonas urinary tract infection -He reports previous UTI infections, by reviewing previous urine cultures, apparently all of them growing Pseudomonas resistant to oral regimen, does report dysuria, polyuria, chills, he does have  leukocytosis as well. -Patient with multiple UTIs in the past which has been treated with oral regimen, all previous urine cultures growing Pseudomonas sensitive only to IV regimen, I have discussed with ID, recommendation is to treat for 5 to 7 days on IV regimen, I will continue with IV cefepime .  Have discussed with the patient, he does not want PICC line, so he will have to in the hospital for IV antibiotic administration. -With worsening leukocytosis, will monitor closely, but he is nontoxic-appearing  Coronary atherosclerosis -Continue  aspirin and amlodipine.  Denies any chest pain or shortness of breath  BPH (benign prostatic hyperplasia) Continue daily Flomax.    CKD (chronic kidney disease) stage 3, GFR 30-59 ml/min (HCC) Follow-up renal function electrolytes.  Anemia -He is being followed at [facility] for his anemia, receiving Procrit as needed, he is with known history of a chronic myeloproliferative disorder.     Code Status : full  Family Communication  : None at bedside  Disposition Plan  : Home when stable   Consults  :  none  Procedures  : none  DVT Prophylaxis  :  lovenox  Lab Results  Component Value Date   PLT 189 03/11/2018    Antibiotics  :   Anti-infectives (From admission, onward)   Start     Dose/Rate Route Frequency Ordered Stop   03/09/18 1830  levofloxacin (LEVAQUIN) tablet 500 mg  Status:  Discontinued     500 mg Oral Daily 03/09/18 1820 03/09/18 1837   03/09/18 1630  ceFEPIme (MAXIPIME) 1 g in sodium chloride 0.9 % 100 mL IVPB     1 g 200 mL/hr over 30 Minutes Intravenous Every 12 hours 03/09/18 1548          Objective:   Vitals:   03/10/18 1526 03/10/18 2127 03/11/18 0454 03/11/18 1315  BP: 137/72 132/70 (!) 145/73 (!) 149/62  Pulse: 70 73 69 74  Resp: 16 18 18 16   Temp: 97.9 F (36.6 C) 98 F (36.7 C) 97.8 F (36.6 C) 98 F (36.7 C)  TempSrc: Oral Oral Oral Oral  SpO2: 97% 95% 100% 97%    Wt Readings  from Last 3 Encounters:  03/02/18 86.6 kg  03/02/18 86.6 kg  02/28/18 87 kg     Intake/Output Summary (Last 24 hours) at 03/11/2018 1349 Last data filed at 03/11/2018 1039 Gross per 24 hour  Intake -  Output 3000 ml  Net -3000 ml     Physical Exam  Awake Alert, Oriented X 3, No new F.N deficits, Normal affect Symmetrical Chest wall movement, Good air movement bilaterally, CTAB RRR,No Gallops,Rubs or new Murmurs, No Parasternal Heave +ve B.Sounds, Abd Soft, No tenderness, No rebound - guarding or rigidity. No Cyanosis, Clubbing or edema, No new Rash or bruise       Data Review:    CBC Recent Labs  Lab 03/09/18 1450 03/10/18 0356 03/11/18 0511  WBC 19.1* 19.3* 22.3*  HGB 9.3* 8.7* 9.5*  HCT 32.0* 30.0* 33.5*  PLT 148* 153 189  MCV 87.2 86.0 87.0  MCH 25.3* 24.9* 24.7*  MCHC 29.1* 29.0* 28.4*  RDW 17.6* 17.5* 17.9*  LYMPHSABS 3.2 2.1  --   MONOABS 1.9* 4.3*  --   EOSABS 0.2 0.4  --   BASOSABS 1.5* 0.7*  --     Chemistries  Recent Labs  Lab 03/09/18 1450 03/10/18 0356 03/11/18 0511  NA 139 138 141  K 3.9 3.8 3.9  CL 109 110 108  CO2 23 22 22   GLUCOSE 111* 124* 120*  BUN 51* 49* 42*  CREATININE 1.39* 1.47* 1.42*  CALCIUM 9.7 9.7 10.3  AST 31 29  --   ALT 24 23  --   ALKPHOS 104 93  --   BILITOT 0.3 0.5  --    ------------------------------------------------------------------------------------------------------------------ No results for input(s): CHOL, HDL, LDLCALC, TRIG, CHOLHDL, LDLDIRECT in the last 72 hours.  Lab Results  Component Value Date   HGBA1C 5.1 08/18/2017   ------------------------------------------------------------------------------------------------------------------ No results for input(s): TSH,  T4TOTAL, T3FREE, THYROIDAB in the last 72 hours.  Invalid input(s): FREET3 ------------------------------------------------------------------------------------------------------------------ Recent Labs    03/11/18 0511    VITAMINB12 1,325*    Coagulation profile No results for input(s): INR, PROTIME in the last 168 hours.  No results for input(s): DDIMER in the last 72 hours.  Cardiac Enzymes No results for input(s): CKMB, TROPONINI, MYOGLOBIN in the last 168 hours.  Invalid input(s): CK ------------------------------------------------------------------------------------------------------------------    Component Value Date/Time   BNP 193.7 (H) 03/09/2018 1450    Inpatient Medications  Scheduled Meds: . amLODipine  5 mg Oral Daily  . aspirin EC  325 mg Oral Daily  . B-complex with vitamin C  1 tablet Oral Daily  . clotrimazole  1 application Topical BID  . enoxaparin (LOVENOX) injection  40 mg Subcutaneous Q24H  . ferrous gluconate  324 mg Oral Daily  . fluticasone  1 spray Each Nare Daily  . folic acid  1 mg Oral Daily  . furosemide  40 mg Oral QPC supper  . furosemide  80 mg Oral Daily  . hydrALAZINE  75 mg Oral TID   Continuous Infusions: . ceFEPime (MAXIPIME) IV 1 g (03/11/18 1245)   PRN Meds:.acetaminophen **OR** acetaminophen, benzonatate, ondansetron **OR** ondansetron (ZOFRAN) IV  Micro Results Recent Results (from the past 240 hour(s))  Urine Culture     Status: Abnormal   Collection Time: 03/02/18 12:28 PM  Result Value Ref Range Status   Specimen Description   Final    URINE, CLEAN CATCH Performed at [facility] at [facility], [address], [place], [place] [address]    Special Requests   Final    NONE Performed at [facility] at [facility], [address] [place], [place] [address]    Culture >=100,000 COLONIES/mL PSEUDOMONAS AERUGINOSA (A)  Final   Report Status 03/05/2018 FINAL  Final   Organism ID, Bacteria PSEUDOMONAS AERUGINOSA (A)  Final      Susceptibility   Pseudomonas aeruginosa - MIC*    CEFTAZIDIME <=1 SENSITIVE Sensitive     CIPROFLOXACIN >=4 RESISTANT Resistant     GENTAMICIN <=1 SENSITIVE  Sensitive     IMIPENEM 2 SENSITIVE Sensitive     PIP/TAZO <=4 SENSITIVE Sensitive     CEFEPIME 4 SENSITIVE Sensitive     * >=100,000 COLONIES/mL PSEUDOMONAS AERUGINOSA  Urine culture     Status: Abnormal   Collection Time: 03/09/18  2:32 PM  Result Value Ref Range Status   Specimen Description URINE, RANDOM  Final   Special Requests   Final    NONE Performed at [facility], [place] [address] [place], [place] [address]    Culture 70,000 COLONIES/mL PSEUDOMONAS AERUGINOSA (A)  Final   Report Status 03/11/2018 FINAL  Final   Organism ID, Bacteria PSEUDOMONAS AERUGINOSA (A)  Final      Susceptibility   Pseudomonas aeruginosa - MIC*    CEFTAZIDIME 4 SENSITIVE Sensitive     CIPROFLOXACIN >=4 RESISTANT Resistant     GENTAMICIN 4 SENSITIVE Sensitive     IMIPENEM 2 SENSITIVE Sensitive     PIP/TAZO 32 SENSITIVE Sensitive     CEFEPIME 8 SENSITIVE Sensitive     * 70,000 COLONIES/mL PSEUDOMONAS AERUGINOSA  Blood culture (routine x 2)     Status: None (Preliminary result)   Collection Time: 03/09/18  2:50 PM  Result Value Ref Range Status   Specimen Description BLOOD RIGHT ANTECUBITAL  Final   Special Requests  Final    BOTTLES DRAWN AEROBIC AND ANAEROBIC Blood Culture adequate volume   Culture   Final    NO GROWTH 2 DAYS Performed at [facility], [place] [address] [place], [place] [address]    Report Status PENDING  Incomplete  Blood culture (routine x 2)     Status: None (Preliminary result)   Collection Time: 03/09/18  4:04 PM  Result Value Ref Range Status   Specimen Description BLOOD LEFT HAND  Final   Special Requests   Final    BOTTLES DRAWN AEROBIC AND ANAEROBIC Blood Culture adequate volume   Culture   Final    NO GROWTH 2 DAYS Performed at [facility], [place] [address], [place], [place] [address]    Report Status PENDING  Incomplete  MRSA PCR Screening     Status: None   Collection Time: 03/10/18  3:48 AM  Result Value Ref Range Status   MRSA by PCR  NEGATIVE NEGATIVE Final    Comment:        The GeneXpert MRSA Assay (FDA approved for NASAL specimens only), is one component of a comprehensive MRSA colonization surveillance program. It is not intended to diagnose MRSA infection nor to guide or monitor treatment for MRSA infections. Performed at [facility], [place] [address] [place], [place] [address]     Radiology Reports Dg Chest 2 View  Result Date: 03/09/2018 CLINICAL DATA:  Shortness of breath, chest pain, hypertension, diabetes EXAM: CHEST - 2 VIEW COMPARISON:  09/05/2017 FINDINGS: Stable cardiomegaly with mild perihilar and basilar interstitial prominence suspicious for chronic edema versus interstitial lung disease. Chronic elevation of the right hemidiaphragm. Improvement in the left basilar atelectasis. No large effusion or pneumothorax. Trachea is midline. Aorta atherosclerotic. Diffuse sclerosis of the visualized chest and upper extremities compatible with history of myelofibrosis. IMPRESSION: Stable cardiomegaly with mild chronic interstitial edema versus interstitial lung disease. Chronic right hemidiaphragm elevation Improving left basilar atelectasis Chronic osseous sclerosis compatible with myelofibrosis Atherosclerosis Electronically Signed   By: Jerilynn Mages.  Shick M.D.   On: 03/09/2018 15:29     Phillips Climes M.D on 03/11/2018 at 1:49 PM  Between 7am to 7pm - Pager - [phone]  After 7pm go to [email or web address] - password [facility]  [facility] -  Office  [phone]

## 2018-03-11 NOTE — Care Management Important Message (Signed)
Important Message  Patient Details  Name: Birt Reinoso MRN: [id number] Date of Birth: 03-May-1941   Medicare Important Message Given:  No Due to illness patient not able to sign/Unsigned copy left   Epifanio Labrador 03/11/2018, 2:07 PM

## 2018-03-12 LAB — CBC
HEMATOCRIT: 29.9 % — AB (ref 39.0–52.0)
HEMOGLOBIN: 8.6 g/dL — AB (ref 13.0–17.0)
MCH: 24.8 pg — AB (ref 26.0–34.0)
MCHC: 28.8 g/dL — ABNORMAL LOW (ref 30.0–36.0)
MCV: 86.2 fL (ref 80.0–100.0)
NRBC: 2.1 % — AB (ref 0.0–0.2)
Platelets: 154 10*3/uL (ref 150–400)
RBC: 3.47 MIL/uL — ABNORMAL LOW (ref 4.22–5.81)
RDW: 17.6 % — ABNORMAL HIGH (ref 11.5–15.5)
WBC: 19.8 10*3/uL — AB (ref 4.0–10.5)

## 2018-03-12 LAB — BASIC METABOLIC PANEL
Anion gap: 7 (ref 5–15)
BUN: 45 mg/dL — ABNORMAL HIGH (ref 8–23)
CHLORIDE: 109 mmol/L (ref 98–111)
CO2: 22 mmol/L (ref 22–32)
Calcium: 9.7 mg/dL (ref 8.9–10.3)
Creatinine, Ser: 1.6 mg/dL — ABNORMAL HIGH (ref 0.61–1.24)
GFR calc non Af Amer: 40 mL/min — ABNORMAL LOW (ref >60)
GFR, EST AFRICAN AMERICAN: 47 mL/min — AB (ref >60)
Glucose, Bld: 107 mg/dL — ABNORMAL HIGH (ref 70–99)
POTASSIUM: 4.2 mmol/L (ref 3.5–5.1)
Sodium: 138 mmol/L (ref 135–145)

## 2018-03-12 MED ORDER — POLYETHYLENE GLYCOL 3350 17 G PO PACK
17.0000 g | PACK | Freq: Two times a day (BID) | ORAL | Status: AC
  Administered 2018-03-12 – 2018-03-13 (×4): 17 g via ORAL
  Filled 2018-03-12 (×4): qty 1

## 2018-03-12 MED ORDER — SODIUM CHLORIDE 0.9 % IV SOLN
1.0000 g | INTRAVENOUS | Status: DC
  Administered 2018-03-13 – 2018-03-14 (×2): 1 g via INTRAVENOUS
  Filled 2018-03-12 (×3): qty 1

## 2018-03-12 MED ORDER — SODIUM CHLORIDE 0.9 % IV SOLN
INTRAVENOUS | Status: DC
  Administered 2018-03-12 – 2018-03-13 (×3): via INTRAVENOUS

## 2018-03-12 NOTE — Progress Notes (Signed)
PROGRESS NOTE                                                                                                                                                                                                             Patient Demographics:    Juan [name], is a 77 y.o. male, DOB - 1941-03-07, KCL:[id number]  Admit date - 03/09/2018   Admitting Physician Juan Milan, MD  Outpatient Primary MD for the patient is Juan Branch, MD  LOS - 3   Chief Complaint  Patient presents with  . Dysuria       Brief Narrative    77 y.o. male with medical history significant of allergic rhinitis, anemia, ascending aortic aneurysm, CAD, chronic diastolic CHF, chronic respiratory failure on home oxygen, stage III chronic kidney disease, chronic lower extremities edema, hemorrhoids, history of hydronephrosis, history of splenic infarct, history of thrombocytosis, hypertension, iron deficiency, migraine headaches, moderate to severe pulmonary hypertension, myeloproliferative disease, sleep apnea not on CPAP, type 2 diabetes, rhistory of pyelonephritis, ecurrent Pseudomonas UTI who was referred by his PCP for treatment of Pseudomonas UTI after urine culture revealed resistance to Levaquin and sensitivity only to IV antibiotic.   Subjective:    Juan [name] today has, No headache, No chest pain, No abdominal pain -reports some constipation   Assessment  & Plan :    Principal Problem:   Pseudomonas urinary tract infection Active Problems:   Coronary atherosclerosis   BPH (benign prostatic hyperplasia)   CKD (chronic kidney disease) stage 3, GFR 30-59 ml/min (HCC)   Iron deficiency anemia due to chronic blood loss  Pseudomonas urinary tract infection -He reports previous UTI infections, by reviewing previous urine cultures, apparently all of them growing Pseudomonas resistant to oral regimen, does report dysuria, polyuria, chills, he does have leukocytosis as  well. -Patient with multiple UTIs in the past which has been treated with oral regimen, all previous urine cultures growing Pseudomonas sensitive only to IV regimen, I have discussed with ID, recommendation is to treat for 5 to 7 days on IV regimen, I will continue with IV cefepime .  Have discussed with the patient, he does not want PICC line, so he will have to in the hospital for IV antibiotic administration. -With worsening leukocytosis, will monitor closely, but he is nontoxic-appearing  Coronary atherosclerosis -Continue aspirin and  amlodipine.  Denies any chest pain or shortness of breath  BPH (benign prostatic hyperplasia) Continue daily Flomax.  AKI on  CKD (chronic kidney disease) stage 3, GFR 30-59 ml/min (HCC) -Creatinine has increased to 1.6 today, I will hold his Lasix, and start on IV fluids,  Anemia -He is being followed at [facility] for his anemia, receiving Procrit as needed, he is with known history of a chronic myeloproliferative disorder.     Code Status : full  Family Communication  : None at bedside  Disposition Plan  : Home when stable   Consults  :  none  Procedures  : none  DVT Prophylaxis  :  lovenox  Lab Results  Component Value Date   PLT 154 03/12/2018    Antibiotics  :   Anti-infectives (From admission, onward)   Start     Dose/Rate Route Frequency Ordered Stop   03/09/18 1830  levofloxacin (LEVAQUIN) tablet 500 mg  Status:  Discontinued     500 mg Oral Daily 03/09/18 1820 03/09/18 1837   03/09/18 1630  ceFEPIme (MAXIPIME) 1 g in sodium chloride 0.9 % 100 mL IVPB     1 g 200 mL/hr over 30 Minutes Intravenous Every 12 hours 03/09/18 1548          Objective:   Vitals:   03/11/18 0454 03/11/18 1315 03/11/18 2207 03/12/18 0658  BP: (!) 145/73 (!) 149/62 (!) 135/58 135/69  Pulse: 69 74 86 72  Resp: 18 16 18 18   Temp: 97.8 F (36.6 C) 98 F (36.7 C) 99 F (37.2 C) 98.2 F (36.8 C)  TempSrc: Oral Oral Oral Oral   SpO2: 100% 97% 97% 100%    Wt Readings from Last 3 Encounters:  03/02/18 86.6 kg  03/02/18 86.6 kg  02/28/18 87 kg     Intake/Output Summary (Last 24 hours) at 03/12/2018 1421 Last data filed at 03/12/2018 1045 Gross per 24 hour  Intake 556 ml  Output 1500 ml  Net -944 ml     Physical Exam  Awake Alert, Oriented X 3, No new F.N deficits, Normal affect Symmetrical Chest wall movement, Good air movement bilaterally, CTAB RRR,No Gallops,Rubs or new Murmurs, No Parasternal Heave +ve B.Sounds, Abd Soft, No tenderness, No rebound - guarding or rigidity. No Cyanosis, Clubbing or edema, No new Rash or bruise        Data Review:    CBC Recent Labs  Lab 03/09/18 1450 03/10/18 0356 03/11/18 0511 03/12/18 0418  WBC 19.1* 19.3* 22.3* 19.8*  HGB 9.3* 8.7* 9.5* 8.6*  HCT 32.0* 30.0* 33.5* 29.9*  PLT 148* 153 189 154  MCV 87.2 86.0 87.0 86.2  MCH 25.3* 24.9* 24.7* 24.8*  MCHC 29.1* 29.0* 28.4* 28.8*  RDW 17.6* 17.5* 17.9* 17.6*  LYMPHSABS 3.2 2.1  --   --   MONOABS 1.9* 4.3*  --   --   EOSABS 0.2 0.4  --   --   BASOSABS 1.5* 0.7*  --   --     Chemistries  Recent Labs  Lab 03/09/18 1450 03/10/18 0356 03/11/18 0511 03/12/18 0418  NA 139 138 141 138  K 3.9 3.8 3.9 4.2  CL 109 110 108 109  CO2 23 22 22 22   GLUCOSE 111* 124* 120* 107*  BUN 51* 49* 42* 45*  CREATININE 1.39* 1.47* 1.42* 1.60*  CALCIUM 9.7 9.7 10.3 9.7  AST 31 29  --   --   ALT 24 23  --   --  ALKPHOS 104 93  --   --   BILITOT 0.3 0.5  --   --    ------------------------------------------------------------------------------------------------------------------ No results for input(s): CHOL, HDL, LDLCALC, TRIG, CHOLHDL, LDLDIRECT in the last 72 hours.  Lab Results  Component Value Date   HGBA1C 5.1 08/18/2017   ------------------------------------------------------------------------------------------------------------------ No results for input(s): TSH, T4TOTAL, T3FREE, THYROIDAB in the last  72 hours.  Invalid input(s): FREET3 ------------------------------------------------------------------------------------------------------------------ Recent Labs    03/11/18 0511  VITAMINB12 1,325*    Coagulation profile No results for input(s): INR, PROTIME in the last 168 hours.  No results for input(s): DDIMER in the last 72 hours.  Cardiac Enzymes No results for input(s): CKMB, TROPONINI, MYOGLOBIN in the last 168 hours.  Invalid input(s): CK ------------------------------------------------------------------------------------------------------------------    Component Value Date/Time   BNP 193.7 (H) 03/09/2018 1450    Inpatient Medications  Scheduled Meds: . amLODipine  5 mg Oral Daily  . aspirin EC  325 mg Oral Daily  . B-complex with vitamin C  1 tablet Oral Daily  . clotrimazole  1 application Topical BID  . enoxaparin (LOVENOX) injection  40 mg Subcutaneous Q24H  . ferrous gluconate  324 mg Oral Daily  . fluticasone  1 spray Each Nare Daily  . folic acid  1 mg Oral Daily  . furosemide  40 mg Oral QPC supper  . hydrALAZINE  75 mg Oral TID  . polyethylene glycol  17 g Oral BID   Continuous Infusions: . sodium chloride 75 mL/hr at 03/12/18 1132  . ceFEPime (MAXIPIME) IV 1 g (03/12/18 1138)   PRN Meds:.acetaminophen **OR** acetaminophen, benzonatate, ondansetron **OR** ondansetron (ZOFRAN) IV  Micro Results Recent Results (from the past 240 hour(s))  Urine culture     Status: Abnormal   Collection Time: 03/09/18  2:32 PM  Result Value Ref Range Status   Specimen Description URINE, RANDOM  Final   Special Requests   Final    NONE Performed at [facility], [place] [address], [place], [place] [address]    Culture 70,000 COLONIES/mL PSEUDOMONAS AERUGINOSA (A)  Final   Report Status 03/11/2018 FINAL  Final   Organism ID, Bacteria PSEUDOMONAS AERUGINOSA (A)  Final      Susceptibility   Pseudomonas aeruginosa - MIC*    CEFTAZIDIME 4 SENSITIVE Sensitive      CIPROFLOXACIN >=4 RESISTANT Resistant     GENTAMICIN 4 SENSITIVE Sensitive     IMIPENEM 2 SENSITIVE Sensitive     PIP/TAZO 32 SENSITIVE Sensitive     CEFEPIME 8 SENSITIVE Sensitive     * 70,000 COLONIES/mL PSEUDOMONAS AERUGINOSA  Blood culture (routine x 2)     Status: None (Preliminary result)   Collection Time: 03/09/18  2:50 PM  Result Value Ref Range Status   Specimen Description BLOOD RIGHT ANTECUBITAL  Final   Special Requests   Final    BOTTLES DRAWN AEROBIC AND ANAEROBIC Blood Culture adequate volume   Culture   Final    NO GROWTH 3 DAYS Performed at [facility], [address], [place], [place] [address]    Report Status PENDING  Incomplete  Blood culture (routine x 2)     Status: None (Preliminary result)   Collection Time: 03/09/18  4:04 PM  Result Value Ref Range Status   Specimen Description BLOOD LEFT HAND  Final   Special Requests   Final    BOTTLES DRAWN AEROBIC AND ANAEROBIC Blood Culture adequate volume   Culture   Final    NO GROWTH  3 DAYS Performed at [facility], [place] [address], [place], [place] [address]    Report Status PENDING  Incomplete  MRSA PCR Screening     Status: None   Collection Time: 03/10/18  3:48 AM  Result Value Ref Range Status   MRSA by PCR NEGATIVE NEGATIVE Final    Comment:        The GeneXpert MRSA Assay (FDA approved for NASAL specimens only), is one component of a comprehensive MRSA colonization surveillance program. It is not intended to diagnose MRSA infection nor to guide or monitor treatment for MRSA infections. Performed at [facility], [place] [address], [place], [place] [address]     Radiology Reports Dg Chest 2 View  Result Date: 03/09/2018 CLINICAL DATA:  Shortness of breath, chest pain, hypertension, diabetes EXAM: CHEST - 2 VIEW COMPARISON:  09/05/2017 FINDINGS: Stable cardiomegaly with mild perihilar and basilar interstitial prominence suspicious for chronic edema versus interstitial lung  disease. Chronic elevation of the right hemidiaphragm. Improvement in the left basilar atelectasis. No large effusion or pneumothorax. Trachea is midline. Aorta atherosclerotic. Diffuse sclerosis of the visualized chest and upper extremities compatible with history of myelofibrosis. IMPRESSION: Stable cardiomegaly with mild chronic interstitial edema versus interstitial lung disease. Chronic right hemidiaphragm elevation Improving left basilar atelectasis Chronic osseous sclerosis compatible with myelofibrosis Atherosclerosis Electronically Signed   By: Jerilynn Mages.  Shick M.D.   On: 03/09/2018 15:29     Phillips Climes M.D on 03/12/2018 at 2:21 PM  Between 7am to 7pm - Pager - [phone]  After 7pm go to [email or web address] - password [email or web address]  [facility] -  Office  [phone]

## 2018-03-12 NOTE — Progress Notes (Signed)
Pharmacy Antibiotic Note  Juan [name] is a 77 y.o. male admitted on 03/09/2018 with pseudomonal UTI.  Pharmacy has been consulted for Cefepime dosing. Was placed on Levaquin 10/16 at o/p MD office. Urine cx (10/16) from MD office growing pseudomonas (R to Cipro).   Renal function decreased SCr to 1.6, CrCl ~ 42mL/min  Plan: Change cefepime to 1gm IV q24h  F/u LOT (5-7d), renal function, clinical progression    Temp (24hrs), Avg:98.6 F (37 C), Min:98.2 F (36.8 C), Max:99 F (37.2 C)  Recent Labs  Lab 03/09/18 1450 03/09/18 1507 03/09/18 1612 03/10/18 0356 03/11/18 0511 03/12/18 0418  WBC 19.1*  --   --  19.3* 22.3* 19.8*  CREATININE 1.39*  --   --  1.47* 1.42* 1.60*  LATICACIDVEN  --  0.88 1.39  --   --   --     Estimated Creatinine Clearance: 46.9 mL/min (A) (by C-G formula based on SCr of 1.6 mg/dL (H)).    No Known Allergies  Antimicrobials this admission: 10/23 Cefepime >>   Microbiology results: 10/16 (o/p cx) UCx: Pseudomonas (R Cipro)   Bertis Ruddy, PharmD Clinical Pharmacist Please check AMION for all [place] numbers 03/12/2018 2:56 PM

## 2018-03-12 NOTE — Evaluation (Signed)
Physical Therapy Evaluation Patient Details Name: Juan [name] MRN: [id number] DOB: April 01, 1941 Today's Date: 03/12/2018   History of Present Illness  Pt is a 77 y/o male admitted secondary to UTI. PMH including but not limited to CHF, CKD, CAD, AAA, DM, HTN and chronic respiratory failure (on home O2).    Clinical Impression  Pt presented supine in bed with HOB elevated, awake and willing to participate in therapy session. Prior to admission, pt reported that he was ambulating with RW and independent with ADLs. Pt lives with his son in an apartment with a level entry. Evaluation very limited secondary to pt refusing to participate in any OOB mobility until after his lunch. Pt required total A x2 for repositioning in bed to sit up for lunch which arrived at end of session. Pt on 2L of O2 throughout with SPO2 in low 90's. Pt would continue to benefit from skilled physical therapy services at this time while admitted and after d/c to address the below listed limitations in order to improve overall safety and independence with functional mobility.      Follow Up Recommendations SNF    Equipment Recommendations  None recommended by PT    Recommendations for Other Services       Precautions / Restrictions Precautions Precautions: Fall Restrictions Weight Bearing Restrictions: No      Mobility  Bed Mobility Overal bed mobility: Needs Assistance             General bed mobility comments: total A x2 to readjust/reposition in bed; pt refusing any further mobility at this time until he has had his lunch; pt's lunch arriving at end of this session  Transfers                    Ambulation/Gait                Stairs            Wheelchair Mobility    Modified Rankin (Stroke Patients Only)       Balance                                             Pertinent Vitals/Pain Pain Assessment: No/denies pain    Home Living Family/patient  expects to be discharged to:: Private residence Living Arrangements: Children Available Help at Discharge: Family Type of Home: Apartment Home Access: Level entry     Home Layout: One level Home Equipment: Environmental consultant - 2 wheels;Cane - single point;Bedside commode;Wheelchair - manual;Hospital bed      Prior Function Level of Independence: Independent with assistive device(s)         Comments: ambulates with RW, reported he is independent with ADLs     Hand Dominance        Extremity/Trunk Assessment   Upper Extremity Assessment Upper Extremity Assessment: Generalized weakness    Lower Extremity Assessment Lower Extremity Assessment: Generalized weakness       Communication   Communication: No difficulties  Cognition Arousal/Alertness: Awake/alert Behavior During Therapy: Flat affect Overall Cognitive Status: Impaired/Different from baseline Area of Impairment: Safety/judgement                         Safety/Judgement: Decreased awareness of deficits;Decreased awareness of safety     General Comments: pt is very particular about everything  General Comments      Exercises     Assessment/Plan    PT Assessment Patient needs continued PT services  PT Problem List Decreased strength;Decreased activity tolerance;Decreased balance;Decreased mobility;Decreased coordination;Decreased safety awareness       PT Treatment Interventions DME instruction;Gait training;Stair training;Functional mobility training;Therapeutic exercise;Therapeutic activities;Balance training;Neuromuscular re-education;Patient/family education    PT Goals (Current goals can be found in the Care Plan section)  Acute Rehab PT Goals Patient Stated Goal: to eat lunch PT Goal Formulation: With patient Time For Goal Achievement: 03/26/18 Potential to Achieve Goals: Fair    Frequency Min 3X/week   Barriers to discharge        Co-evaluation               AM-PAC PT "6  Clicks" Daily Activity  Outcome Measure Difficulty turning over in bed (including adjusting bedclothes, sheets and blankets)?: Unable Difficulty moving from lying on back to sitting on the side of the bed? : Unable Difficulty sitting down on and standing up from a chair with arms (e.g., wheelchair, bedside commode, etc,.)?: Unable Help needed moving to and from a bed to chair (including a wheelchair)?: A Lot Help needed walking in hospital room?: A Lot Help needed climbing 3-5 steps with a railing? : Total 6 Click Score: 8    End of Session Equipment Utilized During Treatment: Oxygen(2L of O2) Activity Tolerance: Patient limited by fatigue Patient left: in bed;with call Paez/phone within reach;with bed alarm set Nurse Communication: Mobility status PT Visit Diagnosis: Other abnormalities of gait and mobility (R26.89);Muscle weakness (generalized) (M62.81)    Time: 4451-4604 PT Time Calculation (min) (ACUTE ONLY): 13 min   Charges:   PT Evaluation $PT Eval Moderate Complexity: 1 Mod          Sherie Don, PT, DPT  Acute Rehabilitation Services Pager [phone] Office [place] 03/12/2018, 3:00 PM

## 2018-03-12 NOTE — Progress Notes (Signed)
PT Progress Note  Clinical Impression: Pt making fair progress. Requested assistance with his "walk" since he has now had lunch. Pt also refusing to wear O2 for ambulation. Pt on RA with SPO2 maintaining in low 90's. Pt ambulated ~75' with RW and min guard. He remains limited secondary to fatigue and generalized weakness. Pt would continue to benefit from skilled physical therapy services at this time while admitted and after d/c to address the below listed limitations in order to improve overall safety and independence with functional mobility.    03/12/18 1500  PT Visit Information  Last PT Received On 03/12/18  Assistance Needed +1  History of Present Illness Pt is a 77 y/o male admitted secondary to UTI. PMH including but not limited to CHF, CKD, CAD, AAA, DM, HTN and chronic respiratory failure (on home O2).  Precautions  Precautions Fall  Restrictions  Weight Bearing Restrictions No  Pain Assessment  Pain Assessment No/denies pain  Cognition  Arousal/Alertness Awake/alert  Behavior During Therapy Flat affect  Overall Cognitive Status Impaired/Different from baseline  Area of Impairment Safety/judgement  Safety/Judgement Decreased awareness of deficits;Decreased awareness of safety  General Comments pt is very particular about everything  Bed Mobility  Overal bed mobility Needs Assistance  Bed Mobility Supine to Sit;Sit to Supine  Supine to sit Min assist  Sit to supine Mod assist  General bed mobility comments increased time and effort, min A to elevate trunk and mod A for bilateral LEs return into bed  Transfers  Overall transfer level Needs assistance  Equipment used Rolling walker (2 wheeled)  Transfers Sit to/from Stand  Sit to Stand Min guard;From elevated surface  General transfer comment bed in excessively elevated position per pt request/demand, min guard for safety  Ambulation/Gait  Ambulation/Gait assistance Min guard  Gait Distance (Feet) 75 Feet  Assistive  device Rolling walker (2 wheeled)  Gait Pattern/deviations Step-through pattern;Decreased step length - right;Decreased step length - left;Decreased stride length;Shuffle  General Gait Details pt with mild instability but no overt LOB or need for physical assistance, min guard for safety  Gait velocity decreased  Balance  Overall balance assessment Needs assistance  Sitting-balance support Feet supported  Sitting balance-Leahy Scale Fair  Standing balance support Bilateral upper extremity supported  Standing balance-Leahy Scale Poor  PT - End of Session  Equipment Utilized During Treatment Gait belt  Activity Tolerance Patient limited by fatigue  Patient left in bed;with call [place]/phone within reach;with bed alarm set  Nurse Communication Mobility status   PT - Assessment/Plan  PT Plan Current plan remains appropriate  PT Visit Diagnosis Other abnormalities of gait and mobility (R26.89);Muscle weakness (generalized) (M62.81)  PT Frequency (ACUTE ONLY) Min 3X/week  Follow Up Recommendations SNF;Other (comment) (pt will likely refuse)  PT equipment None recommended by PT  AM-PAC PT "6 Clicks" Daily Activity Outcome Measure  Difficulty turning over in bed (including adjusting bedclothes, sheets and blankets)? 2  Difficulty moving from lying on back to sitting on the side of the bed?  1  Difficulty sitting down on and standing up from a chair with arms (e.g., wheelchair, bedside commode, etc,.)? 1  Help needed moving to and from a bed to chair (including a wheelchair)? 3  Help needed walking in hospital room? 3  Help needed climbing 3-5 steps with a railing?  1  6 Click Score 11  Mobility G Code  CL  PT Goal Progression  Progress towards PT goals Progressing toward goals  Acute Rehab PT Goals  PT Goal Formulation With patient  Time For Goal Achievement 03/26/18  Potential to Achieve Goals Fair  PT Time Calculation  PT Start Time (ACUTE ONLY) 1424  PT Stop Time (ACUTE ONLY) 1443   PT Time Calculation (min) (ACUTE ONLY) 19 min  PT General Charges  $$ ACUTE PT VISIT 1 Visit  PT Treatments  $Gait Training 8-22 mins   Sherie Don, [place], DPT  Acute Rehabilitation Services Pager [phone] Office [phone]

## 2018-03-13 DIAGNOSIS — N179 Acute kidney failure, unspecified: Secondary | ICD-10-CM

## 2018-03-13 LAB — CBC
HCT: 28.8 % — ABNORMAL LOW (ref 39.0–52.0)
Hemoglobin: 8.2 g/dL — ABNORMAL LOW (ref 13.0–17.0)
MCH: 24.7 pg — ABNORMAL LOW (ref 26.0–34.0)
MCHC: 28.5 g/dL — AB (ref 30.0–36.0)
MCV: 86.7 fL (ref 80.0–100.0)
NRBC: 1.6 % — AB (ref 0.0–0.2)
Platelets: 152 10*3/uL (ref 150–400)
RBC: 3.32 MIL/uL — ABNORMAL LOW (ref 4.22–5.81)
RDW: 17.5 % — AB (ref 11.5–15.5)
WBC: 18.7 10*3/uL — ABNORMAL HIGH (ref 4.0–10.5)

## 2018-03-13 LAB — BASIC METABOLIC PANEL
Anion gap: 9 (ref 5–15)
BUN: 45 mg/dL — AB (ref 8–23)
CALCIUM: 9.9 mg/dL (ref 8.9–10.3)
CO2: 22 mmol/L (ref 22–32)
CREATININE: 1.6 mg/dL — AB (ref 0.61–1.24)
Chloride: 106 mmol/L (ref 98–111)
GFR calc non Af Amer: 40 mL/min — ABNORMAL LOW (ref >60)
GFR, EST AFRICAN AMERICAN: 47 mL/min — AB (ref >60)
Glucose, Bld: 99 mg/dL (ref 70–99)
Potassium: 4.4 mmol/L (ref 3.5–5.1)
SODIUM: 137 mmol/L (ref 135–145)

## 2018-03-13 MED ORDER — POLYETHYLENE GLYCOL 3350 17 G PO PACK
17.0000 g | PACK | Freq: Once | ORAL | Status: AC
  Administered 2018-03-13: 17 g via ORAL
  Filled 2018-03-13: qty 1

## 2018-03-13 NOTE — Progress Notes (Signed)
PROGRESS NOTE                                                                                                                                                                                                             Patient Demographics:    Juan [name], is a 77 y.o. male, DOB - 03/13/1941, KTG:[id number]  Admit date - 03/09/2018   Admitting Physician Reubin Milan, MD  Outpatient Primary MD for the patient is Colon Branch, MD  LOS - 4   Chief Complaint  Patient presents with  . Dysuria       Brief Narrative    77 y.o. male with medical history significant of allergic rhinitis, anemia, ascending aortic aneurysm, CAD, chronic diastolic CHF, chronic respiratory failure on home oxygen, stage III chronic kidney disease, chronic lower extremities edema, hemorrhoids, history of hydronephrosis, history of splenic infarct, history of thrombocytosis, hypertension, iron deficiency, migraine headaches, moderate to severe pulmonary hypertension, myeloproliferative disease, sleep apnea not on CPAP, type 2 diabetes, rhistory of pyelonephritis, ecurrent Pseudomonas UTI who was referred by his PCP for treatment of Pseudomonas UTI after urine culture revealed resistance to Levaquin and sensitivity only to IV antibiotic.   Subjective:    Juan [name] today has, No headache, No chest pain, No abdominal pain , still reports no bowel movement despite laxatives   Assessment  & Plan :    Principal Problem:   Pseudomonas urinary tract infection Active Problems:   Coronary atherosclerosis   BPH (benign prostatic hyperplasia)   CKD (chronic kidney disease) stage 3, GFR 30-59 ml/min (HCC)   Iron deficiency anemia due to chronic blood loss  Pseudomonas urinary tract infection -He reports previous UTI infections, by reviewing previous urine cultures, apparently all of them growing Pseudomonas resistant to oral regimen, does report dysuria, polyuria, chills, he does have  leukocytosis as well. -Patient with multiple UTIs in the past which has been treated with oral regimen, all previous urine cultures growing Pseudomonas sensitive only to IV regimen, I have discussed with ID, recommendation is to treat for 5 to 7 days on IV regimen, I will continue with IV cefepime .  Have discussed with the patient, he does not want PICC line, so he will have to in the hospital for IV antibiotic administration. -Patient still with significant leukocytosis, but he is nontoxic-appearing, at this  point unclear if this is related to lymphoproliferative disorder or infection, I will obtain CBC with differentials in the morning.  Coronary atherosclerosis -Continue aspirin and amlodipine.  Denies any chest pain or shortness of breath  BPH (benign prostatic hyperplasia) Continue daily Flomax.  AKI on  CKD (chronic kidney disease) stage 3, GFR 30-59 ml/min (HCC) -Avoid IV fluids and holding diuresis, eating remains at 1.6 today, I will continue with IV fluids today as no signs of volume overload, and repeat BMP in a.m. .  Anemia -He is being followed at [place] [facility] for his anemia, receiving Procrit as needed, he is with known history of a chronic myeloproliferative disorder.     Code Status : full  Family Communication  : None at bedside  Disposition Plan  : Home when stable   Consults  :  none  Procedures  : none  DVT Prophylaxis  :  lovenox  Lab Results  Component Value Date   PLT 152 03/13/2018    Antibiotics  :   Anti-infectives (From admission, onward)   Start     Dose/Rate Route Frequency Ordered Stop   03/13/18 1138  ceFEPIme (MAXIPIME) 1 g in sodium chloride 0.9 % 100 mL IVPB     1 g 200 mL/hr over 30 Minutes Intravenous Every 24 hours 03/12/18 1505     03/09/18 1830  levofloxacin (LEVAQUIN) tablet 500 mg  Status:  Discontinued     500 mg Oral Daily 03/09/18 1820 03/09/18 1837   03/09/18 1630  ceFEPIme (MAXIPIME) 1 g in sodium chloride  0.9 % 100 mL IVPB  Status:  Discontinued     1 g 200 mL/hr over 30 Minutes Intravenous Every 12 hours 03/09/18 1548 03/12/18 1505        Objective:   Vitals:   03/11/18 2207 03/12/18 0658 03/12/18 2148 03/13/18 0505  BP: (!) 135/58 135/69 (!) 139/58 111/62  Pulse: 86 72 74 73  Resp: 18 18 18 18   Temp: 99 F (37.2 C) 98.2 F (36.8 C) 99.2 F (37.3 C) 99 F (37.2 C)  TempSrc: Oral Oral Oral Oral  SpO2: 97% 100% 96% 100%    Wt Readings from Last 3 Encounters:  03/02/18 86.6 kg  03/02/18 86.6 kg  02/28/18 87 kg     Intake/Output Summary (Last 24 hours) at 03/13/2018 1327 Last data filed at 03/13/2018 1000 Gross per 24 hour  Intake 730 ml  Output -  Net 730 ml     Physical Exam  Awake Alert, Oriented X 3, No new F.N deficits, Normal affect Symmetrical Chest wall movement, Good air movement bilaterally, CTAB RRR,No Gallops,Rubs or new Murmurs, No Parasternal Heave +ve B.Sounds, Abd Soft, No tenderness, No rebound - guarding or rigidity. No Cyanosis, Clubbing or edema, No new Rash or bruise         Data Review:    CBC Recent Labs  Lab 03/09/18 1450 03/10/18 0356 03/11/18 0511 03/12/18 0418 03/13/18 0454  WBC 19.1* 19.3* 22.3* 19.8* 18.7*  HGB 9.3* 8.7* 9.5* 8.6* 8.2*  HCT 32.0* 30.0* 33.5* 29.9* 28.8*  PLT 148* 153 189 154 152  MCV 87.2 86.0 87.0 86.2 86.7  MCH 25.3* 24.9* 24.7* 24.8* 24.7*  MCHC 29.1* 29.0* 28.4* 28.8* 28.5*  RDW 17.6* 17.5* 17.9* 17.6* 17.5*  LYMPHSABS 3.2 2.1  --   --   --   MONOABS 1.9* 4.3*  --   --   --   EOSABS 0.2 0.4  --   --   --  BASOSABS 1.5* 0.7*  --   --   --     Chemistries  Recent Labs  Lab 03/09/18 1450 03/10/18 0356 03/11/18 0511 03/12/18 0418 03/13/18 0454  NA 139 138 141 138 137  K 3.9 3.8 3.9 4.2 4.4  CL 109 110 108 109 106  CO2 23 22 22 22 22   GLUCOSE 111* 124* 120* 107* 99  BUN 51* 49* 42* 45* 45*  CREATININE 1.39* 1.47* 1.42* 1.60* 1.60*  CALCIUM 9.7 9.7 10.3 9.7 9.9  AST 31 29  --   --   --    ALT 24 23  --   --   --   ALKPHOS 104 93  --   --   --   BILITOT 0.3 0.5  --   --   --    ------------------------------------------------------------------------------------------------------------------ No results for input(s): CHOL, HDL, LDLCALC, TRIG, CHOLHDL, LDLDIRECT in the last 72 hours.  Lab Results  Component Value Date   HGBA1C 5.1 08/18/2017   ------------------------------------------------------------------------------------------------------------------ No results for input(s): TSH, T4TOTAL, T3FREE, THYROIDAB in the last 72 hours.  Invalid input(s): FREET3 ------------------------------------------------------------------------------------------------------------------ Recent Labs    03/11/18 0511  VITAMINB12 1,325*    Coagulation profile No results for input(s): INR, PROTIME in the last 168 hours.  No results for input(s): DDIMER in the last 72 hours.  Cardiac Enzymes No results for input(s): CKMB, TROPONINI, MYOGLOBIN in the last 168 hours.  Invalid input(s): CK ------------------------------------------------------------------------------------------------------------------    Component Value Date/Time   BNP 193.7 (H) 03/09/2018 1450    Inpatient Medications  Scheduled Meds: . amLODipine  5 mg Oral Daily  . aspirin EC  325 mg Oral Daily  . B-complex with vitamin C  1 tablet Oral Daily  . clotrimazole  1 application Topical BID  . enoxaparin (LOVENOX) injection  40 mg Subcutaneous Q24H  . ferrous gluconate  324 mg Oral Daily  . fluticasone  1 spray Each Nare Daily  . folic acid  1 mg Oral Daily  . furosemide  40 mg Oral QPC supper  . hydrALAZINE  75 mg Oral TID  . polyethylene glycol  17 g Oral BID  . polyethylene glycol  17 g Oral Once   Continuous Infusions: . sodium chloride 75 mL/hr at 03/13/18 0507  . ceFEPime (MAXIPIME) IV     PRN Meds:.acetaminophen **OR** acetaminophen, benzonatate, ondansetron **OR** ondansetron (ZOFRAN) IV  Micro  Results Recent Results (from the past 240 hour(s))  Urine culture     Status: Abnormal   Collection Time: 03/09/18  2:32 PM  Result Value Ref Range Status   Specimen Description URINE, RANDOM  Final   Special Requests   Final    NONE Performed at [facility], [address], [place], [place] [address]    Culture 70,000 COLONIES/mL PSEUDOMONAS AERUGINOSA (A)  Final   Report Status 03/11/2018 FINAL  Final   Organism ID, Bacteria PSEUDOMONAS AERUGINOSA (A)  Final      Susceptibility   Pseudomonas aeruginosa - MIC*    CEFTAZIDIME 4 SENSITIVE Sensitive     CIPROFLOXACIN >=4 RESISTANT Resistant     GENTAMICIN 4 SENSITIVE Sensitive     IMIPENEM 2 SENSITIVE Sensitive     PIP/TAZO 32 SENSITIVE Sensitive     CEFEPIME 8 SENSITIVE Sensitive     * 70,000 COLONIES/mL PSEUDOMONAS AERUGINOSA  Blood culture (routine x 2)     Status: None (Preliminary result)   Collection Time: 03/09/18  2:50 PM  Result Value Ref Range Status  Specimen Description BLOOD RIGHT ANTECUBITAL  Final   Special Requests   Final    BOTTLES DRAWN AEROBIC AND ANAEROBIC Blood Culture adequate volume   Culture   Final    NO GROWTH 3 DAYS Performed at [facility], [address], [place], [place] [address]    Report Status PENDING  Incomplete  Blood culture (routine x 2)     Status: None (Preliminary result)   Collection Time: 03/09/18  4:04 PM  Result Value Ref Range Status   Specimen Description BLOOD LEFT HAND  Final   Special Requests   Final    BOTTLES DRAWN AEROBIC AND ANAEROBIC Blood Culture adequate volume   Culture   Final    NO GROWTH 3 DAYS Performed at [facility], [place] [address], [place], [place] [address]    Report Status PENDING  Incomplete  MRSA PCR Screening     Status: None   Collection Time: 03/10/18  3:48 AM  Result Value Ref Range Status   MRSA by PCR NEGATIVE NEGATIVE Final    Comment:        The GeneXpert MRSA Assay (FDA approved for NASAL specimens only), is one  component of a comprehensive MRSA colonization surveillance program. It is not intended to diagnose MRSA infection nor to guide or monitor treatment for MRSA infections. Performed at [facility], [place] [address], [place], [place] [address]     Radiology Reports Dg Chest 2 View  Result Date: 03/09/2018 CLINICAL DATA:  Shortness of breath, chest pain, hypertension, diabetes EXAM: CHEST - 2 VIEW COMPARISON:  09/05/2017 FINDINGS: Stable cardiomegaly with mild perihilar and basilar interstitial prominence suspicious for chronic edema versus interstitial lung disease. Chronic elevation of the right hemidiaphragm. Improvement in the left basilar atelectasis. No large effusion or pneumothorax. Trachea is midline. Aorta atherosclerotic. Diffuse sclerosis of the visualized chest and upper extremities compatible with history of myelofibrosis. IMPRESSION: Stable cardiomegaly with mild chronic interstitial edema versus interstitial lung disease. Chronic right hemidiaphragm elevation Improving left basilar atelectasis Chronic osseous sclerosis compatible with myelofibrosis Atherosclerosis Electronically Signed   By: Jerilynn Mages.  Shick M.D.   On: 03/09/2018 15:29     Phillips Climes M.D on 03/13/2018 at 1:27 PM  Between 7am to 7pm - Pager - [phone]  After 7pm go to [email or web address] - password [facility]  [facility] -  Office  [phone]

## 2018-03-14 ENCOUNTER — Other Ambulatory Visit: Payer: Self-pay | Admitting: Hematology

## 2018-03-14 DIAGNOSIS — D7581 Myelofibrosis: Secondary | ICD-10-CM

## 2018-03-14 DIAGNOSIS — E875 Hyperkalemia: Secondary | ICD-10-CM

## 2018-03-14 LAB — CBC WITH DIFFERENTIAL/PLATELET
BAND NEUTROPHILS: 3 %
BASOS ABS: 1.7 10*3/uL — AB (ref 0.0–0.1)
BASOS PCT: 10 %
Blasts: 5 %
Eosinophils Absolute: 0.2 10*3/uL (ref 0.0–0.5)
Eosinophils Relative: 1 %
HEMATOCRIT: 29.2 % — AB (ref 39.0–52.0)
HEMOGLOBIN: 8.3 g/dL — AB (ref 13.0–17.0)
Lymphocytes Relative: 10 %
Lymphs Abs: 1.7 10*3/uL (ref 0.7–4.0)
MCH: 24.6 pg — AB (ref 26.0–34.0)
MCHC: 28.4 g/dL — ABNORMAL LOW (ref 30.0–36.0)
MCV: 86.4 fL (ref 80.0–100.0)
MONOS PCT: 7 %
Metamyelocytes Relative: 8 %
Monocytes Absolute: 1.2 10*3/uL — ABNORMAL HIGH (ref 0.1–1.0)
Myelocytes: 5 %
NEUTROS ABS: 11.4 10*3/uL — AB (ref 1.7–7.7)
NEUTROS PCT: 49 %
NRBC: 1.2 % — AB (ref 0.0–0.2)
OTHER: 1 %
Platelets: 163 10*3/uL (ref 150–400)
Promyelocytes Relative: 1 %
RBC: 3.38 MIL/uL — AB (ref 4.22–5.81)
RDW: 17.6 % — AB (ref 11.5–15.5)
WBC: 17.2 10*3/uL — ABNORMAL HIGH (ref 4.0–10.5)
nRBC: 1 /100 WBC — ABNORMAL HIGH

## 2018-03-14 LAB — BASIC METABOLIC PANEL
Anion gap: 8 (ref 5–15)
BUN: 52 mg/dL — AB (ref 8–23)
CHLORIDE: 110 mmol/L (ref 98–111)
CO2: 19 mmol/L — ABNORMAL LOW (ref 22–32)
Calcium: 9.5 mg/dL (ref 8.9–10.3)
Creatinine, Ser: 1.53 mg/dL — ABNORMAL HIGH (ref 0.61–1.24)
GFR calc Af Amer: 49 mL/min — ABNORMAL LOW (ref >60)
GFR calc non Af Amer: 42 mL/min — ABNORMAL LOW (ref >60)
Glucose, Bld: 86 mg/dL (ref 70–99)
POTASSIUM: 5.5 mmol/L — AB (ref 3.5–5.1)
Sodium: 137 mmol/L (ref 135–145)

## 2018-03-14 LAB — CULTURE, BLOOD (ROUTINE X 2)
CULTURE: NO GROWTH
Culture: NO GROWTH
SPECIAL REQUESTS: ADEQUATE
Special Requests: ADEQUATE

## 2018-03-14 MED ORDER — SODIUM POLYSTYRENE SULFONATE 15 GM/60ML PO SUSP
45.0000 g | Freq: Once | ORAL | Status: AC
  Administered 2018-03-14: 45 g via ORAL
  Filled 2018-03-14: qty 180

## 2018-03-14 MED ORDER — BISACODYL 10 MG RE SUPP: 10.0000 mg | Freq: Once | RECTAL | Status: DC

## 2018-03-14 MED ORDER — POLYETHYLENE GLYCOL 3350 17 G PO PACK: 34.0000 g | PACK | Freq: Once | ORAL | Status: DC

## 2018-03-14 NOTE — Progress Notes (Signed)
CSW received consult regarding PT recommendation of SNF at discharge.  Patient is refusing SNF. He states that he would like to return home at discharge. CSW explained that his SNF days have started over and he should be able to receive up to 20 days at [facility]. Patient continued to refuse, stating that he has plenty of support at home. He does request [facility] for transport. He then reported that he needs to arrange food for when he gets home. CSW encouraged patient to speak with his son (lives with him) and daughter to have them help him with food. CSW also told him that SNF would provide all his meals, but he continued to refuse. RNCM aware.   CSW signing off.   Percell Locus Patrycja Mumpower LCSW [phone]

## 2018-03-14 NOTE — Progress Notes (Signed)
PROGRESS NOTE                                                                                                                                                                                                             Patient Demographics:    Juan [name], is a 77 y.o. male, DOB - 1940/06/17, ZGY:[id number]  Admit date - 03/09/2018   Admitting Physician Reubin Milan, MD  Outpatient Primary MD for the patient is Colon Branch, MD  LOS - 5   Chief Complaint  Patient presents with  . Dysuria       Brief Narrative    77 y.o. male with medical history significant of allergic rhinitis, anemia, ascending aortic aneurysm, CAD, chronic diastolic CHF, chronic respiratory failure on home oxygen, stage III chronic kidney disease, chronic lower extremities edema, hemorrhoids, history of hydronephrosis, history of splenic infarct, history of thrombocytosis, hypertension, iron deficiency, migraine headaches, moderate to severe pulmonary hypertension, myeloproliferative disease, sleep apnea not on CPAP, type 2 diabetes, rhistory of pyelonephritis, ecurrent Pseudomonas UTI who was referred by his PCP for treatment of Pseudomonas UTI after urine culture revealed resistance to Levaquin and sensitivity only to IV antibiotic.   Subjective:    Juan [name] today has, No headache, No chest pain, No abdominal pain , still reports no bowel movement despite laxatives   Assessment  & Plan :    Principal Problem:   Pseudomonas urinary tract infection Active Problems:   Coronary atherosclerosis   BPH (benign prostatic hyperplasia)   CKD (chronic kidney disease) stage 3, GFR 30-59 ml/min (HCC)   Iron deficiency anemia due to chronic blood loss  Pseudomonas urinary tract infection -He reports previous UTI infections, by reviewing previous urine cultures, apparently all of them growing Pseudomonas resistant to oral regimen, does report dysuria, polyuria, chills, he does have  leukocytosis as well. -Patient with multiple UTIs in the past which has been treated with oral regimen, all previous urine cultures growing Pseudomonas sensitive only to IV regimen, I have discussed with ID, recommendation is to treat for 5 to 7 days on IV regimen, I will continue with IV cefepime .  Have discussed with the patient, he does not want PICC line, so he will have to in the hospital for IV antibiotic administration. -Patient still with significant leukocytosis, but he is nontoxic-appearing, at this  point unclear if this is related to lymphoproliferative disorder or infection.  Coronary atherosclerosis -Continue aspirin and amlodipine.  Denies any chest pain or shortness of breath  BPH (benign prostatic hyperplasia) Continue daily Flomax.  AKI on  CKD (chronic kidney disease) stage 3, GFR 30-59 ml/min (HCC) -Avoid IV fluids and holding diuresis, creatinine is 1.5 today, I will DC his IV fluids, but I will continue to hold Lasix as well, repeat BMP in a.m.Marland Kitchen  Hyperkalemia -Give 1 dose of Kayexalate, repeat BMP in a.m., patient with significant constipation with no bowel movement for last few days despite being on laxatives, will order suppository for today..  Anemia -He is being followed at [facility] for his anemia, receiving Procrit as needed, he is with known history of a chronic myeloproliferative disorder.     Code Status : full  Family Communication  : None at bedside  Disposition Plan  : Home when stable   Consults  :  none  Procedures  : none  DVT Prophylaxis  :  lovenox  Lab Results  Component Value Date   PLT 163 03/14/2018    Antibiotics  :   Anti-infectives (From admission, onward)   Start     Dose/Rate Route Frequency Ordered Stop   03/13/18 1138  ceFEPIme (MAXIPIME) 1 g in sodium chloride 0.9 % 100 mL IVPB     1 g 200 mL/hr over 30 Minutes Intravenous Every 24 hours 03/12/18 1505     03/09/18 1830  levofloxacin (LEVAQUIN) tablet  500 mg  Status:  Discontinued     500 mg Oral Daily 03/09/18 1820 03/09/18 1837   03/09/18 1630  ceFEPIme (MAXIPIME) 1 g in sodium chloride 0.9 % 100 mL IVPB  Status:  Discontinued     1 g 200 mL/hr over 30 Minutes Intravenous Every 12 hours 03/09/18 1548 03/12/18 1505        Objective:   Vitals:   03/13/18 0505 03/13/18 1439 03/13/18 2148 03/14/18 0507  BP: 111/62 138/63 (!) 117/59 (!) 139/56  Pulse: 73 72 73 63  Resp: 18 20 20 18   Temp: 99 F (37.2 C) 98 F (36.7 C) 98.5 F (36.9 C) 98.3 F (36.8 C)  TempSrc: Oral Oral Oral Oral  SpO2: 100% 100% 100% 100%    Wt Readings from Last 3 Encounters:  03/02/18 86.6 kg  03/02/18 86.6 kg  02/28/18 87 kg     Intake/Output Summary (Last 24 hours) at 03/14/2018 1137 Last data filed at 03/14/2018 0931 Gross per 24 hour  Intake 480 ml  Output 1350 ml  Net -870 ml     Physical Exam  Awake Alert, Oriented X 3, No new F.N deficits, Normal affect Symmetrical Chest wall movement, Good air movement bilaterally, CTAB RRR,No Gallops,Rubs or new Murmurs, No Parasternal Heave +ve B.Sounds, Abd Soft, No tenderness, No rebound - guarding or rigidity. No Cyanosis, Clubbing or edema, No new Rash or bruise          Data Review:    CBC Recent Labs  Lab 03/09/18 1450 03/10/18 0356 03/11/18 0511 03/12/18 0418 03/13/18 0454 03/14/18 0435  WBC 19.1* 19.3* 22.3* 19.8* 18.7* 17.2*  HGB 9.3* 8.7* 9.5* 8.6* 8.2* 8.3*  HCT 32.0* 30.0* 33.5* 29.9* 28.8* 29.2*  PLT 148* 153 189 154 152 163  MCV 87.2 86.0 87.0 86.2 86.7 86.4  MCH 25.3* 24.9* 24.7* 24.8* 24.7* 24.6*  MCHC 29.1* 29.0* 28.4* 28.8* 28.5* 28.4*  RDW 17.6* 17.5* 17.9* 17.6* 17.5* 17.6*  LYMPHSABS  3.2 2.1  --   --   --  1.7  MONOABS 1.9* 4.3*  --   --   --  1.2*  EOSABS 0.2 0.4  --   --   --  0.2  BASOSABS 1.5* 0.7*  --   --   --  1.7*    Chemistries  Recent Labs  Lab 03/09/18 1450 03/10/18 0356 03/11/18 0511 03/12/18 0418 03/13/18 0454 03/14/18 0435  NA 139  138 141 138 137 137  K 3.9 3.8 3.9 4.2 4.4 5.5*  CL 109 110 108 109 106 110  CO2 23 22 22 22 22  19*  GLUCOSE 111* 124* 120* 107* 99 86  BUN 51* 49* 42* 45* 45* 52*  CREATININE 1.39* 1.47* 1.42* 1.60* 1.60* 1.53*  CALCIUM 9.7 9.7 10.3 9.7 9.9 9.5  AST 31 29  --   --   --   --   ALT 24 23  --   --   --   --   ALKPHOS 104 93  --   --   --   --   BILITOT 0.3 0.5  --   --   --   --    ------------------------------------------------------------------------------------------------------------------ No results for input(s): CHOL, HDL, LDLCALC, TRIG, CHOLHDL, LDLDIRECT in the last 72 hours.  Lab Results  Component Value Date   HGBA1C 5.1 08/18/2017   ------------------------------------------------------------------------------------------------------------------ No results for input(s): TSH, T4TOTAL, T3FREE, THYROIDAB in the last 72 hours.  Invalid input(s): FREET3 ------------------------------------------------------------------------------------------------------------------ No results for input(s): VITAMINB12, FOLATE, FERRITIN, TIBC, IRON, RETICCTPCT in the last 72 hours.  Coagulation profile No results for input(s): INR, PROTIME in the last 168 hours.  No results for input(s): DDIMER in the last 72 hours.  Cardiac Enzymes No results for input(s): CKMB, TROPONINI, MYOGLOBIN in the last 168 hours.  Invalid input(s): CK ------------------------------------------------------------------------------------------------------------------    Component Value Date/Time   BNP 193.7 (H) 03/09/2018 1450    Inpatient Medications  Scheduled Meds: . amLODipine  5 mg Oral Daily  . aspirin EC  325 mg Oral Daily  . B-complex with vitamin C  1 tablet Oral Daily  . bisacodyl  10 mg Rectal Once  . clotrimazole  1 application Topical BID  . enoxaparin (LOVENOX) injection  40 mg Subcutaneous Q24H  . ferrous gluconate  324 mg Oral Daily  . fluticasone  1 spray Each Nare Daily  . folic acid   1 mg Oral Daily  . hydrALAZINE  75 mg Oral TID   Continuous Infusions: . ceFEPime (MAXIPIME) IV 1 g (03/14/18 1128)   PRN Meds:.acetaminophen **OR** acetaminophen, benzonatate, ondansetron **OR** ondansetron (ZOFRAN) IV  Micro Results Recent Results (from the past 240 hour(s))  Urine culture     Status: Abnormal   Collection Time: 03/09/18  2:32 PM  Result Value Ref Range Status   Specimen Description URINE, RANDOM  Final   Special Requests   Final    NONE Performed at [facility], [place] [address], [place], [place] [address]    Culture 70,000 COLONIES/mL PSEUDOMONAS AERUGINOSA (A)  Final   Report Status 03/11/2018 FINAL  Final   Organism ID, Bacteria PSEUDOMONAS AERUGINOSA (A)  Final      Susceptibility   Pseudomonas aeruginosa - MIC*    CEFTAZIDIME 4 SENSITIVE Sensitive     CIPROFLOXACIN >=4 RESISTANT Resistant     GENTAMICIN 4 SENSITIVE Sensitive     IMIPENEM 2 SENSITIVE Sensitive     PIP/TAZO 32 SENSITIVE Sensitive     CEFEPIME 8  SENSITIVE Sensitive     * 70,000 COLONIES/mL PSEUDOMONAS AERUGINOSA  Blood culture (routine x 2)     Status: None (Preliminary result)   Collection Time: 03/09/18  2:50 PM  Result Value Ref Range Status   Specimen Description BLOOD RIGHT ANTECUBITAL  Final   Special Requests   Final    BOTTLES DRAWN AEROBIC AND ANAEROBIC Blood Culture adequate volume   Culture   Final    NO GROWTH 4 DAYS Performed at [facility], [address] [place], [place] [address]    Report Status PENDING  Incomplete  Blood culture (routine x 2)     Status: None (Preliminary result)   Collection Time: 03/09/18  4:04 PM  Result Value Ref Range Status   Specimen Description BLOOD LEFT HAND  Final   Special Requests   Final    BOTTLES DRAWN AEROBIC AND ANAEROBIC Blood Culture adequate volume   Culture   Final    NO GROWTH 4 DAYS Performed at [facility], [place] [address] [place], [place] [address]    Report Status PENDING  Incomplete  MRSA PCR  Screening     Status: None   Collection Time: 03/10/18  3:48 AM  Result Value Ref Range Status   MRSA by PCR NEGATIVE NEGATIVE Final    Comment:        The GeneXpert MRSA Assay (FDA approved for NASAL specimens only), is one component of a comprehensive MRSA colonization surveillance program. It is not intended to diagnose MRSA infection nor to guide or monitor treatment for MRSA infections. Performed at [facility], [place] [address], [place], [place] [address]     Radiology Reports Dg Chest 2 View  Result Date: 03/09/2018 CLINICAL DATA:  Shortness of breath, chest pain, hypertension, diabetes EXAM: CHEST - 2 VIEW COMPARISON:  09/05/2017 FINDINGS: Stable cardiomegaly with mild perihilar and basilar interstitial prominence suspicious for chronic edema versus interstitial lung disease. Chronic elevation of the right hemidiaphragm. Improvement in the left basilar atelectasis. No large effusion or pneumothorax. Trachea is midline. Aorta atherosclerotic. Diffuse sclerosis of the visualized chest and upper extremities compatible with history of myelofibrosis. IMPRESSION: Stable cardiomegaly with mild chronic interstitial edema versus interstitial lung disease. Chronic right hemidiaphragm elevation Improving left basilar atelectasis Chronic osseous sclerosis compatible with myelofibrosis Atherosclerosis Electronically Signed   By: Jerilynn Mages.  Shick M.D.   On: 03/09/2018 15:29     Phillips Climes M.D on 03/14/2018 at 11:37 AM  Between 7am to 7pm - Pager - [phone]  After 7pm go to [email or web address] - password [facility]  [facility] -  Office  [phone]

## 2018-03-14 NOTE — Progress Notes (Signed)
[facility] OFFICE PROGRESS NOTE  Patient Care Team: Colon Branch, MD as PCP - General [name], MD as PCP - Cardiology (Cardiology) Roxana Hires, MD as Consulting Physician (Hematology and Oncology) [name], MD as Consulting Physician (Hematology and Oncology) Ardis Hughs, MD as Attending Physician (Urology)  HEME/ONC OVERVIEW: 1. 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; bone marrow biopsy and ruxolitinib were discussed, but the patient declined further work-up and treatment at that time -In reviewing the patient's labs, he has had persistent but stable leukocytosis with low percentages of circulating blast and normocytic anemia for a few years -I ordered MPN NGS to confirm CAL-R positivity, given that there is no confirmatory test in the system; however, despite multiple blood draw attempts, we were unable to obtain blood for lab studies today -I discussed in detail with the patient regarding the diagnosis, natural history, and prognosis of myelofibrosis -Ideally, patient would need bone marrow biopsy to confirm myelofibrosis; if confirmed, ruxolitinib would be a potential treatment option for myelofibrosis secondary to ET -However, given the patient's overall frailty and multiple medical comorbidities, bone marrow biopsy would not change his treatment, as he would not be able to tolerate any aggressive treatment -I also  discussed with the patient regarding the increased risk of leukemia transformation and the poor prognosis associated with acute leukemia -At this time, we will continue supportive therapy, including [name] as needed for symptomatic anemia  Recent Pseudomonas UTI -Patient was recently discharged from the hospital after receiving IV antibiotics for Pseudomonas UTI -He still reports minimal dysuria, but denies any fever, persistent dysuria, back pain or hematuria -I encouraged patient to follow-up with his primary care physician closely for any recurrent symptoms concerning for UTI  Goals of care discussion -I discussed with the patient and his son regarding the incurable nature of myelofibrosis; furthermore, I informed them the increased risk of leukemic transformation and the poor prognosis associated with it -Given the patient's frailty and multiple comorbidity, he is not a candidate for any aggressive treatment at this time -I encouraged the patient and his son to discuss with her family regarding goals of care, including his wishes regarding CPR and intubation -The patient and his son expressed understanding and agreed to the plan  Orders Placed This Encounter  Procedures  . CBC with Differential (Cancer Center Only)    Standing Status:   Future    Standing Expiration Date:   04/21/2019  . CMP ([facility] only)    Standing Status:   Future    Standing Expiration Date:   04/21/2019  . Lactate dehydrogenase    Standing Status:   Future    Standing Expiration Date:   04/21/2019  . Technologist smear review    Standing Status:   Future    Standing Expiration Date:   03/18/2019  . JAK2 (including V617F and Exon 12), MPL, and CALR-Next Generation Sequencing    Standing Status:   Future    Standing Expiration Date:   03/17/2019  All questions were answered. The patient knows to call the clinic with any problems, questions or concerns. No barriers to learning was detected.  A total of  more than 40 minutes were spent face-to-face with the patient during this encounter and over half of that time was spent on counseling and coordination of care as outlined above.   Return in 2 weeks for labs and clinic follow-up.  Juan Men, MD 03/17/2018 2:14 PM  CHIEF COMPLAINT: "I am here for blood check"  INTERVAL HISTORY: Juan [name] returns to clinic for follow-up of suspected secondary myelofibrosis.  He was accompanied by his son.  He was recently hospitalized for dysuria and was diagnosed with Pseudomonas UTI, for which he received IV antibiotics with symptomatic improvement.  He was not discharged home with any oral antibiotics.  He reports that since discharge, the dysuria has improved significantly, and he has minimal dysuria at this time.  He denies any fever, chill, hematuria, or back pain.  He endorses chronic stable exertional dyspnea, but denies any weight change, night sweats, chest pain, or palpitations.  REVIEW OF SYSTEMS:   Constitutional: ( - ) fevers, ( - )  chills , ( - ) night sweats Eyes: ( - ) blurriness of vision, ( - ) double vision, ( - ) watery eyes Ears, nose, mouth, throat, and face: ( - ) mucositis, ( - ) sore throat Respiratory: ( - ) cough, ( - ) dyspnea, ( - ) wheezes Cardiovascular: ( - ) palpitation, ( - ) chest discomfort, ( - ) lower extremity swelling Gastrointestinal:  ( - ) nausea, ( - ) heartburn, ( - ) change in bowel habits Skin: ( - ) abnormal skin rashes Lymphatics: ( - ) new lymphadenopathy, ( - ) easy bruising Neurological: ( - ) numbness, ( - ) tingling, ( - ) new weaknesses Behavioral/Psych: ( - ) mood change, ( - ) new changes  All other systems were reviewed with the patient and are negative.  I have reviewed the past medical history, past surgical history, social history and family history with the patient and they are unchanged from previous note.  ALLERGIES:  has No Known Allergies.  MEDICATIONS:  Current Outpatient Medications   Medication Sig Dispense Refill  . acetaminophen (TYLENOL) 325 MG tablet Take 2 tablets (650 mg total) by mouth every 6 (six) hours as needed for mild pain (or Fever >/= 101).    Marland Kitchen amLODipine (NORVASC) 5 MG tablet Take 1 tablet (5 mg total) by mouth daily. For BP 30 tablet 11  . aspirin EC 325 MG tablet Take 325 mg by mouth daily.     Marland Kitchen b complex vitamins tablet Take 1 tablet by mouth daily.    . benzonatate (TESSALON) 100 MG capsule Take 1 capsule (100 mg total) by mouth 3 (three) times daily as needed for cough. 30 capsule 0  . clotrimazole (LOTRIMIN AF) 1 % cream Apply to Lt foot (between) Toes bid for athletes foot (Patient taking differently: Apply 1 application topically 2 (two) times daily. Apply to left foot (between) toes twice daily for athletes foot.) 28 g 0  . ferrous gluconate (FERGON) 240 (27 FE) MG tablet Take 240 mg by mouth daily.    . furosemide (LASIX) 40 MG tablet Take 80 mg (2 tab) in morning and 62m (1tab) in evening (Patient taking differently: Take 40-80 mg by mouth See admin instructions. Take 80 mg (2 tablets) by mouth in morning and 493m(1 tablet) in evening.) 90 tablet  1  . hydrALAZINE (APRESOLINE) 50 MG tablet Take 1.5 tablets (75 mg total) by mouth 3 (three) times daily. 135 tablet 2  . miconazole (MICOTIN) 2 % powder Apply 1 application topically as needed for itching.     . mouth rinse LIQD solution 15 mLs by Mouth Rinse route 2 (two) times daily. 118 mL 0  . ondansetron (ZOFRAN) 4 MG tablet Take 1 tablet (4 mg total) by mouth every 6 (six) hours as needed for nausea or vomiting. 10 tablet 0  . OXYGEN Inhale 2 L into the lungs continuous.    . tamsulosin (FLOMAX) 0.4 MG CAPS capsule Take 1 capsule (0.4 mg total) by mouth daily after supper. 30 capsule 0   No current facility-administered medications for this visit.     PHYSICAL EXAMINATION: ECOG PERFORMANCE STATUS: 3 - Symptomatic, >50% confined to bed  Today's Vitals   03/17/18 0934  Pulse: 68  Resp: 17   Temp: 97.9 F (36.6 C)  TempSrc: Oral  SpO2: 98%  PainSc: 0-No pain   There is no height or weight on file to calculate BMI.  There were no vitals filed for this visit.  GENERAL: alert, no distress and comfortable sitting in a wheelchair, older than stated age, on 2L O2 via [place] SKIN: skin color, texture, turgor are normal, no rashes or significant lesions EYES: conjunctiva are pink and non-injected, sclera clear OROPHARYNX: no exudate, no erythema; lips, buccal mucosa, and tongue normal  NECK: supple, non-tender LYMPH:  no palpable lymphadenopathy in the cervical or axillary  LUNGS: clear to auscultation and percussion with normal breathing effort HEART: regular rate & rhythm and no murmurs and no lower extremity edema ABDOMEN: soft, non-tender, non-distended, normal bowel sounds Musculoskeletal: no cyanosis of digits and no clubbing  PSYCH: alert & oriented x 3, fluent speech  LABORATORY DATA:  I have reviewed the data as listed    Component Value Date/Time   NA 139 03/15/2018 0541   NA 142 02/28/2018 1202   NA 143 03/30/2017 1420   NA 138 11/16/2013 0847   K 4.2 03/15/2018 0541   K 4.3 03/30/2017 1420   K 4.5 11/16/2013 0847   CL 106 03/15/2018 0541   CL 110 (H) 03/30/2017 1420   CO2 22 03/15/2018 0541   CO2 27 03/30/2017 1420   CO2 22 11/16/2013 0847   GLUCOSE 105 (H) 03/15/2018 0541   GLUCOSE 106 03/30/2017 1420   BUN 48 (H) 03/15/2018 0541   BUN 68 (H) 02/28/2018 1202   BUN 38 (H) 03/30/2017 1420   BUN 20.2 11/16/2013 0847   CREATININE 1.33 (H) 03/15/2018 0541   CREATININE 1.50 (H) 03/02/2018 1212   CREATININE 1.3 (H) 03/30/2017 1420   CREATININE 1.2 11/16/2013 0847   CALCIUM 9.4 03/15/2018 0541   CALCIUM 9.8 03/30/2017 1420   CALCIUM 9.8 11/16/2013 0847   PROT 6.2 (L) 03/10/2018 0356   PROT 6.0 (L) 03/30/2017 1420   PROT 6.7 11/16/2013 0847   ALBUMIN 3.2 (L) 03/10/2018 0356   ALBUMIN 2.8 (L) 03/30/2017 1420   ALBUMIN 3.4 (L) 11/16/2013 0847   AST 29  03/10/2018 0356   AST 33 03/02/2018 1212   AST 25 11/16/2013 0847   ALT 23 03/10/2018 0356   ALT 27 03/02/2018 1212   ALT 22 03/30/2017 1420   ALT 11 11/16/2013 0847   ALKPHOS 93 03/10/2018 0356   ALKPHOS 116 (H) 03/30/2017 1420   ALKPHOS 125 11/16/2013 0847   BILITOT 0.5 03/10/2018 0356   BILITOT 0.4  03/02/2018 1212   BILITOT 0.35 11/16/2013 0847   GFRNONAA 50 (L) 03/15/2018 0541   GFRNONAA 57 (L) 09/22/2017 0818   GFRAA 58 (L) 03/15/2018 0541   GFRAA >60 09/22/2017 0818    No results found for: SPEP, UPEP  Lab Results  Component Value Date   WBC 17.2 (H) 03/14/2018   NEUTROABS 11.4 (H) 03/14/2018   HGB 8.3 (L) 03/14/2018   HCT 29.2 (L) 03/14/2018   MCV 86.4 03/14/2018   PLT 163 03/14/2018      Chemistry      Component Value Date/Time   NA 139 03/15/2018 0541   NA 142 02/28/2018 1202   NA 143 03/30/2017 1420   NA 138 11/16/2013 0847   K 4.2 03/15/2018 0541   K 4.3 03/30/2017 1420   K 4.5 11/16/2013 0847   CL 106 03/15/2018 0541   CL 110 (H) 03/30/2017 1420   CO2 22 03/15/2018 0541   CO2 27 03/30/2017 1420   CO2 22 11/16/2013 0847   BUN 48 (H) 03/15/2018 0541   BUN 68 (H) 02/28/2018 1202   BUN 38 (H) 03/30/2017 1420   BUN 20.2 11/16/2013 0847   CREATININE 1.33 (H) 03/15/2018 0541   CREATININE 1.50 (H) 03/02/2018 1212   CREATININE 1.3 (H) 03/30/2017 1420   CREATININE 1.2 11/16/2013 0847   GLU 93 05/24/2017      Component Value Date/Time   CALCIUM 9.4 03/15/2018 0541   CALCIUM 9.8 03/30/2017 1420   CALCIUM 9.8 11/16/2013 0847   ALKPHOS 93 03/10/2018 0356   ALKPHOS 116 (H) 03/30/2017 1420   ALKPHOS 125 11/16/2013 0847   AST 29 03/10/2018 0356   AST 33 03/02/2018 1212   AST 25 11/16/2013 0847   ALT 23 03/10/2018 0356   ALT 27 03/02/2018 1212   ALT 22 03/30/2017 1420   ALT 11 11/16/2013 0847   BILITOT 0.5 03/10/2018 0356   BILITOT 0.4 03/02/2018 1212   BILITOT 0.35 11/16/2013 0847       RADIOGRAPHIC STUDIES: I have personally reviewed the  radiological images as listed below and agreed with the findings in the report. Dg Chest 2 View  Result Date: 03/09/2018 CLINICAL DATA:  Shortness of breath, chest pain, hypertension, diabetes EXAM: CHEST - 2 VIEW COMPARISON:  09/05/2017 FINDINGS: Stable cardiomegaly with mild perihilar and basilar interstitial prominence suspicious for chronic edema versus interstitial lung disease. Chronic elevation of the right hemidiaphragm. Improvement in the left basilar atelectasis. No large effusion or pneumothorax. Trachea is midline. Aorta atherosclerotic. Diffuse sclerosis of the visualized chest and upper extremities compatible with history of myelofibrosis. IMPRESSION: Stable cardiomegaly with mild chronic interstitial edema versus interstitial lung disease. Chronic right hemidiaphragm elevation Improving left basilar atelectasis Chronic osseous sclerosis compatible with myelofibrosis Atherosclerosis Electronically Signed   By: Jerilynn Mages.  Shick M.D.   On: 03/09/2018 15:29

## 2018-03-14 NOTE — Progress Notes (Signed)
Physical Therapy Treatment Patient Details Name: Juan [name] MRN: [id number] DOB: May 18, 1941 Today's Date: 03/14/2018    History of Present Illness Pt is a 77 y/o male admitted secondary to UTI. PMH including but not limited to CHF, CKD, CAD, AAA, DM, HTN and chronic respiratory failure (on home O2).    PT Comments    Pt rather self-limiting and particular in how the session will go, such that pt does not learn improved ways of mobilizing, better safety or progress his activity tolerance.   Follow Up Recommendations  SNF;Other (comment)     Equipment Recommendations  None recommended by PT    Recommendations for Other Services       Precautions / Restrictions Precautions Precautions: Fall    Mobility  Bed Mobility Overal bed mobility: Needs Assistance Bed Mobility: Supine to Sit;Sit to Supine     Supine to sit: Min assist Sit to supine: Mod assist   General bed mobility comments: increased time, min assist with use of the rail.  LE's assisted into the bed.  Transfers Overall transfer level: Needs assistance Equipment used: Rolling walker (2 wheeled) Transfers: Sit to/from Stand;Stand Pivot Transfers(raised bed) Sit to Stand: Min guard;From elevated surface Stand pivot transfers: Min assist(to/from BSC)       General transfer comment: bed in excessively elevated position per pt request/demand, min guard for safety  Ambulation/Gait Ambulation/Gait assistance: Min guard Gait Distance (Feet): 160 Feet Assistive device: Rolling walker (2 wheeled) Gait Pattern/deviations: Step-through pattern;Decreased step length - right;Decreased step length - left;Decreased stride length;Shuffle Gait velocity: decreased Gait velocity interpretation: 1.31 - 2.62 ft/sec, indicative of limited community ambulator General Gait Details: mild instability, flexed posture and slightly too far behind the [employer]             Wheelchair Mobility    Modified Rankin (Stroke  Patients Only)       Balance Overall balance assessment: Needs assistance   Sitting balance-Leahy Scale: Fair       Standing balance-Leahy Scale: Poor Standing balance comment: reliant on the RW                            Cognition Arousal/Alertness: Awake/alert Behavior During Therapy: Flat affect Overall Cognitive Status: Impaired/Different from baseline Area of Impairment: Safety/judgement                         Safety/Judgement: Decreased awareness of deficits;Decreased awareness of safety     General Comments: pt is very particular about everything      Exercises      General Comments        Pertinent Vitals/Pain Pain Assessment: No/denies pain    Home Living                      Prior Function            PT Goals (current goals can now be found in the care plan section) Acute Rehab PT Goals Patient Stated Goal: to eat lunch PT Goal Formulation: With patient Time For Goal Achievement: 03/26/18 Potential to Achieve Goals: Fair Progress towards PT goals: Progressing toward goals    Frequency    Min 3X/week      PT Plan Current plan remains appropriate    Co-evaluation              AM-PAC PT "6 Clicks" Daily Activity  Outcome Measure  Difficulty turning over in bed (including adjusting bedclothes, sheets and blankets)?: A Little Difficulty moving from lying on back to sitting on the side of the bed? : Unable Difficulty sitting down on and standing up from a chair with arms (e.g., wheelchair, bedside commode, etc,.)?: Unable Help needed moving to and from a bed to chair (including a wheelchair)?: A Little Help needed walking in hospital room?: A Little Help needed climbing 3-5 steps with a railing? : A Lot 6 Click Score: 13    End of Session   Activity Tolerance: Patient limited by fatigue Patient left: in bed;with call Burgher/phone within reach;with bed alarm set Nurse Communication: Mobility  status PT Visit Diagnosis: Other abnormalities of gait and mobility (R26.89);Muscle weakness (generalized) (M62.81)     Time: 6378-5885 PT Time Calculation (min) (ACUTE ONLY): 27 min  Charges:  $Gait Training: 8-22 mins $Therapeutic Activity: 8-22 mins                     03/14/2018  Juan [name], PT Acute Rehabilitation Services [phone]  (pager) [phone]  (office)   Juan [name] Juan [name] 03/14/2018, 3:53 PM

## 2018-03-15 LAB — BASIC METABOLIC PANEL
Anion gap: 11 (ref 5–15)
BUN: 48 mg/dL — ABNORMAL HIGH (ref 8–23)
CALCIUM: 9.4 mg/dL (ref 8.9–10.3)
CO2: 22 mmol/L (ref 22–32)
Chloride: 106 mmol/L (ref 98–111)
Creatinine, Ser: 1.33 mg/dL — ABNORMAL HIGH (ref 0.61–1.24)
GFR, EST AFRICAN AMERICAN: 58 mL/min — AB (ref >60)
GFR, EST NON AFRICAN AMERICAN: 50 mL/min — AB (ref >60)
GLUCOSE: 105 mg/dL — AB (ref 70–99)
POTASSIUM: 4.2 mmol/L (ref 3.5–5.1)
Sodium: 139 mmol/L (ref 135–145)

## 2018-03-15 MED ORDER — SODIUM CHLORIDE 0.9 % IV SOLN
1.0000 g | INTRAVENOUS | Status: AC
  Administered 2018-03-15: 1 g via INTRAVENOUS
  Filled 2018-03-15: qty 1

## 2018-03-15 NOTE — Progress Notes (Signed)
[name] to be D/C'd per MD order.  Discussed with the patient and all questions fully answered.  VSS, Skin clean, dry and intact without evidence of skin break down, no evidence of skin tears noted. IV catheter discontinued intact. Site without signs and symptoms of complications. Dressing and pressure applied.  An After Visit Summary was printed and given to the patient. Patient received prescription.  D/c education completed with patient/family including follow up instructions, medication list, d/c activities limitations if indicated, with other d/c instructions as indicated by MD - patient able to verbalize understanding, all questions fully answered.   Patient instructed to return to ED, call 911, or call MD for any changes in condition.   Patient escorted via PTAR to home.

## 2018-03-15 NOTE — Discharge Instructions (Signed)
Follow with Primary MD Colon Branch, MD in 7 days   Get CBC, CMP,checked  by Primary MD next visit.    Activity: As tolerated with Full fall precautions use walker/cane & assistance as needed   Disposition Home   Diet: Heart Healthy low-salt,, with feeding assistance and aspiration precautions.  For Heart failure patients - Check your Weight same time everyday, if you gain over 2 pounds, or you develop in leg swelling, experience more shortness of breath or chest pain, call your Primary MD immediately. Follow Cardiac Low Salt Diet and 1.5 lit/day fluid restriction.   On your next visit with your primary care physician please Get Medicines reviewed and adjusted.   Please request your Prim.MD to go over all Hospital Tests and Procedure/Radiological results at the follow up, please get all Hospital records sent to your Prim MD by signing hospital release before you go home.   If you experience worsening of your admission symptoms, develop shortness of breath, life threatening emergency, suicidal or homicidal thoughts you must seek medical attention immediately by calling 911 or calling your MD immediately  if symptoms less severe.  You Must read complete instructions/literature along with all the possible adverse reactions/side effects for all the Medicines you take and that have been prescribed to you. Take any new Medicines after you have completely understood and accpet all the possible adverse reactions/side effects.   Do not drive, operating heavy machinery, perform activities at heights, swimming or participation in water activities or provide baby sitting services if your were admitted for syncope or siezures until you have seen by Primary MD or a Neurologist and advised to do so again.  Do not drive when taking Pain medications.    Do not take more than prescribed Pain, Sleep and Anxiety Medications  Special Instructions: If you have smoked or chewed Tobacco  in the last 2 yrs  please stop smoking, stop any regular Alcohol  and or any Recreational drug use.  Wear Seat belts while driving.   Please note  You were cared for by a hospitalist during your hospital stay. If you have any questions about your discharge medications or the care you received while you were in the hospital after you are discharged, you can call the unit and asked to speak with the hospitalist on call if the hospitalist that took care of you is not available. Once you are discharged, your primary care physician will handle any further medical issues. Please note that NO REFILLS for any discharge medications will be authorized once you are discharged, as it is imperative that you return to your primary care physician (or establish a relationship with a primary care physician if you do not have one) for your aftercare needs so that they can reassess your need for medications and monitor your lab values.

## 2018-03-15 NOTE — Care Management Note (Addendum)
Case Management Note  Patient Details  Name: Bronc Brosseau MRN: [id number] Date of Birth: 01/24/1941  Subjective/Objective:    UTI Pseudomonas, hx CKD, CHF                Action/Plan: Spoke to pt at bedside. Gave permission to speak to son, [name]. Pt states he has hospital bed, wheelchair, RW, and cane at home. He uses public transportation or taxi to get to his appts. Son lives in home with pt. Pt is refusing SNF and HH. Contacted son via phone. Son states he does not drive but assist pt in the home.   PTAR arranged.  PCP [name] MD  Expected Discharge Date:  03/15/18               Expected Discharge Plan:  [place]  In-House Referral:  Clinical Social Work  Discharge planning Services  CM Consult  Post Acute Care Choice:  Home Health Choice offered to:  Patient  DME Arranged:  N/A DME Agency:  NA  HH Arranged:  Patient Refused HH, Refused SNF [place] Agency:  NA  Status of Service:  Completed, signed off  If discussed at [place] of Stay Meetings, dates discussed:    Additional Comments:  Erenest Rasher, RN 03/15/2018, 2:02 PM

## 2018-03-15 NOTE — Discharge Summary (Signed)
Juan [name], is a 77 y.o. male  DOB August 02, 1940  MRN [id number].  Admission date:  03/09/2018  Admitting Physician  Reubin Milan, MD  Discharge Date:  03/15/2018   Primary MD  Colon Branch, MD  Recommendations for primary care physician for things to follow:  -Please check CBC, BMP during next visit, to ensure continued improvement of renal functions and leukocytosis.  Admission Diagnosis  Dysuria [R30.0] Lower urinary tract infectious disease [N39.0]   Discharge Diagnosis  Dysuria [R30.0] Lower urinary tract infectious disease [N39.0]    Principal Problem:   Pseudomonas urinary tract infection Active Problems:   Coronary atherosclerosis   BPH (benign prostatic hyperplasia)   CKD (chronic kidney disease) stage 3, GFR 30-59 ml/min (HCC)   Iron deficiency anemia due to chronic blood loss      Past Medical History:  Diagnosis Date  . Allergic rhinitis   . Anemia   . Ascending aortic aneurysm ([place]) 03/2014   4.3cm on CT scan  . CAD (coronary artery disease)    dx elsewheer in past, no documentation. Non-ischemic myoview 2007  . Chronic diastolic CHF (congestive heart failure), NYHA class 2 (HCC)    Normal EF w/ grade 1 dd by echo 12/2015  . Chronic respiratory failure with hypoxia/2L at home    "2L; all the time" (05/03/2017)  . CKD (chronic kidney disease), stage III ([place])   . Edema    R>L leg, u/s 5-12 neg for DVT  . Hemorrhoid   . History of hydronephrosis   . History of Splenic infarct   . History of thrombocytosis   . Hypertension   . Iron deficiency anemia due to chronic blood loss 03/31/2017  . 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 Migraine    "once/wk at least" (07/11/2013)  . Moderate to severe pulmonary hypertension ([place])   . Myeloproliferative disease ([place])   . Pyelonephritis 04/29/2017  . Recurrent Pseudomonas urinary tract infection   . Shortness of breath   . Sinus  congestion   . Sleep apnea, obstructive    at some point used CPAP, was d/c  years ago  . Type II diabetes mellitus ([place])   . UTI (urinary tract infection) 06/2017  . UTI (urinary tract infection) 07/2017    Past Surgical History:  Procedure Laterality Date  . FLEXIBLE SIGMOIDOSCOPY N/A 06/08/2017   Procedure: FLEXIBLE SIGMOIDOSCOPY;  Surgeon: Carol Ada, MD;  Location: [facility];  Service: Endoscopy;  Laterality: N/A;  . IR FLUORO GUIDE CV LINE RIGHT  05/07/2017  . IR FLUORO RM 30-60 MIN  05/07/2017  . IR US GUIDE VASC ACCESS RIGHT  05/07/2017  . TOE SURGERY Right    "tried to straighten out big toe" (07/11/2013)       History of present illness and  Hospital Course:     Kindly see H&P for history of present illness and admission details, please review complete Labs, Consult reports and Test reports for all details in brief  HPI  from the history and physical done on the  day of admission 03/09/2018  HPI: Juan [name] is a 77 y.o. male with medical history significant of allergic rhinitis, anemia, ascending aortic aneurysm, CAD, chronic diastolic CHF, chronic respiratory failure on home oxygen, stage III chronic kidney disease, chronic lower extremities edema, hemorrhoids, history of hydronephrosis, history of splenic infarct, history of thrombocytosis, hypertension, iron deficiency, migraine headaches, moderate to severe pulmonary hypertension, myeloproliferative disease, sleep apnea not on CPAP, type 2 diabetes, rhistory of pyelonephritis, ecurrent Pseudomonas UTI who was referred by his PCP for treatment of Pseudomonas UTI after urine culture revealed resistance to Levaquin and sensitivity only to IV antibiotics.  He denies fever, but complains of chills and fatigue.  He continues to have frequency and mild dysuria.  He denies headache, fever, chills, sore throat, rhinorrhea, wheezing, hemoptysis, chest pain, dyspnea, palpitations, diaphoresis, PND, or orthopnea.  He gets frequent  lower extremity edema.  He denies abdominal pain, nausea, emesis, diarrhea, constipation, melena or recent hematochezia.  No polyuria, polydipsia, polyphagia or blurred vision.  ED Course: Initial vital signs temperature 98.2 F, pulse 66, respirations 18, blood pressure 162/70 mmHg O2 sat 100% on nasal cannula oxygen.  The patient received cefepime per pharmacy dosing in the ED.  His urinalysis show a small hemoglobinuria, proteinuria 100 mg/dL, large leukocyte esterase.  RBC was only 0-5 and WBC more than 50 with rare bacteria microscopic exam.  White count was 19.1 with 48% neutrophils, 17% lymphocytes and 10% monocytes.  Hemoglobin 9.3 g/dL and platelets 148.  Lactic acid, troponin and BNP were normal.  His lipase was 30 units/L.  His CMP showed a BUN of 51, creatinine 1.39 and glucose of 111 mg/dL.  Albumin was 3.3 g/dL.  All other values are within normal limits.  Hospital Course    77 y.o.malewith medical history significant ofallergic rhinitis, anemia, ascending aortic aneurysm, CAD, chronic diastolic CHF, chronic respiratory failure on home oxygen, stage III chronic kidney disease, chronic lower extremities edema, hemorrhoids, history of hydronephrosis, history of splenic infarct, history of thrombocytosis, hypertension, iron deficiency, migraine headaches, moderate to severe pulmonary hypertension, myeloproliferative disease, sleep apnea not on CPAP, type 2 diabetes, rhistory of pyelonephritis, ecurrent Pseudomonas UTI who was referred by his PCP for treatment of Pseudomonas UTI after urine culture revealedresistance to Levaquin and sensitivity only to IV antibiotic.   Pseudomonas urinary tract infection -He reports previous UTI infections, by reviewing previous urine cultures, apparently all of them growing Pseudomonas resistant to oral regimen, does report dysuria, polyuria, chills, he does have leukocytosis as well. -Patient with multiple UTIs in the past which has been treated  with oral regimen, all previous urine cultures growing Pseudomonas sensitive only to IV regimen, I have discussed with ID, recommendation is to treat for 5 to 7 days on IV regimen, he was treated with total of 6 days of IV cefepime during hospital stay, no further dysuria, is afebrile, he does remain with leukocytosis, please see discussion below . -Patient still with significant leukocytosis, but he is nontoxic-appearing, at this point unclear if this is related to lymphoproliferative disorder , appears to be chronic with a fluctuating blood cell count in the past, repeat as an outpatient, if still elevated to follow with hematology/oncology regarding further work-up if indicated.  Coronary atherosclerosis -Continue aspirin and amlodipine.  Denies any chest pain or shortness of breath  BPH (benign prostatic hyperplasia) Continue daily Flomax.  AKI onCKD (chronic kidney disease) stage 3, GFR 30-59 ml/min (HCC) -Creatinine peaked at 1.6 during hospital stay, he did receive some IV fluids,  I have held his Lasix for couple days, it is back to baseline around 1.3 and day of discharge, resume home meds including Lasix discharge .  Hyperkalemia -Resolved after receiving Kayexalate  Anemia -He is being followed at [facility] for his anemia, receiving Procrit as needed, he is with known history of a chronic myeloproliferative disorder.     Discharge Condition:  Stable  Patient was seen by PT, recommendation for SNF placement, he declined, as well I recommended home health with PT, he did decline as well   Follow UP  Follow-up Ivor, MD Follow up in 1 week(s).   Specialty:  Internal Medicine Contact information: [address] [place] [address] [phone]             Discharge Instructions  and  Discharge Medications     Discharge Instructions    Discharge instructions   Complete by:  As directed    Follow with  Primary MD Colon Branch, MD in 7 days   Get CBC, CMP,checked  by Primary MD next visit.    Activity: As tolerated with Full fall precautions use walker/cane & assistance as needed   Disposition Home   Diet: Heart Healthy low-salt,, with feeding assistance and aspiration precautions.  For Heart failure patients - Check your Weight same time everyday, if you gain over 2 pounds, or you develop in leg swelling, experience more shortness of breath or chest pain, call your Primary MD immediately. Follow Cardiac Low Salt Diet and 1.5 lit/day fluid restriction.   On your next visit with your primary care physician please Get Medicines reviewed and adjusted.   Please request your Prim.MD to go over all Hospital Tests and Procedure/Radiological results at the follow up, please get all Hospital records sent to your Prim MD by signing hospital release before you go home.   If you experience worsening of your admission symptoms, develop shortness of breath, life threatening emergency, suicidal or homicidal thoughts you must seek medical attention immediately by calling 911 or calling your MD immediately  if symptoms less severe.  You Must read complete instructions/literature along with all the possible adverse reactions/side effects for all the Medicines you take and that have been prescribed to you. Take any new Medicines after you have completely understood and accpet all the possible adverse reactions/side effects.   Do not drive, operating heavy machinery, perform activities at heights, swimming or participation in water activities or provide baby sitting services if your were admitted for syncope or siezures until you have seen by Primary MD or a Neurologist and advised to do so again.  Do not drive when taking Pain medications.    Do not take more than prescribed Pain, Sleep and Anxiety Medications  Special Instructions: If you have smoked or chewed Tobacco  in the last 2 yrs please stop  smoking, stop any regular Alcohol  and or any Recreational drug use.  Wear Seat belts while driving.   Please note  You were cared for by a hospitalist during your hospital stay. If you have any questions about your discharge medications or the care you received while you were in the hospital after you are discharged, you can call the unit and asked to speak with the hospitalist on call if the hospitalist that took care of you is not available. Once you are discharged, your primary care physician will handle any further medical issues. Please note that NO  REFILLS for any discharge medications will be authorized once you are discharged, as it is imperative that you return to your primary care physician (or establish a relationship with a primary care physician if you do not have one) for your aftercare needs so that they can reassess your need for medications and monitor your lab values.   Increase activity slowly   Complete by:  As directed      Allergies as of 03/15/2018   No Known Allergies     Medication List    STOP taking these medications   levofloxacin 500 MG tablet Commonly known as:  LEVAQUIN     TAKE these medications   acetaminophen 325 MG tablet Commonly known as:  TYLENOL Take 2 tablets (650 mg total) by mouth every 6 (six) hours as needed for mild pain (or Fever >/= 101).   amLODipine 5 MG tablet Commonly known as:  NORVASC Take 1 tablet (5 mg total) by mouth daily. For BP   aspirin EC 325 MG tablet Take 325 mg by mouth daily.   b complex vitamins tablet Take 1 tablet by mouth daily.   benzonatate 100 MG capsule Commonly known as:  TESSALON Take 100 mg by mouth 3 (three) times daily as needed for cough.   clotrimazole 1 % cream Commonly known as:  LOTRIMIN Apply to Lt foot (between) Toes bid for athletes foot What changed:    how much to take  how to take this  when to take this  additional instructions   ferrous gluconate 240 (27 FE) MG  tablet Commonly known as:  FERGON Take 240 mg by mouth daily.   furosemide 40 MG tablet Commonly known as:  LASIX Take 80 mg (2 tab) in morning and 40mg  (1tab) in evening What changed:    how much to take  how to take this  when to take this  additional instructions   hydrALAZINE 50 MG tablet Commonly known as:  APRESOLINE Take 1.5 tablets (75 mg total) by mouth 3 (three) times daily.   miconazole 2 % powder Commonly known as:  MICOTIN Apply 1 application topically as needed for itching.   mouth rinse Liqd solution 15 mLs by Mouth Rinse route 2 (two) times daily.   ondansetron 4 MG tablet Commonly known as:  ZOFRAN Take 1 tablet (4 mg total) by mouth every 6 (six) hours as needed for nausea or vomiting.   OXYGEN Inhale 2 L into the lungs continuous.   tamsulosin 0.4 MG Caps capsule Commonly known as:  FLOMAX Take 1 capsule (0.4 mg total) by mouth daily after supper.         Diet and Activity recommendation: See Discharge Instructions above   Consults obtained -  None  Major procedures and Radiology Reports - PLEASE review detailed and final reports for all details, in brief -      Dg Chest 2 View  Result Date: 03/09/2018 CLINICAL DATA:  Shortness of breath, chest pain, hypertension, diabetes EXAM: CHEST - 2 VIEW COMPARISON:  09/05/2017 FINDINGS: Stable cardiomegaly with mild perihilar and basilar interstitial prominence suspicious for chronic edema versus interstitial lung disease. Chronic elevation of the right hemidiaphragm. Improvement in the left basilar atelectasis. No large effusion or pneumothorax. Trachea is midline. Aorta atherosclerotic. Diffuse sclerosis of the visualized chest and upper extremities compatible with history of myelofibrosis. IMPRESSION: Stable cardiomegaly with mild chronic interstitial edema versus interstitial lung disease. Chronic right hemidiaphragm elevation Improving left basilar atelectasis Chronic osseous sclerosis  compatible with myelofibrosis  Atherosclerosis Electronically Signed   By: Jerilynn Mages.  Shick M.D.   On: 03/09/2018 15:29    Micro Results     Recent Results (from the past 240 hour(s))  Urine culture     Status: Abnormal   Collection Time: 03/09/18  2:32 PM  Result Value Ref Range Status   Specimen Description URINE, RANDOM  Final   Special Requests   Final    NONE Performed at [facility], [address] [place], [place] [address]    Culture 70,000 COLONIES/mL PSEUDOMONAS AERUGINOSA (A)  Final   Report Status 03/11/2018 FINAL  Final   Organism ID, Bacteria PSEUDOMONAS AERUGINOSA (A)  Final      Susceptibility   Pseudomonas aeruginosa - MIC*    CEFTAZIDIME 4 SENSITIVE Sensitive     CIPROFLOXACIN >=4 RESISTANT Resistant     GENTAMICIN 4 SENSITIVE Sensitive     IMIPENEM 2 SENSITIVE Sensitive     PIP/TAZO 32 SENSITIVE Sensitive     CEFEPIME 8 SENSITIVE Sensitive     * 70,000 COLONIES/mL PSEUDOMONAS AERUGINOSA  Blood culture (routine x 2)     Status: None   Collection Time: 03/09/18  2:50 PM  Result Value Ref Range Status   Specimen Description BLOOD RIGHT ANTECUBITAL  Final   Special Requests   Final    BOTTLES DRAWN AEROBIC AND ANAEROBIC Blood Culture adequate volume   Culture   Final    NO GROWTH 5 DAYS Performed at [name], [address] [place], [place] [address]    Report Status 03/14/2018 FINAL  Final  Blood culture (routine x 2)     Status: None   Collection Time: 03/09/18  4:04 PM  Result Value Ref Range Status   Specimen Description BLOOD LEFT HAND  Final   Special Requests   Final    BOTTLES DRAWN AEROBIC AND ANAEROBIC Blood Culture adequate volume   Culture   Final    NO GROWTH 5 DAYS Performed at [facility], [place] [address], [place], [place] [address]    Report Status 03/14/2018 FINAL  Final  MRSA PCR Screening     Status: None   Collection Time: 03/10/18  3:48 AM  Result Value Ref Range Status   MRSA by PCR NEGATIVE NEGATIVE Final     Comment:        The GeneXpert MRSA Assay (FDA approved for NASAL specimens only), is one component of a comprehensive MRSA colonization surveillance program. It is not intended to diagnose MRSA infection nor to guide or monitor treatment for MRSA infections. Performed at [facility], [place] [address], [place], [place] [address]        Today   Subjective:   Juan [name] today has no headache,no chest or  abdominal pain,no new weakness tingling or numbness, feels much better today.   Objective:   Blood pressure (!) 154/69, pulse 73, temperature 98 F (36.7 C), resp. rate 16, SpO2 98 %.   Intake/Output Summary (Last 24 hours) at 03/15/2018 1249 Last data filed at 03/15/2018 0630 Gross per 24 hour  Intake 590 ml  Output 1250 ml  Net -660 ml    Exam Awake Alert, Oriented x 3, No new F.N deficits, Normal affect Symmetrical Chest wall movement, Good air movement bilaterally, CTAB RRR,No Gallops,Rubs or new Murmurs, No Parasternal Heave +ve B.Sounds, Abd Soft, Non tender,No rebound -guarding or rigidity. No Cyanosis, Clubbing , trace edema, No new Rash or bruise  Data Review  CBC w Diff:  Lab Results  Component Value Date   WBC 17.2 (H) 03/14/2018   HGB 8.3 (L) 03/14/2018   HGB 8.8 (L) 03/02/2018   HGB 10.7 (L) 03/30/2017   HGB 9.8 (L) 11/16/2013   HCT 29.2 (L) 03/14/2018   HCT 34.1 (L) 03/30/2017   HCT 30.7 (L) 11/16/2013   PLT 163 03/14/2018   PLT 128 (L) 03/02/2018   PLT 287 Large platelets present 03/30/2017   PLT 323 11/16/2013   LYMPHOPCT 10 03/14/2018   LYMPHOPCT 14.0 03/30/2017   BANDSPCT 3 03/14/2018   MONOPCT 7 03/14/2018   MONOPCT 20.7 (H) 03/30/2017   EOSPCT 1 03/14/2018   EOSPCT 1.6 03/30/2017   BASOPCT 10 03/14/2018   BASOPCT 3.5 (H) 03/30/2017    CMP:  Lab Results  Component Value Date   NA 139 03/15/2018   NA 142 02/28/2018   NA 143 03/30/2017   NA 138 11/16/2013   K 4.2 03/15/2018   K 4.3 03/30/2017   K 4.5 11/16/2013   CL  106 03/15/2018   CL 110 (H) 03/30/2017   CO2 22 03/15/2018   CO2 27 03/30/2017   CO2 22 11/16/2013   BUN 48 (H) 03/15/2018   BUN 68 (H) 02/28/2018   BUN 38 (H) 03/30/2017   BUN 20.2 11/16/2013   CREATININE 1.33 (H) 03/15/2018   CREATININE 1.50 (H) 03/02/2018   CREATININE 1.3 (H) 03/30/2017   CREATININE 1.2 11/16/2013   GLU 93 05/24/2017   PROT 6.2 (L) 03/10/2018   PROT 6.0 (L) 03/30/2017   PROT 6.7 11/16/2013   ALBUMIN 3.2 (L) 03/10/2018   ALBUMIN 2.8 (L) 03/30/2017   ALBUMIN 3.4 (L) 11/16/2013   BILITOT 0.5 03/10/2018   BILITOT 0.4 03/02/2018   BILITOT 0.35 11/16/2013   ALKPHOS 93 03/10/2018   ALKPHOS 116 (H) 03/30/2017   ALKPHOS 125 11/16/2013   AST 29 03/10/2018   AST 33 03/02/2018   AST 25 11/16/2013   ALT 23 03/10/2018   ALT 27 03/02/2018   ALT 22 03/30/2017   ALT 11 11/16/2013  .   Total Time in preparing paper work, data evaluation and todays exam - 37 minutes  Phillips Climes M.D on 03/15/2018 at 12:49 PM  [facility]   Office  [phone]

## 2018-03-16 ENCOUNTER — Telehealth (HOSPITAL_COMMUNITY): Payer: Self-pay

## 2018-03-16 NOTE — Telephone Encounter (Signed)
Juan [name] stated he was in the hospital for the past week d/t bladder infection and just got out yesterday.   He reported a "tiny" swelling in his legs, no sudden changes in wt. Pt reports the hospital they made him drink tons of water because he was on antibiotics. He states he does well limiting salt.   Ricardo Jericho  PharmD Candidate  [employer]  Class of [phone]

## 2018-03-17 ENCOUNTER — Inpatient Hospital Stay: Payer: Medicare Other

## 2018-03-17 ENCOUNTER — Telehealth: Payer: Self-pay

## 2018-03-17 ENCOUNTER — Telehealth: Payer: Self-pay | Admitting: Internal Medicine

## 2018-03-17 ENCOUNTER — Inpatient Hospital Stay (HOSPITAL_BASED_OUTPATIENT_CLINIC_OR_DEPARTMENT_OTHER): Payer: Medicare Other | Admitting: Hematology

## 2018-03-17 ENCOUNTER — Encounter: Payer: Self-pay | Admitting: Hematology

## 2018-03-17 VITALS — HR 68 | Temp 97.9°F | Resp 17

## 2018-03-17 DIAGNOSIS — R5383 Other fatigue: Secondary | ICD-10-CM | POA: Diagnosis not present

## 2018-03-17 DIAGNOSIS — D471 Chronic myeloproliferative disease: Secondary | ICD-10-CM

## 2018-03-17 DIAGNOSIS — R339 Retention of urine, unspecified: Secondary | ICD-10-CM

## 2018-03-17 DIAGNOSIS — N390 Urinary tract infection, site not specified: Secondary | ICD-10-CM

## 2018-03-17 DIAGNOSIS — D7581 Myelofibrosis: Secondary | ICD-10-CM

## 2018-03-17 DIAGNOSIS — Z7189 Other specified counseling: Secondary | ICD-10-CM | POA: Insufficient documentation

## 2018-03-17 LAB — LACTATE DEHYDROGENASE: LDH: 710 U/L — ABNORMAL HIGH (ref 98–192)

## 2018-03-17 MED ORDER — BENZONATATE 100 MG PO CAPS: 100.0000 mg | ORAL_CAPSULE | Freq: Three times a day (TID) | ORAL | 0 refills | Status: AC | PRN

## 2018-03-17 NOTE — Telephone Encounter (Signed)
TCM called made to patient. Voicemail not set up unable to speak with patient or  Leave message.

## 2018-03-17 NOTE — Telephone Encounter (Signed)
Ok #30, no RF

## 2018-03-17 NOTE — Telephone Encounter (Signed)
Copied from [place]. Topic: Quick Communication - See Telephone Encounter >> Mar 17, 2018  9:17 AM Rosalin Hawking wrote: CRM for notification. See Telephone encounter for: 03/17/18.   Pt came in office stating is needing a refill on benzonatate (TESSALON) 100 MG capsule, pt would like to have rx sent to [facility] (tel [phone]). Pt stated would like to pick up rx by tomorrow. Please advise.

## 2018-03-17 NOTE — Telephone Encounter (Signed)
Rx sent 

## 2018-03-17 NOTE — Telephone Encounter (Signed)
Please advise if okay to refill. 

## 2018-03-17 NOTE — Telephone Encounter (Signed)
03/17/18   Transition Care Management Follow-up Telephone Call  ADMISSION DATE: 03/09/18 DISCHARGE DATE: 03/15/18   How have you been since you were released from the hospital?  Feeling better but feel he may still have bladder infection.  Do you understand why you were in the hospital? Yes  Do you understand the discharge instrcutions? Yes     Items Reviewed:  Medications reviewed: Yes  Allergies reviewed: NKDA  Dietary changes reviewed: Heart healthy Low salt  Referrals reviewed: Appointment scheduled for Hospital follow up 03/28/18 211:00am Patient states he cannot come for the appointment scheduled on the 5th Nov.  Functional Questionnaire:   Activities of Daily Living (ADLs): Patient states he needs assistance at this time with most ADL'S.  Any patient concerns? Would like to make sure his prescriptions are filled. Advised patient Dr. Barnie Alderman' nurse sent medication to his pharmacy today. Concerned about transportation to his appointments.  Confirmed importance and date/time of follow-up visits scheduled:Yes   Confirmed with patient if condition begins to worsen call PCP or go to the ER. Yes    Patient was given the office number and encouragred to call back with questions or concerns.Yes.

## 2018-03-22 ENCOUNTER — Inpatient Hospital Stay: Payer: Self-pay | Admitting: Internal Medicine

## 2018-03-24 ENCOUNTER — Other Ambulatory Visit: Payer: Self-pay

## 2018-03-24 ENCOUNTER — Emergency Department (HOSPITAL_COMMUNITY): Payer: Medicare Other

## 2018-03-24 ENCOUNTER — Inpatient Hospital Stay (HOSPITAL_COMMUNITY)
Admission: EM | Admit: 2018-03-24 | Discharge: 2018-04-02 | DRG: 690 | Disposition: A | Discharge status: Home / Self Care | Payer: Medicare Other | Attending: Family Medicine | Admitting: Family Medicine

## 2018-03-24 DIAGNOSIS — Z7982 Long term (current) use of aspirin: Secondary | ICD-10-CM

## 2018-03-24 DIAGNOSIS — L03113 Cellulitis of right upper limb: Secondary | ICD-10-CM | POA: Diagnosis present

## 2018-03-24 DIAGNOSIS — Z9981 Dependence on supplemental oxygen: Secondary | ICD-10-CM

## 2018-03-24 DIAGNOSIS — J9611 Chronic respiratory failure with hypoxia: Secondary | ICD-10-CM | POA: Diagnosis present

## 2018-03-24 DIAGNOSIS — D7581 Myelofibrosis: Secondary | ICD-10-CM | POA: Diagnosis present

## 2018-03-24 DIAGNOSIS — R52 Pain, unspecified: Secondary | ICD-10-CM | POA: Diagnosis not present

## 2018-03-24 DIAGNOSIS — I10 Essential (primary) hypertension: Secondary | ICD-10-CM | POA: Diagnosis present

## 2018-03-24 DIAGNOSIS — I517 Cardiomegaly: Secondary | ICD-10-CM | POA: Diagnosis not present

## 2018-03-24 DIAGNOSIS — N400 Enlarged prostate without lower urinary tract symptoms: Secondary | ICD-10-CM | POA: Diagnosis present

## 2018-03-24 DIAGNOSIS — I2510 Atherosclerotic heart disease of native coronary artery without angina pectoris: Secondary | ICD-10-CM | POA: Diagnosis present

## 2018-03-24 DIAGNOSIS — E875 Hyperkalemia: Secondary | ICD-10-CM | POA: Diagnosis present

## 2018-03-24 DIAGNOSIS — I130 Hypertensive heart and chronic kidney disease with heart failure and stage 1 through stage 4 chronic kidney disease, or unspecified chronic kidney disease: Secondary | ICD-10-CM | POA: Diagnosis not present

## 2018-03-24 DIAGNOSIS — D638 Anemia in other chronic diseases classified elsewhere: Secondary | ICD-10-CM | POA: Diagnosis present

## 2018-03-24 DIAGNOSIS — M79641 Pain in right hand: Secondary | ICD-10-CM

## 2018-03-24 DIAGNOSIS — N183 Chronic kidney disease, stage 3 unspecified: Secondary | ICD-10-CM | POA: Diagnosis present

## 2018-03-24 DIAGNOSIS — E119 Type 2 diabetes mellitus without complications: Secondary | ICD-10-CM

## 2018-03-24 DIAGNOSIS — Z87891 Personal history of nicotine dependence: Secondary | ICD-10-CM

## 2018-03-24 DIAGNOSIS — R609 Edema, unspecified: Secondary | ICD-10-CM

## 2018-03-24 DIAGNOSIS — I5032 Chronic diastolic (congestive) heart failure: Secondary | ICD-10-CM | POA: Diagnosis present

## 2018-03-24 DIAGNOSIS — B965 Pseudomonas (aeruginosa) (mallei) (pseudomallei) as the cause of diseases classified elsewhere: Secondary | ICD-10-CM | POA: Diagnosis present

## 2018-03-24 DIAGNOSIS — R0902 Hypoxemia: Secondary | ICD-10-CM | POA: Diagnosis not present

## 2018-03-24 DIAGNOSIS — R300 Dysuria: Secondary | ICD-10-CM | POA: Diagnosis not present

## 2018-03-24 DIAGNOSIS — N390 Urinary tract infection, site not specified: Principal | ICD-10-CM | POA: Diagnosis present

## 2018-03-24 DIAGNOSIS — R0602 Shortness of breath: Secondary | ICD-10-CM | POA: Diagnosis not present

## 2018-03-24 DIAGNOSIS — D471 Chronic myeloproliferative disease: Secondary | ICD-10-CM | POA: Diagnosis present

## 2018-03-24 DIAGNOSIS — D469 Myelodysplastic syndrome, unspecified: Secondary | ICD-10-CM | POA: Diagnosis present

## 2018-03-24 DIAGNOSIS — D509 Iron deficiency anemia, unspecified: Secondary | ICD-10-CM | POA: Diagnosis present

## 2018-03-24 DIAGNOSIS — N401 Enlarged prostate with lower urinary tract symptoms: Secondary | ICD-10-CM | POA: Diagnosis present

## 2018-03-24 DIAGNOSIS — R5381 Other malaise: Secondary | ICD-10-CM | POA: Diagnosis present

## 2018-03-24 DIAGNOSIS — Z79899 Other long term (current) drug therapy: Secondary | ICD-10-CM

## 2018-03-24 DIAGNOSIS — E1122 Type 2 diabetes mellitus with diabetic chronic kidney disease: Secondary | ICD-10-CM | POA: Diagnosis present

## 2018-03-24 DIAGNOSIS — G4733 Obstructive sleep apnea (adult) (pediatric): Secondary | ICD-10-CM | POA: Diagnosis present

## 2018-03-24 DIAGNOSIS — D696 Thrombocytopenia, unspecified: Secondary | ICD-10-CM | POA: Diagnosis present

## 2018-03-24 DIAGNOSIS — I712 Thoracic aortic aneurysm, without rupture: Secondary | ICD-10-CM | POA: Diagnosis present

## 2018-03-24 DIAGNOSIS — I272 Pulmonary hypertension, unspecified: Secondary | ICD-10-CM | POA: Diagnosis present

## 2018-03-24 DIAGNOSIS — M109 Gout, unspecified: Secondary | ICD-10-CM | POA: Diagnosis present

## 2018-03-24 DIAGNOSIS — R600 Localized edema: Secondary | ICD-10-CM | POA: Diagnosis not present

## 2018-03-24 LAB — URINALYSIS, ROUTINE W REFLEX MICROSCOPIC
Bilirubin Urine: NEGATIVE
GLUCOSE, UA: NEGATIVE mg/dL
Hgb urine dipstick: NEGATIVE
Ketones, ur: NEGATIVE mg/dL
Nitrite: POSITIVE — AB
PH: 5 (ref 5.0–8.0)
Protein, ur: 100 mg/dL — AB
Specific Gravity, Urine: 1.011 (ref 1.005–1.030)

## 2018-03-24 NOTE — ED Triage Notes (Signed)
Per [facility], Pt called due to bilateral leg swelling worsening over the past few days. Pt denies CP. Pt reports some SHOB. Pt reports lower abdominal pain when urinating. Pt recently had bladder infection at the end of October. Pt has +3 pitting edema.

## 2018-03-24 NOTE — ED Provider Notes (Signed)
[facility] EMERGENCY DEPARTMENT Provider Note   CSN: [id number] Arrival date & time: 03/24/18  2318     History   Chief Complaint Chief Complaint  Patient presents with  . Leg Swelling    bilateral    HPI Juan [name] is a 77 y.o. male.  77 yo M with a chief complaint of bilateral leg swelling.  Patient is having pain from leg swelling.  Having trouble getting around at home though this seems to be his baseline.  Mostly concerned about the leg pain.  Feels that they have been more swollen over the past couple days.  Was here about a month ago and treated for urinary tract infection.  Having dysuria.  Denies fevers.  The history is provided by the patient.  Illness  This is a new problem. The current episode started yesterday. The problem occurs constantly. The problem has not changed since onset.Pertinent negatives include no chest pain, no abdominal pain, no headaches and no shortness of breath. Nothing aggravates the symptoms. Nothing relieves the symptoms. He has tried nothing for the symptoms. The treatment provided no relief.    Past Medical History:  Diagnosis Date  . Allergic rhinitis   . Anemia   . Ascending aortic aneurysm ([place]) 03/2014   4.3cm on CT scan  . CAD (coronary artery disease)    dx elsewheer in past, no documentation. Non-ischemic myoview 2007  . Chronic diastolic CHF (congestive heart failure), NYHA class 2 (HCC)    Normal EF w/ grade 1 dd by echo 12/2015  . Chronic respiratory failure with hypoxia/2L at home    "2L; all the time" (05/03/2017)  . CKD (chronic kidney disease), stage III ([place])   . Edema    R>L leg, u/s 5-12 neg for DVT  . Hemorrhoid   . History of hydronephrosis   . History of Splenic infarct   . History of thrombocytosis   . Hypertension   . Iron deficiency anemia due to chronic blood loss 03/31/2017  . 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 Migraine    "once/wk at least" (07/11/2013)  . Moderate to severe pulmonary  hypertension ([place])   . Myeloproliferative disease ([place])   . Pyelonephritis 04/29/2017  . Recurrent Pseudomonas urinary tract infection   . Shortness of breath   . Sinus congestion   . Sleep apnea, obstructive    at some point used CPAP, was d/c  years ago  . Type II diabetes mellitus ([place])   . UTI (urinary tract infection) 06/2017  . UTI (urinary tract infection) 07/2017    Patient Active Problem List   Diagnosis Date Noted  . Acute lower UTI/H/o Pseudomonas UTI 03/25/2018  . Complicated UTI (urinary tract infection) 03/25/2018  . Goals of care, counseling/discussion 03/17/2018  . UTI (urinary tract infection) 03/17/2018  . ARF (acute renal failure) ([place]) 09/24/2017  . VRE (vancomycin resistant enterococcus) culture positive   . Staphylococcus epidermidis infection   . Acute encephalopathy 08/18/2017  . Sleep apnea, obstructive 08/18/2017  . 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 Pseudomonas urinary tract infection 08/18/2017  . Moderate to severe pulmonary hypertension ([place]) 08/18/2017  . Chronic respiratory failure with hypoxia ([place]) 08/18/2017  . Chronic diastolic CHF (congestive heart failure), NYHA class 2 ([place]) 08/18/2017  . 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 HTN (hypertension) 08/18/2017  . Pyelonephritis 07/17/2017  . Acute GI bleeding 06/07/2017  . Hypokalemia   . Iron deficiency anemia due to chronic blood loss 03/31/2017  . 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 Urinary retention 01/24/2017  . 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 Elevated troponin 11/14/2016  . Recurrent pneumonia 10/31/2016  . Pulmonary hypertension severe   . Sprain of left rotator cuff capsule   . Hyperkalemia   . Paroxysmal SVT (supraventricular tachycardia) ([place]) 04/20/2016  . Type II diabetes mellitus ([place]) 04/20/2016  . Gout 04/20/2016  . B12 deficiency 04/20/2016  . 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 Hydronephrosis    . Thrombocytosis ([place]) 04/07/2016  . T wave inversion in EKG 12/29/2015  . PCP NOTES >>>>>>>>>>>>>>>>> 05/02/2015  . Peripheral edema   . Thoracic aortic aneurysm ([place]) 04/26/2014  . CKD (chronic kidney disease) stage 3, GFR 30-59 ml/min (HCC) 04/26/2014  . Generalized weakness 04/06/2014  . Venous stasis dermatitis of both lower extremities   . Morbid obesity due to excess calories ([place]) 03/29/2014  . 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 Leukocytosis 10/17/2013  . Poor compliance with advise  05/05/2012  . Annual physical exam 01/14/2012  . Weight loss 01/14/2012  . BPH (benign prostatic hyperplasia) 01/14/2012  . 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 Obstructive sleep apnea 09/07/2006  . 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 Coronary atherosclerosis 09/07/2006  . Allergic rhinitis 09/07/2006    Past Surgical History:  Procedure Laterality Date  . FLEXIBLE SIGMOIDOSCOPY N/A 06/08/2017   Procedure: FLEXIBLE SIGMOIDOSCOPY;  Surgeon: Carol Ada, MD;  Location: [facility];  Service: Endoscopy;  Laterality: N/A;  . IR FLUORO GUIDE CV LINE RIGHT  05/07/2017  . IR FLUORO RM 30-60 MIN  05/07/2017  . IR US GUIDE VASC ACCESS RIGHT  05/07/2017  . TOE SURGERY Right    "tried to straighten out big toe" (07/11/2013)        Home Medications    Prior to Admission medications   Medication Sig Start Date End Date Taking? Authorizing Provider  acetaminophen (TYLENOL) 325 MG tablet Take 2 tablets (650 mg total) by mouth every 6 (six) hours as needed for mild pain (or Fever >/= 101). 05/08/17  Yes Rai, Ripudeep K, MD  amLODipine (NORVASC) 5 MG tablet Take 1 tablet (5 mg total) by mouth daily. For BP 02/09/18  Yes Bensimhon, Shaune Pascal, MD  aspirin EC 325 MG tablet Take 325 mg by mouth daily.    Yes [provider]  b complex vitamins tablet Take 1 tablet by mouth daily.   Yes [provider]  benzonatate  (TESSALON) 100 MG capsule Take 1 capsule (100 mg total) by mouth 3 (three) times daily as needed for cough. 03/17/18  Yes Paz, Alda Berthold, MD  clotrimazole (LOTRIMIN AF) 1 % cream Apply to Lt foot (between) Toes bid for athletes foot Patient taking differently: Apply 1 application topically 2 (two) times daily. Apply to left foot (between) toes twice daily for athletes foot. 09/12/17  Yes Emokpae, Courage, MD  ferrous gluconate (FERGON) 240 (27 FE) MG tablet Take 240 mg by mouth daily.   Yes [provider]  furosemide (LASIX) 40 MG tablet Take 80 mg (2 tab) in morning and 40mg  (1tab) in evening Patient taking differently: Take 40-80 mg by mouth See admin instructions. Take 80 mg (2 tablets) by mouth in morning and 40mg  (1 tablet) in evening. 02/09/18  Yes Bensimhon, Shaune Pascal, MD  hydrALAZINE (APRESOLINE) 50 MG tablet Take 1.5 tablets (75 mg total) by mouth 3 (three) times daily. 02/09/18  Yes Bensimhon, Shaune Pascal, MD  miconazole (MICOTIN) 2 %  powder Apply 1 application topically as needed for itching.    Yes [provider]  mouth rinse LIQD solution 15 mLs by Mouth Rinse route 2 (two) times daily. 09/26/17  Yes Shelly Coss, MD  ondansetron (ZOFRAN) 4 MG tablet Take 1 tablet (4 mg total) by mouth every 6 (six) hours as needed for nausea or vomiting. 09/12/17  Yes Emokpae, Courage, MD  OXYGEN Inhale 2 L into the lungs continuous.   Yes [provider]  tamsulosin (FLOMAX) 0.4 MG CAPS capsule Take 1 capsule (0.4 mg total) by mouth daily after supper. 06/13/17  Yes Bonnielee Haff, MD    Family History Family History  Problem Relation Age of Onset  . Schizophrenia Son   . Mental illness Daughter   . Colon cancer Neg Hx   . Prostate cancer Neg Hx   . Heart attack Neg Hx   . Diabetes Neg Hx     Social History Social History   Tobacco Use  . Smoking status: Former Smoker    Packs/day: 0.25    Years: 12.00    Pack years: 3.00    Types: Cigarettes    Last attempt to  quit: 05/18/1966    Years since quitting: 51.8  . Smokeless tobacco: Never Used  . Tobacco comment: quit smoking 45 years ago  Substance Use Topics  . Alcohol use: No    Alcohol/week: 0.0 standard drinks  . Drug use: No     Allergies   Patient has no known allergies.   Review of Systems Review of Systems  Constitutional: Negative for chills and fever.  HENT: Negative for congestion and facial swelling.   Eyes: Negative for discharge and visual disturbance.  Respiratory: Negative for shortness of breath.   Cardiovascular: Positive for leg swelling. Negative for chest pain and palpitations.  Gastrointestinal: Negative for abdominal pain, diarrhea and vomiting.  Genitourinary: Positive for dysuria.  Musculoskeletal: Negative for arthralgias and myalgias.  Skin: Negative for color change and rash.  Neurological: Negative for tremors, syncope and headaches.  Psychiatric/Behavioral: Negative for confusion and dysphoric mood.     Physical Exam Updated Vital Signs BP (!) 178/94 (BP Location: Left Arm) Comment: hydrlazine given as ordered for htn  Pulse 93   Temp 98.1 F (36.7 C) (Oral)   Resp 18   SpO2 94%   Physical Exam  Constitutional: He is oriented to person, place, and time. He appears well-developed and well-nourished.  HENT:  Head: Normocephalic and atraumatic.  Eyes: Pupils are equal, round, and reactive to light. EOM are normal.  Neck: Normal range of motion. Neck supple. JVD (just above the clavicles) present.  Cardiovascular: Normal rate and regular rhythm. Exam reveals no gallop and no friction rub.  No murmur heard. Pulmonary/Chest: No respiratory distress. He has no wheezes. He has rales (bases).  Abdominal: He exhibits no distension. There is no rebound and no guarding.  Musculoskeletal: Normal range of motion. He exhibits edema (3+ to the knees bilaterally).  Neurological: He is alert and oriented to person, place, and time.  Skin: No rash noted. No pallor.    Psychiatric: He has a normal mood and affect. His behavior is normal.  Nursing note and vitals reviewed.    ED Treatments / Results  Labs (all labs ordered are listed, but only abnormal results are displayed) Labs Reviewed  BASIC METABOLIC PANEL - Abnormal; Notable for the following components:      Result Value   Potassium 5.2 (*)    BUN 54 (*)  Creatinine, Ser 1.35 (*)    GFR calc non Af Amer 49 (*)    GFR calc Af Amer 57 (*)    All other components within normal limits  URINALYSIS, ROUTINE W REFLEX MICROSCOPIC - Abnormal; Notable for the following components:   APPearance HAZY (*)    Protein, ur 100 (*)    Nitrite POSITIVE (*)    Leukocytes, UA LARGE (*)    Bacteria, UA FEW (*)    All other components within normal limits  CBC WITH DIFFERENTIAL/PLATELET - Abnormal; Notable for the following components:   WBC 11.9 (*)    RBC 3.19 (*)    Hemoglobin 8.1 (*)    HCT 27.5 (*)    MCH 25.4 (*)    MCHC 29.5 (*)    RDW 17.8 (*)    nRBC 8.1 (*)    Lymphs Abs 4.3 (*)    Basophils Absolute 0.4 (*)    Abs Immature Granulocytes 2.65 (*)    All other components within normal limits  I-STAT CHEM 8, ED - Abnormal; Notable for the following components:   BUN 67 (*)    Creatinine, Ser 1.50 (*)    Calcium, Ion 1.10 (*)    Hemoglobin 8.8 (*)    HCT 26.0 (*)    All other components within normal limits  URINE CULTURE  CULTURE, BLOOD (ROUTINE X 2)  CBC WITH DIFFERENTIAL/PLATELET  BASIC METABOLIC PANEL  CBC    EKG EKG Interpretation  Date/Time:  Thursday March 24 2018 23:32:18 EST Ventricular Rate:  76 PR Interval:    QRS Duration: 103 QT Interval:  412 QTC Calculation: 464 R Axis:   -19 Text Interpretation:  Sinus rhythm Left ventricular hypertrophy Borderline T abnormalities, anterior leads Baseline wander in lead(s) V3 No significant change since last tracing Confirmed by Deno Etienne [phone]) on 03/24/2018 11:48:35 PM   Radiology Dg Chest 2 View  Result Date:  03/24/2018 CLINICAL DATA:  Shortness of breath EXAM: CHEST - 2 VIEW COMPARISON:  03/09/2018, 09/05/2017, 01/23/2017 FINDINGS: Cardiomegaly with vascular congestion and mild diffuse interstitial and ground-glass opacity. No pleural effusion. No pneumothorax. Slight increased atelectasis at the left base. Aortic atherosclerosis. Heterogenous sclerosis within the osseous structures as before. IMPRESSION: 1. Slight increased atelectasis at the left base. 2. Otherwise similar appearance of diffuse bilateral interstitial and ground-glass opacity as compared with multiple prior exams and presumably chronic. 3. Cardiomegaly Electronically Signed   By: Donavan Foil M.D.   On: 03/24/2018 23:59    Procedures Procedures (including critical care time) Procedure note: Ultrasound Guided Peripheral IV Ultrasound guided peripheral 1.88 inch angiocath IV placement performed by me. Indications: Nursing unable to place IV. Details: The antecubital fossa and upper arm were evaluated with a multifrequency linear probe. Patent brachial veins were noted. 1 attempt was made to cannulate a vein under realtime US guidance with successful cannulation of the vein and catheter placement. There is return of non-pulsatile dark red blood. The patient tolerated the procedure well without complications. Images archived electronically.  CPT codes: 201 003 6813 and [phone]  Medications Ordered in ED Medications  ferrous gluconate (FERGON) tablet 324 mg (has no administration in time range)  B-complex with vitamin C tablet 1 tablet (has no administration in time range)  tamsulosin (FLOMAX) capsule 0.4 mg (has no administration in time range)  aspirin EC tablet 325 mg (has no administration in time range)  MEDLINE mouth rinse (has no administration in time range)  hydrALAZINE (APRESOLINE) tablet 75 mg (75 mg Oral Given 03/25/18  0530)  furosemide (LASIX) tablet 40 mg (has no administration in time range)  amLODipine (NORVASC) tablet 5 mg (has no  administration in time range)  benzonatate (TESSALON) capsule 100 mg (has no administration in time range)  sodium chloride flush (NS) 0.9 % injection 3 mL (has no administration in time range)  sodium chloride flush (NS) 0.9 % injection 3 mL (has no administration in time range)  0.9 %  sodium chloride infusion (has no administration in time range)  acetaminophen (TYLENOL) tablet 650 mg (has no administration in time range)    Or  acetaminophen (TYLENOL) suppository 650 mg (has no administration in time range)  traZODone (DESYREL) tablet 50 mg (has no administration in time range)  polyethylene glycol (MIRALAX / GLYCOLAX) packet 17 g (has no administration in time range)  ondansetron (ZOFRAN) tablet 4 mg (has no administration in time range)    Or  ondansetron (ZOFRAN) injection 4 mg (has no administration in time range)  albuterol (PROVENTIL) (2.5 MG/3ML) 0.083% nebulizer solution 2.5 mg (has no administration in time range)  heparin injection 5,000 Units (has no administration in time range)  oxyCODONE (Oxy IR/ROXICODONE) immediate release tablet 5 mg (has no administration in time range)  ceFEPIme (MAXIPIME) 2 g in sodium chloride 0.9 % 100 mL IVPB (has no administration in time range)  furosemide (LASIX) injection 80 mg (80 mg Intravenous Given 03/25/18 0200)  ceFEPIme (MAXIPIME) 2 g in sodium chloride 0.9 % 100 mL IVPB (0 g Intravenous Stopped 03/25/18 0305)     Initial Impression / Assessment and Plan / ED Course  I have reviewed the triage vital signs and the nursing notes.  Pertinent labs & imaging results that were available during my care of the patient were reviewed by me and considered in my medical decision making (see chart for details).     77 yo M with a cc of lower extremity edema.  He states he has been taking more oral intake and.  Likely the cause.  Will check electrolytes renal function will give a bolus of Lasix.    The patient unfortunately looks to have a urinary  tract infection again with positive nitrates and leukocyte esterase.  He also most recently had a urine culture with Pseudomonas this was resistant to the only outpatient therapy.  Will start on cefepime discussed with the hospitalist.  The patients results and plan were reviewed and discussed.   Any x-rays performed were independently reviewed by myself.   Differential diagnosis were considered with the presenting HPI.  Medications  ferrous gluconate (FERGON) tablet 324 mg (has no administration in time range)  B-complex with vitamin C tablet 1 tablet (has no administration in time range)  tamsulosin (FLOMAX) capsule 0.4 mg (has no administration in time range)  aspirin EC tablet 325 mg (has no administration in time range)  MEDLINE mouth rinse (has no administration in time range)  hydrALAZINE (APRESOLINE) tablet 75 mg (75 mg Oral Given 03/25/18 0530)  furosemide (LASIX) tablet 40 mg (has no administration in time range)  amLODipine (NORVASC) tablet 5 mg (has no administration in time range)  benzonatate (TESSALON) capsule 100 mg (has no administration in time range)  sodium chloride flush (NS) 0.9 % injection 3 mL (has no administration in time range)  sodium chloride flush (NS) 0.9 % injection 3 mL (has no administration in time range)  0.9 %  sodium chloride infusion (has no administration in time range)  acetaminophen (TYLENOL) tablet 650 mg (has no administration in time  range)    Or  acetaminophen (TYLENOL) suppository 650 mg (has no administration in time range)  traZODone (DESYREL) tablet 50 mg (has no administration in time range)  polyethylene glycol (MIRALAX / GLYCOLAX) packet 17 g (has no administration in time range)  ondansetron (ZOFRAN) tablet 4 mg (has no administration in time range)    Or  ondansetron (ZOFRAN) injection 4 mg (has no administration in time range)  albuterol (PROVENTIL) (2.5 MG/3ML) 0.083% nebulizer solution 2.5 mg (has no administration in time range)    heparin injection 5,000 Units (has no administration in time range)  oxyCODONE (Oxy IR/ROXICODONE) immediate release tablet 5 mg (has no administration in time range)  ceFEPIme (MAXIPIME) 2 g in sodium chloride 0.9 % 100 mL IVPB (has no administration in time range)  furosemide (LASIX) injection 80 mg (80 mg Intravenous Given 03/25/18 0200)  ceFEPIme (MAXIPIME) 2 g in sodium chloride 0.9 % 100 mL IVPB (0 g Intravenous Stopped 03/25/18 0305)    Vitals:   03/25/18 0045 03/25/18 0100 03/25/18 0315 03/25/18 0415  BP: (!) 159/71 (!) 145/70 (!) 148/70 (!) 178/94  Pulse: 69 72 68 93  Resp: 16 16 (!) 21 18  Temp:    98.1 F (36.7 C)  TempSrc:    Oral  SpO2: 97% 97% 97% 94%    Final diagnoses:  Complicated UTI (urinary tract infection)  Peripheral edema    Admission/ observation were discussed with the admitting physician, patient and/or family and they are comfortable with the plan.    Final Clinical Impressions(s) / ED Diagnoses   Final diagnoses:  Complicated UTI (urinary tract infection)  Peripheral edema    ED Discharge Orders    None       Deno Etienne, DO 03/25/18 [phone]

## 2018-03-25 ENCOUNTER — Encounter (HOSPITAL_COMMUNITY): Payer: Self-pay

## 2018-03-25 ENCOUNTER — Other Ambulatory Visit: Payer: Self-pay

## 2018-03-25 ENCOUNTER — Inpatient Hospital Stay (HOSPITAL_COMMUNITY): Payer: Medicare Other

## 2018-03-25 DIAGNOSIS — M109 Gout, unspecified: Secondary | ICD-10-CM | POA: Diagnosis present

## 2018-03-25 DIAGNOSIS — N183 Chronic kidney disease, stage 3 (moderate): Secondary | ICD-10-CM | POA: Diagnosis not present

## 2018-03-25 DIAGNOSIS — M7989 Other specified soft tissue disorders: Secondary | ICD-10-CM

## 2018-03-25 DIAGNOSIS — R300 Dysuria: Secondary | ICD-10-CM | POA: Diagnosis not present

## 2018-03-25 DIAGNOSIS — M79641 Pain in right hand: Secondary | ICD-10-CM | POA: Diagnosis not present

## 2018-03-25 DIAGNOSIS — Z7982 Long term (current) use of aspirin: Secondary | ICD-10-CM | POA: Diagnosis not present

## 2018-03-25 DIAGNOSIS — Z9981 Dependence on supplemental oxygen: Secondary | ICD-10-CM | POA: Diagnosis not present

## 2018-03-25 DIAGNOSIS — Z794 Long term (current) use of insulin: Secondary | ICD-10-CM | POA: Diagnosis not present

## 2018-03-25 DIAGNOSIS — L03113 Cellulitis of right upper limb: Secondary | ICD-10-CM | POA: Diagnosis present

## 2018-03-25 DIAGNOSIS — G4733 Obstructive sleep apnea (adult) (pediatric): Secondary | ICD-10-CM | POA: Diagnosis present

## 2018-03-25 DIAGNOSIS — D696 Thrombocytopenia, unspecified: Secondary | ICD-10-CM | POA: Diagnosis present

## 2018-03-25 DIAGNOSIS — I712 Thoracic aortic aneurysm, without rupture: Secondary | ICD-10-CM | POA: Diagnosis present

## 2018-03-25 DIAGNOSIS — N390 Urinary tract infection, site not specified: Secondary | ICD-10-CM | POA: Diagnosis present

## 2018-03-25 DIAGNOSIS — R338 Other retention of urine: Secondary | ICD-10-CM | POA: Diagnosis not present

## 2018-03-25 DIAGNOSIS — Z87891 Personal history of nicotine dependence: Secondary | ICD-10-CM | POA: Diagnosis not present

## 2018-03-25 DIAGNOSIS — Z7401 Bed confinement status: Secondary | ICD-10-CM | POA: Diagnosis not present

## 2018-03-25 DIAGNOSIS — E1122 Type 2 diabetes mellitus with diabetic chronic kidney disease: Secondary | ICD-10-CM | POA: Diagnosis present

## 2018-03-25 DIAGNOSIS — R609 Edema, unspecified: Secondary | ICD-10-CM

## 2018-03-25 DIAGNOSIS — R5381 Other malaise: Secondary | ICD-10-CM | POA: Diagnosis present

## 2018-03-25 DIAGNOSIS — B965 Pseudomonas (aeruginosa) (mallei) (pseudomallei) as the cause of diseases classified elsewhere: Secondary | ICD-10-CM | POA: Diagnosis present

## 2018-03-25 DIAGNOSIS — E1142 Type 2 diabetes mellitus with diabetic polyneuropathy: Secondary | ICD-10-CM | POA: Diagnosis not present

## 2018-03-25 DIAGNOSIS — N401 Enlarged prostate with lower urinary tract symptoms: Secondary | ICD-10-CM | POA: Diagnosis present

## 2018-03-25 DIAGNOSIS — I2510 Atherosclerotic heart disease of native coronary artery without angina pectoris: Secondary | ICD-10-CM | POA: Diagnosis present

## 2018-03-25 DIAGNOSIS — E875 Hyperkalemia: Secondary | ICD-10-CM | POA: Diagnosis present

## 2018-03-25 DIAGNOSIS — D509 Iron deficiency anemia, unspecified: Secondary | ICD-10-CM | POA: Diagnosis not present

## 2018-03-25 DIAGNOSIS — D638 Anemia in other chronic diseases classified elsewhere: Secondary | ICD-10-CM | POA: Diagnosis present

## 2018-03-25 DIAGNOSIS — D469 Myelodysplastic syndrome, unspecified: Secondary | ICD-10-CM | POA: Diagnosis present

## 2018-03-25 DIAGNOSIS — I5032 Chronic diastolic (congestive) heart failure: Secondary | ICD-10-CM | POA: Diagnosis present

## 2018-03-25 DIAGNOSIS — I272 Pulmonary hypertension, unspecified: Secondary | ICD-10-CM | POA: Diagnosis present

## 2018-03-25 DIAGNOSIS — D7581 Myelofibrosis: Secondary | ICD-10-CM | POA: Diagnosis not present

## 2018-03-25 DIAGNOSIS — D471 Chronic myeloproliferative disease: Secondary | ICD-10-CM | POA: Diagnosis present

## 2018-03-25 DIAGNOSIS — M2550 Pain in unspecified joint: Secondary | ICD-10-CM | POA: Diagnosis not present

## 2018-03-25 DIAGNOSIS — I10 Essential (primary) hypertension: Secondary | ICD-10-CM | POA: Diagnosis not present

## 2018-03-25 DIAGNOSIS — I130 Hypertensive heart and chronic kidney disease with heart failure and stage 1 through stage 4 chronic kidney disease, or unspecified chronic kidney disease: Secondary | ICD-10-CM | POA: Diagnosis present

## 2018-03-25 DIAGNOSIS — J9611 Chronic respiratory failure with hypoxia: Secondary | ICD-10-CM | POA: Diagnosis present

## 2018-03-25 LAB — CBC WITH DIFFERENTIAL/PLATELET
Abs Immature Granulocytes: 2.65 10*3/uL — ABNORMAL HIGH (ref 0.00–0.07)
BASOS ABS: 0.4 10*3/uL — AB (ref 0.0–0.1)
Basophils Relative: 3 %
Eosinophils Absolute: 0.2 10*3/uL (ref 0.0–0.5)
Eosinophils Relative: 2 %
HCT: 27.5 % — ABNORMAL LOW (ref 39.0–52.0)
HEMOGLOBIN: 8.1 g/dL — AB (ref 13.0–17.0)
Immature Granulocytes: 22 %
LYMPHS ABS: 4.3 10*3/uL — AB (ref 0.7–4.0)
Lymphocytes Relative: 36 %
MCH: 25.4 pg — ABNORMAL LOW (ref 26.0–34.0)
MCHC: 29.5 g/dL — ABNORMAL LOW (ref 30.0–36.0)
MCV: 86.2 fL (ref 80.0–100.0)
Monocytes Absolute: 0.9 10*3/uL (ref 0.1–1.0)
Monocytes Relative: 8 %
NRBC: 8.1 % — AB (ref 0.0–0.2)
Neutro Abs: 3.4 10*3/uL (ref 1.7–7.7)
Neutrophils Relative %: 29 %
Platelets: 163 10*3/uL (ref 150–400)
RBC: 3.19 MIL/uL — ABNORMAL LOW (ref 4.22–5.81)
RDW: 17.8 % — ABNORMAL HIGH (ref 11.5–15.5)
WBC: 11.9 10*3/uL — ABNORMAL HIGH (ref 4.0–10.5)

## 2018-03-25 LAB — I-STAT CHEM 8, ED
BUN: 67 mg/dL — AB (ref 8–23)
CALCIUM ION: 1.1 mmol/L — AB (ref 1.15–1.40)
CREATININE: 1.5 mg/dL — AB (ref 0.61–1.24)
Chloride: 107 mmol/L (ref 98–111)
Glucose, Bld: 96 mg/dL (ref 70–99)
HCT: 26 % — ABNORMAL LOW (ref 39.0–52.0)
Hemoglobin: 8.8 g/dL — ABNORMAL LOW (ref 13.0–17.0)
Potassium: 5.1 mmol/L (ref 3.5–5.1)
SODIUM: 138 mmol/L (ref 135–145)
TCO2: 29 mmol/L (ref 22–32)

## 2018-03-25 LAB — CBC
HCT: 26.9 % — ABNORMAL LOW (ref 39.0–52.0)
Hemoglobin: 7.9 g/dL — ABNORMAL LOW (ref 13.0–17.0)
MCH: 25 pg — ABNORMAL LOW (ref 26.0–34.0)
MCHC: 29.4 g/dL — ABNORMAL LOW (ref 30.0–36.0)
MCV: 85.1 fL (ref 80.0–100.0)
Platelets: 159 10*3/uL (ref 150–400)
RBC: 3.16 MIL/uL — ABNORMAL LOW (ref 4.22–5.81)
RDW: 17.8 % — ABNORMAL HIGH (ref 11.5–15.5)
WBC: 10.7 10*3/uL — ABNORMAL HIGH (ref 4.0–10.5)
nRBC: 8 % — ABNORMAL HIGH (ref 0.0–0.2)

## 2018-03-25 LAB — BASIC METABOLIC PANEL
ANION GAP: 8 (ref 5–15)
ANION GAP: 9 (ref 5–15)
BUN: 54 mg/dL — ABNORMAL HIGH (ref 8–23)
BUN: 52 mg/dL — ABNORMAL HIGH (ref 8–23)
CHLORIDE: 108 mmol/L (ref 98–111)
CHLORIDE: 107 mmol/L (ref 98–111)
CO2: 23 mmol/L (ref 22–32)
CO2: 25 mmol/L (ref 22–32)
CREATININE: 1.33 mg/dL — AB (ref 0.61–1.24)
Calcium: 9.2 mg/dL (ref 8.9–10.3)
Calcium: 9.4 mg/dL (ref 8.9–10.3)
Creatinine, Ser: 1.35 mg/dL — ABNORMAL HIGH (ref 0.61–1.24)
GFR calc non Af Amer: 49 mL/min — ABNORMAL LOW (ref >60)
GFR calc non Af Amer: 50 mL/min — ABNORMAL LOW (ref >60)
GFR, EST AFRICAN AMERICAN: 58 mL/min — AB (ref >60)
GFR, EST AFRICAN AMERICAN: 57 mL/min — AB (ref >60)
GLUCOSE: 100 mg/dL — AB (ref 70–99)
GLUCOSE: 98 mg/dL (ref 70–99)
Potassium: 5.2 mmol/L — ABNORMAL HIGH (ref 3.5–5.1)
Potassium: 4 mmol/L (ref 3.5–5.1)
Sodium: 139 mmol/L (ref 135–145)
Sodium: 141 mmol/L (ref 135–145)

## 2018-03-25 MED ORDER — SODIUM CHLORIDE 0.9 % IV SOLN
2.0000 g | Freq: Three times a day (TID) | INTRAVENOUS | Status: DC
  Administered 2018-03-25: 2 g via INTRAVENOUS
  Filled 2018-03-25 (×2): qty 2

## 2018-03-25 MED ORDER — TRAZODONE HCL 50 MG PO TABS
50.0000 mg | ORAL_TABLET | Freq: Every evening | ORAL | Status: DC | PRN
  Administered 2018-03-28 – 2018-03-30 (×2): 50 mg via ORAL
  Filled 2018-03-25 (×2): qty 1

## 2018-03-25 MED ORDER — ALBUTEROL SULFATE (2.5 MG/3ML) 0.083% IN NEBU: 2.5000 mg | INHALATION_SOLUTION | RESPIRATORY_TRACT | Status: DC | PRN

## 2018-03-25 MED ORDER — ONDANSETRON HCL 4 MG PO TABS
4.0000 mg | ORAL_TABLET | Freq: Four times a day (QID) | ORAL | Status: DC | PRN
  Administered 2018-03-30: 4 mg via ORAL
  Filled 2018-03-25: qty 1

## 2018-03-25 MED ORDER — ONDANSETRON HCL 4 MG PO TABS: 4.0000 mg | ORAL_TABLET | Freq: Four times a day (QID) | ORAL | Status: DC | PRN

## 2018-03-25 MED ORDER — SODIUM CHLORIDE 0.9 % IV SOLN
2.0000 g | Freq: Once | INTRAVENOUS | Status: AC
  Administered 2018-03-25: 2 g via INTRAVENOUS
  Filled 2018-03-25: qty 2

## 2018-03-25 MED ORDER — OXYCODONE HCL 5 MG PO TABS
5.0000 mg | ORAL_TABLET | ORAL | Status: DC | PRN
  Administered 2018-03-26 (×2): 5 mg via ORAL
  Filled 2018-03-25 (×2): qty 1

## 2018-03-25 MED ORDER — HEPARIN SODIUM (PORCINE) 5000 UNIT/ML IJ SOLN
5000.0000 [IU] | Freq: Three times a day (TID) | INTRAMUSCULAR | Status: DC
  Administered 2018-03-25 – 2018-04-02 (×24): 5000 [IU] via SUBCUTANEOUS
  Filled 2018-03-25 (×24): qty 1

## 2018-03-25 MED ORDER — FERROUS GLUCONATE 324 (38 FE) MG PO TABS
324.0000 mg | ORAL_TABLET | Freq: Every day | ORAL | Status: DC
  Administered 2018-03-25 – 2018-04-02 (×9): 324 mg via ORAL
  Filled 2018-03-25 (×9): qty 1

## 2018-03-25 MED ORDER — DIPHENHYDRAMINE-ZINC ACETATE 2-0.1 % EX CREA
TOPICAL_CREAM | Freq: Three times a day (TID) | CUTANEOUS | Status: DC | PRN
  Administered 2018-03-25: 18:00:00 via TOPICAL
  Administered 2018-03-26: 1 via TOPICAL
  Filled 2018-03-25: qty 28

## 2018-03-25 MED ORDER — SODIUM CHLORIDE 0.9% FLUSH: 3.0000 mL | INTRAVENOUS | Status: DC | PRN

## 2018-03-25 MED ORDER — SODIUM CHLORIDE 0.9 % IV SOLN
250.0000 mL | INTRAVENOUS | Status: DC | PRN
  Administered 2018-03-25: 250 mL via INTRAVENOUS

## 2018-03-25 MED ORDER — AMLODIPINE BESYLATE 5 MG PO TABS: 5.0000 mg | ORAL_TABLET | Freq: Every day | ORAL | Status: DC

## 2018-03-25 MED ORDER — LABETALOL HCL 5 MG/ML IV SOLN
10.0000 mg | INTRAVENOUS | Status: DC | PRN
  Filled 2018-03-25: qty 4

## 2018-03-25 MED ORDER — ASPIRIN EC 325 MG PO TBEC
325.0000 mg | DELAYED_RELEASE_TABLET | Freq: Every day | ORAL | Status: DC
  Administered 2018-03-25 – 2018-04-02 (×9): 325 mg via ORAL
  Filled 2018-03-25 (×9): qty 1

## 2018-03-25 MED ORDER — ORAL CARE MOUTH RINSE
15.0000 mL | Freq: Two times a day (BID) | OROMUCOSAL | Status: DC
  Administered 2018-03-25 – 2018-04-02 (×13): 15 mL via OROMUCOSAL

## 2018-03-25 MED ORDER — ACETAMINOPHEN 650 MG RE SUPP: 650.0000 mg | Freq: Four times a day (QID) | RECTAL | Status: DC | PRN

## 2018-03-25 MED ORDER — TAMSULOSIN HCL 0.4 MG PO CAPS
0.4000 mg | ORAL_CAPSULE | Freq: Every day | ORAL | Status: DC
  Administered 2018-03-25 – 2018-04-01 (×8): 0.4 mg via ORAL
  Filled 2018-03-25 (×8): qty 1

## 2018-03-25 MED ORDER — ONDANSETRON HCL 4 MG/2ML IJ SOLN: 4.0000 mg | Freq: Four times a day (QID) | INTRAMUSCULAR | Status: DC | PRN

## 2018-03-25 MED ORDER — BENZONATATE 100 MG PO CAPS: 100.0000 mg | ORAL_CAPSULE | Freq: Three times a day (TID) | ORAL | Status: DC | PRN

## 2018-03-25 MED ORDER — SODIUM CHLORIDE 0.9 % IV SOLN
1.0000 g | Freq: Two times a day (BID) | INTRAVENOUS | Status: AC
  Administered 2018-03-25 – 2018-03-29 (×9): 1 g via INTRAVENOUS
  Filled 2018-03-25 (×9): qty 1

## 2018-03-25 MED ORDER — SODIUM CHLORIDE 0.9% FLUSH
3.0000 mL | Freq: Two times a day (BID) | INTRAVENOUS | Status: DC
  Administered 2018-03-25 – 2018-04-02 (×10): 3 mL via INTRAVENOUS

## 2018-03-25 MED ORDER — ISOSORBIDE MONONITRATE ER 30 MG PO TB24
30.0000 mg | ORAL_TABLET | Freq: Every day | ORAL | Status: DC
  Administered 2018-03-25 – 2018-04-02 (×9): 30 mg via ORAL
  Filled 2018-03-25 (×9): qty 1

## 2018-03-25 MED ORDER — ACETAMINOPHEN 325 MG PO TABS
650.0000 mg | ORAL_TABLET | Freq: Four times a day (QID) | ORAL | Status: DC | PRN
  Administered 2018-03-25 – 2018-03-31 (×7): 650 mg via ORAL
  Filled 2018-03-25 (×7): qty 2

## 2018-03-25 MED ORDER — POLYETHYLENE GLYCOL 3350 17 G PO PACK: 17.0000 g | PACK | Freq: Every day | ORAL | Status: DC | PRN

## 2018-03-25 MED ORDER — HYDRALAZINE HCL 50 MG PO TABS
100.0000 mg | ORAL_TABLET | Freq: Three times a day (TID) | ORAL | Status: DC
  Administered 2018-03-25 – 2018-04-02 (×22): 100 mg via ORAL
  Filled 2018-03-25 (×24): qty 2

## 2018-03-25 MED ORDER — FUROSEMIDE 40 MG PO TABS
40.0000 mg | ORAL_TABLET | Freq: Every day | ORAL | Status: DC
  Administered 2018-03-25 – 2018-03-30 (×6): 40 mg via ORAL
  Filled 2018-03-25 (×6): qty 1

## 2018-03-25 MED ORDER — FUROSEMIDE 10 MG/ML IJ SOLN
80.0000 mg | Freq: Once | INTRAMUSCULAR | Status: AC
  Administered 2018-03-25: 80 mg via INTRAVENOUS
  Filled 2018-03-25: qty 8

## 2018-03-25 MED ORDER — B COMPLEX-C PO TABS
1.0000 | ORAL_TABLET | Freq: Every day | ORAL | Status: DC
  Administered 2018-03-25 – 2018-04-02 (×9): 1 via ORAL
  Filled 2018-03-25 (×10): qty 1

## 2018-03-25 MED ORDER — HYDROCORTISONE 1 % EX CREA
1.0000 "application " | TOPICAL_CREAM | Freq: Three times a day (TID) | CUTANEOUS | Status: DC | PRN
  Administered 2018-03-25: 1 via TOPICAL
  Filled 2018-03-25: qty 28

## 2018-03-25 MED ORDER — HYDRALAZINE HCL 50 MG PO TABS
75.0000 mg | ORAL_TABLET | Freq: Three times a day (TID) | ORAL | Status: DC
  Administered 2018-03-25: 75 mg via ORAL
  Filled 2018-03-25: qty 1

## 2018-03-25 NOTE — ED Notes (Signed)
RN attempted x 2 for IV without success, will have another RN attempt.

## 2018-03-25 NOTE — Progress Notes (Signed)
Responded to PIV consult. RN states PIV obtained by MD. Consult cleared

## 2018-03-25 NOTE — H&P (Signed)
Patient Demographics:    Juan [name], is a 77 y.o. male  MRN: [id number]   DOB - May 13, 1941  Admit Date - 03/24/2018  Outpatient Primary MD for the patient is Colon Branch, MD   Assessment & Plan:    Principal Problem:   Acute lower UTI/H/o Pseudomonas UTI Active Problems:   Myelofibrosis (HCC)   BPH (benign prostatic hyperplasia)   CKD (chronic kidney disease) stage 3, GFR 30-59 ml/min (HCC)   Type II diabetes mellitus ([place])   Pulmonary hypertension severe   Myelodysplasia (myelodysplastic syndrome) (HCC)   HTN (hypertension)   Complicated UTI (urinary tract infection)  1)Presumed Pseudomonas UTI----patient with history of recurrent Pseudomonas UTIs, recent urine cultures from 03/09/2018 grew Pseudomonas aeruginosa resistant to quinolones with no options for oral agents/antibiotics, treat empirically with IV cefepime pending repeat urine culture results  2) suspected myelofibrosis--- oncologist Dr. Maylon Peppers, he was told that given his comorbid conditions bone marrow biopsy to confirm diagnosis would not be ideal at this time, he was also told that he is at risk for leukemia transformation, patient has chronic iron deficiency anemia gets Procrit from time to time  3)CKD III--stable, creatinine is currently 1.3 which is around baseline   4) BPH with LUTs--most likely contributing to #1 above, continue Flomax  5) HTN--stable, amlodipine has been discontinued due to worsening lower extremity edema, increase hydralazine to 100 mg 3 times daily, add isosorbide,  may use IV labetalol when necessary  Every 4 hours for systolic blood pressure over 160 mmhg  6)Generalized weakness and debility--- during recent hospitalization physical therapy recommended SNF rehab patient declined at this time will get PT eval again this  time  7))HFpEF--- patient with history of chronic diastolic dysfunction CHF, last known EF around 55%, patient with increasing lower extremity edema, no increased shortness of breath, chest x-ray without evidence of CHF, stop p.o. amlodipine as this could be contributing to his lower extremity edema, continue Lasix,  8)LE edema--- treat as above #7, venous Dopplers pending  With History of - Reviewed by me  Past Medical History:  Diagnosis Date  . Allergic rhinitis   . Anemia   . Ascending aortic aneurysm ([place]) 03/2014   4.3cm on CT scan  . CAD (coronary artery disease)    dx elsewheer in past, no documentation. Non-ischemic myoview 2007  . Chronic diastolic CHF (congestive heart failure), NYHA class 2 (HCC)    Normal EF w/ grade 1 dd by echo 12/2015  . Chronic respiratory failure with hypoxia/2L at home    "2L; all the time" (05/03/2017)  . CKD (chronic kidney disease), stage III ([place])   . Edema    R>L leg, u/s 5-12 neg for DVT  . Hemorrhoid   . History of hydronephrosis   . History of Splenic infarct   . History of thrombocytosis   . Hypertension   . Iron deficiency anemia due to chronic blood loss 03/31/2017  . 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 Migraine    "once/wk at least" (07/11/2013)  . Moderate to severe pulmonary hypertension ([place])   . Myeloproliferative disease ([place])   . Pyelonephritis 04/29/2017  . Recurrent Pseudomonas urinary tract infection   . Shortness of breath   . Sinus congestion   . Sleep apnea, obstructive    at some point used CPAP, was d/c  years ago  . Type II diabetes mellitus ([place])   . UTI (urinary tract infection) 06/2017  . UTI (urinary tract infection) 07/2017      Past Surgical History:  Procedure Laterality Date  . FLEXIBLE SIGMOIDOSCOPY N/A 06/08/2017   Procedure: FLEXIBLE SIGMOIDOSCOPY;  Surgeon: Carol Ada, MD;  Location: [facility];  Service: Endoscopy;  Laterality: N/A;  . IR FLUORO GUIDE CV LINE RIGHT  05/07/2017  . IR FLUORO  RM 30-60 MIN  05/07/2017  . IR US GUIDE VASC ACCESS RIGHT  05/07/2017  . TOE SURGERY Right    "tried to straighten out big toe" (07/11/2013)      Chief Complaint  Patient presents with  . Leg Swelling    bilateral      HPI:    Juan [name]  is a 77 y.o. male with medical history significant of allergic rhinitis, anemia, ascending aortic aneurysm, CAD, chronic diastolic CHF, chronic respiratory failure on home oxygen, stage III chronic kidney disease, chronic lower extremities edema, hemorrhoids, history of hydronephrosis, history of splenic infarct, history of thrombocytosis, hypertension, iron deficiency, migraine headaches, moderate to severe pulmonary hypertension, myeloproliferative disease, sleep apnea not on CPAP, type 2 diabetes, Recurrent Pseudomonas UTI who presents with urinary symptoms recent concerns about yet another episode of UTI---   Initially on presentation to the ED patient was complaining of lower abdominal discomfort and dysuria, however with further questioning he also started to complain of bilateral lower extremity swelling and discomfort in the lower extremities, no chest pains no pleuritic symptoms   Patient was recently admitted for Pseudomonas UTI, from 03/09/2018 to 03/15/2018, recent urine cultures from 03/09/2018 grew Pseudomonas aeruginosa resistant to quinolones with no options for oral agents/antibiotics  In ED--- UA suggestive of UTI and WBC is 11.9  Further questioning patient complains of worsening bilateral lower extremity edema with pain     Review of systems:    In addition to the HPI above,   A full Review of  Systems was done, all other systems reviewed are negative except as noted above in HPI , .    Social History:  Reviewed by me    Social History   Tobacco Use  . Smoking status: Former Smoker    Packs/day: 0.25    Years: 12.00    Pack years: 3.00    Types: Cigarettes    Last attempt to quit: 05/18/1966    Years since quitting:  51.8  . Smokeless tobacco: Never Used  . Tobacco comment: quit smoking 45 years ago  Substance Use Topics  . Alcohol use: No    Alcohol/week: 0.0 standard drinks       Family History :  Reviewed by me    Family History  Problem Relation Age of Onset  . Schizophrenia Son   . Mental illness Daughter   . Colon cancer Neg Hx   . Prostate cancer Neg Hx   . Heart attack Neg Hx   . Diabetes Neg Hx      Home Medications:   Prior to Admission medications   Medication Sig Start Date End Date Taking? Authorizing Provider  acetaminophen (TYLENOL) 325 MG tablet Take 2 tablets (650 mg total) by mouth every 6 (six) hours as needed for mild pain (or Fever >/= 101). 05/08/17  Yes Rai, Ripudeep K, MD  amLODipine (NORVASC) 5 MG tablet Take 1 tablet (5 mg total) by mouth daily. For BP 02/09/18  Yes Bensimhon, Shaune Pascal, MD  aspirin EC 325 MG tablet Take 325 mg by mouth daily.    Yes [provider]  b complex vitamins tablet Take 1 tablet by mouth daily.   Yes [provider]  benzonatate (TESSALON) 100 MG capsule Take 1 capsule (100 mg total) by mouth 3 (three) times daily as needed for cough. 03/17/18  Yes Paz, Alda Berthold, MD  clotrimazole (LOTRIMIN AF) 1 % cream Apply to Lt foot (between) Toes bid for athletes foot Patient taking differently: Apply 1 application topically 2 (two) times daily. Apply to left foot (between) toes twice daily for athletes foot. 09/12/17  Yes Catheline Hixon, MD  ferrous gluconate (FERGON) 240 (27 FE) MG tablet Take 240 mg by mouth daily.   Yes [provider]  furosemide (LASIX) 40 MG tablet Take 80 mg (2 tab) in morning and 19m (1tab) in evening Patient taking differently: Take 40-80 mg by mouth See admin instructions. Take 80 mg (2 tablets) by mouth in morning and 450m(1 tablet) in evening. 02/09/18  Yes Bensimhon, DaShaune PascalMD  hydrALAZINE (APRESOLINE) 50 MG tablet Take 1.5 tablets (75 mg total) by mouth 3 (three) times daily. 02/09/18  Yes  Bensimhon, DaShaune PascalMD  miconazole (MICOTIN) 2 % powder Apply 1 application topically as needed for itching.    Yes [provider]  mouth rinse LIQD solution 15 mLs by Mouth Rinse route 2 (two) times daily. 09/26/17  Yes AdShelly CossMD  ondansetron (ZOFRAN) 4 MG tablet Take 1 tablet (4 mg total) by mouth every 6 (six) hours as needed for nausea or vomiting. 09/12/17  Yes Young Mulvey, MD  OXYGEN Inhale 2 L into the lungs continuous.   Yes [provider]  tamsulosin (FLOMAX) 0.4 MG CAPS capsule Take 1 capsule (0.4 mg total) by mouth daily after supper. 06/13/17  Yes KrBonnielee HaffMD     Allergies:    No Known Allergies   Physical Exam:   Vitals  Blood pressure (!) 145/70, pulse 72, resp. rate 16, SpO2 97 %.  Physical Examination: General appearance - alert, well appearing, and in no distress  Mental status - alert, oriented to person, place, and time,  Eyes - sclera anicteric Nose- [place] 2L/min Neck - supple, no JVD elevation , Chest - clear  to auscultation bilaterally, symmetrical air movement, no increased work of breathing Heart - S1 and S2 normal, good Abdomen - soft, nontender, nondistended, no CVA area tenderness  neurological - screening mental status exam normal, neck supple without rigidity, cranial nerves II through XII intact, DTR's normal and symmetric, generalized weakness without new focal deficits Extremities - 3 Plus pitting leg and pedal edema noted, intact peripheral pulses  Skin - warm, dry     Data Review:    CBC Recent Labs  Lab 03/25/18 0035 03/25/18 0143  WBC  --  11.9*  HGB 8.8* 8.1*  HCT 26.0* 27.5*  PLT  --  163  MCV  --  86.2  MCH  --  25.4*  MCHC  --  29.5*  RDW  --  17.8*  LYMPHSABS  --  4.3*  MONOABS  --  0.9  EOSABS  --  0.2  BASOSABS  --  0.4*   ------------------------------------------------------------------------------------------------------------------  Chemistries  Recent Labs  Lab 03/25/18 0025  03/25/18 0035  NA 139 138  K 5.2* 5.1  CL 107 107  CO2 23  --   GLUCOSE 98 96  BUN 54* 67*  CREATININE 1.35* 1.50*  CALCIUM 9.2  --    ------------------------------------------------------------------------------------------------------------------ CrCl cannot be calculated (Unknown ideal weight.). ------------------------------------------------------------------------------------------------------------------ No results for input(s): TSH, T4TOTAL, T3FREE, THYROIDAB in the last 72 hours.  Invalid input(s): FREET3   Coagulation profile No results for input(s): INR, PROTIME in the last 168 hours. ------------------------------------------------------------------------------------------------------------------- No results for input(s): DDIMER in the last 72 hours. -------------------------------------------------------------------------------------------------------------------  Cardiac Enzymes No results for input(s): CKMB, TROPONINI, MYOGLOBIN in the last 168 hours.  Invalid input(s): CK ------------------------------------------------------------------------------------------------------------------    Component Value Date/Time   BNP 193.7 (H) 03/09/2018 1450   ---------------------------------------------------------------------------------------------------------------  Urinalysis    Component Value Date/Time   COLORURINE YELLOW 03/24/2018 2333   APPEARANCEUR HAZY (A) 03/24/2018 2333   LABSPEC 1.011 03/24/2018 2333   PHURINE 5.0 03/24/2018 2333   GLUCOSEU NEGATIVE 03/24/2018 2333   GLUCOSEU NEGATIVE 05/02/2015 1008   HGBUR NEGATIVE 03/24/2018 2333   BILIRUBINUR NEGATIVE 03/24/2018 2333   KETONESUR NEGATIVE 03/24/2018 2333   PROTEINUR 100 (A) 03/24/2018 2333   UROBILINOGEN 0.2 05/02/2015 1008   NITRITE POSITIVE (A) 03/24/2018 2333   LEUKOCYTESUR LARGE (A) 03/24/2018 2333    ----------------------------------------------------------------------------------------------------------------   Imaging Results:    Dg Chest 2 View  Result Date: 03/24/2018 CLINICAL DATA:  Shortness of breath EXAM: CHEST - 2 VIEW COMPARISON:  03/09/2018, 09/05/2017, 01/23/2017 FINDINGS: Cardiomegaly with vascular congestion and mild diffuse interstitial and ground-glass opacity. No pleural effusion. No pneumothorax. Slight increased atelectasis at the left base. Aortic atherosclerosis. Heterogenous sclerosis within the osseous structures as before. IMPRESSION: 1. Slight increased atelectasis at the left base. 2. Otherwise similar appearance of diffuse bilateral interstitial and ground-glass opacity as compared with multiple prior exams and presumably chronic. 3. Cardiomegaly Electronically Signed   By: Donavan Foil M.D.   On: 03/24/2018 23:59    Radiological Exams on Admission: Dg Chest 2 View  Result Date: 03/24/2018 CLINICAL DATA:  Shortness of breath EXAM: CHEST - 2 VIEW COMPARISON:  03/09/2018, 09/05/2017, 01/23/2017 FINDINGS: Cardiomegaly with vascular congestion and mild diffuse interstitial and ground-glass opacity. No pleural effusion. No pneumothorax. Slight increased atelectasis at the left base. Aortic atherosclerosis. Heterogenous sclerosis within the osseous structures as before. IMPRESSION: 1. Slight increased atelectasis at the left base. 2. Otherwise similar appearance of diffuse bilateral interstitial and ground-glass opacity as compared with multiple prior exams and presumably chronic. 3. Cardiomegaly Electronically Signed   By: Donavan Foil M.D.   On: 03/24/2018 23:59    DVT Prophylaxis -SCD /heparin AM Labs Ordered, also please review Full Orders  Family Communication: Admission, patients condition and plan of care including tests being ordered have been discussed with the patient who indicate understanding and agree with the plan   Code Status - Full Code  Likely  DC to  SNF  Condition   stable  Roxan Hockey M.D on 03/25/2018 at 3:02 AM Pager---[phone] Go to [email or web address] - [email or web address] for contact info  [facility] - Office  [phone]

## 2018-03-25 NOTE — Progress Notes (Signed)
*  Preliminary Results*  Bilateral lower extremity venous duplex completed. Bilateral lower extremities are negative for deep vein thrombosis. There is no evidence of Baker's cyst bilaterally.  03/25/2018 1:37 PM Juan [name] Dawna Part

## 2018-03-26 ENCOUNTER — Inpatient Hospital Stay (HOSPITAL_COMMUNITY): Payer: Medicare Other

## 2018-03-26 DIAGNOSIS — N183 Chronic kidney disease, stage 3 (moderate): Secondary | ICD-10-CM

## 2018-03-26 DIAGNOSIS — D7581 Myelofibrosis: Secondary | ICD-10-CM

## 2018-03-26 DIAGNOSIS — R609 Edema, unspecified: Secondary | ICD-10-CM

## 2018-03-26 DIAGNOSIS — E1142 Type 2 diabetes mellitus with diabetic polyneuropathy: Secondary | ICD-10-CM

## 2018-03-26 DIAGNOSIS — Z794 Long term (current) use of insulin: Secondary | ICD-10-CM

## 2018-03-26 DIAGNOSIS — I10 Essential (primary) hypertension: Secondary | ICD-10-CM

## 2018-03-26 MED ORDER — HYDROCORTISONE 1 % EX CREA
TOPICAL_CREAM | Freq: Four times a day (QID) | CUTANEOUS | Status: DC
  Administered 2018-03-26 – 2018-03-27 (×7): via TOPICAL
  Administered 2018-03-27: 1 via TOPICAL
  Administered 2018-03-28 – 2018-04-02 (×16): via TOPICAL
  Filled 2018-03-26 (×4): qty 28

## 2018-03-26 MED ORDER — COLCHICINE 0.6 MG PO TABS
0.6000 mg | ORAL_TABLET | Freq: Two times a day (BID) | ORAL | Status: DC
  Administered 2018-03-26 – 2018-04-02 (×15): 0.6 mg via ORAL
  Filled 2018-03-26 (×15): qty 1

## 2018-03-26 MED ORDER — OXYCODONE HCL 5 MG PO TABS
10.0000 mg | ORAL_TABLET | ORAL | Status: DC | PRN
  Administered 2018-03-26 – 2018-03-31 (×6): 10 mg via ORAL
  Filled 2018-03-26 (×6): qty 2

## 2018-03-26 NOTE — Progress Notes (Signed)
PROGRESS NOTE    Juan [name]  SAY:[id number] DOB: Jan 27, 1941 DOA: 03/24/2018 PCP: Colon Branch, MD    Brief Narrative:   Juan [name]  is a 77 y.o. malewith medical history significant ofallergic rhinitis, anemia, ascending aortic aneurysm, CAD, chronic diastolic CHF, chronic respiratory failure on home oxygen, stage III chronic kidney disease, chronic lower extremities edema, hemorrhoids, history of hydronephrosis, history of splenic infarct, history of thrombocytosis, hypertension, iron deficiency, migraine headaches, moderate to severe pulmonary hypertension, myeloproliferative disease, sleep apnea not on CPAP, type 2 diabetes, Recurrent Pseudomonas UTI who presents with urinary symptoms recent concerns about yet another episode of UTI.    Assessment & Plan:   Principal Problem:   Acute lower UTI/H/o Pseudomonas UTI Active Problems:   Myelofibrosis (HCC)   BPH (benign prostatic hyperplasia)   CKD (chronic kidney disease) stage 3, GFR 30-59 ml/min (HCC)   Type II diabetes mellitus (HCC)   Pulmonary hypertension severe   Myelodysplasia (myelodysplastic syndrome) (HCC)   HTN (hypertension)   Complicated UTI (urinary tract infection)   Pseudomonas urinary tract infection Urinary cultures show 100,000 gram-negative rods Continue with IV cefepime empirically and follow the cultures.    Gout flareup of the second metacarpophalangeal joint Started the patient on colchicine twice daily increase the pain medication.  X-rays show heart arthropathy in the second metacarpophalangeal joints.   Stage 3 CKD creatinine at 1.3 which is around his baseline   BPH contributing to his UTI   Hypertension Better controlled today resume home medication and use as needed hydralazine.   Chronic diastolic heart failure Patient appears to be compensated.  He denies any shortness of breath chest x-ray does not show any evidence of CHF.  Lower extremity edema probably secondary to amlodipine.  Venous  duplex negative for DVT.   Type 2 diabetes mellitus CBG (last 3)  No results for input(s): GLUCAP in the last 72 hours.   Suspected myelofibrosis recommend follow-up with oncologist as outpatient.  DVT prophylaxis: ( Code Status: Full code Family Communication: None at bedside discussed the plan and treatment with the patient) Disposition Plan: Pending clinical improvement   Consultants:   None  Procedures: X-rays of the right hand Antimicrobials: IV cefepime since admission  Subjective: Patient reports pain in the right hand the second metacarpal pharyngeal joint with surrounding area of cellulitis  Objective: Vitals:   03/25/18 1410 03/25/18 2124 03/26/18 0627 03/26/18 1038  BP: 138/74 (!) 163/74 (!) 155/75 (!) 154/69  Pulse: 72 77 80   Resp: 17 18 16    Temp: 97.6 F (36.4 C)     TempSrc: Oral     SpO2: 91% 95% 93%     Intake/Output Summary (Last 24 hours) at 03/26/2018 1158 Last data filed at 03/26/2018 1030 Gross per 24 hour  Intake 1061.44 ml  Output 3000 ml  Net -1938.56 ml   There were no vitals filed for this visit.  Examination:  General exam: Appears calm and comfortable  Respiratory system: Clear to auscultation. Respiratory effort normal. Cardiovascular system: S1 & S2 heard, RRR. No JVD, murmurs, 2+ pedal edema Gastrointestinal system: Abdomen is nondistended, soft and nontender. . Normal bowel sounds heard. Central nervous system: Alert and oriented. No focal neurological deficits. Extremities: Right hand second metacarpophalangeal joint swelling with surrounding area of erythema and warmth and tenderness. Skin: No rashes, lesions or ulcers Psychiatry: . Mood & affect appropriate.     Data Reviewed: I have personally reviewed following labs and imaging studies  CBC: Recent Labs  Lab  03/25/18 0035 03/25/18 0143 03/25/18 0825  WBC  --  11.9* 10.7*  NEUTROABS  --  3.4  --   HGB 8.8* 8.1* 7.9*  HCT 26.0* 27.5* 26.9*  MCV  --  86.2 85.1    PLT  --  163 332   Basic Metabolic Panel: Recent Labs  Lab 03/25/18 0025 03/25/18 0035 03/25/18 0825  NA 139 138 141  K 5.2* 5.1 4.0  CL 107 107 108  CO2 23  --  25  GLUCOSE 98 96 100*  BUN 54* 67* 52*  CREATININE 1.35* 1.50* 1.33*  CALCIUM 9.2  --  9.4   GFR: CrCl cannot be calculated (Unknown ideal weight.). Liver Function Tests: No results for input(s): AST, ALT, ALKPHOS, BILITOT, PROT, ALBUMIN in the last 168 hours. No results for input(s): LIPASE, AMYLASE in the last 168 hours. No results for input(s): AMMONIA in the last 168 hours. Coagulation Profile: No results for input(s): INR, PROTIME in the last 168 hours. Cardiac Enzymes: No results for input(s): CKTOTAL, CKMB, CKMBINDEX, TROPONINI in the last 168 hours. BNP (last 3 results) No results for input(s): PROBNP in the last 8760 hours. HbA1C: No results for input(s): HGBA1C in the last 72 hours. CBG: No results for input(s): GLUCAP in the last 168 hours. Lipid Profile: No results for input(s): CHOL, HDL, LDLCALC, TRIG, CHOLHDL, LDLDIRECT in the last 72 hours. Thyroid Function Tests: No results for input(s): TSH, T4TOTAL, FREET4, T3FREE, THYROIDAB in the last 72 hours. Anemia Panel: No results for input(s): VITAMINB12, FOLATE, FERRITIN, TIBC, IRON, RETICCTPCT in the last 72 hours. Sepsis Labs: No results for input(s): PROCALCITON, LATICACIDVEN in the last 168 hours.  Recent Results (from the past 240 hour(s))  Urine culture     Status: Abnormal (Preliminary result)   Collection Time: 03/25/18 12:25 AM  Result Value Ref Range Status   Specimen Description URINE, CLEAN CATCH  Final   Special Requests NONE  Final   Culture (A)  Final    >=100,000 COLONIES/mL GRAM NEGATIVE RODS CULTURE REINCUBATED FOR BETTER GROWTH Performed at [facility], [address] [place], [place] [address]    Report Status PENDING  Incomplete         Radiology Studies: Dg Chest 2 View  Result Date:  03/24/2018 CLINICAL DATA:  Shortness of breath EXAM: CHEST - 2 VIEW COMPARISON:  03/09/2018, 09/05/2017, 01/23/2017 FINDINGS: Cardiomegaly with vascular congestion and mild diffuse interstitial and ground-glass opacity. No pleural effusion. No pneumothorax. Slight increased atelectasis at the left base. Aortic atherosclerosis. Heterogenous sclerosis within the osseous structures as before. IMPRESSION: 1. Slight increased atelectasis at the left base. 2. Otherwise similar appearance of diffuse bilateral interstitial and ground-glass opacity as compared with multiple prior exams and presumably chronic. 3. Cardiomegaly Electronically Signed   By: Donavan Foil M.D.   On: 03/24/2018 23:59   Vas [place] Lower Extremity Venous (dvt)  Result Date: 03/25/2018  Lower Venous Study Indications: Swelling, and Edema.  Performing Technologist: Abram Sander  Examination Guidelines: A complete evaluation includes B-mode imaging, spectral Doppler, color Doppler, and power Doppler as needed of all accessible portions of each vessel. Bilateral testing is considered an integral part of a complete examination. Limited examinations for reoccurring indications may be performed as noted.  Right Venous Findings: +---------+---------------+---------+-----------+----------+-------+          CompressibilityPhasicitySpontaneityPropertiesSummary +---------+---------------+---------+-----------+----------+-------+ CFV      Full           Yes      Yes                          +---------+---------------+---------+-----------+----------+-------+  SFJ      Full                                                 +---------+---------------+---------+-----------+----------+-------+ FV Prox  Full                                                 +---------+---------------+---------+-----------+----------+-------+ FV Mid   Full                                                  +---------+---------------+---------+-----------+----------+-------+ FV DistalFull                                                 +---------+---------------+---------+-----------+----------+-------+ PFV      Full                                                 +---------+---------------+---------+-----------+----------+-------+ POP      Full           Yes      Yes                          +---------+---------------+---------+-----------+----------+-------+ PTV      Full                                                 +---------+---------------+---------+-----------+----------+-------+ PERO     Full                                                 +---------+---------------+---------+-----------+----------+-------+  Left Venous Findings: +---------+---------------+---------+-----------+----------+-------+          CompressibilityPhasicitySpontaneityPropertiesSummary +---------+---------------+---------+-----------+----------+-------+ CFV      Full           Yes      Yes                          +---------+---------------+---------+-----------+----------+-------+ SFJ      Full                                                 +---------+---------------+---------+-----------+----------+-------+ FV Prox  Full                                                 +---------+---------------+---------+-----------+----------+-------+  FV Mid   Full                                                 +---------+---------------+---------+-----------+----------+-------+ FV DistalFull                                                 +---------+---------------+---------+-----------+----------+-------+ PFV      Full                                                 +---------+---------------+---------+-----------+----------+-------+ POP      Full           Yes      Yes                           +---------+---------------+---------+-----------+----------+-------+ PTV      Full                                                 +---------+---------------+---------+-----------+----------+-------+ PERO     Full                                                 +---------+---------------+---------+-----------+----------+-------+    Summary: Right: There is no evidence of deep vein thrombosis in the lower extremity. No cystic structure found in the popliteal fossa. Left: There is no evidence of deep vein thrombosis in the lower extremity. No cystic structure found in the popliteal fossa.  *See table(s) above for measurements and observations. Electronically signed by Monica Martinez MD on 03/25/2018 at 5:01:00 PM.    Final         Scheduled Meds: . aspirin EC  325 mg Oral Daily  . B-complex with vitamin C  1 tablet Oral Daily  . colchicine  0.6 mg Oral BID  . ferrous gluconate  324 mg Oral Q breakfast  . furosemide  40 mg Oral Daily  . heparin  5,000 Units Subcutaneous Q8H  . hydrALAZINE  100 mg Oral Q8H  . hydrocortisone cream   Topical QID  . isosorbide mononitrate  30 mg Oral Daily  . mouth rinse  15 mL Mouth Rinse BID  . sodium chloride flush  3 mL Intravenous Q12H  . tamsulosin  0.4 mg Oral QPC supper   Continuous Infusions: . sodium chloride 250 mL (03/25/18 2133)  . ceFEPime (MAXIPIME) IV 1 g (03/26/18 0723)     LOS: 1 day    Time spent: [place], MD [facility] Pager [phone]  If 7PM-7AM, please contact night-coverage [email or web address] Password [email or web address] 03/26/2018, 11:58 AM

## 2018-03-26 NOTE — Progress Notes (Addendum)
PT Cancellation Note  Patient Details Name: Juan [name] MRN: [id number] DOB: 1940-10-31   Cancelled Treatment:    Reason Eval/Treat Not Completed: Medical issues which prohibited therapy. Pt reports he has been sick all night and is not feeling well. Declining participation in PT eval at this time. PT to re-attempt as time allows.   Addendum 1316: Pt sleeping upon re-attempt in PM. PT to follow up tomorrow.  Lorriane Shire 03/26/2018, 10:19 AM   Lorrin Goodell, PT  Office # [phone] Pager [phone]

## 2018-03-26 NOTE — Progress Notes (Signed)
Called facility x2 to give report, asked me to call back again

## 2018-03-27 LAB — BASIC METABOLIC PANEL
ANION GAP: 7 (ref 5–15)
BUN: 36 mg/dL — ABNORMAL HIGH (ref 8–23)
CALCIUM: 9.3 mg/dL (ref 8.9–10.3)
CO2: 23 mmol/L (ref 22–32)
Chloride: 108 mmol/L (ref 98–111)
Creatinine, Ser: 1.21 mg/dL (ref 0.61–1.24)
GFR, EST NON AFRICAN AMERICAN: 56 mL/min — AB (ref >60)
Glucose, Bld: 133 mg/dL — ABNORMAL HIGH (ref 70–99)
POTASSIUM: 4.2 mmol/L (ref 3.5–5.1)
Sodium: 138 mmol/L (ref 135–145)

## 2018-03-27 LAB — CBC
HEMATOCRIT: 26.3 % — AB (ref 39.0–52.0)
Hemoglobin: 7.8 g/dL — ABNORMAL LOW (ref 13.0–17.0)
MCH: 25.3 pg — ABNORMAL LOW (ref 26.0–34.0)
MCHC: 29.7 g/dL — ABNORMAL LOW (ref 30.0–36.0)
MCV: 85.4 fL (ref 80.0–100.0)
NRBC: 4.9 % — AB (ref 0.0–0.2)
PLATELETS: 142 10*3/uL — AB (ref 150–400)
RBC: 3.08 MIL/uL — ABNORMAL LOW (ref 4.22–5.81)
RDW: 17.8 % — AB (ref 11.5–15.5)
WBC: 16 10*3/uL — ABNORMAL HIGH (ref 4.0–10.5)

## 2018-03-27 LAB — URINE CULTURE: Culture: >=100000 — AB

## 2018-03-27 NOTE — Plan of Care (Signed)
  Problem: Education: Goal: Knowledge of General Education information will improve Description Including pain rating scale, medication(s)/side effects and non-pharmacologic comfort measures Outcome: Progressing   Problem: Health Behavior/Discharge Planning: Goal: Ability to manage health-related needs will improve Outcome: Progressing   Problem: Clinical Measurements: Goal: Will remain free from infection Outcome: Progressing Goal: Diagnostic test results will improve Outcome: Progressing   Problem: Activity: Goal: Risk for activity intolerance will decrease Outcome: Progressing   Problem: Nutrition: Goal: Adequate nutrition will be maintained Outcome: Progressing   Problem: Coping: Goal: Level of anxiety will decrease Outcome: Progressing   Problem: Elimination: Goal: Will not experience complications related to bowel motility Outcome: Progressing   Problem: Pain Managment: Goal: General experience of comfort will improve Outcome: Progressing   Problem: Safety: Goal: Ability to remain free from injury will improve Outcome: Progressing   Problem: Skin Integrity: Goal: Risk for impaired skin integrity will decrease Outcome: Progressing

## 2018-03-27 NOTE — Progress Notes (Signed)
PROGRESS NOTE    Juan [name]  EVO:[id number] DOB: [birth year] DOA: 03/24/2018 PCP: Colon Branch, MD    Brief Narrative:   Juan [name]  is a 77 y.o. malewith medical history significant ofallergic rhinitis, anemia, ascending aortic aneurysm, CAD, chronic diastolic CHF, chronic respiratory failure on home oxygen, stage III chronic kidney disease, chronic lower extremities edema, hemorrhoids, history of hydronephrosis, history of splenic infarct, history of thrombocytosis, hypertension, iron deficiency, migraine headaches, moderate to severe pulmonary hypertension, myeloproliferative disease, sleep apnea not on CPAP, type 2 diabetes, Recurrent Pseudomonas UTI who presents with urinary symptoms recent concerns about yet another episode of UTI.    Assessment & Plan:   Principal Problem:   Acute lower UTI/H/o Pseudomonas UTI Active Problems:   Myelofibrosis (HCC)   BPH (benign prostatic hyperplasia)   CKD (chronic kidney disease) stage 3, GFR 30-59 ml/min (HCC)   Type II diabetes mellitus (HCC)   Pulmonary hypertension severe   Myelodysplasia (myelodysplastic syndrome) (HCC)   HTN (hypertension)   Complicated UTI (urinary tract infection)   Pseudomonas urinary tract infection Urinary cultures show 100,000 Pseudomonas aeruginosa sensitive to cefepime Continue with IV cefepime, blood cultures have been negative so far.    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  X-rays show  arthropathy in the second metacarpophalangeal joints.   Stage 3 CKD creatinine improved to 1.2    BPH contributing to his UTI   Hypertension Optimally controlled, added PRN hydralazine   Chronic diastolic heart failure Patient appears to be compensated.  He denies any shortness of breath chest x-ray does not show any evidence of CHF.  Lower extremity edema probably secondary to amlodipine.  Venous duplex negative for  DVT.   Type 2 diabetes mellitus CBG (last 3)  No results for input(s): GLUCAP in the last 72 hours.   Suspected myelofibrosis recommend follow-up with oncologist as outpatient.   Leukocytosis probably from gout flareup.    Anemia of chronic disease hemoglobin at 7.8.  No signs of overt bleeding.  Transfuse to keep hemoglobin greater than 7.  DVT prophylaxis: SCDs Code Status: Full code Family Communication: None at bedside discussed the plan and treatment with the patient) Disposition Plan: Pending clinical improvement   Consultants:   None  Procedures: X-rays of the right hand Antimicrobials: IV cefepime since admission  Subjective: Patient reports the pain in the hand is getting better but not completely resolved if pain, swelling, erythema does not get better, we will add steroids from tomorrow.  Objective: Vitals:   03/26/18 1038 03/26/18 2137 03/27/18 0314 03/27/18 0559  BP: (!) 154/69 (!) 159/80 (!) 170/77 (!) 172/82  Pulse:  82 85   Resp:  19 19   Temp:  98.6 F (37 C) 99.9 F (37.7 C)   TempSrc:  Axillary Oral   SpO2:  92% 95% 93%    Intake/Output Summary (Last 24 hours) at 03/27/2018 1633 Last data filed at 03/27/2018 0315 Gross per 24 hour  Intake 823.11 ml  Output 100 ml  Net 723.11 ml   There were no vitals filed for this visit.  Examination:  General exam: Not in any kind of distress Respiratory system: Clear to auscultation bilaterally, no wheezing or rhonchi Cardiovascular system: S1 & S2 heard, RRR. No JVD, murmurs, 2+ pedal edema Gastrointestinal system: Abdomen is soft, nontender, nondistended.  With good bowel sounds Central nervous system: Alert and oriented. No focal neurological deficits. Extremities: Right hand second metacarpophalangeal  joint swelling with surrounding area of erythema and warmth and tenderness. Skin: No rashes, lesions or ulcers Psychiatry: . Mood & affect appropriate.     Data Reviewed: I have personally  reviewed following labs and imaging studies  CBC: Recent Labs  Lab 03/25/18 0035 03/25/18 0143 03/25/18 0825 03/27/18 0223  WBC  --  11.9* 10.7* 16.0*  NEUTROABS  --  3.4  --   --   HGB 8.8* 8.1* 7.9* 7.8*  HCT 26.0* 27.5* 26.9* 26.3*  MCV  --  86.2 85.1 85.4  PLT  --  163 159 101*   Basic Metabolic Panel: Recent Labs  Lab 03/25/18 0025 03/25/18 0035 03/25/18 0825 03/27/18 0223  NA 139 138 141 138  K 5.2* 5.1 4.0 4.2  CL 107 107 108 108  CO2 23  --  25 23  GLUCOSE 98 96 100* 133*  BUN 54* 67* 52* 36*  CREATININE 1.35* 1.50* 1.33* 1.21  CALCIUM 9.2  --  9.4 9.3   GFR: CrCl cannot be calculated (Unknown ideal weight.). Liver Function Tests: No results for input(s): AST, ALT, ALKPHOS, BILITOT, PROT, ALBUMIN in the last 168 hours. No results for input(s): LIPASE, AMYLASE in the last 168 hours. No results for input(s): AMMONIA in the last 168 hours. Coagulation Profile: No results for input(s): INR, PROTIME in the last 168 hours. Cardiac Enzymes: No results for input(s): CKTOTAL, CKMB, CKMBINDEX, TROPONINI in the last 168 hours. BNP (last 3 results) No results for input(s): PROBNP in the last 8760 hours. HbA1C: No results for input(s): HGBA1C in the last 72 hours. CBG: No results for input(s): GLUCAP in the last 168 hours. Lipid Profile: No results for input(s): CHOL, HDL, LDLCALC, TRIG, CHOLHDL, LDLDIRECT in the last 72 hours. Thyroid Function Tests: No results for input(s): TSH, T4TOTAL, FREET4, T3FREE, THYROIDAB in the last 72 hours. Anemia Panel: No results for input(s): VITAMINB12, FOLATE, FERRITIN, TIBC, IRON, RETICCTPCT in the last 72 hours. Sepsis Labs: No results for input(s): PROCALCITON, LATICACIDVEN in the last 168 hours.  Recent Results (from the past 240 hour(s))  Urine culture     Status: Abnormal   Collection Time: 03/25/18 12:25 AM  Result Value Ref Range Status   Specimen Description URINE, CLEAN CATCH  Final   Special Requests   Final     NONE Performed at [facility], [address], [place], [place] [address]    Culture >=100,000 COLONIES/mL PSEUDOMONAS AERUGINOSA (A)  Final   Report Status 03/27/2018 FINAL  Final   Organism ID, Bacteria PSEUDOMONAS AERUGINOSA (A)  Final      Susceptibility   Pseudomonas aeruginosa - MIC*    CEFTAZIDIME 4 SENSITIVE Sensitive     CIPROFLOXACIN >=4 RESISTANT Resistant     GENTAMICIN 4 SENSITIVE Sensitive     IMIPENEM 2 SENSITIVE Sensitive     PIP/TAZO 8 SENSITIVE Sensitive     CEFEPIME 8 SENSITIVE Sensitive     * >=100,000 COLONIES/mL PSEUDOMONAS AERUGINOSA  Culture, blood (Routine X 2) w Reflex to ID Panel     Status: None (Preliminary result)   Collection Time: 03/25/18  5:53 AM  Result Value Ref Range Status   Specimen Description BLOOD RIGHT ARM  Final   Special Requests   Final    BOTTLES DRAWN AEROBIC ONLY Blood Culture results may not be optimal due to an inadequate volume of blood received in culture bottles   Culture   Final    NO GROWTH 2 DAYS Performed at [facility]  Lab, [address] [place], [place] [address]    Report Status PENDING  Incomplete         Radiology Studies: Dg Hand Complete Right  Result Date: 03/26/2018 CLINICAL DATA:  Right hand pain. EXAM: RIGHT HAND - COMPLETE 3+ VIEW COMPARISON:  None. FINDINGS: No fracture. No bone lesion. Skeletal structures are demineralized. Second metacarpal phalangeal joint space narrowing with mild spurring from the base of the second metacarpal head. Remaining joints normally spaced and aligned. There is soft tissue swelling noted at the base of the index finger. IMPRESSION: 1. No fracture or dislocation. 2. Arthropathic changes at the second metacarpophalangeal joint associated with soft tissue swelling that extends to the proximal index finger. Electronically Signed   By: Lajean Manes M.D.   On: 03/26/2018 16:10        Scheduled Meds: . aspirin EC  325 mg Oral Daily  . B-complex with vitamin C  1  tablet Oral Daily  . colchicine  0.6 mg Oral BID  . ferrous gluconate  324 mg Oral Q breakfast  . furosemide  40 mg Oral Daily  . heparin  5,000 Units Subcutaneous Q8H  . hydrALAZINE  100 mg Oral Q8H  . hydrocortisone cream   Topical QID  . isosorbide mononitrate  30 mg Oral Daily  . mouth rinse  15 mL Mouth Rinse BID  . sodium chloride flush  3 mL Intravenous Q12H  . tamsulosin  0.4 mg Oral QPC supper   Continuous Infusions: . sodium chloride 250 mL (03/25/18 2133)  . ceFEPime (MAXIPIME) IV 1 g (03/27/18 0605)     LOS: 2 days    Time spent: [place], MD [facility] Pager [phone]  If 7PM-7AM, please contact night-coverage [email or web address] Password [facility] 03/27/2018, 4:33 PM

## 2018-03-27 NOTE — Evaluation (Signed)
Physical Therapy Evaluation Patient Details Name: Juan [name] MRN: [id number] DOB: 23-May-1940 Today's Date: 03/27/2018   History of Present Illness  Pt is a 77 y/o male admitted secondary to UTI, also has gout flare up of second metacarpophalangeal joint. PMH including but not limited to CHF, CKD, CAD, AAA, DM, HTN and chronic respiratory failure (on home O2).  Clinical Impression  Pt admitted with above diagnosis. Pt currently with functional limitations due to the deficits listed below (see PT Problem List). Prior to admission, patient states he was ambulatory with RW and independent with ADL's. Patient is very self limiting and particular about session. Refusing use of walker and adamantly demanding second person assistance to "hold his right hand," due to gout pain. Performing all transfers at min assist level. Presents with poor safety awareness/judgment, balance, and weakness. Pt will benefit from skilled PT to increase their independence and safety with mobility to allow discharge to the venue listed below.       Follow Up Recommendations SNF    Equipment Recommendations  None recommended by PT    Recommendations for Other Services       Precautions / Restrictions Precautions Precautions: Fall Restrictions Weight Bearing Restrictions: No      Mobility  Bed Mobility Overal bed mobility: Needs Assistance Bed Mobility: Supine to Sit     Supine to sit: Min assist     General bed mobility comments: increased time, min assist to elevae trunk. Pt able to scoot hips forward with increased effort  Transfers Overall transfer level: Needs assistance Equipment used: 2 person hand held assist Transfers: Sit to/from Stand;Stand Pivot Transfers Sit to Stand: Min assist;From elevated surface;+2 safety/equipment Stand pivot transfers: Min assist;+2 safety/equipment;From elevated surface       General transfer comment: Bed in excessively elevated position per pt request/demand. Pt  would not hold onto walker with right hand due to pain. Adamantly demanding 2nd person assistance to "hold right hand" at elbow and forearm. RN asked to assist. Requiring min assist for both transfers. Increased trunk flexion throughout  Ambulation/Gait                Stairs            Wheelchair Mobility    Modified Rankin (Stroke Patients Only)       Balance Overall balance assessment: Needs assistance Sitting-balance support: Feet supported Sitting balance-Leahy Scale: Fair     Standing balance support: Single extremity supported;During functional activity Standing balance-Leahy Scale: Poor                               Pertinent Vitals/Pain Pain Assessment: Faces Faces Pain Scale: Hurts even more Pain Location: R hand (gout flareup) Pain Descriptors / Indicators: Discomfort;Grimacing;Aching Pain Intervention(s): Repositioned;Monitored during session    Home Living Family/patient expects to be discharged to:: Private residence Living Arrangements: Children Available Help at Discharge: Family Type of Home: Apartment Home Access: Level entry     Home Layout: One level Home Equipment: Environmental consultant - 2 wheels;Cane - single point;Bedside commode;Wheelchair - manual;Hospital bed      Prior Function Level of Independence: Independent with assistive device(s)         Comments: per report ambulates with RW and is independent with ADL's     Hand Dominance   Dominant Hand: Right    Extremity/Trunk Assessment   Upper Extremity Assessment Upper Extremity Assessment: Generalized weakness    Lower Extremity Assessment Lower  Extremity Assessment: Generalized weakness       Communication   Communication: No difficulties  Cognition Arousal/Alertness: Awake/alert Behavior During Therapy: Flat affect Overall Cognitive Status: Impaired/Different from baseline Area of Impairment: Safety/judgement                          Safety/Judgement: Decreased awareness of deficits;Decreased awareness of safety     General Comments: pt is very particular about everything      General Comments      Exercises     Assessment/Plan    PT Assessment Patient needs continued PT services  PT Problem List Decreased strength;Decreased activity tolerance;Decreased balance;Decreased mobility;Decreased coordination;Decreased safety awareness       PT Treatment Interventions DME instruction;Gait training;Stair training;Functional mobility training;Therapeutic exercise;Therapeutic activities;Balance training;Neuromuscular re-education;Patient/family education    PT Goals (Current goals can be found in the Care Plan section)  Acute Rehab PT Goals Patient Stated Goal: "to get shaved." PT Goal Formulation: With patient Time For Goal Achievement: 04/10/18 Potential to Achieve Goals: Fair    Frequency Min 3X/week   Barriers to discharge        Co-evaluation               AM-PAC PT "6 Clicks" Daily Activity  Outcome Measure Difficulty turning over in bed (including adjusting bedclothes, sheets and blankets)?: A Little Difficulty moving from lying on back to sitting on the side of the bed? : Unable Difficulty sitting down on and standing up from a chair with arms (e.g., wheelchair, bedside commode, etc,.)?: Unable Help needed moving to and from a bed to chair (including a wheelchair)?: A Little Help needed walking in hospital room?: A Little Help needed climbing 3-5 steps with a railing? : A Lot 6 Click Score: 13    End of Session Equipment Utilized During Treatment: Gait belt Activity Tolerance: Patient limited by pain Patient left: in chair;with call [place]/phone within reach;with chair alarm set Nurse Communication: Mobility status PT Visit Diagnosis: Other abnormalities of gait and mobility (R26.89);Muscle weakness (generalized) (M62.81);Pain Pain - Right/Left: Right Pain - part of body: Hand    Time:  1542-1610 PT Time Calculation (min) (ACUTE ONLY): 28 min   Charges:   PT Evaluation $PT Eval Moderate Complexity: 1 Mod PT Treatments $Therapeutic Activity: 8-22 mins      Ellamae Sia, PT, DPT Acute Rehabilitation Services Pager [phone] Office [phone]  Willy Eddy 03/27/2018, 5:00 PM

## 2018-03-28 ENCOUNTER — Ambulatory Visit: Payer: Medicare Other | Admitting: Internal Medicine

## 2018-03-28 LAB — CBC
HCT: 28.9 % — ABNORMAL LOW (ref 39.0–52.0)
Hemoglobin: 8.2 g/dL — ABNORMAL LOW (ref 13.0–17.0)
MCH: 24.2 pg — AB (ref 26.0–34.0)
MCHC: 28.4 g/dL — AB (ref 30.0–36.0)
MCV: 85.3 fL (ref 80.0–100.0)
NRBC: 2.4 % — AB (ref 0.0–0.2)
PLATELETS: 125 10*3/uL — AB (ref 150–400)
RBC: 3.39 MIL/uL — ABNORMAL LOW (ref 4.22–5.81)
RDW: 17.6 % — AB (ref 11.5–15.5)
WBC: 18.5 10*3/uL — ABNORMAL HIGH (ref 4.0–10.5)

## 2018-03-28 LAB — PATHOLOGIST SMEAR REVIEW

## 2018-03-28 MED ORDER — PREDNISONE 20 MG PO TABS
60.0000 mg | ORAL_TABLET | Freq: Every day | ORAL | Status: AC
  Administered 2018-03-28 – 2018-03-30 (×3): 60 mg via ORAL
  Filled 2018-03-28 (×3): qty 3

## 2018-03-28 NOTE — Plan of Care (Signed)

## 2018-03-28 NOTE — Progress Notes (Signed)
PROGRESS NOTE    Juan [name]  ATF:[id number] DOB: Dec 12, 1940 DOA: 03/24/2018 PCP: Colon Branch, MD    Brief Narrative:   Juan [name]  is a 77 y.o. malewith medical history significant ofallergic rhinitis, anemia, ascending aortic aneurysm, CAD, chronic diastolic CHF, chronic respiratory failure on home oxygen, stage III chronic kidney disease, chronic lower extremities edema, hemorrhoids, history of hydronephrosis, history of splenic infarct, history of thrombocytosis, hypertension, iron deficiency, migraine headaches, moderate to severe pulmonary hypertension, myeloproliferative disease, sleep apnea not on CPAP, type 2 diabetes, Recurrent Pseudomonas UTI who presents with urinary symptoms recent concerns about yet another episode of UTI.    Assessment & Plan:   Principal Problem:   Acute lower UTI/H/o Pseudomonas UTI Active Problems:   Myelofibrosis (HCC)   BPH (benign prostatic hyperplasia)   CKD (chronic kidney disease) stage 3, GFR 30-59 ml/min (HCC)   Type II diabetes mellitus (HCC)   Pulmonary hypertension severe   Myelodysplasia (myelodysplastic syndrome) (HCC)   HTN (hypertension)   Complicated UTI (urinary tract infection)   Pseudomonas urinary tract infection Urinary cultures show 100,000 Pseudomonas aeruginosa sensitive to cefepime Continue with IV cefepime, blood cultures have been negative so far.    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  X-rays show  arthropathy in the second metacarpophalangeal joints.   Stage 3 CKD creatinine improved to 1.2    BPH contributing to his UTI   Hypertension Optimally controlled, added PRN hydralazine   Chronic diastolic heart failure Patient appears to be compensated.  He denies any shortness of breath chest x-ray does not show any evidence of CHF.  Lower extremity edema probably secondary to amlodipine.  Venous duplex negative for  DVT.   Type 2 diabetes mellitus CBG (last 3)  No results for input(s): GLUCAP in the last 72 hours.   Suspected myelofibrosis recommend follow-up with oncologist as outpatient.   Leukocytosis probably from gout flareup.    Anemia of chronic disease hemoglobin at 7.8.  No signs of overt bleeding.  Transfuse to keep hemoglobin greater than 7.  DVT prophylaxis: SCDs Code Status: Full code Family Communication: None at bedside discussed the plan and treatment with the patient) Disposition Plan: Pending clinical improvement   Consultants:   None  Procedures: X-rays of the right hand Antimicrobials: IV cefepime since admission  Subjective: Patient reports the pain in the hand is getting better but not completely resolved if pain, swelling, erythema does not get better, we will add steroids from tomorrow.  Objective: Vitals:   03/27/18 2149 03/28/18 0110 03/28/18 0532 03/28/18 1355  BP:  (!) 152/72 (!) 145/67 115/64  Pulse:  75 78 84  Resp:  18  16  Temp: 99.7 F (37.6 C) 99 F (37.2 C) 98 F (36.7 C) 98.5 F (36.9 C)  TempSrc: Oral Oral Oral Oral  SpO2:  96% 93% 96%    Intake/Output Summary (Last 24 hours) at 03/28/2018 1816 Last data filed at 03/28/2018 1730 Gross per 24 hour  Intake 745 ml  Output 1625 ml  Net -880 ml   There were no vitals filed for this visit.  Examination:  General exam: Not in any kind of distress Respiratory system: Clear to auscultation bilaterally, no wheezing or rhonchi Cardiovascular system: S1 & S2 heard, RRR. No JVD, murmurs, 2+ pedal edema Gastrointestinal system: Abdomen is soft, nontender, nondistended.  With good bowel sounds Central nervous system: Alert and oriented. No focal neurological deficits. Extremities:  Right hand second metacarpophalangeal joint swelling with surrounding area of erythema and warmth and tenderness. Skin: No rashes, lesions or ulcers Psychiatry: . Mood & affect appropriate.     Data Reviewed: I  have personally reviewed following labs and imaging studies  CBC: Recent Labs  Lab 03/25/18 0035 03/25/18 0143 03/25/18 0825 03/27/18 0223 03/28/18 0807  WBC  --  11.9* 10.7* 16.0* 18.5*  NEUTROABS  --  3.4  --   --   --   HGB 8.8* 8.1* 7.9* 7.8* 8.2*  HCT 26.0* 27.5* 26.9* 26.3* 28.9*  MCV  --  86.2 85.1 85.4 85.3  PLT  --  163 159 142* 235*   Basic Metabolic Panel: Recent Labs  Lab 03/25/18 0025 03/25/18 0035 03/25/18 0825 03/27/18 0223  NA 139 138 141 138  K 5.2* 5.1 4.0 4.2  CL 107 107 108 108  CO2 23  --  25 23  GLUCOSE 98 96 100* 133*  BUN 54* 67* 52* 36*  CREATININE 1.35* 1.50* 1.33* 1.21  CALCIUM 9.2  --  9.4 9.3   GFR: CrCl cannot be calculated (Unknown ideal weight.). Liver Function Tests: No results for input(s): AST, ALT, ALKPHOS, BILITOT, PROT, ALBUMIN in the last 168 hours. No results for input(s): LIPASE, AMYLASE in the last 168 hours. No results for input(s): AMMONIA in the last 168 hours. Coagulation Profile: No results for input(s): INR, PROTIME in the last 168 hours. Cardiac Enzymes: No results for input(s): CKTOTAL, CKMB, CKMBINDEX, TROPONINI in the last 168 hours. BNP (last 3 results) No results for input(s): PROBNP in the last 8760 hours. HbA1C: No results for input(s): HGBA1C in the last 72 hours. CBG: No results for input(s): GLUCAP in the last 168 hours. Lipid Profile: No results for input(s): CHOL, HDL, LDLCALC, TRIG, CHOLHDL, LDLDIRECT in the last 72 hours. Thyroid Function Tests: No results for input(s): TSH, T4TOTAL, FREET4, T3FREE, THYROIDAB in the last 72 hours. Anemia Panel: No results for input(s): VITAMINB12, FOLATE, FERRITIN, TIBC, IRON, RETICCTPCT in the last 72 hours. Sepsis Labs: No results for input(s): PROCALCITON, LATICACIDVEN in the last 168 hours.  Recent Results (from the past 240 hour(s))  Urine culture     Status: Abnormal   Collection Time: 03/25/18 12:25 AM  Result Value Ref Range Status   Specimen  Description URINE, CLEAN CATCH  Final   Special Requests   Final    NONE Performed at [facility], [address], [place], [place] [address]    Culture >=100,000 COLONIES/mL PSEUDOMONAS AERUGINOSA (A)  Final   Report Status 03/27/2018 FINAL  Final   Organism ID, Bacteria PSEUDOMONAS AERUGINOSA (A)  Final      Susceptibility   Pseudomonas aeruginosa - MIC*    CEFTAZIDIME 4 SENSITIVE Sensitive     CIPROFLOXACIN >=4 RESISTANT Resistant     GENTAMICIN 4 SENSITIVE Sensitive     IMIPENEM 2 SENSITIVE Sensitive     PIP/TAZO 8 SENSITIVE Sensitive     CEFEPIME 8 SENSITIVE Sensitive     * >=100,000 COLONIES/mL PSEUDOMONAS AERUGINOSA  Culture, blood (Routine X 2) w Reflex to ID Panel     Status: None (Preliminary result)   Collection Time: 03/25/18  5:53 AM  Result Value Ref Range Status   Specimen Description BLOOD RIGHT ARM  Final   Special Requests   Final    BOTTLES DRAWN AEROBIC ONLY Blood Culture results may not be optimal due to an inadequate volume of blood received in culture bottles   Culture  Final    NO GROWTH 3 DAYS Performed at [facility], [place] [address], [place], [place] [address]    Report Status PENDING  Incomplete         Radiology Studies: No results found.      Scheduled Meds: . aspirin EC  325 mg Oral Daily  . B-complex with vitamin C  1 tablet Oral Daily  . colchicine  0.6 mg Oral BID  . ferrous gluconate  324 mg Oral Q breakfast  . furosemide  40 mg Oral Daily  . heparin  5,000 Units Subcutaneous Q8H  . hydrALAZINE  100 mg Oral Q8H  . hydrocortisone cream   Topical QID  . isosorbide mononitrate  30 mg Oral Daily  . mouth rinse  15 mL Mouth Rinse BID  . predniSONE  60 mg Oral QAC breakfast  . sodium chloride flush  3 mL Intravenous Q12H  . tamsulosin  0.4 mg Oral QPC supper   Continuous Infusions: . sodium chloride 250 mL (03/25/18 2133)  . ceFEPime (MAXIPIME) IV 1 g (03/28/18 0537)     LOS: 3 days    Time spent: [place], MD [facility] Pager [phone]  If 7PM-7AM, please contact night-coverage [email or web address] Password [facility] 03/28/2018, 6:16 PM

## 2018-03-28 NOTE — Plan of Care (Signed)
  Problem: Education: Goal: Knowledge of General Education information will improve Description Including pain rating scale, medication(s)/side effects and non-pharmacologic comfort measures Outcome: Progressing   Problem: Health Behavior/Discharge Planning: Goal: Ability to manage health-related needs will improve Outcome: Progressing   Problem: Clinical Measurements: Goal: Ability to maintain clinical measurements within normal limits will improve Outcome: Progressing Goal: Will remain free from infection Outcome: Progressing Goal: Diagnostic test results will improve Outcome: Progressing Goal: Respiratory complications will improve Outcome: Progressing Goal: Cardiovascular complication will be avoided Outcome: Progressing   Problem: Activity: Goal: Risk for activity intolerance will decrease Outcome: Progressing   Problem: Coping: Goal: Level of anxiety will decrease Outcome: Progressing   Problem: Elimination: Goal: Will not experience complications related to bowel motility Outcome: Progressing   Problem: Pain Managment: Goal: General experience of comfort will improve Outcome: Progressing   Problem: Safety: Goal: Ability to remain free from injury will improve Outcome: Progressing   Problem: Urinary Elimination: Goal: Signs and symptoms of infection will decrease Outcome: Progressing

## 2018-03-29 NOTE — Progress Notes (Signed)
PT Cancellation Note  Patient Details Name: Juan [name] MRN: [id number] DOB: Oct 12, 1940   Cancelled Treatment:    Reason Eval/Treat Not Completed: Patient declined, no reason specified Patient declined mobility at this time. PT will continue to follow acutely.    [place]ina April, PTA Acute Rehabilitation Services Pager: [phone] Office: [phone]   03/29/2018, 1:28 PM

## 2018-03-29 NOTE — Progress Notes (Signed)
CSW provided SNF consult, patient reported he is declining SNF at this time. Patient states he has three family members at home that assist him with mobility if he needs it, and he would like to set up home health at home. Patient reports he has all needed equipment at home.   RNCM notified, [place] signing off.   [place], [place]

## 2018-03-29 NOTE — Progress Notes (Signed)
PROGRESS NOTE    Juan [name]  POE:[id number] DOB: 05-Nov-1940 DOA: 03/24/2018 PCP: Colon Branch, MD    Brief Narrative:   Juan [name]  is a 77 y.o. malewith medical history significant ofallergic rhinitis, anemia, ascending aortic aneurysm, CAD, chronic diastolic CHF, chronic respiratory failure on home oxygen, stage III chronic kidney disease, chronic lower extremities edema, hemorrhoids, history of hydronephrosis, history of splenic infarct, history of thrombocytosis, hypertension, iron deficiency, migraine headaches, moderate to severe pulmonary hypertension, myeloproliferative disease, sleep apnea not on CPAP, type 2 diabetes, Recurrent Pseudomonas UTI who presents with urinary symptoms recent concerns about yet another episode of UTI.    Assessment & Plan:   Principal Problem:   Acute lower UTI/H/o Pseudomonas UTI Active Problems:   Myelofibrosis (HCC)   BPH (benign prostatic hyperplasia)   CKD (chronic kidney disease) stage 3, GFR 30-59 ml/min (HCC)   Type II diabetes mellitus (HCC)   Pulmonary hypertension severe   Myelodysplasia (myelodysplastic syndrome) (HCC)   HTN (hypertension)   Complicated UTI (urinary tract infection)   Pseudomonas urinary tract infection Urinary cultures show 100,000 Pseudomonas aeruginosa sensitive to cefepime Continue with IV cefepime, blood cultures have been negative so far. Day 5 of IV cefepime.  Plan to transition to oral antibiotics in am and possible d.c home with home health.     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  X-rays show  arthropathy in the second metacarpophalangeal joints. Started the patient on prednisone, and his swelling and tenderness is much improved but not resolved yet. Plan to transition to oral prednisone 40 mg for 5 days on discharge.   Stage 3 CKD creatinine improved to 1.2 .     BPH contributing to his  UTI   Hypertension Optimally controlled, added PRN hydralazine   Chronic diastolic heart failure Patient appears to be compensated.  He denies any shortness of breath chest x-ray does not show any evidence of CHF.  Lower extremity edema probably secondary to amlodipine.  Venous duplex negative for DVT.   Type 2 diabetes mellitus Diet controlled.   Suspected myelofibrosis recommend follow-up with oncologist as outpatient.   Leukocytosis probably from gout flareup vs steroid use.      Anemia of chronic disease  hemoglobin at 8.2.   No signs of overt bleeding.   Transfuse to keep hemoglobin greater than 7.    DVT prophylaxis: SCDs Code Status: Full code Family Communication: None at bedside discussed the plan and treatment with the patient) Disposition Plan: possible d/c home tomorrow.    Consultants:   None  Procedures: X-rays of the right hand Antimicrobials: IV cefepime since admission  Subjective: Pain much better today. He did not want to go SNF on discharge, wants to go home. Afebrile today.   Objective: Vitals:   03/28/18 1355 03/28/18 1943 03/29/18 0349 03/29/18 1302  BP: 115/64 (!) 144/76 (!) 138/58 (!) 159/75  Pulse: 84 (!) 102 61 67  Resp: 16 16 16 20   Temp: 98.5 F (36.9 C) 97.9 F (36.6 C) 98.2 F (36.8 C) (!) 97.5 F (36.4 C)  TempSrc: Oral Oral  Oral  SpO2: 96% 93% 98% 95%    Intake/Output Summary (Last 24 hours) at 03/29/2018 1809 Last data filed at 03/29/2018 0900 Gross per 24 hour  Intake 640 ml  Output 1000 ml  Net -360 ml   There were no vitals filed for this visit.  Examination:  General exam: alert , and  sitting in the chair, not in any distress.  Respiratory system: air entry fair, no wheezing or rhonchi.  Cardiovascular system: S1 & S2 heard, RRR. No JVD, murmurs, Gastrointestinal system: Abdomen is soft, NT nd bs+ Central nervous system: Alert and oriented. No focal neurological deficits. Extremities: Right hand second  metacarpophalangeal joint swelling with surrounding area of erythema and warmth and tenderness has much improved.  Skin: No rashes, lesions or ulcers Psychiatry: . Mood & affect appropriate.     Data Reviewed: I have personally reviewed following labs and imaging studies  CBC: Recent Labs  Lab 03/25/18 0035 03/25/18 0143 03/25/18 0825 03/27/18 0223 03/28/18 0807  WBC  --  11.9* 10.7* 16.0* 18.5*  NEUTROABS  --  3.4  --   --   --   HGB 8.8* 8.1* 7.9* 7.8* 8.2*  HCT 26.0* 27.5* 26.9* 26.3* 28.9*  MCV  --  86.2 85.1 85.4 85.3  PLT  --  163 159 142* 580*   Basic Metabolic Panel: Recent Labs  Lab 03/25/18 0025 03/25/18 0035 03/25/18 0825 03/27/18 0223  NA 139 138 141 138  K 5.2* 5.1 4.0 4.2  CL 107 107 108 108  CO2 23  --  25 23  GLUCOSE 98 96 100* 133*  BUN 54* 67* 52* 36*  CREATININE 1.35* 1.50* 1.33* 1.21  CALCIUM 9.2  --  9.4 9.3   GFR: CrCl cannot be calculated (Unknown ideal weight.). Liver Function Tests: No results for input(s): AST, ALT, ALKPHOS, BILITOT, PROT, ALBUMIN in the last 168 hours. No results for input(s): LIPASE, AMYLASE in the last 168 hours. No results for input(s): AMMONIA in the last 168 hours. Coagulation Profile: No results for input(s): INR, PROTIME in the last 168 hours. Cardiac Enzymes: No results for input(s): CKTOTAL, CKMB, CKMBINDEX, TROPONINI in the last 168 hours. BNP (last 3 results) No results for input(s): PROBNP in the last 8760 hours. HbA1C: No results for input(s): HGBA1C in the last 72 hours. CBG: No results for input(s): GLUCAP in the last 168 hours. Lipid Profile: No results for input(s): CHOL, HDL, LDLCALC, TRIG, CHOLHDL, LDLDIRECT in the last 72 hours. Thyroid Function Tests: No results for input(s): TSH, T4TOTAL, FREET4, T3FREE, THYROIDAB in the last 72 hours. Anemia Panel: No results for input(s): VITAMINB12, FOLATE, FERRITIN, TIBC, IRON, RETICCTPCT in the last 72 hours. Sepsis Labs: No results for input(s):  PROCALCITON, LATICACIDVEN in the last 168 hours.  Recent Results (from the past 240 hour(s))  Urine culture     Status: Abnormal   Collection Time: 03/25/18 12:25 AM  Result Value Ref Range Status   Specimen Description URINE, CLEAN CATCH  Final   Special Requests   Final    NONE Performed at [facility], [address], [place], [place] [address]    Culture >=100,000 COLONIES/mL PSEUDOMONAS AERUGINOSA (A)  Final   Report Status 03/27/2018 FINAL  Final   Organism ID, Bacteria PSEUDOMONAS AERUGINOSA (A)  Final      Susceptibility   Pseudomonas aeruginosa - MIC*    CEFTAZIDIME 4 SENSITIVE Sensitive     CIPROFLOXACIN >=4 RESISTANT Resistant     GENTAMICIN 4 SENSITIVE Sensitive     IMIPENEM 2 SENSITIVE Sensitive     PIP/TAZO 8 SENSITIVE Sensitive     CEFEPIME 8 SENSITIVE Sensitive     * >=100,000 COLONIES/mL PSEUDOMONAS AERUGINOSA  Culture, blood (Routine X 2) w Reflex to ID Panel     Status: None (Preliminary result)   Collection Time: 03/25/18  5:53  AM  Result Value Ref Range Status   Specimen Description BLOOD RIGHT ARM  Final   Special Requests   Final    BOTTLES DRAWN AEROBIC ONLY Blood Culture results may not be optimal due to an inadequate volume of blood received in culture bottles   Culture   Final    NO GROWTH 4 DAYS Performed at [facility], [place] [address], [place], [place] [address]    Report Status PENDING  Incomplete  Culture, blood (Routine X 2) w Reflex to ID Panel     Status: None (Preliminary result)   Collection Time: 03/28/18  8:25 AM  Result Value Ref Range Status   Specimen Description BLOOD LEFT HAND  Final   Special Requests   Final    BOTTLES DRAWN AEROBIC ONLY Blood Culture adequate volume   Culture   Final    NO GROWTH 1 DAY Performed at [facility], [place] [address], [place], [place] [address]    Report Status PENDING  Incomplete  Culture, blood (Routine X 2) w Reflex to ID Panel     Status: None (Preliminary result)    Collection Time: 03/28/18  8:25 AM  Result Value Ref Range Status   Specimen Description BLOOD LEFT HAND  Final   Special Requests   Final    BOTTLES DRAWN AEROBIC ONLY Blood Culture adequate volume   Culture   Final    NO GROWTH 1 DAY Performed at [facility], [place] [address], [place], [place] [address]    Report Status PENDING  Incomplete         Radiology Studies: No results found.      Scheduled Meds: . aspirin EC  325 mg Oral Daily  . B-complex with vitamin C  1 tablet Oral Daily  . colchicine  0.6 mg Oral BID  . ferrous gluconate  324 mg Oral Q breakfast  . furosemide  40 mg Oral Daily  . heparin  5,000 Units Subcutaneous Q8H  . hydrALAZINE  100 mg Oral Q8H  . hydrocortisone cream   Topical QID  . isosorbide mononitrate  30 mg Oral Daily  . mouth rinse  15 mL Mouth Rinse BID  . predniSONE  60 mg Oral QAC breakfast  . sodium chloride flush  3 mL Intravenous Q12H  . tamsulosin  0.4 mg Oral QPC supper   Continuous Infusions: . sodium chloride 250 mL (03/25/18 2133)  . ceFEPime (MAXIPIME) IV 1 g (03/29/18 0640)     LOS: 4 days    Time spent: Jal, MD [facility] Pager [phone]  If 7PM-7AM, please contact night-coverage [email or web address] Password [email or web address] 03/29/2018, 6:09 PM

## 2018-03-29 NOTE — Plan of Care (Signed)

## 2018-03-29 NOTE — NC FL2 (Signed)
[place] LEVEL OF CARE SCREENING TOOL     IDENTIFICATION  Patient Name: Juan [name] Birthdate: August 22, 1940 Sex: male Admission Date (Current Location): 03/24/2018  [facility] and [place] Number:  Herbalist and Address:  [facility], [place] [address] [place], [place] [address]      Provider Number: [id number]  Attending Physician Name and Address:  Hosie Poisson, MD  Relative Name and Phone Number:  [name] (daughter) [phone]    Current Level of Care: Hospital Recommended Level of Care: [place] Prior Approval Number:    Date Approved/Denied:   PASRR Number: [id number]  Discharge Plan: SNF    Current Diagnoses: Patient Active Problem List   Diagnosis Date Noted  . Acute lower UTI/H/o Pseudomonas UTI 03/25/2018  . Complicated UTI (urinary tract infection) 03/25/2018  . Goals of care, counseling/discussion 03/17/2018  . UTI (urinary tract infection) 03/17/2018  . ARF (acute renal failure) ([place]) 09/24/2017  . VRE (vancomycin resistant enterococcus) culture positive   . Staphylococcus epidermidis infection   . Acute encephalopathy 08/18/2017  . Sleep apnea, obstructive 08/18/2017  . 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 Pseudomonas urinary tract infection 08/18/2017  . Moderate to severe pulmonary hypertension ([place]) 08/18/2017  . Chronic respiratory failure with hypoxia ([place]) 08/18/2017  . Chronic diastolic CHF (congestive heart failure), NYHA class 2 ([place]) 08/18/2017  . Myelodysplasia (myelodysplastic syndrome) ([place]) 08/18/2017  . HTN (hypertension) 08/18/2017  . Pyelonephritis 07/17/2017  . Acute GI bleeding 06/07/2017  . Hypokalemia   . Iron deficiency anemia due to chronic blood loss 03/31/2017  . 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 Urinary retention 01/24/2017  . 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 Elevated troponin 11/14/2016  . Recurrent pneumonia 10/31/2016  .  Pulmonary hypertension severe   . Sprain of left rotator cuff capsule   . Hyperkalemia   . Paroxysmal SVT (supraventricular tachycardia) ([place]) 04/20/2016  . Type II diabetes mellitus ([place]) 04/20/2016  . Gout 04/20/2016  . B12 deficiency 04/20/2016  . 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 Hydronephrosis   . Thrombocytosis ([place]) 04/07/2016  . T wave inversion in EKG 12/29/2015  . PCP NOTES >>>>>>>>>>>>>>>>> 05/02/2015  . Peripheral edema   . Thoracic aortic aneurysm ([place]) 04/26/2014  . CKD (chronic kidney disease) stage 3, GFR 30-59 ml/min (HCC) 04/26/2014  . Generalized weakness 04/06/2014  . Venous stasis dermatitis of both lower extremities   . Morbid obesity due to excess calories ([place]) 03/29/2014  . 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 Leukocytosis 10/17/2013  . Poor compliance with advise  05/05/2012  . Annual physical exam 01/14/2012  . Weight loss 01/14/2012  . BPH (benign prostatic hyperplasia) 01/14/2012  . Myelofibrosis ([place]) 09/07/2006  . Obstructive sleep apnea 09/07/2006  . Hypertensive heart disease with chronic diastolic congestive heart failure ([place]) 09/07/2006  . Coronary atherosclerosis 09/07/2006  . Allergic rhinitis 09/07/2006    Orientation RESPIRATION BLADDER Height & Weight     Self, Time, Situation, Place  O2(nasal cannula 2L/min) Continent, External catheter Weight:   Height:     BEHAVIORAL SYMPTOMS/MOOD NEUROLOGICAL BOWEL NUTRITION STATUS      Continent Diet(see discharge summary)  AMBULATORY STATUS COMMUNICATION OF NEEDS Skin   Limited Assist Verbally Other (Comment)(skin tear/cracking on right and left feet, ecchymosis left and right abdomen, skin tear penis posterior upper)  Personal Care Assistance Level of Assistance  Bathing, Feeding, Dressing, Total care Bathing Assistance: Limited assistance Feeding assistance: Independent Dressing Assistance: Limited assistance Total Care  Assistance: Limited assistance   Functional Limitations Info  Sight, Hearing, Speech Sight Info: Adequate Hearing Info: Adequate Speech Info: Adequate    SPECIAL CARE FACTORS FREQUENCY  PT (By licensed PT), OT (By licensed OT)     PT Frequency: min 5x weekly OT Frequency: min 3x weekly            Contractures Contractures Info: Not present    Additional Factors Info  Allergies, Isolation Precautions, Code Status Code Status Info: full Allergies Info: Allergies:  No Known Allergies     Isolation Precautions Info: vancomycin resistant enterococcus, mrsa by pcr     Current Medications (03/29/2018):  This is the current hospital active medication list Current Facility-Administered Medications  Medication Dose Route Frequency Provider Last Rate Last Dose  . 0.9 %  sodium chloride infusion  250 mL Intravenous PRN Emokpae, Courage, MD 10 mL/hr at 03/25/18 2133 250 mL at 03/25/18 2133  . acetaminophen (TYLENOL) tablet 650 mg  650 mg Oral Q6H PRN Roxan Hockey, MD   650 mg at 03/28/18 2257   Or  . acetaminophen (TYLENOL) suppository 650 mg  650 mg Rectal Q6H PRN Emokpae, Courage, MD      . albuterol (PROVENTIL) (2.5 MG/3ML) 0.083% nebulizer solution 2.5 mg  2.5 mg Nebulization Q2H PRN Emokpae, Courage, MD      . aspirin EC tablet 325 mg  325 mg Oral Daily Emokpae, Courage, MD   325 mg at 03/29/18 1103  . B-complex with vitamin C tablet 1 tablet  1 tablet Oral Daily Emokpae, Courage, MD   1 tablet at 03/29/18 1102  . benzonatate (TESSALON) capsule 100 mg  100 mg Oral TID PRN Emokpae, Courage, MD      . ceFEPIme (MAXIPIME) 1 g in sodium chloride 0.9 % 100 mL IVPB  1 g Intravenous Q12H Hosie Poisson, MD 200 mL/hr at 03/29/18 0640 1 g at 03/29/18 0640  . colchicine tablet 0.6 mg  0.6 mg Oral BID Hosie Poisson, MD   0.6 mg at 03/29/18 1104  . diphenhydrAMINE-zinc acetate (BENADRYL) 2-0.1 % cream   Topical TID PRN Hosie Poisson, MD   1 application at 93/79/02 0302  . ferrous gluconate  (FERGON) tablet 324 mg  324 mg Oral Q breakfast Denton Brick, Courage, MD   324 mg at 03/29/18 1103  . furosemide (LASIX) tablet 40 mg  40 mg Oral Daily Emokpae, Courage, MD   40 mg at 03/29/18 1103  . heparin injection 5,000 Units  5,000 Units Subcutaneous Q8H Emokpae, Courage, MD   5,000 Units at 03/29/18 0637  . hydrALAZINE (APRESOLINE) tablet 100 mg  100 mg Oral Q8H Emokpae, Courage, MD   100 mg at 03/29/18 0636  . hydrocortisone cream 1 %   Topical QID Hosie Poisson, MD      . isosorbide mononitrate (IMDUR) 24 hr tablet 30 mg  30 mg Oral Daily Emokpae, Courage, MD   30 mg at 03/29/18 1103  . labetalol (NORMODYNE,TRANDATE) injection 10 mg  10 mg Intravenous Q4H PRN Emokpae, Courage, MD      . MEDLINE mouth rinse  15 mL Mouth Rinse BID Emokpae, Courage, MD   15 mL at 03/29/18 1102  . ondansetron (ZOFRAN) tablet 4 mg  4 mg Oral Q6H PRN Emokpae, Courage, MD       Or  . ondansetron (ZOFRAN) injection 4 mg  4 mg Intravenous Q6H PRN Emokpae, Courage, MD      . oxyCODONE (Oxy IR/ROXICODONE) immediate release tablet 10 mg  10 mg Oral Q4H PRN Hosie Poisson, MD   10 mg at 03/28/18 0831  . polyethylene glycol (MIRALAX / GLYCOLAX) packet 17 g  17 g Oral Daily PRN Emokpae, Courage, MD      . predniSONE (DELTASONE) tablet 60 mg  60 mg Oral QAC breakfast Hosie Poisson, MD   60 mg at 03/29/18 1103  . sodium chloride flush (NS) 0.9 % injection 3 mL  3 mL Intravenous Q12H Emokpae, Courage, MD   3 mL at 03/29/18 1104  . sodium chloride flush (NS) 0.9 % injection 3 mL  3 mL Intravenous PRN Emokpae, Courage, MD      . tamsulosin (FLOMAX) capsule 0.4 mg  0.4 mg Oral QPC supper Denton Brick, Courage, MD   0.4 mg at 03/28/18 1839  . traZODone (DESYREL) tablet 50 mg  50 mg Oral QHS PRN Roxan Hockey, MD   50 mg at 03/28/18 0115     Discharge Medications: Please see discharge summary for a list of discharge medications.  Relevant Imaging Results:  Relevant Lab Results:   Additional Information SSN:  [id number]  Alberteen Sam, LCSW

## 2018-03-30 ENCOUNTER — Encounter (HOSPITAL_COMMUNITY): Payer: Self-pay | Admitting: *Deleted

## 2018-03-30 ENCOUNTER — Inpatient Hospital Stay (HOSPITAL_COMMUNITY): Payer: Medicare Other

## 2018-03-30 LAB — CULTURE, BLOOD (ROUTINE X 2): CULTURE: NO GROWTH

## 2018-03-30 LAB — BASIC METABOLIC PANEL
ANION GAP: 7 (ref 5–15)
BUN: 51 mg/dL — ABNORMAL HIGH (ref 8–23)
CALCIUM: 9.1 mg/dL (ref 8.9–10.3)
CO2: 21 mmol/L — ABNORMAL LOW (ref 22–32)
Chloride: 106 mmol/L (ref 98–111)
Creatinine, Ser: 1.72 mg/dL — ABNORMAL HIGH (ref 0.61–1.24)
GFR calc Af Amer: 43 mL/min — ABNORMAL LOW (ref >60)
GFR calc non Af Amer: 37 mL/min — ABNORMAL LOW (ref >60)
GLUCOSE: 130 mg/dL — AB (ref 70–99)
POTASSIUM: 5 mmol/L (ref 3.5–5.1)
Sodium: 134 mmol/L — ABNORMAL LOW (ref 135–145)

## 2018-03-30 LAB — CBC
HEMATOCRIT: 25.9 % — AB (ref 39.0–52.0)
HEMOGLOBIN: 7.3 g/dL — AB (ref 13.0–17.0)
MCH: 23.9 pg — AB (ref 26.0–34.0)
MCHC: 28.2 g/dL — ABNORMAL LOW (ref 30.0–36.0)
MCV: 84.9 fL (ref 80.0–100.0)
Platelets: 118 10*3/uL — ABNORMAL LOW (ref 150–400)
RBC: 3.05 MIL/uL — AB (ref 4.22–5.81)
RDW: 17.2 % — ABNORMAL HIGH (ref 11.5–15.5)
WBC: 19.8 10*3/uL — ABNORMAL HIGH (ref 4.0–10.5)
nRBC: 1.2 % — ABNORMAL HIGH (ref 0.0–0.2)

## 2018-03-30 MED ORDER — SODIUM CHLORIDE 0.9 % IV SOLN: 250.0000 mL | INTRAVENOUS | Status: DC | PRN

## 2018-03-30 MED ORDER — SODIUM CHLORIDE 0.9 % IV SOLN
2.0000 g | INTRAVENOUS | Status: DC
  Administered 2018-03-30 – 2018-04-01 (×3): 2 g via INTRAVENOUS
  Filled 2018-03-30 (×6): qty 2

## 2018-03-30 MED ORDER — SODIUM CHLORIDE 0.9 % IV SOLN
250.0000 mL | INTRAVENOUS | Status: DC
  Administered 2018-03-30 – 2018-03-31 (×2): 250 mL via INTRAVENOUS

## 2018-03-30 NOTE — Consult Note (Signed)
   [facility] CM Inpatient Consult   03/30/2018  Juan [name] [birth year] [id number]   Patient screened for potential [facility] Care Management services due to unplanned readmission risk score of 41% (extreme).  Went to bedside to speak with Juan [name] about [place] Management program. Juan [name] declines by stating " I do not need anyone to call. I will call you if I need your services. Leave your number."  Juan [name] was preoccupied about when he was going to receive an iv medication and an ultrasound. Nursing aware of Juan [name] concerns.  [facility] Care Management brochure with contact information provided.  Made (covering) inpatient RNCM aware [place] Management services were declined.    Marthenia Rolling, MSN-Ed, RN,BSN [facility] Liaison [phone]

## 2018-03-30 NOTE — Progress Notes (Signed)
PROGRESS NOTE    Juan [name]  ZOX:[id number] DOB: 1941/03/24 DOA: 03/24/2018 PCP: Colon Branch, MD   Brief Narrative: "[name] y.o.malewith medical history significant ofallergic rhinitis, anemia, ascending aortic aneurysm, CAD, chronic diastolic CHF, chronic respiratory failure on home oxygen, stage III chronic kidney disease, chronic lower extremities edema, hemorrhoids, history of hydronephrosis, history of splenic infarct, history of thrombocytosis, hypertension, iron deficiency, migraine headaches, moderate to severe pulmonary hypertension, myeloproliferative disease, sleep apnea not on CPAP, type 2 diabetes,Recurrent Pseudomonas UTI whopresents with urinary symptoms recent concerns about yet another episode of UTI"  Assessment & Plan:   Acute lower UTI/H/o Pseudomonas UTI, hx of Complicated UTI: No oral alternatives as Pseudomonas resistant to ciprofloxacin.  Continue on cefepime for now, as patient has worsening leukocytosis, could be secondary to steroid but in the setting of Pseudomonas UTI will continue on antibiotics. He is hemodynamically stable, afebrile.  Acute gout flareup of the second metacarpophalangeal joint: he has worsening rt hand swelling, also with pain: Felt to be from gout.  Prednisone 60 mg x 3 days received, on colchicine.  Some worsening pain/swelling, obtain duplex right arm to rule out DVT.  X-ray of the hand Showed arthropathic changes at the second metacarpal phalangeal joint with soft tissue swelling that extends to the proximal index finger. His rt arm neurovascular status intact.  Remove IV line from the right arm in place in the left arm.Elevate rt arm. If does not improve we will c/s ortho  BPH contributing to UTI.  Contin Flomax.  CKD (chronic kidney disease) stage 3, GFR 30-59 ml/min" baseline creat ~ 1.3-1.6, Creat slightly up today at 1.7. Hold lasix, add gentle ivf overnight, repeat bmp in am.  Type II diabetes mellitus hx. appears  controlled, last hemoglobin A1c 5.1 12/16/1912  Chronic diastolic CHF appears compensated, on Lasix will hold for now.  Chest x-ray with chronic diffuse bilateral interstitial and groundglass opacity  Myelodysplasia/myelofibrosis: Follow-up with this physician.  Hemoglobin 7-8 grams stable, with thrombocytopenia, plat at 118k. monitor  HTN: bp overall stable, continue hydralazine, Imdur.  DVT prophylaxis: Subcu heparin. Code Status: Full code Family Communication: Patient updated with plan of care and in agreement. Disposition Plan: Home next 1-2 days if remains stable.  Subjective: Patient complains of worsening right hand swelling.  Also has pain.  Denies nausea, vomiting, chest pain fever or chills. Objective: Vitals:   03/29/18 1302 03/29/18 1947 03/29/18 2131 03/30/18 0509  BP: (!) 159/75 130/67 124/60 (!) 168/67  Pulse: 67 89  73  Resp: 20 18  18   Temp: (!) 97.5 F (36.4 C) 98.2 F (36.8 C)    TempSrc: Oral Oral    SpO2: 95% 96%  96%   No intake or output data in the 24 hours ending 03/30/18 1143 There were no vitals filed for this visit. Weight change:   There is no height or weight on file to calculate BMI.  Examination:  General exam: Appears calm and comfortable.  Appears older for his age. HEENT:PERRL,Oral mucosa moist, Ear/Nose normal on gross exam Respiratory system: Bilateral equal air entry, normal vesicular breath sounds, no wheezes or crackles  Cardiovascular system: S1 & S2 +, RRR.  Gastrointestinal system: Abdomen soft, nontender and nondistended.  BS + Nervous System:Alert and oriented. No focal neurological deficits. Extremities: Chronic lower extremity edema, improved with dressing, Rt  Hand and forearm swollen and tender rt hand, distal peripheral pulses palpable. Skin: No rashes, lesions or ulcers,no icterus ,no pallor MSK: Normal muscle bulk,tone ,power  Data  Reviewed: I have personally reviewed following labs and imaging studies  CBC: Recent Labs   Lab 03/25/18 0143 03/25/18 0825 03/27/18 0223 03/28/18 0807 03/30/18 0205  WBC 11.9* 10.7* 16.0* 18.5* 19.8*  NEUTROABS 3.4  --   --   --   --   HGB 8.1* 7.9* 7.8* 8.2* 7.3*  HCT 27.5* 26.9* 26.3* 28.9* 25.9*  MCV 86.2 85.1 85.4 85.3 84.9  PLT 163 159 142* 125* 102*   Basic Metabolic Panel: Recent Labs  Lab 03/25/18 0025 03/25/18 0035 03/25/18 0825 03/27/18 0223 03/30/18 0205  NA 139 138 141 138 134*  K 5.2* 5.1 4.0 4.2 5.0  CL 107 107 108 108 106  CO2 23  --  25 23 21*  GLUCOSE 98 96 100* 133* 130*  BUN 54* 67* 52* 36* 51*  CREATININE 1.35* 1.50* 1.33* 1.21 1.72*  CALCIUM 9.2  --  9.4 9.3 9.1   GFR: CrCl cannot be calculated (Unknown ideal weight.). Liver Function Tests: No results for input(s): AST, ALT, ALKPHOS, BILITOT, PROT, ALBUMIN in the last 168 hours. No results for input(s): LIPASE, AMYLASE in the last 168 hours. No results for input(s): AMMONIA in the last 168 hours. Coagulation Profile: No results for input(s): INR, PROTIME in the last 168 hours. Cardiac Enzymes: No results for input(s): CKTOTAL, CKMB, CKMBINDEX, TROPONINI in the last 168 hours. BNP (last 3 results) No results for input(s): PROBNP in the last 8760 hours. HbA1C: No results for input(s): HGBA1C in the last 72 hours. CBG: No results for input(s): GLUCAP in the last 168 hours. Lipid Profile: No results for input(s): CHOL, HDL, LDLCALC, TRIG, CHOLHDL, LDLDIRECT in the last 72 hours. Thyroid Function Tests: No results for input(s): TSH, T4TOTAL, FREET4, T3FREE, THYROIDAB in the last 72 hours. Anemia Panel: No results for input(s): VITAMINB12, FOLATE, FERRITIN, TIBC, IRON, RETICCTPCT in the last 72 hours. Sepsis Labs: No results for input(s): PROCALCITON, LATICACIDVEN in the last 168 hours.  Recent Results (from the past 240 hour(s))  Urine culture     Status: Abnormal   Collection Time: 03/25/18 12:25 AM  Result Value Ref Range Status   Specimen Description URINE, CLEAN CATCH  Final     Special Requests   Final    NONE Performed at [facility], [address], [place], [place] [address]    Culture >=100,000 COLONIES/mL PSEUDOMONAS AERUGINOSA (A)  Final   Report Status 03/27/2018 FINAL  Final   Organism ID, Bacteria PSEUDOMONAS AERUGINOSA (A)  Final      Susceptibility   Pseudomonas aeruginosa - MIC*    CEFTAZIDIME 4 SENSITIVE Sensitive     CIPROFLOXACIN >=4 RESISTANT Resistant     GENTAMICIN 4 SENSITIVE Sensitive     IMIPENEM 2 SENSITIVE Sensitive     PIP/TAZO 8 SENSITIVE Sensitive     CEFEPIME 8 SENSITIVE Sensitive     * >=100,000 COLONIES/mL PSEUDOMONAS AERUGINOSA  Culture, blood (Routine X 2) w Reflex to ID Panel     Status: None   Collection Time: 03/25/18  5:53 AM  Result Value Ref Range Status   Specimen Description BLOOD RIGHT ARM  Final   Special Requests   Final    BOTTLES DRAWN AEROBIC ONLY Blood Culture results may not be optimal due to an inadequate volume of blood received in culture bottles   Culture   Final    NO GROWTH 5 DAYS Performed at [facility], [place] [address] [place], [place] [address]    Report Status 03/30/2018 FINAL  Final  Culture, blood (Routine X 2) w Reflex to ID Panel     Status: None (Preliminary result)   Collection Time: 03/28/18  8:25 AM  Result Value Ref Range Status   Specimen Description BLOOD LEFT HAND  Final   Special Requests   Final    BOTTLES DRAWN AEROBIC ONLY Blood Culture adequate volume   Culture   Final    NO GROWTH 2 DAYS Performed at [facility], [address], [place], [place] [address]    Report Status PENDING  Incomplete  Culture, blood (Routine X 2) w Reflex to ID Panel     Status: None (Preliminary result)   Collection Time: 03/28/18  8:25 AM  Result Value Ref Range Status   Specimen Description BLOOD LEFT HAND  Final   Special Requests   Final    BOTTLES DRAWN AEROBIC ONLY Blood Culture adequate volume   Culture   Final    NO GROWTH 2 DAYS Performed at [facility], [place] [address] [place], [place] [address]    Report Status PENDING  Incomplete      Radiology Studies: No results found.      Scheduled Meds: . aspirin EC  325 mg Oral Daily  . B-complex with vitamin C  1 tablet Oral Daily  . colchicine  0.6 mg Oral BID  . ferrous gluconate  324 mg Oral Q breakfast  . heparin  5,000 Units Subcutaneous Q8H  . hydrALAZINE  100 mg Oral Q8H  . hydrocortisone cream   Topical QID  . isosorbide mononitrate  30 mg Oral Daily  . mouth rinse  15 mL Mouth Rinse BID  . sodium chloride flush  3 mL Intravenous Q12H  . tamsulosin  0.4 mg Oral QPC supper   Continuous Infusions: . sodium chloride    . ceFEPime (MAXIPIME) IV       LOS: 5 days    Time spent: More than 50% of that time was spent in counseling and/or coordination of care.      Antonieta Pert, MD [facility] Pager [phone]  If 7PM-7AM, please contact night-coverage [email or web address] Password [facility] 03/30/2018, 11:43 AM   Please Note: This patient record was dictated using Editor, commissioning. Chart creation errors have been sought, but may not always have been located. Such creation errors do not reflect on the Standard of Medical Care.

## 2018-03-30 NOTE — Progress Notes (Signed)
Pt wants to see an orthopedic doctor "right this minute" for his right hand swelling. Pt has a good capillary refill, +2 radial pulse, able to move his fingers. No more swelling noted compare to the last night. Pt's arm is elevated. Pt is advised to wait patiently for the doctor's rounding.

## 2018-03-30 NOTE — Plan of Care (Signed)

## 2018-03-30 NOTE — Progress Notes (Signed)
CSW reached out to [facility] to assist patient with his groceries at home and financial budgeting that patient reports he struggles with.   Felicia with [facility] will be visiting patient at 10:00 am tomorrow 03/31/18 to provide patient with assistance for community resources. Patient notified and accepting.   [place], [place]

## 2018-03-31 ENCOUNTER — Ambulatory Visit: Payer: Self-pay | Admitting: Hematology

## 2018-03-31 ENCOUNTER — Other Ambulatory Visit: Payer: Self-pay

## 2018-03-31 ENCOUNTER — Inpatient Hospital Stay (HOSPITAL_COMMUNITY): Payer: Medicare Other

## 2018-03-31 DIAGNOSIS — M7989 Other specified soft tissue disorders: Secondary | ICD-10-CM

## 2018-03-31 LAB — CBC WITH DIFFERENTIAL/PLATELET
BAND NEUTROPHILS: 6 %
Basophils Absolute: 1.2 10*3/uL — ABNORMAL HIGH (ref 0.0–0.1)
Basophils Relative: 5 %
EOS ABS: 0.7 10*3/uL — AB (ref 0.0–0.5)
Eosinophils Relative: 3 %
HEMATOCRIT: 25.2 % — AB (ref 39.0–52.0)
Hemoglobin: 7.5 g/dL — ABNORMAL LOW (ref 13.0–17.0)
LYMPHS ABS: 4.2 10*3/uL — AB (ref 0.7–4.0)
Lymphocytes Relative: 17 %
MCH: 25.2 pg — ABNORMAL LOW (ref 26.0–34.0)
MCHC: 29.8 g/dL — AB (ref 30.0–36.0)
MCV: 84.6 fL (ref 80.0–100.0)
METAMYELOCYTES PCT: 4 %
Monocytes Absolute: 1.2 10*3/uL — ABNORMAL HIGH (ref 0.1–1.0)
Monocytes Relative: 5 %
Myelocytes: 4 %
NEUTROS ABS: 17.2 10*3/uL — AB (ref 1.7–7.7)
NEUTROS PCT: 56 %
NRBC: 5 /100{WBCs} — AB
PLATELETS: 127 10*3/uL — AB (ref 150–400)
RBC: 2.98 MIL/uL — ABNORMAL LOW (ref 4.22–5.81)
RDW: 17.2 % — AB (ref 11.5–15.5)
WBC: 24.5 10*3/uL — ABNORMAL HIGH (ref 4.0–10.5)
nRBC: 2.8 % — ABNORMAL HIGH (ref 0.0–0.2)

## 2018-03-31 LAB — BASIC METABOLIC PANEL
Anion gap: 7 (ref 5–15)
BUN: 58 mg/dL — ABNORMAL HIGH (ref 8–23)
CALCIUM: 9.1 mg/dL (ref 8.9–10.3)
CO2: 21 mmol/L — AB (ref 22–32)
CREATININE: 1.51 mg/dL — AB (ref 0.61–1.24)
Chloride: 108 mmol/L (ref 98–111)
GFR calc non Af Amer: 43 mL/min — ABNORMAL LOW (ref >60)
GFR, EST AFRICAN AMERICAN: 50 mL/min — AB (ref >60)
GLUCOSE: 117 mg/dL — AB (ref 70–99)
Potassium: 5.2 mmol/L — ABNORMAL HIGH (ref 3.5–5.1)
Sodium: 136 mmol/L (ref 135–145)

## 2018-03-31 MED ORDER — SODIUM CHLORIDE 0.9 % IV SOLN
250.0000 mL | INTRAVENOUS | Status: DC
  Administered 2018-03-31 (×2): 250 mL via INTRAVENOUS

## 2018-03-31 NOTE — Progress Notes (Signed)
PROGRESS NOTE    Juan [name]  PJA:[id number] DOB: [birth year] DOA: 03/24/2018 PCP: Colon Branch, MD   Brief Narrative: "[name] y.o.malewith medical history significant ofallergic rhinitis, anemia, ascending aortic aneurysm, CAD, chronic diastolic CHF, chronic respiratory failure on home oxygen, stage III chronic kidney disease, chronic lower extremities edema, hemorrhoids, history of hydronephrosis, history of splenic infarct, history of thrombocytosis, hypertension, iron deficiency, migraine headaches, moderate to severe pulmonary hypertension, myeloproliferative disease, sleep apnea not on CPAP, type 2 diabetes,Recurrent Pseudomonas UTI whopresents with urinary symptoms recent concerns about yet another episode of UTI"  Assessment & Plan:   Acute lower UTI/H/o Pseudomonas UTI, hx of Complicated UTI: No oral alternatives as Pseudomonas resistant to ciprofloxacin.  Continue on cefepime for now, as patient has worsening leukocytosis, could be secondary to steroid but in the setting of Pseudomonas UTI will continue on antibiotics. He is hemodynamically stable, afebrile.  Acute gout flareup of the second metacarpophalangeal joint: he has worsening rt hand swelling, also with pain: Felt to be from gout.  Prednisone 60 mg x 3 days received, on colchicine.  Right hand swelling is improving with elevation.  Range of motion the small joints of the hand and the wrist intact and no obvious fluctuation, redness and point tenderness- doubt infection.  Duplex right arm  NEGATIVE for DVT on preliminary report.X-ray of the hand showed arthropathic changes at the second metacarpal phalangeal joint with soft tissue swelling that extends to the proximal index finger. His rt arm neurovascular status is intact.  Removed IV line from the right arm in place in the left arm. Cont to elevate rt arm. If does not improve we can c/s ortho  Leukocytosis: No milliseconds 24,500 today from 11k-->16k-->19.8k->24.5k.   But patient is afebrile. he did receive prednisone for gout which could contribute.  Right hand without erythema fluctuation and diminished range of motion-doubt infection.  Urine grew Pseudomonas, patient on cefepime and will be continued.  Last temp spike was on 03/28/18 is no growth so far. Repeat CBC in am.  BPH contributing to UTI.  Contin Flomax.  CKD (chronic kidney disease) stage 3, GFR 30-59 ml/min" baseline creat ~ 1.3-1.6, Creat 9 improving 1.7-1.5, continue gentle IV fluids.   Mild hyperkalemia, monitor labs.   Type II diabetes mellitus hx. controlled, last hemoglobin A1c 5.1 07/20/1935  Chronic diastolic CHF appears compensated, on Lasix will hold for now.  Chest x-ray with chronic diffuse bilateral interstitial and groundglass opacity. BNP in am.  Myelodysplasia/myelofibrosis: Follow-up with this physician.  Hemoglobin 7-8 grams stable, with thrombocytopenia, plat at 118k-127k. monitor  HTN: BP stable, continue hydralazine, Imdur.  DVT prophylaxis: Subcu heparin. Code Status: Full code Family Communication: Patient updated with plan of care and in agreement. Disposition Plan: Home next 1-2 days if remains stable.  Patient has declined SNF.  He has a son at home to help w ADL.  Subjective: Right hand swelling is better today but still having pain.  No fever nausea vomiting chest pain.  Objective: Vitals:   03/31/18 0610 03/31/18 1337 03/31/18 1434 03/31/18 1440  BP: (!) 157/75 (!) 121/53 129/61 129/61  Pulse: 79 60  (!) 57  Resp: 18 18    Temp: 98.3 F (36.8 C)     TempSrc: Oral     SpO2: 97% 99%      Intake/Output Summary (Last 24 hours) at 03/31/2018 1459 Last data filed at 03/31/2018 1425 Gross per 24 hour  Intake 1856.06 ml  Output 1750 ml  Net 106.06 ml  There were no vitals filed for this visit. Weight change:   There is no height or weight on file to calculate BMI.  Examination:  General exam: Calm, comfortable ,NAD HEENT:Oral mucosa moist,  Ear/Nose WNL grossly Respiratory system: Bilateral equal air entry, no crackles and wheezing Cardiovascular system: S1 & S2 heard, No JVD/murmurs.No pedal edema. Gastrointestinal system: Abdomen soft, nontender non-distended, BS +. Nervous System:Alert/awake/oriented at baseline.  Able to move UE and LE Extremities: Edema on the right upper extremity mostly hand which is improving without erythema, fluctuating swelling.  Mild tenderness present but  ROM of small joints of hands and wrist intact, chronic appearing B/L edema W Ace wrap,distal peripheral pulses palpable. Skin: No rashes,no icterus. MSK: Normal muscle bulk,tone ,power   Data Reviewed: I have personally reviewed following labs and imaging studies  CBC: Recent Labs  Lab 03/25/18 0143 03/25/18 0825 03/27/18 0223 03/28/18 0807 03/30/18 0205 03/31/18 0211  WBC 11.9* 10.7* 16.0* 18.5* 19.8* 24.5*  NEUTROABS 3.4  --   --   --   --  17.2*  HGB 8.1* 7.9* 7.8* 8.2* 7.3* 7.5*  HCT 27.5* 26.9* 26.3* 28.9* 25.9* 25.2*  MCV 86.2 85.1 85.4 85.3 84.9 84.6  PLT 163 159 142* 125* 118* 053*   Basic Metabolic Panel: Recent Labs  Lab 03/25/18 0025 03/25/18 0035 03/25/18 0825 03/27/18 0223 03/30/18 0205 03/31/18 0211  NA 139 138 141 138 134* 136  K 5.2* 5.1 4.0 4.2 5.0 5.2*  CL 107 107 108 108 106 108  CO2 23  --  25 23 21* 21*  GLUCOSE 98 96 100* 133* 130* 117*  BUN 54* 67* 52* 36* 51* 58*  CREATININE 1.35* 1.50* 1.33* 1.21 1.72* 1.51*  CALCIUM 9.2  --  9.4 9.3 9.1 9.1   GFR: CrCl cannot be calculated (Unknown ideal weight.). Liver Function Tests: No results for input(s): AST, ALT, ALKPHOS, BILITOT, PROT, ALBUMIN in the last 168 hours. No results for input(s): LIPASE, AMYLASE in the last 168 hours. No results for input(s): AMMONIA in the last 168 hours. Coagulation Profile: No results for input(s): INR, PROTIME in the last 168 hours. Cardiac Enzymes: No results for input(s): CKTOTAL, CKMB, CKMBINDEX, TROPONINI in the  last 168 hours. BNP (last 3 results) No results for input(s): PROBNP in the last 8760 hours. HbA1C: No results for input(s): HGBA1C in the last 72 hours. CBG: No results for input(s): GLUCAP in the last 168 hours. Lipid Profile: No results for input(s): CHOL, HDL, LDLCALC, TRIG, CHOLHDL, LDLDIRECT in the last 72 hours. Thyroid Function Tests: No results for input(s): TSH, T4TOTAL, FREET4, T3FREE, THYROIDAB in the last 72 hours. Anemia Panel: No results for input(s): VITAMINB12, FOLATE, FERRITIN, TIBC, IRON, RETICCTPCT in the last 72 hours. Sepsis Labs: No results for input(s): PROCALCITON, LATICACIDVEN in the last 168 hours.  Recent Results (from the past 240 hour(s))  Urine culture     Status: Abnormal   Collection Time: 03/25/18 12:25 AM  Result Value Ref Range Status   Specimen Description URINE, CLEAN CATCH  Final   Special Requests   Final    NONE Performed at [facility], [address], [place], [place] [address]    Culture >=100,000 COLONIES/mL PSEUDOMONAS AERUGINOSA (A)  Final   Report Status 03/27/2018 FINAL  Final   Organism ID, Bacteria PSEUDOMONAS AERUGINOSA (A)  Final      Susceptibility   Pseudomonas aeruginosa - MIC*    CEFTAZIDIME 4 SENSITIVE Sensitive     CIPROFLOXACIN >=4 RESISTANT Resistant  GENTAMICIN 4 SENSITIVE Sensitive     IMIPENEM 2 SENSITIVE Sensitive     PIP/TAZO 8 SENSITIVE Sensitive     CEFEPIME 8 SENSITIVE Sensitive     * >=100,000 COLONIES/mL PSEUDOMONAS AERUGINOSA  Culture, blood (Routine X 2) w Reflex to ID Panel     Status: None   Collection Time: 03/25/18  5:53 AM  Result Value Ref Range Status   Specimen Description BLOOD RIGHT ARM  Final   Special Requests   Final    BOTTLES DRAWN AEROBIC ONLY Blood Culture results may not be optimal due to an inadequate volume of blood received in culture bottles   Culture   Final    NO GROWTH 5 DAYS Performed at [facility], [place] [address], [place], [place] [address]    Report  Status 03/30/2018 FINAL  Final  Culture, blood (Routine X 2) w Reflex to ID Panel     Status: None (Preliminary result)   Collection Time: 03/28/18  8:25 AM  Result Value Ref Range Status   Specimen Description BLOOD LEFT HAND  Final   Special Requests   Final    BOTTLES DRAWN AEROBIC ONLY Blood Culture adequate volume   Culture   Final    NO GROWTH 3 DAYS Performed at [facility], [place] [address], [place], [place] [address]    Report Status PENDING  Incomplete  Culture, blood (Routine X 2) w Reflex to ID Panel     Status: None (Preliminary result)   Collection Time: 03/28/18  8:25 AM  Result Value Ref Range Status   Specimen Description BLOOD LEFT HAND  Final   Special Requests   Final    BOTTLES DRAWN AEROBIC ONLY Blood Culture adequate volume   Culture   Final    NO GROWTH 3 DAYS Performed at [facility], [place] [address], [place], [place] [address]    Report Status PENDING  Incomplete      Radiology Studies: No results found.      Scheduled Meds: . aspirin EC  325 mg Oral Daily  . B-complex with vitamin C  1 tablet Oral Daily  . colchicine  0.6 mg Oral BID  . ferrous gluconate  324 mg Oral Q breakfast  . heparin  5,000 Units Subcutaneous Q8H  . hydrALAZINE  100 mg Oral Q8H  . hydrocortisone cream   Topical QID  . isosorbide mononitrate  30 mg Oral Daily  . mouth rinse  15 mL Mouth Rinse BID  . sodium chloride flush  3 mL Intravenous Q12H  . tamsulosin  0.4 mg Oral QPC supper   Continuous Infusions: . sodium chloride 250 mL (03/31/18 0830)  . ceFEPime (MAXIPIME) IV 2 g (03/30/18 1607)     LOS: 6 days    Time spent: More than 50% of that time was spent in counseling and/or coordination of care.      Antonieta Pert, MD [facility] Pager [phone]  If 7PM-7AM, please contact night-coverage [email or web address] Password [email or web address] 03/31/2018, 2:59 PM

## 2018-03-31 NOTE — Care Management Important Message (Signed)
Important Message  Patient Details  Name: Juan [name] MRN: [id number] Date of Birth: 1940-11-03   Medicare Important Message Given:  Yes    Erenest Rasher, RN 03/31/2018, 1:50 PM

## 2018-03-31 NOTE — Progress Notes (Signed)
Right upper extremity venous duplex has been completed. Negative for DVT.  03/31/18 11:22 AM Juan [name] RVT

## 2018-03-31 NOTE — Progress Notes (Signed)
PT Cancellation Note  Patient Details Name: Juan [name] MRN: [id number] DOB: September 03, 1940   Cancelled Treatment:    Reason Eval/Treat Not Completed: Patient declined, no reason specified Patient declined participating in therapy and reports he does not want to "do much" until his R hand gets better. Pt educated on benefits of OOB mobility. PT will continue to follow acutely.    Earney Navy, PTA Acute Rehabilitation Services Pager: [phone] Office: [phone]   Darliss Cheney 03/31/2018, 9:34 AM

## 2018-03-31 NOTE — Care Management Note (Addendum)
Case Management Note  Patient Details  Name: Juan [name] MRN: [id number] Date of Birth: Oct 14, 1940  Subjective/Objective:   Acute UTI, acute gout flare                Action/Plan: NCM spoke to pt and son at bedside. Explained to pt the importance of SNF or HH at dc to assist with care once dc from hospital. 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  Son is in the home but assistance with ADLs is limited. Pt reports his son is in and out. He has some food but not much. Explained HH SW can assist with getting meals on wheels, and other resources in community. Pt declined SNF, HH, [facility] and [facility]. States he can manage at home. Pt reports having RW and bedside commode at home.   Pt will dc home via [facility].   Expected Discharge Date:                  Expected Discharge Plan:  Home/Self Care  In-House Referral:  Clinical Social Work  Discharge planning Services  CM Consult  Post Acute Care Choice:  NA Choice offered to:  NA  DME Arranged:  N/A DME Agency:  NA  HH Arranged:  Patient Refused HH, Refused SNF [place] Agency:  NA  Status of Service:  Completed, signed off  If discussed at [place] of Stay Meetings, dates discussed:    Additional Comments:  Erenest Rasher, RN 03/31/2018, 1:44 PM

## 2018-04-01 LAB — BASIC METABOLIC PANEL
Anion gap: 6 (ref 5–15)
BUN: 57 mg/dL — ABNORMAL HIGH (ref 8–23)
CALCIUM: 9 mg/dL (ref 8.9–10.3)
CO2: 22 mmol/L (ref 22–32)
CREATININE: 1.64 mg/dL — AB (ref 0.61–1.24)
Chloride: 108 mmol/L (ref 98–111)
GFR calc Af Amer: 45 mL/min — ABNORMAL LOW (ref >60)
GFR calc non Af Amer: 39 mL/min — ABNORMAL LOW (ref >60)
GLUCOSE: 91 mg/dL (ref 70–99)
Potassium: 5 mmol/L (ref 3.5–5.1)
Sodium: 136 mmol/L (ref 135–145)

## 2018-04-01 LAB — CBC
HEMATOCRIT: 25.3 % — AB (ref 39.0–52.0)
Hemoglobin: 7.4 g/dL — ABNORMAL LOW (ref 13.0–17.0)
MCH: 25 pg — ABNORMAL LOW (ref 26.0–34.0)
MCHC: 29.2 g/dL — ABNORMAL LOW (ref 30.0–36.0)
MCV: 85.5 fL (ref 80.0–100.0)
PLATELETS: 139 10*3/uL — AB (ref 150–400)
RBC: 2.96 MIL/uL — ABNORMAL LOW (ref 4.22–5.81)
RDW: 17.4 % — AB (ref 11.5–15.5)
WBC: 22.6 10*3/uL — AB (ref 4.0–10.5)
nRBC: 4.8 % — ABNORMAL HIGH (ref 0.0–0.2)

## 2018-04-01 LAB — PATHOLOGIST SMEAR REVIEW

## 2018-04-01 LAB — BRAIN NATRIURETIC PEPTIDE: B Natriuretic Peptide: 92.4 pg/mL (ref 0.0–100.0)

## 2018-04-01 MED ORDER — SODIUM CHLORIDE 0.9 % IV SOLN
250.0000 mL | INTRAVENOUS | Status: DC
  Administered 2018-04-01: 250 mL via INTRAVENOUS

## 2018-04-01 NOTE — Progress Notes (Signed)
PROGRESS NOTE    Juan [name]  VQQ:[id number] DOB: 11/19/1940 DOA: 03/24/2018 PCP: Colon Branch, MD   Brief Narrative: 77 year old male with past medical history of allergic rhinitis, anemia, ascending aortic aneurysm, CAD, chronic diastolic CHF, chronic respiratory failure on home oxygen, CKD stage III, chronic lower extremity edema, hemorrhoids, history of hydronephrosis, history of splenic infarct, thrombocytosis, essential hypertension, iron deficiency anemia, migraine headache, moderate to severe pulmonary hypertension, myeloproliferative disease, sleep apnea not on CPAP, type 2 diabetes, recurrent Pseudomonas UTI presented with urinary symptoms. Found to have Pseudomonas UTI.  Patient also has right hand swelling and pain and being treated as acute gouty flareup which is improving.  Assessment & Plan:   Acute lower UTI w recurrent Pseudomonas UTI, hx of Complicated UTI: No oral alternatives as Pseudomonas resistant to ciprofloxacin.Continue on cefepime. Wbc now trending down 24k->22.6k. Afebrile.  Acute gout flareup of the second metacarpophalangeal joint: he had worsening rt hand swelling, also with pain: Felt to be from gout.  S/p Prednisone 60 mg x 3 days. Now on colchicine. Right hand swelling is much improved, he still with some tenderness, continue colchicine.  Improving with elevation. ROM intact. Duplex right arm  NEGATIVE for DVT.X-ray of the hand showed arthropathic changes at the second metacarpal phalangeal joint with soft tissue swelling that extends to the proximal index finger. His rt arm neurovascular status is intact.  Removed IV line from the right arm in place in the left arm. Cont to elevate rt arm. If does not improve we can c/s ortho  Leukocytosis: wbc trends:11k-->16k-->19.8k->24.5k-->22.6k. Afebrile.  Suspect secondary to steroid use. But also has UTI. Right hand without erythema fluctuation and normal range of motion-doubt infection and swelling is much improved after  elevation. On cefepime and will be continued unntil d/c home. Last temp spike was on 03/28/18 is no growth so far. Repeat CBC improving  BPH contributing to UTI.  Contin Flomax.  CKD (chronic kidney disease) stage 3, GFR 30-59 ml/min" baseline creat ~ 1.3-1.6, Creat at 1.7-1.5. bnp normal and tolerating gentle ivf.  Mild hyperkalemia, monitor labs. Cont low k diet.   Type II diabetes mellitus hx. controlled, last hemoglobin A1c 5.1 08/18/3293  Chronic diastolic CHF appears compensated, on Lasix will hold for now. Chr b/l leg edeam cont ace wrap. Recent duplex this month neg for dvt. Chest x-ray with chronic diffuse bilateral interstitial and groundglass opacity. BNP is normal, tolerating gentle ivf we will cut further  Myelodysplasia/myelofibrosis: Follow-up with this physician.  Hemoglobin 7-8 grams stable, with thrombocytopenia, plat at 118k-127k. Monitor. On iron supplement.  HTN: BP controlled. Continue hydralazine, Imdur. CAD- no chest pain, cont asa, imdur  DVT prophylaxis: SQ heparin. Code Status: Full code Family Communication: no family at bedside Disposition Plan:  Home w Home health next 1-2 days if remains stable clinically. Patient has declined SNF.  He has a son at home to help w ADL.  Subjective: Patient's right hand swelling is much better.  Still has some pain.  No nausea vomiting chest pain fever. " I will go home tomorrow and think about home health tomorrow"   Objective: Vitals:   03/31/18 1440 03/31/18 2153 04/01/18 0421 04/01/18 0631  BP: 129/61 115/61 (!) 134/58 (!) 153/72  Pulse: (!) 57 67 60 73  Resp:   18 18  Temp:      TempSrc:      SpO2:  95% 98% 98%    Intake/Output Summary (Last 24 hours) at 04/01/2018 1450 Last data filed at 04/01/2018  0830 Gross per 24 hour  Intake 1141.95 ml  Output -  Net 1141.95 ml   There were no vitals filed for this visit. Weight change:   There is no height or weight on file to calculate  BMI.  Examination:  General exam: Calm, comfortable, NAD HEENT:Oral mucosa moist, Ear/Nose WNL grossly Respiratory system: Bilateral equal air entry, no crackles and wheezing Cardiovascular system: S1 & S2 heard, No JVD/murmurs.No pedal edema. Gastrointestinal system: Abdomen soft, nontender non-distended, BS +. Nervous System:Alert/awake/oriented at baseline.  Able to move UE and LE Extremities: right hand edema-significantly better, some tenderness present but no obvious erythema fluctuation. ROM in had/wrist intact.distal peripheral pulses palpable. Skin: No rashes,no icterus.  Bilateral chronic appearing leg with edema/venous stasis MSK: Normal muscle bulk,tone ,power  Data Reviewed: I have personally reviewed following labs and imaging studies  CBC: Recent Labs  Lab 03/27/18 0223 03/28/18 0807 03/30/18 0205 03/31/18 0211 04/01/18 0158  WBC 16.0* 18.5* 19.8* 24.5* 22.6*  NEUTROABS  --   --   --  17.2*  --   HGB 7.8* 8.2* 7.3* 7.5* 7.4*  HCT 26.3* 28.9* 25.9* 25.2* 25.3*  MCV 85.4 85.3 84.9 84.6 85.5  PLT 142* 125* 118* 127* 562*   Basic Metabolic Panel: Recent Labs  Lab 03/27/18 0223 03/30/18 0205 03/31/18 0211 04/01/18 0158  NA 138 134* 136 136  K 4.2 5.0 5.2* 5.0  CL 108 106 108 108  CO2 23 21* 21* 22  GLUCOSE 133* 130* 117* 91  BUN 36* 51* 58* 57*  CREATININE 1.21 1.72* 1.51* 1.64*  CALCIUM 9.3 9.1 9.1 9.0   GFR: CrCl cannot be calculated (Unknown ideal weight.). Liver Function Tests: No results for input(s): AST, ALT, ALKPHOS, BILITOT, PROT, ALBUMIN in the last 168 hours. No results for input(s): LIPASE, AMYLASE in the last 168 hours. No results for input(s): AMMONIA in the last 168 hours. Coagulation Profile: No results for input(s): INR, PROTIME in the last 168 hours. Cardiac Enzymes: No results for input(s): CKTOTAL, CKMB, CKMBINDEX, TROPONINI in the last 168 hours. BNP (last 3 results) No results for input(s): PROBNP in the last 8760  hours. HbA1C: No results for input(s): HGBA1C in the last 72 hours. CBG: No results for input(s): GLUCAP in the last 168 hours. Lipid Profile: No results for input(s): CHOL, HDL, LDLCALC, TRIG, CHOLHDL, LDLDIRECT in the last 72 hours. Thyroid Function Tests: No results for input(s): TSH, T4TOTAL, FREET4, T3FREE, THYROIDAB in the last 72 hours. Anemia Panel: No results for input(s): VITAMINB12, FOLATE, FERRITIN, TIBC, IRON, RETICCTPCT in the last 72 hours. Sepsis Labs: No results for input(s): PROCALCITON, LATICACIDVEN in the last 168 hours.  Recent Results (from the past 240 hour(s))  Urine culture     Status: Abnormal   Collection Time: 03/25/18 12:25 AM  Result Value Ref Range Status   Specimen Description URINE, CLEAN CATCH  Final   Special Requests   Final    NONE Performed at [facility], [address] [place], [place] [address]    Culture >=100,000 COLONIES/mL PSEUDOMONAS AERUGINOSA (A)  Final   Report Status 03/27/2018 FINAL  Final   Organism ID, Bacteria PSEUDOMONAS AERUGINOSA (A)  Final      Susceptibility   Pseudomonas aeruginosa - MIC*    CEFTAZIDIME 4 SENSITIVE Sensitive     CIPROFLOXACIN >=4 RESISTANT Resistant     GENTAMICIN 4 SENSITIVE Sensitive     IMIPENEM 2 SENSITIVE Sensitive     PIP/TAZO 8 SENSITIVE Sensitive  CEFEPIME 8 SENSITIVE Sensitive     * >=100,000 COLONIES/mL PSEUDOMONAS AERUGINOSA  Culture, blood (Routine X 2) w Reflex to ID Panel     Status: None   Collection Time: 03/25/18  5:53 AM  Result Value Ref Range Status   Specimen Description BLOOD RIGHT ARM  Final   Special Requests   Final    BOTTLES DRAWN AEROBIC ONLY Blood Culture results may not be optimal due to an inadequate volume of blood received in culture bottles   Culture   Final    NO GROWTH 5 DAYS Performed at [facility], [place] [address], [place], [place] [address]    Report Status 03/30/2018 FINAL  Final  Culture, blood (Routine X 2) w Reflex to ID Panel      Status: None (Preliminary result)   Collection Time: 03/28/18  8:25 AM  Result Value Ref Range Status   Specimen Description BLOOD LEFT HAND  Final   Special Requests   Final    BOTTLES DRAWN AEROBIC ONLY Blood Culture adequate volume   Culture   Final    NO GROWTH 4 DAYS Performed at [facility], [place] [address] [place], [place] [address]    Report Status PENDING  Incomplete  Culture, blood (Routine X 2) w Reflex to ID Panel     Status: None (Preliminary result)   Collection Time: 03/28/18  8:25 AM  Result Value Ref Range Status   Specimen Description BLOOD LEFT HAND  Final   Special Requests   Final    BOTTLES DRAWN AEROBIC ONLY Blood Culture adequate volume   Culture   Final    NO GROWTH 4 DAYS Performed at [facility], [place] [address], [place], [place] [address]    Report Status PENDING  Incomplete  Radiology Studies: Vas [place] Upper Extremity Venous Duplex  Result Date: 03/31/2018 UPPER VENOUS STUDY  Indications: Swelling Performing Technologist: Oliver Hum RVT  Examination Guidelines: A complete evaluation includes B-mode imaging, spectral Doppler, color Doppler, and power Doppler as needed of all accessible portions of each vessel. Bilateral testing is considered an integral part of a complete examination. Limited examinations for reoccurring indications may be performed as noted.  Right Findings: +----------+------------+----------+---------+-----------+-------+ RIGHT     CompressiblePropertiesPhasicitySpontaneousSummary +----------+------------+----------+---------+-----------+-------+ IJV           Full                 Yes       Yes            +----------+------------+----------+---------+-----------+-------+ Subclavian    Full                 Yes       Yes            +----------+------------+----------+---------+-----------+-------+ Axillary      Full                 Yes       Yes             +----------+------------+----------+---------+-----------+-------+ Brachial      Full                 Yes       Yes            +----------+------------+----------+---------+-----------+-------+ Radial        Full                                          +----------+------------+----------+---------+-----------+-------+  Ulnar         Full                                          +----------+------------+----------+---------+-----------+-------+ Cephalic      Full                                          +----------+------------+----------+---------+-----------+-------+ Basilic       Full                                          +----------+------------+----------+---------+-----------+-------+  Left Findings: +----------+------------+----------+---------+-----------+-------+ LEFT      CompressiblePropertiesPhasicitySpontaneousSummary +----------+------------+----------+---------+-----------+-------+ Subclavian    Full                 Yes       Yes            +----------+------------+----------+---------+-----------+-------+  Summary:  Right: No evidence of deep vein thrombosis in the upper extremity. No evidence of superficial vein thrombosis in the upper extremity.  Left: No evidence of thrombosis in the subclavian.  *See table(s) above for measurements and observations.  Diagnosing physician: Monica Martinez MD Electronically signed by Monica Martinez MD on 03/31/2018 at 4:34:35 PM.    Final    Scheduled Meds: . aspirin EC  325 mg Oral Daily  . B-complex with vitamin C  1 tablet Oral Daily  . colchicine  0.6 mg Oral BID  . ferrous gluconate  324 mg Oral Q breakfast  . heparin  5,000 Units Subcutaneous Q8H  . hydrALAZINE  100 mg Oral Q8H  . hydrocortisone cream   Topical QID  . isosorbide mononitrate  30 mg Oral Daily  . mouth rinse  15 mL Mouth Rinse BID  . sodium chloride flush  3 mL Intravenous Q12H  . tamsulosin  0.4 mg Oral QPC supper    Continuous Infusions: . sodium chloride 250 mL (03/31/18 1828)  . ceFEPime (MAXIPIME) IV 2 g (03/31/18 1828)     LOS: 7 days   Time spent: More than 50% of that time was spent in counseling and/or coordination of care. Antonieta Pert, MD [facility] Pager [phone]  If 7PM-7AM, please contact night-coverage [email or web address] Password [email or web address] 04/01/2018, 2:50 PM

## 2018-04-02 DIAGNOSIS — M79641 Pain in right hand: Secondary | ICD-10-CM

## 2018-04-02 DIAGNOSIS — R338 Other retention of urine: Secondary | ICD-10-CM

## 2018-04-02 DIAGNOSIS — N401 Enlarged prostate with lower urinary tract symptoms: Secondary | ICD-10-CM

## 2018-04-02 DIAGNOSIS — N390 Urinary tract infection, site not specified: Principal | ICD-10-CM

## 2018-04-02 DIAGNOSIS — D469 Myelodysplastic syndrome, unspecified: Secondary | ICD-10-CM

## 2018-04-02 LAB — CBC
HEMATOCRIT: 29.1 % — AB (ref 39.0–52.0)
Hemoglobin: 8.1 g/dL — ABNORMAL LOW (ref 13.0–17.0)
MCH: 24.1 pg — AB (ref 26.0–34.0)
MCHC: 27.8 g/dL — ABNORMAL LOW (ref 30.0–36.0)
MCV: 86.6 fL (ref 80.0–100.0)
PLATELETS: 209 10*3/uL (ref 150–400)
RBC: 3.36 MIL/uL — ABNORMAL LOW (ref 4.22–5.81)
RDW: 17.8 % — ABNORMAL HIGH (ref 11.5–15.5)
WBC: 28.9 10*3/uL — ABNORMAL HIGH (ref 4.0–10.5)
nRBC: 3.3 % — ABNORMAL HIGH (ref 0.0–0.2)

## 2018-04-02 LAB — CULTURE, BLOOD (ROUTINE X 2)
CULTURE: NO GROWTH
Culture: NO GROWTH
SPECIAL REQUESTS: ADEQUATE
Special Requests: ADEQUATE

## 2018-04-02 MED ORDER — PREDNISONE 20 MG PO TABS: 20.0000 mg | ORAL_TABLET | Freq: Every day | ORAL | 0 refills | Status: DC

## 2018-04-02 NOTE — Progress Notes (Signed)
Pt refusing to wear O2 at this time. States "I don't need it now".

## 2018-04-02 NOTE — Discharge Summary (Signed)
Physician Discharge Summary  Juan [name] HKV:[id number] DOB: Apr 30, 1941 DOA: 03/24/2018  PCP: Colon Branch, MD  Admit date: 03/24/2018 Discharge date: 04/02/2018  Recommendations for Outpatient Follow-up:   Suspected gout second metacarpal phalangeal joint. Treated with colchicine and prednisone.  X-ray showed arthropathic changes second metacarpal phalangeal joint soft tissue swelling.  Right upper extremity venous duplex was negative for DVT. --Exam benign.  Some swelling may be related to previous IV site as well.  There is no evidence of infection.  Complete a course of prednisone as an outpatient.  Follow-up with PCP.  Thrombocytopenia (resolved), PMH chronic myeloproliferative disorder, chronic leukocytosis since at least 2015.   Follow-up Information    Colon Branch, MD. Schedule an appointment as soon as possible for a visit in 1 week(s).   Specialty:  Internal Medicine Contact information: [address] [place] [address] [phone]        [name], MD .   Specialty:  Cardiology Contact information: [address] [place] [address] [phone]            Discharge Diagnoses:  1. Pseudomonas UTI sensitive to cefepime 2. BPH 3. Suspected gout second metacarpal phalangeal joint. CKD stage II-III 4. Generalized weakness, debility 5. Moderate to severe pulmonary hypertension 6. Chronic diastolic CHF 7. Chronic hypoxic respiratory failure on 2 L nasal cannula 8. Diabetes mellitus type 2 9. Chronic iron deficiency anemia 10. Thrombocytopenia (resolved), PMH chronic myeloproliferative disorder, chronic leukocytosis  Discharge Condition: improved Disposition: home   Diet recommendation: heart healthy, diabetic diet  History of present illness:  77 year old man PMH chronic diastolic CHF, chronic respiratory failure on home oxygen, stage III chronic kidney disease chronic lower extremity edema, thrombocytosis,  moderate to severe pulmonary hypertension, diabetes mellitus type 2, recurrent Pseudomonas UTI, who presented with urinary symptoms including abdominal discomfort and dysuria as well as bilateral lower extremity edema.  Admitted 10/23-10/29 for UTI.    Hospital Course:  Patient treated for Pseudomonas UTI with cefepime.  Treated for suspected acute gout flare with colchicine and prednisone. Physical therapy recommended skilled nursing facility, patient refused.  Patient also refused home health.  Individual issues as below.  Pseudomonas UTI sensitive to cefepime.  Treated with prolonged course of cefepime.  Leukocytosis is chronic and the patient has a suspected underlying myeloproliferative disorder, would expect leukocytosis to continue and therefore it is of little value as a guide to infection of which there is no evidence at this time.  Note patient also treated with prednisone. --Discontinue antibiotics.  BPH --Continue Flomax.    Suspected gout second metacarpal phalangeal joint. Treated with colchicine and prednisone.  X-ray showed arthropathic changes second metacarpal phalangeal joint soft tissue swelling.  Right upper extremity venous duplex was negative for DVT. --Exam benign.  Some swelling may be related to previous IV site as well.  There is no evidence of infection.  Complete a course of prednisone as an outpatient.  Follow-up with PCP.  CKD stage II-III -- Appears to fluctuate over time but overall appears stable.  Follow-up as an outpatient.  Generalized weakness, debility.  PT recommended SNF but patient refused all forms of assistance.  Moderate to severe pulmonary hypertension --Appears asymptomatic.  Chronic diastolic CHF.  Bilateral lower extremity venous Dopplers negative for DVT.  Edema thought secondary to amlodipine. --Amlodipine stopped. --Resume Lasix on discharge.  Chronic hypoxic respiratory failure on 2 L nasal cannula --Stable.  Diabetes mellitus  type 2, hemoglobin A1c 5.16 August 2017. -- Diet-controlled.  Chronic iron deficiency anemia followed at [facility]; treated with Procrit as needed.    Thrombocytopenia (resolved), PMH chronic myeloproliferative disorder, chronic leukocytosis since at least 2015.  Antimicrobials: Cefepime 11/7-11/15  Today's assessment: S: "I want to go home today".  Feels better.  Right hand swelling improved.  Eating fine. O: Vitals:  Vitals:   04/01/18 2200 04/02/18 0718  BP: 140/63 (!) 147/70  Pulse: 86 83  Resp: 19 18  Temp: 98.3 F (36.8 C)   SpO2: 93% 99%    Constitutional:  . Appears calm and comfortable Eyes:  . pupils and irises appear normal ENMT:  . grossly normal hearing  Respiratory:  . CTA bilaterally, no w/r/r.  . Respiratory effort normal. Cardiovascular:  . RRR, no m/r/g . 1-2+ bilateral LE extremity edema   Musculoskeletal:  . Moves all extremities. . Right hand edema most prominent over the ulnar portion of the hand.  Nontender.  Skin without erythema.  Range of movement of the hand appears within normal limits.  Strong radial pulse 2+.  Perfusion of the fingers appears intact.  Forearm slightly edematous but appears unremarkable.  Old IV site appears to be healing in the [facility] Psychiatric:  . Mental status o Mood, affect appropriate  Data:  Labs reviewed.    WBC 28.9.  Review of record demonstrates consistent leukocytosis since at least 2015.  Platelets within normal limits.  X-ray reviewed.  Discharge Instructions  Discharge Instructions    Diet - low sodium heart healthy   Complete by:  As directed    Diet Carb Modified   Complete by:  As directed    Discharge instructions   Complete by:  As directed    Call your physician or seek immediate medical attention for pain, swelling, fever, shortness of breath or worsening of condition.   Increase activity slowly   Complete by:  As directed      Allergies as of 04/02/2018   No Known  Allergies     Medication List    STOP taking these medications   amLODipine 5 MG tablet Commonly known as:  NORVASC     TAKE these medications   acetaminophen 325 MG tablet Commonly known as:  TYLENOL Take 2 tablets (650 mg total) by mouth every 6 (six) hours as needed for mild pain (or Fever >/= 101).   aspirin EC 325 MG tablet Take 325 mg by mouth daily.   b complex vitamins tablet Take 1 tablet by mouth daily.   benzonatate 100 MG capsule Commonly known as:  TESSALON Take 1 capsule (100 mg total) by mouth 3 (three) times daily as needed for cough.   clotrimazole 1 % cream Commonly known as:  LOTRIMIN Apply to Lt foot (between) Toes bid for athletes foot What changed:    how much to take  how to take this  when to take this  additional instructions   ferrous gluconate 240 (27 FE) MG tablet Commonly known as:  FERGON Take 240 mg by mouth daily.   furosemide 40 MG tablet Commonly known as:  LASIX Take 80 mg (2 tab) in morning and 40mg  (1tab) in evening What changed:    how much to take  how to take this  when to take this  additional instructions   hydrALAZINE 50 MG tablet Commonly known as:  APRESOLINE Take 1.5 tablets (75 mg total) by mouth 3 (three) times daily.   miconazole 2 % powder Commonly known as:  MICOTIN Apply 1 application topically as needed for itching.   mouth rinse Liqd solution 15 mLs by Mouth Rinse route 2 (two) times daily.   ondansetron 4 MG tablet Commonly known as:  ZOFRAN Take 1 tablet (4 mg total) by mouth every 6 (six) hours as needed for nausea or vomiting.   OXYGEN Inhale 2 L into the lungs continuous.   predniSONE 20 MG tablet Commonly known as:  DELTASONE Take 1 tablet (20 mg total) by mouth daily.   tamsulosin 0.4 MG Caps capsule Commonly known as:  FLOMAX Take 1 capsule (0.4 mg total) by mouth daily after supper.      No Known Allergies  The results of significant diagnostics from this hospitalization  (including imaging, microbiology, ancillary and laboratory) are listed below for reference.    Significant Diagnostic Studies: Dg Chest 2 View  Result Date: 03/24/2018 CLINICAL DATA:  Shortness of breath EXAM: CHEST - 2 VIEW COMPARISON:  03/09/2018, 09/05/2017, 01/23/2017 FINDINGS: Cardiomegaly with vascular congestion and mild diffuse interstitial and ground-glass opacity. No pleural effusion. No pneumothorax. Slight increased atelectasis at the left base. Aortic atherosclerosis. Heterogenous sclerosis within the osseous structures as before. IMPRESSION: 1. Slight increased atelectasis at the left base. 2. Otherwise similar appearance of diffuse bilateral interstitial and ground-glass opacity as compared with multiple prior exams and presumably chronic. 3. Cardiomegaly Electronically Signed   By: Donavan Foil M.D.   On: 03/24/2018 23:59   Dg Hand Complete Right  Result Date: 03/26/2018 CLINICAL DATA:  Right hand pain. EXAM: RIGHT HAND - COMPLETE 3+ VIEW COMPARISON:  None. FINDINGS: No fracture. No bone lesion. Skeletal structures are demineralized. Second metacarpal phalangeal joint space narrowing with mild spurring from the base of the second metacarpal head. Remaining joints normally spaced and aligned. There is soft tissue swelling noted at the base of the index finger. IMPRESSION: 1. No fracture or dislocation. 2. Arthropathic changes at the second metacarpophalangeal joint associated with soft tissue swelling that extends to the proximal index finger. Electronically Signed   By: Lajean Manes M.D.   On: 03/26/2018 16:10   Vas [place] Lower Extremity Venous (dvt)  Result Date: 03/25/2018  Lower Venous Study Indications: Swelling, and Edema.  Performing Technologist: Abram Sander  Examination Guidelines: A complete evaluation includes B-mode imaging, spectral Doppler, color Doppler, and power Doppler as needed of all accessible portions of each vessel. Bilateral testing is considered an integral part  of a complete examination. Limited examinations for reoccurring indications may be performed as noted.  Right Venous Findings: +---------+---------------+---------+-----------+----------+-------+          CompressibilityPhasicitySpontaneityPropertiesSummary +---------+---------------+---------+-----------+----------+-------+ CFV      Full           Yes      Yes                          +---------+---------------+---------+-----------+----------+-------+ SFJ      Full                                                 +---------+---------------+---------+-----------+----------+-------+ FV Prox  Full                                                 +---------+---------------+---------+-----------+----------+-------+  FV Mid   Full                                                 +---------+---------------+---------+-----------+----------+-------+ FV DistalFull                                                 +---------+---------------+---------+-----------+----------+-------+ PFV      Full                                                 +---------+---------------+---------+-----------+----------+-------+ POP      Full           Yes      Yes                          +---------+---------------+---------+-----------+----------+-------+ PTV      Full                                                 +---------+---------------+---------+-----------+----------+-------+ PERO     Full                                                 +---------+---------------+---------+-----------+----------+-------+  Left Venous Findings: +---------+---------------+---------+-----------+----------+-------+          CompressibilityPhasicitySpontaneityPropertiesSummary +---------+---------------+---------+-----------+----------+-------+ CFV      Full           Yes      Yes                          +---------+---------------+---------+-----------+----------+-------+ SFJ       Full                                                 +---------+---------------+---------+-----------+----------+-------+ FV Prox  Full                                                 +---------+---------------+---------+-----------+----------+-------+ FV Mid   Full                                                 +---------+---------------+---------+-----------+----------+-------+ FV DistalFull                                                 +---------+---------------+---------+-----------+----------+-------+  PFV      Full                                                 +---------+---------------+---------+-----------+----------+-------+ POP      Full           Yes      Yes                          +---------+---------------+---------+-----------+----------+-------+ PTV      Full                                                 +---------+---------------+---------+-----------+----------+-------+ PERO     Full                                                 +---------+---------------+---------+-----------+----------+-------+    Summary: Right: There is no evidence of deep vein thrombosis in the lower extremity. No cystic structure found in the popliteal fossa. Left: There is no evidence of deep vein thrombosis in the lower extremity. No cystic structure found in the popliteal fossa.  *See table(s) above for measurements and observations. Electronically signed by Monica Martinez MD on 03/25/2018 at 5:01:00 PM.    Final    Vas [place] Upper Extremity Venous Duplex  Result Date: 03/31/2018 UPPER VENOUS STUDY  Indications: Swelling Performing Technologist: Oliver Hum RVT  Examination Guidelines: A complete evaluation includes B-mode imaging, spectral Doppler, color Doppler, and power Doppler as needed of all accessible portions of each vessel. Bilateral testing is considered an integral part of a complete examination. Limited examinations for reoccurring  indications may be performed as noted.  Right Findings: +----------+------------+----------+---------+-----------+-------+ RIGHT     CompressiblePropertiesPhasicitySpontaneousSummary +----------+------------+----------+---------+-----------+-------+ IJV           Full                 Yes       Yes            +----------+------------+----------+---------+-----------+-------+ Subclavian    Full                 Yes       Yes            +----------+------------+----------+---------+-----------+-------+ Axillary      Full                 Yes       Yes            +----------+------------+----------+---------+-----------+-------+ Brachial      Full                 Yes       Yes            +----------+------------+----------+---------+-----------+-------+ Radial        Full                                          +----------+------------+----------+---------+-----------+-------+ Ulnar  Full                                          +----------+------------+----------+---------+-----------+-------+ Cephalic      Full                                          +----------+------------+----------+---------+-----------+-------+ Basilic       Full                                          +----------+------------+----------+---------+-----------+-------+  Left Findings: +----------+------------+----------+---------+-----------+-------+ LEFT      CompressiblePropertiesPhasicitySpontaneousSummary +----------+------------+----------+---------+-----------+-------+ Subclavian    Full                 Yes       Yes            +----------+------------+----------+---------+-----------+-------+  Summary:  Right: No evidence of deep vein thrombosis in the upper extremity. No evidence of superficial vein thrombosis in the upper extremity.  Left: No evidence of thrombosis in the subclavian.  *See table(s) above for measurements and observations.  Diagnosing  physician: Monica Martinez MD Electronically signed by Monica Martinez MD on 03/31/2018 at 4:34:35 PM.    Final     Microbiology: Recent Results (from the past 240 hour(s))  Urine culture     Status: Abnormal   Collection Time: 03/25/18 12:25 AM  Result Value Ref Range Status   Specimen Description URINE, CLEAN CATCH  Final   Special Requests   Final    NONE Performed at [facility], [place] [address], [place], [place] [address]    Culture >=100,000 COLONIES/mL PSEUDOMONAS AERUGINOSA (A)  Final   Report Status 03/27/2018 FINAL  Final   Organism ID, Bacteria PSEUDOMONAS AERUGINOSA (A)  Final      Susceptibility   Pseudomonas aeruginosa - MIC*    CEFTAZIDIME 4 SENSITIVE Sensitive     CIPROFLOXACIN >=4 RESISTANT Resistant     GENTAMICIN 4 SENSITIVE Sensitive     IMIPENEM 2 SENSITIVE Sensitive     PIP/TAZO 8 SENSITIVE Sensitive     CEFEPIME 8 SENSITIVE Sensitive     * >=100,000 COLONIES/mL PSEUDOMONAS AERUGINOSA  Culture, blood (Routine X 2) w Reflex to ID Panel     Status: None   Collection Time: 03/25/18  5:53 AM  Result Value Ref Range Status   Specimen Description BLOOD RIGHT ARM  Final   Special Requests   Final    BOTTLES DRAWN AEROBIC ONLY Blood Culture results may not be optimal due to an inadequate volume of blood received in culture bottles   Culture   Final    NO GROWTH 5 DAYS Performed at [facility], [place] [address] [place], [place] [address]    Report Status 03/30/2018 FINAL  Final  Culture, blood (Routine X 2) w Reflex to ID Panel     Status: None   Collection Time: 03/28/18  8:25 AM  Result Value Ref Range Status   Specimen Description BLOOD LEFT HAND  Final   Special Requests   Final    BOTTLES DRAWN AEROBIC ONLY Blood Culture adequate volume   Culture   Final    NO GROWTH 5 DAYS  Performed at [facility], [place] [address] [place], [place] [address]    Report Status 04/02/2018 FINAL  Final  Culture, blood (Routine X 2) w Reflex to ID  Panel     Status: None   Collection Time: 03/28/18  8:25 AM  Result Value Ref Range Status   Specimen Description BLOOD LEFT HAND  Final   Special Requests   Final    BOTTLES DRAWN AEROBIC ONLY Blood Culture adequate volume   Culture   Final    NO GROWTH 5 DAYS Performed at [facility], [place] [address], [place], [place] [address]    Report Status 04/02/2018 FINAL  Final     Labs: Basic Metabolic Panel: Recent Labs  Lab 03/27/18 0223 03/30/18 0205 03/31/18 0211 04/01/18 0158  NA 138 134* 136 136  K 4.2 5.0 5.2* 5.0  CL 108 106 108 108  CO2 23 21* 21* 22  GLUCOSE 133* 130* 117* 91  BUN 36* 51* 58* 57*  CREATININE 1.21 1.72* 1.51* 1.64*  CALCIUM 9.3 9.1 9.1 9.0    Recent Labs  Lab 03/28/18 0807 03/30/18 0205 03/31/18 0211 04/01/18 0158 04/02/18 0603  WBC 18.5* 19.8* 24.5* 22.6* 28.9*  NEUTROABS  --   --  17.2*  --   --   HGB 8.2* 7.3* 7.5* 7.4* 8.1*  HCT 28.9* 25.9* 25.2* 25.3* 29.1*  MCV 85.3 84.9 84.6 85.5 86.6  PLT 125* 118* 127* 139* 209    Recent Labs    07/16/17 0558 03/09/18 1450 04/01/18 0158  BNP 499.9* 193.7* 92.4    Principal Problem:   Acute lower UTI/H/o Pseudomonas UTI Active Problems:   Myelofibrosis (HCC)   BPH (benign prostatic hyperplasia)   CKD (chronic kidney disease) stage 3, GFR 30-59 ml/min (HCC)   Type II diabetes mellitus (HCC)   Pulmonary hypertension severe   Myelodysplasia (myelodysplastic syndrome) (HCC)   HTN (hypertension)   Complicated UTI (urinary tract infection)   Time coordinating discharge: 40 minutes  Signed:  Murray Hodgkins, MD [facility] 04/02/2018, 12:11 PM

## 2018-04-02 NOTE — Progress Notes (Signed)
Pt insistent on wearing O2 by [place]. Pt educated he is 94% on RA. Pt states he wears it at home. Pt provided O2. Pt placed [place] on with no difficulties and no assistance from RN.

## 2018-04-02 NOTE — Progress Notes (Signed)
Nsg Discharge Note  Admit Date:  03/24/2018 Discharge date: 04/02/2018   [name] to be D/C'd Home per MD order.  AVS completed.  Copy for chart, and copy for patient signed, and dated. Patient/caregiver able to verbalize understanding.  Discharge Medication: Allergies as of 04/02/2018   No Known Allergies     Medication List    STOP taking these medications   amLODipine 5 MG tablet Commonly known as:  NORVASC     TAKE these medications   acetaminophen 325 MG tablet Commonly known as:  TYLENOL Take 2 tablets (650 mg total) by mouth every 6 (six) hours as needed for mild pain (or Fever >/= 101).   aspirin EC 325 MG tablet Take 325 mg by mouth daily.   b complex vitamins tablet Take 1 tablet by mouth daily.   benzonatate 100 MG capsule Commonly known as:  TESSALON Take 1 capsule (100 mg total) by mouth 3 (three) times daily as needed for cough.   clotrimazole 1 % cream Commonly known as:  LOTRIMIN Apply to Lt foot (between) Toes bid for athletes foot What changed:    how much to take  how to take this  when to take this  additional instructions   ferrous gluconate 240 (27 FE) MG tablet Commonly known as:  FERGON Take 240 mg by mouth daily.   furosemide 40 MG tablet Commonly known as:  LASIX Take 80 mg (2 tab) in morning and 40mg  (1tab) in evening What changed:    how much to take  how to take this  when to take this  additional instructions   hydrALAZINE 50 MG tablet Commonly known as:  APRESOLINE Take 1.5 tablets (75 mg total) by mouth 3 (three) times daily.   miconazole 2 % powder Commonly known as:  MICOTIN Apply 1 application topically as needed for itching.   mouth rinse Liqd solution 15 mLs by Mouth Rinse route 2 (two) times daily.   ondansetron 4 MG tablet Commonly known as:  ZOFRAN Take 1 tablet (4 mg total) by mouth every 6 (six) hours as needed for nausea or vomiting.   OXYGEN Inhale 2 L into the lungs continuous.    predniSONE 20 MG tablet Commonly known as:  DELTASONE Take 1 tablet (20 mg total) by mouth daily.   tamsulosin 0.4 MG Caps capsule Commonly known as:  FLOMAX Take 1 capsule (0.4 mg total) by mouth daily after supper.       Discharge Assessment: Vitals:   04/01/18 2200 04/02/18 0718  BP: 140/63 (!) 147/70  Pulse: 86 83  Resp: 19 18  Temp: 98.3 F (36.8 C)   SpO2: 93% 99%   Skin clean, dry and intact without evidence of skin break down, no evidence of skin tears noted. IV catheter discontinued intact. Site without signs and symptoms of complications - no redness or edema noted at insertion site, patient denies c/o pain - only slight tenderness at site.  Dressing with slight pressure applied.  D/c Instructions-Education: Discharge instructions given to patient/family with verbalized understanding. D/c education completed with patient/family including follow up instructions, medication list, d/c activities limitations if indicated, with other d/c instructions as indicated by MD - patient able to verbalize understanding, all questions fully answered. Patient instructed to return to ED, call 911, or call MD for any changes in condition.  Patient escorted via [place], and D/C home via private auto.  Eda Keys, RN 04/02/2018 12:51 PM

## 2018-04-04 ENCOUNTER — Telehealth: Payer: Self-pay

## 2018-04-04 NOTE — Telephone Encounter (Signed)
04/04/18   Transition Care Management Follow-up Telephone Call  ADMISSION DATE: 03/24/18  DISCHARGE DATE: 04/02/18   How have you been since you were released from the hospital? Patient states he is feeling fair    Do you understand why you were in the hospital? Yes    Do you understand the discharge instrcutions? Yes    Items Reviewed:  Medications reviewed: Patient states he would rather wait until he comes into office to review meds. Not up to it at this time. Explained importance of him having all medications and taking them correctly.    Allergies reviewed: NKDA   Dietary changes reviewed: Heart healthy,Diabetic,Low Sodium   Referrals reviewed: Appointment scheduled for Hospital follow up   Functional Questionnaire:  Activities of Daily Living (ADLs): Walks with a walker but able to perform all other ADL's independently per patient.  Any patient concerns? Patient would like Rx for larger hospital bed. States he will give RX to Social Service to make arrangements.  Confirmed importance and date/time of follow-up visits scheduled: Yes  Confirmed with patient if condition begins to worsen call PCP or go to the ER. Yes    Patient was given the office number and encouragred to call back with questions or concerns. Yes.

## 2018-04-07 ENCOUNTER — Telehealth (HOSPITAL_COMMUNITY): Payer: Self-pay

## 2018-04-07 NOTE — Telephone Encounter (Signed)
Juan [name] was just discharged from the hospital recently and received many medications while inpatient. Therefore, he does not need more refills for at least another week or so.  He reports compliance with limiting fluids/salt. He reported some BLE edema and SOB but not consistently.   Ricardo Jericho  PharmD Candidate  [employer] [facility]  Class of [phone]

## 2018-04-08 ENCOUNTER — Ambulatory Visit (INDEPENDENT_AMBULATORY_CARE_PROVIDER_SITE_OTHER): Payer: Medicare Other | Admitting: Internal Medicine

## 2018-04-08 ENCOUNTER — Encounter: Payer: Self-pay | Admitting: Hematology

## 2018-04-08 ENCOUNTER — Inpatient Hospital Stay: Payer: Medicare Other | Attending: Hematology & Oncology

## 2018-04-08 ENCOUNTER — Encounter: Payer: Self-pay | Admitting: Internal Medicine

## 2018-04-08 ENCOUNTER — Inpatient Hospital Stay (HOSPITAL_BASED_OUTPATIENT_CLINIC_OR_DEPARTMENT_OTHER): Payer: Medicare Other | Admitting: Hematology

## 2018-04-08 VITALS — BP 126/62 | HR 80 | Temp 98.0°F | Resp 16 | Ht 75.0 in | Wt 205.4 lb

## 2018-04-08 VITALS — BP 154/79 | HR 79 | Temp 97.6°F | Resp 17 | Ht 75.0 in | Wt 191.0 lb

## 2018-04-08 DIAGNOSIS — D72829 Elevated white blood cell count, unspecified: Secondary | ICD-10-CM | POA: Diagnosis not present

## 2018-04-08 DIAGNOSIS — D7581 Myelofibrosis: Secondary | ICD-10-CM | POA: Diagnosis not present

## 2018-04-08 DIAGNOSIS — A498 Other bacterial infections of unspecified site: Secondary | ICD-10-CM

## 2018-04-08 DIAGNOSIS — Z87440 Personal history of urinary (tract) infections: Secondary | ICD-10-CM

## 2018-04-08 DIAGNOSIS — D469 Myelodysplastic syndrome, unspecified: Secondary | ICD-10-CM | POA: Diagnosis not present

## 2018-04-08 DIAGNOSIS — Z9119 Patient's noncompliance with other medical treatment and regimen: Secondary | ICD-10-CM

## 2018-04-08 DIAGNOSIS — I509 Heart failure, unspecified: Secondary | ICD-10-CM

## 2018-04-08 DIAGNOSIS — Z7189 Other specified counseling: Secondary | ICD-10-CM

## 2018-04-08 DIAGNOSIS — D473 Essential (hemorrhagic) thrombocythemia: Secondary | ICD-10-CM | POA: Insufficient documentation

## 2018-04-08 DIAGNOSIS — D500 Iron deficiency anemia secondary to blood loss (chronic): Secondary | ICD-10-CM | POA: Diagnosis not present

## 2018-04-08 DIAGNOSIS — N390 Urinary tract infection, site not specified: Secondary | ICD-10-CM

## 2018-04-08 DIAGNOSIS — E538 Deficiency of other specified B group vitamins: Secondary | ICD-10-CM | POA: Diagnosis not present

## 2018-04-08 DIAGNOSIS — Z91199 Patient's noncompliance with other medical treatment and regimen due to unspecified reason: Secondary | ICD-10-CM

## 2018-04-08 LAB — CBC WITH DIFFERENTIAL (CANCER CENTER ONLY)
Abs Immature Granulocytes: 4.1 10*3/uL — ABNORMAL HIGH (ref 0.00–0.07)
Band Neutrophils: 10 %
Basophils Absolute: 0.3 10*3/uL — ABNORMAL HIGH (ref 0.0–0.1)
Basophils Relative: 1 %
EOS ABS: 0.3 10*3/uL (ref 0.0–0.5)
EOS PCT: 1 %
HCT: 32.2 % — ABNORMAL LOW (ref 39.0–52.0)
Hemoglobin: 9.3 g/dL — ABNORMAL LOW (ref 13.0–17.0)
LYMPHS ABS: 4.4 10*3/uL — AB (ref 0.7–4.0)
Lymphocytes Relative: 14 %
MCH: 25 pg — ABNORMAL LOW (ref 26.0–34.0)
MCHC: 28.9 g/dL — AB (ref 30.0–36.0)
MCV: 86.6 fL (ref 80.0–100.0)
MYELOCYTES: 6 %
Metamyelocytes Relative: 6 %
Monocytes Absolute: 5.7 10*3/uL — ABNORMAL HIGH (ref 0.1–1.0)
Monocytes Relative: 18 %
NEUTROS PCT: 43 %
Neutro Abs: 16.6 10*3/uL — ABNORMAL HIGH (ref 1.7–7.7)
PROMYELOCYTES RELATIVE: 1 %
Platelet Count: 249 10*3/uL (ref 150–400)
RBC: 3.72 MIL/uL — ABNORMAL LOW (ref 4.22–5.81)
RDW: 18.9 % — AB (ref 11.5–15.5)
WBC Count: 31.4 10*3/uL — ABNORMAL HIGH (ref 4.0–10.5)
nRBC: 6.4 % — ABNORMAL HIGH (ref 0.0–0.2)

## 2018-04-08 LAB — CMP (CANCER CENTER ONLY)
ALT: 24 U/L (ref 0–44)
ANION GAP: 11 (ref 5–15)
AST: 21 U/L (ref 15–41)
Albumin: 3.3 g/dL — ABNORMAL LOW (ref 3.5–5.0)
Alkaline Phosphatase: 103 U/L (ref 38–126)
BUN: 58 mg/dL — ABNORMAL HIGH (ref 8–23)
CO2: 21 mmol/L — AB (ref 22–32)
CREATININE: 1.48 mg/dL — AB (ref 0.61–1.24)
Calcium: 9.5 mg/dL (ref 8.9–10.3)
Chloride: 107 mmol/L (ref 98–111)
GFR, Est AFR Am: 51 mL/min — ABNORMAL LOW (ref >60)
GFR, Estimated: 44 mL/min — ABNORMAL LOW (ref >60)
Glucose, Bld: 122 mg/dL — ABNORMAL HIGH (ref 70–99)
POTASSIUM: 4.1 mmol/L (ref 3.5–5.1)
SODIUM: 139 mmol/L (ref 135–145)
Total Bilirubin: 0.3 mg/dL (ref 0.3–1.2)
Total Protein: 6.7 g/dL (ref 6.5–8.1)

## 2018-04-08 LAB — SAVE SMEAR (SSMR)

## 2018-04-08 LAB — LACTATE DEHYDROGENASE: LDH: 848 U/L — AB (ref 98–192)

## 2018-04-08 NOTE — Patient Instructions (Addendum)
Please schedule Medicare Wellness with Glenard Haring.    GO TO THE FRONT DESK Schedule your next appointment for a  Check up in 3 months

## 2018-04-08 NOTE — Progress Notes (Addendum)
[place] OFFICE PROGRESS NOTE  Patient Care Team: Colon Branch, MD as PCP - General [name], MD as PCP - Cardiology (Cardiology) Roxana Hires, MD as Consulting Physician (Hematology and Oncology) [name], MD as Consulting Physician (Hematology and Oncology) Ardis Hughs, MD as Attending Physician (Urology)  HEME/ONC OVERVIEW: 1. 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 -Per clinic notes from [place] in 2015, the patient was thought to have high-risk myelofibrosis secondary to CAL-R+ ET; bone marrow biopsy and ruxolitinib were discussed, but the patient declined further work-up and treatment at that time -In reviewing the patient's labs, he has had persistent but stable leukocytosis with low percentages of circulating blast and normocytic anemia for a few years -[facility] NGS in 03/2018 confirmed CAL-R mutation, consistent with diagnosis of ET  -Ideally, bone marrow biopsy is required to confirm MF, and if confirmed, ruxolitinib may be a treatment option -However, given the patient's overall frailty and multiple medical comorbidities, it would be unlikely that he could tolerate any aggressive treatment -Therefore, I have deferred bone marrow bx   -I also discussed with the patient regarding the increased risk of leukemia transformation and the poor prognosis associated with acute leukemia -Hgb 9.3 today, no indication for [name]  -At this time, we will continue supportive therapy, including [name] as needed for symptomatic anemia  Recent Pseudomonas UTI -Patient has had at least 2 admissions for Pseudomonas UTIs  requiring IV antibiotics over the past month --I encouraged patient to follow-up with Juan PCP for any recurrent symptoms concerning for UTI  Goals of care discussion -I discussed with the patient and Juan [name] regarding the incurable nature of myelofibrosis; furthermore, I informed them the increased risk of leukemic transformation and the poor prognosis associated with it -Given the patient's frailty and multiple comorbidity, he is not a candidate for any aggressive treatment at this time -I encouraged the patient and Juan [name] to discuss with her family regarding goals of care, including Juan wishes regarding CPR and intubation -The patient and Juan [name] expressed understanding and will talk with Juan family; however, despite multiple discussions, the patient and Juan [name] appear to have limited understanding of Juan diseases   Orders Placed This Encounter  Procedures  . CBC with Differential (Cancer Center Only)    Standing Status:   Future    Standing Expiration Date:   05/13/2019  . CMP ([place] only)    Standing Status:   Future    Standing Expiration Date:   05/13/2019  . Lactate dehydrogenase    Standing Status:   Future    Standing Expiration Date:   05/13/2019   All questions were answered. The patient knows to call the clinic with any problems, questions or concerns. No barriers to learning was detected.  A total of more than 25 minutes were spent face-to-face with the patient during this encounter and over half of that time was spent on counseling and coordination of care as outlined above.   Return in 2 weeks for labs and clinic follow-up.  Tish Men, MD 04/08/2018 1:25 PM  CHIEF COMPLAINT: "I am here for blood check"  INTERVAL HISTORY: [name] returns to clinic for follow-up of suspected secondary myelofibrosis.  He was accompanied by Juan [name].  He was recently hospitalized for recurrent Pseudomonas UTI, for which he was treated with IV cefepime.  This was a second episode  of UTI over the past month requiring hospitalization. He reports that since discharge, the dysuria has improved significantly, and he has minimal dysuria at this time.  He denies any fever, chill, hematuria, or back pain.  He endorses chronic stable exertional dyspnea, but denies any weight change, night sweats, chest pain, or palpitations.  REVIEW OF SYSTEMS:   Constitutional: ( - ) fevers, ( - )  chills , ( - ) night sweats Eyes: ( - ) blurriness of vision, ( - ) double vision, ( - ) watery eyes Ears, nose, mouth, throat, and face: ( - ) mucositis, ( - ) sore throat Respiratory: ( - ) cough, ( - ) dyspnea, ( - ) wheezes Cardiovascular: ( - ) palpitation, ( - ) chest discomfort, ( - ) lower extremity swelling Gastrointestinal:  ( - ) nausea, ( - ) heartburn, ( - ) change in bowel habits Skin: ( - ) abnormal skin rashes Lymphatics: ( - ) new lymphadenopathy, ( - ) easy bruising Neurological: ( - ) numbness, ( - ) tingling, ( - ) new weaknesses Behavioral/Psych: ( - ) mood change, ( - ) new changes  All other systems were reviewed with the patient and are negative.  I have reviewed the past medical history, past surgical history, social history and family history with the patient and they are unchanged from previous note.  ALLERGIES:  has No Known Allergies.  MEDICATIONS:  Current Outpatient Medications  Medication Sig Dispense Refill  . acetaminophen (TYLENOL) 325 MG tablet Take 2 tablets (650 mg total) by mouth every 6 (six) hours as needed for mild pain (or Fever >/= 101).    Marland Kitchen aspirin EC 325 MG tablet Take 325 mg by mouth daily.     Marland Kitchen b complex vitamins tablet Take 1 tablet by mouth daily.    . benzonatate (TESSALON) 100 MG capsule Take 1 capsule (100 mg total) by mouth 3 (three) times daily as needed for cough. 30 capsule 0  . clotrimazole (LOTRIMIN AF) 1 % cream Apply to Lt foot (between) Toes bid for athletes foot (Patient taking differently: Apply 1 application topically 2 (two)  times daily. Apply to left foot (between) toes twice daily for athletes foot.) 28 g 0  . ferrous gluconate (FERGON) 240 (27 FE) MG tablet Take 240 mg by mouth daily.    . furosemide (LASIX) 40 MG tablet Take 80 mg (2 tab) in morning and 77m (1tab) in evening (Patient taking differently: Take 40-80 mg by mouth See admin instructions. Take 80 mg (2 tablets) by mouth in morning and 435m(1 tablet) in evening.) 90 tablet 1  . hydrALAZINE (APRESOLINE) 50 MG tablet Take 1.5 tablets (75 mg total) by mouth 3 (three) times daily. 135 tablet 2  . hydrocortisone cream 1 % Apply 1 application topically 2 (two) times daily.    . miconazole (MICOTIN) 2 % powder Apply 1 application topically as needed for itching.     . mouth rinse LIQD solution 15 mLs by Mouth Rinse route 2 (two) times daily. 118 mL 0  . ondansetron (ZOFRAN) 4 MG tablet Take 1 tablet (4 mg total) by mouth every 6 (six) hours as needed for nausea or vomiting. 10 tablet 0  . OXYGEN Inhale 2 L into  the lungs continuous.    . tamsulosin (FLOMAX) 0.4 MG CAPS capsule Take 1 capsule (0.4 mg total) by mouth daily after supper. 30 capsule 0   No current facility-administered medications for this visit.     PHYSICAL EXAMINATION: ECOG PERFORMANCE STATUS: 3 - Symptomatic, >50% confined to bed  Today's Vitals   04/08/18 1303  BP: (!) 154/79  Pulse: 79  Resp: 17  Temp: 97.6 F (36.4 C)  TempSrc: Oral  SpO2: 100%  Weight: 191 lb (86.6 kg)  Height: 6' 3" (1.905 m)  PainSc: 0-No pain   Body mass index is 23.87 kg/m.  Filed Weights   04/08/18 1303  Weight: 191 lb (86.6 kg)    GENERAL: alert, no distress and comfortable sitting in a wheelchair, older than stated age SKIN: skin color, texture, turgor are normal, no rashes or significant lesions EYES: conjunctiva are pink and non-injected, sclera clear OROPHARYNX: no exudate, no erythema; lips, buccal mucosa, and tongue normal  NECK: supple, non-tender LYMPH:  no palpable lymphadenopathy in  the cervical or axillary  LUNGS: clear to auscultation and percussion with normal breathing effort HEART: regular rate & rhythm and no murmurs and 1+ bilateral lower extremity edema ABDOMEN: soft, non-tender, non-distended, normal bowel sounds Musculoskeletal: no cyanosis of digits and no clubbing  PSYCH: alert & oriented x 3, fluent speech  LABORATORY DATA:  I have reviewed the data as listed    Component Value Date/Time   NA 139 04/08/2018 0949   NA 142 02/28/2018 1202   NA 143 03/30/2017 1420   NA 138 11/16/2013 0847   K 4.1 04/08/2018 0949   K 4.3 03/30/2017 1420   K 4.5 11/16/2013 0847   CL 107 04/08/2018 0949   CL 110 (H) 03/30/2017 1420   CO2 21 (L) 04/08/2018 0949   CO2 27 03/30/2017 1420   CO2 22 11/16/2013 0847   GLUCOSE 122 (H) 04/08/2018 0949   GLUCOSE 106 03/30/2017 1420   BUN 58 (H) 04/08/2018 0949   BUN 68 (H) 02/28/2018 1202   BUN 38 (H) 03/30/2017 1420   BUN 20.2 11/16/2013 0847   CREATININE 1.48 (H) 04/08/2018 0949   CREATININE 1.3 (H) 03/30/2017 1420   CREATININE 1.2 11/16/2013 0847   CALCIUM 9.5 04/08/2018 0949   CALCIUM 9.8 03/30/2017 1420   CALCIUM 9.8 11/16/2013 0847   PROT 6.7 04/08/2018 0949   PROT 6.0 (L) 03/30/2017 1420   PROT 6.7 11/16/2013 0847   ALBUMIN 3.3 (L) 04/08/2018 0949   ALBUMIN 2.8 (L) 03/30/2017 1420   ALBUMIN 3.4 (L) 11/16/2013 0847   AST 21 04/08/2018 0949   AST 25 11/16/2013 0847   ALT 24 04/08/2018 0949   ALT 22 03/30/2017 1420   ALT 11 11/16/2013 0847   ALKPHOS 103 04/08/2018 0949   ALKPHOS 116 (H) 03/30/2017 1420   ALKPHOS 125 11/16/2013 0847   BILITOT 0.3 04/08/2018 0949   BILITOT 0.35 11/16/2013 0847   GFRNONAA 44 (L) 04/08/2018 0949   GFRAA 51 (L) 04/08/2018 0949    No results found for: SPEP, UPEP  Lab Results  Component Value Date   WBC 31.4 (H) 04/08/2018   NEUTROABS 16.6 (H) 04/08/2018   HGB 9.3 (L) 04/08/2018   HCT 32.2 (L) 04/08/2018   MCV 86.6 04/08/2018   PLT 249 04/08/2018      Chemistry       Component Value Date/Time   NA 139 04/08/2018 0949   NA 142 02/28/2018 1202   NA 143 03/30/2017 1420   NA  138 11/16/2013 0847   K 4.1 04/08/2018 0949   K 4.3 03/30/2017 1420   K 4.5 11/16/2013 0847   CL 107 04/08/2018 0949   CL 110 (H) 03/30/2017 1420   CO2 21 (L) 04/08/2018 0949   CO2 27 03/30/2017 1420   CO2 22 11/16/2013 0847   BUN 58 (H) 04/08/2018 0949   BUN 68 (H) 02/28/2018 1202   BUN 38 (H) 03/30/2017 1420   BUN 20.2 11/16/2013 0847   CREATININE 1.48 (H) 04/08/2018 0949   CREATININE 1.3 (H) 03/30/2017 1420   CREATININE 1.2 11/16/2013 0847   GLU 93 05/24/2017      Component Value Date/Time   CALCIUM 9.5 04/08/2018 0949   CALCIUM 9.8 03/30/2017 1420   CALCIUM 9.8 11/16/2013 0847   ALKPHOS 103 04/08/2018 0949   ALKPHOS 116 (H) 03/30/2017 1420   ALKPHOS 125 11/16/2013 0847   AST 21 04/08/2018 0949   AST 25 11/16/2013 0847   ALT 24 04/08/2018 0949   ALT 22 03/30/2017 1420   ALT 11 11/16/2013 0847   BILITOT 0.3 04/08/2018 0949   BILITOT 0.35 11/16/2013 0847       RADIOGRAPHIC STUDIES: I have personally reviewed the radiological images as listed below and agreed with the findings in the report. Dg Chest 2 View  Result Date: 03/24/2018 CLINICAL DATA:  Shortness of breath EXAM: CHEST - 2 VIEW COMPARISON:  03/09/2018, 09/05/2017, 01/23/2017 FINDINGS: Cardiomegaly with vascular congestion and mild diffuse interstitial and ground-glass opacity. No pleural effusion. No pneumothorax. Slight increased atelectasis at the left base. Aortic atherosclerosis. Heterogenous sclerosis within the osseous structures as before. IMPRESSION: 1. Slight increased atelectasis at the left base. 2. Otherwise similar appearance of diffuse bilateral interstitial and ground-glass opacity as compared with multiple prior exams and presumably chronic. 3. Cardiomegaly Electronically Signed   By: Donavan Foil M.D.   On: 03/24/2018 23:59   Dg Chest 2 View  Result Date: 03/09/2018 CLINICAL DATA:   Shortness of breath, chest pain, hypertension, diabetes EXAM: CHEST - 2 VIEW COMPARISON:  09/05/2017 FINDINGS: Stable cardiomegaly with mild perihilar and basilar interstitial prominence suspicious for chronic edema versus interstitial lung disease. Chronic elevation of the right hemidiaphragm. Improvement in the left basilar atelectasis. No large effusion or pneumothorax. Trachea is midline. Aorta atherosclerotic. Diffuse sclerosis of the visualized chest and upper extremities compatible with history of myelofibrosis. IMPRESSION: Stable cardiomegaly with mild chronic interstitial edema versus interstitial lung disease. Chronic right hemidiaphragm elevation Improving left basilar atelectasis Chronic osseous sclerosis compatible with myelofibrosis Atherosclerosis Electronically Signed   By: Jerilynn Mages.  Shick M.D.   On: 03/09/2018 15:29   Dg Hand Complete Right  Result Date: 03/26/2018 CLINICAL DATA:  Right hand pain. EXAM: RIGHT HAND - COMPLETE 3+ VIEW COMPARISON:  None. FINDINGS: No fracture. No bone lesion. Skeletal structures are demineralized. Second metacarpal phalangeal joint space narrowing with mild spurring from the base of the second metacarpal head. Remaining joints normally spaced and aligned. There is soft tissue swelling noted at the base of the index finger. IMPRESSION: 1. No fracture or dislocation. 2. Arthropathic changes at the second metacarpophalangeal joint associated with soft tissue swelling that extends to the proximal index finger. Electronically Signed   By: Lajean Manes M.D.   On: 03/26/2018 16:10   Vas [place] Lower Extremity Venous (dvt)  Result Date: 03/25/2018  Lower Venous Study Indications: Swelling, and Edema.  Performing Technologist: Abram Sander  Examination Guidelines: A complete evaluation includes B-mode imaging, spectral Doppler, color Doppler, and power Doppler as needed  of all accessible portions of each vessel. Bilateral testing is considered an integral part of a complete  examination. Limited examinations for reoccurring indications may be performed as noted.  Right Venous Findings: +---------+---------------+---------+-----------+----------+-------+          CompressibilityPhasicitySpontaneityPropertiesSummary +---------+---------------+---------+-----------+----------+-------+ CFV      Full           Yes      Yes                          +---------+---------------+---------+-----------+----------+-------+ SFJ      Full                                                 +---------+---------------+---------+-----------+----------+-------+ FV Prox  Full                                                 +---------+---------------+---------+-----------+----------+-------+ FV Mid   Full                                                 +---------+---------------+---------+-----------+----------+-------+ FV DistalFull                                                 +---------+---------------+---------+-----------+----------+-------+ PFV      Full                                                 +---------+---------------+---------+-----------+----------+-------+ POP      Full           Yes      Yes                          +---------+---------------+---------+-----------+----------+-------+ PTV      Full                                                 +---------+---------------+---------+-----------+----------+-------+ PERO     Full                                                 +---------+---------------+---------+-----------+----------+-------+  Left Venous Findings: +---------+---------------+---------+-----------+----------+-------+          CompressibilityPhasicitySpontaneityPropertiesSummary +---------+---------------+---------+-----------+----------+-------+ CFV      Full           Yes      Yes                          +---------+---------------+---------+-----------+----------+-------+ SFJ      Full                                                  +---------+---------------+---------+-----------+----------+-------+  FV Prox  Full                                                 +---------+---------------+---------+-----------+----------+-------+ FV Mid   Full                                                 +---------+---------------+---------+-----------+----------+-------+ FV DistalFull                                                 +---------+---------------+---------+-----------+----------+-------+ PFV      Full                                                 +---------+---------------+---------+-----------+----------+-------+ POP      Full           Yes      Yes                          +---------+---------------+---------+-----------+----------+-------+ PTV      Full                                                 +---------+---------------+---------+-----------+----------+-------+ PERO     Full                                                 +---------+---------------+---------+-----------+----------+-------+    Summary: Right: There is no evidence of deep vein thrombosis in the lower extremity. No cystic structure found in the popliteal fossa. Left: There is no evidence of deep vein thrombosis in the lower extremity. No cystic structure found in the popliteal fossa.  *See table(s) above for measurements and observations. Electronically signed by Monica Martinez MD on 03/25/2018 at 5:01:00 PM.    Final    Vas [place] Upper Extremity Venous Duplex  Result Date: 03/31/2018 UPPER VENOUS STUDY  Indications: Swelling Performing Technologist: Oliver Hum RVT  Examination Guidelines: A complete evaluation includes B-mode imaging, spectral Doppler, color Doppler, and power Doppler as needed of all accessible portions of each vessel. Bilateral testing is considered an integral part of a complete examination. Limited examinations for reoccurring indications may  be performed as noted.  Right Findings: +----------+------------+----------+---------+-----------+-------+ RIGHT     CompressiblePropertiesPhasicitySpontaneousSummary +----------+------------+----------+---------+-----------+-------+ IJV           Full                 Yes       Yes            +----------+------------+----------+---------+-----------+-------+ Subclavian    Full                 Yes  Yes            +----------+------------+----------+---------+-----------+-------+ Axillary      Full                 Yes       Yes            +----------+------------+----------+---------+-----------+-------+ Brachial      Full                 Yes       Yes            +----------+------------+----------+---------+-----------+-------+ Radial        Full                                          +----------+------------+----------+---------+-----------+-------+ Ulnar         Full                                          +----------+------------+----------+---------+-----------+-------+ Cephalic      Full                                          +----------+------------+----------+---------+-----------+-------+ Basilic       Full                                          +----------+------------+----------+---------+-----------+-------+  Left Findings: +----------+------------+----------+---------+-----------+-------+ LEFT      CompressiblePropertiesPhasicitySpontaneousSummary +----------+------------+----------+---------+-----------+-------+ Subclavian    Full                 Yes       Yes            +----------+------------+----------+---------+-----------+-------+  Summary:  Right: No evidence of deep vein thrombosis in the upper extremity. No evidence of superficial vein thrombosis in the upper extremity.  Left: No evidence of thrombosis in the subclavian.  *See table(s) above for measurements and observations.  Diagnosing physician:  Monica Martinez MD Electronically signed by Monica Martinez MD on 03/31/2018 at 4:34:35 PM.    Final

## 2018-04-08 NOTE — Progress Notes (Signed)
Subjective:    Patient ID: Juan [name], male    DOB: 1941/01/20, 77 y.o.   MRN: [id number]  DOS:  04/08/2018 Type of visit - description : [facility] admission follow-up Admitted 03/24/2018, discharged 04/02/2018. He was admitted due to Pseudomonas UTI. Was treated with cefepime. He was noted to have chronic leukocytosis. During the admission he had a second metacarpal phalangeal joint acute gout and was treated with colchicine and prednisone. Was recommended to finish prednisone as an outpatient. Kidney function was a stable. He was recommended SNF due to generalized weakness but patient refused. Had lower extremity edema, Dopplers negative for DVT, amlodipine was stopped and he was recommended to restart Lasix upon discharge.  Wt Readings from Last 3 Encounters:  03/02/18 191 lb (86.6 kg)  03/02/18 191 lb (86.6 kg)  02/28/18 191 lb 12.8 oz (87 kg)     Review of Systems Since he left the hospital he is back home, here with a family member He is doing okay. Denies fever, chills? Shortness of breath at baseline Edema about the same. No gross hematuria, difficulty urinating.  I asked him about dysuria and he said "a tiny bit". Had gout, resolved. No recent ambulatory BPs.  Past Medical History:  Diagnosis Date  . Allergic rhinitis   . Anemia   . Ascending aortic aneurysm ([place]) 03/2014   4.3cm on CT scan  . CAD (coronary artery disease)    dx elsewheer in past, no documentation. Non-ischemic myoview 2007  . Chronic diastolic CHF (congestive heart failure), NYHA class 2 (HCC)    Normal EF w/ grade 1 dd by echo 12/2015  . Chronic respiratory failure with hypoxia/2L at home    "2L; all the time" (05/03/2017)  . CKD (chronic kidney disease), stage III ([place])   . Edema    R>L leg, u/s 5-12 neg for DVT  . Hemorrhoid   . History of hydronephrosis   . History of Splenic infarct   . History of thrombocytosis   . Hypertension   . Iron deficiency anemia due to chronic blood  loss 03/31/2017  . 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 Migraine    "once/wk at least" (07/11/2013)  . Moderate to severe pulmonary hypertension ([place])   . Myeloproliferative disease ([place])   . Pyelonephritis 04/29/2017  . Recurrent Pseudomonas urinary tract infection   . Shortness of breath   . Sinus congestion   . Sleep apnea, obstructive    at some point used CPAP, was d/c  years ago  . Type II diabetes mellitus ([place])   . UTI (urinary tract infection) 06/2017  . UTI (urinary tract infection) 07/2017    Past Surgical History:  Procedure Laterality Date  . FLEXIBLE SIGMOIDOSCOPY N/A 06/08/2017   Procedure: FLEXIBLE SIGMOIDOSCOPY;  Surgeon: Carol Ada, MD;  Location: [facility];  Service: Endoscopy;  Laterality: N/A;  . IR FLUORO GUIDE CV LINE RIGHT  05/07/2017  . IR FLUORO RM 30-60 MIN  05/07/2017  . IR US GUIDE VASC ACCESS RIGHT  05/07/2017  . TOE SURGERY Right    "tried to straighten out big toe" (07/11/2013)    Social History   Socioeconomic History  . Marital status: Widowed    Spouse name: Not on file  . Number of children: 2  . Years of education: Not on file  . Highest education level: Not on file  Occupational History  . Occupation: retired     Fish farm manager: RETIRED  Social Needs  . Financial resource strain: Not on file  .  Food insecurity:    Worry: Not on file    Inability: Not on file  . Transportation needs:    Medical: Not on file    Non-medical: Not on file  Tobacco Use  . Smoking status: Former Smoker    Packs/day: 0.25    Years: 12.00    Pack years: 3.00    Types: Cigarettes    Last attempt to quit: 05/18/1966    Years since quitting: 51.9  . Smokeless tobacco: Never Used  . Tobacco comment: quit smoking 45 years ago  Substance and Sexual Activity  . Alcohol use: No    Alcohol/week: 0.0 standard drinks  . Drug use: No  . Sexual activity: Not Currently  Lifestyle  . Physical activity:    Days per week: Not on file    Minutes per session: Not on  file  . Stress: Not on file  Relationships  . Social connections:    Talks on phone: Not on file    Gets together: Not on file    Attends religious service: Not on file    Active member of club or organization: Not on file    Attends meetings of clubs or organizations: Not on file    Relationship status: Not on file  . Intimate partner violence:    Fear of current or ex partner: Not on file    Emotionally abused: Not on file    Physically abused: Not on file    Forced sexual activity: Not on file  Other Topics Concern  . Not on file  Social History Narrative   Moved from [place] 2006   Widow 2007   Son lives with him       Admitted to [facility] 05/08/17   Former smoker-stopped 1968   Alcohol none   Full Code      Allergies as of 04/08/2018   No Known Allergies     Medication List        Accurate as of 04/08/18 11:37 AM. Always use your most recent med list.          acetaminophen 325 MG tablet Commonly known as:  TYLENOL Take 2 tablets (650 mg total) by mouth every 6 (six) hours as needed for mild pain (or Fever >/= 101).   aspirin EC 325 MG tablet Take 325 mg by mouth daily.   b complex vitamins tablet Take 1 tablet by mouth daily.   benzonatate 100 MG capsule Commonly known as:  TESSALON Take 1 capsule (100 mg total) by mouth 3 (three) times daily as needed for cough.   clotrimazole 1 % cream Commonly known as:  LOTRIMIN Apply to Lt foot (between) Toes bid for athletes foot   ferrous gluconate 240 (27 FE) MG tablet Commonly known as:  FERGON Take 240 mg by mouth daily.   furosemide 40 MG tablet Commonly known as:  LASIX Take 80 mg (2 tab) in morning and 40mg  (1tab) in evening   hydrALAZINE 50 MG tablet Commonly known as:  APRESOLINE Take 1.5 tablets (75 mg total) by mouth 3 (three) times daily.   hydrocortisone cream 1 % Apply 1 application topically 2 (two) times daily.   miconazole 2 % powder Commonly known as:   MICOTIN Apply 1 application topically as needed for itching.   mouth rinse Liqd solution 15 mLs by Mouth Rinse route 2 (two) times daily.   ondansetron 4 MG tablet Commonly known as:  ZOFRAN Take 1 tablet (4 mg total)  by mouth every 6 (six) hours as needed for nausea or vomiting.   OXYGEN Inhale 2 L into the lungs continuous.   predniSONE 20 MG tablet Commonly known as:  DELTASONE Take 1 tablet (20 mg total) by mouth daily.   tamsulosin 0.4 MG Caps capsule Commonly known as:  FLOMAX Take 1 capsule (0.4 mg total) by mouth daily after supper.           Objective:   Physical Exam BP 126/62 (BP Location: Right Arm, Patient Position: Sitting, Cuff Size: Normal)   Pulse 80   Resp 16   Ht 6\' 3"  (1.905 m)   SpO2 91%   BMI 23.87 kg/m   General:   Well developed, NAD, BMI noted. HEENT:  Normocephalic . Face symmetric, atraumatic Lungs:  CTA B Normal respiratory effort, no intercostal retractions, no accessory muscle use. Heart: RRR,  no murmur.  +/+++ pretibial edema bilaterally  Skin: Not pale. Not jaundice Neurologic:  alert & oriented X3.  Speech normal, gait not tested, sitting in a wheelchair. Psych--  Behavior appropriate. No anxious or depressed appearing.      Assessment & Plan:    Assessment Prediabetes  Neuropathy, on Neurontin prn and hydrocodone prn HTN CKD, creatinine ~ 1.7-1.9 Chronic edema and stasis dermatitis Morbid obesity CV: --CHF, EF 30% 01-2015, normal Myoview 2007, refuse it cardiac catheterization --Ascending aortic aneurysm per CT angio 03-2014, 4.3 cm --CT chest without done 06-2014 --  Refused MRA of the Ao 2016  --pulmonary hypertension: 10-2016: V/Q can neg, ANA-RF-ANCa (-) Hypoxia: On home oxygen since admission 10/2016 Hem-onc: ---Myeloproliferative disease , refuses treatment --- anemia --- thrombocytosiscytosis  GU: BPH- bladder outlet obstruction-chronic Foley Per urology 2017: "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 They recommend continuing Foley catheter, change every 4 weeks " OSA, used a CPAP at some point Poor compliance with advice:  Saw psychiatry 04/07/2014 at the hospital and deemed to be competent to make his own decisions. Deemed completed again March 2019  PLAN: Pseudomonal UTI: Status post treatment with IV antibiotics.  Currently with no fever, he reports a "tiny bit" of dysuria.  I doubt that he has a clinical infection.  No further testing at this point. CHF: At the last admission, amlodipine was discontinued and he is back taking Lasix 40 mg 2 tablets in the morning and 1 in the afternoon.  He is also on Apresoline.  CMP and CBC done earlier today at hematology. will check results. Weight today 205, higher than before, lungs are clear, edema at baseline, recommend to continue with Lasix. Further advise w/ results RTC 3 months

## 2018-04-08 NOTE — Progress Notes (Signed)
Pre visit review using our clinic review tool, if applicable. No additional management support is needed unless otherwise documented below in the visit note. 

## 2018-04-09 ENCOUNTER — Telehealth: Payer: Self-pay | Admitting: Internal Medicine

## 2018-04-09 NOTE — Assessment & Plan Note (Signed)
Pseudomonal UTI: Status post treatment with IV antibiotics.  Currently with no fever, he reports a "tiny bit" of dysuria.  I doubt that he has a clinical infection.  No further testing at this point. CHF: At the last admission, amlodipine was discontinued and he is back taking Lasix 40 mg 2 tablets in the morning and 1 in the afternoon.  He is also on Apresoline.  CMP and CBC done earlier today at hematology. will check results. Weight today 205, higher than before, lungs are clear, edema at baseline, recommend to continue with Lasix. Further advise w/ results RTC 3 months

## 2018-04-09 NOTE — Telephone Encounter (Signed)
See last visit,  during last admission amlodipine d/c, restarted lasix 40 mg 2 tab in the morning 1 in the afternoon. His weight is up to 210, CMP from 04-08-18  came back ok thus will rec to increase lasix to 2 tabs BID . Please notify pt

## 2018-04-11 NOTE — Telephone Encounter (Signed)
Tried calling Pt- no answer, voicemail box has not been set up. Will try again later.

## 2018-04-13 LAB — FLOW CYTOMETRY

## 2018-04-13 NOTE — Telephone Encounter (Signed)
Tried calling Pt- again no answer- unable to leave message.

## 2018-04-18 MED FILL — AMLODIPINE BESYLATE 5 MG TA: 5 | 30 days supply | Qty: 30 | Fill #1

## 2018-04-18 MED FILL — hydrALAZINE HCL 50 MG TABS: 50 | 30 days supply | Qty: 135 | Fill #1

## 2018-04-18 MED FILL — FUROSEMIDE 40 MG TAB: 40 | 30 days supply | Qty: 90 | Fill #1

## 2018-04-21 LAB — JAK2 (INCLUDING V617F AND EXON 12), MPL,& CALR-NEXT GEN SEQ

## 2018-04-29 ENCOUNTER — Telehealth: Payer: Self-pay

## 2018-04-29 NOTE — Telephone Encounter (Signed)
Will await paperwork. Pt will probably have to come to office for documentation of need for hosp bed.

## 2018-04-29 NOTE — Telephone Encounter (Signed)
Copied from [place] [id number]. Topic: General - Other >> Apr 28, 2018 11:41 AM Valla Leaver wrote: Reason for CRM: Patient says he is supposed to get a hospital bed through medicare and Dr. Larose Kells needs to get in touch with Medicare to assist with that process. Patient says Dr. Larose Kells should be getting information via the mail regarding this request by next week Tuesday.

## 2018-05-04 NOTE — Telephone Encounter (Signed)
Ivin Booty- can you be on the look out for this please?

## 2018-05-05 ENCOUNTER — Telehealth (HOSPITAL_COMMUNITY): Payer: Self-pay

## 2018-05-05 NOTE — Telephone Encounter (Signed)
All that was received was a hand-written note from patient asking [place] to call [facility] operations. Per provider, pt must have appointment to discuss and document need and request for a hospital bed for home use; forwarded copy of note to scheduler to contact patient to schedule appt/SLS 12/19

## 2018-05-05 NOTE — Telephone Encounter (Signed)
Appt scheduled for 05/23/2018.

## 2018-05-05 NOTE — Telephone Encounter (Signed)
Mr. Rivkin admits weighing himself sometimes and denies sudden large change in wt. He reports some swelling in stomach/SOB, denies missing doses. States adherence to limiting salt/fluids.   Ricardo Jericho  PharmD Candidate  [employer] [facility]  Class of [id number]

## 2018-05-16 ENCOUNTER — Telehealth: Payer: Self-pay

## 2018-05-16 NOTE — Telephone Encounter (Signed)
Discussed w/ [name] at [facility]- forms received, signed by PCP and faxed to [phone]. Form sent for scanning.

## 2018-05-19 MED FILL — FUROSEMIDE 40 MG TAB: 40 | 30 days supply | Qty: 90 | Fill #2

## 2018-05-19 MED FILL — hydrALAZINE HCL 50 MG TABS: 50 | 30 days supply | Qty: 135 | Fill #2

## 2018-05-23 ENCOUNTER — Ambulatory Visit (INDEPENDENT_AMBULATORY_CARE_PROVIDER_SITE_OTHER): Payer: Medicare Other | Admitting: Internal Medicine

## 2018-05-23 ENCOUNTER — Encounter: Payer: Self-pay | Admitting: Internal Medicine

## 2018-05-23 VITALS — BP 138/84 | HR 73 | Temp 97.9°F | Resp 16 | Ht 75.0 in | Wt 192.0 lb

## 2018-05-23 DIAGNOSIS — I5032 Chronic diastolic (congestive) heart failure: Secondary | ICD-10-CM

## 2018-05-23 NOTE — Assessment & Plan Note (Signed)
CHF: Seems to be stable based on symptoms and his weight. Currently taking Lasix 40 mg: 2 tablets in the morning and 1 in the afternoon; was recommended to increase the dose but he did not, at this point he is doing well, weight is a stable consequently we continue with present care. Anemia: Last CBC stable Hospital bed: The patient has CHF a medical condition that requires to position his body in ways not feasible with an ordinary bed.  He needs to elevate the head of the bed more than 30 degrees due to CHF, also needs to elevate his legs. In addition, he has anemia and weakness.

## 2018-05-23 NOTE — Patient Instructions (Addendum)
Please schedule Medicare Wellness with Glenard Haring.    GO TO THE FRONT DESK Schedule your next appointment for a checkup in 3 to 4 months

## 2018-05-23 NOTE — Progress Notes (Signed)
Pre visit review using our clinic review tool, if applicable. No additional management support is needed unless otherwise documented below in the visit note. 

## 2018-05-23 NOTE — Progress Notes (Signed)
Subjective:    Patient ID: Juan [name], male    DOB: 1941/03/21, 78 y.o.   MRN: [id number]  DOS:  05/23/2018 Type of visit - description: Acute CHF: Was recommended to increase Lasix dose but he is not doing it. Reports back pain, chronic, moderate.  Currently on Tylenol Lower extremity edema: At baseline  Wt Readings from Last 3 Encounters:  05/23/18 192 lb (87.1 kg)  04/08/18 191 lb (86.6 kg)  04/08/18 205 lb 6 oz (93.2 kg)     Review of Systems Denies fever or chills Occasionally difficulty breathing and chest pain, not unusual or new complaint per pt.  Past Medical History:  Diagnosis Date  . Allergic rhinitis   . Anemia   . Ascending aortic aneurysm ([place]) 03/2014   4.3cm on CT scan  . CAD (coronary artery disease)    dx elsewheer in past, no documentation. Non-ischemic myoview 2007  . Chronic diastolic CHF (congestive heart failure), NYHA class 2 (HCC)    Normal EF w/ grade 1 dd by echo 12/2015  . Chronic respiratory failure with hypoxia/2L at home    "2L; all the time" (05/03/2017)  . CKD (chronic kidney disease), stage III ([place])   . Edema    R>L leg, u/s 5-12 neg for DVT  . Hemorrhoid   . History of hydronephrosis   . History of Splenic infarct   . History of thrombocytosis   . Hypertension   . Iron deficiency anemia due to chronic blood loss 03/31/2017  . 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 Migraine    "once/wk at least" (07/11/2013)  . Moderate to severe pulmonary hypertension ([place])   . Myeloproliferative disease ([place])   . Pyelonephritis 04/29/2017  . Recurrent Pseudomonas urinary tract infection   . Shortness of breath   . Sinus congestion   . Sleep apnea, obstructive    at some point used CPAP, was d/c  years ago  . Type II diabetes mellitus ([place])   . UTI (urinary tract infection) 06/2017  . UTI (urinary tract infection) 07/2017    Past Surgical History:  Procedure Laterality Date  . FLEXIBLE SIGMOIDOSCOPY N/A 06/08/2017   Procedure: FLEXIBLE  SIGMOIDOSCOPY;  Surgeon: Carol Ada, MD;  Location: [facility];  Service: Endoscopy;  Laterality: N/A;  . IR FLUORO GUIDE CV LINE RIGHT  05/07/2017  . IR FLUORO RM 30-60 MIN  05/07/2017  . IR US GUIDE VASC ACCESS RIGHT  05/07/2017  . TOE SURGERY Right    "tried to straighten out big toe" (07/11/2013)    Social History   Socioeconomic History  . Marital status: Widowed    Spouse name: Not on file  . Number of children: 2  . Years of education: Not on file  . Highest education level: Not on file  Occupational History  . Occupation: retired     Fish farm manager: RETIRED  Social Needs  . Financial resource strain: Not on file  . Food insecurity:    Worry: Not on file    Inability: Not on file  . Transportation needs:    Medical: Not on file    Non-medical: Not on file  Tobacco Use  . Smoking status: Former Smoker    Packs/day: 0.25    Years: 12.00    Pack years: 3.00    Types: Cigarettes    Last attempt to quit: 05/18/1966    Years since quitting: 52.0  . Smokeless tobacco: Never Used  . Tobacco comment: quit smoking 45 years ago  Substance and Sexual  Activity  . Alcohol use: No    Alcohol/week: 0.0 standard drinks  . Drug use: No  . Sexual activity: Not Currently  Lifestyle  . Physical activity:    Days per week: Not on file    Minutes per session: Not on file  . Stress: Not on file  Relationships  . Social connections:    Talks on phone: Not on file    Gets together: Not on file    Attends religious service: Not on file    Active member of club or organization: Not on file    Attends meetings of clubs or organizations: Not on file    Relationship status: Not on file  . Intimate partner violence:    Fear of current or ex partner: Not on file    Emotionally abused: Not on file    Physically abused: Not on file    Forced sexual activity: Not on file  Other Topics Concern  . Not on file  Social History Narrative   Moved from [place] 2006   Widow 2007   Son lives with  him    Lives in an apartment , first floor     Former smoker-stopped 1968   Alcohol none   Full Code      Allergies as of 05/23/2018   No Known Allergies     Medication List       Accurate as of May 23, 2018  8:28 PM. Always use your most recent med list.        acetaminophen 325 MG tablet Commonly known as:  TYLENOL Take 2 tablets (650 mg total) by mouth every 6 (six) hours as needed for mild pain (or Fever >/= 101).   aspirin EC 325 MG tablet Take 325 mg by mouth daily.   b complex vitamins tablet Take 1 tablet by mouth daily.   benzonatate 100 MG capsule Commonly known as:  TESSALON Take 1 capsule (100 mg total) by mouth 3 (three) times daily as needed for cough.   clotrimazole 1 % cream Commonly known as:  LOTRIMIN AF Apply to Lt foot (between) Toes bid for athletes foot   ferrous gluconate 240 (27 FE) MG tablet Commonly known as:  FERGON Take 240 mg by mouth daily.   furosemide 40 MG tablet Commonly known as:  LASIX Take 80 mg (2 tab) in morning and 40mg  (1tab) in evening   hydrALAZINE 50 MG tablet Commonly known as:  APRESOLINE Take 1.5 tablets (75 mg total) by mouth 3 (three) times daily.   hydrocortisone cream 1 % Apply 1 application topically 2 (two) times daily.   miconazole 2 % powder Commonly known as:  MICOTIN Apply 1 application topically as needed for itching.   mouth rinse Liqd solution 15 mLs by Mouth Rinse route 2 (two) times daily.   ondansetron 4 MG tablet Commonly known as:  ZOFRAN Take 1 tablet (4 mg total) by mouth every 6 (six) hours as needed for nausea or vomiting.   OXYGEN Inhale 2 L into the lungs continuous.   tamsulosin 0.4 MG Caps capsule Commonly known as:  FLOMAX Take 1 capsule (0.4 mg total) by mouth daily after supper.           Objective:   Physical Exam BP 138/84 (BP Location: Right Wrist, Patient Position: Sitting, Cuff Size: Small)   Pulse 73   Temp 97.9 F (36.6 C) (Oral)   Resp 16   Ht 6\' 3"   (1.905 m)   Wt 192  lb (87.1 kg) Comment: Per Pt  SpO2 96%   BMI 24.00 kg/m  General:   Well developed, NAD, BMI noted. HEENT:  Normocephalic . Face symmetric, atraumatic Lungs:  CTA B Normal respiratory effort, no intercostal retractions, no accessory muscle use. Heart: RRR,  no murmur.  +/+++ pretibial edema bilaterally  Skin: Not pale. Not jaundice Neurologic:  alert & oriented X3.  Speech normal, gait not tested. Psych--  Cognition and judgment appear intact.  Cooperative with normal attention span and concentration.  Behavior appropriate. No anxious or depressed appearing.      Assessment     Assessment Prediabetes  Neuropathy, on Neurontin prn and hydrocodone prn HTN CKD, creatinine ~ 1.7-1.9 Chronic edema and stasis dermatitis Morbid obesity CV: --CHF, EF 30% 01-2015, normal Myoview 2007, refuse it cardiac catheterization --Ascending aortic aneurysm per CT angio 03-2014, 4.3 cm --CT chest without done 06-2014 --  Refused MRA of the Ao 2016  --pulmonary hypertension: 10-2016: V/Q can neg, ANA-RF-ANCa (-) Hypoxia: On home oxygen since admission 10/2016 Hem-onc: ---Myeloproliferative disease , refuses treatment --- anemia --- thrombocytosiscytosis  GU: BPH- bladder outlet obstruction-chronic Foley Per urology 2017: "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 They recommend continuing Foley catheter, change every 4 weeks " OSA, used a CPAP at some point Poor compliance with advice:  Saw psychiatry 04/07/2014 at the hospital and deemed to be competent to make his own decisions. Deemed completed again March 2019  PLAN: CHF: Seems to be stable based on symptoms and his weight. Currently taking Lasix 40 mg: 2 tablets in the morning and 1 in the afternoon; was recommended to increase the dose but he did not, at this point he is doing well, weight is a stable  consequently we continue with present care. Anemia: Last CBC stable Hospital bed: The patient has CHF a medical condition that requires to position his body in ways not feasible with an ordinary bed.  He needs to elevate the head of the bed more than 30 degrees due to CHF, also needs to elevate his legs. In addition, he has anemia and weakness.

## 2018-06-10 ENCOUNTER — Inpatient Hospital Stay: Payer: Medicare Other

## 2018-06-10 ENCOUNTER — Encounter: Payer: Self-pay | Admitting: Hematology

## 2018-06-10 ENCOUNTER — Inpatient Hospital Stay: Payer: Medicare Other | Attending: Hematology & Oncology | Admitting: Hematology

## 2018-06-10 VITALS — BP 125/43 | HR 67 | Temp 98.5°F | Resp 16 | Ht 75.0 in | Wt 202.0 lb

## 2018-06-10 DIAGNOSIS — M7989 Other specified soft tissue disorders: Secondary | ICD-10-CM | POA: Diagnosis not present

## 2018-06-10 DIAGNOSIS — Z79899 Other long term (current) drug therapy: Secondary | ICD-10-CM | POA: Diagnosis not present

## 2018-06-10 DIAGNOSIS — Z7982 Long term (current) use of aspirin: Secondary | ICD-10-CM

## 2018-06-10 DIAGNOSIS — D696 Thrombocytopenia, unspecified: Secondary | ICD-10-CM | POA: Insufficient documentation

## 2018-06-10 DIAGNOSIS — N390 Urinary tract infection, site not specified: Secondary | ICD-10-CM

## 2018-06-10 DIAGNOSIS — D7581 Myelofibrosis: Secondary | ICD-10-CM

## 2018-06-10 DIAGNOSIS — N183 Chronic kidney disease, stage 3 unspecified: Secondary | ICD-10-CM

## 2018-06-10 DIAGNOSIS — D72829 Elevated white blood cell count, unspecified: Secondary | ICD-10-CM | POA: Diagnosis not present

## 2018-06-10 DIAGNOSIS — D649 Anemia, unspecified: Secondary | ICD-10-CM | POA: Diagnosis not present

## 2018-06-10 DIAGNOSIS — I872 Venous insufficiency (chronic) (peripheral): Secondary | ICD-10-CM

## 2018-06-10 LAB — CMP (CANCER CENTER ONLY)
ALBUMIN: 4.1 g/dL (ref 3.5–5.0)
ALT: 28 U/L (ref 0–44)
ANION GAP: 8 (ref 5–15)
AST: 26 U/L (ref 15–41)
Alkaline Phosphatase: 122 U/L (ref 38–126)
BUN: 68 mg/dL — AB (ref 8–23)
CO2: 25 mmol/L (ref 22–32)
Calcium: 10.5 mg/dL — ABNORMAL HIGH (ref 8.9–10.3)
Chloride: 106 mmol/L (ref 98–111)
Creatinine: 1.41 mg/dL — ABNORMAL HIGH (ref 0.61–1.24)
GFR, Est AFR Am: 56 mL/min — ABNORMAL LOW (ref >60)
GFR, Estimated: 48 mL/min — ABNORMAL LOW (ref >60)
GLUCOSE: 153 mg/dL — AB (ref 70–99)
POTASSIUM: 4 mmol/L (ref 3.5–5.1)
SODIUM: 139 mmol/L (ref 135–145)
Total Bilirubin: 0.3 mg/dL (ref 0.3–1.2)
Total Protein: 6.2 g/dL — ABNORMAL LOW (ref 6.5–8.1)

## 2018-06-10 LAB — CBC WITH DIFFERENTIAL (CANCER CENTER ONLY)
ABS IMMATURE GRANULOCYTES: 3.4 10*3/uL — AB (ref 0.00–0.07)
BASOS ABS: 0.2 10*3/uL — AB (ref 0.0–0.1)
Band Neutrophils: 13 %
Basophils Relative: 1 %
EOS ABS: 0.2 10*3/uL (ref 0.0–0.5)
Eosinophils Relative: 1 %
HCT: 32.9 % — ABNORMAL LOW (ref 39.0–52.0)
Hemoglobin: 9.7 g/dL — ABNORMAL LOW (ref 13.0–17.0)
LYMPHS ABS: 3.4 10*3/uL (ref 0.7–4.0)
Lymphocytes Relative: 15 %
MCH: 25.7 pg — ABNORMAL LOW (ref 26.0–34.0)
MCHC: 29.5 g/dL — AB (ref 30.0–36.0)
MCV: 87.3 fL (ref 80.0–100.0)
METAMYELOCYTES PCT: 7 %
MONOS PCT: 15 %
Monocytes Absolute: 3.4 10*3/uL — ABNORMAL HIGH (ref 0.1–1.0)
Myelocytes: 7 %
NEUTROS ABS: 12.1 10*3/uL — AB (ref 1.7–7.7)
Neutrophils Relative %: 40 %
Platelet Count: 131 10*3/uL — ABNORMAL LOW (ref 150–400)
Promyelocytes Relative: 1 %
RBC: 3.77 MIL/uL — ABNORMAL LOW (ref 4.22–5.81)
RDW: 19.4 % — AB (ref 11.5–15.5)
WBC Count: 22.8 10*3/uL — ABNORMAL HIGH (ref 4.0–10.5)
nRBC: 2.9 % — ABNORMAL HIGH (ref 0.0–0.2)

## 2018-06-10 LAB — LACTATE DEHYDROGENASE: LDH: 887 U/L — ABNORMAL HIGH (ref 98–192)

## 2018-06-10 NOTE — Progress Notes (Signed)
[place] OFFICE PROGRESS NOTE  Patient Care Team: Colon Branch, MD as PCP - General [name], MD as PCP - Cardiology (Cardiology) Roxana Hires, MD as Consulting Physician (Hematology and Oncology) [name], MD as Consulting Physician (Hematology and Oncology) Ardis Hughs, MD as Attending Physician (Urology)  HEME/ONC OVERVIEW: 1. 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 -MP[facility] NGS showed CAL-R mutation, consistent with history of ET, with subsequent evolution to myelofibrosis -Ruxolitinib discussed in the past, but deferred due to the patient's poor performance status and multiple comorbidities -Labs overall unchanged today -Patient denies any progressive symptoms related to myelofibrosis -I had discussed with the patient and his son on several occasions regarding the poor prognosis of myelofibrosis, but they have limited health literacy and do not appear to comprehend the diagnosis or the seriousness of his disease -We will continue to monitor his labs for now   Normocytic anemia -Hgb 9.7, stable -Patient denies any symptoms of bleeding -No indication for [name] today -We will monitor for now  Leukocytosis -WBC 22.8 today, improving since end of 2019 -See discussion of bilateral lower extremity swelling below -Clinically, patient does not have any symptoms or signs of infection -We will monitor for now  Thrombocytopenia -Plts 131k today, downtrending since 2019 -Review of patient's CBC's showed fluctuations in platelet  count between 120 to 200k's -Patient denies any symptoms abnormal bleeding or bruising -We will monitor for now  Bilateral lower extremity swelling  -Chronic, likely secondary to venous insufficiency -Bilateral lower extremity swelling from the feet to the knees, L slightly more pronounced than the R; no skin weeping, erythema or breakdown  -I discussed at length with the patient that the bilateral lower extremity swelling is most likely due to venous insufficiency, and he would benefit from compression stockings to alleviate the swelling and pain -However, patient was adamant that he could not wear compression stockings according to instructions from a physician he saw in [place] over 10 years ago, and asked for free samples of antibiotics, which he believed would improve the swelling -I explained to the patient in detail at that antibiotics would not be beneficial in absence of evidence for cellulitis; furthermore, given his history of multidrug-resistant Pseudomonas UTI, antibiotics may create further drug resistance, and therefore should be used judiciously -Patient became agitated and upset when he was informed that antibiotics was not indicated at this time, and stated that he would just go to the emergency department to get better -I encouraged patient to follow-up with PCP for further evaluation; furthermore, if he develop any symptoms of infection, such as fever, weeping or drainage in the lower extremities, erythema or skin breakdown, he should seek further evaluation promptly  Hx of recurrent UTI  -Patient has had several admissions for Pseudomonas UTIs requiring IV antibiotics in late 2019 -He currently denies any recurrent symptoms of persistent dysuria or hematuria -We will continue to monitor for now  CKD III -Cr 1.41, stable -Electrolytes nl -We will monitor it for now   Orders Placed This Encounter  Procedures  . CBC with Differential (  [place] Only)    Standing  Status:   Future    Standing Expiration Date:   07/15/2019  . CMP ([facility] only)    Standing Status:   Future    Standing Expiration Date:   07/15/2019  . Save Smear (SSMR)    Standing Status:   Future    Standing Expiration Date:   06/11/2019  . Lactate dehydrogenase    Standing Status:   Future    Standing Expiration Date:   07/15/2019  . Erythropoietin    Standing Status:   Future    Standing Expiration Date:   06/10/2019   A total of more than 40 minutes were spent face-to-face with the patient during this encounter and over half of that time was spent on counseling and coordination of care as outlined above.  All questions were answered. The patient knows to call the clinic with any problems, questions or concerns. No barriers to learning was detected.  Return in 2 months for labs, possible [name] injection as needed, and clinic follow-up.   Tish Men, MD 06/10/2018 1:34 PM  CHIEF COMPLAINT: "My legs are swollen"  INTERVAL HISTORY: Juan [name] returns to clinic for follow-up of myelofibrosis secondary to ET.  Patient reports that since his discharge in 03/2018 for recurrent UTI, he has had persistent bilateral lower extremity swelling, left greater than right, with mild pain with palpation due to swelling.  He was told over 10 years ago by physician in [place] that compression stockings are "bad for my heart" and therefore has refused to wear lower extremity compression stockings.  He also reports persistent fatigue, but denies any fever, night sweats, lymphadenopathy, weight loss, chest pain, dyspnea, abdominal pain, nausea, vomiting,or diarrhea.  He asked for free antibiotic samples for the lower extremity swelling.  He denies any complaint today.  REVIEW OF SYSTEMS:   Constitutional: ( - ) fevers, ( - )  chills , ( - ) night sweats Eyes: ( - ) blurriness of vision, ( - ) double vision, ( - ) watery eyes Ears, nose, mouth, throat, and face: ( - ) mucositis, ( - ) sore  throat Respiratory: ( - ) cough, ( - ) dyspnea, ( - ) wheezes Cardiovascular: ( - ) palpitation, ( - ) chest discomfort, ( + ) lower extremity swelling Gastrointestinal:  ( - ) nausea, ( - ) heartburn, ( - ) change in bowel habits Skin: ( - ) abnormal skin rashes Lymphatics: ( - ) new lymphadenopathy, ( - ) easy bruising Neurological: ( - ) numbness, ( - ) tingling, ( - ) new weaknesses Behavioral/Psych: ( - ) mood change, ( - ) new changes  All other systems were reviewed with the patient and are negative.  I have reviewed the past medical history, past surgical history, social history and family history with the patient and they are unchanged from previous note.  ALLERGIES:  has No Known Allergies.  MEDICATIONS:  Current Outpatient Medications  Medication Sig Dispense Refill  . acetaminophen (TYLENOL) 325 MG tablet Take 2 tablets (650 mg total) by mouth every 6 (six) hours as needed for mild pain (or Fever >/= 101).    Marland Kitchen aspirin EC 325 MG tablet Take 325 mg by mouth daily.     Marland Kitchen b complex vitamins tablet Take 1 tablet by mouth daily.    . benzonatate (TESSALON) 100 MG capsule Take 1 capsule (100 mg total) by mouth 3 (three) times daily as needed for cough. 30 capsule  0  . clotrimazole (LOTRIMIN AF) 1 % cream Apply to Lt foot (between) Toes bid for athletes foot (Patient taking differently: Apply 1 application topically 2 (two) times daily. Apply to left foot (between) toes twice daily for athletes foot.) 28 g 0  . ferrous gluconate (FERGON) 240 (27 FE) MG tablet Take 240 mg by mouth daily.    . furosemide (LASIX) 40 MG tablet Take 80 mg (2 tab) in morning and 40mg  (1tab) in evening (Patient taking differently: Take 40-80 mg by mouth See admin instructions. Take 80 mg (2 tablets) by mouth in morning and 40mg  (1 tablet) in evening.) 90 tablet 1  . hydrALAZINE (APRESOLINE) 50 MG tablet Take 1.5 tablets (75 mg total) by mouth 3 (three) times daily. 135 tablet 2  . hydrocortisone cream 1 %  Apply 1 application topically 2 (two) times daily.    . miconazole (MICOTIN) 2 % powder Apply 1 application topically as needed for itching.     . mouth rinse LIQD solution 15 mLs by Mouth Rinse route 2 (two) times daily. 118 mL 0  . ondansetron (ZOFRAN) 4 MG tablet Take 1 tablet (4 mg total) by mouth every 6 (six) hours as needed for nausea or vomiting. 10 tablet 0  . OXYGEN Inhale 2 L into the lungs continuous.    . tamsulosin (FLOMAX) 0.4 MG CAPS capsule Take 1 capsule (0.4 mg total) by mouth daily after supper. 30 capsule 0   No current facility-administered medications for this visit.     PHYSICAL EXAMINATION: ECOG PERFORMANCE STATUS: 3 - Symptomatic, >50% confined to bed  Today's Vitals   06/10/18 1000  BP: (!) 125/43  Pulse: 67  Resp: 16  Temp: 98.5 F (36.9 C)  TempSrc: Oral  SpO2: 99%  Weight: 202 lb (91.6 kg)  Height: 6\' 3"  (1.905 m)  PainSc: 0-No pain   Body mass index is 25.25 kg/m.  Filed Weights   06/10/18 1000  Weight: 202 lb (91.6 kg)    GENERAL: alert, no distress and comfortable, chronically ill appearing, sitting in a wheelchair SKIN: mild chronic venous stasis changes in the lower extremities, no erythema, skin breakdown or purulent drainage or weeping in the lower extremities  EYES: conjunctiva are pink and non-injected, sclera clear OROPHARYNX: no exudate, no erythema; lips, buccal mucosa, and tongue normal  NECK: supple, non-tender LUNGS: clear to auscultation with normal breathing effort HEART: regular rate & rhythm and no murmurs and 2+ bilateral lower extremity edema, L slightly greater than the R ABDOMEN: soft, non-tender, non-distended, normal bowel sounds Musculoskeletal: no cyanosis of digits and no clubbing  PSYCH: alert & oriented x 3, slightly mumbled speech  LABORATORY DATA:  I have reviewed the data as listed    Component Value Date/Time   NA 139 06/10/2018 1001   NA 142 02/28/2018 1202   NA 143 03/30/2017 1420   NA 138 11/16/2013  0847   K 4.0 06/10/2018 1001   K 4.3 03/30/2017 1420   K 4.5 11/16/2013 0847   CL 106 06/10/2018 1001   CL 110 (H) 03/30/2017 1420   CO2 25 06/10/2018 1001   CO2 27 03/30/2017 1420   CO2 22 11/16/2013 0847   GLUCOSE 153 (H) 06/10/2018 1001   GLUCOSE 106 03/30/2017 1420   BUN 68 (H) 06/10/2018 1001   BUN 68 (H) 02/28/2018 1202   BUN 38 (H) 03/30/2017 1420   BUN 20.2 11/16/2013 0847   CREATININE 1.41 (H) 06/10/2018 1001   CREATININE 1.3 (H) 03/30/2017 1420  CREATININE 1.2 11/16/2013 0847   CALCIUM 10.5 (H) 06/10/2018 1001   CALCIUM 9.8 03/30/2017 1420   CALCIUM 9.8 11/16/2013 0847   PROT 6.2 (L) 06/10/2018 1001   PROT 6.0 (L) 03/30/2017 1420   PROT 6.7 11/16/2013 0847   ALBUMIN 4.1 06/10/2018 1001   ALBUMIN 2.8 (L) 03/30/2017 1420   ALBUMIN 3.4 (L) 11/16/2013 0847   AST 26 06/10/2018 1001   AST 25 11/16/2013 0847   ALT 28 06/10/2018 1001   ALT 22 03/30/2017 1420   ALT 11 11/16/2013 0847   ALKPHOS 122 06/10/2018 1001   ALKPHOS 116 (H) 03/30/2017 1420   ALKPHOS 125 11/16/2013 0847   BILITOT 0.3 06/10/2018 1001   BILITOT 0.35 11/16/2013 0847   GFRNONAA 48 (L) 06/10/2018 1001   GFRAA 56 (L) 06/10/2018 1001    No results found for: SPEP, UPEP  Lab Results  Component Value Date   WBC 22.8 (H) 06/10/2018   NEUTROABS 12.1 (H) 06/10/2018   HGB 9.7 (L) 06/10/2018   HCT 32.9 (L) 06/10/2018   MCV 87.3 06/10/2018   PLT 131 (L) 06/10/2018      Chemistry      Component Value Date/Time   NA 139 06/10/2018 1001   NA 142 02/28/2018 1202   NA 143 03/30/2017 1420   NA 138 11/16/2013 0847   K 4.0 06/10/2018 1001   K 4.3 03/30/2017 1420   K 4.5 11/16/2013 0847   CL 106 06/10/2018 1001   CL 110 (H) 03/30/2017 1420   CO2 25 06/10/2018 1001   CO2 27 03/30/2017 1420   CO2 22 11/16/2013 0847   BUN 68 (H) 06/10/2018 1001   BUN 68 (H) 02/28/2018 1202   BUN 38 (H) 03/30/2017 1420   BUN 20.2 11/16/2013 0847   CREATININE 1.41 (H) 06/10/2018 1001   CREATININE 1.3 (H) 03/30/2017  1420   CREATININE 1.2 11/16/2013 0847   GLU 93 05/24/2017      Component Value Date/Time   CALCIUM 10.5 (H) 06/10/2018 1001   CALCIUM 9.8 03/30/2017 1420   CALCIUM 9.8 11/16/2013 0847   ALKPHOS 122 06/10/2018 1001   ALKPHOS 116 (H) 03/30/2017 1420   ALKPHOS 125 11/16/2013 0847   AST 26 06/10/2018 1001   AST 25 11/16/2013 0847   ALT 28 06/10/2018 1001   ALT 22 03/30/2017 1420   ALT 11 11/16/2013 0847   BILITOT 0.3 06/10/2018 1001   BILITOT 0.35 11/16/2013 0847       RADIOGRAPHIC STUDIES: I have personally reviewed the radiological images as listed below and agreed with the findings in the report. No results found.

## 2018-06-20 ENCOUNTER — Other Ambulatory Visit (HOSPITAL_COMMUNITY): Payer: Self-pay | Admitting: Student

## 2018-06-20 MED FILL — hydrALAZINE HCL 50 MG TABS: 50 | 30 days supply | Qty: 135 | Fill #1

## 2018-06-20 MED FILL — FUROSEMIDE 40 MG TAB: 40 | 30 days supply | Qty: 90 | Fill #0

## 2018-06-20 NOTE — Progress Notes (Signed)
Cardiology Office Note   Date:  06/27/2018   ID:  Juan [name], DOB 1941-01-09, MRN [id number]  PCP:  Colon Branch, MD  Cardiologist:  Jenkins Rouge, MD   No chief complaint on file.     History of Present Illness:  78 y.o. history of CHF, HTN, DM-2, OSA, CKD, GI bleed, myeloproliferative disorder I have note seen him since 2015 Seen more recently by PA and Dr Jeffie Pollock  TTE 02/07/18 improved EF 99-83% grade one diatolic with atrial enlargement Uses home oxygen. From CXR done 03/24/18 presumed ILD  Sees Ennever for myeloproliferative disorder with elevated white count but refuse Rx Has chronic LE edema from Venous insufficiency Gets frequent UTI"s with Pseudomonas   May have had distant cath in [place] but no recent evaluation for CAD Non ischemic myovue in 2007 No chest pain   Has more LE edema. Discussed increasing lasix and f/u with primary who follows Labs and kidneys. If 80 bid lasix dose not help would change to demedex   Past Medical History:  Diagnosis Date  . Allergic rhinitis   . Anemia   . Ascending aortic aneurysm ([place]) 03/2014   4.3cm on CT scan  . CAD (coronary artery disease)    dx elsewheer in past, no documentation. Non-ischemic myoview 2007  . Chronic diastolic CHF (congestive heart failure), NYHA class 2 (HCC)    Normal EF w/ grade 1 dd by echo 12/2015  . Chronic respiratory failure with hypoxia/2L at home    "2L; all the time" (05/03/2017)  . CKD (chronic kidney disease), stage III ([place])   . Edema    R>L leg, u/s 5-12 neg for DVT  . Hemorrhoid   . History of hydronephrosis   . History of Splenic infarct   . History of thrombocytosis   . Hypertension   . Iron deficiency anemia due to chronic blood loss 03/31/2017  . 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 Migraine    "once/wk at least" (07/11/2013)  . Moderate to severe pulmonary hypertension ([place])   . Myeloproliferative disease ([place])   . Pyelonephritis 04/29/2017  . Recurrent Pseudomonas urinary tract  infection   . Shortness of breath   . Sinus congestion   . Sleep apnea, obstructive    at some point used CPAP, was d/c  years ago  . Type II diabetes mellitus ([place])   . UTI (urinary tract infection) 06/2017  . UTI (urinary tract infection) 07/2017    Past Surgical History:  Procedure Laterality Date  . FLEXIBLE SIGMOIDOSCOPY N/A 06/08/2017   Procedure: FLEXIBLE SIGMOIDOSCOPY;  Surgeon: Carol Ada, MD;  Location: [facility];  Service: Endoscopy;  Laterality: N/A;  . IR FLUORO GUIDE CV LINE RIGHT  05/07/2017  . IR FLUORO RM 30-60 MIN  05/07/2017  . IR US GUIDE VASC ACCESS RIGHT  05/07/2017  . TOE SURGERY Right    "tried to straighten out big toe" (07/11/2013)     Current Outpatient Medications  Medication Sig Dispense Refill  . acetaminophen (TYLENOL) 325 MG tablet Take 2 tablets (650 mg total) by mouth every 6 (six) hours as needed for mild pain (or Fever >/= 101).    Marland Kitchen b complex vitamins tablet Take 1 tablet by mouth daily.    . benzonatate (TESSALON) 100 MG capsule Take 1 capsule (100 mg total) by mouth 3 (three) times daily as needed for cough. 30 capsule 0  . clotrimazole (LOTRIMIN AF) 1 % cream Apply to Lt foot (between) Toes bid for athletes foot (Patient  taking differently: Apply 1 application topically 2 (two) times daily. Apply to left foot (between) toes twice daily for athletes foot.) 28 g 0  . ferrous gluconate (FERGON) 240 (27 FE) MG tablet Take 240 mg by mouth daily.    . furosemide (LASIX) 80 MG tablet Take 1 tablet (80 mg total) by mouth 2 (two) times daily. 180 tablet 3  . hydrALAZINE (APRESOLINE) 50 MG tablet Take 1.5 tablets (75 mg total) by mouth 3 (three) times daily. 135 tablet 2  . hydrocortisone cream 1 % Apply 1 application topically 2 (two) times daily.    . miconazole (MICOTIN) 2 % powder Apply 1 application topically as needed for itching.     . mouth rinse LIQD solution 15 mLs by Mouth Rinse route 2 (two) times daily. 118 mL 0  . ondansetron (ZOFRAN)  4 MG tablet Take 1 tablet (4 mg total) by mouth every 6 (six) hours as needed for nausea or vomiting. 10 tablet 0  . OXYGEN Inhale 2 L into the lungs continuous.    . tamsulosin (FLOMAX) 0.4 MG CAPS capsule Take 1 capsule (0.4 mg total) by mouth daily after supper. 30 capsule 0  . aspirin EC 81 MG tablet Take 1 tablet (81 mg total) by mouth daily.     No current facility-administered medications for this visit.     Allergies:   Patient has no known allergies.    Social History:  The patient  reports that he quit smoking about 52 years ago. His smoking use included cigarettes. He has a 3.00 pack-year smoking history. He has never used smokeless tobacco. He reports that he does not drink alcohol or use drugs.   Family History:  The patient's family history includes Mental illness in his daughter; Schizophrenia in his son.    ROS:  Please see the history of present illness.   Otherwise, review of systems are positive for none.   All other systems are reviewed and negative.    PHYSICAL EXAM: VS:  BP (!) 154/66   Pulse (!) 58   Ht 6\' 3"  (1.905 m)   Wt 204 lb (92.5 kg)   BMI 25.50 kg/m  , BMI Body mass index is 25.5 kg/m. Affect appropriate Chronically il male  HEENT: normal Neck supple with no adenopathy JVP normal no bruits no thyromegaly Lungs basilar crackles  no wheezing and good diaphragmatic motion Heart:  S1/S2 no murmur, no rub, gallop or click PMI normal Abdomen: benighn, BS positve, no tenderness, no AAA no bruit.  No HSM or HJR Distal pulses intact with no bruits Plus one bilateral  Edema left worse than right plus 2 Neuro non-focal Skin warm and dry No muscular weakness   EKG:   1st degree AV block, incomplete RBBB, LVH pattern, no STE/D   Recent Labs: 02/28/2018: Magnesium 2.6 04/01/2018: B Natriuretic Peptide 92.4 06/10/2018: ALT 28; BUN 68; Creatinine 1.41; Hemoglobin 9.7; Platelet Count 131; Potassium 4.0; Sodium 139    Lipid Panel    Component Value  Date/Time   CHOL 176 11/14/2016 0504   TRIG 56 11/14/2016 0504   TRIG 79 04/05/2006 1115   HDL 59 11/14/2016 0504   CHOLHDL 3.0 11/14/2016 0504   VLDL 11 11/14/2016 0504   LDLCALC 106 (H) 11/14/2016 0504      Wt Readings from Last 3 Encounters:  06/27/18 204 lb (92.5 kg)  06/10/18 202 lb (91.6 kg)  05/23/18 192 lb (87.1 kg)      Other studies Reviewed: Additional studies/  records that were reviewed today include:   Echocardiogram 02/07/18: ------------------------------------------------------------------- Study Conclusions  - Left ventricle: The cavity size was at the upper limits of   normal. Wall thickness was increased in a pattern of mild LVH.   Systolic function was normal. The estimated ejection fraction was   in the range of 50% to 55%. Wall motion was normal; there were no   regional wall motion abnormalities. Doppler parameters are   consistent with abnormal left ventricular relaxation (grade 1   diastolic dysfunction). - Mitral valve: Calcified annulus. - Left atrium: The atrium was moderately dilated. - Right atrium: The atrium was mildly dilated.   ASSESSMENT AND PLAN:  1. Chronic combined CHF: Last echo 02/07/18 with EF 50-55% increase Lasix to 80 bid for edema f/u labs with primary  2. HTN: Well controlled.  Continue current medications and low sodium Dash type diet.   3. CKD stage 3: Cr  Stable 1.41 on 06/10/18  4. DM type 2: Discussed low carb diet.  Target hemoglobin A1c is 6.5 or less.  Continue current medications. 5. Myeloproliferative disorder: F/U En[facility] WBC 22.8 refused Rx at risk for acute leukemia  6. BPH c/b urinary retention and frequent UTIs: continue flomax f/u urology    Current medicines are reviewed at length with the patient today.  The patient does not have concerns regarding medicines.  The following changes have been made:  Lasix increased 80 bid  Labs/ tests ordered today include: None   No orders of the defined types were  placed in this encounter.    Disposition:   FU with me in a year   Signed, Jenkins Rouge, MD  06/27/2018 10:20 AM

## 2018-06-27 ENCOUNTER — Ambulatory Visit (INDEPENDENT_AMBULATORY_CARE_PROVIDER_SITE_OTHER): Payer: Medicare Other | Admitting: Cardiovascular Disease

## 2018-06-27 VITALS — BP 154/66 | HR 58 | Ht 75.0 in | Wt 204.0 lb

## 2018-06-27 DIAGNOSIS — I5032 Chronic diastolic (congestive) heart failure: Secondary | ICD-10-CM | POA: Diagnosis not present

## 2018-06-27 DIAGNOSIS — I10 Essential (primary) hypertension: Secondary | ICD-10-CM

## 2018-06-27 DIAGNOSIS — D471 Chronic myeloproliferative disease: Secondary | ICD-10-CM | POA: Diagnosis not present

## 2018-06-27 DIAGNOSIS — N183 Chronic kidney disease, stage 3 unspecified: Secondary | ICD-10-CM

## 2018-06-27 DIAGNOSIS — I5042 Chronic combined systolic (congestive) and diastolic (congestive) heart failure: Secondary | ICD-10-CM

## 2018-06-27 MED ORDER — ASPIRIN EC 81 MG PO TBEC: 81.0000 mg | DELAYED_RELEASE_TABLET | Freq: Every day | ORAL | Status: AC

## 2018-06-27 MED ORDER — FUROSEMIDE 80 MG PO TABS: 80.0000 mg | ORAL_TABLET | Freq: Two times a day (BID) | ORAL | 3 refills | Status: AC

## 2018-06-27 MED FILL — FUROSEMIDE 80 MG TABLET: 80 | 30 days supply | Qty: 60 | Fill #0

## 2018-06-27 NOTE — Patient Instructions (Addendum)
Medication Instructions:  Your physician has recommended you make the following change in your medication:  1-Increase furosemide 80 mg by mouth twice daily. 2-Decrease Aspirin 81 mg by mouth daily.  If you need a refill on your cardiac medications before your next appointment, please call your pharmacy.   Lab work:  If you have labs (blood work) drawn today and your tests are completely normal, you will receive your results only by: Marland Kitchen MyChart Message (if you have MyChart) OR . A paper copy in the mail If you have any lab test that is abnormal or we need to change your treatment, we will call you to review the results.  Testing/Procedures: None ordered today.  Follow-Up: At [facility], you and your health needs are our priority.  As part of our continuing mission to provide you with exceptional heart care, we have created designated Provider Care Teams.  These Care Teams include your primary Cardiologist (physician) and Advanced Practice Providers (APPs -  Physician Assistants and Nurse Practitioners) who all work together to provide you with the care you need, when you need it. You will need a follow up appointment in 12 months.  Please call our office 2 months in advance to schedule this appointment.  You may see Jenkins Rouge, MD or one of the following Advanced Practice Providers on your designated Care Team:   Truitt Merle, NP Cecilie Kicks, NP . Kathyrn Drown, NP

## 2018-07-20 MED FILL — FUROSEMIDE 80 MG TABLET: 80 | 30 days supply | Qty: 60 | Fill #1

## 2018-07-20 MED FILL — hydrALAZINE HCL 50 MG TABS: 50 | 30 days supply | Qty: 135 | Fill #2

## 2018-07-23 ENCOUNTER — Emergency Department (HOSPITAL_COMMUNITY): Payer: Medicare Other

## 2018-07-23 ENCOUNTER — Encounter (HOSPITAL_COMMUNITY): Payer: Self-pay

## 2018-07-23 ENCOUNTER — Other Ambulatory Visit: Payer: Self-pay

## 2018-07-23 ENCOUNTER — Inpatient Hospital Stay (HOSPITAL_COMMUNITY)
Admission: EM | Admit: 2018-07-23 | Discharge: 2018-09-16 | DRG: 870 | Disposition: E | Payer: Medicare Other | Attending: Pulmonary Disease | Admitting: Pulmonary Disease

## 2018-07-23 DIAGNOSIS — D62 Acute posthemorrhagic anemia: Secondary | ICD-10-CM | POA: Diagnosis not present

## 2018-07-23 DIAGNOSIS — Z515 Encounter for palliative care: Secondary | ICD-10-CM

## 2018-07-23 DIAGNOSIS — N390 Urinary tract infection, site not specified: Secondary | ICD-10-CM | POA: Diagnosis not present

## 2018-07-23 DIAGNOSIS — Z87891 Personal history of nicotine dependence: Secondary | ICD-10-CM

## 2018-07-23 DIAGNOSIS — J9811 Atelectasis: Secondary | ICD-10-CM | POA: Diagnosis not present

## 2018-07-23 DIAGNOSIS — Z978 Presence of other specified devices: Secondary | ICD-10-CM

## 2018-07-23 DIAGNOSIS — J810 Acute pulmonary edema: Secondary | ICD-10-CM

## 2018-07-23 DIAGNOSIS — J9621 Acute and chronic respiratory failure with hypoxia: Secondary | ICD-10-CM | POA: Diagnosis not present

## 2018-07-23 DIAGNOSIS — E1151 Type 2 diabetes mellitus with diabetic peripheral angiopathy without gangrene: Secondary | ICD-10-CM | POA: Diagnosis present

## 2018-07-23 DIAGNOSIS — M545 Low back pain: Secondary | ICD-10-CM | POA: Diagnosis not present

## 2018-07-23 DIAGNOSIS — T361X5A Adverse effect of cephalosporins and other beta-lactam antibiotics, initial encounter: Secondary | ICD-10-CM | POA: Diagnosis not present

## 2018-07-23 DIAGNOSIS — A4152 Sepsis due to Pseudomonas: Principal | ICD-10-CM | POA: Diagnosis present

## 2018-07-23 DIAGNOSIS — Z9981 Dependence on supplemental oxygen: Secondary | ICD-10-CM

## 2018-07-23 DIAGNOSIS — I2510 Atherosclerotic heart disease of native coronary artery without angina pectoris: Secondary | ICD-10-CM | POA: Diagnosis present

## 2018-07-23 DIAGNOSIS — E874 Mixed disorder of acid-base balance: Secondary | ICD-10-CM | POA: Diagnosis not present

## 2018-07-23 DIAGNOSIS — N1330 Unspecified hydronephrosis: Secondary | ICD-10-CM | POA: Diagnosis not present

## 2018-07-23 DIAGNOSIS — K8020 Calculus of gallbladder without cholecystitis without obstruction: Secondary | ICD-10-CM | POA: Diagnosis present

## 2018-07-23 DIAGNOSIS — Z1624 Resistance to multiple antibiotics: Secondary | ICD-10-CM | POA: Diagnosis present

## 2018-07-23 DIAGNOSIS — R0989 Other specified symptoms and signs involving the circulatory and respiratory systems: Secondary | ICD-10-CM

## 2018-07-23 DIAGNOSIS — G4733 Obstructive sleep apnea (adult) (pediatric): Secondary | ICD-10-CM | POA: Diagnosis present

## 2018-07-23 DIAGNOSIS — G8929 Other chronic pain: Secondary | ICD-10-CM | POA: Diagnosis present

## 2018-07-23 DIAGNOSIS — G92 Toxic encephalopathy: Secondary | ICD-10-CM | POA: Diagnosis not present

## 2018-07-23 DIAGNOSIS — Z452 Encounter for adjustment and management of vascular access device: Secondary | ICD-10-CM

## 2018-07-23 DIAGNOSIS — D469 Myelodysplastic syndrome, unspecified: Secondary | ICD-10-CM | POA: Diagnosis present

## 2018-07-23 DIAGNOSIS — Z6829 Body mass index (BMI) 29.0-29.9, adult: Secondary | ICD-10-CM

## 2018-07-23 DIAGNOSIS — R001 Bradycardia, unspecified: Secondary | ICD-10-CM | POA: Diagnosis not present

## 2018-07-23 DIAGNOSIS — E876 Hypokalemia: Secondary | ICD-10-CM | POA: Diagnosis present

## 2018-07-23 DIAGNOSIS — R0902 Hypoxemia: Secondary | ICD-10-CM

## 2018-07-23 DIAGNOSIS — R457 State of emotional shock and stress, unspecified: Secondary | ICD-10-CM | POA: Diagnosis not present

## 2018-07-23 DIAGNOSIS — Z818 Family history of other mental and behavioral disorders: Secondary | ICD-10-CM

## 2018-07-23 DIAGNOSIS — I10 Essential (primary) hypertension: Secondary | ICD-10-CM | POA: Diagnosis present

## 2018-07-23 DIAGNOSIS — R502 Drug induced fever: Secondary | ICD-10-CM | POA: Diagnosis not present

## 2018-07-23 DIAGNOSIS — Z87440 Personal history of urinary (tract) infections: Secondary | ICD-10-CM

## 2018-07-23 DIAGNOSIS — J9601 Acute respiratory failure with hypoxia: Secondary | ICD-10-CM

## 2018-07-23 DIAGNOSIS — C946 Myelodysplastic disease, not classified: Secondary | ICD-10-CM | POA: Diagnosis present

## 2018-07-23 DIAGNOSIS — J9611 Chronic respiratory failure with hypoxia: Secondary | ICD-10-CM | POA: Diagnosis present

## 2018-07-23 DIAGNOSIS — G834 Cauda equina syndrome: Secondary | ICD-10-CM | POA: Diagnosis present

## 2018-07-23 DIAGNOSIS — N320 Bladder-neck obstruction: Secondary | ICD-10-CM | POA: Diagnosis present

## 2018-07-23 DIAGNOSIS — R011 Cardiac murmur, unspecified: Secondary | ICD-10-CM | POA: Diagnosis not present

## 2018-07-23 DIAGNOSIS — J690 Pneumonitis due to inhalation of food and vomit: Secondary | ICD-10-CM

## 2018-07-23 DIAGNOSIS — M109 Gout, unspecified: Secondary | ICD-10-CM | POA: Diagnosis present

## 2018-07-23 DIAGNOSIS — R918 Other nonspecific abnormal finding of lung field: Secondary | ICD-10-CM | POA: Diagnosis not present

## 2018-07-23 DIAGNOSIS — K5730 Diverticulosis of large intestine without perforation or abscess without bleeding: Secondary | ICD-10-CM | POA: Diagnosis present

## 2018-07-23 DIAGNOSIS — N184 Chronic kidney disease, stage 4 (severe): Secondary | ICD-10-CM | POA: Diagnosis present

## 2018-07-23 DIAGNOSIS — R5381 Other malaise: Secondary | ICD-10-CM | POA: Diagnosis not present

## 2018-07-23 DIAGNOSIS — N136 Pyonephrosis: Secondary | ICD-10-CM | POA: Diagnosis not present

## 2018-07-23 DIAGNOSIS — D500 Iron deficiency anemia secondary to blood loss (chronic): Secondary | ICD-10-CM | POA: Diagnosis present

## 2018-07-23 DIAGNOSIS — J9600 Acute respiratory failure, unspecified whether with hypoxia or hypercapnia: Secondary | ICD-10-CM

## 2018-07-23 DIAGNOSIS — Y95 Nosocomial condition: Secondary | ICD-10-CM | POA: Diagnosis not present

## 2018-07-23 DIAGNOSIS — N183 Chronic kidney disease, stage 3 unspecified: Secondary | ICD-10-CM | POA: Diagnosis present

## 2018-07-23 DIAGNOSIS — I272 Pulmonary hypertension, unspecified: Secondary | ICD-10-CM | POA: Diagnosis present

## 2018-07-23 DIAGNOSIS — M5489 Other dorsalgia: Secondary | ICD-10-CM | POA: Diagnosis not present

## 2018-07-23 DIAGNOSIS — M549 Dorsalgia, unspecified: Secondary | ICD-10-CM | POA: Diagnosis present

## 2018-07-23 DIAGNOSIS — D7581 Myelofibrosis: Secondary | ICD-10-CM | POA: Diagnosis present

## 2018-07-23 DIAGNOSIS — G40A01 Absence epileptic syndrome, not intractable, with status epilepticus: Secondary | ICD-10-CM | POA: Diagnosis not present

## 2018-07-23 DIAGNOSIS — I480 Paroxysmal atrial fibrillation: Secondary | ICD-10-CM

## 2018-07-23 DIAGNOSIS — D649 Anemia, unspecified: Secondary | ICD-10-CM | POA: Diagnosis present

## 2018-07-23 DIAGNOSIS — I130 Hypertensive heart and chronic kidney disease with heart failure and stage 1 through stage 4 chronic kidney disease, or unspecified chronic kidney disease: Secondary | ICD-10-CM | POA: Diagnosis present

## 2018-07-23 DIAGNOSIS — L8990 Pressure ulcer of unspecified site, unspecified stage: Secondary | ICD-10-CM

## 2018-07-23 DIAGNOSIS — N179 Acute kidney failure, unspecified: Secondary | ICD-10-CM

## 2018-07-23 DIAGNOSIS — Z7982 Long term (current) use of aspirin: Secondary | ICD-10-CM

## 2018-07-23 DIAGNOSIS — B965 Pseudomonas (aeruginosa) (mallei) (pseudomallei) as the cause of diseases classified elsewhere: Secondary | ICD-10-CM | POA: Diagnosis present

## 2018-07-23 DIAGNOSIS — E669 Obesity, unspecified: Secondary | ICD-10-CM | POA: Diagnosis present

## 2018-07-23 DIAGNOSIS — I712 Thoracic aortic aneurysm, without rupture: Secondary | ICD-10-CM | POA: Diagnosis present

## 2018-07-23 DIAGNOSIS — R509 Fever, unspecified: Secondary | ICD-10-CM

## 2018-07-23 DIAGNOSIS — Z1623 Resistance to quinolones and fluoroquinolones: Secondary | ICD-10-CM | POA: Diagnosis present

## 2018-07-23 DIAGNOSIS — J9690 Respiratory failure, unspecified, unspecified whether with hypoxia or hypercapnia: Secondary | ICD-10-CM

## 2018-07-23 DIAGNOSIS — R9401 Abnormal electroencephalogram [EEG]: Secondary | ICD-10-CM | POA: Diagnosis not present

## 2018-07-23 DIAGNOSIS — Z79899 Other long term (current) drug therapy: Secondary | ICD-10-CM

## 2018-07-23 DIAGNOSIS — N401 Enlarged prostate with lower urinary tract symptoms: Secondary | ICD-10-CM | POA: Diagnosis present

## 2018-07-23 DIAGNOSIS — G9340 Encephalopathy, unspecified: Secondary | ICD-10-CM

## 2018-07-23 DIAGNOSIS — I5032 Chronic diastolic (congestive) heart failure: Secondary | ICD-10-CM | POA: Diagnosis present

## 2018-07-23 DIAGNOSIS — E1122 Type 2 diabetes mellitus with diabetic chronic kidney disease: Secondary | ICD-10-CM | POA: Diagnosis present

## 2018-07-23 DIAGNOSIS — R34 Anuria and oliguria: Secondary | ICD-10-CM | POA: Diagnosis not present

## 2018-07-23 DIAGNOSIS — R6520 Severe sepsis without septic shock: Secondary | ICD-10-CM | POA: Diagnosis not present

## 2018-07-23 DIAGNOSIS — D696 Thrombocytopenia, unspecified: Secondary | ICD-10-CM | POA: Diagnosis present

## 2018-07-23 DIAGNOSIS — N170 Acute kidney failure with tubular necrosis: Secondary | ICD-10-CM

## 2018-07-23 DIAGNOSIS — N323 Diverticulum of bladder: Secondary | ICD-10-CM | POA: Diagnosis present

## 2018-07-23 DIAGNOSIS — R162 Hepatomegaly with splenomegaly, not elsewhere classified: Secondary | ICD-10-CM | POA: Diagnosis present

## 2018-07-23 DIAGNOSIS — R680 Hypothermia, not associated with low environmental temperature: Secondary | ICD-10-CM

## 2018-07-23 DIAGNOSIS — Z9289 Personal history of other medical treatment: Secondary | ICD-10-CM

## 2018-07-23 DIAGNOSIS — J449 Chronic obstructive pulmonary disease, unspecified: Secondary | ICD-10-CM | POA: Diagnosis present

## 2018-07-23 DIAGNOSIS — E870 Hyperosmolality and hypernatremia: Secondary | ICD-10-CM | POA: Diagnosis not present

## 2018-07-23 DIAGNOSIS — Z66 Do not resuscitate: Secondary | ICD-10-CM | POA: Diagnosis not present

## 2018-07-23 DIAGNOSIS — N1339 Other hydronephrosis: Secondary | ICD-10-CM | POA: Diagnosis not present

## 2018-07-23 DIAGNOSIS — I471 Supraventricular tachycardia: Secondary | ICD-10-CM | POA: Diagnosis not present

## 2018-07-23 DIAGNOSIS — K828 Other specified diseases of gallbladder: Secondary | ICD-10-CM | POA: Diagnosis present

## 2018-07-23 DIAGNOSIS — I714 Abdominal aortic aneurysm, without rupture: Secondary | ICD-10-CM | POA: Diagnosis present

## 2018-07-23 LAB — URINALYSIS, ROUTINE W REFLEX MICROSCOPIC
Bilirubin Urine: NEGATIVE
Glucose, UA: NEGATIVE mg/dL
Hgb urine dipstick: NEGATIVE
Ketones, ur: NEGATIVE mg/dL
Nitrite: POSITIVE — AB
PH: 5 (ref 5.0–8.0)
Protein, ur: 100 mg/dL — AB
Specific Gravity, Urine: 1.008 (ref 1.005–1.030)
WBC, UA: >50 WBC/hpf — ABNORMAL HIGH (ref 0–5)

## 2018-07-23 LAB — COMPREHENSIVE METABOLIC PANEL
ALT: 19 U/L (ref 0–44)
AST: 23 U/L (ref 15–41)
Albumin: 3.4 g/dL — ABNORMAL LOW (ref 3.5–5.0)
Alkaline Phosphatase: 110 U/L (ref 38–126)
Anion gap: 10 (ref 5–15)
BUN: 70 mg/dL — ABNORMAL HIGH (ref 8–23)
CO2: 24 mmol/L (ref 22–32)
Calcium: 9.9 mg/dL (ref 8.9–10.3)
Chloride: 106 mmol/L (ref 98–111)
Creatinine, Ser: 1.41 mg/dL — ABNORMAL HIGH (ref 0.61–1.24)
GFR calc non Af Amer: 48 mL/min — ABNORMAL LOW (ref >60)
GFR, EST AFRICAN AMERICAN: 56 mL/min — AB (ref >60)
Glucose, Bld: 110 mg/dL — ABNORMAL HIGH (ref 70–99)
Potassium: 3 mmol/L — ABNORMAL LOW (ref 3.5–5.1)
Sodium: 140 mmol/L (ref 135–145)
Total Bilirubin: 0.5 mg/dL (ref 0.3–1.2)
Total Protein: 6.7 g/dL (ref 6.5–8.1)

## 2018-07-23 LAB — CBC WITH DIFFERENTIAL/PLATELET
Abs Immature Granulocytes: 4.87 10*3/uL — ABNORMAL HIGH (ref 0.00–0.07)
Basophils Absolute: 1.1 10*3/uL — ABNORMAL HIGH (ref 0.0–0.1)
Basophils Relative: 4 %
Eosinophils Absolute: 0.3 10*3/uL (ref 0.0–0.5)
Eosinophils Relative: 1 %
HCT: 35.5 % — ABNORMAL LOW (ref 39.0–52.0)
Hemoglobin: 10.3 g/dL — ABNORMAL LOW (ref 13.0–17.0)
Immature Granulocytes: 18 %
Lymphocytes Relative: 13 %
Lymphs Abs: 3.6 10*3/uL (ref 0.7–4.0)
MCH: 25.1 pg — ABNORMAL LOW (ref 26.0–34.0)
MCHC: 29 g/dL — AB (ref 30.0–36.0)
MCV: 86.6 fL (ref 80.0–100.0)
Monocytes Absolute: 5 10*3/uL — ABNORMAL HIGH (ref 0.1–1.0)
Monocytes Relative: 19 %
Neutro Abs: 11.9 10*3/uL — ABNORMAL HIGH (ref 1.7–7.7)
Neutrophils Relative %: 45 %
PLATELETS: 165 10*3/uL (ref 150–400)
RBC: 4.1 MIL/uL — AB (ref 4.22–5.81)
RDW: 18.4 % — ABNORMAL HIGH (ref 11.5–15.5)
WBC: 26.8 10*3/uL — ABNORMAL HIGH (ref 4.0–10.5)
nRBC: 2.2 % — ABNORMAL HIGH (ref 0.0–0.2)

## 2018-07-23 LAB — LACTIC ACID, PLASMA: Lactic Acid, Venous: 0.8 mmol/L (ref 0.5–1.9)

## 2018-07-23 MED ORDER — SODIUM CHLORIDE 0.9 % IV BOLUS
1000.0000 mL | Freq: Once | INTRAVENOUS | Status: AC
  Administered 2018-07-23: 1000 mL via INTRAVENOUS

## 2018-07-23 MED ORDER — IOPAMIDOL (ISOVUE-300) INJECTION 61%
100.0000 mL | Freq: Once | INTRAVENOUS | Status: AC | PRN
  Administered 2018-07-23: 100 mL via INTRAVENOUS

## 2018-07-23 MED ORDER — IOPAMIDOL (ISOVUE-300) INJECTION 61%
INTRAVENOUS | Status: AC
  Filled 2018-07-23: qty 100

## 2018-07-23 MED ORDER — SODIUM CHLORIDE 0.9 % IV SOLN
1.0000 g | Freq: Once | INTRAVENOUS | Status: AC
  Administered 2018-07-23: 1 g via INTRAVENOUS
  Filled 2018-07-23: qty 10

## 2018-07-23 MED ORDER — SODIUM CHLORIDE (PF) 0.9 % IJ SOLN
INTRAMUSCULAR | Status: AC
  Filled 2018-07-23: qty 50

## 2018-07-23 MED ORDER — POTASSIUM CHLORIDE CRYS ER 20 MEQ PO TBCR
40.0000 meq | EXTENDED_RELEASE_TABLET | Freq: Once | ORAL | Status: AC
  Administered 2018-07-23: 40 meq via ORAL
  Filled 2018-07-23: qty 2

## 2018-07-23 MED ORDER — HYDROCODONE-ACETAMINOPHEN 5-325 MG PO TABS
1.0000 | ORAL_TABLET | Freq: Once | ORAL | Status: AC
  Administered 2018-07-23: 1 via ORAL
  Filled 2018-07-23: qty 1

## 2018-07-23 NOTE — ED Notes (Signed)
Rectal temp 99.6

## 2018-07-23 NOTE — ED Notes (Signed)
Bed: WA04 Expected date:  Expected time:  Means of arrival:  Comments: 78 yo back pain

## 2018-07-23 NOTE — ED Provider Notes (Signed)
[facility] DEPT Provider Note   CSN: [id number] Arrival date & time: 08/06/2018  1725    History   Chief Complaint Chief Complaint  Patient presents with  . Back Pain  . Leg Pain    HPI Juan [name] is a 78 y.o. male.     Juan [name] is a 78 y.o. male with a history of hypertension, CHF, CAD, AAA, CKD, and recurrent UTIs, who presents to the emergency department for evaluation of generalized aching low back pain.  He reports this is been present and worsening over the last few days and he reports today he has been feeling generally weak and unwell.  He reports chills at home but no measured fevers.  He denies associated abdominal pain, nausea or vomiting.  No abnormal bowel movements.  He denies focal weakness in any one limb, denies numbness or paresthesias.  No loss of control of his bowels or bladder.  Denies chest pain or shortness of breath.  Reports he has shortness of breath that is chronic at baseline and he intermittently uses 2 L of nasal cannula oxygen at home.  No productive cough.  He denies any injury or trauma to the back.  Has taken Tylenol without improvement has not tried anything else to treat his symptoms.  Reports in the past when he is felt this way he is been admitted to the hospital.  Patient is a poor historian.     Past Medical History:  Diagnosis Date  . Allergic rhinitis   . Anemia   . Ascending aortic aneurysm ([place]) 03/2014   4.3cm on CT scan  . CAD (coronary artery disease)    dx elsewheer in past, no documentation. Non-ischemic myoview 2007  . Chronic diastolic CHF (congestive heart failure), NYHA class 2 (HCC)    Normal EF w/ grade 1 dd by echo 12/2015  . Chronic respiratory failure with hypoxia/2L at home    "2L; all the time" (05/03/2017)  . CKD (chronic kidney disease), stage III ([place])   . Edema    R>L leg, u/s 5-12 neg for DVT  . Hemorrhoid   . History of hydronephrosis   . History of Splenic infarct   . History  of thrombocytosis   . Hypertension   . Iron deficiency anemia due to chronic blood loss 03/31/2017  . 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 Migraine    "once/wk at least" (07/11/2013)  . Moderate to severe pulmonary hypertension ([place])   . Myeloproliferative disease ([place])   . Pyelonephritis 04/29/2017  . Recurrent Pseudomonas urinary tract infection   . Shortness of breath   . Sinus congestion   . Sleep apnea, obstructive    at some point used CPAP, was d/c  years ago  . Type II diabetes mellitus ([place])   . UTI (urinary tract infection) 06/2017  . UTI (urinary tract infection) 07/2017    Patient Active Problem List   Diagnosis Date Noted  . Normocytic anemia 06/10/2018  . Thrombocytopenia ([place]) 06/10/2018  . Recurrent UTI 06/10/2018  . Venous insufficiency of lower extremity 06/10/2018  . Goals of care, counseling/discussion 03/17/2018  . ARF (acute renal failure) ([place]) 09/24/2017  . Staphylococcus epidermidis infection   . Acute encephalopathy 08/18/2017  . Sleep apnea, obstructive 08/18/2017  . 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 Moderate to severe pulmonary hypertension ([place]) 08/18/2017  . Chronic respiratory failure with hypoxia ([place]) 08/18/2017  . Chronic diastolic CHF (congestive heart failure), NYHA class 2 ([place]) 08/18/2017  . 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 HTN (hypertension) 08/18/2017  . Pyelonephritis 07/17/2017  . Hypokalemia   . Iron deficiency anemia due to chronic blood loss 03/31/2017  . 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 Urinary retention 01/24/2017  . 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 Elevated troponin 11/14/2016  . Recurrent pneumonia 10/31/2016  . Sprain of left rotator cuff capsule   . Hyperkalemia   . Paroxysmal SVT (supraventricular tachycardia) ([place]) 04/20/2016  . Type II diabetes mellitus ([place]) 04/20/2016  . Gout 04/20/2016  . B12 deficiency 04/20/2016  . 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 Hydronephrosis   . Thrombocytosis ([place]) 04/07/2016  . T wave inversion in EKG 12/29/2015  . PCP NOTES >>>>>>>>>>>>>>>>> 05/02/2015  . Peripheral edema   . Thoracic aortic aneurysm ([place]) 04/26/2014  . CKD (chronic kidney disease) stage 3, GFR 30-59 ml/min (HCC) 04/26/2014  . Generalized weakness 04/06/2014  . Venous stasis dermatitis of both lower extremities   . Morbid obesity due to excess calories ([place]) 03/29/2014  . 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 Leukocytosis 10/17/2013  . Poor compliance with advise  05/05/2012  . Annual physical exam 01/14/2012  . Weight loss 01/14/2012  . BPH (benign prostatic hyperplasia) 01/14/2012  . 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 Obstructive sleep apnea 09/07/2006  . Coronary atherosclerosis 09/07/2006  . Allergic rhinitis 09/07/2006    Past Surgical History:  Procedure Laterality Date  . FLEXIBLE SIGMOIDOSCOPY N/A 06/08/2017   Procedure: FLEXIBLE SIGMOIDOSCOPY;  Surgeon: Carol Ada, MD;  Location: [facility];  Service: Endoscopy;  Laterality: N/A;  . IR FLUORO GUIDE CV LINE RIGHT  05/07/2017  . IR FLUORO RM 30-60 MIN  05/07/2017  . IR US GUIDE VASC ACCESS RIGHT  05/07/2017  . TOE SURGERY Right    "tried to straighten out big toe" (07/11/2013)        Home Medications    Prior to Admission medications   Medication Sig Start Date End Date Taking? Authorizing Provider  acetaminophen (TYLENOL) 325 MG tablet Take 2 tablets (650 mg total) by mouth every 6 (six) hours as needed for mild pain (or Fever >/= 101). 05/08/17  Yes Rai, Ripudeep K, MD  aspirin EC 81 MG tablet Take 1 tablet (81 mg total) by mouth daily. 06/27/18  Yes [name], MD  b complex vitamins tablet Take 1 tablet by mouth daily.   Yes [provider]  clotrimazole (LOTRIMIN AF) 1 % cream Apply to Lt foot (between) Toes bid for athletes foot Patient taking differently: Apply 1 application topically 2 (two) times daily. Apply to  left foot (between) toes twice daily for athletes foot. 09/12/17  Yes Emokpae, Courage, MD  ferrous gluconate (FERGON) 240 (27 FE) MG tablet Take 240 mg by mouth daily.   Yes [provider]  furosemide (LASIX) 80 MG tablet Take 1 tablet (80 mg total) by mouth 2 (two) times daily. 06/27/18  Yes [name], MD  hydrALAZINE (APRESOLINE) 50 MG tablet Take 1.5 tablets (75 mg total) by mouth 3 (three) times daily. 02/09/18  Yes Bensimhon, Shaune Pascal, MD  hydrocortisone cream 1 % Apply 1 application topically 2 (two) times daily.   Yes [provider]  mouth rinse LIQD solution 15 mLs by Mouth Rinse route 2 (two) times daily. 09/26/17  Yes Shelly Coss, MD  benzonatate (TESSALON) 100 MG capsule Take 1 capsule (100 mg total) by mouth 3 (three) times daily as needed for cough. 03/17/18   [name]  E, MD  miconazole (MICOTIN) 2 % powder Apply 1 application topically as needed for itching.     [provider]  ondansetron (ZOFRAN) 4 MG tablet Take 1 tablet (4 mg total) by mouth every 6 (six) hours as needed for nausea or vomiting. Patient not taking: Reported on 07/25/2018 09/12/17   Roxan Hockey, MD  OXYGEN Inhale 2 L into the lungs continuous.    [provider]  tamsulosin (FLOMAX) 0.4 MG CAPS capsule Take 1 capsule (0.4 mg total) by mouth daily after supper. Patient taking differently: Take 0.4 mg by mouth daily as needed (urination).  06/13/17   Bonnielee Haff, MD    Family History Family History  Problem Relation Age of Onset  . Schizophrenia Son   . Mental illness Daughter   . Colon cancer Neg Hx   . Prostate cancer Neg Hx   . Heart attack Neg Hx   . Diabetes Neg Hx     Social History Social History   Tobacco Use  . Smoking status: Former Smoker    Packs/day: 0.25    Years: 12.00    Pack years: 3.00    Types: Cigarettes    Last attempt to quit: 05/18/1966    Years since quitting: 52.2  . Smokeless tobacco: Never Used  . Tobacco comment: quit  smoking 45 years ago  Substance Use Topics  . Alcohol use: No    Alcohol/week: 0.0 standard drinks  . Drug use: No     Allergies   Patient has no known allergies.   Review of Systems Review of Systems  Constitutional: Positive for chills. Negative for fever.  HENT: Negative.   Respiratory: Negative for cough and shortness of breath.   Cardiovascular: Negative for chest pain.  Gastrointestinal: Negative for abdominal pain, blood in stool, constipation, diarrhea, nausea and vomiting.  Genitourinary: Negative for dysuria, flank pain and frequency.  Musculoskeletal: Positive for back pain. Negative for arthralgias, myalgias, neck pain and neck stiffness.  Skin: Negative for color change and rash.  Neurological: Negative for weakness and numbness.  All other systems reviewed and are negative.    Physical Exam Updated Vital Signs BP (!) 147/70   Pulse 74   Temp 98.2 F (36.8 C) (Oral)   Resp 18   SpO2 95%   Physical Exam Vitals signs and nursing note reviewed.  Constitutional:      General: He is not in acute distress.    Appearance: He is well-developed and normal weight. He is not diaphoretic.     Comments: Chronically ill-appearing but in no acute distress  HENT:     Head: Normocephalic and atraumatic.     Mouth/Throat:     Mouth: Mucous membranes are moist.     Pharynx: Oropharynx is clear.  Eyes:     General:        Right eye: No discharge.        Left eye: No discharge.     Pupils: Pupils are equal, round, and reactive to light.  Neck:     Musculoskeletal: Neck supple.  Cardiovascular:     Rate and Rhythm: Normal rate and regular rhythm.     Pulses: Normal pulses.     Heart sounds: Normal heart sounds. No murmur. No friction rub. No gallop.   Pulmonary:     Effort: Pulmonary effort is normal. No respiratory distress.     Breath sounds: Normal breath sounds. No wheezing or rales.     Comments: Respirations equal and unlabored, patient  able to speak in full  sentences, lungs clear to auscultation bilaterally Abdominal:     General: Bowel sounds are normal. There is no distension.     Palpations: Abdomen is soft. There is no mass.     Tenderness: There is no abdominal tenderness. There is no guarding.     Comments: Abdomen soft, nondistended, nontender to palpation in all quadrants without guarding or peritoneal signs  Musculoskeletal:        General: No deformity.     Comments: Mild generalized tenderness across the low back, no bony point tenderness, no overlying skin changes. Bilateral lower extremities with 1+ edema but skin is crenulated and strong and suggestive of resolution of prior edema in the past.  Bilateral DP and PT pulses are 2+.  Skin:    General: Skin is warm and dry.     Capillary Refill: Capillary refill takes less than 2 seconds.  Neurological:     Mental Status: He is alert.     Coordination: Coordination normal.     Comments: Speech is clear, able to follow commands CN III-XII intact Normal strength in upper and lower extremities bilaterally including dorsiflexion and plantar flexion, strong and equal grip strength Sensation normal to light and sharp touch Moves extremities without ataxia, coordination intact   Psychiatric:        Mood and Affect: Mood normal.        Behavior: Behavior normal.      ED Treatments / Results  Labs (all labs ordered are listed, but only abnormal results are displayed) Labs Reviewed  COMPREHENSIVE METABOLIC PANEL - Abnormal; Notable for the following components:      Result Value   Potassium 3.0 (*)    Glucose, Bld 110 (*)    BUN 70 (*)    Creatinine, Ser 1.41 (*)    Albumin 3.4 (*)    GFR calc non Af Amer 48 (*)    GFR calc Af Amer 56 (*)    All other components within normal limits  CBC WITH DIFFERENTIAL/PLATELET - Abnormal; Notable for the following components:   WBC 26.8 (*)    RBC 4.10 (*)    Hemoglobin 10.3 (*)    HCT 35.5 (*)    MCH 25.1 (*)    MCHC 29.0 (*)    RDW  18.4 (*)    nRBC 2.2 (*)    Neutro Abs 11.9 (*)    Monocytes Absolute 5.0 (*)    Basophils Absolute 1.1 (*)    Abs Immature Granulocytes 4.87 (*)    All other components within normal limits  URINALYSIS, ROUTINE W REFLEX MICROSCOPIC - Abnormal; Notable for the following components:   APPearance HAZY (*)    Protein, ur 100 (*)    Nitrite POSITIVE (*)    Leukocytes,Ua LARGE (*)    WBC, UA >50 (*)    Bacteria, UA MANY (*)    All other components within normal limits  URINE CULTURE  CULTURE, BLOOD (ROUTINE X 2)  CULTURE, BLOOD (ROUTINE X 2)  LACTIC ACID, PLASMA  BRAIN NATRIURETIC PEPTIDE  MAGNESIUM  BASIC METABOLIC PANEL  CBC    EKG EKG Interpretation  Date/Time:  Saturday July 23 2018 19:35:03 EST Ventricular Rate:  71 PR Interval:    QRS Duration: 101 QT Interval:  433 QTC Calculation: 471 R Axis:   -32 Text Interpretation:  Sinus rhythm Borderline prolonged PR interval Left ventricular hypertrophy Anterior Q waves, possibly due to LVH Nonspecific T abnormalities, lateral leads no sig  change Confirmed by Charlesetta Shanks [phone]) on 08/11/2018 10:43:20 PM   Radiology Dg Chest 2 View  Result Date: 08/06/2018 CLINICAL DATA:  Back pain, leg pain EXAM: CHEST - 2 VIEW COMPARISON:  03/24/2018 FINDINGS: Low volume examination with mild diffuse interstitial pulmonary opacity, similar to prior examination and possibly reflecting mild edema. There is no new or focal airspace opacity. Cardiomegaly. 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 IMPRESSION: 1. Low volume examination with possible mild interstitial edema. No new or focal airspace opacity. 2.  Cardiomegaly. 3. 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 Electronically Signed   By: Eddie Candle M.D.   On: 08/12/2018 20:24   Dg Lumbar Spine Complete  Result Date: 07/17/2018 CLINICAL DATA:  Back pain and  left leg pain. No known injury. EXAM: LUMBAR SPINE - COMPLETE 4+ VIEW COMPARISON:  Radiograph 09/06/2017, CT 09/05/2017 FINDINGS: Lateral view limited by positioning. There 4 non-rib-bearing lumbar vertebra. Chronic diffuse bony sclerosis with punctate lucencies, unchanged from prior exam. The alignment is grossly maintained. Vertebral body heights are normal. There is no listhesis. The posterior elements are intact. Disc spaces are preserved. No fracture. Sacroiliac joints are congruent. IMPRESSION: 1. No acute abnormality. 2. Chronic and unchanged diffuse bony sclerosis with punctate lucencies from multiple prior exams, likely related to patient's history of myelodysplasia. Electronically Signed   By: Keith Rake M.D.   On: 08/14/2018 20:31   Ct Abdomen Pelvis W Contrast  Result Date: 07/26/2018 CLINICAL DATA:  Acute abdominal pain. Fever. EXAM: CT ABDOMEN AND PELVIS WITH CONTRAST TECHNIQUE: Multidetector CT imaging of the abdomen and pelvis was performed using the standard protocol following bolus administration of intravenous contrast. CONTRAST:  122mL ISOVUE-300 IOPAMIDOL (ISOVUE-300) INJECTION 61% COMPARISON:  CT 09/05/2017, multiple priors FINDINGS: Lower chest: Small pleural effusions. Associated bibasilar atelectasis. Mild cardiomegaly. Hepatobiliary: Prominent liver spanning 22.8 cm cranial caudal. Distended gallbladder with possible punctate gallstone. No biliary dilatation. Pancreas: No ductal dilatation or inflammation. Spleen: Chronic splenomegaly. Adrenals/Urinary Tract: No adrenal nodule. Bilateral hydroureteronephrosis. Ureter is dilated to the bladder insertion. Bilateral perinephric edema. Only minimal excretion on delayed phase imaging. Simple cyst from the lower right kidney. No urolithiasis. Bladder physiologically distended with multiple bladder diverticula and bladder wall thickening. There is perivesicular edema. Stomach/Bowel: Stomach is within normal limits. Appendix appears  normal. No evidence of bowel wall thickening, distention, or inflammatory changes. Colonic diverticulosis without diverticulitis. Surgical clip in the sigmoid colon. Vascular/Lymphatic: Aortic atherosclerosis without aneurysm. No enlarged lymph nodes in the abdomen or pelvis. Reproductive: Prominent prostate gland spans 4.9 cm. Other: No free air or ascites. Small fat containing umbilical hernia. Small skin/subcutaneous lesion in the left groin measures 2.4 x 1.5 cm, previously 1.7 x 1.0 cm. Musculoskeletal: Abnormal marrow with diffuse sclerosis in innumerable lytic lesions, unchanged from priors. Degenerative disc disease at L4-L5 appears similar to prior. IMPRESSION: 1. Bilateral hydroureteronephrosis, the ureters are dilated to the bladder insertion. Bladder wall thickening with multiple bladder diverticula and perivesicular edema. Findings consistent with chronic bladder outlet obstruction with cystitis and urinary tract infection. 2. Distended gallbladder with possible gallstone. If there is clinical concern for hepatic biliary pathology, recommend right upper quadrant ultrasound. 3. Right groin skin/subcutaneous nodule has slightly increased in size from prior but has been present for multiple years. This should be amenable to direct inspection with physical exam. 4. Chronic findings include colonic diverticulosis and hepatosplenomegaly. 5. Chronic abnormal appearance of the osseous structures with sclerosis and innumerable lytic lesions, likely  due to myelofibrosis. Electronically Signed   By: Keith Rake M.D.   On: 07/26/2018 23:41    Procedures Procedures (including critical care time)  Medications Ordered in ED Medications  sodium chloride 0.9 % bolus 1,000 mL (has no administration in time range)  HYDROcodone-acetaminophen (NORCO/VICODIN) 5-325 MG per tablet 1 tablet (has no administration in time range)     Initial Impression / Assessment and Plan / ED Course  I have reviewed the  triage vital signs and the nursing notes.  Pertinent labs & imaging results that were available during my care of the patient were reviewed by me and considered in my medical decision making (see chart for details).  78 year old male presents reporting generalized low back pain and feeling generally weak and unwell.  On arrival patient with low-grade fever, vitals otherwise stable.  Patient feels warm to the touch.  Lungs are clear, heart RRR, no abdominal tenderness, generalized tenderness across the low back without focal bony tenderness.  Given patient's generalized weakness, without focal neurologic deficit will get rectal temp, lactic acid, blood cultures, basic labs, urinalysis and culture, chest x-ray and x-ray of the low back.  Will give IV fluids and pain medication.  Lab work shows mild hypokalemia of 3.0, creatinine is at baseline, no other acute electrolyte derangements and normal liver function.  Urinalysis consistent with infection, positive for nitrites and leukocytes with greater than 50 WBCs and many bacteria as well as white blood cell clumps.  Lactic acid is not elevated.  CBC shows significant leukocytosis of 26.8 with left shift, stable hemoglobin.  Chest x-ray and lumbar x-ray is unremarkable.  Given leukocytosis and UTI will get CT abdomen pelvis to rule out obstructive uropathy, and assess for signs of pyelonephritis.  Patient will be given IV Rocephin for UTI.  CT shows bilateral hydronephrosis the ureters are dilated to the bladder insertion, there is bladder wall thickening with multiple bladder diverticuli noted, this is likely the cause for patient's recurrent UTIs.  There is a right groin skin nodule which has increased in size from evaluation many years ago.  Chronic diverticulosis without signs of diverticulitis and numerous lytic rib lesions consistent with patient's history of myeloproliferative disease.  Will consult hospitalist for admission for UTI with bilateral  hydronephrosis.  Case discussed with Dr. Blaine Hamper who will see and admit the patient, requests placement of Foley catheter but at this point patient refuses Foley catheter.  Patient discussed with Dr. Johnney Killian, who saw patient as well and agrees with plan.   Final Clinical Impressions(s) / ED Diagnoses   Final diagnoses:  Complicated UTI (urinary tract infection)  Bilateral hydronephrosis    ED Discharge Orders    None       Jacqlyn Larsen, [place] 07/24/18 0329    Charlesetta Shanks, MD 07/24/18 2055

## 2018-07-23 NOTE — ED Triage Notes (Signed)
Pt BIBA from home c/o back pain, left leg pain since 0300 today.  Pt reports taking tylenol today, no relief.  Denies falling, denies any recent injury.

## 2018-07-23 NOTE — ED Notes (Signed)
Pt. Still at xray

## 2018-07-23 NOTE — ED Notes (Signed)
Patient transported to X-ray 

## 2018-07-23 NOTE — ED Provider Notes (Signed)
Medical screening examination/treatment/procedure(s) were conducted as a shared visit with non-physician practitioner(s) and myself.  I personally evaluated the patient during the encounter.  None Patient reports he is having generalized aching back pain.  Generally lower.  He has difficulty localizing.  He reports he feels generally weak and sick.  No significant abdominal pain.  Patient reports at baseline he does get up and ambulate about.  He does not specifically have numbness or paresthesia to the legs.  Patient is ill and debilitated in appearance.  Heart is regular.  Lungs are grossly clear.  Abdomen soft without guarding.  Back no localizing bony point tenderness.  Lower extremities 1+ edema but skin crenulated and shrunken suggestive of resolution of larger edema in the past.  Dorsalis pedis pulses are 2+.  I agree with plan of management.   Charlesetta Shanks, MD 07/25/2018 2219

## 2018-07-24 ENCOUNTER — Other Ambulatory Visit: Payer: Self-pay

## 2018-07-24 ENCOUNTER — Encounter (HOSPITAL_COMMUNITY): Payer: Self-pay | Admitting: *Deleted

## 2018-07-24 DIAGNOSIS — D7581 Myelofibrosis: Secondary | ICD-10-CM | POA: Diagnosis not present

## 2018-07-24 DIAGNOSIS — J9601 Acute respiratory failure with hypoxia: Secondary | ICD-10-CM | POA: Diagnosis not present

## 2018-07-24 DIAGNOSIS — N179 Acute kidney failure, unspecified: Secondary | ICD-10-CM | POA: Diagnosis not present

## 2018-07-24 DIAGNOSIS — Z9911 Dependence on respirator [ventilator] status: Secondary | ICD-10-CM | POA: Diagnosis not present

## 2018-07-24 DIAGNOSIS — R9082 White matter disease, unspecified: Secondary | ICD-10-CM | POA: Diagnosis not present

## 2018-07-24 DIAGNOSIS — I272 Pulmonary hypertension, unspecified: Secondary | ICD-10-CM

## 2018-07-24 DIAGNOSIS — R4020 Unspecified coma: Secondary | ICD-10-CM | POA: Diagnosis not present

## 2018-07-24 DIAGNOSIS — Z515 Encounter for palliative care: Secondary | ICD-10-CM | POA: Diagnosis not present

## 2018-07-24 DIAGNOSIS — I2510 Atherosclerotic heart disease of native coronary artery without angina pectoris: Secondary | ICD-10-CM | POA: Diagnosis present

## 2018-07-24 DIAGNOSIS — E870 Hyperosmolality and hypernatremia: Secondary | ICD-10-CM | POA: Diagnosis not present

## 2018-07-24 DIAGNOSIS — I480 Paroxysmal atrial fibrillation: Secondary | ICD-10-CM | POA: Diagnosis not present

## 2018-07-24 DIAGNOSIS — I471 Supraventricular tachycardia: Secondary | ICD-10-CM | POA: Diagnosis not present

## 2018-07-24 DIAGNOSIS — R000 Tachycardia, unspecified: Secondary | ICD-10-CM | POA: Diagnosis not present

## 2018-07-24 DIAGNOSIS — R0602 Shortness of breath: Secondary | ICD-10-CM | POA: Diagnosis not present

## 2018-07-24 DIAGNOSIS — N183 Chronic kidney disease, stage 3 (moderate): Secondary | ICD-10-CM

## 2018-07-24 DIAGNOSIS — Z4682 Encounter for fitting and adjustment of non-vascular catheter: Secondary | ICD-10-CM | POA: Diagnosis not present

## 2018-07-24 DIAGNOSIS — Z1623 Resistance to quinolones and fluoroquinolones: Secondary | ICD-10-CM | POA: Diagnosis present

## 2018-07-24 DIAGNOSIS — N1330 Unspecified hydronephrosis: Secondary | ICD-10-CM | POA: Diagnosis not present

## 2018-07-24 DIAGNOSIS — R4182 Altered mental status, unspecified: Secondary | ICD-10-CM | POA: Diagnosis not present

## 2018-07-24 DIAGNOSIS — I5032 Chronic diastolic (congestive) heart failure: Secondary | ICD-10-CM

## 2018-07-24 DIAGNOSIS — R918 Other nonspecific abnormal finding of lung field: Secondary | ICD-10-CM | POA: Diagnosis not present

## 2018-07-24 DIAGNOSIS — A4152 Sepsis due to Pseudomonas: Secondary | ICD-10-CM | POA: Diagnosis not present

## 2018-07-24 DIAGNOSIS — N170 Acute kidney failure with tubular necrosis: Secondary | ICD-10-CM | POA: Diagnosis present

## 2018-07-24 DIAGNOSIS — Z1624 Resistance to multiple antibiotics: Secondary | ICD-10-CM | POA: Diagnosis present

## 2018-07-24 DIAGNOSIS — A419 Sepsis, unspecified organism: Secondary | ICD-10-CM | POA: Diagnosis not present

## 2018-07-24 DIAGNOSIS — R579 Shock, unspecified: Secondary | ICD-10-CM | POA: Diagnosis not present

## 2018-07-24 DIAGNOSIS — E872 Acidosis: Secondary | ICD-10-CM | POA: Diagnosis not present

## 2018-07-24 DIAGNOSIS — J9811 Atelectasis: Secondary | ICD-10-CM | POA: Diagnosis not present

## 2018-07-24 DIAGNOSIS — C946 Myelodysplastic disease, not classified: Secondary | ICD-10-CM | POA: Diagnosis present

## 2018-07-24 DIAGNOSIS — G40901 Epilepsy, unspecified, not intractable, with status epilepticus: Secondary | ICD-10-CM | POA: Diagnosis not present

## 2018-07-24 DIAGNOSIS — D469 Myelodysplastic syndrome, unspecified: Secondary | ICD-10-CM | POA: Diagnosis not present

## 2018-07-24 DIAGNOSIS — N390 Urinary tract infection, site not specified: Secondary | ICD-10-CM | POA: Diagnosis not present

## 2018-07-24 DIAGNOSIS — G92 Toxic encephalopathy: Secondary | ICD-10-CM | POA: Diagnosis not present

## 2018-07-24 DIAGNOSIS — M5127 Other intervertebral disc displacement, lumbosacral region: Secondary | ICD-10-CM | POA: Diagnosis not present

## 2018-07-24 DIAGNOSIS — Z452 Encounter for adjustment and management of vascular access device: Secondary | ICD-10-CM | POA: Diagnosis not present

## 2018-07-24 DIAGNOSIS — J9611 Chronic respiratory failure with hypoxia: Secondary | ICD-10-CM

## 2018-07-24 DIAGNOSIS — D72829 Elevated white blood cell count, unspecified: Secondary | ICD-10-CM | POA: Diagnosis not present

## 2018-07-24 DIAGNOSIS — R680 Hypothermia, not associated with low environmental temperature: Secondary | ICD-10-CM | POA: Diagnosis not present

## 2018-07-24 DIAGNOSIS — K8020 Calculus of gallbladder without cholecystitis without obstruction: Secondary | ICD-10-CM | POA: Diagnosis not present

## 2018-07-24 DIAGNOSIS — I517 Cardiomegaly: Secondary | ICD-10-CM | POA: Diagnosis not present

## 2018-07-24 DIAGNOSIS — J690 Pneumonitis due to inhalation of food and vomit: Secondary | ICD-10-CM | POA: Diagnosis not present

## 2018-07-24 DIAGNOSIS — I130 Hypertensive heart and chronic kidney disease with heart failure and stage 1 through stage 4 chronic kidney disease, or unspecified chronic kidney disease: Secondary | ICD-10-CM | POA: Diagnosis present

## 2018-07-24 DIAGNOSIS — M549 Dorsalgia, unspecified: Secondary | ICD-10-CM | POA: Diagnosis present

## 2018-07-24 DIAGNOSIS — N136 Pyonephrosis: Secondary | ICD-10-CM | POA: Diagnosis present

## 2018-07-24 DIAGNOSIS — J810 Acute pulmonary edema: Secondary | ICD-10-CM | POA: Diagnosis not present

## 2018-07-24 DIAGNOSIS — Y95 Nosocomial condition: Secondary | ICD-10-CM | POA: Diagnosis not present

## 2018-07-24 DIAGNOSIS — N184 Chronic kidney disease, stage 4 (severe): Secondary | ICD-10-CM | POA: Diagnosis present

## 2018-07-24 DIAGNOSIS — R0902 Hypoxemia: Secondary | ICD-10-CM | POA: Diagnosis not present

## 2018-07-24 DIAGNOSIS — Z6829 Body mass index (BMI) 29.0-29.9, adult: Secondary | ICD-10-CM | POA: Diagnosis not present

## 2018-07-24 DIAGNOSIS — M545 Low back pain: Secondary | ICD-10-CM | POA: Diagnosis not present

## 2018-07-24 DIAGNOSIS — E874 Mixed disorder of acid-base balance: Secondary | ICD-10-CM | POA: Diagnosis not present

## 2018-07-24 DIAGNOSIS — J9600 Acute respiratory failure, unspecified whether with hypoxia or hypercapnia: Secondary | ICD-10-CM | POA: Diagnosis not present

## 2018-07-24 DIAGNOSIS — G9340 Encephalopathy, unspecified: Secondary | ICD-10-CM | POA: Diagnosis not present

## 2018-07-24 DIAGNOSIS — G834 Cauda equina syndrome: Secondary | ICD-10-CM | POA: Diagnosis present

## 2018-07-24 DIAGNOSIS — Z960 Presence of urogenital implants: Secondary | ICD-10-CM | POA: Diagnosis not present

## 2018-07-24 DIAGNOSIS — D62 Acute posthemorrhagic anemia: Secondary | ICD-10-CM | POA: Diagnosis not present

## 2018-07-24 DIAGNOSIS — E876 Hypokalemia: Secondary | ICD-10-CM | POA: Diagnosis present

## 2018-07-24 DIAGNOSIS — Z881 Allergy status to other antibiotic agents status: Secondary | ICD-10-CM | POA: Diagnosis not present

## 2018-07-24 DIAGNOSIS — N3289 Other specified disorders of bladder: Secondary | ICD-10-CM | POA: Diagnosis not present

## 2018-07-24 DIAGNOSIS — D649 Anemia, unspecified: Secondary | ICD-10-CM | POA: Diagnosis not present

## 2018-07-24 DIAGNOSIS — R569 Unspecified convulsions: Secondary | ICD-10-CM | POA: Diagnosis not present

## 2018-07-24 DIAGNOSIS — B965 Pseudomonas (aeruginosa) (mallei) (pseudomallei) as the cause of diseases classified elsewhere: Secondary | ICD-10-CM | POA: Diagnosis not present

## 2018-07-24 DIAGNOSIS — J9621 Acute and chronic respiratory failure with hypoxia: Secondary | ICD-10-CM | POA: Diagnosis not present

## 2018-07-24 DIAGNOSIS — R531 Weakness: Secondary | ICD-10-CM | POA: Diagnosis not present

## 2018-07-24 DIAGNOSIS — R40243 Glasgow coma scale score 3-8, unspecified time: Secondary | ICD-10-CM | POA: Diagnosis not present

## 2018-07-24 DIAGNOSIS — R6520 Severe sepsis without septic shock: Secondary | ICD-10-CM | POA: Diagnosis not present

## 2018-07-24 LAB — BASIC METABOLIC PANEL
Anion gap: 9 (ref 5–15)
BUN: 64 mg/dL — ABNORMAL HIGH (ref 8–23)
CALCIUM: 9.5 mg/dL (ref 8.9–10.3)
CO2: 23 mmol/L (ref 22–32)
Chloride: 109 mmol/L (ref 98–111)
Creatinine, Ser: 1.35 mg/dL — ABNORMAL HIGH (ref 0.61–1.24)
GFR calc non Af Amer: 51 mL/min — ABNORMAL LOW (ref >60)
GFR, EST AFRICAN AMERICAN: 59 mL/min — AB (ref >60)
Glucose, Bld: 111 mg/dL — ABNORMAL HIGH (ref 70–99)
Potassium: 3.5 mmol/L (ref 3.5–5.1)
Sodium: 141 mmol/L (ref 135–145)

## 2018-07-24 LAB — CBC
HCT: 33.5 % — ABNORMAL LOW (ref 39.0–52.0)
Hemoglobin: 9.4 g/dL — ABNORMAL LOW (ref 13.0–17.0)
MCH: 24.7 pg — ABNORMAL LOW (ref 26.0–34.0)
MCHC: 28.1 g/dL — ABNORMAL LOW (ref 30.0–36.0)
MCV: 87.9 fL (ref 80.0–100.0)
Platelets: 146 10*3/uL — ABNORMAL LOW (ref 150–400)
RBC: 3.81 MIL/uL — ABNORMAL LOW (ref 4.22–5.81)
RDW: 18.3 % — ABNORMAL HIGH (ref 11.5–15.5)
WBC: 21.1 10*3/uL — ABNORMAL HIGH (ref 4.0–10.5)
nRBC: 2.2 % — ABNORMAL HIGH (ref 0.0–0.2)

## 2018-07-24 LAB — MAGNESIUM: Magnesium: 2.4 mg/dL (ref 1.7–2.4)

## 2018-07-24 LAB — BRAIN NATRIURETIC PEPTIDE: B Natriuretic Peptide: 67.9 pg/mL (ref 0.0–100.0)

## 2018-07-24 LAB — MRSA PCR SCREENING: MRSA by PCR: NEGATIVE

## 2018-07-24 MED ORDER — HYDROCORTISONE 1 % EX CREA
1.0000 "application " | TOPICAL_CREAM | Freq: Two times a day (BID) | CUTANEOUS | Status: DC
  Administered 2018-07-24 – 2018-08-17 (×47): 1 via TOPICAL
  Filled 2018-07-24 (×2): qty 28

## 2018-07-24 MED ORDER — FERROUS GLUCONATE 324 (38 FE) MG PO TABS
324.0000 mg | ORAL_TABLET | Freq: Every day | ORAL | Status: DC
  Administered 2018-07-24 – 2018-07-26 (×3): 324 mg via ORAL
  Filled 2018-07-24 (×4): qty 1

## 2018-07-24 MED ORDER — ACETAMINOPHEN 325 MG PO TABS
650.0000 mg | ORAL_TABLET | Freq: Four times a day (QID) | ORAL | Status: DC | PRN
  Administered 2018-07-24: 650 mg via ORAL
  Filled 2018-07-24 (×2): qty 2

## 2018-07-24 MED ORDER — CLOTRIMAZOLE 1 % EX CREA
1.0000 "application " | TOPICAL_CREAM | Freq: Two times a day (BID) | CUTANEOUS | Status: DC
  Administered 2018-07-24 – 2018-08-20 (×54): 1 via TOPICAL
  Filled 2018-07-24 (×3): qty 15

## 2018-07-24 MED ORDER — DICLOFENAC SODIUM 1 % TD GEL
4.0000 g | Freq: Three times a day (TID) | TRANSDERMAL | Status: DC | PRN
  Administered 2018-07-24 – 2018-07-27 (×6): 4 g via TOPICAL
  Filled 2018-07-24: qty 100

## 2018-07-24 MED ORDER — HEPARIN SODIUM (PORCINE) 5000 UNIT/ML IJ SOLN
5000.0000 [IU] | Freq: Three times a day (TID) | INTRAMUSCULAR | Status: DC
  Administered 2018-07-24 – 2018-08-12 (×53): 5000 [IU] via SUBCUTANEOUS
  Filled 2018-07-24 (×54): qty 1

## 2018-07-24 MED ORDER — HYDROCODONE-ACETAMINOPHEN 5-325 MG PO TABS
1.0000 | ORAL_TABLET | Freq: Four times a day (QID) | ORAL | Status: DC | PRN
  Administered 2018-07-25: 1 via ORAL
  Filled 2018-07-24: qty 1

## 2018-07-24 MED ORDER — POTASSIUM CHLORIDE CRYS ER 20 MEQ PO TBCR: 20.0000 meq | EXTENDED_RELEASE_TABLET | Freq: Once | ORAL | Status: DC

## 2018-07-24 MED ORDER — SODIUM CHLORIDE 0.9 % IV SOLN
2.0000 g | Freq: Two times a day (BID) | INTRAVENOUS | Status: DC
  Administered 2018-07-24 – 2018-07-28 (×10): 2 g via INTRAVENOUS
  Filled 2018-07-24 (×11): qty 2

## 2018-07-24 MED ORDER — FUROSEMIDE 40 MG PO TABS
80.0000 mg | ORAL_TABLET | Freq: Two times a day (BID) | ORAL | Status: DC
  Administered 2018-07-24 – 2018-07-25 (×4): 80 mg via ORAL
  Filled 2018-07-24 (×4): qty 2

## 2018-07-24 MED ORDER — HYDRALAZINE HCL 20 MG/ML IJ SOLN: 5.0000 mg | INTRAMUSCULAR | Status: DC | PRN

## 2018-07-24 MED ORDER — ASPIRIN EC 81 MG PO TBEC
81.0000 mg | DELAYED_RELEASE_TABLET | Freq: Every day | ORAL | Status: DC
  Administered 2018-07-24 – 2018-07-26 (×3): 81 mg via ORAL
  Filled 2018-07-24 (×4): qty 1

## 2018-07-24 MED ORDER — SENNOSIDES-DOCUSATE SODIUM 8.6-50 MG PO TABS
1.0000 | ORAL_TABLET | Freq: Every evening | ORAL | Status: DC | PRN
  Filled 2018-07-24: qty 1

## 2018-07-24 MED ORDER — ONDANSETRON HCL 4 MG/2ML IJ SOLN
4.0000 mg | Freq: Four times a day (QID) | INTRAMUSCULAR | Status: DC | PRN
  Administered 2018-07-24: 4 mg via INTRAVENOUS
  Filled 2018-07-24: qty 2

## 2018-07-24 MED ORDER — SODIUM CHLORIDE 0.9 % IV SOLN
INTRAVENOUS | Status: DC | PRN
  Administered 2018-07-24: 250 mL via INTRAVENOUS

## 2018-07-24 MED ORDER — B COMPLEX-C PO TABS
1.0000 | ORAL_TABLET | Freq: Every day | ORAL | Status: DC
  Administered 2018-07-24 – 2018-07-26 (×3): 1 via ORAL
  Filled 2018-07-24 (×4): qty 1

## 2018-07-24 MED ORDER — ALBUTEROL SULFATE (2.5 MG/3ML) 0.083% IN NEBU
2.5000 mg | INHALATION_SOLUTION | Freq: Four times a day (QID) | RESPIRATORY_TRACT | Status: DC | PRN
  Administered 2018-07-27: 2.5 mg via RESPIRATORY_TRACT

## 2018-07-24 MED ORDER — ACETAMINOPHEN 650 MG RE SUPP: 650.0000 mg | Freq: Four times a day (QID) | RECTAL | Status: DC | PRN

## 2018-07-24 MED ORDER — HYDRALAZINE HCL 50 MG PO TABS
75.0000 mg | ORAL_TABLET | Freq: Three times a day (TID) | ORAL | Status: DC
  Administered 2018-07-24 (×3): 75 mg via ORAL
  Filled 2018-07-24 (×3): qty 1

## 2018-07-24 MED ORDER — ONDANSETRON HCL 4 MG PO TABS: 4.0000 mg | ORAL_TABLET | Freq: Four times a day (QID) | ORAL | Status: DC | PRN

## 2018-07-24 MED ORDER — TAMSULOSIN HCL 0.4 MG PO CAPS
0.4000 mg | ORAL_CAPSULE | Freq: Every day | ORAL | Status: DC
  Administered 2018-07-24 – 2018-07-26 (×3): 0.4 mg via ORAL
  Filled 2018-07-24 (×4): qty 1

## 2018-07-24 MED ORDER — ORAL CARE MOUTH RINSE
15.0000 mL | Freq: Two times a day (BID) | OROMUCOSAL | Status: DC
  Administered 2018-07-25 – 2018-07-30 (×5): 15 mL via OROMUCOSAL

## 2018-07-24 NOTE — H&P (Signed)
History and Physical    Juan [name] DGU:[id number] DOB: 10/05/1940 DOA: 07/26/2018  Referring MD/NP/PA:   PCP: Colon Branch, MD   Patient coming from:  The patient is coming from home.  At baseline, pt is independent for most of ADL.        Chief Complaint: Dysuria, back pain  HPI: Juan [name] is a 78 y.o. 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  Patient states that he has been having back pain in the past 3 days, which involves whole back, worse in the lower back, constant, 10 out of 10 severity, sharp, nonradiating.  No leg weakness or numbness.  He also states that he has dysuria, burning on urination and increased urinary frequency.  No hematuria.  He has chills, no fever.  Patient has chronic shortness of breath using 2 L oxygen at home, which is at baseline.  He has some mild dry cough, but no chest pain.  No unilateral weakness.  ED Course: pt was found to have WBC 26.8 (22.8 on 06/10/2018), positive urinalysis (hazy appearance, large amount of leukocyte, many bacteria, WBC> 50), normal LFT, stable renal function, potassium 3.0, lactic acid of 0.8, temperature 99.6, no tachycardia, oxygen saturation 97% on 2 L oxygen, chest x-ray showed low volume, cardiomegaly and mild interstitial edema.  Patient is admitted to telemetry bed as inpatient.  CT-abdomen/pelvis showed:  1. Bilateral hydroureteronephrosis, the ureters are dilated to the bladder insertion. Bladder wall thickening with multiple bladder diverticula and perivesicular edema. Findings consistent with chronic bladder outlet obstruction with cystitis and urinary tract infection. 2. Distended gallbladder with possible gallstone. If there is  clinical concern for hepatic biliary pathology, recommend right upper quadrant ultrasound. 3. Right groin skin/subcutaneous nodule has slightly increased in size from prior but has been present for multiple years. This should be amenable to direct inspection with physical exam. 4. Chronic findings include colonic diverticulosis and hepatosplenomegaly. 5. 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  X-ray of L-spin 1. No acute abnormality. 2. Chronic and unchanged diffuse bony sclerosis with punctate lucencies from multiple prior exams, likely related to patient's history of myelodysplasia.   Review of Systems:   General: no fevers, has chills, no body weight gain, has poor appetite, has fatigue HEENT: no blurry vision, hearing changes or sore throat Respiratory: has dyspnea, coughing, no wheezing CV: no chest pain, no palpitations GI: no nausea, vomiting, abdominal pain, diarrhea, constipation GU: has dysuria, burning on urination, increased urinary frequency, no hematuria  Ext: mild leg edema Neuro: no unilateral weakness, numbness, or tingling, no vision change or hearing loss Skin: no rash, no skin tear. MSK: has back pain Heme: No easy bruising.  Travel history: No recent long distant travel.  Allergy: No Known Allergies  Past Medical History:  Diagnosis Date  . Allergic rhinitis   . Anemia   . Ascending aortic aneurysm ([place]) 03/2014   4.3cm on CT scan  . CAD (coronary artery disease)    dx elsewheer in past, no documentation. Non-ischemic myoview 2007  . Chronic diastolic CHF (congestive heart failure), NYHA class 2 (HCC)    Normal EF w/ grade 1 dd by echo 12/2015  . Chronic respiratory failure with hypoxia/2L at  home    "2L; all the time" (05/03/2017)  . CKD (chronic kidney disease), stage III ([place])   . Edema    R>L leg, u/s 5-12 neg for DVT  . Hemorrhoid   . History of hydronephrosis    . History of Splenic infarct   . History of thrombocytosis   . Hypertension   . Iron deficiency anemia due to chronic blood loss 03/31/2017  . 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 Migraine    "once/wk at least" (07/11/2013)  . Moderate to severe pulmonary hypertension ([place])   . Myeloproliferative disease ([place])   . Pyelonephritis 04/29/2017  . Recurrent Pseudomonas urinary tract infection   . Shortness of breath   . Sinus congestion   . Sleep apnea, obstructive    at some point used CPAP, was d/c  years ago  . Type II diabetes mellitus ([place])   . UTI (urinary tract infection) 06/2017  . UTI (urinary tract infection) 07/2017    Past Surgical History:  Procedure Laterality Date  . FLEXIBLE SIGMOIDOSCOPY N/A 06/08/2017   Procedure: FLEXIBLE SIGMOIDOSCOPY;  Surgeon: Carol Ada, MD;  Location: [facility];  Service: Endoscopy;  Laterality: N/A;  . IR FLUORO GUIDE CV LINE RIGHT  05/07/2017  . IR FLUORO RM 30-60 MIN  05/07/2017  . IR US GUIDE VASC ACCESS RIGHT  05/07/2017  . TOE SURGERY Right    "tried to straighten out big toe" (07/11/2013)    Social History:  reports that he quit smoking about 52 years ago. His smoking use included cigarettes. He has a 3.00 pack-year smoking history. He has never used smokeless tobacco. He reports that he does not drink alcohol or use drugs.  Family History:  Family History  Problem Relation Age of Onset  . Schizophrenia Son   . Mental illness Daughter   . Colon cancer Neg Hx   . Prostate cancer Neg Hx   . Heart attack Neg Hx   . Diabetes Neg Hx      Prior to Admission medications   Medication Sig Start Date End Date Taking? Authorizing Provider  acetaminophen (TYLENOL) 325 MG tablet Take 2 tablets (650 mg total) by mouth every 6 (six) hours as needed for mild pain (or Fever >/= 101). 05/08/17  Yes Rai, Ripudeep K, MD  aspirin EC 81 MG tablet Take 1 tablet (81 mg total) by mouth daily. 06/27/18  Yes [name], MD  b complex vitamins  tablet Take 1 tablet by mouth daily.   Yes [provider]  clotrimazole (LOTRIMIN AF) 1 % cream Apply to Lt foot (between) Toes bid for athletes foot Patient taking differently: Apply 1 application topically 2 (two) times daily. Apply to left foot (between) toes twice daily for athletes foot. 09/12/17  Yes Emokpae, Courage, MD  ferrous gluconate (FERGON) 240 (27 FE) MG tablet Take 240 mg by mouth daily.   Yes [provider]  furosemide (LASIX) 80 MG tablet Take 1 tablet (80 mg total) by mouth 2 (two) times daily. 06/27/18  Yes [name], MD  hydrALAZINE (APRESOLINE) 50 MG tablet Take 1.5 tablets (75 mg total) by mouth 3 (three) times daily. 02/09/18  Yes Bensimhon, Shaune Pascal, MD  hydrocortisone cream 1 % Apply 1 application topically 2 (two) times daily.   Yes [provider]  mouth rinse LIQD solution 15 mLs by Mouth Rinse route 2 (two) times daily. 09/26/17  Yes Shelly Coss, MD  benzonatate (TESSALON) 100 MG capsule Take 1 capsule (100 mg total) by  mouth 3 (three) times daily as needed for cough. 03/17/18   Colon Branch, MD  miconazole (MICOTIN) 2 % powder Apply 1 application topically as needed for itching.     [provider]  ondansetron (ZOFRAN) 4 MG tablet Take 1 tablet (4 mg total) by mouth every 6 (six) hours as needed for nausea or vomiting. Patient not taking: Reported on 08/11/2018 09/12/17   Roxan Hockey, MD  OXYGEN Inhale 2 L into the lungs continuous.    [provider]  tamsulosin (FLOMAX) 0.4 MG CAPS capsule Take 1 capsule (0.4 mg total) by mouth daily after supper. Patient taking differently: Take 0.4 mg by mouth daily as needed (urination).  06/13/17   Bonnielee Haff, MD    Physical Exam: Vitals:   07/24/18 0030 07/24/18 0045 07/24/18 0100 07/24/18 0115  BP:      Pulse: 67 69 66 65  Resp: 19 18 16 15   Temp:      TempSrc:      SpO2: 98% 98% 97% 99%  Weight:       General: Not in acute distress HEENT:       Eyes:  PERRL, EOMI, no scleral icterus.       ENT: No discharge from the ears and nose, no pharynx injection, no tonsillar enlargement.        Neck: No JVD, no bruit, no mass felt. Heme: No neck lymph node enlargement. Cardiac: S1/S2, RRR, No murmurs, No gallops or rubs. Respiratory:  No rales, wheezing, rhonchi or rubs. GI: Soft, nondistended, nontender, no rebound pain, no organomegaly, BS present. GU: No hematuria Ext: has trace leg edema bilaterally. Has venous insufficiency change in lower legs.  2+DP/PT pulse bilaterally. Musculoskeletal: No joint deformities, No joint redness or warmth, no limitation of ROM in spin. Skin: No rashes.  Neuro: Alert, oriented X3, cranial nerves II-XII grossly intact, moves all extremities normally.  Psych: Patient is not psychotic, no suicidal or hemocidal ideation.  Labs on Admission: I have personally reviewed following labs and imaging studies  CBC: Recent Labs  Lab 07/19/2018 2048  WBC 26.8*  NEUTROABS 11.9*  HGB 10.3*  HCT 35.5*  MCV 86.6  PLT 546   Basic Metabolic Panel: Recent Labs  Lab 08/09/2018 2048  NA 140  K 3.0*  CL 106  CO2 24  GLUCOSE 110*  BUN 70*  CREATININE 1.41*  CALCIUM 9.9   GFR: Estimated Creatinine Clearance: 53.3 mL/min (A) (by C-G formula based on SCr of 1.41 mg/dL (H)). Liver Function Tests: Recent Labs  Lab 07/26/2018 2048  AST 23  ALT 19  ALKPHOS 110  BILITOT 0.5  PROT 6.7  ALBUMIN 3.4*   No results for input(s): LIPASE, AMYLASE in the last 168 hours. No results for input(s): AMMONIA in the last 168 hours. Coagulation Profile: No results for input(s): INR, PROTIME in the last 168 hours. Cardiac Enzymes: No results for input(s): CKTOTAL, CKMB, CKMBINDEX, TROPONINI in the last 168 hours. BNP (last 3 results) No results for input(s): PROBNP in the last 8760 hours. HbA1C: No results for input(s): HGBA1C in the last 72 hours. CBG: No results for input(s): GLUCAP in the last 168 hours. Lipid Profile: No  results for input(s): CHOL, HDL, LDLCALC, TRIG, CHOLHDL, LDLDIRECT in the last 72 hours. Thyroid Function Tests: No results for input(s): TSH, T4TOTAL, FREET4, T3FREE, THYROIDAB in the last 72 hours. Anemia Panel: No results for input(s): VITAMINB12, FOLATE, FERRITIN, TIBC, IRON, RETICCTPCT in the last 72 hours. Urine analysis:  Component Value Date/Time   COLORURINE YELLOW 08/14/2018 2017   APPEARANCEUR HAZY (A) 07/27/2018 2017   LABSPEC 1.008 08/08/2018 2017   PHURINE 5.0 08/15/2018 2017   GLUCOSEU NEGATIVE 07/31/2018 2017   GLUCOSEU NEGATIVE 05/02/2015 1008   HGBUR NEGATIVE 07/25/2018 2017   BILIRUBINUR NEGATIVE 07/20/2018 2017   [place] 07/22/2018 2017   PROTEINUR 100 (A) 08/14/2018 2017   UROBILINOGEN 0.2 05/02/2015 1008   NITRITE POSITIVE (A) 08/10/2018 2017   LEUKOCYTESUR LARGE (A) 08/15/2018 2017   Sepsis Labs: @LABRCNTIP (procalcitonin:4,lacticidven:4) )No results found for this or any previous visit (from the past 240 hour(s)).   Radiological Exams on Admission: Dg Chest 2 View  Result Date: 08/01/2018 CLINICAL DATA:  Back pain, leg pain EXAM: CHEST - 2 VIEW COMPARISON:  03/24/2018 FINDINGS: Low volume examination with mild diffuse interstitial pulmonary opacity, similar to prior examination and possibly reflecting mild edema. There is no new or focal airspace opacity. Cardiomegaly. 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 IMPRESSION: 1. Low volume examination with possible mild interstitial edema. No new or focal airspace opacity. 2.  Cardiomegaly. 3. 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 Electronically Signed   By: Eddie Candle M.D.   On: 08/12/2018 20:24   Dg Lumbar Spine Complete  Result Date: 08/16/2018 CLINICAL DATA:  Back pain and left leg pain. No known injury. EXAM: LUMBAR SPINE - COMPLETE 4+ VIEW COMPARISON:   Radiograph 09/06/2017, CT 09/05/2017 FINDINGS: Lateral view limited by positioning. There 4 non-rib-bearing lumbar vertebra. Chronic diffuse bony sclerosis with punctate lucencies, unchanged from prior exam. The alignment is grossly maintained. Vertebral body heights are normal. There is no listhesis. The posterior elements are intact. Disc spaces are preserved. No fracture. Sacroiliac joints are congruent. IMPRESSION: 1. No acute abnormality. 2. Chronic and unchanged diffuse bony sclerosis with punctate lucencies from multiple prior exams, likely related to patient's history of myelodysplasia. Electronically Signed   By: Keith Rake M.D.   On: 07/24/2018 20:31   Ct Abdomen Pelvis W Contrast  Result Date: 08/01/2018 CLINICAL DATA:  Acute abdominal pain. Fever. EXAM: CT ABDOMEN AND PELVIS WITH CONTRAST TECHNIQUE: Multidetector CT imaging of the abdomen and pelvis was performed using the standard protocol following bolus administration of intravenous contrast. CONTRAST:  181mL ISOVUE-300 IOPAMIDOL (ISOVUE-300) INJECTION 61% COMPARISON:  CT 09/05/2017, multiple priors FINDINGS: Lower chest: Small pleural effusions. Associated bibasilar atelectasis. Mild cardiomegaly. Hepatobiliary: Prominent liver spanning 22.8 cm cranial caudal. Distended gallbladder with possible punctate gallstone. No biliary dilatation. Pancreas: No ductal dilatation or inflammation. Spleen: Chronic splenomegaly. Adrenals/Urinary Tract: No adrenal nodule. Bilateral hydroureteronephrosis. Ureter is dilated to the bladder insertion. Bilateral perinephric edema. Only minimal excretion on delayed phase imaging. Simple cyst from the lower right kidney. No urolithiasis. Bladder physiologically distended with multiple bladder diverticula and bladder wall thickening. There is perivesicular edema. Stomach/Bowel: Stomach is within normal limits. Appendix appears normal. No evidence of bowel wall thickening, distention, or inflammatory changes.  Colonic diverticulosis without diverticulitis. Surgical clip in the sigmoid colon. Vascular/Lymphatic: Aortic atherosclerosis without aneurysm. No enlarged lymph nodes in the abdomen or pelvis. Reproductive: Prominent prostate gland spans 4.9 cm. Other: No free air or ascites. Small fat containing umbilical hernia. Small skin/subcutaneous lesion in the left groin measures 2.4 x 1.5 cm, previously 1.7 x 1.0 cm. Musculoskeletal: Abnormal marrow with diffuse sclerosis in innumerable lytic lesions, unchanged from priors. Degenerative disc disease at L4-L5 appears similar to prior. IMPRESSION: 1. Bilateral hydroureteronephrosis, the ureters are dilated to the  bladder insertion. Bladder wall thickening with multiple bladder diverticula and perivesicular edema. Findings consistent with chronic bladder outlet obstruction with cystitis and urinary tract infection. 2. Distended gallbladder with possible gallstone. If there is clinical concern for hepatic biliary pathology, recommend right upper quadrant ultrasound. 3. Right groin skin/subcutaneous nodule has slightly increased in size from prior but has been present for multiple years. This should be amenable to direct inspection with physical exam. 4. Chronic findings include colonic diverticulosis and hepatosplenomegaly. 5. 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 Electronically Signed   By: Keith Rake M.D.   On: 07/21/2018 23:41     EKG: Independently reviewed.  Sinus rhythm, QTC 471, LAD, poor R wave progression   Assessment/Plan Principal Problem:   Complicated UTI (urinary tract infection) Active Problems:   Coronary atherosclerosis   CKD (chronic kidney disease) stage 3, GFR 30-59 ml/min (HCC)   Hydronephrosis   Iron deficiency anemia due to chronic blood loss   Hypokalemia   Moderate to severe pulmonary hypertension (HCC)   Chronic respiratory failure with  hypoxia (HCC)   Chronic diastolic CHF (congestive heart failure), NYHA class 2 (HCC)   Myelodysplasia (myelodysplastic syndrome) (HCC)   HTN (hypertension)   Normocytic anemia   Back pain   Complicated UTI (urinary tract infection): pt has leukocytosis with WBC 26.8, which is likely due to myeloproliferative disease.  No fever.  Clinically not septic.  Patient has history of recurrent UTI due to Pseudomonas infection.   - Admit to telemetry bed as inpatient - Cefepime per pharmacy (patient received 1 dose of Rocephin in ED) - Follow-up blood culture and urine culture  Hydronephrosis: Cre is at baseline. CT scan showed bilateral hydroureteronephrosis, the ureters are dilated to the bladder insertion. Bladder wall thickening with multiple bladder diverticula and perivesicular edema. Findings consistent with chronic bladder outlet obstruction with cystitis and urinary tract infection. -will place Foley cath -continue Flomax -May need to discuss with urology in the morning  Coronary atherosclerosis: No CP -Continue aspirin  CKD (chronic kidney disease) stage 3, GFR 30-59 ml/min ([place]): Close to baseline.  Baseline creatinine 1.4.  His creatinine is 1.41, BUN 70. -Follow-up renal function by BMP  Iron deficiency anemia due to chronic blood loss: Hemoglobin 10.3, stable, -Follow-up with CBC  Hypokalemia: K=3.0 on admission. - Repleted - Check Mg level  Chronic diastolic CHF and moderate to severe pulmonary hypertension: 2D echo on 02/07/2018 showed EF of 50-55% with grade 1 diastolic dysfunction.  Patient has trace leg edema, no JVT.  No worsening shortness of breath.  CHF seem to be compensated. -Continue home Lasix 80 mg twice daily and aspirin  Chronic respiratory failure with hypoxia ([place]): Stable. -continue 2 L nasal cannula oxygen  Myelodysplasia (myelodysplastic syndrome) ([place]): has chronic leukocytosis since at least 2015. WBC 22.8 on 06/10/13-->26.8 today. -f/u by  CBC  Essential hypertension: -IV Hydralazine prn -Continue home medications: lasix, hydralazine  Normocytic anemia: Hemoglobin 10.3, stable - Continue iron supplement  Back pain: Unclear etiology, may be related to bilateral hydronephrosis.  L-spine x-ray did not show acute issues. -PRN Tylenol and Norco for pain    Inpatient status:  # Patient requires inpatient status due to high intensity of service, high risk for further deterioration and high frequency of surveillance required.  I certify that at the point of admission it is my clinical judgment that the patient will require inpatient hospital care spanning beyond 2 midnights from the point  of admission.  . This patient has multiple chronic comorbidities including hypertension, diet-controlled diabetes, chronic respiratory failure on 2 L oxygen, ascending aortic aneurysm, CAD, dCHF, CKD 3, iron deficiency anemia, OSA not on CPAP, myeloproliferative disease, splenic infarct, hydronephrosis, recurrent UTI due to Pseudomonas . Now patient has presenting with back pain, bilateral hydronephrosis, recurrent complicated UTI likely due to Pseudomonas infection . The worrisome physical exam findings include back pain, mild leg edema . The initial radiographic and laboratory data are worrisome because of bilateral hydronephrosis, positive urinalysis, hypokalemia . Current medical needs: please see my assessment and plan . Predictability of an adverse outcome (risk): Patient has very complicated multiple comorbidities, now presents with recurrent complicated UTI, likely due to Pseudomonas infection.  Patient also has bilateral hydronephrosis.  He is at high risk for deteriorating.  Will need to be treated in hospital for at least 2 days.     DVT ppx: SQ Heparin    Code Status: Full code Family Communication: None at bed side.  Disposition Plan:  Anticipate discharge back to previous home environment Consults called:  none Admission status:   Inpatient/tele      Date of Service 07/24/2018    [facility]   If 7PM-7AM, please contact night-coverage [email or web address] Password [email or web address] 07/24/2018, 1:26 AM

## 2018-07-24 NOTE — Progress Notes (Signed)
Pharmacy Antibiotic Note  Juan [name] is a 78 y.o. male with generalized aching back pain admitted on 08/05/2018 with UTI.  Pharmacy has been consulted for cefepime dosing. Hx of pseudomonal UTI.   Plan: Cefepime 2 GM IV q12h F/u scr/cultures     Temp (24hrs), Avg:98.9 F (37.2 C), Min:98.2 F (36.8 C), Max:99.6 F (37.6 C)  Recent Labs  Lab 07/22/2018 2048  WBC 26.8*  CREATININE 1.41*  LATICACIDVEN 0.8    CrCl cannot be calculated (Unknown ideal weight.).    No Known Allergies  Antimicrobials this admission: 3/7 rocephin >> x1 3/8 cefepime >>   Dose adjustments this admission:   Microbiology results:  BCx:   UCx:    Sputum:    MRSA PCR:   Thank you for allowing pharmacy to be a part of this patient's care.  Dorrene German 07/24/2018 12:21 AM

## 2018-07-24 NOTE — Progress Notes (Signed)
Juan [name] is a 78 y.o. male with medical history significant of hypertension, diet-controlled diabetes, chronic respiratory failure on 2 L oxygen, ascending aortic aneurysm, CAD, dCHF, CKD 3, iron deficiency anemia, OSA not on CPAP, myeloproliferative disease, splenic infarct, hydronephrosis, recurrent pseudomonal UTI who presents with dysuria and back pain.  Upon presentation WBC 26.8, positive urinalysis.  CT-abdomen/pelvis showed:  1. Bilateral hydroureteronephrosis, the ureters are dilated to the bladder insertion. Bladder wall thickening with multiple bladder diverticula and perivesicular edema. Findings consistent with chronic bladder outlet obstruction with cystitis and urinary tract infection. 2. Distended gallbladder with possible gallstone. If there is clinical concern for hepatic biliary pathology, recommend right upper quadrant ultrasound. 3. Right groin skin/subcutaneous nodule has slightly increased in size from prior but has been present for multiple years. This should be amenable to direct inspection with physical exam. 4. Chronic findings include colonic diverticulosis and hepatosplenomegaly. 5. 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  X-ray of L-spinE 1. No acute abnormality. 2. Chronic and unchanged diffuse bony sclerosis with punctate lucencies from multiple prior exams, likely related to patient's history of myelodysplasia.  07/24/18: Patient seen and examined at his bedside.  Reports persistent dysuria.  Has a condom catheter in place.  Recorded -450 cc urine output since this morning.  Admitted for sepsis secondary to complicated UTI.  Currently on cefepime.  Leukocytosis improving from 20 6K to 20 1K.  Renal function also improving on p.o. Lasix 80 mg daily with creatinine from 1.41 to 1.35.  Vital signs reviewed and are stable.  Continue current management and continue to closely  monitor on telemetry.  The writer paged Dr. Louis Meckel, patient's urologist on 07/24/18 for a possible consult due to bilateral hydroureteronephrosis.  Please refer to H&P dictated by Dr. Blaine Hamper on 07/24/2018 for further details of the assessment and plan.

## 2018-07-24 NOTE — Progress Notes (Signed)
Patient arrived at approximately 0152 from the ED. He is alert and verbally responsive x 4. No complaints voiced at this time. Cardiac monitoring in place x 24 hrs as ordered.

## 2018-07-25 LAB — CBC
HEMATOCRIT: 35.7 % — AB (ref 39.0–52.0)
Hemoglobin: 10.3 g/dL — ABNORMAL LOW (ref 13.0–17.0)
MCH: 25.2 pg — ABNORMAL LOW (ref 26.0–34.0)
MCHC: 28.9 g/dL — ABNORMAL LOW (ref 30.0–36.0)
MCV: 87.5 fL (ref 80.0–100.0)
Platelets: 142 10*3/uL — ABNORMAL LOW (ref 150–400)
RBC: 4.08 MIL/uL — ABNORMAL LOW (ref 4.22–5.81)
RDW: 18.2 % — ABNORMAL HIGH (ref 11.5–15.5)
WBC: 30.7 10*3/uL — ABNORMAL HIGH (ref 4.0–10.5)
nRBC: 1.1 % — ABNORMAL HIGH (ref 0.0–0.2)

## 2018-07-25 LAB — BASIC METABOLIC PANEL
Anion gap: 11 (ref 5–15)
BUN: 64 mg/dL — ABNORMAL HIGH (ref 8–23)
CO2: 22 mmol/L (ref 22–32)
Calcium: 9.7 mg/dL (ref 8.9–10.3)
Chloride: 104 mmol/L (ref 98–111)
Creatinine, Ser: 1.71 mg/dL — ABNORMAL HIGH (ref 0.61–1.24)
GFR calc Af Amer: 44 mL/min — ABNORMAL LOW (ref >60)
GFR calc non Af Amer: 38 mL/min — ABNORMAL LOW (ref >60)
GLUCOSE: 120 mg/dL — AB (ref 70–99)
Potassium: 3.6 mmol/L (ref 3.5–5.1)
Sodium: 137 mmol/L (ref 135–145)

## 2018-07-25 LAB — MAGNESIUM: Magnesium: 2.4 mg/dL (ref 1.7–2.4)

## 2018-07-25 MED ORDER — PHENAZOPYRIDINE HCL 100 MG PO TABS
100.0000 mg | ORAL_TABLET | Freq: Three times a day (TID) | ORAL | Status: AC
  Administered 2018-07-25 – 2018-07-26 (×6): 100 mg via ORAL
  Filled 2018-07-25 (×6): qty 1

## 2018-07-25 MED ORDER — PROMETHAZINE HCL 25 MG/ML IJ SOLN: 6.2500 mg | Freq: Three times a day (TID) | INTRAMUSCULAR | Status: DC | PRN

## 2018-07-25 MED ORDER — HYDRALAZINE HCL 50 MG PO TABS
100.0000 mg | ORAL_TABLET | Freq: Three times a day (TID) | ORAL | Status: DC
  Administered 2018-07-25 – 2018-07-26 (×5): 100 mg via ORAL
  Filled 2018-07-25 (×7): qty 2

## 2018-07-25 NOTE — Progress Notes (Signed)
Pt's bladder scan showed 401 cc's. No urge to void at this time. Dr. Nevada Crane paged and made aware.

## 2018-07-25 NOTE — Progress Notes (Signed)
PT Cancellation Note  Patient Details Name: Hau Sanor MRN: [id number] DOB: 1940/12/13   Cancelled Treatment:    Reason Eval/Treat Not Completed: Attempted PT eval-pt declined participation on today. He requested PT to check back tomorrow.    Weston Anna, PT Acute Rehabilitation Services Pager: [phone] Office: [phone]

## 2018-07-25 NOTE — Progress Notes (Addendum)
PROGRESS NOTE  Juan [name] PPJ:[id number] DOB: Aug 18, 1940 DOA: 07/27/2018 PCP: Colon Branch, MD  HPI/Recap of past 24 hours: [name] a 78 y.o.malewith medical history significant ofhypertension, diet-controlled diabetes, chronic respiratory failure on 2 L oxygen, ascending aortic aneurysm, CAD,dCHF, CKD 3, iron deficiency anemia, OSA not on CPAP, myeloproliferative disease, splenic infarct, hydronephrosis, recurrent pseudomonal UTI who presents with dysuria and back pain.  Upon presentation WBC 26.8, positive urinalysis.  07/25/18: Patient seen and examined at bedside.  Reports nausea and vomiting this morning.  Also reports persistent dysuria.  Started on Pyridium.  Dr. Louis Meckel will see in consultation.    Assessment/Plan: Principal Problem:   Complicated UTI (urinary tract infection) Active Problems:   Coronary atherosclerosis   CKD (chronic kidney disease) stage 3, GFR 30-59 ml/min (HCC)   Hydronephrosis   Iron deficiency anemia due to chronic blood loss   Hypokalemia   Moderate to severe pulmonary hypertension (HCC)   Chronic respiratory failure with hypoxia (HCC)   Chronic diastolic CHF (congestive heart failure), NYHA class 2 (HCC)   Myelodysplasia (myelodysplastic syndrome) (HCC)   HTN (hypertension)   Normocytic anemia   Back pain  Pseudomonas UTI with lower urinary tract symptoms Positive UA Urine culture positive for greater than 100,000 colonies of pseudomonas aeruginosa Continue cefepime Awaiting results of sensitivities Blood cultures x2 no growth x1 day Persistent dysuria Start Pyridium 100 mg 3 times daily  AKI on CKD (chronic kidney disease) stage 3, GFR 30-59 ml/min ([place]):  Presented with creatinine of 1.35 which appears to be his baseline with GFR 59 Creatinine today 07/25/18 is 1.71 with GFR 44 Hold p.o. Lasix due to AKI Monitor urine output Avoid nephrotoxic agents/dehydration/hypotension Repeat BMP in the morning  Bilateral  hydronephrosis Follows with urology outpatient Dr. Louis Meckel will see in consultation Check bladder scan; if urinary retention present will insert Foley cath No prior ureteral stent Continue Flomax  Intractable nausea and vomiting Suspect secondary to UTI Continue IV Zofran and IV Phenergan for intractable vomiting  Uncontrolled hypertension Increased hydralazine dose to 100 mg 3 times daily Hold Lasix 80 mg twice daily due to AKI Continue to monitor urine output and electrolytes  Physical debility/ambulatory dysfunction PT to assess Fall precautions Requests wheelchair privilege  Coronary atherosclerosis:  -No CP -Continue aspirin  Iron deficiency anemia due to chronic blood loss:  Hemoglobin 10.3, stable, -Follow-up with CBC  Resolved hypokalemia post repletion   Potassium 3.6 on 07/25/2018 Magnesium 2.4 on 09/22/996  Chronic diastolic CHF and moderate to severe pulmonary hypertension:  2D echo on 02/07/2018 showed EF of 50-55% with grade 1 diastolic dysfunction.  Patient has trace leg edema, no JVT.  No worsening shortness of breath.  CHF seem to be compensated. -Hold home Lasix 80 mg twice daily due to AKI -Continue aspirin  Mild thrombocytopenia Platelet stable at 142K   Chronic respiratory failure with hypoxia ([place]): Stable. 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 WBC 22.8 on 06/10/13-->26.8 today. -f/u by CBC  Normocytic anemia: Hemoglobin 10.3, stable - Continue iron supplement  Back pain: Unclear etiology, may be related to bilateral hydronephrosis.  L-spine x-ray did not show acute issues. -PRN Tylenol and Norco for pain    DVT ppx: SQ Heparin 3 times daily   Code Status: Full code Family Communication: None at bed side.  Disposition Plan:  Anticipate discharge back to previous home environment Consults called:  Urology      Objective: Vitals:  07/24/18 1530 07/24/18 1758 07/24/18 2159 07/25/18 0637  BP: (!) 145/73  (!) 171/72 134/68  Pulse: 89  98 81  Resp: 20  20 16   Temp: 100 F (37.8 C) 98.1 F (36.7 C) 98.3 F (36.8 C) 99.3 F (37.4 C)  TempSrc: Oral Oral Oral Oral  SpO2: 98%  96% 99%  Weight:      Height:        Intake/Output Summary (Last 24 hours) at 07/25/2018 1302 Last data filed at 07/25/2018 1244 Gross per 24 hour  Intake 961.05 ml  Output 1675 ml  Net -713.95 ml   Filed Weights   07/24/18 0000 07/24/18 0155  Weight: 92.5 kg 82.7 kg    Exam:  . General: 78 y.o. year-old male chronically ill-appearing appears uncomfortable due to nausea.  Alert and interactive. . Cardiovascular: Regular rate and rhythm with no rubs or gallops.  No thyromegaly or JVD noted.   Marland Kitchen Respiratory: Clear to auscultation with no wheezes or rales. Good inspiratory effort. . Abdomen: Soft nontender nondistended with normal bowel sounds x4 quadrants. . Musculoskeletal: Trace lower extremity edema. 2/4 pulses in all 4 extremities. Marland Kitchen Psychiatry: Mood is appropriate for condition and setting   Data Reviewed: CBC: Recent Labs  Lab 08/05/2018 2048 07/24/18 0456 07/25/18 0650  WBC 26.8* 21.1* 30.7*  NEUTROABS 11.9*  --   --   HGB 10.3* 9.4* 10.3*  HCT 35.5* 33.5* 35.7*  MCV 86.6 87.9 87.5  PLT 165 146* 272*   Basic Metabolic Panel: Recent Labs  Lab 08/15/2018 2048 07/24/18 0456 07/25/18 0650  NA 140 141 137  K 3.0* 3.5 3.6  CL 106 109 104  CO2 24 23 22   GLUCOSE 110* 111* 120*  BUN 70* 64* 64*  CREATININE 1.41* 1.35* 1.71*  CALCIUM 9.9 9.5 9.7  MG  --  2.4 2.4   GFR: Estimated Creatinine Clearance: 43 mL/min (A) (by C-G formula based on SCr of 1.71 mg/dL (H)). Liver Function Tests: Recent Labs  Lab 08/04/2018 2048  AST 23  ALT 19  ALKPHOS 110  BILITOT 0.5  PROT 6.7  ALBUMIN 3.4*   No results for input(s): LIPASE, AMYLASE in the last 168 hours. No results for input(s): AMMONIA in the last 168 hours. Coagulation  Profile: No results for input(s): INR, PROTIME in the last 168 hours. Cardiac Enzymes: No results for input(s): CKTOTAL, CKMB, CKMBINDEX, TROPONINI in the last 168 hours. BNP (last 3 results) No results for input(s): PROBNP in the last 8760 hours. HbA1C: No results for input(s): HGBA1C in the last 72 hours. CBG: No results for input(s): GLUCAP in the last 168 hours. Lipid Profile: No results for input(s): CHOL, HDL, LDLCALC, TRIG, CHOLHDL, LDLDIRECT in the last 72 hours. Thyroid Function Tests: No results for input(s): TSH, T4TOTAL, FREET4, T3FREE, THYROIDAB in the last 72 hours. Anemia Panel: No results for input(s): VITAMINB12, FOLATE, FERRITIN, TIBC, IRON, RETICCTPCT in the last 72 hours. Urine analysis:    Component Value Date/Time   COLORURINE YELLOW 08/13/2018 2017   APPEARANCEUR HAZY (A) 07/25/2018 2017   LABSPEC 1.008 08/12/2018 2017   PHURINE 5.0 07/30/2018 2017   GLUCOSEU NEGATIVE 08/11/2018 2017   GLUCOSEU NEGATIVE 05/02/2015 1008   HGBUR NEGATIVE 07/26/2018 2017   BILIRUBINUR NEGATIVE 07/24/2018 2017   KETONESUR NEGATIVE 08/03/2018 2017   PROTEINUR 100 (A) 08/07/2018 2017   UROBILINOGEN 0.2 05/02/2015 1008   NITRITE POSITIVE (A) 07/31/2018 2017   LEUKOCYTESUR LARGE (A) 08/15/2018 2017   Sepsis Labs: @LABRCNTIP (procalcitonin:4,lacticidven:4)  ) Recent Results (  from the past 240 hour(s))  Urine culture     Status: Abnormal (Preliminary result)   Collection Time: 08/05/2018  8:17 PM  Result Value Ref Range Status   Specimen Description   Final    URINE, RANDOM Performed at [facility] [address] [place], [place] [address]    Special Requests   Final    NONE Performed at [facility], [place] [address] [place], [place] [address]    Culture (A)  Final    >=100,000 COLONIES/mL PSEUDOMONAS AERUGINOSA SUSCEPTIBILITIES TO FOLLOW Performed at [facility], [place] [address], [place], [place] [address]    Report Status  PENDING  Incomplete  Culture, blood (Routine X 2) w Reflex to ID Panel     Status: None (Preliminary result)   Collection Time: 07/24/18 12:59 AM  Result Value Ref Range Status   Specimen Description   Final    BLOOD RIGHT ARM Performed at [facility] [address], [place], [place] [address]    Special Requests   Final    BOTTLES DRAWN AEROBIC AND ANAEROBIC Blood Culture adequate volume Performed at [facility] [address], [place], [place] [address]    Culture   Final    NO GROWTH 1 DAY Performed at [facility], [place] [address] [place], [place] [address]    Report Status PENDING  Incomplete  Culture, blood (Routine X 2) w Reflex to ID Panel     Status: None (Preliminary result)   Collection Time: 07/24/18 12:59 AM  Result Value Ref Range Status   Specimen Description   Final    BLOOD LEFT HAND Performed at [facility] [address], [place], [place] [address]    Special Requests   Final    BOTTLES DRAWN AEROBIC AND ANAEROBIC Blood Culture adequate volume Performed at [facility] [address] [place], [place] [address]    Culture   Final    NO GROWTH 1 DAY Performed at [facility], [place] [address], [place], [place] [address]    Report Status PENDING  Incomplete  MRSA PCR Screening     Status: None   Collection Time: 07/24/18  4:49 AM  Result Value Ref Range Status   MRSA by PCR NEGATIVE NEGATIVE Final    Comment:        The GeneXpert MRSA Assay (FDA approved for NASAL specimens only), is one component of a comprehensive MRSA colonization surveillance program. It is not intended to diagnose MRSA infection nor to guide or monitor treatment for MRSA infections. Performed at [facility], [place] [address] [place], [place] [address]       Studies: No results found.  Scheduled Meds: . aspirin EC  81 mg Oral Daily  . B-complex with vitamin C  1  tablet Oral Daily  . clotrimazole  1 application Topical BID  . ferrous gluconate  324 mg Oral Daily  . furosemide  80 mg Oral BID  . heparin  5,000 Units Subcutaneous Q8H  . hydrALAZINE  100 mg Oral TID  . hydrocortisone cream  1 application Topical BID  . mouth rinse  15 mL Mouth Rinse BID  . phenazopyridine  100 mg Oral TID WC  . potassium chloride SA  20 mEq Oral Once  . tamsulosin  0.4 mg Oral Daily    Continuous Infusions: . sodium chloride Stopped (07/24/18 1137)  . ceFEPime (MAXIPIME) IV  Stopped (07/25/18 1125)     LOS: 1 day     Kayleen Memos, MD [facility] Pager [phone]  If 7PM-7AM, please contact night-coverage [email or web address] Password [email or web address] 07/25/2018, 1:02 PM

## 2018-07-26 ENCOUNTER — Encounter (HOSPITAL_COMMUNITY): Payer: Self-pay | Admitting: Radiology

## 2018-07-26 ENCOUNTER — Inpatient Hospital Stay (HOSPITAL_COMMUNITY): Payer: Medicare Other

## 2018-07-26 LAB — BASIC METABOLIC PANEL
Anion gap: 11 (ref 5–15)
BUN: 74 mg/dL — ABNORMAL HIGH (ref 8–23)
CO2: 20 mmol/L — ABNORMAL LOW (ref 22–32)
Calcium: 9.3 mg/dL (ref 8.9–10.3)
Chloride: 103 mmol/L (ref 98–111)
Creatinine, Ser: 2.01 mg/dL — ABNORMAL HIGH (ref 0.61–1.24)
GFR calc Af Amer: 36 mL/min — ABNORMAL LOW (ref >60)
GFR calc non Af Amer: 31 mL/min — ABNORMAL LOW (ref >60)
Glucose, Bld: 109 mg/dL — ABNORMAL HIGH (ref 70–99)
Potassium: 3.9 mmol/L (ref 3.5–5.1)
Sodium: 134 mmol/L — ABNORMAL LOW (ref 135–145)

## 2018-07-26 LAB — CBC
HCT: 32.6 % — ABNORMAL LOW (ref 39.0–52.0)
HEMOGLOBIN: 9.4 g/dL — AB (ref 13.0–17.0)
MCH: 25 pg — ABNORMAL LOW (ref 26.0–34.0)
MCHC: 28.8 g/dL — ABNORMAL LOW (ref 30.0–36.0)
MCV: 86.7 fL (ref 80.0–100.0)
Platelets: 129 10*3/uL — ABNORMAL LOW (ref 150–400)
RBC: 3.76 MIL/uL — ABNORMAL LOW (ref 4.22–5.81)
RDW: 18 % — ABNORMAL HIGH (ref 11.5–15.5)
WBC: 22.8 10*3/uL — ABNORMAL HIGH (ref 4.0–10.5)
nRBC: 0.4 % — ABNORMAL HIGH (ref 0.0–0.2)

## 2018-07-26 LAB — URINE CULTURE: Culture: >=100000 — AB

## 2018-07-26 NOTE — Progress Notes (Signed)
PT Cancellation Note  Patient Details Name: Juan [name] MRN: [id number] DOB: Feb 02, 1941   Cancelled Treatment:    Reason Eval/Treat Not Completed: Patient declined, no reason specified--2nd refusal. Will check back another day. If pt refuses a 3rd time, will sign off.    Weston Anna, PT Acute Rehabilitation Services Pager: [phone] Office: [phone]

## 2018-07-26 NOTE — Progress Notes (Signed)
PROGRESS NOTE  Juan [name] WGY:[id number] DOB: January 07, 1941 DOA: 07/22/2018 PCP: Colon Branch, MD  HPI/Recap of past 24 hours: Juan [name] a 78 y.o.malewith medical history significant ofhypertension, diet-controlled diabetes, chronic respiratory failure on 2 L oxygen, ascending aortic aneurysm, CAD,dCHF, CKD 3, iron deficiency anemia, OSA not on CPAP, myeloproliferative disease, splenic infarct, hydronephrosis, recurrent pseudomonal UTI who presents with dysuria and back pain.  WBC 26.8, positive urinalysis.  TRH asked to admit.  Admitted for complicated UTI.  Hospital course complicated by lower urinary tract symptoms for which he was started on Pyridium with improvement of his symptoms.  Due to bilateral hydronephrosis and urinary retention a Foley catheter was placed in on 07/25/2018 and Dr. Louis Meckel was called for consultation.  07/26/18: Patient seen and examined at bedside.  No acute events overnight.    He reports generalized weakness prior to coming to the hospital for which he uses a cane and a walker.  States his arms hurt and would not lift his left arm, states it is not new.  Was having problem with it at home before he came to the hospital.  Does not participate in physical exam.  Will obtain a stat CT head with no contrast to further assess.    Assessment/Plan: Principal Problem:   Complicated UTI (urinary tract infection) Active Problems:   Coronary atherosclerosis   CKD (chronic kidney disease) stage 3, GFR 30-59 ml/min (HCC)   Hydronephrosis   Iron deficiency anemia due to chronic blood loss   Hypokalemia   Moderate to severe pulmonary hypertension (HCC)   Chronic respiratory failure with hypoxia (HCC)   Chronic diastolic CHF (congestive heart failure), NYHA class 2 (HCC)   Myelodysplasia (myelodysplastic syndrome) (HCC)   HTN (hypertension)   Normocytic anemia   Back pain  Pseudomonas UTI with lower urinary tract symptoms Positive UA Urine culture positive for  greater than 100,000 colonies of pseudomonas aeruginosa Continue cefepime Urine culture showing sensitivity to cefepime Sensitivities with resistance to ciprofloxacin Blood cultures x2 no growth x 2 days Lower urinary tract symptoms improved with Pyridium 100 mg 3 times daily  Left upper extremity weakness, present on admission Patient reports his left upper extremity was weak prior to coming to the hospital Would not lift his left arm on exam due to weakness Sensory intact We will obtain stat CT head with no contrast to further assess  AKI on CKD (chronic kidney disease) stage 3, GFR 30-59 ml/min (HCC) suspect post renal secondary to urinary retention with bilateral hydronephrosis:  Presented with creatinine of 1.35 which appears to be his baseline with GFR 59 Creatinine today 07/26/2018 is 2.01 from 1.71 yesterday Was on Lasix 80 mg p.o. twice daily at home Hold off Lasix for now Continue to avoid nephrotoxic agents/hypotension  Urinary retention Bladder scan done on 07/25/2018 revealed 401 cc of urine Foley catheter placed Continue to monitor urine output Last recorded urine output 175 cc in the last 24 hours from 900 cc recorded previously, unclear if accurate  Bilateral hydronephrosis Follows with urology outpatient Dr. Louis Meckel will see in consultation Continue Foley catheter No prior ureteral stent Continue Flomax  Resolved intractable nausea and vomiting Suspect secondary to UTI Continue IV Zofran and IV Phenergan for intractable vomiting as needed  Resolved uncontrolled hypertension Continue hydralazine dose to 100 mg 3 times daily Continue to hold Lasix 80 mg twice daily due to AKI Continue to monitor vital signs  Physical debility/ambulatory dysfunction PT to assess Fall precautions Requests wheelchair privilege Patient is  from home and will most likely need SNF placement CSW consulted to assist with placement  Coronary atherosclerosis:  -No CP -Continue  aspirin  Iron deficiency anemia  Hemoglobin stable 9.4 from 10.3 No sign of overt bleeding Continue ferrous sulfate 325 mg p.o. daily  Resolved hypokalemia post repletion   Potassium 3.9 on 07/26/2018 Magnesium 2.4 on 01/17/6944  Chronic diastolic CHF and moderate to severe pulmonary hypertension:  2D echo on 02/07/2018 showed EF of 50-55% with grade 1 diastolic dysfunction.  Patient has trace leg edema, no JVT.  No worsening shortness of breath.  CHF seem to be compensated. -Hold home Lasix 80 mg twice daily due to AKI -Continue aspirin  Mild thrombocytopenia Platelets stable at 129 No sign of overt bleeding  Chronic respiratory failure with hypoxia ([place]):  -Stable. -continue 2 L nasal cannula oxygen; which is his baseline.  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  WBC 22.8 on 06/10/13--> 22.8 on 07/26/2018.  Back pain: Unclear etiology, may be related to bilateral hydronephrosis.  L-spine x-ray did not show acute issues. -PRN Tylenol and Norco for pain    DVT ppx: SQ Heparin 3 times daily   Code Status: Full code Family Communication: None at bed side.  Disposition Plan:   Possibly to SNF in 2 to 3 days or when urology signs off. Consults called:  Urology      Objective: Vitals:   07/25/18 0637 07/25/18 1324 07/25/18 2144 07/26/18 0936  BP: 134/68 (!) 144/74 (!) 128/57 117/62  Pulse: 81 79 92 83  Resp: 16 18 16    Temp: 99.3 F (37.4 C) 98.2 F (36.8 C) 99.3 F (37.4 C)   TempSrc: Oral Oral Oral   SpO2: 99% 95% 96%   Weight:      Height:        Intake/Output Summary (Last 24 hours) at 07/26/2018 1151 Last data filed at 07/26/2018 0913 Gross per 24 hour  Intake 660 ml  Output 1075 ml  Net -415 ml   Filed Weights   07/24/18 0000 07/24/18 0155  Weight: 92.5 kg 82.7 kg    Exam:  . General: 78 y.o. year-old male chronically ill-appearing appears uncomfortable due to generalized weakness.   Alert and oriented x3. . Cardiovascular: Regular rate and rhythm with no rubs or gallops.  No JVD or thyromegaly. Marland Kitchen Respiratory: Clear to auscultation with no wheezes or rales.  Poor inspiratory effort. . Abdomen: Soft nontender nondistended with normal bowel sounds x4 quadrants. . Musculoskeletal: Dependent edema in upper and lower extremities.  Left upper extremity weak and does not participate in his physical exam.  Would not lift or move it. Marland Kitchen Psychiatry: Mood is anxious.   Data Reviewed: CBC: Recent Labs  Lab 07/27/2018 2048 07/24/18 0456 07/25/18 0650 07/26/18 0724  WBC 26.8* 21.1* 30.7* 22.8*  NEUTROABS 11.9*  --   --   --   HGB 10.3* 9.4* 10.3* 9.4*  HCT 35.5* 33.5* 35.7* 32.6*  MCV 86.6 87.9 87.5 86.7  PLT 165 146* 142* 038*   Basic Metabolic Panel: Recent Labs  Lab 08/05/2018 2048 07/24/18 0456 07/25/18 0650 07/26/18 0724  NA 140 141 137 134*  K 3.0* 3.5 3.6 3.9  CL 106 109 104 103  CO2 24 23 22  20*  GLUCOSE 110* 111* 120* 109*  BUN 70* 64* 64* 74*  CREATININE 1.41* 1.35* 1.71* 2.01*  CALCIUM 9.9 9.5 9.7 9.3  MG  --  2.4 2.4  --    GFR: Estimated Creatinine  Clearance: 36.6 mL/min (A) (by C-G formula based on SCr of 2.01 mg/dL (H)). Liver Function Tests: Recent Labs  Lab 08/14/2018 2048  AST 23  ALT 19  ALKPHOS 110  BILITOT 0.5  PROT 6.7  ALBUMIN 3.4*   No results for input(s): LIPASE, AMYLASE in the last 168 hours. No results for input(s): AMMONIA in the last 168 hours. Coagulation Profile: No results for input(s): INR, PROTIME in the last 168 hours. Cardiac Enzymes: No results for input(s): CKTOTAL, CKMB, CKMBINDEX, TROPONINI in the last 168 hours. BNP (last 3 results) No results for input(s): PROBNP in the last 8760 hours. HbA1C: No results for input(s): HGBA1C in the last 72 hours. CBG: No results for input(s): GLUCAP in the last 168 hours. Lipid Profile: No results for input(s): CHOL, HDL, LDLCALC, TRIG, CHOLHDL, LDLDIRECT in the last 72  hours. Thyroid Function Tests: No results for input(s): TSH, T4TOTAL, FREET4, T3FREE, THYROIDAB in the last 72 hours. Anemia Panel: No results for input(s): VITAMINB12, FOLATE, FERRITIN, TIBC, IRON, RETICCTPCT in the last 72 hours. Urine analysis:    Component Value Date/Time   COLORURINE YELLOW 07/22/2018 2017   APPEARANCEUR HAZY (A) 07/19/2018 2017   LABSPEC 1.008 07/18/2018 2017   PHURINE 5.0 08/03/2018 2017   GLUCOSEU NEGATIVE 07/20/2018 2017   GLUCOSEU NEGATIVE 05/02/2015 1008   HGBUR NEGATIVE 08/06/2018 2017   BILIRUBINUR NEGATIVE 07/21/2018 2017   KETONESUR NEGATIVE 07/30/2018 2017   PROTEINUR 100 (A) 07/22/2018 2017   UROBILINOGEN 0.2 05/02/2015 1008   NITRITE POSITIVE (A) 07/24/2018 2017   LEUKOCYTESUR LARGE (A) 07/22/2018 2017   Sepsis Labs: @LABRCNTIP (procalcitonin:4,lacticidven:4)  ) Recent Results (from the past 240 hour(s))  Urine culture     Status: Abnormal   Collection Time: 07/25/2018  8:17 PM  Result Value Ref Range Status   Specimen Description   Final    URINE, RANDOM Performed at [facility], [place] [address], [place], [place] [address]    Special Requests   Final    NONE Performed at [facility], [place] [address], [place], [place] [address]    Culture >=100,000 COLONIES/mL PSEUDOMONAS AERUGINOSA (A)  Final   Report Status 07/26/2018 FINAL  Final   Organism ID, Bacteria PSEUDOMONAS AERUGINOSA (A)  Final      Susceptibility   Pseudomonas aeruginosa - MIC*    CEFTAZIDIME <=1 SENSITIVE Sensitive     CIPROFLOXACIN >=4 RESISTANT Resistant     GENTAMICIN <=1 SENSITIVE Sensitive     IMIPENEM 2 SENSITIVE Sensitive     PIP/TAZO <=4 SENSITIVE Sensitive     CEFEPIME 4 SENSITIVE Sensitive     * >=100,000 COLONIES/mL PSEUDOMONAS AERUGINOSA  Culture, blood (Routine X 2) w Reflex to ID Panel     Status: None (Preliminary result)   Collection Time: 07/24/18 12:59 AM  Result Value Ref Range Status   Specimen Description    Final    BLOOD RIGHT ARM Performed at [facility], [place] [address], [place], [place] [address]    Special Requests   Final    BOTTLES DRAWN AEROBIC AND ANAEROBIC Blood Culture adequate volume Performed at [facility] [address] [place], [place] [address]    Culture   Final    NO GROWTH 2 DAYS Performed at [facility] [address], [place],  [address]    Report Status PENDING  Incomplete  Culture, blood (Routine X 2) w Reflex to ID Panel     Status: None (Preliminary result)  Collection Time: 07/24/18 12:59 AM  Result Value Ref Range Status   Specimen Description   Final    BLOOD LEFT HAND Performed at [facility] [address], [place], [place] [address]    Special Requests   Final    BOTTLES DRAWN AEROBIC AND ANAEROBIC Blood Culture adequate volume Performed at [facility] [address] [place], [place] [address]    Culture   Final    NO GROWTH 2 DAYS Performed at [facility] [address], [place], [place] [address]    Report Status PENDING  Incomplete  MRSA PCR Screening     Status: None   Collection Time: 07/24/18  4:49 AM  Result Value Ref Range Status   MRSA by PCR NEGATIVE NEGATIVE Final    Comment:        The GeneXpert MRSA Assay (FDA approved for NASAL specimens only), is one component of a comprehensive MRSA colonization surveillance program. It is not intended to diagnose MRSA infection nor to guide or monitor treatment for MRSA infections. Performed at [facility], [place] [address], [place], [place] [address]       Studies: No results found.  Scheduled Meds: . aspirin EC  81 mg Oral Daily  . B-complex with vitamin C  1 tablet Oral Daily  . clotrimazole  1 application Topical BID  . ferrous gluconate  324 mg Oral Daily  . heparin  5,000 Units Subcutaneous Q8H  . hydrALAZINE  100 mg Oral TID  . hydrocortisone cream  1 application  Topical BID  . mouth rinse  15 mL Mouth Rinse BID  . phenazopyridine  100 mg Oral TID WC  . tamsulosin  0.4 mg Oral Daily    Continuous Infusions: . sodium chloride Stopped (07/24/18 1137)  . ceFEPime (MAXIPIME) IV 2 g (07/26/18 0934)     LOS: 2 days     Kayleen Memos, MD [facility] Pager [phone]  If 7PM-7AM, please contact night-coverage [email or web address] Password [email or web address] 07/26/2018, 11:51 AM

## 2018-07-26 NOTE — Plan of Care (Signed)
  Problem: Clinical Measurements: Goal: Will remain free from infection Outcome: Progressing   Problem: Clinical Measurements: Goal: Ability to maintain clinical measurements within normal limits will improve Outcome: Progressing   Problem: Health Behavior/Discharge Planning: Goal: Ability to manage health-related needs will improve Outcome: Progressing   Problem: Clinical Measurements: Goal: Cardiovascular complication will be avoided Outcome: Progressing   Problem: Activity: Goal: Risk for activity intolerance will decrease Outcome: Progressing

## 2018-07-26 NOTE — Progress Notes (Signed)
Pt continues to respond nursing staff on a limited bases at times this afternoon. He refused his meds at [facility] and continues to be sleepy though arousable overall. Pt also continues to moan in pain then go back to resting. rn will continue to monitor pt overall.

## 2018-07-27 LAB — COMPREHENSIVE METABOLIC PANEL
ALT: 13 U/L (ref 0–44)
AST: 23 U/L (ref 15–41)
Albumin: 2.6 g/dL — ABNORMAL LOW (ref 3.5–5.0)
Alkaline Phosphatase: 84 U/L (ref 38–126)
Anion gap: 14 (ref 5–15)
BUN: 86 mg/dL — ABNORMAL HIGH (ref 8–23)
CO2: 17 mmol/L — ABNORMAL LOW (ref 22–32)
Calcium: 9.1 mg/dL (ref 8.9–10.3)
Chloride: 107 mmol/L (ref 98–111)
Creatinine, Ser: 2.13 mg/dL — ABNORMAL HIGH (ref 0.61–1.24)
GFR calc Af Amer: 34 mL/min — ABNORMAL LOW (ref >60)
GFR calc non Af Amer: 29 mL/min — ABNORMAL LOW (ref >60)
Glucose, Bld: 99 mg/dL (ref 70–99)
POTASSIUM: 3.9 mmol/L (ref 3.5–5.1)
Sodium: 138 mmol/L (ref 135–145)
Total Bilirubin: 1 mg/dL (ref 0.3–1.2)
Total Protein: 6.1 g/dL — ABNORMAL LOW (ref 6.5–8.1)

## 2018-07-27 LAB — BLOOD GAS, ARTERIAL
Acid-base deficit: 6.5 mmol/L — ABNORMAL HIGH (ref 0.0–2.0)
Bicarbonate: 17.7 mmol/L — ABNORMAL LOW (ref 20.0–28.0)
Drawn by: 295031
O2 Content: 2 L/min
O2 Saturation: 97.1 %
PCO2 ART: 32.3 mmHg (ref 32.0–48.0)
Patient temperature: 98.6
pH, Arterial: 7.359 (ref 7.350–7.450)
pO2, Arterial: 92.7 mmHg (ref 83.0–108.0)

## 2018-07-27 LAB — BASIC METABOLIC PANEL
Anion gap: 16 — ABNORMAL HIGH (ref 5–15)
BUN: 83 mg/dL — ABNORMAL HIGH (ref 8–23)
CO2: 17 mmol/L — ABNORMAL LOW (ref 22–32)
Calcium: 9.3 mg/dL (ref 8.9–10.3)
Chloride: 102 mmol/L (ref 98–111)
Creatinine, Ser: 2.12 mg/dL — ABNORMAL HIGH (ref 0.61–1.24)
GFR calc Af Amer: 34 mL/min — ABNORMAL LOW (ref >60)
GFR calc non Af Amer: 29 mL/min — ABNORMAL LOW (ref >60)
Glucose, Bld: 104 mg/dL — ABNORMAL HIGH (ref 70–99)
Potassium: 3.9 mmol/L (ref 3.5–5.1)
Sodium: 135 mmol/L (ref 135–145)

## 2018-07-27 LAB — CBC
HCT: 30 % — ABNORMAL LOW (ref 39.0–52.0)
Hemoglobin: 8.8 g/dL — ABNORMAL LOW (ref 13.0–17.0)
MCH: 25.2 pg — ABNORMAL LOW (ref 26.0–34.0)
MCHC: 29.3 g/dL — ABNORMAL LOW (ref 30.0–36.0)
MCV: 86 fL (ref 80.0–100.0)
Platelets: 136 10*3/uL — ABNORMAL LOW (ref 150–400)
RBC: 3.49 MIL/uL — ABNORMAL LOW (ref 4.22–5.81)
RDW: 17.4 % — AB (ref 11.5–15.5)
WBC: 25.1 10*3/uL — ABNORMAL HIGH (ref 4.0–10.5)
nRBC: 0.2 % (ref 0.0–0.2)

## 2018-07-27 LAB — AMMONIA: Ammonia: 18 umol/L (ref 9–35)

## 2018-07-27 MED ORDER — SODIUM CHLORIDE 0.9 % IV SOLN
INTRAVENOUS | Status: DC
  Administered 2018-07-27 – 2018-07-28 (×2): via INTRAVENOUS

## 2018-07-27 NOTE — Progress Notes (Signed)
PROGRESS NOTE    Juan [name]  KCL:[id number] DOB: November 03, 1940 DOA: 08/11/2018 PCP: Colon Branch, MD   Brief Van Clines y.o.malewith medical history significant ofhypertension, diet-controlled diabetes, chronic respiratory failure on 2 L oxygen, ascending aortic aneurysm, CAD,dCHF, CKD 3, iron deficiency anemia, OSA not on CPAP, myeloproliferative disease, splenic infarct, hydronephrosis, recurrentpseudomonal UTI who presents with dysuria and back pain.  WBC 26.8, positive urinalysis.  TRH asked to admit.  Admitted for complicated UTI.  Hospital course complicated by lower urinary tract symptoms for which he was started on Pyridium with improvement of his symptoms.  Due to bilateral hydronephrosis and urinary retention a Foley catheter was placed in on 07/25/2018 and Dr. Louis Meckel was called for consultation.  Assessment & Plan:   Principal Problem:   Complicated UTI (urinary tract infection) Active Problems:   Coronary atherosclerosis   CKD (chronic kidney disease) stage 3, GFR 30-59 ml/min (HCC)   Hydronephrosis   Iron deficiency anemia due to chronic blood loss   Hypokalemia   Moderate to severe pulmonary hypertension (HCC)   Chronic respiratory failure with hypoxia (HCC)   Chronic diastolic CHF (congestive heart failure), NYHA class 2 (HCC)   Myelodysplasia (myelodysplastic syndrome) (HCC)   HTN (hypertension)   Normocytic anemia   Back pain  Pseudomonas UTI with lower urinary tract symptoms-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  Reviewed urology notes.  Patient has refused Foley catheter in the past.  He was admitted with hydronephrosis bilaterally which was resolved with placement of Foley catheter.  Bladder scan showed a residual of 401 cc. Urine culture positive for greater than 100,000 colonies of pseudomonas aeruginosa sensitive to cefepime resistant to ciprofloxacin.  Blood culture with no growth.  Continue Pyridium 100 mg 3 times a  day.  Left upper extremity weakness, present on admission Patient reports his left upper extremity was weak prior to coming to the hospital Would not lift his left arm on exam due to weakness Sensory intact Head CT shows mild atrophy and chronic small vessel ischemic changes no acute findings.  AKI on CKD (chronic kidney disease) stage 3, GFR 30-59 ml/min (HCC) was thought to be secondary to post renal secondary to urinary retention with bilateral hydronephrosis: Presented with creatinine of 1.35 which appears to be his baseline with GFR 59 Creatinine today 07/26/2018 is 2.01 from 1.71 yesterday Was on Lasix 80 mg p.o. twice daily at home Hold off Lasix for now Continue to avoid nephrotoxic agents/hypotension Will order BMP today  Resolved intractable nausea and vomiting Suspect secondary to UTI Continue IV Zofran and IV Phenergan for intractable vomiting as needed  Resolved uncontrolled hypertension Continue hydralazine dose to 100 mg 3 times daily Continue to hold Lasix 80 mg twice daily due to AKI Continue to monitor vital signs  Physical debility/ambulatory dysfunction PT to assess Fall precautions Requests wheelchair privilege Patient is from home and will most likely need SNF placement CSW consulted to assist with placement Coronary atherosclerosis:  -No CP -Continue aspirin  Iron deficiency anemia  Hemoglobin stable 9.4 from 10.3 No sign of overt bleeding Continue ferrous sulfate 325 mg p.o. daily  Resolved hypokalemia post repletion   Potassium 3.9 on 07/26/2018 Magnesium 2.4 on 08/25/4494  Chronic diastolic CHFand moderate to severe pulmonary hypertension: 2D echo on 02/07/2018 showed EF of 50-55% with grade 1 diastolic dysfunction. Patient has trace leg edema, no JVT. No worsening shortness of breath. CHF seem to be compensated. -Hold home Lasix 80 mg twice daily due to AKI -Continue aspirin  Mild thrombocytopenia Platelets stable at 129 No sign  of overt bleeding  Chronic respiratory failure with hypoxia ([place]): -Stable. -continue2 L nasal cannula oxygen; which is his baseline. Myelodysplasia (myelodysplastic syndrome) ([place]): Haschronic leukocytosissince at least 2015. WBC 22.8 on 06/10/13--> 22.8 on 07/26/2018.  Back pain:Unclear etiology, may be related to bilateral hydronephrosis. L-spine x-ray did not show acute issues. -PRN Tylenol and Norco for pain    DVT ppx: SQ Heparin 3 times daily Code Status:Full code Family Communication: None at bed side.  Disposition Plan:  Possibly to SNF in the next 24 hours if he remains stable and renal function stable. Consults called:Urology   Estimated body mass index is 22.79 kg/m as calculated from the following:   Height as of this encounter: 6\' 3"  (1.905 m).   Weight as of this encounter: 82.7 kg.    Subjective: Patient resting in bed mumbling to himself hard to follow conversation or to follow commands does have left upper extremity weakness and pain which is old work-up included a CT of his head yesterday which showed no acute changes  Objective: Foley catheter in place with orange-colored urine due to Pyridium Vitals:   07/26/18 0936 07/26/18 1305 07/27/18 0118 07/27/18 0700  BP: 117/62 (!) 136/54 127/60 123/62  Pulse: 83 81 95 (!) 101  Resp:  18 20 20   Temp:  98.3 F (36.8 C) 99.3 F (37.4 C) 99.5 F (37.5 C)  TempSrc:  Oral Oral Axillary  SpO2:  96% 96% 93%  Weight:      Height:        Intake/Output Summary (Last 24 hours) at 07/27/2018 0801 Last data filed at 07/27/2018 9983 Gross per 24 hour  Intake 320 ml  Output 1800 ml  Net -1480 ml   Filed Weights   07/24/18 0000 07/24/18 0155  Weight: 92.5 kg 82.7 kg    Examination:  General exam: Appears calm and comfortable  Respiratory system: Clear to auscultation. Respiratory effort normal. Cardiovascular system: S1 & S2 heard, RRR. No JVD, murmurs, rubs, gallops or clicks. No pedal  edema. Gastrointestinal system: Abdomen is nondistended, soft and nontender. No organomegaly or masses felt. Normal bowel sounds heard. Central nervous system: Alert and oriented. No focal neurological deficits. Extremities: Left upper extremity edema Skin: No rashes, lesions or ulcers Psychiatry: Judgement and insight appear normal. Mood & affect appropriate.     Data Reviewed: I have personally reviewed following labs and imaging studies  CBC: Recent Labs  Lab 08/08/2018 2048 07/24/18 0456 07/25/18 0650 07/26/18 0724  WBC 26.8* 21.1* 30.7* 22.8*  NEUTROABS 11.9*  --   --   --   HGB 10.3* 9.4* 10.3* 9.4*  HCT 35.5* 33.5* 35.7* 32.6*  MCV 86.6 87.9 87.5 86.7  PLT 165 146* 142* 382*   Basic Metabolic Panel: Recent Labs  Lab 08/15/2018 2048 07/24/18 0456 07/25/18 0650 07/26/18 0724  NA 140 141 137 134*  K 3.0* 3.5 3.6 3.9  CL 106 109 104 103  CO2 24 23 22  20*  GLUCOSE 110* 111* 120* 109*  BUN 70* 64* 64* 74*  CREATININE 1.41* 1.35* 1.71* 2.01*  CALCIUM 9.9 9.5 9.7 9.3  MG  --  2.4 2.4  --    GFR: Estimated Creatinine Clearance: 36.6 mL/min (A) (by C-G formula based on SCr of 2.01 mg/dL (H)). Liver Function Tests: Recent Labs  Lab 08/02/2018 2048  AST 23  ALT 19  ALKPHOS 110  BILITOT 0.5  PROT 6.7  ALBUMIN 3.4*   No results for  input(s): LIPASE, AMYLASE in the last 168 hours. No results for input(s): AMMONIA in the last 168 hours. Coagulation Profile: No results for input(s): INR, PROTIME in the last 168 hours. Cardiac Enzymes: No results for input(s): CKTOTAL, CKMB, CKMBINDEX, TROPONINI in the last 168 hours. BNP (last 3 results) No results for input(s): PROBNP in the last 8760 hours. HbA1C: No results for input(s): HGBA1C in the last 72 hours. CBG: No results for input(s): GLUCAP in the last 168 hours. Lipid Profile: No results for input(s): CHOL, HDL, LDLCALC, TRIG, CHOLHDL, LDLDIRECT in the last 72 hours. Thyroid Function Tests: No results for  input(s): TSH, T4TOTAL, FREET4, T3FREE, THYROIDAB in the last 72 hours. Anemia Panel: No results for input(s): VITAMINB12, FOLATE, FERRITIN, TIBC, IRON, RETICCTPCT in the last 72 hours. Sepsis Labs: Recent Labs  Lab 07/28/2018 2048  LATICACIDVEN 0.8    Recent Results (from the past 240 hour(s))  Urine culture     Status: Abnormal   Collection Time: 08/05/2018  8:17 PM  Result Value Ref Range Status   Specimen Description   Final    URINE, RANDOM Performed at [facility] [address], [place], [place] [address]    Special Requests   Final    NONE Performed at [facility], [place] [address] [place], [place] [address]    Culture >=100,000 COLONIES/mL PSEUDOMONAS AERUGINOSA (A)  Final   Report Status 07/26/2018 FINAL  Final   Organism ID, Bacteria PSEUDOMONAS AERUGINOSA (A)  Final      Susceptibility   Pseudomonas aeruginosa - MIC*    CEFTAZIDIME <=1 SENSITIVE Sensitive     CIPROFLOXACIN >=4 RESISTANT Resistant     GENTAMICIN <=1 SENSITIVE Sensitive     IMIPENEM 2 SENSITIVE Sensitive     PIP/TAZO <=4 SENSITIVE Sensitive     CEFEPIME 4 SENSITIVE Sensitive     * >=100,000 COLONIES/mL PSEUDOMONAS AERUGINOSA  Culture, blood (Routine X 2) w Reflex to ID Panel     Status: None (Preliminary result)   Collection Time: 07/24/18 12:59 AM  Result Value Ref Range Status   Specimen Description   Final    BLOOD RIGHT ARM Performed at [facility], [place] [address] [place], [place] [address]    Special Requests   Final    BOTTLES DRAWN AEROBIC AND ANAEROBIC Blood Culture adequate volume Performed at [facility] [address] [place], [place] [address]    Culture   Final    NO GROWTH 3 DAYS Performed at [facility], [place] [address] [place], [place] [address]    Report Status PENDING  Incomplete  Culture, blood (Routine X 2) w Reflex to ID Panel     Status: None (Preliminary result)   Collection Time: 07/24/18 12:59  AM  Result Value Ref Range Status   Specimen Description   Final    BLOOD LEFT HAND Performed at [facility] [address], [place], [place] [address]    Special Requests   Final    BOTTLES DRAWN AEROBIC AND ANAEROBIC Blood Culture adequate volume Performed at [facility] [address] [place], [place] [address]    Culture   Final    NO GROWTH 3 DAYS Performed at [facility], [place] [address] [place], [place] [address]    Report Status PENDING  Incomplete  MRSA PCR Screening     Status: None   Collection Time: 07/24/18  4:49 AM  Result Value Ref  Range Status   MRSA by PCR NEGATIVE NEGATIVE Final    Comment:        The GeneXpert MRSA Assay (FDA approved for NASAL specimens only), is one component of a comprehensive MRSA colonization surveillance program. It is not intended to diagnose MRSA infection nor to guide or monitor treatment for MRSA infections. Performed at [facility], [place] [address] [place], [place] [address]          Radiology Studies: Ct Head Wo Contrast  Result Date: 07/26/2018 CLINICAL DATA:  78 year old male with RIGHT UPPER extremity weakness and generalized muscle weakness. EXAM: CT HEAD WITHOUT CONTRAST TECHNIQUE: Contiguous axial images were obtained from the base of the skull through the vertex without intravenous contrast. COMPARISON:  08/18/2017 and prior CTs FINDINGS: Brain: No evidence of acute infarction, hemorrhage, hydrocephalus, extra-axial collection or mass lesion/mass effect. Mild atrophy and chronic small-vessel white matter ischemic changes noted. Vascular: No hyperdense vessel or unexpected calcification. Skull: Normal. Negative for fracture or focal lesion. Sinuses/Orbits: No acute finding. Other: None. IMPRESSION: 1. No evidence of acute intracranial abnormality 2. Mild atrophy and chronic small-vessel white matter ischemic changes. Electronically Signed   By: Margarette Canada M.D.   On: 07/26/2018 13:46   US Renal  Result Date: 07/26/2018 CLINICAL DATA:  78 year old male for follow-up of hydronephrosis. EXAM: RENAL / URINARY TRACT ULTRASOUND COMPLETE COMPARISON:  08/02/2018 CT FINDINGS: Right Kidney: Renal measurements: 11.1 x 4.9 x 5.9 cm = volume: 165 mL. Increased echogenicity noted. No mass or hydronephrosis visualized. Left Kidney: Renal measurements: 12.4 x 6.6 x 6.7 cm = volume: 288 mL. Increased echogenicity noted. No mass or hydronephrosis visualized. Bladder: A Foley catheter within the bladder is noted. IMPRESSION: 1. Resolution of bilateral hydronephrosis since 08/12/2018. 2. Increased renal echogenicity within both kidneys compatible with medical renal disease. 3. Foley catheter within the bladder. Electronically Signed   By: Margarette Canada M.D.   On: 07/26/2018 21:01        Scheduled Meds: . aspirin EC  81 mg Oral Daily  . B-complex with vitamin C  1 tablet Oral Daily  . clotrimazole  1 application Topical BID  . ferrous gluconate  324 mg Oral Daily  . heparin  5,000 Units Subcutaneous Q8H  . hydrALAZINE  100 mg Oral TID  . hydrocortisone cream  1 application Topical BID  . mouth rinse  15 mL Mouth Rinse BID  . tamsulosin  0.4 mg Oral Daily   Continuous Infusions: . sodium chloride Stopped (07/24/18 1137)  . ceFEPime (MAXIPIME) IV Stopped (07/26/18 2301)     LOS: 3 days     Georgette Shell, MD [facility] 7PM-7AM, please contact night-coverage [email or web address] Password [email or web address] 07/27/2018, 8:01 AM

## 2018-07-27 NOTE — Progress Notes (Signed)
No change in pt from change of shift report of pt being very lethargic and not responding to verbal commands. Will continue to monitor

## 2018-07-27 NOTE — TOC Initial Note (Signed)
Transition of Care [facility]) - Initial/Assessment Note    Patient Details  Name: Juan [name] MRN: [id number] Date of Birth: 1940/08/19  Transition of Care [facility]) CM/SW Contact:    Purcell Mouton, RN Phone Number: 07/27/2018, 11:24 AM  Clinical Narrative:     Pt's son Juan [name] states that pt will go to SNF at discharge and asked for [facility].     78 y.o.malewith medical history significant ofhypertension, diet-controlled diabetes, chronic respiratory failure on 2 L oxygen, ascending aortic aneurysm, CAD,dCHF, CKD 3, iron deficiency anemia, OSA not on CPAP, myeloproliferative disease, splenic infarct, hydronephrosis, recurrentpseudomonal UTI who presents with dysuria and back pain.        Expected Discharge Plan: [place]     Patient Goals and CMS Choice Pt's son Juan [name] states that pt will go to SNF at discharge and asked for [facility].       Choice offered to / list presented to : Adult Children  Expected Discharge Plan and Services Expected Discharge Plan: [place] Discharge Planning Services: CM Consult Post Acute Care Choice: [place]   Expected discharge date: 07/28/2018                        Prior Living Arrangements/Services  Home with Children                     Activities of Daily Living Home Assistive Devices/Equipment: Kasandra Knudsen (specify quad or straight), Shower chair with back(straight cane) ADL Screening (condition at time of admission) Patient's cognitive ability adequate to safely complete daily activities?: Yes Is the patient deaf or have difficulty hearing?: No Does the patient have difficulty seeing, even when wearing glasses/contacts?: No Does the patient have difficulty concentrating, remembering, or making decisions?: No Patient able to express need for assistance with ADLs?: Yes Does the patient have difficulty dressing or bathing?: Yes Independently performs ADLs?: No Communication:  Independent Dressing (OT): Needs assistance Is this a change from baseline?: Pre-admission baseline Grooming: Needs assistance Is this a change from baseline?: Pre-admission baseline Feeding: Independent Bathing: Needs assistance Is this a change from baseline?: Pre-admission baseline Toileting: Needs assistance Is this a change from baseline?: Pre-admission baseline In/Out Bed: Needs assistance Is this a change from baseline?: Pre-admission baseline Walks in Home: Needs assistance Is this a change from baseline?: Pre-admission baseline Does the patient have difficulty walking or climbing stairs?: Yes Weakness of Legs: Both Weakness of Arms/Hands: None  Permission Sought/Granted                  Emotional Assessment              Admission diagnosis:  Bilateral hydronephrosis [V61.60] Complicated UTI (urinary tract infection) [N39.0] Patient Active Problem List   Diagnosis Date Noted  . Complicated UTI (urinary tract infection) 07/24/2018  . Back pain 07/24/2018  . Normocytic anemia 06/10/2018  . Thrombocytopenia ([place]) 06/10/2018  . Recurrent UTI 06/10/2018  . Venous insufficiency of lower extremity 06/10/2018  . Goals of care, counseling/discussion 03/17/2018  . ARF (acute renal failure) ([place]) 09/24/2017  . Staphylococcus epidermidis infection   . Acute encephalopathy 08/18/2017  . Sleep apnea, obstructive 08/18/2017  . 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 Moderate to severe pulmonary hypertension ([place]) 08/18/2017  . Chronic respiratory failure with hypoxia ([place]) 08/18/2017  . Chronic diastolic CHF (congestive heart failure), NYHA class 2 ([place]) 08/18/2017  . Myelodysplasia (myelodysplastic syndrome) ([place]) 08/18/2017  . HTN (hypertension) 08/18/2017  .  Pyelonephritis 07/17/2017  . Hypokalemia   . Iron deficiency anemia due to chronic blood loss 03/31/2017  . 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 Urinary retention 01/24/2017  . Hypertrophy of prostate with  urinary retention 12/24/2016  . Elevated troponin 11/14/2016  . Recurrent pneumonia 10/31/2016  . Sprain of left rotator cuff capsule   . Hyperkalemia   . Paroxysmal SVT (supraventricular tachycardia) ([place]) 04/20/2016  . Type II diabetes mellitus ([place]) 04/20/2016  . Gout 04/20/2016  . B12 deficiency 04/20/2016  . 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 Hydronephrosis   . Thrombocytosis ([place]) 04/07/2016  . T wave inversion in EKG 12/29/2015  . PCP NOTES >>>>>>>>>>>>>>>>> 05/02/2015  . Peripheral edema   . Thoracic aortic aneurysm ([place]) 04/26/2014  . CKD (chronic kidney disease) stage 3, GFR 30-59 ml/min (HCC) 04/26/2014  . Generalized weakness 04/06/2014  . Venous stasis dermatitis of both lower extremities   . Morbid obesity due to excess calories ([place]) 03/29/2014  . 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 Leukocytosis 10/17/2013  . Poor compliance with advise  05/05/2012  . Annual physical exam 01/14/2012  . Weight loss 01/14/2012  . BPH (benign prostatic hyperplasia) 01/14/2012  . 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 Obstructive sleep apnea 09/07/2006  . Coronary atherosclerosis 09/07/2006  . Allergic rhinitis 09/07/2006   PCP:  Colon Branch, MD Pharmacy:   [place], [place] - [address] [facility]. [address] [place] [address] Phone: [phone] Fax: [place], [place]. [place]. [place] [address] Phone: [phone] Fax: [phone]     Social Determinants of Health (SDOH) Interventions    Readmission Risk Interventions 30 Day Unplanned Readmission Risk Score     ED to Hosp-Admission (Current) from 08/08/2018 in [facility]  30 Day Unplanned Readmission Risk Score (%)  30 Filed at 07/27/2018 0801     This score is the patient's risk of an unplanned readmission within 30 days of  being discharged (0 -100%). The score is based on dignosis, age, lab data, medications, orders, and past utilization.   Low:  0-14.9   Medium: 15-21.9   High: 22-29.9   Extreme: 30 and above       No flowsheet data found.

## 2018-07-27 NOTE — Progress Notes (Signed)
Spoke with Dr. Rodena Piety in regards to his MEWS score going up. He is still very lethargic and not responding to commands. He has refused to eat or drink today so she gave me an order for Normal Saline @ 19ml/hr. Will continue to monitor his vital signs and mentation.

## 2018-07-27 NOTE — Progress Notes (Signed)
Pharmacy Antibiotic Note  Juan [name] is a 78 y.o. male with generalized aching back pain admitted on 08/03/2018 with UTI.  Pharmacy has been consulted for cefepime dosing. Hx of pseudomonal UTI.  07/27/2018  D# 4 Cefepime. WBC 22.8 on 3/10. Scr 2.01 3/10.  AF. Ucx w/ Pseudomonas R to cipro, sensitive to all others tested.  B hydronephrosis resolved after foley placement.  Urology recs chronic indwelling catheter.   Plan: Continue Cefepime 2 GM IV q12h Could complete course with Fosfomycin 3 gm q72 hrs if needed - per Urology notes pt can't afford abxs,  ? Continue IV abx in SNF vs giving Fosfomycin 3 gm prior to DC to extend abx another 72 hours  Height: 6\' 3"  (190.5 cm) Weight: 182 lb 5.1 oz (82.7 kg) IBW/kg (Calculated) : 84.5  Temp (24hrs), Avg:99 F (37.2 C), Min:98.3 F (36.8 C), Max:99.5 F (37.5 C)  Recent Labs  Lab 07/22/2018 2048 07/24/18 0456 07/25/18 0650 07/26/18 0724  WBC 26.8* 21.1* 30.7* 22.8*  CREATININE 1.41* 1.35* 1.71* 2.01*  LATICACIDVEN 0.8  --   --   --     Estimated Creatinine Clearance: 36.6 mL/min (A) (by C-G formula based on SCr of 2.01 mg/dL (H)).    No Known Allergies Antimicrobials this admission: 3/7 rocephin >> x1 ED 3/8 cefepime >>  Dose adjustments this admission:  Microbiology results: 3/8 BCx: ngtd 3/7 UCx: >100k pseudomonas aeruginosa> R cipro, sens to all others 3/8 MRSA PCR: neg  Thank you for allowing pharmacy to be a part of this patient's care.  Eudelia Bunch, Pharm.D [phone] 07/27/2018 7:52 AM

## 2018-07-27 NOTE — NC FL2 (Signed)
[place] LEVEL OF CARE SCREENING TOOL     IDENTIFICATION  Patient Name: Juan [name] Birthdate: February 24, 1941 Sex: male Admission Date (Current Location): 07/17/2018  [facility] [place] Number:  Herbalist and Address:  [facility],  [place], [place]      Provider Number: [id number]  Attending Physician Name and Address:  Georgette Shell, MD  Relative Name and Phone Number:       Current Level of Care: Hospital Recommended Level of Care: [place] Prior Approval Number:    Date Approved/Denied:   PASRR Number: [id number]  Discharge Plan: SNF    Current Diagnoses: Patient Active Problem List   Diagnosis Date Noted  . Complicated UTI (urinary tract infection) 07/24/2018  . Back pain 07/24/2018  . Normocytic anemia 06/10/2018  . Thrombocytopenia ([place]) 06/10/2018  . Recurrent UTI 06/10/2018  . Venous insufficiency of lower extremity 06/10/2018  . Goals of care, counseling/discussion 03/17/2018  . ARF (acute renal failure) ([place]) 09/24/2017  . Staphylococcus epidermidis infection   . Acute encephalopathy 08/18/2017  . Sleep apnea, obstructive 08/18/2017  . 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 Moderate to severe pulmonary hypertension ([place]) 08/18/2017  . Chronic respiratory failure with hypoxia ([place]) 08/18/2017  . Chronic diastolic CHF (congestive heart failure), NYHA class 2 ([place]) 08/18/2017  . Myelodysplasia (myelodysplastic syndrome) ([place]) 08/18/2017  . HTN (hypertension) 08/18/2017  . Pyelonephritis 07/17/2017  . Hypokalemia   . Iron deficiency anemia due to chronic blood loss 03/31/2017  . 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 Urinary retention 01/24/2017  . 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 Elevated troponin 11/14/2016  . Recurrent pneumonia 10/31/2016  . Sprain of left rotator cuff capsule   . Hyperkalemia   . Paroxysmal SVT (supraventricular tachycardia) ([place])  04/20/2016  . Type II diabetes mellitus ([place]) 04/20/2016  . Gout 04/20/2016  . B12 deficiency 04/20/2016  . Neuropathy due to secondary diabetes ([facility]) 04/20/2016  . Hydronephrosis   . Thrombocytosis ([place]) 04/07/2016  . T wave inversion in EKG 12/29/2015  . PCP NOTES >>>>>>>>>>>>>>>>> 05/02/2015  . Peripheral edema   . Thoracic aortic aneurysm ([place]) 04/26/2014  . CKD (chronic kidney disease) stage 3, GFR 30-59 ml/min (HCC) 04/26/2014  . Generalized weakness 04/06/2014  . Venous stasis dermatitis of both lower extremities   . Morbid obesity due to excess calories ([place]) 03/29/2014  . 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 Leukocytosis 10/17/2013  . Poor compliance with advise  05/05/2012  . Annual physical exam 01/14/2012  . Weight loss 01/14/2012  . BPH (benign prostatic hyperplasia) 01/14/2012  . Myelofibrosis ([place]) 09/07/2006  . Obstructive sleep apnea 09/07/2006  . Coronary atherosclerosis 09/07/2006  . Allergic rhinitis 09/07/2006    Orientation RESPIRATION BLADDER Height & Weight     Self, Place  O2(2L) Indwelling catheter Weight: 182 lb 5.1 oz (82.7 kg) Height:  6\' 3"  (190.5 cm)  BEHAVIORAL SYMPTOMS/MOOD NEUROLOGICAL BOWEL NUTRITION STATUS      Continent Diet(heart healthy diet)  AMBULATORY STATUS COMMUNICATION OF NEEDS Skin   Extensive Assist Verbally Normal                       Personal Care Assistance Level of Assistance  Bathing, Feeding, Dressing Bathing Assistance: Limited assistance Feeding assistance: Independent Dressing Assistance: Limited assistance     Functional Limitations Info  Sight, Hearing, Speech Sight Info: Adequate Hearing Info: Adequate Speech Info: Adequate    SPECIAL CARE FACTORS  FREQUENCY  PT (By licensed PT), OT (By licensed OT)     PT Frequency: 5x OT Frequency: 5x            Contractures Contractures Info: Not present    Additional Factors Info  Code Status, Allergies Code Status Info: full  code Allergies Info: nka           Current Medications (07/27/2018):  This is the current hospital active medication list Current Facility-Administered Medications  Medication Dose Route Frequency Provider Last Rate Last Dose  . 0.9 %  sodium chloride infusion   Intravenous PRN Irene Pap N, DO   Stopped at 07/24/18 1137  . acetaminophen (TYLENOL) tablet 650 mg  650 mg Oral Q6H PRN Ivor Costa, MD   650 mg at 07/24/18 1539   Or  . acetaminophen (TYLENOL) suppository 650 mg  650 mg Rectal Q6H PRN Ivor Costa, MD      . albuterol (PROVENTIL) (2.5 MG/3ML) 0.083% nebulizer solution 2.5 mg  2.5 mg Nebulization Q6H PRN Ivor Costa, MD      . B-complex with vitamin C tablet 1 tablet  1 tablet Oral Daily Ivor Costa, MD   1 tablet at 07/26/18 0930  . ceFEPIme (MAXIPIME) 2 g in sodium chloride 0.9 % 100 mL IVPB  2 g Intravenous BID Dorrene German, RPH 200 mL/hr at 07/27/18 1051 2 g at 07/27/18 1051  . clotrimazole (LOTRIMIN) 1 % cream 1 application  1 application Topical BID Ivor Costa, MD   1 application at 17/51/02 1054  . diclofenac sodium (VOLTAREN) 1 % transdermal gel 4 g  4 g Topical TID PRN Irene Pap N, DO   4 g at 07/27/18 1054  . heparin injection 5,000 Units  5,000 Units Subcutaneous Q8H Ivor Costa, MD   5,000 Units at 07/27/18 0455  . hydrALAZINE (APRESOLINE) injection 5 mg  5 mg Intravenous Q2H PRN Ivor Costa, MD      . hydrocortisone cream 1 % 1 application  1 application Topical BID Ivor Costa, MD   1 application at 58/52/77 1054  . MEDLINE mouth rinse  15 mL Mouth Rinse BID Ivor Costa, MD   15 mL at 07/27/18 1058  . ondansetron (ZOFRAN) tablet 4 mg  4 mg Oral Q6H PRN Ivor Costa, MD       Or  . ondansetron [facility]) injection 4 mg  4 mg Intravenous Q6H PRN Ivor Costa, MD   4 mg at 07/24/18 2202  . promethazine (PHENERGAN) injection 6.25 mg  6.25 mg Intravenous Q8H PRN Hall, Carole N, DO      . senna-docusate (Senokot-S) tablet 1 tablet  1 tablet Oral QHS PRN Ivor Costa, MD          Discharge Medications: Please see discharge summary for a list of discharge medications.  Relevant Imaging Results:  Relevant Lab Results:   Additional Information SSN: [id number]  Nila Nephew, LCSW

## 2018-07-27 NOTE — Progress Notes (Signed)
PT Cancellation Note  Patient Details Name: Juan [name] MRN: [id number] DOB: Sep 24, 1940   Cancelled Treatment:    Reason Eval/Treat Not Completed: Medical issues which prohibited therapy;Fatigue/lethargy limiting ability to participate. Will check back as schedule allows.    Weston Anna, PT Acute Rehabilitation Services Pager: [phone] Office: [phone]

## 2018-07-27 NOTE — Progress Notes (Signed)
PT Cancellation Note  Patient Details Name: Jamori Biggar MRN: [id number] DOB: 1940-10-12   Cancelled Treatment:    Reason Eval/Treat Not Completed: Medical issues which prohibited therapy. Attempted PT eval a 2nd time today. Pt not really responsive to therapist's voice, tactile stimuli. Not currently appropriate for PT. Will check back another day.    Weston Anna, PT Acute Rehabilitation Services Pager: [phone] Office: [phone]

## 2018-07-27 NOTE — Consult Note (Signed)
I have been asked to see the patient by Dr. Francia Greaves, for evaluation and management of chronic retention/recurrent UTIs.  History of present illness: Juan [name] is a 78 year old male well-known to me whom I have seen several times over the past several years for recurrent urinary tract infections and chronic urinary retention.  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  He has had extensive work-up which demonstrated a poorly functioning bladder with prostate enlargement and mild to moderate obstruction.  The patient has been hospitalized numerous times over the past several years for the same problem.  I have had trouble treating his Pseudomonas infections because he cannot afford the medications required to adequately treated.  He also has been adamant that he does not want to live with a Foley catheter, but continues to develop bilateral hydronephrosis and these recurrent infections because he is not emptying his bladder very well.  He has missed his last several appointments, stating that he has no one to get him to the appointments and no real social support network at home.  The patient was admitted this time with generalized ache and low back pain.  He was noted to have urinary tract infection and elevated white blood cell count.  A CT scan was repeated and he had bilateral hydronephrosis.  A Foley catheter was then placed and repeat ultrasound demonstrated resolution of his hydronephrosis.  Today, I interviewed the patient he was intermittently awake and conversant.  He seemed very somnolent and disengaged.  I was unable to obtain any meaningful history from him.  Review of systems: A 12 point comprehensive review of systems was obtained and is negative unless otherwise stated in the history of present illness.  Patient Active Problem List   Diagnosis Date Noted  . Complicated UTI (urinary  tract infection) 07/24/2018  . Back pain 07/24/2018  . Normocytic anemia 06/10/2018  . Thrombocytopenia ([place]) 06/10/2018  . Recurrent UTI 06/10/2018  . Venous insufficiency of lower extremity 06/10/2018  . Goals of care, counseling/discussion 03/17/2018  . ARF (acute renal failure) ([place]) 09/24/2017  . Staphylococcus epidermidis infection   . Acute encephalopathy 08/18/2017  . Sleep apnea, obstructive 08/18/2017  . 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 Moderate to severe pulmonary hypertension ([place]) 08/18/2017  . Chronic respiratory failure with hypoxia ([place]) 08/18/2017  . Chronic diastolic CHF (congestive heart failure), NYHA class 2 ([place]) 08/18/2017  . Myelodysplasia (myelodysplastic syndrome) ([place]) 08/18/2017  . HTN (hypertension) 08/18/2017  . Pyelonephritis 07/17/2017  . Hypokalemia   . Iron deficiency anemia due to chronic blood loss 03/31/2017  . 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 Urinary retention 01/24/2017  . 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 Elevated troponin 11/14/2016  . Recurrent pneumonia 10/31/2016  . Sprain of left rotator cuff capsule   . Hyperkalemia   . Paroxysmal SVT (supraventricular tachycardia) ([place]) 04/20/2016  . Type II diabetes mellitus ([place]) 04/20/2016  . Gout 04/20/2016  . B12 deficiency 04/20/2016  . Neuropathy due to secondary diabetes ([place]) 04/20/2016  . Hydronephrosis   . Thrombocytosis ([place]) 04/07/2016  . T wave inversion in EKG 12/29/2015  . PCP NOTES >>>>>>>>>>>>>>>>> 05/02/2015  . Peripheral edema   . Thoracic aortic aneurysm ([place]) 04/26/2014  . CKD (chronic kidney disease) stage 3, GFR 30-59 ml/min (HCC) 04/26/2014  . Generalized weakness 04/06/2014  . Venous stasis dermatitis of both lower extremities   . Morbid obesity due to excess calories ([place]) 03/29/2014  .  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 Leukocytosis 10/17/2013  . Poor compliance with advise  05/05/2012  . Annual physical exam  01/14/2012  . Weight loss 01/14/2012  . BPH (benign prostatic hyperplasia) 01/14/2012  . 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 Obstructive sleep apnea 09/07/2006  . Coronary atherosclerosis 09/07/2006  . Allergic rhinitis 09/07/2006    No current facility-administered medications on file prior to encounter.    Current Outpatient Medications on File Prior to Encounter  Medication Sig Dispense Refill  . acetaminophen (TYLENOL) 325 MG tablet Take 2 tablets (650 mg total) by mouth every 6 (six) hours as needed for mild pain (or Fever >/= 101).    Marland Kitchen aspirin EC 81 MG tablet Take 1 tablet (81 mg total) by mouth daily.    Marland Kitchen b complex vitamins tablet Take 1 tablet by mouth daily.    . clotrimazole (LOTRIMIN AF) 1 % cream Apply to Lt foot (between) Toes bid for athletes foot (Patient taking differently: Apply 1 application topically 2 (two) times daily. Apply to left foot (between) toes twice daily for athletes foot.) 28 g 0  . ferrous gluconate (FERGON) 240 (27 FE) MG tablet Take 240 mg by mouth daily.    . furosemide (LASIX) 80 MG tablet Take 1 tablet (80 mg total) by mouth 2 (two) times daily. 180 tablet 3  . hydrALAZINE (APRESOLINE) 50 MG tablet Take 1.5 tablets (75 mg total) by mouth 3 (three) times daily. 135 tablet 2  . hydrocortisone cream 1 % Apply 1 application topically 2 (two) times daily.    Marland Kitchen mouth rinse LIQD solution 15 mLs by Mouth Rinse route 2 (two) times daily. 118 mL 0  . benzonatate (TESSALON) 100 MG capsule Take 1 capsule (100 mg total) by mouth 3 (three) times daily as needed for cough. 30 capsule 0  . miconazole (MICOTIN) 2 % powder Apply 1 application topically as needed for itching.     . ondansetron (ZOFRAN) 4 MG tablet Take 1 tablet (4 mg total) by mouth every 6 (six) hours as needed for nausea or vomiting. (Patient not taking: Reported on 07/19/2018) 10 tablet 0  . OXYGEN Inhale 2 L into the lungs continuous.    . tamsulosin (FLOMAX) 0.4 MG CAPS capsule Take 1 capsule (0.4 mg  total) by mouth daily after supper. (Patient taking differently: Take 0.4 mg by mouth daily as needed (urination). ) 30 capsule 0    Past Medical History:  Diagnosis Date  . Allergic rhinitis   . Anemia   . Ascending aortic aneurysm ([place]) 03/2014   4.3cm on CT scan  . CAD (coronary artery disease)    dx elsewheer in past, no documentation. Non-ischemic myoview 2007  . Chronic diastolic CHF (congestive heart failure), NYHA class 2 (HCC)    Normal EF w/ grade 1 dd by echo 12/2015  . Chronic respiratory failure with hypoxia/2L at home    "2L; all the time" (05/03/2017)  . CKD (chronic kidney disease), stage III ([place])   . Edema    R>L leg, u/s 5-12 neg for DVT  . Hemorrhoid   . History of hydronephrosis   . History of Splenic infarct   . History of thrombocytosis   . Hypertension   . Iron deficiency anemia due to chronic blood loss 03/31/2017  . 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 Migraine    "once/wk at least" (07/11/2013)  . Moderate to severe pulmonary hypertension ([place])   . Myeloproliferative disease ([place])   . Pyelonephritis 04/29/2017  .  Recurrent Pseudomonas urinary tract infection   . Shortness of breath   . Sinus congestion   . Sleep apnea, obstructive    at some point used CPAP, was d/c  years ago  . Type II diabetes mellitus ([place])   . UTI (urinary tract infection) 06/2017  . UTI (urinary tract infection) 07/2017    Past Surgical History:  Procedure Laterality Date  . FLEXIBLE SIGMOIDOSCOPY N/A 06/08/2017   Procedure: FLEXIBLE SIGMOIDOSCOPY;  Surgeon: Carol Ada, MD;  Location: [facility];  Service: Endoscopy;  Laterality: N/A;  . IR FLUORO GUIDE CV LINE RIGHT  05/07/2017  . IR FLUORO RM 30-60 MIN  05/07/2017  . IR US GUIDE VASC ACCESS RIGHT  05/07/2017  . TOE SURGERY Right    "tried to straighten out big toe" (07/11/2013)    Social History   Tobacco Use  . Smoking status: Former Smoker    Packs/day: 0.25    Years: 12.00    Pack years: 3.00    Types:  Cigarettes    Last attempt to quit: 05/18/1966    Years since quitting: 52.2  . Smokeless tobacco: Never Used  . Tobacco comment: quit smoking 45 years ago  Substance Use Topics  . Alcohol use: No    Alcohol/week: 0.0 standard drinks  . Drug use: No    Family History  Problem Relation Age of Onset  . Schizophrenia Son   . Mental illness Daughter   . Colon cancer Neg Hx   . Prostate cancer Neg Hx   . Heart attack Neg Hx   . Diabetes Neg Hx     PE: Vitals:   07/25/18 2144 07/26/18 0936 07/26/18 1305 07/27/18 0118  BP: (!) 128/57 117/62 (!) 136/54 127/60  Pulse: 92 83 81 95  Resp: 16  18 20   Temp: 99.3 F (37.4 C)  98.3 F (36.8 C) 99.3 F (37.4 C)  TempSrc: Oral  Oral Oral  SpO2: 96%  96% 96%  Weight:      Height:       Patient appears to be in no acute distress-somnolent patient is alert and oriented Atraumatic normocephalic head No cervical or supraclavicular lymphadenopathy appreciated No increased work of breathing, no audible wheezes/rhonchi -baseline nasal cannula Regular sinus rhythm/rate Abdomen is soft, nontender, nondistended Lower extremities are symmetric without appreciable edema Foley catheter is draining blood-tinged urine (pink) Grossly neurologically intact No identifiable skin lesions  Recent Labs    07/25/18 0650 07/26/18 0724  WBC 30.7* 22.8*  HGB 10.3* 9.4*  HCT 35.7* 32.6*   Recent Labs    07/25/18 0650 07/26/18 0724  NA 137 134*  K 3.6 3.9  CL 104 103  CO2 22 20*  GLUCOSE 120* 109*  BUN 64* 74*  CREATININE 1.71* 2.01*  CALCIUM 9.7 9.3   No results for input(s): LABPT, INR in the last 72 hours. No results for input(s): LABURIN in the last 72 hours. Results for orders placed or performed during the hospital encounter of 08/09/2018  Urine culture     Status: Abnormal   Collection Time: 07/24/2018  8:17 PM  Result Value Ref Range Status   Specimen Description   Final    URINE, RANDOM Performed at [facility] [address], [place], [place] [address]    Special Requests   Final    NONE Performed at [facility], [place] [address] [place], [place] [address]    Culture >=100,000 COLONIES/mL PSEUDOMONAS AERUGINOSA (A)  Final   Report  Status 07/26/2018 FINAL  Final   Organism ID, Bacteria PSEUDOMONAS AERUGINOSA (A)  Final      Susceptibility   Pseudomonas aeruginosa - MIC*    CEFTAZIDIME <=1 SENSITIVE Sensitive     CIPROFLOXACIN >=4 RESISTANT Resistant     GENTAMICIN <=1 SENSITIVE Sensitive     IMIPENEM 2 SENSITIVE Sensitive     PIP/TAZO <=4 SENSITIVE Sensitive     CEFEPIME 4 SENSITIVE Sensitive     * >=100,000 COLONIES/mL PSEUDOMONAS AERUGINOSA  Culture, blood (Routine X 2) w Reflex to ID Panel     Status: None (Preliminary result)   Collection Time: 07/24/18 12:59 AM  Result Value Ref Range Status   Specimen Description   Final    BLOOD RIGHT ARM Performed at [facility], [address], [place], [place] [address]    Special Requests   Final    BOTTLES DRAWN AEROBIC AND ANAEROBIC Blood Culture adequate volume Performed at [facility] [address] [place], [place] [address]    Culture   Final    NO GROWTH 2 DAYS Performed at [facility] [address] [place], [place] [address]    Report Status PENDING  Incomplete  Culture, blood (Routine X 2) w Reflex to ID Panel     Status: None (Preliminary result)   Collection Time: 07/24/18 12:59 AM  Result Value Ref Range Status   Specimen Description   Final    BLOOD LEFT HAND Performed at [facility] [address] [place], [place] [address]    Special Requests   Final    BOTTLES DRAWN AEROBIC AND ANAEROBIC Blood Culture adequate volume Performed at [facility] [address] [place], [place] [address]    Culture   Final    NO GROWTH 2 DAYS Performed at [facility] [address], [place], [place] [address]    Report Status  PENDING  Incomplete  MRSA PCR Screening     Status: None   Collection Time: 07/24/18  4:49 AM  Result Value Ref Range Status   MRSA by PCR NEGATIVE NEGATIVE Final    Comment:        The GeneXpert MRSA Assay (FDA approved for NASAL specimens only), is one component of a comprehensive MRSA colonization surveillance program. It is not intended to diagnose MRSA infection nor to guide or monitor treatment for MRSA infections. Performed at [facility], [place] [address], [place], [place] [address]     Imaging: I have independently reviewed both the CT scan and the patient's ultrasound which demonstrated a thickened bladder with bilateral hydro-initially with resolution of hydronephrosis after Foley catheter is seen on the ultrasound.  Imp/rec: The patient has a poorly functioning bladder and BPH with obstruction which prevents him from emptying his bladder well.  He also has poor fluid intake and overall frailty with a history of multidrug-resistant Pseudomonas urinary tract infections.  He is very noncompliant, and has been difficult to manage.  We have tried managing him without a Foley catheter on his request.  The last office visit (one year ago) the patient and I had a real heart-to-heart and I told him that he had 1 more opportunity to void on his own prove that he could do this without being readmitted to the hospital for situations such as this.  Clearly he has failed that.  As such, I would recommend that the patient have a chronic indwelling Foley catheter with  home health to exchange his catheter is on a monthly basis.  His living situation is unknown to me, but always seemed very questionable.  Perhaps placement in the long-term care facility would be beneficial for him.  Thank you for involving me in this patient's care, Please page with any further questions or concerns. Ardis Hughs

## 2018-07-28 ENCOUNTER — Encounter (HOSPITAL_COMMUNITY): Payer: Self-pay | Admitting: Radiology

## 2018-07-28 ENCOUNTER — Inpatient Hospital Stay (HOSPITAL_COMMUNITY): Payer: Medicare Other

## 2018-07-28 ENCOUNTER — Inpatient Hospital Stay (HOSPITAL_COMMUNITY)
Admit: 2018-07-28 | Discharge: 2018-07-28 | Disposition: A | Discharge status: Home / Self Care | Payer: Medicare Other | Attending: Internal Medicine | Admitting: Internal Medicine

## 2018-07-28 ENCOUNTER — Other Ambulatory Visit: Payer: Self-pay

## 2018-07-28 LAB — COMPREHENSIVE METABOLIC PANEL
ALBUMIN: 2.5 g/dL — AB (ref 3.5–5.0)
ALT: 14 U/L (ref 0–44)
ALT: 13 U/L (ref 0–44)
AST: 26 U/L (ref 15–41)
AST: 26 U/L (ref 15–41)
Albumin: 2.4 g/dL — ABNORMAL LOW (ref 3.5–5.0)
Alkaline Phosphatase: 81 U/L (ref 38–126)
Alkaline Phosphatase: 90 U/L (ref 38–126)
Anion gap: 14 (ref 5–15)
Anion gap: 12 (ref 5–15)
BUN: 89 mg/dL — ABNORMAL HIGH (ref 8–23)
BUN: 87 mg/dL — ABNORMAL HIGH (ref 8–23)
CALCIUM: 9.2 mg/dL (ref 8.9–10.3)
CO2: 17 mmol/L — ABNORMAL LOW (ref 22–32)
CO2: 19 mmol/L — ABNORMAL LOW (ref 22–32)
CREATININE: 2.02 mg/dL — AB (ref 0.61–1.24)
CREATININE: 1.98 mg/dL — AB (ref 0.61–1.24)
Calcium: 9.5 mg/dL (ref 8.9–10.3)
Chloride: 110 mmol/L (ref 98–111)
Chloride: 113 mmol/L — ABNORMAL HIGH (ref 98–111)
GFR calc Af Amer: 36 mL/min — ABNORMAL LOW (ref >60)
GFR calc Af Amer: 37 mL/min — ABNORMAL LOW (ref >60)
GFR calc non Af Amer: 31 mL/min — ABNORMAL LOW (ref >60)
GFR calc non Af Amer: 32 mL/min — ABNORMAL LOW (ref >60)
GLUCOSE: 97 mg/dL (ref 70–99)
Glucose, Bld: 103 mg/dL — ABNORMAL HIGH (ref 70–99)
Potassium: 4 mmol/L (ref 3.5–5.1)
Potassium: 3.8 mmol/L (ref 3.5–5.1)
Sodium: 141 mmol/L (ref 135–145)
Sodium: 144 mmol/L (ref 135–145)
Total Bilirubin: 0.8 mg/dL (ref 0.3–1.2)
Total Bilirubin: 0.9 mg/dL (ref 0.3–1.2)
Total Protein: 6.2 g/dL — ABNORMAL LOW (ref 6.5–8.1)
Total Protein: 6.2 g/dL — ABNORMAL LOW (ref 6.5–8.1)

## 2018-07-28 LAB — BLOOD GAS, ARTERIAL
ACID-BASE DEFICIT: 9.1 mmol/L — AB (ref 0.0–2.0)
Bicarbonate: 17.3 mmol/L — ABNORMAL LOW (ref 20.0–28.0)
Drawn by: 103701
FIO2: 100
O2 SAT: 98.7 %
Patient temperature: 98.6
pCO2 arterial: 42 mmHg (ref 32.0–48.0)
pH, Arterial: 7.239 — ABNORMAL LOW (ref 7.350–7.450)
pO2, Arterial: 190 mmHg — ABNORMAL HIGH (ref 83.0–108.0)

## 2018-07-28 LAB — CBC
HCT: 29 % — ABNORMAL LOW (ref 39.0–52.0)
HCT: 32.2 % — ABNORMAL LOW (ref 39.0–52.0)
HEMOGLOBIN: 8.9 g/dL — AB (ref 13.0–17.0)
Hemoglobin: 8.4 g/dL — ABNORMAL LOW (ref 13.0–17.0)
MCH: 24.9 pg — AB (ref 26.0–34.0)
MCH: 24.2 pg — ABNORMAL LOW (ref 26.0–34.0)
MCHC: 29 g/dL — ABNORMAL LOW (ref 30.0–36.0)
MCHC: 27.6 g/dL — ABNORMAL LOW (ref 30.0–36.0)
MCV: 86.1 fL (ref 80.0–100.0)
MCV: 87.5 fL (ref 80.0–100.0)
Platelets: 141 10*3/uL — ABNORMAL LOW (ref 150–400)
Platelets: 156 10*3/uL (ref 150–400)
RBC: 3.37 MIL/uL — ABNORMAL LOW (ref 4.22–5.81)
RBC: 3.68 MIL/uL — AB (ref 4.22–5.81)
RDW: 17.8 % — ABNORMAL HIGH (ref 11.5–15.5)
RDW: 17.5 % — ABNORMAL HIGH (ref 11.5–15.5)
WBC: 27.9 10*3/uL — ABNORMAL HIGH (ref 4.0–10.5)
WBC: 32.3 10*3/uL — ABNORMAL HIGH (ref 4.0–10.5)
nRBC: 0.3 % — ABNORMAL HIGH (ref 0.0–0.2)
nRBC: 0.7 % — ABNORMAL HIGH (ref 0.0–0.2)

## 2018-07-28 LAB — GLUCOSE, CSF: Glucose, CSF: 68 mg/dL (ref 40–70)

## 2018-07-28 LAB — CSF CELL COUNT WITH DIFFERENTIAL
RBC Count, CSF: 2 /mm3 — ABNORMAL HIGH
Tube #: 4
WBC, CSF: 2 /mm3 (ref 0–5)

## 2018-07-28 LAB — MRSA PCR SCREENING: MRSA by PCR: NEGATIVE

## 2018-07-28 LAB — PROTEIN, CSF: Total  Protein, CSF: 19 mg/dL (ref 15–45)

## 2018-07-28 MED ORDER — LORAZEPAM 2 MG/ML IJ SOLN
1.0000 mg | Freq: Once | INTRAMUSCULAR | Status: AC
  Administered 2018-07-28: 1 mg via INTRAVENOUS

## 2018-07-28 MED ORDER — ORAL CARE MOUTH RINSE: 15.0000 mL | Freq: Two times a day (BID) | OROMUCOSAL | Status: DC

## 2018-07-28 MED ORDER — LORAZEPAM 2 MG/ML IJ SOLN: 1.0000 mg | INTRAMUSCULAR | Status: DC | PRN

## 2018-07-28 MED ORDER — SODIUM CHLORIDE 0.9 % IV SOLN
2500.0000 mg | INTRAVENOUS | Status: AC
  Administered 2018-07-28: 2500 mg via INTRAVENOUS
  Filled 2018-07-28: qty 25

## 2018-07-28 MED ORDER — FUROSEMIDE 10 MG/ML IJ SOLN
40.0000 mg | Freq: Once | INTRAMUSCULAR | Status: AC
  Administered 2018-07-28: 40 mg via INTRAVENOUS
  Filled 2018-07-28: qty 4

## 2018-07-28 MED ORDER — CHLORHEXIDINE GLUCONATE 0.12 % MT SOLN
15.0000 mL | Freq: Two times a day (BID) | OROMUCOSAL | Status: DC
  Administered 2018-07-29: 15 mL via OROMUCOSAL
  Filled 2018-07-28 (×3): qty 15

## 2018-07-28 MED ORDER — PIPERACILLIN-TAZOBACTAM 3.375 G IVPB
3.3750 g | Freq: Three times a day (TID) | INTRAVENOUS | Status: DC
  Administered 2018-07-29: 3.375 g via INTRAVENOUS
  Filled 2018-07-28 (×5): qty 50

## 2018-07-28 MED ORDER — LEVETIRACETAM IN NACL 500 MG/100ML IV SOLN
500.0000 mg | Freq: Two times a day (BID) | INTRAVENOUS | Status: DC
  Administered 2018-07-28 – 2018-08-03 (×13): 500 mg via INTRAVENOUS
  Filled 2018-07-28 (×15): qty 100

## 2018-07-28 MED ORDER — LORAZEPAM 2 MG/ML IJ SOLN
INTRAMUSCULAR | Status: AC
  Administered 2018-07-28: 1 mg via INTRAVENOUS
  Filled 2018-07-28: qty 1

## 2018-07-28 MED ORDER — LORAZEPAM 2 MG/ML IJ SOLN
2.0000 mg | Freq: Once | INTRAMUSCULAR | Status: AC
  Administered 2018-07-28: 2 mg via INTRAVENOUS
  Filled 2018-07-28: qty 1

## 2018-07-28 MED ORDER — FUROSEMIDE 10 MG/ML IJ SOLN
40.0000 mg | Freq: Four times a day (QID) | INTRAMUSCULAR | Status: AC
  Administered 2018-07-28 – 2018-07-29 (×3): 40 mg via INTRAVENOUS
  Filled 2018-07-28 (×2): qty 4

## 2018-07-28 NOTE — Progress Notes (Signed)
Pharmacy Antibiotic Note  Juan [name] is a 78 y.o. male with generalized aching back pain admitted on 08/16/2018 with UTI.  Pharmacy has been consulted for cefepime dosing. pseudomonal UTI.   07/28/2018  D# 5 Cefepime  WBC 22.8 trending up  Scr 2.01 trending up  Tm 100.1  Ucx w/ Pseudomonas R to cipro, sensitive to all others tested.  B hydronephrosis resolved after foley placement.  Urology recs chronic indwelling catheter.  possible seizure suspected due to cefepime  Plan:  Stop cefepime as possibly precipitating seizures  Piperacillin/tazobactam 3.375gm IV q8h over 4h infusion  Monitor renal function, neurological status  Height: 6\' 3"  (190.5 cm) Weight: 182 lb 5.1 oz (82.7 kg) IBW/kg (Calculated) : 84.5  Temp (24hrs), Avg:99.6 F (37.6 C), Min:98.6 F (37 C), Max:100.1 F (37.8 C)  Recent Labs  Lab 08/01/2018 2048 07/24/18 0456 07/25/18 0650 07/26/18 0724 07/27/18 0833 07/27/18 1906 07/28/18 0355  WBC 26.8* 21.1* 30.7* 22.8*  --  25.1* 27.9*  CREATININE 1.41* 1.35* 1.71* 2.01* 2.12* 2.13* 2.02*  LATICACIDVEN 0.8  --   --   --   --   --   --     Estimated Creatinine Clearance: 36.4 mL/min (A) (by C-G formula based on SCr of 2.02 mg/dL (H)).    Allergies  Allergen Reactions  . Cefepime Other (See Comments)    Seizures   Antimicrobials this admission: 3/7 rocephin >> x1 ED 3/8 cefepime >>   Dose adjustments this admission:  Microbiology results: 3/8 BCx: ngtd 3/7 UCx: >100k pseudomonas aeruginosa> R cipro, sens to all others 3/8 MRSA PCR: neg  Thank you for allowing pharmacy to be a part of this patient's care.  Doreene Eland, PharmD, BCPS.   Work Cell: [phone] 07/28/2018 3:52 PM

## 2018-07-28 NOTE — Care Management Important Message (Signed)
Important Message  Patient Details  Name: Juan [name] MRN: [id number] Date of Birth: 12/15/1940   Medicare Important Message Given:  Yes    Kerin Salen 07/28/2018, 10:45 AMImportant Message  Patient Details  Name: Juan [name] MRN: [id number] Date of Birth: 01-03-1941   Medicare Important Message Given:  Yes    Kerin Salen 07/28/2018, 10:45 AM

## 2018-07-28 NOTE — Progress Notes (Addendum)
Patient is lethargic, responds to pain. Unable to complete assessment. VS is WNL, non-rebreather mask @15L /min, continuous pulse ox. Will continue to monitor.

## 2018-07-28 NOTE — Significant Event (Signed)
Rapid Response Event Note  Overview: Time Called: 1516 Arrival Time: 1517 Event Type: Respiratory Notified by bedside RN in regards to patient's respiratory status declining. Patient was on 5L [place] and desaturating. Patient was placed on NRB. Called respiratory therapist Tammy to assess patient. After assessing patient, Tammy with respiratory therapy, informed that patient was working to breathe. Upon arrival to patient's room, patient was only responding to pain. Patient was still NRB. O2 Sats 100%. Patient utilizing accessory muscles during respiratory phases. Patient was going to transfer to [facility] for seizures. Until respiratory status is better controlled, will hold transfer to [place].   Initial Focused Assessment: Neuro: Patient unable to follow commands, able to respond to pain, pupils 1-2+ bilaterally, sluggish but reactive to light.  Cardiac: ST-HR 120s, 1+ radial and pedal pulses in all locations bilaterally, s1 and s2 heard upon auscultation Pulmonary: RR 20s-30s, O2 100% on NRB, will desaturate if remove oxygen, breath sounds clear and diminished.   Interventions: Notified MD Rodena Piety to trx patient to SDU -obtain ABG -40mg  of lasix ordered   Plan of Care  When transferring to [facility], patient needs at least Step-down level of care.       Juan [name] C

## 2018-07-28 NOTE — Progress Notes (Signed)
Pt with 50-75 cc of clear brown emesis. Pt cleaned and offered drink of water, pt turned his head away and made eye contact with me, as he has several times when offered sips. Non verbal, but will straighten his arm when asked, for IV occulusion.

## 2018-07-28 NOTE — Procedures (Signed)
Fluoro-guided LP performed at L3-L4.  Opening pressure 12 cm H20.  13.5 ml clear CSF obtained and sent to lab for testing.  Please see dictation in PACS for further details.

## 2018-07-28 NOTE — Progress Notes (Signed)
PT Cancellation Note  Patient Details Name: Juan [name] MRN: [id number] DOB: 1940/06/15   Cancelled Treatment:    Reason Eval/Treat Not Completed: Medical issues which prohibited therapy Pt not responsive again today.  RN reports extensive work up and test throughout today.  PT to sign off at this time.  Please re-order when pt medically stable and able to participate.   Claudett Bayly,KATHrine E 07/28/2018, 11:29 AM Carmelia Bake, PT, DPT Acute Rehabilitation Services Office: [phone] Pager: [phone]

## 2018-07-28 NOTE — Consult Note (Signed)
Neurology Consultation Reason for Consult: Altered mental status Referring Physician: Rodena Piety, E  CC: Altered mental status  History is obtained from: Chart review  HPI: Juan [name] is a 78 y.o. male who was admitted on March 8 with a chief complaint of dysuria and back pain.  He was found to have recurrent Pseudomonas UTI with elevated white count, urine cultures growing Pseudomonas resistant to Cipro, and he was placed on cefepime.  He got 3 doses of 2 g the first day and since has been on 2 g every 12 hours.  On admission, he is noted to be alert, oriented x3.  He was also noted to have left arm weakness on admission.  Since admission, he has had a steadily progressive encephalopathy, getting more confused and lethargic day by day.  This was initially attributed to his urinary tract infection, but today he was almost unresponsive and therefore a CT head and EEG were obtained.  The CT is negative, however the EEG shows frequent periodic discharges concerning for triphasics vs epileptiform GPDs. Neurology was subsequently consulted.    ROS: Unable to obtain due to altered mental status.   Past Medical History:  Diagnosis Date  . Allergic rhinitis   . Anemia   . Ascending aortic aneurysm ([place]) 03/2014   4.3cm on CT scan  . CAD (coronary artery disease)    dx elsewheer in past, no documentation. Non-ischemic myoview 2007  . Chronic diastolic CHF (congestive heart failure), NYHA class 2 (HCC)    Normal EF w/ grade 1 dd by echo 12/2015  . Chronic respiratory failure with hypoxia/2L at home    "2L; all the time" (05/03/2017)  . CKD (chronic kidney disease), stage III ([place])   . Edema    R>L leg, u/s 5-12 neg for DVT  . Hemorrhoid   . History of hydronephrosis   . History of Splenic infarct   . History of thrombocytosis   . Hypertension   . Iron deficiency anemia due to chronic blood loss 03/31/2017  . 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 Migraine    "once/wk at least" (07/11/2013)  .  Moderate to severe pulmonary hypertension ([place])   . Myeloproliferative disease ([place])   . Pyelonephritis 04/29/2017  . Recurrent Pseudomonas urinary tract infection   . Shortness of breath   . Sinus congestion   . Sleep apnea, obstructive    at some point used CPAP, was d/c  years ago  . Type II diabetes mellitus ([place])   . UTI (urinary tract infection) 06/2017  . UTI (urinary tract infection) 07/2017     Family History  Problem Relation Age of Onset  . Schizophrenia Son   . Mental illness Daughter   . Colon cancer Neg Hx   . Prostate cancer Neg Hx   . Heart attack Neg Hx   . Diabetes Neg Hx      Social History:  reports that he quit smoking about 52 years ago. His smoking use included cigarettes. He has a 3.00 pack-year smoking history. He has never used smokeless tobacco. He reports that he does not drink alcohol or use drugs.   Exam: Current vital signs: BP (!) 146/65 (BP Location: Left Arm)   Pulse 95   Temp 99.5 F (37.5 C) (Axillary)   Resp (!) 22   Ht 6\' 3"  (1.905 m)   Wt 82.7 kg   SpO2 96%   BMI 22.79 kg/m  Vital signs in last 24 hours: Temp:  [99 F (37.2 C)-100.1 F (37.8  C)] 99.5 F (37.5 C) (03/12 1206) Pulse Rate:  [95-106] 95 (03/12 1206) Resp:  [14-22] 22 (03/12 1206) BP: (123-148)/(65-70) 146/65 (03/12 1206) SpO2:  [95 %-98 %] 96 % (03/12 1206)   Physical Exam  Constitutional: Appears elderly Psych: Does not talk much Eyes: No scleral injection HENT: No OP obstrucion Head: Normocephalic.  Cardiovascular: Normal rate and regular rhythm.  Respiratory: Effort normal, non-labored breathing GI: Soft.  No distension. There is no tenderness.  Skin: WDI  Neuro: Mental Status: Patient is somnolent, but is able to tell me his name, able to follow commands and answer simple yes/no questions.  He is encephalopathic. Cranial Nerves: II: Visual Fields are full. Pupils are equal, round, and reactive to light.   III,IV, VI: EOMI without ptosis or  diploplia.  V: Facial sensation is symmetric to temperature VII: Facial movement is symmetric.  VIII: hearing is intact to voice X: Uvula elevates symmetrically XI: Shoulder shrug is symmetric. XII: tongue is midline without atrophy or fasciculations.  Motor: He does not move his left arm as much as his right, but he does follow commands to squeeze fingers in bilateral hands and wiggle toes on bilateral legs he withdraws to noxious stimulation in all 4 extremities Sensory: As above  Cerebellar: He does not perform  I have reviewed labs in epic and the results pertinent to this consultation are: Leukocytosis at 27 GFR 36  I have reviewed the images obtained: CT head-unremarkable  Impression: 78 year old male with encephalopathy in the setting of pseudomonas complicated UTI as well as high-dose cefepime and treatment of this.  The EEG is concerning for possible nonconvulsive status epilepticus, but given his degree of interactivity I think this is unlikely.  Would favor holding cefepime and transferring to [facility] for consideration of continuous EEG if he does not continue to improve.  Recommendations: 1) Discontinue cefepime 2) Agree with L-spine MRI/LP 3) Will add brain MRI as well 4) transfer to [facility] for continuous EEG 5) Keppra 500 twice daily after initial 2500 mg load 6) neurology will follow.   Roland Rack, MD [facility] [phone]  If 7pm- 7am, please page neurology on call as listed in [place].

## 2018-07-28 NOTE — Consult Note (Signed)
[facility]    Date of Admission:  07/22/2018   Total days of antibiotics: 5 cefepime               Reason for Consult: UTI, mental status change    Referring Provider: Nevada Crane   Assessment: UTI Mental status change Suspected cefepime neurotoxicity  Plan: 1. Stop cefepime 2. Start zosyn 3. Aim to stop anbx soon (he has gotten 5 days so far for complicated UTI) 4. Await results of LP 5. Transferring to ICU 6. Defer foley to urology.  7. WBC is chronically elevated due to MDS, now slightly up from baseline.  will be difficult to monitor.  8. Appreciate pharm assistance 9. Appreciate neuro, uro assitance.   Thank you so much for this interesting consult,  Principal Problem:   Complicated UTI (urinary tract infection) Active Problems:   Coronary atherosclerosis   CKD (chronic kidney disease) stage 3, GFR 30-59 ml/min (HCC)   Hydronephrosis   Iron deficiency anemia due to chronic blood loss   Hypokalemia   Moderate to severe pulmonary hypertension (HCC)   Chronic respiratory failure with hypoxia (HCC)   Chronic diastolic CHF (congestive heart failure), NYHA class 2 (HCC)   Myelodysplasia (myelodysplastic syndrome) (HCC)   HTN (hypertension)   Normocytic anemia   Back pain   . B-complex with vitamin C  1 tablet Oral Daily  . clotrimazole  1 application Topical BID  . heparin  5,000 Units Subcutaneous Q8H  . hydrocortisone cream  1 application Topical BID  . LORazepam  1 mg Intravenous Once  . mouth rinse  15 mL Mouth Rinse BID    HPI: Juan [name] is a 78 y.o. male with hx of DM controlled by diet, myelodysplastic syndrome, CKD3, home O2 at 2L, hydronephrosis and recurrent UTIs.  Comes to hospital on 3-8 with 3 days of low back pain, dysuria and frequency. No f/c.  In ED his WBC was 26.8, UA with pyuria, afebrile. Cr at baseline.  His CT showed findings consistent with chronic bladder outlet obstruction with cystitis and UTI.  He was  started on cefepime and a foley was placed. He had f/u with urology who suggested chronic foley for pt.  By 3-10 he complained of weakness and pain in his L arm (old).  Today he is more somnolent/encephalopathic. He had repeat head CT (no acute change). He underwent LP (pending).  He was seen by neuro who recommended continuous EEG monitoring.  Currently somnolent after recieveing ativan, anti-seizure rx.   UCx- p aeruginosa (s- imipenem, ceftaz, cefepime, zosyn, gent. R- cipro)  Review of Systems: Review of Systems  Unable to perform ROS: Acuity of condition    Past Medical History:  Diagnosis Date  . Allergic rhinitis   . Anemia   . Ascending aortic aneurysm ([place]) 03/2014   4.3cm on CT scan  . CAD (coronary artery disease)    dx elsewheer in past, no documentation. Non-ischemic myoview 2007  . Chronic diastolic CHF (congestive heart failure), NYHA class 2 (HCC)    Normal EF w/ grade 1 dd by echo 12/2015  . Chronic respiratory failure with hypoxia/2L at home    "2L; all the time" (05/03/2017)  . CKD (chronic kidney disease), stage III ([place])   . Edema    R>L leg, u/s 5-12 neg for DVT  . Hemorrhoid   . History of hydronephrosis   . History of Splenic infarct   . History of  thrombocytosis   . Hypertension   . Iron deficiency anemia due to chronic blood loss 03/31/2017  . 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 Migraine    "once/wk at least" (07/11/2013)  . Moderate to severe pulmonary hypertension ([place])   . Myeloproliferative disease ([place])   . Pyelonephritis 04/29/2017  . Recurrent Pseudomonas urinary tract infection   . Shortness of breath   . Sinus congestion   . Sleep apnea, obstructive    at some point used CPAP, was d/c  years ago  . Type II diabetes mellitus ([place])   . UTI (urinary tract infection) 06/2017  . UTI (urinary tract infection) 07/2017    Social History   Tobacco Use  . Smoking status: Former Smoker    Packs/day: 0.25    Years: 12.00    Pack years: 3.00     Types: Cigarettes    Last attempt to quit: 05/18/1966    Years since quitting: 52.2  . Smokeless tobacco: Never Used  . Tobacco comment: quit smoking 45 years ago  Substance Use Topics  . Alcohol use: No    Alcohol/week: 0.0 standard drinks  . Drug use: No    Family History  Problem Relation Age of Onset  . Schizophrenia Son   . Mental illness Daughter   . Colon cancer Neg Hx   . Prostate cancer Neg Hx   . Heart attack Neg Hx   . Diabetes Neg Hx      Medications:  Scheduled: . B-complex with vitamin C  1 tablet Oral Daily  . clotrimazole  1 application Topical BID  . heparin  5,000 Units Subcutaneous Q8H  . hydrocortisone cream  1 application Topical BID  . mouth rinse  15 mL Mouth Rinse BID    Abtx:  Anti-infectives (From admission, onward)   Start     Dose/Rate Route Frequency Ordered Stop   07/24/18 0300  ceFEPIme (MAXIPIME) 2 g in sodium chloride 0.9 % 100 mL IVPB  Status:  Discontinued     2 g 200 mL/hr over 30 Minutes Intravenous 2 times daily 07/24/18 0039 07/28/18 1250   07/28/2018 2200  cefTRIAXone (ROCEPHIN) 1 g in sodium chloride 0.9 % 100 mL IVPB     1 g 200 mL/hr over 30 Minutes Intravenous  Once 08/11/2018 2154 08/11/2018 2236        OBJECTIVE: Blood pressure (!) 146/65, pulse 95, temperature 99.5 F (37.5 C), temperature source Axillary, resp. rate (!) 22, height 6\' 3"  (1.905 m), weight 82.7 kg, SpO2 96 %.  Physical Exam Eyes:     Extraocular Movements: Extraocular movements intact.  Neck:     Musculoskeletal: Neck supple. No neck rigidity.  Cardiovascular:     Rate and Rhythm: Normal rate and regular rhythm.  Pulmonary:     Effort: Pulmonary effort is normal.     Breath sounds: Normal breath sounds.  Abdominal:     General: Bowel sounds are normal. There is no distension.     Palpations: Abdomen is soft.     Tenderness: There is no abdominal tenderness.  Musculoskeletal:        General: No swelling or tenderness.  Neurological:      Comments: Lethargic, minimal response to voice (turns head, mumbles).      Lab Results Results for orders placed or performed during the hospital encounter of 07/28/2018 (from the past 48 hour(s))  Basic metabolic panel     Status: Abnormal   Collection Time: 07/27/18  8:33 AM  Result Value Ref  Range   Sodium 135 135 - 145 mmol/L   Potassium 3.9 3.5 - 5.1 mmol/L   Chloride 102 98 - 111 mmol/L   CO2 17 (L) 22 - 32 mmol/L   Glucose, Bld 104 (H) 70 - 99 mg/dL   BUN 83 (H) 8 - 23 mg/dL   Creatinine, Ser 2.12 (H) 0.61 - 1.24 mg/dL   Calcium 9.3 8.9 - 10.3 mg/dL   GFR calc non Af Amer 29 (L) >60 mL/min   GFR calc Af Amer 34 (L) >60 mL/min   Anion gap 16 (H) 5 - 15    Comment: Performed at [facility], [place] [address], [place], [place] [address]  Ammonia     Status: None   Collection Time: 07/27/18 12:09 PM  Result Value Ref Range   Ammonia 18 9 - 35 umol/L    Comment: Performed at [facility], [place] [address], [place], [place] [address]  Blood gas, arterial     Status: Abnormal   Collection Time: 07/27/18  5:54 PM  Result Value Ref Range   O2 Content 2.0 L/min   Delivery systems NASAL CANNULA    pH, Arterial 7.359 7.350 - 7.450   pCO2 arterial 32.3 32.0 - 48.0 mmHg   pO2, Arterial 92.7 83.0 - 108.0 mmHg   Bicarbonate 17.7 (L) 20.0 - 28.0 mmol/L   Acid-base deficit 6.5 (H) 0.0 - 2.0 mmol/L   O2 Saturation 97.1 %   Patient temperature 98.6    Collection site RIGHT RADIAL    Drawn by [phone]    Sample type ARTERIAL DRAW    Allens test (pass/fail) PASS PASS    Comment: Performed at [facility], [place] [address] [place],  [address]  CBC     Status: Abnormal   Collection Time: 07/27/18  7:06 PM  Result Value Ref Range   WBC 25.1 (H) 4.0 - 10.5 K/uL   RBC 3.49 (L) 4.22 - 5.81 MIL/uL   Hemoglobin 8.8 (L) 13.0 - 17.0 g/dL   HCT 30.0 (L) 39.0 - 52.0 %   MCV 86.0 80.0 - 100.0 fL   MCH 25.2 (L) 26.0 - 34.0 pg   MCHC 29.3  (L) 30.0 - 36.0 g/dL   RDW 17.4 (H) 11.5 - 15.5 %   Platelets 136 (L) 150 - 400 K/uL   nRBC 0.2 0.0 - 0.2 %    Comment: Performed at [facility], [place] [address], [place],  [address]  Comprehensive metabolic panel     Status: Abnormal   Collection Time: 07/27/18  7:06 PM  Result Value Ref Range   Sodium 138 135 - 145 mmol/L   Potassium 3.9 3.5 - 5.1 mmol/L   Chloride 107 98 - 111 mmol/L   CO2 17 (L) 22 - 32 mmol/L   Glucose, Bld 99 70 - 99 mg/dL   BUN 86 (H) 8 - 23 mg/dL   Creatinine, Ser 2.13 (H) 0.61 - 1.24 mg/dL   Calcium 9.1 8.9 - 10.3 mg/dL   Total Protein 6.1 (L) 6.5 - 8.1 g/dL   Albumin 2.6 (L) 3.5 - 5.0 g/dL   AST 23 15 - 41 U/L   ALT 13 0 - 44 U/L   Alkaline Phosphatase 84 38 - 126 U/L   Total Bilirubin 1.0 0.3 - 1.2 mg/dL   GFR calc non Af Amer 29 (L) >60 mL/min   GFR calc Af Amer 34 (L) >60 mL/min   Anion gap 14 5 -  15    Comment: Performed at [facility], [place] [address], [place], [place] [address]  CBC     Status: Abnormal   Collection Time: 07/28/18  3:55 AM  Result Value Ref Range   WBC 27.9 (H) 4.0 - 10.5 K/uL   RBC 3.37 (L) 4.22 - 5.81 MIL/uL   Hemoglobin 8.4 (L) 13.0 - 17.0 g/dL   HCT 29.0 (L) 39.0 - 52.0 %   MCV 86.1 80.0 - 100.0 fL   MCH 24.9 (L) 26.0 - 34.0 pg   MCHC 29.0 (L) 30.0 - 36.0 g/dL   RDW 17.8 (H) 11.5 - 15.5 %   Platelets 141 (L) 150 - 400 K/uL   nRBC 0.3 (H) 0.0 - 0.2 %    Comment: Performed at [facility], [place] [address] [place], [place] [address]  Comprehensive metabolic panel     Status: Abnormal   Collection Time: 07/28/18  3:55 AM  Result Value Ref Range   Sodium 141 135 - 145 mmol/L   Potassium 4.0 3.5 - 5.1 mmol/L   Chloride 110 98 - 111 mmol/L   CO2 17 (L) 22 - 32 mmol/L   Glucose, Bld 103 (H) 70 - 99 mg/dL   BUN 89 (H) 8 - 23 mg/dL   Creatinine, Ser 2.02 (H) 0.61 - 1.24 mg/dL   Calcium 9.2 8.9 - 10.3 mg/dL   Total Protein 6.2 (L) 6.5 - 8.1 g/dL   Albumin 2.4  (L) 3.5 - 5.0 g/dL   AST 26 15 - 41 U/L   ALT 14 0 - 44 U/L   Alkaline Phosphatase 81 38 - 126 U/L   Total Bilirubin 0.8 0.3 - 1.2 mg/dL   GFR calc non Af Amer 31 (L) >60 mL/min   GFR calc Af Amer 36 (L) >60 mL/min   Anion gap 14 5 - 15    Comment: Performed at [facility], [place] [address] [place], [place] [address]      Component Value Date/Time   SDES  07/24/2018 0059    BLOOD RIGHT ARM Performed at [facility], [place] [address], [place], [place] [address]    SDES  07/24/2018 2831    BLOOD LEFT HAND Performed at [facility], [place] [address], [place], [place] [address]    SPECREQUEST  07/24/2018 0059    BOTTLES DRAWN AEROBIC AND ANAEROBIC Blood Culture adequate volume Performed at [facility] [address], [place], [place] [address]    SPECREQUEST  07/24/2018 0059    BOTTLES DRAWN AEROBIC AND ANAEROBIC Blood Culture adequate volume Performed at [facility] [address], [place], [place] [address]    CULT  07/24/2018 0059    NO GROWTH 4 DAYS Performed at [facility] [address], [place],  [address]    CULT  07/24/2018 0059    NO GROWTH 4 DAYS Performed at [facility], [place] [address], [place],  [address]    REPTSTATUS PENDING 07/24/2018 0059   REPTSTATUS PENDING 07/24/2018 0059   Dg Chest 1 View  Result Date: 07/28/2018 CLINICAL DATA:  Leukocytosis. Fever. EXAM: CHEST  1 VIEW COMPARISON:  07/26/2018 FINDINGS: Cardiomediastinal silhouette is enlarged. Mediastinal contours appear intact. There is no evidence of pneumothorax. Chronic elevation of the right hemidiaphragm. Increased interstitial markings with additional peribronchovascular airspace consolidation in the lung bases. Diffusely mottled appearance of the osseous structures. Soft tissues are grossly normal. IMPRESSION: 1. Enlarged cardiac  silhouette. 2. Increased interstitial markings with additional  peribronchovascular airspace consolidation in the lung bases may represent mixed pattern pulmonary edema or developing multifocal pneumonia. 3. Diffusely mottled appearance of the osseous structures, likely due to diffuse osseous metastatic disease or myeloproliferative disorder. Electronically Signed   By: Fidela Salisbury M.D.   On: 07/28/2018 11:21   Ct Head Wo Contrast  Result Date: 07/28/2018 CLINICAL DATA:  Altered level of consciousness EXAM: CT HEAD WITHOUT CONTRAST TECHNIQUE: Contiguous axial images were obtained from the base of the skull through the vertex without intravenous contrast. COMPARISON:  Two days ago FINDINGS: Brain: Apparent low-density in the right occipital region is attributed to streak artifact. There is no edema, consolidation, effusion, or pneumothorax. Mild small vessel ischemic gliosis in the periventricular white matter. Mild cerebral volume loss. Vascular: No hyperdense vessel or unexpected calcification. Skull: Normal. Negative for fracture or focal lesion. Sinuses/Orbits: Negative IMPRESSION: No acute finding or change from prior. Electronically Signed   By: Monte Fantasia M.D.   On: 07/28/2018 07:51   US Renal  Result Date: 07/26/2018 CLINICAL DATA:  78 year old male for follow-up of hydronephrosis. EXAM: RENAL / URINARY TRACT ULTRASOUND COMPLETE COMPARISON:  07/31/2018 CT FINDINGS: Right Kidney: Renal measurements: 11.1 x 4.9 x 5.9 cm = volume: 165 mL. Increased echogenicity noted. No mass or hydronephrosis visualized. Left Kidney: Renal measurements: 12.4 x 6.6 x 6.7 cm = volume: 288 mL. Increased echogenicity noted. No mass or hydronephrosis visualized. Bladder: A Foley catheter within the bladder is noted. IMPRESSION: 1. Resolution of bilateral hydronephrosis since 08/06/2018. 2. Increased renal echogenicity within both kidneys compatible with medical renal disease. 3. Foley catheter within the bladder. Electronically Signed   By: Margarette Canada M.D.   On: 07/26/2018  21:01   US Abdomen Limited Ruq  Result Date: 07/28/2018 CLINICAL DATA:  Fever. Possible sludge seen in the gallbladder on CT. EXAM: ULTRASOUND ABDOMEN LIMITED RIGHT UPPER QUADRANT COMPARISON:  CT, 07/30/2018 FINDINGS: Gallbladder: 7 mm stone in the dependent gallbladder. No wall thickening. No pericholecystic fluid. Common bile duct: Diameter: 3 mm Liver: No focal lesion identified. Within normal limits in parenchymal echogenicity. Portal vein is patent on color Doppler imaging with normal direction of blood flow towards the liver. IMPRESSION: 1. No acute findings. 2. Single visualized gallstone. No evidence of acute cholecystitis. No other abnormality. Electronically Signed   By: Lajean Manes M.D.   On: 07/28/2018 13:50   Dg Fluoro Guide Lumbar Puncture  Result Date: 07/28/2018 Etheleen Mayhew, MD     07/28/2018  3:17 PM Fluoro-guided LP performed at L3-L4.  Opening pressure 12 cm H20.  13.5 ml clear CSF obtained and sent to lab for testing.  Please see dictation in PACS for further details.   Recent Results (from the past 240 hour(s))  Urine culture     Status: Abnormal   Collection Time: 07/31/2018  8:17 PM  Result Value Ref Range Status   Specimen Description   Final    URINE, RANDOM Performed at [facility] [address] [place], [place] [address]    Special Requests   Final    NONE Performed at [facility], [place] [address], [place], [address] [address]    Culture >=100,000 COLONIES/mL PSEUDOMONAS AERUGINOSA (A)  Final   Report Status 07/26/2018 FINAL  Final   Organism ID, Bacteria PSEUDOMONAS AERUGINOSA (A)  Final      Susceptibility   Pseudomonas aeruginosa - MIC*    CEFTAZIDIME <=1 SENSITIVE Sensitive  CIPROFLOXACIN >=4 RESISTANT Resistant     GENTAMICIN <=1 SENSITIVE Sensitive     IMIPENEM 2 SENSITIVE Sensitive     PIP/TAZO <=4 SENSITIVE Sensitive     CEFEPIME 4 SENSITIVE Sensitive     * >=100,000 COLONIES/mL PSEUDOMONAS AERUGINOSA   Culture, blood (Routine X 2) w Reflex to ID Panel     Status: None (Preliminary result)   Collection Time: 07/24/18 12:59 AM  Result Value Ref Range Status   Specimen Description   Final    BLOOD RIGHT ARM Performed at [facility], [place] [address], [place], [place] [address]    Special Requests   Final    BOTTLES DRAWN AEROBIC AND ANAEROBIC Blood Culture adequate volume Performed at [facility] [address] [place], [place] [address]    Culture   Final    NO GROWTH 4 DAYS Performed at [facility], [place] [address] [place], [place] [address]    Report Status PENDING  Incomplete  Culture, blood (Routine X 2) w Reflex to ID Panel     Status: None (Preliminary result)   Collection Time: 07/24/18 12:59 AM  Result Value Ref Range Status   Specimen Description   Final    BLOOD LEFT HAND Performed at [facility] [address] [place] [address] [place], [place] [address]    Special Requests   Final    BOTTLES DRAWN AEROBIC AND ANAEROBIC Blood Culture adequate volume Performed at [facility] [address] [place], [place] [address]    Culture   Final    NO GROWTH 4 DAYS Performed at [facility], [place] [address] [place], [place] [address]    Report Status PENDING  Incomplete  MRSA PCR Screening     Status: None   Collection Time: 07/24/18  4:49 AM  Result Value Ref Range Status   MRSA by PCR NEGATIVE NEGATIVE Final    Comment:        The GeneXpert MRSA Assay (FDA approved for NASAL specimens only), is one component of a comprehensive MRSA colonization surveillance program. It is not intended to diagnose MRSA infection nor to guide or monitor treatment for MRSA infections. Performed at [facility], [place] [address] [place], [place] [address]     Microbiology: Recent Results (from the past 240 hour(s))  Urine culture     Status: Abnormal   Collection Time: 07/26/2018  8:17 PM  Result  Value Ref Range Status   Specimen Description   Final    URINE, RANDOM Performed at [facility] [address], [place],  [address]    Special Requests   Final    NONE Performed at [facility], [place] [address], [place], [place] [address]    Culture >=100,000 COLONIES/mL PSEUDOMONAS AERUGINOSA (A)  Final   Report Status 07/26/2018 FINAL  Final   Organism ID, Bacteria PSEUDOMONAS AERUGINOSA (A)  Final      Susceptibility   Pseudomonas aeruginosa - MIC*    CEFTAZIDIME <=1 SENSITIVE Sensitive     CIPROFLOXACIN >=4 RESISTANT Resistant     GENTAMICIN <=1 SENSITIVE Sensitive     IMIPENEM 2 SENSITIVE Sensitive     PIP/TAZO <=4 SENSITIVE Sensitive     CEFEPIME 4 SENSITIVE Sensitive     * >=100,000 COLONIES/mL PSEUDOMONAS AERUGINOSA  Culture, blood (Routine X 2) w Reflex to ID Panel     Status: None (Preliminary result)   Collection Time: 07/24/18 12:59 AM  Result Value Ref Range Status   Specimen Description   Final    BLOOD RIGHT ARM Performed at [facility], [place] [address] [place], [place] [address]    Special Requests   Final    BOTTLES DRAWN AEROBIC AND ANAEROBIC Blood Culture adequate volume Performed at [facility] [address] [place], [place] [address]    Culture   Final    NO GROWTH 4 DAYS Performed at [facility], [place] [address] [place], [place] [address]    Report Status PENDING  Incomplete  Culture, blood (Routine X 2) w Reflex to ID Panel     Status: None (Preliminary result)   Collection Time: 07/24/18 12:59 AM  Result Value Ref Range Status   Specimen Description   Final    BLOOD LEFT HAND Performed at [facility] [address] [place], [place] [address]    Special Requests   Final    BOTTLES DRAWN AEROBIC AND ANAEROBIC Blood Culture adequate volume Performed at [facility] [address], [place], [place] [address]    Culture   Final     NO GROWTH 4 DAYS Performed at [facility], [place] [address] [place], [place] [address]    Report Status PENDING  Incomplete  MRSA PCR Screening     Status: None   Collection Time: 07/24/18  4:49 AM  Result Value Ref Range Status   MRSA by PCR NEGATIVE NEGATIVE Final    Comment:        The GeneXpert MRSA Assay (FDA approved for NASAL specimens only), is one component of a comprehensive MRSA colonization surveillance program. It is not intended to diagnose MRSA infection nor to guide or monitor treatment for MRSA infections. Performed at [facility], [place] [name] [place], [place] [address]     Radiographs and labs were personally reviewed by me.   Bobby Rumpf, MD [facility] [phone] 07/28/2018, 3:22 PM

## 2018-07-28 NOTE — Progress Notes (Addendum)
PROGRESS NOTE    Juan [name]  OEU:[id number] DOB: [birth year] DOA: 07/24/2018 PCP: Colon Branch, MD  Brief Narrative:78 y.o.malewith medical history significant ofhypertension, diet-controlled diabetes, chronic respiratory failure on 2 L oxygen, ascending aortic aneurysm, CAD,dCHF, CKD 3, iron deficiency anemia, OSA not on CPAP, myeloproliferative disease, splenic infarct, hydronephrosis, recurrentpseudomonal UTI who presents with dysuria and back pain.  WBC 26.8, positive urinalysis.TRH asked to admit. Admitted for complicated UTI.  Hospital course complicated by lower urinary tract symptoms for which he was started on Pyridium with improvement of his symptoms. Due to bilateral hydronephrosis and urinary retention a Foley catheter was placed in on 07/25/2018 and Dr. Louis Meckel was called for consultation.   Assessment & Plan:   Principal Problem:   Complicated UTI (urinary tract infection) Active Problems:   Coronary atherosclerosis   CKD (chronic kidney disease) stage 3, GFR 30-59 ml/min (HCC)   Hydronephrosis   Iron deficiency anemia due to chronic blood loss   Hypokalemia   Moderate to severe pulmonary hypertension (HCC)   Chronic respiratory failure with hypoxia (HCC)   Chronic diastolic CHF (congestive heart failure), NYHA class 2 (HCC)   Myelodysplasia (myelodysplastic syndrome) (HCC)   HTN (hypertension)   Normocytic anemia   Back pain  Pseudomonas UTI with lower urinary tract symptoms-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  Reviewed urology notes.  Patient has refused Foley catheter in the past.  He was admitted with hydronephrosis bilaterally which was resolved with placement of Foley catheter.  Bladder scan showed a residual of 401 cc. Urine culture positive for greater than 100,000 colonies of pseudomonas aeruginosa sensitive to cefepime resistant to ciprofloxacin.  Blood culture with no growth.  Metabolic encephalopathy acute-patient  more somnolent not responding to commands or voices or not eating or drinking.  However he  does respond to pain.  Stat CT scan that was done today in comparison with 2 days ago did not reveal any acute changes.  Patient did not elicit any spinal tenderness.  However with a history of recurrent UTIs will order MRI of the lumbar spine to rule out epidural abscess.  I have also consulted infectious disease Dr. Johnnye Sima and consulted IR for lumbar puncture.  CSF studies to be done after LP ordered.  A right upper quadrant ultrasound ordered for evidence of gallbladder sludge by CT scan at the time of admission. EEG ordered to rule out seizures.  EEG suspicious for seizures but not confirmed yet discussed with neurology Dr. Leonel Ramsay.  [name] and needs to be transferred to [facility] for this.  Patient received a dose of Ativan 2 mg this afternoon after which he became little bit more awake and was answering questions and responding to commands better than this morning. Neuro suspects cefepime induced neurotoxicity so cefepime has been stopped patient will be on Zosyn he has already received 5 days of cefepime for complicated UTI so the Zosyn should not be too long.   Addendum patient was moved to the ICU as he became very hypoxic after coming back from radiology.  IV fluid was stopped Lasix 40 mg IV was given stat ABG and chest x-ray ordered chest x-ray shows findings possibly concerning for pulmonary edema versus pneumonia.  ABG pending.   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  AKI on CKD (chronic kidney disease) stage 3, GFR 30-59 ml/min (HCC) was thought to be  secondary to post renal secondary to urinary retention with bilateral hydronephrosis: Presented with creatinine of 1.35 which appears to be his baseline with GFR 59 Creatinine today3/02/2019 is 2.01 from1.71 yesterday Was on Lasix 80 mg p.o. twice daily at home Hold off Lasix for now Continue to avoid nephrotoxic agents/hypotension   Resolved uncontrolled hypertension Continuehydralazine dose to 100 mg 3 times daily Continue to holdLasix 80 mg twice daily due to AKI Continue to monitor vital signs  Physical debility/ambulatory dysfunction PT to assess Fall precautions Requests wheelchair privilege Patient is from home and will most likely need SNF placement CSW consulted to assist with placement  Coronary atherosclerosis:  -No CP -Continue aspirin  Iron deficiency anemia Hemoglobin stable 9.4 from 10.3 No sign of overt bleeding Continue ferrous sulfate 325 mg p.o. daily  DVT ppx: SQ Heparin 3 times daily Code Status:Full code Family Communication: [phone] d/w son jason  Disposition Plan:Plan is to discharge him to [place] home once he is medically stable   Consults called:Urology infectious disease    Estimated body mass index is 22.79 kg/m as calculated from the following:   Height as of this encounter: 6\' 3"  (1.905 m).   Weight as of this encounter: 82.7 kg.   Subjective: Patient laying in bed with eyes closed does not respond to questions does not follow commands does not respond to pain Staff does not report any new bowel changes.  He had a bowel movement while we were turning him today.  No diarrhea. Objective: Vitals:   07/27/18 2325 07/28/18 0523 07/28/18 0752 07/28/18 0815  BP: 137/69 (!) 143/65  (!) 148/70  Pulse: (!) 106 (!) 103  98  Resp: 20 20  17   Temp: 99.8 F (37.7 C) 100.1 F (37.8 C) 100 F (37.8 C) 99 F (37.2 C)  TempSrc: Oral Oral Rectal Axillary  SpO2: 98% 95%  97%  Weight:      Height:        Intake/Output  Summary (Last 24 hours) at 07/28/2018 1142 Last data filed at 07/28/2018 1117 Gross per 24 hour  Intake 1817.96 ml  Output 1450 ml  Net 367.96 ml   Filed Weights   07/24/18 0000 07/24/18 0155  Weight: 92.5 kg 82.7 kg    Examination:  General exam: Lethargic somnolent elderly male chronically ill-appearing with fine tremors and mumbling Respiratory system: Clear to auscultation. Respiratory effort normal. Cardiovascular system: S1 & S2 heard, RRR. No JVD, murmurs, rubs, gallops or clicks. No pedal edema. Gastrointestinal system: Abdomen is nondistended, soft and nontender. No organomegaly or masses felt. Normal bowel sounds heard. Central nervous system patient does not follow commands.  Moves upper extremities involuntarily.  Does not move lower extremities when asked to move.  Lower extremities flaccid. Extremities: No edema Skin: No rashes, lesions or ulcers Psychiatry: Judgement and insight appear normal. Mood & affect appropriate.     Data Reviewed: I have personally reviewed following labs and imaging studies  CBC: Recent Labs  Lab 07/31/2018 2048 07/24/18 0456 07/25/18 0650 07/26/18 0724 07/27/18 1906 07/28/18 0355  WBC 26.8* 21.1* 30.7* 22.8* 25.1* 27.9*  NEUTROABS 11.9*  --   --   --   --   --   HGB 10.3* 9.4* 10.3* 9.4* 8.8* 8.4*  HCT 35.5* 33.5* 35.7* 32.6* 30.0* 29.0*  MCV  86.6 87.9 87.5 86.7 86.0 86.1  PLT 165 146* 142* 129* 136* 354*   Basic Metabolic Panel: Recent Labs  Lab 07/24/18 0456 07/25/18 0650 07/26/18 0724 07/27/18 0833 07/27/18 1906 07/28/18 0355  NA 141 137 134* 135 138 141  K 3.5 3.6 3.9 3.9 3.9 4.0  CL 109 104 103 102 107 110  CO2 23 22 20* 17* 17* 17*  GLUCOSE 111* 120* 109* 104* 99 103*  BUN 64* 64* 74* 83* 86* 89*  CREATININE 1.35* 1.71* 2.01* 2.12* 2.13* 2.02*  CALCIUM 9.5 9.7 9.3 9.3 9.1 9.2  MG 2.4 2.4  --   --   --   --    GFR: Estimated Creatinine Clearance: 36.4 mL/min (A) (by C-G formula based on SCr of 2.02 mg/dL (H)).  Liver Function Tests: Recent Labs  Lab 07/24/2018 2048 07/27/18 1906 07/28/18 0355  AST 23 23 26   ALT 19 13 14   ALKPHOS 110 84 81  BILITOT 0.5 1.0 0.8  PROT 6.7 6.1* 6.2*  ALBUMIN 3.4* 2.6* 2.4*   No results for input(s): LIPASE, AMYLASE in the last 168 hours. Recent Labs  Lab 07/27/18 1209  AMMONIA 18   Coagulation Profile: No results for input(s): INR, PROTIME in the last 168 hours. Cardiac Enzymes: No results for input(s): CKTOTAL, CKMB, CKMBINDEX, TROPONINI in the last 168 hours. BNP (last 3 results) No results for input(s): PROBNP in the last 8760 hours. HbA1C: No results for input(s): HGBA1C in the last 72 hours. CBG: No results for input(s): GLUCAP in the last 168 hours. Lipid Profile: No results for input(s): CHOL, HDL, LDLCALC, TRIG, CHOLHDL, LDLDIRECT in the last 72 hours. Thyroid Function Tests: No results for input(s): TSH, T4TOTAL, FREET4, T3FREE, THYROIDAB in the last 72 hours. Anemia Panel: No results for input(s): VITAMINB12, FOLATE, FERRITIN, TIBC, IRON, RETICCTPCT in the last 72 hours. Sepsis Labs: Recent Labs  Lab 08/05/2018 2048  LATICACIDVEN 0.8    Recent Results (from the past 240 hour(s))  Urine culture     Status: Abnormal   Collection Time: 08/08/2018  8:17 PM  Result Value Ref Range Status   Specimen Description   Final    URINE, RANDOM Performed at [place] [address], [place], [place] [address]    Special Requests   Final    NONE Performed at [facility], [place] [address] [place], [place] [address]    Culture >=100,000 COLONIES/mL PSEUDOMONAS AERUGINOSA (A)  Final   Report Status 07/26/2018 FINAL  Final   Organism ID, Bacteria PSEUDOMONAS AERUGINOSA (A)  Final      Susceptibility   Pseudomonas aeruginosa - MIC*    CEFTAZIDIME <=1 SENSITIVE Sensitive     CIPROFLOXACIN >=4 RESISTANT Resistant     GENTAMICIN <=1 SENSITIVE Sensitive     IMIPENEM 2 SENSITIVE Sensitive     PIP/TAZO <=4  SENSITIVE Sensitive     CEFEPIME 4 SENSITIVE Sensitive     * >=100,000 COLONIES/mL PSEUDOMONAS AERUGINOSA  Culture, blood (Routine X 2) w Reflex to ID Panel     Status: None (Preliminary result)   Collection Time: 07/24/18 12:59 AM  Result Value Ref Range Status   Specimen Description   Final    BLOOD RIGHT ARM Performed at [facility], [place] [address], [place], [place] [address]    Special Requests   Final    BOTTLES DRAWN AEROBIC AND ANAEROBIC Blood Culture adequate volume Performed at [place] [address] [place],  [address]  Culture   Final    NO GROWTH 4 DAYS Performed at [facility], [place] [address], [place], [place] [address]    Report Status PENDING  Incomplete  Culture, blood (Routine X 2) w Reflex to ID Panel     Status: None (Preliminary result)   Collection Time: 07/24/18 12:59 AM  Result Value Ref Range Status   Specimen Description   Final    BLOOD LEFT HAND Performed at [place] [address], [place], [place] [address]    Special Requests   Final    BOTTLES DRAWN AEROBIC AND ANAEROBIC Blood Culture adequate volume Performed at [place] [address] [place], [place] [address]    Culture   Final    NO GROWTH 4 DAYS Performed at [facility], [place] [address] [place], [place] [address]    Report Status PENDING  Incomplete  MRSA PCR Screening     Status: None   Collection Time: 07/24/18  4:49 AM  Result Value Ref Range Status   MRSA by PCR NEGATIVE NEGATIVE Final    Comment:        The GeneXpert MRSA Assay (FDA approved for NASAL specimens only), is one component of a comprehensive MRSA colonization surveillance program. It is not intended to diagnose MRSA infection nor to guide or monitor treatment for MRSA infections. Performed at [facility], [place] [address] [place], [place] [address]          Radiology Studies: Dg Chest  1 View  Result Date: 07/28/2018 CLINICAL DATA:  Leukocytosis. Fever. EXAM: CHEST  1 VIEW COMPARISON:  08/14/2018 FINDINGS: Cardiomediastinal silhouette is enlarged. Mediastinal contours appear intact. There is no evidence of pneumothorax. Chronic elevation of the right hemidiaphragm. Increased interstitial markings with additional peribronchovascular airspace consolidation in the lung bases. Diffusely mottled appearance of the osseous structures. Soft tissues are grossly normal. IMPRESSION: 1. Enlarged cardiac silhouette. 2. Increased interstitial markings with additional peribronchovascular airspace consolidation in the lung bases may represent mixed pattern pulmonary edema or developing multifocal pneumonia. 3. Diffusely mottled appearance of the osseous structures, likely due to diffuse osseous metastatic disease or myeloproliferative disorder. Electronically Signed   By: Fidela Salisbury M.D.   On: 07/28/2018 11:21   Ct Head Wo Contrast  Result Date: 07/28/2018 CLINICAL DATA:  Altered level of consciousness EXAM: CT HEAD WITHOUT CONTRAST TECHNIQUE: Contiguous axial images were obtained from the base of the skull through the vertex without intravenous contrast. COMPARISON:  Two days ago FINDINGS: Brain: Apparent low-density in the right occipital region is attributed to streak artifact. There is no edema, consolidation, effusion, or pneumothorax. Mild small vessel ischemic gliosis in the periventricular white matter. Mild cerebral volume loss. Vascular: No hyperdense vessel or unexpected calcification. Skull: Normal. Negative for fracture or focal lesion. Sinuses/Orbits: Negative IMPRESSION: No acute finding or change from prior. Electronically Signed   By: Monte Fantasia M.D.   On: 07/28/2018 07:51   Ct Head Wo Contrast  Result Date: 07/26/2018 CLINICAL DATA:  78 year old male with RIGHT UPPER extremity weakness and generalized muscle weakness. EXAM: CT HEAD WITHOUT CONTRAST TECHNIQUE: Contiguous  axial images were obtained from the base of the skull through the vertex without intravenous contrast. COMPARISON:  08/18/2017 and prior CTs FINDINGS: Brain: No evidence of acute infarction, hemorrhage, hydrocephalus, extra-axial collection or mass lesion/mass effect. Mild atrophy and chronic small-vessel white matter ischemic changes noted. Vascular: No hyperdense vessel or unexpected calcification. Skull: Normal. Negative for fracture  or focal lesion. Sinuses/Orbits: No acute finding. Other: None. IMPRESSION: 1. No evidence of acute intracranial abnormality 2. Mild atrophy and chronic small-vessel white matter ischemic changes. Electronically Signed   By: Margarette Canada M.D.   On: 07/26/2018 13:46   US Renal  Result Date: 07/26/2018 CLINICAL DATA:  78 year old male for follow-up of hydronephrosis. EXAM: RENAL / URINARY TRACT ULTRASOUND COMPLETE COMPARISON:  08/09/2018 CT FINDINGS: Right Kidney: Renal measurements: 11.1 x 4.9 x 5.9 cm = volume: 165 mL. Increased echogenicity noted. No mass or hydronephrosis visualized. Left Kidney: Renal measurements: 12.4 x 6.6 x 6.7 cm = volume: 288 mL. Increased echogenicity noted. No mass or hydronephrosis visualized. Bladder: A Foley catheter within the bladder is noted. IMPRESSION: 1. Resolution of bilateral hydronephrosis since 08/05/2018. 2. Increased renal echogenicity within both kidneys compatible with medical renal disease. 3. Foley catheter within the bladder. Electronically Signed   By: Margarette Canada M.D.   On: 07/26/2018 21:01        Scheduled Meds: . B-complex with vitamin C  1 tablet Oral Daily  . clotrimazole  1 application Topical BID  . heparin  5,000 Units Subcutaneous Q8H  . hydrocortisone cream  1 application Topical BID  . mouth rinse  15 mL Mouth Rinse BID   Continuous Infusions: . sodium chloride Stopped (07/24/18 1137)  . sodium chloride 150 mL/hr at 07/28/18 0800  . ceFEPime (MAXIPIME) IV 2 g (07/28/18 1021)     LOS: 4 days       Georgette Shell, MD [facility]  If 7PM-7AM, please contact night-coverage [email or web address] Password [email or web address] 07/28/2018, 11:42 AM

## 2018-07-28 NOTE — Progress Notes (Signed)
Offsite EEG completed, results pending.

## 2018-07-28 NOTE — Procedures (Signed)
History: 78 year old male being evaluated for encephalopathy  Sedation: None  Technique: This is a 21 channel routine scalp EEG performed at the bedside with bipolar and monopolar montages arranged in accordance to the international 10/20 system of electrode placement. One channel was dedicated to EKG recording.    Background: The background is dominated by generalized periodic discharges(GPDs) that are frontally predominant  with triphasic morphology with a frequency of 2 to 3 Hz.  There is some waxing/waning in morphology with varying degrees of the sharpness of phase 1, but no definite evolution.  There is also generalized irregular delta and theta activities throughout the study.  There is a poorly formed and poorly sustained posterior dominant rhythm of 7 to 8 Hz which is seen at times.  Photic stimulation: Physiologic driving is not performed  EEG Abnormalities: 1) GPDs with triphasic morphology and frequency of 2 to 3 Hz 2) generalized irregular slow activity 3) Slow PDR  Clinical Interpretation: This EEG recorded an indeterminate pattern which could be seen with nonconvulsive status epilepticus or with metabolic/toxic encephalopathy.  Of note, triphasic waves of this frequency are seen with cephalosporin-induced neurotoxicity.  I would recommend continuous EEG monitoring for further characterization and response to treatment.  Roland Rack, MD [facility] [phone]  If 7pm- 7am, please page neurology on call as listed in [place].

## 2018-07-28 NOTE — Progress Notes (Signed)
Spoke with RRT about pt's condition, with changes. Suggested to call on call [facility]. K.Schorr NP paged and she returned call. Explained that Juan [name] continues to have fine Tremors. Temp up to 100.1, pulse 104. WBC's continue to trend up, urine still cloudy and "junky", non-verbal, but will make eye contact, turns his head away when offered sips. Flacid arms when lifted , but, pt will move each of them to rub his head or to just change position. Spoke with Lamar Blinks about a repeat, CT. K. Schorr said that she has reviewed Juan [name] chart and will leave a detailed note for Dr. Ivan Croft with our concerns.

## 2018-07-28 NOTE — Progress Notes (Signed)
Patient arrived on unit at 90.  Paged admissions for assistance with MD assessment. Wendee Copp

## 2018-07-29 ENCOUNTER — Other Ambulatory Visit: Payer: Self-pay

## 2018-07-29 ENCOUNTER — Inpatient Hospital Stay (HOSPITAL_COMMUNITY): Payer: Medicare Other

## 2018-07-29 DIAGNOSIS — R000 Tachycardia, unspecified: Secondary | ICD-10-CM

## 2018-07-29 DIAGNOSIS — B965 Pseudomonas (aeruginosa) (mallei) (pseudomallei) as the cause of diseases classified elsewhere: Secondary | ICD-10-CM

## 2018-07-29 DIAGNOSIS — J9601 Acute respiratory failure with hypoxia: Secondary | ICD-10-CM

## 2018-07-29 DIAGNOSIS — G9340 Encephalopathy, unspecified: Secondary | ICD-10-CM

## 2018-07-29 DIAGNOSIS — R569 Unspecified convulsions: Secondary | ICD-10-CM

## 2018-07-29 DIAGNOSIS — J690 Pneumonitis due to inhalation of food and vomit: Secondary | ICD-10-CM

## 2018-07-29 DIAGNOSIS — Z960 Presence of urogenital implants: Secondary | ICD-10-CM

## 2018-07-29 DIAGNOSIS — Z881 Allergy status to other antibiotic agents status: Secondary | ICD-10-CM

## 2018-07-29 DIAGNOSIS — Z9911 Dependence on respirator [ventilator] status: Secondary | ICD-10-CM

## 2018-07-29 DIAGNOSIS — N179 Acute kidney failure, unspecified: Secondary | ICD-10-CM

## 2018-07-29 LAB — POCT I-STAT 7, (LYTES, BLD GAS, ICA,H+H)
Acid-base deficit: 8 mmol/L — ABNORMAL HIGH (ref 0.0–2.0)
Bicarbonate: 17 mmol/L — ABNORMAL LOW (ref 20.0–28.0)
Calcium, Ion: 1.37 mmol/L (ref 1.15–1.40)
HCT: 31 % — ABNORMAL LOW (ref 39.0–52.0)
Hemoglobin: 10.5 g/dL — ABNORMAL LOW (ref 13.0–17.0)
O2 Saturation: 99 %
PO2 ART: 166 mmHg — AB (ref 83.0–108.0)
Patient temperature: 99.2
Potassium: 3.5 mmol/L (ref 3.5–5.1)
Sodium: 147 mmol/L — ABNORMAL HIGH (ref 135–145)
TCO2: 18 mmol/L — ABNORMAL LOW (ref 22–32)
pCO2 arterial: 32.6 mmHg (ref 32.0–48.0)
pH, Arterial: 7.327 — ABNORMAL LOW (ref 7.350–7.450)

## 2018-07-29 LAB — CBC WITH DIFFERENTIAL/PLATELET
Abs Immature Granulocytes: 9.25 10*3/uL — ABNORMAL HIGH (ref 0.00–0.07)
Basophils Absolute: 0.9 10*3/uL — ABNORMAL HIGH (ref 0.0–0.1)
Basophils Relative: 3 %
Eosinophils Absolute: 0.7 10*3/uL — ABNORMAL HIGH (ref 0.0–0.5)
Eosinophils Relative: 2 %
HCT: 29.8 % — ABNORMAL LOW (ref 39.0–52.0)
Hemoglobin: 8.4 g/dL — ABNORMAL LOW (ref 13.0–17.0)
Immature Granulocytes: 29 %
Lymphocytes Relative: 6 %
Lymphs Abs: 2.1 10*3/uL (ref 0.7–4.0)
MCH: 23.9 pg — AB (ref 26.0–34.0)
MCHC: 28.2 g/dL — ABNORMAL LOW (ref 30.0–36.0)
MCV: 84.7 fL (ref 80.0–100.0)
Monocytes Absolute: 6.8 10*3/uL — ABNORMAL HIGH (ref 0.1–1.0)
Monocytes Relative: 21 %
Neutro Abs: 12.2 10*3/uL — ABNORMAL HIGH (ref 1.7–7.7)
Neutrophils Relative %: 39 %
Platelets: 175 10*3/uL (ref 150–400)
RBC: 3.52 MIL/uL — ABNORMAL LOW (ref 4.22–5.81)
RDW: 17.9 % — ABNORMAL HIGH (ref 11.5–15.5)
WBC: 31.9 10*3/uL — ABNORMAL HIGH (ref 4.0–10.5)
nRBC: 0.9 % — ABNORMAL HIGH (ref 0.0–0.2)

## 2018-07-29 LAB — BLOOD GAS, ARTERIAL
Acid-base deficit: 7.5 mmol/L — ABNORMAL HIGH (ref 0.0–2.0)
Bicarbonate: 17.8 mmol/L — ABNORMAL LOW (ref 20.0–28.0)
DRAWN BY: 244901
FIO2: 100
O2 SAT: 99 %
Patient temperature: 99
pCO2 arterial: 39 mmHg (ref 32.0–48.0)
pH, Arterial: 7.284 — ABNORMAL LOW (ref 7.350–7.450)
pO2, Arterial: 205 mmHg — ABNORMAL HIGH (ref 83.0–108.0)

## 2018-07-29 LAB — GLUCOSE, CAPILLARY: Glucose-Capillary: 107 mg/dL — ABNORMAL HIGH (ref 70–99)

## 2018-07-29 LAB — COMPREHENSIVE METABOLIC PANEL
ALT: 13 U/L (ref 0–44)
AST: 24 U/L (ref 15–41)
Albumin: 2 g/dL — ABNORMAL LOW (ref 3.5–5.0)
Alkaline Phosphatase: 88 U/L (ref 38–126)
Anion gap: 11 (ref 5–15)
BUN: 85 mg/dL — ABNORMAL HIGH (ref 8–23)
CO2: 18 mmol/L — ABNORMAL LOW (ref 22–32)
Calcium: 9.4 mg/dL (ref 8.9–10.3)
Chloride: 116 mmol/L — ABNORMAL HIGH (ref 98–111)
Creatinine, Ser: 2.39 mg/dL — ABNORMAL HIGH (ref 0.61–1.24)
GFR calc non Af Amer: 25 mL/min — ABNORMAL LOW (ref >60)
GFR, EST AFRICAN AMERICAN: 29 mL/min — AB (ref >60)
Glucose, Bld: 91 mg/dL (ref 70–99)
POTASSIUM: 3.8 mmol/L (ref 3.5–5.1)
Sodium: 145 mmol/L (ref 135–145)
Total Bilirubin: 0.8 mg/dL (ref 0.3–1.2)
Total Protein: 5.5 g/dL — ABNORMAL LOW (ref 6.5–8.1)

## 2018-07-29 LAB — LACTIC ACID, PLASMA
Lactic Acid, Venous: 0.5 mmol/L (ref 0.5–1.9)
Lactic Acid, Venous: 0.7 mmol/L (ref 0.5–1.9)

## 2018-07-29 LAB — CULTURE, BLOOD (ROUTINE X 2)
Culture: NO GROWTH
Culture: NO GROWTH
SPECIAL REQUESTS: ADEQUATE
Special Requests: ADEQUATE

## 2018-07-29 LAB — PROCALCITONIN: Procalcitonin: 2.52 ng/mL

## 2018-07-29 MED ORDER — PIPERACILLIN-TAZOBACTAM 3.375 G IVPB
3.3750 g | Freq: Three times a day (TID) | INTRAVENOUS | Status: AC
  Administered 2018-07-29 – 2018-07-30 (×4): 3.375 g via INTRAVENOUS
  Filled 2018-07-29 (×4): qty 50

## 2018-07-29 MED ORDER — CHLORHEXIDINE GLUCONATE 0.12% ORAL RINSE (MEDLINE KIT)
15.0000 mL | Freq: Two times a day (BID) | OROMUCOSAL | Status: DC
  Administered 2018-07-29 – 2018-08-20 (×43): 15 mL via OROMUCOSAL

## 2018-07-29 MED ORDER — LORAZEPAM 2 MG/ML IJ SOLN
2.0000 mg | Freq: Once | INTRAMUSCULAR | Status: AC
  Administered 2018-07-29: 2 mg via INTRAVENOUS
  Filled 2018-07-29: qty 1

## 2018-07-29 MED ORDER — DEXTROSE IN LACTATED RINGERS 5 % IV SOLN
INTRAVENOUS | Status: DC
  Administered 2018-07-29: 15:00:00 via INTRAVENOUS

## 2018-07-29 MED ORDER — FENTANYL CITRATE (PF) 100 MCG/2ML IJ SOLN
50.0000 ug | INTRAMUSCULAR | Status: DC | PRN
  Filled 2018-07-29 (×2): qty 2

## 2018-07-29 MED ORDER — MIDAZOLAM HCL 2 MG/2ML IJ SOLN
2.0000 mg | Freq: Once | INTRAMUSCULAR | Status: AC
  Administered 2018-07-29: 2 mg via INTRAVENOUS

## 2018-07-29 MED ORDER — MIDAZOLAM 50MG/50ML (1MG/ML) PREMIX INFUSION
5.0000 mg/h | INTRAVENOUS | Status: DC
  Administered 2018-07-29 – 2018-07-30 (×3): 5 mg/h via INTRAVENOUS
  Filled 2018-07-29 (×3): qty 50

## 2018-07-29 MED ORDER — LORAZEPAM 2 MG/ML IJ SOLN
1.0000 mg | Freq: Once | INTRAMUSCULAR | Status: AC
  Administered 2018-07-29: 1 mg via INTRAVENOUS
  Filled 2018-07-29: qty 1

## 2018-07-29 MED ORDER — FAMOTIDINE 20 MG IN NS 100 ML IVPB
20.0000 mg | Freq: Two times a day (BID) | INTRAVENOUS | Status: DC
  Administered 2018-07-29 – 2018-07-30 (×3): 20 mg via INTRAVENOUS
  Filled 2018-07-29 (×3): qty 100

## 2018-07-29 MED ORDER — FENTANYL CITRATE (PF) 100 MCG/2ML IJ SOLN
50.0000 ug | INTRAMUSCULAR | Status: DC | PRN
  Administered 2018-07-30 – 2018-08-11 (×6): 50 ug via INTRAVENOUS
  Filled 2018-07-29 (×4): qty 2

## 2018-07-29 MED ORDER — DEXTROSE-NACL 5-0.9 % IV SOLN
INTRAVENOUS | Status: DC
  Administered 2018-07-30 – 2018-08-01 (×2): via INTRAVENOUS

## 2018-07-29 MED ORDER — ORAL CARE MOUTH RINSE
15.0000 mL | OROMUCOSAL | Status: DC
  Administered 2018-07-29 – 2018-08-20 (×218): 15 mL via OROMUCOSAL

## 2018-07-29 MED ORDER — FENTANYL CITRATE (PF) 100 MCG/2ML IJ SOLN
INTRAMUSCULAR | Status: AC
  Filled 2018-07-29: qty 2

## 2018-07-29 MED ORDER — MIDAZOLAM HCL 2 MG/2ML IJ SOLN
INTRAMUSCULAR | Status: AC
  Filled 2018-07-29: qty 4

## 2018-07-29 MED ORDER — ETOMIDATE 2 MG/ML IV SOLN
20.0000 mg | Freq: Once | INTRAVENOUS | Status: AC
  Administered 2018-07-29: 20 mg via INTRAVENOUS

## 2018-07-29 MED ORDER — SODIUM CHLORIDE 0.9 % IV BOLUS
500.0000 mL | Freq: Once | INTRAVENOUS | Status: AC
  Administered 2018-07-29: 500 mL via INTRAVENOUS

## 2018-07-29 MED ORDER — ROCURONIUM BROMIDE 50 MG/5ML IV SOLN
50.0000 mg | Freq: Once | INTRAVENOUS | Status: AC
  Administered 2018-07-29: 50 mg via INTRAVENOUS
  Filled 2018-07-29: qty 5

## 2018-07-29 MED ORDER — DEXTROSE-NACL 5-0.9 % IV SOLN
INTRAVENOUS | Status: DC
  Administered 2018-07-29: 12:00:00 via INTRAVENOUS

## 2018-07-29 MED ORDER — LINEZOLID 600 MG/300ML IV SOLN
600.0000 mg | Freq: Two times a day (BID) | INTRAVENOUS | Status: DC
  Administered 2018-07-29 – 2018-08-01 (×6): 600 mg via INTRAVENOUS
  Filled 2018-07-29 (×7): qty 300

## 2018-07-29 MED ORDER — FENTANYL CITRATE (PF) 100 MCG/2ML IJ SOLN
100.0000 ug | Freq: Once | INTRAMUSCULAR | Status: AC
  Administered 2018-07-29: 100 ug via INTRAVENOUS

## 2018-07-29 NOTE — Progress Notes (Signed)
1458 Ativan given per Dr.Lindzen's order.

## 2018-07-29 NOTE — Progress Notes (Signed)
EEG reviewed again by Dr. Leonel Ramsay at the bedside. While there were some triphasics, but fulminantly so, LUE twitching of low amplitude was seen about twice per second.   The twitches may represent clinical seizure activity versus myoclonus.   Starting Versed gtt at 5 mg/hr. Will reassess EEG and clinically.   Electronically signed: Dr. Kerney Elbe

## 2018-07-29 NOTE — Progress Notes (Signed)
LTM EEG started. Event button tested. Nurse educated.

## 2018-07-29 NOTE — Procedures (Signed)
OGT Placement By MD  OGT placed under direct laryngoscopy and verified by auscultation.  Wesam G. Yacoub, M.D. [facility]. Pager: [phone]. After hours pager: [phone]. 

## 2018-07-29 NOTE — Progress Notes (Signed)
PROGRESS NOTE    Juan [name]  UXL:[id number] DOB: 04-Aug-1940 DOA: 07/26/2018 PCP: Colon Branch, MD   Brief Narrative: As per prior attending Dr Rodena Piety: 78 y.o.malewith medical history significant ofhypertension, diet-controlled diabetes, chronic respiratory failure on 2 L oxygen, ascending aortic aneurysm, CAD,dCHF, CKD 3, iron deficiency anemia, OSA not on CPAP, myeloproliferative disease, splenic infarct, hydronephrosis, recurrentpseudomonal UTI who presents with dysuria and back pain.  WBC 26.8, positive urinalysis.TRH asked to admit. Admitted for complicated UTI. Hospital course complicated by lower urinary tract symptoms for which he was started on Pyridium with improvement of his symptoms. Due to bilateral hydronephrosis and urinary retention a Foley catheter was placed in on 07/25/2018 and Dr. Louis Meckel was called for consultation.  Patient was being managed for pseudomonas UTI, metabolic encephalopathy and had worsening mental status as WL 3/12, seen by neurology, EEG was suspicious for seizure, seen by  ID, underwent MRI brain,  MRI L spine and LP 3/12- had worsening hypoxia, placed on NRB, had lasix iv, transferred to ICU to [facility]. Subsequently patient was sent to [facility] for continuous EEG in progressive unit.   3/13 ZDG:[id number] responding , only to pain, on NRB.  Subjective: Lethargic, withdraws to pain, intermittently opens eyes. Son at bedside. Patient is on NRB saturating at 100% on continuous pulse oximetry.  Assessment & Plan:   Complicated UTI  w pseudomonas, in the setting of recurrent UTI, secondary to obstructive uropathy, urine retention.  Currently Foley in place, voiding.  Seen by urology.  And was on cefepime due to concern for neurotoxicity switched to Zosyn 3/12.  Leukocytosis uptrending-does have MDS history w leucocuytosis. Ordered lactic acid, procalcitonin is elevated  Acute encephalopathy w worsening mental status: Multifactorial in the setting of complicated  UTI, seen by neurology concern for possible non-convulsive status epilepticus  and is on continuous EEG. S/p LP MRI brain, MRI L spine: So far unremarkable.  Patient is protecting airway however only withdrawing to pain, on nonrebreather.  He is on continuous EEG, concern for seizures so he is placed on Keppra 500 bid after 2.5 gm load, neurology following. Due to his mental status, notified critical care and will be transferred to ICU service.  Follow up on blood culture, urine culture and CSF culture.  Acute hypoxic respiratory failure: ABG with metabolic acidosis, continuous pulse ox with nonrebreather, continue the same, again discussed with critical care and transfer to ICU service.  X-ray reviewed with bilateral pulm infiltrates.  MRI w/ 10 mm solid lesion at the upper pole of the right kidney, that was not well seen on recent CT scans. Small renal cell carcinoma is not excluded. Radiology recommends dedicated MRI of the kidneys as the patient's condition allows.  Mixed metabolic acidosis: Check ABG, add gentle IV fluids given his AKI on CKD.  LUE weakness POA: currently not following any commands.  CAD: No chest pain on aspirin.  Hypertension: blood pressure currently stable. Cont prn meds  AKI on CKD stage 3: creat on admit was 1.3. Creat overnight worsened slightly, added iv fluids a d5ns, also he is npo Recent Labs  Lab 07/27/18 0833 07/27/18 1906 07/28/18 0355 07/28/18 1625 07/29/18 0933  CREATININE 2.12* 2.13* 2.02* 1.98* 2.39*   Iron deficiency anemia due to chronic blood loss : Hemoglobin is stable.  No overt signs of bleeding.  Monitor.  Resume iron supplement when able to take.  Moderate to severe pulmonary hypertension: supportive care OSA not using CPAP.  Myelodysplastic syndrome: w chronic leukocytosis, most recent in  03/2018 to jan 2020 was 22k-31.4k.  Discussed w PCCM  DVT prophylaxis: Heparin Code Status: Code Family Communication: family at  bedside Disposition Plan: remains inpatient pending clinical improvement.  Prognosis is guarded.  Discussed with the son. Transfer to ICU.  Consultants:  PCCM ID NEUROLOGY UROLOGY  Procedures:  3/12 Fluoro-guided LP performed at L3-L4.  Opening pressure 12 cm H20.  13.5 ml clear CSF obtained  MRI brain 3/12: Truncated and motion degraded examination without visible acute abnormality.  MRI L spine 3/12: 1. Mild foraminal narrowing bilaterally at L4-5 is worse on the left. 2. Chronic endplate changes at D5-3 with associated Schmorl's node. 3. Minimal rightward disc bulging without significant stenosis. 4. 10 mm solid lesion at the upper pole of the right kidney was not well seen on recent CT scans. Small renal cell carcinoma is not excluded. Recommend dedicated MRI of the kidneys as the patient's condition allows. 5. Stable cyst at the lower pole of the right kidney.  US abdomen 3/12: 1. No acute findings. 2. Single visualized gallstone. No evidence of acute cholecystitis. No other abnormality.  Antimicrobials: Anti-infectives (From admission, onward)   Start     Dose/Rate Route Frequency Ordered Stop   07/28/18 2000  piperacillin-tazobactam (ZOSYN) IVPB 3.375 g     3.375 g 12.5 mL/hr over 240 Minutes Intravenous Every 8 hours 07/28/18 1546 07/30/18 2359   07/24/18 0300  ceFEPIme (MAXIPIME) 2 g in sodium chloride 0.9 % 100 mL IVPB  Status:  Discontinued     2 g 200 mL/hr over 30 Minutes Intravenous 2 times daily 07/24/18 0039 07/28/18 1250   07/28/2018 2200  cefTRIAXone (ROCEPHIN) 1 g in sodium chloride 0.9 % 100 mL IVPB     1 g 200 mL/hr over 30 Minutes Intravenous  Once 08/14/2018 2154 08/02/2018 2236       Objective: Vitals:   07/28/18 2015 07/29/18 0025 07/29/18 0356 07/29/18 0825  BP: (!) 141/67 132/66 133/70 130/71  Pulse: (!) 111 98 (!) 101 (!) 105  Resp: 20 18 17  (!) 22  Temp: 98.4 F (36.9 C) 99.2 F (37.3 C) 99 F (37.2 C) 99 F (37.2 C)  TempSrc:  Axillary Axillary Axillary Axillary  SpO2: 100%  100% 100%  Weight:      Height:        Intake/Output Summary (Last 24 hours) at 07/29/2018 1127 Last data filed at 07/29/2018 0600 Gross per 24 hour  Intake 915.92 ml  Output 1925 ml  Net -1009.08 ml   Filed Weights   07/24/18 0000 07/24/18 0155  Weight: 92.5 kg 82.7 kg   Weight change:   Body mass index is 22.79 kg/m.  Intake/Output from previous day: 03/12 0701 - 03/13 0700 In: 915.9 [I.V.:720.5; IV Piggyback:195.4] Out: 2325 [Urine:2325] Intake/Output this shift: No intake/output data recorded.  Examination:  General exam: Appears calm and comfortable,Not in distress, older fore the age HEENT:PERRL,Oral mucosa moist, Ear/Nose normal on gross exam Respiratory system: Bilateral equal air entry, normal vesicular breath sounds, no wheezes or crackles  Cardiovascular system: S1 & S2 heard,No JVD, murmurs. Gastrointestinal system: Abdomen is  soft, non tender, non distended, BS +  Nervous System:Alert and oriented. No focal neurological deficits/moving extremities, sensation intact. Extremities: No edema, no clubbing, distal peripheral pulses palpable. Skin: No rashes, lesions, no icterus MSK: Normal muscle bulk,tone ,power  Medications:  Scheduled Meds:  chlorhexidine  15 mL Mouth Rinse BID   clotrimazole  1 application Topical BID   famotidine (PEPCID) IV  20 mg Intravenous  Q12H   heparin  5,000 Units Subcutaneous Q8H   hydrocortisone cream  1 application Topical BID   mouth rinse  15 mL Mouth Rinse BID   mouth rinse  15 mL Mouth Rinse q12n4p   Continuous Infusions:  sodium chloride Stopped (07/24/18 1137)   dextrose 5 % and 0.9% NaCl     levETIRAcetam 500 mg (07/29/18 1003)   piperacillin-tazobactam (ZOSYN)  IV 3.375 g (07/29/18 0511)    Data Reviewed: I have personally reviewed following labs and imaging studies  CBC: Recent Labs  Lab 08/11/2018 2048  07/26/18 0724 07/27/18 1906 07/28/18 0355  07/28/18 1625 07/29/18 0933  WBC 26.8*   < > 22.8* 25.1* 27.9* 32.3* 31.9*  NEUTROABS 11.9*  --   --   --   --   --  PENDING  HGB 10.3*   < > 9.4* 8.8* 8.4* 8.9* 8.4*  HCT 35.5*   < > 32.6* 30.0* 29.0* 32.2* 29.8*  MCV 86.6   < > 86.7 86.0 86.1 87.5 84.7  PLT 165   < > 129* 136* 141* 156 175   < > = values in this interval not displayed.   Basic Metabolic Panel: Recent Labs  Lab 07/24/18 0456 07/25/18 0650  07/27/18 0833 07/27/18 1906 07/28/18 0355 07/28/18 1625 07/29/18 0933  NA 141 137   < > 135 138 141 144 145  K 3.5 3.6   < > 3.9 3.9 4.0 3.8 3.8  CL 109 104   < > 102 107 110 113* 116*  CO2 23 22   < > 17* 17* 17* 19* 18*  GLUCOSE 111* 120*   < > 104* 99 103* 97 91  BUN 64* 64*   < > 83* 86* 89* 87* 85*  CREATININE 1.35* 1.71*   < > 2.12* 2.13* 2.02* 1.98* 2.39*  CALCIUM 9.5 9.7   < > 9.3 9.1 9.2 9.5 9.4  MG 2.4 2.4  --   --   --   --   --   --    < > = values in this interval not displayed.   GFR: Estimated Creatinine Clearance: 30.8 mL/min (A) (by C-G formula based on SCr of 2.39 mg/dL (H)). Liver Function Tests: Recent Labs  Lab 07/22/2018 2048 07/27/18 1906 07/28/18 0355 07/28/18 1625 07/29/18 0933  AST 23 23 26 26 24   ALT 19 13 14 13 13   ALKPHOS 110 84 81 90 88  BILITOT 0.5 1.0 0.8 0.9 0.8  PROT 6.7 6.1* 6.2* 6.2* 5.5*  ALBUMIN 3.4* 2.6* 2.4* 2.5* 2.0*   No results for input(s): LIPASE, AMYLASE in the last 168 hours. Recent Labs  Lab 07/27/18 1209  AMMONIA 18   Coagulation Profile: No results for input(s): INR, PROTIME in the last 168 hours. Cardiac Enzymes: No results for input(s): CKTOTAL, CKMB, CKMBINDEX, TROPONINI in the last 168 hours. BNP (last 3 results) No results for input(s): PROBNP in the last 8760 hours. HbA1C: No results for input(s): HGBA1C in the last 72 hours. CBG: No results for input(s): GLUCAP in the last 168 hours. Lipid Profile: No results for input(s): CHOL, HDL, LDLCALC, TRIG, CHOLHDL, LDLDIRECT in the last 72  hours. Thyroid Function Tests: No results for input(s): TSH, T4TOTAL, FREET4, T3FREE, THYROIDAB in the last 72 hours. Anemia Panel: No results for input(s): VITAMINB12, FOLATE, FERRITIN, TIBC, IRON, RETICCTPCT in the last 72 hours. Sepsis Labs: Recent Labs  Lab 08/03/2018 2048 07/29/18 0933  PROCALCITON  --  2.52  LATICACIDVEN 0.8  --  Recent Results (from the past 240 hour(s))  Urine culture     Status: Abnormal   Collection Time: 08/06/2018  8:17 PM  Result Value Ref Range Status   Specimen Description   Final    URINE, RANDOM Performed at [facility] [address], [place], [place] [address]    Special Requests   Final    NONE Performed at [facility], [place] [address] [place], [place] [address]    Culture >=100,000 COLONIES/mL PSEUDOMONAS AERUGINOSA (A)  Final   Report Status 07/26/2018 FINAL  Final   Organism ID, Bacteria PSEUDOMONAS AERUGINOSA (A)  Final      Susceptibility   Pseudomonas aeruginosa - MIC*    CEFTAZIDIME <=1 SENSITIVE Sensitive     CIPROFLOXACIN >=4 RESISTANT Resistant     GENTAMICIN <=1 SENSITIVE Sensitive     IMIPENEM 2 SENSITIVE Sensitive     PIP/TAZO <=4 SENSITIVE Sensitive     CEFEPIME 4 SENSITIVE Sensitive     * >=100,000 COLONIES/mL PSEUDOMONAS AERUGINOSA  Culture, blood (Routine X 2) w Reflex to ID Panel     Status: None (Preliminary result)   Collection Time: 07/24/18 12:59 AM  Result Value Ref Range Status   Specimen Description   Final    BLOOD RIGHT ARM Performed at [facility], [place] [address], [place], [place] [address]    Special Requests   Final    BOTTLES DRAWN AEROBIC AND ANAEROBIC Blood Culture adequate volume Performed at [facility] [address] [place], [place] [address]    Culture   Final    NO GROWTH 4 DAYS Performed at [facility], [place] [address], [place], [place] [address]    Report Status PENDING  Incomplete  Culture, blood (Routine X 2) w  Reflex to ID Panel     Status: None (Preliminary result)   Collection Time: 07/24/18 12:59 AM  Result Value Ref Range Status   Specimen Description   Final    BLOOD LEFT HAND Performed at [facility] [address] [place], [place] [address]    Special Requests   Final    BOTTLES DRAWN AEROBIC AND ANAEROBIC Blood Culture adequate volume Performed at [facility] [address] [place], [place] [address]    Culture   Final    NO GROWTH 4 DAYS Performed at [facility], [place] [address] [place], [place] [address]    Report Status PENDING  Incomplete  MRSA PCR Screening     Status: None   Collection Time: 07/24/18  4:49 AM  Result Value Ref Range Status   MRSA by PCR NEGATIVE NEGATIVE Final    Comment:        The GeneXpert MRSA Assay (FDA approved for NASAL specimens only), is one component of a comprehensive MRSA colonization surveillance program. It is not intended to diagnose MRSA infection nor to guide or monitor treatment for MRSA infections. Performed at [facility], [place] [address] [place], [place] [address]   Culture, blood (routine x 2)     Status: None (Preliminary result)   Collection Time: 07/28/18  8:54 AM  Result Value Ref Range Status   Specimen Description   Final    BLOOD LEFT HAND Performed at [facility] [address] [place], [place] [address]    Special Requests   Final    BOTTLES DRAWN AEROBIC AND ANAEROBIC Blood Culture adequate volume Performed at  [facility], [place] [address] [place], [place] [address]    Culture   Final    NO GROWTH 1 DAY Performed at [facility], [place] [address] [place], [place] [address]    Report Status PENDING  Incomplete  CSF culture     Status: None (Preliminary result)   Collection Time: 07/28/18  3:10 PM  Result Value Ref Range Status   Specimen Description   Final    CSF Performed at [facility] [address], [place], [place] [address]    Special Requests   Final    NONE Performed at [facility], [place] [address], [place], [place] [address]    Gram Stain   Final    NO WBC SEEN CYTOSPIN SMEAR NO ORGANISMS SEEN Gram Stain Report Called to,Read Back By and Verified With: PENNINGTON,M. RN @1751  ON 03.12.2020 BY COHEN,K GRAM STAIN REVIEWED-AGREE WITH RESULT T. TYSOR    Culture   Final    NO GROWTH < 24 HOURS Performed at [facility], [place] [address] [place], [place] [address]    Report Status PENDING  Incomplete  MRSA PCR Screening     Status: None   Collection Time: 07/28/18  4:04 PM  Result Value Ref Range Status   MRSA by PCR NEGATIVE NEGATIVE Final    Comment:        The GeneXpert MRSA Assay (FDA approved for NASAL specimens only), is one component of a comprehensive MRSA colonization surveillance program. It is not intended to diagnose MRSA infection nor to guide or monitor treatment for MRSA infections. Performed at [facility], [place] [address] [place], [place] [address]       Radiology Studies: Dg Chest 1 View  Result Date: 07/29/2018 CLINICAL DATA:  Hypoxia EXAM: CHEST  1 VIEW COMPARISON:  07/28/2018 FINDINGS: Normal heart size. Chronic asymmetric elevation of the right hemidiaphragm. Bilateral interstitial and airspace opacities are identified. No significant change in aeration a lungs compared with previous exam. 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 IMPRESSION: No change in aeration a lungs compared with previous exam. Electronically Signed   By: Kerby Moors M.D.   On: 07/29/2018 09:39   Dg Chest 1 View  Result Date: 07/28/2018 CLINICAL DATA:  Leukocytosis. Fever. EXAM: CHEST  1 VIEW COMPARISON:  07/29/2018 FINDINGS: Cardiomediastinal silhouette is enlarged. Mediastinal contours appear intact. There is no  evidence of pneumothorax. Chronic elevation of the right hemidiaphragm. Increased interstitial markings with additional peribronchovascular airspace consolidation in the lung bases. Diffusely mottled appearance of the osseous structures. Soft tissues are grossly normal. IMPRESSION: 1. Enlarged cardiac silhouette. 2. Increased interstitial markings with additional peribronchovascular airspace consolidation in the lung bases may represent mixed pattern pulmonary edema or developing multifocal pneumonia. 3. Diffusely mottled appearance of the osseous structures, likely due to diffuse osseous metastatic disease or myeloproliferative disorder. Electronically Signed   By: Fidela Salisbury M.D.   On: 07/28/2018 11:21   Ct Head Wo Contrast  Result Date: 07/28/2018 CLINICAL DATA:  Altered level of consciousness EXAM: CT HEAD WITHOUT CONTRAST TECHNIQUE: Contiguous axial images were obtained from the base of the skull through the vertex without intravenous contrast. COMPARISON:  Two days ago FINDINGS: Brain: Apparent low-density in the right occipital region is attributed to streak artifact. There is no edema, consolidation, effusion, or pneumothorax. Mild small vessel ischemic gliosis in the periventricular white matter. Mild cerebral volume loss. Vascular:  No hyperdense vessel or unexpected calcification. Skull: Normal. Negative for fracture or focal lesion. Sinuses/Orbits: Negative IMPRESSION: No acute finding or change from prior. Electronically Signed   By: Monte Fantasia M.D.   On: 07/28/2018 07:51   Mr Brain Wo Contrast  Result Date: 07/28/2018 CLINICAL DATA:  Encephalopathy EXAM: MRI HEAD WITHOUT CONTRAST TECHNIQUE: Multiplanar, multiecho pulse sequences of the brain and surrounding structures were obtained without intravenous contrast. COMPARISON:  Head CT 07/28/2018 FINDINGS: The examination had to be discontinued prior to completion due to patient altered mental status and inability to cooperate with the  technologist's instructions. Axial DWI, SWI, T2 and FLAIR sequences were obtained. Additionally, the examination is degraded by motion. There is no acute ischemia. There is mild periventricular white matter hyperintensity compatible with chronic small vessel disease. No age advanced atrophy. No intracranial hemorrhage is visible. IMPRESSION: Truncated and motion degraded examination without visible acute abnormality. Electronically Signed   By: Ulyses Jarred M.D.   On: 07/28/2018 22:34   Mr Lumbar Spine Wo Contrast  Result Date: 07/28/2018 CLINICAL DATA:  Back pain.  Cauda equina syndrome suspected. EXAM: MRI LUMBAR SPINE WITHOUT CONTRAST TECHNIQUE: Multiplanar, multisequence MR imaging of the lumbar spine was performed. No intravenous contrast was administered. COMPARISON:  None. FINDINGS: Segmentation: 5 non rib-bearing lumbar type vertebral bodies are present. The lowest fully formed vertebral body is L5. Alignment: Slight retrolisthesis is present at L4-5. AP alignment is otherwise anatomic. There is some straightening of the normal lumbar lordosis. Vertebrae: Chronic endplate marrow changes are present at L4-5 with a Schmorl's node. Marrow signal and vertebral body heights are otherwise normal. Conus medullaris and cauda equina: Conus extends to the L2-3 level. Conus and cauda equina appear normal. Paraspinal and other soft tissues: Limited imaging the abdomen is unremarkable. There is no significant adenopathy. 2.6 cm cyst is present at the lower pole of the right kidney. A solid lesion at the upper pole of the right kidney measures 10 mm, not well seen on the most recent CT scan. Previously noted hydronephrosis is mostly resolved. There is no significant adenopathy. Disc levels: L1-2: Negative. L2-3: Negative. L3-4: Negative. L4-5: There is uncovering of a broad-based disc protrusion. The central canal is patent. Facet hypertrophy contributes to mild foraminal narrowing bilaterally, worse on the left.  L5-S1: Minimal rightward disc bulging is present without significant stenosis. IMPRESSION: 1. Mild foraminal narrowing bilaterally at L4-5 is worse on the left. 2. Chronic endplate changes at K0-9 with associated Schmorl's node. 3. Minimal rightward disc bulging without significant stenosis. 4. 10 mm solid lesion at the upper pole of the right kidney was not well seen on recent CT scans. Small renal cell carcinoma is not excluded. Recommend dedicated MRI of the kidneys as the patient's condition allows. 5. Stable cyst at the lower pole of the right kidney. Electronically Signed   By: San Morelle M.D.   On: 07/28/2018 15:27   US Abdomen Limited Ruq  Result Date: 07/28/2018 CLINICAL DATA:  Fever. Possible sludge seen in the gallbladder on CT. EXAM: ULTRASOUND ABDOMEN LIMITED RIGHT UPPER QUADRANT COMPARISON:  CT, 07/29/2018 FINDINGS: Gallbladder: 7 mm stone in the dependent gallbladder. No wall thickening. No pericholecystic fluid. Common bile duct: Diameter: 3 mm Liver: No focal lesion identified. Within normal limits in parenchymal echogenicity. Portal vein is patent on color Doppler imaging with normal direction of blood flow towards the liver. IMPRESSION: 1. No acute findings. 2. Single visualized gallstone. No evidence of acute cholecystitis. No other abnormality. Electronically Signed  By: Lajean Manes M.D.   On: 07/28/2018 13:50   Dg Fluoro Guide Lumbar Puncture  Result Date: 07/28/2018 Etheleen Mayhew, MD     07/28/2018  3:17 PM Fluoro-guided LP performed at L3-L4.  Opening pressure 12 cm H20.  13.5 ml clear CSF obtained and sent to lab for testing.  Please see dictation in PACS for further details.    LOS: 5 days   Time spent: More than 50% of that time was spent in counseling and/or coordination of care.  Antonieta Pert, MD [facility]  07/29/2018, 11:27 AM

## 2018-07-29 NOTE — Progress Notes (Signed)
[facility] for [facility]  Date of Admission:  08/14/2018     Total days of antibiotics 6         ASSESSMENT/PLAN  Juan [name] has a complicated urinary tract infection with culture positive for Pseduomonas aeruginosa treated with cefepime. Course has been complicated by the development of encephalopathy and worsening mental status with concern for cefepime neurotoxicity. CSF unremarkable and without evidence of meningitis. He has received 6  days of antimicrobial therapy to this point with current antimicrobial regimen of piperacillin-tazobactam. General treatment of 5-7 days is usually sufficient for complicated UTI and will plan to stop antibiotics after today. Undergoing EEG at present and responds only to pain. Per notes neurology and urology are also following.   1. Continue current dose of piperacillin-tazobactam with end date of 3/14.   2. Monitor fever curve and symptoms. 3. Neurology following with continuous EEG 4. Foley management per urology.   Principal Problem:   Complicated UTI (urinary tract infection) Active Problems:   Coronary atherosclerosis   CKD (chronic kidney disease) stage 3, GFR 30-59 ml/min (HCC)   Hydronephrosis   Iron deficiency anemia due to chronic blood loss   Hypokalemia   Moderate to severe pulmonary hypertension (HCC)   Chronic respiratory failure with hypoxia (HCC)   Chronic diastolic CHF (congestive heart failure), NYHA class 2 (HCC)   Myelodysplasia (myelodysplastic syndrome) (HCC)   HTN (hypertension)   Normocytic anemia   Back pain   . chlorhexidine  15 mL Mouth Rinse BID  . clotrimazole  1 application Topical BID  . famotidine (PEPCID) IV  20 mg Intravenous Q12H  . heparin  5,000 Units Subcutaneous Q8H  . hydrocortisone cream  1 application Topical BID  . mouth rinse  15 mL Mouth Rinse BID  . mouth rinse  15 mL Mouth Rinse q12n4p    SUBJECTIVE:   Afebrile overnight with WBC count of 32.3. Transferred to [facility]  overnight. No acute events noted.   Allergies  Allergen Reactions  . Cefepime Other (See Comments)    Seizures     Review of Systems: Review of Systems  Unable to perform ROS: Acuity of condition      OBJECTIVE: Vitals:   07/28/18 2015 07/29/18 0025 07/29/18 0356 07/29/18 0825  BP: (!) 141/67 132/66 133/70 130/71  Pulse: (!) 111 98 (!) 101 (!) 105  Resp: 20 18 17  (!) 22  Temp: 98.4 F (36.9 C) 99.2 F (37.3 C) 99 F (37.2 C) 99 F (37.2 C)  TempSrc: Axillary Axillary Axillary Axillary  SpO2: 100%  100% 100%  Weight:      Height:       Body mass index is 22.79 kg/m.  Physical Exam Constitutional:      General: He is not in acute distress.    Appearance: He is well-developed. He is ill-appearing and toxic-appearing.     Interventions: Face mask in place.  Cardiovascular:     Rate and Rhythm: Regular rhythm. Tachycardia present.     Heart sounds: Normal heart sounds.  Pulmonary:     Effort: Pulmonary effort is normal.     Breath sounds: Normal breath sounds.  Skin:    General: Skin is warm and dry.  Neurological:     Comments: Responds only to painful stimuli.   Psychiatric:        Behavior: Behavior normal.        Thought Content: Thought content normal.        Judgment: Judgment normal.  Lab Results Lab Results  Component Value Date   WBC 32.3 (H) 07/28/2018   HGB 8.9 (L) 07/28/2018   HCT 32.2 (L) 07/28/2018   MCV 87.5 07/28/2018   PLT 156 07/28/2018    Lab Results  Component Value Date   CREATININE 1.98 (H) 07/28/2018   BUN 87 (H) 07/28/2018   NA 144 07/28/2018   K 3.8 07/28/2018   CL 113 (H) 07/28/2018   CO2 19 (L) 07/28/2018    Lab Results  Component Value Date   ALT 13 07/28/2018   AST 26 07/28/2018   ALKPHOS 90 07/28/2018   BILITOT 0.9 07/28/2018     Microbiology: Recent Results (from the past 240 hour(s))  Urine culture     Status: Abnormal   Collection Time: 08/09/2018  8:17 PM  Result Value Ref Range Status   Specimen  Description   Final    URINE, RANDOM Performed at [facility], [place] [address], [place], [place] [address]    Special Requests   Final    NONE Performed at [facility], [place] [address] [place], [place] [address]    Culture >=100,000 COLONIES/mL PSEUDOMONAS AERUGINOSA (A)  Final   Report Status 07/26/2018 FINAL  Final   Organism ID, Bacteria PSEUDOMONAS AERUGINOSA (A)  Final      Susceptibility   Pseudomonas aeruginosa - MIC*    CEFTAZIDIME <=1 SENSITIVE Sensitive     CIPROFLOXACIN >=4 RESISTANT Resistant     GENTAMICIN <=1 SENSITIVE Sensitive     IMIPENEM 2 SENSITIVE Sensitive     PIP/TAZO <=4 SENSITIVE Sensitive     CEFEPIME 4 SENSITIVE Sensitive     * >=100,000 COLONIES/mL PSEUDOMONAS AERUGINOSA  Culture, blood (Routine X 2) w Reflex to ID Panel     Status: None (Preliminary result)   Collection Time: 07/24/18 12:59 AM  Result Value Ref Range Status   Specimen Description   Final    BLOOD RIGHT ARM Performed at [facility], [place] [address], [place], [place] [address]    Special Requests   Final    BOTTLES DRAWN AEROBIC AND ANAEROBIC Blood Culture adequate volume Performed at [place] [address] [place], [place] [address]    Culture   Final    NO GROWTH 4 DAYS Performed at [facility], [place] [address], [place], [place] [address]    Report Status PENDING  Incomplete  Culture, blood (Routine X 2) w Reflex to ID Panel     Status: None (Preliminary result)   Collection Time: 07/24/18 12:59 AM  Result Value Ref Range Status   Specimen Description   Final    BLOOD LEFT HAND Performed at [place] [address], [place], [place] [address]    Special Requests   Final    BOTTLES DRAWN AEROBIC AND ANAEROBIC Blood Culture adequate volume Performed at [place] [address] [place], [place] [address]    Culture   Final    NO GROWTH 4 DAYS Performed at  [facility], [place] [address] [place], [place] [address]    Report Status PENDING  Incomplete  MRSA PCR Screening     Status: None   Collection Time: 07/24/18  4:49 AM  Result Value Ref Range Status   MRSA by PCR NEGATIVE NEGATIVE Final    Comment:        The GeneXpert MRSA Assay (FDA approved for NASAL specimens only), is one component  of a comprehensive MRSA colonization surveillance program. It is not intended to diagnose MRSA infection nor to guide or monitor treatment for MRSA infections. Performed at [facility], [place] [address] [place], [place] [address]   Culture, blood (routine x 2)     Status: None (Preliminary result)   Collection Time: 07/28/18  8:54 AM  Result Value Ref Range Status   Specimen Description   Final    BLOOD LEFT HAND Performed at [place] [address] [place], [place] [address]    Special Requests   Final    BOTTLES DRAWN AEROBIC AND ANAEROBIC Blood Culture adequate volume Performed at [place] [address] [place], [place] [address]    Culture   Final    NO GROWTH 1 DAY Performed at [facility], [place] [address], [place], [place] [address]    Report Status PENDING  Incomplete  CSF culture     Status: None (Preliminary result)   Collection Time: 07/28/18  3:10 PM  Result Value Ref Range Status   Specimen Description   Final    CSF Performed at [place] [address], [place], [place] [address]    Special Requests   Final    NONE Performed at [facility], [place] [address], [place], [place] [address]    Gram Stain   Final    NO WBC SEEN CYTOSPIN SMEAR NO ORGANISMS SEEN Gram Stain Report Called to,Read Back By and Verified With: PENNINGTON,M. RN @1751  ON 03.12.2020 BY COHEN,K GRAM STAIN REVIEWED-AGREE WITH RESULT T. TYSOR Performed at [facility], [address] [place], [place] [address]    Culture  PENDING  Incomplete   Report Status PENDING  Incomplete  MRSA PCR Screening     Status: None   Collection Time: 07/28/18  4:04 PM  Result Value Ref Range Status   MRSA by PCR NEGATIVE NEGATIVE Final    Comment:        The GeneXpert MRSA Assay (FDA approved for NASAL specimens only), is one component of a comprehensive MRSA colonization surveillance program. It is not intended to diagnose MRSA infection nor to guide or monitor treatment for MRSA infections. Performed at [facility], [place] [address] [place], [place] [address]      Terri Piedra, [place] [facility] [phone] Pager  07/29/2018  9:53 AM

## 2018-07-29 NOTE — Progress Notes (Addendum)
NEURO HOSPITALIST PROGRESS NOTE   Subjective: Patient in bed, somnolent, son at bedside. Patient on non- rebreather. No jerking or twitching movements noted. No nystagmus or roving eye movements.   Exam: Vitals:   07/29/18 0356 07/29/18 0825  BP: 133/70 130/71  Pulse: (!) 101 (!) 105  Resp: 17 (!) 22  Temp: 99 F (37.2 C) 99 F (37.2 C)  SpO2: 100% 100%    Physical Exam  HEENT-  Normocephalic, no lesions, without obvious abnormality.  Normal external eye and conjunctiva.   Cardiovascular- S1-S2 audible, pulses palpable throughout   Lungs-no rhonchi or wheezing noted, no excessive working breathing.  Saturations within normal limits on non rebreather Extremities- Warm, dry and intact Skin-warm and dry,intact  Neuro:  Mental Status: Somnolent, open eyes to name calling, but quickly goes back to sleep. Unable to follow commands. Patient only moaned and grimaced, but no real, words spoken today. Patient did try to resist eye opening.  Cranial Nerves: PERRL, actively trying to resist eye opening.  Face appears symmetric. Hearing intact to voice. Motor/Sensory: Patient moans and grimaces to noxious stimuli in all 4 extremities. Patient did not withdraw any extremity from pain. Patient did have spontaneous/ purposeful  movement of BUE. Did not move BLE. Cerebellar: Unable to perform Gait: deferred    Medications:  Scheduled: . chlorhexidine  15 mL Mouth Rinse BID  . clotrimazole  1 application Topical BID  . famotidine (PEPCID) IV  20 mg Intravenous Q12H  . heparin  5,000 Units Subcutaneous Q8H  . hydrocortisone cream  1 application Topical BID  . mouth rinse  15 mL Mouth Rinse BID  . mouth rinse  15 mL Mouth Rinse q12n4p   Continuous: . sodium chloride Stopped (07/24/18 1137)  . dextrose 5 % and 0.9% NaCl    . levETIRAcetam 500 mg (07/29/18 1003)  . piperacillin-tazobactam (ZOSYN)  IV 3.375 g (07/29/18 0511)   WGN:FAOZHY chloride, acetaminophen  **OR** acetaminophen, albuterol, hydrALAZINE, LORazepam, ondansetron **OR** ondansetron (ZOFRAN) IV, promethazine, senna-docusate  Pertinent Labs/Diagnostics: ABG ( on  Nonrebreather): ph: 7.284, p02: 205, Bicarb: 17.8 BUN: 85 co2: 18 Cl: 116 creatinine: 2.39 GFR: 29   EEG 07-28-2018 Clinical Interpretation: This EEG recorded an indeterminate pattern which could be seen with nonconvulsive status epilepticus or with metabolic/toxic encephalopathy.  Of note, triphasic waves of this frequency are seen with cephalosporin-induced neurotoxicity.  I would recommend continuous EEG monitoring for further characterization and response to treatment. Dg Chest 1 View  Result Date: 07/29/2018 CLINICAL DATA:  Hypoxia EXAM: CHEST  1 VIEW COMPARISON:  07/28/2018 FINDINGS: Normal heart size. Chronic asymmetric elevation of the right hemidiaphragm. Bilateral interstitial and airspace opacities are identified. No significant change in aeration a lungs compared with previous exam. 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 IMPRESSION: No change in aeration a lungs compared with previous exam. Electronically Signed   By: Kerby Moors M.D.   On: 07/29/2018 09:39   Dg Chest 1 View  Result Date: 07/28/2018 CLINICAL DATA:  Leukocytosis. Fever. EXAM: CHEST  1 VIEW COMPARISON:  07/19/2018 FINDINGS: Cardiomediastinal silhouette is enlarged. Mediastinal contours appear intact. There is no evidence of pneumothorax. Chronic elevation of the right hemidiaphragm. Increased interstitial markings with additional peribronchovascular airspace consolidation in the lung bases. Diffusely mottled appearance of the osseous structures. Soft tissues are grossly normal. IMPRESSION: 1. Enlarged cardiac silhouette. 2.  Increased interstitial markings with additional peribronchovascular airspace consolidation in the lung bases may represent mixed pattern pulmonary edema or  developing multifocal pneumonia. 3. Diffusely mottled appearance of the osseous structures, likely due to diffuse osseous metastatic disease or myeloproliferative disorder. Electronically Signed   By: Fidela Salisbury M.D.   On: 07/28/2018 11:21   Ct Head Wo Contrast  Result Date: 07/28/2018 CLINICAL DATA:  Altered level of consciousness EXAM: CT HEAD WITHOUT CONTRAST TECHNIQUE: Contiguous axial images were obtained from the base of the skull through the vertex without intravenous contrast. COMPARISON:  Two days ago FINDINGS: Brain: Apparent low-density in the right occipital region is attributed to streak artifact. There is no edema, consolidation, effusion, or pneumothorax. Mild small vessel ischemic gliosis in the periventricular white matter. Mild cerebral volume loss. Vascular: No hyperdense vessel or unexpected calcification. Skull: Normal. Negative for fracture or focal lesion. Sinuses/Orbits: Negative IMPRESSION: No acute finding or change from prior. Electronically Signed   By: Monte Fantasia M.D.   On: 07/28/2018 07:51   Mr Brain Wo Contrast  Result Date: 07/28/2018 CLINICAL DATA:  Encephalopathy EXAM: MRI HEAD WITHOUT CONTRAST TECHNIQUE: Multiplanar, multiecho pulse sequences of the brain and surrounding structures were obtained without intravenous contrast. COMPARISON:  Head CT 07/28/2018 FINDINGS: The examination had to be discontinued prior to completion due to patient altered mental status and inability to cooperate with the technologist's instructions. Axial DWI, SWI, T2 and FLAIR sequences were obtained. Additionally, the examination is degraded by motion. There is no acute ischemia. There is mild periventricular white matter hyperintensity compatible with chronic small vessel disease. No age advanced atrophy. No intracranial hemorrhage is visible. IMPRESSION: Truncated and motion degraded examination without visible acute abnormality. Electronically Signed   By: Ulyses Jarred M.D.   On:  07/28/2018 22:34   Mr Lumbar Spine Wo Contrast  Result Date: 07/28/2018 . IMPRESSION: 1. Mild foraminal narrowing bilaterally at L4-5 is worse on the left. 2. Chronic endplate changes at U7-2 with associated Schmorl's node. 3. Minimal rightward disc bulging without significant stenosis. 4. 10 mm solid lesion at the upper pole of the right kidney was not well seen on recent CT scans. Small renal cell carcinoma is not excluded. Recommend dedicated MRI of the kidneys as the patient's condition allows. 5. Stable cyst at the lower pole of the right kidney.   US Abdomen Limited Ruq Result Date: 07/28/2018 CLINICAL DATA:  Fever. Possible sludge seen in the gallbladder on CT. EXAM: ULTRASOUND ABDOMEN LIMITED RIGHT UPPER QUADRANT COMPARISON:  CT, 07/28/2018 FINDINGS: Gallbladder: 7 mm stone in the dependent gallbladder. No wall thickening. No pericholecystic fluid. Common bile duct: Diameter: 3 mm Liver: No focal lesion identified. Within normal limits in parenchymal echogenicity. Portal vein is patent on color Doppler imaging with normal direction of blood flow towards the liver. IMPRESSION: 1. No acute findings. 2. Single visualized gallstone. No evidence of acute cholecystitis. No other abnormality. Electronically Signed   By: Lajean Manes M.D.   On: 07/28/2018 13:50   Dg Fluoro Guide Lumbar Puncture  Result Date: 07/28/2018 Etheleen Mayhew, MD     07/28/2018  3:17 PM Fluoro-guided LP performed at L3-L4.  Opening pressure 12 cm H20.  13.5 ml clear CSF obtained and sent to lab for testing.  Please see dictation in PACS for further details.   EEG 3/12:  Abnormalities: 1) GPDs with triphasic morphology and frequency of 2 to 3 Hz 2) generalized irregular slow activity 3) Slow PDR Clinical Interpretation: This EEG recorded an indeterminate  pattern which could be seen with nonconvulsive status epilepticus or with metabolic/toxic encephalopathy.  Of note, triphasic waves of this frequency are seen with  cephalosporin-induced neurotoxicity.   LTM EEG report for today: Pending  Impression: 78 year old male with encephalopathy in the setting of pseudomonas complicated UTI as well as high-dose cefepime.   1. The initial EEG was found to be concerning for possible nonconvulsive status epilepticus, but this was felt to be unlikely d/t his degree of interactivity on initial exam.  2. Cefepime was held and patient transferred to [facility] on 07/28/2018.  3. MRI brain showed no acute abnormality. Images were personally reviewed by Dr. Cheral Marker and nothing was seen on MRI that could explain the patient's symptoms.  4. WBC continues to increase; felt most likely to be due to MDS per ID.  5. LTM interpretation: pending 6. LP results: glucose normal, RBC 2 (unremarkable), clear and colorless, protein 19, WBC 2. The CSF findings are not consistent with a meningitis or an encephalitis.    Recommendations: -- Continue Keppra 500 twice daily -- Correct metabolic derangements -- LTM interpretation: pending -- Neurology will continue to follow.   Preliminary LTM read: Generalized, bifrontal predominant triphasics are noted that resolve with sleep-like state, but return with arousal. These findings are most consistent with a toxic/metabolic encephalopathy and less consistent with the triphasics being due to epileptic seizure activity.   Laurey Morale, MSN, NP-C [facility] [phone]  Electronically signed: Dr. Kerney Elbe

## 2018-07-29 NOTE — Progress Notes (Signed)
Pt has moved to 2M09, EEG machine moved and study resumed.

## 2018-07-29 NOTE — Procedures (Signed)
Intubation Procedure Note Juan [name] [id number] [birth year]  Procedure: Intubation Indications: Airway protection and maintenance  Procedure Details Consent: Unable to obtain consent because of emergent medical necessity. Time Out: Verified patient identification, verified procedure, site/side was marked, verified correct patient position, special equipment/implants available, medications/allergies/relevent history reviewed, required imaging and test results available.  Performed  Maximum sterile technique was used including gloves, hand hygiene and mask.  MAC    Evaluation Hemodynamic Status: BP stable throughout; O2 sats: stable throughout Patient's Current Condition: stable Complications: No apparent complications Patient did tolerate procedure well. Chest X-ray ordered to verify placement.  CXR: pending.   Jennet Maduro 07/29/2018

## 2018-07-29 NOTE — Consult Note (Addendum)
NAME:  Juan [name], MRN:  [id number], DOB:  01-Nov-1940, LOS: 5 ADMISSION DATE:  08/16/2018, CONSULTATION DATE:  3/13 REFERRING MD:  Lupita Leash, CHIEF COMPLAINT:  Acute encephalopathy and worsening mental status    Brief History   78 year old male patient admitted initially on 3/7 with sepsis secondary secondary to urinary tract infection with associated acute on chronic renal failure secondary to obstructive uropathy.  Over the course of his hospitalization developed progressive encephalopathy, with concern for nonconvulsive status on 3/12.  This was felt possibly secondary to neurotoxicity from cefepime.  In spite of treating potential seizure stopping antibiotics and continuing supportive care as of 3/13 he remained obtunded and developed worsening hypoxia and work of breathing therefore pulmonary asked to evaluate  History of present illness   78 year old with multiple medical problems initially admitted on 3/7 with chief complaint of Dysuria and back pain.  Reporting pain at level 10 out of 10 in severity in the emergency room was found to have leukocytosis, urinalysis consistent with urinary tract infection and met SIRS criteria.  CT abdomen pelvis showed bilateral hydronephrosis with bladder wall thickening and multiple bladder diverticuli suggesting chronic bladder outlet obstruction with cystitis and urinary tract infection he was admitted to the internal medicine service with a working diagnosis of sepsis secondary to urinary tract source, secondary to obstructive uropathy.  Cultures were obtained and he was started on cefepime.  Urinary cultures came back greater than 100,000 colonies of Cipro resistant Pseudomonas.  On 3/9 he had been started on Pyridium for persistent dysuria.  Diuretics were held due to creatinine bump, Urology was consulted Foley catheter was placed and recommendations from a urology standpoint where that he would require chronic Foley catheter at this point given failed attempts in the  outpatient setting to manage this.  On 3/12 he began Be progressively encephalopathic, CT of head was obtained which was negative, MRI of lumbar spine was obtained to rule out epidural abscess and infectious disease was consulted.  And and lumbar puncture was also ordered, as well as EEG.  EEG showed frequent periodic discharges concerning for triphasic versus epileptiform GPD's and because of this neurology was also consulted and he was transferred to [facility]. New possible seizure-nonconvulsive status it was recommended that cefepime be discontinued given concern for neurotoxicity.  He was subsequently switched from cefepime to Zosyn. On 3/13 noted by nursing staff to have worsening work of breathing with worsening hypoxia and respiratory rate increasing to 20 to 30s.  Rapid response was called.  Chest x-ray was obtained this showed right basilar volume loss and bilateral infiltrates.  Arterial blood gas showed significant metabolic acidosis, he remained lethargic and difficult to arouse and therefore critical care was asked to evaluate  Past Medical History  Chronic urinary retention with prostate hypertrophy, prior Pseudomonas urinary tract infections, stage III chronic kidney disease, moderate to severe pulmonary hypertension, chronic diastolic heart failure, myelodysplasia with myelodysplastic syndrome, chronic back pain  Significant Hospital Events   3/7: Admitted with presumptive diagnosis of urinary tract infection 3/9: worsening renal failure, seen by urology, Foley catheter placed 3/12: Worsening mental status, EEG worrisome for possible nonconvulsive status.  Felt possibly due to neurotoxicity from cefepime this was discontinued and Zosyn was started.  lumbar puncture performed, for possible meningitis.  Infectious disease consulted, neurology consulted, started on Keppra, moved to [facility] for continuous EEG monitoring 3/13 remains unresponsive, developed worsening hypoxia staph from prior LP  unremarkable without evidence of meningitis.  Pulmonary asked to evaluate  Consults:   ID Neurology Urology PCCM  Procedures:   3/12 Fluoro-guided LP performed at L3-L4. Opening pressure 12 cm H20. 13.5 ml clear CSF obtained Significant Diagnostic Tests:  MRI brain 3/12: Truncated and motion degraded examination without visible acute abnormality.  MRI L spine 3/12:1. Mild foraminal narrowing bilaterally at L4-5 is worse on the left.2. Chronic endplate changes at B8-4 with associated Schmorl's node. 3. Minimal rightward disc bulging without significant stenosis.4. 10 mm solid lesion at the upper pole of the right kidney was not well seen on recent CT scans. Small renal cell carcinoma is not excluded. Recommend dedicated MRI of the kidneys as the patient's condition allows. 5. Stable cyst at the lower pole of the right kidney.  US abdomen 3/12: 1. No acute findings. 2. Single visualized gallstone. No evidence of acute cholecystitis. No other abnormality.  Micro Data:  UC 3/7 pseudomonas (cipro resistant) BCx2 3/8>>> BCX2 3/12>>> CSF 3/12: neg resp culture 3/13>>>  Antimicrobials:  Cefepime 3/7>>3/12 Zosyn 3/12>>> zyvox 3/13>>> Interim history/subjective:  Unresponsive even to noxious stimulus  Objective   Blood pressure 113/68, pulse (Abnormal) 103, temperature 97.8 F (36.6 C), temperature source Axillary, resp. rate (Abnormal) 22, height 6' 3" (1.905 m), weight 82.7 kg, SpO2 100 %.        Intake/Output Summary (Last 24 hours) at 07/29/2018 1238 Last data filed at 07/29/2018 0600 Gross per 24 hour  Intake 915.92 ml  Output 1925 ml  Net -1009.08 ml   Filed Weights   07/24/18 0000 07/24/18 0155  Weight: 92.5 kg 82.7 kg    Examination: General: Likely ill malnourished appearing 78 year old male currently unresponsive HENT: Mucous membranes dry neck veins flat Lungs: Clear, mild accessory use Cardiovascular: Rapid tachycardia no murmur rub or  gallop Abdomen: Soft nontender hypoactive bowel sounds Extremities: Warm and dry dependent edema Neuro: No significant response even to noxious stimulus or painful stimulus GU: Clear yellow  Resolved Hospital Problem list    Assessment & Plan:   Acute metabolic encephalopathy.  likely multifactorial: sepsis, possible non-convulsive seizure 2/2 neurotoxicity from cefepime, metabolic acidosis, renal failure and worsening azotemia.  Wonder if he is postictal currently Plan Transfer to intensive care Continue EEG monitoring Continue AEDs Airway protection Treat metabolic derangements Supportive care Minimize sedating medications    Acute hypoxic respiratory failure 2/2 evolving pulmonary infiltrates and atx super imposed on underlying severe PH and OSA  chest x-ray personally reviewed: Is elevated right hemidiaphragm, bilateral infiltrates, suspect hypoxia and work of breathing secondary to possible aspiration and atelectasis -He is not protecting his airway Plan Intubate/ventilate PAD protocol RASS goal 0 VAP bundle Respiratory culture post intubation  Acute on chronic renal failure (st III CKD) 2/2 chronic urinary retention w/ h/o poorly functioning bladder, enlarged prostate and mild to mod obstruction. Has had multiple recurrent UTIs. Has not completed treatment in past d/t financial restraints.  Plan Avoid hypotension Consider central IV access for CVP monitoring Continuing IV fluids Strict intake output Renal dose medications  Fluid, electrolyte and acid base imbalance: hyperchloremia amd Non-anion gap metabolic acidosis Plan Changing IV fluids to lactated Ringer's Serial chemistries  Complicated Pseudomonas Urinary tract infection in setting of obstructive uropathy Plan will need chronic Foley catheter Currently day #7 of 8 antibiotics, had been changed to Zosyn  On-going SIRS/sepsis UT infection +/- new aspiration  Plan Trend cbc and fever curve abx per  above Checking resp culture May need central access  solid lesion on upper pole of right kidney.  Plan Will need specific  MRI if improves.  H/o myelodysplastic syndrome CT abdomen imaging also noting innumerable lytic lesions involving the spine Plan Follow-up oncology outpatient  Iron def anemia Plan Trend cbc Transfuse per protocol  Best practice:  Diet: NPO Pain/Anxiety/Delirium protocol (if indicated): 3/13 VAP protocol (if indicated): 3/13 DVT prophylaxis: [place] heparin  GI prophylaxis: H2B Glucose control: ssi Mobilitity BR Code Status: full code  Family Communication: pending Disposition: Adequately ill requires transfer to the intensive care for airway protection mechanical ventilation continuous EEG monitoring with possible titration of AEDs.  Also in light of worsening renal function may need central access and hemodynamic support  Labs   CBC: Recent Labs  Lab 08/01/2018 2048  07/26/18 0724 07/27/18 1906 07/28/18 0355 07/28/18 1625 07/29/18 0933  WBC 26.8*   < > 22.8* 25.1* 27.9* 32.3* 31.9*  NEUTROABS 11.9*  --   --   --   --   --  12.2*  HGB 10.3*   < > 9.4* 8.8* 8.4* 8.9* 8.4*  HCT 35.5*   < > 32.6* 30.0* 29.0* 32.2* 29.8*  MCV 86.6   < > 86.7 86.0 86.1 87.5 84.7  PLT 165   < > 129* 136* 141* 156 175   < > = values in this interval not displayed.    Basic Metabolic Panel: Recent Labs  Lab 07/24/18 0456 07/25/18 0650  07/27/18 0833 07/27/18 1906 07/28/18 0355 07/28/18 1625 07/29/18 0933  NA 141 137   < > 135 138 141 144 145  K 3.5 3.6   < > 3.9 3.9 4.0 3.8 3.8  CL 109 104   < > 102 107 110 113* 116*  CO2 23 22   < > 17* 17* 17* 19* 18*  GLUCOSE 111* 120*   < > 104* 99 103* 97 91  BUN 64* 64*   < > 83* 86* 89* 87* 85*  CREATININE 1.35* 1.71*   < > 2.12* 2.13* 2.02* 1.98* 2.39*  CALCIUM 9.5 9.7   < > 9.3 9.1 9.2 9.5 9.4  MG 2.4 2.4  --   --   --   --   --   --    < > = values in this interval not displayed.   GFR: Estimated Creatinine  Clearance: 30.8 mL/min (A) (by C-G formula based on SCr of 2.39 mg/dL (H)). Recent Labs  Lab 08/12/2018 2048  07/27/18 1906 07/28/18 0355 07/28/18 1625 07/29/18 0933  PROCALCITON  --   --   --   --   --  2.52  WBC 26.8*   < > 25.1* 27.9* 32.3* 31.9*  LATICACIDVEN 0.8  --   --   --   --   --    < > = values in this interval not displayed.    Liver Function Tests: Recent Labs  Lab 07/26/2018 2048 07/27/18 1906 07/28/18 0355 07/28/18 1625 07/29/18 0933  AST _0 ALT _1 ALKPHOS 110 84 81 90 88  BILITOT 0.5 1.0 0.8 0.9 0.8  PROT 6.7 6.1* 6.2* 6.2* 5.5*  ALBUMIN 3.4* 2.6* 2.4* 2.5* 2.0*   No results for input(s): LIPASE, AMYLASE in the last 168 hours. Recent Labs  Lab 07/27/18 1209  AMMONIA 18    ABG    Component Value Date/Time   PHART 7.284 (L) 07/29/2018 0958   PCO2ART 39.0 07/29/2018 0958   PO2ART 205 (H) 07/29/2018 0958   HCO3 17.8 (L) 07/29/2018 0958   TCO2 29 03/25/2018 0035  ACIDBASEDEF 7.5 (H) 07/29/2018 0958   O2SAT 99.0 07/29/2018 0958     Coagulation Profile: No results for input(s): INR, PROTIME in the last 168 hours.  Cardiac Enzymes: No results for input(s): CKTOTAL, CKMB, CKMBINDEX, TROPONINI in the last 168 hours.  HbA1C: Hgb A1c MFr Bld  Date/Time Value Ref Range Status  08/18/2017 02:47 AM 5.1 4.8 - 5.6 % Final    Comment:    (NOTE) Pre diabetes:          5.7%-6.4% Diabetes:              >6.4% Glycemic control for   <7.0% adults with diabetes   07/28/2016 12:18 PM 5.8 4.6 - 6.5 % Final    Comment:    Glycemic Control Guidelines for People with Diabetes:Non Diabetic:  <6%Goal of Therapy: <7%Additional Action Suggested:  >8%     CBG: No results for input(s): GLUCAP in the last 168 hours.  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   Surgical History    Past Surgical History:  Procedure Laterality Date   FLEXIBLE SIGMOIDOSCOPY N/A 06/08/2017   Procedure: FLEXIBLE SIGMOIDOSCOPY;  Surgeon: Carol Ada, MD;  Location: [facility];  Service: Endoscopy;  Laterality: N/A;   IR FLUORO GUIDE CV LINE RIGHT  05/07/2017   IR FLUORO RM 30-60 MIN  05/07/2017   IR US GUIDE VASC ACCESS RIGHT  05/07/2017   TOE SURGERY Right    "tried to straighten out big toe" (07/11/2013)     Social History   reports that he quit smoking about 52 years ago. His smoking use included cigarettes. He has a 3.00 pack-year smoking history. He has never used smokeless tobacco. He reports that he does not drink alcohol or use drugs.   Family History   His family history includes Mental illness in his daughter; Schizophrenia in his son. There is no history of Colon cancer, Prostate cancer, Heart attack, or Diabetes.   Allergies Allergies  Allergen Reactions   Cefepime Other (See Comments)    Seizures     Home Medications  Prior to Admission medications   Medication Sig Start Date End Date Taking? Authorizing Provider  acetaminophen (TYLENOL) 325 MG tablet Take 2 tablets (650 mg total) by mouth every 6 (six) hours as needed for mild pain (or Fever >/= 101).  05/08/17  Yes Rai, Ripudeep K, MD  aspirin EC 81 MG tablet Take 1 tablet (81 mg total) by mouth daily. 06/27/18  Yes [name], MD  b complex vitamins tablet Take 1 tablet by mouth daily.   Yes [provider]  clotrimazole (LOTRIMIN AF) 1 % cream Apply to Lt foot (between) Toes bid for athletes foot Patient taking differently: Apply 1 application topically 2 (two) times daily. Apply to left foot (between) toes twice daily for athletes foot. 09/12/17  Yes Emokpae, Courage, MD  ferrous gluconate (FERGON) 240 (27 FE) MG tablet Take 240 mg by mouth daily.   Yes [provider]  furosemide (LASIX) 80 MG tablet Take 1 tablet (80 mg total) by mouth 2 (two) times daily. 06/27/18  Yes [name], MD  hydrALAZINE (APRESOLINE) 50 MG tablet Take 1.5 tablets (75 mg total) by mouth 3 (three) times daily. 02/09/18  Yes Bensimhon, Shaune Pascal, MD  hydrocortisone cream 1 % Apply 1 application topically 2 (two) times daily.   Yes [provider]  mouth rinse LIQD solution 15 mLs by Mouth Rinse route 2 (two) times daily. 09/26/17  Yes Shelly Coss, MD  benzonatate (TESSALON) 100 MG capsule Take 1 capsule (100 mg total) by mouth 3 (three) times daily as needed for cough. 03/17/18   Colon Branch, MD  miconazole (MICOTIN) 2 % powder Apply 1 application topically as needed for itching.     [provider]  ondansetron (ZOFRAN) 4 MG tablet Take 1 tablet (4 mg total) by mouth every 6 (six) hours as needed for nausea or vomiting. Patient not taking: Reported on 08/01/2018 09/12/17   Roxan Hockey, MD  OXYGEN Inhale 2 L into the lungs continuous.    [provider]  tamsulosin (FLOMAX) 0.4 MG CAPS capsule Take 1 capsule (0.4 mg total) by mouth daily after supper. Patient taking differently: Take 0.4 mg by mouth daily as needed (urination).  06/13/17   Bonnielee Haff, MD     Critical care time:  27 min    Erick Colace ACNP-BC [place] Pager #  [phone] OR # [phone] if no answer  Attending Note:  78 year old male with extensive PMH who presents to PCCM with respiratory failure due to AMS after receiving cefepime for a pseudomonal UTI.  PCCM was consulted for respiratory failure.  On exam, patient is clearly not protecting his airway and is essentially comatose.  I reviewed CXR myself, hyperinflation noted with infiltrate.  Discussed with PCCM-NP.  Will transfer patient to the ICU.  Intubate for airway protection upon arrival.  Change cefepime to zosyn and zyvox.  F/U on cultures and sensitivities.  Check ABG and CXR and adjust vent to ABG.  PCCM will assume primary.  The patient is critically ill with multiple organ systems failure and requires high complexity decision making for assessment and support, frequent evaluation and titration of therapies, application of advanced monitoring technologies and extensive interpretation of multiple databases.   Critical Care Time devoted to patient care services described in this note is  42  Minutes. This time reflects time of care of this signee Dr Jennet Maduro. This critical care time does not reflect procedure time, or teaching time or supervisory time of PA/NP/Med student/Med Resident etc but could involve care discussion time.  Rush Farmer, M.D. [facility] Pulmonary/Critical Care Medicine. Pager: [phone]. After hours pager: [phone].

## 2018-07-30 DIAGNOSIS — E876 Hypokalemia: Secondary | ICD-10-CM

## 2018-07-30 DIAGNOSIS — N390 Urinary tract infection, site not specified: Secondary | ICD-10-CM

## 2018-07-30 DIAGNOSIS — R579 Shock, unspecified: Secondary | ICD-10-CM

## 2018-07-30 LAB — CBC
HCT: 30.7 % — ABNORMAL LOW (ref 39.0–52.0)
HEMOGLOBIN: 8.6 g/dL — AB (ref 13.0–17.0)
MCH: 24 pg — ABNORMAL LOW (ref 26.0–34.0)
MCHC: 28 g/dL — AB (ref 30.0–36.0)
MCV: 85.5 fL (ref 80.0–100.0)
Platelets: 162 10*3/uL (ref 150–400)
RBC: 3.59 MIL/uL — ABNORMAL LOW (ref 4.22–5.81)
RDW: 18.1 % — ABNORMAL HIGH (ref 11.5–15.5)
WBC: 29.5 10*3/uL — ABNORMAL HIGH (ref 4.0–10.5)
nRBC: 0.7 % — ABNORMAL HIGH (ref 0.0–0.2)

## 2018-07-30 LAB — URINE CULTURE: Culture: >=100000 — AB

## 2018-07-30 LAB — PROCALCITONIN: Procalcitonin: 2.02 ng/mL

## 2018-07-30 LAB — BASIC METABOLIC PANEL
Anion gap: 16 — ABNORMAL HIGH (ref 5–15)
BUN: 85 mg/dL — ABNORMAL HIGH (ref 8–23)
CO2: 16 mmol/L — ABNORMAL LOW (ref 22–32)
Calcium: 9.9 mg/dL (ref 8.9–10.3)
Chloride: 115 mmol/L — ABNORMAL HIGH (ref 98–111)
Creatinine, Ser: 2.64 mg/dL — ABNORMAL HIGH (ref 0.61–1.24)
GFR calc Af Amer: 26 mL/min — ABNORMAL LOW (ref >60)
GFR, EST NON AFRICAN AMERICAN: 23 mL/min — AB (ref >60)
Glucose, Bld: 142 mg/dL — ABNORMAL HIGH (ref 70–99)
POTASSIUM: 3.5 mmol/L (ref 3.5–5.1)
Sodium: 147 mmol/L — ABNORMAL HIGH (ref 135–145)

## 2018-07-30 LAB — GLUCOSE, CAPILLARY
Glucose-Capillary: 113 mg/dL — ABNORMAL HIGH (ref 70–99)
Glucose-Capillary: 98 mg/dL (ref 70–99)
Glucose-Capillary: 111 mg/dL — ABNORMAL HIGH (ref 70–99)
Glucose-Capillary: 126 mg/dL — ABNORMAL HIGH (ref 70–99)

## 2018-07-30 LAB — BRAIN NATRIURETIC PEPTIDE: B NATRIURETIC PEPTIDE 5: 44.9 pg/mL (ref 0.0–100.0)

## 2018-07-30 MED ORDER — FAMOTIDINE 20 MG IN NS 100 ML IVPB
20.0000 mg | INTRAVENOUS | Status: DC
  Administered 2018-07-31 – 2018-08-02 (×3): 20 mg via INTRAVENOUS
  Filled 2018-07-30 (×4): qty 100

## 2018-07-30 MED ORDER — VALPROATE SODIUM 500 MG/5ML IV SOLN
20.0000 mg/kg | Freq: Once | INTRAVENOUS | Status: AC
  Administered 2018-07-30: 1660 mg via INTRAVENOUS
  Filled 2018-07-30: qty 16.6

## 2018-07-30 MED ORDER — VALPROATE SODIUM 500 MG/5ML IV SOLN
20.0000 mg/kg | Freq: Once | INTRAVENOUS | Status: AC
  Administered 2018-07-31: 1660 mg via INTRAVENOUS
  Filled 2018-07-30: qty 16.6

## 2018-07-30 MED ORDER — VALPROATE SODIUM 500 MG/5ML IV SOLN
30.0000 mg/kg/d | Freq: Three times a day (TID) | INTRAVENOUS | Status: DC
  Administered 2018-07-31 – 2018-08-02 (×7): 830 mg via INTRAVENOUS
  Filled 2018-07-30 (×8): qty 8.3

## 2018-07-30 MED ORDER — VALPROATE SODIUM 500 MG/5ML IV SOLN
15.0000 mg/kg/d | Freq: Three times a day (TID) | INTRAVENOUS | Status: DC
  Filled 2018-07-30 (×2): qty 4.15

## 2018-07-30 MED ORDER — MIDAZOLAM 50MG/50ML (1MG/ML) PREMIX INFUSION
5.0000 mg/h | INTRAVENOUS | Status: DC
  Administered 2018-07-30 – 2018-07-31 (×3): 5 mg/h via INTRAVENOUS
  Filled 2018-07-30 (×2): qty 50

## 2018-07-30 MED ORDER — MIDAZOLAM BOLUS VIA INFUSION
5.0000 mg | Freq: Once | INTRAVENOUS | Status: AC
  Administered 2018-07-30: 5 mg via INTRAVENOUS
  Filled 2018-07-30: qty 5

## 2018-07-30 NOTE — Progress Notes (Signed)
Initial Nutrition Assessment  DOCUMENTATION CODES:   Not applicable  INTERVENTION:   If unable to extubate within the next 24 hours, recommend begin TF:  Vital AF 1.2 at 60 ml/h (1440 ml per day)  Pro-stat 30 ml BID  Provides 1928 kcal, 138 gm protein, 1168 ml free water daily  NUTRITION DIAGNOSIS:   Inadequate oral intake related to inability to eat as evidenced by NPO status.  GOAL:   Patient will meet greater than or equal to 90% of their needs  MONITOR:   Vent status, Labs, Skin, I & O's  REASON FOR ASSESSMENT:   Ventilator    ASSESSMENT:   78 yo male with PMH of HTN, CAD, DM-2, myeloproliferative disease, CHF, iron deficiency, CKD who was admitted on 3/8 with complicated UTI. Transferred to the ICU and intubated on 3/13 for acute encephalopathy and worsening mental status.  Currently receiving EEG. 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  Patient is currently intubated on ventilator support MV: 10.4 L/min Temp (24hrs), Avg:98.6 F (37 C), Min:98.1 F (36.7 C), Max:99.2 F (37.3 C)    Labs reviewed. Sodium 147 (H), BUN 85 (H), creatinine 2.64 (H) Medications reviewed.  Prior to intubation, patient was on a heart healthy diet with very poor intake; only consuming 0-25% of meals.    NUTRITION - FOCUSED PHYSICAL EXAM:    Most Recent Value  Orbital Region  Moderate depletion  Upper Arm Region  No depletion  Thoracic and Lumbar Region  No depletion  Buccal Region  Unable to assess  Temple Region  Moderate depletion  Clavicle Bone Region  Moderate depletion  Clavicle and Acromion Bone Region  Moderate depletion  Scapular Bone Region  Unable to assess  Dorsal Hand  No depletion  Patellar Region  Mild depletion  Anterior Thigh Region  Mild depletion  Posterior Calf Region  Moderate depletion  Edema (RD Assessment)  Mild  Hair  Unable to assess  Eyes  Unable to assess  Mouth  Unable to assess  Skin  Reviewed  Nails  Reviewed        Diet Order:   Diet Order            Diet NPO time specified  Diet effective now              EDUCATION NEEDS:   No education needs have been identified at this time  Skin:  Skin Assessment: Reviewed RN Assessment  Last BM:  3/12  Height:   Ht Readings from Last 1 Encounters:  07/29/18 6\' 3"  (1.905 m)    Weight:   Wt Readings from Last 1 Encounters:  07/30/18 83 kg    Ideal Body Weight:  89.1 kg  BMI:  Body mass index is 22.87 kg/m.  Estimated Nutritional Needs:   Kcal:  1925  Protein:  120-140 gm  Fluid:  2 L    Molli Barrows, RD, LDN, [place] Pager [phone] After Hours Pager [phone]

## 2018-07-30 NOTE — Progress Notes (Signed)
Patient reassessed at 8:40 PM.   LTM EEG with diffuse slowing. No electrographic seizures seen.   Exam reveals semi-rhythmic intermittent twitching of left sternocleidomastoid. Less frequently, there is intermittent low amplitude twitching of patient's left shoulder. Also with one 2 second episode of LUE movement during which forearm was raised 1 inch of the bed with 2-3 low amplitude twitches.   A/R: 78 year old male with encephalopathy and epileptic seizure activity in the setting ofpseudomonas complicated UTI as well ashigh-dose cefepime. 1. Recrudescence of left sided twitching after discontinuation of Versed gtt.  2. Bolusing with Versed 5 mg, followed by resumption of gtt at 5 mg/hr 3. Supplemental load of VPA 20 mg/kg, followed by increased scheduled dose to 10 mg/kg IV TID.   Electronically signed: Dr. Kerney Elbe

## 2018-07-30 NOTE — Procedures (Signed)
LTM-EEG Report  HISTORY: Continuous video-EEG monitoring performed for 78 year old with sepsis, myoclonus.  ACQUISITION: International 10-20 system for electrode placement; 18 channels with additional eyes linked to ipsilateral ears and EKG. Additional T1-T2 electrodes were used. Continuous video recording obtained.   EEG NUMBER:  MEDICATIONS:  Day 1: see EMR   DAY #1: from 0910 07/29/18 to 0730 07/30/18   BACKGROUND: An overall medium voltage continuous recording with poor spontaneous variability and reactivity. The background consisted of medium voltage 1-5Hz  activity diffusely without evidence of a posterior dominant rhythm. State changes were seen but without sleep architecture. Less background was observed in the evening as sedation was increased.   EPILEPTIFORM/PERIODIC ACTIVITY: There were initially continuous 2-3hz  sharp GPDs with discrete ictal evolution but with some reported myoclonic jerking. These GPDs resolved in the afternoon with increased sedation.  SEIZURES: Possible subclinical seizures with some myoclonic jerking and rapid GPDs. These resolved in the afternoon.  EVENTS: myoclonus with GPDs as above  EKG: no significant arrhythmia  SUMMARY: This was a markedly abnormal continuous video EEG due to rapid GPDs and diffuse background slowing. 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 The GPDs resolved with increased sedation.

## 2018-07-30 NOTE — Progress Notes (Signed)
NAME:  Juan [name], MRN:  [id number], DOB:  03/03/1941, LOS: 6 ADMISSION DATE:  08/05/2018, CONSULTATION DATE:  3/13 REFERRING MD:  Lupita Leash, CHIEF COMPLAINT:  Acute encephalopathy and worsening mental status    Brief History   78 year old male patient admitted initially on 3/7 with sepsis secondary secondary to urinary tract infection with associated acute on chronic renal failure secondary to obstructive uropathy.  Over the course of his hospitalization developed progressive encephalopathy, with concern for nonconvulsive status on 3/12.  This was felt possibly secondary to neurotoxicity from cefepime.  In spite of treating potential seizure stopping antibiotics and continuing supportive care as of 3/13 he remained obtunded and developed worsening hypoxia and work of breathing therefore pulmonary asked to evaluate  History of present illness   78 year old with multiple medical problems initially admitted on 3/7 with chief complaint of Dysuria and back pain.  Reporting pain at level 10 out of 10 in severity in the emergency room was found to have leukocytosis, urinalysis consistent with urinary tract infection and met SIRS criteria.  CT abdomen pelvis showed bilateral hydronephrosis with bladder wall thickening and multiple bladder diverticuli suggesting chronic bladder outlet obstruction with cystitis and urinary tract infection he was admitted to the internal medicine service with a working diagnosis of sepsis secondary to urinary tract source, secondary to obstructive uropathy.  Cultures were obtained and he was started on cefepime.  Urinary cultures came back greater than 100,000 colonies of Cipro resistant Pseudomonas.  On 3/9 he had been started on Pyridium for persistent dysuria.  Diuretics were held due to creatinine bump, Urology was consulted Foley catheter was placed and recommendations from a urology standpoint where that he would require chronic Foley catheter at this point given failed attempts in the  outpatient setting to manage this.  On 3/12 he began Be progressively encephalopathic, CT of head was obtained which was negative, MRI of lumbar spine was obtained to rule out epidural abscess and infectious disease was consulted.  And and lumbar puncture was also ordered, as well as EEG.  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. New possible seizure-nonconvulsive status it was recommended that cefepime be discontinued given concern for neurotoxicity.  He was subsequently switched from cefepime to Zosyn. On 3/13 noted by nursing staff to have worsening work of breathing with worsening hypoxia and respiratory rate increasing to 20 to 30s.  Rapid response was called.  Chest x-ray was obtained this showed right basilar volume loss and bilateral infiltrates.  Arterial blood gas showed significant metabolic acidosis, he remained lethargic and difficult to arouse and therefore critical care was asked to evaluate  Past Medical History  Chronic urinary retention with prostate hypertrophy, prior Pseudomonas urinary tract infections, stage III chronic kidney disease, moderate to severe pulmonary hypertension, chronic diastolic heart failure, myelodysplasia with myelodysplastic syndrome, chronic back pain  Significant Hospital Events   3/7: Admitted with presumptive diagnosis of urinary tract infection 3/9: worsening renal failure, seen by urology, Foley catheter placed 3/12: Worsening mental status, EEG worrisome for possible nonconvulsive status.  Felt possibly due to neurotoxicity from cefepime this was discontinued and Zosyn was started.  lumbar puncture performed, for possible meningitis.  Infectious disease consulted, neurology consulted, started on Keppra, moved to [facility] for continuous EEG monitoring 3/13 remains unresponsive, developed worsening hypoxia staph from prior LP  unremarkable without evidence of meningitis.  Pulmonary asked to evaluate  Consults:  ID Neurology Urology PCCM  Procedures:   3/12 Fluoro-guided LP performed at L3-L4. Opening pressure 12 cm H20. 13.5 ml clear CSF obtained Significant Diagnostic Tests:  MRI brain 3/12: Truncated and motion degraded examination without visible acute abnormality.  MRI L spine 3/12:1. Mild foraminal narrowing bilaterally at L4-5 is worse on the left.2. Chronic endplate changes at B1-4 with associated Schmorl's node. 3. Minimal rightward disc bulging without significant stenosis.4. 10 mm solid lesion at the upper pole of the right kidney was not well seen on recent CT scans. Small renal cell carcinoma is not excluded. Recommend dedicated MRI of the kidneys as the patient's condition allows. 5. Stable cyst at the lower pole of the right kidney.  US abdomen 3/12: 1. No acute findings. 2. Single visualized gallstone. No evidence of acute cholecystitis. No other abnormality.  Micro Data:  UC 3/7 pseudomonas (cipro resistant) BCx2 3/8>>> BCX2 3/12>>> CSF 3/12: neg resp culture 3/13>>>  Antimicrobials:  Cefepime 3/7>>3/12 Zosyn 3/12>>> zyvox 3/13>>> Interim history/subjective:  No events overnight, remains sedate  Objective   Blood pressure 138/73, pulse 77, temperature 98.4 F (36.9 C), temperature source Oral, resp. rate 16, height _0  (1.905 m), weight 83 kg, SpO2 100 %.    Vent Mode: PRVC FiO2 (%):  [40 %-60 %] 40 % Set Rate:  [16 bmp] 16 bmp Vt Set:  [670 mL] 670 mL PEEP:  [5 cmH20] 5 cmH20 Plateau Pressure:  [15 cmH20-20 cmH20] 16 cmH20   Intake/Output Summary (Last 24 hours) at 07/30/2018 0851 Last data filed at 07/30/2018 0600 Gross per 24 hour  Intake 2382.21 ml  Output 345 ml  Net 2037.21 ml   Filed Weights   07/24/18 0155 07/30/18 0200 07/30/18 0400  Weight: 82.7 kg 83 kg 83 kg   Examination: General: Acutely ill appearing male, NAD HENT: [place]/AT, PERRL, EOM-I and  MMM Lungs: CTA bilaterally Cardiovascular: RRR, Nl S1/S2 and -M/R/G Abdomen: Soft, NT, ND and +BS Extremities: Warm and dry dependent edema Neuro: No significant response even to noxious stimulus or painful stimulus GU: Clear yellow  Resolved Hospital Problem list    Assessment & Plan:   Acute metabolic encephalopathy.  likely multifactorial: sepsis, possible non-convulsive seizure 2/2 neurotoxicity from cefepime, metabolic acidosis, renal failure and worsening azotemia.  Wonder if he is postictal currently Plan Continue EEG monitoring Treat metabolic derangements Supportive care Minimize sedating medications   Acute hypoxic respiratory failure 2/2 evolving pulmonary infiltrates and atx super imposed on underlying severe PH and OSA  chest x-ray personally reviewed: Is elevated right hemidiaphragm, bilateral infiltrates, suspect hypoxia and work of breathing secondary to possible aspiration and atelectasis -He is not protecting his airway Plan Full vent support while on burst suppression PAD protocol RASS goal 0 VAP bundle F/U cultures  Acute on chronic renal failure (st III CKD) 2/2 chronic urinary retention w/ h/o poorly functioning bladder, enlarged prostate and mild to mod obstruction. Has had multiple recurrent UTIs. Has not completed treatment in past d/t financial restraints.  Plan Avoid hypotension Levophed as needed Continuing IV fluids Strict intake output Renal dose medications  Fluid, electrolyte and acid base imbalance: hyperchloremia amd Non-anion gap metabolic acidosis Plan Changing IV fluids to lactated Ringer's Serial chemistries  Complicated Pseudomonas Urinary tract infection in setting of obstructive uropathy Plan will need chronic Foley catheter Currently day #8 of 8 antibiotics, had been changed to Zosyn, d/c  On-going SIRS/sepsis UT infection +/- new aspiration  Plan Trend cbc and fever curve abx per above Checking resp culture  Solid lesion  on upper pole of right kidney.  Plan Will need specific MRI if improves.  H/o myelodysplastic syndrome CT abdomen imaging also noting innumerable lytic lesions involving the spine Plan Follow-up oncology outpatient  Iron def anemia Plan Trend cbc Transfuse per protocol  Best practice:  Diet: NPO Pain/Anxiety/Delirium protocol (if indicated): 3/13 VAP protocol (if indicated): 3/13 DVT prophylaxis: [place] heparin  GI prophylaxis: H2B Glucose control: ssi Mobilitity BR Code Status: full code  Family Communication: pending Disposition: Adequately ill requires transfer to the intensive care for airway protection mechanical ventilation continuous EEG monitoring with possible titration of AEDs.  Also in light of worsening renal function may need central access and hemodynamic support  Labs   CBC: Recent Labs  Lab 07/26/2018 2048  07/27/18 1906 07/28/18 0355 07/28/18 1625 07/29/18 0933 07/29/18 1508 07/30/18 0626  WBC 26.8*   < > 25.1* 27.9* 32.3* 31.9*  --  29.5*  NEUTROABS 11.9*  --   --   --   --  12.2*  --   --   HGB 10.3*   < > 8.8* 8.4* 8.9* 8.4* 10.5* 8.6*  HCT 35.5*   < > 30.0* 29.0* 32.2* 29.8* 31.0* 30.7*  MCV 86.6   < > 86.0 86.1 87.5 84.7  --  85.5  PLT 165   < > 136* 141* 156 175  --  162   < > = values in this interval not displayed.    Basic Metabolic Panel: Recent Labs  Lab 07/24/18 0456 07/25/18 0650  07/27/18 1906 07/28/18 0355 07/28/18 1625 07/29/18 0933 07/29/18 1508 07/30/18 0626  NA 141 137   < > 138 141 144 145 147* 147*  K 3.5 3.6   < > 3.9 4.0 3.8 3.8 3.5 3.5  CL 109 104   < > 107 110 113* 116*  --  115*  CO2 23 22   < > 17* 17* 19* 18*  --  16*  GLUCOSE 111* 120*   < > 99 103* 97 91  --  142*  BUN 64* 64*   < > 86* 89* 87* 85*  --  85*  CREATININE 1.35* 1.71*   < > 2.13* 2.02* 1.98* 2.39*  --  2.64*  CALCIUM 9.5 9.7   < > 9.1 9.2 9.5 9.4  --  9.9  MG 2.4 2.4  --   --   --   --   --   --   --    < > = values in this interval not displayed.    GFR: Estimated Creatinine Clearance: 27.9 mL/min (A) (by C-G formula based on SCr of 2.64 mg/dL (H)). Recent Labs  Lab 07/19/2018 2048  07/28/18 0355 07/28/18 1625 07/29/18 0933 07/29/18 1205 07/29/18 1443 07/30/18 0626  PROCALCITON  --   --   --   --  2.52  --   --  2.02  WBC 26.8*   < > 27.9* 32.3* 31.9*  --   --  29.5*  LATICACIDVEN 0.8  --   --   --   --  0.5 0.7  --    < > = values in this interval not displayed.    Liver Function Tests: Recent Labs  Lab 08/16/2018 2048 07/27/18 1906 07/28/18 0355 07/28/18 1625 07/29/18 0933  AST _0 ALT _1 ALKPHOS 110 84 81 90 88  BILITOT 0.5 1.0 0.8 0.9 0.8  PROT 6.7 6.1* 6.2* 6.2* 5.5*  ALBUMIN 3.4* 2.6* 2.4* 2.5* 2.0*   No results for input(s): LIPASE, AMYLASE in the last 168 hours. Recent Labs  Lab 07/27/18 1209  AMMONIA 18    ABG    Component Value Date/Time   PHART 7.327 (L) 07/29/2018 1508   PCO2ART 32.6 07/29/2018 1508   PO2ART 166.0 (H) 07/29/2018 1508   HCO3 17.0 (L) 07/29/2018 1508   TCO2 18 (L) 07/29/2018 1508   ACIDBASEDEF 8.0 (H) 07/29/2018 1508   O2SAT 99.0 07/29/2018 1508     Coagulation Profile: No results for input(s): INR, PROTIME in the last 168 hours.  Cardiac Enzymes: No results for input(s): CKTOTAL, CKMB, CKMBINDEX, TROPONINI in the last 168 hours.  HbA1C: Hgb A1c MFr Bld  Date/Time Value Ref Range Status  08/18/2017 02:47 AM 5.1 4.8 - 5.6 % Final    Comment:    (NOTE) Pre diabetes:          5.7%-6.4% Diabetes:              >6.4% Glycemic control for   <7.0% adults with diabetes   07/28/2016 12:18 PM 5.8 4.6 - 6.5 % Final    Comment:    Glycemic Control Guidelines for People with Diabetes:Non Diabetic:  <6%Goal of Therapy: <7%Additional Action Suggested:  >8%     CBG: Recent Labs  Lab 07/29/18 1515 07/30/18 0734  GLUCAP 107* 113*   The patient is critically ill with multiple organ systems failure and requires high complexity decision making for  assessment and support, frequent evaluation and titration of therapies, application of advanced monitoring technologies and extensive interpretation of multiple databases.   Critical Care Time devoted to patient care services described in this note is  32  Minutes. This time reflects time of care of this signee Dr Jennet Maduro. This critical care time does not reflect procedure time, or teaching time or supervisory time of PA/NP/Med student/Med Resident etc but could involve care discussion time.  Rush Farmer, M.D. [facility] Pulmonary/Critical Care Medicine. Pager: [phone]. After hours pager: [phone].

## 2018-07-30 NOTE — Progress Notes (Signed)
LTM EEG checked, no skin breakdown noted on frontal leads.

## 2018-07-30 NOTE — Progress Notes (Addendum)
Subjective: Intubated and sedated on Versed at a rate of 5 mg/hr.   Objective: Current vital signs: BP 138/73 (BP Location: Left Arm)   Pulse 77   Temp 98.4 F (36.9 C) (Oral)   Resp 16   Ht _0  (1.905 m)   Wt 83 kg   SpO2 100%   BMI 22.87 kg/m  Vital signs in last 24 hours: Temp:  [97.8 F (36.6 C)-99.2 F (37.3 C)] 98.4 F (36.9 C) (03/14 0200) Pulse Rate:  [66-105] 77 (03/14 0800) Resp:  [15-19] 16 (03/14 0800) BP: (94-139)/(61-80) 138/73 (03/14 0800) SpO2:  [100 %] 100 % (03/14 0800) FiO2 (%):  [40 %-60 %] 40 % (03/14 0800) Weight:  [83 kg] 83 kg (03/14 0400)  Intake/Output from previous day: 03/13 0701 - 03/14 0700 In: 2382.2 [I.V.:723; IV Piggyback:1659.3] Out: 345 [Urine:345] Intake/Output this shift: No intake/output data recorded. Nutritional status:  Diet Order            Diet NPO time specified  Diet effective now             HEENT: [place]/AT Lungs: Intubated Ext: Warm and well perfused  Neurologic Exam: Ment: Intubated and sedated. No eye opening, purposeful movement or attempts to communicate. Will squint eyes slightly in response to plantar stimulation.  CN: Hippus noted on assessment of pupillary light reflex; pupils are symmetric and 2-3 mm. No blink to threat. Corneal reflexes intact. Weak doll's eye reflex; eyes conjugate without nystagmus. Face flaccidly symmetric. No response to loud clap or voice.  Motor: Increased extensor tone LUE. No movement to pinch. Flaccid tone RUE Increased tone LLE. No movement to plantar stimulation.  Flaccid tone RLE. Weak flexion and ankle and knee with plantar stimulation.  Sensory: As above Reflexes: 2+ bilaeral brachioradialis Toes: Equivocal on right, mute on left  Lab Results: Results for orders placed or performed during the hospital encounter of 08/12/2018 (from the past 48 hour(s))  Glucose, CSF     Status: None   Collection Time: 07/28/18  3:10 PM  Result Value Ref Range   Glucose, CSF 68 40 - 70  mg/dL    Comment: Performed at [facility], [place] [address] [place], [place] [address]  Protein, CSF     Status: None   Collection Time: 07/28/18  3:10 PM  Result Value Ref Range   Total  Protein, CSF 19 15 - 45 mg/dL    Comment: Performed at [facility], [place] [address] [place], [place] [address]  CSF cell count with differential     Status: Abnormal   Collection Time: 07/28/18  3:10 PM  Result Value Ref Range   Tube # 4    Color, CSF COLORLESS COLORLESS   Appearance, CSF CLEAR (A) CLEAR   Supernatant NOT INDICATED    RBC Count, CSF 2 (H) 0 /cu mm   WBC, CSF 2 0 - 5 /cu mm   Other Cells, CSF TOO FEW TO COUNT, SMEAR AVAILABLE FOR REVIEW     Comment: Performed at [facility], [place] [address], [place], [place] [address]  CSF culture     Status: None (Preliminary result)   Collection Time: 07/28/18  3:10 PM  Result Value Ref Range   Specimen Description      CSF Performed at [place] [address], [place], [place] [address]    Special Requests      NONE Performed at [facility], [place] [address]  [place], [place] [address]    Gram Stain      NO WBC SEEN CYTOSPIN SMEAR NO ORGANISMS SEEN Gram Stain Report Called to,Read Back By and Verified With: PENNINGTON,M. RN _0  ON 03.12.2020 BY COHEN,K GRAM STAIN REVIEWED-AGREE WITH RESULT T. TYSOR    Culture      NO GROWTH < 24 HOURS Performed at [facility], [place] [address] [place], [place] [address]    Report Status PENDING   Culture, Urine     Status: Abnormal   Collection Time: 07/28/18  3:28 PM  Result Value Ref Range   Specimen Description      URINE, RANDOM Performed at [facility], [place] [address] [place], [place] [address]    Special Requests      NONE Performed at [facility], [place] [address] [place], [place] [address]    Culture >=100,000 COLONIES/mL PSEUDOMONAS  AERUGINOSA (A)    Report Status 07/30/2018 FINAL    Organism ID, Bacteria PSEUDOMONAS AERUGINOSA (A)       Susceptibility   Pseudomonas aeruginosa - MIC*    CEFTAZIDIME <=1 SENSITIVE Sensitive     CIPROFLOXACIN >=4 RESISTANT Resistant     GENTAMICIN <=1 SENSITIVE Sensitive     IMIPENEM 2 SENSITIVE Sensitive     PIP/TAZO <=4 SENSITIVE Sensitive     CEFEPIME 4 SENSITIVE Sensitive     * >=100,000 COLONIES/mL PSEUDOMONAS AERUGINOSA  Blood gas, arterial     Status: Abnormal   Collection Time: 07/28/18  3:45 PM  Result Value Ref Range   FIO2 100.00    Delivery systems OXYGEN MASK    pH, Arterial 7.239 (L) 7.350 - 7.450   pCO2 arterial 42.0 32.0 - 48.0 mmHg   pO2, Arterial 190 (H) 83.0 - 108.0 mmHg   Bicarbonate 17.3 (L) 20.0 - 28.0 mmol/L   Acid-base deficit 9.1 (H) 0.0 - 2.0 mmol/L   O2 Saturation 98.7 %   Patient temperature 98.6    Collection site BRACHIAL ARTERY    Drawn by [id number]    Sample type ARTERIAL     Comment: Performed at [facility], [place] [address] [place], [place] [address]  MRSA PCR Screening     Status: None   Collection Time: 07/28/18  4:04 PM  Result Value Ref Range   MRSA by PCR NEGATIVE NEGATIVE    Comment:        The GeneXpert MRSA Assay (FDA approved for NASAL specimens only), is one component of a comprehensive MRSA colonization surveillance program. It is not intended to diagnose MRSA infection nor to guide or monitor treatment for MRSA infections. Performed at [facility], [place] [address] [place], [place] [address]   CBC     Status: Abnormal   Collection Time: 07/28/18  4:25 PM  Result Value Ref Range   WBC 32.3 (H) 4.0 - 10.5 K/uL   RBC 3.68 (L) 4.22 - 5.81 MIL/uL   Hemoglobin 8.9 (L) 13.0 - 17.0 g/dL   HCT 32.2 (L) 39.0 - 52.0 %   MCV 87.5 80.0 - 100.0 fL   MCH 24.2 (L) 26.0 - 34.0 pg   MCHC 27.6 (L) 30.0 - 36.0 g/dL   RDW 17.5 (H) 11.5 - 15.5 %   Platelets 156 150 - 400 K/uL   nRBC 0.7 (H) 0.0 - 0.2  %    Comment: Performed at [facility], [place] [address], [place], [place] [address]  Comprehensive metabolic panel  Status: Abnormal   Collection Time: 07/28/18  4:25 PM  Result Value Ref Range   Sodium 144 135 - 145 mmol/L   Potassium 3.8 3.5 - 5.1 mmol/L   Chloride 113 (H) 98 - 111 mmol/L   CO2 19 (L) 22 - 32 mmol/L   Glucose, Bld 97 70 - 99 mg/dL   BUN 87 (H) 8 - 23 mg/dL   Creatinine, Ser 1.98 (H) 0.61 - 1.24 mg/dL   Calcium 9.5 8.9 - 10.3 mg/dL   Total Protein 6.2 (L) 6.5 - 8.1 g/dL   Albumin 2.5 (L) 3.5 - 5.0 g/dL   AST 26 15 - 41 U/L   ALT 13 0 - 44 U/L   Alkaline Phosphatase 90 38 - 126 U/L   Total Bilirubin 0.9 0.3 - 1.2 mg/dL   GFR calc non Af Amer 32 (L) >60 mL/min   GFR calc Af Amer 37 (L) >60 mL/min   Anion gap 12 5 - 15    Comment: Performed at [facility], [place] [address], [place], [place] [address]  CBC with Differential/Platelet     Status: Abnormal   Collection Time: 07/29/18  9:33 AM  Result Value Ref Range   WBC 31.9 (H) 4.0 - 10.5 K/uL   RBC 3.52 (L) 4.22 - 5.81 MIL/uL   Hemoglobin 8.4 (L) 13.0 - 17.0 g/dL    Comment: REPEATED TO VERIFY   HCT 29.8 (L) 39.0 - 52.0 %   MCV 84.7 80.0 - 100.0 fL   MCH 23.9 (L) 26.0 - 34.0 pg   MCHC 28.2 (L) 30.0 - 36.0 g/dL   RDW 17.9 (H) 11.5 - 15.5 %   Platelets 175 150 - 400 K/uL   nRBC 0.9 (H) 0.0 - 0.2 %   Neutrophils Relative % 39 %   Neutro Abs 12.2 (H) 1.7 - 7.7 K/uL   Lymphocytes Relative 6 %   Lymphs Abs 2.1 0.7 - 4.0 K/uL   Monocytes Relative 21 %   Monocytes Absolute 6.8 (H) 0.1 - 1.0 K/uL   Eosinophils Relative 2 %   Eosinophils Absolute 0.7 (H) 0.0 - 0.5 K/uL   Basophils Relative 3 %   Basophils Absolute 0.9 (H) 0.0 - 0.1 K/uL   WBC Morphology      MARKED LEFT SHIFT (>5% METAS,MYELOS AND PROS, OCC BLAST NOTED)   Immature Granulocytes 29 %   Abs Immature Granulocytes 9.25 (H) 0.00 - 0.07 K/uL   Schistocytes PRESENT    Tear Drop Cells PRESENT     Comment:  Performed at [facility], [place] [address], [place], [place] [address]  Comprehensive metabolic panel     Status: Abnormal   Collection Time: 07/29/18  9:33 AM  Result Value Ref Range   Sodium 145 135 - 145 mmol/L   Potassium 3.8 3.5 - 5.1 mmol/L   Chloride 116 (H) 98 - 111 mmol/L   CO2 18 (L) 22 - 32 mmol/L   Glucose, Bld 91 70 - 99 mg/dL   BUN 85 (H) 8 - 23 mg/dL   Creatinine, Ser 2.39 (H) 0.61 - 1.24 mg/dL   Calcium 9.4 8.9 - 10.3 mg/dL   Total Protein 5.5 (L) 6.5 - 8.1 g/dL   Albumin 2.0 (L) 3.5 - 5.0 g/dL   AST 24 15 - 41 U/L   ALT 13 0 - 44 U/L   Alkaline Phosphatase 88 38 - 126 U/L   Total Bilirubin 0.8 0.3 - 1.2 mg/dL   GFR calc non Af  Amer 25 (L) >60 mL/min   GFR calc Af Amer 29 (L) >60 mL/min   Anion gap 11 5 - 15    Comment: Performed at [place] [address], [place], [place] [address]  Procalcitonin - Baseline     Status: None   Collection Time: 07/29/18  9:33 AM  Result Value Ref Range   Procalcitonin 2.52 ng/mL    Comment:        Interpretation: PCT > 2 ng/mL: Systemic infection (sepsis) is likely, unless other causes are known. (NOTE)       Sepsis PCT Algorithm           Lower Respiratory Tract                                      Infection PCT Algorithm    ----------------------------     ----------------------------         PCT < 0.25 ng/mL                PCT < 0.10 ng/mL         Strongly encourage             Strongly discourage   discontinuation of antibiotics    initiation of antibiotics    ----------------------------     -----------------------------       PCT 0.25 - 0.50 ng/mL            PCT 0.10 - 0.25 ng/mL               OR       >80% decrease in PCT            Discourage initiation of                                            antibiotics      Encourage discontinuation           of antibiotics    ----------------------------     -----------------------------         PCT >= 0.50 ng/mL              PCT 0.26 - 0.50 ng/mL                AND       <80% decrease in PCT              Encourage initiation of                                             antibiotics       Encourage continuation           of antibiotics    ----------------------------     -----------------------------        PCT >= 0.50 ng/mL                  PCT > 0.50 ng/mL               AND         increase in PCT  Strongly encourage                                      initiation of antibiotics    Strongly encourage escalation           of antibiotics                                     -----------------------------                                           PCT <= 0.25 ng/mL                                                 OR                                        > 80% decrease in PCT                                     Discontinue / Do not initiate                                             antibiotics Performed at [facility], [place] [address] [place], [place] [address]   Blood gas, arterial     Status: Abnormal   Collection Time: 07/29/18  9:58 AM  Result Value Ref Range   FIO2 100.00    Delivery systems NON-REBREATHER OXYGEN MASK    pH, Arterial 7.284 (L) 7.350 - 7.450   pCO2 arterial 39.0 32.0 - 48.0 mmHg   pO2, Arterial 205 (H) 83.0 - 108.0 mmHg   Bicarbonate 17.8 (L) 20.0 - 28.0 mmol/L   Acid-base deficit 7.5 (H) 0.0 - 2.0 mmol/L   O2 Saturation 99.0 %   Patient temperature 99.0    Collection site RIGHT RADIAL    Drawn by [id number]    Sample type ARTERIAL    Allens test (pass/fail) PASS PASS  Lactic acid, plasma     Status: None   Collection Time: 07/29/18 12:05 PM  Result Value Ref Range   Lactic Acid, Venous 0.5 0.5 - 1.9 mmol/L    Comment: Performed at [place] [address] [place], [place] [address]  Lactic acid, plasma     Status: None   Collection Time: 07/29/18  2:43 PM  Result Value Ref Range   Lactic Acid, Venous 0.7 0.5 - 1.9 mmol/L    Comment: Performed at [place] [address], [place], [place] [address]  I-STAT 7, (LYTES, BLD GAS, ICA, H+H)     Status: Abnormal   Collection Time: 07/29/18  3:08 PM  Result Value Ref Range   pH, Arterial 7.327 (L) 7.350 - 7.450   pCO2 arterial 32.6 32.0 -  48.0 mmHg   pO2, Arterial 166.0 (H) 83.0 - 108.0 mmHg   Bicarbonate 17.0 (L) 20.0 - 28.0 mmol/L   TCO2 18 (L) 22 - 32 mmol/L   O2 Saturation 99.0 %   Acid-base deficit 8.0 (H) 0.0 - 2.0 mmol/L   Sodium 147 (H) 135 - 145 mmol/L   Potassium 3.5 3.5 - 5.1 mmol/L   Calcium, Ion 1.37 1.15 - 1.40 mmol/L   HCT 31.0 (L) 39.0 - 52.0 %   Hemoglobin 10.5 (L) 13.0 - 17.0 g/dL   Patient temperature 99.2 F    Collection site RADIAL, ALLEN'S TEST ACCEPTABLE    Drawn by Operator    Sample type ARTERIAL   Glucose, capillary     Status: Abnormal   Collection Time: 07/29/18  3:15 PM  Result Value Ref Range   Glucose-Capillary 107 (H) 70 - 99 mg/dL  Culture, respiratory (non-expectorated)     Status: None (Preliminary result)   Collection Time: 07/29/18  3:20 PM  Result Value Ref Range   Specimen Description TRACHEAL ASPIRATE    Special Requests NONE    Gram Stain      MODERATE WBC PRESENT,BOTH PMN AND MONONUCLEAR NO ORGANISMS SEEN Performed at [facility], [address], [place], [place] [address]    Culture PENDING    Report Status PENDING   Basic metabolic panel     Status: Abnormal   Collection Time: 07/30/18  6:26 AM  Result Value Ref Range   Sodium 147 (H) 135 - 145 mmol/L   Potassium 3.5 3.5 - 5.1 mmol/L   Chloride 115 (H) 98 - 111 mmol/L   CO2 16 (L) 22 - 32 mmol/L   Glucose, Bld 142 (H) 70 - 99 mg/dL   BUN 85 (H) 8 - 23 mg/dL   Creatinine, Ser 2.64 (H) 0.61 - 1.24 mg/dL   Calcium 9.9 8.9 - 10.3 mg/dL   GFR calc non Af Amer 23 (L) >60 mL/min   GFR calc Af Amer 26 (L) >60 mL/min   Anion gap 16 (H) 5 - 15    Comment: Performed at [facility], [place] [address] [place], [place] [address]  CBC     Status: Abnormal   Collection Time: 07/30/18  6:26  AM  Result Value Ref Range   WBC 29.5 (H) 4.0 - 10.5 K/uL   RBC 3.59 (L) 4.22 - 5.81 MIL/uL   Hemoglobin 8.6 (L) 13.0 - 17.0 g/dL   HCT 30.7 (L) 39.0 - 52.0 %   MCV 85.5 80.0 - 100.0 fL   MCH 24.0 (L) 26.0 - 34.0 pg   MCHC 28.0 (L) 30.0 - 36.0 g/dL   RDW 18.1 (H) 11.5 - 15.5 %   Platelets 162 150 - 400 K/uL   nRBC 0.7 (H) 0.0 - 0.2 %    Comment: Performed at [facility], [place] [address] [place], [place] [address]  Brain natriuretic peptide     Status: None   Collection Time: 07/30/18  6:26 AM  Result Value Ref Range   B Natriuretic Peptide 44.9 0.0 - 100.0 pg/mL    Comment: Performed at [place] [address], [place],  [address]  Procalcitonin     Status: None   Collection Time: 07/30/18  6:26 AM  Result Value Ref Range   Procalcitonin 2.02 ng/mL    Comment:        Interpretation: PCT > 2 ng/mL: Systemic infection (sepsis) is likely, unless other causes are  known. (NOTE)       Sepsis PCT Algorithm           Lower Respiratory Tract                                      Infection PCT Algorithm    ----------------------------     ----------------------------         PCT < 0.25 ng/mL                PCT < 0.10 ng/mL         Strongly encourage             Strongly discourage   discontinuation of antibiotics    initiation of antibiotics    ----------------------------     -----------------------------       PCT 0.25 - 0.50 ng/mL            PCT 0.10 - 0.25 ng/mL               OR       >80% decrease in PCT            Discourage initiation of                                            antibiotics      Encourage discontinuation           of antibiotics    ----------------------------     -----------------------------         PCT >= 0.50 ng/mL              PCT 0.26 - 0.50 ng/mL               AND       <80% decrease in PCT              Encourage initiation of                                             antibiotics       Encourage continuation           of  antibiotics    ----------------------------     -----------------------------        PCT >= 0.50 ng/mL                  PCT > 0.50 ng/mL               AND         increase in PCT                  Strongly encourage                                      initiation of antibiotics    Strongly encourage escalation           of antibiotics                                     -----------------------------  PCT <= 0.25 ng/mL                                                 OR                                        > 80% decrease in PCT                                     Discontinue / Do not initiate                                             antibiotics Performed at [facility], [place] [address] [place], [place] [address]   Glucose, capillary     Status: Abnormal   Collection Time: 07/30/18  7:34 AM  Result Value Ref Range   Glucose-Capillary 113 (H) 70 - 99 mg/dL    Recent Results (from the past 240 hour(s))  Urine culture     Status: Abnormal   Collection Time: 07/19/2018  8:17 PM  Result Value Ref Range Status   Specimen Description   Final    URINE, RANDOM Performed at [place] [address] [place], [place] [address]    Special Requests   Final    NONE Performed at [facility], [place] [address], [place], [place] [address]    Culture >=100,000 COLONIES/mL PSEUDOMONAS AERUGINOSA (A)  Final   Report Status 07/26/2018 FINAL  Final   Organism ID, Bacteria PSEUDOMONAS AERUGINOSA (A)  Final      Susceptibility   Pseudomonas aeruginosa - MIC*    CEFTAZIDIME <=1 SENSITIVE Sensitive     CIPROFLOXACIN >=4 RESISTANT Resistant     GENTAMICIN <=1 SENSITIVE Sensitive     IMIPENEM 2 SENSITIVE Sensitive     PIP/TAZO <=4 SENSITIVE Sensitive     CEFEPIME 4 SENSITIVE Sensitive     * >=100,000 COLONIES/mL PSEUDOMONAS AERUGINOSA  Culture, blood (Routine X 2) w Reflex to ID Panel     Status: None    Collection Time: 07/24/18 12:59 AM  Result Value Ref Range Status   Specimen Description   Final    BLOOD RIGHT ARM Performed at [facility], [place] [address], [place], [place] [address]    Special Requests   Final    BOTTLES DRAWN AEROBIC AND ANAEROBIC Blood Culture adequate volume Performed at [place] [address] [name], [place] [address]    Culture   Final    NO GROWTH 5 DAYS Performed at [facility], [place] [address] [place], [place] [address]    Report Status 07/29/2018 FINAL  Final  Culture, blood (Routine X 2) w Reflex to ID Panel     Status: None   Collection Time: 07/24/18 12:59 AM  Result Value Ref Range Status   Specimen Description   Final    BLOOD LEFT HAND Performed at [place] [address], [place],  [address]    Special Requests   Final  BOTTLES DRAWN AEROBIC AND ANAEROBIC Blood Culture adequate volume Performed at [place] [address] [place], [place] [address]    Culture   Final    NO GROWTH 5 DAYS Performed at [facility], [place] [address], [place], [place] [address]    Report Status 07/29/2018 FINAL  Final  MRSA PCR Screening     Status: None   Collection Time: 07/24/18  4:49 AM  Result Value Ref Range Status   MRSA by PCR NEGATIVE NEGATIVE Final    Comment:        The GeneXpert MRSA Assay (FDA approved for NASAL specimens only), is one component of a comprehensive MRSA colonization surveillance program. It is not intended to diagnose MRSA infection nor to guide or monitor treatment for MRSA infections. Performed at [facility], [place] [address] [place], [place] [address]   Culture, blood (routine x 2)     Status: None (Preliminary result)   Collection Time: 07/28/18  8:54 AM  Result Value Ref Range Status   Specimen Description   Final    BLOOD LEFT HAND Performed at [place] [address] [place], [place] [address]    Special Requests   Final    BOTTLES DRAWN AEROBIC AND ANAEROBIC Blood Culture adequate volume Performed at [place] [address], [place], [place] [address]    Culture   Final    NO GROWTH 1 DAY Performed at [facility], [place] [address] [place], [place] [address]    Report Status PENDING  Incomplete  Culture, blood (routine x 2)     Status: None (Preliminary result)   Collection Time: 07/28/18  8:54 AM  Result Value Ref Range Status   Specimen Description   Final    BLOOD LEFT HAND Performed at [place] [address], [place], [place] [address]    Special Requests   Final    BOTTLES DRAWN AEROBIC AND ANAEROBIC Blood Culture adequate volume Performed at [place] [address], [place], [place] [address]    Culture   Final    NO GROWTH 1 DAY Performed at [facility], [place] [address] [place], [place] [address]    Report Status PENDING  Incomplete  CSF culture     Status: None (Preliminary result)   Collection Time: 07/28/18  3:10 PM  Result Value Ref Range Status   Specimen Description   Final    CSF Performed at [place] [address], [place], [place] [address]    Special Requests   Final    NONE Performed at [facility], [place] [address] [place], [place] [address]    Gram Stain   Final    NO WBC SEEN CYTOSPIN SMEAR NO ORGANISMS SEEN Gram Stain Report Called to,Read Back By and Verified With: PENNINGTON,M. RN _0  ON 03.12.2020 BY COHEN,K GRAM STAIN REVIEWED-AGREE WITH RESULT T. TYSOR    Culture   Final    NO GROWTH < 24 HOURS Performed at [facility], [place] [address] [place],  [address]    Report Status PENDING  Incomplete  Culture, Urine     Status: Abnormal   Collection Time: 07/28/18  3:28 PM  Result Value Ref Range Status   Specimen Description   Final    URINE, RANDOM Performed at  [place] [address] [place],  [address]  Special Requests   Final    NONE Performed at [facility], [place] [address] [place], [place] [address]    Culture >=100,000 COLONIES/mL PSEUDOMONAS AERUGINOSA (A)  Final   Report Status 07/30/2018 FINAL  Final   Organism ID, Bacteria PSEUDOMONAS AERUGINOSA (A)  Final      Susceptibility   Pseudomonas aeruginosa - MIC*    CEFTAZIDIME <=1 SENSITIVE Sensitive     CIPROFLOXACIN >=4 RESISTANT Resistant     GENTAMICIN <=1 SENSITIVE Sensitive     IMIPENEM 2 SENSITIVE Sensitive     PIP/TAZO <=4 SENSITIVE Sensitive     CEFEPIME 4 SENSITIVE Sensitive     * >=100,000 COLONIES/mL PSEUDOMONAS AERUGINOSA  MRSA PCR Screening     Status: None   Collection Time: 07/28/18  4:04 PM  Result Value Ref Range Status   MRSA by PCR NEGATIVE NEGATIVE Final    Comment:        The GeneXpert MRSA Assay (FDA approved for NASAL specimens only), is one component of a comprehensive MRSA colonization surveillance program. It is not intended to diagnose MRSA infection nor to guide or monitor treatment for MRSA infections. Performed at [facility], [place] [address] [place], [place] [address]   Culture, respiratory (non-expectorated)     Status: None (Preliminary result)   Collection Time: 07/29/18  3:20 PM  Result Value Ref Range Status   Specimen Description TRACHEAL ASPIRATE  Final   Special Requests NONE  Final   Gram Stain   Final    MODERATE WBC PRESENT,BOTH PMN AND MONONUCLEAR NO ORGANISMS SEEN Performed at [facility], [address], [place], [place] [address]    Culture PENDING  Incomplete   Report Status PENDING  Incomplete    Lipid Panel No results for input(s): CHOL, TRIG, HDL, CHOLHDL, VLDL, LDLCALC in the last 72 hours.  Studies/Results: Dg Chest 1 View  Result Date: 07/29/2018 CLINICAL DATA:  Hypoxia EXAM: CHEST  1 VIEW COMPARISON:  07/28/2018 FINDINGS: Normal  heart size. Chronic asymmetric elevation of the right hemidiaphragm. Bilateral interstitial and airspace opacities are identified. No significant change in aeration a lungs compared with previous exam. 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 IMPRESSION: No change in aeration a lungs compared with previous exam. Electronically Signed   By: Kerby Moors M.D.   On: 07/29/2018 09:39   Dg Chest 1 View  Result Date: 07/28/2018 CLINICAL DATA:  Leukocytosis. Fever. EXAM: CHEST  1 VIEW COMPARISON:  07/19/2018 FINDINGS: Cardiomediastinal silhouette is enlarged. Mediastinal contours appear intact. There is no evidence of pneumothorax. Chronic elevation of the right hemidiaphragm. Increased interstitial markings with additional peribronchovascular airspace consolidation in the lung bases. Diffusely mottled appearance of the osseous structures. Soft tissues are grossly normal. IMPRESSION: 1. Enlarged cardiac silhouette. 2. Increased interstitial markings with additional peribronchovascular airspace consolidation in the lung bases may represent mixed pattern pulmonary edema or developing multifocal pneumonia. 3. Diffusely mottled appearance of the osseous structures, likely due to diffuse osseous metastatic disease or myeloproliferative disorder. Electronically Signed   By: Fidela Salisbury M.D.   On: 07/28/2018 11:21   Mr Brain Wo Contrast  Result Date: 07/28/2018 CLINICAL DATA:  Encephalopathy EXAM: MRI HEAD WITHOUT CONTRAST TECHNIQUE: Multiplanar, multiecho pulse sequences of the brain and surrounding structures were obtained without intravenous contrast. COMPARISON:  Head CT 07/28/2018 FINDINGS: The examination had to be discontinued prior to completion due to patient altered mental status and inability to cooperate with  the technologist's instructions. Axial DWI, SWI, T2 and FLAIR sequences were obtained. Additionally, the examination  is degraded by motion. There is no acute ischemia. There is mild periventricular white matter hyperintensity compatible with chronic small vessel disease. No age advanced atrophy. No intracranial hemorrhage is visible. IMPRESSION: Truncated and motion degraded examination without visible acute abnormality. Electronically Signed   By: Ulyses Jarred M.D.   On: 07/28/2018 22:34   Mr Lumbar Spine Wo Contrast  Result Date: 07/28/2018 CLINICAL DATA:  Back pain.  Cauda equina syndrome suspected. EXAM: MRI LUMBAR SPINE WITHOUT CONTRAST TECHNIQUE: Multiplanar, multisequence MR imaging of the lumbar spine was performed. No intravenous contrast was administered. COMPARISON:  None. FINDINGS: Segmentation: 5 non rib-bearing lumbar type vertebral bodies are present. The lowest fully formed vertebral body is L5. Alignment: Slight retrolisthesis is present at L4-5. AP alignment is otherwise anatomic. There is some straightening of the normal lumbar lordosis. Vertebrae: Chronic endplate marrow changes are present at L4-5 with a Schmorl's node. Marrow signal and vertebral body heights are otherwise normal. Conus medullaris and cauda equina: Conus extends to the L2-3 level. Conus and cauda equina appear normal. Paraspinal and other soft tissues: Limited imaging the abdomen is unremarkable. There is no significant adenopathy. 2.6 cm cyst is present at the lower pole of the right kidney. A solid lesion at the upper pole of the right kidney measures 10 mm, not well seen on the most recent CT scan. Previously noted hydronephrosis is mostly resolved. There is no significant adenopathy. Disc levels: L1-2: Negative. L2-3: Negative. L3-4: Negative. L4-5: There is uncovering of a broad-based disc protrusion. The central canal is patent. Facet hypertrophy contributes to mild foraminal narrowing bilaterally, worse on the left. L5-S1: Minimal rightward disc bulging is present without significant stenosis. IMPRESSION: 1. Mild foraminal  narrowing bilaterally at L4-5 is worse on the left. 2. Chronic endplate changes at Q0-0 with associated Schmorl's node. 3. Minimal rightward disc bulging without significant stenosis. 4. 10 mm solid lesion at the upper pole of the right kidney was not well seen on recent CT scans. Small renal cell carcinoma is not excluded. Recommend dedicated MRI of the kidneys as the patient's condition allows. 5. Stable cyst at the lower pole of the right kidney. Electronically Signed   By: San Morelle M.D.   On: 07/28/2018 15:27   Portable Chest X-ray  Result Date: 07/29/2018 CLINICAL DATA:  Intubation. EXAM: PORTABLE CHEST 1 VIEW COMPARISON:  07/29/2018. FINDINGS: Endotracheal tube tip noted with tip 4 cm above the carina. NG tube noted with tip projected over the distal stomach. Mild gastric distention noted. Bilateral interstitial prominence and bibasilar atelectasis again noted. No pleural effusion or pneumothorax. Stable cardiomegaly. No acute bony abnormality. Thoracic spine scoliosis. IMPRESSION: 1. Endotracheal tube noted with its tip 4 cm above the carina. NG tube noted with tip projected over the distal stomach. Mild gastric distention noted. 2. Bilateral interstitial prominence and bibasilar atelectasis again noted. 3.  Stable cardiomegaly. Electronically Signed   By: Marcello Moores  Register   On: 07/29/2018 15:06   US Abdomen Limited Ruq  Result Date: 07/28/2018 CLINICAL DATA:  Fever. Possible sludge seen in the gallbladder on CT. EXAM: ULTRASOUND ABDOMEN LIMITED RIGHT UPPER QUADRANT COMPARISON:  CT, 08/10/2018 FINDINGS: Gallbladder: 7 mm stone in the dependent gallbladder. No wall thickening. No pericholecystic fluid. Common bile duct: Diameter: 3 mm Liver: No focal lesion identified. Within normal limits in parenchymal echogenicity. Portal vein is patent on color Doppler imaging with normal direction of blood flow  towards the liver. IMPRESSION: 1. No acute findings. 2. Single visualized gallstone. No  evidence of acute cholecystitis. No other abnormality. Electronically Signed   By: Lajean Manes M.D.   On: 07/28/2018 13:50   Dg Fluoro Guide Lumbar Puncture  Result Date: 07/28/2018 Etheleen Mayhew, MD     07/28/2018  3:17 PM Fluoro-guided LP performed at L3-L4.  Opening pressure 12 cm H20.  13.5 ml clear CSF obtained and sent to lab for testing.  Please see dictation in PACS for further details.    Medications:  Scheduled: . chlorhexidine  15 mL Mouth Rinse BID  . chlorhexidine gluconate (MEDLINE KIT)  15 mL Mouth Rinse BID  . clotrimazole  1 application Topical BID  . famotidine (PEPCID) IV  20 mg Intravenous Q12H  . heparin  5,000 Units Subcutaneous Q8H  . hydrocortisone cream  1 application Topical BID  . mouth rinse  15 mL Mouth Rinse BID  . mouth rinse  15 mL Mouth Rinse 10 times per day   Continuous: . dextrose 5 % and 0.9% NaCl 50 mL/hr at 07/29/18 1611  . levETIRAcetam 500 mg (07/30/18 0945)  . linezolid (ZYVOX) IV 600 mg (07/30/18 0400)  . midazolam 5 mg/hr (07/30/18 0814)  . piperacillin-tazobactam (ZOSYN)  IV 3.375 g (07/30/18 1016)   LTM EEG report, DAY #1: from 0910 07/29/18 to 0730 07/30/18: This was a markedly abnormal continuous video EEG due to rapid GPDs and diffuse background slowing. 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 The GPDs resolved with increased sedation.   Assessment: 78 year old male with encephalopathy in the setting ofpseudomonas complicated UTI as well ashigh-dose cefepime.  1. 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 This has resolved with low-dose Versed gtt (5 mg/hr). 2. Cefepime was held and patient transferred to [facility] on 07/28/2018. Cefepime-induced neurotoxicity is relatively high on the DDx for etiology  of his encephalopathy and seizure activity 3. MRI brain showed no acute abnormality. Images were personally reviewed by Dr. Cheral Marker and nothing was seen on MRI that could explain the patient's symptoms.  4. 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  5. LP results were not consistent with a meningitis or an encephalitis.   Recommendations: -- Continue Keppra 500 twice daily. Would not increase further due to renal disease.  -- Load with valproic acid at 20 mg/kg x 1, then continue at scheduled dose of 5 mg/kg IV TID. LFTs and ammonia level are normal.  -- Correct metabolic derangements -- Will discontinue Versed after valproic acid load and monitor with LTM to assess for possible recurrence of myoclonus and/or electrographic seizure activity on EEG -- Neurology will continue to follow.  40 minutes spent in the neurological evaluation and management of this critically ill patient. Time spent included coordination of care.  Addendum, 2:00 PM: Valproic acid load completed. Discontinuing Versed. Continue LTM EEG. Will continue to monitor.     LOS: 6 days   _0  signed: Dr. Kerney Elbe 07/30/2018  10:20 AM

## 2018-07-30 NOTE — Progress Notes (Signed)
PHARMACY NOTE - RENAL DOSE ADJUSTMENT   Request received for Pharmacy to assist with antibiotic renal dose adjustment.  Patient has been initiated on famotidine 20 mg q12hr. SCr 2.64, estimated CrCl 27 ml/min Current dosage needs to be decrease to 20 mg q24hr based on renal function.  Alanda Slim, PharmD, [facility] Clinical Pharmacist Please see AMION for all Pharmacists' Contact Phone Numbers 07/30/2018, 2:29 PM

## 2018-07-31 ENCOUNTER — Other Ambulatory Visit: Payer: Self-pay

## 2018-07-31 LAB — PROCALCITONIN: Procalcitonin: 1.57 ng/mL

## 2018-07-31 LAB — BASIC METABOLIC PANEL
Anion gap: 13 (ref 5–15)
BUN: 80 mg/dL — ABNORMAL HIGH (ref 8–23)
CO2: 17 mmol/L — ABNORMAL LOW (ref 22–32)
Calcium: 9.2 mg/dL (ref 8.9–10.3)
Chloride: 118 mmol/L — ABNORMAL HIGH (ref 98–111)
Creatinine, Ser: 2.55 mg/dL — ABNORMAL HIGH (ref 0.61–1.24)
GFR calc Af Amer: 27 mL/min — ABNORMAL LOW (ref >60)
GFR calc non Af Amer: 23 mL/min — ABNORMAL LOW (ref >60)
Glucose, Bld: 123 mg/dL — ABNORMAL HIGH (ref 70–99)
Potassium: 3.6 mmol/L (ref 3.5–5.1)
Sodium: 148 mmol/L — ABNORMAL HIGH (ref 135–145)

## 2018-07-31 LAB — MAGNESIUM: Magnesium: 2.5 mg/dL — ABNORMAL HIGH (ref 1.7–2.4)

## 2018-07-31 LAB — CULTURE, RESPIRATORY W GRAM STAIN: Culture: NORMAL

## 2018-07-31 LAB — CBC
HCT: 29.1 % — ABNORMAL LOW (ref 39.0–52.0)
Hemoglobin: 8.8 g/dL — ABNORMAL LOW (ref 13.0–17.0)
MCH: 25.2 pg — AB (ref 26.0–34.0)
MCHC: 30.2 g/dL (ref 30.0–36.0)
MCV: 83.4 fL (ref 80.0–100.0)
Platelets: 171 10*3/uL (ref 150–400)
RBC: 3.49 MIL/uL — ABNORMAL LOW (ref 4.22–5.81)
RDW: 18.2 % — ABNORMAL HIGH (ref 11.5–15.5)
WBC: 35.7 10*3/uL — ABNORMAL HIGH (ref 4.0–10.5)
nRBC: 0.8 % — ABNORMAL HIGH (ref 0.0–0.2)

## 2018-07-31 LAB — GLUCOSE, CAPILLARY: Glucose-Capillary: 100 mg/dL — ABNORMAL HIGH (ref 70–99)

## 2018-07-31 LAB — TRIGLYCERIDES: TRIGLYCERIDES: 132 mg/dL (ref <150)

## 2018-07-31 LAB — PHOSPHORUS: PHOSPHORUS: 3.4 mg/dL (ref 2.5–4.6)

## 2018-07-31 MED ORDER — CLONIDINE HCL 0.2 MG/24HR TD PTWK
0.2000 mg | MEDICATED_PATCH | TRANSDERMAL | Status: DC
  Administered 2018-07-31: 0.2 mg via TRANSDERMAL
  Filled 2018-07-31: qty 1

## 2018-07-31 MED ORDER — PROPOFOL 1000 MG/100ML IV EMUL
80.0000 ug/kg/min | INTRAVENOUS | Status: DC
  Administered 2018-07-31: 50 ug/kg/min via INTRAVENOUS
  Administered 2018-07-31 (×2): 33 ug/kg/min via INTRAVENOUS
  Administered 2018-08-01: 50 ug/kg/min via INTRAVENOUS
  Administered 2018-08-01: 80 ug/kg/min via INTRAVENOUS
  Administered 2018-08-01: 10 ug/kg/min via INTRAVENOUS
  Administered 2018-08-01: 50 ug/kg/min via INTRAVENOUS
  Administered 2018-08-01: 60 ug/kg/min via INTRAVENOUS
  Filled 2018-07-31 (×9): qty 100

## 2018-07-31 NOTE — Progress Notes (Addendum)
Subjective: No twitching seen per RN. On Versed gtt at 5 mg/hr. LTM running.   Objective: Current vital signs: BP (!) 175/91 (BP Location: Left Leg)   Pulse 85   Temp 98.2 F (36.8 C) (Oral)   Resp 16   Ht _0  (1.905 m)   Wt 83 kg   SpO2 100%   BMI 22.87 kg/m  Vital signs in last 24 hours: Temp:  [97.5 F (36.4 C)-98.4 F (36.9 C)] 98.2 F (36.8 C) (03/15 0800) Pulse Rate:  [66-85] 85 (03/15 0800) Resp:  [16-18] 16 (03/15 0800) BP: (139-184)/(68-94) 175/91 (03/15 0800) SpO2:  [100 %] 100 % (03/15 0800) FiO2 (%):  [40 %] 40 % (03/15 0815)  Intake/Output from previous day: 03/14 0701 - 03/15 0700 In: 2551.9 [I.V.:1292.3; IV Piggyback:1259.6] Out: 675 [Urine:675] Intake/Output this shift: Total I/O In: 54.6 [I.V.:54.6] Out: -  Nutritional status:  Diet Order            Diet NPO time specified  Diet effective now             HEENT: [place]/At Lungs: Intubated Ext: No cyanosis noted. Skin to distal lower extremities is dry/scaling.   Neurologic Exam: On Versed gtt at 5 mg/hr Ment: Intubated and sedated. No eye opening, no purposeful movement, no attempts to communicate.   CN: Hippus noted on assessment of pupillary light reflex; pupils are symmetric and 2-3 mm. No blink to threat. Corneal reflexes are briskly intact. No doll's eye reflex (on Versed at 5 mg/hr). Eyes conjugate at the midline without nystagmus. Face flaccidly symmetric. No response to loud clap or voice.  Motor/Sensory: LUE: Mildly increased extensor tone. No movement to sternal rub. Flaccid tone RUE with no movement to sternal rub. Increased tone LLE. No movement to plantar stimulation.  Flaccid tone RLE.  Reflexes: Hypoactive patellae. Toes are mute bilaterally  Lab Results: Results for orders placed or performed during the hospital encounter of 08/07/2018 (from the past 48 hour(s))  Lactic acid, plasma     Status: None   Collection Time: 07/29/18 12:05 PM  Result Value Ref Range   Lactic Acid,  Venous 0.5 0.5 - 1.9 mmol/L    Comment: Performed at [facility], [address] [place], [place] [address]  Lactic acid, plasma     Status: None   Collection Time: 07/29/18  2:43 PM  Result Value Ref Range   Lactic Acid, Venous 0.7 0.5 - 1.9 mmol/L    Comment: Performed at [facility] [address], [place], [place] [address]  I-STAT 7, (LYTES, BLD GAS, ICA, H+H)     Status: Abnormal   Collection Time: 07/29/18  3:08 PM  Result Value Ref Range   pH, Arterial 7.327 (L) 7.350 - 7.450   pCO2 arterial 32.6 32.0 - 48.0 mmHg   pO2, Arterial 166.0 (H) 83.0 - 108.0 mmHg   Bicarbonate 17.0 (L) 20.0 - 28.0 mmol/L   TCO2 18 (L) 22 - 32 mmol/L   O2 Saturation 99.0 %   Acid-base deficit 8.0 (H) 0.0 - 2.0 mmol/L   Sodium 147 (H) 135 - 145 mmol/L   Potassium 3.5 3.5 - 5.1 mmol/L   Calcium, Ion 1.37 1.15 - 1.40 mmol/L   HCT 31.0 (L) 39.0 - 52.0 %   Hemoglobin 10.5 (L) 13.0 - 17.0 g/dL   Patient temperature 99.2 F    Collection site RADIAL, ALLEN'S TEST ACCEPTABLE    Drawn by Operator    Sample type ARTERIAL  Glucose, capillary     Status: Abnormal   Collection Time: 07/29/18  3:15 PM  Result Value Ref Range   Glucose-Capillary 107 (H) 70 - 99 mg/dL  Culture, respiratory (non-expectorated)     Status: None (Preliminary result)   Collection Time: 07/29/18  3:20 PM  Result Value Ref Range   Specimen Description TRACHEAL ASPIRATE    Special Requests NONE    Gram Stain      MODERATE WBC PRESENT,BOTH PMN AND MONONUCLEAR NO ORGANISMS SEEN    Culture      NO GROWTH < 24 HOURS Performed at [facility], [place] [address] [place], [place] [address]    Report Status PENDING   Basic metabolic panel     Status: Abnormal   Collection Time: 07/30/18  6:26 AM  Result Value Ref Range   Sodium 147 (H) 135 - 145 mmol/L   Potassium 3.5 3.5 - 5.1 mmol/L   Chloride 115 (H) 98 - 111 mmol/L   CO2 16 (L) 22 - 32 mmol/L   Glucose, Bld 142 (H) 70 - 99 mg/dL   BUN 85 (H) 8 - 23 mg/dL    Creatinine, Ser 2.64 (H) 0.61 - 1.24 mg/dL   Calcium 9.9 8.9 - 10.3 mg/dL   GFR calc non Af Amer 23 (L) >60 mL/min   GFR calc Af Amer 26 (L) >60 mL/min   Anion gap 16 (H) 5 - 15    Comment: Performed at [facility] [address], [place], [place] [address]  CBC     Status: Abnormal   Collection Time: 07/30/18  6:26 AM  Result Value Ref Range   WBC 29.5 (H) 4.0 - 10.5 K/uL   RBC 3.59 (L) 4.22 - 5.81 MIL/uL   Hemoglobin 8.6 (L) 13.0 - 17.0 g/dL   HCT 30.7 (L) 39.0 - 52.0 %   MCV 85.5 80.0 - 100.0 fL   MCH 24.0 (L) 26.0 - 34.0 pg   MCHC 28.0 (L) 30.0 - 36.0 g/dL   RDW 18.1 (H) 11.5 - 15.5 %   Platelets 162 150 - 400 K/uL   nRBC 0.7 (H) 0.0 - 0.2 %    Comment: Performed at [facility], [place] [address], [place], [place] [address]  Brain natriuretic peptide     Status: None   Collection Time: 07/30/18  6:26 AM  Result Value Ref Range   B Natriuretic Peptide 44.9 0.0 - 100.0 pg/mL    Comment: Performed at [facility] [address], [place], [place] [address]  Procalcitonin     Status: None   Collection Time: 07/30/18  6:26 AM  Result Value Ref Range   Procalcitonin 2.02 ng/mL    Comment:        Interpretation: PCT > 2 ng/mL: Systemic infection (sepsis) is likely, unless other causes are known. (NOTE)       Sepsis PCT Algorithm           Lower Respiratory Tract                                      Infection PCT Algorithm    ----------------------------     ----------------------------         PCT < 0.25 ng/mL                PCT < 0.10 ng/mL  Strongly encourage             Strongly discourage   discontinuation of antibiotics    initiation of antibiotics    ----------------------------     -----------------------------       PCT 0.25 - 0.50 ng/mL            PCT 0.10 - 0.25 ng/mL               OR       >80% decrease in PCT            Discourage initiation of                                            antibiotics      Encourage discontinuation            of antibiotics    ----------------------------     -----------------------------         PCT >= 0.50 ng/mL              PCT 0.26 - 0.50 ng/mL               AND       <80% decrease in PCT              Encourage initiation of                                             antibiotics       Encourage continuation           of antibiotics    ----------------------------     -----------------------------        PCT >= 0.50 ng/mL                  PCT > 0.50 ng/mL               AND         increase in PCT                  Strongly encourage                                      initiation of antibiotics    Strongly encourage escalation           of antibiotics                                     -----------------------------                                           PCT <= 0.25 ng/mL                                                 OR                                        >  80% decrease in PCT                                     Discontinue / Do not initiate                                             antibiotics Performed at [facility], [place] [address] [place], [place] [address]   Glucose, capillary     Status: Abnormal   Collection Time: 07/30/18  7:34 AM  Result Value Ref Range   Glucose-Capillary 113 (H) 70 - 99 mg/dL  Glucose, capillary     Status: None   Collection Time: 07/30/18 11:50 AM  Result Value Ref Range   Glucose-Capillary 98 70 - 99 mg/dL  Glucose, capillary     Status: Abnormal   Collection Time: 07/30/18  4:05 PM  Result Value Ref Range   Glucose-Capillary 111 (H) 70 - 99 mg/dL  Glucose, capillary     Status: Abnormal   Collection Time: 07/30/18  7:57 PM  Result Value Ref Range   Glucose-Capillary 126 (H) 70 - 99 mg/dL   Comment 1 Notify RN   Basic metabolic panel     Status: Abnormal   Collection Time: 07/31/18  4:11 AM  Result Value Ref Range   Sodium 148 (H) 135 - 145 mmol/L   Potassium 3.6 3.5 - 5.1 mmol/L   Chloride 118 (H) 98 - 111 mmol/L    CO2 17 (L) 22 - 32 mmol/L   Glucose, Bld 123 (H) 70 - 99 mg/dL   BUN 80 (H) 8 - 23 mg/dL   Creatinine, Ser 2.55 (H) 0.61 - 1.24 mg/dL   Calcium 9.2 8.9 - 10.3 mg/dL   GFR calc non Af Amer 23 (L) >60 mL/min   GFR calc Af Amer 27 (L) >60 mL/min   Anion gap 13 5 - 15    Comment: Performed at [facility], [place] [address], [place],  [address]  Procalcitonin     Status: None   Collection Time: 07/31/18  4:11 AM  Result Value Ref Range   Procalcitonin 1.57 ng/mL    Comment:        Interpretation: PCT > 0.5 ng/mL and <= 2 ng/mL: Systemic infection (sepsis) is possible, but other conditions are known to elevate PCT as well. (NOTE)       Sepsis PCT Algorithm           Lower Respiratory Tract                                      Infection PCT Algorithm    ----------------------------     ----------------------------         PCT < 0.25 ng/mL                PCT < 0.10 ng/mL         Strongly encourage             Strongly discourage   discontinuation of antibiotics    initiation of antibiotics    ----------------------------     -----------------------------       PCT 0.25 - 0.50 ng/mL  PCT 0.10 - 0.25 ng/mL               OR       >80% decrease in PCT            Discourage initiation of                                            antibiotics      Encourage discontinuation           of antibiotics    ----------------------------     -----------------------------         PCT >= 0.50 ng/mL              PCT 0.26 - 0.50 ng/mL                AND       <80% decrease in PCT             Encourage initiation of                                             antibiotics       Encourage continuation           of antibiotics    ----------------------------     -----------------------------        PCT >= 0.50 ng/mL                  PCT > 0.50 ng/mL               AND         increase in PCT                  Strongly encourage                                      initiation of  antibiotics    Strongly encourage escalation           of antibiotics                                     -----------------------------                                           PCT <= 0.25 ng/mL                                                 OR                                        > 80% decrease in PCT  Discontinue / Do not initiate                                             antibiotics Performed at [facility], [place] [address] [place], [place] [address]   Magnesium     Status: Abnormal   Collection Time: 07/31/18  4:11 AM  Result Value Ref Range   Magnesium 2.5 (H) 1.7 - 2.4 mg/dL    Comment: Performed at [facility] [address] [place], [place] [address]  Phosphorus     Status: None   Collection Time: 07/31/18  4:11 AM  Result Value Ref Range   Phosphorus 3.4 2.5 - 4.6 mg/dL    Comment: Performed at [facility] [address] [place], [place] [address]  CBC     Status: Abnormal   Collection Time: 07/31/18  5:49 AM  Result Value Ref Range   WBC 35.7 (H) 4.0 - 10.5 K/uL   RBC 3.49 (L) 4.22 - 5.81 MIL/uL   Hemoglobin 8.8 (L) 13.0 - 17.0 g/dL   HCT 29.1 (L) 39.0 - 52.0 %   MCV 83.4 80.0 - 100.0 fL   MCH 25.2 (L) 26.0 - 34.0 pg   MCHC 30.2 30.0 - 36.0 g/dL   RDW 18.2 (H) 11.5 - 15.5 %   Platelets 171 150 - 400 K/uL   nRBC 0.8 (H) 0.0 - 0.2 %    Comment: Performed at [facility] [address], [place], [place] [address]    Recent Results (from the past 240 hour(s))  Urine culture     Status: Abnormal   Collection Time: 07/17/2018  8:17 PM  Result Value Ref Range Status   Specimen Description   Final    URINE, RANDOM Performed at [facility] [address] [place], [place] [address]    Special Requests   Final    NONE Performed at [facility], [place] [address] [place], [place] [address]    Culture >=100,000 COLONIES/mL PSEUDOMONAS AERUGINOSA (A)  Final    Report Status 07/26/2018 FINAL  Final   Organism ID, Bacteria PSEUDOMONAS AERUGINOSA (A)  Final      Susceptibility   Pseudomonas aeruginosa - MIC*    CEFTAZIDIME <=1 SENSITIVE Sensitive     CIPROFLOXACIN >=4 RESISTANT Resistant     GENTAMICIN <=1 SENSITIVE Sensitive     IMIPENEM 2 SENSITIVE Sensitive     PIP/TAZO <=4 SENSITIVE Sensitive     CEFEPIME 4 SENSITIVE Sensitive     * >=100,000 COLONIES/mL PSEUDOMONAS AERUGINOSA  Culture, blood (Routine X 2) w Reflex to ID Panel     Status: None   Collection Time: 07/24/18 12:59 AM  Result Value Ref Range Status   Specimen Description   Final    BLOOD RIGHT ARM Performed at [facility], [place] [address] [place], [place] [address]    Special Requests   Final    BOTTLES DRAWN AEROBIC AND ANAEROBIC Blood Culture adequate volume Performed at [facility] [address] [place], [place] [address]    Culture   Final    NO GROWTH 5 DAYS Performed at [facility], [place] [address] [place], [place] [address]    Report Status 07/29/2018 FINAL  Final  Culture, blood (Routine X 2) w Reflex to ID Panel  Status: None   Collection Time: 07/24/18 12:59 AM  Result Value Ref Range Status   Specimen Description   Final    BLOOD LEFT HAND Performed at [facility] [address] [place], [place] [address]    Special Requests   Final    BOTTLES DRAWN AEROBIC AND ANAEROBIC Blood Culture adequate volume Performed at [facility] [address] [place], [place] [address]    Culture   Final    NO GROWTH 5 DAYS Performed at [facility], [place] [address], [place], [place] [address]    Report Status 07/29/2018 FINAL  Final  MRSA PCR Screening     Status: None   Collection Time: 07/24/18  4:49 AM  Result Value Ref Range Status   MRSA by PCR NEGATIVE NEGATIVE Final    Comment:        The GeneXpert MRSA Assay (FDA approved for NASAL specimens only), is one component of  a comprehensive MRSA colonization surveillance program. It is not intended to diagnose MRSA infection nor to guide or monitor treatment for MRSA infections. Performed at [facility]: [place] [address] [address], [place] [address] [place], [place] [address]   Culture, blood (routine x 2)     Status: None (Preliminary result)   Collection Time: 07/28/18  8:54 AM  Result Value Ref Range Status   Specimen Description   Final    BLOOD LEFT HAND Performed at [facility] [address], [place], [place] [address]    Special Requests   Final    BOTTLES DRAWN AEROBIC AND ANAEROBIC Blood Culture adequate volume Performed at [facility] [name], [place], [place] [address]    Culture   Final    NO GROWTH 2 DAYS Performed at [facility] [address] [place], [place] [address]    Report Status PENDING  Incomplete  Culture, blood (routine x 2)     Status: None (Preliminary result)   Collection Time: 07/28/18  8:54 AM  Result Value Ref Range Status   Specimen Description   Final    BLOOD LEFT HAND Performed at [facility] [address], [place], [place] [address]    Special Requests   Final    BOTTLES DRAWN AEROBIC AND ANAEROBIC Blood Culture adequate volume Performed at [facility] [address] [place], [place] [address]    Culture   Final    NO GROWTH 2 DAYS Performed at [facility] [address], [place], [place] [address]    Report Status PENDING  Incomplete  CSF culture     Status: None (Preliminary result)   Collection Time: 07/28/18  3:10 PM  Result Value Ref Range Status   Specimen Description   Final    CSF Performed at [facility] [address], [place], [place] [address]    Special Requests   Final    NONE Performed at [facility], [place] [address], [place], [place] [address]    Gram Stain   Final    NO WBC SEEN CYTOSPIN  SMEAR NO ORGANISMS SEEN Gram Stain Report Called to,Read Back By and Verified With: PENNINGTON,M. RN _0  ON 03.12.2020 BY COHEN,K GRAM STAIN REVIEWED-AGREE WITH RESULT T. TYSOR    Culture   Final    NO GROWTH 2 DAYS Performed at [facility], [place] [address], [place],  [address]    Report Status PENDING  Incomplete  Culture, fungus without smear     Status: None (Preliminary result)   Collection Time: 07/28/18  3:10 PM  Result Value Ref Range Status   Specimen Description   Final    CSF Performed at [facility] [address], [place], [place] [address]    Special Requests   Final    NONE Performed at [facility], [place] [address], [place], [place] [address]    Culture   Final    NO FUNGUS ISOLATED AFTER 2 DAYS Performed at [facility], [place] [address] [place], [place] [address]    Report Status PENDING  Incomplete  Culture, Urine     Status: Abnormal   Collection Time: 07/28/18  3:28 PM  Result Value Ref Range Status   Specimen Description   Final    URINE, RANDOM Performed at [facility] [address] [place], [place] [address]    Special Requests   Final    NONE Performed at [facility], [place] [address] [place], [place] [address]    Culture >=100,000 COLONIES/mL PSEUDOMONAS AERUGINOSA (A)  Final   Report Status 07/30/2018 FINAL  Final   Organism ID, Bacteria PSEUDOMONAS AERUGINOSA (A)  Final      Susceptibility   Pseudomonas aeruginosa - MIC*    CEFTAZIDIME <=1 SENSITIVE Sensitive     CIPROFLOXACIN >=4 RESISTANT Resistant     GENTAMICIN <=1 SENSITIVE Sensitive     IMIPENEM 2 SENSITIVE Sensitive     PIP/TAZO <=4 SENSITIVE Sensitive     CEFEPIME 4 SENSITIVE Sensitive     * >=100,000 COLONIES/mL PSEUDOMONAS AERUGINOSA  MRSA PCR Screening     Status: None   Collection Time: 07/28/18  4:04 PM  Result Value Ref Range Status   MRSA by PCR NEGATIVE NEGATIVE Final     Comment:        The GeneXpert MRSA Assay (FDA approved for NASAL specimens only), is one component of a comprehensive MRSA colonization surveillance program. It is not intended to diagnose MRSA infection nor to guide or monitor treatment for MRSA infections. Performed at [facility], [place] [address], [place], [place] [address]   Culture, respiratory (non-expectorated)     Status: None (Preliminary result)   Collection Time: 07/29/18  3:20 PM  Result Value Ref Range Status   Specimen Description TRACHEAL ASPIRATE  Final   Special Requests NONE  Final   Gram Stain   Final    MODERATE WBC PRESENT,BOTH PMN AND MONONUCLEAR NO ORGANISMS SEEN    Culture   Final    NO GROWTH < 24 HOURS Performed at [facility], [place] [address] [place], [place] [address]    Report Status PENDING  Incomplete    Lipid Panel No results for input(s): CHOL, TRIG, HDL, CHOLHDL, VLDL, LDLCALC in the last 72 hours.  Studies/Results: Portable Chest X-ray  Result Date: 07/29/2018 CLINICAL DATA:  Intubation. EXAM: PORTABLE CHEST 1 VIEW COMPARISON:  07/29/2018. FINDINGS: Endotracheal tube tip noted with tip 4 cm above the carina. NG tube noted with tip projected over the distal stomach. Mild gastric distention noted. Bilateral interstitial prominence and bibasilar atelectasis again noted. No pleural effusion or pneumothorax. Stable cardiomegaly. No acute bony abnormality. Thoracic spine scoliosis. IMPRESSION: 1. Endotracheal tube noted with its tip 4 cm above the carina. NG tube noted with tip projected over the distal stomach. Mild gastric distention noted. 2. Bilateral interstitial prominence and bibasilar atelectasis again noted.  3.  Stable cardiomegaly. Electronically Signed   By: Marcello Moores  Register   On: 07/29/2018 15:06    Medications:  Scheduled: . chlorhexidine gluconate (MEDLINE KIT)  15 mL Mouth Rinse BID  . cloNIDine  0.2 mg Transdermal Weekly  . clotrimazole  1 application  Topical BID  . famotidine (PEPCID) IV  20 mg Intravenous Q24H  . heparin  5,000 Units Subcutaneous Q8H  . hydrocortisone cream  1 application Topical BID  . mouth rinse  15 mL Mouth Rinse 10 times per day   Continuous: . dextrose 5 % and 0.9% NaCl 50 mL/hr at 07/31/18 0800  . levETIRAcetam 500 mg (07/31/18 0919)  . linezolid (ZYVOX) IV Stopped (07/31/18 0504)  . midazolam 5 mg/hr (07/31/18 0955)  . valproate sodium Stopped (07/31/18 0624)    LTM EEG report, 3/15: This wasa moderately abnormal continuous video EEG due to diffuse background slowing, indicative of a non-specific encephalopathy pattern. No further GPDs or epileptiform discharges were seen. Events of neck jerking had no EEG correlate.    Assessment: 78 year old male with encephalopathy and epileptic seizure activity in the setting ofpseudomonas complicated UTI as well ashigh-dose cefepime. 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 This had resolved with low-dose Versed gtt (5 mg/hr), but recurred yesterday, requiring reinitiation of low-dose Versed gtt. Given clinical seizure activity recurrence yesterday after low-dose Versed infusion was stopped, I discussed with his wife the option of burst suppression protocol to increase the likelihood of breaking the recurrent seizure activity after sedation is weaned, this time after a longer period of 48 hours.  2. Cefepime was held and patient transferred to [facility] on 07/28/2018.Cefepime-induced neurotoxicity was relatively high on the DDx for etiology of his encephalopathy and seizure activity; ongoing neurotoxicity is still on the DDx due to impaired clearance of meds in the setting of renal failure, given continued lack of improvement 3 days after cefepime was stopped. Azotemia secondary to  renal failure as well as other etiologies contributing to multifactorial encephalopathy (sepsis, postictal state, metabolic acidosis, lingering sedating meds) also on the DDx.  3.MRI brain showed no acute abnormality.Imageswere personally reviewed and nothing was seen on MRI that could explainthe patient'ssymptoms.  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 5.LP results were not consistent with a meningitis or an encephalitis.  Recommendations: --ContinueKeppra 500 twice daily. Would not increase further due to renal disease.  -- Continue valproic acid at scheduled dose of 10 mg/kg IV TID. LFTs and ammonia level are normal. Obtaining serum level.  --Monitor and correct metabolic derangements  --Start burst suppression protocol with propofol gtt at 33 mcg/kg/min. Risks/benefits discussed with family, who expressed understanding and consent to proceed. Risks discussed included, but were not restricted to, inability to wean off the ventilator, failure of treatment to stop seizures and long-wash out period. Propofol has been ordered and RN notified.  -- Discontinue Versed gtt.   40 minutes spent in the neurological evaluation and management of this critically ill patient. Time spent included EEG review and discussion of plan of care with his family.   Addendum, 9:55 PM: LTM EEG with decreased voltage, but not in a burst suppression pattern. Increasing propofol gtt to 50 mcg/kg/min. No jerking or left face, neck or shoulder seen on bedside evaluation. RN also reports no jerking.    LOS: 7 days   @  Electronically signed: Dr. Kerney Elbe 07/31/2018  10:04 AM

## 2018-07-31 NOTE — Procedures (Signed)
LTM-EEG Report  HISTORY: Continuous video-EEG monitoring performed for 78 year old with sepsis, myoclonus.  ACQUISITION: International 10-20 system for electrode placement; 18 channels with additional eyes linked to ipsilateral ears and EKG. Additional T1-T2 electrodes were used. Continuous video recording obtained.   EEG NUMBER:  MEDICATIONS:  Day 2: see EMR   DAY #2: from 0730 07/30/18 to 0730 07/31/18   BACKGROUND: An overall medium voltage continuous recording with poor spontaneous variability and reactivity. The background consisted of medium voltage 1-5Hz  activity diffusely without evidence of a posterior dominant rhythm. State changes were seen but without sleep architecture. Some increased periods of attenuation were observed in the evening after increased boluses of sedation.   EPILEPTIFORM/PERIODIC ACTIVITY: none SEIZURES: none EVENTS: subtle neck twitching reported by staff without EEG correlate  EKG: no significant arrhythmia  SUMMARY: This was a moderately abnormal continuous video EEG due to diffuse background slowing, indicative of a non-specific encephalopathy pattern. No further GPDs or epileptiform discharges were seen. Events of neck jerking had no EEG correlate.

## 2018-07-31 NOTE — Progress Notes (Signed)
NAME:  Juan [name], MRN:  [id number], DOB:  Oct 20, 1940, LOS: 7 ADMISSION DATE:  08/13/2018, CONSULTATION DATE:  3/13 REFERRING MD:  Lupita Leash, CHIEF COMPLAINT:  Acute encephalopathy and worsening mental status    Brief History   78 year old male patient admitted initially on 3/7 with sepsis secondary secondary to urinary tract infection with associated acute on chronic renal failure secondary to obstructive uropathy.  Over the course of his hospitalization developed progressive encephalopathy, with concern for nonconvulsive status on 3/12.  This was felt possibly secondary to neurotoxicity from cefepime.  In spite of treating potential seizure stopping antibiotics and continuing supportive care as of 3/13 he remained obtunded and developed worsening hypoxia and work of breathing therefore pulmonary asked to evaluate  History of present illness   78 year old with multiple medical problems initially admitted on 3/7 with chief complaint of Dysuria and back pain.  Reporting pain at level 10 out of 10 in severity in the emergency room was found to have leukocytosis, urinalysis consistent with urinary tract infection and met SIRS criteria.  CT abdomen pelvis showed bilateral hydronephrosis with bladder wall thickening and multiple bladder diverticuli suggesting chronic bladder outlet obstruction with cystitis and urinary tract infection he was admitted to the internal medicine service with a working diagnosis of sepsis secondary to urinary tract source, secondary to obstructive uropathy.  Cultures were obtained and he was started on cefepime.  Urinary cultures came back greater than 100,000 colonies of Cipro resistant Pseudomonas.  On 3/9 he had been started on Pyridium for persistent dysuria.  Diuretics were held due to creatinine bump, Urology was consulted Foley catheter was placed and recommendations from a urology standpoint where that he would require chronic Foley catheter at this point given failed attempts in the  outpatient setting to manage this.  On 3/12 he began Be progressively encephalopathic, CT of head was obtained which was negative, MRI of lumbar spine was obtained to rule out epidural abscess and infectious disease was consulted.  And and lumbar puncture was also ordered, as well as EEG.  EEG showed frequent periodic discharges concerning for triphasic versus epileptiform GPD's and because of this neurology was also consulted and he was transferred to [facility]. New possible seizure-nonconvulsive status it was recommended that cefepime be discontinued given concern for neurotoxicity.  He was subsequently switched from cefepime to Zosyn. On 3/13 noted by nursing staff to have worsening work of breathing with worsening hypoxia and respiratory rate increasing to 20 to 30s.  Rapid response was called.  Chest x-ray was obtained this showed right basilar volume loss and bilateral infiltrates.  Arterial blood gas showed significant metabolic acidosis, he remained lethargic and difficult to arouse and therefore critical care was asked to evaluate  Past Medical History  Chronic urinary retention with prostate hypertrophy, prior Pseudomonas urinary tract infections, stage III chronic kidney disease, moderate to severe pulmonary hypertension, chronic diastolic heart failure, myelodysplasia with myelodysplastic syndrome, chronic back pain  Significant Hospital Events   3/7: Admitted with presumptive diagnosis of urinary tract infection 3/9: worsening renal failure, seen by urology, Foley catheter placed 3/12: Worsening mental status, EEG worrisome for possible nonconvulsive status.  Felt possibly due to neurotoxicity from cefepime this was discontinued and Zosyn was started.  lumbar puncture performed, for possible meningitis.  Infectious disease consulted, neurology consulted, started on Keppra, moved to [facility] for continuous EEG monitoring 3/13 remains unresponsive, developed worsening hypoxia staph from prior LP  unremarkable without evidence of meningitis.  Pulmonary asked to evaluate  Consults:  ID Neurology Urology PCCM  Procedures:   3/12 Fluoro-guided LP performed at L3-L4. Opening pressure 12 cm H20. 13.5 ml clear CSF obtained Significant Diagnostic Tests:  MRI brain 3/12: Truncated and motion degraded examination without visible acute abnormality.  MRI L spine 3/12:1. Mild foraminal narrowing bilaterally at L4-5 is worse on the left.2. Chronic endplate changes at E4-2 with associated Schmorl's node. 3. Minimal rightward disc bulging without significant stenosis.4. 10 mm solid lesion at the upper pole of the right kidney was not well seen on recent CT scans. Small renal cell carcinoma is not excluded. Recommend dedicated MRI of the kidneys as the patient's condition allows. 5. Stable cyst at the lower pole of the right kidney.  US abdomen 3/12: 1. No acute findings. 2. Single visualized gallstone. No evidence of acute cholecystitis. No other abnormality.  Micro Data:  UC 3/7 pseudomonas (cipro resistant) BCx2 3/8>>> BCX2 3/12>>> CSF 3/12: neg resp culture 3/13>>>  Antimicrobials:  Cefepime 3/7>>3/12 Zosyn 3/12>>> zyvox 3/13>>> Interim history/subjective:  No events overnight, remains sedate  Objective   Blood pressure (!) 175/91, pulse 85, temperature 98.2 F (36.8 C), temperature source Oral, resp. rate 16, height '6\' 3"'$  (1.905 m), weight 83 kg, SpO2 100 %.    Vent Mode: PRVC FiO2 (%):  [40 %] 40 % Set Rate:  [16 bmp] 16 bmp Vt Set:  [670 mL] 670 mL PEEP:  [5 cmH20] 5 cmH20 Plateau Pressure:  [15 cmH20-16 cmH20] 15 cmH20   Intake/Output Summary (Last 24 hours) at 07/31/2018 3536 Last data filed at 07/31/2018 0800 Gross per 24 hour  Intake 2339.16 ml  Output 675 ml  Net 1664.16 ml   Filed Weights   07/24/18 0155 07/30/18 0200 07/30/18 0400  Weight: 82.7 kg 83 kg 83 kg   Examination: General: Acutely ill appearing male, sedate HENT: [place]/AT, PERRL, EOM-I  and MMM Lungs: CTA bilaterally Cardiovascular: RRR, Nl S1/S2 and -M/R/G Abdomen: Soft, NT, ND and +BS Extremities: -edema and -tenderness Neuro: No significant response even to noxious stimulus or painful stimulus GU: Clear yellow  Resolved Hospital Problem list    Assessment & Plan:   Acute metabolic encephalopathy.  likely multifactorial: sepsis, possible non-convulsive seizure 2/2 neurotoxicity from cefepime, metabolic acidosis, renal failure and worsening azotemia.  Wonder if he is postictal currently Plan EEG per neurology Address metabolic issues Supportive care Minimize sedating medications   Acute hypoxic respiratory failure 2/2 evolving pulmonary infiltrates and atx super imposed on underlying severe PH and OSA  chest x-ray personally reviewed: Is elevated right hemidiaphragm, bilateral infiltrates, suspect hypoxia and work of breathing secondary to possible aspiration and atelectasis -He is not protecting his airway Plan Full vent support PAD protocol RASS goal 0 VAP bundle Titrate O2 for sat of 88-92%  Acute on chronic renal failure (st III CKD) 2/2 chronic urinary retention w/ h/o poorly functioning bladder, enlarged prostate and mild to mod obstruction. Has had multiple recurrent UTIs. Has not completed treatment in past d/t financial restraints.  Plan Avoid nephrotoxins Levophed as needed KVO IVF Strict intake output Renal dose medications  Fluid, electrolyte and acid base imbalance: hyperchloremia amd Non-anion gap metabolic acidosis Plan KVO IVF Serial chemistries BMET in AM Replace electrolytes as indicated  Complicated Pseudomonas Urinary tract infection in setting of obstructive uropathy Plan Will need chronic Foley catheter D/C zosyn 3/14  On-going SIRS/sepsis UT infection +/- new aspiration  Plan Trend cbc and fever curve Abx per above Checking resp culture  Solid lesion on upper pole of right kidney.  Plan Will need specific MRI if  improves.  H/o myelodysplastic syndrome CT abdomen imaging also noting innumerable lytic lesions involving the spine Plan Follow-up oncology outpatient  Iron def anemia Plan Trend cbc Transfuse per protocol  Will await neurology's lead on when to start weaning  Best practice:  Diet: NPO Pain/Anxiety/Delirium protocol (if indicated): 3/13 VAP protocol (if indicated): 3/13 DVT prophylaxis: [place] heparin  GI prophylaxis: H2B Glucose control: ssi Mobilitity BR Code Status: full code  Family Communication: pending Disposition: Adequately ill requires transfer to the intensive care for airway protection mechanical ventilation continuous EEG monitoring with possible titration of AEDs.  Also in light of worsening renal function may need central access and hemodynamic support  Labs   CBC: Recent Labs  Lab 07/28/18 0355 07/28/18 1625 07/29/18 0933 07/29/18 1508 07/30/18 0626 07/31/18 0549  WBC 27.9* 32.3* 31.9*  --  29.5* 35.7*  NEUTROABS  --   --  12.2*  --   --   --   HGB 8.4* 8.9* 8.4* 10.5* 8.6* 8.8*  HCT 29.0* 32.2* 29.8* 31.0* 30.7* 29.1*  MCV 86.1 87.5 84.7  --  85.5 83.4  PLT 141* 156 175  --  162 540    Basic Metabolic Panel: Recent Labs  Lab 07/25/18 0650  07/28/18 0355 07/28/18 1625 07/29/18 0933 07/29/18 1508 07/30/18 0626 07/31/18 0411  NA 137   < > 141 144 145 147* 147* 148*  K 3.6   < > 4.0 3.8 3.8 3.5 3.5 3.6  CL 104   < > 110 113* 116*  --  115* 118*  CO2 22   < > 17* 19* 18*  --  16* 17*  GLUCOSE 120*   < > 103* 97 91  --  142* 123*  BUN 64*   < > 89* 87* 85*  --  85* 80*  CREATININE 1.71*   < > 2.02* 1.98* 2.39*  --  2.64* 2.55*  CALCIUM 9.7   < > 9.2 9.5 9.4  --  9.9 9.2  MG 2.4  --   --   --   --   --   --  2.5*  PHOS  --   --   --   --   --   --   --  3.4   < > = values in this interval not displayed.   GFR: Estimated Creatinine Clearance: 28.9 mL/min (A) (by C-G formula based on SCr of 2.55 mg/dL (H)). Recent Labs  Lab 07/28/18 1625  07/29/18 0933 07/29/18 1205 07/29/18 1443 07/30/18 0626 07/31/18 0411 07/31/18 0549  PROCALCITON  --  2.52  --   --  2.02 1.57  --   WBC 32.3* 31.9*  --   --  29.5*  --  35.7*  LATICACIDVEN  --   --  0.5 0.7  --   --   --     Liver Function Tests: Recent Labs  Lab 07/27/18 1906 07/28/18 0355 07/28/18 1625 07/29/18 0933  AST '23 26 26 24  '$ ALT '13 14 13 13  '$ ALKPHOS 84 81 90 88  BILITOT 1.0 0.8 0.9 0.8  PROT 6.1* 6.2* 6.2* 5.5*  ALBUMIN 2.6* 2.4* 2.5* 2.0*   No results for input(s): LIPASE, AMYLASE in the last 168 hours. Recent Labs  Lab 07/27/18 1209  AMMONIA 18    ABG    Component Value Date/Time   PHART 7.327 (L) 07/29/2018 1508   PCO2ART 32.6 07/29/2018 1508   PO2ART 166.0 (H) 07/29/2018 1508  HCO3 17.0 (L) 07/29/2018 1508   TCO2 18 (L) 07/29/2018 1508   ACIDBASEDEF 8.0 (H) 07/29/2018 1508   O2SAT 99.0 07/29/2018 1508     Coagulation Profile: No results for input(s): INR, PROTIME in the last 168 hours.  Cardiac Enzymes: No results for input(s): CKTOTAL, CKMB, CKMBINDEX, TROPONINI in the last 168 hours.  HbA1C: Hgb A1c MFr Bld  Date/Time Value Ref Range Status  08/18/2017 02:47 AM 5.1 4.8 - 5.6 % Final    Comment:    (NOTE) Pre diabetes:          5.7%-6.4% Diabetes:              >6.4% Glycemic control for   <7.0% adults with diabetes   07/28/2016 12:18 PM 5.8 4.6 - 6.5 % Final    Comment:    Glycemic Control Guidelines for People with Diabetes:Non Diabetic:  <6%Goal of Therapy: <7%Additional Action Suggested:  >8%     CBG: Recent Labs  Lab 07/29/18 1515 07/30/18 0734 07/30/18 1150 07/30/18 1605 07/30/18 1957  GLUCAP 107* 113* 98 111* 126*   The patient is critically ill with multiple organ systems failure and requires high complexity decision making for assessment and support, frequent evaluation and titration of therapies, application of advanced monitoring technologies and extensive interpretation of multiple databases.   Critical Care  Time devoted to patient care services described in this note is  33  Minutes. This time reflects time of care of this signee Dr Jennet Maduro. This critical care time does not reflect procedure time, or teaching time or supervisory time of PA/NP/Med student/Med Resident etc but could involve care discussion time.  Rush Farmer, M.D. [facility] Pulmonary/Critical Care Medicine. Pager: [phone]. After hours pager: [phone].

## 2018-07-31 NOTE — Progress Notes (Signed)
LTM EEG checked, no skin breakdown noted on frontal leads.

## 2018-08-01 ENCOUNTER — Inpatient Hospital Stay (HOSPITAL_COMMUNITY): Payer: Medicare Other

## 2018-08-01 LAB — GLUCOSE, CAPILLARY
GLUCOSE-CAPILLARY: 98 mg/dL (ref 70–99)
Glucose-Capillary: 129 mg/dL — ABNORMAL HIGH (ref 70–99)
Glucose-Capillary: 73 mg/dL (ref 70–99)
Glucose-Capillary: 78 mg/dL (ref 70–99)
Glucose-Capillary: 80 mg/dL (ref 70–99)
Glucose-Capillary: 88 mg/dL (ref 70–99)
Glucose-Capillary: 92 mg/dL (ref 70–99)

## 2018-08-01 LAB — PHOSPHORUS: Phosphorus: 3.8 mg/dL (ref 2.5–4.6)

## 2018-08-01 LAB — POCT I-STAT 7, (LYTES, BLD GAS, ICA,H+H)
Acid-base deficit: 8 mmol/L — ABNORMAL HIGH (ref 0.0–2.0)
Bicarbonate: 17.6 mmol/L — ABNORMAL LOW (ref 20.0–28.0)
Calcium, Ion: 1.37 mmol/L (ref 1.15–1.40)
HCT: 25 % — ABNORMAL LOW (ref 39.0–52.0)
Hemoglobin: 8.5 g/dL — ABNORMAL LOW (ref 13.0–17.0)
O2 Saturation: 99 %
PCO2 ART: 34.7 mmHg (ref 32.0–48.0)
PO2 ART: 165 mmHg — AB (ref 83.0–108.0)
Patient temperature: 98.6
Potassium: 3 mmol/L — ABNORMAL LOW (ref 3.5–5.1)
Sodium: 149 mmol/L — ABNORMAL HIGH (ref 135–145)
TCO2: 19 mmol/L — ABNORMAL LOW (ref 22–32)
pH, Arterial: 7.314 — ABNORMAL LOW (ref 7.350–7.450)

## 2018-08-01 LAB — BASIC METABOLIC PANEL
Anion gap: 10 (ref 5–15)
BUN: 81 mg/dL — ABNORMAL HIGH (ref 8–23)
CO2: 14 mmol/L — AB (ref 22–32)
Calcium: 8.9 mg/dL (ref 8.9–10.3)
Chloride: 121 mmol/L — ABNORMAL HIGH (ref 98–111)
Creatinine, Ser: 3.04 mg/dL — ABNORMAL HIGH (ref 0.61–1.24)
GFR calc Af Amer: 22 mL/min — ABNORMAL LOW (ref >60)
GFR calc non Af Amer: 19 mL/min — ABNORMAL LOW (ref >60)
Glucose, Bld: 133 mg/dL — ABNORMAL HIGH (ref 70–99)
Potassium: 4 mmol/L (ref 3.5–5.1)
Sodium: 145 mmol/L (ref 135–145)

## 2018-08-01 LAB — CSF CULTURE W GRAM STAIN
Culture: NO GROWTH
Gram Stain: NONE SEEN

## 2018-08-01 LAB — CBC
HCT: 26.9 % — ABNORMAL LOW (ref 39.0–52.0)
Hemoglobin: 7.6 g/dL — ABNORMAL LOW (ref 13.0–17.0)
MCH: 24.1 pg — ABNORMAL LOW (ref 26.0–34.0)
MCHC: 28.3 g/dL — ABNORMAL LOW (ref 30.0–36.0)
MCV: 85.4 fL (ref 80.0–100.0)
Platelets: 165 10*3/uL (ref 150–400)
RBC: 3.15 MIL/uL — ABNORMAL LOW (ref 4.22–5.81)
RDW: 18.6 % — AB (ref 11.5–15.5)
WBC: 35 10*3/uL — ABNORMAL HIGH (ref 4.0–10.5)
nRBC: 0.5 % — ABNORMAL HIGH (ref 0.0–0.2)

## 2018-08-01 LAB — MAGNESIUM: Magnesium: 2.4 mg/dL (ref 1.7–2.4)

## 2018-08-01 MED ORDER — HYDRALAZINE HCL 50 MG PO TABS
75.0000 mg | ORAL_TABLET | Freq: Three times a day (TID) | ORAL | Status: DC
  Administered 2018-08-01 – 2018-08-11 (×32): 75 mg via ORAL
  Filled 2018-08-01 (×35): qty 1

## 2018-08-01 MED ORDER — SODIUM CHLORIDE 0.9 % IV SOLN
200.0000 mg | Freq: Two times a day (BID) | INTRAVENOUS | Status: DC
  Administered 2018-08-01 – 2018-08-03 (×5): 200 mg via INTRAVENOUS
  Filled 2018-08-01 (×6): qty 20

## 2018-08-01 MED ORDER — LORAZEPAM 2 MG/ML IJ SOLN
2.0000 mg | INTRAMUSCULAR | Status: AC
  Administered 2018-08-01: 2 mg via INTRAVENOUS

## 2018-08-01 MED ORDER — PIPERACILLIN-TAZOBACTAM 3.375 G IVPB
3.3750 g | Freq: Three times a day (TID) | INTRAVENOUS | Status: DC
  Administered 2018-08-01 – 2018-08-04 (×9): 3.375 g via INTRAVENOUS
  Filled 2018-08-01 (×11): qty 50

## 2018-08-01 MED ORDER — LORAZEPAM 2 MG/ML IJ SOLN
INTRAMUSCULAR | Status: AC
  Filled 2018-08-01: qty 1

## 2018-08-01 MED ORDER — HYDRALAZINE HCL 20 MG/ML IJ SOLN
10.0000 mg | Freq: Four times a day (QID) | INTRAMUSCULAR | Status: DC | PRN
  Administered 2018-08-05: 10 mg via INTRAVENOUS
  Filled 2018-08-01 (×2): qty 1

## 2018-08-01 MED ORDER — PIVOT 1.5 CAL PO LIQD
1000.0000 mL | ORAL | Status: DC
  Administered 2018-08-01: 1000 mL
  Filled 2018-08-01 (×3): qty 1000

## 2018-08-01 MED ORDER — FUROSEMIDE 10 MG/ML IJ SOLN
40.0000 mg | Freq: Every day | INTRAMUSCULAR | Status: DC
  Administered 2018-08-01 – 2018-08-03 (×3): 40 mg via INTRAVENOUS
  Filled 2018-08-01 (×3): qty 4

## 2018-08-01 NOTE — Progress Notes (Signed)
eLink Physician-Brief Progress Note Patient Name: Takoda Janowiak DOB: 06/08/1940 MRN: [id number]   Date of Service  08/01/2018  HPI/Events of Note  Notified of BP trending up with SBP >170.  Patient does not appear to be in pain  eICU Interventions  Ordered hydralazine 10 mg IV prn for SBP >170     Intervention Category Major Interventions: Hypertension - evaluation and management  Shona Needles Cyris Maalouf 08/01/2018, 2:22 AM

## 2018-08-01 NOTE — Procedures (Signed)
LTM-EEG Report  HISTORY: Continuous video-EEG monitoring performed for61year old with sepsis, myoclonus. ACQUISITION: International 10-20 system for electrode placement; 18 channels with additional eyes linked to ipsilateral ears and EKG. Additional T1-T2 electrodes were used. Continuous video recording obtained.   EEG NUMBER:  MEDICATIONS:  Day 3:see EMR  DAY #3:from 0730 3/15/20to 0730 08/01/18  BACKGROUND: An overall medium voltage continuous recording withpoorspontaneous variability and reactivity.Thebackground consisted of medium voltage2-6Hz activity diffusely without evidence of aposterior dominant rhythm. State changes were seen but without sleep architecture. Rare, brief periods of voltage attenuation were seen, decreasing with arousal.  EPILEPTIFORM/PERIODIC ACTIVITY:none SEIZURES:none EVENTS:none reported  EKG: no significant arrhythmia  SUMMARY: This wasa moderately abnormal continuous video EEG due to diffuse background slowing, indicative of a non-specific encephalopathy pattern. No further GPDs or epileptiform discharges were seen.

## 2018-08-01 NOTE — Progress Notes (Signed)
NAME:  Juan [name], MRN:  [id number], DOB:  Dec 20, 1940, LOS: 8 ADMISSION DATE:  07/29/2018, CONSULTATION DATE:  3/13 REFERRING MD:  Lupita Leash, CHIEF COMPLAINT:  Acute encephalopathy and worsening mental status    Brief History   78 year old male patient admitted initially on 3/7 with sepsis secondary secondary to urinary tract infection with associated acute on chronic renal failure secondary to obstructive uropathy.  Over the course of his hospitalization developed progressive encephalopathy, with concern for nonconvulsive status on 3/12.  This was felt possibly secondary to neurotoxicity from cefepime.  In spite of treating potential seizure stopping antibiotics and continuing supportive care as of 3/13 he remained obtunded and developed worsening hypoxia and work of breathing therefore pulmonary asked to evaluate    Past Medical History  Chronic urinary retention with prostate hypertrophy, prior Pseudomonas urinary tract infections, stage III chronic kidney disease, moderate to severe pulmonary hypertension, chronic diastolic heart failure, myelodysplasia with myelodysplastic syndrome, chronic back pain  Significant Hospital Events   3/7: Admitted with presumptive diagnosis of urinary tract infection 3/9: worsening renal failure, seen by urology, Foley catheter placed 3/12: Worsening mental status, EEG worrisome for possible nonconvulsive status.  Felt possibly due to neurotoxicity from cefepime this was discontinued and Zosyn was started.  lumbar puncture performed, for possible meningitis.  Infectious disease consulted, neurology consulted, started on Keppra, moved to [facility] for continuous EEG monitoring 3/13 remains unresponsive, developed worsening hypoxia staph from prior LP unremarkable without evidence of meningitis.  Pulmonary asked to evaluate  Consults:   ID Neurology Urology PCCM  Procedures:   3/12 Fluoro-guided LP performed at L3-L4. Opening pressure 12 cm H20. 13.5 ml clear CSF  obtained Significant Diagnostic Tests:  CT abdomen pelvis 3/7 showed bilateral hydronephrosis with bladder wall thickening and multiple bladder diverticuli suggesting chronic bladder outlet obstruction with cystitis MRI brain 3/12: Truncated and motion degraded examination without visible acute abnormality.  MRI L spine 3/12:1. Mild foraminal narrowing bilaterally at L4-5 is worse on the left.2. Chronic endplate changes at J4-7 with associated Schmorl's node. 3. Minimal rightward disc bulging without significant stenosis.4. 10 mm solid lesion at the upper pole of the right kidney was not well seen on recent CT scans. Small renal cell carcinoma is not excluded. Recommend dedicated MRI of the kidneys as the patient's condition allows. 5. Stable cyst at the lower pole of the right kidney.  EEG 3/12 frequent periodic discharges concerning for triphasic versus epileptiform GPD's US abdomen 3/12: 1. No acute findings. 2. Single visualized gallstone. No evidence of acute cholecystitis. No other abnormality.  Micro Data:  UC 3/7 pseudomonas (cipro resistant) Urine cx 3/12 >> pseudomonas BCx2 3/8>>> neg BCX2 3/12>>>neg CSF 3/12: neg resp culture 3/13>>>nml flora  Antimicrobials:  Cefepime 3/7>>3/12 Zosyn 3/12>>> zyvox 3/13>>> Interim history/subjective:  Afebrile, critically ill, intubated In induced coma with propofol due to seizures Improving urine output  Objective   Blood pressure (!) 143/81, pulse 89, temperature 98.2 F (36.8 C), temperature source Oral, resp. rate 16, height 6\' 3"  (1.905 m), weight 88.2 kg, SpO2 100 %.    Vent Mode: PRVC FiO2 (%):  [30 %-40 %] 30 % Set Rate:  [16 bmp] 16 bmp Vt Set:  [670 mL] 670 mL PEEP:  [5 cmH20] 5 cmH20 Plateau Pressure:  [14 cmH20-16 cmH20] 14 cmH20   Intake/Output Summary (Last 24 hours) at 08/01/2018 0933 Last data filed at 08/01/2018 0900 Gross per 24 hour  Intake 2636.03 ml  Output 675 ml  Net 1961.03 ml   Filed  Weights    07/30/18 0200 07/30/18 0400 08/01/18 0500  Weight: 83 kg 83 kg 88.2 kg   Examination: General: Acutely ill appearing male, sedated on propofol HENT: Mild pallor, no icterus Lungs: CTA bilaterally Cardiovascular: RRR, Nl S1/S2 and -M/R/G Abdomen: Soft, NT, ND and +BS Extremities: -edema and -tenderness Neuro: RA SS -5, on propofol and c EEG GU: Clear yellow   Chest x-ray 3/16 personally reviewed, improved aeration, no effusions Resolved Hospital Problem list    Assessment & Plan:   Acute metabolic encephalopathy.  likely multifactorial: sepsis, possible non-convulsive seizure 2/2 neurotoxicity from cefepime, metabolic acidosis, renal failure and worsening azotemia.   Now in induced coma Plan Propofol being tapered with LTM EEG monitoring per neurology RASS goal can be 0 once seizures have resolved    Acute hypoxic respiratory failure 2/2 evolving pulmonary infiltrates and atx super imposed on underlying severe PH and OSA  Plan Full vent support for now  Hypertension-resume home dose of hydralazine Clonidine patch can be dc'd once BP controlled  Acute on chronic renal failure (st III CKD) 2/2 chronic urinary retention w/ h/o poorly functioning bladder, enlarged prostate and mild to mod obstruction. Has had multiple recurrent UTIs. Has not completed treatment in past d/t financial restraints.  Hyperchloremia amd Non-anion gap metabolic acidosis Solid lesion on upper pole of right kidney.  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 CT abdomen imaging also noting innumerable lytic lesions involving the spine Plan Has baseline  leukocytosis 22k range so difficult to interpret Not been treated due to poor performance status, last oncology visit 06/10/2018 reviewed  Iron def anemia Plan Transfuse for hemoglobin 7 or lower    Best practice:  Diet: NPO Pain/Anxiety/Delirium protocol (if indicated): 3/13 VAP protocol (if indicated): 3/13 DVT prophylaxis: [place] heparin  GI prophylaxis: H2B Glucose control: ssi Mobilitity BR Code Status: full code  Family Communication: pending Disposition: Remains in ICU while intubated/seizures  Summary-propofol being tapered today and if seizures do not reemerge on EEG then goal would be to wean to extubation Pseudomonas did not clear with cefepime we will reculture to make sure that they have cleared with Zosyn  The patient is critically ill with multiple organ systems failure and requires high complexity decision making for assessment and support, frequent evaluation and titration of therapies, application of advanced monitoring technologies and extensive interpretation of multiple databases. Critical Care Time devoted to patient care services described in this note independent of APP/resident  time is 33 minutes.   Kara Mead MD. [facility]. [place] Pulmonary & Critical care Pager [phone] If no response call [phone]   08/01/2018

## 2018-08-01 NOTE — Progress Notes (Addendum)
Subjective: Sedated on propofol  Exam: Vitals:   08/01/18 0740 08/01/18 0750  BP:  133/74  Pulse:  86  Resp:  16  Temp: 98.2 F (36.8 C)   SpO2:  100%   Gen: In bed, intubated Resp: ventilated Abd: soft, nt  Neuro:(limited by sedation) MS: does not open eyes or follow commands YN:[id number], eyes midline Motor: does not respond to nox stim.  Sensory: as above  Pertinent Labs: Cr 3.04  CSF - not concerning for infection  EEG reviewed: no sz  Impression: 78 yo M with encephaloapthy with bifrontal discharges concerning for possible status epilepticus.  He did appear to have some response to Ativan, and there is also question on Friday that he may have some left arm jerking associated with discharges.  For this reason, this has been treated aggressively as status epilepticus.  He has been on propofol titrated to seizure suppression since yesterday.  I continue to think that cefepime may be playing a role in his current presentation given that it can cause an EEG very similar.  Could also cause nonconvulsive status epilepticus.  He has been changed to linezolid.  I think that at this point, I would favor continuing Depakote and Keppra, and weaning propofol today to see what his clinical exam is like.  Recommendations: 1) Keppra 500mg  BID(renally dosed) 2) Depakote 830mg  TID(check level) 3) wean propofol.   This patient is critically ill and at significant risk of neurological worsening, death and care requires constant monitoring of vital signs, hemodynamics,respiratory and cardiac monitoring, neurological assessment, discussion with family, other specialists and medical decision making of high complexity. I spent 40 minutes of neurocritical care time  in the care of  this patient. This was time spent independent of any time provided by nurse practitioner or PA.  Roland Rack, MD [facility] [phone]  If 7pm- 7am, please page neurology on call as listed in  [place]. 08/01/2018  8:21 AM

## 2018-08-01 NOTE — Progress Notes (Signed)
EEG LTM maint complete. No skin breakdown. Continue to monitor

## 2018-08-02 ENCOUNTER — Inpatient Hospital Stay: Payer: Self-pay

## 2018-08-02 ENCOUNTER — Inpatient Hospital Stay (HOSPITAL_COMMUNITY): Payer: Medicare Other

## 2018-08-02 LAB — GLUCOSE, CAPILLARY
GLUCOSE-CAPILLARY: 106 mg/dL — AB (ref 70–99)
Glucose-Capillary: 102 mg/dL — ABNORMAL HIGH (ref 70–99)
Glucose-Capillary: 104 mg/dL — ABNORMAL HIGH (ref 70–99)
Glucose-Capillary: 106 mg/dL — ABNORMAL HIGH (ref 70–99)
Glucose-Capillary: 90 mg/dL (ref 70–99)
Glucose-Capillary: 95 mg/dL (ref 70–99)

## 2018-08-02 LAB — BASIC METABOLIC PANEL
BUN: 81 mg/dL — ABNORMAL HIGH (ref 8–23)
CO2: 15 mmol/L — ABNORMAL LOW (ref 22–32)
CREATININE: 2.99 mg/dL — AB (ref 0.61–1.24)
Calcium: 9.3 mg/dL (ref 8.9–10.3)
Chloride: 121 mmol/L — ABNORMAL HIGH (ref 98–111)
Glucose, Bld: 102 mg/dL — ABNORMAL HIGH (ref 70–99)
Potassium: 3 mmol/L — ABNORMAL LOW (ref 3.5–5.1)
Sodium: 146 mmol/L — ABNORMAL HIGH (ref 135–145)

## 2018-08-02 LAB — CBC
HCT: 28.1 % — ABNORMAL LOW (ref 39.0–52.0)
Hemoglobin: 8.3 g/dL — ABNORMAL LOW (ref 13.0–17.0)
MCH: 24.6 pg — ABNORMAL LOW (ref 26.0–34.0)
MCHC: 29.5 g/dL — ABNORMAL LOW (ref 30.0–36.0)
MCV: 83.1 fL (ref 80.0–100.0)
PLATELETS: 184 10*3/uL (ref 150–400)
RBC: 3.38 MIL/uL — ABNORMAL LOW (ref 4.22–5.81)
RDW: 18.6 % — ABNORMAL HIGH (ref 11.5–15.5)
WBC: 42.7 10*3/uL — ABNORMAL HIGH (ref 4.0–10.5)
nRBC: 0.5 % — ABNORMAL HIGH (ref 0.0–0.2)

## 2018-08-02 LAB — PATHOLOGIST SMEAR REVIEW

## 2018-08-02 LAB — MAGNESIUM: Magnesium: 2.4 mg/dL (ref 1.7–2.4)

## 2018-08-02 LAB — PHOSPHORUS: Phosphorus: 3.9 mg/dL (ref 2.5–4.6)

## 2018-08-02 LAB — CULTURE, BLOOD (ROUTINE X 2)
Culture: NO GROWTH
Culture: NO GROWTH
Special Requests: ADEQUATE
Special Requests: ADEQUATE

## 2018-08-02 LAB — VALPROIC ACID LEVEL: Valproic Acid Lvl: 49 ug/mL — ABNORMAL LOW (ref 50.0–100.0)

## 2018-08-02 MED ORDER — DOPAMINE-DEXTROSE 3.2-5 MG/ML-% IV SOLN
INTRAVENOUS | Status: AC
  Filled 2018-08-02: qty 250

## 2018-08-02 MED ORDER — ATROPINE SULFATE 1 MG/ML IJ SOLN: 1.0000 mg | Freq: Once | INTRAMUSCULAR | Status: AC

## 2018-08-02 MED ORDER — MIDAZOLAM 50MG/50ML (1MG/ML) PREMIX INFUSION
20.0000 mg/h | INTRAVENOUS | Status: DC
  Administered 2018-08-02: 3 mg/h via INTRAVENOUS
  Administered 2018-08-02: 20 mg/h via INTRAVENOUS
  Administered 2018-08-02: 10 mg/h via INTRAVENOUS
  Administered 2018-08-02: 20 mg/h via INTRAVENOUS
  Filled 2018-08-02 (×5): qty 50

## 2018-08-02 MED ORDER — SODIUM CHLORIDE 0.9% FLUSH
10.0000 mL | INTRAVENOUS | Status: DC | PRN
  Administered 2018-08-08: 10 mL
  Filled 2018-08-02: qty 40

## 2018-08-02 MED ORDER — PRO-STAT SUGAR FREE PO LIQD
30.0000 mL | Freq: Two times a day (BID) | ORAL | Status: DC
  Administered 2018-08-02 – 2018-08-15 (×27): 30 mL
  Filled 2018-08-02 (×27): qty 30

## 2018-08-02 MED ORDER — CHLORHEXIDINE GLUCONATE CLOTH 2 % EX PADS
6.0000 | MEDICATED_PAD | Freq: Every day | CUTANEOUS | Status: DC
  Administered 2018-08-02 – 2018-08-20 (×18): 6 via TOPICAL

## 2018-08-02 MED ORDER — VALPROATE SODIUM 500 MG/5ML IV SOLN
1000.0000 mg | Freq: Three times a day (TID) | INTRAVENOUS | Status: DC
  Administered 2018-08-02 – 2018-08-03 (×3): 1000 mg via INTRAVENOUS
  Filled 2018-08-02 (×4): qty 10

## 2018-08-02 MED ORDER — POTASSIUM CHLORIDE 20 MEQ/15ML (10%) PO SOLN
40.0000 meq | Freq: Once | ORAL | Status: AC
  Administered 2018-08-02: 40 meq
  Filled 2018-08-02: qty 30

## 2018-08-02 MED ORDER — ATROPINE SULFATE 1 MG/10ML IJ SOSY
PREFILLED_SYRINGE | INTRAMUSCULAR | Status: AC
  Administered 2018-08-02: 01:00:00
  Filled 2018-08-02: qty 10

## 2018-08-02 MED ORDER — VITAL AF 1.2 CAL PO LIQD
1000.0000 mL | ORAL | Status: DC
  Administered 2018-08-02 – 2018-08-10 (×8): 1000 mL
  Filled 2018-08-02: qty 1000

## 2018-08-02 MED ORDER — FAMOTIDINE 40 MG/5ML PO SUSR
20.0000 mg | Freq: Every day | ORAL | Status: DC
  Administered 2018-08-03 – 2018-08-20 (×18): 20 mg
  Filled 2018-08-02 (×19): qty 2.5

## 2018-08-02 MED ORDER — DOPAMINE-DEXTROSE 3.2-5 MG/ML-% IV SOLN
0.0000 ug/kg/min | INTRAVENOUS | Status: DC
  Administered 2018-08-02: 5 ug/kg/min via INTRAVENOUS

## 2018-08-02 MED ORDER — SODIUM CHLORIDE 0.9% FLUSH
10.0000 mL | Freq: Two times a day (BID) | INTRAVENOUS | Status: DC
  Administered 2018-08-03 – 2018-08-09 (×11): 10 mL

## 2018-08-02 NOTE — Progress Notes (Signed)
Subjective: Weaned propofol yesterday, he had return of a bifrontally predominant periodic pattern, no definite ictal evolution.  He did not improve clinically.  Sedation was restarted, but he became bradycardic overnight and changed to Versed.  Exam: Vitals:   08/02/18 1150 08/02/18 1200  BP:  135/76  Pulse:  90  Resp:  16  Temp: 98.6 F (37 C)   SpO2:  100%   Gen: In bed, intubated Resp: ventilated Abd: soft, nt  Neuro:(limited by sedation) MS: does not open eyes or follow commands NG:ITJLL, eyes midline Motor: does not respond to nox stim.  Sensory: as above  Pertinent Labs: Depakote level 49  CSF - not concerning for infection  EEG reviewed: no sz  Impression: 78 yo M with encephaloapthy with bifrontal triphasic appearing discharges concerning for possible status epilepticus.  He did appear to have some response to Ativan, and there is also question on Friday that he may have some left arm jerking associated with discharges.  For this reason, this has been treated aggressively as status epilepticus.  I continue to think that cefepime may be playing a role in his current presentation given that it can cause an EEG very similar but it could also cause nonconvulsive status epilepticus.  He has been changed to linezolid.  I think that at this point, I would favor continuing antiepileptics and weaning sedation.  Recommendations: 1) Keppra 500mg  BID(renally dosed) 2) increase Depakote to 1 g 3 times daily 3) continue Vimpat 200 twice daily 4) wean Versed  This patient is critically ill and at significant risk of neurological worsening, death and care requires constant monitoring of vital signs, hemodynamics,respiratory and cardiac monitoring, neurological assessment, discussion with family, other specialists and medical decision making of high complexity. I spent 40 minutes of neurocritical care time  in the care of  this patient. This was time spent independent of any time  provided by nurse practitioner or PA.  Roland Rack, MD [facility] [phone]  If 7pm- 7am, please page neurology on call as listed in [place]. 08/02/2018  12:31 PM

## 2018-08-02 NOTE — Progress Notes (Signed)
eLink Physician-Brief Progress Note Patient Name: Juan [name] DOB: Sep 06, 1940 MRN: [id number]   Date of Service  08/02/2018  HPI/Events of Note  Bradycardia - Likely d/t Propofol IV infusion. Propofol IV infusion now held.   eICU Interventions  Will order: 1. Dopamine IV infusion. Titrate for MAP > 65 and HR > 60.  2. Atropine 1 amp IV now.  2. Further management of seizures per Neurology.      Intervention Category Major Interventions: Arrhythmia - evaluation and management  Nik Gorrell Eugene 08/02/2018, 1:03 AM

## 2018-08-02 NOTE — Progress Notes (Signed)
Peripherally Inserted Central Catheter/Midline Placement  The IV Nurse has discussed with the patient and/or persons authorized to consent for the patient, the purpose of this procedure and the potential benefits and risks involved with this procedure.  The benefits include less needle sticks, lab draws from the catheter, and the patient may be discharged home with the catheter. Risks include, but not limited to, infection, bleeding, blood clot (thrombus formation), and puncture of an artery; nerve damage and irregular heartbeat and possibility to perform a PICC exchange if needed/ordered by physician.  Alternatives to this procedure were also discussed.  Bard Power PICC patient education guide, fact sheet on infection prevention and patient information card has been provided to patient /or left at bedside.    PICC/Midline Placement Documentation  PICC Double Lumen 08/02/18 PICC Right Brachial 39 cm 0 cm (Active)  Indication for Insertion or Continuance of Line Prolonged intravenous therapies 08/02/2018  4:04 PM  Exposed Catheter (cm) 0 cm 08/02/2018  4:04 PM  Site Assessment Clean;Dry;Intact 08/02/2018  4:04 PM  Lumen #1 Status Flushed;Blood return noted 08/02/2018  4:04 PM  Lumen #2 Status Flushed;Blood return noted 08/02/2018  4:04 PM  Dressing Type Transparent 08/02/2018  4:04 PM  Dressing Status Clean;Dry;Intact;Antimicrobial disc in place 08/02/2018  4:04 PM  Dressing Intervention New dressing 08/02/2018  4:04 PM  Dressing Change Due 08/09/18 08/02/2018  4:04 PM    Telephone consent signed by Foye Clock Albarece 08/02/2018, 4:04 PM

## 2018-08-02 NOTE — Progress Notes (Signed)
Nutrition Follow-up / Consult  DOCUMENTATION CODES:   Not applicable  INTERVENTION:    Vital AF 1.2 at 60 ml/h (1440 ml per day)  Pro-stat 30 ml BID  Provides 1928 kcal, 138 gm protein, 1168 ml free water daily  NUTRITION DIAGNOSIS:   Inadequate oral intake related to inability to eat as evidenced by NPO status.  Ongoing   GOAL:   Patient will meet greater than or equal to 90% of their needs  Progressing  MONITOR:   Vent status, Labs, Skin, I & O's  REASON FOR ASSESSMENT:   Consult Enteral/tube feeding initiation and management  ASSESSMENT:   78 yo male with PMH of HTN, CAD, DM-2, myeloproliferative disease, CHF, iron deficiency, CKD who was admitted on 3/8 with complicated UTI. Transferred to the ICU and intubated on 3/13 for acute encephalopathy and worsening mental status.  Bradycardia episode and mucus plugging last night. Propofol changed to versed today.   Received MD Consult for TF initiation and management. OGT in place. Currently receiving Pivot 1.5 at 20 ml/h, tolerating well.  Patient remains intubated on ventilator support MV: 10.3 L/min Temp (24hrs), Avg:97.5 F (36.4 C), Min:95.1 F (35.1 C), Max:98.6 F (37 C)   Labs reviewed. Sodium 146 (H), potassium 3 (L), BUN 81 (H), creatinine 2.99 (H) Medications reviewed and include lasix. Potassium being replaced.    Diet Order:   Diet Order            Diet NPO time specified  Diet effective now              EDUCATION NEEDS:   No education needs have been identified at this time  Skin:  Skin Assessment: Reviewed RN Assessment  Last BM:  3/17 (type 6)  Height:   Ht Readings from Last 1 Encounters:  07/29/18 6\' 3"  (1.905 m)    Weight:   Wt Readings from Last 1 Encounters:  08/02/18 88.5 kg    Ideal Body Weight:  89.1 kg  BMI:  Body mass index is 24.39 kg/m.  Estimated Nutritional Needs:   Kcal:  1925  Protein:  120-140 gm  Fluid:  2 L    Molli Barrows, RD,  LDN, [place] Pager [phone] After Hours Pager [phone]

## 2018-08-02 NOTE — Plan of Care (Signed)
  Problem: Health Behavior/Discharge Planning: Goal: Ability to manage health-related needs will improve Outcome: Progressing   Problem: Clinical Measurements: Goal: Ability to maintain clinical measurements within normal limits will improve Outcome: Progressing Goal: Will remain free from infection Outcome: Progressing Goal: Diagnostic test results will improve Outcome: Progressing Goal: Respiratory complications will improve Outcome: Progressing Goal: Cardiovascular complication will be avoided Outcome: Progressing   Problem: Activity: Goal: Risk for activity intolerance will decrease Outcome: Progressing   Problem: Coping: Goal: Level of anxiety will decrease Outcome: Progressing   Problem: Elimination: Goal: Will not experience complications related to bowel motility Outcome: Progressing Goal: Will not experience complications related to urinary retention Outcome: Progressing   Problem: Pain Managment: Goal: General experience of comfort will improve Outcome: Progressing   Problem: Safety: Goal: Ability to remain free from injury will improve Outcome: Progressing   Problem: Skin Integrity: Goal: Risk for impaired skin integrity will decrease Outcome: Progressing   Problem: Urinary Elimination: Goal: Signs and symptoms of infection will decrease Outcome: Progressing

## 2018-08-02 NOTE — Progress Notes (Signed)
eLink Physician-Brief Progress Note Patient Name: Juan [name] DOB: [birth year] MRN: [id number]   Date of Service  08/02/2018  HPI/Events of Note  Hypothermia - Temp = 95.1 F.  eICU Interventions  Will order: 1. Bair Hugger PRN.      Intervention Category Major Interventions: Arrhythmia - evaluation and management;Other:  Aleksis Jiggetts Cornelia Copa 08/02/2018, 1:09 AM

## 2018-08-02 NOTE — Procedures (Signed)
  CPT/Type of Study: [id number]; 12-26hr continuous EEG with video Recording dates: 08/01/18 @07 :30 to 08/02/18 @07 :30 Recording epoch: 4 Requesting provider: Leonel Ramsay Interpreting physician: Becky Sax, MD  Indications for Procedure: Myoclonus Primary neurological diagnosis: Toxic metabolic disturbance: Sepsis  History: This is a 78 year old patient, undergoing continuous EEG to evaluate for seizures.   EEG Details: Continuous video EEG was performed using standard setting per the guidelines of American Clinical Neurophysiology Society (ACNS). A minimum of 21 electrodes were placed on scalp according to the International 10-20 or 10-10 system. Supplemental electrodes were placed as needed. Single EKG electrode was also used to detect cardiac arrhythmia. Recording was performed at a sampling rate of at least 256 Hz. Patient's behavior was continuously recorded on video simultaneously with EEG. A minimum of 18 channels were used for data display. Each epoch of study was reviewed manually daily and as needed using standard digital review software allowing for montage reformatting, gain and filter changes on a display system of sufficient resolution to prevent aliasing. Computerized quantitative EEG analysis (such as compressed spectral array analysis, dipole analysis, trending, automated spike & seizure detection) was used as indicated.  Description of EEG features: State of patient: Stupor <--> Awake  Background Activity Overall Amplitude: medium amplitude (20-50 V), symmetric <--> suppressed (<10 V) Predominant Frequency: A posterior dominant rhythm of 7 Hz is observed, which is intermittent with arousals and stimulation.  Superimposed Frequencies: continuous medium amplitude (20-50 V) polymorphic delta>theta rhythms, bilateral and symmetric Reactivity to stimulation: present Asymmetry: No  Breach rhythm: No  Sleep: No normal sleep architecture is seen.  Background abnormalities:  Yes 1. Continuous slow, generalized  2. Burst suppression, generalized From ~20:30 to 01:30 the background changes abruptly to burst suppression reaching ~80%, then returns to diffuse slowing and interittent arousals.  Periodic or rhythmic abnormalities: Yes 1. Triphasic GPDs This pattern is frequent (~30% of the recording), long (5-59 minutes) in duration and seen with either stimulation or arousals. It has a frequency of about 1.5 Hz and has waveforms that are triphasic, with sharply contoured morphology and medium-high amplitude (50-100 V). There are no periods of discrete evolution.  Epileptiform discharges: No Paroxysmal events or seizures: No Push button events: No  EKG: 90 bpm and irregularly irregular  Impression This continuous EEG is indicative of: 1. A variable diffuse encephalopathy, ranging from moderate to severe with a period of burst suppression. A contribution from sedating medication effect cannot be excluded.  2. During arousals and with stimulation, there is a non-evolving, slow periodic pattern of triphasic waves. This finding can be seen in patients with toxic-metabolic, sepsis-associated and postanoxic encephalopathy.   No epileptiform discharges, no seizures, and no focal or lateralizing signs are seen.  Recommendations Continue VEEG to ensure the absence of subclinical seizures and guide management.

## 2018-08-02 NOTE — Progress Notes (Signed)
Attempted to call the son but no answer and voice mail is not being set up.

## 2018-08-02 NOTE — Progress Notes (Signed)
[facility] Progress Note Patient Name: Juan [name] DOB: 1940-06-28 MRN: [id number]   Date of Service  08/02/2018  HPI/Events of Note  K+ = 3.0 and Creatinine = 2.99. Patient being diuresed with Lasix.   eICU Interventions  Will cautiously replace K+.      Intervention Category Major Interventions: Electrolyte abnormality - evaluation and management  Kierra Jezewski Eugene 08/02/2018, 4:43 AM

## 2018-08-02 NOTE — Progress Notes (Signed)
NAME:  Juan [name], MRN:  [id number], DOB:  [birth year], LOS: 9 ADMISSION DATE:  07/27/2018, CONSULTATION DATE:  3/13 REFERRING MD:  Lupita Leash, CHIEF COMPLAINT:  Acute encephalopathy and worsening mental status    Brief History   78 year old male patient admitted initially on 3/7 with sepsis secondary secondary to pseudomonas urinary tract infection with associated acute on chronic renal failure secondary to obstructive uropathy.  Over the course of his hospitalization developed progressive encephalopathy, with concern for nonconvulsive status on 3/12.  This was felt possibly secondary to neurotoxicity from cefepime.  In spite of treating potential seizure stopping antibiotics and continuing supportive care as of 3/13 he remained obtunded and developed worsening hypoxia and work of breathing therefore pulmonary asked to evaluate    Past Medical History  Chronic urinary retention with prostate hypertrophy, prior Pseudomonas urinary tract infections, stage III chronic kidney disease, moderate to severe pulmonary hypertension, chronic diastolic heart failure, myelodysplasia with myelodysplastic syndrome, chronic back pain  Significant Hospital Events   3/7: Admitted with presumptive diagnosis of urinary tract infection 3/9: worsening renal failure, seen by urology, Foley catheter placed 3/12: Worsening mental status, EEG worrisome for possible nonconvulsive status.  Felt possibly due to neurotoxicity from cefepime this was discontinued and Zosyn was started.  lumbar puncture performed, for possible meningitis.  Infectious disease consulted, neurology consulted, started on Keppra, moved to [facility] for continuous EEG monitoring 3/13 remains unresponsive, developed worsening hypoxia staph from prior LP unremarkable without evidence of meningitis.  Pulmonary asked to evaluate  Consults:   ID Neurology Urology PCCM  Procedures:   3/12 Fluoro-guided LP performed at L3-L4. Opening pressure 12 cm H20. 13.5 ml  clear CSF obtained Significant Diagnostic Tests:  CT abdomen pelvis 3/7 showed bilateral hydronephrosis with bladder wall thickening and multiple bladder diverticuli suggesting chronic bladder outlet obstruction with cystitis MRI brain 3/12: Truncated and motion degraded examination without visible acute abnormality.  MRI L spine 3/12:1. Mild foraminal narrowing bilaterally at L4-5 is worse on the left.2. Chronic endplate changes at C3-7 with associated Schmorl's node. 3. Minimal rightward disc bulging without significant stenosis.4. 10 mm solid lesion at the upper pole of the right kidney was not well seen on recent CT scans. Small renal cell carcinoma is not excluded. Recommend dedicated MRI of the kidneys as the patient's condition allows. 5. Stable cyst at the lower pole of the right kidney.  EEG 3/12 frequent periodic discharges concerning for triphasic versus epileptiform GPD's 3/17 EEG >>burst suppression, triphasics GPD's US abdomen 3/12: 1. No acute findings. 2. Single visualized gallstone. No evidence of acute cholecystitis. No other abnormality.  Micro Data:  UC 3/7 pseudomonas (cipro resistant) Urine cx 3/12 >> pseudomonas BCx2 3/8>>> neg BCX2 3/12>>>neg CSF 3/12: neg resp culture 3/13>>>nml flora 3/16 urine >> pseudomonas >>  Antimicrobials:  Cefepime 3/7>>3/12 Zosyn 3/12>>> zyvox 3/13>>> Interim history/subjective:   Episode of bradycardia overnight in spite of stopping propofol hence dopamine started. Episode of mucus plugging Started back on Versed per neurology  Objective   Blood pressure 140/73, pulse 86, temperature 98 F (36.7 C), temperature source Axillary, resp. rate 16, height 6\' 3"  (1.905 m), weight 88.5 kg, SpO2 100 %.    Vent Mode: PRVC FiO2 (%):  [30 %] 30 % Set Rate:  [16 bmp] 16 bmp Vt Set:  [670 mL] 670 mL PEEP:  [5 cmH20-8 cmH20] 8 cmH20 Plateau Pressure:  [13 cmH20-20 cmH20] 20 cmH20   Intake/Output Summary (Last 24 hours) at  08/02/2018 6283 Last data filed at 08/02/2018  0853 Gross per 24 hour  Intake 1772.19 ml  Output 1195 ml  Net 577.19 ml   Filed Weights   07/30/18 0400 08/01/18 0500 08/02/18 0500  Weight: 83 kg 88.2 kg 88.5 kg   Examination: Acutely ill, elderly man, sedated on Versed drip Orally intubated Mild pallor, no icterus, no JVD 2+ anasarca Decreased breath sounds bilateral, no rhonchi S1-S2 normal Soft nontender abdomen RA SS -5, induced coma  Chest x-ray personally reviewed, ET tube in position, no new infiltrates or effusions  Resolved Hospital Problem list    Assessment & Plan:   Acute metabolic encephalopathy.  likely multifactorial: sepsis, possible non-convulsive seizure 2/2 neurotoxicity from cefepime, metabolic acidosis, renal failure and worsening azotemia.   Now in induced coma, propofol changed to Versed 3/17 Plan On Versed drip now per neurology, if LTM EEG does not show seizures can taper Versed to off    Acute hypoxic respiratory failure 2/2 evolving pulmonary infiltrates and atx super imposed on underlying severe PH and OSA  percent of mucous plugging 3/16 Plan Full vent support for now Hypertonic saline nebs, send respiratory culture  Hypertension-resume home dose of hydralazine Dc Clonidine patch   Transient bradycardia-DC dopamine  Acute on chronic renal failure (st III CKD) 2/2 chronic urinary retention w/ h/o poorly functioning bladder, enlarged prostate and mild to mod obstruction. Has had multiple recurrent UTIs. Has not completed treatment in past d/t financial restraints.  Hyperchloremia amd Non-anion gap metabolic acidosis Solid lesion on upper pole of right kidney.  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 - Not been treated due to poor performance status, last oncology visit 06/10/2018 CT abdomen imaging also noting innumerable lytic lesions involving the spine Leukocytosis Plan Has baseline leukocytosis 22k range so difficult to interpret   Iron def anemia Plan Transfuse for hemoglobin 7 or lower    Best practice:  Diet: NPO Pain/Anxiety/Delirium protocol (if indicated): 3/13 VAP protocol (if indicated): 3/13 DVT prophylaxis: [place] heparin  GI prophylaxis: H2B Glucose control: ssi Mobilitity BR Code Status: full code  Family Communication: pending Disposition: Remains in ICU while intubated/seizures  Summary-prognosis guarded, difficult situation high as triphasic pattern but difficult to discern whether this is actual seizure or not, did have clinical seizures on 3/13  The patient is critically ill with multiple organ systems failure and requires high complexity decision making for assessment and support, frequent evaluation and titration of therapies, application of advanced monitoring technologies and extensive interpretation of multiple databases. Critical Care Time devoted to patient care services described in this note independent of APP/resident  time is 35 minutes.   Kara Mead MD. Shade Flood. [facility] Pager [phone] If no response call [phone]   08/02/2018

## 2018-08-02 NOTE — Progress Notes (Signed)
LTM EEG checked, no skin breakdown noted. Paste added to T6 and P3

## 2018-08-03 ENCOUNTER — Ambulatory Visit: Payer: Self-pay | Admitting: Internal Medicine

## 2018-08-03 ENCOUNTER — Inpatient Hospital Stay (HOSPITAL_COMMUNITY): Payer: Medicare Other

## 2018-08-03 DIAGNOSIS — G40901 Epilepsy, unspecified, not intractable, with status epilepticus: Secondary | ICD-10-CM

## 2018-08-03 LAB — URINE CULTURE: Culture: >=100000 — AB

## 2018-08-03 LAB — CBC
HCT: 25.7 % — ABNORMAL LOW (ref 39.0–52.0)
Hemoglobin: 7.5 g/dL — ABNORMAL LOW (ref 13.0–17.0)
MCH: 25 pg — ABNORMAL LOW (ref 26.0–34.0)
MCHC: 29.2 g/dL — ABNORMAL LOW (ref 30.0–36.0)
MCV: 85.7 fL (ref 80.0–100.0)
Platelets: 178 10*3/uL (ref 150–400)
RBC: 3 MIL/uL — AB (ref 4.22–5.81)
RDW: 18.7 % — ABNORMAL HIGH (ref 11.5–15.5)
WBC: 38.6 10*3/uL — ABNORMAL HIGH (ref 4.0–10.5)
nRBC: 1 % — ABNORMAL HIGH (ref 0.0–0.2)

## 2018-08-03 LAB — GLUCOSE, CAPILLARY
Glucose-Capillary: 65 mg/dL — ABNORMAL LOW (ref 70–99)
Glucose-Capillary: 65 mg/dL — ABNORMAL LOW (ref 70–99)
Glucose-Capillary: 78 mg/dL (ref 70–99)
Glucose-Capillary: 80 mg/dL (ref 70–99)
Glucose-Capillary: 80 mg/dL (ref 70–99)
Glucose-Capillary: 82 mg/dL (ref 70–99)
Glucose-Capillary: 93 mg/dL (ref 70–99)

## 2018-08-03 LAB — VALPROIC ACID LEVEL: Valproic Acid Lvl: 43 ug/mL — ABNORMAL LOW (ref 50.0–100.0)

## 2018-08-03 LAB — BASIC METABOLIC PANEL
Anion gap: 5 (ref 5–15)
BUN: 93 mg/dL — ABNORMAL HIGH (ref 8–23)
CO2: 16 mmol/L — ABNORMAL LOW (ref 22–32)
Calcium: 8.8 mg/dL — ABNORMAL LOW (ref 8.9–10.3)
Chloride: 127 mmol/L — ABNORMAL HIGH (ref 98–111)
Creatinine, Ser: 3.19 mg/dL — ABNORMAL HIGH (ref 0.61–1.24)
GFR calc Af Amer: 21 mL/min — ABNORMAL LOW (ref >60)
GFR calc non Af Amer: 18 mL/min — ABNORMAL LOW (ref >60)
Glucose, Bld: 116 mg/dL — ABNORMAL HIGH (ref 70–99)
POTASSIUM: 3.5 mmol/L (ref 3.5–5.1)
Sodium: 148 mmol/L — ABNORMAL HIGH (ref 135–145)

## 2018-08-03 LAB — PHOSPHORUS: Phosphorus: 3.7 mg/dL (ref 2.5–4.6)

## 2018-08-03 LAB — MAGNESIUM: Magnesium: 2.3 mg/dL (ref 1.7–2.4)

## 2018-08-03 MED ORDER — DEXTROSE 5 % IV SOLN
INTRAVENOUS | Status: DC
  Administered 2018-08-03: 12:00:00 via INTRAVENOUS

## 2018-08-03 MED ORDER — VALPROATE SODIUM 500 MG/5ML IV SOLN: 750.0000 mg | Freq: Four times a day (QID) | INTRAVENOUS | Status: DC

## 2018-08-03 MED ORDER — VALPROATE SODIUM 500 MG/5ML IV SOLN: 500.0000 mg | Freq: Once | INTRAVENOUS | Status: DC

## 2018-08-03 MED ORDER — FREE WATER
200.0000 mL | Status: DC
  Administered 2018-08-03 – 2018-08-08 (×31): 200 mL

## 2018-08-03 MED ORDER — ACETYLCYSTEINE 20 % IN SOLN
2.0000 mL | Freq: Two times a day (BID) | RESPIRATORY_TRACT | Status: AC
  Administered 2018-08-03 – 2018-08-04 (×3): 2 mL via RESPIRATORY_TRACT
  Administered 2018-08-04: 20:00:00 via RESPIRATORY_TRACT
  Filled 2018-08-03 (×5): qty 4

## 2018-08-03 MED ORDER — ALBUTEROL SULFATE (2.5 MG/3ML) 0.083% IN NEBU
2.5000 mg | INHALATION_SOLUTION | Freq: Four times a day (QID) | RESPIRATORY_TRACT | Status: DC | PRN
  Administered 2018-08-03 – 2018-08-10 (×6): 2.5 mg via RESPIRATORY_TRACT
  Filled 2018-08-03 (×6): qty 3

## 2018-08-03 MED ORDER — SODIUM CHLORIDE 3 % IN NEBU
4.0000 mL | INHALATION_SOLUTION | Freq: Two times a day (BID) | RESPIRATORY_TRACT | Status: AC
  Administered 2018-08-03 – 2018-08-05 (×6): 4 mL via RESPIRATORY_TRACT
  Filled 2018-08-03 (×7): qty 4

## 2018-08-03 MED ORDER — ACETYLCYSTEINE 10 % IN SOLN
4.0000 mL | Freq: Two times a day (BID) | RESPIRATORY_TRACT | Status: DC
  Filled 2018-08-03 (×3): qty 4

## 2018-08-03 MED ORDER — VALPROATE SODIUM 500 MG/5ML IV SOLN
1000.0000 mg | Freq: Four times a day (QID) | INTRAVENOUS | Status: DC
  Administered 2018-08-03 – 2018-08-04 (×3): 1000 mg via INTRAVENOUS
  Filled 2018-08-03 (×4): qty 10

## 2018-08-03 NOTE — Procedures (Signed)
  CPT/Type of Study: [id number]; 12-26hr continuous EEG with video Recording dates: 08/02/18 @07 :30 to 08/03/18 @07 :30 Recording epoch: 5 Requesting provider: Leonel Ramsay Interpreting physician: Becky Sax, MD  Indications for Procedure: Myoclonus Primary neurological diagnosis: Toxic metabolic disturbance: Sepsis  History: This is a 78 year old patient, undergoing continuous EEG to evaluate for seizures.   EEG Details: Continuous video EEG was performed using standard setting per the guidelines of American Clinical Neurophysiology Society (ACNS). A minimum of 21 electrodes were placed on scalp according to the International 10-20 or 10-10 system. Supplemental electrodes were placed as needed. Single EKG electrode was also used to detect cardiac arrhythmia. Recording was performed at a sampling rate of at least 256 Hz. Patient's behavior was continuously recorded on video simultaneously with EEG. A minimum of 18 channels were used for data display. Each epoch of study was reviewed manually daily and as needed using standard digital review software allowing for montage reformatting, gain and filter changes on a display system of sufficient resolution to prevent aliasing. Computerized quantitative EEG analysis (such as compressed spectral array analysis, dipole analysis, trending, automated spike & seizure detection) was used as indicated.  Description of EEG features: State of patient: Coma  Background Activity Overall Amplitude: medium amplitude (20-50 V), symmetric Predominant Frequency: There is no posterior dominant rhythm. Diffuse  waxing and waning ~10 Hz activity is seen, maximum bi-frontal Superimposed Frequencies: continuous medium amplitude (20-50 V) polymorphic delta rhythms, bilateral and symmetric Reactivity to stimulation: present Asymmetry: No  Breach rhythm: No  Sleep: No normal sleep architecture is seen.  Background abnormalities: Yes 1. Continuous slow,  generalized  Periodic or rhythmic abnormalities: No Epileptiform discharges: No Paroxysmal events or seizures: No Push button events: No  EKG: 90 bpm and irregularly irregular  Impression This continuous EEG is indicative of: 1. A severe diffuse encephalopathy, but non-specific as to etiology. A contribution from sedating medication effect cannot be excluded.  No epileptiform discharges, no seizures, and no focal or lateralizing signs are seen.  Recommendations Continue VEEG to ensure the absence of subclinical seizures and guide management.

## 2018-08-03 NOTE — Progress Notes (Signed)
LTM checked, Fp1 and Fp2 moved slightly to prevent skin breakdown.

## 2018-08-03 NOTE — Progress Notes (Signed)
Subjective: Versed off since yesterday  Exam: Vitals:   08/03/18 0800 08/03/18 0900  BP: (!) 144/70 130/63  Pulse: 95 94  Resp: 16 18  Temp:    SpO2: 100% 100%   Gen: In bed, intubated Resp: ventilated Abd: soft, nt  Neuro:(limited by sedation) MS: does not open eyes or follow commands CR:[id number], eyes midline Motor: does not respond to nox stim.  Sensory: as above  Pertinent Labs: Depakote level 49 yesterday, dose increased, level today pending.  Cr 3.19 Ammonia 3/11 18 MRI brain 3/12 limited to DWI, T2, flair, but no clear abnormality.  CSF - not concerning for infection  EEG reviewed: no sz, still with stimulation, has some discharges, more supportive of triphasic etiology.     Impression: 78 yo M admitted initially with Pseudomonas UTI who developed  encephaloapthy with bifrontal triphasic appearing discharges concerning for possible status epilepticus.  He did appear to have some response to Ativan, and there is also question on Friday that he may have some left arm jerking associated with discharges.  For this reason, this has been treated aggressively as status epilepticus.  I continue to think that cefepime may be playing a role in his current presentation given that it can cause an EEG very similar but it could also cause nonconvulsive status epilepticus.  He has been changed to linezolid.  I think that at this point, I would favor continuing antiepileptics and holding sedation.  With his renal dysfunction, it is likely that he will take quite a while to clear the Versed.  Recommendations: 1) Keppra 500mg  BID(renally dosed) 2) increase Depakote to 1 g 3 times daily 3) continue Vimpat 200 twice daily   This patient is critically ill and at significant risk of neurological worsening, death and care requires constant monitoring of vital signs, hemodynamics,respiratory and cardiac monitoring, neurological assessment, discussion with family, other specialists and  medical decision making of high complexity. I spent 40 minutes of neurocritical care time  in the care of  this patient. This was time spent independent of any time provided by nurse practitioner or PA.  Roland Rack, MD [facility] [phone]  If 7pm- 7am, please page neurology on call as listed in [place]. 08/03/2018  10:13 AM

## 2018-08-03 NOTE — Progress Notes (Addendum)
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 He was initially under propofol, and transition to Versed due to severe bradycardia on 3/16, Versed was stopped 3/17.  He remains comatose, critically ill, intubated, being monitored by LTM EEG, pupils 3 mm reactive to light bilateral, decreased breath sounds bilateral, anasarca, soft nontender abdomen, S1-S2 regular.  Labs showing increasing hyponatremia to 148, creatinine increased to 3.2 with Lasix, WBC count stable in the 38 range, baseline 20 range.  Chest x-ray personally reviewed which does not show any new infiltrates or effusions.  Impression/plan Acute metabolic encephalopathy/nonconvulsive status epilepticus -expect that Versed is going to linger in the system for quite some time especially with his renal failure. He is now off Versed and under continuous EEG monitoring. Vimpat, Keppra and Depakote to continue  Acute respiratory failure-cannot be weaned currently Due to episode of mucous plugging 3/17, add Mucomyst and hypertonic saline nebs repeat respiratory culture  AKI/CKD -hold off Lasix For hypernatremia add free water 200 every 4 and D5W x1 L  Recurrent Pseudomonas UTI-unfortunately he has not cleared this bug in spite of 9 days of antibiotics, this is likely due to his under lying immunosuppression and obstructive uropathy We will complete 10 days and then stop  Myelodysplasia/myelofibrosis-poor prognosis, unable to interpret WBC count in the setting  Guarded prognosis for full neurological recovery We will initiate goals of care discussion with his family  The patient is critically ill with multiple organ systems failure and requires high complexity decision making for assessment and support, frequent evaluation and titration of therapies, application of  advanced monitoring technologies and extensive interpretation of multiple databases. Critical Care Time devoted to patient care services described in this note independent of APP/resident  time is 32 minutes.   Leanna Sato Elsworth Soho MD

## 2018-08-03 NOTE — Progress Notes (Signed)
NAME:  Juan [name], MRN:  [id number], DOB:  12-17-1940, LOS: 1 ADMISSION DATE:  08/06/2018, CONSULTATION DATE:  3/13 REFERRING MD:  Lupita Leash, CHIEF COMPLAINT:  Acute encephalopathy and worsening mental status    Brief History   78 year old male patient admitted initially on 3/7 with sepsis secondary secondary to pseudomonas urinary tract infection with associated acute on chronic renal failure secondary to obstructive uropathy.  Over the course of his hospitalization developed progressive encephalopathy, with concern for nonconvulsive status on 3/12.  This was felt possibly secondary to neurotoxicity from cefepime.  In spite of treating potential seizure stopping antibiotics and continuing supportive care as of 3/13 he remained obtunded and developed worsening hypoxia and work of breathing therefore pulmonary asked to evaluate    Past Medical History  Chronic urinary retention with prostate hypertrophy, prior Pseudomonas urinary tract infections, stage III chronic kidney disease, moderate to severe pulmonary hypertension, chronic diastolic heart failure, myelodysplasia with myelodysplastic syndrome, chronic back pain  Significant Hospital Events   3/7: Admitted with presumptive diagnosis of urinary tract infection 3/9: worsening renal failure, seen by urology, Foley catheter placed 3/12: Worsening mental status, EEG worrisome for possible nonconvulsive status.  Felt possibly due to neurotoxicity from cefepime this was discontinued and Zosyn was started.  lumbar puncture performed, for possible meningitis.  Infectious disease consulted, neurology consulted, started on Keppra, moved to [facility] for continuous EEG monitoring 3/13 remains unresponsive, developed worsening hypoxia staph from prior LP unremarkable without evidence of meningitis.  Pulmonary asked to evaluate  Consults:   ID Neurology Urology PCCM  Procedures:   3/12 Fluoro-guided LP performed at L3-L4. Opening pressure 12 cm H20. 13.5 ml  clear CSF obtained Significant Diagnostic Tests:  CT abdomen pelvis 3/7 showed bilateral hydronephrosis with bladder wall thickening and multiple bladder diverticuli suggesting chronic bladder outlet obstruction with cystitis MRI brain 3/12: Truncated and motion degraded examination without visible acute abnormality.  MRI L spine 3/12:1. Mild foraminal narrowing bilaterally at L4-5 is worse on the left.2. Chronic endplate changes at I9-5 with associated Schmorl's node. 3. Minimal rightward disc bulging without significant stenosis.4. 10 mm solid lesion at the upper pole of the right kidney was not well seen on recent CT scans. Small renal cell carcinoma is not excluded. Recommend dedicated MRI of the kidneys as the patient's condition allows. 5. Stable cyst at the lower pole of the right kidney.  EEG 3/12 frequent periodic discharges concerning for triphasic versus epileptiform GPD's 3/17 EEG >>burst suppression, triphasics GPD's US abdomen 3/12: 1. No acute findings. 2. Single visualized gallstone. No evidence of acute cholecystitis. No other abnormality.  Micro Data:  UC 3/7 pseudomonas (cipro resistant) Urine cx 3/12 >> pseudomonas BCx2 3/8>>> neg BCX2 3/12>>>neg CSF 3/12: neg resp culture 3/13>>>nml flora 3/16 urine >> pseudomonas >> sensitive to Zosyn  Antimicrobials:  Cefepime 3/7>>3/12 Zosyn 3/12>>> zyvox 3/13>>> off Interim history/subjective:   Bradycardia resolved resolved Off sedation Objective   Blood pressure (!) 144/70, pulse 95, temperature 99.5 F (37.5 C), temperature source Oral, resp. rate 16, height 6\' 3"  (1.905 m), weight 92.7 kg, SpO2 100 %.    Vent Mode: PRVC FiO2 (%):  [30 %] 30 % Set Rate:  [16 bmp] 16 bmp Vt Set:  [670 mL] 670 mL PEEP:  [8 cmH20] 8 cmH20 Plateau Pressure:  [17 cmH20-19 cmH20] 17 cmH20   Intake/Output Summary (Last 24 hours) at 08/03/2018 0858 Last data filed at 08/03/2018 0800 Gross per 24 hour  Intake 1302.8 ml  Output 600  ml  Net 702.8 ml   Filed Weights   08/01/18 0500 08/02/18 0500 08/03/18 0500  Weight: 88.2 kg 88.5 kg 92.7 kg   Examination:  General: Comatose male HEENT: Endotracheal tube in place Neuro: Does not respond to verbal commands.  Unresponsive to noxious stimuli CV: s1s2 rrr, no m/r/g PULM: even/non-labored, lungs bilaterally decreased breath sounds in the bases TX:MIWO, non-tender, bsx4 active  Extremities: warm/dry, 3+ edema  Skin: no rashes or lesions  Chest x-ray personally reviewed, ET tube in position, no new infiltrates or effusions  Resolved Hospital Problem list    Assessment & Plan:   Acute metabolic encephalopathy.  likely multifactorial: sepsis, possible non-convulsive seizure 2/2 neurotoxicity from cefepime, metabolic acidosis, renal failure and worsening azotemia.   Now in induced coma, propofol changed to Versed 3/17 Plan Versed per neurology currently off drip PRN Ativan    Acute hypoxic respiratory failure 2/2 evolving pulmonary infiltrates and atx super imposed on underlying severe PH and OSA  percent of mucous plugging 3/16 Plan Hours of full mechanical ventilatory support Monitor culture data  Hypertension-currently well controlled Continue Apresoline    Acute on chronic renal failure (st III CKD) 2/2 chronic urinary retention w/ h/o poorly functioning bladder, enlarged prostate and mild to mod obstruction. Has had multiple recurrent UTIs. Has not completed treatment in past d/t financial restraints.  Hyperchloremia amd Non-anion gap metabolic acidosis Solid lesion on upper pole of right kidney.  Hypokalemia Lab Results  Component Value Date   CREATININE 3.19 (H) 08/03/2018   CREATININE 2.99 (H) 08/02/2018   CREATININE 3.04 (H) 08/01/2018   CREATININE 1.41 (H) 06/10/2018   CREATININE 1.48 (H) 04/08/2018   CREATININE 1.50 (H) 03/02/2018   CREATININE 1.3 (H) 03/30/2017   CREATININE 1.3 (H) 07/30/2014   CREATININE 1.2 11/16/2013    Plan Avoid  nephrotoxins Monitor potassium Renal dose medication Currently getting Lasix on a daily basis.  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  -No interventions but is been followed by oncology last visit 06/10/2018  CT abdomen imaging also noting innumerable lytic lesions involving the spine Leukocytosis Plan Baseline since leukocytosis is 22,000 currently 38.6   Iron def anemia Recent Labs    08/02/18 0201 08/03/18 0329  HGB 8.3* 7.5*    Plan Transfuse per protocol    Best practice:  Diet: NPO Pain/Anxiety/Delirium protocol (if indicated): 3/13 VAP protocol (if indicated): 3/13 DVT prophylaxis: [place] heparin  GI prophylaxis: H2B Glucose control: ssi Mobilitity BR Code Status: full code  Family Communication: 08/03/2018 family at bedside patient unable to Disposition: Remains in ICU while intubated/seizures  App CCT 30 min   Richardson Landry Ladena Jacquez ACNP Maryanna Shape PCCM Pager [phone] till 1 pm If no answer page [phone] 08/03/2018, 8:58 AM

## 2018-08-04 ENCOUNTER — Inpatient Hospital Stay (HOSPITAL_COMMUNITY): Payer: Medicare Other

## 2018-08-04 LAB — COMPREHENSIVE METABOLIC PANEL
ALBUMIN: 1.6 g/dL — AB (ref 3.5–5.0)
ALT: 10 U/L (ref 0–44)
AST: 15 U/L (ref 15–41)
Alkaline Phosphatase: 86 U/L (ref 38–126)
Anion gap: 11 (ref 5–15)
BUN: 98 mg/dL — AB (ref 8–23)
CHLORIDE: 116 mmol/L — AB (ref 98–111)
CO2: 16 mmol/L — ABNORMAL LOW (ref 22–32)
Calcium: 8.9 mg/dL (ref 8.9–10.3)
Creatinine, Ser: 2.99 mg/dL — ABNORMAL HIGH (ref 0.61–1.24)
GFR calc Af Amer: 22 mL/min — ABNORMAL LOW (ref >60)
GFR calc non Af Amer: 19 mL/min — ABNORMAL LOW (ref >60)
Glucose, Bld: 268 mg/dL — ABNORMAL HIGH (ref 70–99)
Potassium: 2.8 mmol/L — ABNORMAL LOW (ref 3.5–5.1)
Sodium: 143 mmol/L (ref 135–145)
Total Bilirubin: 0.4 mg/dL (ref 0.3–1.2)
Total Protein: 5.6 g/dL — ABNORMAL LOW (ref 6.5–8.1)

## 2018-08-04 LAB — GLUCOSE, CAPILLARY
Glucose-Capillary: 100 mg/dL — ABNORMAL HIGH (ref 70–99)
Glucose-Capillary: 93 mg/dL (ref 70–99)
Glucose-Capillary: 82 mg/dL (ref 70–99)
Glucose-Capillary: 84 mg/dL (ref 70–99)
Glucose-Capillary: 102 mg/dL — ABNORMAL HIGH (ref 70–99)
Glucose-Capillary: 105 mg/dL — ABNORMAL HIGH (ref 70–99)

## 2018-08-04 LAB — MAGNESIUM: Magnesium: 2.3 mg/dL (ref 1.7–2.4)

## 2018-08-04 LAB — CBC
HCT: 26 % — ABNORMAL LOW (ref 39.0–52.0)
HEMOGLOBIN: 7.4 g/dL — AB (ref 13.0–17.0)
MCH: 24.4 pg — ABNORMAL LOW (ref 26.0–34.0)
MCHC: 28.5 g/dL — ABNORMAL LOW (ref 30.0–36.0)
MCV: 85.8 fL (ref 80.0–100.0)
Platelets: 160 10*3/uL (ref 150–400)
RBC: 3.03 MIL/uL — AB (ref 4.22–5.81)
RDW: 19 % — ABNORMAL HIGH (ref 11.5–15.5)
WBC: 43.2 10*3/uL — ABNORMAL HIGH (ref 4.0–10.5)
nRBC: 0.9 % — ABNORMAL HIGH (ref 0.0–0.2)

## 2018-08-04 LAB — VALPROIC ACID LEVEL: Valproic Acid Lvl: 42 ug/mL — ABNORMAL LOW (ref 50.0–100.0)

## 2018-08-04 LAB — AMMONIA: Ammonia: 34 umol/L (ref 9–35)

## 2018-08-04 LAB — PHOSPHORUS: Phosphorus: 4.1 mg/dL (ref 2.5–4.6)

## 2018-08-04 MED ORDER — POTASSIUM CHLORIDE 20 MEQ/15ML (10%) PO SOLN
40.0000 meq | Freq: Once | ORAL | Status: AC
  Administered 2018-08-04: 40 meq
  Filled 2018-08-04: qty 30

## 2018-08-04 MED ORDER — LACOSAMIDE 50 MG PO TABS
200.0000 mg | ORAL_TABLET | Freq: Two times a day (BID) | ORAL | Status: DC
  Administered 2018-08-04 – 2018-08-20 (×33): 200 mg
  Filled 2018-08-04: qty 4
  Filled 2018-08-04: qty 1
  Filled 2018-08-04: qty 4
  Filled 2018-08-04: qty 1
  Filled 2018-08-04: qty 4
  Filled 2018-08-04: qty 1
  Filled 2018-08-04 (×3): qty 4
  Filled 2018-08-04 (×2): qty 1
  Filled 2018-08-04 (×3): qty 4
  Filled 2018-08-04 (×2): qty 1
  Filled 2018-08-04 (×5): qty 4
  Filled 2018-08-04: qty 1
  Filled 2018-08-04: qty 4
  Filled 2018-08-04 (×3): qty 1
  Filled 2018-08-04: qty 4
  Filled 2018-08-04 (×3): qty 1
  Filled 2018-08-04 (×2): qty 4
  Filled 2018-08-04: qty 1

## 2018-08-04 MED ORDER — PIPERACILLIN-TAZOBACTAM 3.375 G IVPB
3.3750 g | Freq: Three times a day (TID) | INTRAVENOUS | Status: AC
  Administered 2018-08-04 (×2): 3.375 g via INTRAVENOUS
  Filled 2018-08-04 (×2): qty 50

## 2018-08-04 MED ORDER — VALPROIC ACID 250 MG/5ML PO SOLN
1000.0000 mg | Freq: Four times a day (QID) | ORAL | Status: DC
  Administered 2018-08-04 – 2018-08-07 (×13): 1000 mg
  Filled 2018-08-04 (×15): qty 20

## 2018-08-04 MED ORDER — LEVETIRACETAM 100 MG/ML PO SOLN
500.0000 mg | Freq: Two times a day (BID) | ORAL | Status: DC
  Administered 2018-08-04 – 2018-08-20 (×33): 500 mg
  Filled 2018-08-04 (×35): qty 5

## 2018-08-04 MED ORDER — POTASSIUM CHLORIDE 10 MEQ/50ML IV SOLN: 10.0000 meq | INTRAVENOUS | Status: DC

## 2018-08-04 MED ORDER — LACOSAMIDE 10 MG/ML PO SOLN
200.0000 mg | Freq: Two times a day (BID) | ORAL | Status: DC
  Filled 2018-08-04: qty 20

## 2018-08-04 MED ORDER — FUROSEMIDE 10 MG/ML IJ SOLN
40.0000 mg | Freq: Once | INTRAMUSCULAR | Status: AC
  Administered 2018-08-04: 40 mg via INTRAVENOUS
  Filled 2018-08-04: qty 4

## 2018-08-04 NOTE — Progress Notes (Signed)
LTM EEG discontinued per Dr Leonel Ramsay. Skin integrity intact on scalp.

## 2018-08-04 NOTE — Procedures (Signed)
  CPT/Type of Study: [id number]; 12-26hr continuous EEG with video Recording dates: 08/03/18 @07 :30 to 08/04/18 @07 :30 Recording epoch: 6 Requesting provider: Leonel Ramsay Interpreting physician: Becky Sax, MD  Indications for Procedure: Myoclonus Primary neurological diagnosis: Toxic metabolic disturbance: Sepsis  History: This is a 78 year old patient, undergoing continuous EEG to evaluate for seizures.   EEG Details: Continuous video EEG was performed using standard setting per the guidelines of American Clinical Neurophysiology Society (ACNS). A minimum of 21 electrodes were placed on scalp according to the International 10-20 or 10-10 system. Supplemental electrodes were placed as needed. Single EKG electrode was also used to detect cardiac arrhythmia. Recording was performed at a sampling rate of at least 256 Hz. Patient's behavior was continuously recorded on video simultaneously with EEG. A minimum of 18 channels were used for data display. Each epoch of study was reviewed manually daily and as needed using standard digital review software allowing for montage reformatting, gain and filter changes on a display system of sufficient resolution to prevent aliasing. Computerized quantitative EEG analysis (such as compressed spectral array analysis, dipole analysis, trending, automated spike & seizure detection) was used as indicated.  Description of EEG features: State of patient: Coma  Background Activity Overall Amplitude: medium amplitude (20-50 V), symmetric Predominant Frequency: Intermittent poorly sustained ~6  Hz posterior rhythm with arousals and stimulation. Diffuse  waxing and waning ~10 Hz activity is seen, maximum bi-frontal Superimposed Frequencies: continuous medium amplitude (20-50 V) polymorphic delta rhythms, bilateral and symmetric Reactivity to stimulation: present Asymmetry: No  Breach rhythm: No  Sleep: No normal sleep architecture is seen.  Background  abnormalities: Yes 1. Continuous slow, generalized  Periodic or rhythmic abnormalities: No Epileptiform discharges: No Paroxysmal events or seizures: No Push button events: No  EKG: 90 bpm and irregularly irregular  Impression This continuous EEG is indicative of: 1. A severe diffuse encephalopathy, but non-specific as to etiology. A contribution from sedating medication effect cannot be excluded. There is a degree of improvement compared to previous epoch due to the presence of partial arousals.  No epileptiform discharges, no seizures, and no focal or lateralizing signs are seen.  Recommendations Continue VEEG to ensure the absence of subclinical seizures and guide management.

## 2018-08-04 NOTE — Progress Notes (Signed)
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 He was initially under propofol, and transition to Versed due to severe bradycardia on 3/16, Versed was stopped 3/17  He remains critically ill, intubated, comatose, pupils 4 mm bilaterally equally reactive to light, anasarca, clear breath sounds bilateral, soft nontender abdomen, S1-S2 regular  Labs show sodium has decreased, creatinine remains stable at 2.9, WBC count stable 42 range, baseline 20 K.  Chest x-ray personally reviewed which does not show any infiltrates or effusions.  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-remains comatose likely due to Versed lingering in his system especially in the setting of renal failure. Versed has been stopped 3/17 Neurology to consider stopping EEG monitoring today Continue Depakote, Keppra and Vimpat.  Acute respiratory failure-episode of mucous plugging 3/17 Start SBT's but will hold off extubation Continue hypertonic saline nebs and Mucomyst nebs.  AKI/CKD -replete hypokalemia first and then resume Lasix Continue free water 200 every 4 for hypernatremia  Recurrent Pseudomonas UTI -complete total 10 days of antibiotics and then stop  Unable to interpret WBC count in the setting of myelodysplasia/myelofibrosis, poor prognosis has been outlined before. Need to discuss with family but no family members available at bedside  The patient is critically ill with multiple organ systems failure and requires high complexity decision making for assessment and support, frequent evaluation and titration of therapies, application of advanced monitoring technologies and extensive interpretation of multiple databases. Critical Care Time devoted to patient care services described in this note independent of  APP/resident  time is 35 minutes.   Leanna Sato Elsworth Soho MD

## 2018-08-04 NOTE — Progress Notes (Signed)
Subjective: No changes  Exam: Vitals:   08/04/18 0725 08/04/18 0754  BP:  (!) 160/79  Pulse:  94  Resp:  17  Temp: (!) 97.4 F (36.3 C)   SpO2:  100%   Gen: In bed, intubated Resp: ventilated Abd: soft, nt  Neuro: MS: does not open eyes or follow commands [id number]:[id number], eyes midline Motor: does not respond to nox stim.  Sensory: as above  Pertinent Labs: Depakote level 42 Cr 3.19 Ammonia 3/11 18 MRI brain 3/12 limited to DWI, T2, flair, but no clear abnormality.  CSF - not concerning for infection  EEG reviewed: no sz, no stim indeuced discharges    Impression: 78 yo M admitted initially with Pseudomonas UTI who developed  encephaloapthy with bifrontal triphasic appearing discharges concerning for possible status epilepticus.  He did appear to have some response to Ativan, and there is also question on Friday that he may have some left arm jerking associated with discharges.  For this reason, this has been treated aggressively as status epilepticus.  I continue to think that cefepime may be playing a role in his current presentation given that it can cause an EEG very similar but it could also cause nonconvulsive status epilepticus.    With his renal dysfunction, it is likely that he will take quite a while to clear the Versed. Also, I will decrease his vimpat dose.   With albumin 1.9, I think that Depakote of 42 is likely therapeutic.   Recommendations: 1) Keppra 500mg  BID(renally dosed) 2) continue Depakote 1 g 4 times daily, daily levels, last changed 3/18 3) continue Vimpat 100 twice daily   This patient is critically ill and at significant risk of neurological worsening, death and care requires constant monitoring of vital signs, hemodynamics,respiratory and cardiac monitoring, neurological assessment, discussion with family, other specialists and medical decision making of high complexity. I spent 40 minutes of neurocritical care time  in the care of  this patient.  This was time spent independent of any time provided by nurse practitioner or PA.  Roland Rack, MD [facility] [phone]  If 7pm- 7am, please page neurology on call as listed in [place]. 08/04/2018  8:31 AM

## 2018-08-04 NOTE — Progress Notes (Signed)
eLink Physician-Brief Progress Note Patient Name: Juan [name] DOB: [birth year] MRN: [id number]   Date of Service  08/04/2018  HPI/Events of Note  Notified of low K at 2.8. BUN/Crea 98/2.99.  eICU Interventions  Replete K -5meq via tube.     Intervention Category Minor Interventions: Electrolytes abnormality - evaluation and management  Elsie Lincoln 08/04/2018, 6:45 AM

## 2018-08-04 NOTE — Progress Notes (Signed)
vimpat 45cc wasted in narcotic waste container. Witnessed by Terrace [place]

## 2018-08-04 NOTE — Progress Notes (Signed)
[facility] ADULT ICU REPLACEMENT PROTOCOL FOR AM LAB REPLACEMENT ONLY  The patient does not apply for the [facility] Adult ICU Electrolyte Replacment Protocol based on the criteria listed below:   1. Is GFR >/= 40 ml/min? No.  Patient's GFR today is 19   4. Abnormal electrolyte(s): K2.8  6. If a panic level lab has been reported, has the CCM MD in charge been notified? Yes.  .   Physician:  Jefferey Pica MD  Vear Clock 08/04/2018 6:39 AM

## 2018-08-05 DIAGNOSIS — L8990 Pressure ulcer of unspecified site, unspecified stage: Secondary | ICD-10-CM

## 2018-08-05 LAB — GLUCOSE, CAPILLARY
Glucose-Capillary: 102 mg/dL — ABNORMAL HIGH (ref 70–99)
Glucose-Capillary: 106 mg/dL — ABNORMAL HIGH (ref 70–99)
Glucose-Capillary: 106 mg/dL — ABNORMAL HIGH (ref 70–99)
Glucose-Capillary: 110 mg/dL — ABNORMAL HIGH (ref 70–99)
Glucose-Capillary: 94 mg/dL (ref 70–99)
Glucose-Capillary: 98 mg/dL (ref 70–99)

## 2018-08-05 LAB — CULTURE, RESPIRATORY W GRAM STAIN

## 2018-08-05 LAB — CBC
HCT: 25.2 % — ABNORMAL LOW (ref 39.0–52.0)
Hemoglobin: 7.2 g/dL — ABNORMAL LOW (ref 13.0–17.0)
MCH: 24.3 pg — ABNORMAL LOW (ref 26.0–34.0)
MCHC: 28.6 g/dL — ABNORMAL LOW (ref 30.0–36.0)
MCV: 85.1 fL (ref 80.0–100.0)
PLATELETS: 162 10*3/uL (ref 150–400)
RBC: 2.96 MIL/uL — ABNORMAL LOW (ref 4.22–5.81)
RDW: 19.2 % — AB (ref 11.5–15.5)
WBC: 47 10*3/uL — ABNORMAL HIGH (ref 4.0–10.5)
nRBC: 0.9 % — ABNORMAL HIGH (ref 0.0–0.2)

## 2018-08-05 LAB — C DIFFICILE QUICK SCREEN W PCR REFLEX
C Diff antigen: NEGATIVE
C Diff interpretation: NOT DETECTED
C Diff toxin: NEGATIVE

## 2018-08-05 LAB — BASIC METABOLIC PANEL
Anion gap: 12 (ref 5–15)
BUN: 108 mg/dL — ABNORMAL HIGH (ref 8–23)
CO2: 16 mmol/L — ABNORMAL LOW (ref 22–32)
Calcium: 9.5 mg/dL (ref 8.9–10.3)
Chloride: 122 mmol/L — ABNORMAL HIGH (ref 98–111)
Creatinine, Ser: 2.96 mg/dL — ABNORMAL HIGH (ref 0.61–1.24)
GFR calc Af Amer: 23 mL/min — ABNORMAL LOW (ref >60)
GFR, EST NON AFRICAN AMERICAN: 20 mL/min — AB (ref >60)
Glucose, Bld: 131 mg/dL — ABNORMAL HIGH (ref 70–99)
Potassium: 3.9 mmol/L (ref 3.5–5.1)
Sodium: 150 mmol/L — ABNORMAL HIGH (ref 135–145)

## 2018-08-05 LAB — MISC LABCORP TEST (SEND OUT): Labcorp test code: 706499

## 2018-08-05 LAB — VALPROIC ACID LEVEL: Valproic Acid Lvl: 40 ug/mL — ABNORMAL LOW (ref 50.0–100.0)

## 2018-08-05 NOTE — Progress Notes (Signed)
NAME:  Juan [name], MRN:  [id number], DOB:  1940-09-01, LOS: 12 ADMISSION DATE:  08/11/2018, CONSULTATION DATE:  3/13 REFERRING MD:  Lupita Leash, CHIEF COMPLAINT:  Acute encephalopathy and worsening mental status    Brief History   78 year old male patient admitted initially on 3/7 with sepsis secondary secondary to pseudomonas urinary tract infection with associated acute on chronic renal failure secondary to obstructive uropathy.  Over the course of his hospitalization developed progressive encephalopathy, with concern for nonconvulsive status on 3/12.  This was felt possibly secondary to neurotoxicity from cefepime.  Intubated for airway protection. He was initially under propofol, and transition to Versed due to severe bradycardia on 3/16, Versed was stopped 3/17     Past Medical History  Chronic urinary retention with prostate hypertrophy, prior Pseudomonas urinary tract infections, stage III chronic kidney disease, moderate to severe pulmonary hypertension, chronic diastolic heart failure, myelodysplasia with myelodysplastic syndrome, chronic back pain  Significant Hospital Events   3/7: Admitted with presumptive diagnosis of urinary tract infection 3/9: worsening renal failure, seen by urology, Foley catheter placed 3/12: Worsening mental status, EEG worrisome for possible nonconvulsive status.  Felt possibly due to neurotoxicity from cefepime this was discontinued and Zosyn was started.  lumbar puncture performed, for possible meningitis.  Infectious disease consulted, neurology consulted, started on Keppra, moved to [facility] for continuous EEG monitoring 3/13 remains unresponsive, developed worsening hypoxia staph from prior LP unremarkable without evidence of meningitis.  Pulmonary asked to evaluate 3/17 Episode of bradycardia overnight in spite of stopping propofol hence dopamine started. Episode of mucus plugging  Consults:   ID Neurology Urology PCCM  Procedures:   3/12 Fluoro-guided LP  performed at L3-L4. Opening pressure 12 cm H20. 13.5 ml clear CSF obtained Significant Diagnostic Tests:  CT abdomen pelvis 3/7 showed bilateral hydronephrosis with bladder wall thickening and multiple bladder diverticuli suggesting chronic bladder outlet obstruction with cystitis MRI brain 3/12: Truncated and motion degraded examination without visible acute abnormality.  MRI L spine 3/12:1. Mild foraminal narrowing bilaterally at L4-5 is worse on the left.2. Chronic endplate changes at N8-2 with associated Schmorl's node. 3. Minimal rightward disc bulging without significant stenosis.4. 10 mm solid lesion at the upper pole of the right kidney was not well seen on recent CT scans. Small renal cell carcinoma is not excluded. Recommend dedicated MRI of the kidneys as the patient's condition allows. 5. Stable cyst at the lower pole of the right kidney.  EEG 3/12 frequent periodic discharges concerning for triphasic versus epileptiform GPD's 3/17 EEG >>burst suppression, triphasics GPD's US abdomen 3/12: 1. No acute findings. 2. Single visualized gallstone. No evidence of acute cholecystitis. No other abnormality.  Micro Data:  UC 3/7 pseudomonas (cipro resistant) Urine cx 3/12 >> pseudomonas BCx2 3/8>>> neg BCX2 3/12>>>neg CSF 3/12: neg resp culture 3/13>>>nml flora 3/16 urine >> pseudomonas resp 3/18 >> providencia  Antimicrobials:  Cefepime 3/7>>3/12 Zosyn 3/12>>> 3/19 zyvox 3/13>>>3/15 Interim history/subjective:  Versed was stopped 3/17 but he remains comatose Afebrile   Objective   Blood pressure (!) 146/66, pulse 98, temperature 98 F (36.7 C), temperature source Oral, resp. rate 19, height 6\' 3"  (1.905 m), weight 93 kg, SpO2 100 %.    Vent Mode: PSV;CPAP FiO2 (%):  [30 %] 30 % Set Rate:  [16 bmp] 16 bmp Vt Set:  [670 mL] 670 mL PEEP:  [5 cmH20] 5 cmH20 Pressure Support:  [5 cmH20] 5 cmH20 Plateau Pressure:  [12 cmH20-16 cmH20] 15 cmH20   Intake/Output Summary  (Last 24 hours)  at 08/05/2018 7425 Last data filed at 08/05/2018 0400 Gross per 24 hour  Intake 1760 ml  Output 1395 ml  Net 365 ml   Filed Weights   08/03/18 0500 08/04/18 0500 08/05/18 0413  Weight: 92.7 kg 92.3 kg 93 kg   Examination: Acutely ill, elderly man, Orally intubated Mild pallor, no icterus, no JVD 2+ anasarca Decreased breath sounds bilateral, no rhonchi S1-S2 normal Soft nontender abdomen RA SS -5, induced coma, does not respond to sternal rub  Chest x-ray personally reviewed which does not show any infiltrates or effusions  Resolved Hospital Problem list   Transient bradycardia-due to propofol, resolved  Assessment & Plan:   Acute metabolic encephalopathy.  Nonconvulsive status 2/2 neurotoxicity from?cefepime Now in induced coma, propofol changed to Versed 3/17 Plan  LTM EEG from 3/19 appears encouraging for some partial arousals Continue Depakote, Keppra and Vimpat   Acute hypoxic respiratory failure 2/2 evolving pulmonary infiltrates and atx super imposed on underlying severe PH and OSA  percent of mucous plugging 3/16 Plan Full vent support for now Hypertonic saline nebs Await sensitivity on Providencia in  respiratory culture  Hypertension-resume home dose of hydralazine Dc Clonidine patch   Acute on chronic renal failure (st III CKD) 2/2 chronic urinary retention w/ h/o poorly functioning bladder, enlarged prostate and mild to mod obstruction. Has had multiple recurrent UTIs. Has not completed treatment in past d/t financial restraints.  Hyperchloremia amd Non-anion gap metabolic acidosis Solid lesion on upper pole of right kidney.  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 - Not been treated due to poor performance status, last oncology visit 06/10/2018 CT abdomen imaging also noting innumerable lytic lesions involving the spine Leukocytosis Plan Has baseline leukocytosis 22k range , makes WBC count difficult to interpret   Iron def anemia Plan Transfuse for hemoglobin 7 or lower    Best practice:  Diet: NPO Pain/Anxiety/Delirium protocol (if indicated): 3/13 VAP protocol (if indicated): 3/13 DVT prophylaxis: [place] heparin  GI prophylaxis: H2B Glucose control: ssi Mobilitity BR Code Status: full code  Family Communication: pending Disposition: Remains in ICU while intubated/seizures  Summary-prognosis guarded due to resistant MDR infections, hopefully mental status will improve once Versed comes out of his system may take a few more days  The patient is critically ill with multiple organ systems failure and requires high complexity decision making for assessment and support, frequent evaluation and titration of therapies, application of advanced monitoring technologies and extensive interpretation of multiple databases. Critical Care Time devoted to patient care services described in this note independent of APP/resident  time is 31 minutes.    Kara Mead MD. Shade Flood. [place] [facility] Pager [phone] If no response call [phone]   08/05/2018

## 2018-08-05 NOTE — Progress Notes (Signed)
Subjective: No changes  Exam: Vitals:   08/05/18 1206 08/05/18 1227  BP: (!) 174/69 (!) 185/80  Pulse: 91   Resp: (!) 21   Temp:    SpO2: 100%    Gen: In bed, intubated Resp: ventilated Abd: soft, nt  Neuro: MS: does not open eyes or follow commands TT:CNGFR, eyes midline Motor: minimal flexion BLE to stim, no response UE Sensory: as above  Pertinent Labs: Depakote level 40 Cr 3.19 Ammonia 3/11 18 MRI brain 3/12 limited to DWI, T2, flair, but no clear abnormality.  CSF - not concerning for infection   Impression: 77 yo M admitted initially with Pseudomonas UTI who developed  encephaloapthy with bifrontal triphasic appearing discharges concerning for possible status epilepticus.  He did appear to have some response to Ativan, and there is also question on Friday that he may have some left arm jerking associated with discharges.  For this reason, this has been treated aggressively as status epilepticus.  I continue to think that cefepime may be playing a role in his current presentation given that it can cause an EEG very similar but it could also cause nonconvulsive status epilepticus.    With his renal dysfunction, it is likely that he will take quite a while to clear the Versed, though we are on the long end for either cefepime or versed.   With albumin 1.9, I think that Depakote of 40 is likely therapeutic.   Recommendations: 1) Keppra 500mg  BID(renally dosed) 2) continue Depakote 1 g 4 times daily, daily levels, last changed 3/18 3) continue Vimpat 100 twice daily 4) repeat MRI  Roland Rack, MD [facility] [phone]  If 7pm- 7am, please page neurology on call as listed in [place]. 08/05/2018  1:31 PM

## 2018-08-05 NOTE — Progress Notes (Signed)
Nutrition Follow-up  DOCUMENTATION CODES:   Not applicable  INTERVENTION:   Continue TF via OGT:  Vital AF 1.2 at 60 ml/h (1440 ml per day)  Pro-stat 30 ml BID  Provides 1928 kcal, 138 gm protein, 1168 ml free water daily  NUTRITION DIAGNOSIS:   Inadequate oral intake related to inability to eat as evidenced by NPO status.  Ongoing   GOAL:   Patient will meet greater than or equal to 90% of their needs  Met with TF  MONITOR:   Vent status, Labs, Skin, I & O's  ASSESSMENT:   78 yo male with PMH of HTN, CAD, DM-2, myeloproliferative disease, CHF, iron deficiency, CKD who was admitted on 3/8 with complicated UTI. Transferred to the ICU and intubated on 3/13 for acute encephalopathy and worsening mental status.  OGT in place, currently receiving Vital AF 1.2 At 60 ml/h with Pro-stat 30 ml BID. Tolerating TF without difficulty.  Patient is currently intubated on ventilator support MV: 11 L/min Temp (24hrs), Avg:98.3 F (36.8 C), Min:97.6 F (36.4 C), Max:98.7 F (37.1 C)  Labs reviewed. Sodium 150 (H), BUN 108 (H), creatinine 2.96 (H) Medications reviewed and include free water 200 ml every 4 hours, Keppra.  Prognosis remains guarded due to MDR infections.  Weight up 10 kg since admission. I/O + 7.25 L since admission.  Diet Order:   Diet Order            Diet NPO time specified  Diet effective now              EDUCATION NEEDS:   No education needs have been identified at this time  Skin:  Skin Assessment: Skin Integrity Issues: Skin Integrity Issues:: Stage I Stage I: R ear  Last BM:  3/20 (type 7)  Height:   Ht Readings from Last 1 Encounters:  07/29/18 '6\' 3"'$  (1.905 m)    Weight:   Wt Readings from Last 1 Encounters:  08/05/18 93 kg    Ideal Body Weight:  89.1 kg  BMI:  Body mass index is 25.63 kg/m.  Estimated Nutritional Needs:   Kcal:  2005  Protein:  120-140 gm  Fluid:  2 L    Molli Barrows, RD, LDN, CNSC Pager  [phone] After Hours Pager [phone]

## 2018-08-06 ENCOUNTER — Inpatient Hospital Stay (HOSPITAL_COMMUNITY): Payer: Medicare Other

## 2018-08-06 LAB — GLUCOSE, CAPILLARY
Glucose-Capillary: 100 mg/dL — ABNORMAL HIGH (ref 70–99)
Glucose-Capillary: 104 mg/dL — ABNORMAL HIGH (ref 70–99)
Glucose-Capillary: 105 mg/dL — ABNORMAL HIGH (ref 70–99)
Glucose-Capillary: 107 mg/dL — ABNORMAL HIGH (ref 70–99)
Glucose-Capillary: 111 mg/dL — ABNORMAL HIGH (ref 70–99)

## 2018-08-06 LAB — BASIC METABOLIC PANEL
Anion gap: 12 (ref 5–15)
BUN: 114 mg/dL — ABNORMAL HIGH (ref 8–23)
CO2: 17 mmol/L — ABNORMAL LOW (ref 22–32)
Calcium: 9.6 mg/dL (ref 8.9–10.3)
Chloride: 122 mmol/L — ABNORMAL HIGH (ref 98–111)
Creatinine, Ser: 2.57 mg/dL — ABNORMAL HIGH (ref 0.61–1.24)
GFR calc Af Amer: 27 mL/min — ABNORMAL LOW (ref >60)
GFR, EST NON AFRICAN AMERICAN: 23 mL/min — AB (ref >60)
Glucose, Bld: 120 mg/dL — ABNORMAL HIGH (ref 70–99)
Potassium: 3.7 mmol/L (ref 3.5–5.1)
Sodium: 151 mmol/L — ABNORMAL HIGH (ref 135–145)

## 2018-08-06 LAB — CBC
HCT: 25.7 % — ABNORMAL LOW (ref 39.0–52.0)
Hemoglobin: 7.2 g/dL — ABNORMAL LOW (ref 13.0–17.0)
MCH: 24.2 pg — ABNORMAL LOW (ref 26.0–34.0)
MCHC: 28 g/dL — ABNORMAL LOW (ref 30.0–36.0)
MCV: 86.2 fL (ref 80.0–100.0)
PLATELETS: 182 10*3/uL (ref 150–400)
RBC: 2.98 MIL/uL — AB (ref 4.22–5.81)
RDW: 19.4 % — ABNORMAL HIGH (ref 11.5–15.5)
WBC: 53.3 10*3/uL (ref 4.0–10.5)
nRBC: 0.9 % — ABNORMAL HIGH (ref 0.0–0.2)

## 2018-08-06 LAB — PROCALCITONIN: Procalcitonin: 1.05 ng/mL

## 2018-08-06 LAB — VALPROIC ACID LEVEL: Valproic Acid Lvl: 40 ug/mL — ABNORMAL LOW (ref 50.0–100.0)

## 2018-08-06 LAB — TSH: TSH: 4.652 u[IU]/mL — ABNORMAL HIGH (ref 0.350–4.500)

## 2018-08-06 MED ORDER — SODIUM CHLORIDE 0.9 % IV SOLN
1.0000 g | INTRAVENOUS | Status: DC
  Administered 2018-08-06 – 2018-08-09 (×4): 1 g via INTRAVENOUS
  Filled 2018-08-06 (×4): qty 10

## 2018-08-06 MED ORDER — FUROSEMIDE 10 MG/ML IJ SOLN
40.0000 mg | Freq: Once | INTRAMUSCULAR | Status: AC
  Administered 2018-08-06: 40 mg via INTRAVENOUS
  Filled 2018-08-06: qty 4

## 2018-08-06 NOTE — Progress Notes (Signed)
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 He was initially under propofol, and transition to Versed due to severe bradycardia on 3/16, Versed was stopped 3/17  But he remains comatose, orally intubated, minimal response to deep pain stimulus, anasarca, soft nontender abdomen, decreased breath sounds bilateral, S1-S2 regular.  Labs show WBC count has increased to 52K, sodium rising to 151, hyperchloremia, creatinine decreased to 2.5.  Chest x-ray from 3/19 shows bibasilar atelectasis/infiltrates.  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 -he remains comatose due to Versed still hanging around in the system -may take some more time to clear especially given his renal failure Neurology planning on MRI Continue Depakote, Keppra and Vimpat  Acute respiratory failure with episode of mucous plugging 3/17 -Tolerating pressure support 10/5 but hold off extubation -Continue airway clearance measures with hypertonic saline nebs  AKI/CKD -resume Lasix 40 Hypernatremia has not corrected with free water, will add D5W  Unable to interpret leukocytosis in the setting of myelodysplasia/myelofibrosis -He has been treated for Pseudomonas UTI with 10 days of antibiotics but has not cleared this.  ID suggest that this is colonization -Now has [facility] ER and is sputum which I will treat with ceftriaxone for 5 days  Updated son 3/20, guarded prognosis given  The patient is critically ill with multiple organ systems failure and requires high complexity decision making for assessment and support, frequent evaluation and titration of therapies, application of advanced monitoring technologies and extensive interpretation of multiple databases. Critical Care Time devoted to patient care services described  in this note independent of APP/resident  time is 35 minutes.   Kara Mead MD. Shade Flood. [place] [facility] Pager [phone] If no response call [phone]   08/06/2018

## 2018-08-06 NOTE — Progress Notes (Signed)
Subjective: No changes  Exam: Vitals:   08/06/18 0718 08/06/18 0744  BP:  126/66  Pulse:  97  Resp:  17  Temp: 99 F (37.2 C)   SpO2:  100%   Gen: In bed, intubated Resp: ventilated Abd: soft, nt  Neuro: MS: does not open eyes or follow commands DU:KGURK, eyes midline Motor: minimal flexion BLE to stim, no response UE Sensory: as above  Pertinent Labs: Depakote level 40 Cr 3.19 Ammonia 3/11 18 MRI brain 3/12 limited to DWI, T2, flair, but no clear abnormality.  CSF 3/12 - WBC 2 RBC 2 Protein 19 Glucose 68   Impression: 78 yo M admitted initially with Pseudomonas UTI who developed  encephaloapthy with bifrontal triphasic appearing discharges concerning for possible status epilepticus.  He did appear to have some response to Ativan, and there is also question on Friday 3/13 that he may have some left arm jerking associated with discharges.  For this reason, this has been treated aggressively as status epilepticus.  I continue to think that cefepime may have played a role in his current presentation given that it can cause an EEG very similar but it could also have been nonconvulsive status epilepticus.    With his renal dysfunction, it is likely that he will take quite a while to clear the Versed, though we are on the long end for either cefepime or versed.   With albumin 1.9, I think that Depakote of 40 is likely therapeutic.   Recommendations: 1) Keppra 500mg  BID(renally dosed) 2) continue Depakote 1 g 4 times daily, free level sent 3/19 3) continue Vimpat 100 twice daily 4) repeat MRI, TSH  Roland Rack, MD [facility] [phone]  If 7pm- 7am, please page neurology on call as listed in [place]. 08/06/2018  7:57 AM

## 2018-08-06 NOTE — Progress Notes (Signed)
CRITICAL VALUE ALERT  Critical Value:  53.3 WBC  Date & Time Notied:  03/21  0623  Provider Notified: Margaree Mackintosh MD via Elta Guadeloupe, RN  Orders Received/Actions taken: awaiting orders

## 2018-08-06 NOTE — Progress Notes (Signed)
RT NOTE:  Transported ventilator pt from 2M09 to MRI and back without incident.

## 2018-08-07 ENCOUNTER — Inpatient Hospital Stay (HOSPITAL_COMMUNITY): Payer: Medicare Other

## 2018-08-07 LAB — CBC
HCT: 26.6 % — ABNORMAL LOW (ref 39.0–52.0)
Hemoglobin: 7.5 g/dL — ABNORMAL LOW (ref 13.0–17.0)
MCH: 24 pg — ABNORMAL LOW (ref 26.0–34.0)
MCHC: 28.2 g/dL — AB (ref 30.0–36.0)
MCV: 85.3 fL (ref 80.0–100.0)
Platelets: 214 10*3/uL (ref 150–400)
RBC: 3.12 MIL/uL — ABNORMAL LOW (ref 4.22–5.81)
RDW: 19.6 % — ABNORMAL HIGH (ref 11.5–15.5)
WBC: 64.2 10*3/uL (ref 4.0–10.5)
nRBC: 0.9 % — ABNORMAL HIGH (ref 0.0–0.2)

## 2018-08-07 LAB — GLUCOSE, CAPILLARY
Glucose-Capillary: 106 mg/dL — ABNORMAL HIGH (ref 70–99)
Glucose-Capillary: 110 mg/dL — ABNORMAL HIGH (ref 70–99)
Glucose-Capillary: 116 mg/dL — ABNORMAL HIGH (ref 70–99)
Glucose-Capillary: 117 mg/dL — ABNORMAL HIGH (ref 70–99)
Glucose-Capillary: 119 mg/dL — ABNORMAL HIGH (ref 70–99)
Glucose-Capillary: 98 mg/dL (ref 70–99)

## 2018-08-07 LAB — BASIC METABOLIC PANEL
Anion gap: 14 (ref 5–15)
BUN: 124 mg/dL — ABNORMAL HIGH (ref 8–23)
CO2: 16 mmol/L — ABNORMAL LOW (ref 22–32)
CREATININE: 2.33 mg/dL — AB (ref 0.61–1.24)
Calcium: 9.7 mg/dL (ref 8.9–10.3)
Chloride: 121 mmol/L — ABNORMAL HIGH (ref 98–111)
GFR calc Af Amer: 30 mL/min — ABNORMAL LOW (ref >60)
GFR calc non Af Amer: 26 mL/min — ABNORMAL LOW (ref >60)
Glucose, Bld: 138 mg/dL — ABNORMAL HIGH (ref 70–99)
Potassium: 3.3 mmol/L — ABNORMAL LOW (ref 3.5–5.1)
SODIUM: 151 mmol/L — AB (ref 135–145)

## 2018-08-07 LAB — PHOSPHORUS: Phosphorus: 4.7 mg/dL — ABNORMAL HIGH (ref 2.5–4.6)

## 2018-08-07 LAB — PROCALCITONIN: PROCALCITONIN: 1.1 ng/mL

## 2018-08-07 LAB — MAGNESIUM: Magnesium: 2.5 mg/dL — ABNORMAL HIGH (ref 1.7–2.4)

## 2018-08-07 MED ORDER — VALPROIC ACID 250 MG/5ML PO SOLN
750.0000 mg | Freq: Four times a day (QID) | ORAL | Status: DC
  Administered 2018-08-07 – 2018-08-08 (×3): 750 mg
  Filled 2018-08-07 (×7): qty 15

## 2018-08-07 MED ORDER — ALBUMIN HUMAN 5 % IV SOLN
12.5000 g | Freq: Once | INTRAVENOUS | Status: AC
  Administered 2018-08-07: 12.5 g via INTRAVENOUS
  Filled 2018-08-07: qty 250

## 2018-08-07 MED ORDER — FUROSEMIDE 10 MG/ML IJ SOLN
40.0000 mg | Freq: Once | INTRAMUSCULAR | Status: AC
  Administered 2018-08-07: 40 mg via INTRAVENOUS
  Filled 2018-08-07: qty 4

## 2018-08-07 NOTE — Progress Notes (Signed)
Subjective: Possibly some slight improvement  Exam: Vitals:   08/07/18 1126 08/07/18 1202  BP:  (!) 123/55  Pulse:  (!) 107  Resp:  17  Temp: (!) 97.4 F (36.3 C)   SpO2:  99%   Gen: In bed, intubated Resp: ventilated Abd: soft, nt  Neuro: MS: Eyes open partially to noxious stimulation AL:PFXTK, eyes midline, corneals intact, doll's eye intact, cough and gag intact Motor: Grimaces and has minimal flexion in all 4 extremities Sensory: as above  Pertinent Labs: Total Depakote 3/19-62, free Depakote 3/19- 38 (upper limit of normal is 22) Cr 3.19 Ammonia 3/11 18 MRI brain 3/12 limited to DWI, T2, flair, but no clear abnormality.  CSF 3/12 - WBC 2 RBC 2 Protein 19 Glucose 68   Impression: 79 yo M admitted initially with Pseudomonas UTI who developed  encephaloapthy with bifrontal triphasic appearing discharges concerning for possible status epilepticus.  He did appear to have some response to Ativan, and there is also question on Friday 3/13 that he may have some left arm jerking associated with discharges.  For this reason, this has been treated aggressively as status epilepticus with Versed from 3/13 through 3/15, titrated some beta suppression of the bifrontal discharges.  Subsequently, propofol was started on 3/15, and titrated upwards, but did not achieve a bar suppression until the evening of 3/16 which was stopped after about 8 hours of suppression due to bradycardia.  He was started back on Versed that evening which was weaned to off on 3/17.  Since that time, he has continued be severely encephalopathic.  I continue to think that cefepime may have played a role in his current presentation given that it can cause an EEG very similar but it could also have been nonconvulsive status epilepticus.    With his renal dysfunction, it is likely that he will take quite a while to clear the Versed, though we are on the long end for either cefepime or versed.   With markedly elevated  free level, Depakote may be playing a role in his continued encephalopathy and I will decrease this today.  Recommendations: 1) Keppra 500mg  BID(renally dosed) 2) decrease Depakote to 750 every 6 hours, daily levels, I think a goal level around 30 would be appropriate. 3) continue Vimpat 100 twice daily 4) repeat MRI, TSH  Roland Rack, MD [facility] [phone]  If 7pm- 7am, please page neurology on call as listed in [place]. 08/07/2018  12:11 PM

## 2018-08-07 NOTE — Progress Notes (Addendum)
NAME:  Juan [name], MRN:  [id number], DOB:  1940-11-27, LOS: 77 ADMISSION DATE:  08/03/2018, CONSULTATION DATE:  3/13 REFERRING MD:  Lupita Leash, CHIEF COMPLAINT:  Acute encephalopathy and worsening mental status    Brief History   78 year old male patient admitted initially on 3/7 with sepsis secondary secondary to pseudomonas urinary tract infection with associated acute on chronic renal failure secondary to obstructive uropathy.  Over the course of his hospitalization developed progressive encephalopathy, with concern for nonconvulsive status on 3/12.  This was felt possibly secondary to neurotoxicity from cefepime.  Intubated for airway protection. He was initially under propofol, and transition to Versed due to severe bradycardia on 3/16, Versed was stopped 3/17     Past Medical History  Chronic urinary retention with prostate hypertrophy, prior Pseudomonas urinary tract infections, stage III chronic kidney disease, moderate to severe pulmonary hypertension, chronic diastolic heart failure, myelodysplasia with myelodysplastic syndrome, chronic back pain  Significant Hospital Events   3/7: Admitted with presumptive diagnosis of urinary tract infection 3/9: worsening renal failure, seen by urology, Foley catheter placed 3/12: Worsening mental status, EEG worrisome for possible nonconvulsive status.  Felt possibly due to neurotoxicity from cefepime this was discontinued and Zosyn was started.  lumbar puncture performed, for possible meningitis.  Infectious disease consulted, neurology consulted, started on Keppra, moved to [facility] for continuous EEG monitoring 3/13 remains unresponsive, developed worsening hypoxia staph from prior LP unremarkable without evidence of meningitis.  Pulmonary asked to evaluate 3/17 Episode of bradycardia overnight in spite of stopping propofol hence dopamine started. Episode of mucus plugging 3/17 versed stopped  Consults:   ID Neurology Urology PCCM  Procedures:    3/12 Fluoro-guided LP performed at L3-L4. Opening pressure 12 cm H20. 13.5 ml clear CSF obtained Significant Diagnostic Tests:  CT abdomen pelvis 3/7 showed bilateral hydronephrosis with bladder wall thickening and multiple bladder diverticuli suggesting chronic bladder outlet obstruction with cystitis MRI brain 3/12: Truncated and motion degraded examination without visible acute abnormality.  MRI L spine 3/12:1. Mild foraminal narrowing bilaterally at L4-5 is worse on the left.2. Chronic endplate changes at P7-1 with associated Schmorl's node. 3. Minimal rightward disc bulging without significant stenosis.4. 10 mm solid lesion at the upper pole of the right kidney was not well seen on recent CT scans. Small renal cell carcinoma is not excluded. Recommend dedicated MRI of the kidneys as the patient's condition allows. 5. Stable cyst at the lower pole of the right kidney.  EEG 3/12 frequent periodic discharges concerning for triphasic versus epileptiform GPD's 3/17 EEG >>burst suppression, triphasics GPD's US abdomen 3/12: 1. No acute findings. 2. Single visualized gallstone. No evidence of acute cholecystitis. No other abnormality.  MRI brain 3/21 neg  Micro Data:  UC 3/7 pseudomonas (cipro resistant) Urine cx 3/12 >> pseudomonas BCx2 3/8>>> neg BCX2 3/12>>>neg CSF 3/12: neg resp culture 3/13>>>nml flora 3/16 urine >> pseudomonas resp 3/18 >> providencia  Antimicrobials:  Cefepime 3/7>>3/12 Zosyn 3/12>>> 3/19 zyvox 3/13>>>3/15 ceftx 3/21 (HCAP ) >> Interim history/subjective:  Remains critically ill, comatose Urine output slightly better with Lasix but still in positive balance   Objective   Blood pressure 138/66, pulse (!) 118, temperature 98.1 F (36.7 C), temperature source Axillary, resp. rate 18, height 6\' 3"  (1.905 m), weight 89.9 kg, SpO2 100 %.    Vent Mode: PSV;CPAP FiO2 (%):  [30 %-40 %] 30 % Set Rate:  [16 bmp] 16 bmp Vt Set:  [670 mL] 670 mL PEEP:  [5  cmH20] 5 cmH20 Pressure Support:  [  [place] Pressure:  [12 cmH20-25 cmH20] 16 cmH20   Intake/Output Summary (Last 24 hours) at 08/07/2018 0755 Last data filed at 08/07/2018 0630 Gross per 24 hour  Intake 1900 ml  Output 1420 ml  Net 480 ml   Filed Weights   08/05/18 0413 08/06/18 0416 08/07/18 0246  Weight: 93 kg 92 kg 89.9 kg   Examination: Acutely ill, elderly man, Orally intubated Mild pallor, no icterus, no JVD 2+ anasarca Decreased breath sounds bilateral, no rhonchi S1-S2 normal Soft nontender abdomen RA SS -5, induced coma, some wincing but does not open eyes to deep pain stimulus, does not follow commands , pulse bilateral equal reactive to light  Wrist x-ray personally reviewed which shows left hazy infrahilar infiltrate Resolved Hospital Problem list   Transient bradycardia-due to propofol, resolved  Assessment & Plan:   Acute metabolic encephalopathy.  Nonconvulsive status 2/2 neurotoxicity from?cefepime Now  induced coma from residual Versed, propofol changed to Versed 3/17 and this is probably still lingering in his system  LTM EEG from 3/19 appears encouraging for some partial arousals Plan Continue Depakote, Keppra and Vimpat   Acute hypoxic respiratory failure 2/2 evolving pulmonary infiltrates and atx super imposed on underlying severe PH and OSA  percent of mucous plugging 3/16 Plan Spontaneous breathing trials, tolerates 5/5 but no extubation Hypertonic saline nebs   Hypertension-resumed home dose of hydralazine Dc Clonidine patch   Acute on chronic renal failure (st III CKD) 2/2 chronic urinary retention w/ h/o poorly functioning bladder, enlarged prostate and mild to mod obstruction. Has had multiple recurrent UTIs. Has not completed treatment in past d/t financial restraints.  Hyperchloremia amd Non-anion gap metabolic acidosis Solid lesion on upper pole of right kidney.  Hypokalemia Plan Avoid nephrotoxins Trial albumin plus  Lasix -since BUN rising Continue free water Renal dose medications  Providencia H CAP with new infiltrate Complicated Pseudomonas Urinary tract infection in setting of obstructive uropathy-chronic Foley catheter, colonizer per ID, treated with 10 days of antibiotics total Plan Restart ceftriaxone 3/21   H/o myelodysplastic syndrome / myelofibrosis with poor prognosis - Not been treated due to poor performance status, last oncology visit 06/10/2018 CT abdomen imaging also noting innumerable lytic lesions involving the spine Leukocytosis- baseline 22k range Plan WBC count difficult to interpret ,pct also unreliable in immunocompromised patient but slight high hence will favor treating H CAP   Iron def anemia Plan Transfuse for hemoglobin 7 or lower    Best practice:  Diet: NPO Pain/Anxiety/Delirium protocol (if indicated): 3/13 VAP protocol (if indicated): 3/13 DVT prophylaxis: [place] heparin  GI prophylaxis: H2B Glucose control: ssi Mobilitity BR Code Status: full code  Family Communication: son Disposition: Remains in ICU while intubated/waiting for him to wake up  Summary-prognosis guarded due to resistant MDR infections, hopefully mental status will improve once Versed metabolizes out of his system, may take a few more days  The patient is critically ill with multiple organ systems failure and requires high complexity decision making for assessment and support, frequent evaluation and titration of therapies, application of advanced monitoring technologies and extensive interpretation of multiple databases. Critical Care Time devoted to patient care services described in this note independent of APP/resident  time is 32 minutes.    Kara Mead MD. Shade Flood. [place] [facility] Pager [phone] If no response call [phone]   08/07/2018

## 2018-08-08 DIAGNOSIS — G92 Toxic encephalopathy: Secondary | ICD-10-CM

## 2018-08-08 DIAGNOSIS — J9621 Acute and chronic respiratory failure with hypoxia: Secondary | ICD-10-CM

## 2018-08-08 LAB — CBC
HCT: 30.9 % — ABNORMAL LOW (ref 39.0–52.0)
Hemoglobin: 8.9 g/dL — ABNORMAL LOW (ref 13.0–17.0)
MCH: 24.5 pg — AB (ref 26.0–34.0)
MCHC: 28.8 g/dL — ABNORMAL LOW (ref 30.0–36.0)
MCV: 84.9 fL (ref 80.0–100.0)
Platelets: 184 10*3/uL (ref 150–400)
RBC: 3.64 MIL/uL — ABNORMAL LOW (ref 4.22–5.81)
RDW: 19.9 % — ABNORMAL HIGH (ref 11.5–15.5)
WBC: 52.8 10*3/uL (ref 4.0–10.5)
nRBC: 2.1 % — ABNORMAL HIGH (ref 0.0–0.2)

## 2018-08-08 LAB — PROCALCITONIN: Procalcitonin: 1.25 ng/mL

## 2018-08-08 LAB — GLUCOSE, CAPILLARY
GLUCOSE-CAPILLARY: 129 mg/dL — AB (ref 70–99)
GLUCOSE-CAPILLARY: 118 mg/dL — AB (ref 70–99)
Glucose-Capillary: 99 mg/dL (ref 70–99)
Glucose-Capillary: 121 mg/dL — ABNORMAL HIGH (ref 70–99)
Glucose-Capillary: 115 mg/dL — ABNORMAL HIGH (ref 70–99)
Glucose-Capillary: 118 mg/dL — ABNORMAL HIGH (ref 70–99)

## 2018-08-08 LAB — VALPROIC ACID LEVEL: Valproic Acid Lvl: 48 ug/mL — ABNORMAL LOW (ref 50.0–100.0)

## 2018-08-08 LAB — BASIC METABOLIC PANEL WITH GFR
Anion gap: 14 (ref 5–15)
BUN: 126 mg/dL — ABNORMAL HIGH (ref 8–23)
CO2: 16 mmol/L — ABNORMAL LOW (ref 22–32)
Calcium: 9.7 mg/dL (ref 8.9–10.3)
Chloride: 124 mmol/L — ABNORMAL HIGH (ref 98–111)
Creatinine, Ser: 2.16 mg/dL — ABNORMAL HIGH (ref 0.61–1.24)
GFR calc Af Amer: 33 mL/min — ABNORMAL LOW
GFR calc non Af Amer: 29 mL/min — ABNORMAL LOW
Glucose, Bld: 102 mg/dL — ABNORMAL HIGH (ref 70–99)
Potassium: 3.3 mmol/L — ABNORMAL LOW (ref 3.5–5.1)
Sodium: 154 mmol/L — ABNORMAL HIGH (ref 135–145)

## 2018-08-08 LAB — MAGNESIUM: Magnesium: 2.3 mg/dL (ref 1.7–2.4)

## 2018-08-08 LAB — PHOSPHORUS: Phosphorus: 4.6 mg/dL (ref 2.5–4.6)

## 2018-08-08 MED ORDER — FREE WATER
400.0000 mL | Status: DC
  Administered 2018-08-08 – 2018-08-15 (×41): 400 mL

## 2018-08-08 MED ORDER — POTASSIUM CHLORIDE 20 MEQ/15ML (10%) PO SOLN
40.0000 meq | Freq: Once | ORAL | Status: AC
  Administered 2018-08-08: 40 meq
  Filled 2018-08-08: qty 30

## 2018-08-08 MED ORDER — VALPROIC ACID 250 MG/5ML PO SOLN
750.0000 mg | Freq: Three times a day (TID) | ORAL | Status: DC
  Administered 2018-08-08 – 2018-08-09 (×3): 750 mg
  Filled 2018-08-08 (×4): qty 15

## 2018-08-08 MED ORDER — SODIUM CHLORIDE 0.9 % IV SOLN
INTRAVENOUS | Status: DC
  Administered 2018-08-08 – 2018-08-19 (×5): via INTRAVENOUS

## 2018-08-08 NOTE — Progress Notes (Signed)
NAME:  Juan [name], MRN:  [id number], DOB:  20-Mar-1941, LOS: 55 ADMISSION DATE:  07/20/2018, CONSULTATION DATE:  3/13 REFERRING MD:  Lupita Leash, CHIEF COMPLAINT:  Acute encephalopathy and worsening mental status    Brief History   78 year old male patient admitted initially on 3/7 with sepsis secondary secondary to pseudomonas urinary tract infection with associated acute on chronic renal failure secondary to obstructive uropathy.  Over the course of his hospitalization developed progressive encephalopathy, with concern for nonconvulsive status on 3/12.  This was felt possibly secondary to neurotoxicity from cefepime.  Intubated for airway protection. He was initially under propofol, and transition to Versed due to severe bradycardia on 3/16, Versed was stopped 3/17  Past Medical History  Chronic urinary retention with prostate hypertrophy, prior Pseudomonas urinary tract infections, stage III chronic kidney disease, moderate to severe pulmonary hypertension, chronic diastolic heart failure, myelodysplasia with myelodysplastic syndrome, chronic back pain  Significant Hospital Events   3/7: Admitted with presumptive diagnosis of urinary tract infection 3/9: worsening renal failure, seen by urology, Foley catheter placed 3/12: Worsening mental status, EEG worrisome for possible nonconvulsive status.  Felt possibly due to neurotoxicity from cefepime this was discontinued and Zosyn was started.  lumbar puncture performed, for possible meningitis.  Infectious disease consulted, neurology consulted, started on Keppra, moved to [facility] for continuous EEG monitoring 3/13 remains unresponsive, developed worsening hypoxia staph from prior LP unremarkable without evidence of meningitis.  Pulmonary asked to evaluate 3/17 Episode of bradycardia overnight in spite of stopping propofol hence dopamine started. Episode of mucus plugging 3/17 versed stopped  Consults:   ID Neurology Urology PCCM  Procedures:  3/12  Fluoro-guided LP performed at L3-L4. Opening pressure 12 cm H20. 13.5 ml clear CSF obtained  Significant Diagnostic Tests:  CT abdomen pelvis 3/7 showed bilateral hydronephrosis with bladder wall thickening and multiple bladder diverticuli suggesting chronic bladder outlet obstruction with cystitis MRI brain 3/12: Truncated and motion degraded examination without visible acute abnormality.  MRI L spine 3/12:1. Mild foraminal narrowing bilaterally at L4-5 is worse on the left.2. Chronic endplate changes at I3-4 with associated Schmorl's node. 3. Minimal rightward disc bulging without significant stenosis.4. 10 mm solid lesion at the upper pole of the right kidney was not well seen on recent CT scans. Small renal cell carcinoma is not excluded. Recommend dedicated MRI of the kidneys as the patient's condition allows. 5. Stable cyst at the lower pole of the right kidney.  EEG 3/12 frequent periodic discharges concerning for triphasic versus epileptiform GPD's 3/17 EEG >>burst suppression, triphasics GPD's US abdomen 3/12: 1. No acute findings. 2. Single visualized gallstone. No evidence of acute cholecystitis. No other abnormality.  MRI brain 3/21 neg  Micro Data:  UC 3/7 pseudomonas (cipro resistant) Urine cx 3/12 >> pseudomonas BCx2 3/8>>> neg BCX2 3/12>>>neg CSF 3/12: neg resp culture 3/13>>>nml flora 3/16 urine >> pseudomonas resp 3/18 >> providencia, multidrug-resistant (S ceftriaxone)  Antimicrobials:  Cefepime 3/7>>3/12 Zosyn 3/12>>> 3/19 zyvox 3/13>>>3/15 ceftx 3/21 (HCAP ) >>  Interim history/subjective:  No sedation since versed was stopped on 3/17 Remains comatose, may have opened eyes w turning earlier today  Objective   Blood pressure 139/63, pulse (!) 120, temperature 99.5 F (37.5 C), temperature source Oral, resp. rate (!) 21, height 6\' 3"  (1.905 m), weight 89.6 kg, SpO2 96 %.    Vent Mode: PSV;CPAP FiO2 (%):  [30 %] 30 % Set Rate:  [16 bmp] 16 bmp Vt  Set:  [742 mL] 670 mL PEEP:  [5 cmH20] 5  cmH20 Pressure Support:  [5 cmH20] 5 cmH20 Plateau Pressure:  [10 cmH20-16 cmH20] 10 cmH20   Intake/Output Summary (Last 24 hours) at 08/08/2018 1121 Last data filed at 08/08/2018 1100 Gross per 24 hour  Intake 2690.07 ml  Output 2145 ml  Net 545.07 ml   Filed Weights   08/06/18 0416 08/07/18 0246 08/08/18 0500  Weight: 92 kg 89.9 kg 89.6 kg   Examination: Ill appearing man, intubated Comatose, may have tried to open eyes with sternal rub. corneals intact, cough and gag intact Op clear, ETT in place, pupils equal, no icterus  Coarse bilaterally, no crackles, no wheezes Regular, no murmur Abdomen soft, nondistended, positive bowel sounds 2+ lower extremity edema No rash   Resolved Hospital Problem list   Transient bradycardia-due to propofol, resolved  Assessment & Plan:   Acute metabolic encephalopathy.  Nonconvulsive status 2/2 neurotoxicity from?cefepime Underwent deep sedation with Versed to initiate burst suppression.  Remains comatose, has been off Versed since 3/17 Plan Remains on Vimpat, Keppra, Depakote All other sedating medications held, no Versed since 3/17 Appreciate neurology input.  He may require repeat EEG.  Neurologic prognosis concerning given his failure to wake up. Continue Depakote, Keppra and Vimpat Possible contribution from his hyponatremia, need to correct with free water   Acute hypoxic respiratory failure 2/2 evolving pulmonary infiltrates and atx super imposed on underlying severe PH and OSA  intermittent episodes of mucous plugging reported Plan Tolerates spontaneous breathing trials but does not have the mental status for a safe extubation. Hypertonic saline nebs ordered. Optimize volume status Sedating medications held as above  Hypertension- Scheduled hydralazine  Acute on chronic renal failure (st III CKD) 2/2 chronic urinary retention w/ h/o poorly functioning bladder, enlarged prostate and  mild to mod obstruction. Has had multiple recurrent UTIs. Has not completed treatment in past d/t financial restraints.  Hyperchloremia amd Non-anion gap metabolic acidosis, resolved Solid lesion on upper pole of right kidney.  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 - Not been treated due to poor performance status, last oncology visit 06/10/2018 CT abdomen imaging also noting innumerable lytic lesions involving the spine Leukocytosis- baseline 22k range Plan Follow CBC, interpretation complicated  Iron def anemia Plan Transfuse for hemoglobin 7 or lower   Best practice:  Diet: NPO, TF Pain/Anxiety/Delirium protocol (if indicated): 3/13 VAP protocol (if indicated): 3/13 DVT prophylaxis: [place] heparin  GI prophylaxis: H2B Glucose control: ssi Mobilitity BR Code Status: full code  Family Communication: None present at bedside 3/23 Disposition: ICU  The patient is critically ill with multiple organ systems failure and requires high complexity decision making for assessment and support, frequent evaluation and titration of therapies, application of advanced monitoring technologies and extensive interpretation of multiple databases.    Independent critical care time 35 minutes  Baltazar Apo, MD, PhD 08/08/2018, 11:44 AM [place] [facility] [phone] or if no answer [phone]

## 2018-08-08 NOTE — Progress Notes (Signed)
Neruo Pt is not unresponsive, but grimaces to painful stimuli, i.e. turning. Pt does open eyes but not to any command.

## 2018-08-08 NOTE — Progress Notes (Signed)
Neurology Progress Note   S:// Seen and examined. No changes overnight. No acute events overnight. Patient not able to provide history.   O:// Current vital signs: BP 134/65   Pulse (!) 112   Temp 98.9 F (37.2 C) (Oral)   Resp 18   Ht '6\' 3"'$  (1.905 m)   Wt 89.6 kg   SpO2 99%   BMI 24.69 kg/m  Vital signs in last 24 hours: Temp:  [97.4 F (36.3 C)-99.2 F (37.3 C)] 98.9 F (37.2 C) (03/23 0700) Pulse Rate:  [100-115] 112 (03/23 0739) Resp:  [16-20] 18 (03/23 0739) BP: (121-177)/(51-76) 134/65 (03/23 0739) SpO2:  [96 %-100 %] 99 % (03/23 0739) FiO2 (%):  [30 %] 30 % (03/23 0739) Weight:  [89.6 kg] 89.6 kg (03/23 0500) Gen: intubated, no sedation Resp: ventilated, breathing over Abd: soft ND NT Neurological MS: eyes close, no response to nox stim Non verbal, not following commadns CN: PERRL, +dolls eyes, +corneals, no blink to threat. Gag and cough intact Motor: no response to nox stim Sensory as above Gait, coordination cannot be tested due to mentation.  Medications  Current Facility-Administered Medications:  .  [DISCONTINUED] acetaminophen (TYLENOL) tablet 650 mg, 650 mg, Oral, Q6H PRN, 650 mg at 07/24/18 1539 **OR** acetaminophen (TYLENOL) suppository 650 mg, 650 mg, Rectal, Q6H PRN, Erick Colace, NP .  albuterol (PROVENTIL) (2.5 MG/3ML) 0.083% nebulizer solution 2.5 mg, 2.5 mg, Nebulization, Q6H PRN, Rigoberto Noel, MD, 2.5 mg at 08/05/18 2009 .  cefTRIAXone (ROCEPHIN) 1 g in sodium chloride 0.9 % 100 mL IVPB, 1 g, Intravenous, Q24H, Rigoberto Noel, MD, Stopped at 08/07/18 [phone]  chlorhexidine gluconate (MEDLINE KIT) (PERIDEX) 0.12 % solution 15 mL, 15 mL, Mouth Rinse, BID, Salvadore Dom E, NP, 15 mL at 08/08/18 0840 .  Chlorhexidine Gluconate Cloth 2 % PADS 6 each, 6 each, Topical, Daily, Rush Farmer, MD, 6 each at 08/07/18 0911 .  clotrimazole (LOTRIMIN) 1 % cream 1 application, 1 application, Topical, BID, Erick Colace, NP, 1 application at  85/46/27 2142 .  famotidine (PEPCID) 40 MG/5ML suspension 20 mg, 20 mg, Per Tube, Daily, Rush Farmer, MD, 20 mg at 08/07/18 0910 .  feeding supplement (PRO-STAT SUGAR FREE 64) liquid 30 mL, 30 mL, Per Tube, BID, Rigoberto Noel, MD, 30 mL at 08/07/18 2137 .  feeding supplement (VITAL AF 1.2 CAL) liquid 1,000 mL, 1,000 mL, Per Tube, Continuous, Rigoberto Noel, MD, Last Rate: 60 mL/hr at 08/07/18 2000 .  fentaNYL (SUBLIMAZE) injection 50 mcg, 50 mcg, Intravenous, Q15 min PRN, Erick Colace, NP .  fentaNYL (SUBLIMAZE) injection 50 mcg, 50 mcg, Intravenous, Q2H PRN, Erick Colace, NP, 50 mcg at 07/31/18 2051 .  free water 200 mL, 200 mL, Per Tube, Q4H, Kara Mead V, MD, 200 mL at 08/08/18 0538 .  heparin injection 5,000 Units, 5,000 Units, Subcutaneous, Q8H, Erick Colace, NP, 5,000 Units at 08/08/18 [phone] .  hydrALAZINE (APRESOLINE) injection 10 mg, 10 mg, Intravenous, Q6H PRN, Eliezer Bottom T, MD, 10 mg at 08/05/18 1227 .  hydrALAZINE (APRESOLINE) tablet 75 mg, 75 mg, Oral, TID, Rigoberto Noel, MD, 75 mg at 08/07/18 2138 .  hydrocortisone cream 1 % 1 application, 1 application, Topical, BID, Erick Colace, NP, 1 application at 09/38/18 2142 .  lacosamide (VIMPAT) tablet 200 mg, 200 mg, Per Tube, BID, Rush Farmer, MD, 200 mg at 08/07/18 2138 .  levETIRAcetam (KEPPRA) 100 MG/ML solution 500 mg, 500 mg, Per  Tube, BID, Rigoberto Noel, MD, 500 mg at 08/07/18 2137 .  LORazepam (ATIVAN) injection 1 mg, 1 mg, Intravenous, Q4H PRN, Erick Colace, NP .  MEDLINE mouth rinse, 15 mL, Mouth Rinse, 10 times per day, Erick Colace, NP, 15 mL at 08/08/18 0549 .  [DISCONTINUED] ondansetron (ZOFRAN) tablet 4 mg, 4 mg, Oral, Q6H PRN **OR** ondansetron (ZOFRAN) injection 4 mg, 4 mg, Intravenous, Q6H PRN, Erick Colace, NP, 4 mg at 07/24/18 2202 .  sodium chloride flush (NS) 0.9 % injection 10-40 mL, 10-40 mL, Intracatheter, Q12H, Rush Farmer, MD, 10 mL at 08/07/18 0910 .  sodium chloride  flush (NS) 0.9 % injection 10-40 mL, 10-40 mL, Intracatheter, PRN, Rush Farmer, MD .  valproic acid (DEPAKENE) solution 750 mg, 750 mg, Per Tube, Q6H, Rush Farmer, MD, 750 mg at 08/08/18 0637   Labs CBC    Component Value Date/Time   WBC 52.8 (HH) 08/08/2018 0530   RBC 3.64 (L) 08/08/2018 0530   HGB 8.9 (L) 08/08/2018 0530   HGB 9.7 (L) 06/10/2018 1001   HGB 10.7 (L) 03/30/2017 1420   HGB 9.8 (L) 11/16/2013 0847   HCT 30.9 (L) 08/08/2018 0530   HCT 34.1 (L) 03/30/2017 1420   HCT 30.7 (L) 11/16/2013 0847   PLT 184 08/08/2018 0530   PLT 131 (L) 06/10/2018 1001   PLT 287 Large platelets present 03/30/2017 1420   PLT 323 11/16/2013 0847   MCV 84.9 08/08/2018 0530   MCV 86 03/30/2017 1420   MCV 80.3 11/16/2013 0847   MCH 24.5 (L) 08/08/2018 0530   MCHC 28.8 (L) 08/08/2018 0530   RDW 19.9 (H) 08/08/2018 0530   RDW 18.1 (H) 03/30/2017 1420   RDW 20.0 (H) 11/16/2013 0847   LYMPHSABS 2.1 07/29/2018 0933   LYMPHSABS 1.4 03/30/2017 1420   MONOABS 6.8 (H) 07/29/2018 0933   EOSABS 0.7 (H) 07/29/2018 0933   EOSABS 0.2 03/30/2017 1420   BASOSABS 0.9 (H) 07/29/2018 0933   BASOSABS 0.4 (H) 03/30/2017 1420    CMP     Component Value Date/Time   NA 154 (H) 08/08/2018 0530   NA 142 02/28/2018 1202   NA 143 03/30/2017 1420   NA 138 11/16/2013 0847   K 3.3 (L) 08/08/2018 0530   K 4.3 03/30/2017 1420   K 4.5 11/16/2013 0847   CL 124 (H) 08/08/2018 0530   CL 110 (H) 03/30/2017 1420   CO2 16 (L) 08/08/2018 0530   CO2 27 03/30/2017 1420   CO2 22 11/16/2013 0847   GLUCOSE 102 (H) 08/08/2018 0530   GLUCOSE 106 03/30/2017 1420   BUN 126 (H) 08/08/2018 0530   BUN 68 (H) 02/28/2018 1202   BUN 38 (H) 03/30/2017 1420   BUN 20.2 11/16/2013 0847   CREATININE 2.16 (H) 08/08/2018 0530   CREATININE 1.41 (H) 06/10/2018 1001   CREATININE 1.3 (H) 03/30/2017 1420   CREATININE 1.2 11/16/2013 0847   CALCIUM 9.7 08/08/2018 0530   CALCIUM 9.8 03/30/2017 1420   CALCIUM 9.8 11/16/2013 0847    PROT 5.6 (L) 08/04/2018 0426   PROT 6.0 (L) 03/30/2017 1420   PROT 6.7 11/16/2013 0847   ALBUMIN 1.6 (L) 08/04/2018 0426   ALBUMIN 2.8 (L) 03/30/2017 1420   ALBUMIN 3.4 (L) 11/16/2013 0847   AST 15 08/04/2018 0426   AST 26 06/10/2018 1001   AST 25 11/16/2013 0847   ALT 10 08/04/2018 0426   ALT 28 06/10/2018 1001   ALT 22 03/30/2017 1420  ALT 11 11/16/2013 0847   ALKPHOS 86 08/04/2018 0426   ALKPHOS 116 (H) 03/30/2017 1420   ALKPHOS 125 11/16/2013 0847   BILITOT 0.4 08/04/2018 0426   BILITOT 0.3 06/10/2018 1001   BILITOT 0.35 11/16/2013 0847   GFRNONAA 29 (L) 08/08/2018 0530   GFRNONAA 48 (L) 06/10/2018 1001   GFRAA 33 (L) 08/08/2018 0530   GFRAA 56 (L) 06/10/2018 1001   WBC count 52.4, hemoglobin 8.9, platelet count 184,000, sodium 154, potassium 3.3, creatinine 2.16, BUN 126 Depakote level 08/08/2018 - 48 Last ammonia level on 08/04/2018 - 34 TSH 4.65  Imaging I have reviewed images in epic and the results pertinent to this consultation are: MRI brain 07/28/2018 (truncated exam) and 08/06/2018 with no acute changes.  Assessment: 78 year old man, with a past medical history of myeloproliferative disease, CHF, coronary artery disease, sleep apnea and recurrent UTIs, admitted for treatment of Pseudomonas UTI developed encephalopathy with bifrontal triphasic appearing discharges concerning for possible status epilepticus with some response Ativan and then some left arm jerking associated with discharges on Friday, 07/29/2018-aggressively treated as status epilepticus with Versed from 3/13-3/15 titrated to seizure suppression subsequently followed by propofol in an attempt to achieve burst suppression which is not obtained until the evening of 08/01/2018 and then stopped after 8 hours of birth suppression due to bradycardia and started back on Versed which was eventually weaned off on 08/02/2018. Continues to be severely encephalopathic. Evaluation and assessment by Dr. Leonel Ramsay  consistent with possible cefepime toxicity in the setting of renal dysfunction along with inability to clear versed (prolonged use and deranged renal function limiting clearance). Along with this, in the setting of low albumin, Depakote (free level elevated) might be contributing to encephalopathy.  Impression: Multi factorial toxic metabolic encephalopathy Possible status epilepticus - resolved   Recommendations: Keppra renal dose Depakote still >30. Decrease to 750 q8h. Check daily levels Continue Vimpat 100 BID Consider repeat spot EEG if exam does not improve in the next few days. No need to repeat imaging for now - already has 2 MRIs, with no evidence of acute process.  We will follow.  -- Amie Portland, MD [facility] Pager: [phone] If 7pm to 7am, please call on call as listed on AMION.

## 2018-08-09 ENCOUNTER — Inpatient Hospital Stay (HOSPITAL_COMMUNITY): Payer: Medicare Other

## 2018-08-09 LAB — GLUCOSE, CAPILLARY
GLUCOSE-CAPILLARY: 107 mg/dL — AB (ref 70–99)
Glucose-Capillary: 103 mg/dL — ABNORMAL HIGH (ref 70–99)
Glucose-Capillary: 106 mg/dL — ABNORMAL HIGH (ref 70–99)
Glucose-Capillary: 116 mg/dL — ABNORMAL HIGH (ref 70–99)
Glucose-Capillary: 117 mg/dL — ABNORMAL HIGH (ref 70–99)
Glucose-Capillary: 117 mg/dL — ABNORMAL HIGH (ref 70–99)

## 2018-08-09 LAB — BASIC METABOLIC PANEL
Anion gap: 12 (ref 5–15)
BUN: 128 mg/dL — ABNORMAL HIGH (ref 8–23)
CO2: 16 mmol/L — ABNORMAL LOW (ref 22–32)
Calcium: 9.8 mg/dL (ref 8.9–10.3)
Chloride: 125 mmol/L — ABNORMAL HIGH (ref 98–111)
Creatinine, Ser: 2.07 mg/dL — ABNORMAL HIGH (ref 0.61–1.24)
GFR calc Af Amer: 35 mL/min — ABNORMAL LOW (ref >60)
GFR calc non Af Amer: 30 mL/min — ABNORMAL LOW (ref >60)
Glucose, Bld: 124 mg/dL — ABNORMAL HIGH (ref 70–99)
Potassium: 3.6 mmol/L (ref 3.5–5.1)
Sodium: 153 mmol/L — ABNORMAL HIGH (ref 135–145)

## 2018-08-09 LAB — CBC
HEMATOCRIT: 25.3 % — AB (ref 39.0–52.0)
Hemoglobin: 7.3 g/dL — ABNORMAL LOW (ref 13.0–17.0)
MCH: 24.7 pg — ABNORMAL LOW (ref 26.0–34.0)
MCHC: 28.9 g/dL — ABNORMAL LOW (ref 30.0–36.0)
MCV: 85.5 fL (ref 80.0–100.0)
Platelets: 240 10*3/uL (ref 150–400)
RBC: 2.96 MIL/uL — ABNORMAL LOW (ref 4.22–5.81)
RDW: 20.1 % — ABNORMAL HIGH (ref 11.5–15.5)
WBC: 63.9 10*3/uL (ref 4.0–10.5)
nRBC: 2.9 % — ABNORMAL HIGH (ref 0.0–0.2)

## 2018-08-09 LAB — VALPROIC ACID LEVEL: Valproic Acid Lvl: 43 ug/mL — ABNORMAL LOW (ref 50.0–100.0)

## 2018-08-09 LAB — MAGNESIUM: Magnesium: 2.4 mg/dL (ref 1.7–2.4)

## 2018-08-09 MED ORDER — VALPROIC ACID 250 MG/5ML PO SOLN
750.0000 mg | Freq: Two times a day (BID) | ORAL | Status: DC
  Administered 2018-08-09 – 2018-08-14 (×10): 750 mg
  Filled 2018-08-09 (×12): qty 15

## 2018-08-09 MED ORDER — ACETAMINOPHEN 160 MG/5ML PO SOLN
650.0000 mg | Freq: Four times a day (QID) | ORAL | Status: DC | PRN
  Administered 2018-08-09 – 2018-08-19 (×11): 650 mg
  Filled 2018-08-09 (×11): qty 20.3

## 2018-08-09 MED ORDER — SODIUM CHLORIDE 0.9 % IV SOLN
2.0000 g | INTRAVENOUS | Status: AC
  Administered 2018-08-10 – 2018-08-12 (×3): 2 g via INTRAVENOUS
  Filled 2018-08-09 (×3): qty 20

## 2018-08-09 MED ORDER — POTASSIUM CHLORIDE 20 MEQ/15ML (10%) PO SOLN
40.0000 meq | Freq: Once | ORAL | Status: AC
  Administered 2018-08-09: 40 meq via ORAL
  Filled 2018-08-09: qty 30

## 2018-08-09 MED ORDER — DEXTROSE 5 % IV SOLN
INTRAVENOUS | Status: DC
  Administered 2018-08-09: 10 mL/h via INTRAVENOUS
  Administered 2018-08-11 – 2018-08-12 (×2): via INTRAVENOUS

## 2018-08-09 NOTE — Progress Notes (Addendum)
NAME:  Juan [name], MRN:  [id number], DOB:  [birth year], LOS: 72 ADMISSION DATE:  08/02/2018, CONSULTATION DATE:  3/13 REFERRING MD:  Lupita Leash, CHIEF COMPLAINT:  Acute encephalopathy and worsening mental status    Brief History   78 year old male patient admitted initially on 3/7 with sepsis secondary secondary to pseudomonas urinary tract infection with associated acute on chronic renal failure secondary to obstructive uropathy.  Over the course of his hospitalization developed progressive encephalopathy, with concern for nonconvulsive status on 3/12.  This was felt possibly secondary to neurotoxicity from cefepime.  Intubated for airway protection. He was initially under propofol, and transition to Versed due to severe bradycardia on 3/16, Versed was stopped 3/17  Past Medical History  Chronic urinary retention with prostate hypertrophy, prior Pseudomonas urinary tract infections, stage III chronic kidney disease, moderate to severe pulmonary hypertension, chronic diastolic heart failure, myelodysplasia with myelodysplastic syndrome, chronic back pain  Significant Hospital Events   3/7: Admitted with presumptive diagnosis of urinary tract infection 3/9: worsening renal failure, seen by urology, Foley catheter placed 3/12: Worsening mental status, EEG worrisome for possible nonconvulsive status.  Felt possibly due to neurotoxicity from cefepime this was discontinued and Zosyn was started.  lumbar puncture performed, for possible meningitis.  Infectious disease consulted, neurology consulted, started on Keppra, moved to [facility] for continuous EEG monitoring 3/13 remains unresponsive, developed worsening hypoxia staph from prior LP unremarkable without evidence of meningitis.  Pulmonary asked to evaluate 3/17 Episode of bradycardia overnight in spite of stopping propofol hence dopamine started. Episode of mucus plugging 3/17 versed stopped 3/24>> No improvement in neuro status despite off sedation s   Consults:   ID Neurology Urology PCCM  Procedures:  3/12 Fluoro-guided LP performed at L3-L4. Opening pressure 12 cm H20. 13.5 ml clear CSF obtained  Significant Diagnostic Tests:  CT abdomen pelvis 3/7 showed bilateral hydronephrosis with bladder wall thickening and multiple bladder diverticuli suggesting chronic bladder outlet obstruction with cystitis MRI brain 3/12: Truncated and motion degraded examination without visible acute abnormality.  MRI L spine 3/12:1. Mild foraminal narrowing bilaterally at L4-5 is worse on the left.2. Chronic endplate changes at O8-4 with associated Schmorl's node. 3. Minimal rightward disc bulging without significant stenosis.4. 10 mm solid lesion at the upper pole of the right kidney was not well seen on recent CT scans. Small renal cell carcinoma is not excluded. Recommend dedicated MRI of the kidneys as the patient's condition allows. 5. Stable cyst at the lower pole of the right kidney.  EEG 3/12 frequent periodic discharges concerning for triphasic versus epileptiform GPD's 3/17 EEG >>burst suppression, triphasics GPD's US abdomen 3/12: 1. No acute findings. 2. Single visualized gallstone. No evidence of acute cholecystitis. No other abnormality.  MRI brain 3/21 neg  Micro Data:  UC 3/7 pseudomonas (cipro resistant) Urine cx 3/12 >> pseudomonas BCx2 3/8>>> neg BCX2 3/12>>>neg CSF 3/12: neg resp culture 3/13>>>nml flora 3/16 urine >> pseudomonas resp 3/18 >> providencia, multidrug-resistant (S ceftriaxone)  Antimicrobials:  Cefepime 3/7>>3/12 Zosyn 3/12>>> 3/19 zyvox 3/13>>>3/15 ceftx 3/21 (HCAP ) >>  Interim history/subjective:  No sedation since versed was stopped on 1/66 May flicker eyes to deep sternal rub, otherwise comatose  Objective   Blood pressure 127/65, pulse (!) 103, temperature (!) 100.7 F (38.2 C), resp. rate 17, height 6\' 3"  (1.905 m), weight 90.8 kg, SpO2 97 %.    Vent Mode: PSV;CPAP FiO2 (%):  [30 %] 30  % Set Rate:  [16 bmp] 16 bmp Vt Set:  [063 mL] 670  mL PEEP:  [5 cmH20] 5 cmH20 Pressure Support:  [5 cmH20-10 cmH20] 10 cmH20 Plateau Pressure:  [11 cmH20-19 cmH20] 15 cmH20   Intake/Output Summary (Last 24 hours) at 08/09/2018 1038 Last data filed at 08/09/2018 1000 Gross per 24 hour  Intake 4409.02 ml  Output 1565 ml  Net 2844.02 ml   Filed Weights   08/07/18 0246 08/08/18 0500 08/09/18 0200  Weight: 89.9 kg 89.6 kg 90.8 kg   Examination: General: Ill appearing man, intubated, off all sedation Neuro:Comatose, ?  Slight eye opening  with sternal rub. corneals intact, cough and gag intact Op clear, ETT in place, OG tube in place,  pupils equal, no icterus  Bilateral chest excursion, Coarse bilaterally, no crackles, no wheezes, diminished per bases S1, S2, RRR, No RMG Abdomen soft, nondistended, positive bowel sounds, tolerating TF 2+ lower extremity edema, brisk cap refill No rash, lesions   Resolved Hospital Problem list   Transient bradycardia-due to propofol, resolved  Assessment & Plan:   Acute metabolic encephalopathy.  Nonconvulsive status 2/2 neurotoxicity from?cefepime Underwent deep sedation with Versed to initiate burst suppression.  Remains comatose, has been off Versed since 3/17 Plan Remains on Vimpat, Keppra, Depakote All other sedating medications held, no Versed since 3/17 Appreciate neurology input.  He may require repeat EEG.  Neurologic prognosis concerning given his failure to wake up. Continue Depakote, Keppra and Vimpat Possible contribution from his hypernatremia, need to correct with free water( 400 cc Q 4) Consider repeat imaging Consider palliation discussions with family   Acute hypoxic respiratory failure 2/2 evolving pulmonary infiltrates and atx super imposed on underlying severe PH and OSA  intermittent episodes of mucous plugging reported Plan Tolerates spontaneous breathing trials 3/23 x 12 hours Failed 3/24 Neuro status is  current  barrier to extubation Continue Hypertonic saline nebs  Optimize volume status ( + 13 L since admission, + 2.5 L last 24 Diuresis as renal function allows Sedating medications held as above Consider trach, Day 10 ETT  Hypertension- Scheduled hydralazine  Acute on chronic renal failure (st III CKD) 2/2 chronic urinary retention w/ h/o poorly functioning bladder, enlarged prostate and mild to mod obstruction. Has had multiple recurrent UTIs. Has not completed treatment in past d/t financial restraints.  Hyperchloremia amd Non-anion gap metabolic acidosis, resolved Solid lesion on upper pole of right kidney.  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 - Not been treated due to poor performance status, last oncology visit 06/10/2018 CT abdomen imaging also noting innumerable lytic lesions involving the spine Leukocytosis- baseline 22k range Plan Follow CBC, interpretation complicated May be related to progression of his untreated disease Consider checking circulating blasts ( anemia, leukocytosis with circulating blasts between 5 and 14% due to undiagnosed CAL-R pos ET 2015.)   Iron def anemia HGB drop of 1.5 grams last 24 Plan Transfuse for hemoglobin 7 or lower Monitor for obvious source of bleeding Consider [name] for HGB of < 8  Palliation discussions  Need to be considered  to align plan of care with clear goals of care in light of  continued poor neurological  status, and possible worsening of 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    Day 10 ETT    Best practice:  Diet: NPO, TF Pain/Anxiety/Delirium protocol (if indicated): 3/13 VAP protocol (if indicated): 3/13 DVT prophylaxis: Clio heparin  GI prophylaxis: H2B Glucose control: ssi Mobilitity BR Code Status: full code  Family Communication: No family at bedside, no family calls for updates. Disposition: ICU   Independent critical care time 36 minutes  Magdalen Spatz, AGACNP-BC 08/09/2018, 10:38 AM [facility] [phone] or if no answer [phone]

## 2018-08-09 NOTE — Progress Notes (Addendum)
NEUROLOGY PROGRESS NOTE  Subjective: Seen and examined.  Patient now opening eyes to noxious stimuli.  No acute events overnight.  Patient still unable to provide history.  Exam: Vitals:   08/09/18 0735 08/09/18 0753  BP:  113/61  Pulse:  (!) 118  Resp:  20  Temp: (!) 100.7 F (38.2 C)   SpO2:  97%    Physical Exam  HEENT-  Normocephalic, no lesions, without obvious abnormality.  Normal external eye and conjunctiva.  Ventilated-breathing over the vent Extremities- Warm, dry and intact Musculoskeletal-no joint tenderness, deformity or swelling Skin-warm and dry, edematous Neuro:  Mental Status: Intubated and ventilated.  Patient does not respond to verbal stimuli.  Does not follow commands.  Opens eyes to noxious stimuli. Cranial Nerves: II: No blink to threat III,IV, VI: Doll's, pupillary, corneal reflex intact V,VII: smile symmetric, VIII: No response Motor: No response to noxious stimuli Sensory: Opens eyes to sternal rub  Medications:  Scheduled: . chlorhexidine gluconate (MEDLINE KIT)  15 mL Mouth Rinse BID  . Chlorhexidine Gluconate Cloth  6 each Topical Daily  . clotrimazole  1 application Topical BID  . famotidine  20 mg Per Tube Daily  . feeding supplement (PRO-STAT SUGAR FREE 64)  30 mL Per Tube BID  . free water  400 mL Per Tube Q4H  . heparin  5,000 Units Subcutaneous Q8H  . hydrALAZINE  75 mg Oral TID  . hydrocortisone cream  1 application Topical BID  . lacosamide  200 mg Per Tube BID  . levETIRAcetam  500 mg Per Tube BID  . mouth rinse  15 mL Mouth Rinse 10 times per day  . sodium chloride flush  10-40 mL Intracatheter Q12H  . valproic acid  750 mg Per Tube Q12H    Pertinent Labs/Diagnostics: -Sodium 153 -Chloride 125 -BUN 128 -Creatinine 2.07 -WBC 63.9 -Valproic acid 43  Dg Chest Port 1 View  Result Date: 08/09/2018 CLINICAL DATA:  Acute on chronic respiratory failure EXAM: PORTABLE CHEST 1 VIEW COMPARISON:  08/07/2018 FINDINGS: Endotracheal  tube in good position. Right arm PICC tip in the SVC unchanged. Bibasilar airspace disease unchanged. Negative for vascular congestion or effusion. IMPRESSION: Endotracheal tube in good position. Bibasilar airspace disease unchanged. Possible pneumonia. Electronically Signed   By: Franchot Gallo M.D.   On: 08/09/2018 07:04     Etta Quill PA-C [facility] [phone]   Assessment: 78 year old male, with past medical history of myeloproliferative disease, CHF, coronary artery disease, sleep apnea and recurrent UTIs.  He was admitted for treatment of Pseudomonas UTI developed encephalopathy and bifrontal triphasic appearing discharges concerning for possible status epilepticus with some response to Ativan and then some left arm jerking associated with discharges on Friday, 07/29/2018-aggressively treated as status epilepticus with Versed from 3/13-3/15 titrated to seizure suppression subsequently followed by propofol in an attempt to achieve burst suppression which is not obtained until evening of 08/01/2018 and then stopped after 8 hours of burst suppression due to bradycardia and then started back on Versed which was eventually weaned off on 08/02/2018. Continues to be sedated and encephalopathic. Dr. Leonel Ramsay assessed and felt consistent with possible cefepime toxicity in the setting of renal dysfunction along with inability to clear Versed(prolonged use and derangement renal function limiting clearance).  Along with this, in the setting of low albumin, Depakote (Free level elevated) which might be contributing to the encephalopathy  Impression:  Multifactorial toxic metabolic encephalopathy Possible status epilepticus which has resolved  Recommendations: Keppra renal dose We will decrease Depakote  to 750 every 12 hours and daily level checks. Goal VPA level <30. Continue Vimpat 100 mg twice daily  08/09/2018, 8:37 AM  Attending Neurohospitalist Addendum Patient seen and  examined with APP/Resident. Agree with the history and physical as documented above. Agree with the plan as documented, which I helped formulate. I have independently reviewed the chart, obtained history, review of systems and examined the patient.I have personally reviewed pertinent head/neck/spine imaging (CT/MRI). Please feel free to call with any questions. --- Amie Portland, MD [facility] Pager: [phone]  If 7pm to 7am, please call on call as listed on AMION.

## 2018-08-09 NOTE — Progress Notes (Signed)
Nutrition Follow-up RD working remotely.  DOCUMENTATION CODES:   Not applicable  INTERVENTION:   Continue TF and free water flushes via OGT:  Vital AF 1.2 at 60 ml/h  Pro-stat 30 ml BID  Free water 400 ml every 4 hours  Provides 1928 kcals, 138 gm protein, 3568 ml free water total per day  NUTRITION DIAGNOSIS:   Inadequate oral intake related to inability to eat as evidenced by NPO status.  Ongoing   GOAL:   Patient will meet greater than or equal to 90% of their needs  Met with TF  MONITOR:   Vent status, Labs, Skin, I & O's  ASSESSMENT:   78 yo male with PMH of HTN, CAD, DM-2, myeloproliferative disease, CHF, iron deficiency, CKD who was admitted on 3/8 with complicated UTI. Transferred to the ICU and intubated on 3/13 for acute encephalopathy and worsening mental status.  Patient remains intubated on ventilator support. Currently off all sedation. Neuro status is the barrier to extubation. MV: 13.7 L/min Temp (24hrs), Avg:99.7 F (37.6 C), Min:98.4 F (36.9 C), Max:101.3 F (38.5 C)   Labs reviewed. Sodium 153 (H), BUN 128 (H), creatinine 2.07 (H) CBG's: [phone] Medications reviewed and include pepcid, free water 400 ml every 4 hours.  Patient is currently receiving Vital AF 1.2 via OGT at 60 ml/h (1440 ml/day) with Prostat 30 ml BID to provide 1928 kcals, 138 gm protein, 1168 ml free water daily.  Tolerating TF without difficulty.  I/O +13.5 L since admission Weight is 8 kg above admission weight  Diet Order:   Diet Order            Diet NPO time specified  Diet effective now              EDUCATION NEEDS:   No education needs have been identified at this time  Skin:  Skin Assessment: Skin Integrity Issues: Skin Integrity Issues:: Stage II Stage I: N/A Stage II: R ear  Last BM:  3/24 (type 7)  Height:   Ht Readings from Last 1 Encounters:  07/29/18 _0  (1.905 m)    Weight:   Wt Readings from Last 1 Encounters:  08/09/18  90.8 kg   Admit weight 82.7 kg  Ideal Body Weight:  89.1 kg  BMI:  Body mass index is 25.02 kg/m.  Estimated Nutritional Needs:   Kcal:  1975  Protein:  120-140 gm  Fluid:  2 L    Molli Barrows, RD, LDN, [place] Pager [phone] After Hours Pager [phone]

## 2018-08-09 NOTE — Progress Notes (Signed)
eLink Physician-Brief Progress Note Patient Name: Juan [name] DOB: 1940-10-01 MRN: [id number]   Date of Service  08/09/2018  HPI/Events of Note  Fever to 101.3 F. AST and ALT both normal. Request to change current Tylenol order from PR to per tube.   eICU Interventions  Will change Tylenol route to per tube.      Intervention Category Major Interventions: Other:  Lysle Dingwall 08/09/2018, 5:39 AM

## 2018-08-10 ENCOUNTER — Inpatient Hospital Stay (HOSPITAL_COMMUNITY): Payer: Medicare Other

## 2018-08-10 LAB — BASIC METABOLIC PANEL
Anion gap: 9 (ref 5–15)
BUN: 124 mg/dL — ABNORMAL HIGH (ref 8–23)
CO2: 17 mmol/L — ABNORMAL LOW (ref 22–32)
Calcium: 9.7 mg/dL (ref 8.9–10.3)
Chloride: 127 mmol/L — ABNORMAL HIGH (ref 98–111)
Creatinine, Ser: 2.02 mg/dL — ABNORMAL HIGH (ref 0.61–1.24)
GFR calc non Af Amer: 31 mL/min — ABNORMAL LOW (ref >60)
GFR, EST AFRICAN AMERICAN: 36 mL/min — AB (ref >60)
Glucose, Bld: 127 mg/dL — ABNORMAL HIGH (ref 70–99)
Potassium: 4 mmol/L (ref 3.5–5.1)
Sodium: 153 mmol/L — ABNORMAL HIGH (ref 135–145)

## 2018-08-10 LAB — CBC WITH DIFFERENTIAL/PLATELET
Basophils Absolute: 0.5 10*3/uL — ABNORMAL HIGH (ref 0.0–0.1)
Basophils Relative: 1 %
Eosinophils Absolute: 0.2 10*3/uL (ref 0.0–0.5)
Eosinophils Relative: 0 %
HCT: 23.9 % — ABNORMAL LOW (ref 39.0–52.0)
Hemoglobin: 6.7 g/dL — CL (ref 13.0–17.0)
Lymphocytes Relative: 5 %
Lymphs Abs: 2.6 10*3/uL (ref 0.7–4.0)
MCH: 23.9 pg — ABNORMAL LOW (ref 26.0–34.0)
MCHC: 28 g/dL — ABNORMAL LOW (ref 30.0–36.0)
MCV: 85.4 fL (ref 80.0–100.0)
MONOS PCT: 32 %
Monocytes Absolute: 17.9 10*3/uL — ABNORMAL HIGH (ref 0.1–1.0)
Neutro Abs: 24.2 10*3/uL — ABNORMAL HIGH (ref 1.7–7.7)
Neutrophils Relative %: 43 %
Platelets: 244 10*3/uL (ref 150–400)
RBC: 2.8 MIL/uL — ABNORMAL LOW (ref 4.22–5.81)
RDW: 20 % — ABNORMAL HIGH (ref 11.5–15.5)
WBC: 56 10*3/uL (ref 4.0–10.5)

## 2018-08-10 LAB — GLUCOSE, CAPILLARY
Glucose-Capillary: 106 mg/dL — ABNORMAL HIGH (ref 70–99)
Glucose-Capillary: 113 mg/dL — ABNORMAL HIGH (ref 70–99)
Glucose-Capillary: 100 mg/dL — ABNORMAL HIGH (ref 70–99)
Glucose-Capillary: 93 mg/dL (ref 70–99)
Glucose-Capillary: 115 mg/dL — ABNORMAL HIGH (ref 70–99)

## 2018-08-10 LAB — URINALYSIS, ROUTINE W REFLEX MICROSCOPIC
Bilirubin Urine: NEGATIVE
Glucose, UA: NEGATIVE mg/dL
Ketones, ur: NEGATIVE mg/dL
Nitrite: NEGATIVE
PH: 5 (ref 5.0–8.0)
Protein, ur: 100 mg/dL — AB
RBC / HPF: >50 RBC/hpf — ABNORMAL HIGH (ref 0–5)
SPECIFIC GRAVITY, URINE: 1.014 (ref 1.005–1.030)
WBC, UA: >50 WBC/hpf — ABNORMAL HIGH (ref 0–5)

## 2018-08-10 LAB — VALPROIC ACID LEVEL: Valproic Acid Lvl: 31 ug/mL — ABNORMAL LOW (ref 50.0–100.0)

## 2018-08-10 LAB — PREPARE RBC (CROSSMATCH)

## 2018-08-10 MED ORDER — SODIUM CHLORIDE 0.9% IV SOLUTION
Freq: Once | INTRAVENOUS | Status: AC
  Administered 2018-08-10: 08:00:00 via INTRAVENOUS

## 2018-08-10 NOTE — Progress Notes (Signed)
eLink Physician-Brief Progress Note Patient Name: Juan [name] DOB: 04-10-1941 MRN: [id number]   Date of Service  08/10/2018  HPI/Events of Note  Acute blood loss anemia with unclear source of bleeding  eICU Interventions  Check retic count, type and cross 1 unit of PRBC and transfuse when available        Frederik Pear 08/10/2018, 4:47 AM

## 2018-08-10 NOTE — Progress Notes (Signed)
NAME:  Juan [name], MRN:  [id number], DOB:  [birth year], LOS: 34 ADMISSION DATE:  07/27/2018, CONSULTATION DATE:  3/13 REFERRING MD:  Lupita Leash, CHIEF COMPLAINT:  Acute encephalopathy and worsening mental status    Brief History   78 year old male patient admitted initially on 3/7 with sepsis secondary secondary to pseudomonas urinary tract infection with associated acute on chronic renal failure secondary to obstructive uropathy.  Over the course of his hospitalization developed progressive encephalopathy, with concern for nonconvulsive status on 3/12.  This was felt possibly secondary to neurotoxicity from cefepime.  Intubated for airway protection. He was initially under propofol, and transition to Versed due to severe bradycardia on 3/16, Versed was stopped 3/17  Past Medical History  Chronic urinary retention with prostate hypertrophy, prior Pseudomonas urinary tract infections, stage III chronic kidney disease, moderate to severe pulmonary hypertension, chronic diastolic heart failure, myelodysplasia with myelodysplastic syndrome, chronic back pain  Significant Hospital Events   3/7: Admitted with presumptive diagnosis of urinary tract infection 3/9: worsening renal failure, seen by urology, Foley catheter placed 3/12: Worsening mental status, EEG worrisome for possible nonconvulsive status.  Felt possibly due to neurotoxicity from cefepime this was discontinued and Zosyn was started.  lumbar puncture performed, for possible meningitis.  Infectious disease consulted, neurology consulted, started on Keppra, moved to [facility] for continuous EEG monitoring 3/13 remains unresponsive, developed worsening hypoxia staph from prior LP unremarkable without evidence of meningitis.  Pulmonary asked to evaluate 3/17 Episode of bradycardia overnight in spite of stopping propofol hence dopamine started. Episode of mucus plugging 3/17 versed stopped 3/24>> No improvement in neuro status despite off sedation s   Consults:   ID Neurology Urology PCCM  Procedures:  3/12 Fluoro-guided LP performed at L3-L4. Opening pressure 12 cm H20. 13.5 ml clear CSF obtained  Significant Diagnostic Tests:  CT abdomen pelvis 3/7 showed bilateral hydronephrosis with bladder wall thickening and multiple bladder diverticuli suggesting chronic bladder outlet obstruction with cystitis MRI brain 3/12: Truncated and motion degraded examination without visible acute abnormality.  MRI L spine 3/12:1. Mild foraminal narrowing bilaterally at L4-5 is worse on the left.2. Chronic endplate changes at R6-7 with associated Schmorl's node. 3. Minimal rightward disc bulging without significant stenosis.4. 10 mm solid lesion at the upper pole of the right kidney was not well seen on recent CT scans. Small renal cell carcinoma is not excluded. Recommend dedicated MRI of the kidneys as the patient's condition allows. 5. Stable cyst at the lower pole of the right kidney.  EEG 3/12 frequent periodic discharges concerning for triphasic versus epileptiform GPD's 3/17 EEG >>burst suppression, triphasics GPD's US abdomen 3/12: 1. No acute findings. 2. Single visualized gallstone. No evidence of acute cholecystitis. No other abnormality.  MRI brain 3/21 neg  CT Head 3/24 No acute infarct, no hemorrhage no edema. Small lipoma between lateral ventricles, mild periventricular small vessel disease  CXR 3/25 Moderate pulmonary vascular congestion, low lung volumes.   Micro Data:  UC 3/7 pseudomonas (cipro resistant) Urine cx 3/12 >> pseudomonas BCx2 3/8>>> neg BCX2 3/12>>>neg CSF 3/12: neg resp culture 3/13>>>nml flora 3/16 urine >> pseudomonas resp 3/18 >> providencia, multidrug-resistant (S ceftriaxone)  Antimicrobials:  Cefepime 3/7>>3/12 Zosyn 3/12>>> 3/19 zyvox 3/13>>>3/15 ceftx 3/21 (HCAP ) >>  Interim history/subjective:  1uPRBC overnight for low Hgb Tolerating vent wean this morning No improvement in neuro  status   Objective   Blood pressure (!) 148/70, pulse (!) 121, temperature 100 F (37.8 C), temperature source Oral, resp. rate 18, height 6\' 3"  (  1.905 m), weight 89.2 kg, SpO2 97 %.    Vent Mode: PSV;CPAP FiO2 (%):  [30 %] 30 % Set Rate:  [16 bmp] 16 bmp Vt Set:  [670 mL] 670 mL PEEP:  [5 cmH20] 5 cmH20 Pressure Support:  [5 cmH20-10 cmH20] 5 cmH20 Plateau Pressure:  [8 cmH20-16 cmH20] 16 cmH20   Intake/Output Summary (Last 24 hours) at 08/10/2018 1036 Last data filed at 08/10/2018 9528 Gross per 24 hour  Intake 2342.5 ml  Output 2555 ml  Net -212.5 ml   Filed Weights   08/08/18 0500 08/09/18 0200 08/10/18 0618  Weight: 89.6 kg 90.8 kg 89.2 kg   Examination:  General: Ill appearing man, intubated, off all sedation Neuro: Does not respond to painful stimuli. Cough/gag reflex intact. Pupils equal, round, reactive  NCAT, ETT OGT secure, in place  Trachea midline  Symmetrical chest expansion.  Diminished bibasilar breath sounds  RRR s1s2 no r/g/m no JVD  Abdomen soft, round, ndnt, bowel sounds x4  Symmetrical bulk and tone, no obvious deformity, Non-pitting edema BUE BLE  Skin clean dry warm intact no rash    Resolved Hospital Problem list   Transient bradycardia-due to propofol, resolved Hyperchloremia and Non-anion gap metabolic acidosis, resolved Assessment & Plan:   Acute Metabolic Encephalopathy  Nonconvulsive status 2/2 neurotoxicity from?cefepime Underwent deep sedation with Versed to initiate burst suppression.  Remains comatose, has been off Versed since 3/17 but remains on AEDs (Keppra Vimpat VPA)  Plan Neuro following, appreciate recs Continue Keppra, renal dose, Depakote decreased to 750 q12, Vimpat 100mg  BID  All other sedating medications held, no Versed since 3/17 Neurologic prognosis concerning given his failure to wake up. Possible contribution from his hypernatremia, need to correct with free water( 400 cc Q 4) and d5 Consider GOC family meeting when able  to more clearly prognosticate neuro status  Acute hypoxic respiratory failure 2/2 evolving pulmonary infiltrates and atx super imposed on underlying severe PH and OSA  intermittent episodes of mucous plugging reported Pulmonary vascular congestion on CXR 3/25 with low lung volumes, personally reviewed  Plan -Tolerating SBT 3/25, however neuro status will preclude extubation -Continue AM WUA/SBT, continue SBT as tolerated and resume vent support when needed   -Continue Hypertonic saline nebs  - Diurese as renal function permits (net positive 6.5L)  -Consider trach, Day 11 ETT  -CPT, pulmonary hygiene  -AM CXR   Hypertension- Scheduled hydralazine and PRN hydralazine   Acute on chronic renal failure (st III CKD) 2/2 chronic urinary retention w/ h/o poorly functioning bladder, enlarged prostate and mild to mod obstruction. Has had multiple recurrent UTIs. Has not completed treatment in past d/t financial restraints.  Solid lesion on upper pole of right kidney.  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 - Not been treated due to poor performance status, last oncology visit 06/10/2018 CT abdomen imaging also noting innumerable lytic lesions involving the spine Leukocytosis- baseline 22k range Plan Follow CBC May be related to progression of his untreated disease Consider checking  circulating  blasts ( anemia, leukocytosis with circulating blasts between 5 and 14% due to undiagnosed CAL-R pos ET 2015.)  Iron def anemia HGB drop of 1.5 grams last 24 Plan Transfuse for hemoglobin 7 or lower, trend CBC  Monitor for obvious source of bleeding Consider [name] for HGB of < 8   Palliation discussions  Need to be considered  to align plan of care with clear goals of care in light of continued poor neurological status, and possible worsening of 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    Day 11 ETT    Best practice:  Diet: NPO, TF Pain/Anxiety/Delirium protocol (if indicated): 3/13 VAP protocol (if indicated): 3/13 DVT prophylaxis: [place] heparin  GI prophylaxis: H2B Glucose control: ssi Mobilitity BR Code Status: full code  Family Communication: No family at bedside  Disposition: Continue ICU level of care    Critical Care Time 35 minutes   Eliseo Gum MSN, AGACNP-BC [place] [phone] If no answer, [phone] 08/10/2018, 10:36 AM

## 2018-08-10 NOTE — Progress Notes (Signed)
eLink Physician-Brief Progress Note Patient Name: Juan [name] DOB: 1941/05/03 MRN: [id number]   Date of Service  08/10/2018  HPI/Events of Note  Temp spike to 103, marked leukocytosis and no recent cultures  eICU Interventions  Culture blood x 2, urine and respiratory secretions        Frederik Pear 08/10/2018, 4:13 AM

## 2018-08-10 NOTE — Progress Notes (Signed)
Called Juan [name] and spoke with Dr. Lucile Shutters and made him aware of hgb of 6.7, Nurse called patient's son with MD over speaker phone over in room [place] and MD explained need for blood and consent obtained from son with Hinton Dyer, RN also in room as witness.

## 2018-08-10 NOTE — Progress Notes (Addendum)
NEUROLOGY PROGRESS NOTE  Subjective: Patient seen and examined.  Still unable to provide history.  Mild eye opening with noxious stimuli  Exam: Vitals:   08/10/18 0914 08/10/18 0915  BP: (!) 152/68 (!) 148/70  Pulse:  (!) 121  Resp:  18  Temp:  100 F (37.8 C)  SpO2:  97%    Physical Exam   HEENT-  Normocephalic, no lesions, without obvious abnormality.  Normal external eye and conjunctiva.   Extremities- Warm, dry and intact Musculoskeletal-no joint tenderness, deformity or swelling Skin-warm and dry, no hyperpigmentation, edematous    Neuro:  Mental Status: Intubated and ventilated.  Patient does not respond to verbal stimuli.  Does not follow commands.  Mildly opens eyes to noxious stimuli. Cranial Nerves: II: No blink to threat III,IV, VI: Doll's eyes intact but mild, pupillary, corneal reflexes intact V,VII: Face symmetric,  VIII: No response  Motor: No response to noxious stimuli Sensory: Mildly opens eyes to noxious stimuli Deep Tendon Reflexes: 0 throughout     Medications:  Scheduled: . chlorhexidine gluconate (MEDLINE KIT)  15 mL Mouth Rinse BID  . Chlorhexidine Gluconate Cloth  6 each Topical Daily  . clotrimazole  1 application Topical BID  . famotidine  20 mg Per Tube Daily  . feeding supplement (PRO-STAT SUGAR FREE 64)  30 mL Per Tube BID  . free water  400 mL Per Tube Q4H  . heparin  5,000 Units Subcutaneous Q8H  . hydrALAZINE  75 mg Oral TID  . hydrocortisone cream  1 application Topical BID  . lacosamide  200 mg Per Tube BID  . levETIRAcetam  500 mg Per Tube BID  . mouth rinse  15 mL Mouth Rinse 10 times per day  . valproic acid  750 mg Per Tube Q12H    Pertinent Labs/Diagnostics: VPA level 31  Ct Head Wo Contrast  Result Date: 08/09/2018 CLINICAL DATA:  Altered mental status/unresponsive EXAM:. IMPRESSION: Mild periventricular small vessel disease. No acute infarct. 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 No  hemorrhage or edema. Foci of arterial vascular calcification noted. There is mucosal thickening in several ethmoid air cells. Electronically Signed   By: Lowella Grip III M.D.   On: 08/09/2018 14:02   Dg Chest Port 1 View  Result Date: 08/10/2018 CLINICAL DATA:  Acute on chronic respiratory failure with hypoxia. Marland Kitchen IMPRESSION: 1. Low lung volumes. 2. Borderline cardiomegaly with moderate pulmonary vascular congestion. 3. Stable bibasilar airspace disease. Electronically Signed   By: San Morelle M.D.   On: 08/10/2018 07:48   Dg Chest Port 1 View  Result Date: 08/09/2018 CLINICAL DATA:  . IMPRESSION: Endotracheal tube in good position. Bibasilar airspace disease unchanged. Possible pneumonia. Electronically Signed   By: Franchot Gallo M.D.   On: 08/09/2018 07:04     Etta Quill PA-C [facility] [phone]   Assessment:-No significant change in assessment 78 year old male, with past medical history of myeloproliferative disease, CHF, coronary artery disease, sleep apnea and recurrent UTIs.  He was admitted for treatment of Pseudomonas UTI developed encephalopathy and bifrontal triphasic appearing discharges concerning for possible status epilepticus with some response to Ativan and then some left arm jerking associated with discharges on Friday, 07/29/2018-aggressively treated as status epilepticus with Versed from 3/13-3/15 titrated to seizure suppression subsequently followed by propofol in an attempt to achieve burst suppression which is not obtained until evening of 08/01/2018 and then stopped after 8 hours of burst suppression due to bradycardia and then started back on Versed  which was eventually weaned off on 08/02/2018. Continues to be sedated and encephalopathic. Dr. Leonel Ramsay assessed and felt consistent with possible cefepime toxicity in the setting of renal dysfunction along with inability to clear Versed(prolonged use and derangement renal function limiting  clearance).  Along with this, in the setting of low albumin, Depakote (Free level elevated) which might be contributing to the encephalopathy   Impression:  Multifactorial toxic metabolic encephalopathy Possible status which has resolved  Recommendations: -Continue Keppra at renal dose -Depakote has been decreased to 750 every 12 hours with VPA level of 31 today, will not make any changes in Depakote dose - Continue Vimpat 100 mg twice daily   08/10/2018, 9:36 AM  Attending Neurohospitalist Addendum Patient seen and examined with APP/Resident. Agree with the history and physical as documented above. Agree with the plan as documented, which I helped formulate. I have independently reviewed the chart, obtained history, review of systems and examined the patient.I have personally reviewed pertinent head/neck/spine imaging (CT/MRI). Please feel free to call with any questions. --- Amie Portland, MD [facility] Pager: [phone]  If 7pm to 7am, please call on call as listed on AMION.

## 2018-08-11 ENCOUNTER — Inpatient Hospital Stay (HOSPITAL_COMMUNITY): Payer: Medicare Other

## 2018-08-11 DIAGNOSIS — D469 Myelodysplastic syndrome, unspecified: Secondary | ICD-10-CM

## 2018-08-11 LAB — BASIC METABOLIC PANEL
Anion gap: 11 (ref 5–15)
BUN: 121 mg/dL — ABNORMAL HIGH (ref 8–23)
CO2: 15 mmol/L — ABNORMAL LOW (ref 22–32)
Calcium: 10 mg/dL (ref 8.9–10.3)
Chloride: 124 mmol/L — ABNORMAL HIGH (ref 98–111)
Creatinine, Ser: 2.04 mg/dL — ABNORMAL HIGH (ref 0.61–1.24)
GFR calc non Af Amer: 31 mL/min — ABNORMAL LOW (ref >60)
GFR, EST AFRICAN AMERICAN: 36 mL/min — AB (ref >60)
Glucose, Bld: 152 mg/dL — ABNORMAL HIGH (ref 70–99)
Potassium: 3.6 mmol/L (ref 3.5–5.1)
Sodium: 150 mmol/L — ABNORMAL HIGH (ref 135–145)

## 2018-08-11 LAB — CBC
HCT: 24.4 % — ABNORMAL LOW (ref 39.0–52.0)
Hemoglobin: 7.2 g/dL — ABNORMAL LOW (ref 13.0–17.0)
MCH: 25 pg — ABNORMAL LOW (ref 26.0–34.0)
MCHC: 29.5 g/dL — ABNORMAL LOW (ref 30.0–36.0)
MCV: 84.7 fL (ref 80.0–100.0)
Platelets: 233 10*3/uL (ref 150–400)
RBC: 2.88 MIL/uL — ABNORMAL LOW (ref 4.22–5.81)
RDW: 19.9 % — ABNORMAL HIGH (ref 11.5–15.5)
WBC: 54 10*3/uL (ref 4.0–10.5)
nRBC: 2.7 % — ABNORMAL HIGH (ref 0.0–0.2)

## 2018-08-11 LAB — VALPROIC ACID LEVEL: Valproic Acid Lvl: 25 ug/mL — ABNORMAL LOW (ref 50.0–100.0)

## 2018-08-11 LAB — URINE CULTURE: Culture: NO GROWTH

## 2018-08-11 LAB — GLUCOSE, CAPILLARY
Glucose-Capillary: 104 mg/dL — ABNORMAL HIGH (ref 70–99)
Glucose-Capillary: 114 mg/dL — ABNORMAL HIGH (ref 70–99)
Glucose-Capillary: 114 mg/dL — ABNORMAL HIGH (ref 70–99)
Glucose-Capillary: 118 mg/dL — ABNORMAL HIGH (ref 70–99)
Glucose-Capillary: 119 mg/dL — ABNORMAL HIGH (ref 70–99)
Glucose-Capillary: 124 mg/dL — ABNORMAL HIGH (ref 70–99)

## 2018-08-11 NOTE — Progress Notes (Signed)
Neurology Progress Note   S:// Patient seen and examined. No changes in examination overnight per RN  O:// Current vital signs: BP 126/64   Pulse 96   Temp 97.6 F (36.4 C) (Axillary)   Resp (!) 24   Ht 6' 3" (1.905 m)   Wt 92.8 kg   SpO2 99%   BMI 25.57 kg/m  Vital signs in last 24 hours: Temp:  [97.6 F (36.4 C)-101.4 F (38.6 C)] 97.6 F (36.4 C) (03/26 0734) Pulse Rate:  [94-130] 96 (03/26 0800) Resp:  [16-24] 24 (03/26 0800) BP: (113-153)/(53-79) 126/64 (03/26 0800) SpO2:  [91 %-100 %] 99 % (03/26 0800) FiO2 (%):  [30 %] 30 % (03/26 0807) Weight:  [92.8 kg] 92.8 kg (03/26 0400) Neurological exam Mental status: Intubated and ventilated on no sedation since 08/02/2018 Does not respond to verbal stimulus On deep sternal rub, opens eyes mildly Cranial nerve exam: Pupils are equal round react light, corneal reflexes are present bilaterally, there is facial symmetry difficult to ascertain with the endotracheal tube but is probably symmetric. Motor exam: No spontaneous movement.  No response to noxious simulation Sensory exam: As above DTRs absent throughout Medications  Current Facility-Administered Medications:  .  0.9 %  sodium chloride infusion, , Intravenous, Continuous, Byrum, Rose Fillers, MD, Stopped at 08/09/18 1338 .  acetaminophen (TYLENOL) solution 650 mg, 650 mg, Per Tube, Q6H PRN, Anders Simmonds, MD, 650 mg at 08/11/18 0217 .  albuterol (PROVENTIL) (2.5 MG/3ML) 0.083% nebulizer solution 2.5 mg, 2.5 mg, Nebulization, Q6H PRN, Rigoberto Noel, MD, 2.5 mg at 08/10/18 1547 .  cefTRIAXone (ROCEPHIN) 2 g in sodium chloride 0.9 % 100 mL IVPB, 2 g, Intravenous, Q24H, Bowser, Laurel Dimmer, NP, Stopped at 08/10/18 [phone] .  chlorhexidine gluconate (MEDLINE KIT) (PERIDEX) 0.12 % solution 15 mL, 15 mL, Mouth Rinse, BID, Erick Colace, NP, 15 mL at 08/10/18 2022 .  Chlorhexidine Gluconate Cloth 2 % PADS 6 each, 6 each, Topical, Daily, Rush Farmer, MD, 6 each at 08/10/18  2346 .  clotrimazole (LOTRIMIN) 1 % cream 1 application, 1 application, Topical, BID, Erick Colace, NP, 1 application at 02/07/29 2345 .  dextrose 5 % solution, , Intravenous, Continuous, Bowser, Grace E, NP, Last Rate: 30 mL/hr at 08/11/18 0600 .  famotidine (PEPCID) 40 MG/5ML suspension 20 mg, 20 mg, Per Tube, Daily, Rush Farmer, MD, 20 mg at 08/10/18 0915 .  feeding supplement (PRO-STAT SUGAR FREE 64) liquid 30 mL, 30 mL, Per Tube, BID, Rigoberto Noel, MD, 30 mL at 08/10/18 2201 .  feeding supplement (VITAL AF 1.2 CAL) liquid 1,000 mL, 1,000 mL, Per Tube, Continuous, Rigoberto Noel, MD, Last Rate: 60 mL/hr at 08/10/18 2351, 1,000 mL at 08/10/18 2351 .  fentaNYL (SUBLIMAZE) injection 50 mcg, 50 mcg, Intravenous, Q15 min PRN, Erick Colace, NP .  fentaNYL (SUBLIMAZE) injection 50 mcg, 50 mcg, Intravenous, Q2H PRN, Erick Colace, NP, 50 mcg at 08/11/18 0122 .  free water 400 mL, 400 mL, Per Tube, Q4H, Byrum, Rose Fillers, MD, 400 mL at 08/11/18 0540 .  heparin injection 5,000 Units, 5,000 Units, Subcutaneous, Q8H, Erick Colace, NP, 5,000 Units at 08/11/18 0533 .  hydrALAZINE (APRESOLINE) injection 10 mg, 10 mg, Intravenous, Q6H PRN, Eliezer Bottom T, MD, 10 mg at 08/05/18 1227 .  hydrALAZINE (APRESOLINE) tablet 75 mg, 75 mg, Oral, TID, Rigoberto Noel, MD, 75 mg at 08/10/18 2201 .  hydrocortisone cream 1 % 1 application, 1 application, Topical, BID,  Babcock, Peter E, NP, 1 application at 08/10/18 2346 .  lacosamide (VIMPAT) tablet 200 mg, 200 mg, Per Tube, BID, Yacoub, Wesam G, MD, 200 mg at 08/10/18 2201 .  levETIRAcetam (KEPPRA) 100 MG/ML solution 500 mg, 500 mg, Per Tube, BID, Alva, Rakesh V, MD, 500 mg at 08/10/18 2202 .  LORazepam (ATIVAN) injection 1 mg, 1 mg, Intravenous, Q4H PRN, Babcock, Peter E, NP .  MEDLINE mouth rinse, 15 mL, Mouth Rinse, 10 times per day, Babcock, Peter E, NP, 15 mL at 08/11/18 0540 .  [DISCONTINUED] ondansetron (ZOFRAN) tablet 4 mg, 4 mg, Oral, Q6H PRN  **OR** ondansetron (ZOFRAN) injection 4 mg, 4 mg, Intravenous, Q6H PRN, Babcock, Peter E, NP, 4 mg at 07/24/18 2202 .  valproic acid (DEPAKENE) solution 750 mg, 750 mg, Per Tube, Q12H, , , MD, 750 mg at 08/11/18 0533 Labs CBC-WBC 54,000, hemoglobin 7.2 platelet count 233 sodium 150, BUN 121, elevated creatinine    Component Value Date/Time   WBC 54.0 (HH) 08/11/2018 0333   RBC 2.88 (L) 08/11/2018 0333   HGB 7.2 (L) 08/11/2018 0333   HGB 9.7 (L) 06/10/2018 1001   HGB 10.7 (L) 03/30/2017 1420   HGB 9.8 (L) 11/16/2013 0847   HCT 24.4 (L) 08/11/2018 0333   HCT 34.1 (L) 03/30/2017 1420   HCT 30.7 (L) 11/16/2013 0847   PLT 233 08/11/2018 0333   PLT 131 (L) 06/10/2018 1001   PLT 287 Large platelets present 03/30/2017 1420   PLT 323 11/16/2013 0847   MCV 84.7 08/11/2018 0333   MCV 86 03/30/2017 1420   MCV 80.3 11/16/2013 0847   MCH 25.0 (L) 08/11/2018 0333   MCHC 29.5 (L) 08/11/2018 0333   RDW 19.9 (H) 08/11/2018 0333   RDW 18.1 (H) 03/30/2017 1420   RDW 20.0 (H) 11/16/2013 0847   LYMPHSABS 2.6 08/10/2018 0333   LYMPHSABS 1.4 03/30/2017 1420   MONOABS 17.9 (H) 08/10/2018 0333   EOSABS 0.2 08/10/2018 0333   EOSABS 0.2 03/30/2017 1420   BASOSABS 0.5 (H) 08/10/2018 0333   BASOSABS 0.4 (H) 03/30/2017 1420    CMP     Component Value Date/Time   NA 150 (H) 08/11/2018 0333   NA 142 02/28/2018 1202   NA 143 03/30/2017 1420   NA 138 11/16/2013 0847   K 3.6 08/11/2018 0333   K 4.3 03/30/2017 1420   K 4.5 11/16/2013 0847   CL 124 (H) 08/11/2018 0333   CL 110 (H) 03/30/2017 1420   CO2 15 (L) 08/11/2018 0333   CO2 27 03/30/2017 1420   CO2 22 11/16/2013 0847   GLUCOSE 152 (H) 08/11/2018 0333   GLUCOSE 106 03/30/2017 1420   BUN 121 (H) 08/11/2018 0333   BUN 68 (H) 02/28/2018 1202   BUN 38 (H) 03/30/2017 1420   BUN 20.2 11/16/2013 0847   CREATININE 2.04 (H) 08/11/2018 0333   CREATININE 1.41 (H) 06/10/2018 1001   CREATININE 1.3 (H) 03/30/2017 1420   CREATININE 1.2  11/16/2013 0847   CALCIUM 10.0 08/11/2018 0333   CALCIUM 9.8 03/30/2017 1420   CALCIUM 9.8 11/16/2013 0847   PROT 5.6 (L) 08/04/2018 0426   PROT 6.0 (L) 03/30/2017 1420   PROT 6.7 11/16/2013 0847   ALBUMIN 1.6 (L) 08/04/2018 0426   ALBUMIN 2.8 (L) 03/30/2017 1420   ALBUMIN 3.4 (L) 11/16/2013 0847   AST 15 08/04/2018 0426   AST 26 06/10/2018 1001   AST 25 11/16/2013 0847   ALT 10 08/04/2018 0426   ALT 28 06/10/2018 1001     ALT 22 03/30/2017 1420   ALT 11 11/16/2013 0847   ALKPHOS 86 08/04/2018 0426   ALKPHOS 116 (H) 03/30/2017 1420   ALKPHOS 125 11/16/2013 0847   BILITOT 0.4 08/04/2018 0426   BILITOT 0.3 06/10/2018 1001   BILITOT 0.35 11/16/2013 0847   GFRNONAA 31 (L) 08/11/2018 0333   GFRNONAA 48 (L) 06/10/2018 1001   GFRAA 36 (L) 08/11/2018 0333   GFRAA 56 (L) 06/10/2018 1001  Depakote level 25  Imaging I have reviewed images in epic and the results pertinent to this consultation are: MRI brain without contrast on 07/28/2018 and 08/06/2018 are both unremarkable for acute process  Assessment: 78 year old man past medical history of myeloproliferative disease, CHF, coronary artery disease, sleep apnea and recurrent UTIs was admitted for treatment of complicated UTI and developed encephalopathy. He had bifrontal triphasic appearing discharges concerning for possible status epilepticus with some response to Ativan.  Clinically he also had some left arm jerking that was associated with discharges on the EEG and he was aggressively treated as status epilepticus with Versed from 3 13-3 15 with doses titrated for seizure suppression, but eventually propofol was added to achieve burst suppression on the evening of 08/01/2018, which was stopped after 8 hours and eventually weaned off of all sedation on 08/02/2018. He continues to be minimally responsive with only mild eyes opening on noxious stimulation and no longer purposeful movement.  He has intact brainstem reflexes. Prolonged use of  Versed and cefepime toxicity in the setting of deranged renal function were thought to be attributing to his current clinical picture but he has shown no improvement now at least after 9 days of no sedation on board. Given this exam, and the time course, I am not sure that he will have a good chance for neurologically meaningful recovery. His clinical course is also complicated by renal failure as well as myeloproliferative disease with white cell counts in the 50-60,000.  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  Continue current dose. Medical management per primary team I would at this time discuss the current clinical picture with the family to discuss goals of care because his current neurological exam and progress indicates that the chances of neurologically meaningful recovery will be low going forward. I have discussed my plan in detail with Dr. Lamonte Sakai on the unit.  We will follow with you.  -- Amie Portland, MD [facility] Pager: [phone] If 7pm to 7am, please call on call as listed on AMION.

## 2018-08-11 NOTE — Progress Notes (Signed)
NAME:  Juan [name], MRN:  [id number], DOB:  1940/06/19, LOS: 2 ADMISSION DATE:  07/31/2018, CONSULTATION DATE:  3/13 REFERRING MD:  Lupita Leash, CHIEF COMPLAINT:  Acute encephalopathy and worsening mental status    Brief History   78 year old male patient admitted initially on 3/7 with sepsis secondary secondary to pseudomonas urinary tract infection with associated acute on chronic renal failure secondary to obstructive uropathy.  Over the course of his hospitalization developed progressive encephalopathy, with concern for nonconvulsive status on 3/12.  This was felt possibly secondary to neurotoxicity from cefepime.  Intubated for airway protection. He was initially under propofol, and transition to Versed due to severe bradycardia on 3/16, Versed was stopped 3/17  Past Medical History  Chronic urinary retention with prostate hypertrophy, prior Pseudomonas urinary tract infections, stage III chronic kidney disease, moderate to severe pulmonary hypertension, chronic diastolic heart failure, myelodysplasia with myelodysplastic syndrome, chronic back pain  Significant Hospital Events   3/7: Admitted with presumptive diagnosis of urinary tract infection 3/9: worsening renal failure, seen by urology, Foley catheter placed 3/12: Worsening mental status, EEG worrisome for possible nonconvulsive status.  Felt possibly due to neurotoxicity from cefepime this was discontinued and Zosyn was started.  lumbar puncture performed, for possible meningitis.  Infectious disease consulted, neurology consulted, started on Keppra, moved to [facility] for continuous EEG monitoring 3/13 remains unresponsive, developed worsening hypoxia staph from prior LP unremarkable without evidence of meningitis.  Pulmonary asked to evaluate 3/17 Episode of bradycardia overnight in spite of stopping propofol hence dopamine started. Episode of mucus plugging 3/17 versed stopped 3/24>> No improvement in neuro status despite off sedation s   Consults:   ID Neurology Urology PCCM  Procedures:  3/12 Fluoro-guided LP performed at L3-L4. Opening pressure 12 cm H20. 13.5 ml clear CSF obtained  Significant Diagnostic Tests:  CT abdomen pelvis 3/7 showed bilateral hydronephrosis with bladder wall thickening and multiple bladder diverticuli suggesting chronic bladder outlet obstruction with cystitis MRI brain 3/12: Truncated and motion degraded examination without visible acute abnormality.  MRI L spine 3/12:1. Mild foraminal narrowing bilaterally at L4-5 is worse on the left.2. Chronic endplate changes at O6-7 with associated Schmorl's node. 3. Minimal rightward disc bulging without significant stenosis.4. 10 mm solid lesion at the upper pole of the right kidney was not well seen on recent CT scans. Small renal cell carcinoma is not excluded. Recommend dedicated MRI of the kidneys as the patient's condition allows. 5. Stable cyst at the lower pole of the right kidney.  EEG 3/12 frequent periodic discharges concerning for triphasic versus epileptiform GPD's 3/17 EEG >>burst suppression, triphasics GPD's US abdomen 3/12: 1. No acute findings. 2. Single visualized gallstone. No evidence of acute cholecystitis. No other abnormality.  MRI brain 3/21 neg  CT Head 3/24 No acute infarct, no hemorrhage no edema. Small lipoma between lateral ventricles, mild periventricular small vessel disease  CXR 3/25 Moderate pulmonary vascular congestion, low lung volumes.   Micro Data:  UC 3/7 pseudomonas (cipro resistant) Urine cx 3/12 >> pseudomonas BCx2 3/8>>> neg BCX2 3/12>>>neg CSF 3/12: neg resp culture 3/13>>>nml flora 3/16 urine >> pseudomonas resp 3/18 >> providencia, multidrug-resistant (S ceftriaxone) 08/10/2018 urine cultures negative Antimicrobials:  Cefepime 3/7>>3/12 Zosyn 3/12>>> 3/19 zyvox 3/13>>>3/15 ceftx 3/21 (HCAP ) >>  Interim history/subjective:  No significant change in mental status  Objective   Blood  pressure 126/64, pulse 96, temperature 97.6 F (36.4 C), temperature source Axillary, resp. rate (!) 24, height 6\' 3"  (1.905 m), weight 92.8 kg, SpO2 99 %.  Vent Mode: PRVC FiO2 (%):  [30 %] 30 % Set Rate:  [16 bmp] 16 bmp Vt Set:  [670 mL] 670 mL PEEP:  [5 cmH20] 5 cmH20 Pressure Support:  [5 cmH20] 5 cmH20 Plateau Pressure:  [9 cmH20-19 cmH20] 9 cmH20   Intake/Output Summary (Last 24 hours) at 08/11/2018 6301 Last data filed at 08/11/2018 0600 Gross per 24 hour  Intake 4383.01 ml  Output 1970 ml  Net 2413.01 ml   Filed Weights   08/10/18 0618 08/10/18 2330 08/11/18 0400  Weight: 89.2 kg 92.8 kg 92.8 kg   Examination:  General: Ill-appearing elderly male HEENT: Endotracheal tube in place no JVD lymphadenopathy is appreciated Neuro: Off all sedation does not follow commands CV: Sounds are distant PULM: Creased in the bases SW:FUXN, non-tender, bsx4 active  Extremities: warm/dry, +4 edema  Skin: no rashes or lesions     Resolved Hospital Problem list   Transient bradycardia-due to propofol, resolved Hyperchloremia and Non-anion gap metabolic acidosis, resolved Assessment & Plan:   Acute Metabolic Encephalopathy  Nonconvulsive status 2/2 neurotoxicity from?cefepime Underwent deep sedation with Versed to initiate burst suppression.  Remains comatose, has been off Versed since 3/17 but remains on AEDs (Keppra Vimpat VPA)  Plan Appreciate neurology's input Continue current antiseizure medication All sedating medications are on hold Appears to be a poor prognosis Questionable palliation near future  Acute hypoxic respiratory failure 2/2 evolving pulmonary infiltrates and atx super imposed on underlying severe PH and OSA  intermittent episodes of mucous plugging reported Pulmonary vascular congestion on CXR 3/25 with low lung volumes, personally reviewed  Plan 08/11/2018 mental status precludes extubation Continue to wean per protocol Consider tracheostomy day 12 of  intubation versus palliation  Hypertension- Hydralazine  Acute on chronic renal failure (st III CKD) 2/2 chronic urinary retention w/ h/o poorly functioning bladder, enlarged prostate and mild to mod obstruction. Has had multiple recurrent UTIs. Has not completed treatment in past d/t financial restraints.  Solid lesion on upper pole of right kidney.  Hypokalemia Hypernatremia Lab Results  Component Value Date   CREATININE 2.04 (H) 08/11/2018   CREATININE 2.02 (H) 08/10/2018   CREATININE 2.07 (H) 08/09/2018   CREATININE 1.41 (H) 06/10/2018   CREATININE 1.48 (H) 04/08/2018   CREATININE 1.50 (H) 03/02/2018   CREATININE 1.3 (H) 03/30/2017   CREATININE 1.3 (H) 07/30/2014   CREATININE 1.2 11/16/2013    Recent Labs  Lab 08/09/18 0420 08/10/18 0333 08/11/18 0333  K 3.6 4.0 3.6   Recent Labs  Lab 08/09/18 0420 08/10/18 0333 08/11/18 0333  NA 153* 153* 150*     Plan Avoid nephrotoxins Monitor electrolytes and renal function Continue free water  Providencia HCAP with new infiltrate Complicated Pseudomonas Urinary tract infection in setting of obstructive uropathy-chronic Foley catheter, colonizer per ID, treated with 10 days of antibiotics total T max 101.3 on 3/24 WBC 63.9 PCT 1.25 Plan Ceftriaxone restarted 08/06/2018 Monitor for culture data  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     Day 12 intermittent intubation    Best practice:  Diet: NPO, TF Pain/Anxiety/Delirium protocol (if indicated): Off all sedation for 7-day VAP protocol (if indicated): 3/13 DVT prophylaxis: [place] heparin  GI prophylaxis: H2B Glucose control: ssi Mobilitity BR Code Status: full code  Family Communication: 08/11/2018 no family at bedside Disposition: Continue ICU level of care    Charlo Time 35 minutes   Richardson Landry Minor ACNP Mary[facility] PCCM Pager [phone] till 1 pm If no answer page [phone] 08/11/2018, 9:53 AM

## 2018-08-11 NOTE — Progress Notes (Signed)
Received patient into 4B44 from 58M. Paged Byrum MD to clarify contact precautions order. Verbal order to d/c.

## 2018-08-12 ENCOUNTER — Inpatient Hospital Stay (HOSPITAL_COMMUNITY): Payer: Medicare Other

## 2018-08-12 ENCOUNTER — Inpatient Hospital Stay: Payer: Medicare Other

## 2018-08-12 ENCOUNTER — Inpatient Hospital Stay: Payer: Medicare Other | Admitting: Hematology & Oncology

## 2018-08-12 DIAGNOSIS — J810 Acute pulmonary edema: Secondary | ICD-10-CM

## 2018-08-12 LAB — CULTURE, RESPIRATORY W GRAM STAIN: Culture: NORMAL

## 2018-08-12 LAB — BASIC METABOLIC PANEL
ANION GAP: 13 (ref 5–15)
BUN: 126 mg/dL — ABNORMAL HIGH (ref 8–23)
CO2: 15 mmol/L — ABNORMAL LOW (ref 22–32)
Calcium: 9.5 mg/dL (ref 8.9–10.3)
Chloride: 119 mmol/L — ABNORMAL HIGH (ref 98–111)
Creatinine, Ser: 2.07 mg/dL — ABNORMAL HIGH (ref 0.61–1.24)
GFR calc Af Amer: 35 mL/min — ABNORMAL LOW (ref >60)
GFR calc non Af Amer: 30 mL/min — ABNORMAL LOW (ref >60)
GLUCOSE: 127 mg/dL — AB (ref 70–99)
Potassium: 4 mmol/L (ref 3.5–5.1)
Sodium: 147 mmol/L — ABNORMAL HIGH (ref 135–145)

## 2018-08-12 LAB — CBC WITH DIFFERENTIAL/PLATELET
Band Neutrophils: 0 %
Basophils Absolute: 0.4 10*3/uL — ABNORMAL HIGH (ref 0.0–0.1)
Basophils Relative: 1 %
Blasts: 0 %
Eosinophils Absolute: 0.4 10*3/uL (ref 0.0–0.5)
Eosinophils Relative: 1 %
HCT: 22.5 % — ABNORMAL LOW (ref 39.0–52.0)
Hemoglobin: 6.8 g/dL — CL (ref 13.0–17.0)
Lymphocytes Relative: 7 %
Lymphs Abs: 3.1 10*3/uL (ref 0.7–4.0)
MCH: 25.6 pg — ABNORMAL LOW (ref 26.0–34.0)
MCHC: 30.2 g/dL (ref 30.0–36.0)
MCV: 84.6 fL (ref 80.0–100.0)
Metamyelocytes Relative: 0 %
Monocytes Absolute: 11.6 10*3/uL — ABNORMAL HIGH (ref 0.1–1.0)
Monocytes Relative: 26 %
Myelocytes: 0 %
Neutro Abs: 29.3 10*3/uL — ABNORMAL HIGH (ref 1.7–7.7)
Neutrophils Relative %: 65 %
Other: 0 %
Platelets: 206 10*3/uL (ref 150–400)
Promyelocytes Relative: 0 %
RBC: 2.66 MIL/uL — ABNORMAL LOW (ref 4.22–5.81)
RDW: 19.9 % — ABNORMAL HIGH (ref 11.5–15.5)
WBC: 44.8 10*3/uL — ABNORMAL HIGH (ref 4.0–10.5)
nRBC: 0 /100 WBC
nRBC: 2.3 % — ABNORMAL HIGH (ref 0.0–0.2)

## 2018-08-12 LAB — HEMOGLOBIN AND HEMATOCRIT, BLOOD
HCT: 22.4 % — ABNORMAL LOW (ref 39.0–52.0)
HCT: 19.8 % — ABNORMAL LOW (ref 39.0–52.0)
HCT: 25 % — ABNORMAL LOW (ref 39.0–52.0)
Hemoglobin: 6.6 g/dL — CL (ref 13.0–17.0)
Hemoglobin: 5.8 g/dL — CL (ref 13.0–17.0)
Hemoglobin: 7.6 g/dL — ABNORMAL LOW (ref 13.0–17.0)

## 2018-08-12 LAB — VALPROIC ACID LEVEL: Valproic Acid Lvl: 24 ug/mL — ABNORMAL LOW (ref 50.0–100.0)

## 2018-08-12 LAB — GLUCOSE, CAPILLARY
Glucose-Capillary: 107 mg/dL — ABNORMAL HIGH (ref 70–99)
Glucose-Capillary: 111 mg/dL — ABNORMAL HIGH (ref 70–99)
Glucose-Capillary: 114 mg/dL — ABNORMAL HIGH (ref 70–99)
Glucose-Capillary: 125 mg/dL — ABNORMAL HIGH (ref 70–99)
Glucose-Capillary: 95 mg/dL (ref 70–99)

## 2018-08-12 LAB — PREPARE RBC (CROSSMATCH)

## 2018-08-12 LAB — CULTURE, RESPIRATORY

## 2018-08-12 LAB — PHOSPHORUS: Phosphorus: 5.4 mg/dL — ABNORMAL HIGH (ref 2.5–4.6)

## 2018-08-12 LAB — MAGNESIUM: Magnesium: 2.2 mg/dL (ref 1.7–2.4)

## 2018-08-12 LAB — OCCULT BLOOD X 1 CARD TO LAB, STOOL: Fecal Occult Bld: NEGATIVE

## 2018-08-12 MED ORDER — SODIUM CHLORIDE 0.9 % IV SOLN
Freq: Once | INTRAVENOUS | Status: AC
  Administered 2018-08-12: 07:00:00 via INTRAVENOUS

## 2018-08-12 MED ORDER — AMIODARONE IV BOLUS ONLY 150 MG/100ML
150.0000 mg | Freq: Once | INTRAVENOUS | Status: AC
  Administered 2018-08-12: 150 mg via INTRAVENOUS

## 2018-08-12 MED ORDER — SODIUM CHLORIDE 0.9% IV SOLUTION
Freq: Once | INTRAVENOUS | Status: AC
  Administered 2018-08-12: 13:00:00 via INTRAVENOUS

## 2018-08-12 MED ORDER — SODIUM CHLORIDE 0.9% IV SOLUTION: Freq: Once | INTRAVENOUS | Status: DC

## 2018-08-12 MED ORDER — AMIODARONE HCL IN DEXTROSE 360-4.14 MG/200ML-% IV SOLN
INTRAVENOUS | Status: AC
  Filled 2018-08-12: qty 200

## 2018-08-12 MED ORDER — PHENYLEPHRINE HCL-NACL 10-0.9 MG/250ML-% IV SOLN
INTRAVENOUS | Status: AC
  Filled 2018-08-12: qty 250

## 2018-08-12 MED ORDER — AMIODARONE LOAD VIA INFUSION
150.0000 mg | Freq: Once | INTRAVENOUS | Status: DC
  Administered 2018-08-12: 07:00:00 via INTRAVENOUS

## 2018-08-12 MED ORDER — SODIUM CHLORIDE 0.45 % IV SOLN
INTRAVENOUS | Status: DC
  Administered 2018-08-12: 04:00:00 via INTRAVENOUS

## 2018-08-12 MED ORDER — PHENYLEPHRINE HCL-NACL 10-0.9 MG/250ML-% IV SOLN
0.0000 ug/min | INTRAVENOUS | Status: DC
  Administered 2018-08-12: 20 ug/min via INTRAVENOUS
  Administered 2018-08-12: 25 ug/min via INTRAVENOUS
  Administered 2018-08-13 (×2): 80 ug/min via INTRAVENOUS
  Administered 2018-08-13: 10 ug/min via INTRAVENOUS
  Administered 2018-08-13: 80 ug/min via INTRAVENOUS
  Filled 2018-08-12 (×4): qty 250

## 2018-08-12 NOTE — Progress Notes (Signed)
[place] Progress Note Patient Name: Juan [name] DOB: 16-Mar-1941 MRN: [id number]   Date of Service  08/12/2018  HPI/Events of Note  Hemoglobin 6.8 g %  eICU Interventions  Transfuse 1 unit PRBC, His hematologist needs to weigh in on long term outlook and treatment goals.        Kerry Kass Terrion Gencarelli 08/12/2018, 5:35 AM

## 2018-08-12 NOTE — Progress Notes (Signed)
EEG Completed; Results Pending  

## 2018-08-12 NOTE — Progress Notes (Signed)
eLink Physician-Brief Progress Note Patient Name: Juan [name] DOB: 04/05/1941 MRN: [id number]   Date of Service  08/12/2018  HPI/Events of Note  Patient went into Afib with rvr with associated drop in his blood pressure. He was previously noted to have a low hemoglobin.  eICU Interventions  Stat 12 lead EKG, 500 ml iv saline bolus x1, unit of PRBC requested stat, Phenylephrine infusion, Amiodarone 150 mg iv x1        Cally Nygard U Ashaz Robling 08/12/2018, 6:33 AM

## 2018-08-12 NOTE — Progress Notes (Addendum)
Patient went into Afib with RVR with rates up to the 160s. B/Ps now soft with SBP ranging in the 70s to 90s. Right lower lung sounds more diminished than the left. UOP minimal. Paged ELink MD.Orders to draw STAT H&H, obtain 12 lead EKG, give 540mL NS bolus, give unit of PRBCs STAT, and amiodarone bolus.

## 2018-08-12 NOTE — Progress Notes (Addendum)
NAME:  Juan [name], MRN:  [id number], DOB:  14-Mar-1941, LOS: 72 ADMISSION DATE:  08/06/2018, CONSULTATION DATE:  3/13 REFERRING MD:  Lupita Leash, CHIEF COMPLAINT:  Acute encephalopathy and worsening mental status    Brief History   78 year old male patient admitted initially on 3/7 with sepsis secondary secondary to pseudomonas urinary tract infection with associated acute on chronic renal failure secondary to obstructive uropathy.  Over the course of his hospitalization developed progressive encephalopathy, with concern for nonconvulsive status on 3/12.  This was felt possibly secondary to neurotoxicity from cefepime.  Intubated for airway protection. He was initially under propofol, and transition to Versed due to severe bradycardia on 3/16, Versed was stopped 3/17  Past Medical History  Chronic urinary retention with prostate hypertrophy, prior Pseudomonas urinary tract infections, stage III chronic kidney disease, moderate to severe pulmonary hypertension, chronic diastolic heart failure, myelodysplasia with myelodysplastic syndrome, chronic back pain  Significant Hospital Events   3/7: Admitted with presumptive diagnosis of urinary tract infection 3/9: worsening renal failure, seen by urology, Foley catheter placed 3/12: Worsening mental status, EEG worrisome for possible nonconvulsive status.  Felt possibly due to neurotoxicity from cefepime this was discontinued and Zosyn was started.  lumbar puncture performed, for possible meningitis.  Infectious disease consulted, neurology consulted, started on Keppra, moved to [facility] for continuous EEG monitoring 3/13 remains unresponsive, developed worsening hypoxia staph from prior LP unremarkable without evidence of meningitis.  Pulmonary asked to evaluate 3/17 Episode of bradycardia overnight in spite of stopping propofol hence dopamine started. Episode of mucus plugging 3/17 versed stopped 3/24>> No improvement in neuro status despite off sedation s 3/26 >>  overnight with low hgb s/p transfusion, Afib w/RVR, hypotensive requiring pressors, oliguria  Consults:  ID Neurology Urology PCCM  Procedures:  3/12 Fluoro-guided LP performed at L3-L4. Opening pressure 12 cm H20. 13.5 ml clear CSF obtained 3/13 ETT >> 3/17 R DL PICC >>  Significant Diagnostic Tests:  CT abdomen pelvis 3/7 showed bilateral hydronephrosis with bladder wall thickening and multiple bladder diverticuli suggesting chronic bladder outlet obstruction with cystitis MRI brain 3/12: Truncated and motion degraded examination without visible acute abnormality.  MRI L spine 3/12:1. Mild foraminal narrowing bilaterally at L4-5 is worse on the left.2. Chronic endplate changes at E9-3 with associated Schmorl's node. 3. Minimal rightward disc bulging without significant stenosis.4. 10 mm solid lesion at the upper pole of the right kidney was not well seen on recent CT scans. Small renal cell carcinoma is not excluded. Recommend dedicated MRI of the kidneys as the patient's condition allows. 5. Stable cyst at the lower pole of the right kidney.  EEG 3/12 frequent periodic discharges concerning for triphasic versus epileptiform GPD's 3/17 EEG >>burst suppression, triphasics GPD's US abdomen 3/12: 1. No acute findings. 2. Single visualized gallstone. No evidence of acute cholecystitis. No other abnormality.  MRI brain 3/21 neg  CT Head 3/24 No acute infarct, no hemorrhage no edema. Small lipoma between lateral ventricles, mild periventricular small vessel disease  CXR 3/25 Moderate pulmonary vascular congestion, low lung volumes.   CXR 3/27 stable ETT/ gastric; bilateral LL atelectasis, multiple lucent areas in the bony structures raise the concern for possible degree of underlying multiple myeloma  Micro Data:  UC 3/7 pseudomonas (cipro resistant) Urine cx 3/12 >> pseudomonas BCx2 3/8>>> neg BCX2 3/12>>>neg CSF 3/12: neg resp culture 3/13>>>nml flora 3/16 urine >>  pseudomonas resp 3/18 >> providencia, multidrug-resistant (S ceftriaxone) 3/25 urine cultures negative 3/25 trach asp >> normal flora 3/25 BC x  2 >> ngtd x 2  Antimicrobials:  Cefepime 3/7>>3/12 Zosyn 3/12>>> 3/19 zyvox 3/13>>>3/15 ceftx 3/21 (HCAP ) >> 3/27  Interim history/subjective:  Decompensation overnight with hgb 6.8 s/p 1unit PRBC, Afib w/RVR s/p amiodarone bolus, fluid bolus 579m, oliguria, ongoing fever  No significant change in mental status  Objective   Blood pressure (!) 89/51, pulse (!) 121, temperature (!) 100.6 F (38.1 C), temperature source Oral, resp. rate (!) 21, height _0  (1.905 m), weight 95.9 kg, SpO2 96 %.    Vent Mode: PRVC FiO2 (%):  [30 %-40 %] 40 % Set Rate:  [16 bmp] 16 bmp Vt Set:  [670 mL] 670 mL PEEP:  [5 cmH20] 5 cmH20 Pressure Support:  [8 cmH20] 8 cmH20 Plateau Pressure:  [11 cmH20-17 cmH20] 15 cmH20   Intake/Output Summary (Last 24 hours) at 08/12/2018 0843 Last data filed at 08/12/2018 05643Gross per 24 hour  Intake 3499.67 ml  Output 1655 ml  Net 1844.67 ml   Filed Weights   08/10/18 2330 08/11/18 0400 08/12/18 0500  Weight: 92.8 kg 92.8 kg 95.9 kg   Examination:  General:  Ill appearing unresponsive male in NAD on MV HEENT: MM pink/moist, ETT/ OGT, pupils 5/reactive, anicteric, +corneal reflexes Neuro: unresponsive, no response to noxious stimuli CV: IRIR, no murmur, weak peripheral pulses PULM: even/non-labored on full support, breathing over set rate, lungs bilaterally coarse, +weak cough GI: soft, bs hypoactive, flexiseal, foley Extremities: warm/dry, anasarca   Skin: no rashes   Resolved Hospital Problem list   Transient bradycardia-due to propofol, resolved Hyperchloremia and Non-anion gap metabolic acidosis, resolved Hypertension  Assessment & Plan:   Acute Toxic /Metabolic Encephalopathy  Presumed nonconvulsive status +/- cefepime neurotoxicity S/p 8 hours of burst suppression on 3/16, all sedation off since  3/17, has since remained comatose Plan Appreciate neurology's input Continue current AED per Neurology- keppra/ depakote/ vimpat Pending EEG this am Ongoing neuro exams Prn ativan for seizure  Holding all sedation Appears to be a poor prognosis   Acute hypoxic respiratory failure 2/2 evolving pulmonary infiltrates and atx super imposed on underlying severe PH and OSA - intermittent episodes of mucous plugging reported - CXR 3/27 stable apparatus's, ongoing lower lobe atelectasis  Plan mental status precludes extubation/ not stable today for PSV trials Following trach culture Day 14 of ETT, if we do not move to palliative care, will need trach  Hypotension - likely related to rapid Afib/ RVR, slow progression of anemia and third spacing P:  Maintaining MAP after 1unit PRBC Transfuse for Hgb <7 HR control- now back in SR  New Onset Afib- now SR post transfusion - possibly related to anemia P:  Tele monitoring Converted to SR now, monitor Fever control  Acute on chronic renal failure (st III CKD) 2/2 chronic urinary retention w/ h/o poorly functioning bladder, enlarged prostate and mild to mod obstruction. Has had multiple recurrent UTIs. Has not completed prior treatments in past d/t financial restraints.  Solid lesion on upper pole of right kidney.  Hypokalemia Hypernatremia Lab Results  Component Value Date   CREATININE 2.07 (H) 08/12/2018   CREATININE 2.04 (H) 08/11/2018   CREATININE 2.02 (H) 08/10/2018   CREATININE 1.41 (H) 06/10/2018   CREATININE 1.48 (H) 04/08/2018   CREATININE 1.50 (H) 03/02/2018   CREATININE 1.3 (H) 03/30/2017   CREATININE 1.3 (H) 07/30/2014   CREATININE 1.2 11/16/2013    Recent Labs  Lab 08/10/18 0333 08/11/18 0333 08/12/18 0316  K 4.0 3.6 4.0   Recent Labs  Lab  08/10/18 0333 08/11/18 0333 08/12/18 0316  NA 153* 150* 147*   Plan Worsening uremia/ AKI- ?related to progressive MDS Avoid nephrotoxins Monitor electrolytes and renal  function Continue free water  Providencia HCAP with new infiltrate Complicated Pseudomonas Urinary tract infection in setting of obstructive uropathy-chronic Foley catheter, colonizer per ID, treated with 10 days of antibiotics total Ongoing severe leukocytosis- 60-50's WBC Ongoing fever Plan Finishing 7 days of Ceftriaxone 3/27 Following culture data from 3/25  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 - Not been treated due to poor performance status, last oncology visit 06/10/2018 CT abdomen imaging also noting innumerable lytic lesions involving the spine Leukocytosis- baseline 22k range Fever -CXR 3/27 showing multiple lucent areas raising concern for underlying process/ ?multiple myeloma Plan Pending EEG/ neurology recs, if we do not transition to comfort care will need hematology consult today for further recs Trending CBC  Iron def anemia Symptomatic anemia Plan Pending post transfusion Hgb Transfuse for Hgb < 7 Observe for bleeding- currently no signs    Global:  Given no neurological improvement, off all sedation since 3/17, with worsening renal function, and evidence of progression of myelodysplastic syndrome / myelofibrosis, will wait for EEG, and then call family / son to discuss what appears to be a very poor prognosis   Best practice:  Diet: NPO, TF Pain/Anxiety/Delirium protocol (if indicated): Off all sedation since 3/17 VAP protocol (if indicated): 3/13 DVT prophylaxis: SCDs, hold heparin  GI prophylaxis: H2B Glucose control: ssi Mobilitity BR Code Status: full code  Family Communication: pending Disposition:  ICU    App Critical Care Time 40 mins  Kennieth Rad, MSN, AGACNP-BC [facility] Pgr: [phone] or if no answer [phone] 08/12/2018, 9:45 AM  Attending Note:  78 year old male with extensive PMH who presents to PCCM with respiratory  failure due to acute encephalopathy.  No events overnight.  On exam, no gag, no response to stimuli.  I reviewed CXR myself, ETT is in a good position.  Discussed with PCCM-NP and with neurology.  EEG being done at this point.  Continue to correct metabolic issues which are near done.  Attempted to speak with the son once EEG is read and completed and neurology is ready to make recommendations.  PCCM will continue to manage.  The patient is critically ill with multiple organ systems failure and requires high complexity decision making for assessment and support, frequent evaluation and titration of therapies, application of advanced monitoring technologies and extensive interpretation of multiple databases.   Critical Care Time devoted to patient care services described in this note is  34  Minutes. This time reflects time of care of this signee Dr Jennet Maduro. This critical care time does not reflect procedure time, or teaching time or supervisory time of PA/NP/Med student/Med Resident etc but could involve care discussion time.  Rush Farmer, M.D. [facility]. Pager: [phone]. After hours pager: [phone].

## 2018-08-12 NOTE — Progress Notes (Signed)
Neurology Progress Note   S:// Patient is seen and examined.  No acute changes overnight. EEG has been ordered and pending at this time.   O:// Current vital signs: BP (!) 106/48   Pulse 82   Temp (!) 100.6 F (38.1 C) (Oral)   Resp (!) 23   Ht _0  (1.905 m)   Wt 95.9 kg   SpO2 95%   BMI 26.43 kg/m  Vital signs in last 24 hours: Temp:  [97.6 F (36.4 C)-102.1 F (38.9 C)] 100.6 F (38.1 C) (03/27 0721) Pulse Rate:  [80-135] 82 (03/27 0915) Resp:  [16-25] 23 (03/27 0915) BP: (77-145)/(43-90) 106/48 (03/27 0915) SpO2:  [94 %-99 %] 95 % (03/27 0915) FiO2 (%):  [30 %-40 %] 40 % (03/27 0721) Weight:  [95.9 kg] 95.9 kg (03/27 0500) No gross change in exam from yesterday Mental status: Intubated and ventilated.  On no sedation since 08/02/2018. Does not respond to verbal stimulation Does not respond to noxious stimulation much today-but on deep sternal rub has open eyes occasionally. Cranial nerve exam: Pupils are equal round reactive to light, corneal reflexes are present bilaterally, facial symmetry is difficult to ascertain with the endotracheal tube in place but is probably symmetric. Motor exam: No spontaneous movements observed.  No response to noxious stimulation. Sensory exam: As above DTRs are absent throughout   Medications  Current Facility-Administered Medications:  .  0.9 %  sodium chloride infusion (Manually program via Guardrails IV Fluids), , Intravenous, Once, Ogan, Okoronkwo U, MD .  0.9 %  sodium chloride infusion, , Intravenous, Continuous, Byrum, Rose Fillers, MD, Stopped at 08/09/18 1338 .  acetaminophen (TYLENOL) solution 650 mg, 650 mg, Per Tube, Q6H PRN, Anders Simmonds, MD, 650 mg at 08/12/18 0152 .  albuterol (PROVENTIL) (2.5 MG/3ML) 0.083% nebulizer solution 2.5 mg, 2.5 mg, Nebulization, Q6H PRN, Rigoberto Noel, MD, 2.5 mg at 08/10/18 1547 .  cefTRIAXone (ROCEPHIN) 2 g in sodium chloride 0.9 % 100 mL IVPB, 2 g, Intravenous, Q24H, Bowser, Laurel Dimmer, NP,  Stopped at 08/11/18 1010 .  chlorhexidine gluconate (MEDLINE KIT) (PERIDEX) 0.12 % solution 15 mL, 15 mL, Mouth Rinse, BID, Salvadore Dom E, NP, 15 mL at 08/11/18 1944 .  Chlorhexidine Gluconate Cloth 2 % PADS 6 each, 6 each, Topical, Daily, Rush Farmer, MD, 6 each at 08/11/18 1008 .  clotrimazole (LOTRIMIN) 1 % cream 1 application, 1 application, Topical, BID, Erick Colace, NP, 1 application at 16/10/96 2300 .  dextrose 5 % solution, , Intravenous, Continuous, Bowser, Grace E, NP, Last Rate: 30 mL/hr at 08/12/18 0724 .  famotidine (PEPCID) 40 MG/5ML suspension 20 mg, 20 mg, Per Tube, Daily, Rush Farmer, MD, 20 mg at 08/11/18 0928 .  feeding supplement (PRO-STAT SUGAR FREE 64) liquid 30 mL, 30 mL, Per Tube, BID, Rigoberto Noel, MD, 30 mL at 08/11/18 2113 .  feeding supplement (VITAL AF 1.2 CAL) liquid 1,000 mL, 1,000 mL, Per Tube, Continuous, Rigoberto Noel, MD, Last Rate: 60 mL/hr at 08/10/18 2351, 1,000 mL at 08/10/18 2351 .  fentaNYL (SUBLIMAZE) injection 50 mcg, 50 mcg, Intravenous, Q15 min PRN, Erick Colace, NP .  fentaNYL (SUBLIMAZE) injection 50 mcg, 50 mcg, Intravenous, Q2H PRN, Erick Colace, NP, 50 mcg at 08/11/18 0930 .  free water 400 mL, 400 mL, Per Tube, Q4H, Collene Gobble, MD, 400 mL at 08/12/18 0520 .  heparin injection 5,000 Units, 5,000 Units, Subcutaneous, Q8H, Erick Colace, NP, 5,000 Units at 08/12/18  3009 .  hydrALAZINE (APRESOLINE) injection 10 mg, 10 mg, Intravenous, Q6H PRN, Genevive Bi, Emily T, MD, 10 mg at 08/05/18 1227 .  hydrALAZINE (APRESOLINE) tablet 75 mg, 75 mg, Oral, TID, Rigoberto Noel, MD, 75 mg at 08/11/18 2113 .  hydrocortisone cream 1 % 1 application, 1 application, Topical, BID, Erick Colace, NP, 1 application at 23/30/07 2114 .  lacosamide (VIMPAT) tablet 200 mg, 200 mg, Per Tube, BID, Rush Farmer, MD, 200 mg at 08/11/18 2113 .  levETIRAcetam (KEPPRA) 100 MG/ML solution 500 mg, 500 mg, Per Tube, BID, Rigoberto Noel, MD, 500 mg at  08/11/18 2113 .  LORazepam (ATIVAN) injection 1 mg, 1 mg, Intravenous, Q4H PRN, Erick Colace, NP .  MEDLINE mouth rinse, 15 mL, Mouth Rinse, 10 times per day, Erick Colace, NP, 15 mL at 08/12/18 0521 .  [DISCONTINUED] ondansetron (ZOFRAN) tablet 4 mg, 4 mg, Oral, Q6H PRN **OR** ondansetron (ZOFRAN) injection 4 mg, 4 mg, Intravenous, Q6H PRN, Erick Colace, NP, 4 mg at 07/24/18 2202 .  phenylephrine (NEOSYNEPHRINE) 10-0.9 MG/250ML-% infusion, 0-400 mcg/min, Intravenous, Titrated, Ogan, Kerry Kass, MD, Stopped at 08/12/18 0645 .  valproic acid (DEPAKENE) solution 750 mg, 750 mg, Per Tube, Q12H, Amie Portland, MD, 750 mg at 08/12/18 0520 Labs CBC-white blood count 44.8, hemoglobin 6.6.     Component Value Date/Time   WBC 44.8 (H) 08/12/2018 0316   RBC 2.66 (L) 08/12/2018 0316   HGB 6.6 (LL) 08/12/2018 0635   HGB 9.7 (L) 06/10/2018 1001   HGB 10.7 (L) 03/30/2017 1420   HGB 9.8 (L) 11/16/2013 0847   HCT 22.4 (L) 08/12/2018 0635   HCT 34.1 (L) 03/30/2017 1420   HCT 30.7 (L) 11/16/2013 0847   PLT 206 08/12/2018 0316   PLT 131 (L) 06/10/2018 1001   PLT 287 Large platelets present 03/30/2017 1420   PLT 323 11/16/2013 0847   MCV 84.6 08/12/2018 0316   MCV 86 03/30/2017 1420   MCV 80.3 11/16/2013 0847   MCH 25.6 (L) 08/12/2018 0316   MCHC 30.2 08/12/2018 0316   RDW 19.9 (H) 08/12/2018 0316   RDW 18.1 (H) 03/30/2017 1420   RDW 20.0 (H) 11/16/2013 0847   LYMPHSABS 3.1 08/12/2018 0316   LYMPHSABS 1.4 03/30/2017 1420   MONOABS 11.6 (H) 08/12/2018 0316   EOSABS 0.4 08/12/2018 0316   EOSABS 0.2 03/30/2017 1420   BASOSABS 0.4 (H) 08/12/2018 0316   BASOSABS 0.4 (H) 03/30/2017 1420    CMP sodium 147, BUN 126, creatinine 2.07    Component Value Date/Time   NA 147 (H) 08/12/2018 0316   NA 142 02/28/2018 1202   NA 143 03/30/2017 1420   NA 138 11/16/2013 0847   K 4.0 08/12/2018 0316   K 4.3 03/30/2017 1420   K 4.5 11/16/2013 0847   CL 119 (H) 08/12/2018 0316   CL 110 (H)  03/30/2017 1420   CO2 15 (L) 08/12/2018 0316   CO2 27 03/30/2017 1420   CO2 22 11/16/2013 0847   GLUCOSE 127 (H) 08/12/2018 0316   GLUCOSE 106 03/30/2017 1420   BUN 126 (H) 08/12/2018 0316   BUN 68 (H) 02/28/2018 1202   BUN 38 (H) 03/30/2017 1420   BUN 20.2 11/16/2013 0847   CREATININE 2.07 (H) 08/12/2018 0316   CREATININE 1.41 (H) 06/10/2018 1001   CREATININE 1.3 (H) 03/30/2017 1420   CREATININE 1.2 11/16/2013 0847   CALCIUM 9.5 08/12/2018 0316   CALCIUM 9.8 03/30/2017 1420   CALCIUM 9.8 11/16/2013 0847  PROT 5.6 (L) 08/04/2018 0426   PROT 6.0 (L) 03/30/2017 1420   PROT 6.7 11/16/2013 0847   ALBUMIN 1.6 (L) 08/04/2018 0426   ALBUMIN 2.8 (L) 03/30/2017 1420   ALBUMIN 3.4 (L) 11/16/2013 0847   AST 15 08/04/2018 0426   AST 26 06/10/2018 1001   AST 25 11/16/2013 0847   ALT 10 08/04/2018 0426   ALT 28 06/10/2018 1001   ALT 22 03/30/2017 1420   ALT 11 11/16/2013 0847   ALKPHOS 86 08/04/2018 0426   ALKPHOS 116 (H) 03/30/2017 1420   ALKPHOS 125 11/16/2013 0847   BILITOT 0.4 08/04/2018 0426   BILITOT 0.3 06/10/2018 1001   BILITOT 0.35 11/16/2013 0847   GFRNONAA 30 (L) 08/12/2018 0316   GFRNONAA 48 (L) 06/10/2018 1001   GFRAA 35 (L) 08/12/2018 0316   GFRAA 56 (L) 06/10/2018 1001  Depakote level 24.  Imaging I have reviewed images in epic and the results pertinent to this consultation are: Two noncontrast enhanced MRIs of the brain 9 days apart from 07/28/2018 and 08/06/2018 reviewed and are unremarkable for any acute change  Assessment: 78 year old man with past medical history of myeloproliferative disorder, CHF, coronary artery disease, sleep apnea and recurrent UTIs initially admitted for treatment of complicated UTI, develop encephalopathy, EEG done, treated earlier as cefepime toxicity versus subclinical status epilepticus with Versed and propofol drip.  Sleeth see details of the sedation and the prior notes. His sedation has been off now for nearly 10 days with no change in  his neurological exam. Of note, his renal function continues to be impaired and his white count continues to be significantly higher than his baseline, even with a known history of mild proliferative disorder. Prolonged use of propofol and cefepime toxicity in the setting of deranged renal functions were being blamed for his poor neurological exam but I am unsure if that is the only cause-I think it is more of the renal dysfunction which is causing toxic metabolic encephalopathy at this time.  Impression: Multifactorial toxic metabolic encephalopathy. Presumed status epilepticus that has resolved-EEG pending to ensure no recurrence.  Recommendations: Pending EEG Continue Keppra and Depakote.  Depakote level between 20-30 is reasonable given his albumin. Correction of toxic metabolic derangements per primary team Discussion with hematology regarding his myeloproliferative disorder and how that might be contributing to his current clinical picture per primary team. I will follow with you after the EEG is done    -- Amie Portland, MD [facility] Pager: [phone] If 7pm to 7am, please call on call as listed on AMION.

## 2018-08-12 NOTE — Progress Notes (Signed)
PCCM Interval Note  EEG shows slowing, no evidence of seizures indicative of severe encephalopathy.  Additionally, Hgb post 1 unit of PRBC from 6.8 _>5.8, BP and HR improved after transfusion with ongoing fever, however now is hypotensive and neosynephrine has been started.  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  I have called and spoke with patient's son, [name], given his father's progressive decline, including acute decompensation since yesterday, despite aggressive care, unchanged neurological status with recommendations for him and his sister to consider transition to comfort care.  We discussed his baseline multiple chronic conditions.  I am not sure if he clearly understands the complexity of his father's illness- baseline and now acute.  I offered to answer any questions and he had none at this time.  If family wishes to proceed with full aggressive measures, although patient is likely not to survive, we will need to involve nephrology and oncology and proceed with tracheostomy.     Kennieth Rad, MSN, AGACNP-BC [facility] Pgr: [phone] or if no answer [phone] 08/12/2018, 2:17 PM

## 2018-08-12 NOTE — Progress Notes (Signed)
CRITICAL VALUE STICKER  CRITICAL VALUE: Hgb 6.8  RECEIVER (on-site recipient of call): [place], [place] NOTIFIED: 3/27 0450  MD NOTIFIED: Mechele Collin, RN Warren Lacy)  RESPONSE: Will report to MD

## 2018-08-12 NOTE — Progress Notes (Signed)
[facility] Progress Note Patient Name: Juan [name] DOB: 1940/11/05 MRN: [id number]   Date of Service  08/12/2018  HPI/Events of Note  Diminished urine output with hypernatremia  eICU Interventions  0.45 % normal saline infusion at 100 ml/hr x 1 liter        [name] [name] 08/12/2018, 4:12 AM

## 2018-08-12 NOTE — Progress Notes (Signed)
Asked to call and speak to patient's daughter, [name].  She states that her brother, [name] called, but didn't tell her anything therefore we discussed his underlying health conditions and current conditions.    She states that they have an additional sister and brother, besides [name].  When I spoke to [place] earlier, he did not mention this when specifically asked given NOK.    She states she is in favor of transitioning to comfort care as she states her father would not want that.  She is going to speak to her other sister and brother concerning next steps.  She will let us know.    Given complexity of family, if they can not come to an agreement, might need to involve PMT care to help guide family dynamics.    Kennieth Rad, MSN, AGACNP-BC [facility] Pgr: [phone] or if no answer [phone] 08/12/2018, 4:35 PM

## 2018-08-12 NOTE — Progress Notes (Signed)
SAME DAY PROGRESS NOTE  EEG shows only slowing and no evidence of seizures. This indicates severe encephalopathy.   With 10 days off of sedation, and EEG suggestive of encephalopathy, metabolic causes such as renal failure and uremia should be corrected tosee if there is any clinical change.  We will follow.  -- Amie Portland, MD [facility] Pager: [phone] If 7pm to 7am, please call on call as listed on AMION.

## 2018-08-12 NOTE — Procedures (Signed)
ELECTROENCEPHALOGRAM REPORT   Patient: Juan [name]       Room #: 2H21C EEG No. ID: [id number] Age: 78 y.o.        Sex: male Referring Physician: Nelda Marseille Report Date:  08/12/2018        Interpreting Physician: Alexis Goodell  History: Juan [name] is an 78 y.o. male with altered mental status and history of seizures  Medications:  Neosynephrine, Vimpat, Keppra, Depakote, Pepcid,   Conditions of Recording:  This is a 21 channel routine scalp EEG performed with bipolar and monopolar montages arranged in accordance to the international 10/20 system of electrode placement. One channel was dedicated to EKG recording.  The patient is in the intubated and unsedated state.  Description:  The background activity is slow and very low voltage.  It consists of a polymorphic delta rhythm that is diffusely distributed.  This slow activity is not continuous and there are also noted frequent, short periods of attenuation that are synchronous between both hemispheres, usually lasting no more than one second at a time. Also noted are generalized, intermittent, periodic discharges of triphasic morphology that occur every one to two seconds and are persistent throughout the recording. Despite multiple attempts at stimulation there is no change clinically nor any activation of the recording noted.   Hyperventilation and intermittent photic stimulation were not performed.  IMPRESSION: This is an abnormal electroencephalogram secondary to general background slowing and GPDs of triphasic morphology at a frequency of one every 1-2 seconds.  There is no change in clinical activity during the recording.  Clinical correlation recommended.     Alexis Goodell, MD Neurology [phone] 08/12/2018, 12:17 PM

## 2018-08-12 NOTE — Progress Notes (Signed)
Tylenol administered for temp of 101.58F at 0152. Temp rechecked at 0345 and is 102.36F. ST w/ frequent PACs in the 110s to 120s. B/P stable. UOP dropped to ~83mL/hr for the past 4 hours. AM labs sent. Called [facility] and spoke with [facility], Therapist, sports. Will report to MD. Awaiting orders.

## 2018-08-13 ENCOUNTER — Other Ambulatory Visit: Payer: Self-pay

## 2018-08-13 LAB — CBC
HCT: 25.9 % — ABNORMAL LOW (ref 39.0–52.0)
Hemoglobin: 7.7 g/dL — ABNORMAL LOW (ref 13.0–17.0)
MCH: 24.9 pg — ABNORMAL LOW (ref 26.0–34.0)
MCHC: 29.7 g/dL — ABNORMAL LOW (ref 30.0–36.0)
MCV: 83.8 fL (ref 80.0–100.0)
Platelets: 201 10*3/uL (ref 150–400)
RBC: 3.09 MIL/uL — AB (ref 4.22–5.81)
RDW: 19.3 % — ABNORMAL HIGH (ref 11.5–15.5)
WBC: 44.4 10*3/uL — ABNORMAL HIGH (ref 4.0–10.5)
nRBC: 1.5 % — ABNORMAL HIGH (ref 0.0–0.2)

## 2018-08-13 LAB — TYPE AND SCREEN
ABO/RH(D): O POS
Antibody Screen: NEGATIVE
UNIT DIVISION: 0
Unit division: 0
Unit division: 0

## 2018-08-13 LAB — BPAM RBC
Blood Product Expiration Date: 202003272359
Blood Product Expiration Date: 202004032359
Blood Product Expiration Date: 202004172359
ISSUE DATE / TIME: 202003270640
ISSUE DATE / TIME: 202003250832
ISSUE DATE / TIME: 202003271332
Unit Type and Rh: 5100
Unit Type and Rh: 5100
Unit Type and Rh: 9500

## 2018-08-13 LAB — BASIC METABOLIC PANEL
ANION GAP: 13 (ref 5–15)
BUN: 135 mg/dL — AB (ref 8–23)
CO2: 13 mmol/L — ABNORMAL LOW (ref 22–32)
Calcium: 8.3 mg/dL — ABNORMAL LOW (ref 8.9–10.3)
Chloride: 117 mmol/L — ABNORMAL HIGH (ref 98–111)
Creatinine, Ser: 2.47 mg/dL — ABNORMAL HIGH (ref 0.61–1.24)
GFR calc Af Amer: 28 mL/min — ABNORMAL LOW (ref >60)
GFR calc non Af Amer: 24 mL/min — ABNORMAL LOW (ref >60)
Glucose, Bld: 108 mg/dL — ABNORMAL HIGH (ref 70–99)
POTASSIUM: 4 mmol/L (ref 3.5–5.1)
Sodium: 143 mmol/L (ref 135–145)

## 2018-08-13 LAB — GLUCOSE, CAPILLARY
GLUCOSE-CAPILLARY: 98 mg/dL (ref 70–99)
Glucose-Capillary: 106 mg/dL — ABNORMAL HIGH (ref 70–99)
Glucose-Capillary: 106 mg/dL — ABNORMAL HIGH (ref 70–99)
Glucose-Capillary: 108 mg/dL — ABNORMAL HIGH (ref 70–99)
Glucose-Capillary: 110 mg/dL — ABNORMAL HIGH (ref 70–99)
Glucose-Capillary: 124 mg/dL — ABNORMAL HIGH (ref 70–99)

## 2018-08-13 LAB — VALPROIC ACID LEVEL: Valproic Acid Lvl: 16 ug/mL — ABNORMAL LOW (ref 50.0–100.0)

## 2018-08-13 LAB — MAGNESIUM: Magnesium: 2.1 mg/dL (ref 1.7–2.4)

## 2018-08-13 MED ORDER — METOPROLOL TARTRATE 5 MG/5ML IV SOLN
INTRAVENOUS | Status: AC
  Filled 2018-08-13: qty 5

## 2018-08-13 MED ORDER — AMIODARONE HCL IN DEXTROSE 360-4.14 MG/200ML-% IV SOLN
60.0000 mg/h | INTRAVENOUS | Status: AC
  Administered 2018-08-13: 60 mg/h via INTRAVENOUS
  Filled 2018-08-13: qty 200

## 2018-08-13 MED ORDER — AMIODARONE IV BOLUS ONLY 150 MG/100ML
150.0000 mg | Freq: Once | INTRAVENOUS | Status: AC
  Administered 2018-08-13: 150 mg via INTRAVENOUS
  Filled 2018-08-13: qty 100

## 2018-08-13 MED ORDER — AMIODARONE HCL IN DEXTROSE 360-4.14 MG/200ML-% IV SOLN
30.0000 mg/h | INTRAVENOUS | Status: DC
  Administered 2018-08-13 – 2018-08-20 (×15): 30 mg/h via INTRAVENOUS
  Filled 2018-08-13 (×15): qty 200

## 2018-08-13 MED ORDER — PHENYLEPHRINE HCL-NACL 40-0.9 MG/250ML-% IV SOLN
0.0000 ug/min | INTRAVENOUS | Status: DC
  Administered 2018-08-13: 80 ug/min via INTRAVENOUS
  Administered 2018-08-14 (×2): 60 ug/min via INTRAVENOUS
  Administered 2018-08-15: 70 ug/min via INTRAVENOUS
  Administered 2018-08-15: 80 ug/min via INTRAVENOUS
  Filled 2018-08-13 (×5): qty 250

## 2018-08-13 NOTE — Progress Notes (Signed)
NAME:  Juan [name], MRN:  [id number], DOB:  [birth year], LOS: 36 ADMISSION DATE:  07/17/2018, CONSULTATION DATE:  3/13 REFERRING MD:  Lupita Leash, CHIEF COMPLAINT:  Acute encephalopathy and worsening mental status    Brief History   78 year old male patient admitted initially on 3/7 with sepsis secondary secondary to pseudomonas urinary tract infection with associated acute on chronic renal failure secondary to obstructive uropathy.  Over the course of his hospitalization developed progressive encephalopathy, with concern for nonconvulsive status on 3/12.  This was felt possibly secondary to neurotoxicity from cefepime.  Intubated for airway protection. He was initially under propofol, and transition to Versed due to severe bradycardia on 3/16, Versed was stopped 3/17  Past Medical History  Chronic urinary retention with prostate hypertrophy, prior Pseudomonas urinary tract infections, stage III chronic kidney disease, moderate to severe pulmonary hypertension, chronic diastolic heart failure, myelodysplasia with myelodysplastic syndrome, chronic back pain  Significant Hospital Events   3/7: Admitted with presumptive diagnosis of urinary tract infection 3/9: worsening renal failure, seen by urology, Foley catheter placed 3/12: Worsening mental status, EEG worrisome for possible nonconvulsive status.  Felt possibly due to neurotoxicity from cefepime this was discontinued and Zosyn was started.  lumbar puncture performed, for possible meningitis.  Infectious disease consulted, neurology consulted, started on Keppra, moved to [facility] for continuous EEG monitoring 3/13 remains unresponsive, developed worsening hypoxia staph from prior LP unremarkable without evidence of meningitis.  Pulmonary asked to evaluate 3/17 Episode of bradycardia overnight in spite of stopping propofol hence dopamine started. Episode of mucus plugging 3/17 versed stopped 3/24>> No improvement in neuro status despite off sedation s 3/26 >>  overnight with low hgb s/p transfusion, Afib w/RVR, hypotensive requiring pressors, oliguria 3/28 -A Fib RVR -Amio bolus x 1 , weaning pressors   Consults:  ID Neurology Urology PCCM  Procedures:  3/12 Fluoro-guided LP performed at L3-L4. Opening pressure 12 cm H20. 13.5 ml clear CSF obtained 3/13 ETT >> 3/17 R DL PICC >>  Significant Diagnostic Tests:  CT abdomen pelvis 3/7 showed bilateral hydronephrosis with bladder wall thickening and multiple bladder diverticuli suggesting chronic bladder outlet obstruction with cystitis MRI brain 3/12: Truncated and motion degraded examination without visible acute abnormality.  MRI L spine 3/12:1. Mild foraminal narrowing bilaterally at L4-5 is worse on the left.2. Chronic endplate changes at Q9-4 with associated Schmorl's node. 3. Minimal rightward disc bulging without significant stenosis.4. 10 mm solid lesion at the upper pole of the right kidney was not well seen on recent CT scans. Small renal cell carcinoma is not excluded. Recommend dedicated MRI of the kidneys as the patient's condition allows. 5. Stable cyst at the lower pole of the right kidney.  EEG 3/12 frequent periodic discharges concerning for triphasic versus epileptiform GPD's 3/17 EEG >>burst suppression, triphasics GPD's US abdomen 3/12: 1. No acute findings. 2. Single visualized gallstone. No evidence of acute cholecystitis. No other abnormality.  MRI brain 3/21 neg  CT Head 3/24 No acute infarct, no hemorrhage no edema. Small lipoma between lateral ventricles, mild periventricular small vessel disease  CXR 3/25 Moderate pulmonary vascular congestion, low lung volumes.   CXR 3/27 stable ETT/ gastric; bilateral LL atelectasis, multiple lucent areas in the bony structures raise the concern for possible degree of underlying multiple myeloma  Micro Data:  UC 3/7 pseudomonas (cipro resistant) Urine cx 3/12 >> pseudomonas BCx2 3/8>>> neg BCX2 3/12>>>neg CSF 3/12:  neg resp culture 3/13>>>nml flora 3/16 urine >> pseudomonas resp 3/18 >> providencia, multidrug-resistant (S ceftriaxone) 3/25  urine cultures negative 3/25 trach asp >> normal flora 3/25 BC x 2 >> ngtd x 2  Antimicrobials:  Cefepime 3/7>>3/12 Zosyn 3/12>>> 3/19 zyvox 3/13>>>3/15 ceftx 3/21 (HCAP ) >> 3/27  Interim history/subjective:  Fever this am 101.2 , A Fib RVR with rate 140-160  Weaning pressors . Remains unresponsive-no change in mental status .    Objective   Blood pressure (!) 110/52, pulse (!) 103, temperature (!) 101.2 F (38.4 C), resp. rate (!) 21, height _0  (1.905 m), weight 99 kg, SpO2 96 %.    Vent Mode: PRVC FiO2 (%):  [40 %] 40 % Set Rate:  [16 bmp] 16 bmp Vt Set:  [670 mL] 670 mL PEEP:  [5 cmH20] 5 cmH20 Plateau Pressure:  [14 cmH20-18 cmH20] 16 cmH20   Intake/Output Summary (Last 24 hours) at 08/13/2018 0800 Last data filed at 08/13/2018 0700 Gross per 24 hour  Intake 3241.42 ml  Output 1064 ml  Net 2177.42 ml   Filed Weights   08/11/18 0400 08/12/18 0500 08/13/18 0500  Weight: 92.8 kg 95.9 kg 99 kg   Examination:  General: Ill-appearing male unresponsive on ventilator  HEENT: ETT/OGT, anicteric  Neuro: Unresponsive, no response to noxious stimuli  CV: Irregular irregular, A. fib with RVR  PULM: Bilateral breath sounds on ventilator, diminished in the bases  GI: Soft, bowel sounds positive, Flexi-Seal, Foley  Extremities: Warm, dry, positive anasarca    Skin: No rash  Resolved Hospital Problem list   Transient bradycardia-due to propofol, resolved Hyperchloremia and Non-anion gap metabolic acidosis, resolved Hypertension  Assessment & Plan:   Acute Toxic /Metabolic Encephalopathy  Presumed nonconvulsive status +/- cefepime neurotoxicity S/p 8 hours of burst suppression on 3/16, all sedation off since 3/17, has since remained comatose EEG 3/27 abnormal EEG with general background slowing, no evidence of seizures, indicative of severe  encephalopathy.  Plan Appreciate neurology's input Continue current AED per Neurology- keppra/ depakote/ vimpat Ongoing neuro exams Prn ativan for seizure  Holding all sedation Appears to be a poor prognosis   Acute hypoxic respiratory failure 2/2 evolving pulmonary infiltrates and atx super imposed on underlying severe PH and OSA - intermittent episodes of mucous plugging reported - CXR 3/27 stable apparatus's, ongoing lower lobe atelectasis  Plan mental status precludes extubation/ not stable today for PSV trials Day 15 of ETT, if we do not move to palliative care, will need trach  Hypotension - likely related to rapid Afib/ RVR, slow progression of anemia and third spacing P:   Transfuse for Hgb <7 Wean Neo for MAP >65   New Onset Afib- now SR post transfusion - possibly related to anemia and fever  3/28 A Fib RVR w/ HR 140-160 - remained in RVR after bolus   P:  Tele monitoring Fever control Amio 186m bolus x 1 and begin infusion   Acute on chronic renal failure (st III CKD) 2/2 chronic urinary retention w/ h/o poorly functioning bladder, enlarged prostate and mild to mod obstruction. Has had multiple recurrent UTIs. Has not completed prior treatments in past d/t financial restraints.  Solid lesion on upper pole of right kidney.  Hypokalemia Hypernatremia   Lab Results  Component Value Date   CREATININE 2.07 (H) 08/12/2018   CREATININE 2.04 (H) 08/11/2018   CREATININE 2.02 (H) 08/10/2018   CREATININE 1.41 (H) 06/10/2018   CREATININE 1.48 (H) 04/08/2018   CREATININE 1.50 (H) 03/02/2018   CREATININE 1.3 (H) 03/30/2017   CREATININE 1.3 (H) 07/30/2014   CREATININE 1.2 11/16/2013  Recent Labs  Lab 08/10/18 0333 08/11/18 0333 08/12/18 0316  K 4.0 3.6 4.0   Recent Labs  Lab 08/10/18 0333 08/11/18 0333 08/12/18 0316  NA 153* 150* 147*   Plan Worsening uremia/ AKI- ?related to progressive MDS Avoid nephrotoxins Monitor electrolytes and renal  function Continue free water  Providencia HCAP with new infiltrate Complicated Pseudomonas Urinary tract infection in setting of obstructive uropathy-chronic Foley catheter, colonizer per ID, treated with 10 days of antibiotics total Ongoing severe leukocytosis- 60-50's WBC Ongoing fever Plan Finishing 7 days of Ceftriaxone 3/27 Following culture data from 3/25  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 - Not been treated due to poor performance status, last oncology visit 06/10/2018 CT abdomen imaging also noting innumerable lytic lesions involving the spine Leukocytosis- baseline 22k range Fever -CXR 3/27 showing multiple lucent areas raising concern for underlying process/ ?multiple myeloma Plan Pending EEG/ neurology recs, if we do not transition to comfort care will need hematology consult today for further recs Trending CBC  Iron def anemia Symptomatic anemia S/p 1 u PRBC 3/27 Plan Transfuse for Hgb < 7 Observe for bleeding- currently no signs    Global:  Given no neurological improvement, off all sedation since 3/17, with worsening renal function, and evidence of progression of myelodysplastic syndrome / myelofibrosis, EEG with severe encephalopathy,. Family discussion for very poor prognosis .   Best practice:  Diet: NPO, TF Pain/Anxiety/Delirium protocol (if indicated): Off all sedation since 3/17 VAP protocol (if indicated): 3/13 DVT prophylaxis: SCDs, hold heparin  GI prophylaxis: H2B Glucose control: ssi Mobilitity BR Code Status: full code  Family Communication: discussion with [name] daughter 3/28, family meeting today with her and sibling regarding prognosis and plan of care .  Disposition:  ICU    Nixxon Faria NP-C  [facility]  [phone]  08/13/2018

## 2018-08-13 NOTE — Progress Notes (Signed)
08/13/2018 Family discussion via video conference in patient room with Neurology , RN and myself present .  After Neurology spoke with family . I answered questions and updated on current status and advised of poor prognosis  (prolonged hospitalization, multi organ failure with probable myeloproliferative disorder. )  Want to proceed with aggressive measures .  Did discuss need to transition to [place] going forward if continue with aggressive measures . Discussed comfort measures , family declined at this time.   Amous Crewe NP-C  [facility]  [phone]  08/13/2018

## 2018-08-13 NOTE — Progress Notes (Signed)
Neurology Progress Note   S:// Patient seen and examined.  No change in exam per RN. Continues to be excessively tachycardic.  O:// Current vital signs: BP (!) 110/52   Pulse (!) 103   Temp (!) 101.2 F (38.4 C)   Resp (!) 21   Ht _0  (1.905 m)   Wt 99 kg   SpO2 96%   BMI 27.28 kg/m  Vital signs in last 24 hours: Temp:  [98.2 F (36.8 C)-102.7 F (39.3 C)] 101.2 F (38.4 C) (03/28 0700) Pulse Rate:  [82-126] 103 (03/28 0700) Resp:  [18-26] 21 (03/28 0700) BP: (80-137)/(44-74) 110/52 (03/28 0700) SpO2:  [93 %-100 %] 96 % (03/28 0700) FiO2 (%):  [40 %] 40 % (03/28 0442) Weight:  [99 kg] 99 kg (03/28 0500) Essentially unchanged neurological exam from yesterday. Mental status: Intubated ventilated on no sedation Does not respond to verbal stimulation at all-does not open eyes Does not respond to noxious stimulation today-does not open eyes today as he did on prior few occasions. Cranial exam: Pupils equal round reactive light, corneal reflex present bilaterally, face symmetric difficult to ascertain due to the tube. Motor exam: No spontaneous movements observed.  No response to noxious simulation. Flaccid tone in all 4 extremities.  Nonpitting edema in both upper and lower extremities. Sensory exam: As above DTRs absent throughout  Medications  Current Facility-Administered Medications:  .  0.9 %  sodium chloride infusion (Manually program via Guardrails IV Fluids), , Intravenous, Once, Ogan, Okoronkwo U, MD .  0.9 %  sodium chloride infusion, , Intravenous, Continuous, Byrum, Rose Fillers, MD, Last Rate: 10 mL/hr at 08/13/18 0700 .  acetaminophen (TYLENOL) solution 650 mg, 650 mg, Per Tube, Q6H PRN, Anders Simmonds, MD, 650 mg at 08/13/18 0109 .  albuterol (PROVENTIL) (2.5 MG/3ML) 0.083% nebulizer solution 2.5 mg, 2.5 mg, Nebulization, Q6H PRN, Rigoberto Noel, MD, 2.5 mg at 08/10/18 1547 .  amiodarone (NEXTERONE) IV bolus only 150 mg/100 mL, 150 mg, Intravenous, Once,  Parrett, Tammy S, NP, Last Rate: 600 mL/hr at 08/13/18 0808, 150 mg at 08/13/18 0808 .  chlorhexidine gluconate (MEDLINE KIT) (PERIDEX) 0.12 % solution 15 mL, 15 mL, Mouth Rinse, BID, Salvadore Dom E, NP, 15 mL at 08/12/18 1941 .  Chlorhexidine Gluconate Cloth 2 % PADS 6 each, 6 each, Topical, Daily, Rush Farmer, MD, 6 each at 08/12/18 1333 .  clotrimazole (LOTRIMIN) 1 % cream 1 application, 1 application, Topical, BID, Erick Colace, NP, 1 application at 32/35/57 2202 .  famotidine (PEPCID) 40 MG/5ML suspension 20 mg, 20 mg, Per Tube, Daily, Rush Farmer, MD, 20 mg at 08/12/18 1048 .  feeding supplement (PRO-STAT SUGAR FREE 64) liquid 30 mL, 30 mL, Per Tube, BID, Rigoberto Noel, MD, 30 mL at 08/12/18 2200 .  feeding supplement (VITAL AF 1.2 CAL) liquid 1,000 mL, 1,000 mL, Per Tube, Continuous, Rigoberto Noel, MD, Last Rate: 60 mL/hr at 08/10/18 2351, 1,000 mL at 08/10/18 2351 .  free water 400 mL, 400 mL, Per Tube, Q4H, Byrum, Rose Fillers, MD, 400 mL at 08/13/18 0615 .  hydrALAZINE (APRESOLINE) injection 10 mg, 10 mg, Intravenous, Q6H PRN, Eliezer Bottom T, MD, 10 mg at 08/05/18 1227 .  hydrocortisone cream 1 % 1 application, 1 application, Topical, BID, Erick Colace, NP, 1 application at 32/20/25 2205 .  lacosamide (VIMPAT) tablet 200 mg, 200 mg, Per Tube, BID, Rush Farmer, MD, 200 mg at 08/12/18 2200 .  levETIRAcetam (KEPPRA) 100 MG/ML solution  500 mg, 500 mg, Per Tube, BID, Rigoberto Noel, MD, 500 mg at 08/12/18 2229 .  LORazepam (ATIVAN) injection 1 mg, 1 mg, Intravenous, Q4H PRN, Erick Colace, NP .  MEDLINE mouth rinse, 15 mL, Mouth Rinse, 10 times per day, Erick Colace, NP, 15 mL at 08/13/18 0615 .  phenylephrine (NEOSYNEPHRINE) 10-0.9 MG/250ML-% infusion, 0-400 mcg/min, Intravenous, Titrated, Ogan, Okoronkwo U, MD, Last Rate: 15 mL/hr at 08/13/18 0700, 10 mcg/min at 08/13/18 0700 .  valproic acid (DEPAKENE) solution 750 mg, 750 mg, Per Tube, Q12H, Amie Portland, MD,  750 mg at 08/13/18 0617 Labs CBC    Component Value Date/Time   WBC 44.4 (H) 08/13/2018 0533   RBC 3.09 (L) 08/13/2018 0533   HGB 7.7 (L) 08/13/2018 0533   HGB 9.7 (L) 06/10/2018 1001   HGB 10.7 (L) 03/30/2017 1420   HGB 9.8 (L) 11/16/2013 0847   HCT 25.9 (L) 08/13/2018 0533   HCT 34.1 (L) 03/30/2017 1420   HCT 30.7 (L) 11/16/2013 0847   PLT 201 08/13/2018 0533   PLT 131 (L) 06/10/2018 1001   PLT 287 Large platelets present 03/30/2017 1420   PLT 323 11/16/2013 0847   MCV 83.8 08/13/2018 0533   MCV 86 03/30/2017 1420   MCV 80.3 11/16/2013 0847   MCH 24.9 (L) 08/13/2018 0533   MCHC 29.7 (L) 08/13/2018 0533   RDW 19.3 (H) 08/13/2018 0533   RDW 18.1 (H) 03/30/2017 1420   RDW 20.0 (H) 11/16/2013 0847   LYMPHSABS 3.1 08/12/2018 0316   LYMPHSABS 1.4 03/30/2017 1420   MONOABS 11.6 (H) 08/12/2018 0316   EOSABS 0.4 08/12/2018 0316   EOSABS 0.2 03/30/2017 1420   BASOSABS 0.4 (H) 08/12/2018 0316   BASOSABS 0.4 (H) 03/30/2017 1420    CMP     Component Value Date/Time   NA 143 08/13/2018 0721   NA 142 02/28/2018 1202   NA 143 03/30/2017 1420   NA 138 11/16/2013 0847   K 4.0 08/13/2018 0721   K 4.3 03/30/2017 1420   K 4.5 11/16/2013 0847   CL 117 (H) 08/13/2018 0721   CL 110 (H) 03/30/2017 1420   CO2 13 (L) 08/13/2018 0721   CO2 27 03/30/2017 1420   CO2 22 11/16/2013 0847   GLUCOSE 108 (H) 08/13/2018 0721   GLUCOSE 106 03/30/2017 1420   BUN 135 (H) 08/13/2018 0721   BUN 68 (H) 02/28/2018 1202   BUN 38 (H) 03/30/2017 1420   BUN 20.2 11/16/2013 0847   CREATININE 2.47 (H) 08/13/2018 0721   CREATININE 1.41 (H) 06/10/2018 1001   CREATININE 1.3 (H) 03/30/2017 1420   CREATININE 1.2 11/16/2013 0847   CALCIUM 8.3 (L) 08/13/2018 0721   CALCIUM 9.8 03/30/2017 1420   CALCIUM 9.8 11/16/2013 0847   PROT 5.6 (L) 08/04/2018 0426   PROT 6.0 (L) 03/30/2017 1420   PROT 6.7 11/16/2013 0847   ALBUMIN 1.6 (L) 08/04/2018 0426   ALBUMIN 2.8 (L) 03/30/2017 1420   ALBUMIN 3.4 (L)  11/16/2013 0847   AST 15 08/04/2018 0426   AST 26 06/10/2018 1001   AST 25 11/16/2013 0847   ALT 10 08/04/2018 0426   ALT 28 06/10/2018 1001   ALT 22 03/30/2017 1420   ALT 11 11/16/2013 0847   ALKPHOS 86 08/04/2018 0426   ALKPHOS 116 (H) 03/30/2017 1420   ALKPHOS 125 11/16/2013 0847   BILITOT 0.4 08/04/2018 0426   BILITOT 0.3 06/10/2018 1001   BILITOT 0.35 11/16/2013 0847   GFRNONAA 24 (L) 08/13/2018 2130  GFRNONAA 48 (L) 06/10/2018 1001   GFRAA 28 (L) 08/13/2018 0721   GFRAA 56 (L) 06/10/2018 1001   Imaging I have reviewed images in epic and the results pertinent to this consultation are: Noncontrast MRIs of the brain on 07/28/2018 08/06/2018 are unremarkable for any acute process.  EEG yesterday consistent with encephalopathy.  No seizures.  Assessment: 78 year old man past medical history of myeloproliferative disorder, CHF, coronary disease, sleep apnea and recurrent UTIs initially admitted for treatment of complicated UTI, developed encephalopathy, EEG done and treated as cefepime toxicity versus subclinical status epilepticus with Versed and propofol drip, who has now been off of sedation for nearly 10 days with no changes neurological examination. His renal function continues to be impaired and white cell count has been significantly higher than baseline. Initially, prolonged use of propofol/Versed and cefepime toxicity were being blamed for his poor muscular exam but I think that that is not the only cause for his encephalopathy and his deranged renal function probably has the most important role in his encephalopathy.   Impression: #Toxic metabolic encephalopathy-multifactorial-impaired renal function/uremia, prolonged sedation with Versed and propofol which was discontinued 10 days ago. #Presumed status epilepticus -- resolved #Presumed cefepime toxicity-also resolved given EEG findings  Recommendations: Continue Keppra and Depakote Depakote level is reasonable to be  between 20 and 30 Depakote level today 16.  Will work with pharmacy-change the dose to 750 in the a.m. and 1000 at night. Management of renal failure per primary team Management of myeloproliferative disorder per primary team Family talks are underway regarding further goals of care.  I will be happy to speak with family if needed. We will be available as needed.   -- Amie Portland, MD [facility] Pager: [phone] If 7pm to 7am, please call on call as listed on AMION.

## 2018-08-14 ENCOUNTER — Inpatient Hospital Stay (HOSPITAL_COMMUNITY): Payer: Medicare Other

## 2018-08-14 LAB — CBC
HCT: 25.4 % — ABNORMAL LOW (ref 39.0–52.0)
Hemoglobin: 7.5 g/dL — ABNORMAL LOW (ref 13.0–17.0)
MCH: 24.7 pg — ABNORMAL LOW (ref 26.0–34.0)
MCHC: 29.5 g/dL — ABNORMAL LOW (ref 30.0–36.0)
MCV: 83.6 fL (ref 80.0–100.0)
Platelets: 198 10*3/uL (ref 150–400)
RBC: 3.04 MIL/uL — ABNORMAL LOW (ref 4.22–5.81)
RDW: 19.7 % — AB (ref 11.5–15.5)
WBC: 49.1 10*3/uL — AB (ref 4.0–10.5)
nRBC: 1 % — ABNORMAL HIGH (ref 0.0–0.2)

## 2018-08-14 LAB — GLUCOSE, CAPILLARY
GLUCOSE-CAPILLARY: 105 mg/dL — AB (ref 70–99)
Glucose-Capillary: 112 mg/dL — ABNORMAL HIGH (ref 70–99)
Glucose-Capillary: 116 mg/dL — ABNORMAL HIGH (ref 70–99)
Glucose-Capillary: 111 mg/dL — ABNORMAL HIGH (ref 70–99)
Glucose-Capillary: 110 mg/dL — ABNORMAL HIGH (ref 70–99)

## 2018-08-14 LAB — BASIC METABOLIC PANEL
Anion gap: 15 (ref 5–15)
BUN: 159 mg/dL — ABNORMAL HIGH (ref 8–23)
CALCIUM: 8.6 mg/dL — AB (ref 8.9–10.3)
CO2: 12 mmol/L — ABNORMAL LOW (ref 22–32)
Chloride: 112 mmol/L — ABNORMAL HIGH (ref 98–111)
Creatinine, Ser: 3.21 mg/dL — ABNORMAL HIGH (ref 0.61–1.24)
GFR calc Af Amer: 21 mL/min — ABNORMAL LOW (ref >60)
GFR, EST NON AFRICAN AMERICAN: 18 mL/min — AB (ref >60)
Glucose, Bld: 114 mg/dL — ABNORMAL HIGH (ref 70–99)
Potassium: 4.5 mmol/L (ref 3.5–5.1)
Sodium: 139 mmol/L (ref 135–145)

## 2018-08-14 LAB — VALPROIC ACID LEVEL: Valproic Acid Lvl: 21 ug/mL — ABNORMAL LOW (ref 50.0–100.0)

## 2018-08-14 MED ORDER — VALPROIC ACID 250 MG PO CAPS: 1000.0000 mg | ORAL_CAPSULE | Freq: Every day | ORAL | Status: DC

## 2018-08-14 MED ORDER — PIPERACILLIN-TAZOBACTAM 3.375 G IVPB
3.3750 g | Freq: Three times a day (TID) | INTRAVENOUS | Status: DC
  Administered 2018-08-14 – 2018-08-15 (×4): 3.375 g via INTRAVENOUS
  Filled 2018-08-14 (×4): qty 50

## 2018-08-14 MED ORDER — VALPROIC ACID 250 MG/5ML PO SOLN
750.0000 mg | Freq: Every morning | ORAL | Status: DC
  Administered 2018-08-15 – 2018-08-20 (×5): 750 mg
  Filled 2018-08-14 (×7): qty 15

## 2018-08-14 MED ORDER — VALPROIC ACID 250 MG/5ML PO SOLN
1000.0000 mg | Freq: Every day | ORAL | Status: DC
  Administered 2018-08-14 – 2018-08-17 (×4): 1000 mg
  Filled 2018-08-14 (×4): qty 20

## 2018-08-14 MED ORDER — SODIUM BICARBONATE 8.4 % IV SOLN
INTRAVENOUS | Status: DC
  Administered 2018-08-14 – 2018-08-16 (×4): via INTRAVENOUS
  Filled 2018-08-14 (×5): qty 100

## 2018-08-14 NOTE — Progress Notes (Signed)
NAME:  Nirvaan Frett, MRN:  [id number], DOB:  08/31/1940, LOS: 21 ADMISSION DATE:  07/22/2018, CONSULTATION DATE:  3/13 REFERRING MD:  Lupita Leash, CHIEF COMPLAINT:  Acute encephalopathy and worsening mental status    Brief History   78 year old male patient admitted initially on 3/7 with sepsis secondary secondary to pseudomonas urinary tract infection with associated acute on chronic renal failure secondary to obstructive uropathy.  Over the course of his hospitalization developed progressive encephalopathy, with concern for nonconvulsive status on 3/12.  This was felt possibly secondary to neurotoxicity from cefepime.  Intubated for airway protection. He was initially under propofol, and transition to Versed due to severe bradycardia on 3/16, Versed was stopped 3/17  Past Medical History  Chronic urinary retention with prostate hypertrophy, prior Pseudomonas urinary tract infections, stage III chronic kidney disease, moderate to severe pulmonary hypertension, chronic diastolic heart failure, myelodysplasia with myelodysplastic syndrome, chronic back pain  Significant Hospital Events   3/7: Admitted with presumptive diagnosis of urinary tract infection 3/9: worsening renal failure, seen by urology, Foley catheter placed 3/12: Worsening mental status, EEG worrisome for possible nonconvulsive status.  Felt possibly due to neurotoxicity from cefepime this was discontinued and Zosyn was started.  lumbar puncture performed, for possible meningitis.  Infectious disease consulted, neurology consulted, started on Keppra, moved to [facility] for continuous EEG monitoring 3/13 remains unresponsive, developed worsening hypoxia staph from prior LP unremarkable without evidence of meningitis.  Pulmonary asked to evaluate 3/17 Episode of bradycardia overnight in spite of stopping propofol hence dopamine started. Episode of mucus plugging 3/17 versed stopped 3/24>> No improvement in neuro status despite off sedation s 3/26 >>  overnight with low hgb s/p transfusion, Afib w/RVR, hypotensive requiring pressors, oliguria 3/28 -A Fib RVR -Amio bolus x 1 , weaning pressors   Consults:  ID Neurology Urology PCCM  Procedures:  3/12 Fluoro-guided LP performed at L3-L4. Opening pressure 12 cm H20. 13.5 ml clear CSF obtained 3/13 ETT >> 3/17 R DL PICC >>  Significant Diagnostic Tests:  CT abdomen pelvis 3/7 showed bilateral hydronephrosis with bladder wall thickening and multiple bladder diverticuli suggesting chronic bladder outlet obstruction with cystitis  Renal US 3/10  resolved bilateral hydronephrosis   MRI brain 3/12: Truncated and motion degraded examination without visible acute abnormality.  MRI L spine 3/12:1. Mild foraminal narrowing bilaterally at L4-5 is worse on the left.2. Chronic endplate changes at A4-1 with associated Schmorl's node. 3. Minimal rightward disc bulging without significant stenosis.4. 10 mm solid lesion at the upper pole of the right kidney was not well seen on recent CT scans. Small renal cell carcinoma is not excluded. Recommend dedicated MRI of the kidneys as the patient's condition allows. 5. Stable cyst at the lower pole of the right kidney.  EEG 3/12 frequent periodic discharges concerning for triphasic versus epileptiform GPD's 3/17 EEG >>burst suppression, triphasics GPD's US abdomen 3/12: 1. No acute findings. 2. Single visualized gallstone. No evidence of acute cholecystitis. No other abnormality.  MRI brain 3/21 neg  CT Head 3/24 No acute infarct, no hemorrhage no edema. Small lipoma between lateral ventricles, mild periventricular small vessel disease  CXR 3/25 Moderate pulmonary vascular congestion, low lung volumes.   CXR 3/27 stable ETT/ gastric; bilateral LL atelectasis, multiple lucent areas in the bony structures raise the concern for possible degree of underlying multiple myeloma  Micro Data:  UC 3/7 pseudomonas (cipro resistant) Urine cx 3/12 >>  pseudomonas BCx2 3/8>>> neg BCX2 3/12>>>neg CSF 3/12: neg resp culture 3/13>>>nml flora 3/16 urine >>  pseudomonas resp 3/18 >> providencia, multidrug-resistant (S ceftriaxone) 3/25 urine cultures negative 3/25 trach asp >> normal flora 3/25 BC x 2 >> ngtd x 2 3/29 Trach asp>>  Antimicrobials:  Cefepime 3/7>>3/12 Zosyn 3/12>>> 3/19 zyvox 3/13>>>3/15 ceftx 3/21 (HCAP ) >> 3/27 Zosyn 3/29 >>  Interim history/subjective:  Fevers persist, CXR shows increased congestion in lingula   Weaning pressors .   Remains unresponsive , no change in mental status   Family discussion 3/28 , continue aggressive measures   Renal function not improving , . 22L positive since admission , wt up 36 lbs since admission ,  UOP very poor -Nephrology consult pending   Objective   Blood pressure (!) 116/41, pulse 83, temperature 100 F (37.8 C), temperature source Axillary, resp. rate (!) 21, height _0  (1.905 m), weight 99 kg, SpO2 97 %.    Vent Mode: PRVC FiO2 (%):  [40 %] 40 % Set Rate:  [16 bmp] 16 bmp Vt Set:  [670 mL] 670 mL PEEP:  [5 cmH20] 5 cmH20 Pressure Support:  [12 cmH20] 12 cmH20 Plateau Pressure:  [15 cmH20-19 cmH20] 18 cmH20   Intake/Output Summary (Last 24 hours) at 08/14/2018 1303 Last data filed at 08/14/2018 1200 Gross per 24 hour  Intake 3038.19 ml  Output 935 ml  Net 2103.19 ml   Filed Weights   08/12/18 0500 08/13/18 0500 08/14/18 0500  Weight: 95.9 kg 99 kg 99 kg   Examination:  General: Ill-appearing male unresponsive on ventilator  HEENT: ETT/OGT, nonicteric  Neuro: Unresponsive, no response to noxious stimuli  CV: Irregular regular  PULM: Coarse breath sounds bilaterally on the ventilator  GI: Soft bowel sounds positive Flexi-Seal in place, Foley  Extremities: Warm, dry, positive anasarca  Skin: no rash   Resolved Hospital Problem list   Transient bradycardia-due to propofol, resolved Hyperchloremia and Non-anion gap metabolic acidosis, resolved  Hypertension  Assessment & Plan:   Acute Toxic /Metabolic Encephalopathy  Presumed nonconvulsive status +/- cefepime neurotoxicity-resolved on EEG  S/p 8 hours of burst suppression on 3/16, all sedation off since 3/17, has since remained comatose EEG 3/27 abnormal EEG with general background slowing, no evidence of seizures, indicative of severe encephalopathy.  Plan Appreciate neurology's input Continue current AED per Neurology- keppra/ depakote/ vimpat Ongoing neuro exams Prn ativan for seizure  Holding all sedation Appears to be a poor prognosis   Acute hypoxic respiratory failure 2/2 evolving pulmonary infiltrates and atx super imposed on underlying severe PH and OSA - intermittent episodes of mucous plugging reported - CXR 3/29 increased lingular opacity   Plan mental status precludes extubation/ not stable today for PSV trials Day 15 of ETT, if we do not move to palliative care, will need trach.  -culture tracheal aspiratio  -begin empiric abx -Zosyn   Hypotension - likely related to rapid Afib/ RVR, slow progression of anemia and third spacing P:   Transfuse for Hgb <7 Wean Neo for MAP >65   New Onset Afib- now SR post transfusion - possibly related to anemia and fever  -improved on Amio    P:  Tele monitoring Fever control Cont on Amio   Acute on chronic renal failure (st III CKD) 2/2 chronic urinary retention w/ h/o poorly functioning bladder, enlarged prostate and mild to mod obstruction. Has had multiple recurrent UTIs. Has not completed prior treatments in past d/t financial restraints.  Worsening uremia/ AKI- ?related to progressive MDS  Solid lesion on upper pole of right kidney.  Hypokalemia-resolved  Hypernatremia-resolved  Lab Results  Component Value Date   CREATININE 3.21 (H) 08/14/2018   CREATININE 2.47 (H) 08/13/2018   CREATININE 2.07 (H) 08/12/2018   CREATININE 1.41 (H) 06/10/2018   CREATININE 1.48 (H) 04/08/2018   CREATININE 1.50 (H)  03/02/2018   CREATININE 1.3 (H) 03/30/2017   CREATININE 1.3 (H) 07/30/2014   CREATININE 1.2 11/16/2013    Recent Labs  Lab 08/12/18 0316 08/13/18 0721 08/14/18 0347  K 4.0 4.0 4.5   Recent Labs  Lab 08/12/18 0316 08/13/18 0721 08/14/18 0347  NA 147* 143 139   Plan Consult renal  Avoid nephrotoxins Monitor electrolytes and renal function Continue free water  Providencia HCAP with new infiltrate Complicated Pseudomonas Urinary tract infection in setting of obstructive uropathy-chronic Foley catheter, colonizer per ID, treated with 10 days of antibiotics total Ongoing severe leukocytosis- 60-50's WBC Ongoing fever Plan Finishing 7 days of Ceftriaxone 3/27 Following culture data from 3/25 Culture tracheal aspirate  Begin   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   CT abdomen imaging also noting innumerable lytic lesions involving the spine Leukocytosis- baseline 22k range ( ?multiple myeloma- consider hematology consult   Plan Consider Hematology consult tomorrow. Trending CBC  Iron def anemia Symptomatic anemia S/p 1 u PRBC 3/27 Plan Transfuse for Hgb < 7 Observe for bleeding- currently no signs    Global:  Given no neurological improvement, off all sedation since 3/17, with worsening renal function, and evidence of progression of myelodysplastic syndrome / myelofibrosis, EEG with severe encephalopathy,. Family discussion for very poor prognosis .    Best practice:  Diet: NPO, TF Pain/Anxiety/Delirium protocol (if indicated): Off all sedation since 3/17 VAP protocol (if indicated): 3/13 DVT prophylaxis: SCDs, hold heparin  GI prophylaxis: H2B Glucose control: ssi Mobilitity BR Code Status: full code  Family Communication:  3/28, family meeting today with her and siblings regarding poor   prognosis -they want to proceed with aggressive measures  Disposition:  ICU    Ademide Schaberg NP-C  [facility]  [phone]  08/14/2018

## 2018-08-14 NOTE — Progress Notes (Addendum)
Pharmacy Antibiotic Note  Juan [name] is a 78 y.o. male admitted on 08/14/2018 with sepsis 2/2 Pseudomonas UTI, now receiving treatment for Providencia HAP. Has been on ceftriaxone but continues to have fevers and now with worsening congestion on CXR. Pharmacy has been consulted for Zosyn dosing.  Discussed antibiotics with Rexene Edison, NP. Previously isolated Providencia stuartii on TA cx from 3/18 is intermediately susceptible to Zosyn. However, patient received 6 days of ceftriaxone - to which it is susceptible - and TA cx from 3/25 did not grow Providencia. Given history of seizures with cefepime, hesitant to use a carbapenem, which can also lower seizure threshold. Also significant DDI with valproate. Will treat as separate episode of new pneumonia for now with Zosyn. Repeat TA cx from today has been collected.  Plan: Zosyn 3.375g IV q8h  Low threshold to adjust coverage if not improved F/u clinical status, renal function, C&S, LOT  Height: 6\' 3"  (190.5 cm) Weight: 218 lb 4.1 oz (99 kg) IBW/kg (Calculated) : 84.5  Temp (24hrs), Avg:99.8 F (37.7 C), Min:98.6 F (37 C), Max:100.2 F (37.9 C)  Recent Labs  Lab 08/10/18 0333 08/11/18 0333 08/12/18 0316 08/13/18 0533 08/13/18 0721 08/14/18 0347  WBC 56.0* 54.0* 44.8* 44.4*  --  49.1*  CREATININE 2.02* 2.04* 2.07*  --  2.47* 3.21*    Estimated Creatinine Clearance: 23.4 mL/min (A) (by C-G formula based on SCr of 3.21 mg/dL (H)).    Allergies  Allergen Reactions  . Cefepime Other (See Comments)    Seizures   Antimicrobials this admission: CTX 3/7 x1 Cefepime 3/8 >> 3/12 (d/c'd for seizures - SZP completed) Zosyn 3/12 >> 3/14, 3/16 >> 3/19, 3/29 >> Linezolid 3/13 >> 3/16 CTX 3/21 >> 3/27  Microbiology results: 3/8 BCx: NG final 3/7 UCx: >100k Pseudomonas aeruginosa 3/8 MRSA PCR: negative 3/12 CSF cx: NG final 3/12 BCx: NG final 3/12 MRSA PCR: negative 3/12 UCx: Pseudomonas aeruginosa 3/13 TA Cx: normal flora 3/16  UCx: Pseudomonas aeurginosa 3/18 TA Cx: Providencia stuartii 3/20 C. diff: negative 3/25 TA cx: normal flora 3/25 UCx: NGF 3/25 BCx: NG x4 3/29 TA Cx: collected  Thank you for allowing pharmacy to be a part of this patient's care.  Mila Merry Gerarda Fraction, PharmD, [place] PGY2 Infectious Diseases Pharmacy Resident Phone: [phone] 08/14/2018 4:21 PM

## 2018-08-14 NOTE — Progress Notes (Signed)
Neurology Progress Note   S:// Patient seen and examined.  No changes overnight. Detailed discussion with the family yesterday-answered all their questions. Following my discussion, family a discussion with PCCM. Willing to continue aggressive measures.  O:// Current vital signs: BP (!) 113/51   Pulse 81   Temp 99.8 F (37.7 C) (Axillary)   Resp (!) 22   Ht '6\' 3"'$  (1.905 m)   Wt 99 kg   SpO2 99%   BMI 27.28 kg/m  Vital signs in last 24 hours: Temp:  [98.6 F (37 C)-100.2 F (37.9 C)] 99.8 F (37.7 C) (03/29 0919) Pulse Rate:  [77-118] 81 (03/29 0759) Resp:  [18-25] 22 (03/29 0759) BP: (75-124)/(43-64) 113/51 (03/29 0759) SpO2:  [87 %-99 %] 99 % (03/29 0759) FiO2 (%):  [40 %] 40 % (03/29 0759) Weight:  [99 kg] 99 kg (03/29 0500) Remains unchanged from yesterday Mental status: Intubated vented on no sedation.  Does not respond to verbal stimuli or noxious simulation but occasionally would open eyes to deep sternal rub. Nonverbal. Cranial nerves: Pupils equal round react to light, corneal reflexes present bilaterally, breathing over the ventilator, facial symmetry difficult to ascertain due to the tube. Motor exam: Nonpitting edema in both lower extremities with no spontaneous movements observed.  No response to noxious stimulation.  Flaccid in all 4 toes. Sensory exam as above DTRs absent    Medications  Current Facility-Administered Medications:  .  0.9 %  sodium chloride infusion (Manually program via Guardrails IV Fluids), , Intravenous, Once, Ogan, Okoronkwo U, MD .  0.9 %  sodium chloride infusion, , Intravenous, Continuous, Byrum, Rose Fillers, MD, Last Rate: 10 mL/hr at 08/14/18 0700 .  acetaminophen (TYLENOL) solution 650 mg, 650 mg, Per Tube, Q6H PRN, Anders Simmonds, MD, 650 mg at 08/13/18 6734 .  albuterol (PROVENTIL) (2.5 MG/3ML) 0.083% nebulizer solution 2.5 mg, 2.5 mg, Nebulization, Q6H PRN, Rigoberto Noel, MD, 2.5 mg at 08/10/18 1547 .  amiodarone (NEXTERONE  PREMIX) 360-4.14 MG/200ML-% (1.8 mg/mL) IV infusion, 30 mg/hr, Intravenous, Continuous, Parrett, Tammy S, NP, Last Rate: 16.67 mL/hr at 08/14/18 0833, 30 mg/hr at 08/14/18 0833 .  chlorhexidine gluconate (MEDLINE KIT) (PERIDEX) 0.12 % solution 15 mL, 15 mL, Mouth Rinse, BID, Salvadore Dom E, NP, 15 mL at 08/14/18 0741 .  Chlorhexidine Gluconate Cloth 2 % PADS 6 each, 6 each, Topical, Daily, Rush Farmer, MD, 6 each at 08/13/18 1009 .  clotrimazole (LOTRIMIN) 1 % cream 1 application, 1 application, Topical, BID, Erick Colace, NP, 1 application at 19/37/90 [phone] .  famotidine (PEPCID) 40 MG/5ML suspension 20 mg, 20 mg, Per Tube, Daily, Rush Farmer, MD, 20 mg at 08/14/18 0834 .  feeding supplement (PRO-STAT SUGAR FREE 64) liquid 30 mL, 30 mL, Per Tube, BID, Rigoberto Noel, MD, 30 mL at 08/14/18 0834 .  feeding supplement (VITAL AF 1.2 CAL) liquid 1,000 mL, 1,000 mL, Per Tube, Continuous, Rigoberto Noel, MD, Last Rate: 60 mL/hr at 08/10/18 2351, 1,000 mL at 08/10/18 2351 .  free water 400 mL, 400 mL, Per Tube, Q4H, Byrum, Rose Fillers, MD, 400 mL at 08/14/18 0834 .  hydrALAZINE (APRESOLINE) injection 10 mg, 10 mg, Intravenous, Q6H PRN, Eliezer Bottom T, MD, 10 mg at 08/05/18 1227 .  hydrocortisone cream 1 % 1 application, 1 application, Topical, BID, Erick Colace, NP, 1 application at 73/53/29 [phone]  lacosamide (VIMPAT) tablet 200 mg, 200 mg, Per Tube, BID, Rush Farmer, MD, 200 mg at 08/14/18 0834 .  levETIRAcetam (KEPPRA) 100 MG/ML solution 500 mg, 500 mg, Per Tube, BID, Rigoberto Noel, MD, 500 mg at 08/14/18 [phone] .  LORazepam (ATIVAN) injection 1 mg, 1 mg, Intravenous, Q4H PRN, Erick Colace, NP .  MEDLINE mouth rinse, 15 mL, Mouth Rinse, 10 times per day, Erick Colace, NP, 15 mL at 08/14/18 0600 .  phenylephrine (NEOSYNEPHRINE) 40-0.9 MG/250ML-% infusion, 0-400 mcg/min, Intravenous, Titrated, McQuaid, Douglas B, MD, Last Rate: 22.5 mL/hr at 08/14/18 0827, 60 mcg/min at 08/14/18  0827 .  valproic acid (DEPAKENE) solution 750 mg, 750 mg, Per Tube, Q12H, Amie Portland, MD, 750 mg at 08/14/18 0417 Labs CBC    Component Value Date/Time   WBC 49.1 (H) 08/14/2018 0347   RBC 3.04 (L) 08/14/2018 0347   HGB 7.5 (L) 08/14/2018 0347   HGB 9.7 (L) 06/10/2018 1001   HGB 10.7 (L) 03/30/2017 1420   HGB 9.8 (L) 11/16/2013 0847   HCT 25.4 (L) 08/14/2018 0347   HCT 34.1 (L) 03/30/2017 1420   HCT 30.7 (L) 11/16/2013 0847   PLT 198 08/14/2018 0347   PLT 131 (L) 06/10/2018 1001   PLT 287 Large platelets present 03/30/2017 1420   PLT 323 11/16/2013 0847   MCV 83.6 08/14/2018 0347   MCV 86 03/30/2017 1420   MCV 80.3 11/16/2013 0847   MCH 24.7 (L) 08/14/2018 0347   MCHC 29.5 (L) 08/14/2018 0347   RDW 19.7 (H) 08/14/2018 0347   RDW 18.1 (H) 03/30/2017 1420   RDW 20.0 (H) 11/16/2013 0847   LYMPHSABS 3.1 08/12/2018 0316   LYMPHSABS 1.4 03/30/2017 1420   MONOABS 11.6 (H) 08/12/2018 0316   EOSABS 0.4 08/12/2018 0316   EOSABS 0.2 03/30/2017 1420   BASOSABS 0.4 (H) 08/12/2018 0316   BASOSABS 0.4 (H) 03/30/2017 1420    CMP     Component Value Date/Time   NA 139 08/14/2018 0347   NA 142 02/28/2018 1202   NA 143 03/30/2017 1420   NA 138 11/16/2013 0847   K 4.5 08/14/2018 0347   K 4.3 03/30/2017 1420   K 4.5 11/16/2013 0847   CL 112 (H) 08/14/2018 0347   CL 110 (H) 03/30/2017 1420   CO2 12 (L) 08/14/2018 0347   CO2 27 03/30/2017 1420   CO2 22 11/16/2013 0847   GLUCOSE 114 (H) 08/14/2018 0347   GLUCOSE 106 03/30/2017 1420   BUN 159 (H) 08/14/2018 0347   BUN 68 (H) 02/28/2018 1202   BUN 38 (H) 03/30/2017 1420   BUN 20.2 11/16/2013 0847   CREATININE 3.21 (H) 08/14/2018 0347   CREATININE 1.41 (H) 06/10/2018 1001   CREATININE 1.3 (H) 03/30/2017 1420   CREATININE 1.2 11/16/2013 0847   CALCIUM 8.6 (L) 08/14/2018 0347   CALCIUM 9.8 03/30/2017 1420   CALCIUM 9.8 11/16/2013 0847   PROT 5.6 (L) 08/04/2018 0426   PROT 6.0 (L) 03/30/2017 1420   PROT 6.7 11/16/2013 0847    ALBUMIN 1.6 (L) 08/04/2018 0426   ALBUMIN 2.8 (L) 03/30/2017 1420   ALBUMIN 3.4 (L) 11/16/2013 0847   AST 15 08/04/2018 0426   AST 26 06/10/2018 1001   AST 25 11/16/2013 0847   ALT 10 08/04/2018 0426   ALT 28 06/10/2018 1001   ALT 22 03/30/2017 1420   ALT 11 11/16/2013 0847   ALKPHOS 86 08/04/2018 0426   ALKPHOS 116 (H) 03/30/2017 1420   ALKPHOS 125 11/16/2013 0847   BILITOT 0.4 08/04/2018 0426   BILITOT 0.3 06/10/2018 1001   BILITOT 0.35 11/16/2013 0847  GFRNONAA 18 (L) 08/14/2018 0347   GFRNONAA 48 (L) 06/10/2018 1001   GFRAA 21 (L) 08/14/2018 0347   GFRAA 56 (L) 06/10/2018 1001   VPA 21  Imaging I have reviewed images in epic and the results pertinent to this consultation are: MRIs of the brain done twice, over a week apart, unremarkable for acute change.  Assessment remains nearly unchanged from yesterday: Spoke with family in detail yesterday and answered all questions from a neurological perspective  78 year old man past medical history of myeloproliferative disorder, CHF, coronary disease, sleep apnea and recurrent UTIs initially admitted for treatment of complicated UTI, developed encephalopathy, EEG done and treated as cefepime toxicity versus subclinical status epilepticus with Versed and propofol drip, who has now been off of sedation for >10 days with no changes neurological examination. His renal function continues to be impaired and white cell count has been significantly higher than baseline. Initially, prolonged use of propofol/Versed and cefepime toxicity were being blamed for his poor muscular exam but I think that that is not the only cause for his encephalopathy and his deranged renal function probably has the most important role in his encephalopathy.  Impression: #Toxic metabolic encephalopathy-multifactorial-impaired renal function/uremia, prolonged sedation with Versed and propofol which was discontinued 10 days ago. 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  Management of renal failure per primary team -consider nephrology consultation  Management of myeloproliferative disorder per primary team-would be beneficial to touch base with hematology.  -- Amie Portland, MD [facility] Pager: [phone] If 7pm to 7am, please call on call as listed on AMION.

## 2018-08-14 NOTE — Consult Note (Signed)
[facility]    NEPHROLOGY CONSULTATION NOTE  PATIENT ID:  Juan [name], DOB:  30-Nov-1940  HPI: The patient is a 78 y.o. year old male patient with a past medical history significant for chronic urinary retention with BPH, prior Pseudomonas urinary tract infections, stage 3 chronic kidney disease, moderate to severe pulmonary hypertension, chronic diastolic heart failure, mild dysplasia with myelodysplastic syndrome, and chronic back pain who is being treated for a presumed urinary tract infection, worsening renal failure, and worsening mental status with each was originally thought to be neurotoxicity from cefepime.  Most recently, his BUN has increased to around 150, and neurology believes that he may have a component of uremic encephalopathy.  Renal consultation has been called for possible uremia.  He is up approximately 7kg since admission.  He has had around 800 mL of stool output so far today.  Past Medical History:  Diagnosis Date  . Allergic rhinitis   . Anemia   . Ascending aortic aneurysm ([place]) 03/2014   4.3cm on CT scan  . CAD (coronary artery disease)    dx elsewheer in past, no documentation. Non-ischemic myoview 2007  . Chronic diastolic CHF (congestive heart failure), NYHA class 2 (HCC)    Normal EF w/ grade 1 dd by echo 12/2015  . Chronic respiratory failure with hypoxia/2L at home    "2L; all the time" (05/03/2017)  . CKD (chronic kidney disease), stage III ([place])   . Edema    R>L leg, u/s 5-12 neg for DVT  . Hemorrhoid   . History of hydronephrosis   . History of Splenic infarct   . History of thrombocytosis   . Hypertension   . Iron deficiency anemia due to chronic blood loss 03/31/2017  . 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 Migraine    "once/wk at least" (07/11/2013)  . Moderate to severe pulmonary hypertension ([place])   . Myeloproliferative disease ([place])   . Pyelonephritis 04/29/2017  . Recurrent Pseudomonas urinary tract infection   . Shortness of  breath   . Sinus congestion   . Sleep apnea, obstructive    at some point used CPAP, was d/c  years ago  . Type II diabetes mellitus ([place])   . UTI (urinary tract infection) 06/2017  . UTI (urinary tract infection) 07/2017    Past Surgical History:  Procedure Laterality Date  . FLEXIBLE SIGMOIDOSCOPY N/A 06/08/2017   Procedure: FLEXIBLE SIGMOIDOSCOPY;  Surgeon: Carol Ada, MD;  Location: [place];  Service: Endoscopy;  Laterality: N/A;  . IR FLUORO GUIDE CV LINE RIGHT  05/07/2017  . IR FLUORO RM 30-60 MIN  05/07/2017  . IR US GUIDE VASC ACCESS RIGHT  05/07/2017  . TOE SURGERY Right    "tried to straighten out big toe" (07/11/2013)    Family History  Problem Relation Age of Onset  . Schizophrenia Son   . Mental illness Daughter   . Colon cancer Neg Hx   . Prostate cancer Neg Hx   . Heart attack Neg Hx   . Diabetes Neg Hx     Social History   Tobacco Use  . Smoking status: Former Smoker    Packs/day: 0.25    Years: 12.00    Pack years: 3.00    Types: Cigarettes    Last attempt to quit: 05/18/1966    Years since quitting: 52.2  . Smokeless tobacco: Never Used  . Tobacco comment: quit smoking 45 years ago  Substance Use Topics  . Alcohol use: No    Alcohol/week:  0.0 standard drinks  . Drug use: No    REVIEW OF SYSTEMS: Unable to obtain secondary to mental status.   PHYSICAL EXAM:  Vitals:   08/14/18 1437 08/14/18 1639  BP: (!) 99/45   Pulse: 77   Resp: 19   Temp:  98.5 F (36.9 C)  SpO2: 98%    I/O last 3 completed shifts: In: 4298.4 [I.V.:2258.4; Other:120; NG/GT:1920] Out: 326 [Urine:365; Stool:400]   General: Unresponsive NAD HEENT: MMM [place] AT anicteric sclera, intubated Neck:  No JVD, no adenopathy CV:  Heart irregular  lungs: Coarse breath sounds bilaterally Abd:  abd SNT/ND with normal BS GU:  Bladder non-palpable Extremities: +3 bilateral upper extremity edema. Skin:  No skin rash Psych: Unresponsive Neuro: Unresponsive   CURRENT  MEDICATIONS:  . sodium chloride   Intravenous Once  . chlorhexidine gluconate (MEDLINE KIT)  15 mL Mouth Rinse BID  . Chlorhexidine Gluconate Cloth  6 each Topical Daily  . clotrimazole  1 application Topical BID  . famotidine  20 mg Per Tube Daily  . feeding supplement (PRO-STAT SUGAR FREE 64)  30 mL Per Tube BID  . free water  400 mL Per Tube Q4H  . hydrocortisone cream  1 application Topical BID  . lacosamide  200 mg Per Tube BID  . levETIRAcetam  500 mg Per Tube BID  . mouth rinse  15 mL Mouth Rinse 10 times per day  . valproic acid  1,000 mg Per Tube QHS  . [START ON 08/15/2018] valproic acid  750 mg Per Tube q morning - 10a     HOME MEDICATIONS:  Prior to Admission medications   Medication Sig Start Date End Date Taking? Authorizing Provider  acetaminophen (TYLENOL) 325 MG tablet Take 2 tablets (650 mg total) by mouth every 6 (six) hours as needed for mild pain (or Fever >/= 101). 05/08/17  Yes Rai, Ripudeep K, MD  aspirin EC 81 MG tablet Take 1 tablet (81 mg total) by mouth daily. 06/27/18  Yes [name], MD  b complex vitamins tablet Take 1 tablet by mouth daily.   Yes [provider]  clotrimazole (LOTRIMIN AF) 1 % cream Apply to Lt foot (between) Toes bid for athletes foot Patient taking differently: Apply 1 application topically 2 (two) times daily. Apply to left foot (between) toes twice daily for athletes foot. 09/12/17  Yes Emokpae, Courage, MD  ferrous gluconate (FERGON) 240 (27 FE) MG tablet Take 240 mg by mouth daily.   Yes [provider]  furosemide (LASIX) 80 MG tablet Take 1 tablet (80 mg total) by mouth 2 (two) times daily. 06/27/18  Yes [name], MD  hydrALAZINE (APRESOLINE) 50 MG tablet Take 1.5 tablets (75 mg total) by mouth 3 (three) times daily. 02/09/18  Yes Bensimhon, Shaune Pascal, MD  hydrocortisone cream 1 % Apply 1 application topically 2 (two) times daily.   Yes [provider]  mouth rinse LIQD solution 15 mLs by Mouth  Rinse route 2 (two) times daily. 09/26/17  Yes Shelly Coss, MD  benzonatate (TESSALON) 100 MG capsule Take 1 capsule (100 mg total) by mouth 3 (three) times daily as needed for cough. 03/17/18   Colon Branch, MD  miconazole (MICOTIN) 2 % powder Apply 1 application topically as needed for itching.     [provider]  ondansetron (ZOFRAN) 4 MG tablet Take 1 tablet (4 mg total) by mouth every 6 (six) hours as needed for nausea or vomiting. Patient not taking: Reported on  08/15/2018 09/12/17   Roxan Hockey, MD  OXYGEN Inhale 2 L into the lungs continuous.    [provider]  tamsulosin (FLOMAX) 0.4 MG CAPS capsule Take 1 capsule (0.4 mg total) by mouth daily after supper. Patient taking differently: Take 0.4 mg by mouth daily as needed (urination).  06/13/17   Bonnielee Haff, MD       LABS:  CBC Latest Ref Rng & Units 08/14/2018 08/13/2018 08/12/2018  WBC 4.0 - 10.5 K/uL 49.1(H) 44.4(H) -  Hemoglobin 13.0 - 17.0 g/dL 7.5(L) 7.7(L) 7.6(L)  Hematocrit 39.0 - 52.0 % 25.4(L) 25.9(L) 25.0(L)  Platelets 150 - 400 K/uL 198 201 -    CMP Latest Ref Rng & Units 08/14/2018 08/13/2018 08/12/2018  Glucose 70 - 99 mg/dL 114(H) 108(H) 127(H)  BUN 8 - 23 mg/dL 159(H) 135(H) 126(H)  Creatinine 0.61 - 1.24 mg/dL 3.21(H) 2.47(H) 2.07(H)  Sodium 135 - 145 mmol/L 139 143 147(H)  Potassium 3.5 - 5.1 mmol/L 4.5 4.0 4.0  Chloride 98 - 111 mmol/L 112(H) 117(H) 119(H)  CO2 22 - 32 mmol/L 12(L) 13(L) 15(L)  Calcium 8.9 - 10.3 mg/dL 8.6(L) 8.3(L) 9.5  Total Protein 6.5 - 8.1 g/dL - - -  Total Bilirubin 0.3 - 1.2 mg/dL - - -  Alkaline Phos 38 - 126 U/L - - -  AST 15 - 41 U/L - - -  ALT 0 - 44 U/L - - -    Lab Results  Component Value Date   PTH 121 (H) 04/13/2016   CALCIUM 8.6 (L) 08/14/2018   CAION 1.37 08/01/2018   PHOS 5.4 (H) 08/12/2018       Component Value Date/Time   COLORURINE AMBER (A) 08/10/2018 0500   APPEARANCEUR TURBID (A) 08/10/2018 0500   LABSPEC 1.014 08/10/2018 0500    PHURINE 5.0 08/10/2018 0500   GLUCOSEU NEGATIVE 08/10/2018 0500   GLUCOSEU NEGATIVE 05/02/2015 1008   HGBUR SMALL (A) 08/10/2018 0500   BILIRUBINUR NEGATIVE 08/10/2018 0500   KETONESUR NEGATIVE 08/10/2018 0500   PROTEINUR 100 (A) 08/10/2018 0500   UROBILINOGEN 0.2 05/02/2015 1008   NITRITE NEGATIVE 08/10/2018 0500   LEUKOCYTESUR LARGE (A) 08/10/2018 0500      Component Value Date/Time   PHART 7.314 (L) 08/01/2018 0328   PCO2ART 34.7 08/01/2018 0328   PO2ART 165.0 (H) 08/01/2018 0328   HCO3 17.6 (L) 08/01/2018 0328   TCO2 19 (L) 08/01/2018 0328   ACIDBASEDEF 8.0 (H) 08/01/2018 0328   O2SAT 99.0 08/01/2018 0328       Component Value Date/Time   IRON 37 (L) 03/02/2018 1214   IRON 14 (L) 03/30/2017 1420   TIBC 225 03/02/2018 1214   TIBC 179 (L) 03/30/2017 1420   FERRITIN 441 (H) 03/02/2018 1214   FERRITIN 434 (H) 03/30/2017 1420   IRONPCTSAT 16 (L) 03/02/2018 1214   IRONPCTSAT 8 (L) 03/30/2017 1420   IRONPCTSAT 23 01/12/2013 1100       ASSESSMENT/PLAN:     Problem List Items Addressed This Visit      Respiratory   Acute on chronic respiratory failure with hypoxia (HCC)   Relevant Orders   DG Chest Port 1 View (Completed)     Genitourinary   Hydronephrosis   Relevant Orders   US RENAL (Completed)   * (Principal) Complicated UTI (urinary tract infection) - Primary   Relevant Medications   cefTRIAXone (ROCEPHIN) 1 g in sodium chloride 0.9 % 100 mL IVPB (Completed)   clotrimazole (LOTRIMIN) 1 % cream 1 application   phenazopyridine (PYRIDIUM) tablet  100 mg (Completed)   cefTRIAXone (ROCEPHIN) 2 g in sodium chloride 0.9 % 100 mL IVPB (Completed)    Other Visit Diagnoses    Bilateral hydronephrosis       Fever       Hypoxia       Relevant Orders   DG Chest 1 View (Completed)   Fever, unspecified fever cause       Endotracheal tube present       Relevant Orders   Portable Chest x-ray (Completed)   Respiratory failure requiring intubation ([place])       History of  ETT       Relevant Orders   DG Chest Port 1 View (Completed)   Respiratory failure [facility])       Relevant Orders   DG Chest Port 1 View (Completed)   DG CHEST PORT 1 VIEW (Completed)   DG Chest Port 1 View (Completed)   DG Chest Port 1 View (Completed)   DG Chest Port 1 View   DG Chest Port 1 View (Completed)   Respiratory crackles          1.  Baseline serum creatinine 1.4 mg/dL.  2.  Acute kidney injury.  I suspect this is multifactorial, related to hypoperfusion, chronic obstruction, increased stool output, and probable ATN.  BUN is markedly elevated consistent with a prerenal process.  Examines fluid overloaded.  Would be helpful, if we are to be aggressive, to get some more hemodynamic monitoring.  Given the large amount of stool output, will add IV fluids today.  Discussed with daughter, [name].  Advised her that the patient's overall prognosis is poor given multiple comorbidities and dialysis will not likely improve the overall picture for the patient, and that the patient may not tolerate dialysis.  She wished to pursue IV fluids first.  There is not consensus among the siblings of whether or not they would want to dialysis.  I advised her that we would try IV fluids tonight, and if no improvement, we would need a consensus on dialysis tomorrow.   I have asked her to discuss potential dialysis with her siblings.  3.  Possible uremia.  The BUN has been increasing, consistent with a catabolic state and decreased renal perfusion.  Will avoid high-protein supplements.  Discussed dialysis today, and will readdress in the morning.  4.  Mixed gap and non-anion gap metabolic acidosis.  The significant non-anion gap metabolic acidosis is likely secondary to the stool output.  We will add bicarbonate to IV fluids.  5.  Complicated urinary tract infection.  Antibiotics per primary team.    Gaylene Brooks, DO, FACP

## 2018-08-15 ENCOUNTER — Inpatient Hospital Stay (HOSPITAL_COMMUNITY): Payer: Medicare Other

## 2018-08-15 DIAGNOSIS — R40243 Glasgow coma scale score 3-8, unspecified time: Secondary | ICD-10-CM

## 2018-08-15 LAB — RENAL FUNCTION PANEL
ANION GAP: 17 — AB (ref 5–15)
Albumin: 1.2 g/dL — ABNORMAL LOW (ref 3.5–5.0)
BUN: 174 mg/dL — ABNORMAL HIGH (ref 8–23)
CO2: 14 mmol/L — ABNORMAL LOW (ref 22–32)
Calcium: 8.6 mg/dL — ABNORMAL LOW (ref 8.9–10.3)
Chloride: 101 mmol/L (ref 98–111)
Creatinine, Ser: 3.57 mg/dL — ABNORMAL HIGH (ref 0.61–1.24)
GFR calc Af Amer: 18 mL/min — ABNORMAL LOW (ref >60)
GFR, EST NON AFRICAN AMERICAN: 16 mL/min — AB (ref >60)
Glucose, Bld: 140 mg/dL — ABNORMAL HIGH (ref 70–99)
Phosphorus: 5.9 mg/dL — ABNORMAL HIGH (ref 2.5–4.6)
Potassium: 4.5 mmol/L (ref 3.5–5.1)
Sodium: 132 mmol/L — ABNORMAL LOW (ref 135–145)

## 2018-08-15 LAB — CULTURE, BLOOD (ROUTINE X 2)
Culture: NO GROWTH
Culture: NO GROWTH
Special Requests: ADEQUATE
Special Requests: ADEQUATE

## 2018-08-15 LAB — CBC
HCT: 22.6 % — ABNORMAL LOW (ref 39.0–52.0)
Hemoglobin: 7 g/dL — ABNORMAL LOW (ref 13.0–17.0)
MCH: 25.9 pg — ABNORMAL LOW (ref 26.0–34.0)
MCHC: 31 g/dL (ref 30.0–36.0)
MCV: 83.7 fL (ref 80.0–100.0)
Platelets: 149 10*3/uL — ABNORMAL LOW (ref 150–400)
RBC: 2.7 MIL/uL — AB (ref 4.22–5.81)
RDW: 19.9 % — ABNORMAL HIGH (ref 11.5–15.5)
WBC: 41 10*3/uL — ABNORMAL HIGH (ref 4.0–10.5)
nRBC: 0.8 % — ABNORMAL HIGH (ref 0.0–0.2)

## 2018-08-15 LAB — BASIC METABOLIC PANEL
ANION GAP: 14 (ref 5–15)
BUN: 164 mg/dL — ABNORMAL HIGH (ref 8–23)
CO2: 15 mmol/L — ABNORMAL LOW (ref 22–32)
Calcium: 8 mg/dL — ABNORMAL LOW (ref 8.9–10.3)
Chloride: 104 mmol/L (ref 98–111)
Creatinine, Ser: 3.37 mg/dL — ABNORMAL HIGH (ref 0.61–1.24)
GFR calc Af Amer: 19 mL/min — ABNORMAL LOW (ref >60)
GFR, EST NON AFRICAN AMERICAN: 17 mL/min — AB (ref >60)
GLUCOSE: 249 mg/dL — AB (ref 70–99)
Potassium: 4.6 mmol/L (ref 3.5–5.1)
Sodium: 133 mmol/L — ABNORMAL LOW (ref 135–145)

## 2018-08-15 LAB — GLUCOSE, CAPILLARY
Glucose-Capillary: 108 mg/dL — ABNORMAL HIGH (ref 70–99)
Glucose-Capillary: 119 mg/dL — ABNORMAL HIGH (ref 70–99)
Glucose-Capillary: 121 mg/dL — ABNORMAL HIGH (ref 70–99)
Glucose-Capillary: 122 mg/dL — ABNORMAL HIGH (ref 70–99)

## 2018-08-15 LAB — VALPROIC ACID LEVEL: Valproic Acid Lvl: 22 ug/mL — ABNORMAL LOW (ref 50.0–100.0)

## 2018-08-15 MED ORDER — FREE WATER
200.0000 mL | Status: DC
  Administered 2018-08-15 – 2018-08-16 (×6): 200 mL

## 2018-08-15 MED ORDER — PRISMASOL BGK 4/2.5 32-4-2.5 MEQ/L REPLACEMENT SOLN
Status: DC
  Administered 2018-08-15 – 2018-08-18 (×7): via INTRAVENOUS_CENTRAL
  Filled 2018-08-15 (×18): qty 5000

## 2018-08-15 MED ORDER — PRO-STAT SUGAR FREE PO LIQD
60.0000 mL | Freq: Two times a day (BID) | ORAL | Status: DC
  Administered 2018-08-15 – 2018-08-18 (×6): 60 mL
  Filled 2018-08-15 (×7): qty 60

## 2018-08-15 MED ORDER — PRISMASOL BGK 4/2.5 32-4-2.5 MEQ/L IV SOLN
INTRAVENOUS | Status: DC
  Administered 2018-08-15 – 2018-08-18 (×18): via INTRAVENOUS_CENTRAL
  Filled 2018-08-15 (×55): qty 5000

## 2018-08-15 MED ORDER — PRISMASOL BGK 4/2.5 32-4-2.5 MEQ/L REPLACEMENT SOLN
Status: DC
  Administered 2018-08-15 – 2018-08-18 (×7): via INTRAVENOUS_CENTRAL
  Filled 2018-08-15 (×18): qty 5000

## 2018-08-15 MED ORDER — HEPARIN SODIUM (PORCINE) 1000 UNIT/ML DIALYSIS
1000.0000 [IU] | INTRAMUSCULAR | Status: DC | PRN
  Administered 2018-08-15: 2400 [IU] via INTRAVENOUS_CENTRAL
  Administered 2018-08-18: 3000 [IU] via INTRAVENOUS_CENTRAL
  Filled 2018-08-15 (×4): qty 6

## 2018-08-15 MED ORDER — PIPERACILLIN-TAZOBACTAM 3.375 G IVPB 30 MIN
3.3750 g | Freq: Four times a day (QID) | INTRAVENOUS | Status: DC
  Administered 2018-08-15 – 2018-08-16 (×3): 3.375 g via INTRAVENOUS
  Filled 2018-08-15 (×7): qty 50

## 2018-08-15 MED ORDER — VITAL 1.5 CAL PO LIQD
1000.0000 mL | ORAL | Status: DC
  Administered 2018-08-15 – 2018-08-18 (×4): 1000 mL
  Filled 2018-08-15 (×5): qty 1000

## 2018-08-15 NOTE — Progress Notes (Signed)
[facility]    NEPHROLOGY PROGRESS NOTE  SUBJECTIVE: Unresponsive, unable to provide review of systems.  On pressors.  Minimal urine output.   OBJECTIVE:  Vitals:   08/15/18 1215 08/15/18 1310  BP: (!) 102/52 (!) 105/56  Pulse: 70 66  Resp: 20 20  Temp:    SpO2: 97% 95%    Intake/Output Summary (Last 24 hours) at 08/15/2018 1412 Last data filed at 08/15/2018 1200 Gross per 24 hour  Intake 4755 ml  Output 420 ml  Net 4335 ml      General: Unresponsive, no acute distress HEENT: MMM [place] AT anicteric sclera Neck:  No JVD, no adenopathy CV:  Heart RRR  Lungs: Coarse breath sounds bilaterally Abd: Abdomen distended, nontender GU:  Bladder non-palpable Extremities: 3+ bilateral upper and lower extremity edema Skin:  No skin rash  MEDICATIONS:  . sodium chloride   Intravenous Once  . chlorhexidine gluconate (MEDLINE KIT)  15 mL Mouth Rinse BID  . Chlorhexidine Gluconate Cloth  6 each Topical Daily  . clotrimazole  1 application Topical BID  . famotidine  20 mg Per Tube Daily  . feeding supplement (PRO-STAT SUGAR FREE 64)  30 mL Per Tube BID  . free water  200 mL Per Tube Q4H  . hydrocortisone cream  1 application Topical BID  . lacosamide  200 mg Per Tube BID  . levETIRAcetam  500 mg Per Tube BID  . mouth rinse  15 mL Mouth Rinse 10 times per day  . valproic acid  1,000 mg Per Tube QHS  . valproic acid  750 mg Per Tube q morning - 10a       LABS:   CBC Latest Ref Rng & Units 08/15/2018 08/14/2018 08/13/2018  WBC 4.0 - 10.5 K/uL 41.0(H) 49.1(H) 44.4(H)  Hemoglobin 13.0 - 17.0 g/dL 7.0(L) 7.5(L) 7.7(L)  Hematocrit 39.0 - 52.0 % 22.6(L) 25.4(L) 25.9(L)  Platelets 150 - 400 K/uL 149(L) 198 201    CMP Latest Ref Rng & Units 08/15/2018 08/14/2018 08/13/2018  Glucose 70 - 99 mg/dL 249(H) 114(H) 108(H)  BUN 8 - 23 mg/dL 164(H) 159(H) 135(H)  Creatinine 0.61 - 1.24 mg/dL 3.37(H) 3.21(H) 2.47(H)  Sodium 135 - 145 mmol/L 133(L) 139 143  Potassium 3.5 - 5.1  mmol/L 4.6 4.5 4.0  Chloride 98 - 111 mmol/L 104 112(H) 117(H)  CO2 22 - 32 mmol/L 15(L) 12(L) 13(L)  Calcium 8.9 - 10.3 mg/dL 8.0(L) 8.6(L) 8.3(L)  Total Protein 6.5 - 8.1 g/dL - - -  Total Bilirubin 0.3 - 1.2 mg/dL - - -  Alkaline Phos 38 - 126 U/L - - -  AST 15 - 41 U/L - - -  ALT 0 - 44 U/L - - -    Lab Results  Component Value Date   PTH 121 (H) 04/13/2016   CALCIUM 8.0 (L) 08/15/2018   CAION 1.37 08/01/2018   PHOS 5.4 (H) 08/12/2018       Component Value Date/Time   COLORURINE AMBER (A) 08/10/2018 0500   APPEARANCEUR TURBID (A) 08/10/2018 0500   LABSPEC 1.014 08/10/2018 0500   PHURINE 5.0 08/10/2018 0500   GLUCOSEU NEGATIVE 08/10/2018 0500   GLUCOSEU NEGATIVE 05/02/2015 1008   HGBUR SMALL (A) 08/10/2018 0500   BILIRUBINUR NEGATIVE 08/10/2018 0500   KETONESUR NEGATIVE 08/10/2018 0500   PROTEINUR 100 (A) 08/10/2018 0500   UROBILINOGEN 0.2 05/02/2015 1008   NITRITE NEGATIVE 08/10/2018 0500   LEUKOCYTESUR LARGE (A) 08/10/2018 0500      Component Value Date/Time  PHART 7.314 (L) 08/01/2018 0328   PCO2ART 34.7 08/01/2018 0328   PO2ART 165.0 (H) 08/01/2018 0328   HCO3 17.6 (L) 08/01/2018 0328   TCO2 19 (L) 08/01/2018 0328   ACIDBASEDEF 8.0 (H) 08/01/2018 0328   O2SAT 99.0 08/01/2018 0328       Component Value Date/Time   IRON 37 (L) 03/02/2018 1214   IRON 14 (L) 03/30/2017 1420   TIBC 225 03/02/2018 1214   TIBC 179 (L) 03/30/2017 1420   FERRITIN 441 (H) 03/02/2018 1214   FERRITIN 434 (H) 03/30/2017 1420   IRONPCTSAT 16 (L) 03/02/2018 1214   IRONPCTSAT 8 (L) 03/30/2017 1420   IRONPCTSAT 23 01/12/2013 1100       ASSESSMENT/PLAN:      1.  Baseline serum creatinine 1.4 mg/dL.  2.  Acute kidney injury.  I suspect this is multifactorial, related to hypoperfusion, chronic obstruction, increased stool output, and probable ATN.  BUN is markedly elevated consistent with a prerenal process.  Examines fluid overloaded.    Discussed with daughters [name] and  [name].  Advised then that the patient's overall prognosis is poor given multiple comorbidities and dialysis will not likely improve the overall picture for the patient, and that the patient may not tolerate dialysis.    He has had no response to IV fluids.  The family would like to pursue dialysis for short-term, likely 3 days, and then will reassess.  Advised them that the patient is not a good candidate for long-term dialysis given comorbidities.  They would like to see if dialysis will improve his mental status.  We will plan for CRRT today.  3.  Possible uremia.  The BUN has been increasing, consistent with a catabolic state and decreased renal perfusion.  Will avoid high-protein supplements.    Will begin dialysis for uremia.  We will plan for CRRT, but at a reduced rate so we do not correct the BUN too quickly.  4.  Mixed gap and non-anion gap metabolic acidosis.    Improving.  5.  Complicated urinary tract infection.  Antibiotics per primary team.       Gaylene Brooks, DO, FACP

## 2018-08-15 NOTE — Progress Notes (Signed)
NAME:  Juan [name], MRN:  [id number], DOB:  [birth year], LOS: 41 ADMISSION DATE:  08/10/2018, CONSULTATION DATE:  3/13 REFERRING MD:  Lupita Leash, CHIEF COMPLAINT:  Acute encephalopathy and worsening mental status    Brief History   78 year old male patient admitted initially on 3/7 with sepsis secondary secondary to pseudomonas urinary tract infection with associated acute on chronic renal failure secondary to obstructive uropathy.  Over the course of his hospitalization developed progressive encephalopathy, with concern for nonconvulsive status on 3/12.  This was felt possibly secondary to neurotoxicity from cefepime.  Intubated for airway protection. He was initially under propofol, and transition to Versed due to severe bradycardia on 3/16, Versed was stopped 3/17.  Past Medical History  Chronic urinary retention with prostate hypertrophy, prior Pseudomonas urinary tract infections, stage III chronic kidney disease, moderate to severe pulmonary hypertension, chronic diastolic heart failure, myelodysplasia with myelodysplastic syndrome, chronic back pain  Significant Hospital Events   3/7: Admitted with presumptive diagnosis of urinary tract infection 3/9: worsening renal failure, seen by urology, Foley catheter placed 3/12: Worsening mental status, EEG worrisome for possible nonconvulsive status.  Felt possibly due to neurotoxicity from cefepime this was discontinued and Zosyn was started.  lumbar puncture performed, for possible meningitis.  Infectious disease consulted, neurology consulted, started on Keppra, moved to [facility] for continuous EEG monitoring 3/13 remains unresponsive, developed worsening hypoxia staph from prior LP unremarkable without evidence of meningitis.  Pulmonary asked to evaluate 3/17 Episode of bradycardia overnight in spite of stopping propofol hence dopamine started. Episode of mucus plugging 3/17 versed stopped 3/24>> No improvement in neuro status despite off sedation s 3/26 >>  overnight with low hgb s/p transfusion, Afib w/RVR, hypotensive requiring pressors, oliguria 3/28 -A Fib RVR -Amio bolus x 1 , weaning pressors   Consults:  ID Neurology Urology PCCM  Procedures:  3/12 Fluoro-guided LP performed at L3-L4. Opening pressure 12 cm H20. 13.5 ml clear CSF obtained 3/13 ETT >> 3/17 R DL PICC >>  Significant Diagnostic Tests:  CT abdomen pelvis 3/7 showed bilateral hydronephrosis with bladder wall thickening and multiple bladder diverticuli suggesting chronic bladder outlet obstruction with cystitis  Renal US 3/10  resolved bilateral hydronephrosis   MRI brain 3/12: Truncated and motion degraded examination without visible acute abnormality.  MRI L spine 3/12:1. Mild foraminal narrowing bilaterally at L4-5 is worse on the left.2. Chronic endplate changes at F6-8 with associated Schmorl's node. 3. Minimal rightward disc bulging without significant stenosis.4. 10 mm solid lesion at the upper pole of the right kidney was not well seen on recent CT scans. Small renal cell carcinoma is not excluded. Recommend dedicated MRI of the kidneys as the patient's condition allows. 5. Stable cyst at the lower pole of the right kidney.  EEG 3/12 frequent periodic discharges concerning for triphasic versus epileptiform GPD's 3/17 EEG >>burst suppression, triphasics GPD's US abdomen 3/12: 1. No acute findings. 2. Single visualized gallstone. No evidence of acute cholecystitis. No other abnormality.  MRI brain 3/21 neg  CT Head 3/24 No acute infarct, no hemorrhage no edema. Small lipoma between lateral ventricles, mild periventricular small vessel disease  CXR 3/25 Moderate pulmonary vascular congestion, low lung volumes.   CXR 3/27 stable ETT/ gastric; bilateral LL atelectasis, multiple lucent areas in the bony structures raise the concern for possible degree of underlying multiple myeloma  Micro Data:  UC 3/7 pseudomonas (cipro resistant) Urine cx 3/12 >>  pseudomonas BCx2 3/8>>> neg BCX2 3/12>>>neg CSF 3/12: neg resp culture 3/13>>>nml flora 3/16 urine >>  pseudomonas resp 3/18 >> providencia, multidrug-resistant (S ceftriaxone) 3/25 urine cultures negative 3/25 trach asp >> normal flora 3/25 BC x 2 >> ngtd >> 3/29 Trach asp>> few providencia stuartii  Antimicrobials:  Cefepime 3/7>>3/12 Zosyn 3/12>>> 3/19 zyvox 3/13>>>3/15 ceftx 3/21 (HCAP ) >> 3/27 Zosyn 3/29 >>  Interim history/subjective:  tmax 101.9 Remains on bicarb gtt , amio gtt, and neo at 60 mcg/min Remains unresponsive  sCr slightly worse, ongoing oliguria   Objective   Blood pressure (!) 110/48, pulse 80, temperature 98.5 F (36.9 C), temperature source Axillary, resp. rate (!) 22, height '6\' 3"'$  (1.905 m), weight 108.3 kg, SpO2 97 %.    Vent Mode: PSV;CPAP FiO2 (%):  [40 %] 40 % Set Rate:  [16 bmp] 16 bmp Vt Set:  [670 mL] 670 mL PEEP:  [5 cmH20] 5 cmH20 Pressure Support:  [12 cmH20] 12 cmH20 Plateau Pressure:  [16 cmH20-18 cmH20] 18 cmH20   Intake/Output Summary (Last 24 hours) at 08/15/2018 1107 Last data filed at 08/15/2018 1000 Gross per 24 hour  Intake 4718.96 ml  Output 410 ml  Net 4308.96 ml   Filed Weights   08/13/18 0500 08/14/18 0500 08/15/18 0449  Weight: 99 kg 99 kg 108.3 kg   Examination:  General:  Acute/ chronic ill appearing elderly male on MV in NAD HEENT: MM pink/moist, ETT/ OGT, pupils 3/reactive Neuro: unresponsive CV:  IRIR, no mumur PULM: on SBT 12/5 with TV in 300s, placed back on full support.  Diffuse rales and copious tan secretions  GI: hyperBS, 800 ml / 24 ml stool output/ flexiseal Extremities: warm/dry, anasarca +4  Skin: no rashes   Resolved Hospital Problem list   Transient bradycardia-due to propofol, resolved Hyperchloremia and Non-anion gap metabolic acidosis, resolved Hypertension  Assessment & Plan:   Acute Toxic /Metabolic Encephalopathy  Presumed nonconvulsive status +/- cefepime neurotoxicity-resolved on  EEG  S/p 8 hours of burst suppression on 3/16, all sedation off since 3/17, has since remained comatose EEG 3/27 abnormal EEG with general background slowing, no evidence of seizures, indicative of severe encephalopathy. Plan Appreciate neurology's input Continue current AED per Neurology- keppra/ depakote/ vimpat Ongoing neuro exams Prn ativan for seizure  Holding all sedation Appears to be a poor prognosis   Acute hypoxic respiratory failure Providencia HCAP Underlying severe PH and OSA - intermittent episodes of mucous plugging reported- resolved - CXR 3/30 improved left infiltrate  Plan Full MV support Daily SBT, mental status is a barrier to extubation Intubated since 3/13-> needs trach    Hypotension - likely related to rapid Afib/ RVR, slow progression of anemia and third spacing P:  Transfuse for Hgb <7 Wean Neo for MAP >65  Amio for afib  New Onset Afib- now SR post transfusion - possibly related to anemia and fever  P:  Tele monitoring Fever control Cont on Amio   Acute on chronic renal failure (st III CKD) 2/2 chronic urinary retention w/ h/o poorly functioning bladder, enlarged prostate and mild to mod obstruction. Has had multiple recurrent UTIs. Has not completed prior treatments in past d/t financial restraints.  Worsening uremia/ AKI- Solid lesion on upper pole of right kidney.  Hypokalemia-resolved  Hypernatremia-resolved   Lab Results  Component Value Date   CREATININE 3.37 (H) 08/15/2018   CREATININE 3.21 (H) 08/14/2018   CREATININE 2.47 (H) 08/13/2018   CREATININE 1.41 (H) 06/10/2018   CREATININE 1.48 (H) 04/08/2018   CREATININE 1.50 (H) 03/02/2018   CREATININE 1.3 (H) 03/30/2017   CREATININE 1.3 (H) 07/30/2014  CREATININE 1.2 11/16/2013    Recent Labs  Lab 08/13/18 0721 08/14/18 0347 08/15/18 0436  K 4.0 4.5 4.6   Recent Labs  Lab 08/13/18 0721 08/14/18 0347 08/15/18 0436  NA 143 139 133*  - net positive 24.6L , UOP  ~0.23m/kg/hr Plan Appreciate Nephrology input- may evolve to HD depending on family's wishes Bicarb gtt per renal  Monitor electrolytes and renal function Decrease free water to 200 ml q 4hr  Providencia HCAP with new infiltrate Complicated Pseudomonas Urinary tract infection in setting of obstructive uropathy-chronic Foley catheter, colonizer per ID, treated with 10 days of antibiotics total Ongoing severe leukocytosis- 60-50's WBC Ongoing fever Plan Continue zosyn  Following culture data from 3/25 Trend WBC/ fever curve   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   CT abdomen imaging also noting innumerable lytic lesions involving the spine Leukocytosis- baseline 22k range Plan Consider Hematology if we continue w/full aggressive care including dialysis  trending CBC  Iron def anemia Plan Transfuse for Hgb < 7 Observe for bleeding- currently no signs    Global:  Given no neurological improvement, off all sedation since 3/17, with worsening renal function, and evidence of progression of myelodysplastic syndrome / myelofibrosis, EEG with severe encephalopathy.  Ongoing family discussion for very poor prognosis - has 4 children.  I have consulted PMT to help coordinate GOCs.    Best practice:  Diet: NPO, TF Pain/Anxiety/Delirium protocol (if indicated): Off all sedation since 3/17 VAP protocol (if indicated): 3/13 DVT prophylaxis: SCDs, hold heparin  GI prophylaxis: H2B Glucose control: ssi Mobilitity BR Code Status: full code  Family Communication:  Pending Nephrology input will call family Disposition:  ICU    CCT 30 mins  BKennieth Rad MSN, AGACNP-BC [facility] Pgr: [phone] if no answer [phone]/30/2020, 11:55 AM  '

## 2018-08-15 NOTE — Progress Notes (Signed)
Pt in a-fib. HR upper 110's-120s. All other VSS. Is currently on amio gtt. NP made aware; no new orders at this time. Will continue to monitor.

## 2018-08-15 NOTE — Progress Notes (Addendum)
Nutrition Follow-up  DOCUMENTATION CODES:   Not applicable  INTERVENTION:   Change tube feeding: -Vital 1.5 @ 50 ml/hr via OGT (1200 ml) -60 ml Prostat BID -Free water 200 ml Q4 hours per CCM  Recommend addition of B complex with Vit C via OGT  Provides: 2200 kcals, 141 grams protein, 917 ml free water (2117 ml with flushes). Meets 106% of kcal needs and 100% of protein needs.   NUTRITION DIAGNOSIS:   Inadequate oral intake related to inability to eat as evidenced by NPO status.  Ongoing  GOAL:   Patient will meet greater than or equal to 90% of their needs  Met with TF  MONITOR:   Vent status, Labs, Skin, I & O's  REASON FOR ASSESSMENT:   Consult Enteral/tube feeding initiation and management  ASSESSMENT:   78 yo male with PMH of HTN, CAD, DM-2, myeloproliferative disease, CHF, iron deficiency, CKD who was admitted on 3/8 with complicated UTI. Transferred to the ICU and intubated on 3/13 for acute encephalopathy and worsening mental status.   Mental status remains barrier to extubation. Per nephrology family decided to pursue short term dialysis for likely three days. Plan for CRRT today. Spoke with RN regarding plan for TF. RD to change to more calorically dense formula as pt remains fluid overloaded. Protein needs increased due to initiation of CRRT. Recommend decreasing free water flushes while pt on IVF.   Weight noted to increase from 90.8 kg on 3/24 to 108.3 kg today. Will continue to monitor. Using EDW of 82.7 kg to estimated needs.   Patient is currently intubated on ventilator support MV: 12.3 L/min Temp (24hrs), Avg:100.2 F (37.9 C), Min:98.3 F (36.8 C), Max:101.9 F (38.8 C)  I/O: +23859 ml since 3/16 UOP: 205 ml x 24 hrs  Rectal tube: 800 ml x 24 hrs   Medications reviewed and include: NS @ 10 ml/hr, amiodarone, neosynephrine, sodium bicarb in D5 @ 100 ml/hr Labs reviewed: Na 133 (L) CBG 110-122  Diet Order:   Diet Order            Diet  NPO time specified  Diet effective now              EDUCATION NEEDS:   No education needs have been identified at this time  Skin:  Skin Assessment: Skin Integrity Issues: Skin Integrity Issues:: Stage II Stage I: N/A Stage II: R ear  Last BM:  3/30- 800 ml x 24 hrs rectal tube  Height:   Ht Readings from Last 1 Encounters:  07/29/18 '6\' 3"'$  (1.905 m)    Weight:   Wt Readings from Last 1 Encounters:  08/15/18 108.3 kg    Ideal Body Weight:  89.1 kg  BMI:  Body mass index is 29.84 kg/m.  Estimated Nutritional Needs:   Kcal:  2076 kcal  Protein:  130-145 grams  Fluid:  1000 ml + UOP   Mariana Single RD, LDN Clinical Nutrition Pager # [phone]

## 2018-08-15 NOTE — Progress Notes (Signed)
Pharmacy Antibiotic Note  Juan [name] is a 78 y.o. male admitted on 08/07/2018 with sepsis 2/2 Pseudomonas UTI, then received treatment with Zosyn for providencia HAP. Pt started on Zosyn yesterday with worsening congestion on CXR and fevers. CRRT to begin today per nephrology.  Plan: Adjust Zosyn to 3.375g IV over 30 min q6h  Height: 6\' 3"  (190.5 cm) Weight: 238 lb 12.1 oz (108.3 kg) IBW/kg (Calculated) : 84.5  Temp (24hrs), Avg:100.2 F (37.9 C), Min:98.3 F (36.8 C), Max:101.9 F (38.8 C)  Recent Labs  Lab 08/11/18 0333 08/12/18 0316 08/13/18 0533 08/13/18 0721 08/14/18 0347 08/15/18 0436  WBC 54.0* 44.8* 44.4*  --  49.1* 41.0*  CREATININE 2.04* 2.07*  --  2.47* 3.21* 3.37*    Estimated Creatinine Clearance: 24.8 mL/min (A) (by C-G formula based on SCr of 3.37 mg/dL (H)).    Allergies  Allergen Reactions  . Cefepime Other (See Comments)    Seizures   Antimicrobials this admission: CTX 3/7 x1 ED; 3/21 >> 3/27 Cefepime 3/8 >> 3/12 (D/C cefepime for seizures) Zosyn 3/13>>14; 3/16 >> 3/19; 3/30 >> Zyvox 3/13 >>3/16  Microbiology results: 3/8 BCx: NG final 3/7 UCx: >100k Pseudomonas aeruginosa 3/8 MRSA PCR: negative 3/12 CSF cx: NG final 3/12 BCx: NG final 3/12 MRSA PCR: negative 3/12 UCx: Pseudomonas aeruginosa 3/13 TA Cx: normal flora 3/16 UCx: Pseudomonas aeurginosa 3/18 TA Cx: Providencia stuartii 3/20 C. diff: negative 3/25 TA cx: normal flora 3/25 UCx: NGF 3/25 BCx: NG x4 3/29 TA Cx: providencia  Thank you for allowing pharmacy to be a part of this patient's care.   Arrie Senate, PharmD, BCPS Clinical Pharmacist [phone] Please check AMION for all [facility] numbers 08/15/2018

## 2018-08-15 NOTE — Progress Notes (Signed)
Subjective: No recent events.   Objective: Current vital signs: BP 119/66   Pulse (!) 109   Temp (!) 101.1 F (38.4 C) (Axillary) Comment: tylenol given  Resp (!) 23   Ht '6\' 3"'$  (1.905 m)   Wt 108.3 kg   SpO2 96%   BMI 29.84 kg/m  Vital signs in last 24 hours: Temp:  [98.5 F (36.9 C)-101.9 F (38.8 C)] 101.1 F (38.4 C) (03/30 0700) Pulse Rate:  [45-127] 109 (03/30 0854) Resp:  [19-25] 23 (03/30 0854) BP: (90-121)/(41-84) 119/66 (03/30 0854) SpO2:  [84 %-100 %] 96 % (03/30 0854) FiO2 (%):  [40 %] 40 % (03/30 0854) Weight:  [108.3 kg] 108.3 kg (03/30 0449)  Intake/Output from previous day: 03/29 0701 - 03/30 0700 In: 3922.4 [I.V.:2440.4; NG/GT:1320; IV Piggyback:162] Out: 1005 [Urine:205; Stool:800] Intake/Output this shift: Total I/O In: 249.4 [I.V.:146.8; NG/GT:90; IV Piggyback:12.5] Out: 230 [Urine:30; Stool:200] Nutritional status:  Diet Order            Diet NPO time specified  Diet effective now              Neurologic Exam: Ment: Eyes closed with no opening to any stimuli. No limb or other movement to any stimuli.  CN: Pupils 3 mm on left, 4 mm on right and unreactive, with hippus noted on the right. Weak doll's eye reflex on left, none on right in the context of scleral edema. Weak corneal reflexes bilaterally. Face flaccidly symmetric.  Motor/Sensory: Flaccid tone x 4 with no movement to any stimuli. Reflexes: Low amplitude brisk brachioradialis and patellar reflexes bilaterally. Toes mute.   Lab Results: Results for orders placed or performed during the hospital encounter of 08/13/2018 (from the past 48 hour(s))  Glucose, capillary     Status: Abnormal   Collection Time: 08/13/18 11:48 AM  Result Value Ref Range   Glucose-Capillary 110 (H) 70 - 99 mg/dL  Glucose, capillary     Status: None   Collection Time: 08/13/18  5:33 PM  Result Value Ref Range   Glucose-Capillary 98 70 - 99 mg/dL  Glucose, capillary     Status: Abnormal   Collection Time:  08/13/18  8:00 PM  Result Value Ref Range   Glucose-Capillary 108 (H) 70 - 99 mg/dL  Glucose, capillary     Status: Abnormal   Collection Time: 08/13/18 11:34 PM  Result Value Ref Range   Glucose-Capillary 106 (H) 70 - 99 mg/dL  Valproic acid level     Status: Abnormal   Collection Time: 08/14/18  3:47 AM  Result Value Ref Range   Valproic Acid Lvl 21 (L) 50.0 - 100.0 ug/mL    Comment: Performed at [facility], [address], [place], [place] [address]  CBC     Status: Abnormal   Collection Time: 08/14/18  3:47 AM  Result Value Ref Range   WBC 49.1 (H) 4.0 - 10.5 K/uL   RBC 3.04 (L) 4.22 - 5.81 MIL/uL   Hemoglobin 7.5 (L) 13.0 - 17.0 g/dL   HCT 25.4 (L) 39.0 - 52.0 %   MCV 83.6 80.0 - 100.0 fL   MCH 24.7 (L) 26.0 - 34.0 pg   MCHC 29.5 (L) 30.0 - 36.0 g/dL   RDW 19.7 (H) 11.5 - 15.5 %   Platelets 198 150 - 400 K/uL   nRBC 1.0 (H) 0.0 - 0.2 %    Comment: Performed at [facility] [address], [place], [place] [address]  Basic metabolic panel  Status: Abnormal   Collection Time: 08/14/18  3:47 AM  Result Value Ref Range   Sodium 139 135 - 145 mmol/L   Potassium 4.5 3.5 - 5.1 mmol/L   Chloride 112 (H) 98 - 111 mmol/L   CO2 12 (L) 22 - 32 mmol/L   Glucose, Bld 114 (H) 70 - 99 mg/dL   BUN 159 (H) 8 - 23 mg/dL   Creatinine, Ser 3.21 (H) 0.61 - 1.24 mg/dL   Calcium 8.6 (L) 8.9 - 10.3 mg/dL   GFR calc non Af Amer 18 (L) >60 mL/min   GFR calc Af Amer 21 (L) >60 mL/min   Anion gap 15 5 - 15    Comment: Performed at [facility] [address], [place], [place] [address]  Glucose, capillary     Status: Abnormal   Collection Time: 08/14/18  3:52 AM  Result Value Ref Range   Glucose-Capillary 105 (H) 70 - 99 mg/dL  Glucose, capillary     Status: Abnormal   Collection Time: 08/14/18  7:38 AM  Result Value Ref Range   Glucose-Capillary 112 (H) 70 - 99 mg/dL  Glucose, capillary     Status: Abnormal   Collection Time: 08/14/18 11:35 AM  Result Value Ref  Range   Glucose-Capillary 116 (H) 70 - 99 mg/dL  Culture, respiratory (non-expectorated)     Status: None (Preliminary result)   Collection Time: 08/14/18  1:21 PM  Result Value Ref Range   Specimen Description TRACHEAL ASPIRATE    Special Requests NONE    Gram Stain      NO SQUAMOUS EPITHELIAL CELLS PRESENT FEW WBC PRESENT,BOTH PMN AND MONONUCLEAR FEW GRAM NEGATIVE RODS RARE GRAM POSITIVE COCCI IN CLUSTERS Performed at [facility], [place] [address] [place], [place] [address]    Culture FEW GRAM NEGATIVE RODS    Report Status PENDING   Glucose, capillary     Status: Abnormal   Collection Time: 08/14/18  4:41 PM  Result Value Ref Range   Glucose-Capillary 111 (H) 70 - 99 mg/dL  Glucose, capillary     Status: Abnormal   Collection Time: 08/14/18  8:19 PM  Result Value Ref Range   Glucose-Capillary 110 (H) 70 - 99 mg/dL  Glucose, capillary     Status: Abnormal   Collection Time: 08/15/18 12:00 AM  Result Value Ref Range   Glucose-Capillary 122 (H) 70 - 99 mg/dL  Glucose, capillary     Status: Abnormal   Collection Time: 08/15/18  3:37 AM  Result Value Ref Range   Glucose-Capillary 108 (H) 70 - 99 mg/dL  Valproic acid level     Status: Abnormal   Collection Time: 08/15/18  4:36 AM  Result Value Ref Range   Valproic Acid Lvl 22 (L) 50.0 - 100.0 ug/mL    Comment: Performed at [facility], [place] [address] [place], [place] [address]  CBC     Status: Abnormal   Collection Time: 08/15/18  4:36 AM  Result Value Ref Range   WBC 41.0 (H) 4.0 - 10.5 K/uL   RBC 2.70 (L) 4.22 - 5.81 MIL/uL   Hemoglobin 7.0 (L) 13.0 - 17.0 g/dL   HCT 22.6 (L) 39.0 - 52.0 %   MCV 83.7 80.0 - 100.0 fL   MCH 25.9 (L) 26.0 - 34.0 pg   MCHC 31.0 30.0 - 36.0 g/dL   RDW 19.9 (H) 11.5 - 15.5 %   Platelets 149 (L) 150 - 400 K/uL   nRBC 0.8 (  H) 0.0 - 0.2 %    Comment: Performed at [facility], [place] [address] [place], [place] [address]  Basic metabolic panel     Status: Abnormal    Collection Time: 08/15/18  4:36 AM  Result Value Ref Range   Sodium 133 (L) 135 - 145 mmol/L   Potassium 4.6 3.5 - 5.1 mmol/L   Chloride 104 98 - 111 mmol/L   CO2 15 (L) 22 - 32 mmol/L   Glucose, Bld 249 (H) 70 - 99 mg/dL   BUN 164 (H) 8 - 23 mg/dL   Creatinine, Ser 3.37 (H) 0.61 - 1.24 mg/dL   Calcium 8.0 (L) 8.9 - 10.3 mg/dL   GFR calc non Af Amer 17 (L) >60 mL/min   GFR calc Af Amer 19 (L) >60 mL/min   Anion gap 14 5 - 15    Comment: Performed at [facility] [address], [place], [place] [address]    Recent Results (from the past 240 hour(s))  C difficile quick scan w PCR reflex     Status: None   Collection Time: 08/05/18 12:18 PM  Result Value Ref Range Status   C Diff antigen NEGATIVE NEGATIVE Final   C Diff toxin NEGATIVE NEGATIVE Final   C Diff interpretation No C. difficile detected.  Final    Comment: Performed at [facility], [place] [address], [place], [place] [address]  Culture, respiratory     Status: None   Collection Time: 08/10/18  3:57 AM  Result Value Ref Range Status   Specimen Description ENDOTRACHEAL  Final   Special Requests NONE  Final   Gram Stain   Final    ABUNDANT WBC PRESENT, PREDOMINANTLY PMN NO ORGANISMS SEEN    Culture   Final    RARE Consistent with normal respiratory flora. Performed at [facility], [place] [address] [place], [place] [address]    Report Status 08/12/2018 FINAL  Final  Culture, Urine     Status: None   Collection Time: 08/10/18  5:00 AM  Result Value Ref Range Status   Specimen Description URINE, CATHETERIZED  Final   Special Requests NONE  Final   Culture   Final    NO GROWTH Performed at [facility], [place] [address] [place], [place] [address]    Report Status 08/11/2018 FINAL  Final  Culture, blood (routine x 2)     Status: None (Preliminary result)   Collection Time: 08/10/18  5:16 AM  Result Value Ref Range Status   Specimen Description BLOOD LEFT FOREARM  Final   Special Requests    Final    BOTTLES DRAWN AEROBIC AND ANAEROBIC Blood Culture adequate volume   Culture   Final    NO GROWTH 4 DAYS Performed at [facility], [place] [address], [place], [place] [address]    Report Status PENDING  Incomplete  Culture, blood (routine x 2)     Status: None (Preliminary result)   Collection Time: 08/10/18  5:26 AM  Result Value Ref Range Status   Specimen Description BLOOD LEFT HAND  Final   Special Requests   Final    BOTTLES DRAWN AEROBIC AND ANAEROBIC Blood Culture adequate volume   Culture   Final    NO GROWTH 4 DAYS Performed at [facility], [place] [address], [place], [place] [address]    Report Status PENDING  Incomplete  Culture, respiratory (non-expectorated)     Status: None (  Preliminary result)   Collection Time: 08/14/18  1:21 PM  Result Value Ref Range Status   Specimen Description TRACHEAL ASPIRATE  Final   Special Requests NONE  Final   Gram Stain   Final    NO SQUAMOUS EPITHELIAL CELLS PRESENT FEW WBC PRESENT,BOTH PMN AND MONONUCLEAR FEW GRAM NEGATIVE RODS RARE GRAM POSITIVE COCCI IN CLUSTERS Performed at [facility], [place] [address] [place], [place] [address]    Culture FEW GRAM NEGATIVE RODS  Final   Report Status PENDING  Incomplete    Lipid Panel No results for input(s): CHOL, TRIG, HDL, CHOLHDL, VLDL, LDLCALC in the last 72 hours.  Studies/Results: Dg Chest Port 1 View  Result Date: 08/15/2018 CLINICAL DATA:  Respiratory failure.  Intubated patient. EXAM: PORTABLE CHEST 1 VIEW COMPARISON:  Single-view of the chest 08/14/2018 and 08/12/2018. FINDINGS: NG tube, right PICC and ET tube are unchanged. Hazy airspace disease in the left mid lung zone is improved since the most recent study. Heart size is enlarged. Atherosclerosis noted. No pneumothorax or pleural effusion. IMPRESSION: Improved airspace disease in left mid lung zone. No new abnormality. Electronically Signed   By: Inge Rise M.D.   On: 08/15/2018 07:58   Dg Chest  Port 1 View  Result Date: 08/14/2018 CLINICAL DATA:  Respiratory failure. EXAM: PORTABLE CHEST 1 VIEW COMPARISON:  Radiograph August 12, 2018. FINDINGS: Stable cardiomegaly. Endotracheal and nasogastric tubes are unchanged in position. Right-sided PICC line is unchanged in position. No pneumothorax is noted. Minimal right basilar subsegmental atelectasis is noted. Increased airspace opacity is seen in lingular segment of left upper lobe concerning for worsening pneumonia or atelectasis. No significant pleural effusion is noted. Increased sclerosis of visualized skeleton is noted consistent with renal osteodystrophy. IMPRESSION: Increased left lingular opacity is noted suggesting worsening pneumonia or atelectasis. Stable support apparatus. Electronically Signed   By: Marijo Conception, M.D.   On: 08/14/2018 07:16    Medications:  Scheduled: . sodium chloride   Intravenous Once  . chlorhexidine gluconate (MEDLINE KIT)  15 mL Mouth Rinse BID  . Chlorhexidine Gluconate Cloth  6 each Topical Daily  . clotrimazole  1 application Topical BID  . famotidine  20 mg Per Tube Daily  . feeding supplement (PRO-STAT SUGAR FREE 64)  30 mL Per Tube BID  . free water  400 mL Per Tube Q4H  . hydrocortisone cream  1 application Topical BID  . lacosamide  200 mg Per Tube BID  . levETIRAcetam  500 mg Per Tube BID  . mouth rinse  15 mL Mouth Rinse 10 times per day  . valproic acid  1,000 mg Per Tube QHS  . valproic acid  750 mg Per Tube q morning - 10a   Continuous: . sodium chloride Stopped (08/15/18 0606)  . amiodarone 30 mg/hr (08/15/18 0803)  . feeding supplement (VITAL AF 1.2 CAL) 1,000 mL (08/10/18 2351)  . phenylephrine (NEO-SYNEPHRINE) Adult infusion 80 mcg/min (08/15/18 0800)  . piperacillin-tazobactam (ZOSYN)  IV 12.5 mL/hr at 08/15/18 0800  .  sodium bicarbonate  infusion 1000 mL 100 mL/hr at 08/15/18 0800    Assessment:  78 year old male with a PMHx of myeloproliferative disorder, CHF, coronary  disease, sleep apnea and recurrent UTIs initially admitted for treatment of complicated UTI, developed encephalopathy. Initial presentation was most consistent with cefepime toxicity precipitating subclinical status epilepticus, which was treated with Versed and propofol drip. Spinal tap was negative for infection and also was not consistent with an inflammatory etiology.  Initial EEG on 3/12 revealed an indeterminate pattern which could be seen with nonconvulsive status epilepticus or with metabolic/toxic encephalopathy.Subsequent EEG on 3/14 revealed rapid GPDs and diffuse background slowing with some myoclonus occurring in association with the GPDs, concerning for a nonconvulsive seizure pattern - the GPDs resolved with increased sedation, consistent with nonconvulsive seizure. Events of left sided neck jerking seen on subsequent exams showed no EEG correlate. EEG on 319 was most consistent with a severe diffuse encephalopathy, that was non-specific as to etiology with contribution from sedating medication effect not excludable; there was a degree of improvement compared to previous epoch due to the presence of partial arousals; EEG on 3/19 continued to be negative for epileptiform discharges, seizures, or focal/lateralizing signs. He has now been off of sedation for >11 days with no significant improvement on Neurological examination. 1. Neurology team spoke with family in detail yesterday and answered all questions from a neurological perspective. 2. His renal function continues to be impaired and white cell count has been significantly higher than baseline. 3. Initially, prolonged use of propofol/Versed and cefepime toxicity were felt to be the most likely etioologies for his continued encephalopathy. Based on lack of improvement neurologically, his severely deranged renal function (BUN/Cr of 164/3.37) in addition to sequelae of possible prolonged subclinical seizure activity, are felt to be the most likely  contributing factors in his encephalopathy. 4. EEG on 3/27: This is an abnormal electroencephalogram secondary to general background slowing and GPDs of triphasic morphology at a frequency of one every 1-2 seconds.  There is no change in clinical activity during the recording.   5. Myeloproliferative disorder. WBC today is 41, which is significantly increased relative to his baseline of 20's-30's, but lower than it was earlier this admission. 6. Per nephrology consult, there are significant metabolic disturbances secondary to acute kidney injury, including possible uremia and gap and mixed anion-gap and non-anion gap metabolic acidosis. His baseline Cr level is 1.4.   Impression: # Toxic metabolic encephalopathy-multifactorial-impaired renal function/uremia, prolonged sedation with Versed and propofol which was discontinued 10 days ago. 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  - Management of renal failure per primary team - Management of myeloproliferative disorder per primary team-would be beneficial to touch base with hematology.  40 minutes spent in the neurological evaluation and management of this critically ill patient   LOS: 22 days   '@Electronically'$  signed: Dr. Kerney Elbe 08/15/2018  9:02 AM

## 2018-08-15 NOTE — Procedures (Addendum)
Hemodialysis Catheter Insertion Procedure Note Juan [name] [id number] 04/05/1941  Procedure: Insertion of Hemodialysis Catheter Indications: Dialysis Access   Procedure Details Consent: Risks of procedure as well as the alternatives and risks of each were explained to the (patient/caregiver).  Consent for procedure obtained. Time Out: Verified patient identification, verified procedure, site/side was marked, verified correct patient position, special equipment/implants available, medications/allergies/relevent history reviewed, required imaging and test results available.  Performed  Maximum sterile technique was used including antiseptics, cap, gloves, gown, hand hygiene, mask and sheet. Skin prep: Chlorhexidine; local anesthetic administered 3 ml Lido 1% Triple lumen hemodialysis catheter was inserted into left internal jugular vein using the Seldinger technique to 20 cm.  Line sutured.  Biopatch and sterile dressing applied.    Evaluation Blood flow good Complications: No apparent complications Patient did tolerate procedure well. Chest X-ray ordered to verify placement.  CXR: pending.  RN to instill heparin into ports   Procedure performed with ultrasound guidance for real time vessel cannulation.     Kennieth Rad, MSN, AGACNP-BC [facility] Pgr: [phone] or if no answer [phone] 08/15/2018, 4:00 PM

## 2018-08-16 ENCOUNTER — Encounter (HOSPITAL_COMMUNITY): Payer: Self-pay

## 2018-08-16 DIAGNOSIS — Z515 Encounter for palliative care: Secondary | ICD-10-CM

## 2018-08-16 LAB — RENAL FUNCTION PANEL
ANION GAP: 12 (ref 5–15)
Albumin: 1.1 g/dL — ABNORMAL LOW (ref 3.5–5.0)
Anion gap: 15 (ref 5–15)
BUN: 132 mg/dL — AB (ref 8–23)
BUN: >100 mg/dL — ABNORMAL HIGH (ref 8–23)
CO2: 15 mmol/L — ABNORMAL LOW (ref 22–32)
CO2: 19 mmol/L — ABNORMAL LOW (ref 22–32)
Calcium: 7.5 mg/dL — ABNORMAL LOW (ref 8.9–10.3)
Calcium: 7.7 mg/dL — ABNORMAL LOW (ref 8.9–10.3)
Chloride: 104 mmol/L (ref 98–111)
Chloride: 102 mmol/L (ref 98–111)
Creatinine, Ser: 2.55 mg/dL — ABNORMAL HIGH (ref 0.61–1.24)
Creatinine, Ser: 2.13 mg/dL — ABNORMAL HIGH (ref 0.61–1.24)
GFR calc Af Amer: 27 mL/min — ABNORMAL LOW (ref >60)
GFR calc Af Amer: 34 mL/min — ABNORMAL LOW (ref >60)
GFR calc non Af Amer: 29 mL/min — ABNORMAL LOW (ref >60)
GFR, EST NON AFRICAN AMERICAN: 23 mL/min — AB (ref >60)
Glucose, Bld: 140 mg/dL — ABNORMAL HIGH (ref 70–99)
Glucose, Bld: 129 mg/dL — ABNORMAL HIGH (ref 70–99)
POTASSIUM: 3.7 mmol/L (ref 3.5–5.1)
Phosphorus: 4.2 mg/dL (ref 2.5–4.6)
Phosphorus: 3.4 mg/dL (ref 2.5–4.6)
Potassium: 3.9 mmol/L (ref 3.5–5.1)
SODIUM: 133 mmol/L — AB (ref 135–145)
Sodium: 134 mmol/L — ABNORMAL LOW (ref 135–145)

## 2018-08-16 LAB — GLUCOSE, CAPILLARY
Glucose-Capillary: 102 mg/dL — ABNORMAL HIGH (ref 70–99)
Glucose-Capillary: 105 mg/dL — ABNORMAL HIGH (ref 70–99)
Glucose-Capillary: 106 mg/dL — ABNORMAL HIGH (ref 70–99)
Glucose-Capillary: 113 mg/dL — ABNORMAL HIGH (ref 70–99)
Glucose-Capillary: 117 mg/dL — ABNORMAL HIGH (ref 70–99)
Glucose-Capillary: 119 mg/dL — ABNORMAL HIGH (ref 70–99)
Glucose-Capillary: 120 mg/dL — ABNORMAL HIGH (ref 70–99)
Glucose-Capillary: 124 mg/dL — ABNORMAL HIGH (ref 70–99)
Glucose-Capillary: 136 mg/dL — ABNORMAL HIGH (ref 70–99)

## 2018-08-16 LAB — CBC
HCT: 24 % — ABNORMAL LOW (ref 39.0–52.0)
Hemoglobin: 7.3 g/dL — ABNORMAL LOW (ref 13.0–17.0)
MCH: 24.9 pg — ABNORMAL LOW (ref 26.0–34.0)
MCHC: 30.4 g/dL (ref 30.0–36.0)
MCV: 81.9 fL (ref 80.0–100.0)
Platelets: 144 10*3/uL — ABNORMAL LOW (ref 150–400)
RBC: 2.93 MIL/uL — ABNORMAL LOW (ref 4.22–5.81)
RDW: 20.1 % — ABNORMAL HIGH (ref 11.5–15.5)
WBC: 32.1 10*3/uL — AB (ref 4.0–10.5)
nRBC: 0.6 % — ABNORMAL HIGH (ref 0.0–0.2)

## 2018-08-16 LAB — CULTURE, RESPIRATORY W GRAM STAIN

## 2018-08-16 LAB — MAGNESIUM: MAGNESIUM: 2 mg/dL (ref 1.7–2.4)

## 2018-08-16 LAB — VALPROIC ACID LEVEL: Valproic Acid Lvl: 18 ug/mL — ABNORMAL LOW (ref 50.0–100.0)

## 2018-08-16 MED ORDER — AMIODARONE IV BOLUS ONLY 150 MG/100ML
150.0000 mg | Freq: Once | INTRAVENOUS | Status: AC
  Administered 2018-08-16: 150 mg via INTRAVENOUS
  Filled 2018-08-16: qty 100

## 2018-08-16 MED ORDER — METOPROLOL TARTRATE 5 MG/5ML IV SOLN
2.5000 mg | Freq: Once | INTRAVENOUS | Status: AC
  Administered 2018-08-16: 2.5 mg via INTRAVENOUS

## 2018-08-16 MED ORDER — SODIUM CHLORIDE 0.9 % IV SOLN
1.0000 g | Freq: Two times a day (BID) | INTRAVENOUS | Status: DC
  Administered 2018-08-16 – 2018-08-17 (×4): 1 g via INTRAVENOUS
  Filled 2018-08-16 (×7): qty 1

## 2018-08-16 MED ORDER — METOPROLOL TARTRATE 5 MG/5ML IV SOLN
INTRAVENOUS | Status: AC
  Administered 2018-08-16: 2.5 mg via INTRAVENOUS
  Filled 2018-08-16: qty 5

## 2018-08-16 NOTE — Progress Notes (Signed)
Per renal MD, decrease removal goal to 50.

## 2018-08-16 NOTE — Progress Notes (Signed)
Order obtained for low dose iv lopressor for afib rvr. Noted dyssynchrony with the vent. Suctioned thick moderate tan secretion. Monitor.

## 2018-08-16 NOTE — Progress Notes (Signed)
NAME:  Juan [name], MRN:  [id number], DOB:  05-04-1941, LOS: 23 ADMISSION DATE:  07/24/2018, CONSULTATION DATE:  3/13 REFERRING MD:  Lupita Leash, CHIEF COMPLAINT:  Acute encephalopathy and worsening mental status    Brief History   78 year old male patient admitted initially on 3/7 with sepsis secondary secondary to pseudomonas urinary tract infection with associated acute on chronic renal failure secondary to obstructive uropathy.  Over the course of his hospitalization developed progressive encephalopathy, with concern for nonconvulsive status on 3/12.  This was felt possibly secondary to neurotoxicity from cefepime.  Intubated for airway protection. He was initially under propofol, and transition to Versed due to severe bradycardia on 3/16, Versed was stopped 3/17.  Past Medical History  Chronic urinary retention with prostate hypertrophy, prior Pseudomonas urinary tract infections, stage III chronic kidney disease, moderate to severe pulmonary hypertension, chronic diastolic heart failure, myelodysplasia with myelodysplastic syndrome, chronic back pain  Significant Hospital Events   3/7: Admitted with presumptive diagnosis of urinary tract infection 3/9: worsening renal failure, seen by urology, Foley catheter placed 3/12: Worsening mental status, EEG worrisome for possible nonconvulsive status.  Felt possibly due to neurotoxicity from cefepime this was discontinued and Zosyn was started.  lumbar puncture performed, for possible meningitis.  Infectious disease consulted, neurology consulted, started on Keppra, moved to [facility] for continuous EEG monitoring 3/13 remains unresponsive, developed worsening hypoxia staph from prior LP unremarkable without evidence of meningitis.  Pulmonary asked to evaluate 3/17 Episode of bradycardia overnight in spite of stopping propofol hence dopamine started. Episode of mucus plugging 3/17 versed stopped 3/24>> No improvement in neuro status despite off sedation s 3/26 >>  overnight with low hgb s/p transfusion, Afib w/RVR, hypotensive requiring pressors, oliguria 3/28 -A Fib RVR -Amio bolus x 1 , weaning pressors   Consults:  ID Neurology Urology PCCM  Procedures:  3/12 Fluoro-guided LP performed at L3-L4. Opening pressure 12 cm H20. 13.5 ml clear CSF obtained 3/13 ETT >> 3/17 R DL PICC >> 3/31 L IJ trialysis >>  Significant Diagnostic Tests:  CT abdomen pelvis 3/7 showed bilateral hydronephrosis with bladder wall thickening and multiple bladder diverticuli suggesting chronic bladder outlet obstruction with cystitis  Renal US 3/10  resolved bilateral hydronephrosis   MRI brain 3/12: Truncated and motion degraded examination without visible acute abnormality.  MRI L spine 3/12:1. Mild foraminal narrowing bilaterally at L4-5 is worse on the left.2. Chronic endplate changes at D4-2 with associated Schmorl's node. 3. Minimal rightward disc bulging without significant stenosis.4. 10 mm solid lesion at the upper pole of the right kidney was not well seen on recent CT scans. Small renal cell carcinoma is not excluded. Recommend dedicated MRI of the kidneys as the patient's condition allows. 5. Stable cyst at the lower pole of the right kidney.  EEG 3/12 frequent periodic discharges concerning for triphasic versus epileptiform GPD's 3/17 EEG >>burst suppression, triphasics GPD's US abdomen 3/12: 1. No acute findings. 2. Single visualized gallstone. No evidence of acute cholecystitis. No other abnormality.  MRI brain 3/21 neg  CT Head 3/24 No acute infarct, no hemorrhage no edema. Small lipoma between lateral ventricles, mild periventricular small vessel disease  CXR 3/25 Moderate pulmonary vascular congestion, low lung volumes.   CXR 3/27 stable ETT/ gastric; bilateral LL atelectasis, multiple lucent areas in the bony structures raise the concern for possible degree of underlying multiple myeloma  Micro Data:  UC 3/7 pseudomonas (cipro  resistant) Urine cx 3/12 >> pseudomonas BCx2 3/8>>> neg BCX2 3/12>>>neg CSF 3/12: neg resp  culture 3/13>>>nml flora 3/16 urine >> pseudomonas resp 3/18 >> providencia, multidrug-resistant (S ceftriaxone) 3/25 urine cultures negative 3/25 trach asp >> normal flora 3/25 BC x 2 >> ngtd >> 3/29 Trach asp>> few providencia stuartii  Antimicrobials:  Cefepime 3/7>>3/12 Zosyn 3/12>>> 3/19 zyvox 3/13>>>3/15 ceftx 3/21 (HCAP ) >> 3/27 Zosyn 3/29 >>3/31 Meropenum 3/31 >>  Interim history/subjective:  Tmax 96.6  Objective   Blood pressure 105/62, pulse 86, temperature 98.2 F (36.8 C), resp. rate 20, height _0  (1.905 m), weight 108.7 kg, SpO2 97 %.    Vent Mode: PRVC FiO2 (%):  [40 %-50 %] 40 % Set Rate:  [16 bmp] 16 bmp Vt Set:  [670 mL] 670 mL PEEP:  [5 cmH20] 5 cmH20 Plateau Pressure:  [13 cmH20-17 cmH20] 17 cmH20   Intake/Output Summary (Last 24 hours) at 08/16/2018 1008 Last data filed at 08/16/2018 0900 Gross per 24 hour  Intake 4747.02 ml  Output 5257 ml  Net -509.98 ml   Filed Weights   08/14/18 0500 08/15/18 0449 08/16/18 0500  Weight: 99 kg 108.3 kg 108.7 kg   Examination:  General:  Critically ill appearing male on MV in NAD HEENT: MM pink/moist, ETT/ OGT, pupils 4/reactive Neuro: unresponsive  CV: irir, no murmur PULM: even/non-labored, lungs bilaterally coarse ZO:XWRU, bs active  Extremities: warm/dry, anasarca  Skin: no rashes  Resolved Hospital Problem list   Transient bradycardia-due to propofol, resolved Hyperchloremia and Non-anion gap metabolic acidosis, resolved Hypertension  Assessment & Plan:   Acute Toxic /Metabolic Encephalopathy  Presumed nonconvulsive status +/- cefepime neurotoxicity-resolved on EEG  S/p 8 hours of burst suppression on 3/16, all sedation off since 3/17, has since remained comatose EEG 3/27 abnormal EEG with general background slowing, no evidence of seizures, indicative of severe encephalopathy. Plan Appreciate  neurology's input Continue current AED per Neurology- keppra/ depakote/ vimpat Ongoing neuro exams Prn ativan for seizure  Holding all sedation Appears to be a poor prognosis CRRT for uremia  Goal is for 3 day trial of CRRT to see if any improvement   Acute hypoxic respiratory failure Providencia HCAP Underlying severe PH and OSA - intermittent episodes of mucous plugging reported- resolved Plan Full MV support Daily SBT, mental status is a barrier to extubation Intubated since 3/13-> needs trach    Hypotension- improved - likely related to rapid Afib/ RVR, slow progression of anemia and third spacing P:  Transfuse for Hgb <7 Now off neo, goal if for MAP>65 Amio for afib  New Onset Afib- now SR post transfusion - possibly related to anemia and fever  P:  Tele monitoring Fever control Cont on Amio   Acute on chronic renal failure (st III CKD) 2/2 chronic urinary retention w/ h/o poorly functioning bladder, enlarged prostate and mild to mod obstruction. Has had multiple recurrent UTIs. Has not completed prior treatments in past d/t financial restraints.  Worsening uremia/ AKI- Solid lesion on upper pole of right kidney.  Hypokalemia-resolved  Hypernatremia-resolved   Lab Results  Component Value Date   CREATININE 2.55 (H) 08/16/2018   CREATININE 3.57 (H) 08/15/2018   CREATININE 3.37 (H) 08/15/2018   CREATININE 1.41 (H) 06/10/2018   CREATININE 1.48 (H) 04/08/2018   CREATININE 1.50 (H) 03/02/2018   CREATININE 1.3 (H) 03/30/2017   CREATININE 1.3 (H) 07/30/2014   CREATININE 1.2 11/16/2013    Recent Labs  Lab 08/15/18 0436 08/15/18 1636 08/16/18 0500  K 4.6 4.5 3.7   Recent Labs  Lab 08/15/18 0436 08/15/18 1636 08/16/18 0500  NA  133* 132* 81*   Plan Per Nephrology Stop free water  Providencia HCAP with new infiltrate Complicated Pseudomonas Urinary tract infection in setting of obstructive uropathy-chronic Foley catheter, colonizer per ID, treated with  10 days of antibiotics total Ongoing severe leukocytosis- improving  Ongoing fever Plan D/c zosyn- based on sensitives, start meropenum  Trend WBC/ fever curve  Fever has since resolved since CRRT- ?componet of drug fever  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   CT abdomen imaging also noting innumerable lytic lesions involving the spine Leukocytosis- baseline 22k range Plan trending CBC  Iron def anemia Plan Transfuse for Hgb < 7 Observe for bleeding   Global:  Continuing with 3 day trial of CRRT to see if any neurological improvement.  Pending PMT consult to help with family and GOCs.   Best practice:  Diet: NPO, TF Pain/Anxiety/Delirium protocol (if indicated): Off all sedation since 3/17 VAP protocol (if indicated): 3/13 DVT prophylaxis: SCDs GI prophylaxis: H2B Glucose control: ssi Mobilitity BR Code Status: full code  Family Communication:  Will update daughter by phone.  Disposition:  ICU    CCT 30 mins  Kennieth Rad, MSN, AGACNP-BC  Pulmonary & Critical Care Pgr: [phone] or if no answer [phone] 08/16/2018, 10:08 AM  '

## 2018-08-16 NOTE — Progress Notes (Signed)
RVR sustaining to 140. Order obtained for 150 mg amio bolus. Given now. VS otherwise unchanged/stable

## 2018-08-16 NOTE — Progress Notes (Signed)
Subjective: Interval History: Nonresponsive, on vent  Objective: Vital signs in last 24 hours: Temp:  [96.4 F (35.8 C)-98.6 F (37 C)] 98.6 F (37 C) (03/31 0800) Pulse Rate:  [38-120] 85 (03/31 0800) Resp:  [14-25] 21 (03/31 0800) BP: (83-143)/(44-94) 103/66 (03/31 0800) SpO2:  [90 %-100 %] 96 % (03/31 0800) FiO2 (%):  [40 %-50 %] 40 % (03/31 0800) Weight:  [108.7 kg] 108.7 kg (03/31 0500) Weight change: 0.4 kg  Intake/Output from previous day: 03/30 0701 - 03/31 0700 In: 5750.2 [I.V.:3311.2; NG/GT:2200; IV Piggyback:239] Out: 6962 [Urine:130; Stool:250] Intake/Output this shift: Total I/O In: 176.5 [I.V.:126.5; NG/GT:50] Out: 403 [Other:403]  General appearance: on vent, not responsive Neck: HD cath Resp: diminished breath sounds bilaterally and rales bibasilar Cardio: irregularly irregular rhythm and systolic murmur: holosystolic 2/6, blowing at apex GI: pos bs, soft,  Extremities: edema 2-3+  Lab Results: Recent Labs    08/15/18 0436 08/16/18 0500  WBC 41.0* 32.1*  HGB 7.0* 7.3*  HCT 22.6* 24.0*  PLT 149* 144*   BMET:  Recent Labs    08/15/18 1636 08/16/18 0500  NA 132* 134*  K 4.5 3.7  CL 101 104  CO2 14* 15*  GLUCOSE 140* 140*  BUN 174* 132*  CREATININE 3.57* 2.55*  CALCIUM 8.6* 7.5*   No results for input(s): PTH in the last 72 hours. Iron Studies: No results for input(s): IRON, TIBC, TRANSFERRIN, FERRITIN in the last 72 hours.  Studies/Results: Dg Chest Port 1 View  Result Date: 08/15/2018 CLINICAL DATA:  Central line placement. EXAM: PORTABLE CHEST 1 VIEW COMPARISON:  08/15/2018 at 6:33 a.m. FINDINGS: New left internal jugular central venous line has its tip projecting in the mid superior vena cava. No pneumothorax. No change in lung aeration.  No new lung abnormalities. Endotracheal tube and nasal/orogastric tube are stable and well positioned. IMPRESSION: 1. Left internal jugular central venous line tip projects in the mid superior vena cava.  No pneumothorax. 2. No other change from the earlier exam. Electronically Signed   By: Lajean Manes M.D.   On: 08/15/2018 16:09   Dg Chest Port 1 View  Result Date: 08/15/2018 CLINICAL DATA:  Respiratory failure.  Intubated patient. EXAM: PORTABLE CHEST 1 VIEW COMPARISON:  Single-view of the chest 08/14/2018 and 08/12/2018. FINDINGS: NG tube, right PICC and ET tube are unchanged. Hazy airspace disease in the left mid lung zone is improved since the most recent study. Heart size is enlarged. Atherosclerosis noted. No pneumothorax or pleural effusion. IMPRESSION: Improved airspace disease in left mid lung zone. No new abnormality. Electronically Signed   By: Inge Rise M.D.   On: 08/15/2018 07:58    I have reviewed the patient's current medications.  Assessment/Plan: 1 AKI  etio ? Drugs, infx,low bps. Now off pressors. Little urine. CRRT started late. Slow decrease BUN, solute. Will stop Iv bicarb, ^ effic of CRRT 2 VDRF per CCM 3 Enceph not likely uremia, ? Other 4 UTIS check UA 5 Obstructive uropathy 6 COPD 7 AFib on Amio 8 Myelofibrosis 9 CM 10 Pulm HTn 11 GOUT 12 Obesity P CRRt, ^ flow, lessen vol off. AB  EOL discussions   LOS: 23 days   Juan [name] 08/16/2018,8:14 AM

## 2018-08-16 NOTE — Consult Note (Addendum)
Consultation Note Date: 08/16/2018   Patient Name: Juan [name]  DOB: 1940-12-11  MRN: [id number]  Age / Sex: 78 y.o., male  PCP: Colon Branch, MD Referring Physician: Chesley Mires, MD  Reason for Consultation: Establishing goals of care and Psychosocial/spiritual support    Addendum:  This evening from 6:30- 7:00 pm I enjoyed a good conversation with Juan [name] (the patient's daughter).  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  He has always been a very private and spiritual man.  He lived with his youngest son and was running errands just a few days prior to admission.  We discussed how strong he was to have been living with innumerable lytic bone lesions, severe pulmonary HTN, recurrent MDR UTI infections that hospitalized him and now renal failure.  Juan [name] asked detailed questions about what was involved in Trach/PEG LTAC and comfort care would look like.  She asked specifically what medications would be used.  She questioned whether the medications we give would decrease his respirations - I responded that the medications would only decrease his respirations if he was suffering or breathing too fast.  Per Juan [name] if CRRT has not made a positive impact on his health by Thursday (mental status and breathing) then the family would make a decision between long term life support (trach/PEG) and comfort measures.    HPI/Patient Profile: 78 y.o. male  with past medical history of recurrent MDR urinary tract infections causing frequent hospitalizations (8 in the past year), CKD 4, MDS with chronically elevated WBC, chronic respiratory failure with pulmonary HTN, AAA, DHF, and innumerable lytic lesions on imaging who was admitted on 07/17/2018 with dysuria and back pain.  Work up revealed complicated UTI, bilateral hydronephrosis with bladder outlet obstruction.   During his hospitalization he developed worsening mental status and was felt to be in non-convulsive status.  He required intubation on 3/13.  At the time of this consult 3/31 he remains intubated, he is non-responsive, and on CRRT.  The plan is for him to receive a 3 day trial of CRRT.   Clinical Assessment and Goals of Care:  I have reviewed medical records including EPIC notes, labs and imaging, received report from the bedside RN, assessed the patient attempted to call his family on the phone  to discuss diagnosis prognosis, GOC, EOL wishes, disposition and options.  Palliative medicine has met with [name] and his family multiple times since 2015.  He has 4 children who make decisions as a team as Juan [name] has been unable to consistently make health care decisions for several years.  The 4 children have insisted on full scope of treatment in the past.    I spoke with Juan [name] briefly on the phone.  She stated "we are not interested in talking to Palliative yet.  We want 2 or 3 more days."  I explained that PMT is attempting to provide extra support and communication for patients while families are not able to visit.  Juan [name]  referred me to her sister Juan [name] but did state that all 4 siblings work 4 time so that she may not pick up the phone.  I asked if we could go ahead set up an appointment to talk in the next day or two.  Juan [name] insisted that it could not be done before Saturday.  I attempted to call Jarold [name] but my call went unanswered.  As far as functional and nutritional status Juan [name] is currently vented (but weaning) and non-responsive.  His albumin is less than 1.0.  PMT will continue to reach out to the family with the goal of scheduling a group meeting for Thursday (after the 3 day trial of CRRT is completed).   Primary Decision Maker:  NEXT OF KIN 4 children as a unit.    SUMMARY OF RECOMMENDATIONS    Continue current care.    Would not offer additional interventions  to the family unless they could improve the patient's overall outlook.  He is a very poor candidate to be coded given his very poor baseline status, MDS, AAA, and innumerable lytic lesions.  It would seem a code could only harm him and would not give him any benefit.  I am very concerned he would be a poor trach candidate given an albumin of < 1.0 (would the wound heal?)  Code Status/Advance Care Planning:  Full code   Additional Recommendations (Limitations, Scope, Preferences):  Full Scope Treatment  Palliative Prophylaxis:   Delirium Protocol  Psycho-social/Spiritual:   Desire for further Chaplaincy support:  Yes   Prognosis:  Very poor.  Should comfort measures be chosen the patient would likely have hours to days.   Discharge Planning: Anticipated Hospital Death      Primary Diagnoses: Present on Admission: . Chronic diastolic CHF (congestive heart failure), NYHA class 2 ([place]) . Chronic respiratory failure with hypoxia ([place]) . CKD (chronic kidney disease) stage 3, GFR 30-59 ml/min ([facility]) . Coronary atherosclerosis . HTN (hypertension) . Hydronephrosis . Iron deficiency anemia due to chronic blood loss . Moderate to severe pulmonary hypertension ([place]) . Myelodysplasia (myelodysplastic syndrome) ([place]) . Normocytic anemia . Hypokalemia . Complicated UTI (urinary tract infection) . Back pain   I have reviewed the medical record, interviewed the patient and family, and examined the patient. The following aspects are pertinent.  Past Medical History:  Diagnosis Date  . Allergic rhinitis   . Anemia   . Ascending aortic aneurysm ([place]) 03/2014   4.3cm on CT scan  . CAD (coronary artery disease)    dx elsewheer in past, no documentation. Non-ischemic myoview 2007  . Chronic diastolic CHF (congestive heart failure), NYHA class 2 ([facility])    Normal EF w/ grade 1 dd by echo 12/2015  . Chronic respiratory failure with hypoxia/2L at home    "2L; all the time"  (05/03/2017)  . CKD (chronic kidney disease), stage III ([place])   . Edema    R>L leg, u/s 5-12 neg for DVT  . Hemorrhoid   . History of hydronephrosis   . History of Splenic infarct   . History of thrombocytosis   . Hypertension   . Iron deficiency anemia due to chronic blood loss 03/31/2017  . 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 Migraine    "once/wk at least" (07/11/2013)  . Moderate to severe pulmonary hypertension ([place])   . Myeloproliferative disease ([place])   . Pyelonephritis 04/29/2017  . Recurrent Pseudomonas urinary tract infection   . Shortness of breath   . Sinus congestion   .  Sleep apnea, obstructive    at some point used CPAP, was d/c  years ago  . Type II diabetes mellitus ([place])   . UTI (urinary tract infection) 06/2017  . UTI (urinary tract infection) 07/2017   Social History   Socioeconomic History  . Marital status: Widowed    Spouse name: Not on file  . Number of children: 2  . Years of education: Not on file  . Highest education level: Not on file  Occupational History  . Occupation: retired     Fish farm manager: RETIRED  Social Needs  . Financial resource strain: Not on file  . Food insecurity:    Worry: Not on file    Inability: Not on file  . Transportation needs:    Medical: Not on file    Non-medical: Not on file  Tobacco Use  . Smoking status: Former Smoker    Packs/day: 0.25    Years: 12.00    Pack years: 3.00    Types: Cigarettes    Last attempt to quit: 05/18/1966    Years since quitting: 52.2  . Smokeless tobacco: Never Used  . Tobacco comment: quit smoking 45 years ago  Substance and Sexual Activity  . Alcohol use: No    Alcohol/week: 0.0 standard drinks  . Drug use: No  . Sexual activity: Not Currently  Lifestyle  . Physical activity:    Days per week: Not on file    Minutes per session: Not on file  . Stress: Not on file  Relationships  . Social connections:    Talks on phone: Not on file    Gets together: Not on file    Attends  religious service: Not on file    Active member of club or organization: Not on file    Attends meetings of clubs or organizations: Not on file    Relationship status: Not on file  Other Topics Concern  . Not on file  Social History Narrative   Moved from [place] 2006   Widow 2007   Son lives with him    Lives in an apartment , first floor     Former smoker-stopped 1968   Alcohol none   Full Code   Family History  Problem Relation Age of Onset  . Schizophrenia Son   . Mental illness Daughter   . Colon cancer Neg Hx   . Prostate cancer Neg Hx   . Heart attack Neg Hx   . Diabetes Neg Hx    Scheduled Meds: . sodium chloride   Intravenous Once  . chlorhexidine gluconate (MEDLINE KIT)  15 mL Mouth Rinse BID  . Chlorhexidine Gluconate Cloth  6 each Topical Daily  . clotrimazole  1 application Topical BID  . famotidine  20 mg Per Tube Daily  . feeding supplement (PRO-STAT SUGAR FREE 64)  60 mL Per Tube BID  . hydrocortisone cream  1 application Topical BID  . lacosamide  200 mg Per Tube BID  . levETIRAcetam  500 mg Per Tube BID  . mouth rinse  15 mL Mouth Rinse 10 times per day  . valproic acid  1,000 mg Per Tube QHS  . valproic acid  750 mg Per Tube q morning - 10a   Continuous Infusions: .  prismasol BGK 4/2.5 700 mL/hr at 08/16/18 0815  .  prismasol BGK 4/2.5 700 mL/hr at 08/16/18 0815  . sodium chloride 10 mL/hr at 08/16/18 1100  . amiodarone 30 mg/hr (08/16/18 1100)  . feeding supplement (VITAL 1.5  CAL) 50 mL/hr at 08/16/18 1100  . meropenem (MERREM) IV    . phenylephrine (NEO-SYNEPHRINE) Adult infusion Stopped (08/15/18 2250)  . prismasol BGK 4/2.5 2,000 mL/hr at 08/16/18 0816   PRN Meds:.acetaminophen (TYLENOL) oral liquid 160 mg/5 mL, albuterol, heparin, LORazepam Allergies  Allergen Reactions  . Cefepime Other (See Comments)    Seizures   Review of Systems intubated and non-responsive.  Physical Exam  Well developed male, intubated, non-responsive, on CRRT CV  irreg irreg resp calm, regular, without w/c/r Abdomen soft, active bowel sounds.  Rectal tube in place with diarrhea in bag Ext 3+ edema  Vital Signs: BP 125/72   Pulse 86   Temp 97.7 F (36.5 C)   Resp (!) 21   Ht '6\' 3"'$  (1.905 m)   Wt 108.7 kg   SpO2 97%   BMI 29.95 kg/m  Pain Scale: CPOT POSS *See Group Information*: 4-INTERVENTION REQUIRED,Unacceptable,Somnolent, mininal or no response to verbal and physical stimulation Pain Score: 0-No pain   SpO2: SpO2: 97 % O2 Device:SpO2: 97 % O2 Flow Rate: .O2 Flow Rate (L/min): 10 L/min  IO: Intake/output summary:   Intake/Output Summary (Last 24 hours) at 08/16/2018 1108 Last data filed at 08/16/2018 1100 Gross per 24 hour  Intake 4839.48 ml  Output 5580 ml  Net -740.52 ml    LBM: Last BM Date: 08/16/18 Baseline Weight: Weight: 92.5 kg Most recent weight: Weight: 108.7 kg     Palliative Assessment/Data: 10%     Time In: 10:00 Time Out: 11:10 Time Total: 70 min. Greater than 50%  of this time was spent counseling and coordinating care related to the above assessment and plan.  Signed by: Florentina Jenny, PA-C Palliative Medicine Pager: [phone]  Please contact Palliative Medicine Team phone at [phone] for questions and concerns.  For individual provider: See Shea Evans

## 2018-08-16 NOTE — Progress Notes (Signed)
Pharmacy Antibiotic Note  Aveer Bartow is a 78 y.o. male admitted on 07/29/2018 with sepsis 2/2 Pseudomonas UTI, then received treatment with Zosyn for providencia HAP. Pt started on Zosyn yesterday with worsening congestion on CXR and fevers. CRRT started yesterday. Providencia growing in sputum with sensitivities returned. As it appears to have failed therapy with rocephin previously will transition to meropenem - will need to monitor valproate levels closely.  Plan: -Stop Zosyn -Begin meropenem 1g IV q12h with CVVHD  Height: 6\' 3"  (190.5 cm) Weight: 239 lb 10.2 oz (108.7 kg) IBW/kg (Calculated) : 84.5  Temp (24hrs), Avg:97.9 F (36.6 C), Min:96.4 F (35.8 C), Max:98.6 F (37 C)  Recent Labs  Lab 08/12/18 0316 08/13/18 0533 08/13/18 0721 08/14/18 0347 08/15/18 0436 08/15/18 1636 08/16/18 0500  WBC 44.8* 44.4*  --  49.1* 41.0*  --  32.1*  CREATININE 2.07*  --  2.47* 3.21* 3.37* 3.57* 2.55*    Estimated Creatinine Clearance: 32.8 mL/min (A) (by C-G formula based on SCr of 2.55 mg/dL (H)).    Allergies  Allergen Reactions  . Cefepime Other (See Comments)    Seizures   Antimicrobials this admission: CTX 3/7 x1 ED; 3/21 >> 3/27 Cefepime 3/8 >> 3/12 (D/C cefepime for seizures) Zosyn 3/13>>14; 3/16 >> 3/19; 3/30 >>3/31 Zyvox 3/13 >>3/16 Meropenem 3/31 >>  Microbiology results: 3/8 BCx: NG final 3/7 UCx: >100k Pseudomonas aeruginosa 3/8 MRSA PCR: negative 3/12 CSF cx: NG final 3/12 BCx: NG final 3/12 MRSA PCR: negative 3/12 UCx: Pseudomonas aeruginosa 3/13 TA Cx: normal flora 3/16 UCx: Pseudomonas aeurginosa 3/18 TA Cx: Providencia stuartii 3/20 C. diff: negative 3/25 TA cx: normal flora 3/25 UCx: NGF 3/25 BCx: NG x4 3/29 TA Cx: providencia  Thank you for allowing pharmacy to be a part of this patient's care.   Arrie Senate, PharmD, BCPS Clinical Pharmacist [phone] Please check AMION for all [place] numbers 08/16/2018

## 2018-08-17 DIAGNOSIS — N1330 Unspecified hydronephrosis: Secondary | ICD-10-CM

## 2018-08-17 DIAGNOSIS — I480 Paroxysmal atrial fibrillation: Secondary | ICD-10-CM

## 2018-08-17 LAB — RENAL FUNCTION PANEL
Albumin: 1.1 g/dL — ABNORMAL LOW (ref 3.5–5.0)
Albumin: 1.1 g/dL — ABNORMAL LOW (ref 3.5–5.0)
Anion gap: 12 (ref 5–15)
Anion gap: 11 (ref 5–15)
BUN: 72 mg/dL — ABNORMAL HIGH (ref 8–23)
BUN: 50 mg/dL — ABNORMAL HIGH (ref 8–23)
CHLORIDE: 100 mmol/L (ref 98–111)
CO2: 21 mmol/L — ABNORMAL LOW (ref 22–32)
CO2: 23 mmol/L (ref 22–32)
Calcium: 8 mg/dL — ABNORMAL LOW (ref 8.9–10.3)
Calcium: 8 mg/dL — ABNORMAL LOW (ref 8.9–10.3)
Chloride: 101 mmol/L (ref 98–111)
Creatinine, Ser: 1.61 mg/dL — ABNORMAL HIGH (ref 0.61–1.24)
Creatinine, Ser: 1.23 mg/dL (ref 0.61–1.24)
GFR calc Af Amer: 47 mL/min — ABNORMAL LOW (ref >60)
GFR calc Af Amer: >60 mL/min (ref >60)
GFR calc non Af Amer: 41 mL/min — ABNORMAL LOW (ref >60)
GFR calc non Af Amer: 57 mL/min — ABNORMAL LOW (ref >60)
GLUCOSE: 146 mg/dL — AB (ref 70–99)
Glucose, Bld: 116 mg/dL — ABNORMAL HIGH (ref 70–99)
Phosphorus: 2.5 mg/dL (ref 2.5–4.6)
Phosphorus: 2.2 mg/dL — ABNORMAL LOW (ref 2.5–4.6)
Potassium: 4 mmol/L (ref 3.5–5.1)
Potassium: 3.7 mmol/L (ref 3.5–5.1)
Sodium: 133 mmol/L — ABNORMAL LOW (ref 135–145)
Sodium: 135 mmol/L (ref 135–145)

## 2018-08-17 LAB — CBC
HCT: 23.7 % — ABNORMAL LOW (ref 39.0–52.0)
HEMOGLOBIN: 7.8 g/dL — AB (ref 13.0–17.0)
MCH: 26 pg (ref 26.0–34.0)
MCHC: 32.9 g/dL (ref 30.0–36.0)
MCV: 79 fL — ABNORMAL LOW (ref 80.0–100.0)
Platelets: 147 10*3/uL — ABNORMAL LOW (ref 150–400)
RBC: 3 MIL/uL — ABNORMAL LOW (ref 4.22–5.81)
RDW: 19.9 % — ABNORMAL HIGH (ref 11.5–15.5)
WBC: 37.6 10*3/uL — ABNORMAL HIGH (ref 4.0–10.5)
nRBC: 0.4 % — ABNORMAL HIGH (ref 0.0–0.2)

## 2018-08-17 LAB — MAGNESIUM: Magnesium: 2.2 mg/dL (ref 1.7–2.4)

## 2018-08-17 LAB — POCT ACTIVATED CLOTTING TIME
Activated Clotting Time: 153 seconds
Activated Clotting Time: 147 seconds
Activated Clotting Time: 142 seconds
Activated Clotting Time: 153 seconds
Activated Clotting Time: 142 seconds
Activated Clotting Time: 120 seconds
Activated Clotting Time: 158 seconds
Activated Clotting Time: 153 seconds
Activated Clotting Time: 158 seconds
Activated Clotting Time: 147 seconds
Activated Clotting Time: 164 seconds
Activated Clotting Time: 158 seconds
Activated Clotting Time: 186 seconds
Activated Clotting Time: 191 seconds
Activated Clotting Time: 186 seconds

## 2018-08-17 LAB — GLUCOSE, CAPILLARY
Glucose-Capillary: 110 mg/dL — ABNORMAL HIGH (ref 70–99)
Glucose-Capillary: 109 mg/dL — ABNORMAL HIGH (ref 70–99)
Glucose-Capillary: 231 mg/dL — ABNORMAL HIGH (ref 70–99)
Glucose-Capillary: 156 mg/dL — ABNORMAL HIGH (ref 70–99)
Glucose-Capillary: 172 mg/dL — ABNORMAL HIGH (ref 70–99)

## 2018-08-17 LAB — VALPROIC ACID LEVEL: Valproic Acid Lvl: 15 ug/mL — ABNORMAL LOW (ref 50.0–100.0)

## 2018-08-17 MED ORDER — AMIODARONE IV BOLUS ONLY 150 MG/100ML
150.0000 mg | Freq: Once | INTRAVENOUS | Status: AC
  Administered 2018-08-17: 150 mg via INTRAVENOUS
  Filled 2018-08-17: qty 100

## 2018-08-17 MED ORDER — SODIUM CHLORIDE 0.9 % IV SOLN
250.0000 [IU]/h | INTRAVENOUS | Status: DC
  Administered 2018-08-17: 250 [IU]/h via INTRAVENOUS_CENTRAL
  Administered 2018-08-17: 15:00:00 1250 [IU]/h via INTRAVENOUS_CENTRAL
  Administered 2018-08-17: 19:00:00 1700 [IU]/h via INTRAVENOUS_CENTRAL
  Administered 2018-08-17: 21:00:00 1750 [IU]/h via INTRAVENOUS_CENTRAL
  Administered 2018-08-18: 07:00:00 2150 [IU]/h via INTRAVENOUS_CENTRAL
  Administered 2018-08-18: 02:00:00 2000 [IU]/h via INTRAVENOUS_CENTRAL
  Filled 2018-08-17 (×6): qty 2

## 2018-08-17 MED ORDER — NOREPINEPHRINE 4 MG/250ML-% IV SOLN
0.0000 ug/min | INTRAVENOUS | Status: DC
  Administered 2018-08-17: 11:00:00 2 ug/min via INTRAVENOUS
  Administered 2018-08-18: 11:00:00 4 ug/min via INTRAVENOUS
  Administered 2018-08-19: 21:00:00 18 ug/min via INTRAVENOUS
  Administered 2018-08-19: 8 ug/min via INTRAVENOUS
  Administered 2018-08-19: 10 ug/min via INTRAVENOUS
  Administered 2018-08-19: 01:00:00 12 ug/min via INTRAVENOUS
  Administered 2018-08-20: 01:00:00 13 ug/min via INTRAVENOUS
  Administered 2018-08-20: 06:00:00 16 ug/min via INTRAVENOUS
  Filled 2018-08-17 (×9): qty 250

## 2018-08-17 MED ORDER — AMIODARONE LOAD VIA INFUSION
150.0000 mg | Freq: Once | INTRAVENOUS | Status: AC
  Administered 2018-08-17: 11:00:00 150 mg via INTRAVENOUS
  Filled 2018-08-17: qty 83.34

## 2018-08-17 MED ORDER — HEPARIN BOLUS VIA INFUSION (CRRT)
1000.0000 [IU] | INTRAVENOUS | Status: DC | PRN
  Administered 2018-08-17 (×5): 1000 [IU] via INTRAVENOUS_CENTRAL
  Filled 2018-08-17: qty 1000

## 2018-08-17 MED ORDER — CHLORHEXIDINE GLUCONATE 0.12 % MT SOLN
OROMUCOSAL | Status: AC
  Administered 2018-08-17: 15 mL via OROMUCOSAL
  Filled 2018-08-17: qty 15

## 2018-08-17 NOTE — Progress Notes (Signed)
[facility] Progress Note Patient Name: Juan [name] DOB: 09-Aug-1940 MRN: [id number]   Date of Service  08/17/2018  HPI/Events of Note  AFIB with RVR - k+ = 4.0 and Mg++ = 2.2. Currently on an Amiodarone IV infusion.   eICU Interventions  Will order: 1. Bolus with Amiodarone 150 mg IV over 10 minutes now.      Intervention Category Major Interventions: Arrhythmia - evaluation and management  Zephyr Sausedo [place] 08/17/2018, 4:13 AM

## 2018-08-17 NOTE — Progress Notes (Signed)
CPT and SBT held at this time due to pt HR 140s. Will attempt to do at a later time. RN in room with patient.

## 2018-08-17 NOTE — Progress Notes (Signed)
Daily Progress Note   Patient Name: Juan [name]       Date: 08/17/2018 DOB: 1940/09/21  Age: 78 y.o. MRN#: [id number] Attending Physician: Chesley Mires, MD Primary Care Physician: Colon Branch, MD Admit Date: 07/19/2018  Reason for Consultation/Follow-up: Establishing goals of care  Subjective: Discussed with CCM MD and bedside RN.  Patient is unresponsive and remains intubated and on CRRT.  He has had multiple episodes of afib with RVR and subsequent hypotension.  He has been restarted on pressors and he is too unstable to pull off fluid with CRRT.  At this point even if the family decided to trach/PEG the patient is too unstable to do so.  Options have dwindled.  I attempted to call [name], but no one answered.  A time needs to be set for Thursday.  I will attempt to call [name] when she finishes work at 6:30 pm this evening.   Assessment: Patient showing signs of decline including episodes of afib with RVR necessitating amio boluses, hypotension necessitating pressors, he is not tolerating CRRT.  He is still not responsive.  He does not blink to threat.   Patient Profile/HPI:  78 y.o. male  with past medical history of recurrent MDR urinary tract infections causing frequent hospitalizations (8 in the past year), CKD 4, MDS with chronically elevated WBC, chronic respiratory failure with pulmonary HTN, AAA, DHF, and innumerable lytic lesions on imaging who was admitted on 08/07/2018 with dysuria and back pain.  Work up revealed complicated UTI, bilateral hydronephrosis with bladder outlet obstruction.  During his hospitalization he developed worsening mental status and was felt to be in non-convulsive status.  He required intubation on 3/13.  At the time of this consult 3/31 he remains intubated,  he is non-responsive, and on CRRT.  The plan is for him to receive a 3 day trial of CRRT.    Length of Stay: 24  Current Medications: Scheduled Meds:   sodium chloride   Intravenous Once   chlorhexidine gluconate (MEDLINE KIT)  15 mL Mouth Rinse BID   Chlorhexidine Gluconate Cloth  6 each Topical Daily   clotrimazole  1 application Topical BID   famotidine  20 mg Per Tube Daily   feeding supplement (PRO-STAT SUGAR FREE 64)  60 mL Per Tube BID   hydrocortisone cream  1 application  Topical BID   lacosamide  200 mg Per Tube BID   levETIRAcetam  500 mg Per Tube BID   mouth rinse  15 mL Mouth Rinse 10 times per day   valproic acid  1,000 mg Per Tube QHS   valproic acid  750 mg Per Tube q morning - 10a    Continuous Infusions:   prismasol BGK 4/2.5 700 mL/hr at 08/17/18 1421    prismasol BGK 4/2.5 700 mL/hr at 08/17/18 1400   sodium chloride Stopped (08/17/18 0951)   amiodarone 30 mg/hr (08/17/18 1300)   feeding supplement (VITAL 1.5 CAL) 1,000 mL (08/17/18 1117)   heparin 10,000 units/ 20 mL infusion syringe 1,250 Units/hr (08/17/18 1529)   meropenem (MERREM) IV Stopped (08/17/18 1024)   norepinephrine (LEVOPHED) Adult infusion 2 mcg/min (08/17/18 1300)   prismasol BGK 4/2.5 2,000 mL/hr at 08/17/18 1446    PRN Meds: acetaminophen (TYLENOL) oral liquid 160 mg/5 mL, albuterol, heparin, heparin, LORazepam  Physical Exam        Well developed male, obtunded, left eye open but not seeing.  Intubated and on CRRT. CV irreg irreg resp no distress, intubated Abdomen slightly distended, NT Extremities with 3+ edema   Vital Signs: BP 109/60    Pulse (!) 121    Temp (!) 96.4 F (35.8 C)    Resp (!) 24    Ht _0  (1.905 m)    Wt 106.4 kg    SpO2 92%    BMI 29.32 kg/m  SpO2: SpO2: 92 % O2 Device: O2 Device: Ventilator O2 Flow Rate: O2 Flow Rate (L/min): 10 L/min  Intake/output summary:   Intake/Output Summary (Last 24 hours) at 08/17/2018 1535 Last data filed at  08/17/2018 1500 Gross per 24 hour  Intake 2389.68 ml  Output 3651 ml  Net -1261.32 ml   LBM: Last BM Date: 08/17/18 Baseline Weight: Weight: 92.5 kg Most recent weight: Weight: 106.4 kg       Palliative Assessment/Data: 10%      Patient Active Problem List   Diagnosis Date Noted   Palliative care encounter    Acute pulmonary edema (HCC)    Pressure injury of skin 08/05/2018   Acute respiratory failure with hypoxemia (HCC)    Aspiration pneumonia (HCC)    AKI (acute kidney injury) ([place])    Complicated UTI (urinary tract infection) 07/24/2018   Back pain 07/24/2018   Normocytic anemia 06/10/2018   Thrombocytopenia ([place]) 06/10/2018   Recurrent UTI 06/10/2018   Venous insufficiency of lower extremity 06/10/2018   Goals of care, counseling/discussion 03/17/2018   ARF (acute renal failure) ([place]) 09/24/2017   Staphylococcus epidermidis infection    Encephalopathy acute 08/18/2017   Sleep apnea, obstructive 08/18/2017   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   Moderate to severe pulmonary hypertension ([place]) 08/18/2017   Chronic respiratory failure with hypoxia (HCC) 08/18/2017   Chronic diastolic CHF (congestive heart failure), NYHA class 2 ([place]) 08/18/2017   Myelodysplasia (myelodysplastic syndrome) ([place]) 08/18/2017   HTN (hypertension) 08/18/2017   Pyelonephritis 07/17/2017   Hypokalemia    Iron deficiency anemia due to chronic blood loss 03/31/2017   Iron malabsorption 03/31/2017   Urinary retention 01/24/2017   Hypertrophy of prostate with urinary retention 12/24/2016   Elevated troponin 11/14/2016   Recurrent pneumonia 10/31/2016   Sprain of left rotator cuff capsule    Hyperkalemia    Paroxysmal SVT (supraventricular tachycardia) ([place]) 04/20/2016   Type II diabetes mellitus ([place]) 04/20/2016   Gout 04/20/2016   B12 deficiency 04/20/2016  Neuropathy due to secondary diabetes ([place]) 04/20/2016   Hydronephrosis    Acute on  chronic respiratory failure with hypoxia ([place]) 04/08/2016   Thrombocytosis ([place]) 04/07/2016   T wave inversion in EKG 12/29/2015   PCP NOTES >>>>>>>>>>>>>>>>> 05/02/2015   Peripheral edema    Thoracic aortic aneurysm ([place]) 04/26/2014   CKD (chronic kidney disease) stage 3, GFR 30-59 ml/min (HCC) 04/26/2014   Generalized weakness 04/06/2014   Venous stasis dermatitis of both lower extremities    Morbid obesity due to excess calories ([place]) 03/29/2014   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  Will attempt to set a time for family meeting on Thursday.  Given patients decline on CRRT it doesn't seem that trach / peg / Ltach is an option.  Goals of Care and Additional Recommendations:  Limitations on Scope of Treatment: Full Scope Treatment  Code Status:  Full code  Prognosis:   Hours - Days pending family's decisions on 4/2   Discharge Planning:  Anticipated Hospital Death  Care plan was discussed with CCM MD, bedside RN  Thank you for allowing the Palliative Medicine Team to assist in the care of this patient.  Total time spent:  35 min.     Greater than 50%  of this time was spent counseling and coordinating care related to the above assessment and plan.  Florentina Jenny, PA-C Palliative Medicine  Please contact Palliative MedicineTeam phone at [phone] for questions and concerns between 7 am - 7 pm.   Please see AMION for individual provider pager numbers.

## 2018-08-17 NOTE — Progress Notes (Signed)
Subjective: Interval History: no change, some tachycardia.  CRRT clotted earlier,filter changed  Objective: Vital signs in last 24 hours: Temp:  [97 F (36.1 C)-100.4 F (38 C)] 100.4 F (38 C) (04/01 0700) Pulse Rate:  [78-123] 111 (04/01 0700) Resp:  [16-26] 22 (04/01 0700) BP: (87-125)/(55-95) 87/55 (04/01 0700) SpO2:  [95 %-100 %] 98 % (04/01 0700) FiO2 (%):  [40 %] 40 % (04/01 0403) Weight:  [106.4 kg] 106.4 kg (04/01 0500) Weight change: -2.3 kg  Intake/Output from previous day: 03/31 0701 - 04/01 0700 In: 2444.3 [I.V.:904.3; NG/GT:1340; IV Piggyback:200] Out: 4198 [Urine:400; Stool:325] Intake/Output this shift: No intake/output data recorded.  General appearance: on vent, nonresponsive Neck: IJ cath Resp: rhonchi bilaterally Cardio: S1, S2 normal GI: pos bs, obese Extremities: edema 2-3+  Lab Results: Recent Labs    08/16/18 0500 08/17/18 0305  WBC 32.1* 37.6*  HGB 7.3* 7.8*  HCT 24.0* 23.7*  PLT 144* 147*   BMET:  Recent Labs    08/16/18 1600 08/17/18 0305  NA 133* 133*  K 3.9 4.0  CL 102 100  CO2 19* 21*  GLUCOSE 129* 116*  BUN >100* 72*  CREATININE 2.13* 1.61*  CALCIUM 7.7* 8.0*   No results for input(s): PTH in the last 72 hours. Iron Studies: No results for input(s): IRON, TIBC, TRANSFERRIN, FERRITIN in the last 72 hours.  Studies/Results: Dg Chest Port 1 View  Result Date: 08/15/2018 CLINICAL DATA:  Central line placement. EXAM: PORTABLE CHEST 1 VIEW COMPARISON:  08/15/2018 at 6:33 a.m. FINDINGS: New left internal jugular central venous line has its tip projecting in the mid superior vena cava. No pneumothorax. No change in lung aeration.  No new lung abnormalities. Endotracheal tube and nasal/orogastric tube are stable and well positioned. IMPRESSION: 1. Left internal jugular central venous line tip projects in the mid superior vena cava. No pneumothorax. 2. No other change from the earlier exam. Electronically Signed   By: Lajean Manes  M.D.   On: 08/15/2018 16:09    I have reviewed the patient's current medications.  Assessment/Plan: 1 AKI good solute, acid/base acceptable, K ok Periph  Vol xs but bp lower, keep even, Plan is for only 3d trial 2 enceph not uremic 3 Myelofibrosis 4 CAD 5 CHF 6 Pulm HTn 7 Resp failure per CCM P CRRT , keep even with bp lower and tachy.  3 d trial ends today.  Vent per CCM    LOS: 24 days   Jeneen Rinks Karie Skowron 08/17/2018,7:31 AM

## 2018-08-17 NOTE — Progress Notes (Signed)
NAME:  Yigit Norkus, MRN:  [id number], DOB:  12-May-1941, LOS: 24 ADMISSION DATE:  08/09/2018, CONSULTATION DATE:  3/13 REFERRING MD:  Lupita Leash, CHIEF COMPLAINT:  Acute encephalopathy and worsening mental status    Brief History   78 year old male patient admitted initially on 3/7 with sepsis secondary secondary to pseudomonas urinary tract infection with associated acute on chronic renal failure secondary to obstructive uropathy.  Over the course of his hospitalization developed progressive encephalopathy, with concern for nonconvulsive status on 3/12.  This was felt possibly secondary to neurotoxicity from cefepime.  Intubated for airway protection. He was initially under propofol, and transition to Versed due to severe bradycardia on 3/16, Versed was stopped 3/17.  Past Medical History  Chronic urinary retention with prostate hypertrophy, prior Pseudomonas urinary tract infections, stage III chronic kidney disease, moderate to severe pulmonary hypertension, chronic diastolic heart failure, myelodysplasia with myelodysplastic syndrome, chronic back pain  Significant Hospital Events   3/7: Admitted with presumptive diagnosis of urinary tract infection 3/9: worsening renal failure, seen by urology, Foley catheter placed 3/12: Worsening mental status, EEG worrisome for possible nonconvulsive status.  Felt possibly due to neurotoxicity from cefepime this was discontinued and Zosyn was started.  lumbar puncture performed, for possible meningitis.  Infectious disease consulted, neurology consulted, started on Keppra, moved to [facility] for continuous EEG monitoring 3/13 remains unresponsive, developed worsening hypoxia staph from prior LP unremarkable without evidence of meningitis.  Pulmonary asked to evaluate 3/17 Episode of bradycardia overnight in spite of stopping propofol hence dopamine started. Episode of mucus plugging 3/17 versed stopped 3/24>> No improvement in neuro status despite off sedation s 3/26 >>  overnight with low hgb s/p transfusion, Afib w/RVR, hypotensive requiring pressors, oliguria 3/28 -A Fib RVR -Amio bolus x 1 , weaning pressors  4/1 - CRRT trial to end today   Consults:  ID Neurology Urology PCCM Nephrology  PMT   Procedures:  3/12 Fluoro-guided LP performed at L3-L4. Opening pressure 12 cm H20. 13.5 ml clear CSF obtained 3/13 ETT >> 3/17 R DL PICC >> 3/31 L IJ trialysis >>  Significant Diagnostic Tests:  CT abdomen pelvis 3/7 showed bilateral hydronephrosis with bladder wall thickening and multiple bladder diverticuli suggesting chronic bladder outlet obstruction with cystitis  Renal US 3/10  resolved bilateral hydronephrosis   MRI brain 3/12: Truncated and motion degraded examination without visible acute abnormality.  MRI L spine 3/12:1. Mild foraminal narrowing bilaterally at L4-5 is worse on the left.2. Chronic endplate changes at E3-3 with associated Schmorl's node. 3. Minimal rightward disc bulging without significant stenosis.4. 10 mm solid lesion at the upper pole of the right kidney was not well seen on recent CT scans. Small renal cell carcinoma is not excluded. Recommend dedicated MRI of the kidneys as the patient's condition allows. 5. Stable cyst at the lower pole of the right kidney.  EEG 3/12 frequent periodic discharges concerning for triphasic versus epileptiform GPD's 3/17 EEG >>burst suppression, triphasics GPD's US abdomen 3/12: 1. No acute findings. 2. Single visualized gallstone. No evidence of acute cholecystitis. No other abnormality.  MRI brain 3/21 neg  CT Head 3/24 No acute infarct, no hemorrhage no edema. Small lipoma between lateral ventricles, mild periventricular small vessel disease  CXR 3/25 Moderate pulmonary vascular congestion, low lung volumes.   CXR 3/27 stable ETT/ gastric; bilateral LL atelectasis, multiple lucent areas in the bony structures raise the concern for possible degree of underlying multiple myeloma    Micro Data:  UC 3/7 pseudomonas (cipro resistant) Urine  cx 3/12 >> pseudomonas BCx2 3/8>>> neg BCX2 3/12>>>neg CSF 3/12: neg resp culture 3/13>>>nml flora 3/16 urine >> pseudomonas resp 3/18 >> providencia, multidrug-resistant (S ceftriaxone) 3/25 urine cultures negative 3/25 trach asp >> normal flora 3/25 BC x 2 >> ngtd >> 3/29 Trach asp>> few providencia stuartii   Antimicrobials:  Cefepime 3/7>>3/12 Zosyn 3/12>>> 3/19 zyvox 3/13>>>3/15 ceftx 3/21 (HCAP ) >> 3/27 Zosyn 3/29 >>3/31 Meropenum 3/31 >>  Interim history/subjective:  Hypotensive on CRRT Requiring repeat bolus of amiodarone for A.Fib   Objective   Blood pressure (!) 91/55, pulse (!) 121, temperature 97.9 F (36.6 C), resp. rate (!) 24, height '6\' 3"'$  (1.905 m), weight 106.4 kg, SpO2 97 %.    Vent Mode: PRVC FiO2 (%):  [40 %] 40 % Set Rate:  [16 bmp] 16 bmp Vt Set:  [670 mL] 670 mL PEEP:  [5 cmH20] 5 cmH20 Pressure Support:  [8 cmH20] 8 cmH20 Plateau Pressure:  [12 cmH20-15 cmH20] 15 cmH20   Intake/Output Summary (Last 24 hours) at 08/17/2018 1059 Last data filed at 08/17/2018 1000 Gross per 24 hour  Intake 2407.3 ml  Output 3627 ml  Net -1219.7 ml   Filed Weights   08/15/18 0449 08/16/18 0500 08/17/18 0500  Weight: 108.3 kg 108.7 kg 106.4 kg   Examination:  General:  Critically ill appearing older adult M, intubated, on CRRT, NAD  HEENT: NCAT, ETT OFT secure. Pink mmm. Trachea midline  Neuro: PERRL 82m. Does not respond to noxious stimuli.  CV: IRIR, no r/g/m. 1+ radial pulses. Capillary refill < 3 seconds BUE  PULM: Symmetrical chest wall expansion, diminished bibasilar breath sounds. No accessory muscle recruitment  GI: Soft, round, ndnt, normoactive x4  Extremities: 3+ edema. No obvious joint deformity  Skin: 9cm deep tissue sacral injury. Clean, warm, dry.   Resolved Hospital Problem list   Transient bradycardia-due to propofol, resolved Hyperchloremia and Non-anion gap metabolic acidosis,  resolved Hypertension  Assessment & Plan:   Acute Toxic /Metabolic Encephalopathy  Presumed nonconvulsive status +/- cefepime neurotoxicity-resolved on EEG  S/p 8 hours of burst suppression on 3/16, all sedation off since 3/17, has since remained comatose EEG 3/27 abnormal EEG with general background slowing, no evidence of seizures, indicative of severe encephalopathy. Plan Appreciate neurology's input Continue current AED per Neurology- keppra/ depakote/ vimpat Continue neuro exams  Prn ativan for seizure  Continue sedation hold  Appearing to be poor prognosis  Possible component of uremia-- plan is for trial of CRRT to see if any improvement in mental status, appreciate nephrology    Acute hypoxic respiratory failure Providencia HCAP Underlying severe PH and OSA - intermittent episodes of mucous plugging reported- resolved Plan Full MV support Daily SBT, mental status is a barrier to extubation Intubated since 3/13 Need to discuss +/- trach with family if not moving to comfort care after CRRT trial   Hypotension - likely related to rapid Afib/ RVR, slow progression of anemia and third spacing P:  Transfuse for Hgb <7 Re-bolus Amio for A. Fib NE for MAP goal > 639 Appears very third spaced, may benefit from slow gtt of albumin   New Onset Afib- now SR post transfusion - possibly related to anemia and fever  -possible element of fluid overload leading to atrial stretch  P:  Tele monitoring Repeat Amio bolus, amio gtt Fluid removal per CRRT   Acute on chronic renal failure (st III CKD) 2/2 chronic urinary retention w/ h/o poorly functioning bladder, enlarged prostate and mild to mod obstruction.  Has had multiple recurrent UTIs. Has not completed prior treatments in past d/t financial restraints.  Worsening uremia/ AKI- Solid lesion on upper pole of right kidney.  Hypokalemia-resolved  Hypernatremia-resolved   Lab Results  Component Value Date   CREATININE 1.61 (H)  08/17/2018   CREATININE 2.13 (H) 08/16/2018   CREATININE 2.55 (H) 08/16/2018   CREATININE 1.41 (H) 06/10/2018   CREATININE 1.48 (H) 04/08/2018   CREATININE 1.50 (H) 03/02/2018   CREATININE 1.3 (H) 03/30/2017   CREATININE 1.3 (H) 07/30/2014   CREATININE 1.2 11/16/2013    Recent Labs  Lab 08/16/18 0500 08/16/18 1600 08/17/18 0305  K 3.7 3.9 4.0   Recent Labs  Lab 08/16/18 0500 08/16/18 1600 08/17/18 0305  NA 134* 133* 133*   Plan Nephrology following, appreciate assistance  -3 day trial of dialysis to address any possible component of uremia contributing to mental status -Is not improving with CRRT -Is not tolerating CRRT-- A.Fib RVR with associated hypotension   Providencia HCAP with new infiltrate Complicated Pseudomonas Urinary tract infection in setting of obstructive uropathy-chronic Foley catheter, colonizer per ID, treated with 10 days of antibiotics total Ongoing severe leukocytosis- improving  Ongoing fever Plan Continue meropenem   Trend WBC/ fever curve  Fever has since resolved since CRRT- ?componet of drug fever AM CXR   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   CT abdomen imaging also noting innumerable lytic lesions involving the spine Leukocytosis- baseline 22k range Plan trending CBC  Iron def anemia Plan Transfuse for Hgb < 7 Observe for bleeding  Deep Tissue Sacral Injury P -WOCN consult  Global:  Today is last day of CRRT trial. Mental status is not improving and patient is not tolerating CRRT well as evidenced by cardiovascular problems (a Fib RVR, hypotension) PMT involved, saw yesterday: Family will decide Thursday 4/2 if wishing to pursue Trach/PEG/ long term support vs. Comfort care.   Best practice:  Diet: NPO, TF Pain/Anxiety/Delirium protocol (if indicated): Off  all sedation since 3/17 VAP protocol (if indicated): 3/13 DVT prophylaxis: SCDs GI prophylaxis: H2B Glucose control: ssi Mobilitity BR Code Status: full code  Family Communication:  Will update daughter by phone.  Disposition:  ICU    CCT 35 minutes   Eliseo Gum MSN, AGACNP-BC [place] [phone] If no answer, [phone] 08/17/2018, 11:00 AM

## 2018-08-17 NOTE — Progress Notes (Signed)
Patient heart rate increasing to 140s but not sustaining. ELink notified. No new orders at this time.

## 2018-08-17 NOTE — Consult Note (Signed)
[place] Nurse wound consult note Patient receiving care in [facility] 2H21.  Assisted with turning to view wounds by primary RN, and RN Altha Harm. 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   Wound bed: The right ear is purple and pink, the sacrum is dark purple with several small, scattered areas of skin peeling with pink wound bases (approximately 5 % pink, remainder is dark purple).  The right upper back lateral to the scapula has a 3 cm x 3 cm purple area with small areas where the overlying skin is missing.  These wound beds are pink, everything else with this wound is dark purple. Drainage (amount, consistency, odor) No odor, no induration Periwound: intact periwound areas Dressing procedure/placement/frequency: Foam dressings to all areas; change every 3 days and prn.  All areas had foam dressings to them at the time of my visit.  It is not uncommon for DTPIs to progress to unstageable PIs.  If this occurs, a change in topical therapy may be indicated. Monitor the wound area(s) for worsening of condition such as: Signs/symptoms of infection,  Increase in size,  Development of or worsening of odor, Development of pain, or increased pain at the affected locations.  Notify the medical team if any of these develop.  Thank you for the consult.   Val Riles, RN, MSN, CWOCN, CNS-BC, pager [phone]

## 2018-08-17 NOTE — Progress Notes (Signed)
CPT held at this time due to HR 120s. NP aware.

## 2018-08-17 DEATH — deceased

## 2018-08-18 ENCOUNTER — Inpatient Hospital Stay (HOSPITAL_COMMUNITY): Payer: Medicare Other

## 2018-08-18 DIAGNOSIS — N170 Acute kidney failure with tubular necrosis: Secondary | ICD-10-CM

## 2018-08-18 DIAGNOSIS — N184 Chronic kidney disease, stage 4 (severe): Secondary | ICD-10-CM

## 2018-08-18 DIAGNOSIS — R680 Hypothermia, not associated with low environmental temperature: Secondary | ICD-10-CM

## 2018-08-18 DIAGNOSIS — A419 Sepsis, unspecified organism: Secondary | ICD-10-CM

## 2018-08-18 LAB — BASIC METABOLIC PANEL
Anion gap: 9 (ref 5–15)
BUN: 38 mg/dL — ABNORMAL HIGH (ref 8–23)
CO2: 25 mmol/L (ref 22–32)
Calcium: 7.7 mg/dL — ABNORMAL LOW (ref 8.9–10.3)
Chloride: 102 mmol/L (ref 98–111)
Creatinine, Ser: 1.02 mg/dL (ref 0.61–1.24)
GFR calc Af Amer: >60 mL/min (ref >60)
GFR calc non Af Amer: >60 mL/min (ref >60)
Glucose, Bld: 237 mg/dL — ABNORMAL HIGH (ref 70–99)
Potassium: 3.5 mmol/L (ref 3.5–5.1)
Sodium: 136 mmol/L (ref 135–145)

## 2018-08-18 LAB — RENAL FUNCTION PANEL
Albumin: 1 g/dL — ABNORMAL LOW (ref 3.5–5.0)
Anion gap: 9 (ref 5–15)
BUN: 39 mg/dL — ABNORMAL HIGH (ref 8–23)
CO2: 24 mmol/L (ref 22–32)
Calcium: 7.8 mg/dL — ABNORMAL LOW (ref 8.9–10.3)
Chloride: 102 mmol/L (ref 98–111)
Creatinine, Ser: 0.94 mg/dL (ref 0.61–1.24)
GFR calc Af Amer: >60 mL/min (ref >60)
GFR calc non Af Amer: >60 mL/min (ref >60)
Glucose, Bld: 243 mg/dL — ABNORMAL HIGH (ref 70–99)
Phosphorus: 1.7 mg/dL — ABNORMAL LOW (ref 2.5–4.6)
Potassium: 3.5 mmol/L (ref 3.5–5.1)
Sodium: 135 mmol/L (ref 135–145)

## 2018-08-18 LAB — GLUCOSE, CAPILLARY
Glucose-Capillary: 159 mg/dL — ABNORMAL HIGH (ref 70–99)
Glucose-Capillary: 164 mg/dL — ABNORMAL HIGH (ref 70–99)
Glucose-Capillary: 124 mg/dL — ABNORMAL HIGH (ref 70–99)
Glucose-Capillary: 416 mg/dL — ABNORMAL HIGH (ref 70–99)
Glucose-Capillary: 114 mg/dL — ABNORMAL HIGH (ref 70–99)
Glucose-Capillary: 116 mg/dL — ABNORMAL HIGH (ref 70–99)

## 2018-08-18 LAB — POCT ACTIVATED CLOTTING TIME
Activated Clotting Time: 175 seconds
Activated Clotting Time: 180 seconds
Activated Clotting Time: 186 seconds
Activated Clotting Time: 180 seconds
Activated Clotting Time: 180 seconds
Activated Clotting Time: 186 seconds
Activated Clotting Time: 191 seconds
Activated Clotting Time: 180 seconds
Activated Clotting Time: 186 seconds
Activated Clotting Time: 169 seconds
Activated Clotting Time: 169 seconds
Activated Clotting Time: 169 seconds
Activated Clotting Time: 175 seconds

## 2018-08-18 LAB — CBC
HCT: 22.2 % — ABNORMAL LOW (ref 39.0–52.0)
Hemoglobin: 7 g/dL — ABNORMAL LOW (ref 13.0–17.0)
MCH: 25 pg — ABNORMAL LOW (ref 26.0–34.0)
MCHC: 31.5 g/dL (ref 30.0–36.0)
MCV: 79.3 fL — ABNORMAL LOW (ref 80.0–100.0)
Platelets: 135 10*3/uL — ABNORMAL LOW (ref 150–400)
RBC: 2.8 MIL/uL — ABNORMAL LOW (ref 4.22–5.81)
RDW: 19.9 % — ABNORMAL HIGH (ref 11.5–15.5)
WBC: 45.6 10*3/uL — ABNORMAL HIGH (ref 4.0–10.5)
nRBC: 1 % — ABNORMAL HIGH (ref 0.0–0.2)

## 2018-08-18 LAB — APTT: aPTT: 69 seconds — ABNORMAL HIGH (ref 24–36)

## 2018-08-18 LAB — VALPROIC ACID LEVEL
Valproic Acid Lvl: 114 ug/mL — ABNORMAL HIGH (ref 50.0–100.0)
Valproic Acid Lvl: <10 ug/mL — ABNORMAL LOW (ref 50.0–100.0)

## 2018-08-18 LAB — MAGNESIUM: Magnesium: 2.3 mg/dL (ref 1.7–2.4)

## 2018-08-18 MED ORDER — SODIUM PHOSPHATES 45 MMOLE/15ML IV SOLN
30.0000 mmol | Freq: Once | INTRAVENOUS | Status: AC
  Administered 2018-08-18: 08:00:00 30 mmol via INTRAVENOUS
  Filled 2018-08-18: qty 10

## 2018-08-18 MED ORDER — SODIUM CHLORIDE 0.9 % IV SOLN
500.0000 mg | INTRAVENOUS | Status: DC
  Administered 2018-08-18 – 2018-08-19 (×2): 500 mg via INTRAVENOUS
  Filled 2018-08-18 (×3): qty 0.5

## 2018-08-18 MED ORDER — POLYVINYL ALCOHOL 1.4 % OP SOLN
1.0000 [drp] | Freq: Four times a day (QID) | OPHTHALMIC | Status: DC
  Administered 2018-08-18 – 2018-08-20 (×9): 1 [drp] via OPHTHALMIC
  Filled 2018-08-18: qty 15

## 2018-08-18 MED ORDER — VALPROIC ACID 250 MG/5ML PO SOLN
1000.0000 mg | Freq: Every day | ORAL | Status: DC
  Administered 2018-08-18 – 2018-08-19 (×2): 1000 mg
  Filled 2018-08-18 (×3): qty 20

## 2018-08-18 MED ORDER — VALPROIC ACID 250 MG/5ML PO SOLN: 750.0000 mg | Freq: Every day | ORAL | Status: DC

## 2018-08-18 MED ORDER — VALPROATE SODIUM 500 MG/5ML IV SOLN
1000.0000 mg | Freq: Once | INTRAVENOUS | Status: AC
  Administered 2018-08-18: 1000 mg via INTRAVENOUS
  Filled 2018-08-18: qty 10

## 2018-08-18 NOTE — Progress Notes (Signed)
4/2. Video assisted phone call with family. All equipment and vital signs explained by RN. Family appreciative of assistance. Phone call lasted 15 minutes.                               Jihan Rudy,RN,BSN,CCRN

## 2018-08-18 NOTE — Progress Notes (Addendum)
Daily Progress Note   Patient Name: Juan [name]       Date: 08/18/2018 DOB: 1941-03-21  Age: 78 y.o. MRN#: [id number] Attending Physician: Chesley Mires, MD Primary Care Physician: Colon Branch, MD Admit Date: 08/10/2018  Reason for Consultation/Follow-up: Establishing goals of care, Psychosocial/spiritual support and Terminal Care  Subjective: Spoke with the CCM team and Neurology briefly.  Arranged a conference call with Juan [name] and Juan [name] for 11:45 today.  Patient remains non-responsive.   He is not tolerating CRRT -   having episodes of afib with RVR necessitating an amio drip,   unable to maintain his body temperature (remains on bair hugger),   hypotensive requiring a pressor gtt.    CXR shows worsening bibasilar and perihilar opacities.    Albumin is 1.0  He is unable to maintain fluid in his vessels due to hypoalbuminemia.    CRRT is unable to pull the fluid due to hypotension.  11:45 pm  Juan [name] and conferenced in Juan [name] and Juan [name].   Juan [name] is the child that Juan [name] has lived with for the past two years.  I provided them with an update - similar to the above information.   Juan [name] took issue with me saying he is unresponsive.  She "Elinks" in each evening and states that her father opens his eyes and responds to her voice and his grand daughter's voice.  She then expressed that taking away artificial support is going to cause him suffering and that he will take days to die after it is removed.  Afterward she quickly deferred decision making to Juan [name] and Juan [name] and hung up to go back to work.  Juan [name] expressed that he needed to pray about it further, but that he understood and felt that shifting towards a comfort approach was the appropriate thing to do.  Juan [name] agreed  with Juan [name].  She ask questions about how to relieve symptoms when the artificial support is removed.  She asked that the 4 children be allowed to be at bedside when he is taken off support.  The 4 children can be here Saturday mid to late afternoon.   Assessment: Patient is dying.  He is unable to undergo procedures (trach / PEG) that would allow him to be transferred to Juan [name].       Patient Profile/HPI:  78  y.o. male  with past medical history of recurrent MDR urinary tract infections causing frequent hospitalizations (8 in the past year), CKD 4, MDS with chronically elevated WBC, chronic respiratory failure with pulmonary HTN, AAA, DHF, and innumerable lytic lesions on imaging who was admitted on 07/29/2018 with dysuria and back pain.  Work up revealed complicated UTI, bilateral hydronephrosis with bladder outlet obstruction.  During his hospitalization he developed worsening mental status and was felt to be in non-convulsive status.  He required intubation on 3/13.  At the time of this consult 3/31 he remains intubated, he is non-responsive, and on CRRT.  The plan is for him to receive a 3 day trial of CRRT.    Length of Stay: 25  Current Medications: Scheduled Meds:  . sodium chloride   Intravenous Once  . chlorhexidine gluconate (MEDLINE KIT)  15 mL Mouth Rinse BID  . Chlorhexidine Gluconate Cloth  6 each Topical Daily  . clotrimazole  1 application Topical BID  . famotidine  20 mg Per Tube Daily  . feeding supplement (PRO-STAT SUGAR FREE 64)  60 mL Per Tube BID  . hydrocortisone cream  1 application Topical BID  . lacosamide  200 mg Per Tube BID  . levETIRAcetam  500 mg Per Tube BID  . mouth rinse  15 mL Mouth Rinse 10 times per day  . valproic acid  750 mg Per Tube q morning - 10a  . valproic acid  750 mg Per Tube QHS    Continuous Infusions: .  prismasol BGK 4/2.5 700 mL/hr at 08/18/18 0543  .  prismasol BGK 4/2.5 700 mL/hr at 08/18/18 0521  . sodium chloride 10 mL/hr at  08/18/18 0700  . amiodarone 30 mg/hr (08/18/18 0700)  . feeding supplement (VITAL 1.5 CAL) 1,000 mL (08/18/18 0416)  . heparin 10,000 units/ 20 mL infusion syringe 2,300 Units/hr (08/18/18 0921)  . meropenem (MERREM) IV Stopped (08/17/18 2216)  . norepinephrine (LEVOPHED) Adult infusion 4 mcg/min (08/18/18 0700)  . prismasol BGK 4/2.5 2,000 mL/hr at 08/18/18 0931  . sodium phosphate  Dextrose 5% IVPB 30 mmol (08/18/18 0751)    PRN Meds: acetaminophen (TYLENOL) oral liquid 160 mg/5 mL, albuterol, heparin, heparin, LORazepam  Physical Exam        Well developed chronically ill gentleman. Does not respond to my voice or exam.  Under bair hugger. Eye open but not seeing. CV rrr resp rhonchi heard bilaterally.  No distress on vent Abdomen slightly distended, nt Extremities with 3+ edema  Vital Signs: BP (!) 108/50   Pulse 97   Temp 98.6 F (37 C)   Resp (!) 26   Ht '6\' 3"'$  (1.905 m)   Wt 103.8 kg   SpO2 98%   BMI 28.60 kg/m  SpO2: SpO2: 98 % O2 Device: O2 Device: Ventilator O2 Flow Rate: O2 Flow Rate (L/min): 10 L/min  Intake/output summary:   Intake/Output Summary (Last 24 hours) at 08/18/2018 0954 Last data filed at 08/18/2018 0900 Gross per 24 hour  Intake 2376.5 ml  Output 2942 ml  Net -565.5 ml   LBM: Last BM Date: 08/17/18 Baseline Weight: Weight: 92.5 kg Most recent weight: Weight: 103.8 kg       Palliative Assessment/Data: 10%      Patient Active Problem List   Diagnosis Date Noted  . Bilateral hydronephrosis   . Paroxysmal atrial fibrillation with rapid ventricular response (Juan [name])   . Palliative care encounter   . Acute pulmonary edema (Juan [name])   . Pressure injury of  skin 08/05/2018  . Acute respiratory failure with hypoxemia (Juan [name])   . Aspiration pneumonia (Juan [name])   . AKI (acute kidney injury) (Juan [name])   . Complicated UTI (urinary tract infection) 07/24/2018  . Back pain 07/24/2018  . Normocytic anemia 06/10/2018  . Thrombocytopenia (Juan [name]) 06/10/2018  .  Recurrent UTI 06/10/2018  . Venous insufficiency of lower extremity 06/10/2018  . Goals of care, counseling/discussion 03/17/2018  . ARF (acute renal failure) (Juan [name]) 09/24/2017  . Staphylococcus epidermidis infection   . Encephalopathy acute 08/18/2017  . Sleep apnea, obstructive 08/18/2017  . 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 Moderate to severe pulmonary hypertension ([place]) 08/18/2017  . Chronic respiratory failure with hypoxia ([place]) 08/18/2017  . Chronic diastolic CHF (congestive heart failure), NYHA class 2 ([place]) 08/18/2017  . Myelodysplasia (myelodysplastic syndrome) ([place]) 08/18/2017  . HTN (hypertension) 08/18/2017  . Pyelonephritis 07/17/2017  . Hypokalemia   . Iron deficiency anemia due to chronic blood loss 03/31/2017  . 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 Urinary retention 01/24/2017  . 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 Elevated troponin 11/14/2016  . Recurrent pneumonia 10/31/2016  . Sprain of left rotator cuff capsule   . Hyperkalemia   . Paroxysmal SVT (supraventricular tachycardia) ([place]) 04/20/2016  . Type II diabetes mellitus ([place]) 04/20/2016  . Gout 04/20/2016  . B12 deficiency 04/20/2016  . Neuropathy due to secondary diabetes ([place]) 04/20/2016  . Hydronephrosis   . Acute on chronic respiratory failure with hypoxia ([place]) 04/08/2016  . Thrombocytosis ([place]) 04/07/2016  . T wave inversion in EKG 12/29/2015  . PCP NOTES >>>>>>>>>>>>>>>>> 05/02/2015  . Peripheral edema   . Thoracic aortic aneurysm ([place]) 04/26/2014  . CKD (chronic kidney disease) stage 3, GFR 30-59 ml/min (Juan [name]) 04/26/2014  . Generalized weakness 04/06/2014  . Venous stasis dermatitis of both lower extremities   . Morbid obesity due to excess calories ([place]) 03/29/2014  . 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 Leukocytosis 10/17/2013  . Poor compliance with advise  05/05/2012  . Annual physical exam 01/14/2012  . Weight loss 01/14/2012  . BPH (benign prostatic  hyperplasia) 01/14/2012  . 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 Obstructive sleep apnea 09/07/2006  . Coronary atherosclerosis 09/07/2006  . Allergic rhinitis 09/07/2006    Palliative Care Plan    Recommendations/Plan:  Patient's body is dying.  Family still not in complete agreement but decision making was deferred to Juan [name] and Juan [name] who would like to remove artificial life support when the 4 children can be present on Saturday (4/4) mid to late afternoon.    Juan [name] agreed to DNR (Thanks to [employer] for reminding me to confirm DNR)  4 children to be at bedside are all on the face sheet Spurgin,Carolyn Daughter   [phone]  Deitrich, Steve   [phone]  [name] Daughter   [phone]  Samuel Jester   [phone]    PMT will follow up Sat am to place orders for comfort medications.  Discontinue artificial feedings at this point in order to help reduce excess secretions on Saturday  Discontinue lab draws, frequent vital signs, No further escalation of care.  Greatly appreciate the excellent nursing care.  Goals of Care and Additional Recommendations:  Limitations on Scope of Treatment: No further escalation of care but continue current treatments until withdraw on Saturday.  Code Status:  DNR  Prognosis:   Hours - Days   Discharge Planning:  Anticipated [name] Death  Care plan was discussed with CCM, Neurology, family.  Thank you for  allowing the Palliative Medicine Team to assist in the care of this patient.  Total time spent:  60 min.     Greater than 50%  of this time was spent counseling and coordinating care related to the above assessment and plan.  Florentina Jenny, PA-C Palliative Medicine  Please contact Palliative MedicineTeam phone at [phone] for questions and concerns between 7 am - 7 pm.   Please see AMION for individual provider pager numbers.

## 2018-08-18 NOTE — Progress Notes (Addendum)
Pharmacy Antibiotic Note  Juan [name] is a 78 y.o. male admitted on 08/09/2018 with sepsis 2/2 Pseudomonas UTI, then received treatment with Zosyn for providencia HAP. Pt started on Zosyn yesterday with worsening congestion on CXR and fevers. CRRT started yesterday. Providencia growing in sputum with sensitivities returned. As it appears to have failed therapy with rocephin previously will transition to meropenem. Pt now to transition off CVVHD, likely comfort soon. Will adjust meropenem dosing for now while awaiting palliative care consult.  Plan: -Meropenem 500mg  IV q24h  Height: 6\' 3"  (190.5 cm) Weight: 228 lb 13.4 oz (103.8 kg) IBW/kg (Calculated) : 84.5  Temp (24hrs), Avg:97.1 F (36.2 C), Min:96.4 F (35.8 C), Max:98.6 F (37 C)  Recent Labs  Lab 08/14/18 0347 08/15/18 0436  08/16/18 0500 08/16/18 1600 08/17/18 0305 08/17/18 1715 08/18/18 0354  WBC 49.1* 41.0*  --  32.1*  --  37.6*  --  45.6*  CREATININE 3.21* 3.37*   < > 2.55* 2.13* 1.61* 1.23 1.02  0.94   < > = values in this interval not displayed.    Estimated Creatinine Clearance: 87.2 mL/min (by C-G formula based on SCr of 0.94 mg/dL).    Allergies  Allergen Reactions  . Cefepime Other (See Comments)    Seizures   Antimicrobials this admission: CTX 3/7 x1 ED; 3/21 >> 3/27 Cefepime 3/8 >> 3/12 (D/C cefepime for seizures) Zosyn 3/13>>14; 3/16 >> 3/19; 3/30 >>3/31 Zyvox 3/13 >>3/16 Meropenem 3/31 >>  Microbiology results: 3/8 BCx: NG final 3/7 UCx: >100k Pseudomonas aeruginosa 3/8 MRSA PCR: negative 3/12 CSF cx: NG final 3/12 BCx: NG final 3/12 MRSA PCR: negative 3/12 UCx: Pseudomonas aeruginosa 3/13 TA Cx: normal flora 3/16 UCx: Pseudomonas aeurginosa 3/18 TA Cx: Providencia stuartii 3/20 C. diff: negative 3/25 TA cx: normal flora 3/25 UCx: NGF 3/25 BCx: NG x4 3/29 TA Cx: providencia  Thank you for allowing pharmacy to be a part of this patient's care.   Arrie Senate, PharmD, BCPS Clinical  Pharmacist [phone] Please check AMION for all [place] numbers 08/18/2018

## 2018-08-18 NOTE — Progress Notes (Addendum)
Assisted in tele visit  with the daughter today and she is very Patent attorney.

## 2018-08-18 NOTE — Progress Notes (Signed)
Valproic acid <10.  Results called to Dr. Reesa Chew.

## 2018-08-18 NOTE — Progress Notes (Signed)
Subjective: Interval History: pressor dependent, on vent , no meaningful interaction. Objective: Vital signs in last 24 hours: Temp:  [96.4 F (35.8 C)-99.3 F (37.4 C)] 97.3 F (36.3 C) (04/02 0700) Pulse Rate:  [79-144] 87 (04/02 0700) Resp:  [16-29] 18 (04/02 0700) BP: (75-138)/(41-92) 100/55 (04/02 0700) SpO2:  [87 %-100 %] 100 % (04/02 0700) FiO2 (%):  [40 %] 40 % (04/02 0400) Weight:  [103.8 kg] 103.8 kg (04/02 0500) Weight change: -2.6 kg  Intake/Output from previous day: 04/01 0701 - 04/02 0700 In: 2529.1 [I.V.:859; NG/GT:1470; IV Piggyback:200.1] Out: 2981 [Stool:550] Intake/Output this shift: No intake/output data recorded.  General appearance: nonresponsive, flutters eyes,  Neck: IJ cath Resp: rales bibasilar and rhonchi bibasilar Cardio: irregularly irregular rhythm and S1, S2 normal GI: pos bs, soft,  Extremities: edema 2-3+  Lab Results: Recent Labs    08/17/18 0305 08/18/18 0354  WBC 37.6* 45.6*  HGB 7.8* 7.0*  HCT 23.7* 22.2*  PLT 147* 135*   BMET:  Recent Labs    08/17/18 1715 08/18/18 0354  NA 135 136  135  K 3.7 3.5  3.5  CL 101 102  102  CO2 23 25  24   GLUCOSE 146* 237*  243*  BUN 50* 38*  39*  CREATININE 1.23 1.02  0.94  CALCIUM 8.0* 7.7*  7.8*   No results for input(s): PTH in the last 72 hours. Iron Studies: No results for input(s): IRON, TIBC, TRANSFERRIN, FERRITIN in the last 72 hours.  Studies/Results: No results found.  I have reviewed the patient's current medications.  Assessment/Plan: 1 AKI uremia not an issue, was azotemic.  Good solute, acid/base/k with CRRT. Vol xs but bp low , pressor dependent. 2 Myelodysplasia 3 VDRF per CCM 4 Pulm HTn,  5 SVT 6 TAA 7 Obesity 8 DM P Per plan 3d trial CRRT, will stop today.  Needs EOL counseling, Vent per CCM,     LOS: 25 days   Jeneen Rinks Svara Twyman 08/18/2018,7:07 AM

## 2018-08-18 NOTE — Progress Notes (Signed)
Subjective: No recent events.  Remains minimally responsive, with few spontaneous movements by blinking eyes and some grimacing. Off of all the sedating medications for about a week now.  Objective: Current vital signs: BP (!) 100/55   Pulse 87   Temp (!) 97.3 F (36.3 C)   Resp 18   Ht _0  (1.905 m)   Wt 103.8 kg   SpO2 100%   BMI 28.60 kg/m  Vital signs in last 24 hours: Temp:  [96.4 F (35.8 C)-98.4 F (36.9 C)] 97.3 F (36.3 C) (04/02 0700) Pulse Rate:  [79-144] 87 (04/02 0700) Resp:  [16-29] 18 (04/02 0700) BP: (75-138)/(41-92) 100/55 (04/02 0700) SpO2:  [87 %-100 %] 100 % (04/02 0700) FiO2 (%):  [40 %] 40 % (04/02 0747) Weight:  [103.8 kg] 103.8 kg (04/02 0500)  Intake/Output from previous day: 04/01 0701 - 04/02 0700 In: 2529.1 [I.V.:859; NG/GT:1470; IV Piggyback:200.1] Out: 2981 [Stool:550] Intake/Output this shift: No intake/output data recorded. Nutritional status:  Diet Order            Diet NPO time specified  Diet effective now              Neurologic Exam: Ment: Eyes closed with  Intermittent, nonpurposeful, spontaneous blinking of eyes. Not following any commands.  Has weak grimace with noxious stimuli to right lower extremity.  No other response to noxious stimuli applied to any other extremity. CN: Pupils: 3 mm on left, 4 mm on right and with sluggish response to light. Weak corneal reflexes bilaterally. Face flaccidly symmetric.  Motor/Sensory: Flaccid tone x 4 with no movement to any stimuli except a mild grimace with noxious stimuli to right lower extremity.  Reflexes: Hypoactive.   Lab Results: Results for orders placed or performed during the hospital encounter of 08/06/2018 (from the past 48 hour(s))  Glucose, capillary     Status: Abnormal   Collection Time: 08/16/18  8:49 AM  Result Value Ref Range   Glucose-Capillary 119 (H) 70 - 99 mg/dL  Glucose, capillary     Status: Abnormal   Collection Time: 08/16/18 12:22 PM  Result Value Ref  Range   Glucose-Capillary 102 (H) 70 - 99 mg/dL   Comment 1 Notify RN   Glucose, capillary     Status: Abnormal   Collection Time: 08/16/18  3:48 PM  Result Value Ref Range   Glucose-Capillary 105 (H) 70 - 99 mg/dL  Renal function panel (daily at 1600)     Status: Abnormal   Collection Time: 08/16/18  4:00 PM  Result Value Ref Range   Sodium 133 (L) 135 - 145 mmol/L   Potassium 3.9 3.5 - 5.1 mmol/L   Chloride 102 98 - 111 mmol/L   CO2 19 (L) 22 - 32 mmol/L   Glucose, Bld 129 (H) 70 - 99 mg/dL   BUN >100 (H) 8 - 23 mg/dL   Creatinine, Ser 2.13 (H) 0.61 - 1.24 mg/dL   Calcium 7.7 (L) 8.9 - 10.3 mg/dL   Phosphorus 3.4 2.5 - 4.6 mg/dL   Albumin 1.1 (L) 3.5 - 5.0 g/dL   GFR calc non Af Amer 29 (L) >60 mL/min   GFR calc Af Amer 34 (L) >60 mL/min   Anion gap 12 5 - 15    Comment: Performed at [facility], [address], [place], [place] [address]  Glucose, capillary     Status: Abnormal   Collection Time: 08/16/18  8:05 PM  Result Value Ref Range   Glucose-Capillary 106 (  H) 70 - 99 mg/dL  Glucose, capillary     Status: Abnormal   Collection Time: 08/16/18 11:53 PM  Result Value Ref Range   Glucose-Capillary 113 (H) 70 - 99 mg/dL  Valproic acid level     Status: Abnormal   Collection Time: 08/17/18  3:05 AM  Result Value Ref Range   Valproic Acid Lvl 15 (L) 50.0 - 100.0 ug/mL    Comment: Performed at [facility], [place] [address], [place], [place] [address]  Renal function panel (daily at 0500)     Status: Abnormal   Collection Time: 08/17/18  3:05 AM  Result Value Ref Range   Sodium 133 (L) 135 - 145 mmol/L   Potassium 4.0 3.5 - 5.1 mmol/L   Chloride 100 98 - 111 mmol/L   CO2 21 (L) 22 - 32 mmol/L   Glucose, Bld 116 (H) 70 - 99 mg/dL   BUN 72 (H) 8 - 23 mg/dL   Creatinine, Ser 1.61 (H) 0.61 - 1.24 mg/dL   Calcium 8.0 (L) 8.9 - 10.3 mg/dL   Phosphorus 2.5 2.5 - 4.6 mg/dL   Albumin 1.1 (L) 3.5 - 5.0 g/dL   GFR calc non Af Amer 41 (L) >60 mL/min   GFR calc Af  Amer 47 (L) >60 mL/min   Anion gap 12 5 - 15    Comment: Performed at [address], [place], [place] [address]  CBC     Status: Abnormal   Collection Time: 08/17/18  3:05 AM  Result Value Ref Range   WBC 37.6 (H) 4.0 - 10.5 K/uL   RBC 3.00 (L) 4.22 - 5.81 MIL/uL   Hemoglobin 7.8 (L) 13.0 - 17.0 g/dL   HCT 23.7 (L) 39.0 - 52.0 %   MCV 79.0 (L) 80.0 - 100.0 fL   MCH 26.0 26.0 - 34.0 pg   MCHC 32.9 30.0 - 36.0 g/dL   RDW 19.9 (H) 11.5 - 15.5 %   Platelets 147 (L) 150 - 400 K/uL    Comment: REPEATED TO VERIFY   nRBC 0.4 (H) 0.0 - 0.2 %    Comment: Performed at [facility], [place] [address], [place], [name] [address]  Magnesium     Status: None   Collection Time: 08/17/18  3:05 AM  Result Value Ref Range   Magnesium 2.2 1.7 - 2.4 mg/dL    Comment: Performed at [facility] [address] [place], [place] [address]  Glucose, capillary     Status: Abnormal   Collection Time: 08/17/18  4:20 AM  Result Value Ref Range   Glucose-Capillary 110 (H) 70 - 99 mg/dL  Glucose, capillary     Status: Abnormal   Collection Time: 08/17/18  7:58 AM  Result Value Ref Range   Glucose-Capillary 109 (H) 70 - 99 mg/dL  POCT Activated clotting time     Status: None   Collection Time: 08/17/18  8:00 AM  Result Value Ref Range   Activated Clotting Time 153 seconds  POCT Activated clotting time     Status: None   Collection Time: 08/17/18  9:02 AM  Result Value Ref Range   Activated Clotting Time 147 seconds  POCT Activated clotting time     Status: None   Collection Time: 08/17/18 10:04 AM  Result Value Ref Range   Activated Clotting Time 142 seconds  POCT Activated clotting time     Status: None   Collection Time: 08/17/18 11:05 AM  Result Value Ref Range   Activated Clotting Time 153 seconds  Glucose, capillary     Status: Abnormal   Collection Time: 08/17/18 11:57 AM  Result Value Ref Range   Glucose-Capillary 231 (H) 70 - 99 mg/dL  POCT Activated clotting  time     Status: None   Collection Time: 08/17/18 11:59 AM  Result Value Ref Range   Activated Clotting Time 142 seconds  POCT Activated clotting time     Status: None   Collection Time: 08/17/18 12:59 PM  Result Value Ref Range   Activated Clotting Time 120 seconds  POCT Activated clotting time     Status: None   Collection Time: 08/17/18  2:14 PM  Result Value Ref Range   Activated Clotting Time 158 seconds  POCT Activated clotting time     Status: None   Collection Time: 08/17/18  3:03 PM  Result Value Ref Range   Activated Clotting Time 153 seconds  Glucose, capillary     Status: Abnormal   Collection Time: 08/17/18  3:53 PM  Result Value Ref Range   Glucose-Capillary 156 (H) 70 - 99 mg/dL  POCT Activated clotting time     Status: None   Collection Time: 08/17/18  3:53 PM  Result Value Ref Range   Activated Clotting Time 158 seconds  POCT Activated clotting time     Status: None   Collection Time: 08/17/18  4:56 PM  Result Value Ref Range   Activated Clotting Time 147 seconds  Renal function panel (daily at 1600)     Status: Abnormal   Collection Time: 08/17/18  5:15 PM  Result Value Ref Range   Sodium 135 135 - 145 mmol/L   Potassium 3.7 3.5 - 5.1 mmol/L   Chloride 101 98 - 111 mmol/L   CO2 23 22 - 32 mmol/L   Glucose, Bld 146 (H) 70 - 99 mg/dL   BUN 50 (H) 8 - 23 mg/dL   Creatinine, Ser 1.23 0.61 - 1.24 mg/dL   Calcium 8.0 (L) 8.9 - 10.3 mg/dL   Phosphorus 2.2 (L) 2.5 - 4.6 mg/dL   Albumin 1.1 (L) 3.5 - 5.0 g/dL   GFR calc non Af Amer 57 (L) >60 mL/min   GFR calc Af Amer >60 >60 mL/min   Anion gap 11 5 - 15    Comment: Performed at [facility], [address] [place], [place] [address]  POCT Activated clotting time     Status: None   Collection Time: 08/17/18  6:06 PM  Result Value Ref Range   Activated Clotting Time 164 seconds  POCT Activated clotting time     Status: None   Collection Time: 08/17/18  7:19 PM  Result Value Ref Range   Activated  Clotting Time 158 seconds  Glucose, capillary     Status: Abnormal   Collection Time: 08/17/18  7:55 PM  Result Value Ref Range   Glucose-Capillary 172 (H) 70 - 99 mg/dL  POCT Activated clotting time     Status: None   Collection Time: 08/17/18  8:04 PM  Result Value Ref Range   Activated Clotting Time 186 seconds  POCT Activated clotting time     Status: None   Collection Time: 08/17/18  9:10 PM  Result Value Ref Range   Activated Clotting Time 191 seconds  POCT Activated clotting time     Status: None   Collection Time: 08/17/18 10:05 PM  Result Value Ref Range   Activated Clotting Time 186  seconds  POCT Activated clotting time     Status: None   Collection Time: 08/17/18 11:08 PM  Result Value Ref Range   Activated Clotting Time 175 seconds  Glucose, capillary     Status: Abnormal   Collection Time: 08/17/18 11:48 PM  Result Value Ref Range   Glucose-Capillary 159 (H) 70 - 99 mg/dL  POCT Activated clotting time     Status: None   Collection Time: 08/18/18 12:06 AM  Result Value Ref Range   Activated Clotting Time 180 seconds  POCT Activated clotting time     Status: None   Collection Time: 08/18/18 12:57 AM  Result Value Ref Range   Activated Clotting Time 186 seconds  POCT Activated clotting time     Status: None   Collection Time: 08/18/18  2:00 AM  Result Value Ref Range   Activated Clotting Time 180 seconds  POCT Activated clotting time     Status: None   Collection Time: 08/18/18  3:05 AM  Result Value Ref Range   Activated Clotting Time 180 seconds  Glucose, capillary     Status: Abnormal   Collection Time: 08/18/18  3:51 AM  Result Value Ref Range   Glucose-Capillary 164 (H) 70 - 99 mg/dL  Valproic acid level     Status: Abnormal   Collection Time: 08/18/18  3:54 AM  Result Value Ref Range   Valproic Acid Lvl 114 (H) 50.0 - 100.0 ug/mL    Comment: RESULTS CONFIRMED BY MANUAL DILUTION Performed at [facility], [address] [place], [place]  [address]   Renal function panel (daily at 0500)     Status: Abnormal   Collection Time: 08/18/18  3:54 AM  Result Value Ref Range   Sodium 135 135 - 145 mmol/L   Potassium 3.5 3.5 - 5.1 mmol/L   Chloride 102 98 - 111 mmol/L   CO2 24 22 - 32 mmol/L   Glucose, Bld 243 (H) 70 - 99 mg/dL   BUN 39 (H) 8 - 23 mg/dL   Creatinine, Ser 0.94 0.61 - 1.24 mg/dL   Calcium 7.8 (L) 8.9 - 10.3 mg/dL   Phosphorus 1.7 (L) 2.5 - 4.6 mg/dL   Albumin 1.0 (L) 3.5 - 5.0 g/dL   GFR calc non Af Amer >60 >60 mL/min   GFR calc Af Amer >60 >60 mL/min   Anion gap 9 5 - 15    Comment: Performed at [facility] [address], [place], [place] [address]  Magnesium     Status: None   Collection Time: 08/18/18  3:54 AM  Result Value Ref Range   Magnesium 2.3 1.7 - 2.4 mg/dL    Comment: Performed at [facility] [address] [place], [place] [address]  APTT     Status: Abnormal   Collection Time: 08/18/18  3:54 AM  Result Value Ref Range   aPTT 69 (H) 24 - 36 seconds    Comment:        IF BASELINE aPTT IS ELEVATED, SUGGEST PATIENT RISK ASSESSMENT BE USED TO DETERMINE APPROPRIATE ANTICOAGULANT THERAPY. Performed at [facility], [place] [address], [place] [address]   CBC     Status: Abnormal   Collection Time: 08/18/18  3:54 AM  Result Value Ref Range   WBC 45.6 (H) 4.0 - 10.5 K/uL   RBC 2.80 (L) 4.22 - 5.81 MIL/uL   Hemoglobin 7.0 (L) 13.0 - 17.0 g/dL   HCT 22.2 (L) 39.0 -  52.0 %   MCV 79.3 (L) 80.0 - 100.0 fL   MCH 25.0 (L) 26.0 - 34.0 pg   MCHC 31.5 30.0 - 36.0 g/dL   RDW 19.9 (H) 11.5 - 15.5 %   Platelets 135 (L) 150 - 400 K/uL    Comment: REPEATED TO VERIFY Immature Platelet Fraction may be clinically indicated, consider ordering this additional test [id number]    nRBC 1.0 (H) 0.0 - 0.2 %    Comment: Performed at [facility] [address] [place], [place] [address]  Basic metabolic panel     Status: Abnormal   Collection Time: 08/18/18  3:54 AM  Result  Value Ref Range   Sodium 136 135 - 145 mmol/L   Potassium 3.5 3.5 - 5.1 mmol/L   Chloride 102 98 - 111 mmol/L   CO2 25 22 - 32 mmol/L   Glucose, Bld 237 (H) 70 - 99 mg/dL   BUN 38 (H) 8 - 23 mg/dL   Creatinine, Ser 1.02 0.61 - 1.24 mg/dL   Calcium 7.7 (L) 8.9 - 10.3 mg/dL   GFR calc non Af Amer >60 >60 mL/min   GFR calc Af Amer >60 >60 mL/min   Anion gap 9 5 - 15    Comment: Performed at [facility], [place] [address], [place], [place] [address]  POCT Activated clotting time     Status: None   Collection Time: 08/18/18  4:06 AM  Result Value Ref Range   Activated Clotting Time 186 seconds  POCT Activated clotting time     Status: None   Collection Time: 08/18/18  5:05 AM  Result Value Ref Range   Activated Clotting Time 191 seconds  POCT Activated clotting time     Status: None   Collection Time: 08/18/18  6:03 AM  Result Value Ref Range   Activated Clotting Time 180 seconds  POCT Activated clotting time     Status: None   Collection Time: 08/18/18  6:59 AM  Result Value Ref Range   Activated Clotting Time 186 seconds    Recent Results (from the past 240 hour(s))  Culture, respiratory     Status: None   Collection Time: 08/10/18  3:57 AM  Result Value Ref Range Status   Specimen Description ENDOTRACHEAL  Final   Special Requests NONE  Final   Gram Stain   Final    ABUNDANT WBC PRESENT, PREDOMINANTLY PMN NO ORGANISMS SEEN    Culture   Final    RARE Consistent with normal respiratory flora. Performed at [facility], [place] [address] [place], [place] [address]    Report Status 08/12/2018 FINAL  Final  Culture, Urine     Status: None   Collection Time: 08/10/18  5:00 AM  Result Value Ref Range Status   Specimen Description URINE, CATHETERIZED  Final   Special Requests NONE  Final   Culture   Final    NO GROWTH Performed at [facility], [place] [address], [place], [place] [address]    Report Status 08/11/2018 FINAL  Final  Culture, blood (routine x 2)      Status: None   Collection Time: 08/10/18  5:16 AM  Result Value Ref Range Status   Specimen Description BLOOD LEFT FOREARM  Final   Special Requests   Final    BOTTLES DRAWN AEROBIC AND ANAEROBIC Blood Culture adequate volume   Culture   Final    NO GROWTH 5 DAYS Performed at [facility], [place] [address], [place], [place] [address]    Report Status 08/15/2018 FINAL  Final  Culture, blood (routine x 2)     Status: None   Collection Time: 08/10/18  5:26 AM  Result Value Ref Range Status   Specimen Description BLOOD LEFT HAND  Final   Special Requests   Final    BOTTLES DRAWN AEROBIC AND ANAEROBIC Blood Culture adequate volume   Culture   Final    NO GROWTH 5 DAYS Performed at [facility], [place] [address], [place], [place] [address]    Report Status 08/15/2018 FINAL  Final  Culture, respiratory (non-expectorated)     Status: None   Collection Time: 08/14/18  1:21 PM  Result Value Ref Range Status   Specimen Description TRACHEAL ASPIRATE  Final   Special Requests NONE  Final   Gram Stain   Final    NO SQUAMOUS EPITHELIAL CELLS PRESENT FEW WBC PRESENT,BOTH PMN AND MONONUCLEAR FEW GRAM NEGATIVE RODS RARE GRAM POSITIVE COCCI IN CLUSTERS Performed at [facility], [address] [place],  [address]    Culture FEW PROVIDENCIA STUARTII  Final   Report Status 08/16/2018 FINAL  Final   Organism ID, Bacteria PROVIDENCIA STUARTII  Final      Susceptibility   Providencia stuartii - MIC*    AMPICILLIN >=32 RESISTANT Resistant     CEFAZOLIN >=64 RESISTANT Resistant     CEFEPIME 32 RESISTANT Resistant     CEFTAZIDIME >=64 RESISTANT Resistant     CEFTRIAXONE 2 SENSITIVE Sensitive     CIPROFLOXACIN >=4 RESISTANT Resistant     GENTAMICIN RESISTANT Resistant     IMIPENEM 1 SENSITIVE Sensitive     TRIMETH/SULFA <=20 SENSITIVE Sensitive     AMPICILLIN/SULBACTAM >=32 RESISTANT Resistant     PIP/TAZO 32 INTERMEDIATE Intermediate     * FEW PROVIDENCIA STUARTII     Lipid Panel No results for input(s): CHOL, TRIG, HDL, CHOLHDL, VLDL, LDLCALC in the last 72 hours.  Studies/Results: Dg Chest Port 1 View  Result Date: 08/18/2018 CLINICAL DATA:  Respiratory failure EXAM: PORTABLE CHEST 1 VIEW COMPARISON:  08/15/2018 FINDINGS: Support devices are stable. Cardiomegaly with vascular congestion. Left perihilar and bibasilar airspace opacities have worsened since prior study. This could reflect edema or atelectasis. No visible effusions or acute bony abnormality. IMPRESSION: Left perihilar and bibasilar airspace opacities, slightly worsening, possibly edema or atelectasis. Electronically Signed   By: Rolm Baptise M.D.   On: 08/18/2018 07:54    Medications:  Scheduled: . sodium chloride   Intravenous Once  . chlorhexidine gluconate (MEDLINE KIT)  15 mL Mouth Rinse BID  . Chlorhexidine Gluconate Cloth  6 each Topical Daily  . clotrimazole  1 application Topical BID  . famotidine  20 mg Per Tube Daily  . feeding supplement (PRO-STAT SUGAR FREE 64)  60 mL Per Tube BID  . hydrocortisone cream  1 application Topical BID  . lacosamide  200 mg Per Tube BID  . levETIRAcetam  500 mg Per Tube BID  . mouth rinse  15 mL Mouth Rinse 10 times per day  . valproic acid  1,000 mg Per Tube QHS  . valproic acid  750 mg Per Tube q morning - 10a   Continuous: .  prismasol BGK 4/2.5 700 mL/hr at 08/18/18 0543  .  prismasol BGK 4/2.5 700 mL/hr at 08/18/18 0521  . sodium chloride 10 mL/hr at 08/18/18 0700  . amiodarone 30 mg/hr (08/18/18 0700)  . feeding supplement (  VITAL 1.5 CAL) 1,000 mL (08/18/18 0416)  . heparin 10,000 units/ 20 mL infusion syringe 2,150 Units/hr (08/18/18 0702)  . meropenem (MERREM) IV Stopped (08/17/18 2216)  . norepinephrine (LEVOPHED) Adult infusion 4 mcg/min (08/18/18 0700)  . prismasol BGK 4/2.5 2,000 mL/hr at 08/18/18 0654  . sodium phosphate  Dextrose 5% IVPB 30 mmol (08/18/18 0751)    Assessment:  78 year old male with a PMHx of  myeloproliferative disorder, CHF, coronary disease, sleep apnea and recurrent UTIs initially admitted for treatment of complicated UTI, developed encephalopathy. Initial presentation was most consistent with cefepime toxicity precipitating subclinical status epilepticus, which was treated with Versed and propofol drip. Spinal tap was negative for infection and also was not consistent with an inflammatory etiology. Initial EEG on 3/12 revealed an indeterminate pattern which could be seen with nonconvulsive status epilepticus or with metabolic/toxic encephalopathy.Subsequent EEG on 3/14 revealed rapid GPDs and diffuse background slowing with some myoclonus occurring in association with the GPDs, concerning for a nonconvulsive seizure pattern - the GPDs resolved with increased sedation, consistent with nonconvulsive seizure. Events of left sided neck jerking seen on subsequent exams showed no EEG correlate. EEG on 319 was most consistent with a severe diffuse encephalopathy, that was non-specific as to etiology with contribution from sedating medication effect not excludable; there was a degree of improvement compared to previous epoch due to the presence of partial arousals; EEG on 3/19 continued to be negative for epileptiform discharges, seizures, or focal/lateralizing signs. He has now been off of sedation for >14 days with no significant improvement on Neurological examination.  1. His renal function continues to improve with CRRT but they were unable to pull any fluid due to hypotension and frequent A. fib with RVR. 2.  No improvement in his encephalopathy.  It was initially thought to be due to cefepime toxicity which was discontinued, currently patient is on meropenem, has completed a course of Zosyn and ceftriaxone.  He is not on any sedation for many days now.  Palliative was consulted and awaiting family meeting. 3. EEG on 3/27: This is an abnormal electroencephalogram secondary to general background  slowing and GPDs of triphasic morphology at a frequency of one every 1-2 seconds.  There is no change in clinical activity during the recording.  4.  Depakote level high at 114 today. Decreasing night-time dose and obtaining repeat level now and tomorrow AM. Will also hold 10 AM dose today. 5. Myeloproliferative disorder. WBC today is 45.6, which is significantly increased relative to his baseline of 20's-30's, multiple lytic bone lesions concerning for multiple myeloma.  Impression: # Toxic metabolic encephalopathy, multifactorial-impaired renal function/uremia with numbers improving with CRRT but no clinical improvement. Also on DDx is prolonged sedation with Versed and propofol in the context of renal impairment (now discontinued for > 1 week), but felt to be unlikely due to length of time during which he has been off sedating meds. # Presumed status epilepticus--resolved # Presumed cefepime toxicity-also resolved given EEG findings  Recommendations: - Hold Depakote dose this morning. - Then continue Depakote at 750 mg twice daily. - Repeat levels tomorrow morning. - Continue Keppra   - Management of renal failure per nephrology. - Management of myeloproliferative disorder per primary team-would be beneficial to touch base with hematology. -Management of A. fib with RVR per cardiology, currently in sinus rhythm.  40 minutes spent in the neurological evaluation and management of this critically ill patient   LOS: 25 days    Lorella Nimrod PGY 3  Pager [phone]  I have seen and examined the patient. I have formulated the assessment and plan.  Electronically signed: Dr. Kerney Elbe

## 2018-08-18 NOTE — Progress Notes (Signed)
NAME:  Juan [name], MRN:  [id number], DOB:  05-15-1941, LOS: 25 ADMISSION DATE:  07/24/2018, CONSULTATION DATE:  3/13 REFERRING MD:  Lupita Leash, CHIEF COMPLAINT:  Acute encephalopathy and worsening mental status    Brief History   78 year old male patient admitted initially on 3/7 with sepsis secondary secondary to pseudomonas urinary tract infection with associated acute on chronic renal failure secondary to obstructive uropathy.  Over the course of his hospitalization developed progressive encephalopathy, with concern for nonconvulsive status on 3/12.  This was felt possibly secondary to neurotoxicity from cefepime.  Intubated for airway protection. He was initially under propofol, and transition to Versed due to severe bradycardia on 3/16, Versed was stopped 3/17.  Past Medical History  Chronic urinary retention with prostate hypertrophy, prior Pseudomonas urinary tract infections, stage III chronic kidney disease, moderate to severe pulmonary hypertension, chronic diastolic heart failure, myelodysplasia with myelodysplastic syndrome, chronic back pain  Significant Hospital Events   3/7: Admitted with presumptive diagnosis of urinary tract infection 3/9: worsening renal failure, seen by urology, Foley catheter placed 3/12: Worsening mental status, EEG worrisome for possible nonconvulsive status.  Felt possibly due to neurotoxicity from cefepime this was discontinued and Zosyn was started.  lumbar puncture performed, for possible meningitis.  Infectious disease consulted, neurology consulted, started on Keppra, moved to [facility] for continuous EEG monitoring 3/13 remains unresponsive, developed worsening hypoxia staph from prior LP unremarkable without evidence of meningitis.  Pulmonary asked to evaluate 3/17 Episode of bradycardia overnight in spite of stopping propofol hence dopamine started. Episode of mucus plugging 3/17 versed stopped 3/24>> No improvement in neuro status despite off sedation s 3/26 >>  overnight with low hgb s/p transfusion, Afib w/RVR, hypotensive requiring pressors, oliguria 3/28 -A Fib RVR -Amio bolus x 1 , weaning pressors  4/01 - CRRT trial to end today  4/02 family conference with palliative care >> DNR  Consults:  ID Neurology Urology PCCM Nephrology  PMT   Procedures:  3/12 Fluoro-guided LP performed at L3-L4. Opening pressure 12 cm H20. 13.5 ml clear CSF obtained 3/13 ETT >> 3/17 R DL PICC >> 3/31 L IJ trialysis >>  Significant Diagnostic Tests:  CT abdomen pelvis 3/7 showed bilateral hydronephrosis with bladder wall thickening and multiple bladder diverticuli suggesting chronic bladder outlet obstruction with cystitis  Renal US 3/10  resolved bilateral hydronephrosis   MRI brain 3/12: Truncated and motion degraded examination without visible acute abnormality.  MRI L spine 3/12:1. Mild foraminal narrowing bilaterally at L4-5 is worse on the left.2. Chronic endplate changes at B3-4 with associated Schmorl's node. 3. Minimal rightward disc bulging without significant stenosis.4. 10 mm solid lesion at the upper pole of the right kidney was not well seen on recent CT scans. Small renal cell carcinoma is not excluded. Recommend dedicated MRI of the kidneys as the patient's condition allows. 5. Stable cyst at the lower pole of the right kidney.  EEG 3/12 frequent periodic discharges concerning for triphasic versus epileptiform GPD's 3/17 EEG >>burst suppression, triphasics GPD's US abdomen 3/12: 1. No acute findings. 2. Single visualized gallstone. No evidence of acute cholecystitis. No other abnormality.  MRI brain 3/21 neg  CT Head 3/24 No acute infarct, no hemorrhage no edema. Small lipoma between lateral ventricles, mild periventricular small vessel disease  Micro Data:  UC 3/7 pseudomonas (cipro resistant) Urine cx 3/12 >> pseudomonas 3/16 urine >> pseudomonas resp 3/18 >> providencia, multidrug-resistant (S ceftriaxone) 3/29 Trach asp>>  few providencia stuartii  Antimicrobials:  Cefepime 3/7>>3/12 Zosyn 3/12>>> 3/19 zyvox  3/13>>>3/15 ceftx 3/21 (HCAP ) >> 3/27 Zosyn 3/29 >>3/31 Meropenum 3/31 >>  Interim history/subjective:  Hypotensive.  Objective   Blood pressure (!) 114/57, pulse 94, temperature 98.6 F (37 C), resp. rate (!) 22, height 6\' 3"  (1.905 m), weight 103.8 kg, SpO2 99 %.    Vent Mode: PRVC FiO2 (%):  [40 %] 40 % Set Rate:  [16 bmp] 16 bmp Vt Set:  [670 mL] 670 mL PEEP:  [5 cmH20] 5 cmH20 Pressure Support:  [10 cmH20] 10 cmH20 Plateau Pressure:  [13 cmH20-25 cmH20] 25 cmH20   Intake/Output Summary (Last 24 hours) at 08/18/2018 1404 Last data filed at 08/18/2018 1200 Gross per 24 hour  Intake 2102.54 ml  Output 2763 ml  Net -660.46 ml   Filed Weights   08/16/18 0500 08/17/18 0500 08/18/18 0500  Weight: 108.7 kg 106.4 kg 103.8 kg   Examination:   General - unresponsive Eyes - pupils mid point ENT - ETT in place Cardiac - irregular Chest - diffuse b/l crackles Abdomen - soft, non tender, decreased bowel sounds Extremities - 3+ edema Skin - 9 cm sacral injury Neuro - doesn't follow commands  Assessment & Plan:   Acute metabolic encephalopathy. Discussion: No improvement with renal replacement. Plan - family planning for comfort measures this weekend  Acute respiratory failure with hypoxia. Hx of OSA, pulmonary hypertension. Plan - full vent support for now  Providencia HCAP. Pseudomonal UTI. Plan - continue ABx   AKI. CKD 3. Obstructive uropathy. Discussion: He is not able to tolerate renal replacement due to a fib/hypotension. Plan - no further renal replacement  New onset A fib with RVR during renal replacement. Hypotension during renal replacement. 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 Discussion: Innumerable lytic lesions on CT abdomen  Not  candidate for further tx per oncology visit 06/10/18. Plan - monitor clinically  Deep Tissue Sacral Injury (present prior to admission) Plan - wound care   Best practice:  Diet: TF DVT prophylaxis: SCDs GI prophylaxis: H2B Mobilitity BR Code Status: DNR Disposition:  ICU   CC time 29 minutes  Chesley Mires, MD [place] 08/18/2018, 2:13 PM

## 2018-08-19 LAB — GLUCOSE, CAPILLARY
Glucose-Capillary: 104 mg/dL — ABNORMAL HIGH (ref 70–99)
Glucose-Capillary: 127 mg/dL — ABNORMAL HIGH (ref 70–99)
Glucose-Capillary: 71 mg/dL (ref 70–99)
Glucose-Capillary: 85 mg/dL (ref 70–99)
Glucose-Capillary: 88 mg/dL (ref 70–99)
Glucose-Capillary: 95 mg/dL (ref 70–99)

## 2018-08-19 NOTE — Progress Notes (Signed)
NAME:  Juan [name], MRN:  [id number], DOB:  28-Feb-1941, LOS: 62 ADMISSION DATE:  08/14/2018, CONSULTATION DATE:  3/13 REFERRING MD:  Lupita Leash, CHIEF COMPLAINT:  Acute encephalopathy and worsening mental status    Brief History   78 year old male patient admitted initially on 3/7 with sepsis secondary secondary to pseudomonas urinary tract infection with associated acute on chronic renal failure secondary to obstructive uropathy.  Over the course of his hospitalization developed progressive encephalopathy, with concern for nonconvulsive status on 3/12.  This was felt possibly secondary to neurotoxicity from cefepime.  Intubated for airway protection. He was initially under propofol, and transition to Versed due to severe bradycardia on 3/16, Versed was stopped 3/17.  Past Medical History  Chronic urinary retention with prostate hypertrophy, prior Pseudomonas urinary tract infections, stage III chronic kidney disease, moderate to severe pulmonary hypertension, chronic diastolic heart failure, myelodysplasia with myelodysplastic syndrome, chronic back pain  Significant Hospital Events   3/7: Admitted with presumptive diagnosis of urinary tract infection 3/9: worsening renal failure, seen by urology, Foley catheter placed 3/12: Worsening mental status, EEG worrisome for possible nonconvulsive status.  Felt possibly due to neurotoxicity from cefepime this was discontinued and Zosyn was started.  lumbar puncture performed, for possible meningitis.  Infectious disease consulted, neurology consulted, started on Keppra, moved to [facility] for continuous EEG monitoring 3/13 remains unresponsive, developed worsening hypoxia staph from prior LP unremarkable without evidence of meningitis.  Pulmonary asked to evaluate 3/17 Episode of bradycardia overnight in spite of stopping propofol hence dopamine started. Episode of mucus plugging 3/17 versed stopped 3/24>> No improvement in neuro status despite off sedation s 3/26 >>  overnight with low hgb s/p transfusion, Afib w/RVR, hypotensive requiring pressors, oliguria 3/28 -A Fib RVR -Amio bolus x 1 , weaning pressors  4/01 - CRRT trial to end today  4/02 family conference with palliative care >> DNR with plans for comfort / terminal extubation on 4/4  Consults:  ID Neurology Urology PCCM Nephrology  PMT   Procedures:  3/12 Fluoro-guided LP performed at L3-L4. Opening pressure 12 cm H20. 13.5 ml clear CSF obtained 3/13 ETT >> 3/17 R DL PICC >> 3/31 L IJ trialysis >>  Significant Diagnostic Tests:  CT abdomen pelvis 3/7 showed bilateral hydronephrosis with bladder wall thickening and multiple bladder diverticuli suggesting chronic bladder outlet obstruction with cystitis Renal US 3/10  resolved bilateral hydronephrosis  MRI brain 3/12: Truncated and motion degraded examination without visible acute abnormality. MRI L spine 3/12:1. Mild foraminal narrowing bilaterally at L4-5 is worse on the left.2. Chronic endplate changes at B0-1 with associated Schmorl's node. 3. Minimal rightward disc bulging without significant stenosis.4. 10 mm solid lesion at the upper pole of the right kidney was not well seen on recent CT scans. Small renal cell carcinoma is not excluded. Recommend dedicated MRI of the kidneys as the patient's condition allows. 5. Stable cyst at the lower pole of the right kidney. EEG 3/12 frequent periodic discharges concerning for triphasic versus epileptiform GPD's 3/17 EEG >>burst suppression, triphasics GPD's US abdomen 3/12: 1. No acute findings. 2. Single visualized gallstone. No evidence of acute cholecystitis. No other abnormality. MRI brain 3/21 neg CT Head 3/24 No acute infarct, no hemorrhage no edema. Small lipoma between lateral ventricles, mild periventricular small vessel disease  Micro Data:  UC 3/7 pseudomonas (cipro resistant) Urine cx 3/12 >> pseudomonas 3/16 urine >> pseudomonas resp 3/18 >> providencia,  multidrug-resistant (S ceftriaxone) 3/29 Trach asp>> few providencia stuartii  Antimicrobials:  Cefepime 3/7>>3/12 Zosyn  3/12>>> 3/19 zyvox 3/13>>>3/15 ceftx 3/21 (HCAP ) >> 3/27 Zosyn 3/29 >>3/31 Meropenum 3/31 >>  Interim history/subjective:  No acute events.  Remains unresponsive.  Objective   Blood pressure (!) 108/50, pulse 98, temperature (!) 100.8 F (38.2 C), resp. rate (!) 22, height 6\' 3"  (1.905 m), weight 104.3 kg, SpO2 100 %.    Vent Mode: CPAP;PSV FiO2 (%):  [40 %] 40 % Set Rate:  [16 bmp] 16 bmp Vt Set:  [670 mL] 670 mL PEEP:  [5 cmH20] 5 cmH20 Pressure Support:  [8 cmH20] 8 cmH20 Plateau Pressure:  [12 cmH20-25 cmH20] 12 cmH20   Intake/Output Summary (Last 24 hours) at 08/19/2018 0831 Last data filed at 08/19/2018 3202 Gross per 24 hour  Intake 1502.48 ml  Output 1102 ml  Net 400.48 ml   Filed Weights   08/17/18 0500 08/18/18 0500 08/19/18 0500  Weight: 106.4 kg 103.8 kg 104.3 kg   Examination:  General: Adult male, unresponsive. Neuro: Unresponsive. HEENT: [place]/AT. Sclerae anicteric.  ETT in place. Cardiovascular: RRR, no M/R/G.  Lungs: Respirations even and unlabored.  CTA bilaterally, No W/R/R. Abdomen: BS x 4, soft, NT/ND.  Musculoskeletal: No gross deformities, 2+ edema.  Skin: Intact, warm, no rashes.  Assessment & Plan:   Acute metabolic encephalopathy. Discussion: No improvement with renal replacement. Plan - family planning for comfort measures 4/4  Acute respiratory failure with hypoxia. Hx of OSA, pulmonary hypertension. Plan - full vent support until terminal extubation 4/4  Providencia HCAP. Pseudomonal UTI. Plan - continue ABx  Hypotension. - Continue levophed   AKI. CKD 3. Obstructive uropathy. Discussion: He is not able to tolerate renal replacement due to a fib/hypotension. Plan - no further renal replacement  New onset A fib with RVR during renal replacement. Hypotension during renal replacement. 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 Discussion: Innumerable lytic lesions on CT abdomen  Not candidate for further tx per oncology visit 06/10/18. Plan - No interventions required  Deep Tissue Sacral Injury (present prior to admission) Plan - wound care d/c   Best practice:  Diet: TF DVT prophylaxis: SCDs GI prophylaxis: H2B Mobilitity BR Code Status: DNR, plans for terminal extubation / comfort measures 4/4 Disposition:  ICU   CC time 30 min  Shristi Scheib Shearon Stalls, PA - [facility] Pager: [phone] - [phone].  If no answer, [phone] 08/19/2018, 8:36 AM

## 2018-08-19 NOTE — Progress Notes (Signed)
08/18/17. 2115. Video assisted visit with patients daughter. Herbie Baltimore RN answered all questions regarding care and vital signs. Family appreciative of phone visit.                Taysen Bushart RN,BSN,CCRN

## 2018-08-19 NOTE — Progress Notes (Signed)
Subjective: No overnight events.  Remained unresponsive with some spontaneous breathing.  Family is planning to withdraw care tomorrow.  Feed has been stopped, no further labs and patient is basically managed as comfort care.  Objective: Current vital signs: BP (!) 108/50   Pulse 98   Temp (!) 100.8 F (38.2 C)   Resp (!) 22   Ht '6\' 3"'$  (1.905 m)   Wt 104.3 kg   SpO2 100%   BMI 28.74 kg/m  Vital signs in last 24 hours: Temp:  [98.6 F (37 C)-101.8 F (38.8 C)] 100.8 F (38.2 C) (04/03 0700) Pulse Rate:  [23-107] 98 (04/03 0740) Resp:  [14-26] 22 (04/03 0740) BP: (83-131)/(38-75) 108/50 (04/03 0740) SpO2:  [94 %-100 %] 100 % (04/03 0615) FiO2 (%):  [40 %] 40 % (04/03 0740) Weight:  [104.3 kg] 104.3 kg (04/03 0500)  Intake/Output from previous day: 04/02 0701 - 04/03 0700 In: 1548.8 [I.V.:1101.4; NG/GT:30; IV Piggyback:417.4] Out: 976 [Stool:300] Intake/Output this shift: No intake/output data recorded. Nutritional status:  Diet Order            Diet NPO time specified  Diet effective now              Neurologic Exam: Ment: Eyes closed. Not following any commands.  No response to any noxious stimuli today. CN: Pupils: 3 mm on left, 4 mm on right and with sluggish response to light. Weak corneal reflexes bilaterally. Face flaccidly symmetric.  Motor/Sensory: Flaccid tone x 4 with no movement to any stimuli  Reflexes: Hypoactive.   Lab Results: Results for orders placed or performed during the hospital encounter of 07/18/2018 (from the past 48 hour(s))  POCT Activated clotting time     Status: None   Collection Time: 08/17/18  9:02 AM  Result Value Ref Range   Activated Clotting Time 147 seconds  POCT Activated clotting time     Status: None   Collection Time: 08/17/18 10:04 AM  Result Value Ref Range   Activated Clotting Time 142 seconds  POCT Activated clotting time     Status: None   Collection Time: 08/17/18 11:05 AM  Result Value Ref Range   Activated  Clotting Time 153 seconds  Glucose, capillary     Status: Abnormal   Collection Time: 08/17/18 11:57 AM  Result Value Ref Range   Glucose-Capillary 231 (H) 70 - 99 mg/dL  POCT Activated clotting time     Status: None   Collection Time: 08/17/18 11:59 AM  Result Value Ref Range   Activated Clotting Time 142 seconds  POCT Activated clotting time     Status: None   Collection Time: 08/17/18 12:59 PM  Result Value Ref Range   Activated Clotting Time 120 seconds  POCT Activated clotting time     Status: None   Collection Time: 08/17/18  2:14 PM  Result Value Ref Range   Activated Clotting Time 158 seconds  POCT Activated clotting time     Status: None   Collection Time: 08/17/18  3:03 PM  Result Value Ref Range   Activated Clotting Time 153 seconds  Glucose, capillary     Status: Abnormal   Collection Time: 08/17/18  3:53 PM  Result Value Ref Range   Glucose-Capillary 156 (H) 70 - 99 mg/dL  POCT Activated clotting time     Status: None   Collection Time: 08/17/18  3:53 PM  Result Value Ref Range   Activated Clotting Time 158 seconds  POCT Activated clotting time  Status: None   Collection Time: 08/17/18  4:56 PM  Result Value Ref Range   Activated Clotting Time 147 seconds  Renal function panel (daily at 1600)     Status: Abnormal   Collection Time: 08/17/18  5:15 PM  Result Value Ref Range   Sodium 135 135 - 145 mmol/L   Potassium 3.7 3.5 - 5.1 mmol/L   Chloride 101 98 - 111 mmol/L   CO2 23 22 - 32 mmol/L   Glucose, Bld 146 (H) 70 - 99 mg/dL   BUN 50 (H) 8 - 23 mg/dL   Creatinine, Ser 1.23 0.61 - 1.24 mg/dL   Calcium 8.0 (L) 8.9 - 10.3 mg/dL   Phosphorus 2.2 (L) 2.5 - 4.6 mg/dL   Albumin 1.1 (L) 3.5 - 5.0 g/dL   GFR calc non Af Amer 57 (L) >60 mL/min   GFR calc Af Amer >60 >60 mL/min   Anion gap 11 5 - 15    Comment: Performed at [facility] [address], [place], [place] [address]  POCT Activated clotting time     Status: None   Collection Time: 08/17/18   6:06 PM  Result Value Ref Range   Activated Clotting Time 164 seconds  POCT Activated clotting time     Status: None   Collection Time: 08/17/18  7:19 PM  Result Value Ref Range   Activated Clotting Time 158 seconds  Glucose, capillary     Status: Abnormal   Collection Time: 08/17/18  7:55 PM  Result Value Ref Range   Glucose-Capillary 172 (H) 70 - 99 mg/dL  POCT Activated clotting time     Status: None   Collection Time: 08/17/18  8:04 PM  Result Value Ref Range   Activated Clotting Time 186 seconds  POCT Activated clotting time     Status: None   Collection Time: 08/17/18  9:10 PM  Result Value Ref Range   Activated Clotting Time 191 seconds  POCT Activated clotting time     Status: None   Collection Time: 08/17/18 10:05 PM  Result Value Ref Range   Activated Clotting Time 186 seconds  POCT Activated clotting time     Status: None   Collection Time: 08/17/18 11:08 PM  Result Value Ref Range   Activated Clotting Time 175 seconds  Glucose, capillary     Status: Abnormal   Collection Time: 08/17/18 11:48 PM  Result Value Ref Range   Glucose-Capillary 159 (H) 70 - 99 mg/dL  POCT Activated clotting time     Status: None   Collection Time: 08/18/18 12:06 AM  Result Value Ref Range   Activated Clotting Time 180 seconds  POCT Activated clotting time     Status: None   Collection Time: 08/18/18 12:57 AM  Result Value Ref Range   Activated Clotting Time 186 seconds  POCT Activated clotting time     Status: None   Collection Time: 08/18/18  2:00 AM  Result Value Ref Range   Activated Clotting Time 180 seconds  POCT Activated clotting time     Status: None   Collection Time: 08/18/18  3:05 AM  Result Value Ref Range   Activated Clotting Time 180 seconds  Glucose, capillary     Status: Abnormal   Collection Time: 08/18/18  3:51 AM  Result Value Ref Range   Glucose-Capillary 164 (H) 70 - 99 mg/dL  Valproic acid level     Status: Abnormal   Collection Time: 08/18/18  3:54 AM   Result  Value Ref Range   Valproic Acid Lvl 114 (H) 50.0 - 100.0 ug/mL    Comment: RESULTS CONFIRMED BY MANUAL DILUTION Performed at [facility] [address], [place], [place] [address]   Renal function panel (daily at 0500)     Status: Abnormal   Collection Time: 08/18/18  3:54 AM  Result Value Ref Range   Sodium 135 135 - 145 mmol/L   Potassium 3.5 3.5 - 5.1 mmol/L   Chloride 102 98 - 111 mmol/L   CO2 24 22 - 32 mmol/L   Glucose, Bld 243 (H) 70 - 99 mg/dL   BUN 39 (H) 8 - 23 mg/dL   Creatinine, Ser 0.94 0.61 - 1.24 mg/dL   Calcium 7.8 (L) 8.9 - 10.3 mg/dL   Phosphorus 1.7 (L) 2.5 - 4.6 mg/dL   Albumin 1.0 (L) 3.5 - 5.0 g/dL   GFR calc non Af Amer >60 >60 mL/min   GFR calc Af Amer >60 >60 mL/min   Anion gap 9 5 - 15    Comment: Performed at [facility] [address], [place], [place] [address]  Magnesium     Status: None   Collection Time: 08/18/18  3:54 AM  Result Value Ref Range   Magnesium 2.3 1.7 - 2.4 mg/dL    Comment: Performed at [facility] [address] [place], [place] [address]  APTT     Status: Abnormal   Collection Time: 08/18/18  3:54 AM  Result Value Ref Range   aPTT 69 (H) 24 - 36 seconds    Comment:        IF BASELINE aPTT IS ELEVATED, SUGGEST PATIENT RISK ASSESSMENT BE USED TO DETERMINE APPROPRIATE ANTICOAGULANT THERAPY. Performed at [facility], [place] [address], [place], [place] [address]   CBC     Status: Abnormal   Collection Time: 08/18/18  3:54 AM  Result Value Ref Range   WBC 45.6 (H) 4.0 - 10.5 K/uL   RBC 2.80 (L) 4.22 - 5.81 MIL/uL   Hemoglobin 7.0 (L) 13.0 - 17.0 g/dL   HCT 22.2 (L) 39.0 - 52.0 %   MCV 79.3 (L) 80.0 - 100.0 fL   MCH 25.0 (L) 26.0 - 34.0 pg   MCHC 31.5 30.0 - 36.0 g/dL   RDW 19.9 (H) 11.5 - 15.5 %   Platelets 135 (L) 150 - 400 K/uL    Comment: REPEATED TO VERIFY Immature Platelet Fraction may be clinically indicated, consider ordering this additional test [id number]    nRBC 1.0 (H)  0.0 - 0.2 %    Comment: Performed at [facility], [place] [address] [place], [place] [address]  Basic metabolic panel     Status: Abnormal   Collection Time: 08/18/18  3:54 AM  Result Value Ref Range   Sodium 136 135 - 145 mmol/L   Potassium 3.5 3.5 - 5.1 mmol/L   Chloride 102 98 - 111 mmol/L   CO2 25 22 - 32 mmol/L   Glucose, Bld 237 (H) 70 - 99 mg/dL   BUN 38 (H) 8 - 23 mg/dL   Creatinine, Ser 1.02 0.61 - 1.24 mg/dL   Calcium 7.7 (L) 8.9 - 10.3 mg/dL   GFR calc non Af Amer >60 >60 mL/min   GFR calc Af Amer >60 >60 mL/min   Anion gap 9 5 - 15    Comment: Performed at [facility], [place] [address], [place], [place] [address]  POCT Activated clotting  time     Status: None   Collection Time: 08/18/18  4:06 AM  Result Value Ref Range   Activated Clotting Time 186 seconds  POCT Activated clotting time     Status: None   Collection Time: 08/18/18  5:05 AM  Result Value Ref Range   Activated Clotting Time 191 seconds  POCT Activated clotting time     Status: None   Collection Time: 08/18/18  6:03 AM  Result Value Ref Range   Activated Clotting Time 180 seconds  POCT Activated clotting time     Status: None   Collection Time: 08/18/18  6:59 AM  Result Value Ref Range   Activated Clotting Time 186 seconds  POCT Activated clotting time     Status: None   Collection Time: 08/18/18  8:11 AM  Result Value Ref Range   Activated Clotting Time 169 seconds  POCT Activated clotting time     Status: None   Collection Time: 08/18/18  9:08 AM  Result Value Ref Range   Activated Clotting Time 169 seconds  POCT Activated clotting time     Status: None   Collection Time: 08/18/18 10:25 AM  Result Value Ref Range   Activated Clotting Time 169 seconds  POCT Activated clotting time     Status: None   Collection Time: 08/18/18 11:21 AM  Result Value Ref Range   Activated Clotting Time 175 seconds  Valproic acid level     Status: Abnormal   Collection Time: 08/18/18 11:45 AM  Result  Value Ref Range   Valproic Acid Lvl <10 (L) 50.0 - 100.0 ug/mL    Comment: RESULTS CONFIRMED BY MANUAL DILUTION Performed at [facility], [address] [place], [place] [address]   Glucose, capillary     Status: Abnormal   Collection Time: 08/18/18  1:09 PM  Result Value Ref Range   Glucose-Capillary 416 (H) 70 - 99 mg/dL   Comment 1 Repeat Test   Glucose, capillary     Status: Abnormal   Collection Time: 08/18/18  1:17 PM  Result Value Ref Range   Glucose-Capillary 124 (H) 70 - 99 mg/dL   Comment 1 Document in Chart   Glucose, capillary     Status: Abnormal   Collection Time: 08/18/18  5:09 PM  Result Value Ref Range   Glucose-Capillary 114 (H) 70 - 99 mg/dL  Glucose, capillary     Status: Abnormal   Collection Time: 08/18/18  8:19 PM  Result Value Ref Range   Glucose-Capillary 116 (H) 70 - 99 mg/dL  Glucose, capillary     Status: Abnormal   Collection Time: 08/18/18 11:59 PM  Result Value Ref Range   Glucose-Capillary 104 (H) 70 - 99 mg/dL  Glucose, capillary     Status: None   Collection Time: 08/19/18  4:00 AM  Result Value Ref Range   Glucose-Capillary 88 70 - 99 mg/dL    Recent Results (from the past 240 hour(s))  Culture, respiratory     Status: None   Collection Time: 08/10/18  3:57 AM  Result Value Ref Range Status   Specimen Description ENDOTRACHEAL  Final   Special Requests NONE  Final   Gram Stain   Final    ABUNDANT WBC PRESENT, PREDOMINANTLY PMN NO ORGANISMS SEEN    Culture   Final    RARE Consistent with normal respiratory flora. Performed at [facility], [place] [address], [place], [place] [address]    Report Status 08/12/2018 FINAL  Final  Culture, Urine     Status: None   Collection Time: 08/10/18  5:00 AM  Result Value Ref Range Status   Specimen Description URINE, CATHETERIZED  Final   Special Requests NONE  Final   Culture   Final    NO GROWTH Performed at [facility], [address] [place], [place] [address]     Report Status 08/11/2018 FINAL  Final  Culture, blood (routine x 2)     Status: None   Collection Time: 08/10/18  5:16 AM  Result Value Ref Range Status   Specimen Description BLOOD LEFT FOREARM  Final   Special Requests   Final    BOTTLES DRAWN AEROBIC AND ANAEROBIC Blood Culture adequate volume   Culture   Final    NO GROWTH 5 DAYS Performed at [facility], [place] [address], [place], [place] [address]    Report Status 08/15/2018 FINAL  Final  Culture, blood (routine x 2)     Status: None   Collection Time: 08/10/18  5:26 AM  Result Value Ref Range Status   Specimen Description BLOOD LEFT HAND  Final   Special Requests   Final    BOTTLES DRAWN AEROBIC AND ANAEROBIC Blood Culture adequate volume   Culture   Final    NO GROWTH 5 DAYS Performed at [facility], [place] [address] [place], [place] [address]    Report Status 08/15/2018 FINAL  Final  Culture, respiratory (non-expectorated)     Status: None   Collection Time: 08/14/18  1:21 PM  Result Value Ref Range Status   Specimen Description TRACHEAL ASPIRATE  Final   Special Requests NONE  Final   Gram Stain   Final    NO SQUAMOUS EPITHELIAL CELLS PRESENT FEW WBC PRESENT,BOTH PMN AND MONONUCLEAR FEW GRAM NEGATIVE RODS RARE GRAM POSITIVE COCCI IN CLUSTERS Performed at [facility], [address] [place], [place] [address]    Culture FEW PROVIDENCIA STUARTII  Final   Report Status 08/16/2018 FINAL  Final   Organism ID, Bacteria PROVIDENCIA STUARTII  Final      Susceptibility   Providencia stuartii - MIC*    AMPICILLIN >=32 RESISTANT Resistant     CEFAZOLIN >=64 RESISTANT Resistant     CEFEPIME 32 RESISTANT Resistant     CEFTAZIDIME >=64 RESISTANT Resistant     CEFTRIAXONE 2 SENSITIVE Sensitive     CIPROFLOXACIN >=4 RESISTANT Resistant     GENTAMICIN RESISTANT Resistant     IMIPENEM 1 SENSITIVE Sensitive     TRIMETH/SULFA <=20 SENSITIVE Sensitive     AMPICILLIN/SULBACTAM >=32 RESISTANT Resistant      PIP/TAZO 32 INTERMEDIATE Intermediate     * FEW PROVIDENCIA STUARTII    Lipid Panel No results for input(s): CHOL, TRIG, HDL, CHOLHDL, VLDL, LDLCALC in the last 72 hours.  Studies/Results: Dg Chest Port 1 View  Result Date: 08/18/2018 CLINICAL DATA:  Respiratory failure EXAM: PORTABLE CHEST 1 VIEW COMPARISON:  08/15/2018 FINDINGS: Support devices are stable. Cardiomegaly with vascular congestion. Left perihilar and bibasilar airspace opacities have worsened since prior study. This could reflect edema or atelectasis. No visible effusions or acute bony abnormality. IMPRESSION: Left perihilar and bibasilar airspace opacities, slightly worsening, possibly edema or atelectasis. Electronically Signed   By: Rolm Baptise M.D.   On: 08/18/2018 07:54    Medications:  Scheduled: . sodium chloride   Intravenous Once  . chlorhexidine gluconate (MEDLINE KIT)  15 mL Mouth Rinse BID  . Chlorhexidine Gluconate  Cloth  6 each Topical Daily  . clotrimazole  1 application Topical BID  . famotidine  20 mg Per Tube Daily  . lacosamide  200 mg Per Tube BID  . levETIRAcetam  500 mg Per Tube BID  . mouth rinse  15 mL Mouth Rinse 10 times per day  . polyvinyl alcohol  1 drop Both Eyes QID  . valproic acid  1,000 mg Per Tube QHS  . valproic acid  750 mg Per Tube q morning - 10a   Continuous: .  prismasol BGK 4/2.5 700 mL/hr at 08/18/18 0543  .  prismasol BGK 4/2.5 700 mL/hr at 08/18/18 0521  . sodium chloride 10 mL/hr at 08/19/18 0700  . amiodarone 30 mg/hr (08/19/18 0700)  . heparin 10,000 units/ 20 mL infusion syringe 2,400 Units/hr (08/18/18 1029)  . meropenem (MERREM) IV Stopped (08/18/18 1653)  . norepinephrine (LEVOPHED) Adult infusion 10 mcg/min (08/19/18 0749)  . prismasol BGK 4/2.5 2,000 mL/hr at 08/18/18 0479    AssessmentRecommendations:  79 year old male with multiorgan failure and severe multifactorial toxic-metabolic encephalopathy  1. He has not improved despite being off sedation for many  days now.   2. Family is planning to withdraw care tomorrow.   3. Feeds have been stopped.  4. No further labs and patient is basically managed as comfort care.  5. CRRT was discontinued yesterday. 6. Neurology will sign off as patient is mostly comfort care now.    LOS: 26 days    Lorella Nimrod PGY 3 Pager [phone]  Electronically signed: Dr. Kerney Elbe

## 2018-08-19 NOTE — Progress Notes (Signed)
Patient ID: Juan [name], male   DOB: January 14, 1941, 78 y.o.   MRN: [id number] Will not follow formally with plans to withdraw tomorrow.

## 2018-08-20 LAB — GLUCOSE, CAPILLARY
Glucose-Capillary: 102 mg/dL — ABNORMAL HIGH (ref 70–99)
Glucose-Capillary: 66 mg/dL — ABNORMAL LOW (ref 70–99)
Glucose-Capillary: 79 mg/dL (ref 70–99)
Glucose-Capillary: 81 mg/dL (ref 70–99)
Glucose-Capillary: 83 mg/dL (ref 70–99)
Glucose-Capillary: 90 mg/dL (ref 70–99)

## 2018-08-20 MED ORDER — MORPHINE BOLUS VIA INFUSION
1.0000 mg | INTRAVENOUS | Status: DC | PRN
  Administered 2018-08-20: 21:00:00 2 mg via INTRAVENOUS
  Filled 2018-08-20: qty 10

## 2018-08-20 MED ORDER — MORPHINE 100MG IN NS 100ML (1MG/ML) PREMIX INFUSION
1.0000 mg/h | INTRAVENOUS | Status: DC
  Administered 2018-08-20: 2 mg/h via INTRAVENOUS
  Filled 2018-08-20: qty 100

## 2018-08-20 MED ORDER — LORAZEPAM 2 MG/ML IJ SOLN
2.0000 mg | Freq: Four times a day (QID) | INTRAMUSCULAR | Status: DC
  Administered 2018-08-20: 2 mg via INTRAVENOUS
  Filled 2018-08-20: qty 1

## 2018-08-20 MED ORDER — LORAZEPAM 2 MG/ML IJ SOLN: 1.0000 mg | INTRAMUSCULAR | Status: DC | PRN

## 2018-08-20 MED ORDER — AMIODARONE HCL 200 MG PO TABS
200.0000 mg | ORAL_TABLET | Freq: Every day | ORAL | Status: DC
  Administered 2018-08-20: 200 mg via ORAL
  Filled 2018-08-20: qty 1

## 2018-08-20 MED ORDER — MIDODRINE HCL 5 MG PO TABS
10.0000 mg | ORAL_TABLET | Freq: Three times a day (TID) | ORAL | Status: DC
  Filled 2018-08-20: qty 2

## 2018-08-20 MED ORDER — GLYCOPYRROLATE 0.2 MG/ML IJ SOLN
0.4000 mg | Freq: Four times a day (QID) | INTRAMUSCULAR | Status: DC
  Administered 2018-08-20: 17:00:00 0.4 mg via INTRAVENOUS
  Filled 2018-08-20: qty 2

## 2018-08-21 ENCOUNTER — Encounter: Payer: Self-pay | Admitting: Hematology & Oncology

## 2018-08-22 DIAGNOSIS — Z515 Encounter for palliative care: Secondary | ICD-10-CM

## 2018-08-22 LAB — CULTURE, FUNGUS WITHOUT SMEAR

## 2018-08-24 ENCOUNTER — Telehealth: Payer: Self-pay | Admitting: *Deleted

## 2018-08-24 NOTE — Telephone Encounter (Signed)
Received Original D/C from [place] ,D/C forwarded to [place] for signature.

## 2018-08-29 ENCOUNTER — Telehealth: Payer: Self-pay | Admitting: Pulmonary Disease

## 2018-08-29 NOTE — Telephone Encounter (Signed)
08/29/18 received signed d/c from dr.Agarwala, will call and fax to [facility]. PWR

## 2018-09-06 ENCOUNTER — Encounter: Payer: Self-pay | Admitting: *Deleted

## 2018-09-07 NOTE — Telephone Encounter (Signed)
This encounter was created in error - please disregard.

## 2018-09-16 NOTE — Progress Notes (Signed)
Wasted 50 mL morphine with Antony Haste, RN.

## 2018-09-16 NOTE — Progress Notes (Signed)
Family  In orders available for withdrawal. Daughter. " hoping for a miracle". Explained withdrawal   Of care, turning on morphine for comfort, increasing it if he becomes uncomfortable or heavily breathing. Levophed off. Explained we are still giving antisiezure medication.    o2 saturations  Dropping to 70's o2 placed on patient for comfort .

## 2018-09-16 NOTE — Progress Notes (Signed)
Family here at the bedside  Praying with patient. palliative care ( sarah)  With patient and family.

## 2018-09-16 NOTE — Procedures (Signed)
Extubation Procedure Note  Patient Details:   Name: Juan [name] DOB: [birth year] MRN: [id number]   Airway Documentation:    Vent end date: 09/01/18 Vent end time: 1735   Evaluation  O2 sats: transiently fell during during procedure Complications: No apparent complications Patient did not tolerate procedure well. Bilateral Breath Sounds: Diminished, Rhonchi   No.  Pt extubated to comfort care per physician order and family request. Pt extubated to 2L per family request for comfort. No stridor was heard, pt unable to cough or say name at this time. Family and RN at bedside.   Sharla Kidney 09-01-2018, 5:43 PM

## 2018-09-16 NOTE — Progress Notes (Signed)
In room with patient respirations heavier, increased morphine to 46ml/hr. Gospel music playing in room for patient. sats continue in 68-71%

## 2018-09-16 NOTE — Death Summary Note (Signed)
DEATH SUMMARY   Patient Details  Name: Juan [name] MRN: [id number] DOB: 1940-12-31  Admission/Discharge Information   Admit Date:  08/19/2018  Date of Death: Date of Death: Sep 18, 2018  Time of Death: Time of Death: August 20, 2216  Length of Stay: 09/11/22  Referring Physician: Colon Branch, MD   Reason(s) for Hospitalization  Septic shock from urinary tract infection  Diagnoses  Preliminary cause of death:  Secondary Diagnoses (including complications and co-morbidities):  Principal Problem:   Complicated UTI (urinary tract infection) Active Problems:   Coronary atherosclerosis   CKD (chronic kidney disease) stage 3, GFR 30-59 ml/min (HCC)   Acute respiratory failure (HCC)   Hydronephrosis   Iron deficiency anemia due to chronic blood loss   Hypokalemia   Encephalopathy acute   Moderate to severe pulmonary hypertension (HCC)   Chronic respiratory failure with hypoxia (HCC)   Chronic diastolic CHF (congestive heart failure), NYHA class 2 (HCC)   Myelodysplasia (myelodysplastic syndrome) (HCC)   HTN (hypertension)   Normocytic anemia   Back pain   Acute respiratory failure with hypoxemia (HCC)   Aspiration pneumonia (HCC)   AKI (acute kidney injury) ([place])   Pressure injury of skin   Acute pulmonary edema (HCC)   Terminal care   Bilateral hydronephrosis   Paroxysmal atrial fibrillation with rapid ventricular response ([place])   Hypothermia not due to cold exposure   Acute renal failure with acute tubular necrosis superimposed on stage 4 chronic kidney disease ([place])   Palliative care by specialist   Brief Hospital Course (including significant findings, care, treatment, and services provided and events leading to death)  Juan [name] is a 78 y.o. year old male who has been admitted 08/21/22 withwith sepsis secondary secondary to pseudomonas urinary tract infection with associated acute on chronic renal failure secondary to obstructive uropathy.  Over the course of his hospitalization developed  progressive encephalopathy, with concern for nonconvulsive status on 3/12.  This was felt possibly secondary to neurotoxicity from cefepime.  Intubated for airway protection.  He was made a DNR with plan to transition to comfort care when his mental status did not improve despite adequate seizure control.  He was extubated on Sep 18, 2022 and passed away promptly  Pertinent Labs and Studies  Significant Diagnostic Studies Dg Chest 1 View  Result Date: 07/29/2018 CLINICAL DATA:  Hypoxia EXAM: CHEST  1 VIEW COMPARISON:  07/28/2018 FINDINGS: Normal heart size. Chronic asymmetric elevation of the right hemidiaphragm. Bilateral interstitial and airspace opacities are identified. No significant change in aeration a lungs compared with previous exam. 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 IMPRESSION: No change in aeration a lungs compared with previous exam. Electronically Signed   By: Kerby Moors M.D.   On: 07/29/2018 09:39   Dg Chest 1 View  Result Date: 07/28/2018 CLINICAL DATA:  Leukocytosis. Fever. EXAM: CHEST  1 VIEW COMPARISON:  08/23/2018 FINDINGS: Cardiomediastinal silhouette is enlarged. Mediastinal contours appear intact. There is no evidence of pneumothorax. Chronic elevation of the right hemidiaphragm. Increased interstitial markings with additional peribronchovascular airspace consolidation in the lung bases. Diffusely mottled appearance of the osseous structures. Soft tissues are grossly normal. IMPRESSION: 1. Enlarged cardiac silhouette. 2. Increased interstitial markings with additional peribronchovascular airspace consolidation in the lung bases may represent mixed pattern pulmonary edema or developing multifocal pneumonia. 3. Diffusely mottled appearance of the osseous structures, likely due to diffuse osseous metastatic disease or myeloproliferative disorder. Electronically Signed   By: Linwood Dibbles.D.  On:  07/28/2018 11:21   Dg Chest 2 View  Result Date: 07/28/2018 CLINICAL DATA:  Back pain, leg pain EXAM: CHEST - 2 VIEW COMPARISON:  03/24/2018 FINDINGS: Low volume examination with mild diffuse interstitial pulmonary opacity, similar to prior examination and possibly reflecting mild edema. There is no new or focal airspace opacity. Cardiomegaly. 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 IMPRESSION: 1. Low volume examination with possible mild interstitial edema. No new or focal airspace opacity. 2.  Cardiomegaly. 3. 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 Electronically Signed   By: Eddie Candle M.D.   On: 07/30/2018 20:24   Dg Lumbar Spine Complete  Result Date: 08/16/2018 CLINICAL DATA:  Back pain and left leg pain. No known injury. EXAM: LUMBAR SPINE - COMPLETE 4+ VIEW COMPARISON:  Radiograph 09/06/2017, CT 09/05/2017 FINDINGS: Lateral view limited by positioning. There 4 non-rib-bearing lumbar vertebra. Chronic diffuse bony sclerosis with punctate lucencies, unchanged from prior exam. The alignment is grossly maintained. Vertebral body heights are normal. There is no listhesis. The posterior elements are intact. Disc spaces are preserved. No fracture. Sacroiliac joints are congruent. IMPRESSION: 1. No acute abnormality. 2. Chronic and unchanged diffuse bony sclerosis with punctate lucencies from multiple prior exams, likely related to patient's history of myelodysplasia. Electronically Signed   By: Keith Rake M.D.   On: 08/10/2018 20:31   Ct Head Wo Contrast  Result Date: 08/09/2018 CLINICAL DATA:  Altered mental status/unresponsive EXAM: CT HEAD WITHOUT CONTRAST TECHNIQUE: Contiguous axial images were obtained from the base of the skull through the vertex without intravenous contrast. COMPARISON:  Head CT July 28, 2018 and brain MRI August 06, 2018; prior head CT  August 18, 2017 FINDINGS: Brain: There is age related volume loss. There is a stable small lipoma in the midline between the lateral ventricles somewhat posteriorly measuring 0.9 x 0.8 cm. No other evident mass. There is no intracranial hemorrhage, extra-axial fluid collection, or midline shift. There is mild small vessel disease in the centra semiovale bilaterally. No acute appearing infarct is evident. Vascular: There is no appreciable hyperdense vessel. There is calcification in each carotid siphon region. Skull: The bony calvarium appears intact. Sinuses/Orbits: There is mucosal thickening in several ethmoid air cells. Other visualized paranasal sinuses are clear. Visualized orbits appear symmetric bilaterally. Other: Mastoid air cells are clear. IMPRESSION: Mild periventricular small vessel disease. No acute infarct. 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 No hemorrhage or edema. Foci of arterial vascular calcification noted. There is mucosal thickening in several ethmoid air cells. Electronically Signed   By: Lowella Grip III M.D.   On: 08/09/2018 14:02   Ct Head Wo Contrast  Result Date: 07/28/2018 CLINICAL DATA:  Altered level of consciousness EXAM: CT HEAD WITHOUT CONTRAST TECHNIQUE: Contiguous axial images were obtained from the base of the skull through the vertex without intravenous contrast. COMPARISON:  Two days ago FINDINGS: Brain: Apparent low-density in the right occipital region is attributed to streak artifact. There is no edema, consolidation, effusion, or pneumothorax. Mild small vessel ischemic gliosis in the periventricular white matter. Mild cerebral volume loss. Vascular: No hyperdense vessel or unexpected calcification. Skull: Normal. Negative for fracture or focal lesion. Sinuses/Orbits: Negative IMPRESSION: No acute finding or change from prior. Electronically Signed   By: Monte Fantasia M.D.   On: 07/28/2018 07:51   Ct Head Wo Contrast  Result Date:  07/26/2018 CLINICAL DATA:  78 year old male with RIGHT UPPER extremity weakness and  generalized muscle weakness. EXAM: CT HEAD WITHOUT CONTRAST TECHNIQUE: Contiguous axial images were obtained from the base of the skull through the vertex without intravenous contrast. COMPARISON:  08/18/2017 and prior CTs FINDINGS: Brain: No evidence of acute infarction, hemorrhage, hydrocephalus, extra-axial collection or mass lesion/mass effect. Mild atrophy and chronic small-vessel white matter ischemic changes noted. Vascular: No hyperdense vessel or unexpected calcification. Skull: Normal. Negative for fracture or focal lesion. Sinuses/Orbits: No acute finding. Other: None. IMPRESSION: 1. No evidence of acute intracranial abnormality 2. Mild atrophy and chronic small-vessel white matter ischemic changes. Electronically Signed   By: Margarette Canada M.D.   On: 07/26/2018 13:46   Mr Brain Wo Contrast  Result Date: 08/06/2018 CLINICAL DATA:  Encephalopathy.  Possible status epilepticus EXAM: MRI HEAD WITHOUT CONTRAST TECHNIQUE: Multiplanar, multiecho pulse sequences of the brain and surrounding structures were obtained without intravenous contrast. COMPARISON:  MRI head 07/28/2018 FINDINGS: Brain: Mild atrophy without hydrocephalus. Negative for acute infarct. Mild chronic microvascular ischemic change in the white matter. Negative for hemorrhage mass or fluid collection. No midline shift. Vascular: Normal arterial flow voids Skull and upper cervical spine: Negative Sinuses/Orbits: Mild mucosal edema paranasal sinuses.  Normal orbit Other: None IMPRESSION: No acute intracranial abnormality. Mild atrophy and mild chronic microvascular ischemic changes in the white matter. Electronically Signed   By: Franchot Gallo M.D.   On: 08/06/2018 12:20   Mr Brain Wo Contrast  Result Date: 07/28/2018 CLINICAL DATA:  Encephalopathy EXAM: MRI HEAD WITHOUT CONTRAST TECHNIQUE: Multiplanar, multiecho pulse sequences of the brain and surrounding  structures were obtained without intravenous contrast. COMPARISON:  Head CT 07/28/2018 FINDINGS: The examination had to be discontinued prior to completion due to patient altered mental status and inability to cooperate with the technologist's instructions. Axial DWI, SWI, T2 and FLAIR sequences were obtained. Additionally, the examination is degraded by motion. There is no acute ischemia. There is mild periventricular white matter hyperintensity compatible with chronic small vessel disease. No age advanced atrophy. No intracranial hemorrhage is visible. IMPRESSION: Truncated and motion degraded examination without visible acute abnormality. Electronically Signed   By: Ulyses Jarred M.D.   On: 07/28/2018 22:34   Mr Lumbar Spine Wo Contrast  Result Date: 07/28/2018 CLINICAL DATA:  Back pain.  Cauda equina syndrome suspected. EXAM: MRI LUMBAR SPINE WITHOUT CONTRAST TECHNIQUE: Multiplanar, multisequence MR imaging of the lumbar spine was performed. No intravenous contrast was administered. COMPARISON:  None. FINDINGS: Segmentation: 5 non rib-bearing lumbar type vertebral bodies are present. The lowest fully formed vertebral body is L5. Alignment: Slight retrolisthesis is present at L4-5. AP alignment is otherwise anatomic. There is some straightening of the normal lumbar lordosis. Vertebrae: Chronic endplate marrow changes are present at L4-5 with a Schmorl's node. Marrow signal and vertebral body heights are otherwise normal. Conus medullaris and cauda equina: Conus extends to the L2-3 level. Conus and cauda equina appear normal. Paraspinal and other soft tissues: Limited imaging the abdomen is unremarkable. There is no significant adenopathy. 2.6 cm cyst is present at the lower pole of the right kidney. A solid lesion at the upper pole of the right kidney measures 10 mm, not well seen on the most recent CT scan. Previously noted hydronephrosis is mostly resolved. There is no significant adenopathy. Disc levels:  L1-2: Negative. L2-3: Negative. L3-4: Negative. L4-5: There is uncovering of a broad-based disc protrusion. The central canal is patent. Facet hypertrophy contributes to mild foraminal narrowing bilaterally, worse on the left. L5-S1: Minimal rightward disc bulging is present without significant  stenosis. IMPRESSION: 1. Mild foraminal narrowing bilaterally at L4-5 is worse on the left. 2. Chronic endplate changes at J1-8 with associated Schmorl's node. 3. Minimal rightward disc bulging without significant stenosis. 4. 10 mm solid lesion at the upper pole of the right kidney was not well seen on recent CT scans. Small renal cell carcinoma is not excluded. Recommend dedicated MRI of the kidneys as the patient's condition allows. 5. Stable cyst at the lower pole of the right kidney. Electronically Signed   By: San Morelle M.D.   On: 07/28/2018 15:27   Ct Abdomen Pelvis W Contrast  Result Date: 08/09/2018 CLINICAL DATA:  Acute abdominal pain. Fever. EXAM: CT ABDOMEN AND PELVIS WITH CONTRAST TECHNIQUE: Multidetector CT imaging of the abdomen and pelvis was performed using the standard protocol following bolus administration of intravenous contrast. CONTRAST:  150m ISOVUE-300 IOPAMIDOL (ISOVUE-300) INJECTION 61% COMPARISON:  CT 09/05/2017, multiple priors FINDINGS: Lower chest: Small pleural effusions. Associated bibasilar atelectasis. Mild cardiomegaly. Hepatobiliary: Prominent liver spanning 22.8 cm cranial caudal. Distended gallbladder with possible punctate gallstone. No biliary dilatation. Pancreas: No ductal dilatation or inflammation. Spleen: Chronic splenomegaly. Adrenals/Urinary Tract: No adrenal nodule. Bilateral hydroureteronephrosis. Ureter is dilated to the bladder insertion. Bilateral perinephric edema. Only minimal excretion on delayed phase imaging. Simple cyst from the lower right kidney. No urolithiasis. Bladder physiologically distended with multiple bladder diverticula and bladder wall  thickening. There is perivesicular edema. Stomach/Bowel: Stomach is within normal limits. Appendix appears normal. No evidence of bowel wall thickening, distention, or inflammatory changes. Colonic diverticulosis without diverticulitis. Surgical clip in the sigmoid colon. Vascular/Lymphatic: Aortic atherosclerosis without aneurysm. No enlarged lymph nodes in the abdomen or pelvis. Reproductive: Prominent prostate gland spans 4.9 cm. Other: No free air or ascites. Small fat containing umbilical hernia. Small skin/subcutaneous lesion in the left groin measures 2.4 x 1.5 cm, previously 1.7 x 1.0 cm. Musculoskeletal: Abnormal marrow with diffuse sclerosis in innumerable lytic lesions, unchanged from priors. Degenerative disc disease at L4-L5 appears similar to prior. IMPRESSION: 1. Bilateral hydroureteronephrosis, the ureters are dilated to the bladder insertion. Bladder wall thickening with multiple bladder diverticula and perivesicular edema. Findings consistent with chronic bladder outlet obstruction with cystitis and urinary tract infection. 2. Distended gallbladder with possible gallstone. If there is clinical concern for hepatic biliary pathology, recommend right upper quadrant ultrasound. 3. Right groin skin/subcutaneous nodule has slightly increased in size from prior but has been present for multiple years. This should be amenable to direct inspection with physical exam. 4. Chronic findings include colonic diverticulosis and hepatosplenomegaly. 5. 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 Electronically Signed   By: MKeith RakeM.D.   On: 08/05/2018 23:41   [place]  Result Date: 07/26/2018 CLINICAL DATA:  78year old male for follow-up of hydronephrosis. EXAM: RENAL / URINARY TRACT ULTRASOUND COMPLETE COMPARISON:  07/20/2018 CT FINDINGS: Right Kidney: Renal measurements: 11.1 x 4.9 x 5.9 cm = volume: 165 mL.  Increased echogenicity noted. No mass or hydronephrosis visualized. Left Kidney: Renal measurements: 12.4 x 6.6 x 6.7 cm = volume: 288 mL. Increased echogenicity noted. No mass or hydronephrosis visualized. Bladder: A Foley catheter within the bladder is noted. IMPRESSION: 1. Resolution of bilateral hydronephrosis since 08/14/2018. 2. Increased renal echogenicity within both kidneys compatible with medical renal disease. 3. Foley catheter within the bladder. Electronically Signed   By: JMargarette CanadaM.D.   On: 07/26/2018 21:01   Dg Chest Port 1 View  Result Date: 08/18/2018  CLINICAL DATA:  Respiratory failure EXAM: PORTABLE CHEST 1 VIEW COMPARISON:  08/15/2018 FINDINGS: Support devices are stable. Cardiomegaly with vascular congestion. Left perihilar and bibasilar airspace opacities have worsened since prior study. This could reflect edema or atelectasis. No visible effusions or acute bony abnormality. IMPRESSION: Left perihilar and bibasilar airspace opacities, slightly worsening, possibly edema or atelectasis. Electronically Signed   By: Rolm Baptise M.D.   On: 08/18/2018 07:54   Dg Chest Port 1 View  Result Date: 08/15/2018 CLINICAL DATA:  Central line placement. EXAM: PORTABLE CHEST 1 VIEW COMPARISON:  08/15/2018 at 6:33 a.m. FINDINGS: New left internal jugular central venous line has its tip projecting in the mid superior vena cava. No pneumothorax. No change in lung aeration.  No new lung abnormalities. Endotracheal tube and nasal/orogastric tube are stable and well positioned. IMPRESSION: 1. Left internal jugular central venous line tip projects in the mid superior vena cava. No pneumothorax. 2. No other change from the earlier exam. Electronically Signed   By: Lajean Manes M.D.   On: 08/15/2018 16:09   Dg Chest Port 1 View  Result Date: 08/15/2018 CLINICAL DATA:  Respiratory failure.  Intubated patient. EXAM: PORTABLE CHEST 1 VIEW COMPARISON:  Single-view of the chest 08/14/2018 and 08/12/2018.  FINDINGS: NG tube, right PICC and ET tube are unchanged. Hazy airspace disease in the left mid lung zone is improved since the most recent study. Heart size is enlarged. Atherosclerosis noted. No pneumothorax or pleural effusion. IMPRESSION: Improved airspace disease in left mid lung zone. No new abnormality. Electronically Signed   By: Inge Rise M.D.   On: 08/15/2018 07:58   Dg Chest Port 1 View  Result Date: 08/14/2018 CLINICAL DATA:  Respiratory failure. EXAM: PORTABLE CHEST 1 VIEW COMPARISON:  Radiograph August 12, 2018. FINDINGS: Stable cardiomegaly. Endotracheal and nasogastric tubes are unchanged in position. Right-sided PICC line is unchanged in position. No pneumothorax is noted. Minimal right basilar subsegmental atelectasis is noted. Increased airspace opacity is seen in lingular segment of left upper lobe concerning for worsening pneumonia or atelectasis. No significant pleural effusion is noted. Increased sclerosis of visualized skeleton is noted consistent with renal osteodystrophy. IMPRESSION: Increased left lingular opacity is noted suggesting worsening pneumonia or atelectasis. Stable support apparatus. Electronically Signed   By: Marijo Conception, M.D.   On: 08/14/2018 07:16   Dg Chest Port 1 View  Result Date: 08/12/2018 CLINICAL DATA:  Hypoxia EXAM: PORTABLE CHEST 1 VIEW COMPARISON:  August 11, 2018 FINDINGS: Endotracheal tube tip is 2.5 cm above the carina. Central catheter tip is in the superior vena cava. Nasogastric tube tip and side port below the diaphragm. No pneumothorax. There is patchy atelectasis in each lower lobe. There is no frank edema or consolidation. Heart is upper normal in size with pulmonary vascularity normal. No adenopathy. There is aortic atherosclerosis. Bones are diffusely osteoporotic. Multiple lucent areas are noted in the bones. IMPRESSION: Tube and catheter positions as described without pneumothorax. Areas of atelectatic change in the lower lung zones.  No frank edema or consolidation. Stable cardiac silhouette. There is osteoporosis. Multiple lucent areas in the bony structures raise concern for possible degree of underlying multiple myeloma. Appropriate laboratory correlation in this regard advised. Aortic Atherosclerosis (ICD10-I70.0). Electronically Signed   By: Lowella Grip III M.D.   On: 08/12/2018 07:11   Dg Chest Port 1 View  Result Date: 08/11/2018 CLINICAL DATA:  Acute respiratory failure. EXAM: PORTABLE CHEST 1 VIEW COMPARISON:  08/10/2018 and 08/18/2017 FINDINGS: Endotracheal tube is roughly  5.1 cm above the carina. Nasogastric tube extends into the abdomen but the tip is beyond the image. PICC line tip in the lower SVC and stable. Heart size is stable and within normal limits. Hazy reticular lung densities are unchanged and chronic since 2019. Chronic elevation of the right hemidiaphragm. Subsegmental atelectasis at the right lung base has improved or resolved. There continues to be volume loss at the right lung base. Improved aeration at the left lung base. Negative for a pneumothorax. Bones are diffusely heterogeneous but this is a chronic finding. IMPRESSION: Visualized support apparatuses are appropriately positioned. Slightly improved aeration at the lung bases. Electronically Signed   By: Markus Daft M.D.   On: 08/11/2018 08:17   Dg Chest Port 1 View  Result Date: 08/10/2018 CLINICAL DATA:  Acute on chronic respiratory failure with hypoxia. EXAM: PORTABLE CHEST 1 VIEW COMPARISON:  One-view chest x-ray 08/09/2018. FINDINGS: Heart size is exaggerated by low lung volumes. Moderate pulmonary vascular congestion is again noted. Linear atelectasis is present in the right middle lobe. Endotracheal tube is stable. Right-sided PICC line is stable. IMPRESSION: 1. Low lung volumes. 2. Borderline cardiomegaly with moderate pulmonary vascular congestion. 3. Stable bibasilar airspace disease. Electronically Signed   By: San Morelle M.D.    On: 08/10/2018 07:48   Dg Chest Port 1 View  Result Date: 08/09/2018 CLINICAL DATA:  Acute on chronic respiratory failure EXAM: PORTABLE CHEST 1 VIEW COMPARISON:  08/07/2018 FINDINGS: Endotracheal tube in good position. Right arm PICC tip in the SVC unchanged. Bibasilar airspace disease unchanged. Negative for vascular congestion or effusion. IMPRESSION: Endotracheal tube in good position. Bibasilar airspace disease unchanged. Possible pneumonia. Electronically Signed   By: Franchot Gallo M.D.   On: 08/09/2018 07:04   Dg Chest Port 1 View  Result Date: 08/07/2018 CLINICAL DATA:  Acute respiratory failure. EXAM: PORTABLE CHEST 1 VIEW COMPARISON:  08/03/2018 FINDINGS: Patient is slightly rotated to the right. No tracheal tube and nasogastric tube unchanged. Right-sided PICC line has been pulled back slightly and has tip over the SVC. Lungs are adequately inflated with linear atelectasis over the right base. Mild hazy interstitial prominence in the perihilar/infrahilar regions without significant change likely mild interstitial edema although infection is possible. No evidence of effusion. Cardiomediastinal silhouette and remainder of the exam is unchanged. IMPRESSION: Stable hazy interstitial prominence over the perihilar/infrahilar regions which may be due to interstitial edema or infection. Linear atelectasis right base. Tubes and lines as described. Electronically Signed   By: Marin Olp M.D.   On: 08/07/2018 07:11   Dg Chest Port 1 View  Result Date: 08/04/2018 CLINICAL DATA:  Respiratory failure. EXAM: PORTABLE CHEST 1 VIEW COMPARISON:  08/03/2018. FINDINGS: Endotracheal tube, NG tube, right PICC line stable position. Heart size stable. Low lung volumes with bibasilar atelectasis/infiltrates, progressed from prior exam. Mild atelectatic changes right upper lung. No pleural effusion or pneumothorax. IMPRESSION: 1.  Lines and tubes in stable position. 2. Low lung volumes with bibasilar  atelectasis/infiltrates, progressed from prior exam. Mild atelectatic changes right upper lung. Electronically Signed   By: Marcello Moores  Register   On: 08/04/2018 06:21   Dg Chest Port 1 View  Result Date: 08/03/2018 CLINICAL DATA:  Acute respiratory failure EXAM: PORTABLE CHEST 1 VIEW COMPARISON:  Yesterday FINDINGS: Endotracheal tube tip between the clavicular heads and carina. The orogastric tube tip is in the stomach. Right-sided PICC with tip at the SVC. Interstitial coarsening similar to prior. No focal opacity. Generalized osteopenia. Artifact from EKG leads. IMPRESSION: 1.  Unremarkable hardware positioning. 2. Stable, symmetric inflation. Electronically Signed   By: Monte Fantasia M.D.   On: 08/03/2018 07:13   Dg Chest Port 1 View  Result Date: 08/02/2018 CLINICAL DATA:  Hypoxia.  Chronic renal disease. EXAM: PORTABLE CHEST 1 VIEW COMPARISON:  August 01, 2018 FINDINGS: Endotracheal tube tip is 3.4 cm above the carina. Nasogastric tube tip and side port are below the diaphragm. No pneumothorax. There is mild atelectatic change in the right base. The lungs elsewhere are clear. Heart size and pulmonary vascularity are normal. No adenopathy. There is aortic atherosclerosis. Osteopenia again noted, likely due to osteomalacia. IMPRESSION: Tube positions as described without pneumothorax. Right base atelectasis. No edema or consolidation. Stable cardiac silhouette. Aortic Atherosclerosis (ICD10-I70.0). Electronically Signed   By: Lowella Grip III M.D.   On: 08/02/2018 07:33   Dg Chest Port 1 View  Result Date: 08/01/2018 CLINICAL DATA:  Endotracheal tube.  Shortness of breath EXAM: PORTABLE CHEST 1 VIEW COMPARISON:  Three days ago FINDINGS: Endotracheal tube tip between the clavicular heads and carina. The orogastric tube reaches the stomach. Improving inflation. Residual streaky density may reflect atelectasis. No edema or effusion. No pneumothorax. Prominent osteopenia IMPRESSION: 1. Improving  aeration. 2. Stable hardware positioning. Electronically Signed   By: Monte Fantasia M.D.   On: 08/01/2018 06:53   Portable Chest X-ray  Result Date: 07/29/2018 CLINICAL DATA:  Intubation. EXAM: PORTABLE CHEST 1 VIEW COMPARISON:  07/29/2018. FINDINGS: Endotracheal tube tip noted with tip 4 cm above the carina. NG tube noted with tip projected over the distal stomach. Mild gastric distention noted. Bilateral interstitial prominence and bibasilar atelectasis again noted. No pleural effusion or pneumothorax. Stable cardiomegaly. No acute bony abnormality. Thoracic spine scoliosis. IMPRESSION: 1. Endotracheal tube noted with its tip 4 cm above the carina. NG tube noted with tip projected over the distal stomach. Mild gastric distention noted. 2. Bilateral interstitial prominence and bibasilar atelectasis again noted. 3.  Stable cardiomegaly. Electronically Signed   By: Marcello Moores  Register   On: 07/29/2018 15:06   [place] Ekg Site Rite  Result Date: 08/02/2018 If Site Rite image not attached, placement could not be confirmed due to current cardiac rhythm.  US Abdomen Limited Ruq  Result Date: 07/28/2018 CLINICAL DATA:  Fever. Possible sludge seen in the gallbladder on CT. EXAM: ULTRASOUND ABDOMEN LIMITED RIGHT UPPER QUADRANT COMPARISON:  CT, 08/14/2018 FINDINGS: Gallbladder: 7 mm stone in the dependent gallbladder. No wall thickening. No pericholecystic fluid. Common bile duct: Diameter: 3 mm Liver: No focal lesion identified. Within normal limits in parenchymal echogenicity. Portal vein is patent on color Doppler imaging with normal direction of blood flow towards the liver. IMPRESSION: 1. No acute findings. 2. Single visualized gallstone. No evidence of acute cholecystitis. No other abnormality. Electronically Signed   By: Lajean Manes M.D.   On: 07/28/2018 13:50   Dg Fluoro Guide Lumbar Puncture  Result Date: 07/28/2018 CLINICAL DATA:  78 year old male with fever and altered mental status. EXAM: DIAGNOSTIC  LUMBAR PUNCTURE UNDER FLUOROSCOPIC GUIDANCE FLUOROSCOPY TIME:  Fluoroscopy Time:  0.7 minutes Radiation Exposure Index (if provided by the fluoroscopic device): 4.9 mGy PROCEDURE: Informed consent was obtained from the patient prior to the procedure, including potential complications of headache, allergy, and pain. With the patient prone, the lower back was prepped with Betadine. 1% Lidocaine was used for local anesthesia. Lumbar puncture was performed at the L3-L4 level using a 20 gauge needle with return of clear CSF with an opening pressure of 12 cm water. 13.5  ml of CSF were obtained for laboratory studies. The patient tolerated the procedure well and there were no apparent complications. IMPRESSION: 1. Successful uncomplicated fluoroscopic guided lumbar puncture, as above. Electronically Signed   By: Vinnie Langton M.D.   On: 07/28/2018 15:34    Microbiology Recent Results (from the past 240 hour(s))  Culture, respiratory (non-expectorated)     Status: None   Collection Time: 08/14/18  1:21 PM  Result Value Ref Range Status   Specimen Description TRACHEAL ASPIRATE  Final   Special Requests NONE  Final   Gram Stain   Final    NO SQUAMOUS EPITHELIAL CELLS PRESENT FEW WBC PRESENT,BOTH PMN AND MONONUCLEAR FEW GRAM NEGATIVE RODS RARE GRAM POSITIVE COCCI IN CLUSTERS Performed at [facility], [address] [place], [place] [address]    Culture FEW PROVIDENCIA STUARTII  Final   Report Status 08/16/2018 FINAL  Final   Organism ID, Bacteria PROVIDENCIA STUARTII  Final      Susceptibility   Providencia stuartii - MIC*    AMPICILLIN >=32 RESISTANT Resistant     CEFAZOLIN >=64 RESISTANT Resistant     CEFEPIME 32 RESISTANT Resistant     CEFTAZIDIME >=64 RESISTANT Resistant     CEFTRIAXONE 2 SENSITIVE Sensitive     CIPROFLOXACIN >=4 RESISTANT Resistant     GENTAMICIN RESISTANT Resistant     IMIPENEM 1 SENSITIVE Sensitive     TRIMETH/SULFA <=20 SENSITIVE Sensitive      AMPICILLIN/SULBACTAM >=32 RESISTANT Resistant     PIP/TAZO 32 INTERMEDIATE Intermediate     * FEW PROVIDENCIA STUARTII    Lab Basic Metabolic Panel: Recent Labs  Lab 08/16/18 0500 08/16/18 1600 08/17/18 0305 08/17/18 1715 08/18/18 0354  NA 134* 133* 133* 135 136  135  K 3.7 3.9 4.0 3.7 3.5  3.5  CL 104 102 100 101 102  102  CO2 15* 19* 21* '23 25  24  '$ GLUCOSE 140* 129* 116* 146* 237*  243*  BUN 132* >100* 72* 50* 38*  39*  CREATININE 2.55* 2.13* 1.61* 1.23 1.02  0.94  CALCIUM 7.5* 7.7* 8.0* 8.0* 7.7*  7.8*  MG 2.0  --  2.2  --  2.3  PHOS 4.2 3.4 2.5 2.2* 1.7*   Liver Function Tests: Recent Labs  Lab 08/16/18 0500 08/16/18 1600 08/17/18 0305 08/17/18 1715 08/18/18 0354  ALBUMIN <1.0* 1.1* 1.1* 1.1* 1.0*   No results for input(s): LIPASE, AMYLASE in the last 168 hours. No results for input(s): AMMONIA in the last 168 hours. CBC: Recent Labs  Lab 08/16/18 0500 08/17/18 0305 08/18/18 0354  WBC 32.1* 37.6* 45.6*  HGB 7.3* 7.8* 7.0*  HCT 24.0* 23.7* 22.2*  MCV 81.9 79.0* 79.3*  PLT 144* 147* 135*   Cardiac Enzymes: No results for input(s): CKTOTAL, CKMB, CKMBINDEX, TROPONINI in the last 168 hours. Sepsis Labs: Recent Labs  Lab 08/16/18 0500 08/17/18 0305 08/18/18 0354  WBC 32.1* 37.6* 45.6*    Procedures/Operations  Mechanical ventilation   Amity Roes 08/22/2018, 10:04 AM

## 2018-09-16 NOTE — Progress Notes (Signed)
[place] [facility] services called referral number [phone] . Will call with time of death or decompensation prior to death.

## 2018-09-16 NOTE — Progress Notes (Signed)
  Palliative medicine progress note  Patient's family coming today to pursue liberation from the vent.  His 4 children have arrived to the unit.  Patient has been in the hospital 27 days and despite all aggressive measures being pursued he unfortunately is nearing end-of-life and family has elected comfort measures in keeping with his wishes.  Explained process of liberating people from the ventilator in terms of usage of medication, rationale and what to expect.   Plan  DNR/DNI Extubation to comfort care Will discontinue antibiotics and pressors and other non-comfort related medications Start morphine infusion at 1 mg an hour with ability to uptitrate to 10 mg an hour for respiratory comfort.  Bolus available every 20 minutes We will start scheduled Robinul for secretion management Scheduled Ativan for anxiety as well as management of seizures  Prognosis: Attempted to prepare family that once ventilator is removed death could be eminent or hours from now.  Disposition: Anticipate hospital death.  If patient were to stabilize he would qualify for residential hospice.  Thank you, Romona Curls, NP Time spent: 50% of time spent in counseling and coordination of care

## 2018-09-16 NOTE — Progress Notes (Signed)
NAME:  Juan [name], MRN:  [id number], DOB:  1940-05-31, LOS: 46 ADMISSION DATE:  07/31/2018, CONSULTATION DATE:  3/13 REFERRING MD:  Lupita Leash, CHIEF COMPLAINT:  Acute encephalopathy and worsening mental status    Brief History   78 year old male patient admitted initially on 3/7 with sepsis secondary secondary to pseudomonas urinary tract infection with associated acute on chronic renal failure secondary to obstructive uropathy.  Over the course of his hospitalization developed progressive encephalopathy, with concern for nonconvulsive status on 3/12.  This was felt possibly secondary to neurotoxicity from cefepime.  Intubated for airway protection. He was initially under propofol, and transition to Versed due to severe bradycardia on 3/16, Versed was stopped 3/17.  Past Medical History  Chronic urinary retention with prostate hypertrophy, prior Pseudomonas urinary tract infections, stage III chronic kidney disease, moderate to severe pulmonary hypertension, chronic diastolic heart failure, myelodysplasia with myelodysplastic syndrome, chronic back pain  Significant Hospital Events   3/7: Admitted with presumptive diagnosis of urinary tract infection 3/9: worsening renal failure, seen by urology, Foley catheter placed 3/12: Worsening mental status, EEG worrisome for possible nonconvulsive status.  Felt possibly due to neurotoxicity from cefepime this was discontinued and Zosyn was started.  lumbar puncture performed, for possible meningitis.  Infectious disease consulted, neurology consulted, started on Keppra, moved to [facility] for continuous EEG monitoring 3/13 remains unresponsive, developed worsening hypoxia staph from prior LP unremarkable without evidence of meningitis.  Pulmonary asked to evaluate 3/17 Episode of bradycardia overnight in spite of stopping propofol hence dopamine started. Episode of mucus plugging 3/17 versed stopped 3/24>> No improvement in neuro status despite off sedation s 3/26 >>  overnight with low hgb s/p transfusion, Afib w/RVR, hypotensive requiring pressors, oliguria 3/28 -A Fib RVR -Amio bolus x 1 , weaning pressors  4/01 - CRRT trial to end today  4/02 family conference with palliative care >> DNR with plans for comfort / terminal extubation on 4/4  Consults:  ID Neurology Urology PCCM Nephrology  PMT   Procedures:  3/12 Fluoro-guided LP performed at L3-L4. Opening pressure 12 cm H20. 13.5 ml clear CSF obtained 3/13 ETT >> 3/17 R DL PICC >> 3/31 L IJ trialysis >>  Significant Diagnostic Tests:  CT abdomen pelvis 3/7 showed bilateral hydronephrosis with bladder wall thickening and multiple bladder diverticuli suggesting chronic bladder outlet obstruction with cystitis Renal US 3/10  resolved bilateral hydronephrosis  MRI brain 3/12: Truncated and motion degraded examination without visible acute abnormality. MRI L spine 3/12:1. Mild foraminal narrowing bilaterally at L4-5 is worse on the left.2. Chronic endplate changes at F0-2 with associated Schmorl's node. 3. Minimal rightward disc bulging without significant stenosis.4. 10 mm solid lesion at the upper pole of the right kidney was not well seen on recent CT scans. Small renal cell carcinoma is not excluded. Recommend dedicated MRI of the kidneys as the patient's condition allows. 5. Stable cyst at the lower pole of the right kidney. EEG 3/12 frequent periodic discharges concerning for triphasic versus epileptiform GPD's 3/17 EEG >>burst suppression, triphasics GPD's US abdomen 3/12: 1. No acute findings. 2. Single visualized gallstone. No evidence of acute cholecystitis. No other abnormality. MRI brain 3/21 neg CT Head 3/24 No acute infarct, no hemorrhage no edema. Small lipoma between lateral ventricles, mild periventricular small vessel disease  Micro Data:  UC 3/7 pseudomonas (cipro resistant) Urine cx 3/12 >> pseudomonas 3/16 urine >> pseudomonas resp 3/18 >> providencia,  multidrug-resistant (S ceftriaxone) 3/29 Trach asp>> few providencia stuartii  Antimicrobials:  Cefepime 3/7>>3/12 Zosyn  3/12>>> 3/19 zyvox 3/13>>>3/15 ceftx 3/21 (HCAP ) >> 3/27 Zosyn 3/29 >>3/31 Meropenum 3/31 >>  Interim history/subjective:  No acute events.  Remains unresponsive.  Objective   Blood pressure (!) 113/54, pulse 81, temperature 100.2 F (37.9 C), temperature source Core, resp. rate 19, height 6\' 3"  (1.905 m), weight 104.3 kg, SpO2 94 %.    Vent Mode: PRVC FiO2 (%):  [40 %] 40 % Set Rate:  [16 bmp] 16 bmp Vt Set:  [670 mL] 670 mL PEEP:  [5 cmH20] 5 cmH20 Pressure Support:  [8 cmH20] 8 cmH20 Plateau Pressure:  [14 cmH20-23 cmH20] 23 cmH20   Intake/Output Summary (Last 24 hours) at 12-Sep-2018 1615 Last data filed at 2018-09-12 1230 Gross per 24 hour  Intake 1644.84 ml  Output 700 ml  Net 944.84 ml   Filed Weights   08/17/18 0500 08/18/18 0500 08/19/18 0500  Weight: 106.4 kg 103.8 kg 104.3 kg   Examination:  General: Adult male, unresponsive. Neuro: Unresponsive. HEENT: /AT. Sclerae anicteric.  ETT in place. Cardiovascular: RRR, no M/R/G.  Lungs: Respirations even and unlabored.  CTA bilaterally, No W/R/R. Abdomen: BS x 4, soft, NT/ND.  Musculoskeletal: No gross deformities, 2+ edema.  Skin: Intact, warm, no rashes.  Assessment & Plan:   Acute metabolic encephalopathy. Discussion: No improvement with renal replacement. Plan - family planning for comfort measures 4/5  Acute respiratory failure with hypoxia. Hx of OSA, pulmonary hypertension. Plan - full vent support until terminal extubation 4/5  Providencia HCAP. Pseudomonal UTI. Plan - continue ABx  Hypotension. -Switched to Midodrin   AKI. CKD 3. Obstructive uropathy. Discussion: He is not able to tolerate renal replacement due to a fib/hypotension. Plan - no further renal replacement  New onset A fib with RVR during renal replacement. Hypotension during renal replacement.  Plan - Converted to oral amiodarone.  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 Discussion: Innumerable lytic lesions on CT abdomen  Not candidate for further tx per oncology visit 06/10/18. Plan - No interventions required  Deep Tissue Sacral Injury (present prior to admission) Plan - wound care d/c   Best practice:  Diet: TF DVT prophylaxis: SCDs GI prophylaxis: H2B Mobilitity BR Code Status: DNR, plans for terminal extubation / comfort measures 4/4 Disposition:  ICU    Kipp Brood, MD [facility] ICU Physician [place]  Pager: [phone] Mobile: [phone] After hours: [phone].  Sep 12, 2018, 4:17 PM

## 2018-09-16 NOTE — Plan of Care (Signed)
End of life care/ comfort care   Currently on morphine drip

## 2018-09-16 NOTE — Plan of Care (Signed)
Elink:  Family video call with pt's daughter

## 2018-09-16 NOTE — Progress Notes (Signed)
   Sep 15, 2018 1700  Clinical Encounter Type  Visited With Patient;Family;Patient and family together  Visit Type Initial;Spiritual support;Critical Care  Referral From Nurse  Consult/Referral To Chaplain  Spiritual Encounters  Spiritual Needs Emotional;Prayer  Stress Factors  Patient Stress Factors Health changes;Major life changes  Family Stress Factors Major life changes;Loss of control;Health changes;Exhausted;Family relationships   Responded to page for family spiritual support. PT able to open eyes and not able to talk. Family (four adult children) at bedside. Family discussing what is best with PT care. I offered spiritual care with words of encouragement, ministry of presence, empathic listening, and prayer. Family was very thankful with the [place] visit. Chaplain available as needed. Chaplain Fidel Levy  [phone]

## 2018-09-16 DEATH — deceased

## 2020-06-22 IMAGING — RF LUMBAR PUNCTURE FLUORO GUIDE
2 series · 2 of 2 positions shown · non-contrast
Comparison: none

CLINICAL DATA: 77-year-old male with fever and altered mental
status.

[Series 1: cp_standard · 0.19mm/px · 1 of 1 slices shown (1 of 2)]
[im 1/1]
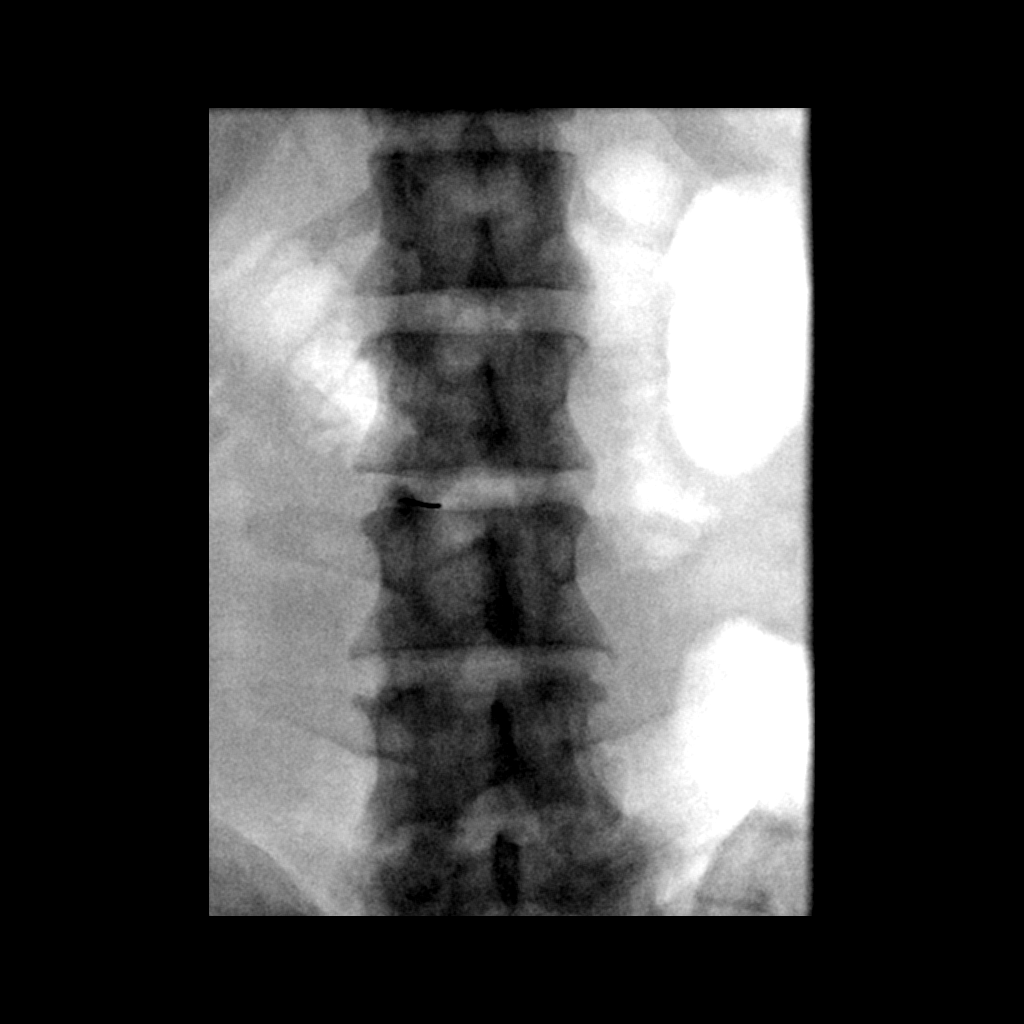

[Series 2: cp_standard · 0.19mm/px · 1 of 1 slices shown (2 of 2)]
[im 1/1]
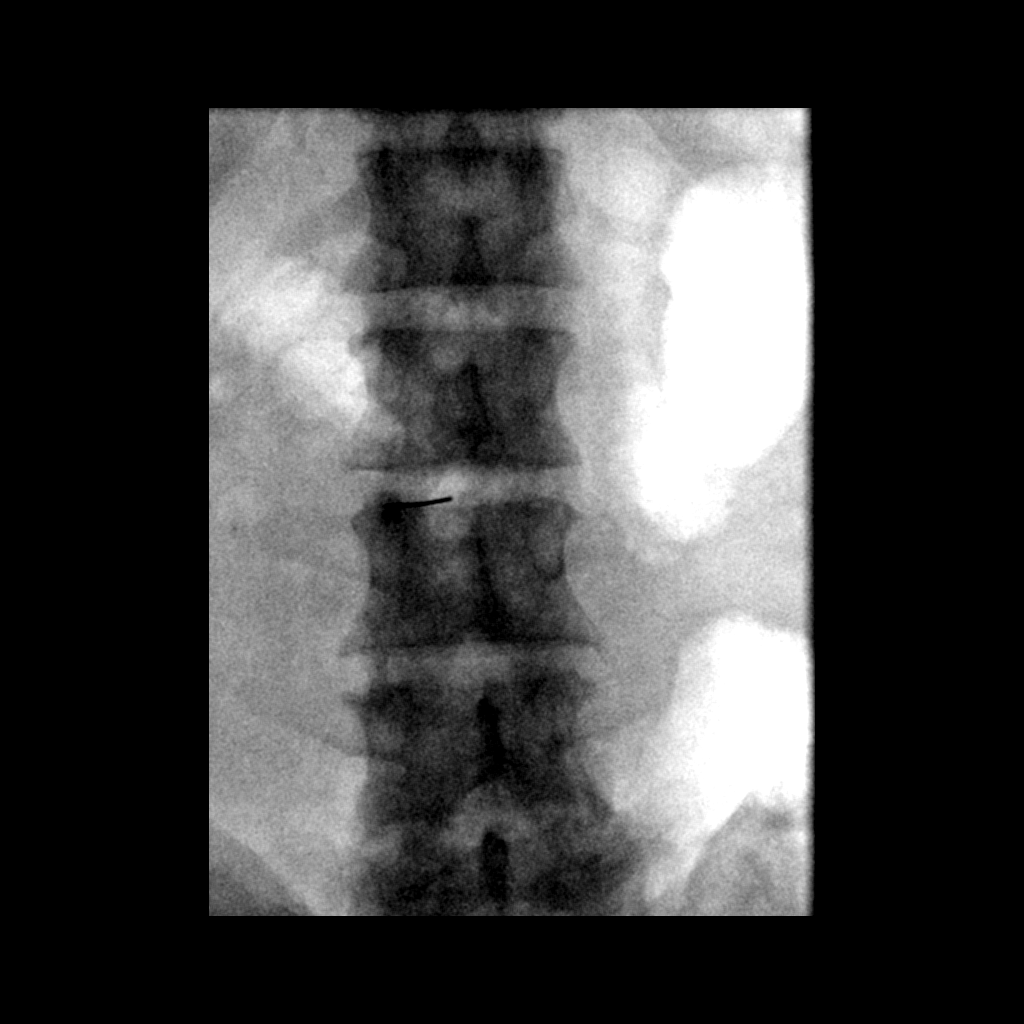

[2 of 2 positions shown; findings below may reference images not displayed]

EXAM:
DIAGNOSTIC LUMBAR PUNCTURE UNDER FLUOROSCOPIC GUIDANCE

FLUOROSCOPY TIME:  Fluoroscopy Time:  0.7 minutes

Radiation Exposure Index (if provided by the fluoroscopic device):
4.9 mGy

PROCEDURE:
Informed consent was obtained from the patient prior to the
procedure, including potential complications of headache, allergy,
and pain. With the patient prone, the lower back was prepped with
Betadine. 1% Lidocaine was used for local anesthesia. Lumbar
puncture was performed at the L3-L4 level using a 20 gauge needle
with return of clear CSF with an opening pressure of 12 cm water.
13.5 ml of CSF were obtained for laboratory studies. The patient
tolerated the procedure well and there were no apparent
complications.
IMPRESSION: 1. Successful uncomplicated fluoroscopic guided lumbar puncture, as
above.

## 2020-06-23 IMAGING — DX CHEST  1 VIEW
1 series · 1 of 1 positions shown · non-contrast
Comparison: 07/28/2018

CLINICAL DATA: Hypoxia

EXAM:
CHEST  1 VIEW

[chest ap]
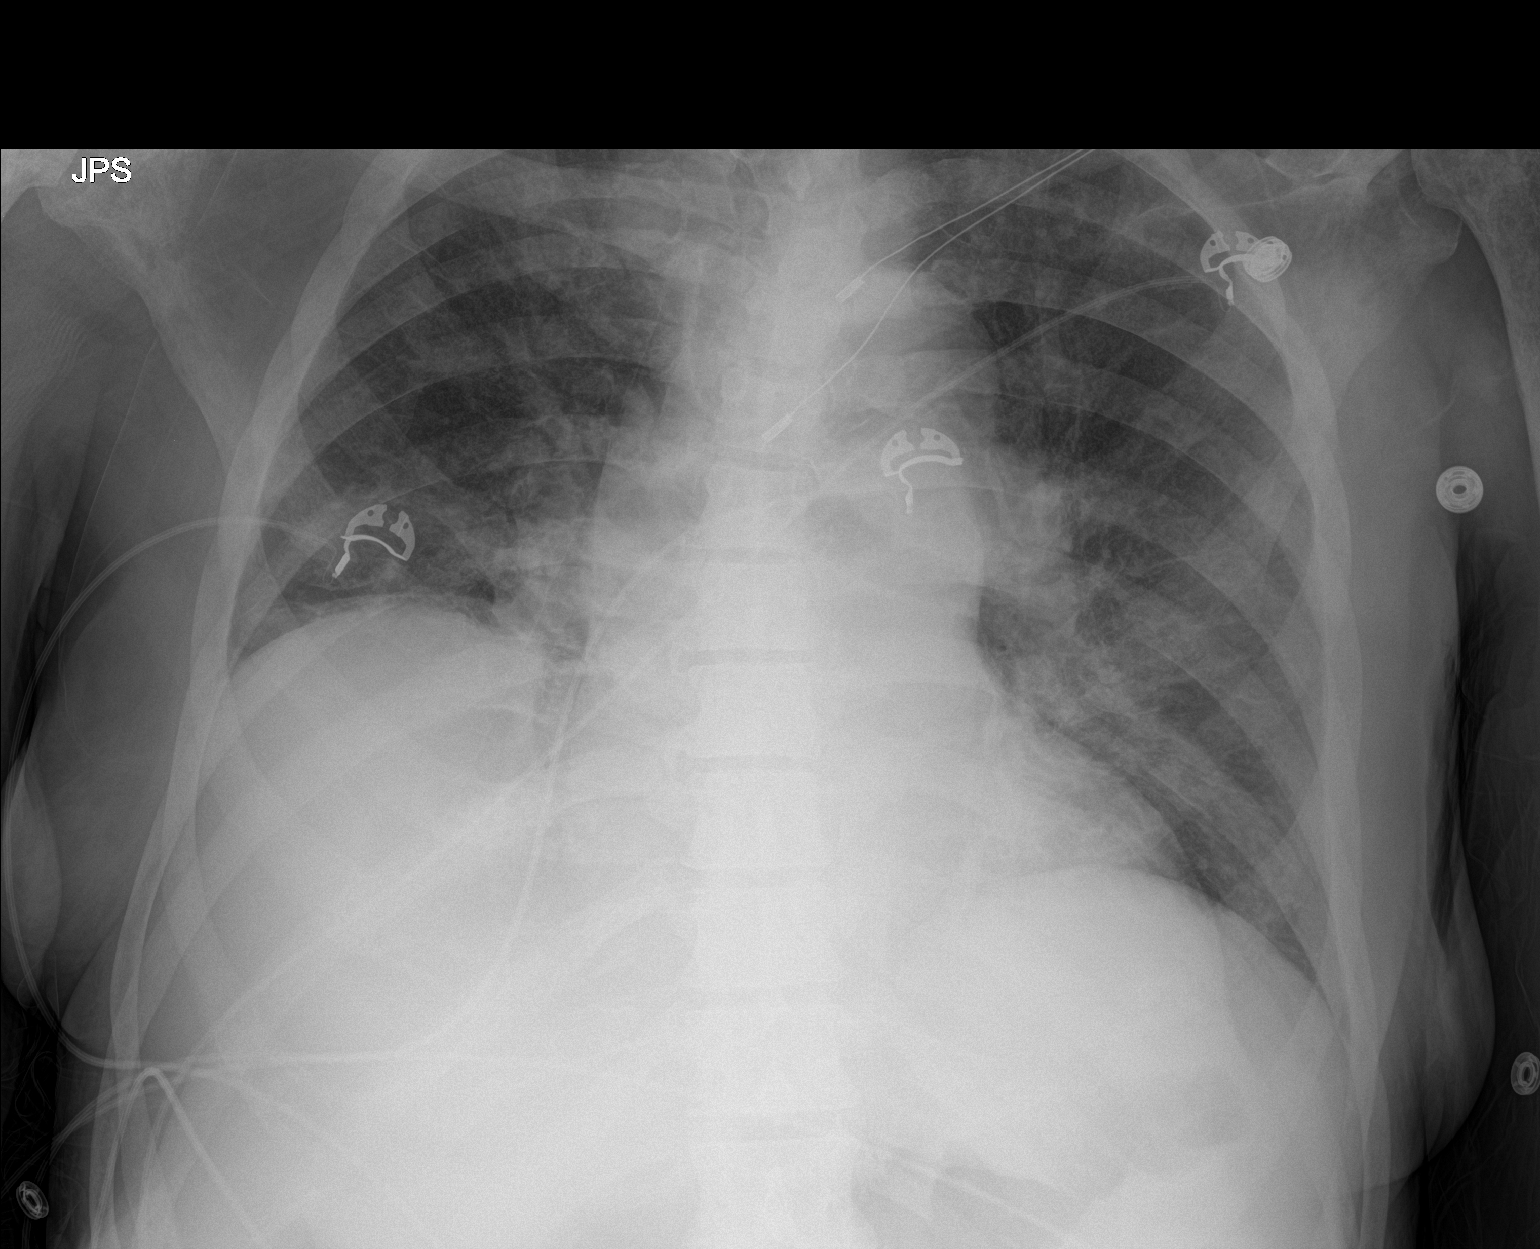

[1 of 1 positions shown; findings below may reference images not displayed]

FINDINGS: Normal heart size. Chronic asymmetric elevation of the right
hemidiaphragm. Bilateral interstitial and airspace opacities are
identified. No significant change in aeration a lungs compared with
previous exam. Diffuse abnormal sclerosis of the visualized osseous
structures concerning for widespread osseous metastatic disease.
IMPRESSION: No change in aeration a lungs compared with previous exam.

## 2020-06-23 IMAGING — DX PORTABLE CHEST - 1 VIEW
1 series · 1 of 1 positions shown · non-contrast
Comparison: 07/29/2018.

CLINICAL DATA: Intubation.

EXAM:
PORTABLE CHEST 1 VIEW

[chest ap]
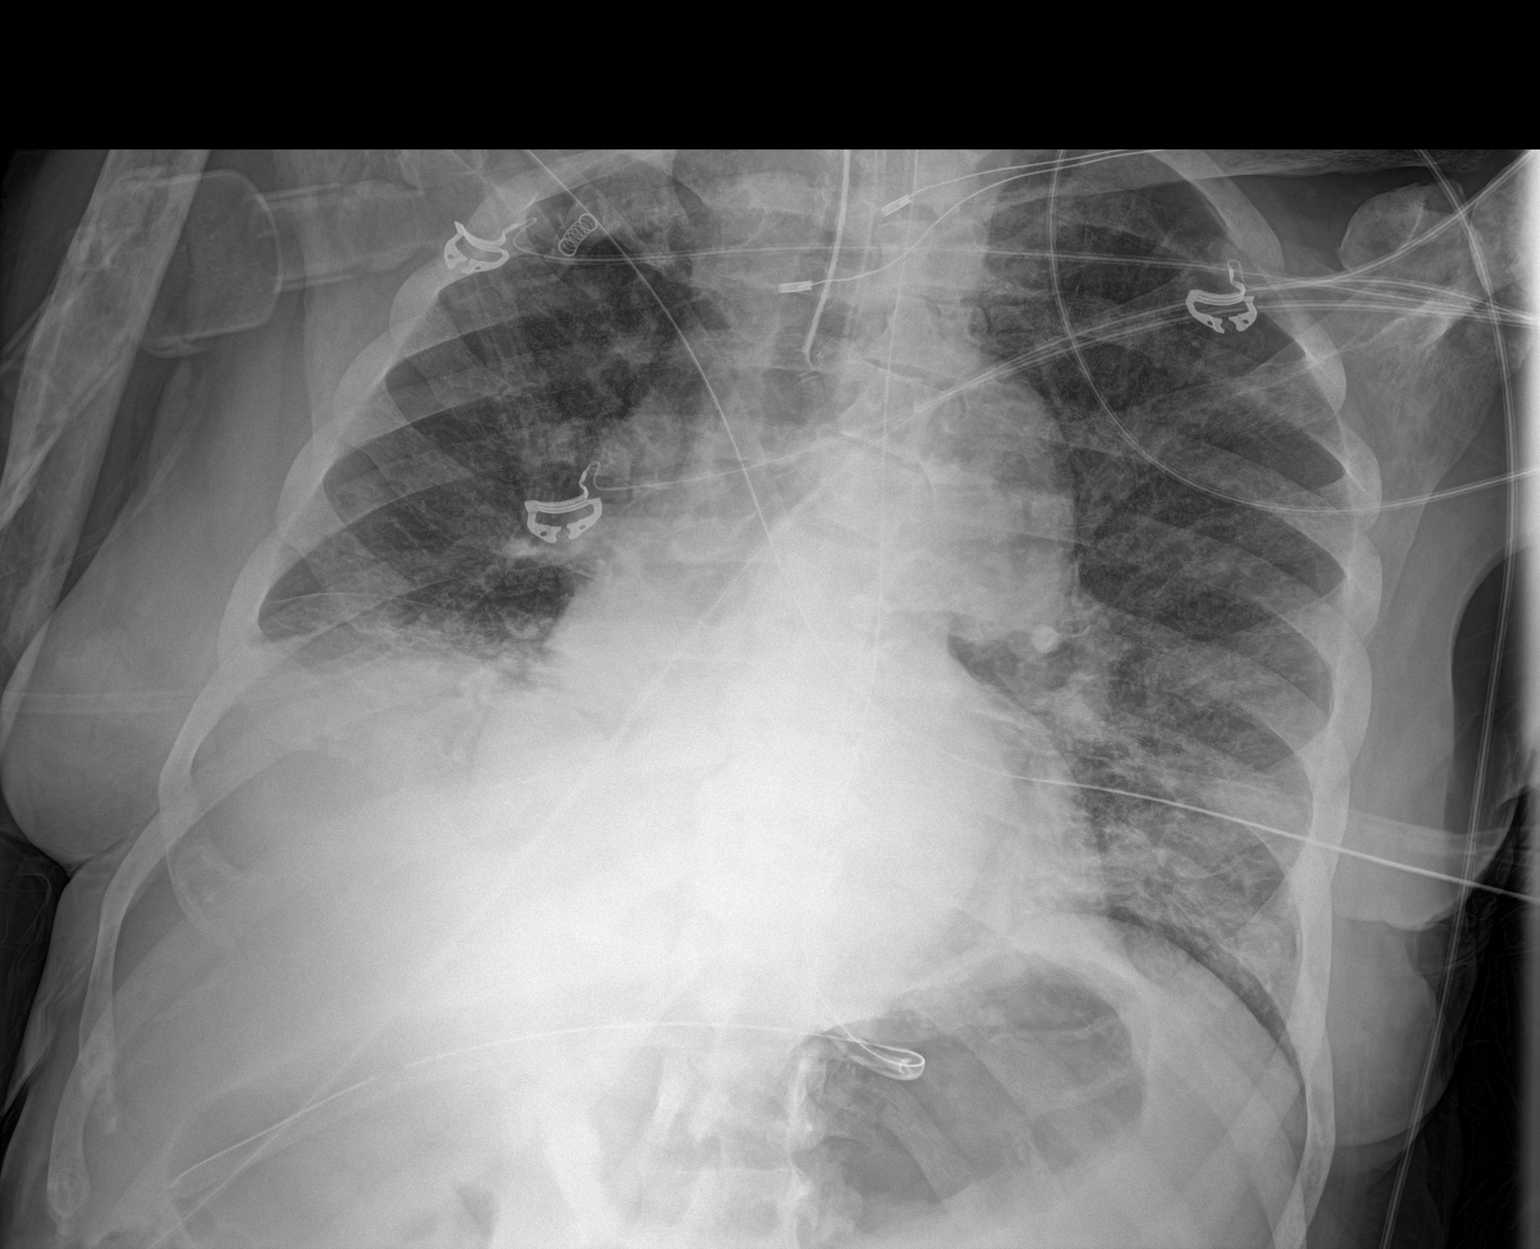

[1 of 1 positions shown; findings below may reference images not displayed]

FINDINGS: Endotracheal tube tip noted with tip 4 cm above the carina. NG tube
noted with tip projected over the distal stomach. Mild gastric
distention noted. Bilateral interstitial prominence and bibasilar
atelectasis again noted. No pleural effusion or pneumothorax. Stable
cardiomegaly. No acute bony abnormality. Thoracic spine scoliosis.
IMPRESSION: 1. Endotracheal tube noted with its tip 4 cm above the carina. NG
tube noted with tip projected over the distal stomach. Mild gastric
distention noted.

2. Bilateral interstitial prominence and bibasilar atelectasis again
noted.

3.  Stable cardiomegaly.

## 2020-06-26 IMAGING — DX PORTABLE CHEST - 1 VIEW
1 series · 1 of 1 positions shown · non-contrast
Comparison: Three days ago

CLINICAL DATA: Endotracheal tube.  Shortness of breath

EXAM:
PORTABLE CHEST 1 VIEW

[chest ap]
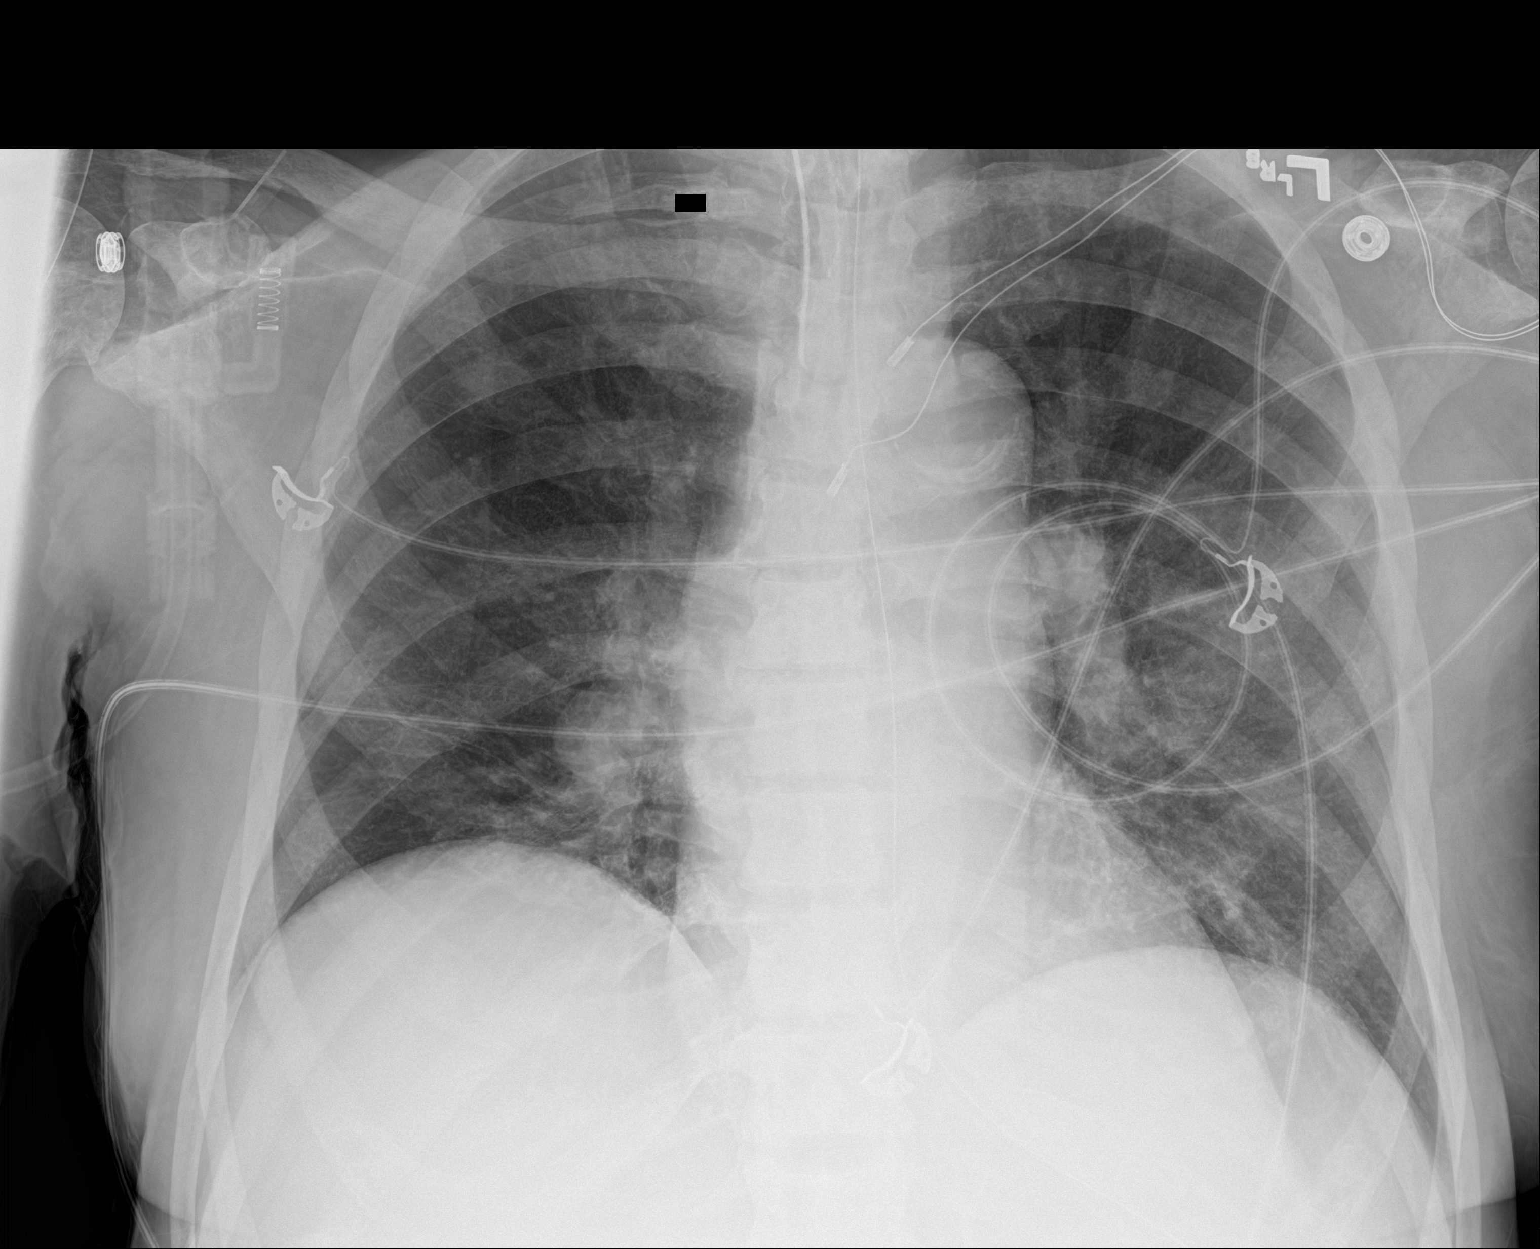

[1 of 1 positions shown; findings below may reference images not displayed]

FINDINGS: Endotracheal tube tip between the clavicular heads and carina. The
orogastric tube reaches the stomach. Improving inflation. Residual
streaky density may reflect atelectasis. No edema or effusion. No
pneumothorax. Prominent osteopenia
IMPRESSION: 1. Improving aeration.
2. Stable hardware positioning.

## 2020-06-27 IMAGING — DX PORTABLE CHEST - 1 VIEW
1 series · 1 of 1 positions shown · non-contrast
Comparison: August 01, 2018

CLINICAL DATA: Hypoxia.  Chronic renal disease.

EXAM:
PORTABLE CHEST 1 VIEW

[chest]
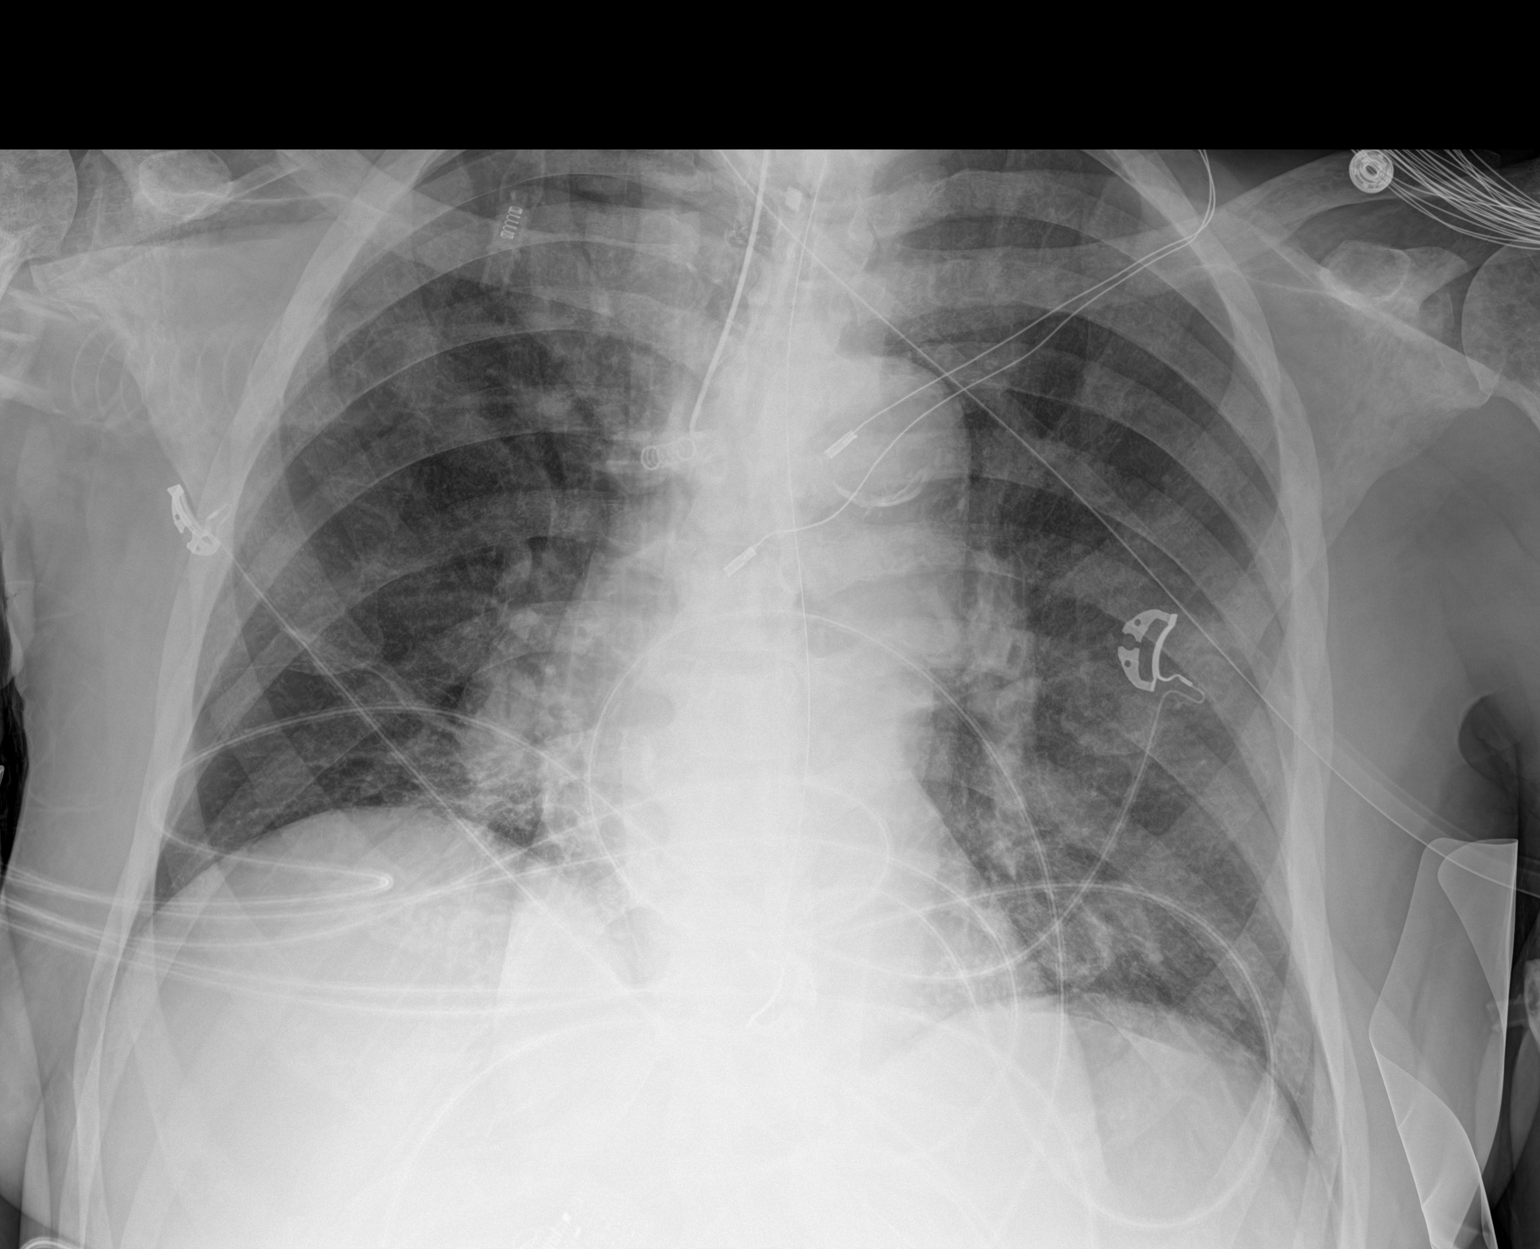

[1 of 1 positions shown; findings below may reference images not displayed]

FINDINGS: Endotracheal tube tip is 3.4 cm above the carina. Nasogastric tube
tip and side port are below the diaphragm. No pneumothorax. There is
mild atelectatic change in the right base. The lungs elsewhere are
clear. Heart size and pulmonary vascularity are normal. No
adenopathy. There is aortic atherosclerosis. Osteopenia again noted,
likely due to osteomalacia.
IMPRESSION: Tube positions as described without pneumothorax. Right base
atelectasis. No edema or consolidation. Stable cardiac silhouette.
Aortic Atherosclerosis ([id number]).
